# Patient Record
Sex: Male | Born: 1985 | Race: White | Hispanic: No | Marital: Single | Smoking: Current every day smoker
Health system: Southern US, Community
[De-identification: ages and names within clinical notes are randomized; demographics above are authoritative.]

## PROBLEM LIST (undated history)

## (undated) ENCOUNTER — Emergency Department (HOSPITAL_COMMUNITY): Admission: EM | Payer: Self-pay | Source: Home / Self Care

## (undated) DIAGNOSIS — S065XAA Traumatic subdural hemorrhage with loss of consciousness status unknown, initial encounter: Secondary | ICD-10-CM

## (undated) DIAGNOSIS — R569 Unspecified convulsions: Secondary | ICD-10-CM

## (undated) DIAGNOSIS — F419 Anxiety disorder, unspecified: Secondary | ICD-10-CM

## (undated) DIAGNOSIS — F209 Schizophrenia, unspecified: Secondary | ICD-10-CM

## (undated) DIAGNOSIS — F1010 Alcohol abuse, uncomplicated: Secondary | ICD-10-CM

## (undated) DIAGNOSIS — S065X9A Traumatic subdural hemorrhage with loss of consciousness of unspecified duration, initial encounter: Secondary | ICD-10-CM

## (undated) DIAGNOSIS — F4310 Post-traumatic stress disorder, unspecified: Secondary | ICD-10-CM

## (undated) DIAGNOSIS — F319 Bipolar disorder, unspecified: Secondary | ICD-10-CM

## (undated) DIAGNOSIS — Z21 Asymptomatic human immunodeficiency virus [HIV] infection status: Secondary | ICD-10-CM

## (undated) DIAGNOSIS — B20 Human immunodeficiency virus [HIV] disease: Secondary | ICD-10-CM

## (undated) DIAGNOSIS — K859 Acute pancreatitis without necrosis or infection, unspecified: Secondary | ICD-10-CM

## (undated) HISTORY — PX: OTHER SURGICAL HISTORY: SHX169

---

## 2005-07-15 ENCOUNTER — Other Ambulatory Visit: Payer: Self-pay

## 2005-07-15 ENCOUNTER — Ambulatory Visit: Payer: Self-pay | Admitting: Pediatrics

## 2005-12-08 ENCOUNTER — Inpatient Hospital Stay: Payer: Self-pay | Admitting: Internal Medicine

## 2005-12-08 ENCOUNTER — Other Ambulatory Visit: Payer: Self-pay

## 2013-05-04 ENCOUNTER — Emergency Department: Payer: Self-pay | Admitting: Unknown Physician Specialty
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
## 2014-09-26 ENCOUNTER — Emergency Department: Payer: Self-pay | Admitting: Internal Medicine

## 2014-09-26 LAB — URINALYSIS, COMPLETE
Bacteria: NONE SEEN
Bilirubin,UR: NEGATIVE
Blood: NEGATIVE
Glucose,UR: NEGATIVE mg/dL (ref 0–75)
Ketone: NEGATIVE
Leukocyte Esterase: NEGATIVE
Nitrite: NEGATIVE
Ph: 7 (ref 4.5–8.0)
Protein: NEGATIVE
RBC,UR: NONE SEEN /HPF (ref 0–5)
Specific Gravity: 1.001 (ref 1.003–1.030)
Squamous Epithelial: <1
WBC UR: NONE SEEN /HPF (ref 0–5)

## 2014-09-26 LAB — COMPREHENSIVE METABOLIC PANEL
Albumin: 4.2 g/dL (ref 3.4–5.0)
Alkaline Phosphatase: 55 U/L
Anion Gap: 9 (ref 7–16)
BUN: 6 mg/dL — ABNORMAL LOW (ref 7–18)
Bilirubin,Total: 0.5 mg/dL (ref 0.2–1.0)
Calcium, Total: 8.6 mg/dL (ref 8.5–10.1)
Chloride: 102 mmol/L (ref 98–107)
Co2: 27 mmol/L (ref 21–32)
Creatinine: 0.71 mg/dL (ref 0.60–1.30)
EGFR (African American): >60
EGFR (Non-African Amer.): >60
Glucose: 104 mg/dL — ABNORMAL HIGH (ref 65–99)
Osmolality: 274 (ref 275–301)
Potassium: 3.9 mmol/L (ref 3.5–5.1)
SGOT(AST): 48 U/L — ABNORMAL HIGH (ref 15–37)
SGPT (ALT): 64 U/L — ABNORMAL HIGH
Sodium: 138 mmol/L (ref 136–145)
Total Protein: 7.6 g/dL (ref 6.4–8.2)

## 2014-09-26 LAB — CBC
HCT: 43.5 % (ref 40.0–52.0)
HGB: 14.7 g/dL (ref 13.0–18.0)
MCH: 33.4 pg (ref 26.0–34.0)
MCHC: 33.9 g/dL (ref 32.0–36.0)
MCV: 99 fL (ref 80–100)
Platelet: 182 10*3/uL (ref 150–440)
RBC: 4.41 10*6/uL (ref 4.40–5.90)
RDW: 13.6 % (ref 11.5–14.5)
WBC: 5 10*3/uL (ref 3.8–10.6)

## 2014-09-26 LAB — TROPONIN I
Troponin-I: <0.02 ng/mL
Troponin-I: <0.02 ng/mL

## 2014-09-26 LAB — DRUG SCREEN, URINE

## 2014-09-26 LAB — ETHANOL: Ethanol: 122 mg/dL

## 2014-09-26 LAB — SALICYLATE LEVEL: Salicylates, Serum: 2.1 mg/dL

## 2014-09-26 LAB — ACETAMINOPHEN LEVEL: Acetaminophen: <2 ug/mL

## 2014-09-26 LAB — TSH: Thyroid Stimulating Horm: 1.37 u[IU]/mL

## 2014-11-15 ENCOUNTER — Emergency Department: Payer: Self-pay | Admitting: Emergency Medicine

## 2014-11-15 LAB — URINALYSIS, COMPLETE
Bacteria: NONE SEEN
Bilirubin,UR: NEGATIVE
Blood: NEGATIVE
Glucose,UR: NEGATIVE mg/dL (ref 0–75)
Ketone: NEGATIVE
Leukocyte Esterase: NEGATIVE
Nitrite: NEGATIVE
Ph: 6 (ref 4.5–8.0)
Protein: NEGATIVE
RBC,UR: NONE SEEN /HPF (ref 0–5)
Specific Gravity: 1.002 (ref 1.003–1.030)
Squamous Epithelial: NONE SEEN
WBC UR: NONE SEEN /HPF (ref 0–5)

## 2014-11-15 LAB — ETHANOL: Ethanol: 309 mg/dL

## 2014-11-15 LAB — DRUG SCREEN, URINE

## 2014-11-15 LAB — COMPREHENSIVE METABOLIC PANEL
Albumin: 4.7 g/dL (ref 3.4–5.0)
Alkaline Phosphatase: 64 U/L
Anion Gap: 8 (ref 7–16)
BUN: 6 mg/dL — ABNORMAL LOW (ref 7–18)
Bilirubin,Total: 0.4 mg/dL (ref 0.2–1.0)
Calcium, Total: 9.4 mg/dL (ref 8.5–10.1)
Chloride: 100 mmol/L (ref 98–107)
Co2: 30 mmol/L (ref 21–32)
Creatinine: 0.85 mg/dL (ref 0.60–1.30)
EGFR (African American): >60
EGFR (Non-African Amer.): >60
Glucose: 115 mg/dL — ABNORMAL HIGH (ref 65–99)
Osmolality: 274 (ref 275–301)
Potassium: 4.1 mmol/L (ref 3.5–5.1)
SGOT(AST): 42 U/L — ABNORMAL HIGH (ref 15–37)
SGPT (ALT): 61 U/L
Sodium: 138 mmol/L (ref 136–145)
Total Protein: 8.6 g/dL — ABNORMAL HIGH (ref 6.4–8.2)

## 2014-11-15 LAB — CBC
HCT: 50.5 % (ref 40.0–52.0)
HGB: 16.8 g/dL (ref 13.0–18.0)
MCH: 33.5 pg (ref 26.0–34.0)
MCHC: 33.4 g/dL (ref 32.0–36.0)
MCV: 100 fL (ref 80–100)
Platelet: 319 10*3/uL (ref 150–440)
RBC: 5.03 10*6/uL (ref 4.40–5.90)
RDW: 13.8 % (ref 11.5–14.5)
WBC: 7 10*3/uL (ref 3.8–10.6)

## 2014-11-15 LAB — TSH: Thyroid Stimulating Horm: 2.34 u[IU]/mL

## 2014-11-15 LAB — ACETAMINOPHEN LEVEL: Acetaminophen: <2 ug/mL

## 2014-11-15 LAB — SALICYLATE LEVEL: Salicylates, Serum: 2.5 mg/dL

## 2015-09-29 ENCOUNTER — Encounter: Payer: Self-pay | Admitting: *Deleted

## 2015-09-29 ENCOUNTER — Emergency Department
Admission: EM | Admit: 2015-09-29 | Discharge: 2015-09-29 | Disposition: A | Discharge status: Home / Self Care | Payer: Self-pay | Attending: Emergency Medicine | Admitting: Emergency Medicine

## 2015-09-29 ENCOUNTER — Emergency Department: Payer: Self-pay

## 2015-09-29 DIAGNOSIS — J029 Acute pharyngitis, unspecified: Secondary | ICD-10-CM | POA: Insufficient documentation

## 2015-09-29 DIAGNOSIS — F17210 Nicotine dependence, cigarettes, uncomplicated: Secondary | ICD-10-CM | POA: Insufficient documentation

## 2015-09-29 IMAGING — CR DG NECK SOFT TISSUE
1 series · 2 of 2 positions shown · non-contrast
Comparison: None.

CLINICAL DATA: Sore throat for 5 days.

EXAM:
NECK SOFT TISSUES - 1+ VIEW

[Series 1: dg neck soft tissue · 0.14mm/px · 2 of 2 slices shown]
[im 1/2]
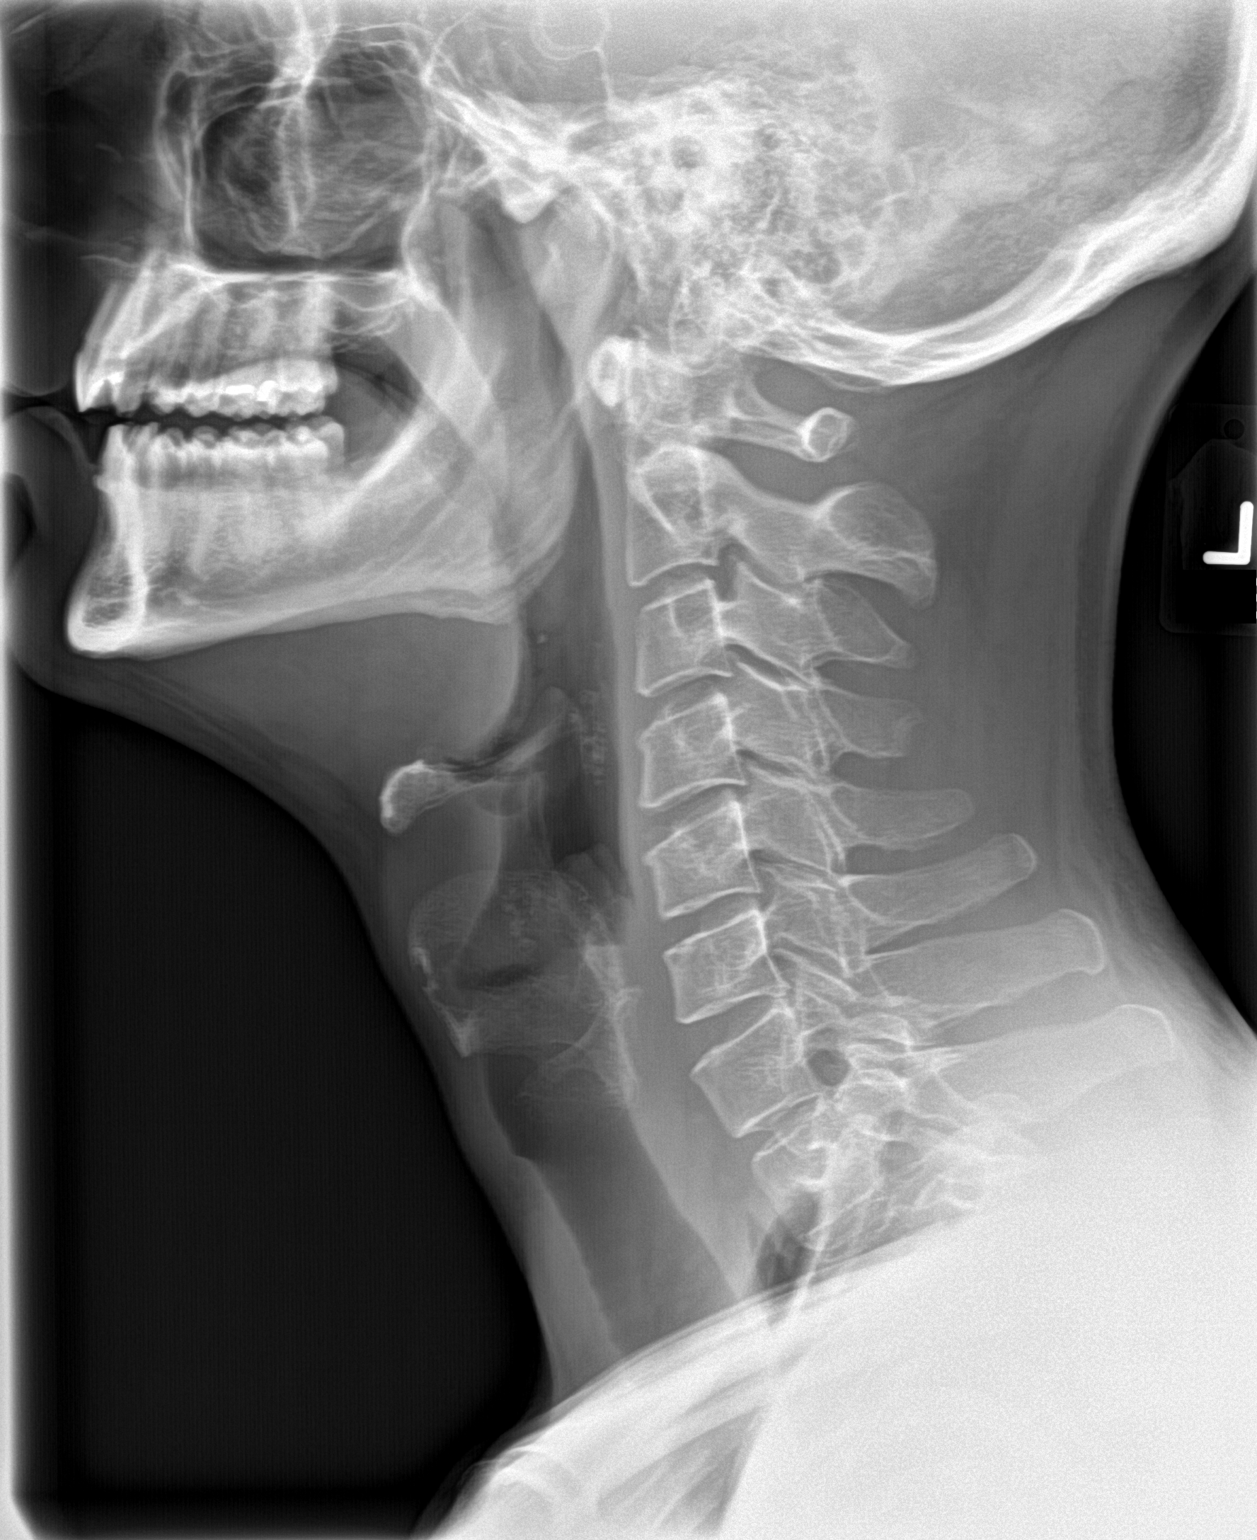
[im 2/2]
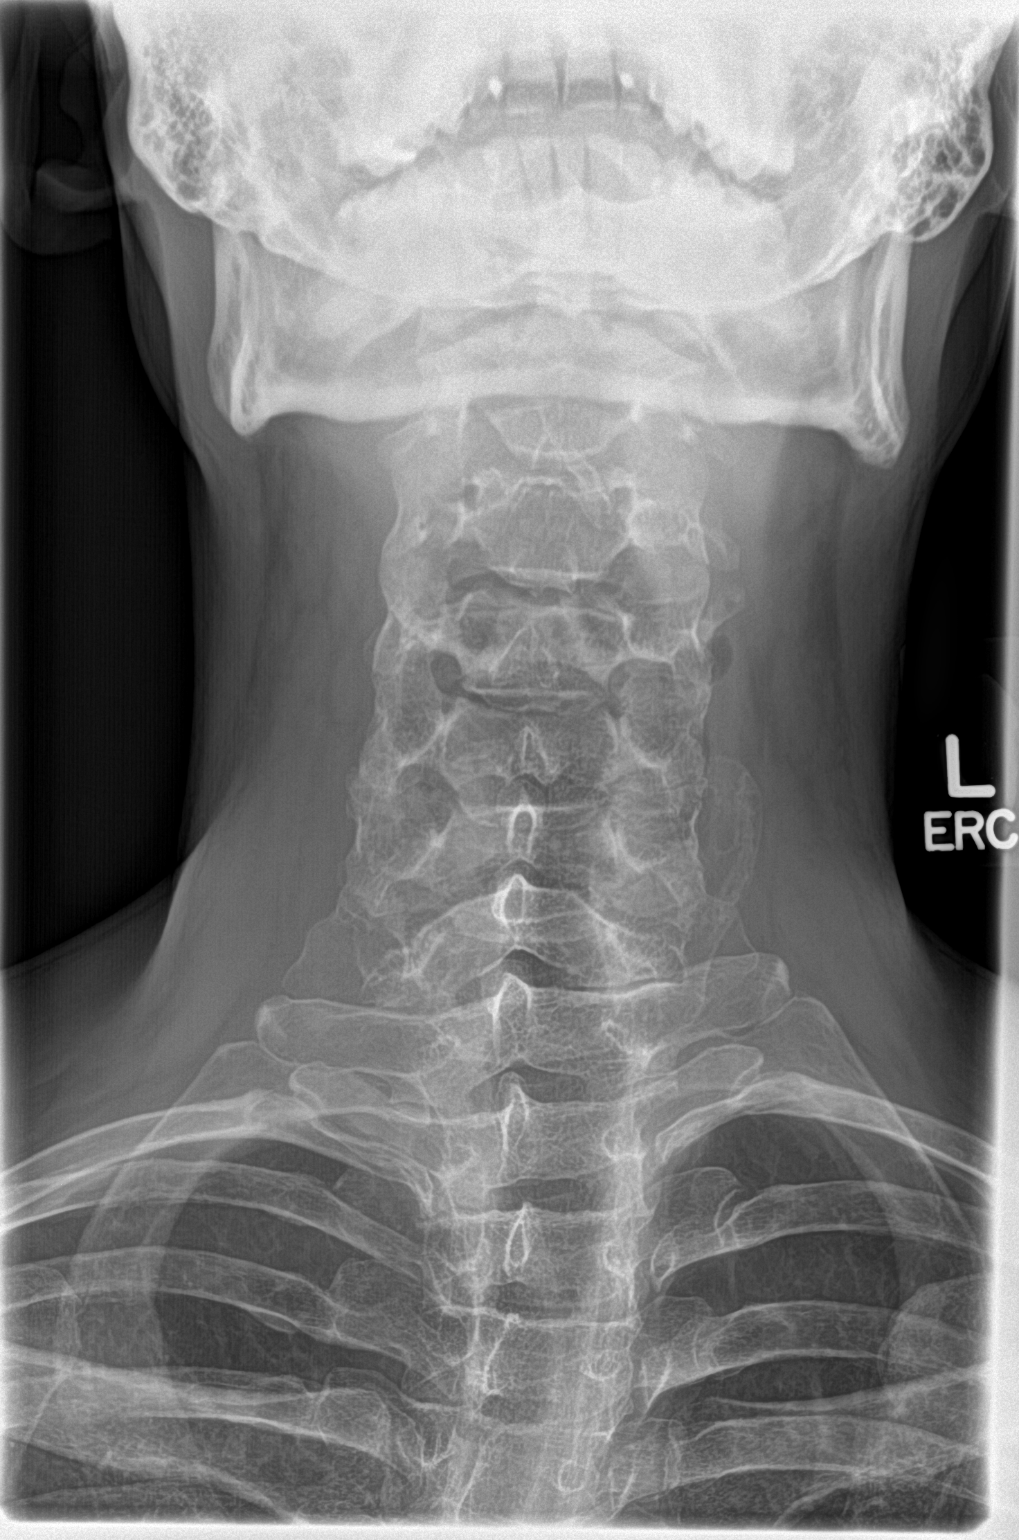

[2 of 2 positions shown; findings below may reference images not displayed]

FINDINGS: The prevertebral soft tissues are normal. The epiglottis,
aryepiglottic folds and adenoid tissue appear unremarkable. No
foreign bodies or airway compromise identified. No significant
osseous findings are demonstrated.
IMPRESSION: Negative soft tissues of the neck.

## 2015-09-29 MED ORDER — AMOXICILLIN 500 MG PO TABS: 500.0000 mg | ORAL_TABLET | Freq: Two times a day (BID) | ORAL | Status: DC

## 2015-09-29 MED ORDER — LIDOCAINE HCL (PF) 1 % IJ SOLN
INTRAMUSCULAR | Status: AC
  Administered 2015-09-29: 2 mL
  Filled 2015-09-29: qty 5

## 2015-09-29 MED ORDER — LIDOCAINE HCL (PF) 1 % IJ SOLN
2.0000 mL | Freq: Once | INTRAMUSCULAR | Status: AC
  Administered 2015-09-29: 2 mL

## 2015-09-29 MED ORDER — LIDOCAINE VISCOUS 2 % MT SOLN: 20.0000 mL | OROMUCOSAL | Status: DC | PRN

## 2015-09-29 MED ORDER — CEFTRIAXONE SODIUM 1 G IJ SOLR
500.0000 mg | Freq: Once | INTRAMUSCULAR | Status: AC
  Administered 2015-09-29: 500 mg via INTRAMUSCULAR
  Filled 2015-09-29: qty 10

## 2015-09-29 NOTE — ED Provider Notes (Signed)
[facility] [place] Emergency Department Provider Note  ____________________________________________  Time seen: Approximately 6:41 PM  I have reviewed the triage vital signs and the nursing notes.   HISTORY  Chief Complaint Sore Throat   HPI Gary [name] is a 29 y.o. male who presents for evaluation of sore throat for the past 5 days. Patient has increased pain and difficulty swallowing. POCTstrep test was negative.   History reviewed. No pertinent past medical history.  There are no active problems to display for this patient.   History reviewed. No pertinent past surgical history.  Current Outpatient Rx  Name  Route  Sig  Dispense  Refill  . amoxicillin (AMOXIL) 500 MG tablet   Oral   Take 1 tablet (500 mg total) by mouth 2 (two) times daily.   20 tablet   0   . lidocaine (XYLOCAINE) 2 % solution   Mouth/Throat   Use as directed 20 mLs in the mouth or throat as needed for mouth pain.   100 mL   0     Allergies Review of patient's allergies indicates not on file.  History reviewed. No pertinent family history.  Social History Social History  Substance Use Topics  . Smoking status: Current Every Day Smoker -- 0.50 packs/day    Types: Cigarettes  . Smokeless tobacco: None  . Alcohol Use: Yes     Comment: socially    Review of Systems Constitutional: No fever/chills Eyes: No visual changes. ENT: Positive sore throat. Cardiovascular: Denies chest pain. Respiratory: Denies shortness of breath. Gastrointestinal: No abdominal pain.  No nausea, no vomiting.  No diarrhea.  No constipation. Genitourinary: Negative for dysuria. Musculoskeletal: Negative for back pain. Skin: Negative for rash. Neurological: Negative for headaches, focal weakness or numbness.  10-point ROS otherwise negative.  ____________________________________________   PHYSICAL EXAM:  VITAL SIGNS: ED Triage Vitals  Enc Vitals Group     BP 09/29/15 1829 110/62  mmHg     Pulse Rate 09/29/15 1829 92     Resp 09/29/15 1829 16     Temp 09/29/15 1829 98.6 F (37 C)     Temp Source 09/29/15 1829 Oral     SpO2 09/29/15 1829 100 %     Weight 09/29/15 1829 150 lb (68.04 kg)     Height 09/29/15 1829  (1.854 m)     Head Cir --      Peak Flow --      Pain Score 09/29/15 1831 8     Pain Loc --      Pain Edu? --      Excl. in GC? --     Constitutional: Alert and oriented. Well appearing and in no acute distress. Eyes: Conjunctivae are normal. PERRL. EOMI. Head: Atraumatic. Nose: No congestion/rhinnorhea. Mouth/Throat: Mucous membranes are moist.  Oropharynx is extremely erythematous with swollen tonsillar edema. Neck: No stridor.  Positive cervical adenopathy left worse than right. Cardiovascular: Normal rate, regular rhythm. Grossly normal heart sounds.  Good peripheral circulation. Respiratory: Normal respiratory effort.  No retractions. Lungs CTAB. Musculoskeletal: No lower extremity tenderness nor edema.  No joint effusions. Neurologic:  Normal speech and language. No gross focal neurologic deficits are appreciated. No gait instability. Skin:  Skin is warm, dry and intact. No rash noted. Psychiatric: Mood and affect are normal. Speech and behavior are normal.  ____________________________________________   LABS (all labs ordered are listed, but only abnormal results are displayed)  Labs Reviewed  CULTURE, GROUP A STREP ([facility] ONLY)  PROCEDURES  Procedure(s) performed: None  Critical Care performed: No  ____________________________________________   INITIAL IMPRESSION / ASSESSMENT AND PLAN / ED COURSE  Pertinent labs & imaging results that were available during my care of the patient were reviewed by me and considered in my medical decision making (see chart for details).  Acute pharyngitis. Rx given for Amoxil 500 mg 3 times a day and viscous lidocaine as needed. Patient follow-up with PCP or return to the ER with any  worsening symptomology. Patient voices no other emergency medical complaints at this time. Radiological x-rays were negative for soft tissue the neck. ____________________________________________   FINAL CLINICAL IMPRESSION(S) / ED DIAGNOSES  Final diagnoses:  Acute pharyngitis, unspecified etiology      Evangeline DakinCharles M Beers, PA-C 09/29/15 1922  Phineas SemenGraydon Goodman, MD 09/29/15 2000

## 2015-09-29 NOTE — ED Notes (Signed)
Pt reports sore throat for the past 5 days with increased pain to swallow or eat.

## 2015-09-29 NOTE — Discharge Instructions (Signed)
Pharyngitis Pharyngitis is redness, pain, and swelling (inflammation) of your pharynx.  CAUSES  Pharyngitis is usually caused by infection. Most of the time, these infections are from viruses (viral) and are part of a cold. However, sometimes pharyngitis is caused by bacteria (bacterial). Pharyngitis can also be caused by allergies. Viral pharyngitis may be spread from person to person by coughing, sneezing, and personal items or utensils (cups, forks, spoons, toothbrushes). Bacterial pharyngitis may be spread from person to person by more intimate contact, such as kissing.  SIGNS AND SYMPTOMS  Symptoms of pharyngitis include:   Sore throat.   Tiredness (fatigue).   Low-grade fever.   Headache.  Joint pain and muscle aches.  Skin rashes.  Swollen lymph nodes.  Plaque-like film on throat or tonsils (often seen with bacterial pharyngitis). DIAGNOSIS  Your health care provider will ask you questions about your illness and your symptoms. Your medical history, along with a physical exam, is often all that is needed to diagnose pharyngitis. Sometimes, a rapid strep test is done. Other lab tests may also be done, depending on the suspected cause.  TREATMENT  Viral pharyngitis will usually get better in 3-4 days without the use of medicine. Bacterial pharyngitis is treated with medicines that kill germs (antibiotics).  HOME CARE INSTRUCTIONS   Drink enough water and fluids to keep your urine clear or pale yellow.   Only take over-the-counter or prescription medicines as directed by your health care provider:   If you are prescribed antibiotics, make sure you finish them even if you start to feel better.   Do not take aspirin.   Get lots of rest.   Gargle with 8 oz of salt water ( tsp of salt per 1 qt of water) as often as every 1-2 hours to soothe your throat.   Throat lozenges (if you are not at risk for choking) or sprays may be used to soothe your throat. SEEK MEDICAL  CARE IF:   You have large, tender lumps in your neck.  You have a rash.  You cough up green, yellow-brown, or bloody spit. SEEK IMMEDIATE MEDICAL CARE IF:   Your neck becomes stiff.  You drool or are unable to swallow liquids.  You vomit or are unable to keep medicines or liquids down.  You have severe pain that does not go away with the use of recommended medicines.  You have trouble breathing (not caused by a stuffy nose). MAKE SURE YOU:   Understand these instructions.  Will watch your condition.  Will get help right away if you are not doing well or get worse.   This information is not intended to replace advice given to you by your health care provider. Make sure you discuss any questions you have with your health care provider.   Document Released: 10/26/2005 Document Revised: 08/16/2013 Document Reviewed: 07/03/2013 Elsevier Interactive Patient Education 2016 Elsevier Inc.  

## 2015-09-29 NOTE — ED Notes (Signed)
POCT Strep resulted NEGATIVE

## 2015-10-02 LAB — CULTURE, GROUP A STREP (THRC)

## 2015-10-07 ENCOUNTER — Encounter: Payer: Self-pay | Admitting: Medical Oncology

## 2015-10-07 ENCOUNTER — Emergency Department
Admission: EM | Admit: 2015-10-07 | Discharge: 2015-10-07 | Disposition: A | Discharge status: Home / Self Care | Payer: Self-pay | Attending: Emergency Medicine | Admitting: Emergency Medicine

## 2015-10-07 DIAGNOSIS — F17210 Nicotine dependence, cigarettes, uncomplicated: Secondary | ICD-10-CM | POA: Insufficient documentation

## 2015-10-07 DIAGNOSIS — Z792 Long term (current) use of antibiotics: Secondary | ICD-10-CM | POA: Insufficient documentation

## 2015-10-07 DIAGNOSIS — K611 Rectal abscess: Secondary | ICD-10-CM | POA: Insufficient documentation

## 2015-10-07 MED ORDER — OXYCODONE-ACETAMINOPHEN 5-325 MG PO TABS: 1.0000 | ORAL_TABLET | ORAL | Status: DC | PRN

## 2015-10-07 MED ORDER — SULFAMETHOXAZOLE-TRIMETHOPRIM 800-160 MG PO TABS: 1.0000 | ORAL_TABLET | Freq: Two times a day (BID) | ORAL | Status: DC

## 2015-10-07 MED ORDER — OXYCODONE-ACETAMINOPHEN 5-325 MG PO TABS
2.0000 | ORAL_TABLET | Freq: Once | ORAL | Status: AC
  Administered 2015-10-07: 2 via ORAL
  Filled 2015-10-07: qty 2

## 2015-10-07 MED ORDER — LIDOCAINE HCL (PF) 1 % IJ SOLN
5.0000 mL | Freq: Once | INTRAMUSCULAR | Status: DC
  Filled 2015-10-07: qty 5

## 2015-10-07 NOTE — ED Notes (Signed)
Pt reports that he has been having hemorrhoidal pain x 5 days, denies bleeding. Has tried preparation H at home without relief.

## 2015-10-07 NOTE — ED Provider Notes (Signed)
[facility] Emergency Department Provider Note  ____________________________________________  Time seen: Approximately 9:01 AM  I have reviewed the triage vital signs and the nursing notes.   HISTORY  Chief Complaint Hemorrhoids  HPI Gary [name] is a 29 y.o. male is here with complaint of perirectal pain for approximately 5 days. Patient states that after talking with some people he believes he has hemorrhoids. He has been using Preparation H without any relief. Today it is unbearable in pain which also increases when sitting down. Patient is unaware of any fever, chills, nausea or vomiting. Patient states he doesn't normally have a history of constipation. He denies any straining recently. He has not seen any blood in the toilet.He rates his pain as a 10 out of 10, it increases with walking and sitting down and lying on his side. So far he is unable to find anything that helps his pain. He has been soaking in tubs of water occasionally which helps only while he is in the water.   History reviewed. No pertinent past medical history.  There are no active problems to display for this patient.   History reviewed. No pertinent past surgical history.  Current Outpatient Rx  Name  Route  Sig  Dispense  Refill  . amoxicillin (AMOXIL) 500 MG tablet   Oral   Take 1 tablet (500 mg total) by mouth 2 (two) times daily.   20 tablet   0   . lidocaine (XYLOCAINE) 2 % solution   Mouth/Throat   Use as directed 20 mLs in the mouth or throat as needed for mouth pain.   100 mL   0   . oxyCODONE-acetaminophen (PERCOCET) 5-325 MG tablet   Oral   Take 1-2 tablets by mouth every 4 (four) hours as needed for severe pain.   30 tablet   0   . sulfamethoxazole-trimethoprim (BACTRIM DS,SEPTRA DS) 800-160 MG tablet   Oral   Take 1 tablet by mouth 2 (two) times daily.   20 tablet   0     Allergies Review of patient's allergies indicates no known allergies.  No  family history on file.  Social History Social History  Substance Use Topics  . Smoking status: Current Every Day Smoker -- 0.50 packs/day    Types: Cigarettes  . Smokeless tobacco: None  . Alcohol Use: Yes     Comment: socially    Review of Systems Constitutional: No fever/chills  Cardiovascular: Denies chest pain. Respiratory: Denies shortness of breath. Gastrointestinal:   No nausea, no vomiting. Genitourinary: Negative for dysuria. Musculoskeletal: Negative for back pain. Skin: Negative for rash. Positive perirectal tenderness. Neurological: Negative for headaches, focal weakness or numbness.  10-point ROS otherwise negative.  ____________________________________________   PHYSICAL EXAM:  VITAL SIGNS: ED Triage Vitals  Enc Vitals Group     BP 10/07/15 0801 117/68 mmHg     Pulse Rate 10/07/15 0801 91     Resp 10/07/15 0801 18     Temp 10/07/15 0801 98.4 F (36.9 C)     Temp Source 10/07/15 0801 Oral     SpO2 10/07/15 0801 97 %     Weight 10/07/15 0801 150 lb (68.04 kg)     Height 10/07/15 0801 6\' 1"  (1.854 m)     Head Cir --      Peak Flow --      Pain Score 10/07/15 0802 10     Pain Loc --      Pain Edu? --  Excl. in GC? --     Constitutional: Alert and oriented. Well appearing and in no acute distress. Eyes: Conjunctivae are normal. PERRL. EOMI. Head: Atraumatic. Nose: No congestion/rhinnorhea. Neck: No stridor.   Cardiovascular: Normal rate, regular rhythm. Grossly normal heart sounds.  Good peripheral circulation. Respiratory: Normal respiratory effort.  No retractions. Lungs CTAB. Gastrointestinal: Soft and nontender. No distention. Rectal exam there is a 2 cm size extremely tender nodule with erythema and fluctuance on the right medial aspect of the buttocks. Musculoskeletal: No lower extremity tenderness nor edema.  No joint effusions. Neurologic:  Normal speech and language. No gross focal neurologic deficits are appreciated. No gait  instability. Skin:  Skin is warm, dry and intact. See abscess above Psychiatric: Mood and affect are normal. Speech and behavior are normal.  ____________________________________________   LABS (all labs ordered are listed, but only abnormal results are displayed)  Labs Reviewed - No data to display  PROCEDURES  Procedure(s) performed: INCISION AND DRAINAGE Performed by: Tommi Rumps Consent: Verbal consent obtained. Risks and benefits: risks, benefits and alternatives were discussed Type: abscess  Body area: Right medial buttocks  Anesthesia: local infiltration  Incision was made with a scalpel.  Local anesthetic: lidocaine 1 % without epinephrine  Anesthetic total: 4 ml  Complexity: complex Blunt dissection to break up loculations  Drainage: purulent  Drainage amount: Moderate   Packing material: 1/4 in iodoform gauze  Patient tolerance: Patient tolerated the procedure well with no immediate complications.    Critical Care performed: No  ____________________________________________   INITIAL IMPRESSION / ASSESSMENT AND PLAN / ED COURSE  Pertinent labs & imaging results that were available during my care of the patient were reviewed by me and considered in my medical decision making (see chart for details).  Patient was placed on Percocet and also Bactrim DS for 10 days. Patient is return in 2 days to have packing removed. ____________________________________________   FINAL CLINICAL IMPRESSION(S) / ED DIAGNOSES  Final diagnoses:  Peri-rectal abscess      Tommi Rumps, PA-C 10/07/15 1359  Emily Filbert, MD 10/07/15 [phone]

## 2015-10-07 NOTE — Discharge Instructions (Signed)
Perirectal Abscess An abscess is an infected area that contains a collection of pus. A perirectal abscess is an abscess that is near the opening of the anus or around the rectum. A perirectal abscess can cause a lot of pain, especially during bowel movements. CAUSES This condition is almost always caused by an infection that starts in an anal gland. RISK FACTORS This condition is more likely to develop in:  People with diabetes or inflammatory bowel disease.  People whose body defense system (immune system) is weak.  People who have anal sex.  People who have a sexually transmitted disease (STD).  People who have certain kinds of cancers, such as rectal carcinoma, leukemia, or lymphoma. SYMPTOMS The main symptom of this condition is pain. The pain may be a throbbing pain that gets worse during bowel movements. Other symptoms include:  Fever.  Swelling.  Redness.  Bleeding.  Constipation. DIAGNOSIS The condition is diagnosed with a physical exam. If the abscess is not visible, a health care provider may need to place a finger inside the rectum to find the abscess. Sometimes, imaging tests are done to determine the size and location of the abscess. These tests may include:  An ultrasound.  An MRI.  A CT scan. TREATMENT This condition is usually treated with incision and drainage surgery. Incision and drainage surgery involves making an incision over the abscess to drain the pus. Treatment may also involve antibiotic medicine, pain medicine, stool softeners, or laxatives. HOME CARE INSTRUCTIONS  Take medicines only as directed by your health care provider.  If you were prescribed an antibiotic, finish all of it even if you start to feel better.  To relieve pain, try sitting:  In a warm, shallow bath (sitz bath).  On a heating pad with the setting on low.  On an inflatable donut-shaped cushion.  Follow any diet instructions as directed by your health care  provider.  Keep all follow-up visits as directed by your health care provider. This is important. SEEK MEDICAL CARE IF:  Your abscess is bleeding.  You have pain, swelling, or redness that is getting worse.  You are constipated.  You feel ill.  You have muscle aches or chills.  You have a fever.  Your symptoms return after the abscess has healed.   This information is not intended to replace advice given to you by your health care provider. Make sure you discuss any questions you have with your health care provider.   Document Released: 10/23/2000 Document Revised: 07/17/2015 Document Reviewed: 09/05/2014 Elsevier Interactive Patient Education [employer]2016 Elsevier Inc.   Return to the emergency room in 2 days for packing removal. Begin Bactrim DS twice a day for 10 days for infection. Percocet as directed for severe pain as needed. Call Dr. Tonita CongWoodham for a appointment if further surgery as desired.

## 2015-10-07 NOTE — ED Notes (Signed)
States he feels like he has a hemorrhoid..having pain to rectal area   But denies any blood in stools.

## 2015-10-09 ENCOUNTER — Emergency Department
Admission: EM | Admit: 2015-10-09 | Discharge: 2015-10-09 | Disposition: A | Discharge status: Home / Self Care | Payer: Self-pay | Attending: Emergency Medicine | Admitting: Emergency Medicine

## 2015-10-09 ENCOUNTER — Encounter: Payer: Self-pay | Admitting: *Deleted

## 2015-10-09 DIAGNOSIS — Z5189 Encounter for other specified aftercare: Secondary | ICD-10-CM

## 2015-10-09 DIAGNOSIS — Z792 Long term (current) use of antibiotics: Secondary | ICD-10-CM | POA: Insufficient documentation

## 2015-10-09 DIAGNOSIS — Z4801 Encounter for change or removal of surgical wound dressing: Secondary | ICD-10-CM | POA: Insufficient documentation

## 2015-10-09 DIAGNOSIS — F17210 Nicotine dependence, cigarettes, uncomplicated: Secondary | ICD-10-CM | POA: Insufficient documentation

## 2015-10-09 NOTE — ED Notes (Signed)
States he was seen 2 days ago and had an abscess lanced   Here for packing removal

## 2015-10-09 NOTE — ED Notes (Signed)
Pt was seen two days ago in ER for abscess on rectum, pt is here today for a wound recheck

## 2015-10-09 NOTE — ED Provider Notes (Signed)
[facility] Emergency Department Provider Note  ____________________________________________  Time seen: Approximately 9:38 AM  I have reviewed the triage vital signs and the nursing notes.   HISTORY  Chief Complaint Wound Check    HPI Gary [name] is a 29 y.o. male who presents for evaluation  Rectal abscess wound recheck. Patient states that he has nota new pus or blood draining from the wound site. Presents today for packing change only. No other complaints at this time. Currently taking all his medications as directed.   History reviewed. No pertinent past medical history.  There are no active problems to display for this patient.   History reviewed. No pertinent past surgical history.  Current Outpatient Rx  Name  Route  Sig  Dispense  Refill  . amoxicillin (AMOXIL) 500 MG tablet   Oral   Take 1 tablet (500 mg total) by mouth 2 (two) times daily.   20 tablet   0   . lidocaine (XYLOCAINE) 2 % solution   Mouth/Throat   Use as directed 20 mLs in the mouth or throat as needed for mouth pain.   100 mL   0   . oxyCODONE-acetaminophen (PERCOCET) 5-325 MG tablet   Oral   Take 1-2 tablets by mouth every 4 (four) hours as needed for severe pain.   30 tablet   0   . sulfamethoxazole-trimethoprim (BACTRIM DS,SEPTRA DS) 800-160 MG tablet   Oral   Take 1 tablet by mouth 2 (two) times daily.   20 tablet   0     Allergies Review of patient's allergies indicates no known allergies.  No family history on file.  Social History Social History  Substance Use Topics  . Smoking status: Current Every Day Smoker -- 0.50 packs/day    Types: Cigarettes  . Smokeless tobacco: None  . Alcohol Use: Yes     Comment: socially    Review of Systems Constitutional: No fever/chills Eyes: No visual changes. ENT: No sore throat. Cardiovascular: Denies chest pain. Respiratory: Denies shortness of breath. Gastrointestinal: No abdominal pain.  No  nausea, no vomiting.  No diarrhea.  No constipation. Genitourinary: Negative for dysuria. Musculoskeletal: Negative for back pain. Skin: Positive for perirectal abscess I&D. Neurological: Negative for headaches, focal weakness or numbness.  10-point ROS otherwise negative.  ____________________________________________   PHYSICAL EXAM:  VITAL SIGNS: ED Triage Vitals  Enc Vitals Group     BP 10/09/15 0856 108/63 mmHg     Pulse Rate 10/09/15 0856 86     Resp 10/09/15 0856 20     Temp 10/09/15 0856 98.1 F (36.7 C)     Temp Source 10/09/15 0856 Oral     SpO2 10/09/15 0856 98 %     Weight 10/09/15 0856 150 lb (68.04 kg)     Height 10/09/15 0856  (1.854 m)     Head Cir --      Peak Flow --      Pain Score 10/09/15 0857 5     Pain Loc --      Pain Edu? --      Excl. in GC? --     Constitutional: Alert and oriented. Well appearing and in no acute distress. Cardiovascular: Normal rate, regular rhythm. Grossly normal heart sounds.  Good peripheral circulation. Respiratory: Normal respiratory effort.  No retractions. Lungs CTAB. Gastrointestinal: Soft and nontender. No distention. No abdominal bruits. No CVA tenderness. Musculoskeletal: No lower extremity tenderness nor edema.  No joint effusions. Neurologic:  Normal  speech and language. No gross focal neurologic deficits are appreciated. No gait instability. Skin:  Skin is warm, dry and intact. No rash noted. Packing removed from the perirectal abscess area on examination evidence of erythema or pustular drainage. Minimal bleeding discharge. Psychiatric: Mood and affect are normal. Speech and behavior are normal.  ____________________________________________   LABS (all labs ordered are listed, but only abnormal results are displayed)  Labs Reviewed - No data to display ____________________________________________     PROCEDURES  Procedure(s) performed: None  Critical Care performed:  No  ____________________________________________   INITIAL IMPRESSION / ASSESSMENT AND PLAN / ED COURSE  Pertinent labs & imaging results that were available during my care of the patient were reviewed by me and considered in my medical decision making (see chart for details).  Perirectal abscess healing. No packing was placed today. Patient to continue to keep very clean and follow up with PCP or with surgery as scheduled. Patient denies any other  medical complaints at this time. ____________________________________________   FINAL CLINICAL IMPRESSION(S) / ED DIAGNOSES  Final diagnoses:  Wound check, abscess      Evangeline DakinCharles M Beers, PA-C 10/09/15 1316  Jennye MoccasinBrian S Quigley, MD 10/09/15 1525

## 2016-04-15 ENCOUNTER — Emergency Department: Payer: Self-pay

## 2016-04-15 ENCOUNTER — Emergency Department
Admission: EM | Admit: 2016-04-15 | Discharge: 2016-04-15 | Disposition: A | Discharge status: Home / Self Care | Payer: Self-pay | Attending: Emergency Medicine | Admitting: Emergency Medicine

## 2016-04-15 DIAGNOSIS — F17210 Nicotine dependence, cigarettes, uncomplicated: Secondary | ICD-10-CM | POA: Insufficient documentation

## 2016-04-15 DIAGNOSIS — Z21 Asymptomatic human immunodeficiency virus [HIV] infection status: Secondary | ICD-10-CM | POA: Insufficient documentation

## 2016-04-15 DIAGNOSIS — Z792 Long term (current) use of antibiotics: Secondary | ICD-10-CM | POA: Insufficient documentation

## 2016-04-15 DIAGNOSIS — R002 Palpitations: Secondary | ICD-10-CM | POA: Insufficient documentation

## 2016-04-15 DIAGNOSIS — F1010 Alcohol abuse, uncomplicated: Secondary | ICD-10-CM | POA: Insufficient documentation

## 2016-04-15 DIAGNOSIS — F1290 Cannabis use, unspecified, uncomplicated: Secondary | ICD-10-CM | POA: Insufficient documentation

## 2016-04-15 HISTORY — DX: Asymptomatic human immunodeficiency virus (hiv) infection status: Z21

## 2016-04-15 HISTORY — DX: Human immunodeficiency virus (HIV) disease: B20

## 2016-04-15 LAB — CBC
HCT: 42.7 % (ref 40.0–52.0)
Hemoglobin: 14.9 g/dL (ref 13.0–18.0)
MCH: 32.3 pg (ref 26.0–34.0)
MCHC: 34.8 g/dL (ref 32.0–36.0)
MCV: 92.9 fL (ref 80.0–100.0)
Platelets: 264 10*3/uL (ref 150–440)
RBC: 4.6 MIL/uL (ref 4.40–5.90)
RDW: 15.1 % — ABNORMAL HIGH (ref 11.5–14.5)
WBC: 5.1 10*3/uL (ref 3.8–10.6)

## 2016-04-15 LAB — BASIC METABOLIC PANEL
Anion gap: 12 (ref 5–15)
BUN: 8 mg/dL (ref 6–20)
CO2: 22 mmol/L (ref 22–32)
Calcium: 9.8 mg/dL (ref 8.9–10.3)
Chloride: 98 mmol/L — ABNORMAL LOW (ref 101–111)
Creatinine, Ser: 0.83 mg/dL (ref 0.61–1.24)
GFR calc Af Amer: >60 mL/min (ref >60)
GFR calc non Af Amer: >60 mL/min (ref >60)
Glucose, Bld: 89 mg/dL (ref 65–99)
Potassium: 4 mmol/L (ref 3.5–5.1)
Sodium: 132 mmol/L — ABNORMAL LOW (ref 135–145)

## 2016-04-15 LAB — ETHANOL: Alcohol, Ethyl (B): <5 mg/dL (ref <5)

## 2016-04-15 LAB — TROPONIN I: Troponin I: <0.03 ng/mL (ref <0.031)

## 2016-04-15 LAB — MAGNESIUM: Magnesium: 2 mg/dL (ref 1.7–2.4)

## 2016-04-15 IMAGING — CR DG CHEST 2V
2 series · 2 of 2 positions shown · non-contrast
Comparison: None.

CLINICAL DATA: Left-sided chest pain and shortness of Breath

EXAM:
CHEST  2 VIEW

[chest pa]
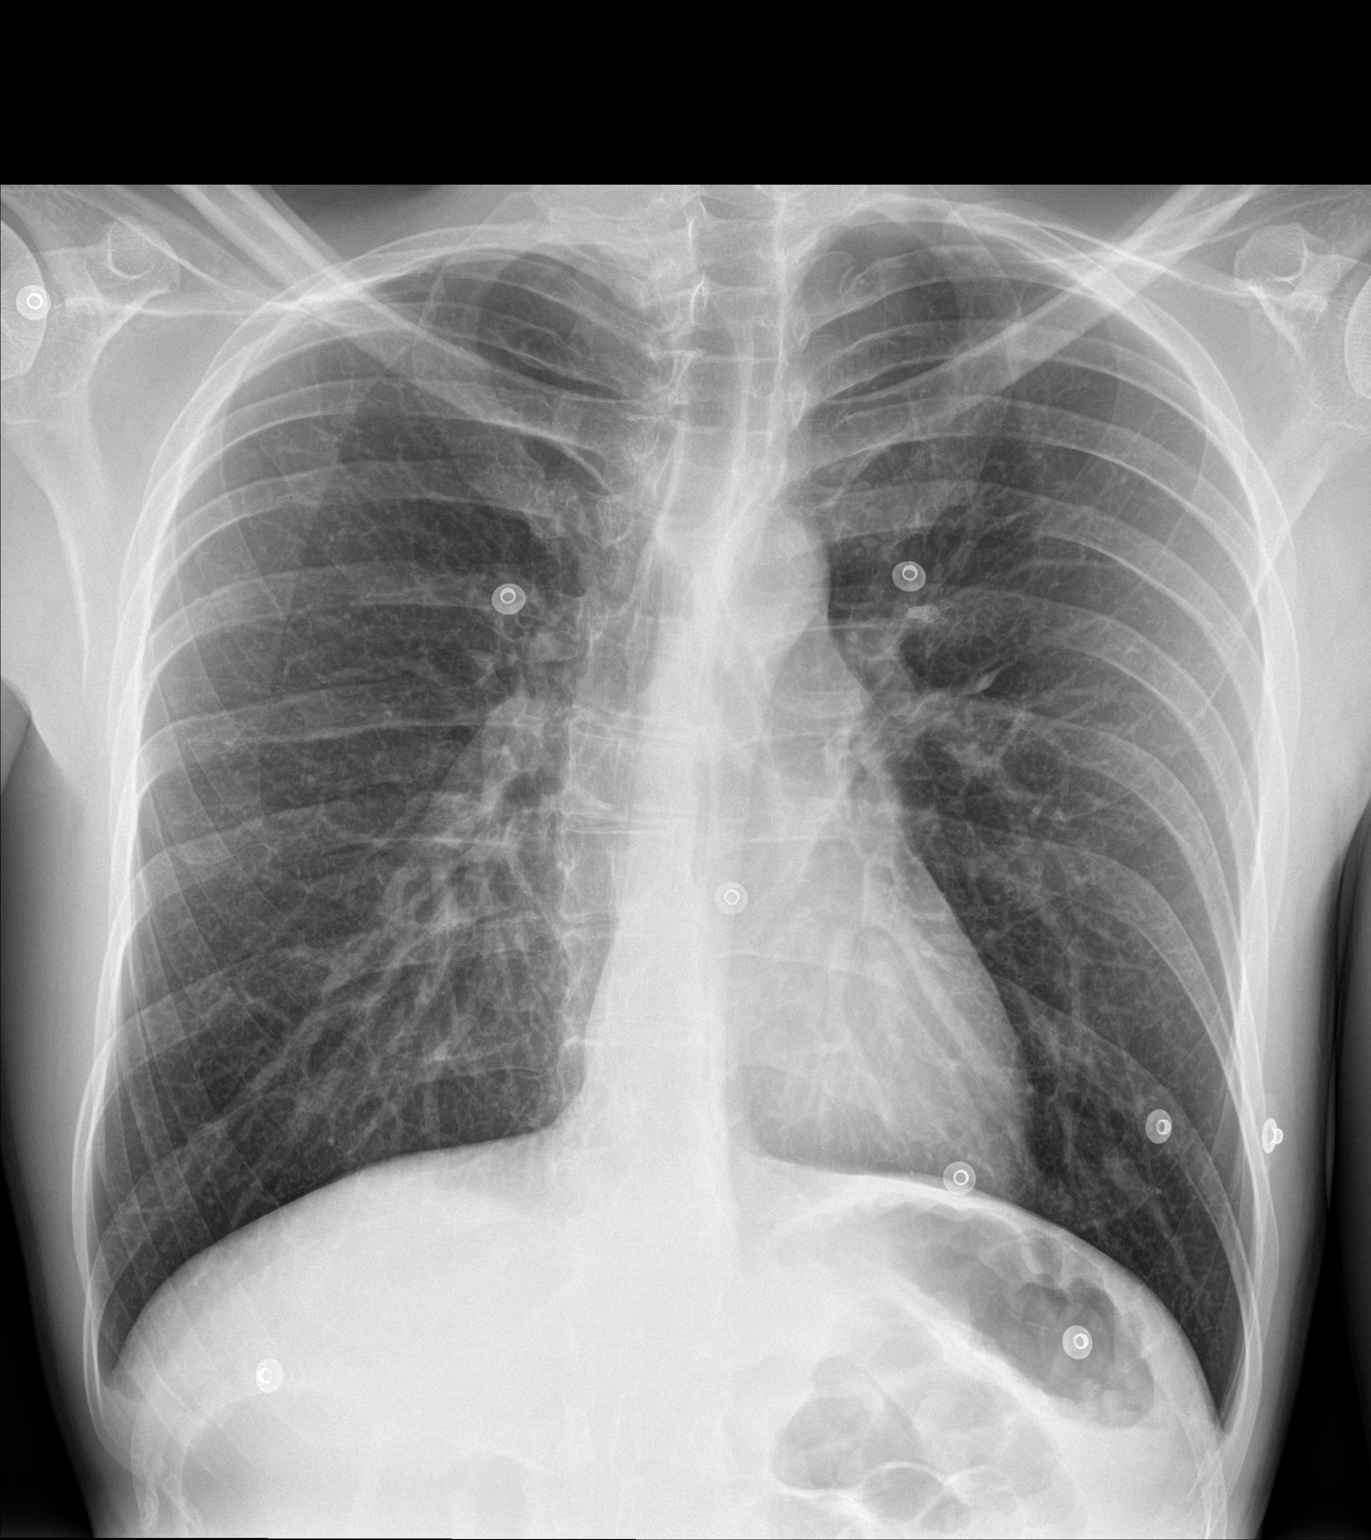

[chest lat]
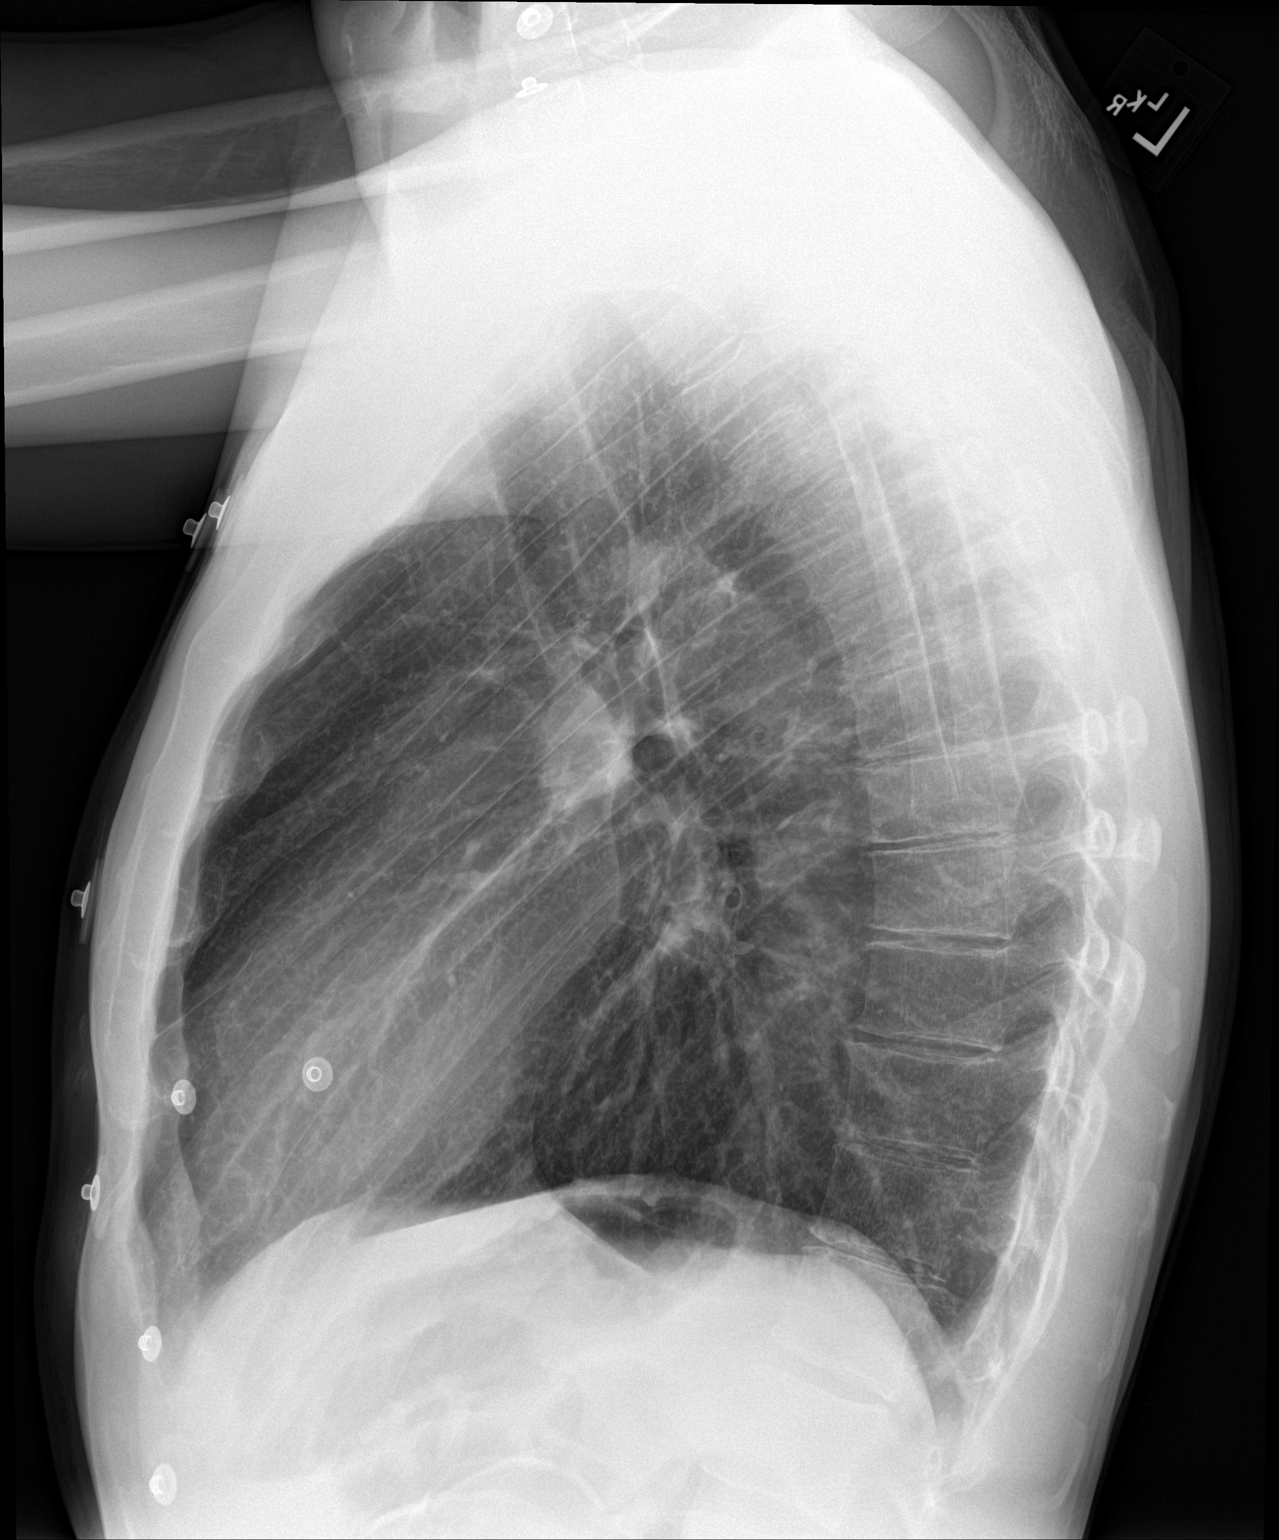

[2 of 2 positions shown; findings below may reference images not displayed]

FINDINGS: The heart size and mediastinal contours are within normal limits.
Both lungs are clear. The visualized skeletal structures are
unremarkable.
IMPRESSION: No active cardiopulmonary disease.

## 2016-04-15 MED ORDER — THIAMINE HCL 100 MG/ML IJ SOLN
100.0000 mg | Freq: Every day | INTRAMUSCULAR | Status: DC
  Administered 2016-04-15: 100 mg via INTRAVENOUS

## 2016-04-15 MED ORDER — LORAZEPAM 2 MG/ML IJ SOLN
INTRAMUSCULAR | Status: AC
  Administered 2016-04-15: 2 mg via INTRAVENOUS
  Filled 2016-04-15: qty 1

## 2016-04-15 MED ORDER — LORAZEPAM 2 MG/ML IJ SOLN
2.0000 mg | Freq: Once | INTRAMUSCULAR | Status: AC
  Administered 2016-04-15: 2 mg via INTRAVENOUS

## 2016-04-15 MED ORDER — SODIUM CHLORIDE 0.9 % IV SOLN
1.0000 mg | Freq: Once | INTRAVENOUS | Status: AC
  Administered 2016-04-15: 1 mg via INTRAVENOUS
  Filled 2016-04-15: qty 0.2

## 2016-04-15 MED ORDER — SODIUM CHLORIDE 0.9 % IV BOLUS (SEPSIS)
1000.0000 mL | Freq: Once | INTRAVENOUS | Status: AC
  Administered 2016-04-15: 1000 mL via INTRAVENOUS

## 2016-04-15 MED ORDER — THIAMINE HCL 100 MG/ML IJ SOLN
INTRAMUSCULAR | Status: AC
  Administered 2016-04-15: 100 mg via INTRAVENOUS
  Filled 2016-04-15: qty 2

## 2016-04-15 NOTE — ED Notes (Signed)
Pt ambulatory to restroom

## 2016-04-15 NOTE — ED Notes (Signed)
Pt arrives to ER via [facility] from home c/o central CP for approx 1 hours. States "I feel like my heart is working really hard". Denies hx. Pt alert and oriented X4, active, cooperative, pt in NAD. RR even and unlabored, color WNL.  324 ASA given by [facility], 18G to L AC started by [facility].

## 2016-04-15 NOTE — ED Provider Notes (Signed)
[facility] Emergency Department Provider Note  ____________________________________________   I have reviewed the triage vital signs and the nursing notes.   HISTORY  Chief Complaint Chest Pain    HPI Gary [name] is a 30 y.o. male with a history of alcohol abuse and HIV with a undetectable viral load and a normal CD4 count. Patient is compliant he states with his anti-virus. Patient states that he does drink a 12 pack of beer every day. He states he hasn't had any for over a day. States he became very anxious about this. He states he believes he may have had a panic attack. He states that his heart was going fast and he felt shaky. He called 911 for that. He denies chest pain or shortness of breath at this time. He states he feels much better. The patient did receive aspirin from EMS. He has no history of early cardiac disease. He denies any chest pain to me states he just felt like his heart was going quickly. He denies any fever or chills. He has had no cough or shortness of breath. He states that he was just very anxious. I have discussed with him whether he feels that he would want to have rehabilitation or a referral and he states now he would prefer to go home and continue drinking. He has no SI or HI. He states that when he decides he is going to stop drinking he will taper himself.He has no history of PE, DVT in himself or his family. He has no leg swelling no recent travel he is not taking any eczematous estrogens obviously and has had no surgery       Past Medical History  Diagnosis Date  . HIV (human immunodeficiency virus infection) (HCC)     There are no active problems to display for this patient.   History reviewed. No pertinent past surgical history.  Current Outpatient Rx  Name  Route  Sig  Dispense  Refill  . amoxicillin (AMOXIL) 500 MG tablet   Oral   Take 1 tablet (500 mg total) by mouth 2 (two) times daily.   20 tablet   0   .  lidocaine (XYLOCAINE) 2 % solution   Mouth/Throat   Use as directed 20 mLs in the mouth or throat as needed for mouth pain.   100 mL   0   . oxyCODONE-acetaminophen (PERCOCET) 5-325 MG tablet   Oral   Take 1-2 tablets by mouth every 4 (four) hours as needed for severe pain.   30 tablet   0   . sulfamethoxazole-trimethoprim (BACTRIM DS,SEPTRA DS) 800-160 MG tablet   Oral   Take 1 tablet by mouth 2 (two) times daily.   20 tablet   0     Allergies Review of patient's allergies indicates no known allergies.  No family history on file.  Social History Social History  Substance Use Topics  . Smoking status: Current Every Day Smoker -- 0.50 packs/day    Types: Cigarettes  . Smokeless tobacco: None  . Alcohol Use: Yes     Comment: socially    Review of Systems Constitutional: No fever/chills Eyes: No visual changes. ENT: No sore throat. No stiff neck no neck pain Cardiovascular: Denies chest pain. Respiratory: Denies shortness of breath. Gastrointestinal:   no vomiting.  No diarrhea.  No constipation. Genitourinary: Negative for dysuria. Musculoskeletal: Negative lower extremity swelling Skin: Negative for rash. Neurological: Negative for headaches, focal weakness or numbness. 10-point ROS otherwise  negative.  ____________________________________________   PHYSICAL EXAM:  VITAL SIGNS: ED Triage Vitals  Enc Vitals Group     BP 04/15/16 1553 112/69 mmHg     Pulse Rate 04/15/16 1553 80     Resp 04/15/16 1553 14     Temp 04/15/16 1553 98.2 F (36.8 C)     Temp Source 04/15/16 1553 Oral     SpO2 04/15/16 1553 100 %     Weight 04/15/16 1553 142 lb (64.411 kg)     Height 04/15/16 1553 6\' 1"  (1.854 m)     Head Cir --      Peak Flow --      Pain Score 04/15/16 1554 8     Pain Loc --      Pain Edu? --      Excl. in GC? --     Constitutional: Alert and oriented. Well appearing and in no acute distress., Somewhat anxious. Eyes: Conjunctivae are normal. PERRL.  EOMI. Head: Atraumatic. Nose: No congestion/rhinnorhea. Mouth/Throat: Mucous membranes are moist.  Oropharynx non-erythematous. Neck: No stridor.   Nontender with no meningismus Cardiovascular: Normal rate, regular rhythm. Grossly normal heart sounds.  Good peripheral circulation. Respiratory: Normal respiratory effort.  No retractions. Lungs CTAB. Abdominal: Soft and nontender. No distention. No guarding no rebound Back:  There is no focal tenderness or step off there is no midline tenderness there are no lesions noted. there is no CVA tenderness Musculoskeletal: No lower extremity tenderness. No joint effusions, no DVT signs strong distal pulses no edema Neurologic:  Normal speech and language. No gross focal neurologic deficits are appreciated.  Skin:  Skin is warm, dry and intact. No rash noted. Psychiatric: Mood and affect are normal. Speech and behavior are normal.  ____________________________________________   LABS (all labs ordered are listed, but only abnormal results are displayed)  Labs Reviewed  BASIC METABOLIC PANEL - Abnormal; Notable for the following:    Sodium 132 (*)    Chloride 98 (*)    All other components within normal limits  CBC - Abnormal; Notable for the following:    RDW 15.1 (*)    All other components within normal limits  TROPONIN I  ETHANOL  MAGNESIUM   ____________________________________________  EKG  I personally interpreted any EKGs ordered by me or triage Normal sinus rhythm rate 93 (burning, no acute ST elevation or depression of severe sutures normal axis, RSR prime noted. ____________________________________________  RADIOLOGY  I reviewed any imaging ordered by me or triage that were performed during my shift and, if possible, patient and/or family made aware of any abnormal findings. ____________________________________________   PROCEDURES  Procedure(s) performed: None  Critical Care performed:  None  ____________________________________________   INITIAL IMPRESSION / ASSESSMENT AND PLAN / ED COURSE  Pertinent labs & imaging results that were available during my care of the patient were reviewed by me and considered in my medical decision making (see chart for details).  Patient states he was feeling anxious. He is not in florid withdrawal at this time is alert and oriented. He does not have any evidence of significant tremor or withdrawal symptoms. I have offered him admission and he prefers to go home. He is not driving. I have offered him referral for detox and he declines. Patient has no obvious ongoing medical issues at this time. At this time, there does not appear to be clinical evidence to support the diagnosis of pulmonary embolus, dissection, myocarditis, endocarditis, pericarditis, pericardial tamponade, acute coronary syndrome, pneumothorax, pneumonia, or any  other acute intrathoracic pathology that will require admission or acute intervention. Nor is there evidence of any significant intra-abdominal pathology . Given the patient is awake and alert and declines admission we will discharge him with outpatient follow-up strongly recommended.  FINAL CLINICAL IMPRESSION(S) / ED DIAGNOSES  Final diagnoses:  None      This chart was dictated using voice recognition software.  Despite best efforts to proofread,  errors can occur which can change meaning.     Jeanmarie Plant, MD 04/15/16 [phone]

## 2016-04-15 NOTE — Discharge Instructions (Signed)
Alcohol Abuse and Nutrition  Alcohol abuse is any pattern of alcohol consumption that harms your health, relationships, or work. Alcohol abuse can affect how your body breaks down and absorbs nutrients from food by causing your liver to work abnormally. Additionally, many people who abuse alcohol do not eat enough carbohydrates, protein, fat, vitamins, and minerals. This can cause poor nutrition (malnutrition) and a lack of nutrients (nutrient deficiencies), which can lead to further complications.  Nutrients that are commonly lacking (deficient) among people who abuse alcohol include:  · Vitamins.    Vitamin A. This is stored in your liver. It is important for your vision, metabolism, and ability to fight off infections (immunity).    B vitamins. These include vitamins such as folate, thiamin, and niacin. These are important in new cell growth and maintenance.    Vitamin C. This plays an important role in iron absorption, wound healing, and immunity.    Vitamin D. This is produced by your liver, but you can also get vitamin D from food. Vitamin D is necessary for your body to absorb and use calcium.  · Minerals.    Calcium. This is important for your bones and your heart and blood vessel (cardiovascular) function.    Iron. This is important for blood, muscle, and nervous system functioning.    Magnesium. This plays an important role in muscle and nerve function, and it helps to control blood sugar and blood pressure.    Zinc. This is important for the normal function of your nervous system and digestive system (gastrointestinal tract).  Nutrition is an essential component of therapy for alcohol abuse. Your health care provider or dietitian will work with you to design a plan that can help restore nutrients to your body and prevent potential complications.  WHAT IS MY PLAN?  Your dietitian may develop a specific diet plan that is based on your condition and any other complications you may have. A diet plan will  commonly include:  · A balanced diet.    Grains: 6-8 oz per day.    Vegetables: 2-3 cups per day.    Fruits: 1-2 cups per day.    Meat and other protein: 5-6 oz per day.    Dairy: 2-3 cups per day.  · Vitamin and mineral supplements.  WHAT DO I NEED TO KNOW ABOUT ALCOHOL AND NUTRITION?  · Consume foods that are high in antioxidants, such as grapes, berries, nuts, green tea, and dark green and orange vegetables. This can help to counteract some of the stress that is placed on your liver by consuming alcohol.  · Avoid food and drinks that are high in fat and sugar. Foods such as sugared soft drinks, salty snack foods, and candy contain empty calories. This means that they lack important nutrients such as protein, fiber, and vitamins.  · Eat frequent meals and snacks. Try to eat 5-6 small meals each day.  · Eat a variety of fresh fruits and vegetables each day. This will help you get plenty of water, fiber, and vitamins in your diet.  · Drink plenty of water and other clear fluids. Try to drink at least 48-64 oz (1.5-2 L) of water per day.  · If you are a vegetarian, eat a variety of protein-rich foods. Pair whole grains with plant-based proteins at meals and snacks to obtain the greatest nutrient benefit from your food. For example, eat rice with beans, put peanut butter on whole-grain toast, or eat oatmeal with sunflower seeds.  ·   Soak beans and whole grains overnight before cooking. This can help your body to absorb the nutrients more easily.  · Include foods fortified with vitamins and minerals in your diet. Commonly fortified foods include milk, orange juice, cereal, and bread.  · If you are malnourished, your dietitian may recommend a high-protein, high-calorie diet. This may include:    2,000-3,000 calories (kilocalories) per day.    70-100 grams of protein per day.  · Your health care provider may recommend a complete nutritional supplement beverage. This can help to restore calories, protein, and vitamins to  your body. Depending on your condition, you may be advised to consume this instead of or in addition to meals.  · Limit your intake of caffeine. Replace drinks like coffee and black tea with decaffeinated coffee and herbal tea.  · Eat a variety of foods that are high in omega fatty acids. These include fish, nuts and seeds, and soybeans. These foods may help your liver to recover and may also stabilize your mood.  · Certain medicines may cause changes in your appetite, taste, and weight. Work with your health care provider and dietitian to make any adjustments to your medicines and diet plan.  · Include other healthy lifestyle choices in your daily routine.    Be physically active.    Get enough sleep.    Spend time doing activities that you enjoy.  · If you are unable to take in enough food and calories by mouth, your health care provider may recommend a feeding tube. This is a tube that passes through your nose and throat, directly into your stomach. Nutritional supplement beverages can be given to you through the feeding tube to help you get the nutrients you need.  · Take vitamin or mineral supplements as recommended by your health care provider.  WHAT FOODS CAN I EAT?  Grains  Enriched pasta. Enriched rice. Fortified whole-grain bread. Fortified whole-grain cereal. Barley. Brown rice. Quinoa. Millet.  Vegetables  All fresh, frozen, and canned vegetables. Spinach. Kale. Artichoke. Carrots. Winter squash and pumpkin. Sweet potatoes. Broccoli. Cabbage. Cucumbers. Tomatoes. Sweet peppers. Green beans. Peas. Corn.  Fruits  All fresh and frozen fruits. Berries. Grapes. Mango. Papaya. Guava. Cherries. Apples. Bananas. Peaches. Plums. Pineapple. Watermelon. Cantaloupe. Oranges. Avocado.  Meats and Other Protein Sources  Beef liver. Lean beef. Pork. Fresh and canned chicken. Fresh fish. Oysters. Sardines. Canned tuna. Shrimp. Eggs with yolks. Nuts and seeds. Peanut butter. Beans and lentils. Soybeans.  Tofu.  Dairy  Whole, low-fat, and nonfat milk. Whole, low-fat, and nonfat yogurt. Cottage cheese. Sour cream. Hard and soft cheeses.  Beverages  Water. Herbal tea. Decaffeinated coffee. Decaffeinated green tea. 100% fruit juice. 100% vegetable juice. Instant breakfast shakes.  Condiments  Ketchup. Mayonnaise. Mustard. Salad dressing. Barbecue sauce.  Sweets and Desserts  Sugar-free ice cream. Sugar-free pudding. Sugar-free gelatin.  Fats and Oils  Butter. Vegetable oil, flaxseed oil, olive oil, and walnut oil.  Other  Complete nutrition shakes. Protein bars. Sugar-free gum.  The items listed above may not be a complete list of recommended foods or beverages. Contact your dietitian for more options.  WHAT FOODS ARE NOT RECOMMENDED?  Grains  Sugar-sweetened breakfast cereals. Flavored instant oatmeal. Fried breads.  Vegetables  Breaded or deep-fried vegetables.  Fruits  Dried fruit with added sugar. Candied fruit. Canned fruit in syrup.  Meats and Other Protein Sources  Breaded or deep-fried meats.  Dairy  Flavored milks. Fried cheese curds or fried cheese sticks.  Beverages  Alcohol.   Sugar-sweetened soft drinks. Sugar-sweetened tea. Caffeinated coffee and tea.  Condiments  Sugar. Honey. Agave nectar. Molasses.  Sweets and Desserts  Chocolate. Cake. Cookies. Candy.  Other  Potato chips. Pretzels. Salted nuts. Candied nuts.  The items listed above may not be a complete list of foods and beverages to avoid. Contact your dietitian for more information.     This information is not intended to replace advice given to you by your health care provider. Make sure you discuss any questions you have with your health care provider.     Document Released: 08/20/2005 Document Revised: 11/16/2014 Document Reviewed: 05/29/2014  Elsevier Interactive Patient Education ©2016 [employer]

## 2016-04-15 NOTE — ED Notes (Signed)
Discharge instructions reviewed with patient. Questions fielded by this RN. Patient verbalizes understanding of instructions. Patient discharged home in stable condition per Mcshane MD . No acute distress noted at time of discharge.   

## 2016-07-26 ENCOUNTER — Emergency Department
Admission: EM | Admit: 2016-07-26 | Discharge: 2016-07-26 | Disposition: A | Discharge status: Home / Self Care | Payer: Self-pay | Attending: Emergency Medicine | Admitting: Emergency Medicine

## 2016-07-26 ENCOUNTER — Emergency Department: Payer: Self-pay

## 2016-07-26 DIAGNOSIS — F17210 Nicotine dependence, cigarettes, uncomplicated: Secondary | ICD-10-CM | POA: Insufficient documentation

## 2016-07-26 DIAGNOSIS — Z21 Asymptomatic human immunodeficiency virus [HIV] infection status: Secondary | ICD-10-CM | POA: Insufficient documentation

## 2016-07-26 DIAGNOSIS — F10230 Alcohol dependence with withdrawal, uncomplicated: Secondary | ICD-10-CM | POA: Insufficient documentation

## 2016-07-26 DIAGNOSIS — F10930 Alcohol use, unspecified with withdrawal, uncomplicated: Secondary | ICD-10-CM

## 2016-07-26 LAB — TROPONIN I: Troponin I: <0.03 ng/mL (ref <0.03)

## 2016-07-26 LAB — CBC
HCT: 43.1 % (ref 40.0–52.0)
Hemoglobin: 15.1 g/dL (ref 13.0–18.0)
MCH: 34.1 pg — ABNORMAL HIGH (ref 26.0–34.0)
MCHC: 35.1 g/dL (ref 32.0–36.0)
MCV: 97 fL (ref 80.0–100.0)
Platelets: 216 K/uL (ref 150–440)
RBC: 4.44 MIL/uL (ref 4.40–5.90)
RDW: 13.4 % (ref 11.5–14.5)
WBC: 4.8 K/uL (ref 3.8–10.6)

## 2016-07-26 LAB — BASIC METABOLIC PANEL
Anion gap: 8 (ref 5–15)
BUN: 8 mg/dL (ref 6–20)
CO2: 26 mmol/L (ref 22–32)
Calcium: 9 mg/dL (ref 8.9–10.3)
Chloride: 106 mmol/L (ref 101–111)
Creatinine, Ser: 0.65 mg/dL (ref 0.61–1.24)
GFR calc Af Amer: >60 mL/min (ref >60)
GFR calc non Af Amer: >60 mL/min (ref >60)
Glucose, Bld: 98 mg/dL (ref 65–99)
Potassium: 4.2 mmol/L (ref 3.5–5.1)
Sodium: 140 mmol/L (ref 135–145)

## 2016-07-26 LAB — ETHANOL: Alcohol, Ethyl (B): 174 mg/dL — ABNORMAL HIGH (ref <5)

## 2016-07-26 IMAGING — CR DG CHEST 2V
3 series · 3 of 3 positions shown · non-contrast
Comparison: [DATE]

CLINICAL DATA: Chest pain.

EXAM:
CHEST  2 VIEW

[chest lat]
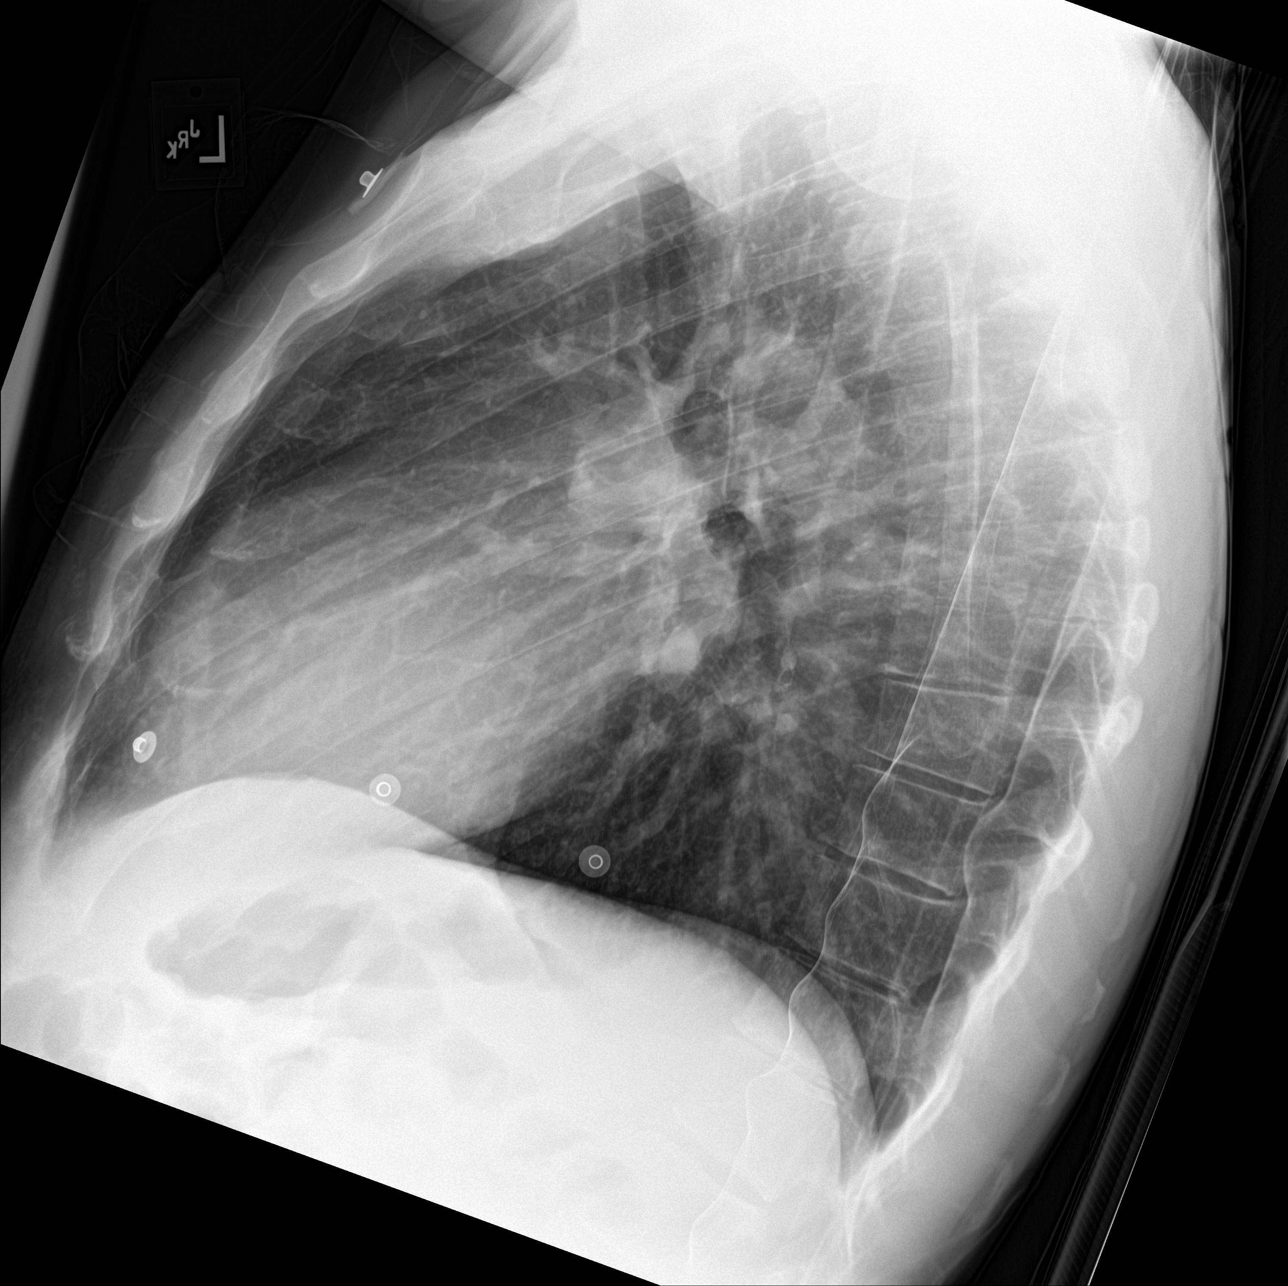

[chest ap (1 of 2)]
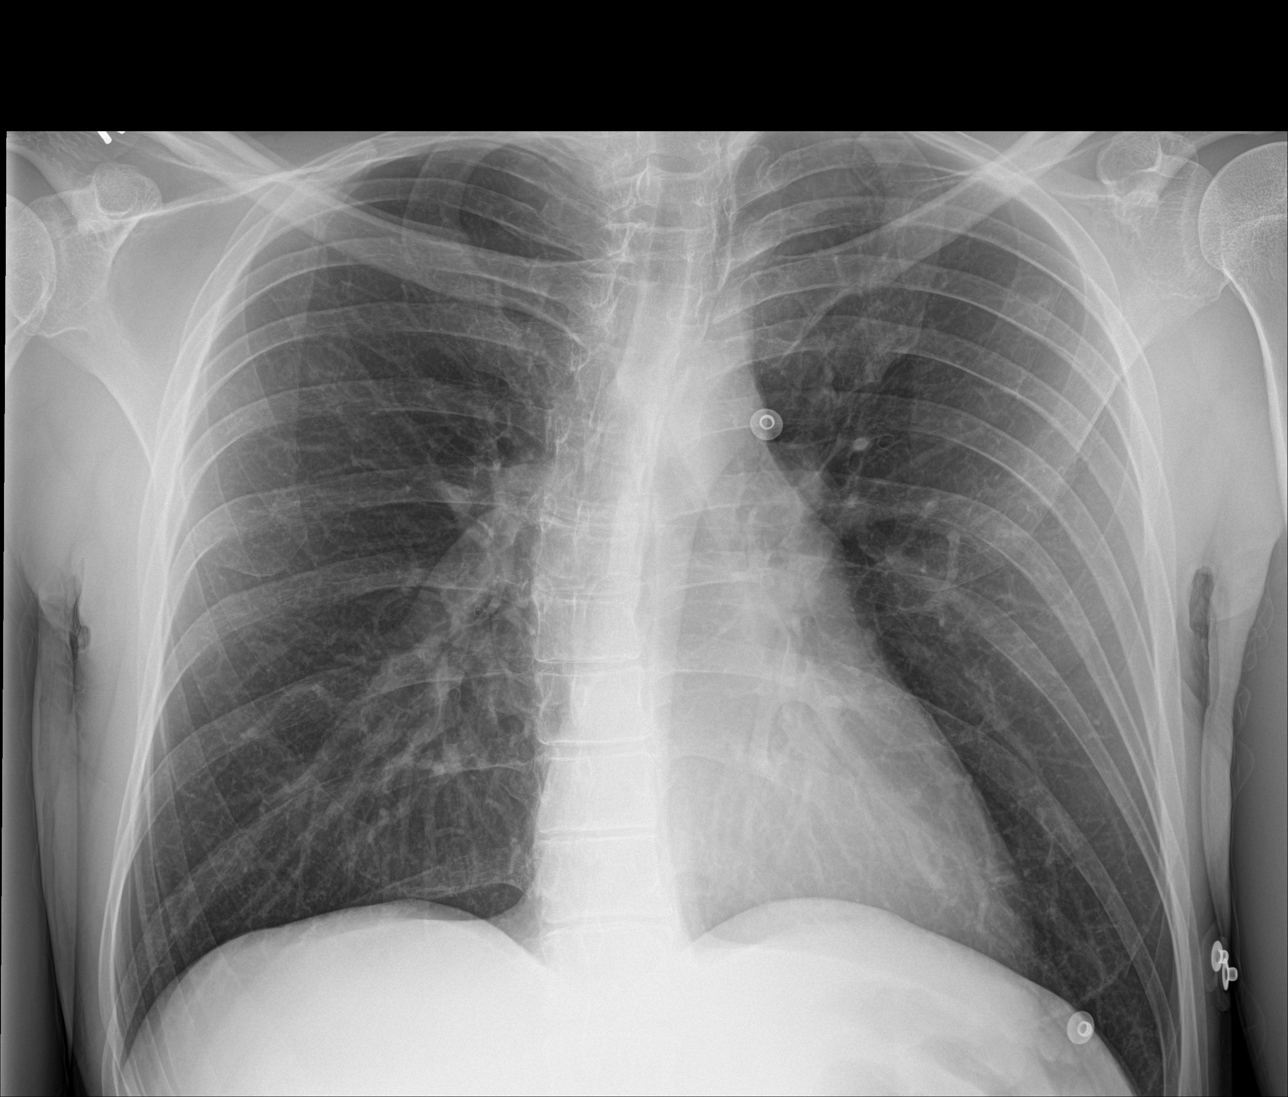

[chest ap (2 of 2)]
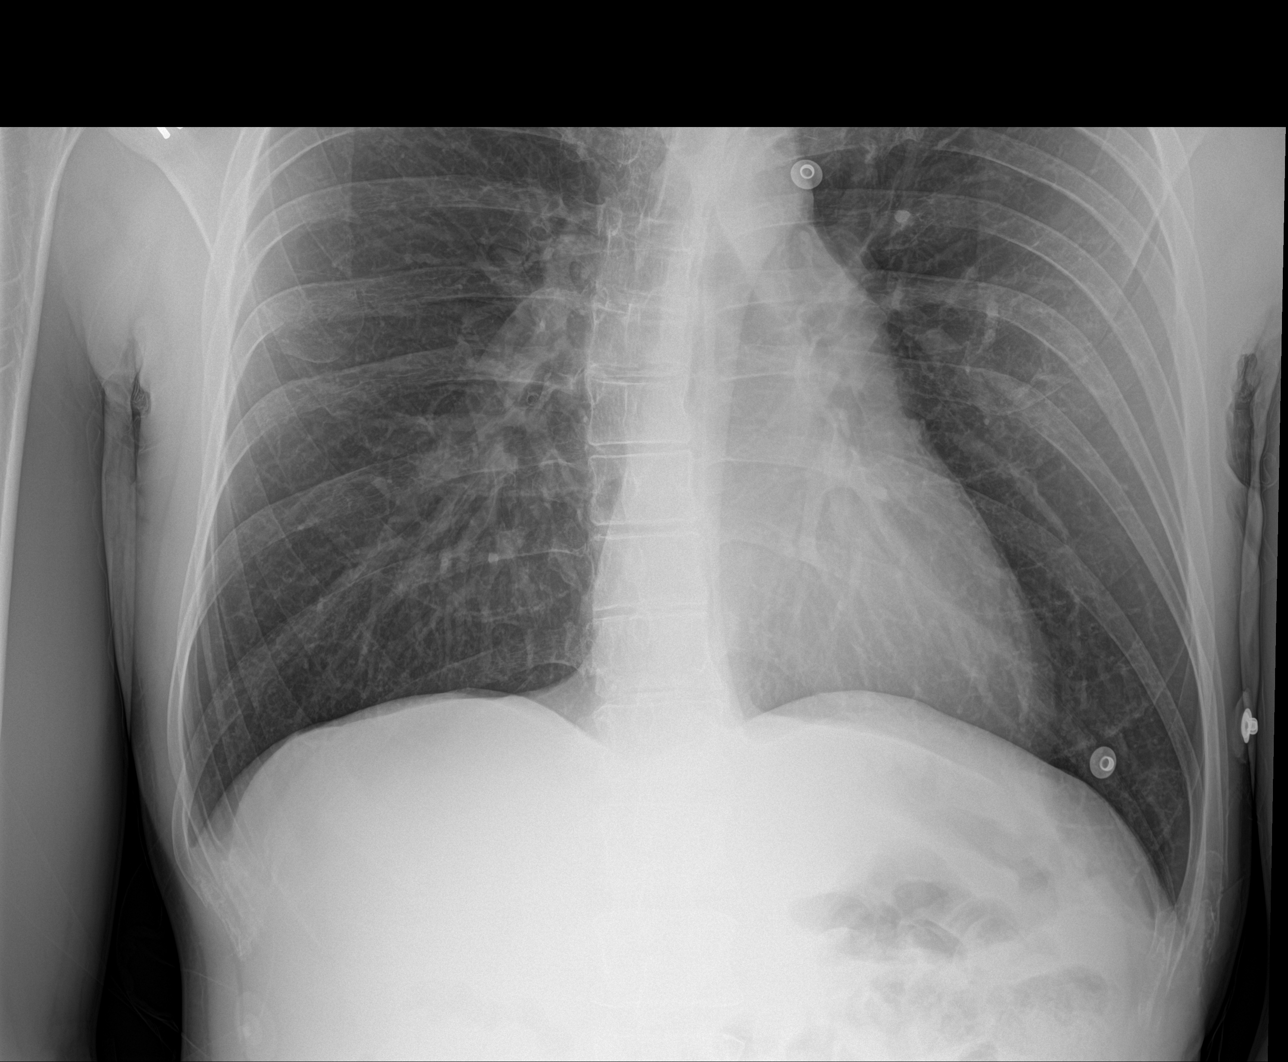

[3 of 3 positions shown; findings below may reference images not displayed]

FINDINGS: The lungs are hyperinflated, new/progressed. The cardiomediastinal
contours are normal. Pulmonary vasculature is normal. No
consolidation, pleural effusion, or pneumothorax. No acute osseous
abnormalities are seen.
IMPRESSION: Hyperinflation, can be seen with acute bronchitis or asthma. No
localizing process.

## 2016-07-26 MED ORDER — LORAZEPAM 2 MG/ML IJ SOLN
2.0000 mg | Freq: Once | INTRAMUSCULAR | Status: AC
  Administered 2016-07-26: 2 mg via INTRAVENOUS
  Filled 2016-07-26: qty 1

## 2016-07-26 MED ORDER — THIAMINE HCL 100 MG/ML IJ SOLN
100.0000 mg | Freq: Every day | INTRAMUSCULAR | Status: DC
  Administered 2016-07-26: 100 mg via INTRAVENOUS
  Filled 2016-07-26: qty 2

## 2016-07-26 MED ORDER — CHLORDIAZEPOXIDE HCL 25 MG PO CAPS: 25.0000 mg | ORAL_CAPSULE | Freq: Three times a day (TID) | ORAL | 0 refills | Status: DC | PRN

## 2016-07-26 MED ORDER — LORAZEPAM 2 MG PO TABS: 0.0000 mg | ORAL_TABLET | Freq: Two times a day (BID) | ORAL | Status: DC

## 2016-07-26 MED ORDER — LORAZEPAM 2 MG/ML IJ SOLN: 0.0000 mg | Freq: Two times a day (BID) | INTRAMUSCULAR | Status: DC

## 2016-07-26 MED ORDER — VITAMIN B-1 100 MG PO TABS
100.0000 mg | ORAL_TABLET | Freq: Every day | ORAL | Status: DC
  Administered 2016-07-26: 100 mg via ORAL
  Filled 2016-07-26: qty 1

## 2016-07-26 MED ORDER — CHLORDIAZEPOXIDE HCL 25 MG PO CAPS
ORAL_CAPSULE | ORAL | Status: AC
  Administered 2016-07-26: 25 mg via ORAL
  Filled 2016-07-26: qty 1

## 2016-07-26 MED ORDER — LORAZEPAM 2 MG/ML IJ SOLN: 0.0000 mg | Freq: Four times a day (QID) | INTRAMUSCULAR | Status: DC

## 2016-07-26 MED ORDER — SODIUM CHLORIDE 0.9 % IV BOLUS (SEPSIS)
1000.0000 mL | Freq: Once | INTRAVENOUS | Status: AC
  Administered 2016-07-26: 1000 mL via INTRAVENOUS

## 2016-07-26 MED ORDER — LORAZEPAM 2 MG PO TABS: 0.0000 mg | ORAL_TABLET | Freq: Four times a day (QID) | ORAL | Status: DC

## 2016-07-26 MED ORDER — CHLORDIAZEPOXIDE HCL 25 MG PO CAPS
25.0000 mg | ORAL_CAPSULE | Freq: Once | ORAL | Status: AC
  Administered 2016-07-26: 25 mg via ORAL

## 2016-07-26 NOTE — ED Notes (Signed)

## 2016-07-26 NOTE — ED Triage Notes (Signed)
Patient to ED via EMS for "heart pain". Patient states the pain is "alcohol induced" and admits to three 40 ounce beers today. Patient with rocking tremor while sitting in the wheelchair. Answers questions appropriately and is respectful and kind to staff. Denies other pain, or injury.

## 2016-07-26 NOTE — ED Provider Notes (Signed)
Patient remains with mild alcohol withdrawal symptoms. Given a dose of oral Librium. Vital signs stable. Just has some tremor. I discussed at length the patient's alcohol dependence with him again and he reaffirms that he is not interested in a detox program or inpatient treatment. I counseled him on the expected course of alcohol withdrawal and likelihood of continued symptoms and worsening which can be dangerous and even life-threatening, but she understands. I'll provide him a prescription for Librium to take as needed, but the patient reports that most likely he will resume drinking although he may try to wean himself off of it in the future. He'll follow up with primary care. He was given outpatient resources.  He is medically and psychiatrically stable without any SI HI or hallucinations. No evidence of DTs or hallucinosis.   Sharman CheekPhillip Stafford, MD 07/26/16 1105

## 2016-07-26 NOTE — BH Assessment (Signed)
Assessment Note  Gary [name] is an 30 y.o. male who presents to the ER due to having concerns about his medical needs. Patient reports of drinking alcohol on a daily basis, for approximately a month. Due to his alcohol use, he is having pains in his chest. He states he is primarily concerned about his medical/physical needs and he do not want help for his alcohol use. "I'm not interested. I just want to feel better." When asked to explain, "feel better" he states, "I need something for the withdrawals."  Patient denies SI/HI and AV/H.   Diagnosis: Alcohol Use Disorder, Severe  Past Medical History:  Past Medical History:  Diagnosis Date  . HIV (human immunodeficiency virus infection) (HCC)     History reviewed. No pertinent surgical history.  Family History: No family history on file.  Social History:  reports that he has been smoking Cigarettes.  He has been smoking about 0.50 packs per day. He has never used smokeless tobacco. He reports that he drinks alcohol. He reports that he uses drugs, including Marijuana.  Additional Social History:  Alcohol / Drug Use Pain Medications: See PTA Prescriptions: See PTA Over the Counter: See PTA History of alcohol / drug use?: Yes Longest period of sobriety (when/how long): "A year" Negative Consequences of Use: Financial, Personal relationships, Work / School Withdrawal Symptoms: Tremors, Nausea / Vomiting, Sweats, Weakness Substance #1 Name of Substance 1: Alcohol 1 - Age of First Use: Teenager 1 - Amount (size/oz): "3, 42oz beers" 1 - Frequency: Daily 1 - Duration: "For the last month" 1 - Last Use / Amount: 07/25/2016  CIWA: CIWA-Ar BP: 107/71 Pulse Rate: 79 Nausea and Vomiting: no nausea and no vomiting Tactile Disturbances: none Tremor: five Auditory Disturbances: not present Paroxysmal Sweats: no sweat visible Visual Disturbances: not present Anxiety: mildly anxious Headache, Fullness in Head: none present Agitation:  normal activity Orientation and Clouding of Sensorium: cannot do serial additions or is uncertain about date CIWA-Ar Total: 7 COWS:    Allergies: No Known Allergies  Home Medications:  (Not in a hospital admission)  OB/GYN Status:  No LMP for male patient.  General Assessment Data Location of Assessment: [facility] ED TTS Assessment: In system Is this a Tele or Face-to-Face Assessment?: Face-to-Face Is this an Initial Assessment or a Re-assessment for this encounter?: Initial Assessment Marital status: Single Maiden name: n/a Is patient pregnant?: No Pregnancy Status: No Living Arrangements: Non-relatives/Friends Can pt return to current living arrangement?: Yes Admission Status: Voluntary Is patient capable of signing voluntary admission?: Yes Referral Source: Self/Family/Friend Insurance type: None  Medical Screening Exam [facility] Walk-in ONLY) Medical Exam completed: Yes  Crisis Care Plan Living Arrangements: Non-relatives/Friends Legal Guardian: Other: (None) Name of Psychiatrist: Reports of none Name of Therapist: Reports of none  Education Status Is patient currently in school?: No Current Grade: n/a Highest grade of school patient has completed: 12th Grade Name of school: n/a Contact person: n/a  Risk to self with the past 6 months Suicidal Ideation: No Has patient been a risk to self within the past 6 months prior to admission? : No Suicidal Intent: No Has patient had any suicidal intent within the past 6 months prior to admission? : No Is patient at risk for suicide?: No Suicidal Plan?: No Has patient had any suicidal plan within the past 6 months prior to admission? : No Access to Means: No What has been your use of drugs/alcohol within the last 12 months?: Alcohol Previous Attempts/Gestures: No How many times?:  0 Other Self Harm Risks: n/a Triggers for Past Attempts: None known Intentional Self Injurious Behavior: None Family Suicide History: No Recent  stressful life event(s): Other (Comment) (Active Addiction ) Persecutory voices/beliefs?: No Depression: Yes Depression Symptoms: Feeling angry/irritable, Guilt, Isolating Substance abuse history and/or treatment for substance abuse?: Yes Suicide prevention information given to non-admitted patients: Not applicable  Risk to Others within the past 6 months Homicidal Ideation: No Does patient have any lifetime risk of violence toward others beyond the six months prior to admission? : No Thoughts of Harm to Others: No Current Homicidal Intent: No Current Homicidal Plan: No Access to Homicidal Means: No Identified Victim: Reports of none History of harm to others?: No Assessment of Violence: None Noted Violent Behavior Description: Reports of none Does patient have access to weapons?: No Criminal Charges Pending?: No Does patient have a court date: No Is patient on probation?: No  Psychosis Hallucinations: None noted Delusions: None noted  Mental Status Report Appearance/Hygiene: In scrubs, Unremarkable Eye Contact: Poor Motor Activity: Unsteady Speech: Logical/coherent, Soft, Slurred Level of Consciousness: Alert Mood: Anxious, Pleasant Affect: Appropriate to circumstance, Depressed Anxiety Level: Minimal Thought Processes: Coherent, Relevant Judgement: Partial Orientation: Person, Place, Time, Situation, Appropriate for developmental age Obsessive Compulsive Thoughts/Behaviors: Minimal  Cognitive Functioning Concentration: Decreased (Patient is intoxicated) Memory: Remote Intact, Recent Impaired (Patient is intoxicated) IQ: Average Insight: Fair Impulse Control: Poor Appetite: Fair Weight Loss: 0 Weight Gain: 0 Sleep: No Change Total Hours of Sleep: 6 Vegetative Symptoms: None  ADLScreening [facility] Assessment Services) Patient's cognitive ability adequate to safely complete daily activities?: Yes Patient able to express need for assistance with ADLs?:  Yes Independently performs ADLs?: Yes (appropriate for developmental age)  Prior Inpatient Therapy Prior Inpatient Therapy: No Prior Therapy Dates: Reports of none Prior Therapy Facilty/Provider(s): Reports of none  Reason for Treatment: Reports of none   Prior Outpatient Therapy Prior Outpatient Therapy: No Prior Therapy Dates: Reports of none  Prior Therapy Facilty/Provider(s): Reports of none  Reason for Treatment: Reports of none  Does patient have an ACCT team?: No Does patient have Intensive In-House Services?  : No Does patient have [facility] services? : No Does patient have P4CC services?: No  ADL Screening (condition at time of admission) Patient's cognitive ability adequate to safely complete daily activities?: Yes Is the patient deaf or have difficulty hearing?: No Does the patient have difficulty seeing, even when wearing glasses/contacts?: No Does the patient have difficulty concentrating, remembering, or making decisions?: No Patient able to express need for assistance with ADLs?: Yes Does the patient have difficulty dressing or bathing?: No Independently performs ADLs?: Yes (appropriate for developmental age) Does the patient have difficulty walking or climbing stairs?: No Weakness of Legs: None Weakness of Arms/Hands: None  Home Assistive Devices/Equipment Home Assistive Devices/Equipment: None  Therapy Consults (therapy consults require a physician order) PT Evaluation Needed: No OT Evalulation Needed: No SLP Evaluation Needed: No Abuse/Neglect Assessment (Assessment to be complete while patient is alone) Physical Abuse: Denies Verbal Abuse: Denies Sexual Abuse: Denies Exploitation of patient/patient's resources: Denies Self-Neglect: Denies Values / Beliefs Cultural Requests During Hospitalization: None Spiritual Requests During Hospitalization: None Consults Spiritual Care Consult Needed: No Social Work Consult Needed: No      Additional  Information 1:1 In Past 12 Months?: No CIRT Risk: No Elopement Risk: No Does patient have medical clearance?: Yes  Child/Adolescent Assessment Running Away Risk: Denies (Patient is an adult)  Disposition:  Disposition Initial Assessment Completed for this Encounter: Yes Disposition of  Patient: Other dispositions  On Site Evaluation by:   Reviewed with Physician:    Lilyan Gilford MS, LCAS, LPC, NCC, CCSI Therapeutic Triage Specialist 07/26/2016 9:42 AM

## 2016-07-26 NOTE — ED Notes (Addendum)
EMS pt to stat registration c/o chest pain and "a lot of anxiety"; pt says it feels like his heart is beating too fast; EMS reports pt admits to 3-40oz beers tonight and says this happens when he drinks too much; daily drinker; pt denies drug use

## 2016-07-26 NOTE — ED Notes (Signed)
Patient states he is here because he feels like his heart is beating too hard.  Patient with noted tremor.  Patient states that he drinks alcohol daily and that sometimes when he drinks too much that he feels this way.  Patient states that he only wants help feeling better with his heart but that he doesn't want to stop drinking alcohol.

## 2016-07-26 NOTE — ED Provider Notes (Signed)
Time Seen: Approximately *0525  I have reviewed the triage notes  Chief Complaint: Chest Pain   History of Present Illness: Gary [name] is a 30 y.o. male *who presents via EMS for "" heart pain "". Patient states he feels very anxious and felt that his heart was racing. He states he's noticed a resting tremor. Patient has a significant history of severe alcohol abuse which he states he normally drinks 340 ounce beers per day. He states the last time he had something drink was several hours ago. He denies any vomiting though had some nausea. He denies any shortness of breath. Patient has a significant history of being HIV positive and states he has been taking his medication as prescribed. Of his last CD4 count denies any fever or productive cough. Patient denies any illicit drugs outside of marijuana. Patient denies any hallucinations and denies any suicidal or homicidal thoughts  Past Medical History:  Diagnosis Date  . HIV (human immunodeficiency virus infection) (HCC)     There are no active problems to display for this patient.   History reviewed. No pertinent surgical history.  History reviewed. No pertinent surgical history.  Current Outpatient Rx  . Order #: [id number] Class: Historical Med    Allergies:  Review of patient's allergies indicates no known allergies.  Family History: No family history on file. No early cardiovascular disease Social History: Social History  Substance Use Topics  . Smoking status: Current Every Day Smoker    Packs/day: 0.50    Types: Cigarettes  . Smokeless tobacco: Never Used  . Alcohol use Yes     Comment: socially     Review of Systems:   10 point review of systems was performed and was otherwise negative:  Constitutional: No fever Eyes: No visual disturbances ENT: No sore throat, ear pain Cardiac: Mild substernal chest discomfort. No pleuritic or positional component Respiratory: No shortness of breath, wheezing, or  stridor Abdomen: No abdominal pain, no vomiting, No diarrhea Endocrine: No weight loss, No night sweats Extremities: No peripheral edema, cyanosis Skin: No rashes, easy bruising Neurologic: No focal weakness, trouble with speech or swollowing Urologic: No dysuria, Hematuria, or urinary frequency   Physical Exam:  ED Triage Vitals  Enc Vitals Group     BP 07/26/16 0458 112/71     Pulse Rate 07/26/16 0458 92     Resp 07/26/16 0458 20     Temp 07/26/16 0458 98.3 F (36.8 C)     Temp Source 07/26/16 0458 Oral     SpO2 07/26/16 0458 97 %     Weight 07/26/16 0459 148 lb (67.1 kg)     Height 07/26/16 0459 6\' 1"  (1.854 m)     Head Circumference --      Peak Flow --      Pain Score 07/26/16 0459 8     Pain Loc --      Pain Edu? --      Excl. in GC? --     General: Awake , Alert , and Oriented times 3; GCS 15 Head: Normal cephalic , atraumatic Eyes: Pupils equal , round, reactive to light Nose/Throat: No nasal drainage, patent upper airway without erythema or exudate.  Neck: Supple, Full range of motion, No anterior adenopathy or palpable thyroid masses Lungs: Clear to ascultation without wheezes , rhonchi, or rales Heart: Regular rate, regular rhythm without murmurs , gallops , or rubs Abdomen: Soft, non tender without rebound, guarding , or rigidity; bowel sounds positive and symmetric  in all 4 quadrants. No organomegaly .        Extremities: 2 plus symmetric pulses. No edema, clubbing or cyanosis Neurologic: Resting tremor normal ambulation, Motor symmetric without deficits, sensory intact Skin: warm, dry, no rashes   Labs:   All laboratory work was reviewed including any pertinent negatives or positives listed below:  Labs Reviewed  CBC - Abnormal; Notable for the following:       Result Value   MCH 34.1 (*)    All other components within normal limits  ETHANOL - Abnormal; Notable for the following:    Alcohol, Ethyl (B) 174 (*)    All other components within normal  limits  BASIC METABOLIC PANEL  TROPONIN I  Alcohols level is elevated otherwise no significant abnormalities  EKG:  ED ECG REPORT I, Jennye Moccasin, the attending physician, personally viewed and interpreted this ECG.  Date: 07/26/2016 EKG Time: 0455 Rate: 81 Rhythm: normal sinus rhythm QRS Axis: normal Intervals: normal ST/T Wave abnormalities: normal Conduction Disturbances: none Narrative Interpretation: unremarkable No acute ischemic changes    ED Course:  Patient is unlikely to have ischemic chest pain or other life-threatening causes at this time. His presentation appears to be of alcohol withdrawal and anxiety. The patient was started on benzodiazepine therapy per CIWA protocol. He is otherwise hemodynamically stable and was also given some IV fluids. The patient on his last assessment still wishes not to have any inpatient alcohol treatment established. Clinical Course     Assessment:  Alcohol withdrawal      Plan: * Patient was started on Treatment program and IV fluid replacement.Jennye Moccasin, MD 07/26/16 [id number]

## 2016-07-26 NOTE — ED Notes (Signed)
Patient states he drinks everyday. It has been since noon yesterday since his last drink.

## 2016-08-11 ENCOUNTER — Emergency Department
Admission: EM | Admit: 2016-08-11 | Discharge: 2016-08-11 | Disposition: A | Discharge status: Home / Self Care | Payer: Self-pay | Attending: Emergency Medicine | Admitting: Emergency Medicine

## 2016-08-11 DIAGNOSIS — Z21 Asymptomatic human immunodeficiency virus [HIV] infection status: Secondary | ICD-10-CM | POA: Insufficient documentation

## 2016-08-11 DIAGNOSIS — F17210 Nicotine dependence, cigarettes, uncomplicated: Secondary | ICD-10-CM | POA: Insufficient documentation

## 2016-08-11 DIAGNOSIS — F1290 Cannabis use, unspecified, uncomplicated: Secondary | ICD-10-CM | POA: Insufficient documentation

## 2016-08-11 DIAGNOSIS — F1010 Alcohol abuse, uncomplicated: Secondary | ICD-10-CM | POA: Insufficient documentation

## 2016-08-11 LAB — COMPREHENSIVE METABOLIC PANEL
ALT: 52 U/L (ref 17–63)
AST: 40 U/L (ref 15–41)
Albumin: 5 g/dL (ref 3.5–5.0)
Alkaline Phosphatase: 47 U/L (ref 38–126)
Anion gap: 12 (ref 5–15)
BUN: 15 mg/dL (ref 6–20)
CO2: 26 mmol/L (ref 22–32)
Calcium: 9.9 mg/dL (ref 8.9–10.3)
Chloride: 102 mmol/L (ref 101–111)
Creatinine, Ser: 0.84 mg/dL (ref 0.61–1.24)
GFR calc Af Amer: >60 mL/min (ref >60)
GFR calc non Af Amer: >60 mL/min (ref >60)
Glucose, Bld: 82 mg/dL (ref 65–99)
Potassium: 4.3 mmol/L (ref 3.5–5.1)
Sodium: 140 mmol/L (ref 135–145)
Total Bilirubin: 1.1 mg/dL (ref 0.3–1.2)
Total Protein: 8.6 g/dL — ABNORMAL HIGH (ref 6.5–8.1)

## 2016-08-11 LAB — CBC
HCT: 45.2 % (ref 40.0–52.0)
Hemoglobin: 16.2 g/dL (ref 13.0–18.0)
MCH: 34.5 pg — ABNORMAL HIGH (ref 26.0–34.0)
MCHC: 35.8 g/dL (ref 32.0–36.0)
MCV: 96.3 fL (ref 80.0–100.0)
Platelets: 277 10*3/uL (ref 150–440)
RBC: 4.69 MIL/uL (ref 4.40–5.90)
RDW: 14.6 % — ABNORMAL HIGH (ref 11.5–14.5)
WBC: 10.2 10*3/uL (ref 3.8–10.6)

## 2016-08-11 LAB — URINE DRUG SCREEN, QUALITATIVE (ARMC ONLY)
Amphetamines, Ur Screen: NOT DETECTED
Barbiturates, Ur Screen: NOT DETECTED
Benzodiazepine, Ur Scrn: POSITIVE — AB
Cannabinoid 50 Ng, Ur ~~LOC~~: NOT DETECTED
Cocaine Metabolite,Ur ~~LOC~~: NOT DETECTED
MDMA (Ecstasy)Ur Screen: NOT DETECTED
Methadone Scn, Ur: NOT DETECTED
Opiate, Ur Screen: NOT DETECTED
Phencyclidine (PCP) Ur S: NOT DETECTED
Tricyclic, Ur Screen: NOT DETECTED

## 2016-08-11 LAB — ETHANOL: Alcohol, Ethyl (B): 21 mg/dL — ABNORMAL HIGH (ref <5)

## 2016-08-11 MED ORDER — SODIUM CHLORIDE 0.9 % IV BOLUS (SEPSIS)
1000.0000 mL | Freq: Once | INTRAVENOUS | Status: AC
  Administered 2016-08-11: 1000 mL via INTRAVENOUS

## 2016-08-11 MED ORDER — CHLORDIAZEPOXIDE HCL 25 MG PO CAPS
25.0000 mg | ORAL_CAPSULE | Freq: Once | ORAL | Status: AC
  Administered 2016-08-11: 25 mg via ORAL
  Filled 2016-08-11: qty 1

## 2016-08-11 NOTE — ED Notes (Addendum)
Pt here for trembling. Last use of alcohol was yesterday AM. Pt drinks daily per his report. Pt alert and oriented X4, active, cooperative, pt in NAD. RR even and unlabored, color WNL.  Denies SI or HI.

## 2016-08-11 NOTE — ED Provider Notes (Signed)
[facility] Emergency Department Provider Note  ____________________________________________  Time seen: Approximately 10:26 AM  I have reviewed the triage vital signs and the nursing notes.   HISTORY  Chief Complaint Delirium Tremens (DTS)    HPI KAINEN STRUCKMAN is a 30 y.o. male who complains of alcohol withdrawal and shaking. He reports that he normally drinks about 18 beers a day and his last drink was yesterday. He started feeling bad yesterday evening and also complains of diffuse myalgias. Denies seizures or visual or tactile hallucinations.  Patient was seen in this ED about 2 weeks ago for similar symptoms. At that time he was not interested in detox. I provided him a prescription of Librium at that time at his request an outpatient resources. However, patient has continue drinking     Past Medical History:  Diagnosis Date  . HIV (human immunodeficiency virus infection) (HCC)      There are no active problems to display for this patient.    History reviewed. No pertinent surgical history.   Prior to Admission medications   Medication Sig Start Date End Date Taking? Authorizing Provider  GENVOYA 150-150-200-10 MG TABS tablet Take 1 tablet by mouth daily. 06/30/16   Historical Provider, MD     Allergies Review of patient's allergies indicates no known allergies.   No family history on file.  Social History Social History  Substance Use Topics  . Smoking status: Current Every Day Smoker    Packs/day: 0.50    Types: Cigarettes  . Smokeless tobacco: Never Used  . Alcohol use Yes     Comment: 18pk/day    Review of Systems  Constitutional:   No fever Positive chills.  ENT:   No sore throat. No rhinorrhea. Cardiovascular:   No chest pain. Respiratory:   No dyspnea or cough. Gastrointestinal:   Negative for abdominal pain, vomiting and diarrhea.  Musculoskeletal:   Negative for focal pain or swelling positive diffuse  myalgias Neurological:   Negative for headaches 10-point ROS otherwise negative.  ____________________________________________   PHYSICAL EXAM:  VITAL SIGNS: ED Triage Vitals  Enc Vitals Group     BP 08/11/16 0955 119/68     Pulse Rate 08/11/16 0955 95     Resp 08/11/16 0955 18     Temp 08/11/16 0955 98.7 F (37.1 C)     Temp Source 08/11/16 0955 Oral     SpO2 08/11/16 0955 98 %     Weight 08/11/16 0956 148 lb (67.1 kg)     Height 08/11/16 0956 6\' 1"  (1.854 m)     Head Circumference --      Peak Flow --      Pain Score 08/11/16 0956 9     Pain Loc --      Pain Edu? --      Excl. in GC? --     Vital signs reviewed, nursing assessments reviewed.   Constitutional:   Alert and oriented. Well appearing and in no distress. Eyes:   No scleral icterus. No conjunctival pallor. PERRL. EOMI.  No nystagmus. ENT   Head:   Normocephalic and atraumatic.   Nose:   No congestion/rhinnorhea. No septal hematoma   Mouth/Throat:   MMM, no pharyngeal erythema. No peritonsillar mass.    Neck:   No stridor. No SubQ emphysema. No meningismus. Hematological/Lymphatic/Immunilogical:   No cervical lymphadenopathy. Cardiovascular:   RRR. Symmetric bilateral radial and DP pulses.  No murmurs.  Respiratory:   Normal respiratory effort without tachypnea nor retractions.  Breath sounds are clear and equal bilaterally. No wheezes/rales/rhonchi. Gastrointestinal:   Soft and nontender. Non distended. There is no CVA tenderness.  No rebound, rigidity, or guarding. Genitourinary:   deferred Musculoskeletal:   Nontender with normal range of motion in all extremities. No joint effusions.  No lower extremity tenderness.  No edema. Neurologic:   Normal speech and language.  CN 2-10 normal. Motor grossly intact. On initial introduction on walking in the room, the patient is looking through his backpack, preparing and applying a deodorant stick and performing fine motor tasks without difficulty. He  ambulates with steady gait.  During my examination and while describing his shaking, the patient's shaking symptoms become more and more pronounced. No tongue fasciculations. No fine motor tremor, but more of a shaking of both arms in what appears to be an intentional fashion. No gross focal neurologic deficits are appreciated.  Skin:    Skin is warm, dry and intact. No rash noted.  No petechiae, purpura, or bullae.  ____________________________________________    LABS (pertinent positives/negatives) (all labs ordered are listed, but only abnormal results are displayed) Labs Reviewed  COMPREHENSIVE METABOLIC PANEL - Abnormal; Notable for the following:       Result Value   Total Protein 8.6 (*)    All other components within normal limits  ETHANOL - Abnormal; Notable for the following:    Alcohol, Ethyl (B) 21 (*)    All other components within normal limits  CBC - Abnormal; Notable for the following:    MCH 34.5 (*)    RDW 14.6 (*)    All other components within normal limits  URINE DRUG SCREEN, QUALITATIVE ([facility] ONLY) - Abnormal; Notable for the following:    Benzodiazepine, Ur Scrn POSITIVE (*)    All other components within normal limits   ____________________________________________   EKG    ____________________________________________    RADIOLOGY    ____________________________________________   PROCEDURES Procedures  ____________________________________________   INITIAL IMPRESSION / ASSESSMENT AND PLAN / ED COURSE  Pertinent labs & imaging results that were available during my care of the patient were reviewed by me and considered in my medical decision making (see chart for details).  Patient complains of myalgias and shaking and concern for alcohol withdrawal. Ethanol level is 21 and he's been drinking as recently as yesterday. On exam, the shaking appears to be factitious, vital signs are normal, and he does not appear to have any significant  withdrawal symptoms. Patient given outpatient resources for follow-up with substance abuse treatment centers in the area. Controlled substance reporting system reviewed, patient did fill the Librium prescription that I gave him 2 weeks ago, and urine drug screen is positive for benzodiazepines. Out of concern for further substance abuse related to benzodiazepines that might be used for home treatment of withdrawal, we'll have to withhold any further prescriptions at this time. Patient advised to follow-up if he is serious about detox. Does not require inpatient management at this time. Labs unremarkable, no evidence of severe acidosis or dehydration. Patient given a single dose of Librium here in the ED.     Clinical Course   ____________________________________________   FINAL CLINICAL IMPRESSION(S) / ED DIAGNOSES  Final diagnoses:  Alcohol abuse       Portions of this note were generated with dragon dictation software. Dictation errors may occur despite best attempts at proofreading.    Sharman CheekPhillip Stafford, MD 08/11/16 1256

## 2016-08-11 NOTE — ED Triage Notes (Signed)
Says he is having withdrawal from etoh.  Last drink was 24 hr ago.  Pt appears shkey.

## 2016-08-11 NOTE — ED Notes (Signed)
Pt given sandwich tray and drink. 

## 2016-08-13 ENCOUNTER — Emergency Department
Admission: EM | Admit: 2016-08-13 | Discharge: 2016-08-13 | Disposition: A | Discharge status: Home / Self Care | Payer: Self-pay | Attending: Emergency Medicine | Admitting: Emergency Medicine

## 2016-08-13 ENCOUNTER — Emergency Department: Payer: Self-pay

## 2016-08-13 ENCOUNTER — Encounter: Payer: Self-pay | Admitting: Emergency Medicine

## 2016-08-13 DIAGNOSIS — R0789 Other chest pain: Secondary | ICD-10-CM | POA: Insufficient documentation

## 2016-08-13 DIAGNOSIS — Z79899 Other long term (current) drug therapy: Secondary | ICD-10-CM | POA: Insufficient documentation

## 2016-08-13 DIAGNOSIS — F10230 Alcohol dependence with withdrawal, uncomplicated: Secondary | ICD-10-CM

## 2016-08-13 DIAGNOSIS — F10239 Alcohol dependence with withdrawal, unspecified: Secondary | ICD-10-CM | POA: Insufficient documentation

## 2016-08-13 DIAGNOSIS — F17210 Nicotine dependence, cigarettes, uncomplicated: Secondary | ICD-10-CM | POA: Insufficient documentation

## 2016-08-13 DIAGNOSIS — F10930 Alcohol use, unspecified with withdrawal, uncomplicated: Secondary | ICD-10-CM

## 2016-08-13 LAB — CBC
HCT: 38.3 % — ABNORMAL LOW (ref 40.0–52.0)
Hemoglobin: 13.3 g/dL (ref 13.0–18.0)
MCH: 33.8 pg (ref 26.0–34.0)
MCHC: 34.8 g/dL (ref 32.0–36.0)
MCV: 97.2 fL (ref 80.0–100.0)
Platelets: 201 10*3/uL (ref 150–440)
RBC: 3.93 MIL/uL — ABNORMAL LOW (ref 4.40–5.90)
RDW: 14.1 % (ref 11.5–14.5)
WBC: 3.9 10*3/uL (ref 3.8–10.6)

## 2016-08-13 LAB — HEPATIC FUNCTION PANEL
ALT: 36 U/L (ref 17–63)
AST: 30 U/L (ref 15–41)
Albumin: 3.8 g/dL (ref 3.5–5.0)
Alkaline Phosphatase: 36 U/L — ABNORMAL LOW (ref 38–126)
Bilirubin, Direct: 0.1 mg/dL (ref 0.1–0.5)
Indirect Bilirubin: 0.9 mg/dL (ref 0.3–0.9)
Total Bilirubin: 1 mg/dL (ref 0.3–1.2)
Total Protein: 6.4 g/dL — ABNORMAL LOW (ref 6.5–8.1)

## 2016-08-13 LAB — BASIC METABOLIC PANEL
Anion gap: 4 — ABNORMAL LOW (ref 5–15)
BUN: 8 mg/dL (ref 6–20)
CO2: 29 mmol/L (ref 22–32)
Calcium: 8.5 mg/dL — ABNORMAL LOW (ref 8.9–10.3)
Chloride: 104 mmol/L (ref 101–111)
Creatinine, Ser: 0.68 mg/dL (ref 0.61–1.24)
GFR calc Af Amer: >60 mL/min (ref >60)
GFR calc non Af Amer: >60 mL/min (ref >60)
Glucose, Bld: 89 mg/dL (ref 65–99)
Potassium: 3.7 mmol/L (ref 3.5–5.1)
Sodium: 137 mmol/L (ref 135–145)

## 2016-08-13 LAB — CK: Total CK: 110 U/L (ref 49–397)

## 2016-08-13 LAB — TROPONIN I
Troponin I: <0.03 ng/mL (ref <0.03)
Troponin I: <0.03 ng/mL (ref <0.03)

## 2016-08-13 LAB — ETHANOL: Alcohol, Ethyl (B): <5 mg/dL (ref <5)

## 2016-08-13 IMAGING — CR DG CHEST 2V
2 series · 2 of 2 positions shown · non-contrast
Comparison: Chest radiograph [DATE]

CLINICAL DATA: Pounding chest pain. Unable to sleep. History of
HIV.

EXAM:
CHEST  2 VIEW

[chest pa]
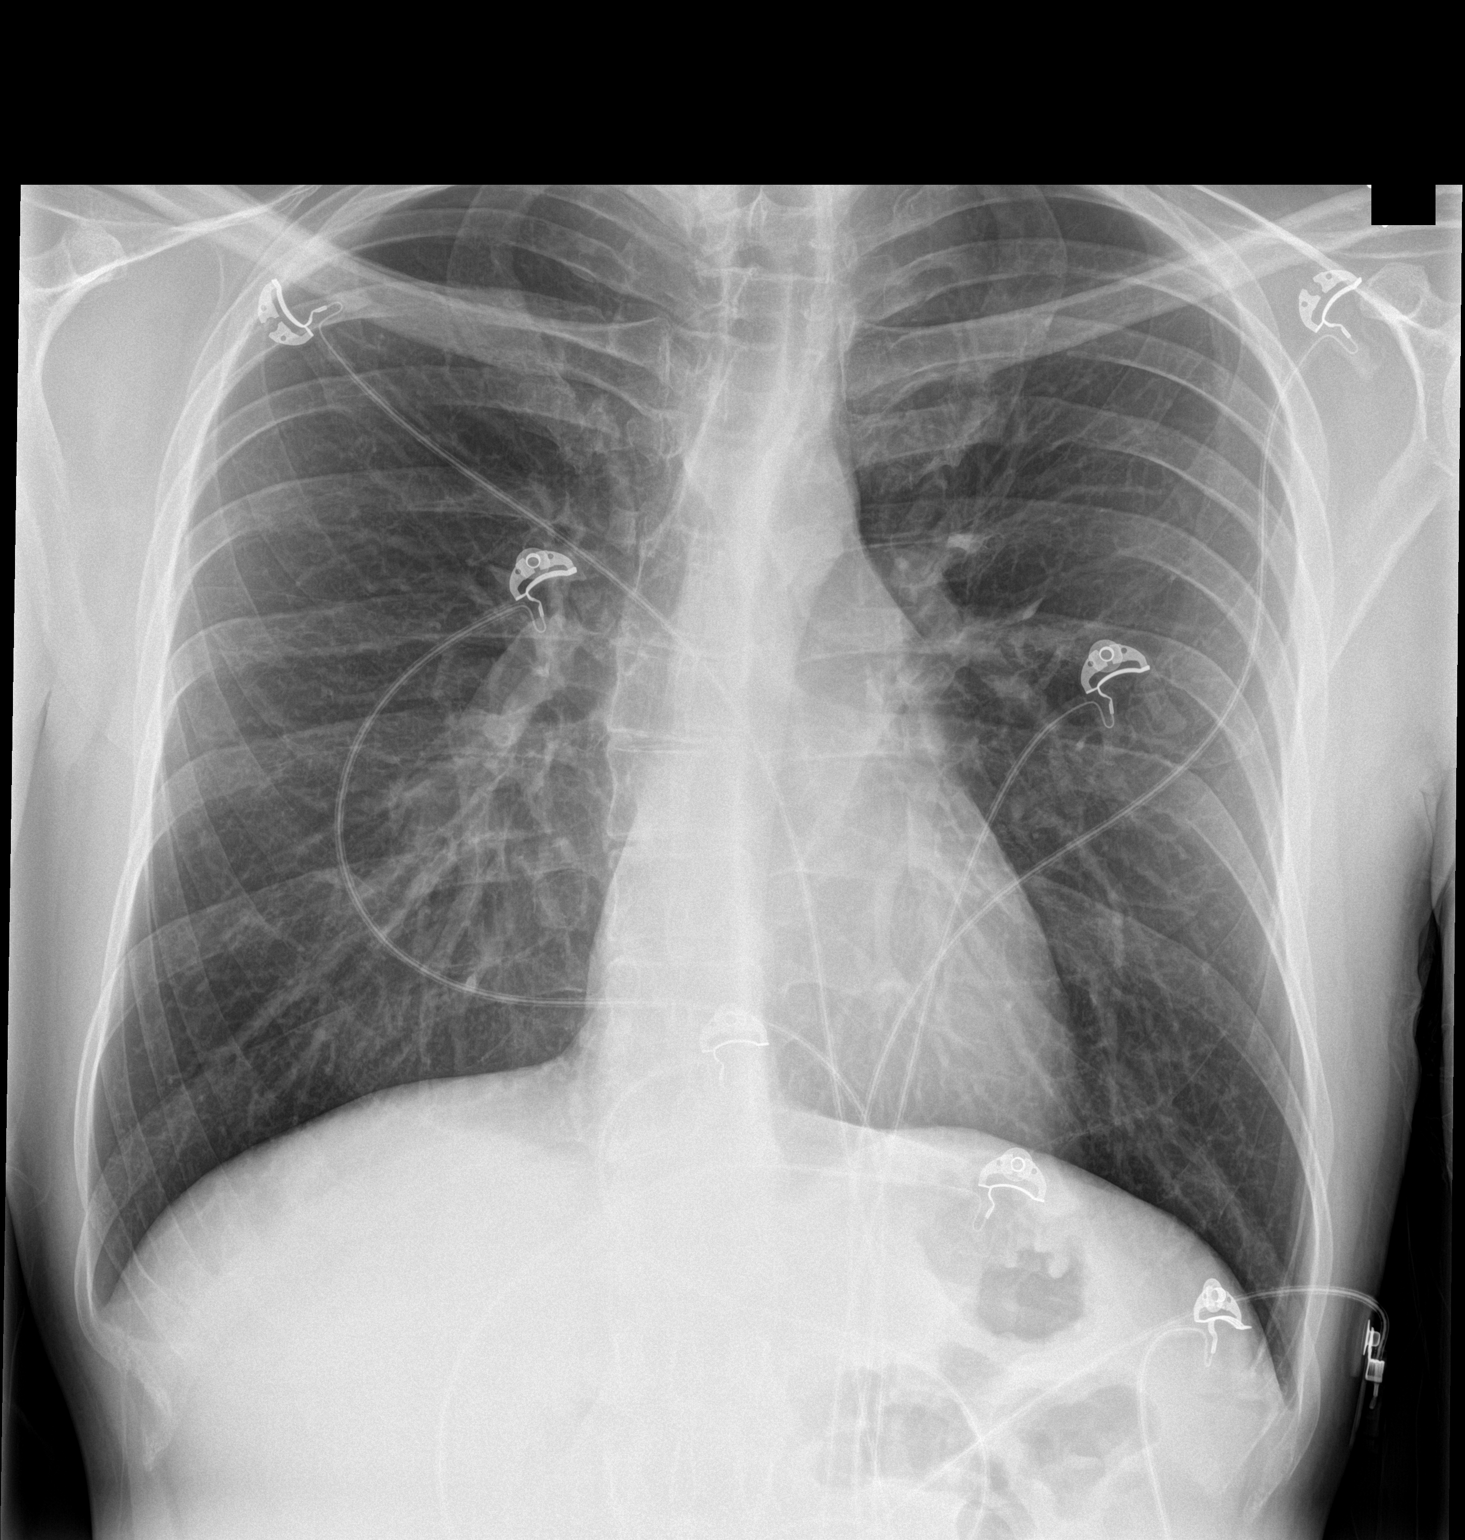

[chest lat]
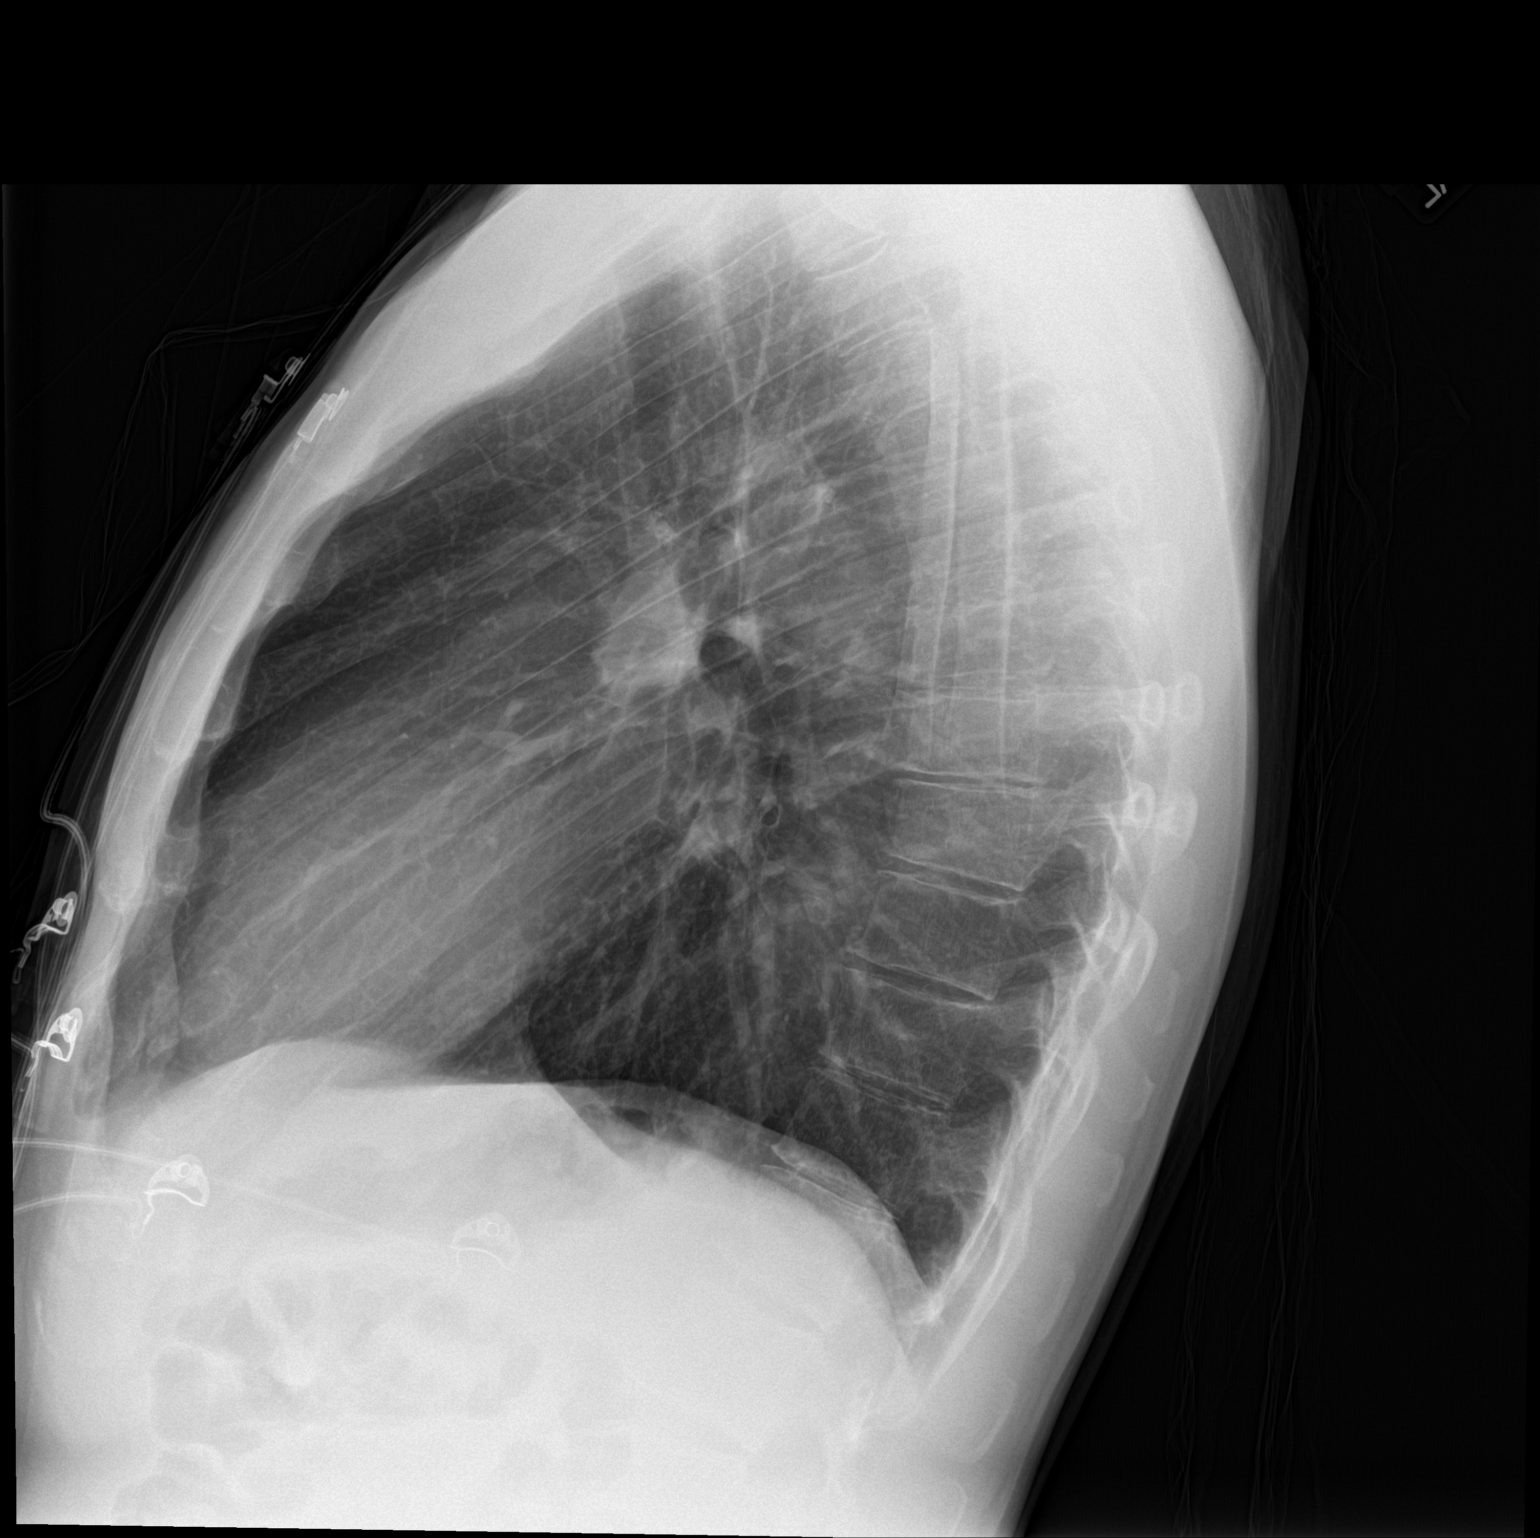

[2 of 2 positions shown; findings below may reference images not displayed]

FINDINGS: Cardiomediastinal silhouette is normal. No pleural effusions or
focal consolidations. Similar hyper inflation. Trachea projects
midline and there is no pneumothorax. Soft tissue planes and
included osseous structures are non-suspicious. Broad thoracic
dextroscoliosis.
IMPRESSION: No acute cardiopulmonary process.

## 2016-08-13 MED ORDER — FOLIC ACID 1 MG PO TABS
1.0000 mg | ORAL_TABLET | Freq: Once | ORAL | Status: AC
  Administered 2016-08-13: 1 mg via ORAL
  Filled 2016-08-13: qty 1

## 2016-08-13 MED ORDER — ACETAMINOPHEN 500 MG PO TABS
1000.0000 mg | ORAL_TABLET | Freq: Once | ORAL | Status: AC
  Administered 2016-08-13: 1000 mg via ORAL
  Filled 2016-08-13: qty 2

## 2016-08-13 MED ORDER — SODIUM CHLORIDE 0.9 % IV BOLUS (SEPSIS)
1000.0000 mL | INTRAVENOUS | Status: AC
  Administered 2016-08-13: 1000 mL via INTRAVENOUS

## 2016-08-13 MED ORDER — VITAMIN B-1 100 MG PO TABS
100.0000 mg | ORAL_TABLET | Freq: Once | ORAL | Status: AC
  Administered 2016-08-13: 100 mg via ORAL
  Filled 2016-08-13: qty 1

## 2016-08-13 NOTE — ED Notes (Signed)
Patient transported to X-ray 

## 2016-08-13 NOTE — ED Provider Notes (Signed)
[facility] Emergency Department Provider Note  ____________________________________________   First MD Initiated Contact with Patient 08/13/16 0139     (approximate)  I have reviewed the triage vital signs and the nursing notes.   HISTORY  Chief Complaint Chest Pain    HPI Gary [name] is a 30 y.o. male with a history of chronic alcohol abuse and HIV who presents for evaluation of a pounding sensation in his chest that started hours ago.  He reports that he has had about the same as usual to drink today (he has had about 8 beers so far today and he usually has up to 12 daily).  He did, however, walk about 5 miles to get from our review was to his brother's house and he reports not having much to drink in terms of water today.  He denies fever/chills, redness of breath, abdominal pain, nausea, vomiting, dysuria.  He describes a sensation in his chest as pounding, not exactly pain, and that it is severe and keeping him from being able to go to sleep even though he feels very tired.  Nothing makes it Better and nothing makes it worse.  Says this has happened before  Echo record show that he has presented twice recently to the emergency department for alcohol withdrawal symptoms.  In each instance he has refused inpatient and outpatient treatment and does not seem to be a candidate for inpatient treatment given no complicated withdrawal.  He reports that he is going to continue drinking but he denies suicidal and homicidal ideation.  Past Medical History:  Diagnosis Date  . HIV (human immunodeficiency virus infection) (HCC)     There are no active problems to display for this patient.   History reviewed. No pertinent surgical history.  Prior to Admission medications   Medication Sig Start Date End Date Taking? Authorizing Provider  GENVOYA 150-150-200-10 MG TABS tablet Take 1 tablet by mouth daily. 06/30/16  Yes Historical Provider, MD     Allergies Review of patient's allergies indicates no known allergies.  History reviewed. No pertinent family history.  Social History Social History  Substance Use Topics  . Smoking status: Current Every Day Smoker    Packs/day: 0.50    Types: Cigarettes  . Smokeless tobacco: Never Used  . Alcohol use Yes     Comment: 18pk/day    Review of Systems Constitutional: No fever/chills Eyes: No visual changes. ENT: No sore throat. Cardiovascular: Pounding sensation in chest, +chest pain Respiratory: Denies shortness of breath. Gastrointestinal: No abdominal pain.  No nausea, no vomiting.  No diarrhea.  No constipation. Genitourinary: Negative for dysuria. Musculoskeletal: Negative for back pain. Skin: Negative for rash. Neurological: Negative for headaches, focal weakness or numbness.  10-point ROS otherwise negative.  ____________________________________________   PHYSICAL EXAM:  VITAL SIGNS: ED Triage Vitals  Enc Vitals Group     BP 08/13/16 0138 117/74     Pulse Rate 08/13/16 0138 77     Resp 08/13/16 0138 15     Temp 08/13/16 0138 98.2 F (36.8 C)     Temp Source 08/13/16 0138 Oral     SpO2 08/13/16 0138 100 %     Weight 08/13/16 0140 148 lb (67.1 kg)     Height 08/13/16 0140 6\' 1"  (1.854 m)     Head Circumference --      Peak Flow --      Pain Score --      Pain Loc --  Pain Edu? --      Excl. in GC? --     Constitutional: Alert and oriented. Well appearing and in no acute distress. Eyes: Conjunctivae are normal. PERRL. EOMI. Head: Atraumatic. Nose: No congestion/rhinnorhea. Mouth/Throat: Mucous membranes are moist.  Oropharynx non-erythematous. Neck: No stridor.  No meningeal signs.   Cardiovascular: Normal rate, regular rhythm. Good peripheral circulation. Grossly normal heart sounds. Respiratory: Normal respiratory effort.  No retractions. Lungs CTAB. Gastrointestinal: Soft and nontender. No distention.  Musculoskeletal: No lower extremity  tenderness nor edema. No gross deformities of extremities. Neurologic:  Normal speech and language. No gross focal neurologic deficits are appreciated.  Skin:  Skin is warm, dry and intact. No rash noted. Psychiatric: Mood and affect are flat. Speech and behavior are normal.  ____________________________________________   LABS (all labs ordered are listed, but only abnormal results are displayed)  Labs Reviewed  BASIC METABOLIC PANEL - Abnormal; Notable for the following:       Result Value   Calcium 8.5 (*)    Anion gap 4 (*)    All other components within normal limits  CBC - Abnormal; Notable for the following:    RBC 3.93 (*)    HCT 38.3 (*)    All other components within normal limits  HEPATIC FUNCTION PANEL - Abnormal; Notable for the following:    Total Protein 6.4 (*)    Alkaline Phosphatase 36 (*)    All other components within normal limits  TROPONIN I  CK  ETHANOL  TROPONIN I   ____________________________________________  EKG  ED ECG REPORT #1 I, FORBACH, CORY, the attending physician, personally viewed and interpreted this ECG.  Date: 08/13/2016 EKG Time: 01:37 Rate: 78 Rhythm: normal sinus rhythm QRS Axis: normal Intervals: RSR' in V1 & V2 ST/T Wave abnormalities: Non-specific ST segment / T-wave changes, but no evidence of acute ischemia. Conduction Disturbances: none Narrative Interpretation: unremarkable    ED ECG REPORT I, FORBACH, CORY, the attending physician, personally viewed and interpreted this ECG.  Date: 08/13/2016 EKG Time: 05:28 Rate: 60 Rhythm: normal sinus rhythm QRS Axis: normal Intervals: normal ST/T Wave abnormalities: normal Conduction Disturbances: none Narrative Interpretation: unremarkable  ____________________________________________  RADIOLOGY   Dg Chest 2 View  Result Date: 08/13/2016 CLINICAL DATA:  Pounding chest pain. Unable to sleep. History of HIV. EXAM: CHEST  2 VIEW COMPARISON:  Chest radiograph  July 26, 2016 FINDINGS: Cardiomediastinal silhouette is normal. No pleural effusions or focal consolidations. Similar hyper inflation. Trachea projects midline and there is no pneumothorax. Soft tissue planes and included osseous structures are non-suspicious. Broad thoracic dextroscoliosis. IMPRESSION: No acute cardiopulmonary process. Electronically Signed   By: Awilda Metro M.D.   On: 08/13/2016 03:27    ____________________________________________   PROCEDURES  Procedure(s) performed:   Procedures   Critical Care performed: No ____________________________________________   INITIAL IMPRESSION / ASSESSMENT AND PLAN / ED COURSE  Pertinent labs & imaging results that were available during my care of the patient were reviewed by me and considered in my medical decision making (see chart for details).  I Believe that the patient's symptoms are most likely the result of mild alcohol withdrawal.  He has no significant risk factors for heart disease.  He does not use IV drugs to suggest endocarditis.  His vital signs are completely normal as is his physical exam.  I will give him 1 L of fluids given that he states that he went on a five-mile walk earlier today as well as checking a CK.  His chronic alcohol abuse I am giving him thiamine and folate as well.  I explained to him that we would not be doing any controlled substances such as benzodiazepines since he has Librium at home.  He is comfortable with this plan at this time and states that he just wishes she can get some rest because his heart feels like it is pounding strongly.   Clinical Course  Comment By Time  The patient is now fast asleep.  He continues to complain of chest pain intermittently so we obtained a second EKG which was unremarkable and I am waiting on a second troponin.  I anticipate he will be able to be discharged and follow-up as an outpatient for atypical chest pain and uncomplicated alcohol withdrawal.  He  was prescribed Librium previously and needs to follow up with the outpatient resources he has are not been given.  There is no evidence of acute or emergent medical condition on his medical screening exam tonight. Loleta Roseory Forbach, MD 10/05 [phone]  Second troponin negative.  Will d/c for outpatient follow up. Loleta Roseory Forbach, MD 10/05 0719    ____________________________________________  FINAL CLINICAL IMPRESSION(S) / ED DIAGNOSES  Final diagnoses:  Uncomplicated alcohol withdrawal (HCC)  Atypical chest pain     MEDICATIONS GIVEN DURING THIS VISIT:  Medications  sodium chloride 0.9 % bolus 1,000 mL (0 mLs Intravenous Stopped 08/13/16 0331)  thiamine (VITAMIN B-1) tablet 100 mg (100 mg Oral Given 08/13/16 0215)  folic acid (FOLVITE) tablet 1 mg (1 mg Oral Given 08/13/16 0215)  acetaminophen (TYLENOL) tablet 1,000 mg (1,000 mg Oral Given 08/13/16 0215)     NEW OUTPATIENT MEDICATIONS STARTED DURING THIS VISIT:  New Prescriptions   No medications on file    Modified Medications   No medications on file    Discontinued Medications   No medications on file     Note:  This document was prepared using Dragon voice recognition software and may include unintentional dictation errors.    Loleta Roseory Forbach, MD 08/13/16 [phone]

## 2016-08-13 NOTE — ED Triage Notes (Signed)
Per EMS, patient called out with "pounding" quality CP with no radiation, with 7/10 pain quality.  Pt states he is unable to sleep and the pain started up when he laid down.  He said this has happened before but did not seek tx for it.  Today he walked "5 miles" to his brothers house for dinner and had 5 beers after dinner.  Vitals from EMS are 130/86, P86, 100% on RA.  Patient in NAD as of this triage.

## 2016-08-13 NOTE — ED Notes (Signed)
Pt c/o right sided pressure, repeat EKG performed and MD notified.

## 2016-08-13 NOTE — ED Notes (Signed)
MD at bedside. 

## 2016-08-25 ENCOUNTER — Encounter: Payer: Self-pay | Admitting: Emergency Medicine

## 2016-08-25 ENCOUNTER — Emergency Department
Admission: EM | Admit: 2016-08-25 | Discharge: 2016-08-25 | Disposition: A | Discharge status: Home / Self Care | Payer: Self-pay | Attending: Emergency Medicine | Admitting: Emergency Medicine

## 2016-08-25 DIAGNOSIS — Z5181 Encounter for therapeutic drug level monitoring: Secondary | ICD-10-CM | POA: Insufficient documentation

## 2016-08-25 DIAGNOSIS — F1010 Alcohol abuse, uncomplicated: Secondary | ICD-10-CM | POA: Insufficient documentation

## 2016-08-25 DIAGNOSIS — Z85820 Personal history of malignant melanoma of skin: Secondary | ICD-10-CM | POA: Insufficient documentation

## 2016-08-25 DIAGNOSIS — F17210 Nicotine dependence, cigarettes, uncomplicated: Secondary | ICD-10-CM | POA: Insufficient documentation

## 2016-08-25 DIAGNOSIS — G4700 Insomnia, unspecified: Secondary | ICD-10-CM | POA: Insufficient documentation

## 2016-08-25 DIAGNOSIS — J449 Chronic obstructive pulmonary disease, unspecified: Secondary | ICD-10-CM | POA: Insufficient documentation

## 2016-08-25 DIAGNOSIS — I10 Essential (primary) hypertension: Secondary | ICD-10-CM | POA: Insufficient documentation

## 2016-08-25 DIAGNOSIS — Z21 Asymptomatic human immunodeficiency virus [HIV] infection status: Secondary | ICD-10-CM | POA: Insufficient documentation

## 2016-08-25 LAB — URINE DRUG SCREEN, QUALITATIVE (ARMC ONLY)
Amphetamines, Ur Screen: NOT DETECTED
Barbiturates, Ur Screen: NOT DETECTED
Benzodiazepine, Ur Scrn: NOT DETECTED
Cannabinoid 50 Ng, Ur ~~LOC~~: NOT DETECTED
Cocaine Metabolite,Ur ~~LOC~~: NOT DETECTED
MDMA (Ecstasy)Ur Screen: NOT DETECTED
Methadone Scn, Ur: NOT DETECTED
Opiate, Ur Screen: NOT DETECTED
Phencyclidine (PCP) Ur S: NOT DETECTED
Tricyclic, Ur Screen: NOT DETECTED

## 2016-08-25 LAB — CBC WITH DIFFERENTIAL/PLATELET
Basophils Absolute: 0 10*3/uL (ref 0–0.1)
Basophils Relative: 0 %
Eosinophils Absolute: 0.1 10*3/uL (ref 0–0.7)
Eosinophils Relative: 2 %
HCT: 43.6 % (ref 40.0–52.0)
Hemoglobin: 15.2 g/dL (ref 13.0–18.0)
Lymphocytes Relative: 17 %
Lymphs Abs: 1.2 10*3/uL (ref 1.0–3.6)
MCH: 33.3 pg (ref 26.0–34.0)
MCHC: 35 g/dL (ref 32.0–36.0)
MCV: 95.2 fL (ref 80.0–100.0)
Monocytes Absolute: 0.6 10*3/uL (ref 0.2–1.0)
Monocytes Relative: 8 %
Neutro Abs: 5.2 10*3/uL (ref 1.4–6.5)
Neutrophils Relative %: 73 %
Platelets: 234 10*3/uL (ref 150–440)
RBC: 4.58 MIL/uL (ref 4.40–5.90)
RDW: 13.5 % (ref 11.5–14.5)
WBC: 7.2 10*3/uL (ref 3.8–10.6)

## 2016-08-25 LAB — ETHANOL: Alcohol, Ethyl (B): 70 mg/dL — ABNORMAL HIGH (ref <5)

## 2016-08-25 LAB — COMPREHENSIVE METABOLIC PANEL
ALT: 24 U/L (ref 17–63)
AST: 22 U/L (ref 15–41)
Albumin: 4.6 g/dL (ref 3.5–5.0)
Alkaline Phosphatase: 55 U/L (ref 38–126)
Anion gap: 10 (ref 5–15)
BUN: 6 mg/dL (ref 6–20)
CO2: 26 mmol/L (ref 22–32)
Calcium: 9.4 mg/dL (ref 8.9–10.3)
Chloride: 101 mmol/L (ref 101–111)
Creatinine, Ser: 0.82 mg/dL (ref 0.61–1.24)
GFR calc Af Amer: >60 mL/min (ref >60)
GFR calc non Af Amer: >60 mL/min (ref >60)
Glucose, Bld: 100 mg/dL — ABNORMAL HIGH (ref 65–99)
Potassium: 4.3 mmol/L (ref 3.5–5.1)
Sodium: 137 mmol/L (ref 135–145)
Total Bilirubin: 0.7 mg/dL (ref 0.3–1.2)
Total Protein: 7.9 g/dL (ref 6.5–8.1)

## 2016-08-25 NOTE — ED Triage Notes (Signed)
Pt requesting medical clearance for detox from alcohol.

## 2016-08-25 NOTE — ED Provider Notes (Signed)
[facility] Emergency Department Provider Note        Time seen: ----------------------------------------- 10:52 AM on 08/25/2016 -----------------------------------------    I have reviewed the triage vital signs and the nursing notes.   HISTORY  Chief Complaint Medical Clearance    HPI Ellyn Hack is a 30 y.o. male who presents from hotel requesting detox from alcohol and drugs. Patient presents for medical clearance stating he is going to go to [facility]. He denies complaints other than insomnia. He states his main issue is alcohol abuse. He last drank last night. He denies recent illness or other medical complaints. He is HIV positive, has been taking his medications as prescribed. Past Medical History:  Diagnosis Date  . Cancer (HCC)    Skin  . COPD (chronic obstructive pulmonary disease) (HCC)    borderline  . Foot drop   . GERD (gastroesophageal reflux disease)   . Hypertension     Patient Active Problem List   Diagnosis Date Noted  . Depression 02/05/2016  . Low back pain 02/05/2016  . GERD (gastroesophageal reflux disease) 02/05/2016  . Ankle pain 02/05/2016  . Increased frequency of urination 07/31/2015  . ETOH abuse 04/23/2015  . Hypertension 09/28/2011    Past Surgical History:  Procedure Laterality Date  . HAND SURGERY     left hand  . MANDIBLE FRACTURE SURGERY      Allergies Review of patient's allergies indicates no known allergies.  Social History Social History  Substance Use Topics  . Smoking status: Current Every Day Smoker    Packs/day: 1.00    Types: Cigarettes  . Smokeless tobacco: Never Used  . Alcohol use 3.0 oz/week    5 Cans of beer per week     Comment: 5 40oz bottles daily    Review of Systems Constitutional: Negative for fever.Positive for insomnia Cardiovascular: Negative for chest pain. Respiratory: Negative for shortness of breath. Gastrointestinal: Negative for abdominal pain, vomiting and  diarrhea. Genitourinary: Negative for dysuria. Musculoskeletal: Negative for back pain. Skin: Negative for rash. Neurological: Negative for headaches, focal weakness or numbness. Psychiatric: Positive for alcohol abuse  10-point ROS otherwise negative.  ____________________________________________   PHYSICAL EXAM:  VITAL SIGNS: ED Triage Vitals  Enc Vitals Group     BP 08/25/16 1047 117/79     Pulse Rate 08/25/16 1047 83     Resp 08/25/16 1047 14     Temp 08/25/16 1047 98.2 F (36.8 C)     Temp Source 08/25/16 1047 Oral     SpO2 08/25/16 1047 95 %     Weight 08/25/16 1046 142 lb (64.4 kg)     Height 08/25/16 1047 5\' 2"  (1.575 m)     Head Circumference --      Peak Flow --      Pain Score --      Pain Loc --      Pain Edu? --      Excl. in GC? --     Constitutional: Alert and oriented. Well appearing and in no distress. Eyes: Conjunctivae are normal. PERRL. Normal extraocular movements. ENT   Head: Normocephalic and atraumatic.   Nose: No congestion/rhinnorhea.   Mouth/Throat: Mucous membranes are moist.   Neck: No stridor. Cardiovascular: Normal rate, regular rhythm. No murmurs, rubs, or gallops. Respiratory: Normal respiratory effort without tachypnea nor retractions. Breath sounds are clear and equal bilaterally. No wheezes/rales/rhonchi. Gastrointestinal: Soft and nontender. Normal bowel sounds Musculoskeletal: Nontender with normal range of motion in all extremities. No  lower extremity tenderness nor edema. Neurologic:  Normal speech and language. No gross focal neurologic deficits are appreciated.  Skin:  Skin is warm, dry and intact. No rash noted. Psychiatric: Mood and affect are normal. Speech and behavior are normal.  ____________________________________________  ED COURSE:  Pertinent labs & imaging results that were available during my care of the patient were reviewed by me and considered in my medical decision making (see chart for  details). Clinical Course  Patient presented to the ER for medical clearance. He is medically clear. We will draw blood and discharge the patient.  Procedures ___________________________________________  FINAL ASSESSMENT AND PLAN  Substance abuse  Plan: Patient with substance abuse, appears medically clear for detox.   Emily FilbertWilliams, Jonathan E, MD   Note: This dictation was prepared with Dragon dictation. Any transcriptional errors that result from this process are unintentional    Emily FilbertJonathan E Williams, MD 08/25/16 1054    Emily FilbertJonathan E Williams, MD 08/25/16 1149

## 2016-08-25 NOTE — ED Triage Notes (Signed)
Here for medical clearance, detox from etoh, last drink 2300 08/24/16. States he only slept about 2 hours last night, unable to eat.

## 2016-08-25 NOTE — ED Notes (Signed)
[facility] notified and  faxed discharge paperwork. Pt in lobby at security desk waiting for ride.

## 2016-08-25 NOTE — ED Notes (Signed)
Here for medical clearance, detox from etoh, last drink 2300 08/24/16. States he only slept about 2 hours last night, unable to eat. Pt ambulatory and A/O x 4.

## 2016-09-16 ENCOUNTER — Encounter: Payer: Self-pay | Admitting: Emergency Medicine

## 2016-09-16 ENCOUNTER — Emergency Department
Admission: EM | Admit: 2016-09-16 | Discharge: 2016-09-16 | Disposition: A | Discharge status: Home / Self Care | Payer: Self-pay | Attending: Emergency Medicine | Admitting: Emergency Medicine

## 2016-09-16 DIAGNOSIS — F1010 Alcohol abuse, uncomplicated: Secondary | ICD-10-CM

## 2016-09-16 DIAGNOSIS — F10230 Alcohol dependence with withdrawal, uncomplicated: Secondary | ICD-10-CM | POA: Insufficient documentation

## 2016-09-16 DIAGNOSIS — Z21 Asymptomatic human immunodeficiency virus [HIV] infection status: Secondary | ICD-10-CM | POA: Insufficient documentation

## 2016-09-16 DIAGNOSIS — Z87891 Personal history of nicotine dependence: Secondary | ICD-10-CM | POA: Insufficient documentation

## 2016-09-16 DIAGNOSIS — F10930 Alcohol use, unspecified with withdrawal, uncomplicated: Secondary | ICD-10-CM

## 2016-09-16 LAB — CBC WITH DIFFERENTIAL/PLATELET
Basophils Absolute: 0 10*3/uL (ref 0–0.1)
Basophils Relative: 1 %
Eosinophils Absolute: 0.2 10*3/uL (ref 0–0.7)
Eosinophils Relative: 3 %
HCT: 43.1 % (ref 40.0–52.0)
Hemoglobin: 15 g/dL (ref 13.0–18.0)
Lymphocytes Relative: 22 %
Lymphs Abs: 1.6 10*3/uL (ref 1.0–3.6)
MCH: 33.2 pg (ref 26.0–34.0)
MCHC: 34.8 g/dL (ref 32.0–36.0)
MCV: 95.5 fL (ref 80.0–100.0)
Monocytes Absolute: 0.5 10*3/uL (ref 0.2–1.0)
Monocytes Relative: 7 %
Neutro Abs: 5 10*3/uL (ref 1.4–6.5)
Neutrophils Relative %: 67 %
Platelets: 300 10*3/uL (ref 150–440)
RBC: 4.51 MIL/uL (ref 4.40–5.90)
RDW: 13.3 % (ref 11.5–14.5)
WBC: 7.5 10*3/uL (ref 3.8–10.6)

## 2016-09-16 LAB — COMPREHENSIVE METABOLIC PANEL
ALT: 27 U/L (ref 17–63)
AST: 34 U/L (ref 15–41)
Albumin: 4 g/dL (ref 3.5–5.0)
Alkaline Phosphatase: 69 U/L (ref 38–126)
Anion gap: 12 (ref 5–15)
BUN: 8 mg/dL (ref 6–20)
CO2: 25 mmol/L (ref 22–32)
Calcium: 9 mg/dL (ref 8.9–10.3)
Chloride: 101 mmol/L (ref 101–111)
Creatinine, Ser: 0.72 mg/dL (ref 0.61–1.24)
GFR calc Af Amer: >60 mL/min (ref >60)
GFR calc non Af Amer: >60 mL/min (ref >60)
Glucose, Bld: 109 mg/dL — ABNORMAL HIGH (ref 65–99)
Potassium: 4 mmol/L (ref 3.5–5.1)
Sodium: 138 mmol/L (ref 135–145)
Total Bilirubin: 0.4 mg/dL (ref 0.3–1.2)
Total Protein: 8.2 g/dL — ABNORMAL HIGH (ref 6.5–8.1)

## 2016-09-16 LAB — ETHANOL: Alcohol, Ethyl (B): 108 mg/dL — ABNORMAL HIGH (ref <5)

## 2016-09-16 MED ORDER — LORAZEPAM 2 MG/ML IJ SOLN
0.0000 mg | Freq: Two times a day (BID) | INTRAMUSCULAR | Status: DC
Start: 2016-09-18 — End: 2016-09-16

## 2016-09-16 MED ORDER — LORAZEPAM 2 MG PO TABS: 0.0000 mg | ORAL_TABLET | Freq: Two times a day (BID) | ORAL | Status: DC

## 2016-09-16 MED ORDER — FOLIC ACID 1 MG PO TABS: 1.0000 mg | ORAL_TABLET | Freq: Once | ORAL | Status: DC

## 2016-09-16 MED ORDER — LORAZEPAM 2 MG PO TABS
0.0000 mg | ORAL_TABLET | Freq: Four times a day (QID) | ORAL | Status: DC
  Administered 2016-09-16: 2 mg via ORAL
  Filled 2016-09-16: qty 1

## 2016-09-16 MED ORDER — ELVITEG-COBIC-EMTRICIT-TENOFAF 150-150-200-10 MG PO TABS
1.0000 | ORAL_TABLET | Freq: Every day | ORAL | Status: DC
  Administered 2016-09-16: 1 via ORAL
  Filled 2016-09-16: qty 1

## 2016-09-16 MED ORDER — LORAZEPAM 2 MG/ML IJ SOLN
0.0000 mg | Freq: Four times a day (QID) | INTRAMUSCULAR | Status: DC
  Administered 2016-09-16: 2 mg via INTRAVENOUS
  Filled 2016-09-16: qty 2

## 2016-09-16 MED ORDER — CHLORDIAZEPOXIDE HCL 10 MG PO CAPS: ORAL_CAPSULE | ORAL | 0 refills | Status: DC

## 2016-09-16 MED ORDER — FOLIC ACID 1 MG PO TABS
1.0000 mg | ORAL_TABLET | Freq: Once | ORAL | Status: AC
  Administered 2016-09-16: 1 mg via ORAL
  Filled 2016-09-16: qty 1

## 2016-09-16 MED ORDER — VITAMIN B-1 100 MG PO TABS
100.0000 mg | ORAL_TABLET | Freq: Every day | ORAL | Status: DC
  Administered 2016-09-16: 100 mg via ORAL
  Filled 2016-09-16: qty 1

## 2016-09-16 NOTE — ED Provider Notes (Signed)
[facility] Emergency Department Provider Note   ____________________________________________   First MD Initiated Contact with Patient 09/16/16 [phone]     (approximate)  I have reviewed the triage vital signs and the nursing notes.   HISTORY  Chief Complaint Medical Clearance and Tremors   HPI Gary [name] is a 30 y.o. male history of HIV, currently compliant with his daily HIV medicine, here for "detox".  Patient denies when arm himself or anyone else. He has been drinking heavily since "falling off the wagon" a few days before his birthday. He reports he drinks 12-24 beers daily. His last drink was yesterday evening, and he started having shaking nausea and "withdrawals". No history of withdrawal seizure, reports that he successfully went through detox about one month ago at residential treatment services for same.  Denies confusion or hallucinations. No fevers or other recent illness.  Patient is distressed and in going to detox.  Past Medical History:  Diagnosis Date  . HIV (human immunodeficiency virus infection) (HCC)     There are no active problems to display for this patient.   History reviewed. No pertinent surgical history.  Prior to Admission medications   Medication Sig Start Date End Date Taking? Authorizing Provider  GENVOYA 150-150-200-10 MG TABS tablet Take 1 tablet by mouth daily. 06/30/16  Yes Historical Provider, MD    Allergies Patient has no known allergies.  History reviewed. No pertinent family history.  Social History Social History  Substance Use Topics  . Smoking status: Former Smoker    Packs/day: 0.50    Types: Cigarettes  . Smokeless tobacco: Never Used  . Alcohol use Yes     Comment: 18pk/day    Review of Systems Constitutional: No fever/chills Eyes: No visual changes. ENT: No sore throat. Cardiovascular: Denies chest pain. Respiratory: Denies shortness of breath. Gastrointestinal: No abdominal  pain.  No vomiting.  No diarrhea.  No constipation. Genitourinary: Negative for dysuria. Musculoskeletal: Negative for back pain. Skin: Negative for rash. Neurological: Negative for headaches, focal weakness or numbness. Does report shakiness  10-point ROS otherwise negative.  ____________________________________________   PHYSICAL EXAM:  VITAL SIGNS: ED Triage Vitals  Enc Vitals Group     BP 09/16/16 0356 (!) 117/91     Pulse Rate 09/16/16 0356 (!) 106     Resp 09/16/16 0356 20     Temp 09/16/16 0356 98.5 F (36.9 C)     Temp Source 09/16/16 0356 Oral     SpO2 09/16/16 0356 100 %     Weight 09/16/16 0357 145 lb (65.8 kg)     Height 09/16/16 0357 6\' 1"  (1.854 m)     Head Circumference --      Peak Flow --      Pain Score 09/16/16 0355 6     Pain Loc --      Pain Edu? --      Excl. in GC? --     Constitutional: Alert and oriented. Slightly tremulous, slightly diaphoretic. Eyes: Conjunctivae are normal. PERRL. EOMI. Head: Atraumatic. Nose: No congestion/rhinnorhea. Mouth/Throat: Mucous membranes are moist.  Oropharynx non-erythematous. Neck: No stridor.   Cardiovascular: Minimally tachycardic, regular rhythm. Grossly normal heart sounds.  Good peripheral circulation. Respiratory: Normal respiratory effort.  No retractions. Lungs CTAB. Gastrointestinal: Soft and nontender. No distention. Musculoskeletal: No lower extremity tenderness nor edema.  No joint effusions. Neurologic:  Normal speech and language. No gross focal neurologic deficits are appreciated. No nystagmus. Normal orientation. Patient does have bilateral high-frequency tremor  in upper extremities as well as mild hyperreflexia and lower extremities equal bilateral. No focal deficits. Skin:  Skin is warm, dry and intact. No rash noted. Psychiatric: Mood and affect are normal. Speech and behavior are normal.  ____________________________________________   LABS (all labs ordered are listed, but only abnormal  results are displayed)  Labs Reviewed  COMPREHENSIVE METABOLIC PANEL - Abnormal; Notable for the following:       Result Value   Glucose, Bld 109 (*)    Total Protein 8.2 (*)    All other components within normal limits  ETHANOL - Abnormal; Notable for the following:    Alcohol, Ethyl (B) 108 (*)    All other components within normal limits  CBC WITH DIFFERENTIAL/PLATELET  URINE DRUG SCREEN, QUALITATIVE ([facility] ONLY)   ____________________________________________  EKG   ____________________________________________  RADIOLOGY   ____________________________________________   PROCEDURES  Procedure(s) performed: None  Procedures  Critical Care performed: No  ____________________________________________   INITIAL IMPRESSION / ASSESSMENT AND PLAN / ED COURSE  Pertinent labs & imaging results that were available during my care of the patient were reviewed by me and considered in my medical decision making (see chart for details).  Patient presents for evaluation for alcohol withdrawal. Exam and clinical history appear consistent with same. Patient has mild to moderate symptoms are present without evidence of delirium tremens, alteration in mental status, seizure or previous history of Complicated withdrawal. Patient is voluntary and denies acute psychiatric symptoms.  ----------------------------------------- 6:24 AM on 09/16/2016 -----------------------------------------  Patient improving with Ativan, withdrawal score down to a 4. He is resting comfortably at present. Await consultation from [facility]. At present patient appears appropriate for outpatient detox treatment and therapy.   Clinical Course    ----------------------------------------- 8:19 AM on 09/16/2016 -----------------------------------------  Patient resting comfortably. Ongoing care assigned to Dr. Lenard LancePaduchowski  ____________________________________________   FINAL CLINICAL IMPRESSION(S) / ED  DIAGNOSES  Final diagnoses:  Alcohol withdrawal syndrome without complication (HCC)      NEW MEDICATIONS STARTED DURING THIS VISIT:  New Prescriptions   No medications on file     Note:  This document was prepared using Dragon voice recognition software and may include unintentional dictation errors.     Sharyn CreamerMark Duran Ohern, MD 09/16/16 [phone]

## 2016-09-16 NOTE — ED Triage Notes (Signed)
Pt arrived via EMS from home c/o tremors and body aches. Pt also requesting detox. Pt reports that he started drinking on Friday, last drink was last night at 1800. Pt reports that he drinks approximately a 12 pack per day. Pt reports that he went through detox about a month ago. Pt denies any hx of seizures when going through detox in the past. Pt presents to the ER this morning with significant tremors in bilateral upper extremities, tremors are present while arms are resting on pt's abdomen. Pt reports nausea and headache for the last several hours. Pt is alert and oriented.

## 2016-09-16 NOTE — BH Assessment (Signed)
Writer discussed with the patient about the option of [facility]. Patient was in the agreement with the plan. Patient signed the Release of information, in order to send labs and ER Note.   Writer contacted [facility] ([phone]), and they stated the patient wasn't eligible for admission at this time. He was recently with them. He was discharged on 08/28/2016. He wasn't a problem while there, but the timeframe for re-admission haven't been met at this time (Haven't been over 30 days).  Writer spoke, with [facility] Otila Kluver T., [phone]) and the Observation Unit isn't an option at this time.  Writer provided the patient with information for [facility] and the contact information for [facility] Peer Support Specialist Lanae Boast B.). Also gave the patient information for [facility] and instructions for their walk in facility.  Writer updated ER MD (Dr. Kerman Passey).

## 2016-09-16 NOTE — ED Notes (Signed)
BEHAVIORAL HEALTH ROUNDING Patient sleeping: No. Patient alert and oriented: yes Behavior appropriate: Yes.  ; If no, describe:  Nutrition and fluids offered: yes Toileting and hygiene offered: Yes  Sitter present: q15 minute observations and security  monitoring Law enforcement present: Yes  ODS  

## 2016-09-16 NOTE — ED Notes (Signed)
BEHAVIORAL HEALTH ROUNDING Patient sleeping: No. Patient alert and oriented: yes Behavior appropriate: Yes.  ; If no, describe:  Nutrition and fluids offered: yes Toileting and hygiene offered: Yes  Sitter present: q15 minute observations and security monitoring Law enforcement present: Yes  ODS   ENVIRONMENTAL ASSESSMENT Potentially harmful objects out of patient reach: Yes.   Personal belongings secured: Yes.   Patient dressed in hospital provided attire only: Yes.   Plastic bags out of patient reach: Yes.   Patient care equipment (cords, cables, call bells, lines, and drains) shortened, removed, or accounted for: Yes.   Equipment and supplies removed from bottom of stretcher: Yes.   Potentially toxic materials out of patient reach: Yes.   Sharps container removed or out of patient reach: Yes.   

## 2016-09-16 NOTE — BH Assessment (Signed)
Assessment Note  Gary [name] is an 30 y.o. male who presents to the ER seeking assistance with Alcohol Detox. For the last week, he's drank on a daily basis in the amount of 12, 24 oz beers. His reported symptoms of withdrawal are; shakes, cold chills, some aches and pains. He denies having a history of seizures or blackouts. He admits to smoking cannabis as well. He states it's approximately one to two times a month, in the amount of "just one puff." The last time he smoked anything was approximately a week ago. He also admits to having tried cocaine approximately two years ago. Per his report, it was an isolated event.  He denies the use of any other mind-altering substance.  Patient denies SI/HI and AV/H.  He denies current involvement with the legal system and with DSS.  During the interview, he was pleasant, calm and cooperative. He denies having a history of violence and aggression. Diagnosis: Alcohol Use Disorder, Severe  Past Medical History:  Past Medical History:  Diagnosis Date  . HIV (human immunodeficiency virus infection) (HCC)     History reviewed. No pertinent surgical history.  Family History: History reviewed. No pertinent family history.  Social History:  reports that he has quit smoking. His smoking use included Cigarettes. He smoked 0.50 packs per day. He has never used smokeless tobacco. He reports that he drinks alcohol. He reports that he uses drugs, including Marijuana.  Additional Social History:  Alcohol / Drug Use Pain Medications: See PTA Prescriptions: See PTA Over the Counter: See PTA History of alcohol / drug use?: Yes Longest period of sobriety (when/how long): "A year" Negative Consequences of Use: Personal relationships Withdrawal Symptoms: Fever / Chills, Tremors, Nausea / Vomiting, Sweats Substance #1 Name of Substance 1: Alcohol 1 - Age of First Use: 19 1 - Amount (size/oz): 12-24oz beers 1 - Frequency: Daily 1 - Duration: Recent relapse,  drinking for approximately a week 1 - Last Use / Amount: 09/15/2016  CIWA: CIWA-Ar BP: (!) 117/91 Pulse Rate: 94 Nausea and Vomiting: intermittent nausea with dry heaves Tactile Disturbances: very mild itching, pins and needles, burning or numbness Tremor: moderate, with patient's arms extended Auditory Disturbances: not present Paroxysmal Sweats: beads of sweat obvious on forehead Visual Disturbances: very mild sensitivity Anxiety: moderately anxious, or guarded, so anxiety is inferred Headache, Fullness in Head: moderate Agitation: somewhat more than normal activity Orientation and Clouding of Sensorium: oriented and can do serial additions CIWA-Ar Total: 22 COWS:    Allergies: No Known Allergies  Home Medications:  (Not in a hospital admission)  OB/GYN Status:  No LMP for male patient.  General Assessment Data [facility] Assessment: In system Is this a Tele or Face-to-Face Assessment?: Face-to-Face Is this an Initial Assessment or a Re-assessment for this encounter?: Initial Assessment Marital status: Single Maiden name: n/a Is patient pregnant?: No Pregnancy Status: No Living Arrangements: Non-relatives/Friends Can pt return to current living arrangement?: No Admission Status: Voluntary Is patient capable of signing voluntary admission?: Yes Referral Source: Self/Family/Friend Insurance type: None  Medical Screening Exam [facility] ONLY) Medical Exam completed: Yes  Crisis Care Plan Living Arrangements: Non-relatives/Friends Legal Guardian: Other: (None) Name of Psychiatrist: Reports of none Name of Therapist: Reports of none  Education Status Is patient currently in school?: No Current Grade: n/a Highest grade of school patient has completed: 12th Grade Name of school: n/a Contact person: n/a  Risk to self with the past 6 months Suicidal Ideation: No  Has patient been a risk to self within the past 6 months prior to admission?  : No Suicidal Intent: No Has patient had any suicidal intent within the past 6 months prior to admission? : No Is patient at risk for suicide?: No Suicidal Plan?: No Has patient had any suicidal plan within the past 6 months prior to admission? : No Access to Means: No What has been your use of drugs/alcohol within the last 12 months?: Alcohol & Cannabis Previous Attempts/Gestures: No How many times?: 0 Other Self Harm Risks: n/a Triggers for Past Attempts: None known Intentional Self Injurious Behavior: None Family Suicide History: No Recent stressful life event(s): Other (Comment) (Active Addiction) Persecutory voices/beliefs?: No Depression: Yes Depression Symptoms: Feeling worthless/self pity, Isolating (Active Addiction ) Substance abuse history and/or treatment for substance abuse?: Yes Suicide prevention information given to non-admitted patients: Not applicable  Risk to Others within the past 6 months Homicidal Ideation: No Does patient have any lifetime risk of violence toward others beyond the six months prior to admission? : No Thoughts of Harm to Others: No Current Homicidal Intent: No Current Homicidal Plan: No Access to Homicidal Means: No Identified Victim: Reports of none History of harm to others?: No Assessment of Violence: None Noted Violent Behavior Description: Reports of none Does patient have access to weapons?: No Criminal Charges Pending?: No Does patient have a court date: No Is patient on probation?: No  Psychosis Hallucinations: None noted Delusions: None noted  Mental Status Report Appearance/Hygiene: Unremarkable, Other (Comment) (In personal clothes) Eye Contact: Good Motor Activity: Freedom of movement, Unremarkable Speech: Logical/coherent Level of Consciousness: Alert Mood: Pleasant Affect: Appropriate to circumstance Anxiety Level: None Thought Processes: Coherent, Relevant Judgement: Unimpaired Orientation: Person, Place, Time,  Situation, Appropriate for developmental age Obsessive Compulsive Thoughts/Behaviors: None  Cognitive Functioning Concentration: Normal Memory: Recent Intact, Remote Intact IQ: Average Insight: Good Impulse Control: Poor Appetite: Fair Weight Loss: 0 Weight Gain: 0 Sleep: No Change Total Hours of Sleep: 7 Vegetative Symptoms: None  ADLScreening [facility] Assessment Services) Patient's cognitive ability adequate to safely complete daily activities?: Yes Patient able to express need for assistance with ADLs?: Yes Independently performs ADLs?: Yes (appropriate for developmental age)  Prior Inpatient Therapy Prior Inpatient Therapy: Yes Prior Therapy Dates: 08/2016 [facility]Provider[facility] Reason for Treatment: Alcohol Detox  Prior Outpatient Therapy Prior Outpatient Therapy: No Prior Therapy Dates: Reports of none  Prior Therapy Facilty/Provider(s): Reports of none  Reason for Treatment: Reports of none  Does patient have an ACCT team?: No Does patient have Intensive In-House Services?  : No Does patient have [facility] services? : No Does patient have P4CC services?: No  ADL Screening (condition at time of admission) Patient's cognitive ability adequate to safely complete daily activities?: Yes Is the patient deaf or have difficulty hearing?: No Does the patient have difficulty seeing, even when wearing glasses/contacts?: No Does the patient have difficulty concentrating, remembering, or making decisions?: No Patient able to express need for assistance with ADLs?: Yes Does the patient have difficulty dressing or bathing?: No Independently performs ADLs?: Yes (appropriate for developmental age) Does the patient have difficulty walking or climbing stairs?: No Weakness of Legs: None Weakness of Arms/Hands: None  Home Assistive Devices/Equipment Home Assistive Devices/Equipment: None  Therapy Consults (therapy consults require a  physician order) PT Evaluation Needed: No OT Evalulation Needed: No SLP Evaluation Needed: No Abuse/Neglect Assessment (Assessment to be complete while patient is alone) Physical Abuse: Denies Verbal Abuse: Denies Sexual Abuse:  Denies Exploitation of patient/patient's resources: Denies Self-Neglect: Denies Values / Beliefs Cultural Requests During Hospitalization: None Spiritual Requests During Hospitalization: None Consults Spiritual Care Consult Needed: No Social Work Consult Needed: No Merchant navy officerAdvance Directives (For Healthcare) Does patient have an advance directive?: No    Additional Information 1:1 In Past 12 Months?: No CIRT Risk: No Elopement Risk: No Does patient have medical clearance?: Yes  Child/Adolescent Assessment Running Away Risk: Denies (Patient is an adult)  Disposition:  Disposition Initial Assessment Completed for this Encounter: Yes Disposition of Patient: Referred to Patient referred to: [facility]  On Site Evaluation by:   Reviewed with Physician:    Lilyan Gilfordalvin J. Manning MS, LCAS, LPC, NCC, CCSI Therapeutic Triage Specialist 09/16/2016 11:28 AM

## 2016-09-16 NOTE — ED Notes (Signed)
ED BHU PLACEMENT JUSTIFICATION Is the patient under IVC or is there intent for IVC:  voluntary Is the patient medically cleared: Yes.   Is there vacancy in the ED BHU: Yes.   Is the population mix appropriate for patient: Yes.   Is the patient awaiting placement in inpatient or outpatient setting:  Detox placement Has the patient had a psychiatric consult: Yes.   Survey of unit performed for contraband, proper placement and condition of furniture, tampering with fixtures in bathroom, shower, and each patient room: Yes.  ; Findings:  APPEARANCE/BEHAVIOR Calm and cooperative NEURO ASSESSMENT Orientation: oriented x 4 Denies pain Hallucinations: No.None noted (Hallucinations) Speech: Normal Gait: normal RESPIRATORY ASSESSMENT Even  Unlabored respirations  CARDIOVASCULAR ASSESSMENT Pulses equal   regular rate  Skin warm and dry   GASTROINTESTINAL ASSESSMENT no GI complaint EXTREMITIES Full ROM  PLAN OF CARE Provide calm/safe environment. Vital signs assessed twice daily. ED BHU Assessment once each 12-hour shift. Collaborate with TTS daily or as condition indicates. Assure the ED provider has rounded once each shift. Provide and encourage hygiene. Provide redirection as needed. Assess for escalating behavior; address immediately and inform ED provider.  Assess family dynamic and appropriateness for visitation as needed: Yes.  ; If necessary, describe findings:  Educate the patient/family about BHU procedures/visitation: Yes.  ; If necessary, describe findings:

## 2016-09-16 NOTE — ED Notes (Signed)
Pt observed with no unusual behavior  Appropriate to stimulation  No verbalized needs or concerns at this time  NAD assessed  Continue to monitor 

## 2016-09-16 NOTE — ED Provider Notes (Signed)
-----------------------------------------   12:37 PM on 09/16/2016 -----------------------------------------  TTS has been unable to place the patient as he was just recently at [facility]. As we do not have any facility available for the patient, patient will be discharged from the emergency department with outpatient resources. I will also prescribe a Librium taper for the patient. I discussed this medication with the patient, he knows that he cannot drink alcohol while taking this medication. Patient states he will follow-up with outpatient resources provided by [facility].   Minna AntisKevin Paduchowski, MD 09/16/16 [phone]

## 2016-09-16 NOTE — ED Notes (Signed)
Breakfast was given to patient. 

## 2016-09-18 ENCOUNTER — Emergency Department
Admission: EM | Admit: 2016-09-18 | Discharge: 2016-09-18 | Disposition: A | Discharge status: Home / Self Care | Payer: Self-pay | Attending: Emergency Medicine | Admitting: Emergency Medicine

## 2016-09-18 DIAGNOSIS — Z87891 Personal history of nicotine dependence: Secondary | ICD-10-CM | POA: Insufficient documentation

## 2016-09-18 DIAGNOSIS — Z79899 Other long term (current) drug therapy: Secondary | ICD-10-CM | POA: Insufficient documentation

## 2016-09-18 DIAGNOSIS — K6289 Other specified diseases of anus and rectum: Secondary | ICD-10-CM | POA: Insufficient documentation

## 2016-09-18 DIAGNOSIS — Z21 Asymptomatic human immunodeficiency virus [HIV] infection status: Secondary | ICD-10-CM | POA: Insufficient documentation

## 2016-09-18 MED ORDER — PRAMOXINE-HC 1-2.5 % EX CREA: TOPICAL_CREAM | Freq: Three times a day (TID) | CUTANEOUS | 0 refills | Status: DC

## 2016-09-18 NOTE — Discharge Instructions (Signed)
Begin taking sitz baths to reduce pain. Proctofoam as directed. Follow-up with your primary care doctor or make an appointment with Dr. Earlean PolkaSolik who is the gastroenterologist on call today. Call and make an appointment with either of these doctors. He may take Tylenol as needed for pain. Do not strain while taking a bowel movement.

## 2016-09-18 NOTE — ED Provider Notes (Signed)
[facility] Emergency Department Provider Note   ____________________________________________   First MD Initiated Contact with Patient 09/18/16 1232     (approximate)  I have reviewed the triage vital signs and the nursing notes.   HISTORY  Chief Complaint Rectal Pain    HPI Gary [name] is a 30 y.o. male is here today with complaint of what he believes to be hemorrhoids. Patient states he has seen some blood with bowel movements on toilet tissue. He denies any straining or constipation. He states that one year ago he was in the emergency room and had an abscess that was drained. He was told to follow up with surgeon which he did not do. Patient has not taken any over-the-counter medication nor has he done any sitz baths prior to arrival to the emergency room. Currently he rates his pain as an 8/10.  Pain is increased with sitting. He states there is not anything so far this helped with his pain. He denies any fever, chills, nausea, vomiting or difficulty with urination. Patient has positive history for HIV, alcohol abuse, and recreational drugs.   Past Medical History:  Diagnosis Date  . HIV (human immunodeficiency virus infection) (HCC)     There are no active problems to display for this patient.   History reviewed. No pertinent surgical history.  Prior to Admission medications   Medication Sig Start Date End Date Taking? Authorizing Provider  chlordiazePOXIDE (LIBRIUM) 10 MG capsule Day 1-2 Take 1 tablet PO TID Day 3-4 Take 1 tablet PO BID Day 5-6 Take 1 tablet PO QD Day 7 STOP 09/16/16   Minna AntisKevin Paduchowski, MD  GENVOYA 150-150-200-10 MG TABS tablet Take 1 tablet by mouth daily. 06/30/16   Historical Provider, MD  pramoxine-hydrocortisone cream Apply topically 3 (three) times daily. 09/18/16   Tommi Rumpshonda L Summers, PA-C    Allergies Patient has no known allergies.  No family history on file.  Social History Social History  Substance Use  Topics  . Smoking status: Former Smoker    Packs/day: 0.50    Types: Cigarettes  . Smokeless tobacco: Never Used  . Alcohol use Yes     Comment: 18pk/day    Review of Systems Constitutional: No fever/chills Cardiovascular: Denies chest pain. Respiratory: Denies shortness of breath. Gastrointestinal: No abdominal pain.  No nausea, no vomiting.  No diarrhea.  No constipation. Positive rectal pain. Genitourinary: Negative for dysuria. Musculoskeletal: Negative for back pain. Skin: Positive for possible hemorrhoids. Neurological: Negative for headaches, focal weakness or numbness.  10-point ROS otherwise negative.  ____________________________________________   PHYSICAL EXAM:  VITAL SIGNS: ED Triage Vitals [09/18/16 1208]  Enc Vitals Group     BP 113/69     Pulse Rate 96     Resp 16     Temp 98.2 F (36.8 C)     Temp Source Oral     SpO2 98 %     Weight 145 lb (65.8 kg)     Height 6\' 1"  (1.854 m)     Head Circumference      Peak Flow      Pain Score 8     Pain Loc      Pain Edu?      Excl. in GC?     Constitutional: Alert and oriented. Well appearing and in no acute distress. Eyes: Conjunctivae are normal. PERRL. EOMI. Head: Atraumatic. Nose: No congestion/rhinnorhea. Neck: No stridor.   Cardiovascular: Normal rate, regular rhythm. Grossly normal heart sounds.  Good peripheral  circulation. Respiratory: Normal respiratory effort.  No retractions. Lungs CTAB. Gastrointestinal: Soft and nontender. No distention. Bowel sounds normoactive 4 quadrants. External rectal exam does not show any hemorrhoids or active bleeding. There is a questionable small fissure noted at approximately 6 clock. There is no present bleeding. Digital exam no internal hemorrhoids were appreciated. Hemoccult slide was negative for occult blood. Prostate nontender and no enlargement noted. Musculoskeletal: Moves upper and lower extremities without any difficulty. Normal gait was noted.    Neurologic:  Normal speech and language. No gross focal neurologic deficits are appreciated. No gait instability. Skin:  Skin is warm, dry and intact. No rash noted.  Psychiatric: Mood and affect are normal. Speech and behavior are normal.  ____________________________________________   LABS (all labs ordered are listed, but only abnormal results are displayed)  Labs Reviewed - No data to display  PROCEDURES  Procedure(s) performed: None  Procedures  Critical Care performed: No  ____________________________________________   INITIAL IMPRESSION / ASSESSMENT AND PLAN / ED COURSE  Pertinent labs & imaging results that were available during my care of the patient were reviewed by me and considered in my medical decision making (see chart for details).    Clinical Course    Patient was reassured that he did not have hemorrhoids externally and that there was none appreciated on internal exam. Hemoccult was negative. Patient was given a prescription for Proctofoam and and also encouraged to do sitz baths. He'll follow-up with Dr. Sampson GoonFitzgerald or the gastroenterologist listed on his discharge papers. Patient was also encouraged to continue taking medication as directed.  ____________________________________________   FINAL CLINICAL IMPRESSION(S) / ED DIAGNOSES  Final diagnoses:  Rectal pain      NEW MEDICATIONS STARTED DURING THIS VISIT:  Discharge Medication List as of 09/18/2016  1:10 PM    START taking these medications   Details  pramoxine-hydrocortisone cream Apply topically 3 (three) times daily., Starting Fri 09/18/2016, Print         Note:  This document was prepared using Dragon voice recognition software and may include unintentional dictation errors.    Tommi Rumpshonda L Summers, PA-C 09/18/16 1329    Phineas SemenGraydon Goodman, MD 09/18/16 [phone]

## 2016-09-18 NOTE — ED Triage Notes (Signed)
Pt c/o rectal pain X 2 days, small amount of bleeding when having BM. Pain when sitting directly onto buttocks. Pt alert and oriented X4, active, cooperative, pt in NAD. RR even and unlabored, color WNL.

## 2016-09-23 ENCOUNTER — Encounter: Payer: Self-pay | Admitting: *Deleted

## 2016-09-23 ENCOUNTER — Observation Stay
Admission: EM | Admit: 2016-09-23 | Discharge: 2016-09-25 | Disposition: A | Discharge status: Home / Self Care | Payer: Self-pay | Attending: Internal Medicine | Admitting: Internal Medicine

## 2016-09-23 DIAGNOSIS — R590 Localized enlarged lymph nodes: Secondary | ICD-10-CM | POA: Insufficient documentation

## 2016-09-23 DIAGNOSIS — F1290 Cannabis use, unspecified, uncomplicated: Secondary | ICD-10-CM | POA: Insufficient documentation

## 2016-09-23 DIAGNOSIS — Z21 Asymptomatic human immunodeficiency virus [HIV] infection status: Secondary | ICD-10-CM | POA: Insufficient documentation

## 2016-09-23 DIAGNOSIS — F10930 Alcohol use, unspecified with withdrawal, uncomplicated: Secondary | ICD-10-CM

## 2016-09-23 DIAGNOSIS — R739 Hyperglycemia, unspecified: Secondary | ICD-10-CM | POA: Insufficient documentation

## 2016-09-23 DIAGNOSIS — F10230 Alcohol dependence with withdrawal, uncomplicated: Secondary | ICD-10-CM

## 2016-09-23 DIAGNOSIS — F17210 Nicotine dependence, cigarettes, uncomplicated: Secondary | ICD-10-CM | POA: Insufficient documentation

## 2016-09-23 DIAGNOSIS — K611 Rectal abscess: Principal | ICD-10-CM | POA: Diagnosis present

## 2016-09-23 LAB — CBC WITH DIFFERENTIAL/PLATELET
Basophils Absolute: 0 10*3/uL (ref 0–0.1)
Basophils Relative: 0 %
Eosinophils Absolute: 0.2 10*3/uL (ref 0–0.7)
Eosinophils Relative: 2 %
HCT: 38.8 % — ABNORMAL LOW (ref 40.0–52.0)
Hemoglobin: 13.5 g/dL (ref 13.0–18.0)
Lymphocytes Relative: 17 %
Lymphs Abs: 1.2 10*3/uL (ref 1.0–3.6)
MCH: 33 pg (ref 26.0–34.0)
MCHC: 34.9 g/dL (ref 32.0–36.0)
MCV: 94.6 fL (ref 80.0–100.0)
Monocytes Absolute: 0.8 10*3/uL (ref 0.2–1.0)
Monocytes Relative: 11 %
Neutro Abs: 5.2 10*3/uL (ref 1.4–6.5)
Neutrophils Relative %: 70 %
Platelets: 221 10*3/uL (ref 150–440)
RBC: 4.1 MIL/uL — ABNORMAL LOW (ref 4.40–5.90)
RDW: 13.4 % (ref 11.5–14.5)
WBC: 7.4 10*3/uL (ref 3.8–10.6)

## 2016-09-23 LAB — COMPREHENSIVE METABOLIC PANEL
ALT: 16 U/L — ABNORMAL LOW (ref 17–63)
AST: 15 U/L (ref 15–41)
Albumin: 3.8 g/dL (ref 3.5–5.0)
Alkaline Phosphatase: 65 U/L (ref 38–126)
Anion gap: 8 (ref 5–15)
BUN: 8 mg/dL (ref 6–20)
CO2: 27 mmol/L (ref 22–32)
Calcium: 8.8 mg/dL — ABNORMAL LOW (ref 8.9–10.3)
Chloride: 100 mmol/L — ABNORMAL LOW (ref 101–111)
Creatinine, Ser: 0.75 mg/dL (ref 0.61–1.24)
GFR calc Af Amer: >60 mL/min (ref >60)
GFR calc non Af Amer: >60 mL/min (ref >60)
Glucose, Bld: 101 mg/dL — ABNORMAL HIGH (ref 65–99)
Potassium: 3.9 mmol/L (ref 3.5–5.1)
Sodium: 135 mmol/L (ref 135–145)
Total Bilirubin: 0.5 mg/dL (ref 0.3–1.2)
Total Protein: 8.1 g/dL (ref 6.5–8.1)

## 2016-09-23 LAB — ETHANOL: Alcohol, Ethyl (B): <5 mg/dL (ref <5)

## 2016-09-23 MED ORDER — LIDOCAINE HCL 2 % EX GEL
1.0000 "application " | Freq: Once | CUTANEOUS | Status: AC
  Administered 2016-09-23: 1 via TOPICAL
  Filled 2016-09-23: qty 5

## 2016-09-23 MED ORDER — IOPAMIDOL (ISOVUE-300) INJECTION 61%
30.0000 mL | Freq: Once | INTRAVENOUS | Status: AC
  Administered 2016-09-23: 30 mL via ORAL

## 2016-09-23 MED ORDER — LORAZEPAM 2 MG/ML IJ SOLN
2.0000 mg | Freq: Once | INTRAMUSCULAR | Status: AC
  Administered 2016-09-23: 2 mg via INTRAVENOUS
  Filled 2016-09-23: qty 1

## 2016-09-23 MED ORDER — SODIUM CHLORIDE 0.9 % IV BOLUS (SEPSIS)
1000.0000 mL | Freq: Once | INTRAVENOUS | Status: AC
  Administered 2016-09-23: 1000 mL via INTRAVENOUS

## 2016-09-23 MED ORDER — LIDOCAINE 5 % EX OINT
TOPICAL_OINTMENT | Freq: Once | CUTANEOUS | Status: DC
  Filled 2016-09-23: qty 35.44

## 2016-09-23 NOTE — ED Provider Notes (Signed)
Time Seen: Approximately2119  I have reviewed the triage notes  Chief Complaint: Abscess and Withdrawal   History of Present Illness: Gary [name] is a 30 y.o. male who presents with a 2 separate complaintsa. Patient presents with a history of alcohol addiction. He states he drinks approximately a 12 pack of alcohol per day. He also has history medically of HIV and a perirectal abscess. He states he's had previous incision and drainage of his abscess. He denies any fever at home and states increased rectal pain. He states he has some generalized shaking without hallucinations. Some feelings of agitation and heart palpitations.  Past Medical History:  Diagnosis Date  . HIV (human immunodeficiency virus infection) (HCC)     There are no active problems to display for this patient.   No past surgical history on file.  No past surgical history on file.  Current Outpatient Rx  . Order #: [id number] Class: Print  . Order #: [id number] Class: Historical Med  . Order #: [id number] Class: Print    Allergies:  Patient has no known allergies.  Family History: No family history on file.  Social History: Social History  Substance Use Topics  . Smoking status: Current Every Day Smoker    Packs/day: 0.50    Types: Cigarettes  . Smokeless tobacco: Never Used  . Alcohol use Yes     Comment: 18pk/day     Review of Systems:   10 point review of systems was performed and was otherwise negative:  Constitutional: No fever Eyes: No visual disturbances ENT: No sore throat, ear pain Cardiac: No chest pain Respiratory: No shortness of breath, wheezing, or stridor Abdomen: No abdominal pain, no vomiting, No diarrhea Endocrine: No weight loss, No night sweats Extremities: No peripheral edema, cyanosis Skin: No rashes, easy bruising Neurologic: No focal weakness, trouble with speech or swollowing Urologic: No dysuria, Hematuria, or urinary frequency   Physical Exam:  ED Triage  Vitals [09/23/16 1839]  Enc Vitals Group     BP 110/67     Pulse Rate (!) 103     Resp 18     Temp 98.1 F (36.7 C)     Temp Source Oral     SpO2 98 %     Weight 145 lb (65.8 kg)     Height 6\' 1"  (1.854 m)     Head Circumference      Peak Flow      Pain Score 10     Pain Loc      Pain Edu?      Excl. in GC?     General: Awake , Alert , and Oriented times 3; GCS 15 Patient does have a resting tremor. Head: Normal cephalic , atraumatic Eyes: Pupils equal , round, reactive to light Nose/Throat: No nasal drainage, patent upper airway without erythema or exudate.  Neck: Supple, Full range of motion, No anterior adenopathy or palpable thyroid masses Lungs: Clear to ascultation without wheezes , rhonchi, or rales Heart: Regular, regular rhythm without murmurs , gallops , or rubs Abdomen: Soft, non tender without rebound, guarding , or rigidity; bowel sounds positive and symmetric in all 4 quadrants. No organomegaly .        Extremities: 2 plus symmetric pulses. No edema, clubbing or cyanosis Neurologic: normal ambulation, Motor symmetric without deficits, sensory intact Skin: warm, dry, no rashes Rectal exam with chaperone present shows some mild erythema and tenderness over the 3:00 region of his anus without any obvious abscess or fluctuant palpable.  Labs:   All laboratory work was reviewed including any pertinent negatives or positives listed below:  Labs Reviewed  COMPREHENSIVE METABOLIC PANEL - Abnormal; Notable for the following:       Result Value   Chloride 100 (*)    Glucose, Bld 101 (*)    Calcium 8.8 (*)    ALT 16 (*)    All other components within normal limits  CBC WITH DIFFERENTIAL/PLATELET - Abnormal; Notable for the following:    RBC 4.10 (*)    HCT 38.8 (*)    All other components within normal limits  ETHANOL    Radiology: * Abdominal pelvic CT with contrast is pending I personally reviewed the radiologic studies    ED Course:  Patient was given  some IV pain medication and is can undergo abdominal pelvic CT to assess for his rectal abscess. Clinical Course      Assessment:  Alcohol withdrawal Perirectal abscess      Plan: * IV fluids along with benzodiazepine therapy and abdominal pelvic CT which are pending            Jennye MoccasinBrian S Quigley, MD 09/23/16 2326

## 2016-09-23 NOTE — ED Triage Notes (Signed)
Pt reports an abscess around anus for 1 day.  Painful to sit.  Pt also states he has alcohol withdrawal.  No etoh since 0100 today.  Pt alert, calm and cooperative.

## 2016-09-24 ENCOUNTER — Encounter
Admission: EM | Disposition: A | Discharge status: Home / Self Care | Payer: Self-pay | Source: Home / Self Care | Attending: Emergency Medicine

## 2016-09-24 ENCOUNTER — Encounter: Payer: Self-pay | Admitting: Radiology

## 2016-09-24 ENCOUNTER — Emergency Department: Payer: Self-pay

## 2016-09-24 ENCOUNTER — Observation Stay: Payer: Self-pay | Admitting: Registered Nurse

## 2016-09-24 DIAGNOSIS — K611 Rectal abscess: Secondary | ICD-10-CM | POA: Diagnosis present

## 2016-09-24 HISTORY — PX: INCISION AND DRAINAGE PERIRECTAL ABSCESS: SHX1804

## 2016-09-24 LAB — TSH: TSH: 2.72 u[IU]/mL (ref 0.350–4.500)

## 2016-09-24 LAB — URINE DRUG SCREEN, QUALITATIVE (ARMC ONLY)
Amphetamines, Ur Screen: NOT DETECTED
Barbiturates, Ur Screen: NOT DETECTED
Benzodiazepine, Ur Scrn: POSITIVE — AB
Cannabinoid 50 Ng, Ur ~~LOC~~: NOT DETECTED
Cocaine Metabolite,Ur ~~LOC~~: NOT DETECTED
MDMA (Ecstasy)Ur Screen: NOT DETECTED
Methadone Scn, Ur: NOT DETECTED
Opiate, Ur Screen: POSITIVE — AB
Phencyclidine (PCP) Ur S: NOT DETECTED
Tricyclic, Ur Screen: NOT DETECTED

## 2016-09-24 IMAGING — CT CT ABD-PELV W/ CM
2 of 4 series · 14 of 46 positions shown, 16 images · IV contrast (APPLIED)
Comparison: None.

CLINICAL DATA: Acute onset of perianal pain, due to abscess.
Initial encounter.

EXAM:
CT ABDOMEN AND PELVIS WITH CONTRAST
TECHNIQUE: Multidetector CT imaging of the abdomen and pelvis was performed
using the standard protocol following bolus administration of
intravenous contrast.
CONTRAST:  100mL [SH] IOPAMIDOL ([SH]) INJECTION 61%

[Series 2: axial st · axial · 0.73mm/px · z∈[-1020,-570]mm · 11 of 110 slices shown, 13 images]
[im 10/110  soft-tissue]
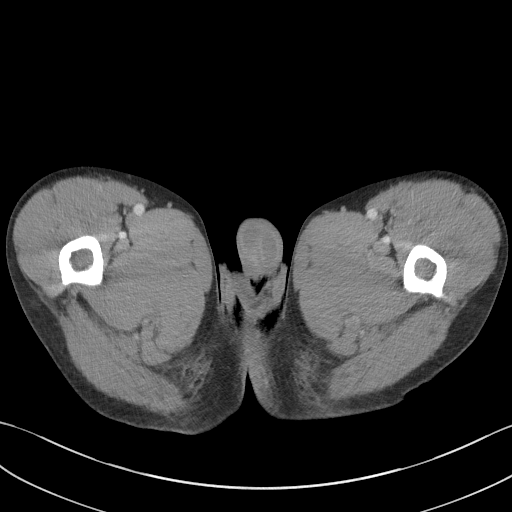
[im 10/110  bone]
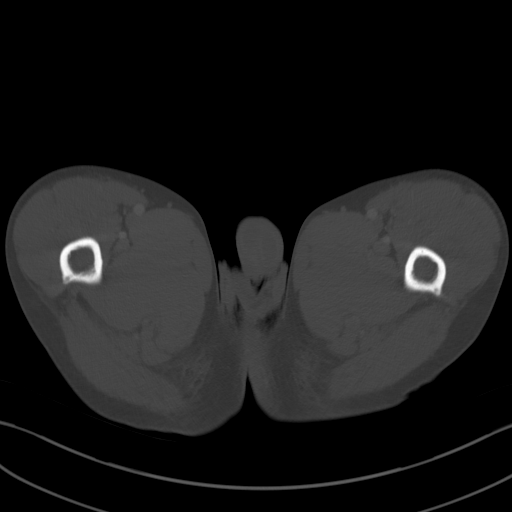
[im 19/110  soft-tissue]
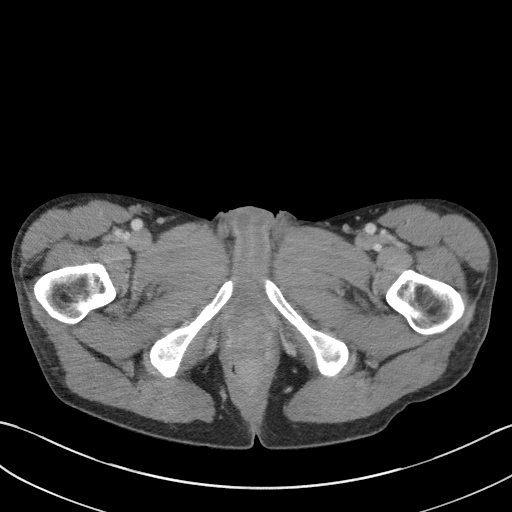
[im 28/110  soft-tissue]
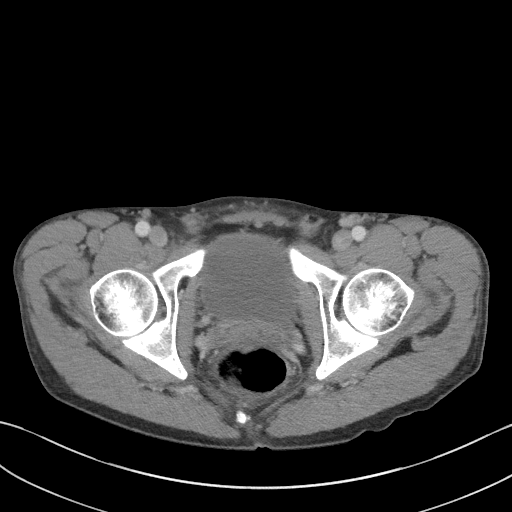
[im 37/110  soft-tissue]
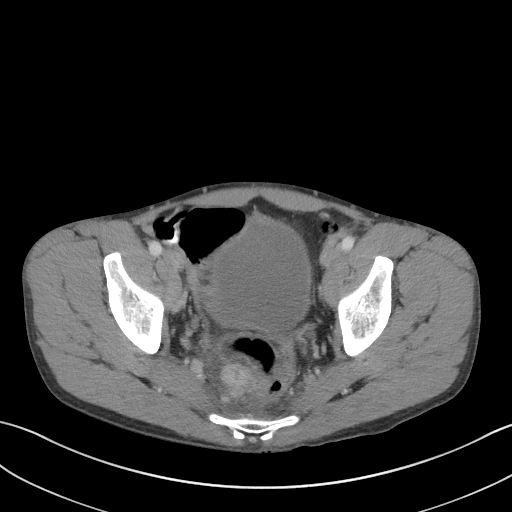
[im 46/110  soft-tissue]
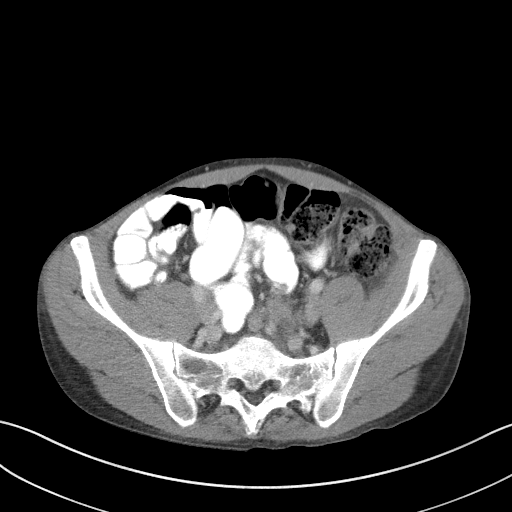
[im 55/110  soft-tissue]
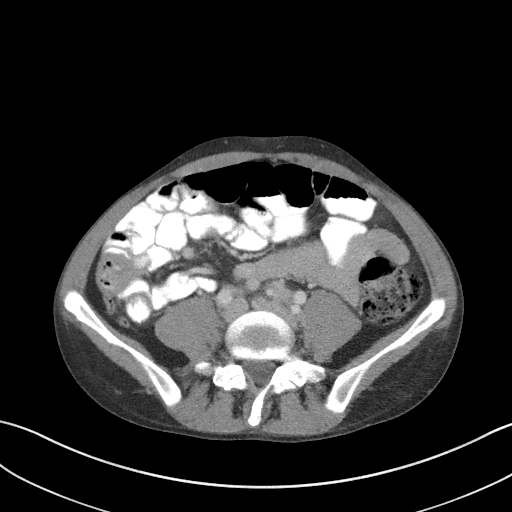
[im 64/110  soft-tissue]
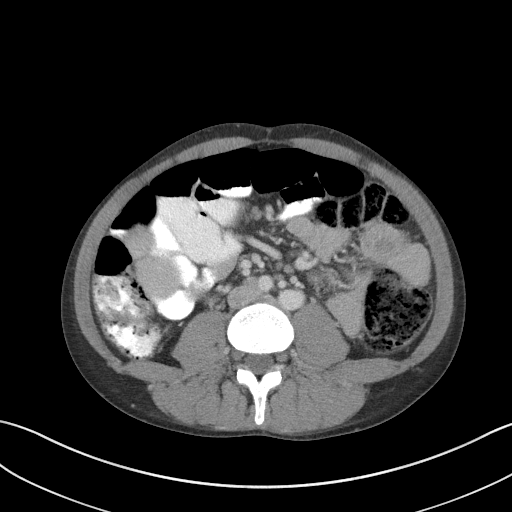
[im 73/110  soft-tissue]
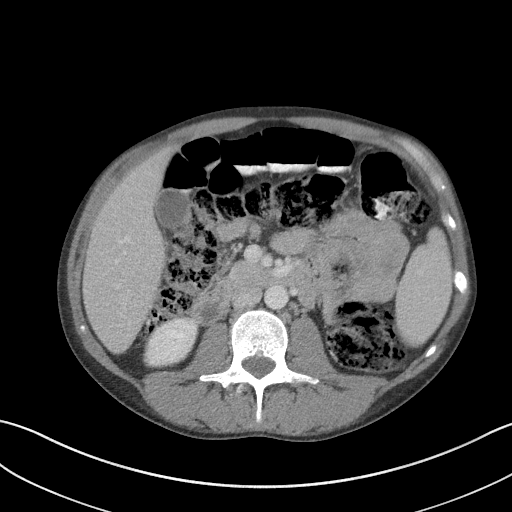
[im 82/110  soft-tissue]
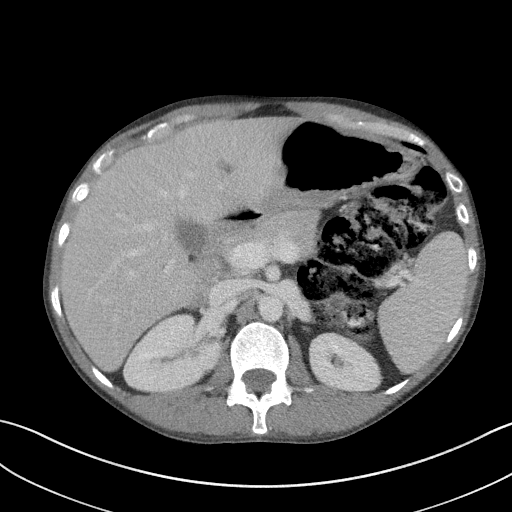
[im 82/110  bone]
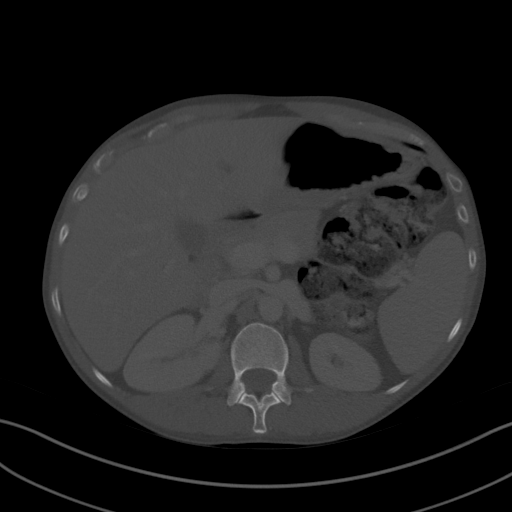
[im 91/110  soft-tissue]
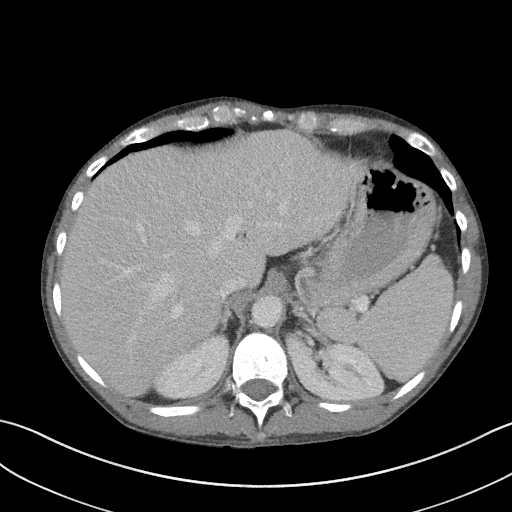
[im 100/110  soft-tissue]
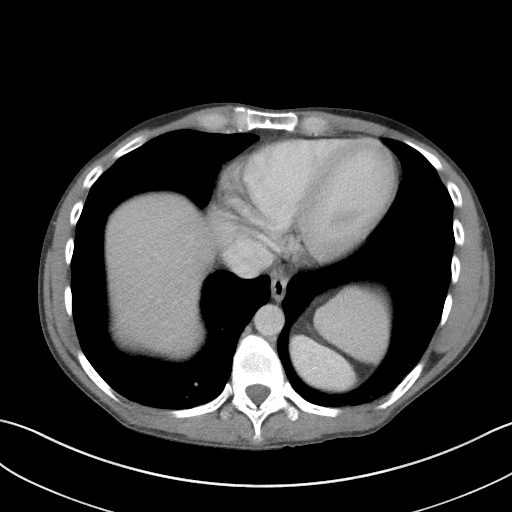

[Series 5: coronal st · coronal · 0.74mm/px · 3 of 88 slices shown]
[im 30/88  soft-tissue]
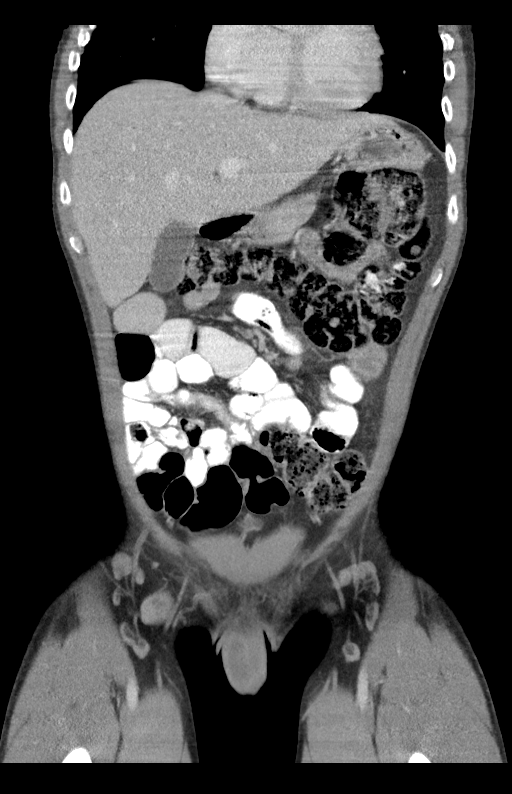
[im 39/88  soft-tissue]
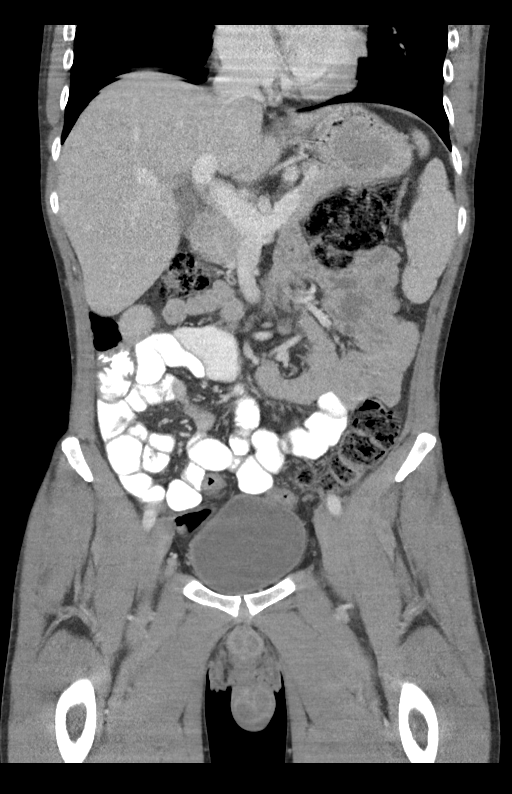
[im 49/88  soft-tissue]
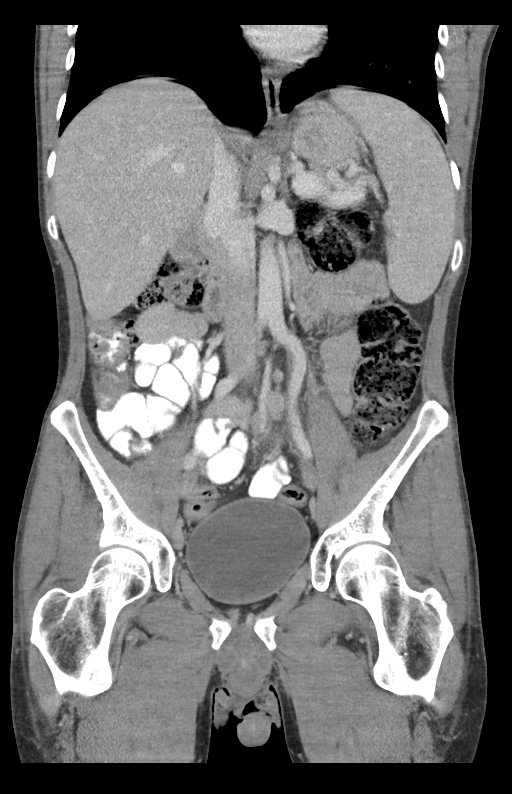

[14 of 46 positions shown; findings below may reference images not displayed]

FINDINGS: Lower chest: The visualized lung bases are grossly clear. The
visualized portions of the mediastinum are unremarkable.

Hepatobiliary: The liver is unremarkable in appearance. The
gallbladder is unremarkable in appearance. The common bile duct
remains normal in caliber.

Pancreas: The pancreas is within normal limits.

Spleen: The spleen is unremarkable in appearance.

Adrenals/Urinary Tract: The adrenal glands are unremarkable in
appearance. The kidneys are within normal limits. There is no
evidence of hydronephrosis. No renal or ureteral stones are
identified. No perinephric stranding is seen.

Stomach/Bowel: The stomach is unremarkable in appearance. The small
bowel is within normal limits. The appendix is normal in caliber,
without evidence of appendicitis. The colon is unremarkable in
appearance.

There is a mildly heterogeneous hyperattenuating mass to the right
of the distal sigmoid colon, with surrounding prominent vasculature
and soft tissue inflammation. This measures approximately 2.9 x
cm, and given the lymphadenopathy described below, likely reflects
an enlarged node. A few smaller adjacent nodes are seen.

Vascular/Lymphatic: The abdominal aorta is unremarkable in
appearance. Note is made of enlarged retroperitoneal nodes below the
level of the aortic bifurcation medial to the left iliac vessels,
measuring up to 3.2 cm in short axis, with slightly decreased
central attenuation. Associated soft tissue inflammation is seen
tracking into the pelvis.

Reproductive: The bladder is moderately distended and grossly
unremarkable. The prostate is borderline normal in size, with
surrounding prominent vasculature.

Other: Note is made of a focal 3.4 x 1.3 x 1.9 cm abscess along the
left side of the anorectal canal, with minimal fluid tracking about
the right side of the anorectal canal. There is vague prominence of
the soft tissues about the anorectal canal, which may reflect
chronic inflammation.

Musculoskeletal: No acute osseous abnormalities are identified. The
visualized musculature is unremarkable in appearance.
IMPRESSION: 1. 3.4 x 1.3 x 1.9 cm abscess along the left side of the anorectal
canal, with minimal fluid tracking about the right side of the
anorectal canal. Vague prominence of the soft tissues about the
anorectal canal, which may reflect chronic inflammation.
2. Multiple enlarged retroperitoneal nodes below the level of the
aortic bifurcation medial to the left iliac vessels, measuring up to
3.2 cm in short axis, with slightly decreased central attenuation,
raising concern for necrosis. This could reflect metastatic disease
or lymphoma. Given surrounding soft tissue inflammation, this
appears somewhat more aggressive than typically seen for
lymphoproliferative disorder.
3. Associated mildly heterogeneous hyperattenuating mass to the
right of the distal sigmoid colon, with surrounding prominent
vasculature and soft tissue inflammation, measuring 2.9 x 2.5 cm.
This likely also reflects an enlarged node. Few smaller adjacent
nodes seen. This could be amenable to percutaneous biopsy, as deemed
clinically appropriate.

## 2016-09-24 SURGERY — INCISION AND DRAINAGE, ABSCESS, PERIRECTAL
Anesthesia: General

## 2016-09-24 MED ORDER — ONDANSETRON HCL 4 MG PO TABS: 4.0000 mg | ORAL_TABLET | Freq: Four times a day (QID) | ORAL | Status: DC | PRN

## 2016-09-24 MED ORDER — HYDROCODONE-ACETAMINOPHEN 5-325 MG PO TABS
1.0000 | ORAL_TABLET | ORAL | Status: DC | PRN
  Administered 2016-09-24 – 2016-09-25 (×3): 1 via ORAL
  Filled 2016-09-24 (×3): qty 1

## 2016-09-24 MED ORDER — MIDAZOLAM HCL 2 MG/2ML IJ SOLN
INTRAMUSCULAR | Status: DC | PRN
  Administered 2016-09-24: 2 mg via INTRAVENOUS

## 2016-09-24 MED ORDER — ACETAMINOPHEN 325 MG PO TABS: 650.0000 mg | ORAL_TABLET | Freq: Four times a day (QID) | ORAL | Status: DC | PRN

## 2016-09-24 MED ORDER — ELVITEG-COBIC-EMTRICIT-TENOFAF 150-150-200-10 MG PO TABS
1.0000 | ORAL_TABLET | Freq: Every day | ORAL | Status: DC
  Administered 2016-09-24 – 2016-09-25 (×2): 1 via ORAL
  Filled 2016-09-24 (×2): qty 1

## 2016-09-24 MED ORDER — ONDANSETRON HCL 4 MG/2ML IJ SOLN
4.0000 mg | Freq: Four times a day (QID) | INTRAMUSCULAR | Status: DC | PRN
  Administered 2016-09-24: 4 mg via INTRAVENOUS

## 2016-09-24 MED ORDER — HYDROCORTISONE ACE-PRAMOXINE 1-1 % RE FOAM
1.0000 | Freq: Three times a day (TID) | RECTAL | Status: DC
  Administered 2016-09-24 – 2016-09-25 (×3): 1 via RECTAL
  Filled 2016-09-24 (×2): qty 10

## 2016-09-24 MED ORDER — BUPIVACAINE HCL (PF) 0.25 % IJ SOLN
INTRAMUSCULAR | Status: AC
  Filled 2016-09-24: qty 30

## 2016-09-24 MED ORDER — ENOXAPARIN SODIUM 40 MG/0.4ML ~~LOC~~ SOLN
40.0000 mg | SUBCUTANEOUS | Status: DC
  Filled 2016-09-24: qty 0.4

## 2016-09-24 MED ORDER — FENTANYL CITRATE (PF) 100 MCG/2ML IJ SOLN
INTRAMUSCULAR | Status: DC | PRN
  Administered 2016-09-24 (×4): 25 ug via INTRAVENOUS

## 2016-09-24 MED ORDER — POTASSIUM CHLORIDE IN NACL 20-0.9 MEQ/L-% IV SOLN
INTRAVENOUS | Status: DC
  Administered 2016-09-24: 11:00:00 via INTRAVENOUS
  Filled 2016-09-24 (×3): qty 1000

## 2016-09-24 MED ORDER — CHLORHEXIDINE GLUCONATE CLOTH 2 % EX PADS: 6.0000 | MEDICATED_PAD | Freq: Once | CUTANEOUS | Status: DC

## 2016-09-24 MED ORDER — PIPERACILLIN-TAZOBACTAM 3.375 G IVPB
3.3750 g | Freq: Three times a day (TID) | INTRAVENOUS | Status: DC
  Administered 2016-09-24 – 2016-09-25 (×3): 3.375 g via INTRAVENOUS
  Filled 2016-09-24 (×3): qty 50

## 2016-09-24 MED ORDER — PIPERACILLIN-TAZOBACTAM 3.375 G IVPB 30 MIN
3.3750 g | Freq: Once | INTRAVENOUS | Status: AC
  Administered 2016-09-24: 3.375 g via INTRAVENOUS
  Filled 2016-09-24: qty 50

## 2016-09-24 MED ORDER — LIDOCAINE HCL (CARDIAC) 20 MG/ML IV SOLN
INTRAVENOUS | Status: DC | PRN
  Administered 2016-09-24: 60 mg via INTRAVENOUS

## 2016-09-24 MED ORDER — PROPOFOL 10 MG/ML IV BOLUS
INTRAVENOUS | Status: DC | PRN
  Administered 2016-09-24: 200 mg via INTRAVENOUS
  Administered 2016-09-24: 20 mg via INTRAVENOUS

## 2016-09-24 MED ORDER — LACTATED RINGERS IV SOLN
INTRAVENOUS | Status: DC
  Administered 2016-09-24: 09:00:00 via INTRAVENOUS

## 2016-09-24 MED ORDER — IOPAMIDOL (ISOVUE-300) INJECTION 61%
100.0000 mL | Freq: Once | INTRAVENOUS | Status: AC | PRN
  Administered 2016-09-24: 100 mL via INTRAVENOUS

## 2016-09-24 MED ORDER — MORPHINE SULFATE (PF) 2 MG/ML IV SOLN
2.0000 mg | INTRAVENOUS | Status: DC | PRN
Start: 2016-09-24 — End: 2016-09-25
  Administered 2016-09-24 – 2016-09-25 (×4): 2 mg via INTRAVENOUS
  Filled 2016-09-24 (×4): qty 1

## 2016-09-24 MED ORDER — FENTANYL CITRATE (PF) 100 MCG/2ML IJ SOLN: 25.0000 ug | INTRAMUSCULAR | Status: DC | PRN

## 2016-09-24 MED ORDER — EPHEDRINE SULFATE 50 MG/ML IJ SOLN
INTRAMUSCULAR | Status: DC | PRN
  Administered 2016-09-24: 5 mg via INTRAVENOUS

## 2016-09-24 MED ORDER — LIDOCAINE HCL 1 % IJ SOLN
INTRAMUSCULAR | Status: DC | PRN
  Administered 2016-09-24: 10 mL

## 2016-09-24 MED ORDER — KETOROLAC TROMETHAMINE 30 MG/ML IJ SOLN
INTRAMUSCULAR | Status: DC | PRN
  Administered 2016-09-24: 30 mg via INTRAVENOUS

## 2016-09-24 MED ORDER — PRAMOXINE-HC 1-2.5 % EX CREA: TOPICAL_CREAM | Freq: Three times a day (TID) | CUTANEOUS | Status: DC

## 2016-09-24 MED ORDER — ONDANSETRON HCL 4 MG/2ML IJ SOLN: 4.0000 mg | Freq: Once | INTRAMUSCULAR | Status: DC | PRN

## 2016-09-24 MED ORDER — DOCUSATE SODIUM 100 MG PO CAPS
100.0000 mg | ORAL_CAPSULE | Freq: Two times a day (BID) | ORAL | Status: DC
  Administered 2016-09-24 – 2016-09-25 (×3): 100 mg via ORAL
  Filled 2016-09-24 (×3): qty 1

## 2016-09-24 MED ORDER — LIDOCAINE HCL (PF) 1 % IJ SOLN
INTRAMUSCULAR | Status: AC
  Filled 2016-09-24: qty 30

## 2016-09-24 MED ORDER — PHENYLEPHRINE HCL 10 MG/ML IJ SOLN
INTRAMUSCULAR | Status: DC | PRN
  Administered 2016-09-24: 100 ug via INTRAVENOUS
  Administered 2016-09-24: 50 ug via INTRAVENOUS

## 2016-09-24 SURGICAL SUPPLY — 24 items
BLADE SURG SZ11 CARB STEEL (BLADE) ×2 IMPLANT
BRIEF STRETCH MATERNITY 2XLG (MISCELLANEOUS) ×2 IMPLANT
CANISTER SUCT 1200ML W/VALVE (MISCELLANEOUS) ×2 IMPLANT
DRAIN PENROSE 1/4X12 LTX (DRAIN) IMPLANT
DRAPE LEGGINS SURG 28X43 STRL (DRAPES) ×2 IMPLANT
DRAPE UNDER BUTTOCK W/FLU (DRAPES) ×2 IMPLANT
ELECT REM PT RETURN 9FT ADLT (ELECTROSURGICAL) ×2
ELECTRODE REM PT RTRN 9FT ADLT (ELECTROSURGICAL) ×1 IMPLANT
GAUZE PACKING 1/4X5YD (GAUZE/BANDAGES/DRESSINGS) ×2 IMPLANT
GAUZE SPONGE 4X4 12PLY STRL (GAUZE/BANDAGES/DRESSINGS) ×2 IMPLANT
GLOVE BIO SURGEON STRL SZ7.5 (GLOVE) ×2 IMPLANT
GLOVE INDICATOR 8.0 STRL GRN (GLOVE) ×2 IMPLANT
GOWN STRL REUS W/ TWL LRG LVL3 (GOWN DISPOSABLE) ×2 IMPLANT
GOWN STRL REUS W/TWL LRG LVL3 (GOWN DISPOSABLE) ×2
KIT RM TURNOVER STRD PROC AR (KITS) ×2 IMPLANT
NDL SAFETY 18GX1.5 (NEEDLE) ×2 IMPLANT
NS IRRIG 500ML POUR BTL (IV SOLUTION) ×2 IMPLANT
PACK BASIN MINOR ARMC (MISCELLANEOUS) ×2 IMPLANT
PAD ABD DERMACEA PRESS 5X9 (GAUZE/BANDAGES/DRESSINGS) ×2 IMPLANT
SCRUB POVIDONE IODINE 4 OZ (MISCELLANEOUS) ×2 IMPLANT
SURGILUBE 2OZ TUBE FLIPTOP (MISCELLANEOUS) ×2 IMPLANT
SUT ETHILON 3-0 (SUTURE) IMPLANT
SWAB CULTURE AMIES ANAERIB BLU (MISCELLANEOUS) ×2 IMPLANT
SYRINGE 10CC LL (SYRINGE) ×2 IMPLANT

## 2016-09-24 NOTE — ED Notes (Signed)
Pt finished contrast. Ct called.

## 2016-09-24 NOTE — Progress Notes (Signed)
Dr Tonita CongWoodham has requested that IVF be discontinued

## 2016-09-24 NOTE — Anesthesia Preprocedure Evaluation (Signed)
Anesthesia Evaluation  Patient identified by MRN, date of birth, ID band Patient awake    Reviewed: Allergy & Precautions, H&P , NPO status , Patient's Chart, lab work & pertinent test results, reviewed documented beta blocker date and time   Airway Mallampati: II  TM Distance: >3 FB Neck ROM: full    Dental  (+) Teeth Intact   Pulmonary neg pulmonary ROS, Current Smoker,    Pulmonary exam normal        Cardiovascular Exercise Tolerance: Good negative cardio ROS Normal cardiovascular exam Rate:Normal     Neuro/Psych negative neurological ROS  negative psych ROS   GI/Hepatic negative GI ROS, Neg liver ROS,   Endo/Other  negative endocrine ROS  Renal/GU negative Renal ROS  negative genitourinary   Musculoskeletal   Abdominal   Peds  Hematology negative hematology ROS (+)   Anesthesia Other Findings   Reproductive/Obstetrics negative OB ROS                             Anesthesia Physical Anesthesia Plan  ASA: III  Anesthesia Plan: General LMA   Post-op Pain Management:    Induction:   Airway Management Planned:   Additional Equipment:   Intra-op Plan:   Post-operative Plan:   Informed Consent: I have reviewed the patients History and Physical, chart, labs and discussed the procedure including the risks, benefits and alternatives for the proposed anesthesia with the patient or authorized representative who has indicated his/her understanding and acceptance.     Plan Discussed with: CRNA  Anesthesia Plan Comments:         Anesthesia Quick Evaluation  

## 2016-09-24 NOTE — Progress Notes (Signed)
New Admission Note:   Arrival Method: per stretcher from ED, pt came from home Mental Orientation: alert and oriented X4 Telemetry: none ordered Assessment: Completed Skin: warm, dry, no wounds noted, with redness noted on the perianal area. Pt admitted due to rectal abscess. IV: G20 on the left and right forearm with transparent dressing, both intact Pain: 6/10 scale, burning and sharp pain on the rectum. Will administer PRN pain medicine Safety Measures: Safety Fall Prevention Plan has been given and discussed Admission: Completed 1A Orientation: Patient has been orientated to the room, unit and staff.  Family: no family member at bedside as of this time  Orders have been reviewed and implemented. Will continue to monitor patient. Call light has been placed within reach.  Janice NorrieAnessa Macrohon BSN, RN [facility] 1A

## 2016-09-24 NOTE — Progress Notes (Signed)
[facility] [place] [facility]   PATIENT NAME: Gary [name]    MR#:  [id number]  DATE OF BIRTH:  [birth year]  SUBJECTIVE:  CHIEF COMPLAINT:   Chief Complaint  Patient presents with  . Abscess  . Withdrawal   The patient is 30 year old Caucasian male with past medical history significant history of HIV, who presents to the hospital with complaints of buttock area pain, swelling, mostly in the left side of buttock cheek. Patient was admitted to the hospital for further evaluation, seen by surgeon and recommended incision and drainage in the operating room, continue antibiotic therapy as outpatient.    Review of Systems  Constitutional: Negative for chills, fever and weight loss.  HENT: Negative for congestion.   Eyes: Negative for blurred vision and double vision.  Respiratory: Negative for cough, sputum production, shortness of breath and wheezing.   Cardiovascular: Negative for chest pain, palpitations, orthopnea, leg swelling and PND.  Gastrointestinal: Negative for abdominal pain, blood in stool, constipation, diarrhea, nausea and vomiting.  Genitourinary: Negative for dysuria, frequency, hematuria and urgency.  Musculoskeletal: Negative for falls.  Neurological: Negative for dizziness, tremors, focal weakness and headaches.  Endo/Heme/Allergies: Does not bruise/bleed easily.  Psychiatric/Behavioral: Negative for depression. The patient does not have insomnia.    left buttock cheek pain and swelling  VITAL SIGNS: Blood pressure 95/61, pulse 88, temperature 98.1 F (36.7 C), temperature source Oral, resp. rate 18, height 6\' 1"  (1.854 m), weight 67.3 kg (148 lb 4.8 oz), SpO2 100 %.  PHYSICAL EXAMINATION:   GENERAL:  30 y.o.-year-old patient lying in the bed with no acute distress.  EYES: Pupils equal, round, reactive to light and accommodation. No scleral icterus. Extraocular muscles intact.  HEENT: Head atraumatic, normocephalic. Oropharynx and  nasopharynx clear.  NECK:  Supple, no jugular venous distention. No thyroid enlargement, no tenderness.  LUNGS: Normal breath sounds bilaterally, no wheezing, rales,rhonchi or crepitation. No use of accessory muscles of respiration.  CARDIOVASCULAR: S1, S2 normal. No murmurs, rubs, or gallops.  ABDOMEN: Soft, nontender, nondistended. Bowel sounds present. No organomegaly or mass.  EXTREMITIES: No pedal edema, cyanosis, or clubbing. Left buttock cleft area is so severely swollen, very tender to palpation but no fluctuations were noted. No purulent discharge NEUROLOGIC: Cranial nerves II through XII are intact. Muscle strength 5/5 in all extremities. Sensation intact. Gait not checked.  PSYCHIATRIC: The patient is alert and oriented x 3.  SKIN: No obvious rash, lesion, or ulcer.   ORDERS/RESULTS REVIEWED:   CBC  Recent Labs Lab 09/23/16 2146  WBC 7.4  HGB 13.5  HCT 38.8*  PLT 221  MCV 94.6  MCH 33.0  MCHC 34.9  RDW 13.4  LYMPHSABS 1.2  MONOABS 0.8  EOSABS 0.2  BASOSABS 0.0   ------------------------------------------------------------------------------------------------------------------  Chemistries   Recent Labs Lab 09/23/16 2146  NA 135  K 3.9  CL 100*  CO2 27  GLUCOSE 101*  BUN 8  CREATININE 0.75  CALCIUM 8.8*  AST 15  ALT 16*  ALKPHOS 65  BILITOT 0.5   ------------------------------------------------------------------------------------------------------------------ estimated creatinine clearance is 128.5 mL/min (by C-G formula based on SCr of 0.75 mg/dL). ------------------------------------------------------------------------------------------------------------------  Recent Labs  09/24/16 0454  TSH 2.720    Cardiac Enzymes No results for input(s): CKMB, TROPONINI, MYOGLOBIN in the last 168 hours.  Invalid input(s): CK ------------------------------------------------------------------------------------------------------------------ Invalid  input(s): POCBNP ---------------------------------------------------------------------------------------------------------------  RADIOLOGY: Ct Abdomen Pelvis W Contrast  Result Date: 09/24/2016 CLINICAL DATA:  Acute onset of perianal pain, due to abscess. Initial encounter. EXAM:  CT ABDOMEN AND PELVIS WITH CONTRAST TECHNIQUE: Multidetector CT imaging of the abdomen and pelvis was performed using the standard protocol following bolus administration of intravenous contrast. CONTRAST:  100mL ISOVUE-300 IOPAMIDOL (ISOVUE-300) INJECTION 61% COMPARISON:  None. FINDINGS: Lower chest: The visualized lung bases are grossly clear. The visualized portions of the mediastinum are unremarkable. Hepatobiliary: The liver is unremarkable in appearance. The gallbladder is unremarkable in appearance. The common bile duct remains normal in caliber. Pancreas: The pancreas is within normal limits. Spleen: The spleen is unremarkable in appearance. Adrenals/Urinary Tract: The adrenal glands are unremarkable in appearance. The kidneys are within normal limits. There is no evidence of hydronephrosis. No renal or ureteral stones are identified. No perinephric stranding is seen. Stomach/Bowel: The stomach is unremarkable in appearance. The small bowel is within normal limits. The appendix is normal in caliber, without evidence of appendicitis. The colon is unremarkable in appearance. There is a mildly heterogeneous hyperattenuating mass to the right of the distal sigmoid colon, with surrounding prominent vasculature and soft tissue inflammation. This measures approximately 2.9 x 2.5 cm, and given the lymphadenopathy described below, likely reflects an enlarged node. A few smaller adjacent nodes are seen. Vascular/Lymphatic: The abdominal aorta is unremarkable in appearance. Note is made of enlarged retroperitoneal nodes below the level of the aortic bifurcation medial to the left iliac vessels, measuring up to 3.2 cm in short axis, with  slightly decreased central attenuation. Associated soft tissue inflammation is seen tracking into the pelvis. Reproductive: The bladder is moderately distended and grossly unremarkable. The prostate is borderline normal in size, with surrounding prominent vasculature. Other: Note is made of a focal 3.4 x 1.3 x 1.9 cm abscess along the left side of the anorectal canal, with minimal fluid tracking about the right side of the anorectal canal. There is vague prominence of the soft tissues about the anorectal canal, which may reflect chronic inflammation. Musculoskeletal: No acute osseous abnormalities are identified. The visualized musculature is unremarkable in appearance. IMPRESSION: 1. 3.4 x 1.3 x 1.9 cm abscess along the left side of the anorectal canal, with minimal fluid tracking about the right side of the anorectal canal. Vague prominence of the soft tissues about the anorectal canal, which may reflect chronic inflammation. 2. Multiple enlarged retroperitoneal nodes below the level of the aortic bifurcation medial to the left iliac vessels, measuring up to 3.2 cm in short axis, with slightly decreased central attenuation, raising concern for necrosis. This could reflect metastatic disease or lymphoma. Given surrounding soft tissue inflammation, this appears somewhat more aggressive than typically seen for lymphoproliferative disorder. 3. Associated mildly heterogeneous hyperattenuating mass to the right of the distal sigmoid colon, with surrounding prominent vasculature and soft tissue inflammation, measuring 2.9 x 2.5 cm. This likely also reflects an enlarged node. Few smaller adjacent nodes seen. This could be amenable to percutaneous biopsy, as deemed clinically appropriate. Electronically Signed   By: Roanna RaiderJeffery  Chang M.D.   On: 09/24/2016 02:03    EKG:  Orders placed or performed during the hospital encounter of 08/13/16  . ED EKG within 10 minutes  . ED EKG within 10 minutes  . EKG 12-Lead  . EKG  12-Lead  . EKG    ASSESSMENT AND PLAN:  Active Problems:   Perirectal abscess  #1. Perirectal abscess, patient will be going to operating therapy for incision and drainage by Dr. Tonita CongWoodham  today, likely discharge home after procedure by surgery, follow-up with surgery as outpatient, follow-up with primary care physician, Dr. Sampson GoonFitzgerald #2 retroperitoneal  lymphadenopathy of unclear etiology at this time, patient will need to have outpatient studies done after infection is  Treated, patient was advised to follow-up with Dr. Sampson GoonFitzgerald for recommendations #3. Hyperglycemia, get hemoglobin A1c to rule out diabetes #4. HIV, well controlled, continue follow-up with Dr. Sampson GoonFitzgerald as previously scheduled   Management plans discussed with the patient, family and they are in agreement.   DRUG ALLERGIES: No Known Allergies  CODE STATUS:     Code Status Orders        Start     Ordered   09/24/16 0430  Full code  Continuous     09/24/16 0429    Code Status History    Date Active Date Inactive Code Status Order ID Comments User Context   This patient has a current code status but no historical code status.      TOTAL TIME TAKING CARE OF THIS PATIENT: 40 minutes.    Katharina CaperVAICKUTE,RIMA M.D on 09/24/2016 at 2:35 PM  Between 7am to 6pm - Pager - [phone]  After 6pm go to [email or web address] - password [email or web address] [facility]  [facility]  Office  [phone]  CC: Primary care physician; Mick SellFITZGERALD, DAVID P, MD

## 2016-09-24 NOTE — Op Note (Signed)
   Pre-operative Diagnosis: Perirectal abscess  Post-operative Diagnosis: Same  Procedure performed: Incision and drainage of perirectal abscess  Surgeon: Ricarda Frameharles Woodham   Assistants: None  Anesthesia: General LMA anesthesia  ASA Class: 2  Surgeon: Ricarda Frameharles Woodham, MD FACS  Anesthesia: Gen. with endotracheal tube  Assistant: None  Procedure Details  The patient was seen again in the Holding Room. The benefits, complications, treatment options, and expected outcomes were discussed with the patient. The risks of bleeding, infection, recurrence of symptoms, failure to resolve symptoms,  bowel injury, any of which could require further surgery were reviewed with the patient.   The patient was taken to Operating Room, identified as Gary [name] and the procedure verified.  A Time Out was held and the above information confirmed.  Prior to the induction of general anesthesia, antibiotic prophylaxis was confirmed. VTE prophylaxis was in place. General anesthesia with LMA was then administered and tolerated well. After the induction, the perirectal space was prepped with Betadine and draped in the sterile fashion. The patient was positioned in the high lithotomy position.  The procedure began with a digital rectal exam which confirmed the left-sided perirectal abscess. He was first accessed with a 18-gauge needle and pus was returned. An incision was then made with an 11 blade scalpel and a large quantity of purulent material immediately was expelled. This was cultured and then copiously irrigated with normal saline. It was irrigated until the irrigation returned clear. The entire area was palpated and the decision made to pack this with quarter inch plain packing. Once the packing was in place and cut to the appropriate size the area was cleaned off and then covered with plain gauze and ABDs pad. The patient was returned to the supine position where mesh underwear was put in place to hold the  dressing.  The patient tolerated procedure well. There were no immediate, complications. All counts were correct at the end of the procedure. He was transferred to the PACU in good condition.  Findings: Left-sided perirectal abscess   Estimated Blood Loss: 10 mL         Drains: None         Specimens: Culture of perirectal abscess          Complications: None                  Condition: Good   Ricarda Frameharles Woodham, MD, FACS

## 2016-09-24 NOTE — H&P (Signed)
Gary [name] is an 30 y.o. male.   Chief Complaint: Rectal pain HPI: The patient with past medical history of HIV presents to the emergency department complaining of rectal pain. He has a known abscess that he states feels as if it is getting worse. He has been putting preparation-H on the lesion with little relief. The surgery service declined to operate at this time but suggested IV antibiotics which prompted emergency department staff to call the hospitalist service for admission.  Past Medical History:  Diagnosis Date  . HIV (human immunodeficiency virus infection) ([place])     Past Surgical History:  Procedure Laterality Date  . none      No family history on file. none Social History:  reports that he has been smoking Cigarettes.  He has been smoking about 0.50 packs per day. He has never used smokeless tobacco. He reports that he drinks alcohol. He reports that he uses drugs, including Marijuana.  Allergies: No Known Allergies  Medications Prior to Admission  Medication Sig Dispense Refill  . chlordiazePOXIDE (LIBRIUM) 10 MG capsule Day 1-2 Take 1 tablet PO TID Day 3-4 Take 1 tablet PO BID Day 5-6 Take 1 tablet PO QD Day 7 STOP 12 capsule 0  . GENVOYA 150-150-200-10 MG TABS tablet Take 1 tablet by mouth daily.  11  . pramoxine-hydrocortisone cream Apply topically 3 (three) times daily. 28.35 g 0    Results for orders placed or performed during the hospital encounter of 09/23/16 (from the past 48 hour(s))  Comprehensive metabolic panel     Status: Abnormal   Collection Time: 09/23/16  9:46 PM  Result Value Ref Range   Sodium 135 135 - 145 mmol/L   Potassium 3.9 3.5 - 5.1 mmol/L   Chloride 100 (L) 101 - 111 mmol/L   CO2 27 22 - 32 mmol/L   Glucose, Bld 101 (H) 65 - 99 mg/dL   BUN 8 6 - 20 mg/dL   Creatinine, Ser 0.75 0.61 - 1.24 mg/dL   Calcium 8.8 (L) 8.9 - 10.3 mg/dL   Total Protein 8.1 6.5 - 8.1 g/dL   Albumin 3.8 3.5 - 5.0 g/dL   AST 15 15 - 41 U/L   ALT 16 (L) 17  - 63 U/L   Alkaline Phosphatase 65 38 - 126 U/L   Total Bilirubin 0.5 0.3 - 1.2 mg/dL   GFR calc non Af Amer >60 >60 mL/min   GFR calc Af Amer >60 >60 mL/min    Comment: (NOTE) The eGFR has been calculated using the CKD EPI equation. This calculation has not been validated in all clinical situations. eGFR's persistently <60 mL/min signify possible Chronic Kidney Disease.    Anion gap 8 5 - 15  CBC with Differential/Platelet     Status: Abnormal   Collection Time: 09/23/16  9:46 PM  Result Value Ref Range   WBC 7.4 3.8 - 10.6 K/uL   RBC 4.10 (L) 4.40 - 5.90 MIL/uL   Hemoglobin 13.5 13.0 - 18.0 g/dL   HCT 38.8 (L) 40.0 - 52.0 %   MCV 94.6 80.0 - 100.0 fL   MCH 33.0 26.0 - 34.0 pg   MCHC 34.9 32.0 - 36.0 g/dL   RDW 13.4 11.5 - 14.5 %   Platelets 221 150 - 440 K/uL   Neutrophils Relative % 70 %   Neutro Abs 5.2 1.4 - 6.5 K/uL   Lymphocytes Relative 17 %   Lymphs Abs 1.2 1.0 - 3.6 K/uL   Monocytes Relative 11 %  Monocytes Absolute 0.8 0.2 - 1.0 K/uL   Eosinophils Relative 2 %   Eosinophils Absolute 0.2 0 - 0.7 K/uL   Basophils Relative 0 %   Basophils Absolute 0.0 0 - 0.1 K/uL  Ethanol     Status: None   Collection Time: 09/23/16  9:46 PM  Result Value Ref Range   Alcohol, Ethyl (B) <5 <5 mg/dL    Comment:        LOWEST DETECTABLE LIMIT FOR SERUM ALCOHOL IS 5 mg/dL FOR MEDICAL PURPOSES ONLY   TSH     Status: None   Collection Time: 09/24/16  4:54 AM  Result Value Ref Range   TSH 2.720 0.350 - 4.500 uIU/mL    Comment: Performed by a 3rd Generation assay with a functional sensitivity of <=0.01 uIU/mL.   Ct Abdomen Pelvis W Contrast  Result Date: 09/24/2016 CLINICAL DATA:  Acute onset of perianal pain, due to abscess. Initial encounter. EXAM: CT ABDOMEN AND PELVIS WITH CONTRAST TECHNIQUE: Multidetector CT imaging of the abdomen and pelvis was performed using the standard protocol following bolus administration of intravenous contrast. CONTRAST:  14m ISOVUE-300 IOPAMIDOL  (ISOVUE-300) INJECTION 61% COMPARISON:  None. FINDINGS: Lower chest: The visualized lung bases are grossly clear. The visualized portions of the mediastinum are unremarkable. Hepatobiliary: The liver is unremarkable in appearance. The gallbladder is unremarkable in appearance. The common bile duct remains normal in caliber. Pancreas: The pancreas is within normal limits. Spleen: The spleen is unremarkable in appearance. Adrenals/Urinary Tract: The adrenal glands are unremarkable in appearance. The kidneys are within normal limits. There is no evidence of hydronephrosis. No renal or ureteral stones are identified. No perinephric stranding is seen. Stomach/Bowel: The stomach is unremarkable in appearance. The small bowel is within normal limits. The appendix is normal in caliber, without evidence of appendicitis. The colon is unremarkable in appearance. There is a mildly heterogeneous hyperattenuating mass to the right of the distal sigmoid colon, with surrounding prominent vasculature and soft tissue inflammation. This measures approximately 2.9 x 2.5 cm, and given the lymphadenopathy described below, likely reflects an enlarged node. A few smaller adjacent nodes are seen. Vascular/Lymphatic: The abdominal aorta is unremarkable in appearance. Note is made of enlarged retroperitoneal nodes below the level of the aortic bifurcation medial to the left iliac vessels, measuring up to 3.2 cm in short axis, with slightly decreased central attenuation. Associated soft tissue inflammation is seen tracking into the pelvis. Reproductive: The bladder is moderately distended and grossly unremarkable. The prostate is borderline normal in size, with surrounding prominent vasculature. Other: Note is made of a focal 3.4 x 1.3 x 1.9 cm abscess along the left side of the anorectal canal, with minimal fluid tracking about the right side of the anorectal canal. There is vague prominence of the soft tissues about the anorectal canal,  which may reflect chronic inflammation. Musculoskeletal: No acute osseous abnormalities are identified. The visualized musculature is unremarkable in appearance. IMPRESSION: 1. 3.4 x 1.3 x 1.9 cm abscess along the left side of the anorectal canal, with minimal fluid tracking about the right side of the anorectal canal. Vague prominence of the soft tissues about the anorectal canal, which may reflect chronic inflammation. 2. Multiple enlarged retroperitoneal nodes below the level of the aortic bifurcation medial to the left iliac vessels, measuring up to 3.2 cm in short axis, with slightly decreased central attenuation, raising concern for necrosis. This could reflect metastatic disease or lymphoma. Given surrounding soft tissue inflammation, this appears somewhat more aggressive than typically  seen for lymphoproliferative disorder. 3. Associated mildly heterogeneous hyperattenuating mass to the right of the distal sigmoid colon, with surrounding prominent vasculature and soft tissue inflammation, measuring 2.9 x 2.5 cm. This likely also reflects an enlarged node. Few smaller adjacent nodes seen. This could be amenable to percutaneous biopsy, as deemed clinically appropriate. Electronically Signed   By: Garald Balding M.D.   On: 09/24/2016 02:03    Review of Systems  Constitutional: Negative for chills and fever.  HENT: Negative for sore throat and tinnitus.   Eyes: Negative for blurred vision and redness.  Respiratory: Negative for cough and shortness of breath.   Cardiovascular: Negative for chest pain, palpitations, orthopnea and PND.  Gastrointestinal: Negative for abdominal pain, diarrhea, nausea and vomiting.  Genitourinary: Negative for dysuria, frequency and urgency.  Musculoskeletal: Negative for joint pain and myalgias.  Skin: Negative for rash.       No lesions  Neurological: Negative for speech change, focal weakness and weakness.  Endo/Heme/Allergies: Does not bruise/bleed easily.        No temperature intolerance  Psychiatric/Behavioral: Negative for depression and suicidal ideas.    Blood pressure 110/72, pulse 78, temperature 98.7 F (37.1 C), temperature source Oral, resp. rate 18, height _0  (1.854 m), weight 67.3 kg (148 lb 4.8 oz), SpO2 100 %. Physical Exam  Constitutional: He is oriented to person, place, and time. He appears well-developed and well-nourished. No distress.  HENT:  Head: Normocephalic and atraumatic.  Mouth/Throat: Oropharynx is clear and moist.  Eyes: Conjunctivae and EOM are normal. Pupils are equal, round, and reactive to light. No scleral icterus.  Neck: Normal range of motion. Neck supple. No JVD present. No tracheal deviation present. No thyromegaly present.  Cardiovascular: Normal rate, regular rhythm and normal heart sounds.  Exam reveals no gallop and no friction rub.   No murmur heard. Respiratory: Effort normal and breath sounds normal. No respiratory distress.  GI: Soft. Bowel sounds are normal. He exhibits no distension. There is no tenderness.  Genitourinary:  Genitourinary Comments: Deferred  Musculoskeletal: Normal range of motion. He exhibits no edema.  Lymphadenopathy:    He has no cervical adenopathy.  Neurological: He is alert and oriented to person, place, and time. No cranial nerve deficit.  Skin: Skin is warm and dry. No rash noted. No erythema.  Psychiatric: He has a normal mood and affect. His behavior is normal. Judgment and thought content normal.     Assessment/Plan This is a 30 year old male admitted for perirectal abscess. 1. Perirectal abscess: No signs or symptoms of sepsis. The patient has pain which needs to be controlled. Continue Zosyn. Surgery consulted for further guidance. 2. HIV: Patient reports last CD4 count approximate 420. Recheck help her T cells. Continue HAART 3. DVT prophylaxis: Lovenox 4. GI prophylaxis: None The patient is a full code. Time spent on admission orders and patient care  approximate 45 minutes  Harrie Foreman, MD 09/24/2016, 7:37 AM

## 2016-09-24 NOTE — Brief Op Note (Signed)
09/23/2016 - 09/24/2016  10:13 AM  PATIENT:  Gary [name]  30 y.o. male  PRE-OPERATIVE DIAGNOSIS:  perirectal abscess  POST-OPERATIVE DIAGNOSIS:  perirectal abscess  PROCEDURE:  Procedure(s): IRRIGATION AND DEBRIDEMENT PERIRECTAL ABSCESS (N/A)  SURGEON:  Surgeon(s) and Role:    * Ricarda Frameharles Woodham, MD - Primary  PHYSICIAN ASSISTANT:   ASSISTANTS: none   ANESTHESIA:   general  EBL:  Total I/O In: 500 [I.V.:500] Out: 10 [Blood:10]  BLOOD ADMINISTERED:none  DRAINS: none   LOCAL MEDICATIONS USED:  LIDOCAINE   SPECIMEN:  Source of Specimen:  abscess fluid for culture  DISPOSITION OF SPECIMEN:  micro  COUNTS:  YES  TOURNIQUET:  * No tourniquets in log *  DICTATION: .Dragon Dictation  PLAN OF CARE: return to inpatient  PATIENT DISPOSITION:  PACU - hemodynamically stable.   Delay start of Pharmacological VTE agent (>24hrs) due to surgical blood loss or risk of bleeding: not applicable

## 2016-09-24 NOTE — Consult Note (Signed)
Patient ID: Parthenia Amesony P Onorato, male   DOB: 07/10/1986, 30 y.o.   MRN: [id number]  HPI Parthenia Amesony P Defenbaugh is a 30 y.o. male with a history of HIV and with a CD4 greater than 400 and fired load almost undetectable and and he is compliant with his HIV medication presented with a 24-hour history of excruciating and sharp anorectal pain. The pain is 10 out of 10 and worsening when he applies pressure on the buttocks. Pain is not radiating. No fevers no chills no other constitutional symptoms. As part workup included a CT scan to have personally reviewed there is evidence of multiple lymphadenopathy but more importantly there is a small 3 cm abscess on the left side. Around the perineal area no evidence of necrotizing fasciitis or complications  HPI  Past Medical History:  Diagnosis Date  . HIV (human immunodeficiency virus infection) ([facility])     Past Surgical History:  Procedure Laterality Date  . none      No family history on file.  Social History Social History  Substance Use Topics  . Smoking status: Current Every Day Smoker    Packs/day: 0.50    Types: Cigarettes  . Smokeless tobacco: Never Used  . Alcohol use Yes     Comment: 18pk/day    No Known Allergies  Current Facility-Administered Medications  Medication Dose Route Frequency Provider Last Rate Last Dose  . acetaminophen (TYLENOL) tablet 650 mg  650 mg Oral Q6H PRN Arnaldo NatalMichael S Diamond, MD      . docusate sodium (COLACE) capsule 100 mg  100 mg Oral BID Arnaldo NatalMichael S Diamond, MD      . elvitegravir-cobicistat-emtricitabine-tenofovir (GENVOYA) 150-150-200-10 MG tablet 1 tablet  1 tablet Oral Daily Arnaldo NatalMichael S Diamond, MD      . enoxaparin (LOVENOX) injection 40 mg  40 mg Subcutaneous Q24H Arnaldo NatalMichael S Diamond, MD      . HYDROcodone-acetaminophen (NORCO/VICODIN) 5-325 MG per tablet 1 tablet  1 tablet Oral Q4H PRN Arnaldo NatalMichael S Diamond, MD   1 tablet at 09/24/16 0453  . hydrocortisone-pramoxine (PROCTOFOAM-HC) rectal foam 1 applicator  1 applicator  Rectal TID Arnaldo NatalMichael S Diamond, MD      . morphine 2 MG/ML injection 2 mg  2 mg Intravenous Q4H PRN Arnaldo NatalMichael S Diamond, MD   2 mg at 09/24/16 [id number]  . ondansetron (ZOFRAN) tablet 4 mg  4 mg Oral Q6H PRN Arnaldo NatalMichael S Diamond, MD       Or  . ondansetron [facility](ZOFRAN) injection 4 mg  4 mg Intravenous Q6H PRN Arnaldo NatalMichael S Diamond, MD      . piperacillin-tazobactam (ZOSYN) IVPB 3.375 g  3.375 g Intravenous Q8H Arnaldo NatalMichael S Diamond, MD         Review of Systems A 10 point review of systems was asked and was negative except for the information on the HPI  Physical Exam Blood pressure 124/70, pulse 82, temperature 99.7 F (37.6 C), temperature source Oral, resp. rate 18, height 6\' 1"  (1.854 m), weight 67.3 kg (148 lb 4.8 oz), SpO2 100 %. CONSTITUTIONAL: NAD EYES: Pupils are equal, round, and reactive to light, Sclera are non-icteric. EARS, NOSE, MOUTH AND THROAT: The oropharynx is clear. The oral mucosa is pink and moist. Hearing is intact to voice. LYMPH NODES:  Lymph nodes in the neck are normal. RESPIRATORY:  Lungs are clear. There is normal respiratory effort, with equal breath sounds bilaterally, and without pathologic use of accessory muscles. CARDIOVASCULAR: Heart is regular without murmurs, gallops, or rubs. GI: The abdomen is  soft, nontender, and nondistended. There are no palpable masses. There is no hepatosplenomegaly. There are normal bowel sounds in all quadrants. ZO:XWRUEAVWUGU:Exquisite tenderness left lateral position with a samll area of fluctuance MUSCULOSKELETAL: Normal muscle strength and tone. No cyanosis or edema.   SKIN: Turgor is good and there are no pathologic skin lesions or ulcers. NEUROLOGIC: Motor and sensation is grossly normal. Cranial nerves are grossly intact. PSYCH:  Oriented to person, place and time. Affect is normal.  Data Reviewed  I have personally reviewed the patient's imaging, laboratory findings and medical records.    Assessment/Plan 89-year-old with HIV and a small perianal  abscess. Extensive discussion with the patient about antibiotics versus and surgical exploration. Given the fact that he is immunocompromised and I do recommend proceeding for exam under anesthesia and drainage of a small perianal abscess. I also discussed with him that there is a chance that we may not able to drain much or even that there is only induration but I do feel that there is a small abscesses that we can drain. I discussed with the patient about the operation, risk, benefits and possible complications included but not limited to: Bleeding, infection, recurrence, anesthetic complications and pain. We'll find some time and schedule this morning and proceed with I&D. l Dr. Tonita CongWoodham wil be doing it today.   Sterling Bigiego Pabon, MD FACS General Surgeon 09/24/2016, 6:39 AM

## 2016-09-24 NOTE — ED Notes (Signed)
MD at bedside. Plan of care discussed. All questions answered.

## 2016-09-24 NOTE — Progress Notes (Signed)
  Visit with patient this evening. States he's feeling better than before surgery but still having significant pain.  Discussed the operation with the patient in detail the voiced understanding.  Plan to continue IV antibiotics tonight. Transition oral and buttocks in the morning and discharge home tomorrow.  Ricarda Frameharles Woodham, MD [facility] General Surgeon [facility]

## 2016-09-24 NOTE — Progress Notes (Signed)
Pharmacy Antibiotic Note  Gary [name] is a 30 y.o. male admitted on 09/23/2016 with wound infection.  Pharmacy has been consulted for Zosyn dosing.  Plan: Zosyn 3.375 grams q 8 hours ordered  Height: 6\' 1"  (185.4 cm) Weight: 148 lb 4.8 oz (67.3 kg) IBW/kg (Calculated) : 79.9  Temp (24hrs), Avg:98.9 F (37.2 C), Min:98.1 F (36.7 C), Max:99.7 F (37.6 C)   Recent Labs Lab 09/23/16 2146  WBC 7.4  CREATININE 0.75    Estimated Creatinine Clearance: 128.5 mL/min (by C-G formula based on SCr of 0.75 mg/dL).    No Known Allergies  Antimicrobials this admission: Zosyn 11/15  >>    >>   Dose adjustments this admission:   Microbiology results:  Thank you for allowing pharmacy to be a part of this patient's care.  McBane,Matthew S 09/24/2016 5:22 AM

## 2016-09-24 NOTE — Progress Notes (Signed)
Patient seen and examined.  Perirectal abscess.  Discussed the procedure of a perirectal abscess drainage and the patient detail. He voiced understanding and desires to proceed.  Will likely able be discharged later today after drainage of his abscess.  Ricarda Frameharles Woodham, MD [facility] General Surgeon [facility]

## 2016-09-24 NOTE — Anesthesia Procedure Notes (Signed)
Procedure Name: LMA Insertion Date/Time: 09/24/2016 9:45 AM Performed by: Karoline CaldwellSTARR, DEANA Pre-anesthesia Checklist: Patient identified, Emergency Drugs available, Suction available and Patient being monitored Patient Re-evaluated:Patient Re-evaluated prior to inductionOxygen Delivery Method: Circle system utilized Preoxygenation: Pre-oxygenation with 100% oxygen Intubation Type: IV induction Ventilation: Mask ventilation without difficulty LMA: LMA inserted LMA Size: 3.5 Number of attempts: 1 Placement Confirmation: positive ETCO2 and breath sounds checked- equal and bilateral Tube secured with: Tape Dental Injury: Teeth and Oropharynx as per pre-operative assessment

## 2016-09-24 NOTE — Progress Notes (Signed)
Surgeon Dr. Everlene FarrierPabon came to see and talked to pt. Advised pt to be on NPO for surgery anytime today. Pt instructed of the above.

## 2016-09-24 NOTE — Anesthesia Postprocedure Evaluation (Signed)
Anesthesia Post Note  Patient: Gary [name]  Procedure(s) Performed: Procedure(s) (LRB): IRRIGATION AND DEBRIDEMENT PERIRECTAL ABSCESS (N/A)  Patient location during evaluation: PACU Anesthesia Type: General Level of consciousness: awake and alert Pain management: pain level controlled Vital Signs Assessment: post-procedure vital signs reviewed and stable Respiratory status: spontaneous breathing, nonlabored ventilation, respiratory function stable and patient connected to nasal cannula oxygen Cardiovascular status: blood pressure returned to baseline and stable Postop Assessment: no signs of nausea or vomiting Anesthetic complications: no    Last Vitals:  Vitals:   09/24/16 1120 09/24/16 1153  BP: (!) 98/54 95/61  Pulse: 72 88  Resp: 18   Temp: 36.7 C     Last Pain:  Vitals:   09/24/16 1300  TempSrc:   PainSc: 2                  Yevette EdwardsJames G Adams

## 2016-09-24 NOTE — Transfer of Care (Signed)
Immediate Anesthesia Transfer of Care Note  Patient: Gary [name]  Procedure(s) Performed: Procedure(s): IRRIGATION AND DEBRIDEMENT PERIRECTAL ABSCESS (N/A)  Patient Location: PACU  Anesthesia Type:General  Level of Consciousness: awake and alert   Airway & Oxygen Therapy: Patient Spontanous Breathing and Patient connected to face mask oxygen  Post-op Assessment: Report given to RN and Post -op Vital signs reviewed and stable  Post vital signs: Reviewed and stable  Last Vitals:  Vitals:   09/24/16 0919 09/24/16 1020  BP: 119/79 (!) 98/53  Pulse: 85 79  Resp: 18 11  Temp: 36.8 C 36.8 C    Last Pain:  Vitals:   09/24/16 0919  TempSrc: Tympanic  PainSc: 5          Complications: No apparent anesthesia complications

## 2016-09-25 LAB — HELPER T-LYMPH-CD4 (ARMC ONLY)
% CD 4 Pos. Lymph.: 42.2 % (ref 30.8–58.5)
Absolute CD 4 Helper: 422 /uL (ref 359–1519)
Basophils Absolute: 0 10*3/uL (ref 0.0–0.2)
Basos: 1 %
EOS (ABSOLUTE): 0.1 10*3/uL (ref 0.0–0.4)
Eos: 2 %
Hematocrit: 41.1 % (ref 37.5–51.0)
Hemoglobin: 13.7 g/dL (ref 12.6–17.7)
Immature Grans (Abs): 0 10*3/uL (ref 0.0–0.1)
Immature Granulocytes: 0 %
Lymphocytes Absolute: 1 10*3/uL (ref 0.7–3.1)
Lymphs: 17 %
MCH: 31.6 pg (ref 26.6–33.0)
MCHC: 33.3 g/dL (ref 31.5–35.7)
MCV: 95 fL (ref 79–97)
Monocytes Absolute: 0.6 10*3/uL (ref 0.1–0.9)
Monocytes: 10 %
Neutrophils Absolute: 4.4 10*3/uL (ref 1.4–7.0)
Neutrophils: 70 %
Platelets: 214 10*3/uL (ref 150–379)
RBC: 4.34 x10E6/uL (ref 4.14–5.80)
RDW: 14 % (ref 12.3–15.4)
WBC: 6.2 10*3/uL (ref 3.4–10.8)

## 2016-09-25 LAB — HEMOGLOBIN A1C
Hgb A1c MFr Bld: 5.3 % (ref 4.8–5.6)
Mean Plasma Glucose: 105 mg/dL

## 2016-09-25 MED ORDER — AMOXICILLIN-POT CLAVULANATE 875-125 MG PO TABS: 1.0000 | ORAL_TABLET | Freq: Two times a day (BID) | ORAL | 0 refills | Status: DC

## 2016-09-25 MED ORDER — OXYCODONE HCL 5 MG PO TABS
5.0000 mg | ORAL_TABLET | ORAL | Status: DC | PRN
  Administered 2016-09-25: 5 mg via ORAL
  Filled 2016-09-25: qty 1

## 2016-09-25 MED ORDER — OXYCODONE HCL 5 MG PO TABS: 5.0000 mg | ORAL_TABLET | ORAL | 0 refills | Status: DC | PRN

## 2016-09-25 MED ORDER — AMOXICILLIN-POT CLAVULANATE 875-125 MG PO TABS
1.0000 | ORAL_TABLET | Freq: Two times a day (BID) | ORAL | Status: DC
  Administered 2016-09-25: 1 via ORAL
  Filled 2016-09-25: qty 1

## 2016-09-25 NOTE — Care Management (Signed)
Patient is active with [facility] and they assist patient in obtaining his medications.

## 2016-09-25 NOTE — Progress Notes (Signed)
  September 25, 2016  Patient: Gary [name]  Date of Birth: 12/25/1985  Date of Visit: 09/23/2016    To Whom It May Concern:  [facility] Rightony [facility] through 09/25/16. Gary Amesony P Whang  May return to work on 09/28/16..  Sincerely,

## 2016-09-25 NOTE — Discharge Summary (Signed)
Patient ID: Gary [name] MRN: [id number] DOB/AGE: 30/04/1986 30 y.o.  Admit date: 09/23/2016 Discharge date: 09/25/2016  Discharge Diagnoses:  Perirectal Abscess  Procedures Performed: Incision and Drainage of perirectal abscess  Discharged Condition: good  Hospital Course: Taken to OR from ER with perirectal abscess. Tolerated procedure well. On day of discharge he was tolerating a diet and pain was controlled with oral medications. Dressing was clean, dry and intact.  Discharge Orders: Home  Disposition: 01-Home or Self Care  Discharge Medications:   Medication List    TAKE these medications   amoxicillin-clavulanate 875-125 MG tablet Commonly known as:  AUGMENTIN Take 1 tablet by mouth every 12 (twelve) hours.   chlordiazePOXIDE 10 MG capsule Commonly known as:  LIBRIUM Day 1-2 Take 1 tablet PO TID Day 3-4 Take 1 tablet PO BID Day 5-6 Take 1 tablet PO QD Day 7 STOP   GENVOYA 150-150-200-10 MG Tabs tablet Generic drug:  elvitegravir-cobicistat-emtricitabine-tenofovir Take 1 tablet by mouth daily.   oxyCODONE 5 MG immediate release tablet Commonly known as:  Oxy IR/ROXICODONE Take 1 tablet (5 mg total) by mouth every 4 (four) hours as needed for moderate pain or severe pain.   pramoxine-hydrocortisone cream Apply topically 3 (three) times daily.        Follwup: Follow-up Information    Leafy Roiego F Pabon, MD. Go in 4 day(s).   Specialty:  General Surgery Why:  post I&D Contact information: [address] [place] [id number] [phone]           Signed: Ricarda FrameCharles Woodham 09/25/2016, 8:11 AM

## 2016-09-25 NOTE — Discharge Instructions (Signed)
Perirectal Abscess Introduction An abscess is an infected area that contains a collection of pus. A perirectal abscess is an abscess that is near the opening of the anus or around the rectum. A perirectal abscess can cause a lot of pain, especially during bowel movements. What are the causes? This condition is almost always caused by an infection that starts in an anal gland. What increases the risk? This condition is more likely to develop in:  People with diabetes or inflammatory bowel disease.  People whose body defense system (immune system) is weak.  People who have anal sex.  People who have a sexually transmitted disease (STD).  People who have certain kinds of cancers, such as rectal carcinoma, leukemia, or lymphoma. What are the signs or symptoms? The main symptom of this condition is pain. The pain may be a throbbing pain that gets worse during bowel movements. Other symptoms include:  Fever.  Swelling.  Redness.  Bleeding.  Constipation. How is this diagnosed? The condition is diagnosed with a physical exam. If the abscess is not visible, a health care provider may need to place a finger inside the rectum to find the abscess. Sometimes, imaging tests are done to determine the size and location of the abscess. These tests may include:  An ultrasound.  An MRI.  A CT scan. How is this treated? This condition is usually treated with incision and drainage surgery. Incision and drainage surgery involves making an incision over the abscess to drain the pus. Treatment may also involve antibiotic medicine, pain medicine, stool softeners, or laxatives. Follow these instructions at home:  Take medicines only as directed by your health care provider.  If you were prescribed an antibiotic, finish all of it even if you start to feel better.  To relieve pain, try sitting:  In a warm, shallow bath (sitz bath).  On a heating pad with the setting on low.  On an inflatable  donut-shaped cushion.  Follow any diet instructions as directed by your health care provider.  Keep all follow-up visits as directed by your health care provider. This is important. Contact a health care provider if:  Your abscess is bleeding.  You have pain, swelling, or redness that is getting worse.  You are constipated.  You feel ill.  You have muscle aches or chills.  You have a fever.  Your symptoms return after the abscess has healed. This information is not intended to replace advice given to you by your health care provider. Make sure you discuss any questions you have with your health care provider. Document Released: 10/23/2000 Document Revised: 04/02/2016 Document Reviewed: 09/05/2014  2017 Elsevier

## 2016-09-25 NOTE — Progress Notes (Signed)
Pt discharged to home via transport.  Discharge paperwork and follow-up instructions reviewed with patient.  Pt verbalized understanding of discharge instructions.  PIV d/c'd

## 2016-09-25 NOTE — Progress Notes (Signed)
Pt refused Lovenox injection and SCD's tonight. Education on blood clot prevention reinforced. Pt verbalized understanding but continued to refused. Pt is alert and oriented, independent with ADL's.

## 2016-09-25 NOTE — Progress Notes (Signed)
[facility] [place] [facility]   PATIENT NAME: Gary [name]    MR#:  [id number]  DATE OF BIRTH:  10/18/1986  SUBJECTIVE:  CHIEF COMPLAINT:   Chief Complaint  Patient presents with  . Abscess  . Withdrawal  The patient was seen at around 9 AM The patient is 30 year old Caucasian male with past medical history significant history of HIV, who presents to the hospital with complaints of buttock area pain, swelling, mostly in the left side of buttock cheek. Patient was admitted to the hospital for antibiotic therapy intravenously, seen by surgeon and underwent incision and drainage and just general anesthesia. He feels somewhat better today, still complains of perirectal pain. MAXIMUM TEMPERATURE is 99.7. The patient is being continued on Zosyn, to be changed to Augmentin upon discharge.    Review of Systems  Constitutional: Negative for chills, fever and weight loss.  HENT: Negative for congestion.   Eyes: Negative for blurred vision and double vision.  Respiratory: Negative for cough, sputum production, shortness of breath and wheezing.   Cardiovascular: Negative for chest pain, palpitations, orthopnea, leg swelling and PND.  Gastrointestinal: Negative for abdominal pain, blood in stool, constipation, diarrhea, nausea and vomiting.  Genitourinary: Negative for dysuria, frequency, hematuria and urgency.  Musculoskeletal: Negative for falls.  Neurological: Negative for dizziness, tremors, focal weakness and headaches.  Endo/Heme/Allergies: Does not bruise/bleed easily.  Psychiatric/Behavioral: Negative for depression. The patient does not have insomnia.    left buttock cheek pain and swelling  VITAL SIGNS: Blood pressure 99/68, pulse 77, temperature 98.1 F (36.7 C), temperature source Oral, resp. rate (!) 21, height 6\' 1"  (1.854 m), weight 67 kg (147 lb 11.3 oz), SpO2 97 %.  PHYSICAL EXAMINATION:   GENERAL:  30 y.o.-year-old patient lying in the bed  with no acute distress, mildly uncomfortable while turning in the bed due to significant discomfort and pressure in perirectal area.  EYES: Pupils equal, round, reactive to light and accommodation. No scleral icterus. Extraocular muscles intact.  HEENT: Head atraumatic, normocephalic. Oropharynx and nasopharynx clear.  NECK:  Supple, no jugular venous distention. No thyroid enlargement, no tenderness.  LUNGS: Normal breath sounds bilaterally, no wheezing, rales,rhonchi or crepitation. No use of accessory muscles of respiration.  CARDIOVASCULAR: S1, S2 normal. No murmurs, rubs, or gallops.  ABDOMEN: Soft, nontender, nondistended. Bowel sounds present. No organomegaly or mass.  EXTREMITIES: No pedal edema, cyanosis, or clubbing. Left buttock cleft area is mildly swollen, dressing is placed, no purulent discharge, no bleeding NEUROLOGIC: Cranial nerves II through XII are intact. Muscle strength 5/5 in all extremities. Sensation intact. Gait not checked.  PSYCHIATRIC: The patient is alert and oriented x 3.  SKIN: No obvious rash, lesion, or ulcer.   ORDERS/RESULTS REVIEWED:   CBC  Recent Labs Lab 09/23/16 2146 09/24/16 0337  WBC 7.4 6.2  HGB 13.5  --   HCT 38.8* 41.1  PLT 221 214  MCV 94.6 95  MCH 33.0 31.6  MCHC 34.9 33.3  RDW 13.4 14.0  LYMPHSABS 1.2 1.0  MONOABS 0.8  --   EOSABS 0.2 0.1  BASOSABS 0.0 0.0   ------------------------------------------------------------------------------------------------------------------  Chemistries   Recent Labs Lab 09/23/16 2146  NA 135  K 3.9  CL 100*  CO2 27  GLUCOSE 101*  BUN 8  CREATININE 0.75  CALCIUM 8.8*  AST 15  ALT 16*  ALKPHOS 65  BILITOT 0.5   ------------------------------------------------------------------------------------------------------------------ estimated creatinine clearance is 128 mL/min (by C-G formula based on SCr of 0.75  mg/dL). ------------------------------------------------------------------------------------------------------------------  Recent Labs  09/24/16 0454  TSH 2.720    Cardiac Enzymes No results for input(s): CKMB, TROPONINI, MYOGLOBIN in the last 168 hours.  Invalid input(s): CK ------------------------------------------------------------------------------------------------------------------ Invalid input(s): POCBNP ---------------------------------------------------------------------------------------------------------------  RADIOLOGY: Ct Abdomen Pelvis W Contrast  Result Date: 09/24/2016 CLINICAL DATA:  Acute onset of perianal pain, due to abscess. Initial encounter. EXAM: CT ABDOMEN AND PELVIS WITH CONTRAST TECHNIQUE: Multidetector CT imaging of the abdomen and pelvis was performed using the standard protocol following bolus administration of intravenous contrast. CONTRAST:  ISOVUE-300 IOPAMIDOL (ISOVUE-300) INJECTION 61% COMPARISON:  None. FINDINGS: Lower chest: The visualized lung bases are grossly clear. The visualized portions of the mediastinum are unremarkable. Hepatobiliary: The liver is unremarkable in appearance. The gallbladder is unremarkable in appearance. The common bile duct remains normal in caliber. Pancreas: The pancreas is within normal limits. Spleen: The spleen is unremarkable in appearance. Adrenals/Urinary Tract: The adrenal glands are unremarkable in appearance. The kidneys are within normal limits. There is no evidence of hydronephrosis. No renal or ureteral stones are identified. No perinephric stranding is seen. Stomach/Bowel: The stomach is unremarkable in appearance. The small bowel is within normal limits. The appendix is normal in caliber, without evidence of appendicitis. The colon is unremarkable in appearance. There is a mildly heterogeneous hyperattenuating mass to the right of the distal sigmoid colon, with surrounding prominent vasculature and soft  tissue inflammation. This measures approximately 2.9 x 2.5 cm, and given the lymphadenopathy described below, likely reflects an enlarged node. A few smaller adjacent nodes are seen. Vascular/Lymphatic: The abdominal aorta is unremarkable in appearance. Note is made of enlarged retroperitoneal nodes below the level of the aortic bifurcation medial to the left iliac vessels, measuring up to 3.2 cm in short axis, with slightly decreased central attenuation. Associated soft tissue inflammation is seen tracking into the pelvis. Reproductive: The bladder is moderately distended and grossly unremarkable. The prostate is borderline normal in size, with surrounding prominent vasculature. Other: Note is made of a focal 3.4 x 1.3 x 1.9 cm abscess along the left side of the anorectal canal, with minimal fluid tracking about the right side of the anorectal canal. There is vague prominence of the soft tissues about the anorectal canal, which may reflect chronic inflammation. Musculoskeletal: No acute osseous abnormalities are identified. The visualized musculature is unremarkable in appearance. IMPRESSION: 1. 3.4 x 1.3 x 1.9 cm abscess along the left side of the anorectal canal, with minimal fluid tracking about the right side of the anorectal canal. Vague prominence of the soft tissues about the anorectal canal, which may reflect chronic inflammation. 2. Multiple enlarged retroperitoneal nodes below the level of the aortic bifurcation medial to the left iliac vessels, measuring up to 3.2 cm in short axis, with slightly decreased central attenuation, raising concern for necrosis. This could reflect metastatic disease or lymphoma. Given surrounding soft tissue inflammation, this appears somewhat more aggressive than typically seen for lymphoproliferative disorder. 3. Associated mildly heterogeneous hyperattenuating mass to the right of the distal sigmoid colon, with surrounding prominent vasculature and soft tissue inflammation,  measuring 2.9 x 2.5 cm. This likely also reflects an enlarged node. Few smaller adjacent nodes seen. This could be amenable to percutaneous biopsy, as deemed clinically appropriate. Electronically Signed   By: Roanna Raider M.D.   On: 09/24/2016 02:03    EKG:  Orders placed or performed during the hospital encounter of 08/13/16  . ED EKG within 10 minutes  . ED EKG within 10 minutes  . EKG 12-Lead  .  EKG 12-Lead  . EKG    ASSESSMENT AND PLAN:  Active Problems:   Perirectal abscess  #1. Perirectal abscess, Status post incision and drainage by Dr. Tonita CongWoodham  16th of November 2017, the patient is being planned for discharge home on Augmentin, follow-up with surgery as outpatient, follow-up with primary care physician, Dr. Sampson GoonFitzgerald. Pain management with oxycodone #2 retroperitoneal lymphadenopathy of unclear etiology at this time, patient will need to have outpatient studies done after infection is  treated, he was advised to follow-up with Dr. Sampson GoonFitzgerald for recommendations #3. Hyperglycemia, hemoglobin A1c was 5.3, no diabetes #4. HIV, well controlled, continue follow-up with Dr. Sampson GoonFitzgerald as previously scheduled   Management plans discussed with the patient, family and they are in agreement.   DRUG ALLERGIES: No Known Allergies  CODE STATUS:     Code Status Orders        Start     Ordered   09/24/16 0430  Full code  Continuous     09/24/16 0429    Code Status History    Date Active Date Inactive Code Status Order ID Comments User Context   This patient has a current code status but no historical code status.      TOTAL TIME TAKING CARE OF THIS PATIENT: 20 minutes.    Katharina CaperVAICKUTE,RIMA M.D on 09/25/2016 at 3:27 PM  Between 7am to 6pm - Pager - [phone]  After 6pm go to [email or web address] - password [email or web address] [facility]  Office  [phone]  CC: Primary care physician; Mick SellFITZGERALD, DAVID P, MD

## 2016-09-26 LAB — AEROBIC/ANAEROBIC CULTURE (SURGICAL/DEEP WOUND): Culture: NO GROWTH

## 2016-09-26 LAB — AEROBIC/ANAEROBIC CULTURE W GRAM STAIN (SURGICAL/DEEP WOUND)

## 2016-09-28 ENCOUNTER — Ambulatory Visit: Payer: Self-pay | Admitting: Surgery

## 2016-09-28 ENCOUNTER — Telehealth: Payer: Self-pay | Admitting: Surgery

## 2016-09-28 NOTE — Telephone Encounter (Signed)
Patient was a no show for a post op appointment. Phone number is invalid

## 2016-10-01 ENCOUNTER — Emergency Department
Admission: EM | Admit: 2016-10-01 | Discharge: 2016-10-01 | Disposition: A | Discharge status: Home / Self Care | Payer: Self-pay | Attending: Emergency Medicine | Admitting: Emergency Medicine

## 2016-10-01 ENCOUNTER — Encounter: Payer: Self-pay | Admitting: Intensive Care

## 2016-10-01 DIAGNOSIS — F17210 Nicotine dependence, cigarettes, uncomplicated: Secondary | ICD-10-CM | POA: Insufficient documentation

## 2016-10-01 DIAGNOSIS — N4889 Other specified disorders of penis: Secondary | ICD-10-CM | POA: Insufficient documentation

## 2016-10-01 DIAGNOSIS — R369 Urethral discharge, unspecified: Secondary | ICD-10-CM

## 2016-10-01 DIAGNOSIS — Z21 Asymptomatic human immunodeficiency virus [HIV] infection status: Secondary | ICD-10-CM | POA: Insufficient documentation

## 2016-10-01 DIAGNOSIS — K611 Rectal abscess: Secondary | ICD-10-CM | POA: Insufficient documentation

## 2016-10-01 LAB — CBC WITH DIFFERENTIAL/PLATELET
Basophils Absolute: 0.1 10*3/uL (ref 0–0.1)
Basophils Relative: 1 %
Eosinophils Absolute: 0.2 10*3/uL (ref 0–0.7)
Eosinophils Relative: 2 %
HCT: 38 % — ABNORMAL LOW (ref 40.0–52.0)
Hemoglobin: 13.3 g/dL (ref 13.0–18.0)
Lymphocytes Relative: 19 %
Lymphs Abs: 1.9 10*3/uL (ref 1.0–3.6)
MCH: 33.5 pg (ref 26.0–34.0)
MCHC: 35.1 g/dL (ref 32.0–36.0)
MCV: 95.6 fL (ref 80.0–100.0)
Monocytes Absolute: 0.7 10*3/uL (ref 0.2–1.0)
Monocytes Relative: 7 %
Neutro Abs: 7.1 10*3/uL — ABNORMAL HIGH (ref 1.4–6.5)
Neutrophils Relative %: 71 %
Platelets: 301 10*3/uL (ref 150–440)
RBC: 3.98 MIL/uL — ABNORMAL LOW (ref 4.40–5.90)
RDW: 13.7 % (ref 11.5–14.5)
WBC: 10 10*3/uL (ref 3.8–10.6)

## 2016-10-01 LAB — CHLAMYDIA/NGC RT PCR (ARMC ONLY)
Chlamydia Tr: NOT DETECTED
N gonorrhoeae: NOT DETECTED

## 2016-10-01 LAB — COMPREHENSIVE METABOLIC PANEL
ALT: 12 U/L — ABNORMAL LOW (ref 17–63)
AST: 17 U/L (ref 15–41)
Albumin: 3.5 g/dL (ref 3.5–5.0)
Alkaline Phosphatase: 65 U/L (ref 38–126)
Anion gap: 7 (ref 5–15)
BUN: 7 mg/dL (ref 6–20)
CO2: 27 mmol/L (ref 22–32)
Calcium: 9.1 mg/dL (ref 8.9–10.3)
Chloride: 104 mmol/L (ref 101–111)
Creatinine, Ser: 0.9 mg/dL (ref 0.61–1.24)
GFR calc Af Amer: >60 mL/min (ref >60)
GFR calc non Af Amer: >60 mL/min (ref >60)
Glucose, Bld: 102 mg/dL — ABNORMAL HIGH (ref 65–99)
Potassium: 3.7 mmol/L (ref 3.5–5.1)
Sodium: 138 mmol/L (ref 135–145)
Total Bilirubin: 0.3 mg/dL (ref 0.3–1.2)
Total Protein: 7.6 g/dL (ref 6.5–8.1)

## 2016-10-01 MED ORDER — DOXYCYCLINE HYCLATE 100 MG PO TABS
100.0000 mg | ORAL_TABLET | Freq: Once | ORAL | Status: AC
  Administered 2016-10-01: 100 mg via ORAL
  Filled 2016-10-01: qty 1

## 2016-10-01 MED ORDER — MORPHINE SULFATE (PF) 4 MG/ML IV SOLN
2.0000 mg | Freq: Once | INTRAVENOUS | Status: AC
  Administered 2016-10-01: 2 mg via INTRAVENOUS
  Filled 2016-10-01: qty 1

## 2016-10-01 MED ORDER — DOXYCYCLINE HYCLATE 50 MG PO CAPS: 100.0000 mg | ORAL_CAPSULE | Freq: Two times a day (BID) | ORAL | 0 refills | Status: DC

## 2016-10-01 MED ORDER — ONDANSETRON HCL 4 MG/2ML IJ SOLN
4.0000 mg | Freq: Once | INTRAMUSCULAR | Status: AC
  Administered 2016-10-01: 4 mg via INTRAVENOUS
  Filled 2016-10-01: qty 2

## 2016-10-01 MED ORDER — LIDOCAINE HCL (PF) 1 % IJ SOLN
INTRAMUSCULAR | Status: AC
  Filled 2016-10-01: qty 5

## 2016-10-01 MED ORDER — CYCLOBENZAPRINE HCL 10 MG PO TABS
10.0000 mg | ORAL_TABLET | Freq: Once | ORAL | Status: AC
  Administered 2016-10-01: 10 mg via ORAL
  Filled 2016-10-01: qty 1

## 2016-10-01 MED ORDER — HYDROMORPHONE HCL 1 MG/ML IJ SOLN
1.0000 mg | Freq: Once | INTRAMUSCULAR | Status: AC
  Administered 2016-10-01: 1 mg via INTRAVENOUS
  Filled 2016-10-01: qty 1

## 2016-10-01 MED ORDER — CYCLOBENZAPRINE HCL 10 MG PO TABS: 10.0000 mg | ORAL_TABLET | Freq: Three times a day (TID) | ORAL | 0 refills | Status: DC | PRN

## 2016-10-01 MED ORDER — DEXTROSE 5 % IV SOLN
250.0000 mg | Freq: Once | INTRAVENOUS | Status: AC
  Administered 2016-10-01: 250 mg via INTRAVENOUS
  Filled 2016-10-01: qty 250

## 2016-10-01 NOTE — ED Triage Notes (Signed)
Patient presents to ER from [facility] for rectal abscess. Patient states " I had surgery by MD woodham last week for a rectal abscess and I noticed another one yesterday. I also think I might have an STD because I noticed white d/c from my penis yesterday" pt c/o pain at site of abscess

## 2016-10-01 NOTE — Discharge Instructions (Signed)
Return to the ER for any fever, worsening pain, vomiting, dizziness or passing out, or any other symptoms concerning to you.

## 2016-10-01 NOTE — Procedures (Signed)
Incision and Drainage Procedure Note  Pre-operative Diagnosis: Perirectal abscess  Post-operative Diagnosis: same  Indications: 30 yr old now with previous perirectal abscess that was drained on 11/16 on the left side, now patient has a kissing abscess on the right side that is approximately 2 cm in size.  Anesthesia: 1% plain lidocaine  Procedure Details  The procedure, risks and complications have been discussed in detail (including, but not limited to airway compromise, infection, bleeding) with the patient, and the patient has signed consent to the procedure.  The skin was sterilely prepped and draped over the affected area in the usual fashion. After adequate local anesthesia, I&D with a #11 blade was performed on the right side of the anus. Purulent drainage was present.  Area was thoroughly irrigated. The patient was observed until stable.  Findings: Purulent drainage from the perirectal abscess  EBL: 10 cc's  Drains: none  Condition: Tolerated procedure well   Complications: none.

## 2016-10-01 NOTE — H&P (Signed)
Gary [name] is an 30 y.o. male.   Chief Complaint: Anal pain HPI: 30-year-old male is well-known to the surgery service with multiple issues of HIV, alcohol abuse just recently in substance abuse center, and known perirectal abscess and fissure. Patient had a left-sided perirectal abscess drained with Dr. Woodham on 11/16 and a fissure at that time.  She was getting better from this and had been drinking and went into the substance abuse center. The patient forgot about his follow-up appointment on Monday. Patient states that a couple days ago he began having some pain in the rectal area on the opposite side of the area before. Patient states he also had some pain and burning whenever he urinated at around this time and had some whitish purulent type discharge at the meatus of his penis. Patient states that he had 2 sexual partners during the past week or so. Patient also states that he has had non-gonorrhea STD in the past and was treated however this was about 2 years ago. Patient does say that he has had some constipation and some harder stools are to the rectal pain coming on. Patient states he is also had some mucousy drainage from the area. Patient denies any bleeding any fevers or chills any nausea or vomiting or any diarrhea.  Past Medical History:  Diagnosis Date  . HIV (human immunodeficiency virus infection) (HCC)     Past Surgical History:  Procedure Laterality Date  . INCISION AND DRAINAGE PERIRECTAL ABSCESS N/A 09/24/2016   Procedure: IRRIGATION AND DEBRIDEMENT PERIRECTAL ABSCESS;  Surgeon: Charles Woodham, MD;  Location: [facility] ORS;  Service: General;  Laterality: N/A;  . none      Family History: Mother: MI, heart failure, Grandfather--colon CA in 60s  Social History:  reports that he has been smoking Cigarettes.  He has been smoking about 0.50 packs per day. He has never used smokeless tobacco. He reports that he drinks alcohol. He reports that he uses drugs, including  Marijuana.  Allergies: No Known Allergies   (Not in a hospital admission)  Results for orders placed or performed during the hospital encounter of 10/01/16 (from the past 48 hour(s))  CBC with Differential     Status: Abnormal   Collection Time: 10/01/16  5:10 PM  Result Value Ref Range   WBC 10.0 3.8 - 10.6 K/uL   RBC 3.98 (L) 4.40 - 5.90 MIL/uL   Hemoglobin 13.3 13.0 - 18.0 g/dL   HCT 38.0 (L) 40.0 - 52.0 %   MCV 95.6 80.0 - 100.0 fL   MCH 33.5 26.0 - 34.0 pg   MCHC 35.1 32.0 - 36.0 g/dL   RDW 13.7 11.5 - 14.5 %   Platelets 301 150 - 440 K/uL   Neutrophils Relative % 71 %   Neutro Abs 7.1 (H) 1.4 - 6.5 K/uL   Lymphocytes Relative 19 %   Lymphs Abs 1.9 1.0 - 3.6 K/uL   Monocytes Relative 7 %   Monocytes Absolute 0.7 0.2 - 1.0 K/uL   Eosinophils Relative 2 %   Eosinophils Absolute 0.2 0 - 0.7 K/uL   Basophils Relative 1 %   Basophils Absolute 0.1 0 - 0.1 K/uL  Comprehensive metabolic panel     Status: Abnormal   Collection Time: 10/01/16  5:10 PM  Result Value Ref Range   Sodium 138 135 - 145 mmol/L   Potassium 3.7 3.5 - 5.1 mmol/L   Chloride 104 101 - 111 mmol/L   CO2 27 22 - 32 mmol/L     Glucose, Bld 102 (H) 65 - 99 mg/dL   BUN 7 6 - 20 mg/dL   Creatinine, Ser 0.90 0.61 - 1.24 mg/dL   Calcium 9.1 8.9 - 10.3 mg/dL   Total Protein 7.6 6.5 - 8.1 g/dL   Albumin 3.5 3.5 - 5.0 g/dL   AST 17 15 - 41 U/L   ALT 12 (L) 17 - 63 U/L   Alkaline Phosphatase 65 38 - 126 U/L   Total Bilirubin 0.3 0.3 - 1.2 mg/dL   GFR calc non Af Amer >60 >60 mL/min   GFR calc Af Amer >60 >60 mL/min    Comment: (NOTE) The eGFR has been calculated using the CKD EPI equation. This calculation has not been validated in all clinical situations. eGFR's persistently <60 mL/min signify possible Chronic Kidney Disease.    Anion gap 7 5 - 15   No results found.  Review of Systems  Constitutional: Positive for malaise/fatigue. Negative for chills, diaphoresis, fever and weight loss.  HENT:  Negative for congestion and sore throat.   Respiratory: Negative for cough, sputum production, shortness of breath and wheezing.   Cardiovascular: Negative for chest pain, orthopnea, claudication and leg swelling.  Gastrointestinal: Positive for blood in stool and constipation. Negative for diarrhea, nausea and vomiting.  Genitourinary: Positive for dysuria, frequency, hematuria and urgency. Negative for flank pain.  Musculoskeletal: Negative for back pain and joint pain.  Skin: Negative for itching and rash.  Neurological: Positive for weakness. Negative for dizziness and loss of consciousness.  Psychiatric/Behavioral: Positive for substance abuse. Negative for depression. The patient is not nervous/anxious.   All other systems reviewed and are negative.   Blood pressure 115/76, pulse 96, temperature 98.1 F (36.7 C), temperature source Oral, resp. rate 18, height 6' 1" (1.854 m), weight 147 lb (66.7 kg), SpO2 100 %. Physical Exam  Vitals reviewed. Constitutional: He is oriented to person, place, and time. He appears well-developed and well-nourished. No distress.  HENT:  Head: Normocephalic and atraumatic.  Right Ear: External ear normal.  Left Ear: External ear normal.  Nose: Nose normal.  Mouth/Throat: Oropharynx is clear and moist. No oropharyngeal exudate.  Eyes: Conjunctivae and EOM are normal. Pupils are equal, round, and reactive to light. No scleral icterus.  Neck: Normal range of motion. Neck supple. No tracheal deviation present.  Cardiovascular: Normal rate, regular rhythm, normal heart sounds and intact distal pulses.  Exam reveals no gallop and no friction rub.   No murmur heard. Respiratory: Breath sounds normal. No respiratory distress. He has no wheezes. He has no rales.  GI: Soft. Bowel sounds are normal. He exhibits no distension. There is no tenderness. There is no rebound and no guarding.  Genitourinary:  Genitourinary Comments: Rectum: previous incision on left  side clean dry, no erythema or induration, right side with 2cm area of fluctuance, tenderness, induration, normal rectal tone, tender on right but no evidence of fissure and no fluctuance in anus  Musculoskeletal: Normal range of motion. He exhibits no edema, tenderness or deformity.  Neurological: He is alert and oriented to person, place, and time. No cranial nerve deficit.  Skin: Skin is warm and dry. No rash noted. No erythema. No pallor.  Psychiatric: He has a normal mood and affect. His behavior is normal. Judgment and thought content normal.     Assessment/Plan 30-year-old male with HIV and alcohol abuse and a history of perirectal abscesses is here for a perirectal abscess and a potential urinary tract infection or STD. I personally reviewed   the patient's past medical history to include his last visit here with Dr. Woodham on 11/16..  Patient did not go home with antibiotics on the last visit. I personally reviewed his laboratory values which are all within normal limits at this time however he does have a slight left shift. I have also reviewed his CT scan that was performed on 11/16 where he did have a large perirectal abscess on the left that was approximately 4 x 2 cm. The patient's abscess today is on the right side and is approximately 2 cm in diameter.  I discussed with the patient the risk and benefits of incision and drainage. I discussed with the patient that given the small size and superficial area that can be drained here in the emergency department. I do recommend an drainage drainage of the area with antibiotics this time given his immunocompromise state. I also discussed the risk of bleeding further infection need for further drainage and recurrence as well as pain. The patient was given opportunity to ask questions and have them answered and was in agreement with the plan.  I discussed his dysuria symptoms with the emergency department physician Dr. Lord as well who is performing  a necessary test. Additionally we discussed that he would go home on antibiotics either clindamycin or Bactrim and would follow up in my office in one week.  Catherine L Loflin, MD 10/01/2016, 9:08 PM   

## 2016-10-01 NOTE — ED Provider Notes (Signed)
[facility] Emergency Department Provider Note ____________________________________________   I have reviewed the triage vital signs and the triage nursing note.  HISTORY  Chief Complaint Abscess   Historian Patient  HPI Gary [name] is a 30 y.o. male with history of HIV and prior peri-rectal abcesses, one a year ago, and one last week, here for new swelling and pain at new location on the rectum.  Last Wednesday he had operative I and D of left perirectal asbcess, no antibiotics, reported improved.  2 days ago started having   Of note, stopped drinking alcohol on Monday, checked into detox at [facility] on Tuesday, is not feeling withdrawal symptoms now.    Past Medical History:  Diagnosis Date  . HIV (human immunodeficiency virus infection) [facility](HCC)     Patient Active Problem List   Diagnosis Date Noted  . Perirectal abscess 09/24/2016    Past Surgical History:  Procedure Laterality Date  . INCISION AND DRAINAGE PERIRECTAL ABSCESS N/A 09/24/2016   Procedure: IRRIGATION AND DEBRIDEMENT PERIRECTAL ABSCESS;  Surgeon: Ricarda Frameharles Woodham, MD;  Location: [facility] ORS;  Service: General;  Laterality: N/A;  . none      Prior to Admission medications   Medication Sig Start Date End Date Taking? Authorizing Provider  amoxicillin-clavulanate (AUGMENTIN) 875-125 MG tablet Take 1 tablet by mouth every 12 (twelve) hours. 09/25/16   Ricarda Frameharles Woodham, MD  chlordiazePOXIDE (LIBRIUM) 10 MG capsule Day 1-2 Take 1 tablet PO TID Day 3-4 Take 1 tablet PO BID Day 5-6 Take 1 tablet PO QD Day 7 STOP 09/16/16   Minna AntisKevin Paduchowski, MD  cyclobenzaprine (FLEXERIL) 10 MG tablet Take 1 tablet (10 mg total) by mouth 3 (three) times daily as needed for muscle spasms. 10/01/16   Governor Rooksebecca Lord, MD  doxycycline (VIBRAMYCIN) 50 MG capsule Take 2 capsules (100 mg total) by mouth 2 (two) times daily. 10/01/16   Governor Rooksebecca Lord, MD  GENVOYA 150-150-200-10 MG TABS tablet Take 1 tablet by mouth daily.  06/30/16   Historical Provider, MD  oxyCODONE (OXY IR/ROXICODONE) 5 MG immediate release tablet Take 1 tablet (5 mg total) by mouth every 4 (four) hours as needed for moderate pain or severe pain. 09/25/16   Ricarda Frameharles Woodham, MD  pramoxine-hydrocortisone cream Apply topically 3 (three) times daily. 09/18/16   Tommi Rumpshonda L Summers, PA-C    No Known Allergies  History reviewed. No pertinent family history.  Social History Social History  Substance Use Topics  . Smoking status: Current Every Day Smoker    Packs/day: 0.50    Types: Cigarettes  . Smokeless tobacco: Never Used  . Alcohol use Yes     Comment: 18pk/day    Review of Systems  Constitutional: Negative for fever. Eyes: Negative for visual changes. ENT: Negative for sore throat. Cardiovascular: Negative for chest pain. Respiratory: Negative for shortness of breath. Gastrointestinal: Negative for abdominal pain, vomiting and diarrhea.  He has had frequent loose stools he states due to alcohol detox. Genitourinary: Negative for dysuria. Musculoskeletal: Negative for back pain. Skin: Negative for rash. Neurological: Negative for headache. 10 point Review of Systems otherwise negative ____________________________________________   PHYSICAL EXAM:  VITAL SIGNS: ED Triage Vitals  Enc Vitals Group     BP 10/01/16 1701 115/76     Pulse Rate 10/01/16 1701 96     Resp 10/01/16 1701 18     Temp 10/01/16 1701 98.1 F (36.7 C)     Temp Source 10/01/16 1701 Oral     SpO2 10/01/16 1701 100 %  Weight 10/01/16 1703 147 lb (66.7 kg)     Height 10/01/16 1703 6\' 1"  (1.854 m)     Head Circumference --      Peak Flow --      Pain Score 10/01/16 1703 8     Pain Loc --      Pain Edu? --      Excl. in GC? --      Constitutional: Alert and oriented. Well appearing and in no distress. HEENT   Head: Normocephalic and atraumatic.      Eyes: Conjunctivae are normal. PERRL. Normal extraocular movements.      Ears:         Nose: No  congestion/rhinnorhea.   Mouth/Throat: Mucous membranes are moist.   Neck: No stridor. Cardiovascular/Chest: Normal rate, regular rhythm.  No murmurs, rubs, or gallops. Respiratory: Normal respiratory effort without tachypnea nor retractions. Breath sounds are clear and equal bilaterally. No wheezes/rales/rhonchi. Gastrointestinal: Soft. No distention, no guarding, no rebound. Nontender.   Genitourinary/rectal:Nontender left-sided the rectum with incision site visible. Tender small fluctuant area at the right rectal entrance. Musculoskeletal: Nontender with normal range of motion in all extremities. No joint effusions.  No lower extremity tenderness.  No edema. Neurologic:  Normal speech and language. No gross or focal neurologic deficits are appreciated. Skin:  Skin is warm, dry and intact. No rash noted. Psychiatric: Mood and affect are normal. Speech and behavior are normal. Patient exhibits appropriate insight and judgment.   ____________________________________________  LABS (pertinent positives/negatives)  Labs Reviewed  CBC WITH DIFFERENTIAL/PLATELET - Abnormal; Notable for the following:       Result Value   RBC 3.98 (*)    HCT 38.0 (*)    Neutro Abs 7.1 (*)    All other components within normal limits  COMPREHENSIVE METABOLIC PANEL - Abnormal; Notable for the following:    Glucose, Bld 102 (*)    ALT 12 (*)    All other components within normal limits  CHLAMYDIA/NGC RT PCR ([facility] ONLY)    ____________________________________________    EKG I, Governor Rooks, MD, the attending physician have personally viewed and interpreted all ECGs.  None ____________________________________________  RADIOLOGY All Xrays were viewed by me. Imaging interpreted by Radiologist.  None __________________________________________  PROCEDURES  Procedure(s) performed: None  Critical Care performed: None  ____________________________________________   ED COURSE /  ASSESSMENT AND PLAN  Pertinent labs & imaging results that were available during my care of the patient were reviewed by me and considered in my medical decision making (see chart for details).   Gary [name] has new perirectal abcess, new location and worsening since had improved from I and D last week.  Did not go to follow up appointment, entered detox program.  No concern for sepsis clinically. WBC ct is normal, but slight left shift.  Denies fevers and is afebrile.  Dr. Orvis Brill to evaluate in the ED for next steps -- I and D vs abx vs. Imaging.   Small abscess was drained by Dr. Orvis Brill.  She recommended antibiotic treatment, and given the patient's complaint of penis discharge, I am going to treat for possible gonorrhea and Chlamydia as urinalysis test was sent but not back prior to discharge. I am going to give dose of Rocephin here and doxycycline which will cover both STD as well as for the post I&D infection.  Patient will be given one more dose of pain medicine prior to discharge.  CONSULTATIONS:   Dr. Orvis Brill, general surgery for consult in the  ED.   Patient / Family / Caregiver informed of clinical course, medical decision-making process, and agree with plan.   I discussed return precautions, follow-up instructions, and discharge instructions with patient and/or family.   ___________________________________________   FINAL CLINICAL IMPRESSION(S) / ED DIAGNOSES   Final diagnoses:  Perirectal abscess              Note: This dictation was prepared with Dragon dictation. Any transcriptional errors that result from this process are unintentional    Governor Rooksebecca Lord, MD 10/01/16 2040

## 2016-10-01 NOTE — ED Notes (Addendum)
General Surgery MD at bedside.

## 2016-10-04 ENCOUNTER — Emergency Department: Payer: Self-pay

## 2016-10-04 ENCOUNTER — Encounter: Payer: Self-pay | Admitting: Emergency Medicine

## 2016-10-04 ENCOUNTER — Emergency Department
Admission: EM | Admit: 2016-10-04 | Discharge: 2016-10-04 | Disposition: A | Discharge status: Home / Self Care | Payer: Self-pay | Attending: Emergency Medicine | Admitting: Emergency Medicine

## 2016-10-04 DIAGNOSIS — R079 Chest pain, unspecified: Secondary | ICD-10-CM | POA: Insufficient documentation

## 2016-10-04 DIAGNOSIS — F17210 Nicotine dependence, cigarettes, uncomplicated: Secondary | ICD-10-CM | POA: Insufficient documentation

## 2016-10-04 DIAGNOSIS — Z79899 Other long term (current) drug therapy: Secondary | ICD-10-CM | POA: Insufficient documentation

## 2016-10-04 DIAGNOSIS — K611 Rectal abscess: Secondary | ICD-10-CM | POA: Insufficient documentation

## 2016-10-04 LAB — CBC
HCT: 38.9 % — ABNORMAL LOW (ref 40.0–52.0)
Hemoglobin: 13.2 g/dL (ref 13.0–18.0)
MCH: 32.2 pg (ref 26.0–34.0)
MCHC: 34 g/dL (ref 32.0–36.0)
MCV: 94.9 fL (ref 80.0–100.0)
Platelets: 363 10*3/uL (ref 150–440)
RBC: 4.1 MIL/uL — ABNORMAL LOW (ref 4.40–5.90)
RDW: 13.6 % (ref 11.5–14.5)
WBC: 11.8 10*3/uL — ABNORMAL HIGH (ref 3.8–10.6)

## 2016-10-04 LAB — BASIC METABOLIC PANEL
Anion gap: 10 (ref 5–15)
BUN: 11 mg/dL (ref 6–20)
CO2: 24 mmol/L (ref 22–32)
Calcium: 9.4 mg/dL (ref 8.9–10.3)
Chloride: 100 mmol/L — ABNORMAL LOW (ref 101–111)
Creatinine, Ser: 0.87 mg/dL (ref 0.61–1.24)
GFR calc Af Amer: >60 mL/min (ref >60)
GFR calc non Af Amer: >60 mL/min (ref >60)
Glucose, Bld: 99 mg/dL (ref 65–99)
Potassium: 3.7 mmol/L (ref 3.5–5.1)
Sodium: 134 mmol/L — ABNORMAL LOW (ref 135–145)

## 2016-10-04 LAB — TROPONIN I: Troponin I: <0.03 ng/mL (ref <0.03)

## 2016-10-04 IMAGING — CR DG CHEST 2V
1 series · 2 of 2 positions shown · non-contrast
Comparison: [DATE]

CLINICAL DATA: Diffuse chest tightness with left-sided chest pain
and left arm pain tonight. Patient did meth yesterday. History of
HIV. Smoker.

EXAM:
CHEST  2 VIEW

[Series 1: dg chest 2 view · 0.14mm/px · 2 of 2 slices shown]
[im 1/2]
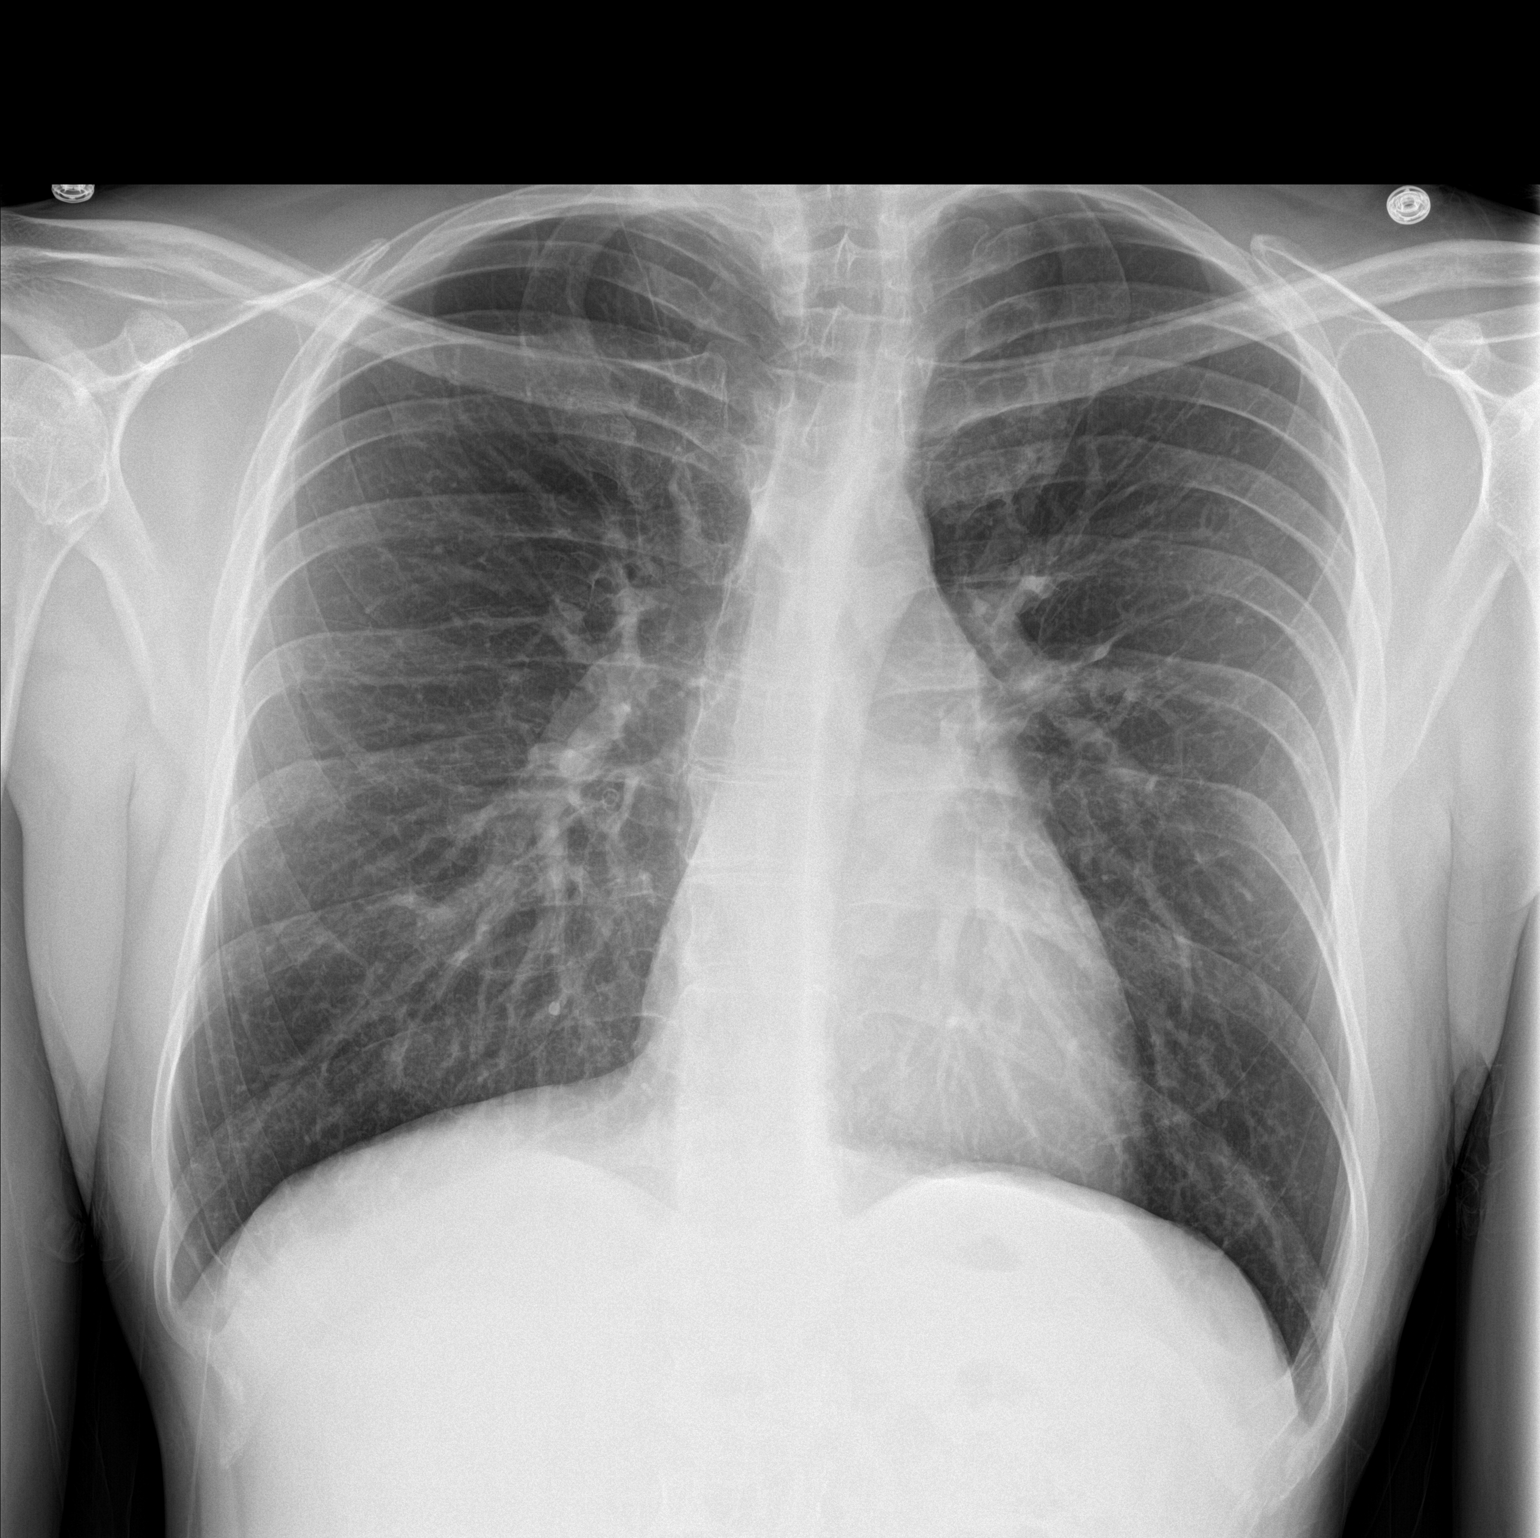
[im 2/2]
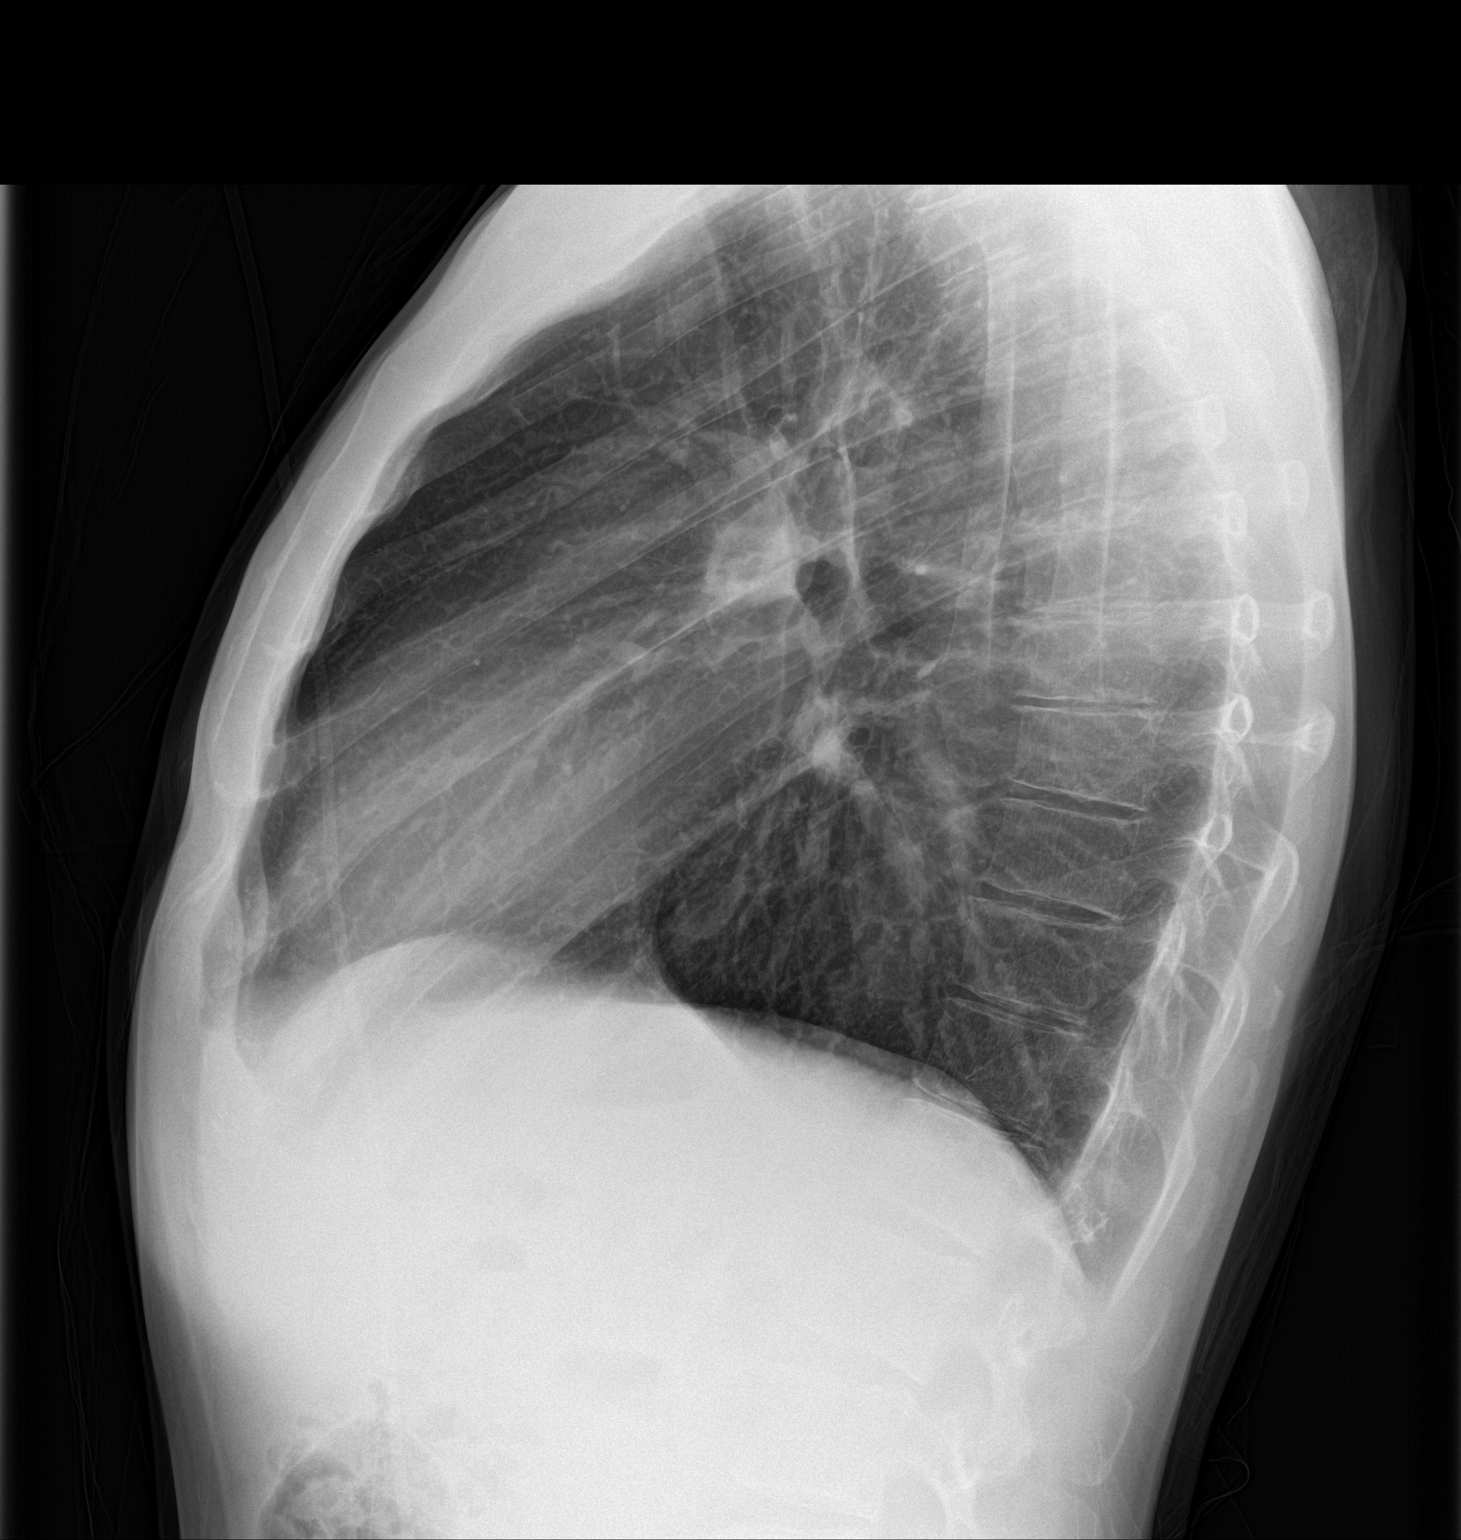

[2 of 2 positions shown; findings below may reference images not displayed]

FINDINGS: Pulmonary hyperinflation. The heart size and mediastinal contours
are within normal limits. Both lungs are clear. The visualized
skeletal structures are unremarkable.
IMPRESSION: Hyperinflation.  No active cardiopulmonary disease.

## 2016-10-04 MED ORDER — SULFAMETHOXAZOLE-TRIMETHOPRIM 800-160 MG PO TABS
1.0000 | ORAL_TABLET | Freq: Two times a day (BID) | ORAL | 0 refills | Status: DC
Start: 2016-10-04 — End: 2018-06-15

## 2016-10-04 MED ORDER — MORPHINE SULFATE (PF) 4 MG/ML IV SOLN
INTRAVENOUS | Status: AC
  Administered 2016-10-04: 4 mg via INTRAVENOUS
  Filled 2016-10-04: qty 1

## 2016-10-04 MED ORDER — ONDANSETRON HCL 4 MG/2ML IJ SOLN
4.0000 mg | Freq: Once | INTRAMUSCULAR | Status: AC
  Administered 2016-10-04: 4 mg via INTRAVENOUS

## 2016-10-04 MED ORDER — SODIUM CHLORIDE 0.9 % IV BOLUS (SEPSIS)
1000.0000 mL | Freq: Once | INTRAVENOUS | Status: AC
  Administered 2016-10-04: 1000 mL via INTRAVENOUS

## 2016-10-04 MED ORDER — MORPHINE SULFATE (PF) 4 MG/ML IV SOLN
4.0000 mg | Freq: Once | INTRAVENOUS | Status: AC
  Administered 2016-10-04: 4 mg via INTRAVENOUS

## 2016-10-04 MED ORDER — ONDANSETRON HCL 4 MG/2ML IJ SOLN
INTRAMUSCULAR | Status: AC
  Administered 2016-10-04: 4 mg via INTRAVENOUS
  Filled 2016-10-04: qty 2

## 2016-10-04 NOTE — Discharge Instructions (Signed)
You have been seen in the emergency department today for chest pain. Your workup has shown normal results. As we discussed please follow-up with your primary care physician in the next 1-2 days for recheck. Return to the emergency department for any further chest pain, trouble breathing, or any other symptom personally concerning to yourself.  As we discussed please call the number provided for general surgery to arrange a follow-up appointment as soon as possible. Please fill and take your antibiotic as prescribed. As we discussed it is on the [facility] $4 list.

## 2016-10-04 NOTE — ED Provider Notes (Signed)
[facility] Emergency Department Provider Note  Time seen: 9:31 PM  I have reviewed the triage vital signs and the nursing notes.   HISTORY  Chief Complaint Abscess    HPI Parthenia Amesony P Mehringer is a 30 y.o. male with a past medical history of HIV, alcohol and drug use who presents the emergency department for chest pain and continued rectal pain and drainage. According to the patient and record review the patient was seen in the emergency department 10/01/16 for a perirectal abscess this was incised and drained by Dr. Orvis BrillLoflin of Gen. surgery. Patient states continued pain and drainage since the incision. Patient states today he called EMS because he was having chest pain that started around lunchtime today. Patient does admit drinking 140 ounce alcoholic beverage today. 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 Denies any drug use today. Took his prescribed 5 mg oxycodone tablet at home prior to arrival in the emergency department. Denies shortness of breath. States he did get clammy earlier today but thought that could've been from the medication. Denies any nausea currently.  Past Medical History:  Diagnosis Date  . HIV (human immunodeficiency virus infection) [facility](HCC)     Patient Active Problem List   Diagnosis Date Noted  . Perirectal abscess 09/24/2016    Past Surgical History:  Procedure Laterality Date  . INCISION AND DRAINAGE PERIRECTAL ABSCESS N/A 09/24/2016   Procedure: IRRIGATION AND DEBRIDEMENT PERIRECTAL ABSCESS;  Surgeon: Ricarda Frameharles Woodham, MD;  Location: [facility] ORS;  Service: General;  Laterality: N/A;  . none      Prior to Admission medications   Medication Sig Start Date End Date Taking? Authorizing Provider  amoxicillin-clavulanate (AUGMENTIN) 875-125 MG tablet Take 1 tablet by mouth every 12 (twelve) hours. 09/25/16   Ricarda Frameharles Woodham, MD  chlordiazePOXIDE (LIBRIUM) 10 MG capsule Day 1-2 Take 1 tablet PO TID Day 3-4 Take 1 tablet PO BID Day 5-6 Take 1  tablet PO QD Day 7 STOP 09/16/16   Minna AntisKevin Paduchowski, MD  cyclobenzaprine (FLEXERIL) 10 MG tablet Take 1 tablet (10 mg total) by mouth 3 (three) times daily as needed for muscle spasms. 10/01/16   Governor Rooksebecca Lord, MD  doxycycline (VIBRAMYCIN) 50 MG capsule Take 2 capsules (100 mg total) by mouth 2 (two) times daily. 10/01/16   Governor Rooksebecca Lord, MD  GENVOYA 150-150-200-10 MG TABS tablet Take 1 tablet by mouth daily. 06/30/16   Historical Provider, MD  oxyCODONE (OXY IR/ROXICODONE) 5 MG immediate release tablet Take 1 tablet (5 mg total) by mouth every 4 (four) hours as needed for moderate pain or severe pain. 09/25/16   Ricarda Frameharles Woodham, MD  pramoxine-hydrocortisone cream Apply topically 3 (three) times daily. 09/18/16   Tommi Rumpshonda L Summers, PA-C    No Known Allergies  No family history on file.  Social History Social History  Substance Use Topics  . Smoking status: Current Every Day Smoker    Packs/day: 0.50    Types: Cigarettes  . Smokeless tobacco: Never Used  . Alcohol use Yes     Comment: 18pk/day    Review of Systems Constitutional: Negative for fever. Cardiovascular: Positive for chest pain, now improved. Respiratory: Negative for shortness of breath. Gastrointestinal: Negative for abdominal pain. Positive for perirectal pain and drainage. Genitourinary: Negative for dysuria. Neurological: Negative for headache 10-point ROS otherwise negative.  ____________________________________________   PHYSICAL EXAM:  VITAL SIGNS: ED Triage Vitals  Enc Vitals Group     BP 10/04/16 2020 114/77     Pulse Rate 10/04/16 2020 75  Resp 10/04/16 2020 18     Temp 10/04/16 2020 99.2 F (37.3 C)     Temp Source 10/04/16 2020 Oral     SpO2 10/04/16 2020 100 %     Weight 10/04/16 2019 147 lb (66.7 kg)     Height 10/04/16 2019 6\' 1"  (1.854 m)     Head Circumference --      Peak Flow --      Pain Score 10/04/16 2019 9     Pain Loc --      Pain Edu? --      Excl. in GC? --      Constitutional: Alert and oriented. Well appearing and in no distress. Eyes: Normal exam ENT   Head: Normocephalic and atraumatic.   Mouth/Throat: Mucous membranes are moist. Cardiovascular: Normal rate, regular rhythm. No murmur Respiratory: Normal respiratory effort without tachypnea nor retractions. Breath sounds are clear  Gastrointestinal: Soft and nontender. No distention.  Rectal examination shows mild continued drainage from right buttock perirectal abscess. Mild induration. Musculoskeletal: Nontender with normal range of motion in all extremities.  Neurologic:  Normal speech and language. No gross focal neurologic deficits Skin:  Skin is warm, dry  Psychiatric: Mood and affect are normal. Speech and behavior are normal.   ____________________________________________    EKG  EKG reviewed and interpreted by myself shows normal sinus rhythm at 63 bpm, narrow QRS, left axis deviation, largely normal intervals and no concerning ST changes.  ____________________________________________    INITIAL IMPRESSION / ASSESSMENT AND PLAN / ED COURSE  Pertinent labs & imaging results that were available during my care of the patient were reviewed by me and considered in my medical decision making (see chart for details).  The patient presents to the emergency department with chest pain that started around lunchtime today. Patient denies shortness of breath. He states the chest pain as per the much gone at this time however he continues to have perirectal pain and drainage. Patient had an incision and drainage performed of a perirectal abscess 10/01/16. States he's had continued pain since then, taking his pain medication as prescribed at home. States continued drainage since then as well. No sign of worsening infection, the patient does continue to have drainage in the emergency department. No fever. We will check labs including cardiac enzymes. Chest x-ray and EKG. Therefore cup is  within normal limits we will have the patient follow-up with general surgery for his perirectal abscess. Patient is agreeable.  Labs are largely within normal limits besides a very slight leukocytosis of 11,000. Patient continues to have drainage from his perirectal abscess, no fever. Patient's cardiac workup is normal, EKG is reassuring. Chest x-ray is negative. Labs including cardiac enzymes are negative. Patient will be discharged home with follow-up with Dr.Loflin. Patient is prescribed doxycycline on 10/01/16. Patient states he cannot afford the doxycycline. I discussed placing the patient on Bactrim, he states he can afford a $4 antibiotic, and will get it filled.. ____________________________________________   FINAL CLINICAL IMPRESSION(S) / ED DIAGNOSES  Chest pain Perirectal abscess    Minna AntisKevin Paduchowski, MD 10/04/16 2232

## 2016-10-04 NOTE — ED Triage Notes (Signed)
Pt reports having a sharp pain from rectum where abscess was recently lanced.

## 2016-10-05 ENCOUNTER — Telehealth: Payer: Self-pay

## 2016-10-05 NOTE — Telephone Encounter (Signed)
Patient called in at this time and is requesting to be seen for a follow-up in regards to his Peri-rectal abscess. Patient was NO SHOW for follow-up last week. Offered several times and both locations this week with 2 different providers but patient does not know when he can arrange transportation to get here. I asked patient to find out when he can get transportation for an appointment and call me back with these dates and times so that I can compare surgeon's schedule against these times.

## 2016-10-06 ENCOUNTER — Ambulatory Visit: Payer: Self-pay | Admitting: Surgery

## 2016-10-08 ENCOUNTER — Ambulatory Visit: Payer: Self-pay | Admitting: Surgery

## 2016-10-14 ENCOUNTER — Ambulatory Visit: Payer: Self-pay | Admitting: Surgery

## 2017-07-19 ENCOUNTER — Emergency Department (HOSPITAL_COMMUNITY)
Admission: EM | Admit: 2017-07-19 | Discharge: 2017-07-20 | Disposition: A | Discharge status: Home / Self Care | Payer: Self-pay | Attending: Emergency Medicine | Admitting: Emergency Medicine

## 2017-07-19 ENCOUNTER — Emergency Department (HOSPITAL_COMMUNITY): Payer: Self-pay

## 2017-07-19 ENCOUNTER — Encounter (HOSPITAL_COMMUNITY): Payer: Self-pay | Admitting: Emergency Medicine

## 2017-07-19 DIAGNOSIS — S80212A Abrasion, left knee, initial encounter: Secondary | ICD-10-CM | POA: Insufficient documentation

## 2017-07-19 DIAGNOSIS — T07XXXA Unspecified multiple injuries, initial encounter: Secondary | ICD-10-CM

## 2017-07-19 DIAGNOSIS — S0990XA Unspecified injury of head, initial encounter: Secondary | ICD-10-CM | POA: Insufficient documentation

## 2017-07-19 DIAGNOSIS — Z23 Encounter for immunization: Secondary | ICD-10-CM | POA: Insufficient documentation

## 2017-07-19 DIAGNOSIS — Z79899 Other long term (current) drug therapy: Secondary | ICD-10-CM | POA: Insufficient documentation

## 2017-07-19 DIAGNOSIS — F17210 Nicotine dependence, cigarettes, uncomplicated: Secondary | ICD-10-CM | POA: Insufficient documentation

## 2017-07-19 DIAGNOSIS — Y939 Activity, unspecified: Secondary | ICD-10-CM | POA: Insufficient documentation

## 2017-07-19 DIAGNOSIS — Y998 Other external cause status: Secondary | ICD-10-CM | POA: Insufficient documentation

## 2017-07-19 DIAGNOSIS — S0081XA Abrasion of other part of head, initial encounter: Secondary | ICD-10-CM | POA: Insufficient documentation

## 2017-07-19 DIAGNOSIS — H6120 Impacted cerumen, unspecified ear: Secondary | ICD-10-CM | POA: Insufficient documentation

## 2017-07-19 DIAGNOSIS — Y929 Unspecified place or not applicable: Secondary | ICD-10-CM | POA: Insufficient documentation

## 2017-07-19 DIAGNOSIS — S80211A Abrasion, right knee, initial encounter: Secondary | ICD-10-CM | POA: Insufficient documentation
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
## 2017-07-19 MED ORDER — ONDANSETRON 4 MG PO TBDP
4.0000 mg | ORAL_TABLET | Freq: Once | ORAL | Status: DC
  Filled 2017-07-19: qty 1

## 2017-07-19 MED ORDER — OXYCODONE-ACETAMINOPHEN 5-325 MG PO TABS
2.0000 | ORAL_TABLET | Freq: Once | ORAL | Status: AC
  Administered 2017-07-19: 2 via ORAL
  Filled 2017-07-19: qty 2

## 2017-07-19 MED ORDER — TETANUS-DIPHTH-ACELL PERTUSSIS 5-2.5-18.5 LF-MCG/0.5 IM SUSP
0.5000 mL | Freq: Once | INTRAMUSCULAR | Status: AC
  Administered 2017-07-19: 0.5 mL via INTRAMUSCULAR
  Filled 2017-07-19: qty 0.5
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
## 2017-07-19 NOTE — ED Notes (Signed)
Patient transported to X-ray 

## 2017-07-20 MED ORDER — FENTANYL CITRATE (PF) 100 MCG/2ML IJ SOLN
100.0000 ug | Freq: Once | INTRAMUSCULAR | Status: AC
  Administered 2017-07-20: 100 ug via INTRAVENOUS
  Filled 2017-07-20: qty 2

## 2017-07-20 MED ORDER — HYDROMORPHONE HCL 1 MG/ML IJ SOLN: 1.0000 mg | Freq: Once | INTRAMUSCULAR | Status: DC

## 2017-07-20 MED ORDER — OXYCODONE-ACETAMINOPHEN 5-325 MG PO TABS: 1.0000 | ORAL_TABLET | Freq: Four times a day (QID) | ORAL | 0 refills | Status: DC | PRN

## 2017-07-20 MED ORDER — ONDANSETRON 4 MG PO TBDP: 4.0000 mg | ORAL_TABLET | Freq: Three times a day (TID) | ORAL | 0 refills | Status: DC | PRN

## 2017-07-20 NOTE — ED Notes (Signed)
Pt verbalized understanding of d/c instructions and has no further questions. Pt is stable, A&Ox4, VSS.  

## 2017-07-20 NOTE — ED Notes (Signed)
Pt ear cleaned up and ear flushed

## 2018-01-30 ENCOUNTER — Other Ambulatory Visit: Payer: Self-pay

## 2018-01-30 ENCOUNTER — Emergency Department (HOSPITAL_COMMUNITY)
Admission: EM | Admit: 2018-01-30 | Discharge: 2018-01-30 | Disposition: A | Discharge status: Home / Self Care | Payer: Self-pay | Attending: Emergency Medicine | Admitting: Emergency Medicine

## 2018-01-30 ENCOUNTER — Encounter (HOSPITAL_COMMUNITY): Payer: Self-pay

## 2018-01-30 DIAGNOSIS — N4889 Other specified disorders of penis: Secondary | ICD-10-CM | POA: Insufficient documentation

## 2018-01-30 DIAGNOSIS — Z21 Asymptomatic human immunodeficiency virus [HIV] infection status: Secondary | ICD-10-CM | POA: Insufficient documentation

## 2018-01-30 DIAGNOSIS — Z79899 Other long term (current) drug therapy: Secondary | ICD-10-CM | POA: Insufficient documentation

## 2018-01-30 DIAGNOSIS — R369 Urethral discharge, unspecified: Secondary | ICD-10-CM

## 2018-01-30 DIAGNOSIS — F17210 Nicotine dependence, cigarettes, uncomplicated: Secondary | ICD-10-CM | POA: Insufficient documentation

## 2018-01-30 LAB — URINALYSIS, ROUTINE W REFLEX MICROSCOPIC
Bacteria, UA: NONE SEEN
Bilirubin Urine: NEGATIVE
Glucose, UA: NEGATIVE mg/dL
Hgb urine dipstick: NEGATIVE
Ketones, ur: NEGATIVE mg/dL
Nitrite: NEGATIVE
Protein, ur: NEGATIVE mg/dL
Specific Gravity, Urine: 1.016 (ref 1.005–1.030)
Squamous Epithelial / LPF: NONE SEEN
pH: 5 (ref 5.0–8.0)

## 2018-01-30 MED ORDER — ONDANSETRON 4 MG PO TBDP
4.0000 mg | ORAL_TABLET | Freq: Once | ORAL | Status: AC
  Administered 2018-01-30: 4 mg via ORAL
  Filled 2018-01-30: qty 1

## 2018-01-30 MED ORDER — AZITHROMYCIN 250 MG PO TABS
1000.0000 mg | ORAL_TABLET | Freq: Once | ORAL | Status: AC
  Administered 2018-01-30: 1000 mg via ORAL
  Filled 2018-01-30: qty 4

## 2018-01-30 MED ORDER — LIDOCAINE HCL (PF) 1 % IJ SOLN
INTRAMUSCULAR | Status: AC
  Administered 2018-01-30: 0.9 mL
  Filled 2018-01-30: qty 5

## 2018-01-30 MED ORDER — CEFTRIAXONE SODIUM 250 MG IJ SOLR
250.0000 mg | Freq: Once | INTRAMUSCULAR | Status: AC
  Administered 2018-01-30: 250 mg via INTRAMUSCULAR
  Filled 2018-01-30: qty 250

## 2018-01-30 NOTE — ED Provider Notes (Signed)
[facility] EMERGENCY [facility] Provider Note   CSN: [id number] Arrival date & time: 01/30/18  1300     History   Chief Complaint Chief Complaint  Patient presents with  . Penis Pain    HPI Gary [name] Pain is a 32 y.o. male.  HPI   Ms. Gary [name] is a 32 year old male with a history of HIV (CD4 count 422) who presents to the emergency department for evaluation of dysuria and penile discharge.  Patient reports that his symptoms started about 5 days ago.  Gary [name] reports a burning sensation whenever Gary [name] urinates and has to void more often.  Also states that there is a white/green discharge from the penis.  Gary [name] reports that Gary [name] is sexually active with male partners only.  Denies regular condom use, states that Gary [name] has had about 3 new sexual partners in the past 6 months.  Gary [name] denies fevers, chills, abdominal pain, nausea/vomiting, painful bowel movements, testicular pain, testicular swelling, genital lesion.  Past Medical History:  Diagnosis Date  . HIV (human immunodeficiency virus infection) [facility](HCC)     Patient Active Problem List   Diagnosis Date Noted  . Perirectal abscess 09/24/2016    Past Surgical History:  Procedure Laterality Date  . INCISION AND DRAINAGE PERIRECTAL ABSCESS N/A 09/24/2016   Procedure: IRRIGATION AND DEBRIDEMENT PERIRECTAL ABSCESS;  Surgeon: Ricarda Frameharles Woodham, MD;  Location: [facility] ORS;  Service: General;  Laterality: N/A;  . none          Home Medications    Prior to Admission medications   Medication Sig Start Date End Date Taking? Authorizing Provider  amoxicillin-clavulanate (AUGMENTIN) 875-125 MG tablet Take 1 tablet by mouth every 12 (twelve) hours. 09/25/16   Ricarda FrameWoodham, Charles, MD  chlordiazePOXIDE (LIBRIUM) 10 MG capsule Day 1-2 Take 1 tablet PO TID Day 3-4 Take 1 tablet PO BID Day 5-6 Take 1 tablet PO QD Day 7 STOP 09/16/16   Minna AntisPaduchowski, Kevin, MD  cyclobenzaprine (FLEXERIL) 10 MG tablet Take 1 tablet (10 mg total) by mouth 3 (three)  times daily as needed for muscle spasms. 10/01/16   Governor RooksLord, Rebecca, MD  doxycycline (VIBRAMYCIN) 50 MG capsule Take 2 capsules (100 mg total) by mouth 2 (two) times daily. 10/01/16   Governor RooksLord, Rebecca, MD  GENVOYA 150-150-200-10 MG TABS tablet Take 1 tablet by mouth daily. 06/30/16   [provider]  ondansetron (ZOFRAN ODT) 4 MG disintegrating tablet Take 1 tablet (4 mg total) by mouth every 8 (eight) hours as needed for nausea or vomiting. 07/20/17   Ward, Layla MawKristen N, DO  oxyCODONE (OXY IR/ROXICODONE) 5 MG immediate release tablet Take 1 tablet (5 mg total) by mouth every 4 (four) hours as needed for moderate pain or severe pain. 09/25/16   Ricarda FrameWoodham, Charles, MD  oxyCODONE-acetaminophen (PERCOCET/ROXICET) 5-325 MG tablet Take 1-2 tablets by mouth every 6 (six) hours as needed. 07/20/17   Ward, Layla MawKristen N, DO  pramoxine-hydrocortisone cream Apply topically 3 (three) times daily. 09/18/16   Tommi RumpsSummers, Rhonda L, PA-C  sulfamethoxazole-trimethoprim (BACTRIM DS,SEPTRA DS) 800-160 MG tablet Take 1 tablet by mouth 2 (two) times daily. 10/04/16   Minna AntisPaduchowski, Kevin, MD    Family History No family history on file.  Social History Social History   Tobacco Use  . Smoking status: Current Every Day Smoker    Packs/day: 0.50    Types: Cigarettes  . Smokeless tobacco: Never Used  Substance Use Topics  . Alcohol use: Yes    Comment: 18pk/day  . Drug use: Yes  Types: Marijuana     Allergies   Patient has no known allergies.   Review of Systems Review of Systems  Constitutional: Negative for chills and fever.  Gastrointestinal: Negative for abdominal pain, diarrhea, nausea and vomiting.  Genitourinary: Positive for discharge, dysuria and frequency. Negative for difficulty urinating, flank pain, hematuria, penile swelling, scrotal swelling and testicular pain.  Musculoskeletal: Negative for back pain.     Physical Exam Updated Vital Signs BP 99/73 (BP Location: Right Arm)   Pulse 98   Temp  98.5 F (36.9 C) (Oral)   Resp 16   Ht 6\' 1"  (1.854 m)   Wt 68 kg (150 lb)   SpO2 100%   BMI 19.79 kg/m   Physical Exam  Constitutional: Gary [name] is oriented to person, place, and time. Gary [name] appears well-developed and well-nourished. No distress.  HENT:  Head: Normocephalic and atraumatic.  Eyes: Right eye exhibits no discharge. Left eye exhibits no discharge.  Pulmonary/Chest: Effort normal. No respiratory distress.  Abdominal: Soft. Bowel sounds are normal. There is no tenderness. There is no guarding.  No CVA tenderness.  Genitourinary:  Genitourinary Comments: Chaperone present for exam.  White discharge expressed from penis. No signs of lesion or erythema on the penis or testicles. The penis and testicles are nontender. No testicular masses or swelling. No signs of any inguinal hernias.  Neurological: Gary [name] is alert and oriented to person, place, and time. Coordination normal.  Skin: Skin is warm and dry. Gary [name] is not diaphoretic.  Psychiatric: Gary [name] has a normal mood and affect. His behavior is normal.  Nursing note and vitals reviewed.    ED Treatments / Results  Labs (all labs ordered are listed, but only abnormal results are displayed) Labs Reviewed  URINALYSIS, ROUTINE W REFLEX MICROSCOPIC - Abnormal; Notable for the following components:      Result Value   Leukocytes, UA MODERATE (*)    All other components within normal limits  URINE CULTURE  RPR  GC/CHLAMYDIA PROBE AMP ([place]) NOT AT [facility]    EKG None  Radiology No results found.  Procedures Procedures (including critical care time)  Medications Ordered in ED Medications  cefTRIAXone (ROCEPHIN) injection 250 mg (250 mg Intramuscular Given 01/30/18 1540)  azithromycin (ZITHROMAX) tablet 1,000 mg (1,000 mg Oral Given 01/30/18 1540)  ondansetron (ZOFRAN-ODT) disintegrating tablet 4 mg (4 mg Oral Given 01/30/18 1540)  lidocaine (PF) (XYLOCAINE) 1 % injection (0.9 mLs  Given 01/30/18 1541)     Initial Impression /  Assessment and Plan / ED Course  I have reviewed the triage vital signs and the nursing notes.  Pertinent labs & imaging results that were available during my care of the patient were reviewed by me and considered in my medical decision making (see chart for details).     Patient is afebrile without abdominal tenderness, abdominal pain or painful bowel movements to indicate prostatitis.  No tenderness to palpation of the testes or epididymis to suggest orchitis or epididymitis. UA reveals TNTC WBCs, urine culture in process. STD cultures obtained including syphilis, gonorrhea and chlamydia. Given age and risk factors including unprotected sex, patient's UTI likely due to GC/Chlamydia. Patient treated prophylactically with azithromycin and Rocephin.  Discussed importance of using protection when sexually active. Pt understands that Gary [name] has GC/Chlamydia cultures pending and will need to inform all sexual partners if results return positive.  Discussed return precautions and patient agrees and voiced understanding to the above plan and appears reliable to follow-up.  Final Clinical Impressions(s) / ED  Diagnoses   Final diagnoses:  Penile discharge    ED Discharge Orders    None       Lawrence Marseilles 01/31/18 1610    Arby Barrette, MD 01/31/18 1622

## 2018-01-30 NOTE — Discharge Instructions (Signed)
You were treated for chlamydia and gonorrhea today in the emergency department.   Your urine was sent for culture.  Your results were return in the next few days.  Please follow-up with your regular doctor if your symptoms are not improving in the next 72 hours.  Return to the emergency department if you have fever greater than 100.4 F, abdominal pain, testicular pain or swelling or vomiting that does not stop.

## 2018-01-30 NOTE — ED Triage Notes (Signed)
Patient complains of 3 days of penile discharge with mild dysuria. Patient states that he has unprotected sex and thinks he may have STD

## 2018-01-31 LAB — URINE CULTURE: Culture: NO GROWTH

## 2018-01-31 LAB — GC/CHLAMYDIA PROBE AMP (~~LOC~~) NOT AT ARMC
Chlamydia: NEGATIVE
Neisseria Gonorrhea: POSITIVE — AB

## 2018-02-01 LAB — RPR: RPR Ser Ql: NONREACTIVE

## 2018-06-15 ENCOUNTER — Encounter: Payer: Self-pay | Admitting: Emergency Medicine

## 2018-06-15 ENCOUNTER — Inpatient Hospital Stay
Admission: EM | Admit: 2018-06-15 | Discharge: 2018-06-19 | DRG: 975 | Disposition: A | Discharge status: Home / Self Care | Payer: Self-pay | Attending: Internal Medicine | Admitting: Internal Medicine

## 2018-06-15 ENCOUNTER — Other Ambulatory Visit: Payer: Self-pay

## 2018-06-15 DIAGNOSIS — E871 Hypo-osmolality and hyponatremia: Secondary | ICD-10-CM | POA: Diagnosis present

## 2018-06-15 DIAGNOSIS — Z7151 Drug abuse counseling and surveillance of drug abuser: Secondary | ICD-10-CM

## 2018-06-15 DIAGNOSIS — Z91048 Other nonmedicinal substance allergy status: Secondary | ICD-10-CM

## 2018-06-15 DIAGNOSIS — Z21 Asymptomatic human immunodeficiency virus [HIV] infection status: Secondary | ICD-10-CM | POA: Diagnosis present

## 2018-06-15 DIAGNOSIS — F1590 Other stimulant use, unspecified, uncomplicated: Secondary | ICD-10-CM | POA: Diagnosis present

## 2018-06-15 DIAGNOSIS — E860 Dehydration: Secondary | ICD-10-CM | POA: Diagnosis present

## 2018-06-15 DIAGNOSIS — F17210 Nicotine dependence, cigarettes, uncomplicated: Secondary | ICD-10-CM | POA: Diagnosis present

## 2018-06-15 DIAGNOSIS — Z811 Family history of alcohol abuse and dependence: Secondary | ICD-10-CM

## 2018-06-15 DIAGNOSIS — N4822 Cellulitis of corpus cavernosum and penis: Secondary | ICD-10-CM | POA: Diagnosis present

## 2018-06-15 DIAGNOSIS — A419 Sepsis, unspecified organism: Principal | ICD-10-CM | POA: Diagnosis present

## 2018-06-15 DIAGNOSIS — B20 Human immunodeficiency virus [HIV] disease: Secondary | ICD-10-CM | POA: Diagnosis present

## 2018-06-15 DIAGNOSIS — F1910 Other psychoactive substance abuse, uncomplicated: Secondary | ICD-10-CM | POA: Diagnosis present

## 2018-06-15 DIAGNOSIS — Z7289 Other problems related to lifestyle: Secondary | ICD-10-CM

## 2018-06-15 LAB — BASIC METABOLIC PANEL
Anion gap: 13 (ref 5–15)
BUN: 9 mg/dL (ref 6–20)
CO2: 22 mmol/L (ref 22–32)
Calcium: 9.2 mg/dL (ref 8.9–10.3)
Chloride: 93 mmol/L — ABNORMAL LOW (ref 98–111)
Creatinine, Ser: 0.79 mg/dL (ref 0.61–1.24)
GFR calc Af Amer: >60 mL/min (ref >60)
GFR calc non Af Amer: >60 mL/min (ref >60)
Glucose, Bld: 82 mg/dL (ref 70–99)
Potassium: 4 mmol/L (ref 3.5–5.1)
Sodium: 128 mmol/L — ABNORMAL LOW (ref 135–145)

## 2018-06-15 LAB — URINALYSIS, COMPLETE (UACMP) WITH MICROSCOPIC
Bacteria, UA: NONE SEEN
Bilirubin Urine: NEGATIVE
Glucose, UA: NEGATIVE mg/dL
Hgb urine dipstick: NEGATIVE
Ketones, ur: 80 mg/dL — AB
Leukocytes, UA: NEGATIVE
Nitrite: NEGATIVE
Protein, ur: NEGATIVE mg/dL
Specific Gravity, Urine: 1.011 (ref 1.005–1.030)
Squamous Epithelial / LPF: NONE SEEN (ref 0–5)
pH: 6 (ref 5.0–8.0)

## 2018-06-15 LAB — CBC WITH DIFFERENTIAL/PLATELET
Basophils Absolute: 0 10*3/uL (ref 0–0.1)
Basophils Relative: 0 %
Eosinophils Absolute: 0 10*3/uL (ref 0–0.7)
Eosinophils Relative: 0 %
HCT: 39.7 % — ABNORMAL LOW (ref 40.0–52.0)
Hemoglobin: 13.8 g/dL (ref 13.0–18.0)
Lymphocytes Relative: 4 %
Lymphs Abs: 0.5 10*3/uL — ABNORMAL LOW (ref 1.0–3.6)
MCH: 31.8 pg (ref 26.0–34.0)
MCHC: 34.9 g/dL (ref 32.0–36.0)
MCV: 91.3 fL (ref 80.0–100.0)
Monocytes Absolute: 0.9 10*3/uL (ref 0.2–1.0)
Monocytes Relative: 7 %
Neutro Abs: 11 10*3/uL — ABNORMAL HIGH (ref 1.4–6.5)
Neutrophils Relative %: 89 %
Platelets: 229 10*3/uL (ref 150–440)
RBC: 4.35 MIL/uL — ABNORMAL LOW (ref 4.40–5.90)
RDW: 13.1 % (ref 11.5–14.5)
WBC: 12.5 10*3/uL — ABNORMAL HIGH (ref 3.8–10.6)

## 2018-06-15 LAB — LACTIC ACID, PLASMA: Lactic Acid, Venous: 0.8 mmol/L (ref 0.5–1.9)

## 2018-06-15 MED ORDER — ONDANSETRON HCL 4 MG/2ML IJ SOLN: 4.0000 mg | Freq: Four times a day (QID) | INTRAMUSCULAR | Status: DC | PRN

## 2018-06-15 MED ORDER — KETOROLAC TROMETHAMINE 30 MG/ML IJ SOLN
30.0000 mg | Freq: Once | INTRAMUSCULAR | Status: AC
  Administered 2018-06-15: 30 mg via INTRAVENOUS
  Filled 2018-06-15: qty 1

## 2018-06-15 MED ORDER — VANCOMYCIN HCL IN DEXTROSE 1-5 GM/200ML-% IV SOLN
1000.0000 mg | Freq: Once | INTRAVENOUS | Status: AC
  Administered 2018-06-15: 1000 mg via INTRAVENOUS

## 2018-06-15 MED ORDER — SODIUM CHLORIDE 0.9 % IV BOLUS
1000.0000 mL | Freq: Once | INTRAVENOUS | Status: AC
  Administered 2018-06-15: 1000 mL via INTRAVENOUS

## 2018-06-15 MED ORDER — NICOTINE 21 MG/24HR TD PT24
21.0000 mg | MEDICATED_PATCH | Freq: Every day | TRANSDERMAL | Status: DC
  Administered 2018-06-18: 21 mg via TRANSDERMAL
  Filled 2018-06-15 (×3): qty 1

## 2018-06-15 MED ORDER — ACETAMINOPHEN 325 MG PO TABS
650.0000 mg | ORAL_TABLET | Freq: Four times a day (QID) | ORAL | Status: DC | PRN
  Administered 2018-06-18: 650 mg via ORAL
  Filled 2018-06-15: qty 2

## 2018-06-15 MED ORDER — SODIUM CHLORIDE 0.9 % IV SOLN
3.0000 g | Freq: Four times a day (QID) | INTRAVENOUS | Status: DC
  Administered 2018-06-15 – 2018-06-19 (×13): 3 g via INTRAVENOUS
  Filled 2018-06-15 (×18): qty 3

## 2018-06-15 MED ORDER — MORPHINE SULFATE (PF) 2 MG/ML IV SOLN
2.0000 mg | INTRAVENOUS | Status: DC | PRN
  Administered 2018-06-15 – 2018-06-18 (×8): 2 mg via INTRAVENOUS
  Filled 2018-06-15 (×8): qty 1

## 2018-06-15 MED ORDER — FOLIC ACID 1 MG PO TABS
1.0000 mg | ORAL_TABLET | Freq: Every day | ORAL | Status: DC
  Administered 2018-06-16 – 2018-06-18 (×3): 1 mg via ORAL
  Filled 2018-06-15 (×3): qty 1

## 2018-06-15 MED ORDER — SODIUM CHLORIDE 0.9 % IV SOLN
3.0000 g | Freq: Once | INTRAVENOUS | Status: AC
  Administered 2018-06-15: 3 g via INTRAVENOUS
  Filled 2018-06-15: qty 3

## 2018-06-15 MED ORDER — VITAMIN B-1 100 MG PO TABS
100.0000 mg | ORAL_TABLET | Freq: Every day | ORAL | Status: DC
  Administered 2018-06-17 – 2018-06-18 (×2): 100 mg via ORAL
  Filled 2018-06-15 (×2): qty 1

## 2018-06-15 MED ORDER — LORAZEPAM 1 MG PO TABS: 1.0000 mg | ORAL_TABLET | Freq: Four times a day (QID) | ORAL | Status: AC | PRN

## 2018-06-15 MED ORDER — LORAZEPAM 2 MG PO TABS: 0.0000 mg | ORAL_TABLET | Freq: Two times a day (BID) | ORAL | Status: DC

## 2018-06-15 MED ORDER — HYDROMORPHONE HCL 1 MG/ML IJ SOLN
1.0000 mg | Freq: Once | INTRAMUSCULAR | Status: AC
  Administered 2018-06-15: 1 mg via INTRAVENOUS
  Filled 2018-06-15: qty 1

## 2018-06-15 MED ORDER — LORAZEPAM 2 MG PO TABS: 0.0000 mg | ORAL_TABLET | Freq: Four times a day (QID) | ORAL | Status: AC

## 2018-06-15 MED ORDER — ENOXAPARIN SODIUM 40 MG/0.4ML ~~LOC~~ SOLN
40.0000 mg | SUBCUTANEOUS | Status: DC
  Administered 2018-06-15: 40 mg via SUBCUTANEOUS
  Filled 2018-06-15 (×3): qty 0.4

## 2018-06-15 MED ORDER — SODIUM CHLORIDE 0.9 % IV SOLN
INTRAVENOUS | Status: DC
  Administered 2018-06-15 – 2018-06-18 (×6): via INTRAVENOUS

## 2018-06-15 MED ORDER — LORAZEPAM 2 MG/ML IJ SOLN: 1.0000 mg | Freq: Four times a day (QID) | INTRAMUSCULAR | Status: AC | PRN

## 2018-06-15 MED ORDER — OXYCODONE HCL 5 MG PO TABS
5.0000 mg | ORAL_TABLET | ORAL | Status: DC | PRN
  Administered 2018-06-16 – 2018-06-19 (×14): 5 mg via ORAL
  Filled 2018-06-15 (×14): qty 1

## 2018-06-15 MED ORDER — MORPHINE SULFATE (PF) 4 MG/ML IV SOLN
4.0000 mg | Freq: Once | INTRAVENOUS | Status: AC
  Administered 2018-06-15: 4 mg via INTRAVENOUS
  Filled 2018-06-15: qty 1

## 2018-06-15 MED ORDER — ONDANSETRON HCL 4 MG PO TABS: 4.0000 mg | ORAL_TABLET | Freq: Four times a day (QID) | ORAL | Status: DC | PRN

## 2018-06-15 MED ORDER — ACETAMINOPHEN 650 MG RE SUPP: 650.0000 mg | Freq: Four times a day (QID) | RECTAL | Status: DC | PRN

## 2018-06-15 MED ORDER — VANCOMYCIN HCL IN DEXTROSE 1-5 GM/200ML-% IV SOLN
1000.0000 mg | Freq: Three times a day (TID) | INTRAVENOUS | Status: DC
  Administered 2018-06-16 – 2018-06-17 (×4): 1000 mg via INTRAVENOUS
  Filled 2018-06-15 (×9): qty 200

## 2018-06-15 MED ORDER — THIAMINE HCL 100 MG/ML IJ SOLN: 100.0000 mg | Freq: Every day | INTRAMUSCULAR | Status: DC

## 2018-06-15 MED ORDER — ADULT MULTIVITAMIN W/MINERALS CH
1.0000 | ORAL_TABLET | Freq: Every day | ORAL | Status: DC
  Administered 2018-06-16 – 2018-06-18 (×3): 1 via ORAL
  Filled 2018-06-15 (×3): qty 1

## 2018-06-15 NOTE — ED Triage Notes (Signed)
Pt in via EMS with c/o infection or reaction to his penis. Pt reports to EMS that he used a lotion last night to have a good time and now his penis is burning. Pt admits to meth use over the lat 24 hours and is HIV positive and not taking meds for the last year. BP 121 78

## 2018-06-15 NOTE — ED Notes (Signed)
ED Provider at bedside. 

## 2018-06-15 NOTE — Progress Notes (Addendum)
Pharmacy Antibiotic Note  Gary [name] is a 32 y.o. male admitted on 06/15/2018 with cellulitis.  Pharmacy has been consulted for Unasyn and vancomycin dosing.  Plan: Vancomycin 1000mg  IV every 8 hours starting 6 hours after first dose Ke: 0.111, T1/2: 6h, Vd: 48L, calculated steady-state levels: 35.3/16.2 mcg/mL Goal Vt 15-20 mcg/mL, Vt prior to 4th dose Unasyn 3 grams IV every 6 hours  Height: 6\' 1"  (185.4 cm) Weight: 150 lb (68 kg) IBW/kg (Calculated) : 79.9  Temp (24hrs), Avg:102 F (38.9 C), Min:102 F (38.9 C), Max:102 F (38.9 C)  Recent Labs  Lab 06/15/18 1405  WBC 12.5*  CREATININE 0.79  LATICACIDVEN 0.8    Estimated Creatinine Clearance: 128.7 mL/min (by C-G formula based on SCr of 0.79 mg/dL).    Allergies  Allergen Reactions  . Caffeine Palpitations    Antimicrobials this admission: Unasyn 8/7 >>  Vancomycin 8/7>> Microbiology results: 8/7 BCx: pending 8/7 UCx: pending  Thank you for allowing pharmacy to be a part of this patient's care.  Lowella Bandyodney D Grubb, PharmD 06/15/2018 5:29 PM

## 2018-06-15 NOTE — H&P (Signed)
[employer] [facility] [facility] [place] [facility]   PATIENT NAME: Gary [name]    MR#:  [id number]  DATE OF BIRTH:  Jan 24, 1986  DATE OF ADMISSION:  06/15/2018  PRIMARY CARE PHYSICIAN: Patient, No Pcp Per   REQUESTING/REFERRING PHYSICIAN: Dr. Dionne Bucy  CHIEF COMPLAINT:   Chief Complaint  Patient presents with  . Groin Swelling    HISTORY OF PRESENT ILLNESS:  Gary [name]  is a 32 y.o. male with a known history of HIV not on treatment currently, tobacco use disorder, alcohol abuse and recreational drug use presents to hospital secondary to fevers, worsening pain and swelling of his penis. Patient has been using methamphetamine and also benzos over the last few days.  He says he was masturbating rigorously yesterday and noticed some burning and pain in his penile area.  He started applying lotion and also his own saliva to lubricate it.  Late last night, he was having pain on urination and noted that his penis was significantly swollen and red.  He was also running fevers and so presented to the emergency room.  Noted to be septic with high fevers and also elevated white count.  PAST MEDICAL HISTORY:   Past Medical History:  Diagnosis Date  . HIV (human immunodeficiency virus infection) (HCC)     PAST SURGICAL HISTORY:   Past Surgical History:  Procedure Laterality Date  . INCISION AND DRAINAGE PERIRECTAL ABSCESS N/A 09/24/2016   Procedure: IRRIGATION AND DEBRIDEMENT PERIRECTAL ABSCESS;  Surgeon: Ricarda Frame, MD;  Location: [facility] ORS;  Service: General;  Laterality: N/A;  . none      SOCIAL HISTORY:   Social History   Tobacco Use  . Smoking status: Current Every Day Smoker    Packs/day: 0.50    Types: Cigarettes  . Smokeless tobacco: Never Used  Substance Use Topics  . Alcohol use: Yes    Comment: 18pk/day    FAMILY HISTORY:   Family History  Problem Relation Age of Onset  . Alcohol abuse Mother   . Alcohol abuse Father     DRUG ALLERGIES:    Allergies  Allergen Reactions  . Caffeine Palpitations    REVIEW OF SYSTEMS:   Review of Systems  Constitutional: Positive for chills and fever. Negative for malaise/fatigue and weight loss.  HENT: Negative for ear discharge, ear pain, hearing loss and nosebleeds.   Eyes: Negative for blurred vision, double vision and photophobia.  Respiratory: Negative for cough, hemoptysis, shortness of breath and wheezing.   Cardiovascular: Negative for chest pain, palpitations, orthopnea and leg swelling.  Gastrointestinal: Negative for abdominal pain, constipation, diarrhea, heartburn, melena, nausea and vomiting.  Genitourinary: Positive for dysuria. Negative for frequency, hematuria and urgency.       Penile swelling  Musculoskeletal: Positive for myalgias. Negative for back pain and neck pain.  Skin: Negative for rash.  Neurological: Negative for dizziness, tingling, tremors, sensory change, speech change, focal weakness and headaches.  Endo/Heme/Allergies: Does not bruise/bleed easily.  Psychiatric/Behavioral: Negative for depression.    MEDICATIONS AT HOME:   Prior to Admission medications   Not on File      VITAL SIGNS:  Blood pressure 111/64, pulse (!) 103, temperature (!) 102 F (38.9 C), temperature source Oral, resp. rate 16, height 6\' 1"  (1.854 m), weight 68 kg (150 lb), SpO2 96 %.  PHYSICAL EXAMINATION:   Physical Exam  GENERAL:  32 y.o.-year-old patient lying in the bed with no acute distress.  EYES: Pupils equal, round, reactive to light and accommodation.  No scleral icterus. Extraocular muscles intact.  HEENT: Head atraumatic, normocephalic. Oropharynx and nasopharynx clear.  NECK:  Supple, no jugular venous distention. No thyroid enlargement, no tenderness.  LUNGS: Normal breath sounds bilaterally, no wheezing, rales,rhonchi or crepitation. No use of accessory muscles of respiration.  CARDIOVASCULAR: S1, S2 normal. No murmurs, rubs, or gallops.  ABDOMEN: Soft,  nontender, nondistended. Bowel sounds present. No organomegaly or mass.  GENITOURINARY-significant swelling of the penis noted, urethral opening appears normal.  Extensive redness and tenderness noted as well.  No open sores, couple of purulent spots noted on the dorsum of penis. EXTREMITIES: No pedal edema, cyanosis, or clubbing.  NEUROLOGIC: Cranial nerves II through XII are intact. Muscle strength 5/5 in all extremities. Sensation intact. Gait not checked. Tremors of hands noted.  PSYCHIATRIC: The patient is alert and oriented x 3.  SKIN: No obvious rash, lesion, or ulcer.   LABORATORY PANEL:   CBC Recent Labs  Lab 06/15/18 1405  WBC 12.5*  HGB 13.8  HCT 39.7*  PLT 229   ------------------------------------------------------------------------------------------------------------------  Chemistries  Recent Labs  Lab 06/15/18 1405  NA 128*  K 4.0  CL 93*  CO2 22  GLUCOSE 82  BUN 9  CREATININE 0.79  CALCIUM 9.2   ------------------------------------------------------------------------------------------------------------------  Cardiac Enzymes No results for input(s): TROPONINI in the last 168 hours. ------------------------------------------------------------------------------------------------------------------  RADIOLOGY:  No results found.  EKG:   Orders placed or performed during the hospital encounter of 10/04/16  . ED EKG  . ED EKG    IMPRESSION AND PLAN:   Arvid Rightony Brissette  is a 32 y.o. male with a known history of HIV not on treatment currently, tobacco use disorder, alcohol abuse and recreational drug use presents to hospital secondary to fevers, worsening pain and swelling of his penis.  1.  Sepsis-secondary to penile cellulitis from masturbating -Admit, blood cultures. -Started on vancomycin and Unasyn.  Urology consult-notified from ER. -IV fluids and monitor  2.  Hyponatremia-secondary to dehydration.  Monitor with IV fluids  3.  History of  HIV-not on treatment for almost an year.  Not following with ID currently.  Check CD4 count and HIV RNA levels.  Will need outpatient ID follow-up at discharge.  4.  Polysubstance abuse-counseled, nicotine patch for smoking and also placed on CIWA protocol for alcohol use.  5.  DVT prophylaxis-Lovenox    All the records are reviewed and case discussed with ED provider. Management plans discussed with the patient, family and they are in agreement.  CODE STATUS:  Full Code  TOTAL TIME TAKING CARE OF THIS PATIENT: 55 minutes.    Enid BaasKALISETTI,Aalaya Yadao M.D on 06/15/2018 at 5:26 PM  Between 7am to 6pm - Pager - [phone]  After 6pm go to [email or web address] - password [email or web address]  [facility] [place] [facility]  Office  [phone]  CC: Primary care physician; Patient, No Pcp Per

## 2018-06-15 NOTE — ED Provider Notes (Signed)
[facility] Emergency Department Provider Note ____________________________________________   First MD Initiated Contact with Patient 06/15/18 1338     (approximate)  I have reviewed the triage vital signs and the nursing notes.   HISTORY  Chief Complaint Groin Swelling    HPI Gary [name] is a 32 y.o. male with PMH as noted below who presents with swelling to the penis over the last day, gradual onset, worsening, and associated with significant pain.  The patient states that he was recently using methamphetamine and was masturbating repeatedly and very intensely, using saliva as lubrication.  He states that he initially developed a sore near the tip of the penis due to the masturbation, and then the penis became red and began to swell since last night.  He denies any prior history of this.  He denies any other penile trauma.  He states that he is able to urinate but it is painful.  He reports generalized fever but no other acute symptoms.   Past Medical History:  Diagnosis Date  . HIV (human immunodeficiency virus infection) [facility](HCC)     Patient Active Problem List   Diagnosis Date Noted  . Perirectal abscess 09/24/2016    Past Surgical History:  Procedure Laterality Date  . INCISION AND DRAINAGE PERIRECTAL ABSCESS N/A 09/24/2016   Procedure: IRRIGATION AND DEBRIDEMENT PERIRECTAL ABSCESS;  Surgeon: Ricarda Frameharles Woodham, MD;  Location: [facility] ORS;  Service: General;  Laterality: N/A;  . none      Prior to Admission medications   Not on File    Allergies Caffeine  Family History  Problem Relation Age of Onset  . Alcohol abuse Mother   . Alcohol abuse Father     Social History Social History   Tobacco Use  . Smoking status: Current Every Day Smoker    Packs/day: 0.50    Types: Cigarettes  . Smokeless tobacco: Never Used  Substance Use Topics  . Alcohol use: Yes    Comment: 18pk/day  . Drug use: Yes    Types: Marijuana, Methamphetamines     Review of Systems  Constitutional: Positive for fever. Eyes: No redness. ENT: No sore throat. Cardiovascular: Denies chest pain. Respiratory: Denies shortness of breath. Gastrointestinal: No vomiting.  Genitourinary: Positive for penile swelling. Musculoskeletal: Negative for back pain. Skin: Negative for rash. Neurological: Negative for headache.   ____________________________________________   PHYSICAL EXAM:  VITAL SIGNS: ED Triage Vitals  Enc Vitals Group     BP 06/15/18 1312 122/73     Pulse Rate 06/15/18 1312 (!) 114     Resp 06/15/18 1312 (!) 24     Temp 06/15/18 1312 (!) 102 F (38.9 C)     Temp Source 06/15/18 1312 Oral     SpO2 06/15/18 1312 98 %     Weight 06/15/18 1313 150 lb (68 kg)     Height 06/15/18 1313 6\' 1"  (1.854 m)     Head Circumference --      Peak Flow --      Pain Score 06/15/18 1313 10     Pain Loc --      Pain Edu? --      Excl. in GC? --     Constitutional: Alert and oriented.  Uncomfortable appearing. Eyes: Conjunctivae are normal.  Head: Atraumatic. Nose: No congestion/rhinnorhea. Mouth/Throat: Mucous membranes are moist.   Neck: Normal range of motion.  Cardiovascular: Tachycardic, regular rhythm. Good peripheral circulation. Respiratory: Normal respiratory effort.  No retractions. Gastrointestinal: Soft and nontender. No distention.  Genitourinary: Diffuse swelling to the shaft of the penis with erythema and induration.  Some raw areas to the distal foreskin area but no open wounds. Musculoskeletal: Extremities warm and well perfused.  Neurologic:  Normal speech and language. No gross focal neurologic deficits are appreciated.  Skin:  Skin is warm and dry. No rash noted. Psychiatric: Anxious appearing.  ____________________________________________   LABS (all labs ordered are listed, but only abnormal results are displayed)  Labs Reviewed  BASIC METABOLIC PANEL - Abnormal; Notable for the following components:       Result Value   Sodium 128 (*)    Chloride 93 (*)    All other components within normal limits  CBC WITH DIFFERENTIAL/PLATELET - Abnormal; Notable for the following components:   WBC 12.5 (*)    RBC 4.35 (*)    HCT 39.7 (*)    Neutro Abs 11.0 (*)    Lymphs Abs 0.5 (*)    All other components within normal limits  URINE CULTURE  CHLAMYDIA/NGC RT PCR ([facility] ONLY)  CULTURE, BLOOD (ROUTINE X 2)  CULTURE, BLOOD (ROUTINE X 2)  LACTIC ACID, PLASMA  URINALYSIS, COMPLETE (UACMP) WITH MICROSCOPIC  LACTIC ACID, PLASMA  RPR  RPR   ____________________________________________  EKG   ____________________________________________  RADIOLOGY    ____________________________________________   PROCEDURES  Procedure(s) performed: No  Procedures  Critical Care performed: No ____________________________________________   INITIAL IMPRESSION / ASSESSMENT AND PLAN / ED COURSE  Pertinent labs & imaging results that were available during my care of the patient were reviewed by me and considered in my medical decision making (see chart for details).  32 year old male with PMH as noted above including HIV and methamphetamine use presents with penile swelling over the last day, after the patient continuously masturbated for many hours during methamphetamine use.  On exam, the patient is uncomfortable appearing, he is tachycardic and febrile, and the remainder of the exam is as described above.  There is significant swelling to the skin of the shaft of the penis although no discharge, no open wounds, and no scrotal swelling.  Overall presentation is consistent with cellulitis of the penis, likely precipitated by masturbation and raw skin.  Given that the patient used his own saliva as a lubricant, I will give Unasyn to treat for intraoral bacteria, analgesia, obtain labs and sepsis work-up, and reassess.  ----------------------------------------- 4:44 PM on  06/15/2018 -----------------------------------------  I consulted Dr. Lonna Cobb from urology who agreed with the plan to treat with Unasyn.  On reassessment the patient has improved but still significant pain.  He is still tachycardic although some of this may be related to the methamphetamine and dehydration.  Given the patient's abnormal vitals, elevated WC count, fever, and relatively severe pain, I believe that he would benefit from admission for IV antibiotics and analgesia.  Patient agrees with this plan.  I signed the patient out to the hospitalist Dr. Nemiah Commander.   ____________________________________________   FINAL CLINICAL IMPRESSION(S) / ED DIAGNOSES  Final diagnoses:  Penile cellulitis      NEW MEDICATIONS STARTED DURING THIS VISIT:  New Prescriptions   No medications on file     Note:  This document was prepared using Dragon voice recognition software and may include unintentional dictation errors.    Dionne Bucy, MD 06/15/18 [id number]

## 2018-06-16 DIAGNOSIS — F1910 Other psychoactive substance abuse, uncomplicated: Secondary | ICD-10-CM | POA: Diagnosis present

## 2018-06-16 DIAGNOSIS — F17210 Nicotine dependence, cigarettes, uncomplicated: Secondary | ICD-10-CM | POA: Diagnosis present

## 2018-06-16 DIAGNOSIS — Z21 Asymptomatic human immunodeficiency virus [HIV] infection status: Secondary | ICD-10-CM | POA: Diagnosis present

## 2018-06-16 DIAGNOSIS — B20 Human immunodeficiency virus [HIV] disease: Secondary | ICD-10-CM | POA: Diagnosis present

## 2018-06-16 DIAGNOSIS — N4822 Cellulitis of corpus cavernosum and penis: Secondary | ICD-10-CM

## 2018-06-16 LAB — BASIC METABOLIC PANEL
Anion gap: 7 (ref 5–15)
BUN: 12 mg/dL (ref 6–20)
CO2: 25 mmol/L (ref 22–32)
Calcium: 8.1 mg/dL — ABNORMAL LOW (ref 8.9–10.3)
Chloride: 99 mmol/L (ref 98–111)
Creatinine, Ser: 0.72 mg/dL (ref 0.61–1.24)
GFR calc Af Amer: >60 mL/min (ref >60)
GFR calc non Af Amer: >60 mL/min (ref >60)
Glucose, Bld: 161 mg/dL — ABNORMAL HIGH (ref 70–99)
Potassium: 3.4 mmol/L — ABNORMAL LOW (ref 3.5–5.1)
Sodium: 131 mmol/L — ABNORMAL LOW (ref 135–145)

## 2018-06-16 LAB — CHLAMYDIA/NGC RT PCR (ARMC ONLY)
Chlamydia Tr: NOT DETECTED
N gonorrhoeae: NOT DETECTED

## 2018-06-16 LAB — CBC
HCT: 34.9 % — ABNORMAL LOW (ref 40.0–52.0)
Hemoglobin: 12.4 g/dL — ABNORMAL LOW (ref 13.0–18.0)
MCH: 32.3 pg (ref 26.0–34.0)
MCHC: 35.4 g/dL (ref 32.0–36.0)
MCV: 91 fL (ref 80.0–100.0)
Platelets: 218 10*3/uL (ref 150–440)
RBC: 3.83 MIL/uL — ABNORMAL LOW (ref 4.40–5.90)
RDW: 13.1 % (ref 11.5–14.5)
WBC: 12.5 10*3/uL — ABNORMAL HIGH (ref 3.8–10.6)

## 2018-06-16 LAB — RPR: RPR Ser Ql: NONREACTIVE

## 2018-06-16 MED ORDER — POTASSIUM CHLORIDE CRYS ER 20 MEQ PO TBCR
40.0000 meq | EXTENDED_RELEASE_TABLET | Freq: Once | ORAL | Status: AC
  Administered 2018-06-16: 40 meq via ORAL
  Filled 2018-06-16: qty 2

## 2018-06-16 MED ORDER — ELVITEG-COBIC-EMTRICIT-TENOFAF 150-150-200-10 MG PO TABS
1.0000 | ORAL_TABLET | Freq: Every day | ORAL | Status: DC
  Administered 2018-06-17 – 2018-06-19 (×3): 1 via ORAL
  Filled 2018-06-16 (×3): qty 1

## 2018-06-16 NOTE — Consult Note (Signed)
Urology Consult  I have been asked to see the patient by Dr. Nemiah CommanderKalisetti, for evaluation and management of penile cellulitis.  Chief Complaint: Penile pain  History of Present Illness: Gary [name] is a 32 y.o. year old  who presented to the ED yesterday complaining of worsening penile pain and swelling.  He has a history of using methamphetamine and 2 days ago was masturbating vigorously using saliva as lubrication.  He had noted erosions on the distal penile shaft and yesterday began to have progressive pain and swelling.  Temp on presentation to the ED was 102 degrees.  He had no voiding complaints.  He is HIV positive.  He was started on Unasyn and vancomycin.  Overnight he states his pain has improved but thinks his swelling has increased.  Past Medical History:  Diagnosis Date  . HIV (human immunodeficiency virus infection) (HCC)     Past Surgical History:  Procedure Laterality Date  . INCISION AND DRAINAGE PERIRECTAL ABSCESS N/A 09/24/2016   Procedure: IRRIGATION AND DEBRIDEMENT PERIRECTAL ABSCESS;  Surgeon: Ricarda Frameharles Woodham, MD;  Location: [facility] ORS;  Service: General;  Laterality: N/A;  . none      Home Medications:  Current Meds  Medication Sig  . [DISCONTINUED] GENVOYA 150-150-200-10 MG TABS tablet Take 1 tablet by mouth daily.    Allergies:  Allergies  Allergen Reactions  . Caffeine Palpitations    Family History  Problem Relation Age of Onset  . Alcohol abuse Mother   . Alcohol abuse Father     Social History:  reports that he has been smoking cigarettes. He has been smoking about 0.50 packs per day. He has never used smokeless tobacco. He reports that he drinks alcohol. He reports that he has current or past drug history. Drugs: Marijuana and Methamphetamines.  ROS: A complete review of systems was performed.  All systems are negative except for pertinent findings as noted.  Physical Exam:  Vital signs in last 24 hours: Temp:  [99.9 F (37.7 C)-102  F (38.9 C)] 100.2 F (37.9 C) (08/08 0428) Pulse Rate:  [95-115] 95 (08/08 0428) Resp:  [16-24] 18 (08/08 0428) BP: (95-126)/(46-81) 95/46 (08/08 0428) SpO2:  [94 %-99 %] 97 % (08/08 0428) Weight:  [66.6 kg-68 kg] 66.6 kg (08/07 1857) Constitutional:  Alert and oriented, No acute distress HEENT: [place] AT, moist mucus membranes.  Trachea midline, no masses Cardiovascular: Regular rate and rhythm, no clubbing, cyanosis, or edema. Respiratory: Normal respiratory effort, lungs clear bilaterally GI: Abdomen is soft, nontender, nondistended, no abdominal masses GU: Penis with marked edema and mild erythema.  Bullous formation distal prepuce.  Urethra meatus normal in appearance.  Testes descended bilaterally without masses or tenderness.  No paratesticular abnormalities.  No scrotal erythema.  No CVA tenderness Neurologic: Grossly intact, no focal deficits, moving all 4 extremities Psychiatric: Normal mood and affect   Laboratory Data:  Recent Labs    06/15/18 1405 06/16/18 0424  WBC 12.5* 12.5*  HGB 13.8 12.4*  HCT 39.7* 34.9*   Recent Labs    06/15/18 1405 06/16/18 0424  NA 128* 131*  K 4.0 3.4*  CL 93* 99  CO2 22 25  GLUCOSE 82 161*  BUN 9 12  CREATININE 0.79 0.72  CALCIUM 9.2 8.1*    Results for orders placed or performed during the hospital encounter of 06/15/18  Chlamydia/NGC rt PCR ([facility] only)     Status: None   Collection Time: 06/15/18  2:05 PM  Result Value Ref Range  Status   Specimen source GC/Chlam CHLAMYDIA SPECIES  Final   Chlamydia Tr NOT DETECTED NOT DETECTED Final   N gonorrhoeae NOT DETECTED NOT DETECTED Final    Comment: (NOTE) This CT/NG assay has not been evaluated in patients with a history of  hysterectomy. Performed at [facility], [address] [place], [place] [address]   Culture, blood (routine x 2)     Status: None (Preliminary result)   Collection Time: 06/15/18  2:05 PM  Result Value Ref Range Status   Specimen Description  BLOOD RIGHT ANTECUBITAL  Final   Special Requests   Final    BOTTLES DRAWN AEROBIC AND ANAEROBIC Blood Culture results may not be optimal due to an excessive volume of blood received in culture bottles   Culture   Final    NO GROWTH < 24 HOURS Performed at [facility], [address], [place], [place] [address]    Report Status PENDING  Incomplete  Culture, blood (routine x 2)     Status: None (Preliminary result)   Collection Time: 06/15/18  2:05 PM  Result Value Ref Range Status   Specimen Description BLOOD RT FOREARM  Final   Special Requests   Final    BOTTLES DRAWN AEROBIC AND ANAEROBIC Blood Culture adequate volume   Culture   Final    NO GROWTH < 24 HOURS Performed at [facility], [address], [place], [place] [address]    Report Status PENDING  Incomplete     Radiologic Imaging: N/A  Impression/Assessment:  Penile cellulitis.  At this time no acute indication for surgical intervention.  Recommendation:  Continue IV antibiotics.   06/16/2018, 8:02 AM  Irineo Axon,  MD

## 2018-06-16 NOTE — Progress Notes (Signed)
Pharmacy Electrolyte Monitoring Consult:  Pharmacy consulted to assist in monitoring and replacing electrolytes in this[age] year old male admitted for penile cellulitis   Labs: 8/8 K 3.4  Assessment/Plan: Will replace potassium 40mEq PO x 1 dose today.  Will recheck electrolytes with am labs.  Pharmacy will continue to monitor and adjust per consult.  Pricilla RiffleAbby K Ellington, PharmD Pharmacy Resident  06/16/2018 10:07 AM

## 2018-06-16 NOTE — Plan of Care (Signed)

## 2018-06-16 NOTE — Progress Notes (Signed)
[facility] [place] at [facility]   PATIENT NAME: Gary [name]    MR#:  [id number]  DATE OF BIRTH:  January 05, 1986  SUBJECTIVE:  CHIEF COMPLAINT: Patient is doing okay but still has significant swelling and redness in his penile area  REVIEW OF SYSTEMS:  CONSTITUTIONAL: No fever, fatigue or weakness.  EYES: No blurred or double vision.  EARS, NOSE, AND THROAT: No tinnitus or ear pain.  RESPIRATORY: No cough, shortness of breath, wheezing or hemoptysis.  CARDIOVASCULAR: No chest pain, orthopnea, edema.  GASTROINTESTINAL: No nausea, vomiting, diarrhea or abdominal pain.  GENITOURINARY: Penis is painful swollen and red ENDOCRINE: No polyuria, nocturia,  HEMATOLOGY: No anemia, easy bruising or bleeding SKIN: No rash or lesion. MUSCULOSKELETAL: No joint pain or arthritis.   NEUROLOGIC: No tingling, numbness, weakness.  PSYCHIATRY: No anxiety or depression.   DRUG ALLERGIES:   Allergies  Allergen Reactions  . Caffeine Palpitations    VITALS:  Blood pressure 94/63, pulse 79, temperature 99.8 F (37.7 C), temperature source Oral, resp. rate 17, height 6\' 1"  (1.854 m), weight 66.6 kg, SpO2 94 %.  PHYSICAL EXAMINATION:  GENERAL:  32 y.o.-year-old patient lying in the bed with no acute distress.  EYES: Pupils equal, round, reactive to light and accommodation. No scleral icterus. Extraocular muscles intact.  HEENT: Head atraumatic, normocephalic. Oropharynx and nasopharynx clear.  NECK:  Supple, no jugular venous distention. No thyroid enlargement, no tenderness.  LUNGS: Normal breath sounds bilaterally, no wheezing, rales,rhonchi or crepitation. No use of accessory muscles of respiration.  CARDIOVASCULAR: S1, S2 normal. No murmurs, rubs, or gallops.  ABDOMEN: Soft, nontender, nondistended. Bowel sounds present.  GENITOURINARY-significant swelling of the penis noted, urethral opening appears normal.  Extensive redness and tenderness noted as well.  No open  sores, couple of purulent spots noted on the dorsum of penis EXTREMITIES: No pedal edema, cyanosis, or clubbing.  NEUROLOGIC: Cranial nerves II through XII are intact. Muscle strength 5/5 in all extremities. Sensation intact. Gait not checked.  PSYCHIATRIC: The patient is alert and oriented x 3.  SKIN: No obvious rash, lesion, or ulcer.    LABORATORY PANEL:   CBC Recent Labs  Lab 06/16/18 0424  WBC 12.5*  HGB 12.4*  HCT 34.9*  PLT 218   ------------------------------------------------------------------------------------------------------------------  Chemistries  Recent Labs  Lab 06/16/18 0424  NA 131*  K 3.4*  CL 99  CO2 25  GLUCOSE 161*  BUN 12  CREATININE 0.72  CALCIUM 8.1*   ------------------------------------------------------------------------------------------------------------------  Cardiac Enzymes No results for input(s): TROPONINI in the last 168 hours. ------------------------------------------------------------------------------------------------------------------  RADIOLOGY:  No results found.  EKG:   Orders placed or performed during the hospital encounter of 10/04/16  . ED EKG  . ED EKG    ASSESSMENT AND PLAN:   Gary [name]  is a 32 y.o. male with a known history of HIV not on treatment currently, tobacco use disorder, alcohol abuse and recreational drug use presents to hospital secondary to fevers, worsening pain and swelling of his penis.  1.  Sepsis-secondary to penile cellulitis from masturbating - blood cultures. -Continue IV vancomycin and Unasyn.   Urology has seen the patient, no surgical interventions are needed at this time -IV fluids and monitor -Symptomatic treatment, perineal support as needed,, ice packs  2.  Hyponatremia-secondary to dehydration.  Monitor with IV fluids  3.  History of HIV   Not following with ID currently.   Check CD4 count and HIV RNA levels.  ID consult placed paged Dr. Orvan Falconer  on-call ID as  patient is not seeing infectious disease for almost any year Will need outpatient ID follow-up at discharge.  4.  Polysubstance abuse-counseled, nicotine patch for smoking and also placed on CIWA protocol for alcohol use.  5.  DVT prophylaxis-Lovenox     All the records are reviewed and case discussed with Care Management/Social Workerr. Management plans discussed with the patient, family and they are in agreement.  CODE STATUS: FC   TOTAL TIME TAKING CARE OF THIS PATIENT: 35 minutes.   POSSIBLE D/C IN 2  DAYS, DEPENDING ON CLINICAL CONDITION.  Note: This dictation was prepared with Dragon dictation along with smaller phrase technology. Any transcriptional errors that result from this process are unintentional.   Ramonita LabAruna Gouru M.D on 06/16/2018 at 1:56 PM  Between 7am to 6pm - Pager - [phone] After 6pm go to [email or web address] - password [email or web address]  [facility]  Office  [phone]  CC: Primary care physician; Patient, No Pcp Per

## 2018-06-16 NOTE — Consult Note (Signed)
         [facility]    Date of Admission:  06/15/2018     Reason for consult: HIV infection currently off of therapy Referring provider: Dr. Ramonita LabAruna Gouru MD  Patient Active Problem List   Diagnosis Date Noted  . HIV disease (HCC) 06/16/2018    Priority: High  . Polysubstance abuse (HCC) 06/16/2018  . Cigarette smoker 06/16/2018  . Penile cellulitis 06/15/2018   Mr. Gary [name] is a 32 year old gentleman with HIV infection.  He was previously cared for by Dr. Clydie Braunavid Fitzgerald who is no longer practicing locally.  Records available to me in Epic show that he used to take Genvoya.  Apparently he is not on any antiretroviral therapy at this time.  Was [facility] to [facility] yesterday with penile cellulitis I was called to assist with his HIV management and to help with outpatient follow-up.  Gary [name] was listed on his medication list at the time of an ED visit this past March.  I am not sure when he stopped taking it.  I do not have access to any previous CD4 counts or HIV viral loads.  CD4 and viral load have been ordered.  I will add a genotype resistance assay.  I will go ahead and restart Genvoya now.  Will arrange outpatient follow-up.  Gary AstersJohn Campbell, MD [facility] [phone] pager   [phone] cell 06/16/2018, 5:14 PM

## 2018-06-17 LAB — HELPER T-LYMPH-CD4 (ARMC ONLY)
% CD 4 Pos. Lymph.: 36.5 % (ref 30.8–58.5)
Absolute CD 4 Helper: 365 /uL (ref 359–1519)
Basophils Absolute: 0 10*3/uL (ref 0.0–0.2)
Basos: 0 %
EOS (ABSOLUTE): 0 10*3/uL (ref 0.0–0.4)
Eos: 0 %
Hematocrit: 33 % — ABNORMAL LOW (ref 37.5–51.0)
Hemoglobin: 11.1 g/dL — ABNORMAL LOW (ref 13.0–17.7)
Immature Grans (Abs): 0 10*3/uL (ref 0.0–0.1)
Immature Granulocytes: 0 %
Lymphocytes Absolute: 1 10*3/uL (ref 0.7–3.1)
Lymphs: 8 %
MCH: 31.5 pg (ref 26.6–33.0)
MCHC: 33.6 g/dL (ref 31.5–35.7)
MCV: 94 fL (ref 79–97)
Monocytes Absolute: 0.9 10*3/uL (ref 0.1–0.9)
Monocytes: 7 %
Neutrophils Absolute: 10.8 10*3/uL — ABNORMAL HIGH (ref 1.4–7.0)
Neutrophils: 85 %
Platelets: 235 10*3/uL (ref 150–450)
RBC: 3.52 x10E6/uL — ABNORMAL LOW (ref 4.14–5.80)
RDW: 13.8 % (ref 12.3–15.4)
WBC: 12.7 10*3/uL — ABNORMAL HIGH (ref 3.4–10.8)

## 2018-06-17 LAB — URINE CULTURE: Culture: NO GROWTH

## 2018-06-17 LAB — BASIC METABOLIC PANEL
Anion gap: 7 (ref 5–15)
BUN: 6 mg/dL (ref 6–20)
CO2: 26 mmol/L (ref 22–32)
Calcium: 8.3 mg/dL — ABNORMAL LOW (ref 8.9–10.3)
Chloride: 103 mmol/L (ref 98–111)
Creatinine, Ser: 0.66 mg/dL (ref 0.61–1.24)
GFR calc Af Amer: >60 mL/min (ref >60)
GFR calc non Af Amer: >60 mL/min (ref >60)
Glucose, Bld: 104 mg/dL — ABNORMAL HIGH (ref 70–99)
Potassium: 3.8 mmol/L (ref 3.5–5.1)
Sodium: 136 mmol/L (ref 135–145)

## 2018-06-17 LAB — HIV-1 RNA QUANT-NO REFLEX-BLD
HIV 1 RNA Quant: 96400 copies/mL
LOG10 HIV-1 RNA: 4.984 log10copy/mL

## 2018-06-17 LAB — VANCOMYCIN, TROUGH: Vancomycin Tr: 9 ug/mL — ABNORMAL LOW (ref 15–20)

## 2018-06-17 MED ORDER — SODIUM CHLORIDE 0.9 % IV SOLN
1250.0000 mg | Freq: Three times a day (TID) | INTRAVENOUS | Status: DC
Start: 2018-06-17 — End: 2018-06-19
  Administered 2018-06-17 – 2018-06-19 (×5): 1250 mg via INTRAVENOUS
  Filled 2018-06-17 (×9): qty 1250

## 2018-06-17 NOTE — Progress Notes (Signed)
[facility] [place] at [facility]   PATIENT NAME: Gary [name]    MR#:  [id number]  DATE OF BIRTH:  03/13/1986  SUBJECTIVE:  CHIEF COMPLAINT: Patient is doing okay redness and swelling improved in his penile area  REVIEW OF SYSTEMS:  CONSTITUTIONAL: No fever, fatigue or weakness.  EYES: No blurred or double vision.  EARS, NOSE, AND THROAT: No tinnitus or ear pain.  RESPIRATORY: No cough, shortness of breath, wheezing or hemoptysis.  CARDIOVASCULAR: No chest pain, orthopnea, edema.  GASTROINTESTINAL: No nausea, vomiting, diarrhea or abdominal pain.  GENITOURINARY: Penis is painful swollen and red ENDOCRINE: No polyuria, nocturia,  HEMATOLOGY: No anemia, easy bruising or bleeding SKIN: No rash or lesion. MUSCULOSKELETAL: No joint pain or arthritis.   NEUROLOGIC: No tingling, numbness, weakness.  PSYCHIATRY: No anxiety or depression.   DRUG ALLERGIES:   Allergies  Allergen Reactions  . Caffeine Palpitations    VITALS:  Blood pressure 99/64, pulse 69, temperature 98.6 F (37 C), temperature source Oral, resp. rate 20, height 6\' 1"  (1.854 m), weight 66.6 kg, SpO2 96 %.  PHYSICAL EXAMINATION:  GENERAL:  32 y.o.-year-old patient lying in the bed with no acute distress.  EYES: Pupils equal, round, reactive to light and accommodation. No scleral icterus. Extraocular muscles intact.  HEENT: Head atraumatic, normocephalic. Oropharynx and nasopharynx clear.  NECK:  Supple, no jugular venous distention. No thyroid enlargement, no tenderness.  LUNGS: Normal breath sounds bilaterally, no wheezing, rales,rhonchi or crepitation. No use of accessory muscles of respiration.  CARDIOVASCULAR: S1, S2 normal. No murmurs, rubs, or gallops.  ABDOMEN: Soft, nontender, nondistended. Bowel sounds present.  GENITOURINARY-significant swelling of the penis noted, urethral opening appears normal.  Extensive redness and tenderness improving , noticed blisters , couple of  purulent spots noted on the dorsum of penis EXTREMITIES: No pedal edema, cyanosis, or clubbing.  NEUROLOGIC: Cranial nerves II through XII are intact. Muscle strength 5/5 in all extremities. Sensation intact. Gait not checked.  PSYCHIATRIC: The patient is alert and oriented x 3.  SKIN: No obvious rash, lesion, or ulcer.    LABORATORY PANEL:   CBC Recent Labs  Lab 06/16/18 0424  WBC 12.5*  HGB 12.4*  HCT 34.9*  PLT 218   ------------------------------------------------------------------------------------------------------------------  Chemistries  Recent Labs  Lab 06/17/18 0740  NA 136  K 3.8  CL 103  CO2 26  GLUCOSE 104*  BUN 6  CREATININE 0.66  CALCIUM 8.3*   ------------------------------------------------------------------------------------------------------------------  Cardiac Enzymes No results for input(s): TROPONINI in the last 168 hours. ------------------------------------------------------------------------------------------------------------------  RADIOLOGY:  No results found.  EKG:   Orders placed or performed during the hospital encounter of 10/04/16  . ED EKG  . ED EKG    ASSESSMENT AND PLAN:   Gary Rightony Peale  is a 32 y.o. male with a known history of HIV not on treatment currently, tobacco use disorder, alcohol abuse and recreational drug use presents to hospital secondary to fevers, worsening pain and swelling of his penis.  1.  Sepsis-secondary to penile cellulitis from masturbating - blood cultures negative -Check HSV PCR, will add acyclovir if no clinical improvement -Continue IV vancomycin and Unasyn.   Urology has seen the patient, no surgical interventions are needed at this time -IV fluids and monitor -Symptomatic treatment, perineal support as needed,, ice packs  2.  Hyponatremia-secondary to dehydration.  Monitor with IV fluids  3.  History of HIV   Not following with ID currently.   Check CD4 count and HIV RNA levels.  ID  consult placed paged Dr. Orvan Falconer on-call ID Will need outpatient ID follow-up at discharge.  Patient stopped seeing ID almost for 1 year Restarted  Genvoya .   4.  Polysubstance abuse-counseled, nicotine patch for smoking and also placed on CIWA protocol for alcohol use.  5.  DVT prophylaxis-Lovenox     All the records are reviewed and case discussed with Care Management/Social Workerr. Management plans discussed with the patient, family and they are in agreement.  CODE STATUS: FC   TOTAL TIME TAKING CARE OF THIS PATIENT: 35 minutes.   POSSIBLE D/C IN 2  DAYS, DEPENDING ON CLINICAL CONDITION.  Note: This dictation was prepared with Dragon dictation along with smaller phrase technology. Any transcriptional errors that result from this process are unintentional.   Ramonita Lab M.D on 06/17/2018 at 4:27 PM  Between 7am to 6pm - Pager - [phone] After 6pm go to [email or web address] - password [email or web address]  [facility]  Office  [phone]  CC: Primary care physician; Patient, No Pcp Per

## 2018-06-17 NOTE — Care Management (Signed)
Patient admitted from home with Sepsis-secondary to penile cellulitis.  Patient states that he lives at home with "someone".  Is unemployed, does not have insurance, and does not have a PCP.  Patient states that he stopped following at the infectious disease clinic for his o42  approximately a year ago.  Patient did not give a specific reason why.  Patient states that he does not have his own transportation.  He either walks or relies on friends for transportation. Patient does have a history of drug use.    RNCM spoke with infectious disease MD Cliffton AstersJohn Campbell.  States the plan to to restart patient on Genvoya.  Per MD patient will either start at [facility] with the new physician that took over for Dr. Sampson GoonFitzgerald.  Or [place].  MD states "we have a whole team that will help with appointment and medications".  MD states that he will have the clinic reach out to the patient on Monday.  Medication Management  And [facility] applications provided to patient.  RNCM following for other medications at discharge.

## 2018-06-17 NOTE — Progress Notes (Signed)
Pharmacy Antibiotic Note  Gary [name] is a 32 y.o. male admitted on 06/15/2018 with cellulitis.  Pharmacy has been consulted for vancomycin and Unasyn dosing.  Plan: Increase vancomycin to 1250 mg IV q8h. Trough ordered for 8/10 @ 1000. Goal trough 15-20 mcg/ml Continue Unasyn 3 g IV q6h  Height: 6\' 1"  (185.4 cm) Weight: 146 lb 13.2 oz (66.6 kg) IBW/kg (Calculated) : 79.9  Temp (24hrs), Avg:99.3 F (37.4 C), Min:98.8 F (37.1 C), Max:99.8 F (37.7 C)  Recent Labs  Lab 06/15/18 1405 06/16/18 0424 06/17/18 0740 06/17/18 0922  WBC 12.5* 12.5*  --   --   CREATININE 0.79 0.72 0.66  --   LATICACIDVEN 0.8  --   --   --   VANCOTROUGH  --   --   --  9*    Estimated Creatinine Clearance: 126 mL/min (by C-G formula based on SCr of 0.66 mg/dL).    Allergies  Allergen Reactions  . Caffeine Palpitations    Antimicrobials this admission: Unasyn 8/7 >>  Vancomycin 8/7 >>   Dose adjustments this admission: 8/9 Vancomycin 1 g IV q8h >> 1250 mg IV q8h  Microbiology results: 8/7 BCx: NGTD 8/7 UCx: NGTD  Thank you for allowing pharmacy to be a part of this patient's care.  Pricilla RiffleAbby K Ellington, PharmD Pharmacy Resident  06/17/2018 11:00 AM

## 2018-06-17 NOTE — Progress Notes (Signed)
Urology Consult Follow Up  Subjective: Pain improved, fever decreased  Anti-infectives: Anti-infectives (From admission, onward)   Start     Dose/Rate Route Frequency Ordered Stop   06/17/18 1030  vancomycin (VANCOCIN) 1,250 mg in sodium chloride 0.9 % 250 mL IVPB     1,250 mg 166.7 mL/hr over 90 Minutes Intravenous Every 8 hours 06/17/18 1018     06/17/18 0800  elvitegravir-cobicistat-emtricitabine-tenofovir (GENVOYA) 150-150-200-10 MG tablet 1 tablet     1 tablet Oral Daily with breakfast 06/16/18 1721     06/16/18 0200  vancomycin (VANCOCIN) IVPB 1000 mg/200 mL premix  Status:  Discontinued     1,000 mg 200 mL/hr over 60 Minutes Intravenous Every 8 hours 06/15/18 1750 06/17/18 1018   06/16/18 0000  Ampicillin-Sulbactam (UNASYN) 3 g in sodium chloride 0.9 % 100 mL IVPB     3 g 200 mL/hr over 30 Minutes Intravenous Every 6 hours 06/15/18 1728     06/15/18 1745  vancomycin (VANCOCIN) IVPB 1000 mg/200 mL premix     1,000 mg 200 mL/hr over 60 Minutes Intravenous  Once 06/15/18 1736 06/15/18 2135   06/15/18 1445  Ampicillin-Sulbactam (UNASYN) 3 g in sodium chloride 0.9 % 100 mL IVPB     3 g 200 mL/hr over 30 Minutes Intravenous  Once 06/15/18 1438 06/15/18 1516      Current Facility-Administered Medications  Medication Dose Route Frequency Provider Last Rate Last Dose  . 0.9 %  sodium chloride infusion   Intravenous Continuous Enid BaasKalisetti, Radhika, MD 100 mL/hr at 06/17/18 1312    . acetaminophen (TYLENOL) tablet 650 mg  650 mg Oral Q6H PRN Enid BaasKalisetti, Radhika, MD       Or  . acetaminophen (TYLENOL) suppository 650 mg  650 mg Rectal Q6H PRN Enid BaasKalisetti, Radhika, MD      . Ampicillin-Sulbactam (UNASYN) 3 g in sodium chloride 0.9 % 100 mL IVPB  3 g Intravenous Q6H Enid BaasKalisetti, Radhika, MD   Stopped at 06/17/18 1343  . elvitegravir-cobicistat-emtricitabine-tenofovir (GENVOYA) 150-150-200-10 MG tablet 1 tablet  1 tablet Oral Q breakfast Cliffton Astersampbell, John, MD   1 tablet at 06/17/18 [phone]  .  enoxaparin (LOVENOX) injection 40 mg  40 mg Subcutaneous Q24H Enid BaasKalisetti, Radhika, MD   40 mg at 06/15/18 2316  . folic acid (FOLVITE) tablet 1 mg  1 mg Oral Daily Enid BaasKalisetti, Radhika, MD   1 mg at 06/17/18 0923  . LORazepam (ATIVAN) tablet 1 mg  1 mg Oral Q6H PRN Enid BaasKalisetti, Radhika, MD       Or  . LORazepam (ATIVAN) injection 1 mg  1 mg Intravenous Q6H PRN Enid BaasKalisetti, Radhika, MD      . LORazepam (ATIVAN) tablet 0-4 mg  0-4 mg Oral Q6H Enid BaasKalisetti, Radhika, MD       Followed by  . [START ON 06/18/2018] LORazepam (ATIVAN) tablet 0-4 mg  0-4 mg Oral Q12H Enid BaasKalisetti, Radhika, MD      . morphine 2 MG/ML injection 2 mg  2 mg Intravenous Q4H PRN Enid BaasKalisetti, Radhika, MD   2 mg at 06/17/18 0509  . multivitamin with minerals tablet 1 tablet  1 tablet Oral Daily Enid BaasKalisetti, Radhika, MD   1 tablet at 06/17/18 0923  . nicotine (NICODERM CQ - dosed in mg/24 hours) patch 21 mg  21 mg Transdermal Daily Enid BaasKalisetti, Radhika, MD      . ondansetron (ZOFRAN) tablet 4 mg  4 mg Oral Q6H PRN Enid BaasKalisetti, Radhika, MD       Or  . ondansetron (ZOFRAN) injection 4 mg  4  mg Intravenous Q6H PRN Enid Baas, MD      . oxyCODONE (Oxy IR/ROXICODONE) immediate release tablet 5 mg  5 mg Oral Q4H PRN Enid Baas, MD   5 mg at 06/17/18 1601  . thiamine (VITAMIN B-1) tablet 100 mg  100 mg Oral Daily Enid Baas, MD   100 mg at 06/17/18 0923  . vancomycin (VANCOCIN) 1,250 mg in sodium chloride 0.9 % 250 mL IVPB  1,250 mg Intravenous Q8H Pricilla Riffle, RPH   Stopped at 06/17/18 1302     Objective: Vital signs in last 24 hours: Temp:  [98.6 F (37 C)-99.6 F (37.6 C)] 98.6 F (37 C) (08/09 1224) Pulse Rate:  [66-84] 69 (08/09 1224) Resp:  [16-20] 20 (08/09 1224) BP: (95-101)/(58-64) 99/64 (08/09 1224) SpO2:  [96 %-98 %] 96 % (08/09 1224)  Intake/Output from previous day: 08/08 0701 - 08/09 0700 In: 4366 [P.O.:420; I.V.:3128; IV Piggyback:818] Out: 2775 [Urine:2775] Intake/Output this shift: Total I/O In:  559 [P.O.:240; I.V.:319] Out: 1500 [Urine:1500]   Physical Exam: Less tenderness.  Mild penile erythema.  Edema persist.  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  Scant exudative areas at the distal prepuce.  Lab Results:  Recent Labs    06/15/18 1902 06/16/18 0424  WBC 12.7* 12.5*  HGB 11.1* 12.4*  HCT 33.0* 34.9*  PLT 235 218   BMET Recent Labs    06/16/18 0424 06/17/18 0740  NA 131* 136  K 3.4* 3.8  CL 99 103  CO2 25 26  GLUCOSE 161* 104*  BUN 12 6  CREATININE 0.72 0.66  CALCIUM 8.1* 8.3*    Assessment: Penile cellulitis; improving.  It may take several weeks for the penile edema to resolve.  No indication at this point for surgical intervention/drainage  Plan: Continue IV antibiotics.     LOS: 2 days    Riki Altes 06/17/2018

## 2018-06-17 NOTE — Progress Notes (Signed)
Pharmacy Electrolyte Monitoring Consult:  Pharmacy consulted to assist in monitoring and replacing electrolytes in this89 year old male admitted for penile cellulitis  Labs: 8/8 K 3.4 8/9 K 3.8  Assessment/Plan: Potassium WNL today. No supplementation required.  Will recheck electrolytes with am labs.  Pharmacy will continue to monitor and adjust per consult.  Pricilla RiffleAbby K Ellington, PharmD Pharmacy Resident  06/17/2018 9:11 AM

## 2018-06-17 NOTE — Clinical Social Work Note (Signed)
CSW went to speak with patient this morning to discuss resources for his potential drug abuse. Patient was not interested in any resources and stated he did not have a drug problem. Patient was not interested in discussing anything further. York SpanielMonica Marra MSW,LCSW [phone]

## 2018-06-18 LAB — CBC
HCT: 34.1 % — ABNORMAL LOW (ref 40.0–52.0)
Hemoglobin: 12.1 g/dL — ABNORMAL LOW (ref 13.0–18.0)
MCH: 32.7 pg (ref 26.0–34.0)
MCHC: 35.6 g/dL (ref 32.0–36.0)
MCV: 91.8 fL (ref 80.0–100.0)
Platelets: 208 10*3/uL (ref 150–440)
RBC: 3.71 MIL/uL — ABNORMAL LOW (ref 4.40–5.90)
RDW: 12.8 % (ref 11.5–14.5)
WBC: 6 10*3/uL (ref 3.8–10.6)

## 2018-06-18 LAB — BASIC METABOLIC PANEL
Anion gap: 7 (ref 5–15)
BUN: 6 mg/dL (ref 6–20)
CO2: 28 mmol/L (ref 22–32)
Calcium: 8.6 mg/dL — ABNORMAL LOW (ref 8.9–10.3)
Chloride: 104 mmol/L (ref 98–111)
Creatinine, Ser: 0.68 mg/dL (ref 0.61–1.24)
GFR calc Af Amer: >60 mL/min (ref >60)
GFR calc non Af Amer: >60 mL/min (ref >60)
Glucose, Bld: 109 mg/dL — ABNORMAL HIGH (ref 70–99)
Potassium: 4 mmol/L (ref 3.5–5.1)
Sodium: 139 mmol/L (ref 135–145)

## 2018-06-18 LAB — VANCOMYCIN, TROUGH: Vancomycin Tr: 13 ug/mL — ABNORMAL LOW (ref 15–20)

## 2018-06-18 NOTE — Progress Notes (Signed)
Pharmacy Antibiotic Note  Gary [name] is a 32 y.o. male admitted on 06/15/2018 with cellulitis.  Pharmacy has been consulted for vancomycin and Unasyn dosing. Per MD note on 8/9, redness and swelling improved. Per urology no indication for I&D. WBC wnl today and afebrile.   Plan: Vancomycin level = 13 mcg/ml before 4th dose, not yet at steady state.   Will continue current dose of vancomycin 1250 mg IV q8h and recheck trough at steady state. Next trough ordered for 8/11 @ 1000 with SCr.   Continue Unasyn 3 g IV q6h  Height: 6\' 1"  (185.4 cm) Weight: 146 lb 13.2 oz (66.6 kg) IBW/kg (Calculated) : 79.9  Temp (24hrs), Avg:98.5 F (36.9 C), Min:98.5 F (36.9 C), Max:98.6 F (37 C)  Recent Labs  Lab 06/15/18 1405 06/15/18 1902 06/16/18 0424 06/17/18 0740 06/17/18 0922 06/18/18 0440 06/18/18 0952  WBC 12.5* 12.7* 12.5*  --   --  6.0  --   CREATININE 0.79  --  0.72 0.66  --  0.68  --   LATICACIDVEN 0.8  --   --   --   --   --   --   VANCOTROUGH  --   --   --   --  9*  --  13*    Estimated Creatinine Clearance: 126 mL/min (by C-G formula based on SCr of 0.68 mg/dL).    Allergies  Allergen Reactions  . Caffeine Palpitations    Antimicrobials this admission: Unasyn 8/7 >>  Vancomycin 8/7 >>   Dose adjustments this admission: 8/9 Vancomycin 1 g IV q8h >> 1250 mg IV q8h  Microbiology results: 8/7 BCx: NGTD 8/7 UCx: NG  Thank you for allowing pharmacy to be a part of this patient's care.  Crist FatHannah Wang, PharmD, BCPS Clinical Pharmacist 06/18/2018 10:40 AM

## 2018-06-18 NOTE — Progress Notes (Signed)
[facility] [place] [facility]   PATIENT NAME: Gary [name]    MR#:  [id number]  DATE OF BIRTH:  10/01/1986  SUBJECTIVE:  CHIEF COMPLAINT: Patient reports redness and swelling are improving   REVIEW OF SYSTEMS:  CONSTITUTIONAL: No fever, fatigue or weakness.  EYES: No blurred or double vision.  EARS, NOSE, AND THROAT: No tinnitus or ear pain.  RESPIRATORY: No cough, shortness of breath, wheezing or hemoptysis.  CARDIOVASCULAR: No chest pain, orthopnea, edema.  GASTROINTESTINAL: No nausea, vomiting, diarrhea or abdominal pain.  GENITOURINARY: Penis is painful swollen and red ENDOCRINE: No polyuria, nocturia,  HEMATOLOGY: No anemia, easy bruising or bleeding SKIN: No rash or lesion. MUSCULOSKELETAL: No joint pain or arthritis.   NEUROLOGIC: No tingling, numbness, weakness.  PSYCHIATRY: No anxiety or depression.   DRUG ALLERGIES:   Allergies  Allergen Reactions  . Caffeine Palpitations    VITALS:  Blood pressure 106/60, pulse 72, temperature 98.6 F (37 C), temperature source Oral, resp. rate 20, height 6\' 1"  (1.854 m), weight 66.6 kg, SpO2 100 %.  PHYSICAL EXAMINATION:  GENERAL:  32 y.o.-year-old patient lying in the bed with no acute distress.  EYES: Pupils equal, round, reactive to light and accommodation. No scleral icterus. Extraocular muscles intact.  HEENT: Head atraumatic, normocephalic. Oropharynx and nasopharynx clear.  NECK:  Supple, no jugular venous distention. No thyroid enlargement, no tenderness.  LUNGS: Normal breath sounds bilaterally, no wheezing, rales,rhonchi or crepitation. No use of accessory muscles of respiration.  CARDIOVASCULAR: S1, S2 normal. No murmurs, rubs, or gallops.  ABDOMEN: Soft, nontender, nondistended. Bowel sounds present.  GENITOURINARY-significant swelling of the penis noted, urethral opening appears normal.  Extensive redness and tenderness improving , noticed blisters , couple of purulent spots noted on  the dorsum of penis EXTREMITIES: No pedal edema, cyanosis, or clubbing.  NEUROLOGIC: Cranial nerves II through XII are intact. Muscle strength 5/5 in all extremities. Sensation intact. Gait not checked.  PSYCHIATRIC: The patient is alert and oriented x 3.  SKIN: No obvious rash, lesion, or ulcer.    LABORATORY PANEL:   CBC Recent Labs  Lab 06/18/18 0440  WBC 6.0  HGB 12.1*  HCT 34.1*  PLT 208   ------------------------------------------------------------------------------------------------------------------  Chemistries  Recent Labs  Lab 06/18/18 0440  NA 139  K 4.0  CL 104  CO2 28  GLUCOSE 109*  BUN 6  CREATININE 0.68  CALCIUM 8.6*   ------------------------------------------------------------------------------------------------------------------  Cardiac Enzymes No results for input(s): TROPONINI in the last 168 hours. ------------------------------------------------------------------------------------------------------------------  RADIOLOGY:  No results found.  EKG:   Orders placed or performed during the hospital encounter of 10/04/16  . ED EKG  . ED EKG    ASSESSMENT AND PLAN:   Gary [name]  is a 32 y.o. male with a known history of HIV not on treatment currently, tobacco use disorder, alcohol abuse and recreational drug use presents to hospital secondary to fevers, worsening pain and swelling of his penis.  1.  Sepsis-secondary to penile cellulitis from masturbating - blood cultures negative -Check HSV PCR, will add acyclovir if no clinical improvement -Continue IV vancomycin and Unasyn.  Will change to Augmentin at the time of discharge  Urology has seen the patient, no surgical interventions are needed at this time -IV fluids and monitor -Symptomatic treatment, perineal support as needed,, ice packs  2.  Hyponatremia-secondary to dehydration.  Monitor with IV fluids  3.  History of HIV   Not following with ID currently.   Pending CD4  count and HIV RNA 96,400 levels.  ID consult placed paged Dr. Orvan Falconer on-call ID Will need outpatient ID follow-up at discharge.  Patient stopped seeing ID almost for 1 year Restarted  Genvoya .   4.  Polysubstance abuse-counseled, nicotine patch for smoking and also placed on CIWA protocol for alcohol use.  5.  DVT prophylaxis-Lovenox     All the records are reviewed and case discussed with Care Management/Social Workerr. Management plans discussed with the patient, family and they are in agreement.  CODE STATUS: FC   TOTAL TIME TAKING CARE OF THIS PATIENT: 33 minutes.   POSSIBLE D/C IN 1-2  DAYS, DEPENDING ON CLINICAL CONDITION.  Note: This dictation was prepared with Dragon dictation along with smaller phrase technology. Any transcriptional errors that result from this process are unintentional.   Ramonita Lab M.D on 06/18/2018 at 1:34 PM  Between 7am to 6pm - Pager - [phone] After 6pm go to [email or web address] - password [email or web address]  [facility]  Office  [phone]  CC: Primary care physician; Patient, No Pcp Per

## 2018-06-18 NOTE — Progress Notes (Signed)
Pharmacy Electrolyte Monitoring Consult:  Pharmacy consulted to assist in monitoring and replacing electrolytes in this[age] year old male admitted for penile cellulitis  Labs: 8/8 K 3.4 - KCl 40 mEq PO x1 8/9 K 3.8 8/10 K 4.0  Assessment/Plan: Potassium WNL today. No supplementation required.  Will recheck electrolytes in 2 days (Monday) with am labs.  Pharmacy will continue to monitor and adjust per consult.  Crist FatHannah Wang, PharmD, BCPS Clinical Pharmacist 06/18/2018 10:28 AM

## 2018-06-19 LAB — CREATININE, SERUM
Creatinine, Ser: 0.74 mg/dL (ref 0.61–1.24)
GFR calc Af Amer: >60 mL/min (ref >60)
GFR calc non Af Amer: >60 mL/min (ref >60)

## 2018-06-19 LAB — VANCOMYCIN, TROUGH: Vancomycin Tr: 12 ug/mL — ABNORMAL LOW (ref 15–20)

## 2018-06-19 MED ORDER — ADULT MULTIVITAMIN W/MINERALS CH: 1.0000 | ORAL_TABLET | Freq: Every day | ORAL | Status: DC

## 2018-06-19 MED ORDER — FOLIC ACID 1 MG PO TABS: 1.0000 mg | ORAL_TABLET | Freq: Every day | ORAL | 0 refills | Status: DC

## 2018-06-19 MED ORDER — AMOXICILLIN-POT CLAVULANATE 875-125 MG PO TABS: 1.0000 | ORAL_TABLET | Freq: Two times a day (BID) | ORAL | 0 refills | Status: AC

## 2018-06-19 MED ORDER — ELVITEG-COBIC-EMTRICIT-TENOFAF 150-150-200-10 MG PO TABS: 1.0000 | ORAL_TABLET | Freq: Every day | ORAL | 0 refills | Status: DC

## 2018-06-19 MED ORDER — DOXYCYCLINE HYCLATE 100 MG PO TABS: 100.0000 mg | ORAL_TABLET | Freq: Two times a day (BID) | ORAL | Status: DC

## 2018-06-19 MED ORDER — SACCHAROMYCES BOULARDII 250 MG PO CAPS: 250.0000 mg | ORAL_CAPSULE | Freq: Two times a day (BID) | ORAL | 0 refills | Status: AC

## 2018-06-19 MED ORDER — NICOTINE 21 MG/24HR TD PT24: 21.0000 mg | MEDICATED_PATCH | Freq: Every day | TRANSDERMAL | 0 refills | Status: DC

## 2018-06-19 MED ORDER — THIAMINE HCL 100 MG PO TABS: 100.0000 mg | ORAL_TABLET | Freq: Every day | ORAL | 0 refills | Status: AC

## 2018-06-19 MED ORDER — DOXYCYCLINE HYCLATE 100 MG PO TABS: 100.0000 mg | ORAL_TABLET | Freq: Two times a day (BID) | ORAL | 0 refills | Status: DC

## 2018-06-19 MED ORDER — ACETAMINOPHEN 325 MG PO TABS: 650.0000 mg | ORAL_TABLET | Freq: Four times a day (QID) | ORAL | Status: DC | PRN

## 2018-06-19 NOTE — Progress Notes (Signed)
Patient cleared for discharge by Dr Amado CoeGouru      Education complete. AVS printed. Discharge instructions given. All questions answered for patient clarification.  Prescriptions given, pharmacy verified.  IVs removed.  Discharged to home via POV

## 2018-06-19 NOTE — Progress Notes (Signed)
Pharmacy Antibiotic Note  Gary [name] is a 32 y.o. male admitted on 06/15/2018 with cellulitis.  Pharmacy has been consulted for vancomycin and Unasyn dosing. Per MD note on 8/9, redness and swelling improved. Per urology no indication for I&D. WBC wnl today and afebrile.   Plan: 8/10: Vancomycin level = 13 mcg/ml before 4th dose, not yet at steady state. 8/11: vancomycin level = 12 mcg/ml  - yesterday evening's dose charted as not given (RN comment says "previous administration late" unclear if pharmacy was called). Level likely lower due to missed dose.   Will continue current dose of vancomycin 1250 mg IV q8h. Pt with d/c orders currently. Will follow along for next trough if pt still here tomorrow.   Continue Unasyn 3 g IV q6h  Height: 6\' 1"  (185.4 cm) Weight: 146 lb 13.2 oz (66.6 kg) IBW/kg (Calculated) : 79.9  Temp (24hrs), Avg:98.3 F (36.8 C), Min:98.1 F (36.7 C), Max:98.6 F (37 C)  Recent Labs  Lab 06/15/18 1405 06/15/18 1902 06/16/18 0424 06/17/18 0740  06/18/18 0440 06/18/18 0952 06/19/18 0957  WBC 12.5* 12.7* 12.5*  --   --  6.0  --   --   CREATININE 0.79  --  0.72 0.66  --  0.68  --  0.74  LATICACIDVEN 0.8  --   --   --   --   --   --   --   VANCOTROUGH  --   --   --   --    < >  --  13* 12*   < > = values in this interval not displayed.    Estimated Creatinine Clearance: 126 mL/min (by C-G formula based on SCr of 0.74 mg/dL).    Allergies  Allergen Reactions  . Caffeine Palpitations    Antimicrobials this admission: Unasyn 8/7 >>  Vancomycin 8/7 >>   Dose adjustments this admission: 8/9 Vancomycin 1 g IV q8h >> 1250 mg IV q8h  Microbiology results: 8/7 BCx: NGTD 8/7 UCx: NG  Thank you for allowing pharmacy to be a part of this patient's care.  Crist FatHannah Liliauna Santoni, PharmD, BCPS Clinical Pharmacist 06/19/2018 12:10 PM

## 2018-06-19 NOTE — Discharge Instructions (Signed)
Follow-up with primary care physician in 2 to 3 days Follow-up with infectious disease in 4 to 5 days or sooner as needed Pick up HIV medicine from the clinic tomorrow on Monday, June 20, 2018 Outpatient alcohol Anonymous

## 2018-06-19 NOTE — Discharge Summary (Signed)
[facility] - [place] at [facility]   PATIENT NAME: Gary [name]    MR#:  [id number]  DATE OF BIRTH:  [birth year]  DATE OF ADMISSION:  06/15/2018 ADMITTING PHYSICIAN: Enid Baas, MD  DATE OF DISCHARGE:  06/19/18  PRIMARY CARE PHYSICIAN: Patient, No Pcp Per    ADMISSION DIAGNOSIS:  Penile cellulitis [N48.22]  DISCHARGE DIAGNOSIS:  Active Problems:   Penile cellulitis   HIV disease (HCC)   Polysubstance abuse (HCC)   Cigarette smoker   SECONDARY DIAGNOSIS:   Past Medical History:  Diagnosis Date  . HIV (human immunodeficiency virus infection) [facility])     HOSPITAL COURSE:   HPI  Gary [name]  is a 32 y.o. male with a known history of HIV not on treatment currently, tobacco use disorder, alcohol abuse and recreational drug use presents to hospital secondary to fevers, worsening pain and swelling of his penis. Patient has been using methamphetamine and also benzos over the last few days.  He says he was masturbating rigorously yesterday and noticed some burning and pain in his penile area.  He started applying lotion and also his own saliva to lubricate it.  Late last night, he was having pain on urination and noted that his penis was significantly swollen and red.  He was also running fevers and so presented to the emergency room.  Noted to be septic with high fevers and also elevated white count.  1. Sepsis-secondary to penile cellulitis from masturbating - blood cultures negative -ordered  HSV PCR, I do not see the results but patient clinically improved without acyclovir -Improved with IV vancomycin and Unasyn.  Will discharge patient with Augmentin and doxycycline 100 mg bid with full stomach for 1 week Discussed with Dr. Esther Hardy ID, appreciate the recommendations Urology has seen the patient, no surgical interventions are needed at this time -Symptomatic treatment, perineal support as needed,, ice packs -Stop masturbation  2.  Hyponatremia-secondary to dehydration. Resolved with IV fluids - 3. History of HIV  Not following with ID currently.  Pending CD4 count and HIV RNA 96,400 levels.  ID consult placed paged Dr. Orvan Falconer on-call ID Will need outpatient ID follow-up at discharge.  Patient stopped seeing ID almost for 1 year Restarted  Genvoya .  Today's dose of Genvoya was given patient will get a prescription for the same and he has to pick up the medication from ID clinic tomorrow.  Case management consulted they have provided the information   4. Polysubstance abuse-counseled, nicotine patch for smoking and also placed on CIWA protocol for alcohol use.  Outpatient follow-up with alcohol Anonymous  5. DVT prophylaxis-Lovenox  DISCHARGE CONDITIONS:  STABLE  CONSULTS OBTAINED:  Treatment Team:  Riki Altes, MD   PROCEDURES  NONE   DRUG ALLERGIES:   Allergies  Allergen Reactions  . Caffeine Palpitations    DISCHARGE MEDICATIONS:   Allergies as of 06/19/2018      Reactions   Caffeine Palpitations      Medication List    TAKE these medications   acetaminophen 325 MG tablet Commonly known as:  TYLENOL Take 2 tablets (650 mg total) by mouth every 6 (six) hours as needed for mild pain (or Fever >/= 101).   amoxicillin-clavulanate 875-125 MG tablet Commonly known as:  AUGMENTIN Take 1 tablet by mouth 2 (two) times daily for 7 days.   doxycycline 100 MG tablet Commonly known as:  VIBRA-TABS Take 1 tablet (100 mg total) by mouth every 12 (twelve) hours.   elvitegravir-cobicistat-emtricitabine-tenofovir  150-150-200-10 MG Tabs tablet Commonly known as:  GENVOYA Take 1 tablet by mouth daily with breakfast. Start taking on:  06/20/2018   folic acid 1 MG tablet Commonly known as:  FOLVITE Take 1 tablet (1 mg total) by mouth daily.   multivitamin with minerals Tabs tablet Take 1 tablet by mouth daily.   nicotine 21 mg/24hr patch Commonly known as:  NICODERM CQ - dosed in  mg/24 hours Place 1 patch (21 mg total) onto the skin daily.   saccharomyces boulardii 250 MG capsule Commonly known as:  FLORASTOR Take 1 capsule (250 mg total) by mouth 2 (two) times daily for 12 days.   thiamine 100 MG tablet Take 1 tablet (100 mg total) by mouth daily.        DISCHARGE INSTRUCTIONS:  Follow-up with primary care physician in 2 to 3 days Follow-up with infectious disease in 4 to 5 days or sooner as needed Pick up HIV medicine from the clinic tomorrow on Monday, June 20, 2018 Outpatient alcohol Anonymous  DIET:  Regular diet  DISCHARGE CONDITION:  Stable  ACTIVITY:  Activity as tolerated  OXYGEN:  Home Oxygen: No.   Oxygen Delivery: room air  DISCHARGE LOCATION:  home   If you experience worsening of your admission symptoms, develop shortness of breath, life threatening emergency, suicidal or homicidal thoughts you must seek medical attention immediately by calling 911 or calling your MD immediately  if symptoms less severe.  You Must read complete instructions/literature along with all the possible adverse reactions/side effects for all the Medicines you take and that have been prescribed to you. Take any new Medicines after you have completely understood and accpet all the possible adverse reactions/side effects.   Please note  You were cared for by a hospitalist during your hospital stay. If you have any questions about your discharge medications or the care you received while you were in the hospital after you are discharged, you can call the unit and asked to speak with the hospitalist on call if the hospitalist that took care of you is not available. Once you are discharged, your primary care physician will handle any further medical issues. Please note that NO REFILLS for any discharge medications will be authorized once you are discharged, as it is imperative that you return to your primary care physician (or establish a relationship with a  primary care physician if you do not have one) for your aftercare needs so that they can reassess your need for medications and monitor your lab values.     Today  Chief Complaint  Patient presents with  . Groin Swelling   Patient is feeling much better.  Swelling, pain and redness of the penile area significantly improved.  The blisters are crusted  ROS:  CONSTITUTIONAL: Denies fevers, chills. Denies any fatigue, weakness.  EYES: Denies blurry vision, double vision, eye pain. EARS, NOSE, THROAT: Denies tinnitus, ear pain, hearing loss. RESPIRATORY: Denies cough, wheeze, shortness of breath.  CARDIOVASCULAR: Denies chest pain, palpitations, edema.  GASTROINTESTINAL: Denies nausea, vomiting, diarrhea, abdominal pain. Denies bright red blood per rectum. GENITOURINARY: significantly improved  swelling of the penis noted, urethral opening appears normal.  redness and tenderness improving , noticed blisters crusting on the penis  ENDOCRINE: Denies nocturia or thyroid problems. HEMATOLOGIC AND LYMPHATIC: Denies easy bruising or bleeding. SKIN: Denies rash or lesion. MUSCULOSKELETAL: Denies pain in neck, back, shoulder, knees, hips or arthritic symptoms.  NEUROLOGIC: Denies paralysis, paresthesias.  PSYCHIATRIC: Denies anxiety or depressive symptoms.  VITAL SIGNS:  Blood pressure 99/65, pulse (!) 51, temperature 98.2 F (36.8 C), temperature source Oral, resp. rate 16, height 6\' 1"  (1.854 m), weight 66.6 kg, SpO2 97 %.  I/O:    Intake/Output Summary (Last 24 hours) at 06/19/2018 1216 Last data filed at 06/19/2018 0939 Gross per 24 hour  Intake 2473.87 ml  Output 3655 ml  Net -1181.13 ml    PHYSICAL EXAMINATION:  GENERAL:  32 y.o.-year-old patient lying in the bed with no acute distress.  EYES: Pupils equal, round, reactive to light and accommodation. No scleral icterus. Extraocular muscles intact.  HEENT: Head atraumatic, normocephalic. Oropharynx and nasopharynx clear.  NECK:   Supple, no jugular venous distention. No thyroid enlargement, no tenderness.  LUNGS: Normal breath sounds bilaterally, no wheezing, rales,rhonchi or crepitation. No use of accessory muscles of respiration.  CARDIOVASCULAR: S1, S2 normal. No murmurs, rubs, or gallops.  ABDOMEN: Soft, non-tender, non-distended. Bowel sounds present. No organomegaly or mass.  EXTREMITIES: No pedal edema, cyanosis, or clubbing.  NEUROLOGIC: Cranial nerves II through XII are intact. Muscle strength 5/5 in all extremities. Sensation intact. Gait not checked.  PSYCHIATRIC: The patient is alert and oriented x 3.  SKIN: No obvious rash, lesion, or ulcer.   DATA REVIEW:   CBC Recent Labs  Lab 06/18/18 0440  WBC 6.0  HGB 12.1*  HCT 34.1*  PLT 208    Chemistries  Recent Labs  Lab 06/18/18 0440 06/19/18 0957  NA 139  --   K 4.0  --   CL 104  --   CO2 28  --   GLUCOSE 109*  --   BUN 6  --   CREATININE 0.68 0.74  CALCIUM 8.6*  --     Cardiac Enzymes No results for input(s): TROPONINI in the last 168 hours.  Microbiology Results  Results for orders placed or performed during the hospital encounter of 06/15/18  Urine culture     Status: None   Collection Time: 06/15/18  2:05 PM  Result Value Ref Range Status   Specimen Description   Final    URINE, RANDOM Performed at [facility], [address], [place], [place] [address]    Special Requests   Final    NONE Performed at [facility], [address] [place], [place] [address]    Culture   Final    NO GROWTH Performed at [facility], [address] [place], [place] [address]    Report Status 06/17/2018 FINAL  Final  Chlamydia/NGC rt PCR ([facility] only)     Status: None   Collection Time: 06/15/18  2:05 PM  Result Value Ref Range Status   Specimen source GC/Chlam URINE, RANDOM  Corrected    Comment: CORRECTED ON 08/08 AT 9147: PREVIOUSLY REPORTED AS CHLAMYDIA SPECIES   Chlamydia Tr NOT DETECTED NOT DETECTED Final    N gonorrhoeae NOT DETECTED NOT DETECTED Final    Comment: (NOTE) This CT/NG assay has not been evaluated in patients with a history of  hysterectomy. Performed at [facility], [address] [place], [place] [address]   Culture, blood (routine x 2)     Status: None (Preliminary result)   Collection Time: 06/15/18  2:05 PM  Result Value Ref Range Status   Specimen Description BLOOD RIGHT ANTECUBITAL  Final   Special Requests   Final    BOTTLES DRAWN AEROBIC AND ANAEROBIC Blood Culture results may not be optimal due to an excessive volume of blood received in culture  bottles   Culture   Final    NO GROWTH 4 DAYS Performed at [facility], [address] [place], [place] [address]    Report Status PENDING  Incomplete  Culture, blood (routine x 2)     Status: None (Preliminary result)   Collection Time: 06/15/18  2:05 PM  Result Value Ref Range Status   Specimen Description BLOOD RT FOREARM  Final   Special Requests   Final    BOTTLES DRAWN AEROBIC AND ANAEROBIC Blood Culture adequate volume   Culture   Final    NO GROWTH 4 DAYS Performed at [facility], [address] [place], [place] [address]    Report Status PENDING  Incomplete    RADIOLOGY:  No results found.  EKG:   Orders placed or performed during the hospital encounter of 10/04/16  . ED EKG  . ED EKG      Management plans discussed with the patient, family and they are in agreement.  CODE STATUS:     Code Status Orders  (From admission, onward)         Start     Ordered   06/15/18 2015  Full code  Continuous     06/15/18 2015        Code Status History    Date Active Date Inactive Code Status Order ID Comments User Context   09/24/2016 0429 09/25/2016 1406 Full Code [id number]  Arnaldo Natal, MD Inpatient      TOTAL TIME TAKING CARE OF THIS PATIENT:  45  minutes.   Note: This dictation was prepared with Dragon dictation along with smaller phrase  technology. Any transcriptional errors that result from this process are unintentional.   @[facility] @  on 06/19/2018 at 12:16 PM  Between 7am to 6pm - Pager - [phone]  After 6pm go to [email or web address] - password [email or web address]  [facility]  Office  [phone]  CC: Primary care physician; Patient, No Pcp Per

## 2018-06-19 NOTE — Care Management (Signed)
Patient discharging on high price HIV medication. Patient expected follow up from ID specialist who has documented that their office will call and make follow up appointment on Monday. Application to medication management was previously given and patient will go tomorrow to obtain medication. Script given to patient. Has already received daily dose for medication. Buddy DutyJosh Simser RN BSN RNCM [phone]

## 2018-06-20 LAB — CULTURE, BLOOD (ROUTINE X 2)
Culture: NO GROWTH
Culture: NO GROWTH
Special Requests: ADEQUATE

## 2018-06-20 LAB — HERPES SIMPLEX VIRUS(HSV) DNA BY PCR
HSV 1 DNA: NEGATIVE
HSV 2 DNA: NEGATIVE

## 2018-07-28 ENCOUNTER — Ambulatory Visit: Payer: Self-pay | Admitting: Infectious Diseases

## 2019-03-20 ENCOUNTER — Emergency Department: Payer: Self-pay

## 2019-03-20 ENCOUNTER — Emergency Department
Admission: EM | Admit: 2019-03-20 | Discharge: 2019-03-20 | Disposition: A | Discharge status: Home / Self Care | Payer: Self-pay | Attending: Emergency Medicine | Admitting: Emergency Medicine

## 2019-03-20 DIAGNOSIS — Z79899 Other long term (current) drug therapy: Secondary | ICD-10-CM | POA: Insufficient documentation

## 2019-03-20 DIAGNOSIS — L559 Sunburn, unspecified: Secondary | ICD-10-CM | POA: Insufficient documentation

## 2019-03-20 DIAGNOSIS — Z21 Asymptomatic human immunodeficiency virus [HIV] infection status: Secondary | ICD-10-CM | POA: Insufficient documentation

## 2019-03-20 DIAGNOSIS — R220 Localized swelling, mass and lump, head: Secondary | ICD-10-CM

## 2019-03-20 IMAGING — CT CT HEAD WITHOUT CONTRAST
4 of 8 series · 16 of 47 positions shown, 17 images · non-contrast
Comparison: [DATE], [DATE]

CLINICAL DATA: 32-year-old male with forehead swelling

EXAM:
CT HEAD WITHOUT CONTRAST
CT CERVICAL SPINE WITHOUT CONTRAST
TECHNIQUE: Multidetector CT imaging of the head and cervical spine was
performed following the standard protocol without intravenous
contrast. Multiplanar CT image reconstructions of the cervical spine
were also generated.

[Series 2: head wo · axial · 0.47mm/px · z∈[-157,+3]mm · 3 of 33 slices shown, 4 images]
[im 1/33  brain]
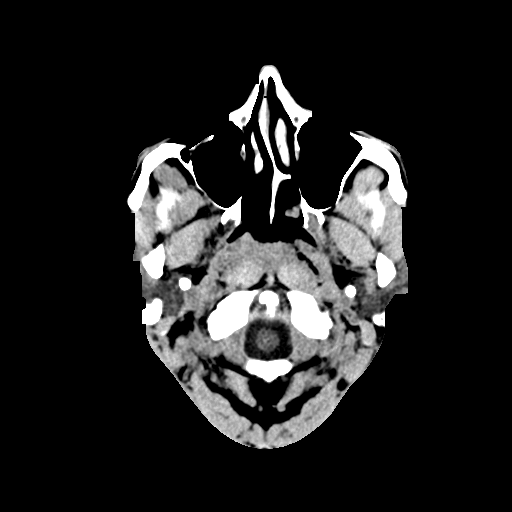
[im 1/33  bone]
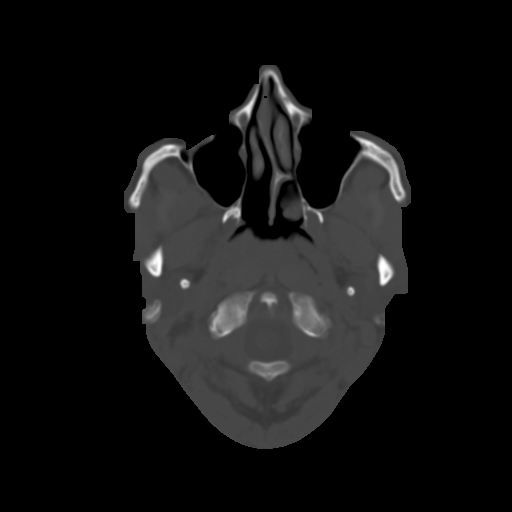
[im 17/33  brain]
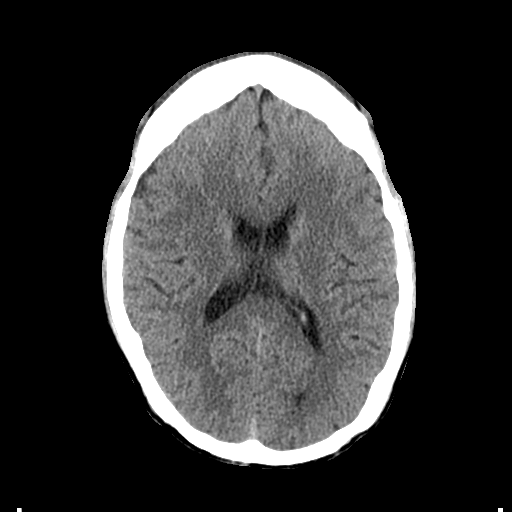
[im 33/33  brain]
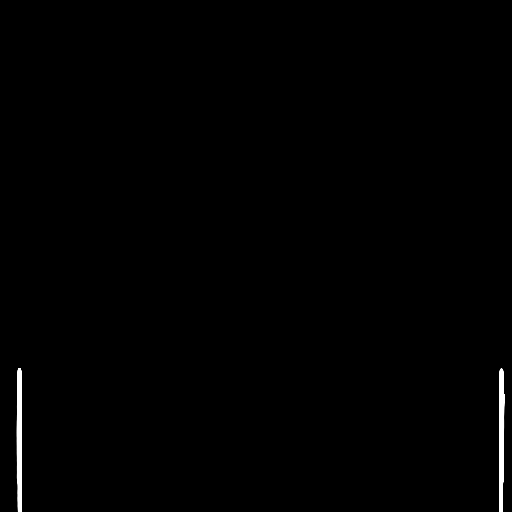

[Series 4: coronal soft tissue · coronal · 0.33mm/px · 3 of 71 slices shown]
[im 15/71  brain]
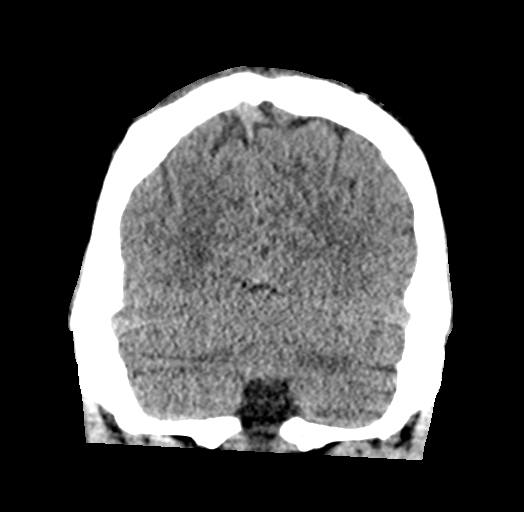
[im 29/71  brain]
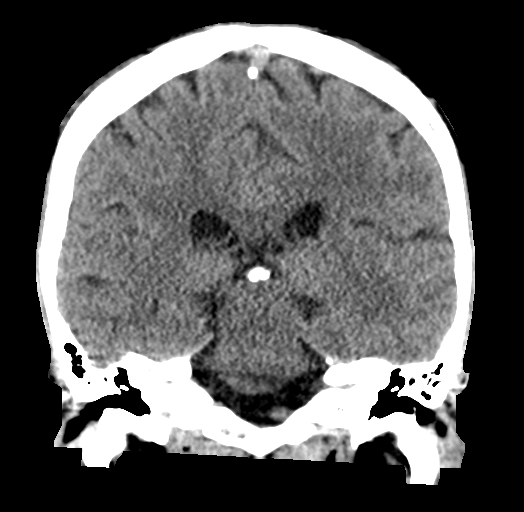
[im 43/71  brain]
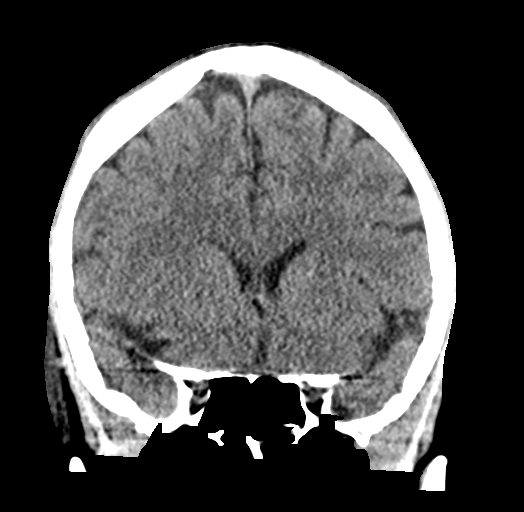

[Series 5: sagittal soft tissue · sagittal · 0.33mm/px · 2 of 59 slices shown]
[im 20/59  brain]
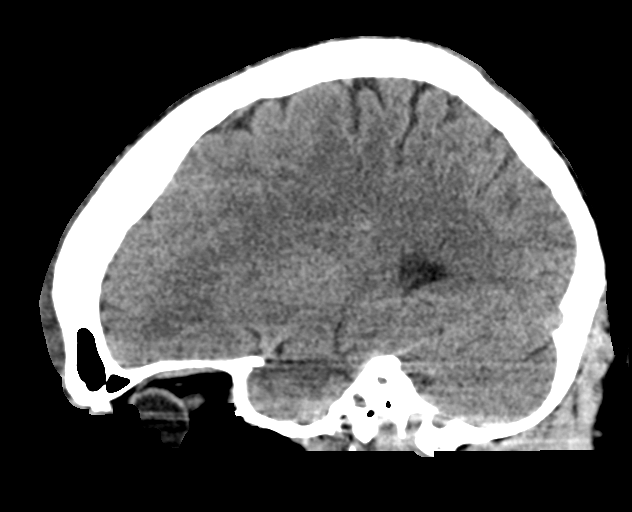
[im 39/59  brain]
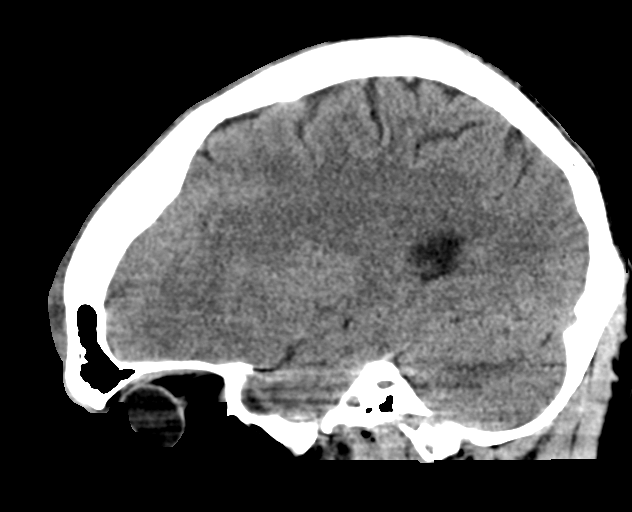

[Series 12: orthogonal bone · axial · 0.21mm/px · z∈[-332,-188]mm · 8 of 101 slices shown]
[im 12/101  bone]
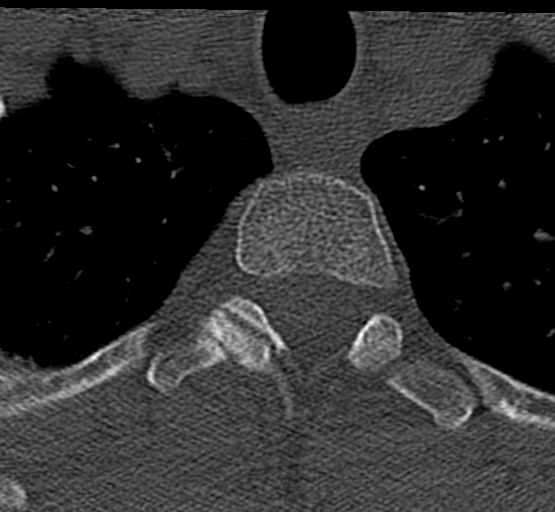
[im 23/101  bone]
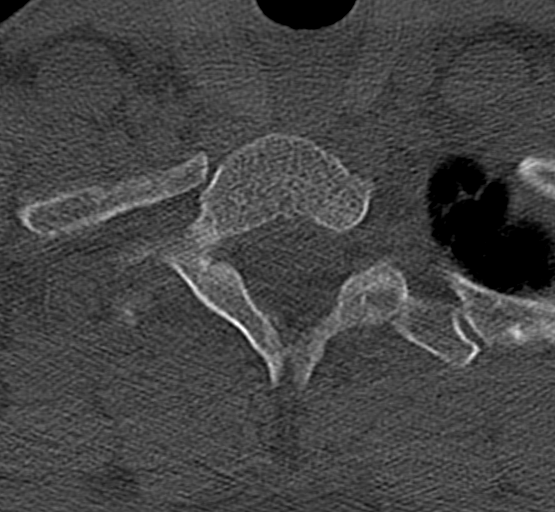
[im 34/101  bone]
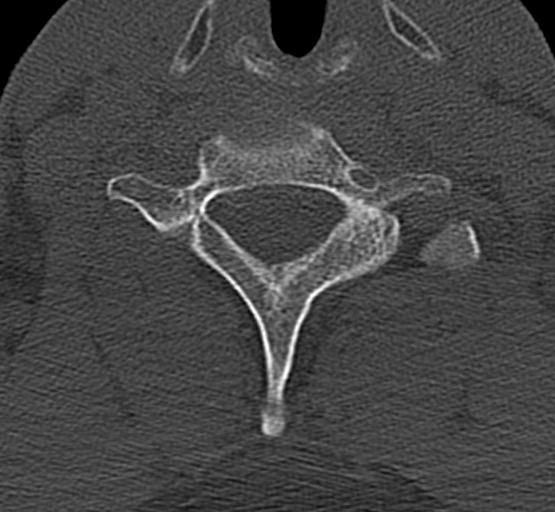
[im 45/101  bone]
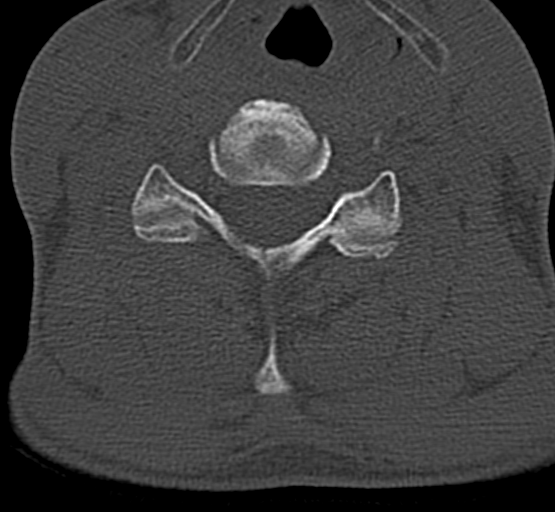
[im 56/101  bone]
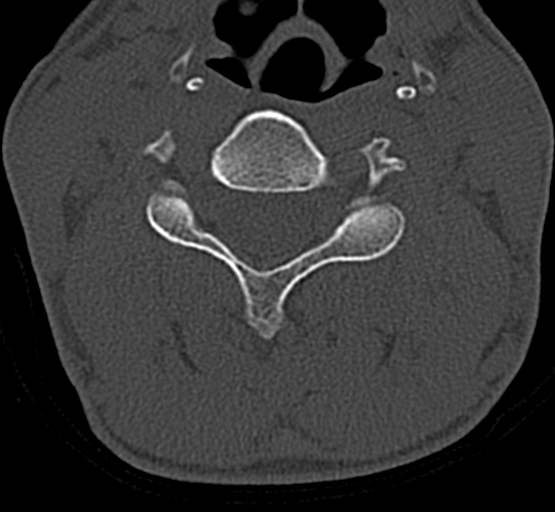
[im 67/101  bone]
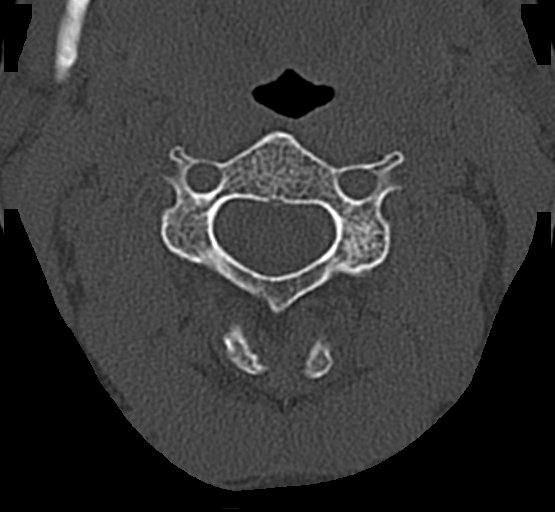
[im 78/101  bone]
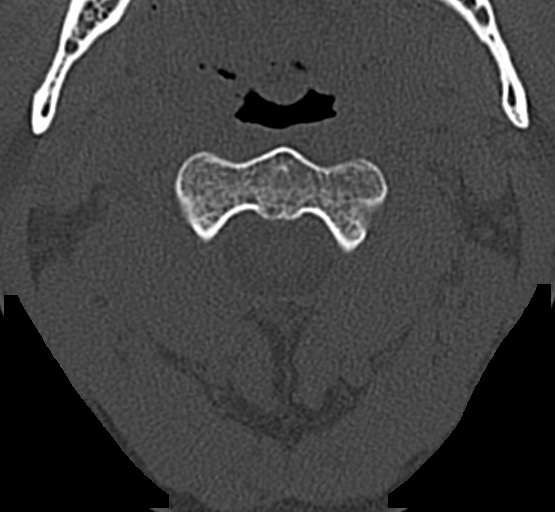
[im 89/101  bone]
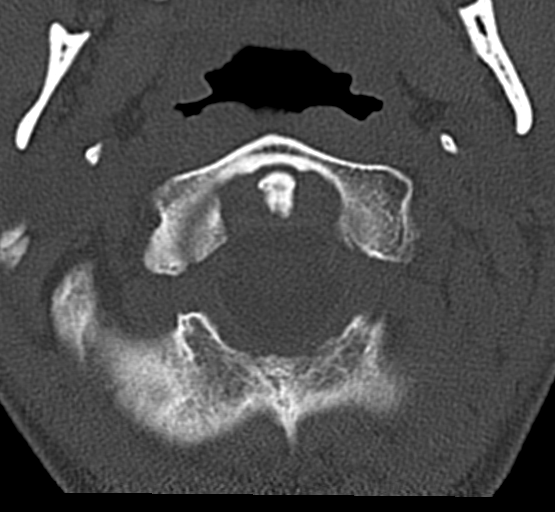

[16 of 47 positions shown; findings below may reference images not displayed]

FINDINGS: CT HEAD FINDINGS

Brain: No acute intracranial hemorrhage. No midline shift or mass
effect. Gray-white differentiation maintained. Unremarkable
appearance of the ventricular system.

Vascular: Unremarkable.

Skull: Low-density skin and soft tissue thickening of the frontal
scalp extending over the right temporal region. Greatest thickness
overlying the right temporal region measures 14 mm in thickness.
Hounsfield units measure 23, with no focal hyperdensity to suggest
hemorrhage. No underlying skull fracture or radiopaque foreign body.

Sinuses/Orbits: Unremarkable appearance of the orbits. Mastoid air
cells clear. No middle ear effusion. No significant sinus disease.

Other: None

CT CERVICAL SPINE FINDINGS

Alignment: Craniocervical junction aligned. Anatomic alignment of
the cervical elements. No subluxation.

Skull base and vertebrae: No acute fracture at the skullbase.
Vertebral body heights relatively maintained. No acute fracture
identified.

Soft tissues and spinal canal: Unremarkable cervical soft tissues.
Lymph nodes are present, though not enlarged.

Disc levels: Unremarkable appearance of disc space, which are
maintained.

Upper chest: Unremarkable appearance of the lung apices.

Other: No bony canal narrowing.
IMPRESSION: Head CT:

No acute intracranial abnormality.

Diffuse low-density soft tissue thickening of the frontal scalp and
the right temporal scalp suggesting edema with no evidence of
hemorrhage or radiopaque foreign body. No underlying fracture.

Cervical CT:

No acute fracture or malalignment of the cervical spine.

## 2019-03-20 MED ORDER — CEPHALEXIN 500 MG PO CAPS
500.0000 mg | ORAL_CAPSULE | Freq: Once | ORAL | Status: AC
  Administered 2019-03-20: 500 mg via ORAL
  Filled 2019-03-20: qty 1

## 2019-03-20 MED ORDER — CEPHALEXIN 500 MG PO CAPS: 500.0000 mg | ORAL_CAPSULE | Freq: Four times a day (QID) | ORAL | 0 refills | Status: AC

## 2019-03-20 NOTE — Discharge Instructions (Signed)
Your head CT and neck CT are reassuring.  I suspect that the swelling is due to your bad sunburn.  I am giving you an antibiotic prescription to cover you for an infection.  Please return to the emergency department if symptoms change or worsen.
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
## 2019-03-30 ENCOUNTER — Emergency Department
Admission: EM | Admit: 2019-03-30 | Discharge: 2019-03-31 | Disposition: A | Discharge status: Rehabilitation Facility | Payer: Self-pay | Attending: Emergency Medicine | Admitting: Emergency Medicine

## 2019-03-30 DIAGNOSIS — F17210 Nicotine dependence, cigarettes, uncomplicated: Secondary | ICD-10-CM | POA: Insufficient documentation

## 2019-03-30 DIAGNOSIS — Z21 Asymptomatic human immunodeficiency virus [HIV] infection status: Secondary | ICD-10-CM | POA: Insufficient documentation

## 2019-03-30 DIAGNOSIS — F10920 Alcohol use, unspecified with intoxication, uncomplicated: Secondary | ICD-10-CM | POA: Insufficient documentation

## 2019-03-30 LAB — CBC WITH DIFFERENTIAL/PLATELET
Abs Immature Granulocytes: 0.01 10*3/uL (ref 0.00–0.07)
Basophils Absolute: 0 10*3/uL (ref 0.0–0.1)
Basophils Relative: 1 %
Eosinophils Absolute: 0.2 10*3/uL (ref 0.0–0.5)
Eosinophils Relative: 5 %
HCT: 39.9 % (ref 39.0–52.0)
Hemoglobin: 13.8 g/dL (ref 13.0–17.0)
Immature Granulocytes: 0 %
Lymphocytes Relative: 38 %
Lymphs Abs: 1.4 10*3/uL (ref 0.7–4.0)
MCH: 32.1 pg (ref 26.0–34.0)
MCHC: 34.6 g/dL (ref 30.0–36.0)
MCV: 92.8 fL (ref 80.0–100.0)
Monocytes Absolute: 0.3 10*3/uL (ref 0.1–1.0)
Monocytes Relative: 8 %
Neutro Abs: 1.8 10*3/uL (ref 1.7–7.7)
Neutrophils Relative %: 48 %
Platelets: 120 10*3/uL — ABNORMAL LOW (ref 150–400)
RBC: 4.3 MIL/uL (ref 4.22–5.81)
RDW: 14.8 % (ref 11.5–15.5)
WBC: 3.7 10*3/uL — ABNORMAL LOW (ref 4.0–10.5)
nRBC: 0 % (ref 0.0–0.2)

## 2019-03-30 LAB — COMPREHENSIVE METABOLIC PANEL
ALT: 164 U/L — ABNORMAL HIGH (ref 0–44)
AST: 126 U/L — ABNORMAL HIGH (ref 15–41)
Albumin: 4.3 g/dL (ref 3.5–5.0)
Alkaline Phosphatase: 52 U/L (ref 38–126)
Anion gap: 11 (ref 5–15)
BUN: 9 mg/dL (ref 6–20)
CO2: 25 mmol/L (ref 22–32)
Calcium: 9 mg/dL (ref 8.9–10.3)
Chloride: 102 mmol/L (ref 98–111)
Creatinine, Ser: 0.65 mg/dL (ref 0.61–1.24)
GFR calc Af Amer: >60 mL/min (ref >60)
GFR calc non Af Amer: >60 mL/min (ref >60)
Glucose, Bld: 136 mg/dL — ABNORMAL HIGH (ref 70–99)
Potassium: 3.7 mmol/L (ref 3.5–5.1)
Sodium: 138 mmol/L (ref 135–145)
Total Bilirubin: 0.5 mg/dL (ref 0.3–1.2)
Total Protein: 7.5 g/dL (ref 6.5–8.1)

## 2019-03-30 LAB — ACETAMINOPHEN LEVEL: Acetaminophen (Tylenol), Serum: <10 ug/mL — ABNORMAL LOW (ref 10–30)

## 2019-03-30 LAB — LIPASE, BLOOD: Lipase: 68 U/L — ABNORMAL HIGH (ref 11–51)

## 2019-03-30 LAB — ETHANOL: Alcohol, Ethyl (B): 272 mg/dL — ABNORMAL HIGH (ref <10)

## 2019-03-30 LAB — SALICYLATE LEVEL: Salicylate Lvl: <7 mg/dL (ref 2.8–30.0)

## 2019-03-30 MED ORDER — LORAZEPAM 2 MG/ML IJ SOLN
0.5000 mg | Freq: Once | INTRAMUSCULAR | Status: AC
  Administered 2019-03-30: 0.5 mg via INTRAVENOUS
  Filled 2019-03-30: qty 1

## 2019-03-30 MED ORDER — THIAMINE HCL 100 MG/ML IJ SOLN
Freq: Once | INTRAVENOUS | Status: AC
  Administered 2019-03-30: 20:00:00 via INTRAVENOUS
  Filled 2019-03-30: qty 1000

## 2019-03-30 NOTE — BH Assessment (Addendum)
TTS has been notified that pt is in 19 H in search of detox services. This Clinical research associate is currently on the phone with [facility] in search of a bed.   Update: [facility] does not have any male beds available. Pt's BAL is over a .2 and can not be accepted once beds are available until he is under a .2  This Clinical research associate will take the pt several outpatient resources for him to explore.
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
## 2019-03-30 NOTE — ED Provider Notes (Signed)
[facility] Emergency Department Provider Note   ____________________________________________   First MD Initiated Contact with Patient 03/30/19 1733     (approximate)  I have reviewed the triage vital signs and the nursing notes.   HISTORY  Chief Complaint Altered Mental Status (i have been drinking alcohol for the past 3 weeks and now i want to get clean )    HPI Gary [name] is a 33 y.o. male patient says he usually drinks 340 ounce beers a day.  Sometimes up to 5.  He wants to get off of the alcohol.  He was trying to drink just a little bit of beer but he could not get the tremors to stop.  Comes in here to be seen and get help.  Currently he is sleepy but easily arousable.  He is not shaking when he is resting.  After I talked to him for a while he began shaking.  He is not tachycardic.  He is not sweating and his pupils are midposition.  He has no goosebumps.  He is not nauseated or vomiting.         Past Medical History:  Diagnosis Date  . HIV (human immunodeficiency virus infection) [facility](HCC)     Patient Active Problem List   Diagnosis Date Noted  . HIV disease (HCC) 06/16/2018  . Polysubstance abuse (HCC) 06/16/2018  . Cigarette smoker 06/16/2018  . Penile cellulitis 06/15/2018    Past Surgical History:  Procedure Laterality Date  . INCISION AND DRAINAGE PERIRECTAL ABSCESS N/A 09/24/2016   Procedure: IRRIGATION AND DEBRIDEMENT PERIRECTAL ABSCESS;  Surgeon: Ricarda Frameharles Woodham, MD;  Location: [facility] ORS;  Service: General;  Laterality: N/A;  . none      Prior to Admission medications   Not on File    Allergies Caffeine  Family History  Problem Relation Age of Onset  . Alcohol abuse Mother   . Alcohol abuse Father     Social History Social History   Tobacco Use  . Smoking status: Current Every Day Smoker    Packs/day: 0.50    Types: Cigarettes  . Smokeless tobacco: Never Used  Substance Use Topics  . Alcohol use: Yes   Comment: 18pk/day  . Drug use: Yes    Types: Marijuana, Methamphetamines    Review of Systems  Constitutional: No fever/chills Eyes: No visual changes. ENT: No sore throat. Cardiovascular: Denies chest pain. Respiratory: Denies shortness of breath. Gastrointestinal: No abdominal pain.  No nausea, no vomiting.  No diarrhea.  No constipation. Genitourinary: Negative for dysuria. Musculoskeletal: Negative for back pain. Skin: Negative for rash. Neurological: Negative for headaches, focal weakness   ____________________________________________   PHYSICAL EXAM:  VITAL SIGNS: ED Triage Vitals  Enc Vitals Group     BP 03/30/19 1723 130/82     Pulse Rate 03/30/19 1723 82     Resp 03/30/19 1723 18     Temp 03/30/19 1723 98.2 F (36.8 C)     Temp Source 03/30/19 1723 Oral     SpO2 03/30/19 1709 100 %     Weight --      Height --      Head Circumference --      Peak Flow --      Pain Score --      Pain Loc --      Pain Edu? --      Excl. in GC? --     Constitutional: Sleeping but easily arousable.  When he is aroused he is  alert and oriented. Well appearing and in no acute distress. Eyes: Conjunctivae are normal. PER. EOMI. Head: Atraumatic. Nose: No congestion/rhinnorhea. Mouth/Throat: Mucous membranes are moist.  Oropharynx non-erythematous. Neck: No stridor.   Cardiovascular: Normal rate, regular rhythm. Grossly normal heart sounds.  Good peripheral circulation. Respiratory: Normal respiratory effort.  No retractions. Lungs CTAB. Gastrointestinal: Soft and nontender. No distention. No abdominal bruits. No CVA tenderness. Musculoskeletal: No lower extremity tenderness nor edema.  Neurologic:  Normal speech and language. No gross focal neurologic deficits are appreciated.  Skin:  Skin is warm, dry and intact. No rash noted.   ____________________________________________   LABS (all labs ordered are listed, but only abnormal results are displayed)  Labs Reviewed   ACETAMINOPHEN LEVEL - Abnormal; Notable for the following components:      Result Value   Acetaminophen (Tylenol), Serum <10 (*)    All other components within normal limits  COMPREHENSIVE METABOLIC PANEL - Abnormal; Notable for the following components:   Glucose, Bld 136 (*)    AST 126 (*)    ALT 164 (*)    All other components within normal limits  ETHANOL - Abnormal; Notable for the following components:   Alcohol, Ethyl (B) 272 (*)    All other components within normal limits  LIPASE, BLOOD - Abnormal; Notable for the following components:   Lipase 68 (*)    All other components within normal limits  CBC WITH DIFFERENTIAL/PLATELET - Abnormal; Notable for the following components:   WBC 3.7 (*)    Platelets 120 (*)    All other components within normal limits  SALICYLATE LEVEL  URINALYSIS, COMPLETE (UACMP) WITH MICROSCOPIC  URINE DRUG SCREEN, QUALITATIVE ([facility] ONLY)   ____________________________________________  EKG   ____________________________________________  RADIOLOGY  ED MD interpretation:   Official radiology report(s): No results found.  ____________________________________________   PROCEDURES  Procedure(s) performed (including Critical Care):  Procedures   ____________________________________________   INITIAL IMPRESSION / ASSESSMENT AND PLAN / ED COURSE  Patient asked for something to help with his early withdrawal symptoms.  He got a half a milligram of Ativan.  He is now sleeping comfortably.  TTS came by to see him but he was sleeping.  They provided an envelope with some resources.  [facility] does not have a bed tonight.  Currently there are beds in the ER.  I will leave him here overnight we can try [facility] or possibly other alternatives in the morning as long as he wants to continue working on it.  We will monitor him and keep him on [place] protocol.              ____________________________________________   FINAL CLINICAL IMPRESSION(S)  / ED DIAGNOSES  Final diagnoses:  Alcoholic intoxication without complication [facility])     ED Discharge Orders    None       Note:  This document was prepared using Dragon voice recognition software and may include unintentional dictation errors.    Arnaldo Natal, MD 03/31/19 [phone]

## 2019-03-31 LAB — URINALYSIS, COMPLETE (UACMP) WITH MICROSCOPIC
Bacteria, UA: NONE SEEN
Bilirubin Urine: NEGATIVE
Glucose, UA: NEGATIVE mg/dL
Hgb urine dipstick: NEGATIVE
Ketones, ur: NEGATIVE mg/dL
Leukocytes,Ua: NEGATIVE
Nitrite: NEGATIVE
Protein, ur: NEGATIVE mg/dL
Specific Gravity, Urine: 1.017 (ref 1.005–1.030)
Squamous Epithelial / LPF: NONE SEEN (ref 0–5)
pH: 6 (ref 5.0–8.0)

## 2019-03-31 LAB — URINE DRUG SCREEN, QUALITATIVE (ARMC ONLY)
Amphetamines, Ur Screen: NOT DETECTED
Barbiturates, Ur Screen: NOT DETECTED
Benzodiazepine, Ur Scrn: NOT DETECTED
Cannabinoid 50 Ng, Ur ~~LOC~~: NOT DETECTED
Cocaine Metabolite,Ur ~~LOC~~: NOT DETECTED
MDMA (Ecstasy)Ur Screen: NOT DETECTED
Methadone Scn, Ur: NOT DETECTED
Opiate, Ur Screen: NOT DETECTED
Phencyclidine (PCP) Ur S: NOT DETECTED
Tricyclic, Ur Screen: NOT DETECTED

## 2019-03-31 LAB — ETHANOL: Alcohol, Ethyl (B): <10 mg/dL (ref <10)

## 2019-03-31 MED ORDER — LORAZEPAM 1 MG PO TABS
1.0000 mg | ORAL_TABLET | Freq: Once | ORAL | Status: AC
  Administered 2019-03-31: 1 mg via ORAL
  Filled 2019-03-31: qty 1

## 2019-03-31 MED ORDER — LORAZEPAM 2 MG/ML IJ SOLN
INTRAMUSCULAR | Status: AC
  Filled 2019-03-31: qty 1

## 2019-03-31 MED ORDER — LORAZEPAM 2 MG/ML IJ SOLN
1.0000 mg | Freq: Once | INTRAMUSCULAR | Status: AC
  Administered 2019-03-31: 1 mg via INTRAVENOUS

## 2019-03-31 NOTE — ED Notes (Signed)
Pt discharged to [facility] via [facility]. VS stable. All belongings sent with patient. Pt denies SI/HI.

## 2019-03-31 NOTE — ED Provider Notes (Addendum)
Patient going to residential treatment services.  Well-appearing, vitals normal   Jene Every, MD 03/31/19 1016    Jene Every, MD 03/31/19 1017    Notified by [facility] that unless we can get the patient there in 30 minutes they will not take him today...   Jene Every, MD 03/31/19 1029

## 2019-03-31 NOTE — ED Notes (Signed)
Pt will be discharged to [facility].

## 2019-03-31 NOTE — ED Notes (Signed)
Pt given 1 mg Ativan PO as order by EDP for withdrawal.  Ethtanol level re-drawn and sent to lab.

## 2019-04-02 ENCOUNTER — Emergency Department
Admission: EM | Admit: 2019-04-02 | Discharge: 2019-04-03 | Disposition: A | Discharge status: Home / Self Care | Payer: Self-pay | Attending: Emergency Medicine | Admitting: Emergency Medicine

## 2019-04-02 ENCOUNTER — Other Ambulatory Visit: Payer: Self-pay

## 2019-04-02 DIAGNOSIS — Z21 Asymptomatic human immunodeficiency virus [HIV] infection status: Secondary | ICD-10-CM | POA: Insufficient documentation

## 2019-04-02 DIAGNOSIS — F10920 Alcohol use, unspecified with intoxication, uncomplicated: Secondary | ICD-10-CM

## 2019-04-02 DIAGNOSIS — F17210 Nicotine dependence, cigarettes, uncomplicated: Secondary | ICD-10-CM | POA: Insufficient documentation

## 2019-04-02 LAB — CBC
HCT: 38.8 % — ABNORMAL LOW (ref 39.0–52.0)
Hemoglobin: 13.2 g/dL (ref 13.0–17.0)
MCH: 32 pg (ref 26.0–34.0)
MCHC: 34 g/dL (ref 30.0–36.0)
MCV: 93.9 fL (ref 80.0–100.0)
Platelets: 128 10*3/uL — ABNORMAL LOW (ref 150–400)
RBC: 4.13 MIL/uL — ABNORMAL LOW (ref 4.22–5.81)
RDW: 15.4 % (ref 11.5–15.5)
WBC: 3.9 10*3/uL — ABNORMAL LOW (ref 4.0–10.5)
nRBC: 0 % (ref 0.0–0.2)

## 2019-04-02 LAB — COMPREHENSIVE METABOLIC PANEL
ALT: 92 U/L — ABNORMAL HIGH (ref 0–44)
AST: 59 U/L — ABNORMAL HIGH (ref 15–41)
Albumin: 4.5 g/dL (ref 3.5–5.0)
Alkaline Phosphatase: 53 U/L (ref 38–126)
Anion gap: 9 (ref 5–15)
BUN: 9 mg/dL (ref 6–20)
CO2: 21 mmol/L — ABNORMAL LOW (ref 22–32)
Calcium: 8.7 mg/dL — ABNORMAL LOW (ref 8.9–10.3)
Chloride: 110 mmol/L (ref 98–111)
Creatinine, Ser: 0.58 mg/dL — ABNORMAL LOW (ref 0.61–1.24)
GFR calc Af Amer: >60 mL/min (ref >60)
GFR calc non Af Amer: >60 mL/min (ref >60)
Glucose, Bld: 101 mg/dL — ABNORMAL HIGH (ref 70–99)
Potassium: 3.8 mmol/L (ref 3.5–5.1)
Sodium: 140 mmol/L (ref 135–145)
Total Bilirubin: 0.3 mg/dL (ref 0.3–1.2)
Total Protein: 7.5 g/dL (ref 6.5–8.1)

## 2019-04-02 LAB — URINE DRUG SCREEN, QUALITATIVE (ARMC ONLY)
Amphetamines, Ur Screen: NOT DETECTED
Barbiturates, Ur Screen: NOT DETECTED
Benzodiazepine, Ur Scrn: NOT DETECTED
Cannabinoid 50 Ng, Ur ~~LOC~~: NOT DETECTED
Cocaine Metabolite,Ur ~~LOC~~: NOT DETECTED
MDMA (Ecstasy)Ur Screen: NOT DETECTED
Methadone Scn, Ur: NOT DETECTED
Opiate, Ur Screen: NOT DETECTED
Phencyclidine (PCP) Ur S: NOT DETECTED
Tricyclic, Ur Screen: NOT DETECTED

## 2019-04-02 LAB — ETHANOL: Alcohol, Ethyl (B): 268 mg/dL — ABNORMAL HIGH (ref <10)

## 2019-04-02 NOTE — ED Notes (Signed)
Hourly rounding reveals patient in room. No complaints, stable, in no acute distress. Q15 minute rounds and monitoring via Rover and Officer to continue.   

## 2019-04-02 NOTE — ED Triage Notes (Signed)
Patient reports he is seeking help to stop drinking alcohol.  Reports drinks 3 (40 oz) a day occasional more.  Reports last drink just prior to arrival.

## 2019-04-02 NOTE — ED Notes (Signed)
Pt. Transferred from Triage to [facility] after dressing out and screening for contraband. Report to include Situation, Background, Assessment and Recommendations from triageRN. Pt. Oriented to [place] including Q15 minute rounds as well as Psychologist, counselling for their protection. Patient is alert and oriented, warm and dry in no acute distress. Patient denies SI, HI, and AVH. Patient admitted to drinking alcohol and wants help for detox. Pt. Encouraged to let me know if needs arise.

## 2019-04-02 NOTE — ED Notes (Signed)
TTS has spoken with [facility] who states that they do currently have male detox beds available although due to staffing they are unable to admit at this time. [facility] will begin accepting patients in the morning. TTS will forward the pts labs and clinical  information to [facility] for review.  

## 2019-04-02 NOTE — ED Notes (Addendum)
Pt changed into scrubs and belongings secured. Dark tan coat, shoes, black socks, brown pants, red golf shirt, brown belt. Back pack with phone charger, candy, hand warmers.

## 2019-04-02 NOTE — ED Provider Notes (Signed)
[facility] Emergency Department Provider Note       Time seen: ----------------------------------------- 10:33 PM on 04/02/2019 -----------------------------------------   I have reviewed the triage vital signs and the nursing notes.  HISTORY   Chief Complaint Detox   HPI Gary [name] is a 33 y.o. male with a history of HIV, polysubstance abuse who presents to the ED for detox.  Patient states he is seeking help to stop drinking alcohol.  He usually drinks 340 ounce beers a day and occasionally more.  Last drink was just prior to arrival.  He denies any recent illness or pain.  Past Medical History:  Diagnosis Date  . HIV (human immunodeficiency virus infection) [facility](HCC)     Patient Active Problem List   Diagnosis Date Noted  . HIV disease (HCC) 06/16/2018  . Polysubstance abuse (HCC) 06/16/2018  . Cigarette smoker 06/16/2018  . Penile cellulitis 06/15/2018    Past Surgical History:  Procedure Laterality Date  . INCISION AND DRAINAGE PERIRECTAL ABSCESS N/A 09/24/2016   Procedure: IRRIGATION AND DEBRIDEMENT PERIRECTAL ABSCESS;  Surgeon: Ricarda Frameharles Woodham, MD;  Location: [facility] ORS;  Service: General;  Laterality: N/A;  . none      Allergies Caffeine  Social History Social History   Tobacco Use  . Smoking status: Current Every Day Smoker    Packs/day: 0.50    Types: Cigarettes  . Smokeless tobacco: Never Used  Substance Use Topics  . Alcohol use: Yes    Comment: 18pk/day  . Drug use: Yes    Types: Marijuana, Methamphetamines   Review of Systems Constitutional: Negative for fever. Cardiovascular: Negative for chest pain. Respiratory: Negative for shortness of breath. Gastrointestinal: Negative for abdominal pain, vomiting and diarrhea. Musculoskeletal: Negative for back pain. Skin: Negative for rash. Neurological: Negative for headaches, focal weakness or numbness. Psychiatric: Positive for alcohol use   All systems  negative/normal/unremarkable except as stated in the HPI  ____________________________________________   PHYSICAL EXAM:  VITAL SIGNS: ED Triage Vitals  Enc Vitals Group     BP 04/02/19 2139 113/62     Pulse Rate 04/02/19 2139 93     Resp 04/02/19 2139 15     Temp 04/02/19 2139 98.7 F (37.1 C)     Temp Source 04/02/19 2139 Oral     SpO2 04/02/19 2139 96 %     Weight --      Height --      Head Circumference --      Peak Flow --      Pain Score 04/02/19 2126 0     Pain Loc --      Pain Edu? --      Excl. in GC? --    Constitutional: Alert and oriented. Well appearing and in no distress. Eyes: Conjunctivae are normal. Normal extraocular movements. Cardiovascular: Normal rate, regular rhythm. No murmurs, rubs, or gallops. Respiratory: Normal respiratory effort without tachypnea nor retractions. Breath sounds are clear and equal bilaterally. No wheezes/rales/rhonchi. Gastrointestinal: Soft and nontender. Normal bowel sounds Musculoskeletal: Nontender with normal range of motion in extremities. No lower extremity tenderness nor edema. Neurologic:  Normal speech and language. No gross focal neurologic deficits are appreciated.  Skin:  Skin is warm, dry and intact. No rash noted. Psychiatric: Mood and affect are normal. Speech and behavior are normal.   ____________________________________________  ED COURSE:  As part of my medical decision making, I reviewed the following data within the electronic MEDICAL RECORD NUMBER History obtained from family if available, nursing notes, old  chart and ekg, as well as notes from prior ED visits. Patient presented for detox, we will assess with labs as indicated at this time.   Procedures  Gary [name] was evaluated in Emergency Department on 04/02/2019 for the symptoms described in the history of present illness. He was evaluated in the context of the global COVID-19 pandemic, which necessitated consideration that the patient might be at risk  for infection with the SARS-CoV-2 virus that causes COVID-19. Institutional protocols and algorithms that pertain to the evaluation of patients at risk for COVID-19 are in a state of rapid change based on information released by regulatory bodies including the CDC and federal and state organizations. These policies and algorithms were followed during the patient's care in the ED.  ____________________________________________   LABS (pertinent positives/negatives)  Labs Reviewed  COMPREHENSIVE METABOLIC PANEL - Abnormal; Notable for the following components:      Result Value   CO2 21 (*)    Glucose, Bld 101 (*)    Creatinine, Ser 0.58 (*)    Calcium 8.7 (*)    AST 59 (*)    ALT 92 (*)    All other components within normal limits  ETHANOL - Abnormal; Notable for the following components:   Alcohol, Ethyl (B) 268 (*)    All other components within normal limits  CBC - Abnormal; Notable for the following components:   WBC 3.9 (*)    RBC 4.13 (*)    HCT 38.8 (*)    Platelets 128 (*)    All other components within normal limits  URINE DRUG SCREEN, QUALITATIVE ([facility] ONLY)   ___________________________________________   DIFFERENTIAL DIAGNOSIS   Alcohol abuse, substance abuse, depression  FINAL ASSESSMENT AND PLAN  Alcohol abuse   Plan: The patient had presented for detox. Patient's labs do indicate alcohol intoxication.  Patient appears medically clear for detox referral at this time.  Ulice Dash, MD    Note: This note was generated in part or whole with voice recognition software. Voice recognition is usually quite accurate but there are transcription errors that can and very often do occur. I apologize for any typographical errors that were not detected and corrected.     Emily Filbert, MD 04/02/19 2234

## 2019-04-03 LAB — ETHANOL: Alcohol, Ethyl (B): 54 mg/dL — ABNORMAL HIGH (ref <10)

## 2019-04-03 MED ORDER — FOLIC ACID 1 MG PO TABS
1.0000 mg | ORAL_TABLET | Freq: Every day | ORAL | Status: DC
  Administered 2019-04-03: 1 mg via ORAL
  Filled 2019-04-03: qty 1

## 2019-04-03 MED ORDER — THIAMINE HCL 100 MG/ML IJ SOLN: 100.0000 mg | Freq: Every day | INTRAMUSCULAR | Status: DC

## 2019-04-03 MED ORDER — VITAMIN B-1 100 MG PO TABS
100.0000 mg | ORAL_TABLET | Freq: Every day | ORAL | Status: DC
  Administered 2019-04-03: 09:00:00 100 mg via ORAL
  Filled 2019-04-03: qty 1

## 2019-04-03 MED ORDER — LORAZEPAM 0.5 MG PO TABS
0.5000 mg | ORAL_TABLET | Freq: Once | ORAL | Status: AC
  Administered 2019-04-03: 09:00:00 0.5 mg via ORAL
  Filled 2019-04-03: qty 1

## 2019-04-03 MED ORDER — LORAZEPAM 2 MG/ML IJ SOLN: 1.0000 mg | Freq: Four times a day (QID) | INTRAMUSCULAR | Status: DC | PRN

## 2019-04-03 MED ORDER — ADULT MULTIVITAMIN W/MINERALS CH
1.0000 | ORAL_TABLET | Freq: Every day | ORAL | Status: DC
  Administered 2019-04-03: 09:00:00 1 via ORAL
  Filled 2019-04-03: qty 1

## 2019-04-03 MED ORDER — LORAZEPAM 1 MG PO TABS
1.0000 mg | ORAL_TABLET | Freq: Four times a day (QID) | ORAL | Status: DC | PRN
  Administered 2019-04-03: 09:00:00 1 mg via ORAL
  Filled 2019-04-03: qty 1

## 2019-04-03 NOTE — ED Notes (Signed)
Hourly rounding reveals patient in room. No complaints, stable, in no acute distress. Q15 minute rounds and monitoring via Rover and Officer to continue.   

## 2019-04-03 NOTE — ED Notes (Signed)
TTS received a return call from [facility] stating that they do have beds available but the pt would have to be a self pay at the rate of $1100 a  Day.

## 2019-04-03 NOTE — ED Provider Notes (Signed)
Vitals:   04/03/19 0902 04/03/19 1434  BP: 119/71 119/71  Pulse: 82 77  Resp:  16  Temp:  98.7 F (37.1 C)  SpO2:  99%    Patient is fully alert resting comfortably in the hallway.  He does not appear to be in acute withdrawal at the time.  I offered him treatment including home Librium treatment, but the patient reports he just like to be able to go home and he plans to detox on his own.  I discussed with him that this can cause problems like seizures confusion or other complication, but he reports he wishes to go home and does not wish to stay any further for detox and does not want to accept a prescription.  He then tells me he intends to return to drinking but will slowly taper his drinking over time.  I again encouraged him to accept a prescription for medication and medical assistance therapy, but patient declined this  Return precautions and treatment recommendations and follow-up discussed with the patient who is agreeable with the plan.    Sharyn Creamer, MD 04/03/19 1438

## 2019-04-03 NOTE — ED Notes (Signed)
Patient discharged home, patient received discharge papers. Patient received belongings and verbalized he has received all of his belongings. Patient appropriate and cooperative, Denies SI/HI AVH. Vital signs taken. NAD noted. 

## 2019-04-03 NOTE — BH Assessment (Signed)
Pt has been denied by [facility] and [facility] for detox treatment.

## 2019-04-03 NOTE — BH Assessment (Signed)
Pt is unable to return to [facility] for 30 days since he was recently admitted to their facility for detox treatment (per Molly Maduro - Intake RN [phone]).

## 2019-04-16 ENCOUNTER — Inpatient Hospital Stay (HOSPITAL_COMMUNITY)
Admission: AD | Admit: 2019-04-16 | Discharge: 2019-04-17 | DRG: 885 | Disposition: A | Discharge status: Home / Self Care | Payer: Self-pay | Source: Intra-hospital | Attending: Psychiatry | Admitting: Psychiatry

## 2019-04-16 ENCOUNTER — Encounter: Payer: Self-pay | Admitting: Emergency Medicine

## 2019-04-16 ENCOUNTER — Emergency Department
Admission: EM | Admit: 2019-04-16 | Discharge: 2019-04-16 | Disposition: A | Discharge status: Other Acute Inpatient Hospital | Payer: Self-pay | Attending: Emergency Medicine | Admitting: Emergency Medicine

## 2019-04-16 ENCOUNTER — Other Ambulatory Visit: Payer: Self-pay

## 2019-04-16 ENCOUNTER — Encounter (HOSPITAL_COMMUNITY): Payer: Self-pay

## 2019-04-16 DIAGNOSIS — F1099 Alcohol use, unspecified with unspecified alcohol-induced disorder: Secondary | ICD-10-CM | POA: Insufficient documentation

## 2019-04-16 DIAGNOSIS — Z21 Asymptomatic human immunodeficiency virus [HIV] infection status: Secondary | ICD-10-CM | POA: Insufficient documentation

## 2019-04-16 DIAGNOSIS — F1020 Alcohol dependence, uncomplicated: Secondary | ICD-10-CM | POA: Diagnosis present

## 2019-04-16 DIAGNOSIS — F329 Major depressive disorder, single episode, unspecified: Secondary | ICD-10-CM | POA: Insufficient documentation

## 2019-04-16 DIAGNOSIS — Y908 Blood alcohol level of 240 mg/100 ml or more: Secondary | ICD-10-CM | POA: Diagnosis present

## 2019-04-16 DIAGNOSIS — Z590 Homelessness: Secondary | ICD-10-CM

## 2019-04-16 DIAGNOSIS — F332 Major depressive disorder, recurrent severe without psychotic features: Secondary | ICD-10-CM | POA: Diagnosis present

## 2019-04-16 DIAGNOSIS — F17210 Nicotine dependence, cigarettes, uncomplicated: Secondary | ICD-10-CM | POA: Insufficient documentation

## 2019-04-16 DIAGNOSIS — Z20828 Contact with and (suspected) exposure to other viral communicable diseases: Secondary | ICD-10-CM | POA: Insufficient documentation

## 2019-04-16 DIAGNOSIS — Z1159 Encounter for screening for other viral diseases: Secondary | ICD-10-CM

## 2019-04-16 HISTORY — DX: Alcohol abuse, uncomplicated: F10.10

## 2019-04-16 LAB — CBC
HCT: 45.5 % (ref 39.0–52.0)
Hemoglobin: 15.7 g/dL (ref 13.0–17.0)
MCH: 32.2 pg (ref 26.0–34.0)
MCHC: 34.5 g/dL (ref 30.0–36.0)
MCV: 93.4 fL (ref 80.0–100.0)
Platelets: 201 10*3/uL (ref 150–400)
RBC: 4.87 MIL/uL (ref 4.22–5.81)
RDW: 14.6 % (ref 11.5–15.5)
WBC: 3.6 10*3/uL — ABNORMAL LOW (ref 4.0–10.5)
nRBC: 0 % (ref 0.0–0.2)

## 2019-04-16 LAB — SALICYLATE LEVEL: Salicylate Lvl: <7 mg/dL (ref 2.8–30.0)

## 2019-04-16 LAB — COMPREHENSIVE METABOLIC PANEL
ALT: 96 U/L — ABNORMAL HIGH (ref 0–44)
AST: 66 U/L — ABNORMAL HIGH (ref 15–41)
Albumin: 4.3 g/dL (ref 3.5–5.0)
Alkaline Phosphatase: 60 U/L (ref 38–126)
Anion gap: 8 (ref 5–15)
BUN: 7 mg/dL (ref 6–20)
CO2: 26 mmol/L (ref 22–32)
Calcium: 8.9 mg/dL (ref 8.9–10.3)
Chloride: 103 mmol/L (ref 98–111)
Creatinine, Ser: 0.59 mg/dL — ABNORMAL LOW (ref 0.61–1.24)
GFR calc Af Amer: >60 mL/min (ref >60)
GFR calc non Af Amer: >60 mL/min (ref >60)
Glucose, Bld: 107 mg/dL — ABNORMAL HIGH (ref 70–99)
Potassium: 3.8 mmol/L (ref 3.5–5.1)
Sodium: 137 mmol/L (ref 135–145)
Total Bilirubin: 0.7 mg/dL (ref 0.3–1.2)
Total Protein: 8.3 g/dL — ABNORMAL HIGH (ref 6.5–8.1)

## 2019-04-16 LAB — ACETAMINOPHEN LEVEL: Acetaminophen (Tylenol), Serum: <10 ug/mL — ABNORMAL LOW (ref 10–30)

## 2019-04-16 LAB — LIPASE, BLOOD: Lipase: 49 U/L (ref 11–51)

## 2019-04-16 LAB — SARS CORONAVIRUS 2 BY RT PCR (HOSPITAL ORDER, PERFORMED IN ~~LOC~~ HOSPITAL LAB): SARS Coronavirus 2: NEGATIVE

## 2019-04-16 LAB — ETHANOL: Alcohol, Ethyl (B): 249 mg/dL — ABNORMAL HIGH (ref <10)

## 2019-04-16 LAB — FOLATE: Folate: 4.9 ng/mL — ABNORMAL LOW (ref >5.9)

## 2019-04-16 MED ORDER — VITAMIN B-1 100 MG PO TABS
100.0000 mg | ORAL_TABLET | Freq: Every day | ORAL | Status: DC
  Administered 2019-04-17: 100 mg via ORAL
  Filled 2019-04-16 (×4): qty 1

## 2019-04-16 MED ORDER — SODIUM CHLORIDE 0.9 % IV BOLUS
1000.0000 mL | Freq: Once | INTRAVENOUS | Status: AC
  Administered 2019-04-16: 1000 mL via INTRAVENOUS

## 2019-04-16 MED ORDER — LORAZEPAM 1 MG PO TABS: 1.0000 mg | ORAL_TABLET | Freq: Four times a day (QID) | ORAL | Status: DC | PRN

## 2019-04-16 MED ORDER — LORAZEPAM 1 MG PO TABS: 1.0000 mg | ORAL_TABLET | Freq: Two times a day (BID) | ORAL | Status: DC

## 2019-04-16 MED ORDER — LORAZEPAM 2 MG PO TABS: 0.0000 mg | ORAL_TABLET | Freq: Four times a day (QID) | ORAL | Status: DC

## 2019-04-16 MED ORDER — THIAMINE HCL 100 MG/ML IJ SOLN
100.0000 mg | Freq: Once | INTRAMUSCULAR | Status: AC
  Administered 2019-04-16: 100 mg via INTRAMUSCULAR
  Filled 2019-04-16: qty 2

## 2019-04-16 MED ORDER — ONDANSETRON HCL 4 MG/2ML IJ SOLN
4.0000 mg | Freq: Once | INTRAMUSCULAR | Status: AC | PRN
  Administered 2019-04-16: 4 mg via INTRAVENOUS

## 2019-04-16 MED ORDER — HYDROXYZINE HCL 25 MG PO TABS
25.0000 mg | ORAL_TABLET | Freq: Four times a day (QID) | ORAL | Status: DC | PRN
  Administered 2019-04-16 – 2019-04-17 (×2): 25 mg via ORAL
  Filled 2019-04-16 (×2): qty 1

## 2019-04-16 MED ORDER — ACETAMINOPHEN 325 MG PO TABS: 650.0000 mg | ORAL_TABLET | Freq: Four times a day (QID) | ORAL | Status: DC | PRN

## 2019-04-16 MED ORDER — LORAZEPAM 1 MG PO TABS
1.0000 mg | ORAL_TABLET | Freq: Four times a day (QID) | ORAL | Status: DC
  Administered 2019-04-16 – 2019-04-17 (×3): 1 mg via ORAL
  Filled 2019-04-16 (×4): qty 1

## 2019-04-16 MED ORDER — MAGNESIUM HYDROXIDE 400 MG/5ML PO SUSP: 30.0000 mL | Freq: Every day | ORAL | Status: DC | PRN

## 2019-04-16 MED ORDER — ENSURE ENLIVE PO LIQD: 237.0000 mL | Freq: Two times a day (BID) | ORAL | Status: DC

## 2019-04-16 MED ORDER — LORAZEPAM 2 MG/ML IJ SOLN: 1.0000 mg | Freq: Four times a day (QID) | INTRAMUSCULAR | Status: DC | PRN

## 2019-04-16 MED ORDER — SODIUM CHLORIDE 0.9% FLUSH: 3.0000 mL | Freq: Once | INTRAVENOUS | Status: DC

## 2019-04-16 MED ORDER — LORAZEPAM 2 MG/ML IJ SOLN
0.0000 mg | Freq: Four times a day (QID) | INTRAMUSCULAR | Status: DC
  Administered 2019-04-16: 1 mg via INTRAVENOUS
  Filled 2019-04-16: qty 1

## 2019-04-16 MED ORDER — LORAZEPAM 2 MG PO TABS: 0.0000 mg | ORAL_TABLET | Freq: Two times a day (BID) | ORAL | Status: DC

## 2019-04-16 MED ORDER — THIAMINE HCL 100 MG/ML IJ SOLN
100.0000 mg | Freq: Once | INTRAMUSCULAR | Status: AC
  Administered 2019-04-16: 100 mg via INTRAVENOUS
  Filled 2019-04-16: qty 2

## 2019-04-16 MED ORDER — LORAZEPAM 1 MG PO TABS: 1.0000 mg | ORAL_TABLET | Freq: Every day | ORAL | Status: DC

## 2019-04-16 MED ORDER — FOLIC ACID 1 MG PO TABS: 1.0000 mg | ORAL_TABLET | Freq: Every day | ORAL | Status: DC

## 2019-04-16 MED ORDER — VITAMIN B-1 100 MG PO TABS: 100.0000 mg | ORAL_TABLET | Freq: Every day | ORAL | Status: DC

## 2019-04-16 MED ORDER — ADULT MULTIVITAMIN W/MINERALS CH: 1.0000 | ORAL_TABLET | Freq: Every day | ORAL | Status: DC

## 2019-04-16 MED ORDER — LORAZEPAM 2 MG/ML IJ SOLN: 0.0000 mg | Freq: Two times a day (BID) | INTRAMUSCULAR | Status: DC

## 2019-04-16 MED ORDER — THIAMINE HCL 100 MG/ML IJ SOLN: 100.0000 mg | Freq: Every day | INTRAMUSCULAR | Status: DC

## 2019-04-16 MED ORDER — ADULT MULTIVITAMIN W/MINERALS CH
1.0000 | ORAL_TABLET | Freq: Every day | ORAL | Status: DC
  Filled 2019-04-16: qty 1

## 2019-04-16 MED ORDER — LORAZEPAM 1 MG PO TABS: 0.0000 mg | ORAL_TABLET | Freq: Four times a day (QID) | ORAL | Status: DC

## 2019-04-16 MED ORDER — FOLIC ACID 1 MG PO TABS
1.0000 mg | ORAL_TABLET | Freq: Every day | ORAL | Status: DC
  Administered 2019-04-17: 1 mg via ORAL
  Filled 2019-04-16 (×4): qty 1

## 2019-04-16 MED ORDER — ADULT MULTIVITAMIN W/MINERALS CH
1.0000 | ORAL_TABLET | Freq: Every day | ORAL | Status: DC
  Administered 2019-04-16 – 2019-04-17 (×2): 1 via ORAL
  Filled 2019-04-16 (×5): qty 1

## 2019-04-16 MED ORDER — LORAZEPAM 1 MG PO TABS: 0.0000 mg | ORAL_TABLET | Freq: Two times a day (BID) | ORAL | Status: DC

## 2019-04-16 MED ORDER — ONDANSETRON HCL 4 MG/2ML IJ SOLN
INTRAMUSCULAR | Status: AC
  Filled 2019-04-16: qty 2

## 2019-04-16 MED ORDER — LORAZEPAM 1 MG PO TABS: 1.0000 mg | ORAL_TABLET | Freq: Three times a day (TID) | ORAL | Status: DC

## 2019-04-16 MED ORDER — ALUM & MAG HYDROXIDE-SIMETH 200-200-20 MG/5ML PO SUSP: 30.0000 mL | ORAL | Status: DC | PRN

## 2019-04-16 MED ORDER — VITAMIN B-1 100 MG PO TABS
100.0000 mg | ORAL_TABLET | Freq: Every day | ORAL | Status: DC
  Filled 2019-04-16: qty 1

## 2019-04-16 MED ORDER — LOPERAMIDE HCL 2 MG PO CAPS: 2.0000 mg | ORAL_CAPSULE | ORAL | Status: DC | PRN

## 2019-04-16 MED ORDER — ONDANSETRON 4 MG PO TBDP
4.0000 mg | ORAL_TABLET | Freq: Four times a day (QID) | ORAL | Status: DC | PRN
  Administered 2019-04-16: 4 mg via ORAL
  Filled 2019-04-16: qty 1

## 2019-04-16 MED ORDER — LORAZEPAM 1 MG PO TABS
1.0000 mg | ORAL_TABLET | Freq: Four times a day (QID) | ORAL | Status: DC | PRN
  Administered 2019-04-17: 1 mg via ORAL
  Filled 2019-04-16: qty 1

## 2019-04-16 NOTE — ED Notes (Signed)
Vomited small amount of green bilious emesis.

## 2019-04-16 NOTE — Progress Notes (Addendum)
Patient came to [facility] for admission.   Patient in wheelchair, could not stand up by himself.  Skin assessment completed with nurse standing on each side of him, holding him up.  Pimples on back.  Bruises on legs.   Patient stated he has been drinking many years.  That he likes to drink alcohol and knows that if he does not stop drinking that he will die.  That his mom and dad continue to drink.  Has not worked in 2 yrs.   NP talked to patient.  RN noticed that when he is not being watched, he stops shaking.  Patient was pushed in wheelchair to his room. Charge nurse informed of patient's behavior and 2nd shift to complete assessment.

## 2019-04-16 NOTE — ED Notes (Signed)
Report called to Precious Bard, RN at [facility], will call back to finish report once covid test results. [facility] will be called once test comes back.

## 2019-04-16 NOTE — Progress Notes (Signed)
Pt is a 33 year old male admitted with ETOH abuse and depression     Pt reports having suicidal thoughts with a plan to overdose on medications     Pt reports not taking medications for years   He is homeless and seeking to detox from ETOH and get help for depression    Pt was oriented to the unit and offered nourishment     Pt is cooperative but shakey and was given a wheelchair   Q 15 min checks initiated   Pt is safe [place] NOVEL CORONAVIRUS (COVID-19) DAILY CHECK-OFF SYMPTOMS - answer yes or no to each - every day NO YES  Have you had a fever in the past 24 hours?  . Fever (Temp > 37.80C / 100F) X   Have you had any of these symptoms in the past 24 hours? . New Cough .  Sore Throat  .  Shortness of Breath .  Difficulty Breathing .  Unexplained Body Aches   X   Have you had any one of these symptoms in the past 24 hours not related to allergies?   . Runny Nose .  Nasal Congestion .  Sneezing   X   If you have had runny nose, nasal congestion, sneezing in the past 24 hours, has it worsened?  X   EXPOSURES - check yes or no X   Have you traveled outside the state in the past 14 days?  X   Have you been in contact with someone with a confirmed diagnosis of COVID-19 or PUI in the past 14 days without wearing appropriate PPE?  X   Have you been living in the same home as a person with confirmed diagnosis of COVID-19 or a PUI (household contact)?    X   Have you been diagnosed with COVID-19?    X              What to do next: Answered NO to all: Answered YES to anything:   Proceed with unit schedule Follow the [facility] Inpatient Flowsheet.

## 2019-04-16 NOTE — ED Notes (Signed)
emtala reviewed by this RN 

## 2019-04-16 NOTE — ED Notes (Addendum)
Pt provided with lunch tray.

## 2019-04-16 NOTE — Progress Notes (Signed)
Psychoeducational Group Note  Date:  04/16/2019 Time: 2030  Group Topic/Focus:  wrap up group  Participation Level: Did Not Attend  Participation Quality:  Not Applicable  Affect:  Not Applicable  Cognitive:  Not Applicable  Insight:  Not Applicable  Engagement in Group: Not Applicable  Additional Comments:  Pt did not attend wrap up group. Pt reported high anxiety and detox symptoms.  Shellia Cleverly 04/16/2019, 9:33 PM

## 2019-04-16 NOTE — BH Assessment (Addendum)
Assessment Note  Gary [name] is an 33 y.o. male who presents to the ER seeking assistance for his alcohol use and depression. Patient states he has drank alcohol on a daily basis for the last two weeks. He was unable to give the amount he was drinking, "I really don't know. I just drink till I can't no more. Then start back drinking again." Patient further reports, until now, he didn't believe his drinking was "that big of a problem." However, now he reports "it's not fun no more..." He's past symptoms of withdrawals were; shakes, nausea and cold chills.  Patient also reports of having thoughts to end his life by overdosing on medications. He states he have no reason to live. He's relationship with his family is currently strained and distant due to his alcohol and drug use. His health has declined because he hasn't had his HIV medications for approximately a year. He's currently homeless. When asked what prevents him from ending his life, he started crying and stated "I don't have none." He denies past attempts and thoughts.  During the interview, the patient was calm, cooperative and pleasant. He was able to provided appropriate answers to the questions. He denies having a history of violence and aggression.   Diagnosis: Major Depression & Alcohol Use Disorder  Past Medical History:  Past Medical History:  Diagnosis Date  . Alcohol abuse   . HIV (human immunodeficiency virus infection) (HCC)     Past Surgical History:  Procedure Laterality Date  . INCISION AND DRAINAGE PERIRECTAL ABSCESS N/A 09/24/2016   Procedure: IRRIGATION AND DEBRIDEMENT PERIRECTAL ABSCESS;  Surgeon: Ricarda Frameharles Woodham, MD;  Location: [facility] ORS;  Service: General;  Laterality: N/A;  . none      Family History:  Family History  Problem Relation Age of Onset  . Alcohol abuse Mother   . Alcohol abuse Father     Social History:  reports that he has been smoking cigarettes. He has been smoking about 0.50 packs per  day. He has never used smokeless tobacco. He reports current alcohol use. He reports current drug use. Drugs: Marijuana and Methamphetamines.  Additional Social History:  Alcohol / Drug Use Pain Medications: See PTA Prescriptions: See PTA Over the Counter: See PTA History of alcohol / drug use?: Yes Longest period of sobriety (when/how long): "A year" Negative Consequences of Use: Personal relationships Withdrawal Symptoms: Fever / Chills, Tremors, Nausea / Vomiting, Sweats Substance #1 Name of Substance 1: Alcohol 1 - Last Use / Amount: 04/16/2019  CIWA: CIWA-Ar BP: 132/85 Pulse Rate: 81 Nausea and Vomiting: no nausea and no vomiting Tactile Disturbances: none Tremor: three Auditory Disturbances: not present Paroxysmal Sweats: no sweat visible Visual Disturbances: not present Anxiety: three Headache, Fullness in Head: none present Agitation: moderately fidgety and restless Orientation and Clouding of Sensorium: oriented and can do serial additions CIWA-Ar Total: 10 COWS:    Allergies:  Allergies  Allergen Reactions  . Caffeine Palpitations    Home Medications: (Not in a hospital admission)   OB/GYN Status:  No LMP for male patient.  General Assessment Data [facility] Assessment: In system Is this a Tele or Face-to-Face Assessment?: Face-to-Face Is this an Initial Assessment or a Re-assessment for this encounter?: Initial Assessment Patient Accompanied by:: N/A Language Other than English: No Living Arrangements: Homeless/Shelter What gender do you identify as?: Male Marital status: Single Pregnancy Status: No Living Arrangements: Other (Comment)(Homeless) Can pt return to current living arrangement?: Yes Admission Status: Voluntary Is  patient capable of signing voluntary admission?: Yes Referral Source: Self/Family/Friend Insurance type: None  Medical Screening Exam [facility] Walk-in ONLY) Medical Exam completed: Yes  Crisis Care  Plan Living Arrangements: Other (Comment)(Homeless) Legal Guardian: Other:(Self) Name of Psychiatrist: Reports of none Name of Therapist: Reports of none  Education Status Is patient currently in school?: No Is the patient employed, unemployed or receiving disability?: Unemployed  Risk to self with the past 6 months Suicidal Ideation: Yes-Currently Present Has patient been a risk to self within the past 6 months prior to admission? : Yes Suicidal Intent: No Has patient had any suicidal intent within the past 6 months prior to admission? : No Is patient at risk for suicide?: Yes Suicidal Plan?: Yes-Currently Present Has patient had any suicidal plan within the past 6 months prior to admission? : Yes Specify Current Suicidal Plan: Overdose on medications Access to Means: Yes Specify Access to Suicidal Means: OTC medications What has been your use of drugs/alcohol within the last 12 months?: Alcohol Previous Attempts/Gestures: No How many times?: 0 Other Self Harm Risks: Active addiction Triggers for Past Attempts: None known Intentional Self Injurious Behavior: None Family Suicide History: No Recent stressful life event(s): Other (Comment), Loss (Comment) Persecutory voices/beliefs?: No Depression: Yes Depression Symptoms: Feeling worthless/self pity, Isolating Substance abuse history and/or treatment for substance abuse?: Yes Suicide prevention information given to non-admitted patients: Not applicable  Risk to Others within the past 6 months Homicidal Ideation: No Does patient have any lifetime risk of violence toward others beyond the six months prior to admission? : No Thoughts of Harm to Others: No Current Homicidal Intent: No Current Homicidal Plan: No Access to Homicidal Means: No Identified Victim: Reports of none History of harm to others?: No Assessment of Violence: None Noted Violent Behavior Description: Reports of none Does patient have access to weapons?:  No Criminal Charges Pending?: No Does patient have a court date: No Is patient on probation?: No  Psychosis Hallucinations: None noted Delusions: None noted  Mental Status Report Appearance/Hygiene: Unremarkable, In scrubs Eye Contact: Fair Motor Activity: Freedom of movement, Unremarkable Speech: Logical/coherent, Unremarkable Level of Consciousness: Alert Mood: Anxious, Sad, Pleasant Affect: Appropriate to circumstance, Depressed, Sad Anxiety Level: Minimal Thought Processes: Coherent, Relevant Judgement: Partial Orientation: Person, Place, Time, Situation, Appropriate for developmental age Obsessive Compulsive Thoughts/Behaviors: None  Cognitive Functioning Concentration: Normal Memory: Recent Intact, Remote Intact Is patient IDD: No Insight: Fair Impulse Control: Fair Appetite: Fair Have you had any weight changes? : No Change Sleep: Decreased Total Hours of Sleep: 5 Vegetative Symptoms: None  ADLScreening [facility] Assessment Services) Patient's cognitive ability adequate to safely complete daily activities?: Yes Patient able to express need for assistance with ADLs?: Yes Independently performs ADLs?: Yes (appropriate for developmental age)  Prior Inpatient Therapy Prior Inpatient Therapy: Yes Prior Therapy Dates: 03/2019 Prior Therapy Facilty/Provider(s): [facility] Reason for Treatment: Detox and SA Treatment  Prior Outpatient Therapy Prior Outpatient Therapy: No  ADL Screening (condition at time of admission) Patient's cognitive ability adequate to safely complete daily activities?: Yes Is the patient deaf or have difficulty hearing?: No Does the patient have difficulty seeing, even when wearing glasses/contacts?: No Does the patient have difficulty concentrating, remembering, or making decisions?: No Patient able to express need for assistance with ADLs?: Yes Does the patient have difficulty dressing or bathing?: No Independently performs ADLs?: Yes (appropriate  for developmental age) Does the patient have difficulty walking or climbing stairs?: No Weakness of Legs: None Weakness of Arms/Hands: None  Home Assistive  Devices/Equipment Home Assistive Devices/Equipment: None  Therapy Consults (therapy consults require a physician order) PT Evaluation Needed: No OT Evalulation Needed: No SLP Evaluation Needed: No Abuse/Neglect Assessment (Assessment to be complete while patient is alone) Abuse/Neglect Assessment Can Be Completed: Yes Physical Abuse: Denies Verbal Abuse: Denies Exploitation of patient/patient's resources: Denies Self-Neglect: Denies Values / Beliefs Cultural Requests During Hospitalization: None Spiritual Requests During Hospitalization: None Consults Spiritual Care Consult Needed: No Social Work Consult Needed: No Regulatory affairs officer (For Healthcare) Does Patient Have a Medical Advance Directive?: No Would patient like information on creating a medical advance directive?: No - Patient declined          Disposition:  Disposition Initial Assessment Completed for this Encounter: Yes  On Site Evaluation by:   Reviewed with Physician:  Discussed patient with Nurse Practitioner Waylan Boga). He's recommended for inpatient treatment.   Gunnar Fusi MS, LCAS, [facility], [place], CCSI Therapeutic Triage Specialist 04/16/2019 2:01 PM

## 2019-04-16 NOTE — Tx Team (Signed)
Initial Treatment Plan 04/16/2019 8:22 PM Gary [name] FKC:[id number]    PATIENT STRESSORS: Health problems Medication change or noncompliance Substance abuse   PATIENT STRENGTHS: General fund of knowledge Motivation for treatment/growth   PATIENT IDENTIFIED PROBLEMS: "stop drinking"  "help with depression"                   DISCHARGE CRITERIA:  Improved stabilization in mood, thinking, and/or behavior Reduction of life-threatening or endangering symptoms to within safe limits Verbal commitment to aftercare and medication compliance  PRELIMINARY DISCHARGE PLAN: Attend aftercare/continuing care group Attend 12-step recovery group Placement in alternative living arrangements  PATIENT/FAMILY INVOLVEMENT: This treatment plan has been presented to and reviewed with the patient, Gary [name], and/or family member,.  The patient and family have been given the opportunity to ask questions and make suggestions.  Migdalia Dk, RN 04/16/2019, 8:22 PM

## 2019-04-16 NOTE — ED Triage Notes (Addendum)
Arrives via [facility].  EMS called to the [facility] by patient and a friend because "I can't eat or drink anything from drinking too much.  I am drinking to much."  Patient requesting detox.  Patient states "I'm thinking about killing myself.  I don't know what to do anymore".  C/O N/V x 3 days.  Last alcohol intake 2 days ago.  Patient states he drinks about 6 - 40oz a day.  Patietn has been to rehab in the past.  Last place [facility], last not that long ago.  Patient states they will not leg me come back.

## 2019-04-16 NOTE — ED Provider Notes (Addendum)
Val Riles  ,[facility] Emergency Department Provider Note  ____________________________________________   I have reviewed the triage vital signs and the nursing notes. Where available I have reviewed prior notes and, if possible and indicated, outside hospital notes.    HISTORY  Chief Complaint detox    HPI Gary [name] is a 33 y.o. male who presents today complaining of wanting to detox.  He does have a history of HIV polysubstance abuse, alcohol abuse, patient seen and evaluated during the coronavirus epidemic during a time with low staffing, he states that his last alcohol was this morning.  He told the nurse it was 2 days ago.  He states he is been vomiting having some loose stools but no fever.  He states that he is also has been using meth but not the last day or 2.   He was recently in rehab and left early he was recently here for similar complaints.  Patient states he is here because he does not feel well and he is interested in possibly in pursuing detox again.   Past Medical History:  Diagnosis Date  . Alcohol abuse   . HIV (human immunodeficiency virus infection) [facility])     Patient Active Problem List   Diagnosis Date Noted  . HIV disease ([place]) 06/16/2018  . Polysubstance abuse ([place]) 06/16/2018  . Cigarette smoker 06/16/2018  . Penile cellulitis 06/15/2018    Past Surgical History:  Procedure Laterality Date  . INCISION AND DRAINAGE PERIRECTAL ABSCESS N/A 09/24/2016   Procedure: IRRIGATION AND DEBRIDEMENT PERIRECTAL ABSCESS;  Surgeon: Clayburn Pert, MD;  Location: [facility] ORS;  Service: General;  Laterality: N/A;  . none      Prior to Admission medications   Not on File    Allergies Caffeine  Family History  Problem Relation Age of Onset  . Alcohol abuse Mother   . Alcohol abuse Father     Social History Social History   Tobacco Use  . Smoking status: Current Every Day Smoker    Packs/day: 0.50    Types: Cigarettes  .  Smokeless tobacco: Never Used  Substance Use Topics  . Alcohol use: Yes    Comment: 18pk/day  . Drug use: Yes    Types: Marijuana, Methamphetamines    Comment: last used 04/10/2019    Review of Systems Constitutional: No fever/chills Eyes: No visual changes. ENT: No sore throat. No stiff neck no neck pain Cardiovascular: Denies chest pain. Respiratory: Denies shortness of breath. Gastrointestinal: See HPI Genitourinary: Negative for dysuria. Musculoskeletal: Negative lower extremity swelling Skin: Negative for rash. Neurological: Negative for severe headaches, focal weakness or numbness.   ____________________________________________   PHYSICAL EXAM:  VITAL SIGNS: ED Triage Vitals  Enc Vitals Group     BP 04/16/19 1201 128/89     Pulse Rate 04/16/19 1201 78     Resp 04/16/19 1201 18     Temp 04/16/19 1201 98.4 F (36.9 C)     Temp Source 04/16/19 1201 Oral     SpO2 04/16/19 1201 95 %     Weight 04/16/19 1149 149 lb 14.6 oz (68 kg)     Height 04/16/19 1149 6\' 1"  (1.854 m)     Head Circumference --      Peak Flow --      Pain Score 04/16/19 1149 0     Pain Loc --      Pain Edu? --      Excl. in [place]? --     Constitutional:  Alert and oriented. Well appearing and in no acute distress.  Patient when I look at him through the window into his door is sitting still with no tremor however soon as I enter he begins to tremor and as I talk to him he seems to forget to tremor on the left and tremor more on the right.  The entire time, his heart rate is normal.  No evidence of seizure activity. Eyes: Conjunctivae are normal Head: Atraumatic HEENT: No congestion/rhinnorhea. Mucous membranes are moist.  Oropharynx non-erythematous Neck:   Nontender with no meningismus, no masses, no stridor Cardiovascular: Normal rate, regular rhythm. Grossly normal heart sounds.  Good peripheral circulation. Respiratory: Normal respiratory effort.  No retractions. Lungs CTAB. Abdominal: Soft and  discomfort no focal tenderness. No distention. No guarding no rebound Back:  There is no focal tenderness or step off.  there is no midline tenderness there are no lesions noted. there is no CVA tenderness Musculoskeletal: No lower extremity tenderness, no upper extremity tenderness. No joint effusions, no DVT signs strong distal pulses no edema Neurologic:  Normal speech and language. No gross focal neurologic deficits are appreciated.  Skin:  Skin is warm, dry and intact. No rash noted. Psychiatric: Mood and affect are anxious. Speech and behavior are normal.  ____________________________________________   LABS (all labs ordered are listed, but only abnormal results are displayed)  Labs Reviewed  CBC - Abnormal; Notable for the following components:      Result Value   WBC 3.6 (*)    All other components within normal limits  LIPASE, BLOOD  COMPREHENSIVE METABOLIC PANEL  URINALYSIS, COMPLETE (UACMP) WITH MICROSCOPIC  ETHANOL  ACETAMINOPHEN LEVEL  SALICYLATE LEVEL  URINE DRUG SCREEN, QUALITATIVE ([facility] ONLY)  FOLATE    Pertinent labs  results that were available during my care of the patient were reviewed by me and considered in my medical decision making (see chart for details). ____________________________________________  EKG  I personally interpreted any EKGs ordered by me or triage Sinus rate 73 no acute ST elevation or depression ossific ST changes ____________________________________________  RADIOLOGY  Pertinent labs & imaging results that were available during my care of the patient were reviewed by me and considered in my medical decision making (see chart for details). If possible, patient and/or family made aware of any abnormal findings.  No results found. ____________________________________________    PROCEDURES  Procedure(s) performed: None  Procedures  Critical Care performed: None  ____________________________________________   INITIAL  IMPRESSION / ASSESSMENT AND PLAN / ED COURSE  Pertinent labs & imaging results that were available during my care of the patient were reviewed by me and considered in my medical decision making (see chart for details).  Here complaining of EtOH abuse, making him throw up also meth abuse.  He is again talking about possibly wanting rehab.  We were down this road it looks like a couple days ago and that going home and drinking some more.  Not endorse SI or HI, he does not appear to be in acute withdrawal, he does have a normal heart rate normal blood pressure, every once in a while when he remembers he seems to shake but as soon as I leave the room he stops doing it.  It seems to be intentional.  He appears not inconsistent with some people who do try to get lytics that Ativan by is faking withdrawal symptoms.  We will check urine and blood and we will have TTS evaluate him.  Him on CIWA  protocol.  ----------------------------------------- 1:46 PM on 04/16/2019 -----------------------------------------  Call is 250 making withdrawal quite unlikely, I have however maintained him on CIWA precautions heart rate remains reassuring, and again when he is not being directly watched his tremor seems to disappear.  He did talk to our TTS, and stated that he had some vague thoughts of ending it all if we do not help him stop himself from drinking his alcohol.  We will therefore have psychiatry see him.  He does not have an active plan.    ____________________________________________   FINAL CLINICAL IMPRESSION(S) / ED DIAGNOSES  Final diagnoses:  None      This chart was dictated using voice recognition software.  Despite best efforts to proofread,  errors can occur which can change meaning.      Jeanmarie PlantMcShane, James A, MD 04/16/19 1242    Jeanmarie PlantMcShane, James A, MD 04/16/19 1346

## 2019-04-16 NOTE — ED Notes (Signed)
Pt undressed and placed in burgundy paper scrubs and gown. Belongings removed from room.

## 2019-04-16 NOTE — BH Assessment (Addendum)
Patient has been accepted to [facility]  Patient assigned to room 306-2 Accepting physician is Dr. Mallie Darting.  Call report to [phone].  Representative was [place].   Gary [name], Charge Nurse  [place], ER Secretary  Dr. Burlene Arnt, ER MD  Opal Sidles, Patient's Nurse    Call report prior to transportation arriving to get patient.  Bed available, pending COVID-19 results.  Address: [address]  [place], [place] [address]  Patient will transport via [place].

## 2019-04-17 DIAGNOSIS — F332 Major depressive disorder, recurrent severe without psychotic features: Principal | ICD-10-CM

## 2019-04-17 MED ORDER — FLUOXETINE HCL 20 MG PO CAPS
20.0000 mg | ORAL_CAPSULE | Freq: Every day | ORAL | Status: DC
  Filled 2019-04-17 (×3): qty 1

## 2019-04-17 NOTE — BHH Suicide Risk Assessment (Signed)
[facility] Admission Suicide Risk Assessment   Nursing information obtained from:  Patient Demographic factors:  Male, Gary [name], lesbian, or bisexual orientation, Low socioeconomic status, Caucasian Current Mental Status:  Suicidal ideation indicated by patient, Plan includes specific time, place, or method Loss Factors:  Decline in physical health Historical Factors:  Impulsivity Risk Reduction Factors:  NA  Total Time spent with patient: 45 minutes Principal Problem: Alcohol dependence methamphetamine abuse and severe depression/homelessness Diagnosis:  Active Problems:   Major depressive disorder, recurrent severe without psychotic features ([place])  Subjective Data:: To admission for detox and stabilization  Continued Clinical Symptoms:  Alcohol Use Disorder Identification Test Final Score (AUDIT): 36 The "Alcohol Use Disorders Identification Test", Guidelines for Use in Primary Care, Second Edition.  World Pharmacologist [facility]). Score between 0-7:  no or low risk or alcohol related problems. Score between 8-15:  moderate risk of alcohol related problems. Score between 16-19:  high risk of alcohol related problems. Score 20 or above:  warrants further diagnostic evaluation for alcohol dependence and treatment.   CLINICAL FACTORS:   Dysthymia   Musculoskeletal: Strength & Muscle Tone: within normal limits Gait & Station: normal Patient leans: N/A  Psychiatric Specialty Exam: Physical Exam blood pressure stable  ROS infectious positive for HIV negative for COVID screening/cardiovascular negative neurological negative for head injury or seizures  Blood pressure 122/83, pulse 92, temperature 98.7 F (37.1 C), temperature source Oral, resp. rate 16, SpO2 99 %.There is no height or weight on file to calculate BMI.  General Appearance: Casual  Eye Contact:  Minimal  Speech:  Clear and Coherent  Volume:  Decreased  Mood:  Dysphoric  Affect:  Blunt  Thought Process:  Coherent and  Linear  Orientation:  Full (Time, Place, and Person)  Thought Content:  Logical  Suicidal Thoughts:  Yes.  without intent/plan  Homicidal Thoughts:  No  Memory:  fair  Judgement:  Fair  Insight:  Fair  Psychomotor Activity:  Normal  Concentration:  Concentration: Fair  Recall:  [employer] of Knowledge:  Fair  Language:  Fair  Akathisia:  Negative  Handed:  Right  AIMS (if indicated):     Assets:  Physical Health Resilience  ADL's:  Intact  Cognition:  WNL  Sleep:  Number of Hours: 1.25      COGNITIVE FEATURES THAT CONTRIBUTE TO RISK:  Loss of executive function    SUICIDE RISK:   Mild:  Suicidal ideation of limited frequency, intensity, duration, and specificity.  There are no identifiable plans, no associated intent, mild dysphoria and related symptoms, good self-control (both objective and subjective assessment), few other risk factors, and identifiable protective factors, including available and accessible social support.  PLAN OF CARE: see eval- detox orders  I certify that inpatient services furnished can reasonably be expected to improve the patient's condition.   [name], MD 04/17/2019, 11:30 AM

## 2019-04-17 NOTE — Progress Notes (Signed)
Pt attended spiritual care group on grief and loss facilitated by chaplain Jerene Pitch  Group Goal:  Support / Education around grief and loss Members engage in facilitated group support and psycho social education.  Group Description:  Following introductions and group rules,  Group members engaged in facilitated group dialog and support around topic of loss, with particular support around experiences of loss in their lives. Group Identified types of loss (relationships / self / things) and identified patterns, circumstances, and changes that precipitate losses. Reflected on thoughts / feelings around loss, normalized grief responses, and recognized variety in grief experience. Patient Progress:  Gary [name] was present throughout group.  He was engaged in discussion.  Spoke with other group members about barriers to connecting with support.  Gary [name] recognized the theme of continuing to engage in unhelpful relationships - which bring isolation - because it is easier emotionally than risking forming new relationships and being disappointed by lack of support.  He states that he has been connecting with some folks at [facility] and spoke with facilitator about how this was different than his normal practice.

## 2019-04-17 NOTE — Plan of Care (Addendum)
Patient was flat and depressed upon approach. Denies SI HI AVH, verbally contracts for safety. Claims he did not sleep well- does not have sleep meds ordered. MD notified. Patient is compliant with medications. Denies side effects. Safety- 15 min checks.  Problem: Education: Goal: Knowledge of disease or condition will improve Outcome: Progressing Goal: Understanding of discharge needs will improve Outcome: Progressing   Problem: Health Behavior/Discharge Planning: Goal: Ability to identify changes in lifestyle to reduce recurrence of condition will improve Outcome: Progressing Goal: Identification of resources available to assist in meeting health care needs will improve Outcome: Progressing

## 2019-04-17 NOTE — Discharge Summary (Signed)
Physician Discharge Summary Note  Patient:  Gary [name] is an 33 y.o., male MRN:  [id number] DOB:  08/04/1986 Patient phone:  [phone] (home)  [place]   [place] [id number],  Total Time spent with patient: 45 minutes  Date of Admission:  04/16/2019 Date of Discharge: 04/17/2019  Reason for Admission:    History of Present Illness:  This is the first admission here but the second attempt at detox and rehab for this 33 year old homeless individual, who is also HIV positive, who phoned EMS on his own initiative seeking detox measures.  Blood alcohol level was 249 on presentation and drug screen pending  Patient does acknowledge recent thoughts of not wanting to live can contract for safety here further he acknowledges 1 prior attempted rehab but he did not complete the program. Has a history of recent methamphetamine abuse but his drug screen is pending at this point in time  History consistent with out of the assessment team found on 6/7 as follows  Gary Moneyony P Lipscombis an 33 y.o.malewho presents to the ER seeking assistance for his alcohol use and depression. Patient states he has drank alcohol on a daily basis for the last two weeks. He was unable to give the amount he was drinking, "I really don't know. I just drink till I can't no more. Then start back drinking again." Patient further reports, until now, he didn't believe his drinking was "that big of a problem." However, now he reports "it's not fun no more..." He's past symptoms of withdrawals were; shakes, nausea andcold chills.  Patient also reports of having thoughts to end his life by overdosing on medications. He states he have no reason to live. He's relationship with his family is currently strained and distant due to his alcohol and drug use. His health has declined because he hasn'thad his HIV medications for approximately a year. He's currently homeless. When asked what prevents him from ending his  life, he started crying and stated "I don't have none."He denies past attemptsand thoughts.  During the interview, the patient was calm, cooperative and pleasant. He was able to provided appropriate answers to the questions. He denies having a history of violence and aggression.  Principal Problem: <principal problem not specified> Discharge Diagnoses: Active Problems:   Major depressive disorder, recurrent severe without psychotic features [facility](HCC)   Past Medical History:  Past Medical History:  Diagnosis Date  . Alcohol abuse   . HIV (human immunodeficiency virus infection) (HCC)     Past Surgical History:  Procedure Laterality Date  . INCISION AND DRAINAGE PERIRECTAL ABSCESS N/A 09/24/2016   Procedure: IRRIGATION AND DEBRIDEMENT PERIRECTAL ABSCESS;  Surgeon: Ricarda Frameharles [place], MD;  Location: [facility] ORS;  Service: General;  Laterality: N/A;  . none     Family History:  Family History  Problem Relation Age of Onset  . Alcohol abuse Mother   . Alcohol abuse Father     Social History:  Social History   Substance and Sexual Activity  Alcohol Use Yes   Comment: 18pk/day     Social History   Substance and Sexual Activity  Drug Use Yes  . Types: Marijuana, Methamphetamines   Comment: last used 04/10/2019    Social History   Socioeconomic History  . Marital status: Single    Spouse name: Not on file  . Number of children: Not on file  . Years of education: Not on file  . Highest education level: Not on file  Occupational History  . Not  on file  Social Needs  . Financial resource strain: Not on file  . Food insecurity:    Worry: Not on file    Inability: Not on file  . Transportation needs:    Medical: Not on file    Non-medical: Not on file  Tobacco Use  . Smoking status: Current Every Day Smoker    Packs/day: 0.50    Types: Cigarettes  . Smokeless tobacco: Never Used  Substance and Sexual Activity  . Alcohol use: Yes    Comment: 18pk/day  . Drug use: Yes     Types: Marijuana, Methamphetamines    Comment: last used 04/10/2019  . Sexual activity: Never  Lifestyle  . Physical activity:    Days per week: Not on file    Minutes per session: Not on file  . Stress: Not on file  Relationships  . Social connections:    Talks on phone: Not on file    Gets together: Not on file    Attends religious service: Not on file    Active member of club or organization: Not on file    Attends meetings of clubs or organizations: Not on file    Relationship status: Not on file  Other Topics Concern  . Not on file  Social History Narrative   Staying with a friend   Independent at baseline    Hospital Course:    Patient was admitted for detox and stabilization at his request however throughout the day of 6/8 he approached various staff members demanding discharge sometimes he was rude sometimes he was less rude at any rate on second interview he told me he felt fine did not want any detox or rehab measures did not want to harm himself could contract fully stated "I was never suicidal" of course this contradicts his previous statements however he displayed no danger behaviors here and he states he is not even interested in getting back on his HIV medications.  At any rate he continues to harass staff about going home he is obviously craving alcohol or drugs so he is discharged because he is alert and oriented without psychotic symptoms without thoughts of harming self and contracting fully.  Physical Findings: AIMS: Facial and Oral Movements Muscles of Facial Expression: None, normal Lips and Perioral Area: None, normal Jaw: None, normal Tongue: None, normal,Extremity Movements Upper (arms, wrists, hands, fingers): Moderate Lower (legs, knees, ankles, toes): Moderate, Trunk Movements Neck, shoulders, hips: Mild, Overall Severity Severity of abnormal movements (highest score from questions above): Moderate Incapacitation due to abnormal movements:  Moderate Patient's awareness of abnormal movements (rate only patient's report): Aware, moderate distress, Dental Status Current problems with teeth and/or dentures?: No Does patient usually wear dentures?: No  CIWA:  CIWA-Ar Total: 0 COWS:  COWS Total Score: 14  Musculoskeletal:     Have you used any form of tobacco in the last 30 days? (Cigarettes, Smokeless Tobacco, Cigars, and/or Pipes): Yes  Has this patient used any form of tobacco in the last 30 days? (Cigarettes, Smokeless Tobacco, Cigars, and/or Pipes) Yes, No Musculoskeletal: Strength & Muscle Tone: within normal limits Gait & Station: normal Patient leans: N/A  Psychiatric Specialty Exam: Physical Exam blood pressure stable  ROS infectious positive for HIV negative for COVID screening/cardiovascular negative neurological negative for head injury or seizures  Blood pressure 122/83, pulse 92, temperature 98.7 F (37.1 C), temperature source Oral, resp. rate 16, SpO2 99 %.There is no height or weight on file to calculate BMI.  General  Appearance: Casual  Eye Contact:  Minimal  Speech:  Clear and Coherent  Volume:  Decreased  Mood:  irritable  Affect:  Blunt  Thought Process:  Coherent and Linear  Orientation:  Full (Time, Place, and Person)  Thought Content:  Logical  Suicidal Thoughts:  denies  Homicidal Thoughts:  No  Memory:  fair  Judgement:  Fair  Insight:  Fair  Psychomotor Activity:  Normal  Concentration:  Concentration: Fair  Recall:  [place] of Knowledge:  Fair  Language:  Fair  Akathisia:  Negative  Handed:  Right  AIMS (if indicated):     Assets:  Physical Health Resilience  ADL's:  Intact  Cognition:  WNL  Sleep:  Number of Hours: 1.25    Blood Alcohol level:  Lab Results  Component Value Date   ETH 249 (H) 04/16/2019   ETH 54 (H) 61/95/0932    Metabolic Disorder Labs:  Lab Results  Component Value Date   HGBA1C 5.3 09/24/2016   MPG 105 09/24/2016   No results found for:  PROLACTIN No results found for: CHOL, TRIG, HDL, CHOLHDL, VLDL, LDLCALC  See Psychiatric Specialty Exam and Suicide Risk Assessment completed by Attending Physician prior to discharge.  Discharge destination:  Home  Is patient on multiple antipsychotic therapies at discharge:  No   Has Patient had three or more failed trials of antipsychotic monotherapy by history:  No  Recommended Plan for Multiple Antipsychotic Therapies: NA   Allergies as of 04/17/2019      Reactions   Caffeine Palpitations      Medication List    You have not been prescribed any medications.    Follow-up Information    patient refuses Follow up.   Contact information: patient refuses all follow up         Signed: [name], MD 04/17/2019, 1:44 PM

## 2019-04-17 NOTE — H&P (Signed)
Psychiatric Admission Assessment Adult  Patient Identification: Gary [name] MRN:  [id number] Date of Evaluation:  04/17/2019 Chief Complaint:  MDD Alcohol Use disorder Principal Diagnosis: Alcohol dependence Diagnosis:  Active Problems:   Major depressive disorder, recurrent severe without psychotic features ([place])  History of Present Illness:  This is the first admission here but the second attempt at detox and rehab for this 33 year old homeless individual, who is also HIV positive, who phoned EMS on his own initiative seeking detox measures.  Blood alcohol level was 249 on presentation and drug screen pending  Patient does acknowledge recent thoughts of not wanting to live can contract for safety here further he acknowledges 1 prior attempted rehab but he did not complete the program. Has a history of recent methamphetamine abuse but his drug screen is pending at this point in time  History consistent with out of the assessment team found on 6/7 as follows  Gary [name] is an 33 y.o. male who presents to the ER seeking assistance for his alcohol use and depression. Patient states he has drank alcohol on a daily basis for the last two weeks. He was unable to give the amount he was drinking, "I really don't know. I just drink till I can't no more. Then start back drinking again." Patient further reports, until now, he didn't believe his drinking was "that big of a problem." However, now he reports "it's not fun no more..." He's past symptoms of withdrawals were; shakes, nausea and cold chills.  Patient also reports of having thoughts to end his life by overdosing on medications. He states he have no reason to live. He's relationship with his family is currently strained and distant due to his alcohol and drug use. His health has declined because he hasn't had his HIV medications for approximately a year. He's currently homeless. When asked what prevents him from ending his life, he  started crying and stated "I don't have none." He denies past attempts and thoughts.  During the interview, the patient was calm, cooperative and pleasant. He was able to provided appropriate answers to the questions. He denies having a history of violence and aggression.  Associated Signs/Symptoms: Depression Symptoms:  depressed mood, psychomotor retardation, (Hypo) Manic Symptoms:  Distractibility, Anxiety Symptoms:  Excessive Worry, Psychotic Symptoms:  n/a PTSD Symptoms: NA Total Time spent with patient: 45 minutes  Past Psychiatric History: was recently at [facility] but cut the program short  Is the patient at risk to self? Yes.    Has the patient been a risk to self in the past 6 months? No.  Has the patient been a risk to self within the distant past? No.  Is the patient a risk to others? No.  Has the patient been a risk to others in the past 6 months? No.  Has the patient been a risk to others within the distant past? No.  Alcohol Screening: 1. How often do you have a drink containing alcohol?: 4 or more times a week 2. How many drinks containing alcohol do you have on a typical day when you are drinking?: 10 or more 3. How often do you have six or more drinks on one occasion?: Daily or almost daily AUDIT-C Score: 12 4. How often during the last year have you found that you were not able to stop drinking once you had started?: Daily or almost daily 5. How often during the last year have you failed to do what was normally expected from you becasue  of drinking?: Daily or almost daily 6. How often during the last year have you needed a first drink in the morning to get yourself going after a heavy drinking session?: Daily or almost daily 7. How often during the last year have you had a feeling of guilt of remorse after drinking?: Weekly 8. How often during the last year have you been unable to remember what happened the night before because you had been drinking?: Weekly 9. Have you  or someone else been injured as a result of your drinking?: Yes, but not in the last year 10. Has a relative or friend or a doctor or another health worker been concerned about your drinking or suggested you cut down?: Yes, during the last year Alcohol Use Disorder Identification Test Final Score (AUDIT): 36 Alcohol Brief Interventions/Follow-up: Alcohol Education Substance Abuse History in the last 12 months:  Yes.   Consequences of Substance Abuse: NA Previous Psychotropic Medications: Yes  Psychological Evaluations: No  Past Medical History:  Past Medical History:  Diagnosis Date  . Alcohol abuse   . HIV (human immunodeficiency virus infection) (HCC)     Past Surgical History:  Procedure Laterality Date  . INCISION AND DRAINAGE PERIRECTAL ABSCESS N/A 09/24/2016   Procedure: IRRIGATION AND DEBRIDEMENT PERIRECTAL ABSCESS;  Surgeon: Ricarda Frameharles Woodham, MD;  Location: [facility] ORS;  Service: General;  Laterality: N/A;  . none     Family History:  Family History  Problem Relation Age of Onset  . Alcohol abuse Mother   . Alcohol abuse Father    Family Psychiatric  History: neg Tobacco Screening: Have you used any form of tobacco in the last 30 days? (Cigarettes, Smokeless Tobacco, Cigars, and/or Pipes): Yes Tobacco use, Select all that apply: 4 or less cigarettes per day Are you interested in Tobacco Cessation Medications?: No, patient refused Counseled patient on smoking cessation including recognizing danger situations, developing coping skills and basic information about quitting provided: Refused/Declined practical counseling Social History:  Social History   Substance and Sexual Activity  Alcohol Use Yes   Comment: 18pk/day     Social History   Substance and Sexual Activity  Drug Use Yes  . Types: Marijuana, Methamphetamines   Comment: last used 04/10/2019    Additional Social History:      Pain Medications: See PTA Prescriptions: See PTA Over the Counter: See PTA History  of alcohol / drug use?: Yes Longest period of sobriety (when/how long): "A year" Negative Consequences of Use: Personal relationships Withdrawal Symptoms: Fever / Chills, Tremors, Nausea / Vomiting, Sweats Name of Substance 1: Alcohol                  Allergies:   Allergies  Allergen Reactions  . Caffeine Palpitations   Lab Results:  Results for orders placed or performed during the hospital encounter of 04/16/19 (from the past 48 hour(s))  Lipase, blood     Status: None   Collection Time: 04/16/19 11:59 AM  Result Value Ref Range   Lipase 49 11 - 51 U/L    Comment: Performed at [facility], [address] [place], [place] [address]  Comprehensive metabolic panel     Status: Abnormal   Collection Time: 04/16/19 11:59 AM  Result Value Ref Range   Sodium 137 135 - 145 mmol/L   Potassium 3.8 3.5 - 5.1 mmol/L   Chloride 103 98 - 111 mmol/L   CO2 26 22 - 32 mmol/L   Glucose, Bld 107 (H) 70 - 99 mg/dL  BUN 7 6 - 20 mg/dL   Creatinine, Ser 1.610.59 (L) 0.61 - 1.24 mg/dL   Calcium 8.9 8.9 - 09.610.3 mg/dL   Total Protein 8.3 (H) 6.5 - 8.1 g/dL   Albumin 4.3 3.5 - 5.0 g/dL   AST 66 (H) 15 - 41 U/L   ALT 96 (H) 0 - 44 U/L   Alkaline Phosphatase 60 38 - 126 U/L   Total Bilirubin 0.7 0.3 - 1.2 mg/dL   GFR calc non Af Amer >60 >60 mL/min   GFR calc Af Amer >60 >60 mL/min   Anion gap 8 5 - 15    Comment: Performed at [facility], [address] [place], [place] [address]  CBC     Status: Abnormal   Collection Time: 04/16/19 11:59 AM  Result Value Ref Range   WBC 3.6 (L) 4.0 - 10.5 K/uL   RBC 4.87 4.22 - 5.81 MIL/uL   Hemoglobin 15.7 13.0 - 17.0 g/dL   HCT 98.145.5 19.139.0 - 47.852.0 %   MCV 93.4 80.0 - 100.0 fL   MCH 32.2 26.0 - 34.0 pg   MCHC 34.5 30.0 - 36.0 g/dL   RDW 29.514.6 62.111.5 - 30.815.5 %   Platelets 201 150 - 400 K/uL   nRBC 0.0 0.0 - 0.2 %    Comment: Performed at [facility], [address] [place], [place] [address]  Ethanol     Status: Abnormal    Collection Time: 04/16/19 11:59 AM  Result Value Ref Range   Alcohol, Ethyl (B) 249 (H) <10 mg/dL    Comment: (NOTE) Lowest detectable limit for serum alcohol is 10 mg/dL. For medical purposes only. Performed at [facility], [address] [place], [place] [address]   Acetaminophen level     Status: Abnormal   Collection Time: 04/16/19 11:59 AM  Result Value Ref Range   Acetaminophen (Tylenol), Serum <10 (L) 10 - 30 ug/mL    Comment: (NOTE) Therapeutic concentrations vary significantly. A range of 10-30 ug/mL  may be an effective concentration for many patients. However, some  are best treated at concentrations outside of this range. Acetaminophen concentrations >150 ug/mL at 4 hours after ingestion  and >50 ug/mL at 12 hours after ingestion are often associated with  toxic reactions. Performed at [facility], [address] [place], [place] [address]   Salicylate level     Status: None   Collection Time: 04/16/19 11:59 AM  Result Value Ref Range   Salicylate Lvl <7.0 2.8 - 30.0 mg/dL    Comment: Performed at [facility], [address] [place], [place] [address]  Folate     Status: Abnormal   Collection Time: 04/16/19 11:59 AM  Result Value Ref Range   Folate 4.9 (L) >5.9 ng/mL    Comment: Performed at [facility], [address] [place], [place] [address]  SARS Coronavirus 2 (CEPHEID - Performed in [facility] hospital lab), Hosp Order     Status: None   Collection Time: 04/16/19  2:04 PM  Result Value Ref Range   SARS Coronavirus 2 NEGATIVE NEGATIVE    Comment: (NOTE) If result is NEGATIVE SARS-CoV-2 target nucleic acids are NOT DETECTED. The SARS-CoV-2 RNA is generally detectable in upper and lower  respiratory specimens during the acute phase of infection. The lowest  concentration of SARS-CoV-2 viral copies this assay can detect is 250  copies / mL. A negative result does not preclude SARS-CoV-2 infection  and should  not be  used as the sole basis for treatment or other  patient management decisions.  A negative result may occur with  improper specimen collection / handling, submission of specimen other  than nasopharyngeal swab, presence of viral mutation(s) within the  areas targeted by this assay, and inadequate number of viral copies  (<250 copies / mL). A negative result must be combined with clinical  observations, patient history, and epidemiological information. If result is POSITIVE SARS-CoV-2 target nucleic acids are DETECTED. The SARS-CoV-2 RNA is generally detectable in upper and lower  respiratory specimens dur ing the acute phase of infection.  Positive  results are indicative of active infection with SARS-CoV-2.  Clinical  correlation with patient history and other diagnostic information is  necessary to determine patient infection status.  Positive results do  not rule out bacterial infection or co-infection with other viruses. If result is PRESUMPTIVE POSTIVE SARS-CoV-2 nucleic acids MAY BE PRESENT.   A presumptive positive result was obtained on the submitted specimen  and confirmed on repeat testing.  While 2019 novel coronavirus  (SARS-CoV-2) nucleic acids may be present in the submitted sample  additional confirmatory testing may be necessary for epidemiological  and / or clinical management purposes  to differentiate between  SARS-CoV-2 and other Sarbecovirus currently known to infect humans.  If clinically indicated additional testing with an alternate test  methodology [phone]) is advised. The SARS-CoV-2 RNA is generally  detectable in upper and lower respiratory sp ecimens during the acute  phase of infection. The expected result is Negative. Fact Sheet for Patients:  BoilerBrush.com.cy Fact Sheet for Healthcare Providers: https://pope.com/ This test is not yet approved or cleared by the [place] FDA and has been  authorized for detection and/or diagnosis of SARS-CoV-2 by FDA under an Emergency Use Authorization (EUA).  This EUA will remain in effect (meaning this test can be used) for the duration of the COVID-19 declaration under Section 564(b)(1) of the Act, 21 U.S.C. section 360bbb-3(b)(1), unless the authorization is terminated or revoked sooner. Performed at [facility], [address] [place], [place] [address]     Blood Alcohol level:  Lab Results  Component Value Date   ETH 249 (H) 04/16/2019   ETH 54 (H) 04/03/2019    Metabolic Disorder Labs:  Lab Results  Component Value Date   HGBA1C 5.3 09/24/2016   MPG 105 09/24/2016   No results found for: PROLACTIN No results found for: CHOL, TRIG, HDL, CHOLHDL, VLDL, LDLCALC  Current Medications: Current Facility-Administered Medications  Medication Dose Route Frequency Provider Last Rate Last Dose  . acetaminophen (TYLENOL) tablet 650 mg  650 mg Oral Q6H PRN Charm Rings, NP      . alum & mag hydroxide-simeth (MAALOX/MYLANTA) 200-200-20 MG/5ML suspension 30 mL  30 mL Oral Q4H PRN Charm Rings, NP      . feeding supplement (ENSURE ENLIVE) (ENSURE ENLIVE) liquid 237 mL  237 mL Oral BID BM Antonieta Pert, MD      . folic acid (FOLVITE) tablet 1 mg  1 mg Oral Daily Charm Rings, NP   1 mg at 04/17/19 0759  . hydrOXYzine (ATARAX/VISTARIL) tablet 25 mg  25 mg Oral Q6H PRN Money, Gerlene Burdock, FNP   25 mg at 04/17/19 0135  . loperamide (IMODIUM) capsule 2-4 mg  2-4 mg Oral PRN Money, Gerlene Burdock, FNP      . LORazepam (ATIVAN) tablet 1 mg  1 mg Oral Q6H PRN Money, Gerlene Burdock, FNP   1 mg  at 04/17/19 0135  . LORazepam (ATIVAN) tablet 1 mg  1 mg Oral QID Money, Feliz Beamravis B, FNP   1 mg at 04/17/19 0800   Followed by  . [START ON 04/18/2019] LORazepam (ATIVAN) tablet 1 mg  1 mg Oral TID Money, Gerlene Burdockravis B, FNP       Followed by  . [START ON 04/19/2019] LORazepam (ATIVAN) tablet 1 mg  1 mg Oral BID Money, Gerlene Burdockravis B, FNP       Followed by  .  [START ON 04/20/2019] LORazepam (ATIVAN) tablet 1 mg  1 mg Oral Daily Money, Travis B, FNP      . magnesium hydroxide (MILK OF MAGNESIA) suspension 30 mL  30 mL Oral Daily PRN Charm RingsLord, Jamison Y, NP      . multivitamin with minerals tablet 1 tablet  1 tablet Oral Daily Money, Gerlene Burdockravis B, FNP   1 tablet at 04/17/19 0759  . ondansetron (ZOFRAN-ODT) disintegrating tablet 4 mg  4 mg Oral Q6H PRN Money, Gerlene Burdockravis B, FNP   4 mg at 04/16/19 1900  . thiamine (VITAMIN B-1) tablet 100 mg  100 mg Oral Daily Money, Gerlene Burdockravis B, FNP   100 mg at 04/17/19 0800   PTA Medications: No medications prior to admission.    Musculoskeletal: Strength & Muscle Tone: within normal limits Gait & Station: normal Patient leans: N/A  Psychiatric Specialty Exam: Physical Exam blood pressure stable  ROS infectious positive for HIV negative for COVID screening/cardiovascular negative neurological negative for head injury or seizures  Blood pressure 122/83, pulse 92, temperature 98.7 F (37.1 C), temperature source Oral, resp. rate 16, SpO2 99 %.There is no height or weight on file to calculate BMI.  General Appearance: Casual  Eye Contact:  Minimal  Speech:  Clear and Coherent  Volume:  Decreased  Mood:  Dysphoric  Affect:  Blunt  Thought Process:  Coherent and Linear  Orientation:  Full (Time, Place, and Person)  Thought Content:  Logical  Suicidal Thoughts:  Yes.  without intent/plan  Homicidal Thoughts:  No  Memory:  fair  Judgement:  Fair  Insight:  Fair  Psychomotor Activity:  Normal  Concentration:  Concentration: Fair  Recall:  Fair  Fund of Knowledge:  Fair  Language:  Fair  Akathisia:  Negative  Handed:  Right  AIMS (if indicated):     Assets:  Physical Health Resilience  ADL's:  Intact  Cognition:  WNL  Sleep:  Number of Hours: 1.25    Treatment Plan Summary: Daily contact with patient to assess and evaluate symptoms and progress in treatment and Medication management  Observation Level/Precautions:   15 minute checks  Laboratory:  UDS  Psychotherapy: Cognitive and rehab based  Medications: Detox measures antidepressants and HIV meds  Consultations: None necessary  Discharge Concerns: Long-term sobriety and stability and housing  Estimated LOS: 3-5  Other: 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 status rule out some component of HIV encephalopathy   Physician Treatment Plan for Primary Diagnosis: <principal problem not specified> Long Term Goal(s): Improvement in symptoms so as ready for discharge  Short Term Goals: Ability to demonstrate self-control will improve, Ability to identify and develop effective coping behaviors will improve and Ability to maintain clinical measurements within normal limits will improve  Physician Treatment Plan for Secondary Diagnosis: Active Problems:   Major depressive disorder, recurrent severe without psychotic features (HCC)  Long Term Goal(s): Improvement in symptoms so as ready for discharge  Short Term Goals: Ability to  verbalize feelings will improve, Ability to disclose and discuss suicidal ideas and Ability to identify and develop effective coping behaviors will improve  I certify that inpatient services furnished can reasonably be expected to improve the patient's condition.    Malvin Johns, MD 6/8/202011:22 AM

## 2019-04-17 NOTE — Progress Notes (Signed)
Discharge note: Patient reviewed discharge paperwork with RN including prescriptions, follow up appointments, and lab work. Patient given the opportunity to ask questions. All concerns were addressed. All belongings were returned to patient. Denied SI/HI/AVH. Patient thanked staff for their care while at the hospital.  Patient was discharged to the lobby.

## 2019-04-17 NOTE — Progress Notes (Signed)
Patient approached Probation officer and asked if he can leave. Patient said he is not suicidal and the only reason why he was suicidal is because he was going through withdrawal. Writer told patient he was prescribed Prozac, patient stated "fuck no, I'm not taking that." MD notified and it was decided that patient is going to stay another day due to HIV medications and follow up.

## 2019-04-17 NOTE — [facility] Suicide Risk Assessment (Signed)
[facility] INPATIENT:  Family/Significant Other Suicide Prevention Education  Suicide Prevention Education:  Patient Refusal for Family/Significant Other Suicide Prevention Education: The patient Gary [name] has refused to provide written consent for family/significant other to be provided Family/Significant Other Suicide Prevention Education during admission and/or prior to discharge.  Physician notified.  Joellen Jersey 04/17/2019, 1:56 PM

## 2019-04-17 NOTE — Progress Notes (Signed)
Patient presents as irritable, demanding to discharge, refused to participate in assessment with CSW. Declines [facility] collateral contacts. Initially declines all outpatient follow up, later agrees to a referral to community health and wellness.   He intends to return back to [place], he will take the bus. He denies SI and HI  Stephanie Acre, [place] Social Worker

## 2019-04-17 NOTE — Tx Team (Signed)
Interdisciplinary Treatment and Diagnostic Plan Update  04/17/2019 Time of Session: 1:00pm Gary [name] MRN: [id number]  Principal Diagnosis: <principal problem not specified>  Secondary Diagnoses: Active Problems:   Major depressive disorder, recurrent severe without psychotic features ([place])   Current Medications:  Current Facility-Administered Medications  Medication Dose Route Frequency Provider Last Rate Last Dose  . acetaminophen (TYLENOL) tablet 650 mg  650 mg Oral Q6H PRN Patrecia Pour, NP      . alum & mag hydroxide-simeth (MAALOX/MYLANTA) 200-200-20 MG/5ML suspension 30 mL  30 mL Oral Q4H PRN Patrecia Pour, NP      . feeding supplement (ENSURE ENLIVE) (ENSURE ENLIVE) liquid 237 mL  237 mL Oral BID BM Sharma Covert, MD      . FLUoxetine (PROZAC) capsule 20 mg  20 mg Oral Daily [name], MD      . folic acid (FOLVITE) tablet 1 mg  1 mg Oral Daily Patrecia Pour, NP   1 mg at 04/17/19 0759  . hydrOXYzine (ATARAX/VISTARIL) tablet 25 mg  25 mg Oral Q6H PRN Money, Lowry Ram, FNP   25 mg at 04/17/19 0135  . loperamide (IMODIUM) capsule 2-4 mg  2-4 mg Oral PRN Money, Lowry Ram, FNP      . LORazepam (ATIVAN) tablet 1 mg  1 mg Oral Q6H PRN Money, Lowry Ram, FNP   1 mg at 04/17/19 0135  . LORazepam (ATIVAN) tablet 1 mg  1 mg Oral QID Money, Darnelle Maffucci B, FNP   1 mg at 04/17/19 0800   Followed by  . [START ON 04/18/2019] LORazepam (ATIVAN) tablet 1 mg  1 mg Oral TID Money, Lowry Ram, FNP       Followed by  . [START ON 04/19/2019] LORazepam (ATIVAN) tablet 1 mg  1 mg Oral BID Money, Lowry Ram, FNP       Followed by  . [START ON 04/20/2019] LORazepam (ATIVAN) tablet 1 mg  1 mg Oral Daily Money, Travis B, FNP      . magnesium hydroxide (MILK OF MAGNESIA) suspension 30 mL  30 mL Oral Daily PRN Patrecia Pour, NP      . multivitamin with minerals tablet 1 tablet  1 tablet Oral Daily Money, Lowry Ram, FNP   1 tablet at 04/17/19 0759  . ondansetron (ZOFRAN-ODT) disintegrating tablet 4 mg  4  mg Oral Q6H PRN Money, Lowry Ram, FNP   4 mg at 04/16/19 1900  . thiamine (VITAMIN B-1) tablet 100 mg  100 mg Oral Daily Money, Lowry Ram, FNP   100 mg at 04/17/19 0800   PTA Medications: No medications prior to admission.    Patient Stressors: Health problems Medication change or noncompliance Substance abuse  Patient Strengths: Technical sales engineer for treatment/growth  Treatment Modalities: Medication Management, Group therapy, Case management,  1 to 1 session with clinician, Psychoeducation, Recreational therapy.   Physician Treatment Plan for Primary Diagnosis: <principal problem not specified> Long Term Goal(s): Improvement in symptoms so as ready for discharge Improvement in symptoms so as ready for discharge   Short Term Goals: Ability to demonstrate self-control will improve Ability to identify and develop effective coping behaviors will improve Ability to maintain clinical measurements within normal limits will improve Ability to verbalize feelings will improve Ability to disclose and discuss suicidal ideas Ability to identify and develop effective coping behaviors will improve  Medication Management: Evaluate patient's response, side effects, and tolerance of medication regimen.  Therapeutic Interventions: 1 to 1 sessions, Unit  Group sessions and Medication administration.  Evaluation of Outcomes: Adequate for Discharge  Physician Treatment Plan for Secondary Diagnosis: Active Problems:   Major depressive disorder, recurrent severe without psychotic features ([facility])  Long Term Goal(s): Improvement in symptoms so as ready for discharge Improvement in symptoms so as ready for discharge   Short Term Goals: Ability to demonstrate self-control will improve Ability to identify and develop effective coping behaviors will improve Ability to maintain clinical measurements within normal limits will improve Ability to verbalize feelings will improve Ability to  disclose and discuss suicidal ideas Ability to identify and develop effective coping behaviors will improve     Medication Management: Evaluate patient's response, side effects, and tolerance of medication regimen.  Therapeutic Interventions: 1 to 1 sessions, Unit Group sessions and Medication administration.  Evaluation of Outcomes: Adequate for Discharge   RN Treatment Plan for Primary Diagnosis: <principal problem not specified> Long Term Goal(s): Knowledge of disease and therapeutic regimen to maintain health will improve  Short Term Goals: Ability to remain free from injury will improve, Ability to verbalize frustration and anger appropriately will improve, Ability to identify and develop effective coping behaviors will improve and Compliance with prescribed medications will improve  Medication Management: RN will administer medications as ordered by provider, will assess and evaluate patient's response and provide education to patient for prescribed medication. RN will report any adverse and/or side effects to prescribing provider.  Therapeutic Interventions: 1 on 1 counseling sessions, Psychoeducation, Medication administration, Evaluate responses to treatment, Monitor vital signs and CBGs as ordered, Perform/monitor CIWA, COWS, AIMS and Fall Risk screenings as ordered, Perform wound care treatments as ordered.  Evaluation of Outcomes: Adequate for Discharge   LCSW Treatment Plan for Primary Diagnosis: <principal problem not specified> Long Term Goal(s): Safe transition to appropriate next level of care at discharge, Engage patient in therapeutic group addressing interpersonal concerns.  Short Term Goals: Engage patient in aftercare planning with referrals and resources, Increase social support, Identify triggers associated with mental health/substance abuse issues and Increase skills for wellness and recovery  Therapeutic Interventions: Assess for all discharge needs, 1 to 1 time  with Social worker, Explore available resources and support systems, Assess for adequacy in community support network, Educate family and significant other(s) on suicide prevention, Complete Psychosocial Assessment, Interpersonal group therapy.  Evaluation of Outcomes: Adequate for Discharge   Progress in Treatment: Attending groups: No. Participating in groups: No. Taking medication as prescribed: No. Refuses all medication. Toleration medication: No. Family/Significant other contact made: No, will contact:  declines all consents, denies SI Patient understands diagnosis: Yes. Discussing patient identified problems/goals with staff: No. Medical problems stabilized or resolved: Yes. Denies suicidal/homicidal ideation: Yes. Issues/concerns per patient self-inventory: No.  New problem(s) identified: Yes, Describe:  financial stressors, not current with medical health care, homelessness  New Short Term/Long Term Goal(s): detox, medication management for mood stabilization; elimination of SI thoughts; development of comprehensive mental wellness/sobriety plan.  Patient Goals:  "Discharge"  Discharge Plan or Barriers: [facility], declines all follow up other than primary care.  Reason for Continuation of Hospitalization: Anxiety Depression  Estimated Length of Stay: discharge today  Attendees: Patient: Gary [name] 04/17/2019 1:49 PM  Physician:  04/17/2019 1:49 PM  Nursing:  04/17/2019 1:49 PM  RN Care Manager: 04/17/2019 1:49 PM  Social Worker: Enid Cutterharlotte Hoy, [place] 04/17/2019 1:49 PM  Recreational Therapist:  04/17/2019 1:49 PM  Other:  04/17/2019 1:49 PM  Other:  04/17/2019 1:49 PM  Other: 04/17/2019 1:49 PM  Scribe for Treatment Team: Darreld McleanCharlotte C Hoy, Theresia MajorsLCSWA 04/17/2019 1:49 PM

## 2019-04-17 NOTE — BHH Suicide Risk Assessment (Signed)
[facility] Discharge Suicide Risk Assessment   Principal Problem: Alcoholism Discharge Diagnoses: Active Problems:   Major depressive disorder, recurrent severe without psychotic features ([place])   Total Time spent with patient: 45 minutes Musculoskeletal: Strength & Muscle Tone: within normal limits Gait & Station: normal Patient leans: N/A  Psychiatric Specialty Exam: Physical Exam blood pressure stable  ROS infectious positive for HIV negative for COVID screening/cardiovascular negative neurological negative for head injury or seizures  Blood pressure 122/83, pulse 92, temperature 98.7 F (37.1 C), temperature source Oral, resp. rate 16, SpO2 99 %.There is no height or weight on file to calculate BMI.  General Appearance: Casual  Eye Contact:  Minimal  Speech:  Clear and Coherent  Volume:  Decreased  Mood:  Dysphoric  Affect:  Blunt  Thought Process:  Coherent and Linear  Orientation:  Full (Time, Place, and Person)  Thought Content:  Logical  Suicidal Thoughts:  Yes.  without intent/plan  Homicidal Thoughts:  No  Memory:  fair  Judgement:  Fair  Insight:  Fair  Psychomotor Activity:  Normal  Concentration:  Concentration: Fair  Recall:  [employer] of Knowledge:  Fair  Language:  Fair  Akathisia:  Negative  Handed:  Right  AIMS (if indicated):     Assets:  Physical Health Resilience  ADL's:  Intact  Cognition:  WNL  Sleep:  Number of Hours: 1.25     Mental Status Per Nursing Assessment::   On Admission:  Suicidal ideation indicated by patient, Plan includes specific time, place, or method  Demographic Factors:  Male, Caucasian, Low socioeconomic status and Unemployed  Loss Factors: Decrease in vocational status and Decline in physical health  Historical Factors: Impulsivity  Risk Reduction Factors:   Sense of responsibility to family and Religious beliefs about death  Continued Clinical Symptoms:  Alcohol/Substance Abuse/Dependencies  Cognitive Features  That Contribute To Risk:  Polarized thinking    Suicide Risk:  Minimal: No identifiable suicidal ideation.  Patients presenting with no risk factors but with morbid ruminations; may be classified as minimal risk based on the severity of the depressive symptoms  Follow-up Information    patient refuses Follow up.   Contact information: patient refuses all follow up       [place] Follow up.   Contact information: [place] [id number] [phone]          Plan Of Care/Follow-up recommendations:  Activity:  full  FARAH,BRIAN, MD 04/17/2019, 1:47 PM

## 2019-04-17 NOTE — Progress Notes (Signed)
NUTRITION ASSESSMENT RD working remotely.   Pt identified as at risk on the Malnutrition Screen Tool  INTERVENTION: - continue Ensure Enlive po BID, each supplement provides 350 kcal and 20 grams of protein - continue to encourage PO intakes.  - re-weight patient today to ensure weight recorded 6/7 was accurate.   NUTRITION DIAGNOSIS: Unintentional weight loss related to sub-optimal intake as evidenced by pt report.   Goal: Pt to meet >/= 90% of their estimated nutrition needs.  Monitor:  PO intake  Assessment:  Patient admitted for alcohol abuse and wanting detox, depression, and SI with a plan to OD on medications. Patient reported that he is homeless. Ensure Enlive was ordered BID at the time of admission per [facility] protocol. Per chart review, current weight is 150 lb; same weight as 5/11. It appears that weight has been stable x1 year.    33 y.o. male  Height: Ht Readings from Last 1 Encounters:  04/16/19 6\' 1"  (1.854 m)    Weight: Wt Readings from Last 1 Encounters:  04/16/19 68 kg    Weight Hx: Wt Readings from Last 10 Encounters:  04/16/19 68 kg  03/20/19 68 kg  06/15/18 66.6 kg  01/30/18 68 kg  07/19/17 72.6 kg  10/04/16 66.7 kg  10/01/16 66.7 kg  09/25/16 67 kg  09/18/16 65.8 kg  09/16/16 65.8 kg    BMI:  19.8 kg/m2 Pt meets criteria for normal weight based on current BMI.  Estimated Nutritional Needs: Kcal: 25-30 kcal/kg Protein: > 1 gram protein/kg Fluid: 1 ml/kcal  Diet Order:  Diet Order            Diet Heart Room service appropriate? Yes; Fluid consistency: Thin  Diet effective now             Pt is also offered choice of unit snacks mid-morning and mid-afternoon.  Pt is eating as desired.   Lab results and medications reviewed.     Jarome Matin, MS, RD, LDN, [facility] Inpatient Clinical Dietitian Pager # [phone] After hours/weekend pager # [phone]

## 2019-04-17 NOTE — Progress Notes (Signed)
  [facility] Adult Case Management Discharge Plan :  Will you be returning to the same living situation after discharge:  Yes,  heading back to [place] At discharge, do you have transportation home?: Yes,  bus and [facility] bus Do you have the ability to pay for your medications: No. Referred to [place].  Release of information consent forms completed and in the chart. SPE pamphlet on chart. Patient to Follow up at: Follow-up Information    patient refuses Follow up.   Contact information: patient refuses all follow up       [place] Follow up.   Why:  Your appointment will be held by phone on 06/18 at 8:50am, the provider will call you, please answer the phone. Contact information: [address] [place] [address] [phone]          Next level of care provider has access to [place] and Suicide Prevention discussed: Yes,  patient denies SI, refused to discuss and refused consents. SPE pamphlet placed on chart to share with supports.  Have you used any form of tobacco in the last 30 days? (Cigarettes, Smokeless Tobacco, Cigars, and/or Pipes): Yes  Has patient been referred to the Quitline?: Patient refused referral  Patient has been referred for addiction treatment: Pt. refused referral  Joellen Jersey, [place] 04/17/2019, 2:02 PM

## 2019-04-18 ENCOUNTER — Encounter (HOSPITAL_COMMUNITY): Payer: Self-pay | Admitting: Emergency Medicine

## 2019-04-18 ENCOUNTER — Emergency Department (HOSPITAL_COMMUNITY)
Admission: EM | Admit: 2019-04-18 | Discharge: 2019-04-18 | Disposition: A | Discharge status: Home / Self Care | Payer: Self-pay | Attending: Emergency Medicine | Admitting: Emergency Medicine

## 2019-04-18 ENCOUNTER — Other Ambulatory Visit: Payer: Self-pay

## 2019-04-18 DIAGNOSIS — F17210 Nicotine dependence, cigarettes, uncomplicated: Secondary | ICD-10-CM | POA: Insufficient documentation

## 2019-04-18 DIAGNOSIS — Z21 Asymptomatic human immunodeficiency virus [HIV] infection status: Secondary | ICD-10-CM | POA: Insufficient documentation

## 2019-04-18 DIAGNOSIS — F10120 Alcohol abuse with intoxication, uncomplicated: Secondary | ICD-10-CM | POA: Insufficient documentation

## 2019-04-18 DIAGNOSIS — F1010 Alcohol abuse, uncomplicated: Secondary | ICD-10-CM

## 2019-04-18 MED ORDER — ONDANSETRON 8 MG PO TBDP
8.0000 mg | ORAL_TABLET | Freq: Once | ORAL | Status: AC
  Administered 2019-04-18: 8 mg via ORAL
  Filled 2019-04-18: qty 1

## 2019-04-18 NOTE — ED Triage Notes (Signed)
Pt states he called EMS because he doesn't have the mental capacity to stay away from alcohol.

## 2019-04-18 NOTE — ED Notes (Signed)
Lunch tray was given to patient.  Pt is calm and cooperative and in no distress.  Will offer p.o. fluids.

## 2019-04-18 NOTE — Discharge Instructions (Addendum)
It was our pleasure to provide your ER care today - we hope that you feel better.  Avoid alcohol use - follow up with AA, and use resource guide provided for additional community resources.  Also follow up with closely with primary care doctor.  Return to ER if worse, new symptoms, fevers, trouble breathing, other concern.
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
## 2019-04-27 ENCOUNTER — Other Ambulatory Visit: Payer: Self-pay

## 2019-04-27 ENCOUNTER — Ambulatory Visit: Payer: Self-pay | Admitting: Primary Care

## 2019-05-02 ENCOUNTER — Encounter (HOSPITAL_COMMUNITY): Payer: Self-pay | Admitting: Emergency Medicine

## 2019-05-02 ENCOUNTER — Emergency Department (HOSPITAL_COMMUNITY)
Admission: EM | Admit: 2019-05-02 | Discharge: 2019-05-03 | Disposition: A | Discharge status: Home / Self Care | Payer: Self-pay | Attending: Emergency Medicine | Admitting: Emergency Medicine

## 2019-05-02 ENCOUNTER — Other Ambulatory Visit: Payer: Self-pay

## 2019-05-02 DIAGNOSIS — Z21 Asymptomatic human immunodeficiency virus [HIV] infection status: Secondary | ICD-10-CM | POA: Insufficient documentation

## 2019-05-02 DIAGNOSIS — F17210 Nicotine dependence, cigarettes, uncomplicated: Secondary | ICD-10-CM | POA: Insufficient documentation

## 2019-05-02 DIAGNOSIS — R51 Headache: Secondary | ICD-10-CM | POA: Insufficient documentation
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
## 2019-05-03 ENCOUNTER — Encounter (HOSPITAL_COMMUNITY): Payer: Self-pay | Admitting: Emergency Medicine

## 2019-05-03 ENCOUNTER — Emergency Department (HOSPITAL_COMMUNITY): Payer: Self-pay

## 2019-05-03 DIAGNOSIS — R51 Headache: Secondary | ICD-10-CM | POA: Insufficient documentation

## 2019-05-03 DIAGNOSIS — S098XXD Other specified injuries of head, subsequent encounter: Secondary | ICD-10-CM | POA: Insufficient documentation

## 2019-05-03 DIAGNOSIS — F17210 Nicotine dependence, cigarettes, uncomplicated: Secondary | ICD-10-CM | POA: Insufficient documentation

## 2019-05-03 DIAGNOSIS — S0001XD Abrasion of scalp, subsequent encounter: Secondary | ICD-10-CM | POA: Insufficient documentation
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
## 2019-05-03 MED ORDER — ACETAMINOPHEN 500 MG PO TABS
1000.0000 mg | ORAL_TABLET | Freq: Once | ORAL | Status: AC
  Administered 2019-05-03: 1000 mg via ORAL
  Filled 2019-05-03: qty 2

## 2019-05-03 NOTE — ED Notes (Signed)
Patient transported to CT 
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
## 2019-05-04 ENCOUNTER — Encounter (HOSPITAL_COMMUNITY): Payer: Self-pay

## 2019-05-04 ENCOUNTER — Emergency Department (HOSPITAL_COMMUNITY)
Admission: EM | Admit: 2019-05-04 | Discharge: 2019-05-04 | Disposition: A | Discharge status: Home / Self Care | Payer: Self-pay | Attending: Emergency Medicine | Admitting: Emergency Medicine

## 2019-05-04 ENCOUNTER — Emergency Department (HOSPITAL_COMMUNITY)
Admission: EM | Admit: 2019-05-04 | Discharge: 2019-05-05 | Disposition: A | Discharge status: Home / Self Care | Payer: Self-pay | Attending: Emergency Medicine | Admitting: Emergency Medicine

## 2019-05-04 ENCOUNTER — Other Ambulatory Visit: Payer: Self-pay

## 2019-05-04 DIAGNOSIS — F19929 Other psychoactive substance use, unspecified with intoxication, unspecified: Secondary | ICD-10-CM | POA: Insufficient documentation

## 2019-05-04 DIAGNOSIS — W19XXXA Unspecified fall, initial encounter: Secondary | ICD-10-CM | POA: Insufficient documentation

## 2019-05-04 DIAGNOSIS — F1010 Alcohol abuse, uncomplicated: Secondary | ICD-10-CM | POA: Insufficient documentation

## 2019-05-04 DIAGNOSIS — F17210 Nicotine dependence, cigarettes, uncomplicated: Secondary | ICD-10-CM | POA: Insufficient documentation

## 2019-05-04 DIAGNOSIS — Y908 Blood alcohol level of 240 mg/100 ml or more: Secondary | ICD-10-CM | POA: Insufficient documentation

## 2019-05-04 DIAGNOSIS — S0990XD Unspecified injury of head, subsequent encounter: Secondary | ICD-10-CM

## 2019-05-04 LAB — CBG MONITORING, ED: Glucose-Capillary: 85 mg/dL (ref 70–99)

## 2019-05-04 MED ORDER — IBUPROFEN 800 MG PO TABS
800.0000 mg | ORAL_TABLET | Freq: Once | ORAL | Status: AC
  Administered 2019-05-04: 800 mg via ORAL
  Filled 2019-05-04: qty 1
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
## 2019-05-04 NOTE — ED Notes (Signed)
On discharge pt was able to follow commands, ambulate with a steady gait and respond appropriately to questions/situations.

## 2019-05-04 NOTE — Discharge Instructions (Signed)
Can take tylenol or motrin for headache. Follow-up with your primary care doctor. Return here for any new/acute changes.
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
## 2019-05-05 ENCOUNTER — Emergency Department (HOSPITAL_COMMUNITY): Payer: Self-pay

## 2019-05-05 LAB — RAPID URINE DRUG SCREEN, HOSP PERFORMED
Amphetamines: NOT DETECTED
Barbiturates: NOT DETECTED
Benzodiazepines: POSITIVE — AB
Cocaine: NOT DETECTED
Opiates: NOT DETECTED
Tetrahydrocannabinol: NOT DETECTED

## 2019-05-05 LAB — CBC WITH DIFFERENTIAL/PLATELET
Abs Immature Granulocytes: 0.01 10*3/uL (ref 0.00–0.07)
Basophils Absolute: 0 10*3/uL (ref 0.0–0.1)
Basophils Relative: 1 %
Eosinophils Absolute: 0.1 10*3/uL (ref 0.0–0.5)
Eosinophils Relative: 5 %
HCT: 41.6 % (ref 39.0–52.0)
Hemoglobin: 14.1 g/dL (ref 13.0–17.0)
Immature Granulocytes: 0 %
Lymphocytes Relative: 48 %
Lymphs Abs: 1.5 10*3/uL (ref 0.7–4.0)
MCH: 33 pg (ref 26.0–34.0)
MCHC: 33.9 g/dL (ref 30.0–36.0)
MCV: 97.4 fL (ref 80.0–100.0)
Monocytes Absolute: 0.2 10*3/uL (ref 0.1–1.0)
Monocytes Relative: 7 %
Neutro Abs: 1.2 10*3/uL — ABNORMAL LOW (ref 1.7–7.7)
Neutrophils Relative %: 39 %
Platelets: 149 10*3/uL — ABNORMAL LOW (ref 150–400)
RBC: 4.27 MIL/uL (ref 4.22–5.81)
RDW: 14.6 % (ref 11.5–15.5)
WBC: 3 10*3/uL — ABNORMAL LOW (ref 4.0–10.5)
nRBC: 0 % (ref 0.0–0.2)

## 2019-05-05 LAB — COMPREHENSIVE METABOLIC PANEL
ALT: 77 U/L — ABNORMAL HIGH (ref 0–44)
AST: 73 U/L — ABNORMAL HIGH (ref 15–41)
Albumin: 3.8 g/dL (ref 3.5–5.0)
Alkaline Phosphatase: 47 U/L (ref 38–126)
Anion gap: 12 (ref 5–15)
BUN: <5 mg/dL — ABNORMAL LOW (ref 6–20)
CO2: 23 mmol/L (ref 22–32)
Calcium: 8.3 mg/dL — ABNORMAL LOW (ref 8.9–10.3)
Chloride: 106 mmol/L (ref 98–111)
Creatinine, Ser: 0.74 mg/dL (ref 0.61–1.24)
GFR calc Af Amer: >60 mL/min (ref >60)
GFR calc non Af Amer: >60 mL/min (ref >60)
Glucose, Bld: 93 mg/dL (ref 70–99)
Potassium: 3.8 mmol/L (ref 3.5–5.1)
Sodium: 141 mmol/L (ref 135–145)
Total Bilirubin: 0.6 mg/dL (ref 0.3–1.2)
Total Protein: 6.6 g/dL (ref 6.5–8.1)

## 2019-05-05 LAB — ETHANOL: Alcohol, Ethyl (B): 403 mg/dL (ref <10)

## 2019-05-05 IMAGING — CT CT HEAD WITHOUT CONTRAST
4 series · 15 of 47 positions shown, 17 images · non-contrast
Comparison: [DATE]

CLINICAL DATA: Seizure

EXAM:
CT HEAD WITHOUT CONTRAST
TECHNIQUE: Contiguous axial images were obtained from the base of the skull
through the vertex without intravenous contrast.

[Series 3: head wo · axial · 0.45mm/px · z∈[-89,+51]mm · 7 of 38 slices shown, 9 images]
[im 5/38  brain]
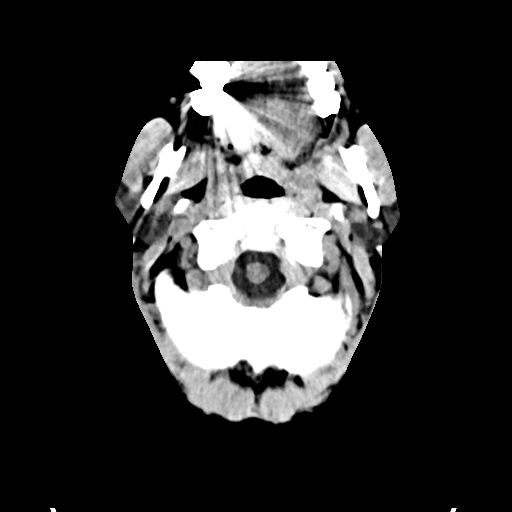
[im 5/38  bone]
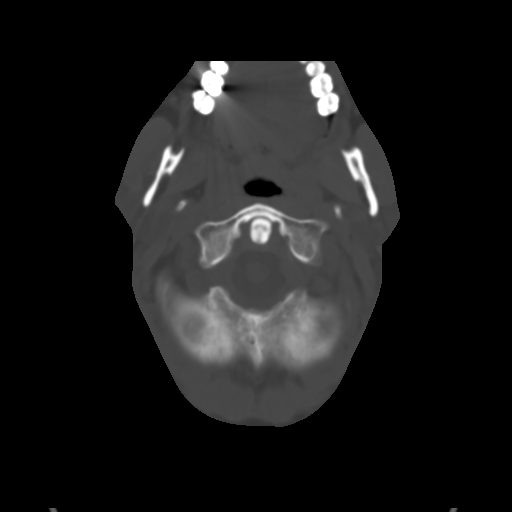
[im 10/38  brain]
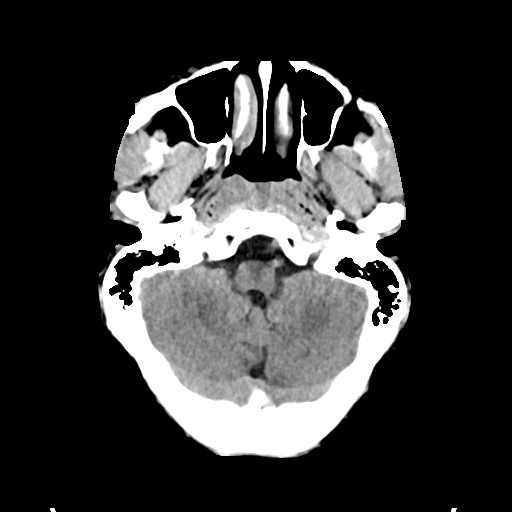
[im 14/38  brain]
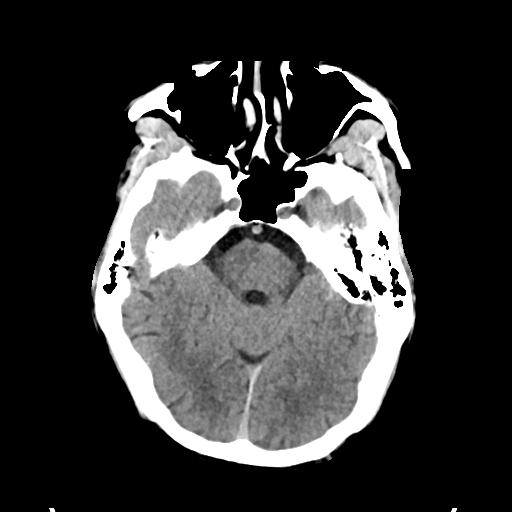
[im 19/38  brain]
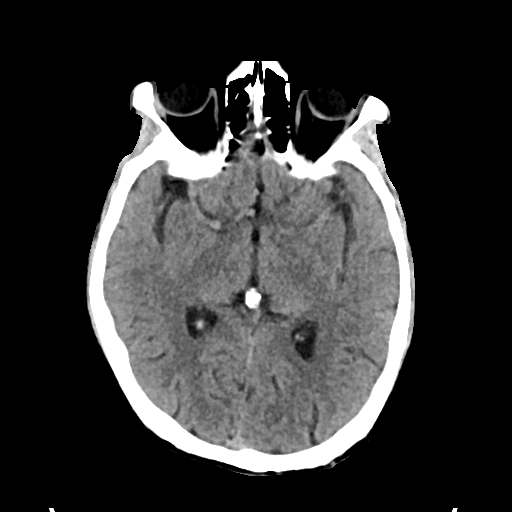
[im 24/38  brain]
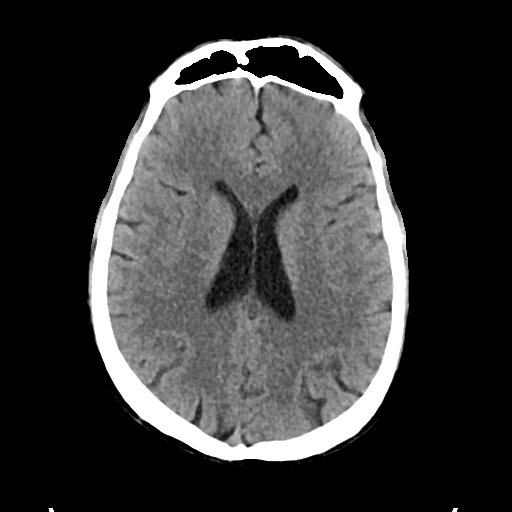
[im 24/38  bone]
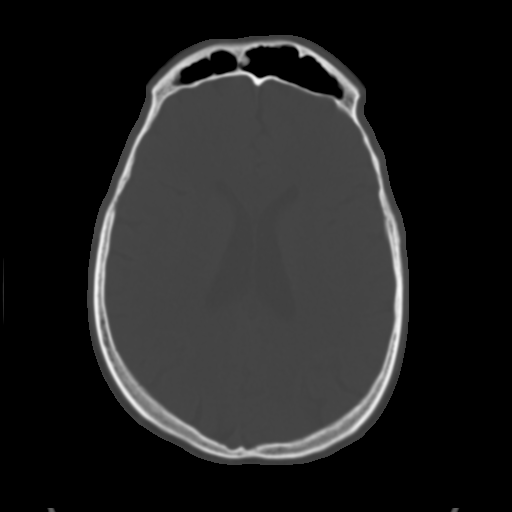
[im 28/38  brain]
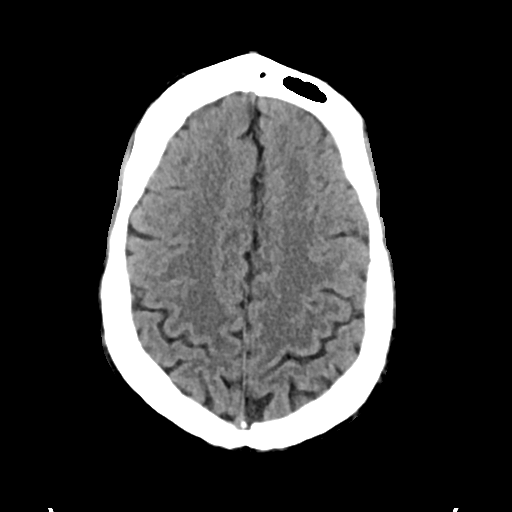
[im 33/38  brain]
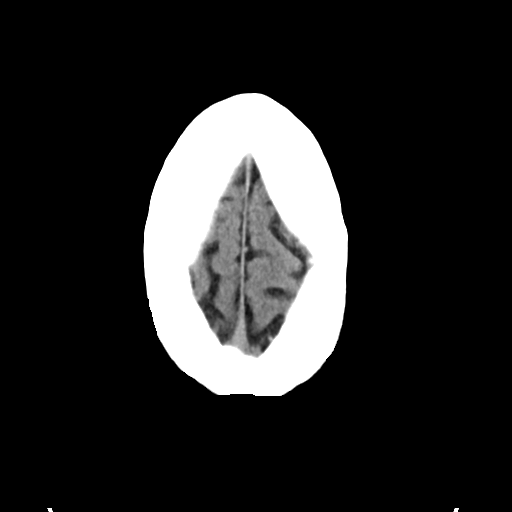

[Series 4: head bone · axial · 0.45mm/px · z∈[-91,-73]mm · 2 of 94 slices shown]
[im 10/94  bone]
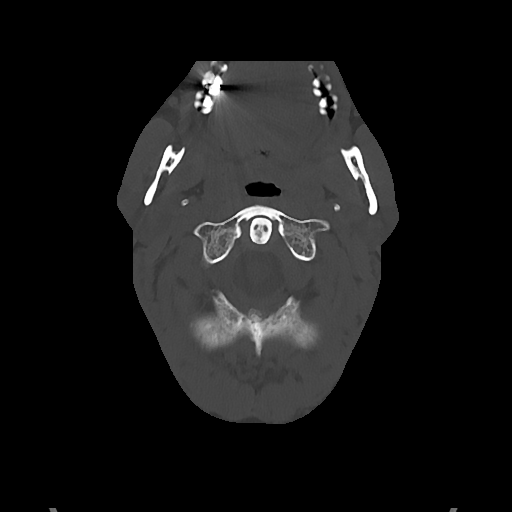
[im 19/94  bone]
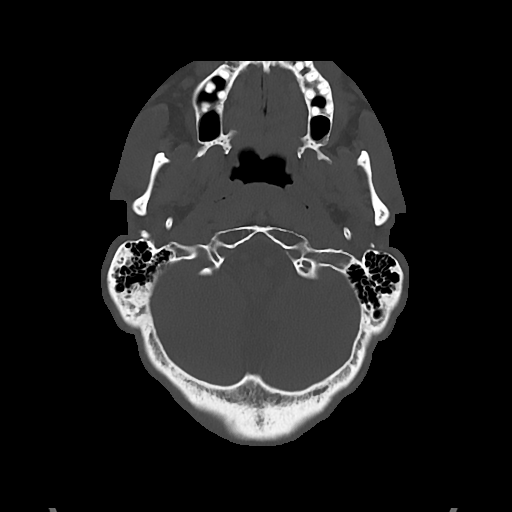

[Series 5: cor soft · coronal · 0.37mm/px · 3 of 82 slices shown]
[im 28/82  brain]
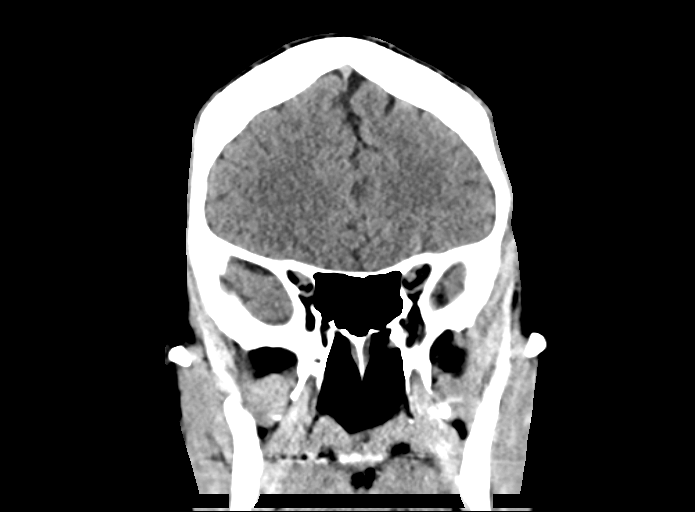
[im 37/82  brain]
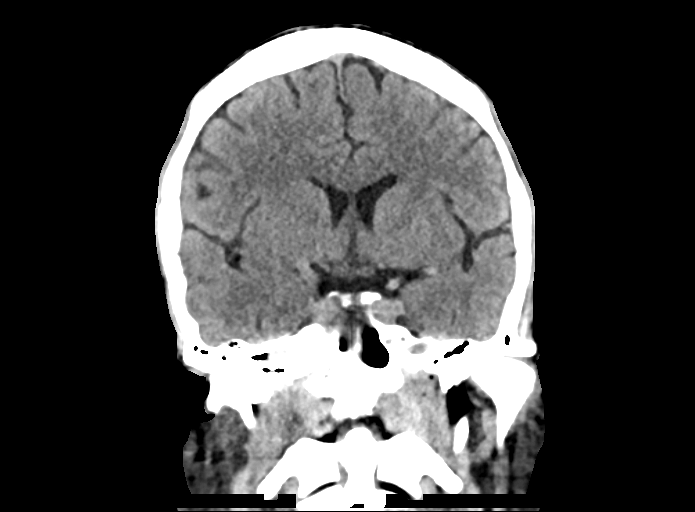
[im 46/82  brain]
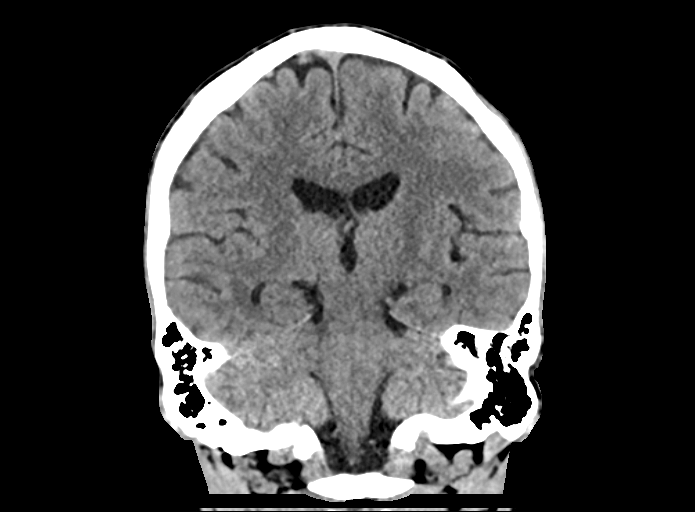

[Series 6: sag soft · sagittal · 0.37mm/px · 3 of 67 slices shown]
[im 23/67  brain]
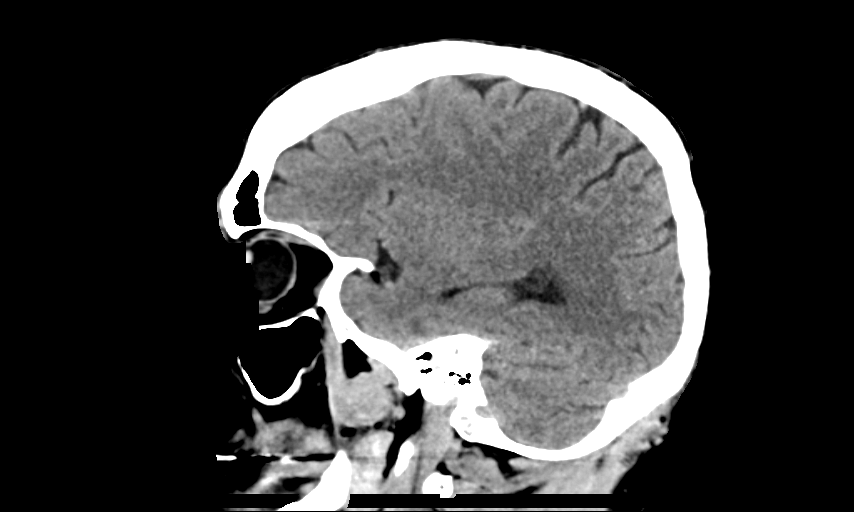
[im 34/67  brain]
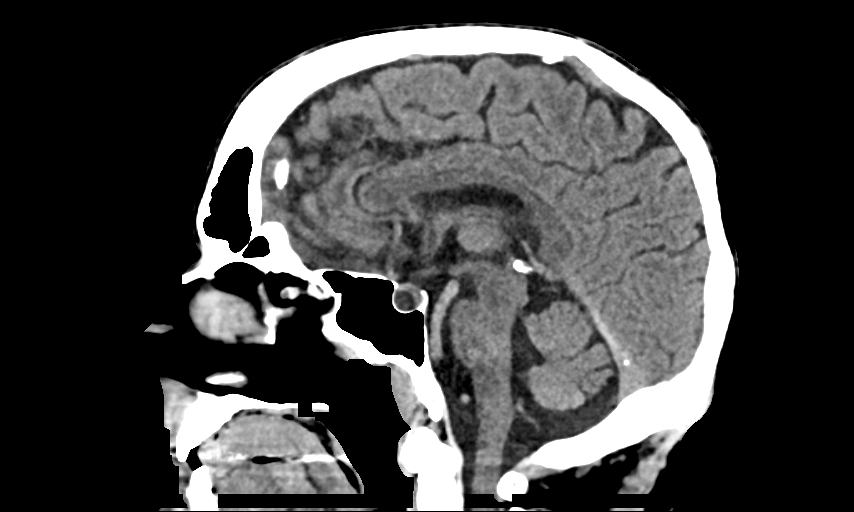
[im 45/67  brain]
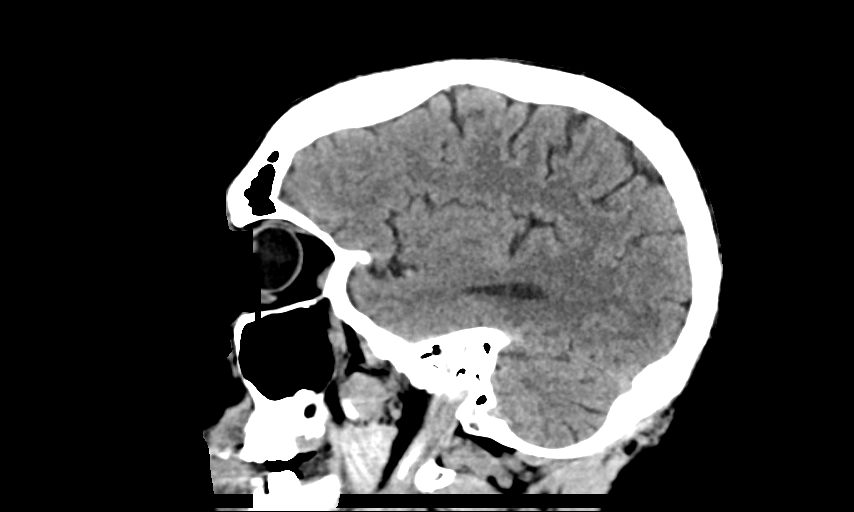

[15 of 47 positions shown; findings below may reference images not displayed]

FINDINGS: Brain: There is no mass, hemorrhage or extra-axial collection. The
size and configuration of the ventricles and extra-axial CSF spaces
are normal. The brain parenchyma is normal, without acute or chronic
infarction.

Vascular: No abnormal hyperdensity of the major intracranial
arteries or dural venous sinuses. No intracranial atherosclerosis.

Skull: The visualized skull base, calvarium and extracranial soft
tissues are normal.

Sinuses/Orbits: No fluid levels or advanced mucosal thickening of
the visualized paranasal sinuses. No mastoid or middle ear effusion.
The orbits are normal.
IMPRESSION: Normal head CT.

## 2019-05-05 MED ORDER — CHLORDIAZEPOXIDE HCL 25 MG PO CAPS
25.0000 mg | ORAL_CAPSULE | Freq: Once | ORAL | Status: AC
  Administered 2019-05-05: 25 mg via ORAL
  Filled 2019-05-05: qty 1

## 2019-05-05 MED ORDER — CHLORDIAZEPOXIDE HCL 25 MG PO CAPS: ORAL_CAPSULE | ORAL | 0 refills | Status: DC

## 2019-05-10 ENCOUNTER — Emergency Department (HOSPITAL_COMMUNITY)
Admission: EM | Admit: 2019-05-10 | Discharge: 2019-05-11 | Disposition: A | Discharge status: Home / Self Care | Payer: Self-pay | Attending: Emergency Medicine | Admitting: Emergency Medicine

## 2019-05-10 ENCOUNTER — Other Ambulatory Visit: Payer: Self-pay

## 2019-05-10 ENCOUNTER — Encounter (HOSPITAL_COMMUNITY): Payer: Self-pay | Admitting: Emergency Medicine

## 2019-05-10 DIAGNOSIS — Z21 Asymptomatic human immunodeficiency virus [HIV] infection status: Secondary | ICD-10-CM | POA: Insufficient documentation

## 2019-05-10 DIAGNOSIS — F419 Anxiety disorder, unspecified: Secondary | ICD-10-CM | POA: Insufficient documentation

## 2019-05-10 DIAGNOSIS — R55 Syncope and collapse: Secondary | ICD-10-CM | POA: Insufficient documentation

## 2019-05-10 DIAGNOSIS — E860 Dehydration: Secondary | ICD-10-CM | POA: Insufficient documentation

## 2019-05-10 DIAGNOSIS — F1010 Alcohol abuse, uncomplicated: Secondary | ICD-10-CM | POA: Insufficient documentation

## 2019-05-10 DIAGNOSIS — F17210 Nicotine dependence, cigarettes, uncomplicated: Secondary | ICD-10-CM | POA: Insufficient documentation

## 2019-05-10 DIAGNOSIS — Y907 Blood alcohol level of 200-239 mg/100 ml: Secondary | ICD-10-CM | POA: Insufficient documentation

## 2019-05-10 LAB — COMPREHENSIVE METABOLIC PANEL
ALT: 118 U/L — ABNORMAL HIGH (ref 0–44)
AST: 135 U/L — ABNORMAL HIGH (ref 15–41)
Albumin: 4.4 g/dL (ref 3.5–5.0)
Alkaline Phosphatase: 49 U/L (ref 38–126)
Anion gap: 12 (ref 5–15)
BUN: 8 mg/dL (ref 6–20)
CO2: 25 mmol/L (ref 22–32)
Calcium: 8.5 mg/dL — ABNORMAL LOW (ref 8.9–10.3)
Chloride: 104 mmol/L (ref 98–111)
Creatinine, Ser: 0.58 mg/dL — ABNORMAL LOW (ref 0.61–1.24)
GFR calc Af Amer: >60 mL/min (ref >60)
GFR calc non Af Amer: >60 mL/min (ref >60)
Glucose, Bld: 94 mg/dL (ref 70–99)
Potassium: 3.6 mmol/L (ref 3.5–5.1)
Sodium: 141 mmol/L (ref 135–145)
Total Bilirubin: 0.7 mg/dL (ref 0.3–1.2)
Total Protein: 7.7 g/dL (ref 6.5–8.1)

## 2019-05-10 LAB — CBC
HCT: 43 % (ref 39.0–52.0)
Hemoglobin: 14.8 g/dL (ref 13.0–17.0)
MCH: 33.3 pg (ref 26.0–34.0)
MCHC: 34.4 g/dL (ref 30.0–36.0)
MCV: 96.8 fL (ref 80.0–100.0)
Platelets: 108 10*3/uL — ABNORMAL LOW (ref 150–400)
RBC: 4.44 MIL/uL (ref 4.22–5.81)
RDW: 14.2 % (ref 11.5–15.5)
WBC: 3.2 10*3/uL — ABNORMAL LOW (ref 4.0–10.5)
nRBC: 0 % (ref 0.0–0.2)

## 2019-05-10 LAB — LIPASE, BLOOD: Lipase: 84 U/L — ABNORMAL HIGH (ref 11–51)

## 2019-05-10 MED ORDER — ONDANSETRON 4 MG PO TBDP: 4.0000 mg | ORAL_TABLET | Freq: Once | ORAL | Status: DC | PRN

## 2019-05-10 MED ORDER — SODIUM CHLORIDE 0.9% FLUSH: 3.0000 mL | Freq: Once | INTRAVENOUS | Status: DC

## 2019-05-10 NOTE — ED Triage Notes (Signed)
Patient brought in by [facility]. Patient is intoxicated and has been in the heat all day. Patient is complaining of being sick to his stomach.

## 2019-05-10 NOTE — ED Notes (Signed)
Bed: [facility]PT3 Expected date:  Expected time:  Means of arrival:  Comments: 

## 2019-05-11 LAB — CK: Total CK: 293 U/L (ref 49–397)

## 2019-05-11 LAB — ETHANOL: Alcohol, Ethyl (B): 209 mg/dL — ABNORMAL HIGH (ref <10)

## 2019-05-11 MED ORDER — SODIUM CHLORIDE 0.9 % IV BOLUS (SEPSIS)
1000.0000 mL | Freq: Once | INTRAVENOUS | Status: AC
  Administered 2019-05-11: 1000 mL via INTRAVENOUS

## 2019-05-11 MED ORDER — LORAZEPAM 2 MG/ML IJ SOLN
2.0000 mg | Freq: Once | INTRAMUSCULAR | Status: AC
  Administered 2019-05-11: 2 mg via INTRAVENOUS
  Filled 2019-05-11: qty 1

## 2019-05-11 MED ORDER — LACTATED RINGERS IV BOLUS
1000.0000 mL | Freq: Once | INTRAVENOUS | Status: AC
  Administered 2019-05-11: 1000 mL via INTRAVENOUS

## 2019-05-11 NOTE — ED Provider Notes (Signed)
Of note, patient has had multiple CT heads recently that were negative, defer further workup, no signs of head trauma   Ripley Fraise, MD 05/11/19 [phone]

## 2019-05-11 NOTE — ED Notes (Addendum)
Pt d/c home per MD order. Discharge summary reviewed with pt, along with local resources, pt verbalizes understanding. Sandwich and water provided per pt request. MD aware pt is being discharged at this time per order.  Ambulatory off unit. No s/s of acute distress noted.

## 2019-05-11 NOTE — ED Notes (Signed)
RN attempted to ambulate patient with MD in the room. Patient still shaking. MD instructed patient on walking.

## 2019-05-11 NOTE — ED Notes (Signed)
Pt very shaky and jumps. Pt stated "he is both hot and cold because I am going through withdrawals"

## 2019-05-11 NOTE — ED Notes (Addendum)
RN attempted to ambulate patient. Pt's heart rate jumped up to 135 and patient began shaking worse. Pt reported he felt he was going to fall. Pt's face became red and he reported difficulty staying upright. Pt sat back down.  MD made aware of patient event.

## 2019-05-11 NOTE — Discharge Instructions (Addendum)
Substance Abuse Treatment Programs ° °Intensive Outpatient Programs °[facility]     °[address]      °[place], [place]                   °[phone]      ° °[facility] °[address] °[place], [place] °[phone] ° °[facility]     °(Inpatient and outpatient)     °[address]           °[phone]   ° °[facility] °[phone] (Suboxone and Methadone) ° °[address]      °[place], [place] [address]      °336-882-2125      ° °[address] °[place], [place] °852-3033 ° °[facility] (Outpatient/Inpatient, Chemical)    °(insurance only) [phone]      °       °[facility]Groups & Residential) °[place], [place] °[phone] ° °   °[facility]     °[address]     °[place], [place]      °[phone]      ° °[facility] (for caregivers and family) °[address] °[place], [place] °336-299-4655 ° ° ° ° ° °Residential Treatment Programs °[facility]      °[address], [place], [place] [address]  °([phone]      ° °[facility] °[address] [place], [place] [address] °[phone] ° °[facility]        °336-248-8914      ° °[facility] °1-800-659-3381 ° °[facility]             °[address]                                         °[place], [place]                                                °877-615-2722 or 336-784-9470                              ° °[facility] [place] °[address] °[place], [address] °1.877.941.8954 ° °[facility]    °[address]      °[place], [place]     °[phone]      ° °[facility] °[address] °[place], [place] °336-285-9073 ° °[facility]   °[address]     °[place], [place] [address]     °[phone]      °Admissions: 8am-3pm M-F ° °[facility] °[address] °[place] °336-227-7417 ° °[facility]: Residential Program (90 Days)   °[place], [place]      °336-725-8389 or 800-758-6077    ° °[facility] °[place], [place] °(Walk in Hours over the weekend or by referral) ° °[facility] °[address], [place], [place] [address] °([phone] ° °Crisis Mobile: [facility]:  1-877-626-1772 (for crisis response 24 hours a day) °[facility] Hotline:      1-800-256-2452 °Outpatient Psychiatry and Counseling ° °[facility]: Mobile Crisis   Management 24 hours:  1-877-626-1772 ° °[facility] sliding scale fee and walk in schedule: M-F 8am-12pm/1pm-3pm °[address]  °[place], [place] [address] °[phone] ° °[facility] °[address] °[place], [place] [address] °336-703-9650 ° °[facility] ([facility] known [facility])- new patient walk-in appointments available Monday - Friday 8am -3pm.          °[address] °[place], [place] [address] °336-676-6840 or crisis line- 336-676-6905 ° °[facility]/ Intensive Outpatient Therapy Program °[address] °[place], [place] [address] °336-832-9804 ° °[facility]                  °Crisis Services      °[phone]      °[address]     °[place], [place] [address]                ° °[facility]   °[facility] °[phone] °[address] °[place], [place] [address] ° ° °[facility]          °[address],  °[place], [place] [address]       °([phone] ° °[facility] °[address] °[place], [place] [address] °336-292-1510 ° °[facility]    °[address]    °[place], [place] [address]     °[phone]      ° °[facility], Gary [name]     °[address]     °[place] [address]     °336-282-1251     °  °[facility] °[address] °[place] [address] ° °[facility]     °[address]     °[place], [place]      °336-542-2076      ° °[facility] °Gary Ahluwalia, Gary [name] °[address] °[place], [place] [address] °[phone] ° °[facility]     °[address]     °[place], [place] [address]     °336-274-1237      ° °[facility] °[address] °[place], [place] [address] °336-854-4450 °Resources for Temporary Residential Assistance/Crisis Centers ° °DAY CENTERS °[facility] °M-F 8am-3pm   °[address] [place], [place] [address]   336-332-0824 °Services include: laundry, barbering, support groups, case management, phone  & computer access, showers, AA/NA mtgs, mental health/substance abuse nurse, job skills class, disability information, VA assistance, spiritual classes, etc.  ° °HOMELESS SHELTERS ° °[facility]     °[facility]   °[address], [place]     °336.271.5959       °       °[facility] (women and children)       °[address] °[place], [place] [address] °336-275-0820 °[email or web address] for application and process °Application Required ° °[facility]   °[address]    °[place] [address]     °[phone]       °             °[facility] °[address] °,  [address] °336.273.5572 °336-235-0363(schedule application appt.) °Application Required ° °[facility] (women only)    °[address]     °[place],  [address]     °336-884-1039      °  Intake starts 6pm daily °Need valid ID, SSC, & Police report °[facility] [place] °[address] °[place], [place] °336-881-5420 °Application Required ° °[facility] (men only)     °[address]      °[place], [place]     °[phone]      ° °[facility] °(Pregnant women only) °[address] °[place], [place] °336-275-0206 ° °[facility]      °[address]      °[place], [place] [address]     °[phone]      °       °[facility] °[address] °[place], [place] °336-723-1848 °90 day commitment/SA/Application process ° °[facility](men only)     °[address]     °[place], [place]     °[phone]       °Check-in at 7pm     °       °[facility] °[address] °[place],  [address] °336-248-6684 °Men/Women/Women and Children must be there by 7 pm ° °[facility] °[place],  °336-722-8721                ° °

## 2019-05-11 NOTE — ED Provider Notes (Signed)
[facility] DEPT Provider Note   CSN: [id number] Arrival date & time: 05/10/19  2018     History   Chief Complaint Chief Complaint  Patient presents with  . Heat Exposure  . Alcohol Intoxication  . Nausea    HPI Gary [name] is a 33 y.o. male.     The history is provided by the patient.  Weakness Severity:  Moderate Onset quality:  Gradual Timing:  Constant Progression:  Worsening Chronicity:  New Context: alcohol use   Relieved by:  Nothing Worsened by:  Nothing Associated symptoms: nausea     Patient admits to daily alcohol use.  He reports he drinks a 12 pack/day. Last drink was approximately 1 PM on July 1.  He reports he was helping someone move a car when he felt lightheaded and passed out.  He reports he feels dehydrated and overheated. He denies any traumatic injury.  He now feels that he is undergoing alcohol withdrawal  Past Medical History:  Diagnosis Date  . Alcohol abuse   . HIV (human immunodeficiency virus infection) [facility])     Patient Active Problem List   Diagnosis Date Noted  . MDD (major depressive disorder), recurrent severe, without psychosis ([place]) 04/16/2019  . Alcohol dependence ([place]) 04/16/2019  . Major depressive disorder, recurrent severe without psychotic features ([place]) 04/16/2019  . HIV disease ([place]) 06/16/2018  . Polysubstance abuse ([place]) 06/16/2018  . Cigarette smoker 06/16/2018  . Penile cellulitis 06/15/2018    Past Surgical History:  Procedure Laterality Date  . INCISION AND DRAINAGE PERIRECTAL ABSCESS N/A 09/24/2016   Procedure: IRRIGATION AND DEBRIDEMENT PERIRECTAL ABSCESS;  Surgeon: Clayburn Pert, MD;  Location: [facility] ORS;  Service: General;  Laterality: N/A;  . none          Home Medications    Prior to Admission medications   Medication Sig Start Date End Date Taking? Authorizing Provider  chlordiazePOXIDE (LIBRIUM) 25 MG capsule 50mg  PO TID x 1D, then 25-50mg  PO BID X 1D, then  25-50mg  PO QD X 1D Patient not taking: Reported on 05/10/2019 05/05/19   Mesner, Corene Cornea, MD    Family History Family History  Problem Relation Age of Onset  . Alcohol abuse Mother   . Alcohol abuse Father     Social History Social History   Tobacco Use  . Smoking status: Current Every Day Smoker    Packs/day: 0.50    Types: Cigarettes  . Smokeless tobacco: Never Used  Substance Use Topics  . Alcohol use: Yes    Comment: 18pk/day  . Drug use: Yes    Types: Marijuana, Methamphetamines    Comment: last used 04/10/2019     Allergies   Caffeine   Review of Systems Review of Systems  Constitutional: Positive for fatigue.  Gastrointestinal: Positive for nausea.  Neurological: Positive for weakness.  All other systems reviewed and are negative.    Physical Exam Updated Vital Signs BP 116/75 (BP Location: Left Arm)   Pulse 81   Temp 98.2 F (36.8 C) (Oral)   Resp 16   Ht 1.829 m (6')   Wt 72.6 kg   SpO2 96%   BMI 21.70 kg/m   Physical Exam CONSTITUTIONAL: Disheveled, appears older than stated age HEAD: Normocephalic/atraumatic EYES: EOMI/PERRL ENMT: Mucous membranes dry NECK: supple no meningeal signs SPINE/BACK:entire spine nontender CV: S1/S2 noted, no murmurs/rubs/gallops noted LUNGS: Lungs are clear to auscultation bilaterally, no apparent distress ABDOMEN: soft, nontender NEURO: Pt is awake/alert/appropriate, moves all extremitiesx4.  No facial  droop.  Tremor noted in bilateral upper extremities EXTREMITIES: pulses normal/equal, full ROM, no signs of trauma, no deformities SKIN: warm, color normal, no bruising noted to body PSYCH: Anxious  ED Treatments / Results  Labs (all labs ordered are listed, but only abnormal results are displayed) Labs Reviewed  LIPASE, BLOOD - Abnormal; Notable for the following components:      Result Value   Lipase 84 (*)    All other components within normal limits  COMPREHENSIVE METABOLIC PANEL - Abnormal; Notable for  the following components:   Creatinine, Ser 0.58 (*)    Calcium 8.5 (*)    AST 135 (*)    ALT 118 (*)    All other components within normal limits  CBC - Abnormal; Notable for the following components:   WBC 3.2 (*)    Platelets 108 (*)    All other components within normal limits  ETHANOL - Abnormal; Notable for the following components:   Alcohol, Ethyl (B) 209 (*)    All other components within normal limits  CK    EKG EKG Interpretation  Date/Time:  Thursday May 11 2019 01:31:37 EDT Ventricular Rate:  94 PR Interval:    QRS Duration: 104 QT Interval:  390 QTC Calculation: 488 R Axis:   91 Text Interpretation:  Sinus rhythm Borderline right axis deviation Probable left ventricular hypertrophy Borderline T abnormalities, inferior leads Borderline prolonged QT interval Interpretation limited secondary to artifact No significant change since last tracing Confirmed by Zadie RhineWickline, Donald ([id number]) on 05/11/2019 2:13:39 AM   Radiology No results found.  Procedures Procedures   Medications Ordered in ED Medications  sodium chloride flush (NS) 0.9 % injection 3 mL (has no administration in time range)  ondansetron (ZOFRAN-ODT) disintegrating tablet 4 mg (has no administration in time range)  sodium chloride 0.9 % bolus 1,000 mL (0 mLs Intravenous Stopped 05/11/19 0313)  LORazepam (ATIVAN) injection 2 mg (2 mg Intravenous Given 05/11/19 0132)  sodium chloride 0.9 % bolus 1,000 mL (0 mLs Intravenous Stopped 05/11/19 0546)  lactated ringers bolus 1,000 mL (1,000 mLs Intravenous New Bag/Given 05/11/19 0549)     Initial Impression / Assessment and Plan / ED Course  I have reviewed the triage vital signs and the nursing notes.  Pertinent labs results that were available during my care of the patient were reviewed by me and considered in my medical decision making (see chart for details).       1:14 AM Patient with history of alcohol abuse presents with feelings of dehydration, feeling  overheated, and alcohol withdrawal.  Vitals are appropriate, but he does appear tremulous and dehydrated.  Labs and EKG are pending at this time 3:24 AM BP 123/78   Pulse 81   Temp 98 F (36.7 C) (Rectal)   Resp 18   Ht 1.829 m (6')   Wt 72.6 kg   SpO2 96%   BMI 21.70 kg/m  Vital signs improved, no hyperthermia.  Labs consistent with alcohol intoxication, no signs of rhabdomyolysis. When I enter room patient is sleeping, but he wakes up and starts moving his hand with a tremor Suspect his alcohol withdrawal is improving. He was just at behavioral health in early June to go to detox. We will continue to monitor but I anticipate discharge 7:07 AM Vitals are improved. Patient's tremor appears to worsen whenever I enter the room.  When Patient is alone he has no tremor at all. Patient had a slow gait, but no ataxia and was able to  ambulate independently. Low suspicion for acute alcohol withdrawal.  Vitals are improved. Given outpatient resources. Of note, patient has been documented on previous ED visits to have tremor whenever providers are evaluating patient and then it improves when he is alone Final Clinical Impressions(s) / ED Diagnoses   Final diagnoses:  Dehydration  Alcohol abuse    ED Discharge Orders    None       Zadie RhineWickline, Donald, MD 05/11/19 [phone]

## 2019-05-12 ENCOUNTER — Encounter (HOSPITAL_COMMUNITY): Payer: Self-pay

## 2019-05-12 ENCOUNTER — Other Ambulatory Visit: Payer: Self-pay

## 2019-05-12 ENCOUNTER — Emergency Department (HOSPITAL_COMMUNITY)
Admission: EM | Admit: 2019-05-12 | Discharge: 2019-05-13 | Disposition: A | Discharge status: Home / Self Care | Payer: Self-pay | Attending: Emergency Medicine | Admitting: Emergency Medicine

## 2019-05-12 ENCOUNTER — Emergency Department (HOSPITAL_COMMUNITY): Payer: Self-pay

## 2019-05-12 DIAGNOSIS — Z21 Asymptomatic human immunodeficiency virus [HIV] infection status: Secondary | ICD-10-CM | POA: Diagnosis present

## 2019-05-12 DIAGNOSIS — F1094 Alcohol use, unspecified with alcohol-induced mood disorder: Secondary | ICD-10-CM | POA: Diagnosis present

## 2019-05-12 DIAGNOSIS — R0789 Other chest pain: Secondary | ICD-10-CM | POA: Insufficient documentation

## 2019-05-12 DIAGNOSIS — Y907 Blood alcohol level of 200-239 mg/100 ml: Secondary | ICD-10-CM | POA: Insufficient documentation

## 2019-05-12 DIAGNOSIS — B20 Human immunodeficiency virus [HIV] disease: Secondary | ICD-10-CM | POA: Diagnosis present

## 2019-05-12 DIAGNOSIS — Z046 Encounter for general psychiatric examination, requested by authority: Secondary | ICD-10-CM | POA: Insufficient documentation

## 2019-05-12 DIAGNOSIS — F1014 Alcohol abuse with alcohol-induced mood disorder: Secondary | ICD-10-CM | POA: Diagnosis present

## 2019-05-12 DIAGNOSIS — F329 Major depressive disorder, single episode, unspecified: Secondary | ICD-10-CM | POA: Insufficient documentation

## 2019-05-12 DIAGNOSIS — F17210 Nicotine dependence, cigarettes, uncomplicated: Secondary | ICD-10-CM | POA: Insufficient documentation

## 2019-05-12 DIAGNOSIS — L559 Sunburn, unspecified: Secondary | ICD-10-CM | POA: Insufficient documentation

## 2019-05-12 DIAGNOSIS — R440 Auditory hallucinations: Secondary | ICD-10-CM | POA: Insufficient documentation

## 2019-05-12 DIAGNOSIS — Z03818 Encounter for observation for suspected exposure to other biological agents ruled out: Secondary | ICD-10-CM | POA: Insufficient documentation

## 2019-05-12 DIAGNOSIS — F1010 Alcohol abuse, uncomplicated: Secondary | ICD-10-CM | POA: Insufficient documentation

## 2019-05-12 DIAGNOSIS — R45851 Suicidal ideations: Secondary | ICD-10-CM | POA: Insufficient documentation

## 2019-05-12 LAB — COMPREHENSIVE METABOLIC PANEL
ALT: 114 U/L — ABNORMAL HIGH (ref 0–44)
AST: 118 U/L — ABNORMAL HIGH (ref 15–41)
Albumin: 4.5 g/dL (ref 3.5–5.0)
Alkaline Phosphatase: 49 U/L (ref 38–126)
Anion gap: 14 (ref 5–15)
BUN: 6 mg/dL (ref 6–20)
CO2: 24 mmol/L (ref 22–32)
Calcium: 8.8 mg/dL — ABNORMAL LOW (ref 8.9–10.3)
Chloride: 100 mmol/L (ref 98–111)
Creatinine, Ser: 0.63 mg/dL (ref 0.61–1.24)
GFR calc Af Amer: >60 mL/min (ref >60)
GFR calc non Af Amer: >60 mL/min (ref >60)
Glucose, Bld: 90 mg/dL (ref 70–99)
Potassium: 3.8 mmol/L (ref 3.5–5.1)
Sodium: 138 mmol/L (ref 135–145)
Total Bilirubin: 0.8 mg/dL (ref 0.3–1.2)
Total Protein: 7.5 g/dL (ref 6.5–8.1)

## 2019-05-12 LAB — CBC WITH DIFFERENTIAL/PLATELET
Abs Immature Granulocytes: 0.01 10*3/uL (ref 0.00–0.07)
Basophils Absolute: 0 10*3/uL (ref 0.0–0.1)
Basophils Relative: 1 %
Eosinophils Absolute: 0.1 10*3/uL (ref 0.0–0.5)
Eosinophils Relative: 4 %
HCT: 42 % (ref 39.0–52.0)
Hemoglobin: 14.4 g/dL (ref 13.0–17.0)
Immature Granulocytes: 0 %
Lymphocytes Relative: 36 %
Lymphs Abs: 1.1 10*3/uL (ref 0.7–4.0)
MCH: 33.3 pg (ref 26.0–34.0)
MCHC: 34.3 g/dL (ref 30.0–36.0)
MCV: 97 fL (ref 80.0–100.0)
Monocytes Absolute: 0.3 10*3/uL (ref 0.1–1.0)
Monocytes Relative: 8 %
Neutro Abs: 1.6 10*3/uL — ABNORMAL LOW (ref 1.7–7.7)
Neutrophils Relative %: 51 %
Platelets: 93 10*3/uL — ABNORMAL LOW (ref 150–400)
RBC: 4.33 MIL/uL (ref 4.22–5.81)
RDW: 13.9 % (ref 11.5–15.5)
WBC: 3.1 10*3/uL — ABNORMAL LOW (ref 4.0–10.5)
nRBC: 0 % (ref 0.0–0.2)

## 2019-05-12 LAB — ETHANOL: Alcohol, Ethyl (B): 204 mg/dL — ABNORMAL HIGH (ref <10)

## 2019-05-12 LAB — SARS CORONAVIRUS 2 BY RT PCR (HOSPITAL ORDER, PERFORMED IN ~~LOC~~ HOSPITAL LAB): SARS Coronavirus 2: NEGATIVE

## 2019-05-12 IMAGING — CR RIGHT RIBS AND CHEST - 3+ VIEW
5 series · 5 of 5 positions shown · non-contrast
Comparison: Radiographs [DATE].

CLINICAL DATA: Right rib pain.

EXAM:
RIGHT RIBS AND CHEST - 3+ VIEW

[w chest pa]
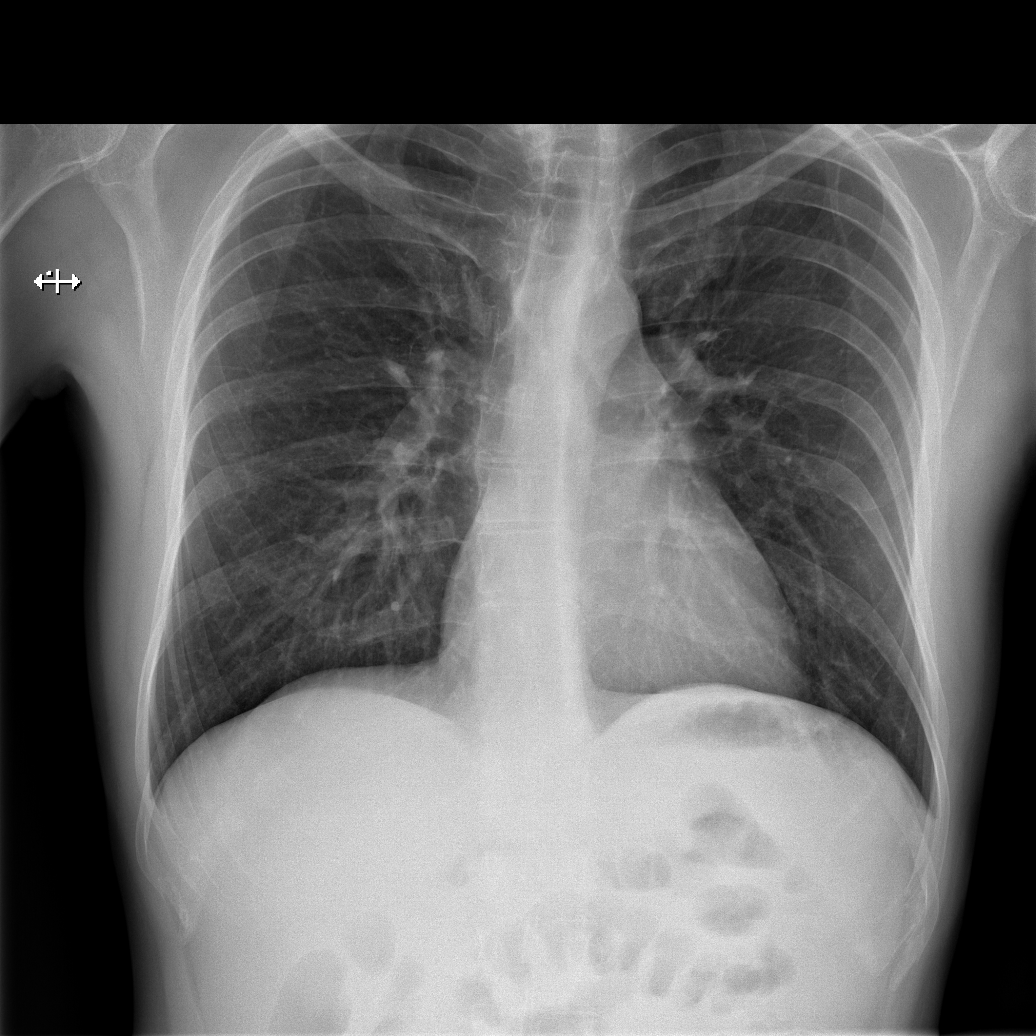

[w ribs ap upper right]
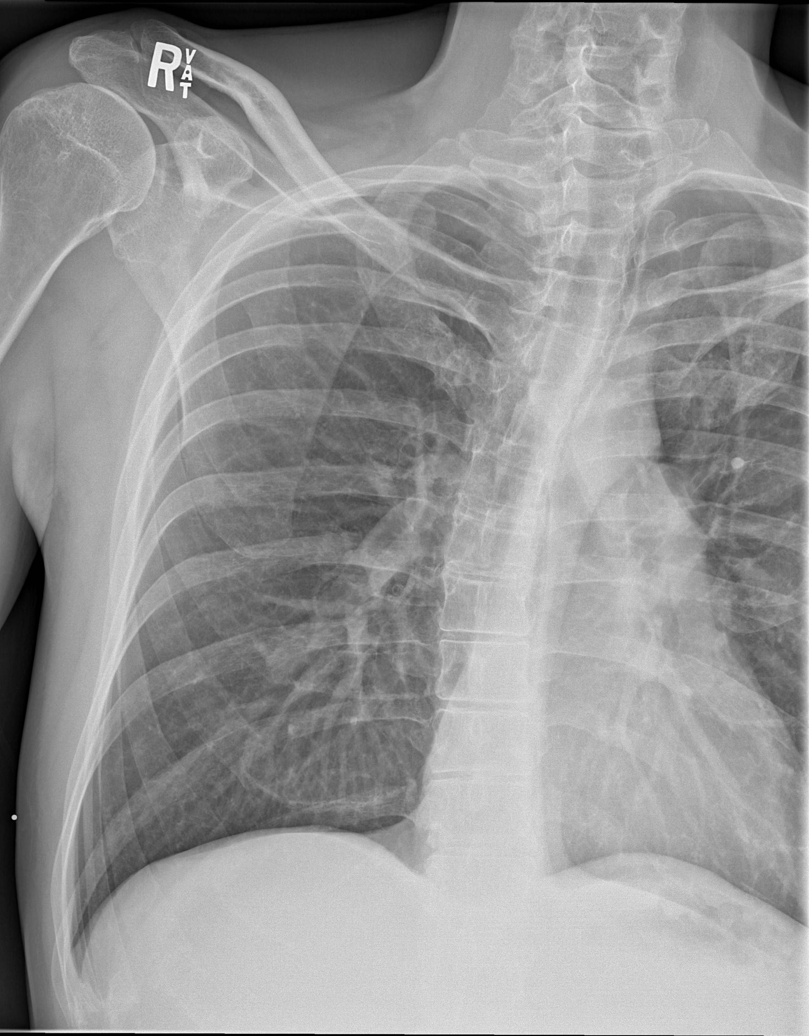

[w ribs ap lower right]
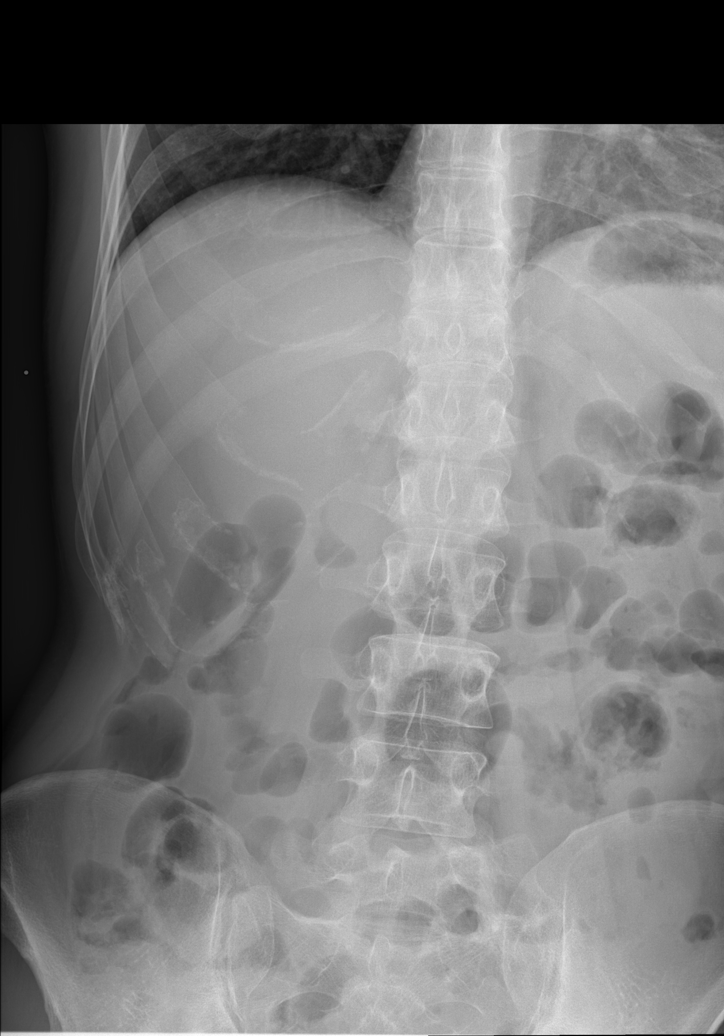

[w ribs obl right (1 of 2)]
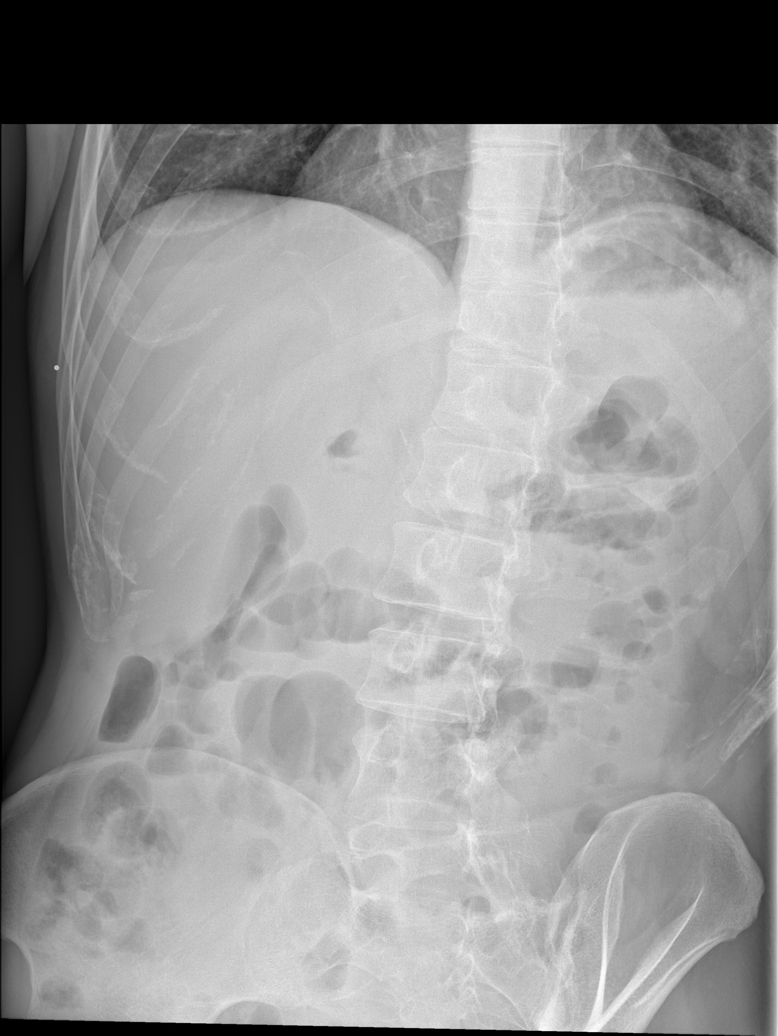

[w ribs obl right (2 of 2)]
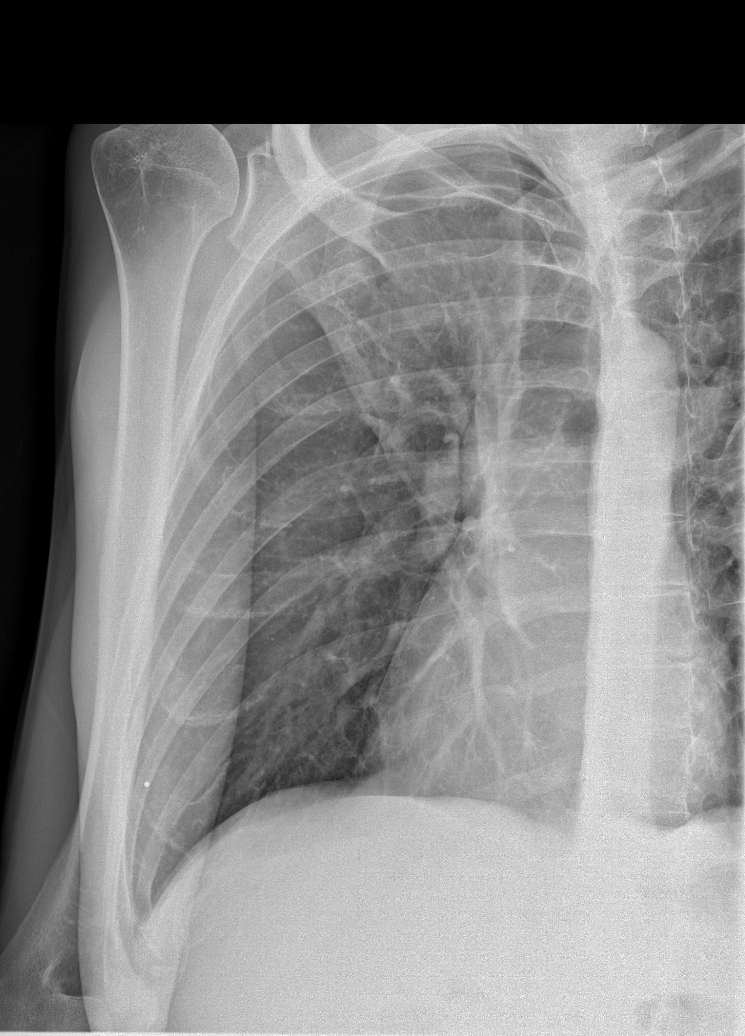

[5 of 5 positions shown; findings below may reference images not displayed]

FINDINGS: No fracture or other bone lesions are seen involving the ribs. There
is no evidence of pneumothorax or pleural effusion. Both lungs are
clear. Heart size and mediastinal contours are within normal limits.
IMPRESSION: Negative.

## 2019-05-12 MED ORDER — ACETAMINOPHEN 325 MG PO TABS
650.0000 mg | ORAL_TABLET | ORAL | Status: DC | PRN
  Administered 2019-05-12: 650 mg via ORAL
  Filled 2019-05-12: qty 2

## 2019-05-12 MED ORDER — PROMETHAZINE HCL 25 MG PO TABS
25.0000 mg | ORAL_TABLET | Freq: Once | ORAL | Status: AC
  Administered 2019-05-12: 25 mg via ORAL
  Filled 2019-05-12: qty 1

## 2019-05-12 MED ORDER — THIAMINE HCL 100 MG/ML IJ SOLN: 100.0000 mg | Freq: Every day | INTRAMUSCULAR | Status: DC

## 2019-05-12 MED ORDER — ALUM & MAG HYDROXIDE-SIMETH 200-200-20 MG/5ML PO SUSP
30.0000 mL | Freq: Four times a day (QID) | ORAL | Status: DC | PRN
  Administered 2019-05-12: 30 mL via ORAL
  Filled 2019-05-12: qty 30

## 2019-05-12 MED ORDER — LORAZEPAM 1 MG PO TABS
0.0000 mg | ORAL_TABLET | Freq: Two times a day (BID) | ORAL | Status: DC
  Administered 2019-05-12: 1 mg via ORAL

## 2019-05-12 MED ORDER — VITAMIN B-1 100 MG PO TABS
100.0000 mg | ORAL_TABLET | Freq: Every day | ORAL | Status: DC
  Administered 2019-05-12: 100 mg via ORAL
  Filled 2019-05-12: qty 1

## 2019-05-12 MED ORDER — NICOTINE 21 MG/24HR TD PT24
21.0000 mg | MEDICATED_PATCH | Freq: Every day | TRANSDERMAL | Status: DC
  Administered 2019-05-12: 21 mg via TRANSDERMAL
  Filled 2019-05-12: qty 1

## 2019-05-12 MED ORDER — ZOLPIDEM TARTRATE 5 MG PO TABS: 5.0000 mg | ORAL_TABLET | Freq: Every evening | ORAL | Status: DC | PRN

## 2019-05-12 MED ORDER — LORAZEPAM 2 MG/ML IJ SOLN: 0.0000 mg | Freq: Two times a day (BID) | INTRAMUSCULAR | Status: DC

## 2019-05-12 MED ORDER — LORAZEPAM 2 MG/ML IJ SOLN: 0.0000 mg | Freq: Four times a day (QID) | INTRAMUSCULAR | Status: DC

## 2019-05-12 MED ORDER — LORAZEPAM 1 MG PO TABS
0.0000 mg | ORAL_TABLET | Freq: Four times a day (QID) | ORAL | Status: DC
  Administered 2019-05-12: 1 mg via ORAL
  Administered 2019-05-13: 2 mg via ORAL
  Filled 2019-05-12 (×2): qty 1
  Filled 2019-05-12: qty 2

## 2019-05-12 MED ORDER — ONDANSETRON HCL 4 MG PO TABS
4.0000 mg | ORAL_TABLET | Freq: Three times a day (TID) | ORAL | Status: DC | PRN
  Administered 2019-05-12: 4 mg via ORAL
  Filled 2019-05-12: qty 1

## 2019-05-12 NOTE — ED Triage Notes (Signed)
Per EMS- Patient's friend called EMS because patient was lethargic. Patient states he had 2-3 40 ounce beers. Patient also reported that he was suicidal with no plan.  Patient also c/o right flank pain. Patient did not know if he fell or not.

## 2019-05-12 NOTE — BH Assessment (Addendum)
Tele Assessment Note   Patient Name: Gary [name] MRN: [id number] Referring Physician: Fayrene HelperBowie Tran, PA-C Location of Patient: [facility], [id number] Location of Provider: Behavioral Health TTS Department  Gary [name] is an 33 y.o. single male who presents unaccompanied to [facility] ED via EMS reporting alcohol use and suicidal ideation. Pt says he has a long history of alcohol use and today "I was trying to drink myself to death." He says he still is experiencing suicidal ideation. He reports he drank two 40-ounce beers and then a friend called EMS. He reports he is currently homeless and was very hot out in the sun. He says he is hearing a voice that is telling him to drink. He says he believe he fell and injured his ribs and reports right flank pain and nausea. Pt acknowledges symptoms including social withdrawal, loss of interest in usual pleasures, fatigue, decreased concentration, decreased sleep, decreased and feelings of hopelessness. He denies any history of previous suicide attempts. He denies any history of intentional self-injurious behavior. He denies visual hallucinations. He says he has abused substances in the past but denies use of any substances other than alcohol. Pt's blood alcohol level is 204 and urine drug screen is in process.  Pt identifies consequences of alcohol use as his primary stressor. He cannot is unemployed and has no Architectfinancial resources. He is HIV+. He cannot identify anyone in his life currently who is supportive. He denies legal problems. He denies access to firearms. He denies any history of abuse or trauma. He denies any current outpatient mental health or substance abuse providers. He confirms [facility]. He has also received substance abuse treatment at [facility].  Pt cannot identify anyone to contact for collateral information.  Pt is dressed in hospital scrubs and appears sunburned. He is alert and  oriented x4. Pt speaks in a clear tone, at low volume and normal pace. Motor behavior appears normal. Eye contact is good. Pt's mood is depressed and affect is congruent with mood. Thought process is coherent and relevant. There is no indication Pt is currently responding to internal stimuli or experiencing delusional thought content. Pt was calm and cooperative throughout assessment. He is requesting inpatient psychiatric treatment.   Diagnosis: F33.2 Major depressive disorder, Recurrent episode, Severe F10.20 Alcohol use disorder, Severe   Past Medical History:  Past Medical History:  Diagnosis Date  . Alcohol abuse   . HIV (human immunodeficiency virus infection) (HCC)     Past Surgical History:  Procedure Laterality Date  . INCISION AND DRAINAGE PERIRECTAL ABSCESS N/A 09/24/2016   Procedure: IRRIGATION AND DEBRIDEMENT PERIRECTAL ABSCESS;  Surgeon: Ricarda Frameharles Woodham, MD;  Location: [facility] ORS;  Service: General;  Laterality: N/A;  . none      Family History:  Family History  Problem Relation Age of Onset  . Alcohol abuse Mother   . Alcohol abuse Father     Social History:  reports that he has been smoking cigarettes. He has been smoking about 0.50 packs per day. He has never used smokeless tobacco. He reports current alcohol use. He reports current drug use. Drugs: Marijuana and Methamphetamines.  Additional Social History:  Alcohol / Drug Use Pain Medications: Denies use Prescriptions: Denies use Over the Counter: Denies use History of alcohol / drug use?: Yes Longest period of sobriety (when/how long): 1 week Negative Consequences of Use: Financial, Personal relationships, Work / School Withdrawal Symptoms: Nausea / Vomiting, Seizures, Sweats, Tremors Onset of  Seizures: Unknown Date of most recent seizure: 2 days ago Substance #1 Name of Substance 1: Alcohol 1 - Age of First Use: 19 1 - Amount (size/oz): 12-15 cans of beer 1 - Frequency: Daily when available 1 -  Duration: Ongoing for years 1 - Last Use / Amount: 05/12/19, two 40-ounce beers  CIWA: CIWA-Ar BP: 125/79 Pulse Rate: 87 Nausea and Vomiting: 2 Tactile Disturbances: none Tremor: not visible, but can be felt fingertip to fingertip Auditory Disturbances: not present Paroxysmal Sweats: no sweat visible Visual Disturbances: not present Anxiety: mildly anxious Headache, Fullness in Head: none present Agitation: normal activity Orientation and Clouding of Sensorium: oriented and can do serial additions CIWA-Ar Total: 4 COWS:    Allergies:  Allergies  Allergen Reactions  . Caffeine Palpitations    Home Medications: (Not in a hospital admission)   OB/GYN Status:  No LMP for male patient.  General Assessment Data Location of Assessment: [facility] TTS Assessment: In system Is this a Tele or Face-to-Face Assessment?: Tele Assessment Is this an Initial Assessment or a Re-assessment for this encounter?: Initial Assessment Patient Accompanied by:: N/A Language Other than English: No Living Arrangements: Homeless/Shelter What gender do you identify as?: Male Marital status: Single Maiden name: NA Pregnancy Status: No Living Arrangements: Other (Comment)(Homeless) Can pt return to current living arrangement?: Yes Admission Status: Voluntary Is patient capable of signing voluntary admission?: Yes Referral Source: Self/Family/Friend Insurance type: Self-pay     Crisis Care Plan Living Arrangements: Other (Comment)(Homeless) Legal Guardian: Other:(Self) Name of Psychiatrist: None Name of Therapist: None  Education Status Is patient currently in school?: No Is the patient employed, unemployed or receiving disability?: Unemployed  Risk to self with the past 6 months Suicidal Ideation: Yes-Currently Present Has patient been a risk to self within the past 6 months prior to admission? : Yes Suicidal Intent: No Has patient had any suicidal intent within the past 6 months prior to  admission? : No Is patient at risk for suicide?: Yes Suicidal Plan?: Yes-Currently Present Has patient had any suicidal plan within the past 6 months prior to admission? : Yes Specify Current Suicidal Plan: "Drink myself to death" Recently threatened to OD on medication Access to Means: Yes Specify Access to Suicidal Means: Access to alcohol What has been your use of drugs/alcohol within the last 12 months?: Pt reports drinking alcohol daily Previous Attempts/Gestures: No How many times?: 0 Other Self Harm Risks: Active addiction Triggers for Past Attempts: None known Intentional Self Injurious Behavior: None Family Suicide History: No Recent stressful life event(s): Financial Problems, Other (Comment)(Homeless, poor support) Persecutory voices/beliefs?: No Depression: Yes Depression Symptoms: Despondent, Isolating, Fatigue, Loss of interest in usual pleasures, Feeling worthless/self pity Substance abuse history and/or treatment for substance abuse?: Yes Suicide prevention information given to non-admitted patients: Not applicable  Risk to Others within the past 6 months Homicidal Ideation: No Does patient have any lifetime risk of violence toward others beyond the six months prior to admission? : No Thoughts of Harm to Others: No Current Homicidal Intent: No Current Homicidal Plan: No Access to Homicidal Means: No Identified Victim: None History of harm to others?: No Assessment of Violence: None Noted Violent Behavior Description: Pt denies history of violence Does patient have access to weapons?: No Criminal Charges Pending?: No Does patient have a court date: No Is patient on probation?: No  Psychosis Hallucinations: Auditory(Pt reports hearing a voice telling him to drink) Delusions: None noted  Mental Status Report Appearance/Hygiene: In scrubs Eye Contact: Fair  Motor Activity: Freedom of movement Speech: Logical/coherent Level of Consciousness: Alert Mood:  Depressed Affect: Depressed Anxiety Level: None Thought Processes: Coherent, Relevant Judgement: Partial Orientation: Person, Place, Time, Situation, Appropriate for developmental age Obsessive Compulsive Thoughts/Behaviors: None  Cognitive Functioning Concentration: Fair Memory: Recent Intact, Remote Intact Is patient IDD: No Insight: Fair Impulse Control: Fair Appetite: Fair Have you had any weight changes? : No Change Sleep: Decreased Total Hours of Sleep: 5 Vegetative Symptoms: None  ADLScreening [facility] Assessment Services) Patient's cognitive ability adequate to safely complete daily activities?: Yes Patient able to express need for assistance with ADLs?: Yes Independently performs ADLs?: Yes (appropriate for developmental age)  Prior Inpatient Therapy Prior Inpatient Therapy: Yes Prior Therapy Dates: 03/2019, 04/2019 Prior [facility]Provider([facility] Reason for Treatment: Detox and SA Treatment  Prior Outpatient Therapy Prior Outpatient Therapy: No Does patient have an ACCT team?: No Does patient have Intensive In-House Services?  : No Does patient have [facility] services? : No Does patient have P4CC services?: No  ADL Screening (condition at time of admission) Patient's cognitive ability adequate to safely complete daily activities?: Yes Is the patient deaf or have difficulty hearing?: No Does the patient have difficulty seeing, even when wearing glasses/contacts?: No Does the patient have difficulty concentrating, remembering, or making decisions?: No Patient able to express need for assistance with ADLs?: Yes Does the patient have difficulty dressing or bathing?: No Independently performs ADLs?: Yes (appropriate for developmental age) Does the patient have difficulty walking or climbing stairs?: No Weakness of Legs: None Weakness of Arms/Hands: None  Home Assistive Devices/Equipment Home Assistive Devices/Equipment: None    Abuse/Neglect  Assessment (Assessment to be complete while patient is alone) Abuse/Neglect Assessment Can Be Completed: Yes Physical Abuse: Denies Verbal Abuse: Denies Sexual Abuse: Denies Exploitation of patient/patient's resources: Denies Self-Neglect: Denies     Merchant navy officerAdvance Directives (For Healthcare) Does Patient Have a Medical Advance Directive?: No Would patient like information on creating a medical advance directive?: No - Patient declined          Disposition: Brook McNichol, [facility], confirmed [facility]. Gave clinical report to Reola Calkinsravis Money, NP who recommends Pt be observed overnight and evaluated by psychiatry in the morning. Notified Fayrene HelperBowie Tran, PA-C and Hansel StarlingLatricia London, RN of recommendation.  Disposition Initial Assessment Completed for this Encounter: Yes Patient referred to: Other (Comment)  This service was provided via telemedicine using a 2-way, interactive audio and video technology.  Names of all persons participating in this telemedicine service and their role in this encounter. Name: Gary [name] Role: Patient  Name: Shela CommonsFord Warrick Jr, [facility] Role: TTS counselor         Harlin RainFord Ellis Patsy BaltimoreWarrick Jr, [facility], [facility], [facility] Triage Specialist [phone]  Pamalee LeydenWarrick Jr, Ford Ellis 05/12/2019 7:41 PM

## 2019-05-12 NOTE — Progress Notes (Signed)
Received Gary [name] in his room awake at shift change with the sitter at the bedside. He has c/o right flank pain and was medicated per order. He was given fluids. Stated he started drinking when he was 33 yrs old. He is vomiting at 2133 hrs, waiting for orders. He a second episode  Of nausea anda  Small amount of vomiting  before80mn. He slept until 0500 hrs, his CIWA score was a repeat 11, medicated per order. His urine specimen was sent to the lab.

## 2019-05-12 NOTE — ED Provider Notes (Signed)
[place] DEPT Provider Note   CSN: [id number] Arrival date & time: 05/12/19  1649     History   Chief Complaint Chief Complaint  Patient presents with  . Alcohol Intoxication  . Suicidal    HPI Gary [name] is a 33 y.o. male.     The history is provided by the patient and medical records. No language interpreter was used.  Alcohol Intoxication     33 year old male with history of HIV, significant history of alcohol abuse, depression, brought here via EMS for evaluation of alcohol intoxication and suicidal ideation.  Patient states that he felt he is not important and therefore would like to drink himself to death.  He has been drinking a lot more lately.  States he drinks about 4 alcoholic beverages today.  He is hearing voices for the past several months telling him to drink more.  He is having pain to the right side of his chest and unsure if he may have fallen on it.  He denies any other pain.  He denies other drug use.  No report of homicidal ideation.  He report active SI.  Patient also complaining of sunburned skin as well as feeling dehydrated.  Past Medical History:  Diagnosis Date  . Alcohol abuse   . HIV (human immunodeficiency virus infection) [facility])     Patient Active Problem List   Diagnosis Date Noted  . MDD (major depressive disorder), recurrent severe, without psychosis ([place]) 04/16/2019  . Alcohol dependence ([place]) 04/16/2019  . Major depressive disorder, recurrent severe without psychotic features ([place]) 04/16/2019  . HIV disease ([place]) 06/16/2018  . Polysubstance abuse ([place]) 06/16/2018  . Cigarette smoker 06/16/2018  . Penile cellulitis 06/15/2018    Past Surgical History:  Procedure Laterality Date  . INCISION AND DRAINAGE PERIRECTAL ABSCESS N/A 09/24/2016   Procedure: IRRIGATION AND DEBRIDEMENT PERIRECTAL ABSCESS;  Surgeon: Clayburn Pert, MD;  Location: [facility] ORS;  Service: General;  Laterality: N/A;  . none           Home Medications    Prior to Admission medications   Medication Sig Start Date End Date Taking? Authorizing Provider  chlordiazePOXIDE (LIBRIUM) 25 MG capsule 50mg  PO TID x 1D, then 25-50mg  PO BID X 1D, then 25-50mg  PO QD X 1D Patient not taking: Reported on 05/10/2019 05/05/19   Mesner, Corene Cornea, MD    Family History Family History  Problem Relation Age of Onset  . Alcohol abuse Mother   . Alcohol abuse Father     Social History Social History   Tobacco Use  . Smoking status: Current Every Day Smoker    Packs/day: 0.50    Types: Cigarettes  . Smokeless tobacco: Never Used  Substance Use Topics  . Alcohol use: Yes  . Drug use: Yes    Types: Marijuana, Methamphetamines    Comment: last used 04/10/2019     Allergies   Caffeine   Review of Systems Review of Systems  All other systems reviewed and are negative.    Physical Exam Updated Vital Signs BP 125/79 (BP Location: Left Arm)   Pulse 87   Temp 98.9 F (37.2 C) (Oral)   Resp 18   Ht 6' (1.829 m)   Wt 72.6 kg   SpO2 95%   BMI 21.70 kg/m   Physical Exam Vitals signs and nursing note reviewed.  Constitutional:      General: He is not in acute distress.    Appearance: He is well-developed.  HENT:  Head: Atraumatic.  Eyes:     Conjunctiva/sclera: Conjunctivae normal.  Neck:     Musculoskeletal: Neck supple.  Cardiovascular:     Rate and Rhythm: Normal rate and regular rhythm.     Pulses: Normal pulses.     Heart sounds: Normal heart sounds.  Pulmonary:     Effort: Pulmonary effort is normal.     Breath sounds: Normal breath sounds. No wheezing, rhonchi or rales.  Chest:     Chest wall: Tenderness (Tenderness to right anterior chest wall on palpation without any crepitus or bruising or emphysema.) present.  Abdominal:     Palpations: Abdomen is soft.     Tenderness: There is no abdominal tenderness.  Skin:    Findings: No rash.     Comments: Sunburned skin noted  Neurological:     Mental  Status: He is alert and oriented to person, place, and time.     GCS: GCS eye subscore is 4. GCS verbal subscore is 5. GCS motor subscore is 6.     Cranial Nerves: Cranial nerves are intact.     Sensory: Sensation is intact.  Psychiatric:        Attention and Perception: Attention normal.        Mood and Affect: Mood normal.        Speech: Speech normal.        Behavior: Behavior is cooperative.        Thought Content: Thought content includes suicidal ideation. Thought content does not include homicidal ideation.      ED Treatments / Results  Labs (all labs ordered are listed, but only abnormal results are displayed) Labs Reviewed  COMPREHENSIVE METABOLIC PANEL - Abnormal; Notable for the following components:      Result Value   Calcium 8.8 (*)    AST 118 (*)    ALT 114 (*)    All other components within normal limits  ETHANOL - Abnormal; Notable for the following components:   Alcohol, Ethyl (B) 204 (*)    All other components within normal limits  CBC WITH DIFFERENTIAL/PLATELET - Abnormal; Notable for the following components:   WBC 3.1 (*)    Platelets 93 (*)    Neutro Abs 1.6 (*)    All other components within normal limits  SARS CORONAVIRUS 2 (HOSPITAL ORDER, PERFORMED IN [facility])  RAPID URINE DRUG SCREEN, HOSP PERFORMED    EKG None  Radiology Dg Ribs Unilateral W/chest Right  Result Date: 05/12/2019 CLINICAL DATA:  Right rib pain. EXAM: RIGHT RIBS AND CHEST - 3+ VIEW COMPARISON:  Radiographs of October 04, 2016. FINDINGS: No fracture or other bone lesions are seen involving the ribs. There is no evidence of pneumothorax or pleural effusion. Both lungs are clear. Heart size and mediastinal contours are within normal limits. IMPRESSION: Negative. Electronically Signed   By: Lupita RaiderJames  Green Jr M.D.   On: 05/12/2019 18:32    Procedures Procedures (including critical care time)  Medications Ordered in ED Medications  LORazepam (ATIVAN) injection 0-4  mg (0 mg Intravenous Not Given 05/13/19 0001)    Or  LORazepam (ATIVAN) tablet 0-4 mg ( Oral See Alternative 05/13/19 0001)  LORazepam (ATIVAN) injection 0-4 mg ( Intravenous See Alternative 05/12/19 2233)    Or  LORazepam (ATIVAN) tablet 0-4 mg (1 mg Oral Given 05/12/19 2233)  thiamine (VITAMIN B-1) tablet 100 mg (100 mg Oral Given 05/12/19 1937)    Or  thiamine (B-1) injection 100 mg ( Intravenous See Alternative 05/12/19  1937)  acetaminophen (TYLENOL) tablet 650 mg (650 mg Oral Given 05/12/19 1956)  zolpidem (AMBIEN) tablet 5 mg (has no administration in time range)  ondansetron (ZOFRAN) tablet 4 mg (4 mg Oral Given 05/12/19 1956)  alum & mag hydroxide-simeth (MAALOX/MYLANTA) 200-200-20 MG/5ML suspension 30 mL (30 mLs Oral Given 05/12/19 1955)  nicotine (NICODERM CQ - dosed in mg/24 hours) patch 21 mg (21 mg Transdermal Patch Applied 05/12/19 1938)  promethazine (PHENERGAN) tablet 25 mg (25 mg Oral Given 05/12/19 2233)     Initial Impression / Assessment and Plan / ED Course  I have reviewed the triage vital signs and the nursing notes.  Pertinent labs & imaging results that were available during my care of the patient were reviewed by me and considered in my medical decision making (see chart for details).        BP 124/85 (BP Location: Left Arm)   Pulse 76   Temp 98.6 F (37 C) (Oral)   Resp 18   Ht 6' (1.829 m)   Wt 72.6 kg   SpO2 97%   BMI 21.70 kg/m    Final Clinical Impressions(s) / ED Diagnoses   Final diagnoses:  Suicidal ideation  Alcohol abuse    ED Discharge Orders    None     5:29 PM Patient with known history of alcohol abuse presenting with suicidal ideation with plan to drink himself to death.  Last drink was today.  He has been seen multiple times in the ED for alcohol intoxication.  Work-up initiated.  Current alcohol is 204. Evidence of transaminitis with AST 118, ALT 114, likely 2/2 alcohol abuse. covid-19 test is negative.   Gary [name] was evaluated in  Emergency Department on 05/13/2019 for the symptoms described in the history of present illness. He was evaluated in the context of the global COVID-19 pandemic, which necessitated consideration that the patient might be at risk for infection with the SARS-CoV-2 virus that causes COVID-19. Institutional protocols and algorithms that pertain to the evaluation of patients at risk for COVID-19 are in a state of rapid change based on information released by regulatory bodies including the CDC and federal and state organizations. These policies and algorithms were followed during the patient's care in the ED.   7:33 PM TTS has evaluated pt and recommend observed overnight and psychiatry to assess in the AM. Pt otherwise medically cleared.    Fayrene Helperran, [place], PA-C 05/13/19 0100    Bethann BerkshireZammit, Joseph, MD 05/14/19 1007

## 2019-05-13 DIAGNOSIS — F1094 Alcohol use, unspecified with alcohol-induced mood disorder: Secondary | ICD-10-CM | POA: Diagnosis present

## 2019-05-13 DIAGNOSIS — F1014 Alcohol abuse with alcohol-induced mood disorder: Secondary | ICD-10-CM

## 2019-05-13 LAB — RAPID URINE DRUG SCREEN, HOSP PERFORMED
Amphetamines: NOT DETECTED
Barbiturates: NOT DETECTED
Benzodiazepines: POSITIVE — AB
Cocaine: NOT DETECTED
Opiates: NOT DETECTED
Tetrahydrocannabinol: NOT DETECTED

## 2019-05-13 NOTE — [facility] Suicide Risk Assessment (Signed)
Suicide Risk Assessment  Discharge Assessment   [facility] Discharge Suicide Risk Assessment   Principal Problem: Alcohol abuse with alcohol-induced mood disorder [facility]) Discharge Diagnoses: Principal Problem:   Alcohol abuse with alcohol-induced mood disorder ([place]) Active Problems:   HIV disease ([place])   Total Time spent with patient: 30 minutes  Musculoskeletal: Strength & Muscle Tone: within normal limits Gait & Station: normal Patient leans: N/A  Psychiatric Specialty Exam:   Blood pressure 125/83, pulse 80, temperature 98.9 F (37.2 C), temperature source Oral, resp. rate 18, height 6' (1.829 m), weight 72.6 kg, SpO2 99 %.Body mass index is 21.7 kg/m.  General Appearance: Casual  Eye Contact::  Good  Speech:  Clear and Coherent and Normal Rate409  Volume:  Normal  Mood:  Euthymic  Affect:  Congruent  Thought Process:  Coherent, Goal Directed and Descriptions of Associations: Intact  Orientation:  Full (Time, Place, and Person)  Thought Content:  Logical  Suicidal Thoughts:  No  Homicidal Thoughts:  No  Memory:  Immediate;   Good Recent;   Good Remote;   Fair  Judgement:  Fair  Insight:  Fair  Psychomotor Activity:  Normal  Concentration:  Good  Recall:  Good  Fund of Knowledge:Good  Language: Good  Akathisia:  Negative  Handed:  Right  AIMS (if indicated):     Assets:  Communication Skills Desire for Improvement Physical Health  Sleep:     Cognition: WNL  ADL's:  Intact   Mental Status Per Nursing Assessment::   On Admission:    Pt was seen and chart reviewed with treatment team and Dr Darleene Cleaver. Pt denies suicidal/homicidal ideation, denies auditory/visual hallucinations and does not appear to be responding to internal stimuli. Pt admitted with suicidal ideation while intoxicated. Pt's BAL 204, UDS positive for benzos (hospital prescribed) on admission. Pt is currently homeless. Pt has had 10 ED and 1 inpatient admission in the past 6 months. He has a history of  withdrawal seizures. He also has a history of falling. He was complaining of right rib pain on admission. His rib x-rays were negative for fractures. He is HIV positive but is currently not taking his medications. He stated he is safe to be discharged. He has a plan to go to [facility] for assistance with getting an ID, help with his alcohol use disorder and appointments at the [facility]. Pt is psychiatrically clear.   Demographic Factors:  Male, Caucasian, Low socioeconomic status and Unemployed  Loss Factors: Financial problems/change in socioeconomic status  Historical Factors: Family history of mental illness or substance abuse  Risk Reduction Factors:   Sense of responsibility to family  Continued Clinical Symptoms:  Alcohol/Substance Abuse/Dependencies  Cognitive Features That Contribute To Risk:  Closed-mindedness    Suicide Risk:  Minimal: No identifiable suicidal ideation.  Patients presenting with no risk factors but with morbid ruminations; may be classified as minimal risk based on the severity of the depressive symptoms    Plan Of Care/Follow-up recommendations:  Activity:  as tolerated Diet:  heart healthy  Ethelene Hal, NP 05/13/2019, 10:27 AM

## 2019-05-13 NOTE — Consult Note (Addendum)
[facility] Psych ED Discharge  05/13/2019 10:41 AM Gary [name]  MRN:  [id number] Principal Problem: Alcohol abuse with alcohol-induced mood disorder [facility](HCC) Discharge Diagnoses: Principal Problem:   Alcohol abuse with alcohol-induced mood disorder (HCC) Active Problems:   HIV disease (HCC)   Subjective:      Pt was seen and chart reviewed with treatment team and Dr Jannifer FranklinAkintayo. Pt denies suicidal/homicidal ideation, denies auditory/visual hallucinations and does not appear to be responding to internal stimuli. Pt admitted with suicidal ideation while intoxicated. Pt's BAL 204, UDS positive for benzos (hospital prescribed) on admission. Pt is currently homeless. Pt has had 10 ED and 1 inpatient admission in the past 6 months. [facility] and on discharge refused any substance abuse follow up information or appointments. He has a history of withdrawal seizures. He also has a history of falling. He was complaining of right rib pain on admission. His rib x-rays were negative for fractures. He is HIV positive but is currently not taking his medications. He stated he is safe to be discharged. [facility] with getting an ID, help with his alcohol use disorder and appointments at the [facility]. Pt is psychiatrically clear.  Total Time spent with patient: 30 minutes  Past Psychiatric History: As above  Past Medical History:  Past Medical History:  Diagnosis Date  . Alcohol abuse   . HIV (human immunodeficiency virus infection) (HCC)     Past Surgical History:  Procedure Laterality Date  . INCISION AND DRAINAGE PERIRECTAL ABSCESS N/A 09/24/2016   Procedure: IRRIGATION AND DEBRIDEMENT PERIRECTAL ABSCESS;  Surgeon: Ricarda Frameharles Woodham, MD;  Location: [facility] ORS;  Service: General;  Laterality: N/A;  . none     Family History:  Family History  Problem Relation Age of Onset  . Alcohol abuse Mother   . Alcohol abuse Father    Family Psychiatric  History: Pt  did not provide this information Social History:  Social History   Substance and Sexual Activity  Alcohol Use Yes     Social History   Substance and Sexual Activity  Drug Use Yes  . Types: Marijuana, Methamphetamines   Comment: last used 04/10/2019    Social History   Socioeconomic History  . Marital status: Single    Spouse name: Not on file  . Number of children: Not on file  . Years of education: Not on file  . Highest education level: Not on file  Occupational History  . Not on file  Social Needs  . Financial resource strain: Not on file  . Food insecurity    Worry: Not on file    Inability: Not on file  . Transportation needs    Medical: Not on file    Non-medical: Not on file  Tobacco Use  . Smoking status: Current Every Day Smoker    Packs/day: 0.50    Types: Cigarettes  . Smokeless tobacco: Never Used  Substance and Sexual Activity  . Alcohol use: Yes  . Drug use: Yes    Types: Marijuana, Methamphetamines    Comment: last used 04/10/2019  . Sexual activity: Never  Lifestyle  . Physical activity    Days per week: Not on file    Minutes per session: Not on file  . Stress: Not on file  Relationships  . Social Musicianconnections    Talks on phone: Not on file    Gets together: Not on file    Attends religious  service: Not on file    Active member of club or organization: Not on file    Attends meetings of clubs or organizations: Not on file    Relationship status: Not on file  Other Topics Concern  . Not on file  Social History Narrative   Staying with a friend   Independent at baseline    Has this patient used any form of tobacco in the last 30 days? (Cigarettes, Smokeless Tobacco, Cigars, and/or Pipes) Prescription not provided because: Pt declined  Current Medications: Current Facility-Administered Medications  Medication Dose Route Frequency Provider Last Rate Last Dose  . acetaminophen (TYLENOL) tablet 650 mg  650 mg Oral Q4H PRN Domenic Moras, PA-C    650 mg at 05/12/19 1956  . alum & mag hydroxide-simeth (MAALOX/MYLANTA) 200-200-20 MG/5ML suspension 30 mL  30 mL Oral Q6H PRN Domenic Moras, PA-C   30 mL at 05/12/19 1955  . LORazepam (ATIVAN) injection 0-4 mg  0-4 mg Intravenous Q6H Domenic Moras, PA-C       Or  . LORazepam (ATIVAN) tablet 0-4 mg  0-4 mg Oral Q6H Domenic Moras, PA-C   2 mg at 05/13/19 0524  . [START ON 05/15/2019] LORazepam (ATIVAN) injection 0-4 mg  0-4 mg Intravenous Q12H Domenic Moras, PA-C       Or  . Derrill Memo ON 05/15/2019] LORazepam (ATIVAN) tablet 0-4 mg  0-4 mg Oral Q12H Domenic Moras, PA-C   1 mg at 05/12/19 2233  . nicotine (NICODERM CQ - dosed in mg/24 hours) patch 21 mg  21 mg Transdermal Daily Domenic Moras, PA-C   21 mg at 05/12/19 1938  . ondansetron (ZOFRAN) tablet 4 mg  4 mg Oral Q8H PRN Domenic Moras, PA-C   4 mg at 05/12/19 1956  . thiamine (VITAMIN B-1) tablet 100 mg  100 mg Oral Daily Domenic Moras, PA-C   100 mg at 05/12/19 1751   Or  . thiamine (B-1) injection 100 mg  100 mg Intravenous Daily Domenic Moras, PA-C      . zolpidem (AMBIEN) tablet 5 mg  5 mg Oral QHS PRN Domenic Moras, PA-C       No current outpatient medications on file.    Musculoskeletal: Strength & Muscle Tone: within normal limits Gait & Station: normal Patient leans: N/A  Psychiatric Specialty Exam:   Blood pressure 125/83, pulse 80, temperature 98.9 F (37.2 C), temperature source Oral, resp. rate 18, height 6' (1.829 m), weight 72.6 kg, SpO2 99 %.Body mass index is 21.7 kg/m.  General Appearance: Casual  Eye Contact::  Good  Speech:  Clear and Coherent and Normal Rate409  Volume:  Normal  Mood:  Euthymic  Affect:  Congruent  Thought Process:  Coherent, Goal Directed and Descriptions of Associations: Intact  Orientation:  Full (Time, Place, and Person)  Thought Content:  Logical  Suicidal Thoughts:  No  Homicidal Thoughts:  No  Memory:  Immediate;   Good Recent;   Good Remote;   Fair  Judgement:  Fair  Insight:  Fair  Psychomotor  Activity:  Normal  Concentration:  Good  Recall:  Good  Fund of Knowledge:Good  Language: Good  Akathisia:  Negative  Handed:  Right  AIMS (if indicated):     Assets:  Communication Skills Desire for Improvement Physical Health  Sleep:     Cognition: WNL  ADL's:  Intact       Demographic Factors:  Male, Caucasian, Low socioeconomic status and Unemployed  Loss Factors: Legal issues and Financial problems/change in  socioeconomic status  Historical Factors: Family history of mental illness or substance abuse  Risk Reduction Factors:   Sense of responsibility to family  Continued Clinical Symptoms:  Alcohol/Substance Abuse/Dependencies  Cognitive Features That Contribute To Risk:  Closed-mindedness    Suicide Risk:  Minimal: No identifiable suicidal ideation.  Patients presenting with no risk factors but with morbid ruminations; may be classified as minimal risk based on the severity of the depressive symptoms    Plan Of Care/Follow-up recommendations:  Activity:  as tolerated Diet:  heart Healthy  Disposition and Treatment Plan: Alcohol abuse with alcohol-induced mood disorder (HCC) Take all medications as prescribed. Keep all follow-up appointments as scheduled.  Do not consume alcohol or use illegal drugs while on prescription medications. Report any adverse effects from your medications to your primary care provider promptly.  In the event of recurrent symptoms or worsening symptoms, call 911, a crisis hotline, or go to the nearest emergency department for evaluation.   Laveda AbbeLaurie Britton Parks, NP 05/13/2019, 10:41 AM  Patient seen face-to-face for psychiatric evaluation, chart reviewed and case discussed with the physician extender and developed treatment plan. Reviewed the information documented and agree with the treatment plan. Thedore MinsMojeed , MD

## 2019-05-14 ENCOUNTER — Emergency Department (HOSPITAL_COMMUNITY)
Admission: EM | Admit: 2019-05-14 | Discharge: 2019-05-14 | Disposition: A | Discharge status: Home / Self Care | Payer: Self-pay | Attending: Emergency Medicine | Admitting: Emergency Medicine

## 2019-05-14 ENCOUNTER — Other Ambulatory Visit: Payer: Self-pay

## 2019-05-14 DIAGNOSIS — F1010 Alcohol abuse, uncomplicated: Secondary | ICD-10-CM

## 2019-05-14 DIAGNOSIS — F17210 Nicotine dependence, cigarettes, uncomplicated: Secondary | ICD-10-CM | POA: Insufficient documentation

## 2019-05-14 DIAGNOSIS — F10230 Alcohol dependence with withdrawal, uncomplicated: Secondary | ICD-10-CM | POA: Insufficient documentation

## 2019-05-14 DIAGNOSIS — B20 Human immunodeficiency virus [HIV] disease: Secondary | ICD-10-CM | POA: Insufficient documentation

## 2019-05-14 MED ORDER — CHLORDIAZEPOXIDE HCL 25 MG PO CAPS: ORAL_CAPSULE | ORAL | 0 refills | Status: DC

## 2019-05-14 MED ORDER — FAMOTIDINE 20 MG PO TABS: 20.0000 mg | ORAL_TABLET | Freq: Two times a day (BID) | ORAL | 0 refills | Status: DC

## 2019-05-14 MED ORDER — ONDANSETRON 4 MG PO TBDP
4.0000 mg | ORAL_TABLET | Freq: Once | ORAL | Status: AC
  Administered 2019-05-14: 4 mg via ORAL
  Filled 2019-05-14: qty 1

## 2019-05-14 MED ORDER — PROMETHAZINE HCL 25 MG PO TABS: 25.0000 mg | ORAL_TABLET | Freq: Four times a day (QID) | ORAL | 0 refills | Status: DC | PRN

## 2019-05-14 MED ORDER — LORAZEPAM 1 MG PO TABS
1.0000 mg | ORAL_TABLET | Freq: Once | ORAL | Status: AC
  Administered 2019-05-14: 1 mg via ORAL
  Filled 2019-05-14: qty 1

## 2019-05-14 NOTE — ED Notes (Signed)
Pt vomited.   

## 2019-05-14 NOTE — ED Provider Notes (Addendum)
[place] DEPT Provider Note   CSN: [id number] Arrival date & time: 05/14/19  1635    History   Chief Complaint Chief Complaint  Patient presents with  . Alcohol Intoxication    HPI Gary [name] is a 33 y.o. male.     HPI Patient presents with alcohol abuse and abdominal pain/withdrawal.  States he has messed up his detox.  He was seen in the ER yesterday by psychiatry and discharge.  Has had frequent visits to the ER for this.  States this is not much different.  Had some mild suicidal thoughts but not active suicidal plan.  States he is just upset that he has been drinking again.  States he drank 2 to 324 ounce beers today.  Dull pain in upper abdomen.  No fevers or chills.  Has HIV infection but not been taking his medications.  Reportedly refused some recent detox resources and plans. Past Medical History:  Diagnosis Date  . Alcohol abuse   . HIV (human immunodeficiency virus infection) [facility])     Patient Active Problem List   Diagnosis Date Noted  . Alcohol abuse with alcohol-induced mood disorder ([place]) 05/13/2019  . MDD (major depressive disorder), recurrent severe, without psychosis ([place]) 04/16/2019  . Alcohol dependence ([place]) 04/16/2019  . Major depressive disorder, recurrent severe without psychotic features ([place]) 04/16/2019  . HIV disease ([place]) 06/16/2018  . Polysubstance abuse ([place]) 06/16/2018  . Cigarette smoker 06/16/2018  . Penile cellulitis 06/15/2018    Past Surgical History:  Procedure Laterality Date  . INCISION AND DRAINAGE PERIRECTAL ABSCESS N/A 09/24/2016   Procedure: IRRIGATION AND DEBRIDEMENT PERIRECTAL ABSCESS;  Surgeon: Clayburn Pert, MD;  Location: [facility] ORS;  Service: General;  Laterality: N/A;  . none          Home Medications    Prior to Admission medications   Medication Sig Start Date End Date Taking? Authorizing Provider  chlordiazePOXIDE (LIBRIUM) 25 MG capsule 50mg  PO TID x 1D, then 25-50mg  PO  BID X 1D, then 25-50mg  PO QD X 1D 05/14/19   Davonna Belling, MD  famotidine (PEPCID) 20 MG tablet Take 1 tablet (20 mg total) by mouth 2 (two) times daily. 05/14/19   Davonna Belling, MD  promethazine (PHENERGAN) 25 MG tablet Take 1 tablet (25 mg total) by mouth every 6 (six) hours as needed for nausea. 05/14/19   Davonna Belling, MD    Family History Family History  Problem Relation Age of Onset  . Alcohol abuse Mother   . Alcohol abuse Father     Social History Social History   Tobacco Use  . Smoking status: Current Every Day Smoker    Packs/day: 0.50    Types: Cigarettes  . Smokeless tobacco: Never Used  Substance Use Topics  . Alcohol use: Yes  . Drug use: Yes    Types: Marijuana, Methamphetamines    Comment: last used 04/10/2019     Allergies   Caffeine   Review of Systems Review of Systems  Constitutional: Negative for appetite change, fatigue and fever.  Gastrointestinal: Positive for abdominal pain, nausea and vomiting.  Genitourinary: Negative for flank pain.  Musculoskeletal: Negative for gait problem.  Skin: Negative for rash.  Neurological: Positive for tremors.  Psychiatric/Behavioral: Positive for suicidal ideas.     Physical Exam Updated Vital Signs BP 112/82 (BP Location: Left Arm)   Pulse (!) 103   Temp 98.2 F (36.8 C) (Oral)   Resp 18   Ht 6\' 1"  (1.854 m)  Wt 68 kg   SpO2 100%   BMI 19.79 kg/m   Physical Exam Vitals signs and nursing note reviewed.  HENT:     Head: Normocephalic.     Mouth/Throat:     Mouth: Mucous membranes are moist.  Cardiovascular:     Rate and Rhythm: Normal rate and regular rhythm.  Abdominal:     Comments: Mild upper abdominal tenderness without rebound or guarding.  Musculoskeletal: Normal range of motion.  Skin:    General: Skin is warm.     Capillary Refill: Capillary refill takes less than 2 seconds.  Neurological:     Mental Status: He is alert.      ED Treatments / Results  Labs (all labs  ordered are listed, but only abnormal results are displayed) Labs Reviewed - No data to display  EKG None  Radiology No results found.  Procedures Procedures (including critical care time)  Medications Ordered in ED Medications  LORazepam (ATIVAN) tablet 1 mg (has no administration in time range)     Initial Impression / Assessment and Plan / ED Course  I have reviewed the triage vital signs and the nursing notes.  Pertinent labs & imaging results that were available during my care of the patient were reviewed by me and considered in my medical decision making (see chart for details).        Patient with alcohol abuse.  History of same.  Reviewed recent labs and recent psychiatric recommendations.  Will treat symptomatically for withdrawal since he has symptomatic withdrawal in the past.  We will also give some Pepcid to help with potential alcoholic gastritis.  I think pancreatitis is less likely.  Tolerated orals.  Discharge home with resources. Does not appear to be actively suicidal at this time.  Final Clinical Impressions(s) / ED Diagnoses   Final diagnoses:  Alcohol abuse    ED Discharge Orders         Ordered    famotidine (PEPCID) 20 MG tablet  2 times daily     05/14/19 2003    chlordiazePOXIDE (LIBRIUM) 25 MG capsule     05/14/19 2003    promethazine (PHENERGAN) 25 MG tablet  Every 6 hours PRN     05/14/19 Danie Binder2003           Pickering, Nathan, MD 05/14/19 2006    Benjiman CorePickering, Nathan, MD 05/14/19 2007

## 2019-05-14 NOTE — ED Notes (Signed)
Pt sleeping. 

## 2019-05-14 NOTE — ED Notes (Addendum)
Pt twitching and tremulous.  A&O x 3, no distress noted, calm & cooperative.  Dr Alvino Chapel notified.  Pt tolerating po fluids, no vomiting noted.

## 2019-05-14 NOTE — ED Notes (Signed)
Pt A&O x 3, no distress noted, twitching and anxious.  Dr Alvino Chapel notified.  MD states to come re-eval pt.  Monitoring for safety.  Sitter at bedside.

## 2019-05-14 NOTE — ED Notes (Signed)
Pt given sandwich and water.

## 2019-05-14 NOTE — ED Triage Notes (Signed)
Pt brought to hospital by EMS. Pt to room 31. EMS said pt stated he drank 3x24 oz beers today. Pt has not eaten or drank anything else other then alcohol. Pt given sandwich and water.  Pt dressed out. Pt stated having SI thoughts.

## 2019-06-01 ENCOUNTER — Other Ambulatory Visit: Payer: Self-pay

## 2019-06-01 ENCOUNTER — Emergency Department (HOSPITAL_COMMUNITY)
Admission: EM | Admit: 2019-06-01 | Discharge: 2019-06-02 | Disposition: A | Discharge status: Home / Self Care | Payer: Self-pay | Attending: Emergency Medicine | Admitting: Emergency Medicine

## 2019-06-01 ENCOUNTER — Telehealth: Payer: Self-pay | Admitting: Pharmacy Technician

## 2019-06-01 ENCOUNTER — Encounter (HOSPITAL_COMMUNITY): Payer: Self-pay

## 2019-06-01 DIAGNOSIS — R1084 Generalized abdominal pain: Secondary | ICD-10-CM | POA: Insufficient documentation

## 2019-06-01 DIAGNOSIS — F17210 Nicotine dependence, cigarettes, uncomplicated: Secondary | ICD-10-CM | POA: Insufficient documentation

## 2019-06-01 DIAGNOSIS — F1510 Other stimulant abuse, uncomplicated: Secondary | ICD-10-CM | POA: Insufficient documentation

## 2019-06-01 DIAGNOSIS — B20 Human immunodeficiency virus [HIV] disease: Secondary | ICD-10-CM | POA: Insufficient documentation

## 2019-06-01 DIAGNOSIS — Y907 Blood alcohol level of 200-239 mg/100 ml: Secondary | ICD-10-CM | POA: Insufficient documentation

## 2019-06-01 DIAGNOSIS — F1210 Cannabis abuse, uncomplicated: Secondary | ICD-10-CM | POA: Insufficient documentation

## 2019-06-01 DIAGNOSIS — F10920 Alcohol use, unspecified with intoxication, uncomplicated: Secondary | ICD-10-CM | POA: Insufficient documentation

## 2019-06-01 DIAGNOSIS — K529 Noninfective gastroenteritis and colitis, unspecified: Secondary | ICD-10-CM | POA: Insufficient documentation

## 2019-06-01 LAB — CBC WITH DIFFERENTIAL/PLATELET
Abs Immature Granulocytes: 0.01 10*3/uL (ref 0.00–0.07)
Basophils Absolute: 0 10*3/uL (ref 0.0–0.1)
Basophils Relative: 1 %
Eosinophils Absolute: 0.2 10*3/uL (ref 0.0–0.5)
Eosinophils Relative: 2 %
HCT: 43.3 % (ref 39.0–52.0)
Hemoglobin: 14.4 g/dL (ref 13.0–17.0)
Immature Granulocytes: 0 %
Lymphocytes Relative: 13 %
Lymphs Abs: 0.9 10*3/uL (ref 0.7–4.0)
MCH: 33 pg (ref 26.0–34.0)
MCHC: 33.3 g/dL (ref 30.0–36.0)
MCV: 99.1 fL (ref 80.0–100.0)
Monocytes Absolute: 0.6 10*3/uL (ref 0.1–1.0)
Monocytes Relative: 8 %
Neutro Abs: 5.6 10*3/uL (ref 1.7–7.7)
Neutrophils Relative %: 76 %
Platelets: 258 10*3/uL (ref 150–400)
RBC: 4.37 MIL/uL (ref 4.22–5.81)
RDW: 14 % (ref 11.5–15.5)
WBC: 7.4 10*3/uL (ref 4.0–10.5)
nRBC: 0 % (ref 0.0–0.2)

## 2019-06-01 LAB — COMPREHENSIVE METABOLIC PANEL
ALT: 46 U/L — ABNORMAL HIGH (ref 0–44)
AST: 45 U/L — ABNORMAL HIGH (ref 15–41)
Albumin: 4 g/dL (ref 3.5–5.0)
Alkaline Phosphatase: 56 U/L (ref 38–126)
Anion gap: 12 (ref 5–15)
BUN: 8 mg/dL (ref 6–20)
CO2: 26 mmol/L (ref 22–32)
Calcium: 8.5 mg/dL — ABNORMAL LOW (ref 8.9–10.3)
Chloride: 100 mmol/L (ref 98–111)
Creatinine, Ser: 0.7 mg/dL (ref 0.61–1.24)
GFR calc Af Amer: >60 mL/min (ref >60)
GFR calc non Af Amer: >60 mL/min (ref >60)
Glucose, Bld: 85 mg/dL (ref 70–99)
Potassium: 3.1 mmol/L — ABNORMAL LOW (ref 3.5–5.1)
Sodium: 138 mmol/L (ref 135–145)
Total Bilirubin: 0.5 mg/dL (ref 0.3–1.2)
Total Protein: 7.3 g/dL (ref 6.5–8.1)

## 2019-06-01 LAB — SALICYLATE LEVEL: Salicylate Lvl: <7 mg/dL (ref 2.8–30.0)

## 2019-06-01 LAB — ETHANOL: Alcohol, Ethyl (B): 226 mg/dL — ABNORMAL HIGH (ref <10)

## 2019-06-01 LAB — ACETAMINOPHEN LEVEL: Acetaminophen (Tylenol), Serum: <10 ug/mL — ABNORMAL LOW (ref 10–30)

## 2019-06-01 LAB — LIPASE, BLOOD: Lipase: 71 U/L — ABNORMAL HIGH (ref 11–51)

## 2019-06-01 MED ORDER — FAMOTIDINE IN NACL 20-0.9 MG/50ML-% IV SOLN
20.0000 mg | Freq: Once | INTRAVENOUS | Status: AC
  Administered 2019-06-01: 20 mg via INTRAVENOUS
  Filled 2019-06-01: qty 50

## 2019-06-01 MED ORDER — ONDANSETRON HCL 4 MG/2ML IJ SOLN
4.0000 mg | Freq: Once | INTRAMUSCULAR | Status: AC
  Administered 2019-06-01: 4 mg via INTRAVENOUS
  Filled 2019-06-01: qty 2

## 2019-06-01 NOTE — ED Provider Notes (Signed)
[facility]-EMERGENCY DEPT Provider Note   CSN: [id number] Arrival date & time: 06/01/19  2134    History   Chief Complaint Chief Complaint  Patient presents with  . Abdominal Pain  . Alcohol Intoxication    HPI Gary [name] is a 33 y.o. male with a history of homelessness, alcohol abuse perirectal abscesses, and HIV who presents to the emergency department by EMS who presents to the emergency department with a chief complaint of vomiting.  EMS reports the patient was found in a [facility] hunched over a trash can and vomiting.  The patient reports that he drank three 40 oz beers early today.  He typically drinks four and a half 40 oz beers daily.  Last drink was several hours prior to arrival.  He reports that his soon as he finished taking the last sip of his last beer that he began vomiting.  He reports multiple episodes of vomiting.  He also endorses generalized abdominal pain that started earlier today.  He reports that he has been having some loose stools over the last few days, but tonight began having frequent episodes of watery diarrhea.  No hematemesis, melena, hematochezia, constipation, fever, chills, shortness of breath, chest pain, dysuria, hematuria.  No treatment prior to arrival.  History is somewhat limited secondary to cooperation of the patient.      The history is provided by the patient. No language interpreter was used.    Past Medical History:  Diagnosis Date  . Alcohol abuse   . HIV (human immunodeficiency virus infection) [facility](HCC)     Patient Active Problem List   Diagnosis Date Noted  . Alcohol abuse with alcohol-induced mood disorder (HCC) 05/13/2019  . MDD (major depressive disorder), recurrent severe, without psychosis (HCC) 04/16/2019  . Alcohol dependence (HCC) 04/16/2019  . 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 HIV disease (HCC) 06/16/2018  . Polysubstance abuse (HCC)  06/16/2018  . Cigarette smoker 06/16/2018  . Penile cellulitis 06/15/2018    Past Surgical History:  Procedure Laterality Date  . INCISION AND DRAINAGE PERIRECTAL ABSCESS N/A 09/24/2016   Procedure: IRRIGATION AND DEBRIDEMENT PERIRECTAL ABSCESS;  Surgeon: Ricarda Frameharles Woodham, MD;  Location: [facility] ORS;  Service: General;  Laterality: N/A;  . none          Home Medications    Prior to Admission medications   Medication Sig Start Date End Date Taking? Authorizing Provider  chlordiazePOXIDE (LIBRIUM) 25 MG capsule 50mg  PO TID x 1D, then 25-50mg  PO BID X 1D, then 25-50mg  PO QD X 1D 06/02/19   McDonald, Mia A, PA-C  loperamide (IMODIUM) 2 MG capsule Take 1 capsule (2 mg total) by mouth 4 (four) times daily as needed for diarrhea or loose stools. 06/02/19   McDonald, Mia A, PA-C  promethazine (PHENERGAN) 25 MG tablet Take 1 tablet (25 mg total) by mouth every 6 (six) hours as needed for nausea. 06/02/19   McDonald, Mia A, PA-C  famotidine (PEPCID) 20 MG tablet Take 1 tablet (20 mg total) by mouth 2 (two) times daily. Patient not taking: Reported on 06/01/2019 05/14/19 06/02/19  Benjiman CorePickering, Nathan, MD    Family History Family History  Problem Relation Age of Onset  . Alcohol abuse Mother   . Alcohol abuse Father     Social History Social History   Tobacco Use  . Smoking status: Current Every Day Smoker    Packs/day: 0.50    Types: Cigarettes  . Smokeless tobacco: Never Used  Substance Use Topics  . Alcohol use: Yes    Alcohol/week: 3.0 standard drinks    Types: 3 Cans of beer per week    Comment: 24 oz  . Drug use: Yes    Types: Marijuana, Methamphetamines    Comment: last used 04/10/2019     Allergies   Caffeine   Review of Systems Review of Systems  Constitutional: Negative for appetite change, chills and fever.  HENT: Negative for congestion and sore throat.   Eyes: Negative for visual disturbance.  Respiratory: Negative for shortness of breath.   Cardiovascular: Negative  for chest pain.  Gastrointestinal: Positive for abdominal pain, diarrhea, nausea and vomiting. Negative for anal bleeding, blood in stool, constipation and rectal pain.  Genitourinary: Negative for dysuria, frequency, penile pain, penile swelling, scrotal swelling, testicular pain and urgency.  Musculoskeletal: Negative for back pain.  Skin: Negative for rash.  Allergic/Immunologic: Negative for immunocompromised state.  Neurological: Negative for dizziness, weakness, numbness and headaches.  Psychiatric/Behavioral: Negative for confusion.     Physical Exam Updated Vital Signs BP 107/74   Pulse 78   Temp 98.2 F (36.8 C) (Oral)   Resp (!) 24   SpO2 97%   Physical Exam Vitals signs and nursing note reviewed.  Constitutional:      General: He is not in acute distress.    Appearance: He is well-developed. He is not ill-appearing, toxic-appearing or diaphoretic.     Comments: Appears much older than stated age  HENT:     Head: Normocephalic.  Eyes:     Conjunctiva/sclera: Conjunctivae normal.     Comments: Bilateral eyes are injected  Neck:     Musculoskeletal: Neck supple.  Cardiovascular:     Rate and Rhythm: Normal rate and regular rhythm.     Pulses: Normal pulses.     Heart sounds: Normal heart sounds. No murmur. No friction rub. No gallop.   Pulmonary:     Effort: Pulmonary effort is normal. No respiratory distress.     Breath sounds: Normal breath sounds. No stridor. No wheezing, rhonchi or rales.  Chest:     Chest wall: No tenderness.  Abdominal:     General: There is no distension.     Palpations: Abdomen is soft. There is no mass.     Tenderness: There is abdominal tenderness. There is no right CVA tenderness, left CVA tenderness, guarding or rebound.     Hernia: No hernia is present.     Comments: Limited abdominal exam secondary to patient compliance.   Mild diffuse tenderness throughout the abdomen with significant tenderness to palpation in the left lower  quadrant.  No tenderness over McBurney's point.  Negative Murphy sign.  Unable to assess for CVA tenderness.  Unable to assess for rebound as patient will only allow me to palpate the abdomen once.  He has guarded the abdomen throughout the exam.  Skin:    General: Skin is warm and dry.  Neurological:     Mental Status: He is alert.     Comments: Speech is not slurred.  Psychiatric:        Behavior: Behavior normal.      ED Treatments / Results  Labs (all labs ordered are listed, but only abnormal results are displayed) Labs Reviewed  ETHANOL - Abnormal; Notable for the following components:      Result Value   Alcohol, Ethyl (B) 226 (*)    All other components within normal limits  COMPREHENSIVE METABOLIC PANEL - Abnormal; Notable for  the following components:   Potassium 3.1 (*)    Calcium 8.5 (*)    AST 45 (*)    ALT 46 (*)    All other components within normal limits  LIPASE, BLOOD - Abnormal; Notable for the following components:   Lipase 71 (*)    All other components within normal limits  ACETAMINOPHEN LEVEL - Abnormal; Notable for the following components:   Acetaminophen (Tylenol), Serum <10 (*)    All other components within normal limits  CBC WITH DIFFERENTIAL/PLATELET  SALICYLATE LEVEL    EKG None  Radiology Ct Abdomen Pelvis W Contrast  Result Date: 06/02/2019 CLINICAL DATA:  33 year old male with nausea vomiting. History of HIV and alcoholism. EXAM: CT ABDOMEN AND PELVIS WITH CONTRAST TECHNIQUE: Multidetector CT imaging of the abdomen and pelvis was performed using the standard protocol following bolus administration of intravenous contrast. CONTRAST:  100mL OMNIPAQUE IOHEXOL 300 MG/ML  SOLN COMPARISON:  CT abdomen pelvis dated 09/24/2016 FINDINGS: Lower chest: The visualized lung bases are clear. No intra-abdominal free air or free fluid. Hepatobiliary: Probable mild fatty infiltration of the liver. No intrahepatic biliary ductal dilatation. The gallbladder is  unremarkable. Pancreas: Unremarkable. No pancreatic ductal dilatation or surrounding inflammatory changes. Spleen: Normal in size without focal abnormality. Adrenals/Urinary Tract: The adrenal glands, kidneys, and the visualized ureters appear unremarkable. The urinary bladder is partially distended. There is apparent diffuse thickening of the bladder wall which may be partly related to underdistention. Cystitis is not excluded. Correlation with urinalysis recommended. Stomach/Bowel: Multiple thickened and inflamed loops of small bowel in the mid to lower abdomen with fluid content most consistent with enteritis. There is no bowel obstruction. Loose stool within the colon compatible with diarrheal state. The appendix is normal. Vascular/Lymphatic: The abdominal aorta and IVC are unremarkable. No portal venous gas. There is no adenopathy. Reproductive: The prostate and seminal vesicles are grossly unremarkable. No pelvic mass. Other: No abdominal wall hernia or abnormality. No abdominopelvic ascites. Musculoskeletal: No acute or significant osseous findings. IMPRESSION: Enteritis with diarrheal state. Correlation with clinical exam and stool cultures recommended. No bowel obstruction. Normal appendix. Electronically Signed   By: Elgie CollardArash  Radparvar M.D.   On: 06/02/2019 02:26    Procedures Procedures (including critical care time)  Medications Ordered in ED Medications  sodium chloride (PF) 0.9 % injection (has no administration in time range)  ondansetron (ZOFRAN) injection 4 mg (4 mg Intravenous Given 06/01/19 2317)  famotidine (PEPCID) IVPB 20 mg premix (0 mg Intravenous Stopped 06/02/19 0017)  promethazine (PHENERGAN) injection 25 mg (25 mg Intravenous Given 06/02/19 0122)  iohexol (OMNIPAQUE) 300 MG/ML solution 100 mL (100 mLs Intravenous Contrast Given 06/02/19 0159)  chlordiazePOXIDE (LIBRIUM) capsule 50 mg (50 mg Oral Given 06/02/19 0341)     Initial Impression / Assessment and Plan / ED Course  I  have reviewed the triage vital signs and the nursing notes.  Pertinent labs & imaging results that were available during my care of the patient were reviewed by me and considered in my medical decision making (see chart for details).        33 year old male with a history of homelessness, alcohol abuse perirectal abscesses, and HIV presenting by EMS after he was found to be vomiting in a [facility].  The patient is well-known to this ER with 12 visits in the last 6 months.  He has been drinking alcohol today, but is endorsing abdominal pain, diarrhea, nausea, and vomiting.  He is hemodynamically stable on arrival  to the ER and is afebrile.  Abdominal exam is limited secondary to patient cooperation.  He does have considerable tenderness with palpation to the left lower abdomen.  Will order Zofran and check labs.  Lipase is elevated at 71 as well as AST of 45 and ALT of 46.  Likely secondary to chronic alcohol use.  Lipase is improved from previous.  He has no leukocytosis.  Ethanol level is 226.   On exam, abdominal exam is somewhat limited as patient is guarding the abdomen.  However, he appears to have a significant tenderness to palpation in the left lower quadrant.  He has been endorsing diarrhea and given his history of untreated HIV, I think it is reasonable to check a CT scan of the abdomen pelvis given that he is previously had multiple intra-abdominal complaints.  Although, I strongly suspect that his symptoms are secondary to heavy alcohol use.  CT abdomen pelvis with enteritis with diarrheal state.  Bicarb is normal at 26.  I suspect this is secondary to chronic alcohol use.  The patient was treated with Pepcid and antiemetics in the ER and pain significantly improved.  He successfully tolerated fluids by mouth.  He is feeling much better and is ready for discharge.  He is requesting resources for treatment for alcohol dependence, which have been provided.  He was also given  his first dose of Librium in the ER and will discharge with a course of Librium until he can find treatment placement.  No evidence of alcohol withdrawal prior to discharge.  He is hemodynamically stable and in no acute distress.  Safe for discharge home with outpatient follow-up.  Final Clinical Impressions(s) / ED Diagnoses   Final diagnoses:  Alcoholic intoxication without complication (HCC)  Enteritis    ED Discharge Orders         Ordered    chlordiazePOXIDE (LIBRIUM) 25 MG capsule     06/02/19 0327    loperamide (IMODIUM) 2 MG capsule  4 times daily PRN     06/02/19 0327    promethazine (PHENERGAN) 25 MG tablet  Every 6 hours PRN     06/02/19 0328           McDonald, Pedro EarlsMia A, PA-C 06/02/19 [id number]    Zadie RhineWickline, Donald, MD 06/02/19 [phone]

## 2019-06-01 NOTE — Telephone Encounter (Signed)
Patient failed to provide proof of income.  No additional medication assistance will be provided by [facility] without the required proof of income documentation.  Patient notified by letter.   Samantha Gullett, CPhT [facility] 
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
## 2019-06-02 ENCOUNTER — Encounter (HOSPITAL_COMMUNITY): Payer: Self-pay

## 2019-06-02 ENCOUNTER — Emergency Department (HOSPITAL_COMMUNITY): Payer: Self-pay

## 2019-06-02 IMAGING — CT CT ABDOMEN AND PELVIS WITH CONTRAST
2 of 8 series · 13 of 46 positions shown, 18 images · IV contrast (OMNIPAQUE)
Comparison: CT abdomen pelvis dated [DATE]

CLINICAL DATA: 32-year-old male with nausea vomiting. History of
HIV and alcoholism.

EXAM:
CT ABDOMEN AND PELVIS WITH CONTRAST
TECHNIQUE: Multidetector CT imaging of the abdomen and pelvis was performed
using the standard protocol following bolus administration of
intravenous contrast.
CONTRAST:  100mL OMNIPAQUE IOHEXOL 300 MG/ML  SOLN

[Series 2: axial st · axial · 0.79mm/px · z∈[+1122,+1502]mm · 10 of 90 slices shown, 15 images]
[im 7/90  soft-tissue]
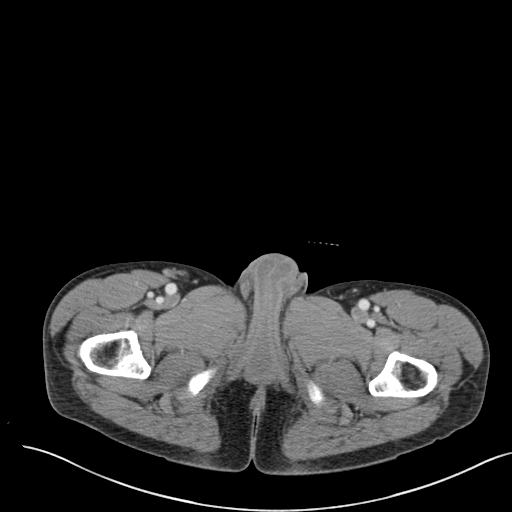
[im 7/90  bone]
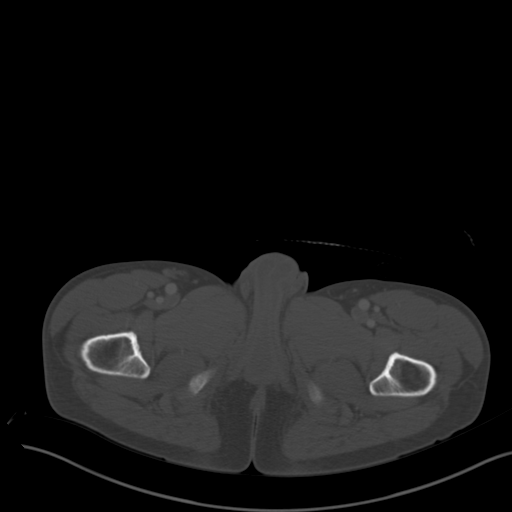
[im 20/90  soft-tissue]
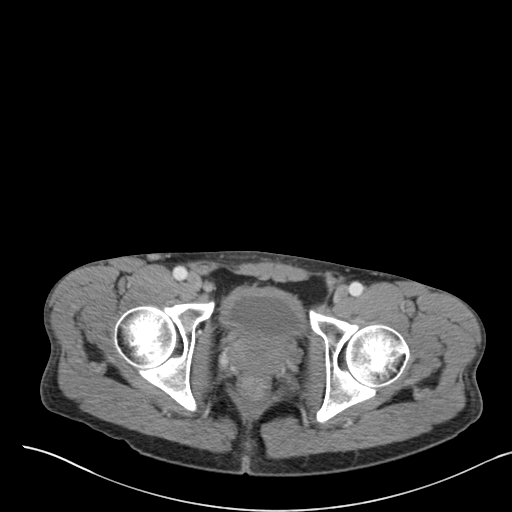
[im 26/90  soft-tissue]
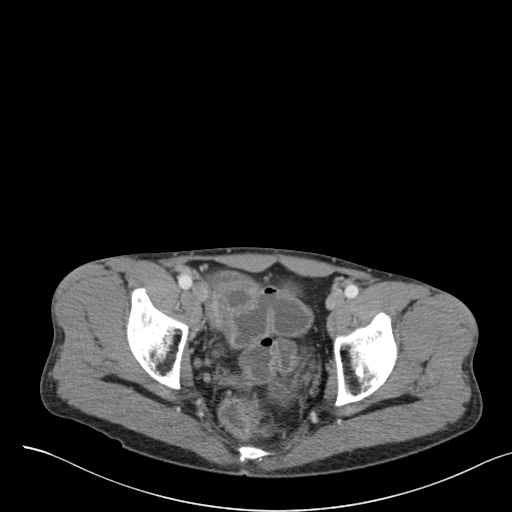
[im 39/90  soft-tissue]
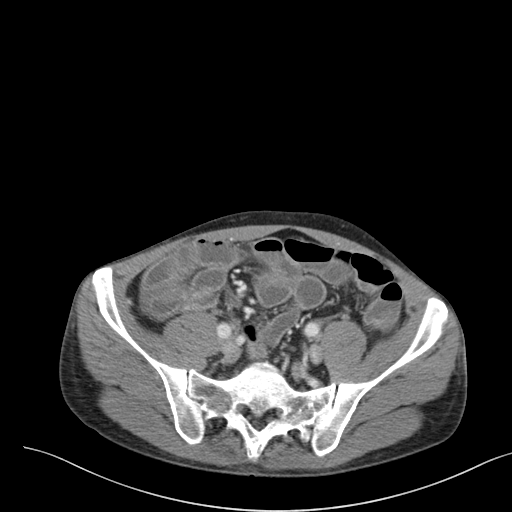
[im 45/90  soft-tissue]
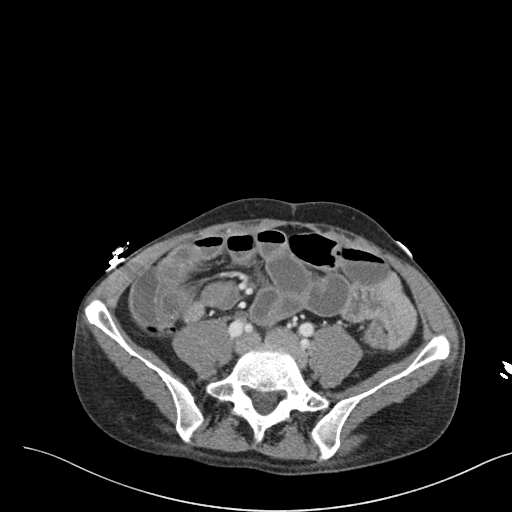
[im 51/90  soft-tissue]
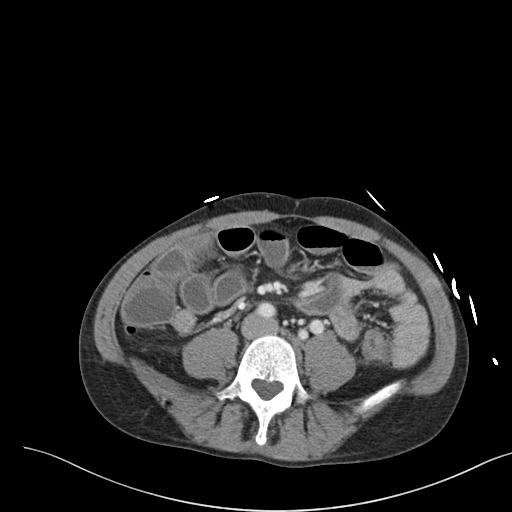
[im 64/90  soft-tissue]
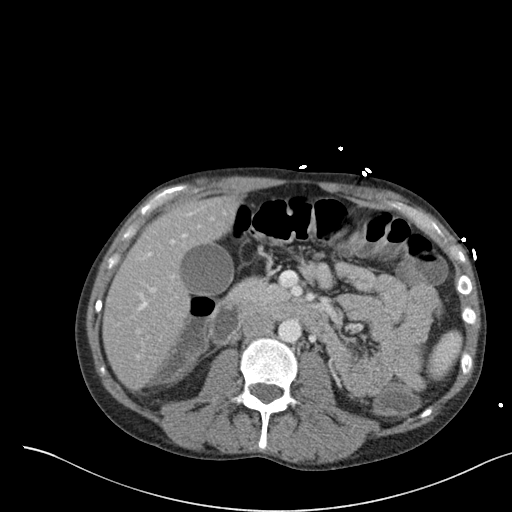
[im 64/90  lung]
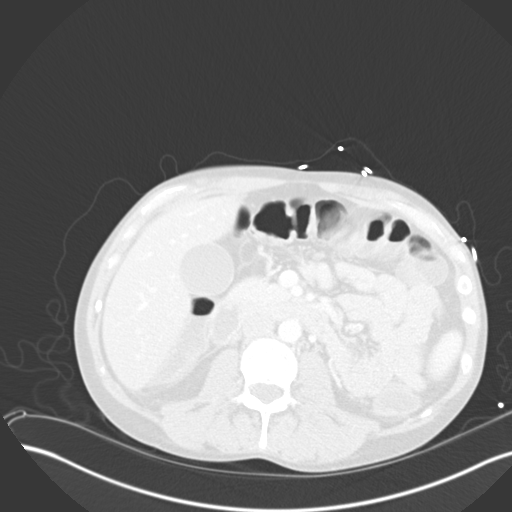
[im 70/90  soft-tissue]
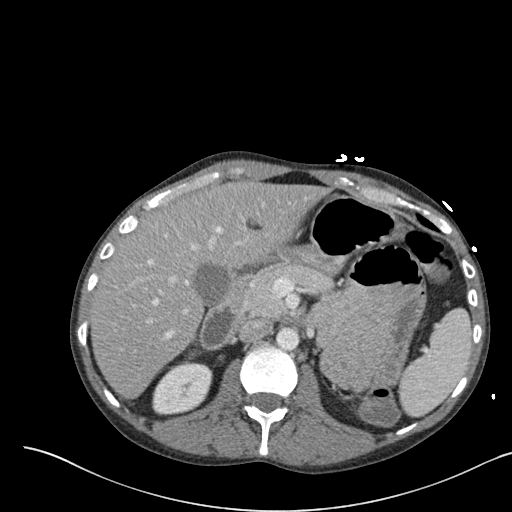
[im 70/90  lung]
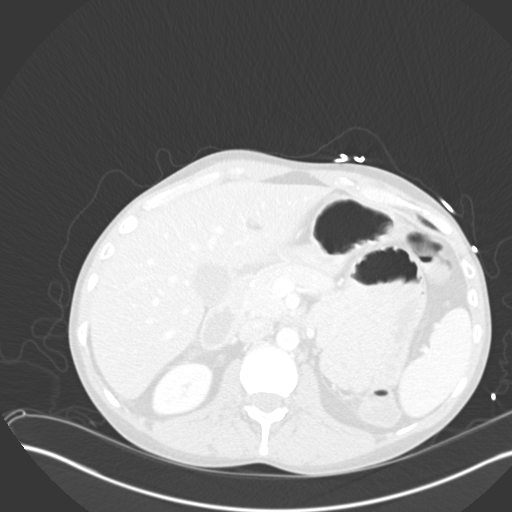
[im 77/90  lung]
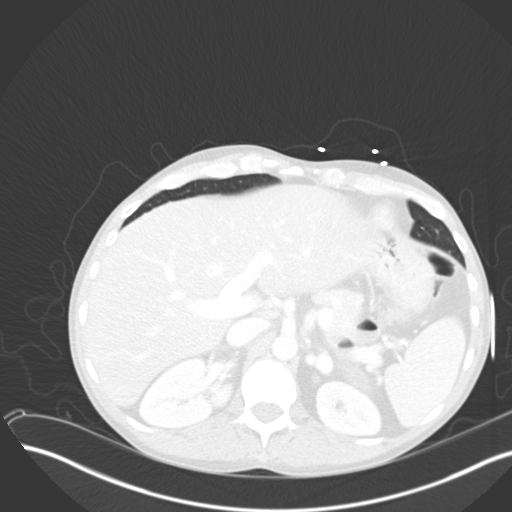
[im 83/90  soft-tissue]
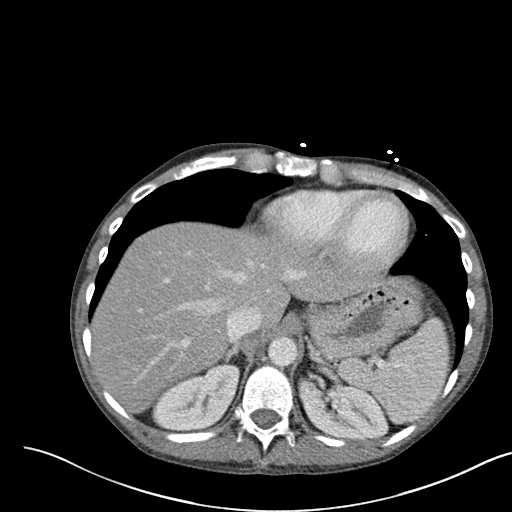
[im 83/90  lung]
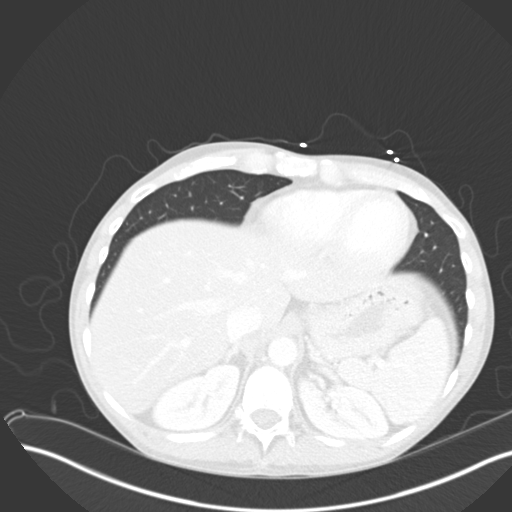
[im 83/90  bone]
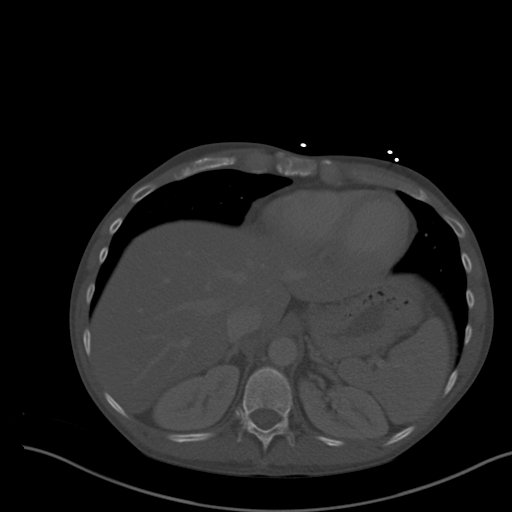

[Series 8: coronal st · coronal · 0.69mm/px · 3 of 131 slices shown]
[im 33/131  soft-tissue]
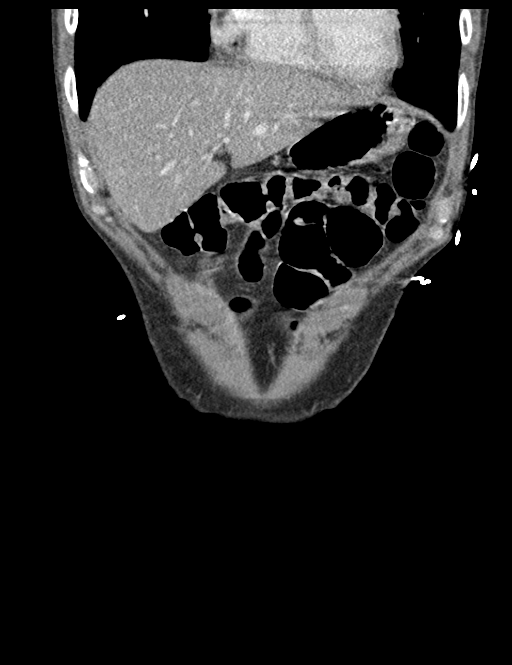
[im 66/131  soft-tissue]
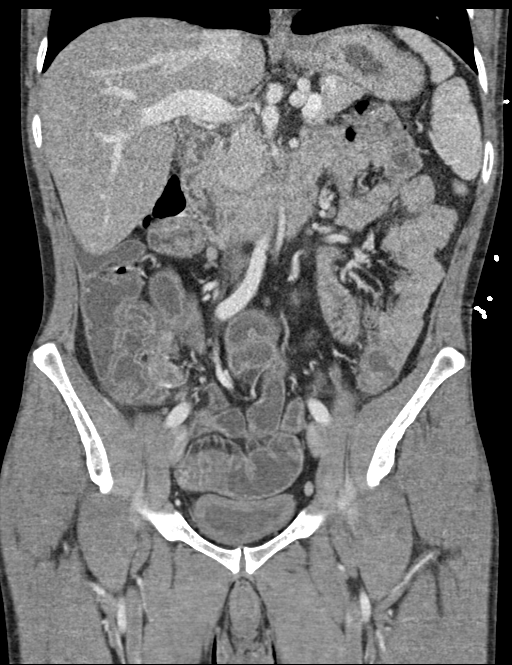
[im 98/131  soft-tissue]
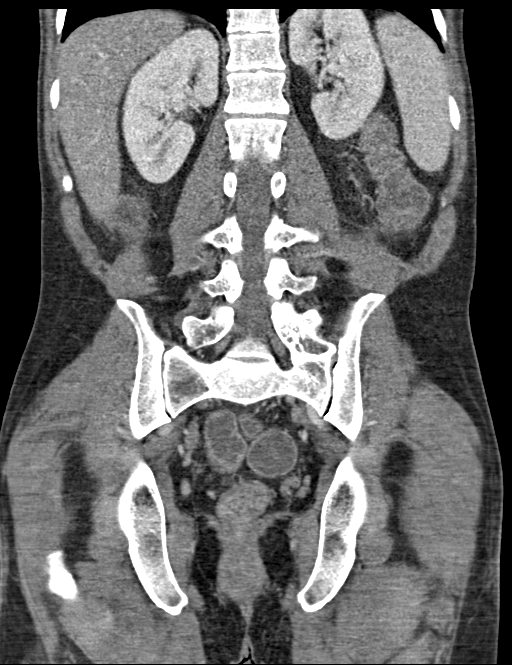

[13 of 46 positions shown; findings below may reference images not displayed]

FINDINGS: Lower chest: The visualized lung bases are clear.

No intra-abdominal free air or free fluid.

Hepatobiliary: Probable mild fatty infiltration of the liver. No
intrahepatic biliary ductal dilatation. The gallbladder is
unremarkable.

Pancreas: Unremarkable. No pancreatic ductal dilatation or
surrounding inflammatory changes.

Spleen: Normal in size without focal abnormality.

Adrenals/Urinary Tract: The adrenal glands, kidneys, and the
visualized ureters appear unremarkable. The urinary bladder is
partially distended. There is apparent diffuse thickening of the
bladder wall which may be partly related to underdistention.
Cystitis is not excluded. Correlation with urinalysis recommended.

Stomach/Bowel: Multiple thickened and inflamed loops of small bowel
in the mid to lower abdomen with fluid content most consistent with
enteritis. There is no bowel obstruction. Loose stool within the
colon compatible with diarrheal state. The appendix is normal.

Vascular/Lymphatic: The abdominal aorta and IVC are unremarkable. No
portal venous gas. There is no adenopathy.

Reproductive: The prostate and seminal vesicles are grossly
unremarkable. No pelvic mass.

Other: No abdominal wall hernia or abnormality. No abdominopelvic
ascites.

Musculoskeletal: No acute or significant osseous findings.
IMPRESSION: Enteritis with diarrheal state. Correlation with clinical exam and
stool cultures recommended. No bowel obstruction. Normal appendix.

## 2019-06-02 MED ORDER — LOPERAMIDE HCL 2 MG PO CAPS: 2.0000 mg | ORAL_CAPSULE | Freq: Four times a day (QID) | ORAL | 0 refills | Status: DC | PRN

## 2019-06-02 MED ORDER — PROMETHAZINE HCL 25 MG/ML IJ SOLN
25.0000 mg | Freq: Once | INTRAMUSCULAR | Status: AC
  Administered 2019-06-02: 25 mg via INTRAVENOUS
  Filled 2019-06-02: qty 1

## 2019-06-02 MED ORDER — SODIUM CHLORIDE (PF) 0.9 % IJ SOLN
INTRAMUSCULAR | Status: AC
  Filled 2019-06-02: qty 50

## 2019-06-02 MED ORDER — CHLORDIAZEPOXIDE HCL 25 MG PO CAPS
50.0000 mg | ORAL_CAPSULE | Freq: Once | ORAL | Status: AC
  Administered 2019-06-02: 50 mg via ORAL
  Filled 2019-06-02: qty 2

## 2019-06-02 MED ORDER — CHLORDIAZEPOXIDE HCL 25 MG PO CAPS: ORAL_CAPSULE | ORAL | 0 refills | Status: DC

## 2019-06-02 MED ORDER — IOHEXOL 300 MG/ML  SOLN
100.0000 mL | Freq: Once | INTRAMUSCULAR | Status: AC | PRN
  Administered 2019-06-02: 100 mL via INTRAVENOUS

## 2019-06-02 MED ORDER — PROMETHAZINE HCL 25 MG PO TABS: 25.0000 mg | ORAL_TABLET | Freq: Four times a day (QID) | ORAL | 0 refills | Status: DC | PRN

## 2019-06-02 NOTE — Discharge Instructions (Addendum)
Thank you for allowing me to care for you today in the Emergency Department.   Use the attached resources to find a facility if you would like to stop drinking alcohol.  Take Librium as prescribed.  Your first dose has been given in the ER.  You can use Imodium for diarrhea and phenergan for vomiting.   Return to the emergency department if you develop bloody diarrhea, severe abdominal pain, high fevers, if you have a seizure if you stop drinking alcohol, persistent vomiting, or other new, concerning symptoms.

## 2019-07-01 ENCOUNTER — Observation Stay (HOSPITAL_COMMUNITY)
Admission: EM | Admit: 2019-07-01 | Discharge: 2019-07-03 | Disposition: A | Discharge status: Home / Self Care | Payer: Self-pay | Attending: Family Medicine | Admitting: Family Medicine

## 2019-07-01 ENCOUNTER — Encounter (HOSPITAL_COMMUNITY): Payer: Self-pay | Admitting: Pharmacy Technician

## 2019-07-01 ENCOUNTER — Other Ambulatory Visit: Payer: Self-pay

## 2019-07-01 ENCOUNTER — Emergency Department (HOSPITAL_COMMUNITY): Payer: Self-pay

## 2019-07-01 DIAGNOSIS — M542 Cervicalgia: Secondary | ICD-10-CM | POA: Insufficient documentation

## 2019-07-01 DIAGNOSIS — Z811 Family history of alcohol abuse and dependence: Secondary | ICD-10-CM | POA: Insufficient documentation

## 2019-07-01 DIAGNOSIS — R569 Unspecified convulsions: Secondary | ICD-10-CM | POA: Insufficient documentation

## 2019-07-01 DIAGNOSIS — F10930 Alcohol use, unspecified with withdrawal, uncomplicated: Secondary | ICD-10-CM

## 2019-07-01 DIAGNOSIS — S0101XA Laceration without foreign body of scalp, initial encounter: Secondary | ICD-10-CM | POA: Insufficient documentation

## 2019-07-01 DIAGNOSIS — F10230 Alcohol dependence with withdrawal, uncomplicated: Secondary | ICD-10-CM

## 2019-07-01 DIAGNOSIS — S065X9A Traumatic subdural hemorrhage with loss of consciousness of unspecified duration, initial encounter: Principal | ICD-10-CM | POA: Insufficient documentation

## 2019-07-01 DIAGNOSIS — F10921 Alcohol use, unspecified with intoxication delirium: Secondary | ICD-10-CM | POA: Diagnosis present

## 2019-07-01 DIAGNOSIS — Z20828 Contact with and (suspected) exposure to other viral communicable diseases: Secondary | ICD-10-CM | POA: Insufficient documentation

## 2019-07-01 DIAGNOSIS — F10229 Alcohol dependence with intoxication, unspecified: Secondary | ICD-10-CM | POA: Insufficient documentation

## 2019-07-01 DIAGNOSIS — S065XAA Traumatic subdural hemorrhage with loss of consciousness status unknown, initial encounter: Secondary | ICD-10-CM | POA: Diagnosis present

## 2019-07-01 DIAGNOSIS — R2681 Unsteadiness on feet: Secondary | ICD-10-CM | POA: Insufficient documentation

## 2019-07-01 DIAGNOSIS — Y908 Blood alcohol level of 240 mg/100 ml or more: Secondary | ICD-10-CM | POA: Insufficient documentation

## 2019-07-01 DIAGNOSIS — F10929 Alcohol use, unspecified with intoxication, unspecified: Secondary | ICD-10-CM | POA: Diagnosis present

## 2019-07-01 DIAGNOSIS — F10129 Alcohol abuse with intoxication, unspecified: Secondary | ICD-10-CM | POA: Diagnosis present

## 2019-07-01 DIAGNOSIS — F332 Major depressive disorder, recurrent severe without psychotic features: Secondary | ICD-10-CM | POA: Insufficient documentation

## 2019-07-01 DIAGNOSIS — B20 Human immunodeficiency virus [HIV] disease: Secondary | ICD-10-CM | POA: Diagnosis present

## 2019-07-01 DIAGNOSIS — F17210 Nicotine dependence, cigarettes, uncomplicated: Secondary | ICD-10-CM | POA: Insufficient documentation

## 2019-07-01 DIAGNOSIS — F10920 Alcohol use, unspecified with intoxication, uncomplicated: Secondary | ICD-10-CM | POA: Diagnosis present

## 2019-07-01 DIAGNOSIS — R269 Unspecified abnormalities of gait and mobility: Secondary | ICD-10-CM | POA: Insufficient documentation

## 2019-07-01 DIAGNOSIS — M6281 Muscle weakness (generalized): Secondary | ICD-10-CM | POA: Insufficient documentation

## 2019-07-01 DIAGNOSIS — R251 Tremor, unspecified: Secondary | ICD-10-CM | POA: Insufficient documentation

## 2019-07-01 DIAGNOSIS — Z21 Asymptomatic human immunodeficiency virus [HIV] infection status: Secondary | ICD-10-CM | POA: Diagnosis present

## 2019-07-01 LAB — CBC WITH DIFFERENTIAL/PLATELET
Abs Immature Granulocytes: 0.01 10*3/uL (ref 0.00–0.07)
Basophils Absolute: 0 10*3/uL (ref 0.0–0.1)
Basophils Relative: 1 %
Eosinophils Absolute: 0.1 10*3/uL (ref 0.0–0.5)
Eosinophils Relative: 3 %
HCT: 39 % (ref 39.0–52.0)
Hemoglobin: 13.2 g/dL (ref 13.0–17.0)
Immature Granulocytes: 0 %
Lymphocytes Relative: 31 %
Lymphs Abs: 1.2 10*3/uL (ref 0.7–4.0)
MCH: 32.8 pg (ref 26.0–34.0)
MCHC: 33.8 g/dL (ref 30.0–36.0)
MCV: 96.8 fL (ref 80.0–100.0)
Monocytes Absolute: 0.3 10*3/uL (ref 0.1–1.0)
Monocytes Relative: 8 %
Neutro Abs: 2.1 10*3/uL (ref 1.7–7.7)
Neutrophils Relative %: 57 %
Platelets: 121 10*3/uL — ABNORMAL LOW (ref 150–400)
RBC: 4.03 MIL/uL — ABNORMAL LOW (ref 4.22–5.81)
RDW: 14.2 % (ref 11.5–15.5)
WBC: 3.7 10*3/uL — ABNORMAL LOW (ref 4.0–10.5)
nRBC: 0 % (ref 0.0–0.2)

## 2019-07-01 LAB — BASIC METABOLIC PANEL
Anion gap: 10 (ref 5–15)
BUN: <5 mg/dL — ABNORMAL LOW (ref 6–20)
CO2: 28 mmol/L (ref 22–32)
Calcium: 8.2 mg/dL — ABNORMAL LOW (ref 8.9–10.3)
Chloride: 103 mmol/L (ref 98–111)
Creatinine, Ser: 0.61 mg/dL (ref 0.61–1.24)
GFR calc Af Amer: >60 mL/min (ref >60)
GFR calc non Af Amer: >60 mL/min (ref >60)
Glucose, Bld: 104 mg/dL — ABNORMAL HIGH (ref 70–99)
Potassium: 3.6 mmol/L (ref 3.5–5.1)
Sodium: 141 mmol/L (ref 135–145)

## 2019-07-01 LAB — ETHANOL: Alcohol, Ethyl (B): 279 mg/dL — ABNORMAL HIGH (ref <10)
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
## 2019-07-01 MED ORDER — LORAZEPAM 2 MG/ML IJ SOLN
0.0000 mg | Freq: Four times a day (QID) | INTRAMUSCULAR | Status: DC
  Administered 2019-07-01: 2 mg via INTRAVENOUS
  Administered 2019-07-01: 1 mg via INTRAVENOUS
  Administered 2019-07-02: 2 mg via INTRAVENOUS
  Administered 2019-07-02 – 2019-07-03 (×2): 4 mg via INTRAVENOUS
  Filled 2019-07-01 (×3): qty 1
  Filled 2019-07-01 (×2): qty 2

## 2019-07-01 MED ORDER — THIAMINE HCL 100 MG/ML IJ SOLN
100.0000 mg | Freq: Every day | INTRAMUSCULAR | Status: DC
  Administered 2019-07-01: 100 mg via INTRAVENOUS
  Filled 2019-07-01 (×2): qty 2

## 2019-07-01 MED ORDER — LORAZEPAM 1 MG PO TABS
0.0000 mg | ORAL_TABLET | Freq: Four times a day (QID) | ORAL | Status: DC
  Administered 2019-07-02: 1 mg via ORAL
  Filled 2019-07-01: qty 1

## 2019-07-01 MED ORDER — VITAMIN B-1 100 MG PO TABS
100.0000 mg | ORAL_TABLET | Freq: Every day | ORAL | Status: DC
  Administered 2019-07-02 – 2019-07-03 (×2): 100 mg via ORAL
  Filled 2019-07-01 (×2): qty 1

## 2019-07-01 MED ORDER — LORAZEPAM 1 MG PO TABS: 0.0000 mg | ORAL_TABLET | Freq: Two times a day (BID) | ORAL | Status: DC

## 2019-07-01 MED ORDER — LORAZEPAM 2 MG/ML IJ SOLN: 0.0000 mg | Freq: Two times a day (BID) | INTRAMUSCULAR | Status: DC

## 2019-07-01 MED ORDER — MORPHINE SULFATE (PF) 4 MG/ML IV SOLN
4.0000 mg | Freq: Once | INTRAVENOUS | Status: AC
  Administered 2019-07-01: 4 mg via INTRAVENOUS
  Filled 2019-07-01: qty 1
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
## 2019-07-02 ENCOUNTER — Emergency Department (HOSPITAL_COMMUNITY): Payer: Self-pay

## 2019-07-02 DIAGNOSIS — F10920 Alcohol use, unspecified with intoxication, uncomplicated: Secondary | ICD-10-CM | POA: Diagnosis present

## 2019-07-02 DIAGNOSIS — S065XAA Traumatic subdural hemorrhage with loss of consciousness status unknown, initial encounter: Secondary | ICD-10-CM | POA: Diagnosis present

## 2019-07-02 DIAGNOSIS — F10929 Alcohol use, unspecified with intoxication, unspecified: Secondary | ICD-10-CM | POA: Diagnosis present

## 2019-07-02 DIAGNOSIS — F10921 Alcohol use, unspecified with intoxication delirium: Secondary | ICD-10-CM | POA: Diagnosis present

## 2019-07-02 DIAGNOSIS — F10229 Alcohol dependence with intoxication, unspecified: Secondary | ICD-10-CM | POA: Diagnosis present

## 2019-07-02 DIAGNOSIS — B20 Human immunodeficiency virus [HIV] disease: Secondary | ICD-10-CM

## 2019-07-02 DIAGNOSIS — S065X9A Traumatic subdural hemorrhage with loss of consciousness of unspecified duration, initial encounter: Secondary | ICD-10-CM

## 2019-07-02 DIAGNOSIS — F17210 Nicotine dependence, cigarettes, uncomplicated: Secondary | ICD-10-CM

## 2019-07-02 DIAGNOSIS — F10129 Alcohol abuse with intoxication, unspecified: Secondary | ICD-10-CM | POA: Diagnosis present

## 2019-07-02 LAB — MRSA PCR SCREENING

## 2019-07-02 LAB — COMPREHENSIVE METABOLIC PANEL
ALT: 93 U/L — ABNORMAL HIGH (ref 0–44)
AST: 85 U/L — ABNORMAL HIGH (ref 15–41)
Albumin: 3.7 g/dL (ref 3.5–5.0)
Alkaline Phosphatase: 62 U/L (ref 38–126)
Anion gap: 12 (ref 5–15)
BUN: <5 mg/dL — ABNORMAL LOW (ref 6–20)
CO2: 26 mmol/L (ref 22–32)
Calcium: 8.8 mg/dL — ABNORMAL LOW (ref 8.9–10.3)
Chloride: 99 mmol/L (ref 98–111)
Creatinine, Ser: 0.69 mg/dL (ref 0.61–1.24)
GFR calc Af Amer: >60 mL/min (ref >60)
GFR calc non Af Amer: >60 mL/min (ref >60)
Glucose, Bld: 96 mg/dL (ref 70–99)
Potassium: 3.7 mmol/L (ref 3.5–5.1)
Sodium: 137 mmol/L (ref 135–145)
Total Bilirubin: 1.2 mg/dL (ref 0.3–1.2)
Total Protein: 6.6 g/dL (ref 6.5–8.1)

## 2019-07-02 LAB — CBC
HCT: 38.1 % — ABNORMAL LOW (ref 39.0–52.0)
Hemoglobin: 12.9 g/dL — ABNORMAL LOW (ref 13.0–17.0)
MCH: 33.2 pg (ref 26.0–34.0)
MCHC: 33.9 g/dL (ref 30.0–36.0)
MCV: 97.9 fL (ref 80.0–100.0)
Platelets: 112 10*3/uL — ABNORMAL LOW (ref 150–400)
RBC: 3.89 MIL/uL — ABNORMAL LOW (ref 4.22–5.81)
RDW: 14.1 % (ref 11.5–15.5)
WBC: 4.1 10*3/uL (ref 4.0–10.5)
nRBC: 0 % (ref 0.0–0.2)

## 2019-07-02 LAB — SARS CORONAVIRUS 2 (TAT 6-24 HRS): SARS Coronavirus 2: NEGATIVE

## 2019-07-02 IMAGING — CT CT HEAD WITHOUT CONTRAST
3 of 4 series · 13 of 47 positions shown, 15 images · non-contrast
Comparison: Prior CT from [DATE].

CLINICAL DATA: Follow-up examination for known subdural hemorrhage.

EXAM:
CT HEAD WITHOUT CONTRAST
TECHNIQUE: Contiguous axial images were obtained from the base of the skull
through the vertex without intravenous contrast.

[Series 3: head without · axial · non-contrast · 0.49mm/px · z∈[-151,-11]mm · 7 of 38 slices shown, 9 images]
[im 5/38  brain]
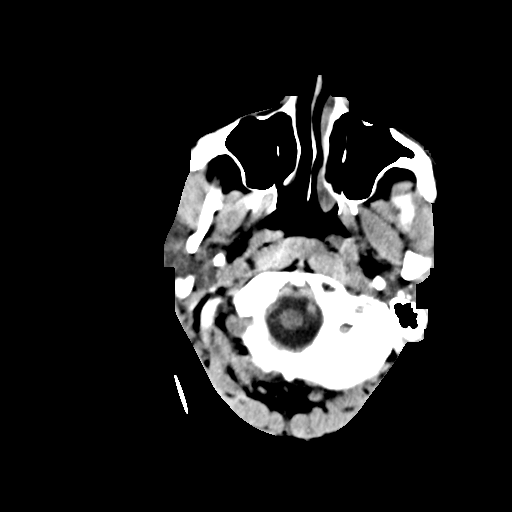
[im 5/38  bone]
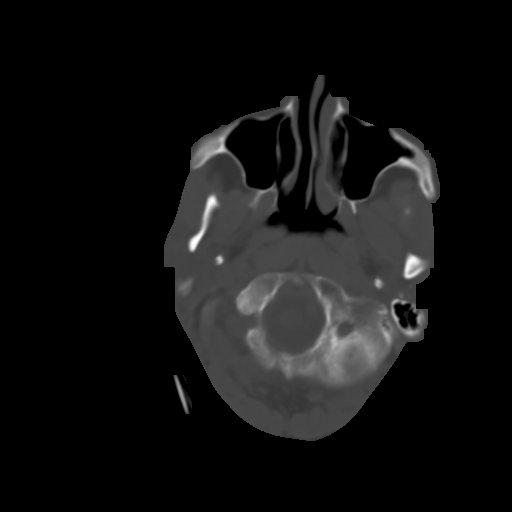
[im 10/38  brain]
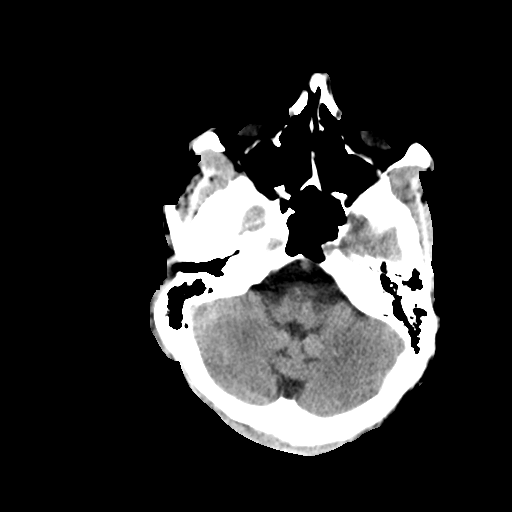
[im 14/38  brain]
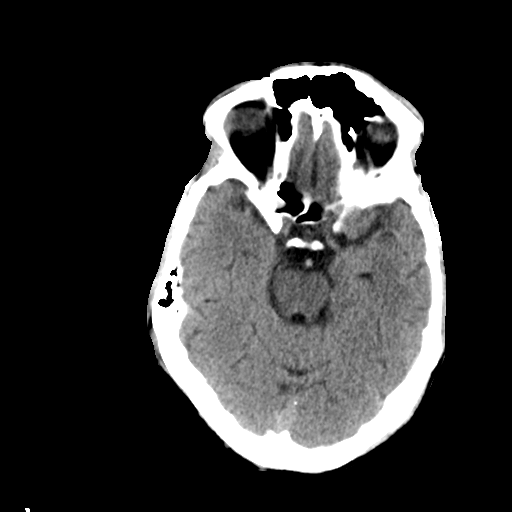
[im 19/38  brain]
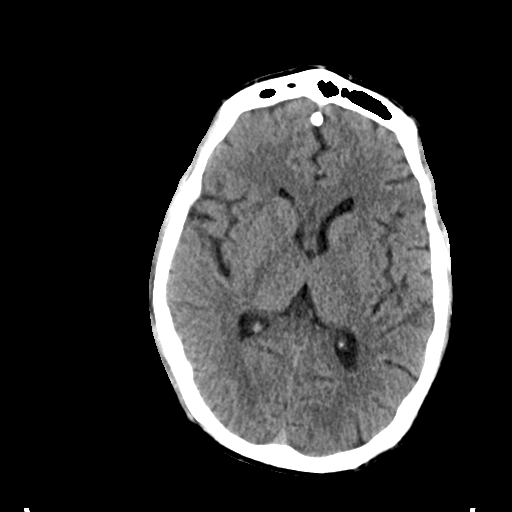
[im 24/38  brain]
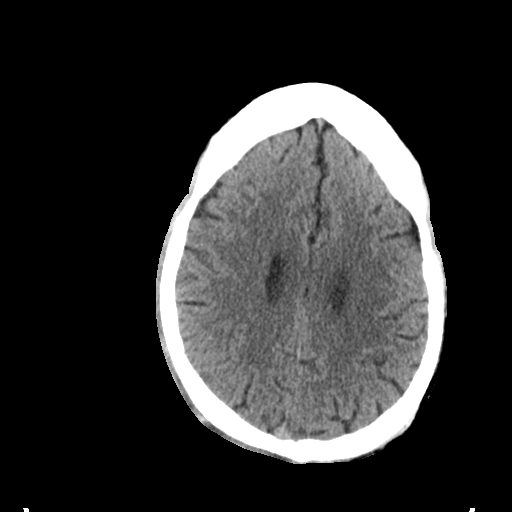
[im 24/38  bone]
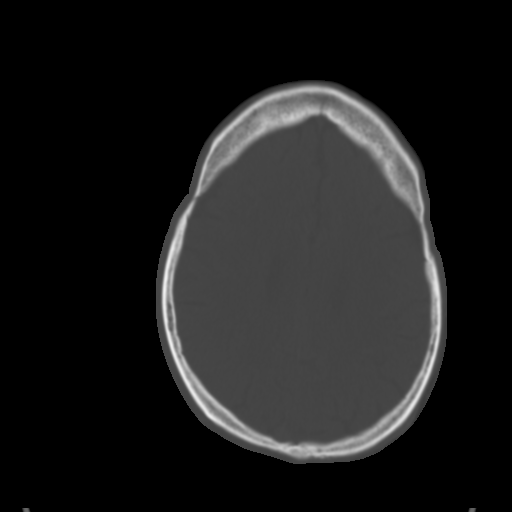
[im 28/38  brain]
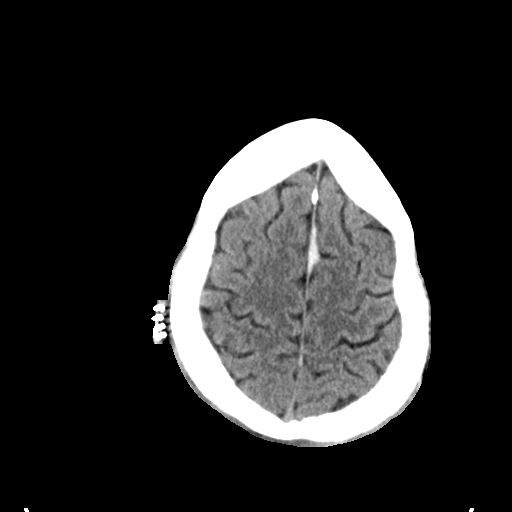
[im 33/38  brain]
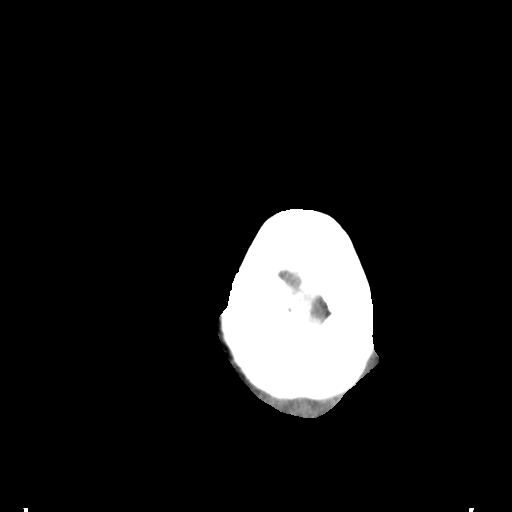

[Series 5: head without cor · coronal · non-contrast · 0.37mm/px · 3 of 84 slices shown]
[im 28/84  brain]
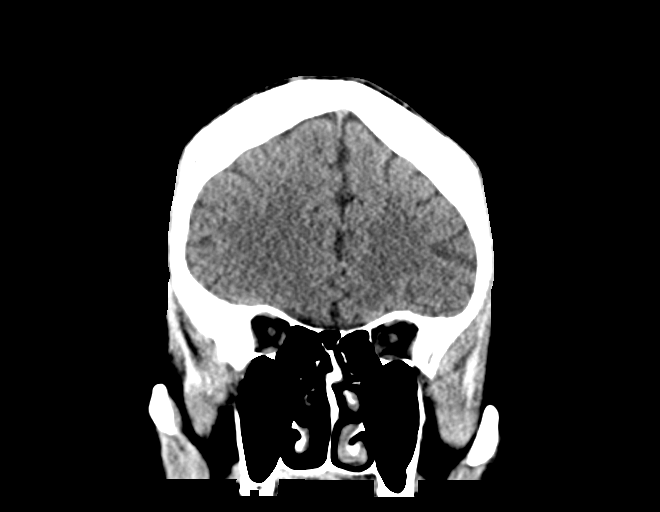
[im 37/84  brain]
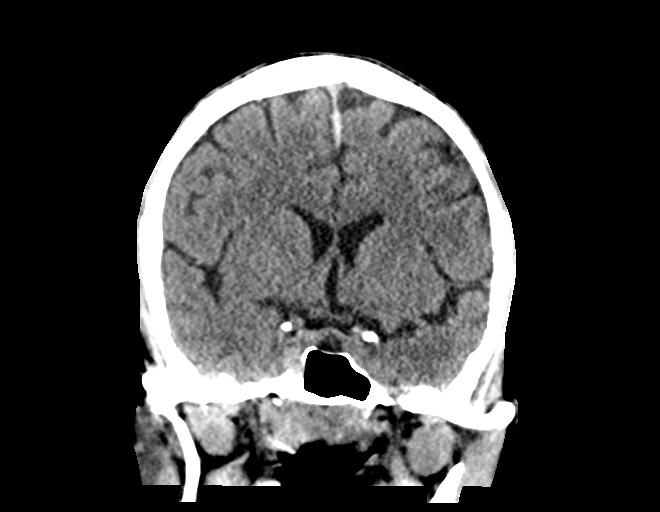
[im 47/84  brain]
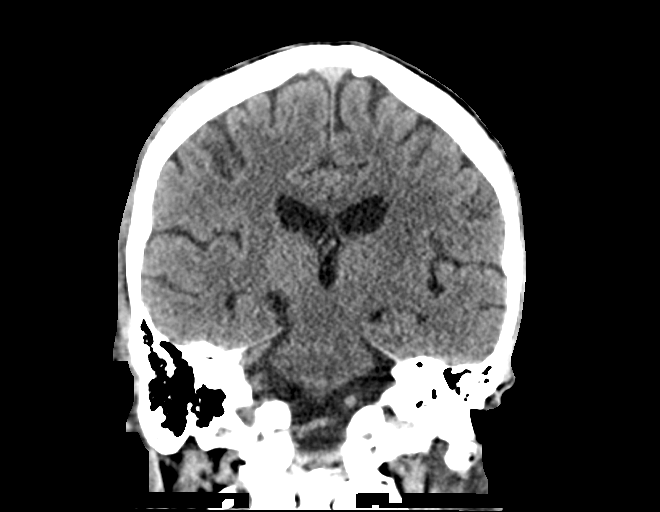

[Series 6: head without sag · sagittal · non-contrast · 0.37mm/px · 3 of 67 slices shown]
[im 23/67  brain]
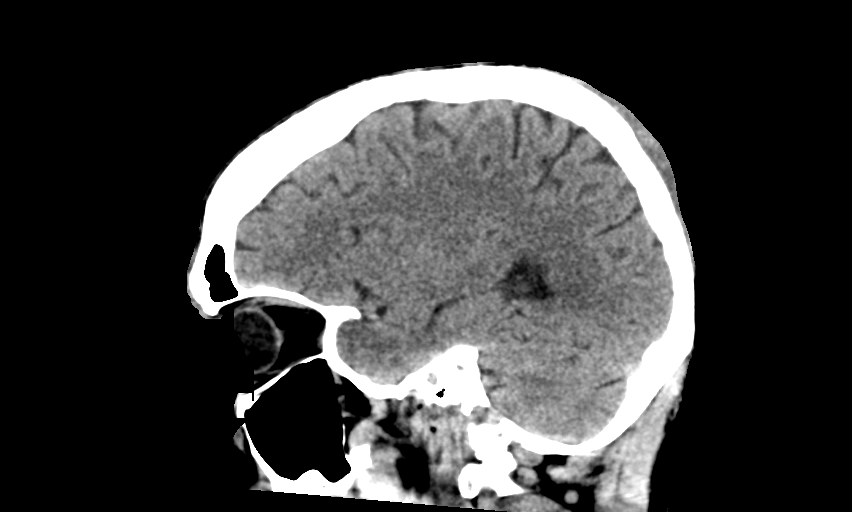
[im 34/67  brain]
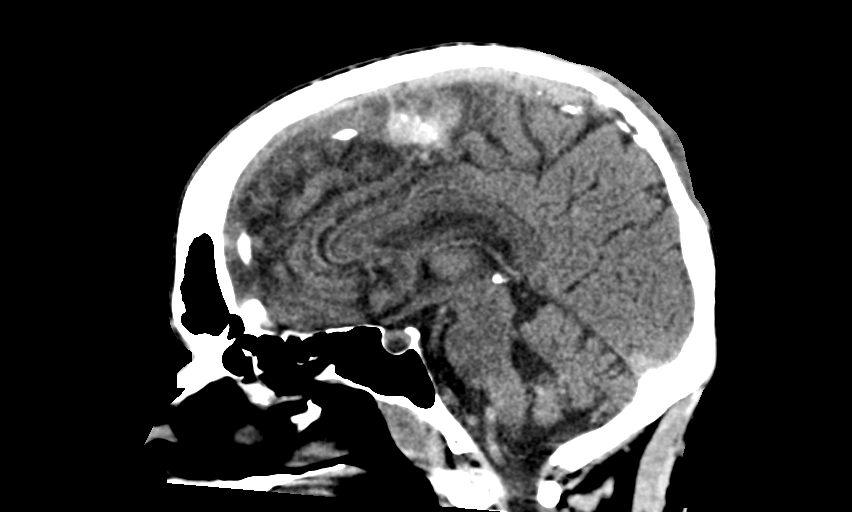
[im 45/67  brain]
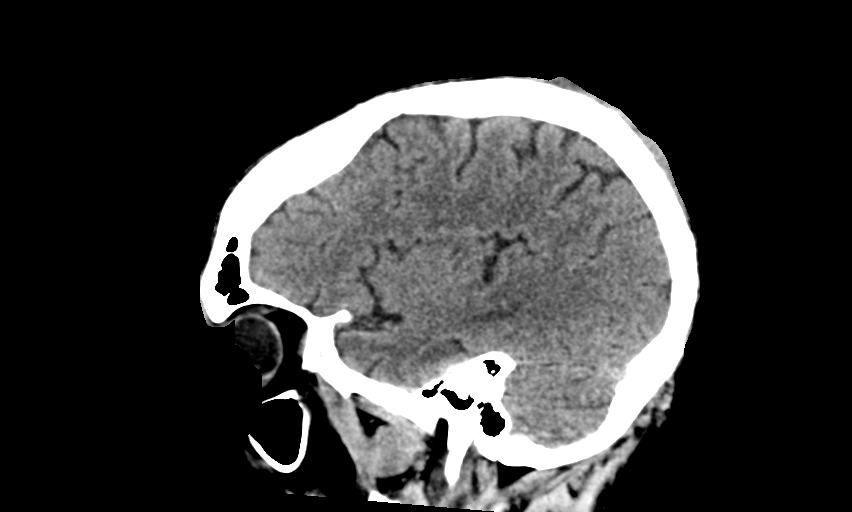

[13 of 47 positions shown; findings below may reference images not displayed]

FINDINGS: Brain: Previously identified small left parafalcine subdural
hemorrhage again seen, little interval changed measuring up to 5 mm
in maximal thickness. No significant mass effect or interval
increase in size. No other new acute intracranial hemorrhage. No
acute large vessel territory infarct. No mass effect or midline
shift. No hydrocephalus. No appreciable mass lesion. Faint
hyperdensity noted within the left periatrial white matter,
nonspecific and age indeterminate, but could reflect an additional
tiny focus of hemorrhage (series 3, image 18).

Vascular: No hyperdense vessel.

Skull: Evolving posterior scalp contusion. Skin staples in place at
the right parietal scalp. Calvarium intact.

Sinuses/Orbits: Globes and orbital soft tissues within normal
limits. Paranasal sinuses remain clear. No mastoid effusion.

Other: None.
IMPRESSION: 1. No significant interval change in small left parafalcine subdural
hemorrhage measuring up to 5 mm in maximal thickness. No significant
mass effect.
2. Additional punctate hyperdensity at the left periatrial white
matter, nonspecific, but could reflect an additional tiny focus of
hemorrhage.
3. No other new acute intracranial abnormality.
4. Evolving left posterior scalp contusion, with skin staples in
place at the right parietal scalp.

## 2019-07-02 MED ORDER — ACETAMINOPHEN 325 MG PO TABS
650.0000 mg | ORAL_TABLET | Freq: Four times a day (QID) | ORAL | Status: DC | PRN
  Administered 2019-07-02 – 2019-07-03 (×3): 650 mg via ORAL
  Filled 2019-07-02 (×3): qty 2

## 2019-07-02 MED ORDER — LORAZEPAM 2 MG/ML IJ SOLN
2.0000 mg | Freq: Once | INTRAMUSCULAR | Status: AC
  Administered 2019-07-02: 2 mg via INTRAVENOUS
  Filled 2019-07-02: qty 1

## 2019-07-02 MED ORDER — LORAZEPAM 2 MG/ML IJ SOLN
1.0000 mg | Freq: Once | INTRAMUSCULAR | Status: AC
  Administered 2019-07-02: 1 mg via INTRAVENOUS
  Filled 2019-07-02: qty 1

## 2019-07-02 MED ORDER — ACETAMINOPHEN 500 MG PO TABS
1000.0000 mg | ORAL_TABLET | Freq: Once | ORAL | Status: AC
  Administered 2019-07-02: 1000 mg via ORAL
  Filled 2019-07-02: qty 2

## 2019-07-02 MED ORDER — FOLIC ACID 1 MG PO TABS
1.0000 mg | ORAL_TABLET | Freq: Every day | ORAL | Status: DC
  Administered 2019-07-02 – 2019-07-03 (×2): 1 mg via ORAL
  Filled 2019-07-02 (×2): qty 1

## 2019-07-02 MED ORDER — ADULT MULTIVITAMIN W/MINERALS CH
1.0000 | ORAL_TABLET | Freq: Every day | ORAL | Status: DC
  Administered 2019-07-02 – 2019-07-03 (×2): 1 via ORAL
  Filled 2019-07-02: qty 1

## 2019-07-02 MED ORDER — ACETAMINOPHEN 650 MG RE SUPP: 650.0000 mg | Freq: Four times a day (QID) | RECTAL | Status: DC | PRN

## 2019-07-02 MED ORDER — ALBUTEROL SULFATE (2.5 MG/3ML) 0.083% IN NEBU: 2.5000 mg | INHALATION_SOLUTION | Freq: Four times a day (QID) | RESPIRATORY_TRACT | Status: DC | PRN

## 2019-07-02 MED ORDER — ONDANSETRON HCL 4 MG/2ML IJ SOLN: 4.0000 mg | Freq: Four times a day (QID) | INTRAMUSCULAR | Status: DC | PRN

## 2019-07-02 MED ORDER — CALCIUM GLUCONATE-NACL 1-0.675 GM/50ML-% IV SOLN
1.0000 g | Freq: Once | INTRAVENOUS | Status: AC
  Administered 2019-07-02: 1000 mg via INTRAVENOUS
  Filled 2019-07-02: qty 50

## 2019-07-02 MED ORDER — ONDANSETRON HCL 4 MG PO TABS: 4.0000 mg | ORAL_TABLET | Freq: Four times a day (QID) | ORAL | Status: DC | PRN

## 2019-07-02 MED ORDER — SODIUM CHLORIDE 0.9% FLUSH
3.0000 mL | Freq: Two times a day (BID) | INTRAVENOUS | Status: DC
  Administered 2019-07-02 – 2019-07-03 (×3): 3 mL via INTRAVENOUS

## 2019-07-02 NOTE — ED Notes (Signed)
Report given to [address] nurse.
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
## 2019-07-02 NOTE — ED Notes (Signed)
Patient transported to CT 

## 2019-07-02 NOTE — ED Notes (Signed)
Ambulated pt from room 26 to room 39. Pt extremely unsteady, had to stop multiple times due to dizziness and tremors.  Pt states he drinks at least a 12 pack a day.  Pt reports last drink was at 1000 07/01/2019.

## 2019-07-02 NOTE — ED Notes (Signed)
Pt was dizzy and unsteady on his feet while ambulating. Pt stated that he felt awful.

## 2019-07-02 NOTE — ED Notes (Signed)
Pt ambulated in hall with assistance from NT. Pt states feeling dizzy. PA [employer] aware. CT head ordered

## 2019-07-02 NOTE — ED Notes (Signed)
Attempted to call report at this time, nurse unavailable at this time. Will call back in 10 minutes.

## 2019-07-03 LAB — BASIC METABOLIC PANEL
Anion gap: 11 (ref 5–15)
BUN: 6 mg/dL (ref 6–20)
CO2: 23 mmol/L (ref 22–32)
Calcium: 9 mg/dL (ref 8.9–10.3)
Chloride: 100 mmol/L (ref 98–111)
Creatinine, Ser: 0.62 mg/dL (ref 0.61–1.24)
GFR calc Af Amer: >60 mL/min (ref >60)
GFR calc non Af Amer: >60 mL/min (ref >60)
Glucose, Bld: 103 mg/dL — ABNORMAL HIGH (ref 70–99)
Potassium: 3.5 mmol/L (ref 3.5–5.1)
Sodium: 134 mmol/L — ABNORMAL LOW (ref 135–145)

## 2019-07-03 LAB — CBC WITH DIFFERENTIAL/PLATELET
Abs Immature Granulocytes: 0.01 10*3/uL (ref 0.00–0.07)
Basophils Absolute: 0 10*3/uL (ref 0.0–0.1)
Basophils Relative: 0 %
Eosinophils Absolute: 0.1 10*3/uL (ref 0.0–0.5)
Eosinophils Relative: 4 %
HCT: 36 % — ABNORMAL LOW (ref 39.0–52.0)
Hemoglobin: 12.8 g/dL — ABNORMAL LOW (ref 13.0–17.0)
Immature Granulocytes: 0 %
Lymphocytes Relative: 23 %
Lymphs Abs: 0.6 10*3/uL — ABNORMAL LOW (ref 0.7–4.0)
MCH: 33.9 pg (ref 26.0–34.0)
MCHC: 35.6 g/dL (ref 30.0–36.0)
MCV: 95.2 fL (ref 80.0–100.0)
Monocytes Absolute: 0.3 10*3/uL (ref 0.1–1.0)
Monocytes Relative: 12 %
Neutro Abs: 1.7 10*3/uL (ref 1.7–7.7)
Neutrophils Relative %: 61 %
Platelets: 106 10*3/uL — ABNORMAL LOW (ref 150–400)
RBC: 3.78 MIL/uL — ABNORMAL LOW (ref 4.22–5.81)
RDW: 13.7 % (ref 11.5–15.5)
WBC: 2.8 10*3/uL — ABNORMAL LOW (ref 4.0–10.5)
nRBC: 0 % (ref 0.0–0.2)

## 2019-07-03 MED ORDER — LORAZEPAM 2 MG/ML IJ SOLN: 0.0000 mg | Freq: Four times a day (QID) | INTRAMUSCULAR | Status: DC

## 2019-07-03 MED ORDER — ADULT MULTIVITAMIN W/MINERALS CH
1.0000 | ORAL_TABLET | Freq: Every day | ORAL | Status: DC
  Filled 2019-07-03: qty 1

## 2019-07-03 MED ORDER — LORAZEPAM 2 MG/ML IJ SOLN: 1.0000 mg | Freq: Four times a day (QID) | INTRAMUSCULAR | Status: DC | PRN

## 2019-07-03 MED ORDER — OXYCODONE-ACETAMINOPHEN 5-325 MG PO TABS: 1.0000 | ORAL_TABLET | ORAL | 0 refills | Status: DC | PRN

## 2019-07-03 MED ORDER — LORAZEPAM 2 MG/ML IJ SOLN: 0.0000 mg | Freq: Two times a day (BID) | INTRAMUSCULAR | Status: DC

## 2019-07-03 MED ORDER — LORAZEPAM 1 MG PO TABS: 1.0000 mg | ORAL_TABLET | Freq: Four times a day (QID) | ORAL | Status: DC | PRN
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
## 2019-07-03 NOTE — Discharge Instructions (Signed)
Head Injury, Adult There are many types of head injuries. They can be as minor as a bump. Some head injuries can be worse. Worse injuries include:  A strong hit to the head that shakes the brain back and forth causing damage (concussion).  A bruise (contusion) of the brain. This means there is bleeding in the brain that can cause swelling.  A cracked skull (skull fracture).  Bleeding in the brain that gathers, gets thick (makes a clot), and forms a bump (hematoma). Most problems from a head injury come in the first 24 hours. However, you may still have side effects up to 7-10 days after your injury. It is important to watch your condition for any changes. You may need to be watched in the emergency department or urgent care, or you may need to stay in the hospital. What are the causes? There are many possible causes of a head injury. A serious head injury may be caused by:  A car accident.  Bicycle or motorcycle accidents.  Sports injuries.  Falls. What are the signs or symptoms? Symptoms of a head injury include a bruise, bump, or bleeding where the injury happened. Other physical symptoms may include:  Headache.  Feeling sick to your stomach (nauseous) or vomiting.  Dizziness.  Feeling tired.  Being uncomfortable around bright lights or loud noises.  Shaking movements that you cannot control (seizures).  Trouble being woken up.  Passing out (fainting). Mental or emotional symptoms may include:  Feeling grumpy or cranky.  Confusion and memory problems.  Having trouble paying attention or concentrating.  Changes in eating or sleeping habits.  Feeling worried or nervous (anxious).  Feeling sad (depressed). How is this treated? Treatment for this condition depends on how severe the injury is and the type of injury you have. The main goal is to prevent complications and to allow the brain time to heal. Mild head injury If you have a mild head injury, you may be  sent home and treatment may include:  Being watched. A responsible adult should stay with you for 24 hours after your injury and check on you often.  Physical rest.  Brain rest.  Pain medicines. Severe head injury If you have a severe head injury, treatment may include:  Being watched closely. This includes hospitalization with frequent physical exams.  Medicines to: ? Help with pain. ? Prevent shaking movements that you cannot control. ? Help with brain swelling.  Using a machine that helps you breathe (ventilator).  Treatments to manage the swelling inside the brain.  Brain surgery. This may be needed to: ? Remove a blood clot. ? Stop the bleeding. ? Remove a part of the skull. This allows room for the brain to swell. Follow these instructions at home: Activity  Rest.  Avoid activities that are hard or tiring.  Make sure you get enough sleep.  Limit activities that need a lot of thought or attention, such as: ? Watching TV. ? Playing memory games and puzzles. ? Job-related work or homework. ? Working on [employer], and texting.  Avoid activities that could cause another head injury until your doctor says it is okay. This includes playing sports. Having another head injury, especially before the first one has healed, can be dangerous.  Ask your doctor when it is safe for you to go back to your normal activities, such as work or school. Ask your doctor for a step-by-step plan for slowly going back to your normal activities.  Ask  your doctor when you can drive, ride a bicycle, or use heavy machinery. Do not do these activities if you are dizzy. Lifestyle   Do not drink alcohol until your doctor says it is okay.  Do not use drugs.  If it is harder than usual to remember things, write them down.  If you are easily distracted, try to do one thing at a time.  Talk with family members or close friends when making important decisions.  Tell your  friends, family, a trusted coworker, and work Freight forwarder about your injury, symptoms, and limits (restrictions). Have them watch for any problems that are new or getting worse. General instructions  Take over-the-counter and prescription medicines only as told by your doctor.  Have someone stay with you for 24 hours after your head injury. This person should watch you for any changes in your symptoms and be ready to get help.  Keep all follow-up visits as told by your doctor. This is important. How is this prevented?  Work on Astr[employer]. This can help you avoid falls.  Wear a seatbelt when you are in a moving vehicle.  Wear a helmet when you: ? Ride a bicycle. ? Ski. ? Do any other sport or activity that has a risk of injury.  If you drink alcohol: ? Limit how much you use to: ? 0-1 drink a day for women. ? 0-2 drinks a day for men. ? Be aware of how much alcohol is in your drink. In the [place] one drink equals one 12 oz bottle of beer (355 mL), one 5 oz glass of wine (148 mL), or one 1 oz glass of hard liquor (44 mL).  Make your home safer by: ? Getting rid of clutter from the floors and stairs. This includes things that can make you trip. ? Using grab bars in bathrooms and handrails by stairs. ? Placing non-slip mats on floors and in bathtubs. ? Putting more light in dim areas. Get help right away if:  You have: ? A very bad headache that is not helped by medicine. ? Trouble walking or weakness in your arms and legs. ? Clear or bloody fluid coming from your nose or ears. ? Changes in how you see (vision). ? Shaking movements that you cannot control.  You lose your balance.  You vomit.  The black centers of your eyes (pupils) change in size.  Your speech is slurred.  Your dizziness gets worse.  You pass out.  You are sleepier than normal and have trouble staying awake.  Your symptoms get worse. These symptoms may be an emergency. Do not wait to see  if the symptoms will go away. Get medical help right away. Call your local emergency services (911 in the [place] Do not drive yourself to the hospital. Summary  There are many types of head injuries. They can be as minor as a bump. Some head injuries can be worse  Treatment for this condition depends on how severe the injury is and the type of injury you have.  Ask your doctor when it is safe for you to go back to your normal activities, such as work or school.  To prevent a head injury, wear a seat belt in a car, wear a helmet when you use a a bicycle, limit your alcohol use, and make your home safer. This information is not intended to replace advice given to you by your health care provider. Make sure you discuss any questions you  have with your health care provider. Document Released: 10/08/2008 Document Revised: 02/16/2019 Document Reviewed: 11/18/2018 Elsevier Patient Education  2020 [employer]   Alcohol Withdrawal Syndrome Alcohol withdrawal syndrome is a group of symptoms that can develop when a person who drinks heavily and regularly stops drinking or drinks less. Alcohol withdrawal syndrome can be mild or severe, and it may even be life-threatening. Alcohol withdrawal syndrome usually affects people who have alcohol use disorder, which may also be called alcoholism. Alcohol use disorder is when a person is unable to control his or her alcohol use, and drinking too much or too often causes problems at home, at work, or in relationships. What are the causes? Drinking heavily and drinking on a regular basis cause changes in brain chemistry. Over time, the body becomes dependent on alcohol. When alcohol use stops, the chemistry system in the brain becomes unbalanced and causes the symptoms of alcohol withdrawal. What increases the risk? Alcohol withdrawal syndrome is more likely to occur in people who drink more than the recommended limit of alcohol (2 drinks a day for men or 1 drink a  day for non-pregnant women). It is also more likely to affect heavy drinkers who have been using alcohol for long periods of time. The more a person drinks and the longer he or she drinks, the greater the risk of alcohol withdrawal syndrome. Severe withdrawal is more likely to develop in someone who:  Had severe alcohol withdrawal in the past.  Had a seizure during a previous episode of alcohol withdrawal.  Is elderly.  Uses other drugs.  Has a long-term (chronic) medical problem, such as heart, lung, or liver disease.  Has depression.  Does not get enough nutrients from his or her diet (malnutrition). What are the signs or symptoms? Symptoms of this condition can be mild to moderate, or they can be severe. Symptoms may develop a few hours (or up to a day) after a person changes his or her drinking patterns. During the 48 hours after he or she has stopped drinking, the following symptoms may go away or get better:  Uncontrollable shaking (tremor).  Sweating.  Headache.  Anxiety.  Inability to relax (agitation).  Trouble sleeping (insomnia).  Irregular heartbeats (palpitations).  Alcohol cravings.  Seizure. The following symptoms may get worse 24-48 hours after a person has decreased or stopped alcohol use, and they may gradually improve over a period of days or weeks:  Nausea and vomiting.  Fatigue.  Sensitivity to light and sounds.  Confusion and inability to think clearly.  Loss of appetite.  Mood swings, irritability, depression, and anxiety.  Insomnia and nightmares. The following symptoms are severe and life-threatening. When these symptoms occur together, they are called delirium tremens (DTs):  High blood pressure.  Increased heart rate.  Trouble breathing.  Seizures. These may go away along with other symptoms, or they may persist.  Seeing, hearing, feeling, smelling, or tasting things that are not there (hallucinations). If you experience  hallucinations, they usually begin 12-24 hours after a change in drinking patterns. Delirium tremens requires immediate hospitalization. How is this diagnosed? This condition may be diagnosed based on:  Your symptoms and medical history.  Your history of alcohol use. Your health care provider may ask questions about your drinking behavior. It is important to be honest when you answer these questions.  A psychological assessment.  A physical exam.  Blood tests or urine tests to measure blood alcohol level and to rule out other causes of symptoms.  MRI or CT scan. This may be done if you seem to have abnormal thinking or behaviors (altered mental status). Diagnosis can be difficult. People going through withdrawal often avoid seeking medical care and are not thinking clearly. Friends and family members play an important role in recognizing symptoms and encouraging loved ones to get treatment. How is this treated? Most people with symptoms of withdrawal can be treated outside of a hospital setting (outpatient treatment), with close monitoring such as daily check-ins with a health care provider and counseling. You may need treatment at a hospital or treatment center (inpatient treatment) if:  You have a history of delirium tremens or seizures.  You have severe symptoms.  You are addicted to other drugs.  You cannot swallow medicine.  You have a serious medical condition such as heart failure.  You experienced withdrawal in the past but then you continued drinking alcohol.  You are not likely to commit to an outpatient treatment schedule. Treatment may involve:  Monitoring your blood pressure, pulse, and breathing.  IV fluids to keep you hydrated.  Medicines to reduce withdrawal symptoms and discomfort (benzodiazepines).  Medicine to reduce anxiety.  Medicine to prevent or control seizures.  Multivitamins and B vitamins.  Having a health care provider check on you daily. It  is important to get treatment for alcohol withdrawal early. Getting treatment early can:  Speed up your recovery from withdrawal symptoms.  Make you more successful with long-term stoppage of alcohol use (sobriety). If you need help to stop drinking, your health care provider may recommend a long-term treatment plan that includes:  Medicines to help treat alcohol use disorder.  Substance abuse counseling.  Support groups. Follow these instructions at home:   Take over-the-counter and prescription medicines (including vitamin supplements) only as told by your health care provider.  Do not drink alcohol.  Do not drive until your health care provider approves.  Have someone you trust stay with you or be available if you need help with your symptoms or with not drinking.  Drink enough fluid to keep your urine pale yellow.  Consider joining an alcohol support group or treatment program. These can provide emotional support, advice, and guidance.  Keep all follow-up visits as told by your health care provider. This is important. Contact a health care provider if:  Your symptoms get worse instead of better.  You cannot eat or drink without vomiting.  You are struggling with not drinking alcohol.  You cannot stop drinking alcohol. Get help right away if:  You have an irregular heartbeat.  You have chest pain.  You have trouble breathing.  You have a seizure for the first time.  You hallucinate.  You become very confused. Summary  Alcohol withdrawal is a group of symptoms that can develop when a person who drinks heavily and regularly stops drinking or drinks less.  Symptoms of this condition can be mild to moderate, or they can be severe.  Treatment may include hospitalization, medicine, and counseling. This information is not intended to replace advice given to you by your health care provider. Make sure you discuss any questions you have with your health care  provider. Document Released: 08/05/2005 Document Revised: 10/08/2017 Document Reviewed: 07/02/2017 Elsevier Patient Education  2020 [employer]   Alcohol Abuse and Dependence Information, Adult Alcohol is a widely available drug. People drink alcohol in different amounts. People who drink alcohol very often and in large amounts often have problems during and after drinking. They may develop  what is called an alcohol use disorder. There are two main types of alcohol use disorders:  Alcohol abuse. This is when you use alcohol too much or too often. You may use alcohol to make yourself feel happy or to reduce stress. You may have a hard time setting a limit on the amount you drink.  Alcohol dependence. This is when you use alcohol consistently for a period of time, and your body changes as a result. This can make it hard to stop drinking because you may start to feel sick or feel different when you do not use alcohol. These symptoms are known as withdrawal. How can alcohol abuse and dependence affect me? Alcohol abuse and dependence can have a negative effect on your life. Drinking too much can lead to addiction. You may feel like you need alcohol to function normally. You may drink alcohol before work in the morning, during the day, or as soon as you get home from work in the evening. These actions can result in:  Poor work performance.  Job loss.  Financial problems.  Car crashes or criminal charges from driving after drinking alcohol.  Problems in your relationships with friends and family.  Losing the trust and respect of coworkers, friends, and family. Drinking heavily over a long period of time can permanently damage your body and brain, and can cause lifelong health issues, such as:  Damage to your liver or pancreas.  Heart problems, high blood pressure, or stroke.  Certain cancers.  Decreased ability to fight infections.  Brain or nerve damage.  Depression.  Early  (premature) death. If you are careless or you crave alcohol, it is easy to drink more than your body can handle (overdose). Alcohol overdose is a serious situation that requires hospitalization. It may lead to permanent injuries or death. What can increase my risk?  Having a family history of alcohol abuse.  Having depression or other mental health conditions.  Beginning to drink at an early age.  Binge drinking often.  Experiencing trauma, stress, and an unstable home life during childhood.  Spending time with people who drink often. What actions can I take to prevent or manage alcohol abuse and dependence?  Do not drink alcohol if: ? Your health care provider tells you not to drink. ? You are pregnant, may be pregnant, or are planning to become pregnant.  If you drink alcohol: ? Limit how much you use to:  0-1 drink a day for women.  0-2 drinks a day for men. ? Be aware of how much alcohol is in your drink. In the [place] one drink equals one 12 oz bottle of beer (355 mL), one 5 oz glass of wine (148 mL), or one 1 oz glass of hard liquor (44 mL).  Stop drinking if you have been drinking too much. This can be very hard to do if you are used to abusing alcohol. If you begin to have withdrawal symptoms, talk with your health care provider or a person that you trust. These symptoms may include anxiety, shaky hands, headache, nausea, sweating, or not being able to sleep.  Choose to drink nonalcoholic beverages in social gatherings and places where there may be alcohol. Activity  Spend more time on activities that you enjoy that do not involve alcohol, like hobbies or exercise.  Find healthy ways to cope with stress, such as exercise, meditation, or spending time with people you care about. General information  Talk to your family, coworkers, and friends about  supporting you in your efforts to stop drinking. If they drink, ask them not to drink around you. Spend more time with  people who do not drink alcohol.  If you think that you have an alcohol dependency problem: ? Tell friends or family about your concerns. ? Talk with your health care provider or another health professional about where to get help. ? Work with a Paramedictherapist and a Network engineerchemical dependency counselor. ? Consider joining a support group for people who struggle with alcohol abuse and dependence. Where to find support   Your health care provider.  SMART Recovery: www.smartrecovery.org Therapy and support groups  Local treatment centers or chemical dependency counselors.  Local AA groups in your community: SalaryStart.tnwww.aa.org Where to find more information  Centers for Disease Control and Prevention: FootballExhibition.com.brwww.cdc.gov  [employer] on Alcohol Abuse and Alcoholism: BasicStudents.dkwww.niaaa.nih.gov  Alcoholics Anonymous (AA): SalaryStart.tnwww.aa.org Contact a health care provider if:  You drank more or for longer than you intended on more than one occasion.  You tried to stop drinking or to cut back on how much you drink, but you were not able to.  You often drink to the point of vomiting or passing out.  You want to drink so badly that you cannot think about anything else.  You have problems in your life due to drinking, but you continue to drink.  You keep drinking even though you feel anxious, depressed, or have experienced memory loss.  You have stopped doing the things you used to enjoy in order to drink.  You have to drink more than you used to in order to get the effect you want.  You experience anxiety, sweating, nausea, shakiness, and trouble sleeping when you try to stop drinking. Get help right away if:  You have thoughts about hurting yourself or others.  You have serious withdrawal symptoms, including: ? Confusion. ? Racing heart. ? High blood pressure. ? Fever. If you ever feel like you may hurt yourself or others, or have thoughts about taking your own life, get help right away. You can go to your  nearest emergency department or call:  Your local emergency services (911 in the [place]  A suicide crisis helpline, such as the National Suicide Prevention Lifeline at 27020061471-(620) 106-9213. This is open 24 hours a day. Summary  Alcohol abuse and dependence can have a negative effect on your life. Drinking too much or too often can lead to addiction.  If you drink alcohol, limit how much you use.  If you are having trouble keeping your drinking under control, find ways to change your behavior. Hobbies, calming activities, exercise, or support groups can help.  If you feel you need help with changing your drinking habits, talk with your health care provider, a good friend, or a therapist, or go to an AA group. This information is not intended to replace advice given to you by your health care provider. Make sure you discuss any questions you have with your health care provider. Document Released: 10/20/2016 Document Revised: 02/14/2019 Document Reviewed: 01/03/2019 Elsevier Patient Education  2020 [employer]

## 2019-07-17 ENCOUNTER — Encounter (HOSPITAL_COMMUNITY): Payer: Self-pay | Admitting: *Deleted

## 2019-07-17 ENCOUNTER — Emergency Department (HOSPITAL_COMMUNITY)
Admission: EM | Admit: 2019-07-17 | Discharge: 2019-07-18 | Disposition: A | Discharge status: Home / Self Care | Payer: Self-pay | Attending: Emergency Medicine | Admitting: Emergency Medicine

## 2019-07-17 ENCOUNTER — Emergency Department (HOSPITAL_COMMUNITY): Payer: Self-pay

## 2019-07-17 DIAGNOSIS — Y908 Blood alcohol level of 240 mg/100 ml or more: Secondary | ICD-10-CM | POA: Insufficient documentation

## 2019-07-17 DIAGNOSIS — F10920 Alcohol use, unspecified with intoxication, uncomplicated: Secondary | ICD-10-CM

## 2019-07-17 DIAGNOSIS — R51 Headache: Secondary | ICD-10-CM | POA: Insufficient documentation

## 2019-07-17 DIAGNOSIS — S0101XD Laceration without foreign body of scalp, subsequent encounter: Secondary | ICD-10-CM | POA: Insufficient documentation

## 2019-07-17 DIAGNOSIS — F10220 Alcohol dependence with intoxication, uncomplicated: Secondary | ICD-10-CM | POA: Insufficient documentation

## 2019-07-17 DIAGNOSIS — X58XXXD Exposure to other specified factors, subsequent encounter: Secondary | ICD-10-CM | POA: Insufficient documentation

## 2019-07-17 DIAGNOSIS — Z4802 Encounter for removal of sutures: Secondary | ICD-10-CM

## 2019-07-17 DIAGNOSIS — F17210 Nicotine dependence, cigarettes, uncomplicated: Secondary | ICD-10-CM | POA: Insufficient documentation

## 2019-07-17 DIAGNOSIS — Z114 Encounter for screening for human immunodeficiency virus [HIV]: Secondary | ICD-10-CM | POA: Insufficient documentation

## 2019-07-17 HISTORY — DX: Traumatic subdural hemorrhage with loss of consciousness status unknown, initial encounter: S06.5XAA

## 2019-07-17 HISTORY — DX: Traumatic subdural hemorrhage with loss of consciousness of unspecified duration, initial encounter: S06.5X9A

## 2019-07-17 LAB — CBC WITH DIFFERENTIAL/PLATELET
Abs Immature Granulocytes: 0.01 10*3/uL (ref 0.00–0.07)
Basophils Absolute: 0.1 10*3/uL (ref 0.0–0.1)
Basophils Relative: 1 %
Eosinophils Absolute: 0.2 10*3/uL (ref 0.0–0.5)
Eosinophils Relative: 4 %
HCT: 43.9 % (ref 39.0–52.0)
Hemoglobin: 14.9 g/dL (ref 13.0–17.0)
Immature Granulocytes: 0 %
Lymphocytes Relative: 36 %
Lymphs Abs: 1.5 10*3/uL (ref 0.7–4.0)
MCH: 33.3 pg (ref 26.0–34.0)
MCHC: 33.9 g/dL (ref 30.0–36.0)
MCV: 98 fL (ref 80.0–100.0)
Monocytes Absolute: 0.3 10*3/uL (ref 0.1–1.0)
Monocytes Relative: 7 %
Neutro Abs: 2.1 10*3/uL (ref 1.7–7.7)
Neutrophils Relative %: 52 %
Platelets: 163 10*3/uL (ref 150–400)
RBC: 4.48 MIL/uL (ref 4.22–5.81)
RDW: 14.8 % (ref 11.5–15.5)
WBC: 4.1 10*3/uL (ref 4.0–10.5)
nRBC: 0 % (ref 0.0–0.2)

## 2019-07-17 LAB — BASIC METABOLIC PANEL
Anion gap: 15 (ref 5–15)
BUN: 5 mg/dL — ABNORMAL LOW (ref 6–20)
CO2: 24 mmol/L (ref 22–32)
Calcium: 8.3 mg/dL — ABNORMAL LOW (ref 8.9–10.3)
Chloride: 102 mmol/L (ref 98–111)
Creatinine, Ser: 0.55 mg/dL — ABNORMAL LOW (ref 0.61–1.24)
GFR calc Af Amer: >60 mL/min (ref >60)
GFR calc non Af Amer: >60 mL/min (ref >60)
Glucose, Bld: 119 mg/dL — ABNORMAL HIGH (ref 70–99)
Potassium: 3.3 mmol/L — ABNORMAL LOW (ref 3.5–5.1)
Sodium: 141 mmol/L (ref 135–145)

## 2019-07-17 LAB — ETHANOL: Alcohol, Ethyl (B): 514 mg/dL (ref <10)

## 2019-07-17 IMAGING — CT CT HEAD W/O CM
3 series · 15 of 47 positions shown, 18 images · non-contrast
Comparison: [DATE]

CLINICAL DATA: Per EMS, patient from the street, reports ETOH use
today. Staples to RT posterior head. Recent Subdural Hematoma

EXAM:
CT HEAD WITHOUT CONTRAST
TECHNIQUE: Contiguous axial images were obtained from the base of the skull
through the vertex without intravenous contrast.

[Series 3: head wo · axial · 0.51mm/px · z∈[-231,-81]mm · 9 of 36 slices shown, 12 images]
[im 3/36  brain]
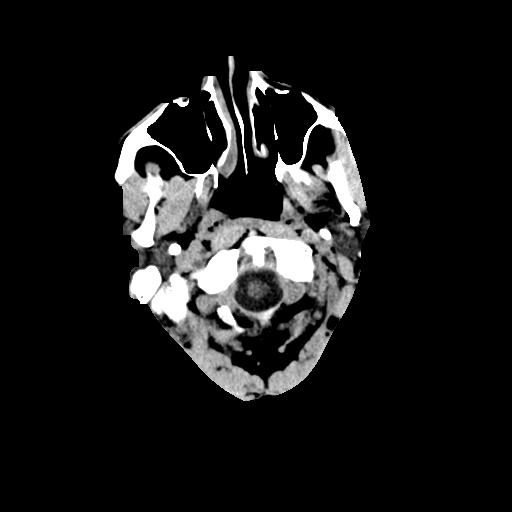
[im 3/36  bone]
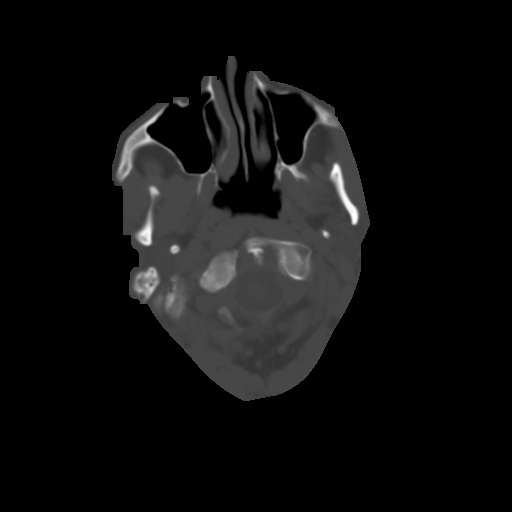
[im 7/36  brain]
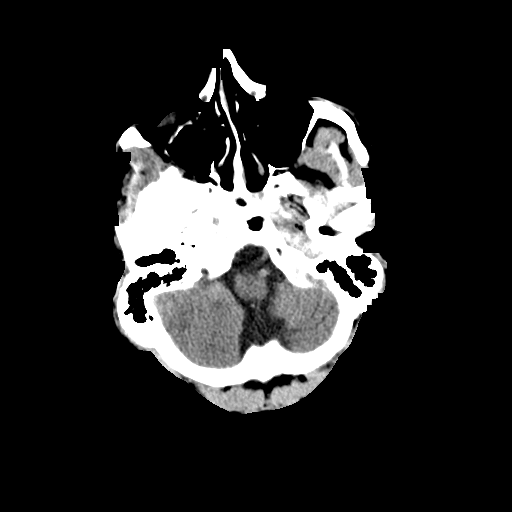
[im 10/36  brain]
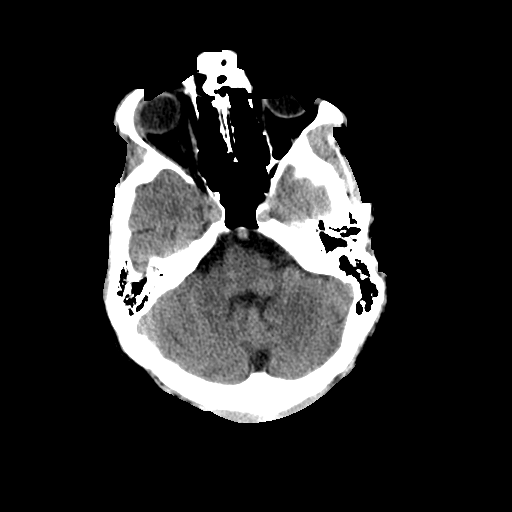
[im 14/36  brain]
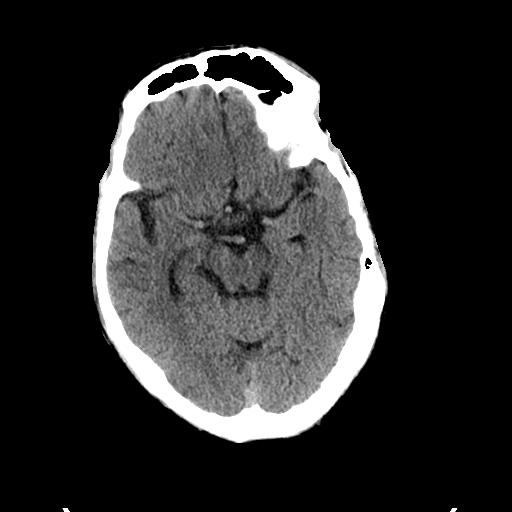
[im 19/36  brain]
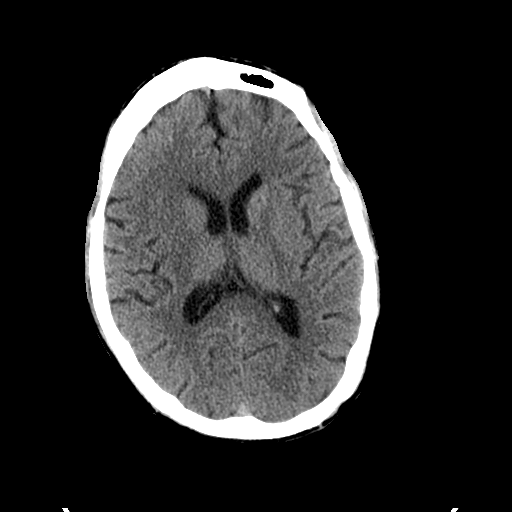
[im 19/36  bone]
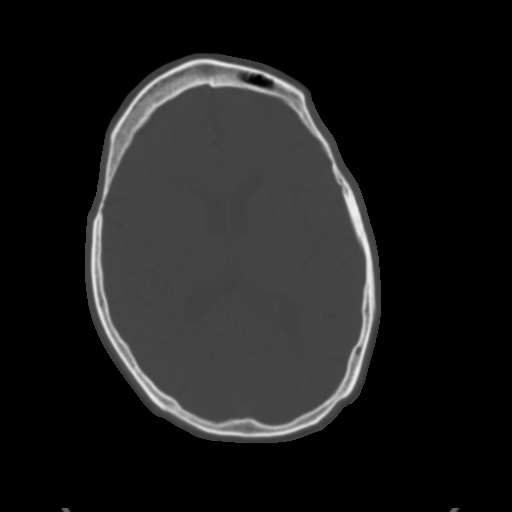
[im 22/36  brain]
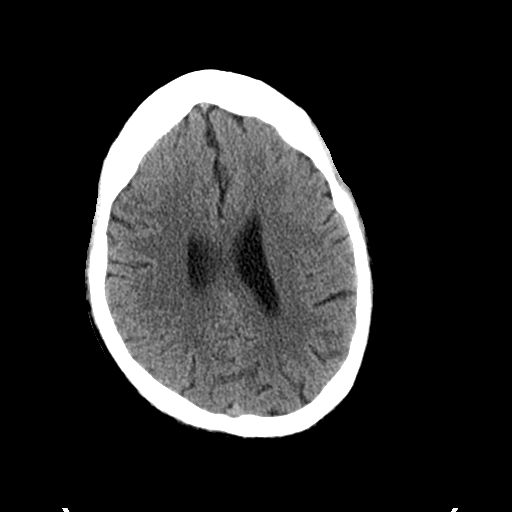
[im 26/36  brain]
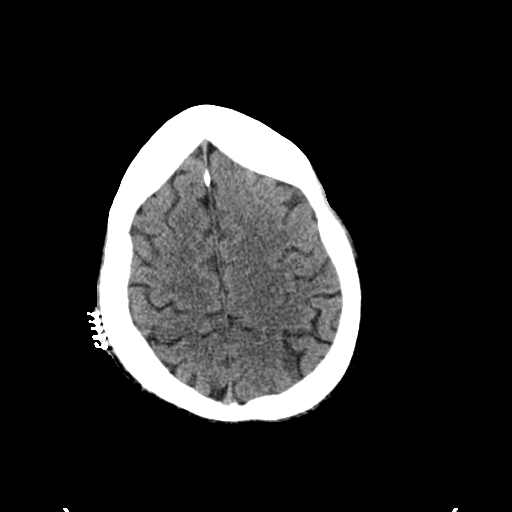
[im 29/36  brain]
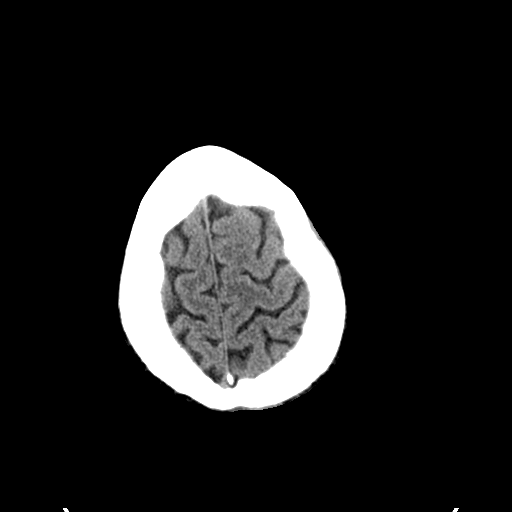
[im 33/36  brain]
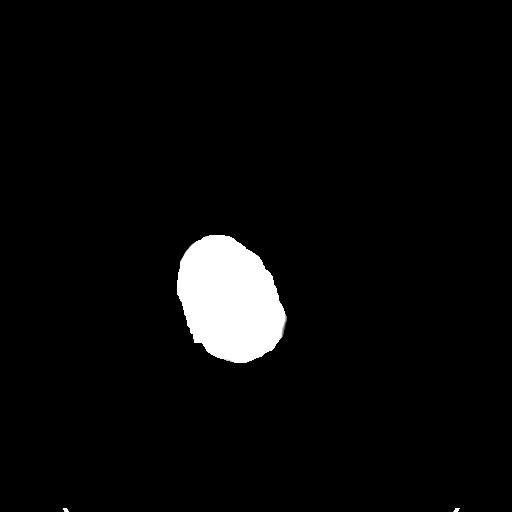
[im 33/36  bone]
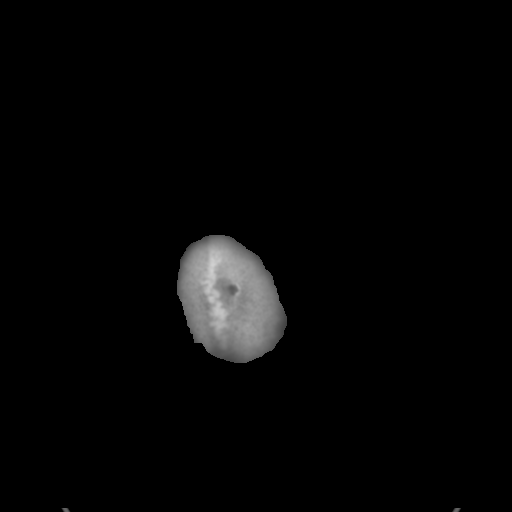

[Series 4: coronal soft tissue · coronal · 0.35mm/px · 3 of 82 slices shown]
[im 28/82  brain]
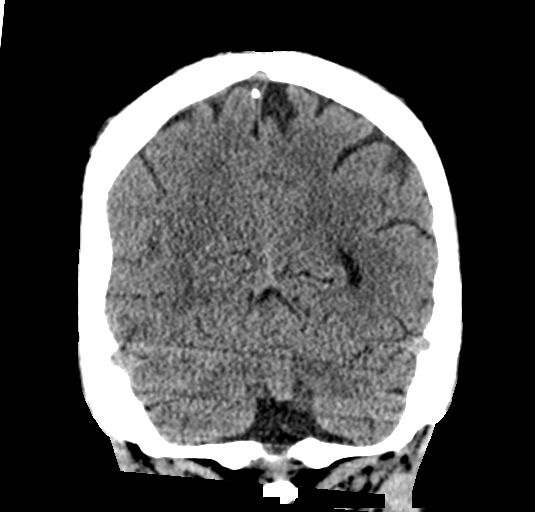
[im 37/82  brain]
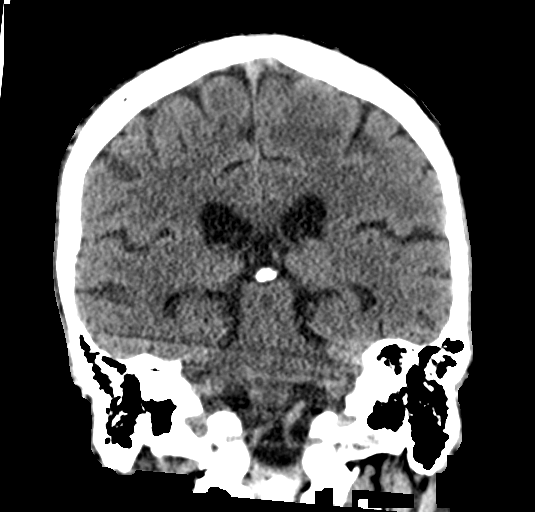
[im 46/82  brain]
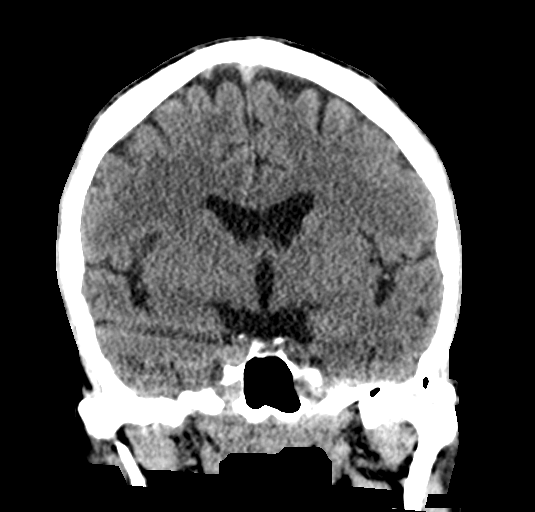

[Series 5: sagittal soft tissue · sagittal · 0.37mm/px · 3 of 57 slices shown]
[im 19/57  brain]
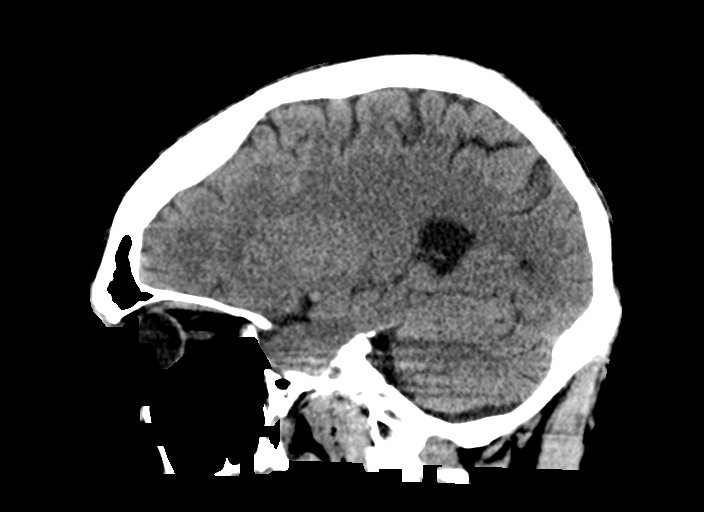
[im 29/57  brain]
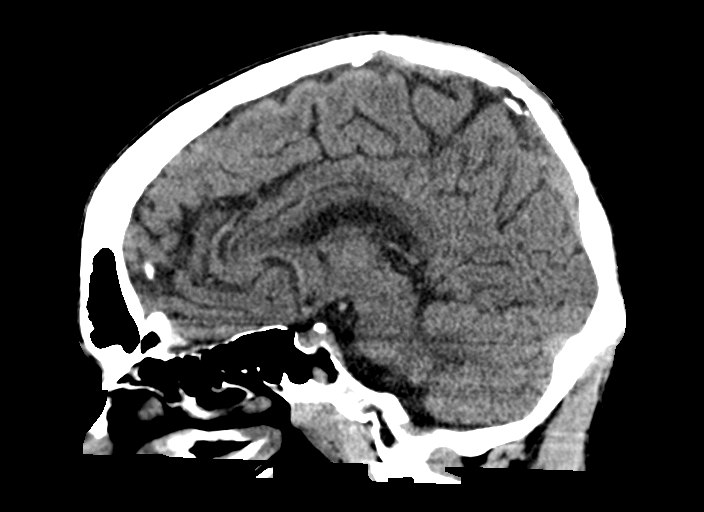
[im 38/57  brain]
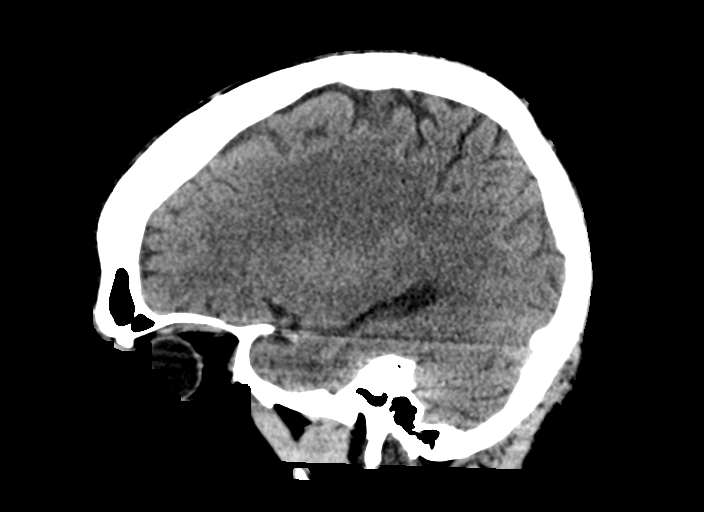

[15 of 47 positions shown; findings below may reference images not displayed]

FINDINGS: Brain: No evidence of acute infarction, hemorrhage, hydrocephalus,
extra-axial collection or mass lesion/mass effect. Interval
resolution of parafalcine subdural hematoma.

Vascular: No hyperdense vessel or unexpected calcification.

Skull: Normal. Negative for fracture or focal lesion.

Sinuses/Orbits: No acute finding.

Other: Skin clips overlie the RIGHT parietal scalp. No underlying
fracture.
IMPRESSION: 1. No evidence for acute intracranial abnormality.
2. Interval resolution of LEFT parafalcine subdural hematoma.
3. RIGHT parietal scalp laceration.

## 2019-07-17 MED ORDER — LORAZEPAM 1 MG PO TABS
0.0000 mg | ORAL_TABLET | Freq: Four times a day (QID) | ORAL | Status: DC
  Filled 2019-07-17: qty 1

## 2019-07-17 MED ORDER — LORAZEPAM 2 MG/ML IJ SOLN: 0.0000 mg | Freq: Two times a day (BID) | INTRAMUSCULAR | Status: DC

## 2019-07-17 MED ORDER — ONDANSETRON 4 MG PO TBDP
4.0000 mg | ORAL_TABLET | Freq: Once | ORAL | Status: AC
  Administered 2019-07-17: 4 mg via ORAL
  Filled 2019-07-17: qty 1

## 2019-07-17 MED ORDER — LORAZEPAM 1 MG PO TABS
1.0000 mg | ORAL_TABLET | Freq: Once | ORAL | Status: AC
  Administered 2019-07-17: 1 mg via ORAL
  Filled 2019-07-17: qty 1

## 2019-07-17 MED ORDER — LORAZEPAM 2 MG/ML IJ SOLN: 0.0000 mg | Freq: Four times a day (QID) | INTRAMUSCULAR | Status: DC

## 2019-07-17 MED ORDER — VITAMIN B-1 100 MG PO TABS
100.0000 mg | ORAL_TABLET | Freq: Every day | ORAL | Status: DC
  Administered 2019-07-17: 100 mg via ORAL
  Filled 2019-07-17: qty 1

## 2019-07-17 MED ORDER — LORAZEPAM 1 MG PO TABS
0.0000 mg | ORAL_TABLET | Freq: Two times a day (BID) | ORAL | Status: DC
  Administered 2019-07-18: 1 mg via ORAL

## 2019-07-17 MED ORDER — THIAMINE HCL 100 MG/ML IJ SOLN: 100.0000 mg | Freq: Every day | INTRAMUSCULAR | Status: DC

## 2019-07-17 NOTE — ED Provider Notes (Signed)
Care assumed from previous provider PA Harris. Please see note for further details. Case discussed, plan agreed upon. Will continue to monitor patient with likely discharge once clinically sober.   Patient evaluated. Complaining of nausea and still feeling dizzy. Given zofran and will continue to monitor.   Nursing note about ambulation noted. Patient re-evaluated. No longer nauseous. Tolerating PO. I personally ambulated patient with attending, Dr. Randal Buba present. Ambulating independently. Feel he is safe for discharge. Substance abuse resources provided.    Ward, Ozella Almond, PA-C 07/18/19 [id number]    Randal Buba, April, MD 07/18/19 [id number]

## 2019-07-17 NOTE — ED Notes (Signed)
Pt transported to CT ?

## 2019-07-17 NOTE — ED Notes (Signed)
Patient returned from Radiology. 

## 2019-07-17 NOTE — ED Provider Notes (Signed)
[facility] DEPT Provider Note   CSN: [id number] Arrival date & time: 07/17/19  1348     History   Chief Complaint Chief Complaint  Patient presents with  . Alcohol Intoxication    HPI Gary [name] is a 33 y.o. male with a past medical history of HIV, stop polysubstance abuse, severe alcohol abuse and dependence, recent subdural hematoma with admission.  The patient was brought in by EMS today for public intoxication.  Patient states that he "just drinks as much as I can."  He states "I am just a drinking bastard."  He is unable to quantify how much alcohol he has had today but he states that he drinks a lot more now because his head hurts.  Nursing notes dated the patient has staples in the back of his head.  I am unable to find procedure for where these were placed recently.    HPI  Past Medical History:  Diagnosis Date  . Alcohol abuse   . HIV (human immunodeficiency virus infection) ([place])   . Subdural hematoma [facility])     Patient Active Problem List   Diagnosis Date Noted  . Traumatic subdural hematoma ([place]) 07/02/2019  . Alcohol abuse with intoxication ([place]) 07/02/2019  . Hypocalcemia 07/02/2019  . Alcohol abuse with alcohol-induced mood disorder ([place]) 05/13/2019  . MDD (major depressive disorder), recurrent severe, without psychosis ([place]) 04/16/2019  . Alcohol dependence ([place]) 04/16/2019  . Major depressive disorder, recurrent severe without psychotic features ([place]) 04/16/2019  . HIV disease ([place]) 06/16/2018  . Polysubstance abuse ([place]) 06/16/2018  . Cigarette smoker 06/16/2018  . Penile cellulitis 06/15/2018    Past Surgical History:  Procedure Laterality Date  . INCISION AND DRAINAGE PERIRECTAL ABSCESS N/A 09/24/2016   Procedure: IRRIGATION AND DEBRIDEMENT PERIRECTAL ABSCESS;  Surgeon: Clayburn Pert, MD;  Location: [facility] ORS;  Service: General;  Laterality: N/A;  . none          Home Medications    Prior to Admission  medications   Medication Sig Start Date End Date Taking? Authorizing Provider  oxyCODONE-acetaminophen (PERCOCET) 5-325 MG tablet Take 1 tablet by mouth every 4 (four) hours as needed for severe pain. 07/03/19   Black, Lezlie Octave, NP  famotidine (PEPCID) 20 MG tablet Take 1 tablet (20 mg total) by mouth 2 (two) times daily. Patient not taking: Reported on 06/01/2019 05/14/19 06/02/19  Davonna Belling, MD    Family History Family History  Problem Relation Age of Onset  . Alcohol abuse Mother   . Alcohol abuse Father     Social History Social History   Tobacco Use  . Smoking status: Current Every Day Smoker    Packs/day: 0.50    Types: Cigarettes  . Smokeless tobacco: Never Used  Substance Use Topics  . Alcohol use: Yes    Alcohol/week: 3.0 standard drinks    Types: 3 Cans of beer per week    Comment: 24 oz  . Drug use: Yes    Types: Marijuana, Methamphetamines    Comment: last used 04/10/2019     Allergies   Caffeine   Review of Systems Review of Systems  Ten systems reviewed and are negative for acute change, except as noted in the HPI.   Physical Exam Updated Vital Signs BP (!) 122/92   Pulse (!) 103   Temp 98.6 F (37 C) (Oral)   Resp 19   SpO2 97%   Physical Exam Vitals signs and nursing note reviewed.  Constitutional:  General: He is not in acute distress.    Appearance: He is well-developed. He is not diaphoretic.  HENT:     Head: Normocephalic.     Comments: Crusted scab over the superior occiput.  Eyes:     General: No scleral icterus.    Conjunctiva/sclera: Conjunctivae normal.  Neck:     Musculoskeletal: Normal range of motion and neck supple.  Cardiovascular:     Rate and Rhythm: Normal rate and regular rhythm.     Heart sounds: Normal heart sounds.  Pulmonary:     Effort: Pulmonary effort is normal. No respiratory distress.     Breath sounds: Normal breath sounds.  Abdominal:     Palpations: Abdomen is soft.     Tenderness: There is no  abdominal tenderness.  Skin:    General: Skin is warm and dry.  Neurological:     Mental Status: He is alert.     Comments: Patient clearly intoxicated. Speech is somewhat slurred.  Psychiatric:        Behavior: Behavior normal.      ED Treatments / Results  Labs (all labs ordered are listed, but only abnormal results are displayed) Labs Reviewed - No data to display  EKG None  Radiology No results found.  Procedures .Suture Removal  Date/Time: 07/17/2019 7:59 PM Performed by: Arthor CaptainHarris, Abigail, PA-C Authorized by: Arthor CaptainHarris, Abigail, PA-C   Consent:    Consent obtained:  Verbal   Consent given by:  Patient   Risks discussed:  Pain and wound separation   Alternatives discussed:  Delayed treatment Location:    Location:  Head/neck   Head/neck location:  Scalp Procedure details:    Wound appearance:  No signs of infection   Number of staples removed:  5 Post-procedure details:    Post-removal:  No dressing applied   Patient tolerance of procedure:  Tolerated well, no immediate complications   (including critical care time)  Medications Ordered in ED Medications - No data to display   Initial Impression / Assessment and Plan / ED Course  I have reviewed the triage vital signs and the nursing notes.  Pertinent labs & imaging results that were available during my care of the patient were reviewed by me and considered in my medical decision making (see chart for details).       Here with acute alcohol intoxication.  He has been very polite but is still quite intoxicated.  He is starting to get shaky.  I have ordered CIWA protocol.  I removed the staples from the patient's head.  Patient with mild hypokalemia.  CBC without significant abnormality.  Blood alcohol level at 514.Marland Kitchen.  Patient states that he is currently getting shaky.  Have ordered [place] protocol.  I have given signout to PA Ward who will assume care of the patient as he is still clinically intoxicated and not  safe to ambulate.  I personally reviewed the patient's CT scan which shows interval resolution of the patient's subdural hematoma  Final Clinical Impressions(s) / ED Diagnoses   Final diagnoses:  None    ED Discharge Orders    None       Arthor CaptainHarris, Abigail, PA-C 07/17/19 2006    Cathren LaineSteinl, Kevin, MD 07/18/19 [phone]

## 2019-07-17 NOTE — ED Notes (Signed)
Seizure pads placed on bed as precaution.

## 2019-07-17 NOTE — ED Notes (Signed)
Patient resting quietly. VSS.

## 2019-07-17 NOTE — ED Triage Notes (Signed)
Per EMS, patient from the street, reports ETOH use today. Staples to posterior head.  BP 128/81 CBG 142

## 2019-07-18 MED FILL — Rivaroxaban Tab 15 MG: ORAL | Qty: 1 | Status: AC

## 2019-07-18 NOTE — ED Notes (Signed)
Ambulated pt without assistance.  Pt did complain of intermittent dizziness. Pt was able to regain his balance and be seated back on the bed without incident.

## 2019-07-24 ENCOUNTER — Encounter (HOSPITAL_COMMUNITY): Payer: Self-pay | Admitting: Emergency Medicine

## 2019-07-24 ENCOUNTER — Other Ambulatory Visit: Payer: Self-pay

## 2019-07-24 ENCOUNTER — Inpatient Hospital Stay (HOSPITAL_COMMUNITY)
Admission: EM | Admit: 2019-07-24 | Discharge: 2019-07-30 | DRG: 897 | Disposition: A | Discharge status: Home / Self Care | Payer: Self-pay | Attending: Family Medicine | Admitting: Family Medicine

## 2019-07-24 DIAGNOSIS — R740 Nonspecific elevation of levels of transaminase and lactic acid dehydrogenase [LDH]: Secondary | ICD-10-CM | POA: Diagnosis present

## 2019-07-24 DIAGNOSIS — F10229 Alcohol dependence with intoxication, unspecified: Secondary | ICD-10-CM | POA: Diagnosis present

## 2019-07-24 DIAGNOSIS — F10231 Alcohol dependence with withdrawal delirium: Principal | ICD-10-CM | POA: Diagnosis present

## 2019-07-24 DIAGNOSIS — F10129 Alcohol abuse with intoxication, unspecified: Secondary | ICD-10-CM | POA: Diagnosis present

## 2019-07-24 DIAGNOSIS — R451 Restlessness and agitation: Secondary | ICD-10-CM

## 2019-07-24 DIAGNOSIS — Z9119 Patient's noncompliance with other medical treatment and regimen: Secondary | ICD-10-CM

## 2019-07-24 DIAGNOSIS — A044 Other intestinal Escherichia coli infections: Secondary | ICD-10-CM | POA: Diagnosis present

## 2019-07-24 DIAGNOSIS — R197 Diarrhea, unspecified: Secondary | ICD-10-CM | POA: Diagnosis present

## 2019-07-24 DIAGNOSIS — F10921 Alcohol use, unspecified with intoxication delirium: Secondary | ICD-10-CM | POA: Diagnosis present

## 2019-07-24 DIAGNOSIS — D696 Thrombocytopenia, unspecified: Secondary | ICD-10-CM | POA: Diagnosis present

## 2019-07-24 DIAGNOSIS — Z590 Homelessness: Secondary | ICD-10-CM

## 2019-07-24 DIAGNOSIS — Z87820 Personal history of traumatic brain injury: Secondary | ICD-10-CM

## 2019-07-24 DIAGNOSIS — Y908 Blood alcohol level of 240 mg/100 ml or more: Secondary | ICD-10-CM | POA: Diagnosis present

## 2019-07-24 DIAGNOSIS — F17210 Nicotine dependence, cigarettes, uncomplicated: Secondary | ICD-10-CM | POA: Diagnosis present

## 2019-07-24 DIAGNOSIS — F10232 Alcohol dependence with withdrawal with perceptual disturbance: Secondary | ICD-10-CM

## 2019-07-24 DIAGNOSIS — R748 Abnormal levels of other serum enzymes: Secondary | ICD-10-CM | POA: Diagnosis present

## 2019-07-24 DIAGNOSIS — B20 Human immunodeficiency virus [HIV] disease: Secondary | ICD-10-CM | POA: Diagnosis present

## 2019-07-24 DIAGNOSIS — R072 Precordial pain: Secondary | ICD-10-CM | POA: Diagnosis present

## 2019-07-24 DIAGNOSIS — F10929 Alcohol use, unspecified with intoxication, unspecified: Secondary | ICD-10-CM | POA: Diagnosis present

## 2019-07-24 DIAGNOSIS — F10939 Alcohol use, unspecified with withdrawal, unspecified: Secondary | ICD-10-CM | POA: Diagnosis present

## 2019-07-24 DIAGNOSIS — F10932 Alcohol use, unspecified with withdrawal with perceptual disturbance: Secondary | ICD-10-CM

## 2019-07-24 DIAGNOSIS — Z21 Asymptomatic human immunodeficiency virus [HIV] infection status: Secondary | ICD-10-CM | POA: Diagnosis present

## 2019-07-24 DIAGNOSIS — F332 Major depressive disorder, recurrent severe without psychotic features: Secondary | ICD-10-CM | POA: Diagnosis present

## 2019-07-24 DIAGNOSIS — F10920 Alcohol use, unspecified with intoxication, uncomplicated: Secondary | ICD-10-CM | POA: Diagnosis present

## 2019-07-24 DIAGNOSIS — Z20828 Contact with and (suspected) exposure to other viral communicable diseases: Secondary | ICD-10-CM | POA: Diagnosis present

## 2019-07-24 DIAGNOSIS — F10239 Alcohol dependence with withdrawal, unspecified: Secondary | ICD-10-CM | POA: Diagnosis present

## 2019-07-24 DIAGNOSIS — R7401 Elevation of levels of liver transaminase levels: Secondary | ICD-10-CM | POA: Diagnosis present

## 2019-07-24 DIAGNOSIS — Z811 Family history of alcohol abuse and dependence: Secondary | ICD-10-CM

## 2019-07-24 LAB — CBC WITH DIFFERENTIAL/PLATELET
Abs Immature Granulocytes: 0 10*3/uL (ref 0.00–0.07)
Basophils Absolute: 0 10*3/uL (ref 0.0–0.1)
Basophils Relative: 1 %
Eosinophils Absolute: 0.1 10*3/uL (ref 0.0–0.5)
Eosinophils Relative: 2 %
HCT: 41.1 % (ref 39.0–52.0)
Hemoglobin: 13.9 g/dL (ref 13.0–17.0)
Immature Granulocytes: 0 %
Lymphocytes Relative: 37 %
Lymphs Abs: 1.5 10*3/uL (ref 0.7–4.0)
MCH: 33.3 pg (ref 26.0–34.0)
MCHC: 33.8 g/dL (ref 30.0–36.0)
MCV: 98.6 fL (ref 80.0–100.0)
Monocytes Absolute: 0.3 10*3/uL (ref 0.1–1.0)
Monocytes Relative: 7 %
Neutro Abs: 2.2 10*3/uL (ref 1.7–7.7)
Neutrophils Relative %: 53 %
Platelets: 83 10*3/uL — ABNORMAL LOW (ref 150–400)
RBC: 4.17 MIL/uL — ABNORMAL LOW (ref 4.22–5.81)
RDW: 14.6 % (ref 11.5–15.5)
WBC: 4.1 10*3/uL (ref 4.0–10.5)
nRBC: 0 % (ref 0.0–0.2)

## 2019-07-24 LAB — ETHANOL: Alcohol, Ethyl (B): 506 mg/dL (ref <10)

## 2019-07-24 LAB — COMPREHENSIVE METABOLIC PANEL
ALT: 98 U/L — ABNORMAL HIGH (ref 0–44)
AST: 150 U/L — ABNORMAL HIGH (ref 15–41)
Albumin: 3.6 g/dL (ref 3.5–5.0)
Alkaline Phosphatase: 83 U/L (ref 38–126)
Anion gap: 12 (ref 5–15)
BUN: 5 mg/dL — ABNORMAL LOW (ref 6–20)
CO2: 22 mmol/L (ref 22–32)
Calcium: 8.3 mg/dL — ABNORMAL LOW (ref 8.9–10.3)
Chloride: 106 mmol/L (ref 98–111)
Creatinine, Ser: 0.67 mg/dL (ref 0.61–1.24)
GFR calc Af Amer: >60 mL/min (ref >60)
GFR calc non Af Amer: >60 mL/min (ref >60)
Glucose, Bld: 137 mg/dL — ABNORMAL HIGH (ref 70–99)
Potassium: 3.6 mmol/L (ref 3.5–5.1)
Sodium: 140 mmol/L (ref 135–145)
Total Bilirubin: 0.7 mg/dL (ref 0.3–1.2)
Total Protein: 7 g/dL (ref 6.5–8.1)

## 2019-07-24 LAB — TROPONIN I (HIGH SENSITIVITY): Troponin I (High Sensitivity): 12 ng/L (ref <18)

## 2019-07-24 LAB — ACETAMINOPHEN LEVEL: Acetaminophen (Tylenol), Serum: <10 ug/mL — ABNORMAL LOW (ref 10–30)

## 2019-07-24 LAB — SALICYLATE LEVEL: Salicylate Lvl: <7 mg/dL (ref 2.8–30.0)

## 2019-07-24 MED ORDER — LORAZEPAM 2 MG/ML IJ SOLN
0.0000 mg | Freq: Four times a day (QID) | INTRAMUSCULAR | Status: DC
  Administered 2019-07-25: 2 mg via INTRAVENOUS
  Administered 2019-07-25: 4 mg via INTRAVENOUS
  Administered 2019-07-25: 2 mg via INTRAVENOUS
  Filled 2019-07-24 (×3): qty 1
  Filled 2019-07-24 (×2): qty 2

## 2019-07-24 MED ORDER — THIAMINE HCL 100 MG/ML IJ SOLN
100.0000 mg | Freq: Every day | INTRAMUSCULAR | Status: DC
  Administered 2019-07-25 (×2): 100 mg via INTRAVENOUS
  Filled 2019-07-24 (×2): qty 2

## 2019-07-24 MED ORDER — LORAZEPAM 2 MG/ML IJ SOLN
0.0000 mg | Freq: Two times a day (BID) | INTRAMUSCULAR | Status: DC
  Administered 2019-07-25: 2 mg via INTRAVENOUS

## 2019-07-24 MED ORDER — VITAMIN B-1 100 MG PO TABS
100.0000 mg | ORAL_TABLET | Freq: Every day | ORAL | Status: DC
  Administered 2019-07-26 – 2019-07-30 (×5): 100 mg via ORAL
  Filled 2019-07-24 (×6): qty 1

## 2019-07-24 MED ORDER — LORAZEPAM 2 MG/ML IJ SOLN
1.0000 mg | Freq: Once | INTRAMUSCULAR | Status: DC
  Filled 2019-07-24: qty 1

## 2019-07-24 MED ORDER — LORAZEPAM 1 MG PO TABS: 0.0000 mg | ORAL_TABLET | Freq: Two times a day (BID) | ORAL | Status: DC

## 2019-07-24 MED ORDER — OLANZAPINE 5 MG PO TBDP
10.0000 mg | ORAL_TABLET | Freq: Every day | ORAL | Status: DC
  Administered 2019-07-25: 10 mg via ORAL
  Filled 2019-07-24 (×4): qty 2

## 2019-07-24 MED ORDER — LORAZEPAM 1 MG PO TABS: 0.0000 mg | ORAL_TABLET | Freq: Four times a day (QID) | ORAL | Status: DC

## 2019-07-24 NOTE — ED Triage Notes (Signed)
Per EMS, found [place] gas station, called EMS b/c his heart was "going to explode."  Aggressive, given 2.5mg  of IV versed.  Reports he had a "shit load of wine."    128/82 110-120 heart rate RR 22 CBG 221 98%RA 97.9temp  Was aggressive in triage, had to be assisted to stay on the gurney by EMS/[employer] and fire.    PA in triage

## 2019-07-24 NOTE — ED Provider Notes (Signed)
[facility] EMERGENCY DEPARTMENT Provider Note   CSN: [id number] Arrival date & time: 07/24/19  2241     History   Chief Complaint Chief Complaint  Patient presents with   Alcohol Intoxication   Chest Pain   Suicidal    HPI Gary [name] is a 33 y.o. male.     Per EMS, called to gas station by patient for chest pain and 'panic'. He reports drinking several bottles of wine today. Combative with EMS who gave 2.5 mg Versed with de-escalation of behavior. The patient reports chest pain today. No vomiting. No SoB, cough, fever. He has had same symptoms in the past but denies diagnosed heart disease. He states he drinks heavily everyday and does not intend to stop. Denies other substance use. He makes the statement that he is committing suicide by alcohol. No HI.   The history is provided by the patient and the EMS personnel.  Alcohol Intoxication Associated symptoms include chest pain.  Chest Pain   Past Medical History:  Diagnosis Date   Alcohol abuse    HIV (human immunodeficiency virus infection) ([place])    Subdural hematoma [facility])     Patient Active Problem List   Diagnosis Date Noted   Traumatic subdural hematoma ([place]) 07/02/2019   Alcohol abuse with intoxication ([place]) 07/02/2019   Hypocalcemia 07/02/2019   Alcohol abuse with alcohol-induced mood disorder ([place]) 05/13/2019   MDD (major depressive disorder), recurrent severe, without psychosis ([place]) 04/16/2019   Alcohol dependence ([place]) 04/16/2019   Major depressive disorder, recurrent severe without psychotic features ([place]) 04/16/2019   HIV disease ([place]) 06/16/2018   Polysubstance abuse ([place]) 06/16/2018   Cigarette smoker 06/16/2018   Penile cellulitis 06/15/2018    Past Surgical History:  Procedure Laterality Date   INCISION AND DRAINAGE PERIRECTAL ABSCESS N/A 09/24/2016   Procedure: IRRIGATION AND DEBRIDEMENT PERIRECTAL ABSCESS;  Surgeon: Clayburn Pert, MD;  Location: [facility]  ORS;  Service: General;  Laterality: N/A;   none          Home Medications    Prior to Admission medications   Medication Sig Start Date End Date Taking? Authorizing Provider  oxyCODONE-acetaminophen (PERCOCET) 5-325 MG tablet Take 1 tablet by mouth every 4 (four) hours as needed for severe pain. Patient not taking: Reported on 07/17/2019 07/03/19   Radene Gunning, NP  famotidine (PEPCID) 20 MG tablet Take 1 tablet (20 mg total) by mouth 2 (two) times daily. Patient not taking: Reported on 06/01/2019 05/14/19 06/02/19  Davonna Belling, MD    Family History Family History  Problem Relation Age of Onset   Alcohol abuse Mother    Alcohol abuse Father     Social History Social History   Tobacco Use   Smoking status: Current Every Day Smoker    Packs/day: 0.50    Types: Cigarettes   Smokeless tobacco: Never Used  Substance Use Topics   Alcohol use: Yes    Alcohol/week: 3.0 standard drinks    Types: 3 Cans of beer per week    Comment: 24 oz   Drug use: Yes    Types: Marijuana, Methamphetamines    Comment: last used 04/10/2019     Allergies   Caffeine   Review of Systems Review of Systems  Unable to perform ROS: Other (alcohol intoxication)  Cardiovascular: Positive for chest pain.     Physical Exam Updated Vital Signs BP (!) 133/97 (BP Location: Right Arm)    Pulse (!) 108    Temp 98.8 F (37.1  C) (Oral)    Resp (!) 21    Ht 6\' 1"  (1.854 m)    Wt 68 kg    SpO2 96%    BMI 19.79 kg/m   Physical Exam Vitals signs and nursing note reviewed.  Constitutional:      Appearance: He is well-developed.     Comments: Acutely intoxicated  HENT:     Head: Normocephalic.     Comments: Healing scalp laceration - staples absent. Eyes:     Pupils: Pupils are equal, round, and reactive to light.  Neck:     Musculoskeletal: Normal range of motion and neck supple.  Cardiovascular:     Rate and Rhythm: Regular rhythm. Tachycardia present.  Pulmonary:     Effort:  Pulmonary effort is normal.     Breath sounds: Normal breath sounds. No wheezing, rhonchi or rales.  Abdominal:     General: Bowel sounds are normal.     Palpations: Abdomen is soft.     Tenderness: There is no abdominal tenderness. There is no guarding or rebound.  Musculoskeletal: Normal range of motion.  Skin:    General: Skin is warm and dry.     Findings: No rash.  Neurological:     Mental Status: He is alert.     Comments: Follows command. Awake, oriented to person and place. Moves all extremities with equal strength.  Psychiatric:        Behavior: Behavior is agitated and aggressive.        Judgment: Judgment is impulsive and inappropriate.     Comments: Patient frequently escalates to physical agitation, directable. Question suicidal ideation vs intoxication.      ED Treatments / Results  Labs (all labs ordered are listed, but only abnormal results are displayed) Labs Reviewed  CBC WITH DIFFERENTIAL/PLATELET  ETHANOL  COMPREHENSIVE METABOLIC PANEL  RAPID URINE DRUG SCREEN, HOSP PERFORMED  SALICYLATE LEVEL  ACETAMINOPHEN LEVEL  TROPONIN I (HIGH SENSITIVITY)    EKG None  Radiology No results found.  Procedures Procedures (including critical care time) CRITICAL CARE Performed by: Arnoldo Hooker   Total critical care time: 50 minutes  Critical care time was exclusive of separately billable procedures and treating other patients.  Critical care was necessary to treat or prevent imminent or life-threatening deterioration.  Critical care was time spent personally by me on the following activities: development of treatment plan with patient and/or surrogate as well as nursing, discussions with consultants, evaluation of patient's response to treatment, examination of patient, obtaining history from patient or surrogate, ordering and performing treatments and interventions, ordering and review of laboratory studies, ordering and review of radiographic studies,  pulse oximetry and re-evaluation of patient's condition.  Medications Ordered in ED Medications  OLANZapine zydis (ZYPREXA) disintegrating tablet 10 mg (has no administration in time range)  LORazepam (ATIVAN) injection 1 mg (has no administration in time range)     Initial Impression / Assessment and Plan / ED Course  I have reviewed the triage vital signs and the nursing notes.  Pertinent labs & imaging results that were available during my care of the patient were reviewed by me and considered in my medical decision making (see chart for details).        Patient to ED by EMS, called by patient for chest pain, "panic". History of same. Also makes statements of suicidal nature, "I am committing suicide by alcohol.' Will need to re-evaluate when more coherent.  Called by nursing staff for physical agitation and aggressiveness. [employer]  at bedside.   On my exam, he is intoxicated, awake, loud voice with constant profanity but not aggressive. Dr. Madilyn Hookees is active in patient care and assessment. EKG show sinus tachycardia only. Labs ordered in evaluation of chest pain - no history of heart disease. Zyprexa ordered. Patient placed into room.   ETOH over 500, similar lab finding previous hospitalizations. VSS. CIWA protocol in place.   Patient now sleeping, VSS. No further behavioral aggression/agitation.   Informed by nursing that he is awake and vomiting. Zofran ordered. EKG, initial troponin are negative for concern for ischemia.   He will be observed closely until he is sober enough for re-assessment.   Patient care signed out to Emily Filbertoug Delo, MD, to continue observation.   Final Clinical Impressions(s) / ED Diagnoses   Final diagnoses:  None   1. Alcohol intoxication 2. Agitated behavior 3. Passive suicidal ideations  ED Discharge Orders    None       Danne HarborUpstill, Aerilyn Slee, PA-C 07/25/19 [id number]    Tilden Fossaees, Elizabeth, MD 07/26/19 [phone]

## 2019-07-24 NOTE — ED Triage Notes (Signed)
While Provider was in room, admitted to suicidal thoughts, "i'm going to drink myself to death, suicide by wine."

## 2019-07-25 DIAGNOSIS — R197 Diarrhea, unspecified: Secondary | ICD-10-CM | POA: Diagnosis present

## 2019-07-25 DIAGNOSIS — F10239 Alcohol dependence with withdrawal, unspecified: Secondary | ICD-10-CM | POA: Diagnosis present

## 2019-07-25 DIAGNOSIS — B20 Human immunodeficiency virus [HIV] disease: Secondary | ICD-10-CM

## 2019-07-25 DIAGNOSIS — D696 Thrombocytopenia, unspecified: Secondary | ICD-10-CM | POA: Diagnosis present

## 2019-07-25 DIAGNOSIS — F10939 Alcohol use, unspecified with withdrawal, unspecified: Secondary | ICD-10-CM | POA: Diagnosis present

## 2019-07-25 DIAGNOSIS — R7401 Elevation of levels of liver transaminase levels: Secondary | ICD-10-CM | POA: Diagnosis present

## 2019-07-25 DIAGNOSIS — F332 Major depressive disorder, recurrent severe without psychotic features: Secondary | ICD-10-CM

## 2019-07-25 DIAGNOSIS — F10230 Alcohol dependence with withdrawal, uncomplicated: Secondary | ICD-10-CM

## 2019-07-25 DIAGNOSIS — F17210 Nicotine dependence, cigarettes, uncomplicated: Secondary | ICD-10-CM

## 2019-07-25 DIAGNOSIS — F10129 Alcohol abuse with intoxication, unspecified: Secondary | ICD-10-CM

## 2019-07-25 DIAGNOSIS — R740 Nonspecific elevation of levels of transaminase and lactic acid dehydrogenase [LDH]: Secondary | ICD-10-CM

## 2019-07-25 LAB — RAPID URINE DRUG SCREEN, HOSP PERFORMED
Amphetamines: NOT DETECTED
Barbiturates: NOT DETECTED
Benzodiazepines: POSITIVE — AB
Cocaine: NOT DETECTED
Opiates: NOT DETECTED
Tetrahydrocannabinol: NOT DETECTED

## 2019-07-25 LAB — SARS CORONAVIRUS 2 (TAT 6-24 HRS): SARS Coronavirus 2: NEGATIVE

## 2019-07-25 LAB — TROPONIN I (HIGH SENSITIVITY): Troponin I (High Sensitivity): 14 ng/L (ref <18)

## 2019-07-25 MED ORDER — LORAZEPAM 2 MG/ML IJ SOLN: 0.0000 mg | Freq: Four times a day (QID) | INTRAMUSCULAR | Status: DC

## 2019-07-25 MED ORDER — LORAZEPAM 1 MG PO TABS: 0.0000 mg | ORAL_TABLET | Freq: Four times a day (QID) | ORAL | Status: DC

## 2019-07-25 MED ORDER — LORAZEPAM 1 MG PO TABS: 1.0000 mg | ORAL_TABLET | ORAL | Status: DC | PRN

## 2019-07-25 MED ORDER — LORAZEPAM 2 MG/ML IJ SOLN
0.0000 mg | Freq: Four times a day (QID) | INTRAMUSCULAR | Status: AC
  Administered 2019-07-26 (×2): 2 mg via INTRAVENOUS
  Filled 2019-07-25 (×2): qty 1

## 2019-07-25 MED ORDER — PANTOPRAZOLE SODIUM 40 MG IV SOLR
40.0000 mg | Freq: Once | INTRAVENOUS | Status: AC
  Administered 2019-07-25: 40 mg via INTRAVENOUS
  Filled 2019-07-25: qty 40

## 2019-07-25 MED ORDER — LORAZEPAM 1 MG PO TABS
0.0000 mg | ORAL_TABLET | Freq: Four times a day (QID) | ORAL | Status: AC
  Administered 2019-07-25: 1 mg via ORAL
  Administered 2019-07-26: 2 mg via ORAL
  Filled 2019-07-25: qty 1
  Filled 2019-07-25: qty 2

## 2019-07-25 MED ORDER — SODIUM CHLORIDE 0.9 % IV BOLUS
1000.0000 mL | Freq: Once | INTRAVENOUS | Status: AC
  Administered 2019-07-25: 1000 mL via INTRAVENOUS

## 2019-07-25 MED ORDER — LORAZEPAM 2 MG/ML IJ SOLN: 1.0000 mg | Freq: Once | INTRAMUSCULAR | Status: DC

## 2019-07-25 MED ORDER — FOLIC ACID 1 MG PO TABS
1.0000 mg | ORAL_TABLET | Freq: Every day | ORAL | Status: DC
  Administered 2019-07-25 – 2019-07-30 (×6): 1 mg via ORAL
  Filled 2019-07-25 (×6): qty 1

## 2019-07-25 MED ORDER — LORAZEPAM 1 MG PO TABS
1.0000 mg | ORAL_TABLET | ORAL | Status: DC | PRN
  Administered 2019-07-26 – 2019-07-27 (×3): 1 mg via ORAL
  Filled 2019-07-25 (×3): qty 1

## 2019-07-25 MED ORDER — SODIUM CHLORIDE 0.9% FLUSH
3.0000 mL | Freq: Two times a day (BID) | INTRAVENOUS | Status: DC
  Administered 2019-07-25 – 2019-07-28 (×5): 3 mL via INTRAVENOUS
  Administered 2019-07-30: 10 mL via INTRAVENOUS

## 2019-07-25 MED ORDER — SODIUM CHLORIDE 0.9 % IV SOLN
INTRAVENOUS | Status: DC
  Administered 2019-07-25 – 2019-07-26 (×3): via INTRAVENOUS

## 2019-07-25 MED ORDER — ACETAMINOPHEN 325 MG PO TABS
650.0000 mg | ORAL_TABLET | Freq: Four times a day (QID) | ORAL | Status: DC | PRN
  Administered 2019-07-25 – 2019-07-30 (×2): 650 mg via ORAL
  Filled 2019-07-25 (×3): qty 2

## 2019-07-25 MED ORDER — LORAZEPAM 1 MG PO TABS
0.0000 mg | ORAL_TABLET | Freq: Two times a day (BID) | ORAL | Status: DC
  Administered 2019-07-27 (×2): 1 mg via ORAL
  Filled 2019-07-25: qty 2

## 2019-07-25 MED ORDER — ONDANSETRON HCL 4 MG PO TABS
4.0000 mg | ORAL_TABLET | Freq: Four times a day (QID) | ORAL | Status: DC | PRN
  Administered 2019-07-26: 4 mg via ORAL
  Filled 2019-07-25 (×2): qty 1

## 2019-07-25 MED ORDER — ONDANSETRON HCL 4 MG/2ML IJ SOLN
4.0000 mg | Freq: Four times a day (QID) | INTRAMUSCULAR | Status: DC | PRN
  Administered 2019-07-25 – 2019-07-27 (×2): 4 mg via INTRAVENOUS
  Filled 2019-07-25 (×2): qty 2

## 2019-07-25 MED ORDER — PANTOPRAZOLE SODIUM 40 MG IV SOLR
40.0000 mg | Freq: Two times a day (BID) | INTRAVENOUS | Status: DC
  Administered 2019-07-25 – 2019-07-28 (×8): 40 mg via INTRAVENOUS
  Filled 2019-07-25 (×8): qty 40

## 2019-07-25 MED ORDER — LORAZEPAM 2 MG/ML IJ SOLN: 1.0000 mg | INTRAMUSCULAR | Status: DC | PRN

## 2019-07-25 MED ORDER — ACETAMINOPHEN 650 MG RE SUPP: 650.0000 mg | Freq: Four times a day (QID) | RECTAL | Status: DC | PRN

## 2019-07-25 MED ORDER — NICOTINE 21 MG/24HR TD PT24
21.0000 mg | MEDICATED_PATCH | Freq: Every day | TRANSDERMAL | Status: DC
  Administered 2019-07-26 – 2019-07-30 (×5): 21 mg via TRANSDERMAL
  Filled 2019-07-25 (×5): qty 1

## 2019-07-25 MED ORDER — LORAZEPAM 2 MG/ML IJ SOLN
1.0000 mg | INTRAMUSCULAR | Status: DC | PRN
  Administered 2019-07-25: 4 mg via INTRAVENOUS

## 2019-07-25 MED ORDER — PHENOBARBITAL SODIUM 130 MG/ML IJ SOLN
260.0000 mg | Freq: Once | INTRAMUSCULAR | Status: AC
  Administered 2019-07-25: 260 mg via INTRAVENOUS
  Filled 2019-07-25: qty 2

## 2019-07-25 MED ORDER — ONDANSETRON HCL 4 MG/2ML IJ SOLN
4.0000 mg | Freq: Once | INTRAMUSCULAR | Status: AC
  Administered 2019-07-25: 4 mg via INTRAVENOUS
  Filled 2019-07-25: qty 2

## 2019-07-25 MED ORDER — ADULT MULTIVITAMIN W/MINERALS CH
1.0000 | ORAL_TABLET | Freq: Every day | ORAL | Status: DC
  Administered 2019-07-25 – 2019-07-30 (×6): 1 via ORAL
  Filled 2019-07-25 (×6): qty 1

## 2019-07-25 MED ORDER — ALBUTEROL SULFATE (2.5 MG/3ML) 0.083% IN NEBU: 2.5000 mg | INHALATION_SOLUTION | Freq: Four times a day (QID) | RESPIRATORY_TRACT | Status: DC | PRN

## 2019-07-25 MED ORDER — LORAZEPAM 2 MG/ML IJ SOLN
1.0000 mg | INTRAMUSCULAR | Status: DC | PRN
  Administered 2019-07-26: 1 mg via INTRAVENOUS
  Administered 2019-07-27 (×2): 2 mg via INTRAVENOUS
  Filled 2019-07-25 (×3): qty 1

## 2019-07-25 NOTE — ED Notes (Signed)
Patient continues to shake and is sometimes seeing things and thinks he is at home, able to reorient him at this time.  Continues to have moments of sweating, headaches and nausea.  Notified provider

## 2019-07-25 NOTE — ED Notes (Signed)
Pt able to eat lunch without any noted nausea.   Remains calm and talkative, cooperative with Covid swab.

## 2019-07-25 NOTE — H&P (Addendum)
History and Physical    Gary [name] ZOX:[id number] DOB: 02/27/1986 DOA: 07/24/2019  Referring MD/NP/PA: Chaney Mallingavid Yao, MD PCP: Patient, No Pcp Per  [facility]: [facility]  Chief Complaint: Chest pain  I have personally briefly reviewed [facility]   HPI: Gary Amesony P Pasion is a 33 y.o. male with medical history significant of HIV, SDH, tobacco abuse, and alcohol abuse; who presented yesterday evening with complaints of chest pain.  Reports drinking a lot of wine yesterday afternoon prior to coming to the hospital. Complained of substernal chest pain that was severe.  Associated symptoms include nausea, vomiting, abdominal pain, and diarrhea.  He states that he has 3 or more sick day.  Records show that patient had made statements of wanting to commit suicide by alcohol.  At this time he reports that he does not want to harm himself.  Normally drinks wine and vodka on a daily basis and has no willing quitting at this time.  He has not followed-up with infectious disease. En route with EMS patient was noted to be aggressive and was given 2.5 mg of IV Versed.  Last admitted into the hospital from 8/22-8/24 traumatic subdural hematoma with acute alcohol intoxication.  Repeat CT scans were noted to show a stable subdural hematoma, and the patient was able to be discharged home.  ED Course: In addition to the emergency department patient was seen to be afebrile, pulse 63-128, and all other vital signs maintained.  Labs significant for negative troponins x2, platelet count 83, AST 150, ALT 98, and alcohol level 506.  EKG showed no ischemic changes.  UDS was positive for benzodiazepines after he had been in the emergency department. CWIA calls were initiated and CWIA score was noted to be 21. TRH called to admit for alcohol withdrawals.  Review of Systems  Constitutional: Positive for malaise/fatigue.  HENT: Negative for ear discharge and tinnitus.     Eyes: Negative for pain and discharge.  Respiratory: Negative for cough and shortness of breath.   Cardiovascular: Positive for chest pain. Negative for leg swelling.  Gastrointestinal: Positive for abdominal pain, diarrhea, nausea and vomiting.  Genitourinary: Negative for dysuria and hematuria.  Musculoskeletal: Negative for falls.  Neurological: Positive for tremors. Negative for loss of consciousness.  Psychiatric/Behavioral: Positive for substance abuse. Negative for suicidal ideas.    Past Medical History:  Diagnosis Date   Alcohol abuse    HIV (human immunodeficiency virus infection) (HCC)    Subdural hematoma (HCC)     Past Surgical History:  Procedure Laterality Date   INCISION AND DRAINAGE PERIRECTAL ABSCESS N/A 09/24/2016   Procedure: IRRIGATION AND DEBRIDEMENT PERIRECTAL ABSCESS;  Surgeon: Ricarda Frameharles Woodham, MD;  Location: [facility] ORS;  Service: General;  Laterality: N/A;   none       reports that he has been smoking cigarettes. He has been smoking about 0.50 packs per day. He has never used smokeless tobacco. He reports current alcohol use of about 3.0 standard drinks of alcohol per week. He reports current drug use. Drugs: Marijuana and Methamphetamines.  Allergies  Allergen Reactions   Caffeine Palpitations    Family History  Problem Relation Age of Onset   Alcohol abuse Mother    Alcohol abuse Father     Prior to Admission medications   Medication Sig Start Date End Date Taking? Authorizing Provider  oxyCODONE-acetaminophen (PERCOCET) 5-325 MG tablet Take 1 tablet by mouth every 4 (four) hours as needed for severe  pain. Patient not taking: Reported on 07/17/2019 07/03/19   Radene Gunning, NP  famotidine (PEPCID) 20 MG tablet Take 1 tablet (20 mg total) by mouth 2 (two) times daily. Patient not taking: Reported on 06/01/2019 05/14/19 06/02/19  Davonna Belling, MD    Physical Exam:  Constitutional: Middle-aged male appears disheveled and still  intoxicated Vitals:   07/25/19 0754 07/25/19 0800 07/25/19 0830 07/25/19 0900  BP: (!) 127/100 123/89 122/86 118/80  Pulse: (!) 102 92 79 75  Resp:  (!) 21 13 16   Temp:      TempSrc:      SpO2:  99% 99% 96%  Weight:      Height:       Eyes: PERRL, lids and conjunctivae normal ENMT: Mucous membranes are dry. Posterior pharynx clear of any exudate or lesions.  Neck: normal, supple, no masses, no thyromegaly Respiratory: clear to auscultation bilaterally, no wheezing, no crackles. Normal respiratory effort. No accessory muscle use.  Cardiovascular: Tachycardic, no murmurs / rubs / gallops. No extremity edema. 2+ pedal pulses. No carotid bruits.  Abdomen: no tenderness or fluid wave, no masses palpated. No hepatosplenomegaly. Bowel sounds positive.  Musculoskeletal: no clubbing / cyanosis. No joint deformity upper and lower extremities. Good ROM, no contractures. Normal muscle tone.  Skin: Healed head laceration previous fall Neurologic: CN 2-12 grossly intact.  Tremor present.  Strength 5/5 in all 4.   Psychiatric: Poor judgment and insight.  Alert and oriented x 3.  Depressed mood.     Labs on Admission: I have personally reviewed following labs and imaging studies  CBC: Recent Labs  Lab 07/24/19 2240  WBC 4.1  NEUTROABS 2.2  HGB 13.9  HCT 41.1  MCV 98.6  PLT 83*   Basic Metabolic Panel: Recent Labs  Lab 07/24/19 2240  NA 140  K 3.6  CL 106  CO2 22  GLUCOSE 137*  BUN 5*  CREATININE 0.67  CALCIUM 8.3*   GFR: Estimated Creatinine Clearance: 127.5 mL/min (by C-G formula based on SCr of 0.67 mg/dL). Liver Function Tests: Recent Labs  Lab 07/24/19 2240  AST 150*  ALT 98*  ALKPHOS 83  BILITOT 0.7  PROT 7.0  ALBUMIN 3.6   No results for input(s): LIPASE, AMYLASE in the last 168 hours. No results for input(s): AMMONIA in the last 168 hours. Coagulation Profile: No results for input(s): INR, PROTIME in the last 168 hours. Cardiac Enzymes: No results for  input(s): CKTOTAL, CKMB, CKMBINDEX, TROPONINI in the last 168 hours. BNP (last 3 results) No results for input(s): PROBNP in the last 8760 hours. HbA1C: No results for input(s): HGBA1C in the last 72 hours. CBG: No results for input(s): GLUCAP in the last 168 hours. Lipid Profile: No results for input(s): CHOL, HDL, LDLCALC, TRIG, CHOLHDL, LDLDIRECT in the last 72 hours. Thyroid Function Tests: No results for input(s): TSH, T4TOTAL, FREET4, T3FREE, THYROIDAB in the last 72 hours. Anemia Panel: No results for input(s): VITAMINB12, FOLATE, FERRITIN, TIBC, IRON, RETICCTPCT in the last 72 hours. Urine analysis:    Component Value Date/Time   COLORURINE YELLOW (A) 03/31/2019 0228   APPEARANCEUR HAZY (A) 03/31/2019 0228   APPEARANCEUR Clear 11/15/2014 1755   LABSPEC 1.017 03/31/2019 0228   LABSPEC 1.002 11/15/2014 1755   PHURINE 6.0 03/31/2019 0228   GLUCOSEU NEGATIVE 03/31/2019 0228   GLUCOSEU Negative 11/15/2014 1755   HGBUR NEGATIVE 03/31/2019 0228   BILIRUBINUR NEGATIVE 03/31/2019 0228   BILIRUBINUR Negative 11/15/2014 [place] 03/31/2019 0228  PROTEINUR NEGATIVE 03/31/2019 0228   NITRITE NEGATIVE 03/31/2019 0228   LEUKOCYTESUR NEGATIVE 03/31/2019 0228   LEUKOCYTESUR Negative 11/15/2014 1755   Sepsis Labs: No results found for this or any previous visit (from the past 240 hour(s)).   Radiological Exams on Admission: No results found.  EKG: Independently reviewed.  Sinus rhythm at 93 bpm  Assessment/Plan Alcohol abuse with intoxication and withdrawal: Acute. Upon admission into the emergency department his alcohol level was noted to be elevated at 506.  Physical exam patient currently tremulous and appears to be acute withdrawals.  He denies any level of quitting at this time. -Admit to a progressive bed -CWIA stepdown protocols with Ativan every hour if needed -May require additional doses of Ativan IV -Continue to counsel on need of cessation of alcohol  use -Social work consult for alcohol abuse  Chest pain: Acute.  Patient planes of chest pain.  EKG showing no significant ischemic changes and troponins negative x2.  Suspect symptoms could be secondary to acid reflux with nausea and vomiting. -Follow-up telemetry  Nausea and vomiting: Acute. -Continue PPI -Antiemetics as needed   Diarrhea: Acute.  Patient reports having 3 bowel movements per day.  Given patient's history of HIV not currently on treatment question underlying infection. -Contact precaution -Strict intake and output -May warrant checking stool studies if symptoms persist  Depression:Acute on chronic. Patient appears to be depressed and had reportedly made statements of trying to harm himself.  At this time patient denies any thoughts of wanting herself. -Sitter to bedside -May warrant psych/TTS evaluation once medically stable  Thrombocytopenia: Chronic.  Platelet count 83 on admission.  Suspect secondary to patient's history of investigation.  Patient denies any reports of bleeding. -Recheck CBC in a.m.  Transaminitis: Acute on chronic.  AST 150 and ALT 98.  AST to ALT ratio consistent with  -Follow-up repeat CMP  HIV: Patient currently is not receiving treatment and has not followed up with infectious disease. Labs from 05/16/2019 [facility] on care everywhere revealed HIV RNA quantitation 16,700, percent T helper cells 84.2, total T lymphocytes 0.59 T-helper leukocytes 0.24. -Recommend outpatient follow-up with ID  Tobacco abuse -Nicotine patch offered  COVID-19 screening: Pending  DVT prophylaxis: SCDs Code Status: Full Family Communication: Family present bedside Disposition Plan: Possible discharge home in 2-3 days Consults called: Social work Admission status: inpatient   Clydie Braun MD [facility] Pager [phone]   If 7PM-7AM, please contact night-coverage [email or web address] Password [facility]  07/25/2019, 9:24 AM

## 2019-07-25 NOTE — ED Notes (Signed)
Lunch Tray Ordered @ 1044.  

## 2019-07-25 NOTE — Consult Note (Signed)
NAME:  Gary [name], MRN:  [id number], DOB:  06/26/1986, LOS: 0 ADMISSION DATE:  07/24/2019, CONSULTATION DATE:  07/25/19 REFERRING MD:  Katrinka BlazingSmith  CHIEF COMPLAINT:  EtOH Withdrawal   Brief History   Gary [name] is a 33 y.o. male who was admitted 9/15 with EtOH withdrawal.  PCCM asked to see in consultation for consideration precedex.  History of present illness   Gary [name] is a 33 y.o. male who has a PMH including but not limited to HIV (not currently on treatment), SDH, EtOH abuse, tobacco abuse, traumatic SDH 2/2 EtOH intoxication.  [facility] with chest pain after drinking a lot of wine the day prior.  He also had associated nausea, vomiting, abdominal pain, diarrhea.  He apparently has hx of SI by means of EtOH in the past; however, currently denies any thoughts of self harm or SI.  He states that he normally drinks wine and vodka every day, last being 9/14 when he drank "a lot of wine".    In ED, he was found to have EtOH withdrawal with CIWA in 20 - 30 range.  He was started on CIWA protocol with ativan and PCCM was asked to see in consultation for consideration of precedex.  At the time of our evaluation, pt had received a total of 10mg  ativan over the past 7.5 hours.  He is currently sitting up in his ED stretcher eating dinner.  He has no complaints and states that he does feel "a tad bit better".  Currently denies ongoing chest pain.   Past Medical History  HIV (not currently on treatment), SDH, EtOH abuse, tobacco abuse, traumatic SDH 2/2 EtOH intoxication.  Significant Hospital Events   9/15 > admit.  Consults:  PCCM.  Procedures:  None.  Significant Diagnostic Tests:  None.  Micro Data:  SARS CoV2 9/15 > negative. GI panel 9/15 >   Antimicrobials:  None.   Interim history/subjective:  No complaints.  Sitting up in stretcher eating dinner.  Objective:  Blood pressure 131/89, pulse (!) 106, temperature 98.8 F (37.1 C), temperature source  Oral, resp. rate (!) 22, height 6\' 1"  (1.854 m), weight 68 kg, SpO2 99 %.        Intake/Output Summary (Last 24 hours) at 07/25/2019 1946 Last data filed at 07/25/2019 1830 Gross per 24 hour  Intake 1170 ml  Output --  Net 1170 ml   Filed Weights   07/24/19 2245  Weight: 68 kg    Examination: General: Young adult male, in NAD. Neuro: A&O x 3, no deficits.  + tremor and asterixis noted. HEENT: [place]/AT. Sclerae anicteric.  EOMI. Cardiovascular: Tachy, regular, no M/R/G.  Lungs: Respirations even and unlabored.  CTA bilaterally, No W/R/R.  Abdomen: BS x 4, soft, NT/ND.  Musculoskeletal: No gross deformities, no edema.  Skin: Intact, warm, no rashes.  Assessment & Plan:   EtOH withdrawal with DT's - initial EtOH level elevated at 506 and started on CIWA protocol by TRH. - Stable for SDU admission under TRH at this time. - Will give one time dose of 260mg  phenobarbital. - If has persistent symptoms, can consider repeat dosing at 130mg  as needed. - Continue PRN ativan, though at lower doses (orders adjusted to reflect lower dosing). - Continue thiamine / folate. - EtOH cessation counseling.  Nausea, vomiting, diarrhea - controlled at this time. - Continue zofran. - Change PPI to famotidine incase of C.diff (unlikely given no recent abx use).  HIV - not  currently on treatment or being followed. - F/u as an outpatient.   Rest per primary team.  Nothing further to add.  PCCM will sign off.  Please do not hesitate to call us back if we can be of any further assistance or if pt were to require higher level of care if not controlled on above regimen.  Best Practice:  Diet: Regular. Pain/Anxiety/Delirium protocol (if indicated): N/A. VAP protocol (if indicated): N/A. DVT prophylaxis: SCD's. GI prophylaxis: Famotidine. Glucose control: SSI if glucose consistently > 180. Mobility: Bedrest. Code Status: Full. Family Communication: None available. Disposition: SDU.  Labs    CBC: Recent Labs  Lab 07/24/19 2240  WBC 4.1  NEUTROABS 2.2  HGB 13.9  HCT 41.1  MCV 98.6  PLT 83*   Basic Metabolic Panel: Recent Labs  Lab 07/24/19 2240  NA 140  K 3.6  CL 106  CO2 22  GLUCOSE 137*  BUN 5*  CREATININE 0.67  CALCIUM 8.3*   GFR: Estimated Creatinine Clearance: 127.5 mL/min (by C-G formula based on SCr of 0.67 mg/dL). Recent Labs  Lab 07/24/19 2240  WBC 4.1   Liver Function Tests: Recent Labs  Lab 07/24/19 2240  AST 150*  ALT 98*  ALKPHOS 83  BILITOT 0.7  PROT 7.0  ALBUMIN 3.6   No results for input(s): LIPASE, AMYLASE in the last 168 hours. No results for input(s): AMMONIA in the last 168 hours. ABG No results found for: PHART, PCO2ART, PO2ART, HCO3, TCO2, ACIDBASEDEF, O2SAT  Coagulation Profile: No results for input(s): INR, PROTIME in the last 168 hours. Cardiac Enzymes: No results for input(s): CKTOTAL, CKMB, CKMBINDEX, TROPONINI in the last 168 hours. HbA1C: Hgb A1c MFr Bld  Date/Time Value Ref Range Status  09/24/2016 04:54 AM 5.3 4.8 - 5.6 % Final    Comment:    (NOTE)         Pre-diabetes: 5.7 - 6.4         Diabetes: >6.4         Glycemic control for adults with diabetes: <7.0    CBG: No results for input(s): GLUCAP in the last 168 hours.  Review of Systems:   All negative; except for those that are bolded, which indicate positives.  Constitutional: weight loss, weight gain, night sweats, fevers, chills, fatigue, weakness.  HEENT: headaches, sore throat, sneezing, nasal congestion, post nasal drip, difficulty swallowing, tooth/dental problems, visual complaints, visual changes, ear aches. Neuro: difficulty with speech, weakness, numbness, ataxia. CV:  chest pain (currently resolved), orthopnea, PND, swelling in lower extremities, dizziness, palpitations, syncope.  Resp: cough, hemoptysis, dyspnea, wheezing. GI: heartburn, indigestion, abdominal pain, nausea, vomiting, diarrhea, constipation, change in bowel habits, loss  of appetite, hematemesis, melena, hematochezia.  GU: dysuria, change in color of urine, urgency or frequency, flank pain, hematuria. MSK: joint pain or swelling, decreased range of motion. Psych: change in mood or affect, depression, anxiety, suicidal ideations, homicidal ideations. Skin: rash, itching, bruising.  Past medical history  He,  has a past medical history of Alcohol abuse, HIV (human immunodeficiency virus infection) ([place]), and Subdural hematoma ([place]).   Surgical History    Past Surgical History:  Procedure Laterality Date   INCISION AND DRAINAGE PERIRECTAL ABSCESS N/A 09/24/2016   Procedure: IRRIGATION AND DEBRIDEMENT PERIRECTAL ABSCESS;  Surgeon: Clayburn Pert, MD;  Location: [facility] ORS;  Service: General;  Laterality: N/A;   none       Social History   reports that he has been smoking cigarettes. He has been smoking about 0.50 packs  per day. He has never used smokeless tobacco. He reports current alcohol use of about 3.0 standard drinks of alcohol per week. He reports current drug use. Drugs: Marijuana and Methamphetamines.   Family history   His family history includes Alcohol abuse in his father and mother.   Allergies Allergies  Allergen Reactions   Caffeine Palpitations     Home meds  Prior to Admission medications   Medication Sig Start Date End Date Taking? Authorizing Provider  oxyCODONE-acetaminophen (PERCOCET) 5-325 MG tablet Take 1 tablet by mouth every 4 (four) hours as needed for severe pain. Patient not taking: Reported on 07/17/2019 07/03/19   Gwenyth Bender, NP  famotidine (PEPCID) 20 MG tablet Take 1 tablet (20 mg total) by mouth 2 (two) times daily. Patient not taking: Reported on 06/01/2019 05/14/19 06/02/19  Benjiman Core, MD     Rutherford Guys, PA - [facility] Pager: [phone].  If no answer, [phone] 07/25/2019, 7:46 PM

## 2019-07-25 NOTE — ED Provider Notes (Signed)
  Physical Exam  BP (!) 127/100   Pulse (!) 102   Temp 98.8 F (37.1 C) (Oral)   Resp 16   Ht 6\' 1"  (1.854 m)   Wt 68 kg   SpO2 96%   BMI 19.79 kg/m   Physical Exam  ED Course/Procedures     Procedures  MDM  Patient care assumed at 7 am. Patient hx of alcohol use here with alcohol intoxication. Patient was initially combative. Patient's ETOH was 500 initially. Patient care assumed to reassess patient   9:07 AM Patient more sober now. He is feeling tremulous. His CIWA is 21. He is high risk for alcohol withdrawal. Initially called for unassigned medicine but he was just on the hospitalist service so hospitalist to admit for alcohol withdrawal.      Drenda Freeze, MD 07/25/19 [phone]

## 2019-07-25 NOTE — Progress Notes (Signed)
Consulted PCCM Ernest Mallick, DO to evaluate the patient due to elevated CWIA scores and possible need of ICU admission and Precedex drip for alcohol withdrawals.Marland Kitchen

## 2019-07-25 NOTE — ED Notes (Signed)
Called pharm for a consult on phenobarbital. Give over 3 minutes.

## 2019-07-25 NOTE — ED Notes (Signed)
Dinner tray ordered.

## 2019-07-25 NOTE — ED Notes (Signed)
Pt alert and talkative and able to speak full sentences.  Calm and cooperative with nurse, able to talk about getting sober and he is receptive.  Able to talk about positive and how he wants to learn from his mistakes.

## 2019-07-25 NOTE — ED Notes (Signed)
Pt sleeping. 

## 2019-07-25 NOTE — ED Notes (Signed)
Pt requesting Ativan, informed RN of pt request

## 2019-07-25 NOTE — ED Notes (Signed)
Pt up to BR with sitter present.  Able to collect stool sample at this time.

## 2019-07-25 NOTE — ED Notes (Signed)
Per EDP, IV Ativan not given. Wasted remaining 1mg  in sharps with Suezanne Jacquet, Therapist, sports.

## 2019-07-26 LAB — COMPREHENSIVE METABOLIC PANEL
ALT: 80 U/L — ABNORMAL HIGH (ref 0–44)
AST: 103 U/L — ABNORMAL HIGH (ref 15–41)
Albumin: 3.3 g/dL — ABNORMAL LOW (ref 3.5–5.0)
Alkaline Phosphatase: 77 U/L (ref 38–126)
Anion gap: 9 (ref 5–15)
BUN: <5 mg/dL — ABNORMAL LOW (ref 6–20)
CO2: 24 mmol/L (ref 22–32)
Calcium: 8.8 mg/dL — ABNORMAL LOW (ref 8.9–10.3)
Chloride: 102 mmol/L (ref 98–111)
Creatinine, Ser: 0.64 mg/dL (ref 0.61–1.24)
GFR calc Af Amer: >60 mL/min (ref >60)
GFR calc non Af Amer: >60 mL/min (ref >60)
Glucose, Bld: 80 mg/dL (ref 70–99)
Potassium: 3.6 mmol/L (ref 3.5–5.1)
Sodium: 135 mmol/L (ref 135–145)
Total Bilirubin: 1.2 mg/dL (ref 0.3–1.2)
Total Protein: 6.4 g/dL — ABNORMAL LOW (ref 6.5–8.1)

## 2019-07-26 LAB — GASTROINTESTINAL PANEL BY PCR, STOOL (REPLACES STOOL CULTURE)

## 2019-07-26 LAB — CBC
HCT: 37.9 % — ABNORMAL LOW (ref 39.0–52.0)
Hemoglobin: 12.8 g/dL — ABNORMAL LOW (ref 13.0–17.0)
MCH: 33.8 pg (ref 26.0–34.0)
MCHC: 33.8 g/dL (ref 30.0–36.0)
MCV: 100 fL (ref 80.0–100.0)
Platelets: 57 10*3/uL — ABNORMAL LOW (ref 150–400)
RBC: 3.79 MIL/uL — ABNORMAL LOW (ref 4.22–5.81)
RDW: 14.3 % (ref 11.5–15.5)
WBC: 2.8 10*3/uL — ABNORMAL LOW (ref 4.0–10.5)
nRBC: 0 % (ref 0.0–0.2)

## 2019-07-26 LAB — PHOSPHORUS: Phosphorus: 3.5 mg/dL (ref 2.5–4.6)

## 2019-07-26 LAB — MAGNESIUM: Magnesium: 1.5 mg/dL — ABNORMAL LOW (ref 1.7–2.4)

## 2019-07-26 MED ORDER — CIPROFLOXACIN IN D5W 200 MG/100ML IV SOLN
200.0000 mg | Freq: Two times a day (BID) | INTRAVENOUS | Status: DC
  Administered 2019-07-26 – 2019-07-28 (×4): 200 mg via INTRAVENOUS
  Filled 2019-07-26 (×4): qty 100

## 2019-07-26 MED ORDER — CIPROFLOXACIN HCL 500 MG PO TABS: 500.0000 mg | ORAL_TABLET | Freq: Two times a day (BID) | ORAL | Status: DC

## 2019-07-26 MED ORDER — MAGNESIUM SULFATE 2 GM/50ML IV SOLN
2.0000 g | Freq: Once | INTRAVENOUS | Status: AC
  Administered 2019-07-26: 2 g via INTRAVENOUS
  Filled 2019-07-26: qty 50

## 2019-07-26 NOTE — ED Notes (Signed)
Pt attempting to get up- can barely stand. HR shot up to 150s. RN redirected him to sit down. Sitter remains at bedside.

## 2019-07-26 NOTE — Progress Notes (Signed)
Pt arrived to 3W18. Alert and oriented x4, delayed responses, follows commands intermittently. Can be redirected by staff for safety. Necrotic wounds noted on multiple toes and bottoms of feet, bilaterally. Pt states they are from walking a lot. Telemetry verified, sitter at bedside.

## 2019-07-26 NOTE — Progress Notes (Addendum)
PROGRESS NOTE    Gary [name]  ZOX:[id number] DOB: 1985-12-27 DOA: 07/24/2019 PCP: Patient, No Pcp Per   Brief Narrative:  Patient is a 33 year old male with history of HIV, currently not on treatment, tobacco abuse, alcohol abuse, traumatic subdural hematoma after acute alcohol intoxication who presents to the emergency department complains of chest pain, nausea, vomiting, abdominal pain, diarrhea.  Patient was found to be in acute alcohol withdrawal on presentation with high CIWA score.  Admitted for further management.  Assessment & Plan:   Principal Problem:   Alcohol withdrawal (HCC) Active Problems:   HIV disease (HCC)   Cigarette smoker   Major depressive disorder, recurrent severe without psychotic features (HCC)   Alcohol abuse with intoxication (HCC)   Thrombocytopenia (HCC)   Transaminitis   Diarrhea   Alcohol abuse with intoxication/withdrawal: Alcohol level at 506 on presentation.  Found to be tremulous, anxious.  Started on CIWA protocol.  Started on thiamine and folic acid. He says he drinks 5 bottles of wine a day.  Has long history of alcohol abuse.  Counseled for alcohol cessation. PCCM was also consulted during admission who recommended IV phenobarbital.  Diarrhea: GI pathogen panel showed enteroaggregative of E. coli.  Diarrhea has improved.  Started on  ciprofloxacin because of his immunocompromised status.Can change oral antibiotic  when he can tolerate.5 days course is enough.  Chest pain: Does not complain of any chest pain during my evaluation.  EKG did not show any significant ischemic changes.  Troponins negative.  No further work-up initiated  Nausea/vomiting: Continue PPI, antiemetics  History of depression: Not on any treatment at present.  Denies any suicidal thoughts.  I will request psychiatry consultation for history of depression and concurrent alcohol abuse.  Elevated liver enzymes/thrombocytopenia/leucopenia:   Associated with chronic  alcohol abuse.  Continue to monitor CBC  HIV: Not on any treatment due to noncompliance.Labs from 05/16/2019 [facility] care everywhere revealed HIV RNA quantitation 16,700, percent T helper cells 84.2, total T lymphocytes 0.59 T-helper leukocytes 0.24.Recommend outpatient follow-up with ID  Tobacco abuse: Nicotine patch           DVT prophylaxis: SCD Code Status: Full Family Communication: None present at the bedside Disposition Plan: Likely back to home in 1 to 2 days after resolution of alcohol withdrawal.   Consultants: PCCM  Procedures: None  Antimicrobials:  Anti-infectives (From admission, onward)   Start     Dose/Rate Route Frequency Ordered Stop   07/26/19 2000  ciprofloxacin (CIPRO) tablet 500 mg     500 mg Oral 2 times daily 07/26/19 1522 07/31/19 1959      Subjective:  Patient seen and examined the bedside this morning in the emergency department.  .  Found to be confused, sitting on the chair outside the room.  Appears intoxicated with alcohol.he denied any chest pain or abdominal pain during my evaluation.  Objective: Vitals:   07/26/19 0900 07/26/19 0945 07/26/19 1408 07/26/19 1456  BP: (!) 130/91  126/79 128/84  Pulse: 91  (!) 102 95  Resp: 18 (!) 21 18 17   Temp: 98.7 F (37.1 C)     TempSrc: Oral     SpO2: 100%  100% 99%  Weight:      Height:        Intake/Output Summary (Last 24 hours) at 07/26/2019 1523 Last data filed at 07/25/2019 1830 Gross per 24 hour  Intake 240 ml  Output -  Net 240 ml   Filed Weights   07/24/19 2245  Weight: 68 kg    Examination:  General exam: Not in distress,average built,intoxicated with alcohol HEENT:Oral mucosa moist, Ear/Nose normal on gross exam Respiratory system: Bilateral equal air entry, normal vesicular breath sounds, no wheezes or crackles  Cardiovascular system: S1 & S2 heard, RRR. No JVD, murmurs, rubs, gallops or clicks. No pedal edema. Gastrointestinal system: Abdomen is nondistended, soft and  nontender. No organomegaly or masses felt. Normal bowel sounds heard. Central nervous system: Alert and awake but orientation could not be checked due to his patient's intoxication,mental state .He knows that he is in hospital extremities: No edema, no clubbing ,no cyanosis, distal peripheral pulses palpable. Skin: No rashes, lesions or ulcers,no icterus ,no pallor Psychiatry: Judgement and insight appear impaired   Data Reviewed: I have personally reviewed following labs and imaging studies  CBC: Recent Labs  Lab 07/24/19 2240 07/26/19 0339  WBC 4.1 2.8*  NEUTROABS 2.2  --   HGB 13.9 12.8*  HCT 41.1 37.9*  MCV 98.6 100.0  PLT 83* 57*   Basic Metabolic Panel: Recent Labs  Lab 07/24/19 2240 07/26/19 0339  NA 140 135  K 3.6 3.6  CL 106 102  CO2 22 24  GLUCOSE 137* 80  BUN 5* <5*  CREATININE 0.67 0.64  CALCIUM 8.3* 8.8*  MG  --  1.5*  PHOS  --  3.5   GFR: Estimated Creatinine Clearance: 127.5 mL/min (by C-G formula based on SCr of 0.64 mg/dL). Liver Function Tests: Recent Labs  Lab 07/24/19 2240 07/26/19 0339  AST 150* 103*  ALT 98* 80*  ALKPHOS 83 77  BILITOT 0.7 1.2  PROT 7.0 6.4*  ALBUMIN 3.6 3.3*   No results for input(s): LIPASE, AMYLASE in the last 168 hours. No results for input(s): AMMONIA in the last 168 hours. Coagulation Profile: No results for input(s): INR, PROTIME in the last 168 hours. Cardiac Enzymes: No results for input(s): CKTOTAL, CKMB, CKMBINDEX, TROPONINI in the last 168 hours. BNP (last 3 results) No results for input(s): PROBNP in the last 8760 hours. HbA1C: No results for input(s): HGBA1C in the last 72 hours. CBG: No results for input(s): GLUCAP in the last 168 hours. Lipid Profile: No results for input(s): CHOL, HDL, LDLCALC, TRIG, CHOLHDL, LDLDIRECT in the last 72 hours. Thyroid Function Tests: No results for input(s): TSH, T4TOTAL, FREET4, T3FREE, THYROIDAB in the last 72 hours. Anemia Panel: No results for input(s):  VITAMINB12, FOLATE, FERRITIN, TIBC, IRON, RETICCTPCT in the last 72 hours. Sepsis Labs: No results for input(s): PROCALCITON, LATICACIDVEN in the last 168 hours.  Recent Results (from the past 240 hour(s))  SARS CORONAVIRUS 2 (TAT 6-24 HRS) Nasopharyngeal Nasopharyngeal Swab     Status: None   Collection Time: 07/25/19 11:35 AM   Specimen: Nasopharyngeal Swab  Result Value Ref Range Status   SARS Coronavirus 2 NEGATIVE NEGATIVE Final    Comment: (NOTE) SARS-CoV-2 target nucleic acids are NOT DETECTED. The SARS-CoV-2 RNA is generally detectable in upper and lower respiratory specimens during the acute phase of infection. Negative results do not preclude SARS-CoV-2 infection, do not rule out co-infections with other pathogens, and should not be used as the sole basis for treatment or other patient management decisions. Negative results must be combined with clinical observations, patient history, and epidemiological information. The expected result is Negative. Fact Sheet for Patients: SugarRoll.be Fact Sheet for Healthcare Providers: https://www.woods-mathews.com/ This test is not yet approved or cleared by the [place] FDA and  has been authorized for detection and/or diagnosis of SARS-CoV-2 by  FDA under an Emergency Use Authorization (EUA). This EUA will remain  in effect (meaning this test can be used) for the duration of the COVID-19 declaration under Section 56 4(b)(1) of the Act, 21 U.S.C. section 360bbb-3(b)(1), unless the authorization is terminated or revoked sooner. Performed at [facility], [address] [place], [place] [address]   Gastrointestinal Panel by PCR , Stool     Status: Abnormal   Collection Time: 07/25/19 12:40 PM   Specimen: Stool  Result Value Ref Range Status   Campylobacter species NOT DETECTED NOT DETECTED Final   Plesimonas shigelloides NOT DETECTED NOT DETECTED Final   Salmonella species NOT  DETECTED NOT DETECTED Final   Yersinia enterocolitica NOT DETECTED NOT DETECTED Final   Vibrio species NOT DETECTED NOT DETECTED Final   Vibrio cholerae NOT DETECTED NOT DETECTED Final   Enteroaggregative E coli (EAEC) DETECTED (A) NOT DETECTED Final    Comment: RESULT CALLED TO, READ BACK BY AND VERIFIED WITH: JESSICA EASLEY AT 1030 ON 07/26/2019 JJB    Enteropathogenic E coli (EPEC) NOT DETECTED NOT DETECTED Final   Enterotoxigenic E coli (ETEC) NOT DETECTED NOT DETECTED Final   Shiga like toxin producing E coli (STEC) NOT DETECTED NOT DETECTED Final   Shigella/Enteroinvasive E coli (EIEC) NOT DETECTED NOT DETECTED Final   Cryptosporidium NOT DETECTED NOT DETECTED Final   Cyclospora cayetanensis NOT DETECTED NOT DETECTED Final   Entamoeba histolytica NOT DETECTED NOT DETECTED Final   Giardia lamblia NOT DETECTED NOT DETECTED Final   Adenovirus F40/41 NOT DETECTED NOT DETECTED Final   Astrovirus NOT DETECTED NOT DETECTED Final   Norovirus GI/GII NOT DETECTED NOT DETECTED Final   Rotavirus A NOT DETECTED NOT DETECTED Final   Sapovirus (I, II, IV, and V) NOT DETECTED NOT DETECTED Final    Comment: Performed at [facility], [address] [place], [place] [address]         Radiology Studies: No results found.      Scheduled Meds: . ciprofloxacin  500 mg Oral BID  . folic acid  1 mg Oral Daily  . LORazepam  0-2 mg Oral Q6H   Or  . LORazepam  0-2 mg Intravenous Q6H  . [START ON 07/27/2019] LORazepam  0-2 mg Oral Q12H  . multivitamin with minerals  1 tablet Oral Daily  . nicotine  21 mg Transdermal Daily  . OLANZapine zydis  10 mg Oral QHS  . pantoprazole (PROTONIX) IV  40 mg Intravenous Q12H  . sodium chloride flush  3 mL Intravenous Q12H  . thiamine  100 mg Oral Daily   Or  . thiamine  100 mg Intravenous Daily   Continuous Infusions: . sodium chloride 75 mL/hr at 07/25/19 1641     LOS: 1 day    Time spent: More than 50% of that time was spent in  counseling and/or coordination of care.      Burnadette PopAmrit , MD [facility] Pager [phone]  If 7PM-7AM, please contact night-coverage [email or web address] Password [email or web address] 07/26/2019, 3:23 PM

## 2019-07-26 NOTE — ED Notes (Signed)
Report given to Cheney, RN on floor.

## 2019-07-26 NOTE — ED Notes (Signed)
Pt has no sitter since 3am and was found up out of bed twice. Pt has no awareness of safely was placed in hallway in front of nursing station so I can watch this patient.

## 2019-07-27 ENCOUNTER — Encounter (HOSPITAL_COMMUNITY): Payer: Self-pay | Admitting: Emergency Medicine

## 2019-07-27 LAB — CBC WITH DIFFERENTIAL/PLATELET
Abs Immature Granulocytes: 0.01 10*3/uL (ref 0.00–0.07)
Basophils Absolute: 0 10*3/uL (ref 0.0–0.1)
Basophils Relative: 1 %
Eosinophils Absolute: 0.1 10*3/uL (ref 0.0–0.5)
Eosinophils Relative: 4 %
HCT: 39.1 % (ref 39.0–52.0)
Hemoglobin: 13.2 g/dL (ref 13.0–17.0)
Immature Granulocytes: 0 %
Lymphocytes Relative: 24 %
Lymphs Abs: 0.7 10*3/uL (ref 0.7–4.0)
MCH: 33.2 pg (ref 26.0–34.0)
MCHC: 33.8 g/dL (ref 30.0–36.0)
MCV: 98.2 fL (ref 80.0–100.0)
Monocytes Absolute: 0.4 10*3/uL (ref 0.1–1.0)
Monocytes Relative: 12 %
Neutro Abs: 1.7 10*3/uL (ref 1.7–7.7)
Neutrophils Relative %: 59 %
Platelets: 64 10*3/uL — ABNORMAL LOW (ref 150–400)
RBC: 3.98 MIL/uL — ABNORMAL LOW (ref 4.22–5.81)
RDW: 14.2 % (ref 11.5–15.5)
WBC: 2.9 10*3/uL — ABNORMAL LOW (ref 4.0–10.5)
nRBC: 0 % (ref 0.0–0.2)

## 2019-07-27 LAB — MAGNESIUM
Magnesium: 1.9 mg/dL (ref 1.7–2.4)
Magnesium: 1.6 mg/dL — ABNORMAL LOW (ref 1.7–2.4)

## 2019-07-27 MED ORDER — LORAZEPAM 2 MG/ML IJ SOLN: 0.0000 mg | Freq: Two times a day (BID) | INTRAMUSCULAR | Status: DC

## 2019-07-27 MED ORDER — DIAZEPAM 5 MG PO TABS
5.0000 mg | ORAL_TABLET | Freq: Once | ORAL | Status: AC
  Administered 2019-07-27: 5 mg via ORAL
  Filled 2019-07-27: qty 1

## 2019-07-27 MED ORDER — LORAZEPAM 2 MG/ML IJ SOLN
1.0000 mg | INTRAMUSCULAR | Status: DC | PRN
  Administered 2019-07-27: 2 mg via INTRAVENOUS
  Filled 2019-07-27: qty 1

## 2019-07-27 MED ORDER — LORAZEPAM 1 MG PO TABS: 1.0000 mg | ORAL_TABLET | ORAL | Status: DC | PRN

## 2019-07-27 MED ORDER — DEXTROSE-NACL 5-0.45 % IV SOLN
INTRAVENOUS | Status: DC
  Administered 2019-07-27 – 2019-07-28 (×3): via INTRAVENOUS

## 2019-07-27 MED ORDER — LORAZEPAM 2 MG/ML IJ SOLN
0.0000 mg | Freq: Four times a day (QID) | INTRAMUSCULAR | Status: AC
  Administered 2019-07-27: 1 mg via INTRAVENOUS
  Administered 2019-07-28: 4 mg via INTRAVENOUS
  Administered 2019-07-28: 2 mg via INTRAVENOUS
  Filled 2019-07-27: qty 2
  Filled 2019-07-27 (×3): qty 1

## 2019-07-27 NOTE — Consult Note (Signed)
Attempted to see patient but he is currently confused in the setting of alcohol withdrawal. Please reconsult psychiatry when patient is able to meaningfully participate in interview.   Buford Dresser, DO 07/27/19 2:13 PM

## 2019-07-27 NOTE — Plan of Care (Signed)
  Problem: Education: Goal: Knowledge of General Education information will improve Description: Including pain rating scale, medication(s)/side effects and non-pharmacologic comfort measures 07/27/2019 1902 by Lennox Grumbles, RN Outcome: Progressing 07/27/2019 1901 by Lennox Grumbles, RN Outcome: Progressing   Problem: Health Behavior/Discharge Planning: Goal: Ability to manage health-related needs will improve 07/27/2019 1902 by Lennox Grumbles, RN Outcome: Progressing 07/27/2019 1901 by Lennox Grumbles, RN Outcome: Progressing   Problem: Clinical Measurements: Goal: Ability to maintain clinical measurements within normal limits will improve 07/27/2019 1902 by Lennox Grumbles, RN Outcome: Progressing 07/27/2019 1901 by Lennox Grumbles, RN Outcome: Progressing Goal: Will remain free from infection 07/27/2019 1902 by Lennox Grumbles, RN Outcome: Progressing 07/27/2019 1901 by Lennox Grumbles, RN Outcome: Progressing Goal: Diagnostic test results will improve 07/27/2019 1902 by Lennox Grumbles, RN Outcome: Progressing 07/27/2019 1901 by Lennox Grumbles, RN Outcome: Progressing Goal: Respiratory complications will improve 07/27/2019 1902 by Lennox Grumbles, RN Outcome: Progressing 07/27/2019 1901 by Lennox Grumbles, RN Outcome: Progressing Goal: Cardiovascular complication will be avoided 07/27/2019 1902 by Lennox Grumbles, RN Outcome: Progressing 07/27/2019 1901 by Lennox Grumbles, RN Outcome: Progressing   Problem: Activity: Goal: Risk for activity intolerance will decrease 07/27/2019 1902 by Lennox Grumbles, RN Outcome: Progressing 07/27/2019 1901 by Lennox Grumbles, RN Outcome: Progressing   Problem: Nutrition: Goal: Adequate nutrition will be maintained 07/27/2019 1902 by Lennox Grumbles, RN Outcome: Progressing 07/27/2019 1901 by Lennox Grumbles, RN Outcome: Progressing   Problem: Coping: Goal: Level of anxiety will decrease 07/27/2019 1902 by Lennox Grumbles, RN Outcome:  Progressing 07/27/2019 1901 by Lennox Grumbles, RN Outcome: Progressing   Problem: Elimination: Goal: Will not experience complications related to bowel motility 07/27/2019 1902 by Lennox Grumbles, RN Outcome: Progressing 07/27/2019 1901 by Lennox Grumbles, RN Outcome: Progressing Goal: Will not experience complications related to urinary retention 07/27/2019 1902 by Lennox Grumbles, RN Outcome: Progressing 07/27/2019 1901 by Lennox Grumbles, RN Outcome: Progressing   Problem: Pain Managment: Goal: General experience of comfort will improve 07/27/2019 1902 by Lennox Grumbles, RN Outcome: Progressing 07/27/2019 1901 by Lennox Grumbles, RN Outcome: Progressing   Problem: Safety: Goal: Ability to remain free from injury will improve 07/27/2019 1902 by Lennox Grumbles, RN Outcome: Progressing 07/27/2019 1901 by Lennox Grumbles, RN Outcome: Progressing   Problem: Skin Integrity: Goal: Risk for impaired skin integrity will decrease 07/27/2019 1902 by Lennox Grumbles, RN Outcome: Progressing 07/27/2019 1901 by Lennox Grumbles, RN Outcome: Progressing   Problem: Education: Goal: Knowledge of disease or condition will improve Outcome: Progressing Goal: Understanding of discharge needs will improve Outcome: Progressing   Problem: Health Behavior/Discharge Planning: Goal: Ability to identify changes in lifestyle to reduce recurrence of condition will improve Outcome: Progressing Goal: Identification of resources available to assist in meeting health care needs will improve Outcome: Progressing   Problem: Physical Regulation: Goal: Complications related to the disease process, condition or treatment will be avoided or minimized Outcome: Progressing   Problem: Safety: Goal: Ability to remain free from injury will improve Outcome: Progressing   Ival Bible, BSN, RN

## 2019-07-27 NOTE — Progress Notes (Signed)
PROGRESS NOTE    Gary [name]  ZOX:[id number] DOB: 06/08/1986 DOA: 07/24/2019 PCP: Patient, No Pcp Per   Brief Narrative:  Patient is a 33 year old male with history of HIV, currently not on treatment, tobacco abuse, alcohol abuse, traumatic subdural hematoma after acute alcohol intoxication who presents to the emergency department complains of chest pain, nausea, vomiting, abdominal pain, diarrhea.  Patient was found to be in acute alcohol withdrawal on presentation with high CIWA score.  Admitted for further management.  Assessment & Plan:   Principal Problem:   Alcohol withdrawal (HCC) Active Problems:   HIV disease (HCC)   Cigarette smoker   Major depressive disorder, recurrent severe without psychotic features (HCC)   Alcohol abuse with intoxication (HCC)   Thrombocytopenia (HCC)   Transaminitis   Diarrhea   Alcohol abuse with intoxication/withdrawal: Alcohol level at 506 on presentation.  C/n  thiamine and folic acid. He says he drinks 5 bottles of wine a day.  Has long history of alcohol abuse.  Counseled for alcohol cessation. PCCM was also consulted during admission who recommended IV phenobarbital. --Patient is in full-blown DTs at this time, continue lorazepam per CIWA protocol 1:1 safety sitter at bedside  Diarrhea: GI pathogen panel showed enteroaggregative of E. coli.  Diarrhea has improved.  Started on  ciprofloxacin because of his immunocompromised status.Can change oral antibiotic  when he can tolerate.   History of depression: Not on any treatment at present.  Denies any suicidal thoughts.  -Psychiatrist attempted but is currently unable to evaluate patient from a depression standpoint due to full-blown DTs  Elevated liver enzymes/thrombocytopenia/leucopenia:   Associated with chronic alcohol abuse.  Continue to monitor CBC  HIV: Not on any treatment due to noncompliance.Labs [facility] care everywhere revealed HIV RNA quantitation 16,700, percent T  helper cells 84.2, total T lymphocytes 0.59 T-helper leukocytes 0.24.Recommend outpatient follow-up with ID  Tobacco abuse: Nicotine patch   DVT prophylaxis: SCD Code Status: Full Family Communication: None present at the bedside Disposition Plan: Likely back to home in a couple of days after resolution of alcohol withdrawal.   Consultants: PCCM  Procedures: None  Antimicrobials:  Anti-infectives (From admission, onward)   Start     Dose/Rate Route Frequency Ordered Stop   07/26/19 2000  ciprofloxacin (CIPRO) tablet 500 mg  Status:  Discontinued     500 mg Oral 2 times daily 07/26/19 1522 07/26/19 1531   07/26/19 1700  ciprofloxacin (CIPRO) IVPB 200 mg     200 mg 100 mL/hr over 60 Minutes Intravenous Every 12 hours 07/26/19 1531        Subjective:  -Patient with full-blown DTs, incoherent, restless, anxious, tremors noted, tachycardic  Objective: Vitals:   07/27/19 0400 07/27/19 0700 07/27/19 1100 07/27/19 1522  BP: (!) 123/92 125/79 (!) 137/99 107/77  Pulse:  (!) 111 91 92  Resp: 20 18 19 19   Temp: 97.9 F (36.6 C) 98.1 F (36.7 C) 98.5 F (36.9 C) 98.5 F (36.9 C)  TempSrc: Oral Oral Oral Oral  SpO2: 100% 97% 100% 100%  Weight:      Height:        Intake/Output Summary (Last 24 hours) at 07/27/2019 1759 Last data filed at 07/27/2019 1201 Gross per 24 hour  Intake 540 ml  Output 300 ml  Net 240 ml   Filed Weights   07/24/19 2245  Weight: 68 kg   Examination:  General exam: Restless, incoherent  HEENT:Oral mucosa moist, Ear/Nose normal on gross exam Respiratory system: Bilateral  equal air entry, , no wheezes or crackles  Cardiovascular system: S1 & S2 heard, RRR.  Tachycardic, no pedal edema. Gastrointestinal system: Abdomen is nondistended, soft and nontender.  Normal bowel sounds heard. Central nervous system: Confused, disoriented, anxious, restless, incoherent  extremities: No edema, no clubbing ,no cyanosis, distal peripheral pulses  palpable. Skin: No rashes, lesions or ulcers,no icterus ,no pallor Psychiatry: Delirium tremens with confusion, disorientation and significant tremors  Data Reviewed:    CBC: Recent Labs  Lab 07/24/19 2240 07/26/19 0339 07/27/19 0320  WBC 4.1 2.8* 2.9*  NEUTROABS 2.2  --  1.7  HGB 13.9 12.8* 13.2  HCT 41.1 37.9* 39.1  MCV 98.6 100.0 98.2  PLT 83* 57* 64*   Basic Metabolic Panel: Recent Labs  Lab 07/24/19 2240 07/26/19 0339 07/27/19 0320  NA 140 135  --   K 3.6 3.6  --   CL 106 102  --   CO2 22 24  --   GLUCOSE 137* 80  --   BUN 5* <5*  --   CREATININE 0.67 0.64  --   CALCIUM 8.3* 8.8*  --   MG  --  1.5* 1.9  PHOS  --  3.5  --    GFR: Estimated Creatinine Clearance: 127.5 mL/min (by C-G formula based on SCr of 0.64 mg/dL). Liver Function Tests: Recent Labs  Lab 07/24/19 2240 07/26/19 0339  AST 150* 103*  ALT 98* 80*  ALKPHOS 83 77  BILITOT 0.7 1.2  PROT 7.0 6.4*  ALBUMIN 3.6 3.3*   No results for input(s): LIPASE, AMYLASE in the last 168 hours. No results for input(s): AMMONIA in the last 168 hours. Coagulation Profile: No results for input(s): INR, PROTIME in the last 168 hours. Cardiac Enzymes: No results for input(s): CKTOTAL, CKMB, CKMBINDEX, TROPONINI in the last 168 hours. BNP (last 3 results) No results for input(s): PROBNP in the last 8760 hours. HbA1C: No results for input(s): HGBA1C in the last 72 hours. CBG: No results for input(s): GLUCAP in the last 168 hours. Lipid Profile: No results for input(s): CHOL, HDL, LDLCALC, TRIG, CHOLHDL, LDLDIRECT in the last 72 hours. Thyroid Function Tests: No results for input(s): TSH, T4TOTAL, FREET4, T3FREE, THYROIDAB in the last 72 hours. Anemia Panel: No results for input(s): VITAMINB12, FOLATE, FERRITIN, TIBC, IRON, RETICCTPCT in the last 72 hours. Sepsis Labs: No results for input(s): PROCALCITON, LATICACIDVEN in the last 168 hours.  Recent Results (from the past 240 hour(s))  SARS CORONAVIRUS 2  (TAT 6-24 HRS) Nasopharyngeal Nasopharyngeal Swab     Status: None   Collection Time: 07/25/19 11:35 AM   Specimen: Nasopharyngeal Swab  Result Value Ref Range Status   SARS Coronavirus 2 NEGATIVE NEGATIVE Final    Comment: (NOTE) SARS-CoV-2 target nucleic acids are NOT DETECTED. The SARS-CoV-2 RNA is generally detectable in upper and lower respiratory specimens during the acute phase of infection. Negative results do not preclude SARS-CoV-2 infection, do not rule out co-infections with other pathogens, and should not be used as the sole basis for treatment or other patient management decisions. Negative results must be combined with clinical observations, patient history, and epidemiological information. The expected result is Negative. Fact Sheet for Patients: HairSlick.no Fact Sheet for Healthcare Providers: quierodirigir.com This test is not yet approved or cleared by the [place] FDA and  has been authorized for detection and/or diagnosis of SARS-CoV-2 by FDA under an Emergency Use Authorization (EUA). This EUA will remain  in effect (meaning this test can be used) for the  duration of the COVID-19 declaration under Section 56 4(b)(1) of the Act, 21 U.S.C. section 360bbb-3(b)(1), unless the authorization is terminated or revoked sooner. Performed at [facility], [place] [address], [place], [place] [address]   Gastrointestinal Panel by PCR , Stool     Status: Abnormal   Collection Time: 07/25/19 12:40 PM   Specimen: Stool  Result Value Ref Range Status   Campylobacter species NOT DETECTED NOT DETECTED Final   Plesimonas shigelloides NOT DETECTED NOT DETECTED Final   Salmonella species NOT DETECTED NOT DETECTED Final   Yersinia enterocolitica NOT DETECTED NOT DETECTED Final   Vibrio species NOT DETECTED NOT DETECTED Final   Vibrio cholerae NOT DETECTED NOT DETECTED Final   Enteroaggregative E coli (EAEC) DETECTED  (A) NOT DETECTED Final    Comment: RESULT CALLED TO, READ BACK BY AND VERIFIED WITH: JESSICA EASLEY AT 1030 ON 07/26/2019 JJB    Enteropathogenic E coli (EPEC) NOT DETECTED NOT DETECTED Final   Enterotoxigenic E coli (ETEC) NOT DETECTED NOT DETECTED Final   Shiga like toxin producing E coli (STEC) NOT DETECTED NOT DETECTED Final   Shigella/Enteroinvasive E coli (EIEC) NOT DETECTED NOT DETECTED Final   Cryptosporidium NOT DETECTED NOT DETECTED Final   Cyclospora cayetanensis NOT DETECTED NOT DETECTED Final   Entamoeba histolytica NOT DETECTED NOT DETECTED Final   Giardia lamblia NOT DETECTED NOT DETECTED Final   Adenovirus F40/41 NOT DETECTED NOT DETECTED Final   Astrovirus NOT DETECTED NOT DETECTED Final   Norovirus GI/GII NOT DETECTED NOT DETECTED Final   Rotavirus A NOT DETECTED NOT DETECTED Final   Sapovirus (I, II, IV, and V) NOT DETECTED NOT DETECTED Final    Comment: Performed at [facility], [address] [place], [place] [address]      Radiology Studies: No results found.  Scheduled Meds:  diazepam  5 mg Oral Once   folic acid  1 mg Oral Daily   LORazepam  0-4 mg Intravenous Q6H   Followed by   Derrill Memo ON 07/29/2019] LORazepam  0-4 mg Intravenous Q12H   multivitamin with minerals  1 tablet Oral Daily   nicotine  21 mg Transdermal Daily   pantoprazole (PROTONIX) IV  40 mg Intravenous Q12H   sodium chloride flush  3 mL Intravenous Q12H   thiamine  100 mg Oral Daily   Or   thiamine  100 mg Intravenous Daily   Continuous Infusions:  ciprofloxacin 200 mg (07/27/19 1627)   dextrose 5 % and 0.45% NaCl       LOS: 2 days    Roxan Hockey, MD [facility]  If 7PM-7AM, please contact night-coverage [email or web address] Password [facility] 07/27/2019, 5:59 PM

## 2019-07-28 MED ORDER — DIAZEPAM 5 MG PO TABS
5.0000 mg | ORAL_TABLET | Freq: Once | ORAL | Status: AC
  Administered 2019-07-29: 5 mg via ORAL
  Filled 2019-07-28: qty 1

## 2019-07-28 MED ORDER — POTASSIUM CHLORIDE CRYS ER 20 MEQ PO TBCR
40.0000 meq | EXTENDED_RELEASE_TABLET | Freq: Once | ORAL | Status: AC
  Administered 2019-07-28: 40 meq via ORAL
  Filled 2019-07-28: qty 2

## 2019-07-28 MED ORDER — CIPROFLOXACIN HCL 500 MG PO TABS
500.0000 mg | ORAL_TABLET | Freq: Two times a day (BID) | ORAL | Status: DC
  Administered 2019-07-28 – 2019-07-30 (×4): 500 mg via ORAL
  Filled 2019-07-28 (×4): qty 1

## 2019-07-28 MED ORDER — MAGNESIUM SULFATE 2 GM/50ML IV SOLN
2.0000 g | Freq: Once | INTRAVENOUS | Status: AC
  Administered 2019-07-28: 2 g via INTRAVENOUS
  Filled 2019-07-28: qty 50

## 2019-07-28 NOTE — Progress Notes (Signed)
PROGRESS NOTE    Gary [name]  TIR:[id number] DOB: 03-09-1986 DOA: 07/24/2019 PCP: Patient, No Pcp Per   Brief Narrative:  Patient is a 33 year old male with history of HIV, currently not on treatment, tobacco abuse, alcohol abuse, traumatic subdural hematoma after acute alcohol intoxication who presents to the emergency department complains of chest pain, nausea, vomiting, abdominal pain, diarrhea.  Patient was found to be in acute alcohol withdrawal on presentation with high CIWA score.  Admitted for further management.  Assessment & Plan:   Principal Problem:   Alcohol withdrawal ([place]) Active Problems:   HIV disease ([place])   Cigarette smoker   Major depressive disorder, recurrent severe without psychotic features ([place])   Alcohol abuse with intoxication ([place])   Thrombocytopenia ([place])   Transaminitis   Diarrhea   Alcohol abuse with intoxication/withdrawal: Alcohol level at 506 on presentation.  C/n  thiamine and folic acid. He says he drinks 5 bottles of wine a day.  Has long history of alcohol abuse.  Counseled for alcohol cessation. PCCM was also consulted during admission who recommended IV phenobarbital. --Patient is in full-blown DTs at this time, continue lorazepam per CIWA protocol 1:1 safety sitter at bedside - Had a very restless night required at least 6 mg of Ativan overnight, it be more peaceful now  Diarrhea: GI pathogen panel showed enteroaggregative of E. coli.  Diarrhea has improved.  Treat empirically with Cipro for 5 days because of his immunocompromised status.Marland Kitchen   History of depression: Not on any treatment at present.  Denies any suicidal thoughts.  -Psychiatrist attempted but is currently unable to evaluate patient from a depression standpoint due to full-blown DTs  Elevated liver enzymes/thrombocytopenia/leucopenia:   Associated with chronic alcohol abuse.  Continue to monitor CBC  HIV: Not on any treatment due to noncompliance.Labs from [facility]/16/2019 [facility]  care everywhere revealed HIV RNA quantitation 16,700, percent T helper cells 84.2, total T lymphocytes 0.59 T-helper leukocytes 0.24.Recommend outpatient follow-up with ID  Tobacco abuse: Nicotine patch   DVT prophylaxis: SCD Code Status: Full Family Communication: None present at the bedside Disposition Plan: Likely back to home in a couple of days after resolution of alcohol withdrawal.   Consultants: PCCM  Procedures: None  Antimicrobials:  Anti-infectives (From admission, onward)   Start     Dose/Rate Route Frequency Ordered Stop   07/28/19 1700  ciprofloxacin (CIPRO) tablet 500 mg     500 mg Oral 2 times daily 07/28/19 1118 07/31/19 1959   07/26/19 2000  ciprofloxacin (CIPRO) tablet 500 mg  Status:  Discontinued     500 mg Oral 2 times daily 07/26/19 1522 07/26/19 1531   07/26/19 1700  ciprofloxacin (CIPRO) IVPB 200 mg  Status:  Discontinued     200 mg 100 mL/hr over 60 Minutes Intravenous Every 12 hours 07/26/19 1531 07/28/19 1118      Subjective:  --Had a very restless night required at least 6 mg of Ativan overnight, it be more peaceful now -One-to-one sitter at bedside  Objective: Vitals:   07/28/19 0729 07/28/19 0805 07/28/19 1111 07/28/19 1522  BP:  115/80 116/75 106/64  Pulse:  97 97 72  Resp:  18 20 20   Temp: 98.2 F (36.8 C) 98.3 F (36.8 C) 98.2 F (36.8 C) 98.4 F (36.9 C)  TempSrc: Oral Oral Oral Oral  SpO2: 96% 96% 96% 100%  Weight:      Height:        Intake/Output Summary (Last 24 hours) at 07/28/2019 1836 Last data filed  at 07/28/2019 1757 Gross per 24 hour  Intake 1785.59 ml  Output 1725 ml  Net 60.59 ml   Filed Weights   07/24/19 2245  Weight: 68 kg   Examination:  General exam: Resting HEENT:Oral mucosa moist, Ear/Nose normal on gross exam Respiratory system: Bilateral equal air entry, , no wheezes or crackles  Cardiovascular system: S1 & S2 heard, RRR.  Tachycardic, no pedal edema. Gastrointestinal system: Abdomen is  nondistended, soft and nontender.  Normal bowel sounds heard. Central nervous system: Confused, disoriented, anxious, restless, incoherent  extremities: No edema, no clubbing ,no cyanosis, distal peripheral pulses palpable. Skin: No rashes, lesions or ulcers,no icterus ,no pallor Psychiatry: Delirium tremens with confusion, disorientation and tremors  Data Reviewed:    CBC: Recent Labs  Lab 07/24/19 2240 07/26/19 0339 07/27/19 0320  WBC 4.1 2.8* 2.9*  NEUTROABS 2.2  --  1.7  HGB 13.9 12.8* 13.2  HCT 41.1 37.9* 39.1  MCV 98.6 100.0 98.2  PLT 83* 57* 64*   Basic Metabolic Panel: Recent Labs  Lab 07/24/19 2240 07/26/19 0339 07/27/19 0320 07/27/19 1900  NA 140 135  --   --   K 3.6 3.6  --   --   CL 106 102  --   --   CO2 22 24  --   --   GLUCOSE 137* 80  --   --   BUN 5* <5*  --   --   CREATININE 0.67 0.64  --   --   CALCIUM 8.3* 8.8*  --   --   MG  --  1.5* 1.9 1.6*  PHOS  --  3.5  --   --    GFR: Estimated Creatinine Clearance: 127.5 mL/min (by C-G formula based on SCr of 0.64 mg/dL). Liver Function Tests: Recent Labs  Lab 07/24/19 2240 07/26/19 0339  AST 150* 103*  ALT 98* 80*  ALKPHOS 83 77  BILITOT 0.7 1.2  PROT 7.0 6.4*  ALBUMIN 3.6 3.3*   No results for input(s): LIPASE, AMYLASE in the last 168 hours. No results for input(s): AMMONIA in the last 168 hours. Coagulation Profile: No results for input(s): INR, PROTIME in the last 168 hours. Cardiac Enzymes: No results for input(s): CKTOTAL, CKMB, CKMBINDEX, TROPONINI in the last 168 hours. BNP (last 3 results) No results for input(s): PROBNP in the last 8760 hours. HbA1C: No results for input(s): HGBA1C in the last 72 hours. CBG: No results for input(s): GLUCAP in the last 168 hours. Lipid Profile: No results for input(s): CHOL, HDL, LDLCALC, TRIG, CHOLHDL, LDLDIRECT in the last 72 hours. Thyroid Function Tests: No results for input(s): TSH, T4TOTAL, FREET4, T3FREE, THYROIDAB in the last 72 hours.  Anemia Panel: No results for input(s): VITAMINB12, FOLATE, FERRITIN, TIBC, IRON, RETICCTPCT in the last 72 hours. Sepsis Labs: No results for input(s): PROCALCITON, LATICACIDVEN in the last 168 hours.  Recent Results (from the past 240 hour(s))  SARS CORONAVIRUS 2 (TAT 6-24 HRS) Nasopharyngeal Nasopharyngeal Swab     Status: None   Collection Time: 07/25/19 11:35 AM   Specimen: Nasopharyngeal Swab  Result Value Ref Range Status   SARS Coronavirus 2 NEGATIVE NEGATIVE Final    Comment: (NOTE) SARS-CoV-2 target nucleic acids are NOT DETECTED. The SARS-CoV-2 RNA is generally detectable in upper and lower respiratory specimens during the acute phase of infection. Negative results do not preclude SARS-CoV-2 infection, do not rule out co-infections with other pathogens, and should not be used as the sole basis for treatment or other patient  management decisions. Negative results must be combined with clinical observations, patient history, and epidemiological information. The expected result is Negative. Fact Sheet for Patients: HairSlick.no Fact Sheet for Healthcare Providers: quierodirigir.com This test is not yet approved or cleared by the [place] FDA and  has been authorized for detection and/or diagnosis of SARS-CoV-2 by FDA under an Emergency Use Authorization (EUA). This EUA will remain  in effect (meaning this test can be used) for the duration of the COVID-19 declaration under Section 56 4(b)(1) of the Act, 21 U.S.C. section 360bbb-3(b)(1), unless the authorization is terminated or revoked sooner. Performed at [facility], [address], [place], [place] [address]   Gastrointestinal Panel by PCR , Stool     Status: Abnormal   Collection Time: 07/25/19 12:40 PM   Specimen: Stool  Result Value Ref Range Status   Campylobacter species NOT DETECTED NOT DETECTED Final   Plesimonas shigelloides NOT DETECTED NOT  DETECTED Final   Salmonella species NOT DETECTED NOT DETECTED Final   Yersinia enterocolitica NOT DETECTED NOT DETECTED Final   Vibrio species NOT DETECTED NOT DETECTED Final   Vibrio cholerae NOT DETECTED NOT DETECTED Final   Enteroaggregative E coli (EAEC) DETECTED (A) NOT DETECTED Final    Comment: RESULT CALLED TO, READ BACK BY AND VERIFIED WITH: JESSICA EASLEY AT 1030 ON 07/26/2019 JJB    Enteropathogenic E coli (EPEC) NOT DETECTED NOT DETECTED Final   Enterotoxigenic E coli (ETEC) NOT DETECTED NOT DETECTED Final   Shiga like toxin producing E coli (STEC) NOT DETECTED NOT DETECTED Final   Shigella/Enteroinvasive E coli (EIEC) NOT DETECTED NOT DETECTED Final   Cryptosporidium NOT DETECTED NOT DETECTED Final   Cyclospora cayetanensis NOT DETECTED NOT DETECTED Final   Entamoeba histolytica NOT DETECTED NOT DETECTED Final   Giardia lamblia NOT DETECTED NOT DETECTED Final   Adenovirus F40/41 NOT DETECTED NOT DETECTED Final   Astrovirus NOT DETECTED NOT DETECTED Final   Norovirus GI/GII NOT DETECTED NOT DETECTED Final   Rotavirus A NOT DETECTED NOT DETECTED Final   Sapovirus (I, II, IV, and V) NOT DETECTED NOT DETECTED Final    Comment: Performed at [facility], [address], [place], [place] [address]      Radiology Studies: No results found.  Scheduled Meds: . ciprofloxacin  500 mg Oral BID  . diazepam  5 mg Oral Once  . folic acid  1 mg Oral Daily  . LORazepam  0-4 mg Intravenous Q6H   Followed by  . [START ON 07/29/2019] LORazepam  0-4 mg Intravenous Q12H  . multivitamin with minerals  1 tablet Oral Daily  . nicotine  21 mg Transdermal Daily  . pantoprazole (PROTONIX) IV  40 mg Intravenous Q12H  . sodium chloride flush  3 mL Intravenous Q12H  . thiamine  100 mg Oral Daily   Or  . thiamine  100 mg Intravenous Daily   Continuous Infusions: . dextrose 5 % and 0.45% NaCl 125 mL/hr at 07/28/19 1443    LOS: 3 days   Shon Hale, MD [facility]   If 7PM-7AM, please contact night-coverage [email or web address] Password [facility] 07/28/2019, 6:36 PM

## 2019-07-29 LAB — COMPREHENSIVE METABOLIC PANEL
ALT: 80 U/L — ABNORMAL HIGH (ref 0–44)
AST: 71 U/L — ABNORMAL HIGH (ref 15–41)
Albumin: 3 g/dL — ABNORMAL LOW (ref 3.5–5.0)
Alkaline Phosphatase: 77 U/L (ref 38–126)
Anion gap: 7 (ref 5–15)
BUN: 7 mg/dL (ref 6–20)
CO2: 22 mmol/L (ref 22–32)
Calcium: 8.6 mg/dL — ABNORMAL LOW (ref 8.9–10.3)
Chloride: 106 mmol/L (ref 98–111)
Creatinine, Ser: 0.7 mg/dL (ref 0.61–1.24)
GFR calc Af Amer: >60 mL/min (ref >60)
GFR calc non Af Amer: >60 mL/min (ref >60)
Glucose, Bld: 103 mg/dL — ABNORMAL HIGH (ref 70–99)
Potassium: 3.4 mmol/L — ABNORMAL LOW (ref 3.5–5.1)
Sodium: 135 mmol/L (ref 135–145)
Total Bilirubin: 0.8 mg/dL (ref 0.3–1.2)
Total Protein: 6.3 g/dL — ABNORMAL LOW (ref 6.5–8.1)

## 2019-07-29 LAB — CBC
HCT: 37.8 % — ABNORMAL LOW (ref 39.0–52.0)
Hemoglobin: 12.8 g/dL — ABNORMAL LOW (ref 13.0–17.0)
MCH: 34 pg (ref 26.0–34.0)
MCHC: 33.9 g/dL (ref 30.0–36.0)
MCV: 100.5 fL — ABNORMAL HIGH (ref 80.0–100.0)
Platelets: 114 10*3/uL — ABNORMAL LOW (ref 150–400)
RBC: 3.76 MIL/uL — ABNORMAL LOW (ref 4.22–5.81)
RDW: 14.3 % (ref 11.5–15.5)
WBC: 3.3 10*3/uL — ABNORMAL LOW (ref 4.0–10.5)
nRBC: 0 % (ref 0.0–0.2)

## 2019-07-29 LAB — MAGNESIUM: Magnesium: 1.9 mg/dL (ref 1.7–2.4)

## 2019-07-29 MED ORDER — DIAZEPAM 5 MG PO TABS
5.0000 mg | ORAL_TABLET | Freq: Once | ORAL | Status: AC
  Administered 2019-07-29: 5 mg via ORAL
  Filled 2019-07-29: qty 1

## 2019-07-29 MED ORDER — DIAZEPAM 5 MG PO TABS
5.0000 mg | ORAL_TABLET | Freq: Once | ORAL | Status: DC
  Filled 2019-07-29: qty 1

## 2019-07-29 MED ORDER — PANTOPRAZOLE SODIUM 40 MG PO TBEC
40.0000 mg | DELAYED_RELEASE_TABLET | Freq: Every day | ORAL | Status: DC
  Administered 2019-07-29 – 2019-07-30 (×2): 40 mg via ORAL
  Filled 2019-07-29 (×2): qty 1

## 2019-07-29 MED ORDER — POTASSIUM CHLORIDE CRYS ER 20 MEQ PO TBCR
40.0000 meq | EXTENDED_RELEASE_TABLET | ORAL | Status: AC
  Administered 2019-07-29 (×2): 40 meq via ORAL
  Filled 2019-07-29 (×2): qty 2
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
## 2019-07-29 NOTE — Progress Notes (Signed)
PROGRESS NOTE    GLENDALE [name]  MHD:[id number] DOB: 1986/04/15 DOA: 07/24/2019 PCP: Patient, No Pcp Per   Brief Narrative:  Patient is a 33 year old male with history of HIV, currently not on treatment, tobacco abuse, alcohol abuse, traumatic subdural hematoma after acute alcohol intoxication who presents to the emergency department complains of chest pain, nausea, vomiting, abdominal pain, diarrhea.  Patient was found to be in acute alcohol withdrawal on presentation with high CIWA score.  Admitted for further management. -Repeatedly denies depressed mood at this time, repeatedly denies homicidal or suicidal ideation or plan at this time  Assessment & Plan:   Principal Problem:   Alcohol withdrawal ([place]) Active Problems:   HIV disease ([place])   Cigarette smoker   Major depressive disorder, recurrent severe without psychotic features ([place])   Alcohol abuse with intoxication ([place])   Thrombocytopenia ([place])   Transaminitis   Diarrhea   Alcohol abuse with intoxication/withdrawal: Alcohol level at 506 on presentation.  C/n  thiamine and folic acid. He says he drinks 5 bottles of wine a day.  Has long history of alcohol abuse.  Counseled for alcohol cessation. PCCM was also consulted during admission who recommended IV phenobarbital. -- delirium tremens symptoms are resolving, continue lorazepam per CIWA protocol 1:1 safety sitter at bedside - Diarrhea: GI pathogen panel showed enteroaggregative of E. coli.  Diarrhea has improved.  Treat empirically with Cipro for 5 days because of his immunocompromised status.Marland Kitchen   History of depression: Not on any treatment at present.  Denies any suicidal thoughts.  -Psychiatrist attempted but is currently unable to evaluate patient from a depression standpoint due to full-blown DTs --DTs have mostly resolved at this time, charge nurse Levada Dy at bedside... Cooperative and coherent- Repeatedly denies depressed mood at this time, repeatedly denies  homicidal or suicidal ideation or plan at this time  Elevated liver enzymes/thrombocytopenia/leucopenia:   Associated with chronic alcohol abuse.  Continue to monitor CBC  HIV: Not on any treatment due to noncompliance.Labs from 05/16/2019 [facility] care everywhere revealed HIV RNA quantitation 16,700, percent T helper cells 84.2, total T lymphocytes 0.59 T-helper leukocytes 0.24.Recommend outpatient follow-up with ID  Tobacco abuse: Nicotine patch  DVT prophylaxis: SCD Code Status: Full Family Communication: None present at the bedside Disposition Plan: Likely back to home in a couple of days after resolution of alcohol withdrawal.   Consultants: PCCM  Procedures: None  Antimicrobials:  Anti-infectives (From admission, onward)   Start     Dose/Rate Route Frequency Ordered Stop   07/28/19 1700  ciprofloxacin (CIPRO) tablet 500 mg     500 mg Oral 2 times daily 07/28/19 1118 07/31/19 1959   07/26/19 2000  ciprofloxacin (CIPRO) tablet 500 mg  Status:  Discontinued     500 mg Oral 2 times daily 07/26/19 1522 07/26/19 1531   07/26/19 1700  ciprofloxacin (CIPRO) IVPB 200 mg  Status:  Discontinued     200 mg 100 mL/hr over 60 Minutes Intravenous Every 12 hours 07/26/19 1531 07/28/19 1118      Subjective:  DTs have mostly resolved at this time, charge nurse Levada Dy at bedside... Cooperative and coherent- Repeatedly denies depressed mood at this time, repeatedly denies homicidal or suicidal ideation or plan at this time  -Ambulatory physical therapy, did okay  Objective: Vitals:   07/28/19 2031 07/29/19 0606 07/29/19 1105 07/29/19 1651  BP: 103/62 101/65 98/65 107/66  Pulse: 80 60 81 82  Resp: 16 14 15 11   Temp: 98.6 F (37 C) 98.3 F (36.8 C)  98.2 F (36.8 C) 98.7 F (37.1 C)  TempSrc: Oral Oral Oral Oral  SpO2: 97% 96% 97% 98%  Weight:      Height:        Intake/Output Summary (Last 24 hours) at 07/29/2019 1754 Last data filed at 07/29/2019 1241 Gross per 24 hour  Intake  1390 ml  Output 1575 ml  Net -185 ml   Filed Weights   07/24/19 2245  Weight: 68 kg   Examination:  General exam: Resting, very cooperative HEENT:Oral mucosa moist, Ear/Nose normal on gross exam Respiratory system: Bilateral equal air entry, , no wheezes or crackles  Cardiovascular system: S1 & S2 heard, RRR.  Tachycardic, no pedal edema. Gastrointestinal system: Abdomen is nondistended, soft and nontender.  Normal bowel sounds heard. Central nervous system: Much more coherent extremities: No edema, no clubbing ,no cyanosis, distal peripheral pulses palpable. Skin: No rashes, lesions or ulcers,no icterus ,no pallor Psychiatry: Much more coherent, tremors are resolving.  Repeatedly denies depressed mood at this time, repeatedly denies homicidal or suicidal ideation or plan at this time  Data Reviewed:    CBC: Recent Labs  Lab 07/24/19 2240 07/26/19 0339 07/27/19 0320 07/29/19 0422  WBC 4.1 2.8* 2.9* 3.3*  NEUTROABS 2.2  --  1.7  --   HGB 13.9 12.8* 13.2 12.8*  HCT 41.1 37.9* 39.1 37.8*  MCV 98.6 100.0 98.2 100.5*  PLT 83* 57* 64* 114*   Basic Metabolic Panel: Recent Labs  Lab 07/24/19 2240 07/26/19 0339 07/27/19 0320 07/27/19 1900 07/29/19 0422  NA 140 135  --   --  135  K 3.6 3.6  --   --  3.4*  CL 106 102  --   --  106  CO2 22 24  --   --  22  GLUCOSE 137* 80  --   --  103*  BUN 5* <5*  --   --  7  CREATININE 0.67 0.64  --   --  0.70  CALCIUM 8.3* 8.8*  --   --  8.6*  MG  --  1.5* 1.9 1.6* 1.9  PHOS  --  3.5  --   --   --    GFR: Estimated Creatinine Clearance: 127.5 mL/min (by C-G formula based on SCr of 0.7 mg/dL). Liver Function Tests: Recent Labs  Lab 07/24/19 2240 07/26/19 0339 07/29/19 0422  AST 150* 103* 71*  ALT 98* 80* 80*  ALKPHOS 83 77 77  BILITOT 0.7 1.2 0.8  PROT 7.0 6.4* 6.3*  ALBUMIN 3.6 3.3* 3.0*   No results for input(s): LIPASE, AMYLASE in the last 168 hours. No results for input(s): AMMONIA in the last 168 hours. Coagulation  Profile: No results for input(s): INR, PROTIME in the last 168 hours. Cardiac Enzymes: No results for input(s): CKTOTAL, CKMB, CKMBINDEX, TROPONINI in the last 168 hours. BNP (last 3 results) No results for input(s): PROBNP in the last 8760 hours. HbA1C: No results for input(s): HGBA1C in the last 72 hours. CBG: No results for input(s): GLUCAP in the last 168 hours. Lipid Profile: No results for input(s): CHOL, HDL, LDLCALC, TRIG, CHOLHDL, LDLDIRECT in the last 72 hours. Thyroid Function Tests: No results for input(s): TSH, T4TOTAL, FREET4, T3FREE, THYROIDAB in the last 72 hours. Anemia Panel: No results for input(s): VITAMINB12, FOLATE, FERRITIN, TIBC, IRON, RETICCTPCT in the last 72 hours. Sepsis Labs: No results for input(s): PROCALCITON, LATICACIDVEN in the last 168 hours.  Recent Results (from the past 240 hour(s))  SARS CORONAVIRUS 2 (TAT 6-24  HRS) Nasopharyngeal Nasopharyngeal Swab     Status: None   Collection Time: 07/25/19 11:35 AM   Specimen: Nasopharyngeal Swab  Result Value Ref Range Status   SARS Coronavirus 2 NEGATIVE NEGATIVE Final    Comment: (NOTE) SARS-CoV-2 target nucleic acids are NOT DETECTED. The SARS-CoV-2 RNA is generally detectable in upper and lower respiratory specimens during the acute phase of infection. Negative results do not preclude SARS-CoV-2 infection, do not rule out co-infections with other pathogens, and should not be used as the sole basis for treatment or other patient management decisions. Negative results must be combined with clinical observations, patient history, and epidemiological information. The expected result is Negative. Fact Sheet for Patients: HairSlick.nohttps://www.fda.gov/media/138098/download Fact Sheet for Healthcare Providers: quierodirigir.comhttps://www.fda.gov/media/138095/download This test is not yet approved or cleared by the [place] FDA and  has been authorized for detection and/or diagnosis of SARS-CoV-2 by FDA under an Emergency  Use Authorization (EUA). This EUA will remain  in effect (meaning this test can be used) for the duration of the COVID-19 declaration under Section 56 4(b)(1) of the Act, 21 U.S.C. section 360bbb-3(b)(1), unless the authorization is terminated or revoked sooner. Performed at [facility], [address] [place], [place] [address]   Gastrointestinal Panel by PCR , Stool     Status: Abnormal   Collection Time: 07/25/19 12:40 PM   Specimen: Stool  Result Value Ref Range Status   Campylobacter species NOT DETECTED NOT DETECTED Final   Plesimonas shigelloides NOT DETECTED NOT DETECTED Final   Salmonella species NOT DETECTED NOT DETECTED Final   Yersinia enterocolitica NOT DETECTED NOT DETECTED Final   Vibrio species NOT DETECTED NOT DETECTED Final   Vibrio cholerae NOT DETECTED NOT DETECTED Final   Enteroaggregative E coli (EAEC) DETECTED (A) NOT DETECTED Final    Comment: RESULT CALLED TO, READ BACK BY AND VERIFIED WITH: JESSICA EASLEY AT 1030 ON 07/26/2019 JJB    Enteropathogenic E coli (EPEC) NOT DETECTED NOT DETECTED Final   Enterotoxigenic E coli (ETEC) NOT DETECTED NOT DETECTED Final   Shiga like toxin producing E coli (STEC) NOT DETECTED NOT DETECTED Final   Shigella/Enteroinvasive E coli (EIEC) NOT DETECTED NOT DETECTED Final   Cryptosporidium NOT DETECTED NOT DETECTED Final   Cyclospora cayetanensis NOT DETECTED NOT DETECTED Final   Entamoeba histolytica NOT DETECTED NOT DETECTED Final   Giardia lamblia NOT DETECTED NOT DETECTED Final   Adenovirus F40/41 NOT DETECTED NOT DETECTED Final   Astrovirus NOT DETECTED NOT DETECTED Final   Norovirus GI/GII NOT DETECTED NOT DETECTED Final   Rotavirus A NOT DETECTED NOT DETECTED Final   Sapovirus (I, II, IV, and V) NOT DETECTED NOT DETECTED Final    Comment: Performed at [facility], [address] [place], [place] [address]    Radiology Studies: No results found.  Scheduled Meds: . ciprofloxacin  500 mg Oral BID   . diazepam  5 mg Oral Once  . diazepam  5 mg Oral Once  . diazepam  5 mg Oral Once  . folic acid  1 mg Oral Daily  . LORazepam  0-4 mg Intravenous Q6H   Followed by  . LORazepam  0-4 mg Intravenous Q12H  . multivitamin with minerals  1 tablet Oral Daily  . nicotine  21 mg Transdermal Daily  . pantoprazole  40 mg Oral Daily  . sodium chloride flush  3 mL Intravenous Q12H  . thiamine  100 mg Oral Daily   Or  . thiamine  100 mg Intravenous  Daily   Continuous Infusions: . dextrose 5 % and 0.45% NaCl 40 mL/hr at 07/29/19 1314    LOS: 4 days   Shon Hale, MD [facility]  If 7PM-7AM, please contact night-coverage [email or web address] Password [facility] 07/29/2019, 5:54 PM

## 2019-07-30 LAB — CBC
HCT: 37.2 % — ABNORMAL LOW (ref 39.0–52.0)
Hemoglobin: 12.9 g/dL — ABNORMAL LOW (ref 13.0–17.0)
MCH: 34.9 pg — ABNORMAL HIGH (ref 26.0–34.0)
MCHC: 34.7 g/dL (ref 30.0–36.0)
MCV: 100.5 fL — ABNORMAL HIGH (ref 80.0–100.0)
Platelets: 126 10*3/uL — ABNORMAL LOW (ref 150–400)
RBC: 3.7 MIL/uL — ABNORMAL LOW (ref 4.22–5.81)
RDW: 14.4 % (ref 11.5–15.5)
WBC: 3.2 10*3/uL — ABNORMAL LOW (ref 4.0–10.5)
nRBC: 0 % (ref 0.0–0.2)

## 2019-07-30 LAB — COMPREHENSIVE METABOLIC PANEL
ALT: 96 U/L — ABNORMAL HIGH (ref 0–44)
AST: 78 U/L — ABNORMAL HIGH (ref 15–41)
Albumin: 3 g/dL — ABNORMAL LOW (ref 3.5–5.0)
Alkaline Phosphatase: 68 U/L (ref 38–126)
Anion gap: 5 (ref 5–15)
BUN: 5 mg/dL — ABNORMAL LOW (ref 6–20)
CO2: 23 mmol/L (ref 22–32)
Calcium: 8.9 mg/dL (ref 8.9–10.3)
Chloride: 108 mmol/L (ref 98–111)
Creatinine, Ser: 0.73 mg/dL (ref 0.61–1.24)
GFR calc Af Amer: >60 mL/min (ref >60)
GFR calc non Af Amer: >60 mL/min (ref >60)
Glucose, Bld: 91 mg/dL (ref 70–99)
Potassium: 3.9 mmol/L (ref 3.5–5.1)
Sodium: 136 mmol/L (ref 135–145)
Total Bilirubin: 0.6 mg/dL (ref 0.3–1.2)
Total Protein: 6.2 g/dL — ABNORMAL LOW (ref 6.5–8.1)

## 2019-07-30 MED ORDER — ADULT MULTIVITAMIN W/MINERALS CH: 1.0000 | ORAL_TABLET | Freq: Every day | ORAL | 2 refills | Status: DC

## 2019-07-30 MED ORDER — NICOTINE 21 MG/24HR TD PT24: 21.0000 mg | MEDICATED_PATCH | Freq: Every day | TRANSDERMAL | 0 refills | Status: DC

## 2019-07-30 MED ORDER — DIAZEPAM 5 MG PO TABS
5.0000 mg | ORAL_TABLET | Freq: Once | ORAL | Status: AC
  Administered 2019-07-30: 5 mg via ORAL
  Filled 2019-07-30: qty 1

## 2019-07-30 MED ORDER — PANTOPRAZOLE SODIUM 40 MG PO TBEC: 40.0000 mg | DELAYED_RELEASE_TABLET | Freq: Every day | ORAL | 2 refills | Status: DC

## 2019-07-30 MED ORDER — FOLIC ACID 1 MG PO TABS: 1.0000 mg | ORAL_TABLET | Freq: Every day | ORAL | 2 refills | Status: DC

## 2019-07-30 MED ORDER — THIAMINE HCL 100 MG PO TABS: 100.0000 mg | ORAL_TABLET | Freq: Every day | ORAL | 2 refills | Status: DC
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
## 2019-07-30 NOTE — Plan of Care (Signed)
  Problem: Education: Goal: Knowledge of General Education information will improve Description: Including pain rating scale, medication(s)/side effects and non-pharmacologic comfort measures Outcome: Adequate for Discharge   Problem: Health Behavior/Discharge Planning: Goal: Ability to manage health-related needs will improve Outcome: Adequate for Discharge   Problem: Clinical Measurements: Goal: Ability to maintain clinical measurements within normal limits will improve Outcome: Adequate for Discharge Goal: Will remain free from infection Outcome: Adequate for Discharge Goal: Diagnostic test results will improve Outcome: Adequate for Discharge Goal: Respiratory complications will improve Outcome: Adequate for Discharge Goal: Cardiovascular complication will be avoided Outcome: Adequate for Discharge   Problem: Activity: Goal: Risk for activity intolerance will decrease Outcome: Adequate for Discharge   Problem: Nutrition: Goal: Adequate nutrition will be maintained Outcome: Adequate for Discharge   Problem: Coping: Goal: Level of anxiety will decrease Outcome: Adequate for Discharge   Problem: Elimination: Goal: Will not experience complications related to bowel motility Outcome: Adequate for Discharge Goal: Will not experience complications related to urinary retention Outcome: Adequate for Discharge   Problem: Pain Managment: Goal: General experience of comfort will improve Outcome: Adequate for Discharge   Problem: Safety: Goal: Ability to remain free from injury will improve Outcome: Adequate for Discharge   Problem: Skin Integrity: Goal: Risk for impaired skin integrity will decrease Outcome: Adequate for Discharge   Problem: Education: Goal: Knowledge of disease or condition will improve Outcome: Adequate for Discharge Goal: Understanding of discharge needs will improve Outcome: Adequate for Discharge   Problem: Health Behavior/Discharge  Planning: Goal: Ability to identify changes in lifestyle to reduce recurrence of condition will improve Outcome: Adequate for Discharge Goal: Identification of resources available to assist in meeting health care needs will improve Outcome: Adequate for Discharge   Problem: Physical Regulation: Goal: Complications related to the disease process, condition or treatment will be avoided or minimized Outcome: Adequate for Discharge   Problem: Safety: Goal: Ability to remain free from injury will improve Outcome: Adequate for Discharge   
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
## 2019-07-30 NOTE — Discharge Instructions (Signed)
1) you have been given information/resources about where to get help with your alcohol and polysubstance/drug abuse problems 2) you have been given information and resources about how to get to a shelter 3) complete abstinence from alcohol strongly advised 4) complete abstinence from drug strongly advised 5) you may use nicotine patch to help you quit smoking 6) you need to follow-up with a gastroenterologist for further evaluation of your liver problems 7) you need to follow-up with infectious disease specialist for management of your HIV infection
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
## 2019-07-30 NOTE — Progress Notes (Signed)
Patient for discharge today.  He said he could stay with a friend in [place] after he will the hospital.  AVS discuss.  IV removed.  Patient appear comfortable ,  Denies any headache or discomfort upon [place].

## 2019-09-19 ENCOUNTER — Ambulatory Visit (INDEPENDENT_AMBULATORY_CARE_PROVIDER_SITE_OTHER): Admitting: Infectious Diseases

## 2019-09-19 ENCOUNTER — Other Ambulatory Visit: Payer: Self-pay

## 2019-09-19 VITALS — Wt 155.0 lb

## 2019-09-19 DIAGNOSIS — B20 Human immunodeficiency virus [HIV] disease: Secondary | ICD-10-CM | POA: Diagnosis not present

## 2019-09-19 DIAGNOSIS — F1020 Alcohol dependence, uncomplicated: Secondary | ICD-10-CM | POA: Diagnosis not present

## 2019-09-19 DIAGNOSIS — S065X9S Traumatic subdural hemorrhage with loss of consciousness of unspecified duration, sequela: Secondary | ICD-10-CM | POA: Diagnosis not present

## 2019-09-19 DIAGNOSIS — Z23 Encounter for immunization: Secondary | ICD-10-CM

## 2019-09-19 MED ORDER — GENVOYA 150-150-200-10 MG PO TABS: 1.0000 | ORAL_TABLET | Freq: Every day | ORAL | 1 refills | Status: DC

## 2019-09-19 NOTE — Patient Instructions (Signed)
Stop drinking alcohol. Attend AA meetings while in jail if offered or at least once released from jail. Take genvoya 1 tab daily, misisng no doses once made available.

## 2019-09-19 NOTE — Progress Notes (Signed)
HPI: ROCIO [name] is a 33 y.o. male who presents to the [facility] pharmacy clinic for HIV follow-up.  Patient Active Problem List   Diagnosis Date Noted  . Alcohol withdrawal (HCC) 07/25/2019  . Thrombocytopenia (HCC) 07/25/2019  . Transaminitis 07/25/2019  . Diarrhea 07/25/2019  . Traumatic subdural hematoma (HCC) 07/02/2019  . Alcohol abuse with intoxication (HCC) 07/02/2019  . Hypocalcemia 07/02/2019  . Alcohol abuse with alcohol-induced mood disorder (HCC) 05/13/2019  . MDD (major depressive disorder), recurrent severe, without psychosis (HCC) 04/16/2019  . Alcohol dependence (HCC) 04/16/2019  . 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 HIV disease (HCC) 06/16/2018  . Polysubstance abuse (HCC) 06/16/2018  . Cigarette smoker 06/16/2018  . Penile cellulitis 06/15/2018    Patient's Medications  New Prescriptions   ELVITEGRAVIR-COBICISTAT-EMTRICITABINE-TENOFOVIR (GENVOYA) 150-150-200-10 MG TABS TABLET    Take 1 tablet by mouth daily with breakfast.  Previous Medications   FOLIC ACID (FOLVITE) 1 MG TABLET    Take 1 tablet (1 mg total) by mouth daily.   MULTIPLE VITAMIN (MULTIVITAMIN WITH MINERALS) TABS TABLET    Take 1 tablet by mouth daily.   NICOTINE (NICODERM CQ - DOSED IN MG/24 HOURS) 21 MG/24HR PATCH    Place 1 patch (21 mg total) onto the skin daily.   PANTOPRAZOLE (PROTONIX) 40 MG TABLET    Take 1 tablet (40 mg total) by mouth daily.   THIAMINE 100 MG TABLET    Take 1 tablet (100 mg total) by mouth daily.  Modified Medications   No medications on file  Discontinued Medications   No medications on file    Allergies: Allergies  Allergen Reactions  . Caffeine Palpitations    Past Medical History: Past Medical History:  Diagnosis Date  . Alcohol abuse   . HIV (human immunodeficiency virus infection) (HCC)   . Subdural hematoma (HCC)     Social History: Social History   Socioeconomic History  . Marital status:  Single    Spouse name: Not on file  . Number of children: Not on file  . Years of education: Not on file  . Highest education level: Not on file  Occupational History  . Not on file  Social Needs  . Financial resource strain: Not on file  . Food insecurity    Worry: Not on file    Inability: Not on file  . Transportation needs    Medical: Not on file    Non-medical: Not on file  Tobacco Use  . Smoking status: Current Every Day Smoker    Packs/day: 0.50    Types: Cigarettes  . Smokeless tobacco: Never Used  Substance and Sexual Activity  . Alcohol use: Yes    Alcohol/week: 3.0 standard drinks    Types: 3 Cans of beer per week    Comment: 24 oz  . Drug use: Yes    Types: Marijuana, Methamphetamines    Comment: last used 04/10/2019  . Sexual activity: Never  Lifestyle  . Physical activity    Days per week: Not on file    Minutes per session: Not on file  . Stress: Not on file  Relationships  . Social Musician on phone: Not on file    Gets together: Not on file    Attends religious service: Not on file    Active member of club or organization: Not on file    Attends meetings of clubs or organizations: Not on file    Relationship  status: Not on file  Other Topics Concern  . Not on file  Social History Narrative   Staying with a friend   Independent at baseline    Labs: Lab Results  Component Value Date   HIV1RNAQUANT 96,400 06/15/2018    RPR and STI Lab Results  Component Value Date   LABRPR Non Reactive 06/15/2018   LABRPR Non Reactive 01/30/2018    STI Results GC CT  01/30/2018 **POSITIVE**(A) Negative    Hepatitis B No results found for: HEPBSAB, HEPBSAG, HEPBCAB Hepatitis C No results found for: HEPCAB, HCVRNAPCRQN Hepatitis A No results found for: HAV Lipids: No results found for: CHOL, TRIG, HDL, CHOLHDL, VLDL, LDLCALC  Current HIV Regimen: Genvoya  Assessment: Dr. Prince Rome asked Cassie and I to see Gary [name] regarding his HIV  medication. He is presenting here from [facility] detention to be restarted on HIV treatment. He has been on Genvoya in the past and would like to be restarted on this. Gary [name] is generally well tolerated and he reports tolerating this medication well in the past. We reiterated the importance of adherence and if he experiences any new side effects to contact the clinic.   Plan: - Initiate Genvoya - Lipid profile, Quantiferon-TB, HepA, HepB, HepC, RPR, CBC w/diff, CMET, HIV RNA w/reflex to genotype, CD4   Nicoletta Dress, PharmD PGY2 Infectious Disease Pharmacy Resident  [facility] 09/19/2019, 3:47 PM
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
## 2019-09-20 LAB — T-HELPER CELLS (CD4) COUNT (NOT AT ARMC)
CD4 % Helper T Cell: 29 % — ABNORMAL LOW (ref 33–65)
CD4 T Cell Abs: 320 /uL — ABNORMAL LOW (ref 400–1790)

## 2019-09-22 LAB — CBC WITH DIFFERENTIAL/PLATELET
Absolute Monocytes: 307 cells/uL (ref 200–950)
Basophils Absolute: 29 cells/uL (ref 0–200)
Basophils Relative: 0.9 %
Eosinophils Absolute: 99 cells/uL (ref 15–500)
Eosinophils Relative: 3.1 %
HCT: 41.5 % (ref 38.5–50.0)
Hemoglobin: 14.3 g/dL (ref 13.2–17.1)
Lymphs Abs: 1027 cells/uL (ref 850–3900)
MCH: 32 pg (ref 27.0–33.0)
MCHC: 34.5 g/dL (ref 32.0–36.0)
MCV: 92.8 fL (ref 80.0–100.0)
MPV: 10.7 fL (ref 7.5–12.5)
Monocytes Relative: 9.6 %
Neutro Abs: 1738 cells/uL (ref 1500–7800)
Neutrophils Relative %: 54.3 %
Platelets: 195 10*3/uL (ref 140–400)
RBC: 4.47 10*6/uL (ref 4.20–5.80)
RDW: 12 % (ref 11.0–15.0)
Total Lymphocyte: 32.1 %
WBC: 3.2 10*3/uL — ABNORMAL LOW (ref 3.8–10.8)

## 2019-09-22 LAB — QUANTIFERON-TB GOLD PLUS
Mitogen-NIL: >10 IU/mL
NIL: 0.04 IU/mL
QuantiFERON-TB Gold Plus: NEGATIVE
TB1-NIL: 0.02 IU/mL
TB2-NIL: 0 IU/mL

## 2019-09-22 LAB — HEPATITIS C ANTIBODY
Hepatitis C Ab: NONREACTIVE
SIGNAL TO CUT-OFF: 0.12 (ref <1.00)

## 2019-09-22 LAB — LIPID PANEL
Cholesterol: 135 mg/dL (ref <200)
HDL: 28 mg/dL — ABNORMAL LOW (ref >=40)
LDL Cholesterol (Calc): 79 mg/dL (calc)
Non-HDL Cholesterol (Calc): 107 mg/dL (calc) (ref <130)
Total CHOL/HDL Ratio: 4.8 (calc) (ref <5.0)
Triglycerides: 182 mg/dL — ABNORMAL HIGH (ref <150)

## 2019-09-22 LAB — HEPATITIS B SURFACE ANTIGEN: Hepatitis B Surface Ag: NONREACTIVE

## 2019-09-22 LAB — HEPATITIS B SURFACE ANTIBODY, QUANTITATIVE: Hep B S AB Quant (Post): <5 m[IU]/mL — ABNORMAL LOW (ref >=10)

## 2019-09-22 LAB — RPR: RPR Ser Ql: NONREACTIVE

## 2019-09-22 LAB — HEPATITIS A ANTIBODY, TOTAL: Hepatitis A AB,Total: REACTIVE — AB

## 2019-09-24 ENCOUNTER — Encounter: Payer: Self-pay | Admitting: Infectious Diseases

## 2019-10-09 LAB — COMPREHENSIVE METABOLIC PANEL
AG Ratio: 1.5 (calc) (ref 1.0–2.5)
ALT: 31 U/L (ref 9–46)
AST: 18 U/L (ref 10–40)
Albumin: 4.3 g/dL (ref 3.6–5.1)
Alkaline phosphatase (APISO): 41 U/L (ref 36–130)
BUN: 8 mg/dL (ref 7–25)
CO2: 27 mmol/L (ref 20–32)
Calcium: 9.9 mg/dL (ref 8.6–10.3)
Chloride: 103 mmol/L (ref 98–110)
Creat: 0.73 mg/dL (ref 0.60–1.35)
Globulin: 2.8 g/dL (calc) (ref 1.9–3.7)
Glucose, Bld: 92 mg/dL (ref 65–99)
Potassium: 4.6 mmol/L (ref 3.5–5.3)
Sodium: 140 mmol/L (ref 135–146)
Total Bilirubin: 0.4 mg/dL (ref 0.2–1.2)
Total Protein: 7.1 g/dL (ref 6.1–8.1)

## 2019-10-09 LAB — HIV-1 INTEGRASE GENOTYPE

## 2019-10-09 LAB — HIV RNA, RTPCR W/R GT (RTI, PI,INT)
HIV 1 RNA Quant: 17900 copies/mL — ABNORMAL HIGH
HIV-1 RNA Quant, Log: 4.25 Log copies/mL — ABNORMAL HIGH

## 2019-10-09 LAB — HIV-1 GENOTYPE: HIV-1 Genotype: DETECTED — AB

## 2019-10-11 ENCOUNTER — Encounter: Payer: Self-pay | Admitting: Infectious Diseases

## 2019-12-06 ENCOUNTER — Other Ambulatory Visit: Payer: Self-pay

## 2019-12-06 MED ORDER — GENVOYA 150-150-200-10 MG PO TABS: 1.0000 | ORAL_TABLET | Freq: Every day | ORAL | 1 refills | Status: DC

## 2020-01-22 ENCOUNTER — Encounter: Payer: Self-pay | Admitting: Infectious Diseases

## 2020-01-23 ENCOUNTER — Encounter: Payer: Self-pay | Admitting: Infectious Diseases

## 2020-02-13 ENCOUNTER — Encounter: Payer: Self-pay | Admitting: Infectious Diseases

## 2020-02-13 ENCOUNTER — Ambulatory Visit (INDEPENDENT_AMBULATORY_CARE_PROVIDER_SITE_OTHER): Admitting: Infectious Diseases

## 2020-02-13 ENCOUNTER — Other Ambulatory Visit: Payer: Self-pay

## 2020-02-13 VITALS — BP 117/77 | HR 98 | Temp 98.7°F

## 2020-02-13 DIAGNOSIS — R7401 Elevation of levels of liver transaminase levels: Secondary | ICD-10-CM

## 2020-02-13 DIAGNOSIS — B20 Human immunodeficiency virus [HIV] disease: Secondary | ICD-10-CM

## 2020-02-13 DIAGNOSIS — Z23 Encounter for immunization: Secondary | ICD-10-CM | POA: Diagnosis not present

## 2020-02-13 NOTE — Progress Notes (Signed)
Name: Gary [name]  DOB: 1986/03/08 MRN: [id number] PCP: Patient, No Pcp Per    Patient Active Problem List   Diagnosis Date Noted  . Thrombocytopenia ([place]) 07/25/2019  . Transaminitis 07/25/2019  . Diarrhea 07/25/2019  . Traumatic subdural hematoma ([place]) 07/02/2019  . Alcohol abuse with alcohol-induced mood disorder ([place]) 05/13/2019  . Alcohol dependence ([place]) 04/16/2019  . Major depressive disorder, recurrent severe without psychotic features ([place]) 04/16/2019  . HIV disease ([place]) 06/16/2018  . Polysubstance abuse ([place]) 06/16/2018  . Cigarette smoker 06/16/2018     Brief Narrative:  Gary [name] is a 34 y.o. male with HIV disease, Dx 2018 in [place], [place].  CD4 nadir > 200 per report HIV Risk: MSM, drug use  History of OIs: none Intake Labs 09/2019: Hep B sAg (-), sAb (-), cAb (); Hep A (+), Hep C (-) Quantiferon (-) HLA B*5701 (not on file) G6PD: ()   Previous Regimens: Jorje Guild   Genotypes: . 09-2019: K103N, no integrase resistance predicted   Subjective:  CC: HIV follow up care.  No physical complaints, unhappy about still being incarcerated.      HPI: [name] is here today escorted by 2 guards from [facility] detention center. He last met with Dr. Prince Rome in November 2020 and was restarted on Genvoya at that time. VL 17,900 and CD4 320 at that visit, but had not had access to medications in a few months at that point.   He does not have a PCP but has access to healthcare through detention center presently. Has not had any illnesses recently or hospitalizations since last visit. He usually drinks alcohol and smokes cigarettes but has not had access to either in the past 6 months.   No side effects to Genvoya per his description. He is unhappy about current incarceration.     Review of Systems  Constitutional: Negative for chills, fever, malaise/fatigue and weight loss.  HENT: Negative for sore throat.        No dental problems  Respiratory: Negative  for cough and sputum production.   Cardiovascular: Negative for chest pain and leg swelling.  Gastrointestinal: Negative for abdominal pain, diarrhea and vomiting.  Genitourinary: Negative for dysuria and flank pain.  Musculoskeletal: Negative for joint pain, myalgias and neck pain.  Skin: Negative for rash.  Neurological: Negative for dizziness, tingling and headaches.  Psychiatric/Behavioral: Negative for depression and substance abuse. The patient is not nervous/anxious and does not have insomnia.     Past Medical History:  Diagnosis Date  . Alcohol abuse   . HIV (human immunodeficiency virus infection) ([place])   . Subdural hematoma (HCC)     Outpatient Medications Prior to Visit  Medication Sig Dispense Refill  . elvitegravir-cobicistat-emtricitabine-tenofovir (GENVOYA) 150-150-200-10 MG TABS tablet Take 1 tablet by mouth daily with breakfast. 30 tablet 1  . folic acid (FOLVITE) 1 MG tablet Take 1 tablet (1 mg total) by mouth daily. 30 tablet 2  . Multiple Vitamin (MULTIVITAMIN WITH MINERALS) TABS tablet Take 1 tablet by mouth daily. 30 tablet 2  . nicotine (NICODERM CQ - DOSED IN MG/24 HOURS) 21 mg/24hr patch Place 1 patch (21 mg total) onto the skin daily. 28 patch 0  . pantoprazole (PROTONIX) 40 MG tablet Take 1 tablet (40 mg total) by mouth daily. 30 tablet 2  . thiamine 100 MG tablet Take 1 tablet (100 mg total) by mouth daily. 30 tablet 2   No facility-administered medications prior to visit.     Allergies  Allergen  Reactions  . Caffeine Palpitations    Social History   Tobacco Use  . Smoking status: Former Smoker    Packs/day: 0.50    Types: Cigarettes  . Smokeless tobacco: Never Used  . Tobacco comment: unable to smoke while incarcerated 6+ months 02/13/20  Substance Use Topics  . Alcohol use: Not Currently    Alcohol/week: 3.0 standard drinks    Types: 3 Cans of beer per week    Comment: 24 oz  . Drug use: Not Currently    Types: Marijuana, Methamphetamines     Comment: last used 04/10/2019    Family History  Problem Relation Age of Onset  . Alcohol abuse Mother   . Alcohol abuse Father     Social History   Substance and Sexual Activity  Sexual Activity Not Currently   Comment: unable to offer condoms while incarcerated     Objective:   Vitals:   02/13/20 0940  BP: 117/77  Pulse: 98  Temp: 98.7 F (37.1 C)  TempSrc: Oral   There is no height or weight on file to calculate BMI.  Physical Exam Constitutional:      Appearance: He is well-developed.     Comments: Seated on exam table in no distress today.   HENT:     Mouth/Throat:     Dentition: Normal dentition. No dental abscesses.  Cardiovascular:     Rate and Rhythm: Normal rate and regular rhythm.     Heart sounds: Normal heart sounds.  Pulmonary:     Effort: Pulmonary effort is normal.     Breath sounds: Normal breath sounds.  Abdominal:     General: There is no distension.     Palpations: Abdomen is soft.     Tenderness: There is no abdominal tenderness.  Lymphadenopathy:     Cervical: No cervical adenopathy.  Skin:    General: Skin is warm and dry.     Findings: No rash.  Neurological:     Mental Status: He is alert and oriented to person, place, and time.  Psychiatric:        Judgment: Judgment normal.     Comments: Inappropriate laughter/comments throughout the visit.      Lab Results Lab Results  Component Value Date   WBC 3.2 (L) 09/19/2019   HGB 14.3 09/19/2019   HCT 41.5 09/19/2019   MCV 92.8 09/19/2019   PLT 195 09/19/2019    Lab Results  Component Value Date   CREATININE 0.73 09/19/2019   BUN 8 09/19/2019   NA 140 09/19/2019   K 4.6 09/19/2019   CL 103 09/19/2019   CO2 27 09/19/2019    Lab Results  Component Value Date   ALT 31 09/19/2019   AST 18 09/19/2019   ALKPHOS 68 07/30/2019   BILITOT 0.4 09/19/2019    Lab Results  Component Value Date   CHOL 135 09/19/2019   HDL 28 (L) 09/19/2019   LDLCALC 79 09/19/2019   TRIG 182  (H) 09/19/2019   CHOLHDL 4.8 09/19/2019   HIV 1 RNA Quant (copies/mL)  Date Value  02/13/2020 <20 NOT DETECTED  09/19/2019 17,900 (H)  06/15/2018 96,400   CD4 T Cell Abs (/uL)  Date Value  09/19/2019 320 (L)     Assessment & Plan:   Problem List Items Addressed This Visit      Unprioritized   Transaminitis    Resolved with cessation from alcohol use.       HIV disease ([place]) - Primary (Chronic)  Tolerating Genvoya well. He had labs done 2 weeks ago at the jail---unfortunately his VL was not able to be run as it was not frozen, but CD4 700. We informed him of the labs we knew of today but I suspect he is undetectable given adherence he receives lately.  Will check VL today here in the clinic.  Pneumococcal vaccine to be given today. He is not sure if he will get COVID vaccine.  Needs Hep B at upcoming appointments.       Relevant Orders   Pneumococcal polysaccharide vaccine 23-valent greater than or equal to 2yo subcutaneous/IM (Completed)   HIV-1 RNA quant-no reflex-bld (Completed)    Other Visit Diagnoses    Need for pneumococcal vaccination       Relevant Orders   Pneumococcal polysaccharide vaccine 23-valent greater than or equal to 2yo subcutaneous/IM (Completed)     RTC 6 months or upon release from jail to coordinate services here locally.    Janene Madeira, MSN, NP-C [facility] for [facility] [place] Pager: [phone] Office: [phone]  02/17/20  8:13 PM

## 2020-02-15 LAB — HIV-1 RNA QUANT-NO REFLEX-BLD
HIV 1 RNA Quant: <20 copies/mL
HIV-1 RNA Quant, Log: <1.3 Log copies/mL

## 2020-02-17 ENCOUNTER — Encounter: Payer: Self-pay | Admitting: Infectious Diseases

## 2020-02-17 NOTE — Assessment & Plan Note (Signed)
Tolerating Genvoya well. He had labs done 2 weeks ago at the jail---unfortunately his VL was not able to be run as it was not frozen, but CD4 700. We informed him of the labs we knew of today but I suspect he is undetectable given adherence he receives lately.  Will check VL today here in the clinic.  Pneumococcal vaccine to be given today. He is not sure if he will get COVID vaccine.  Needs Hep B at upcoming appointments.

## 2020-02-17 NOTE — Assessment & Plan Note (Signed)
Resolved with cessation from alcohol use.

## 2020-02-18 ENCOUNTER — Other Ambulatory Visit: Payer: Self-pay | Admitting: Internal Medicine

## 2020-04-05 ENCOUNTER — Other Ambulatory Visit: Payer: Self-pay | Admitting: Internal Medicine

## 2020-04-17 ENCOUNTER — Emergency Department (HOSPITAL_COMMUNITY): Payer: Self-pay

## 2020-04-17 ENCOUNTER — Other Ambulatory Visit: Payer: Self-pay

## 2020-04-17 ENCOUNTER — Encounter (HOSPITAL_COMMUNITY): Payer: Self-pay | Admitting: Emergency Medicine

## 2020-04-17 ENCOUNTER — Inpatient Hospital Stay (HOSPITAL_COMMUNITY)
Admission: AD | Admit: 2020-04-17 | Discharge: 2020-04-18 | DRG: 894 | Discharge status: Against Medical Advice | Payer: Self-pay | Attending: Internal Medicine | Admitting: Internal Medicine

## 2020-04-17 DIAGNOSIS — F10230 Alcohol dependence with withdrawal, uncomplicated: Secondary | ICD-10-CM

## 2020-04-17 DIAGNOSIS — F1024 Alcohol dependence with alcohol-induced mood disorder: Secondary | ICD-10-CM | POA: Diagnosis present

## 2020-04-17 DIAGNOSIS — D72819 Decreased white blood cell count, unspecified: Secondary | ICD-10-CM | POA: Diagnosis present

## 2020-04-17 DIAGNOSIS — R112 Nausea with vomiting, unspecified: Secondary | ICD-10-CM | POA: Diagnosis present

## 2020-04-17 DIAGNOSIS — D696 Thrombocytopenia, unspecified: Secondary | ICD-10-CM | POA: Diagnosis present

## 2020-04-17 DIAGNOSIS — Z20822 Contact with and (suspected) exposure to covid-19: Secondary | ICD-10-CM | POA: Diagnosis present

## 2020-04-17 DIAGNOSIS — Z811 Family history of alcohol abuse and dependence: Secondary | ICD-10-CM

## 2020-04-17 DIAGNOSIS — F10930 Alcohol use, unspecified with withdrawal, uncomplicated: Secondary | ICD-10-CM

## 2020-04-17 DIAGNOSIS — S065X9S Traumatic subdural hemorrhage with loss of consciousness of unspecified duration, sequela: Secondary | ICD-10-CM

## 2020-04-17 DIAGNOSIS — E162 Hypoglycemia, unspecified: Secondary | ICD-10-CM | POA: Diagnosis present

## 2020-04-17 DIAGNOSIS — F17210 Nicotine dependence, cigarettes, uncomplicated: Secondary | ICD-10-CM | POA: Diagnosis present

## 2020-04-17 DIAGNOSIS — F10231 Alcohol dependence with withdrawal delirium: Principal | ICD-10-CM | POA: Diagnosis present

## 2020-04-17 DIAGNOSIS — E871 Hypo-osmolality and hyponatremia: Secondary | ICD-10-CM | POA: Diagnosis present

## 2020-04-17 DIAGNOSIS — F1020 Alcohol dependence, uncomplicated: Secondary | ICD-10-CM | POA: Diagnosis present

## 2020-04-17 DIAGNOSIS — Z5329 Procedure and treatment not carried out because of patient's decision for other reasons: Secondary | ICD-10-CM | POA: Diagnosis present

## 2020-04-17 DIAGNOSIS — F10239 Alcohol dependence with withdrawal, unspecified: Secondary | ICD-10-CM | POA: Diagnosis present

## 2020-04-17 DIAGNOSIS — B20 Human immunodeficiency virus [HIV] disease: Secondary | ICD-10-CM | POA: Diagnosis present

## 2020-04-17 DIAGNOSIS — R0781 Pleurodynia: Secondary | ICD-10-CM | POA: Diagnosis present

## 2020-04-17 DIAGNOSIS — Z9109 Other allergy status, other than to drugs and biological substances: Secondary | ICD-10-CM

## 2020-04-17 DIAGNOSIS — Z21 Asymptomatic human immunodeficiency virus [HIV] infection status: Secondary | ICD-10-CM | POA: Diagnosis present

## 2020-04-17 DIAGNOSIS — F1911 Other psychoactive substance abuse, in remission: Secondary | ICD-10-CM | POA: Diagnosis present

## 2020-04-17 DIAGNOSIS — R748 Abnormal levels of other serum enzymes: Secondary | ICD-10-CM | POA: Diagnosis present

## 2020-04-17 LAB — URINALYSIS, ROUTINE W REFLEX MICROSCOPIC
Bacteria, UA: NONE SEEN
Bilirubin Urine: NEGATIVE
Glucose, UA: NEGATIVE mg/dL
Hgb urine dipstick: NEGATIVE
Ketones, ur: 20 mg/dL — AB
Leukocytes,Ua: NEGATIVE
Nitrite: NEGATIVE
Protein, ur: 100 mg/dL — AB
Specific Gravity, Urine: 1.026 (ref 1.005–1.030)
pH: 7 (ref 5.0–8.0)

## 2020-04-17 LAB — COMPREHENSIVE METABOLIC PANEL
ALT: 68 U/L — ABNORMAL HIGH (ref 0–44)
AST: 101 U/L — ABNORMAL HIGH (ref 15–41)
Albumin: 4.3 g/dL (ref 3.5–5.0)
Alkaline Phosphatase: 94 U/L (ref 38–126)
Anion gap: 16 — ABNORMAL HIGH (ref 5–15)
BUN: 10 mg/dL (ref 6–20)
CO2: 22 mmol/L (ref 22–32)
Calcium: 8 mg/dL — ABNORMAL LOW (ref 8.9–10.3)
Chloride: 100 mmol/L (ref 98–111)
Creatinine, Ser: 0.7 mg/dL (ref 0.61–1.24)
GFR calc Af Amer: >60 mL/min (ref >60)
GFR calc non Af Amer: >60 mL/min (ref >60)
Glucose, Bld: 94 mg/dL (ref 70–99)
Potassium: 4.3 mmol/L (ref 3.5–5.1)
Sodium: 138 mmol/L (ref 135–145)
Total Bilirubin: 1.4 mg/dL — ABNORMAL HIGH (ref 0.3–1.2)
Total Protein: 7.7 g/dL (ref 6.5–8.1)

## 2020-04-17 LAB — CBC
HCT: 46.5 % (ref 39.0–52.0)
Hemoglobin: 16 g/dL (ref 13.0–17.0)
MCH: 33.5 pg (ref 26.0–34.0)
MCHC: 34.4 g/dL (ref 30.0–36.0)
MCV: 97.5 fL (ref 80.0–100.0)
Platelets: 96 10*3/uL — ABNORMAL LOW (ref 150–400)
RBC: 4.77 MIL/uL (ref 4.22–5.81)
RDW: 13.4 % (ref 11.5–15.5)
WBC: 3.2 10*3/uL — ABNORMAL LOW (ref 4.0–10.5)
nRBC: 0 % (ref 0.0–0.2)

## 2020-04-17 LAB — SARS CORONAVIRUS 2 BY RT PCR (HOSPITAL ORDER, PERFORMED IN ~~LOC~~ HOSPITAL LAB): SARS Coronavirus 2: NEGATIVE

## 2020-04-17 LAB — ETHANOL: Alcohol, Ethyl (B): 214 mg/dL — ABNORMAL HIGH (ref <10)

## 2020-04-17 LAB — LIPASE, BLOOD: Lipase: 58 U/L — ABNORMAL HIGH (ref 11–51)

## 2020-04-17 LAB — SALICYLATE LEVEL: Salicylate Lvl: <7 mg/dL — ABNORMAL LOW (ref 7.0–30.0)

## 2020-04-17 LAB — ACETAMINOPHEN LEVEL: Acetaminophen (Tylenol), Serum: <10 ug/mL — ABNORMAL LOW (ref 10–30)
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
## 2020-04-17 MED ORDER — ADULT MULTIVITAMIN W/MINERALS CH
1.0000 | ORAL_TABLET | Freq: Every day | ORAL | Status: DC
  Administered 2020-04-18: 1 via ORAL
  Filled 2020-04-17: qty 1

## 2020-04-17 MED ORDER — THIAMINE HCL 100 MG/ML IJ SOLN
100.0000 mg | Freq: Every day | INTRAMUSCULAR | Status: DC
  Administered 2020-04-17: 100 mg via INTRAVENOUS
  Filled 2020-04-17: qty 2

## 2020-04-17 MED ORDER — SODIUM CHLORIDE 0.9 % IV BOLUS
1000.0000 mL | Freq: Once | INTRAVENOUS | Status: AC
  Administered 2020-04-17: 1000 mL via INTRAVENOUS

## 2020-04-17 MED ORDER — ELVITEG-COBIC-EMTRICIT-TENOFAF 150-150-200-10 MG PO TABS
1.0000 | ORAL_TABLET | Freq: Every day | ORAL | Status: DC
  Administered 2020-04-18: 1 via ORAL
  Filled 2020-04-17 (×2): qty 1

## 2020-04-17 MED ORDER — ONDANSETRON HCL 4 MG/2ML IJ SOLN
4.0000 mg | Freq: Once | INTRAMUSCULAR | Status: AC
  Administered 2020-04-17: 4 mg via INTRAVENOUS
  Filled 2020-04-17: qty 2

## 2020-04-17 MED ORDER — SODIUM CHLORIDE 0.9% FLUSH
3.0000 mL | Freq: Two times a day (BID) | INTRAVENOUS | Status: DC
  Administered 2020-04-18 (×2): 3 mL via INTRAVENOUS

## 2020-04-17 MED ORDER — ONDANSETRON HCL 4 MG PO TABS: 4.0000 mg | ORAL_TABLET | Freq: Four times a day (QID) | ORAL | Status: DC | PRN

## 2020-04-17 MED ORDER — HYDROXYZINE HCL 25 MG PO TABS: 25.0000 mg | ORAL_TABLET | Freq: Four times a day (QID) | ORAL | Status: DC | PRN

## 2020-04-17 MED ORDER — CHLORDIAZEPOXIDE HCL 25 MG PO CAPS: 25.0000 mg | ORAL_CAPSULE | Freq: Three times a day (TID) | ORAL | Status: DC

## 2020-04-17 MED ORDER — CHLORDIAZEPOXIDE HCL 25 MG PO CAPS
25.0000 mg | ORAL_CAPSULE | Freq: Four times a day (QID) | ORAL | Status: DC
  Administered 2020-04-17 – 2020-04-18 (×5): 25 mg via ORAL
  Filled 2020-04-17 (×5): qty 1

## 2020-04-17 MED ORDER — LORAZEPAM 1 MG PO TABS
1.0000 mg | ORAL_TABLET | ORAL | Status: DC | PRN
  Administered 2020-04-18 (×2): 1 mg via ORAL
  Administered 2020-04-18: 2 mg via ORAL
  Filled 2020-04-17 (×2): qty 1
  Filled 2020-04-17: qty 2

## 2020-04-17 MED ORDER — LORAZEPAM 2 MG/ML IJ SOLN: 0.0000 mg | Freq: Two times a day (BID) | INTRAMUSCULAR | Status: DC

## 2020-04-17 MED ORDER — LORAZEPAM 2 MG/ML IJ SOLN
1.0000 mg | INTRAMUSCULAR | Status: DC | PRN
  Administered 2020-04-18 (×3): 1 mg via INTRAVENOUS
  Administered 2020-04-18: 2 mg via INTRAVENOUS
  Filled 2020-04-17 (×4): qty 1

## 2020-04-17 MED ORDER — LORAZEPAM 2 MG/ML IJ SOLN
2.0000 mg | Freq: Once | INTRAMUSCULAR | Status: AC
  Administered 2020-04-17: 2 mg via INTRAVENOUS
  Filled 2020-04-17: qty 1

## 2020-04-17 MED ORDER — ACETAMINOPHEN 650 MG RE SUPP: 650.0000 mg | Freq: Four times a day (QID) | RECTAL | Status: DC | PRN

## 2020-04-17 MED ORDER — LOPERAMIDE HCL 2 MG PO CAPS: 2.0000 mg | ORAL_CAPSULE | ORAL | Status: DC | PRN

## 2020-04-17 MED ORDER — LORAZEPAM 2 MG/ML IJ SOLN
0.0000 mg | Freq: Four times a day (QID) | INTRAMUSCULAR | Status: DC
  Administered 2020-04-17: 2 mg via INTRAVENOUS
  Filled 2020-04-17: qty 1

## 2020-04-17 MED ORDER — ACETAMINOPHEN 325 MG PO TABS
650.0000 mg | ORAL_TABLET | Freq: Four times a day (QID) | ORAL | Status: DC | PRN
  Administered 2020-04-18: 650 mg via ORAL
  Filled 2020-04-17: qty 2

## 2020-04-17 MED ORDER — CHLORDIAZEPOXIDE HCL 25 MG PO CAPS: 25.0000 mg | ORAL_CAPSULE | ORAL | Status: DC

## 2020-04-17 MED ORDER — ONDANSETRON HCL 4 MG/2ML IJ SOLN
4.0000 mg | Freq: Four times a day (QID) | INTRAMUSCULAR | Status: DC | PRN
  Administered 2020-04-18: 4 mg via INTRAVENOUS
  Filled 2020-04-17: qty 2

## 2020-04-17 MED ORDER — THIAMINE HCL 100 MG PO TABS
100.0000 mg | ORAL_TABLET | Freq: Every day | ORAL | Status: DC
  Administered 2020-04-18: 100 mg via ORAL
  Filled 2020-04-17: qty 1

## 2020-04-17 MED ORDER — FOLIC ACID 1 MG PO TABS
1.0000 mg | ORAL_TABLET | Freq: Every day | ORAL | Status: DC
  Administered 2020-04-18: 1 mg via ORAL
  Filled 2020-04-17: qty 1

## 2020-04-17 MED ORDER — LACTATED RINGERS IV SOLN: INTRAVENOUS | Status: DC

## 2020-04-17 MED ORDER — LORAZEPAM 1 MG PO TABS: 0.0000 mg | ORAL_TABLET | Freq: Four times a day (QID) | ORAL | Status: DC

## 2020-04-17 MED ORDER — CHLORDIAZEPOXIDE HCL 25 MG PO CAPS: 25.0000 mg | ORAL_CAPSULE | Freq: Every day | ORAL | Status: DC

## 2020-04-17 MED ORDER — LORAZEPAM 1 MG PO TABS: 0.0000 mg | ORAL_TABLET | Freq: Two times a day (BID) | ORAL | Status: DC

## 2020-04-17 NOTE — ED Triage Notes (Addendum)
Per EMS, patient reports trying to decrease alcohol consumption, endorses drinking one 40oz today and states this is significantly less than he normally drinks. Reports N/V.  20g L FA NS 4mg  Zofran with EMS
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
## 2020-04-17 NOTE — ED Notes (Signed)
Pt informed that we need a urine sample. Urinal at bedside, pt states he will use the call bell when he is able to provide a sample.
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
## 2020-04-18 ENCOUNTER — Encounter (HOSPITAL_COMMUNITY): Payer: Self-pay | Admitting: Internal Medicine

## 2020-04-18 DIAGNOSIS — E871 Hypo-osmolality and hyponatremia: Secondary | ICD-10-CM

## 2020-04-18 DIAGNOSIS — B20 Human immunodeficiency virus [HIV] disease: Secondary | ICD-10-CM

## 2020-04-18 DIAGNOSIS — D696 Thrombocytopenia, unspecified: Secondary | ICD-10-CM

## 2020-04-18 DIAGNOSIS — E162 Hypoglycemia, unspecified: Secondary | ICD-10-CM

## 2020-04-18 DIAGNOSIS — R7989 Other specified abnormal findings of blood chemistry: Secondary | ICD-10-CM

## 2020-04-18 DIAGNOSIS — F1020 Alcohol dependence, uncomplicated: Secondary | ICD-10-CM

## 2020-04-18 LAB — COMPREHENSIVE METABOLIC PANEL
ALT: 60 U/L — ABNORMAL HIGH (ref 0–44)
AST: 104 U/L — ABNORMAL HIGH (ref 15–41)
Albumin: 3.8 g/dL (ref 3.5–5.0)
Alkaline Phosphatase: 81 U/L (ref 38–126)
Anion gap: 19 — ABNORMAL HIGH (ref 5–15)
BUN: 10 mg/dL (ref 6–20)
CO2: 18 mmol/L — ABNORMAL LOW (ref 22–32)
Calcium: 8.2 mg/dL — ABNORMAL LOW (ref 8.9–10.3)
Chloride: 97 mmol/L — ABNORMAL LOW (ref 98–111)
Creatinine, Ser: 0.73 mg/dL (ref 0.61–1.24)
GFR calc Af Amer: >60 mL/min (ref >60)
GFR calc non Af Amer: >60 mL/min (ref >60)
Glucose, Bld: 68 mg/dL — ABNORMAL LOW (ref 70–99)
Potassium: 3.9 mmol/L (ref 3.5–5.1)
Sodium: 134 mmol/L — ABNORMAL LOW (ref 135–145)
Total Bilirubin: 1.8 mg/dL — ABNORMAL HIGH (ref 0.3–1.2)
Total Protein: 7 g/dL (ref 6.5–8.1)

## 2020-04-18 LAB — MAGNESIUM: Magnesium: 1.8 mg/dL (ref 1.7–2.4)

## 2020-04-18 LAB — CBC
HCT: 40.3 % (ref 39.0–52.0)
Hemoglobin: 13.9 g/dL (ref 13.0–17.0)
MCH: 33.6 pg (ref 26.0–34.0)
MCHC: 34.5 g/dL (ref 30.0–36.0)
MCV: 97.3 fL (ref 80.0–100.0)
Platelets: 66 10*3/uL — ABNORMAL LOW (ref 150–400)
RBC: 4.14 MIL/uL — ABNORMAL LOW (ref 4.22–5.81)
RDW: 13.4 % (ref 11.5–15.5)
WBC: 3.3 10*3/uL — ABNORMAL LOW (ref 4.0–10.5)
nRBC: 0 % (ref 0.0–0.2)

## 2020-04-18 LAB — CBG MONITORING, ED: Glucose-Capillary: 64 mg/dL — ABNORMAL LOW (ref 70–99)

## 2020-04-18 LAB — PHOSPHORUS: Phosphorus: 3.3 mg/dL (ref 2.5–4.6)

## 2020-04-18 MED ORDER — CHLORHEXIDINE GLUCONATE CLOTH 2 % EX PADS
6.0000 | MEDICATED_PAD | Freq: Every day | CUTANEOUS | Status: DC
  Administered 2020-04-18: 6 via TOPICAL

## 2020-04-18 MED ORDER — DEXTROSE-NACL 5-0.9 % IV SOLN: INTRAVENOUS | Status: DC
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
## 2020-04-18 NOTE — ED Notes (Signed)
Gary Miyamoto, MD paged on amion for low CBG.

## 2020-04-18 NOTE — ED Notes (Signed)
Pt provided with meal tray. Assisted pt with sitting up tray.

## 2020-04-18 NOTE — ED Notes (Signed)
Patient given meal tray and Orange Juice.

## 2020-04-18 NOTE — ED Notes (Signed)
Patient encouraged to eat and drink OJ for low CBG.

## 2020-04-18 NOTE — Progress Notes (Signed)
Entered patient's room after discovering he was off the monitor. Found patient walking around in room. When patient was asked why he removed himself from the heart monitor he stated that he was ready to leave. Asked patient if he was talking about leaving against medical advice and he confirmed that he was. Patient voiced frustration because he was told that he would only be admitted to the hospital for 24 hours, but it was over 24 hours since he was originally told that by the doctor. Counseled patient and informed him that although we do our best to treat him and get him home in a safe and timely manner, some disease processes are complex, his included, and they often require a multi-day admission. Patient calmly stated that he understood, but still requested to leave. Patient is completely alert and oriented upon assessment and correctly answered the date, time, place, his name, and even the situation. Paged on-call hospitalist [facility] to let him know, informed AC, and let primary nurse know. Patient was curious about the effects of medications that he had. Patient was medicated with 25 mg of PO librium at 2112 and 2 mg of IV ativan at 2032. Informed patient that there could indeed be dangerous side effects of leaving after he's had multiple sedative medications no matter the dose including blood pressure and respiratory issues. Encouraged him to stay, but informed him that we cannot force him to stay as he has rights as a patient and is in sound mind. Encouraged him to be careful, slow, steady, and intentional with ambulation and movement so that he would not fall and potentially hurt himself. Informed patient multiple times that he is leaving against medical advice so no health care practitioner has deemed him truly safe to leave the hospital at this time and he is totally leaving on his own free will. Patient is sick and withdrawing from alcohol, encouraged him to come back at any time if he became worried  about his symptoms again or if his symptoms worsened. Patient voiced understanding with all teaching. IV access removed from right arm, patient dressed himself voluntarily ambulated downstairs for self discharge.

## 2020-04-19 LAB — MRSA PCR SCREENING: MRSA by PCR: POSITIVE — AB

## 2020-04-22 NOTE — Discharge Summary (Signed)
AMA Notice.  Per nursing at 2152 hrs on 6/10:  Entered patient's room after discovering he was off the monitor. Found patient walking around in room. When patient was asked why he removed himself from the heart monitor he stated that he was ready to leave. Asked patient if he was talking about leaving against medical advice and he confirmed that he was. Patient voiced frustration because he was told that he would only be admitted to the hospital for 24 hours, but it was over 24 hours since he was originally told that by the doctor. Counseled patient and informed him that although we do our best to treat him and get him home in a safe and timely manner, some disease processes are complex, his included, and they often require a multi-day admission. Patient calmly stated that he understood, but still requested to leave. Patient is completely alert and oriented upon assessment and correctly answered the date, time, place, his name, and even the situation. Paged on-call hospitalist [facility] to let him know, informed AC, and let primary nurse know. Patient was curious about the effects of medications that he had. Patient was medicated with 25 mg of PO librium at 2112 and 2 mg of IV ativan at 2032. Informed patient that there could indeed be dangerous side effects of leaving after he's had multiple sedative medications no matter the dose including blood pressure and respiratory issues. Encouraged him to stay, but informed him that we cannot force him to stay as he has rights as a patient and is in sound mind. Encouraged him to be careful, slow, steady, and intentional with ambulation and movement so that he would not fall and potentially hurt himself. Informed patient multiple times that he is leaving against medical advice so no health care practitioner has deemed him truly safe to leave the hospital at this time and he is totally leaving on his own free will. Patient is sick and withdrawing from alcohol, encouraged  him to come back at any time if he became worried about his symptoms again or if his symptoms worsened. Patient voiced understanding with all teaching. IV access removed from right arm, patient dressed himself voluntarily ambulated downstairs for self discharge.    Final Diagnosis: Alcohol use disorder with dependence and acute withdrawal Elevated liver enzymes thrombocytopenia leukopenia: HIV Hypoglycemia Hyponatremia  Patient left AMA overnight. No exam performed. Recommend he follow up with PCP.   Marland KitchenTeddy Spike, DO

## 2020-05-04 ENCOUNTER — Inpatient Hospital Stay (HOSPITAL_COMMUNITY)
Admission: EM | Admit: 2020-05-04 | Discharge: 2020-05-09 | DRG: 640 | Disposition: A | Discharge status: Home / Self Care | Payer: Self-pay | Attending: Internal Medicine | Admitting: Internal Medicine

## 2020-05-04 ENCOUNTER — Other Ambulatory Visit: Payer: Self-pay

## 2020-05-04 ENCOUNTER — Emergency Department (HOSPITAL_COMMUNITY): Payer: Self-pay

## 2020-05-04 ENCOUNTER — Encounter (HOSPITAL_COMMUNITY): Payer: Self-pay | Admitting: Emergency Medicine

## 2020-05-04 DIAGNOSIS — Z87891 Personal history of nicotine dependence: Secondary | ICD-10-CM

## 2020-05-04 DIAGNOSIS — Z87820 Personal history of traumatic brain injury: Secondary | ICD-10-CM

## 2020-05-04 DIAGNOSIS — E872 Acidosis: Principal | ICD-10-CM | POA: Diagnosis present

## 2020-05-04 DIAGNOSIS — Z811 Family history of alcohol abuse and dependence: Secondary | ICD-10-CM

## 2020-05-04 DIAGNOSIS — E440 Moderate protein-calorie malnutrition: Secondary | ICD-10-CM | POA: Diagnosis present

## 2020-05-04 DIAGNOSIS — Z20822 Contact with and (suspected) exposure to covid-19: Secondary | ICD-10-CM | POA: Diagnosis present

## 2020-05-04 DIAGNOSIS — E8729 Other acidosis: Secondary | ICD-10-CM | POA: Diagnosis present

## 2020-05-04 DIAGNOSIS — F10229 Alcohol dependence with intoxication, unspecified: Secondary | ICD-10-CM | POA: Diagnosis present

## 2020-05-04 DIAGNOSIS — F1020 Alcohol dependence, uncomplicated: Secondary | ICD-10-CM | POA: Diagnosis present

## 2020-05-04 DIAGNOSIS — E876 Hypokalemia: Secondary | ICD-10-CM | POA: Diagnosis present

## 2020-05-04 DIAGNOSIS — K922 Gastrointestinal hemorrhage, unspecified: Secondary | ICD-10-CM

## 2020-05-04 DIAGNOSIS — Z21 Asymptomatic human immunodeficiency virus [HIV] infection status: Secondary | ICD-10-CM | POA: Diagnosis present

## 2020-05-04 DIAGNOSIS — K2971 Gastritis, unspecified, with bleeding: Secondary | ICD-10-CM | POA: Diagnosis present

## 2020-05-04 DIAGNOSIS — Y908 Blood alcohol level of 240 mg/100 ml or more: Secondary | ICD-10-CM | POA: Diagnosis present

## 2020-05-04 DIAGNOSIS — R509 Fever, unspecified: Secondary | ICD-10-CM | POA: Diagnosis present

## 2020-05-04 DIAGNOSIS — F10230 Alcohol dependence with withdrawal, uncomplicated: Secondary | ICD-10-CM | POA: Diagnosis present

## 2020-05-04 DIAGNOSIS — K921 Melena: Secondary | ICD-10-CM | POA: Diagnosis present

## 2020-05-04 DIAGNOSIS — K7010 Alcoholic hepatitis without ascites: Secondary | ICD-10-CM | POA: Diagnosis present

## 2020-05-04 DIAGNOSIS — K2091 Esophagitis, unspecified with bleeding: Secondary | ICD-10-CM | POA: Diagnosis present

## 2020-05-04 DIAGNOSIS — B20 Human immunodeficiency virus [HIV] disease: Secondary | ICD-10-CM | POA: Diagnosis present

## 2020-05-04 DIAGNOSIS — F10930 Alcohol use, unspecified with withdrawal, uncomplicated: Secondary | ICD-10-CM

## 2020-05-04 LAB — CBC
HCT: 48 % (ref 39.0–52.0)
Hemoglobin: 16.1 g/dL (ref 13.0–17.0)
MCH: 33.9 pg (ref 26.0–34.0)
MCHC: 33.5 g/dL (ref 30.0–36.0)
MCV: 101.1 fL — ABNORMAL HIGH (ref 80.0–100.0)
Platelets: 278 10*3/uL (ref 150–400)
RBC: 4.75 MIL/uL (ref 4.22–5.81)
RDW: 14.9 % (ref 11.5–15.5)
WBC: 6.6 10*3/uL (ref 4.0–10.5)
nRBC: 0 % (ref 0.0–0.2)

## 2020-05-04 LAB — PROTIME-INR
INR: 1 (ref 0.8–1.2)
Prothrombin Time: 12.9 seconds (ref 11.4–15.2)

## 2020-05-04 LAB — POC OCCULT BLOOD, ED: Fecal Occult Bld: POSITIVE — AB

## 2020-05-04 LAB — ETHANOL: Alcohol, Ethyl (B): 280 mg/dL — ABNORMAL HIGH (ref <10)

## 2020-05-04 IMAGING — DX DG CHEST 1V PORT
1 series · 1 of 1 positions shown · non-contrast
Comparison: None.

CLINICAL DATA: Cough

EXAM:
PORTABLE CHEST 1 VIEW

[chest ap]
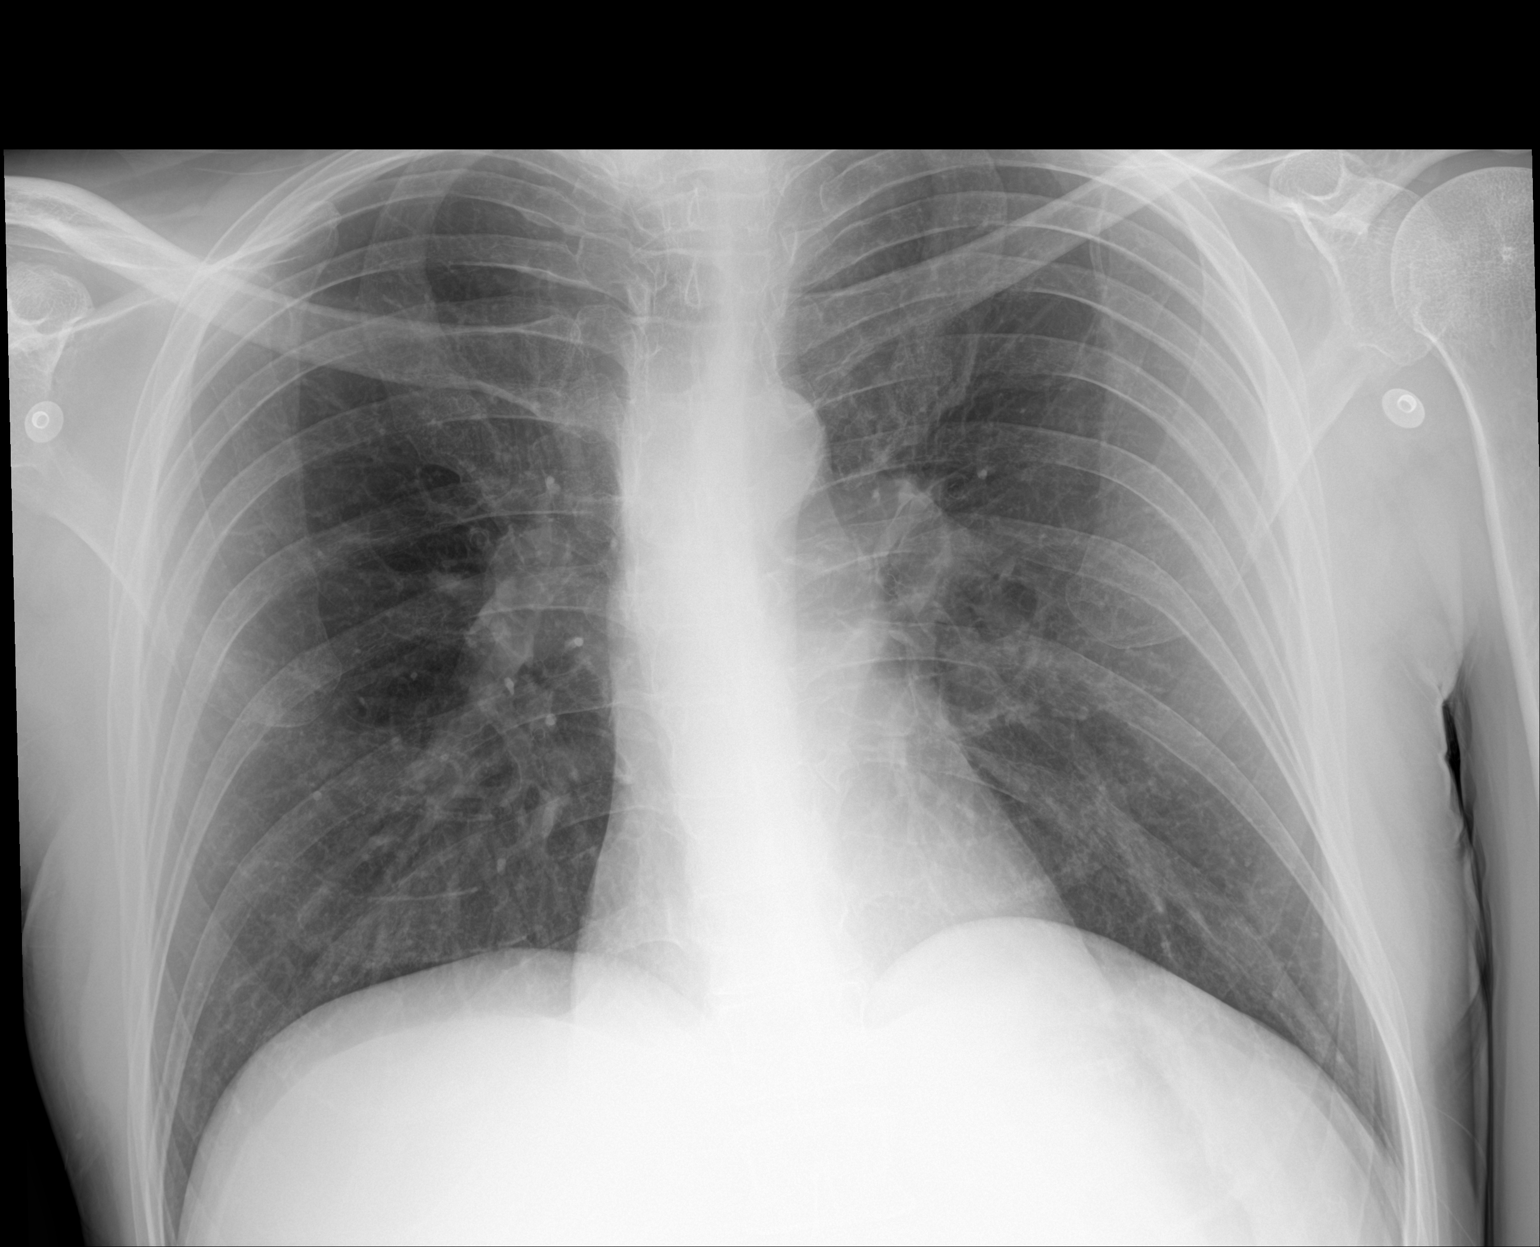

[1 of 1 positions shown; findings below may reference images not displayed]

FINDINGS: The heart size and mediastinal contours are within normal limits.
Both lungs are clear. The visualized skeletal structures are
unremarkable.
IMPRESSION: No active disease.

## 2020-05-04 MED ORDER — VANCOMYCIN HCL 1500 MG/300ML IV SOLN
1500.0000 mg | Freq: Once | INTRAVENOUS | Status: AC
  Administered 2020-05-05: 1500 mg via INTRAVENOUS
  Filled 2020-05-04: qty 300

## 2020-05-04 MED ORDER — SODIUM CHLORIDE 0.9 % IV SOLN
2.0000 g | Freq: Once | INTRAVENOUS | Status: AC
  Administered 2020-05-04: 2 g via INTRAVENOUS
  Filled 2020-05-04: qty 2

## 2020-05-04 MED ORDER — METRONIDAZOLE IN NACL 5-0.79 MG/ML-% IV SOLN
500.0000 mg | Freq: Once | INTRAVENOUS | Status: AC
  Administered 2020-05-04: 500 mg via INTRAVENOUS
  Filled 2020-05-04: qty 100

## 2020-05-04 MED ORDER — SODIUM CHLORIDE 0.9 % IV BOLUS: 1000.0000 mL | Freq: Once | INTRAVENOUS | Status: DC

## 2020-05-04 MED ORDER — THIAMINE HCL 100 MG/ML IJ SOLN
100.0000 mg | Freq: Every day | INTRAMUSCULAR | Status: DC
  Administered 2020-05-06 – 2020-05-08 (×3): 100 mg via INTRAVENOUS
  Filled 2020-05-04 (×4): qty 2

## 2020-05-04 MED ORDER — LORAZEPAM 2 MG/ML IJ SOLN
0.0000 mg | Freq: Two times a day (BID) | INTRAMUSCULAR | Status: AC
  Administered 2020-05-07: 4 mg via INTRAVENOUS
  Filled 2020-05-04: qty 2

## 2020-05-04 MED ORDER — ACETAMINOPHEN 10 MG/ML IV SOLN
1000.0000 mg | Freq: Four times a day (QID) | INTRAVENOUS | Status: AC
  Administered 2020-05-05 (×4): 1000 mg via INTRAVENOUS
  Filled 2020-05-04 (×5): qty 100

## 2020-05-04 MED ORDER — LORAZEPAM 2 MG/ML IJ SOLN
0.0000 mg | Freq: Four times a day (QID) | INTRAMUSCULAR | Status: AC
  Administered 2020-05-04 – 2020-05-05 (×4): 4 mg via INTRAVENOUS
  Administered 2020-05-05 – 2020-05-06 (×2): 2 mg via INTRAVENOUS
  Administered 2020-05-06: 4 mg via INTRAVENOUS
  Administered 2020-05-06: 2 mg via INTRAVENOUS
  Filled 2020-05-04 (×2): qty 2
  Filled 2020-05-04 (×2): qty 1
  Filled 2020-05-04 (×2): qty 2
  Filled 2020-05-04: qty 1
  Filled 2020-05-04: qty 2

## 2020-05-04 MED ORDER — SODIUM CHLORIDE 0.9 % IV SOLN
80.0000 mg | Freq: Once | INTRAVENOUS | Status: AC
  Administered 2020-05-05: 80 mg via INTRAVENOUS
  Filled 2020-05-04: qty 80

## 2020-05-04 MED ORDER — THIAMINE HCL 100 MG PO TABS
100.0000 mg | ORAL_TABLET | Freq: Every day | ORAL | Status: DC
  Administered 2020-05-05: 100 mg via ORAL
  Filled 2020-05-04 (×2): qty 1

## 2020-05-04 MED ORDER — LORAZEPAM 1 MG PO TABS: 0.0000 mg | ORAL_TABLET | Freq: Four times a day (QID) | ORAL | Status: AC

## 2020-05-04 MED ORDER — SODIUM CHLORIDE 0.9 % IV SOLN
8.0000 mg/h | INTRAVENOUS | Status: DC
  Administered 2020-05-04: 8 mg/h via INTRAVENOUS
  Filled 2020-05-04 (×2): qty 80

## 2020-05-04 MED ORDER — SODIUM CHLORIDE 0.9 % IV BOLUS
30.0000 mL/kg | Freq: Once | INTRAVENOUS | Status: AC
  Administered 2020-05-04: 2190 mL via INTRAVENOUS

## 2020-05-04 MED ORDER — VANCOMYCIN HCL IN DEXTROSE 1-5 GM/200ML-% IV SOLN: 1000.0000 mg | Freq: Once | INTRAVENOUS | Status: DC

## 2020-05-04 MED ORDER — LORAZEPAM 1 MG PO TABS
0.0000 mg | ORAL_TABLET | Freq: Two times a day (BID) | ORAL | Status: AC
  Administered 2020-05-07: 4 mg via ORAL
  Administered 2020-05-08: 1 mg via ORAL
  Administered 2020-05-08: 2 mg via ORAL
  Filled 2020-05-04 (×2): qty 2
  Filled 2020-05-04: qty 4

## 2020-05-04 NOTE — ED Provider Notes (Addendum)
[facility]-EMERGENCY DEPT Provider Note   CSN: [id number] Arrival date & time: 05/04/20  2214     History Chief Complaint  Patient presents with  . Withdrawal    Gary [name] is a 34 y.o. male with a history of HIV, alcohol abuse, traumatic subdural hematoma, thrombocytopenia, and depression who presents to the emergency department with primary complaint of alcohol withdrawal over the past 3-4 days. Patient relays that he drinks alcohol daily, approximately 1-2 fifths of vodka per day, states he has almost daily vomiting which he attributes to withdrawal if he is not drinking however over the past 3-4 days this has gotten significantly worse. He has vomited too numerous times to count with associated generalized abdominal discomfort described as a burning pain, melena, chills, feeling overheated, generalized weakness, cough, and anxiety. He states alcohol sometimes alleviates his sxs, but he has been unable to keep it down today, also not able to keep down any of his medications. He has not had a fever at home that he is aware of. He denies hematemesis, hallucinations, seizures, syncope, or dyspnea. He has not previously had an endoscopy procedure. Denies NSAID or anticoagulation use.   HPI     Past Medical History:  Diagnosis Date  . Alcohol abuse   . HIV (human immunodeficiency virus infection) (HCC)   . Subdural hematoma [facility])     Patient Active Problem List   Diagnosis Date Noted  . Alcohol withdrawal (HCC) 04/17/2020  . Thrombocytopenia (HCC) 07/25/2019  . Transaminitis 07/25/2019  . Diarrhea 07/25/2019  . Traumatic subdural hematoma (HCC) 07/02/2019  . Alcohol abuse with alcohol-induced mood disorder (HCC) 05/13/2019  . Alcohol dependence (HCC) 04/16/2019  . 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 HIV disease (HCC) 06/16/2018  . Polysubstance abuse (HCC) 06/16/2018  . Cigarette smoker 06/16/2018     Past Surgical History:  Procedure Laterality Date  . INCISION AND DRAINAGE PERIRECTAL ABSCESS N/A 09/24/2016   Procedure: IRRIGATION AND DEBRIDEMENT PERIRECTAL ABSCESS;  Surgeon: Ricarda Frame, MD;  Location: [facility] ORS;  Service: General;  Laterality: N/A;  . none         Family History  Problem Relation Age of Onset  . Alcohol abuse Mother   . Alcohol abuse Father     Social History   Tobacco Use  . Smoking status: Former Smoker    Packs/day: 0.50    Types: Cigarettes  . Smokeless tobacco: Never Used  . Tobacco comment: unable to smoke while incarcerated 6+ months 02/13/20  Vaping Use  . Vaping Use: Never used  Substance Use Topics  . Alcohol use: Not Currently    Alcohol/week: 3.0 standard drinks    Types: 3 Cans of beer per week    Comment: 24 oz  . Drug use: Not Currently    Types: Marijuana, Methamphetamines    Comment: last used 04/10/2019    Home Medications Prior to Admission medications   Medication Sig Start Date End Date Taking? Authorizing Provider  folic acid (FOLVITE) 1 MG tablet Take 1 tablet (1 mg total) by mouth daily. Patient not taking: Reported on 04/17/2020 07/31/19   Shon Hale, MD  GENVOYA 150-150-200-10 MG TABS tablet TAKE 1 TABLET BY MOUTH DAILY WITH BREAKFAST 04/05/20   Cliffton Asters, MD  Multiple Vitamin (MULTIVITAMIN WITH MINERALS) TABS tablet Take 1 tablet by mouth daily. Patient not taking: Reported on 04/17/2020 07/31/19   Shon Hale, MD  nicotine (NICODERM CQ - DOSED IN MG/24 HOURS) 21  mg/24hr patch Place 1 patch (21 mg total) onto the skin daily. Patient not taking: Reported on 04/17/2020 07/31/19   Roxan Hockey, MD  pantoprazole (PROTONIX) 40 MG tablet Take 1 tablet (40 mg total) by mouth daily. Patient not taking: Reported on 04/17/2020 07/31/19   Roxan Hockey, MD  thiamine 100 MG tablet Take 1 tablet (100 mg total) by mouth daily. Patient not taking: Reported on 04/17/2020 07/31/19   Roxan Hockey, MD  famotidine (PEPCID)  20 MG tablet Take 1 tablet (20 mg total) by mouth 2 (two) times daily. Patient not taking: Reported on 06/01/2019 05/14/19 06/02/19  Davonna Belling, MD    Allergies    Caffeine  Review of Systems   Review of Systems  Constitutional: Positive for chills and fatigue. Negative for fever.  Respiratory: Positive for cough. Negative for shortness of breath.   Gastrointestinal: Positive for abdominal pain, blood in stool, nausea and vomiting.  Genitourinary: Negative for dysuria, scrotal swelling and testicular pain.  Neurological: Positive for weakness. Negative for seizures and syncope.  All other systems reviewed and are negative.   Physical Exam Updated Vital Signs BP (!) 133/91   Pulse (!) 117   Temp 100.1 F (37.8 C) (Rectal)   Resp 16   SpO2 100%   Physical Exam Vitals and nursing note reviewed. Exam conducted with a chaperone present.  Constitutional:      Appearance: He is well-developed. He is ill-appearing.     Comments: Warm to the touch, somewhat flushed appearing.   HENT:     Head: Normocephalic and atraumatic.     Mouth/Throat:     Mouth: Mucous membranes are dry.  Eyes:     General:        Right eye: No discharge.        Left eye: No discharge.     Pupils: Pupils are equal, round, and reactive to light.  Cardiovascular:     Rate and Rhythm: Regular rhythm. Tachycardia present.  Pulmonary:     Effort: Pulmonary effort is normal. No respiratory distress.     Breath sounds: Normal breath sounds. No wheezing, rhonchi or rales.  Abdominal:     General: There is no distension.     Palpations: Abdomen is soft.     Tenderness: There is abdominal tenderness (mild generalized). There is no guarding or rebound.  Genitourinary:    Comments: DRE with melena present.  Musculoskeletal:     Cervical back: Neck supple. No rigidity.  Skin:    General: Skin is warm and dry.     Findings: No rash.  Neurological:     Mental Status: He is alert.     Comments: Clear speech.    Psychiatric:        Mood and Affect: Mood is anxious.    ED Results / Procedures / Treatments   Labs (all labs ordered are listed, but only abnormal results are displayed) Labs Reviewed  CBC - Abnormal; Notable for the following components:      Result Value   MCV 101.1 (*)    All other components within normal limits  ETHANOL - Abnormal; Notable for the following components:   Alcohol, Ethyl (B) 280 (*)    All other components within normal limits  POC OCCULT BLOOD, ED - Abnormal; Notable for the following components:   Fecal Occult Bld POSITIVE (*)    All other components within normal limits  CULTURE, BLOOD (ROUTINE X 2)  CULTURE, BLOOD (ROUTINE X 2)  URINE CULTURE  SARS CORONAVIRUS 2 BY RT PCR (HOSPITAL ORDER, PERFORMED IN [facility])  PROTIME-INR  LIPASE, BLOOD  COMPREHENSIVE METABOLIC PANEL  URINALYSIS, ROUTINE W REFLEX MICROSCOPIC  LACTIC ACID, PLASMA  LACTIC ACID, PLASMA  RAPID URINE DRUG SCREEN, HOSP PERFORMED  APTT  TYPE AND SCREEN    EKG EKG Interpretation  Date/Time:  Saturday May 04 2020 23:51:38 EDT Ventricular Rate:  132 PR Interval:    QRS Duration: 100 QT Interval:  324 QTC Calculation: 481 R Axis:   78 Text Interpretation: Sinus tachycardia Anterolateral infarct, age indeterminate No significant change since last tracing other than rate is faster Confirmed by Rochele Raring [id number]) on 05/05/2020 12:15:24 AM   Radiology DG Chest Portable 1 View  Result Date: 05/04/2020 CLINICAL DATA:  Cough EXAM: PORTABLE CHEST 1 VIEW COMPARISON:  None. FINDINGS: The heart size and mediastinal contours are within normal limits. Both lungs are clear. The visualized skeletal structures are unremarkable. IMPRESSION: No active disease. Electronically Signed   By: Charlett Nose M.D.   On: 05/04/2020 23:15    Procedures .Critical Care Performed by: Cherly Anderson, PA-C Authorized by: Cherly Anderson, PA-C     CRITICAL CARE Performed  by: Harvie Heck   Total critical care time: 50 minutes  Critical care time was exclusive of separately billable procedures and treating other patients.  Critical care was necessary to treat or prevent imminent or life-threatening deterioration.  Critical care was time spent personally by me on the following activities: development of treatment plan with patient and/or surrogate as well as nursing, discussions with consultants, evaluation of patient's response to treatment, examination of patient, obtaining history from patient or surrogate, ordering and performing treatments and interventions, ordering and review of laboratory studies, ordering and review of radiographic studies, pulse oximetry and re-evaluation of patient's condition.   (including critical care time)  Medications Ordered in ED Medications - No data to display  ED Course  I have reviewed the triage vital signs and the nursing notes.  Pertinent labs & imaging results that were available during my care of the patient were reviewed by me and considered in my medical decision making (see chart for details).    MDM Rules/Calculators/A&P                         Patient presents to the ED with complaints of withdrawal primarily.  He is ill appearing, tachycardic, rectal temp is somewhat elevated @ 100.1, not overly hyper or hypotensive. He appears anxious, flushed, has generalized abdominal tenderness without peritoneal signs, and melena on DRE. Concern alcohol withdrawal, GI bleed, and possible sepsis. Placed on CIWA protocol. Protonix bolus & drip initiated. Broad spectrum abx & 30 cc/kg bolus ordered, will give IV Tylenol for temperature as patient is not tolerating PO and is having blood in stool, avoiding NSAIDs given GI bleed.   Additional history obtained:  Additional history obtained from chart & nursing note review. Previous records obtained and reviewed. No prior upper/lower endoscopy.   Lab Tests:  I  Ordered, reviewed, and interpreted labs, which included:  CBC: No significant anemia or leukocytosis.  Platelets are within normal limits. CMP: Elevated LFTs which are mildly worsening compared to prior.  Patient has an elevated anion gap acidosis which I suspect is secondary to dehydration/alcoholic ketoacidosis. Lipase: Mildly elevated PT/INR/APTT: Within normal limits Ethanol level: Elevated, however has been higher in the past. Fecal occult testing: Positive Lactic acid: Elevated at 6.5 UA: no UTI  COVID: Negative  Imaging Studies ordered:  I ordered imaging studies which included CXR, I independently visualized and interpreted imaging which showed no acute process.   ED Course:  Fecal occult positive, melena present, however hemoglobin and hematocrit are reassuring.  Patient receiving Protonix bolus and infusion.  Initial concern for sepsis due to temp of 100.1, hot to the touch, and tachycardia, however patient without identifiable source of infection at this time, chest x-ray without pneumonia, urinalysis without UTI, no meningismus, no focal abdominal tenderness, while his lactic acid is elevated at 6.5, favor this to be more due to dehydration as opposed to sepsis his tachycardia to be more related to alcohol withdrawal.  He is on CIWA protocol.  He will need admission for withdrawal, alcoholic ketoacidosis, dehydration, and GI bleed.  His vitals are improving with interventions in the emergency department.   02:45: CONSULT: Discussed with hospitalist Dr. Julian Reil- accepts admission.   Patient's lactic acid is downtrending @ 3.6.  Sepsis - Repeat Assessment Performed at: 0300:  Vitals Blood pressure 117/78, pulse 92, temperature 100.1 F (37.8 C), temperature source Rectal, resp. rate 20, height 6\' 1"  (1.854 m), weight 73 kg, SpO2 98 %. Heart: Regular rate and rhythm Lungs:CTA Capillary Refill:<2 sec Peripheral Pulse:Radial pulse palpable Skin:Dry  Findings and plan of care  discussed with supervising physician Dr. who is in agreement.   Portions of this note were generated with Elesa Massed. Dictation errors may occur despite best attempts at proofreading.  Final Clinical Impression(s) / ED Diagnoses Final diagnoses:  Alcoholic ketoacidosis  Gastrointestinal hemorrhage, unspecified gastrointestinal hemorrhage type  Alcohol withdrawal syndrome without complication [facility])    Rx / DC Orders ED Discharge Orders    None       [facility], PA-C 05/05/20 05/07/20  NORMA IGNASIAK was evaluated in Emergency Department on 05/05/2020 for the symptoms described in the history of present illness. He/she was evaluated in the context of the global COVID-19 pandemic, which necessitated consideration that the patient might be at risk for infection with the SARS-CoV-2 virus that causes COVID-19. Institutional protocols and algorithms that pertain to the evaluation of patients at risk for COVID-19 are in a state of rapid change based on information released by regulatory bodies including the CDC and federal and state organizations. These policies and algorithms were followed during the patient's care in the ED.     05/07/2020, PA-C 05/05/20 0414    Ward, 05/07/20, DO 05/05/20 [phone]

## 2020-05-04 NOTE — Progress Notes (Signed)
A consult was received from an ED provider for cefepime and vancomycin per pharmacy dosing.  The patient's profile has been reviewed for ht/wt/allergies/indication/available labs.    Allergies  Allergen Reactions  . Caffeine Palpitations   A one time order has been placed for Cefepime 2 g IV once + vancomycin 1500 mg IV once.    Further antibiotics/pharmacy consults should be ordered by admitting physician if indicated.                       Thank you, Cindi Carbon, PharmD 05/04/2020  11:38 PM

## 2020-05-04 NOTE — ED Triage Notes (Signed)
[place] reports alcohol withdrawal, unable to tolerate PO x2 days. C/o weakness, N/V. +orthostatics  HR 110 1000NS with EMS 4mg  Zofran 18g L FA

## 2020-05-05 ENCOUNTER — Other Ambulatory Visit: Payer: Self-pay

## 2020-05-05 DIAGNOSIS — E8729 Other acidosis: Secondary | ICD-10-CM | POA: Diagnosis present

## 2020-05-05 DIAGNOSIS — F10220 Alcohol dependence with intoxication, uncomplicated: Secondary | ICD-10-CM

## 2020-05-05 DIAGNOSIS — E872 Acidosis: Secondary | ICD-10-CM | POA: Diagnosis present

## 2020-05-05 DIAGNOSIS — R509 Fever, unspecified: Secondary | ICD-10-CM

## 2020-05-05 DIAGNOSIS — K921 Melena: Secondary | ICD-10-CM | POA: Diagnosis present

## 2020-05-05 LAB — URINALYSIS, ROUTINE W REFLEX MICROSCOPIC
Bacteria, UA: NONE SEEN
Bilirubin Urine: NEGATIVE
Glucose, UA: NEGATIVE mg/dL
Ketones, ur: 80 mg/dL — AB
Leukocytes,Ua: NEGATIVE
Nitrite: NEGATIVE
Protein, ur: 100 mg/dL — AB
Specific Gravity, Urine: 1.021 (ref 1.005–1.030)
pH: 5 (ref 5.0–8.0)

## 2020-05-05 LAB — BASIC METABOLIC PANEL
Anion gap: 13 (ref 5–15)
Anion gap: 12 (ref 5–15)
Anion gap: 10 (ref 5–15)
BUN: 12 mg/dL (ref 6–20)
BUN: 11 mg/dL (ref 6–20)
BUN: 11 mg/dL (ref 6–20)
CO2: 18 mmol/L — ABNORMAL LOW (ref 22–32)
CO2: 20 mmol/L — ABNORMAL LOW (ref 22–32)
CO2: 21 mmol/L — ABNORMAL LOW (ref 22–32)
Calcium: 7.2 mg/dL — ABNORMAL LOW (ref 8.9–10.3)
Calcium: 7.5 mg/dL — ABNORMAL LOW (ref 8.9–10.3)
Calcium: 8 mg/dL — ABNORMAL LOW (ref 8.9–10.3)
Chloride: 107 mmol/L (ref 98–111)
Chloride: 105 mmol/L (ref 98–111)
Chloride: 105 mmol/L (ref 98–111)
Creatinine, Ser: 0.72 mg/dL (ref 0.61–1.24)
Creatinine, Ser: 0.65 mg/dL (ref 0.61–1.24)
Creatinine, Ser: 0.54 mg/dL — ABNORMAL LOW (ref 0.61–1.24)
GFR calc Af Amer: >60 mL/min (ref >60)
GFR calc Af Amer: >60 mL/min (ref >60)
GFR calc Af Amer: >60 mL/min (ref >60)
GFR calc non Af Amer: >60 mL/min (ref >60)
GFR calc non Af Amer: >60 mL/min (ref >60)
GFR calc non Af Amer: >60 mL/min (ref >60)
Glucose, Bld: 69 mg/dL — ABNORMAL LOW (ref 70–99)
Glucose, Bld: 91 mg/dL (ref 70–99)
Glucose, Bld: 103 mg/dL — ABNORMAL HIGH (ref 70–99)
Potassium: 4.4 mmol/L (ref 3.5–5.1)
Potassium: 3.5 mmol/L (ref 3.5–5.1)
Potassium: 3.5 mmol/L (ref 3.5–5.1)
Sodium: 138 mmol/L (ref 135–145)
Sodium: 137 mmol/L (ref 135–145)
Sodium: 136 mmol/L (ref 135–145)

## 2020-05-05 LAB — HEPATIC FUNCTION PANEL
ALT: 110 U/L — ABNORMAL HIGH (ref 0–44)
AST: 122 U/L — ABNORMAL HIGH (ref 15–41)
Albumin: 3.4 g/dL — ABNORMAL LOW (ref 3.5–5.0)
Alkaline Phosphatase: 44 U/L (ref 38–126)
Bilirubin, Direct: 0.1 mg/dL (ref 0.0–0.2)
Indirect Bilirubin: 0.9 mg/dL (ref 0.3–0.9)
Total Bilirubin: 1 mg/dL (ref 0.3–1.2)
Total Protein: 5.6 g/dL — ABNORMAL LOW (ref 6.5–8.1)

## 2020-05-05 LAB — ABO/RH: ABO/RH(D): A POS

## 2020-05-05 LAB — TYPE AND SCREEN
ABO/RH(D): A POS
Antibody Screen: NEGATIVE

## 2020-05-05 LAB — CBC
HCT: 39.6 % (ref 39.0–52.0)
Hemoglobin: 13.1 g/dL (ref 13.0–17.0)
MCH: 33.6 pg (ref 26.0–34.0)
MCHC: 33.1 g/dL (ref 30.0–36.0)
MCV: 101.5 fL — ABNORMAL HIGH (ref 80.0–100.0)
Platelets: 171 10*3/uL (ref 150–400)
RBC: 3.9 MIL/uL — ABNORMAL LOW (ref 4.22–5.81)
RDW: 15 % (ref 11.5–15.5)
WBC: 4.6 10*3/uL (ref 4.0–10.5)
nRBC: 0 % (ref 0.0–0.2)

## 2020-05-05 LAB — APTT: aPTT: 29 seconds (ref 24–36)

## 2020-05-05 LAB — RAPID URINE DRUG SCREEN, HOSP PERFORMED
Amphetamines: NOT DETECTED
Barbiturates: NOT DETECTED
Benzodiazepines: POSITIVE — AB
Cocaine: NOT DETECTED
Opiates: NOT DETECTED
Tetrahydrocannabinol: NOT DETECTED

## 2020-05-05 LAB — COMPREHENSIVE METABOLIC PANEL
ALT: 156 U/L — ABNORMAL HIGH (ref 0–44)
AST: 197 U/L — ABNORMAL HIGH (ref 15–41)
Albumin: 4.6 g/dL (ref 3.5–5.0)
Alkaline Phosphatase: 60 U/L (ref 38–126)
Anion gap: 26 — ABNORMAL HIGH (ref 5–15)
BUN: 16 mg/dL (ref 6–20)
CO2: 14 mmol/L — ABNORMAL LOW (ref 22–32)
Calcium: 8.7 mg/dL — ABNORMAL LOW (ref 8.9–10.3)
Chloride: 102 mmol/L (ref 98–111)
Creatinine, Ser: 0.93 mg/dL (ref 0.61–1.24)
GFR calc Af Amer: >60 mL/min (ref >60)
GFR calc non Af Amer: >60 mL/min (ref >60)
Glucose, Bld: 73 mg/dL (ref 70–99)
Potassium: 4.4 mmol/L (ref 3.5–5.1)
Sodium: 142 mmol/L (ref 135–145)
Total Bilirubin: 1.1 mg/dL (ref 0.3–1.2)
Total Protein: 7.8 g/dL (ref 6.5–8.1)

## 2020-05-05 LAB — LACTIC ACID, PLASMA
Lactic Acid, Venous: 6.5 mmol/L (ref 0.5–1.9)
Lactic Acid, Venous: 3.6 mmol/L (ref 0.5–1.9)
Lactic Acid, Venous: 2 mmol/L (ref 0.5–1.9)
Lactic Acid, Venous: 1.7 mmol/L (ref 0.5–1.9)

## 2020-05-05 LAB — SARS CORONAVIRUS 2 BY RT PCR (HOSPITAL ORDER, PERFORMED IN ~~LOC~~ HOSPITAL LAB): SARS Coronavirus 2: NEGATIVE

## 2020-05-05 LAB — LIPASE, BLOOD: Lipase: 77 U/L — ABNORMAL HIGH (ref 11–51)

## 2020-05-05 MED ORDER — CHLORDIAZEPOXIDE HCL 25 MG PO CAPS: 25.0000 mg | ORAL_CAPSULE | Freq: Once | ORAL | Status: DC

## 2020-05-05 MED ORDER — MUPIROCIN 2 % EX OINT
1.0000 "application " | TOPICAL_OINTMENT | Freq: Two times a day (BID) | CUTANEOUS | Status: DC
  Administered 2020-05-05 – 2020-05-08 (×7): 1 via NASAL
  Filled 2020-05-05 (×2): qty 22

## 2020-05-05 MED ORDER — SODIUM CHLORIDE 0.9 % IV BOLUS
1000.0000 mL | Freq: Once | INTRAVENOUS | Status: AC
  Administered 2020-05-05: 1000 mL via INTRAVENOUS

## 2020-05-05 MED ORDER — PANTOPRAZOLE SODIUM 40 MG IV SOLR
40.0000 mg | Freq: Two times a day (BID) | INTRAVENOUS | Status: DC
  Administered 2020-05-05 – 2020-05-06 (×4): 40 mg via INTRAVENOUS
  Filled 2020-05-05 (×4): qty 40

## 2020-05-05 MED ORDER — DEXTROSE-NACL 5-0.45 % IV SOLN: INTRAVENOUS | Status: DC

## 2020-05-05 MED ORDER — SENNOSIDES-DOCUSATE SODIUM 8.6-50 MG PO TABS: 2.0000 | ORAL_TABLET | Freq: Every evening | ORAL | Status: DC | PRN

## 2020-05-05 MED ORDER — CHLORDIAZEPOXIDE HCL 25 MG PO CAPS
25.0000 mg | ORAL_CAPSULE | Freq: Three times a day (TID) | ORAL | Status: AC
  Administered 2020-05-05 (×3): 25 mg via ORAL
  Filled 2020-05-05 (×3): qty 1

## 2020-05-05 MED ORDER — ONDANSETRON HCL 4 MG PO TABS
4.0000 mg | ORAL_TABLET | Freq: Four times a day (QID) | ORAL | Status: DC | PRN
  Administered 2020-05-07: 4 mg via ORAL
  Filled 2020-05-05: qty 1

## 2020-05-05 MED ORDER — CHLORDIAZEPOXIDE HCL 25 MG PO CAPS
25.0000 mg | ORAL_CAPSULE | Freq: Two times a day (BID) | ORAL | Status: AC
  Administered 2020-05-06 (×2): 25 mg via ORAL
  Filled 2020-05-05 (×2): qty 1

## 2020-05-05 MED ORDER — POLYETHYLENE GLYCOL 3350 17 G PO PACK: 17.0000 g | PACK | Freq: Every day | ORAL | Status: DC | PRN

## 2020-05-05 MED ORDER — BOOST / RESOURCE BREEZE PO LIQD CUSTOM
1.0000 | Freq: Three times a day (TID) | ORAL | Status: DC
  Administered 2020-05-05 – 2020-05-08 (×7): 1 via ORAL

## 2020-05-05 MED ORDER — ORAL CARE MOUTH RINSE
15.0000 mL | Freq: Two times a day (BID) | OROMUCOSAL | Status: DC
  Administered 2020-05-05 – 2020-05-08 (×6): 15 mL via OROMUCOSAL

## 2020-05-05 MED ORDER — ONDANSETRON HCL 4 MG/2ML IJ SOLN
4.0000 mg | Freq: Four times a day (QID) | INTRAMUSCULAR | Status: DC | PRN
  Administered 2020-05-05 – 2020-05-08 (×4): 4 mg via INTRAVENOUS
  Filled 2020-05-05 (×4): qty 2

## 2020-05-05 MED ORDER — CHLORHEXIDINE GLUCONATE CLOTH 2 % EX PADS
6.0000 | MEDICATED_PAD | Freq: Every day | CUTANEOUS | Status: DC
  Administered 2020-05-05 – 2020-05-08 (×3): 6 via TOPICAL

## 2020-05-05 MED ORDER — ELVITEG-COBIC-EMTRICIT-TENOFAF 150-150-200-10 MG PO TABS
1.0000 | ORAL_TABLET | Freq: Every day | ORAL | Status: DC
  Administered 2020-05-05 – 2020-05-09 (×5): 1 via ORAL
  Filled 2020-05-05 (×5): qty 1

## 2020-05-05 NOTE — Progress Notes (Signed)
Pt admitted to icu 1233. Pt had visible tremors upon admission. ciwa 23, 4mg  ativan administered. Pt has no phone number of his mother Trevyn Lumpkin, in [place], [place]). I called operator and no phone number listed in general directory. [place]

## 2020-05-05 NOTE — ED Notes (Signed)
Pt informed that his next Ativan is at about 6am. Pt verbalized acknowledgement.

## 2020-05-05 NOTE — ED Notes (Signed)
Date and time results received: 05/05/20 3:19 AM  (use smartphrase ".now" to insert current time)  Test: Lactic Critical Value: 3.6  Name of Provider Notified: Julian Reil  Orders Received? Or Actions Taken?: Orders Received - See Orders for details

## 2020-05-05 NOTE — ED Notes (Signed)
Writer called to room by pharmacy technician at 1136. Patient in visible distress, severe tremor in arms and extremities, visible sweat on forehead. Patient reported nausea and severe headache. Denied visual/audible disturbances or hallucinations. Patient voiced he needed medication to help and then could not or would not answer questions. CIWA performed. 4mg  ativan given. At this time (1144) tremor barely visible. RR have slowed to 18. BP 131/83, o2 sat 97 RA, hr is 88. Patient states "I feel so much better" patient says nausea has gone away. Headache decreased. No visible sweat on palms or other areas of body.  Patient able to engage in conversation. Patient's face/chest area noted to be reddened. Patient states it is his baseline due to ETOH, also added with sunburn.

## 2020-05-05 NOTE — Consult Note (Signed)
Consult Note for Gary [name]  Reason for Consult: Melena and heme positive stool Referring Physician: [facility]  [name] HPI: This is a 34 year old male with a PMH of ETOH abuse and HIV admitted for ETOH withdrawal, nausea, and vomiting.  On a routine basis he drinks two fifths of vodka per day.  Over the past week he reports problems with nausea and vomiting.  He suffered with this problem 8 months ago.  No treatment was rendered as he was incarcerated shortly afterwards for 7 months.  He was released one month ago and he promptly resumed drinking ETOH.  Upon admission he was noted to have an ETOH level of 280 and he was in ketoacidosis.  The patient also exhibits an elevation in his liver enzymes consistent with ETOH use.  The patient also reports having melena and it was confirmed to be heme positive for several days before his admission.  He states that his stools vary between specks and "mud".  His INR is normal and his HGB is at 13 g/dL, which is in the range of his prior HGB values.  Past Medical History:  Diagnosis Date  . Alcohol abuse   . HIV (human immunodeficiency virus infection) (HCC)   . Subdural hematoma [facility])     Past Surgical History:  Procedure Laterality Date  . INCISION AND DRAINAGE PERIRECTAL ABSCESS N/A 09/24/2016   Procedure: IRRIGATION AND DEBRIDEMENT PERIRECTAL ABSCESS;  Surgeon: Ricarda Frame, MD;  Location: [facility] ORS;  Service: General;  Laterality: N/A;  . none      Family History  Problem Relation Age of Onset  . Alcohol abuse Mother   . Alcohol abuse Father     Social History:  reports that he has quit smoking. His smoking use included cigarettes. He smoked 0.50 packs per day. He has never used smokeless tobacco. He reports previous alcohol use of about 3.0 standard drinks of alcohol per week. He reports previous drug use. Drugs: Marijuana and Methamphetamines.  Allergies:  Allergies  Allergen Reactions  . Caffeine Palpitations     Medications:  Scheduled: . chlordiazePOXIDE  25 mg Oral TID   Followed by  . [START ON 05/06/2020] chlordiazePOXIDE  25 mg Oral BID   Followed by  . [START ON 05/07/2020] chlordiazePOXIDE  25 mg Oral Once  . elvitegravir-cobicistat-emtricitabine-tenofovir  1 tablet Oral Q breakfast  . LORazepam  0-4 mg Intravenous Q6H   Or  . LORazepam  0-4 mg Oral Q6H  . [START ON 05/07/2020] LORazepam  0-4 mg Intravenous Q12H   Or  . [START ON 05/07/2020] LORazepam  0-4 mg Oral Q12H  . pantoprazole (PROTONIX) IV  40 mg Intravenous Q12H  . thiamine  100 mg Oral Daily   Or  . thiamine  100 mg Intravenous Daily   Continuous: . acetaminophen 1,000 mg (05/05/20 0658)  . dextrose 5 % and 0.45% NaCl 100 mL/hr at 05/05/20 0308    Results for orders placed or performed during the hospital encounter of 05/04/20 (from the past 24 hour(s))  Lipase, blood     Status: Abnormal   Collection Time: 05/04/20 10:41 PM  Result Value Ref Range   Lipase 77 (H) 11 - 51 U/L  Comprehensive metabolic panel     Status: Abnormal   Collection Time: 05/04/20 10:41 PM  Result Value Ref Range   Sodium 142 135 - 145 mmol/L   Potassium 4.4 3.5 - 5.1 mmol/L   Chloride 102 98 - 111 mmol/L   CO2  14 (L) 22 - 32 mmol/L   Glucose, Bld 73 70 - 99 mg/dL   BUN 16 6 - 20 mg/dL   Creatinine, Ser 0.93 0.61 - 1.24 mg/dL   Calcium 8.7 (L) 8.9 - 10.3 mg/dL   Total Protein 7.8 6.5 - 8.1 g/dL   Albumin 4.6 3.5 - 5.0 g/dL   AST 197 (H) 15 - 41 U/L   ALT 156 (H) 0 - 44 U/L   Alkaline Phosphatase 60 38 - 126 U/L   Total Bilirubin 1.1 0.3 - 1.2 mg/dL   GFR calc non Af Amer >60 >60 mL/min   GFR calc Af Amer >60 >60 mL/min   Anion gap 26 (H) 5 - 15  CBC     Status: Abnormal   Collection Time: 05/04/20 10:41 PM  Result Value Ref Range   WBC 6.6 4.0 - 10.5 K/uL   RBC 4.75 4.22 - 5.81 MIL/uL   Hemoglobin 16.1 13.0 - 17.0 g/dL   HCT 48.0 39 - 52 %   MCV 101.1 (H) 80.0 - 100.0 fL   MCH 33.9 26.0 - 34.0 pg   MCHC 33.5 30.0 - 36.0  g/dL   RDW 14.9 11.5 - 15.5 %   Platelets 278 150 - 400 K/uL   nRBC 0.0 0.0 - 0.2 %  POC occult blood, ED     Status: Abnormal   Collection Time: 05/04/20 10:51 PM  Result Value Ref Range   Fecal Occult Bld POSITIVE (A) NEGATIVE  Lactic acid, plasma     Status: Abnormal   Collection Time: 05/04/20 10:53 PM  Result Value Ref Range   Lactic Acid, Venous 6.5 (HH) 0.5 - 1.9 mmol/L  Blood culture (routine x 2)     Status: None (Preliminary result)   Collection Time: 05/04/20 10:53 PM   Specimen: BLOOD  Result Value Ref Range   Specimen Description      BLOOD RIGHT ANTECUBITAL Performed at [facility], [place] [address], [place], [place] [address]    Special Requests      BOTTLES DRAWN AEROBIC AND ANAEROBIC Blood Culture results may not be optimal due to an excessive volume of blood received in culture bottles Performed at [facility] [address], [place], [place] [address]    Culture      NO GROWTH < 12 HOURS Performed at [facility] [address] [place], [place] [address]    Report Status PENDING   Protime-INR     Status: None   Collection Time: 05/04/20 10:53 PM  Result Value Ref Range   Prothrombin Time 12.9 11.4 - 15.2 seconds   INR 1.0 0.8 - 1.2  Ethanol     Status: Abnormal   Collection Time: 05/04/20 10:53 PM  Result Value Ref Range   Alcohol, Ethyl (B) 280 (H) <10 mg/dL  Rapid urine drug screen (hospital performed)     Status: Abnormal   Collection Time: 05/04/20 10:53 PM  Result Value Ref Range   Opiates NONE DETECTED NONE DETECTED   Cocaine NONE DETECTED NONE DETECTED   Benzodiazepines POSITIVE (A) NONE DETECTED   Amphetamines NONE DETECTED NONE DETECTED   Tetrahydrocannabinol NONE DETECTED NONE DETECTED   Barbiturates NONE DETECTED NONE DETECTED  Type and screen [place]     Status: None   Collection Time: 05/04/20 10:55 PM  Result Value Ref Range   ABO/RH(D) A POS    Antibody Screen NEG     Sample Expiration  05/07/2020,2359 Performed at [facility], [address], [place], [place] [address]   ABO/Rh     Status: None (Preliminary result)   Collection Time: 05/04/20 10:55 PM  Result Value Ref Range   ABO/RH(D)      A POS Performed at [facility], [address], [place], [place] [address]   Blood culture (routine x 2)     Status: None (Preliminary result)   Collection Time: 05/04/20 10:58 PM   Specimen: BLOOD RIGHT HAND  Result Value Ref Range   Specimen Description      BLOOD RIGHT HAND Performed at [facility], [address], [place], [place] [address]    Special Requests      BOTTLES DRAWN AEROBIC AND ANAEROBIC Blood Culture adequate volume Performed at [facility], [address] [place], [place] [address]    Culture      NO GROWTH < 12 HOURS Performed at [facility], [address] [place], [place] [address]    Report Status PENDING   APTT     Status: None   Collection Time: 05/04/20 11:00 PM  Result Value Ref Range   aPTT 29 24 - 36 seconds  SARS Coronavirus 2 by RT PCR (hospital order, performed in [facility] hospital lab) Nasopharyngeal Nasopharyngeal Swab     Status: None   Collection Time: 05/04/20 11:00 PM   Specimen: Nasopharyngeal Swab  Result Value Ref Range   SARS Coronavirus 2 NEGATIVE NEGATIVE  Urinalysis, Routine w reflex microscopic     Status: Abnormal   Collection Time: 05/05/20  1:23 AM  Result Value Ref Range   Color, Urine YELLOW YELLOW   APPearance CLEAR CLEAR   Specific Gravity, Urine 1.021 1.005 - 1.030   pH 5.0 5.0 - 8.0   Glucose, UA NEGATIVE NEGATIVE mg/dL   Hgb urine dipstick SMALL (A) NEGATIVE   Bilirubin Urine NEGATIVE NEGATIVE   Ketones, ur 80 (A) NEGATIVE mg/dL   Protein, ur 474 (A) NEGATIVE mg/dL   Nitrite NEGATIVE NEGATIVE   Leukocytes,Ua NEGATIVE NEGATIVE   RBC / HPF 0-5 0 - 5 RBC/hpf   WBC, UA 0-5 0 - 5 WBC/hpf    Bacteria, UA NONE SEEN NONE SEEN   Squamous Epithelial / LPF 0-5 0 - 5   Mucus PRESENT    Hyaline Casts, UA PRESENT   Lactic acid, plasma     Status: Abnormal   Collection Time: 05/05/20  2:23 AM  Result Value Ref Range   Lactic Acid, Venous 3.6 (HH) 0.5 - 1.9 mmol/L  Basic metabolic panel     Status: Abnormal   Collection Time: 05/05/20  3:06 AM  Result Value Ref Range   Sodium 138 135 - 145 mmol/L   Potassium 4.4 3.5 - 5.1 mmol/L   Chloride 107 98 - 111 mmol/L   CO2 18 (L) 22 - 32 mmol/L   Glucose, Bld 69 (L) 70 - 99 mg/dL   BUN 12 6 - 20 mg/dL   Creatinine, Ser 2.59 0.61 - 1.24 mg/dL   Calcium 7.2 (L) 8.9 - 10.3 mg/dL   GFR calc non Af Amer >60 >60 mL/min   GFR calc Af Amer >60 >60 mL/min   Anion gap 13 5 - 15  CBC     Status: Abnormal   Collection Time: 05/05/20  5:00 AM  Result Value Ref Range   WBC 4.6 4.0 - 10.5 K/uL   RBC 3.90 (L) 4.22 - 5.81 MIL/uL  Hemoglobin 13.1 13.0 - 17.0 g/dL   HCT 41.7 39 - 52 %   MCV 101.5 (H) 80.0 - 100.0 fL   MCH 33.6 26.0 - 34.0 pg   MCHC 33.1 30.0 - 36.0 g/dL   RDW 40.8 14.4 - 81.8 %   Platelets 171 150 - 400 K/uL   nRBC 0.0 0.0 - 0.2 %  Hepatic function panel     Status: Abnormal   Collection Time: 05/05/20  5:00 AM  Result Value Ref Range   Total Protein 5.6 (L) 6.5 - 8.1 g/dL   Albumin 3.4 (L) 3.5 - 5.0 g/dL   AST 563 (H) 15 - 41 U/L   ALT 110 (H) 0 - 44 U/L   Alkaline Phosphatase 44 38 - 126 U/L   Total Bilirubin 1.0 0.3 - 1.2 mg/dL   Bilirubin, Direct 0.1 0.0 - 0.2 mg/dL   Indirect Bilirubin 0.9 0.3 - 0.9 mg/dL  Basic metabolic panel     Status: Abnormal   Collection Time: 05/05/20  5:50 AM  Result Value Ref Range   Sodium 137 135 - 145 mmol/L   Potassium 3.5 3.5 - 5.1 mmol/L   Chloride 105 98 - 111 mmol/L   CO2 20 (L) 22 - 32 mmol/L   Glucose, Bld 91 70 - 99 mg/dL   BUN 11 6 - 20 mg/dL   Creatinine, Ser 1.49 0.61 - 1.24 mg/dL   Calcium 7.5 (L) 8.9 - 10.3 mg/dL   GFR calc non Af Amer >60 >60 mL/min   GFR calc Af  Amer >60 >60 mL/min   Anion gap 12 5 - 15     DG Chest Portable 1 View  Result Date: 05/04/2020 CLINICAL DATA:  Cough EXAM: PORTABLE CHEST 1 VIEW COMPARISON:  None. FINDINGS: The heart size and mediastinal contours are within normal limits. Both lungs are clear. The visualized skeletal structures are unremarkable. IMPRESSION: No active disease. Electronically Signed   By: Charlett Nose M.D.   On: 05/04/2020 23:15    ROS:  As stated above in the HPI otherwise negative.  Blood pressure 122/82, pulse 91, temperature 100.1 F (37.8 C), temperature source Rectal, resp. rate 17, height 6\' 1"  (1.854 m), weight 73 kg, SpO2 99 %.    PE: Gen: NAD, but very tremulous, Alert and Oriented HEENT:  [place]/AT, EOMI Neck: Supple, no LAD Lungs: CTA Bilaterally CV: RRR without M/G/R ABD: Soft, diffuse pain with palpation, +BS Ext: No C/C/E, significant shaking of his hands  Assessment/Plan: 1) Melena. 2) Nausea/vomiting and abdominal pain. 3) ETOH abuse. 4) Elevated liver enzymes, possible mild ETOH hepatitis.   Further evaluation with an EGD is reasonable with his recent history of vomiting and ETOH abuse.  He is not jaundiced, but his liver enzymes are consistent with ETOH abuse.  It may be that he has a very mild ETOH hepatitis.  He appears to be withdrawing from ETOH at this time and he does not appear to be medically stable for an EGD.  He is hemodynamically stable.  Plan: 1) EGD when he is medically stable. 2) Treat for withdraw symptoms. 3) Monitor HGB. 4) Forgan [name] to assume care in the AM.  Shimon Trowbridge D 05/05/2020, 10:10 AM

## 2020-05-05 NOTE — ED Notes (Signed)
Requested pharmacy to adjust times for acetaminophen IV to 7am, so doses are 6 hours apart.

## 2020-05-05 NOTE — Progress Notes (Addendum)
Patient admitted overnight for concerns of alcohol intoxication, acidosis and melanotic stool.  Started on PPI drip.  Hemodynamically stable.  Vital signs stable this morning.  Closely monitor him.  Spoke with gastroenterology, Dr. Elnoria Howard.  Likely needs endoscopy tomorrow.  Clear liquid diet.  N.p.o. past midnight  Transition from IV Protonix drip to PPI IV twice daily. Continue alcohol withdrawal protocol.  Librium taper added Continue HAART therapy.  Call with further questions as needed. Time spent: 10 mins  Stephania Fragmin MD

## 2020-05-05 NOTE — Progress Notes (Signed)
Code Sepsis completion note:  LA was 6.5, ED RN contacted MD and an additional IVF bolus was administered. Pt was started on a IVF gtt and being admitted to a Stepdown unit for further monitoring. Second LA down to 3.6.   Danira Nylander DNP eLink RN 05:12 AM

## 2020-05-05 NOTE — ED Notes (Signed)
Date and time results received: 05/05/20 0154 (use smartphrase ".now" to insert current time)  Test: Lctic Acid Critical Value: 6.5  Name of Provider Notified: Ward DO  Orders Received? Or Actions Taken?: Waiting on orders

## 2020-05-05 NOTE — ED Notes (Signed)
ED TO INPATIENT HANDOFF REPORT  ED Nurse Name and Phone #: [phone]  S Name/Age/Gender Gary [name] 34 y.o. male Room/Bed: WA20/WA20  Code Status   Code Status: Full Code  Home/SNF/Other Home Patient oriented to: self, place, time and situation Is this baseline? Yes   Triage Complete: Triage complete  Chief Complaint Alcoholic ketoacidosis [E87.2]  Triage Note [place], reports alcohol withdrawal, unable to tolerate PO x2 days. C/o weakness, N/V. +orthostatics  HR 110 1000NS with EMS 4mg  Zofran 18g L FA    Allergies Allergies  Allergen Reactions  . Caffeine Palpitations    Level of Care/Admitting Diagnosis ED Disposition    ED Disposition Condition Comment   Admit  Hospital Area: [facility] [[id number]]  Level of Care: Stepdown [14]  Admit to SDU based on following criteria: Severe physiological/psychological symptoms:  Any diagnosis requiring assessment & intervention at least every 4 hours on an ongoing basis to obtain desired patient outcomes including stability and rehabilitation  May [facility] patient to [name] if equivalent level of care is available:: Yes  Covid Evaluation: Asymptomatic Screening Protocol (No Symptoms)  Admission Type: Emergency [1]  Diagnosis: Alcoholic ketoacidosis [172225]  Admitting Physician: 07-09-1969 Hillary Bow  Attending Physician: [[id number]] [phone]  Estimated length of stay: past midnight tomorrow  Certification:: I certify this patient will need inpatient services for at least 2 midnights       B Medical/Surgery History Past Medical History:  Diagnosis Date  . Alcohol abuse   . HIV (human immunodeficiency virus infection) (HCC)   . Subdural hematoma [facility])    Past Surgical History:  Procedure Laterality Date  . INCISION AND DRAINAGE PERIRECTAL ABSCESS N/A 09/24/2016   Procedure: IRRIGATION AND DEBRIDEMENT PERIRECTAL ABSCESS;  Surgeon: 09/26/2016,  MD;  Location: [facility] ORS;  Service: General;  Laterality: N/A;  . none       A IV Location/Drains/Wounds Patient Lines/Drains/Airways Status    Active Line/Drains/Airways    Name Placement date Placement time Site Days   Peripheral IV 05/04/20 Left;Posterior Forearm 05/04/20  2326  Forearm  1   Peripheral IV 05/04/20 Right Antecubital 05/04/20  2329  Antecubital  1   Incision (Closed) 09/24/16 Rectum 09/24/16  0842   1319   Wound / Incision (Open or Dehisced) 07/02/19 Laceration Head Right;Posterior;Lateral 07/02/19  1045  Head  308          Intake/Output Last 24 hours  Intake/Output Summary (Last 24 hours) at 05/05/2020 1442 Last data filed at 05/05/2020 05/07/2020 Gross per 24 hour  Intake 461.31 ml  Output --  Net 461.31 ml    Labs/Imaging Results for orders placed or performed during the hospital encounter of 05/04/20 (from the past 48 hour(s))  Lipase, blood     Status: Abnormal   Collection Time: 05/04/20 10:41 PM  Result Value Ref Range   Lipase 77 (H) 11 - 51 U/L    Comment: Performed at [facility], [address], [place], [place]  Comprehensive metabolic panel     Status: Abnormal   Collection Time: 05/04/20 10:41 PM  Result Value Ref Range   Sodium 142 135 - 145 mmol/L   Potassium 4.4 3.5 - 5.1 mmol/L   Chloride 102 98 - 111 mmol/L   CO2 14 (L) 22 - 32 mmol/L   Glucose, Bld 73 70 - 99 mg/dL    Comment: Glucose reference range applies only to samples taken  after fasting for at least 8 hours.   BUN 16 6 - 20 mg/dL   Creatinine, Ser 0.93 0.61 - 1.24 mg/dL   Calcium 8.7 (L) 8.9 - 10.3 mg/dL   Total Protein 7.8 6.5 - 8.1 g/dL   Albumin 4.6 3.5 - 5.0 g/dL   AST 197 (H) 15 - 41 U/L   ALT 156 (H) 0 - 44 U/L   Alkaline Phosphatase 60 38 - 126 U/L   Total Bilirubin 1.1 0.3 - 1.2 mg/dL   GFR calc non Af Amer >60 >60 mL/min   GFR calc Af Amer >60 >60 mL/min   Anion gap 26 (H) 5 - 15    Comment: Performed at [facility],  [place] [address] [place], [place] [address]  CBC     Status: Abnormal   Collection Time: 05/04/20 10:41 PM  Result Value Ref Range   WBC 6.6 4.0 - 10.5 K/uL   RBC 4.75 4.22 - 5.81 MIL/uL   Hemoglobin 16.1 13.0 - 17.0 g/dL   HCT 48.0 39 - 52 %   MCV 101.1 (H) 80.0 - 100.0 fL   MCH 33.9 26.0 - 34.0 pg   MCHC 33.5 30.0 - 36.0 g/dL   RDW 14.9 11.5 - 15.5 %   Platelets 278 150 - 400 K/uL   nRBC 0.0 0.0 - 0.2 %    Comment: Performed at [facility], [place] [address] [place], [place] [address]  POC occult blood, ED     Status: Abnormal   Collection Time: 05/04/20 10:51 PM  Result Value Ref Range   Fecal Occult Bld POSITIVE (A) NEGATIVE  Lactic acid, plasma     Status: Abnormal   Collection Time: 05/04/20 10:53 PM  Result Value Ref Range   Lactic Acid, Venous 6.5 (HH) 0.5 - 1.9 mmol/L    Comment: CRITICAL RESULT CALLED TO, READ BACK BY AND VERIFIED WITH: NASH, G. @ 0154 05/05/2020 PERRY, J.  Performed at [facility], [place] [address] [place], [place] [address]   Blood culture (routine x 2)     Status: None (Preliminary result)   Collection Time: 05/04/20 10:53 PM   Specimen: BLOOD  Result Value Ref Range   Specimen Description      BLOOD RIGHT ANTECUBITAL Performed at [place] [address] [place], [place] [address]    Special Requests      BOTTLES DRAWN AEROBIC AND ANAEROBIC Blood Culture results may not be optimal due to an excessive volume of blood received in culture bottles Performed at [place] [address] [place], [place] [address]    Culture      NO GROWTH < 12 HOURS Performed at [place] [address], [place], [place] [address]    Report Status PENDING   Protime-INR     Status: None   Collection Time: 05/04/20 10:53 PM  Result Value Ref Range   Prothrombin Time 12.9 11.4 - 15.2 seconds   INR 1.0 0.8 - 1.2    Comment: (NOTE) INR goal varies based on device and  disease states. Performed at [facility], [place] [address], [place], [place] [address]   Ethanol     Status: Abnormal   Collection Time: 05/04/20 10:53 PM  Result Value Ref Range   Alcohol, Ethyl (B) 280 (H) <10 mg/dL    Comment: (NOTE) Lowest detectable limit for serum alcohol is 10 mg/dL.  For medical purposes only. Performed at  [facility], [address], [place], [place] [address]   Rapid urine drug screen (hospital performed)     Status: Abnormal   Collection Time: 05/04/20 10:53 PM  Result Value Ref Range   Opiates NONE DETECTED NONE DETECTED   Cocaine NONE DETECTED NONE DETECTED   Benzodiazepines POSITIVE (A) NONE DETECTED   Amphetamines NONE DETECTED NONE DETECTED   Tetrahydrocannabinol NONE DETECTED NONE DETECTED   Barbiturates NONE DETECTED NONE DETECTED    Comment: (NOTE) DRUG SCREEN FOR MEDICAL PURPOSES ONLY.  IF CONFIRMATION IS NEEDED FOR ANY PURPOSE, NOTIFY LAB WITHIN 5 DAYS.  LOWEST DETECTABLE LIMITS FOR URINE DRUG SCREEN Drug Class                     Cutoff (ng/mL) Amphetamine and metabolites    1000 Barbiturate and metabolites    200 Benzodiazepine                 200 Tricyclics and metabolites     300 Opiates and metabolites        300 Cocaine and metabolites        300 THC                            50 Performed at [facility], [address], [place], [place] [address]   Type and screen [facility]     Status: None   Collection Time: 05/04/20 10:55 PM  Result Value Ref Range   ABO/RH(D) A POS    Antibody Screen NEG    Sample Expiration      05/07/2020,2359 Performed at [facility], [address] [place], [place] [address]   ABO/Rh     Status: None (Preliminary result)   Collection Time: 05/04/20 10:55 PM  Result Value Ref Range   ABO/RH(D)      A POS Performed at [facility], [address], [place], [place] [address]    Blood culture (routine x 2)     Status: None (Preliminary result)   Collection Time: 05/04/20 10:58 PM   Specimen: BLOOD RIGHT HAND  Result Value Ref Range   Specimen Description      BLOOD RIGHT HAND Performed at [facility], [address] [place], [place] [address]    Special Requests      BOTTLES DRAWN AEROBIC AND ANAEROBIC Blood Culture adequate volume Performed at [facility], [address] [place], [place] [address]    Culture      NO GROWTH < 12 HOURS Performed at [facility], [address] [place], [place] [address]    Report Status PENDING   APTT     Status: None   Collection Time: 05/04/20 11:00 PM  Result Value Ref Range   aPTT 29 24 - 36 seconds    Comment: Performed at [facility], [address], [place], [place] [address]  SARS Coronavirus 2 by RT PCR (hospital order, performed in [facility] hospital lab) Nasopharyngeal Nasopharyngeal Swab     Status: None   Collection Time: 05/04/20 11:00 PM   Specimen: Nasopharyngeal Swab  Result Value Ref Range   SARS Coronavirus 2 NEGATIVE NEGATIVE    Comment: (NOTE) SARS-CoV-2 target nucleic acids are NOT DETECTED.  The SARS-CoV-2 RNA is generally detectable in upper and lower respiratory specimens during the acute phase of infection. The lowest concentration of  SARS-CoV-2 viral copies this assay can detect is 250 copies / mL. A negative result does not preclude SARS-CoV-2 infection and should not be used as the sole basis for treatment or other patient management decisions.  A negative result may occur with improper specimen collection / handling, submission of specimen other than nasopharyngeal swab, presence of viral mutation(s) within the areas targeted by this assay, and inadequate number of viral copies (<250 copies / mL). A negative result must be combined with clinical observations, patient history, and epidemiological information.  Fact  Sheet for Patients:   BoilerBrush.com.cyhttps://www.fda.gov/media/136312/download  Fact Sheet for Healthcare Providers: https://pope.com/https://www.fda.gov/media/136313/download  This test is not yet approved or  cleared by the [place] FDA and has been authorized for detection and/or diagnosis of SARS-CoV-2 by FDA under an Emergency Use Authorization (EUA).  This EUA will remain in effect (meaning this test can be used) for the duration of the COVID-19 declaration under Section 564(b)(1) of the Act, 21 U.S.C. section 360bbb-3(b)(1), unless the authorization is terminated or revoked sooner.  Performed at [facility], [address] [place], [place] [address]   Urinalysis, Routine w reflex microscopic     Status: Abnormal   Collection Time: 05/05/20  1:23 AM  Result Value Ref Range   Color, Urine YELLOW YELLOW   APPearance CLEAR CLEAR   Specific Gravity, Urine 1.021 1.005 - 1.030   pH 5.0 5.0 - 8.0   Glucose, UA NEGATIVE NEGATIVE mg/dL   Hgb urine dipstick SMALL (A) NEGATIVE   Bilirubin Urine NEGATIVE NEGATIVE   Ketones, ur 80 (A) NEGATIVE mg/dL   Protein, ur 191100 (A) NEGATIVE mg/dL   Nitrite NEGATIVE NEGATIVE   Leukocytes,Ua NEGATIVE NEGATIVE   RBC / HPF 0-5 0 - 5 RBC/hpf   WBC, UA 0-5 0 - 5 WBC/hpf   Bacteria, UA NONE SEEN NONE SEEN   Squamous Epithelial / LPF 0-5 0 - 5   Mucus PRESENT    Hyaline Casts, UA PRESENT     Comment: Performed at [facility], [address] [place], [place] [address]  Lactic acid, plasma     Status: Abnormal   Collection Time: 05/05/20  2:23 AM  Result Value Ref Range   Lactic Acid, Venous 3.6 (HH) 0.5 - 1.9 mmol/L    Comment: CRITICAL RESULT CALLED TO, READ BACK BY AND VERIFIED WITH: MCKEIVER, M. @ 0320 05/05/2020 PERRY, J. Performed at [facility], [address] [place], [place] [address]   Basic metabolic panel     Status: Abnormal   Collection Time: 05/05/20  3:06 AM  Result Value Ref Range   Sodium 138  135 - 145 mmol/L   Potassium 4.4 3.5 - 5.1 mmol/L   Chloride 107 98 - 111 mmol/L   CO2 18 (L) 22 - 32 mmol/L   Glucose, Bld 69 (L) 70 - 99 mg/dL    Comment: Glucose reference range applies only to samples taken after fasting for at least 8 hours.   BUN 12 6 - 20 mg/dL   Creatinine, Ser 0.860.72 0.61 - 1.24 mg/dL   Calcium 7.2 (L) 8.9 - 10.3 mg/dL   GFR calc non Af Amer >60 >60 mL/min   GFR calc Af Amer >60 >60 mL/min   Anion gap 13 5 - 15    Comment: Performed at [facility], [address] [place], [place] [address]  CBC     Status: Abnormal   Collection Time: 05/05/20  5:00 AM  Result  Value Ref Range   WBC 4.6 4.0 - 10.5 K/uL   RBC 3.90 (L) 4.22 - 5.81 MIL/uL   Hemoglobin 13.1 13.0 - 17.0 g/dL   HCT 74.2 39 - 52 %   MCV 101.5 (H) 80.0 - 100.0 fL   MCH 33.6 26.0 - 34.0 pg   MCHC 33.1 30.0 - 36.0 g/dL   RDW 59.5 63.8 - 75.6 %   Platelets 171 150 - 400 K/uL   nRBC 0.0 0.0 - 0.2 %    Comment: Performed at [facility], [address] [place], [place] [address]  Hepatic function panel     Status: Abnormal   Collection Time: 05/05/20  5:00 AM  Result Value Ref Range   Total Protein 5.6 (L) 6.5 - 8.1 g/dL   Albumin 3.4 (L) 3.5 - 5.0 g/dL   AST 518 (H) 15 - 41 U/L   ALT 110 (H) 0 - 44 U/L   Alkaline Phosphatase 44 38 - 126 U/L   Total Bilirubin 1.0 0.3 - 1.2 mg/dL   Bilirubin, Direct 0.1 0.0 - 0.2 mg/dL   Indirect Bilirubin 0.9 0.3 - 0.9 mg/dL    Comment: Performed at [facility], [address], [place], [place] [address]  Basic metabolic panel     Status: Abnormal   Collection Time: 05/05/20  5:50 AM  Result Value Ref Range   Sodium 137 135 - 145 mmol/L   Potassium 3.5 3.5 - 5.1 mmol/L    Comment: DELTA CHECK NOTED   Chloride 105 98 - 111 mmol/L   CO2 20 (L) 22 - 32 mmol/L   Glucose, Bld 91 70 - 99 mg/dL    Comment: Glucose reference range applies only to samples taken after fasting for at least 8 hours.   BUN 11 6  - 20 mg/dL   Creatinine, Ser 0.63 0.61 - 1.24 mg/dL   Calcium 7.5 (L) 8.9 - 10.3 mg/dL   GFR calc non Af Amer >60 >60 mL/min   GFR calc Af Amer >60 >60 mL/min   Anion gap 12 5 - 15    Comment: Performed at [facility], [address], [place], [place] [address]  Basic metabolic panel     Status: Abnormal   Collection Time: 05/05/20 10:17 AM  Result Value Ref Range   Sodium 136 135 - 145 mmol/L   Potassium 3.5 3.5 - 5.1 mmol/L   Chloride 105 98 - 111 mmol/L   CO2 21 (L) 22 - 32 mmol/L   Glucose, Bld 103 (H) 70 - 99 mg/dL    Comment: Glucose reference range applies only to samples taken after fasting for at least 8 hours.   BUN 11 6 - 20 mg/dL   Creatinine, Ser 0.93 (L) 0.61 - 1.24 mg/dL   Calcium 8.0 (L) 8.9 - 10.3 mg/dL   GFR calc non Af Amer >60 >60 mL/min   GFR calc Af Amer >60 >60 mL/min   Anion gap 10 5 - 15    Comment: Performed at [facility], [address], [place], [place] [address]  Lactic acid, plasma     Status: Abnormal   Collection Time: 05/05/20 10:17 AM  Result Value Ref Range   Lactic Acid, Venous 2.0 (HH) 0.5 - 1.9 mmol/L    Comment: CRITICAL VALUE NOTED.  VALUE IS CONSISTENT WITH PREVIOUSLY REPORTED AND CALLED VALUE. Performed at [facility], [address], [place], [place] [address]   Lactic acid, plasma  Status: None   Collection Time: 05/05/20 11:02 AM  Result Value Ref Range   Lactic Acid, Venous 1.7 0.5 - 1.9 mmol/L    Comment: Performed at [facility], [address] [place], [place] [address]   DG Chest Portable 1 View  Result Date: 05/04/2020 CLINICAL DATA:  Cough EXAM: PORTABLE CHEST 1 VIEW COMPARISON:  None. FINDINGS: The heart size and mediastinal contours are within normal limits. Both lungs are clear. The visualized skeletal structures are unremarkable. IMPRESSION: No active disease. Electronically Signed   By: Charlett Nose M.D.   On: 05/04/2020 23:15     Pending Labs Unresulted Labs (From admission, onward) Comment          Start     Ordered   05/06/20 0500  Comprehensive metabolic panel  Daily,   R     Question:  Specimen collection method  Answer:  Lab=Lab collect   05/05/20 0802   05/06/20 0500  CBC  Daily,   R     Question:  Specimen collection method  Answer:  Lab=Lab collect   05/05/20 0802   05/06/20 0500  Magnesium  Daily,   R     Question:  Specimen collection method  Answer:  Lab=Lab collect   05/05/20 0802   05/04/20 2300  Urine culture  ONCE - STAT,   STAT        05/04/20 2300          Vitals/Pain Today's Vitals   05/05/20 1347 05/05/20 1402 05/05/20 1422 05/05/20 1437  BP: 125/68 114/76 114/76 124/77  Pulse: 95 92 87 77  Resp: (!) Temp:      TempSrc:      SpO2: 96% 95% 96% 96%  Weight:      Height:      PainSc:        Isolation Precautions No active isolations  Medications Medications  LORazepam (ATIVAN) injection 0-4 mg (4 mg Intravenous Given 05/05/20 1139)    Or  LORazepam (ATIVAN) tablet 0-4 mg ( Oral See Alternative 05/05/20 1139)  LORazepam (ATIVAN) injection 0-4 mg (has no administration in time range)    Or  LORazepam (ATIVAN) tablet 0-4 mg (has no administration in time range)  thiamine tablet 100 mg (100 mg Oral Given 05/05/20 1411)    Or  thiamine (B-1) injection 100 mg ( Intravenous See Alternative 05/05/20 1411)  acetaminophen (OFIRMEV) IV 1,000 mg (1,000 mg Intravenous New Bag/Given 05/05/20 1404)  dextrose 5 %-0.45 % sodium chloride infusion ( Intravenous New Bag/Given 05/05/20 1302)  ondansetron (ZOFRAN) tablet 4 mg (has no administration in time range)    Or  ondansetron (ZOFRAN) injection 4 mg (has no administration in time range)  elvitegravir-cobicistat-emtricitabine-tenofovir (GENVOYA) 150-150-200-10 MG tablet 1 tablet (1 tablet Oral Given 05/05/20 0927)  polyethylene glycol (MIRALAX / GLYCOLAX) packet 17 g (has no administration in time range)  senna-docusate  (Senokot-S) tablet 2 tablet (has no administration in time range)  pantoprazole (PROTONIX) injection 40 mg (40 mg Intravenous Given 05/05/20 0929)  chlordiazePOXIDE (LIBRIUM) capsule 25 mg (25 mg Oral Given 05/05/20 1236)    Followed by  chlordiazePOXIDE (LIBRIUM) capsule 25 mg (has no administration in time range)    Followed by  chlordiazePOXIDE (LIBRIUM) capsule 25 mg (has no administration in time range)  pantoprazole (PROTONIX) 80 mg in sodium chloride 0.9 % 100 mL IVPB (0 mg Intravenous Stopped 05/05/20 0121)  ceFEPIme (MAXIPIME) 2 g in sodium chloride 0.9 % 100 mL IVPB (  0 g Intravenous Stopped 05/05/20 0109)  metroNIDAZOLE (FLAGYL) IVPB 500 mg (0 mg Intravenous Stopped 05/05/20 0108)  vancomycin (VANCOREADY) IVPB 1500 mg/300 mL (0 mg Intravenous Stopped 05/05/20 0413)  sodium chloride 0.9 % bolus 2,190 mL (0 mL/kg  73 kg Intravenous Stopped 05/05/20 0108)  sodium chloride 0.9 % bolus 1,000 mL (0 mLs Intravenous Stopped 05/05/20 0308)    Mobility walks Low fall risk   Focused Assessments .   R Recommendations: See Admitting Provider Note  Report given to:   Additional Notes: n/a

## 2020-05-05 NOTE — H&P (Signed)
History and Physical    Gary [name] YQM:[id number] DOB: [birth year] DOA: 05/04/2020  PCP: Patient, No Pcp Per  Patient coming from: Home  I have personally briefly reviewed patient's old medical records in [facility]  Chief Complaint: EtOH abuse  HPI: Gary [name] is a 34 y.o. male with medical history significant of HIV, EtOH abuse.  Pt presents to the ED with c/o EtOH "withdrawal" over the past 3-4 days.  Drinks alcohol daily, 1-2 fifths of vodka a day.  Has daily vomiting which he attributes to "withdrawal", worse over past 3-4 days.  Unable to keep alcohol down today he says.   ED Course: EtOH level of 280.  80 keytones in urine, lactate 6.5, AG 26, AST 197 ALT 156, bicarb 14.  Pt also has hemoccult positive stool, melena.  HGB 16.1.  Pt also has fever 100.1.  WBC nl, CXR neg, UA neg.  EDP put pt on empiric cefepime, flagyl, vanc.  Pt got 3.1L IVF bolus and then put on D5 half.   Review of Systems: As per HPI, otherwise all review of systems negative.  Past Medical History:  Diagnosis Date  . Alcohol abuse   . HIV (human immunodeficiency virus infection) (HCC)   . Subdural hematoma [facility])     Past Surgical History:  Procedure Laterality Date  . INCISION AND DRAINAGE PERIRECTAL ABSCESS N/A 09/24/2016   Procedure: IRRIGATION AND DEBRIDEMENT PERIRECTAL ABSCESS;  Surgeon: Ricarda Frame, MD;  Location: [facility] ORS;  Service: General;  Laterality: N/A;  . none       reports that he has quit smoking. His smoking use included cigarettes. He smoked 0.50 packs per day. He has never used smokeless tobacco. He reports previous alcohol use of about 3.0 standard drinks of alcohol per week. He reports previous drug use. Drugs: Marijuana and Methamphetamines.  Allergies  Allergen Reactions  . Caffeine Palpitations    Family History  Problem Relation Age of Onset  . Alcohol abuse Mother   . Alcohol abuse Father      Prior to Admission medications    Medication Sig Start Date End Date Taking? Authorizing Provider  folic acid (FOLVITE) 1 MG tablet Take 1 tablet (1 mg total) by mouth daily. Patient not taking: Reported on 04/17/2020 07/31/19   Shon Hale, MD  GENVOYA 150-150-200-10 MG TABS tablet TAKE 1 TABLET BY MOUTH DAILY WITH BREAKFAST 04/05/20   Cliffton Asters, MD  Multiple Vitamin (MULTIVITAMIN WITH MINERALS) TABS tablet Take 1 tablet by mouth daily. Patient not taking: Reported on 04/17/2020 07/31/19   Shon Hale, MD  nicotine (NICODERM CQ - DOSED IN MG/24 HOURS) 21 mg/24hr patch Place 1 patch (21 mg total) onto the skin daily. Patient not taking: Reported on 04/17/2020 07/31/19   Shon Hale, MD  pantoprazole (PROTONIX) 40 MG tablet Take 1 tablet (40 mg total) by mouth daily. Patient not taking: Reported on 04/17/2020 07/31/19   Shon Hale, MD  thiamine 100 MG tablet Take 1 tablet (100 mg total) by mouth daily. Patient not taking: Reported on 04/17/2020 07/31/19   Shon Hale, MD  famotidine (PEPCID) 20 MG tablet Take 1 tablet (20 mg total) by mouth 2 (two) times daily. Patient not taking: Reported on 06/01/2019 05/14/19 06/02/19  Benjiman Core, MD    Physical Exam: Vitals:   05/05/20 0215 05/05/20 0230 05/05/20 0240 05/05/20 0300  BP:   110/72 117/78  Pulse: 92 99 96 92  Resp: 20 (!) 24 (!) 24 20  Temp:  TempSrc:      SpO2: 97% 99% 100% 98%  Weight:      Height:        Constitutional: NAD, calm, comfortable Eyes: PERRL, lids and conjunctivae normal ENMT: Mucous membranes are moist. Posterior pharynx clear of any exudate or lesions.Normal dentition.  Neck: normal, supple, no masses, no thyromegaly Respiratory: clear to auscultation bilaterally, no wheezing, no crackles. Normal respiratory effort. No accessory muscle use.  Cardiovascular: Regular rate and rhythm, no murmurs / rubs / gallops. No extremity edema. 2+ pedal pulses. No carotid bruits.  Abdomen: no tenderness, no masses palpated. No  hepatosplenomegaly. Bowel sounds positive.  Musculoskeletal: no clubbing / cyanosis. No joint deformity upper and lower extremities. Good ROM, no contractures. Normal muscle tone.  Skin: no rashes, lesions, ulcers. No induration Neurologic: CN 2-12 grossly intact. Sensation intact, DTR normal. Strength 5/5 in all 4.  Psychiatric: Normal judgment and insight. Alert and oriented x 3. Normal mood.    Labs on Admission: I have personally reviewed following labs and imaging studies  CBC: Recent Labs  Lab 05/04/20 2241  WBC 6.6  HGB 16.1  HCT 48.0  MCV 101.1*  PLT 278   Basic Metabolic Panel: Recent Labs  Lab 05/04/20 2241 05/05/20 0306  NA 142 138  K 4.4 4.4  CL 102 107  CO2 14* 18*  GLUCOSE 73 69*  BUN 16 12  CREATININE 0.93 0.72  CALCIUM 8.7* 7.2*   GFR: Estimated Creatinine Clearance: 135.6 mL/min (by C-G formula based on SCr of 0.72 mg/dL). Liver Function Tests: Recent Labs  Lab 05/04/20 2241  AST 197*  ALT 156*  ALKPHOS 60  BILITOT 1.1  PROT 7.8  ALBUMIN 4.6   Recent Labs  Lab 05/04/20 2241  LIPASE 77*   No results for input(s): AMMONIA in the last 168 hours. Coagulation Profile: Recent Labs  Lab 05/04/20 2253  INR 1.0   Cardiac Enzymes: No results for input(s): CKTOTAL, CKMB, CKMBINDEX, TROPONINI in the last 168 hours. BNP (last 3 results) No results for input(s): PROBNP in the last 8760 hours. HbA1C: No results for input(s): HGBA1C in the last 72 hours. CBG: No results for input(s): GLUCAP in the last 168 hours. Lipid Profile: No results for input(s): CHOL, HDL, LDLCALC, TRIG, CHOLHDL, LDLDIRECT in the last 72 hours. Thyroid Function Tests: No results for input(s): TSH, T4TOTAL, FREET4, T3FREE, THYROIDAB in the last 72 hours. Anemia Panel: No results for input(s): VITAMINB12, FOLATE, FERRITIN, TIBC, IRON, RETICCTPCT in the last 72 hours. Urine analysis:    Component Value Date/Time   COLORURINE YELLOW 05/05/2020 0123   APPEARANCEUR CLEAR  05/05/2020 0123   APPEARANCEUR Clear 11/15/2014 1755   LABSPEC 1.021 05/05/2020 0123   LABSPEC 1.002 11/15/2014 1755   PHURINE 5.0 05/05/2020 0123   GLUCOSEU NEGATIVE 05/05/2020 0123   GLUCOSEU Negative 11/15/2014 1755   HGBUR SMALL (A) 05/05/2020 0123   BILIRUBINUR NEGATIVE 05/05/2020 0123   BILIRUBINUR Negative 11/15/2014 1755   KETONESUR 80 (A) 05/05/2020 0123   PROTEINUR 100 (A) 05/05/2020 0123   NITRITE NEGATIVE 05/05/2020 0123   LEUKOCYTESUR NEGATIVE 05/05/2020 0123   LEUKOCYTESUR Negative 11/15/2014 1755    Radiological Exams on Admission: DG Chest Portable 1 View  Result Date: 05/04/2020 CLINICAL DATA:  Cough EXAM: PORTABLE CHEST 1 VIEW COMPARISON:  None. FINDINGS: The heart size and mediastinal contours are within normal limits. Both lungs are clear. The visualized skeletal structures are unremarkable. IMPRESSION: No active disease. Electronically Signed   By: Charlett Nose M.D.  On: 05/04/2020 23:15    EKG: Independently reviewed.  Assessment/Plan Principal Problem:   Alcoholic ketoacidosis Active Problems:   HIV disease ([place])   Alcohol dependence (HCC)   Melena   Fever    1. EtOH ketoacidosis - 1. Cont D5 half 2. Got 3.1L bolus 3. Lactate down to 3.6 from 6.5 on repeat. 4. BMP Q4H though looks like AG already improved 5. Tele monitor 2. Melena - 1. Repeat CBC in AM 2. On PPI GTT started in ED 3. Call GI in AM 3. HIV - 1. Cont HAART 4. Fever - 1. Got broad spectrum ABx in ED 2. Getting BCx 3. Will hold off on further ABx for the moment, not clear that he is at all septic 4. Repeat CBC in AM 5. EtOH abuse - 1. CIWA  DVT prophylaxis: SCDs Code Status: Full Family Communication: No family in room Disposition Plan: Home after EtOH ketoacidosis resolved and melena workup Consults called: None, call GI in AM Admission status: Admit to inpatient  Severity of Illness: The appropriate patient status for this patient is INPATIENT. Inpatient status is  judged to be reasonable and necessary in order to provide the required intensity of service to ensure the patient's safety. The patient's presenting symptoms, physical exam findings, and initial radiographic and laboratory data in the context of their chronic comorbidities is felt to place them at high risk for further clinical deterioration. Furthermore, it is not anticipated that the patient will be medically stable for discharge from the hospital within 2 midnights of admission. The following factors support the patient status of inpatient.   IP status for alcoholic ketoacidosis.  Also work up of melena.   * I certify that at the point of admission it is my clinical judgment that the patient will require inpatient hospital care spanning beyond 2 midnights from the point of admission due to high intensity of service, high risk for further deterioration and high frequency of surveillance required.*    GARDNER, JARED M. DO [facility]  How to contact the [facility] Attending or Consulting provider [place] or covering provider during after hours [place], for this patient?  1. Check the care team in [facility] and look for a) attending/consulting [facility] provider listed and b) the [facility] team listed 2. Log into [email or web address]  [employer] Physician Scheduling and messaging for groups and whole hospitals  On call and physician scheduling software for group practices, residents, hospitalists and other medical providers for call, clinic, rotation and shift schedules. OnCall [employer] is a hospital-wide system for scheduling doctors and paging doctors on call. EasyPlot is for scientific plotting and data analysis.  [email or web address]  and use [place]'s universal password to access. If you do not have the password, please contact the hospital operator.  3. Locate the [facility] provider you are looking for under [facility] and page to a number that you can be directly reached. 4. If you still have difficulty reaching the  provider, please page the [facility] (Director on Call) for the Hospitalists listed on amion for assistance.  05/05/2020, 3:53 AM

## 2020-05-05 NOTE — Progress Notes (Signed)
Sepsis note:  LA collected, pending result. BC drawn before ABX administered. Received expected sepsis protocol fluid 2,190 ml IVF bolus. No hypotension noted.   Lynden Flemmer DNP eLink RN 01:57 AM

## 2020-05-06 DIAGNOSIS — E872 Acidosis: Principal | ICD-10-CM

## 2020-05-06 DIAGNOSIS — B20 Human immunodeficiency virus [HIV] disease: Secondary | ICD-10-CM

## 2020-05-06 DIAGNOSIS — F10230 Alcohol dependence with withdrawal, uncomplicated: Secondary | ICD-10-CM

## 2020-05-06 DIAGNOSIS — K921 Melena: Secondary | ICD-10-CM

## 2020-05-06 DIAGNOSIS — F10229 Alcohol dependence with intoxication, unspecified: Secondary | ICD-10-CM

## 2020-05-06 DIAGNOSIS — K7010 Alcoholic hepatitis without ascites: Secondary | ICD-10-CM

## 2020-05-06 DIAGNOSIS — K922 Gastrointestinal hemorrhage, unspecified: Secondary | ICD-10-CM

## 2020-05-06 LAB — COMPREHENSIVE METABOLIC PANEL
ALT: 114 U/L — ABNORMAL HIGH (ref 0–44)
AST: 117 U/L — ABNORMAL HIGH (ref 15–41)
Albumin: 3.5 g/dL (ref 3.5–5.0)
Alkaline Phosphatase: 47 U/L (ref 38–126)
Anion gap: 13 (ref 5–15)
BUN: <5 mg/dL — ABNORMAL LOW (ref 6–20)
CO2: 22 mmol/L (ref 22–32)
Calcium: 8.2 mg/dL — ABNORMAL LOW (ref 8.9–10.3)
Chloride: 98 mmol/L (ref 98–111)
Creatinine, Ser: 0.59 mg/dL — ABNORMAL LOW (ref 0.61–1.24)
GFR calc Af Amer: >60 mL/min (ref >60)
GFR calc non Af Amer: >60 mL/min (ref >60)
Glucose, Bld: 163 mg/dL — ABNORMAL HIGH (ref 70–99)
Potassium: 3.3 mmol/L — ABNORMAL LOW (ref 3.5–5.1)
Sodium: 133 mmol/L — ABNORMAL LOW (ref 135–145)
Total Bilirubin: 1.7 mg/dL — ABNORMAL HIGH (ref 0.3–1.2)
Total Protein: 5.8 g/dL — ABNORMAL LOW (ref 6.5–8.1)

## 2020-05-06 LAB — CBC
HCT: 40.7 % (ref 39.0–52.0)
Hemoglobin: 14.2 g/dL (ref 13.0–17.0)
MCH: 33.8 pg (ref 26.0–34.0)
MCHC: 34.9 g/dL (ref 30.0–36.0)
MCV: 96.9 fL (ref 80.0–100.0)
Platelets: 138 10*3/uL — ABNORMAL LOW (ref 150–400)
RBC: 4.2 MIL/uL — ABNORMAL LOW (ref 4.22–5.81)
RDW: 13.8 % (ref 11.5–15.5)
WBC: 3.3 10*3/uL — ABNORMAL LOW (ref 4.0–10.5)
nRBC: 0 % (ref 0.0–0.2)

## 2020-05-06 LAB — URINE CULTURE: Culture: NO GROWTH

## 2020-05-06 LAB — MAGNESIUM: Magnesium: 1.5 mg/dL — ABNORMAL LOW (ref 1.7–2.4)

## 2020-05-06 LAB — TROPONIN I (HIGH SENSITIVITY): Troponin I (High Sensitivity): 8 ng/L (ref <18)

## 2020-05-06 MED ORDER — LORAZEPAM 2 MG/ML IJ SOLN
4.0000 mg | INTRAMUSCULAR | Status: DC
  Administered 2020-05-06 – 2020-05-07 (×5): 4 mg via INTRAVENOUS
  Filled 2020-05-06 (×5): qty 2

## 2020-05-06 MED ORDER — POTASSIUM CHLORIDE 10 MEQ/100ML IV SOLN
10.0000 meq | INTRAVENOUS | Status: AC
  Administered 2020-05-06 (×4): 10 meq via INTRAVENOUS
  Filled 2020-05-06 (×4): qty 100

## 2020-05-06 MED ORDER — POTASSIUM CHLORIDE 10 MEQ/100ML IV SOLN
10.0000 meq | INTRAVENOUS | Status: AC
  Administered 2020-05-06 (×2): 10 meq via INTRAVENOUS
  Filled 2020-05-06 (×2): qty 100

## 2020-05-06 MED ORDER — MAGNESIUM SULFATE 4 GM/100ML IV SOLN
4.0000 g | Freq: Once | INTRAVENOUS | Status: AC
  Administered 2020-05-06: 4 g via INTRAVENOUS
  Filled 2020-05-06: qty 100

## 2020-05-06 NOTE — Progress Notes (Signed)
[facility] [facility] ROUNDING NOTE   Subjective: On CIWA protocol for active alcohol withdrawal No further episodes of melena or hematemesis Complains of nausea and generalized abdominal pain  Objective: Vital signs in last 24 hours: Temp:  [98.2 F (36.8 C)-99.1 F (37.3 C)] 98.2 F (36.8 C) (06/28 0800) Pulse Rate:  [77-108] 90 (06/28 0823) Resp:  [16-26] 17 (06/28 0823) BP: (106-143)/(40-83) 128/81 (06/28 0823) SpO2:  [95 %-98 %] 96 % (06/28 0823) Last BM Date: 05/06/20 General: NAD, alert oriented x3, tremors  abdomen: Soft, no distention or tenderness.  No rebound Ext: No edema    Intake/Output from previous day: 06/27 0701 - 06/28 0700 In: 2667.5 [P.O.:480; I.V.:1944.6; IV Piggyback:242.9] Out: 1600 [Urine:1600] Intake/Output this shift: No intake/output data recorded.   Lab Results: Recent Labs    05/04/20 2241 05/05/20 0500 05/06/20 0343  WBC 6.6 4.6 3.3*  HGB 16.1 13.1 14.2  PLT 278 171 138*  MCV 101.1* 101.5* 96.9   BMET Recent Labs    05/05/20 0550 05/05/20 1017 05/06/20 0025  NA 137 136 133*  K 3.5 3.5 3.3*  CL 105 105 98  CO2 20* 21* 22  GLUCOSE 91 103* 163*  BUN 11 11 <5*  CREATININE 0.65 0.54* 0.59*  CALCIUM 7.5* 8.0* 8.2*   LFT Recent Labs    05/04/20 2241 05/05/20 0500 05/06/20 0025  PROT 7.8 5.6* 5.8*  ALBUMIN 4.6 3.4* 3.5  AST 197* 122* 117*  ALT 156* 110* 114*  ALKPHOS 60 44 47  BILITOT 1.1 1.0 1.7*  BILIDIR  --  0.1  --   IBILI  --  0.9  --    PT/INR Recent Labs    05/04/20 2253  INR 1.0      Imaging/Other results: DG Chest Portable 1 View  Result Date: 05/04/2020 CLINICAL DATA:  Cough EXAM: PORTABLE CHEST 1 VIEW COMPARISON:  None. FINDINGS: The heart size and mediastinal contours are within normal limits. Both lungs are clear. The visualized skeletal structures are unremarkable. IMPRESSION: No active disease. Electronically Signed   By: Charlett Nose M.D.   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  Continue Protonix 40 mg IV twice daily for now, can transition to oral Protonix once tolerating oral intake Monitor for alcohol withdrawal  Advance diet as tolerated  GI will sign off, available if have any questions    K. Scherry Ran , MD [phone]  [facility]

## 2020-05-06 NOTE — Progress Notes (Signed)
Pt had moderate brown bm. No melena . Melodye Ped

## 2020-05-06 NOTE — Progress Notes (Signed)
Initial Nutrition Assessment  DOCUMENTATION CODES:   Non-severe (moderate) malnutrition in context of social or environmental circumstances  INTERVENTION:  Monitor for diet advancement and order supplements as appropriate.  When diet is advanced, recommend Ensure Enlive po TID, each supplement provides 350 kcal and 20 grams of protein  NUTRITION DIAGNOSIS:   Moderate Malnutrition related to social / environmental circumstances as evidenced by energy intake < 75% for > or equal to 3 months, mild muscle depletion, mild fat depletion, moderate muscle depletion, moderate fat depletion   GOAL:   Patient will meet greater than or equal to 90% of their needs    MONITOR:   Diet advancement, Weight trends, Labs, I & O's  REASON FOR ASSESSMENT:   Malnutrition Screening Tool    ASSESSMENT:   Pt admitted with alcoholic ketoacidosis. PMH includes HIV and EtOH abuse.  Pt unable to provide detailed history at this time, but does state he has been eating very little for several months and receiving the majority of his calories via EtOH. Per H&P, pt drinks 1-2 fifths of vodka per day and has daily vomiting.   Differential for pt includes gastritis, gastroduodenal ulcer, and esophagitis.  Per MD, pt in withdrawal and will need EGD once medically stable.   UOP: 1,622ml x24 hours I/O: +1,528.76ml since admit  Labs: Na 133 (L), K+ 3.3 (L), Mg 1.5 (L), Corrected Ca 8.6 (L) Medications: Boost Breeze po TID, Protonix, Thiamine, D5-1/2 NS @ 132ml/hr, KCl   NUTRITION - FOCUSED PHYSICAL EXAM:    Most Recent Value  Orbital Region Moderate depletion  Upper Arm Region Mild depletion  Thoracic and Lumbar Region Mild depletion  Buccal Region No depletion  Temple Region Mild depletion  Clavicle Bone Region Mild depletion  Clavicle and Acromion Bone Region Mild depletion  Scapular Bone Region Mild depletion  Dorsal Hand No depletion  Patellar Region Moderate depletion  Anterior Thigh  Region Moderate depletion  Posterior Calf Region Mild depletion  Edema (RD Assessment) None  Hair Reviewed  Eyes Reviewed  Mouth Reviewed  Skin Reviewed  Nails Reviewed       Diet Order:   Diet Order    None      EDUCATION NEEDS:   Not appropriate for education at this time  Skin:  Skin Assessment: Reviewed RN Assessment  Last BM:  6/28  Height:   Ht Readings from Last 1 Encounters:  05/05/20 6\' 1"  (1.854 m)    Weight:   Wt Readings from Last 1 Encounters:  05/05/20 73 kg    BMI:  Body mass index is 21.24 kg/m.  Estimated Nutritional Needs:   Kcal:  2200-2400  Protein:  110-125 grams  Fluid:  >2L/d    05/07/20, MS, RD, LDN RD pager number and weekend/on-call pager number located in Amion.

## 2020-05-06 NOTE — Progress Notes (Signed)
PROGRESS NOTE    Gary [name]  ZOX:[id number] DOB: [birth year] DOA: 05/04/2020 PCP: Patient, No Pcp Per   Brief Narrative:  34 y.o. male with medical history significant of HIV, EtOH abus admitted to the hospital for melanotic stool and alcohol withdrawal.  Started on alcohol withdrawal protocol, Hemoccult was positive.  GI was consulted who recommended PPI twice daily otherwise no further endoscopic evaluation.   Assessment & Plan:   Principal Problem:   Alcoholic ketoacidosis Active Problems:   HIV disease (HCC)   Alcohol dependence (HCC)   Melena   Fever   Gastrointestinal hemorrhage   Alcoholic hepatitis without ascites  Alcohol abuse with ketosis Alcohol withdrawal with severe agitation -Librium taper, scheduled and as needed Ativan.  Alcohol withdrawal protocol -Replete electrolytes, IV fluids supportive care -If this fails he may require to be on Precedex drip  Hypokalemia/hypomagnesemia -Aggressive repletion  Melanotic stool, suspect upper GI bleed -Hemodynamically stable.  Suspect gastritis/esophagitis.  No endoscopic evaluation.  GI, recommending PPI therapy.  History of HIV -Continue home medication   DVT prophylaxis: SCDs Code Status: Full Family Communication: None at bedside  Status is: Inpatient  Remains inpatient appropriate because:Inpatient level of care appropriate due to severity of illness   Dispo: The patient is from: Home              Anticipated d/c is to: Home              Anticipated d/c date is: 1 day              Patient currently is not medically stable to d/c.  Patient has quite elevated CIWA score-24 requires inpatient treatment for his alcohol withdrawal.  Unsafe for discharge.   Body mass index is 21.24 kg/m.    Subjective: During my evaluation this morning he reported of left upper extremity tingling in his fingers shooting up from the elbow later on he became agitated.  Had multiple dark stools overnight but  hemodynamically remained stable  Review of Systems Otherwise negative except as per HPI, including: General: Denies fever, chills, night sweats or unintended weight loss. Resp: Denies cough, wheezing, shortness of breath. Cardiac: Denies chest pain, palpitations, orthopnea, paroxysmal nocturnal dyspnea. GI: Denies abdominal pain, nausea, vomiting, diarrhea or constipation GU: Denies dysuria, frequency, hesitancy or incontinence MS: Denies muscle aches, joint pain or swelling Neuro: Denies headache, neurologic deficits (focal weakness, numbness, tingling), abnormal gait Psych: Denies anxiety, depression, SI/HI/AVH Skin: Denies new rashes or lesions ID: Denies sick contacts, exotic exposures, travel  Examination: Constitutional: Not in acute distress Respiratory: Clear to auscultation bilaterally Cardiovascular: Normal sinus rhythm, no rubs Abdomen: Nontender nondistended good bowel sounds Musculoskeletal: No edema noted Skin: No rashes seen Neurologic: CN 2-12 grossly intact.  And nonfocal Psychiatric: Normal judgment and insight. Alert and oriented x 3.  Overall anxious  Objective: Vitals:   05/06/20 0500 05/06/20 0600 05/06/20 0800 05/06/20 0823  BP: 136/81 130/67  128/81  Pulse: 83 77  90  Resp: (!) 21 (!) 22  17  Temp:   98.2 F (36.8 C)   TempSrc:   Oral   SpO2: 96% 96%  96%  Weight:      Height:        Intake/Output Summary (Last 24 hours) at 05/06/2020 1024 Last data filed at 05/06/2020 0549 Gross per 24 hour  Intake 2667.47 ml  Output 1600 ml  Net 1067.47 ml   Filed Weights   05/04/20 2339 05/05/20 0155  Weight: 73 kg 73 kg  Data Reviewed:   CBC: Recent Labs  Lab 05/04/20 2241 05/05/20 0500 05/06/20 0343  WBC 6.6 4.6 3.3*  HGB 16.1 13.1 14.2  HCT 48.0 39.6 40.7  MCV 101.1* 101.5* 96.9  PLT 278 171 138*   Basic Metabolic Panel: Recent Labs  Lab 05/04/20 2241 05/05/20 0306 05/05/20 0550 05/05/20 1017 05/06/20 0025  NA 142 138 137 136  133*  K 4.4 4.4 3.5 3.5 3.3*  CL 102 107 105 105 98  CO2 14* 18* 20* 21* 22  GLUCOSE 73 69* 91 103* 163*  BUN 16 12 11 11  <5*  CREATININE 0.93 0.72 0.65 0.54* 0.59*  CALCIUM 8.7* 7.2* 7.5* 8.0* 8.2*  MG  --   --   --   --  1.5*   GFR: Estimated Creatinine Clearance: 135.6 mL/min (A) (by C-G formula based on SCr of 0.59 mg/dL (L)). Liver Function Tests: Recent Labs  Lab 05/04/20 2241 05/05/20 0500 05/06/20 0025  AST 197* 122* 117*  ALT 156* 110* 114*  ALKPHOS 60 44 47  BILITOT 1.1 1.0 1.7*  PROT 7.8 5.6* 5.8*  ALBUMIN 4.6 3.4* 3.5   Recent Labs  Lab 05/04/20 2241  LIPASE 77*   No results for input(s): AMMONIA in the last 168 hours. Coagulation Profile: Recent Labs  Lab 05/04/20 2253  INR 1.0   Cardiac Enzymes: No results for input(s): CKTOTAL, CKMB, CKMBINDEX, TROPONINI in the last 168 hours. BNP (last 3 results) No results for input(s): PROBNP in the last 8760 hours. HbA1C: No results for input(s): HGBA1C in the last 72 hours. CBG: No results for input(s): GLUCAP in the last 168 hours. Lipid Profile: No results for input(s): CHOL, HDL, LDLCALC, TRIG, CHOLHDL, LDLDIRECT in the last 72 hours. Thyroid Function Tests: No results for input(s): TSH, T4TOTAL, FREET4, T3FREE, THYROIDAB in the last 72 hours. Anemia Panel: No results for input(s): VITAMINB12, FOLATE, FERRITIN, TIBC, IRON, RETICCTPCT in the last 72 hours. Sepsis Labs: Recent Labs  Lab 05/04/20 2253 05/05/20 0223 05/05/20 1017 05/05/20 1102  LATICACIDVEN 6.5* 3.6* 2.0* 1.7    Recent Results (from the past 240 hour(s))  Blood culture (routine x 2)     Status: None (Preliminary result)   Collection Time: 05/04/20 10:53 PM   Specimen: BLOOD  Result Value Ref Range Status   Specimen Description   Final    BLOOD RIGHT ANTECUBITAL Performed at [facility], [address], [place], [place] [address]    Special Requests   Final    BOTTLES DRAWN AEROBIC AND ANAEROBIC Blood Culture  results may not be optimal due to an excessive volume of blood received in culture bottles Performed at [facility], [address], [place], [place] [address]    Culture   Final    NO GROWTH < 12 HOURS Performed at [facility], [address], [place], [place] [address]    Report Status PENDING  Incomplete  Blood culture (routine x 2)     Status: None (Preliminary result)   Collection Time: 05/04/20 10:58 PM   Specimen: BLOOD RIGHT HAND  Result Value Ref Range Status   Specimen Description   Final    BLOOD RIGHT HAND Performed at [facility], [address], [place], [place] [address]    Special Requests   Final    BOTTLES DRAWN AEROBIC AND ANAEROBIC Blood Culture adequate volume Performed at [facility], [address] [place], [place] [address]    Culture  Final    NO GROWTH < 12 HOURS Performed at [facility], [address] [place], [place] [address]    Report Status PENDING  Incomplete  Urine culture     Status: None   Collection Time: 05/04/20 11:00 PM   Specimen: In/Out Cath Urine  Result Value Ref Range Status   Specimen Description   Final    IN/OUT CATH URINE Performed at [facility], [address], [place], [place] [address]    Special Requests   Final    NONE Performed at [facility], [address], [place], [place] [address]    Culture   Final    NO GROWTH Performed at [facility], [address], [place], [place] [address]    Report Status 05/06/2020 FINAL  Final  SARS Coronavirus 2 by RT PCR (hospital order, performed in [facility] hospital lab) Nasopharyngeal Nasopharyngeal Swab     Status: None   Collection Time: 05/04/20 11:00 PM   Specimen: Nasopharyngeal Swab  Result Value Ref Range Status   SARS Coronavirus 2 NEGATIVE NEGATIVE Final    Comment: (NOTE) SARS-CoV-2 target nucleic acids are NOT DETECTED.  The SARS-CoV-2 RNA  is generally detectable in upper and lower respiratory specimens during the acute phase of infection. The lowest concentration of SARS-CoV-2 viral copies this assay can detect is 250 copies / mL. A negative result does not preclude SARS-CoV-2 infection and should not be used as the sole basis for treatment or other patient management decisions.  A negative result may occur with improper specimen collection / handling, submission of specimen other than nasopharyngeal swab, presence of viral mutation(s) within the areas targeted by this assay, and inadequate number of viral copies (<250 copies / mL). A negative result must be combined with clinical observations, patient history, and epidemiological information.  Fact Sheet for Patients:   BoilerBrush.com.cy  Fact Sheet for Healthcare Providers: https://pope.com/  This test is not yet approved or  cleared by the [place] FDA and has been authorized for detection and/or diagnosis of SARS-CoV-2 by FDA under an Emergency Use Authorization (EUA).  This EUA will remain in effect (meaning this test can be used) for the duration of the COVID-19 declaration under Section 564(b)(1) of the Act, 21 U.S.C. section 360bbb-3(b)(1), unless the authorization is terminated or revoked sooner.  Performed at [facility], [address] [place], [place] [address]          Radiology Studies: DG Chest Portable 1 View  Result Date: 05/04/2020 CLINICAL DATA:  Cough EXAM: PORTABLE CHEST 1 VIEW COMPARISON:  None. FINDINGS: The heart size and mediastinal contours are within normal limits. Both lungs are clear. The visualized skeletal structures are unremarkable. IMPRESSION: No active disease. Electronically Signed   By: Charlett Nose M.D.   On: 05/04/2020 23:15        Scheduled Meds: . chlordiazePOXIDE  25 mg Oral BID   Followed by  . [START ON 05/07/2020] chlordiazePOXIDE  25 mg  Oral Once  . Chlorhexidine Gluconate Cloth  6 each Topical Daily  . elvitegravir-cobicistat-emtricitabine-tenofovir  1 tablet Oral Q breakfast  . feeding supplement  1 Container Oral TID BM  . LORazepam  0-4 mg Intravenous Q6H   Or  . LORazepam  0-4 mg Oral Q6H  . [START ON 05/07/2020] LORazepam  0-4 mg Intravenous Q12H   Or  . [START ON 05/07/2020] LORazepam  0-4 mg Oral Q12H  . LORazepam  4  mg Intravenous Q4H  . mouth rinse  15 mL Mouth Rinse BID  . mupirocin ointment  1 application Nasal BID  . pantoprazole (PROTONIX) IV  40 mg Intravenous Q12H  . thiamine  100 mg Oral Daily   Or  . thiamine  100 mg Intravenous Daily   Continuous Infusions: . dextrose 5 % and 0.45% NaCl 100 mL/hr at 05/06/20 0500  . potassium chloride 10 mEq (05/06/20 0925)     LOS: 1 day   Time spent= 35 mins    Ervey Fallin Joline Maxcy, MD [facility]  If 7PM-7AM, please contact night-coverage  05/06/2020, 10:24 AM

## 2020-05-07 DIAGNOSIS — E440 Moderate protein-calorie malnutrition: Secondary | ICD-10-CM | POA: Insufficient documentation

## 2020-05-07 LAB — CBC
HCT: 39.9 % (ref 39.0–52.0)
Hemoglobin: 13.7 g/dL (ref 13.0–17.0)
MCH: 33.3 pg (ref 26.0–34.0)
MCHC: 34.3 g/dL (ref 30.0–36.0)
MCV: 96.8 fL (ref 80.0–100.0)
Platelets: 136 10*3/uL — ABNORMAL LOW (ref 150–400)
RBC: 4.12 MIL/uL — ABNORMAL LOW (ref 4.22–5.81)
RDW: 13.9 % (ref 11.5–15.5)
WBC: 3.3 10*3/uL — ABNORMAL LOW (ref 4.0–10.5)
nRBC: 0 % (ref 0.0–0.2)

## 2020-05-07 LAB — COMPREHENSIVE METABOLIC PANEL
ALT: 122 U/L — ABNORMAL HIGH (ref 0–44)
AST: 116 U/L — ABNORMAL HIGH (ref 15–41)
Albumin: 3.6 g/dL (ref 3.5–5.0)
Alkaline Phosphatase: 56 U/L (ref 38–126)
Anion gap: 5 (ref 5–15)
BUN: <5 mg/dL — ABNORMAL LOW (ref 6–20)
CO2: 29 mmol/L (ref 22–32)
Calcium: 8.6 mg/dL — ABNORMAL LOW (ref 8.9–10.3)
Chloride: 100 mmol/L (ref 98–111)
Creatinine, Ser: 0.6 mg/dL — ABNORMAL LOW (ref 0.61–1.24)
GFR calc Af Amer: >60 mL/min (ref >60)
GFR calc non Af Amer: >60 mL/min (ref >60)
Glucose, Bld: 125 mg/dL — ABNORMAL HIGH (ref 70–99)
Potassium: 3.4 mmol/L — ABNORMAL LOW (ref 3.5–5.1)
Sodium: 134 mmol/L — ABNORMAL LOW (ref 135–145)
Total Bilirubin: 0.9 mg/dL (ref 0.3–1.2)
Total Protein: 6.1 g/dL — ABNORMAL LOW (ref 6.5–8.1)

## 2020-05-07 LAB — MAGNESIUM: Magnesium: 2 mg/dL (ref 1.7–2.4)

## 2020-05-07 MED ORDER — ENSURE ENLIVE PO LIQD
237.0000 mL | Freq: Three times a day (TID) | ORAL | Status: DC
  Administered 2020-05-07 – 2020-05-08 (×4): 237 mL via ORAL

## 2020-05-07 MED ORDER — PANTOPRAZOLE SODIUM 40 MG PO TBEC
40.0000 mg | DELAYED_RELEASE_TABLET | Freq: Two times a day (BID) | ORAL | Status: DC
  Administered 2020-05-07 – 2020-05-09 (×5): 40 mg via ORAL
  Filled 2020-05-07 (×5): qty 1

## 2020-05-07 MED ORDER — CHLORDIAZEPOXIDE HCL 25 MG PO CAPS
25.0000 mg | ORAL_CAPSULE | Freq: Two times a day (BID) | ORAL | Status: AC
  Administered 2020-05-07 (×2): 25 mg via ORAL
  Filled 2020-05-07 (×3): qty 1

## 2020-05-07 MED ORDER — SODIUM CHLORIDE 0.9 % IV BOLUS
500.0000 mL | Freq: Once | INTRAVENOUS | Status: AC
  Administered 2020-05-07: 500 mL via INTRAVENOUS

## 2020-05-07 MED ORDER — SUCRALFATE 1 GM/10ML PO SUSP
1.0000 g | Freq: Three times a day (TID) | ORAL | Status: DC
  Administered 2020-05-07 – 2020-05-09 (×10): 1 g via ORAL
  Filled 2020-05-07 (×10): qty 10

## 2020-05-07 MED ORDER — CHLORDIAZEPOXIDE HCL 25 MG PO CAPS
25.0000 mg | ORAL_CAPSULE | Freq: Once | ORAL | Status: AC
  Administered 2020-05-07: 25 mg via ORAL

## 2020-05-07 NOTE — Progress Notes (Signed)
PROGRESS NOTE    Gary [name]  XLK:[id number] DOB: Apr 18, 1986 DOA: 05/04/2020 PCP: Patient, No Pcp Per   Brief Narrative:  34 y.o. male with medical history significant of HIV, EtOH abus admitted to the hospital for melanotic stool and alcohol withdrawal.  Started on alcohol withdrawal protocol, Hemoccult was positive.  GI was consulted who recommended PPI twice daily otherwise no further endoscopic evaluation.   Assessment & Plan:   Principal Problem:   Alcoholic ketoacidosis Active Problems:   HIV disease (HCC)   Alcohol dependence (HCC)   Melena   Fever   Gastrointestinal hemorrhage   Alcoholic hepatitis without ascites   Malnutrition of moderate degree  Alcohol abuse with ketosis Alcohol withdrawal with severe agitation -Patient is drowsy this morning.  Discontinue scheduled Ativan.  Continue Librium taper and CIWA -Replete electrolytes, IV fluids supportive care -CM team to give patient resources  Hypokalemia/hypomagnesemia -Replete as appropriate  Melanotic stool, suspect upper GI bleed -Hemodynamically stable.  Suspect gastritis/esophagitis.  No endoscopic evaluation.  GI, recommending PPI therapy.  Carafate added  History of HIV -Continue home medication   DVT prophylaxis: SCDs Code Status: Full Family Communication: None at bedside  Status is: Inpatient  Remains inpatient appropriate because:Inpatient level of care appropriate due to severity of illness   Dispo: The patient is from: Home              Anticipated d/c is to: Home              Anticipated d/c date is: 1 day              Patient currently is not medically stable to d/c.  Still drowsy this morning therefore will discontinue IV Ativan.  Continue Librium taper.  Hopefully discharge tomorrow day after if he remains stable from any symptoms of alcohol withdrawal as we back off benzodiazepine.   Body mass index is 21.24 kg/m.    Subjective: Drowsy during my evaluation but arousable.   Yesterday had bilateral upper extremity shaking and other signs of withdrawal therefore placed on scheduled Ativan.  Review of Systems Otherwise negative except as per HPI, including: General: Denies fever, chills, night sweats or unintended weight loss. Resp: Denies cough, wheezing, shortness of breath. Cardiac: Denies chest pain, palpitations, orthopnea, paroxysmal nocturnal dyspnea. GI: Denies abdominal pain, nausea, vomiting, diarrhea or constipation GU: Denies dysuria, frequency, hesitancy or incontinence MS: Denies muscle aches, joint pain or swelling Neuro: Denies headache, neurologic deficits (focal weakness, numbness, tingling), abnormal gait Psych: Denies anxiety, depression, SI/HI/AVH Skin: Denies new rashes or lesions ID: Denies sick contacts, exotic exposures, travel   Examination: Constitutional: Drowsy but easily arousable Respiratory: Clear to auscultation bilaterally Cardiovascular: Normal sinus rhythm, no rubs Abdomen: Nontender nondistended good bowel sounds Musculoskeletal: No edema noted Skin: No rashes seen Neurologic: CN 2-12 grossly intact.  And nonfocal.  Slightly tremulous  psychiatric: Alert awake oriented X3.  Poor judgment and insight.  Objective: Vitals:   05/07/20 0400 05/07/20 0600 05/07/20 0800 05/07/20 0827  BP: 106/72 107/79 102/64   Pulse: 94 81 73   Resp: (!) 22 (!) 24 (!) 22   Temp: 98.4 F (36.9 C)   98.5 F (36.9 C)  TempSrc: Oral   Axillary  SpO2: 95% 94% 95%   Weight:      Height:        Intake/Output Summary (Last 24 hours) at 05/07/2020 0957 Last data filed at 05/07/2020 0800 Gross per 24 hour  Intake 3164.18 ml  Output 3200 ml  Net -35.82 ml   Filed Weights   05/04/20 2339 05/05/20 0155  Weight: 73 kg 73 kg     Data Reviewed:   CBC: Recent Labs  Lab 05/04/20 2241 05/05/20 0500 05/06/20 0343 05/07/20 0252  WBC 6.6 4.6 3.3* 3.3*  HGB 16.1 13.1 14.2 13.7  HCT 48.0 39.6 40.7 39.9  MCV 101.1* 101.5* 96.9 96.8    PLT 278 171 138* 136*   Basic Metabolic Panel: Recent Labs  Lab 05/05/20 0306 05/05/20 0550 05/05/20 1017 05/06/20 0025 05/07/20 0252  NA 138 137 136 133* 134*  K 4.4 3.5 3.5 3.3* 3.4*  CL 107 105 105 98 100  CO2 18* 20* 21* 22 29  GLUCOSE 69* 91 103* 163* 125*  BUN 12 11 11  <5* <5*  CREATININE 0.72 0.65 0.54* 0.59* 0.60*  CALCIUM 7.2* 7.5* 8.0* 8.2* 8.6*  MG  --   --   --  1.5* 2.0   GFR: Estimated Creatinine Clearance: 135.6 mL/min (A) (by C-G formula based on SCr of 0.6 mg/dL (L)). Liver Function Tests: Recent Labs  Lab 05/04/20 2241 05/05/20 0500 05/06/20 0025 05/07/20 0252  AST 197* 122* 117* 116*  ALT 156* 110* 114* 122*  ALKPHOS 60 44 47 56  BILITOT 1.1 1.0 1.7* 0.9  PROT 7.8 5.6* 5.8* 6.1*  ALBUMIN 4.6 3.4* 3.5 3.6   Recent Labs  Lab 05/04/20 2241  LIPASE 77*   No results for input(s): AMMONIA in the last 168 hours. Coagulation Profile: Recent Labs  Lab 05/04/20 2253  INR 1.0   Cardiac Enzymes: No results for input(s): CKTOTAL, CKMB, CKMBINDEX, TROPONINI in the last 168 hours. BNP (last 3 results) No results for input(s): PROBNP in the last 8760 hours. HbA1C: No results for input(s): HGBA1C in the last 72 hours. CBG: No results for input(s): GLUCAP in the last 168 hours. Lipid Profile: No results for input(s): CHOL, HDL, LDLCALC, TRIG, CHOLHDL, LDLDIRECT in the last 72 hours. Thyroid Function Tests: No results for input(s): TSH, T4TOTAL, FREET4, T3FREE, THYROIDAB in the last 72 hours. Anemia Panel: No results for input(s): VITAMINB12, FOLATE, FERRITIN, TIBC, IRON, RETICCTPCT in the last 72 hours. Sepsis Labs: Recent Labs  Lab 05/04/20 2253 05/05/20 0223 05/05/20 1017 05/05/20 1102  LATICACIDVEN 6.5* 3.6* 2.0* 1.7    Recent Results (from the past 240 hour(s))  Blood culture (routine x 2)     Status: None (Preliminary result)   Collection Time: 05/04/20 10:53 PM   Specimen: BLOOD  Result Value Ref Range Status   Specimen Description    Final    BLOOD RIGHT ANTECUBITAL Performed at [facility], [address], [place], [place] [address]    Special Requests   Final    BOTTLES DRAWN AEROBIC AND ANAEROBIC Blood Culture results may not be optimal due to an excessive volume of blood received in culture bottles Performed at [facility], [address] [place], [place] [address]    Culture   Final    NO GROWTH 2 DAYS Performed at [facility], [address], [place], [place] [address]    Report Status PENDING  Incomplete  Blood culture (routine x 2)     Status: None (Preliminary result)   Collection Time: 05/04/20 10:58 PM   Specimen: BLOOD RIGHT HAND  Result Value Ref Range Status   Specimen Description   Final    BLOOD RIGHT HAND Performed at [facility], [address] [place], [place] [address]  Special Requests   Final    BOTTLES DRAWN AEROBIC AND ANAEROBIC Blood Culture adequate volume Performed at [facility], [address] [place], [place] [address]    Culture   Final    NO GROWTH 2 DAYS Performed at [facility], [address], [place], [place] [address]    Report Status PENDING  Incomplete  Urine culture     Status: None   Collection Time: 05/04/20 11:00 PM   Specimen: In/Out Cath Urine  Result Value Ref Range Status   Specimen Description   Final    IN/OUT CATH URINE Performed at [facility], [address], [place], [place] [address]    Special Requests   Final    NONE Performed at [facility], [address], [place], [place] [address]    Culture   Final    NO GROWTH Performed at [facility], [address] [place], [place] [address]    Report Status 05/06/2020 FINAL  Final  SARS Coronavirus 2 by RT PCR (hospital order, performed in [facility] hospital lab) Nasopharyngeal Nasopharyngeal Swab     Status: None   Collection Time: 05/04/20 11:00 PM    Specimen: Nasopharyngeal Swab  Result Value Ref Range Status   SARS Coronavirus 2 NEGATIVE NEGATIVE Final    Comment: (NOTE) SARS-CoV-2 target nucleic acids are NOT DETECTED.  The SARS-CoV-2 RNA is generally detectable in upper and lower respiratory specimens during the acute phase of infection. The lowest concentration of SARS-CoV-2 viral copies this assay can detect is 250 copies / mL. A negative result does not preclude SARS-CoV-2 infection and should not be used as the sole basis for treatment or other patient management decisions.  A negative result may occur with improper specimen collection / handling, submission of specimen other than nasopharyngeal swab, presence of viral mutation(s) within the areas targeted by this assay, and inadequate number of viral copies (<250 copies / mL). A negative result must be combined with clinical observations, patient history, and epidemiological information.  Fact Sheet for Patients:   BoilerBrush.com.cy  Fact Sheet for Healthcare Providers: https://pope.com/  This test is not yet approved or  cleared by the [place] FDA and has been authorized for detection and/or diagnosis of SARS-CoV-2 by FDA under an Emergency Use Authorization (EUA).  This EUA will remain in effect (meaning this test can be used) for the duration of the COVID-19 declaration under Section 564(b)(1) of the Act, 21 U.S.C. section 360bbb-3(b)(1), unless the authorization is terminated or revoked sooner.  Performed at [facility], [address], [place], [place] [address]          Radiology Studies: No results found.      Scheduled Meds: . chlordiazePOXIDE  25 mg Oral BID  . Chlorhexidine Gluconate Cloth  6 each Topical Daily  . elvitegravir-cobicistat-emtricitabine-tenofovir  1 tablet Oral Q breakfast  . feeding supplement  1 Container Oral TID BM  . feeding supplement (ENSURE  ENLIVE)  237 mL Oral TID BM  . LORazepam  0-4 mg Intravenous Q12H   Or  . LORazepam  0-4 mg Oral Q12H  . mouth rinse  15 mL Mouth Rinse BID  . mupirocin ointment  1 application Nasal BID  . pantoprazole  40 mg Oral BID AC  . sucralfate  1 g Oral TID WC & HS  . thiamine  100 mg Oral Daily   Or  . thiamine  100 mg Intravenous  Daily   Continuous Infusions: . dextrose 5 % and 0.45% NaCl 100 mL/hr at 05/07/20 0800     LOS: 2 days   Time spent= 35 mins    Aarib Pulido Joline Maxcy, MD [facility]  If 7PM-7AM, please contact night-coverage  05/07/2020, 9:57 AM

## 2020-05-07 NOTE — TOC Initial Note (Signed)
Transition of Care [facility]) - Initial/Assessment Note    Patient Details  Name: Gary [name] MRN: [id number] Date of Birth: [birth year]  Transition of [facility]) CM/SW Contact:    Golda Acre, RN Phone Number: 05/07/2020, 9:51 AM  Clinical Narrative:                 alcoholic withdrawal and ketoacidosis Plan: will give resources for etoh and substance abuse. Follow for other needs.  Expected Discharge Plan: Home/Self Care Barriers to Discharge: Continued Medical Work up   Patient Goals and CMS Choice Patient states their goals for this hospitalization and ongoing recovery are:: i want to go home CMS Medicare.gov Compare Post Acute Care list provided to:: Patient Choice offered to / list presented to : Patient  Expected Discharge Plan and Services Expected Discharge Plan: Home/Self Care   Discharge Planning Services: CM Consult, Other - See comment (substance abuse resources given.)   Living arrangements for the past 2 months: Single Family Home                                      Prior Living Arrangements/Services Living arrangements for the past 2 months: Single Family Home Lives with:: Self Patient language and need for interpreter reviewed:: No Do you feel safe going back to the place where you live?: Yes      Need for Family Participation in Patient Care: Yes (Comment) Care giver support system in place?: Yes (comment)   Criminal Activity/Legal Involvement Pertinent to Current Situation/Hospitalization: No - Comment as needed  Activities of Daily Living Home Assistive Devices/Equipment: None ADL Screening (condition at time of admission) Patient's cognitive ability adequate to safely complete daily activities?: Yes Is the patient deaf or have difficulty hearing?: No Does the patient have difficulty seeing, even when wearing glasses/contacts?: No Does the patient have difficulty concentrating, remembering, or making decisions?: No Patient able to  express need for assistance with ADLs?: Yes Does the patient have difficulty dressing or bathing?: No Independently performs ADLs?: Yes (appropriate for developmental age) Does the patient have difficulty walking or climbing stairs?: No Weakness of Legs: None Weakness of Arms/Hands: None  Permission Sought/Granted                  Emotional Assessment Appearance:: Appears stated age     Orientation: : Oriented to Self, Oriented to Place, Oriented to  Time, Oriented to Situation Alcohol / Substance Use: Alcohol Use, Tobacco Use Psych Involvement: No (comment)  Admission diagnosis:  Alcoholic ketoacidosis [E87.2] Alcohol withdrawal syndrome without complication (HCC) [F10.230] Gastrointestinal hemorrhage, unspecified gastrointestinal hemorrhage type [K92.2] Patient Active Problem List   Diagnosis Date Noted  . Malnutrition of moderate degree 05/07/2020  . Gastrointestinal hemorrhage   . Alcoholic hepatitis without ascites   . Melena 05/05/2020  . Fever 05/05/2020  . Alcoholic ketoacidosis 05/05/2020  . Alcohol withdrawal (HCC) 04/17/2020  . Thrombocytopenia (HCC) 07/25/2019  . Transaminitis 07/25/2019  . Diarrhea 07/25/2019  . Traumatic subdural hematoma (HCC) 07/02/2019  . Alcohol abuse with alcohol-induced mood disorder (HCC) 05/13/2019  . Alcohol dependence (HCC) 04/16/2019  . 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 HIV disease (HCC) 06/16/2018  . Polysubstance abuse (HCC) 06/16/2018  . Cigarette smoker 06/16/2018   PCP:  Patient, No Pcp Per Pharmacy:   [id number] - [place], [place] - [address] AT [facility] OF  [address] [place] [address] Phone: [phone] Fax: [phone]  [facility] [place], [place] - [address] [place] [address] Phone: [phone] Fax: [phone]     Social  Determinants of Health (SDOH) Interventions    Readmission Risk Interventions Readmission Risk Prevention Plan 06/19/2018  Post Dischage Appt Complete  Medication Screening Complete  Transportation Screening Complete  PCP follow-up Complete  Some recent data might be hidden

## 2020-05-08 LAB — COMPREHENSIVE METABOLIC PANEL
ALT: 140 U/L — ABNORMAL HIGH (ref 0–44)
AST: 132 U/L — ABNORMAL HIGH (ref 15–41)
Albumin: 3.2 g/dL — ABNORMAL LOW (ref 3.5–5.0)
Alkaline Phosphatase: 46 U/L (ref 38–126)
Anion gap: 6 (ref 5–15)
BUN: 6 mg/dL (ref 6–20)
CO2: 26 mmol/L (ref 22–32)
Calcium: 8.8 mg/dL — ABNORMAL LOW (ref 8.9–10.3)
Chloride: 104 mmol/L (ref 98–111)
Creatinine, Ser: 0.54 mg/dL — ABNORMAL LOW (ref 0.61–1.24)
GFR calc Af Amer: >60 mL/min (ref >60)
GFR calc non Af Amer: >60 mL/min (ref >60)
Glucose, Bld: 124 mg/dL — ABNORMAL HIGH (ref 70–99)
Potassium: 3.7 mmol/L (ref 3.5–5.1)
Sodium: 136 mmol/L (ref 135–145)
Total Bilirubin: 0.7 mg/dL (ref 0.3–1.2)
Total Protein: 5.9 g/dL — ABNORMAL LOW (ref 6.5–8.1)

## 2020-05-08 LAB — CBC
HCT: 38.4 % — ABNORMAL LOW (ref 39.0–52.0)
Hemoglobin: 12.9 g/dL — ABNORMAL LOW (ref 13.0–17.0)
MCH: 32.8 pg (ref 26.0–34.0)
MCHC: 33.6 g/dL (ref 30.0–36.0)
MCV: 97.7 fL (ref 80.0–100.0)
Platelets: 124 10*3/uL — ABNORMAL LOW (ref 150–400)
RBC: 3.93 MIL/uL — ABNORMAL LOW (ref 4.22–5.81)
RDW: 14.2 % (ref 11.5–15.5)
WBC: 3 10*3/uL — ABNORMAL LOW (ref 4.0–10.5)
nRBC: 0 % (ref 0.0–0.2)

## 2020-05-08 LAB — MAGNESIUM: Magnesium: 1.9 mg/dL (ref 1.7–2.4)

## 2020-05-08 MED ORDER — PROCHLORPERAZINE EDISYLATE 10 MG/2ML IJ SOLN
10.0000 mg | Freq: Four times a day (QID) | INTRAMUSCULAR | Status: DC | PRN
  Administered 2020-05-08: 10 mg via INTRAVENOUS
  Filled 2020-05-08 (×2): qty 2

## 2020-05-08 MED ORDER — FOLIC ACID 1 MG PO TABS
1.0000 mg | ORAL_TABLET | Freq: Every day | ORAL | Status: DC
  Administered 2020-05-08 – 2020-05-09 (×2): 1 mg via ORAL
  Filled 2020-05-08 (×2): qty 1

## 2020-05-08 MED ORDER — LORAZEPAM 2 MG/ML IJ SOLN: 0.0000 mg | Freq: Two times a day (BID) | INTRAMUSCULAR | Status: DC

## 2020-05-08 MED ORDER — LORAZEPAM 1 MG PO TABS
0.0000 mg | ORAL_TABLET | Freq: Two times a day (BID) | ORAL | Status: DC
  Administered 2020-05-08: 1 mg via ORAL
  Filled 2020-05-08: qty 1

## 2020-05-08 NOTE — Progress Notes (Signed)
PROGRESS NOTE    Gary [name]  ZOX:[id number] DOB: 1986/08/25 DOA: 05/04/2020 PCP: Patient, No Pcp Per   Brief Narrative: 34 y.o.malewith medical history significant ofHIV, EtOH abus admitted to the hospital for melanotic stool and alcohol withdrawal.  Started on alcohol withdrawal protocol, Hemoccult was positive.  GI was consulted who recommended PPI twice daily otherwise no further endoscopic evaluation.  Overall status has improved.  Remains weak, has tremors and in mild/moderate  alcohol withdrawal.  Plan is to transfer him to telemetry, PT evaluation and discharge to home tomorrow.  Assessment & Plan:   Principal Problem:   Alcoholic ketoacidosis Active Problems:   HIV disease (HCC)   Alcohol dependence (HCC)   Melena   Fever   Gastrointestinal hemorrhage   Alcoholic hepatitis without ascites   Malnutrition of moderate degree   Alcohol abuse with ketosis Alcohol withdrawal with severe agitation -Continue Librium taper and CIWA monitoring -TOC consulted for alcohol rehabilitation resources. -Continue folic acid and thiamine.  Hypokalemia/hypomagnesemia -Monitor and supplement as necessary.  Melanotic stool, suspect upper GI bleed -Hemodynamically stable.  Suspect gastritis/esophagitis.  No endoscopic evaluation warranted.  GI, recommending PPI therapy.  Carafate added  History of HIV -Continue home medications  Generalized weakness -We will request for physical therapy evaluation  Nutrition Problem: Moderate Malnutrition Etiology: social / environmental circumstances      DVT prophylaxis: SCD Code Status: Full Family Communication: None Status is: Inpatient  Remains inpatient appropriate because:Unsafe d/c plan   Dispo: The patient is from: Home              Anticipated d/c is to: Home              Anticipated d/c date is: 1 day              Patient currently is not medically stable to d/c.    Consultants:  GI  Procedures:None  Antimicrobials:  Anti-infectives (From admission, onward)   Start     Dose/Rate Route Frequency Ordered Stop   05/05/20 0800  elvitegravir-cobicistat-emtricitabine-tenofovir (GENVOYA) 150-150-200-10 MG tablet 1 tablet     Discontinue     1 tablet Oral Daily with breakfast 05/05/20 0256     05/04/20 2315  ceFEPIme (MAXIPIME) 2 g in sodium chloride 0.9 % 100 mL IVPB        2 g 200 mL/hr over 30 Minutes Intravenous  Once 05/04/20 2300 05/05/20 0109   05/04/20 2315  metroNIDAZOLE (FLAGYL) IVPB 500 mg        500 mg 100 mL/hr over 60 Minutes Intravenous  Once 05/04/20 2300 05/05/20 0108   05/04/20 2315  vancomycin (VANCOCIN) IVPB 1000 mg/200 mL premix  Status:  Discontinued        1,000 mg 200 mL/hr over 60 Minutes Intravenous  Once 05/04/20 2300 05/04/20 2308   05/04/20 2315  vancomycin (VANCOREADY) IVPB 1500 mg/300 mL        1,500 mg 150 mL/hr over 120 Minutes Intravenous  Once 05/04/20 2308 05/05/20 0413      Subjective: Patient seen and examined at the bedside this afternoon.  Currently hemodynamically stable.  He still looks anxious, tremulous and feels weak.  He was wondering if he feels dizzy on standing.  Objective: Vitals:   05/08/20 0700 05/08/20 0800 05/08/20 0900 05/08/20 1200  BP: 102/64 96/61 109/65   Pulse: 72 65 83   Resp: (!) 22 20 12    Temp:  98.3 F (36.8 C)  97.9 F (36.6 C)  TempSrc:  Oral  Oral  SpO2: 96% 95% 95%   Weight:      Height:        Intake/Output Summary (Last 24 hours) at 05/08/2020 1424 Last data filed at 05/08/2020 1323 Gross per 24 hour  Intake 3567.13 ml  Output 3725 ml  Net -157.87 ml   Filed Weights   05/04/20 2339 05/05/20 0155  Weight: 73 kg 73 kg    Examination:  General exam: Not in distress,average built, anxious HEENT:PERRL,Oral mucosa moist, Ear/Nose normal on gross exam Respiratory system: Bilateral equal air entry, normal vesicular breath sounds, no wheezes or crackles  Cardiovascular system: S1 &  S2 heard, RRR. No JVD, murmurs, rubs, gallops or clicks. No pedal edema. Gastrointestinal system: Abdomen is nondistended, soft and nontender. No organomegaly or masses felt. Normal bowel sounds heard. Central nervous system: Alert and oriented. No focal neurological deficits. Extremities: No edema, no clubbing ,no cyanosis Skin: No rashes, lesions or ulcers,no icterus ,no pallor  Data Reviewed: I have personally reviewed following labs and imaging studies  CBC: Recent Labs  Lab 05/04/20 2241 05/05/20 0500 05/06/20 0343 05/07/20 0252 05/08/20 0125  WBC 6.6 4.6 3.3* 3.3* 3.0*  HGB 16.1 13.1 14.2 13.7 12.9*  HCT 48.0 39.6 40.7 39.9 38.4*  MCV 101.1* 101.5* 96.9 96.8 97.7  PLT 278 171 138* 136* 124*   Basic Metabolic Panel: Recent Labs  Lab 05/05/20 0550 05/05/20 1017 05/06/20 0025 05/07/20 0252 05/08/20 0125  NA 137 136 133* 134* 136  K 3.5 3.5 3.3* 3.4* 3.7  CL 105 105 98 100 104  CO2 20* 21* 22 29 26   GLUCOSE 91 103* 163* 125* 124*  BUN 11 11 <5* <5* 6  CREATININE 0.65 0.54* 0.59* 0.60* 0.54*  CALCIUM 7.5* 8.0* 8.2* 8.6* 8.8*  MG  --   --  1.5* 2.0 1.9   GFR: Estimated Creatinine Clearance: 135.6 mL/min (A) (by C-G formula based on SCr of 0.54 mg/dL (L)). Liver Function Tests: Recent Labs  Lab 05/04/20 2241 05/05/20 0500 05/06/20 0025 05/07/20 0252 05/08/20 0125  AST 197* 122* 117* 116* 132*  ALT 156* 110* 114* 122* 140*  ALKPHOS 60 44 47 56 46  BILITOT 1.1 1.0 1.7* 0.9 0.7  PROT 7.8 5.6* 5.8* 6.1* 5.9*  ALBUMIN 4.6 3.4* 3.5 3.6 3.2*   Recent Labs  Lab 05/04/20 2241  LIPASE 77*   No results for input(s): AMMONIA in the last 168 hours. Coagulation Profile: Recent Labs  Lab 05/04/20 2253  INR 1.0   Cardiac Enzymes: No results for input(s): CKTOTAL, CKMB, CKMBINDEX, TROPONINI in the last 168 hours. BNP (last 3 results) No results for input(s): PROBNP in the last 8760 hours. HbA1C: No results for input(s): HGBA1C in the last 72 hours. CBG: No  results for input(s): GLUCAP in the last 168 hours. Lipid Profile: No results for input(s): CHOL, HDL, LDLCALC, TRIG, CHOLHDL, LDLDIRECT in the last 72 hours. Thyroid Function Tests: No results for input(s): TSH, T4TOTAL, FREET4, T3FREE, THYROIDAB in the last 72 hours. Anemia Panel: No results for input(s): VITAMINB12, FOLATE, FERRITIN, TIBC, IRON, RETICCTPCT in the last 72 hours. Sepsis Labs: Recent Labs  Lab 05/04/20 2253 05/05/20 0223 05/05/20 1017 05/05/20 1102  LATICACIDVEN 6.5* 3.6* 2.0* 1.7    Recent Results (from the past 240 hour(s))  Blood culture (routine x 2)     Status: None (Preliminary result)   Collection Time: 05/04/20 10:53 PM   Specimen: BLOOD  Result Value Ref Range Status   Specimen Description   Final  BLOOD RIGHT ANTECUBITAL Performed at [facility], [name], [place], [place] [address]    Special Requests   Final    BOTTLES DRAWN AEROBIC AND ANAEROBIC Blood Culture results may not be optimal due to an excessive volume of blood received in culture bottles Performed at [facility], [address] [place], [place] [address]    Culture   Final    NO GROWTH 3 DAYS Performed at [facility], [address], [place], [place] [address]    Report Status PENDING  Incomplete  Blood culture (routine x 2)     Status: None (Preliminary result)   Collection Time: 05/04/20 10:58 PM   Specimen: BLOOD RIGHT HAND  Result Value Ref Range Status   Specimen Description   Final    BLOOD RIGHT HAND Performed at [facility], [address], [place], [place] [address]    Special Requests   Final    BOTTLES DRAWN AEROBIC AND ANAEROBIC Blood Culture adequate volume Performed at [facility], [address] [place], [place] [address]    Culture   Final    NO GROWTH 3 DAYS Performed at [facility], [address], [place], [place] [address]    Report Status PENDING   Incomplete  Urine culture     Status: None   Collection Time: 05/04/20 11:00 PM   Specimen: In/Out Cath Urine  Result Value Ref Range Status   Specimen Description   Final    IN/OUT CATH URINE Performed at [facility], [address], [place], [place] [address]    Special Requests   Final    NONE Performed at [facility], [address], [place], [place] [address]    Culture   Final    NO GROWTH Performed at [facility], [address], [place], [place] [address]    Report Status 05/06/2020 FINAL  Final  SARS Coronavirus 2 by RT PCR (hospital order, performed in [facility] hospital lab) Nasopharyngeal Nasopharyngeal Swab     Status: None   Collection Time: 05/04/20 11:00 PM   Specimen: Nasopharyngeal Swab  Result Value Ref Range Status   SARS Coronavirus 2 NEGATIVE NEGATIVE Final    Comment: (NOTE) SARS-CoV-2 target nucleic acids are NOT DETECTED.  The SARS-CoV-2 RNA is generally detectable in upper and lower respiratory specimens during the acute phase of infection. The lowest concentration of SARS-CoV-2 viral copies this assay can detect is 250 copies / mL. A negative result does not preclude SARS-CoV-2 infection and should not be used as the sole basis for treatment or other patient management decisions.  A negative result may occur with improper specimen collection / handling, submission of specimen other than nasopharyngeal swab, presence of viral mutation(s) within the areas targeted by this assay, and inadequate number of viral copies (<250 copies / mL). A negative result must be combined with clinical observations, patient history, and epidemiological information.  Fact Sheet for Patients:   BoilerBrush.com.cy  Fact Sheet for Healthcare Providers: https://pope.com/  This test is not yet approved or  cleared by the [place] FDA and has been authorized for detection  and/or diagnosis of SARS-CoV-2 by FDA under an Emergency Use Authorization (EUA).  This EUA will remain in effect (meaning this test can be used) for the duration of the COVID-19 declaration under Section 564(b)(1) of the Act, 21 U.S.C. section 360bbb-3(b)(1), unless the authorization is terminated or revoked  sooner.  Performed at [facility], [address] [place], [place] [address]          Radiology Studies: No results found.      Scheduled Meds: . Chlorhexidine Gluconate Cloth  6 each Topical Daily  . elvitegravir-cobicistat-emtricitabine-tenofovir  1 tablet Oral Q breakfast  . feeding supplement  1 Container Oral TID BM  . feeding supplement (ENSURE ENLIVE)  237 mL Oral TID BM  . folic acid  1 mg Oral Daily  . LORazepam  0-4 mg Intravenous Q12H   Or  . LORazepam  0-4 mg Oral Q12H  . mouth rinse  15 mL Mouth Rinse BID  . mupirocin ointment  1 application Nasal BID  . pantoprazole  40 mg Oral BID AC  . sucralfate  1 g Oral TID WC & HS  . thiamine  100 mg Oral Daily   Or  . thiamine  100 mg Intravenous Daily   Continuous Infusions:   LOS: 3 days    Time spent: 25 mins.More than 50% of that time was spent in counseling and/or coordination of care.      Burnadette Pop, MD [facility] P6/30/2021, 2:24 PM

## 2020-05-09 LAB — MAGNESIUM: Magnesium: 1.8 mg/dL (ref 1.7–2.4)

## 2020-05-09 LAB — CBC
HCT: 40 % (ref 39.0–52.0)
Hemoglobin: 13.2 g/dL (ref 13.0–17.0)
MCH: 32.8 pg (ref 26.0–34.0)
MCHC: 33 g/dL (ref 30.0–36.0)
MCV: 99.3 fL (ref 80.0–100.0)
Platelets: 122 10*3/uL — ABNORMAL LOW (ref 150–400)
RBC: 4.03 MIL/uL — ABNORMAL LOW (ref 4.22–5.81)
RDW: 14.2 % (ref 11.5–15.5)
WBC: 3.2 10*3/uL — ABNORMAL LOW (ref 4.0–10.5)
nRBC: 0 % (ref 0.0–0.2)

## 2020-05-09 MED ORDER — FOLIC ACID 1 MG PO TABS: 1.0000 mg | ORAL_TABLET | Freq: Every day | ORAL | 2 refills | Status: DC

## 2020-05-09 MED ORDER — THIAMINE HCL 100 MG PO TABS: 100.0000 mg | ORAL_TABLET | Freq: Every day | ORAL | 2 refills | Status: DC

## 2020-05-09 MED ORDER — NICOTINE 21 MG/24HR TD PT24: 21.0000 mg | MEDICATED_PATCH | Freq: Every day | TRANSDERMAL | 0 refills | Status: DC

## 2020-05-09 MED ORDER — SUCRALFATE 1 GM/10ML PO SUSP: 1.0000 g | Freq: Three times a day (TID) | ORAL | 0 refills | Status: DC

## 2020-05-09 MED ORDER — PANTOPRAZOLE SODIUM 40 MG PO TBEC: 40.0000 mg | DELAYED_RELEASE_TABLET | Freq: Every day | ORAL | 1 refills | Status: DC

## 2020-05-09 NOTE — Plan of Care (Signed)
Discharge instructions reviewed with patient, questions answered, verbalized understanding.  Patient given bus pass.  Also given Good Rx card to help with medications as per Cookie, Case manager.  Patient ambulatory to main entrance accompanied by RN.
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
## 2020-05-10 LAB — CULTURE, BLOOD (ROUTINE X 2)
Culture: NO GROWTH
Culture: NO GROWTH
Special Requests: ADEQUATE

## 2020-05-15 ENCOUNTER — Emergency Department (HOSPITAL_COMMUNITY)
Admission: EM | Admit: 2020-05-15 | Discharge: 2020-05-16 | Disposition: A | Discharge status: Home / Self Care | Payer: Self-pay | Attending: Emergency Medicine | Admitting: Emergency Medicine

## 2020-05-15 ENCOUNTER — Encounter (HOSPITAL_COMMUNITY): Payer: Self-pay | Admitting: Emergency Medicine

## 2020-05-15 DIAGNOSIS — Y908 Blood alcohol level of 240 mg/100 ml or more: Secondary | ICD-10-CM | POA: Insufficient documentation

## 2020-05-15 DIAGNOSIS — R1010 Upper abdominal pain, unspecified: Secondary | ICD-10-CM | POA: Insufficient documentation

## 2020-05-15 DIAGNOSIS — M7918 Myalgia, other site: Secondary | ICD-10-CM | POA: Insufficient documentation

## 2020-05-15 DIAGNOSIS — Z87891 Personal history of nicotine dependence: Secondary | ICD-10-CM | POA: Insufficient documentation

## 2020-05-15 DIAGNOSIS — B20 Human immunodeficiency virus [HIV] disease: Secondary | ICD-10-CM | POA: Insufficient documentation

## 2020-05-15 DIAGNOSIS — F1010 Alcohol abuse, uncomplicated: Secondary | ICD-10-CM | POA: Insufficient documentation

## 2020-05-15 DIAGNOSIS — R112 Nausea with vomiting, unspecified: Secondary | ICD-10-CM | POA: Insufficient documentation

## 2020-05-15 DIAGNOSIS — Z765 Malingerer [conscious simulation]: Secondary | ICD-10-CM | POA: Insufficient documentation

## 2020-05-15 DIAGNOSIS — Z79899 Other long term (current) drug therapy: Secondary | ICD-10-CM | POA: Insufficient documentation

## 2020-05-15 LAB — COMPREHENSIVE METABOLIC PANEL
ALT: 121 U/L — ABNORMAL HIGH (ref 0–44)
AST: 45 U/L — ABNORMAL HIGH (ref 15–41)
Albumin: 4.6 g/dL (ref 3.5–5.0)
Alkaline Phosphatase: 60 U/L (ref 38–126)
Anion gap: 13 (ref 5–15)
BUN: 8 mg/dL (ref 6–20)
CO2: 23 mmol/L (ref 22–32)
Calcium: 8.8 mg/dL — ABNORMAL LOW (ref 8.9–10.3)
Chloride: 103 mmol/L (ref 98–111)
Creatinine, Ser: 0.76 mg/dL (ref 0.61–1.24)
GFR calc Af Amer: >60 mL/min (ref >60)
GFR calc non Af Amer: >60 mL/min (ref >60)
Glucose, Bld: 110 mg/dL — ABNORMAL HIGH (ref 70–99)
Potassium: 3.5 mmol/L (ref 3.5–5.1)
Sodium: 139 mmol/L (ref 135–145)
Total Bilirubin: 0.7 mg/dL (ref 0.3–1.2)
Total Protein: 7.9 g/dL (ref 6.5–8.1)

## 2020-05-15 LAB — URINALYSIS, ROUTINE W REFLEX MICROSCOPIC
Bilirubin Urine: NEGATIVE
Glucose, UA: NEGATIVE mg/dL
Hgb urine dipstick: NEGATIVE
Ketones, ur: NEGATIVE mg/dL
Leukocytes,Ua: NEGATIVE
Nitrite: NEGATIVE
Protein, ur: NEGATIVE mg/dL
Specific Gravity, Urine: 1.003 — ABNORMAL LOW (ref 1.005–1.030)
pH: 6 (ref 5.0–8.0)

## 2020-05-15 LAB — CBC
HCT: 39.9 % (ref 39.0–52.0)
Hemoglobin: 13.6 g/dL (ref 13.0–17.0)
MCH: 33.1 pg (ref 26.0–34.0)
MCHC: 34.1 g/dL (ref 30.0–36.0)
MCV: 97.1 fL (ref 80.0–100.0)
Platelets: 343 10*3/uL (ref 150–400)
RBC: 4.11 MIL/uL — ABNORMAL LOW (ref 4.22–5.81)
RDW: 14.5 % (ref 11.5–15.5)
WBC: 4.1 10*3/uL (ref 4.0–10.5)
nRBC: 0 % (ref 0.0–0.2)

## 2020-05-15 LAB — ETHANOL: Alcohol, Ethyl (B): 326 mg/dL (ref <10)

## 2020-05-15 LAB — LIPASE, BLOOD: Lipase: 114 U/L — ABNORMAL HIGH (ref 11–51)

## 2020-05-15 MED ORDER — SODIUM CHLORIDE 0.9 % IV SOLN
1000.0000 mL | INTRAVENOUS | Status: DC
  Administered 2020-05-15: 1000 mL via INTRAVENOUS

## 2020-05-15 MED ORDER — ONDANSETRON HCL 4 MG/2ML IJ SOLN
4.0000 mg | Freq: Once | INTRAMUSCULAR | Status: AC
  Administered 2020-05-15: 4 mg via INTRAVENOUS
  Filled 2020-05-15: qty 2

## 2020-05-15 MED ORDER — SODIUM CHLORIDE 0.9% FLUSH
3.0000 mL | Freq: Once | INTRAVENOUS | Status: AC
  Administered 2020-05-15: 3 mL via INTRAVENOUS

## 2020-05-15 MED ORDER — SODIUM CHLORIDE 0.9 % IV BOLUS (SEPSIS)
1000.0000 mL | Freq: Once | INTRAVENOUS | Status: AC
  Administered 2020-05-15: 1000 mL via INTRAVENOUS

## 2020-05-15 MED ORDER — LORAZEPAM 2 MG/ML IJ SOLN
1.0000 mg | Freq: Once | INTRAMUSCULAR | Status: AC
  Administered 2020-05-15: 1 mg via INTRAVENOUS
  Filled 2020-05-15: qty 1

## 2020-05-15 NOTE — ED Provider Notes (Signed)
[facility]-EMERGENCY DEPT Provider Note   CSN: [id number] Arrival date & time: 05/15/20  1725     History Chief Complaint  Patient presents with  . Withdrawal    Gary [name] is a 34 y.o. male.  HPI   Patient presents to the ED for evaluation of alcohol withdrawal.  Patient states he has history of alcohol abuse.  He drinks 4-5 40s per day or 1/5 of vodka.  Patient last drank this morning.  Patient states he started having trouble with nausea and vomiting today.  He has been having upper abdominal pain and generalized body aches.  He feels that he is withdrawing.  Patient states he has gone to detox before.  The last was a month or 2 ago.  Medical records indicate the patient was admitted to hospital on June 26 to July 01 for alcoholic ketoacidosis.  Patient was also at [facility] yesterday and according to the records he was discharged from there this morning.  Past Medical History:  Diagnosis Date  . Alcohol abuse   . HIV (human immunodeficiency virus infection) (HCC)   . Subdural hematoma [facility])     Patient Active Problem List   Diagnosis Date Noted  . Malnutrition of moderate degree 05/07/2020  . Gastrointestinal hemorrhage   . Alcoholic hepatitis without ascites   . Melena 05/05/2020  . Fever 05/05/2020  . Alcoholic ketoacidosis 05/05/2020  . Alcohol withdrawal (HCC) 04/17/2020  . Thrombocytopenia (HCC) 07/25/2019  . Transaminitis 07/25/2019  . Diarrhea 07/25/2019  . Traumatic subdural hematoma (HCC) 07/02/2019  . Alcohol abuse with alcohol-induced mood disorder (HCC) 05/13/2019  . Alcohol dependence (HCC) 04/16/2019  . 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 HIV disease (HCC) 06/16/2018  . Polysubstance abuse (HCC) 06/16/2018  . Cigarette smoker 06/16/2018    Past Surgical History:  Procedure Laterality Date  . INCISION AND DRAINAGE PERIRECTAL ABSCESS N/A 09/24/2016   Procedure:  IRRIGATION AND DEBRIDEMENT PERIRECTAL ABSCESS;  Surgeon: Ricarda Frame, MD;  Location: [facility] ORS;  Service: General;  Laterality: N/A;  . none         Family History  Problem Relation Age of Onset  . Alcohol abuse Mother   . Alcohol abuse Father     Social History   Tobacco Use  . Smoking status: Former Smoker    Packs/day: 0.50    Types: Cigarettes  . Smokeless tobacco: Never Used  . Tobacco comment: unable to smoke while incarcerated 6+ months 02/13/20  Vaping Use  . Vaping Use: Never used  Substance Use Topics  . Alcohol use: Not Currently    Alcohol/week: 3.0 standard drinks    Types: 3 Cans of beer per week    Comment: 24 oz  . Drug use: Not Currently    Types: Marijuana, Methamphetamines    Comment: last used 04/10/2019    Home Medications Prior to Admission medications   Medication Sig Start Date End Date Taking? Authorizing Provider  chlordiazePOXIDE (LIBRIUM) 25 MG capsule 50mg  PO TID x 1D, then 25-50mg  PO BID X 1D, then 25-50mg  PO QD X 1D 05/16/20   07/17/20, MD  folic acid (FOLVITE) 1 MG tablet Take 1 tablet (1 mg total) by mouth daily. 05/09/20   07/10/20, MD  GENVOYA 150-150-200-10 MG TABS tablet TAKE 1 TABLET BY MOUTH DAILY WITH BREAKFAST Patient taking differently: Take 1 tablet by mouth daily.  04/05/20   04/07/20, MD  nicotine (NICODERM CQ - DOSED IN  MG/24 HOURS) 21 mg/24hr patch Place 1 patch (21 mg total) onto the skin daily. 05/09/20   Burnadette Pop, MD  pantoprazole (PROTONIX) 40 MG tablet Take 1 tablet (40 mg total) by mouth daily. 05/09/20   Burnadette Pop, MD  sucralfate (CARAFATE) 1 GM/10ML suspension Take 10 mLs (1 g total) by mouth 4 (four) times daily -  with meals and at bedtime for 14 days. 05/09/20 05/23/20  Burnadette Pop, MD  thiamine 100 MG tablet Take 1 tablet (100 mg total) by mouth daily. 05/09/20   Burnadette Pop, MD  famotidine (PEPCID) 20 MG tablet Take 1 tablet (20 mg total) by mouth 2 (two) times daily. Patient not taking:  Reported on 06/01/2019 05/14/19 06/02/19  Benjiman Core, MD    Allergies    Caffeine  Review of Systems   Review of Systems  All other systems reviewed and are negative.   Physical Exam Updated Vital Signs BP 102/74   Pulse 67   Temp 98.2 F (36.8 C) (Oral)   Resp 17   SpO2 98%   Physical Exam Vitals and nursing note reviewed.  Constitutional:      General: He is not in acute distress.    Appearance: He is well-developed.  HENT:     Head: Normocephalic and atraumatic.     Right Ear: External ear normal.     Left Ear: External ear normal.  Eyes:     General: No scleral icterus.       Right eye: No discharge.        Left eye: No discharge.     Conjunctiva/sclera: Conjunctivae normal.  Neck:     Trachea: No tracheal deviation.  Cardiovascular:     Rate and Rhythm: Normal rate and regular rhythm.  Pulmonary:     Effort: Pulmonary effort is normal. No respiratory distress.     Breath sounds: Normal breath sounds. No stridor. No wheezing or rales.  Abdominal:     General: Bowel sounds are normal. There is no distension.     Palpations: Abdomen is soft.     Tenderness: There is abdominal tenderness. There is no guarding or rebound.     Comments: Mild epigastric  Musculoskeletal:        General: No tenderness.     Cervical back: Neck supple.  Skin:    General: Skin is warm and dry.     Findings: No rash.  Neurological:     Mental Status: He is alert.     Cranial Nerves: No cranial nerve deficit (no facial droop, extraocular movements intact, no slurred speech).     Sensory: No sensory deficit.     Motor: No abnormal muscle tone or seizure activity.     Coordination: Coordination normal.     Comments: No tremor      ED Results / Procedures / Treatments   Labs (all labs ordered are listed, but only abnormal results are displayed) Labs Reviewed  LIPASE, BLOOD - Abnormal; Notable for the following components:      Result Value   Lipase 114 (*)    All other  components within normal limits  COMPREHENSIVE METABOLIC PANEL - Abnormal; Notable for the following components:   Glucose, Bld 110 (*)    Calcium 8.8 (*)    AST 45 (*)    ALT 121 (*)    All other components within normal limits  CBC - Abnormal; Notable for the following components:   RBC 4.11 (*)    All other components  within normal limits  URINALYSIS, ROUTINE W REFLEX MICROSCOPIC - Abnormal; Notable for the following components:   Color, Urine STRAW (*)    Specific Gravity, Urine 1.003 (*)    All other components within normal limits  ETHANOL - Abnormal; Notable for the following components:   Alcohol, Ethyl (B) 326 (*)    All other components within normal limits    EKG None  Radiology No results found.  Procedures Procedures (including critical care time)  Medications Ordered in ED Medications  sodium chloride 0.9 % bolus 1,000 mL (0 mLs Intravenous Stopped 05/15/20 2055)    Followed by  0.9 %  sodium chloride infusion (1,000 mLs Intravenous New Bag/Given 05/15/20 1912)  sodium chloride flush (NS) 0.9 % injection 3 mL (3 mLs Intravenous Given 05/15/20 1911)  ondansetron (ZOFRAN) injection 4 mg (4 mg Intravenous Given 05/15/20 1921)  LORazepam (ATIVAN) injection 1 mg (1 mg Intravenous Given 05/15/20 1922)    ED Course  I have reviewed the triage vital signs and the nursing notes.  Pertinent labs & imaging results that were available during my care of the patient were reviewed by me and considered in my medical decision making (see chart for details).  Clinical Course as of May 16 48  Wed May 15, 2020  1939 Labs reviewed.  No significant electrolyte abnormalities.  Lipase elevated at 114.  Likely related to his acute alcohol use.  ETOH elevated at 326   [JK]    Clinical Course User Index [JK] Linwood Dibbles, MD   MDM Rules/Calculators/A&P                          Patient presented to ED for evaluation of possible alcohol withdrawal.  Patient's laboratory tests were  notable for an elevated lipase although the patient is not having abdominal pain.  His alcohol level is also severely elevated at 326.  Patient presentation was more consistent with alcohol abuse and intoxication rather than withdrawal.  Patient did not have any tachycardia or hypertension.  He did not have any tremors to suggest withdrawal.  I reviewed the medical records and the patient was actually held overnight at a [facility] medical facility for the same issue yesterday and was just released this morning.  Patient was not having any alcohol withdrawal that time.  I explained the patient that his symptoms are more related to his alcohol abuse and not actually withdrawal.  I will give him a prescription for Librium to help with any withdrawal as he tries to quit.  I will give him a referral for outpatient resources. Final Clinical Impression(s) / ED Diagnoses Final diagnoses:  Alcohol abuse    Rx / DC Orders ED Discharge Orders         Ordered    chlordiazePOXIDE (LIBRIUM) 25 MG capsule     Discontinue  Reprint     05/16/20 0048           Linwood Dibbles, MD 05/16/20 [phone]

## 2020-05-15 NOTE — ED Notes (Addendum)
When pt goes to sleep his O2 sat is in the 80s.  This writer had to keep waking pt up so his O2 sats would go into the 90s.

## 2020-05-15 NOTE — ED Notes (Signed)
I called patient name in the lobby and outside to be triage and no one responded 

## 2020-05-15 NOTE — ED Notes (Signed)
Date and time results received: 05/15/20 7:37 PM  Test: ETOH Critical Value: 326  Name of Provider Notified: Lynelle Doctor, EDP

## 2020-05-15 NOTE — ED Triage Notes (Signed)
Pt c/o ETOH withdrawal that started toady. Last drink was earlier today. Average daily use is either 4-5 40oz or 5th liquor. Having vomiting and generalized body pains.

## 2020-05-16 MED ORDER — CHLORDIAZEPOXIDE HCL 25 MG PO CAPS
ORAL_CAPSULE | ORAL | 0 refills | Status: DC
Start: 2020-05-16 — End: 2020-05-18

## 2020-05-16 NOTE — Discharge Instructions (Signed)
Contact the outpatient treatment centers listed to help you with your alcohol use problem

## 2020-05-18 ENCOUNTER — Emergency Department (HOSPITAL_COMMUNITY): Payer: Self-pay

## 2020-05-18 ENCOUNTER — Other Ambulatory Visit: Payer: Self-pay

## 2020-05-18 ENCOUNTER — Emergency Department (HOSPITAL_COMMUNITY)
Admission: EM | Admit: 2020-05-18 | Discharge: 2020-05-18 | Disposition: A | Discharge status: Home / Self Care | Payer: Self-pay | Attending: Emergency Medicine | Admitting: Emergency Medicine

## 2020-05-18 ENCOUNTER — Encounter (HOSPITAL_COMMUNITY): Payer: Self-pay

## 2020-05-18 DIAGNOSIS — Y908 Blood alcohol level of 240 mg/100 ml or more: Secondary | ICD-10-CM | POA: Insufficient documentation

## 2020-05-18 DIAGNOSIS — Y999 Unspecified external cause status: Secondary | ICD-10-CM | POA: Insufficient documentation

## 2020-05-18 DIAGNOSIS — W19XXXA Unspecified fall, initial encounter: Secondary | ICD-10-CM | POA: Insufficient documentation

## 2020-05-18 DIAGNOSIS — Y929 Unspecified place or not applicable: Secondary | ICD-10-CM | POA: Insufficient documentation

## 2020-05-18 DIAGNOSIS — Z87891 Personal history of nicotine dependence: Secondary | ICD-10-CM | POA: Insufficient documentation

## 2020-05-18 DIAGNOSIS — Y939 Activity, unspecified: Secondary | ICD-10-CM | POA: Insufficient documentation

## 2020-05-18 DIAGNOSIS — F10920 Alcohol use, unspecified with intoxication, uncomplicated: Secondary | ICD-10-CM | POA: Insufficient documentation

## 2020-05-18 DIAGNOSIS — Z79899 Other long term (current) drug therapy: Secondary | ICD-10-CM | POA: Insufficient documentation

## 2020-05-18 DIAGNOSIS — S8391XA Sprain of unspecified site of right knee, initial encounter: Secondary | ICD-10-CM | POA: Insufficient documentation

## 2020-05-18 LAB — CBC WITH DIFFERENTIAL/PLATELET
Abs Immature Granulocytes: 0.02 10*3/uL (ref 0.00–0.07)
Basophils Absolute: 0.1 10*3/uL (ref 0.0–0.1)
Basophils Relative: 1 %
Eosinophils Absolute: 0.1 10*3/uL (ref 0.0–0.5)
Eosinophils Relative: 1 %
HCT: 44.5 % (ref 39.0–52.0)
Hemoglobin: 15.2 g/dL (ref 13.0–17.0)
Immature Granulocytes: 0 %
Lymphocytes Relative: 21 %
Lymphs Abs: 1.8 10*3/uL (ref 0.7–4.0)
MCH: 33.6 pg (ref 26.0–34.0)
MCHC: 34.2 g/dL (ref 30.0–36.0)
MCV: 98.5 fL (ref 80.0–100.0)
Monocytes Absolute: 0.5 10*3/uL (ref 0.1–1.0)
Monocytes Relative: 5 %
Neutro Abs: 6.2 10*3/uL (ref 1.7–7.7)
Neutrophils Relative %: 72 %
Platelets: 369 10*3/uL (ref 150–400)
RBC: 4.52 MIL/uL (ref 4.22–5.81)
RDW: 15.2 % (ref 11.5–15.5)
WBC: 8.6 10*3/uL (ref 4.0–10.5)
nRBC: 0 % (ref 0.0–0.2)

## 2020-05-18 LAB — COMPREHENSIVE METABOLIC PANEL
ALT: 92 U/L — ABNORMAL HIGH (ref 0–44)
AST: 55 U/L — ABNORMAL HIGH (ref 15–41)
Albumin: 3.8 g/dL (ref 3.5–5.0)
Alkaline Phosphatase: 58 U/L (ref 38–126)
Anion gap: 14 (ref 5–15)
BUN: <5 mg/dL — ABNORMAL LOW (ref 6–20)
CO2: 22 mmol/L (ref 22–32)
Calcium: 8.5 mg/dL — ABNORMAL LOW (ref 8.9–10.3)
Chloride: 106 mmol/L (ref 98–111)
Creatinine, Ser: 0.74 mg/dL (ref 0.61–1.24)
GFR calc Af Amer: >60 mL/min (ref >60)
GFR calc non Af Amer: >60 mL/min (ref >60)
Glucose, Bld: 83 mg/dL (ref 70–99)
Potassium: 4 mmol/L (ref 3.5–5.1)
Sodium: 142 mmol/L (ref 135–145)
Total Bilirubin: 0.6 mg/dL (ref 0.3–1.2)
Total Protein: 6.7 g/dL (ref 6.5–8.1)

## 2020-05-18 LAB — ETHANOL: Alcohol, Ethyl (B): 244 mg/dL — ABNORMAL HIGH (ref <10)

## 2020-05-18 IMAGING — DX DG KNEE COMPLETE 4+V*R*
4 series · 4 of 4 positions shown · non-contrast
Comparison: None.

CLINICAL DATA: Pain following fall

EXAM:
RIGHT KNEE - COMPLETE 4+ VIEW

[knee ap]
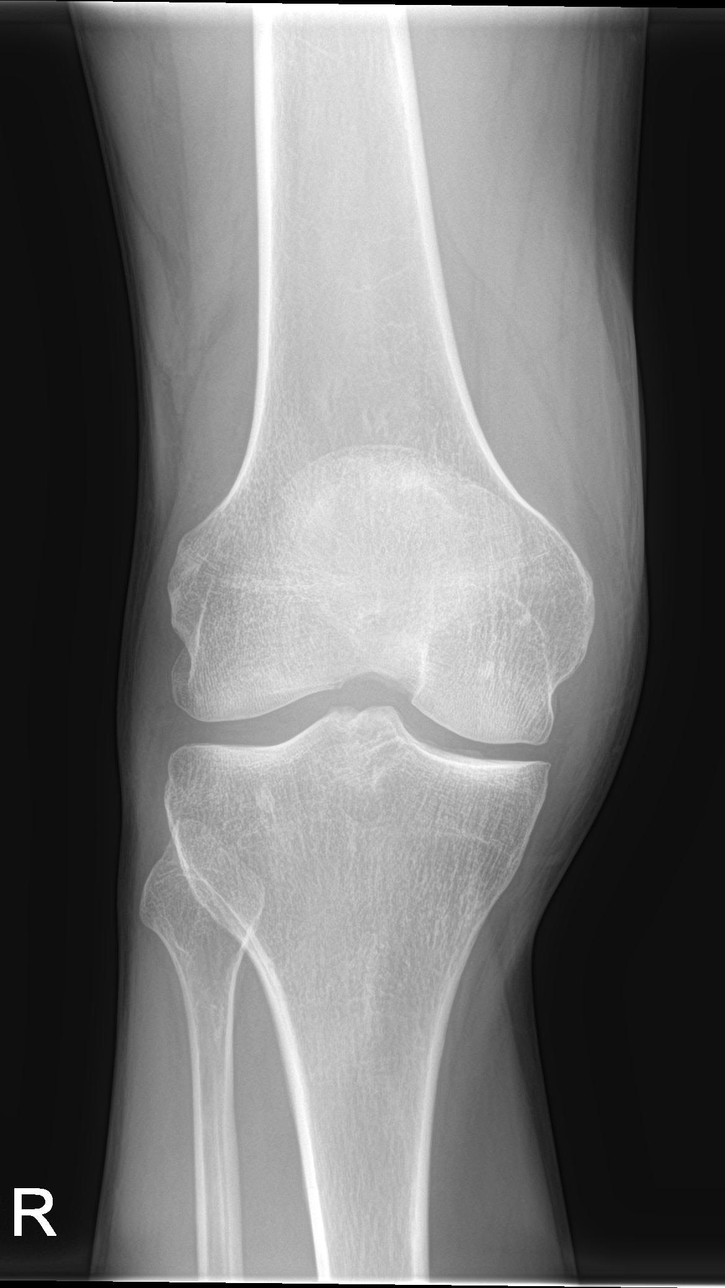

[knee lat]
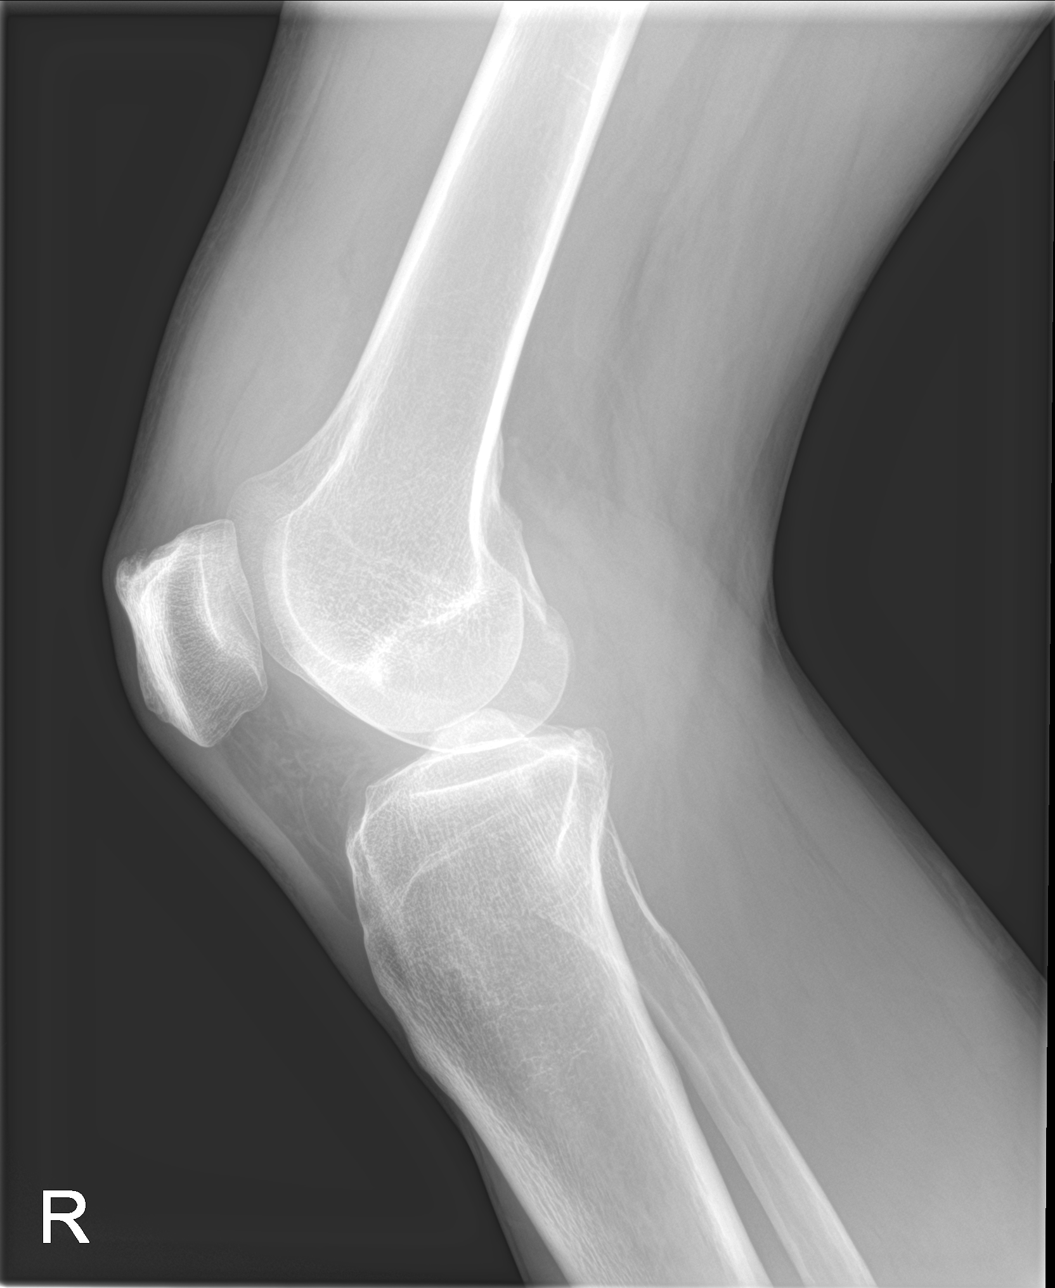

[knee obl (1 of 2)]
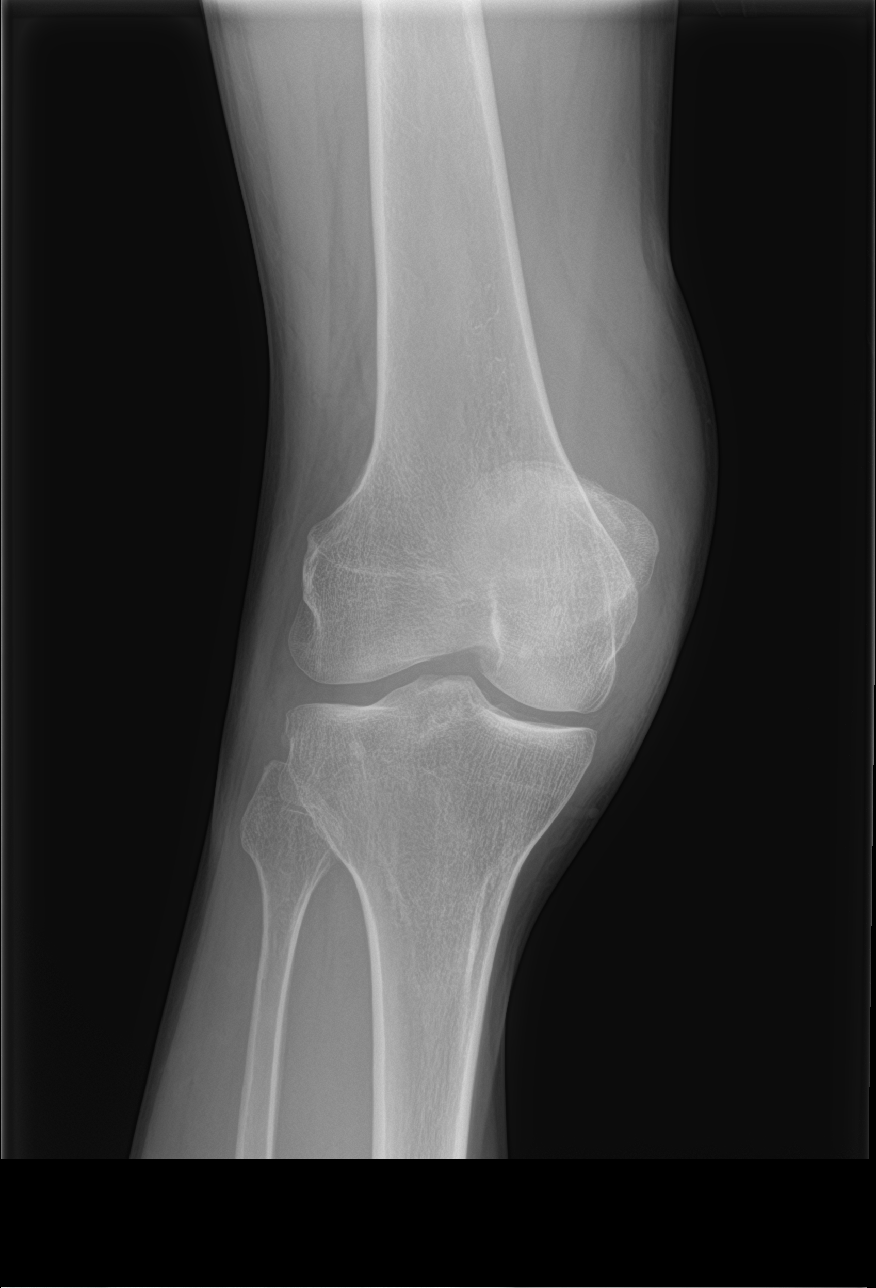

[knee obl (2 of 2)]
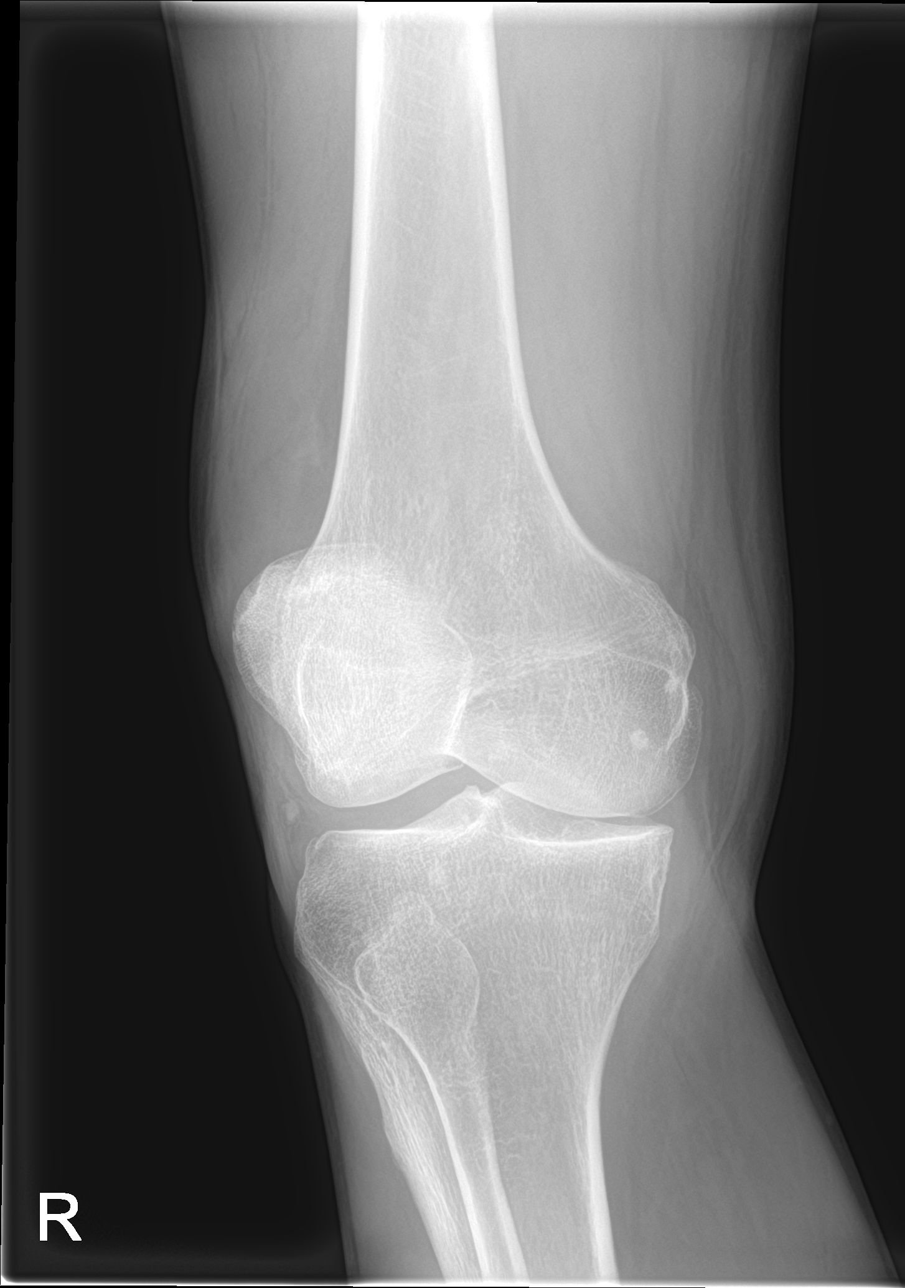

[4 of 4 positions shown; findings below may reference images not displayed]

FINDINGS: Frontal, lateral, and bilateral oblique views were obtained. There
is no appreciable fracture or dislocation. There is a moderate joint
effusion. There is mild narrowing medially in the patellofemoral
joint region. There is no erosive change. There is mild spurring
along the anterior superior patella.
IMPRESSION: Moderate joint effusion. No fracture or dislocation. Mild narrowing
of the medial compartment and patellofemoral joints. Mild spurring
along the anterior superior patella likely represents distal
quadriceps tendinosis.

## 2020-05-18 MED ORDER — CHLORDIAZEPOXIDE HCL 25 MG PO CAPS
ORAL_CAPSULE | ORAL | 0 refills | Status: DC
Start: 2020-05-18 — End: 2020-05-29

## 2020-05-18 MED ORDER — THIAMINE HCL 100 MG/ML IJ SOLN
100.0000 mg | Freq: Once | INTRAMUSCULAR | Status: AC
  Administered 2020-05-18: 100 mg via INTRAVENOUS
  Filled 2020-05-18: qty 2

## 2020-05-18 MED ORDER — GENVOYA 150-150-200-10 MG PO TABS: 1.0000 | ORAL_TABLET | Freq: Every day | ORAL | 1 refills | Status: DC

## 2020-05-18 MED ORDER — SODIUM CHLORIDE 0.9 % IV BOLUS
1000.0000 mL | Freq: Once | INTRAVENOUS | Status: AC
  Administered 2020-05-18: 1000 mL via INTRAVENOUS

## 2020-05-18 MED ORDER — LORAZEPAM 2 MG/ML IJ SOLN
1.0000 mg | Freq: Once | INTRAMUSCULAR | Status: AC
  Administered 2020-05-18: 1 mg via INTRAVENOUS
  Filled 2020-05-18: qty 1

## 2020-05-18 NOTE — ED Provider Notes (Signed)
[facility] EMERGENCY DEPARTMENT Provider Note   CSN: [id number] Arrival date & time: 05/18/20  1235     History Chief Complaint  Patient presents with  . fall/etoh withdrawal    Gary [name] is a 34 y.o. male.  HPI   Patient presents ED for evaluation of recurrent alcohol issues and right knee pain.  Patient states he fell last evening.  He twisted his right knee and also struck his head.  He denies any trouble with headache or neck pain right now.  He primarily is having pain in his right knee.  Patient states is very hard to straighten it.  He does have a history of alcohol abuse.  Patient continues to unfortunately drink heavily.  I last saw him 3 days ago at [facility] long ED for alcohol intoxication.  Patient states he last drank something last evening.  Patient states he does feel shaky.  He feels like he is withdrawing.  Past Medical History:  Diagnosis Date  . Alcohol abuse   . HIV (human immunodeficiency virus infection) (HCC)   . Subdural hematoma [facility])     Patient Active Problem List   Diagnosis Date Noted  . Malnutrition of moderate degree 05/07/2020  . Gastrointestinal hemorrhage   . Alcoholic hepatitis without ascites   . Melena 05/05/2020  . Fever 05/05/2020  . Alcoholic ketoacidosis 05/05/2020  . Alcohol withdrawal (HCC) 04/17/2020  . Thrombocytopenia (HCC) 07/25/2019  . Transaminitis 07/25/2019  . Diarrhea 07/25/2019  . Traumatic subdural hematoma (HCC) 07/02/2019  . Alcohol abuse with alcohol-induced mood disorder (HCC) 05/13/2019  . Alcohol dependence (HCC) 04/16/2019  . 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 HIV disease (HCC) 06/16/2018  . Polysubstance abuse (HCC) 06/16/2018  . Cigarette smoker 06/16/2018    Past Surgical History:  Procedure Laterality Date  . INCISION AND DRAINAGE PERIRECTAL ABSCESS N/A 09/24/2016   Procedure: IRRIGATION AND DEBRIDEMENT PERIRECTAL ABSCESS;   Surgeon: Ricarda Frame, MD;  Location: [facility] ORS;  Service: General;  Laterality: N/A;  . none         Family History  Problem Relation Age of Onset  . Alcohol abuse Mother   . Alcohol abuse Father     Social History   Tobacco Use  . Smoking status: Former Smoker    Packs/day: 0.50    Types: Cigarettes  . Smokeless tobacco: Never Used  . Tobacco comment: unable to smoke while incarcerated 6+ months 02/13/20  Vaping Use  . Vaping Use: Never used  Substance Use Topics  . Alcohol use: Not Currently    Alcohol/week: 3.0 standard drinks    Types: 3 Cans of beer per week    Comment: 24 oz  . Drug use: Not Currently    Types: Marijuana, Methamphetamines    Comment: last used 04/10/2019    Home Medications Prior to Admission medications   Medication Sig Start Date End Date Taking? Authorizing Provider  chlordiazePOXIDE (LIBRIUM) 25 MG capsule 50mg  PO TID x 1D, then 25-50mg  PO BID X 1D, then 25-50mg  PO QD X 1D 05/18/20   07/19/20, MD  folic acid (FOLVITE) 1 MG tablet Take 1 tablet (1 mg total) by mouth daily. 05/09/20   07/10/20, MD  GENVOYA 150-150-200-10 MG TABS tablet TAKE 1 TABLET BY MOUTH DAILY WITH BREAKFAST Patient taking differently: Take 1 tablet by mouth daily.  04/05/20   04/07/20, MD  nicotine (NICODERM CQ - DOSED IN MG/24 HOURS) 21 mg/24hr patch Place 1  patch (21 mg total) onto the skin daily. 05/09/20   Burnadette Pop, MD  pantoprazole (PROTONIX) 40 MG tablet Take 1 tablet (40 mg total) by mouth daily. 05/09/20   Burnadette Pop, MD  sucralfate (CARAFATE) 1 GM/10ML suspension Take 10 mLs (1 g total) by mouth 4 (four) times daily -  with meals and at bedtime for 14 days. 05/09/20 05/23/20  Burnadette Pop, MD  thiamine 100 MG tablet Take 1 tablet (100 mg total) by mouth daily. 05/09/20   Burnadette Pop, MD  famotidine (PEPCID) 20 MG tablet Take 1 tablet (20 mg total) by mouth 2 (two) times daily. Patient not taking: Reported on 06/01/2019 05/14/19 06/02/19  Benjiman Core, MD    Allergies    Caffeine  Review of Systems   Review of Systems  All other systems reviewed and are negative.   Physical Exam Updated Vital Signs BP 116/76   Pulse (!) 102   Temp 98.2 F (36.8 C) (Oral)   Resp 16   Ht 1.829 m (6')   Wt 68 kg   SpO2 98%   BMI 20.34 kg/m   Physical Exam Vitals and nursing note reviewed.  Constitutional:      Appearance: He is well-developed. He is ill-appearing. He is not toxic-appearing.  HENT:     Head: Normocephalic and atraumatic.     Comments: Well approximated laceration forehead, no significant hematoma no active bleeding    Right Ear: External ear normal.     Left Ear: External ear normal.  Eyes:     General: No scleral icterus.       Right eye: No discharge.        Left eye: No discharge.     Conjunctiva/sclera: Conjunctivae normal.  Neck:     Trachea: No tracheal deviation.  Cardiovascular:     Rate and Rhythm: Normal rate and regular rhythm.  Pulmonary:     Effort: Pulmonary effort is normal. No respiratory distress.     Breath sounds: Normal breath sounds. No stridor. No wheezing or rales.  Abdominal:     General: Bowel sounds are normal. There is no distension.     Palpations: Abdomen is soft.     Tenderness: There is no abdominal tenderness. There is no guarding or rebound.  Musculoskeletal:     Cervical back: Normal and neck supple.     Thoracic back: Normal.     Lumbar back: Normal.     Right knee: Tenderness present.  Skin:    General: Skin is warm and dry.     Findings: No rash.  Neurological:     Mental Status: He is alert.     Cranial Nerves: No cranial nerve deficit (no facial droop, extraocular movements intact, no slurred speech).     Sensory: No sensory deficit.     Motor: Tremor present. No weakness, abnormal muscle tone or seizure activity.     Coordination: Coordination normal.     ED Results / Procedures / Treatments   Labs (all labs ordered are listed, but only abnormal results  are displayed) Labs Reviewed  COMPREHENSIVE METABOLIC PANEL - Abnormal; Notable for the following components:      Result Value   BUN <5 (*)    Calcium 8.5 (*)    AST 55 (*)    ALT 92 (*)    All other components within normal limits  ETHANOL - Abnormal; Notable for the following components:   Alcohol, Ethyl (B) 244 (*)    All other  components within normal limits  CBC WITH DIFFERENTIAL/PLATELET    EKG None  Radiology DG Knee Complete 4 Views Right  Result Date: 05/18/2020 CLINICAL DATA:  Pain following fall EXAM: RIGHT KNEE - COMPLETE 4+ VIEW COMPARISON:  None. FINDINGS: Frontal, lateral, and bilateral oblique views were obtained. There is no appreciable fracture or dislocation. There is a moderate joint effusion. There is mild narrowing medially in the patellofemoral joint region. There is no erosive change. There is mild spurring along the anterior superior patella. IMPRESSION: Moderate joint effusion. No fracture or dislocation. Mild narrowing of the medial compartment and patellofemoral joints. Mild spurring along the anterior superior patella likely represents distal quadriceps tendinosis. Electronically Signed   By: Bretta Bang III M.D.   On: 05/18/2020 15:27    Procedures Procedures (including critical care time)  Medications Ordered in ED Medications  thiamine (B-1) injection 100 mg (100 mg Intravenous Given 05/18/20 1326)  sodium chloride 0.9 % bolus 1,000 mL (1,000 mLs Intravenous Bolus from Bag 05/18/20 1334)  LORazepam (ATIVAN) injection 1 mg (1 mg Intravenous Given 05/18/20 1327)    ED Course  I have reviewed the triage vital signs and the nursing notes.  Pertinent labs & imaging results that were available during my care of the patient were reviewed by me and considered in my medical decision making (see chart for details).  Clinical Course as of May 19 1607  Sat May 18, 2020  1453 Labs reviewed.  CBC is normal.  Metabolic panel is unremarkable.  Alcohol  level is unfortunately again elevated   [JK]    Clinical Course User Index [JK] Linwood Dibbles, MD   MDM Rules/Calculators/A&P                          Patient presented to the ED with recurrent alcohol intoxication and a fall.  Patient's alcohol level is significantly elevated.  Fortunately is alert and awake and appears clinically sober despite his elevated alcohol level.  I have stressed the importance of trying to quit drinking with the patient.  I will provide him Librium.  He does not require inpatient treatment for DTs or severe withdrawal at this time.  X-ray does not show any signs of fracture or dislocation.  Patient does have an effusion likely ligamentous knee injury.  I recommend outpatient follow-up with orthopedics.  I have placed a peer support consult order to see if we can provide some assistance to try to help the patient with his alcohol abuse. Final Clinical Impression(s) / ED Diagnoses Final diagnoses:  Sprain of right knee, unspecified ligament, initial encounter  Alcoholic intoxication without complication (HCC)    Rx / DC Orders ED Discharge Orders         Ordered    chlordiazePOXIDE (LIBRIUM) 25 MG capsule     Discontinue  Reprint     05/18/20 1608           Linwood Dibbles, MD 05/18/20 1610

## 2020-05-18 NOTE — Progress Notes (Signed)
Orthopedic Tech Progress Note Patient Details:  Gary [name] October 12, 1986 [id number] The MD said I could give the patient the CRUTCHES but the OFFICERS/[employer] said patient could not have them because he was going to jail once he left here and the CRUTCHES would not be able to go into the JAIL. Ortho Devices Type of Ortho Device: Knee Immobilizer Ortho Device/Splint Location: RLE Ortho Device/Splint Interventions: Ordered, Application, Adjustment   Post Interventions Patient Tolerated: Well Instructions Provided: Care of device   Donald Pore 05/18/2020, 4:38 PM

## 2020-05-18 NOTE — Discharge Instructions (Signed)
Follow-up with an orthopedic doctor for further evaluation of your knee injury.  Use the crutches and knee immobilizer.  Try to cut down on your alcohol consumption.

## 2020-05-18 NOTE — ED Triage Notes (Signed)
Patient arrived by [facility] for knee pain and small laceration to face following fall last night. Patient with reported heavy ETOH last night. Awake and alert on arrival. Patient with tremors on arrival, heavy etoh use daily-

## 2020-05-19 ENCOUNTER — Emergency Department (HOSPITAL_COMMUNITY)
Admission: EM | Admit: 2020-05-19 | Discharge: 2020-05-20 | Disposition: A | Discharge status: Home / Self Care | Payer: Self-pay | Attending: Emergency Medicine | Admitting: Emergency Medicine

## 2020-05-19 ENCOUNTER — Encounter (HOSPITAL_COMMUNITY): Payer: Self-pay

## 2020-05-19 ENCOUNTER — Other Ambulatory Visit: Payer: Self-pay

## 2020-05-19 DIAGNOSIS — F10921 Alcohol use, unspecified with intoxication delirium: Secondary | ICD-10-CM | POA: Diagnosis present

## 2020-05-19 DIAGNOSIS — F1014 Alcohol abuse with alcohol-induced mood disorder: Secondary | ICD-10-CM

## 2020-05-19 DIAGNOSIS — Z79899 Other long term (current) drug therapy: Secondary | ICD-10-CM | POA: Insufficient documentation

## 2020-05-19 DIAGNOSIS — W19XXXD Unspecified fall, subsequent encounter: Secondary | ICD-10-CM | POA: Insufficient documentation

## 2020-05-19 DIAGNOSIS — Z21 Asymptomatic human immunodeficiency virus [HIV] infection status: Secondary | ICD-10-CM | POA: Diagnosis present

## 2020-05-19 DIAGNOSIS — F1020 Alcohol dependence, uncomplicated: Secondary | ICD-10-CM | POA: Diagnosis present

## 2020-05-19 DIAGNOSIS — F10239 Alcohol dependence with withdrawal, unspecified: Secondary | ICD-10-CM

## 2020-05-19 DIAGNOSIS — Y908 Blood alcohol level of 240 mg/100 ml or more: Secondary | ICD-10-CM | POA: Insufficient documentation

## 2020-05-19 DIAGNOSIS — Y999 Unspecified external cause status: Secondary | ICD-10-CM | POA: Insufficient documentation

## 2020-05-19 DIAGNOSIS — B20 Human immunodeficiency virus [HIV] disease: Secondary | ICD-10-CM | POA: Diagnosis present

## 2020-05-19 DIAGNOSIS — F1010 Alcohol abuse, uncomplicated: Secondary | ICD-10-CM | POA: Insufficient documentation

## 2020-05-19 DIAGNOSIS — F10129 Alcohol abuse with intoxication, unspecified: Secondary | ICD-10-CM

## 2020-05-19 DIAGNOSIS — F10229 Alcohol dependence with intoxication, unspecified: Secondary | ICD-10-CM | POA: Diagnosis present

## 2020-05-19 DIAGNOSIS — Y929 Unspecified place or not applicable: Secondary | ICD-10-CM | POA: Insufficient documentation

## 2020-05-19 DIAGNOSIS — F1094 Alcohol use, unspecified with alcohol-induced mood disorder: Secondary | ICD-10-CM | POA: Diagnosis present

## 2020-05-19 DIAGNOSIS — F10920 Alcohol use, unspecified with intoxication, uncomplicated: Secondary | ICD-10-CM | POA: Diagnosis present

## 2020-05-19 DIAGNOSIS — M25561 Pain in right knee: Secondary | ICD-10-CM | POA: Insufficient documentation

## 2020-05-19 DIAGNOSIS — Z87891 Personal history of nicotine dependence: Secondary | ICD-10-CM | POA: Insufficient documentation

## 2020-05-19 DIAGNOSIS — F10929 Alcohol use, unspecified with intoxication, unspecified: Secondary | ICD-10-CM | POA: Diagnosis present

## 2020-05-19 DIAGNOSIS — F10939 Alcohol use, unspecified with withdrawal, unspecified: Secondary | ICD-10-CM

## 2020-05-19 DIAGNOSIS — Y939 Activity, unspecified: Secondary | ICD-10-CM | POA: Insufficient documentation

## 2020-05-19 LAB — CBC WITH DIFFERENTIAL/PLATELET
Abs Immature Granulocytes: 0.01 10*3/uL (ref 0.00–0.07)
Basophils Absolute: 0.1 10*3/uL (ref 0.0–0.1)
Basophils Relative: 1 %
Eosinophils Absolute: 0.1 10*3/uL (ref 0.0–0.5)
Eosinophils Relative: 2 %
HCT: 43.5 % (ref 39.0–52.0)
Hemoglobin: 14.8 g/dL (ref 13.0–17.0)
Immature Granulocytes: 0 %
Lymphocytes Relative: 25 %
Lymphs Abs: 1.9 10*3/uL (ref 0.7–4.0)
MCH: 33.5 pg (ref 26.0–34.0)
MCHC: 34 g/dL (ref 30.0–36.0)
MCV: 98.4 fL (ref 80.0–100.0)
Monocytes Absolute: 0.4 10*3/uL (ref 0.1–1.0)
Monocytes Relative: 5 %
Neutro Abs: 5 10*3/uL (ref 1.7–7.7)
Neutrophils Relative %: 67 %
Platelets: 339 10*3/uL (ref 150–400)
RBC: 4.42 MIL/uL (ref 4.22–5.81)
RDW: 14.8 % (ref 11.5–15.5)
WBC: 7.5 10*3/uL (ref 4.0–10.5)
nRBC: 0 % (ref 0.0–0.2)

## 2020-05-19 LAB — COMPREHENSIVE METABOLIC PANEL
ALT: 75 U/L — ABNORMAL HIGH (ref 0–44)
AST: 41 U/L (ref 15–41)
Albumin: 4.3 g/dL (ref 3.5–5.0)
Alkaline Phosphatase: 56 U/L (ref 38–126)
Anion gap: 15 (ref 5–15)
BUN: 5 mg/dL — ABNORMAL LOW (ref 6–20)
CO2: 24 mmol/L (ref 22–32)
Calcium: 8.7 mg/dL — ABNORMAL LOW (ref 8.9–10.3)
Chloride: 103 mmol/L (ref 98–111)
Creatinine, Ser: 0.71 mg/dL (ref 0.61–1.24)
GFR calc Af Amer: >60 mL/min (ref >60)
GFR calc non Af Amer: >60 mL/min (ref >60)
Glucose, Bld: 81 mg/dL (ref 70–99)
Potassium: 3.5 mmol/L (ref 3.5–5.1)
Sodium: 142 mmol/L (ref 135–145)
Total Bilirubin: 0.8 mg/dL (ref 0.3–1.2)
Total Protein: 7.7 g/dL (ref 6.5–8.1)

## 2020-05-19 LAB — ETHANOL: Alcohol, Ethyl (B): 388 mg/dL (ref <10)

## 2020-05-19 LAB — MAGNESIUM: Magnesium: 2.4 mg/dL (ref 1.7–2.4)

## 2020-05-19 LAB — ACETAMINOPHEN LEVEL: Acetaminophen (Tylenol), Serum: <10 ug/mL — ABNORMAL LOW (ref 10–30)

## 2020-05-19 LAB — LIPASE, BLOOD: Lipase: 81 U/L — ABNORMAL HIGH (ref 11–51)

## 2020-05-19 LAB — SALICYLATE LEVEL: Salicylate Lvl: <7 mg/dL — ABNORMAL LOW (ref 7.0–30.0)

## 2020-05-19 MED ORDER — LORAZEPAM 2 MG/ML IJ SOLN
1.0000 mg | Freq: Once | INTRAMUSCULAR | Status: AC
  Administered 2020-05-19: 1 mg via INTRAVENOUS
  Filled 2020-05-19: qty 1

## 2020-05-19 MED ORDER — FOLIC ACID 1 MG PO TABS
1.0000 mg | ORAL_TABLET | Freq: Once | ORAL | Status: AC
  Administered 2020-05-19: 1 mg via ORAL
  Filled 2020-05-19: qty 1

## 2020-05-19 MED ORDER — THIAMINE HCL 100 MG/ML IJ SOLN
Freq: Once | INTRAVENOUS | Status: AC
  Filled 2020-05-19: qty 1000

## 2020-05-19 MED ORDER — THIAMINE HCL 100 MG/ML IJ SOLN
100.0000 mg | Freq: Once | INTRAMUSCULAR | Status: AC
  Administered 2020-05-19: 100 mg via INTRAVENOUS
  Filled 2020-05-19: qty 2

## 2020-05-19 MED ORDER — SODIUM CHLORIDE 0.9 % IV BOLUS
1000.0000 mL | Freq: Once | INTRAVENOUS | Status: AC
  Administered 2020-05-19: 1000 mL via INTRAVENOUS

## 2020-05-19 NOTE — Consult Note (Addendum)
Medical Consultation   Gary [name]  ZOX:[id number]  DOB: [birth year]  DOA: 05/19/2020  PCP: Patient, No Pcp Per    Requesting physician: Harlene Salts PA-C  Reason for consultation: Admission for Alcohol withdrawal   History of Present Illness: Gary [name] is an 34 y.o. male with h/o HIV, EtOH abuse, homelessness, EtOH hepatitis.  Pt presents to ED for EtOH intoxication and knee pain.  Pt was at [facility] yesterday for knee pain following a fall.  X ray was neg at that time.  Pt has BAL of 388 on presentation.  EDP concerned about withdrawal, requested medical admit for "Alcohol withdrawal".   Review of Systems:  ROS As per HPI otherwise 10 point review of systems negative.     Past Medical History: Past Medical History:  Diagnosis Date  . Alcohol abuse   . HIV (human immunodeficiency virus infection) (HCC)   . Subdural hematoma [facility])     Past Surgical History: Past Surgical History:  Procedure Laterality Date  . INCISION AND DRAINAGE PERIRECTAL ABSCESS N/A 09/24/2016   Procedure: IRRIGATION AND DEBRIDEMENT PERIRECTAL ABSCESS;  Surgeon: Ricarda Frame, MD;  Location: [facility] ORS;  Service: General;  Laterality: N/A;  . none       Allergies:   Allergies  Allergen Reactions  . Caffeine Palpitations     Social History:  reports that he has quit smoking. His smoking use included cigarettes. He smoked 0.50 packs per day. He has never used smokeless tobacco. He reports previous alcohol use of about 3.0 standard drinks of alcohol per week. He reports previous drug use. Drugs: Marijuana and Methamphetamines.   Family History: Family History  Problem Relation Age of Onset  . Alcohol abuse Mother   . Alcohol abuse Father        Physical Exam: Vitals:   05/19/20 1632 05/19/20 1842 05/19/20 1859 05/19/20 1924  BP: 117/79  (!) 125/92 (!) 125/92  Pulse: 82 79 79 79  Resp: 20  20   Temp:      TempSrc:      SpO2: 99%  96%      Constitutional:  Alert and awake, oriented x3, not in any acute distress. Eyes: PERLA, EOMI, irises appear normal, anicteric sclera,  ENMT: external ears and nose appear normal            Lips appears normal, oropharynx mucosa, tongue, posterior pharynx appear normal  Neck: neck appears normal, no masses, normal ROM, no thyromegaly, no JVD  CVS: S1-S2 clear, no murmur rubs or gallops, no LE edema, normal pedal pulses  Respiratory:  clear to auscultation bilaterally, no wheezing, rales or rhonchi. Respiratory effort normal. No accessory muscle use.  Abdomen: soft nontender, nondistended, normal bowel sounds, no hepatosplenomegaly, no hernias  Musculoskeletal: : no cyanosis, clubbing or edema noted bilaterally  Neuro: Cranial nerves II-XII intact, strength, sensation, reflexes. No tremor Psych: Flat affect, no hallucinations Skin: no rashes or lesions or ulcers, no induration or nodules    Data reviewed:  I have personally reviewed following labs and imaging studies Labs:  CBC: Recent Labs  Lab 05/15/20 1850 05/18/20 1350 05/19/20 1411  WBC 4.1 8.6 7.5  NEUTROABS  --  6.2 5.0  HGB 13.6 15.2 14.8  HCT 39.9 44.5 43.5  MCV 97.1 98.5 98.4  PLT 343 369 339    Basic Metabolic Panel: Recent Labs  Lab 05/15/20 1850 05/15/20 1850 05/18/20 1350 05/19/20 1411  NA 139  --  142 142  K 3.5   < > 4.0 3.5  CL 103  --  106 103  CO2 23  --  22 24  GLUCOSE 110*  --  83 81  BUN 8  --  <5* 5*  CREATININE 0.76  --  0.74 0.71  CALCIUM 8.8*  --  8.5* 8.7*   < > = values in this interval not displayed.   GFR Estimated Creatinine Clearance: 126.3 mL/min (by C-G formula based on SCr of 0.71 mg/dL). Liver Function Tests: Recent Labs  Lab 05/15/20 1850 05/18/20 1350 05/19/20 1411  AST 45* 55* 41  ALT 121* 92* 75*  ALKPHOS 60 58 56  BILITOT 0.7 0.6 0.8  PROT 7.9 6.7 7.7  ALBUMIN 4.6 3.8 4.3   Recent Labs  Lab 05/15/20 1850 05/19/20 1411  LIPASE 114* 81*   No results for  input(s): AMMONIA in the last 168 hours. Coagulation profile No results for input(s): INR, PROTIME in the last 168 hours.  Cardiac Enzymes: No results for input(s): CKTOTAL, CKMB, CKMBINDEX, TROPONINI in the last 168 hours. BNP: Invalid input(s): POCBNP CBG: No results for input(s): GLUCAP in the last 168 hours. D-Dimer No results for input(s): DDIMER in the last 72 hours. Hgb A1c No results for input(s): HGBA1C in the last 72 hours. Lipid Profile No results for input(s): CHOL, HDL, LDLCALC, TRIG, CHOLHDL, LDLDIRECT in the last 72 hours. Thyroid function studies No results for input(s): TSH, T4TOTAL, T3FREE, THYROIDAB in the last 72 hours.  Invalid input(s): FREET3 Anemia work up No results for input(s): VITAMINB12, FOLATE, FERRITIN, TIBC, IRON, RETICCTPCT in the last 72 hours. Urinalysis    Component Value Date/Time   COLORURINE STRAW (A) 05/15/2020 1824   APPEARANCEUR CLEAR 05/15/2020 1824   APPEARANCEUR Clear 11/15/2014 1755   LABSPEC 1.003 (L) 05/15/2020 1824   LABSPEC 1.002 11/15/2014 1755   PHURINE 6.0 05/15/2020 1824   GLUCOSEU NEGATIVE 05/15/2020 1824   GLUCOSEU Negative 11/15/2014 1755   HGBUR NEGATIVE 05/15/2020 1824   BILIRUBINUR NEGATIVE 05/15/2020 1824   BILIRUBINUR Negative 11/15/2014 1755   KETONESUR NEGATIVE 05/15/2020 1824   PROTEINUR NEGATIVE 05/15/2020 1824   NITRITE NEGATIVE 05/15/2020 1824   LEUKOCYTESUR NEGATIVE 05/15/2020 1824   LEUKOCYTESUR Negative 11/15/2014 1755     Microbiology No results found for this or any previous visit (from the past 240 hour(s)).     Inpatient Medications:   Scheduled Meds: Continuous Infusions: . banana bag IV 1000 mL       Radiological Exams on Admission: DG Knee Complete 4 Views Right  Result Date: 05/18/2020 CLINICAL DATA:  Pain following fall EXAM: RIGHT KNEE - COMPLETE 4+ VIEW COMPARISON:  None. FINDINGS: Frontal, lateral, and bilateral oblique views were obtained. There is no appreciable fracture or  dislocation. There is a moderate joint effusion. There is mild narrowing medially in the patellofemoral joint region. There is no erosive change. There is mild spurring along the anterior superior patella. IMPRESSION: Moderate joint effusion. No fracture or dislocation. Mild narrowing of the medial compartment and patellofemoral joints. Mild spurring along the anterior superior patella likely represents distal quadriceps tendinosis. Electronically Signed   By: Bretta Bang III M.D.   On: 05/18/2020 15:27    Impression/Recommendations Principal Problem:   Alcohol abuse with intoxication (HCC) Active Problems:   HIV disease (HCC)   Alcohol dependence (HCC)   Alcohol abuse with alcohol-induced mood disorder (HCC)   1. EtOH intoxication - 1. Patient has BAL of 388 as  of 6h ago, looks like he usually runs in the 200s-300s. 2. Clinically this patient is not in delirium tremens at this time: 1. No tachycardia 2. Flat affect, mild tremor 3. High BAL currently with last drink earlier today 3. If he continues to not drink for a prolonged period may end up in DTs and may end up requiring admission, but dont feel that "admission for alcohol withdrawal" to medical bed is needed at this time. 1. If withdrawal in future is a concern and patient wishes to stop drinking, consider referral to detox from ED. 4. Have ordered Mg to be checked 5. Have ordered banana bag as long as he is here in the ED anyhow. 2. HIV - 1. Pt hasnt filled HAART meds it seems according to pharmacy notes. 3. Further work up for other conditions as per EDP     Saliah Crisp M. D.O. [facility] 05/19/2020, 7:54 PM

## 2020-05-19 NOTE — ED Provider Notes (Signed)
Care handoff received from [facility] PA-C at shift change please see her note for full details.  In short 34 year old male history of alcohol abuse, HIV, polysubstance abuse, alcoholic hepatitis presents today for alcohol intoxication and really pain.  He was seen yesterday at [facility] for knee pain after a fall, x-ray showed effusion but no fracture or dislocation.  Patient drank 2 bottles of wine today.  No new injuries today.  Screening labs were obtained.  Plan of care is to follow-up on screening labs and monitor patient.  Physical Exam  BP 117/79 (BP Location: Left Arm)   Pulse 79   Temp 98.8 F (37.1 C) (Oral)   Resp 20   SpO2 99%   Physical Exam Constitutional:      General: He is not in acute distress.    Appearance: He is well-developed. He is not ill-appearing or toxic-appearing.     Comments: Disheveled  HENT:     Head: Normocephalic and atraumatic.     Jaw: There is normal jaw occlusion.  Eyes:     General: Vision grossly intact. Gaze aligned appropriately.  Neck:     Trachea: Trachea and phonation normal.  Pulmonary:     Effort: Pulmonary effort is normal. No accessory muscle usage or respiratory distress.     Breath sounds: Normal air entry.  Abdominal:     Palpations: Abdomen is soft.     Tenderness: There is no abdominal tenderness.  Musculoskeletal:     Cervical back: Normal range of motion and neck supple.  Neurological:     Mental Status: He is lethargic.     Comments: Patient lethargic sleeping heavily.  He will wake up to shaking.  He tells me his name is Gary [name], he requests to sleep longer.  Psychiatric:        Behavior: Behavior is cooperative.    ED Course/Procedures   Clinical Course as of May 19 1857  Wynelle Link May 19, 2020  1639 Called lab and said they will process labs soon.    [CA]    Clinical Course User Index [CA] Mannie Stabile, PA-C    Procedures  MDM  CBC within normal limits, no leukocytosis to suggest infection and no  evidence of anemia Lipase 81, improved from 4 days ago CMP shows no emergent electrolyte derangement, evidence of acute kidney injury, emergent elevation of LFTs or gap Ethanol 388 - On my examination patient appears heavily intoxicated, he is lethargic will wake up to touch, states his neighbor denies pain and request to sleep longer.  Patient will need to be monitored in the ED until he is more clinically sober.  Plan of care discussed with Dr. Jeraldine Loots who agrees. - Patient reevaluated at 7:15 PM, he is anxious, tremulous and tearful.  Appears to be in alcohol withdrawal.  I discussed with patient his alcohol level and he reports that he drinks a "ridiculous amount of alcohol".  Concerned that despite patient's high alcohol level which was drawn at 2:11 PM patient might now be experiencing acute alcohol withdrawal.  I have ordered Ativan and asked nurses to perform CIWA. - CIWA was 14, patient was given Ativan.  Consult was placed to hospitalist service for admission.  Patient seen and evaluated by Dr. Julian Reil, advises no indication for admission at this time.  Will continue to monitor patient here in the ED. ----------- Care handoff given to Dr. Jeraldine Loots at shift change. Disposition per oncoming team.  Note: Portions of this report may have been  transcribed using voice recognition software. Every effort was made to ensure accuracy; however, inadvertent computerized transcription errors may still be present.   Elizabeth Palau 05/19/20 1947    Gerhard Munch, MD 05/20/20 [phone]

## 2020-05-19 NOTE — ED Notes (Signed)
Date and time results received: 05/19/20 5:30 PM  (use smartphrase ".now" to insert current time)  Test: ETOH Critical Value: 388  Name of Provider Notified: Apolinar Junes PA  Orders Received? Or Actions Taken?: Orders Received - See Orders for details

## 2020-05-19 NOTE — ED Notes (Signed)
Pt ambulate to restroom with little assistance; a bit unsteady.

## 2020-05-19 NOTE — Discharge Instructions (Addendum)
As discussed, all of your labs are reassuring today.  I have included the number of the orthopedic surgeon.  Please call to schedule an appointment for further evaluation of your right knee pain.  Continue to ice and elevate your right knee.  You may take over-the-counter ibuprofen or Tylenol as needed for pain.  Return to the ER for new or worsening symptoms.

## 2020-05-19 NOTE — ED Provider Notes (Signed)
[facility]-EMERGENCY DEPT Provider Note   CSN: [id number] Arrival date & time: 05/19/20  1333     History No chief complaint on file.   Gary [name] is a 34 y.o. male with a past medical history significant for alcohol abuse, HIV, polysubstance abuse, and history of alcoholic hepatitis who presents to the ED due to possible alcohol intoxication and severe right knee pain.  Patient was evaluated at [facility] ED yesterday after a fall where an x-ray was performed of his right knee which demonstrated moderate joint effusion without any bony fractures.  Patient was placed in a knee immobilizer with crutches.  Patient admits to continued pain, worse with movement.  Denies numbness and tingling of right lower extremity.  Patient also states he is concerned about possible alcohol withdrawal.  He admits to drinking 2 "small" bottles of wine earlier this morning.  He admits to drinking 4-5 40s or 1/5 vodka daily.  Denies tremors, hallucinations, diaphoresis, nausea, vomiting. Denies abdominal pain. Denies SI, HI, visual/auditory hallucinations.   History obtained from patient and past medical records. No interpreter used during encounter.      Past Medical History:  Diagnosis Date  . Alcohol abuse   . HIV (human immunodeficiency virus infection) (HCC)   . Subdural hematoma [facility])     Patient Active Problem List   Diagnosis Date Noted  . Malnutrition of moderate degree 05/07/2020  . Gastrointestinal hemorrhage   . Alcoholic hepatitis without ascites   . Melena 05/05/2020  . Fever 05/05/2020  . Alcoholic ketoacidosis 05/05/2020  . Alcohol withdrawal (HCC) 04/17/2020  . Thrombocytopenia (HCC) 07/25/2019  . Transaminitis 07/25/2019  . Diarrhea 07/25/2019  . Traumatic subdural hematoma (HCC) 07/02/2019  . Alcohol abuse with alcohol-induced mood disorder (HCC) 05/13/2019  . Alcohol dependence (HCC) 04/16/2019  . 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 HIV disease (HCC) 06/16/2018  . Polysubstance abuse (HCC) 06/16/2018  . Cigarette smoker 06/16/2018    Past Surgical History:  Procedure Laterality Date  . INCISION AND DRAINAGE PERIRECTAL ABSCESS N/A 09/24/2016   Procedure: IRRIGATION AND DEBRIDEMENT PERIRECTAL ABSCESS;  Surgeon: Ricarda Frame, MD;  Location: [facility] ORS;  Service: General;  Laterality: N/A;  . none         Family History  Problem Relation Age of Onset  . Alcohol abuse Mother   . Alcohol abuse Father     Social History   Tobacco Use  . Smoking status: Former Smoker    Packs/day: 0.50    Types: Cigarettes  . Smokeless tobacco: Never Used  . Tobacco comment: unable to smoke while incarcerated 6+ months 02/13/20  Vaping Use  . Vaping Use: Never used  Substance Use Topics  . Alcohol use: Not Currently    Alcohol/week: 3.0 standard drinks    Types: 3 Cans of beer per week    Comment: 24 oz  . Drug use: Not Currently    Types: Marijuana, Methamphetamines    Comment: last used 04/10/2019    Home Medications Prior to Admission medications   Medication Sig Start Date End Date Taking? Authorizing Provider  chlordiazePOXIDE (LIBRIUM) 25 MG capsule 50mg  PO TID x 1D, then 25-50mg  PO BID X 1D, then 25-50mg  PO QD X 1D Patient taking differently: Take 25-50 mg by mouth See admin instructions. Take 50 mg by mouth three times a day on day one, 25-50 mg twice a day on day two, then 25-50 mg once a day on day three- AS  DIRECTED 05/18/20   Linwood Dibbles, MD  elvitegravir-cobicistat-emtricitabine-tenofovir (GENVOYA) 150-150-200-10 MG TABS tablet Take 1 tablet by mouth daily with breakfast. 05/18/20   Linwood Dibbles, MD  folic acid (FOLVITE) 1 MG tablet Take 1 tablet (1 mg total) by mouth daily. 05/09/20   Burnadette Pop, MD  nicotine (NICODERM CQ - DOSED IN MG/24 HOURS) 21 mg/24hr patch Place 1 patch (21 mg total) onto the skin daily. 05/09/20   Burnadette Pop, MD  pantoprazole (PROTONIX) 40 MG tablet Take 1  tablet (40 mg total) by mouth daily. 05/09/20   Burnadette Pop, MD  sucralfate (CARAFATE) 1 GM/10ML suspension Take 10 mLs (1 g total) by mouth 4 (four) times daily -  with meals and at bedtime for 14 days. 05/09/20 05/23/20  Burnadette Pop, MD  thiamine 100 MG tablet Take 1 tablet (100 mg total) by mouth daily. 05/09/20   Burnadette Pop, MD  famotidine (PEPCID) 20 MG tablet Take 1 tablet (20 mg total) by mouth 2 (two) times daily. Patient not taking: Reported on 06/01/2019 05/14/19 06/02/19  Benjiman Core, MD    Allergies    Caffeine  Review of Systems   Review of Systems  Constitutional: Negative for chills, diaphoresis and fever.  Gastrointestinal: Negative for abdominal pain, diarrhea, nausea and vomiting.  Musculoskeletal: Positive for arthralgias.  Neurological: Negative for tremors.  All other systems reviewed and are negative.   Physical Exam Updated Vital Signs BP 117/79 (BP Location: Left Arm)   Pulse 82   Temp 98.8 F (37.1 C) (Oral)   Resp 20   SpO2 99%   Physical Exam Vitals and nursing note reviewed.  Constitutional:      General: He is not in acute distress.    Appearance: He is not toxic-appearing.  HENT:     Head: Normocephalic.  Eyes:     Pupils: Pupils are equal, round, and reactive to light.  Cardiovascular:     Rate and Rhythm: Normal rate and regular rhythm.     Pulses: Normal pulses.     Heart sounds: Normal heart sounds. No murmur heard.  No friction rub. No gallop.   Pulmonary:     Effort: Pulmonary effort is normal.     Breath sounds: Normal breath sounds.  Abdominal:     General: Abdomen is flat. Bowel sounds are normal. There is no distension.     Palpations: Abdomen is soft.     Tenderness: There is abdominal tenderness. There is no guarding or rebound.     Comments: Epigastric tenderness.   Musculoskeletal:     Cervical back: Neck supple.     Comments: Tenderness throughout anterior aspect of right knee, most significant on medial aspect.  Mild edema. Limited ROM due to pain. RLE neurovascularly intact with soft compartments.   Skin:    General: Skin is warm and dry.  Neurological:     General: No focal deficit present.     Mental Status: He is alert.     Comments: No tremor on exam.   Psychiatric:        Mood and Affect: Mood normal.        Behavior: Behavior normal.     ED Results / Procedures / Treatments   Labs (all labs ordered are listed, but only abnormal results are displayed) Labs Reviewed  COMPREHENSIVE METABOLIC PANEL - Abnormal; Notable for the following components:      Result Value   BUN 5 (*)    Calcium 8.7 (*)    ALT 75 (*)  All other components within normal limits  LIPASE, BLOOD - Abnormal; Notable for the following components:   Lipase 81 (*)    All other components within normal limits  CBC WITH DIFFERENTIAL/PLATELET  ETHANOL    EKG None  Radiology DG Knee Complete 4 Views Right  Result Date: 05/18/2020 CLINICAL DATA:  Pain following fall EXAM: RIGHT KNEE - COMPLETE 4+ VIEW COMPARISON:  None. FINDINGS: Frontal, lateral, and bilateral oblique views were obtained. There is no appreciable fracture or dislocation. There is a moderate joint effusion. There is mild narrowing medially in the patellofemoral joint region. There is no erosive change. There is mild spurring along the anterior superior patella. IMPRESSION: Moderate joint effusion. No fracture or dislocation. Mild narrowing of the medial compartment and patellofemoral joints. Mild spurring along the anterior superior patella likely represents distal quadriceps tendinosis. Electronically Signed   By: Bretta Bang III M.D.   On: 05/18/2020 15:27    Procedures Procedures (including critical care time)  Medications Ordered in ED Medications  sodium chloride 0.9 % bolus 1,000 mL (0 mLs Intravenous Stopped 05/19/20 1649)    ED Course  I have reviewed the triage vital signs and the nursing notes.  Pertinent labs & imaging results  that were available during my care of the patient were reviewed by me and considered in my medical decision making (see chart for details).  Clinical Course as of May 19 1729  Wynelle Link May 19, 2020  1639 Called lab and said they will process labs soon.    [CA]    Clinical Course User Index [CA] Mannie Stabile, PA-C   MDM Rules/Calculators/A&P                         34 year old male presents to the ED due to possible alcohol intoxication and right knee pain.  Patient was evaluated in [facility] ED yesterday after a fall where an x-ray of his right knee was performed which was negative for any bony fractures.  He admits to drinking 2 small bottles of wine prior to arrival.  No SI, HI, or hallucinations. Upon arrival, patient is afebrile, not tachycardic or hypoxic.  Patient nontoxic-appearing.  Physical exam significant for epigastric tenderness.  Tenderness throughout anterior aspect of right knee specifically on the medial aspect.  Right lower extremity neurovascularly intact.  Knee immobilizer in place.  No further x-rays warranted at this time.  Suspect possible ligament injury.  Patient will need orthopedic follow-up.  Will obtain routine labs and ethanol level given patient's epigastric abdominal tenderness.  IV fluids given.  Patient is not displaying any clinical signs of withdrawal right now.  We will continue to monitor.  CBC unremarkable no leukocytosis and normal hemoglobin.  CMP significant for mild elevation in ALT at 75 which is an improvement from baseline.  Lipase mildly elevated to 81 which is an improvement from 4 days ago. Low suspicion for pancreatitis.  Patient handed off to [facility], PA-C at shift change pending po challenge. If patient can eat and ambulate, he may be discharged home.  Final Clinical Impression(s) / ED Diagnoses Final diagnoses:  Alcohol abuse  Acute pain of right knee    Rx / DC Orders ED Discharge Orders    None       Jesusita Oka 05/19/20 1731    Raeford Razor, MD 05/26/20 1128

## 2020-05-19 NOTE — ED Triage Notes (Signed)
Patient presented to ed with c/o   ETOH intoxication.Patient is homeless and was found on street. [facility] seen [facility] yesterday for a [facility].

## 2020-05-20 MED ORDER — THIAMINE HCL 100 MG/ML IJ SOLN: 100.0000 mg | Freq: Every day | INTRAMUSCULAR | Status: DC

## 2020-05-20 MED ORDER — LORAZEPAM 2 MG/ML IJ SOLN
2.0000 mg | Freq: Once | INTRAMUSCULAR | Status: AC
  Administered 2020-05-20: 2 mg via INTRAVENOUS
  Filled 2020-05-20: qty 1

## 2020-05-20 MED ORDER — THIAMINE HCL 100 MG PO TABS
100.0000 mg | ORAL_TABLET | Freq: Every day | ORAL | Status: DC
  Administered 2020-05-20: 100 mg via ORAL
  Filled 2020-05-20: qty 1

## 2020-05-20 MED ORDER — LORAZEPAM 2 MG/ML IJ SOLN
0.0000 mg | Freq: Four times a day (QID) | INTRAMUSCULAR | Status: DC
  Administered 2020-05-20 (×2): 2 mg via INTRAVENOUS
  Filled 2020-05-20 (×2): qty 1

## 2020-05-20 MED ORDER — LORAZEPAM 2 MG/ML IJ SOLN
1.0000 mg | Freq: Once | INTRAMUSCULAR | Status: AC
  Administered 2020-05-20: 1 mg via INTRAVENOUS
  Filled 2020-05-20: qty 1

## 2020-05-20 MED ORDER — LORAZEPAM 2 MG/ML IJ SOLN: 0.0000 mg | Freq: Two times a day (BID) | INTRAMUSCULAR | Status: DC

## 2020-05-20 MED ORDER — LORAZEPAM 1 MG PO TABS: 0.0000 mg | ORAL_TABLET | Freq: Four times a day (QID) | ORAL | Status: DC

## 2020-05-20 MED ORDER — LORAZEPAM 1 MG PO TABS: 0.0000 mg | ORAL_TABLET | Freq: Two times a day (BID) | ORAL | Status: DC

## 2020-05-20 NOTE — ED Notes (Signed)
CIWA score of 15.

## 2020-05-20 NOTE — ED Provider Notes (Signed)
I was asked to see the patient at request of nurse.  Patient was seen yesterday.  He has been on an overnight hold in the ED. It appears that he was not deemed appropriate for admission by the hospitalist service.  It is somewhat unclear as to why he remained in the ED overnight.  That being said, the patient is now comfortable.  He desires discharge.  He is sober enough to be able to make safe decisions regarding his care.  He does report feeling a little "shaky" and requested a dose of Ativan prior to discharge.  His last drink was last night prior to arrival.  He does report having a place to stay.      Wynetta Fines, MD 05/20/20 [phone]

## 2020-05-28 ENCOUNTER — Other Ambulatory Visit: Payer: Self-pay

## 2020-05-28 ENCOUNTER — Encounter (HOSPITAL_COMMUNITY): Payer: Self-pay | Admitting: Emergency Medicine

## 2020-05-28 ENCOUNTER — Emergency Department (HOSPITAL_COMMUNITY)
Admission: EM | Admit: 2020-05-28 | Discharge: 2020-05-28 | Disposition: A | Discharge status: Home / Self Care | Payer: Self-pay | Attending: Emergency Medicine | Admitting: Emergency Medicine

## 2020-05-28 DIAGNOSIS — F1010 Alcohol abuse, uncomplicated: Secondary | ICD-10-CM | POA: Insufficient documentation

## 2020-05-28 DIAGNOSIS — F10239 Alcohol dependence with withdrawal, unspecified: Secondary | ICD-10-CM | POA: Insufficient documentation

## 2020-05-28 DIAGNOSIS — Z5321 Procedure and treatment not carried out due to patient leaving prior to being seen by health care provider: Secondary | ICD-10-CM | POA: Insufficient documentation

## 2020-05-28 DIAGNOSIS — Z87891 Personal history of nicotine dependence: Secondary | ICD-10-CM | POA: Insufficient documentation

## 2020-05-28 DIAGNOSIS — Z79899 Other long term (current) drug therapy: Secondary | ICD-10-CM | POA: Insufficient documentation

## 2020-05-28 DIAGNOSIS — Z21 Asymptomatic human immunodeficiency virus [HIV] infection status: Secondary | ICD-10-CM | POA: Insufficient documentation

## 2020-05-28 NOTE — ED Triage Notes (Signed)
Per pt, states he wants to stop drinking-needs to get help for his withdrawals so he can quit drinking-states last drink was at 5pm yesterday-states he drinks a fifth of vodka, wine, beer a day-tremors, nausea-states he had a seizure yesterday

## 2020-05-28 NOTE — ED Triage Notes (Signed)
Patient left due to wait, now back wanted treatment for his ETOH withdrawls

## 2020-05-29 ENCOUNTER — Emergency Department (HOSPITAL_COMMUNITY)
Admission: EM | Admit: 2020-05-29 | Discharge: 2020-05-29 | Disposition: A | Discharge status: Home / Self Care | Payer: Self-pay | Attending: Emergency Medicine | Admitting: Emergency Medicine

## 2020-05-29 DIAGNOSIS — F1010 Alcohol abuse, uncomplicated: Secondary | ICD-10-CM

## 2020-05-29 MED ORDER — CARBAMAZEPINE 200 MG PO TABS
800.0000 mg | ORAL_TABLET | Freq: Once | ORAL | Status: AC
  Administered 2020-05-29: 800 mg via ORAL
  Filled 2020-05-29: qty 4

## 2020-05-29 MED ORDER — CARBAMAZEPINE 200 MG PO TABS
ORAL_TABLET | ORAL | 0 refills | Status: DC
Start: 2020-05-30 — End: 2020-06-18

## 2020-05-29 MED ORDER — ONDANSETRON 8 MG PO TBDP
8.0000 mg | ORAL_TABLET | Freq: Once | ORAL | Status: AC
  Administered 2020-05-29: 8 mg via ORAL
  Filled 2020-05-29: qty 1

## 2020-05-29 MED ORDER — THIAMINE HCL 100 MG PO TABS
100.0000 mg | ORAL_TABLET | Freq: Once | ORAL | Status: AC
  Administered 2020-05-29: 100 mg via ORAL
  Filled 2020-05-29: qty 1

## 2020-05-29 NOTE — Discharge Instructions (Addendum)
Substance Abuse Treatment Programs ° °Intensive Outpatient Programs °[facility]     °[address]      °[place], [place]                   °[phone]      ° °[facility] °[address] °[place], [place] °336-379-7146 ° °[facility]     °(Inpatient and outpatient)     °[address]           °[phone]   ° °[facility] °336-288-1484 (Suboxone and Methadone) ° °[address]      °[place], [place] [address]      °336-882-2125      ° °[address] °[place], [place] °852-3033 ° °[facility] (Outpatient/Inpatient, [facility])    °(insurance only) [phone]      °       °[facility] (Groups & Residential) °[place], [place] °336-389-1413 ° °   °[facility]     °[address]     °[place], [place]      °336-389-1413      ° °[facility] (for caregivers and family) °[address] °[place], [place] °336-299-4655 ° ° ° ° ° °Residential Treatment Programs °[facility]      °[address], [place], [place] [address]  °([phone]) [phone]      ° °[facility] °[address] [place], [place] [address] °[phone] ° °[facility]        °336-248-8914      ° °[facility] °1-800-659-3381 ° °[facility]             °[address]                                         °[place], [place]                                                °877-615-2722 or 336-784-9470                              ° °[facility] [place] °[address] °[place], [address] °1.877.941.8954 ° °[facility]    °[address]      °[place], [place]     °[phone]      ° °[facility] °[address] °[place], [place] °336-285-9073 ° °[facility]   °[address]     °[place], [place] [address]     °336-899-1550      °Admissions: 8am-3pm M-F ° °[facility] °[address] °[place],  [place] °336-227-7417 ° °[facility]: Residential Program (90 Days)   °[place], [place]      °336-725-8389 or 800-758-6077    ° °[facility] °[place], [place] °(Walk in Hours over the weekend or by referral) ° °[facility] °[address], [place], [place] [address] °(336) [phone] ° °Crisis Mobile: [facility]:  1-877-626-1772 (for crisis response 24 hours a day) °[facility] Hotline:      1-800-256-2452 °Outpatient Psychiatry and Counseling ° °[facility]: Mobile Crisis   Management 24 hours:  1-877-626-1772 ° °[facility] sliding scale fee and walk in schedule: M-F 8am-12pm/1pm-3pm °[address]  °[place], [place] [address] °[phone] ° °[facility] °[address] °[place], [place] [address] °336-703-9650 ° °[facility] (Former[facility] known [facility])- new patient walk-in appointments available Monday - Friday 8am -3pm.          °[address] °[place], [place] [address] °336-676-6840 or crisis line- 336-676-6905 ° °[facility]/ Intensive Outpatient Therapy Program °[address] °[place], [place] [address] °336-832-9804 ° °[facility]                  °Crisis Services      °[phone]      °[address]     °[place], [place] [address]                ° °[facility]   °[facility] °[phone] °[address] °[place], [place] [address] ° ° °[facility]          °[address],  °[place], [place] [address]       °(336) [phone] ° °[facility] °[address] °[place], [place] [address] °336-292-1510 ° °[facility]    °[address]    °[place], [place] [address]     °336-632-3505      ° °Parish McKinney, MD     °[address]     °[place] [address]     °336-282-1251     °  °[facility] °[address] °[place] [address] ° °[facility]     °[address]     °[place], [place]      °336-542-2076      ° °[facility] °Shamsher Ahluwalia, MD °[address] °[place], [place] [address] °336-420-9558 ° °[facility]     °[address]     °[place], [place] [address]     °336-274-1237      ° °[facility] °[address] °[place], [place] [address] °336-854-4450 °Resources for Temporary Residential Assistance/Crisis Centers ° °DAY CENTERS °[facility] °M-F 8am-3pm   °[address] [place], [place] [address]   336-332-0824 °Services include: laundry, barbering, support groups, case management, phone  & computer access, showers, AA/NA mtgs, mental health/substance abuse nurse, job skills class, disability information, VA assistance, spiritual classes, etc.  ° °HOMELESS SHELTERS ° °[facility]     °[facility]   °[address], [place]     °336.271.5959       °       °[facility] (women and children)       °[address] °[place], [place] [address] °336-275-0820 °[email or web address] for application and process °Application Required ° °[facility]   °[address]    °[place] [address]     °[phone]       °             °[facility] °[address] °, [place] [address] °336.273.5572 °336-235-0363(schedule application appt.) °Application Required ° °[facility] (women only)    °[address]     °[place], [place] [address]     °336-884-1039      °  Intake starts 6pm daily °Need valid ID, SSC, & Police report °[facility] [place] °[address] °[place], [place] °336-881-5420 °Application Required ° °[facility] (men only)     °[address]      °[place], [place]     °[phone]      ° °[facility] °(Pregnant women only) °[address] °[place], [place] °336-275-0206 ° °[facility]      °[address]      °[place], [place] [address]     °336-722-9951      °       °[facility] °[address] °[place], [place] °336-723-1848 °90 day commitment/SA/Application process ° °[facility](men only)     °[address]     °[place], [place]     °[phone]       °Check-in at 7pm     °       °[facility] °[address] °[place], [place] [address] °336-248-6684 °Men/Women/Women and Children must be there by 7 pm ° °[facility] °[place], [place] °336-722-8721                ° °

## 2020-05-29 NOTE — ED Provider Notes (Signed)
[facility]-EMERGENCY DEPT Provider Note   CSN: [id number] Arrival date & time: 05/28/20  1349     History Chief Complaint  Patient presents with  . ETOH detox    Gary [name] is a 34 y.o. male with past medical history significant for alcohol abuse, HIV, alcoholic hepatitis.   HPI Patient presents to emergency department today with chief complaint of requesting alcohol detox.  Patient states he is a daily drinker.  His last drink was at 10 AM yesterday, it is currently 450 AM.  Patient states he drinks 4-5 40s or 4 5 bottles of wine daily.  He is currently endorsing body aches and nausea.  He is concerned he is going to go through alcohol withdrawal.  He denies any suicidal or homicidal ideations. Denies anxiety, hallucinations, diaphoresis.      Past Medical History:  Diagnosis Date  . Alcohol abuse   . HIV (human immunodeficiency virus infection) (HCC)   . Subdural hematoma [facility])     Patient Active Problem List   Diagnosis Date Noted  . Malnutrition of moderate degree 05/07/2020  . Gastrointestinal hemorrhage   . Alcoholic hepatitis without ascites   . Melena 05/05/2020  . Fever 05/05/2020  . Alcoholic ketoacidosis 05/05/2020  . Alcohol withdrawal (HCC) 04/17/2020  . Thrombocytopenia (HCC) 07/25/2019  . Transaminitis 07/25/2019  . Diarrhea 07/25/2019  . Traumatic subdural hematoma (HCC) 07/02/2019  . Alcohol abuse with intoxication (HCC) 07/02/2019  . Alcohol abuse with alcohol-induced mood disorder (HCC) 05/13/2019  . Alcohol dependence (HCC) 04/16/2019  . 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 HIV disease (HCC) 06/16/2018  . Polysubstance abuse (HCC) 06/16/2018  . Cigarette smoker 06/16/2018    Past Surgical History:  Procedure Laterality Date  . INCISION AND DRAINAGE PERIRECTAL ABSCESS N/A 09/24/2016   Procedure: IRRIGATION AND DEBRIDEMENT PERIRECTAL ABSCESS;  Surgeon: Ricarda Frame, MD;  Location: [facility] ORS;  Service: General;  Laterality: N/A;  . none         Family History  Problem Relation Age of Onset  . Alcohol abuse Mother   . Alcohol abuse Father     Social History   Tobacco Use  . Smoking status: Former Smoker    Packs/day: 0.50    Types: Cigarettes  . Smokeless tobacco: Never Used  . Tobacco comment: unable to smoke while incarcerated 6+ months 02/13/20  Vaping Use  . Vaping Use: Never used  Substance Use Topics  . Alcohol use: Yes    Alcohol/week: 3.0 standard drinks    Types: 3 Cans of beer per week    Comment: 24 oz  . Drug use: Not Currently    Types: Marijuana, Methamphetamines    Comment: last used 04/10/2019    Home Medications Prior to Admission medications   Medication Sig Start Date End Date Taking? Authorizing Provider  elvitegravir-cobicistat-emtricitabine-tenofovir (GENVOYA) 150-150-200-10 MG TABS tablet Take 1 tablet by mouth daily with breakfast. 05/18/20  Yes Linwood Dibbles, MD  carbamazepine (TEGRETOL) 200 MG tablet 600mg  PO QD X 1D, then 400mg  QD X 1D, then 200mg  PO QD X 2D 05/30/20   Inza Mikrut E, PA-C  folic acid (FOLVITE) 1 MG tablet Take 1 tablet (1 mg total) by mouth daily. 05/09/20   , MD  nicotine (NICODERM CQ - DOSED IN MG/24 HOURS) 21 mg/24hr patch Place 1 patch (21 mg total) onto the skin daily. 05/09/20   07/10/20, MD  pantoprazole (PROTONIX) 40 MG tablet Take 1 tablet (  40 mg total) by mouth daily. 05/09/20   Burnadette Pop, MD  sucralfate (CARAFATE) 1 GM/10ML suspension Take 10 mLs (1 g total) by mouth 4 (four) times daily -  with meals and at bedtime for 14 days. 05/09/20 05/23/20  Burnadette Pop, MD  thiamine 100 MG tablet Take 1 tablet (100 mg total) by mouth daily. 05/09/20   Burnadette Pop, MD  famotidine (PEPCID) 20 MG tablet Take 1 tablet (20 mg total) by mouth 2 (two) times daily. Patient not taking: Reported on 06/01/2019 05/14/19 06/02/19  Benjiman Core, MD    Allergies     Caffeine  Review of Systems   Review of Systems  Constitutional: Negative for chills and fever.  HENT: Negative for congestion, rhinorrhea, sinus pressure and sore throat.   Eyes: Negative for pain and redness.  Respiratory: Negative for cough, shortness of breath and wheezing.   Cardiovascular: Negative for chest pain and palpitations.  Gastrointestinal: Negative for abdominal pain, constipation, diarrhea, nausea and vomiting.  Genitourinary: Negative for dysuria.  Musculoskeletal: Negative for arthralgias, back pain, myalgias and neck pain.  Skin: Negative for rash and wound.  Neurological: Negative for dizziness, syncope, weakness, numbness and headaches.  Psychiatric/Behavioral: Negative for confusion and hallucinations.    Physical Exam Updated Vital Signs BP (!) 145/99   Pulse 89   Temp 97.9 F (36.6 C) (Oral)   Resp 16   SpO2 98%   Physical Exam Vitals and nursing note reviewed.  Constitutional:      General: He is not in acute distress.    Appearance: He is not ill-appearing.     Comments: Disheveled. Thin appearing male  HENT:     Head: Normocephalic and atraumatic.     Right Ear: Tympanic membrane and external ear normal.     Left Ear: Tympanic membrane and external ear normal.     Nose: Nose normal.     Mouth/Throat:     Mouth: Mucous membranes are moist.     Pharynx: Oropharynx is clear.  Eyes:     General: No scleral icterus.       Right eye: No discharge.        Left eye: No discharge.     Extraocular Movements: Extraocular movements intact.     Conjunctiva/sclera: Conjunctivae normal.     Pupils: Pupils are equal, round, and reactive to light.  Neck:     Vascular: No JVD.  Cardiovascular:     Rate and Rhythm: Normal rate and regular rhythm.     Pulses: Normal pulses.          Radial pulses are 2+ on the right side and 2+ on the left side.     Heart sounds: Normal heart sounds.  Pulmonary:     Comments: Lungs clear to auscultation in all fields.  Symmetric chest rise. No wheezing, rales, or rhonchi. Abdominal:     Comments: Abdomen is soft, non-distended, and non-tender in all quadrants. No rigidity, no guarding. No peritoneal signs.  Musculoskeletal:        General: Normal range of motion.     Cervical back: Normal range of motion.     Comments: Wearing knee immobilizer on right lower extremity  Skin:    General: Skin is warm and dry.     Capillary Refill: Capillary refill takes less than 2 seconds.  Neurological:     Mental Status: He is oriented to person, place, and time.     GCS: GCS eye subscore is 4. GCS verbal subscore  is 5. GCS motor subscore is 6.     Comments: Fluent speech, no facial droop.  No tremors noted   Psychiatric:        Behavior: Behavior normal.     ED Results / Procedures / Treatments   Labs (all labs ordered are listed, but only abnormal results are displayed) Labs Reviewed - No data to display  EKG None  Radiology No results found.  Procedures Procedures (including critical care time)  Medications Ordered in ED Medications  carbamazepine (TEGRETOL) tablet 800 mg (800 mg Oral Given 05/29/20 0454)  ondansetron (ZOFRAN-ODT) disintegrating tablet 8 mg (8 mg Oral Given 05/29/20 0502)  thiamine tablet 100 mg (100 mg Oral Given 05/29/20 0518)    ED Course  I have reviewed the triage vital signs and the nursing notes.  Pertinent labs & imaging results that were available during my care of the patient were reviewed by me and considered in my medical decision making (see chart for details).    MDM Rules/Calculators/A&P                          History provided by patient with additional history obtained from chart review.    34 yo male presenting with request for alcohol detox. CIWA score of 3. Patient had prolonged wait time in the lobby of 14 hours. He has had no evidence of alcohol withdrawal, DTs, or seizure activity. Vitals are stable. I checked patient's pulse and it is in the  80s.  Discussed case with ED attending Dr. Nicanor Alcon who agrees with plan for tegretol taper as he has not had success with librium taper in the past and discharge home. Patient given community resources.  The patient appears reasonably screened and/or stabilized for discharge and I doubt any other medical condition or other [facility] requiring further screening, evaluation, or treatment in the ED at this time prior to discharge. The patient is safe for discharge with strict return precautions discussed. Recommend pcp and ID follow up.   Portions of this note were generated with Scientist, clinical (histocompatibility and immunogenetics). Dictation errors may occur despite best attempts at proofreading.   Final Clinical Impression(s) / ED Diagnoses Final diagnoses:  Alcohol abuse    Rx / DC Orders ED Discharge Orders         Ordered    carbamazepine (TEGRETOL) 200 MG tablet     Discontinue  Reprint     05/29/20 0438           Sherene Sires, PA-C 05/29/20 0755    Palumbo, April, MD 05/30/20 2307

## 2020-05-29 NOTE — ED Notes (Signed)
Pt given script for meds, a list of detox facilities, and a bus pass. VSS.

## 2020-06-07 ENCOUNTER — Encounter (HOSPITAL_COMMUNITY): Payer: Self-pay | Admitting: Emergency Medicine

## 2020-06-07 ENCOUNTER — Other Ambulatory Visit: Payer: Self-pay

## 2020-06-07 ENCOUNTER — Emergency Department (HOSPITAL_COMMUNITY)
Admission: EM | Admit: 2020-06-07 | Discharge: 2020-06-08 | Disposition: A | Discharge status: Other Acute Inpatient Hospital | Payer: Self-pay | Attending: Emergency Medicine | Admitting: Emergency Medicine

## 2020-06-07 DIAGNOSIS — Y907 Blood alcohol level of 200-239 mg/100 ml: Secondary | ICD-10-CM | POA: Insufficient documentation

## 2020-06-07 DIAGNOSIS — R45851 Suicidal ideations: Secondary | ICD-10-CM

## 2020-06-07 DIAGNOSIS — F332 Major depressive disorder, recurrent severe without psychotic features: Secondary | ICD-10-CM | POA: Insufficient documentation

## 2020-06-07 DIAGNOSIS — R682 Dry mouth, unspecified: Secondary | ICD-10-CM | POA: Insufficient documentation

## 2020-06-07 DIAGNOSIS — Z87891 Personal history of nicotine dependence: Secondary | ICD-10-CM | POA: Insufficient documentation

## 2020-06-07 DIAGNOSIS — F1020 Alcohol dependence, uncomplicated: Secondary | ICD-10-CM | POA: Insufficient documentation

## 2020-06-07 DIAGNOSIS — Z20822 Contact with and (suspected) exposure to covid-19: Secondary | ICD-10-CM | POA: Insufficient documentation

## 2020-06-07 DIAGNOSIS — F419 Anxiety disorder, unspecified: Secondary | ICD-10-CM | POA: Insufficient documentation

## 2020-06-07 DIAGNOSIS — F1010 Alcohol abuse, uncomplicated: Secondary | ICD-10-CM

## 2020-06-07 LAB — COMPREHENSIVE METABOLIC PANEL
ALT: 134 U/L — ABNORMAL HIGH (ref 0–44)
AST: 134 U/L — ABNORMAL HIGH (ref 15–41)
Albumin: 4.2 g/dL (ref 3.5–5.0)
Alkaline Phosphatase: 73 U/L (ref 38–126)
Anion gap: 19 — ABNORMAL HIGH (ref 5–15)
BUN: 13 mg/dL (ref 6–20)
CO2: 18 mmol/L — ABNORMAL LOW (ref 22–32)
Calcium: 9 mg/dL (ref 8.9–10.3)
Chloride: 102 mmol/L (ref 98–111)
Creatinine, Ser: 1.06 mg/dL (ref 0.61–1.24)
GFR calc Af Amer: >60 mL/min (ref >60)
GFR calc non Af Amer: >60 mL/min (ref >60)
Glucose, Bld: 250 mg/dL — ABNORMAL HIGH (ref 70–99)
Potassium: 3.9 mmol/L (ref 3.5–5.1)
Sodium: 139 mmol/L (ref 135–145)
Total Bilirubin: 1 mg/dL (ref 0.3–1.2)
Total Protein: 7.6 g/dL (ref 6.5–8.1)

## 2020-06-07 LAB — CBC
HCT: 45.1 % (ref 39.0–52.0)
Hemoglobin: 15 g/dL (ref 13.0–17.0)
MCH: 33 pg (ref 26.0–34.0)
MCHC: 33.3 g/dL (ref 30.0–36.0)
MCV: 99.1 fL (ref 80.0–100.0)
Platelets: 165 10*3/uL (ref 150–400)
RBC: 4.55 MIL/uL (ref 4.22–5.81)
RDW: 14.6 % (ref 11.5–15.5)
WBC: 4.5 10*3/uL (ref 4.0–10.5)
nRBC: 0 % (ref 0.0–0.2)

## 2020-06-07 LAB — SALICYLATE LEVEL: Salicylate Lvl: <7 mg/dL — ABNORMAL LOW (ref 7.0–30.0)

## 2020-06-07 LAB — SARS CORONAVIRUS 2 BY RT PCR (HOSPITAL ORDER, PERFORMED IN ~~LOC~~ HOSPITAL LAB): SARS Coronavirus 2: NEGATIVE

## 2020-06-07 LAB — RAPID URINE DRUG SCREEN, HOSP PERFORMED
Amphetamines: NOT DETECTED
Barbiturates: NOT DETECTED
Benzodiazepines: POSITIVE — AB
Cocaine: NOT DETECTED
Opiates: NOT DETECTED
Tetrahydrocannabinol: NOT DETECTED

## 2020-06-07 LAB — ACETAMINOPHEN LEVEL: Acetaminophen (Tylenol), Serum: <10 ug/mL — ABNORMAL LOW (ref 10–30)

## 2020-06-07 LAB — ETHANOL: Alcohol, Ethyl (B): 373 mg/dL (ref <10)

## 2020-06-07 MED ORDER — THIAMINE HCL 100 MG/ML IJ SOLN
100.0000 mg | Freq: Every day | INTRAMUSCULAR | Status: DC
  Administered 2020-06-07: 100 mg via INTRAVENOUS
  Filled 2020-06-07: qty 2

## 2020-06-07 MED ORDER — PANTOPRAZOLE SODIUM 40 MG PO TBEC
40.0000 mg | DELAYED_RELEASE_TABLET | Freq: Every day | ORAL | Status: DC
  Administered 2020-06-07 – 2020-06-08 (×2): 40 mg via ORAL
  Filled 2020-06-07 (×2): qty 1

## 2020-06-07 MED ORDER — LACTATED RINGERS IV BOLUS
1000.0000 mL | Freq: Once | INTRAVENOUS | Status: AC
  Administered 2020-06-07: 1000 mL via INTRAVENOUS

## 2020-06-07 MED ORDER — THIAMINE HCL 100 MG PO TABS
100.0000 mg | ORAL_TABLET | Freq: Every day | ORAL | Status: DC
  Administered 2020-06-08: 100 mg via ORAL
  Filled 2020-06-07: qty 1

## 2020-06-07 MED ORDER — NICOTINE 21 MG/24HR TD PT24: 21.0000 mg | MEDICATED_PATCH | Freq: Every day | TRANSDERMAL | Status: DC

## 2020-06-07 MED ORDER — ONDANSETRON HCL 4 MG/2ML IJ SOLN
4.0000 mg | INTRAMUSCULAR | Status: AC
  Administered 2020-06-07: 4 mg via INTRAVENOUS
  Filled 2020-06-07: qty 2

## 2020-06-07 MED ORDER — ELVITEG-COBIC-EMTRICIT-TENOFAF 150-150-200-10 MG PO TABS
1.0000 | ORAL_TABLET | Freq: Every day | ORAL | Status: DC
  Administered 2020-06-08: 1 via ORAL
  Filled 2020-06-07 (×2): qty 1

## 2020-06-07 MED ORDER — LORAZEPAM 1 MG PO TABS
0.0000 mg | ORAL_TABLET | Freq: Four times a day (QID) | ORAL | Status: DC
  Administered 2020-06-07: 1 mg via ORAL
  Administered 2020-06-08: 2 mg via ORAL
  Administered 2020-06-08: 1 mg via ORAL
  Filled 2020-06-07: qty 2
  Filled 2020-06-07 (×2): qty 1

## 2020-06-07 MED ORDER — LORAZEPAM 1 MG PO TABS: 0.0000 mg | ORAL_TABLET | Freq: Two times a day (BID) | ORAL | Status: DC

## 2020-06-07 MED ORDER — LORAZEPAM 2 MG/ML IJ SOLN: 0.0000 mg | Freq: Two times a day (BID) | INTRAMUSCULAR | Status: DC

## 2020-06-07 MED ORDER — LORAZEPAM 2 MG/ML IJ SOLN
0.0000 mg | Freq: Four times a day (QID) | INTRAMUSCULAR | Status: DC
  Administered 2020-06-07: 3 mg via INTRAVENOUS
  Filled 2020-06-07: qty 2

## 2020-06-07 NOTE — BH Assessment (Signed)
Comprehensive Clinical Assessment (CCA) Note  06/07/2020 Gary [name] [id number]  Visit Diagnosis: F33.2  Major depressive disorder, Recurrent episode, Severe, F10.20 Alcohol use disorder, Severe    ICD-10-CM   1. Suicidal ideation  R45.851   2. Alcohol abuse  F10.10      Disposition: Per Nira ConnJason, Berry NP pt meets inpatient criteria. Per Binnie RailJoAnn Glover, RN, [facility] neg [facility], [facility], Attending provider Nehemiah MassedFernando Cobos, MD  Pt is a 34 yr old male. [facility]. Pt reports the reason for today's visit is due to him trying to drink himself to death. Pt reports being angry at himself, because he knows he is better person. Pt reports being homeless in the [place] area. Pt reports the last living arrangement he had was one month ago with ex bf [place], [place]. Pt reports having depression and anxiety. Pt reports feeling this way for a week straight. Pt reports SI, with a plan to drink himself to death. Pt reports actively looking for sharp objects in ED RM to harm self. Pt denies cutting and burning. Pt reports drinking is a easier way to harm self/die, because you just drink and not wake up. Pt denies HI,AVH. Pt reports access to guns are hard, whereas, knives are easy to get.   PT reports not getting enough sleep. Pt reports only being able to sleep when he drinks enough alcohol. Pt reports being tired and that alcohol sucks the life out of you. Pt reports feeling like he has let everyone down. Pt reports noticing his appetite decrease. Pt reports have not eaten in the past 3 days. Pt reports losing weight. Pr reports starting weight of 170, and current weight of 150 or even lighter.    Pt denies any physical/emotional/sexual abuse. Pt denies any CPS/APS involvement. Pt denies any legal issues. Pt denies any substance abuse. Pt reports drinking 5-6 bottles or wine, 2 fifths of Vodka and 40oz of beer.  Pt denies having a therapist/  psychiatrist.    This counselor discussed pt's current symptoms and recommendation with Nira ConnJason, Berry, NP.  Per Nira ConnJason, Berry NP pt meets inpatient criteria. Per Binnie RailJoAnn Glover, RN, [facility] pending neg covid, [facility], Attending provider Nehemiah MassedFernando Cobos, MD        [facility] to Hosp-Admission (Discharged) from [facility]  C-SSRS RISK CATEGORY High Risk Error: Q3, 4, or 5 should not be populated when Q2 is No Low Risk       CCA Screening, Triage and Referral (STR)  Patient Reported Information How did you hear about us? No data recorded Referral name: No data recorded Referral phone number: No data recorded  Whom do you see for routine medical problems? No data recorded Practice/Facility Name: No data recorded Practice/Facility Phone Number: No data recorded Name of Contact: No data recorded Contact Number: No data recorded Contact Fax Number: No data recorded Prescriber Name: No data recorded Prescriber Address (if known): No data recorded  What Is the Reason for Your Visit/Call Today? No data recorded How Long Has This Been Causing You Problems? > than 6 months  What Do You Feel Would Help You the Most Today? No data recorded  Have You Recently Been in Any Inpatient Treatment (Hospital/Detox/Crisis Center/28-Day Program)? Yes  Name/Location of Program/Hospital:No data recorded  How Long Were You There? No data recorded When Were You Discharged? No data recorded  Have You Ever Received Services From [facility] Before? Yes  Who Do You See at [facility]? No data recorded  Have You Recently Had Any Thoughts About Hurting Yourself? Yes (Pt reports trying to drink himself to death)  Are You Planning to Commit Suicide/Harm Yourself At This time? Yes (Pt reports currently looking for sharp  object in the ED RM to harm self)   Have you Recently Had Thoughts About Hurting Someone [name]? No  Explanation: No data recorded  Have You Used Any Alcohol or Drugs in the Past 24 Hours? Yes  How Long Ago Did You Use Drugs or Alcohol? No data recorded What Did You Use and How Much? No data recorded  Do You Currently Have a Therapist/Psychiatrist? No (Pt denies having therapist/ psychiatrist)  Name of Therapist/Psychiatrist: No data recorded  Have You Been Recently Discharged From Any Office Practice or Programs? No data recorded Explanation of Discharge From Practice/Program: No data recorded    CCA Screening Triage Referral Assessment Type of Contact: Tele-Assessment  Is this Initial or Reassessment? No data recorded Date Telepsych consult ordered in [facility]:  No data recorded Time Telepsych consult ordered in [facility]:  No data recorded  Patient Reported Information Reviewed? No data recorded Patient Left Without Being Seen? No data recorded Reason for Not Completing Assessment: No data recorded  Collateral Involvement: No data recorded  Does Patient Have a Court Appointed Legal Guardian? No data recorded Name and Contact of Legal Guardian: No data recorded If Minor and Not Living with Parent(s), Who has Custody? No data recorded Is CPS involved or ever been involved? Never  Is APS involved or ever been involved? Never   Patient Determined To Be At Risk for Harm To Self or Others Based on Review of Patient Reported Information or Presenting Complaint? Yes, for Self-Harm  Method: No data recorded Availability of Means: No data recorded Intent: No data recorded Notification Required: No data recorded Additional Information for Danger to Others Potential: No data recorded Additional Comments for Danger to Others Potential: No data recorded Are There Guns or Other Weapons in Your Home? No data recorded Types of Guns/Weapons: No data recorded Are These Weapons Safely Secured?                             No data recorded Who Could Verify You Are Able To Have These Secured: No data recorded Do You Have any Outstanding Charges, Pending Court Dates, Parole/Probation? No data recorded Contacted To Inform of Risk of Harm To Self or Others: No data recorded  Location of Assessment: [facility] ED   Does Patient Present under Involuntary Commitment? No  IVC Papers Initial File Date: No data recorded  [place] of Residence: [place] (pt reports being homeless in the [place] area. Pt reports the last living arrangement he had was one month ago with ex bf in [place], [place])   Patient Currently Receiving the Following Services: No data recorded  Determination of Need: No data recorded  Options For Referral: Outpatient Therapy;Inpatient Hospitalization   CCA Biopsychosocial  Intake/Chief Complaint:  CCA Intake With Chief Complaint CCA Part Two Date: 06/07/20 CCA Part Two Time: 2219 Chief Complaint/Presenting Problem: Pt reports trying to drink himself to death. Pt reports drinking to keep the shakes and seizures down Patient's Currently Reported Symptoms/Problems: Pt reports trying to drink himself to death. Pt  reports drinking to keep the shakes and seizures down Individual's Strengths: NA Individual's Preferences: NA Individual's Abilities: NA Type of Services Patient Feels Are Needed: NA Initial Clinical Notes/Concerns: Pt's safety  Mental Health Symptoms Depression:  Depression: Difficulty Concentrating, Change in energy/activity, Fatigue, Increase/decrease in appetite, Weight gain/loss, Duration of symptoms greater than two weeks, Sleep (too much or little)  Mania:  Mania: Change in energy/activity  Anxiety:   Anxiety: Difficulty concentrating, Fatigue, Restlessness  Psychosis:  Psychosis: None (Pt denies any AVH)  Trauma:     Obsessions:  Obsessions: Disrupts routine/functioning, Poor insight, Cause anxiety, Recurrent & persistent thoughts/impulses/images   Compulsions:  Compulsions: N/A (UTA)  Inattention:  Inattention: N/A  Hyperactivity/Impulsivity:  Hyperactivity/Impulsivity: N/A  Oppositional/Defiant Behaviors:  Oppositional/Defiant Behaviors: N/A  Emotional Irregularity:  Emotional Irregularity: Recurrent suicidal behaviors/gestures/threats  Other Mood/Personality Symptoms:      Mental Status Exam Appearance and self-care  Stature:  Stature: Average  Weight:  Weight: Average weight  Clothing:  Clothing: Neat/clean  Grooming:  Grooming: Normal  Cosmetic use:  Cosmetic Use: Age appropriate  Posture/gait:  Posture/Gait: Normal  Motor activity:  Motor Activity: Not Remarkable  Sensorium  Attention:  Attention: Normal  Concentration:  Concentration: Normal  Orientation:  Orientation: X5  Recall/memory:  Recall/Memory: Normal  Affect and Mood  Affect:  Affect: Anxious, Depressed  Mood:  Mood: Anxious, Depressed  Relating  Eye contact:  Eye Contact: Normal  Facial expression:  Facial Expression: Depressed  Attitude toward examiner:  Attitude Toward Examiner: Cooperative  Thought and Language  Speech flow: Speech Flow: Normal  Thought content:  Thought Content: Appropriate to Mood and Circumstances  Preoccupation:  Preoccupations: Suicide  Hallucinations:  Hallucinations: None (Pt denies)  Organization:     Company secretary of Knowledge:  Fund of Knowledge: Fair  Intelligence:  Intelligence:  Industrial/product designer)  Abstraction:  Abstraction:  Industrial/product designer)  Judgement:  Judgement: Impaired  Reality Testing:  Reality Testing:  (UTA)  Insight:  Insight: Poor  Decision Making:  Decision Making:  Industrial/product designer)  Social Functioning  Social Maturity:  Social Maturity:  Industrial/product designer)  Social Judgement:  Social Judgement:  (UTA)  Stress  Stressors:  Stressors: Housing (Pt reports being homeless)  Coping Ability:  Coping Ability:  Industrial/product designer)  Skill Deficits:  Skill Deficits:  Industrial/product designer)  Supports:  Supports:  Industrial/product designer)     Religion: Religion/Spirituality Are You A  Religious Person?:  Industrial/product designer)  Leisure/Recreation: Leisure / Recreation Do You Have Hobbies?: No (UTA)  Exercise/Diet: Exercise/Diet Do You Exercise?:  (UTA) Have You Gained or Lost A Significant Amount of Weight in the Past Six Months?: Yes-Lost (Pt reports being 170 pounds, not pt reports being 150 pounds or even lighter) Do You Follow a Special Diet?:  (UTA) Do You Have Any Trouble Sleeping?: Yes   CCA Employment/Education  Employment/Work Situation: Employment / Work Situation Employment situation: Unemployed (pt reports his illness being the reason why he is not employed) Has patient ever been in the [employer]?:  Industrial/product designer)  Education: Education Is Patient Currently Attending School?: No Did Garment/textile technologist From [employer]?:  (UTA) Did You Attend College?:  (UTA) Did You Attend Graduate School?:  (UTA) Did You Have Any Special Interests In School?:  (UTA) Did You Have An Individualized Education Program (IIEP):  (UTA) Did You Have Any Difficulty At School?:  (UTA) Patient's Education Has Been Impacted by Current Illness:  (UTA)   CCA Family/Childhood History  Family and Relationship History: Family history Marital status:  (UTA) Are you sexually active?:  (  UTA) Does patient have children?:  (UTA)  Childhood History:  Childhood History By whom was/is the patient raised?:  (UTA) Does patient have siblings?:  (UTA) Did patient suffer any verbal/emotional/physical/sexual abuse as a child?: No (Pt denies but has witnessed it) Did patient suffer from severe childhood neglect?: No (Pt denies) Has patient ever been sexually abused/assaulted/raped as an adolescent or adult?: No (Pt denies) Was the patient ever a victim of a crime or a disaster?:  (UTA) Witnessed domestic violence?:  (UTA) Has patient been affected by domestic violence as an adult?:  Industrial/product designer)  Child/Adolescent Assessment:     CCA Substance Use  Alcohol/Drug Use: Alcohol / Drug Use Pain Medications: see  MAR Prescriptions: see MAR Over the Counter: see MAR History of alcohol / drug use?: Yes (Pt has a hx of alcohol abuse) Withdrawal Symptoms:  (UTA) Substance #1 Name of Substance 1: Alcohol 1 - Amount (size/oz): 5-6 bottle of wine, (2) fifth of vodka and 40 oz beer 1 - Last Use / Amount: today      ASAM's:  Six Dimensions of Multidimensional Assessment  Dimension 1:  Acute Intoxication and/or Withdrawal Potential:   Dimension 1:  Description of individual's past and current experiences of substance use and withdrawal:  (UTA)  Dimension 2:  Biomedical Conditions and Complications:      Dimension 3:  Emotional, Behavioral, or Cognitive Conditions and Complications:     Dimension 4:  Readiness to Change:     Dimension 5:  Relapse, Continued use, or Continued Problem Potential:     Dimension 6:  Recovery/Living Environment:     ASAM Severity Score:    ASAM Recommended Level of Treatment: ASAM Recommended Level of Treatment:  (UTA)   Substance use Disorder (SUD) Substance Use Disorder (SUD)  Checklist Symptoms of Substance Use:  (UTA)  Recommendations for Services/Supports/Treatments: Recommendations for Services/Supports/Treatments Recommendations For Services/Supports/Treatments: Individual Therapy, Inpatient Hospitalization  DSM5 Diagnoses: Patient Active Problem List   Diagnosis Date Noted  . Malnutrition of moderate degree 05/07/2020  . Gastrointestinal hemorrhage   . Alcoholic hepatitis without ascites   . Melena 05/05/2020  . Fever 05/05/2020  . Alcoholic ketoacidosis 05/05/2020  . Alcohol withdrawal (HCC) 04/17/2020  . Thrombocytopenia (HCC) 07/25/2019  . Transaminitis 07/25/2019  . Diarrhea 07/25/2019  . Traumatic subdural hematoma (HCC) 07/02/2019  . Alcohol abuse with intoxication (HCC) 07/02/2019  . Alcohol abuse with alcohol-induced mood disorder (HCC) 05/13/2019  . Alcohol dependence (HCC) 04/16/2019  . 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 HIV disease (HCC) 06/16/2018  . Polysubstance abuse (HCC) 06/16/2018  . Cigarette smoker 06/16/2018    Patient Centered Plan: Patient is on the following Treatment Plan(s):    Referrals to Alternative Service(s): Referred to Alternative Service(s):   Place:   Date:   Time:    Referred to Alternative Service(s):   Place:   Date:   Time:    Referred to Alternative Service(s):   Place:   Date:   Time:    Referred to Alternative Service(s):   Place:   Date:   Time:     Dolores Frame, MSW, LCSW-A Triage Specialist [phone]

## 2020-06-07 NOTE — ED Notes (Signed)
TTS in process 

## 2020-06-07 NOTE — BH Assessment (Signed)
Per Nira Conn NP pt meets inpatient criteria. Per Binnie Rail, RN, [facility] pending neg covid, pt is accepted to [facility]room 306-02, Attending provider Nehemiah Massed, MD. This counselor notified Adonis Brook, RN of disposition.   Dolores Frame, MSW, LCSW-A Triage Specialist [phone]

## 2020-06-07 NOTE — ED Provider Notes (Addendum)
[facility] EMERGENCY DEPARTMENT Provider Note   CSN: [id number] Arrival date & time: 06/07/20  1201     History Chief Complaint  Patient presents with  . Suicidal  . Alcohol Problem    Gary [name] is a 34 y.o. male.  34 year old male with past medical history below including alcohol abuse, HIV who presents with alcohol abuse and SI.  Patient reports heavy alcohol use and states today he tried to kill himself by drinking too much alcohol.  His friends took the alcohol away and brought him here.  He states that he still wants to die and if he was not so weak he would get up and try to drink himself to death again.  He denies any HI.  He occasionally uses meth but denies any other regular drug use.  He reports feeling nauseated and shaky as well as generally weak.  He denies any vomiting, diarrhea, or fevers.  The history is provided by the patient.  Alcohol Problem       Past Medical History:  Diagnosis Date  . Alcohol abuse   . HIV (human immunodeficiency virus infection) (HCC)   . Subdural hematoma [facility])     Patient Active Problem List   Diagnosis Date Noted  . Malnutrition of moderate degree 05/07/2020  . Gastrointestinal hemorrhage   . Alcoholic hepatitis without ascites   . Melena 05/05/2020  . Fever 05/05/2020  . Alcoholic ketoacidosis 05/05/2020  . Alcohol withdrawal (HCC) 04/17/2020  . Thrombocytopenia (HCC) 07/25/2019  . Transaminitis 07/25/2019  . Diarrhea 07/25/2019  . Traumatic subdural hematoma (HCC) 07/02/2019  . Alcohol abuse with intoxication (HCC) 07/02/2019  . Alcohol abuse with alcohol-induced mood disorder (HCC) 05/13/2019  . Alcohol dependence (HCC) 04/16/2019  . 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 HIV disease (HCC) 06/16/2018  . Polysubstance abuse (HCC) 06/16/2018  . Cigarette smoker 06/16/2018    Past Surgical History:  Procedure Laterality Date  . INCISION AND  DRAINAGE PERIRECTAL ABSCESS N/A 09/24/2016   Procedure: IRRIGATION AND DEBRIDEMENT PERIRECTAL ABSCESS;  Surgeon: Ricarda Frame, MD;  Location: [facility] ORS;  Service: General;  Laterality: N/A;  . none         Family History  Problem Relation Age of Onset  . Alcohol abuse Mother   . Alcohol abuse Father     Social History   Tobacco Use  . Smoking status: Former Smoker    Packs/day: 0.50    Types: Cigarettes  . Smokeless tobacco: Never Used  . Tobacco comment: unable to smoke while incarcerated 6+ months 02/13/20  Vaping Use  . Vaping Use: Never used  Substance Use Topics  . Alcohol use: Yes    Alcohol/week: 3.0 standard drinks    Types: 3 Cans of beer per week    Comment: 24 oz  . Drug use: Not Currently    Types: Marijuana, Methamphetamines    Comment: last used 04/10/2019    Home Medications Prior to Admission medications   Medication Sig Start Date End Date Taking? Authorizing Provider  carbamazepine (TEGRETOL) 200 MG tablet 600mg  PO QD X 1D, then 400mg  QD X 1D, then 200mg  PO QD X 2D 05/30/20   Albrizze, E, PA-C  elvitegravir-cobicistat-emtricitabine-tenofovir (GENVOYA) 150-150-200-10 MG TABS tablet Take 1 tablet by mouth daily with breakfast. 05/18/20   06/01/20, MD  folic acid (FOLVITE) 1 MG tablet Take 1 tablet (1 mg total) by mouth daily. 05/09/20   07/19/20, MD  nicotine (NICODERM  CQ - DOSED IN MG/24 HOURS) 21 mg/24hr patch Place 1 patch (21 mg total) onto the skin daily. 05/09/20   Burnadette Pop, MD  pantoprazole (PROTONIX) 40 MG tablet Take 1 tablet (40 mg total) by mouth daily. 05/09/20   Burnadette Pop, MD  sucralfate (CARAFATE) 1 GM/10ML suspension Take 10 mLs (1 g total) by mouth 4 (four) times daily -  with meals and at bedtime for 14 days. 05/09/20 05/23/20  Burnadette Pop, MD  thiamine 100 MG tablet Take 1 tablet (100 mg total) by mouth daily. 05/09/20   Burnadette Pop, MD  famotidine (PEPCID) 20 MG tablet Take 1 tablet (20 mg total) by mouth 2 (two)  times daily. Patient not taking: Reported on 06/01/2019 05/14/19 06/02/19  Benjiman Core, MD    Allergies    Caffeine  Review of Systems   Review of Systems All other systems reviewed and are negative except that which was mentioned in HPI  Physical Exam Updated Vital Signs BP 110/80   Pulse 90   Temp 98 F (36.7 C) (Oral)   Resp 20   SpO2 100%   Physical Exam Vitals and nursing note reviewed.  Constitutional:      General: He is not in acute distress.    Appearance: He is well-developed.     Comments: Chronically ill appearing and thin, jumpy  HENT:     Head: Normocephalic and atraumatic.     Mouth/Throat:     Mouth: Mucous membranes are dry.  Eyes:     Comments: B/l conjunctival injection  Cardiovascular:     Rate and Rhythm: Normal rate and regular rhythm.     Heart sounds: Normal heart sounds. No murmur heard.   Pulmonary:     Effort: Pulmonary effort is normal.     Breath sounds: Normal breath sounds.  Abdominal:     General: Bowel sounds are normal. There is no distension.     Palpations: Abdomen is soft.     Tenderness: There is no abdominal tenderness.  Musculoskeletal:        General: No swelling.     Cervical back: Neck supple.  Skin:    General: Skin is warm and dry.  Neurological:     Mental Status: He is alert and oriented to person, place, and time.     Comments: Fluent speech  Psychiatric:        Mood and Affect: Mood is anxious.        Thought Content: Thought content includes suicidal ideation. Thought content includes suicidal plan.        Judgment: Judgment is impulsive.     ED Results / Procedures / Treatments   Labs (all labs ordered are listed, but only abnormal results are displayed) Labs Reviewed  COMPREHENSIVE METABOLIC PANEL - Abnormal; Notable for the following components:      Result Value   CO2 18 (*)    Glucose, Bld 250 (*)    AST 134 (*)    ALT 134 (*)    Anion gap 19 (*)    All other components within normal limits    ETHANOL - Abnormal; Notable for the following components:   Alcohol, Ethyl (B) 373 (*)    All other components within normal limits  SALICYLATE LEVEL - Abnormal; Notable for the following components:   Salicylate Lvl <7.0 (*)    All other components within normal limits  ACETAMINOPHEN LEVEL - Abnormal; Notable for the following components:   Acetaminophen (Tylenol), Serum <10 (*)  All other components within normal limits  RAPID URINE DRUG SCREEN, HOSP PERFORMED - Abnormal; Notable for the following components:   Benzodiazepines POSITIVE (*)    All other components within normal limits  SARS CORONAVIRUS 2 BY RT PCR (HOSPITAL ORDER, PERFORMED IN [facility])  CBC    EKG None  Radiology No results found.  Procedures Procedures (including critical care time)  Medications Ordered in ED Medications  LORazepam (ATIVAN) injection 0-4 mg (3 mg Intravenous Given 06/07/20 1754)    Or  LORazepam (ATIVAN) tablet 0-4 mg ( Oral See Alternative 06/07/20 1754)  LORazepam (ATIVAN) injection 0-4 mg (has no administration in time range)    Or  LORazepam (ATIVAN) tablet 0-4 mg (has no administration in time range)  thiamine tablet 100 mg ( Oral See Alternative 06/07/20 1754)    Or  thiamine (B-1) injection 100 mg (100 mg Intravenous Given 06/07/20 1754)  elvitegravir-cobicistat-emtricitabine-tenofovir (GENVOYA) 150-150-200-10 MG tablet 1 tablet (has no administration in time range)  pantoprazole (PROTONIX) EC tablet 40 mg (has no administration in time range)  nicotine (NICODERM CQ - dosed in mg/24 hours) patch 21 mg (has no administration in time range)  ondansetron (ZOFRAN) injection 4 mg (4 mg Intravenous Given 06/07/20 1754)  lactated ringers bolus 1,000 mL (1,000 mLs Intravenous New Bag/Given 06/07/20 1754)    ED Course  I have reviewed the triage vital signs and the nursing notes.  Pertinent labs that were available during my care of the patient were reviewed by me and  considered in my medical decision making (see chart for details).    MDM Rules/Calculators/A&P                          VS reassuring, no signs of DTs on exam. Labs show CO2 18, mildly elevated LFTs, AG 19 likely 2/2 alcohol. ETOH at triage 373. Gave fluid bolus, thiamine, zofran. I have ordered CIWA protocol to prevent w/drawal sx and consulted TTS for eval. dispo will be determined by psychiatry team recommendations.  10:53 PM Pt meets inpatient criteria and accepted to [facility].  Final Clinical Impression(s) / ED Diagnoses Final diagnoses:  None    Rx / DC Orders ED Discharge Orders    None       Ottavio Norem, Ambrose Finland, MD 06/07/20 2039    Syncere Eble, Ambrose Finland, MD 06/07/20 2253

## 2020-06-07 NOTE — ED Notes (Signed)
Patient CIWA to be reassessed at 3am for readiness to transport to [facility]

## 2020-06-07 NOTE — ED Triage Notes (Signed)
EMS stated, pt is suicidal by alcohol

## 2020-06-07 NOTE — BH Assessment (Signed)
This counselor notified EDP of pt's disposition via Epic Chat.  "Per Nira Conn NP pt meets inpatient criteria. Per Binnie Rail, RN, [facility] pending neg covid, pt is accepted to [facility] bed/room 306-02, Attending provider Nehemiah Massed, MD".   Dolores Frame, MSW, LCSW-A Triage Specialist [phone]

## 2020-06-08 ENCOUNTER — Inpatient Hospital Stay (HOSPITAL_COMMUNITY)
Admission: EM | Admit: 2020-06-08 | Discharge: 2020-06-09 | DRG: 894 | Discharge status: Against Medical Advice | Payer: Self-pay | Source: Other Acute Inpatient Hospital | Attending: Internal Medicine | Admitting: Internal Medicine

## 2020-06-08 ENCOUNTER — Emergency Department (HOSPITAL_COMMUNITY)
Admission: EM | Admit: 2020-06-08 | Discharge: 2020-06-08 | Disposition: A | Discharge status: Still In Hospital | Payer: Self-pay | Source: Home / Self Care

## 2020-06-08 ENCOUNTER — Other Ambulatory Visit: Payer: Self-pay

## 2020-06-08 ENCOUNTER — Encounter (HOSPITAL_COMMUNITY): Payer: Self-pay | Admitting: Nurse Practitioner

## 2020-06-08 DIAGNOSIS — F329 Major depressive disorder, single episode, unspecified: Secondary | ICD-10-CM | POA: Diagnosis present

## 2020-06-08 DIAGNOSIS — Z79899 Other long term (current) drug therapy: Secondary | ICD-10-CM

## 2020-06-08 DIAGNOSIS — B20 Human immunodeficiency virus [HIV] disease: Secondary | ICD-10-CM | POA: Diagnosis present

## 2020-06-08 DIAGNOSIS — R45851 Suicidal ideations: Secondary | ICD-10-CM | POA: Diagnosis present

## 2020-06-08 DIAGNOSIS — F10230 Alcohol dependence with withdrawal, uncomplicated: Principal | ICD-10-CM | POA: Diagnosis present

## 2020-06-08 DIAGNOSIS — K7460 Unspecified cirrhosis of liver: Secondary | ICD-10-CM | POA: Diagnosis present

## 2020-06-08 DIAGNOSIS — F1020 Alcohol dependence, uncomplicated: Secondary | ICD-10-CM | POA: Diagnosis present

## 2020-06-08 DIAGNOSIS — K7010 Alcoholic hepatitis without ascites: Secondary | ICD-10-CM | POA: Diagnosis present

## 2020-06-08 DIAGNOSIS — Z20822 Contact with and (suspected) exposure to covid-19: Secondary | ICD-10-CM | POA: Diagnosis present

## 2020-06-08 DIAGNOSIS — G4700 Insomnia, unspecified: Secondary | ICD-10-CM | POA: Diagnosis present

## 2020-06-08 DIAGNOSIS — Z87891 Personal history of nicotine dependence: Secondary | ICD-10-CM

## 2020-06-08 DIAGNOSIS — Z5329 Procedure and treatment not carried out because of patient's decision for other reasons: Secondary | ICD-10-CM | POA: Diagnosis not present

## 2020-06-08 DIAGNOSIS — F1024 Alcohol dependence with alcohol-induced mood disorder: Secondary | ICD-10-CM

## 2020-06-08 DIAGNOSIS — Z811 Family history of alcohol abuse and dependence: Secondary | ICD-10-CM

## 2020-06-08 DIAGNOSIS — F17210 Nicotine dependence, cigarettes, uncomplicated: Secondary | ICD-10-CM | POA: Diagnosis present

## 2020-06-08 DIAGNOSIS — F419 Anxiety disorder, unspecified: Secondary | ICD-10-CM | POA: Diagnosis present

## 2020-06-08 DIAGNOSIS — D696 Thrombocytopenia, unspecified: Secondary | ICD-10-CM | POA: Diagnosis present

## 2020-06-08 DIAGNOSIS — F10930 Alcohol use, unspecified with withdrawal, uncomplicated: Secondary | ICD-10-CM

## 2020-06-08 DIAGNOSIS — Z21 Asymptomatic human immunodeficiency virus [HIV] infection status: Secondary | ICD-10-CM | POA: Diagnosis present

## 2020-06-08 DIAGNOSIS — F1994 Other psychoactive substance use, unspecified with psychoactive substance-induced mood disorder: Secondary | ICD-10-CM | POA: Diagnosis present

## 2020-06-08 DIAGNOSIS — Z590 Homelessness: Secondary | ICD-10-CM

## 2020-06-08 DIAGNOSIS — F10239 Alcohol dependence with withdrawal, unspecified: Secondary | ICD-10-CM

## 2020-06-08 LAB — GLUCOSE, CAPILLARY: Glucose-Capillary: 100 mg/dL — ABNORMAL HIGH (ref 70–99)

## 2020-06-08 MED ORDER — LORAZEPAM 1 MG PO TABS: 1.0000 mg | ORAL_TABLET | Freq: Four times a day (QID) | ORAL | Status: DC | PRN

## 2020-06-08 MED ORDER — PANTOPRAZOLE SODIUM 40 MG PO TBEC
40.0000 mg | DELAYED_RELEASE_TABLET | Freq: Every day | ORAL | Status: DC
  Administered 2020-06-08: 40 mg via ORAL
  Filled 2020-06-08 (×3): qty 1

## 2020-06-08 MED ORDER — LORAZEPAM 2 MG/ML IJ SOLN
2.0000 mg | Freq: Once | INTRAMUSCULAR | Status: AC
  Administered 2020-06-08: 2 mg via INTRAVENOUS
  Filled 2020-06-08: qty 1

## 2020-06-08 MED ORDER — ALUM & MAG HYDROXIDE-SIMETH 200-200-20 MG/5ML PO SUSP
30.0000 mL | ORAL | Status: DC | PRN
  Filled 2020-06-08: qty 30

## 2020-06-08 MED ORDER — LORAZEPAM 1 MG PO TABS: 1.0000 mg | ORAL_TABLET | Freq: Every day | ORAL | Status: DC

## 2020-06-08 MED ORDER — HYDROXYZINE HCL 25 MG PO TABS: 25.0000 mg | ORAL_TABLET | Freq: Four times a day (QID) | ORAL | Status: DC | PRN

## 2020-06-08 MED ORDER — ELVITEG-COBIC-EMTRICIT-TENOFAF 150-150-200-10 MG PO TABS
1.0000 | ORAL_TABLET | Freq: Every day | ORAL | Status: DC
  Filled 2020-06-08: qty 1

## 2020-06-08 MED ORDER — LORAZEPAM 1 MG PO TABS: 1.0000 mg | ORAL_TABLET | Freq: Two times a day (BID) | ORAL | Status: DC

## 2020-06-08 MED ORDER — ONDANSETRON 4 MG PO TBDP
4.0000 mg | ORAL_TABLET | Freq: Four times a day (QID) | ORAL | Status: DC | PRN
  Administered 2020-06-08: 4 mg via ORAL
  Filled 2020-06-08: qty 1

## 2020-06-08 MED ORDER — LORAZEPAM 1 MG PO TABS
1.0000 mg | ORAL_TABLET | Freq: Four times a day (QID) | ORAL | Status: DC
  Administered 2020-06-08 (×2): 1 mg via ORAL
  Filled 2020-06-08 (×3): qty 1

## 2020-06-08 MED ORDER — NICOTINE 21 MG/24HR TD PT24
21.0000 mg | MEDICATED_PATCH | Freq: Every day | TRANSDERMAL | Status: DC
  Filled 2020-06-08 (×2): qty 1

## 2020-06-08 MED ORDER — MAGNESIUM HYDROXIDE 400 MG/5ML PO SUSP: 30.0000 mL | Freq: Every day | ORAL | Status: DC | PRN

## 2020-06-08 MED ORDER — LORAZEPAM 1 MG PO TABS: 1.0000 mg | ORAL_TABLET | Freq: Three times a day (TID) | ORAL | Status: DC

## 2020-06-08 MED ORDER — CHLORDIAZEPOXIDE HCL 25 MG PO CAPS
25.0000 mg | ORAL_CAPSULE | Freq: Once | ORAL | Status: AC
  Administered 2020-06-08: 25 mg via ORAL
  Filled 2020-06-08: qty 1

## 2020-06-08 MED ORDER — LOPERAMIDE HCL 2 MG PO CAPS: 2.0000 mg | ORAL_CAPSULE | ORAL | Status: DC | PRN

## 2020-06-08 MED ORDER — FOLIC ACID 1 MG PO TABS
1.0000 mg | ORAL_TABLET | Freq: Every day | ORAL | Status: DC
  Administered 2020-06-08: 1 mg via ORAL
  Filled 2020-06-08 (×3): qty 1

## 2020-06-08 MED ORDER — ADULT MULTIVITAMIN W/MINERALS CH
1.0000 | ORAL_TABLET | Freq: Every day | ORAL | Status: DC
  Filled 2020-06-08 (×3): qty 1

## 2020-06-08 MED ORDER — ACETAMINOPHEN 325 MG PO TABS
650.0000 mg | ORAL_TABLET | Freq: Four times a day (QID) | ORAL | Status: DC | PRN
Start: 2020-06-08 — End: 2020-06-08

## 2020-06-08 MED ORDER — THIAMINE HCL 100 MG PO TABS
100.0000 mg | ORAL_TABLET | Freq: Every day | ORAL | Status: DC
  Filled 2020-06-08 (×3): qty 1

## 2020-06-08 NOTE — ED Provider Notes (Signed)
Emergency Medicine Observation Re-evaluation Note  Gary [name] is a 34 y.o. male, seen on rounds today.  Pt initially presented to the ED for complaints of Suicidal and Alcohol Problem Currently, the patient is waiting for transfer to [facility]. Staff is waiting on a call back from [facility] to accept pt. Pt states he is feeling better but still not okay. He was updated on the plan and is agreeable.  Physical Exam  BP 124/85   Pulse (!) 111   Temp 98.2 F (36.8 C) (Oral)   Resp 18   SpO2 100%  Physical Exam Vitals and nursing note reviewed.  Constitutional:      General: He is not in acute distress.    Appearance: Normal appearance. He is well-developed. He is not ill-appearing.     Comments: Resting in bed. NAD. Lucid and cooperative.  HENT:     Head: Normocephalic and atraumatic.  Eyes:     General: No scleral icterus.       Right eye: No discharge.        Left eye: No discharge.     Conjunctiva/sclera: Conjunctivae normal.     Pupils: Pupils are equal, round, and reactive to light.  Cardiovascular:     Rate and Rhythm: Normal rate.  Pulmonary:     Effort: Pulmonary effort is normal. No respiratory distress.  Abdominal:     General: There is no distension.  Musculoskeletal:     Cervical back: Normal range of motion.  Skin:    General: Skin is warm and dry.  Neurological:     Mental Status: He is alert and oriented to person, place, and time.     Comments: Mild tremor noted  Psychiatric:        Behavior: Behavior normal.     ED Course / MDM  EKG:    I have reviewed the labs performed to date as well as medications administered while in observation.  Recent changes in the last 24 hours include: None Plan  Current plan is for transfer to [facility] Patient is not under full IVC at this time.   Bethel Born, PA-C 06/08/20 1141    Tegeler, Canary Brim, MD 06/08/20 [phone]

## 2020-06-08 NOTE — Progress Notes (Addendum)
   06/08/20 1300  Vital Signs  Temp 97.9 F (36.6 C)  Temp Source Oral  Pulse Rate (!) 113  Pulse Rate Source Dinamap  Resp 18  BP (!) 141/87  BP Location Left Arm  BP Method Automatic  Patient Position (if appropriate) Sitting  Oxygen Therapy  SpO2 100 %  Pain Assessment  Pain Scale 0-10  Pain Score 7  Pain Location Knee  Pain Orientation Right (Pt. stated "torn right ligament in knee")  Complaints & Interventions  Complains of Anxiety  Neuro symptoms relieved by Rest;Other (Comment) (RN aware)  Height and Weight  Height 6\' 1"  (1.854 m)  Weight 64 kg  Type of Scale Used Standing  BSA (Calculated - sq m) 1.81 sq meters  BMI (Calculated) 18.61  Weight in (lb) to have BMI = 25 189.1  CIWA-Ar  Nausea and Vomiting 1  Tactile Disturbances 0  Tremor 3  Auditory Disturbances 0  Paroxysmal Sweats 0  Visual Disturbances 2  Anxiety 3  Headache, Fullness in Head 1  Agitation 0  Orientation and Clouding of Sensorium 0  CIWA-Ar Total 10   D: Patient is a 34 y.o. Caucasion male who was  Voluntarily admitted from [facility] ED with SI with a plan to OD on ETOH. Patient is a repeat patient with a medical history of HIV, subdural hematoma and concussion (about 9 months ago from fall), thrombocytopenia, polysubstance abuse,seizures and alcoholic ketoacidosis.Patient reports that he no longer wants to die, ". Get a job.   and a home." Patient complains of agitation, anger,anxiety, decreased appetite, concentration, confusion, crying spells, depression, disorientation, irritability, insomnia, loneliness, nervousness, panic attacks, restlessness, sadness, self-harm, worrying, tension, shakiness, and suspiciousness.Patient admits to AVH. Pt. Scored 10 on the CIWA. Pt. Was given 1 mg of Ativan po. Vitals: BP 123/86,pulse 98, 97.9 A:  Patient took scheduled medicine, but is continuing to  vomit. Zofran  ODT was given. Pt. Did not take the multivitamin or Vit B. Dr. Was notified and resident  examined the patient. Dr. [facility] that the patient is not medically stable, and is sending him to the ED. Support and encouragement provided Routine safety checks conducted every 15 minutes. Patient  Informed to notify staff with any concerns.  R: Safety maintained.  Late Note:  Patient reported he is feeling dizzy and his sight is "fuzzy" Patient said "I wanted to stay to get better, but something is really wrong with my body."

## 2020-06-08 NOTE — Progress Notes (Signed)
Gorgeous Newlun, RN., charge nurse attempted to call report to the [facility] charge nurse, no response. Cicero Duck, Charity fundraiser., will be transferring the pt via EMS per Dr. Leone Haven.

## 2020-06-08 NOTE — ED Notes (Signed)
RN will hold off on transport to [facility] due to CIWA still elevated; RN attempted to contact [facility] with no answer but will pass information to on coming AM RN-[place]qi[place],RN

## 2020-06-08 NOTE — ED Notes (Addendum)
Pt noted to be lying on bed w/eyes closed - respirations even, unlabored. Woke pt - Pt then noted w/hand tremors - Pt aware and is in agreement w/tx plan - accepted to [facility]. Pt ate 50% of breakfast. Pt continues to state he is SI. Pt noted to be calm, cooperative. Pt given Ginger Ale as requested. States he drinks 1/5th of Vodka daily. Denies having any open wounds/sores.

## 2020-06-08 NOTE — BHH Suicide Risk Assessment (Signed)
[facility] Admission Suicide Risk Assessment   Nursing information obtained from:    Demographic factors:    Current Mental Status:    Loss Factors:    Historical Factors:    Risk Reduction Factors:     Total Time spent with patient: 15 minutes Principal Problem: <principal problem not specified> Diagnosis:  Active Problems:   Substance induced mood disorder (HCC)  Subjective Data: Patient is a 34 year old male with a known past psychiatric history significant for alcohol dependence who presented to the [facility] emergency department on 06/07/2020 after stating he would try drinking self to death.  He is currently homeless in the [place] area.  The patient in the electronic medical record has multiple visits secondary to alcohol-related issues over the last 2 months.  There are at least 7 emergency room visits since 04/17/2020 for alcohol-related issues.  He also has a past medical history significant for HIV disease.  He has a history of a traumatic brain injury as well.  He has a history of transaminitis, and his liver function enzymes on this visit were elevated at 134 for his AST and 134 for his ALT.  He also stated that he had been losing weight, and had not eaten in the last 3 days.  He admitted to drinking 5-6 bottles of wine a day, 2/5 of vodka, and 40 ounces of beer.  He was admitted to the hospital for evaluation and stabilization.  Continued Clinical Symptoms:    The "Alcohol Use Disorders Identification Test", Guidelines for Use in Primary Care, Second Edition.  World Science writer [facility]). Score between 0-7:  no or low risk or alcohol related problems. Score between 8-15:  moderate risk of alcohol related problems. Score between 16-19:  high risk of alcohol related problems. Score 20 or above:  warrants further diagnostic evaluation for alcohol dependence and treatment.   CLINICAL FACTORS:   Depression:   Anhedonia Comorbid alcohol  abuse/dependence Hopelessness Impulsivity Insomnia Alcohol/Substance Abuse/Dependencies   Musculoskeletal: Strength & Muscle Tone: decreased Gait & Station: unsteady Patient leans: N/A  Psychiatric Specialty Exam: Physical Exam Vitals and nursing note reviewed.  HENT:     Head: Normocephalic and atraumatic.  Pulmonary:     Effort: Pulmonary effort is normal.  Neurological:     General: No focal deficit present.     Mental Status: He is alert.     Review of Systems  There were no vitals taken for this visit.There is no height or weight on file to calculate BMI.  General Appearance: Disheveled  Eye Contact:  Minimal  Speech:  Normal Rate  Volume:  Decreased  Mood:  Dysphoric  Affect:  Congruent  Thought Process:  Goal Directed and Descriptions of Associations: Circumstantial  Orientation:  Negative  Thought Content:  Negative  Suicidal Thoughts:  Yes.  without intent/plan  Homicidal Thoughts:  No  Memory:  Immediate;   Poor Recent;   Poor Remote;   Poor  Judgement:  Impaired  Insight:  Lacking  Psychomotor Activity:  Decreased  Concentration:  Concentration: Poor and Attention Span: Poor  Recall:  Poor  Fund of Knowledge:  Poor  Language:  Good  Akathisia:  Negative  Handed:  Right  AIMS (if indicated):     Assets:  Desire for Improvement Resilience  ADL's:  Impaired  Cognition:  WNL  Sleep:         COGNITIVE FEATURES THAT CONTRIBUTE TO RISK:  None    SUICIDE RISK:   Mild:  Suicidal  ideation of limited frequency, intensity, duration, and specificity.  There are no identifiable plans, no associated intent, mild dysphoria and related symptoms, good self-control (both objective and subjective assessment), few other risk factors, and identifiable protective factors, including available and accessible social support.  PLAN OF CARE: Patient is a 34 year old male with the above-stated past medical and psychiatric history who is admitted secondary to suicidal  ideation.  He will be admitted to the hospital.  Will be integrated in the milieu.  He will be encouraged to attend groups.  He will be placed on the lorazepam detox protocol. We will let him have a standing dose for now.  If his blood pressure remains low this may be reduced.  We will also continue his Genvoya for his HIV disease.  He will be given folic acid as well as thiamine.  Because the risk for seizures and his previous traumatic brain injury we will place him on seizure precautions as well.  His liver function enzymes are elevated, and most recently on his last visit they were normal.  His blood alcohol is still significantly elevated, and we will monitor him closely for the possibility of falls.  Review of his admission laboratories revealed the elevated AST and ALT.  His blood sugar was 250.  Review of the electronic medical record did not reveal any evidence of previous diagnosis of diabetes.  We will obtain a hemoglobin A1c.  We will check blood sugars.  His CBC was essentially normal.  Differential was normal.  His acetaminophen was less than 10, salicylate less than 7.  Urinalysis was essentially negative.  Drug screen was positive for benzodiazepines.  EKG was not obtained, but we will obtain that during the course of the hospitalization.    I certify that inpatient services furnished can reasonably be expected to improve the patient's condition.   Antonieta Pert, MD 06/08/2020, 3:49 PM

## 2020-06-08 NOTE — ED Notes (Signed)
Safe Transport transporting pt to [facility] - ALL belongings - 2 labeled belongings bags - Safe Transport - Pt aware.

## 2020-06-08 NOTE — ED Notes (Addendum)
Pt able to ambulate in room and hallway w/o difficulty - Pt noted w/tremors when sitting on bed - no tremors noted when pt ambulated nor when standing. Pt also was able to take meds and swallow water w/no tremors noted.

## 2020-06-08 NOTE — Tx Team (Signed)
Initial Treatment Plan 06/08/2020 4:47 PM Gary [name] [id number]    PATIENT STRESSORS: Health problems   PATIENT STRENGTHS: Ability for insight Motivation for treatment/growth   PATIENT IDENTIFIED PROBLEMS: Anxiety  depression  ETOH  detox                 DISCHARGE CRITERIA:  Ability to meet basic life and health needs Adequate post-discharge living arrangements Improved stabilization in mood, thinking, and/or behavior  PRELIMINARY DISCHARGE PLAN: Attend aftercare/continuing care group Attend 12-step recovery group Placement in alternative living arrangements  PATIENT/FAMILY INVOLVEMENT: This treatment plan has been presented to and reviewed with the patient, Gary [name] . The patient has been given the opportunity to ask questions and make suggestions.  Wardell Heath, RN 06/08/2020, 4:47 PM

## 2020-06-08 NOTE — ED Triage Notes (Signed)
Patient was sent from [facility]. Physician stated patient is not stable because he is vomiting, tremors, and nausea. Patient has PIV #20 left forearm and given 4 mg zofran IV.

## 2020-06-08 NOTE — Progress Notes (Signed)
Patient is a 34 year old male with a known past psychiatric history significant for alcohol dependence who presented to the [facility] emergency department on 06/07/2020 after stating he would try drinking self to death.   Pt was transferred to [facility] for Alcohol Detox today.   Pt complains of severe withdrawal symptoms with sweating, shaking, vomiting, and stabbing right sided abdominal pain. Pt rates the pain as 7/10. Pt states the pain gets worse after vomiting. Pt took Ativan 1 mg and Zofran about 20 mins ago.  Labs- BAL- 373, Tox screen- positive for benzodiazepine, Anion gap-10. Glucose- 250  Vitals- BP- 123/86 mmHg, PR- 98/min SpO2- 100 Capillary Blood Glucose - 100 Case discussed with Dr. Jola Babinski. Advised to send Pt to ED.

## 2020-06-08 NOTE — H&P (Addendum)
Psychiatric Admission Assessment Adult  Patient Identification: Gary [name] MRN:  [id number] Date of Evaluation:  06/08/2020 Chief Complaint:  Substance induced mood disorder (HCC) [F19.94] Principal Diagnosis: <principal problem not specified> Diagnosis:  Active Problems:   Substance induced mood disorder (HCC)  History of Present Illness:  Male, 34 years old who looks older than stated age admitted for Alcohol detox treatment.  Seen walking the hallway with unsteady gait holding unto side rails .  He was [facility] this [facility].  Past Psychiatric hx is related to alcoholism.  He has had multiple Alcohol detox treatment in the past.  He stated that he wanted to drink himself to death..  He informs Clinical research associatewriter that he did not want to continue drinking like he has been doing. He admits to drinking 2/5 vodka, 5-6 bottles of wine, 40 oz beer daily.  He states he has lost his friends due to drinking and has no contacts with his mother and siblings.  He homeless in [place] and has no job.  He reports poor sleep and appetite and has lost 30 LBS in two months. He is tearful throughout this assessment.  He denies feeling suicidal and contracted for safety.  Liver function lab results are elevated- at 134 for his AST and 134 for his ALT.   Associated Signs/Symptoms: Depression Symptoms:  depressed mood, anhedonia, insomnia, feelings of worthlessness/guilt, hopelessness, suicidal thoughts without plan, suicidal thoughts with specific plan, anxiety, weight loss, decreased appetite, want to drink himself to death with Alcohol (Hypo) Manic Symptoms:  na Anxiety Symptoms:  Excessive Worry, Social Anxiety, related to alcohol use, loosing friends. Psychotic Symptoms:  na PTSD Symptoms: NA Total Time spent with patient: 45 minutes  Past Psychiatric History:  Alcohol use disorder, Depression and anxiety  Is the patient at risk to self? Yes.    Has the patient been a risk to self  in the past 6 months? No.  Has the patient been a risk to self within the distant past? No.  Is the patient a risk to others? No.  Has the patient been a risk to others in the past 6 months? No.  Has the patient been a risk to others within the distant past? No.   Prior Inpatient Therapy:   Prior Outpatient Therapy:    Alcohol Screening:   Substance Abuse History in the last 12 months:  Yes.   Consequences of Substance Abuse: Medical Consequences:  Liver Cirrhosis, Abnormal lifer Function test results Legal Consequences:  Possible DUI and intoxication Family Consequences:  lost family and friends and anable to keep job and housing Withdrawal Symptoms:   Nausea Tremors Previous Psychotropic Medications: No  Psychological Evaluations: No  Past Medical History:  Past Medical History:  Diagnosis Date  . Alcohol abuse   . HIV (human immunodeficiency virus infection) (HCC)   . Subdural hematoma [facility](HCC)     Past Surgical History:  Procedure Laterality Date  . INCISION AND DRAINAGE PERIRECTAL ABSCESS N/A 09/24/2016   Procedure: IRRIGATION AND DEBRIDEMENT PERIRECTAL ABSCESS;  Surgeon: Ricarda Frameharles Woodham, MD;  Location: [facility] ORS;  Service: General;  Laterality: N/A;  . none     Family History:  Family History  Problem Relation Age of Onset  . Alcohol abuse Mother   . Alcohol abuse Father    Family Psychiatric  History: Mother Alcoholic but in remission Tobacco Screening:  quit Social History: single, no children.  Mom and siblings live in different parts of [place] Social History   Substance  and Sexual Activity  Alcohol Use Yes  . Alcohol/week: 3.0 standard drinks  . Types: 3 Cans of beer per week   Comment: 24 oz     Social History   Substance and Sexual Activity  Drug Use Not Currently  . Types: Marijuana, Methamphetamines   Comment: last used 04/10/2019    Additional Social History:                           Allergies:   Allergies  Allergen Reactions  . Tegretol  [Carbamazepine] Other (See Comments)    Caused vertigo for 2 days after taking it  . Caffeine Palpitations   Lab Results:  Results for orders placed or performed during the hospital encounter of 06/07/20 (from the past 48 hour(s))  Comprehensive metabolic panel     Status: Abnormal   Collection Time: 06/07/20 12:30 PM  Result Value Ref Range   Sodium 139 135 - 145 mmol/L   Potassium 3.9 3.5 - 5.1 mmol/L   Chloride 102 98 - 111 mmol/L   CO2 18 (L) 22 - 32 mmol/L   Glucose, Bld 250 (H) 70 - 99 mg/dL    Comment: Glucose reference range applies only to samples taken after fasting for at least 8 hours.   BUN 13 6 - 20 mg/dL   Creatinine, Ser 8.24 0.61 - 1.24 mg/dL   Calcium 9.0 8.9 - 23.5 mg/dL   Total Protein 7.6 6.5 - 8.1 g/dL   Albumin 4.2 3.5 - 5.0 g/dL   AST 361 (H) 15 - 41 U/L   ALT 134 (H) 0 - 44 U/L   Alkaline Phosphatase 73 38 - 126 U/L   Total Bilirubin 1.0 0.3 - 1.2 mg/dL   GFR calc non Af Amer >60 >60 mL/min   GFR calc Af Amer >60 >60 mL/min   Anion gap 19 (H) 5 - 15    Comment: Performed at [facility], [address] [place], [place] [address]  Ethanol     Status: Abnormal   Collection Time: 06/07/20 12:30 PM  Result Value Ref Range   Alcohol, Ethyl (B) 373 (HH) <10 mg/dL    Comment: CRITICAL RESULT CALLED TO, READ BACK BY AND VERIFIED WITH: C CARLAN,RN 06/07/2020 1317 WILDERK (NOTE) Lowest detectable limit for serum alcohol is 10 mg/dL.  For medical purposes only. Performed at [facility], [address] [place], [place] [address]   Salicylate level     Status: Abnormal   Collection Time: 06/07/20 12:30 PM  Result Value Ref Range   Salicylate Lvl <7.0 (L) 7.0 - 30.0 mg/dL    Comment: Performed at [facility], [address], [place], [place] [address]  Acetaminophen level     Status: Abnormal   Collection Time: 06/07/20 12:30 PM  Result Value Ref Range   Acetaminophen (Tylenol), Serum <10 (L) 10 - 30 ug/mL    Comment:  (NOTE) Therapeutic concentrations vary significantly. A range of 10-30 ug/mL  may be an effective concentration for many patients. However, some  are best treated at concentrations outside of this range. Acetaminophen concentrations >150 ug/mL at 4 hours after ingestion  and >50 ug/mL at 12 hours after ingestion are often associated with  toxic reactions.  Performed at [facility], [address], [place], [place] [address]   cbc     Status: None   Collection Time: 06/07/20 12:30 PM  Result Value Ref Range  WBC 4.5 4.0 - 10.5 K/uL   RBC 4.55 4.22 - 5.81 MIL/uL   Hemoglobin 15.0 13.0 - 17.0 g/dL   HCT 91.4 39 - 52 %   MCV 99.1 80.0 - 100.0 fL   MCH 33.0 26.0 - 34.0 pg   MCHC 33.3 30.0 - 36.0 g/dL   RDW 78.2 95.6 - 21.3 %   Platelets 165 150 - 400 K/uL   nRBC 0.0 0.0 - 0.2 %    Comment: Performed at [facility], [address] [place], [place] [address]  Rapid urine drug screen (hospital performed)     Status: Abnormal   Collection Time: 06/07/20  3:50 PM  Result Value Ref Range   Opiates NONE DETECTED NONE DETECTED   Cocaine NONE DETECTED NONE DETECTED   Benzodiazepines POSITIVE (A) NONE DETECTED   Amphetamines NONE DETECTED NONE DETECTED   Tetrahydrocannabinol NONE DETECTED NONE DETECTED   Barbiturates NONE DETECTED NONE DETECTED    Comment: (NOTE) DRUG SCREEN FOR MEDICAL PURPOSES ONLY.  IF CONFIRMATION IS NEEDED FOR ANY PURPOSE, NOTIFY LAB WITHIN 5 DAYS.  LOWEST DETECTABLE LIMITS FOR URINE DRUG SCREEN Drug Class                     Cutoff (ng/mL) Amphetamine and metabolites    1000 Barbiturate and metabolites    200 Benzodiazepine                 200 Tricyclics and metabolites     300 Opiates and metabolites        300 Cocaine and metabolites        300 THC                            50 Performed at [facility], [address], [place], [place] [address]   SARS Coronavirus 2 by RT PCR (hospital order, performed in [facility] hospital lab)  Nasopharyngeal Nasopharyngeal Swab     Status: None   Collection Time: 06/07/20  9:03 PM   Specimen: Nasopharyngeal Swab  Result Value Ref Range   SARS Coronavirus 2 NEGATIVE NEGATIVE    Comment: (NOTE) SARS-CoV-2 target nucleic acids are NOT DETECTED.  The SARS-CoV-2 RNA is generally detectable in upper and lower respiratory specimens during the acute phase of infection. The lowest concentration of SARS-CoV-2 viral copies this assay can detect is 250 copies / mL. A negative result does not preclude SARS-CoV-2 infection and should not be used as the sole basis for treatment or other patient management decisions.  A negative result may occur with improper specimen collection / handling, submission of specimen other than nasopharyngeal swab, presence of viral mutation(s) within the areas targeted by this assay, and inadequate number of viral copies (<250 copies / mL). A negative result must be combined with clinical observations, patient history, and epidemiological information.  Fact Sheet for Patients:   BoilerBrush.com.cy  Fact Sheet for Healthcare Providers: https://pope.com/  This test is not yet approved or  cleared by the [place] FDA and has been authorized for detection and/or diagnosis of SARS-CoV-2 by FDA under an Emergency Use Authorization (EUA).  This EUA will remain in effect (meaning this test can be used) for the duration of the COVID-19 declaration under Section 564(b)(1) of the Act, 21 U.S.C. section 360bbb-3(b)(1), unless the authorization is terminated or revoked sooner.  Performed at [facility] Lab, [address] [place], [place] [address]  Blood Alcohol level:  Lab Results  Component Value Date   ETH 373 [facility]) 06/07/2020   ETH 388 (HH) 05/19/2020    Metabolic Disorder Labs:  Lab Results  Component Value Date   HGBA1C 5.3 09/24/2016   MPG 105 09/24/2016   No results found for:  PROLACTIN Lab Results  Component Value Date   CHOL 135 09/19/2019   TRIG 182 (H) 09/19/2019   HDL 28 (L) 09/19/2019   CHOLHDL 4.8 09/19/2019   LDLCALC 79 09/19/2019    Current Medications: Current Facility-Administered Medications  Medication Dose Route Frequency Provider Last Rate Last Admin  . alum & mag hydroxide-simeth (MAALOX/MYLANTA) 200-200-20 MG/5ML suspension 30 mL  30 mL Oral Q4H PRN Antonieta Pert, MD      . Melene Muller ON 06/09/2020] elvitegravir-cobicistat-emtricitabine-tenofovir (GENVOYA) 150-150-200-10 MG tablet 1 tablet  1 tablet Oral Q breakfast Jola Babinski Marlane Mingle, MD      . folic acid (FOLVITE) tablet 1 mg  1 mg Oral Daily Antonieta Pert, MD      . hydrOXYzine (ATARAX/VISTARIL) tablet 25 mg  25 mg Oral Q6H PRN Antonieta Pert, MD      . loperamide (IMODIUM) capsule 2-4 mg  2-4 mg Oral PRN Antonieta Pert, MD      . LORazepam (ATIVAN) tablet 1 mg  1 mg Oral Q6H PRN Antonieta Pert, MD      . LORazepam (ATIVAN) tablet 1 mg  1 mg Oral QID Antonieta Pert, MD       Followed by  . [START ON 06/09/2020] LORazepam (ATIVAN) tablet 1 mg  1 mg Oral TID Antonieta Pert, MD       Followed by  . [START ON 06/10/2020] LORazepam (ATIVAN) tablet 1 mg  1 mg Oral BID Antonieta Pert, MD       Followed by  . [START ON 06/12/2020] LORazepam (ATIVAN) tablet 1 mg  1 mg Oral Daily Nea Gittens, Marlane Mingle, MD      . magnesium hydroxide (MILK OF MAGNESIA) suspension 30 mL  30 mL Oral Daily PRN Antonieta Pert, MD      . multivitamin with minerals tablet 1 tablet  1 tablet Oral Daily Jola Babinski, Marlane Mingle, MD      . nicotine (NICODERM CQ - dosed in mg/24 hours) patch 21 mg  21 mg Transdermal Daily Antonieta Pert, MD      . ondansetron (ZOFRAN-ODT) disintegrating tablet 4 mg  4 mg Oral Q6H PRN Antonieta Pert, MD      . pantoprazole (PROTONIX) EC tablet 40 mg  40 mg Oral Daily Antonieta Pert, MD      . thiamine tablet 100 mg  100 mg Oral Daily Antonieta Pert, MD        PTA Medications: Medications Prior to Admission  Medication Sig Dispense Refill Last Dose  . carbamazepine (TEGRETOL) 200 MG tablet  PO QD X 1D, then  QD X 1D, then  PO QD X 2D (Patient not taking: Reported on 06/07/2020) 6 tablet 0   . elvitegravir-cobicistat-emtricitabine-tenofovir (GENVOYA) 150-150-200-10 MG TABS tablet Take 1 tablet by mouth daily with breakfast. 30 tablet 1   . folic acid (FOLVITE) 1 MG tablet Take 1 tablet (1 mg total) by mouth daily. (Patient not taking: Reported on 06/07/2020) 30 tablet 2   . nicotine (NICODERM CQ - DOSED IN MG/24 HOURS) 21 mg/24hr patch Place 1 patch (21 mg total) onto the skin daily. (Patient not taking: Reported on 06/07/2020) 28 patch  0   . pantoprazole (PROTONIX) 40 MG tablet Take 1 tablet (40 mg total) by mouth daily. (Patient not taking: Reported on 06/07/2020) 30 tablet 1   . sucralfate (CARAFATE) 1 GM/10ML suspension Take 10 mLs (1 g total) by mouth 4 (four) times daily -  with meals and at bedtime for 14 days. (Patient not taking: Reported on 06/07/2020) 560 mL 0   . thiamine 100 MG tablet Take 1 tablet (100 mg total) by mouth daily. (Patient not taking: Reported on 06/07/2020) 30 tablet 2     Musculoskeletal: Strength & Muscle Tone: decreased Gait & Station: unsteady Patient leans: N/A  Psychiatric Specialty Exam: Physical Exam Vitals and nursing note reviewed.  Constitutional:      Appearance: He is ill-appearing.  Cardiovascular:     Rate and Rhythm: Regular rhythm. Tachycardia present.  Pulmonary:     Effort: Pulmonary effort is normal.  Musculoskeletal:     Cervical back: Normal range of motion.  Neurological:     Mental Status: He is alert.     Review of Systems  Constitutional: Positive for appetite change and fatigue.  HENT: Negative.   Eyes: Negative.   Respiratory: Negative.   Cardiovascular:       Withdrawing from Alcohol, heart rate 113.  On CIWA protocol  Gastrointestinal: Positive for nausea.  Skin:  Negative.   Neurological: Positive for tremors, weakness and light-headedness.  Hematological: Negative.   Psychiatric/Behavioral: The patient is nervous/anxious.     Blood pressure (!) 141/87, pulse (!) 113, temperature 97.9 F (36.6 C), temperature source Oral, resp. rate 18, height 6\' 1"  (1.854 m), weight 64 kg, SpO2 100 %.Body mass index is 18.6 kg/m.  General Appearance: Disheveled  Eye Contact:  Minimal  Speech:  Normal Rate  Volume:  Decreased  Mood:  Dysphoric  Affect:  Congruent  Thought Process:  Goal Directed and Descriptions of Associations: Circumstantial  Orientation:  Negative  Thought Content:  Negative  Suicidal Thoughts:  Yes.  without intent/plan  Homicidal Thoughts:  No  Memory:  Immediate;   Poor Recent;   Poor Remote;   Poor  Judgement:  Impaired  Insight:  Lacking  Psychomotor Activity:  Decreased  Concentration:  Concentration: Poor and Attention Span: Poor  Recall:  Poor  Fund of Knowledge:  Poor  Language:  Good  Akathisia:  Negative  Handed:  Right  AIMS (if indicated):     Assets:  Desire for Improvement Resilience  ADL's:  Impaired  Cognition:  WNL    Sleep:       Treatment Plan Summary: Daily contact with patient to assess and evaluate symptoms and progress in treatment and Medication management   Provide safety as patient is high risk for fall due to withdrawal symptoms Placed on .seisure and suicide precautions Alcohol detoxification protocol with CIWA score utilizing Ativan No Tylenol use due to abnormal liver function results. Offer Zofran for nausea and vomiting  Assist with ambulation- patient is a fall risk.  Observation Level/Precautions:  15 minute checks Seizure  Laboratory:  Am Labs - Ha1c, Lipid panel, TSH,   Psychotherapy:  Daily when ready  Medications:  See MAR  Consultations:  NA  Discharge Concerns:  Relapse  Estimated LOS:3-5 days  Other:     Physician Treatment Plan for Primary Diagnosis: <principal problem  not specified> Long Term Goal(s): Improvement in symptoms so as ready for discharge  Short Term Goals: Ability to identify changes in lifestyle to reduce recurrence of condition will improve, Ability to  verbalize feelings will improve, Ability to disclose and discuss suicidal ideas, Ability to demonstrate self-control will improve, Ability to identify and develop effective coping behaviors will improve, Ability to maintain clinical measurements within normal limits will improve, Compliance with prescribed medications will improve and Ability to identify triggers associated with substance abuse/mental health issues will improve  Physician Treatment Plan for Secondary Diagnosis: Active Problems:   Substance induced mood disorder (HCC)  Long Term Goal(s): Improvement in symptoms so as ready for discharge  Short Term Goals: Ability to identify changes in lifestyle to reduce recurrence of condition will improve, Ability to verbalize feelings will improve, Ability to disclose and discuss suicidal ideas, Ability to demonstrate self-control will improve, Ability to identify and develop effective coping behaviors will improve, Ability to maintain clinical measurements within normal limits will improve, Compliance with prescribed medications will improve and Ability to identify triggers associated with substance abuse/mental health issues will improve  I certify that inpatient services furnished can reasonably be expected to improve the patient's condition.    Earney Navy, NP 7/31/20214:47 PM   Case discussed and plan agreed upon as outlined above by nurse practitioner Gary [name].  Longstanding history of alcohol dependence and previous alcohol withdrawal syndrome.  Patient will be monitored closely, and if situation worsens will be transferred back to the emergency department.

## 2020-06-09 ENCOUNTER — Inpatient Hospital Stay (HOSPITAL_COMMUNITY): Admission: AD | Admit: 2020-06-09 | Payer: Self-pay | Source: Ambulatory Visit | Admitting: Internal Medicine

## 2020-06-09 DIAGNOSIS — F10239 Alcohol dependence with withdrawal, unspecified: Secondary | ICD-10-CM

## 2020-06-09 DIAGNOSIS — F10230 Alcohol dependence with withdrawal, uncomplicated: Secondary | ICD-10-CM

## 2020-06-09 LAB — CBC WITH DIFFERENTIAL/PLATELET
Abs Immature Granulocytes: 0.01 10*3/uL (ref 0.00–0.07)
Basophils Absolute: 0 10*3/uL (ref 0.0–0.1)
Basophils Relative: 1 %
Eosinophils Absolute: 0 10*3/uL (ref 0.0–0.5)
Eosinophils Relative: 1 %
HCT: 41.9 % (ref 39.0–52.0)
Hemoglobin: 14.5 g/dL (ref 13.0–17.0)
Immature Granulocytes: 0 %
Lymphocytes Relative: 21 %
Lymphs Abs: 0.8 10*3/uL (ref 0.7–4.0)
MCH: 33.9 pg (ref 26.0–34.0)
MCHC: 34.6 g/dL (ref 30.0–36.0)
MCV: 97.9 fL (ref 80.0–100.0)
Monocytes Absolute: 0.3 10*3/uL (ref 0.1–1.0)
Monocytes Relative: 7 %
Neutro Abs: 2.7 10*3/uL (ref 1.7–7.7)
Neutrophils Relative %: 70 %
Platelets: 90 10*3/uL — ABNORMAL LOW (ref 150–400)
RBC: 4.28 MIL/uL (ref 4.22–5.81)
RDW: 13.8 % (ref 11.5–15.5)
WBC: 3.8 10*3/uL — ABNORMAL LOW (ref 4.0–10.5)
nRBC: 0 % (ref 0.0–0.2)

## 2020-06-09 LAB — COMPREHENSIVE METABOLIC PANEL
ALT: 70 U/L — ABNORMAL HIGH (ref 0–44)
AST: 54 U/L — ABNORMAL HIGH (ref 15–41)
Albumin: 3.7 g/dL (ref 3.5–5.0)
Alkaline Phosphatase: 49 U/L (ref 38–126)
Anion gap: 14 (ref 5–15)
BUN: 6 mg/dL (ref 6–20)
CO2: 23 mmol/L (ref 22–32)
Calcium: 8.9 mg/dL (ref 8.9–10.3)
Chloride: 98 mmol/L (ref 98–111)
Creatinine, Ser: 0.56 mg/dL — ABNORMAL LOW (ref 0.61–1.24)
GFR calc Af Amer: >60 mL/min (ref >60)
GFR calc non Af Amer: >60 mL/min (ref >60)
Glucose, Bld: 88 mg/dL (ref 70–99)
Potassium: 3.4 mmol/L — ABNORMAL LOW (ref 3.5–5.1)
Sodium: 135 mmol/L (ref 135–145)
Total Bilirubin: 1.6 mg/dL — ABNORMAL HIGH (ref 0.3–1.2)
Total Protein: 6.6 g/dL (ref 6.5–8.1)

## 2020-06-09 LAB — LIPASE, BLOOD: Lipase: 2209 U/L — ABNORMAL HIGH (ref 11–51)

## 2020-06-09 MED ORDER — SODIUM CHLORIDE 0.9 % IV SOLN: INTRAVENOUS | Status: DC

## 2020-06-09 MED ORDER — THIAMINE HCL 100 MG PO TABS: 100.0000 mg | ORAL_TABLET | Freq: Every day | ORAL | Status: DC

## 2020-06-09 MED ORDER — FOLIC ACID 1 MG PO TABS: 1.0000 mg | ORAL_TABLET | Freq: Every day | ORAL | Status: DC

## 2020-06-09 MED ORDER — LORAZEPAM 1 MG PO TABS
0.0000 mg | ORAL_TABLET | Freq: Four times a day (QID) | ORAL | Status: DC
  Administered 2020-06-09: 1 mg via ORAL
  Filled 2020-06-09: qty 1

## 2020-06-09 MED ORDER — LORAZEPAM 2 MG/ML IJ SOLN
1.0000 mg | Freq: Once | INTRAMUSCULAR | Status: AC
  Administered 2020-06-09: 1 mg via INTRAVENOUS
  Filled 2020-06-09: qty 1

## 2020-06-09 MED ORDER — PANTOPRAZOLE SODIUM 40 MG IV SOLR: 40.0000 mg | INTRAVENOUS | Status: DC

## 2020-06-09 MED ORDER — ONDANSETRON HCL 4 MG/2ML IJ SOLN: 4.0000 mg | Freq: Four times a day (QID) | INTRAMUSCULAR | Status: DC | PRN

## 2020-06-09 MED ORDER — PROMETHAZINE HCL 25 MG/ML IJ SOLN: 6.2500 mg | Freq: Four times a day (QID) | INTRAMUSCULAR | Status: DC | PRN

## 2020-06-09 MED ORDER — ONDANSETRON HCL 4 MG PO TABS: 4.0000 mg | ORAL_TABLET | Freq: Three times a day (TID) | ORAL | Status: DC | PRN

## 2020-06-09 MED ORDER — ACETAMINOPHEN 650 MG RE SUPP: 650.0000 mg | Freq: Four times a day (QID) | RECTAL | Status: DC | PRN

## 2020-06-09 MED ORDER — LORAZEPAM 2 MG/ML IJ SOLN
2.0000 mg | Freq: Once | INTRAMUSCULAR | Status: AC
  Administered 2020-06-09: 2 mg via INTRAVENOUS
  Filled 2020-06-09: qty 1

## 2020-06-09 MED ORDER — LORAZEPAM 2 MG/ML IJ SOLN
0.0000 mg | Freq: Four times a day (QID) | INTRAMUSCULAR | Status: DC
  Administered 2020-06-09: 2 mg via INTRAVENOUS
  Filled 2020-06-09: qty 1

## 2020-06-09 MED ORDER — PANTOPRAZOLE SODIUM 40 MG IV SOLR
40.0000 mg | INTRAVENOUS | Status: DC
  Administered 2020-06-09: 40 mg via INTRAVENOUS
  Filled 2020-06-09: qty 40

## 2020-06-09 MED ORDER — THIAMINE HCL 100 MG/ML IJ SOLN
100.0000 mg | Freq: Every day | INTRAMUSCULAR | Status: DC
  Administered 2020-06-09: 100 mg via INTRAVENOUS
  Filled 2020-06-09: qty 2

## 2020-06-09 MED ORDER — ALUM & MAG HYDROXIDE-SIMETH 200-200-20 MG/5ML PO SUSP: 30.0000 mL | Freq: Four times a day (QID) | ORAL | Status: DC | PRN

## 2020-06-09 MED ORDER — LORAZEPAM 1 MG PO TABS: 0.0000 mg | ORAL_TABLET | Freq: Two times a day (BID) | ORAL | Status: DC

## 2020-06-09 MED ORDER — ADULT MULTIVITAMIN W/MINERALS CH
1.0000 | ORAL_TABLET | Freq: Every day | ORAL | Status: DC
  Administered 2020-06-09: 1 via ORAL

## 2020-06-09 MED ORDER — ACETAMINOPHEN 325 MG PO TABS: 650.0000 mg | ORAL_TABLET | Freq: Four times a day (QID) | ORAL | Status: DC | PRN

## 2020-06-09 MED ORDER — ACETAMINOPHEN 325 MG PO TABS: 650.0000 mg | ORAL_TABLET | ORAL | Status: DC | PRN

## 2020-06-09 MED ORDER — THIAMINE HCL 100 MG/ML IJ SOLN: 100.0000 mg | Freq: Every day | INTRAMUSCULAR | Status: DC

## 2020-06-09 MED ORDER — KCL IN DEXTROSE-NACL 10-5-0.45 MEQ/L-%-% IV SOLN
INTRAVENOUS | Status: DC
  Filled 2020-06-09: qty 1000

## 2020-06-09 MED ORDER — LORAZEPAM 2 MG/ML IJ SOLN: 0.0000 mg | Freq: Two times a day (BID) | INTRAMUSCULAR | Status: DC

## 2020-06-09 NOTE — ED Notes (Signed)
Patient vomiting, MD aware. Verbal order to give Ativan early

## 2020-06-09 NOTE — ED Notes (Signed)
Patient began vomiting, MD aware.

## 2020-06-09 NOTE — ED Notes (Signed)
Pt states he is feeling better and would like to leave.  MD made aware.

## 2020-06-09 NOTE — ED Provider Notes (Signed)
Patient left at change of shift, patient has history of alcoholism and was admitted at behavioral health for depression, suicidal ideation and alcohol withdrawal.  However his withdrawal symptoms were too severe for them to manage and he was sent back to the ED.  Patient has been getting IV fluids and IV Ativan.  When I rechecked him just now he states he still feeling "rough".  We will continue with the IV fluids and CIWA protocol.  Recheck at 5:20 AM patient is holding the emesis bag to his face, he does not look like he feels much better.  His CIWA score is 13 again.  His CBG is 86.  At this point I think patient needs to be medically admitted.  His blood work that was ordered around midnight was not seen so has not been done yet.  He does have blood work however from July 30.  Once it is resulted we will have the hospitalist admit him for medical detox for his alcoholism.  He also has a history of alcohol withdrawal seizures.  7:48 AM Hospitalist has not called back after 35 minutes, Dr Pilar Plate will inform about admission.  Diagnoses that have been ruled out:  None  Diagnoses that are still under consideration:  None  Final diagnoses:  Alcohol withdrawal syndrome without complication [facility])    Plan admission  Devoria Albe, MD, Concha Pyo, MD 06/09/20 [phone]

## 2020-06-09 NOTE — ED Provider Notes (Signed)
[facility]-EMERGENCY DEPT Provider Note   CSN: [id number] Arrival date & time: 06/08/20  2013     History Chief Complaint  Patient presents with  . Severe Detox    Gary [name] is a 34 y.o. male.  HPI     Patient sent to [place] in our emergency room from behavioral health [facility] for elevated CIWA score.  Patient has history of alcoholism.  He was transferred to behavioral health Hospital today from [facility] emergency room.  Patient reports that once he arrived there he started getting nauseated and threw up.  He also got diaphoretic.  His CIWA score was over 20 and patient was sent to the ER.  Patient is feeling slightly better now.  He feels "gassy".  Patient denies any diarrhea, abdominal pain that is new.  He reports heavy drinking and multiple episodes of withdrawals, including seizure.  Patient's last alcoholic beverage was yesterday. Past Medical History:  Diagnosis Date  . Alcohol abuse   . HIV (human immunodeficiency virus infection) (HCC)   . Subdural hematoma [facility])     Patient Active Problem List   Diagnosis Date Noted  . 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  . Malnutrition of moderate degree 05/07/2020  . Gastrointestinal hemorrhage   . Alcoholic hepatitis without ascites   . Melena 05/05/2020  . Fever 05/05/2020  . Alcoholic ketoacidosis 05/05/2020  . Alcohol withdrawal (HCC) 04/17/2020  . Thrombocytopenia (HCC) 07/25/2019  . Transaminitis 07/25/2019  . Diarrhea 07/25/2019  . Traumatic subdural hematoma (HCC) 07/02/2019  . Alcohol abuse with intoxication (HCC) 07/02/2019  . Alcohol abuse with alcohol-induced mood disorder (HCC) 05/13/2019  . Alcohol dependence (HCC) 04/16/2019  . 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 HIV disease (HCC) 06/16/2018  . Polysubstance abuse (HCC) 06/16/2018  . Cigarette smoker 06/16/2018    Past Surgical History:  Procedure Laterality  Date  . INCISION AND DRAINAGE PERIRECTAL ABSCESS N/A 09/24/2016   Procedure: IRRIGATION AND DEBRIDEMENT PERIRECTAL ABSCESS;  Surgeon: Ricarda Frame, MD;  Location: [facility] ORS;  Service: General;  Laterality: N/A;  . none         Family History  Problem Relation Age of Onset  . Alcohol abuse Mother   . Alcohol abuse Father     Social History   Tobacco Use  . Smoking status: Former Smoker    Packs/day: 0.50    Types: Cigarettes  . Smokeless tobacco: Never Used  . Tobacco comment: unable to smoke while incarcerated 6+ months 02/13/20  Vaping Use  . Vaping Use: Never used  Substance Use Topics  . Alcohol use: Yes    Alcohol/week: 3.0 standard drinks    Types: 3 Cans of beer per week    Comment: 24 oz  . Drug use: Not Currently    Types: Marijuana, Methamphetamines    Comment: last used 04/10/2019    Home Medications Prior to Admission medications   Medication Sig Start Date End Date Taking? Authorizing Provider  elvitegravir-cobicistat-emtricitabine-tenofovir (GENVOYA) 150-150-200-10 MG TABS tablet Take 1 tablet by mouth daily with breakfast. 05/18/20  Yes Linwood Dibbles, MD  carbamazepine (TEGRETOL) 200 MG tablet 600mg  PO QD X 1D, then 400mg  QD X 1D, then 200mg  PO QD X 2D Patient not taking: Reported on 06/07/2020 05/30/20   Albrizze, , PA-C  folic acid (FOLVITE) 1 MG tablet Take 1 tablet (1 mg total) by mouth daily. Patient not taking: Reported on 06/07/2020 05/09/20   Caroleen Hamman, MD  nicotine (  NICODERM CQ - DOSED IN MG/24 HOURS) 21 mg/24hr patch Place 1 patch (21 mg total) onto the skin daily. Patient not taking: Reported on 06/07/2020 05/09/20   Burnadette PopAdhikari, Amrit, MD  pantoprazole (PROTONIX) 40 MG tablet Take 1 tablet (40 mg total) by mouth daily. Patient not taking: Reported on 06/07/2020 05/09/20   Burnadette PopAdhikari, Amrit, MD  sucralfate (CARAFATE) 1 GM/10ML suspension Take 10 mLs (1 g total) by mouth 4 (four) times daily -  with meals and at bedtime for 14 days. Patient not taking:  Reported on 06/07/2020 05/09/20 06/07/20  Burnadette PopAdhikari, Amrit, MD  thiamine 100 MG tablet Take 1 tablet (100 mg total) by mouth daily. Patient not taking: Reported on 06/07/2020 05/09/20   Burnadette PopAdhikari, Amrit, MD  famotidine (PEPCID) 20 MG tablet Take 1 tablet (20 mg total) by mouth 2 (two) times daily. Patient not taking: Reported on 06/01/2019 05/14/19 06/02/19  Benjiman CorePickering, Nathan, MD    Allergies    Tegretol [carbamazepine] and Caffeine  Review of Systems   Review of Systems  Constitutional: Positive for activity change.  Respiratory: Negative for shortness of breath.   Cardiovascular: Negative for chest pain.  Gastrointestinal: Positive for abdominal pain, nausea and vomiting.  Neurological: Positive for dizziness.  All other systems reviewed and are negative.   Physical Exam Updated Vital Signs BP (!) 136/85   Pulse 99   Temp 99.4 F (37.4 C) (Oral)   Resp 15   Ht 6\' 1"  (1.854 m)   Wt 64 kg   SpO2 99%   BMI 18.60 kg/m   Physical Exam Vitals and nursing note reviewed.  Constitutional:      Appearance: He is well-developed.  HENT:     Head: Atraumatic.  Eyes:     Extraocular Movements: Extraocular movements intact.     Pupils: Pupils are equal, round, and reactive to light.  Cardiovascular:     Rate and Rhythm: Normal rate.  Pulmonary:     Effort: Pulmonary effort is normal.  Abdominal:     General: There is distension.     Tenderness: There is abdominal tenderness. There is no guarding or rebound.  Musculoskeletal:     Cervical back: Neck supple.  Skin:    General: Skin is warm.  Neurological:     Mental Status: He is alert and oriented to person, place, and time.     Comments: Tremor noted upon extension      ED Results / Procedures / Treatments   Labs (all labs ordered are listed, but only abnormal results are displayed) Labs Reviewed  GLUCOSE, CAPILLARY - Abnormal; Notable for the following components:      Result Value   Glucose-Capillary 100 (*)    All other  components within normal limits  COMPREHENSIVE METABOLIC PANEL  CBC WITH DIFFERENTIAL/PLATELET  LIPASE, BLOOD    EKG None  Radiology No results found.  Procedures .Critical Care Performed by: Derwood KaplanNanavati, Keyanna Sandefer, MD Authorized by: Derwood KaplanNanavati, Malya Cirillo, MD   Critical care provider statement:    Critical care time (minutes):  38   Critical care was necessary to treat or prevent imminent or life-threatening deterioration of the following conditions:  Metabolic crisis   Critical care was time spent personally by me on the following activities:  Discussions with consultants, evaluation of patient's response to treatment, examination of patient, ordering and performing treatments and interventions, ordering and review of laboratory studies, ordering and review of radiographic studies, pulse oximetry, re-evaluation of patient's condition, obtaining history from patient or surrogate and review of  old charts   (including critical care time)  Medications Ordered in ED Medications  hydrOXYzine (ATARAX/VISTARIL) tablet 25 mg (has no administration in time range)  loperamide (IMODIUM) capsule 2-4 mg (has no administration in time range)  LORazepam (ATIVAN) tablet 1 mg (has no administration in time range)  LORazepam (ATIVAN) tablet 1 mg (1 mg Oral Given 06/08/20 2352)    Followed by  LORazepam (ATIVAN) tablet 1 mg (has no administration in time range)    Followed by  LORazepam (ATIVAN) tablet 1 mg (has no administration in time range)    Followed by  LORazepam (ATIVAN) tablet 1 mg (has no administration in time range)  multivitamin with minerals tablet 1 tablet (1 tablet Oral Not Given 06/08/20 1729)  ondansetron (ZOFRAN-ODT) disintegrating tablet 4 mg (4 mg Oral Given 06/08/20 1730)  alum & mag hydroxide-simeth (MAALOX/MYLANTA) 200-200-20 MG/5ML suspension 30 mL (has no administration in time range)  magnesium hydroxide (MILK OF MAGNESIA) suspension 30 mL (has no administration in time range)    elvitegravir-cobicistat-emtricitabine-tenofovir (GENVOYA) 150-150-200-10 MG tablet 1 tablet (has no administration in time range)  folic acid (FOLVITE) tablet 1 mg (1 mg Oral Given 06/08/20 1737)  nicotine (NICODERM CQ - dosed in mg/24 hours) patch 21 mg (21 mg Transdermal Refused 06/08/20 2040)  pantoprazole (PROTONIX) EC tablet 40 mg (40 mg Oral Given 06/08/20 1730)  thiamine tablet 100 mg (100 mg Oral Not Given 06/08/20 1729)  LORazepam (ATIVAN) injection 0-4 mg (has no administration in time range)    Or  LORazepam (ATIVAN) tablet 0-4 mg (has no administration in time range)  LORazepam (ATIVAN) injection 0-4 mg (has no administration in time range)    Or  LORazepam (ATIVAN) tablet 0-4 mg (has no administration in time range)  thiamine tablet 100 mg (has no administration in time range)    Or  thiamine (B-1) injection 100 mg (has no administration in time range)  acetaminophen (TYLENOL) tablet 650 mg (has no administration in time range)  alum & mag hydroxide-simeth (MAALOX/MYLANTA) 200-200-20 MG/5ML suspension 30 mL (has no administration in time range)  ondansetron (ZOFRAN) tablet 4 mg (has no administration in time range)  LORazepam (ATIVAN) injection 2 mg (has no administration in time range)  LORazepam (ATIVAN) injection 2 mg (2 mg Intravenous Given 06/08/20 2040)  chlordiazePOXIDE (LIBRIUM) capsule 25 mg (25 mg Oral Given 06/08/20 2353)    ED Course  I have reviewed the triage vital signs and the nursing notes.  Pertinent labs & imaging results that were available during my care of the patient were reviewed by me and considered in my medical decision making (see chart for details).    MDM Rules/Calculators/A&P                          34 year old comes in a chief complaint of alcohol withdrawals.  Patient is having nausea, vomiting, diaphoresis.  He was sent here from behavioral health Hospital.  He had lab work-up done at [facility] emergency room which was reassuring.  On my  exam patient has chronic appearing abdominal tenderness without any peritoneal findings.  It appears that he will need some benzodiazepine for his alcohol withdrawal.  We will hydrate him.  Patient was reassessed by me at midnight.  He has failed oral challenge.  His care will be signed out to Dr. Lynelle Doctor.  I will ordered basic labs.  If patient fails oral challenge then he might need repeat abdominal exam and CT scan if  needed.  Patient will also need admission if his symptoms get worse.  Final Clinical Impression(s) / ED Diagnoses Final diagnoses:  Alcohol withdrawal syndrome without complication [facility])    Rx / DC Orders ED Discharge Orders    None       Derwood Kaplan, MD 06/09/20 [phone]

## 2020-06-09 NOTE — ED Notes (Signed)
Patient refusing PO meds at this time. Has had two episodes of active vomiting this shift. Patient ambulated to bathroom at 0954.

## 2020-06-09 NOTE — ED Provider Notes (Addendum)
  Provider Note MRN:  [id number]  Arrival date & time: 06/09/20    ED Course and Medical Decision Making  Assumed care from Dr. Lynelle Doctor at shift change.  Patient is being cared for at the behavioral health hospital for depression and is also withdrawing from alcohol.  Has been sent from behavioral health to the emergency department twice for withdrawal and elevated CIWA scores, currently with CIWA score of 13, needs medical admission for withdrawal.  Accepted for admission by hospitalist service.  1:58 PM update: Patient feels better and wants to go home.  He is not under IVC, he denies any suicidal or homicidal ideation, he seems reasonable on exam.  I personally contracted him for safety, and he agrees to return to the emergency department prior to acting on any thoughts of self-harm or the harm of others.  Regarding his withdrawal, I informed him of the risks of leaving, including worsening withdrawal, seizure, death.  Also informed him that if he leaves in starts drinking again, this will only set him back.  He is adamant on leaving and slowly cutting back on his alcohol on his own.  Patient leaving AGAINST MEDICAL ADVICE.  Procedures  Final Clinical Impressions(s) / ED Diagnoses     ICD-10-CM   1. Alcohol withdrawal syndrome without complication [facility])  F10.230     ED Discharge Orders    None      Discharge Instructions   None     Gary Sow. Pilar Plate, MD [facility] Emergency Medicine [facility] [email or web address]    Sabas Sous, MD 06/09/20 [id number]    Sabas Sous, MD 06/09/20 1359

## 2020-06-09 NOTE — ED Notes (Signed)
Pt is requesting to leave despite talking with MDs about possible risk of leaving.  Pt verbalized understanding. IV removed. Pt's belongings returned to pt.

## 2020-06-09 NOTE — ED Notes (Signed)
Pt denies SI to RN and to Dr. Pilar Plate.  Pt is wanting to leave a 15:00.

## 2020-06-09 NOTE — H&P (Signed)
History and Physical    [name] QJJ:[id number] DOB: 1986/04/18 DOA: (Not on file)  PCP: Patient, No Pcp Per   Patient coming from: [facility]  Chief Complaint; Vomiting    HPI: Gary [name] is a 34 y.o. male with medical history significant for severe alcohol abuse, HIV, mood disorder due to alcohol abuse was admitted to behavioral health on 7/31 for alcohol detox.  He normally drinks 2/5 of vodka, 5-6 bottles of wine and 40 ox beer daily.  Is homeless in [place] and has no job has had poor sleep appetite and lost 30 pounds in 2 months.  He denies any suicidal ideation.   He was seen in the ED and was noted to have lab LFTs 30 from alcohol, and was admitted at behavioral health however he continued to have nausea vomiting and high CIWA score for which he was transferred to the ED for detox.  ED Course: Mildly tachycardic in low 100, feels somewhat better but still having nausea, has been getting Ativan in the ED.  Continue suicidal ideation, reports mid abdominal cramping across the abdomen due to his vomiting otherwise no right upper quadrant abdominal pain. Patient otherwise denies any nausea, vomiting, chest pain, shortness of breath, fever, chills, headache, focal weakness, numbness tingling, speech difficulties. Patient remains behaviorally inpatient status in the ED and admission was requested.  Unable to put any orders at this time and new encounters has been created for inpatient and orders will be released once he arrives to floor.  : There were no vitals filed for this visit.   Review of Systems: All systems were reviewed and were negative except as mentioned in HPI above. Negative for fever Negative for chest pain Negative for shortness of breath  Past Medical History:  Diagnosis Date  . Alcohol abuse   . HIV (human immunodeficiency virus infection) (HCC)   . Subdural hematoma [facility])     Past Surgical History:  Procedure Laterality Date  . INCISION AND DRAINAGE  PERIRECTAL ABSCESS N/A 09/24/2016   Procedure: IRRIGATION AND DEBRIDEMENT PERIRECTAL ABSCESS;  Surgeon: Ricarda Frame, MD;  Location: [facility] ORS;  Service: General;  Laterality: N/A;  . none       reports that he has quit smoking. His smoking use included cigarettes. He smoked 0.50 packs per day. He has never used smokeless tobacco. He reports current alcohol use of about 3.0 standard drinks of alcohol per week. He reports previous drug use. Drugs: Marijuana and Methamphetamines.  Allergies  Allergen Reactions  . Tegretol [Carbamazepine] Other (See Comments)    Caused vertigo for 2 days after taking it  . Caffeine Palpitations    Family History  Problem Relation Age of Onset  . Alcohol abuse Mother   . Alcohol abuse Father      Prior to Admission medications   Medication Sig Start Date End Date Taking? Authorizing Provider  carbamazepine (TEGRETOL) 200 MG tablet 600mg  PO QD X 1D, then 400mg  QD X 1D, then 200mg  PO QD X 2D Patient not taking: Reported on 06/07/2020 05/30/20   Albrizze, , PA-C  elvitegravir-cobicistat-emtricitabine-tenofovir (GENVOYA) 150-150-200-10 MG TABS tablet Take 1 tablet by mouth daily with breakfast. 05/18/20   06/01/20, MD  folic acid (FOLVITE) 1 MG tablet Take 1 tablet (1 mg total) by mouth daily. Patient not taking: Reported on 06/07/2020 05/09/20   Linwood Dibbles, MD  nicotine (NICODERM CQ - DOSED IN MG/24 HOURS) 21 mg/24hr patch Place 1 patch (21 mg total) onto the skin daily.  Patient not taking: Reported on 06/07/2020 05/09/20   Burnadette Pop, MD  pantoprazole (PROTONIX) 40 MG tablet Take 1 tablet (40 mg total) by mouth daily. Patient not taking: Reported on 06/07/2020 05/09/20   Burnadette Pop, MD  sucralfate (CARAFATE) 1 GM/10ML suspension Take 10 mLs (1 g total) by mouth 4 (four) times daily -  with meals and at bedtime for 14 days. Patient not taking: Reported on 06/07/2020 05/09/20 06/07/20  Burnadette Pop, MD  thiamine 100 MG tablet Take 1 tablet  (100 mg total) by mouth daily. Patient not taking: Reported on 06/07/2020 05/09/20   Burnadette Pop, MD  famotidine (PEPCID) 20 MG tablet Take 1 tablet (20 mg total) by mouth 2 (two) times daily. Patient not taking: Reported on 06/01/2019 05/14/19 06/02/19  Benjiman Core, MD    Physical Exam: There were no vitals filed for this visit.  General exam: AAOx3, in mild distress, NAD, weak appearing. HEENT:Oral mucosa moist, Ear/Nose WNL grossly, dentition normal. Respiratory system: bilaterally clear,no wheezing or crackles,no use of accessory muscle Cardiovascular system: S1 & S2 +, No JVD,. Gastrointestinal system: Abdomen soft, mild mid abdomen tenderness/soreness, ND, BS+ Nervous System:Alert, awake, moving extremities and grossly nonfocal Extremities: No edema, distal peripheral pulses palpable.  Skin: No rashes,no icterus. MSK: Normal muscle bulk,tone, power   Labs on Admission: I have personally reviewed following labs and imaging studies  CBC: Recent Labs  Lab 06/07/20 1230 06/09/20 0538  WBC 4.5 3.8*  NEUTROABS  --  2.7  HGB 15.0 14.5  HCT 45.1 41.9  MCV 99.1 97.9  PLT 165 90*   Basic Metabolic Panel: Recent Labs  Lab 06/07/20 1230 06/09/20 0538  NA 139 135  K 3.9 3.4*  CL 102 98  CO2 18* 23  GLUCOSE 250* 88  BUN 13 6  CREATININE 1.06 0.56*  CALCIUM 9.0 8.9   GFR: Estimated Creatinine Clearance: 126.3 mL/min (A) (by C-G formula based on SCr of 0.56 mg/dL (L)). Liver Function Tests: Recent Labs  Lab 06/07/20 1230 06/09/20 0538  AST 134* 54*  ALT 134* 70*  ALKPHOS 73 49  BILITOT 1.0 1.6*  PROT 7.6 6.6  ALBUMIN 4.2 3.7   No results for input(s): LIPASE, AMYLASE in the last 168 hours. No results for input(s): AMMONIA in the last 168 hours. Coagulation Profile: No results for input(s): INR, PROTIME in the last 168 hours. Cardiac Enzymes: No results for input(s): CKTOTAL, CKMB, CKMBINDEX, TROPONINI in the last 168 hours. BNP (last 3 results) No results  for input(s): PROBNP in the last 8760 hours. HbA1C: No results for input(s): HGBA1C in the last 72 hours. CBG: Recent Labs  Lab 06/08/20 1836  GLUCAP 100*   Lipid Profile: No results for input(s): CHOL, HDL, LDLCALC, TRIG, CHOLHDL, LDLDIRECT in the last 72 hours. Thyroid Function Tests: No results for input(s): TSH, T4TOTAL, FREET4, T3FREE, THYROIDAB in the last 72 hours. Anemia Panel: No results for input(s): VITAMINB12, FOLATE, FERRITIN, TIBC, IRON, RETICCTPCT in the last 72 hours. Urine analysis:    Component Value Date/Time   COLORURINE STRAW (A) 05/15/2020 1824   APPEARANCEUR CLEAR 05/15/2020 1824   APPEARANCEUR Clear 11/15/2014 1755   LABSPEC 1.003 (L) 05/15/2020 1824   LABSPEC 1.002 11/15/2014 1755   PHURINE 6.0 05/15/2020 1824   GLUCOSEU NEGATIVE 05/15/2020 1824   GLUCOSEU Negative 11/15/2014 1755   HGBUR NEGATIVE 05/15/2020 1824   BILIRUBINUR NEGATIVE 05/15/2020 1824   BILIRUBINUR Negative 11/15/2014 1755   KETONESUR NEGATIVE 05/15/2020 1824   PROTEINUR NEGATIVE 05/15/2020 1824  NITRITE NEGATIVE 05/15/2020 1824   LEUKOCYTESUR NEGATIVE 05/15/2020 1824   LEUKOCYTESUR Negative 11/15/2014 1755    Radiological Exams on Admission: No results found.   Assessment/Plan Principal Problem: Severe alcohol abuse with alcohol withdrawal, last alcohol use 7/30: Patient was admitted continue on CIWA scale Ativan, reassess for need for scheduled benzo,currently getting binge in the ED.  Intractable nausea vomiting suspect combination of alcohol withdrawal, question gastritis added Protonix, maalox.  Continue antinausea medication Zofran and Phenergan for refractory nausea.  Hypokalemia - we will add potassium and IV fluids.  HIV disease: Resume home medication  Leukopenia/thrombocytopenia, severe alcohol abuse.  Monitor labs.  Avoid heparin/Lovenox  Transaminitis, suspected due to his alcohol abuse.  LFTs seem to be downtrending compared to his blood work couple of days  ago  Addendum 2 pm Patient was feeling better and was wanting to leave. I spoke w/ Dr Pilar Plate- he has also assessed the patient- he had contract for safety and denies any suicidal ideation homicidal ideation. I informed him that my recommendation is to for him to stay here and go through complete detox.  Patient reports he is feeling better he will start to cut down alcohol slowly.  He wants to go home today.I informed him risks of leaving AGAINST MEDICAL ADVICE including seizure death/seizure-he verbalized understanding and is wanting to leave. Patient is leaving AGAINST MEDICAL ADVICE from the ED prior to getting admitted to the floor  There is no height or weight on file to calculate BMI.   Severity of Illness: * I certify that at the point of admission it is my clinical judgment that the patient will require inpatient hospital care spanning beyond 2 midnights from the point of admission due to high intensity of service, high risk for further deterioration and high frequency of surveillance required.*    DVT prophylaxis:  scd. no chemical prophylaxis due to thrombocytopenia.  Code Status:   Code Status: Prior  Family Communication: Admission, patients condition and plan of care including tests being ordered have been discussed with the patient  who indicate understanding and agree with the plan and Code Status.  Consults called:   Lanae Boast MD [facility]  If 7PM-7AM, please contact night-coverage [email or web address]  06/09/2020, 8:49 AM

## 2020-06-09 NOTE — Discharge Summary (Signed)
Physician Discharge Summary  GLENN GULLICKSON XTG:[id number] DOB: 1986-07-15 DOA: (Not on file)  PCP: Patient, No Pcp Per  Admit date: (Not on file) Discharge date: 06/09/2020  Admitted From: ED Disposition:  AMA  Recommendations for Outpatient Follow-up:  Follow up with PCP in  1 DAY Home Health:NO  Equipment/Devices: NONE  Discharge Condition: ama, aaox4, stable Code Status: DULL Diet recommendation:  Diet Order     None        Brief/Interim Summary: 34 year old male admitted from behavioral health to the ED for detox. Patient was admitted to behavioral health for detox,however patient was persistently vomiting so he was sent to the ED.Medical admission was requested in the ED.Patient was being planned for admission and he was being hydrated and continued on CIWA Ativan.Patient reports he was feeling better,was AAOX4. He left AMA from ED. Please see the previous notes for more details.  Discharge Diagnoses:  Principal Problem:   Alcohol withdrawal (HCC) Active Problems:   HIV disease (HCC)   Alcohol dependence (HCC)   Thrombocytopenia (HCC)   Transaminitis   Discharge Exam: General: Pt is alert, awake,oriented x4, not in acute distress. Cardiovascular: RRR, S1/S2 +, no rubs, no gallops. Respiratory: CTA bilaterally, no wheezing, no rhonchi. Abdominal: Soft, NT, ND, bowel sounds +. Extremities: no edema, no cyanosis.  Discharge Instructions Allergies as of 06/09/2020       Reactions   Tegretol [carbamazepine] Other (See Comments)   Caused vertigo for 2 days after taking it   Caffeine Palpitations        Medication List     Notice   Cannot display discharge medications because the patient has not yet been admitted.      Allergies  Allergen Reactions   Tegretol [Carbamazepine] Other (See Comments)    Caused vertigo for 2 days after taking it   Caffeine Palpitations    The results of significant diagnostics from this hospitalization (including imaging,  microbiology, ancillary and laboratory) are listed below for reference.    Microbiology: Recent Results (from the past 240 hour(s))  SARS Coronavirus 2 by RT PCR (hospital order, performed in [facility] hospital lab) Nasopharyngeal Nasopharyngeal Swab     Status: None   Collection Time: 06/07/20  9:03 PM   Specimen: Nasopharyngeal Swab  Result Value Ref Range Status   SARS Coronavirus 2 NEGATIVE NEGATIVE Final    Comment: (NOTE) SARS-CoV-2 target nucleic acids are NOT DETECTED.  The SARS-CoV-2 RNA is generally detectable in upper and lower respiratory specimens during the acute phase of infection. The lowest concentration of SARS-CoV-2 viral copies this assay can detect is 250 copies / mL. A negative result does not preclude SARS-CoV-2 infection and should not be used as the sole basis for treatment or other patient management decisions.  A negative result may occur with improper specimen collection / handling, submission of specimen other than nasopharyngeal swab, presence of viral mutation(s) within the areas targeted by this assay, and inadequate number of viral copies (<250 copies / mL). A negative result must be combined with clinical observations, patient history, and epidemiological information.  Fact Sheet for Patients:   BoilerBrush.com.cy  Fact Sheet for Healthcare Providers: https://pope.com/  This test is not yet approved or  cleared by the [place] FDA and has been authorized for detection and/or diagnosis of SARS-CoV-2 by FDA under an Emergency Use Authorization (EUA).  This EUA will remain in effect (meaning this test can be used) for the duration of the COVID-19 declaration under Section 564(b)(1) of  the Act, 21 U.S.C. section 360bbb-3(b)(1), unless the authorization is terminated or revoked sooner.  Performed at [facility], [address] [place], [place] [address]     Procedures/Studies: DG Knee  Complete 4 Views Right  Result Date: 05/18/2020 CLINICAL DATA:  Pain following fall EXAM: RIGHT KNEE - COMPLETE 4+ VIEW COMPARISON:  None. FINDINGS: Frontal, lateral, and bilateral oblique views were obtained. There is no appreciable fracture or dislocation. There is a moderate joint effusion. There is mild narrowing medially in the patellofemoral joint region. There is no erosive change. There is mild spurring along the anterior superior patella. IMPRESSION: Moderate joint effusion. No fracture or dislocation. Mild narrowing of the medial compartment and patellofemoral joints. Mild spurring along the anterior superior patella likely represents distal quadriceps tendinosis. Electronically Signed   By: Bretta Bang III M.D.   On: 05/18/2020 15:27     Labs: BNP (last 3 results) No results for input(s): BNP in the last 8760 hours. Basic Metabolic Panel: Recent Labs  Lab 06/07/20 1230 06/09/20 0538  NA 139 135  K 3.9 3.4*  CL 102 98  CO2 18* 23  GLUCOSE 250* 88  BUN 13 6  CREATININE 1.06 0.56*  CALCIUM 9.0 8.9   Liver Function Tests: Recent Labs  Lab 06/07/20 1230 06/09/20 0538  AST 134* 54*  ALT 134* 70*  ALKPHOS 73 49  BILITOT 1.0 1.6*  PROT 7.6 6.6  ALBUMIN 4.2 3.7   Recent Labs  Lab 06/09/20 0538  LIPASE 2,209*   No results for input(s): AMMONIA in the last 168 hours. CBC: Recent Labs  Lab 06/07/20 1230 06/09/20 0538  WBC 4.5 3.8*  NEUTROABS  --  2.7  HGB 15.0 14.5  HCT 45.1 41.9  MCV 99.1 97.9  PLT 165 90*   Cardiac Enzymes: No results for input(s): CKTOTAL, CKMB, CKMBINDEX, TROPONINI in the last 168 hours. BNP: Invalid input(s): POCBNP CBG: Recent Labs  Lab 06/08/20 1836  GLUCAP 100*   D-Dimer No results for input(s): DDIMER in the last 72 hours. Hgb A1c No results for input(s): HGBA1C in the last 72 hours. Lipid Profile No results for input(s): CHOL, HDL, LDLCALC, TRIG, CHOLHDL, LDLDIRECT in the last 72 hours. Thyroid function studies No  results for input(s): TSH, T4TOTAL, T3FREE, THYROIDAB in the last 72 hours.  Invalid input(s): FREET3 Anemia work up No results for input(s): VITAMINB12, FOLATE, FERRITIN, TIBC, IRON, RETICCTPCT in the last 72 hours. Urinalysis    Component Value Date/Time   COLORURINE STRAW (A) 05/15/2020 1824   APPEARANCEUR CLEAR 05/15/2020 1824   APPEARANCEUR Clear 11/15/2014 1755   LABSPEC 1.003 (L) 05/15/2020 1824   LABSPEC 1.002 11/15/2014 1755   PHURINE 6.0 05/15/2020 1824   GLUCOSEU NEGATIVE 05/15/2020 1824   GLUCOSEU Negative 11/15/2014 1755   HGBUR NEGATIVE 05/15/2020 1824   BILIRUBINUR NEGATIVE 05/15/2020 1824   BILIRUBINUR Negative 11/15/2014 1755   KETONESUR NEGATIVE 05/15/2020 1824   PROTEINUR NEGATIVE 05/15/2020 1824   NITRITE NEGATIVE 05/15/2020 1824   LEUKOCYTESUR NEGATIVE 05/15/2020 1824   LEUKOCYTESUR Negative 11/15/2014 1755   Sepsis Labs Invalid input(s): PROCALCITONIN,  WBC,  LACTICIDVEN Microbiology Recent Results (from the past 240 hour(s))  SARS Coronavirus 2 by RT PCR (hospital order, performed in [facility] hospital lab) Nasopharyngeal Nasopharyngeal Swab     Status: None   Collection Time: 06/07/20  9:03 PM   Specimen: Nasopharyngeal Swab  Result Value Ref Range Status   SARS Coronavirus 2 NEGATIVE NEGATIVE Final    Comment: (NOTE) SARS-CoV-2  target nucleic acids are NOT DETECTED.  The SARS-CoV-2 RNA is generally detectable in upper and lower respiratory specimens during the acute phase of infection. The lowest concentration of SARS-CoV-2 viral copies this assay can detect is 250 copies / mL. A negative result does not preclude SARS-CoV-2 infection and should not be used as the sole basis for treatment or other patient management decisions.  A negative result may occur with improper specimen collection / handling, submission of specimen other than nasopharyngeal swab, presence of viral mutation(s) within the areas targeted by this assay, and inadequate number  of viral copies (<250 copies / mL). A negative result must be combined with clinical observations, patient history, and epidemiological information.  Fact Sheet for Patients:   BoilerBrush.com.cy  Fact Sheet for Healthcare Providers: https://pope.com/  This test is not yet approved or  cleared by the [place] FDA and has been authorized for detection and/or diagnosis of SARS-CoV-2 by FDA under an Emergency Use Authorization (EUA).  This EUA will remain in effect (meaning this test can be used) for the duration of the COVID-19 declaration under Section 564(b)(1) of the Act, 21 U.S.C. section 360bbb-3(b)(1), unless the authorization is terminated or revoked sooner.  Performed at [facility], [address] [place], [place] [address]      Time coordinating discharge: 0  minutes  SIGNED: Lanae Boast, MD  [facility] 06/09/2020, 4:05 PM  If 7PM-7AM, please contact night-coverage [email or web address]

## 2020-06-12 LAB — CBG MONITORING, ED: Glucose-Capillary: 86 mg/dL (ref 70–99)

## 2020-06-13 ENCOUNTER — Emergency Department (HOSPITAL_COMMUNITY): Payer: Self-pay

## 2020-06-13 ENCOUNTER — Inpatient Hospital Stay (HOSPITAL_COMMUNITY)
Admission: EM | Admit: 2020-06-13 | Discharge: 2020-06-18 | DRG: 439 | Disposition: A | Discharge status: Home / Self Care | Payer: Self-pay | Attending: Family Medicine | Admitting: Family Medicine

## 2020-06-13 ENCOUNTER — Other Ambulatory Visit: Payer: Self-pay

## 2020-06-13 ENCOUNTER — Encounter (HOSPITAL_COMMUNITY): Payer: Self-pay | Admitting: Emergency Medicine

## 2020-06-13 DIAGNOSIS — K760 Fatty (change of) liver, not elsewhere classified: Secondary | ICD-10-CM | POA: Diagnosis present

## 2020-06-13 DIAGNOSIS — F1094 Alcohol use, unspecified with alcohol-induced mood disorder: Secondary | ICD-10-CM | POA: Diagnosis present

## 2020-06-13 DIAGNOSIS — K852 Alcohol induced acute pancreatitis without necrosis or infection: Principal | ICD-10-CM | POA: Diagnosis present

## 2020-06-13 DIAGNOSIS — K7010 Alcoholic hepatitis without ascites: Secondary | ICD-10-CM | POA: Diagnosis present

## 2020-06-13 DIAGNOSIS — F10239 Alcohol dependence with withdrawal, unspecified: Secondary | ICD-10-CM | POA: Diagnosis present

## 2020-06-13 DIAGNOSIS — F1014 Alcohol abuse with alcohol-induced mood disorder: Secondary | ICD-10-CM | POA: Diagnosis present

## 2020-06-13 DIAGNOSIS — F1020 Alcohol dependence, uncomplicated: Secondary | ICD-10-CM | POA: Diagnosis present

## 2020-06-13 DIAGNOSIS — Z590 Homelessness: Secondary | ICD-10-CM

## 2020-06-13 DIAGNOSIS — Z87891 Personal history of nicotine dependence: Secondary | ICD-10-CM

## 2020-06-13 DIAGNOSIS — F332 Major depressive disorder, recurrent severe without psychotic features: Secondary | ICD-10-CM | POA: Diagnosis present

## 2020-06-13 DIAGNOSIS — Z811 Family history of alcohol abuse and dependence: Secondary | ICD-10-CM

## 2020-06-13 DIAGNOSIS — Z888 Allergy status to other drugs, medicaments and biological substances status: Secondary | ICD-10-CM

## 2020-06-13 DIAGNOSIS — R45851 Suicidal ideations: Secondary | ICD-10-CM | POA: Diagnosis present

## 2020-06-13 DIAGNOSIS — E876 Hypokalemia: Secondary | ICD-10-CM | POA: Diagnosis present

## 2020-06-13 DIAGNOSIS — F1024 Alcohol dependence with alcohol-induced mood disorder: Secondary | ICD-10-CM | POA: Diagnosis present

## 2020-06-13 DIAGNOSIS — Y900 Blood alcohol level of less than 20 mg/100 ml: Secondary | ICD-10-CM | POA: Diagnosis present

## 2020-06-13 DIAGNOSIS — Z79899 Other long term (current) drug therapy: Secondary | ICD-10-CM

## 2020-06-13 DIAGNOSIS — F1590 Other stimulant use, unspecified, uncomplicated: Secondary | ICD-10-CM | POA: Diagnosis present

## 2020-06-13 DIAGNOSIS — Z21 Asymptomatic human immunodeficiency virus [HIV] infection status: Secondary | ICD-10-CM | POA: Diagnosis present

## 2020-06-13 DIAGNOSIS — E162 Hypoglycemia, unspecified: Secondary | ICD-10-CM | POA: Diagnosis present

## 2020-06-13 DIAGNOSIS — K859 Acute pancreatitis without necrosis or infection, unspecified: Secondary | ICD-10-CM | POA: Diagnosis present

## 2020-06-13 DIAGNOSIS — K2920 Alcoholic gastritis without bleeding: Secondary | ICD-10-CM | POA: Diagnosis present

## 2020-06-13 DIAGNOSIS — B20 Human immunodeficiency virus [HIV] disease: Secondary | ICD-10-CM | POA: Diagnosis present

## 2020-06-13 DIAGNOSIS — Z20822 Contact with and (suspected) exposure to covid-19: Secondary | ICD-10-CM | POA: Diagnosis present

## 2020-06-13 DIAGNOSIS — R7401 Elevation of levels of liver transaminase levels: Secondary | ICD-10-CM | POA: Diagnosis present

## 2020-06-13 LAB — CBC
HCT: 35.8 % — ABNORMAL LOW (ref 39.0–52.0)
Hemoglobin: 12.5 g/dL — ABNORMAL LOW (ref 13.0–17.0)
MCH: 33.8 pg (ref 26.0–34.0)
MCHC: 34.9 g/dL (ref 30.0–36.0)
MCV: 96.8 fL (ref 80.0–100.0)
Platelets: 159 10*3/uL (ref 150–400)
RBC: 3.7 MIL/uL — ABNORMAL LOW (ref 4.22–5.81)
RDW: 13.7 % (ref 11.5–15.5)
WBC: 3.8 10*3/uL — ABNORMAL LOW (ref 4.0–10.5)
nRBC: 0 % (ref 0.0–0.2)

## 2020-06-13 LAB — HEPATIC FUNCTION PANEL
ALT: 100 U/L — ABNORMAL HIGH (ref 0–44)
AST: 157 U/L — ABNORMAL HIGH (ref 15–41)
Albumin: 3.9 g/dL (ref 3.5–5.0)
Alkaline Phosphatase: 49 U/L (ref 38–126)
Bilirubin, Direct: 0.2 mg/dL (ref 0.0–0.2)
Indirect Bilirubin: 0.5 mg/dL (ref 0.3–0.9)
Total Bilirubin: 0.7 mg/dL (ref 0.3–1.2)
Total Protein: 7.2 g/dL (ref 6.5–8.1)

## 2020-06-13 LAB — BASIC METABOLIC PANEL
Anion gap: 12 (ref 5–15)
BUN: <5 mg/dL — ABNORMAL LOW (ref 6–20)
CO2: 24 mmol/L (ref 22–32)
Calcium: 8.5 mg/dL — ABNORMAL LOW (ref 8.9–10.3)
Chloride: 101 mmol/L (ref 98–111)
Creatinine, Ser: 0.64 mg/dL (ref 0.61–1.24)
GFR calc Af Amer: >60 mL/min (ref >60)
GFR calc non Af Amer: >60 mL/min (ref >60)
Glucose, Bld: 130 mg/dL — ABNORMAL HIGH (ref 70–99)
Potassium: 2.7 mmol/L — CL (ref 3.5–5.1)
Sodium: 137 mmol/L (ref 135–145)

## 2020-06-13 LAB — ETHANOL: Alcohol, Ethyl (B): 293 mg/dL — ABNORMAL HIGH (ref <10)

## 2020-06-13 LAB — LIPASE, BLOOD: Lipase: 2004 U/L — ABNORMAL HIGH (ref 11–51)

## 2020-06-13 LAB — MAGNESIUM: Magnesium: 2.3 mg/dL (ref 1.7–2.4)

## 2020-06-13 LAB — PHOSPHORUS: Phosphorus: 4.6 mg/dL (ref 2.5–4.6)

## 2020-06-13 IMAGING — DX DG CHEST 1V PORT
1 series · 1 of 1 positions shown · non-contrast
Comparison: [DATE]

CLINICAL DATA: Tremors

EXAM:
PORTABLE CHEST 1 VIEW

[chest ap]
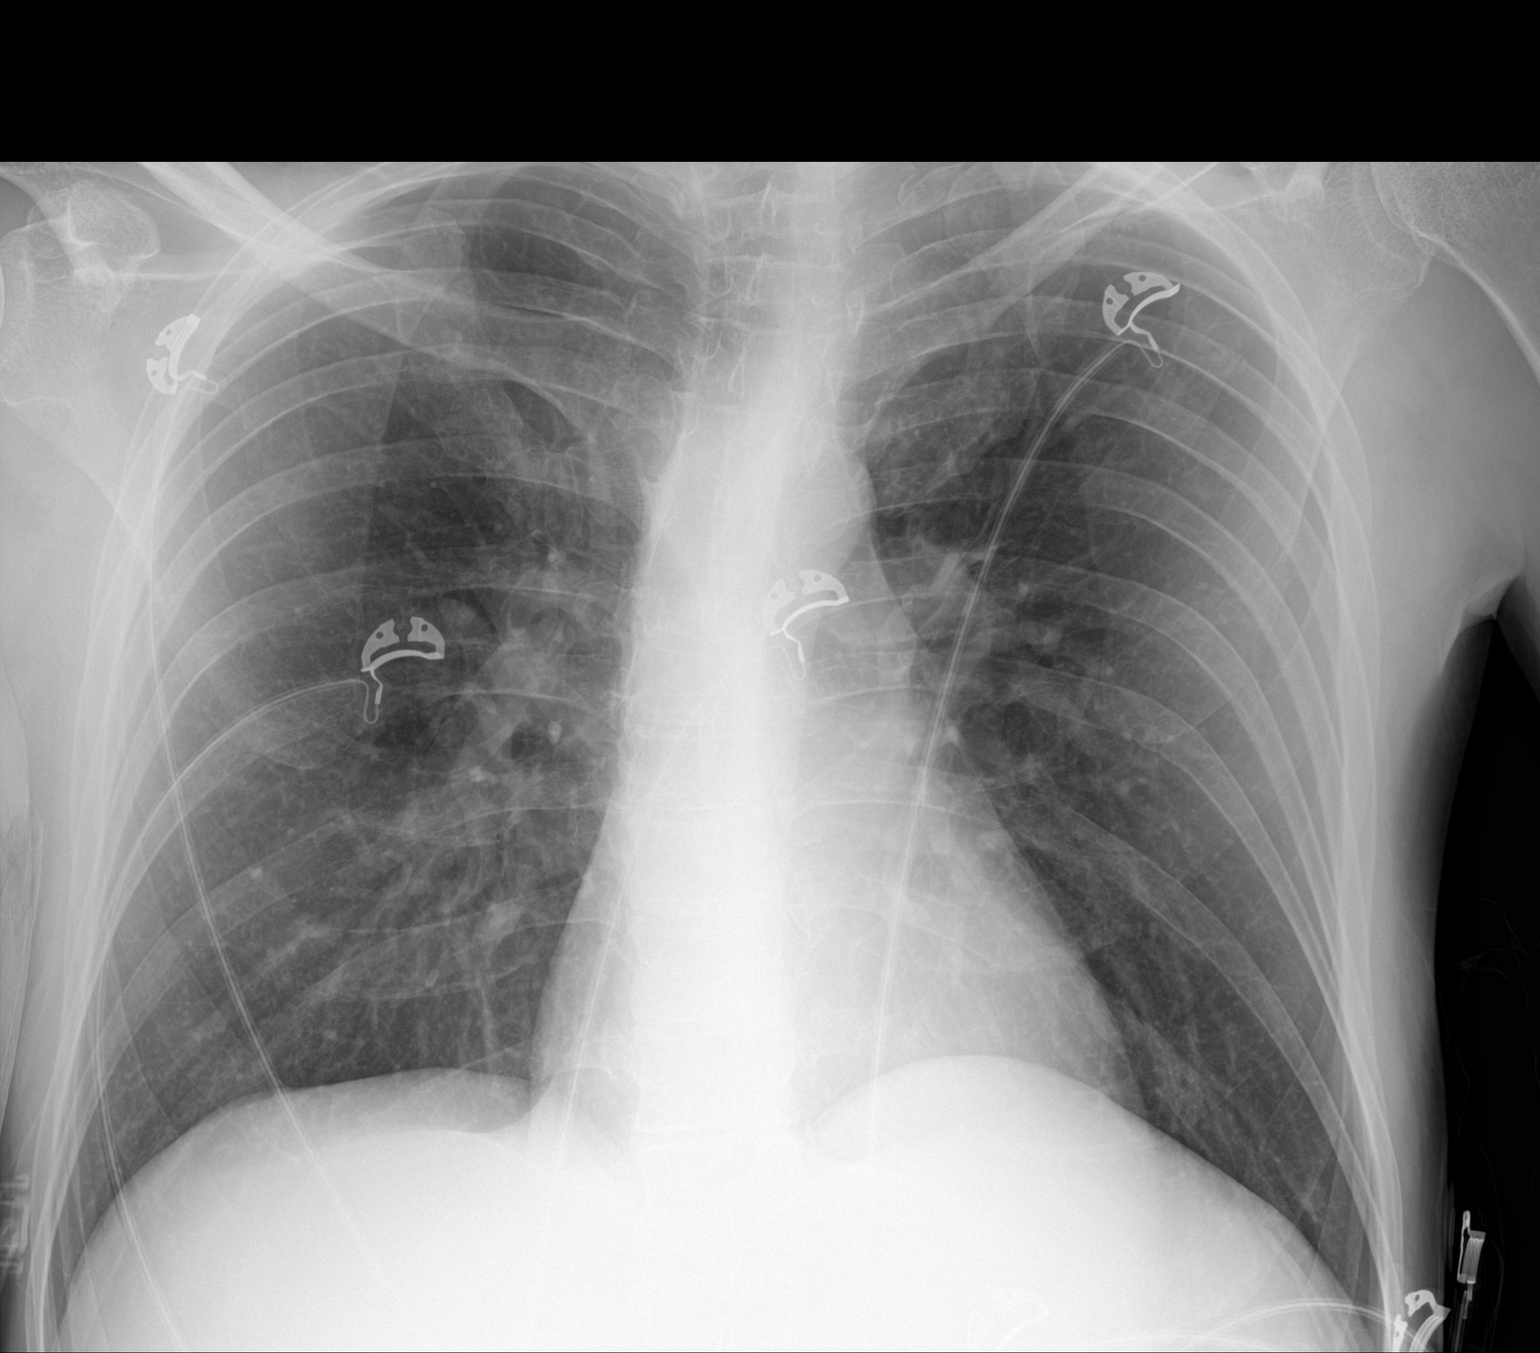

[1 of 1 positions shown; findings below may reference images not displayed]

FINDINGS: The heart size and mediastinal contours are within normal limits.
Both lungs are clear. The visualized skeletal structures are
unremarkable.
IMPRESSION: No active disease.

## 2020-06-13 IMAGING — US US ABDOMEN LIMITED
1 series · 14 of 25 positions shown · non-contrast
Comparison: None.

CLINICAL DATA: Upper abdominal pain.

EXAM:
ULTRASOUND ABDOMEN LIMITED RIGHT UPPER QUADRANT

[Series 1: us abdomen limited · 14 of 100 slices shown]
[im 1/100]
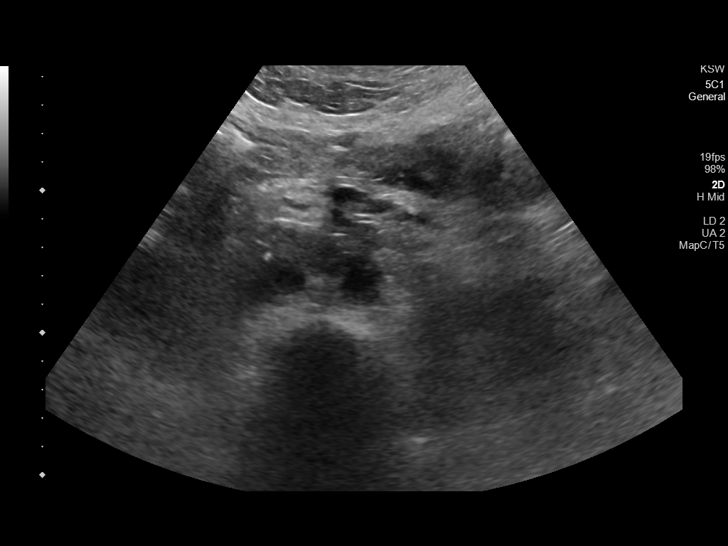
[im 9/100]
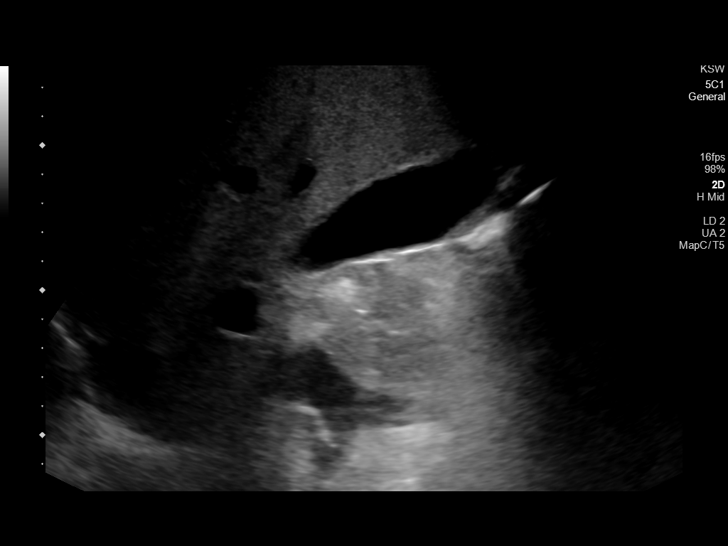
[im 17/100]
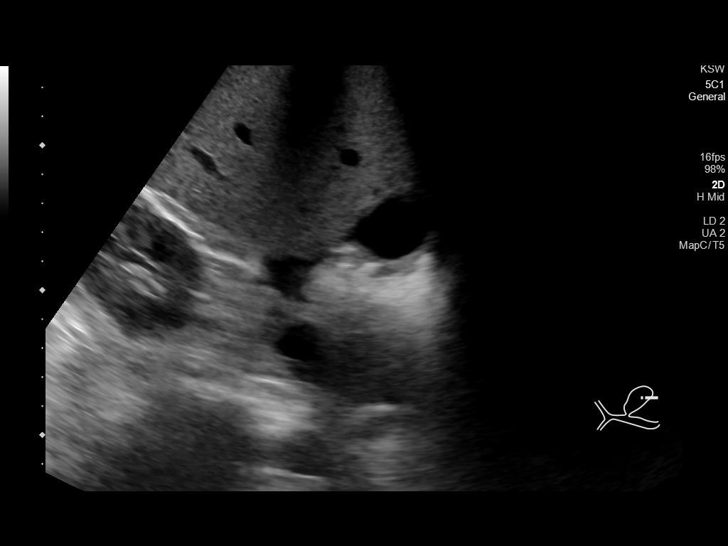
[im 25/100]
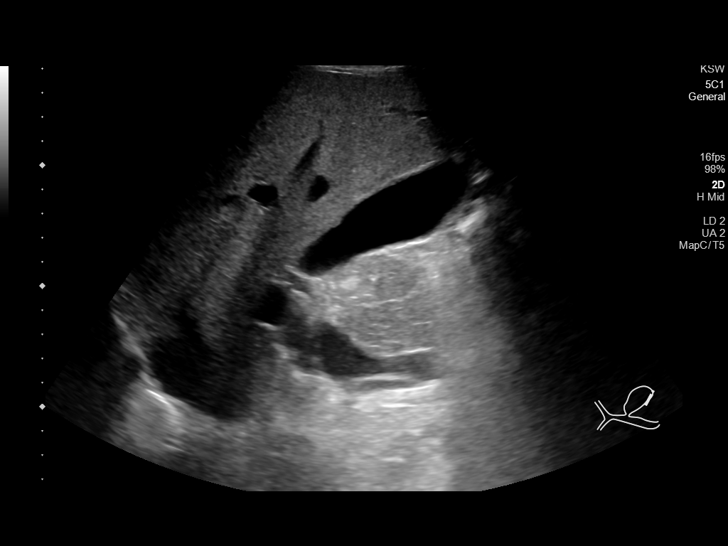
[im 34/100]
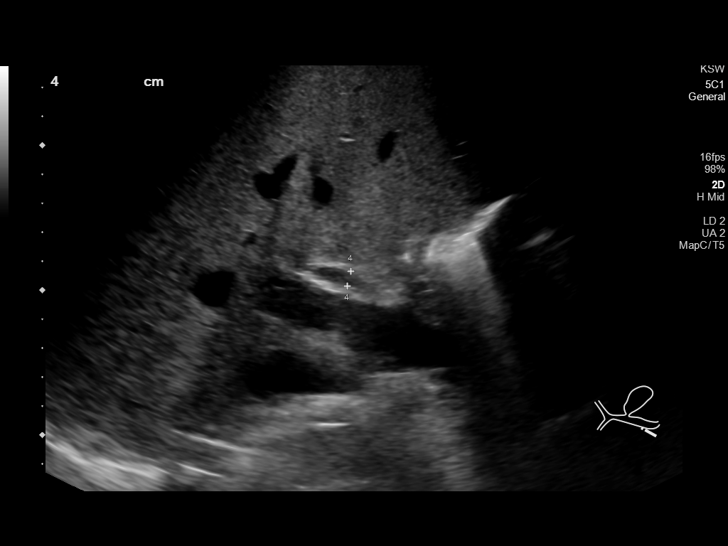
[im 38/100]
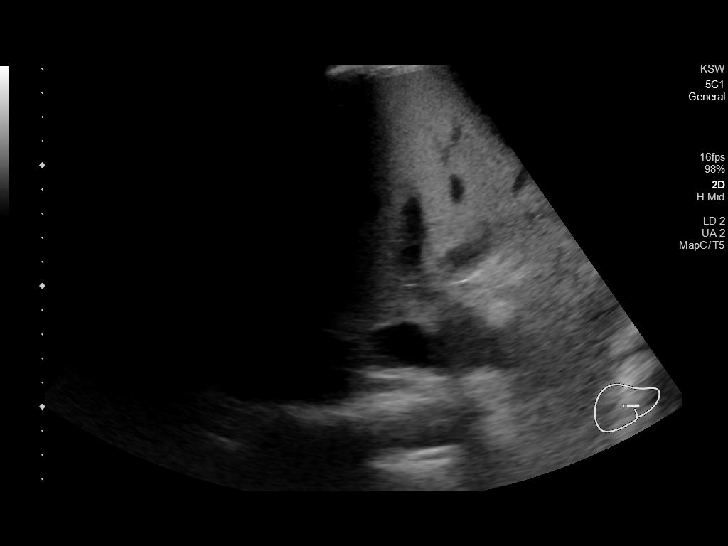
[im 46/100]
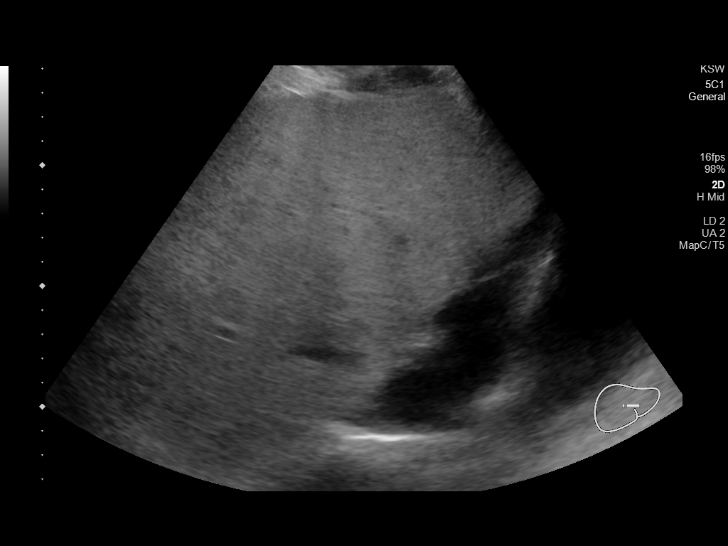
[im 54/100]
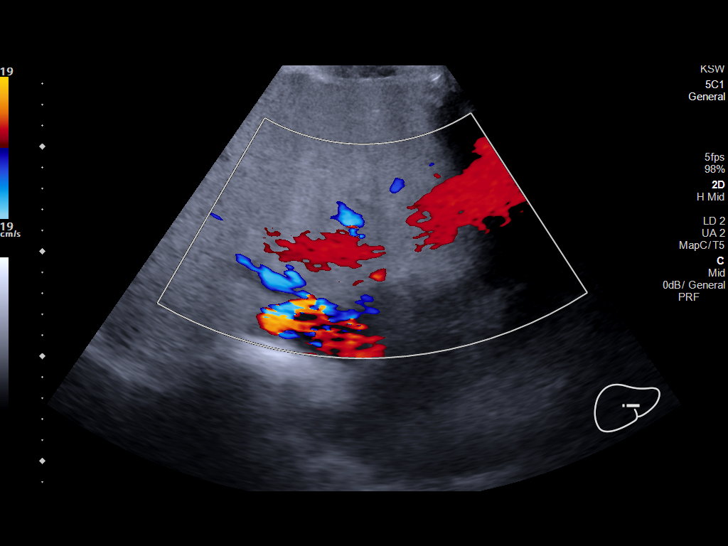
[im 62/100]
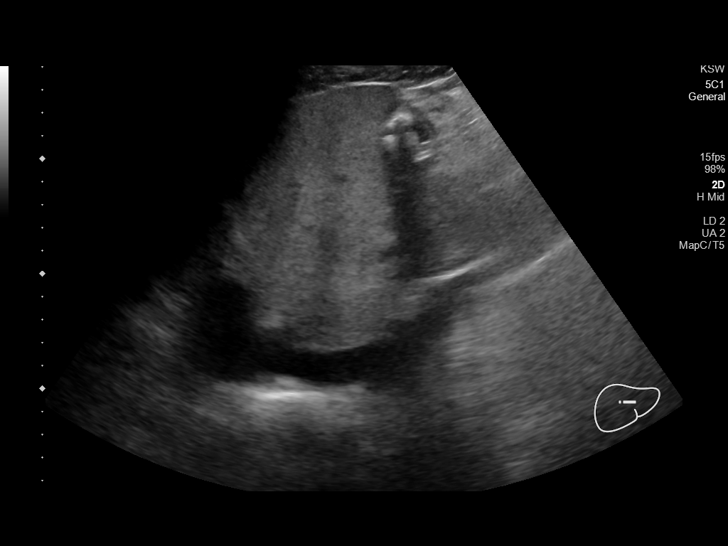
[im 67/100]
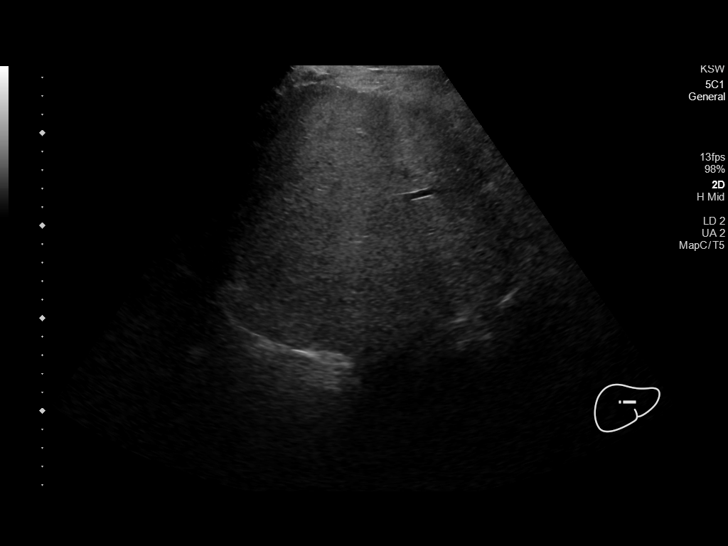
[im 75/100]
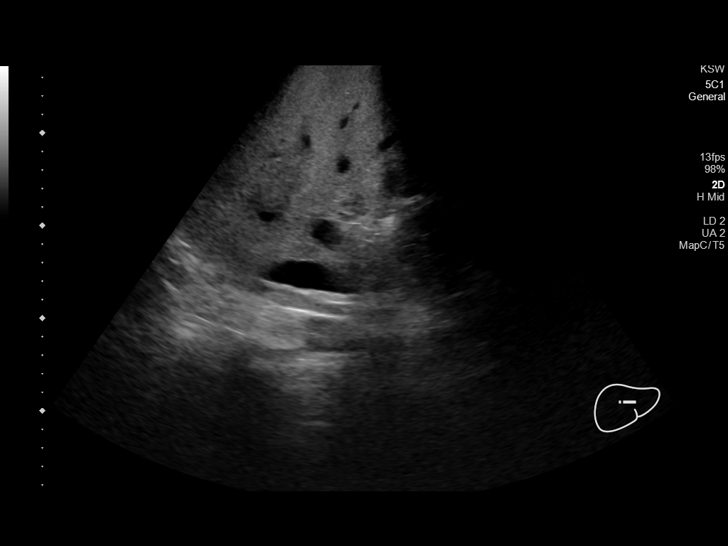
[im 83/100]
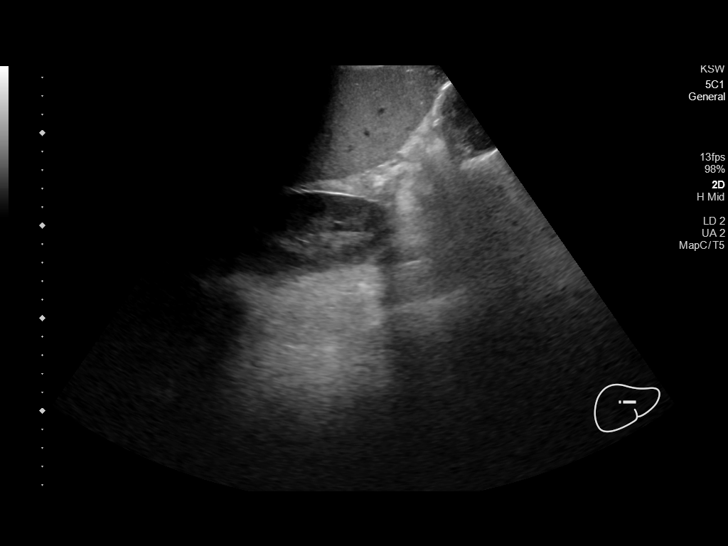
[im 91/100]
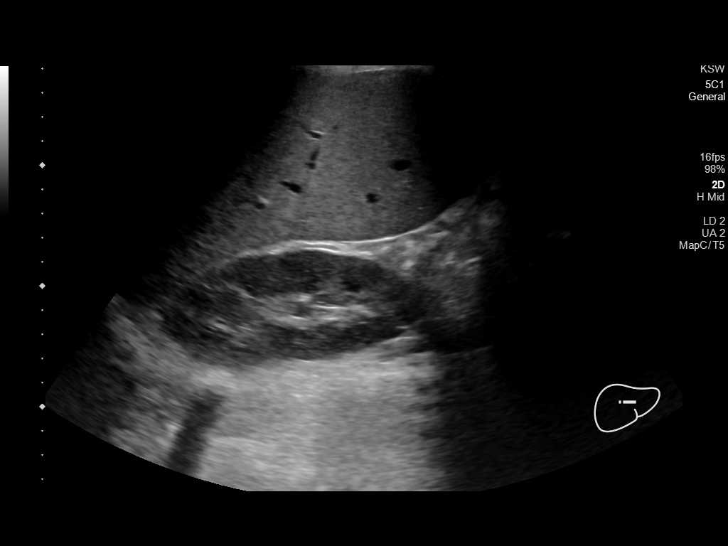
[im 100/100]
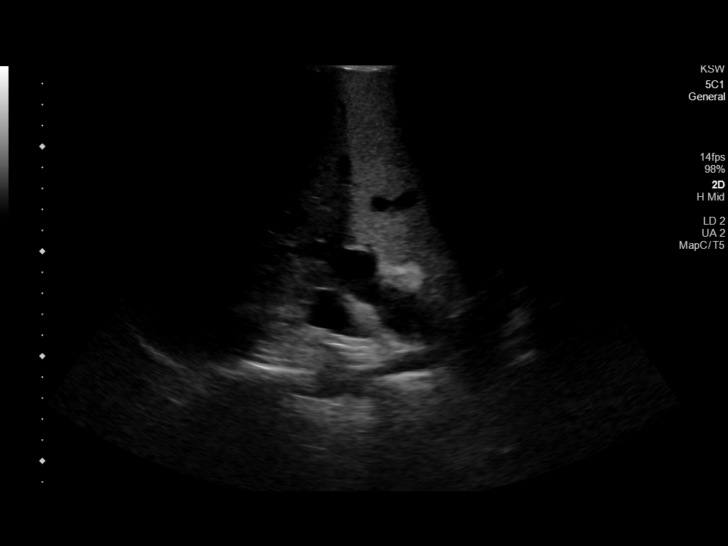

[14 of 25 positions shown; findings below may reference images not displayed]

FINDINGS: Gallbladder:

No gallstones or wall thickening visualized (2.2 mm). No sonographic
Murphy sign noted by sonographer.

Common bile duct:

Diameter: 5.3 mm

Liver:

The liver is enlarged and measures 19.1 cm in length. No focal
lesion identified. Diffusely increased echogenicity of the liver
parenchyma is noted. Portal vein is patent on color Doppler imaging
with normal direction of blood flow towards the liver.

Other: None.
IMPRESSION: Enlarged, fatty liver.

## 2020-06-13 IMAGING — DX DG HAND COMPLETE 3+V*L*
3 series · 3 of 3 positions shown · non-contrast
Comparison: Radiograph [DATE]

CLINICAL DATA: Left hand pain and swelling. Redness.

EXAM:
LEFT HAND - COMPLETE 3+ VIEW

[hand ap]
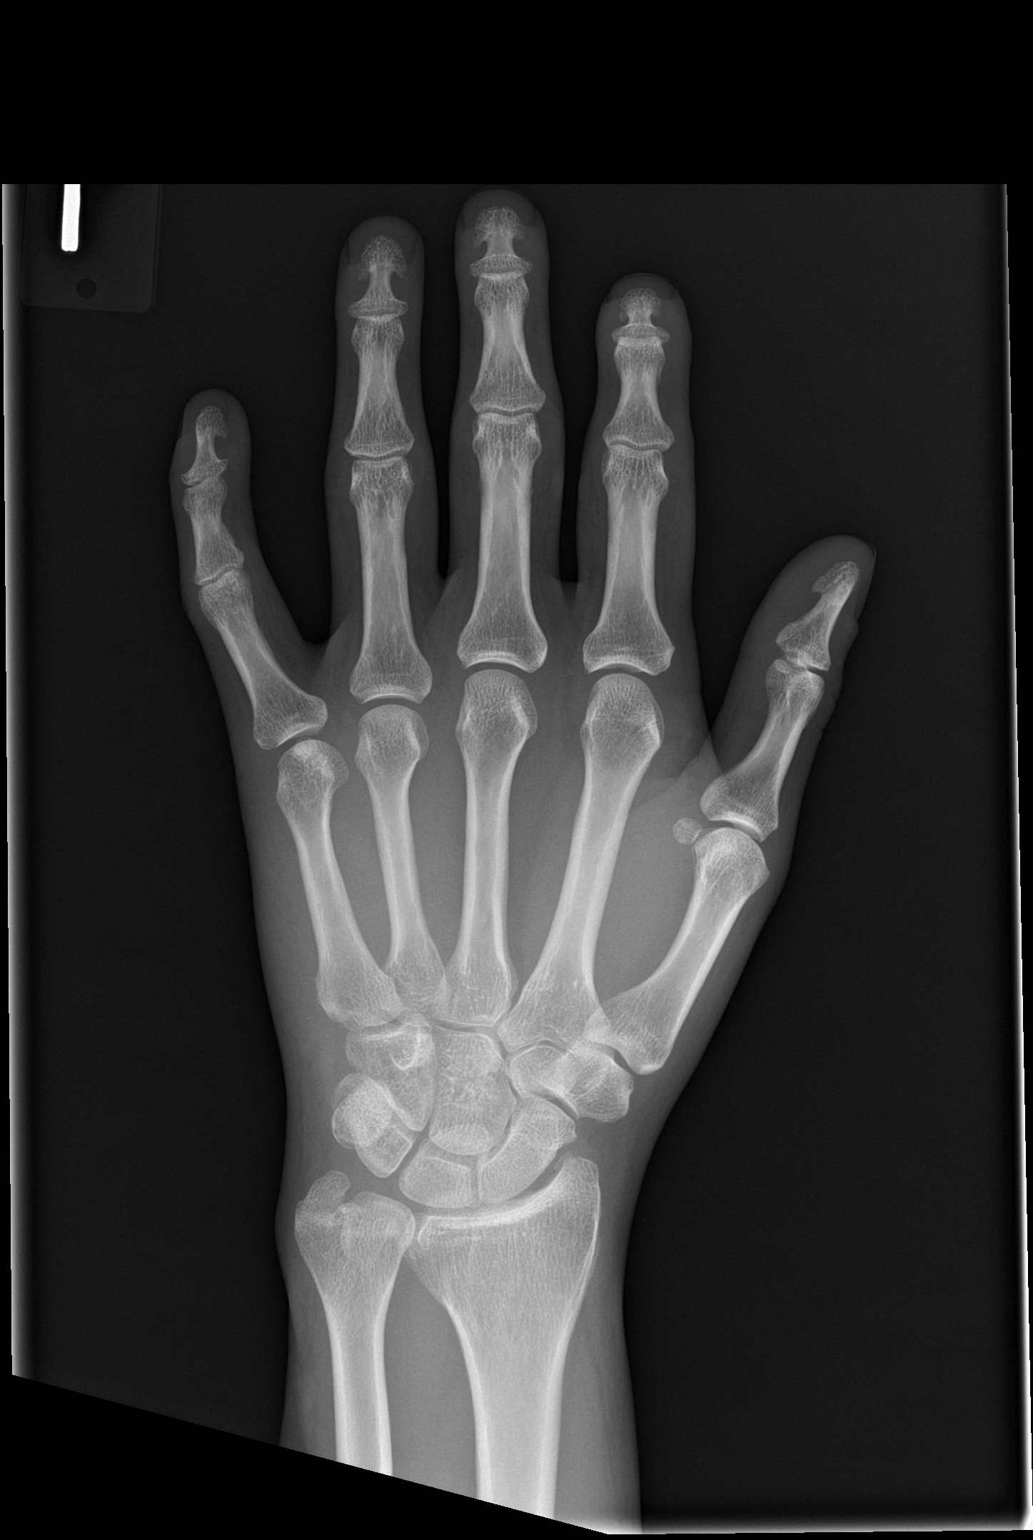

[hand obl]
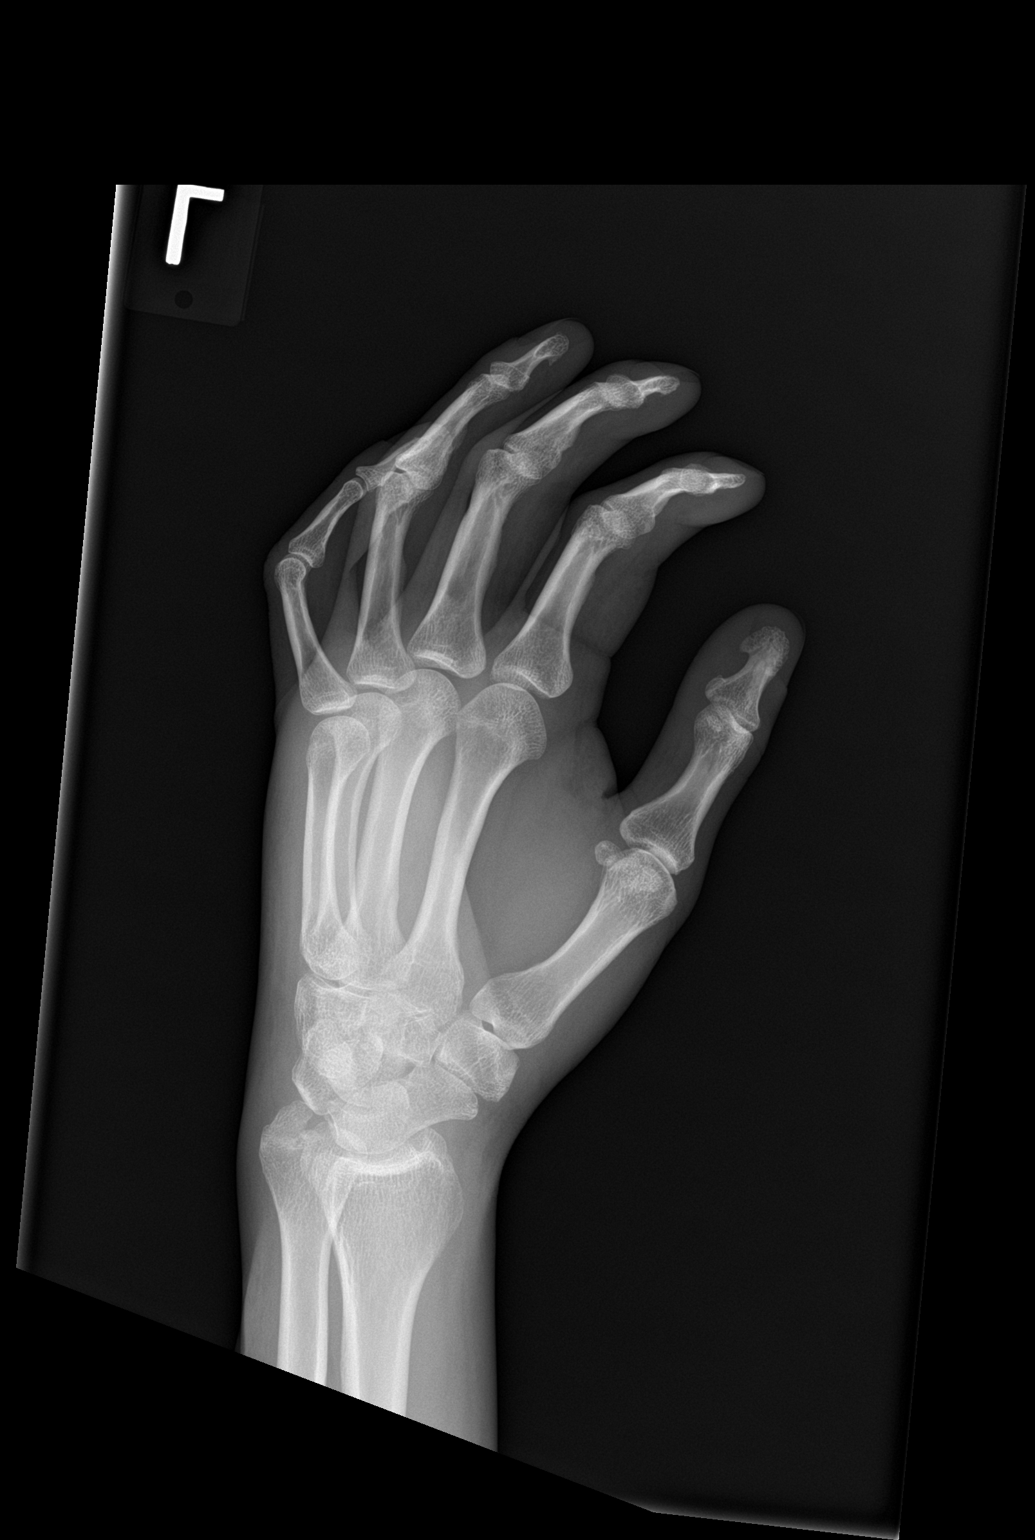

[hand lat]
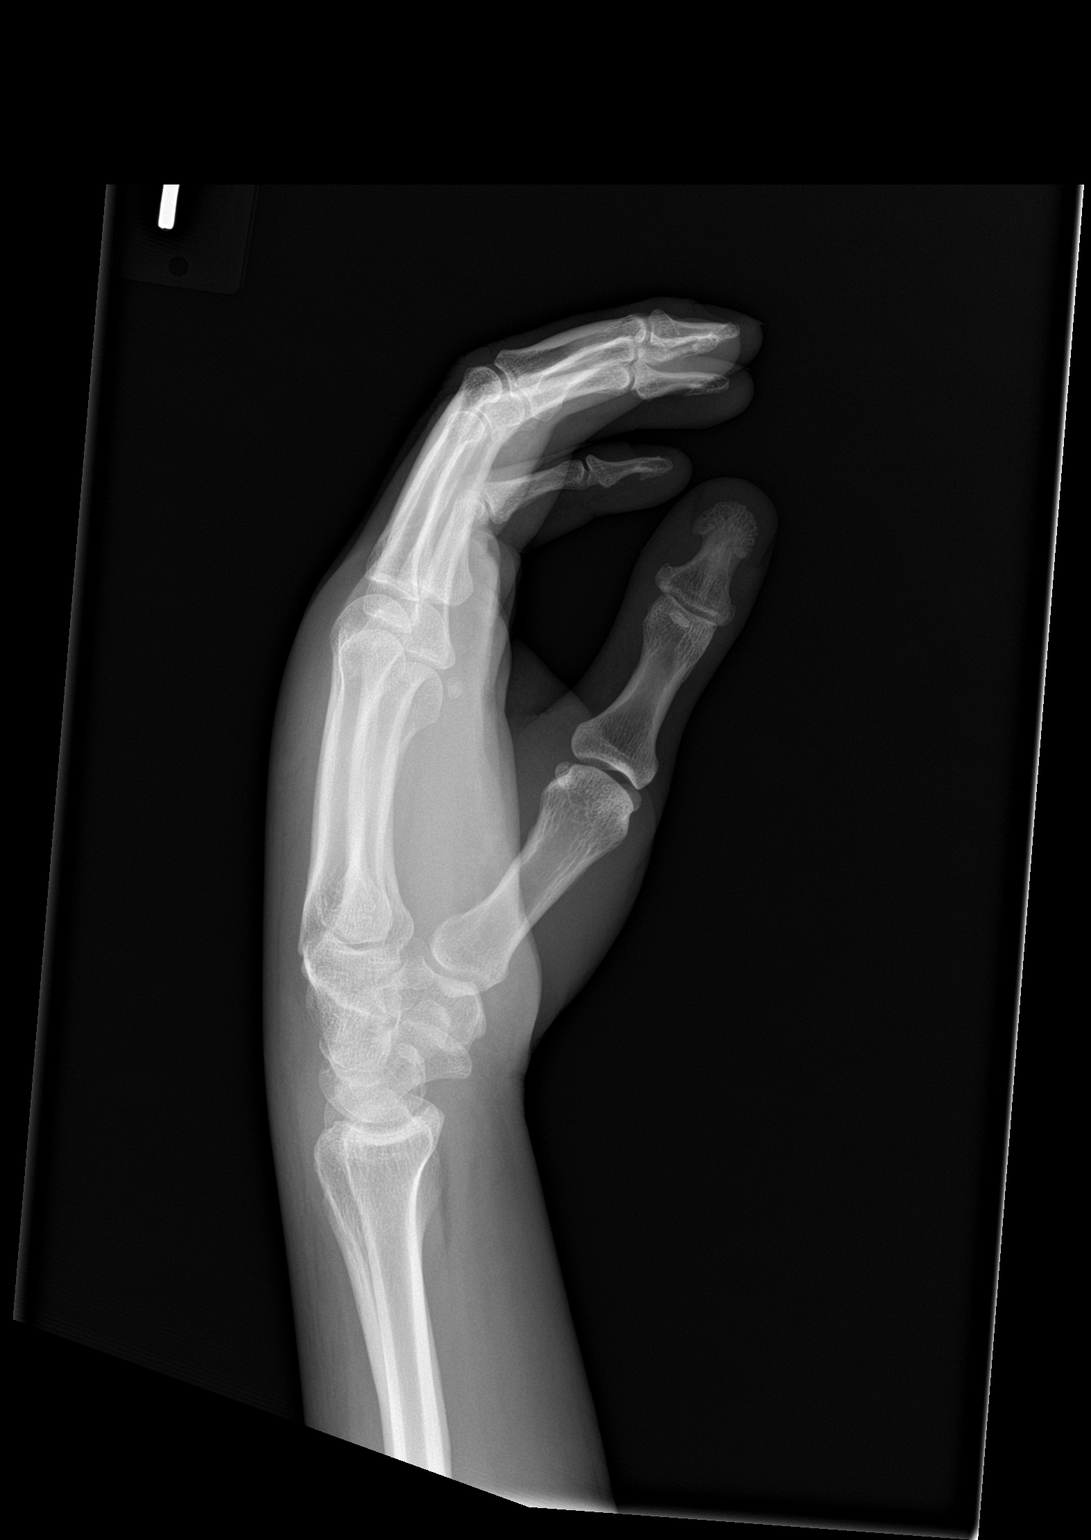

[3 of 3 positions shown; findings below may reference images not displayed]

FINDINGS: There is no evidence of fracture or dislocation. The digits are held
in flexion on all views. There is no evidence of arthropathy or
other focal bone abnormality. Mild soft tissue edema overlies the
dorsum of the metacarpals. No soft tissue air or radiopaque foreign
body.
IMPRESSION: Soft tissue edema over the dorsum of the metacarpals. No acute
osseous abnormality.

## 2020-06-13 MED ORDER — NICOTINE 21 MG/24HR TD PT24
21.0000 mg | MEDICATED_PATCH | Freq: Every day | TRANSDERMAL | Status: DC
  Administered 2020-06-15 – 2020-06-17 (×2): 21 mg via TRANSDERMAL
  Filled 2020-06-13 (×5): qty 1

## 2020-06-13 MED ORDER — LACTATED RINGERS IV BOLUS
1000.0000 mL | Freq: Once | INTRAVENOUS | Status: AC
  Administered 2020-06-13: 1000 mL via INTRAVENOUS

## 2020-06-13 MED ORDER — THIAMINE HCL 100 MG/ML IJ SOLN
100.0000 mg | Freq: Once | INTRAMUSCULAR | Status: AC
  Administered 2020-06-13: 100 mg via INTRAVENOUS
  Filled 2020-06-13: qty 2

## 2020-06-13 MED ORDER — LORAZEPAM 1 MG PO TABS
1.0000 mg | ORAL_TABLET | ORAL | Status: AC | PRN
  Administered 2020-06-14: 1 mg via ORAL
  Administered 2020-06-15 (×2): 2 mg via ORAL
  Administered 2020-06-15: 3 mg via ORAL
  Administered 2020-06-15 (×3): 2 mg via ORAL
  Filled 2020-06-13: qty 2
  Filled 2020-06-13: qty 1
  Filled 2020-06-13: qty 3
  Filled 2020-06-13 (×4): qty 2

## 2020-06-13 MED ORDER — POTASSIUM CHLORIDE 10 MEQ/100ML IV SOLN
10.0000 meq | INTRAVENOUS | Status: AC
  Administered 2020-06-13 – 2020-06-14 (×2): 10 meq via INTRAVENOUS
  Filled 2020-06-13 (×2): qty 100

## 2020-06-13 MED ORDER — ADULT MULTIVITAMIN W/MINERALS CH
1.0000 | ORAL_TABLET | Freq: Every day | ORAL | Status: DC
  Administered 2020-06-14 – 2020-06-18 (×5): 1 via ORAL
  Filled 2020-06-13 (×6): qty 1

## 2020-06-13 MED ORDER — PANTOPRAZOLE SODIUM 40 MG PO TBEC: 40.0000 mg | DELAYED_RELEASE_TABLET | Freq: Every day | ORAL | Status: DC

## 2020-06-13 MED ORDER — POTASSIUM CHLORIDE CRYS ER 20 MEQ PO TBCR
40.0000 meq | EXTENDED_RELEASE_TABLET | Freq: Once | ORAL | Status: AC
  Administered 2020-06-13: 40 meq via ORAL
  Filled 2020-06-13: qty 2

## 2020-06-13 MED ORDER — FOLIC ACID 5 MG/ML IJ SOLN
1.0000 mg | Freq: Every day | INTRAMUSCULAR | Status: DC
  Administered 2020-06-13: 1 mg via INTRAVENOUS
  Filled 2020-06-13 (×6): qty 0.2

## 2020-06-13 MED ORDER — LORAZEPAM 2 MG/ML IJ SOLN
1.0000 mg | Freq: Once | INTRAMUSCULAR | Status: AC
  Administered 2020-06-13: 1 mg via INTRAVENOUS
  Filled 2020-06-13: qty 1

## 2020-06-13 MED ORDER — LORAZEPAM 2 MG/ML IJ SOLN
1.0000 mg | INTRAMUSCULAR | Status: AC | PRN
  Administered 2020-06-14 (×3): 1 mg via INTRAVENOUS
  Administered 2020-06-14: 2 mg via INTRAVENOUS
  Administered 2020-06-14 (×2): 1 mg via INTRAVENOUS
  Administered 2020-06-14 – 2020-06-15 (×3): 2 mg via INTRAVENOUS
  Administered 2020-06-15: 1 mg via INTRAVENOUS
  Administered 2020-06-15: 2 mg via INTRAVENOUS
  Administered 2020-06-15: 1 mg via INTRAVENOUS
  Administered 2020-06-15 – 2020-06-16 (×2): 2 mg via INTRAVENOUS
  Administered 2020-06-16: 1 mg via INTRAVENOUS
  Administered 2020-06-16: 2 mg via INTRAVENOUS
  Filled 2020-06-13 (×16): qty 1

## 2020-06-13 MED ORDER — THIAMINE HCL 100 MG PO TABS
100.0000 mg | ORAL_TABLET | Freq: Every day | ORAL | Status: DC
  Administered 2020-06-14 – 2020-06-18 (×5): 100 mg via ORAL
  Filled 2020-06-13 (×5): qty 1

## 2020-06-13 MED ORDER — FOLIC ACID 1 MG PO TABS
1.0000 mg | ORAL_TABLET | Freq: Every day | ORAL | Status: DC
  Administered 2020-06-14 – 2020-06-18 (×6): 1 mg via ORAL
  Filled 2020-06-13 (×5): qty 1

## 2020-06-13 MED ORDER — ELVITEG-COBIC-EMTRICIT-TENOFAF 150-150-200-10 MG PO TABS
1.0000 | ORAL_TABLET | Freq: Every day | ORAL | Status: DC
  Administered 2020-06-14 – 2020-06-18 (×5): 1 via ORAL
  Filled 2020-06-13 (×5): qty 1

## 2020-06-13 NOTE — ED Notes (Signed)
Hospitalist at bedside 
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
## 2020-06-13 NOTE — ED Notes (Signed)
US at bedside
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
## 2020-06-13 NOTE — ED Provider Notes (Signed)
I received this patient in signout from Dr. Anitra Lauth. H/o EtOH abuse and HIV p/w vomiting, abd pain, shaking, depressed and suicidal. K 2.7, receiving IV and oral repletion. Pending lipase and reassessment.  Lipase significantly elevated at 2004. AST and ALT mildly elevated similar to previous and tbili normal which makes obstructing biliary stone less likely. I have ordered RUQ [place].   I discussed admission w/ patient who is agreeable to admission. Discussed admission w/ [facility], Dr. Crissie Reese.    [name], Gary Finland, MD 06/13/20 [phone]

## 2020-06-14 ENCOUNTER — Encounter (HOSPITAL_COMMUNITY): Payer: Self-pay | Admitting: Family Medicine

## 2020-06-14 DIAGNOSIS — F10229 Alcohol dependence with intoxication, unspecified: Secondary | ICD-10-CM

## 2020-06-14 DIAGNOSIS — R7401 Elevation of levels of liver transaminase levels: Secondary | ICD-10-CM

## 2020-06-14 DIAGNOSIS — R45851 Suicidal ideations: Secondary | ICD-10-CM

## 2020-06-14 DIAGNOSIS — K852 Alcohol induced acute pancreatitis without necrosis or infection: Principal | ICD-10-CM

## 2020-06-14 DIAGNOSIS — E876 Hypokalemia: Secondary | ICD-10-CM

## 2020-06-14 DIAGNOSIS — B20 Human immunodeficiency virus [HIV] disease: Secondary | ICD-10-CM

## 2020-06-14 DIAGNOSIS — F10230 Alcohol dependence with withdrawal, uncomplicated: Secondary | ICD-10-CM

## 2020-06-14 LAB — COMPREHENSIVE METABOLIC PANEL
ALT: 95 U/L — ABNORMAL HIGH (ref 0–44)
AST: 154 U/L — ABNORMAL HIGH (ref 15–41)
Albumin: 3.3 g/dL — ABNORMAL LOW (ref 3.5–5.0)
Alkaline Phosphatase: 42 U/L (ref 38–126)
Anion gap: 10 (ref 5–15)
BUN: <5 mg/dL — ABNORMAL LOW (ref 6–20)
CO2: 27 mmol/L (ref 22–32)
Calcium: 8.1 mg/dL — ABNORMAL LOW (ref 8.9–10.3)
Chloride: 103 mmol/L (ref 98–111)
Creatinine, Ser: 0.44 mg/dL — ABNORMAL LOW (ref 0.61–1.24)
GFR calc Af Amer: >60 mL/min (ref >60)
GFR calc non Af Amer: >60 mL/min (ref >60)
Glucose, Bld: 73 mg/dL (ref 70–99)
Potassium: 3.6 mmol/L (ref 3.5–5.1)
Sodium: 140 mmol/L (ref 135–145)
Total Bilirubin: 0.5 mg/dL (ref 0.3–1.2)
Total Protein: 5.9 g/dL — ABNORMAL LOW (ref 6.5–8.1)

## 2020-06-14 LAB — CBC
HCT: 33.7 % — ABNORMAL LOW (ref 39.0–52.0)
Hemoglobin: 11.5 g/dL — ABNORMAL LOW (ref 13.0–17.0)
MCH: 33.7 pg (ref 26.0–34.0)
MCHC: 34.1 g/dL (ref 30.0–36.0)
MCV: 98.8 fL (ref 80.0–100.0)
Platelets: 144 10*3/uL — ABNORMAL LOW (ref 150–400)
RBC: 3.41 MIL/uL — ABNORMAL LOW (ref 4.22–5.81)
RDW: 14 % (ref 11.5–15.5)
WBC: 2.8 10*3/uL — ABNORMAL LOW (ref 4.0–10.5)
nRBC: 0 % (ref 0.0–0.2)

## 2020-06-14 LAB — RAPID URINE DRUG SCREEN, HOSP PERFORMED
Amphetamines: NOT DETECTED
Barbiturates: NOT DETECTED
Benzodiazepines: POSITIVE — AB
Cocaine: NOT DETECTED
Opiates: NOT DETECTED
Tetrahydrocannabinol: NOT DETECTED

## 2020-06-14 LAB — SARS CORONAVIRUS 2 BY RT PCR (HOSPITAL ORDER, PERFORMED IN ~~LOC~~ HOSPITAL LAB): SARS Coronavirus 2: NEGATIVE

## 2020-06-14 MED ORDER — ENOXAPARIN SODIUM 40 MG/0.4ML ~~LOC~~ SOLN
40.0000 mg | SUBCUTANEOUS | Status: DC
  Administered 2020-06-14 – 2020-06-15 (×2): 40 mg via SUBCUTANEOUS
  Filled 2020-06-14 (×5): qty 0.4

## 2020-06-14 MED ORDER — ONDANSETRON HCL 4 MG/2ML IJ SOLN
4.0000 mg | Freq: Four times a day (QID) | INTRAMUSCULAR | Status: DC | PRN
  Administered 2020-06-17: 4 mg via INTRAVENOUS
  Filled 2020-06-14: qty 2

## 2020-06-14 MED ORDER — ACETAMINOPHEN 325 MG PO TABS
650.0000 mg | ORAL_TABLET | Freq: Four times a day (QID) | ORAL | Status: DC | PRN
  Administered 2020-06-15: 650 mg via ORAL
  Filled 2020-06-14: qty 2

## 2020-06-14 MED ORDER — POLYETHYLENE GLYCOL 3350 17 G PO PACK: 17.0000 g | PACK | Freq: Every day | ORAL | Status: DC | PRN

## 2020-06-14 MED ORDER — ACETAMINOPHEN 650 MG RE SUPP: 650.0000 mg | Freq: Four times a day (QID) | RECTAL | Status: DC | PRN

## 2020-06-14 MED ORDER — ONDANSETRON HCL 4 MG PO TABS: 4.0000 mg | ORAL_TABLET | Freq: Four times a day (QID) | ORAL | Status: DC | PRN

## 2020-06-14 MED ORDER — LACTATED RINGERS IV SOLN
INTRAVENOUS | Status: DC
  Filled 2020-06-14: qty 1000

## 2020-06-14 MED ORDER — PANTOPRAZOLE SODIUM 40 MG PO TBEC
40.0000 mg | DELAYED_RELEASE_TABLET | Freq: Two times a day (BID) | ORAL | Status: DC
  Administered 2020-06-15 – 2020-06-18 (×7): 40 mg via ORAL
  Filled 2020-06-14 (×6): qty 1

## 2020-06-14 MED ORDER — HYDROMORPHONE HCL 1 MG/ML IJ SOLN
0.5000 mg | INTRAMUSCULAR | Status: DC | PRN
  Administered 2020-06-14 – 2020-06-16 (×11): 1 mg via INTRAVENOUS
  Administered 2020-06-16: 0.5 mg via INTRAVENOUS
  Administered 2020-06-16 – 2020-06-17 (×4): 1 mg via INTRAVENOUS
  Filled 2020-06-14 (×16): qty 1

## 2020-06-14 NOTE — H&P (Signed)
[facility] History and Physical  Gary Amesony P Cass XBJ:[id number] DOB: 01/17/1986 DOA: 06/13/2020  Referring physician: Dr. Clarene DukeLittle PCP: Patient, No Pcp Per   Chief Complaint: Suicidal ideation  HPI: Gary [name] is a 34 y.o. male with history of alcohol use disorder, HIV, homelessness, prior GI bleeds, who presents with multiple issues.  Patient reports that earlier today he was drinking with his friends and then had an argument with them over money and his continued drinking. Reports that he still has money they had an argument and then he came to the ED. He says he has been trying to drink himself to death and thinks about dying often. He was seen in the ED several days ago for abdominal pain and reports that he is continued to have severe abdominal pain since that time. He also endorses daily nausea and vomiting with inability to take good p.o. either liquids or solids.  Currently he would like to stop drinking, reports he has a heavy intake but does not give details. Reports and review of chart confirms multiple prior episodes of alcohol withdrawal. He endorses occasionally smoking methamphetamines but denies any other drug use.  He is currently taking Genvoya for his HIV, he denies any recently missed doses.  He is currently homeless and sleeping outdoors.  In the ED on initial presentation vital signs unremarkable, however patient clearly intoxicated. Lab work-up notable for CMP with potassium 2.7 but otherwise unremarkable, LFTs notable for AST 157 and ALT 100 with normal bilirubin, call level 293, lipase 2004. Compared to prior ED visit from July 31 labs largely unchanged with exception of increase in LFTs. Abdominal ultrasound was also obtained which showed fatty liver findings, and chest x-ray which showed no active chest disease.  He was admitted for management of the multiple above issues.   Review of Systems:  Pertinent positives and negative per HPI, all others reviewed  and negative   Past Medical History:  Diagnosis Date  . Alcohol abuse   . HIV (human immunodeficiency virus infection) (HCC)   . Subdural hematoma [facility](HCC)    Past Surgical History:  Procedure Laterality Date  . INCISION AND DRAINAGE PERIRECTAL ABSCESS N/A 09/24/2016   Procedure: IRRIGATION AND DEBRIDEMENT PERIRECTAL ABSCESS;  Surgeon: Ricarda Frameharles Woodham, MD;  Location: [facility] ORS;  Service: General;  Laterality: N/A;  . none     Social History:  reports that he has quit smoking. His smoking use included cigarettes. He smoked 0.50 packs per day. He has never used smokeless tobacco. He reports current alcohol use of about 3.0 standard drinks of alcohol per week. He reports previous drug use. Drugs: Marijuana and Methamphetamines.  Allergies  Allergen Reactions  . Tegretol [Carbamazepine] Other (See Comments)    Caused vertigo for 2 days after taking it  . Caffeine Palpitations    Family History  Problem Relation Age of Onset  . Alcohol abuse Mother   . Alcohol abuse Father      Prior to Admission medications   Medication Sig Start Date End Date Taking? Authorizing Provider  carbamazepine (TEGRETOL) 200 MG tablet 600mg  PO QD X 1D, then 400mg  QD X 1D, then 200mg  PO QD X 2D Patient not taking: Reported on 06/07/2020 05/30/20   Albrizze, Caroleen HammanKaitlyn E, PA-C  elvitegravir-cobicistat-emtricitabine-tenofovir (GENVOYA) 150-150-200-10 MG TABS tablet Take 1 tablet by mouth daily with breakfast. 05/18/20   Linwood DibblesKnapp, Jon, MD  folic acid (FOLVITE) 1 MG tablet Take 1 tablet (1 mg total) by mouth daily. Patient not taking: Reported on 06/07/2020  05/09/20   Burnadette Pop, MD  nicotine (NICODERM CQ - DOSED IN MG/24 HOURS) 21 mg/24hr patch Place 1 patch (21 mg total) onto the skin daily. Patient not taking: Reported on 06/07/2020 05/09/20   Burnadette Pop, MD  pantoprazole (PROTONIX) 40 MG tablet Take 1 tablet (40 mg total) by mouth daily. Patient not taking: Reported on 06/07/2020 05/09/20   Burnadette Pop, MD    sucralfate (CARAFATE) 1 GM/10ML suspension Take 10 mLs (1 g total) by mouth 4 (four) times daily -  with meals and at bedtime for 14 days. Patient not taking: Reported on 06/07/2020 05/09/20 06/07/20  Burnadette Pop, MD  thiamine 100 MG tablet Take 1 tablet (100 mg total) by mouth daily. Patient not taking: Reported on 06/07/2020 05/09/20   Burnadette Pop, MD  famotidine (PEPCID) 20 MG tablet Take 1 tablet (20 mg total) by mouth 2 (two) times daily. Patient not taking: Reported on 06/01/2019 05/14/19 06/02/19  Benjiman Core, MD   Physical Exam: Vitals:   06/13/20 1835 06/13/20 2134 06/13/20 2200  BP: 130/84 117/76   Pulse: 86 87 85  Resp: 16 18 15   Temp: 99 F (37.2 C)    TempSrc: Oral    SpO2: 95% 100% 99%    Wt Readings from Last 3 Encounters:  05/28/20 68 kg  05/18/20 68 kg  05/08/20 74.5 kg    General:  Appears calm and comfortable, appears intoxicated Eyes: PERRL, normal lids, irises & conjunctiva ENT: grossly normal hearing, lips & tongue Neck: no LAD, masses or thyromegaly Cardiovascular: RRR, no m/r/g. No LE edema.  Respiratory: CTA bilaterally, no w/r/r. Normal respiratory effort. Abdomen: soft, tender to palpation in the epigastrium Skin: no rash or induration seen on limited exam Musculoskeletal: grossly normal tone BUE/BLE Psychiatric: Active suicidal ideation with a plan, no homicidal ideation, speech fluent and appropriate Neurologic: grossly non-focal.          Labs on Admission:  Basic Metabolic Panel: Recent Labs  Lab 06/07/20 1230 06/09/20 0538 06/13/20 1845 06/13/20 2154 06/13/20 2202  NA 139 135 137  --   --   K 3.9 3.4* 2.7*  --   --   CL 102 98 101  --   --   CO2 18* 23 24  --   --   GLUCOSE 250* 88 130*  --   --   BUN 13 6 <5*  --   --   CREATININE 1.06 0.56* 0.64  --   --   CALCIUM 9.0 8.9 8.5*  --   --   MG  --   --   --  2.3  --   PHOS  --   --   --   --  4.6   Liver Function Tests: Recent Labs  Lab 06/07/20 1230 06/09/20 0538  06/13/20 2154  AST 134* 54* 157*  ALT 134* 70* 100*  ALKPHOS 73 49 49  BILITOT 1.0 1.6* 0.7  PROT 7.6 6.6 7.2  ALBUMIN 4.2 3.7 3.9   Recent Labs  Lab 06/09/20 0538 06/13/20 2154  LIPASE 2,209* 2,004*   No results for input(s): AMMONIA in the last 168 hours. CBC: Recent Labs  Lab 06/07/20 1230 06/09/20 0538 06/13/20 1845  WBC 4.5 3.8* 3.8*  NEUTROABS  --  2.7  --   HGB 15.0 14.5 12.5*  HCT 45.1 41.9 35.8*  MCV 99.1 97.9 96.8  PLT 165 90* 159   Cardiac Enzymes: No results for input(s): CKTOTAL, CKMB, CKMBINDEX, TROPONINI in the last 168 hours.  BNP (last 3 results) No results for input(s): BNP in the last 8760 hours.  ProBNP (last 3 results) No results for input(s): PROBNP in the last 8760 hours.  CBG: Recent Labs  Lab 06/08/20 1836 06/09/20 0520  GLUCAP 100* 86    Radiological Exams on Admission: US Abdomen Limited  Result Date: 06/13/2020 CLINICAL DATA:  Upper abdominal pain. EXAM: ULTRASOUND ABDOMEN LIMITED RIGHT UPPER QUADRANT COMPARISON:  None. FINDINGS: Gallbladder: No gallstones or wall thickening visualized (2.2 mm). No sonographic Murphy sign noted by sonographer. Common bile duct: Diameter: 5.3 mm Liver: The liver is enlarged and measures 19.1 cm in length. No focal lesion identified. Diffusely increased echogenicity of the liver parenchyma is noted. Portal vein is patent on color Doppler imaging with normal direction of blood flow towards the liver. Other: None. IMPRESSION: Enlarged, fatty liver. Electronically Signed   By: Aram Candela M.D.   On: 06/13/2020 23:56   DG Chest Port 1 View  Result Date: 06/13/2020 CLINICAL DATA:  Tremors EXAM: PORTABLE CHEST 1 VIEW COMPARISON:  05/04/2020 FINDINGS: The heart size and mediastinal contours are within normal limits. Both lungs are clear. The visualized skeletal structures are unremarkable. IMPRESSION: No active disease. Electronically Signed   By: Jasmine Pang M.D.   On: 06/13/2020 22:19   DG Hand  Complete Left  Result Date: 06/13/2020 CLINICAL DATA:  Left hand pain and swelling. Redness. EXAM: LEFT HAND - COMPLETE 3+ VIEW COMPARISON:  Radiograph 07/19/2017 FINDINGS: There is no evidence of fracture or dislocation. The digits are held in flexion on all views. There is no evidence of arthropathy or other focal bone abnormality. Mild soft tissue edema overlies the dorsum of the metacarpals. No soft tissue air or radiopaque foreign body. IMPRESSION: Soft tissue edema over the dorsum of the metacarpals. No acute osseous abnormality. Electronically Signed   By: Narda Rutherford M.D.   On: 06/13/2020 22:04    EKG: Independently reviewed. Normal sinus rhythm with no ischemic changes, compared to prior T wave inversions in V3 through V5 have now resolved.  Assessment/Plan Active Problems:   HIV disease (HCC)   Alcohol dependence (HCC)   Major depressive disorder, recurrent severe without psychotic features (HCC)   Transaminitis   Alcohol withdrawal (HCC)   Alcoholic hepatitis without ascites   Pancreatitis   Suicidal ideation   #Suicidal ideation Patient endorsing active suicidal ideation with plan to drink himself to death. Behavioral health has already been consulted by ED providers, they will see patient was once he is no longer intoxicated. -Suicide precautions -One-to-one sitter  #Alcohol withdrawal #Alcohol dependence #Alcoholic hepatitis #Pancreatitis Patient with history of alcohol use and elevated LFTs and elevated lipase as well as symptoms consistent with acute pancreatitis and alcoholic hepatitis. Likely some element of alcoholic gastritis as well. Hepatitis panel checked last in November 2020 and was negative. -N.p.o., advance diet as tolerated -Lactated Ringer's at 150 cc an hour -IV Dilaudid for pain as needed -CIWA protocol with Ativan given elevated LFTs, consider transition to longer acting benzodiazepine pending clinical course -Folic acid, thiamine,  multivitamin -Pantoprazole 40 mg IV twice daily -Trend LFTs -Offer treatment for alcohol use disorder prior to discharge, had preliminary discussion regarding naloxone with the patient   #Hypokalemia Potassium 2.7 on arrival, has been ordered for 20 mEq IV potassium and 40 mEq p.o. Trend BMP and replete as needed.  #HIV Patient denies missing any doses recently of Genvoya continue while inpatient. Last saw infectious diseases in April of this year, at that time  had undetectable viral load.   Code Status: Full Code, unconfirmed DVT Prophylaxis: Lovenox Family Communication: none Disposition Plan: Inpatient  Time spent: 53 min  Venora Maples MD/MPH [facility]

## 2020-06-14 NOTE — ED Notes (Signed)
Admitting provider at bedside.

## 2020-06-14 NOTE — Discharge Instructions (Signed)
Acute Pancreatitis  Acute pancreatitis happens when the pancreas gets swollen. The pancreas is a large gland in the body that helps to control blood sugar. It also makes enzymes that help to digest food. This condition can last a few days and cause serious problems. The lungs, heart, and kidneys may stop working. What are the causes? Causes include:  Alcohol abuse.  Drug abuse.  Gallstones.  A tumor in the pancreas. Other causes include:  Some medicines.  Some chemicals.  Diabetes.  An infection.  Damage caused by an accident.  The poison (venom) from a scorpion bite.  Belly (abdominal) surgery.  The body's defense system (immune system) attacking the pancreas (autoimmune pancreatitis).  Genes that are passed from parent to child (inherited). In some cases, the cause is not known. What are the signs or symptoms?  Pain in the upper belly that may be felt in the back. The pain may be very bad.  Swelling of the belly.  Feeling sick to your stomach (nauseous) and throwing up (vomiting).  Fever. How is this treated? You will likely have to stay in the hospital. Treatment may include:  Pain medicine.  Fluid through an IV tube.  Placing a tube in the stomach to take out the stomach contents. This may help you stop throwing up.  Not eating for 3-4 days.  Antibiotic medicines, if you have an infection.  Treating any other problems that may be the cause.  Steroid medicines, if your problem is caused by your defense system attacking your body's own tissues.  Surgery. Follow these instructions at home: Eating and drinking   Follow instructions from your doctor about what to eat and drink.  Eat foods that do not have a lot of fat in them.  Eat small meals often. Do not eat big meals.  Drink enough fluid to keep your pee (urine) pale yellow.  Do not drink alcohol if it caused your condition. Medicines  Take over-the-counter and prescription medicines only  as told by your doctor.  Ask your doctor if the medicine prescribed to you: ? Requires you to avoid driving or using heavy machinery. ? Can cause trouble pooping (constipation). You may need to take steps to prevent or treat trouble pooping:  Take over-the-counter or prescription medicines.  Eat foods that are high in fiber. These include beans, whole grains, and fresh fruits and vegetables.  Limit foods that are high in fat and sugar. These include fried or sweet foods. General instructions  Do not use any products that contain nicotine or tobacco, such as cigarettes, e-cigarettes, and chewing tobacco. If you need help quitting, ask your doctor.  Get plenty of rest.  Check your blood sugar at home as told by your doctor.  Keep all follow-up visits as told by your doctor. This is important. Contact a doctor if:  You do not get better as quickly as expected.  You have new symptoms.  Your symptoms get worse.  You have pain or weakness that lasts a long time.  You keep feeling sick to your stomach.  You get better and then you have pain again.  You have a fever. Get help right away if:  You cannot eat or keep fluids down.  Your pain gets very bad.  Your skin or the white part of your eyes turns yellow.  You have sudden swelling in your belly.  You throw up.  You feel dizzy or you pass out (faint).  Your blood sugar is high (over 300  mg/dL). Summary  Acute pancreatitis happens when the pancreas gets swollen.  This condition is often caused by alcohol abuse, drug abuse, or gallstones.  You will likely have to stay in the hospital for treatment. This information is not intended to replace advice given to you by your health care provider. Make sure you discuss any questions you have with your health care provider. Document Revised: 08/15/2018 Document Reviewed: 08/15/2018 Elsevier Patient Education  2020 [employer]   Chronic Pancreatitis  Chronic  pancreatitis is long-lasting inflammation and scarring of the pancreas. The pancreas is a gland that is located behind the stomach. It makes enzymes that help to digest food. The pancreas also releases hormones called glucagon and insulin, which help regulate blood sugar (glucose). Damage to the pancreas may affect digestion, cause pain in the upper abdomen and back, and cause diabetes. Inflammation can also irritate other organs in the abdomen near the pancreas. At first, pancreatitis may be sudden (acute). If you have several or prolonged episodes of acute pancreatitis, the condition can turn into chronic pancreatitis. What are the causes? The most common cause of this condition is alcohol abuse. Other causes include:  High (elevated) levels of triglycerides in the blood (hypertriglyceridemia).  Gallstones or other conditions that can block the tube that drains the pancreas (pancreatic duct).  Pancreatic cancer.  Cystic fibrosis.  Too much calcium in the blood (hypercalcemia), which may be caused by an overactive parathyroid gland (hyperparathyroidism).  Certain medicines.  Injury to the pancreas.  Infection.  Autoimmune pancreatitis. This is when the body's disease-fighting (immune) system attacks the pancreas.  Genes that are passed from parent to child (inherited). In some cases, the cause may not be known. What increases the risk? This condition is more likely to develop in:  Men.  People who are 29-60 years old.  People who have a family history of pancreatitis.  People who smoke tobacco.  People who drink large amounts of alcohol over a long period of time. What are the signs or symptoms? Symptoms of this condition may include:  Pain in the abdomen or upper back. Pain may get worse after eating.  Nausea and vomiting.  Fever.  Weight loss.  A change in the color and consistency of bowel movements, such as stools that are oily, fatty, or clay-colored. How is  this diagnosed? This condition is diagnosed based on your symptoms, your medical history, and a physical exam. You may have tests, such as:  Blood tests.  Stool samples.  Biopsy of the pancreas. This is the removal of a small amount of pancreas tissue to be tested in a lab.  Imaging tests, such as: ? X-rays. ? CT scan. ? MRI. ? Ultrasound. How is this treated? You may need to be treated at a hospital. Treatment may involve:  Resting the pancreas. You may need to stop eating and drinking for a few days to give your pancreas time to recover. During this time, you will be given IV fluids to keep you hydrated.  Controlling pain. You may be given pain medicines by mouth (orally) or as injections.  Improving digestion. You may be given: ? Medicines to replace your pancreatic enzymes. ? Vitamin supplements. ? A specific diet to follow. You may work with a diet and nutrition specialist (dietitian) to make an eating plan.  Surgery to: ? Clear the pancreatic ducts of any blockages, such as gallstones. ? Remove any fluid or damaged tissue from the pancreas. Other treatments may include:  Preventing diabetes.  Your health care provider may recommend that you: ? Get regular screening tests for diabetes. ? Monitor your blood glucose regularly.  Lifestyle changes, such as stopping alcohol use.  Steroid medicines, if your condition is caused by your immune system attacking your body's own tissues (autoimmune disease). Follow these instructions at home: Eating and drinking      Do not drink alcohol. If you need help quitting, ask your health care provider.  Follow a diet as told by your health care provider or dietitian, if this applies. This may include: ? Limiting how much fat you eat. ? Eating smaller meals more often. ? Avoiding caffeine.  Drink enough fluid to keep your urine pale yellow. General instructions  Take over-the-counter and prescription medicines only as told by  your health care provider. These include vitamin supplements.  Do not drive or use heavy machinery while taking prescription pain medicine.  If you are taking prescription pain medicine, take actions to prevent or treat constipation. Your health care provider may recommend that you: ? Take an over-the-counter or prescription medicine for constipation. ? Eat foods that are high in fiber such as whole grains and beans. ? Limit foods that are high in fat and processed sugars, such as fried or sweet foods.  Do not use any products that contain nicotine or tobacco, such as cigarettes and e-cigarettes. If you need help quitting, ask your health care provider.  If recommended by your health care provider, monitor your blood glucose at home.  Keep all follow-up visits as told by your health care provider. This is important. Contact a health care provider if:  You have pain that does not get better with medicine.  You have a fever.  You have sudden weight loss. Get help right away if:  Your pain suddenly gets worse.  You have sudden swelling in your abdomen.  You start to vomit often.  You vomit blood.  You have diarrhea that does not go away.  You have blood in your stool.  You become confused or you have trouble thinking clearly. Summary  Chronic pancreatitis is long-lasting inflammation and scarring of the pancreas. Damage to the pancreas may affect digestion, cause pain in the upper abdomen and back, and cause diabetes. Inflammation can also irritate other organs in the abdomen near the pancreas.  Common causes of this condition are alcohol abuse, gallstones, high (elevated) levels of triglycerides, and certain medicines.  This condition is sometimes treated at a hospital and may involve resting the pancreas, controlling pain, replacing enzymes, and avoiding alcohol. This information is not intended to replace advice given to you by your health care provider. Make sure you  discuss any questions you have with your health care provider. Document Revised: 06/01/2019 Document Reviewed: 06/25/2017 Elsevier Patient Education  2020 [employer]   Pancreatitis Eating Plan Pancreatitis is when your pancreas becomes irritated and swollen (inflamed). The pancreas is a small organ located behind your stomach. It helps your body digest food and regulate your blood sugar. Pancreatitis can affect how your body digests food, especially foods with fat. You may also have other symptoms such as abdominal pain or nausea. When you have pancreatitis, following a low-fat eating plan may help you manage symptoms and recover more quickly. Work with your health care provider or a diet and nutrition specialist (dietitian) to create an eating plan that is right for you. What are tips for following this plan? Reading food labels Use the information on food labels to help keep  track of how much fat you eat:  Check the serving size.  Look for the amount of total fat in grams (g) in one serving. ? Low-fat foods have 3 g of fat or less per serving. ? Fat-free foods have 0.5 g of fat or less per serving.  Keep track of how much fat you eat based on how many servings you eat. ? For example, if you eat two servings, the amount of fat you eat will be two times what is listed on the label. Shopping   Buy low-fat or nonfat foods, such as: ? Fresh, frozen, or canned fruits and vegetables. ? Grains, including pasta, bread, and rice. ? Lean meat, poultry, fish, and other protein foods. ? Low-fat or nonfat dairy.  Avoid buying bakery products and other sweets made with whole milk, butter, and eggs.  Avoid buying snack foods with added fat, such as anything with butter or cheese flavoring. Cooking  Remove skin from poultry, and remove extra fat from meat.  Limit the amount of fat and oil you use to 6 teaspoons or less per day.  Cook using low-fat methods, such as boiling, broiling,  grilling, steaming, or baking.  Use spray oil to cook. Add fat-free chicken broth to add flavor and moisture.  Avoid adding cream to thicken soups or sauces. Use other thickeners such as corn starch or tomato paste. Meal planning   Eat a low-fat diet as told by your dietitian. For most people, this means having no more than 55-65 grams of fat each day.  Eat small, frequent meals throughout the day. For example, you may have 5-6 small meals instead of 3 large meals.  Drink enough fluid to keep your urine pale yellow.  Do not drink alcohol. Talk to your health care provider if you need help stopping.  Limit how much caffeine you have, including black coffee, black and green tea, caffeinated soft drinks, and energy drinks. General information  Let your health care provider or dietitian know if you have unplanned weight loss on this eating plan.  You may be instructed to follow a clear liquid diet during a flare of symptoms. Talk with your health care provider about how to manage your diet during symptoms of a flare.  Take any vitamins or supplements as told by your health care provider.  Work with a Data processing manager, especially if you have other conditions such as obesity or diabetes mellitus. What foods should I avoid? Fruits Fried fruits. Fruits served with butter or cream. Vegetables Fried vegetables. Vegetables cooked with butter, cheese, or cream. Grains Biscuits, waffles, donuts, pastries, and croissants. Pies and cookies. Butter-flavored popcorn. Regular crackers. Meats and other protein foods Fatty cuts of meat. Poultry with skin. Organ meats. Bacon, sausage, and cold cuts. Whole eggs. Nuts and nut butters. Dairy Whole and 2% milk. Whole milk yogurt. Whole milk ice cream. Cream and half-and-half. Cream cheese. Sour cream. Cheese. Beverages Wine, beer, and liquor. The items listed above may not be a complete list of foods and beverages to avoid. Contact a dietitian for more  information. Summary  Pancreatitis can affect how your body digests food, especially foods with fat.  When you have pancreatitis, it is recommended that you follow a low-fat eating plan to help you recover more quickly and manage symptoms. For most people, this means limiting fat to no more than 55-65 grams per day.  Do not drink alcohol. Limit the amount of caffeine you have, and drink enough fluid to keep your urine  pale yellow. This information is not intended to replace advice given to you by your health care provider. Make sure you discuss any questions you have with your health care provider. Document Revised: 02/16/2019 Document Reviewed: 02/01/2018 Elsevier Patient Education  2020 [employer].

## 2020-06-14 NOTE — Progress Notes (Signed)
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 Will swab

## 2020-06-14 NOTE — ED Notes (Signed)
No sitter at this time due to lack of available sitters.  Pt laying in hospital bed, supine, calm. When asked about if pt still SI, pt answers "I dont know, Im debating" when asked again, pt answers "I dont know, I guess so, yea."  Lights left on in room, door and blinds open, pt visualized from RN station.

## 2020-06-14 NOTE — Progress Notes (Signed)
Received report from RN in the ED.  Awaiting his arrival to Room 1612.

## 2020-06-14 NOTE — ED Notes (Signed)
Admitting provider messaged regarding pts NPO status and pending PO meds ordered. Awaiting response.

## 2020-06-14 NOTE — Progress Notes (Signed)
PROGRESS NOTE    Patient: Gary [name]                            PCP: Patient, No Pcp Per                    DOB: Mar 27, 1986            DOA: 06/13/2020 [id number]             DOS: 06/14/2020, 12:14 PM   LOS: 1 day   Date of Service: The patient was seen and examined on 06/14/2020  Subjective:   The patient was seen and examined this Am. Hemodynamically stable, awake alert oriented, following command, participate in exam Still reporting intermittent suicidal thoughts but no homicidal thoughts. Stating, he would like to be detoxed would like to stop drinking  Brief Narrative:   Gary [name] is a 34 y.o. male with history of alcohol use disorder, HIV, homelessness, prior GI bleeds, who presents with multiple issues. Including alcohol abuse, overuse in anticipation of harming himself.  ED evaluation: CMP with potassium 2.7 but otherwise unremarkable, LFTs notable for AST 157 and ALT 100 with normal bilirubin, call level 293, lipase 2004. Compared to prior ED visit from July 31 labs largely unchanged with exception of increase in LFTs. Abdominal ultrasound was also obtained which showed fatty liver findings, and chest x-ray which showed no active chest disease.   Assessment & Plan:   Active Problems:   HIV disease (HCC)   Alcohol dependence (HCC)   Major depressive disorder, recurrent severe without psychotic features (HCC)   Transaminitis   Alcohol withdrawal (HCC)   Alcoholic hepatitis without ascites   Pancreatitis   Suicidal ideation    Suicidal ideation Patient endorsing active suicidal ideation with plan to drink himself to death. Behavioral health has already been consulted by ED providers, they will see patient was once he is no longer intoxicated. -Suicide precautions -One-to-one sitter -Psych consulted, no notes in the chart yet, he was last evaluated by psych on 06/07/2020   Alcohol withdrawal Alcohol dependence / Alcoholic hepatitis /  Pancreatitis -Alcohol on admission 293 elevated -We will continue to monitor closely, monitoring LFTs, Hepatitis panel checked last in November 2020 and was negative. -N.p.o., advance diet as tolerated -Lactated Ringer's at 150 cc an hour -IV Dilaudid for pain as needed -CIWA protocol with Ativan given elevated LFTs, consider transition to longer acting benzodiazepine pending clinical course -Folic acid, thiamine, multivitamin -Pantoprazole 40 mg IV twice daily -Trend LFTs -Offer treatment for alcohol use disorder prior to discharge, had preliminary discussion regarding naloxone with the patient  Pancreatitis -Monitoring lipase 2004 >>  -We will monitor closely, continue IV fluid resuscitation with LR at 150 mL/h -N.p.o.  Hypokalemia Potassium 2.7 on arrival, has been ordered for 20 mEq IV potassium and 40 mEq p.o. Trend BMP and replete as needed.  HIV -Continue Genvoya - Last saw infectious diseases in April of this year, at that time had undetectable viral load.   Code Status: Full Code, unconfirmed DVT Prophylaxis: Lovenox Family Communication: none Disposition Plan: Inpatient    Consultants: Psych        Procedures:   No admission procedures for hospital encounter.     Antimicrobials:  Anti-infectives (From admission, onward)   Start     Dose/Rate Route Frequency Ordered Stop   06/14/20 0800  elvitegravir-cobicistat-emtricitabine-tenofovir (GENVOYA) 150-150-200-10 MG tablet 1 tablet  Discontinue     1 tablet Oral Daily with breakfast 06/13/20 2242         Medication:  . elvitegravir-cobicistat-emtricitabine-tenofovir  1 tablet Oral Q breakfast  . enoxaparin (LOVENOX) injection  40 mg Subcutaneous Q24H  . folic acid  1 mg Oral Daily  . folic acid  1 mg Intravenous Daily  . multivitamin with minerals  1 tablet Oral Daily  . nicotine  21 mg Transdermal Daily  . [START ON 06/15/2020] pantoprazole  40 mg Oral BID  . thiamine  100 mg Oral Daily     acetaminophen **OR** acetaminophen, HYDROmorphone (DILAUDID) injection, LORazepam **OR** LORazepam, ondansetron **OR** ondansetron (ZOFRAN) IV, polyethylene glycol   Objective:   Vitals:   06/14/20 0819 06/14/20 0900 06/14/20 0933 06/14/20 1010  BP: 112/76 112/76 115/64 120/89  Pulse: 82 62 72 72  Resp: 19  13 13   Temp: 98.1 F (36.7 C)  97.8 F (36.6 C) 97.8 F (36.6 C)  TempSrc:   Oral Oral  SpO2: 100%  100% 100%    Intake/Output Summary (Last 24 hours) at 06/14/2020 1214 Last data filed at 06/14/2020 4098 Gross per 24 hour  Intake 1291.58 ml  Output --  Net 1291.58 ml   There were no vitals filed for this visit.   Examination:   Physical Exam  Constitution:  Alert, cooperative, no distress,  Appears calm and comfortable  Psychiatric: Normal and stable mood and affect, cognition intact,   HEENT: Normocephalic, PERRL, otherwise with in Normal limits  Chest:Chest symmetric Cardio vascular:  S1/S2, RRR, No murmure, No Rubs or Gallops  pulmonary: Clear to auscultation bilaterally, respirations unlabored, negative wheezes / crackles Abdomen: Soft, non-tender, non-distended, bowel sounds,no masses, no organomegaly Muscular skeletal: Limited exam - in bed, able to move all 4 extremities, Normal strength,  Neuro: CNII-XII intact. , normal motor and sensation, reflexes intact  Extremities: No pitting edema lower extremities, +2 pulses  Skin: Dry, warm to touch, negative for any Rashes, No open wounds Wounds: per nursing documentation    ------------------------------------------------------------------------------------------------------------------------------------------    LABs:  CBC Latest Ref Rng & Units 06/14/2020 06/13/2020 06/09/2020  WBC 4.0 - 10.5 K/uL 2.8(L) 3.8(L) 3.8(L)  Hemoglobin 13.0 - 17.0 g/dL 11.5(L) 12.5(L) 14.5  Hematocrit 39 - 52 % 33.7(L) 35.8(L) 41.9  Platelets 150 - 400 K/uL 144(L) 159 90(L)   CMP Latest Ref Rng & Units 06/14/2020 06/13/2020 06/09/2020   Glucose 70 - 99 mg/dL 73 119(J) 88  BUN 6 - 20 mg/dL <4(N) <8(G) 6  Creatinine 0.61 - 1.24 mg/dL 9.56(O) 1.30 8.65(H)  Sodium 135 - 145 mmol/L 140 137 135  Potassium 3.5 - 5.1 mmol/L 3.6 2.7(LL) 3.4(L)  Chloride 98 - 111 mmol/L 103 101 98  CO2 22 - 32 mmol/L 27 24 23   Calcium 8.9 - 10.3 mg/dL 8.1(L) 8.5(L) 8.9  Total Protein 6.5 - 8.1 g/dL 5.9(L) 7.2 6.6  Total Bilirubin 0.3 - 1.2 mg/dL 0.5 0.7 8.4(O)  Alkaline Phos 38 - 126 U/L 42 49 49  AST 15 - 41 U/L 154(H) 157(H) 54(H)  ALT 0 - 44 U/L 95(H) 100(H) 70(H)       Micro Results Recent Results (from the past 240 hour(s))  SARS Coronavirus 2 by RT PCR (hospital order, performed in [facility] hospital lab) Nasopharyngeal Nasopharyngeal Swab     Status: None   Collection Time: 06/07/20  9:03 PM   Specimen: Nasopharyngeal Swab  Result Value Ref Range Status   SARS Coronavirus 2 NEGATIVE NEGATIVE Final  Comment: (NOTE) SARS-CoV-2 target nucleic acids are NOT DETECTED.  The SARS-CoV-2 RNA is generally detectable in upper and lower respiratory specimens during the acute phase of infection. The lowest concentration of SARS-CoV-2 viral copies this assay can detect is 250 copies / mL. A negative result does not preclude SARS-CoV-2 infection and should not be used as the sole basis for treatment or other patient management decisions.  A negative result may occur with improper specimen collection / handling, submission of specimen other than nasopharyngeal swab, presence of viral mutation(s) within the areas targeted by this assay, and inadequate number of viral copies (<250 copies / mL). A negative result must be combined with clinical observations, patient history, and epidemiological information.  Fact Sheet for Patients:   BoilerBrush.com.cy  Fact Sheet for Healthcare Providers: https://pope.com/  This test is not yet approved or  cleared by the [place] FDA and has been  authorized for detection and/or diagnosis of SARS-CoV-2 by FDA under an Emergency Use Authorization (EUA).  This EUA will remain in effect (meaning this test can be used) for the duration of the COVID-19 declaration under Section 564(b)(1) of the Act, 21 U.S.C. section 360bbb-3(b)(1), unless the authorization is terminated or revoked sooner.  Performed at [facility], [address] [place], [place] [address]   SARS Coronavirus 2 by RT PCR (hospital order, performed in [facility] hospital lab) Nasopharyngeal Nasopharyngeal Swab     Status: None   Collection Time: 06/14/20 12:25 AM   Specimen: Nasopharyngeal Swab  Result Value Ref Range Status   SARS Coronavirus 2 NEGATIVE NEGATIVE Final    Comment: (NOTE) SARS-CoV-2 target nucleic acids are NOT DETECTED.  The SARS-CoV-2 RNA is generally detectable in upper and lower respiratory specimens during the acute phase of infection. The lowest concentration of SARS-CoV-2 viral copies this assay can detect is 250 copies / mL. A negative result does not preclude SARS-CoV-2 infection and should not be used as the sole basis for treatment or other patient management decisions.  A negative result may occur with improper specimen collection / handling, submission of specimen other than nasopharyngeal swab, presence of viral mutation(s) within the areas targeted by this assay, and inadequate number of viral copies (<250 copies / mL). A negative result must be combined with clinical observations, patient history, and epidemiological information.  Fact Sheet for Patients:   BoilerBrush.com.cy  Fact Sheet for Healthcare Providers: https://pope.com/  This test is not yet approved or  cleared by the [place] FDA and has been authorized for detection and/or diagnosis of SARS-CoV-2 by FDA under an Emergency Use Authorization (EUA).  This EUA will remain in effect (meaning this test can be  used) for the duration of the COVID-19 declaration under Section 564(b)(1) of the Act, 21 U.S.C. section 360bbb-3(b)(1), unless the authorization is terminated or revoked sooner.  Performed at [facility], [address] [place], [place] [address]     Radiology Reports US Abdomen Limited  Result Date: 06/13/2020 CLINICAL DATA:  Upper abdominal pain. EXAM: ULTRASOUND ABDOMEN LIMITED RIGHT UPPER QUADRANT COMPARISON:  None. FINDINGS: Gallbladder: No gallstones or wall thickening visualized (2.2 mm). No sonographic Murphy sign noted by sonographer. Common bile duct: Diameter: 5.3 mm Liver: The liver is enlarged and measures 19.1 cm in length. No focal lesion identified. Diffusely increased echogenicity of the liver parenchyma is noted. Portal vein is patent on color Doppler imaging with normal direction of blood flow towards the liver. Other: None. IMPRESSION: Enlarged, fatty liver. Electronically  Signed   By: Aram Candela M.D.   On: 06/13/2020 23:56   DG Chest Port 1 View  Result Date: 06/13/2020 CLINICAL DATA:  Tremors EXAM: PORTABLE CHEST 1 VIEW COMPARISON:  05/04/2020 FINDINGS: The heart size and mediastinal contours are within normal limits. Both lungs are clear. The visualized skeletal structures are unremarkable. IMPRESSION: No active disease. Electronically Signed   By: Jasmine Pang M.D.   On: 06/13/2020 22:19   DG Knee Complete 4 Views Right  Result Date: 05/18/2020 CLINICAL DATA:  Pain following fall EXAM: RIGHT KNEE - COMPLETE 4+ VIEW COMPARISON:  None. FINDINGS: Frontal, lateral, and bilateral oblique views were obtained. There is no appreciable fracture or dislocation. There is a moderate joint effusion. There is mild narrowing medially in the patellofemoral joint region. There is no erosive change. There is mild spurring along the anterior superior patella. IMPRESSION: Moderate joint effusion. No fracture or dislocation. Mild narrowing of the medial compartment  and patellofemoral joints. Mild spurring along the anterior superior patella likely represents distal quadriceps tendinosis. Electronically Signed   By: Bretta Bang III M.D.   On: 05/18/2020 15:27   DG Hand Complete Left  Result Date: 06/13/2020 CLINICAL DATA:  Left hand pain and swelling. Redness. EXAM: LEFT HAND - COMPLETE 3+ VIEW COMPARISON:  Radiograph 07/19/2017 FINDINGS: There is no evidence of fracture or dislocation. The digits are held in flexion on all views. There is no evidence of arthropathy or other focal bone abnormality. Mild soft tissue edema overlies the dorsum of the metacarpals. No soft tissue air or radiopaque foreign body. IMPRESSION: Soft tissue edema over the dorsum of the metacarpals. No acute osseous abnormality. Electronically Signed   By: Narda Rutherford M.D.   On: 06/13/2020 22:04    SIGNED: Kendell Bane, MD, FACP, FHM. [facility],  Pager (please use [email or web address] to page/text)  If 7PM-7AM, please contact night-coverage [email or web address] 06/14/2020, 12:14 PM

## 2020-06-14 NOTE — Progress Notes (Addendum)
Gary [name] has on his Left foot a scab on his left great toe.On his right foot the the 2nd toenail is black. Patient states he had skin tags on his penis that he cut off, and one grew back that is black and hard. 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 Will re- swab for MRSA, he was put on isolation for contact

## 2020-06-15 DIAGNOSIS — F332 Major depressive disorder, recurrent severe without psychotic features: Secondary | ICD-10-CM

## 2020-06-15 DIAGNOSIS — K7010 Alcoholic hepatitis without ascites: Secondary | ICD-10-CM

## 2020-06-15 LAB — COMPREHENSIVE METABOLIC PANEL
ALT: 96 U/L — ABNORMAL HIGH (ref 0–44)
AST: 111 U/L — ABNORMAL HIGH (ref 15–41)
Albumin: 3.7 g/dL (ref 3.5–5.0)
Alkaline Phosphatase: 52 U/L (ref 38–126)
Anion gap: 15 (ref 5–15)
BUN: <5 mg/dL — ABNORMAL LOW (ref 6–20)
CO2: 21 mmol/L — ABNORMAL LOW (ref 22–32)
Calcium: 9 mg/dL (ref 8.9–10.3)
Chloride: 98 mmol/L (ref 98–111)
Creatinine, Ser: 0.46 mg/dL — ABNORMAL LOW (ref 0.61–1.24)
GFR calc Af Amer: >60 mL/min (ref >60)
GFR calc non Af Amer: >60 mL/min (ref >60)
Glucose, Bld: 67 mg/dL — ABNORMAL LOW (ref 70–99)
Potassium: 3.8 mmol/L (ref 3.5–5.1)
Sodium: 134 mmol/L — ABNORMAL LOW (ref 135–145)
Total Bilirubin: 1.5 mg/dL — ABNORMAL HIGH (ref 0.3–1.2)
Total Protein: 6.9 g/dL (ref 6.5–8.1)

## 2020-06-15 LAB — GLUCOSE, CAPILLARY
Glucose-Capillary: 110 mg/dL — ABNORMAL HIGH (ref 70–99)
Glucose-Capillary: 55 mg/dL — ABNORMAL LOW (ref 70–99)
Glucose-Capillary: 60 mg/dL — ABNORMAL LOW (ref 70–99)
Glucose-Capillary: 110 mg/dL — ABNORMAL HIGH (ref 70–99)
Glucose-Capillary: 92 mg/dL (ref 70–99)
Glucose-Capillary: 93 mg/dL (ref 70–99)

## 2020-06-15 LAB — MRSA PCR SCREENING: MRSA by PCR: NEGATIVE

## 2020-06-15 LAB — LIPASE, BLOOD: Lipase: 1035 U/L — ABNORMAL HIGH (ref 11–51)

## 2020-06-15 MED ORDER — DEXTROSE 50 % IV SOLN: 12.5000 g | INTRAVENOUS | Status: AC

## 2020-06-15 MED ORDER — DEXTROSE 50 % IV SOLN
12.5000 g | INTRAVENOUS | Status: DC | PRN
  Administered 2020-06-15: 12.5 g via INTRAVENOUS

## 2020-06-15 MED ORDER — DEXTROSE 50 % IV SOLN
INTRAVENOUS | Status: AC
  Administered 2020-06-15: 12.5 g via INTRAVENOUS
  Filled 2020-06-15: qty 50

## 2020-06-15 MED ORDER — DEXTROSE IN LACTATED RINGERS 5 % IV SOLN: INTRAVENOUS | Status: DC

## 2020-06-15 NOTE — Consult Note (Signed)
Telepsych Consultation   Reason for Consult: ''suicidal ideations.'' Referring Physician:  Nevin BloodgoodSeyed Shahmehdi, MD Location of Patient: [id number] Location of Provider: [facility]  Patient Identification: Gary [name] MRN:  [id number] Principal Diagnosis: Alcohol abuse with alcohol-induced mood disorder (HCC) Diagnosis:  Principal Problem:   Alcohol abuse with alcohol-induced mood disorder (HCC) Active Problems:   HIV disease (HCC)   Alcohol dependence (HCC)   Major depressive disorder, recurrent severe without psychotic features (HCC)   Transaminitis   Alcohol withdrawal (HCC)   Alcoholic hepatitis without ascites   Pancreatitis   Suicidal ideation   Total Time spent with patient: 1 hour  Subjective:   Gary [name] is a 34 y.o. male patient admitted with alcohol withdrawal.  HPI:  Patient who reports history of Depression, HIV, homelessness, prior GI bleeds and Alcohol use disorder-severe. He was admitted with alcohol withdrawal symptoms and multiple other issues. Today's consult was initiated due to ''suicidal ideation'', however,patient reports that last time he felt suicidal is more than a week ago. He is requesting to get on disability due to financial problem and homelessness. Also, he is seeking for referral to Alcohol rehab facility due to his inability to quit drinking on his own. He reports occasional depressive symptoms due to being abandoned by his friends and family because of excessive alcohol consumption. However, he denies psychosis, delusions, self harming thoughts or other illicit drugs of abuse.    Past Psychiatric History: as above  Risk to Self:  denies Risk to Others:  denies [facility] Prior Outpatient Therapy:  None reported by the patient  Past Medical History:  Past Medical History:  Diagnosis Date  . Alcohol abuse   . HIV (human immunodeficiency virus infection) (HCC)   . Subdural hematoma [facility](HCC)     Past Surgical  History:  Procedure Laterality Date  . INCISION AND DRAINAGE PERIRECTAL ABSCESS N/A 09/24/2016   Procedure: IRRIGATION AND DEBRIDEMENT PERIRECTAL ABSCESS;  Surgeon: Ricarda Frameharles Woodham, MD;  Location: [facility] ORS;  Service: General;  Laterality: N/A;  . none     Family History:  Family History  Problem Relation Age of Onset  . Alcohol abuse Mother   . Alcohol abuse Father    Family Psychiatric  History:  Social History:  Social History   Substance and Sexual Activity  Alcohol Use Yes  . Alcohol/week: 3.0 standard drinks  . Types: 3 Cans of beer per week   Comment: 24 oz     Social History   Substance and Sexual Activity  Drug Use Not Currently  . Types: Marijuana, Methamphetamines   Comment: last used 04/10/2019    Social History   Socioeconomic History  . Marital status: Single    Spouse name: Not on file  . Number of children: Not on file  . Years of education: Not on file  . Highest education level: Not on file  Occupational History  . Occupation: unemployed  Tobacco Use  . Smoking status: Former Smoker    Packs/day: 0.50    Types: Cigarettes  . Smokeless tobacco: Never Used  . Tobacco comment: unable to smoke while incarcerated 6+ months 02/13/20  Vaping Use  . Vaping Use: Never used  Substance and Sexual Activity  . Alcohol use: Yes    Alcohol/week: 3.0 standard drinks    Types: 3 Cans of beer per week    Comment: 24 oz  . Drug use: Not Currently    Types: Marijuana, Methamphetamines    Comment: last used  04/10/2019  . Sexual activity: Not Currently    Comment: unable to offer condoms while incarcerated  Other Topics Concern  . Not on file  Social History Narrative   "Currently living on the streets"   Independent at baseline   Social Determinants of Health   Financial Resource Strain:   . Difficulty of Paying Living Expenses:   Food Insecurity:   . Worried About Programme researcher, broadcasting/film/video in the Last Year:   . Barista in the Last Year:   Transportation  Needs:   . Freight forwarder (Medical):   Marland Kitchen Lack of Transportation (Non-Medical):   Physical Activity:   . Days of Exercise per Week:   . Minutes of Exercise per Session:   Stress:   . Feeling of Stress :   Social Connections:   . Frequency of Communication with Friends and Family:   . Frequency of Social Gatherings with Friends and Family:   . Attends Religious Services:   . Active Member of Clubs or Organizations:   . Attends Banker Meetings:   Marland Kitchen Marital Status:    Additional Social History:    Allergies:   Allergies  Allergen Reactions  . Tegretol [Carbamazepine] Other (See Comments)    Caused vertigo for 2 days after taking it  . Caffeine Palpitations    Labs:  Results for orders placed or performed during the hospital encounter of 06/13/20 (from the past 48 hour(s))  Basic metabolic panel     Status: Abnormal   Collection Time: 06/13/20  6:45 PM  Result Value Ref Range   Sodium 137 135 - 145 mmol/L   Potassium 2.7 (LL) 3.5 - 5.1 mmol/L    Comment: CRITICAL RESULT CALLED TO, READ BACK BY AND VERIFIED WITH: JACKSON,C RN @2029  ON 06/13/20 JACKSON,K    Chloride 101 98 - 111 mmol/L   CO2 24 22 - 32 mmol/L   Glucose, Bld 130 (H) 70 - 99 mg/dL    Comment: Glucose reference range applies only to samples taken after fasting for at least 8 hours.   BUN <5 (L) 6 - 20 mg/dL   Creatinine, Ser 1.61 0.61 - 1.24 mg/dL   Calcium 8.5 (L) 8.9 - 10.3 mg/dL   GFR calc non Af Amer >60 >60 mL/min   GFR calc Af Amer >60 >60 mL/min   Anion gap 12 5 - 15    Comment: Performed at [facility], [address], [place], [place] [address]  CBC     Status: Abnormal   Collection Time: 06/13/20  6:45 PM  Result Value Ref Range   WBC 3.8 (L) 4.0 - 10.5 K/uL   RBC 3.70 (L) 4.22 - 5.81 MIL/uL   Hemoglobin 12.5 (L) 13.0 - 17.0 g/dL   HCT 54.0 (L) 39 - 52 %   MCV 96.8 80.0 - 100.0 fL   MCH 33.8 26.0 - 34.0 pg   MCHC 34.9 30.0 - 36.0 g/dL   RDW 98.1 19.1 -  47.8 %   Platelets 159 150 - 400 K/uL   nRBC 0.0 0.0 - 0.2 %    Comment: Performed at [facility], [address], [place], [place] [address]  Magnesium     Status: None   Collection Time: 06/13/20  9:54 PM  Result Value Ref Range   Magnesium 2.3 1.7 - 2.4 mg/dL    Comment: Performed at [facility], [address], [place], [place] [address]  Hepatic function  panel     Status: Abnormal   Collection Time: 06/13/20  9:54 PM  Result Value Ref Range   Total Protein 7.2 6.5 - 8.1 g/dL   Albumin 3.9 3.5 - 5.0 g/dL   AST 562 (H) 15 - 41 U/L   ALT 100 (H) 0 - 44 U/L   Alkaline Phosphatase 49 38 - 126 U/L   Total Bilirubin 0.7 0.3 - 1.2 mg/dL   Bilirubin, Direct 0.2 0.0 - 0.2 mg/dL   Indirect Bilirubin 0.5 0.3 - 0.9 mg/dL    Comment: Performed at [facility], [address], [place], [place] [address]  Lipase, blood     Status: Abnormal   Collection Time: 06/13/20  9:54 PM  Result Value Ref Range   Lipase 2,004 (H) 11 - 51 U/L    Comment: RESULTS CONFIRMED BY MANUAL DILUTION Performed at [facility], [address] [place], [place] [address]   Ethanol     Status: Abnormal   Collection Time: 06/13/20  9:54 PM  Result Value Ref Range   Alcohol, Ethyl (B) 293 (H) <10 mg/dL    Comment: (NOTE) Lowest detectable limit for serum alcohol is 10 mg/dL.  For medical purposes only. Performed at [facility], [address], [place], [place] [address]   Rapid urine drug screen (hospital performed)     Status: Abnormal   Collection Time: 06/13/20  9:55 PM  Result Value Ref Range   Opiates NONE DETECTED NONE DETECTED   Cocaine NONE DETECTED NONE DETECTED   Benzodiazepines POSITIVE (A) NONE DETECTED   Amphetamines NONE DETECTED NONE DETECTED   Tetrahydrocannabinol NONE DETECTED NONE DETECTED   Barbiturates NONE DETECTED NONE DETECTED    Comment: (NOTE) DRUG SCREEN FOR MEDICAL PURPOSES ONLY.   IF CONFIRMATION IS NEEDED FOR ANY PURPOSE, NOTIFY LAB WITHIN 5 DAYS.  LOWEST DETECTABLE LIMITS FOR URINE DRUG SCREEN Drug Class                     Cutoff (ng/mL) Amphetamine and metabolites    1000 Barbiturate and metabolites    200 Benzodiazepine                 200 Tricyclics and metabolites     300 Opiates and metabolites        300 Cocaine and metabolites        300 THC                            50 Performed at [facility], [address], [place], [place] [address]   Phosphorus     Status: None   Collection Time: 06/13/20 10:02 PM  Result Value Ref Range   Phosphorus 4.6 2.5 - 4.6 mg/dL    Comment: Performed at [facility], [address], [place], [place] [address]  SARS Coronavirus 2 by RT PCR (hospital order, performed in [facility] hospital lab) Nasopharyngeal Nasopharyngeal Swab     Status: None   Collection Time: 06/14/20 12:25 AM   Specimen: Nasopharyngeal Swab  Result Value Ref Range   SARS Coronavirus 2 NEGATIVE NEGATIVE    Comment: (NOTE) SARS-CoV-2 target nucleic acids are NOT DETECTED.  The SARS-CoV-2 RNA is generally detectable in upper and lower respiratory specimens during the acute phase of infection. The lowest concentration of SARS-CoV-2 viral copies this assay can detect is 250 copies / mL. A negative result does not  preclude SARS-CoV-2 infection and should not be used as the sole basis for treatment or other patient management decisions.  A negative result may occur with improper specimen collection / handling, submission of specimen other than nasopharyngeal swab, presence of viral mutation(s) within the areas targeted by this assay, and inadequate number of viral copies (<250 copies / mL). A negative result must be combined with clinical observations, patient history, and epidemiological information.  Fact Sheet for Patients:   BoilerBrush.com.cy  Fact Sheet for Healthcare  Providers: https://pope.com/  This test is not yet approved or  cleared by the [place] FDA and has been authorized for detection and/or diagnosis of SARS-CoV-2 by FDA under an Emergency Use Authorization (EUA).  This EUA will remain in effect (meaning this test can be used) for the duration of the COVID-19 declaration under Section 564(b)(1) of the Act, 21 U.S.C. section 360bbb-3(b)(1), unless the authorization is terminated or revoked sooner.  Performed at [facility], [address] [place], [place] [address]   Comprehensive metabolic panel     Status: Abnormal   Collection Time: 06/14/20  5:27 AM  Result Value Ref Range   Sodium 140 135 - 145 mmol/L   Potassium 3.6 3.5 - 5.1 mmol/L    Comment: DELTA CHECK NOTED NO VISIBLE HEMOLYSIS    Chloride 103 98 - 111 mmol/L   CO2 27 22 - 32 mmol/L   Glucose, Bld 73 70 - 99 mg/dL    Comment: Glucose reference range applies only to samples taken after fasting for at least 8 hours.   BUN <5 (L) 6 - 20 mg/dL   Creatinine, Ser 2.44 (L) 0.61 - 1.24 mg/dL   Calcium 8.1 (L) 8.9 - 10.3 mg/dL   Total Protein 5.9 (L) 6.5 - 8.1 g/dL   Albumin 3.3 (L) 3.5 - 5.0 g/dL   AST 010 (H) 15 - 41 U/L   ALT 95 (H) 0 - 44 U/L   Alkaline Phosphatase 42 38 - 126 U/L   Total Bilirubin 0.5 0.3 - 1.2 mg/dL   GFR calc non Af Amer >60 >60 mL/min   GFR calc Af Amer >60 >60 mL/min   Anion gap 10 5 - 15    Comment: Performed at [facility], [address], [place], [place] [address]  CBC     Status: Abnormal   Collection Time: 06/14/20  5:27 AM  Result Value Ref Range   WBC 2.8 (L) 4.0 - 10.5 K/uL   RBC 3.41 (L) 4.22 - 5.81 MIL/uL   Hemoglobin 11.5 (L) 13.0 - 17.0 g/dL   HCT 66.4 (L) 39 - 52 %   MCV 98.8 80.0 - 100.0 fL   MCH 33.7 26.0 - 34.0 pg   MCHC 34.1 30.0 - 36.0 g/dL   RDW 40.3 47.4 - 25.9 %   Platelets 144 (L) 150 - 400 K/uL    Comment: Immature Platelet Fraction may  be clinically indicated, consider ordering this additional test [id number]    nRBC 0.0 0.0 - 0.2 %    Comment: Performed at [facility], [address], [place], [place] [address]  Comprehensive metabolic panel     Status: Abnormal   Collection Time: 06/15/20  5:56 AM  Result Value Ref Range   Sodium 134 (L) 135 - 145 mmol/L   Potassium 3.8 3.5 - 5.1 mmol/L   Chloride 98 98 - 111 mmol/L   CO2 21 (L) 22 - 32 mmol/L   Glucose, Bld 67 (  L) 70 - 99 mg/dL    Comment: Glucose reference range applies only to samples taken after fasting for at least 8 hours.   BUN <5 (L) 6 - 20 mg/dL   Creatinine, Ser 6.76 (L) 0.61 - 1.24 mg/dL   Calcium 9.0 8.9 - 72.0 mg/dL   Total Protein 6.9 6.5 - 8.1 g/dL   Albumin 3.7 3.5 - 5.0 g/dL   AST 947 (H) 15 - 41 U/L   ALT 96 (H) 0 - 44 U/L   Alkaline Phosphatase 52 38 - 126 U/L   Total Bilirubin 1.5 (H) 0.3 - 1.2 mg/dL   GFR calc non Af Amer >60 >60 mL/min   GFR calc Af Amer >60 >60 mL/min   Anion gap 15 5 - 15    Comment: Performed at [facility], [address], [place], [place] [address]  Glucose, capillary     Status: Abnormal   Collection Time: 06/15/20  8:37 AM  Result Value Ref Range   Glucose-Capillary 55 (L) 70 - 99 mg/dL    Comment: Glucose reference range applies only to samples taken after fasting for at least 8 hours.  MRSA PCR Screening     Status: None   Collection Time: 06/15/20  8:46 AM   Specimen: Nasopharyngeal  Result Value Ref Range   MRSA by PCR NEGATIVE NEGATIVE    Comment:        The GeneXpert MRSA Assay (FDA approved for NASAL specimens only), is one component of a comprehensive MRSA colonization surveillance program. It is not intended to diagnose MRSA infection nor to guide or monitor treatment for MRSA infections. Performed at [facility], [address], [place], [place] [address]   Glucose, capillary     Status: Abnormal   Collection Time: 06/15/20  8:59 AM   Result Value Ref Range   Glucose-Capillary 110 (H) 70 - 99 mg/dL    Comment: Glucose reference range applies only to samples taken after fasting for at least 8 hours.    Medications:  Current Facility-Administered Medications  Medication Dose Route Frequency Provider Last Rate Last Admin  . acetaminophen (TYLENOL) tablet 650 mg  650 mg Oral Q6H PRN Venora Maples, MD       Or  . acetaminophen (TYLENOL) suppository 650 mg  650 mg Rectal Q6H PRN Venora Maples, MD      . dextrose 5 % in lactated ringers infusion   Intravenous Continuous Shahmehdi, Seyed A, MD      . dextrose 50 % solution 12.5 g  12.5 g Intravenous PRN Nevin Bloodgood A, MD   12.5 g at 06/15/20 1211  . elvitegravir-cobicistat-emtricitabine-tenofovir (GENVOYA) 150-150-200-10 MG tablet 1 tablet  1 tablet Oral Q breakfast Venora Maples, MD   1 tablet at 06/15/20 [phone]  . enoxaparin (LOVENOX) injection 40 mg  40 mg Subcutaneous Q24H Venora Maples, MD   40 mg at 06/15/20 0911  . folic acid (FOLVITE) tablet 1 mg  1 mg Oral Daily Venora Maples, MD   1 mg at 06/15/20 0801  . folic acid injection 1 mg  1 mg Intravenous Daily Venora Maples, MD   1 mg at 06/13/20 2229  . HYDROmorphone (DILAUDID) injection 0.5-1 mg  0.5-1 mg Intravenous Q2H PRN Venora Maples, MD   1 mg at 06/15/20 1211  . LORazepam (ATIVAN) tablet 1-4 mg  1-4 mg Oral Q1H PRN Venora Maples, MD   1 mg at 06/15/20 1134  Or  . LORazepam (ATIVAN) injection 1-4 mg  1-4 mg Intravenous Q1H PRN Venora Maples, MD   2 mg at 06/15/20 0550  . multivitamin with minerals tablet 1 tablet  1 tablet Oral Daily Venora Maples, MD   1 tablet at 06/15/20 0802  . nicotine (NICODERM CQ - dosed in mg/24 hours) patch 21 mg  21 mg Transdermal Daily Venora Maples, MD   21 mg at 06/15/20 0802  . ondansetron (ZOFRAN) tablet 4 mg  4 mg Oral Q6H PRN Venora Maples, MD       Or  . ondansetron [facility]) injection 4 mg  4 mg Intravenous Q6H PRN  Venora Maples, MD      . pantoprazole (PROTONIX) EC tablet 40 mg  40 mg Oral BID Venora Maples, MD   40 mg at 06/15/20 0759  . polyethylene glycol (MIRALAX / GLYCOLAX) packet 17 g  17 g Oral Daily PRN Venora Maples, MD      . thiamine tablet 100 mg  100 mg Oral Daily Venora Maples, MD   100 mg at 06/15/20 4970    Musculoskeletal: Strength & Muscle Tone: not tested Gait & Station: not tested Patient leans: N/A  Psychiatric Specialty Exam: Physical Exam Psychiatric:        Attention and Perception: Attention normal.        Mood and Affect: Mood is depressed. Affect is flat.        Speech: Speech normal.        Behavior: Behavior is cooperative.        Thought Content: Thought content normal.        Cognition and Memory: Cognition and memory normal.        Judgment: Judgment is impulsive.     Review of Systems  Constitutional: Positive for activity change, appetite change and fatigue.  HENT: Negative.   Eyes: Negative.   Neurological: Positive for tremors.  Psychiatric/Behavioral: Positive for dysphoric mood.    Blood pressure 121/73, pulse 74, temperature 98.5 F (36.9 C), temperature source Oral, resp. rate 18, height 6\' 1"  (1.854 m), weight 68 kg, SpO2 96 %.Body mass index is 19.79 kg/m.  General Appearance: Casual  Eye Contact:  Fair  Speech:  Clear and Coherent and Slow  Volume:  Decreased  Mood:  Dysphoric  Affect:  Constricted  Thought Process:  Coherent, Goal Directed and Linear  Orientation:  Full (Time, Place, and Person)  Thought Content:  Logical  Suicidal Thoughts:  No  Homicidal Thoughts:  No  Memory:  Immediate;   Good Recent;   Good Remote;   Good  Judgement:  Fair  Insight:  Shallow  Psychomotor Activity:  Psychomotor Retardation  Concentration:  Concentration: Fair and Attention Span: Fair  Recall:  Good  Fund of Knowledge:  Good  Language:  Good  Akathisia:  No  Handed:  Right  AIMS (if indicated):     Assets:  Communication  Skills  ADL's:  Intact  Cognition:  WNL  Sleep:   fair     Treatment Plan Summary: 34 year old male with extensive history of Alcohol use disorder who was admitted with alcohol withdrawal symptoms. Today, he is alert, oriented, denies psychosis, delusions and self harming thoughts. But he will benefit from referral to Alcohol rehab facility after he is medically stable.  Recommendations: -Continue CIWA and Lorazepam alcohol withdrawal protocol -Consider low dose Prozac 10 mg daily for depression when the liver enzyme trends down. -Consider  social worker consult to facilitate referring patient to Alcohol rehab facility after he is medically stable.  Disposition: No evidence of imminent risk to self or others at present.   Patient does not meet criteria for psychiatric inpatient admission. Supportive therapy provided about ongoing stressors. Psychiatric service is signing out. Re-consult as needed   This service was provided via telemedicine using a 2-way, interactive audio and video technology.  Names of all persons participating in this telemedicine service and their role in this encounter. Name: Donivan Thammavong Role: Patient   Name: Roseanna Rainbow Role: RN  Name: Thedore Mins, MD Role: Psychiatrist    Thedore Mins, MD 06/15/2020 1:18 PM

## 2020-06-15 NOTE — Progress Notes (Signed)
PROGRESS NOTE    Patient: Gary [name]                            PCP: Patient, No Pcp Per                    DOB: 1986-02-02            DOA: 06/13/2020 PXT:[id number]             DOS: 06/15/2020, 10:34 AM   LOS: 2 days   Date of Service: The patient was seen and examined on 06/15/2020  Subjective:   The patient was seen and examined this morning, awake alert oriented no acute distress. Reporting a mild abdominal pain still n.p.o. on IV fluid lactated Ringer Episode of hypoglycemia this morning mild with no change in mental status Hemodynamically stable  This morning he denies of having any suicidal thoughts..  Psychiatry consulted no notes in the chart yet...   Brief Narrative:   Gary [name] is a 34 y.o. male with history of alcohol use disorder, HIV, homelessness, prior GI bleeds, who presents with multiple issues. Including alcohol abuse, overuse in anticipation of harming himself.  ED evaluation: CMP with potassium 2.7 but otherwise unremarkable, LFTs notable for AST 157 and ALT 100 with normal bilirubin, call level 293, lipase 2004. Compared to prior ED visit from July 31 labs largely unchanged with exception of increase in LFTs. Abdominal ultrasound was also obtained which showed fatty liver findings, and chest x-ray which showed no active chest disease.   Assessment & Plan:   Active Problems:   HIV disease (HCC)   Alcohol dependence (HCC)   Major depressive disorder, recurrent severe without psychotic features (HCC)   Transaminitis   Alcohol withdrawal (HCC)   Alcoholic hepatitis without ascites   Pancreatitis   Suicidal ideation    Suicidal ideation Patient endorsing active suicidal ideation with plan to drink himself to death. Behavioral health has already been consulted by ED providers, they will see patient was once he is no longer intoxicated. -Suicide precautions -One-to-one sitter -Psych consulted on 06/14/2020..., no notes in the chart yet,  H was last  evaluated by psych on 06/07/2020--   Alcohol withdrawal Alcohol dependence / Alcoholic hepatitis / Pancreatitis -Remains hemodynamically stable, no signs of agitation confusion  -Alcohol on admission 293 elevated -We will continue to monitor closely, monitoring LFTs, Hepatitis panel checked last in November 2020 and was negative. -N.p.o., advance diet as tolerated -Lactated Ringer's at 150 cc an hour -IV Dilaudid for pain as needed -CIWA protocol with Ativan given elevated LFTs, consider transition to longer acting benzodiazepine pending clinical course -Folic acid, thiamine, multivitamin -Pantoprazole 40 mg IV twice daily -Trend LFTs -Offer treatment for alcohol use disorder prior to discharge, had preliminary discussion regarding naloxone with the patient  Pancreatitis -Monitoring lipase 2004 >>  -We will continue with LR 150 mL/h  -N.p.o. >>> will advance to ice chips, liquids clear liquid diet if tolerated in next 12-24 hours  Hypokalemia Potassium 2.7 on arrival, has been ordered for 20 mEq IV potassium and 40 mEq p.o. Trend BMP and replete as needed.  HIV -Continue Genvoya - Last saw infectious diseases in April of this year, at that time had undetectable viral load.   Episode of hypoglycemia,  -CBG 73, 67,>>> we will check CBG q. 4-6 hours -No history of diabetes not on any insulin -We will monitor closely, -As needed IV  dextrose   Code Status: Full Code, unconfirmed DVT Prophylaxis: Lovenox Family Communication: none Disposition Plan: Inpatient    Consultants: Psych        Procedures:   No admission procedures for hospital encounter.     Antimicrobials:  Anti-infectives (From admission, onward)   Start     Dose/Rate Route Frequency Ordered Stop   06/14/20 0800  elvitegravir-cobicistat-emtricitabine-tenofovir (GENVOYA) 150-150-200-10 MG tablet 1 tablet     Discontinue     1 tablet Oral Daily with breakfast 06/13/20 2242          Medication:  . elvitegravir-cobicistat-emtricitabine-tenofovir  1 tablet Oral Q breakfast  . enoxaparin (LOVENOX) injection  40 mg Subcutaneous Q24H  . folic acid  1 mg Oral Daily  . folic acid  1 mg Intravenous Daily  . multivitamin with minerals  1 tablet Oral Daily  . nicotine  21 mg Transdermal Daily  . pantoprazole  40 mg Oral BID  . thiamine  100 mg Oral Daily    acetaminophen **OR** acetaminophen, HYDROmorphone (DILAUDID) injection, LORazepam **OR** LORazepam, ondansetron **OR** ondansetron (ZOFRAN) IV, polyethylene glycol   Objective:   Vitals:   06/14/20 1806 06/14/20 2207 06/15/20 0651 06/15/20 0908  BP: 115/76 120/83 108/74 121/73  Pulse: 66 62 67 74  Resp:  19 16 18   Temp:  98.2 F (36.8 C) 97.8 F (36.6 C) 98.5 F (36.9 C)  TempSrc:  Oral Oral Oral  SpO2: 97% 97% 95% 96%  Weight:      Height:        Intake/Output Summary (Last 24 hours) at 06/15/2020 1034 Last data filed at 06/15/2020 0400 Gross per 24 hour  Intake 1800 ml  Output 2800 ml  Net -1000 ml   Filed Weights   06/14/20 1333  Weight: 68 kg     Examination:   Physical Exam  Constitution:  Alert, cooperative, no distress,  Appears calm and comfortable  Psychiatric: Normal and stable mood and affect, cognition intact,   HEENT: Normocephalic, PERRL, otherwise with in Normal limits  Chest:Chest symmetric Cardio vascular:  S1/S2, RRR, No murmure, No Rubs or Gallops  pulmonary: Clear to auscultation bilaterally, respirations unlabored, negative wheezes / crackles Abdomen: Soft, non-tender, non-distended, bowel sounds,no masses, no organomegaly Muscular skeletal: Limited exam - in bed, able to move all 4 extremities, Normal strength,  Neuro: CNII-XII intact. , normal motor and sensation, reflexes intact  Extremities: No pitting edema lower extremities, +2 pulses  Skin: Dry, warm to touch, negative for any Rashes, No open wounds Wounds: per nursing documentation     ------------------------------------------------------------------------------------------------------------------------------------------    LABs:  CBC Latest Ref Rng & Units 06/14/2020 06/13/2020 06/09/2020  WBC 4.0 - 10.5 K/uL 2.8(L) 3.8(L) 3.8(L)  Hemoglobin 13.0 - 17.0 g/dL 11.5(L) 12.5(L) 14.5  Hematocrit 39 - 52 % 33.7(L) 35.8(L) 41.9  Platelets 150 - 400 K/uL 144(L) 159 90(L)   CMP Latest Ref Rng & Units 06/15/2020 06/14/2020 06/13/2020  Glucose 70 - 99 mg/dL 08/13/2020) 73 64(Q)  BUN 6 - 20 mg/dL 034(V) <4(Q) <5(Z)  Creatinine 0.61 - 1.24 mg/dL <5(G) 3.87(F) 6.43(P  Sodium 135 - 145 mmol/L 134(L) 140 137  Potassium 3.5 - 5.1 mmol/L 3.8 3.6 2.7(LL)  Chloride 98 - 111 mmol/L 98 103 101  CO2 22 - 32 mmol/L 21(L) 27 24  Calcium 8.9 - 10.3 mg/dL 9.0 2.95) 1.8(A)  Total Protein 6.5 - 8.1 g/dL 6.9 4.1(Y) 7.2  Total Bilirubin 0.3 - 1.2 mg/dL 6.0(Y) 0.5 0.7  Alkaline Phos 38 - 126 U/L  52 42 49  AST 15 - 41 U/L 111(H) 154(H) 157(H)  ALT 0 - 44 U/L 96(H) 95(H) 100(H)       Micro Results Recent Results (from the past 240 hour(s))  SARS Coronavirus 2 by RT PCR (hospital order, performed in [facility] hospital lab) Nasopharyngeal Nasopharyngeal Swab     Status: None   Collection Time: 06/07/20  9:03 PM   Specimen: Nasopharyngeal Swab  Result Value Ref Range Status   SARS Coronavirus 2 NEGATIVE NEGATIVE Final    Comment: (NOTE) SARS-CoV-2 target nucleic acids are NOT DETECTED.  The SARS-CoV-2 RNA is generally detectable in upper and lower respiratory specimens during the acute phase of infection. The lowest concentration of SARS-CoV-2 viral copies this assay can detect is 250 copies / mL. A negative result does not preclude SARS-CoV-2 infection and should not be used as the sole basis for treatment or other patient management decisions.  A negative result may occur with improper specimen collection / handling, submission of specimen other than nasopharyngeal swab, presence of viral  mutation(s) within the areas targeted by this assay, and inadequate number of viral copies (<250 copies / mL). A negative result must be combined with clinical observations, patient history, and epidemiological information.  Fact Sheet for Patients:   BoilerBrush.com.cyhttps://www.fda.gov/media/136312/download  Fact Sheet for Healthcare Providers: https://pope.com/https://www.fda.gov/media/136313/download  This test is not yet approved or  cleared by the [place] FDA and has been authorized for detection and/or diagnosis of SARS-CoV-2 by FDA under an Emergency Use Authorization (EUA).  This EUA will remain in effect (meaning this test can be used) for the duration of the COVID-19 declaration under Section 564(b)(1) of the Act, 21 U.S.C. section 360bbb-3(b)(1), unless the authorization is terminated or revoked sooner.  Performed at [facility], [address] [place], [place] [address]   SARS Coronavirus 2 by RT PCR (hospital order, performed in [facility] hospital lab) Nasopharyngeal Nasopharyngeal Swab     Status: None   Collection Time: 06/14/20 12:25 AM   Specimen: Nasopharyngeal Swab  Result Value Ref Range Status   SARS Coronavirus 2 NEGATIVE NEGATIVE Final    Comment: (NOTE) SARS-CoV-2 target nucleic acids are NOT DETECTED.  The SARS-CoV-2 RNA is generally detectable in upper and lower respiratory specimens during the acute phase of infection. The lowest concentration of SARS-CoV-2 viral copies this assay can detect is 250 copies / mL. A negative result does not preclude SARS-CoV-2 infection and should not be used as the sole basis for treatment or other patient management decisions.  A negative result may occur with improper specimen collection / handling, submission of specimen other than nasopharyngeal swab, presence of viral mutation(s) within the areas targeted by this assay, and inadequate number of viral copies (<250 copies / mL). A negative result must be combined with  clinical observations, patient history, and epidemiological information.  Fact Sheet for Patients:   BoilerBrush.com.cyhttps://www.fda.gov/media/136312/download  Fact Sheet for Healthcare Providers: https://pope.com/https://www.fda.gov/media/136313/download  This test is not yet approved or  cleared by the [place] FDA and has been authorized for detection and/or diagnosis of SARS-CoV-2 by FDA under an Emergency Use Authorization (EUA).  This EUA will remain in effect (meaning this test can be used) for the duration of the COVID-19 declaration under Section 564(b)(1) of the Act, 21 U.S.C. section 360bbb-3(b)(1), unless the authorization is terminated or revoked sooner.  Performed at [facility], [address] [place], [place] [address]     Radiology Reports US Abdomen Limited  Result Date: 06/13/2020 CLINICAL DATA:  Upper abdominal pain. EXAM: ULTRASOUND ABDOMEN LIMITED RIGHT UPPER QUADRANT COMPARISON:  None. FINDINGS: Gallbladder: No gallstones or wall thickening visualized (2.2 mm). No sonographic Murphy sign noted by sonographer. Common bile duct: Diameter: 5.3 mm Liver: The liver is enlarged and measures 19.1 cm in length. No focal lesion identified. Diffusely increased echogenicity of the liver parenchyma is noted. Portal vein is patent on color Doppler imaging with normal direction of blood flow towards the liver. Other: None. IMPRESSION: Enlarged, fatty liver. Electronically Signed   By: Aram Candela M.D.   On: 06/13/2020 23:56   DG Chest Port 1 View  Result Date: 06/13/2020 CLINICAL DATA:  Tremors EXAM: PORTABLE CHEST 1 VIEW COMPARISON:  05/04/2020 FINDINGS: The heart size and mediastinal contours are within normal limits. Both lungs are clear. The visualized skeletal structures are unremarkable. IMPRESSION: No active disease. Electronically Signed   By: Jasmine Pang M.D.   On: 06/13/2020 22:19   DG Knee Complete 4 Views Right  Result Date: 05/18/2020 CLINICAL DATA:  Pain  following fall EXAM: RIGHT KNEE - COMPLETE 4+ VIEW COMPARISON:  None. FINDINGS: Frontal, lateral, and bilateral oblique views were obtained. There is no appreciable fracture or dislocation. There is a moderate joint effusion. There is mild narrowing medially in the patellofemoral joint region. There is no erosive change. There is mild spurring along the anterior superior patella. IMPRESSION: Moderate joint effusion. No fracture or dislocation. Mild narrowing of the medial compartment and patellofemoral joints. Mild spurring along the anterior superior patella likely represents distal quadriceps tendinosis. Electronically Signed   By: Bretta Bang III M.D.   On: 05/18/2020 15:27   DG Hand Complete Left  Result Date: 06/13/2020 CLINICAL DATA:  Left hand pain and swelling. Redness. EXAM: LEFT HAND - COMPLETE 3+ VIEW COMPARISON:  Radiograph 07/19/2017 FINDINGS: There is no evidence of fracture or dislocation. The digits are held in flexion on all views. There is no evidence of arthropathy or other focal bone abnormality. Mild soft tissue edema overlies the dorsum of the metacarpals. No soft tissue air or radiopaque foreign body. IMPRESSION: Soft tissue edema over the dorsum of the metacarpals. No acute osseous abnormality. Electronically Signed   By: Narda Rutherford M.D.   On: 06/13/2020 22:04    SIGNED: Kendell Bane, MD, FACP, FHM. [facility],  Pager (please use [email or web address] to page/text)  If 7PM-7AM, please contact night-coverage [email or web address] 06/15/2020, 10:34 AM

## 2020-06-16 DIAGNOSIS — F1014 Alcohol abuse with alcohol-induced mood disorder: Secondary | ICD-10-CM

## 2020-06-16 LAB — COMPREHENSIVE METABOLIC PANEL
ALT: 78 U/L — ABNORMAL HIGH (ref 0–44)
AST: 70 U/L — ABNORMAL HIGH (ref 15–41)
Albumin: 3.4 g/dL — ABNORMAL LOW (ref 3.5–5.0)
Alkaline Phosphatase: 44 U/L (ref 38–126)
Anion gap: 9 (ref 5–15)
BUN: <5 mg/dL — ABNORMAL LOW (ref 6–20)
CO2: 29 mmol/L (ref 22–32)
Calcium: 9.1 mg/dL (ref 8.9–10.3)
Chloride: 96 mmol/L — ABNORMAL LOW (ref 98–111)
Creatinine, Ser: 0.69 mg/dL (ref 0.61–1.24)
GFR calc Af Amer: >60 mL/min (ref >60)
GFR calc non Af Amer: >60 mL/min (ref >60)
Glucose, Bld: 109 mg/dL — ABNORMAL HIGH (ref 70–99)
Potassium: 3.4 mmol/L — ABNORMAL LOW (ref 3.5–5.1)
Sodium: 134 mmol/L — ABNORMAL LOW (ref 135–145)
Total Bilirubin: 1 mg/dL (ref 0.3–1.2)
Total Protein: 6.1 g/dL — ABNORMAL LOW (ref 6.5–8.1)

## 2020-06-16 LAB — GLUCOSE, CAPILLARY
Glucose-Capillary: 110 mg/dL — ABNORMAL HIGH (ref 70–99)
Glucose-Capillary: 117 mg/dL — ABNORMAL HIGH (ref 70–99)
Glucose-Capillary: 94 mg/dL (ref 70–99)
Glucose-Capillary: 99 mg/dL (ref 70–99)

## 2020-06-16 LAB — LIPASE, BLOOD: Lipase: 958 U/L — ABNORMAL HIGH (ref 11–51)

## 2020-06-16 MED ORDER — KETOROLAC TROMETHAMINE 30 MG/ML IJ SOLN
30.0000 mg | Freq: Once | INTRAMUSCULAR | Status: AC
  Administered 2020-06-16: 30 mg via INTRAVENOUS
  Filled 2020-06-16: qty 1

## 2020-06-16 NOTE — Progress Notes (Signed)
PROGRESS NOTE    Patient: Gary [name]                            PCP: Patient, No Pcp Per                    DOB: 12/17/1985            DOA: 06/13/2020 [id number]             DOS: 06/16/2020, 8:58 AM   LOS: 3 days   Date of Service: The patient was seen and examined on 06/16/2020  Subjective:   The patient was seen and examined this morning, stable no acute distress, still n.p.o. with IV fluid running, no signs of distress. Denies any having any shortness of breath. Denies of having any homicidal suicidal call.   Status post psych evaluation yesterday, patient has been cleared.  Brief Narrative:   Gary [name] is a 34 y.o. male with history of alcohol use disorder, HIV, homelessness, prior GI bleeds, who presents with multiple issues. Including alcohol abuse, overuse in anticipation of harming himself.  ED evaluation: CMP with potassium 2.7 but otherwise unremarkable, LFTs notable for AST 157 and ALT 100 with normal bilirubin, call level 293, lipase 2004. Compared to prior ED visit from July 31 labs largely unchanged with exception of increase in LFTs. Abdominal ultrasound was also obtained which showed fatty liver findings, and chest x-ray which showed no active chest disease.   Assessment & Plan:   Principal Problem:   Alcohol abuse with alcohol-induced mood disorder (HCC) Active Problems:   HIV disease (HCC)   Alcohol dependence (HCC)   Major depressive disorder, recurrent severe without psychotic features (HCC)   Transaminitis   Alcohol withdrawal (HCC)   Alcoholic hepatitis without ascites   Pancreatitis   Suicidal ideation    Suicidal ideation Patient endorsing active suicidal ideation with plan to drink himself to death. Behavioral health has already been consulted by ED providers, they will see patient was once he is no longer intoxicated. -Suicide precautions--was DC'd as patient was cleared by psych yesterday -One-to-one sitter -Psych consulted on  06/14/2020... Follow patient was seen yesterday 06/15/2020.  Patient was cleared from suicidal precaution, recommended SSRI once patient stable   Alcohol withdrawal Alcohol dependence / Alcoholic hepatitis / Pancreatitis -Remained stable, no signs of DTs -  -Alcohol on admission 293 elevated -Monitoring closely, improving LFTs Hepatitis panel checked last in November 2020 and was negative. -N.p.o., >>> advancing to clear liquid -Lactated Ringer's were switched to lactated Ringer with the 125 mL an hour on 06/15/2020 -IV Dilaudid for pain as needed -CIWA protocol with Ativan given elevated LFTs, consider transition to longer acting benzodiazepine pending clinical course -Folic acid, thiamine, multivitamin -Pantoprazole 40 mg IV twice daily  -Offer treatment for alcohol use disorder prior to discharge, had preliminary discussion regarding naloxone with the patient  Pancreatitis -Monitoring lipase 2004 >> 1035 -IVF LR 150 mL/h >> switch to LR with D5 -N.p.o. >>> advancing diet to clear  Hypokalemia Potassium 2.7 >>>3.4 Status post repleted IV, now p.o.  HIV -Continue Genvoya - Last saw infectious diseases in April of this year, at that time had undetectable viral load.   Episode of hypoglycemia,  -CBG 73, 67,>>> 99, 110 we will check CBG q. 4-6 hours CBG has improved with LR D5, -No history of diabetes not on any insulin -We will monitor closely, -As needed IV dextrose  Code Status: Full Code, unconfirmed DVT Prophylaxis: Lovenox Family Communication: none Disposition Plan: Inpatient    Consultants: Psych        Procedures:   No admission procedures for hospital encounter.     Antimicrobials:  Anti-infectives (From admission, onward)   Start     Dose/Rate Route Frequency Ordered Stop   06/14/20 0800  elvitegravir-cobicistat-emtricitabine-tenofovir (GENVOYA) 150-150-200-10 MG tablet 1 tablet     Discontinue     1 tablet Oral Daily with breakfast 06/13/20  2242         Medication:  . elvitegravir-cobicistat-emtricitabine-tenofovir  1 tablet Oral Q breakfast  . enoxaparin (LOVENOX) injection  40 mg Subcutaneous Q24H  . folic acid  1 mg Oral Daily  . folic acid  1 mg Intravenous Daily  . multivitamin with minerals  1 tablet Oral Daily  . nicotine  21 mg Transdermal Daily  . pantoprazole  40 mg Oral BID  . thiamine  100 mg Oral Daily    acetaminophen **OR** acetaminophen, dextrose, HYDROmorphone (DILAUDID) injection, LORazepam **OR** LORazepam, ondansetron **OR** ondansetron (ZOFRAN) IV, polyethylene glycol   Objective:   Vitals:   06/15/20 1404 06/15/20 1405 06/15/20 2244 06/16/20 0704  BP: 115/89 115/89 (!) 122/92 113/84  Pulse: 77 77 91 77  Resp:  11 17 17   Temp: 97.9 F (36.6 C) 97.9 F (36.6 C) 98 F (36.7 C) 97.8 F (36.6 C)  TempSrc: Oral Oral Oral Oral  SpO2: 98% 98% 99% 100%  Weight:      Height:        Intake/Output Summary (Last 24 hours) at 06/16/2020 0858 Last data filed at 06/16/2020 08/16/2020 Gross per 24 hour  Intake 100 ml  Output 4325 ml  Net -4225 ml   Filed Weights   06/14/20 1333  Weight: 68 kg     Examination:      Physical Exam:   General:  Alert, oriented, cooperative, no distress; denies any suicidal homicidal thought--generalized cachexia  HEENT:  Normocephalic, PERRL, otherwise with in Normal limits   Neuro:  CNII-XII intact. , normal motor and sensation, reflexes intact   Lungs:   Clear to auscultation BL, Respirations unlabored, no wheezes / crackles  Cardio:    S1/S2, RRR, No murmure, No Rubs or Gallops   Abdomen:   Soft, non-tender, bowel sounds active all four quadrants,  no guarding or peritoneal signs.  Muscular skeletal:  Limited exam - in bed, able to move all 4 extremities, Normal strength,  2+ pulses,  symmetric, No pitting edema  Skin:  Dry, warm to touch, negative for any Rashes, No open wounds  Wounds: Please see nursing documentation        n     ------------------------------------------------------------------------------------------------------------------------------------------    LABs:  CBC Latest Ref Rng & Units 06/14/2020 06/13/2020 06/09/2020  WBC 4.0 - 10.5 K/uL 2.8(L) 3.8(L) 3.8(L)  Hemoglobin 13.0 - 17.0 g/dL 11.5(L) 12.5(L) 14.5  Hematocrit 39 - 52 % 33.7(L) 35.8(L) 41.9  Platelets 150 - 400 K/uL 144(L) 159 90(L)   CMP Latest Ref Rng & Units 06/16/2020 06/15/2020 06/14/2020  Glucose 70 - 99 mg/dL 08/14/2020) 127(N) 73  BUN 6 - 20 mg/dL 17(G) <0(F) <7(C)  Creatinine 0.61 - 1.24 mg/dL <9(S 4.96) 7.59(F)  Sodium 135 - 145 mmol/L 134(L) 134(L) 140  Potassium 3.5 - 5.1 mmol/L 3.4(L) 3.8 3.6  Chloride 98 - 111 mmol/L 96(L) 98 103  CO2 22 - 32 mmol/L 29 21(L) 27  Calcium 8.9 - 10.3 mg/dL 9.1 9.0 6.38(G)  Total Protein  6.5 - 8.1 g/dL 6.1(L) 6.9 5.9(L)  Total Bilirubin 0.3 - 1.2 mg/dL 1.0 9.1(P) 0.5  Alkaline Phos 38 - 126 U/L 44 52 42  AST 15 - 41 U/L 70(H) 111(H) 154(H)  ALT 0 - 44 U/L 78(H) 96(H) 95(H)       Micro Results Recent Results (from the past 240 hour(s))  SARS Coronavirus 2 by RT PCR (hospital order, performed in [facility] hospital lab) Nasopharyngeal Nasopharyngeal Swab     Status: None   Collection Time: 06/07/20  9:03 PM   Specimen: Nasopharyngeal Swab  Result Value Ref Range Status   SARS Coronavirus 2 NEGATIVE NEGATIVE Final    Comment: (NOTE) SARS-CoV-2 target nucleic acids are NOT DETECTED.  The SARS-CoV-2 RNA is generally detectable in upper and lower respiratory specimens during the acute phase of infection. The lowest concentration of SARS-CoV-2 viral copies this assay can detect is 250 copies / mL. A negative result does not preclude SARS-CoV-2 infection and should not be used as the sole basis for treatment or other patient management decisions.  A negative result may occur with improper specimen collection / handling, submission of specimen other than nasopharyngeal swab, presence of viral  mutation(s) within the areas targeted by this assay, and inadequate number of viral copies (<250 copies / mL). A negative result must be combined with clinical observations, patient history, and epidemiological information.  Fact Sheet for Patients:   BoilerBrush.com.cy  Fact Sheet for Healthcare Providers: https://pope.com/  This test is not yet approved or  cleared by the [place] FDA and has been authorized for detection and/or diagnosis of SARS-CoV-2 by FDA under an Emergency Use Authorization (EUA).  This EUA will remain in effect (meaning this test can be used) for the duration of the COVID-19 declaration under Section 564(b)(1) of the Act, 21 U.S.C. section 360bbb-3(b)(1), unless the authorization is terminated or revoked sooner.  Performed at [facility], [address] [place], [place] [address]   SARS Coronavirus 2 by RT PCR (hospital order, performed in [facility] hospital lab) Nasopharyngeal Nasopharyngeal Swab     Status: None   Collection Time: 06/14/20 12:25 AM   Specimen: Nasopharyngeal Swab  Result Value Ref Range Status   SARS Coronavirus 2 NEGATIVE NEGATIVE Final    Comment: (NOTE) SARS-CoV-2 target nucleic acids are NOT DETECTED.  The SARS-CoV-2 RNA is generally detectable in upper and lower respiratory specimens during the acute phase of infection. The lowest concentration of SARS-CoV-2 viral copies this assay can detect is 250 copies / mL. A negative result does not preclude SARS-CoV-2 infection and should not be used as the sole basis for treatment or other patient management decisions.  A negative result may occur with improper specimen collection / handling, submission of specimen other than nasopharyngeal swab, presence of viral mutation(s) within the areas targeted by this assay, and inadequate number of viral copies (<250 copies / mL). A negative result must be combined with  clinical observations, patient history, and epidemiological information.  Fact Sheet for Patients:   BoilerBrush.com.cy  Fact Sheet for Healthcare Providers: https://pope.com/  This test is not yet approved or  cleared by the [place] FDA and has been authorized for detection and/or diagnosis of SARS-CoV-2 by FDA under an Emergency Use Authorization (EUA).  This EUA will remain in effect (meaning this test can be used) for the duration of the COVID-19 declaration under Section 564(b)(1) of the Act, 21 U.S.C. section 360bbb-3(b)(1), unless the authorization is terminated or revoked sooner.  Performed at [facility], [address], [place], [place] [address]   MRSA PCR Screening     Status: None   Collection Time: 06/15/20  8:46 AM   Specimen: Nasopharyngeal  Result Value Ref Range Status   MRSA by PCR NEGATIVE NEGATIVE Final    Comment:        The GeneXpert MRSA Assay (FDA approved for NASAL specimens only), is one component of a comprehensive MRSA colonization surveillance program. It is not intended to diagnose MRSA infection nor to guide or monitor treatment for MRSA infections. Performed at [facility], [address], [name], [place] [address]     Radiology Reports US Abdomen Limited  Result Date: 06/13/2020 CLINICAL DATA:  Upper abdominal pain. EXAM: ULTRASOUND ABDOMEN LIMITED RIGHT UPPER QUADRANT COMPARISON:  None. FINDINGS: Gallbladder: No gallstones or wall thickening visualized (2.2 mm). No sonographic Murphy sign noted by sonographer. Common bile duct: Diameter: 5.3 mm Liver: The liver is enlarged and measures 19.1 cm in length. No focal lesion identified. Diffusely increased echogenicity of the liver parenchyma is noted. Portal vein is patent on color Doppler imaging with normal direction of blood flow towards the liver. Other: None. IMPRESSION: Enlarged, fatty liver.  Electronically Signed   By: Aram Candela M.D.   On: 06/13/2020 23:56   DG Chest Port 1 View  Result Date: 06/13/2020 CLINICAL DATA:  Tremors EXAM: PORTABLE CHEST 1 VIEW COMPARISON:  05/04/2020 FINDINGS: The heart size and mediastinal contours are within normal limits. Both lungs are clear. The visualized skeletal structures are unremarkable. IMPRESSION: No active disease. Electronically Signed   By: Jasmine Pang M.D.   On: 06/13/2020 22:19   DG Knee Complete 4 Views Right  Result Date: 05/18/2020 CLINICAL DATA:  Pain following fall EXAM: RIGHT KNEE - COMPLETE 4+ VIEW COMPARISON:  None. FINDINGS: Frontal, lateral, and bilateral oblique views were obtained. There is no appreciable fracture or dislocation. There is a moderate joint effusion. There is mild narrowing medially in the patellofemoral joint region. There is no erosive change. There is mild spurring along the anterior superior patella. IMPRESSION: Moderate joint effusion. No fracture or dislocation. Mild narrowing of the medial compartment and patellofemoral joints. Mild spurring along the anterior superior patella likely represents distal quadriceps tendinosis. Electronically Signed   By: Bretta Bang III M.D.   On: 05/18/2020 15:27   DG Hand Complete Left  Result Date: 06/13/2020 CLINICAL DATA:  Left hand pain and swelling. Redness. EXAM: LEFT HAND - COMPLETE 3+ VIEW COMPARISON:  Radiograph 07/19/2017 FINDINGS: There is no evidence of fracture or dislocation. The digits are held in flexion on all views. There is no evidence of arthropathy or other focal bone abnormality. Mild soft tissue edema overlies the dorsum of the metacarpals. No soft tissue air or radiopaque foreign body. IMPRESSION: Soft tissue edema over the dorsum of the metacarpals. No acute osseous abnormality. Electronically Signed   By: Narda Rutherford M.D.   On: 06/13/2020 22:04    SIGNED: Kendell Bane, MD, FACP, FHM. [facility],  Pager (please use  [email or web address] to page/text)  If 7PM-7AM, please contact night-coverage [email or web address] 06/16/2020, 8:58 AM

## 2020-06-17 LAB — GLUCOSE, CAPILLARY
Glucose-Capillary: 120 mg/dL — ABNORMAL HIGH (ref 70–99)
Glucose-Capillary: 160 mg/dL — ABNORMAL HIGH (ref 70–99)
Glucose-Capillary: 99 mg/dL (ref 70–99)

## 2020-06-17 LAB — COMPREHENSIVE METABOLIC PANEL
ALT: 66 U/L — ABNORMAL HIGH (ref 0–44)
AST: 49 U/L — ABNORMAL HIGH (ref 15–41)
Albumin: 3.4 g/dL — ABNORMAL LOW (ref 3.5–5.0)
Alkaline Phosphatase: 46 U/L (ref 38–126)
Anion gap: 4 — ABNORMAL LOW (ref 5–15)
BUN: <5 mg/dL — ABNORMAL LOW (ref 6–20)
CO2: 29 mmol/L (ref 22–32)
Calcium: 9.1 mg/dL (ref 8.9–10.3)
Chloride: 100 mmol/L (ref 98–111)
Creatinine, Ser: 0.63 mg/dL (ref 0.61–1.24)
GFR calc Af Amer: >60 mL/min (ref >60)
GFR calc non Af Amer: >60 mL/min (ref >60)
Glucose, Bld: 111 mg/dL — ABNORMAL HIGH (ref 70–99)
Potassium: 4.3 mmol/L (ref 3.5–5.1)
Sodium: 133 mmol/L — ABNORMAL LOW (ref 135–145)
Total Bilirubin: 0.7 mg/dL (ref 0.3–1.2)
Total Protein: 6.1 g/dL — ABNORMAL LOW (ref 6.5–8.1)

## 2020-06-17 LAB — LIPASE, BLOOD: Lipase: 665 U/L — ABNORMAL HIGH (ref 11–51)

## 2020-06-17 MED ORDER — OXYCODONE HCL 5 MG PO TABS
5.0000 mg | ORAL_TABLET | Freq: Four times a day (QID) | ORAL | Status: DC | PRN
  Administered 2020-06-17 – 2020-06-18 (×4): 5 mg via ORAL
  Filled 2020-06-17 (×5): qty 1

## 2020-06-17 MED ORDER — SODIUM CHLORIDE 0.9 % IV SOLN: INTRAVENOUS | Status: DC

## 2020-06-17 MED ORDER — LORAZEPAM 2 MG/ML IJ SOLN
1.0000 mg | Freq: Once | INTRAMUSCULAR | Status: AC
  Administered 2020-06-17: 1 mg via INTRAVENOUS
  Filled 2020-06-17: qty 1

## 2020-06-17 NOTE — Evaluation (Signed)
Physical Therapy Evaluation Patient Details Name: Gary [name] MRN: [id number] DOB: [birth year] Today's Date: 06/17/2020   History of Present Illness  34 y.o. male with history of alcohol use disorder, HIV, homelessness, prior GI bleeds, who presents with multiple issues.Including alcohol abuse, overuse in anticipation of harming himself.  Clinical Impression  Pt admitted with above diagnosis.  Pt likely will not need PT f/u. Pt with unsteady gait, reports he has not been up/OOB since admission  Pt currently with functional limitations due to the deficits listed below (see PT Problem List). Pt will benefit from skilled PT to increase their independence and safety with mobility to allow discharge to the venue listed below.       Follow Up Recommendations No PT follow up    Equipment Recommendations  Rolling walker with 5" wheels    Recommendations for Other Services       Precautions / Restrictions Precautions Precautions: Fall Restrictions Weight Bearing Restrictions: No      Mobility  Bed Mobility Overal bed mobility: Needs Assistance Bed Mobility: Supine to Sit;Sit to Supine     Supine to sit: Supervision Sit to supine: Supervision   General bed mobility comments: for safety  Transfers Overall transfer level: Needs assistance Equipment used: None Transfers: Sit to/from Stand Sit to Stand: Min guard         General transfer comment: unsteady on initial standing  Ambulation/Gait Ambulation/Gait assistance: Min assist Gait Distance (Feet): 120 Feet Assistive device: Rolling walker (2 wheeled);None Gait Pattern/deviations: Step-through pattern;Decreased stride length     General Gait Details: cues for RW position and safety, intermittent posterior LOB, stability improved with distance. amb 10' without device, utilized RW after this distance  Information systems manager [place] (Stroke Patients Only)       Balance Overall  balance assessment: Needs assistance   Sitting balance-Leahy Scale: Good Sitting balance - Comments: dons socks     Standing balance-Leahy Scale: Fair Standing balance comment: close sueprvision, reliant on UEs for dynamic tasks                             Pertinent Vitals/Pain Pain Assessment: Faces Faces Pain Scale: Hurts a little bit Pain Location: bil LEs Pain Descriptors / Indicators: Sore Pain Intervention(s): Monitored during session;Limited activity within patient's tolerance    Home Living Family/patient expects to be discharged to:: Shelter/Homeless                      Prior Function Level of Independence: Independent               Hand Dominance        Extremity/Trunk Assessment   Upper Extremity Assessment Upper Extremity Assessment: Generalized weakness    Lower Extremity Assessment Lower Extremity Assessment: Generalized weakness       Communication   Communication: No difficulties  Cognition Arousal/Alertness: Awake/alert Behavior During Therapy: WFL for tasks assessed/performed Overall Cognitive Status: Within Functional Limits for tasks assessed                                 General Comments: pt is alert and appropriate, following commands consistently, conversant      General Comments      Exercises     Assessment/Plan    PT Assessment  Patient needs continued PT services  PT Problem List Decreased strength;Decreased activity tolerance;Decreased mobility;Decreased knowledge of use of DME;Decreased balance;Decreased coordination       PT Treatment Interventions DME instruction;Therapeutic exercise;Gait training;Balance training;Functional mobility training;Therapeutic activities;Patient/family education    PT Goals (Current goals can be found in the Care Plan section)  Acute Rehab PT Goals Patient Stated Goal: to speak with SW about medicaid PT Goal Formulation: With patient Time For Goal  Achievement: 07/01/20 Potential to Achieve Goals: Good    Frequency Min 3X/week   Barriers to discharge        Co-evaluation               AM-PAC PT "6 Clicks" Mobility  Outcome Measure Help needed turning from your back to your side while in a flat bed without using bedrails?: A Little Help needed moving from lying on your back to sitting on the side of a flat bed without using bedrails?: A Little Help needed moving to and from a bed to a chair (including a wheelchair)?: A Little Help needed standing up from a chair using your arms (e.g., wheelchair or bedside chair)?: A Little Help needed to walk in hospital room?: A Little Help needed climbing 3-5 steps with a railing? : A Little 6 Click Score: 18    End of Session Equipment Utilized During Treatment: Gait belt Activity Tolerance: Patient tolerated treatment well Patient left: in bed;with call bell/phone within reach;with bed alarm set   PT Visit Diagnosis: Difficulty in walking, not elsewhere classified (R26.2)    Time: 7035-0093 PT Time Calculation (min) (ACUTE ONLY): 24 min   Charges:   PT Evaluation $PT Eval Low Complexity: 1 Low PT Treatments $Gait Training: 8-22 mins        Delice Bison, PT  Acute Rehab Dept ([facility]) [phone] Pager [phone]  06/17/2020   [facility] 06/17/2020, 10:51 AM

## 2020-06-17 NOTE — Progress Notes (Signed)
PROGRESS NOTE    Patient: Gary [name]                            PCP: Patient, No Pcp Per                    DOB: 04/10/1986            DOA: 06/13/2020 [id number]             DOS: 06/17/2020, 10:05 AM   LOS: 4 days   Date of Service: The patient was seen and examined on 06/17/2020  Subjective:   The patient was seen and examined this morning, stable much more interactive.  Denies of having any abdominal pain nausea vomiting.  Tolerated clear liquid diet yesterday Willing to try full liquid diet today. Denies of having any suicidal homicidal ideation   Status post psych evaluation, patient has been cleared.  Brief Narrative:   Gary Amesony P Harrower is a 34 y.o. male with history of alcohol use disorder, HIV, homelessness, prior GI bleeds, who presents with multiple issues. Including alcohol abuse, overuse in anticipation of harming himself.  ED evaluation: CMP with potassium 2.7 but otherwise unremarkable, LFTs notable for AST 157 and ALT 100 with normal bilirubin, call level 293, lipase 2004. Compared to prior ED visit from July 31 labs largely unchanged with exception of increase in LFTs. Abdominal ultrasound was also obtained which showed fatty liver findings, and chest x-ray which showed no active chest disease.   Assessment & Plan:   Principal Problem:   Alcohol abuse with alcohol-induced mood disorder (HCC) Active Problems:   HIV disease (HCC)   Alcohol dependence (HCC)   Major depressive disorder, recurrent severe without psychotic features (HCC)   Transaminitis   Alcohol withdrawal (HCC)   Alcoholic hepatitis without ascites   Pancreatitis   Suicidal ideation    Suicidal ideation -Patient is more interactive today denying of having any suicidal homicidal ideation today.  -On admission patient endorsed active suicidal ideation with plan to drink himself to death.  Behavioral health/psych was consulted  -Suicide precautions--was DC'd as patient was cleared by  psych yesterday -One-to-one sitter--DC'd -Psych consulted on 06/14/2020... Follow patient was seen yesterday 06/15/2020.  Patient was cleared from suicidal precaution, recommended SSRI once patient stable   Alcohol withdrawal Alcohol dependence / Alcoholic hepatitis / Pancreatitis -Stable,  -Alcohol on admission 293 elevated -Monitoring closely, improving LFTs Hepatitis panel checked last in November 2020 and was negative. -N.p.o., >>> advancing to clear liquid >>> advancing to full liquid diet today -Lactated Ringer's were switched to lactated Ringer with the 125 mL an hour on 06/15/2020 -IV Dilaudid for pain as needed -CIWA protocol with Ativan given elevated LFTs, consider transition to longer acting benzodiazepine pending clinical course -Folic acid, thiamine, multivitamin -Pantoprazole 40 mg IV twice daily >> will be switched to p.o.  -Offer treatment for alcohol use disorder prior to discharge, had preliminary discussion regarding naloxone with the patient  Pancreatitis -Monitoring lipase 2004 >> 1035 >> 665 -IVF LR 150 mL/h >> switch to LR with D5 >> will switch normal saline  -N.p.o. >>> advancing diet to clear >> full liquid diet  Hypokalemia Potassium 2.7 >>>3.4 Status post repleted IV, now p.o.  HIV -Continue Genvoya - Last saw infectious diseases in April of this year, at that time had undetectable viral load.   Episode of hypoglycemia,  -CBG 73, 67,>>> 99, 110 >>99 we will check CBG  q. 4-6 hours CBG has improved with LR D5, -No history of diabetes not on any insulin -We will monitor closely, -As needed IV dextrose   Code Status: Full Code, unconfirmed DVT Prophylaxis: Lovenox Family Communication: none Disposition Plan: Inpatient    Consultants: Psych        Procedures:   No admission procedures for hospital encounter.     Antimicrobials:  Anti-infectives (From admission, onward)   Start     Dose/Rate Route Frequency Ordered Stop    06/14/20 0800  elvitegravir-cobicistat-emtricitabine-tenofovir (GENVOYA) 150-150-200-10 MG tablet 1 tablet     Discontinue     1 tablet Oral Daily with breakfast 06/13/20 2242         Medication:  . elvitegravir-cobicistat-emtricitabine-tenofovir  1 tablet Oral Q breakfast  . enoxaparin (LOVENOX) injection  40 mg Subcutaneous Q24H  . folic acid  1 mg Oral Daily  . folic acid  1 mg Intravenous Daily  . multivitamin with minerals  1 tablet Oral Daily  . nicotine  21 mg Transdermal Daily  . pantoprazole  40 mg Oral BID  . thiamine  100 mg Oral Daily    acetaminophen **OR** acetaminophen, dextrose, HYDROmorphone (DILAUDID) injection, ondansetron **OR** ondansetron (ZOFRAN) IV, polyethylene glycol   Objective:   Vitals:   06/16/20 0704 06/16/20 1345 06/16/20 2100 06/17/20 0557  BP: 113/84 108/77 105/72 105/80  Pulse: 77 73 75 77  Resp: 17 12 17 17   Temp: 97.8 F (36.6 C) 98.2 F (36.8 C) 98 F (36.7 C) 97.8 F (36.6 C)  TempSrc: Oral Oral Oral Oral  SpO2: 100% 99% 100% 100%  Weight:      Height:        Intake/Output Summary (Last 24 hours) at 06/17/2020 1005 Last data filed at 06/17/2020 0850 Gross per 24 hour  Intake 4187.76 ml  Output 2025 ml  Net 2162.76 ml   Filed Weights   06/14/20 1333  Weight: 68 kg     Examination:       Physical Exam:   General:  Alert, oriented, cooperative, no distress;   HEENT:  Normocephalic, PERRL, otherwise with in Normal limits   Neuro:  CNII-XII intact. , normal motor and sensation, reflexes intact   Lungs:   Clear to auscultation BL, Respirations unlabored, no wheezes / crackles  Cardio:    S1/S2, RRR, No murmure, No Rubs or Gallops   Abdomen:   Soft, non-tender, bowel sounds active all four quadrants,  no guarding or peritoneal signs.  Muscular skeletal:  Limited exam - in bed, able to move all 4 extremities, Normal strength,  2+ pulses,  symmetric, No pitting edema  Skin:  Dry, warm to touch, negative for any Rashes, No  open wounds  Wounds: Please see nursing documentation         ------------------------------------------------------------------------------------------------------------------------------------------    LABs:  CBC Latest Ref Rng & Units 06/14/2020 06/13/2020 06/09/2020  WBC 4.0 - 10.5 K/uL 2.8(L) 3.8(L) 3.8(L)  Hemoglobin 13.0 - 17.0 g/dL 11.5(L) 12.5(L) 14.5  Hematocrit 39 - 52 % 33.7(L) 35.8(L) 41.9  Platelets 150 - 400 K/uL 144(L) 159 90(L)   CMP Latest Ref Rng & Units 06/17/2020 06/16/2020 06/15/2020  Glucose 70 - 99 mg/dL 08/15/2020) 235(T) 732(K)  BUN 6 - 20 mg/dL 02(R) <4(Y) <7(C)  Creatinine 0.61 - 1.24 mg/dL <6(C 3.76 2.83)  Sodium 135 - 145 mmol/L 133(L) 134(L) 134(L)  Potassium 3.5 - 5.1 mmol/L 4.3 3.4(L) 3.8  Chloride 98 - 111 mmol/L 100 96(L) 98  CO2 22 - 32  mmol/L 29 29 21(L)  Calcium 8.9 - 10.3 mg/dL 9.1 9.1 9.0  Total Protein 6.5 - 8.1 g/dL 6.1(L) 6.1(L) 6.9  Total Bilirubin 0.3 - 1.2 mg/dL 0.7 1.0 4.7(M)  Alkaline Phos 38 - 126 U/L 46 44 52  AST 15 - 41 U/L 49(H) 70(H) 111(H)  ALT 0 - 44 U/L 66(H) 78(H) 96(H)       Micro Results Recent Results (from the past 240 hour(s))  SARS Coronavirus 2 by RT PCR (hospital order, performed in [facility] hospital lab) Nasopharyngeal Nasopharyngeal Swab     Status: None   Collection Time: 06/07/20  9:03 PM   Specimen: Nasopharyngeal Swab  Result Value Ref Range Status   SARS Coronavirus 2 NEGATIVE NEGATIVE Final    Comment: (NOTE) SARS-CoV-2 target nucleic acids are NOT DETECTED.  The SARS-CoV-2 RNA is generally detectable in upper and lower respiratory specimens during the acute phase of infection. The lowest concentration of SARS-CoV-2 viral copies this assay can detect is 250 copies / mL. A negative result does not preclude SARS-CoV-2 infection and should not be used as the sole basis for treatment or other patient management decisions.  A negative result may occur with improper specimen collection / handling, submission of  specimen other than nasopharyngeal swab, presence of viral mutation(s) within the areas targeted by this assay, and inadequate number of viral copies (<250 copies / mL). A negative result must be combined with clinical observations, patient history, and epidemiological information.  Fact Sheet for Patients:   BoilerBrush.com.cy  Fact Sheet for Healthcare Providers: https://pope.com/  This test is not yet approved or  cleared by the [place] FDA and has been authorized for detection and/or diagnosis of SARS-CoV-2 by FDA under an Emergency Use Authorization (EUA).  This EUA will remain in effect (meaning this test can be used) for the duration of the COVID-19 declaration under Section 564(b)(1) of the Act, 21 U.S.C. section 360bbb-3(b)(1), unless the authorization is terminated or revoked sooner.  Performed at [facility], [address] [place], [place] [address]   SARS Coronavirus 2 by RT PCR (hospital order, performed in [facility] hospital lab) Nasopharyngeal Nasopharyngeal Swab     Status: None   Collection Time: 06/14/20 12:25 AM   Specimen: Nasopharyngeal Swab  Result Value Ref Range Status   SARS Coronavirus 2 NEGATIVE NEGATIVE Final    Comment: (NOTE) SARS-CoV-2 target nucleic acids are NOT DETECTED.  The SARS-CoV-2 RNA is generally detectable in upper and lower respiratory specimens during the acute phase of infection. The lowest concentration of SARS-CoV-2 viral copies this assay can detect is 250 copies / mL. A negative result does not preclude SARS-CoV-2 infection and should not be used as the sole basis for treatment or other patient management decisions.  A negative result may occur with improper specimen collection / handling, submission of specimen other than nasopharyngeal swab, presence of viral mutation(s) within the areas targeted by this assay, and inadequate number of viral copies (<250 copies /  mL). A negative result must be combined with clinical observations, patient history, and epidemiological information.  Fact Sheet for Patients:   BoilerBrush.com.cy  Fact Sheet for Healthcare Providers: https://pope.com/  This test is not yet approved or  cleared by the [place] FDA and has been authorized for detection and/or diagnosis of SARS-CoV-2 by FDA under an Emergency Use Authorization (EUA).  This EUA will remain in effect (meaning this test can be used) for the duration of the COVID-19 declaration under Section  564(b)(1) of the Act, 21 U.S.C. section 360bbb-3(b)(1), unless the authorization is terminated or revoked sooner.  Performed at [facility], [address] [place], [place] [address]   MRSA PCR Screening     Status: None   Collection Time: 06/15/20  8:46 AM   Specimen: Nasopharyngeal  Result Value Ref Range Status   MRSA by PCR NEGATIVE NEGATIVE Final    Comment:        The GeneXpert MRSA Assay (FDA approved for NASAL specimens only), is one component of a comprehensive MRSA colonization surveillance program. It is not intended to diagnose MRSA infection nor to guide or monitor treatment for MRSA infections. Performed at [facility], [address] [place], [place] [address]     Radiology Reports US Abdomen Limited  Result Date: 06/13/2020 CLINICAL DATA:  Upper abdominal pain. EXAM: ULTRASOUND ABDOMEN LIMITED RIGHT UPPER QUADRANT COMPARISON:  None. FINDINGS: Gallbladder: No gallstones or wall thickening visualized (2.2 mm). No sonographic Murphy sign noted by sonographer. Common bile duct: Diameter: 5.3 mm Liver: The liver is enlarged and measures 19.1 cm in length. No focal lesion identified. Diffusely increased echogenicity of the liver parenchyma is noted. Portal vein is patent on color Doppler imaging with normal direction of blood flow towards the liver. Other:  None. IMPRESSION: Enlarged, fatty liver. Electronically Signed   By: Aram Candela M.D.   On: 06/13/2020 23:56   DG Chest Port 1 View  Result Date: 06/13/2020 CLINICAL DATA:  Tremors EXAM: PORTABLE CHEST 1 VIEW COMPARISON:  05/04/2020 FINDINGS: The heart size and mediastinal contours are within normal limits. Both lungs are clear. The visualized skeletal structures are unremarkable. IMPRESSION: No active disease. Electronically Signed   By: Jasmine Pang M.D.   On: 06/13/2020 22:19   DG Knee Complete 4 Views Right  Result Date: 05/18/2020 CLINICAL DATA:  Pain following fall EXAM: RIGHT KNEE - COMPLETE 4+ VIEW COMPARISON:  None. FINDINGS: Frontal, lateral, and bilateral oblique views were obtained. There is no appreciable fracture or dislocation. There is a moderate joint effusion. There is mild narrowing medially in the patellofemoral joint region. There is no erosive change. There is mild spurring along the anterior superior patella. IMPRESSION: Moderate joint effusion. No fracture or dislocation. Mild narrowing of the medial compartment and patellofemoral joints. Mild spurring along the anterior superior patella likely represents distal quadriceps tendinosis. Electronically Signed   By: Bretta Bang III M.D.   On: 05/18/2020 15:27   DG Hand Complete Left  Result Date: 06/13/2020 CLINICAL DATA:  Left hand pain and swelling. Redness. EXAM: LEFT HAND - COMPLETE 3+ VIEW COMPARISON:  Radiograph 07/19/2017 FINDINGS: There is no evidence of fracture or dislocation. The digits are held in flexion on all views. There is no evidence of arthropathy or other focal bone abnormality. Mild soft tissue edema overlies the dorsum of the metacarpals. No soft tissue air or radiopaque foreign body. IMPRESSION: Soft tissue edema over the dorsum of the metacarpals. No acute osseous abnormality. Electronically Signed   By: Narda Rutherford M.D.   On: 06/13/2020 22:04    SIGNED: Kendell Bane, MD, FACP,  FHM. [facility],  Pager (please use [email or web address] to page/text)  If 7PM-7AM, please contact night-coverage [email or web address], Password [facility] 06/17/2020, 10:05 AM

## 2020-06-18 LAB — LIPASE, BLOOD: Lipase: 788 U/L — ABNORMAL HIGH (ref 11–51)

## 2020-06-18 LAB — COMPREHENSIVE METABOLIC PANEL
ALT: 63 U/L — ABNORMAL HIGH (ref 0–44)
AST: 46 U/L — ABNORMAL HIGH (ref 15–41)
Albumin: 3.6 g/dL (ref 3.5–5.0)
Alkaline Phosphatase: 47 U/L (ref 38–126)
Anion gap: 7 (ref 5–15)
BUN: <5 mg/dL — ABNORMAL LOW (ref 6–20)
CO2: 29 mmol/L (ref 22–32)
Calcium: 9.4 mg/dL (ref 8.9–10.3)
Chloride: 100 mmol/L (ref 98–111)
Creatinine, Ser: 0.73 mg/dL (ref 0.61–1.24)
GFR calc Af Amer: >60 mL/min (ref >60)
GFR calc non Af Amer: >60 mL/min (ref >60)
Glucose, Bld: 98 mg/dL (ref 70–99)
Potassium: 4.1 mmol/L (ref 3.5–5.1)
Sodium: 136 mmol/L (ref 135–145)
Total Bilirubin: 0.8 mg/dL (ref 0.3–1.2)
Total Protein: 6.2 g/dL — ABNORMAL LOW (ref 6.5–8.1)

## 2020-06-18 LAB — GLUCOSE, CAPILLARY
Glucose-Capillary: 154 mg/dL — ABNORMAL HIGH (ref 70–99)
Glucose-Capillary: 94 mg/dL (ref 70–99)

## 2020-06-18 MED ORDER — ADULT MULTIVITAMIN W/MINERALS CH: 1.0000 | ORAL_TABLET | Freq: Every day | ORAL | 3 refills | Status: AC

## 2020-06-18 MED ORDER — THIAMINE HCL 100 MG PO TABS: 100.0000 mg | ORAL_TABLET | Freq: Every day | ORAL | 0 refills | Status: AC

## 2020-06-18 MED ORDER — FOLIC ACID 1 MG PO TABS: 1.0000 mg | ORAL_TABLET | Freq: Every day | ORAL | 0 refills | Status: AC

## 2020-06-18 MED ORDER — FLUOXETINE HCL 20 MG PO CAPS
20.0000 mg | ORAL_CAPSULE | Freq: Every day | ORAL | Status: DC
  Filled 2020-06-18: qty 1

## 2020-06-18 MED ORDER — FLUOXETINE HCL 20 MG PO CAPS: 20.0000 mg | ORAL_CAPSULE | Freq: Every day | ORAL | 3 refills | Status: DC

## 2020-06-18 NOTE — Discharge Summary (Signed)
Physician Discharge Summary [facility]    Patient: Gary [name]                   Admit date: 06/13/2020   DOB: 01/05/1986             Discharge date:06/18/2020/11:05 AM [id number]                          PCP: Patient, No Pcp Per  Disposition: Home -   Recommendations for Outpatient Follow-up:   . Follow up: in 1 week-PCP and outpatient psychiatry  Discharge Condition: Stable   Code Status:   Code Status: Full Code  Diet recommendation: Regular healthy diet   Discharge Diagnoses:    Principal Problem:   Alcohol abuse with alcohol-induced mood disorder (HCC) Active Problems:   HIV disease (HCC)   Alcohol dependence (HCC)   Major depressive disorder, recurrent severe without psychotic features (HCC)   Transaminitis   Alcohol withdrawal (HCC)   Alcoholic hepatitis without ascites   Pancreatitis   Suicidal ideation   History of Present Illness/ Hospital Course Gary [name] Summary:   Gary [name] a 34 y.o.malewith history of alcohol use disorder, HIV, homelessness, prior GI bleeds, who presents with multiple issues. Including alcohol abuse, overuse in anticipation of harming himself.  ED evaluation: CMP with potassium 2.7 but otherwise unremarkable, LFTs notable for AST 157 and ALT 100 with normal bilirubin, call level 293, lipase 2004.Compared to prior ED visit from July 31 labs largely unchanged with exception of increase in LFTs. Abdominal ultrasound was also obtained which showed fatty liver findings, and chest x-ray which showed no active chest disease.   Assessment & Plan:    Suicidal ideation -Patient remained stable today, denying of having any homicidal suicidal ideation. -Has agreed to start antidepressant medication recommended by psych Prozac.  -On admission patient endorsed active suicidal ideation with plan to drink himself to death.  Behavioral health/psych was consulted  -Suicide precautions--was DC'd as patient was  cleared by psych yesterday -One-to-one sitter--DC'd -Psych consulted on 06/14/2020... Follow patient was seen yesterday 06/15/2020.  Patient was cleared from suicidal precaution, recommended SSRI once patient stable  -  Alcohol withdrawal Alcohol dependence / Alcoholic hepatitis / Pancreatitis -Stable,  -Alcohol on admission 293 elevated -Monitoring closely, improving LFTs Hepatitis panel checked last in November 2020 and was negative. -N.p.o., >>> advancing to clear liquid >>> advancing to full liquid diet today -Lactated Ringer's were switched to lactated Ringer with the 125 mL an hour on 06/15/2020 -IV Dilaudid for pain as needed -CIWA protocol with Ativan given elevated LFTs, consider transition to longer acting benzodiazepine pending clinical course -Folic acid, thiamine, multivitamin -Pantoprazole 40 mg IV twice daily >> switched to p.o.  -Offer treatment for alcohol use disorder prior to discharge, had preliminary discussion regarding naloxone with the patient  Pancreatitis -Monitoring lipase 2004 >> 1035 >> 665 -IVF LR 150 mL/h >> switch to LR with D5 >> will switch normal saline  >> DC -N.p.o. >>> advancing diet to clear >> full liquid diet>> advanced tolerating  Hypokalemia Potassium 2.7 >>>3.4 Status post repleted IV, now p.o.  HIV -Continue Genvoya - Last saw infectious diseases in April of this year, at that time had undetectable viral load.   Episode of hypoglycemia,  -CBG 73, 67,>>> 99, 110 >>99 we will check CBG q. 4-6 hours CBG has improved with LR D5, -No history of diabetes not on any insulin -We will monitor closely, -  CBG stabilized, tolerating p.o. now   Code Status:Full Code, unconfirmed  Disposition -cleared to be discharged home.  Patient advised of change of environment, abstaining from any alcohol, products, Instructed follow with PCP and psychiatrist as soon as possible    Consultants: Psych       Discharge Instructions:    Discharge Instructions    Activity as tolerated - No restrictions   Complete by: As directed    Call MD for:  difficulty breathing, headache or visual disturbances   Complete by: As directed    Call MD for:  temperature >100.4   Complete by: As directed    Diet general   Complete by: As directed    Discharge instructions   Complete by: As directed    Follow-up PCP as soon as possible for, follow-up with psychiatry for further evaluation recommendation Recommending change of environment, abstaining from alcohol, and all alcohol products You to follow-up with your PCP regarding your liver and pancreatic dysfunction   Increase activity slowly   Complete by: As directed        Medication List    STOP taking these medications   carbamazepine 200 MG tablet Commonly known as: TEGretol     TAKE these medications   FLUoxetine 20 MG capsule Commonly known as: PROZAC Take 1 capsule (20 mg total) by mouth daily. Start taking on: June 19, 2020   folic acid 1 MG tablet Commonly known as: FOLVITE Take 1 tablet (1 mg total) by mouth daily for 5 days.   Genvoya 150-150-200-10 MG Tabs tablet Generic drug: elvitegravir-cobicistat-emtricitabine-tenofovir Take 1 tablet by mouth daily with breakfast.   multivitamin with minerals Tabs tablet Take 1 tablet by mouth daily. Start taking on: June 19, 2020   nicotine 21 mg/24hr patch Commonly known as: NICODERM CQ - dosed in mg/24 hours Place 1 patch (21 mg total) onto the skin daily.   pantoprazole 40 MG tablet Commonly known as: PROTONIX Take 1 tablet (40 mg total) by mouth daily.   sucralfate 1 GM/10ML suspension Commonly known as: CARAFATE Take 10 mLs (1 g total) by mouth 4 (four) times daily -  with meals and at bedtime for 14 days.   thiamine 100 MG tablet Take 1 tablet (100 mg total) by mouth daily for 5 days.       Allergies  Allergen Reactions  . Tegretol [Carbamazepine] Other (See Comments)    Caused vertigo for  2 days after taking it  . Caffeine Palpitations     Procedures /Studies:   US Abdomen Limited  Result Date: 06/13/2020 CLINICAL DATA:  Upper abdominal pain. EXAM: ULTRASOUND ABDOMEN LIMITED RIGHT UPPER QUADRANT COMPARISON:  None. FINDINGS: Gallbladder: No gallstones or wall thickening visualized (2.2 mm). No sonographic Murphy sign noted by sonographer. Common bile duct: Diameter: 5.3 mm Liver: The liver is enlarged and measures 19.1 cm in length. No focal lesion identified. Diffusely increased echogenicity of the liver parenchyma is noted. Portal vein is patent on color Doppler imaging with normal direction of blood flow towards the liver. Other: None. IMPRESSION: Enlarged, fatty liver. Electronically Signed   By: Aram Candela M.D.   On: 06/13/2020 23:56   DG Chest Port 1 View  Result Date: 06/13/2020 CLINICAL DATA:  Tremors EXAM: PORTABLE CHEST 1 VIEW COMPARISON:  05/04/2020 FINDINGS: The heart size and mediastinal contours are within normal limits. Both lungs are clear. The visualized skeletal structures are unremarkable. IMPRESSION: No active disease. Electronically Signed   By: Adrian Prows.D.  On: 06/13/2020 22:19   DG Hand Complete Left  Result Date: 06/13/2020 CLINICAL DATA:  Left hand pain and swelling. Redness. EXAM: LEFT HAND - COMPLETE 3+ VIEW COMPARISON:  Radiograph 07/19/2017 FINDINGS: There is no evidence of fracture or dislocation. The digits are held in flexion on all views. There is no evidence of arthropathy or other focal bone abnormality. Mild soft tissue edema overlies the dorsum of the metacarpals. No soft tissue air or radiopaque foreign body. IMPRESSION: Soft tissue edema over the dorsum of the metacarpals. No acute osseous abnormality. Electronically Signed   By: Narda RutherfordMelanie  Sanford M.D.   On: 06/13/2020 22:04     Subjective:   Patient was seen and examined 06/18/2020, 11:05 AM Patient stable today. No acute distress.  No issues overnight Stable for  discharge.  Discharge Exam:    Vitals:   06/17/20 0557 06/17/20 1501 06/17/20 2123 06/18/20 0609  BP: 105/80 102/72 106/74 108/72  Pulse: 77 81 80 (!) 59  Resp: 17 16 15 17   Temp: 97.8 F (36.6 C) 98 F (36.7 C) 98.3 F (36.8 C) 97.9 F (36.6 C)  TempSrc: Oral Oral Oral Oral  SpO2: 100% 100% 100% 97%  Weight:      Height:        General: Pt lying comfortably in bed & appears in no obvious distress. Cardiovascular: S1 & S2 heard, RRR, S1/S2 +. No murmurs, rubs, gallops or clicks. No JVD or pedal edema. Respiratory: Clear to auscultation without wheezing, rhonchi or crackles. No increased work of breathing. Abdominal:  Non-distended, non-tender & soft. No organomegaly or masses appreciated. Normal bowel sounds heard. CNS: Alert and oriented. No focal deficits. Extremities: no edema, no cyanosis    The results of significant diagnostics from this hospitalization (including imaging, microbiology, ancillary and laboratory) are listed below for reference.      Microbiology:   Recent Results (from the past 240 hour(s))  SARS Coronavirus 2 by RT PCR (hospital order, [facility]) Nasopharyngeal Nasopharyngeal Swab     Status: None   Collection Time: 06/14/20 12:25 AM   Specimen: Nasopharyngeal Swab  Result Value Ref Range Status   SARS Coronavirus 2 NEGATIVE NEGATIVE Final    Comment: (NOTE) SARS-CoV-2 target nucleic acids are NOT DETECTED.  The SARS-CoV-2 RNA is generally detectable in upper and lower respiratory specimens during the acute phase of infection. The lowest concentration of SARS-CoV-2 viral copies this assay can detect is 250 copies / mL. A negative result does not preclude SARS-CoV-2 infection and should not be used as the sole basis for treatment or other patient management decisions.  A negative result may occur with improper specimen collection / handling, submission of specimen other than nasopharyngeal swab, presence of viral  mutation(s) within the areas targeted by this assay, and inadequate number of viral copies (<250 copies / mL). A negative result must be combined with clinical observations, patient history, and epidemiological information.  Fact Sheet for Patients:   BoilerBrush.com.cyhttps://www.fda.gov/media/136312/download  Fact Sheet for Healthcare Providers: https://pope.com/https://www.fda.gov/media/136313/download  This test is not yet approved or  cleared by the [place] FDA and has been authorized for detection and/or diagnosis of SARS-CoV-2 by FDA under an Emergency Use Authorization (EUA).  This EUA will remain in effect (meaning this test can be used) for the duration of the COVID-19 declaration under Section 564(b)(1) of the Act, 21 U.S.C. section 360bbb-3(b)(1), unless the authorization is terminated or revoked sooner.  Performed at [facility], [address]  [place], [place] [address]   MRSA PCR Screening     Status: None   Collection Time: 06/15/20  8:46 AM   Specimen: Nasopharyngeal  Result Value Ref Range Status   MRSA by PCR NEGATIVE NEGATIVE Final    Comment:        The GeneXpert MRSA Assay (FDA approved for NASAL specimens only), is one component of a comprehensive MRSA colonization surveillance program. It is not intended to diagnose MRSA infection nor to guide or monitor treatment for MRSA infections. Performed at [facility], [address], [place], [place] [address]      Labs:   CBC: Recent Labs  Lab 06/13/20 1845 06/14/20 0527  WBC 3.8* 2.8*  HGB 12.5* 11.5*  HCT 35.8* 33.7*  MCV 96.8 98.8  PLT 159 144*   Basic Metabolic Panel: Recent Labs  Lab 06/13/20 1845 06/13/20 2154 06/13/20 2202 06/14/20 0527 06/15/20 0556 06/16/20 0608 06/17/20 0545 06/18/20 0601  NA   < >  --   --  140 134* 134* 133* 136  K   < >  --   --  3.6 3.8 3.4* 4.3 4.1  CL   < >  --   --  103 98 96* 100 100  CO2   < >  --   --  27 21* 29 29 29   GLUCOSE   < >   --   --  73 67* 109* 111* 98  BUN   < >  --   --  <5* <5* <5* <5* <5*  CREATININE   < >  --   --  0.44* 0.46* 0.69 0.63 0.73  CALCIUM   < >  --   --  8.1* 9.0 9.1 9.1 9.4  MG  --  2.3  --   --   --   --   --   --   PHOS  --   --  4.6  --   --   --   --   --    < > = values in this interval not displayed.   Liver Function Tests: Recent Labs  Lab 06/14/20 0527 06/15/20 0556 06/16/20 0608 06/17/20 0545 06/18/20 0601  AST 154* 111* 70* 49* 46*  ALT 95* 96* 78* 66* 63*  ALKPHOS 42 52 44 46 47  BILITOT 0.5 1.5* 1.0 0.7 0.8  PROT 5.9* 6.9 6.1* 6.1* 6.2*  ALBUMIN 3.3* 3.7 3.4* 3.4* 3.6   BNP (last 3 results) No results for input(s): BNP in the last 8760 hours. Cardiac Enzymes: No results for input(s): CKTOTAL, CKMB, CKMBINDEX, TROPONINI in the last 168 hours. CBG: Recent Labs  Lab 06/17/20 0008 06/17/20 0559 06/17/20 1158 06/17/20 1849 06/18/20 0611  GLUCAP 120* 99 160* 154* 94   Hgb A1c No results for input(s): HGBA1C in the last 72 hours. Lipid Profile No results for input(s): CHOL, HDL, LDLCALC, TRIG, CHOLHDL, LDLDIRECT in the last 72 hours. Thyroid function studies No results for input(s): TSH, T4TOTAL, T3FREE, THYROIDAB in the last 72 hours.  Invalid input(s): FREET3 Anemia work up No results for input(s): VITAMINB12, FOLATE, FERRITIN, TIBC, IRON, RETICCTPCT in the last 72 hours. Urinalysis    Component Value Date/Time   COLORURINE STRAW (A) 05/15/2020 1824   APPEARANCEUR CLEAR 05/15/2020 1824   APPEARANCEUR Clear 11/15/2014 1755   LABSPEC 1.003 (L) 05/15/2020 1824   LABSPEC 1.002 11/15/2014 1755   PHURINE 6.0 05/15/2020 1824   GLUCOSEU NEGATIVE 05/15/2020 1824   GLUCOSEU Negative 11/15/2014 1755  HGBUR NEGATIVE 05/15/2020 1824   BILIRUBINUR NEGATIVE 05/15/2020 1824   BILIRUBINUR Negative 11/15/2014 1755   KETONESUR NEGATIVE 05/15/2020 1824   PROTEINUR NEGATIVE 05/15/2020 1824   NITRITE NEGATIVE 05/15/2020 1824   LEUKOCYTESUR NEGATIVE 05/15/2020 1824    LEUKOCYTESUR Negative 11/15/2014 1755         Time coordinating discharge: Over 45 minutes  SIGNED: Kendell Bane, MD, FACP, FHM. [facility],  Please use [email or web address] to Page If 7PM-7AM, please contact night-coverage [email or web address], Password [facility] 06/18/2020, 11:05 AM

## 2020-06-18 NOTE — TOC Transition Note (Signed)
Transition of Care [facility]) - CM/SW Discharge Note   Patient Details  Name: Gary [name] MRN: [id number] Date of Birth: [birth year]  Transition [facility]) CM/SW Contact:  Bartholome Bill, RN Phone Number: 06/18/2020, 11:35 AM   Clinical Narrative:     This CM spoke with pt at bedside at length about substance abuse resources. Pt states that he will "think on it" when discussing inpatient substance abuse programs. Pt states that he plans to start going [place] AA meetings [place]. He states that he goes to the [facility] for showers. This CM gave pt a packet of resources for outpatient and inpatient substance abuse, [place] and [address], [place] the [place]. I also gave pt the phone number for [facility] to enquire about a shelter bed. This CM provided pt with 2 bus passes at dc.  Readmission Risk Interventions Readmission Risk Prevention Plan 06/19/2018  Post Dischage Appt Complete  Medication Screening Complete  Transportation Screening Complete  PCP follow-up Complete  Some recent data might be hidden

## 2020-06-23 ENCOUNTER — Other Ambulatory Visit: Payer: Self-pay

## 2020-06-23 ENCOUNTER — Emergency Department (HOSPITAL_COMMUNITY)
Admission: EM | Admit: 2020-06-23 | Discharge: 2020-06-25 | Disposition: A | Discharge status: Home / Self Care | Payer: Self-pay | Attending: Emergency Medicine | Admitting: Emergency Medicine

## 2020-06-23 ENCOUNTER — Emergency Department (HOSPITAL_COMMUNITY): Payer: Self-pay

## 2020-06-23 ENCOUNTER — Encounter (HOSPITAL_COMMUNITY): Payer: Self-pay

## 2020-06-23 DIAGNOSIS — R45851 Suicidal ideations: Secondary | ICD-10-CM | POA: Insufficient documentation

## 2020-06-23 DIAGNOSIS — R441 Visual hallucinations: Secondary | ICD-10-CM | POA: Insufficient documentation

## 2020-06-23 DIAGNOSIS — B20 Human immunodeficiency virus [HIV] disease: Secondary | ICD-10-CM | POA: Insufficient documentation

## 2020-06-23 DIAGNOSIS — T5192XA Toxic effect of unspecified alcohol, intentional self-harm, initial encounter: Secondary | ICD-10-CM | POA: Insufficient documentation

## 2020-06-23 DIAGNOSIS — Z20822 Contact with and (suspected) exposure to covid-19: Secondary | ICD-10-CM | POA: Insufficient documentation

## 2020-06-23 DIAGNOSIS — F1010 Alcohol abuse, uncomplicated: Secondary | ICD-10-CM | POA: Insufficient documentation

## 2020-06-23 DIAGNOSIS — Z87891 Personal history of nicotine dependence: Secondary | ICD-10-CM | POA: Insufficient documentation

## 2020-06-23 LAB — CBC
HCT: 37 % — ABNORMAL LOW (ref 39.0–52.0)
Hemoglobin: 12.8 g/dL — ABNORMAL LOW (ref 13.0–17.0)
MCH: 34 pg (ref 26.0–34.0)
MCHC: 34.6 g/dL (ref 30.0–36.0)
MCV: 98.4 fL (ref 80.0–100.0)
Platelets: 400 10*3/uL (ref 150–400)
RBC: 3.76 MIL/uL — ABNORMAL LOW (ref 4.22–5.81)
RDW: 14 % (ref 11.5–15.5)
WBC: 5 10*3/uL (ref 4.0–10.5)
nRBC: 0 % (ref 0.0–0.2)

## 2020-06-23 LAB — COMPREHENSIVE METABOLIC PANEL
ALT: 65 U/L — ABNORMAL HIGH (ref 0–44)
AST: 61 U/L — ABNORMAL HIGH (ref 15–41)
Albumin: 4.5 g/dL (ref 3.5–5.0)
Alkaline Phosphatase: 54 U/L (ref 38–126)
Anion gap: 17 — ABNORMAL HIGH (ref 5–15)
BUN: <5 mg/dL — ABNORMAL LOW (ref 6–20)
CO2: 26 mmol/L (ref 22–32)
Calcium: 8.9 mg/dL (ref 8.9–10.3)
Chloride: 101 mmol/L (ref 98–111)
Creatinine, Ser: 0.69 mg/dL (ref 0.61–1.24)
GFR calc Af Amer: >60 mL/min (ref >60)
GFR calc non Af Amer: >60 mL/min (ref >60)
Glucose, Bld: 144 mg/dL — ABNORMAL HIGH (ref 70–99)
Potassium: 3.2 mmol/L — ABNORMAL LOW (ref 3.5–5.1)
Sodium: 144 mmol/L (ref 135–145)
Total Bilirubin: 0.4 mg/dL (ref 0.3–1.2)
Total Protein: 7.4 g/dL (ref 6.5–8.1)

## 2020-06-23 LAB — SALICYLATE LEVEL: Salicylate Lvl: <7 mg/dL — ABNORMAL LOW (ref 7.0–30.0)

## 2020-06-23 LAB — ETHANOL: Alcohol, Ethyl (B): 436 mg/dL (ref <10)

## 2020-06-23 LAB — ACETAMINOPHEN LEVEL: Acetaminophen (Tylenol), Serum: <10 ug/mL — ABNORMAL LOW (ref 10–30)

## 2020-06-23 LAB — MAGNESIUM: Magnesium: 2.1 mg/dL (ref 1.7–2.4)

## 2020-06-23 IMAGING — CT CT HEAD W/O CM
3 of 4 series · 15 of 47 positions shown, 18 images · non-contrast
Comparison: CT brain [DATE]

CLINICAL DATA: Mental status change

EXAM:
CT HEAD WITHOUT CONTRAST
TECHNIQUE: Contiguous axial images were obtained from the base of the skull
through the vertex without intravenous contrast.

[Series 3: head wo · axial · 0.47mm/px · z∈[-139,-4]mm · 9 of 35 slices shown, 12 images]
[im 4/35  brain]
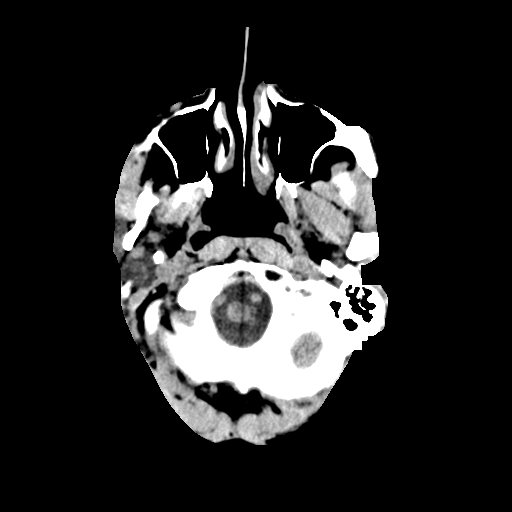
[im 4/35  bone]
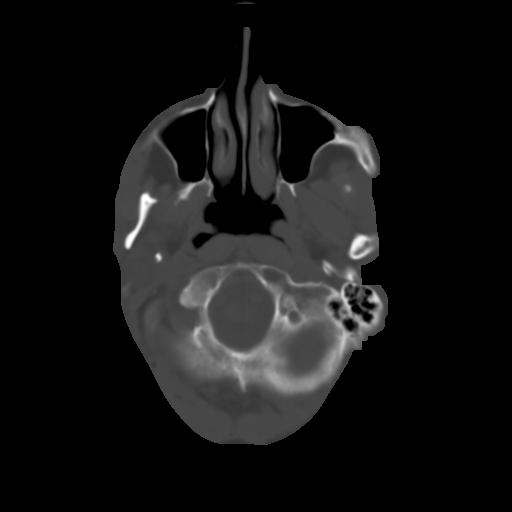
[im 7/35  brain]
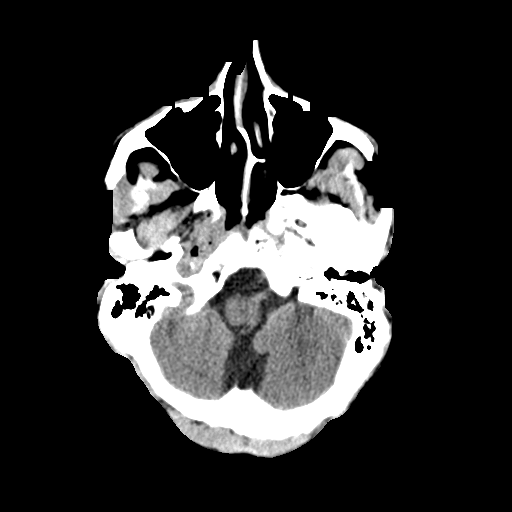
[im 11/35  brain]
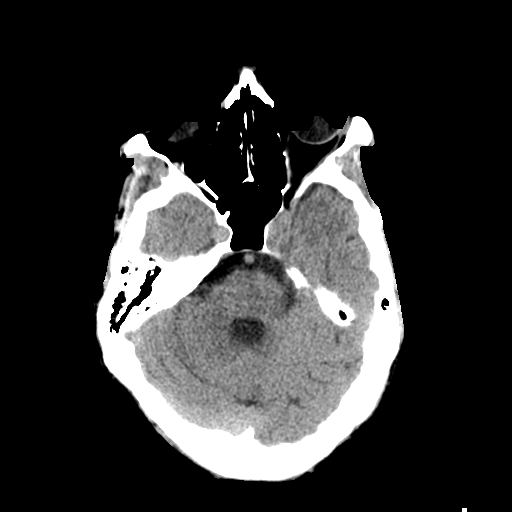
[im 14/35  brain]
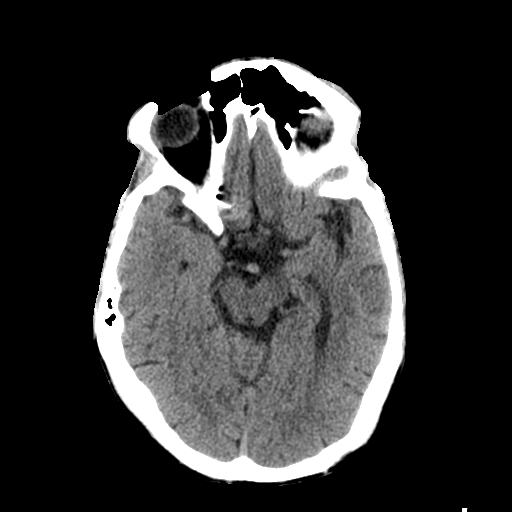
[im 18/35  brain]
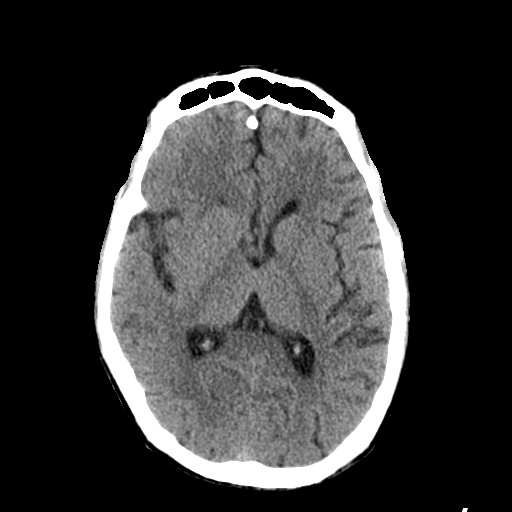
[im 18/35  bone]
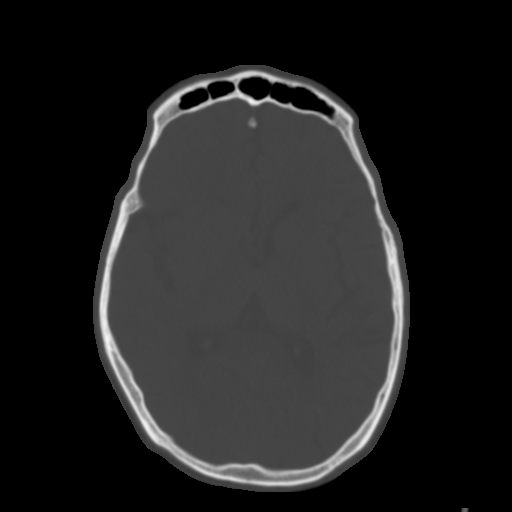
[im 21/35  brain]
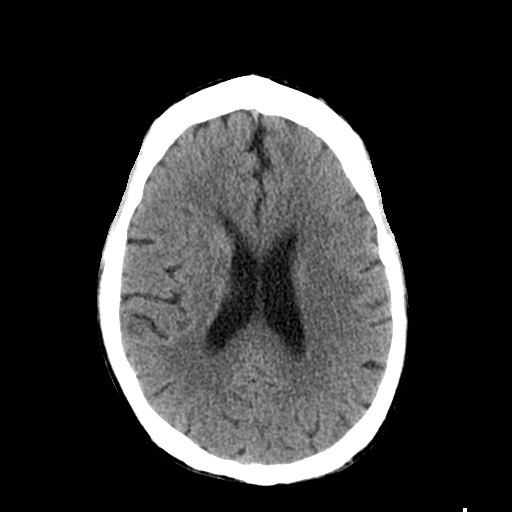
[im 24/35  brain]
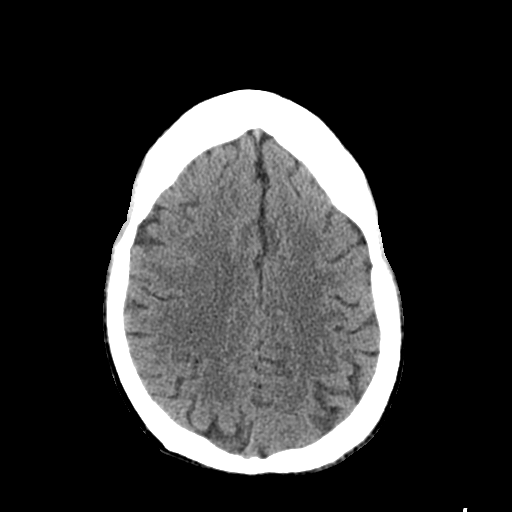
[im 28/35  brain]
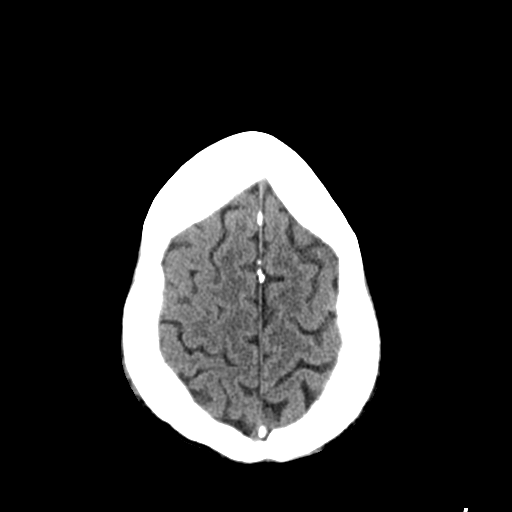
[im 31/35  brain]
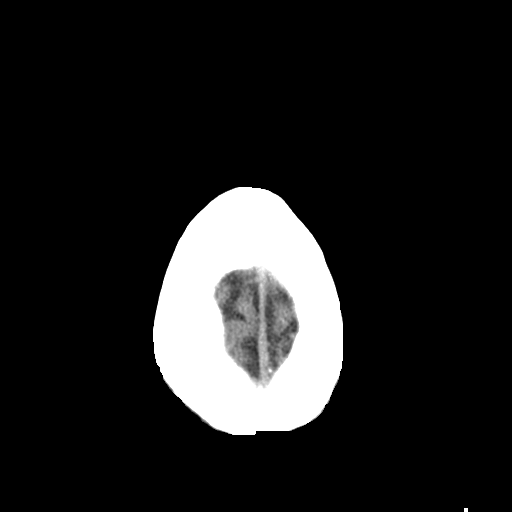
[im 31/35  bone]
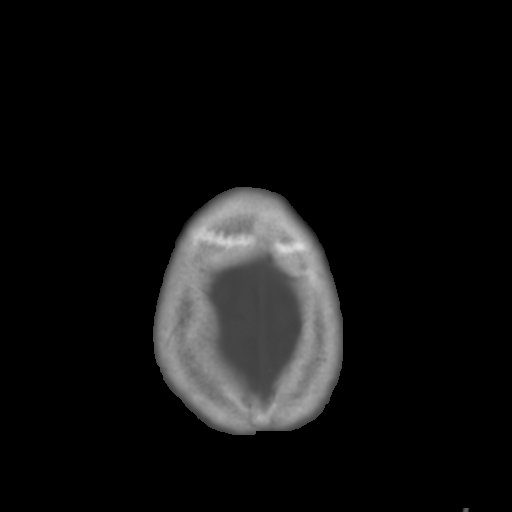

[Series 6: coronal soft tissue · coronal · 0.34mm/px · 3 of 77 slices shown]
[im 26/77  brain]
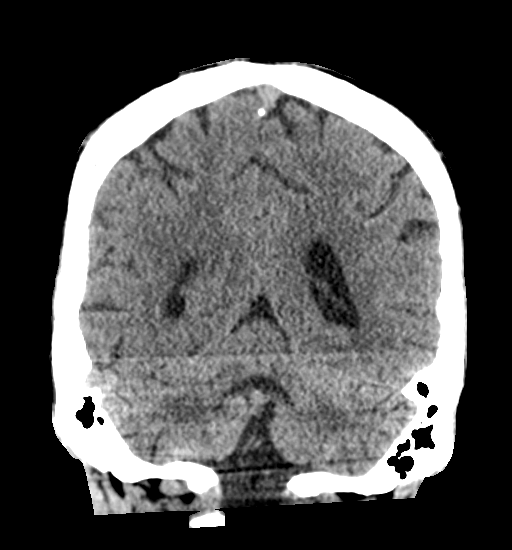
[im 34/77  brain]
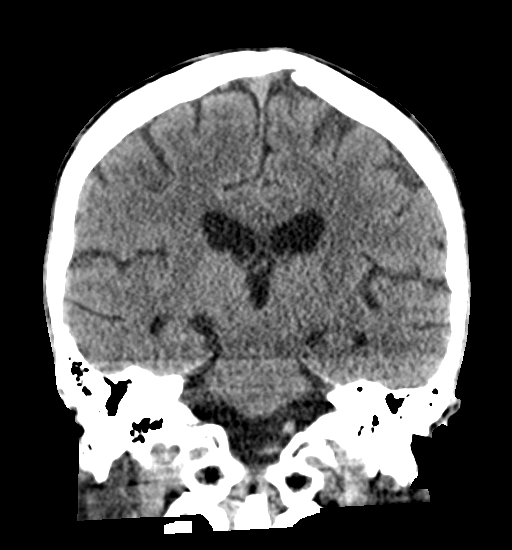
[im 43/77  brain]
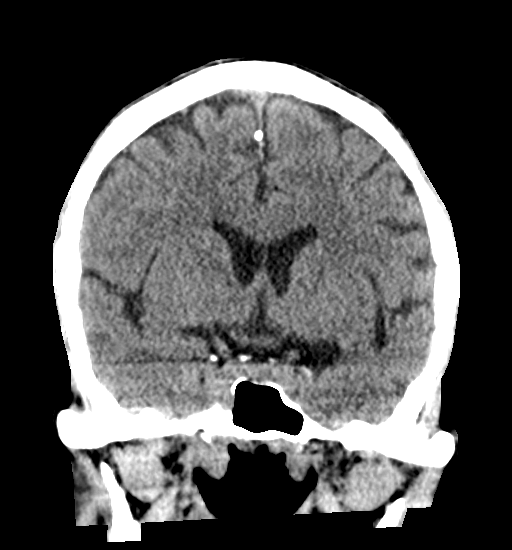

[Series 7: sagittal soft tissue · sagittal · 0.36mm/px · 3 of 55 slices shown]
[im 19/55  brain]
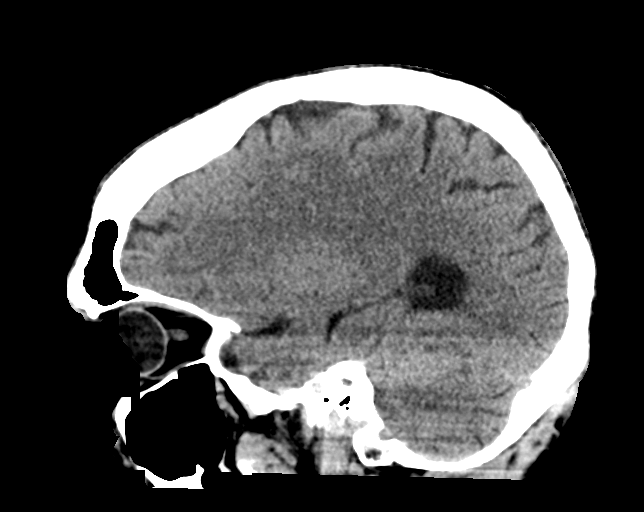
[im 28/55  brain]
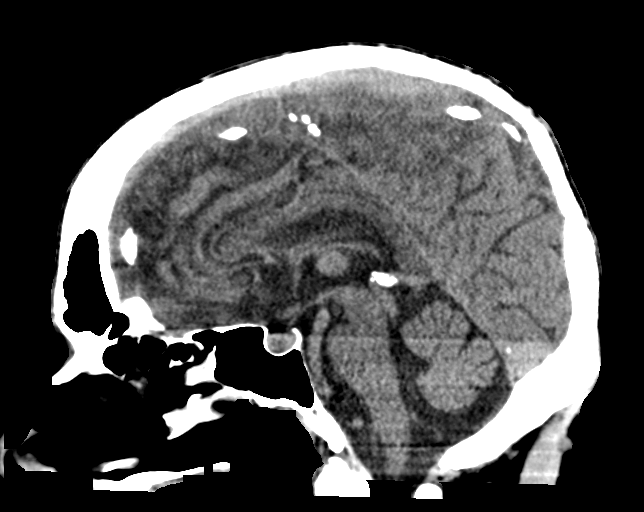
[im 37/55  brain]
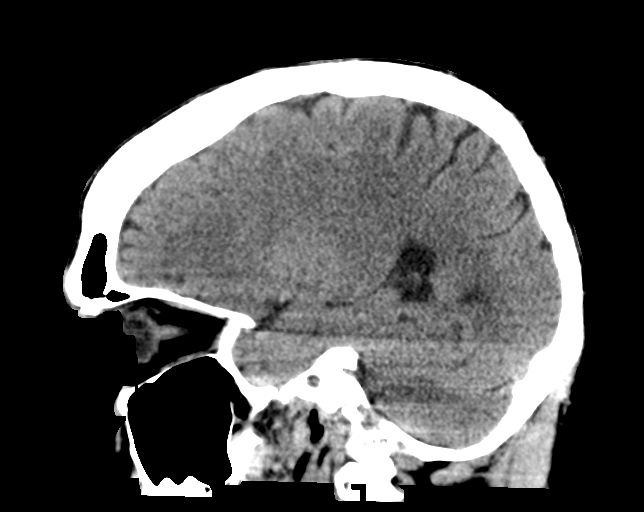

[15 of 47 positions shown; findings below may reference images not displayed]

FINDINGS: Brain: No acute territorial infarction, hemorrhage or intracranial
mass. The ventricles are nonenlarged.

Vascular: No hyperdense vessels.  No unexpected calcification.

Skull: Normal. Negative for fracture or focal lesion.

Sinuses/Orbits: No acute finding.

Other: None
IMPRESSION: Negative non contrasted CT appearance of the brain.

## 2020-06-23 IMAGING — CT CT CERVICAL SPINE W/O CM
3 of 4 series · 11 of 33 positions shown, 13 images · non-contrast
Comparison: CT [DATE]

CLINICAL DATA: Altered mental status

EXAM:
CT CERVICAL SPINE WITHOUT CONTRAST
TECHNIQUE: Multidetector CT imaging of the cervical spine was performed without
intravenous contrast. Multiplanar CT image reconstructions were also
generated.

[Series 6: orthogonal bone · axial · 0.23mm/px · z∈[-287,-160]mm · 3 of 116 slices shown, 4 images]
[im 33/116  soft-tissue]
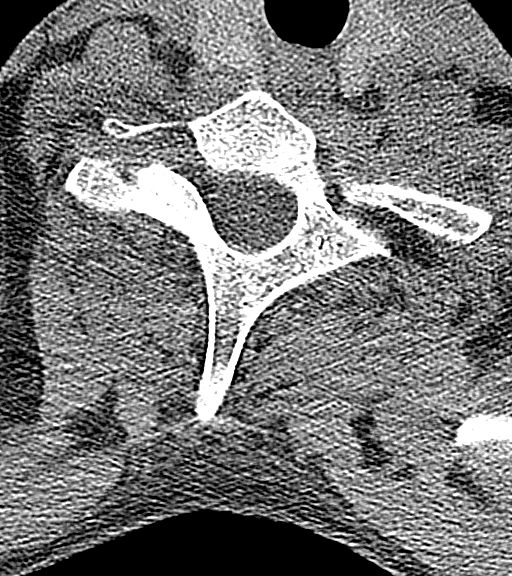
[im 33/116  bone]
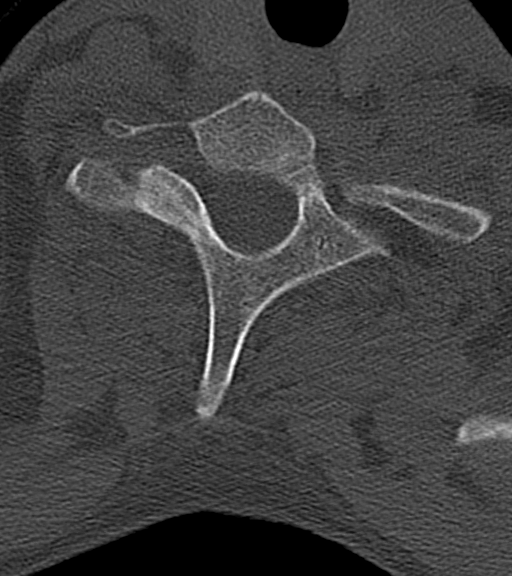
[im 66/116  bone]
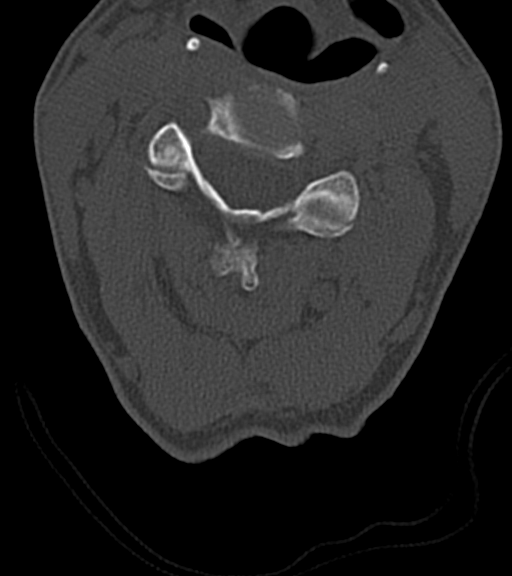
[im 99/116  bone]
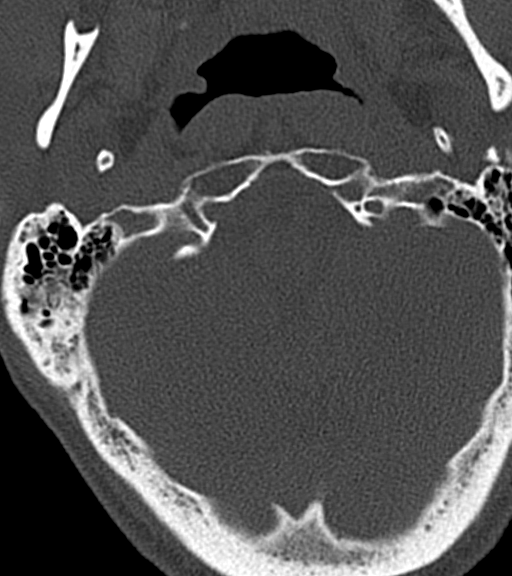

[Series 7: coronal bone · coronal · 0.23mm/px · 3 of 67 slices shown]
[im 14/67  bone]
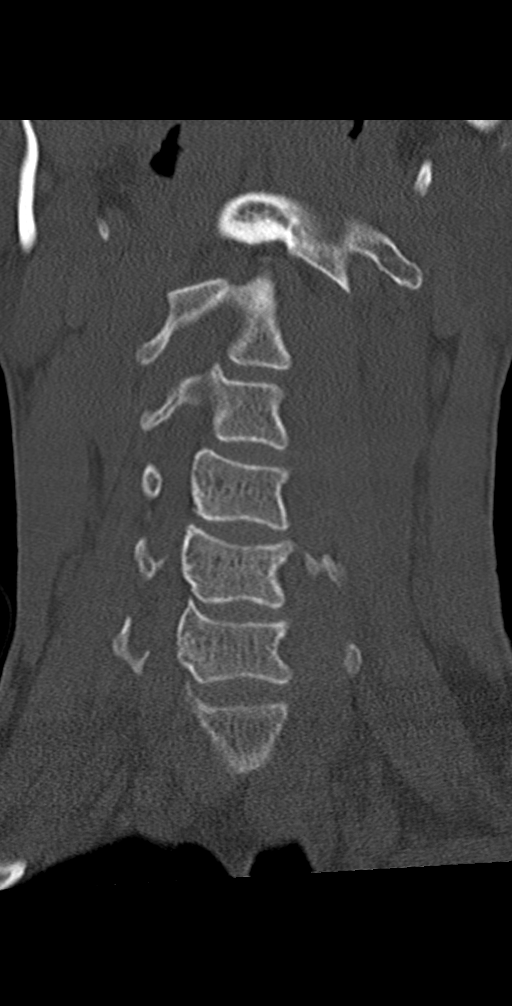
[im 27/67  bone]
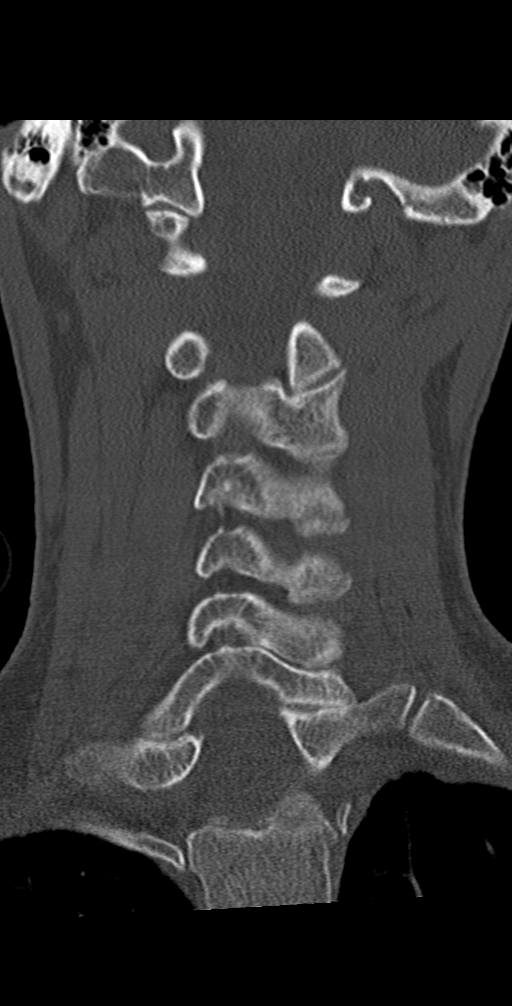
[im 40/67  bone]
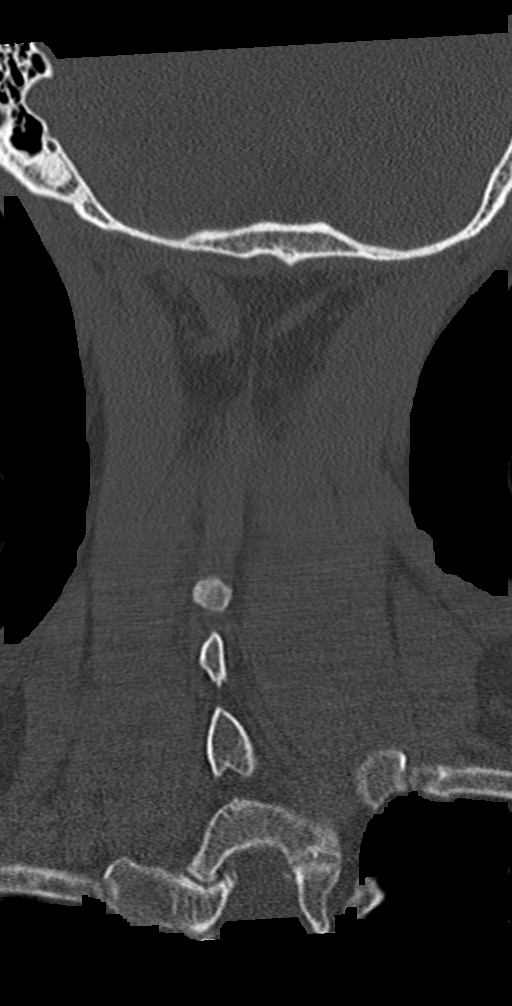

[Series 8: sagittal bone · sagittal · 0.28mm/px · 5 of 61 slices shown, 6 images]
[im 21/61  bone]
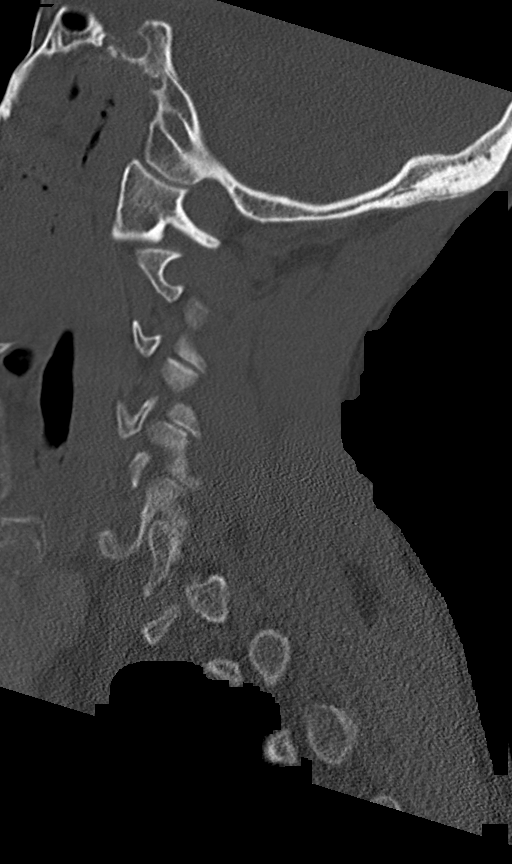
[im 26/61  bone]
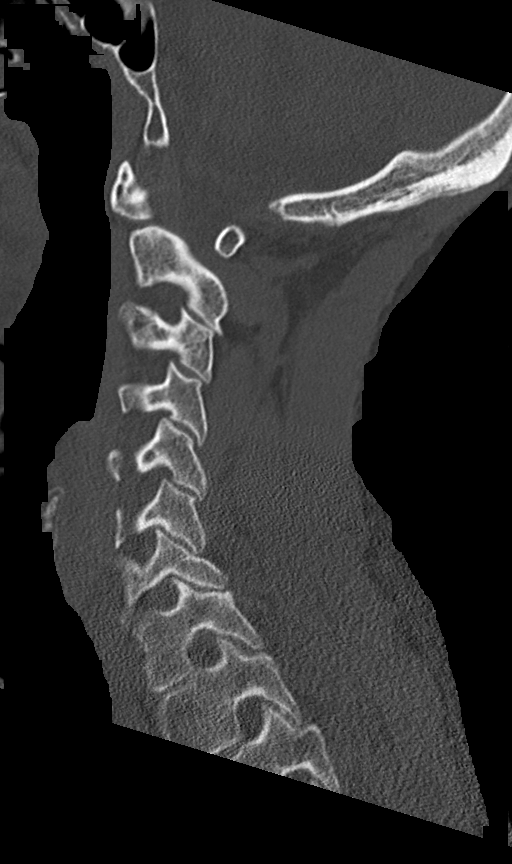
[im 31/61  soft-tissue]
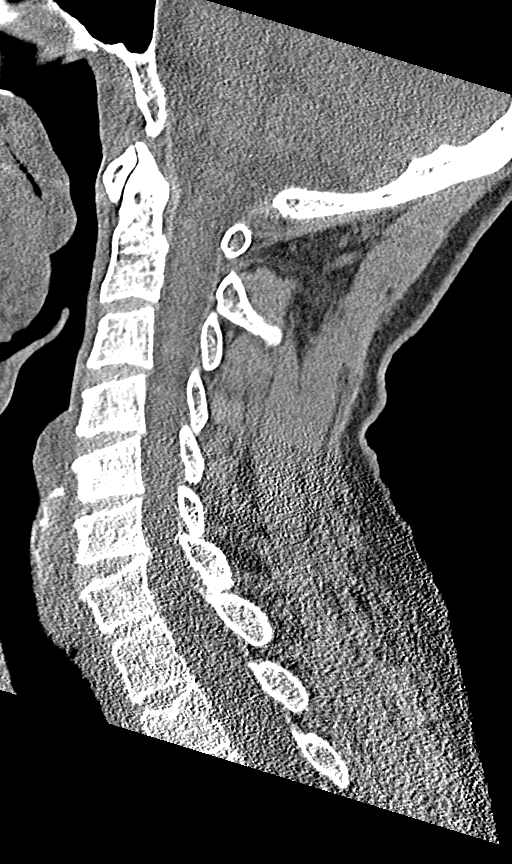
[im 31/61  bone]
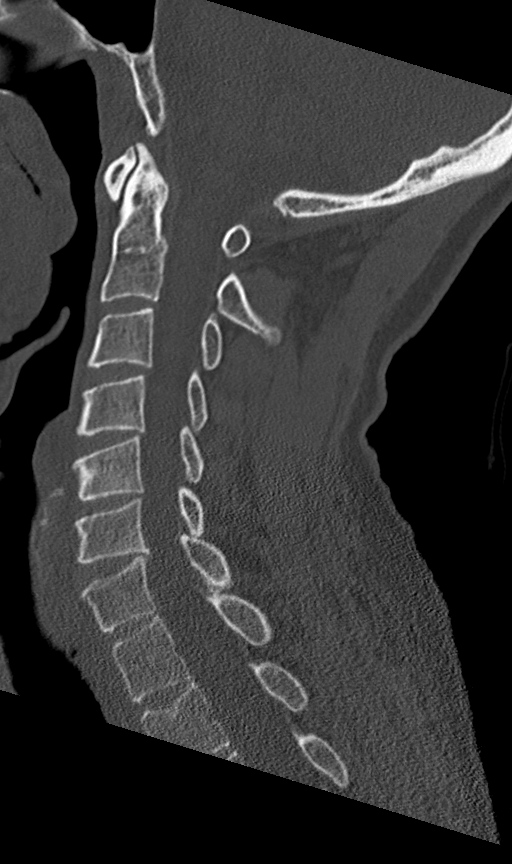
[im 36/61  bone]
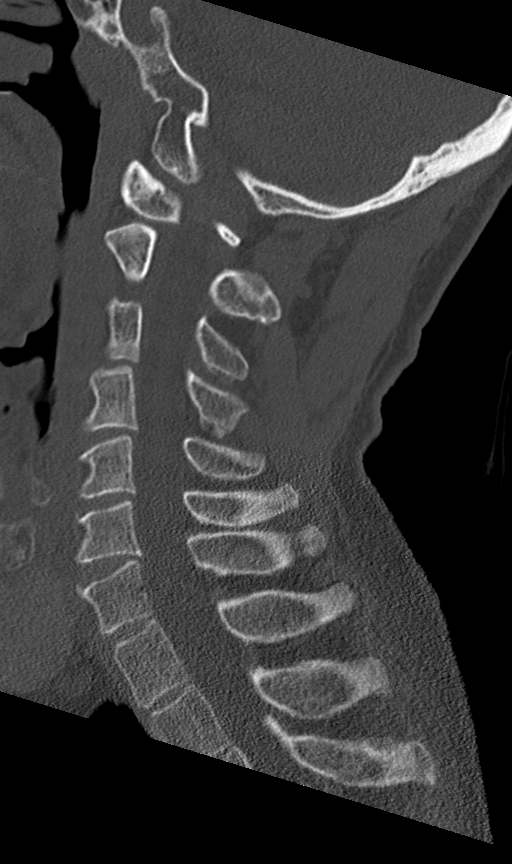
[im 41/61  bone]
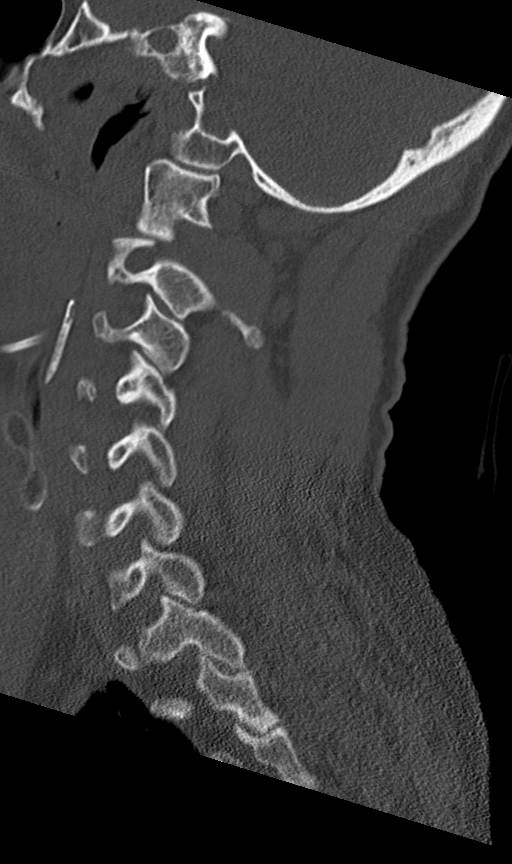

[11 of 33 positions shown; findings below may reference images not displayed]

FINDINGS: Alignment: Mild rotation of C1 on C2, likely due to positioning.
Sagittal alignment is within normal limits. Facets are normally
aligned.

Skull base and vertebrae: No acute fracture. Chronic minimal wedging
of C5.

Soft tissues and spinal canal: No prevertebral fluid or swelling. No
visible canal hematoma.

Disc levels: The disc spaces are relatively maintained. Minimal
degenerative spurring at multiple levels.

Upper chest: Negative.

Other: None
IMPRESSION: No CT evidence for acute osseous abnormality.

## 2020-06-23 MED ORDER — LORAZEPAM 2 MG/ML IJ SOLN: 0.0000 mg | Freq: Two times a day (BID) | INTRAMUSCULAR | Status: DC

## 2020-06-23 MED ORDER — ALUM & MAG HYDROXIDE-SIMETH 200-200-20 MG/5ML PO SUSP
30.0000 mL | Freq: Four times a day (QID) | ORAL | Status: DC | PRN
  Administered 2020-06-25: 30 mL via ORAL
  Filled 2020-06-23: qty 30

## 2020-06-23 MED ORDER — ACETAMINOPHEN 325 MG PO TABS: 650.0000 mg | ORAL_TABLET | ORAL | Status: DC | PRN

## 2020-06-23 MED ORDER — LORAZEPAM 2 MG/ML IJ SOLN
0.0000 mg | Freq: Four times a day (QID) | INTRAMUSCULAR | Status: DC
  Administered 2020-06-24 – 2020-06-25 (×4): 2 mg via INTRAVENOUS
  Filled 2020-06-23 (×4): qty 1

## 2020-06-23 MED ORDER — ONDANSETRON HCL 4 MG PO TABS
4.0000 mg | ORAL_TABLET | Freq: Three times a day (TID) | ORAL | Status: DC | PRN
  Administered 2020-06-24 – 2020-06-25 (×2): 4 mg via ORAL
  Filled 2020-06-23 (×2): qty 1

## 2020-06-23 MED ORDER — LORAZEPAM 1 MG PO TABS: 0.0000 mg | ORAL_TABLET | Freq: Two times a day (BID) | ORAL | Status: DC

## 2020-06-23 MED ORDER — THIAMINE HCL 100 MG PO TABS
100.0000 mg | ORAL_TABLET | Freq: Every day | ORAL | Status: DC
  Administered 2020-06-25: 100 mg via ORAL
  Filled 2020-06-23: qty 1

## 2020-06-23 MED ORDER — LORAZEPAM 1 MG PO TABS: 0.0000 mg | ORAL_TABLET | Freq: Four times a day (QID) | ORAL | Status: DC

## 2020-06-23 MED ORDER — NICOTINE 21 MG/24HR TD PT24
21.0000 mg | MEDICATED_PATCH | Freq: Every day | TRANSDERMAL | Status: DC
  Filled 2020-06-23 (×2): qty 1

## 2020-06-23 MED ORDER — THIAMINE HCL 100 MG/ML IJ SOLN
100.0000 mg | Freq: Every day | INTRAMUSCULAR | Status: DC
  Administered 2020-06-24: 100 mg via INTRAVENOUS
  Filled 2020-06-23: qty 2

## 2020-06-23 MED ORDER — THIAMINE HCL 100 MG/ML IJ SOLN
Freq: Once | INTRAVENOUS | Status: AC
  Filled 2020-06-23: qty 1000

## 2020-06-23 NOTE — ED Notes (Signed)
Patient transported to CT 

## 2020-06-23 NOTE — ED Triage Notes (Signed)
[place] [facility] [place]. Reports drinking heavily in an attempt to kill himself.

## 2020-06-23 NOTE — ED Provider Notes (Signed)
[facility]-EMERGENCY DEPT Provider Note   CSN: [id number] Arrival date & time: 06/23/20  2118     History Chief Complaint  Patient presents [id number]  . Suicidal    Gary [name] is a 34 y.o. male.  34 year old male with history of HIV, alcohol abuse brought in by EMS, states he was trying to drink himself to death, was talking to a friend who called someone and the next thing he knows he was in the ER. Reports visual hallucinations (cartoon characters), denies auditory hallucinations. Reports active SI, plan when he leaves is to get a gun because the alcohol didn't work. Denies HI. Patient will not explain why he is feeing suicidal tonight. Denies drug use, reports drinking "a lot." Reports multiple prior attempts. Reports his whole body is on fire and everything hurts.         Past Medical History:  Diagnosis Date  . Alcohol abuse   . HIV (human immunodeficiency virus infection) (HCC)   . Subdural hematoma [facility](HCC)     Patient Active Problem List   Diagnosis Date Noted  . Suicidal ideation 06/14/2020  . Pancreatitis 06/13/2020  . 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  . Malnutrition of moderate degree 05/07/2020  . Gastrointestinal hemorrhage   . Alcoholic hepatitis without ascites   . Melena 05/05/2020  . Fever 05/05/2020  . Alcoholic ketoacidosis 05/05/2020  . Alcohol withdrawal (HCC) 04/17/2020  . Thrombocytopenia (HCC) 07/25/2019  . Transaminitis 07/25/2019  . Diarrhea 07/25/2019  . Traumatic subdural hematoma (HCC) 07/02/2019  . Alcohol abuse with intoxication (HCC) 07/02/2019  . Alcohol abuse with alcohol-induced mood disorder (HCC) 05/13/2019  . Alcohol dependence (HCC) 04/16/2019  . 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 HIV disease (HCC) 06/16/2018  . Polysubstance abuse (HCC) 06/16/2018  . Cigarette smoker 06/16/2018    Past Surgical History:  Procedure Laterality Date  .  INCISION AND DRAINAGE PERIRECTAL ABSCESS N/A 09/24/2016   Procedure: IRRIGATION AND DEBRIDEMENT PERIRECTAL ABSCESS;  Surgeon: Ricarda Frameharles Woodham, MD;  Location: [facility] ORS;  Service: General;  Laterality: N/A;  . none         Family History  Problem Relation Age of Onset  . Alcohol abuse Mother   . Alcohol abuse Father     Social History   Tobacco Use  . Smoking status: Former Smoker    Packs/day: 0.50    Types: Cigarettes  . Smokeless tobacco: Never Used  . Tobacco comment: unable to smoke while incarcerated 6+ months 02/13/20  Vaping Use  . Vaping Use: Never used  Substance Use Topics  . Alcohol use: Yes    Alcohol/week: 3.0 standard drinks    Types: 3 Cans of beer per week    Comment: 24 oz  . Drug use: Not Currently    Types: Marijuana, Methamphetamines    Comment: last used 04/10/2019    Home Medications Prior to Admission medications   Medication Sig Start Date End Date Taking? Authorizing Provider  elvitegravir-cobicistat-emtricitabine-tenofovir (GENVOYA) 150-150-200-10 MG TABS tablet Take 1 tablet by mouth daily with breakfast. 05/18/20   Linwood DibblesKnapp, Jon, MD  FLUoxetine (PROZAC) 20 MG capsule Take 1 capsule (20 mg total) by mouth daily. 06/19/20 07/19/20  Shahmehdi, Gemma PayorSeyed A, MD  folic acid (FOLVITE) 1 MG tablet Take 1 tablet (1 mg total) by mouth daily for 5 days. 06/18/20 06/23/20  Kendell BaneShahmehdi, Seyed A, MD  Multiple Vitamin (MULTIVITAMIN WITH MINERALS) TABS tablet Take 1 tablet by mouth daily. 06/19/20 07/19/20  Shahmehdi, Seyed A, MD  nicotine (NICODERM CQ - DOSED IN MG/24 HOURS) 21 mg/24hr patch Place 1 patch (21 mg total) onto the skin daily. Patient not taking: Reported on 06/07/2020 05/09/20   Burnadette Pop, MD  pantoprazole (PROTONIX) 40 MG tablet Take 1 tablet (40 mg total) by mouth daily. Patient not taking: Reported on 06/07/2020 05/09/20   Burnadette Pop, MD  sucralfate (CARAFATE) 1 GM/10ML suspension Take 10 mLs (1 g total) by mouth 4 (four) times daily -  with meals and at  bedtime for 14 days. Patient not taking: Reported on 06/07/2020 05/09/20 06/07/20  Burnadette Pop, MD  thiamine 100 MG tablet Take 1 tablet (100 mg total) by mouth daily for 5 days. 06/18/20 06/23/20  Shahmehdi, Gemma Payor, MD  famotidine (PEPCID) 20 MG tablet Take 1 tablet (20 mg total) by mouth 2 (two) times daily. Patient not taking: Reported on 06/01/2019 05/14/19 06/02/19  Benjiman Core, MD    Allergies    Tegretol [carbamazepine] and Caffeine  Review of Systems   Review of Systems  Unable to perform ROS: Psychiatric disorder    Physical Exam Updated Vital Signs BP 114/78   Pulse 89   Temp 97.8 F (36.6 C)   Resp 15   Ht 6\' 1"  (1.854 m)   Wt 68 kg   SpO2 94%   BMI 19.78 kg/m   Physical Exam Vitals and nursing note reviewed.  Constitutional:      General: He is not in acute distress.    Appearance: He is well-developed. He is not diaphoretic.     Comments: awake  HENT:     Head: Normocephalic and atraumatic.     Nose: Nose normal.     Mouth/Throat:     Mouth: Mucous membranes are moist.  Eyes:     Pupils: Pupils are equal, round, and reactive to light.  Cardiovascular:     Rate and Rhythm: Normal rate and regular rhythm.     Pulses: Normal pulses.     Heart sounds: Normal heart sounds.  Pulmonary:     Effort: Pulmonary effort is normal.     Breath sounds: Normal breath sounds.  Abdominal:     General: There is no distension.     Palpations: Abdomen is soft.  Musculoskeletal:     Right lower leg: No edema.     Left lower leg: No edema.     Comments: Tenderness to generalized back/body, no step offs, no crepitus, no ecchymosis.   Skin:    General: Skin is warm and dry.     Findings: No rash.  Neurological:     General: No focal deficit present.  Psychiatric:        Attention and Perception: He perceives visual hallucinations.        Mood and Affect: Affect is labile.        Speech: Speech is delayed and tangential.        Behavior: Behavior is slowed and  withdrawn.        Thought Content: Thought content is not paranoid. Thought content includes suicidal ideation. Thought content does not include homicidal ideation. Thought content includes suicidal plan. Thought content does not include homicidal plan.     ED Results / Procedures / Treatments   Labs (all labs ordered are listed, but only abnormal results are displayed) Labs Reviewed  COMPREHENSIVE METABOLIC PANEL - Abnormal; Notable for the following components:      Result Value   Potassium 3.2 (*)  Glucose, Bld 144 (*)    BUN <5 (*)    AST 61 (*)    ALT 65 (*)    Anion gap 17 (*)    All other components within normal limits  ETHANOL - Abnormal; Notable for the following components:   Alcohol, Ethyl (B) 436 (*)    All other components within normal limits  SALICYLATE LEVEL - Abnormal; Notable for the following components:   Salicylate Lvl <7.0 (*)    All other components within normal limits  ACETAMINOPHEN LEVEL - Abnormal; Notable for the following components:   Acetaminophen (Tylenol), Serum <10 (*)    All other components within normal limits  CBC - Abnormal; Notable for the following components:   RBC 3.76 (*)    Hemoglobin 12.8 (*)    HCT 37.0 (*)    All other components within normal limits  SARS CORONAVIRUS 2 BY RT PCR (HOSPITAL ORDER, PERFORMED IN [facility])  MAGNESIUM  RAPID URINE DRUG SCREEN, HOSP PERFORMED    EKG None  Radiology No results found.  Procedures Procedures (including critical care time)  Medications Ordered in ED Medications  LORazepam (ATIVAN) injection 0-4 mg (0 mg Intravenous Not Given 06/23/20 2250)    Or  LORazepam (ATIVAN) tablet 0-4 mg ( Oral See Alternative 06/23/20 2250)  LORazepam (ATIVAN) injection 0-4 mg (has no administration in time range)    Or  LORazepam (ATIVAN) tablet 0-4 mg (has no administration in time range)  thiamine tablet 100 mg (has no administration in time range)    Or  thiamine (B-1)  injection 100 mg (has no administration in time range)  acetaminophen (TYLENOL) tablet 650 mg (has no administration in time range)  ondansetron (ZOFRAN) tablet 4 mg (has no administration in time range)  alum & mag hydroxide-simeth (MAALOX/MYLANTA) 200-200-20 MG/5ML suspension 30 mL (has no administration in time range)  nicotine (NICODERM CQ - dosed in mg/24 hours) patch 21 mg (has no administration in time range)  sodium chloride 0.9 % 1,000 mL with thiamine 100 mg, folic acid 1 mg, multivitamins adult 10 mL infusion (has no administration in time range)    ED Course  I have reviewed the triage vital signs and the nursing notes.  Pertinent labs & imaging results that were available during my care of the patient were reviewed by me and considered in my medical decision making (see chart for details).  Clinical Course as of Jun 23 2320  Wynelle Link Jun 23, 2020  [id number] 34 year old male brought in by EMS intoxicated with plan to overdose on alcohol, states if he were to leave the ER he would get a gun and shoot himself.  Appears to be responding to visual or internal stimuli, he is twitching at times, does not participate well in history or exam, states his whole body hurts but denies recent trauma. Due to alcohol intoxication, unable to evaluate patient for head injury and C-spine injury, CT imaging ordered. CIWA monitoring ordered as well as IV fluids. Labs returned with alcohol of 436.  CBC with slight anemia with hemoglobin of 12.8.  CMP with potassium 3.2 and elevated LFTs.  Plan is to check magnesium. If patient tries to leave, will plan to IVC this patient.  This time he is cooperative, lying in bed sleeping.   [LM]    Clinical Course User Index [LM] Alden Hipp   MDM Rules/Calculators/A&P  Final Clinical Impression(s) / ED Diagnoses Final diagnoses:  Suicidal ideation  Alcohol abuse  Alcohol poisoning, intentional self-harm, initial encounter [facility])     Rx / DC Orders ED Discharge Orders    None       Jeannie Fend, PA-C 06/23/20 2326    Derwood Kaplan, MD 06/24/20 2356

## 2020-06-24 ENCOUNTER — Encounter (HOSPITAL_COMMUNITY): Payer: Self-pay | Admitting: Registered Nurse

## 2020-06-24 DIAGNOSIS — T5191XA Toxic effect of unspecified alcohol, accidental (unintentional), initial encounter: Secondary | ICD-10-CM | POA: Insufficient documentation

## 2020-06-24 DIAGNOSIS — R45851 Suicidal ideations: Secondary | ICD-10-CM

## 2020-06-24 DIAGNOSIS — F1010 Alcohol abuse, uncomplicated: Secondary | ICD-10-CM | POA: Insufficient documentation

## 2020-06-24 DIAGNOSIS — T5192XA Toxic effect of unspecified alcohol, intentional self-harm, initial encounter: Secondary | ICD-10-CM

## 2020-06-24 LAB — RAPID URINE DRUG SCREEN, HOSP PERFORMED
Amphetamines: NOT DETECTED
Barbiturates: NOT DETECTED
Benzodiazepines: NOT DETECTED
Cocaine: NOT DETECTED
Opiates: NOT DETECTED
Tetrahydrocannabinol: NOT DETECTED

## 2020-06-24 LAB — SARS CORONAVIRUS 2 BY RT PCR (HOSPITAL ORDER, PERFORMED IN ~~LOC~~ HOSPITAL LAB): SARS Coronavirus 2: NEGATIVE

## 2020-06-24 NOTE — Consult Note (Deleted)
  Gary [name], 34 y.o., male patient seen via tele psych by this provider, consulted with Dr. Lucianne Muss; and chart reviewed on 06/24/20.  On evaluation [name] reports he is suicidal and trying to drink himself to death.  Patient states that he is currently homeless and has no outpatient psychiatric services.  Patient unable to contract for safety.    During evaluation [name] is alert/oriented x 4; calm/cooperative; and mood is congruent with affect.  She does not appear to be responding to internal/external stimuli or delusional thoughts.  Patient states that he is hear ing voices at times but doesn't state if he is hearing voices now.  Patient has history of alcohol induced pancreatitis and has had multiple hospital visit related to alcohol use disorder.  Peer support ordered   Disposition: Recommend psychiatric Inpatient admission when medically cleared.

## 2020-06-24 NOTE — ED Provider Notes (Signed)
Emergency Medicine Observation Re-evaluation Note  Gary [name] is a 34 y.o. male, seen on rounds today.  Pt initially presented to the ED for complaints of Suicidal Currently, the patient is sitting comfortably in bed complaining of some nausea and body aches but no other complaints at this time awaiting consultation.   Physical Exam  BP 119/90   Pulse (!) 102   Temp 97.8 F (36.6 C)   Resp 16   Ht 6\' 1"  (1.854 m)   Wt 68 kg   SpO2 99%   BMI 19.78 kg/m   CONSTITUTIONAL:  well-appearing, NAD, mildly NEURO:  Alert and oriented x 3, no focal deficits, able answer questions appropriately follows commands, mildly tremulous EYES:  pupils equal and reactive ENT/NECK:  trachea midline, no JVD CARDIO:  reg rate, reg rhythm, well-perfused PULM:  None labored breathing GI/GU:  Abdomen non-distended, soft, generalized tenderness no focal tenderness or guarding. MSK/SPINE:  No gross deformities, no edema SKIN:  no rash obvious, atraumatic, no ecchymosis  PSYCH:  Appropriate speech and behavior  ED Course / MDM  EKG:EKG Interpretation  Date/Time:  Sunday June 23 2020 23:54:37 EDT Ventricular Rate:  96 PR Interval:  152 QRS Duration: 98 QT Interval:  362 QTC Calculation: 457 R Axis:   76 Text Interpretation: Normal sinus rhythm Septal infarct , age undetermined Cannot rule out Inferior infarct , age undetermined T wave abnormality, consider anterolateral ischemia Abnormal ECG Confirmed by [id number] (Paula Libra) on 06/23/2020 11:59:00 PM  Clinical Course as of Jun 25 1043  Sun Jun 23, 2020  [id number] 34 year old male brought in by EMS intoxicated with plan to overdose on alcohol, states if he were to leave the ER he would get a gun and shoot himself.  Appears to be responding to visual or internal stimuli, he is twitching at times, does not participate well in history or exam, states his whole body hurts but denies recent trauma. Due to alcohol intoxication, unable to evaluate patient for  head injury and C-spine injury, CT imaging ordered. CIWA monitoring ordered as well as IV fluids. Labs returned with alcohol of 436.  CBC with slight anemia with hemoglobin of 12.8.  CMP with potassium 3.2 and elevated LFTs.  Plan is to check magnesium. If patient tries to leave, will plan to IVC this patient.  This time he is cooperative, lying in bed sleeping.   [LM]    Clinical Course User Index [LM] 32, PA-C   I have reviewed the labs performed to date as well as medications administered while in observation.  Recent changes in the last 24 hours include 1 L normal saline as well as thiamine and folic acid. Plan   Current plan is for psychiatric evaluation--still endorsing SI.  My reevaluation patient he is somewhat tremulous.  Will have patient reassessed by nursing and have a formal CIWA score obtained.  Suspect he would benefit from some Ativan at this time.  We will also provide patient with some Zofran.  He received 2 mg IV by nursing staff.  Patient is not under full IVC at this time.   On my reassessment patient is uncomfortable in bed no longer tremulous appears less anxious.  Awaiting TTS consultation.    Jeannie Fend [place], DOLE 06/24/20 1052    06/26/20, MD 06/24/20 1140

## 2020-06-24 NOTE — ED Notes (Signed)
Patient back from talking with TTS. Patient received lunch tray.

## 2020-06-24 NOTE — ED Notes (Signed)
Pt wanded by security. 

## 2020-06-24 NOTE — ED Notes (Signed)
Pt. Changed into burgundy scrubs. Belongings in [facility] pt belongings cabinet.

## 2020-06-24 NOTE — ED Notes (Signed)
Patient received breakfast tray 

## 2020-06-24 NOTE — Consult Note (Signed)
Telepsych Consultation   Reason for Consult:  Suicidal ideation Referring Physician:  Alden Hipp Location of Patient:  [facility] ED Location of Provider: Other: Florinda Marker  Patient Identification: Gary [name] MRN:  [id number] Principal Diagnosis: <principal problem not specified> Diagnosis:  Active Problems:   * No active hospital problems. *   Total Time spent with patient: 30 minutes  Subjective:   Gary [name] is a 34 y.o. male patient admitted [facility] ED wit complaints of alcohol use disorder and suicidal ideation.  HPI:  Gary [name], 34 y.o., male patient seen via tele psych by this provider, consulted with Dr. Lucianne Muss; and chart reviewed on 06/24/20.  On evaluation Gary [name] reports that he is suicidal and has been trying to drink himself to death.  States that his friends no longer want to be around him because he can't stop drinking.  States that he is homeless, unemployed and has no reason to live.  Patient states that he hears voices at times but doesn't admit to hearing voices at this time.  Patient unable to contract for safety. During evaluation Gary [name] is alert/oriented x 4; calm/cooperative; and mood is congruent with affect.  He does not appear to be responding to internal/external stimuli or delusional thoughts.  Patient denies homicidal ideation,paranoia.  Patient answered question appropriately.     Past Psychiatric History: Major depression, alcohol abuse  Risk to Self:   Risk to Others:   Prior Inpatient Therapy:   Prior Outpatient Therapy:    Past Medical History:  Past Medical History:  Diagnosis Date  . Alcohol abuse   . HIV (human immunodeficiency virus infection) (HCC)   . Subdural hematoma [facility])     Past Surgical History:  Procedure Laterality Date  . INCISION AND DRAINAGE PERIRECTAL ABSCESS N/A 09/24/2016   Procedure: IRRIGATION AND DEBRIDEMENT PERIRECTAL ABSCESS;  Surgeon: Ricarda Frame, MD;  Location: [facility] ORS;  Service:  General;  Laterality: N/A;  . none     Family History:  Family History  Problem Relation Age of Onset  . Alcohol abuse Mother   . Alcohol abuse Father    Family Psychiatric  History: See above Social History:  Social History   Substance and Sexual Activity  Alcohol Use Yes  . Alcohol/week: 3.0 standard drinks  . Types: 3 Cans of beer per week   Comment: 24 oz     Social History   Substance and Sexual Activity  Drug Use Not Currently  . Types: Marijuana, Methamphetamines   Comment: last used 04/10/2019    Social History   Socioeconomic History  . Marital status: Single    Spouse name: Not on file  . Number of children: Not on file  . Years of education: Not on file  . Highest education level: Not on file  Occupational History  . Occupation: unemployed  Tobacco Use  . Smoking status: Former Smoker    Packs/day: 0.50    Types: Cigarettes  . Smokeless tobacco: Never Used  . Tobacco comment: unable to smoke while incarcerated 6+ months 02/13/20  Vaping Use  . Vaping Use: Never used  Substance and Sexual Activity  . Alcohol use: Yes    Alcohol/week: 3.0 standard drinks    Types: 3 Cans of beer per week    Comment: 24 oz  . Drug use: Not Currently    Types: Marijuana, Methamphetamines    Comment: last used 04/10/2019  . Sexual activity: Not Currently    Comment: unable  to offer condoms while incarcerated  Other Topics Concern  . Not on file  Social History Narrative   "Currently living on the streets"   Independent at baseline   Social Determinants of Health   Financial Resource Strain:   . Difficulty of Paying Living Expenses:   Food Insecurity:   . Worried About Programme researcher, broadcasting/film/video in the Last Year:   . Barista in the Last Year:   Transportation Needs:   . Freight forwarder (Medical):   Marland Kitchen Lack of Transportation (Non-Medical):   Physical Activity:   . Days of Exercise per Week:   . Minutes of Exercise per Session:   Stress:   . Feeling of  Stress :   Social Connections:   . Frequency of Communication with Friends and Family:   . Frequency of Social Gatherings with Friends and Family:   . Attends Religious Services:   . Active Member of Clubs or Organizations:   . Attends Banker Meetings:   Marland Kitchen Marital Status:    Additional Social History:    Allergies:   Allergies  Allergen Reactions  . Tegretol [Carbamazepine] Other (See Comments)    Caused vertigo for 2 days after taking it  . Caffeine Palpitations    Labs:  Results for orders placed or performed during the hospital encounter of 06/23/20 (from the past 48 hour(s))  Comprehensive metabolic panel     Status: Abnormal   Collection Time: 06/23/20  9:39 PM  Result Value Ref Range   Sodium 144 135 - 145 mmol/L   Potassium 3.2 (L) 3.5 - 5.1 mmol/L   Chloride 101 98 - 111 mmol/L   CO2 26 22 - 32 mmol/L   Glucose, Bld 144 (H) 70 - 99 mg/dL    Comment: Glucose reference range applies only to samples taken after fasting for at least 8 hours.   BUN <5 (L) 6 - 20 mg/dL   Creatinine, Ser 7.86 0.61 - 1.24 mg/dL   Calcium 8.9 8.9 - 75.4 mg/dL   Total Protein 7.4 6.5 - 8.1 g/dL   Albumin 4.5 3.5 - 5.0 g/dL   AST 61 (H) 15 - 41 U/L   ALT 65 (H) 0 - 44 U/L   Alkaline Phosphatase 54 38 - 126 U/L   Total Bilirubin 0.4 0.3 - 1.2 mg/dL   GFR calc non Af Amer >60 >60 mL/min   GFR calc Af Amer >60 >60 mL/min   Anion gap 17 (H) 5 - 15    Comment: Performed at [facility], [address], [place], [place] [address]  Ethanol     Status: Abnormal   Collection Time: 06/23/20  9:39 PM  Result Value Ref Range   Alcohol, Ethyl (B) 436 (HH) <10 mg/dL    Comment: CRITICAL RESULT CALLED TO, READ BACK BY AND VERIFIED WITH: J,TALKINGTON AT 2225 ON 06/23/20 BY A,MOHAMED (NOTE) Lowest detectable limit for serum alcohol is 10 mg/dL.  For medical purposes only. Performed at [facility], [address] [place], [place] [address]    Salicylate level     Status: Abnormal   Collection Time: 06/23/20  9:39 PM  Result Value Ref Range   Salicylate Lvl <7.0 (L) 7.0 - 30.0 mg/dL    Comment: Performed at [facility], [address], [place], [place] [address]  Acetaminophen level     Status: Abnormal   Collection Time: 06/23/20  9:39 PM  Result Value Ref Range  Acetaminophen (Tylenol), Serum <10 (L) 10 - 30 ug/mL    Comment: (NOTE) Therapeutic concentrations vary significantly. A range of 10-30 ug/mL  may be an effective concentration for many patients. However, some  are best treated at concentrations outside of this range. Acetaminophen concentrations >150 ug/mL at 4 hours after ingestion  and >50 ug/mL at 12 hours after ingestion are often associated with  toxic reactions.  Performed at [facility], [address] [place], [place] [address]   cbc     Status: Abnormal   Collection Time: 06/23/20  9:39 PM  Result Value Ref Range   WBC 5.0 4.0 - 10.5 K/uL   RBC 3.76 (L) 4.22 - 5.81 MIL/uL   Hemoglobin 12.8 (L) 13.0 - 17.0 g/dL   HCT 60.437.0 (L) 39 - 52 %   MCV 98.4 80.0 - 100.0 fL   MCH 34.0 26.0 - 34.0 pg   MCHC 34.6 30.0 - 36.0 g/dL   RDW 54.014.0 98.111.5 - 19.115.5 %   Platelets 400 150 - 400 K/uL   nRBC 0.0 0.0 - 0.2 %    Comment: Performed at [facility], [address] [place], [place] [address]  Magnesium     Status: None   Collection Time: 06/23/20  9:39 PM  Result Value Ref Range   Magnesium 2.1 1.7 - 2.4 mg/dL    Comment: Performed at [facility], [address] [place], [place] [address]  SARS Coronavirus 2 by RT PCR (hospital order, performed in [facility] hospital lab) Nasopharyngeal Nasopharyngeal Swab     Status: None   Collection Time: 06/23/20 11:47 PM   Specimen: Nasopharyngeal Swab  Result Value Ref Range   SARS Coronavirus 2 NEGATIVE NEGATIVE    Comment: (NOTE) SARS-CoV-2 target nucleic acids are NOT DETECTED.  The  SARS-CoV-2 RNA is generally detectable in upper and lower respiratory specimens during the acute phase of infection. The lowest concentration of SARS-CoV-2 viral copies this assay can detect is 250 copies / mL. A negative result does not preclude SARS-CoV-2 infection and should not be used as the sole basis for treatment or other patient management decisions.  A negative result may occur with improper specimen collection / handling, submission of specimen other than nasopharyngeal swab, presence of viral mutation(s) within the areas targeted by this assay, and inadequate number of viral copies (<250 copies / mL). A negative result must be combined with clinical observations, patient history, and epidemiological information.  Fact Sheet for Patients:   BoilerBrush.com.cyhttps://www.fda.gov/media/136312/download  Fact Sheet for Healthcare Providers: https://pope.com/https://www.fda.gov/media/136313/download  This test is not yet approved or  cleared by the [place] FDA and has been authorized for detection and/or diagnosis of SARS-CoV-2 by FDA under an Emergency Use Authorization (EUA).  This EUA will remain in effect (meaning this test can be used) for the duration of the COVID-19 declaration under Section 564(b)(1) of the Act, 21 U.S.C. section 360bbb-3(b)(1), unless the authorization is terminated or revoked sooner.  Performed at [facility], [address] [place], [place] [address]   Rapid urine drug screen (hospital performed)     Status: None   Collection Time: 06/24/20  3:54 AM  Result Value Ref Range   Opiates NONE DETECTED NONE DETECTED   Cocaine NONE DETECTED NONE DETECTED   Benzodiazepines NONE DETECTED NONE DETECTED   Amphetamines NONE DETECTED NONE DETECTED   Tetrahydrocannabinol NONE DETECTED NONE DETECTED   Barbiturates NONE DETECTED NONE DETECTED    Comment: (NOTE) DRUG  SCREEN FOR MEDICAL PURPOSES ONLY.  IF CONFIRMATION IS NEEDED FOR ANY PURPOSE, NOTIFY LAB WITHIN 5  DAYS.  LOWEST DETECTABLE LIMITS FOR URINE DRUG SCREEN Drug Class                     Cutoff (ng/mL) Amphetamine and metabolites    1000 Barbiturate and metabolites    200 Benzodiazepine                 200 Tricyclics and metabolites     300 Opiates and metabolites        300 Cocaine and metabolites        300 THC                            50 Performed at [facility], [address], [place], [place] [address]     Medications:  Current Facility-Administered Medications  Medication Dose Route Frequency Provider Last Rate Last Admin  . acetaminophen (TYLENOL) tablet 650 mg  650 mg Oral Q4H PRN Jeannie Fend, PA-C      . alum & mag hydroxide-simeth (MAALOX/MYLANTA) 200-200-20 MG/5ML suspension 30 mL  30 mL Oral Q6H PRN Jeannie Fend, PA-C      . LORazepam (ATIVAN) injection 0-4 mg  0-4 mg Intravenous Q6H Army Melia A, PA-C   2 mg at 06/24/20 1435   Or  . LORazepam (ATIVAN) tablet 0-4 mg  0-4 mg Oral Q6H Jeannie Fend, PA-C      . [START ON 06/26/2020] LORazepam (ATIVAN) injection 0-4 mg  0-4 mg Intravenous Q12H Jeannie Fend, PA-C       Or  . Melene Muller ON 06/26/2020] LORazepam (ATIVAN) tablet 0-4 mg  0-4 mg Oral Q12H Army Melia A, PA-C      . nicotine (NICODERM CQ - dosed in mg/24 hours) patch 21 mg  21 mg Transdermal Daily Army Melia A, PA-C      . ondansetron [facility]) tablet 4 mg  4 mg Oral Q8H PRN Army Melia A, PA-C   4 mg at 06/24/20 1331  . thiamine tablet 100 mg  100 mg Oral Daily Army Melia A, PA-C       Or  . thiamine (B-1) injection 100 mg  100 mg Intravenous Daily Army Melia A, PA-C   100 mg at 06/24/20 8527   Current Outpatient Medications  Medication Sig Dispense Refill  . elvitegravir-cobicistat-emtricitabine-tenofovir (GENVOYA) 150-150-200-10 MG TABS tablet Take 1 tablet by mouth daily with breakfast. 30 tablet 1  . Multiple Vitamin (MULTIVITAMIN WITH MINERALS) TABS tablet Take 1 tablet by mouth daily. 30 tablet 3  . FLUoxetine  (PROZAC) 20 MG capsule Take 1 capsule (20 mg total) by mouth daily. 30 capsule 3  . nicotine (NICODERM CQ - DOSED IN MG/24 HOURS) 21 mg/24hr patch Place 1 patch (21 mg total) onto the skin daily. (Patient not taking: Reported on 06/07/2020) 28 patch 0  . pantoprazole (PROTONIX) 40 MG tablet Take 1 tablet (40 mg total) by mouth daily. (Patient not taking: Reported on 06/07/2020) 30 tablet 1  . sucralfate (CARAFATE) 1 GM/10ML suspension Take 10 mLs (1 g total) by mouth 4 (four) times daily -  with meals and at bedtime for 14 days. (Patient not taking: Reported on 06/07/2020) 560 mL 0    Musculoskeletal: Strength & Muscle Tone: within normal limits Gait & Station: normal Patient leans: N/A  Psychiatric Specialty Exam: Physical Exam Pulmonary:  Effort: Pulmonary effort is normal.  Musculoskeletal:        General: Normal range of motion.  Neurological:     Mental Status: He is alert.     Review of Systems  Blood pressure 119/90, pulse (!) 102, temperature 97.8 F (36.6 C), resp. rate 16, height  (1.854 m), weight 68 kg, SpO2 99 %.Body mass index is 19.78 kg/m.  General Appearance: Casual  Eye Contact:  Good  Speech:  Clear and Coherent and Normal Rate  Volume:  Normal  Mood:  Depressed  Affect:  Congruent and Depressed  Thought Process:  Coherent, Goal Directed and Descriptions of Associations: Intact  Orientation:  Full (Time, Place, and Person)  Thought Content:  Hallucinations: Auditory  Suicidal Thoughts:  Yes.  with intent/plan  Homicidal Thoughts:  No  Memory:  Immediate;   Good Recent;   Good  Judgement:  Impaired  Insight:  Lacking  Psychomotor Activity:  Tremor  Concentration:  Concentration: Good and Attention Span: Good  Recall:  Good  Fund of Knowledge:  Fair  Language:  Good  Akathisia:  No  Handed:  [name]  AIMS (if indicated):     Assets:  Communication Skills Desire for Improvement  ADL's:  Intact  Cognition:  WNL  Sleep:        Treatment Plan  Summary: Daily contact with patient to assess and evaluate symptoms and progress in treatment, Medication management and Plan Inpatient psychiatric treatment  Disposition: Recommend psychiatric Inpatient admission when medically cleared.  This service was provided via telemedicine using a 2-way, interactive audio and video technology.  Names of all persons participating in this telemedicine service and their role in this encounter. Name: Assunta Found Role: NP  Name: Dr. Nelly Rout Role: Psychiatrist  Name: Gary [name] Role: Patient  Name:  Role:     Assunta Found, NP 06/24/2020 2:53 PM

## 2020-06-24 NOTE — BH Assessment (Signed)
BHH Assessment Progress Note  Per Assunta Found, FNP this voluntary patient requires psychiatric hospitalization at this time.  Pt is currently being staffed with [facility] and with [facility]  Final disposition is pending as of this writing.  Doylene Canning, [place] Behavioral Health Coordinator [phone]

## 2020-06-25 NOTE — BH Assessment (Signed)
BHH Assessment Progress Note  Per Shuvon Rankin, NP, this pt does not require psychiatric hospitalization at this time.  Pt is to be discharged from [facility] with outpatient referrals.  These have been included in pt's discharge instructions.  Pt's nurse, Waynetta Sandy, has been notified.  Doylene Canning, MA Triage Specialist [phone]

## 2020-06-25 NOTE — Patient Outreach (Signed)
ED Peer Support Specialist Patient Intake (Complete at intake & 30-60 Day Follow-up)  Name: Gary [name]  MRN: [id number]  Age: 34 y.o.   Date of Admission: 06/25/2020  Intake: Initial Comments:      Primary Reason Admitted: Suicidal   Lab values: Alcohol/ETOH: Positive Positive UDS? Yes Amphetamines: No Barbiturates: Yes Benzodiazepines: No Cocaine: No Opiates: No Cannabinoids: No  Demographic information: Gender: Male Ethnicity: White Marital Status: Single Insurance Status: Uninsured/Self-pay Control and instrumentation engineer (Work Engineer, agricultural, Sales executive, etc.: No Lives with: Partner/Spouse Living situation: House/Apartment  Reported Patient History: Patient reported health conditions: None Patient aware of HIV and hepatitis status: No  In past year, has patient visited ED for any reason? No  Number of ED visits:    Reason(s) for visit:    In past year, has patient been hospitalized for any reason? Yes  Number of hospitalizations: 5  Reason(s) for hospitalization: various an same reasons  In past year, has patient been arrested? No  Number of arrests:    Reason(s) for arrest:    In past year, has patient been incarcerated? No  Number of incarcerations:    Reason(s) for incarceration:    In past year, has patient received medication-assisted treatment?    In past year, patient received the following treatments:    In past year, has patient received any harm reduction services? No  Did this include any of the following?    In past year, has patient received care from a mental health provider for diagnosis other than SUD? No  In past year, is this first time patient has overdosed? No  Number of past overdoses:    In past year, is this first time patient has been hospitalized for an overdose? No  Number of hospitalizations for overdose(s):    Is patient currently receiving treatment for a mental health diagnosis? No  Patient reports  experiencing difficulty participating in SUD treatment: No    Most important reason(s) for this difficulty?    Has patient received prior services for treatment? No  In past, patient has received services from following agencies:    Plan of Care:  Suggested follow up at these agencies/treatment centers:    Other information: CPSS was able to complete series of questions an able to gain information to better assist Pt. CPSS processed with Pt about various services that Pt may benefit from receiving. CPSS asked Pt if he still felt as if he wanted to harm himself or others. CPSS processed with Pt about attending AA meetings or possible a detox program. Pt stated that his mother is a Marketing executive an that he will be alright speaking with her about receiving help from her. CPSS left contact information for Pt, if he is ready an in need of assistance of his time.    Arlys John Pamla Pangle, CPSS  06/25/2020 10:46 AM

## 2020-06-25 NOTE — BHH Suicide Risk Assessment (Cosign Needed)
Suicide Risk Assessment  Discharge Assessment   [facility] Discharge Suicide Risk Assessment   Principal Problem: <principal problem not specified> Discharge Diagnoses: Active Problems:   * No active hospital problems. *   Total Time spent with patient: 30 minutes  Musculoskeletal: Strength & Muscle Tone: within normal limits Gait & Station: normal Patient leans: N/A  Psychiatric Specialty Exam: Review of Systems  Psychiatric/Behavioral: Negative for memory loss. Depression: Stable. Hallucinations: Denies. Substance abuse: Alcohol. Suicidal ideas: Denies. The patient does not have insomnia.   All other systems reviewed and are negative.    Blood pressure 121/81, pulse 68, temperature 98.9 F (37.2 C), temperature source Oral, resp. rate 20, height 6\' 1"  (1.854 m), weight 68 kg, SpO2 99 %.Body mass index is 19.78 kg/m.  General Appearance: Casual  Eye Contact::  Good  Speech:  Clear and Coherent and Normal Rate409  Volume:  Normal  Mood:  "Much better  Affect:  Appropriate and Congruent  Thought Process:  Coherent, Goal Directed and Descriptions of Associations: Intact  Orientation:  Full (Time, Place, and Person)  Thought Content:  WDL  Suicidal Thoughts:  No  Homicidal Thoughts:  No  Memory:  Immediate;   Good Recent;   Good  Judgement:  Intact  Insight:  Fair  Psychomotor Activity:  Normal  Concentration:  Good  Recall:  Good  Fund of Knowledge:Good  Language: Good  Akathisia:  No  Handed:  Right  AIMS (if indicated):     Assets:  Communication Skills Desire for Improvement Housing  Sleep:     Cognition: WNL  ADL's:  Intact   Mental Status Per Nursing Assessment::   On Admission:     Demographic Factors:  Male, Caucasian and Unemployed  Loss Factors: NA Gary [name], 34 y.o., male patient seen via tele psych by this provider, consulted with Dr. 32; and chart reviewed on 06/25/20.  On evaluation Gary [name] reports he is feeling better today.   States that he was intoxicated today and that he is going to stay with his mother in [place] and can do outpatient psychiatric services there.  Patient states that he is not feeling suicidal and feels that he can better refrain from drinking if he is at his mothers house because she also goes to AA and she could be his sponsor.   During evaluation Gary [name] is alert/oriented x 4; calm/cooperative; and mood is congruent with affect.  He does not appear to be responding to internal/external stimuli or delusional thoughts.  Patient denies suicidal/self-harm/homicidal ideation, psychosis, and paranoia.  Patient answered question appropriately.    Historical Factors: Impulsivity  Risk Reduction Factors:   Religious beliefs about death, Living with another person, especially a relative and Positive social support  Continued Clinical Symptoms:  Alcohol/Substance Abuse/Dependencies  Cognitive Features That Contribute To Risk:  None    Suicide Risk:  Minimal: No identifiable suicidal ideation.  Patients presenting with no risk factors but with morbid ruminations; may be classified as minimal risk based on the severity of the depressive symptoms  Plan Of Care/Follow-up recommendations:  Activity:  As tolerated Diet:  Heart healthy    Discharge Instructions     For your behavioral health needs, you are advised to follow up with one of the following outpatient providers.  Contact them at your earliest opportunity to schedule an intake appointment:       [facility]      [address]      [place],  [place] [address]      [phone]       [facility]      [address]      [place], [place] [address]      [phone]    Disposition: Psychiatrically cleared No evidence of imminent risk to self or others at present.   Patient does not meet criteria for psychiatric inpatient admission. Supportive therapy provided about ongoing stressors. Discussed crisis plan,  support from social network, calling 911, coming to the Emergency Department, and calling Suicide Hotline.  Ashlee Bewley, NP 06/25/2020, 1:32 PM

## 2020-06-25 NOTE — ED Notes (Signed)
Patient calm sitting on bed watching tv

## 2020-06-25 NOTE — ED Notes (Signed)
Pt DCd off unit to home per provider. Pt alert, calm, cooperative, no s/s of distress. DC information and resources given to and reviewed with pt, pt acknowledged understanding.  Belongings given to pt.Pt ambulatory off unit, escorted by RN.  Pt using bus for transportation.

## 2020-06-25 NOTE — ED Notes (Signed)
Patient received lunch tray. Patient proceeded to come out of his room.  Patient is getting a bit anxious and wants to be discharged. Patient stated he was put in hospital for drinking problem and wishes to stop.

## 2020-06-25 NOTE — ED Notes (Signed)
Patient stood at door saying that he is being discharged and requested his clothing.

## 2020-06-25 NOTE — ED Notes (Signed)
Patient released

## 2020-06-25 NOTE — ED Notes (Signed)
Patient resting quietly on bed  

## 2020-06-25 NOTE — ED Notes (Signed)
Patient calm, laying on bed watching tv

## 2020-06-25 NOTE — ED Notes (Signed)
Patient received breakfast tray 

## 2020-06-25 NOTE — Discharge Instructions (Addendum)
For your behavioral health needs, you are advised to follow up with one of the following outpatient providers.  Contact them at your earliest opportunity to schedule an intake appointment:       [facility]      [address]      [place], [place] [address]      [phone]       [facility]      [address]      [place], [place] [address]      [phone]

## 2020-06-25 NOTE — ED Provider Notes (Signed)
No events overnight. He says he feels somewhat better currently. Was able to get some rest. At some breakfast this morning. Awaiting disposition.   Vitals:   06/25/20 0010 06/25/20 0500  BP: (!) 127/95 123/86  Pulse: 95 69  Resp: 19 18  Temp:    SpO2: 100% 97%      Raeford Razor, MD 06/25/20 [phone]

## 2020-06-28 ENCOUNTER — Emergency Department (HOSPITAL_COMMUNITY): Payer: Self-pay

## 2020-06-28 ENCOUNTER — Other Ambulatory Visit: Payer: Self-pay

## 2020-06-28 ENCOUNTER — Encounter (HOSPITAL_COMMUNITY): Payer: Self-pay | Admitting: Emergency Medicine

## 2020-06-28 ENCOUNTER — Emergency Department (HOSPITAL_COMMUNITY)
Admission: EM | Admit: 2020-06-28 | Discharge: 2020-06-29 | Disposition: A | Discharge status: Home / Self Care | Payer: Self-pay | Attending: Emergency Medicine | Admitting: Emergency Medicine

## 2020-06-28 DIAGNOSIS — F1994 Other psychoactive substance use, unspecified with psychoactive substance-induced mood disorder: Secondary | ICD-10-CM | POA: Insufficient documentation

## 2020-06-28 DIAGNOSIS — Z20822 Contact with and (suspected) exposure to covid-19: Secondary | ICD-10-CM | POA: Insufficient documentation

## 2020-06-28 DIAGNOSIS — R45850 Homicidal ideations: Secondary | ICD-10-CM | POA: Insufficient documentation

## 2020-06-28 DIAGNOSIS — R45851 Suicidal ideations: Secondary | ICD-10-CM | POA: Insufficient documentation

## 2020-06-28 DIAGNOSIS — F1010 Alcohol abuse, uncomplicated: Secondary | ICD-10-CM

## 2020-06-28 DIAGNOSIS — Z87891 Personal history of nicotine dependence: Secondary | ICD-10-CM | POA: Insufficient documentation

## 2020-06-28 DIAGNOSIS — F10920 Alcohol use, unspecified with intoxication, uncomplicated: Secondary | ICD-10-CM

## 2020-06-28 DIAGNOSIS — Z79899 Other long term (current) drug therapy: Secondary | ICD-10-CM | POA: Insufficient documentation

## 2020-06-28 DIAGNOSIS — F10129 Alcohol abuse with intoxication, unspecified: Secondary | ICD-10-CM | POA: Insufficient documentation

## 2020-06-28 DIAGNOSIS — R0602 Shortness of breath: Secondary | ICD-10-CM | POA: Insufficient documentation

## 2020-06-28 LAB — CBC WITH DIFFERENTIAL/PLATELET
Abs Immature Granulocytes: 0.01 10*3/uL (ref 0.00–0.07)
Basophils Absolute: 0.1 10*3/uL (ref 0.0–0.1)
Basophils Relative: 2 %
Eosinophils Absolute: 0 10*3/uL (ref 0.0–0.5)
Eosinophils Relative: 1 %
HCT: 40 % (ref 39.0–52.0)
Hemoglobin: 13.8 g/dL (ref 13.0–17.0)
Immature Granulocytes: 0 %
Lymphocytes Relative: 36 %
Lymphs Abs: 1.8 10*3/uL (ref 0.7–4.0)
MCH: 33.8 pg (ref 26.0–34.0)
MCHC: 34.5 g/dL (ref 30.0–36.0)
MCV: 98 fL (ref 80.0–100.0)
Monocytes Absolute: 0.3 10*3/uL (ref 0.1–1.0)
Monocytes Relative: 6 %
Neutro Abs: 2.8 10*3/uL (ref 1.7–7.7)
Neutrophils Relative %: 55 %
Platelets: 314 10*3/uL (ref 150–400)
RBC: 4.08 MIL/uL — ABNORMAL LOW (ref 4.22–5.81)
RDW: 13.8 % (ref 11.5–15.5)
WBC: 5 10*3/uL (ref 4.0–10.5)
nRBC: 0 % (ref 0.0–0.2)

## 2020-06-28 LAB — COMPREHENSIVE METABOLIC PANEL
ALT: 85 U/L — ABNORMAL HIGH (ref 0–44)
AST: 82 U/L — ABNORMAL HIGH (ref 15–41)
Albumin: 4.2 g/dL (ref 3.5–5.0)
Alkaline Phosphatase: 58 U/L (ref 38–126)
Anion gap: 18 — ABNORMAL HIGH (ref 5–15)
BUN: 14 mg/dL (ref 6–20)
CO2: 19 mmol/L — ABNORMAL LOW (ref 22–32)
Calcium: 8.9 mg/dL (ref 8.9–10.3)
Chloride: 102 mmol/L (ref 98–111)
Creatinine, Ser: 0.73 mg/dL (ref 0.61–1.24)
GFR calc Af Amer: >60 mL/min (ref >60)
GFR calc non Af Amer: >60 mL/min (ref >60)
Glucose, Bld: 290 mg/dL — ABNORMAL HIGH (ref 70–99)
Potassium: 4.5 mmol/L (ref 3.5–5.1)
Sodium: 139 mmol/L (ref 135–145)
Total Bilirubin: 0.4 mg/dL (ref 0.3–1.2)
Total Protein: 7.4 g/dL (ref 6.5–8.1)

## 2020-06-28 LAB — CBG MONITORING, ED: Glucose-Capillary: 85 mg/dL (ref 70–99)

## 2020-06-28 LAB — SALICYLATE LEVEL: Salicylate Lvl: <7 mg/dL — ABNORMAL LOW (ref 7.0–30.0)

## 2020-06-28 LAB — ACETAMINOPHEN LEVEL: Acetaminophen (Tylenol), Serum: <10 ug/mL — ABNORMAL LOW (ref 10–30)

## 2020-06-28 LAB — RAPID URINE DRUG SCREEN, HOSP PERFORMED
Amphetamines: NOT DETECTED
Barbiturates: NOT DETECTED
Benzodiazepines: NOT DETECTED
Cocaine: NOT DETECTED
Opiates: NOT DETECTED
Tetrahydrocannabinol: NOT DETECTED

## 2020-06-28 LAB — SARS CORONAVIRUS 2 BY RT PCR (HOSPITAL ORDER, PERFORMED IN ~~LOC~~ HOSPITAL LAB): SARS Coronavirus 2: NEGATIVE

## 2020-06-28 LAB — ETHANOL: Alcohol, Ethyl (B): 354 mg/dL (ref <10)

## 2020-06-28 IMAGING — CT CT HEAD W/O CM
3 of 4 series · 14 of 47 positions shown, 16 images · non-contrast
Comparison: Prior head CT [DATE] and earlier

CLINICAL DATA: Head trauma, abnormal mental status.

EXAM:
CT HEAD WITHOUT CONTRAST
TECHNIQUE: Contiguous axial images were obtained from the base of the skull
through the vertex without intravenous contrast.

[Series 2: head wo · axial · 0.47mm/px · z∈[+1521,+1661]mm · 8 of 34 slices shown, 10 images]
[im 3/34  brain]
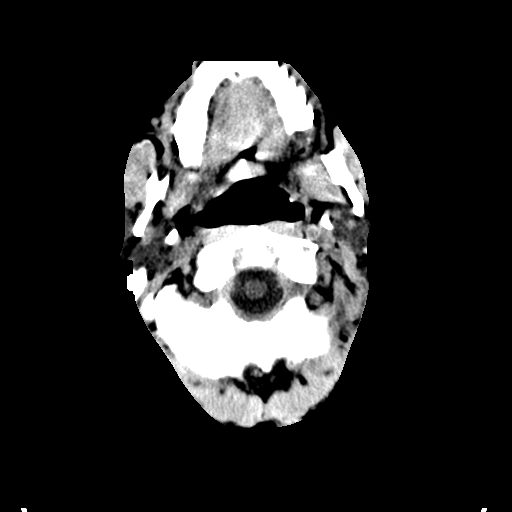
[im 3/34  bone]
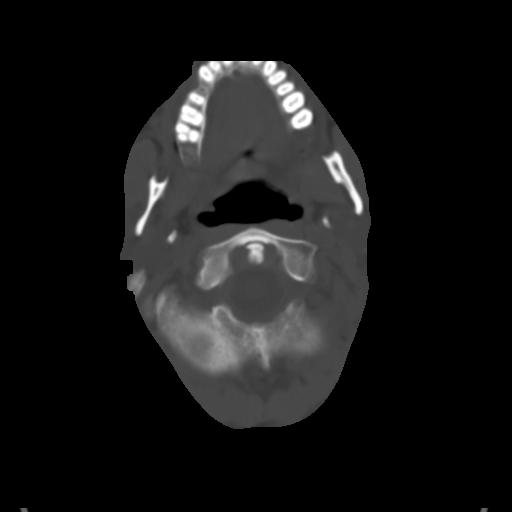
[im 8/34  brain]
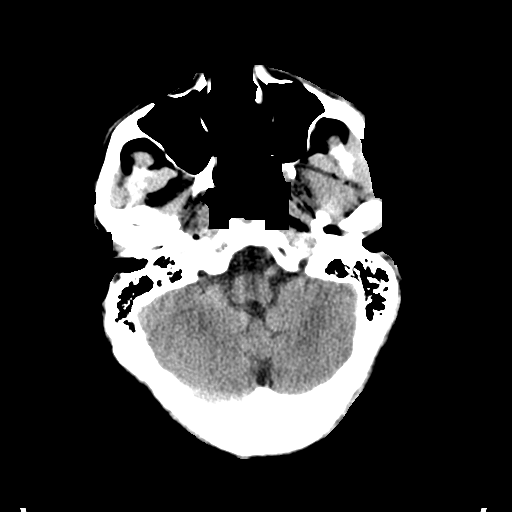
[im 12/34  brain]
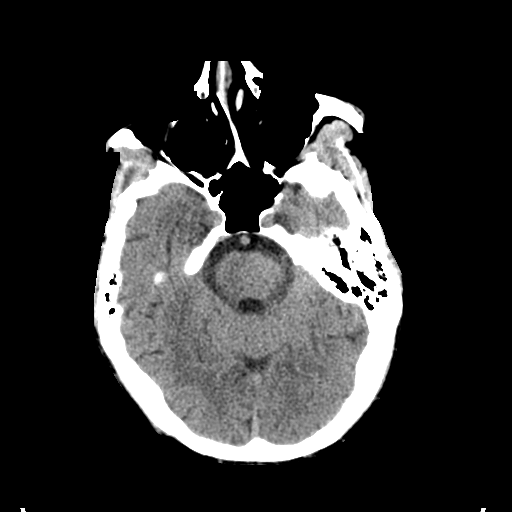
[im 15/34  brain]
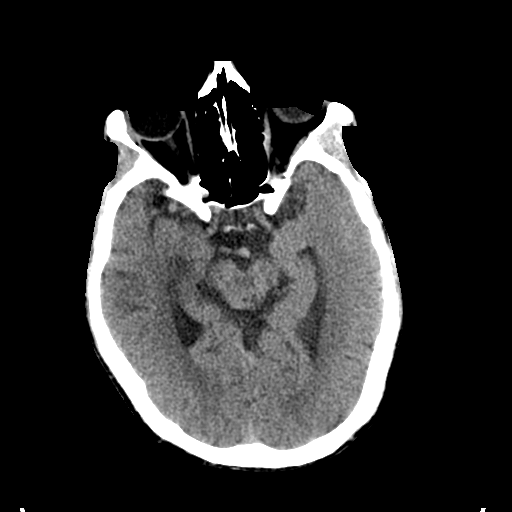
[im 19/34  brain]
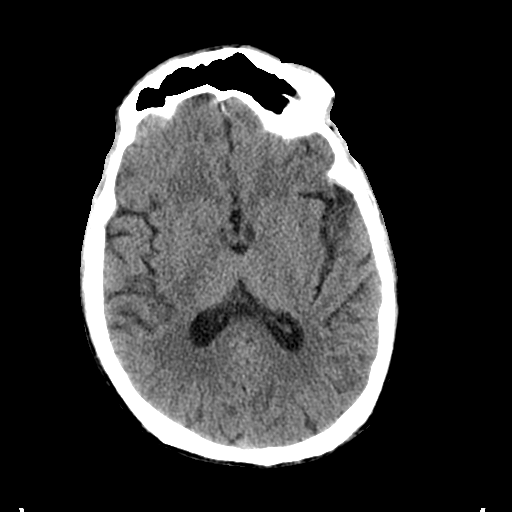
[im 19/34  bone]
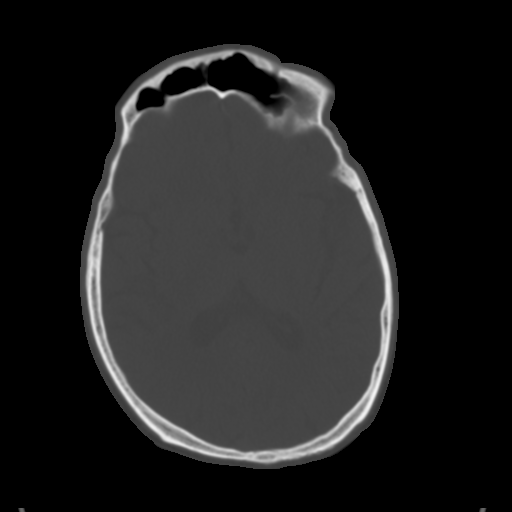
[im 22/34  brain]
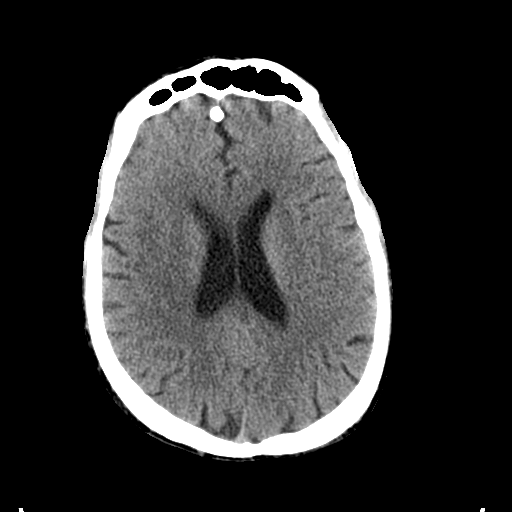
[im 26/34  brain]
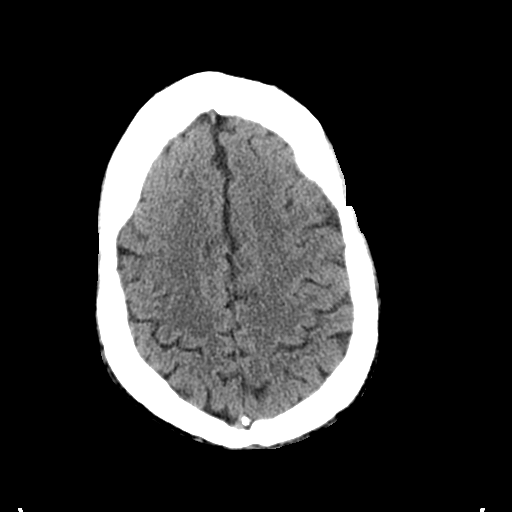
[im 31/34  brain]
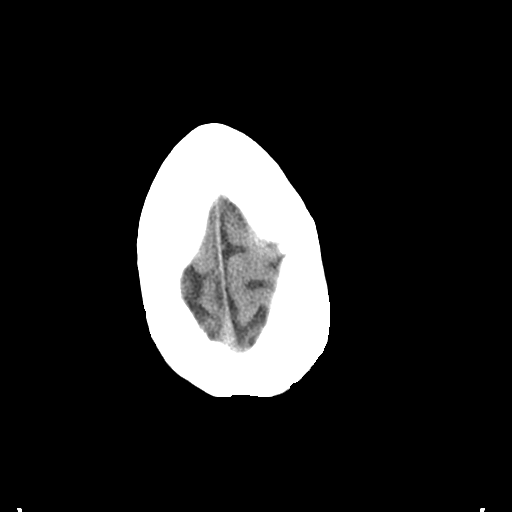

[Series 5: coronal soft tissue · coronal · 0.35mm/px · 3 of 72 slices shown]
[im 24/72  brain]
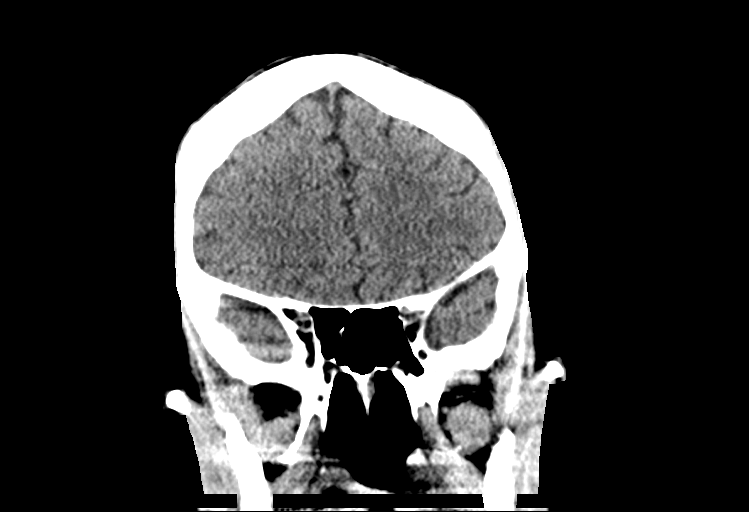
[im 32/72  brain]
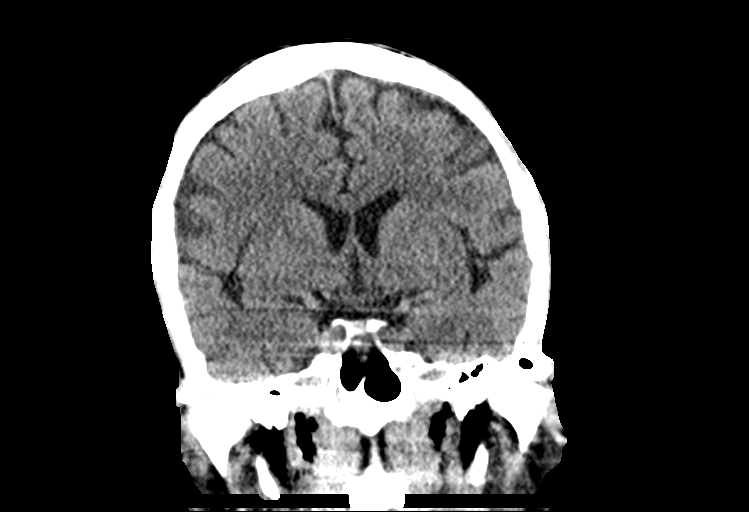
[im 40/72  brain]
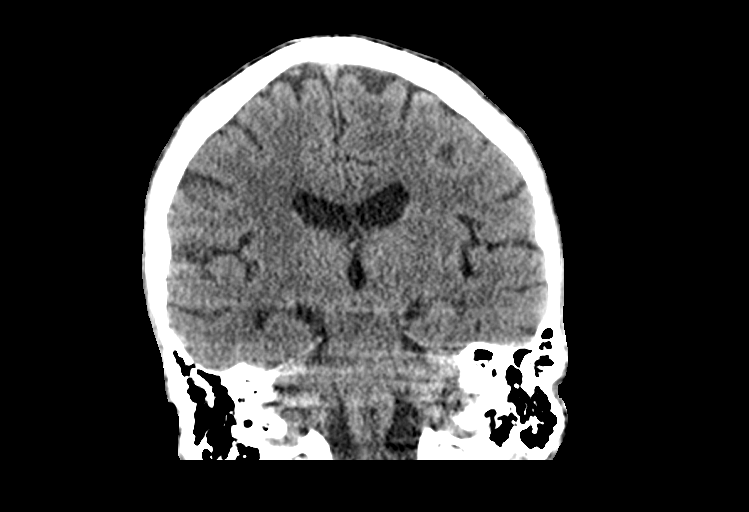

[Series 6: sagittal soft tissue · sagittal · 0.35mm/px · 3 of 54 slices shown]
[im 18/54  brain]
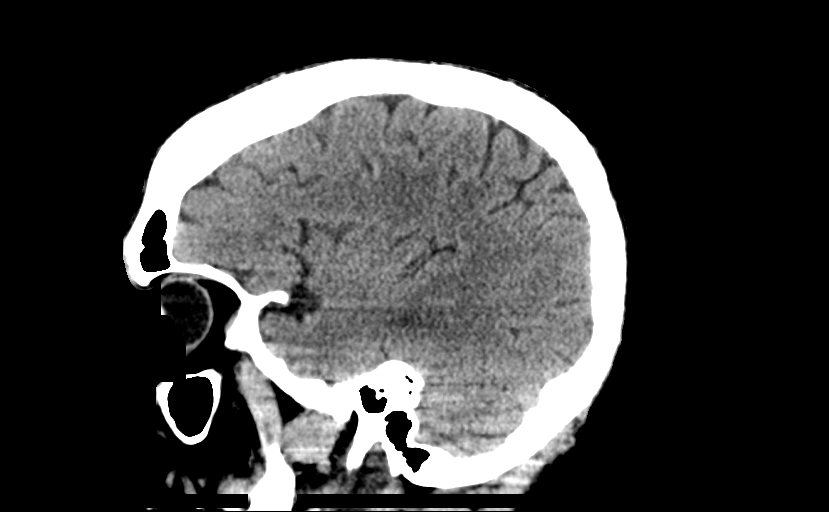
[im 27/54  brain]
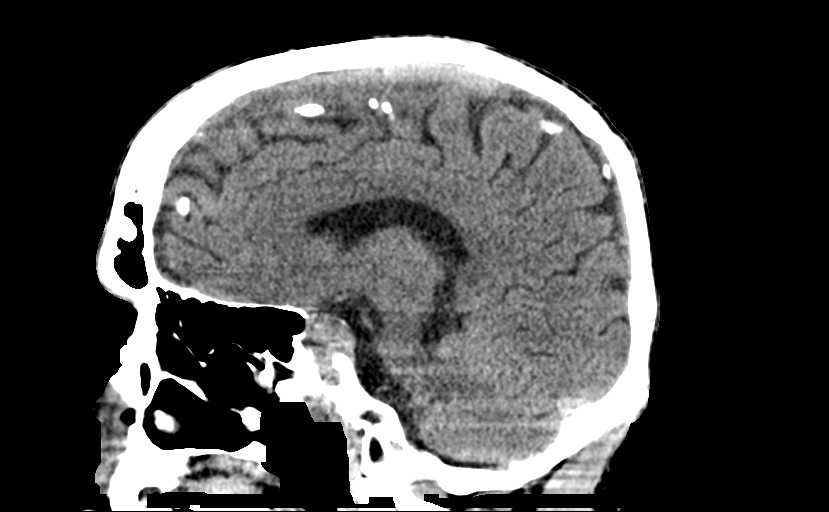
[im 36/54  brain]
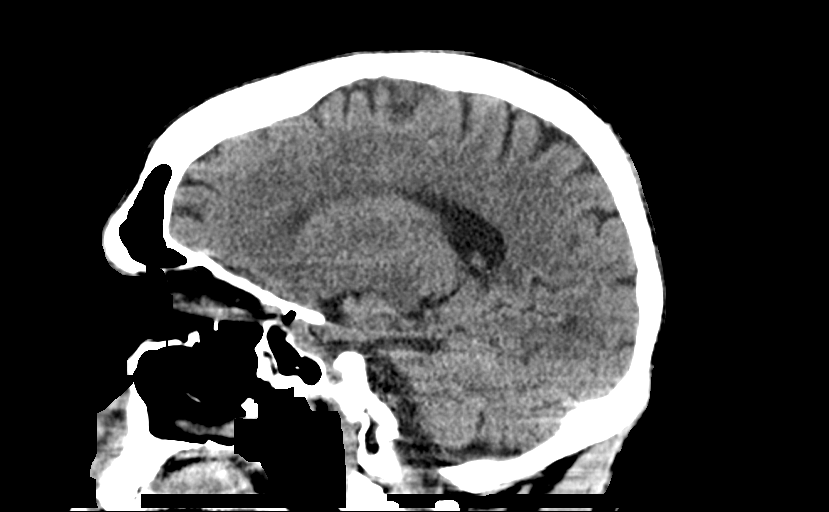

[14 of 47 positions shown; findings below may reference images not displayed]

FINDINGS: Brain:

Mild generalized parenchymal atrophy is advanced for age.

There is no acute intracranial hemorrhage.

No demarcated cortical infarct.

No extra-axial fluid collection.

No evidence of intracranial mass.

No midline shift.

Vascular: No hyperdense vessel.

Skull: Normal. Negative for fracture or focal lesion.

Sinuses/Orbits: Visualized orbits show no acute finding. No
significant paranasal sinus disease or mastoid effusion at the
imaged levels.
IMPRESSION: No evidence of acute intracranial abnormality.

Mild generalized parenchymal atrophy of the brain which is advanced
for age.

## 2020-06-28 IMAGING — CR DG CHEST 2V
2 series · 2 of 2 positions shown · non-contrast
Comparison: Single-view of the chest [DATE].

CLINICAL DATA: Altered mental status.  Hyperglycemia.

EXAM:
CHEST - 2 VIEW

[w chest lat]
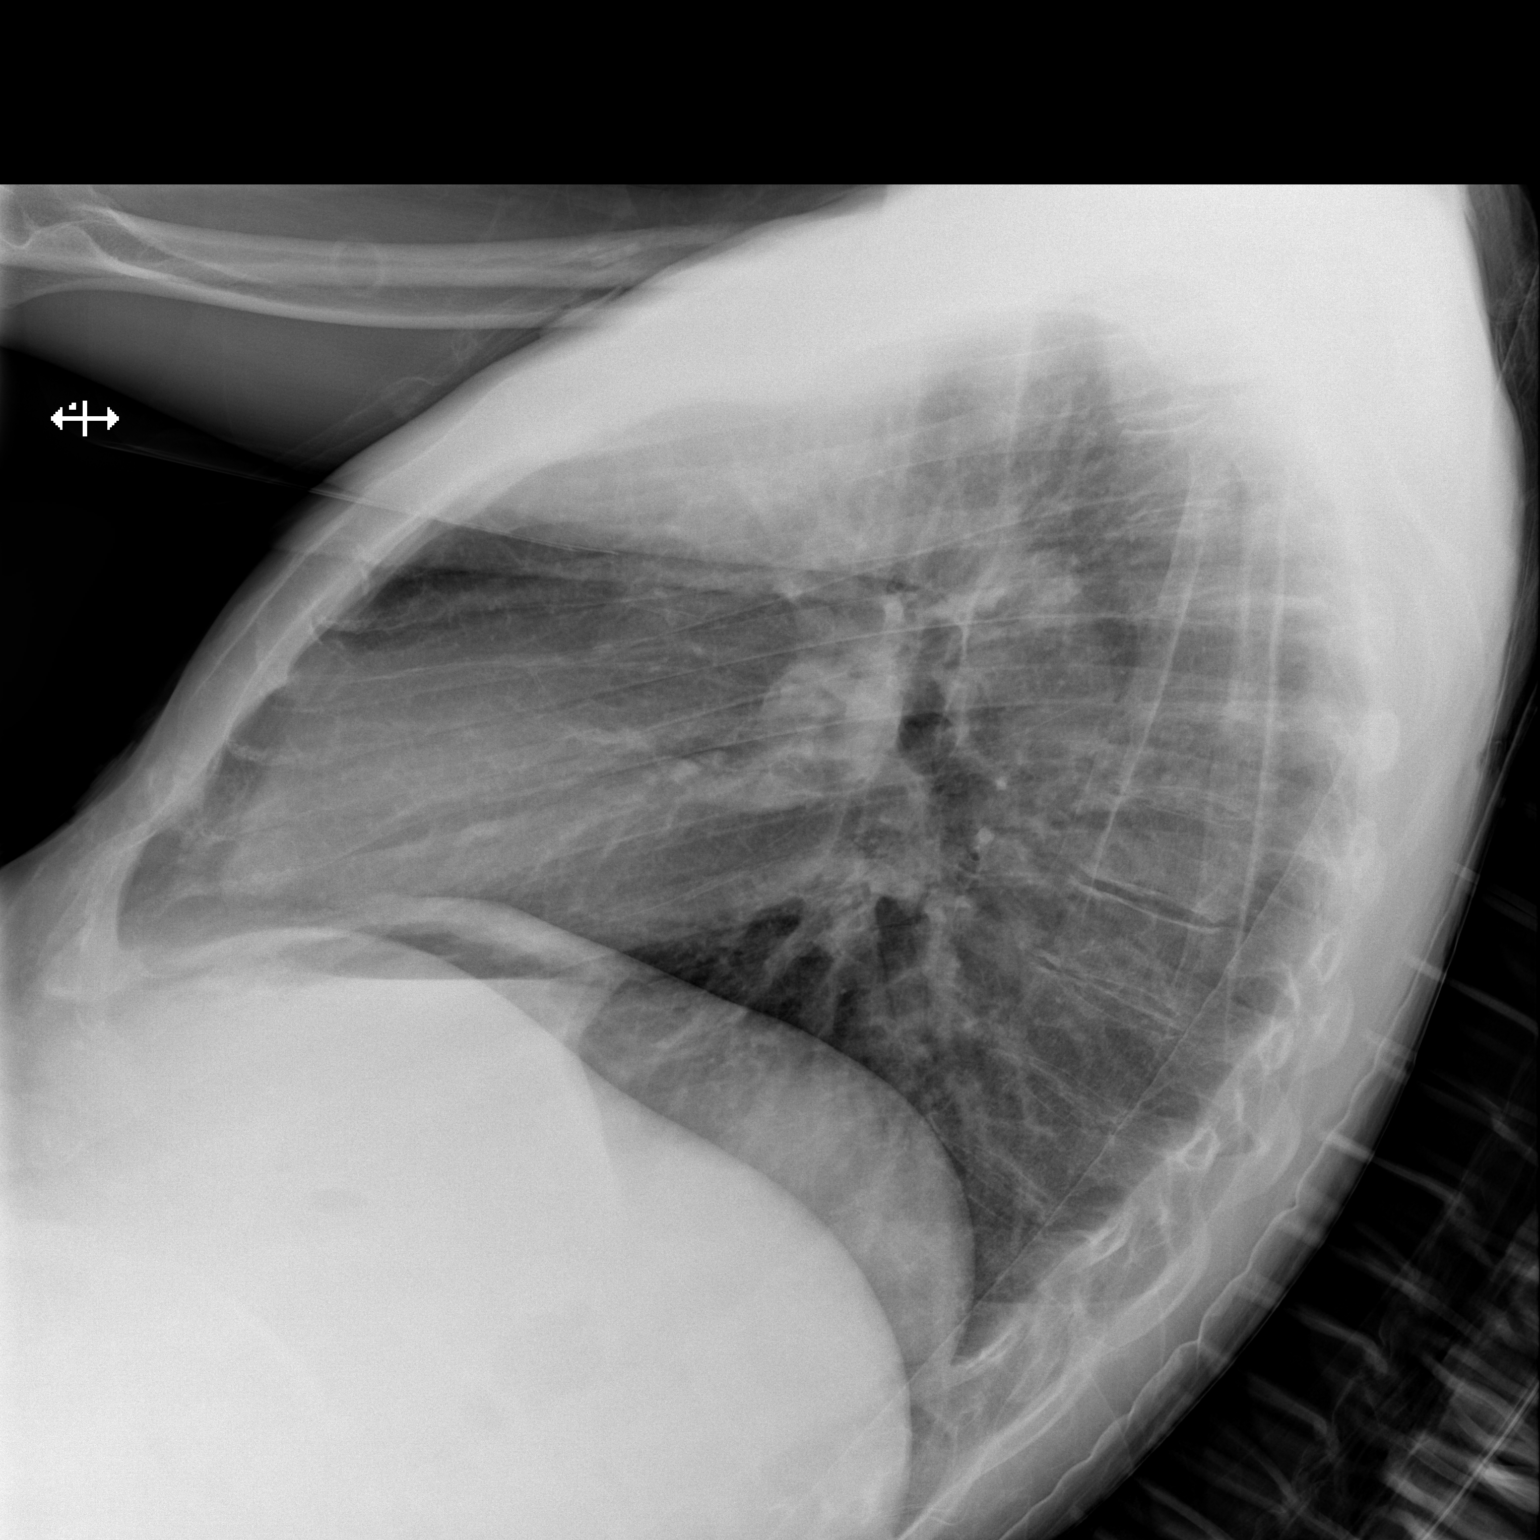

[x chest ap]
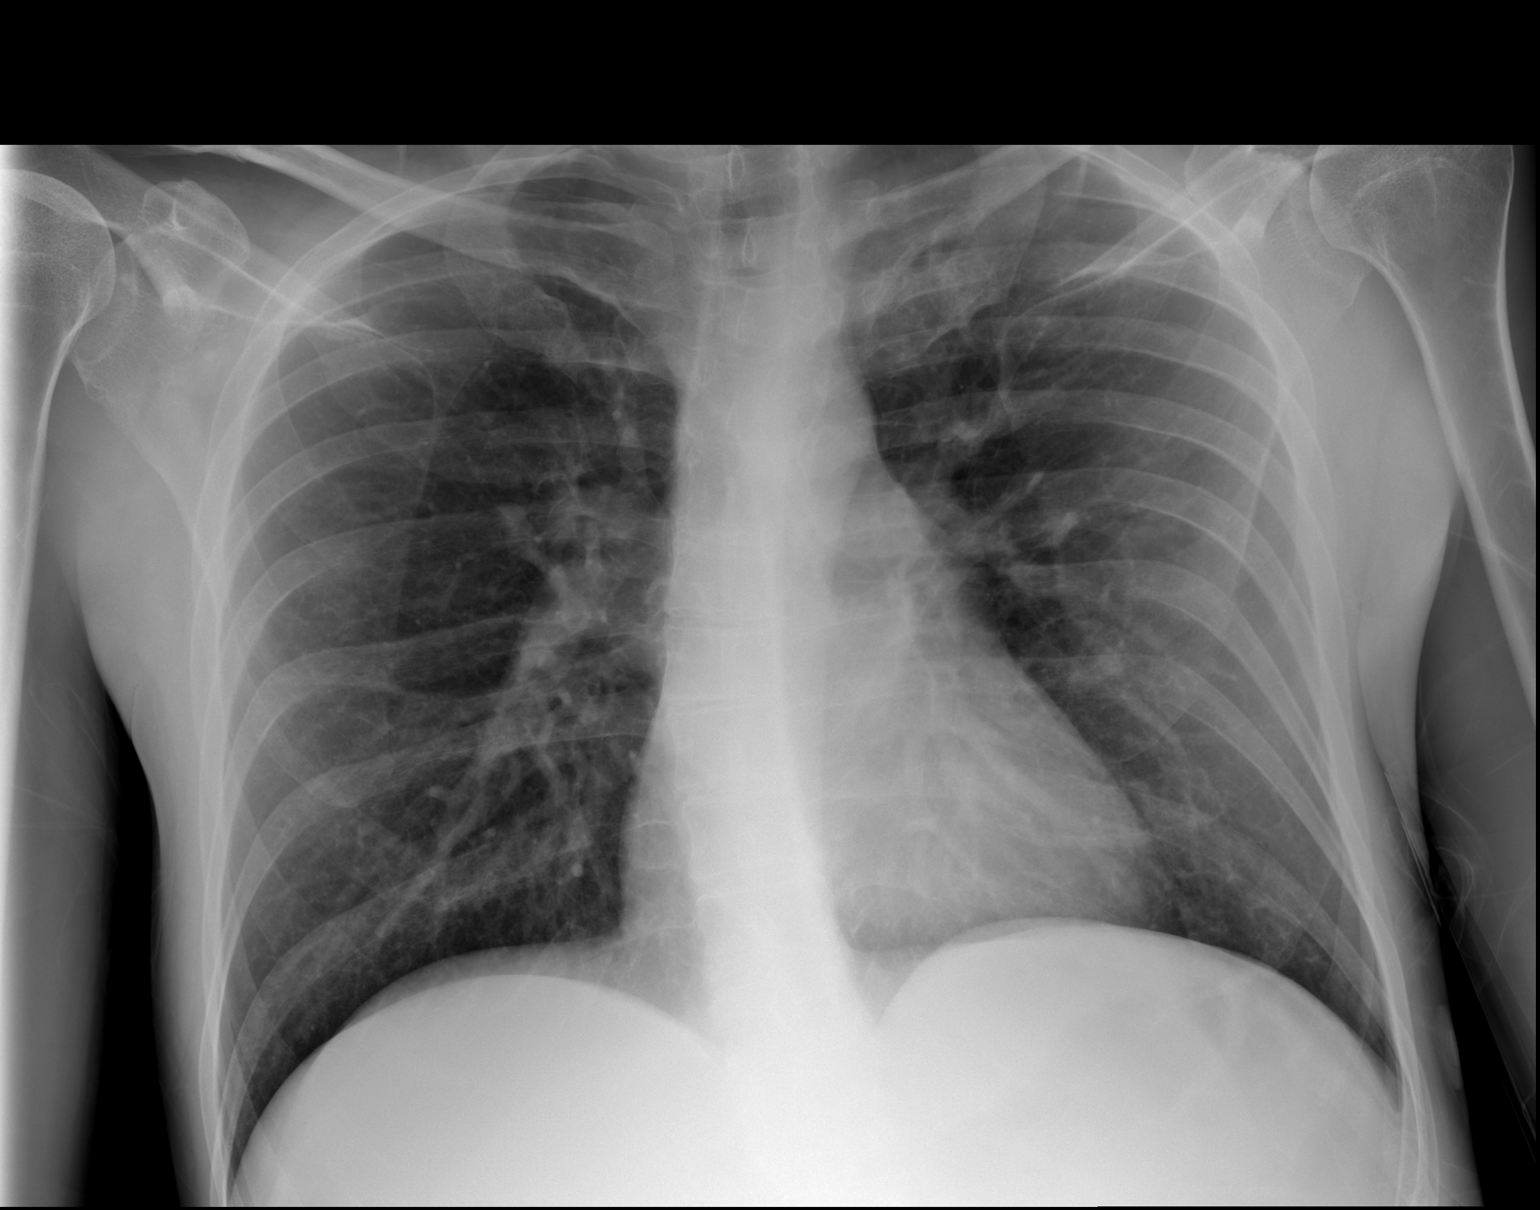

[2 of 2 positions shown; findings below may reference images not displayed]

FINDINGS: Lungs clear. Heart size normal. No pneumothorax or pleural fluid. No
acute or focal bony abnormality.
IMPRESSION: Negative chest.

## 2020-06-28 MED ORDER — LORAZEPAM 2 MG/ML IJ SOLN: 0.0000 mg | Freq: Two times a day (BID) | INTRAMUSCULAR | Status: DC

## 2020-06-28 MED ORDER — ONDANSETRON 8 MG PO TBDP
8.0000 mg | ORAL_TABLET | Freq: Once | ORAL | Status: AC
  Administered 2020-06-28: 8 mg via ORAL
  Filled 2020-06-28: qty 1

## 2020-06-28 MED ORDER — ADULT MULTIVITAMIN W/MINERALS CH
1.0000 | ORAL_TABLET | Freq: Every day | ORAL | Status: DC
  Administered 2020-06-28 – 2020-06-29 (×2): 1 via ORAL
  Filled 2020-06-28 (×2): qty 1

## 2020-06-28 MED ORDER — THIAMINE HCL 100 MG/ML IJ SOLN
100.0000 mg | Freq: Every day | INTRAMUSCULAR | Status: DC
  Administered 2020-06-28: 100 mg via INTRAVENOUS
  Filled 2020-06-28: qty 2

## 2020-06-28 MED ORDER — LORAZEPAM 2 MG/ML IJ SOLN
0.0000 mg | Freq: Four times a day (QID) | INTRAMUSCULAR | Status: DC
  Administered 2020-06-28: 1 mg via INTRAVENOUS
  Filled 2020-06-28: qty 1

## 2020-06-28 MED ORDER — SODIUM CHLORIDE 0.9 % IV SOLN: INTRAVENOUS | Status: DC

## 2020-06-28 MED ORDER — ALUM & MAG HYDROXIDE-SIMETH 200-200-20 MG/5ML PO SUSP
30.0000 mL | Freq: Once | ORAL | Status: AC
  Administered 2020-06-28: 30 mL via ORAL
  Filled 2020-06-28: qty 30

## 2020-06-28 MED ORDER — ELVITEG-COBIC-EMTRICIT-TENOFAF 150-150-200-10 MG PO TABS
1.0000 | ORAL_TABLET | Freq: Every day | ORAL | Status: DC
  Administered 2020-06-29: 1 via ORAL
  Filled 2020-06-28: qty 1

## 2020-06-28 MED ORDER — LORAZEPAM 1 MG PO TABS: 0.0000 mg | ORAL_TABLET | Freq: Two times a day (BID) | ORAL | Status: DC

## 2020-06-28 MED ORDER — THIAMINE HCL 100 MG PO TABS
100.0000 mg | ORAL_TABLET | Freq: Every day | ORAL | Status: DC
  Administered 2020-06-29: 100 mg via ORAL
  Filled 2020-06-28: qty 1

## 2020-06-28 MED ORDER — LORAZEPAM 1 MG PO TABS
0.0000 mg | ORAL_TABLET | Freq: Four times a day (QID) | ORAL | Status: DC
  Administered 2020-06-29 (×2): 1 mg via ORAL
  Filled 2020-06-28 (×2): qty 1

## 2020-06-28 MED ORDER — SODIUM CHLORIDE 0.9 % IV BOLUS
2000.0000 mL | Freq: Once | INTRAVENOUS | Status: AC
  Administered 2020-06-28: 2000 mL via INTRAVENOUS
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
## 2020-06-28 NOTE — ED Notes (Signed)
Patients belongings are in 2 bags. Location in cabinet for Hall C at nurses station 9-12 1 pair of jeans 1 shirt 1 pair of boots 1 belt 1 hat

## 2020-06-28 NOTE — ED Notes (Signed)
Labeled urine specimen and culture sent to lab. ENMiles 

## 2020-06-28 NOTE — ED Provider Notes (Signed)
[facility]-EMERGENCY DEPT Provider Note   CSN: [id number] Arrival date & time: 06/28/20  1724     History Chief Complaint  Patient presents with  . Alcohol Intoxication    Gary [name] is a 34 y.o. male.  34 year old male who presents with alcohol intoxication.  Patient states that he was drinking in an attempt to commit suicide.  States he drinks daily and denies any other ingestions.  He also has vague homicidal ideations well 2.  Patient found by EMS drinking alcohol and stating that he went to harm himself.  He denies any abdominal discomfort.  Has also vague complaints of being short of breath however he denies any cough.        Past Medical History:  Diagnosis Date  . Alcohol abuse   . HIV (human immunodeficiency virus infection) (HCC)   . Subdural hematoma [facility])     Patient Active Problem List   Diagnosis Date Noted  . Alcohol abuse   . Alcohol poisoning   . Suicidal ideation 06/14/2020  . Pancreatitis 06/13/2020  . 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  . Malnutrition of moderate degree 05/07/2020  . Gastrointestinal hemorrhage   . Alcoholic hepatitis without ascites   . Melena 05/05/2020  . Fever 05/05/2020  . Alcoholic ketoacidosis 05/05/2020  . Alcohol withdrawal (HCC) 04/17/2020  . Thrombocytopenia (HCC) 07/25/2019  . Transaminitis 07/25/2019  . Diarrhea 07/25/2019  . Traumatic subdural hematoma (HCC) 07/02/2019  . Alcohol abuse with intoxication (HCC) 07/02/2019  . Alcohol abuse with alcohol-induced mood disorder (HCC) 05/13/2019  . Alcohol dependence (HCC) 04/16/2019  . 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 HIV disease (HCC) 06/16/2018  . Polysubstance abuse (HCC) 06/16/2018  . Cigarette smoker 06/16/2018    Past Surgical History:  Procedure Laterality Date  . INCISION AND DRAINAGE PERIRECTAL ABSCESS N/A 09/24/2016   Procedure: IRRIGATION AND DEBRIDEMENT  PERIRECTAL ABSCESS;  Surgeon: Ricarda Frame, MD;  Location: [facility] ORS;  Service: General;  Laterality: N/A;  . none         Family History  Problem Relation Age of Onset  . Alcohol abuse Mother   . Alcohol abuse Father     Social History   Tobacco Use  . Smoking status: Former Smoker    Packs/day: 0.50    Types: Cigarettes  . Smokeless tobacco: Never Used  . Tobacco comment: unable to smoke while incarcerated 6+ months 02/13/20  Vaping Use  . Vaping Use: Never used  Substance Use Topics  . Alcohol use: Yes    Alcohol/week: 3.0 standard drinks    Types: 3 Cans of beer per week    Comment: 24 oz  . Drug use: Not Currently    Types: Marijuana, Methamphetamines    Comment: last used 04/10/2019    Home Medications Prior to Admission medications   Medication Sig Start Date End Date Taking? Authorizing Provider  elvitegravir-cobicistat-emtricitabine-tenofovir (GENVOYA) 150-150-200-10 MG TABS tablet Take 1 tablet by mouth daily with breakfast. 05/18/20   Linwood Dibbles, MD  FLUoxetine (PROZAC) 20 MG capsule Take 1 capsule (20 mg total) by mouth daily. 06/19/20 07/19/20  Kendell Bane, MD  Multiple Vitamin (MULTIVITAMIN WITH MINERALS) TABS tablet Take 1 tablet by mouth daily. 06/19/20 07/19/20  Shahmehdi, Gemma Payor, MD  nicotine (NICODERM CQ - DOSED IN MG/24 HOURS) 21 mg/24hr patch Place 1 patch (21 mg total) onto the skin daily. Patient not taking: Reported on 06/07/2020 05/09/20   Burnadette Pop, MD  pantoprazole (  PROTONIX) 40 MG tablet Take 1 tablet (40 mg total) by mouth daily. Patient not taking: Reported on 06/07/2020 05/09/20   Burnadette Pop, MD  sucralfate (CARAFATE) 1 GM/10ML suspension Take 10 mLs (1 g total) by mouth 4 (four) times daily -  with meals and at bedtime for 14 days. Patient not taking: Reported on 06/07/2020 05/09/20 06/07/20  Burnadette Pop, MD  famotidine (PEPCID) 20 MG tablet Take 1 tablet (20 mg total) by mouth 2 (two) times daily. Patient not taking: Reported on  06/01/2019 05/14/19 06/02/19  Benjiman Core, MD    Allergies    Tegretol [carbamazepine] and Caffeine  Review of Systems   Review of Systems  Unable to perform ROS: Acuity of condition    Physical Exam Updated Vital Signs BP 117/88 (BP Location: Right Arm)   Pulse (!) 102   Temp 98.7 F (37.1 C) (Oral)   Resp 18   SpO2 93%   Physical Exam Vitals and nursing note reviewed.  Constitutional:      General: He is not in acute distress.    Appearance: Normal appearance. He is well-developed. He is not toxic-appearing.  HENT:     Head: Normocephalic and atraumatic.  Eyes:     General: Lids are normal.     Conjunctiva/sclera: Conjunctivae normal.     Pupils: Pupils are equal, round, and reactive to light.  Neck:     Thyroid: No thyroid mass.     Trachea: No tracheal deviation.  Cardiovascular:     Rate and Rhythm: Normal rate and regular rhythm.     Heart sounds: Normal heart sounds. No murmur heard.  No gallop.   Pulmonary:     Effort: Pulmonary effort is normal. No respiratory distress.     Breath sounds: Normal breath sounds. No stridor. No decreased breath sounds, wheezing, rhonchi or rales.  Abdominal:     General: Bowel sounds are normal. There is no distension.     Palpations: Abdomen is soft.     Tenderness: There is no abdominal tenderness. There is no rebound.  Musculoskeletal:        General: No tenderness. Normal range of motion.     Cervical back: Normal range of motion and neck supple.  Skin:    General: Skin is warm and dry.     Findings: No abrasion or rash.  Neurological:     Mental Status: He is alert and oriented to person, place, and time.     GCS: GCS eye subscore is 4. GCS verbal subscore is 5. GCS motor subscore is 6.     Cranial Nerves: No cranial nerve deficit.     Sensory: No sensory deficit.  Psychiatric:        Attention and Perception: He is inattentive.        Mood and Affect: Affect is flat.        Speech: Speech is delayed.         Behavior: Behavior is withdrawn.        Thought Content: Thought content includes suicidal ideation. Thought content includes suicidal plan.     ED Results / Procedures / Treatments   Labs (all labs ordered are listed, but only abnormal results are displayed) Labs Reviewed  CBC WITH DIFFERENTIAL/PLATELET - Abnormal; Notable for the following components:      Result Value   RBC 4.08 (*)    All other components within normal limits  COMPREHENSIVE METABOLIC PANEL - Abnormal; Notable for the following components:   CO2  19 (*)    Glucose, Bld 290 (*)    AST 82 (*)    ALT 85 (*)    Anion gap 18 (*)    All other components within normal limits  ETHANOL - Abnormal; Notable for the following components:   Alcohol, Ethyl (B) 354 (*)    All other components within normal limits  SALICYLATE LEVEL - Abnormal; Notable for the following components:   Salicylate Lvl <7.0 (*)    All other components within normal limits  ACETAMINOPHEN LEVEL - Abnormal; Notable for the following components:   Acetaminophen (Tylenol), Serum <10 (*)    All other components within normal limits  SARS CORONAVIRUS 2 BY RT PCR (HOSPITAL ORDER, PERFORMED IN [facility])  RAPID URINE DRUG SCREEN, HOSP PERFORMED    EKG None  Radiology CT Head Wo Contrast  Result Date: 06/28/2020 CLINICAL DATA:  Head trauma, abnormal mental status. EXAM: CT HEAD WITHOUT CONTRAST TECHNIQUE: Contiguous axial images were obtained from the base of the skull through the vertex without intravenous contrast. COMPARISON:  Prior head CT 06/23/2020 and earlier FINDINGS: Brain: Mild generalized parenchymal atrophy is advanced for age. There is no acute intracranial hemorrhage. No demarcated cortical infarct. No extra-axial fluid collection. No evidence of intracranial mass. No midline shift. Vascular: No hyperdense vessel. Skull: Normal. Negative for fracture or focal lesion. Sinuses/Orbits: Visualized orbits show no acute finding. No  significant paranasal sinus disease or mastoid effusion at the imaged levels. IMPRESSION: No evidence of acute intracranial abnormality. Mild generalized parenchymal atrophy of the brain which is advanced for age. Electronically Signed   By: Jackey Loge DO   On: 06/28/2020 18:43    Procedures Procedures (including critical care time)  Medications Ordered in ED Medications  sodium chloride 0.9 % bolus 2,000 mL (has no administration in time range)  0.9 %  sodium chloride infusion (has no administration in time range)    ED Course  I have reviewed the triage vital signs and the nursing notes.  Pertinent labs & imaging results that were available during my care of the patient were reviewed by me and considered in my medical decision making (see chart for details).    MDM Rules/Calculators/A&P                          8:53 PM Patient monitored and now is able to have a conversation.  Endorses SI.  Will consult behavioral health for admission Final Clinical Impression(s) / ED Diagnoses Final diagnoses:  None    Rx / DC Orders ED Discharge Orders    None       Lorre Nick, MD 06/28/20 2054

## 2020-06-28 NOTE — ED Notes (Signed)
Date and time results received: 06/28/20  (use smartphrase ".now" to insert current time)  Test: ETOH Critical Value: 354  Name of Provider Notified: Freida Busman  Orders Received? Or Actions Taken?: See orders
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
## 2020-06-29 MED ORDER — LORAZEPAM 2 MG/ML IJ SOLN
1.0000 mg | Freq: Once | INTRAMUSCULAR | Status: AC
  Administered 2020-06-29: 1 mg via INTRAVENOUS
  Filled 2020-06-29: qty 1

## 2020-06-29 MED ORDER — ACETAMINOPHEN 325 MG PO TABS
650.0000 mg | ORAL_TABLET | Freq: Once | ORAL | Status: AC
  Administered 2020-06-29: 650 mg via ORAL
  Filled 2020-06-29: qty 2

## 2020-06-29 MED ORDER — ONDANSETRON HCL 4 MG/2ML IJ SOLN
4.0000 mg | Freq: Once | INTRAMUSCULAR | Status: AC
  Administered 2020-06-29: 4 mg via INTRAVENOUS
  Filled 2020-06-29: qty 2

## 2020-06-29 MED ORDER — CHLORDIAZEPOXIDE HCL 25 MG PO CAPS
75.0000 mg | ORAL_CAPSULE | Freq: Once | ORAL | Status: DC | PRN
  Filled 2020-06-29: qty 3

## 2020-06-29 MED ORDER — CHLORDIAZEPOXIDE HCL 25 MG PO CAPS: ORAL_CAPSULE | ORAL | 0 refills | Status: DC

## 2020-06-29 MED ORDER — GABAPENTIN 100 MG PO CAPS: 200.0000 mg | ORAL_CAPSULE | Freq: Two times a day (BID) | ORAL | Status: DC

## 2020-06-29 NOTE — ED Notes (Signed)
Assessment team at bedside. 

## 2020-06-29 NOTE — BH Assessment (Signed)
Comprehensive Clinical Assessment (CCA) Screening, Triage and Referral Note  06/29/2020 Gary [name] [id number] Patient presents voluntary with S/I on arrival and due to being impaired could not be assessed until this date.  Patient reported on arrival the reason for him presenting is due to him "trying to drink himself to death" per notes. Patient had a noted BAL of 354 on arrival. Patient was seen at 0900 hours this date and was observed to alert and oriented. Patient is denying S/I at the time of assessment. Patient denies any H/I or AVH. Patient per chart review has been seen for the same in the past with last assessment noted to be on 06/07/20 when he presented with similar symptoms. Patient reports being homeless and denies having a current OP provider. Patient reports the last living arrangement he had was two months ago with a partner in [place], [place]. Patient reports a history of ongoing depression and anxiety although is not receiving any services from ongoing symptoms. Patient reports daily alcohol use for the last year stating he consumes various amounts with last use prior to arrival when he reported drinking one fifth of liquor. Patient reports current withdrawals to include: tremors, nausea and profuse sweating. Patient is contacting for safety although is concerned about his current withdrawals. Patient denies any prior attempts or gestures at self harm. Patient denies access to firearms. Patient declines any OP resources at this time and states on discharge he "will try AA groups."   Patient is alert and oriented. Patient speaks with normal tone and volume. Patient's memory is intact and thoughts organized. Patient does not appear to be responding to internal stimuli. Per Arvilla Market NP patient has been cleared by psychiatry although will be monitored for withdrawals until medically cleared.    Visit Diagnosis: Substance induced mood disorder   Patient Reported Information How did you hear  about [place]? Self   Referral name: No data recorded  Referral phone number: No data recorded Whom do you see for routine medical problems? I don't have a doctor   Practice/Facility Name: No data recorded  Practice/Facility Phone Number: No data recorded  Name of Contact: No data recorded  Contact Number: No data recorded  Contact Fax Number: No data recorded  Prescriber Name: No data recorded  Prescriber Address (if known): No data recorded What Is the Reason for Your Visit/Call Today? Ongoing alcohol issues and S/I  How Long Has This Been Causing You Problems? > than 6 months  Have You Recently Been in Any Inpatient Treatment (Hospital/Detox/Crisis Center/28-Day Program)? No   Name/Location of Program/Hospital:No data recorded  How Long Were You There? No data recorded  When Were You Discharged? No data recorded Have You Ever Received Services From [facility] Before? No   Who Do You See at [facility]? No data recorded Have You Recently Had Any Thoughts About Hurting Yourself? No   Are You Planning to Commit Suicide/Harm Yourself At This time?  No  Have you Recently Had Thoughts About Hurting Someone [name]? No   Explanation: No data recorded Have You Used Any Alcohol or Drugs in the Past 24 Hours? Yes   How Long Ago Did You Use Drugs or Alcohol?  0300   What Did You Use and How Much? One fifth of liquor  What Do You Feel Would Help You the Most Today? Therapy  Do You Currently Have a Therapist/Psychiatrist? No   Name of Therapist/Psychiatrist: No data recorded  Have You Been Recently Discharged From Any Office  Practice or Programs? No   Explanation of Discharge From Practice/Program:  No data recorded    CCA Screening Triage Referral Assessment Type of Contact: Face-to-Face   Is this Initial or Reassessment? No data recorded  Date Telepsych consult ordered in [facility]:  No data recorded  Time Telepsych consult ordered in [facility]:  No data recorded Patient Reported  Information Reviewed? Yes   Patient Left Without Being Seen? No data recorded  Reason for Not Completing Assessment: No data recorded Collateral Involvement: None noted  Does Patient Have a Court Appointed Legal Guardian? No data recorded  Name and Contact of Legal Guardian:  No data recorded If Minor and Not Living with Parent(s), Who has Custody? No data recorded Is CPS involved or ever been involved? Never  Is APS involved or ever been involved? Never  Patient Determined To Be At Risk for Harm To Self or Others Based on Review of Patient Reported Information or Presenting Complaint? No   Method: No data recorded  Availability of Means: No data recorded  Intent: No data recorded  Notification Required: No data recorded  Additional Information for Danger to Others Potential:  No data recorded  Additional Comments for Danger to Others Potential:  No data recorded  Are There Guns or Other Weapons in Your Home?  No data recorded   Types of Guns/Weapons: No data recorded   Are These Weapons Safely Secured?                              No data recorded   Who Could Verify You Are Able To Have These Secured:    No data recorded Do You Have any Outstanding Charges, Pending Court Dates, Parole/Probation? No data recorded Contacted To Inform of Risk of Harm To Self or Others: Other: Comment (NA)  Location of Assessment: [facility]  Does Patient Present under Involuntary Commitment? No   IVC Papers Initial File Date: No data recorded  [place] of Residence: [place]  Patient Currently Receiving the Following Services: Not Receiving Services   Determination of Need: No data recorded  Options For Referral: Outpatient Therapy   Alfredia Ferguson, LCAS

## 2020-06-29 NOTE — ED Provider Notes (Signed)
Patient reassessed. States is feeling much improved. Denies any thoughts of harm to self or others. Acknowledges issues with etoh abuse, and is willing to follow up as outpatient - will provide resources.   Pt has eaten/drank well. No nausea or vomiting. Felt mildly shaky, no gross tremulous noted. Given ativan 1 mg iv.  Pt denies hx seizures or dts.   Pt has normal mood and affect, is cooperative and conversant. Denies any thoughts of harm to self or others.   Vital signs normal, received ivf/meds in ED, and is tolerating po.   Pt currently appears stable for d/c. Rec pcp f/u.   Return precautions provided.      Cathren Laine, MD 06/29/20 [phone]

## 2020-06-29 NOTE — Discharge Instructions (Addendum)
It was our pleasure to provide your ER care today - we hope that you feel better.  Rest. Drink plenty of fluids.   No driving for the next 10 hours, or anytime when drinking alcohol.  If withdrawal symptoms, you may take librium as prescribed, as needed, for symptom relief - no driving when taking.   Follow up with primary care doctor in 1-2 weeks.   For mental health issues and/or crisis, you may go directly to the Behavioral Health Urgent Care, it is open 24/7.   Return to ER if worse, new symptoms, fevers, trouble breathing, or other concern.

## 2020-06-29 NOTE — ED Notes (Signed)
Pt. Requested assistance to bathroom, pt. Advised self removal of condom catheter. Pt. Assisted out of bed to and from bathroom without any complaints.

## 2020-06-29 NOTE — ED Notes (Signed)
Pt. Documented in error see above note in chart. 

## 2020-07-01 ENCOUNTER — Other Ambulatory Visit: Payer: Self-pay

## 2020-07-01 ENCOUNTER — Encounter (HOSPITAL_COMMUNITY): Payer: Self-pay

## 2020-07-01 ENCOUNTER — Emergency Department (HOSPITAL_COMMUNITY)
Admission: EM | Admit: 2020-07-01 | Discharge: 2020-07-02 | Disposition: A | Discharge status: Home / Self Care | Payer: Self-pay | Attending: Emergency Medicine | Admitting: Emergency Medicine

## 2020-07-01 DIAGNOSIS — Z5321 Procedure and treatment not carried out due to patient leaving prior to being seen by health care provider: Secondary | ICD-10-CM | POA: Insufficient documentation

## 2020-07-01 NOTE — ED Triage Notes (Signed)
Pt reports falling down but does not know when it was. Says his right arm hurts but will not say where. EMS reports pt drinking pta.

## 2020-07-03 ENCOUNTER — Inpatient Hospital Stay (HOSPITAL_COMMUNITY)
Admission: EM | Admit: 2020-07-03 | Discharge: 2020-07-05 | DRG: 894 | Discharge status: Against Medical Advice | Payer: Self-pay | Attending: Family Medicine | Admitting: Family Medicine

## 2020-07-03 ENCOUNTER — Encounter (HOSPITAL_COMMUNITY): Payer: Self-pay | Admitting: Emergency Medicine

## 2020-07-03 ENCOUNTER — Other Ambulatory Visit: Payer: Self-pay

## 2020-07-03 DIAGNOSIS — Z5329 Procedure and treatment not carried out because of patient's decision for other reasons: Secondary | ICD-10-CM | POA: Diagnosis not present

## 2020-07-03 DIAGNOSIS — F10220 Alcohol dependence with intoxication, uncomplicated: Secondary | ICD-10-CM | POA: Diagnosis present

## 2020-07-03 DIAGNOSIS — Y906 Blood alcohol level of 120-199 mg/100 ml: Secondary | ICD-10-CM | POA: Diagnosis present

## 2020-07-03 DIAGNOSIS — Z590 Homelessness: Secondary | ICD-10-CM

## 2020-07-03 DIAGNOSIS — R64 Cachexia: Secondary | ICD-10-CM | POA: Diagnosis present

## 2020-07-03 DIAGNOSIS — F1020 Alcohol dependence, uncomplicated: Secondary | ICD-10-CM | POA: Diagnosis present

## 2020-07-03 DIAGNOSIS — R45851 Suicidal ideations: Secondary | ICD-10-CM | POA: Diagnosis present

## 2020-07-03 DIAGNOSIS — Z91138 Patient's unintentional underdosing of medication regimen for other reason: Secondary | ICD-10-CM

## 2020-07-03 DIAGNOSIS — F10239 Alcohol dependence with withdrawal, unspecified: Principal | ICD-10-CM | POA: Diagnosis present

## 2020-07-03 DIAGNOSIS — F1094 Alcohol use, unspecified with alcohol-induced mood disorder: Secondary | ICD-10-CM | POA: Diagnosis present

## 2020-07-03 DIAGNOSIS — E162 Hypoglycemia, unspecified: Secondary | ICD-10-CM | POA: Diagnosis present

## 2020-07-03 DIAGNOSIS — Z915 Personal history of self-harm: Secondary | ICD-10-CM

## 2020-07-03 DIAGNOSIS — T375X6A Underdosing of antiviral drugs, initial encounter: Secondary | ICD-10-CM | POA: Diagnosis present

## 2020-07-03 DIAGNOSIS — Z811 Family history of alcohol abuse and dependence: Secondary | ICD-10-CM

## 2020-07-03 DIAGNOSIS — Z681 Body mass index (BMI) 19 or less, adult: Secondary | ICD-10-CM

## 2020-07-03 DIAGNOSIS — Z20822 Contact with and (suspected) exposure to covid-19: Secondary | ICD-10-CM | POA: Diagnosis present

## 2020-07-03 DIAGNOSIS — F10920 Alcohol use, unspecified with intoxication, uncomplicated: Secondary | ICD-10-CM

## 2020-07-03 DIAGNOSIS — F1024 Alcohol dependence with alcohol-induced mood disorder: Secondary | ICD-10-CM | POA: Diagnosis present

## 2020-07-03 DIAGNOSIS — T43226A Underdosing of selective serotonin reuptake inhibitors, initial encounter: Secondary | ICD-10-CM | POA: Diagnosis present

## 2020-07-03 DIAGNOSIS — Z888 Allergy status to other drugs, medicaments and biological substances status: Secondary | ICD-10-CM

## 2020-07-03 DIAGNOSIS — F1014 Alcohol abuse with alcohol-induced mood disorder: Secondary | ICD-10-CM | POA: Diagnosis present

## 2020-07-03 DIAGNOSIS — D61818 Other pancytopenia: Secondary | ICD-10-CM | POA: Diagnosis present

## 2020-07-03 DIAGNOSIS — Z87891 Personal history of nicotine dependence: Secondary | ICD-10-CM

## 2020-07-03 DIAGNOSIS — Z21 Asymptomatic human immunodeficiency virus [HIV] infection status: Secondary | ICD-10-CM | POA: Diagnosis present

## 2020-07-03 DIAGNOSIS — B20 Human immunodeficiency virus [HIV] disease: Secondary | ICD-10-CM | POA: Diagnosis present

## 2020-07-03 NOTE — ED Triage Notes (Signed)
Pt intoxicated. Reports drank  "a lot" before came in. Fell today, c/o right knee pains. States that he is homeless and needs help.

## 2020-07-03 NOTE — ED Provider Notes (Signed)
[facility]   CSN: [id number] Arrival date & time: 07/03/20  1758     History Chief Complaint  Patient presents with  . Alcohol Intoxication    Gary [name] is a 34 y.o. male.  The history is provided by the patient and medical records. No language interpreter was used.     34 year old male significant history of alcohol abuse, HIV, homelessness, presents ED with complaints of alcohol intoxication.  Patient admits that he drinks alcohol on a regular basis.  States that he drank heavily today.  He is here requesting for help with his alcohol.  He also admits that he is homeless and needing a place to stay.  States that he is having thoughts of self-harm by drinking alcohol but would like to get some help to turn around.  Denies any homicidal ideation.  Denies any recent drug use.  History however is limited due to patient being intoxicated.  Level 5 caveat applies due to altered mental status.  Patient report he has had Covid vaccination with [employer].    Past Medical History:  Diagnosis Date  . Alcohol abuse   . HIV (human immunodeficiency virus infection) (HCC)   . Subdural hematoma [facility])     Patient Active Problem List   Diagnosis Date Noted  . Alcohol abuse   . Alcohol poisoning   . Suicidal ideation 06/14/2020  . Pancreatitis 06/13/2020  . 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  . Malnutrition of moderate degree 05/07/2020  . Gastrointestinal hemorrhage   . Alcoholic hepatitis without ascites   . Melena 05/05/2020  . Fever 05/05/2020  . Alcoholic ketoacidosis 05/05/2020  . Alcohol withdrawal (HCC) 04/17/2020  . Thrombocytopenia (HCC) 07/25/2019  . Transaminitis 07/25/2019  . Diarrhea 07/25/2019  . Traumatic subdural hematoma (HCC) 07/02/2019  . Alcohol abuse with intoxication (HCC) 07/02/2019  . Alcohol abuse with alcohol-induced mood disorder (HCC) 05/13/2019  . Alcohol dependence (HCC) 04/16/2019  . 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 HIV disease (HCC) 06/16/2018  . Polysubstance abuse (HCC) 06/16/2018  . Cigarette smoker 06/16/2018    Past Surgical History:  Procedure Laterality Date  . INCISION AND DRAINAGE PERIRECTAL ABSCESS N/A 09/24/2016   Procedure: IRRIGATION AND DEBRIDEMENT PERIRECTAL ABSCESS;  Surgeon: Ricarda Frame, MD;  Location: [facility] ORS;  Service: General;  Laterality: N/A;  . none         Family History  Problem Relation Age of Onset  . Alcohol abuse Mother   . Alcohol abuse Father     Social History   Tobacco Use  . Smoking status: Former Smoker    Packs/day: 0.50    Types: Cigarettes  . Smokeless tobacco: Never Used  . Tobacco comment: unable to smoke while incarcerated 6+ months 02/13/20  Vaping Use  . Vaping Use: Never used  Substance Use Topics  . Alcohol use: Yes  . Drug use: Not Currently    Types: Marijuana, Methamphetamines    Comment: last used 04/10/2019    Home Medications Prior to Admission medications   Medication Sig Start Date End Date Taking? Authorizing Provider  chlordiazePOXIDE (LIBRIUM) 25 MG capsule 25 mg PO TID x 1 day, then 25 mg PO BID X 1 day,  then 25  PO QD X 1 day 06/29/20   Cathren Laine, MD  elvitegravir-cobicistat-emtricitabine-tenofovir (GENVOYA) 150-150-200-10 MG TABS tablet Take 1 tablet by mouth daily with breakfast. 05/18/20   Linwood Dibbles, MD  FLUoxetine (PROZAC) 20 MG  capsule Take 1 capsule (20 mg total) by mouth daily. 06/19/20 07/19/20  Kendell Bane, MD  Multiple Vitamin (MULTIVITAMIN WITH MINERALS) TABS tablet Take 1 tablet by mouth daily. 06/19/20 07/19/20  Shahmehdi, Gemma Payor, MD  nicotine (NICODERM CQ - DOSED IN MG/24 HOURS) 21 mg/24hr patch Place 1 patch (21 mg total) onto the skin daily. Patient not taking: Reported on 06/07/2020 05/09/20   Burnadette Pop, MD  pantoprazole (PROTONIX) 40 MG tablet Take 1 tablet (40 mg total) by mouth daily. Patient not taking:  Reported on 06/07/2020 05/09/20   Burnadette Pop, MD  sucralfate (CARAFATE) 1 GM/10ML suspension Take 10 mLs (1 g total) by mouth 4 (four) times daily -  with meals and at bedtime for 14 days. Patient not taking: Reported on 06/07/2020 05/09/20 06/07/20  Burnadette Pop, MD  famotidine (PEPCID) 20 MG tablet Take 1 tablet (20 mg total) by mouth 2 (two) times daily. Patient not taking: Reported on 06/01/2019 05/14/19 06/02/19  Benjiman Core, MD    Allergies    Tegretol [carbamazepine] and Caffeine  Review of Systems   Review of Systems  Unable to perform ROS: Mental status change    Physical Exam Updated Vital Signs BP (!) 130/103   Pulse (!) 109   Temp 99.2 F (37.3 C) (Oral)   Resp 20   SpO2 95%   Physical Exam Vitals and nursing note reviewed.  Constitutional:      General: He is not in acute distress.    Appearance: He is well-developed.     Comments: Patient is drowsy sleepy but easily arousable and answer questions.  He appears to be in no acute discomfort.  HENT:     Head: Atraumatic.  Eyes:     Extraocular Movements: Extraocular movements intact.     Conjunctiva/sclera: Conjunctivae normal.     Pupils: Pupils are equal, round, and reactive to light.  Cardiovascular:     Rate and Rhythm: Tachycardia present.     Pulses: Normal pulses.     Heart sounds: Normal heart sounds.  Pulmonary:     Effort: Pulmonary effort is normal.     Breath sounds: Normal breath sounds. No wheezing, rhonchi or rales.  Abdominal:     Palpations: Abdomen is soft.     Tenderness: There is no abdominal tenderness.  Musculoskeletal:     Cervical back: Normal range of motion and neck supple. No rigidity.     Comments: Moving all 4 extremities with equal effort.  Slightly tremulous.  Skin:    Findings: No rash.  Neurological:     GCS: GCS eye subscore is 4. GCS verbal subscore is 5. GCS motor subscore is 6.  Psychiatric:        Mood and Affect: Mood is depressed.        Speech: Speech is  slurred.        Behavior: Behavior is cooperative.        Thought Content: Thought content does not include homicidal or suicidal ideation.        Cognition and Memory: Cognition is impaired.     ED Results / Procedures / Treatments   Labs (all labs ordered are listed, but only abnormal results are displayed) Labs Reviewed - No data to display  EKG None  Radiology No results found.  Procedures Procedures (including critical care time)  Medications Ordered in ED Medications - No data to display  ED Course  I have reviewed the triage vital signs and the nursing notes.  Pertinent  labs & imaging results that were available during my care of the patient were reviewed by me and considered in my medical decision making (see chart for details).    MDM Rules/Calculators/A&P                          BP (!) 130/103   Pulse (!) 109   Temp 99.2 F (37.3 C) (Oral)   Resp 20   SpO2 95%   Final Clinical Impression(s) / ED Diagnoses Final diagnoses:  Alcoholic intoxication without complication (HCC)    Rx / DC Orders ED Discharge Orders    None     10:09 PM Patient with significant history of alcohol abuse, homelessness, here requesting for help with alcohol detox.  Last drink was today.  At this time patient appears intoxicated.  No true SI or HI.  Plan to have patient rest and metabolize to freedom.  Will sign out to oncoming team.   Fayrene Helper, PA-C 07/08/20 1457    Mancel Bale, MD 07/09/20 2003

## 2020-07-03 NOTE — ED Provider Notes (Signed)
34 year old male received at signout from Gary [name] pending metabolization of ethanol.  Per his HPI:  "Gary [name] is a 34 y.o. male.  The history is provided by the patient and medical records. No language interpreter was used.     34 year old male significant history of alcohol abuse, HIV, homelessness, presents ED with complaints of alcohol intoxication.  Patient admits that he drinks alcohol on a regular basis.  States that he drank heavily today.  He is here requesting for help with his alcohol.  He also admits that he is homeless and needing a place to stay.  States that he is having thoughts of self-harm by drinking alcohol but would like to get some help to turn around.  Denies any homicidal ideation.  Denies any recent drug use.  History however is limited due to patient being intoxicated.  Level 5 caveat applies due to altered mental status.  Patient report he has had Covid vaccination with J[employer]."   Physical Exam  BP 129/77   Pulse 90   Temp 98.2 F (36.8 C)   Resp 18   SpO2 100%   Physical Exam Vitals and nursing note reviewed.  Constitutional:      Appearance: He is well-developed.     Comments: Disheveled Retching  HENT:     Head: Normocephalic and atraumatic.  Cardiovascular:     Rate and Rhythm: Normal rate and regular rhythm.  Pulmonary:     Effort: Pulmonary effort is normal.  Musculoskeletal:     Cervical back: Neck supple.  Neurological:     Mental Status: He is alert.     Comments: Tremulous.  Speech is not slurred.  Alert and oriented x3.  No asterixis.   Psychiatric:        Behavior: Behavior normal.     ED Course/Procedures     Procedures  MDM   34 year old male with a history of HIV disease, alcohol use disorder, homelessness, prior GI bleeds, alcohol induced pancreatitis, and depression received a signout from PA [place] pending metabolization of ethanol.  Please see his note for further work-up and medical decision making.  Patient was  acutely intoxicated when evaluated by previous clinician.  Labs were not obtained.  Patient was reevaluated multiple times over the next few hours and was sleeping with equal, even respirations.  When he began to rouse, patient was nauseated, dry heaving, and very tremulous and appears to be in withdrawal.  Glucose was 63.  Patient was offered food and drink and repeat CBG was 167.  CIWA score was obtained around time of repeat CBG and CIWA was 15.  Patient reports his last drink was ~24 hours ago. Will start the patient on CIWA protocol and order basic labs.  Patient has previously been discharged with Librium, but anticipate patient would be a poor candidate for Librium taper at this time.  It also appears that he has not picked up previous prescriptions.  Patient care transferred to PA Cacc[place] at the end of my shift to follow-up on basic labs, repeat CIWA score, and clinical reevaluation patient.   Patient presentation, ED course, and plan of care discussed with review of all pertinent labs and imaging. Please see his/her note for further details regarding further ED course and disposition.        Barkley Boards, PA-C 07/04/20 9485    Dione Booze, MD 07/04/20 2203

## 2020-07-04 DIAGNOSIS — B20 Human immunodeficiency virus [HIV] disease: Secondary | ICD-10-CM

## 2020-07-04 DIAGNOSIS — R45851 Suicidal ideations: Secondary | ICD-10-CM

## 2020-07-04 DIAGNOSIS — F10920 Alcohol use, unspecified with intoxication, uncomplicated: Secondary | ICD-10-CM

## 2020-07-04 LAB — COMPREHENSIVE METABOLIC PANEL
ALT: 67 U/L — ABNORMAL HIGH (ref 0–44)
AST: 70 U/L — ABNORMAL HIGH (ref 15–41)
Albumin: 4.4 g/dL (ref 3.5–5.0)
Alkaline Phosphatase: 61 U/L (ref 38–126)
Anion gap: 21 — ABNORMAL HIGH (ref 5–15)
BUN: 10 mg/dL (ref 6–20)
CO2: 22 mmol/L (ref 22–32)
Calcium: 8.9 mg/dL (ref 8.9–10.3)
Chloride: 94 mmol/L — ABNORMAL LOW (ref 98–111)
Creatinine, Ser: 0.77 mg/dL (ref 0.61–1.24)
GFR calc Af Amer: >60 mL/min (ref >60)
GFR calc non Af Amer: >60 mL/min (ref >60)
Glucose, Bld: 195 mg/dL — ABNORMAL HIGH (ref 70–99)
Potassium: 4 mmol/L (ref 3.5–5.1)
Sodium: 137 mmol/L (ref 135–145)
Total Bilirubin: 1.1 mg/dL (ref 0.3–1.2)
Total Protein: 7.6 g/dL (ref 6.5–8.1)

## 2020-07-04 LAB — CBG MONITORING, ED
Glucose-Capillary: 63 mg/dL — ABNORMAL LOW (ref 70–99)
Glucose-Capillary: 167 mg/dL — ABNORMAL HIGH (ref 70–99)

## 2020-07-04 LAB — CBC
HCT: 41.2 % (ref 39.0–52.0)
Hemoglobin: 14 g/dL (ref 13.0–17.0)
MCH: 33.7 pg (ref 26.0–34.0)
MCHC: 34 g/dL (ref 30.0–36.0)
MCV: 99.3 fL (ref 80.0–100.0)
Platelets: 152 10*3/uL (ref 150–400)
RBC: 4.15 MIL/uL — ABNORMAL LOW (ref 4.22–5.81)
RDW: 13.6 % (ref 11.5–15.5)
WBC: 3.7 10*3/uL — ABNORMAL LOW (ref 4.0–10.5)
nRBC: 0 % (ref 0.0–0.2)

## 2020-07-04 LAB — SARS CORONAVIRUS 2 BY RT PCR (HOSPITAL ORDER, PERFORMED IN ~~LOC~~ HOSPITAL LAB): SARS Coronavirus 2: NEGATIVE

## 2020-07-04 LAB — ETHANOL: Alcohol, Ethyl (B): 182 mg/dL — ABNORMAL HIGH (ref <10)

## 2020-07-04 MED ORDER — LORAZEPAM 2 MG/ML IJ SOLN: 0.0000 mg | Freq: Two times a day (BID) | INTRAMUSCULAR | Status: DC

## 2020-07-04 MED ORDER — THIAMINE HCL 100 MG PO TABS
100.0000 mg | ORAL_TABLET | Freq: Every day | ORAL | Status: DC
  Administered 2020-07-04 – 2020-07-05 (×2): 100 mg via ORAL
  Filled 2020-07-04 (×2): qty 1

## 2020-07-04 MED ORDER — ACETAMINOPHEN 650 MG RE SUPP: 650.0000 mg | Freq: Four times a day (QID) | RECTAL | Status: DC | PRN

## 2020-07-04 MED ORDER — ENOXAPARIN SODIUM 40 MG/0.4ML ~~LOC~~ SOLN
40.0000 mg | SUBCUTANEOUS | Status: DC
  Administered 2020-07-05: 40 mg via SUBCUTANEOUS
  Filled 2020-07-04: qty 0.4

## 2020-07-04 MED ORDER — LORAZEPAM 1 MG PO TABS: 0.0000 mg | ORAL_TABLET | Freq: Two times a day (BID) | ORAL | Status: DC

## 2020-07-04 MED ORDER — ACETAMINOPHEN 325 MG PO TABS
650.0000 mg | ORAL_TABLET | Freq: Four times a day (QID) | ORAL | Status: DC | PRN
  Administered 2020-07-04: 650 mg via ORAL
  Filled 2020-07-04: qty 2

## 2020-07-04 MED ORDER — PANTOPRAZOLE SODIUM 40 MG PO TBEC
40.0000 mg | DELAYED_RELEASE_TABLET | Freq: Every day | ORAL | Status: DC
  Administered 2020-07-04 – 2020-07-05 (×2): 40 mg via ORAL
  Filled 2020-07-04 (×2): qty 1

## 2020-07-04 MED ORDER — LORAZEPAM 2 MG/ML IJ SOLN
2.0000 mg | Freq: Once | INTRAMUSCULAR | Status: AC
  Administered 2020-07-05: 2 mg via INTRAVENOUS
  Filled 2020-07-04: qty 1

## 2020-07-04 MED ORDER — LORAZEPAM 2 MG/ML IJ SOLN
0.0000 mg | Freq: Four times a day (QID) | INTRAMUSCULAR | Status: DC
  Administered 2020-07-04: 2 mg via INTRAVENOUS
  Administered 2020-07-05: 1 mg via INTRAVENOUS
  Filled 2020-07-04 (×2): qty 1

## 2020-07-04 MED ORDER — ELVITEG-COBIC-EMTRICIT-TENOFAF 150-150-200-10 MG PO TABS
1.0000 | ORAL_TABLET | Freq: Every day | ORAL | Status: DC
  Filled 2020-07-04 (×2): qty 1

## 2020-07-04 MED ORDER — THIAMINE HCL 100 MG/ML IJ SOLN: 100.0000 mg | Freq: Every day | INTRAMUSCULAR | Status: DC

## 2020-07-04 MED ORDER — LORAZEPAM 1 MG PO TABS
0.0000 mg | ORAL_TABLET | Freq: Four times a day (QID) | ORAL | Status: DC
  Administered 2020-07-04 (×2): 2 mg via ORAL
  Administered 2020-07-04 – 2020-07-05 (×2): 1 mg via ORAL
  Filled 2020-07-04 (×2): qty 2
  Filled 2020-07-04 (×2): qty 1

## 2020-07-04 MED ORDER — NICOTINE 21 MG/24HR TD PT24
21.0000 mg | MEDICATED_PATCH | Freq: Every day | TRANSDERMAL | Status: DC
  Filled 2020-07-04 (×2): qty 1

## 2020-07-04 MED ORDER — ONDANSETRON 4 MG PO TBDP
4.0000 mg | ORAL_TABLET | Freq: Once | ORAL | Status: AC
  Administered 2020-07-04: 4 mg via ORAL
  Filled 2020-07-04: qty 1

## 2020-07-04 NOTE — ED Provider Notes (Signed)
  Physical Exam  BP 121/74   Pulse 90   Temp 98.2 F (36.8 C)   Resp 18   SpO2 94%   Physical Exam Vitals and nursing note reviewed.  Constitutional:      General: He is not in acute distress.    Appearance: He is well-developed.  HENT:     Head: Normocephalic and atraumatic.  Eyes:     Conjunctiva/sclera: Conjunctivae normal.     Pupils: Pupils are equal, round, and reactive to light.  Cardiovascular:     Rate and Rhythm: Normal rate and regular rhythm.     Pulses: Normal pulses.  Pulmonary:     Effort: Pulmonary effort is normal. No respiratory distress.     Breath sounds: Normal breath sounds. No wheezing.  Abdominal:     General: There is no distension.     Palpations: Abdomen is soft. There is no mass.     Tenderness: There is no abdominal tenderness. There is no guarding or rebound.  Musculoskeletal:        General: Normal range of motion.     Cervical back: Normal range of motion and neck supple.     Comments: Patient very tremulous, but able to eat a sandwich.  Skin:    General: Skin is warm and dry.     Capillary Refill: Capillary refill takes less than 2 seconds.  Neurological:     Mental Status: He is alert and oriented to person, place, and time.     ED Course/Procedures     Procedures  MDM  Pt signed out to me by Marigene Ehlers, PA-C. Please see previous notes for further history.   In brief, pt presenting yesterday with alcohol intoxication. He slept all night, no PO intake. This am, he was mildly hypoglycemic at 63, improved with PO.  Patient was noted to be tremulous and mildly tachycardic.  CIWA score 15.  Given Ativan.  Plan to check labs, reassess CIWA, and dispo accordingly.  Labs interpreted by me, overall reassuring.  Mild elevation LFTs, however on chart review this is baseline.  Repeat CIWA still elevated at 9.  I discussed findings with patient.  Discussed whether patient would like to continue drinking or not.  He states he is interested in  quitting drinking. As he is going through withdrawals with an elevated ciwa, will consult with the hospitalist.   Discussed with Dr. Rhona Leavens from [facility], pt to be admitted. Requesting 2mg  ativan IV.      , PA-C 07/04/20 1015    07/06/20, MD 07/05/20 [phone]

## 2020-07-04 NOTE — ED Notes (Signed)
Called floor to inquire about bed approval; charge to approve shortly.

## 2020-07-04 NOTE — Progress Notes (Signed)
Unable to locate patient at this time. Will attempt to reassess him tomorrow. At this time he was last located in the ER. Patient very familiar to our services here. He has had about 8 ER visits in the past month for alcohol related services. Peer support consult has been placed for alcohol detox and rehab. Patient is under review at [facility] in [place].

## 2020-07-04 NOTE — Patient Outreach (Signed)
CPSS met with Pt an was able to complete series of question to gain more information to better assist Pt. CPSS asked Pt what services Pt are seeking an Pt stated [facility] in [place]. CPSS contacted [facility] in [place] an was made aware that [facility] in [place] have beds available. CPSS contacted [facility] an are waiting to hear back from facility.

## 2020-07-04 NOTE — ED Notes (Signed)
Pt given juice, ginger ale and sandwich, sat up in bed. Encouraged to eat/drink to increase blood sugar.

## 2020-07-04 NOTE — H&P (Signed)
History and Physical    Gary [name] QJJ:[id number] DOB: Sep 26, 1986 DOA: 07/03/2020  PCP: Patient, No Pcp Per  Patient coming from: Home  Chief Complaint: Shaking  HPI: Gary [name] is a 34 y.o. male with medical history significant of alcohol abuse, HIV who presented to the emergency department alcohol intoxication.  Patient is also homeless and he was keen on acquiring help for placement.  Patient reports drinking multiple bottles of wine on a daily basis for "a long time."  Last alcohol intake was 1 bottle of wine the morning prior to admission.  Patient reports multiple family members also have alcohol issues.  Patient himself feels depressed and verbalized "I just want to drink myself to death."   ED Course: In the ED, patient noted to have a CIWA score in excess of 15.  Patient did receive 2 mg of oral Ativan overnight.  Current CIWA score noted to be 9.  Patient actively shaking.  Given concerns of active alcohol withdrawals, hospitalist service consulted for consideration for medical admission  Review of Systems:  Review of Systems  Constitutional: Negative for chills and fever.  HENT: Negative for congestion, ear pain and tinnitus.   Eyes: Negative for double vision, photophobia and pain.  Respiratory: Negative for hemoptysis, sputum production and shortness of breath.   Cardiovascular: Negative for palpitations, orthopnea and claudication.  Gastrointestinal: Negative for abdominal pain, nausea and vomiting.  Genitourinary: Negative for frequency, hematuria and urgency.  Musculoskeletal: Negative for back pain, joint pain and neck pain.  Neurological: Positive for tremors. Negative for speech change, loss of consciousness and weakness.  Psychiatric/Behavioral: Positive for depression and suicidal ideas. Negative for memory loss. The patient does not have insomnia.     Past Medical History:  Diagnosis Date  . Alcohol abuse   . HIV (human immunodeficiency virus infection)  (HCC)   . Subdural hematoma [facility])     Past Surgical History:  Procedure Laterality Date  . INCISION AND DRAINAGE PERIRECTAL ABSCESS N/A 09/24/2016   Procedure: IRRIGATION AND DEBRIDEMENT PERIRECTAL ABSCESS;  Surgeon: Ricarda Frame, MD;  Location: [facility] ORS;  Service: General;  Laterality: N/A;  . none       reports that he has quit smoking. His smoking use included cigarettes. He smoked 0.50 packs per day. He has never used smokeless tobacco. He reports current alcohol use. He reports previous drug use. Drugs: Marijuana and Methamphetamines.  Allergies  Allergen Reactions  . Tegretol [Carbamazepine] Other (See Comments)    Caused vertigo for 2 days after taking it  . Caffeine Palpitations    Family History  Problem Relation Age of Onset  . Alcohol abuse Mother   . Alcohol abuse Father     Prior to Admission medications   Medication Sig Start Date End Date Taking? Authorizing Provider  elvitegravir-cobicistat-emtricitabine-tenofovir (GENVOYA) 150-150-200-10 MG TABS tablet Take 1 tablet by mouth daily with breakfast. 05/18/20  Yes Linwood Dibbles, MD  chlordiazePOXIDE (LIBRIUM) 25 MG capsule 25 mg PO TID x 1 day, then 25 mg PO BID X 1 day,  then 25  PO QD X 1 day Patient not taking: Reported on 07/04/2020 06/29/20   Cathren Laine, MD  FLUoxetine (PROZAC) 20 MG capsule Take 1 capsule (20 mg total) by mouth daily. Patient not taking: Reported on 07/04/2020 06/19/20 07/19/20  Kendell Bane, MD  Multiple Vitamin (MULTIVITAMIN WITH MINERALS) TABS tablet Take 1 tablet by mouth daily. Patient not taking: Reported on 07/04/2020 06/19/20 07/19/20  Kendell Bane, MD  nicotine (  NICODERM CQ - DOSED IN MG/24 HOURS) 21 mg/24hr patch Place 1 patch (21 mg total) onto the skin daily. Patient not taking: Reported on 06/07/2020 05/09/20   Burnadette Pop, MD  pantoprazole (PROTONIX) 40 MG tablet Take 1 tablet (40 mg total) by mouth daily. Patient not taking: Reported on 06/07/2020 05/09/20   Burnadette Pop,  MD  sucralfate (CARAFATE) 1 GM/10ML suspension Take 10 mLs (1 g total) by mouth 4 (four) times daily -  with meals and at bedtime for 14 days. Patient not taking: Reported on 06/07/2020 05/09/20 06/07/20  Burnadette Pop, MD  famotidine (PEPCID) 20 MG tablet Take 1 tablet (20 mg total) by mouth 2 (two) times daily. Patient not taking: Reported on 06/01/2019 05/14/19 06/02/19  Benjiman Core, MD    Physical Exam: Vitals:   07/04/20 0534 07/04/20 0652 07/04/20 0913 07/04/20 0932  BP: 121/74 129/77 118/80 118/80  Pulse: 90 90 84 87  Resp:  18 16   Temp:      TempSrc:      SpO2:  100% 96%     Constitutional: NAD, calm, comfortable Vitals:   07/04/20 0534 07/04/20 0652 07/04/20 0913 07/04/20 0932  BP: 121/74 129/77 118/80 118/80  Pulse: 90 90 84 87  Resp:  18 16   Temp:      TempSrc:      SpO2:  100% 96%    Eyes: PERRL, lids and conjunctivae normal ENMT: Mucous membranes are moist. Posterior pharynx clear of any exudate or lesions. Neck: normal, supple, no masses, no thyromegaly Respiratory: clear to auscultation bilaterally, no wheezing, normal respiratory effort Cardiovascular: Regular rate and rhythm, S1, S2 Abdomen: no tenderness, no masses palpated. No hepatosplenomegaly. Bowel sounds positive.  Musculoskeletal: no clubbing / cyanosis. No joint deformity upper and lower extremities. Good ROM, no contractures. Normal muscle tone.  Skin: no rashes, lesions,  Neurologic: CN 2-12 grossly intact. Sensation intact,. Strength 5/5 in all 4.  Active tremors Psychiatric: Normal judgment and insight. Alert and oriented x 3. Normal mood.    Labs on Admission: I have personally reviewed following labs and imaging studies  CBC: Recent Labs  Lab 06/28/20 1755 07/04/20 0613  WBC 5.0 3.7*  NEUTROABS 2.8  --   HGB 13.8 14.0  HCT 40.0 41.2  MCV 98.0 99.3  PLT 314 152   Basic Metabolic Panel: Recent Labs  Lab 06/28/20 1755 07/04/20 0613  NA 139 137  K 4.5 4.0  CL 102 94*  CO2 19*  22  GLUCOSE 290* 195*  BUN 14 10  CREATININE 0.73 0.77  CALCIUM 8.9 8.9   GFR: Estimated Creatinine Clearance: 126.3 mL/min (by C-G formula based on SCr of 0.77 mg/dL). Liver Function Tests: Recent Labs  Lab 06/28/20 1755 07/04/20 0613  AST 82* 70*  ALT 85* 67*  ALKPHOS 58 61  BILITOT 0.4 1.1  PROT 7.4 7.6  ALBUMIN 4.2 4.4   No results for input(s): LIPASE, AMYLASE in the last 168 hours. No results for input(s): AMMONIA in the last 168 hours. Coagulation Profile: No results for input(s): INR, PROTIME in the last 168 hours. Cardiac Enzymes: No results for input(s): CKTOTAL, CKMB, CKMBINDEX, TROPONINI in the last 168 hours. BNP (last 3 results) No results for input(s): PROBNP in the last 8760 hours. HbA1C: No results for input(s): HGBA1C in the last 72 hours. CBG: Recent Labs  Lab 06/28/20 2218 07/04/20 0515 07/04/20 0602  GLUCAP 85 63* 167*   Lipid Profile: No results for input(s): CHOL, HDL, LDLCALC, TRIG, CHOLHDL, LDLDIRECT  in the last 72 hours. Thyroid Function Tests: No results for input(s): TSH, T4TOTAL, FREET4, T3FREE, THYROIDAB in the last 72 hours. Anemia Panel: No results for input(s): VITAMINB12, FOLATE, FERRITIN, TIBC, IRON, RETICCTPCT in the last 72 hours. Urine analysis:    Component Value Date/Time   COLORURINE STRAW (A) 05/15/2020 1824   APPEARANCEUR CLEAR 05/15/2020 1824   APPEARANCEUR Clear 11/15/2014 1755   LABSPEC 1.003 (L) 05/15/2020 1824   LABSPEC 1.002 11/15/2014 1755   PHURINE 6.0 05/15/2020 1824   GLUCOSEU NEGATIVE 05/15/2020 1824   GLUCOSEU Negative 11/15/2014 1755   HGBUR NEGATIVE 05/15/2020 1824   BILIRUBINUR NEGATIVE 05/15/2020 1824   BILIRUBINUR Negative 11/15/2014 1755   KETONESUR NEGATIVE 05/15/2020 1824   PROTEINUR NEGATIVE 05/15/2020 1824   NITRITE NEGATIVE 05/15/2020 1824   LEUKOCYTESUR NEGATIVE 05/15/2020 1824   LEUKOCYTESUR Negative 11/15/2014 1755   Sepsis Labs:  !!!!!!!!!!!!!!!!!!!!!!!!!!!!!!!!!!!!!!!!!!!! @LABRCNTIP (procalcitonin:4,lacticidven:4) ) Recent Results (from the past 240 hour(s))  SARS Coronavirus 2 by RT PCR (hospital order[facility]) Nasopharyngeal Nasopharyngeal Swab     Status: None   Collection Time: 06/28/20  5:51 PM   Specimen: Nasopharyngeal Swab  Result Value Ref Range Status   SARS Coronavirus 2 NEGATIVE NEGATIVE Final    Comment: (NOTE) SARS-CoV-2 target nucleic acids are NOT DETECTED.  The SARS-CoV-2 RNA is generally detectable in upper and lower respiratory specimens during the acute phase of infection. The lowest concentration of SARS-CoV-2 viral copies this assay can detect is 250 copies / mL. A negative result does not preclude SARS-CoV-2 infection and should not be used as the sole basis for treatment or other patient management decisions.  A negative result may occur with improper specimen collection / handling, submission of specimen other than nasopharyngeal swab, presence of viral mutation(s) within the areas targeted by this assay, and inadequate number of viral copies (<250 copies / mL). A negative result must be combined with clinical observations, patient history, and epidemiological information.  Fact Sheet for Patients:   BoilerBrush.com.cyhttps://www.fda.gov/media/136312/download  Fact Sheet for Healthcare Providers: https://pope.com/https://www.fda.gov/media/136313/download  This test is not yet approved or  cleared by the [place] FDA and has been authorized for detection and/or diagnosis of SARS-CoV-2 by FDA under an Emergency Use Authorization (EUA).  This EUA will remain in effect (meaning this test can be used) for the duration of the COVID-19 declaration under Section 564(b)(1) of the Act, 21 U.S.C. section 360bbb-3(b)(1), unless the authorization is terminated or revoked sooner.  Performed at [facility], [address] [place], [place] [id number]      Radiological  Exams on Admission: No results found.  EKG: Independently reviewed.  Normal sinus rhythm with QTC 447 on most recent EKG from 06/28/2020  Assessment/Plan Principal Problem:   Alcohol withdrawal (HCC) Active Problems:   HIV disease (HCC)   Alcohol abuse with alcohol-induced mood disorder (HCC)   Suicidal ideation   1. Alcohol withdrawal 1. Patient reports drinking multiple bottles of wine on a daily basis for "a long time" 2. Last alcohol intake was 1 bottle of wine on the morning prior to hospital admission 3. CIWA score overnight noted to be as high as 15, currently 9 4. Agree with continuing CIWA protocol, would be liberal with Ativan. 2. History of HIV 1. Most recent viral load from April 2021 noted to be undetectable 2. CD4 from 09/19/2019 noted to be 320 3. Patient follows with infectious disease last seen by Rexene AlbertsStephanie Dixon, NP on 02/13/2020 3. Suicidal ideation/depression 1. Patient admitted  feeling markedly depressed and verbalized thoughts of killing himself via alcohol overdose 2. Consult psychiatry  DVT prophylaxis: Lovenox subq  Code Status: Full Family Communication:   Disposition Plan:   Consults called: Psychiatry Admission status: Inpatient as it would likely require greater than 2 midnight stay requiring IV medications to treat active alcohol withdrawals  Rickey Barbara MD [facility] Pager On Amion  If 7PM-7AM, please contact night-coverage  07/04/2020, 10:42 AM

## 2020-07-05 DIAGNOSIS — F1014 Alcohol abuse with alcohol-induced mood disorder: Secondary | ICD-10-CM

## 2020-07-05 DIAGNOSIS — F10230 Alcohol dependence with withdrawal, uncomplicated: Secondary | ICD-10-CM

## 2020-07-05 LAB — COMPREHENSIVE METABOLIC PANEL
ALT: 50 U/L — ABNORMAL HIGH (ref 0–44)
AST: 39 U/L (ref 15–41)
Albumin: 4.1 g/dL (ref 3.5–5.0)
Alkaline Phosphatase: 53 U/L (ref 38–126)
Anion gap: 9 (ref 5–15)
BUN: 9 mg/dL (ref 6–20)
CO2: 27 mmol/L (ref 22–32)
Calcium: 9.6 mg/dL (ref 8.9–10.3)
Chloride: 98 mmol/L (ref 98–111)
Creatinine, Ser: 0.63 mg/dL (ref 0.61–1.24)
GFR calc Af Amer: >60 mL/min (ref >60)
GFR calc non Af Amer: >60 mL/min (ref >60)
Glucose, Bld: 103 mg/dL — ABNORMAL HIGH (ref 70–99)
Potassium: 3.6 mmol/L (ref 3.5–5.1)
Sodium: 134 mmol/L — ABNORMAL LOW (ref 135–145)
Total Bilirubin: 1.2 mg/dL (ref 0.3–1.2)
Total Protein: 6.7 g/dL (ref 6.5–8.1)

## 2020-07-05 LAB — CBC
HCT: 37.2 % — ABNORMAL LOW (ref 39.0–52.0)
Hemoglobin: 12.8 g/dL — ABNORMAL LOW (ref 13.0–17.0)
MCH: 33.6 pg (ref 26.0–34.0)
MCHC: 34.4 g/dL (ref 30.0–36.0)
MCV: 97.6 fL (ref 80.0–100.0)
Platelets: 131 10*3/uL — ABNORMAL LOW (ref 150–400)
RBC: 3.81 MIL/uL — ABNORMAL LOW (ref 4.22–5.81)
RDW: 13.3 % (ref 11.5–15.5)
WBC: 3.1 10*3/uL — ABNORMAL LOW (ref 4.0–10.5)
nRBC: 0 % (ref 0.0–0.2)

## 2020-07-05 MED ORDER — LORAZEPAM 2 MG/ML IJ SOLN: 0.0000 mg | INTRAMUSCULAR | Status: DC

## 2020-07-05 MED ORDER — LORAZEPAM 1 MG PO TABS
0.0000 mg | ORAL_TABLET | ORAL | Status: DC
  Administered 2020-07-05: 2 mg via ORAL
  Filled 2020-07-05: qty 2

## 2020-07-05 MED ORDER — CHLORDIAZEPOXIDE HCL 25 MG PO CAPS
50.0000 mg | ORAL_CAPSULE | Freq: Once | ORAL | Status: AC
  Administered 2020-07-05: 50 mg via ORAL
  Filled 2020-07-05: qty 2

## 2020-07-05 NOTE — Consult Note (Signed)
[facility] Face-to-Face Psychiatry Consult   Reason for Consult:  Alcohol intoxication with suicidal ideations Referring Physician:  Dr Rhona Leavens Patient Identification: Gary [name] MRN:  [id number] Principal Diagnosis: Alcohol withdrawal (HCC) Diagnosis:  Principal Problem:   Alcohol withdrawal (HCC) Active Problems:   Alcohol dependence (HCC)   Alcohol abuse with alcohol-induced mood disorder (HCC)   HIV disease (HCC)   Suicidal ideation   Total Time spent with patient: 45 minutes  Subjective:   Gary [name] is a 34 y.o. male patient admitted with alcohol detox.  " I am angry as 58!"  Patient seen and evaluated in person by this provider.  Client presented for alcohol intoxication and desire to drink himself to death.  He is well-known to the ED since he has been in the emergency department 8 times in the past month, no suicide attempts.  Today he is angry because they are requiring that he wears an EKG leads to shower and he now wants to leave.  In the presence of his sitter, he denies suicidal/homicidal ideations, hallucinations, and withdrawal symptoms.  He feels like he is through the rough part of his detox and is demanding to leave.  Drinking daily and previously blood alcohol levels were 364 and 436, this time he presented at a alcohol level of 182 at 9 PM on August 25.  Dr. Jola Babinski reviewed this client and concurs with the plan to discharge per the patient's wishes, psychiatrically stable at this time.  HPI per MD, Dr Rhona Leavens:  HPI: Gary [name] is a 34 y.o. male with medical history significant of alcohol abuse, HIV who presented to the emergency department alcohol intoxication.  Patient is also homeless and he was keen on acquiring help for placement.  Patient reports drinking multiple bottles of wine on a daily basis for "a long time."  Last alcohol intake was 1 bottle of wine the morning prior to admission.  Patient reports multiple family members also have alcohol issues.  Patient  himself feels depressed and verbalized "I just want to drink myself to death."   ED Course: In the ED, patient noted to have a CIWA score in excess of 15.  Patient did receive 2 mg of oral Ativan overnight.  Current CIWA score noted to be 9.  Patient actively shaking.  Given concerns of active alcohol withdrawals, hospitalist service consulted for consideration for medical admission  Past Psychiatric History: Alcohol use disorder, depression, anxiety  Risk to Self:  None Risk to Others:  None Prior Inpatient Therapy:  Multiple Prior Outpatient Therapy:  None currently  Past Medical History:  Past Medical History:  Diagnosis Date  . Alcohol abuse   . HIV (human immunodeficiency virus infection) (HCC)   . Subdural hematoma [facility])     Past Surgical History:  Procedure Laterality Date  . INCISION AND DRAINAGE PERIRECTAL ABSCESS N/A 09/24/2016   Procedure: IRRIGATION AND DEBRIDEMENT PERIRECTAL ABSCESS;  Surgeon: Ricarda Frame, MD;  Location: [facility] ORS;  Service: General;  Laterality: N/A;  . none     Family History:  Family History  Problem Relation Age of Onset  . Alcohol abuse Mother   . Alcohol abuse Father    Family Psychiatric  History: See above Social History:  Social History   Substance and Sexual Activity  Alcohol Use Yes     Social History   Substance and Sexual Activity  Drug Use Not Currently  . Types: Marijuana, Methamphetamines   Comment: last used 04/10/2019    Social History  Socioeconomic History  . Marital status: Single    Spouse name: Not on file  . Number of children: Not on file  . Years of education: Not on file  . Highest education level: Not on file  Occupational History  . Occupation: unemployed  Tobacco Use  . Smoking status: Former Smoker    Packs/day: 0.50    Types: Cigarettes  . Smokeless tobacco: Never Used  . Tobacco comment: unable to smoke while incarcerated 6+ months 02/13/20  Vaping Use  . Vaping Use: Never used  Substance and  Sexual Activity  . Alcohol use: Yes  . Drug use: Not Currently    Types: Marijuana, Methamphetamines    Comment: last used 04/10/2019  . Sexual activity: Not Currently    Comment: unable to offer condoms while incarcerated  Other Topics Concern  . Not on file  Social History Narrative   "Currently living on the streets"   Independent at baseline   Social Determinants of Health   Financial Resource Strain:   . Difficulty of Paying Living Expenses: Not on file  Food Insecurity:   . Worried About Programme researcher, broadcasting/film/video in the Last Year: Not on file  . Ran Out of Food in the Last Year: Not on file  Transportation Needs:   . Lack of Transportation (Medical): Not on file  . Lack of Transportation (Non-Medical): Not on file  Physical Activity:   . Days of Exercise per Week: Not on file  . Minutes of Exercise per Session: Not on file  Stress:   . Feeling of Stress : Not on file  Social Connections:   . Frequency of Communication with Friends and Family: Not on file  . Frequency of Social Gatherings with Friends and Family: Not on file  . Attends Religious Services: Not on file  . Active Member of Clubs or Organizations: Not on file  . Attends Banker Meetings: Not on file  . Marital Status: Not on file   Additional Social History:    Allergies:   Allergies  Allergen Reactions  . Tegretol [Carbamazepine] Other (See Comments)    Caused vertigo for 2 days after taking it  . Caffeine Palpitations    Labs:  Results for orders placed or performed during the hospital encounter of 07/03/20 (from the past 48 hour(s))  POC CBG, ED     Status: Abnormal   Collection Time: 07/04/20  5:15 AM  Result Value Ref Range   Glucose-Capillary 63 (L) 70 - 99 mg/dL    Comment: Glucose reference range applies only to samples taken after fasting for at least 8 hours.   Comment 1 Notify RN   CBG monitoring, ED     Status: Abnormal   Collection Time: 07/04/20  6:02 AM  Result Value Ref  Range   Glucose-Capillary 167 (H) 70 - 99 mg/dL    Comment: Glucose reference range applies only to samples taken after fasting for at least 8 hours.  CBC     Status: Abnormal   Collection Time: 07/04/20  6:13 AM  Result Value Ref Range   WBC 3.7 (L) 4.0 - 10.5 K/uL   RBC 4.15 (L) 4.22 - 5.81 MIL/uL   Hemoglobin 14.0 13.0 - 17.0 g/dL   HCT 93.2 39 - 52 %   MCV 99.3 80.0 - 100.0 fL   MCH 33.7 26.0 - 34.0 pg   MCHC 34.0 30.0 - 36.0 g/dL   RDW 35.5 73.2 - 20.2 %   Platelets 152  150 - 400 K/uL   nRBC 0.0 0.0 - 0.2 %    Comment: Performed at [facility], [address], [place], [place] [address]  Comprehensive metabolic panel     Status: Abnormal   Collection Time: 07/04/20  6:13 AM  Result Value Ref Range   Sodium 137 135 - 145 mmol/L    Comment: REPEATED TO VERIFY   Potassium 4.0 3.5 - 5.1 mmol/L   Chloride 94 (L) 98 - 111 mmol/L    Comment: REPEATED TO VERIFY   CO2 22 22 - 32 mmol/L    Comment: REPEATED TO VERIFY   Glucose, Bld 195 (H) 70 - 99 mg/dL    Comment: Glucose reference range applies only to samples taken after fasting for at least 8 hours.   BUN 10 6 - 20 mg/dL   Creatinine, Ser 6.33 0.61 - 1.24 mg/dL   Calcium 8.9 8.9 - 35.4 mg/dL   Total Protein 7.6 6.5 - 8.1 g/dL   Albumin 4.4 3.5 - 5.0 g/dL   AST 70 (H) 15 - 41 U/L   ALT 67 (H) 0 - 44 U/L   Alkaline Phosphatase 61 38 - 126 U/L   Total Bilirubin 1.1 0.3 - 1.2 mg/dL   GFR calc non Af Amer >60 >60 mL/min   GFR calc Af Amer >60 >60 mL/min   Anion gap 21 (H) 5 - 15    Comment: REPEATED TO VERIFY Performed at [facility], [address], [place], [place] [address]   Ethanol     Status: Abnormal   Collection Time: 07/04/20  6:14 AM  Result Value Ref Range   Alcohol, Ethyl (B) 182 (H) <10 mg/dL    Comment: (NOTE) Lowest detectable limit for serum alcohol is 10 mg/dL.  For medical purposes only. Performed at [facility], [address], [place], [place] [address]   SARS Coronavirus 2 by RT PCR (hospital order, performed in [facility] hospital lab) Nasopharyngeal Nasopharyngeal Swab     Status: None   Collection Time: 07/04/20 10:20 AM   Specimen: Nasopharyngeal Swab  Result Value Ref Range   SARS Coronavirus 2 NEGATIVE NEGATIVE    Comment: (NOTE) SARS-CoV-2 target nucleic acids are NOT DETECTED.  The SARS-CoV-2 RNA is generally detectable in upper and lower respiratory specimens during the acute phase of infection. The lowest concentration of SARS-CoV-2 viral copies this assay can detect is 250 copies / mL. A negative result does not preclude SARS-CoV-2 infection and should not be used as the sole basis for treatment or other patient management decisions.  A negative result may occur with improper specimen collection / handling, submission of specimen other than nasopharyngeal swab, presence of viral mutation(s) within the areas targeted by this assay, and inadequate number of viral copies (<250 copies / mL). A negative result must be combined with clinical observations, patient history, and epidemiological information.  Fact Sheet for Patients:   BoilerBrush.com.cy  Fact Sheet for Healthcare Providers: https://pope.com/  This test is not yet approved or  cleared by the [place] FDA and has been authorized for detection and/or diagnosis of SARS-CoV-2 by FDA under an Emergency Use Authorization (EUA).  This EUA will remain in effect (meaning this test can be used) for the duration of the COVID-19 declaration under Section 564(b)(1) of the Act, 21 U.S.C. section 360bbb-3(b)(1), unless the authorization is terminated or revoked sooner.  Performed at [facility], [address], [place], [place] [address]  Comprehensive metabolic panel     Status: Abnormal   Collection Time: 07/05/20  4:43 AM  Result Value Ref Range   Sodium 134 (L) 135  - 145 mmol/L   Potassium 3.6 3.5 - 5.1 mmol/L   Chloride 98 98 - 111 mmol/L   CO2 27 22 - 32 mmol/L   Glucose, Bld 103 (H) 70 - 99 mg/dL    Comment: Glucose reference range applies only to samples taken after fasting for at least 8 hours.   BUN 9 6 - 20 mg/dL   Creatinine, Ser 1.610.63 0.61 - 1.24 mg/dL   Calcium 9.6 8.9 - 09.610.3 mg/dL   Total Protein 6.7 6.5 - 8.1 g/dL   Albumin 4.1 3.5 - 5.0 g/dL   AST 39 15 - 41 U/L   ALT 50 (H) 0 - 44 U/L   Alkaline Phosphatase 53 38 - 126 U/L   Total Bilirubin 1.2 0.3 - 1.2 mg/dL   GFR calc non Af Amer >60 >60 mL/min   GFR calc Af Amer >60 >60 mL/min   Anion gap 9 5 - 15    Comment: Performed at [facility], [address] [place], [place] [address]  CBC     Status: Abnormal   Collection Time: 07/05/20  4:43 AM  Result Value Ref Range   WBC 3.1 (L) 4.0 - 10.5 K/uL   RBC 3.81 (L) 4.22 - 5.81 MIL/uL   Hemoglobin 12.8 (L) 13.0 - 17.0 g/dL   HCT 98.137.2 (L) 39 - 52 %   MCV 97.6 80.0 - 100.0 fL   MCH 33.6 26.0 - 34.0 pg   MCHC 34.4 30.0 - 36.0 g/dL   RDW 19.113.3 47.811.5 - 29.515.5 %   Platelets 131 (L) 150 - 400 K/uL   nRBC 0.0 0.0 - 0.2 %    Comment: Performed at [facility], [address] [place], [place] [address]    Current Facility-Administered Medications  Medication Dose Route Frequency Provider Last Rate Last Admin  . acetaminophen (TYLENOL) tablet 650 mg  650 mg Oral Q6H PRN Jerald Kiefhiu, Stephen K, MD   650 mg at 07/04/20 1624   Or  . acetaminophen (TYLENOL) suppository 650 mg  650 mg Rectal Q6H PRN Jerald Kiefhiu, Stephen K, MD      . elvitegravir-cobicistat-emtricitabine-tenofovir (GENVOYA) 150-150-200-10 MG tablet 1 tablet  1 tablet Oral Q breakfast Jerald Kiefhiu, Stephen K, MD      . enoxaparin (LOVENOX) injection 40 mg  40 mg Subcutaneous Q24H Jerald Kiefhiu, Stephen K, MD   40 mg at 07/05/20 1219  . [START ON 07/06/2020] LORazepam (ATIVAN) injection 0-4 mg  0-4 mg Intravenous Q12H McDonald, Mia A, PA-C       Or  . [START ON 07/06/2020]  LORazepam (ATIVAN) tablet 0-4 mg  0-4 mg Oral Q12H McDonald, Mia A, PA-C      . LORazepam (ATIVAN) injection 0-4 mg  0-4 mg Intravenous Q4H Rhetta MuraSamtani, Jai-Gurmukh, MD       Or  . LORazepam (ATIVAN) tablet 0-4 mg  0-4 mg Oral Q4H Samtani, Jai-Gurmukh, MD   2 mg at 07/05/20 1025  . nicotine (NICODERM CQ - dosed in mg/24 hours) patch 21 mg  21 mg Transdermal Daily Jerald Kiefhiu, Stephen K, MD      . pantoprazole (PROTONIX) EC tablet 40 mg  40 mg Oral Daily Jerald Kiefhiu, Stephen K, MD   40 mg at 07/05/20 0914  . thiamine tablet 100 mg  100 mg Oral Daily McDonald, Mia A, PA-C  100 mg at 07/05/20 1610   Or  . thiamine (B-1) injection 100 mg  100 mg Intravenous Daily McDonald, Mia A, PA-C        Musculoskeletal: Strength & Muscle Tone: within normal limits Gait & Station: normal Patient leans: N/A  Psychiatric Specialty Exam: Physical Exam Vitals and nursing note reviewed.  Constitutional:      Appearance: Normal appearance.  HENT:     Head: Normocephalic.     Nose: Nose normal.  Pulmonary:     Effort: Pulmonary effort is normal.  Musculoskeletal:        General: Normal range of motion.     Cervical back: Normal range of motion.  Neurological:     General: No focal deficit present.     Mental Status: He is alert and oriented to person, place, and time.  Psychiatric:        Attention and Perception: Attention and perception normal.        Mood and Affect: Affect is angry.        Speech: Speech normal.        Behavior: Behavior is agitated.        Thought Content: Thought content normal.        Cognition and Memory: Cognition and memory normal.        Judgment: Judgment is impulsive.     Review of Systems  Psychiatric/Behavioral: Positive for agitation and behavioral problems. The patient is nervous/anxious.   All other systems reviewed and are negative.   Blood pressure (!) 124/96, pulse (!) 108, temperature 98.7 F (37.1 C), temperature source Oral, resp. rate 18, height  (1.854 m), weight  68 kg, SpO2 100 %.Body mass index is 19.78 kg/m.  General Appearance: Casual  Eye Contact:  Good  Speech:  Normal Rate  Volume:  Increased  Mood:  Angry  Affect:  Congruent  Thought Process:  Coherent and Descriptions of Associations: Intact  Orientation:  Full (Time, Place, and Person)  Thought Content:  WDL and Logical  Suicidal Thoughts:  No  Homicidal Thoughts:  No  Memory:  Immediate;   Good Recent;   Good Remote;   Good  Judgement:  Fair  Insight:  Fair  Psychomotor Activity:  Normal  Concentration:  Concentration: Good and Attention Span: Good  Recall:  Good  Fund of Knowledge:  Fair  Language:  Good  Akathisia:  No  Handed:  Right  AIMS (if indicated):     Assets:  Leisure Time Resilience  ADL's:  Intact  Cognition:  WNL  Sleep:        Treatment Plan Summary: Daily contact with patient to assess and evaluate symptoms and progress in treatment, Medication management and Plan Alcohol use disorder, severe  -Refrain from alcohol and drug use -Attend 12-step program with a sponsor -Follow-up with [facility] for peers support in the emergency department for rehab  Disposition: No evidence of imminent risk to self or others at present.    Nanine Means, NP 07/05/2020 4:29 PM

## 2020-07-05 NOTE — Progress Notes (Signed)
Pt cleared by psych and allowed to leave AMA. Pt signed form and spoke with MD.

## 2020-07-05 NOTE — Progress Notes (Signed)
PROGRESS NOTE    Gary [name]  EZM:[id number] DOB: [birth year] DOA: 07/03/2020 PCP: Patient, No Pcp Per  Brief Narrative:  57 white male diagnosed with HIV 3 years ago per his report-not compliant on meds as they were lost in one of his drunken state Chronic ethanolism drinks 6 bottles of wine a day/2/5 of liquor/vodka Lives in [place] with a friend at times however lives by himself (is evasive about his home conditions) Suicidality when he drinks Recent admission 8/5 through 06/18/2020 psychiatry saw him at that time and he was recommended to start on Prozac at that admission He has had 8 emergency room visits over the past several months  Alcohol withdrawal scale was 15 received Ativan in the ED his CIWA scale overnight has been anywhere from 7-15   Assessment & Plan:   Principal Problem:   Alcohol withdrawal (HCC) Active Problems:   HIV disease (HCC)   Alcohol abuse with alcohol-induced mood disorder (HCC)   Suicidal ideation   1. Acute ethanol intoxication and current withdrawal a. He is scoring 15 on the withdrawal scale b. Give Librium 50x1 and increased frequency of Ativan to every 4 hourly c. We will keep him on the current unit unless he decompensates which is unlikely d. I expect he will start resolving within the next 48 hours as his last drink was on the 25th the evening 2. HIV noncompliant on medications a. Lost his medication b. Not clear when he last took it c. We will reimplement the same Genvoya daily with breakfast and CC Rexene Alberts, NP who sees him in the clinic on discharge 3. Pancytopenia likely secondary to HIV/possible cirrhosis liver a. Monitor trends b. Get differential in a.m. c. Get INR in a.m. 4. ? Homelessness state a. TOC consulted to assist with planning  5. Suicidality a. Psychiatry consulted to see the patient b. He remains on safety precautions because of suicidality c. He will probably need an injectable antidepressant as he is  not taking his Prozac 6. Noncompliance with medical therapy 7. High frequency/high emergency room use patient  DVT prophylaxis: Lovenox Code Status: Full Family Communication: None Disposition: Inpatient  Status is: Inpatient  Remains inpatient appropriate because:Persistent severe electrolyte disturbances, Ongoing active pain requiring inpatient pain management and IV treatments appropriate due to intensity of illness or inability to take PO   Dispo: The patient is from: Home              Anticipated d/c is to: Unclear at this time              Anticipated d/c date is: 3 days              Patient currently is not medically stable to d/c.    Psychiatry   Consultants:   Psychiatry  Procedures: None  Antimicrobials: No   Subjective: Quite depressed-flat affect Tells me he last drank several days ago Evasive about where he stays Has been to alcohol withdrawal several times Asking for inpatient detox No chest pain fever chills  Objective: Vitals:   07/04/20 1945 07/04/20 1946 07/04/20 2343 07/05/20 0400  BP:  127/82 126/79 121/83  Pulse:  74 70 82  Resp:  20 18 18   Temp:  97.9 F (36.6 C) 98.6 F (37 C) 99 F (37.2 C)  TempSrc:  Oral Oral Oral  SpO2:  98% 98% 98%  Weight: 68 kg     Height: 6\' 1"  (1.854 m)       Intake/Output Summary (  Last 24 hours) at 07/05/2020 1008 Last data filed at 07/04/2020 1840 Gross per 24 hour  Intake 240 ml  Output --  Net 240 ml   Filed Weights   07/04/20 1945  Weight: 68 kg    Examination:  General exam: Flat affect very emaciated Respiratory system: Clear no added sound Cardiovascular system: S1-S2 no murmur rub or gallop Gastrointestinal system: Soft nontender no rebound-slight tenderness in epigastrium however Central nervous system: Intact moving all 4 limbs equally Extremities: Joints are intact without any range of motion restriction Skin: No edema Psychiatry: Flat affect  Data Reviewed: I have personally  reviewed following labs and imaging studies ALT is 50 down from 67 White count is 3.1 platelets are 131  Radiology Studies: No results found.   Scheduled Meds: . chlordiazePOXIDE  50 mg Oral Once  . elvitegravir-cobicistat-emtricitabine-tenofovir  1 tablet Oral Q breakfast  . enoxaparin (LOVENOX) injection  40 mg Subcutaneous Q24H  . [START ON 07/06/2020] LORazepam  0-4 mg Intravenous Q12H   Or  . [START ON 07/06/2020] LORazepam  0-4 mg Oral Q12H  . LORazepam  0-4 mg Intravenous Q4H   Or  . LORazepam  0-4 mg Oral Q4H  . nicotine  21 mg Transdermal Daily  . pantoprazole  40 mg Oral Daily  . thiamine  100 mg Oral Daily   Or  . thiamine  100 mg Intravenous Daily   Continuous Infusions:   LOS: 1 day    Time spent: 26  Rhetta Mura, MD [facility] To contact the attending provider between 7A-7P or the covering provider during after hours 7P-7A, please log into the web site [email or web address] and access using universal [place] password for that web site. If you do not have the password, please call the hospital operator.  07/05/2020, 10:08 AM

## 2020-07-05 NOTE — TOC Progression Note (Addendum)
Transition of Care [facility]) - Discharge note   Patient Details  Name: Gary [name] MRN: [id number] Date of Birth: [birth year]  [facility] CM/SW Contact  Darleene Cleaver, [place] Phone Number: 07/05/2020, 4:46 PM  Clinical Narrative:     CSW received consult that patient is homeless and has history of substance abuse.  CSW was informed patient left AMA.  Psych cleared him of suicidal risk.  CSW signing off    Expected Discharge Plan and Services           Expected Discharge Date:  (unknown)                                     Social Determinants of Health (SDOH) Interventions    Readmission Risk Interventions Readmission Risk Prevention Plan 06/19/2018  Post Dischage Appt Complete  Medication Screening Complete  Transportation Screening Complete  PCP follow-up Complete  Some recent data might be hidden

## 2020-07-05 NOTE — Progress Notes (Signed)
Pt stating that he wants to leave AMA. Charge nurse, [facility] and MD notified. IVC order placed. Case mgt notified and processing paperwork. Will continue to monitor.

## 2020-07-05 NOTE — Progress Notes (Signed)
OT Cancellation Note  Patient Details Name: Gary [name] MRN: [id number] DOB: [birth year]   Cancelled Treatment:   Attempted pt x 2 in AM.  First 1:1 sitter stated that pt required Do not Disturb due to trying to sleep.  2nd attempt pt's nurse requested hold as pt just given ativan for agitation and is trying to sleep.  Will reattempt as able.   Reason Eval/Treat Not Completed: Patient not medically ready;Other (comment)  Theodoro Clock 07/05/2020, 1:28 PM

## 2020-07-05 NOTE — Discharge Summary (Signed)
Physician Discharge Summary  Gary [name] CWC:[id number] DOB: [birth year] DOA: 07/03/2020  PCP: Patient, No Pcp Per  Admit date: 07/03/2020 Discharge date: 07/05/2020  Time spent: 10 minutes  Recommendations for Outpatient Follow-up:  1. No recommendations were made as patient left AGAINST MEDICAL ADVICE  Discharge Diagnoses:  Principal Problem:   Alcohol withdrawal (HCC) Active Problems:   HIV disease (HCC)   Alcohol dependence (HCC)   Alcohol abuse with alcohol-induced mood disorder (HCC)   Suicidal ideation   Discharge Condition: Guarded  Diet recommendation: No recommendations  Filed Weights   07/04/20 1945  Weight: 60 kg  34 year old male diagnosed with HIV Probably in 2018 Chronic ethanolism, homeless, multiple suicidal attempts with 8 visits to emergency room and this admission in the past 3 months Admitted with acute alcohol intoxication CIWA scores ranging from 7-15-he was kept on the Ativan protocol Because of unclarity regarding his psych diagnosis and prior suicidality when he drinks, psych was consulted-patient requested leave AGAINST MEDICAL ADVICE and he was cleared by psychiatry Per report from RN No recommendations were made on discharge-he represents a very high risk for readmission death and or harm because of his withdrawal state in addition to his uncontrolled and noncompliant behavior with his HAART meds I saw the patient on the floor the afternoon of discharge and he told me that the reason for him wanting to leave was because he was told he could shower with his EKG monitor leads on-later on the nurse told me that this was inaccurate and not what had transpired In either event it is hoped that the patient will get the treatment he seeks  Discharge Exam: Vitals:   07/05/20 0400 07/05/20 1618  BP: 121/83 (!) 124/96  Pulse: 82 (!) 108  Resp: 18 18  Temp: 99 F (37.2 C) 98.7 F (37.1 C)  SpO2: 98% 100%   Not examined on discharge as he was walking  out the door  Discharge Instructions  No recommendations on discharge were made no med reconciliation was done as he left AGAINST MEDICAL ADVICE   Allergies  Allergen Reactions  . Tegretol [Carbamazepine] Other (See Comments)    Caused vertigo for 2 days after taking it  . Caffeine Palpitations      The results of significant diagnostics from this hospitalization (including imaging, microbiology, ancillary and laboratory) are listed below for reference.    Significant Diagnostic Studies: DG Chest 2 View  Result Date: 06/28/2020 CLINICAL DATA:  Altered mental status.  Hyperglycemia. EXAM: CHEST - 2 VIEW COMPARISON:  Single-view of the chest 06/13/2020. FINDINGS: Lungs clear. Heart size normal. No pneumothorax or pleural fluid. No acute or focal bony abnormality. IMPRESSION: Negative chest. Electronically Signed   By: Drusilla Kanner M.D.   On: 06/28/2020 18:51   CT Head Wo Contrast  Result Date: 06/28/2020 CLINICAL DATA:  Head trauma, abnormal mental status. EXAM: CT HEAD WITHOUT CONTRAST TECHNIQUE: Contiguous axial images were obtained from the base of the skull through the vertex without intravenous contrast. COMPARISON:  Prior head CT 06/23/2020 and earlier FINDINGS: Brain: Mild generalized parenchymal atrophy is advanced for age. There is no acute intracranial hemorrhage. No demarcated cortical infarct. No extra-axial fluid collection. No evidence of intracranial mass. No midline shift. Vascular: No hyperdense vessel. Skull: Normal. Negative for fracture or focal lesion. Sinuses/Orbits: Visualized orbits show no acute finding. No significant paranasal sinus disease or mastoid effusion at the imaged levels. IMPRESSION: No evidence of acute intracranial abnormality. Mild generalized parenchymal atrophy of the brain which  is advanced for age. Electronically Signed   By: Jackey LogeKyle  Golden DO   On: 06/28/2020 18:43   CT Head Wo Contrast  Result Date: 06/23/2020 CLINICAL DATA:  Mental status  change EXAM: CT HEAD WITHOUT CONTRAST TECHNIQUE: Contiguous axial images were obtained from the base of the skull through the vertex without intravenous contrast. COMPARISON:  CT brain 07/17/2019 FINDINGS: Brain: No acute territorial infarction, hemorrhage or intracranial mass. The ventricles are nonenlarged. Vascular: No hyperdense vessels.  No unexpected calcification. Skull: Normal. Negative for fracture or focal lesion. Sinuses/Orbits: No acute finding. Other: None IMPRESSION: Negative non contrasted CT appearance of the brain. Electronically Signed   By: Jasmine PangKim  Fujinaga M.D.   On: 06/23/2020 23:20   CT Cervical Spine Wo Contrast  Result Date: 06/23/2020 CLINICAL DATA:  Altered mental status EXAM: CT CERVICAL SPINE WITHOUT CONTRAST TECHNIQUE: Multidetector CT imaging of the cervical spine was performed without intravenous contrast. Multiplanar CT image reconstructions were also generated. COMPARISON:  CT 07/01/2019 FINDINGS: Alignment: Mild rotation of C1 on C2, likely due to positioning. Sagittal alignment is within normal limits. Facets are normally aligned. Skull base and vertebrae: No acute fracture. Chronic minimal wedging of C5. Soft tissues and spinal canal: No prevertebral fluid or swelling. No visible canal hematoma. Disc levels: The disc spaces are relatively maintained. Minimal degenerative spurring at multiple levels. Upper chest: Negative. Other: None IMPRESSION: No CT evidence for acute osseous abnormality. Electronically Signed   By: Jasmine PangKim  Fujinaga M.D.   On: 06/23/2020 23:27   US Abdomen Limited  Result Date: 06/13/2020 CLINICAL DATA:  Upper abdominal pain. EXAM: ULTRASOUND ABDOMEN LIMITED RIGHT UPPER QUADRANT COMPARISON:  None. FINDINGS: Gallbladder: No gallstones or wall thickening visualized (2.2 mm). No sonographic Murphy sign noted by sonographer. Common bile duct: Diameter: 5.3 mm Liver: The liver is enlarged and measures 19.1 cm in length. No focal lesion identified. Diffusely  increased echogenicity of the liver parenchyma is noted. Portal vein is patent on color Doppler imaging with normal direction of blood flow towards the liver. Other: None. IMPRESSION: Enlarged, fatty liver. Electronically Signed   By: Aram Candelahaddeus  Houston M.D.   On: 06/13/2020 23:56   DG Chest Port 1 View  Result Date: 06/13/2020 CLINICAL DATA:  Tremors EXAM: PORTABLE CHEST 1 VIEW COMPARISON:  05/04/2020 FINDINGS: The heart size and mediastinal contours are within normal limits. Both lungs are clear. The visualized skeletal structures are unremarkable. IMPRESSION: No active disease. Electronically Signed   By: Jasmine PangKim  Fujinaga M.D.   On: 06/13/2020 22:19   DG Hand Complete Left  Result Date: 06/13/2020 CLINICAL DATA:  Left hand pain and swelling. Redness. EXAM: LEFT HAND - COMPLETE 3+ VIEW COMPARISON:  Radiograph 07/19/2017 FINDINGS: There is no evidence of fracture or dislocation. The digits are held in flexion on all views. There is no evidence of arthropathy or other focal bone abnormality. Mild soft tissue edema overlies the dorsum of the metacarpals. No soft tissue air or radiopaque foreign body. IMPRESSION: Soft tissue edema over the dorsum of the metacarpals. No acute osseous abnormality. Electronically Signed   By: Narda RutherfordMelanie  Sanford M.D.   On: 06/13/2020 22:04    Microbiology: Recent Results (from the past 240 hour(s))  SARS Coronavirus 2 by RT PCR (hospital order, [facility]) Nasopharyngeal Nasopharyngeal Swab     Status: None   Collection Time: 06/28/20  5:51 PM   Specimen: Nasopharyngeal Swab  Result Value Ref Range Status   SARS Coronavirus 2 NEGATIVE NEGATIVE Final  Comment: (NOTE) SARS-CoV-2 target nucleic acids are NOT DETECTED.  The SARS-CoV-2 RNA is generally detectable in upper and lower respiratory specimens during the acute phase of infection. The lowest concentration of SARS-CoV-2 viral copies this assay can detect is 250 copies / mL. A negative result  does not preclude SARS-CoV-2 infection and should not be used as the sole basis for treatment or other patient management decisions.  A negative result may occur with improper specimen collection / handling, submission of specimen other than nasopharyngeal swab, presence of viral mutation(s) within the areas targeted by this assay, and inadequate number of viral copies (<250 copies / mL). A negative result must be combined with clinical observations, patient history, and epidemiological information.  Fact Sheet for Patients:   BoilerBrush.com.cy  Fact Sheet for Healthcare Providers: https://pope.com/  This test is not yet approved or  cleared by the [place] FDA and has been authorized for detection and/or diagnosis of SARS-CoV-2 by FDA under an Emergency Use Authorization (EUA).  This EUA will remain in effect (meaning this test can be used) for the duration of the COVID-19 declaration under Section 564(b)(1) of the Act, 21 U.S.C. section 360bbb-3(b)(1), unless the authorization is terminated or revoked sooner.  Performed at [facility], [address] [place], [place] [address]   SARS Coronavirus 2 by RT PCR (hospital order, performed in [facility] hospital lab) Nasopharyngeal Nasopharyngeal Swab     Status: None   Collection Time: 07/04/20 10:20 AM   Specimen: Nasopharyngeal Swab  Result Value Ref Range Status   SARS Coronavirus 2 NEGATIVE NEGATIVE Final    Comment: (NOTE) SARS-CoV-2 target nucleic acids are NOT DETECTED.  The SARS-CoV-2 RNA is generally detectable in upper and lower respiratory specimens during the acute phase of infection. The lowest concentration of SARS-CoV-2 viral copies this assay can detect is 250 copies / mL. A negative result does not preclude SARS-CoV-2 infection and should not be used as the sole basis for treatment or other patient management decisions.  A negative result  may occur with improper specimen collection / handling, submission of specimen other than nasopharyngeal swab, presence of viral mutation(s) within the areas targeted by this assay, and inadequate number of viral copies (<250 copies / mL). A negative result must be combined with clinical observations, patient history, and epidemiological information.  Fact Sheet for Patients:   BoilerBrush.com.cy  Fact Sheet for Healthcare Providers: https://pope.com/  This test is not yet approved or  cleared by the [place] FDA and has been authorized for detection and/or diagnosis of SARS-CoV-2 by FDA under an Emergency Use Authorization (EUA).  This EUA will remain in effect (meaning this test can be used) for the duration of the COVID-19 declaration under Section 564(b)(1) of the Act, 21 U.S.C. section 360bbb-3(b)(1), unless the authorization is terminated or revoked sooner.  Performed at [facility] [address] [place], [place] [address]      Labs: Basic Metabolic Panel: Recent Labs  Lab 06/28/20 1755 07/04/20 0613 07/05/20 0443  NA 139 137 134*  K 4.5 4.0 3.6  CL 102 94* 98  CO2 19* 22 27  GLUCOSE 290* 195* 103*  BUN 14 10 9   CREATININE 0.73 0.77 0.63  CALCIUM 8.9 8.9 9.6   Liver Function Tests: Recent Labs  Lab 06/28/20 1755 07/04/20 0613 07/05/20 0443  AST 82* 70* 39  ALT 85* 67* 50*  ALKPHOS 58 61 53  BILITOT 0.4 1.1 1.2  PROT 7.4 7.6 6.7  ALBUMIN 4.2 4.4 4.1   No results for input(s): LIPASE, AMYLASE in the last 168 hours. No results for input(s): AMMONIA in the last 168 hours. CBC: Recent Labs  Lab 06/28/20 1755 07/04/20 0613 07/05/20 0443  WBC 5.0 3.7* 3.1*  NEUTROABS 2.8  --   --   HGB 13.8 14.0 12.8*  HCT 40.0 41.2 37.2*  MCV 98.0 99.3 97.6  PLT 314 152 131*   Cardiac Enzymes: No results for input(s): CKTOTAL, CKMB, CKMBINDEX, TROPONINI in the last 168 hours. BNP: BNP  (last 3 results) No results for input(s): BNP in the last 8760 hours.  ProBNP (last 3 results) No results for input(s): PROBNP in the last 8760 hours.  CBG: Recent Labs  Lab 06/28/20 2218 07/04/20 0515 07/04/20 0602  GLUCAP 85 63* 167*       Signed:  Rhetta Mura MD   [facility] 07/05/2020, 4:34 PM

## 2020-07-05 NOTE — Progress Notes (Signed)
PT Cancellation Note  Patient Details Name: Gary [name] MRN: [id number] DOB: 1986/02/14   Cancelled Treatment:    Reason Eval/Treat Not Completed: Other (comment) Spoke with RN who reports pt agitated with people coming in room and request to let him rest.  States that he is walking to bathroom.  Will f/u as able.   Anise Salvo, PT Acute Rehab Services Pager [phone] [facility] [phone]    [name] 07/05/2020, 1:55 PM

## 2020-07-06 ENCOUNTER — Emergency Department (HOSPITAL_COMMUNITY): Payer: Self-pay

## 2020-07-06 ENCOUNTER — Other Ambulatory Visit: Payer: Self-pay

## 2020-07-06 ENCOUNTER — Encounter (HOSPITAL_COMMUNITY): Payer: Self-pay | Admitting: Emergency Medicine

## 2020-07-06 ENCOUNTER — Emergency Department (HOSPITAL_COMMUNITY)
Admission: EM | Admit: 2020-07-06 | Discharge: 2020-07-06 | Disposition: A | Discharge status: Home / Self Care | Payer: Self-pay | Attending: Emergency Medicine | Admitting: Emergency Medicine

## 2020-07-06 DIAGNOSIS — Z79899 Other long term (current) drug therapy: Secondary | ICD-10-CM | POA: Insufficient documentation

## 2020-07-06 DIAGNOSIS — R531 Weakness: Secondary | ICD-10-CM | POA: Insufficient documentation

## 2020-07-06 DIAGNOSIS — Z87891 Personal history of nicotine dependence: Secondary | ICD-10-CM | POA: Insufficient documentation

## 2020-07-06 DIAGNOSIS — F1010 Alcohol abuse, uncomplicated: Secondary | ICD-10-CM

## 2020-07-06 LAB — CBC WITH DIFFERENTIAL/PLATELET
Abs Immature Granulocytes: 0.02 10*3/uL (ref 0.00–0.07)
Basophils Absolute: 0 10*3/uL (ref 0.0–0.1)
Basophils Relative: 1 %
Eosinophils Absolute: 0.1 10*3/uL (ref 0.0–0.5)
Eosinophils Relative: 2 %
HCT: 38.2 % — ABNORMAL LOW (ref 39.0–52.0)
Hemoglobin: 12.6 g/dL — ABNORMAL LOW (ref 13.0–17.0)
Immature Granulocytes: 1 %
Lymphocytes Relative: 35 %
Lymphs Abs: 1.4 10*3/uL (ref 0.7–4.0)
MCH: 32.8 pg (ref 26.0–34.0)
MCHC: 33 g/dL (ref 30.0–36.0)
MCV: 99.5 fL (ref 80.0–100.0)
Monocytes Absolute: 0.4 10*3/uL (ref 0.1–1.0)
Monocytes Relative: 10 %
Neutro Abs: 2.1 10*3/uL (ref 1.7–7.7)
Neutrophils Relative %: 51 %
Platelets: 139 10*3/uL — ABNORMAL LOW (ref 150–400)
RBC: 3.84 MIL/uL — ABNORMAL LOW (ref 4.22–5.81)
RDW: 13.7 % (ref 11.5–15.5)
WBC: 4 10*3/uL (ref 4.0–10.5)
nRBC: 0 % (ref 0.0–0.2)

## 2020-07-06 LAB — COMPREHENSIVE METABOLIC PANEL
ALT: 67 U/L — ABNORMAL HIGH (ref 0–44)
AST: 73 U/L — ABNORMAL HIGH (ref 15–41)
Albumin: 4 g/dL (ref 3.5–5.0)
Alkaline Phosphatase: 44 U/L (ref 38–126)
Anion gap: 13 (ref 5–15)
BUN: 7 mg/dL (ref 6–20)
CO2: 20 mmol/L — ABNORMAL LOW (ref 22–32)
Calcium: 9.3 mg/dL (ref 8.9–10.3)
Chloride: 102 mmol/L (ref 98–111)
Creatinine, Ser: 0.57 mg/dL — ABNORMAL LOW (ref 0.61–1.24)
GFR calc Af Amer: >60 mL/min (ref >60)
GFR calc non Af Amer: >60 mL/min (ref >60)
Glucose, Bld: 88 mg/dL (ref 70–99)
Potassium: 3.6 mmol/L (ref 3.5–5.1)
Sodium: 135 mmol/L (ref 135–145)
Total Bilirubin: 0.8 mg/dL (ref 0.3–1.2)
Total Protein: 7.2 g/dL (ref 6.5–8.1)

## 2020-07-06 LAB — MAGNESIUM: Magnesium: 2 mg/dL (ref 1.7–2.4)

## 2020-07-06 LAB — ETHANOL: Alcohol, Ethyl (B): 229 mg/dL — ABNORMAL HIGH (ref <10)

## 2020-07-06 LAB — LIPASE, BLOOD: Lipase: 512 U/L — ABNORMAL HIGH (ref 11–51)

## 2020-07-06 IMAGING — CT CT ABD-PELV W/ CM
2 of 4 series · 16 of 46 positions shown, 18 images · IV contrast (omnipaque)
Comparison: Abdominal ultrasound dated [DATE].

CLINICAL DATA: Abdominal pain, concern for pancreatitis.

EXAM:
CT ABDOMEN AND PELVIS WITH CONTRAST
TECHNIQUE: Multidetector CT imaging of the abdomen and pelvis was performed
using the standard protocol following bolus administration of
intravenous contrast.
CONTRAST:  100mL OMNIPAQUE IOHEXOL 300 MG/ML  SOLN

[Series 3: abdomen 5.0 · axial · 0.73mm/px · z∈[+947,+1352]mm · 13 of 93 slices shown, 15 images]
[im 6/93  soft-tissue]
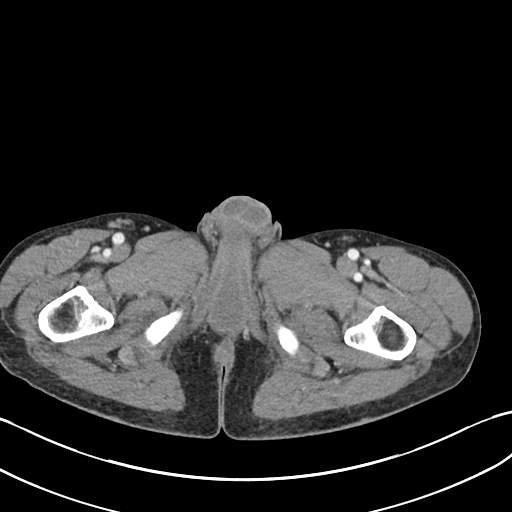
[im 6/93  bone]
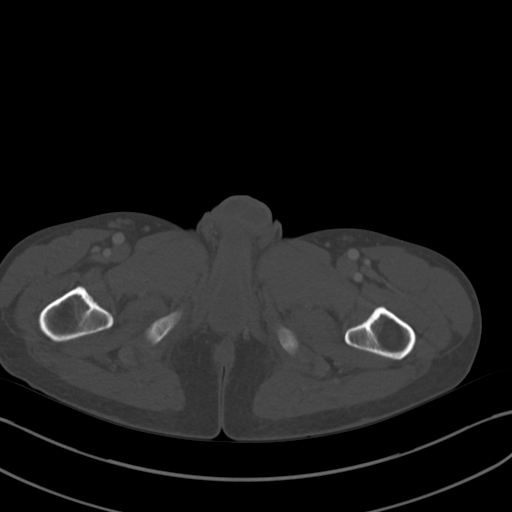
[im 11/93  soft-tissue]
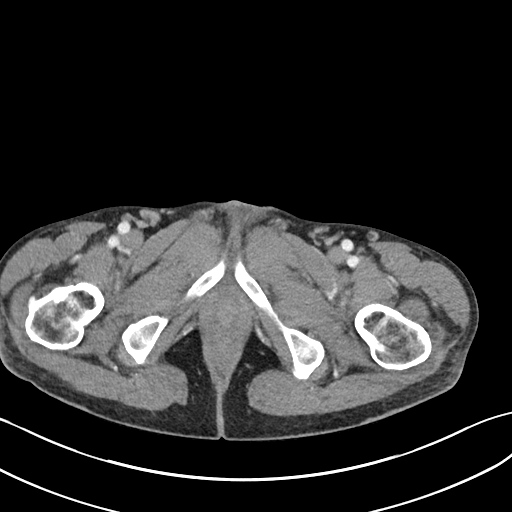
[im 21/93  soft-tissue]
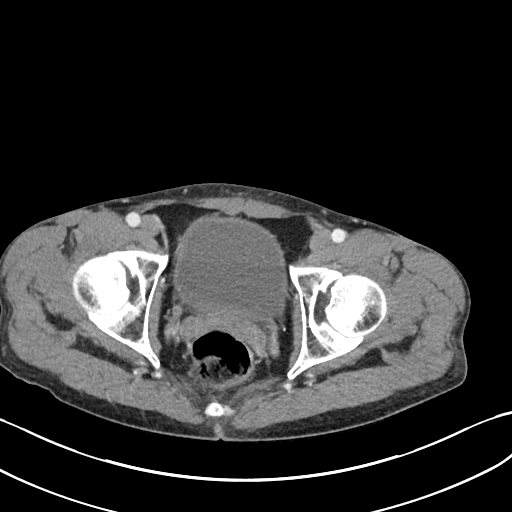
[im 26/93  soft-tissue]
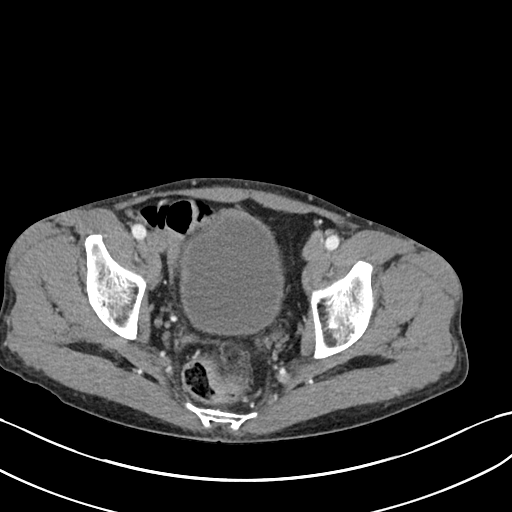
[im 31/93  soft-tissue]
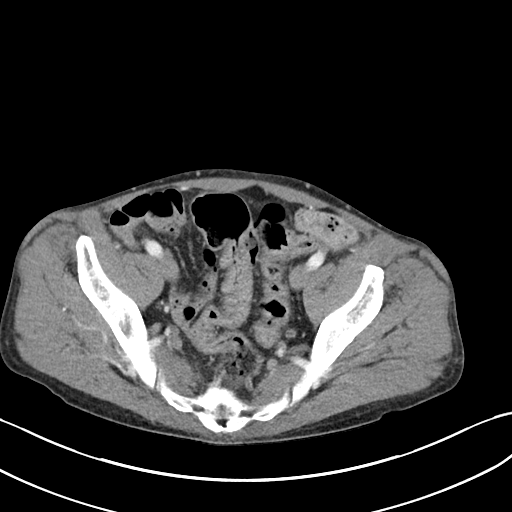
[im 41/93  soft-tissue]
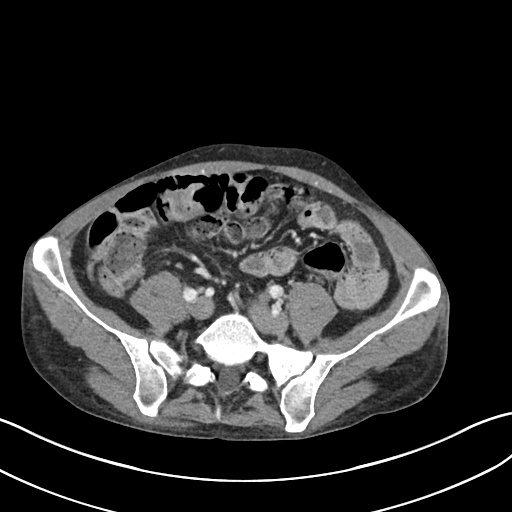
[im 47/93  soft-tissue]
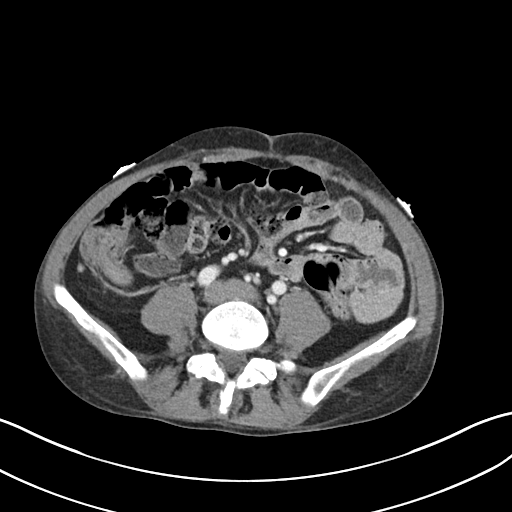
[im 52/93  soft-tissue]
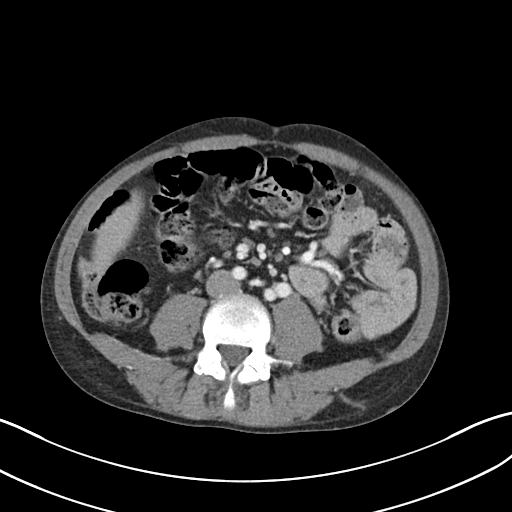
[im 62/93  soft-tissue]
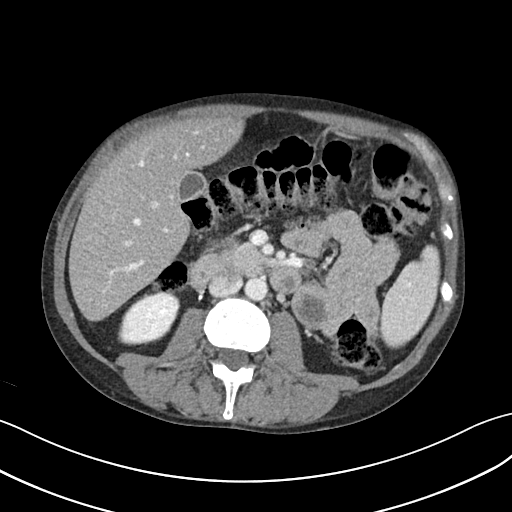
[im 62/93  bone]
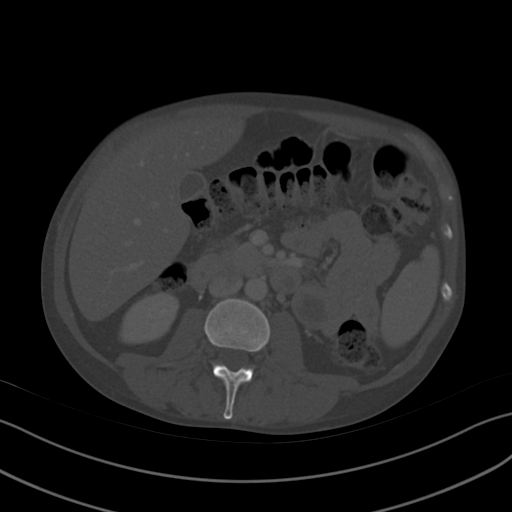
[im 67/93  soft-tissue]
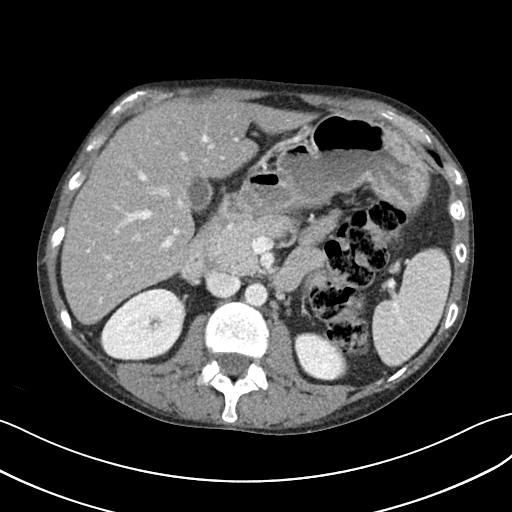
[im 72/93  soft-tissue]
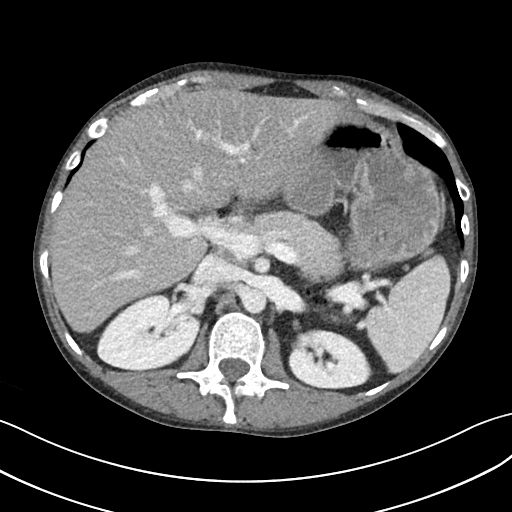
[im 82/93  soft-tissue]
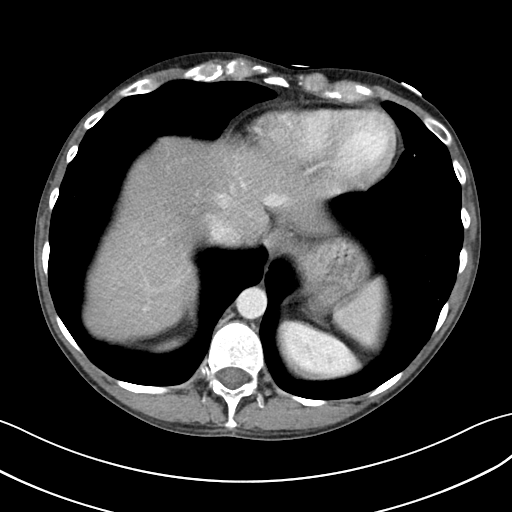
[im 87/93  soft-tissue]
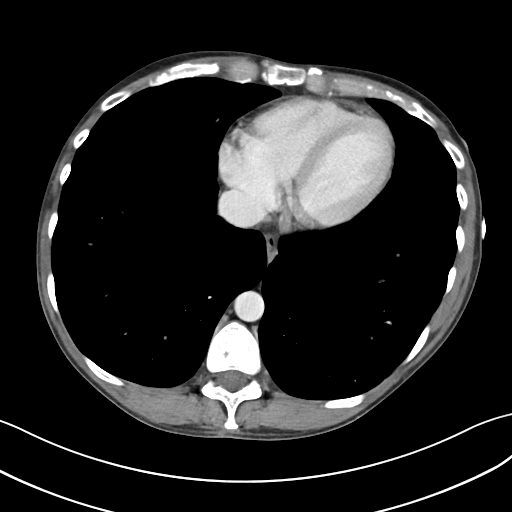

[Series 6: abdomen 3.0 mpr cor · coronal · 0.71mm/px · 3 of 106 slices shown]
[im 36/106  soft-tissue]
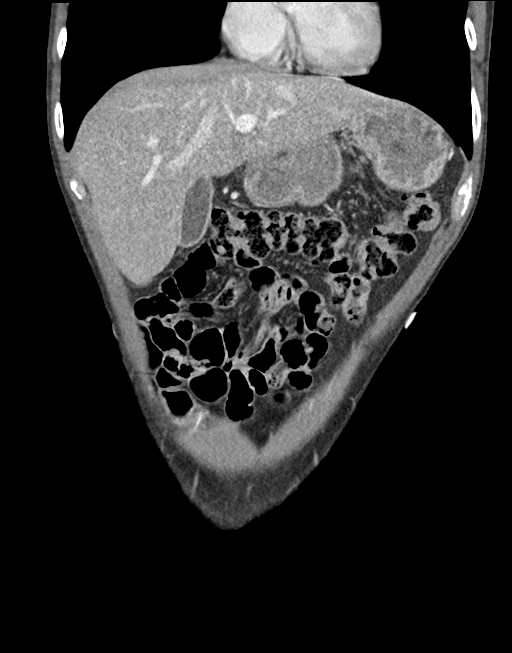
[im 47/106  soft-tissue]
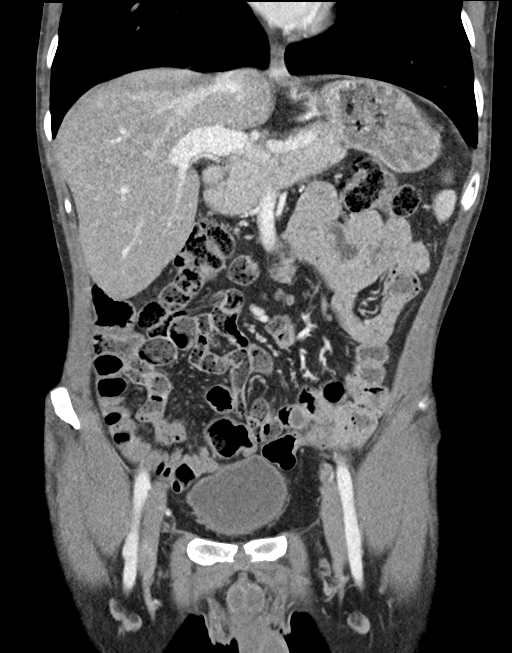
[im 59/106  soft-tissue]
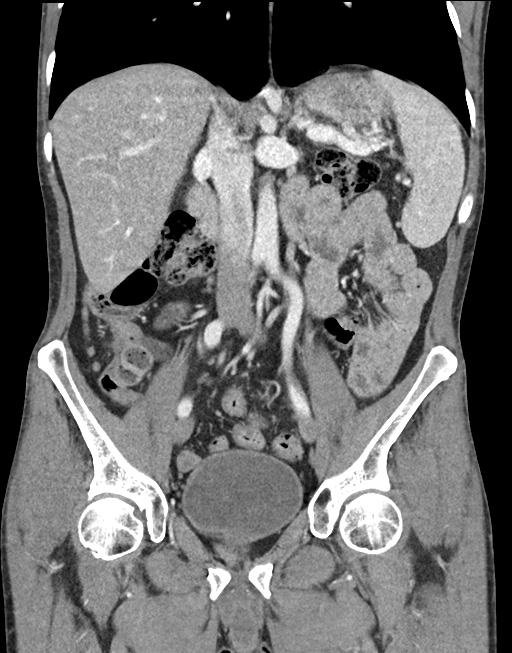

[16 of 46 positions shown; findings below may reference images not displayed]

FINDINGS: Lower chest: A 3 mm solid pulmonary nodule is seen in the left lower
lobe with surrounding ground-glass halo.

Hepatobiliary: The liver is hypoattenuating relative to the spleen,
consistent with hepatic steatosis. No focal liver abnormality is
seen. No gallstones, gallbladder wall thickening, or biliary
dilatation.

Pancreas: Unremarkable. No pancreatic ductal dilatation or
surrounding inflammatory changes.

Spleen: Normal in size without focal abnormality.

Adrenals/Urinary Tract: Adrenal glands are unremarkable. Other than
a 7 mm cyst in the right kidney, the kidneys are normal, without
renal calculi, focal lesion, or hydronephrosis. Bladder is
unremarkable.

Stomach/Bowel: Stomach is within normal limits. Appendix appears
normal. No evidence of bowel wall thickening, distention, or
inflammatory changes.

Vascular/Lymphatic: No significant vascular findings are present. No
enlarged abdominal or pelvic lymph nodes.

Reproductive: The prostate is mildly enlarged, measuring 5.0 cm in
transverse dimension.

Other: No abdominal wall hernia or abnormality. No abdominopelvic
ascites.

Musculoskeletal: No acute or significant osseous findings.
IMPRESSION: 1. No acute findings in the abdomen or pelvis.
2. Hepatic steatosis.
3. A 3 mm solid pulmonary nodule in the left lower lobe with
surrounding ground-glass halos is likely infectious or inflammatory.
Follow-up CT chest is recommended in 3-6 months to document
resolution.

## 2020-07-06 MED ORDER — IOHEXOL 300 MG/ML  SOLN
100.0000 mL | Freq: Once | INTRAMUSCULAR | Status: AC | PRN
  Administered 2020-07-06: 100 mL via INTRAVENOUS

## 2020-07-06 MED ORDER — ONDANSETRON HCL 4 MG/2ML IJ SOLN
4.0000 mg | Freq: Once | INTRAMUSCULAR | Status: AC
  Administered 2020-07-06: 4 mg via INTRAVENOUS
  Filled 2020-07-06: qty 2

## 2020-07-06 MED ORDER — LORAZEPAM 2 MG/ML IJ SOLN
1.0000 mg | Freq: Once | INTRAMUSCULAR | Status: AC
  Administered 2020-07-06: 1 mg via INTRAVENOUS
  Filled 2020-07-06: qty 1

## 2020-07-06 MED ORDER — SODIUM CHLORIDE 0.9 % IV BOLUS
1000.0000 mL | Freq: Once | INTRAVENOUS | Status: AC
  Administered 2020-07-06: 1000 mL via INTRAVENOUS

## 2020-07-06 NOTE — ED Triage Notes (Signed)
Pt in [employer] custody.  States he is detoxing from ETOH.  Last ETOH yesterday.  Also reports R knee pain x 1 week.

## 2020-07-06 NOTE — ED Provider Notes (Signed)
[facility] Note   CSN: [id number] Arrival date & time: 07/06/20  1402     History Chief Complaint  Patient presents with  . Alcohol Problem  . Knee Pain    Gary [name] is a 34 y.o. male.  Pt presents to the ED today with possible alcohol withdrawal.  Pt brought here by the police.  They were called to a building with a report of an intoxicated person.  They called EMS and vitals were ok.  He did not want to go to the hospital then.  He had a warrant, so the officer took him to jail.  When they arrived, the nurse was worried about dehydration and alcohol withdrawal.  [facility]  Pt said he did drink alcohol this am.  The officer said he ate a sandwich while he was waiting to come back.  He is hungry for more.        Past Medical History:  Diagnosis Date  . Alcohol abuse   . HIV (human immunodeficiency virus infection) (HCC)   . Subdural hematoma [facility](HCC)     Patient Active Problem List   Diagnosis Date Noted  . Alcohol abuse   . Alcohol poisoning   . Suicidal ideation 06/14/2020  . Pancreatitis 06/13/2020  . 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  . Malnutrition of moderate degree 05/07/2020  . Gastrointestinal hemorrhage   . Alcoholic hepatitis without ascites   . Melena 05/05/2020  . Fever 05/05/2020  . Alcoholic ketoacidosis 05/05/2020  . Alcohol withdrawal (HCC) 04/17/2020  . Thrombocytopenia (HCC) 07/25/2019  . Transaminitis 07/25/2019  . Diarrhea 07/25/2019  . Traumatic subdural hematoma (HCC) 07/02/2019  . Alcohol abuse with intoxication (HCC) 07/02/2019  . Alcohol abuse with alcohol-induced mood disorder (HCC) 05/13/2019  . Alcohol dependence (HCC) 04/16/2019  . HIV disease (HCC) 06/16/2018  . Polysubstance abuse (HCC) 06/16/2018  . Cigarette smoker 06/16/2018    Past Surgical History:  Procedure Laterality Date  . INCISION AND  DRAINAGE PERIRECTAL ABSCESS N/A 09/24/2016   Procedure: IRRIGATION AND DEBRIDEMENT PERIRECTAL ABSCESS;  Surgeon: Ricarda Frameharles Woodham, MD;  Location: [facility] ORS;  Service: General;  Laterality: N/A;  . none         Family History  Problem Relation Age of Onset  . Alcohol abuse Mother   . Alcohol abuse Father     Social History   Tobacco Use  . Smoking status: Former Smoker    Packs/day: 0.50    Types: Cigarettes  . Smokeless tobacco: Never Used  . Tobacco comment: unable to smoke while incarcerated 6+ months 02/13/20  Vaping Use  . Vaping Use: Never used  Substance Use Topics  . Alcohol use: Yes  . Drug use: Not Currently    Types: Marijuana, Methamphetamines    Comment: last used 04/10/2019    Home Medications Prior to Admission medications   Medication Sig Start Date End Date Taking? Authorizing Provider  chlordiazePOXIDE (LIBRIUM) 25 MG capsule 25 mg PO TID x 1 day, then 25 mg PO BID X 1 day,  then 25  PO QD X 1 day Patient not taking: Reported on 07/04/2020 06/29/20   Cathren LaineSteinl, Kevin, MD  elvitegravir-cobicistat-emtricitabine-tenofovir (GENVOYA) 150-150-200-10 MG TABS tablet Take 1 tablet by mouth daily with breakfast. 05/18/20   Linwood DibblesKnapp, Jon, MD  FLUoxetine (PROZAC) 20 MG capsule Take 1 capsule (20 mg total) by mouth daily. Patient not taking: Reported on 07/04/2020 06/19/20 07/19/20  Kendell Bane, MD  Multiple Vitamin (MULTIVITAMIN WITH MINERALS) TABS tablet Take 1 tablet by mouth daily. Patient not taking: Reported on 07/04/2020 06/19/20 07/19/20  Shahmehdi, Gemma Payor, MD  nicotine (NICODERM CQ - DOSED IN MG/24 HOURS) 21 mg/24hr patch Place 1 patch (21 mg total) onto the skin daily. Patient not taking: Reported on 06/07/2020 05/09/20   Burnadette Pop, MD  pantoprazole (PROTONIX) 40 MG tablet Take 1 tablet (40 mg total) by mouth daily. Patient not taking: Reported on 06/07/2020 05/09/20   Burnadette Pop, MD  sucralfate (CARAFATE) 1 GM/10ML suspension Take 10 mLs (1 g total) by mouth 4  (four) times daily -  with meals and at bedtime for 14 days. Patient not taking: Reported on 06/07/2020 05/09/20 06/07/20  Burnadette Pop, MD  famotidine (PEPCID) 20 MG tablet Take 1 tablet (20 mg total) by mouth 2 (two) times daily. Patient not taking: Reported on 06/01/2019 05/14/19 06/02/19  Benjiman Core, MD    Allergies    Tegretol [carbamazepine] and Caffeine  Review of Systems   Review of Systems  Neurological: Positive for weakness.  All other systems reviewed and are negative.   Physical Exam Updated Vital Signs BP 101/72 (BP Location: Left Arm)   Pulse 82   Temp 98.1 F (36.7 C) (Oral)   Resp 16   SpO2 100%   Physical Exam Vitals and nursing note reviewed.  Constitutional:      Appearance: Normal appearance.  HENT:     Head: Normocephalic and atraumatic.     Right Ear: External ear normal.     Left Ear: External ear normal.     Nose: Nose normal.     Mouth/Throat:     Mouth: Mucous membranes are dry.  Eyes:     Conjunctiva/sclera: Conjunctivae normal.     Pupils: Pupils are equal, round, and reactive to light.  Cardiovascular:     Rate and Rhythm: Normal rate and regular rhythm.     Pulses: Normal pulses.     Heart sounds: Normal heart sounds.  Pulmonary:     Effort: Pulmonary effort is normal.     Breath sounds: Normal breath sounds.  Abdominal:     General: Abdomen is flat. Bowel sounds are normal.     Palpations: Abdomen is soft.  Musculoskeletal:        General: Normal range of motion.     Cervical back: Normal range of motion and neck supple.  Skin:    General: Skin is warm.     Capillary Refill: Capillary refill takes less than 2 seconds.  Neurological:     General: No focal deficit present.     Mental Status: He is alert and oriented to person, place, and time.  Psychiatric:        Mood and Affect: Mood normal.        Behavior: Behavior normal.     ED Results / Procedures / Treatments   Labs (all labs ordered are listed, but only abnormal  results are displayed) Labs Reviewed  COMPREHENSIVE METABOLIC PANEL - Abnormal; Notable for the following components:      Result Value   CO2 20 (*)    Creatinine, Ser 0.57 (*)    AST 73 (*)    ALT 67 (*)    All other components within normal limits  ETHANOL - Abnormal; Notable for the following components:   Alcohol, Ethyl (B) 229 (*)    All other components within normal limits  CBC WITH DIFFERENTIAL/PLATELET - Abnormal;  Notable for the following components:   RBC 3.84 (*)    Hemoglobin 12.6 (*)    HCT 38.2 (*)    Platelets 139 (*)    All other components within normal limits  LIPASE, BLOOD - Abnormal; Notable for the following components:   Lipase 512 (*)    All other components within normal limits  MAGNESIUM    EKG EKG Interpretation  Date/Time:  Saturday July 06 2020 15:58:29 EDT Ventricular Rate:  74 PR Interval:  146 QRS Duration: 110 QT Interval:  418 QTC Calculation: 463 R Axis:   65 Text Interpretation: Normal sinus rhythm Nonspecific T wave abnormality Prolonged QT Abnormal ECG t wave inferiorally are new Confirmed by Jacalyn Lefevre [id number]) on 07/06/2020 4:53:18 PM   Radiology CT ABDOMEN PELVIS W CONTRAST  Result Date: 07/06/2020 CLINICAL DATA:  Abdominal pain, concern for pancreatitis. EXAM: CT ABDOMEN AND PELVIS WITH CONTRAST TECHNIQUE: Multidetector CT imaging of the abdomen and pelvis was performed using the standard protocol following bolus administration of intravenous contrast. CONTRAST:  OMNIPAQUE IOHEXOL 300 MG/ML  SOLN COMPARISON:  Abdominal ultrasound dated 06/13/2020. FINDINGS: Lower chest: A 3 mm solid pulmonary nodule is seen in the left lower lobe with surrounding ground-glass halo. Hepatobiliary: The liver is hypoattenuating relative to the spleen, consistent with hepatic steatosis. No focal liver abnormality is seen. No gallstones, gallbladder wall thickening, or biliary dilatation. Pancreas: Unremarkable. No pancreatic ductal dilatation or  surrounding inflammatory changes. Spleen: Normal in size without focal abnormality. Adrenals/Urinary Tract: Adrenal glands are unremarkable. Other than a 7 mm cyst in the right kidney, the kidneys are normal, without renal calculi, focal lesion, or hydronephrosis. Bladder is unremarkable. Stomach/Bowel: Stomach is within normal limits. Appendix appears normal. No evidence of bowel wall thickening, distention, or inflammatory changes. Vascular/Lymphatic: No significant vascular findings are present. No enlarged abdominal or pelvic lymph nodes. Reproductive: The prostate is mildly enlarged, measuring 5.0 cm in transverse dimension. Other: No abdominal wall hernia or abnormality. No abdominopelvic ascites. Musculoskeletal: No acute or significant osseous findings. IMPRESSION: 1. No acute findings in the abdomen or pelvis. 2. Hepatic steatosis. 3. A 3 mm solid pulmonary nodule in the left lower lobe with surrounding ground-glass halos is likely infectious or inflammatory. Follow-up CT chest is recommended in 3-6 months to document resolution. Electronically Signed   By: Romona Curls M.D.   On: 07/06/2020 19:03    Procedures Procedures (including critical care time)  Medications Ordered in ED Medications  sodium chloride 0.9 % bolus 1,000 mL (0 mLs Intravenous Stopped 07/06/20 1755)  ondansetron (ZOFRAN) injection 4 mg (4 mg Intravenous Given 07/06/20 1649)  LORazepam (ATIVAN) injection 1 mg (1 mg Intravenous Given 07/06/20 1648)  iohexol (OMNIPAQUE) 300 MG/ML solution 100 mL (100 mLs Intravenous Contrast Given 07/06/20 1839)    ED Course  I have reviewed the triage vital signs and the nursing notes.  Pertinent labs & imaging results that were available during my care of the patient were reviewed by me and considered in my medical decision making (see chart for details).    MDM Rules/Calculators/A&P                          Pt has shown no signs of alcohol withdrawal while here.  He has been eating  and drinking.  Lipase elevated, but CT ok.  He is stable to go back to jail.  Final Clinical Impression(s) / ED Diagnoses Final diagnoses:  Alcohol abuse  Rx / DC Orders ED Discharge Orders    None       Jacalyn Lefevre, MD 07/06/20 Windell Moment

## 2020-09-26 ENCOUNTER — Emergency Department
Admission: EM | Admit: 2020-09-26 | Discharge: 2020-09-27 | Disposition: A | Discharge status: Home / Self Care | Payer: Self-pay | Attending: Emergency Medicine | Admitting: Emergency Medicine

## 2020-09-26 DIAGNOSIS — F32A Depression, unspecified: Secondary | ICD-10-CM | POA: Insufficient documentation

## 2020-09-26 DIAGNOSIS — Z87891 Personal history of nicotine dependence: Secondary | ICD-10-CM | POA: Insufficient documentation

## 2020-09-26 DIAGNOSIS — F332 Major depressive disorder, recurrent severe without psychotic features: Secondary | ICD-10-CM | POA: Diagnosis not present

## 2020-09-26 DIAGNOSIS — Z21 Asymptomatic human immunodeficiency virus [HIV] infection status: Secondary | ICD-10-CM | POA: Diagnosis present

## 2020-09-26 DIAGNOSIS — F1014 Alcohol abuse with alcohol-induced mood disorder: Secondary | ICD-10-CM | POA: Diagnosis not present

## 2020-09-26 DIAGNOSIS — R45851 Suicidal ideations: Secondary | ICD-10-CM | POA: Insufficient documentation

## 2020-09-26 DIAGNOSIS — F10929 Alcohol use, unspecified with intoxication, unspecified: Secondary | ICD-10-CM

## 2020-09-26 DIAGNOSIS — F1094 Alcohol use, unspecified with alcohol-induced mood disorder: Secondary | ICD-10-CM | POA: Diagnosis present

## 2020-09-26 DIAGNOSIS — F10239 Alcohol dependence with withdrawal, unspecified: Secondary | ICD-10-CM | POA: Diagnosis present

## 2020-09-26 DIAGNOSIS — F10129 Alcohol abuse with intoxication, unspecified: Secondary | ICD-10-CM | POA: Insufficient documentation

## 2020-09-26 DIAGNOSIS — B20 Human immunodeficiency virus [HIV] disease: Secondary | ICD-10-CM | POA: Diagnosis present

## 2020-09-26 DIAGNOSIS — Z20822 Contact with and (suspected) exposure to covid-19: Secondary | ICD-10-CM | POA: Insufficient documentation

## 2020-09-26 LAB — CBC WITH DIFFERENTIAL/PLATELET
Abs Immature Granulocytes: 0.04 10*3/uL (ref 0.00–0.07)
Basophils Absolute: 0.1 10*3/uL (ref 0.0–0.1)
Basophils Relative: 1 %
Eosinophils Absolute: 0.3 10*3/uL (ref 0.0–0.5)
Eosinophils Relative: 2 %
HCT: 40 % (ref 39.0–52.0)
Hemoglobin: 13.7 g/dL (ref 13.0–17.0)
Immature Granulocytes: 0 %
Lymphocytes Relative: 33 %
Lymphs Abs: 3.6 10*3/uL (ref 0.7–4.0)
MCH: 33.4 pg (ref 26.0–34.0)
MCHC: 34.3 g/dL (ref 30.0–36.0)
MCV: 97.6 fL (ref 80.0–100.0)
Monocytes Absolute: 0.6 10*3/uL (ref 0.1–1.0)
Monocytes Relative: 5 %
Neutro Abs: 6.4 10*3/uL (ref 1.7–7.7)
Neutrophils Relative %: 59 %
Platelets: 211 10*3/uL (ref 150–400)
RBC: 4.1 MIL/uL — ABNORMAL LOW (ref 4.22–5.81)
RDW: 13 % (ref 11.5–15.5)
WBC: 10.9 10*3/uL — ABNORMAL HIGH (ref 4.0–10.5)
nRBC: 0 % (ref 0.0–0.2)

## 2020-09-26 LAB — RESP PANEL BY RT-PCR (FLU A&B, COVID) ARPGX2
Influenza A by PCR: NEGATIVE
Influenza B by PCR: NEGATIVE
SARS Coronavirus 2 by RT PCR: NEGATIVE

## 2020-09-26 LAB — URINE DRUG SCREEN, QUALITATIVE (ARMC ONLY)
Amphetamines, Ur Screen: NOT DETECTED
Barbiturates, Ur Screen: NOT DETECTED
Benzodiazepine, Ur Scrn: NOT DETECTED
Cannabinoid 50 Ng, Ur ~~LOC~~: NOT DETECTED
Cocaine Metabolite,Ur ~~LOC~~: NOT DETECTED
MDMA (Ecstasy)Ur Screen: NOT DETECTED
Methadone Scn, Ur: NOT DETECTED
Opiate, Ur Screen: NOT DETECTED
Phencyclidine (PCP) Ur S: NOT DETECTED
Tricyclic, Ur Screen: NOT DETECTED

## 2020-09-26 LAB — ETHANOL: Alcohol, Ethyl (B): 450 mg/dL (ref <10)

## 2020-09-26 LAB — COMPREHENSIVE METABOLIC PANEL
ALT: 18 U/L (ref 0–44)
AST: 27 U/L (ref 15–41)
Albumin: 4.3 g/dL (ref 3.5–5.0)
Alkaline Phosphatase: 67 U/L (ref 38–126)
Anion gap: 15 (ref 5–15)
BUN: 8 mg/dL (ref 6–20)
CO2: 26 mmol/L (ref 22–32)
Calcium: 8.4 mg/dL — ABNORMAL LOW (ref 8.9–10.3)
Chloride: 103 mmol/L (ref 98–111)
Creatinine, Ser: 0.62 mg/dL (ref 0.61–1.24)
GFR, Estimated: >60 mL/min (ref >60)
Glucose, Bld: 102 mg/dL — ABNORMAL HIGH (ref 70–99)
Potassium: 3.4 mmol/L — ABNORMAL LOW (ref 3.5–5.1)
Sodium: 144 mmol/L (ref 135–145)
Total Bilirubin: 0.6 mg/dL (ref 0.3–1.2)
Total Protein: 7.8 g/dL (ref 6.5–8.1)

## 2020-09-26 LAB — SALICYLATE LEVEL: Salicylate Lvl: <7 mg/dL — ABNORMAL LOW (ref 7.0–30.0)

## 2020-09-26 LAB — ACETAMINOPHEN LEVEL: Acetaminophen (Tylenol), Serum: <10 ug/mL — ABNORMAL LOW (ref 10–30)

## 2020-09-26 MED ORDER — LORAZEPAM 2 MG/ML IJ SOLN: 0.0000 mg | Freq: Two times a day (BID) | INTRAMUSCULAR | Status: DC

## 2020-09-26 MED ORDER — FLUOXETINE HCL 20 MG PO CAPS
20.0000 mg | ORAL_CAPSULE | Freq: Every day | ORAL | Status: DC
  Administered 2020-09-26 – 2020-09-27 (×2): 20 mg via ORAL
  Filled 2020-09-26 (×2): qty 1

## 2020-09-26 MED ORDER — LORAZEPAM 2 MG/ML IJ SOLN: 0.0000 mg | Freq: Four times a day (QID) | INTRAMUSCULAR | Status: DC

## 2020-09-26 MED ORDER — THIAMINE HCL 100 MG PO TABS
100.0000 mg | ORAL_TABLET | Freq: Every day | ORAL | Status: DC
  Administered 2020-09-26 – 2020-09-27 (×2): 100 mg via ORAL
  Filled 2020-09-26 (×2): qty 1

## 2020-09-26 MED ORDER — CHLORDIAZEPOXIDE HCL 25 MG PO CAPS
25.0000 mg | ORAL_CAPSULE | Freq: Once | ORAL | Status: AC
  Administered 2020-09-26: 25 mg via ORAL
  Filled 2020-09-26: qty 1

## 2020-09-26 MED ORDER — ELVITEG-COBIC-EMTRICIT-TENOFAF 150-150-200-10 MG PO TABS
1.0000 | ORAL_TABLET | Freq: Every day | ORAL | Status: DC
  Administered 2020-09-27: 1 via ORAL
  Filled 2020-09-26: qty 1

## 2020-09-26 MED ORDER — LORAZEPAM 2 MG PO TABS
0.0000 mg | ORAL_TABLET | Freq: Four times a day (QID) | ORAL | Status: DC
  Administered 2020-09-26: 2 mg via ORAL
  Administered 2020-09-26: 1 mg via ORAL
  Administered 2020-09-26 – 2020-09-27 (×3): 2 mg via ORAL
  Filled 2020-09-26 (×5): qty 1

## 2020-09-26 MED ORDER — ONDANSETRON 4 MG PO TBDP
4.0000 mg | ORAL_TABLET | Freq: Three times a day (TID) | ORAL | Status: DC | PRN
  Administered 2020-09-26: 4 mg via ORAL
  Filled 2020-09-26 (×4): qty 1

## 2020-09-26 MED ORDER — LORAZEPAM 2 MG/ML IJ SOLN: 2.0000 mg | Freq: Once | INTRAMUSCULAR | Status: DC

## 2020-09-26 MED ORDER — THIAMINE HCL 100 MG/ML IJ SOLN: 100.0000 mg | Freq: Every day | INTRAMUSCULAR | Status: DC

## 2020-09-26 MED ORDER — PANTOPRAZOLE SODIUM 40 MG PO TBEC
40.0000 mg | DELAYED_RELEASE_TABLET | Freq: Every day | ORAL | Status: DC
  Administered 2020-09-26 – 2020-09-27 (×2): 40 mg via ORAL
  Filled 2020-09-26: qty 1

## 2020-09-26 MED ORDER — LORAZEPAM 2 MG PO TABS: 0.0000 mg | ORAL_TABLET | Freq: Two times a day (BID) | ORAL | Status: DC

## 2020-09-26 MED ORDER — ONDANSETRON HCL 4 MG/2ML IJ SOLN: 4.0000 mg | Freq: Once | INTRAMUSCULAR | Status: DC

## 2020-09-26 NOTE — ED Notes (Signed)
Dr. Adaline Sill notified of patients severe tremors and nausea, this nurse administers ordered meds and prn to be ordered for nausea. Also informed of pt fever, MD states that we will continue to monitor pt of signs of alcohol withdrawal

## 2020-09-26 NOTE — ED Notes (Signed)
Hourly rounding completed at this time, patient currently asleep in room. No complaints, stable, and in no acute distress. Q15 minute rounds and monitoring via Security Cameras to continue. 

## 2020-09-26 NOTE — ED Notes (Signed)
Assumed acre of patient , patient cooperative with CIWA vital signs, was not willing to talk to nurse this morning, patient with eyes closed. Will re-assess when patient more awake

## 2020-09-26 NOTE — ED Notes (Signed)
Pt becoming agitated that he cannot "check out."  Dr. Toni Amend has spoken with patient and explained that he cannot leave at this time.

## 2020-09-26 NOTE — ED Notes (Signed)
Pt asleep at this time, unable to collect vitals. Will collect pt vitals once awake. 

## 2020-09-26 NOTE — ED Notes (Signed)
Patient provided with snack and beverage at this time.  ?

## 2020-09-26 NOTE — BH Assessment (Signed)
Assessment Note  Gary [name] is an 34 y.o. male who presents to the ER due to having thoughts of ending his life. Per the patient, he was trying to drink his self to death. Upon arrival to the ER his BAC was 450. His partner told him to come to the hospital to get help. He further reports, his depression has increased and now he no longer wants to live.  During the interview, the patient was calm, and attempted to participate but was difficult because of his alcohol use. During the time this writer was in the room with him, it was difficult for the patient to engage. He denies HI and AV/H and continues endorses SI.  Diagnosis: Major Depression  Past Medical History:  Past Medical History:  Diagnosis Date  . Alcohol abuse   . HIV (human immunodeficiency virus infection) (HCC)   . Subdural hematoma [facility])     Past Surgical History:  Procedure Laterality Date  . INCISION AND DRAINAGE PERIRECTAL ABSCESS N/A 09/24/2016   Procedure: IRRIGATION AND DEBRIDEMENT PERIRECTAL ABSCESS;  Surgeon: Ricarda Frame, MD;  Location: [facility] ORS;  Service: General;  Laterality: N/A;  . none      Family History:  Family History  Problem Relation Age of Onset  . Alcohol abuse Mother   . Alcohol abuse Father     Social History:  reports that he has quit smoking. His smoking use included cigarettes. He smoked 0.50 packs per day. He has never used smokeless tobacco. He reports current alcohol use. He reports previous drug use. Drugs: Marijuana and Methamphetamines.  Additional Social History:  Alcohol / Drug Use Pain Medications: See PTA Prescriptions: See PTA Over the Counter: See PTA History of alcohol / drug use?: Yes Longest period of sobriety (when/how long): Unable to quantify Substance #1 Name of Substance 1: Alcohol 1 - Last Use / Amount: 09/26/2020  CIWA: CIWA-Ar BP: 120/78 Pulse Rate: 98 Nausea and Vomiting: no nausea and no vomiting Tactile Disturbances: none Tremor: not visible, but  can be felt fingertip to fingertip Auditory Disturbances: not present Paroxysmal Sweats: barely perceptible sweating, palms moist Visual Disturbances: not present Anxiety: mildly anxious Headache, Fullness in Head: none present Agitation: normal activity Orientation and Clouding of Sensorium: oriented and can do serial additions CIWA-Ar Total: 3 COWS:    Allergies:  Allergies  Allergen Reactions  . Tegretol [Carbamazepine] Other (See Comments)    Caused vertigo for 2 days after taking it  . Caffeine Palpitations    Home Medications: (Not in a hospital admission)   OB/GYN Status:  No LMP for male patient.  General Assessment Data Location of Assessment: [facility] ED TTS Assessment: In system Is this a Tele or Face-to-Face Assessment?: Face-to-Face Is this an Initial Assessment or a Re-assessment for this encounter?: Initial Assessment Patient Accompanied by:: N/A Language Other than English: No Living Arrangements: Other (Comment) (Private Home) What gender do you identify as?: Male Date Telepsych consult ordered in [facility]: 09/26/20 Time Telepsych consult ordered in [facility]: 0728 Marital status: Long term relationship Pregnancy Status: No Living Arrangements: Non-relatives/Friends Can pt return to current living arrangement?: Yes Admission Status: Involuntary Petitioner: ED Attending Is patient capable of signing voluntary admission?: No (Under IVC) Referral Source: Self/Family/Friend Insurance type: None  Medical Screening Exam [facility] Walk-in ONLY) Medical Exam completed: Yes  Crisis Care Plan Living Arrangements: Non-relatives/Friends Legal Guardian: Other: (Self) Name of Psychiatrist: Reports of none Name of Therapist: Reports of none  Education Status Is patient currently in school?: No Is  the patient employed, unemployed or receiving disability?: Unemployed  Risk to self with the past 6 months Suicidal Ideation: Yes-Currently Present Has patient been a risk to self  within the past 6 months prior to admission? : Yes Suicidal Intent: Yes-Currently Present Has patient had any suicidal intent within the past 6 months prior to admission? : Yes Is patient at risk for suicide?: Yes Suicidal Plan?: Yes-Currently Present Has patient had any suicidal plan within the past 6 months prior to admission? : Yes Specify Current Suicidal Plan: "Drink myself to death." Access to Means: Yes Specify Access to Suicidal Means: Alcohol What has been your use of drugs/alcohol within the last 12 months?: Alcohol Previous Attempts/Gestures: Yes How many times?: 2 Other Self Harm Risks: Substance Abuse Triggers for Past Attempts: Spouse contact, Other (Comment) Intentional Self Injurious Behavior: None Family Suicide History: Unknown Recent stressful life event(s): Other (Comment) Persecutory voices/beliefs?: No Depression: Yes Depression Symptoms: Guilt, Tearfulness, Isolating Substance abuse history and/or treatment for substance abuse?: Yes Suicide prevention information given to non-admitted patients: Not applicable  Risk to Others within the past 6 months Homicidal Ideation: No Does patient have any lifetime risk of violence toward others beyond the six months prior to admission? : No Thoughts of Harm to Others: No Current Homicidal Intent: No Current Homicidal Plan: No Access to Homicidal Means: No Identified Victim: Reports of none History of harm to others?: No Assessment of Violence: None Noted Violent Behavior Description: Reports of none Does patient have access to weapons?: No Criminal Charges Pending?: No Does patient have a court date: No Is patient on probation?: No  Psychosis Hallucinations: None noted Delusions: None noted  Mental Status Report Appearance/Hygiene: Unremarkable, In scrubs Eye Contact: Poor Motor Activity: Unremarkable (Patient laying in the bed) Speech: Logical/coherent, Slurred, Soft Level of Consciousness: Other  (Comment) Mood: Depressed, Preoccupied, Pleasant Affect: Depressed, Sad Anxiety Level: None Thought Processes: Coherent, Relevant Judgement: Partial Orientation: Person, Place, Time, Situation, Appropriate for developmental age Obsessive Compulsive Thoughts/Behaviors: Moderate  Cognitive Functioning Concentration: Decreased Memory: Recent Intact, Remote Intact Is patient IDD: No Insight: Fair Impulse Control: Fair Appetite: Fair Have you had any weight changes? : Loss Sleep: Decreased Total Hours of Sleep: 5 Vegetative Symptoms: None  ADLScreening [facility] Assessment Services) Patient's cognitive ability adequate to safely complete daily activities?: Yes Patient able to express need for assistance with ADLs?: Yes Independently performs ADLs?: Yes (appropriate for developmental age)  Prior Inpatient Therapy Prior Inpatient Therapy: Yes Prior Therapy Dates: 04/2019 Prior Therapy Facilty/Provider(s): [facility] Reason for Treatment: Major Depression  Prior Outpatient Therapy Prior Outpatient Therapy: No Does patient have an ACCT team?: No Does patient have Intensive In-House Services?  : No Does patient have [facility] services? : No Does patient have P4CC services?: No  ADL Screening (condition at time of admission) Patient's cognitive ability adequate to safely complete daily activities?: Yes Is the patient deaf or have difficulty hearing?: No Does the patient have difficulty seeing, even when wearing glasses/contacts?: No Does the patient have difficulty concentrating, remembering, or making decisions?: No Patient able to express need for assistance with ADLs?: Yes Does the patient have difficulty dressing or bathing?: No Independently performs ADLs?: Yes (appropriate for developmental age) Does the patient have difficulty walking or climbing stairs?: No Weakness of Legs: None Weakness of Arms/Hands: None  Home Assistive Devices/Equipment Home Assistive Devices/Equipment:  None  Therapy Consults (therapy consults require a physician order) PT Evaluation Needed: No OT Evalulation Needed: No SLP Evaluation Needed: No Abuse/Neglect Assessment (Assessment  to be complete while patient is alone) Abuse/Neglect Assessment Can Be Completed: Yes Physical Abuse: Denies Verbal Abuse: Denies Sexual Abuse: Denies Exploitation of patient/patient's resources: Denies Self-Neglect: Denies Values / Beliefs Cultural Requests During Hospitalization: None Spiritual Requests During Hospitalization: None Consults Spiritual Care Consult Needed: No Transition of Care Team Consult Needed: No  Disposition:  Disposition Initial Assessment Completed for this Encounter: Yes  On Site Evaluation by:   Reviewed with Physician:    Lilyan Gilford MS, LCAS, [facility], NCC Therapeutic Triage Specialist 09/26/2020 12:33 PM

## 2020-09-26 NOTE — ED Notes (Signed)
Patient moved to BHU 4 report to amy Rn

## 2020-09-26 NOTE — ED Notes (Signed)
Lab contacted at this time to inform that orders were placed for blood that was collected by this nurse and tubed to lab by Dede Query. Jasmine expresses acknowledgment and that they will be processed.

## 2020-09-26 NOTE — ED Notes (Signed)
Hourly rounding completed at this time, patient currently awake in room. No complaints, stable, and in no acute distress. Q15 minute rounds and monitoring via Security Cameras to continue. 

## 2020-09-26 NOTE — ED Notes (Signed)
Report received from Amy, RN including  Situation, Background, Assessment, and Recommendations. Patient alert and oriented, warm and dry. Patient denies SI, HI, AVH and pain. Patient made aware of Q15 minute rounds and security cameras for their safety. Patient instructed to come to this nurse with needs or concerns.

## 2020-09-26 NOTE — ED Notes (Signed)
This nurse contacted lab again in reference to blood work. Jasmine states that lab "just received it and it should be on the instrument now."

## 2020-09-26 NOTE — ED Notes (Signed)
Hourly rounding completed at this time, patient currently awake in room. No complaints, stable, and in no acute distress. Q15 minute rounds and monitoring via Rover and Officer to continue. °

## 2020-09-26 NOTE — Consult Note (Signed)
[facility]   Reason for Consult:   Consult for 34 year old man with a history of alcohol abuse here in the hospital intoxicated and reporting suicidal ideation Referring Physician: Paduchowski Patient Identification: Parthenia Amesony P Sancho MRN:  [id number] Principal Diagnosis: Major depressive disorder, recurrent severe without psychotic features (HCC) Diagnosis:  Principal Problem:   Major depressive disorder, recurrent severe without psychotic features (HCC) Active Problems:   HIV disease (HCC)   Alcohol abuse with alcohol-induced mood disorder (HCC)   Alcohol withdrawal (HCC)   Total Time spent with patient: 1 hour  Subjective:   Parthenia Amesony P Waterbury is a 34 y.o. male patient admitted with "I was going to drink myself to death".  HPI: Patient seen chart reviewed.  34 year old man with a history of alcohol abuse here in the emergency room with a blood alcohol level almost 500.  On examination this morning patient was curled up in a near fetal position tremulous all over poor eye contact quiet limited speech.  York SpanielSaid that he has been drinking heavily as much as he can every day wine and liquor.  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  Mood is been very depressed.  Says he has suicidal thoughts of drinking himself to death.  Not eating well not sleeping well.  Occasional visual hallucinations.  Patient is limited in how much history he is offering.  Reports that he has been off his medicine including HIV medicine for just a few days.  Past Psychiatric History: Past history of several presentations to the hospital for intoxication and alcohol withdrawal.  He tells me he has had seizures and DTs in the past although I have not been able to find records of that.  Unclear if he is ever really participated in substance abuse treatment or maintained any sobriety.  Patient has a history of having left AGAINST MEDICAL  ADVICE and having short hospital stays.  Unclear if he is ever had any more comprehensive mental health treatment.  Risk to Self:   Risk to Others:   Prior Inpatient Therapy:   Prior Outpatient Therapy:    Past Medical History:  Past Medical History:  Diagnosis Date  . Alcohol abuse   . HIV (human immunodeficiency virus infection) (HCC)   . Subdural hematoma [facility](HCC)     Past Surgical History:  Procedure Laterality Date  . INCISION AND DRAINAGE PERIRECTAL ABSCESS N/A 09/24/2016   Procedure: IRRIGATION AND DEBRIDEMENT PERIRECTAL ABSCESS;  Surgeon: Ricarda Frameharles Woodham, MD;  Location: [facility] ORS;  Service: General;  Laterality: N/A;  . none     Family History:  Family History  Problem Relation Age of Onset  . Alcohol abuse Mother   . Alcohol abuse Father    Family Psychiatric  History: Positive for alcohol abuse in the family Social History:  Social History   Substance and Sexual Activity  Alcohol Use Yes     Social History   Substance and Sexual Activity  Drug Use Not Currently  . Types: Marijuana, Methamphetamines   Comment: last used 04/10/2019    Social History   Socioeconomic History  . Marital status: Single    Spouse name: Not on file  . Number of children: Not on file  . Years of education: Not on file  . Highest education level: Not on file  Occupational History  . Occupation: unemployed  Tobacco Use  . Smoking status: Former Smoker    Packs/day: 0.50  Types: Cigarettes  . Smokeless tobacco: Never Used  . Tobacco comment: unable to smoke while incarcerated 6+ months 02/13/20  Vaping Use  . Vaping Use: Never used  Substance and Sexual Activity  . Alcohol use: Yes  . Drug use: Not Currently    Types: Marijuana, Methamphetamines    Comment: last used 04/10/2019  . Sexual activity: Not Currently    Comment: unable to offer condoms while incarcerated  Other Topics Concern  . Not on file  Social History Narrative   "Currently living on the streets"    Independent at baseline   Social Determinants of Health   Financial Resource Strain:   . Difficulty of Paying Living Expenses: Not on file  Food Insecurity:   . Worried About Programme researcher, broadcasting/film/video in the Last Year: Not on file  . Ran Out of Food in the Last Year: Not on file  Transportation Needs:   . Lack of Transportation (Medical): Not on file  . Lack of Transportation (Non-Medical): Not on file  Physical Activity:   . Days of Exercise per Week: Not on file  . Minutes of Exercise per Session: Not on file  Stress:   . Feeling of Stress : Not on file  Social Connections:   . Frequency of Communication with Friends and Family: Not on file  . Frequency of Social Gatherings with Friends and Family: Not on file  . Attends Religious Services: Not on file  . Active Member of Clubs or Organizations: Not on file  . Attends Banker Meetings: Not on file  . Marital Status: Not on file   Additional Social History:    Allergies:   Allergies  Allergen Reactions  . Tegretol [Carbamazepine] Other (See Comments)    Caused vertigo for 2 days after taking it  . Caffeine Palpitations    Labs:  Results for orders placed or performed during the hospital encounter of 09/26/20 (from the past 48 hour(s))  CBC with Differential     Status: Abnormal   Collection Time: 09/26/20  2:21 AM  Result Value Ref Range   WBC 10.9 (H) 4.0 - 10.5 K/uL   RBC 4.10 (L) 4.22 - 5.81 MIL/uL   Hemoglobin 13.7 13.0 - 17.0 g/dL   HCT 91.4 39 - 52 %   MCV 97.6 80.0 - 100.0 fL   MCH 33.4 26.0 - 34.0 pg   MCHC 34.3 30.0 - 36.0 g/dL   RDW 78.2 95.6 - 21.3 %   Platelets 211 150 - 400 K/uL   nRBC 0.0 0.0 - 0.2 %   Neutrophils Relative % 59 %   Neutro Abs 6.4 1.7 - 7.7 K/uL   Lymphocytes Relative 33 %   Lymphs Abs 3.6 0.7 - 4.0 K/uL   Monocytes Relative 5 %   Monocytes Absolute 0.6 0.1 - 1.0 K/uL   Eosinophils Relative 2 %   Eosinophils Absolute 0.3 0.0 - 0.5 K/uL   Basophils Relative 1 %    Basophils Absolute 0.1 0.0 - 0.1 K/uL   Immature Granulocytes 0 %   Abs Immature Granulocytes 0.04 0.00 - 0.07 K/uL    Comment: Performed at [facility], [address] [place], [place] [address]  Comprehensive metabolic panel     Status: Abnormal   Collection Time: 09/26/20  2:21 AM  Result Value Ref Range   Sodium 144 135 - 145 mmol/L   Potassium 3.4 (L) 3.5 - 5.1 mmol/L   Chloride 103 98 - 111 mmol/L  CO2 26 22 - 32 mmol/L   Glucose, Bld 102 (H) 70 - 99 mg/dL    Comment: Glucose reference range applies only to samples taken after fasting for at least 8 hours.   BUN 8 6 - 20 mg/dL   Creatinine, Ser 1.61 0.61 - 1.24 mg/dL   Calcium 8.4 (L) 8.9 - 10.3 mg/dL   Total Protein 7.8 6.5 - 8.1 g/dL   Albumin 4.3 3.5 - 5.0 g/dL   AST 27 15 - 41 U/L   ALT 18 0 - 44 U/L   Alkaline Phosphatase 67 38 - 126 U/L   Total Bilirubin 0.6 0.3 - 1.2 mg/dL   GFR, Estimated >09 >60 mL/min    Comment: (NOTE) Calculated using the CKD-EPI Creatinine Equation (2021)    Anion gap 15 5 - 15    Comment: Performed at [facility], [address] [place], [place] [address]  Ethanol     Status: Abnormal   Collection Time: 09/26/20  2:21 AM  Result Value Ref Range   Alcohol, Ethyl (B) 450 (HH) <10 mg/dL    Comment: CRITICAL RESULT CALLED TO, READ BACK BY AND VERIFIED WITH CHRIS BUCKNER  ON 09/26/20 (NOTE) Lowest detectable limit for serum alcohol is 10 mg/dL.  For medical purposes only. Performed at [facility], [address] [place], [place] [address]   Salicylate level     Status: Abnormal   Collection Time: 09/26/20  2:21 AM  Result Value Ref Range   Salicylate Lvl <7.0 (L) 7.0 - 30.0 mg/dL    Comment: Performed at [facility], [address] [place], [place] [address]  Acetaminophen level     Status: Abnormal   Collection Time: 09/26/20  2:21 AM  Result Value Ref Range   Acetaminophen (Tylenol), Serum <10 (L) 10 - 30 ug/mL    Comment:  (NOTE) Therapeutic concentrations vary significantly. A range of 10-30 ug/mL  may be an effective concentration for many patients. However, some  are best treated at concentrations outside of this range. Acetaminophen concentrations >150 ug/mL at 4 hours after ingestion  and >50 ug/mL at 12 hours after ingestion are often associated with  toxic reactions.  Performed at [facility], [address] [place], [place] [address]   Urine Drug Screen, Qualitative [facility] only)     Status: None   Collection Time: 09/26/20  4:11 AM  Result Value Ref Range   Tricyclic, Ur Screen NONE DETECTED NONE DETECTED   Amphetamines, Ur Screen NONE DETECTED NONE DETECTED   MDMA (Ecstasy)Ur Screen NONE DETECTED NONE DETECTED   Cocaine Metabolite,Ur [place] NONE DETECTED NONE DETECTED   Opiate, Ur Screen NONE DETECTED NONE DETECTED   Phencyclidine (PCP) Ur S NONE DETECTED NONE DETECTED   Cannabinoid 50 Ng, Ur  NONE DETECTED NONE DETECTED   Barbiturates, Ur Screen NONE DETECTED NONE DETECTED   Benzodiazepine, Ur Scrn NONE DETECTED NONE DETECTED   Methadone Scn, Ur NONE DETECTED NONE DETECTED    Comment: (NOTE) Tricyclics + metabolites, urine    Cutoff 1000 ng/mL Amphetamines + metabolites, urine  Cutoff 1000 ng/mL MDMA (Ecstasy), urine              Cutoff 500 ng/mL Cocaine Metabolite, urine          Cutoff 300 ng/mL Opiate + metabolites, urine        Cutoff 300 ng/mL Phencyclidine (PCP), urine         Cutoff 25 ng/mL Cannabinoid, urine  Cutoff 50 ng/mL Barbiturates + metabolites, urine  Cutoff 200 ng/mL Benzodiazepine, urine              Cutoff 200 ng/mL Methadone, urine                   Cutoff 300 ng/mL  The urine drug screen provides only a preliminary, unconfirmed analytical test result and should not be used for non-medical purposes. Clinical consideration and professional judgment should be applied to any positive drug screen result due to possible interfering substances. A  more specific alternate chemical method must be used in order to obtain a confirmed analytical result. Gas chromatography / mass spectrometry (GC/MS) is the preferred confirm atory method. Performed at [facility], [address] [place], [place] [address]     Current Facility-Administered Medications  Medication Dose Route Frequency Provider Last Rate Last Admin  . [START ON 09/27/2020] elvitegravir-cobicistat-emtricitabine-tenofovir (GENVOYA) 150-150-200-10 MG tablet 1 tablet  1 tablet Oral Q breakfast Prerana Strayer T, MD      . FLUoxetine (PROZAC) capsule 20 mg  20 mg Oral Daily Rigdon Macomber T, MD      . LORazepam (ATIVAN) injection 0-4 mg  0-4 mg Intravenous Q6H Minna Antis, MD       Or  . LORazepam (ATIVAN) tablet 0-4 mg  0-4 mg Oral Q6H Minna Antis, MD   2 mg at 09/26/20 1009  . [START ON 09/28/2020] LORazepam (ATIVAN) injection 0-4 mg  0-4 mg Intravenous Q12H Minna Antis, MD       Or  . Melene Muller ON 09/28/2020] LORazepam (ATIVAN) tablet 0-4 mg  0-4 mg Oral Q12H Minna Antis, MD      . LORazepam (ATIVAN) injection 2 mg  2 mg Intramuscular Once Domonique Cothran T, MD      . ondansetron [facility]) injection 4 mg  4 mg Intramuscular Once Montrice Montuori T, MD      . pantoprazole (PROTONIX) EC tablet 40 mg  40 mg Oral Daily Kagen Kunath T, MD      . thiamine tablet 100 mg  100 mg Oral Daily Minna Antis, MD   100 mg at 09/26/20 1009   Or  . thiamine (B-1) injection 100 mg  100 mg Intravenous Daily Minna Antis, MD       Current Outpatient Medications  Medication Sig Dispense Refill  . chlordiazePOXIDE (LIBRIUM) 25 MG capsule 25 mg PO TID x 1 day, then 25 mg PO BID X 1 day,  then 25  PO QD X 1 day (Patient not taking: Reported on 07/04/2020) 6 capsule 0  . elvitegravir-cobicistat-emtricitabine-tenofovir (GENVOYA) 150-150-200-10 MG TABS tablet Take 1 tablet by mouth daily with breakfast. 30 tablet 1  . FLUoxetine (PROZAC) 20 MG capsule Take 1  capsule (20 mg total) by mouth daily. (Patient not taking: Reported on 07/04/2020) 30 capsule 3  . nicotine (NICODERM CQ - DOSED IN MG/24 HOURS) 21 mg/24hr patch Place 1 patch (21 mg total) onto the skin daily. (Patient not taking: Reported on 06/07/2020) 28 patch 0  . pantoprazole (PROTONIX) 40 MG tablet Take 1 tablet (40 mg total) by mouth daily. (Patient not taking: Reported on 06/07/2020) 30 tablet 1  . sucralfate (CARAFATE) 1 GM/10ML suspension Take 10 mLs (1 g total) by mouth 4 (four) times daily -  with meals and at bedtime for 14 days. (Patient not taking: Reported on 06/07/2020) 560 mL 0    Musculoskeletal: Strength & Muscle Tone: decreased Gait & Station: unable to stand Patient leans: N/A  Psychiatric Specialty Exam: Physical Exam Vitals and nursing note reviewed.  Constitutional:      Appearance: He is well-developed. He is ill-appearing and toxic-appearing.  HENT:     Head: Normocephalic and atraumatic.  Eyes:     Conjunctiva/sclera: Conjunctivae normal.     Pupils: Pupils are equal, round, and reactive to light.  Cardiovascular:     Heart sounds: Normal heart sounds.  Pulmonary:     Effort: Pulmonary effort is normal.  Abdominal:     Palpations: Abdomen is soft.  Musculoskeletal:        General: Normal range of motion.     Cervical back: Normal range of motion.  Skin:    General: Skin is warm and dry.  Neurological:     Mental Status: He is alert.     Coordination: Coordination abnormal.  Psychiatric:        Attention and Perception: He is inattentive.        Mood and Affect: Mood is anxious and depressed.        Speech: Speech is delayed.        Behavior: Behavior is withdrawn.        Thought Content: Thought content includes suicidal ideation. Thought content includes suicidal plan.        Cognition and Memory: Cognition is impaired. Memory is impaired.        Judgment: Judgment is impulsive.     Review of Systems  Constitutional: Positive for fatigue and  unexpected weight change.  HENT: Negative.   Eyes: Negative.   Respiratory: Negative.   Cardiovascular: Negative.   Gastrointestinal: Negative.   Musculoskeletal: Negative.   Skin: Negative.   Neurological: Negative.   Psychiatric/Behavioral: Positive for dysphoric mood and suicidal ideas.    Blood pressure 120/78, pulse 98, temperature 98.3 F (36.8 C), temperature source Oral, resp. rate 16, height  (1.88 m), weight 74.8 kg, SpO2 94 %.Body mass index is 21.18 kg/m.  General Appearance: Disheveled  Eye Contact:  Minimal  Speech:  Slow  Volume:  Decreased  Mood:  Anxious, Depressed and Dysphoric  Affect:  Constricted  Thought Process:  Coherent  Orientation:  Full (Time, Place, and Person)  Thought Content:  Rumination and Tangential  Suicidal Thoughts:  Yes.  with intent/plan  Homicidal Thoughts:  No  Memory:  Immediate;   Fair Recent;   Poor Remote;   Poor  Judgement:  Impaired  Insight:  Shallow  Psychomotor Activity:  Tremor  Concentration:  Concentration: Poor  Recall:  Fair  Fund of Knowledge:  Fair  Language:  Fair  Akathisia:  No  Handed:  Right  AIMS (if indicated):     Assets:  Desire for Improvement  ADL's:  Impaired  Cognition:  Impaired,  Mild  Sleep:        Treatment Plan Summary: Daily contact with patient to assess and evaluate symptoms and progress in treatment, Medication management and Plan   34 year old man with alcohol abuse also HIV positive.  Very intoxicated on presentation.  Tremulous all over.  Looks unhealthy like he has been losing weight.  Patient claims to have a history of DTs and seizures although I do not find clear documentation of that.  Detox orders are in place.  I ordered one-time doses of Ativan and Zofran for now for his acute distress.  Patient will need to be reassessed when his alcohol level comes down as to whether he needs psychiatric admission.  Continue IVC for now.  Reordered his HIV  medicines based on previous  notes.  Disposition: Recommend psychiatric Inpatient admission when medically cleared. Supportive therapy provided about ongoing stressors. Discussed crisis plan, support from social network, calling 911, coming to the Emergency Department, and calling Suicide Hotline.  Mordecai Rasmussen, MD 09/26/2020 11:02 AM

## 2020-09-26 NOTE — ED Notes (Signed)
RN to nurse's station to see what patient wanted. Pt grabbed this writer's name badge and headed to sally port door.  Pt able to get door open, but did not leave the unit.  RN took badge back from patient. Engineer, materials on unit to help RN get door closed.  Pt escorted back to dayroom.  Charge nurse and ED director made aware.

## 2020-09-26 NOTE — ED Notes (Signed)
Pt complains of chest tightness, Dr. Katrinka Blazing notified at this time.

## 2020-09-26 NOTE — ED Notes (Signed)
Pt reports HI toward, "anyone that crosses me." Pt will not take socks off when dressing out so left on at this time. Pt is partial toward security guard, Marvis Moeller who is able to communicate with pt and pt will follow requests. Pt is unsteady, staggering around and swaying due to being unbalanced. Pt is tearful, irritable, verbally aggressive, apologetic, and paranoid, changing from one to another. Pt states his boyfriend left him, will not state how much he has had to drink but states he is suicidal.

## 2020-09-26 NOTE — ED Notes (Signed)
Hourly rounding completed at this time, patient currently asleep in room. No complaints, stable, and in no acute distress. Q15 minute rounds and monitoring via Rover and Officer to continue. 

## 2020-09-26 NOTE — ED Notes (Signed)
EDP on unit to speak with patient. Additional medication will be ordered to help with pt detox.

## 2020-09-26 NOTE — ED Notes (Signed)
Patient swabbed for covid.

## 2020-09-26 NOTE — ED Notes (Signed)
Pt ambulatory to bathroom to urinate. Provided with urine cup and hat in toilet. Pt very unsteady gate. Pt knocked hat out of place and into bowl of toilet and did not urinate in cup for sample. After exiting bathroom pt stands in hallway and is resistant to return to room. Pt does return to room

## 2020-09-26 NOTE — ED Notes (Signed)
IVC  CONSULT  DONE  PENDING  PLACEMENT 

## 2020-09-26 NOTE — ED Notes (Signed)
Pt oriented to [employer] including Q15 minute rounds as well as Psychologist, counselling for their protection. Patient is alert and oriented, warm and dry in no acute distress. Patient denies VH and pain. Pt. Encouraged to let this nurse know if needs arise.

## 2020-09-26 NOTE — ED Triage Notes (Signed)
Pt comes via EMS from home due to consuming a large amount of alcohol following his significant other leaving him.   Arrives to ED intoxicated, slurring speech, unsteady gait. Pt is uncooperative with this nurse but will respond to security talking to pt.   When asked why pt was here, he states "suicide" and "mentally unstable." Pt asked how much alcohol he has had to drink and pt responds with, "however much your mother said I had."

## 2020-09-26 NOTE — ED Notes (Signed)
Dr. Toni Amend and TTS at bedside. Plan of care pending.

## 2020-09-26 NOTE — ED Provider Notes (Signed)
[facility] Emergency Department Provider Note  ____________________________________________  Time seen: Approximately 2:46 AM  I have reviewed the triage vital signs and the nursing notes.   HISTORY  Chief Complaint Alcohol Intoxication   HPI Gary [name] is a 34 y.o. male the history of HIV and alcohol abuse who presents voluntarily for suicidal thoughts.  Patient tells me that he drinks as much as he can every day.  He says he is trying to kill himself by alcohol.  He reports feeling extremely depressed.  Denies any other drug use.  Dors is compliance with his HIV medications.  Denies any medical complaints.   Past Medical History:  Diagnosis Date  . Alcohol abuse   . HIV (human immunodeficiency virus infection) (HCC)   . Subdural hematoma [facility])     Patient Active Problem List   Diagnosis Date Noted  . Alcohol abuse   . Alcohol poisoning   . Suicidal ideation 06/14/2020  . Pancreatitis 06/13/2020  . 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  . Malnutrition of moderate degree 05/07/2020  . Gastrointestinal hemorrhage   . Alcoholic hepatitis without ascites   . Melena 05/05/2020  . Fever 05/05/2020  . Alcoholic ketoacidosis 05/05/2020  . Alcohol withdrawal (HCC) 04/17/2020  . Thrombocytopenia (HCC) 07/25/2019  . Transaminitis 07/25/2019  . Diarrhea 07/25/2019  . Traumatic subdural hematoma (HCC) 07/02/2019  . Alcohol abuse with intoxication (HCC) 07/02/2019  . Alcohol abuse with alcohol-induced mood disorder (HCC) 05/13/2019  . Alcohol dependence (HCC) 04/16/2019  . HIV disease (HCC) 06/16/2018  . Polysubstance abuse (HCC) 06/16/2018  . Cigarette smoker 06/16/2018    Past Surgical History:  Procedure Laterality Date  . INCISION AND DRAINAGE PERIRECTAL ABSCESS N/A 09/24/2016   Procedure: IRRIGATION AND DEBRIDEMENT PERIRECTAL ABSCESS;  Surgeon: Ricarda Frame, MD;  Location: [facility] ORS;  Service: General;  Laterality: N/A;  .  none      Prior to Admission medications   Medication Sig Start Date End Date Taking? Authorizing Provider  chlordiazePOXIDE (LIBRIUM) 25 MG capsule 25 mg PO TID x 1 day, then 25 mg PO BID X 1 day,  then 25  PO QD X 1 day Patient not taking: Reported on 07/04/2020 06/29/20   Cathren Laine, MD  elvitegravir-cobicistat-emtricitabine-tenofovir (GENVOYA) 150-150-200-10 MG TABS tablet Take 1 tablet by mouth daily with breakfast. 05/18/20   Linwood Dibbles, MD  FLUoxetine (PROZAC) 20 MG capsule Take 1 capsule (20 mg total) by mouth daily. Patient not taking: Reported on 07/04/2020 06/19/20 07/19/20  Shahmehdi, Gemma Payor, MD  nicotine (NICODERM CQ - DOSED IN MG/24 HOURS) 21 mg/24hr patch Place 1 patch (21 mg total) onto the skin daily. Patient not taking: Reported on 06/07/2020 05/09/20   Burnadette Pop, MD  pantoprazole (PROTONIX) 40 MG tablet Take 1 tablet (40 mg total) by mouth daily. Patient not taking: Reported on 06/07/2020 05/09/20   Burnadette Pop, MD  sucralfate (CARAFATE) 1 GM/10ML suspension Take 10 mLs (1 g total) by mouth 4 (four) times daily -  with meals and at bedtime for 14 days. Patient not taking: Reported on 06/07/2020 05/09/20 06/07/20  Burnadette Pop, MD  famotidine (PEPCID) 20 MG tablet Take 1 tablet (20 mg total) by mouth 2 (two) times daily. Patient not taking: Reported on 06/01/2019 05/14/19 06/02/19  Benjiman Core, MD    Allergies Tegretol [carbamazepine] and Caffeine  Family History  Problem Relation Age of Onset  . Alcohol abuse Mother   . Alcohol abuse Father     Social History Social  History   Tobacco Use  . Smoking status: Former Smoker    Packs/day: 0.50    Types: Cigarettes  . Smokeless tobacco: Never Used  . Tobacco comment: unable to smoke while incarcerated 6+ months 02/13/20  Vaping Use  . Vaping Use: Never used  Substance Use Topics  . Alcohol use: Yes  . Drug use: Not Currently    Types: Marijuana, Methamphetamines    Comment: last used 04/10/2019    Review of  Systems  Constitutional: Negative for fever. Eyes: Negative for visual changes. ENT: Negative for sore throat. Neck: No neck pain  Cardiovascular: Negative for chest pain. Respiratory: Negative for shortness of breath. Gastrointestinal: Negative for abdominal pain, vomiting or diarrhea. Genitourinary: Negative for dysuria. Musculoskeletal: Negative for back pain. Skin: Negative for rash. Neurological: Negative for headaches, weakness or numbness. Psych: + depression and SI. No HI  ____________________________________________   PHYSICAL EXAM:  VITAL SIGNS: ED Triage Vitals  Enc Vitals Group     BP 09/26/20 0220 (!) 129/97     Pulse Rate 09/26/20 0220 (!) 101     Resp 09/26/20 0220 16     Temp 09/26/20 0220 98.1 F (36.7 C)     Temp Source 09/26/20 0220 Oral     SpO2 09/26/20 0220 100 %     Weight 09/26/20 0243 165 lb (74.8 kg)     Height 09/26/20 0243 6\' 2"  (1.88 m)     Head Circumference --      Peak Flow --      Pain Score 09/26/20 0242 0     Pain Loc --      Pain Edu? --      Excl. in GC? --     Constitutional: Alert and oriented, intoxicated.  HEENT:      Head: Normocephalic and atraumatic.         Eyes: Conjunctivae are normal. Sclera is non-icteric.       Mouth/Throat: Mucous membranes are moist.       Neck: Supple with no signs of meningismus. Cardiovascular: Regular rate and rhythm.  Respiratory: Normal respiratory effort.  Gastrointestinal: Soft, non tender, and non distended. Musculoskeletal: No edema, cyanosis, or erythema of extremities. Neurologic: Normal speech and language. Face is symmetric. Moving all extremities. No gross focal neurologic deficits are appreciated. Skin: Skin is warm, dry and intact. No rash noted. Psychiatric: Mood and affect are normal. Speech and behavior are normal.  ____________________________________________   LABS (all labs ordered are listed, but only abnormal results are displayed)  Labs Reviewed  CBC WITH  DIFFERENTIAL/PLATELET - Abnormal; Notable for the following components:      Result Value   WBC 10.9 (*)    RBC 4.10 (*)    All other components within normal limits  COMPREHENSIVE METABOLIC PANEL - Abnormal; Notable for the following components:   Potassium 3.4 (*)    Glucose, Bld 102 (*)    Calcium 8.4 (*)    All other components within normal limits  ETHANOL - Abnormal; Notable for the following components:   Alcohol, Ethyl (B) 450 (*)    All other components within normal limits  SALICYLATE LEVEL - Abnormal; Notable for the following components:   Salicylate Lvl <7.0 (*)    All other components within normal limits  ACETAMINOPHEN LEVEL - Abnormal; Notable for the following components:   Acetaminophen (Tylenol), Serum <10 (*)    All other components within normal limits  URINE DRUG SCREEN, QUALITATIVE ([facility] ONLY)   ____________________________________________  EKG  none  ____________________________________________  RADIOLOGY  none  ____________________________________________   PROCEDURES  Procedure(s) performed: None Procedures Critical Care performed:  None ____________________________________________   INITIAL IMPRESSION / ASSESSMENT AND PLAN / ED COURSE   34 y.o. male the history of HIV and alcohol abuse who presents voluntarily for suicidal thoughts.  Patient extremity intoxicated with suicidal thoughts.  Patient was placed under IVC.  Labs for medical clearance showing negative UDS, no significant electrolyte derangements, alcohol level 450.  Patient placed on CIWA protocol.  Psychiatry consulted.  Patient medically cleared.  The patient has been placed in psychiatric observation due to the need to provide a safe environment for the patient while obtaining psychiatric consultation and evaluation, as well as ongoing medical and medication management to treat the patient's condition.  The patient has been placed under full IVC at this time.       Please  note:  Patient was evaluated in Emergency Department today for the symptoms described in the history of present illness. Patient was evaluated in the context of the global COVID-19 pandemic, which necessitated consideration that the patient might be at risk for infection with the SARS-CoV-2 virus that causes COVID-19. Institutional protocols and algorithms that pertain to the evaluation of patients at risk for COVID-19 are in a state of rapid change based on information released by regulatory bodies including the CDC and federal and state organizations. These policies and algorithms were followed during the patient's care in the ED.  Some ED evaluations and interventions may be delayed as a result of limited staffing during the pandemic.   ____________________________________________   FINAL CLINICAL IMPRESSION(S) / ED DIAGNOSES   Final diagnoses:  Alcoholic intoxication with complication (HCC)  Suicidal ideation      NEW MEDICATIONS STARTED DURING THIS VISIT:  ED Discharge Orders    None       Note:  This document was prepared using Dragon voice recognition software and may include unintentional dictation errors.    Nita Sickle, MD 09/26/20 [phone]

## 2020-09-26 NOTE — ED Notes (Signed)
Hourly rounding completed at this time, patient currently awake in restroom. No complaints, stable, and in no acute distress. Q15 minute rounds and monitoring via Rover and Officer to continue. 

## 2020-09-26 NOTE — ED Notes (Addendum)
Pt. Is being dressed out by this tech and nurse chris and nurse kendall and also security officers [place], and [place]. Pt. Was provided with hospital attire scrubs (blue top and bottom). Pt. Belongings were bagged and labeled with Pt. Name.   Belongings Are: Gary [name] belt [employer] pair of shoes [employer] long sleeve stripped shirt Light blue ripped jeans Gray shirt  Green jacket 2 bags of 2 (Named and labeled)

## 2020-09-27 ENCOUNTER — Emergency Department: Payer: Self-pay

## 2020-09-27 ENCOUNTER — Emergency Department
Admission: EM | Admit: 2020-09-27 | Discharge: 2020-09-29 | Disposition: A | Discharge status: Home / Self Care | Payer: Self-pay | Attending: Emergency Medicine | Admitting: Emergency Medicine

## 2020-09-27 DIAGNOSIS — Z79899 Other long term (current) drug therapy: Secondary | ICD-10-CM | POA: Insufficient documentation

## 2020-09-27 DIAGNOSIS — R4689 Other symptoms and signs involving appearance and behavior: Secondary | ICD-10-CM

## 2020-09-27 DIAGNOSIS — F1094 Alcohol use, unspecified with alcohol-induced mood disorder: Secondary | ICD-10-CM | POA: Diagnosis present

## 2020-09-27 DIAGNOSIS — X58XXXA Exposure to other specified factors, initial encounter: Secondary | ICD-10-CM | POA: Insufficient documentation

## 2020-09-27 DIAGNOSIS — Z87891 Personal history of nicotine dependence: Secondary | ICD-10-CM | POA: Insufficient documentation

## 2020-09-27 DIAGNOSIS — F1010 Alcohol abuse, uncomplicated: Secondary | ICD-10-CM | POA: Insufficient documentation

## 2020-09-27 DIAGNOSIS — Z21 Asymptomatic human immunodeficiency virus [HIV] infection status: Secondary | ICD-10-CM | POA: Insufficient documentation

## 2020-09-27 DIAGNOSIS — F332 Major depressive disorder, recurrent severe without psychotic features: Secondary | ICD-10-CM | POA: Insufficient documentation

## 2020-09-27 DIAGNOSIS — S0181XA Laceration without foreign body of other part of head, initial encounter: Secondary | ICD-10-CM | POA: Insufficient documentation

## 2020-09-27 DIAGNOSIS — F919 Conduct disorder, unspecified: Secondary | ICD-10-CM | POA: Insufficient documentation

## 2020-09-27 DIAGNOSIS — F1014 Alcohol abuse with alcohol-induced mood disorder: Secondary | ICD-10-CM

## 2020-09-27 LAB — COMPREHENSIVE METABOLIC PANEL
ALT: 13 U/L (ref 0–44)
AST: 24 U/L (ref 15–41)
Albumin: 3.8 g/dL (ref 3.5–5.0)
Alkaline Phosphatase: 68 U/L (ref 38–126)
Anion gap: 15 (ref 5–15)
BUN: 7 mg/dL (ref 6–20)
CO2: 20 mmol/L — ABNORMAL LOW (ref 22–32)
Calcium: 8.7 mg/dL — ABNORMAL LOW (ref 8.9–10.3)
Chloride: 98 mmol/L (ref 98–111)
Creatinine, Ser: 0.66 mg/dL (ref 0.61–1.24)
GFR, Estimated: >60 mL/min (ref >60)
Glucose, Bld: 138 mg/dL — ABNORMAL HIGH (ref 70–99)
Potassium: 3.2 mmol/L — ABNORMAL LOW (ref 3.5–5.1)
Sodium: 133 mmol/L — ABNORMAL LOW (ref 135–145)
Total Bilirubin: 0.7 mg/dL (ref 0.3–1.2)
Total Protein: 7.3 g/dL (ref 6.5–8.1)

## 2020-09-27 LAB — URINE DRUG SCREEN, QUALITATIVE (ARMC ONLY)
Amphetamines, Ur Screen: NOT DETECTED
Barbiturates, Ur Screen: NOT DETECTED
Benzodiazepine, Ur Scrn: POSITIVE — AB
Cannabinoid 50 Ng, Ur ~~LOC~~: NOT DETECTED
Cocaine Metabolite,Ur ~~LOC~~: NOT DETECTED
MDMA (Ecstasy)Ur Screen: NOT DETECTED
Methadone Scn, Ur: NOT DETECTED
Opiate, Ur Screen: NOT DETECTED
Phencyclidine (PCP) Ur S: NOT DETECTED
Tricyclic, Ur Screen: NOT DETECTED

## 2020-09-27 LAB — CBC
HCT: 36.1 % — ABNORMAL LOW (ref 39.0–52.0)
Hemoglobin: 13 g/dL (ref 13.0–17.0)
MCH: 34 pg (ref 26.0–34.0)
MCHC: 36 g/dL (ref 30.0–36.0)
MCV: 94.5 fL (ref 80.0–100.0)
Platelets: 121 10*3/uL — ABNORMAL LOW (ref 150–400)
RBC: 3.82 MIL/uL — ABNORMAL LOW (ref 4.22–5.81)
RDW: 12.2 % (ref 11.5–15.5)
WBC: 6.8 10*3/uL (ref 4.0–10.5)
nRBC: 0 % (ref 0.0–0.2)

## 2020-09-27 LAB — ACETAMINOPHEN LEVEL: Acetaminophen (Tylenol), Serum: <10 ug/mL — ABNORMAL LOW (ref 10–30)

## 2020-09-27 LAB — ETHANOL: Alcohol, Ethyl (B): 323 mg/dL (ref <10)

## 2020-09-27 LAB — SALICYLATE LEVEL: Salicylate Lvl: <7 mg/dL — ABNORMAL LOW (ref 7.0–30.0)

## 2020-09-27 IMAGING — CT CT HEAD W/O CM
3 series · 15 of 47 positions shown, 18 images · non-contrast
Comparison: Head CT [DATE].

CLINICAL DATA: Altered mental status.

EXAM:
CT HEAD WITHOUT CONTRAST
TECHNIQUE: Contiguous axial images were obtained from the base of the skull
through the vertex without intravenous contrast.

[Series 2: head wo · axial · 0.47mm/px · z∈[-153,-13]mm · 9 of 34 slices shown, 12 images]
[im 3/34  brain]
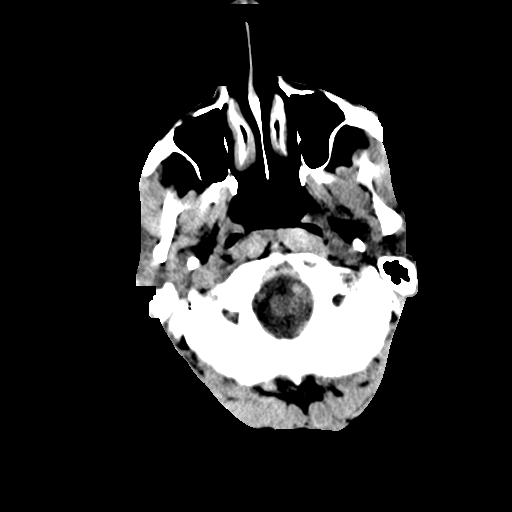
[im 3/34  bone]
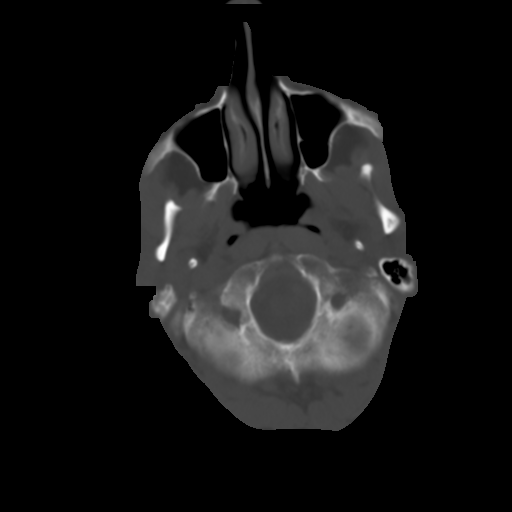
[im 6/34  brain]
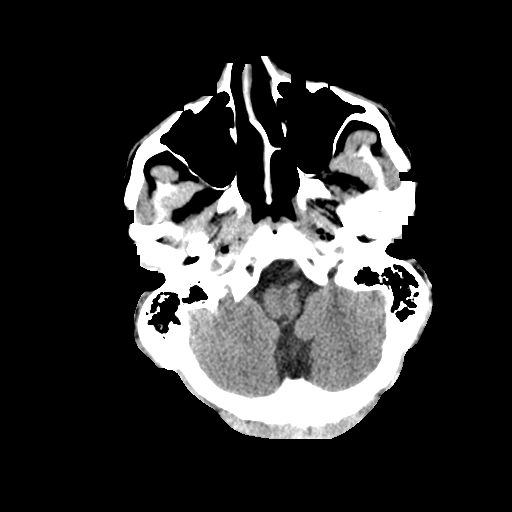
[im 10/34  brain]
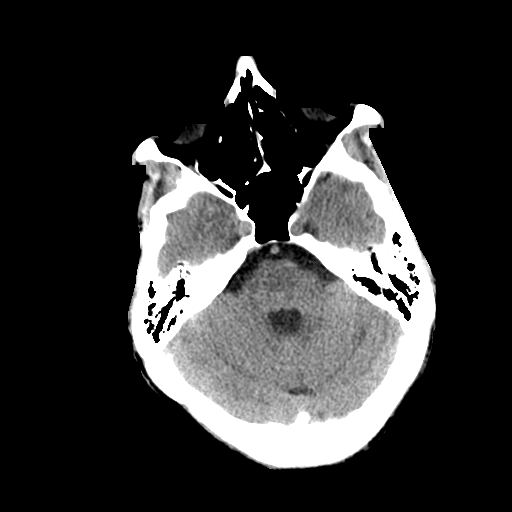
[im 13/34  brain]
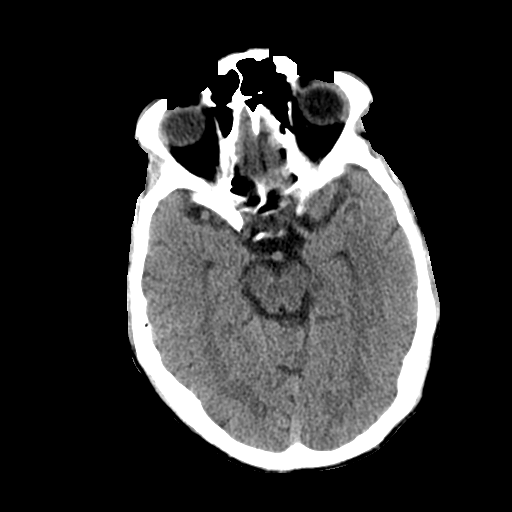
[im 18/34  brain]
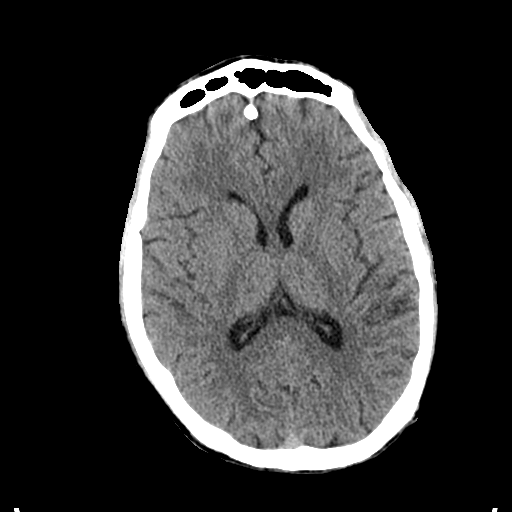
[im 18/34  bone]
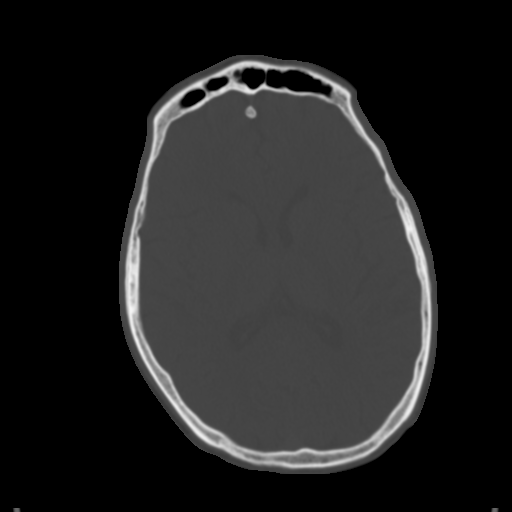
[im 21/34  brain]
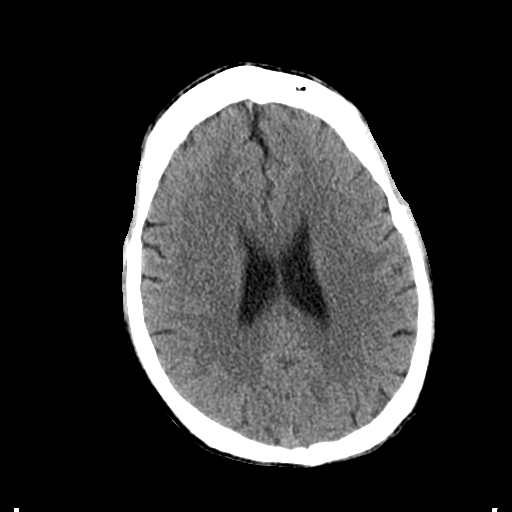
[im 24/34  brain]
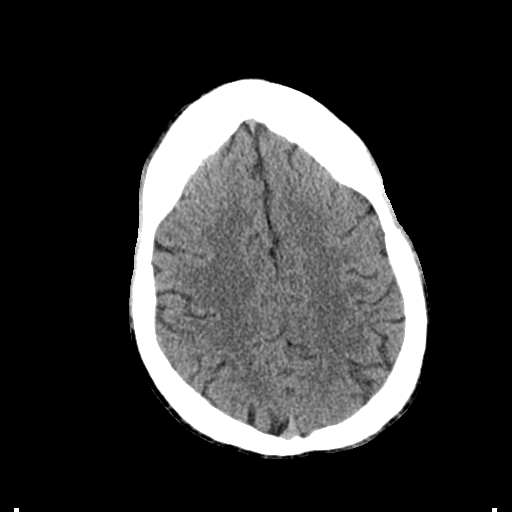
[im 28/34  brain]
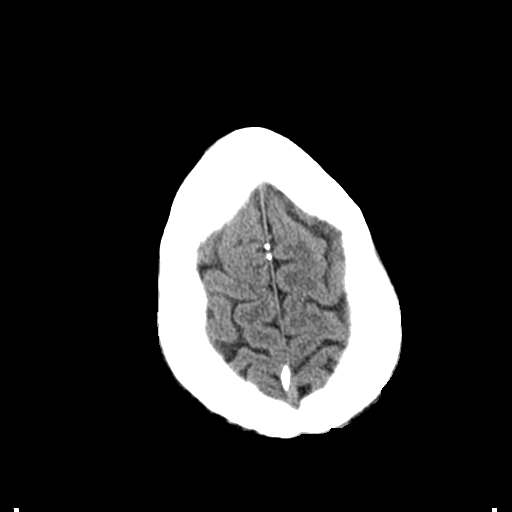
[im 31/34  brain]
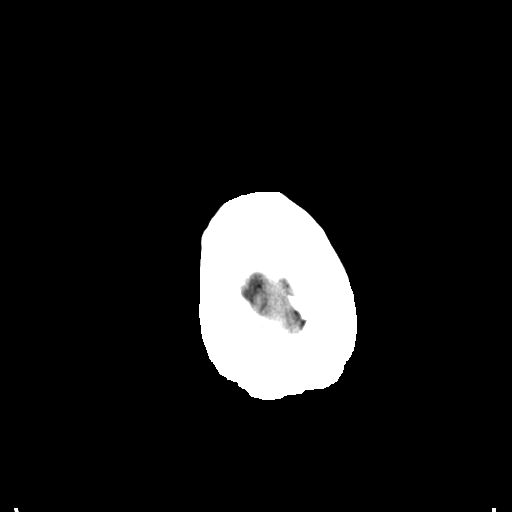
[im 31/34  bone]
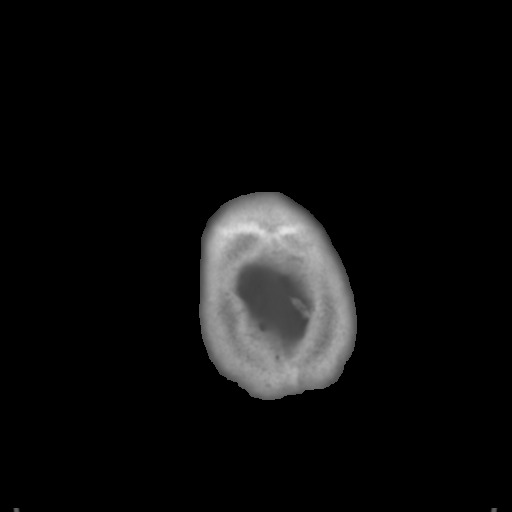

[Series 4: coronal soft tissue · coronal · 0.31mm/px · 3 of 74 slices shown]
[im 26/74  brain]
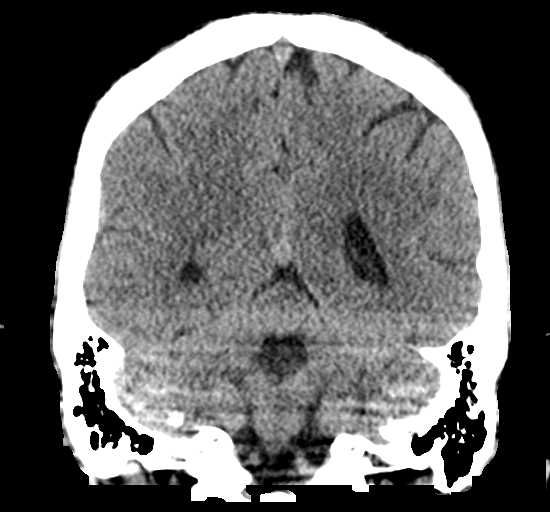
[im 33/74  brain]
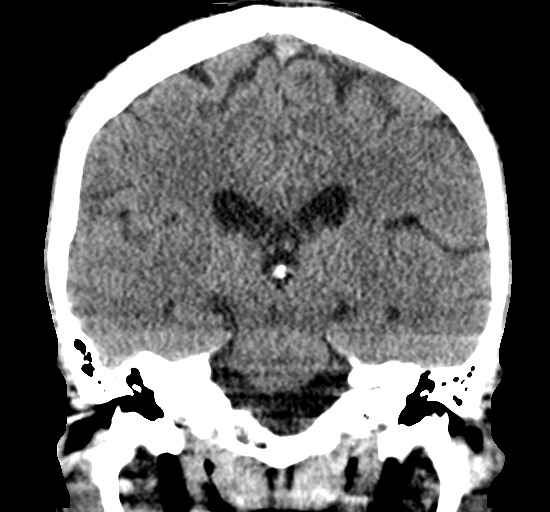
[im 41/74  brain]
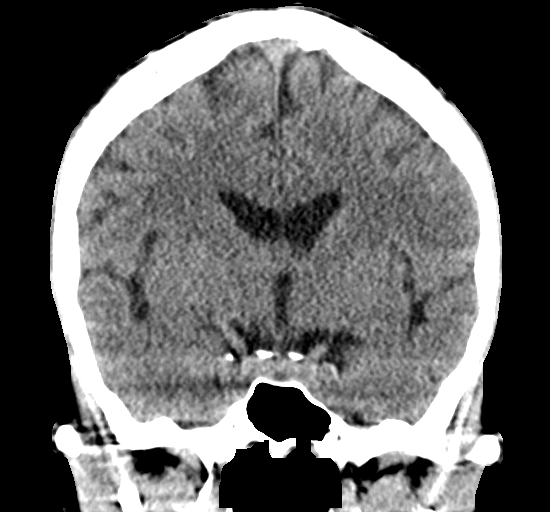

[Series 5: sagittal soft tissue · sagittal · 0.31mm/px · 3 of 55 slices shown]
[im 19/55  brain]
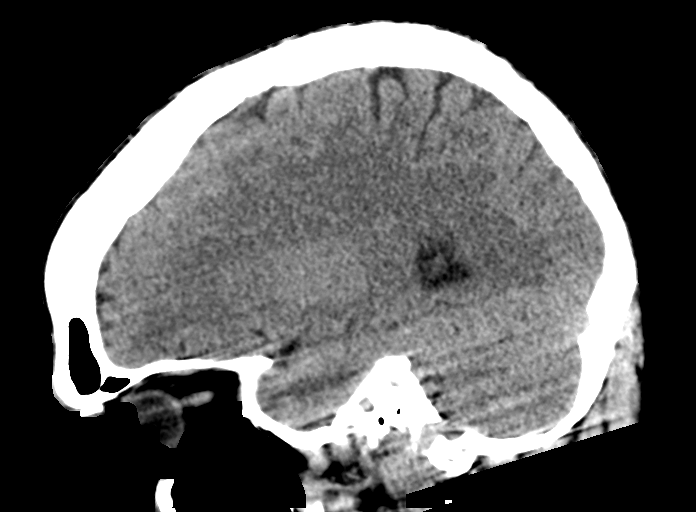
[im 28/55  brain]
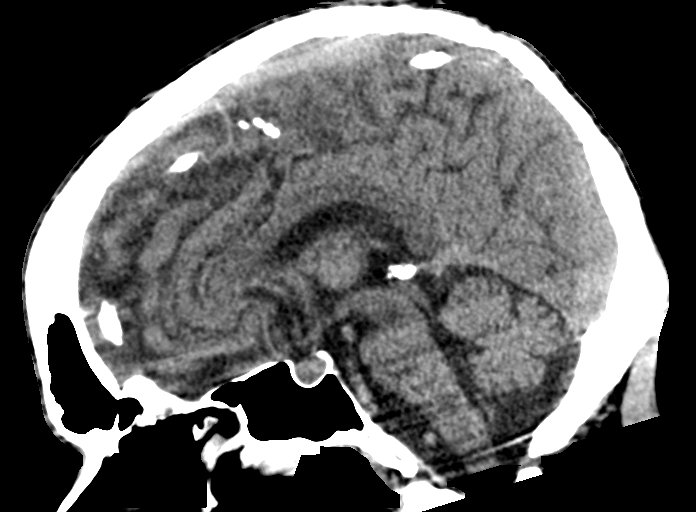
[im 37/55  brain]
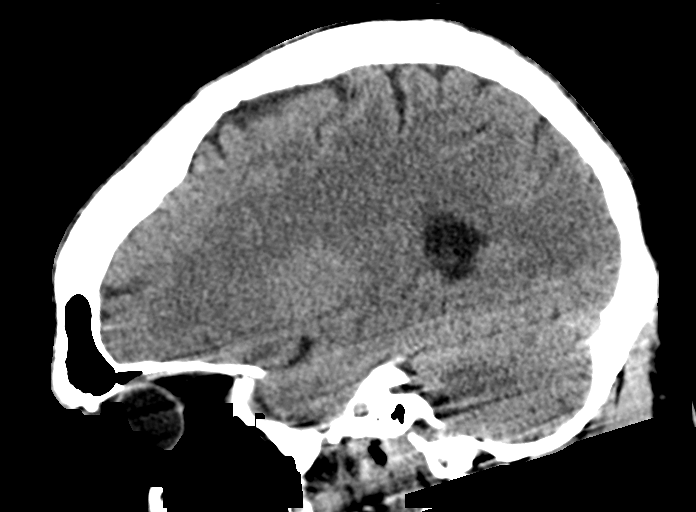

[15 of 47 positions shown; findings below may reference images not displayed]

FINDINGS: Brain:

Mild generalized cerebral atrophy, advanced for age.

There is no acute intracranial hemorrhage.

No demarcated cortical infarct.

No extra-axial fluid collection.

No evidence of intracranial mass.

No midline shift.

Vascular: No hyperdense vessel.

Skull: Normal. Negative for fracture or focal lesion.

Sinuses/Orbits: Visualized orbits show no acute finding. Minimal
ethmoid sinus mucosal thickening.
IMPRESSION: No evidence of acute intracranial abnormality.

Mild cerebral atrophy, advanced for age.

## 2020-09-27 MED ORDER — ELVITEG-COBIC-EMTRICIT-TENOFAF 150-150-200-10 MG PO TABS: 1.0000 | ORAL_TABLET | Freq: Every day | ORAL | 0 refills | Status: DC

## 2020-09-27 MED ORDER — PANTOPRAZOLE SODIUM 40 MG PO TBEC: 40.0000 mg | DELAYED_RELEASE_TABLET | Freq: Every day | ORAL | 0 refills | Status: DC

## 2020-09-27 MED ORDER — FLUOXETINE HCL 20 MG PO CAPS: 20.0000 mg | ORAL_CAPSULE | Freq: Every day | ORAL | 0 refills | Status: DC

## 2020-09-27 NOTE — ED Notes (Signed)
Hourly rounding completed at this time, patient currently asleep in room. No complaints, stable, and in no acute distress. Q15 minute rounds and monitoring via Security Cameras to continue. 

## 2020-09-27 NOTE — BH Assessment (Addendum)
Assessment Note  Gary [name] is an 34 y.o. male.  Patient unable to participate in interview. Currently too impaired. Diagnosis: Alcohol Use Disorder  Past Medical History:  Past Medical History:  Diagnosis Date  . Alcohol abuse   . HIV (human immunodeficiency virus infection) (HCC)   . Subdural hematoma [facility])     Past Surgical History:  Procedure Laterality Date  . INCISION AND DRAINAGE PERIRECTAL ABSCESS N/A 09/24/2016   Procedure: IRRIGATION AND DEBRIDEMENT PERIRECTAL ABSCESS;  Surgeon: Ricarda Frame, MD;  Location: [facility] ORS;  Service: General;  Laterality: N/A;  . none      Family History:  Family History  Problem Relation Age of Onset  . Alcohol abuse Mother   . Alcohol abuse Father     Social History:  reports that he has quit smoking. His smoking use included cigarettes. He smoked 0.50 packs per day. He has never used smokeless tobacco. He reports current alcohol use. He reports previous drug use. Drugs: Marijuana and Methamphetamines.  Additional Social History:     CIWA: CIWA-Ar BP: 131/82 Pulse Rate: 99 COWS:    Allergies:  Allergies  Allergen Reactions  . Tegretol [Carbamazepine] Other (See Comments)    Caused vertigo for 2 days after taking it  . Caffeine Palpitations    Home Medications: (Not in a hospital admission)   OB/GYN Status:  No LMP for male patient.  General Assessment Data Assessment unable to be completed: Yes Reason for Not Completing Assessment: Patient unable to participate in interview. Currently too impaired.   Disposition:     On Site Evaluation by:   Reviewed with Physician:    Clerance Lav, MS, LCAS-A 09/27/2020 6:29 PM

## 2020-09-27 NOTE — ED Notes (Signed)
Pt awake and ambulates to toilet at this time

## 2020-09-27 NOTE — ED Provider Notes (Signed)
[facility] Emergency Department Provider Note   ____________________________________________   I have reviewed the triage vital signs and the nursing notes.   HISTORY  Chief Complaint Psychiatric Evaluation   History limited by and level 5 caveat due to: Sedation   HPI Gary [name] is a 34 y.o. male who presents to the emergency department today because of concern for psychiatric illness.  The patient apparently was in the store and urinated on himself and the rest of the store. He did receive 5mg  haldol by EMS. The patient was discharged from the hospital earlier today after having been evaluated by psychiatry. On my exam patient is quite sedated, likely secondary to haldol.    Records reviewed. Per medical record review patient has a history of alcohol abuse, substance induced mood disorder. tdap 09/19/2019  Past Medical History:  Diagnosis Date  . Alcohol abuse   . HIV (human immunodeficiency virus infection) (HCC)   . Subdural hematoma [facility])     Patient Active Problem List   Diagnosis Date Noted  . Alcohol abuse   . Alcohol poisoning   . Suicidal ideation 06/14/2020  . Pancreatitis 06/13/2020  . 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  . Malnutrition of moderate degree 05/07/2020  . Gastrointestinal hemorrhage   . Alcoholic hepatitis without ascites   . Melena 05/05/2020  . Fever 05/05/2020  . Alcoholic ketoacidosis 05/05/2020  . Alcohol withdrawal (HCC) 04/17/2020  . Thrombocytopenia (HCC) 07/25/2019  . Transaminitis 07/25/2019  . Diarrhea 07/25/2019  . Traumatic subdural hematoma (HCC) 07/02/2019  . Alcohol abuse with intoxication (HCC) 07/02/2019  . Alcohol abuse with alcohol-induced mood disorder (HCC) 05/13/2019  . Alcohol dependence (HCC) 04/16/2019  . 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 HIV disease (HCC) 06/16/2018  . Polysubstance abuse (HCC) 06/16/2018  .  Cigarette smoker 06/16/2018    Past Surgical History:  Procedure Laterality Date  . INCISION AND DRAINAGE PERIRECTAL ABSCESS N/A 09/24/2016   Procedure: IRRIGATION AND DEBRIDEMENT PERIRECTAL ABSCESS;  Surgeon: 09/26/2016, MD;  Location: [facility] ORS;  Service: General;  Laterality: N/A;  . none      Prior to Admission medications   Medication Sig Start Date End Date Taking? Authorizing Provider  chlordiazePOXIDE (LIBRIUM) 25 MG capsule 25 mg PO TID x 1 day, then 25 mg PO BID X 1 day,  then 25  PO QD X 1 day Patient not taking: Reported on 07/04/2020 06/29/20   07/01/20, MD  elvitegravir-cobicistat-emtricitabine-tenofovir (GENVOYA) 150-150-200-10 MG TABS tablet Take 1 tablet by mouth daily with breakfast. 09/28/20   Clapacs, 09/30/20, MD  FLUoxetine (PROZAC) 20 MG capsule Take 1 capsule (20 mg total) by mouth daily. 09/27/20 10/27/20  Clapacs, 10/29/20, MD  nicotine (NICODERM CQ - DOSED IN MG/24 HOURS) 21 mg/24hr patch Place 1 patch (21 mg total) onto the skin daily. Patient not taking: Reported on 06/07/2020 05/09/20   07/10/20, MD  pantoprazole (PROTONIX) 40 MG tablet Take 1 tablet (40 mg total) by mouth daily. 09/27/20   Clapacs, 09/29/20, MD  sucralfate (CARAFATE) 1 GM/10ML suspension Take 10 mLs (1 g total) by mouth 4 (four) times daily -  with meals and at bedtime for 14 days. Patient not taking: Reported on 06/07/2020 05/09/20 06/07/20  06/09/20, MD  famotidine (PEPCID) 20 MG tablet Take 1 tablet (20 mg total) by mouth 2 (two) times daily. Patient not taking: Reported on 06/01/2019 05/14/19 06/02/19  06/04/19, MD    Allergies  Tegretol [carbamazepine] and Caffeine  Family History  Problem Relation Age of Onset  . Alcohol abuse Mother   . Alcohol abuse Father     Social History Social History   Tobacco Use  . Smoking status: Former Smoker    Packs/day: 0.50    Types: Cigarettes  . Smokeless tobacco: Never Used  . Tobacco comment: unable to smoke while  incarcerated 6+ months 02/13/20  Vaping Use  . Vaping Use: Never used  Substance Use Topics  . Alcohol use: Yes  . Drug use: Not Currently    Types: Marijuana, Methamphetamines    Comment: last used 04/10/2019    Review of Systems Unable to obtain reliable ROS secondary to sedation. ____________________________________________   PHYSICAL EXAM:  VITAL SIGNS: ED Triage Vitals  Enc Vitals Group     BP 09/27/20 1616 131/82     Pulse Rate 09/27/20 1616 99     Resp 09/27/20 1616 18     Temp 09/27/20 1616 98.2 F (36.8 C)     Temp Source 09/27/20 1616 Oral     SpO2 09/27/20 1616 99 %     Weight --      Height --      Head Circumference --      Peak Flow --      Pain Score 09/27/20 1617 0   Constitutional: Somnolent.  Eyes: Conjunctivae are normal.  ENT      Head: Normocephalic.      Nose: No congestion/rhinnorhea.      Mouth/Throat: Mucous membranes are moist.      Neck: No stridor. Hematological/Lymphatic/Immunilogical: No cervical lymphadenopathy. Cardiovascular: Normal rate, regular rhythm.  No murmurs, rubs, or gallops.  Respiratory: Normal respiratory effort without tachypnea nor retractions. Breath sounds are clear and equal bilaterally. No wheezes/rales/rhonchi. Gastrointestinal: Soft and non tender. No rebound. No guarding.  Genitourinary: Deferred Musculoskeletal: Normal range of motion in all extremities. No lower extremity edema. Neurologic:  Somnolent. Skin:  Abrasions and lacerations to the top of the head.  ____________________________________________    LABS (pertinent positives/negatives)  Salicylate and acetaminophen below threshold Ethanol 323 CBC wbc 6.8, hgb 13.0, plt 121 ____________________________________________   EKG  None  ____________________________________________    RADIOLOGY  CT head No acute intracranial  process  ____________________________________________   PROCEDURES  Procedures  ____________________________________________   INITIAL IMPRESSION / ASSESSMENT AND PLAN / ED COURSE  Pertinent labs & imaging results that were available during my care of the patient were reviewed by me and considered in my medical decision making (see chart for details).   Patient presented to the emergency department today because of abnormal behavior exhibited at a [employer].  Patient was given Haldol prior to arrival and was somewhat somnolent on my exam.  Did have evidence of head trauma with some abrasions.  CT head was obtained which did not show any acute intracranial process.  At this point will continue IVC and have psychiatry evaluate.  The patient has been placed in psychiatric observation due to the need to provide a safe environment for the patient while obtaining psychiatric consultation and evaluation, as well as ongoing medical and medication management to treat the patient's condition.  The patient has been placed under full IVC at this time.  ___________________________________________   FINAL CLINICAL IMPRESSION(S) / ED DIAGNOSES  Final diagnoses:  Abnormal behavior  Alcohol abuse     Note: This dictation was prepared with Dragon dictation. Any transcriptional errors that result from this process are unintentional  Phineas Semen, MD 09/27/20 2042

## 2020-09-27 NOTE — ED Notes (Signed)
Pt back to his room.  ?

## 2020-09-27 NOTE — Consult Note (Signed)
[facility]   Reason for Consult: Consult for 34 year old man with history of alcohol abuse brought back to the emergency room intoxicated Referring Physician: Derrill KayGoodman Patient Identification: Parthenia Amesony P Shurley MRN:  [id number] Principal Diagnosis: Alcohol abuse with alcohol-induced mood disorder (HCC) Diagnosis:  Principal Problem:   Alcohol abuse with alcohol-induced mood disorder (HCC)   Total Time spent with patient: 45 minutes  Subjective:   Parthenia Amesony P Macon is a 34 y.o. male patient admitted with patient not able to give information because of intoxication.  HPI: 34 year old man with a history of alcohol abuse.  He was just released from the emergency room this morning.  [facility] and urinating on himself.  When confronted he fell down and hit his head on the ground.  Uncooperative with police.  Now in the emergency room on IVC.  Patient is asleep and either unarousable or choosing not to wake up for interview.  Clearly intoxicated.  No evidence of attempt to harm himself  Past Psychiatric History: Long history of alcohol abuse.  Frequent presentations to emergency room.  No evidence of sustained treatment attempts  Risk to Self:   Risk to Others:   Prior Inpatient Therapy:   Prior Outpatient Therapy:    Past Medical History:  Past Medical History:  Diagnosis Date  . Alcohol abuse   . HIV (human immunodeficiency virus infection) (HCC)   . Subdural hematoma [facility](HCC)     Past Surgical History:  Procedure Laterality Date  . INCISION AND DRAINAGE PERIRECTAL ABSCESS N/A 09/24/2016   Procedure: IRRIGATION AND DEBRIDEMENT PERIRECTAL ABSCESS;  Surgeon: Ricarda Frameharles Woodham, MD;  Location: [facility] ORS;  Service: General;  Laterality: N/A;  . none     Family History:  Family History  Problem Relation Age of Onset  . Alcohol abuse Mother   . Alcohol abuse Father    Family Psychiatric  History: See  previous Social History:  Social History   Substance and Sexual Activity  Alcohol Use Yes     Social History   Substance and Sexual Activity  Drug Use Not Currently  . Types: Marijuana, Methamphetamines   Comment: last used 04/10/2019    Social History   Socioeconomic History  . Marital status: Single    Spouse name: Not on file  . Number of children: Not on file  . Years of education: Not on file  . Highest education level: Not on file  Occupational History  . Occupation: unemployed  Tobacco Use  . Smoking status: Former Smoker    Packs/day: 0.50    Types: Cigarettes  . Smokeless tobacco: Never Used  . Tobacco comment: unable to smoke while incarcerated 6+ months 02/13/20  Vaping Use  . Vaping Use: Never used  Substance and Sexual Activity  . Alcohol use: Yes  . Drug use: Not Currently    Types: Marijuana, Methamphetamines    Comment: last used 04/10/2019  . Sexual activity: Not Currently    Comment: unable to offer condoms while incarcerated  Other Topics Concern  . Not on file  Social History Narrative   "Currently living on the streets"   Independent at baseline   Social Determinants of Health   Financial Resource Strain:   . Difficulty of Paying Living Expenses: Not on file  Food Insecurity:   . Worried About Programme researcher, broadcasting/film/videounning Out of Food in the Last Year: Not on file  . Ran Out of Food in the Last Year: Not on  file  Transportation Needs:   . Freight forwarder (Medical): Not on file  . Lack of Transportation (Non-Medical): Not on file  Physical Activity:   . Days of Exercise per Week: Not on file  . Minutes of Exercise per Session: Not on file  Stress:   . Feeling of Stress : Not on file  Social Connections:   . Frequency of Communication with Friends and Family: Not on file  . Frequency of Social Gatherings with Friends and Family: Not on file  . Attends Religious Services: Not on file  . Active Member of Clubs or Organizations: Not on file  . Attends Occupational hygienist Meetings: Not on file  . Marital Status: Not on file   Additional Social History:    Allergies:   Allergies  Allergen Reactions  . Tegretol [Carbamazepine] Other (See Comments)    Caused vertigo for 2 days after taking it  . Caffeine Palpitations    Labs:  Results for orders placed or performed during the hospital encounter of 09/27/20 (from the past 48 hour(s))  Comprehensive metabolic panel     Status: Abnormal   Collection Time: 09/27/20  4:12 PM  Result Value Ref Range   Sodium 133 (L) 135 - 145 mmol/L   Potassium 3.2 (L) 3.5 - 5.1 mmol/L   Chloride 98 98 - 111 mmol/L   CO2 20 (L) 22 - 32 mmol/L   Glucose, Bld 138 (H) 70 - 99 mg/dL    Comment: Glucose reference range applies only to samples taken after fasting for at least 8 hours.   BUN 7 6 - 20 mg/dL   Creatinine, Ser 0.10 0.61 - 1.24 mg/dL   Calcium 8.7 (L) 8.9 - 10.3 mg/dL   Total Protein 7.3 6.5 - 8.1 g/dL   Albumin 3.8 3.5 - 5.0 g/dL   AST 24 15 - 41 U/L   ALT 13 0 - 44 U/L   Alkaline Phosphatase 68 38 - 126 U/L   Total Bilirubin 0.7 0.3 - 1.2 mg/dL   GFR, Estimated >93 >23 mL/min    Comment: (NOTE) Calculated using the CKD-EPI Creatinine Equation (2021)    Anion gap 15 5 - 15    Comment: Performed at [facility], [address], [place], [place] [address]  Ethanol     Status: Abnormal   Collection Time: 09/27/20  4:12 PM  Result Value Ref Range   Alcohol, Ethyl (B) 323 (HH) <10 mg/dL    Comment: CRITICAL RESULT CALLED TO, READ BACK BY AND VERIFIED WITH AMY BOISVERT ON 09/27/20 AT 1646 BY JAG (NOTE) Lowest detectable limit for serum alcohol is 10 mg/dL.  For medical purposes only. Performed at [facility] [address] [place], [place] [address]   Salicylate level     Status: Abnormal   Collection Time: 09/27/20  4:12 PM  Result Value Ref Range   Salicylate Lvl <7.0 (L) 7.0 - 30.0 mg/dL    Comment: Performed at [facility], [address]  [place], [place] [address]  Acetaminophen level     Status: Abnormal   Collection Time: 09/27/20  4:12 PM  Result Value Ref Range   Acetaminophen (Tylenol), Serum <10 (L) 10 - 30 ug/mL    Comment: (NOTE) Therapeutic concentrations vary significantly. A range of 10-30 ug/mL  may be an effective concentration for many patients. However, some  are best treated at concentrations outside of this range. Acetaminophen concentrations >150 ug/mL at 4 hours after ingestion  and >  50 ug/mL at 12 hours after ingestion are often associated with  toxic reactions.  Performed at [facility], [address] [place], [place] [address]   cbc     Status: Abnormal   Collection Time: 09/27/20  4:12 PM  Result Value Ref Range   WBC 6.8 4.0 - 10.5 K/uL   RBC 3.82 (L) 4.22 - 5.81 MIL/uL   Hemoglobin 13.0 13.0 - 17.0 g/dL   HCT 50.2 (L) 39 - 52 %   MCV 94.5 80.0 - 100.0 fL   MCH 34.0 26.0 - 34.0 pg   MCHC 36.0 30.0 - 36.0 g/dL   RDW 77.4 12.8 - 78.6 %   Platelets 121 (L) 150 - 400 K/uL   nRBC 0.0 0.0 - 0.2 %    Comment: Performed at [facility], [address] [place], [place] [address]    No current facility-administered medications for this encounter.   Current Outpatient Medications  Medication Sig Dispense Refill  . chlordiazePOXIDE (LIBRIUM) 25 MG capsule 25 mg PO TID x 1 day, then 25 mg PO BID X 1 day,  then 25  PO QD X 1 day (Patient not taking: Reported on 07/04/2020) 6 capsule 0  . [START ON 09/28/2020] elvitegravir-cobicistat-emtricitabine-tenofovir (GENVOYA) 150-150-200-10 MG TABS tablet Take 1 tablet by mouth daily with breakfast. 30 tablet 0  . FLUoxetine (PROZAC) 20 MG capsule Take 1 capsule (20 mg total) by mouth daily. 30 capsule 0  . nicotine (NICODERM CQ - DOSED IN MG/24 HOURS) 21 mg/24hr patch Place 1 patch (21 mg total) onto the skin daily. (Patient not taking: Reported on 06/07/2020) 28 patch 0  . pantoprazole (PROTONIX) 40 MG tablet Take 1 tablet (40 mg total) by  mouth daily. 30 tablet 0  . sucralfate (CARAFATE) 1 GM/10ML suspension Take 10 mLs (1 g total) by mouth 4 (four) times daily -  with meals and at bedtime for 14 days. (Patient not taking: Reported on 06/07/2020) 560 mL 0    Musculoskeletal: Strength & Muscle Tone: decreased Gait & Station: unable to stand Patient leans: N/A  Psychiatric Specialty Exam: Physical Exam Vitals and nursing note reviewed.  Constitutional:      Appearance: He is well-developed.  HENT:     Head: Normocephalic and atraumatic.  Eyes:     Conjunctiva/sclera: Conjunctivae normal.     Pupils: Pupils are equal, round, and reactive to light.  Cardiovascular:     Heart sounds: Normal heart sounds.  Pulmonary:     Effort: Pulmonary effort is normal.  Abdominal:     Palpations: Abdomen is soft.  Musculoskeletal:        General: Normal range of motion.     Cervical back: Normal range of motion.  Skin:    General: Skin is warm and dry.       Neurological:     Mental Status: He is alert.  Psychiatric:        Attention and Perception: He is inattentive.        Speech: He is noncommunicative.     Review of Systems  Unable to perform ROS: Patient nonverbal    Blood pressure 131/82, pulse 99, temperature 98.2 F (36.8 C), temperature source Oral, resp. rate 18, height 6\' 2"  (1.88 m), weight 74 kg, SpO2 99 %.Body mass index is 20.95 kg/m.  General Appearance: Disheveled  Eye Contact:  None  Speech:  Negative  Volume:  Decreased  Mood:  Negative  Affect:  Negative  Thought Process:  NA  Orientation:  Negative  Thought Content:  Negative  Suicidal Thoughts:  Unknown  Homicidal Thoughts:  Unknown  Memory:  Negative  Judgement:  Negative  Insight:  Negative  Psychomotor Activity:  Negative  Concentration:  Concentration: Negative  Recall:  Negative  Fund of Knowledge:  Negative  Language:  Negative  Akathisia:  Negative  Handed:  Right  AIMS (if indicated):     Assets:  Resilience  ADL's:   Impaired  Cognition:  Impaired,  Severe  Sleep:        Treatment Plan Summary: Plan Patient with alcohol abuse just discharged this morning.  Obviously immediately relapsed into drinking.  Currently intoxicated.  Based on behavior yesterday I might expect him to get up and complain of feeling sick and need some symptomatic medication.  Once he did sober up the patient did not become delirious did not have seizures and had stable vital signs.  He can be reassessed once sober at which point he may be ready for discharge if he does not wish to be referred to further substance abuse treatment  Disposition: No evidence of imminent risk to self or others at present.   Patient does not meet criteria for psychiatric inpatient admission. Supportive therapy provided about ongoing stressors. Discussed crisis plan, support from social network, calling 911, coming to the Emergency Department, and calling Suicide Hotline.  Mordecai Rasmussen, MD 09/27/2020 5:36 PM

## 2020-09-27 NOTE — ED Notes (Signed)
Patient re-evaluated by dr. Toni Amend. Patient IVC rescinded and patient was discharged to home.

## 2020-09-27 NOTE — ED Provider Notes (Signed)
The patient has been evaluated at bedside by Dr. Toni Amend, psychiatry.  Patient is clinically stable.  Not felt to be a danger to self or others.  No SI or Hi.  No indication for inpatient psychiatric admission at this time.  Appropriate for continued outpatient therapy.    Willy Eddy, MD 09/27/20 [phone]

## 2020-09-27 NOTE — ED Notes (Signed)
Hourly rounding completed at this time, patient currently awake in room. No complaints, stable, and in no acute distress. Q15 minute rounds and monitoring via [employer] to continue. Pt states he does not want his vitals collected at this time, only wants his medications and to return to sleep.

## 2020-09-27 NOTE — BH Assessment (Signed)
[facility] has no appropriate beds available for patient  Referral information for Psychiatric Hospitalization faxed to;   [facility] [place] [phone] or [phone])  . [facility] [phone]).   [facility] [phone]) [facility] reports no appropriate beds available    [facility] [phone] or [phone])    [facility] [phone]),

## 2020-09-27 NOTE — Consult Note (Signed)
[facility] Face-to-Face Psychiatry Consult   Reason for Consult:   Follow-up for this 34 year old man with alcohol abuse depression HIV-positive poor self-care. Referring Physician: Roxan Hockey Patient Identification: ELRIDGE STEMM MRN:  [id number] Principal Diagnosis: Major depressive disorder, recurrent severe without psychotic features (HCC) Diagnosis:  Principal Problem:   Major depressive disorder, recurrent severe without psychotic features (HCC) Active Problems:   HIV disease (HCC)   Alcohol abuse with alcohol-induced mood disorder (HCC)   Alcohol withdrawal (HCC)   Total Time spent with patient: 30 minutes  Subjective:   Gary [name] is a 34 y.o. male patient admitted with "I want to go".  HPI: Patient seen for follow-up.  Patient today is calmer.  Not hostile or aggressive.  He settled down and got some rest overnight.  Despite his previously high alcohol level his outward and measurable symptoms of withdrawal have been fairly minor.  No seizure occurred.  Vital signs stable.  CIWA score stayed relatively low.  Patient is not delirious or psychotic.  Reports today that he has no suicidal thoughts.  He is requesting discharge.  He does state that he wants to take care of himself and stop drinking but his commitment to this seems superficial.  At this point no longer meets commitment criteria  Past Psychiatric History: Multiple presentations with similar symptoms and similar outcomes.  Risk to Self: Suicidal Ideation: Yes-Currently Present Suicidal Intent: Yes-Currently Present Is patient at risk for suicide?: Yes Suicidal Plan?: Yes-Currently Present Specify Current Suicidal Plan: "Drink myself to death." Access to Means: Yes Specify Access to Suicidal Means: Alcohol What has been your use of drugs/alcohol within the last 12 months?: Alcohol How many times?: 2 Other Self Harm Risks: Substance Abuse Triggers for Past Attempts: Spouse contact, Other (Comment) Intentional Self  Injurious Behavior: None Risk to Others: Homicidal Ideation: No Thoughts of Harm to Others: No Current Homicidal Intent: No Current Homicidal Plan: No Access to Homicidal Means: No Identified Victim: Reports of none History of harm to others?: No Assessment of Violence: None Noted Violent Behavior Description: Reports of none Does patient have access to weapons?: No Criminal Charges Pending?: No Does patient have a court date: No Prior Inpatient Therapy: Prior Inpatient Therapy: Yes Prior Therapy Dates: 04/2019 Prior Therapy Facilty/Provider(s): [facility] Reason for Treatment: Major Depression Prior Outpatient Therapy: Prior Outpatient Therapy: No Does patient have an ACCT team?: No Does patient have Intensive In-House Services?  : No Does patient have [facility] services? : No Does patient have P4CC services?: No  Past Medical History:  Past Medical History:  Diagnosis Date  . Alcohol abuse   . HIV (human immunodeficiency virus infection) (HCC)   . Subdural hematoma [facility])     Past Surgical History:  Procedure Laterality Date  . INCISION AND DRAINAGE PERIRECTAL ABSCESS N/A 09/24/2016   Procedure: IRRIGATION AND DEBRIDEMENT PERIRECTAL ABSCESS;  Surgeon: Ricarda Frame, MD;  Location: [facility] ORS;  Service: General;  Laterality: N/A;  . none     Family History:  Family History  Problem Relation Age of Onset  . Alcohol abuse Mother   . Alcohol abuse Father    Family Psychiatric  History: Alcohol abuse reportedly on both sides of the family Social History:  Social History   Substance and Sexual Activity  Alcohol Use Yes     Social History   Substance and Sexual Activity  Drug Use Not Currently  . Types: Marijuana, Methamphetamines   Comment: last used 04/10/2019    Social History   Socioeconomic History  .  Marital status: Single    Spouse name: Not on file  . Number of children: Not on file  . Years of education: Not on file  . Highest education level: Not on file   Occupational History  . Occupation: unemployed  Tobacco Use  . Smoking status: Former Smoker    Packs/day: 0.50    Types: Cigarettes  . Smokeless tobacco: Never Used  . Tobacco comment: unable to smoke while incarcerated 6+ months 02/13/20  Vaping Use  . Vaping Use: Never used  Substance and Sexual Activity  . Alcohol use: Yes  . Drug use: Not Currently    Types: Marijuana, Methamphetamines    Comment: last used 04/10/2019  . Sexual activity: Not Currently    Comment: unable to offer condoms while incarcerated  Other Topics Concern  . Not on file  Social History Narrative   "Currently living on the streets"   Independent at baseline   Social Determinants of Health   Financial Resource Strain:   . Difficulty of Paying Living Expenses: Not on file  Food Insecurity:   . Worried About Programme researcher, broadcasting/film/video in the Last Year: Not on file  . Ran Out of Food in the Last Year: Not on file  Transportation Needs:   . Lack of Transportation (Medical): Not on file  . Lack of Transportation (Non-Medical): Not on file  Physical Activity:   . Days of Exercise per Week: Not on file  . Minutes of Exercise per Session: Not on file  Stress:   . Feeling of Stress : Not on file  Social Connections:   . Frequency of Communication with Friends and Family: Not on file  . Frequency of Social Gatherings with Friends and Family: Not on file  . Attends Religious Services: Not on file  . Active Member of Clubs or Organizations: Not on file  . Attends Banker Meetings: Not on file  . Marital Status: Not on file   Additional Social History:    Allergies:   Allergies  Allergen Reactions  . Tegretol [Carbamazepine] Other (See Comments)    Caused vertigo for 2 days after taking it  . Caffeine Palpitations    Labs:  Results for orders placed or performed during the hospital encounter of 09/26/20 (from the past 48 hour(s))  CBC with Differential     Status: Abnormal   Collection Time:  09/26/20  2:21 AM  Result Value Ref Range   WBC 10.9 (H) 4.0 - 10.5 K/uL   RBC 4.10 (L) 4.22 - 5.81 MIL/uL   Hemoglobin 13.7 13.0 - 17.0 g/dL   HCT 09.8 39 - 52 %   MCV 97.6 80.0 - 100.0 fL   MCH 33.4 26.0 - 34.0 pg   MCHC 34.3 30.0 - 36.0 g/dL   RDW 11.9 14.7 - 82.9 %   Platelets 211 150 - 400 K/uL   nRBC 0.0 0.0 - 0.2 %   Neutrophils Relative % 59 %   Neutro Abs 6.4 1.7 - 7.7 K/uL   Lymphocytes Relative 33 %   Lymphs Abs 3.6 0.7 - 4.0 K/uL   Monocytes Relative 5 %   Monocytes Absolute 0.6 0.1 - 1.0 K/uL   Eosinophils Relative 2 %   Eosinophils Absolute 0.3 0.0 - 0.5 K/uL   Basophils Relative 1 %   Basophils Absolute 0.1 0.0 - 0.1 K/uL   Immature Granulocytes 0 %   Abs Immature Granulocytes 0.04 0.00 - 0.07 K/uL    Comment: Performed at [facility], [address] [place], [place] [address]  Comprehensive metabolic panel     Status: Abnormal   Collection Time: 09/26/20  2:21 AM  Result Value Ref Range   Sodium 144 135 - 145 mmol/L   Potassium 3.4 (L) 3.5 - 5.1 mmol/L   Chloride 103 98 - 111 mmol/L   CO2 26 22 - 32 mmol/L   Glucose, Bld 102 (H) 70 - 99 mg/dL    Comment: Glucose reference range applies only to samples taken after fasting for at least 8 hours.   BUN 8 6 - 20 mg/dL   Creatinine, Ser 6.040.62 0.61 - 1.24 mg/dL   Calcium 8.4 (L) 8.9 - 10.3 mg/dL   Total Protein 7.8 6.5 - 8.1 g/dL   Albumin 4.3 3.5 - 5.0 g/dL   AST 27 15 - 41 U/L   ALT 18 0 - 44 U/L   Alkaline Phosphatase 67 38 - 126 U/L   Total Bilirubin 0.6 0.3 - 1.2 mg/dL   GFR, Estimated >54>60 >09>60 mL/min    Comment: (NOTE) Calculated using the CKD-EPI Creatinine Equation (2021)    Anion gap 15 5 - 15    Comment: Performed at [facility], [address] [place], [place] [address]  Ethanol     Status: Abnormal   Collection Time: 09/26/20  2:21 AM  Result Value Ref Range   Alcohol, Ethyl (B) 450 (HH) <10 mg/dL    Comment: CRITICAL RESULT CALLED TO, READ BACK BY AND VERIFIED  WITH CHRIS BUCKNER @0334  ON 09/26/20 (NOTE) Lowest detectable limit for serum alcohol is 10 mg/dL.  For medical purposes only. Performed at [facility], [address] [place], [place] [address]   Salicylate level     Status: Abnormal   Collection Time: 09/26/20  2:21 AM  Result Value Ref Range   Salicylate Lvl <7.0 (L) 7.0 - 30.0 mg/dL    Comment: Performed at [facility], [address] [place], [place] [address]  Acetaminophen level     Status: Abnormal   Collection Time: 09/26/20  2:21 AM  Result Value Ref Range   Acetaminophen (Tylenol), Serum <10 (L) 10 - 30 ug/mL    Comment: (NOTE) Therapeutic concentrations vary significantly. A range of 10-30 ug/mL  may be an effective concentration for many patients. However, some  are best treated at concentrations outside of this range. Acetaminophen concentrations >150 ug/mL at 4 hours after ingestion  and >50 ug/mL at 12 hours after ingestion are often associated with  toxic reactions.  Performed at [facility], [address] [place], [place] [address]   Urine Drug Screen, Qualitative [facility] only)     Status: None   Collection Time: 09/26/20  4:11 AM  Result Value Ref Range   Tricyclic, Ur Screen NONE DETECTED NONE DETECTED   Amphetamines, Ur Screen NONE DETECTED NONE DETECTED   MDMA (Ecstasy)Ur Screen NONE DETECTED NONE DETECTED   Cocaine Metabolite,Ur [place] NONE DETECTED NONE DETECTED   Opiate, Ur Screen NONE DETECTED NONE DETECTED   Phencyclidine (PCP) Ur S NONE DETECTED NONE DETECTED   Cannabinoid 50 Ng, Ur [place] NONE DETECTED NONE DETECTED   Barbiturates, Ur Screen NONE DETECTED NONE DETECTED   Benzodiazepine, Ur Scrn NONE DETECTED NONE DETECTED   Methadone Scn, Ur NONE DETECTED NONE DETECTED    Comment: (NOTE) Tricyclics + metabolites, urine    Cutoff 1000 ng/mL Amphetamines + metabolites, urine  Cutoff 1000 ng/mL MDMA (Ecstasy), urine  Cutoff 500 ng/mL Cocaine Metabolite,  urine          Cutoff 300 ng/mL Opiate + metabolites, urine        Cutoff 300 ng/mL Phencyclidine (PCP), urine         Cutoff 25 ng/mL Cannabinoid, urine                 Cutoff 50 ng/mL Barbiturates + metabolites, urine  Cutoff 200 ng/mL Benzodiazepine, urine              Cutoff 200 ng/mL Methadone, urine                   Cutoff 300 ng/mL  The urine drug screen provides only a preliminary, unconfirmed analytical test result and should not be used for non-medical purposes. Clinical consideration and professional judgment should be applied to any positive drug screen result due to possible interfering substances. A more specific alternate chemical method must be used in order to obtain a confirmed analytical result. Gas chromatography / mass spectrometry (GC/MS) is the preferred confirm atory method. Performed at [facility], [address] [place], [place] [address]   Resp Panel by RT-PCR (Flu A&B, Covid) Nasopharyngeal Swab     Status: None   Collection Time: 09/26/20 10:03 AM   Specimen: Nasopharyngeal Swab; Nasopharyngeal(NP) swabs in vial transport medium  Result Value Ref Range   SARS Coronavirus 2 by RT PCR NEGATIVE NEGATIVE    Comment: (NOTE) SARS-CoV-2 target nucleic acids are NOT DETECTED.  The SARS-CoV-2 RNA is generally detectable in upper respiratory specimens during the acute phase of infection. The lowest concentration of SARS-CoV-2 viral copies this assay can detect is 138 copies/mL. A negative result does not preclude SARS-Cov-2 infection and should not be used as the sole basis for treatment or other patient management decisions. A negative result may occur with  improper specimen collection/handling, submission of specimen other than nasopharyngeal swab, presence of viral mutation(s) within the areas targeted by this assay, and inadequate number of viral copies(<138 copies/mL). A negative result must be combined with clinical observations, patient  history, and epidemiological information. The expected result is Negative.  Fact Sheet for Patients:  BloggerCourse.com  Fact Sheet for Healthcare Providers:  SeriousBroker.it  This test is no t yet approved or cleared by the [place] FDA and  has been authorized for detection and/or diagnosis of SARS-CoV-2 by FDA under an Emergency Use Authorization (EUA). This EUA will remain  in effect (meaning this test can be used) for the duration of the COVID-19 declaration under Section 564(b)(1) of the Act, 21 U.S.C.section 360bbb-3(b)(1), unless the authorization is terminated  or revoked sooner.       Influenza A by PCR NEGATIVE NEGATIVE   Influenza B by PCR NEGATIVE NEGATIVE    Comment: (NOTE) The Xpert Xpress SARS-CoV-2/FLU/RSV plus assay is intended as an aid in the diagnosis of influenza from Nasopharyngeal swab specimens and should not be used as a sole basis for treatment. Nasal washings and aspirates are unacceptable for Xpert Xpress SARS-CoV-2/FLU/RSV testing.  Fact Sheet for Patients: BloggerCourse.com  Fact Sheet for Healthcare Providers: SeriousBroker.it  This test is not yet approved or cleared by the [place] FDA and has been authorized for detection and/or diagnosis of SARS-CoV-2 by FDA under an Emergency Use Authorization (EUA). This EUA will remain in effect (meaning this test can be used) for the duration of the COVID-19 declaration under Section 564(b)(1) of the Act, 21 U.S.C. section 360bbb-3(b)(1), unless the  authorization is terminated or revoked.  Performed at [facility], [address] [place], [place] [address]     Current Facility-Administered Medications  Medication Dose Route Frequency Provider Last Rate Last Admin  . elvitegravir-cobicistat-emtricitabine-tenofovir (GENVOYA) 150-150-200-10 MG tablet 1 tablet  1 tablet Oral Q  breakfast Vallie Fayette, Jackquline Denmark, MD   1 tablet at 09/27/20 7673  . FLUoxetine (PROZAC) capsule 20 mg  20 mg Oral Daily Gwendlyn Hanback, Jackquline Denmark, MD   20 mg at 09/27/20 0920  . LORazepam (ATIVAN) injection 0-4 mg  0-4 mg Intravenous Q6H Minna Antis, MD       Or  . LORazepam (ATIVAN) tablet 0-4 mg  0-4 mg Oral Q6H Minna Antis, MD   2 mg at 09/27/20 0920  . [START ON 09/28/2020] LORazepam (ATIVAN) injection 0-4 mg  0-4 mg Intravenous Q12H Minna Antis, MD       Or  . Melene Muller ON 09/28/2020] LORazepam (ATIVAN) tablet 0-4 mg  0-4 mg Oral Q12H Minna Antis, MD      . LORazepam (ATIVAN) injection 2 mg  2 mg Intramuscular Once Avrianna Smart T, MD      . ondansetron [facility]) injection 4 mg  4 mg Intramuscular Once Tiauna Whisnant T, MD      . ondansetron (ZOFRAN-ODT) disintegrating tablet 4 mg  4 mg Oral Q8H PRN Delton Prairie, MD   4 mg at 09/26/20 2016  . pantoprazole (PROTONIX) EC tablet 40 mg  40 mg Oral Daily Bart Ashford, Jackquline Denmark, MD   40 mg at 09/27/20 4193  . thiamine tablet 100 mg  100 mg Oral Daily Minna Antis, MD   100 mg at 09/27/20 0920   Or  . thiamine (B-1) injection 100 mg  100 mg Intravenous Daily Minna Antis, MD       Current Outpatient Medications  Medication Sig Dispense Refill  . chlordiazePOXIDE (LIBRIUM) 25 MG capsule 25 mg PO TID x 1 day, then 25 mg PO BID X 1 day,  then 25  PO QD X 1 day (Patient not taking: Reported on 07/04/2020) 6 capsule 0  . [START ON 09/28/2020] elvitegravir-cobicistat-emtricitabine-tenofovir (GENVOYA) 150-150-200-10 MG TABS tablet Take 1 tablet by mouth daily with breakfast. 30 tablet 0  . FLUoxetine (PROZAC) 20 MG capsule Take 1 capsule (20 mg total) by mouth daily. 30 capsule 0  . nicotine (NICODERM CQ - DOSED IN MG/24 HOURS) 21 mg/24hr patch Place 1 patch (21 mg total) onto the skin daily. (Patient not taking: Reported on 06/07/2020) 28 patch 0  . pantoprazole (PROTONIX) 40 MG tablet Take 1 tablet (40 mg total) by mouth daily. 30 tablet 0   . sucralfate (CARAFATE) 1 GM/10ML suspension Take 10 mLs (1 g total) by mouth 4 (four) times daily -  with meals and at bedtime for 14 days. (Patient not taking: Reported on 06/07/2020) 560 mL 0    Musculoskeletal: Strength & Muscle Tone: within normal limits Gait & Station: normal Patient leans: N/A  Psychiatric Specialty Exam: Physical Exam Vitals and nursing note reviewed.  Constitutional:      Appearance: He is well-developed. He is ill-appearing.  HENT:     Head: Normocephalic and atraumatic.  Eyes:     Conjunctiva/sclera: Conjunctivae normal.     Pupils: Pupils are equal, round, and reactive to light.  Cardiovascular:     Heart sounds: Normal heart sounds.  Pulmonary:     Effort: Pulmonary effort is normal.  Abdominal:     Palpations: Abdomen is soft.  Musculoskeletal:  General: Normal range of motion.     Cervical back: Normal range of motion.  Skin:    General: Skin is warm and dry.  Neurological:     General: No focal deficit present.     Mental Status: He is alert.  Psychiatric:        Attention and Perception: Attention normal.        Mood and Affect: Affect is blunt.        Speech: Speech is delayed.        Behavior: Behavior is slowed.        Thought Content: Thought content is not paranoid or delusional. Thought content does not include homicidal or suicidal ideation.        Cognition and Memory: Memory is impaired.        Judgment: Judgment is impulsive.     Review of Systems  Constitutional: Negative.   HENT: Negative.   Eyes: Negative.   Respiratory: Negative.   Cardiovascular: Negative.   Gastrointestinal: Negative.   Musculoskeletal: Negative.   Skin: Negative.   Neurological: Negative.   Psychiatric/Behavioral: Positive for dysphoric mood. Negative for suicidal ideas.    Blood pressure 116/79, pulse 77, temperature 98.5 F (36.9 C), temperature source Oral, resp. rate 15, height  (1.88 m), weight 74.8 kg, SpO2 97 %.Body mass index  is 21.18 kg/m.  General Appearance: Casual  Eye Contact:  Fair  Speech:  Slow  Volume:  Decreased  Mood:  Euthymic  Affect:  Constricted  Thought Process:  Coherent  Orientation:  Full (Time, Place, and Person)  Thought Content:  Logical  Suicidal Thoughts:  No  Homicidal Thoughts:  No  Memory:  Immediate;   Fair Recent;   Poor Remote;   Fair  Judgement:  Impaired  Insight:  Shallow  Psychomotor Activity:  Decreased  Concentration:  Concentration: Fair  Recall:  [employer] of Knowledge:  Fair  Language:  Fair  Akathisia:  No  Handed:  Right  AIMS (if indicated):     Assets:  Desire for Improvement Resilience  ADL's:  Impaired  Cognition:  Impaired,  Mild  Sleep:        Treatment Plan Summary: Medication management and Plan Patient is denying any suicidal thoughts and is not acting to harm himself.  He gives at least a superficial acknowledgment that he wants to stop drinking.  He accepts my recommendation that I give him prescriptions for his HIV medicine as well as the Prozac and pantoprazole he has been getting.  Patient counseled about the dangers of continued drinking and strongly encouraged to take seriously the need to make some changes for sobriety.  Case reviewed with emergency room doctor and TTS.  Discontinue IVC  Disposition: No evidence of imminent risk to self or others at present.   Patient does not meet criteria for psychiatric inpatient admission. Supportive therapy provided about ongoing stressors.  Mordecai Rasmussen, MD 09/27/2020 10:08 AM

## 2020-09-27 NOTE — ED Provider Notes (Signed)
Emergency Medicine Observation Re-evaluation Note  Gary [name] is a 34 y.o. male, seen on rounds today.  Pt initially presented to the ED for complaints of Alcohol Intoxication Currently, the patient is resting.  Physical Exam  BP 124/82 (BP Location: Right Arm)   Pulse (!) 108   Temp (!) 100.8 F (38.2 C) (Oral)   Resp 18   Ht 6\' 2"  (1.88 m)   Wt 74.8 kg   SpO2 96%   BMI 21.18 kg/m  Physical Exam General: resting Cardiac: well perfused Lungs: even and unlabored Psych: resting  ED Course / MDM  EKG:    I have reviewed the labs performed to date as well as medications administered while in observation.  Recent changes in the last 24 hours include .  Plan  Current plan is for psych. Patient is under full IVC at this time.   , MD 09/27/20 [phone]

## 2020-09-27 NOTE — ED Triage Notes (Signed)
Pt arrived in ED via EMS after being picked up at [facility]. Pt urinated all over the store and himself.  Pt became violent with EMS and received 5 mg Haldol IM.

## 2020-09-27 NOTE — ED Notes (Signed)
Pt to CT scan.

## 2020-09-28 MED ORDER — LORAZEPAM 2 MG/ML IJ SOLN: 0.0000 mg | Freq: Two times a day (BID) | INTRAMUSCULAR | Status: DC

## 2020-09-28 MED ORDER — CHLORDIAZEPOXIDE HCL 25 MG PO CAPS: ORAL_CAPSULE | ORAL | 0 refills | Status: DC

## 2020-09-28 MED ORDER — THIAMINE HCL 100 MG/ML IJ SOLN: 100.0000 mg | Freq: Every day | INTRAMUSCULAR | Status: DC

## 2020-09-28 MED ORDER — LORAZEPAM 2 MG PO TABS
0.0000 mg | ORAL_TABLET | Freq: Four times a day (QID) | ORAL | Status: DC
  Administered 2020-09-28 (×3): 2 mg via ORAL
  Filled 2020-09-28 (×3): qty 1

## 2020-09-28 MED ORDER — LORAZEPAM 2 MG PO TABS
2.0000 mg | ORAL_TABLET | Freq: Once | ORAL | Status: AC
  Administered 2020-09-28: 2 mg via ORAL
  Filled 2020-09-28: qty 1

## 2020-09-28 MED ORDER — LORAZEPAM 2 MG/ML IJ SOLN: 0.0000 mg | Freq: Four times a day (QID) | INTRAMUSCULAR | Status: DC

## 2020-09-28 MED ORDER — THIAMINE HCL 100 MG PO TABS
100.0000 mg | ORAL_TABLET | Freq: Every day | ORAL | Status: DC
  Administered 2020-09-28: 100 mg via ORAL
  Filled 2020-09-28: qty 1

## 2020-09-28 MED ORDER — LORAZEPAM 2 MG PO TABS: 0.0000 mg | ORAL_TABLET | Freq: Two times a day (BID) | ORAL | Status: DC

## 2020-09-28 NOTE — ED Notes (Signed)
Comm check for telepsych [facility]

## 2020-09-28 NOTE — ED Notes (Signed)
Breakfast tray given at this time.  

## 2020-09-28 NOTE — ED Notes (Signed)
Report to TELE Psych att, Dr Jonelle Sidle

## 2020-09-28 NOTE — ED Notes (Addendum)
Pt cleaned of urine, pt clothing wet with urine and bed wet as well; pt dressed in new behavioral clothes and linen replaced after bed cleaned ' Pt slurring but answers direct simple questions Denies SI/HI, reports doesn't know how he got here

## 2020-09-28 NOTE — ED Notes (Signed)
Pt awake and asking for meds for ETOH withdrawal, pt reports daily drinker of multiple ETOH sources for at least 2 years, reports detox at [facility] prior

## 2020-09-28 NOTE — ED Notes (Signed)
Pt talking with [facility]

## 2020-09-28 NOTE — ED Notes (Signed)
Pt up to toilet and returned to bed

## 2020-09-28 NOTE — ED Notes (Signed)
This RN to bedside to give pt Ativan, pt states, "where is the doctor at? I have been here long enough" Explained to pt that we were waiting on the psychiatrist on call to give [place] a call so they could reevaluate him. Pt states, "I was hoping I would be released." This RN reiterated that we were waiting on the Psychiatrist on call us so they could reevaluate him.

## 2020-09-28 NOTE — ED Notes (Signed)
Soc called at 1:15

## 2020-09-28 NOTE — ED Provider Notes (Signed)
Emergency Medicine Observation Re-evaluation Note  Gary [name] is a 34 y.o. male, seen on rounds today.  Pt initially presented to the ED for complaints of Psychiatric Evaluation Currently, the patient is calm, resting.  Physical Exam  BP 116/83 (BP Location: Right Arm)   Pulse 98   Temp 98.6 F (37 C) (Oral)   Resp 20   Ht 6\' 2"  (1.88 m)   Wt 74 kg   SpO2 99%   BMI 20.95 kg/m  Physical Exam General: Calm, in NAD Cardiac: Well perfused Lungs: Normal WOB Psych: Calm  ED Course / MDM  EKG:    I have reviewed the labs performed to date as well as medications administered while in observation.  Recent changes in the last 24 hours include ativan 2 mg PO overnight.  Plan  Current plan is for psych disposition. Patient is under full IVC at this time.   , MD 09/28/20 [phone]

## 2020-09-28 NOTE — ED Provider Notes (Signed)
I received a call from Dr. Jonelle Sidle on-call telepsych attending who stated patient was safe for discharge with plan for outpatient alcohol counseling.  Rx written for Librium.  Discharged stable condition.  Strict precautions provided in writing.   Gilles Chiquito, MD 09/28/20 [phone]

## 2020-09-28 NOTE — ED Notes (Signed)
Lunch meal tray given at this time.  

## 2020-09-28 NOTE — ED Notes (Signed)
Pt up and given 2 cups of water, assess for WD s/sx

## 2020-09-29 NOTE — ED Notes (Signed)
Pt ready to be d/c at this time. Tried to coordinate with pt transportation home but pt insisted that he would able to find his way home after offering to call a taxi for him. Notified Dr. Marisa Severin, MD and Alfonso Ramus RN that I felt it was safe to d/c pt to the lobby at this time. Pt has a steady gait and CIWA at this time is 0. Pt denies any other issues at this time.

## 2020-09-29 NOTE — ED Notes (Addendum)
Pt given all 3 belongings bags at this time. Walked pt out at this time, pt has steady gait. Pt sign d/c signature and denies any needs at this time.

## 2020-09-29 NOTE — ED Notes (Signed)
Call to pt's friend for pt pick up, limited resources for pt ID'd, pt reports plan to walk to friends house

## 2020-09-29 NOTE — ED Notes (Signed)
No answer to pt's call

## 2020-09-29 NOTE — ED Notes (Signed)
Pt given breakfast tray

## 2020-10-04 ENCOUNTER — Emergency Department
Admission: EM | Admit: 2020-10-04 | Discharge: 2020-10-05 | Disposition: A | Discharge status: Home / Self Care | Payer: Self-pay | Attending: Emergency Medicine | Admitting: Emergency Medicine

## 2020-10-04 ENCOUNTER — Emergency Department: Payer: Self-pay

## 2020-10-04 ENCOUNTER — Other Ambulatory Visit: Payer: Self-pay

## 2020-10-04 ENCOUNTER — Encounter: Payer: Self-pay | Admitting: Emergency Medicine

## 2020-10-04 DIAGNOSIS — Z87891 Personal history of nicotine dependence: Secondary | ICD-10-CM | POA: Insufficient documentation

## 2020-10-04 DIAGNOSIS — F1010 Alcohol abuse, uncomplicated: Secondary | ICD-10-CM | POA: Insufficient documentation

## 2020-10-04 DIAGNOSIS — F1510 Other stimulant abuse, uncomplicated: Secondary | ICD-10-CM | POA: Insufficient documentation

## 2020-10-04 LAB — CBC
HCT: 42.1 % (ref 39.0–52.0)
Hemoglobin: 14.7 g/dL (ref 13.0–17.0)
MCH: 33 pg (ref 26.0–34.0)
MCHC: 34.9 g/dL (ref 30.0–36.0)
MCV: 94.6 fL (ref 80.0–100.0)
Platelets: 238 10*3/uL (ref 150–400)
RBC: 4.45 MIL/uL (ref 4.22–5.81)
RDW: 13.2 % (ref 11.5–15.5)
WBC: 6.4 10*3/uL (ref 4.0–10.5)
nRBC: 0 % (ref 0.0–0.2)

## 2020-10-04 LAB — COMPREHENSIVE METABOLIC PANEL
ALT: 19 U/L (ref 0–44)
AST: 31 U/L (ref 15–41)
Albumin: 4.5 g/dL (ref 3.5–5.0)
Alkaline Phosphatase: 79 U/L (ref 38–126)
Anion gap: 14 (ref 5–15)
BUN: 7 mg/dL (ref 6–20)
CO2: 24 mmol/L (ref 22–32)
Calcium: 9.4 mg/dL (ref 8.9–10.3)
Chloride: 99 mmol/L (ref 98–111)
Creatinine, Ser: 0.58 mg/dL — ABNORMAL LOW (ref 0.61–1.24)
GFR, Estimated: >60 mL/min (ref >60)
Glucose, Bld: 106 mg/dL — ABNORMAL HIGH (ref 70–99)
Potassium: 3.8 mmol/L (ref 3.5–5.1)
Sodium: 137 mmol/L (ref 135–145)
Total Bilirubin: 0.7 mg/dL (ref 0.3–1.2)
Total Protein: 8.5 g/dL — ABNORMAL HIGH (ref 6.5–8.1)

## 2020-10-04 LAB — ETHANOL: Alcohol, Ethyl (B): 338 mg/dL (ref <10)

## 2020-10-04 LAB — URINE DRUG SCREEN, QUALITATIVE (ARMC ONLY)
Amphetamines, Ur Screen: POSITIVE — AB
Barbiturates, Ur Screen: NOT DETECTED
Benzodiazepine, Ur Scrn: POSITIVE — AB
Cannabinoid 50 Ng, Ur ~~LOC~~: NOT DETECTED
Cocaine Metabolite,Ur ~~LOC~~: NOT DETECTED
MDMA (Ecstasy)Ur Screen: NOT DETECTED
Methadone Scn, Ur: NOT DETECTED
Opiate, Ur Screen: NOT DETECTED
Phencyclidine (PCP) Ur S: NOT DETECTED

## 2020-10-04 LAB — TROPONIN I (HIGH SENSITIVITY)
Troponin I (High Sensitivity): 39 ng/L — ABNORMAL HIGH (ref <18)
Troponin I (High Sensitivity): 18 ng/L — ABNORMAL HIGH (ref <18)

## 2020-10-04 IMAGING — CR DG CHEST 2V
1 series · 3 of 3 positions shown · non-contrast
Comparison: Prior chest radiographs [DATE].

CLINICAL DATA: Chest pressure.

EXAM:
CHEST - 2 VIEW

[Series 1: dg chest 2 view · 0.14mm/px · 3 of 3 slices shown]
[im 1/3]
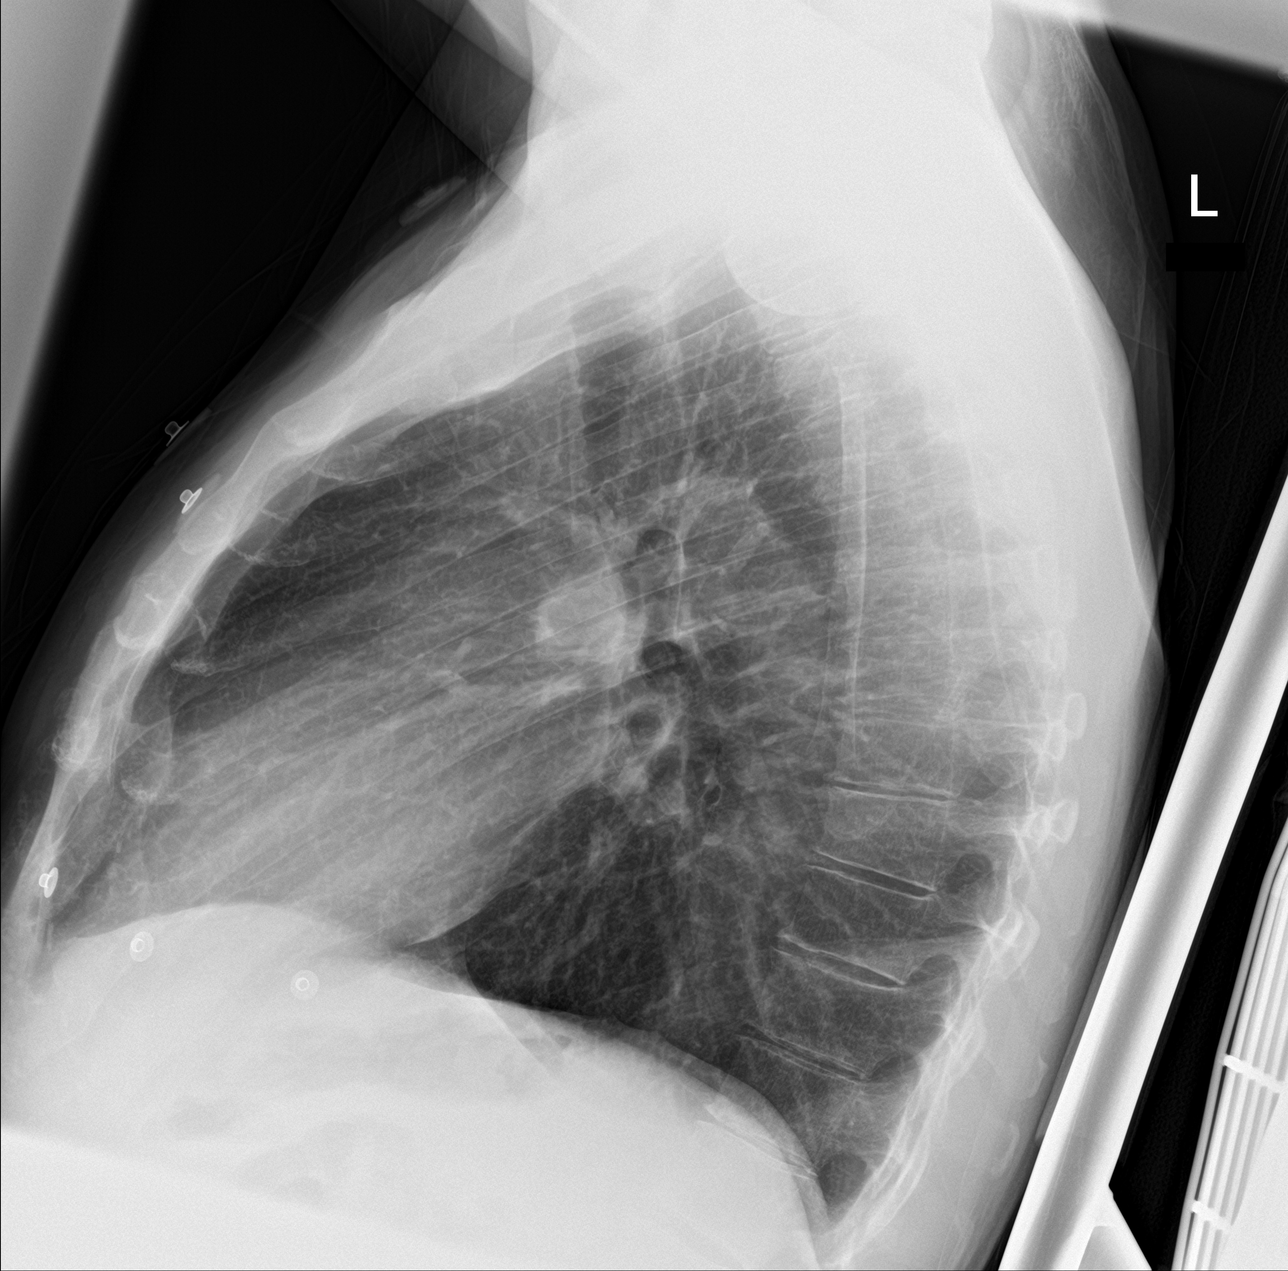
[im 2/3]
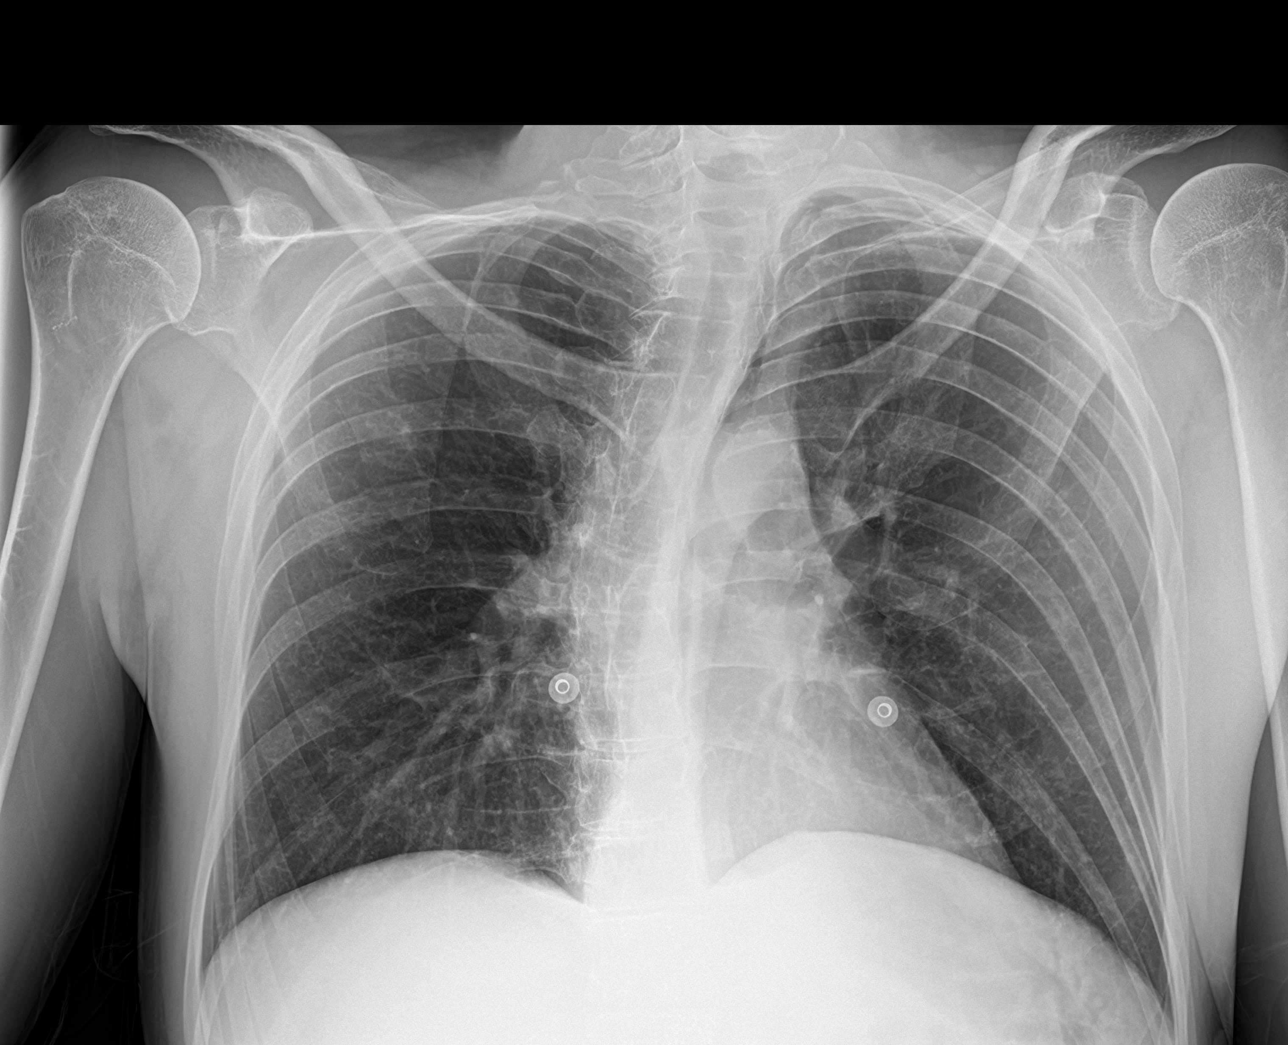
[im 3/3]
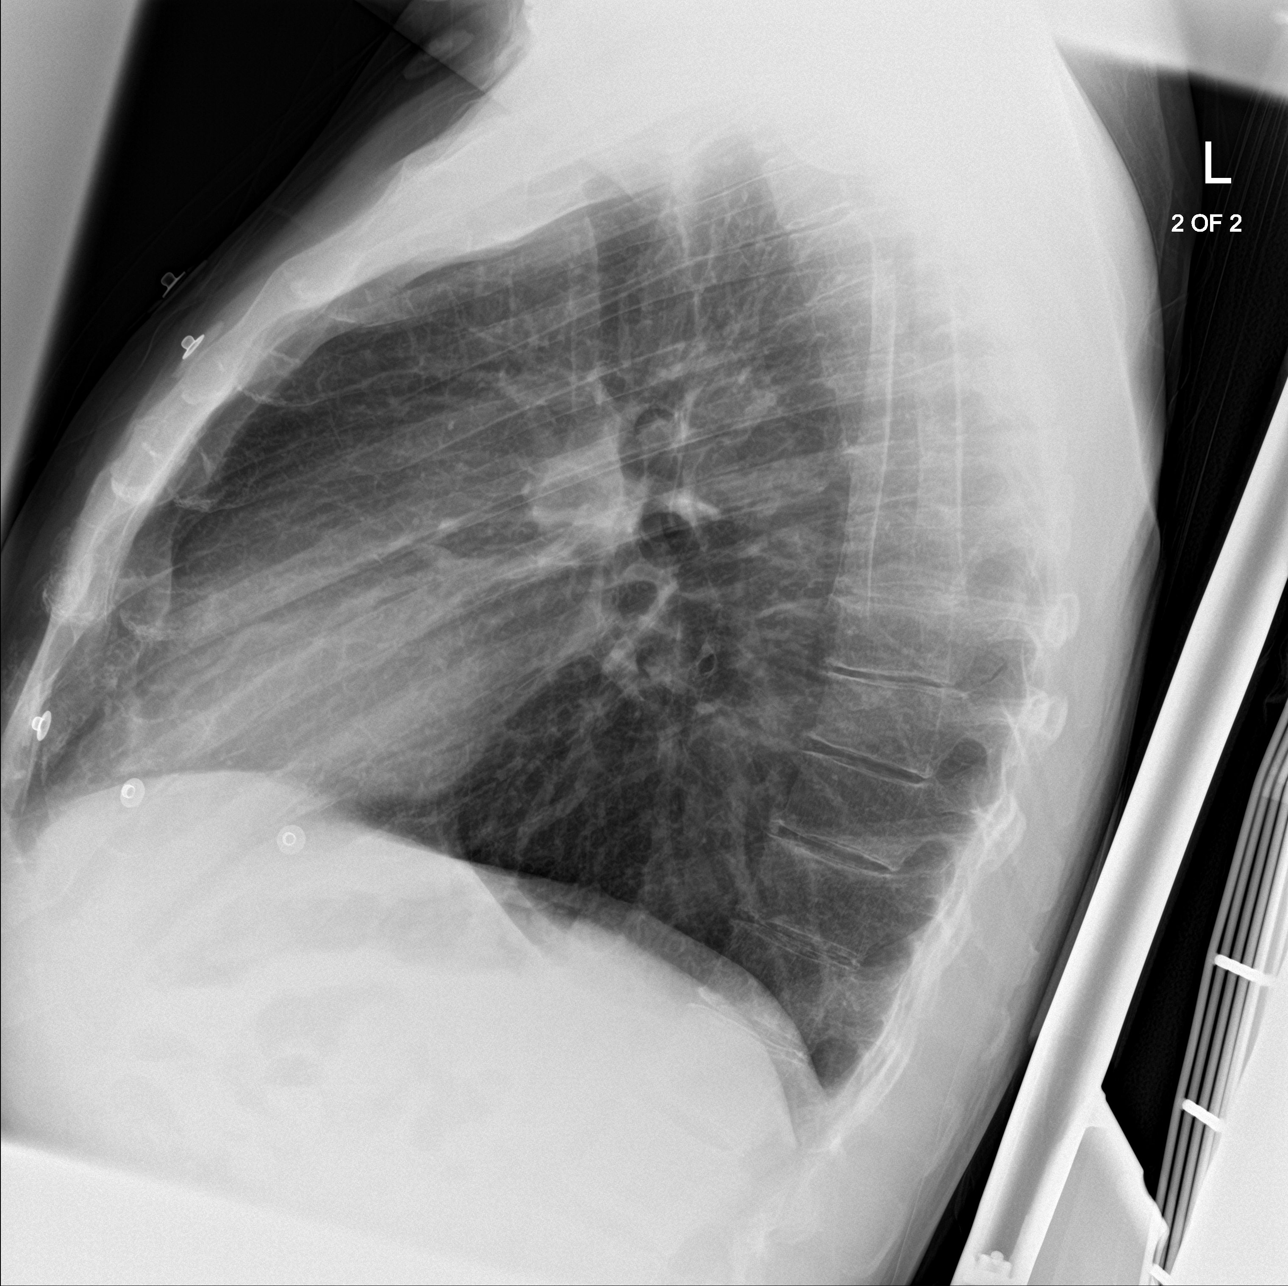

[3 of 3 positions shown; findings below may reference images not displayed]

FINDINGS: Heart size within normal limits.

No appreciable airspace consolidation.No evidence of pleural
effusion or pneumothorax.

No acute bony abnormality identified.
IMPRESSION: No evidence of acute cardiopulmonary abnormality.

## 2020-10-04 MED ORDER — SODIUM CHLORIDE 0.9 % IV BOLUS
1000.0000 mL | Freq: Once | INTRAVENOUS | Status: AC
  Administered 2020-10-04: 1000 mL via INTRAVENOUS

## 2020-10-04 MED ORDER — LORAZEPAM 2 MG/ML IJ SOLN
2.0000 mg | Freq: Once | INTRAMUSCULAR | Status: AC
  Administered 2020-10-04: 2 mg via INTRAVENOUS
  Filled 2020-10-04: qty 1

## 2020-10-04 MED ORDER — LORAZEPAM 2 MG/ML IJ SOLN: 1.0000 mg | Freq: Once | INTRAMUSCULAR | Status: DC

## 2020-10-04 NOTE — ED Triage Notes (Signed)
Pt comes into the ED via EMS from the side of the road , the pt called with c/o meth use, not sleeping in 2 days, pt is under the influence of ETOH on arrival, states he feels anxious.

## 2020-10-04 NOTE — ED Notes (Signed)
Pt c/o chest tightness, EKG done and handed to [facility] MD.

## 2020-10-04 NOTE — ED Triage Notes (Signed)
Pt to ED via [facility] with c/o alcohol use and methamphetamine use. Pt states drank 6 bottles of wine today and last smoked methamphetamines earlier today. Pt denies SI/HI. Pt states hasn't slept in 2 days. Pt noted to be paranoid and anxious in triage. Intermittently jumping when quick movements are made.
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
## 2020-10-04 NOTE — ED Notes (Signed)
Pt ambulatory to restroom

## 2020-10-05 MED ORDER — SODIUM CHLORIDE 0.9 % IV BOLUS
1000.0000 mL | Freq: Once | INTRAVENOUS | Status: AC
  Administered 2020-10-05: 1000 mL via INTRAVENOUS

## 2020-10-05 MED ORDER — HALOPERIDOL LACTATE 5 MG/ML IJ SOLN
5.0000 mg | Freq: Once | INTRAMUSCULAR | Status: AC
  Administered 2020-10-05: 5 mg via INTRAVENOUS
  Filled 2020-10-05: qty 1

## 2020-10-05 NOTE — ED Notes (Signed)
Pt noted to have tremors without extending arms

## 2020-10-05 NOTE — Discharge Instructions (Signed)
Drink alcohol only in moderation.  Stop using illicit drugs.  Return to the ER for worsening symptoms, persistent vomiting, difficulty breathing or other concerns.

## 2020-10-05 NOTE — ED Notes (Signed)
Pt signed paper discharge form 

## 2020-10-05 NOTE — ED Provider Notes (Signed)
-----------------------------------------   12:19 AM on 10/05/2020 -----------------------------------------  Patient with wide eyes, still feeling "wired". Will administer second liter IV fluids and try IV Haldol for calming.   ----------------------------------------- 1:00 AM on 10/05/2020 -----------------------------------------  Patient sleeping soundly after IV Haldol.   ----------------------------------------- 6:00 AM on 10/05/2020 -----------------------------------------  No further events overnight.  Patient remains asleep.  Anticipate discharge home once patient is awake and can secure a ride home.   ----------------------------------------- 6:50 AM on 10/05/2020 -----------------------------------------  Care will be transferred to Dr. Cyril Loosen pending sobriety.   Irean Hong, MD 10/05/20 [phone]

## 2020-10-05 NOTE — ED Notes (Signed)
Pt given sprite and saltine crackers

## 2020-10-06 ENCOUNTER — Other Ambulatory Visit: Payer: Self-pay

## 2020-10-06 ENCOUNTER — Emergency Department
Admission: EM | Admit: 2020-10-06 | Discharge: 2020-10-08 | Disposition: A | Discharge status: Home / Self Care | Payer: Self-pay | Attending: Emergency Medicine | Admitting: Emergency Medicine

## 2020-10-06 DIAGNOSIS — Y906 Blood alcohol level of 120-199 mg/100 ml: Secondary | ICD-10-CM | POA: Insufficient documentation

## 2020-10-06 DIAGNOSIS — F10929 Alcohol use, unspecified with intoxication, unspecified: Secondary | ICD-10-CM | POA: Diagnosis present

## 2020-10-06 DIAGNOSIS — F15929 Other stimulant use, unspecified with intoxication, unspecified: Secondary | ICD-10-CM | POA: Insufficient documentation

## 2020-10-06 DIAGNOSIS — Z87891 Personal history of nicotine dependence: Secondary | ICD-10-CM | POA: Insufficient documentation

## 2020-10-06 DIAGNOSIS — B20 Human immunodeficiency virus [HIV] disease: Secondary | ICD-10-CM | POA: Diagnosis present

## 2020-10-06 DIAGNOSIS — R451 Restlessness and agitation: Secondary | ICD-10-CM | POA: Insufficient documentation

## 2020-10-06 DIAGNOSIS — F10129 Alcohol abuse with intoxication, unspecified: Secondary | ICD-10-CM | POA: Diagnosis present

## 2020-10-06 DIAGNOSIS — F1094 Alcohol use, unspecified with alcohol-induced mood disorder: Secondary | ICD-10-CM | POA: Diagnosis present

## 2020-10-06 DIAGNOSIS — F10920 Alcohol use, unspecified with intoxication, uncomplicated: Secondary | ICD-10-CM | POA: Diagnosis present

## 2020-10-06 DIAGNOSIS — F10239 Alcohol dependence with withdrawal, unspecified: Secondary | ICD-10-CM | POA: Diagnosis present

## 2020-10-06 DIAGNOSIS — F1910 Other psychoactive substance abuse, uncomplicated: Secondary | ICD-10-CM

## 2020-10-06 DIAGNOSIS — F10921 Alcohol use, unspecified with intoxication delirium: Secondary | ICD-10-CM | POA: Diagnosis present

## 2020-10-06 DIAGNOSIS — Z79899 Other long term (current) drug therapy: Secondary | ICD-10-CM | POA: Insufficient documentation

## 2020-10-06 DIAGNOSIS — F1014 Alcohol abuse with alcohol-induced mood disorder: Secondary | ICD-10-CM | POA: Insufficient documentation

## 2020-10-06 DIAGNOSIS — F10139 Alcohol abuse with withdrawal, unspecified: Secondary | ICD-10-CM | POA: Insufficient documentation

## 2020-10-06 DIAGNOSIS — Z21 Asymptomatic human immunodeficiency virus [HIV] infection status: Secondary | ICD-10-CM | POA: Diagnosis present

## 2020-10-06 DIAGNOSIS — F10229 Alcohol dependence with intoxication, unspecified: Secondary | ICD-10-CM | POA: Diagnosis present

## 2020-10-06 MED ORDER — HALOPERIDOL LACTATE 5 MG/ML IJ SOLN
5.0000 mg | Freq: Once | INTRAMUSCULAR | Status: AC
  Administered 2020-10-06: 5 mg via INTRAMUSCULAR
  Filled 2020-10-06: qty 1

## 2020-10-06 NOTE — ED Triage Notes (Signed)
Per EMS pt found down on sidewalk outside [place]; pt states he drank 6 bottles of wine and smoked meth

## 2020-10-06 NOTE — ED Notes (Signed)
Pt demonstrating paranoia, high anxiety; is mostly cooperative with instructions. Has not been violent physically or verbally with staff; keeps stating "I'm not going to hurt you." Pt did slam door to bathroom open hard but no verbal outburst. Pt accepted medicine to help him calm (haldol). Pt now resting on stretcher.

## 2020-10-06 NOTE — ED Provider Notes (Signed)
[facility] Emergency Department Provider Note   ____________________________________________    I have reviewed the triage vital signs and the nursing notes.   HISTORY  Chief Complaint Altered Mental Status     HPI Gary [name] is a 34 y.o. male with history of HIV, alcohol abuse, substance abuse who presents today with altered mental status. He reported to EMS that he drank 6 bottles of wine and smoked meth and then was found outside of [employer]. He denies injury. Patient was recently discharged yesterday morning after similar presentation. He reports that he feels "wired".  Past Medical History:  Diagnosis Date  . Alcohol abuse   . HIV (human immunodeficiency virus infection) (HCC)   . Subdural hematoma [facility])     Patient Active Problem List   Diagnosis Date Noted  . Alcohol abuse   . Alcohol poisoning   . Suicidal ideation 06/14/2020  . Pancreatitis 06/13/2020  . 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  . Malnutrition of moderate degree 05/07/2020  . Gastrointestinal hemorrhage   . Alcoholic hepatitis without ascites   . Melena 05/05/2020  . Fever 05/05/2020  . Alcoholic ketoacidosis 05/05/2020  . Alcohol withdrawal (HCC) 04/17/2020  . Thrombocytopenia (HCC) 07/25/2019  . Transaminitis 07/25/2019  . Diarrhea 07/25/2019  . Traumatic subdural hematoma (HCC) 07/02/2019  . Alcohol abuse with intoxication (HCC) 07/02/2019  . Alcohol abuse with alcohol-induced mood disorder (HCC) 05/13/2019  . Alcohol dependence (HCC) 04/16/2019  . 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 HIV disease (HCC) 06/16/2018  . Polysubstance abuse (HCC) 06/16/2018  . Cigarette smoker 06/16/2018    Past Surgical History:  Procedure Laterality Date  . INCISION AND DRAINAGE PERIRECTAL ABSCESS N/A 09/24/2016   Procedure: IRRIGATION AND DEBRIDEMENT PERIRECTAL ABSCESS;  Surgeon: Ricarda Frame, MD;   Location: [facility] ORS;  Service: General;  Laterality: N/A;  . none      Prior to Admission medications   Medication Sig Start Date End Date Taking? Authorizing Provider  chlordiazePOXIDE (LIBRIUM) 25 MG capsule 25 mg PO TID x 1 day, then 25 mg PO BID X 1 day,  then 25  PO QD X 1 day 09/28/20   Gilles Chiquito, MD  elvitegravir-cobicistat-emtricitabine-tenofovir (GENVOYA) 150-150-200-10 MG TABS tablet Take 1 tablet by mouth daily with breakfast. 09/28/20   Clapacs, Jackquline Denmark, MD  FLUoxetine (PROZAC) 20 MG capsule Take 1 capsule (20 mg total) by mouth daily. 09/27/20 10/27/20  Clapacs, Jackquline Denmark, MD  nicotine (NICODERM CQ - DOSED IN MG/24 HOURS) 21 mg/24hr patch Place 1 patch (21 mg total) onto the skin daily. Patient not taking: Reported on 06/07/2020 05/09/20   Burnadette Pop, MD  pantoprazole (PROTONIX) 40 MG tablet Take 1 tablet (40 mg total) by mouth daily. 09/27/20   Clapacs, Jackquline Denmark, MD  sucralfate (CARAFATE) 1 GM/10ML suspension Take 10 mLs (1 g total) by mouth 4 (four) times daily -  with meals and at bedtime for 14 days. Patient not taking: Reported on 06/07/2020 05/09/20 06/07/20  Burnadette Pop, MD  famotidine (PEPCID) 20 MG tablet Take 1 tablet (20 mg total) by mouth 2 (two) times daily. Patient not taking: Reported on 06/01/2019 05/14/19 06/02/19  Benjiman Core, MD     Allergies Tegretol [carbamazepine] and Caffeine  Family History  Problem Relation Age of Onset  . Alcohol abuse Mother   . Alcohol abuse Father     Social History Social History   Tobacco Use  . Smoking status: Former Smoker  Packs/day: 0.50    Types: Cigarettes  . Smokeless tobacco: Never Used  . Tobacco comment: unable to smoke while incarcerated 6+ months 02/13/20  Vaping Use  . Vaping Use: Never used  Substance Use Topics  . Alcohol use: Yes  . Drug use: Not Currently    Types: Marijuana, Methamphetamines    Comment: last used 04/10/2019    Review of Systems limited as patient is  uncooperative     ____________________________________________   PHYSICAL EXAM:  VITAL SIGNS: ED Triage Vitals  Enc Vitals Group     BP 10/06/20 2228 (!) 138/91     Pulse Rate 10/06/20 2228 (!) 118     Resp 10/06/20 2228 20     Temp 10/06/20 2228 (!) 97.5 F (36.4 C)     Temp Source 10/06/20 2228 Oral     SpO2 10/06/20 2228 96 %     Weight 10/06/20 2230 74 kg (163 lb 2.3 oz)     Height 10/06/20 2230 1.88 m (6\' 2" )     Head Circumference --      Peak Flow --      Pain Score 10/06/20 2230 0     Pain Loc --      Pain Edu? --      Excl. in GC? --     Constitutional: Alert, anxious, no acute distress Eyes: Conjunctivae are normal.  Head: Atraumatic. No hematoma or laceration Nose: No congestion/rhinnorhea. Mouth/Throat: Mucous membranes are moist.   Neck:  Painless ROM, no pain with axial load Cardiovascular: Tachycardia regular rhythm. Grossly normal heart sounds.  Good peripheral circulation. Respiratory: Normal respiratory effort.  No retractions. Gastrointestinal: Soft and nontender. No distention.  Musculoskeletal Warm and well perfused Neurologic:  Normal speech and language. No gross focal neurologic deficits are appreciated.  Skin:  Skin is warm, dry and intact. No rash noted.   ____________________________________________   LABS (all labs ordered are listed, but only abnormal results are displayed)  Labs Reviewed - No data to display ____________________________________________  EKG  None ____________________________________________  RADIOLOGY  None ____________________________________________   PROCEDURES  Procedure(s) performed: No  Procedures   Critical Care performed: No ____________________________________________   INITIAL IMPRESSION / ASSESSMENT AND PLAN / ED COURSE  Pertinent labs & imaging results that were available during my care of the patient were reviewed by me and considered in my medical decision making (see chart for  details).  Patient presents with altered mental status after alcohol and substance abuse.  Patient had lab work performed in the last 24 hours which was overall unremarkable. His presentation today is not markedly different.  We will give him IM Haldol to help calm him as he is requesting something to help him for feeling "wired "  He will need to be observed in the emergency department to ensure improvement/until sobriety    ____________________________________________   FINAL CLINICAL IMPRESSION(S) / ED DIAGNOSES  Final diagnoses:  Polysubstance abuse (HCC)        Note:  This document was prepared using Dragon voice recognition software and may include unintentional dictation errors.   10/08/20, MD 10/06/20 2240

## 2020-10-07 ENCOUNTER — Other Ambulatory Visit: Payer: Self-pay

## 2020-10-07 LAB — BASIC METABOLIC PANEL
Anion gap: 11 (ref 5–15)
BUN: 6 mg/dL (ref 6–20)
CO2: 24 mmol/L (ref 22–32)
Calcium: 8.4 mg/dL — ABNORMAL LOW (ref 8.9–10.3)
Chloride: 107 mmol/L (ref 98–111)
Creatinine, Ser: 0.54 mg/dL — ABNORMAL LOW (ref 0.61–1.24)
GFR, Estimated: >60 mL/min (ref >60)
Glucose, Bld: 96 mg/dL (ref 70–99)
Potassium: 3.9 mmol/L (ref 3.5–5.1)
Sodium: 142 mmol/L (ref 135–145)

## 2020-10-07 LAB — URINE DRUG SCREEN, QUALITATIVE (ARMC ONLY)
Amphetamines, Ur Screen: POSITIVE — AB
Barbiturates, Ur Screen: NOT DETECTED
Benzodiazepine, Ur Scrn: NOT DETECTED
Cannabinoid 50 Ng, Ur ~~LOC~~: NOT DETECTED
Cocaine Metabolite,Ur ~~LOC~~: NOT DETECTED
MDMA (Ecstasy)Ur Screen: NOT DETECTED
Methadone Scn, Ur: NOT DETECTED
Opiate, Ur Screen: NOT DETECTED
Phencyclidine (PCP) Ur S: NOT DETECTED

## 2020-10-07 LAB — CBC WITH DIFFERENTIAL/PLATELET
Abs Immature Granulocytes: 0 10*3/uL (ref 0.00–0.07)
Basophils Absolute: 0 10*3/uL (ref 0.0–0.1)
Basophils Relative: 1 %
Eosinophils Absolute: 0.1 10*3/uL (ref 0.0–0.5)
Eosinophils Relative: 2 %
HCT: 41.3 % (ref 39.0–52.0)
Hemoglobin: 14.1 g/dL (ref 13.0–17.0)
Immature Granulocytes: 0 %
Lymphocytes Relative: 35 %
Lymphs Abs: 1.5 10*3/uL (ref 0.7–4.0)
MCH: 33.5 pg (ref 26.0–34.0)
MCHC: 34.1 g/dL (ref 30.0–36.0)
MCV: 98.1 fL (ref 80.0–100.0)
Monocytes Absolute: 0.2 10*3/uL (ref 0.1–1.0)
Monocytes Relative: 5 %
Neutro Abs: 2.5 10*3/uL (ref 1.7–7.7)
Neutrophils Relative %: 57 %
Platelets: 237 10*3/uL (ref 150–400)
RBC: 4.21 MIL/uL — ABNORMAL LOW (ref 4.22–5.81)
RDW: 13.3 % (ref 11.5–15.5)
WBC: 4.3 10*3/uL (ref 4.0–10.5)
nRBC: 0 % (ref 0.0–0.2)

## 2020-10-07 LAB — ETHANOL: Alcohol, Ethyl (B): 165 mg/dL — ABNORMAL HIGH (ref <10)

## 2020-10-07 MED ORDER — LORAZEPAM 2 MG PO TABS: 0.0000 mg | ORAL_TABLET | Freq: Two times a day (BID) | ORAL | Status: DC

## 2020-10-07 MED ORDER — THIAMINE HCL 100 MG/ML IJ SOLN: 100.0000 mg | Freq: Every day | INTRAMUSCULAR | Status: DC

## 2020-10-07 MED ORDER — LORAZEPAM 2 MG/ML IJ SOLN: 0.0000 mg | Freq: Four times a day (QID) | INTRAMUSCULAR | Status: DC

## 2020-10-07 MED ORDER — THIAMINE HCL 100 MG PO TABS
100.0000 mg | ORAL_TABLET | Freq: Every day | ORAL | Status: DC
  Administered 2020-10-07: 100 mg via ORAL
  Filled 2020-10-07: qty 1

## 2020-10-07 MED ORDER — LORAZEPAM 2 MG/ML IJ SOLN: 0.0000 mg | Freq: Two times a day (BID) | INTRAMUSCULAR | Status: DC

## 2020-10-07 MED ORDER — ACETAMINOPHEN 500 MG PO TABS
1000.0000 mg | ORAL_TABLET | Freq: Four times a day (QID) | ORAL | Status: DC | PRN
  Administered 2020-10-07: 1000 mg via ORAL
  Filled 2020-10-07: qty 2

## 2020-10-07 MED ORDER — LORAZEPAM 2 MG/ML IJ SOLN
2.0000 mg | Freq: Once | INTRAMUSCULAR | Status: AC
  Administered 2020-10-07: 2 mg via INTRAVENOUS
  Filled 2020-10-07: qty 1

## 2020-10-07 MED ORDER — LORAZEPAM 2 MG PO TABS
0.0000 mg | ORAL_TABLET | Freq: Four times a day (QID) | ORAL | Status: DC
  Administered 2020-10-07: 1 mg via ORAL
  Administered 2020-10-07 – 2020-10-08 (×3): 2 mg via ORAL
  Filled 2020-10-07: qty 2
  Filled 2020-10-07 (×3): qty 1

## 2020-10-07 NOTE — ED Notes (Signed)
VOLUNTARY/awaiting detox placement

## 2020-10-07 NOTE — ED Notes (Signed)
Report received from Kathaleen Maser. RN. Patient arrived to bed 19H via wheelchair. Patient able to independently transfer to behavioral bed. Patient is lying with eyes closed at this time.

## 2020-10-07 NOTE — ED Provider Notes (Signed)
Start of----------------------------------------- 7:05 AM on 10/07/2020 -----------------------------------------  Blood pressure 121/80, pulse 97, temperature (!) 97.5 F (36.4 C), temperature source Oral, resp. rate 14, height 6\' 2"  (1.88 m), weight 74 kg, SpO2 95 %.  Assuming care from Dr. .  In short, Gary [name] is a 34 y.o. male with a chief complaint of Altered Mental Status .  Refer to the original H&P for additional details.  The current plan of care is to observe until patient clinically sober.  ----------------------------------------- 11:27 AM on 10/07/2020 -----------------------------------------  Patient is now awake and alert, currently complaining of alcohol withdrawal symptoms and requesting detox placement.  We will draw baseline labs and start patient on CIWA protocol.  TTS consult pending for potential detox.  ----------------------------------------- 1:59 PM on 10/07/2020 -----------------------------------------  Labs are unremarkable, patient's withdrawal symptoms are improved following start of CIWA protocol.    10/09/2020, MD 10/07/20 1400

## 2020-10-07 NOTE — BH Assessment (Signed)
Referral information for detox treatment faxed to:   [facility] [phone] or [phone])   [facility] [phone])   [facility] [phone])  . [place] [phone])   [facility] [phone])  . [place] [phone])

## 2020-10-07 NOTE — ED Notes (Signed)
Report given to Bill RN

## 2020-10-07 NOTE — BH Assessment (Signed)
Assessment Note  Gary [name] is an 34 y.o. male who presents to [facility] ED voluntarily for treatment. Per triage note, Per EMS pt found down on sidewalk outside [place]; pt states he drank 6 bottles of wine and smoked meth.   During TTS assessment pt presents calm, quiet, depressed, and oriented x 3, anxious but cooperative, and mood-congruent with affect. The pt does not appear to be responding to internal or external stimuli. Neither is the pt presenting with any delusional thinking. Pt verified the information provided to triage RN. Pt identifies his main complaint to be the need for detox. Pt reports drinking 3-4 beer daily for the last few years with a sobriety period of 9 months last year due to incarceration. Pt reports drinking only alcohol since his last discharge charted for (10/04/20). Pt admits to using meth "every once in while" but denies any use before admission. Pt reports to endorse withdrawals symptoms (tremors, dizziness, and body pains) and states, "most times I just can't drink enough to satisfy myself". Pt also tearfully reports an increase in anxiety since his boyfriend moved out of the home yesterday but denies feeling depressed. Pt reports endorsing AH only while intoxicated. Pt reports an INPT hx with [facility] and [facility] for MH/SA. Pt reports 2/3 admission this year to [facility] stating, "I love the staff bit the place is terrible and I do not want to go there". Pt denies a current MH diagnosis outside of SA or medication compliance with any psychiatric medications. Pt confirms HIV status and expressed the need to resume HIV medications. Pt reports a family hx of SA (Parents) and currently denies any SI/HI/AH/VH. Pt refuses to provide a collateral contact stating, "I'm not sure about that".   Pt will be observed overnight pending detox referral   Diagnosis: Alcohol use disorder    Past Medical History:  Past Medical History:  Diagnosis Date  . Alcohol abuse   . HIV (human  immunodeficiency virus infection) (HCC)   . Subdural hematoma [facility])     Past Surgical History:  Procedure Laterality Date  . INCISION AND DRAINAGE PERIRECTAL ABSCESS N/A 09/24/2016   Procedure: IRRIGATION AND DEBRIDEMENT PERIRECTAL ABSCESS;  Surgeon: Ricarda Frame, MD;  Location: [facility] ORS;  Service: General;  Laterality: N/A;  . none      Family History:  Family History  Problem Relation Age of Onset  . Alcohol abuse Mother   . Alcohol abuse Father     Social History:  reports that he has quit smoking. His smoking use included cigarettes. He smoked 0.50 packs per day. He has never used smokeless tobacco. He reports current alcohol use. He reports previous drug use. Drugs: Marijuana and Methamphetamines.  Additional Social History:  Alcohol / Drug Use Pain Medications: see mar Prescriptions: see mar Over the Counter: see mar History of alcohol / drug use?: Yes Substance #1 Name of Substance 1: Alcohol 1 - Amount (size/oz): 7 bottles of wine 1 - Frequency: daily 1 - Last Use / Amount: yesterday  CIWA: CIWA-Ar BP: 110/77 Pulse Rate: 75 Nausea and Vomiting: mild nausea with no vomiting Tactile Disturbances: none Tremor: no tremor Auditory Disturbances: mild harshness or ability to frighten Paroxysmal Sweats: no sweat visible Visual Disturbances: mild sensitivity Anxiety: mildly anxious Headache, Fullness in Head: mild Agitation: normal activity Orientation and Clouding of Sensorium: oriented and can do serial additions CIWA-Ar Total: 8 COWS:    Allergies:  Allergies  Allergen Reactions  . Tegretol [Carbamazepine] Other (See Comments)  Caused vertigo for 2 days after taking it  . Caffeine Palpitations    Home Medications: (Not in a hospital admission)   OB/GYN Status:  No LMP for male patient.  General Assessment Data Location of Assessment: [facility] ED TTS Assessment: In system Is this a Tele or Face-to-Face Assessment?: Face-to-Face Is this an Initial  Assessment or a Re-assessment for this encounter?: Initial Assessment Patient Accompanied by:: N/A Language Other than English: No Living Arrangements: Homeless/Shelter What gender do you identify as?: Male Date Telepsych consult ordered in [facility]: 10/06/20 Time Telepsych consult ordered in [facility]: 2217 Marital status: Long term relationship Maiden name: n/a Pregnancy Status: No Living Arrangements: Other (Comment) Can pt return to current living arrangement?: Yes Admission Status: Voluntary Is patient capable of signing voluntary admission?: Yes Referral Source: Self/Family/Friend Insurance type: None      Crisis Care Plan Living Arrangements: Other (Comment) Legal Guardian:  (self) Name of Psychiatrist: Reports of none Name of Therapist: Reports of none  Education Status Is patient currently in school?: No Is the patient employed, unemployed or receiving disability?: Unemployed  Risk to self with the past 6 months Suicidal Ideation: No-Not Currently/Within Last 6 Months Has patient been a risk to self within the past 6 months prior to admission? : Yes Suicidal Intent: No-Not Currently/Within Last 6 Months Has patient had any suicidal intent within the past 6 months prior to admission? : Yes Is patient at risk for suicide?: Yes Suicidal Plan?: No-Not Currently/Within Last 6 Months Has patient had any suicidal plan within the past 6 months prior to admission? : Yes Specify Current Suicidal Plan: drink himself death  Access to Means: Yes Specify Access to Suicidal Means: pt has access to alcohol  What has been your use of drugs/alcohol within the last 12 months?: pt only reports alcohol  Previous Attempts/Gestures: Yes How many times?: 2 Other Self Harm Risks: None reported  Triggers for Past Attempts: Spouse contact, Other (Comment) Intentional Self Injurious Behavior: None Family Suicide History: Unknown Recent stressful life event(s): Other (Comment), Conflict  (Comment) Persecutory voices/beliefs?: No Depression: Yes Depression Symptoms: Tearfulness Substance abuse history and/or treatment for substance abuse?: Yes Suicide prevention information given to non-admitted patients: Not applicable  Risk to Others within the past 6 months Homicidal Ideation: No Does patient have any lifetime risk of violence toward others beyond the six months prior to admission? : No Thoughts of Harm to Others: No Current Homicidal Intent: No Current Homicidal Plan: No Access to Homicidal Means: No Identified Victim: N/A History of harm to others?: No Assessment of Violence: None Noted Violent Behavior Description: N/A Does patient have access to weapons?: No Criminal Charges Pending?: Yes Describe Pending Criminal Charges: Pt refused to share ("I really don't want to talk about that") Does patient have a court date: Yes Court Date:  (Pt refused to shared ) Is patient on probation?: Unknown  Psychosis Hallucinations: Auditory Delusions: None noted  Mental Status Report Appearance/Hygiene: Unremarkable Eye Contact: Good Motor Activity: Freedom of movement Speech: Logical/coherent, Slurred, Soft Level of Consciousness: Quiet/awake, Crying Mood: Depressed, Anxious, Helpless Affect: Depressed, Sad, Anxious Anxiety Level: Moderate Thought Processes: Coherent, Relevant Judgement: Partial Orientation: Person, Place, Time, Situation, Appropriate for developmental age Obsessive Compulsive Thoughts/Behaviors: None  Cognitive Functioning Concentration: Good Memory: Recent Intact, Remote Intact Is patient IDD: No Insight: Poor Impulse Control: Fair Appetite: Good Have you had any weight changes? : No Change Sleep: No Change Total Hours of Sleep:  (Pt reports to be unsure ) Vegetative Symptoms: None  ADLScreening [facility] Assessment Services) Patient's cognitive ability adequate to safely complete daily activities?: Yes Patient able to express need for  assistance with ADLs?: Yes Independently performs ADLs?: Yes (appropriate for developmental age)  Prior Inpatient Therapy Prior Inpatient Therapy: Yes Prior Therapy Dates: 04/2019 Prior Therapy Facilty/Provider(s): [facility] Reason for Treatment: Major Depression/SA  Prior Outpatient Therapy Prior Outpatient Therapy: No Does patient have an ACCT team?: No Does patient have Intensive In-House Services?  : No Does patient have [facility] services? : No Does patient have P4CC services?: No  ADL Screening (condition at time of admission) Patient's cognitive ability adequate to safely complete daily activities?: Yes Is the patient deaf or have difficulty hearing?: No Does the patient have difficulty seeing, even when wearing glasses/contacts?: No Does the patient have difficulty concentrating, remembering, or making decisions?: No Patient able to express need for assistance with ADLs?: Yes Does the patient have difficulty dressing or bathing?: No Independently performs ADLs?: Yes (appropriate for developmental age) Does the patient have difficulty walking or climbing stairs?: No Weakness of Legs: None Weakness of Arms/Hands: None  Home Assistive Devices/Equipment Home Assistive Devices/Equipment: None  Therapy Consults (therapy consults require a physician order) PT Evaluation Needed: No OT Evalulation Needed: No SLP Evaluation Needed: No Abuse/Neglect Assessment (Assessment to be complete while patient is alone) Abuse/Neglect Assessment Can Be Completed: Yes Physical Abuse: Denies Verbal Abuse: Denies Sexual Abuse: Denies Exploitation of patient/patient's resources: Denies Self-Neglect: Denies Values / Beliefs Cultural Requests During Hospitalization: None Spiritual Requests During Hospitalization: None Consults Spiritual Care Consult Needed: No Transition of Care Team Consult Needed: No            Disposition:  Disposition Initial Assessment Completed for this  Encounter: Yes Patient referred to: Other (Comment) (Detox)  On Site Evaluation by:   Reviewed with Physician:    Opal Sidles 10/07/2020 2:05 PM

## 2020-10-08 DIAGNOSIS — F1014 Alcohol abuse with alcohol-induced mood disorder: Secondary | ICD-10-CM | POA: Diagnosis not present

## 2020-10-08 NOTE — BH Assessment (Signed)
Referral Check:   [facility] [phone] or [phone]) Sue Lush reports to call back at 10am after treatment team meeting    [facility] [phone]) Jaynie Crumble reports to call back after 9am for pt to complete screening    [facility] [phone]) Shanda Bumps reports to call back after 9am   . [facility] [phone]) Joselyn Glassman offered self pay rate $1000/day due to no insurance    [facility] [phone]) Cordelia Pen reports to call back after 9:30am to speak to [place]   . [place] [phone]) Deborah Chalk transferred to [place] at [phone], no answer

## 2020-10-08 NOTE — ED Notes (Signed)
Resumed c are from [employer].  Pt wanted to sign out ama.  Dr bradler aware. iv d'ced.  Pt calm and cooperative.

## 2020-10-08 NOTE — Consult Note (Signed)
[facility] Face-to-Face Psychiatry Consult   Reason for Consult:   Consult for this 34 year old man with alcohol abuse brought to the hospital intoxicated with unacceptable behavior in public Referring Physician: Su Hoff Patient Identification: Gary [name] MRN:  [id number] Principal Diagnosis: Alcohol abuse with alcohol-induced mood disorder (HCC) Diagnosis:  Principal Problem:   Alcohol abuse with alcohol-induced mood disorder (HCC) Active Problems:   HIV disease (HCC)   Alcohol abuse with intoxication (HCC)   Alcohol withdrawal (HCC)   Total Time spent with patient: 30 minutes  Subjective:   Gary [name] is a 34 y.o. male patient admitted with "I am not feeling good".  HPI: Patient seen chart reviewed.  Known from previous encounter.  34 year old man with alcohol abuse who has had multiple visits to the emergency room over the last few weeks.  He was brought in night before last intoxicated after once again being found acting bizarrely in public outside the [place].  Patient was belligerent and agitated in the emergency room but ultimately cooperative.  He has been sleeping it off since then.  On reevaluation today he no longer appears grossly intoxicated.  He is not trembling and not delirious and there is been no seizure activity.  He does admit that he feels anxious and dysphoric and sick to his stomach.  Denies suicidal ideation.  Denies acute psychosis.  Past Psychiatric History: Past history of multiple visits for alcohol intoxication as well as what appears to be a strong tendency to noncompliance with recommended treatment  Risk to Self: Suicidal Ideation: No-Not Currently/Within Last 6 Months Suicidal Intent: No-Not Currently/Within Last 6 Months Is patient at risk for suicide?: Yes Suicidal Plan?: No-Not Currently/Within Last 6 Months Specify Current Suicidal Plan: drink himself death  Access to Means: Yes Specify Access to Suicidal Means: pt has access to alcohol  What has  been your use of drugs/alcohol within the last 12 months?: pt only reports alcohol  How many times?: 2 Other Self Harm Risks: None reported  Triggers for Past Attempts: Spouse contact, Other (Comment) Intentional Self Injurious Behavior: None Risk to Others: Homicidal Ideation: No Thoughts of Harm to Others: No Current Homicidal Intent: No Current Homicidal Plan: No Access to Homicidal Means: No Identified Victim: N/A History of harm to others?: No Assessment of Violence: None Noted Violent Behavior Description: N/A Does patient have access to weapons?: No Criminal Charges Pending?: Yes Describe Pending Criminal Charges: Pt refused to share ("I really don't want to talk about that") Does patient have a court date: Yes Court Date:  (Pt refused to shared ) Prior Inpatient Therapy: Prior Inpatient Therapy: Yes Prior Therapy Dates: 04/2019 Prior Therapy Facilty/Provider(s): [facility] Reason for Treatment: Major Depression/SA Prior Outpatient Therapy: Prior Outpatient Therapy: No Does patient have an ACCT team?: No Does patient have Intensive In-House Services?  : No Does patient have [facility] services? : No Does patient have P4CC services?: No  Past Medical History:  Past Medical History:  Diagnosis Date  . Alcohol abuse   . HIV (human immunodeficiency virus infection) (HCC)   . Subdural hematoma [facility])     Past Surgical History:  Procedure Laterality Date  . INCISION AND DRAINAGE PERIRECTAL ABSCESS N/A 09/24/2016   Procedure: IRRIGATION AND DEBRIDEMENT PERIRECTAL ABSCESS;  Surgeon: Ricarda Frame, MD;  Location: [facility] ORS;  Service: General;  Laterality: N/A;  . none     Family History:  Family History  Problem Relation Age of Onset  . Alcohol abuse Mother   . Alcohol abuse Father  Family Psychiatric  History: See previous. Social History:  Social History   Substance and Sexual Activity  Alcohol Use Yes     Social History   Substance and Sexual Activity  Drug Use  Not Currently  . Types: Marijuana, Methamphetamines   Comment: last used 04/10/2019    Social History   Socioeconomic History  . Marital status: Single    Spouse name: Not on file  . Number of children: Not on file  . Years of education: Not on file  . Highest education level: Not on file  Occupational History  . Occupation: unemployed  Tobacco Use  . Smoking status: Former Smoker    Packs/day: 0.50    Types: Cigarettes  . Smokeless tobacco: Never Used  . Tobacco comment: unable to smoke while incarcerated 6+ months 02/13/20  Vaping Use  . Vaping Use: Never used  Substance and Sexual Activity  . Alcohol use: Yes  . Drug use: Not Currently    Types: Marijuana, Methamphetamines    Comment: last used 04/10/2019  . Sexual activity: Not Currently    Comment: unable to offer condoms while incarcerated  Other Topics Concern  . Not on file  Social History Narrative   "Currently living on the streets"   Independent at baseline   Social Determinants of Health   Financial Resource Strain:   . Difficulty of Paying Living Expenses: Not on file  Food Insecurity:   . Worried About Programme researcher, broadcasting/film/video in the Last Year: Not on file  . Ran Out of Food in the Last Year: Not on file  Transportation Needs:   . Lack of Transportation (Medical): Not on file  . Lack of Transportation (Non-Medical): Not on file  Physical Activity:   . Days of Exercise per Week: Not on file  . Minutes of Exercise per Session: Not on file  Stress:   . Feeling of Stress : Not on file  Social Connections:   . Frequency of Communication with Friends and Family: Not on file  . Frequency of Social Gatherings with Friends and Family: Not on file  . Attends Religious Services: Not on file  . Active Member of Clubs or Organizations: Not on file  . Attends Banker Meetings: Not on file  . Marital Status: Not on file   Additional Social History:    Allergies:   Allergies  Allergen Reactions  .  Tegretol [Carbamazepine] Other (See Comments)    Caused vertigo for 2 days after taking it  . Caffeine Palpitations    Labs:  Results for orders placed or performed during the hospital encounter of 10/06/20 (from the past 48 hour(s))  Urine Drug Screen, Qualitative     Status: Abnormal   Collection Time: 10/06/20 10:42 PM  Result Value Ref Range   Tricyclic, Ur Screen NO REAGENT AVALIABLE  NONE DETECTED   Amphetamines, Ur Screen POSITIVE (A) NONE DETECTED   MDMA (Ecstasy)Ur Screen NONE DETECTED NONE DETECTED   Cocaine Metabolite,Ur [place] NONE DETECTED NONE DETECTED   Opiate, Ur Screen NONE DETECTED NONE DETECTED   Phencyclidine (PCP) Ur S NONE DETECTED NONE DETECTED   Cannabinoid 50 Ng, Ur  NONE DETECTED NONE DETECTED   Barbiturates, Ur Screen NONE DETECTED NONE DETECTED   Benzodiazepine, Ur Scrn NONE DETECTED NONE DETECTED   Methadone Scn, Ur NONE DETECTED NONE DETECTED    Comment: (NOTE) Tricyclics + metabolites, urine    Cutoff 1000 ng/mL Amphetamines + metabolites, urine  Cutoff 1000 ng/mL MDMA (Ecstasy), urine  Cutoff 500 ng/mL Cocaine Metabolite, urine          Cutoff 300 ng/mL Opiate + metabolites, urine        Cutoff 300 ng/mL Phencyclidine (PCP), urine         Cutoff 25 ng/mL Cannabinoid, urine                 Cutoff 50 ng/mL Barbiturates + metabolites, urine  Cutoff 200 ng/mL Benzodiazepine, urine              Cutoff 200 ng/mL Methadone, urine                   Cutoff 300 ng/mL  The urine drug screen provides only a preliminary, unconfirmed analytical test result and should not be used for non-medical purposes. Clinical consideration and professional judgment should be applied to any positive drug screen result due to possible interfering substances. A more specific alternate chemical method must be used in order to obtain a confirmed analytical result. Gas chromatography / mass spectrometry (GC/MS) is the preferred confirm atory method. Performed at  [facility], [address] [place], [place] [address]   Basic metabolic panel     Status: Abnormal   Collection Time: 10/07/20 11:43 AM  Result Value Ref Range   Sodium 142 135 - 145 mmol/L   Potassium 3.9 3.5 - 5.1 mmol/L   Chloride 107 98 - 111 mmol/L   CO2 24 22 - 32 mmol/L   Glucose, Bld 96 70 - 99 mg/dL    Comment: Glucose reference range applies only to samples taken after fasting for at least 8 hours.   BUN 6 6 - 20 mg/dL   Creatinine, Ser 6.04 (L) 0.61 - 1.24 mg/dL   Calcium 8.4 (L) 8.9 - 10.3 mg/dL   GFR, Estimated >54 >09 mL/min    Comment: (NOTE) Calculated using the CKD-EPI Creatinine Equation (2021)    Anion gap 11 5 - 15    Comment: Performed at [facility], [address] [place], [place] [address]  CBC with Differential     Status: Abnormal   Collection Time: 10/07/20 11:43 AM  Result Value Ref Range   WBC 4.3 4.0 - 10.5 K/uL   RBC 4.21 (L) 4.22 - 5.81 MIL/uL   Hemoglobin 14.1 13.0 - 17.0 g/dL   HCT 47.8 39 - 52 %   MCV 98.1 80.0 - 100.0 fL   MCH 33.5 26.0 - 34.0 pg   MCHC 34.1 30.0 - 36.0 g/dL   RDW 29.5 62.1 - 30.8 %   Platelets 237 150 - 400 K/uL   nRBC 0.0 0.0 - 0.2 %   Neutrophils Relative % 57 %   Neutro Abs 2.5 1.7 - 7.7 K/uL   Lymphocytes Relative 35 %   Lymphs Abs 1.5 0.7 - 4.0 K/uL   Monocytes Relative 5 %   Monocytes Absolute 0.2 0.1 - 1.0 K/uL   Eosinophils Relative 2 %   Eosinophils Absolute 0.1 0.0 - 0.5 K/uL   Basophils Relative 1 %   Basophils Absolute 0.0 0.0 - 0.1 K/uL   Immature Granulocytes 0 %   Abs Immature Granulocytes 0.00 0.00 - 0.07 K/uL    Comment: Performed at [facility], [address] [place], [place] [address]  Ethanol     Status: Abnormal   Collection Time: 10/07/20 11:43 AM  Result Value Ref Range   Alcohol, Ethyl (B) 165 (H) <10 mg/dL    Comment: (NOTE) Lowest detectable limit  for serum alcohol is 10 mg/dL.  For medical purposes only. Performed at [facility], [address] [place], [place] [address]     Current Facility-Administered Medications  Medication Dose Route Frequency Provider Last Rate Last Admin  . acetaminophen (TYLENOL) tablet 1,000 mg  1,000 mg Oral Q6H PRN Arnaldo NatalMalinda, Paul F, MD   1,000 mg at 10/07/20 2139  . LORazepam (ATIVAN) injection 0-4 mg  0-4 mg Intravenous Q6H Chesley NoonJessup, Charles, MD       Or  . LORazepam (ATIVAN) tablet 0-4 mg  0-4 mg Oral Q6H Chesley NoonJessup, Charles, MD   2 mg at 10/07/20 2139  . [START ON 10/09/2020] LORazepam (ATIVAN) injection 0-4 mg  0-4 mg Intravenous Q12H Chesley NoonJessup, Charles, MD       Or  . Melene Muller[START ON 10/09/2020] LORazepam (ATIVAN) tablet 0-4 mg  0-4 mg Oral Q12H Chesley NoonJessup, Charles, MD      . thiamine tablet 100 mg  100 mg Oral Daily Chesley NoonJessup, Charles, MD   100 mg at 10/07/20 1141   Or  . thiamine (B-1) injection 100 mg  100 mg Intravenous Daily Chesley NoonJessup, Charles, MD       Current Outpatient Medications  Medication Sig Dispense Refill  . chlordiazePOXIDE (LIBRIUM) 25 MG capsule 25 mg PO TID x 1 day, then 25 mg PO BID X 1 day,  then 25  PO QD X 1 day 6 capsule 0  . elvitegravir-cobicistat-emtricitabine-tenofovir (GENVOYA) 150-150-200-10 MG TABS tablet Take 1 tablet by mouth daily with breakfast. 30 tablet 0  . FLUoxetine (PROZAC) 20 MG capsule Take 1 capsule (20 mg total) by mouth daily. 30 capsule 0  . nicotine (NICODERM CQ - DOSED IN MG/24 HOURS) 21 mg/24hr patch Place 1 patch (21 mg total) onto the skin daily. (Patient not taking: Reported on 06/07/2020) 28 patch 0  . pantoprazole (PROTONIX) 40 MG tablet Take 1 tablet (40 mg total) by mouth daily. 30 tablet 0  . sucralfate (CARAFATE) 1 GM/10ML suspension Take 10 mLs (1 g total) by mouth 4 (four) times daily -  with meals and at bedtime for 14 days. (Patient not taking: Reported on 06/07/2020) 560 mL 0    Musculoskeletal: Strength & Muscle Tone: within normal limits Gait & Station: normal Patient leans: N/A  Psychiatric Specialty Exam: Physical Exam Vitals and nursing  note reviewed.  Constitutional:      Appearance: He is well-developed.  HENT:     Head: Normocephalic and atraumatic.  Eyes:     Conjunctiva/sclera: Conjunctivae normal.     Pupils: Pupils are equal, round, and reactive to light.  Cardiovascular:     Heart sounds: Normal heart sounds.  Pulmonary:     Effort: Pulmonary effort is normal.  Abdominal:     Palpations: Abdomen is soft.  Musculoskeletal:        General: Normal range of motion.     Cervical back: Normal range of motion.  Skin:    General: Skin is warm and dry.  Neurological:     General: No focal deficit present.     Mental Status: He is alert.  Psychiatric:        Attention and Perception: He is inattentive.        Mood and Affect: Affect is blunt.        Speech: Speech is delayed.        Behavior: Behavior is slowed.        Thought Content: Thought content is not delusional. Thought content does not include homicidal  or suicidal ideation.        Cognition and Memory: Memory is impaired.        Judgment: Judgment is impulsive.     Review of Systems  Constitutional: Negative.   HENT: Negative.   Eyes: Negative.   Respiratory: Negative.   Cardiovascular: Negative.   Gastrointestinal: Negative.   Musculoskeletal: Negative.   Skin: Negative.   Neurological: Negative.   Psychiatric/Behavioral: Positive for behavioral problems, dysphoric mood and sleep disturbance. Negative for suicidal ideas.    Blood pressure 121/72, pulse 73, temperature 98.8 F (37.1 C), temperature source Oral, resp. rate 16, height 6\' 2"  (1.88 m), weight 74 kg, SpO2 98 %.Body mass index is 20.95 kg/m.  General Appearance: Disheveled  Eye Contact:  Minimal  Speech:  Slow  Volume:  Decreased  Mood:  Dysphoric  Affect:  Constricted  Thought Process:  Goal Directed  Orientation:  Full (Time, Place, and Person)  Thought Content:  Logical  Suicidal Thoughts:  No  Homicidal Thoughts:  No  Memory:  Immediate;   Fair Recent;   Poor Remote;    Fair  Judgement:  Impaired  Insight:  Shallow  Psychomotor Activity:  Decreased  Concentration:  Concentration: Poor  Recall:  of Knowledge:  Fair  Language:  Fair  Akathisia:  No  Handed:  Right  AIMS (if indicated):     Assets:  Desire for Improvement  ADL's:  Impaired  Cognition:  WNL  Sleep:        Treatment Plan Summary: Medication management and Plan Patient with heavy alcohol abuse and recurrent unacceptable behavior outside the hospital.  He is having frequent cycles of coming back in and seems to be unable to stop drinking for even a day.  Patient was advised today that he is doing himself great physical and mental harm and that we strongly recommend he agreed to inpatient rehab to try and break this cycle.  He is agreeable to speaking with [facility] on the phone for a possible intake.  Patient does not meet commitment criteria and if the admission to [facility] does not go through and we cannot find another disposition we will probably be forced to discharge him from the emergency room with referral to outpatient treatment.  Disposition: Patient does not meet criteria for psychiatric inpatient admission. Supportive therapy provided about ongoing stressors. Discussed crisis plan, support from social network, calling 911, coming to the Emergency Department, and calling Suicide Hotline.  [facility], MD 10/08/2020 10:55 AM

## 2020-10-08 NOTE — ED Notes (Signed)
Patient is asleep and resting comfortably. Vs were not obtain at this moment. 

## 2020-10-08 NOTE — BH Assessment (Signed)
Referral Check:   [facility] [phone] or [phone]) Left voicemail  [facility] [phone]) Pt completed screening; per Shayla denied due to need to completed detox with [facility] and restart HIV meds.   [employer] [phone]) Cordelia Pen offered self pay rate 1,2500/14days due to no insurance  .[facility] [phone]) Joselyn Glassman offered self pay rate $1000/day due to no insurance   [facility] [phone]) Mohammad request re-fax to [phone]; task completed at 11:25am   .[facility] [phone]) no answer  Pt refused RTS services.

## 2020-10-18 ENCOUNTER — Encounter: Payer: Self-pay | Admitting: *Deleted

## 2020-10-18 ENCOUNTER — Other Ambulatory Visit: Payer: Self-pay

## 2020-10-18 ENCOUNTER — Inpatient Hospital Stay
Admission: EM | Admit: 2020-10-18 | Discharge: 2020-10-19 | DRG: 894 | Discharge status: Against Medical Advice | Payer: Self-pay | Attending: Family Medicine | Admitting: Family Medicine

## 2020-10-18 DIAGNOSIS — Y908 Blood alcohol level of 240 mg/100 ml or more: Secondary | ICD-10-CM | POA: Diagnosis present

## 2020-10-18 DIAGNOSIS — F10139 Alcohol abuse with withdrawal, unspecified: Secondary | ICD-10-CM | POA: Diagnosis present

## 2020-10-18 DIAGNOSIS — Z20822 Contact with and (suspected) exposure to covid-19: Secondary | ICD-10-CM | POA: Diagnosis present

## 2020-10-18 DIAGNOSIS — Z21 Asymptomatic human immunodeficiency virus [HIV] infection status: Secondary | ICD-10-CM | POA: Diagnosis present

## 2020-10-18 DIAGNOSIS — F1910 Other psychoactive substance abuse, uncomplicated: Secondary | ICD-10-CM | POA: Diagnosis present

## 2020-10-18 DIAGNOSIS — F10239 Alcohol dependence with withdrawal, unspecified: Secondary | ICD-10-CM | POA: Diagnosis present

## 2020-10-18 DIAGNOSIS — I248 Other forms of acute ischemic heart disease: Secondary | ICD-10-CM | POA: Diagnosis present

## 2020-10-18 DIAGNOSIS — F1020 Alcohol dependence, uncomplicated: Secondary | ICD-10-CM | POA: Diagnosis present

## 2020-10-18 DIAGNOSIS — F10230 Alcohol dependence with withdrawal, uncomplicated: Principal | ICD-10-CM | POA: Diagnosis present

## 2020-10-18 DIAGNOSIS — Z5329 Procedure and treatment not carried out because of patient's decision for other reasons: Secondary | ICD-10-CM | POA: Diagnosis not present

## 2020-10-18 DIAGNOSIS — Z87891 Personal history of nicotine dependence: Secondary | ICD-10-CM

## 2020-10-18 DIAGNOSIS — Z811 Family history of alcohol abuse and dependence: Secondary | ICD-10-CM

## 2020-10-18 DIAGNOSIS — R778 Other specified abnormalities of plasma proteins: Secondary | ICD-10-CM

## 2020-10-18 DIAGNOSIS — F10930 Alcohol use, unspecified with withdrawal, uncomplicated: Secondary | ICD-10-CM

## 2020-10-18 DIAGNOSIS — B20 Human immunodeficiency virus [HIV] disease: Secondary | ICD-10-CM | POA: Diagnosis present

## 2020-10-18 DIAGNOSIS — F1510 Other stimulant abuse, uncomplicated: Secondary | ICD-10-CM | POA: Diagnosis present

## 2020-10-18 DIAGNOSIS — R7989 Other specified abnormal findings of blood chemistry: Secondary | ICD-10-CM

## 2020-10-18 DIAGNOSIS — R079 Chest pain, unspecified: Secondary | ICD-10-CM

## 2020-10-18 LAB — RESP PANEL BY RT-PCR (FLU A&B, COVID) ARPGX2
Influenza A by PCR: NEGATIVE
Influenza B by PCR: NEGATIVE
SARS Coronavirus 2 by RT PCR: NEGATIVE

## 2020-10-18 MED ORDER — LORAZEPAM 2 MG PO TABS: 0.0000 mg | ORAL_TABLET | Freq: Two times a day (BID) | ORAL | Status: DC

## 2020-10-18 MED ORDER — LORAZEPAM 2 MG/ML IJ SOLN: 0.0000 mg | Freq: Two times a day (BID) | INTRAMUSCULAR | Status: DC

## 2020-10-18 MED ORDER — THIAMINE HCL 100 MG/ML IJ SOLN
100.0000 mg | Freq: Every day | INTRAMUSCULAR | Status: DC
  Filled 2020-10-18: qty 2

## 2020-10-18 MED ORDER — THIAMINE HCL 100 MG PO TABS
100.0000 mg | ORAL_TABLET | Freq: Every day | ORAL | Status: DC
  Administered 2020-10-18 – 2020-10-19 (×2): 100 mg via ORAL
  Filled 2020-10-18: qty 1

## 2020-10-18 MED ORDER — LORAZEPAM 2 MG/ML IJ SOLN
0.0000 mg | Freq: Four times a day (QID) | INTRAMUSCULAR | Status: DC
  Administered 2020-10-19: 2 mg via INTRAVENOUS
  Filled 2020-10-18: qty 1

## 2020-10-18 MED ORDER — LORAZEPAM 2 MG PO TABS
0.0000 mg | ORAL_TABLET | Freq: Four times a day (QID) | ORAL | Status: DC
  Administered 2020-10-18: 2 mg via ORAL
  Filled 2020-10-18: qty 1

## 2020-10-18 NOTE — ED Notes (Signed)
Unable to obtain labs in triage, pt pulled back when NT was trying to obtain labs. Pt is anxious, appears paranoid, pacing around at times in triage.

## 2020-10-18 NOTE — ED Triage Notes (Signed)
First Nurse: patient brought in by ems from home. Patient here for alcohol withdrawal. Per ems patient's last drink was prior to their arrival. EMS reported that patient was not cooperative and not answering questions. bllod pressure for ems 147/107.

## 2020-10-18 NOTE — ED Notes (Signed)
NT Attempted to draw blood without success.

## 2020-10-18 NOTE — ED Notes (Signed)
Pt. Transferred from Triage to room [facility] after dressing out and screening for contraband. Report to include Situation, Background, Assessment and Recommendations from [facility]. Pt. Oriented to [facility] including Q15 minute rounds as well as Psychologist, counselling for their protection. Patient is alert and oriented, warm and dry in no acute distress. Patient denies SI, HI, and AVH. Pt. Encouraged to let me know if needs arise.

## 2020-10-18 NOTE — ED Triage Notes (Signed)
Pt says he is here with "extreme alcohol withdrawls". Says that he is anxious. Last drink just PTA. Drinks 5 bottles of wine everyday. Wants help with his ETOH. Says he tried to "drink myself to death" yesterday. Reports he "will hurt myself if I go home". Pt continually says in triage " I will not hurt the ladies, the men I will hurt, I feel violent"

## 2020-10-18 NOTE — ED Provider Notes (Signed)
[facility] Emergency Department Provider Note   ____________________________________________   Event Date/Time   First MD Initiated Contact with Patient 10/18/20 2102     (approximate)  I have reviewed the triage vital signs and the nursing notes.   HISTORY  Chief Complaint Alcohol Problem    HPI Gary [name] is a 34 y.o. male who presents voluntarily via EMS for alcohol withdrawal.  Patient states "I drink 5 bottles of wine every day and I just do not want to anymore".  Patient states that his last drink was immediately prior to arrival.  Patient not answering any other questions at this time until "I get some fucking help"         Past Medical History:  Diagnosis Date  . Alcohol abuse   . HIV (human immunodeficiency virus infection) (HCC)   . Subdural hematoma [facility])     Patient Active Problem List   Diagnosis Date Noted  . Alcohol abuse   . Alcohol poisoning   . Suicidal ideation 06/14/2020  . Pancreatitis 06/13/2020  . 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  . Malnutrition of moderate degree 05/07/2020  . Gastrointestinal hemorrhage   . Alcoholic hepatitis without ascites   . Melena 05/05/2020  . Fever 05/05/2020  . Alcoholic ketoacidosis 05/05/2020  . Alcohol withdrawal (HCC) 04/17/2020  . Thrombocytopenia (HCC) 07/25/2019  . Transaminitis 07/25/2019  . Diarrhea 07/25/2019  . Traumatic subdural hematoma (HCC) 07/02/2019  . Alcohol abuse with intoxication (HCC) 07/02/2019  . Alcohol abuse with alcohol-induced mood disorder (HCC) 05/13/2019  . Alcohol dependence (HCC) 04/16/2019  . 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 HIV disease (HCC) 06/16/2018  . Polysubstance abuse (HCC) 06/16/2018  . Cigarette smoker 06/16/2018    Past Surgical History:  Procedure Laterality Date  . INCISION AND DRAINAGE PERIRECTAL ABSCESS N/A 09/24/2016   Procedure: IRRIGATION AND  DEBRIDEMENT PERIRECTAL ABSCESS;  Surgeon: Ricarda Frame, MD;  Location: [facility] ORS;  Service: General;  Laterality: N/A;  . none      Prior to Admission medications   Medication Sig Start Date End Date Taking? Authorizing Provider  chlordiazePOXIDE (LIBRIUM) 25 MG capsule 25 mg PO TID x 1 day, then 25 mg PO BID X 1 day,  then 25  PO QD X 1 day 09/28/20   Gilles Chiquito, MD  elvitegravir-cobicistat-emtricitabine-tenofovir (GENVOYA) 150-150-200-10 MG TABS tablet Take 1 tablet by mouth daily with breakfast. 09/28/20   Clapacs, Jackquline Denmark, MD  FLUoxetine (PROZAC) 20 MG capsule Take 1 capsule (20 mg total) by mouth daily. 09/27/20 10/27/20  Clapacs, Jackquline Denmark, MD  nicotine (NICODERM CQ - DOSED IN MG/24 HOURS) 21 mg/24hr patch Place 1 patch (21 mg total) onto the skin daily. Patient not taking: Reported on 06/07/2020 05/09/20   Burnadette Pop, MD  pantoprazole (PROTONIX) 40 MG tablet Take 1 tablet (40 mg total) by mouth daily. 09/27/20   Clapacs, Jackquline Denmark, MD  sucralfate (CARAFATE) 1 GM/10ML suspension Take 10 mLs (1 g total) by mouth 4 (four) times daily -  with meals and at bedtime for 14 days. Patient not taking: Reported on 06/07/2020 05/09/20 06/07/20  Burnadette Pop, MD  famotidine (PEPCID) 20 MG tablet Take 1 tablet (20 mg total) by mouth 2 (two) times daily. Patient not taking: Reported on 06/01/2019 05/14/19 06/02/19  Benjiman Core, MD    Allergies Tegretol [carbamazepine] and Caffeine  Family History  Problem Relation Age of Onset  . Alcohol abuse Mother   .  Alcohol abuse Father     Social History Social History   Tobacco Use  . Smoking status: Former Smoker    Packs/day: 0.50    Types: Cigarettes  . Smokeless tobacco: Never Used  . Tobacco comment: unable to smoke while incarcerated 6+ months 02/13/20  Vaping Use  . Vaping Use: Never used  Substance Use Topics  . Alcohol use: Yes  . Drug use: Not Currently    Types: Marijuana, Methamphetamines    Comment: last used 04/10/2019     Review of Systems Unable to assess ____________________________________________   PHYSICAL EXAM:  VITAL SIGNS: ED Triage Vitals  Enc Vitals Group     BP 10/18/20 2041 (!) 128/94     Pulse Rate 10/18/20 2041 (!) 123     Resp 10/18/20 2041 18     Temp 10/18/20 2041 98.6 F (37 C)     Temp Source 10/18/20 2041 Oral     SpO2 10/18/20 2041 100 %     Weight --      Height --      Head Circumference --      Peak Flow --      Pain Score 10/18/20 2042 10     Pain Loc --      Pain Edu? --      Excl. in GC? --    Constitutional: Alert and oriented. Well appearing and in no acute distress. Eyes: Conjunctivae are normal. PERRL. Head: Atraumatic. Nose: No congestion/rhinnorhea. Mouth/Throat: Mucous membranes are moist. Neck: No stridor Cardiovascular: Grossly normal heart sounds.  Good peripheral circulation. Respiratory: Normal respiratory effort.  No retractions. Gastrointestinal: Soft and nontender. No distention. Musculoskeletal: No obvious deformities Neurologic:  Normal speech and language. No gross focal neurologic deficits are appreciated. Skin:  Skin is warm and dry. No rash noted. Psychiatric: Mood is labile and affect is agitated. Speech is normal.  Uncooperative  ____________________________________________   LABS (all labs ordered are listed, but only abnormal results are displayed)  Labs Reviewed  RESP PANEL BY RT-PCR (FLU A&B, COVID) ARPGX2  CBC  COMPREHENSIVE METABOLIC PANEL  ETHANOL  SALICYLATE LEVEL  ACETAMINOPHEN LEVEL  URINE DRUG SCREEN, QUALITATIVE ([facility] ONLY)  TROPONIN I (HIGH SENSITIVITY)   ____________________________________________  EKG  ED ECG REPORT I, Merwyn Katos, the attending physician, personally viewed and interpreted this ECG.  Date: 10/18/2020 EKG Time: 2142 Rate: 125 Rhythm: Tachycardic sinus rhythm QRS Axis: normal Intervals: normal ST/T Wave abnormalities: normal Narrative Interpretation: no evidence of acute  ischemia   PROCEDURES  Procedure(s) performed (including Critical Care):  Procedures   ____________________________________________   INITIAL IMPRESSION / ASSESSMENT AND PLAN / ED COURSE  As part of my medical decision making, I reviewed the following data within the electronic MEDICAL RECORD NUMBER Nursing notes reviewed and incorporated, Labs reviewed, EKG interpreted, Old chart reviewed, and Notes from prior ED visits reviewed and incorporated       + recent reduction in alcohol use from chronic abuse + anxiety +nausea + tachycardia + tremor + diaphoresis AAOx3. Unknown history of withdrawal seizures, ICU admissions, DTs.  Workup: Including but not limited to POCT glucose, CBC, BMP Interventions: Ativan 2mg  q55min PRN withdrawal symptoms for 3-4 doses and reassess disposition.  Given History, Exam, and Workup this patient appears to be suffering from alcohol withdrawal. Presentation not consistent with infectious etiology, thyrotoxicosis, Coingestion toxidrome/withdrawal, primary psychologic disorder such as anxiety, Severe Metabolic Derangement (hypoglycemia, alcoholic ketoacidosis, or other e-lyte abnormality).  Dispo: Pending psychiatric and social work evaluation  ____________________________________________   FINAL CLINICAL IMPRESSION(S) / ED DIAGNOSES  Final diagnoses:  Alcohol withdrawal syndrome without complication [facility])     ED Discharge Orders    None       Note:  This document was prepared using Dragon voice recognition software and may include unintentional dictation errors.   Merwyn Katos, MD 10/19/20 [phone]

## 2020-10-18 NOTE — ED Notes (Signed)
Hourly rounding reveals patient in room. No complaints, stable, in no acute distress. Q15 minute rounds and monitoring via Rover and Officer to continue.   

## 2020-10-18 NOTE — BH Assessment (Signed)
Assessment Note  Gary [name] is an 34 y.o. male presenting to [facility] ED voluntarily seeking detox treatment for his alcohol abuse. Per triage note patient brought in by ems from home. Patient here for alcohol withdrawal. Per ems patient's last drink was prior to their arrival. Patient reported that he is currently drinking "5 bottles a day" of alcohol. Patient reports he has been drinking this heavily "the last few months" but was unable to report an exact time frame. Patient reports current withdrawal symptoms as "cold chills, diarrhea, anxiety." Patient's mood was cooperative, anxious and depressed, patient was alert and oriented x4. Patient denies current SI/HI/AH/VH and does not appear to be responding to any internal or external stimuli.   Patient to be referred for detox treatment  Diagnosis: Alcohol Use Disorder, Severe  Past Medical History:  Past Medical History:  Diagnosis Date  . Alcohol abuse   . HIV (human immunodeficiency virus infection) (HCC)   . Subdural hematoma [facility])     Past Surgical History:  Procedure Laterality Date  . INCISION AND DRAINAGE PERIRECTAL ABSCESS N/A 09/24/2016   Procedure: IRRIGATION AND DEBRIDEMENT PERIRECTAL ABSCESS;  Surgeon: Ricarda Frame, MD;  Location: [facility] ORS;  Service: General;  Laterality: N/A;  . none      Family History:  Family History  Problem Relation Age of Onset  . Alcohol abuse Mother   . Alcohol abuse Father     Social History:  reports that he has quit smoking. His smoking use included cigarettes. He smoked 0.50 packs per day. He has never used smokeless tobacco. He reports current alcohol use. He reports previous drug use. Drugs: Marijuana and Methamphetamines.  Additional Social History:  Alcohol / Drug Use Pain Medications: See MAR Prescriptions: See MAR Over the Counter: See MAR History of alcohol / drug use?: Yes Substance #1 Name of Substance 1: Alcohol  CIWA: CIWA-Ar BP: (!) 128/94 Pulse Rate: (!)  123 Nausea and Vomiting: 2 Tactile Disturbances: very mild itching, pins and needles, burning or numbness Tremor: two Auditory Disturbances: very mild harshness or ability to frighten Paroxysmal Sweats: two Visual Disturbances: not present Anxiety: two Headache, Fullness in Head: none present Agitation: normal activity Orientation and Clouding of Sensorium: oriented and can do serial additions CIWA-Ar Total: 10 COWS:    Allergies:  Allergies  Allergen Reactions  . Tegretol [Carbamazepine] Other (See Comments)    Caused vertigo for 2 days after taking it  . Caffeine Palpitations    Home Medications: (Not in a hospital admission)   OB/GYN Status:  No LMP for male patient.  General Assessment Data Location of Assessment: [facility] ED TTS Assessment: In system Is this a Tele or Face-to-Face Assessment?: Face-to-Face Is this an Initial Assessment or a Re-assessment for this encounter?: Initial Assessment Patient Accompanied by:: N/A Language Other than English: No Living Arrangements: Homeless/Shelter What gender do you identify as?: Male Marital status: Long term relationship Pregnancy Status: No Living Arrangements: Other (Comment) Can pt return to current living arrangement?: Yes Admission Status: Voluntary Is patient capable of signing voluntary admission?: Yes Referral Source: Self/Family/Friend Insurance type: None  Medical Screening Exam [facility] Walk-in ONLY) Medical Exam completed: Yes  Crisis Care Plan Living Arrangements: Other (Comment) Legal Guardian: Other: (Self) Name of Psychiatrist: NOne Name of Therapist: None  Education Status Is patient currently in school?: No Is the patient employed, unemployed or receiving disability?: Unemployed  Risk to self with the past 6 months Suicidal Ideation: No Has patient been a risk to self within the  past 6 months prior to admission? : No Suicidal Intent: No Has patient had any suicidal intent within the past 6  months prior to admission? : No Is patient at risk for suicide?: No Suicidal Plan?: No Has patient had any suicidal plan within the past 6 months prior to admission? : No Specify Current Suicidal Plan: NOne Access to Means: No Specify Access to Suicidal Means: None What has been your use of drugs/alcohol within the last 12 months?: Alcohol abuse Previous Attempts/Gestures: Yes How many times?: 2 Other Self Harm Risks: None Triggers for Past Attempts: Spouse contact,Other (Comment) Intentional Self Injurious Behavior: None Family Suicide History: Unknown Recent stressful life event(s): Financial Problems Persecutory voices/beliefs?: No Depression: Yes Depression Symptoms: Guilt,Tearfulness,Isolating Substance abuse history and/or treatment for substance abuse?: No Suicide prevention information given to non-admitted patients: Not applicable  Risk to Others within the past 6 months Homicidal Ideation: No Does patient have any lifetime risk of violence toward others beyond the six months prior to admission? : No Thoughts of Harm to Others: No Current Homicidal Intent: No Current Homicidal Plan: No Access to Homicidal Means: No Identified Victim: None History of harm to others?: No Assessment of Violence: None Noted Violent Behavior Description: None Does patient have access to weapons?: No Criminal Charges Pending?: Yes Describe Pending Criminal Charges: Patient refused to discuss Does patient have a court date: No Is patient on probation?: No  Psychosis Hallucinations: None noted Delusions: None noted  Mental Status Report Appearance/Hygiene: Unremarkable Eye Contact: Fair Motor Activity: Freedom of movement Speech: Logical/coherent,Slurred Level of Consciousness: Alert,Irritable Mood: Anxious Affect: Anxious,Depressed Anxiety Level: Severe Thought Processes: Coherent Judgement: Partial Orientation: Person,Place,Time,Situation,Appropriate for developmental  age Obsessive Compulsive Thoughts/Behaviors: None  Cognitive Functioning Concentration: Normal Memory: Recent Intact,Remote Intact Is patient IDD: No Insight: Poor Impulse Control: Fair Appetite: Poor Have you had any weight changes? : No Change Sleep: Decreased Total Hours of Sleep: 0 Vegetative Symptoms: None  ADLScreening [facility] Assessment Services) Patient's cognitive ability adequate to safely complete daily activities?: Yes Patient able to express need for assistance with ADLs?: Yes Independently performs ADLs?: Yes (appropriate for developmental age)  Prior Inpatient Therapy Prior Inpatient Therapy: Yes Prior Therapy Dates: 04/2019 Prior Therapy Facilty/Provider(s): [facility] Reason for Treatment: Major Depression/SA  Prior Outpatient Therapy Prior Outpatient Therapy: No Does patient have an ACCT team?: No Does patient have Intensive In-House Services?  : No Does patient have [facility] services? : No Does patient have P4CC services?: No  ADL Screening (condition at time of admission) Patient's cognitive ability adequate to safely complete daily activities?: Yes Is the patient deaf or have difficulty hearing?: No Does the patient have difficulty seeing, even when wearing glasses/contacts?: No Does the patient have difficulty concentrating, remembering, or making decisions?: No Patient able to express need for assistance with ADLs?: Yes Does the patient have difficulty dressing or bathing?: No Independently performs ADLs?: Yes (appropriate for developmental age) Does the patient have difficulty walking or climbing stairs?: No Weakness of Legs: None Weakness of Arms/Hands: None  Home Assistive Devices/Equipment Home Assistive Devices/Equipment: None  Therapy Consults (therapy consults require a physician order) PT Evaluation Needed: No OT Evalulation Needed: No SLP Evaluation Needed: No Abuse/Neglect Assessment (Assessment to be complete while patient is  alone) Abuse/Neglect Assessment Can Be Completed: Yes Physical Abuse: Denies Verbal Abuse: Denies Sexual Abuse: Denies Exploitation of patient/patient's resources: Denies Self-Neglect: Denies Values / Beliefs Cultural Requests During Hospitalization: None Spiritual Requests During Hospitalization: None Consults Spiritual Care Consult Needed: No Transition of  Care Team Consult Needed: No            Disposition: Patient to be referred for detox treatment Disposition Initial Assessment Completed for this Encounter: Yes Patient referred to: [facility],Other (Comment) ([facility])  On Site Evaluation by:   Reviewed with Physician:    Benay Pike MS LCASA 10/18/2020 9:53 PM

## 2020-10-19 ENCOUNTER — Emergency Department
Admission: EM | Admit: 2020-10-19 | Discharge: 2020-11-09 | Disposition: A | Discharge status: Home / Self Care | Payer: Self-pay | Attending: Emergency Medicine | Admitting: Emergency Medicine

## 2020-10-19 DIAGNOSIS — F10139 Alcohol abuse with withdrawal, unspecified: Secondary | ICD-10-CM | POA: Diagnosis present

## 2020-10-19 DIAGNOSIS — F1094 Alcohol use, unspecified with alcohol-induced mood disorder: Secondary | ICD-10-CM | POA: Diagnosis present

## 2020-10-19 DIAGNOSIS — Z21 Asymptomatic human immunodeficiency virus [HIV] infection status: Secondary | ICD-10-CM | POA: Diagnosis present

## 2020-10-19 DIAGNOSIS — W19XXXA Unspecified fall, initial encounter: Secondary | ICD-10-CM | POA: Insufficient documentation

## 2020-10-19 DIAGNOSIS — Z20822 Contact with and (suspected) exposure to covid-19: Secondary | ICD-10-CM | POA: Insufficient documentation

## 2020-10-19 DIAGNOSIS — F10920 Alcohol use, unspecified with intoxication, uncomplicated: Secondary | ICD-10-CM

## 2020-10-19 DIAGNOSIS — R45851 Suicidal ideations: Secondary | ICD-10-CM | POA: Insufficient documentation

## 2020-10-19 DIAGNOSIS — F10239 Alcohol dependence with withdrawal, unspecified: Secondary | ICD-10-CM

## 2020-10-19 DIAGNOSIS — Z87891 Personal history of nicotine dependence: Secondary | ICD-10-CM | POA: Insufficient documentation

## 2020-10-19 DIAGNOSIS — R778 Other specified abnormalities of plasma proteins: Secondary | ICD-10-CM

## 2020-10-19 DIAGNOSIS — Z79899 Other long term (current) drug therapy: Secondary | ICD-10-CM | POA: Insufficient documentation

## 2020-10-19 DIAGNOSIS — B20 Human immunodeficiency virus [HIV] disease: Secondary | ICD-10-CM | POA: Diagnosis present

## 2020-10-19 DIAGNOSIS — S60519A Abrasion of unspecified hand, initial encounter: Secondary | ICD-10-CM | POA: Insufficient documentation

## 2020-10-19 DIAGNOSIS — F1510 Other stimulant abuse, uncomplicated: Secondary | ICD-10-CM | POA: Insufficient documentation

## 2020-10-19 DIAGNOSIS — F1010 Alcohol abuse, uncomplicated: Secondary | ICD-10-CM | POA: Diagnosis present

## 2020-10-19 DIAGNOSIS — Y92481 Parking lot as the place of occurrence of the external cause: Secondary | ICD-10-CM | POA: Insufficient documentation

## 2020-10-19 DIAGNOSIS — F1014 Alcohol abuse with alcohol-induced mood disorder: Secondary | ICD-10-CM | POA: Insufficient documentation

## 2020-10-19 LAB — URINE DRUG SCREEN, QUALITATIVE (ARMC ONLY)
Amphetamines, Ur Screen: POSITIVE — AB
Barbiturates, Ur Screen: NOT DETECTED
Benzodiazepine, Ur Scrn: POSITIVE — AB
Cannabinoid 50 Ng, Ur ~~LOC~~: NOT DETECTED
Cocaine Metabolite,Ur ~~LOC~~: NOT DETECTED
MDMA (Ecstasy)Ur Screen: NOT DETECTED
Methadone Scn, Ur: NOT DETECTED
Opiate, Ur Screen: NOT DETECTED
Phencyclidine (PCP) Ur S: NOT DETECTED
Tricyclic, Ur Screen: NOT DETECTED

## 2020-10-19 LAB — PHOSPHORUS: Phosphorus: 3.7 mg/dL (ref 2.5–4.6)

## 2020-10-19 LAB — COMPREHENSIVE METABOLIC PANEL
ALT: 40 U/L (ref 0–44)
AST: 54 U/L — ABNORMAL HIGH (ref 15–41)
Albumin: 4.4 g/dL (ref 3.5–5.0)
Alkaline Phosphatase: 108 U/L (ref 38–126)
Anion gap: 14 (ref 5–15)
BUN: 6 mg/dL (ref 6–20)
CO2: 25 mmol/L (ref 22–32)
Calcium: 8.9 mg/dL (ref 8.9–10.3)
Chloride: 100 mmol/L (ref 98–111)
Creatinine, Ser: 0.64 mg/dL (ref 0.61–1.24)
GFR, Estimated: >60 mL/min (ref >60)
Glucose, Bld: 111 mg/dL — ABNORMAL HIGH (ref 70–99)
Potassium: 3.6 mmol/L (ref 3.5–5.1)
Sodium: 139 mmol/L (ref 135–145)
Total Bilirubin: 0.6 mg/dL (ref 0.3–1.2)
Total Protein: 9 g/dL — ABNORMAL HIGH (ref 6.5–8.1)

## 2020-10-19 LAB — ACETAMINOPHEN LEVEL: Acetaminophen (Tylenol), Serum: <10 ug/mL — ABNORMAL LOW (ref 10–30)

## 2020-10-19 LAB — CBC
HCT: 45.9 % (ref 39.0–52.0)
Hemoglobin: 16.4 g/dL (ref 13.0–17.0)
MCH: 33.9 pg (ref 26.0–34.0)
MCHC: 35.7 g/dL (ref 30.0–36.0)
MCV: 94.8 fL (ref 80.0–100.0)
Platelets: 224 10*3/uL (ref 150–400)
RBC: 4.84 MIL/uL (ref 4.22–5.81)
RDW: 13.1 % (ref 11.5–15.5)
WBC: 5.3 10*3/uL (ref 4.0–10.5)
nRBC: 0 % (ref 0.0–0.2)

## 2020-10-19 LAB — TROPONIN I (HIGH SENSITIVITY)
Troponin I (High Sensitivity): 44 ng/L — ABNORMAL HIGH (ref <18)
Troponin I (High Sensitivity): 54 ng/L — ABNORMAL HIGH (ref <18)

## 2020-10-19 LAB — SALICYLATE LEVEL: Salicylate Lvl: <7 mg/dL — ABNORMAL LOW (ref 7.0–30.0)

## 2020-10-19 LAB — ETHANOL: Alcohol, Ethyl (B): 340 mg/dL (ref <10)

## 2020-10-19 LAB — MAGNESIUM: Magnesium: 2.1 mg/dL (ref 1.7–2.4)

## 2020-10-19 MED ORDER — ENOXAPARIN SODIUM 40 MG/0.4ML ~~LOC~~ SOLN
40.0000 mg | SUBCUTANEOUS | Status: DC
  Filled 2020-10-19: qty 0.4

## 2020-10-19 MED ORDER — THIAMINE HCL 100 MG/ML IJ SOLN
100.0000 mg | Freq: Every day | INTRAMUSCULAR | Status: DC
  Administered 2020-10-19: 100 mg via INTRAVENOUS

## 2020-10-19 MED ORDER — FOLIC ACID 1 MG PO TABS
1.0000 mg | ORAL_TABLET | Freq: Every day | ORAL | Status: DC
  Administered 2020-10-19: 1 mg via ORAL
  Filled 2020-10-19: qty 1

## 2020-10-19 MED ORDER — LORAZEPAM 1 MG PO TABS
1.0000 mg | ORAL_TABLET | ORAL | Status: DC | PRN
Start: 2020-10-19 — End: 2020-10-19

## 2020-10-19 MED ORDER — ACETAMINOPHEN 650 MG RE SUPP: 650.0000 mg | Freq: Four times a day (QID) | RECTAL | Status: DC | PRN

## 2020-10-19 MED ORDER — CHLORDIAZEPOXIDE HCL 25 MG PO CAPS
25.0000 mg | ORAL_CAPSULE | Freq: Three times a day (TID) | ORAL | Status: DC
  Administered 2020-10-19: 25 mg via ORAL
  Filled 2020-10-19: qty 1

## 2020-10-19 MED ORDER — LORAZEPAM 2 MG/ML IJ SOLN
1.0000 mg | Freq: Once | INTRAMUSCULAR | Status: AC
  Administered 2020-10-19: 1 mg via INTRAVENOUS
  Filled 2020-10-19: qty 1

## 2020-10-19 MED ORDER — ONDANSETRON HCL 4 MG PO TABS: 4.0000 mg | ORAL_TABLET | Freq: Four times a day (QID) | ORAL | Status: DC | PRN

## 2020-10-19 MED ORDER — ADULT MULTIVITAMIN W/MINERALS CH
1.0000 | ORAL_TABLET | Freq: Every day | ORAL | Status: DC
  Administered 2020-10-19: 1 via ORAL
  Filled 2020-10-19: qty 1

## 2020-10-19 MED ORDER — LORAZEPAM 2 MG/ML IJ SOLN
1.0000 mg | INTRAMUSCULAR | Status: DC | PRN
Start: 2020-10-19 — End: 2020-10-19
  Administered 2020-10-19 (×3): 2 mg via INTRAVENOUS
  Administered 2020-10-19: 3 mg via INTRAVENOUS
  Filled 2020-10-19 (×2): qty 1
  Filled 2020-10-19: qty 2

## 2020-10-19 MED ORDER — ONDANSETRON HCL 4 MG/2ML IJ SOLN
4.0000 mg | Freq: Four times a day (QID) | INTRAMUSCULAR | Status: DC | PRN
  Administered 2020-10-19: 4 mg via INTRAVENOUS
  Filled 2020-10-19: qty 2

## 2020-10-19 MED ORDER — ELVITEG-COBIC-EMTRICIT-TENOFAF 150-150-200-10 MG PO TABS
1.0000 | ORAL_TABLET | Freq: Every day | ORAL | Status: DC
  Filled 2020-10-19: qty 1

## 2020-10-19 MED ORDER — THIAMINE HCL 100 MG PO TABS
100.0000 mg | ORAL_TABLET | Freq: Every day | ORAL | Status: DC
  Filled 2020-10-19: qty 1

## 2020-10-19 MED ORDER — SODIUM CHLORIDE 0.9 % IV BOLUS
1000.0000 mL | Freq: Once | INTRAVENOUS | Status: AC
  Administered 2020-10-19: 1000 mL via INTRAVENOUS

## 2020-10-19 MED ORDER — SODIUM CHLORIDE 0.9 % IV SOLN: INTRAVENOUS | Status: DC

## 2020-10-19 MED ORDER — ACETAMINOPHEN 325 MG PO TABS: 650.0000 mg | ORAL_TABLET | Freq: Four times a day (QID) | ORAL | Status: DC | PRN

## 2020-10-19 MED ORDER — SODIUM CHLORIDE 0.9 % IV SOLN: Freq: Once | INTRAVENOUS | Status: AC

## 2020-10-19 MED ORDER — LORAZEPAM 2 MG/ML IJ SOLN
0.0000 mg | Freq: Four times a day (QID) | INTRAMUSCULAR | Status: DC
  Administered 2020-10-19: 2 mg via INTRAVENOUS
  Filled 2020-10-19 (×2): qty 1

## 2020-10-19 MED ORDER — LORAZEPAM 2 MG/ML IJ SOLN: 0.0000 mg | Freq: Two times a day (BID) | INTRAMUSCULAR | Status: DC

## 2020-10-19 MED ORDER — CHLORDIAZEPOXIDE HCL 5 MG PO CAPS: 10.0000 mg | ORAL_CAPSULE | Freq: Three times a day (TID) | ORAL | Status: DC

## 2020-10-19 NOTE — ED Notes (Signed)
This RN informed by EDT that he wants to leave. EDT explaining RN caught in a critical pt's room and relay the message to RN and MD.

## 2020-10-19 NOTE — BH Assessment (Signed)
Referral information for Detox Treatment faxed to;   . [facility] [phone])  . [facility] [phone])  . [facility] [phone])

## 2020-10-19 NOTE — ED Notes (Signed)
Patient restless, walking up and down the hall. He is complaining of chest pain. EDP notified.

## 2020-10-19 NOTE — ED Notes (Signed)
MD paged by secretary for this RN

## 2020-10-19 NOTE — ED Notes (Signed)
Patient c/o chest pain again. EDP notified.

## 2020-10-19 NOTE — ED Notes (Signed)
Hourly rounding reveals patient in room. No complaints, stable, in no acute distress. Q15 minute rounds and monitoring via Rover and Officer to continue.   

## 2020-10-19 NOTE — ED Notes (Signed)
Patient moved to room 19 for medical attention.

## 2020-10-19 NOTE — ED Notes (Signed)
Pt was resting, now anxious, fidgeting with wires in room, continues to say "I am anxious" appears to have increased tremors.

## 2020-10-19 NOTE — ED Notes (Signed)
Disregard care handoff

## 2020-10-19 NOTE — H&P (Signed)
History and Physical    Gary [name] TOI:[id number] DOB: Oct 10, 1986 DOA: 10/18/2020  PCP: Patient, No Pcp Per   Patient coming from: Home  I have personally briefly reviewed patient's old medical records in [facility]  Chief Complaint: Request for alcohol detox, vomiting  HPI: Gary [name] is a 34 y.o. male with medical history significant for heavy alcohol use disorder drinking after 5 bottles of wine daily, as well as history of HIV who presented to the emergency room voluntarily requesting detox. ED Course: On arrival he was tachycardic at 123, BP 128/94, afebrile with O2 sat 100% on room air.  Blood work showed alcohol level of 340, UDS positive for amphetamines and benzodiazepines.  CBC and CMP unremarkable, acetaminophen and salicylate levels below detectable.  While in the emergency room he remained very tachycardic and troponin was done which resulted at 54.  During this time patient developed tremors and started vomiting.  IV hydration started as well as CIWA protocol.  Hospitalist consulted for admission. EKG as reviewed by me : Sinus tach at 125   Review of Systems: As per HPI otherwise all other systems on review of systems negative.    Past Medical History:  Diagnosis Date  . Alcohol abuse   . HIV (human immunodeficiency virus infection) (HCC)   . Subdural hematoma [facility])     Past Surgical History:  Procedure Laterality Date  . INCISION AND DRAINAGE PERIRECTAL ABSCESS N/A 09/24/2016   Procedure: IRRIGATION AND DEBRIDEMENT PERIRECTAL ABSCESS;  Surgeon: Ricarda Frame, MD;  Location: [facility] ORS;  Service: General;  Laterality: N/A;  . none       reports that he has quit smoking. His smoking use included cigarettes. He smoked 0.50 packs per day. He has never used smokeless tobacco. He reports current alcohol use. He reports previous drug use. Drugs: Marijuana and Methamphetamines.  Allergies  Allergen Reactions  . Tegretol [Carbamazepine] Other (See  Comments)    Caused vertigo for 2 days after taking it  . Caffeine Palpitations    Family History  Problem Relation Age of Onset  . Alcohol abuse Mother   . Alcohol abuse Father       Prior to Admission medications   Medication Sig Start Date End Date Taking? Authorizing Provider  chlordiazePOXIDE (LIBRIUM) 25 MG capsule 25 mg PO TID x 1 day, then 25 mg PO BID X 1 day,  then 25  PO QD X 1 day 09/28/20   Gilles Chiquito, MD  elvitegravir-cobicistat-emtricitabine-tenofovir (GENVOYA) 150-150-200-10 MG TABS tablet Take 1 tablet by mouth daily with breakfast. 09/28/20   Clapacs, Jackquline Denmark, MD  FLUoxetine (PROZAC) 20 MG capsule Take 1 capsule (20 mg total) by mouth daily. 09/27/20 10/27/20  Clapacs, Jackquline Denmark, MD  nicotine (NICODERM CQ - DOSED IN MG/24 HOURS) 21 mg/24hr patch Place 1 patch (21 mg total) onto the skin daily. Patient not taking: Reported on 06/07/2020 05/09/20   Burnadette Pop, MD  pantoprazole (PROTONIX) 40 MG tablet Take 1 tablet (40 mg total) by mouth daily. 09/27/20   Clapacs, Jackquline Denmark, MD  sucralfate (CARAFATE) 1 GM/10ML suspension Take 10 mLs (1 g total) by mouth 4 (four) times daily -  with meals and at bedtime for 14 days. Patient not taking: Reported on 06/07/2020 05/09/20 06/07/20  Burnadette Pop, MD  famotidine (PEPCID) 20 MG tablet Take 1 tablet (20 mg total) by mouth 2 (two) times daily. Patient not taking: Reported on 06/01/2019 05/14/19 06/02/19  Benjiman Core, MD  Physical Exam: Vitals:   10/18/20 2120 10/19/20 0222 10/19/20 0230 10/19/20 0300  BP: (!) 128/94 (!) 120/99 (!) 131/95 (!) 130/96  Pulse: (!) 123 (!) 125 (!) 117 (!) 110  Resp:  (!) 22 (!) 23 19  Temp:      TempSrc:      SpO2:  100% 100% 100%     Vitals:   10/18/20 2120 10/19/20 0222 10/19/20 0230 10/19/20 0300  BP: (!) 128/94 (!) 120/99 (!) 131/95 (!) 130/96  Pulse: (!) 123 (!) 125 (!) 117 (!) 110  Resp:  (!) 22 (!) 23 19  Temp:      TempSrc:      SpO2:  100% 100% 100%      Constitutional:  Alert and oriented x 3 . Appears anxious, tremoulous HEENT:      Head: Normocephalic and atraumatic.         Eyes: PERLA, EOMI, Conjunctivae are normal. Sclera is non-icteric.       Mouth/Throat: Mucous membranes are moist.       Neck: Supple with no signs of meningismus. Cardiovascular:  Tachycardic. No murmurs, gallops, or rubs. 2+ symmetrical distal pulses are present . No JVD. No LE edema Respiratory: Respiratory effort normal .Lungs sounds clear bilaterally. No wheezes, crackles, or rhonchi.  Gastrointestinal: Soft, non tender, and non distended with positive bowel sounds.  Genitourinary: No CVA tenderness. Musculoskeletal: Nontender with normal range of motion in all extremities. No cyanosis, or erythema of extremities. Neurologic:  Face is symmetric. Moving all extremities. No gross focal neurologic deficits.  Fine tremor to hands. Skin: Skin is warm, dry.  No rash or ulcers Psychiatric: Appears anxious   Labs on Admission: I have personally reviewed following labs and imaging studies  CBC: Recent Labs  Lab 10/19/20 0011  WBC 5.3  HGB 16.4  HCT 45.9  MCV 94.8  PLT 224   Basic Metabolic Panel: Recent Labs  Lab 10/19/20 0011  NA 139  K 3.6  CL 100  CO2 25  GLUCOSE 111*  BUN 6  CREATININE 0.64  CALCIUM 8.9   GFR: Estimated Creatinine Clearance: 136.2 mL/min (by C-G formula based on SCr of 0.64 mg/dL). Liver Function Tests: Recent Labs  Lab 10/19/20 0011  AST 54*  ALT 40  ALKPHOS 108  BILITOT 0.6  PROT 9.0*  ALBUMIN 4.4   No results for input(s): LIPASE, AMYLASE in the last 168 hours. No results for input(s): AMMONIA in the last 168 hours. Coagulation Profile: No results for input(s): INR, PROTIME in the last 168 hours. Cardiac Enzymes: No results for input(s): CKTOTAL, CKMB, CKMBINDEX, TROPONINI in the last 168 hours. BNP (last 3 results) No results for input(s): PROBNP in the last 8760 hours. HbA1C: No results for input(s): HGBA1C in the last 72  hours. CBG: No results for input(s): GLUCAP in the last 168 hours. Lipid Profile: No results for input(s): CHOL, HDL, LDLCALC, TRIG, CHOLHDL, LDLDIRECT in the last 72 hours. Thyroid Function Tests: No results for input(s): TSH, T4TOTAL, FREET4, T3FREE, THYROIDAB in the last 72 hours. Anemia Panel: No results for input(s): VITAMINB12, FOLATE, FERRITIN, TIBC, IRON, RETICCTPCT in the last 72 hours. Urine analysis:    Component Value Date/Time   COLORURINE STRAW (A) 05/15/2020 1824   APPEARANCEUR CLEAR 05/15/2020 1824   APPEARANCEUR Clear 11/15/2014 1755   LABSPEC 1.003 (L) 05/15/2020 1824   LABSPEC 1.002 11/15/2014 1755   PHURINE 6.0 05/15/2020 1824   GLUCOSEU NEGATIVE 05/15/2020 1824   GLUCOSEU Negative 11/15/2014 1755   HGBUR  NEGATIVE 05/15/2020 1824   BILIRUBINUR NEGATIVE 05/15/2020 1824   BILIRUBINUR Negative 11/15/2014 1755   KETONESUR NEGATIVE 05/15/2020 1824   PROTEINUR NEGATIVE 05/15/2020 1824   NITRITE NEGATIVE 05/15/2020 1824   LEUKOCYTESUR NEGATIVE 05/15/2020 1824   LEUKOCYTESUR Negative 11/15/2014 1755    Radiological Exams on Admission: No results found.   Assessment/Plan 34 year old male heavy alcohol use substance abuse as well as history of HIV presenting requesting detox from alcohol, going into acute withdrawal while in the ER    Alcohol withdrawal with inpatient treatment, with unspecified complication (HCC) -Patient tachycardic with tremors, vomiting in the ER -CIWA withdrawal protocol -IV hydration, IV antiemetics  Sinus tachycardia -Secondary to withdrawal    Elevated troponin -Likely demand ischemia from tachycardia -Treat withdrawal.  Can continue to trend    HIV disease (HCC) -No acute disease    Polysubstance abuse (HCC) -UDS positive for methamphetamine -Monitor for withdrawal   DVT prophylaxis: Lovenox  Code Status: full code  Family Communication:  none  Disposition Plan: Back to previous home environment Consults called: none   Status:At the time of admission, it appears that the appropriate admission status for this patient is INPATIENT. This is judged to be reasonable and necessary in order to provide the required intensity of service to ensure the patient's safety given the presenting symptoms, physical exam findings, and initial radiographic and laboratory data in the context of their  Comorbid conditions.   Patient requires inpatient status due to high intensity of service, high risk for further deterioration and high frequency of surveillance required.   I certify that at the point of admission it is my clinical judgment that the patient will require inpatient hospital care spanning beyond 2 midnights       Andris Baumann MD [facility]     10/19/2020, 3:45 AM

## 2020-10-19 NOTE — Progress Notes (Signed)
This 34 year old gentleman with a history of heavy alcohol abuse who drinks approximately 5 bottles of wine in addition to beer and other liquors presented to ED to seek help for alcohol withdrawal.  Patient seen and examined in the ED.  He was slightly lethargic and partially oriented.  He says that he was not feeling well and he knew that he was having withdrawal from alcohol.  Had some anxiety.  Had obvious gross tremors in upper extremities on examination.  His recent CIWA was 14.  He has received couple of doses of as needed Ativan overnight.  We will discontinue scheduled Ativan and start on Librium 25 mg 3 times daily for next 2 days and taper after that with 10 mg p.o. 3 times daily.  Continue CIWA protocol with as needed Ativan at this point in time.

## 2020-10-19 NOTE — ED Provider Notes (Signed)
-----------------------------------------   1:46 AM on 10/19/2020 -----------------------------------------  Patient care assumed from Dr. Nida Boatman letter.  Patient has been intermittently complaining of chest pain which he states has been ongoing over the past several days.  EKG does not appear to show any concerning findings.  Troponin was added on but did show an elevation of 54.  I reviewed the patient's record, he had elevated troponin II weeks ago as well.  We will repeat a troponin at 2 hours.  Highly suspect the troponin elevation is due to demand ischemia given the patient's persistent tachycardia likely due to withdrawal.  We will continue to treat with Ativan and IV fluids while awaiting repeat troponin.  Repeat troponin is pending.  Patient continues to appear unwell, quite tremulous with frequent nausea or vomiting describing fall body aches/pain.  We will admit to the hospital service given his continued chest pain with elevated troponin and significant alcohol withdrawal symptoms.     EKG viewed and interpreted by myself shows sinus tachycardia 125 bpm with a narrow QRS, normal axis, normal intervals, nonspecific ST changes.   Minna Antis, MD 10/19/20 [phone]

## 2020-10-20 ENCOUNTER — Other Ambulatory Visit: Payer: Self-pay

## 2020-10-20 ENCOUNTER — Encounter: Payer: Self-pay | Admitting: Emergency Medicine

## 2020-10-20 LAB — URINE DRUG SCREEN, QUALITATIVE (ARMC ONLY)
Amphetamines, Ur Screen: POSITIVE — AB
Barbiturates, Ur Screen: NOT DETECTED
Benzodiazepine, Ur Scrn: POSITIVE — AB
Cannabinoid 50 Ng, Ur ~~LOC~~: NOT DETECTED
Cocaine Metabolite,Ur ~~LOC~~: NOT DETECTED
MDMA (Ecstasy)Ur Screen: NOT DETECTED
Methadone Scn, Ur: NOT DETECTED
Opiate, Ur Screen: NOT DETECTED
Phencyclidine (PCP) Ur S: NOT DETECTED
Tricyclic, Ur Screen: NOT DETECTED

## 2020-10-20 LAB — CBC WITH DIFFERENTIAL/PLATELET
Abs Immature Granulocytes: 0.01 10*3/uL (ref 0.00–0.07)
Basophils Absolute: 0 10*3/uL (ref 0.0–0.1)
Basophils Relative: 1 %
Eosinophils Absolute: 0 10*3/uL (ref 0.0–0.5)
Eosinophils Relative: 1 %
HCT: 39.4 % (ref 39.0–52.0)
Hemoglobin: 13.7 g/dL (ref 13.0–17.0)
Immature Granulocytes: 0 %
Lymphocytes Relative: 28 %
Lymphs Abs: 1.2 10*3/uL (ref 0.7–4.0)
MCH: 33.4 pg (ref 26.0–34.0)
MCHC: 34.8 g/dL (ref 30.0–36.0)
MCV: 96.1 fL (ref 80.0–100.0)
Monocytes Absolute: 0.4 10*3/uL (ref 0.1–1.0)
Monocytes Relative: 8 %
Neutro Abs: 2.7 10*3/uL (ref 1.7–7.7)
Neutrophils Relative %: 62 %
Platelets: 121 10*3/uL — ABNORMAL LOW (ref 150–400)
RBC: 4.1 MIL/uL — ABNORMAL LOW (ref 4.22–5.81)
RDW: 12.7 % (ref 11.5–15.5)
WBC: 4.4 10*3/uL (ref 4.0–10.5)
nRBC: 0 % (ref 0.0–0.2)

## 2020-10-20 LAB — COMPREHENSIVE METABOLIC PANEL
ALT: 29 U/L (ref 0–44)
AST: 40 U/L (ref 15–41)
Albumin: 4 g/dL (ref 3.5–5.0)
Alkaline Phosphatase: 91 U/L (ref 38–126)
Anion gap: 11 (ref 5–15)
BUN: 8 mg/dL (ref 6–20)
CO2: 24 mmol/L (ref 22–32)
Calcium: 8.6 mg/dL — ABNORMAL LOW (ref 8.9–10.3)
Chloride: 99 mmol/L (ref 98–111)
Creatinine, Ser: 0.75 mg/dL (ref 0.61–1.24)
GFR, Estimated: >60 mL/min (ref >60)
Glucose, Bld: 80 mg/dL (ref 70–99)
Potassium: 3.5 mmol/L (ref 3.5–5.1)
Sodium: 134 mmol/L — ABNORMAL LOW (ref 135–145)
Total Bilirubin: 1 mg/dL (ref 0.3–1.2)
Total Protein: 7.8 g/dL (ref 6.5–8.1)

## 2020-10-20 LAB — RESP PANEL BY RT-PCR (FLU A&B, COVID) ARPGX2
Influenza A by PCR: NEGATIVE
Influenza B by PCR: NEGATIVE
SARS Coronavirus 2 by RT PCR: NEGATIVE

## 2020-10-20 LAB — ACETAMINOPHEN LEVEL: Acetaminophen (Tylenol), Serum: <10 ug/mL — ABNORMAL LOW (ref 10–30)

## 2020-10-20 LAB — SALICYLATE LEVEL: Salicylate Lvl: <7 mg/dL — ABNORMAL LOW (ref 7.0–30.0)

## 2020-10-20 LAB — ETHANOL: Alcohol, Ethyl (B): 267 mg/dL — ABNORMAL HIGH (ref <10)

## 2020-10-20 MED ORDER — THIAMINE HCL 100 MG/ML IJ SOLN: 100.0000 mg | Freq: Every day | INTRAMUSCULAR | Status: DC

## 2020-10-20 MED ORDER — LORAZEPAM 2 MG/ML IJ SOLN
1.0000 mg | Freq: Once | INTRAMUSCULAR | Status: AC
  Administered 2020-10-20: 1 mg via INTRAMUSCULAR
  Filled 2020-10-20: qty 1

## 2020-10-20 MED ORDER — LORAZEPAM 2 MG PO TABS
0.0000 mg | ORAL_TABLET | Freq: Four times a day (QID) | ORAL | Status: AC
  Administered 2020-10-20 – 2020-10-21 (×4): 2 mg via ORAL
  Filled 2020-10-20 (×5): qty 1

## 2020-10-20 MED ORDER — LORAZEPAM 2 MG/ML IJ SOLN
0.0000 mg | Freq: Four times a day (QID) | INTRAMUSCULAR | Status: AC
  Administered 2020-10-20 – 2020-10-21 (×3): 2 mg via INTRAVENOUS
  Filled 2020-10-20 (×3): qty 1

## 2020-10-20 MED ORDER — ONDANSETRON 4 MG PO TBDP
4.0000 mg | ORAL_TABLET | Freq: Once | ORAL | Status: AC
  Administered 2020-10-20: 4 mg via ORAL
  Filled 2020-10-20: qty 1

## 2020-10-20 MED ORDER — ONDANSETRON HCL 4 MG/2ML IJ SOLN
4.0000 mg | Freq: Once | INTRAMUSCULAR | Status: AC
  Administered 2020-10-20: 4 mg via INTRAVENOUS
  Filled 2020-10-20: qty 2

## 2020-10-20 MED ORDER — THIAMINE HCL 100 MG PO TABS
100.0000 mg | ORAL_TABLET | Freq: Every day | ORAL | Status: DC
  Administered 2020-10-20 – 2020-10-30 (×11): 100 mg via ORAL
  Filled 2020-10-20 (×12): qty 1

## 2020-10-20 MED ORDER — THIAMINE HCL 100 MG/ML IJ SOLN
Freq: Once | INTRAVENOUS | Status: AC
  Filled 2020-10-20: qty 1000

## 2020-10-20 NOTE — ED Notes (Signed)
Pt awake and V/S collected att

## 2020-10-20 NOTE — ED Notes (Signed)
Pt sat up, given warm blanket and returned to reclining position, pt appears to be resting

## 2020-10-20 NOTE — ED Notes (Signed)
Pt left wrist and 3rd finger abraded, right 2nd and 3rd finger tip abraded, bleeding controlled

## 2020-10-20 NOTE — ED Triage Notes (Signed)
Patient presents to Emergency Department via [facility] from parking lot where pt reports calling for "help with alcohol detox" pt reports drinking 1 x 24 oz 6% [employer]; EMS report call for fall: pt has abrasions to hands, pt denies pain and bleeding controlled.  Pt reports HIV infection and takes meds for same - unsure of name.      History of DC within the last 24 hours

## 2020-10-20 NOTE — ED Notes (Signed)
Pt dressed into blue paper scrubs att

## 2020-10-20 NOTE — BH Assessment (Addendum)
Assessment Note  Gary [name] is an 34 y.o. male presenting to [facility] ED voluntarily seeking detox treatment for his alcohol abuse and with SI with no plan. Per triage note Patient presents to Emergency Department via [facility] from parking lot where pt reports calling for "help with alcohol detox" pt reports drinking 1 x 24 oz 6% [employer]. Patient was just recently discharged for this ED on 10/19/20 for similar presentation and alcohol withdrawal. Patient reports he has been drinking this heavily "the last few months" but was unable to report an exact time frame. Patient reports current withdrawal symptoms as "cold chills, diarrhea, anxiety." Patient BAL is 267, UDS is positive for Amphetamines and Benzodiazepines. Patient's mood was cooperative, anxious and depressed, patient was alert and oriented x4. Patient reports SI with no plan, denies HI/AH/VH and does not appear to be responding to any internal or external stimuli.   Per Psyc NP Lerry Liner patient to be observed overnight and reassessed  Diagnosis: Alcohol Use Disorder, Severe. Substance-Induced Mood Disorder  Past Medical History:  Past Medical History:  Diagnosis Date  . Alcohol abuse   . HIV (human immunodeficiency virus infection) (HCC)   . Subdural hematoma [facility])     Past Surgical History:  Procedure Laterality Date  . INCISION AND DRAINAGE PERIRECTAL ABSCESS N/A 09/24/2016   Procedure: IRRIGATION AND DEBRIDEMENT PERIRECTAL ABSCESS;  Surgeon: Ricarda Frame, MD;  Location: [facility] ORS;  Service: General;  Laterality: N/A;  . none      Family History:  Family History  Problem Relation Age of Onset  . Alcohol abuse Mother   . Alcohol abuse Father     Social History:  reports that he has quit smoking. His smoking use included cigarettes. He smoked 0.50 packs per day. He has never used smokeless tobacco. He reports current alcohol use. He reports previous drug use. Drugs: Marijuana and  Methamphetamines.  Additional Social History:  Alcohol / Drug Use Pain Medications: See MAR Prescriptions: See MAR Over the Counter: See MAR History of alcohol / drug use?: Yes Substance #1 Name of Substance 1: Alcohol Substance #2 Name of Substance 2: Methamphetamine  CIWA: CIWA-Ar BP: (!) 140/99 Pulse Rate: (!) 106 Nausea and Vomiting: no nausea and no vomiting Tactile Disturbances: none Tremor: no tremor Auditory Disturbances: not present Paroxysmal Sweats: no sweat visible Visual Disturbances: not present Anxiety: two Headache, Fullness in Head: none present Agitation: normal activity Orientation and Clouding of Sensorium: cannot do serial additions or is uncertain about date CIWA-Ar Total: 3 COWS:    Allergies:  Allergies  Allergen Reactions  . Tegretol [Carbamazepine] Other (See Comments)    Caused vertigo for 2 days after taking it  . Caffeine Palpitations    Home Medications: (Not in a hospital admission)   OB/GYN Status:  No LMP for male patient.  General Assessment Data Location of Assessment: [facility] ED TTS Assessment: In system Is this a Tele or Face-to-Face Assessment?: Face-to-Face Is this an Initial Assessment or a Re-assessment for this encounter?: Initial Assessment Patient Accompanied by:: N/A Language Other than English: No Living Arrangements: Homeless/Shelter What gender do you identify as?: Male Marital status: Long term relationship Pregnancy Status: No Living Arrangements: Other (Comment) Can pt return to current living arrangement?: Yes Admission Status: Voluntary Is patient capable of signing voluntary admission?: Yes Referral Source: Self/Family/Friend Insurance type: None  Medical Screening Exam [facility] Walk-in ONLY) Medical Exam completed: Yes  Crisis Care Plan Living Arrangements: Other (Comment) Legal Guardian: Other: (Self) Name of Psychiatrist:  None Name of Therapist: None  Education Status Is patient currently in  school?: No Is the patient employed, unemployed or receiving disability?: Unemployed  Risk to self with the past 6 months Suicidal Ideation: Yes-Currently Present Has patient been a risk to self within the past 6 months prior to admission? : No Suicidal Intent: Yes-Currently Present Has patient had any suicidal intent within the past 6 months prior to admission? : No Is patient at risk for suicide?: No Suicidal Plan?: No Has patient had any suicidal plan within the past 6 months prior to admission? : No Specify Current Suicidal Plan: None Specify Access to Suicidal Means: None What has been your use of drugs/alcohol within the last 12 months?: Alcohol Abuse Previous Attempts/Gestures: Yes How many times?: 2 Other Self Harm Risks: None Triggers for Past Attempts: Spouse contact,Other (Comment) Intentional Self Injurious Behavior: None Family Suicide History: Unknown Recent stressful life event(s): Proofreader (Comment) (Homeless) Persecutory voices/beliefs?: No Depression: Yes Depression Symptoms: Isolating,Loss of interest in usual pleasures,Feeling worthless/self pity Substance abuse history and/or treatment for substance abuse?: Yes Suicide prevention information given to non-admitted patients: Not applicable  Risk to Others within the past 6 months Homicidal Ideation: No Does patient have any lifetime risk of violence toward others beyond the six months prior to admission? : No Thoughts of Harm to Others: No Current Homicidal Intent: No Current Homicidal Plan: No Access to Homicidal Means: No Identified Victim: None History of harm to others?: No Assessment of Violence: None Noted Violent Behavior Description: None Does patient have access to weapons?: No Criminal Charges Pending?: Yes Describe Pending Criminal Charges: Patient refused to share Does patient have a court date: No Is patient on probation?: No  Psychosis Hallucinations: None noted Delusions:  None noted  Mental Status Report Appearance/Hygiene: Unremarkable Eye Contact: Fair Motor Activity: Freedom of movement Speech: Logical/coherent,Slurred Level of Consciousness: Alert,Irritable Mood: Depressed Affect: Anxious,Depressed Anxiety Level: Moderate Thought Processes: Coherent Judgement: Partial Orientation: Person,Place,Time,Situation,Appropriate for developmental age Obsessive Compulsive Thoughts/Behaviors: None  Cognitive Functioning Concentration: Normal Memory: Recent Intact,Remote Intact Is patient IDD: No Insight: Fair Impulse Control: Fair Appetite: Fair Have you had any weight changes? : No Change Sleep: Decreased Total Hours of Sleep: 5 Vegetative Symptoms: None  ADLScreening [facility] Assessment Services) Patient's cognitive ability adequate to safely complete daily activities?: Yes Patient able to express need for assistance with ADLs?: Yes Independently performs ADLs?: Yes (appropriate for developmental age)  Prior Inpatient Therapy Prior Inpatient Therapy: Yes Prior Therapy Dates: 04/2019 Prior Therapy Facilty/Provider(s): [facility] Reason for Treatment: Major Depression/SA  Prior Outpatient Therapy Prior Outpatient Therapy: No Does patient have an ACCT team?: No Does patient have Intensive In-House Services?  : No Does patient have [facility] services? : No Does patient have P4CC services?: No  ADL Screening (condition at time of admission) Patient's cognitive ability adequate to safely complete daily activities?: Yes Is the patient deaf or have difficulty hearing?: No Does the patient have difficulty seeing, even when wearing glasses/contacts?: No Does the patient have difficulty concentrating, remembering, or making decisions?: No Patient able to express need for assistance with ADLs?: Yes Does the patient have difficulty dressing or bathing?: No Independently performs ADLs?: Yes (appropriate for developmental age) Does the patient have  difficulty walking or climbing stairs?: No Weakness of Legs: None Weakness of Arms/Hands: None  Home Assistive Devices/Equipment Home Assistive Devices/Equipment: None  Therapy Consults (therapy consults require a physician order) PT Evaluation Needed: No OT Evalulation Needed: No SLP Evaluation Needed: No  Advance Directives (For Healthcare) Does Patient Have a Medical Advance Directive?: No Would patient like information on creating a medical advance directive?: No - Patient declined          Disposition: Per Psyc NP Rashaun Dixon patient to be observed overnight and reassessed Disposition Initial Assessment Completed for this Encounter: Yes  On Site Evaluation by:   Reviewed with Physician:    Benay Pike MS LCASA 10/20/2020 3:01 AM

## 2020-10-20 NOTE — ED Notes (Signed)
Patient is vol pending soc consult

## 2020-10-20 NOTE — ED Notes (Signed)
Pt given breakfast tray

## 2020-10-20 NOTE — ED Notes (Signed)
Pt stood up to ask nurse "when is the next time I can have my medicine, im feeling sick", RN informed pt next ativan dose was at 1200. Pt reports nausea, headache, pins and needle sensation in hands, reports vision appears "like things are moving". Pt denies SI at this time. After speaking to RN pt laid back down onto stretcher and placed hands over head.

## 2020-10-20 NOTE — Discharge Summary (Signed)
Physician Discharge Summary  VESTAL NUTILE WUJ:[id number] DOB: Jun 07, 1986 DOA: 10/18/2020  PCP: Patient, No Pcp Per  Admit date: 10/18/2020 Discharge date: 10/20/2020  Admitted From: Home Disposition: Left AGAINST MEDICAL ADVICE  Recommendations for Outpatient Follow-up:  1. Follow up with PCP in 1-2 weeks 2. Please obtain BMP/CBC in one week 3. Please follow up with your PCP on the following pending results: Unresulted Labs (From admission, onward)         None       Home Health: None Equipment/Devices: None  Discharge Condition: Mild alcohol withdrawal but stable CODE STATUS: None Diet recommendation:   Subjective: Patient was seen and examined by myself early morning of 09/19/2020 around 9 AM.  At that time patient had mild alcohol withdrawal symptoms such as tremors in upper extremities but despite of that, he was calm, quiet and and had full capacity.  He did not have any specific complaint.  He just said that he was not feeling well and was feeling weak.  HPI: ARIYON BRUNEY is a 34 y.o. male with medical history significant for heavy alcohol use disorder drinking after 5 bottles of wine daily, as well as history of HIV who presented to the emergency room voluntarily requesting detox. ED Course: On arrival he was tachycardic at 123, BP 128/94, afebrile with O2 sat 100% on room air.  Blood work showed alcohol level of 340, UDS positive for amphetamines and benzodiazepines.  CBC and CMP unremarkable, acetaminophen and salicylate levels below detectable.  While in the emergency room he remained very tachycardic and troponin was done which resulted at 54.  During this time patient developed tremors and started vomiting.  IV hydration started as well as CIWA protocol.  Hospitalist consulted for admission. EKG as reviewed by me : Sinus tach at 125   Brief/Interim Summary: Patient was admitted to hospital service for treatment of alcohol withdrawal.  He was started on Librium and  CIWA protocol.  However, it appears that early morning of 10/20/2020, patient had left AGAINST MEDICAL ADVICE.  I was not on duty at that point in time.  I was not informed about this.  Unsure whether on call provider were informed.  I could not find any note either.  Of note, I see a note from psychiatry department early morning around 3 AM today that they were consulted for suicidal ideations however, no mention of suicidal ideations were mentioned in the admitting hospitalist H&P and patient himself denied having suicidal ideations or thoughts when I evaluated him yesterday morning.  Also, patient has several recurrent presentations to the ED with similar presentation and has been evaluated by psychiatry in the past multiple times.  He was also evaluated by psychiatry at this time and patient had denied any suicidal ideations to them as well.  Discharge Diagnoses:  Principal Problem:   Alcohol withdrawal with inpatient treatment, with unspecified complication (HCC) Active Problems:   HIV disease (HCC)   Polysubstance abuse (HCC)   Alcohol dependence (HCC)   Elevated troponin   Alcohol withdrawal (HCC)    Discharge Instructions   Allergies as of 10/19/2020      Reactions   Tegretol [carbamazepine] Other (See Comments)   Caused vertigo for 2 days after taking it   Caffeine Palpitations      Medication List    ASK your doctor about these medications   elvitegravir-cobicistat-emtricitabine-tenofovir 150-150-200-10 MG Tabs tablet Commonly known as: GENVOYA Take 1 tablet by mouth daily with breakfast.   FLUoxetine 20  MG capsule Commonly known as: PROZAC Take 1 capsule (20 mg total) by mouth daily.   nicotine 21 mg/24hr patch Commonly known as: NICODERM CQ - dosed in mg/24 hours Place 1 patch (21 mg total) onto the skin daily.   pantoprazole 40 MG tablet Commonly known as: PROTONIX Take 1 tablet (40 mg total) by mouth daily.       Allergies  Allergen Reactions  . Tegretol  [Carbamazepine] Other (See Comments)    Caused vertigo for 2 days after taking it  . Caffeine Palpitations    Consultations: Psychiatry   Procedures/Studies: DG Chest 2 View  Result Date: 10/04/2020 CLINICAL DATA:  Chest pressure. EXAM: CHEST - 2 VIEW COMPARISON:  Prior chest radiographs 06/28/2020. FINDINGS: Heart size within normal limits. No appreciable airspace consolidation.No evidence of pleural effusion or pneumothorax. No acute bony abnormality identified. IMPRESSION: No evidence of acute cardiopulmonary abnormality. Electronically Signed   By: Jackey Loge DO   On: 10/04/2020 17:10   CT Head Wo Contrast  Result Date: 09/27/2020 CLINICAL DATA:  Altered mental status. EXAM: CT HEAD WITHOUT CONTRAST TECHNIQUE: Contiguous axial images were obtained from the base of the skull through the vertex without intravenous contrast. COMPARISON:  Head CT 06/28/2020. FINDINGS: Brain: Mild generalized cerebral atrophy, advanced for age. There is no acute intracranial hemorrhage. No demarcated cortical infarct. No extra-axial fluid collection. No evidence of intracranial mass. No midline shift. Vascular: No hyperdense vessel. Skull: Normal. Negative for fracture or focal lesion. Sinuses/Orbits: Visualized orbits show no acute finding. Minimal ethmoid sinus mucosal thickening. IMPRESSION: No evidence of acute intracranial abnormality. Mild cerebral atrophy, advanced for age. Electronically Signed   By: Jackey Loge DO   On: 09/27/2020 18:35      Discharge Exam: Vitals:   10/19/20 1347 10/19/20 1400  BP: 123/81 128/87  Pulse: 84 94  Resp: 18 17  Temp:    SpO2: 99% 100%   Vitals:   10/19/20 1100 10/19/20 1234 10/19/20 1347 10/19/20 1400  BP: (!) 129/91 (!) 126/93 123/81 128/87  Pulse: 99 95 84 94  Resp: 12 16 18 17   Temp:      TempSrc:      SpO2: 100% 100% 99% 100%   Following examination performed by me around 9 AM on 10/19/2020.  General: Pt is alert, awake, not in acute distress but  feels very weak Cardiovascular: RRR, S1/S2 +, no rubs, no gallops Respiratory: CTA bilaterally, no wheezing, no rhonchi Abdominal: Soft, NT, ND, bowel sounds + Extremities: no edema, no cyanosis, gross upper extremity tremors.    The results of significant diagnostics from this hospitalization (including imaging, microbiology, ancillary and laboratory) are listed below for reference.     Microbiology: Recent Results (from the past 240 hour(s))  Resp Panel by RT-PCR (Flu A&B, Covid) Nasopharyngeal Swab     Status: None   Collection Time: 10/18/20  9:36 PM   Specimen: Nasopharyngeal Swab; Nasopharyngeal(NP) swabs in vial transport medium  Result Value Ref Range Status   SARS Coronavirus 2 by RT PCR NEGATIVE NEGATIVE Final    Comment: (NOTE) SARS-CoV-2 target nucleic acids are NOT DETECTED.  The SARS-CoV-2 RNA is generally detectable in upper respiratory specimens during the acute phase of infection. The lowest concentration of SARS-CoV-2 viral copies this assay can detect is 138 copies/mL. A negative result does not preclude SARS-Cov-2 infection and should not be used as the sole basis for treatment or other patient management decisions. A negative result may occur with  improper specimen collection/handling, submission  of specimen other than nasopharyngeal swab, presence of viral mutation(s) within the areas targeted by this assay, and inadequate number of viral copies(<138 copies/mL). A negative result must be combined with clinical observations, patient history, and epidemiological information. The expected result is Negative.  Fact Sheet for Patients:  BloggerCourse.com  Fact Sheet for Healthcare Providers:  SeriousBroker.it  This test is no t yet approved or cleared by the [place] FDA and  has been authorized for detection and/or diagnosis of SARS-CoV-2 by FDA under an Emergency Use Authorization (EUA). This EUA will  remain  in effect (meaning this test can be used) for the duration of the COVID-19 declaration under Section 564(b)(1) of the Act, 21 U.S.C.section 360bbb-3(b)(1), unless the authorization is terminated  or revoked sooner.       Influenza A by PCR NEGATIVE NEGATIVE Final   Influenza B by PCR NEGATIVE NEGATIVE Final    Comment: (NOTE) The Xpert Xpress SARS-CoV-2/FLU/RSV plus assay is intended as an aid in the diagnosis of influenza from Nasopharyngeal swab specimens and should not be used as a sole basis for treatment. Nasal washings and aspirates are unacceptable for Xpert Xpress SARS-CoV-2/FLU/RSV testing.  Fact Sheet for Patients: BloggerCourse.com  Fact Sheet for Healthcare Providers: SeriousBroker.it  This test is not yet approved or cleared by the [place] FDA and has been authorized for detection and/or diagnosis of SARS-CoV-2 by FDA under an Emergency Use Authorization (EUA). This EUA will remain in effect (meaning this test can be used) for the duration of the COVID-19 declaration under Section 564(b)(1) of the Act, 21 U.S.C. section 360bbb-3(b)(1), unless the authorization is terminated or revoked.  Performed at [facility], [address] [place], [place] [address]      Labs: BNP (last 3 results) No results for input(s): BNP in the last 8760 hours. Basic Metabolic Panel: Recent Labs  Lab 10/19/20 0011 10/19/20 0410 10/20/20 0055  NA 139  --  134*  K 3.6  --  3.5  CL 100  --  99  CO2 25  --  24  GLUCOSE 111*  --  80  BUN 6  --  8  CREATININE 0.64  --  0.75  CALCIUM 8.9  --  8.6*  MG  --  2.1  --   PHOS  --  3.7  --    Liver Function Tests: Recent Labs  Lab 10/19/20 0011 10/20/20 0055  AST 54* 40  ALT 40 29  ALKPHOS 108 91  BILITOT 0.6 1.0  PROT 9.0* 7.8  ALBUMIN 4.4 4.0   No results for input(s): LIPASE, AMYLASE in the last 168 hours. No results for input(s): AMMONIA in the  last 168 hours. CBC: Recent Labs  Lab 10/19/20 0011 10/20/20 0055  WBC 5.3 4.4  NEUTROABS  --  2.7  HGB 16.4 13.7  HCT 45.9 39.4  MCV 94.8 96.1  PLT 224 121*   Cardiac Enzymes: No results for input(s): CKTOTAL, CKMB, CKMBINDEX, TROPONINI in the last 168 hours. BNP: Invalid input(s): POCBNP CBG: No results for input(s): GLUCAP in the last 168 hours. D-Dimer No results for input(s): DDIMER in the last 72 hours. Hgb A1c No results for input(s): HGBA1C in the last 72 hours. Lipid Profile No results for input(s): CHOL, HDL, LDLCALC, TRIG, CHOLHDL, LDLDIRECT in the last 72 hours. Thyroid function studies No results for input(s): TSH, T4TOTAL, T3FREE, THYROIDAB in the last 72 hours.  Invalid input(s): FREET3 Anemia work up No results for input(s): VITAMINB12, FOLATE, FERRITIN, TIBC,  IRON, RETICCTPCT in the last 72 hours. Urinalysis    Component Value Date/Time   COLORURINE STRAW (A) 05/15/2020 1824   APPEARANCEUR CLEAR 05/15/2020 1824   APPEARANCEUR Clear 11/15/2014 1755   LABSPEC 1.003 (L) 05/15/2020 1824   LABSPEC 1.002 11/15/2014 1755   PHURINE 6.0 05/15/2020 1824   GLUCOSEU NEGATIVE 05/15/2020 1824   GLUCOSEU Negative 11/15/2014 1755   HGBUR NEGATIVE 05/15/2020 1824   BILIRUBINUR NEGATIVE 05/15/2020 1824   BILIRUBINUR Negative 11/15/2014 1755   KETONESUR NEGATIVE 05/15/2020 1824   PROTEINUR NEGATIVE 05/15/2020 1824   NITRITE NEGATIVE 05/15/2020 1824   LEUKOCYTESUR NEGATIVE 05/15/2020 1824   LEUKOCYTESUR Negative 11/15/2014 1755   Sepsis Labs Invalid input(s): PROCALCITONIN,  WBC,  LACTICIDVEN Microbiology Recent Results (from the past 240 hour(s))  Resp Panel by RT-PCR (Flu A&B, Covid) Nasopharyngeal Swab     Status: None   Collection Time: 10/18/20  9:36 PM   Specimen: Nasopharyngeal Swab; Nasopharyngeal(NP) swabs in vial transport medium  Result Value Ref Range Status   SARS Coronavirus 2 by RT PCR NEGATIVE NEGATIVE Final    Comment: (NOTE) SARS-CoV-2  target nucleic acids are NOT DETECTED.  The SARS-CoV-2 RNA is generally detectable in upper respiratory specimens during the acute phase of infection. The lowest concentration of SARS-CoV-2 viral copies this assay can detect is 138 copies/mL. A negative result does not preclude SARS-Cov-2 infection and should not be used as the sole basis for treatment or other patient management decisions. A negative result may occur with  improper specimen collection/handling, submission of specimen other than nasopharyngeal swab, presence of viral mutation(s) within the areas targeted by this assay, and inadequate number of viral copies(<138 copies/mL). A negative result must be combined with clinical observations, patient history, and epidemiological information. The expected result is Negative.  Fact Sheet for Patients:  BloggerCourse.com  Fact Sheet for Healthcare Providers:  SeriousBroker.it  This test is no t yet approved or cleared by the [place] FDA and  has been authorized for detection and/or diagnosis of SARS-CoV-2 by FDA under an Emergency Use Authorization (EUA). This EUA will remain  in effect (meaning this test can be used) for the duration of the COVID-19 declaration under Section 564(b)(1) of the Act, 21 U.S.C.section 360bbb-3(b)(1), unless the authorization is terminated  or revoked sooner.       Influenza A by PCR NEGATIVE NEGATIVE Final   Influenza B by PCR NEGATIVE NEGATIVE Final    Comment: (NOTE) The Xpert Xpress SARS-CoV-2/FLU/RSV plus assay is intended as an aid in the diagnosis of influenza from Nasopharyngeal swab specimens and should not be used as a sole basis for treatment. Nasal washings and aspirates are unacceptable for Xpert Xpress SARS-CoV-2/FLU/RSV testing.  Fact Sheet for Patients: BloggerCourse.com  Fact Sheet for Healthcare  Providers: SeriousBroker.it  This test is not yet approved or cleared by the [place] FDA and has been authorized for detection and/or diagnosis of SARS-CoV-2 by FDA under an Emergency Use Authorization (EUA). This EUA will remain in effect (meaning this test can be used) for the duration of the COVID-19 declaration under Section 564(b)(1) of the Act, 21 U.S.C. section 360bbb-3(b)(1), unless the authorization is terminated or revoked.  Performed at [facility], [address], [place], [place] [address]      Time coordinating discharge: Over 30 minutes  SIGNED:   Hughie Closs, MD  [facility] 10/20/2020, 8:03 AM  If 7PM-7AM, please contact night-coverage [email or web address]

## 2020-10-20 NOTE — ED Notes (Signed)
Pt given lunch tray, pt also refusing tray saying he fells to sick to eat. Pt also had not eaten his breakfast tray.

## 2020-10-20 NOTE — ED Notes (Signed)
Patient speaking with S.O.C. 

## 2020-10-20 NOTE — ED Notes (Signed)
Soc called  at 11:06

## 2020-10-20 NOTE — ED Provider Notes (Signed)
[facility] Emergency Department Provider Note   ____________________________________________   Event Date/Time   First MD Initiated Contact with Patient 10/20/20 0006     (approximate)  I have reviewed the triage vital signs and the nursing notes.   HISTORY  Chief Complaint Fall and Alcohol Intoxication    HPI Gary [name] is a 34 y.o. male brought to the ED via EMS from a parking lot for alcohol detox and suicidal ideation.  Patient with a history of substance abuse, mainly methamphetamines who drink heavily tonight and admits to suicidal thoughts without plan.  Patient is well-known to the emergency department with frequent visits for alcohol intoxication and methamphetamine abuse.  Denies HI/AH/VH.  Presents with abrasions to hands status post fall.  Denies striking head or LOC.  Otherwise voices no other complaints or injuries.     Past Medical History:  Diagnosis Date  . Alcohol abuse   . HIV (human immunodeficiency virus infection) (HCC)   . Subdural hematoma [facility])     Patient Active Problem List   Diagnosis Date Noted  . Elevated troponin 10/19/2020  . Alcohol withdrawal (HCC) 10/19/2020  . Alcohol abuse   . Alcohol poisoning   . Suicidal ideation 06/14/2020  . Pancreatitis 06/13/2020  . 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  . Malnutrition of moderate degree 05/07/2020  . Gastrointestinal hemorrhage   . Alcoholic hepatitis without ascites   . Melena 05/05/2020  . Fever 05/05/2020  . Alcoholic ketoacidosis 05/05/2020  . Alcohol withdrawal with inpatient treatment, with unspecified complication (HCC) 04/17/2020  . Thrombocytopenia (HCC) 07/25/2019  . Transaminitis 07/25/2019  . Diarrhea 07/25/2019  . Traumatic subdural hematoma (HCC) 07/02/2019  . Alcohol abuse with intoxication (HCC) 07/02/2019  . Alcohol abuse with alcohol-induced mood disorder (HCC) 05/13/2019  . Alcohol dependence (HCC) 04/16/2019  . 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 HIV disease (HCC) 06/16/2018  . Polysubstance abuse (HCC) 06/16/2018  . Cigarette smoker 06/16/2018    Past Surgical History:  Procedure Laterality Date  . INCISION AND DRAINAGE PERIRECTAL ABSCESS N/A 09/24/2016   Procedure: IRRIGATION AND DEBRIDEMENT PERIRECTAL ABSCESS;  Surgeon: Ricarda Frame, MD;  Location: [facility] ORS;  Service: General;  Laterality: N/A;  . none      Prior to Admission medications   Medication Sig Start Date End Date Taking? Authorizing Provider  elvitegravir-cobicistat-emtricitabine-tenofovir (GENVOYA) 150-150-200-10 MG TABS tablet Take 1 tablet by mouth daily with breakfast. 09/28/20   Clapacs, Jackquline Denmark, MD  FLUoxetine (PROZAC) 20 MG capsule Take 1 capsule (20 mg total) by mouth daily. Patient not taking: Reported on 10/19/2020 09/27/20 10/27/20  Clapacs, Jackquline Denmark, MD  nicotine (NICODERM CQ - DOSED IN MG/24 HOURS) 21 mg/24hr patch Place 1 patch (21 mg total) onto the skin daily. Patient not taking: No sig reported 05/09/20   Burnadette Pop, MD  pantoprazole (PROTONIX) 40 MG tablet Take 1 tablet (40 mg total) by mouth daily. Patient not taking: Reported on 10/19/2020 09/27/20   Clapacs, Jackquline Denmark, MD  famotidine (PEPCID) 20 MG tablet Take 1 tablet (20 mg total) by mouth 2 (two) times daily. Patient not taking: Reported on 06/01/2019 05/14/19 06/02/19  Benjiman Core, MD    Allergies Tegretol [carbamazepine] and Caffeine  Family History  Problem Relation Age of Onset  . Alcohol abuse Mother   . Alcohol abuse Father     Social History Social History   Tobacco Use  . Smoking status: Former Smoker    Packs/day:  0.50    Types: Cigarettes  . Smokeless tobacco: Never Used  . Tobacco comment: unable to smoke while incarcerated 6+ months 02/13/20  Vaping Use  . Vaping Use: Never used  Substance Use Topics  . Alcohol use: Yes  . Drug use: Not Currently    Types: Marijuana,  Methamphetamines    Comment: last used 04/10/2019    Review of Systems  Constitutional: No fever/chills Eyes: No visual changes. ENT: No sore throat. Cardiovascular: Denies chest pain. Respiratory: Denies shortness of breath. Gastrointestinal: No abdominal pain.  No nausea, no vomiting.  No diarrhea.  No constipation. Genitourinary: Negative for dysuria. Musculoskeletal: Negative for back pain. Skin: Negative for rash. Neurological: Negative for headaches, focal weakness or numbness. Psychiatric: Positive for alcohol intoxication and suicidal thoughts.  ____________________________________________   PHYSICAL EXAM:  VITAL SIGNS: ED Triage Vitals  Enc Vitals Group     BP 10/20/20 0003 (!) 140/99     Pulse Rate 10/20/20 0003 (!) 106     Resp 10/20/20 0003 17     Temp 10/20/20 0003 98.8 F (37.1 C)     Temp Source 10/20/20 0003 Oral     SpO2 10/20/20 0003 100 %     Weight 10/20/20 0004 150 lb (68 kg)     Height 10/20/20 0004 6\' 1"  (1.854 m)     Head Circumference --      Peak Flow --      Pain Score --      Pain Loc --      Pain Edu? --      Excl. in GC? --     Constitutional: Alert and oriented.  Intoxicated appearing and in no acute distress. Eyes: Conjunctivae are normal. PERRL. EOMI. Head: Atraumatic. Nose: Atraumatic. Mouth/Throat: Mucous membranes are moist.  No dental malocclusion.   Neck: No stridor.  No cervical spine tenderness to palpation. Cardiovascular: Normal rate, regular rhythm. Grossly normal heart sounds.  Good peripheral circulation. Respiratory: Normal respiratory effort.  No retractions. Lungs CTAB. Gastrointestinal: Soft and nontender to light or deep palpation. No distention. No abdominal bruits. No CVA tenderness. Musculoskeletal: Scattered abrasions to hands.  No lower extremity tenderness nor edema.  No joint effusions. Neurologic:  Normal speech and language. No gross focal neurologic deficits are appreciated. No gait instability. Skin:   Skin is warm, dry and intact. No rash noted. Psychiatric: Mood and affect are normal. Speech and behavior are normal.  ____________________________________________   LABS (all labs ordered are listed, but only abnormal results are displayed)  Labs Reviewed  CBC WITH DIFFERENTIAL/PLATELET - Abnormal; Notable for the following components:      Result Value   RBC 4.10 (*)    Platelets 121 (*)    All other components within normal limits  COMPREHENSIVE METABOLIC PANEL - Abnormal; Notable for the following components:   Sodium 134 (*)    Calcium 8.6 (*)    All other components within normal limits  ETHANOL - Abnormal; Notable for the following components:   Alcohol, Ethyl (B) 267 (*)    All other components within normal limits  ACETAMINOPHEN LEVEL - Abnormal; Notable for the following components:   Acetaminophen (Tylenol), Serum <10 (*)    All other components within normal limits  SALICYLATE LEVEL - Abnormal; Notable for the following components:   Salicylate Lvl <7.0 (*)    All other components within normal limits  URINE DRUG SCREEN, QUALITATIVE ([facility] ONLY) - Abnormal; Notable for the following components:   Amphetamines, Ur Screen POSITIVE (*)  Benzodiazepine, Ur Scrn POSITIVE (*)    All other components within normal limits   ____________________________________________  EKG  None ____________________________________________  RADIOLOGY I, SUNG,JADE J, personally viewed and evaluated these images (plain radiographs) as part of my medical decision making, as well as reviewing the written report by the radiologist.  ED MD interpretation: None  Official radiology report(s): No results found.  ____________________________________________   PROCEDURES  Procedure(s) performed (including Critical Care):  Procedures   ____________________________________________   INITIAL IMPRESSION / ASSESSMENT AND PLAN / ED COURSE  As part of my medical decision making, I  reviewed the following data within the electronic MEDICAL RECORD NUMBER Nursing notes reviewed and incorporated, Labs reviewed, Old chart reviewed, A consult was requested and obtained from this/these consultant(s) Psychiatry and Notes from prior ED visits     34 year old male with a history of alcohol and substance abuse presenting for alcohol detox and suicidal ideation.  Contracts for safety while in the emergency department.  Will initiate IV banana bag, placed on CIWA scale, consult psychiatry and TTS.  The patient has been placed in psychiatric observation due to the need to provide a safe environment for the patient while obtaining psychiatric consultation and evaluation, as well as ongoing medical and medication management to treat the patient's condition.  The patient has not been placed under full IVC at this time.   Clinical Course as of 10/20/20 5462  Wynelle Link Oct 20, 2020  0227 Patient has been evaluated by psychiatric NP; will reassess in the morning once patient is more sober. 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 He was evaluated in the context of the global COVID-19 pandemic, which necessitated consideration that the patient might be at risk for infection with the SARS-CoV-2 virus that causes COVID-19. Institutional protocols and algorithms that pertain to the evaluation of patients at risk for COVID-19 are in a state of rapid change based on information released by regulatory bodies including the CDC and federal and state organizations. These policies and algorithms were followed during the  patient's care in the ED.  Some ED evaluations and interventions may be delayed as a result of limited staffing during and the pandemic.*   Note:  This document was prepared using Dragon voice recognition software and may include unintentional dictation errors.   Irean Hong, MD 10/20/20 [phone]

## 2020-10-20 NOTE — BH Assessment (Addendum)
Per [facility] patient is recommended for detox treatment  Referral information for Detox treatment faxed to;   . [facility] [id number]) Facility reports having detox beds available today. Staff confirms to have patient's referral, staff reports to contact back after 8:30am to complete pre-screening process  . [facility] [phone]) Will reports having detox beds available and will pass information along for nurse to review, provided staff with call back number and staff will contact back.

## 2020-10-21 DIAGNOSIS — F1510 Other stimulant abuse, uncomplicated: Secondary | ICD-10-CM

## 2020-10-21 DIAGNOSIS — F1014 Alcohol abuse with alcohol-induced mood disorder: Secondary | ICD-10-CM | POA: Diagnosis not present

## 2020-10-21 MED ORDER — CHLORDIAZEPOXIDE HCL 25 MG PO CAPS
25.0000 mg | ORAL_CAPSULE | Freq: Once | ORAL | Status: AC
  Administered 2020-10-21: 25 mg via ORAL
  Filled 2020-10-21: qty 1

## 2020-10-21 NOTE — ED Notes (Signed)
Pt given meal tray.

## 2020-10-21 NOTE — ED Notes (Signed)
Hourly rounding reveals patient in room. No complaints, stable, in no acute distress. Q15 minute rounds and monitoring via [employer] to continue. 

## 2020-10-21 NOTE — Consult Note (Signed)
[facility] Face-to-Face Psychiatry Consult   Reason for Consult: Consult for this 34 year old man with a history of alcohol abuse.  Presented to the emergency room yesterday blood alcohol level over 200.  Patient intoxicated with inappropriate behavior.  Interview today the patient says he is feeling weak and has not been able to eat or drink anything.  He is somewhat disoriented to time but is oriented to place and situation.  Denies suicidal thoughts.  Describes mood is really bad.  Says he needs more treatment for alcohol withdrawal.  Patient has been in and out of the emergency room repeatedly over the last several weeks intoxicated. Referring Physician: Paduchowski Patient Identification: Gary [name] MRN:  [id number] Principal Diagnosis: Alcohol abuse with alcohol-induced mood disorder (HCC) Diagnosis:  Principal Problem:   Alcohol abuse with alcohol-induced mood disorder (HCC) Active Problems:   HIV disease (HCC)   Alcohol abuse   Alcohol withdrawal (HCC)   Amphetamine abuse (HCC)   Total Time spent with patient: 30 minutes  Subjective:   Gary [name] is a 34 y.o. male patient admitted with "I need help".  HPI: See note above.  Presented to the hospital intoxicated.  Patient has not been able to stop drinking despite multiple visits to the emergency room.  Usually presents with agitated sometimes inappropriate or blackout delirious behavior when intoxicated.  Drug screen also positive for amphetamines.  Not currently engaged in any outpatient mental health treatment.  Not reporting suicidal ideation.  Denies any hallucinations today.  Denies suicidal thoughts.  Past Psychiatric History: Longstanding problems with alcohol abuse with minimal sobriety  Risk to Self: Suicidal Ideation: Yes-Currently Present Suicidal Intent: Yes-Currently Present Is patient at risk for suicide?: No Suicidal Plan?: No Specify Current Suicidal Plan: None Specify Access to Suicidal Means: None What has  been your use of drugs/alcohol within the last 12 months?: Alcohol Abuse How many times?: 2 Other Self Harm Risks: None Triggers for Past Attempts: Spouse contact,Other (Comment) Intentional Self Injurious Behavior: None Risk to Others: Homicidal Ideation: No Thoughts of Harm to Others: No Current Homicidal Intent: No Current Homicidal Plan: No Access to Homicidal Means: No Identified Victim: None History of harm to others?: No Assessment of Violence: None Noted Violent Behavior Description: None Does patient have access to weapons?: No Criminal Charges Pending?: Yes Describe Pending Criminal Charges: Patient refused to share Does patient have a court date: No Prior Inpatient Therapy: Prior Inpatient Therapy: Yes Prior Therapy Dates: 04/2019 Prior Therapy Facilty/Provider(s): [facility] Reason for Treatment: Major Depression/SA Prior Outpatient Therapy: Prior Outpatient Therapy: No Does patient have an ACCT team?: No Does patient have Intensive In-House Services?  : No Does patient have [facility] services? : No Does patient have P4CC services?: No  Past Medical History:  Past Medical History:  Diagnosis Date  . Alcohol abuse   . HIV (human immunodeficiency virus infection) (HCC)   . Subdural hematoma [facility])     Past Surgical History:  Procedure Laterality Date  . INCISION AND DRAINAGE PERIRECTAL ABSCESS N/A 09/24/2016   Procedure: IRRIGATION AND DEBRIDEMENT PERIRECTAL ABSCESS;  Surgeon: Ricarda Frame, MD;  Location: [facility] ORS;  Service: General;  Laterality: N/A;  . none     Family History:  Family History  Problem Relation Age of Onset  . Alcohol abuse Mother   . Alcohol abuse Father    Family Psychiatric  History: See previous Social History:  Social History   Substance and Sexual Activity  Alcohol Use Yes     Social History  Substance and Sexual Activity  Drug Use Not Currently  . Types: Marijuana, Methamphetamines   Comment: last used 04/10/2019    Social  History   Socioeconomic History  . Marital status: Single    Spouse name: Not on file  . Number of children: Not on file  . Years of education: Not on file  . Highest education level: Not on file  Occupational History  . Occupation: unemployed  Tobacco Use  . Smoking status: Former Smoker    Packs/day: 0.50    Types: Cigarettes  . Smokeless tobacco: Never Used  . Tobacco comment: unable to smoke while incarcerated 6+ months 02/13/20  Vaping Use  . Vaping Use: Never used  Substance and Sexual Activity  . Alcohol use: Yes  . Drug use: Not Currently    Types: Marijuana, Methamphetamines    Comment: last used 04/10/2019  . Sexual activity: Not Currently    Comment: unable to offer condoms while incarcerated  Other Topics Concern  . Not on file  Social History Narrative   "Currently living on the streets"   Independent at baseline   Social Determinants of Health   Financial Resource Strain: Not on file  Food Insecurity: Not on file  Transportation Needs: Not on file  Physical Activity: Not on file  Stress: Not on file  Social Connections: Not on file   Additional Social History:    Allergies:   Allergies  Allergen Reactions  . Tegretol [Carbamazepine] Other (See Comments)    Caused vertigo for 2 days after taking it  . Caffeine Palpitations    Labs:  Results for orders placed or performed during the hospital encounter of 10/19/20 (from the past 48 hour(s))  Urine Drug Screen, Qualitative     Status: Abnormal   Collection Time: 10/20/20 12:17 AM  Result Value Ref Range   Tricyclic, Ur Screen NONE DETECTED NONE DETECTED   Amphetamines, Ur Screen POSITIVE (A) NONE DETECTED   MDMA (Ecstasy)Ur Screen NONE DETECTED NONE DETECTED   Cocaine Metabolite,Ur [employer] NONE DETECTED NONE DETECTED   Opiate, Ur Screen NONE DETECTED NONE DETECTED   Phencyclidine (PCP) Ur S NONE DETECTED NONE DETECTED   Cannabinoid 50 Ng, Ur [place] NONE DETECTED NONE DETECTED   Barbiturates, Ur Screen NONE  DETECTED NONE DETECTED   Benzodiazepine, Ur Scrn POSITIVE (A) NONE DETECTED   Methadone Scn, Ur NONE DETECTED NONE DETECTED    Comment: (NOTE) Tricyclics + metabolites, urine    Cutoff 1000 ng/mL Amphetamines + metabolites, urine  Cutoff 1000 ng/mL MDMA (Ecstasy), urine              Cutoff 500 ng/mL Cocaine Metabolite, urine          Cutoff 300 ng/mL Opiate + metabolites, urine        Cutoff 300 ng/mL Phencyclidine (PCP), urine         Cutoff 25 ng/mL Cannabinoid, urine                 Cutoff 50 ng/mL Barbiturates + metabolites, urine  Cutoff 200 ng/mL Benzodiazepine, urine              Cutoff 200 ng/mL Methadone, urine                   Cutoff 300 ng/mL  The urine drug screen provides only a preliminary, unconfirmed analytical test result and should not be used for non-medical purposes. Clinical consideration and professional judgment should be applied to any positive drug screen result due  to possible interfering substances. A more specific alternate chemical method must be used in order to obtain a confirmed analytical result. Gas chromatography / mass spectrometry (GC/MS) is the preferred confirm atory method. Performed at [facility], [address] [place], [place] [address]   CBC with Differential     Status: Abnormal   Collection Time: 10/20/20 12:55 AM  Result Value Ref Range   WBC 4.4 4.0 - 10.5 K/uL   RBC 4.10 (L) 4.22 - 5.81 MIL/uL   Hemoglobin 13.7 13.0 - 17.0 g/dL   HCT 34.1 96.2 - 22.9 %   MCV 96.1 80.0 - 100.0 fL   MCH 33.4 26.0 - 34.0 pg   MCHC 34.8 30.0 - 36.0 g/dL   RDW 79.8 92.1 - 19.4 %   Platelets 121 (L) 150 - 400 K/uL    Comment: Immature Platelet Fraction may be clinically indicated, consider ordering this additional test [id number]    nRBC 0.0 0.0 - 0.2 %   Neutrophils Relative % 62 %   Neutro Abs 2.7 1.7 - 7.7 K/uL   Lymphocytes Relative 28 %   Lymphs Abs 1.2 0.7 - 4.0 K/uL   Monocytes Relative 8 %   Monocytes Absolute 0.4 0.1 -  1.0 K/uL   Eosinophils Relative 1 %   Eosinophils Absolute 0.0 0.0 - 0.5 K/uL   Basophils Relative 1 %   Basophils Absolute 0.0 0.0 - 0.1 K/uL   Immature Granulocytes 0 %   Abs Immature Granulocytes 0.01 0.00 - 0.07 K/uL    Comment: Performed at [facility], [address] [place], [place] [address]  Comprehensive metabolic panel     Status: Abnormal   Collection Time: 10/20/20 12:55 AM  Result Value Ref Range   Sodium 134 (L) 135 - 145 mmol/L   Potassium 3.5 3.5 - 5.1 mmol/L   Chloride 99 98 - 111 mmol/L   CO2 24 22 - 32 mmol/L   Glucose, Bld 80 70 - 99 mg/dL    Comment: Glucose reference range applies only to samples taken after fasting for at least 8 hours.   BUN 8 6 - 20 mg/dL   Creatinine, Ser 3.14 0.61 - 1.24 mg/dL   Calcium 8.6 (L) 8.9 - 10.3 mg/dL   Total Protein 7.8 6.5 - 8.1 g/dL   Albumin 4.0 3.5 - 5.0 g/dL   AST 40 15 - 41 U/L   ALT 29 0 - 44 U/L   Alkaline Phosphatase 91 38 - 126 U/L   Total Bilirubin 1.0 0.3 - 1.2 mg/dL   GFR, Estimated >97 >02 mL/min    Comment: (NOTE) Calculated using the CKD-EPI Creatinine Equation (2021)    Anion gap 11 5 - 15    Comment: Performed at [facility], [address], [place], [place] [address]  Ethanol     Status: Abnormal   Collection Time: 10/20/20 12:55 AM  Result Value Ref Range   Alcohol, Ethyl (B) 267 (H) <10 mg/dL    Comment: (NOTE) Lowest detectable limit for serum alcohol is 10 mg/dL.  For medical purposes only. Performed at [facility], [address] [place], [place] [address]   Acetaminophen level     Status: Abnormal   Collection Time: 10/20/20 12:55 AM  Result Value Ref Range   Acetaminophen (Tylenol), Serum <10 (L) 10 - 30 ug/mL    Comment: (NOTE) Therapeutic concentrations vary significantly. A range of 10-30 ug/mL  may be an effective concentration for many patients. However, some  are best treated at concentrations outside of this range. Acetaminophen concentrations  >150 ug/mL at 4 hours after ingestion  and >50 ug/mL at 12 hours after ingestion are often associated with  toxic reactions.  Performed at [facility], [address] [place], [place] [address]   Salicylate level     Status: Abnormal   Collection Time: 10/20/20 12:55 AM  Result Value Ref Range   Salicylate Lvl <7.0 (L) 7.0 - 30.0 mg/dL    Comment: Performed at [facility], [address] [place], [place] [address]  Resp Panel by RT-PCR (Flu A&B, Covid) Nasopharyngeal Swab     Status: None   Collection Time: 10/20/20  2:01 PM   Specimen: Nasopharyngeal Swab; Nasopharyngeal(NP) swabs in vial transport medium  Result Value Ref Range   SARS Coronavirus 2 by RT PCR NEGATIVE NEGATIVE    Comment: (NOTE) SARS-CoV-2 target nucleic acids are NOT DETECTED.  The SARS-CoV-2 RNA is generally detectable in upper respiratory specimens during the acute phase of infection. The lowest concentration of SARS-CoV-2 viral copies this assay can detect is 138 copies/mL. A negative result does not preclude SARS-Cov-2 infection and should not be used as the sole basis for treatment or other patient management decisions. A negative result may occur with  improper specimen collection/handling, submission of specimen other than nasopharyngeal swab, presence of viral mutation(s) within the areas targeted by this assay, and inadequate number of viral copies(<138 copies/mL). A negative result must be combined with clinical observations, patient history, and epidemiological information. The expected result is Negative.  Fact Sheet for Patients:  BloggerCourse.com  Fact Sheet for Healthcare Providers:  SeriousBroker.it  This test is no t yet approved or cleared by the [place] FDA and  has been authorized for detection and/or diagnosis of SARS-CoV-2 by FDA under an Emergency Use Authorization (EUA). This EUA will remain  in effect  (meaning this test can be used) for the duration of the COVID-19 declaration under Section 564(b)(1) of the Act, 21 U.S.C.section 360bbb-3(b)(1), unless the authorization is terminated  or revoked sooner.       Influenza A by PCR NEGATIVE NEGATIVE   Influenza B by PCR NEGATIVE NEGATIVE    Comment: (NOTE) The Xpert Xpress SARS-CoV-2/FLU/RSV plus assay is intended as an aid in the diagnosis of influenza from Nasopharyngeal swab specimens and should not be used as a sole basis for treatment. Nasal washings and aspirates are unacceptable for Xpert Xpress SARS-CoV-2/FLU/RSV testing.  Fact Sheet for Patients: BloggerCourse.com  Fact Sheet for Healthcare Providers: SeriousBroker.it  This test is not yet approved or cleared by the [place] FDA and has been authorized for detection and/or diagnosis of SARS-CoV-2 by FDA under an Emergency Use Authorization (EUA). This EUA will remain in effect (meaning this test can be used) for the duration of the COVID-19 declaration under Section 564(b)(1) of the Act, 21 U.S.C. section 360bbb-3(b)(1), unless the authorization is terminated or revoked.  Performed at [facility], [address], [place], [place] [address]     Current Facility-Administered Medications  Medication Dose Route Frequency Provider Last Rate Last Admin  . LORazepam (ATIVAN) injection 0-4 mg  0-4 mg Intravenous Q6H Irean Hong, MD   2 mg at 10/20/20 1115   Or  . LORazepam (ATIVAN) tablet 0-4 mg  0-4 mg Oral Q6H Irean Hong, MD   2 mg at 10/21/20 1214  . thiamine tablet 100 mg  100 mg Oral Daily Irean Hong, MD   100  mg at 10/21/20 1009   Or  . thiamine (B-1) injection 100 mg  100 mg Intravenous Daily Irean HongSung, Jade J, MD       Current Outpatient Medications  Medication Sig Dispense Refill  . elvitegravir-cobicistat-emtricitabine-tenofovir (GENVOYA) 150-150-200-10 MG TABS tablet Take 1 tablet by mouth daily  with breakfast. 30 tablet 0  . FLUoxetine (PROZAC) 20 MG capsule Take 1 capsule (20 mg total) by mouth daily. (Patient not taking: Reported on 10/19/2020) 30 capsule 0  . nicotine (NICODERM CQ - DOSED IN MG/24 HOURS) 21 mg/24hr patch Place 1 patch (21 mg total) onto the skin daily. (Patient not taking: No sig reported) 28 patch 0  . pantoprazole (PROTONIX) 40 MG tablet Take 1 tablet (40 mg total) by mouth daily. (Patient not taking: Reported on 10/19/2020) 30 tablet 0    Musculoskeletal: Strength & Muscle Tone: decreased Gait & Station: unsteady Patient leans: N/A  Psychiatric Specialty Exam: Physical Exam Vitals and nursing note reviewed.  Constitutional:      Appearance: He is well-developed and well-nourished.  HENT:     Head: Normocephalic and atraumatic.  Eyes:     Conjunctiva/sclera: Conjunctivae normal.     Pupils: Pupils are equal, round, and reactive to light.  Cardiovascular:     Heart sounds: Normal heart sounds.  Pulmonary:     Effort: Pulmonary effort is normal.  Abdominal:     Palpations: Abdomen is soft.  Musculoskeletal:        General: Normal range of motion.     Cervical back: Normal range of motion.  Skin:    General: Skin is warm and dry.  Neurological:     General: No focal deficit present.     Mental Status: He is alert.  Psychiatric:        Attention and Perception: He is inattentive.        Mood and Affect: Mood is depressed.        Speech: Speech is delayed.        Behavior: Behavior is slowed.        Thought Content: Thought content does not include homicidal or suicidal ideation.        Cognition and Memory: Memory is impaired.        Judgment: Judgment is impulsive.     Review of Systems  Constitutional: Positive for fatigue.  HENT: Negative.   Eyes: Negative.   Respiratory: Negative.   Cardiovascular: Negative.   Gastrointestinal: Negative.   Musculoskeletal: Negative.   Skin: Negative.   Neurological: Negative.    Psychiatric/Behavioral: The patient is nervous/anxious.     Blood pressure 125/74, pulse 88, temperature 98.3 F (36.8 C), temperature source Oral, resp. rate 16, height 6\' 1"  (1.854 m), weight 68 kg, SpO2 98 %.Body mass index is 19.79 kg/m.  General Appearance: Disheveled  Eye Contact:  Minimal  Speech:  Slurred  Volume:  Decreased  Mood:  Dysphoric  Affect:  Constricted  Thought Process:  Coherent  Orientation:  Full (Time, Place, and Person)  Thought Content:  Logical  Suicidal Thoughts:  No  Homicidal Thoughts:  No  Memory:  Immediate;   Fair Recent;   Poor Remote;   Fair  Judgement:  Impaired  Insight:  Shallow  Psychomotor Activity:  Decreased  Concentration:  Concentration: Poor  Recall:  Poor  Fund of Knowledge:  Poor  Language:  Fair  Akathisia:  No  Handed:  Right  AIMS (if indicated):     Assets:  Desire for Improvement  ADL's:  Impaired  Cognition:  Impaired,  Mild  Sleep:        Treatment Plan Summary: Daily contact with patient to assess and evaluate symptoms and progress in treatment, Medication management and Plan Patient is complaining of alcohol withdrawal symptoms although his vital signs remained stable.  Has not had a seizure.  As far as I know has not had any alcohol withdrawal seizures that we have recorded.  Patient often will complain of severe withdrawal subjective symptoms while being hemodynamically stable.  Often gets irritable during detox but no known episodes of DTs.  Continue with CIWA protocol as needed medicine.  Patient has been referred out for possible rehab substance abuse treatment.  We will follow-up with TTS.  No psychiatric medicine at this time other than detox.  Patient should probably be on his regular HIV medicine and we will check on that.  Disposition: Substance abuse rehab  Mordecai Rasmussen, MD 10/21/2020 1:51 PM

## 2020-10-21 NOTE — BH Assessment (Signed)
Pt is currently completing [facility] screening with [facility]

## 2020-10-21 NOTE — ED Notes (Signed)
Report to include Situation, Background, Assessment, and Recommendations received from William RN. Patient alert and oriented, warm and dry, in no acute distress. Patient denies SI, HI, AVH and pain. Patient made aware of Q15 minute rounds and security cameras for their safety. Patient instructed to come to me with needs or concerns.  

## 2020-10-21 NOTE — ED Notes (Signed)
Pt observed sleeping soundly  VS to be obtained when he awakens

## 2020-10-21 NOTE — ED Notes (Signed)
Patient said that he is not feeling well. He is having withdrawal symptoms. He is nauseated and threw up his ativan. EDP notified.

## 2020-10-21 NOTE — ED Notes (Signed)
VOL  PENDING  PLACEMENT 

## 2020-10-21 NOTE — ED Provider Notes (Signed)
Emergency Medicine Observation Re-evaluation Note  Gary [name] is a 34 y.o. male, seen on rounds today.  Pt initially presented to the ED for complaints of Fall and Alcohol Intoxication Currently, the patient is resting comfortably, no distress.Marland Kitchen  Physical Exam  BP 123/89   Pulse (!) 101   Temp 98.5 F (36.9 C) (Oral)   Resp 18   Ht 6\' 1"  (1.854 m)   Wt 68 kg   SpO2 98%   BMI 19.79 kg/m  Physical Exam General: Calm cooperative. Cardiac: Regular rate and rhythm around 100 bpm Lungs: Clear to auscultation bilaterally Psych: Calm, pleasant  ED Course / MDM   Patient had lab work performed yesterday only significant finding is elevated ethanol level of 267 as well as amphetamine positive urine toxicology.  Patient had received benzodiazepines during his prior ED and hospital admission.  Patient states after being discharged he used alcohol and methamphetamines once again.  Plan  Current plan is for evaluation and possible placement to detox.. Patient is not under full IVC at this time.   , MD 10/21/20 1130

## 2020-10-21 NOTE — ED Notes (Signed)
Refused Snack and beverage given.

## 2020-10-21 NOTE — BH Assessment (Signed)
Referral information for detox treatment faxed to:   [facility] [phone] or [phone])   [facility] [phone])  . [facility] [phone])  . [facility] [phone])  . [facility] [phone])

## 2020-10-21 NOTE — ED Notes (Signed)
No v-signs patient sleeping at this time 

## 2020-10-21 NOTE — ED Notes (Signed)
Vol/Per SOC, pt is recommended for Detox treatment.

## 2020-10-21 NOTE — ED Notes (Signed)
Patient refused po Ativan, states its just candy and it won't work.  I need the shot.  Explained that for detox facilities he would need to be taking medications by mouth, patient continues to refuse.

## 2020-10-21 NOTE — BH Assessment (Signed)
Referral information for detox treatment faxed to:             [facility] [phone] or [phone]) Left message             [facility] [phone]) Pending negative amphetamine toxicology results.   [facility] [phone]) Per Fritzi Mandes pt would have to be self-pay; pt declined self pay rates.    [facility] [phone]) Olivia requested a refax; task completed at 8:52 PM.   .           [facility] [phone]) Pt has not been reviewed; Staff agreed to call back.

## 2020-10-21 NOTE — BH Assessment (Signed)
TTS spoke to [facility]) who reports pt to be denied due to pt's preference to seek detox only and not treatment. Drema Pry denies pt qualification for their detox program.   TTS attempted to follow up with [facility] referral and received no answer.   TTS will continue to seek detox treatment accordingly.

## 2020-10-21 NOTE — BH Assessment (Signed)
TTS was contacted by Eber Jones ([facility]) reporting the need for pt to be cleared of methamphetamines before he is able to be reviewed and accepted. Eber Jones request to be contacted once pt's UDS is clear of methamphetamines.   TTS updated pt's current MD Derrill Kay), RN ([place]) and night TTS

## 2020-10-21 NOTE — ED Notes (Signed)
Patient sleeping, vital signs deferred. 

## 2020-10-22 MED ORDER — CHLORDIAZEPOXIDE HCL 25 MG PO CAPS
25.0000 mg | ORAL_CAPSULE | Freq: Three times a day (TID) | ORAL | Status: AC
  Administered 2020-10-22 (×3): 25 mg via ORAL
  Filled 2020-10-22 (×3): qty 1

## 2020-10-22 MED ORDER — LORAZEPAM 1 MG PO TABS
1.0000 mg | ORAL_TABLET | Freq: Once | ORAL | Status: AC
  Administered 2020-10-22: 1 mg via ORAL
  Filled 2020-10-22: qty 1

## 2020-10-22 NOTE — ED Notes (Signed)
MD notified of CIWAA and expired ativan order

## 2020-10-22 NOTE — ED Notes (Addendum)
PT talking with psychiatrist  And TTS

## 2020-10-22 NOTE — BH Assessment (Addendum)
Attempted to contact [facility] to confirm referral and authorization code had been received from earlier, but no answer. Will continue to make attempts.   Update 10/23/20 6:18am: Attempted to make contact again, no answer, unable to leave a voicemail

## 2020-10-22 NOTE — ED Notes (Signed)
Hourly rounding reveals patient in room. No complaints, stable, in no acute distress. Q15 minute rounds and monitoring via [employer] to continue. 

## 2020-10-22 NOTE — BH Assessment (Addendum)
[facility] referral completed and faxed to [facility] for Authorization code  TTS faxed referral and authorization code to [facility] AT 5:37PM  TTS attempted to confirm receipt of referral but was unsuccessful due to no answer at 6:20pm

## 2020-10-22 NOTE — ED Notes (Signed)
EDP made aware of Bruise to R arm

## 2020-10-22 NOTE — ED Provider Notes (Signed)
Emergency Medicine Observation Re-evaluation Note  Gary [name] is a 34 y.o. male, seen on rounds today.  Pt initially presented to the ED for complaints of Fall and Alcohol Intoxication Currently, the patient is calm, resting.  Physical Exam  BP 119/83   Pulse (!) 105   Temp 98.1 F (36.7 C) (Oral)   Resp 20   Ht 6\' 1"  (1.854 m)   Wt 68 kg   SpO2 100%   BMI 19.79 kg/m  Physical Exam General: NAD Cardiac: Well perfused Lungs: Normal WOB, walking without dyspnea Psych: Calm, cooperative  ED Course / MDM  EKG:  Clinical Course as of 10/22/20 1006  Sun Oct 20, 2020  0227 Patient has been evaluated by psychiatric NP; will reassess in the morning once patient is more sober. [JS]    Clinical Course User Index [JS] Oct 22, 2020, MD   I have reviewed the labs performed to date as well as medications administered while in observation.  Recent changes in the last 24 hours include None.  Plan  Current plan is for psych disposition. Patient is not under full IVC at this time.   Irean Hong, MD 10/22/20 1054

## 2020-10-22 NOTE — Consult Note (Signed)
[facility] Face-to-Face Psychiatry Consult   Reason for Consult: Consult follow-up for this patient with alcohol abuse and recurrent behavior problems including suicidal threats Referring Physician: Isaac's Patient Identification: Gary [name] MRN:  [id number] Principal Diagnosis: Alcohol abuse with alcohol-induced mood disorder (HCC) Diagnosis:  Principal Problem:   Alcohol abuse with alcohol-induced mood disorder (HCC) Active Problems:   HIV disease (HCC)   Alcohol abuse   Alcohol withdrawal (HCC)   Amphetamine abuse (HCC)   Total Time spent with patient: 30 minutes  Subjective:   Gary [name] is a 34 y.o. male patient admitted with "I am not feeling good".  HPI: Patient seen for follow-up.  34 year old man with alcohol abuse and multiple visits to the ER.  Vitals were not very abnormal but he is unsteady on his feet still has some tremulousness seems cognitively slowed.  Patient had previously voiced to staff that he did not want to go for rehab treatment.  He is requesting more medicine for detox today.  Past Psychiatric History: Multiple visits to the emergency room for heavy alcohol abuse.  Risk to Self: Suicidal Ideation: Yes-Currently Present Suicidal Intent: Yes-Currently Present Is patient at risk for suicide?: No Suicidal Plan?: No Specify Current Suicidal Plan: None Specify Access to Suicidal Means: None What has been your use of drugs/alcohol within the last 12 months?: Alcohol Abuse How many times?: 2 Other Self Harm Risks: None Triggers for Past Attempts: Spouse contact,Other (Comment) Intentional Self Injurious Behavior: None Risk to Others: Homicidal Ideation: No Thoughts of Harm to Others: No Current Homicidal Intent: No Current Homicidal Plan: No Access to Homicidal Means: No Identified Victim: None History of harm to others?: No Assessment of Violence: None Noted Violent Behavior Description: None Does patient have access to weapons?: No Criminal  Charges Pending?: Yes Describe Pending Criminal Charges: Patient refused to share Does patient have a court date: No Prior Inpatient Therapy: Prior Inpatient Therapy: Yes Prior Therapy Dates: 04/2019 Prior Therapy Facilty/Provider(s): [facility] Reason for Treatment: Major Depression/SA Prior Outpatient Therapy: Prior Outpatient Therapy: No Does patient have an ACCT team?: No Does patient have Intensive In-House Services?  : No Does patient have [facility] services? : No Does patient have P4CC services?: No  Past Medical History:  Past Medical History:  Diagnosis Date  . Alcohol abuse   . HIV (human immunodeficiency virus infection) (HCC)   . Subdural hematoma [facility])     Past Surgical History:  Procedure Laterality Date  . INCISION AND DRAINAGE PERIRECTAL ABSCESS N/A 09/24/2016   Procedure: IRRIGATION AND DEBRIDEMENT PERIRECTAL ABSCESS;  Surgeon: Ricarda Frame, MD;  Location: [facility] ORS;  Service: General;  Laterality: N/A;  . none     Family History:  Family History  Problem Relation Age of Onset  . Alcohol abuse Mother   . Alcohol abuse Father    Family Psychiatric  History: See previous Social History:  Social History   Substance and Sexual Activity  Alcohol Use Yes     Social History   Substance and Sexual Activity  Drug Use Not Currently  . Types: Marijuana, Methamphetamines   Comment: last used 04/10/2019    Social History   Socioeconomic History  . Marital status: Single    Spouse name: Not on file  . Number of children: Not on file  . Years of education: Not on file  . Highest education level: Not on file  Occupational History  . Occupation: unemployed  Tobacco Use  . Smoking status: Former Smoker    Packs/day:  0.50    Types: Cigarettes  . Smokeless tobacco: Never Used  . Tobacco comment: unable to smoke while incarcerated 6+ months 02/13/20  Vaping Use  . Vaping Use: Never used  Substance and Sexual Activity  . Alcohol use: Yes  . Drug use: Not  Currently    Types: Marijuana, Methamphetamines    Comment: last used 04/10/2019  . Sexual activity: Not Currently    Comment: unable to offer condoms while incarcerated  Other Topics Concern  . Not on file  Social History Narrative   "Currently living on the streets"   Independent at baseline   Social Determinants of Health   Financial Resource Strain: Not on file  Food Insecurity: Not on file  Transportation Needs: Not on file  Physical Activity: Not on file  Stress: Not on file  Social Connections: Not on file   Additional Social History:    Allergies:   Allergies  Allergen Reactions  . Tegretol [Carbamazepine] Other (See Comments)    Caused vertigo for 2 days after taking it  . Caffeine Palpitations    Labs:  Results for orders placed or performed during the hospital encounter of 10/19/20 (from the past 48 hour(s))  Resp Panel by RT-PCR (Flu A&B, Covid) Nasopharyngeal Swab     Status: None   Collection Time: 10/20/20  2:01 PM   Specimen: Nasopharyngeal Swab; Nasopharyngeal(NP) swabs in vial transport medium  Result Value Ref Range   SARS Coronavirus 2 by RT PCR NEGATIVE NEGATIVE    Comment: (NOTE) SARS-CoV-2 target nucleic acids are NOT DETECTED.  The SARS-CoV-2 RNA is generally detectable in upper respiratory specimens during the acute phase of infection. The lowest concentration of SARS-CoV-2 viral copies this assay can detect is 138 copies/mL. A negative result does not preclude SARS-Cov-2 infection and should not be used as the sole basis for treatment or other patient management decisions. A negative result may occur with  improper specimen collection/handling, submission of specimen other than nasopharyngeal swab, presence of viral mutation(s) within the areas targeted by this assay, and inadequate number of viral copies(<138 copies/mL). A negative result must be combined with clinical observations, patient history, and epidemiological information. The  expected result is Negative.  Fact Sheet for Patients:  BloggerCourse.com  Fact Sheet for Healthcare Providers:  SeriousBroker.it  This test is no t yet approved or cleared by the [place] FDA and  has been authorized for detection and/or diagnosis of SARS-CoV-2 by FDA under an Emergency Use Authorization (EUA). This EUA will remain  in effect (meaning this test can be used) for the duration of the COVID-19 declaration under Section 564(b)(1) of the Act, 21 U.S.C.section 360bbb-3(b)(1), unless the authorization is terminated  or revoked sooner.       Influenza A by PCR NEGATIVE NEGATIVE   Influenza B by PCR NEGATIVE NEGATIVE    Comment: (NOTE) The Xpert Xpress SARS-CoV-2/FLU/RSV plus assay is intended as an aid in the diagnosis of influenza from Nasopharyngeal swab specimens and should not be used as a sole basis for treatment. Nasal washings and aspirates are unacceptable for Xpert Xpress SARS-CoV-2/FLU/RSV testing.  Fact Sheet for Patients: BloggerCourse.com  Fact Sheet for Healthcare Providers: SeriousBroker.it  This test is not yet approved or cleared by the [place] FDA and has been authorized for detection and/or diagnosis of SARS-CoV-2 by FDA under an Emergency Use Authorization (EUA). This EUA will remain in effect (meaning this test can be used) for the duration of the COVID-19 declaration under Section 564(b)(1) of  the Act, 21 U.S.C. section 360bbb-3(b)(1), unless the authorization is terminated or revoked.  Performed at [facility], [address], [place], [place] [address]     Current Facility-Administered Medications  Medication Dose Route Frequency Provider Last Rate Last Admin  . chlordiazePOXIDE (LIBRIUM) capsule 25 mg  25 mg Oral TID Zellie Jenning, Jackquline Denmark, MD   25 mg at 10/22/20 0949  . thiamine tablet 100 mg  100 mg Oral Daily Irean Hong, MD   100 mg at 10/22/20 5974   Or  . thiamine (B-1) injection 100 mg  100 mg Intravenous Daily Irean Hong, MD       Current Outpatient Medications  Medication Sig Dispense Refill  . elvitegravir-cobicistat-emtricitabine-tenofovir (GENVOYA) 150-150-200-10 MG TABS tablet Take 1 tablet by mouth daily with breakfast. 30 tablet 0  . FLUoxetine (PROZAC) 20 MG capsule Take 1 capsule (20 mg total) by mouth daily. (Patient not taking: Reported on 10/19/2020) 30 capsule 0  . nicotine (NICODERM CQ - DOSED IN MG/24 HOURS) 21 mg/24hr patch Place 1 patch (21 mg total) onto the skin daily. (Patient not taking: No sig reported) 28 patch 0  . pantoprazole (PROTONIX) 40 MG tablet Take 1 tablet (40 mg total) by mouth daily. (Patient not taking: Reported on 10/19/2020) 30 tablet 0    Musculoskeletal: Strength & Muscle Tone: decreased Gait & Station: unsteady Patient leans: N/A  Psychiatric Specialty Exam: Physical Exam Vitals and nursing note reviewed.  Constitutional:      Appearance: He is well-developed and well-nourished.  HENT:     Head: Normocephalic and atraumatic.  Eyes:     Conjunctiva/sclera: Conjunctivae normal.     Pupils: Pupils are equal, round, and reactive to light.  Cardiovascular:     Heart sounds: Normal heart sounds.  Pulmonary:     Effort: Pulmonary effort is normal.  Abdominal:     Palpations: Abdomen is soft.  Musculoskeletal:        General: Normal range of motion.     Cervical back: Normal range of motion.  Skin:    General: Skin is warm and dry.  Neurological:     Mental Status: He is alert. He is disoriented.     Motor: Weakness present.  Psychiatric:        Attention and Perception: He is inattentive.        Mood and Affect: Affect is blunt.        Speech: Speech is delayed.     Review of Systems  Constitutional: Positive for fatigue.  HENT: Negative.   Eyes: Negative.   Respiratory: Negative.   Cardiovascular: Negative.   Gastrointestinal: Negative.    Musculoskeletal: Negative.   Skin: Negative.   Neurological: Positive for dizziness.  Psychiatric/Behavioral: Positive for confusion.    Blood pressure 119/83, pulse (!) 105, temperature 98.1 F (36.7 C), temperature source Oral, resp. rate 20, height 6\' 1"  (1.854 m), weight 68 kg, SpO2 100 %.Body mass index is 19.79 kg/m.  General Appearance: Disheveled  Eye Contact:  Fair  Speech:  Slow  Volume:  Decreased  Mood:  Dysphoric  Affect:  Blunt  Thought Process:  Disorganized  Orientation:  NA  Thought Content:  Rumination  Suicidal Thoughts:  No  Homicidal Thoughts:  No  Memory:  Immediate;   Fair Recent;   Poor Remote;   Poor  Judgement:  Impaired  Insight:  Shallow  Psychomotor Activity:  Decreased  Concentration:  Concentration: Poor  Recall:  Poor  Fund of Knowledge:  Fair  Language:  Fair  Akathisia:  No  Handed:  Right  AIMS (if indicated):     Assets:  Resilience  ADL's:  Impaired  Cognition:  Impaired,  Mild  Sleep:        Treatment Plan Summary: Daily contact with patient to assess and evaluate symptoms and progress in treatment, Medication management and Plan Patient continues to display signs of withdrawal and continues to appear to be weak and cognitively slowed.  We have referred him for inpatient treatment and so far come up short.  Patient in part had been turned down because of his resistance to getting any kind of rehabilitation treatment.  I have proposed to [facility] that we refer the patient to [facility].  If needed we could certainly put him under IVC.  Patient's judgment is poor and he has had repeated hospitalizations without any outpatient follow-through.  Restarted low dose Librium for another day and we will play it by ear as far as withdrawal.  Disposition: Continue working on substance abuse treatment rehab referral  Mordecai RasmussenJohn Ikenna Ohms, MD 10/22/2020 11:15 AM

## 2020-10-22 NOTE — ED Notes (Signed)
Pt. Got sandwich tray and a drink.  °

## 2020-10-22 NOTE — ED Notes (Signed)
Pt. Alert and oriented, warm and dry, in no distress. Pt. Denies SI, HI, and AVH. Pt. Encouraged to let nursing staff know of any concerns or needs.  ENVIRONMENTAL ASSESSMENT Potentially harmful objects out of patient reach: Yes.   Personal belongings secured: Yes.   Patient dressed in hospital provided attire only: Yes.   Plastic bags out of patient reach: Yes.   Patient care equipment (cords, cables, call bells, lines, and drains) shortened, removed, or accounted for: Yes.   Equipment and supplies removed from bottom of stretcher: Yes.   Potentially toxic materials out of patient reach: Yes.   Sharps container removed or out of patient reach: Yes.  ' 

## 2020-10-23 MED ORDER — ONDANSETRON 4 MG PO TBDP
4.0000 mg | ORAL_TABLET | Freq: Three times a day (TID) | ORAL | Status: DC | PRN
  Administered 2020-10-23: 4 mg via ORAL
  Filled 2020-10-23 (×3): qty 1

## 2020-10-23 NOTE — ED Notes (Signed)
Patient in bathroom throwing up when he came out of restroom patient came to nurses station and states, "I changed my mind I don't wont to leave. I need to go to a detox rehab center." This writer praised patient to changing his mind and that is a good decision.

## 2020-10-23 NOTE — ED Notes (Signed)
Patient asking to be discharged due to not getting ativan for his detox. This Clinical research associate explained to patient that he had been getting librium for his  Symptoms. Patient states that has not been enough and he wants to leave so he can drink. Patient states he is going to self detox off alcohol by weaning down the amount he drinks.

## 2020-10-23 NOTE — BH Assessment (Addendum)
Referral information for detox treatment faxed to:  [facility] [phone] or [phone]): No answer  [facility] [phone]):Faxed out and received call back from [facility] stating patient will be placed on wait list for long term treatment but in the meantime, she would like for patient to participate in [facility] at [facility] until bed is available. Eber Jones states she will speak with Lorella Nimrod in the morning.      Mar[facility] KitchenJet[place] [phone]): No answer.   Sharyne Richters [phone]): Spoke with [place]. Faxed to [phone] and staff will review.

## 2020-10-23 NOTE — ED Notes (Signed)
Unable to obtain vitals due to patient sleeping. Will continue to monitor.   

## 2020-10-23 NOTE — ED Provider Notes (Signed)
Emergency Medicine Observation Re-evaluation Note  Gary [name] is a 34 y.o. male, seen on rounds today.  Pt initially presented to the ED for complaints of Fall and Alcohol Intoxication Currently, the patient is calm.  Physical Exam  BP 100/65 (BP Location: Left Arm)   Pulse 60   Temp 98 F (36.7 C) (Oral)   Resp 16   Ht 6\' 1"  (1.854 m)   Wt 68 kg   SpO2 98%   BMI 19.79 kg/m  Physical Exam General: nad Lungs: unlabored breathing Psych: calm and cooperative  ED Course / MDM  EKG:  Clinical Course as of 10/23/20 1105  Sun Oct 20, 2020  0227 Patient has been evaluated by psychiatric NP; will reassess in the morning once patient is more sober. [JS]    Clinical Course User Index [JS] Oct 22, 2020, MD   I have reviewed the labs performed to date as well as medications administered while in observation.  Recent changes in the last 24 hours include none.  Plan  Current plan is for psych re-eval and disposition. Patient is not under full IVC at this time.   Irean Hong, MD 10/23/20 1106

## 2020-10-23 NOTE — ED Notes (Signed)
EDP made aware of patient wanting to leave AMA.

## 2020-10-23 NOTE — Consult Note (Signed)
[facility] Face-to-Face Psychiatry Consult   Reason for Consult: Consult for 34 year old man following up on his alcohol withdrawal Referring Physician: Scotty Court Patient Identification: Gary [name] MRN:  [id number] Principal Diagnosis: Alcohol abuse with alcohol-induced mood disorder (HCC) Diagnosis:  Principal Problem:   Alcohol abuse with alcohol-induced mood disorder (HCC) Active Problems:   HIV disease (HCC)   Alcohol abuse   Alcohol withdrawal (HCC)   Amphetamine abuse (HCC)   Total Time spent with patient: 30 minutes  Subjective:   Gary [name] is a 34 y.o. male patient admitted with "detox".  HPI: Patient reports he still feels like he needs "detox".  Vitals are stable.  He still looks sleepy and rundown but he certainly has not been eating well recently and has other reasons to be in poor health.  Has a bit of a tremor some of it may be exaggerated.  No sign of delirium.  Not reporting any suicidal thoughts  Past Psychiatric History: Past history of multiple recurrent presentations intoxicated  Risk to Self: Suicidal Ideation: Yes-Currently Present Suicidal Intent: Yes-Currently Present Is patient at risk for suicide?: No Suicidal Plan?: No Specify Current Suicidal Plan: None Specify Access to Suicidal Means: None What has been your use of drugs/alcohol within the last 12 months?: Alcohol Abuse How many times?: 2 Other Self Harm Risks: None Triggers for Past Attempts: Spouse contact,Other (Comment) Intentional Self Injurious Behavior: None Risk to Others: Homicidal Ideation: No Thoughts of Harm to Others: No Current Homicidal Intent: No Current Homicidal Plan: No Access to Homicidal Means: No Identified Victim: None History of harm to others?: No Assessment of Violence: None Noted Violent Behavior Description: None Does patient have access to weapons?: No Criminal Charges Pending?: Yes Describe Pending Criminal Charges: Patient refused to share Does patient have  a court date: No Prior Inpatient Therapy: Prior Inpatient Therapy: Yes Prior Therapy Dates: 04/2019 Prior Therapy Facilty/Provider(s): [facility] Reason for Treatment: Major Depression/SA Prior Outpatient Therapy: Prior Outpatient Therapy: No Does patient have an ACCT team?: No Does patient have Intensive In-House Services?  : No Does patient have [facility] services? : No Does patient have P4CC services?: No  Past Medical History:  Past Medical History:  Diagnosis Date  . Alcohol abuse   . HIV (human immunodeficiency virus infection) (HCC)   . Subdural hematoma [facility])     Past Surgical History:  Procedure Laterality Date  . INCISION AND DRAINAGE PERIRECTAL ABSCESS N/A 09/24/2016   Procedure: IRRIGATION AND DEBRIDEMENT PERIRECTAL ABSCESS;  Surgeon: Ricarda Frame, MD;  Location: [facility] ORS;  Service: General;  Laterality: N/A;  . none     Family History:  Family History  Problem Relation Age of Onset  . Alcohol abuse Mother   . Alcohol abuse Father    Family Psychiatric  History: See previous.  Family history alcohol abuse Social History:  Social History   Substance and Sexual Activity  Alcohol Use Yes     Social History   Substance and Sexual Activity  Drug Use Not Currently  . Types: Marijuana, Methamphetamines   Comment: last used 04/10/2019    Social History   Socioeconomic History  . Marital status: Single    Spouse name: Not on file  . Number of children: Not on file  . Years of education: Not on file  . Highest education level: Not on file  Occupational History  . Occupation: unemployed  Tobacco Use  . Smoking status: Former Smoker    Packs/day: 0.50    Types: Cigarettes  .  Smokeless tobacco: Never Used  . Tobacco comment: unable to smoke while incarcerated 6+ months 02/13/20  Vaping Use  . Vaping Use: Never used  Substance and Sexual Activity  . Alcohol use: Yes  . Drug use: Not Currently    Types: Marijuana, Methamphetamines    Comment: last used  04/10/2019  . Sexual activity: Not Currently    Comment: unable to offer condoms while incarcerated  Other Topics Concern  . Not on file  Social History Narrative   "Currently living on the streets"   Independent at baseline   Social Determinants of Health   Financial Resource Strain: Not on file  Food Insecurity: Not on file  Transportation Needs: Not on file  Physical Activity: Not on file  Stress: Not on file  Social Connections: Not on file   Additional Social History:    Allergies:   Allergies  Allergen Reactions  . Tegretol [Carbamazepine] Other (See Comments)    Caused vertigo for 2 days after taking it  . Caffeine Palpitations    Labs: No results found for this or any previous visit (from the past 48 hour(s)).  Current Facility-Administered Medications  Medication Dose Route Frequency Provider Last Rate Last Admin  . thiamine tablet 100 mg  100 mg Oral Daily Irean Hong, MD   100 mg at 10/23/20 1009   Or  . thiamine (B-1) injection 100 mg  100 mg Intravenous Daily Irean Hong, MD       Current Outpatient Medications  Medication Sig Dispense Refill  . elvitegravir-cobicistat-emtricitabine-tenofovir (GENVOYA) 150-150-200-10 MG TABS tablet Take 1 tablet by mouth daily with breakfast. 30 tablet 0  . FLUoxetine (PROZAC) 20 MG capsule Take 1 capsule (20 mg total) by mouth daily. (Patient not taking: Reported on 10/19/2020) 30 capsule 0  . nicotine (NICODERM CQ - DOSED IN MG/24 HOURS) 21 mg/24hr patch Place 1 patch (21 mg total) onto the skin daily. (Patient not taking: No sig reported) 28 patch 0  . pantoprazole (PROTONIX) 40 MG tablet Take 1 tablet (40 mg total) by mouth daily. (Patient not taking: Reported on 10/19/2020) 30 tablet 0    Musculoskeletal: Strength & Muscle Tone: within normal limits Gait & Station: normal Patient leans: N/A  Psychiatric Specialty Exam: Physical Exam Vitals and nursing note reviewed.  Constitutional:      Appearance: He is  well-developed and well-nourished.  HENT:     Head: Normocephalic and atraumatic.  Eyes:     Conjunctiva/sclera: Conjunctivae normal.     Pupils: Pupils are equal, round, and reactive to light.  Cardiovascular:     Heart sounds: Normal heart sounds.  Pulmonary:     Effort: Pulmonary effort is normal.  Abdominal:     Palpations: Abdomen is soft.  Musculoskeletal:        General: Normal range of motion.     Cervical back: Normal range of motion.  Skin:    General: Skin is warm and dry.  Neurological:     General: No focal deficit present.     Mental Status: He is alert.  Psychiatric:        Attention and Perception: He is inattentive.        Mood and Affect: Mood is anxious. Affect is blunt.        Speech: Speech is delayed.     Review of Systems  Constitutional: Negative.   HENT: Negative.   Eyes: Negative.   Respiratory: Negative.   Cardiovascular: Negative.   Gastrointestinal: Negative.  Musculoskeletal: Negative.   Skin: Negative.   Neurological: Negative.   Psychiatric/Behavioral: The patient is nervous/anxious.     Blood pressure 100/65, pulse 60, temperature 98 F (36.7 C), temperature source Oral, resp. rate 16, height 6\' 1"  (1.854 m), weight 68 kg, SpO2 98 %.Body mass index is 19.79 kg/m.  General Appearance: Disheveled  Eye Contact:  Fair  Speech:  Slow  Volume:  Decreased  Mood:  Dysphoric  Affect:  Congruent  Thought Process:  Coherent  Orientation:  Full (Time, Place, and Person)  Thought Content:  Logical  Suicidal Thoughts:  No  Homicidal Thoughts:  No  Memory:  Immediate;   Fair Recent;   Fair Remote;   Fair  Judgement:  Fair  Insight:  Fair  Psychomotor Activity:  Tremor  Concentration:  Concentration: Fair  Recall:  of Knowledge:  Fair  Language:  Fair  Akathisia:  No  Handed:  Right  AIMS (if indicated):     Assets:  Desire for Improvement  ADL's:  Impaired  Cognition:  Impaired,  Mild  Sleep:        Treatment Plan  Summary: Plan Patient had his Librium extended by a day yesterday.  Today he does not look to me like he probably needs any further detox medicine.  We once again spoke with him about options for rehab.  Patient said this time that he thinks he probably would comply with a rehab program given his frequent hospitalizations.  We will follow-up with [facility].  Disposition: Working on referral to substance abuse treatment  [employer], MD 10/23/2020 11:12 AM

## 2020-10-23 NOTE — ED Notes (Signed)
Vol /pending placement 

## 2020-10-23 NOTE — ED Notes (Signed)
Patient sitting in day room watching TV, shower offered. Patient declined shower.

## 2020-10-23 NOTE — ED Notes (Signed)
Pt. Alert and oriented, warm and dry, in no distress. Pt. Denies SI, HI, and AVH. Pt. Encouraged to let nursing staff know of any concerns or needs.  ENVIRONMENTAL ASSESSMENT Potentially harmful objects out of patient reach: Yes.   Personal belongings secured: Yes.   Patient dressed in hospital provided attire only: Yes.   Plastic bags out of patient reach: Yes.   Patient care equipment (cords, cables, call bells, lines, and drains) shortened, removed, or accounted for: Yes.   Equipment and supplies removed from bottom of stretcher: Yes.   Potentially toxic materials out of patient reach: Yes.   Sharps container removed or out of patient reach: Yes.  ' 

## 2020-10-24 DIAGNOSIS — F1014 Alcohol abuse with alcohol-induced mood disorder: Secondary | ICD-10-CM

## 2020-10-24 MED ORDER — PROMETHAZINE HCL 25 MG PO TABS
25.0000 mg | ORAL_TABLET | Freq: Four times a day (QID) | ORAL | Status: DC | PRN
  Administered 2020-10-24 – 2020-10-26 (×5): 25 mg via ORAL
  Filled 2020-10-24 (×10): qty 1

## 2020-10-24 NOTE — BH Assessment (Signed)
Writer spoke with [facility], and they will have a bed available for him with their long-term residential program, in the event he goes to a detox/treatment.  Writer spoke with [facility] ([phone]), they initially declined him but will review him again. Writer informed them, patient is motivated for treatment and the possible bed with [facility].

## 2020-10-24 NOTE — ED Notes (Signed)
Gave [place] tray and gingerale.AS

## 2020-10-24 NOTE — ED Notes (Signed)
Pt. Alert and oriented, warm and dry, in no distress. Pt. Denies SI, HI, and AVH. Pt. Encouraged to let nursing staff know of any concerns or needs.  ENVIRONMENTAL ASSESSMENT Potentially harmful objects out of patient reach: Yes.   Personal belongings secured: Yes.   Patient dressed in hospital provided attire only: Yes.   Plastic bags out of patient reach: Yes.   Patient care equipment (cords, cables, call bells, lines, and drains) shortened, removed, or accounted for: Yes.   Equipment and supplies removed from bottom of stretcher: Yes.   Potentially toxic materials out of patient reach: Yes.   Sharps container removed or out of patient reach: Yes.  ' 

## 2020-10-24 NOTE — ED Notes (Signed)
Patient states that the phenergan helps Him and He feels like He can eat more now, Patient is calm and cooperative, states that he wants to totally detox and go to rehab and live a better life, nurse talked to him about sobriety. Patient is pleasant, denies Si/hi or avh.

## 2020-10-24 NOTE — Consult Note (Signed)
[facility] Face-to-Face Psychiatry Consult   Reason for Consult: Consult for 34 year old man.  Follow-up for alcohol abuse Referring Physician: Darnelle Catalan Patient Identification: Gary [name] MRN:  [id number] Principal Diagnosis: Alcohol abuse with alcohol-induced mood disorder (HCC) Diagnosis:  Principal Problem:   Alcohol abuse with alcohol-induced mood disorder (HCC) Active Problems:   HIV disease (HCC)   Alcohol abuse   Alcohol withdrawal (HCC)   Amphetamine abuse (HCC)   Total Time spent with patient: 30 minutes  Subjective:   Gary [name] is a 34 y.o. male patient admitted with "I am still feeling sick".  HPI: Patient with alcohol abuse with multiple hospital visits from heavy binge drinking.  Patient is having stable vital signs but says he is still feeling sick to his stomach.  Not able to eat well.  Very weak and rundown.  Dysphoric and depressed but no suicidal thoughts.  No report of hallucinations.  Feels certain that he would go back into drinking again immediately if he were released from the hospital.  Expresses willingness to be cooperative with substance abuse treatment  Past Psychiatric History: Multiple visits to the emergency room for alcohol abuse.  Not cooperative with treatment so far.  Risk to Self: Suicidal Ideation: Yes-Currently Present Suicidal Intent: Yes-Currently Present Is patient at risk for suicide?: No Suicidal Plan?: No Specify Current Suicidal Plan: None Specify Access to Suicidal Means: None What has been your use of drugs/alcohol within the last 12 months?: Alcohol Abuse How many times?: 2 Other Self Harm Risks: None Triggers for Past Attempts: Spouse contact,Other (Comment) Intentional Self Injurious Behavior: None Risk to Others: Homicidal Ideation: No Thoughts of Harm to Others: No Current Homicidal Intent: No Current Homicidal Plan: No Access to Homicidal Means: No Identified Victim: None History of harm to others?: No Assessment of  Violence: None Noted Violent Behavior Description: None Does patient have access to weapons?: No Criminal Charges Pending?: Yes Describe Pending Criminal Charges: Patient refused to share Does patient have a court date: No Prior Inpatient Therapy: Prior Inpatient Therapy: Yes Prior Therapy Dates: 04/2019 Prior Therapy Facilty/Provider(s): [facility] Reason for Treatment: Major Depression/SA Prior Outpatient Therapy: Prior Outpatient Therapy: No Does patient have an ACCT team?: No Does patient have Intensive In-House Services?  : No Does patient have [facility] services? : No Does patient have P4CC services?: No  Past Medical History:  Past Medical History:  Diagnosis Date  . Alcohol abuse   . HIV (human immunodeficiency virus infection) (HCC)   . Subdural hematoma [facility])     Past Surgical History:  Procedure Laterality Date  . INCISION AND DRAINAGE PERIRECTAL ABSCESS N/A 09/24/2016   Procedure: IRRIGATION AND DEBRIDEMENT PERIRECTAL ABSCESS;  Surgeon: Ricarda Frame, MD;  Location: [facility] ORS;  Service: General;  Laterality: N/A;  . none     Family History:  Family History  Problem Relation Age of Onset  . Alcohol abuse Mother   . Alcohol abuse Father    Family Psychiatric  History: See previous Social History:  Social History   Substance and Sexual Activity  Alcohol Use Yes     Social History   Substance and Sexual Activity  Drug Use Not Currently  . Types: Marijuana, Methamphetamines   Comment: last used 04/10/2019    Social History   Socioeconomic History  . Marital status: Single    Spouse name: Not on file  . Number of children: Not on file  . Years of education: Not on file  . Highest education level: Not on file  Occupational History  . Occupation: unemployed  Tobacco Use  . Smoking status: Former Smoker    Packs/day: 0.50    Types: Cigarettes  . Smokeless tobacco: Never Used  . Tobacco comment: unable to smoke while incarcerated 6+ months 02/13/20  Vaping  Use  . Vaping Use: Never used  Substance and Sexual Activity  . Alcohol use: Yes  . Drug use: Not Currently    Types: Marijuana, Methamphetamines    Comment: last used 04/10/2019  . Sexual activity: Not Currently    Comment: unable to offer condoms while incarcerated  Other Topics Concern  . Not on file  Social History Narrative   "Currently living on the streets"   Independent at baseline   Social Determinants of Health   Financial Resource Strain: Not on file  Food Insecurity: Not on file  Transportation Needs: Not on file  Physical Activity: Not on file  Stress: Not on file  Social Connections: Not on file   Additional Social History:    Allergies:   Allergies  Allergen Reactions  . Tegretol [Carbamazepine] Other (See Comments)    Caused vertigo for 2 days after taking it  . Caffeine Palpitations    Labs: No results found for this or any previous visit (from the past 48 hour(s)).  Current Facility-Administered Medications  Medication Dose Route Frequency Provider Last Rate Last Admin  . promethazine (PHENERGAN) tablet 25 mg  25 mg Oral Q6H PRN Viana Sleep, Jackquline Denmark, MD   25 mg at 10/24/20 1155  . thiamine tablet 100 mg  100 mg Oral Daily Irean Hong, MD   100 mg at 10/24/20 1019   Or  . thiamine (B-1) injection 100 mg  100 mg Intravenous Daily Irean Hong, MD       Current Outpatient Medications  Medication Sig Dispense Refill  . elvitegravir-cobicistat-emtricitabine-tenofovir (GENVOYA) 150-150-200-10 MG TABS tablet Take 1 tablet by mouth daily with breakfast. 30 tablet 0  . FLUoxetine (PROZAC) 20 MG capsule Take 1 capsule (20 mg total) by mouth daily. (Patient not taking: Reported on 10/19/2020) 30 capsule 0  . nicotine (NICODERM CQ - DOSED IN MG/24 HOURS) 21 mg/24hr patch Place 1 patch (21 mg total) onto the skin daily. (Patient not taking: No sig reported) 28 patch 0  . pantoprazole (PROTONIX) 40 MG tablet Take 1 tablet (40 mg total) by mouth daily. (Patient not  taking: Reported on 10/19/2020) 30 tablet 0    Musculoskeletal: Strength & Muscle Tone: decreased Gait & Station: normal Patient leans: N/A  Psychiatric Specialty Exam: Physical Exam Vitals and nursing note reviewed.  Constitutional:      Appearance: He is well-developed and well-nourished.  HENT:     Head: Normocephalic and atraumatic.  Eyes:     Conjunctiva/sclera: Conjunctivae normal.     Pupils: Pupils are equal, round, and reactive to light.  Cardiovascular:     Heart sounds: Normal heart sounds.  Pulmonary:     Effort: Pulmonary effort is normal.  Abdominal:     Palpations: Abdomen is soft.  Musculoskeletal:        General: Normal range of motion.     Cervical back: Normal range of motion.  Skin:    General: Skin is warm and dry.  Neurological:     General: No focal deficit present.     Mental Status: He is alert.  Psychiatric:        Attention and Perception: Attention normal.        Mood and Affect: Mood  is anxious and depressed. Affect is blunt.        Speech: Speech is delayed.        Behavior: Behavior is slowed.        Thought Content: Thought content does not include homicidal or suicidal ideation.        Cognition and Memory: Cognition is impaired. Memory is impaired.        Judgment: Judgment is impulsive.     Review of Systems  Constitutional: Negative.   HENT: Negative.   Eyes: Negative.   Respiratory: Negative.   Cardiovascular: Negative.   Gastrointestinal: Positive for nausea.  Musculoskeletal: Negative.   Skin: Negative.   Neurological: Negative.   Psychiatric/Behavioral: Positive for confusion and dysphoric mood. The patient is nervous/anxious.     Blood pressure 118/76, pulse 68, temperature 98 F (36.7 C), temperature source Oral, resp. rate 16, height 6\' 1"  (1.854 m), weight 68 kg, SpO2 100 %.Body mass index is 19.79 kg/m.  General Appearance: Casual  Eye Contact:  Minimal  Speech:  Slow  Volume:  Decreased  Mood:  Depressed and  Dysphoric  Affect:  Constricted  Thought Process:  Coherent  Orientation:  Full (Time, Place, and Person)  Thought Content:  Tangential  Suicidal Thoughts:  No  Homicidal Thoughts:  No  Memory:  Immediate;   Fair Recent;   Poor Remote;   Fair  Judgement:  Impaired  Insight:  Shallow  Psychomotor Activity:  Decreased  Concentration:  Concentration: Poor  Recall:  of Knowledge:  Fair  Language:  Fair  Akathisia:  No  Handed:  Right  AIMS (if indicated):     Assets:  Desire for Improvement  ADL's:  Impaired  Cognition:  Impaired,  Mild  Sleep:        Treatment Plan Summary: Medication management and Plan Patient is dysphoric cognitively slowed homeless unable to care for himself and is continually relapsing into drinking.  Despite not being suicidal seems appropriate to try to get him into treatment.  He at this point is expressing agreement to go to rehab or other appropriate substance abuse treatment.  We have put in referrals for him.  He feels the ondansetron was only slightly helpful for the nausea so we are going to change to Phenergan to see if that helps better.  Does not need Ativan at this time but is still feeling sick and certainly needs to be eating better.  Disposition: Supportive therapy provided about ongoing stressors. Discussed crisis plan, support from social network, calling 911, coming to the Emergency Department, and calling Suicide Hotline.  [employer], MD 10/24/2020 12:29 PM

## 2020-10-24 NOTE — ED Notes (Signed)
Patient ate 75 % of supper and beverage.  

## 2020-10-24 NOTE — ED Notes (Signed)
Nurse gave Patient the phenergan 25mg  po for nausea, Patient is calm and cooperative, will continue to monitor. Patient states that He has a hard time eating because He is sick on His stomach all the time and that the zofran did not work for him, will check back and see if phenergan will make a difference.

## 2020-10-24 NOTE — ED Notes (Signed)
Patient ask for something for anxiety, Nurse let Dr. Toni Amend be aware and He said " He would assess the situation, Nurse will continue to monitor.

## 2020-10-24 NOTE — ED Provider Notes (Signed)
Emergency Medicine Observation Re-evaluation Note  Gary [name] is a 34 y.o. male, seen on rounds today.  Pt initially presented to the ED for complaints of Fall and Alcohol Intoxication Currently, the patient is resting.  Physical Exam  BP 118/76 (BP Location: Left Arm)   Pulse 68   Temp 98 F (36.7 C) (Oral)   Resp 16   Ht 6\' 1"  (1.854 m)   Wt 68 kg   SpO2 100%   BMI 19.79 kg/m  Physical Exam General: No acute distress Cardiac: Well-perfused extremities Lungs: No respiratory distress Psych: Appropriate mood and affect  ED Course / MDM  EKG:  Clinical Course as of 10/24/20 1030  Sun Oct 20, 2020  0227 Patient has been evaluated by psychiatric NP; will reassess in the morning once patient is more sober. [JS]    Clinical Course User Index [JS] Oct 22, 2020, MD   I have reviewed the labs performed to date as well as medications administered while in observation.  Recent changes in the last 24 hours include none.  Plan  Current plan is for psychiatric evaluation. Patient is not under full IVC at this time.   Irean Hong, MD 10/24/20 1031

## 2020-10-24 NOTE — ED Notes (Signed)
Unable to obtain vitals due to patient sleeping. Will continue to monitor.   

## 2020-10-24 NOTE — ED Notes (Signed)
VOL  PENDING  PLACEMENT 

## 2020-10-25 NOTE — ED Notes (Signed)
Hourly rounding reveals patient in room. No complaints, stable, in no acute distress. Q15 minute rounds and monitoring via [employer] to continue. 

## 2020-10-25 NOTE — ED Notes (Signed)
Unable to obtain vitals due to patient sleeping. Will continue to monitor.   

## 2020-10-25 NOTE — ED Notes (Signed)
Report to include Situation, Background, Assessment, and Recommendations received from Jeannette RN. Patient alert and oriented, warm and dry, in no acute distress. Patient denies SI, HI, AVH and pain. Patient made aware of Q15 minute rounds and security cameras for their safety. Patient instructed to come to me with needs or concerns.  

## 2020-10-25 NOTE — ED Notes (Signed)
In day room watching tv, snack provided.

## 2020-10-25 NOTE — BH Assessment (Signed)
Per [facility] ([phone]), the admission person wasn't present, she was at another facility. She states she will speak with the MD and see what they say and advised to call back.  Writer spoke with [facility] ([phone]), he states the doctor approved him for admission but will not have a date for him to arrived until Monday (09/28/2020).

## 2020-10-25 NOTE — BH Assessment (Signed)
Referral Check:   [facility] [phone], Staff reports to contact back around 8am and ask to speak with Amy

## 2020-10-25 NOTE — ED Provider Notes (Signed)
Emergency Medicine Observation Re-evaluation Note  Gary [name] is a 34 y.o. male, seen on rounds today.  Pt initially presented to the ED for complaints of Fall and Alcohol Intoxication Currently, the patient is resting.  Physical Exam  BP 106/67 (BP Location: Left Arm)   Pulse 74   Temp 98.8 F (37.1 C) (Oral)   Resp 18   Ht 6\' 1"  (1.854 m)   Wt 68 kg   SpO2 100%   BMI 19.79 kg/m  Physical Exam Constitutional:      Appearance: He is not ill-appearing or toxic-appearing.  HENT:     Head: Atraumatic.  Eyes:     Extraocular Movements: Extraocular movements intact.     Pupils: Pupils are equal, round, and reactive to light.  Pulmonary:     Effort: Pulmonary effort is normal.  Abdominal:     General: There is no distension.  Musculoskeletal:        General: No deformity.  Skin:    General: Skin is warm and dry.  Neurological:     General: No focal deficit present.     Cranial Nerves: No cranial nerve deficit.      ED Course / MDM  EKG:  Clinical Course as of 10/25/20 0729  10/27/20 Oct 20, 2020  0227 Patient has been evaluated by psychiatric NP; will reassess in the morning once patient is more sober. [JS]    Clinical Course User Index [JS] Oct 22, 2020, MD   I have reviewed the labs performed to date as well as medications administered while in observation.  Recent changes in the last 24 hours include none.  Plan  Current plan is for inpatient psychiatric care. Patient is not under full IVC at this time.   Irean Hong, MD 10/25/20 0730

## 2020-10-26 NOTE — ED Notes (Signed)
Hourly rounding reveals patient in room. No complaints, stable, in no acute distress. Q15 minute rounds and monitoring via [employer] to continue. 

## 2020-10-26 NOTE — ED Notes (Signed)
Pt given sandwich tray 

## 2020-10-26 NOTE — ED Notes (Signed)
Pt given dinner tray and sprite.  

## 2020-10-26 NOTE — ED Notes (Signed)
Pt received lunch tray. Pt is calm and comfortable at this time. 

## 2020-10-26 NOTE — ED Notes (Signed)
Pt in dayroom since after lunch watching movie with patient from room 2.

## 2020-10-26 NOTE — ED Notes (Signed)
Report to include Situation, Background, Assessment, and Recommendations received from Jeannette RN. Patient alert and oriented, warm and dry, in no acute distress. Patient denies SI, HI, AVH and pain. Patient made aware of Q15 minute rounds and security cameras for their safety. Patient instructed to come to me with needs or concerns.  

## 2020-10-26 NOTE — ED Notes (Signed)
Patient in day room watching a movie

## 2020-10-26 NOTE — ED Provider Notes (Signed)
Emergency Medicine Observation Re-evaluation Note  Gary [name] is a 34 y.o. male, seen on rounds today.  Pt initially presented to the ED for complaints of Fall and Alcohol Intoxication Currently, the patient is resting.  Physical Exam  BP 118/64 (BP Location: Right Arm)   Pulse 70   Temp 98.7 F (37.1 C) (Oral)   Resp 18   Ht 6\' 1"  (1.854 m)   Wt 68 kg   SpO2 98%   BMI 19.79 kg/m  Physical Exam Constitutional:      Appearance: He is not ill-appearing or toxic-appearing.  HENT:     Head: Atraumatic.  Eyes:     Extraocular Movements: Extraocular movements intact.     Pupils: Pupils are equal, round, and reactive to light.  Pulmonary:     Effort: Pulmonary effort is normal.  Abdominal:     General: There is no distension.  Musculoskeletal:        General: No deformity.  Skin:    General: Skin is warm and dry.  Neurological:     General: No focal deficit present.     Cranial Nerves: No cranial nerve deficit.      ED Course / MDM  EKG:  Clinical Course as of 10/26/20 10/28/20  1610 Oct 20, 2020  0227 Patient has been evaluated by psychiatric NP; will reassess in the morning once patient is more sober. [JS]    Clinical Course User Index [JS] Oct 22, 2020, MD   I have reviewed the labs performed to date as well as medications administered while in observation.  Recent changes in the last 24 hours include none.  Plan  Current plan is for inpatient psychiatric care. Patient is not under full IVC at this time.   Irean Hong, MD 10/25/20 0730    10/27/20, MD 10/26/20 [phone]

## 2020-10-27 MED ORDER — MELATONIN 5 MG PO TABS
5.0000 mg | ORAL_TABLET | Freq: Every day | ORAL | Status: DC
  Administered 2020-10-27 – 2020-11-04 (×10): 5 mg via ORAL
  Filled 2020-10-27 (×11): qty 1

## 2020-10-27 NOTE — ED Notes (Signed)
Pt given breakfast and juice

## 2020-10-27 NOTE — ED Notes (Signed)
Pt has had shower for the day.

## 2020-10-27 NOTE — ED Notes (Signed)
Pt given lunch tray.

## 2020-10-27 NOTE — ED Provider Notes (Signed)
Emergency Medicine Observation Re-evaluation Note  Gary [name] is a 34 y.o. male, seen on rounds today.  Pt initially presented to the ED for complaints of Fall and Alcohol Intoxication Currently, the patient is calm, resting.  Physical Exam  BP 120/70 (BP Location: Left Arm)   Pulse 71   Temp 98 F (36.7 C) (Oral)   Resp 16   Ht 6\' 1"  (1.854 m)   Wt 68 kg   SpO2 100%   BMI 19.79 kg/m  Physical Exam General: NAD Cardiac: Well perfused Lungs: No acute distress, normal WOB Psych: Calm, resting  ED Course / MDM  EKG:  Clinical Course as of 10/27/20 0817  Sun Oct 20, 2020  0227 Patient has been evaluated by psychiatric NP; will reassess in the morning once patient is more sober. [JS]    Clinical Course User Index [JS] Oct 22, 2020, MD   I have reviewed the labs performed to date as well as medications administered while in observation.  Recent changes in the last 24 hours include none, seeking placement.  Plan  Current plan is for Psych/rehab placement. Patient is not under full IVC at this time.   Irean Hong, MD 10/27/20 [phone]

## 2020-10-27 NOTE — ED Notes (Signed)
Hourly rounding reveals patient in room. No complaints, stable, in no acute distress. Q15 minute rounds and monitoring via [employer] to continue. 

## 2020-10-27 NOTE — ED Notes (Signed)
Pt given dinner tray.

## 2020-10-27 NOTE — ED Notes (Signed)
Patient is IVC pending placement 

## 2020-10-27 NOTE — ED Notes (Signed)
Pt given snack. 

## 2020-10-27 NOTE — ED Notes (Signed)
VS not taken, patient asleep 

## 2020-10-28 NOTE — ED Notes (Signed)
VOL  PENDING  PLACEMENT 

## 2020-10-28 NOTE — ED Provider Notes (Signed)
Emergency Medicine Observation Re-evaluation Note  Gary [name] is a 34 y.o. male, seen on rounds today.  Pt initially presented to the ED for complaints of Fall and Alcohol Intoxication Currently, the patient is walking in the hallway, denies complaints.  Physical Exam  BP 104/75 (BP Location: Left Arm)   Pulse 65   Temp (!) 97.5 F (36.4 C) (Oral)   Resp 18   Ht 6\' 1"  (1.854 m)   Wt 68 kg   SpO2 99%   BMI 19.79 kg/m  Physical Exam Constitutional: Resting comfortably. Eyes: Conjunctivae are normal. Head: Atraumatic. Nose: No congestion/rhinnorhea. Mouth/Throat: Mucous membranes are moist. Neck: Normal ROM Cardiovascular: No cyanosis noted. Respiratory: Normal respiratory effort. Gastrointestinal: Non-distended. Genitourinary: deferred Musculoskeletal: No lower extremity tenderness nor edema. Neurologic:  Normal speech and language. No gross focal neurologic deficits are appreciated. Skin:  Skin is warm, dry and intact. No rash noted.    ED Course / MDM  EKG:  Clinical Course as of 10/28/20 0919  10/30/20 Oct 20, 2020  0227 Patient has been evaluated by psychiatric NP; will reassess in the morning once patient is more sober. [JS]    Clinical Course User Index [JS] Oct 22, 2020, MD   I have reviewed the labs performed to date as well as medications administered while in observation.  No changes in the last 24 hours.  Plan  Current plan is for psychiatric admission vs rehab placement. Patient is not under full IVC at this time.   Irean Hong, MD 10/28/20 [phone]

## 2020-10-28 NOTE — ED Notes (Signed)
Pt in room sleeping.

## 2020-10-28 NOTE — ED Notes (Signed)
VS will betaken when patient wakes/ breakfast arrives. 

## 2020-10-28 NOTE — ED Notes (Signed)
Resumed care from Nichole Neyer rn.  Pt alert and cooperative.  Pt in day room watching tv

## 2020-10-28 NOTE — BH Assessment (Signed)
Writer called [facility] [phone]) [phone] and spoke with [employer]. She states she is waiting to hear from her Supervisor but will not have a date prior toThursday 10/31/20.

## 2020-10-28 NOTE — ED Notes (Signed)
Lights off and tv off in dayroom  Pt aware

## 2020-10-28 NOTE — ED Notes (Signed)
Meal tray given 

## 2020-10-29 NOTE — ED Notes (Signed)
Report received from Amy RN. Patient care assumed. Patient/RN introduction complete. Will continue to monitor.  

## 2020-10-29 NOTE — ED Notes (Signed)
Hourly rounding completed at this time, patient currently watching tv with other pt in dayroom. No complaints, stable, and in no acute distress. Q15 minute rounds and monitoring via [employer] to continue.

## 2020-10-29 NOTE — ED Provider Notes (Signed)
Emergency Medicine Observation Re-evaluation Note  Gary [name] is a 34 y.o. male, seen on rounds today.  Pt initially presented to the ED for complaints of Fall and Alcohol Intoxication Currently, the patient is resting comfortably.  Physical Exam  BP 128/82 (BP Location: Right Arm)   Pulse 83   Temp 98 F (36.7 C) (Oral)   Resp 17   Ht 6\' 1"  (1.854 m)   Wt 68 kg   SpO2 98%   BMI 19.79 kg/m  Physical Exam General: No acute distress Cardiac: Well-perfused extremities Lungs: No respiratory distress Psych: Appropriate mood and affect  ED Course / MDM  EKG:  Clinical Course as of 10/29/20 0836  Sun Oct 20, 2020  0227 Patient has been evaluated by psychiatric NP; will reassess in the morning once patient is more sober. [JS]    Clinical Course User Index [JS] Oct 22, 2020, MD   I have reviewed the labs performed to date as well as medications administered while in observation.  Recent changes in the last 24 hours include none.  Plan  Current plan is for psychiatric versus rehab placement. Patient is not under full IVC at this time.   Irean Hong, MD 10/29/20 [phone]

## 2020-10-29 NOTE — ED Notes (Signed)
Pt up to bathroom.

## 2020-10-29 NOTE — ED Notes (Signed)
Pt has been watching TV in dayroom the majority of the shift.  Calm and cooperative.

## 2020-10-29 NOTE — ED Notes (Signed)
Hourly rounding completed at this time, patient currently watching tv in dayroom. No complaints, stable, and in no acute distress. Q15 minute rounds and monitoring via Security Cameras to continue. 

## 2020-10-29 NOTE — ED Notes (Signed)
Patient provided with snack and beverage at this time.  ?

## 2020-10-29 NOTE — ED Notes (Signed)
Hourly rounding completed at this time, patient currently awake in room. No complaints, stable, and in no acute distress. Q15 minute rounds and monitoring via Security Cameras to continue. 

## 2020-10-29 NOTE — ED Notes (Signed)
Patient resting comfortably in room. No complaints or concerns voiced. No distress or abnormal behavior noted. Will continue to monitor with security cameras. Q 15 minute rounds continue. 

## 2020-10-29 NOTE — ED Notes (Signed)
VOL  PENDING  PLACEMENT 

## 2020-10-29 NOTE — ED Notes (Signed)
Pt sleeping in bed

## 2020-10-29 NOTE — ED Notes (Addendum)
Report received from Amy, RN including  Situation, Background, Assessment, and Recommendations. Patient alert and oriented, warm and dry, in no acute distress. Patient denies SI, HI, AVH and pain. Patient made aware of Q15 minute rounds and security cameras for their safety. Patient instructed to come to this nurse with needs or concerns. 

## 2020-10-29 NOTE — ED Notes (Signed)
Pt sleeping. VS will be taken once awake. 

## 2020-10-30 LAB — RESP PANEL BY RT-PCR (FLU A&B, COVID) ARPGX2
Influenza A by PCR: NEGATIVE
Influenza B by PCR: NEGATIVE
SARS Coronavirus 2 by RT PCR: POSITIVE — AB

## 2020-10-30 MED ORDER — ALBUTEROL SULFATE HFA 108 (90 BASE) MCG/ACT IN AERS
2.0000 | INHALATION_SPRAY | Freq: Once | RESPIRATORY_TRACT | Status: DC | PRN
  Filled 2020-10-30: qty 6.7

## 2020-10-30 MED ORDER — PANTOPRAZOLE SODIUM 40 MG PO TBEC
40.0000 mg | DELAYED_RELEASE_TABLET | Freq: Every day | ORAL | Status: DC
  Administered 2020-10-31 – 2020-11-09 (×9): 40 mg via ORAL
  Filled 2020-10-30 (×9): qty 1

## 2020-10-30 MED ORDER — FAMOTIDINE IN NACL 20-0.9 MG/50ML-% IV SOLN
20.0000 mg | Freq: Once | INTRAVENOUS | Status: DC | PRN
  Filled 2020-10-30: qty 50

## 2020-10-30 MED ORDER — DIPHENHYDRAMINE HCL 50 MG/ML IJ SOLN: 50.0000 mg | Freq: Once | INTRAMUSCULAR | Status: DC | PRN

## 2020-10-30 MED ORDER — METHYLPREDNISOLONE SODIUM SUCC 125 MG IJ SOLR
125.0000 mg | Freq: Once | INTRAMUSCULAR | Status: DC | PRN
  Filled 2020-10-30: qty 2

## 2020-10-30 MED ORDER — SODIUM CHLORIDE 0.9 % IV SOLN
Freq: Once | INTRAVENOUS | Status: AC
  Filled 2020-10-30: qty 20

## 2020-10-30 MED ORDER — ELVITEG-COBIC-EMTRICIT-TENOFAF 150-150-200-10 MG PO TABS
1.0000 | ORAL_TABLET | Freq: Every day | ORAL | Status: DC
  Administered 2020-10-31 – 2020-11-09 (×10): 1 via ORAL
  Filled 2020-10-30 (×15): qty 1

## 2020-10-30 MED ORDER — SODIUM CHLORIDE 0.9 % IV SOLN: 1200.0000 mg | Freq: Once | INTRAVENOUS | Status: DC

## 2020-10-30 MED ORDER — EPINEPHRINE 0.3 MG/0.3ML IJ SOAJ
0.3000 mg | Freq: Once | INTRAMUSCULAR | Status: DC | PRN
  Filled 2020-10-30: qty 0.3

## 2020-10-30 MED ORDER — SODIUM CHLORIDE 0.9 % IV SOLN: INTRAVENOUS | Status: DC | PRN

## 2020-10-30 MED ORDER — IBUPROFEN 600 MG PO TABS
600.0000 mg | ORAL_TABLET | Freq: Once | ORAL | Status: AC
  Administered 2020-10-30: 600 mg via ORAL
  Filled 2020-10-30: qty 1

## 2020-10-30 NOTE — ED Notes (Signed)
Hourly rounding completed at this time, patient currently awake in room. No complaints, stable, and in no acute distress. Q15 minute rounds and monitoring via Rover and Officer to continue. °

## 2020-10-30 NOTE — ED Notes (Signed)
Hourly rounding completed at this time, patient currently asleep in room. No complaints, stable, and in no acute distress. Q15 minute rounds and monitoring via Security Cameras to continue. 

## 2020-10-30 NOTE — ED Provider Notes (Signed)
Procedures  Clinical Course as of 10/30/20 1707  Sun Oct 20, 2020  0227 Patient has been evaluated by psychiatric NP; will reassess in the morning once patient is more sober. [JS]    Clinical Course User Index [JS] Irean Hong, MD    ----------------------------------------- 5:07 PM on 10/30/2020 ----------------------------------------- Patient's Covid test is positive.  He has had body aches for the past 2 days.  He has a history of HIV, last filled his Genvoya in September 2021, currently off his medication.  He meets criteria for Regeneron antibody infusion.  We will give this in the ED.  I will restart his Genvoya.    Sharman Cheek, MD 10/30/20 [phone]

## 2020-10-30 NOTE — ED Notes (Signed)
Patient given food tray with sprite. 

## 2020-10-30 NOTE — BH Assessment (Signed)
Spoke with [facility] ([phone]), patient pending bed. They are currently full and no discharges at this time. Advised to call back tomorrow (10/31/2020).

## 2020-10-30 NOTE — ED Notes (Signed)
Gave patient phone to use. 

## 2020-10-30 NOTE — ED Notes (Signed)
Garage door in room closed to ensure safety at this time after monitoring is completed.

## 2020-10-30 NOTE — ED Provider Notes (Signed)
Emergency Medicine Observation Re-evaluation Note  Gary [name] is a 34 y.o. male, seen on rounds today.  Pt initially presented to the ED for complaints of Fall and Alcohol Intoxication Currently, the patient is calm, no acute distress.  Physical Exam  BP 111/81   Pulse 76   Temp 98.9 F (37.2 C) (Oral)   Resp 18   Ht 6\' 1"  (1.854 m)   Wt 68 kg   SpO2 100%   BMI 19.79 kg/m  Physical Exam General: calm, cooperative Cardiac: RRR Lungs: equal chest rise, no distress noted  Psych: calm   ED Course / MDM  EKG:  Clinical Course as of 10/30/20 2358  Sun Oct 20, 2020  0227 Patient has been evaluated by psychiatric NP; will reassess in the morning once patient is more sober. [JS]    Clinical Course User Index [JS] Oct 22, 2020, MD   I have reviewed the labs performed to date as well as medications administered while in observation.  Recent changes in the last 24 hours include: Tested positive for COVID, received antibody infusion.  Plan  Current plan is for awaiting OSH placement for detox. Patient is not under full IVC at this time.   Irean Hong, MD 10/31/20 [phone]

## 2020-10-30 NOTE — ED Notes (Signed)
Pt asleep at this time, unable to collect vitals. Will collect pt vitals once awake. 

## 2020-10-30 NOTE — ED Notes (Signed)
Garage door opened in room to access monitor.

## 2020-10-30 NOTE — ED Provider Notes (Signed)
Emergency Medicine Observation Re-evaluation Note  TAHJ NJOKU is a 34 y.o. male, seen on rounds today.  Pt initially presented to the ED for complaints of Fall and Alcohol Intoxication Currently, the patient is sitting comfortably in the common area of the BHU.  He states he is having mild cold type symptoms with some throat discomfort and stuffy nose.    Physical Exam  BP 119/81 (BP Location: Left Arm)   Pulse 94   Temp 99.9 F (37.7 C) (Oral)   Resp 19   Ht 6\' 1"  (1.854 m)   Wt 68 kg   SpO2 100%   BMI 19.79 kg/m  Physical Exam General: Alert and oriented. Cardiac: Good peripheral perfusion. Lungs: Normal respiratory effort. Psych: Calm and cooperative.  ED Course / MDM   I have reviewed the labs performed to date as well as medications administered while in observation.  There have been no recent changes to his status in the last 24 hours other than he is subjectively reporting URI type symptoms.  He is currently afebrile.  We will repeat a Covid test but he does not need any further work-up at this time.  We will manage the symptoms expectantly.  The patient is in agreement with this.  Plan  Current plan is for disposition per psychiatry/social work team.   Patient is not under full IVC at this time.   , MD 10/30/20 [phone]

## 2020-10-30 NOTE — ED Notes (Signed)
Patient repeat covid positive, md aware patient moved to room 6, house keeping called for covid clean.

## 2020-10-30 NOTE — ED Notes (Signed)
Meal tray given 

## 2020-10-30 NOTE — ED Notes (Signed)
Pt provided with sandwich tray and apple juice per request

## 2020-10-30 NOTE — BH Assessment (Signed)
Writer called [facility] [phone]) [phone] to get an update on referral and spoke with Ethelene Browns who reports referral is on [employer] in her office, Ethelene Browns reports to contact back after 8am and ask to speak with the Supervisor Amy to get an update. TTS to follow-up

## 2020-10-30 NOTE — ED Notes (Signed)
Repeat covid swab obtained and sent.

## 2020-10-30 NOTE — ED Notes (Signed)
Report received from Jessica, RN including Situation, Background, Assessment, and Recommendations. Patient alert and oriented, warm and dry, and in no acute distress. Patient denies SI, HI, AVH and pain. Patient made aware of Q15 minute rounds and Rover and Officer presence for their safety. Patient instructed to come to this nurse with needs or concerns. 

## 2020-10-30 NOTE — ED Notes (Signed)
Pt has signed and agree with information read on fact sheet and discussed with nurse.

## 2020-10-30 NOTE — ED Notes (Signed)
Pt requests to wait on melatonin at this time, states he would like to take later so he can finish watching TV.

## 2020-10-31 MED ORDER — MELATONIN 5 MG PO TABS
5.0000 mg | ORAL_TABLET | Freq: Every day | ORAL | Status: DC
  Administered 2020-10-31 – 2020-11-08 (×8): 5 mg via ORAL
  Filled 2020-10-31 (×7): qty 1

## 2020-10-31 NOTE — ED Notes (Signed)
Hourly rounding completed at this time, patient currently asleep in room. No complaints, stable, and in no acute distress. Q15 minute rounds and monitoring via Rover and Officer to continue. 

## 2020-10-31 NOTE — ED Notes (Signed)
Per conversation with management - pt given bedside toilet to use instead of public toilet.

## 2020-10-31 NOTE — ED Notes (Signed)
Hourly rounding completed at this time, patient currently awake in room. No complaints, stable, and in no acute distress. Q15 minute rounds and monitoring via Rover and Officer to continue. °

## 2020-10-31 NOTE — ED Notes (Signed)
Pt given dinner tray.

## 2020-10-31 NOTE — ED Notes (Signed)
Pt given breakfast meal tray. Pt eating at this time. VS updated. 

## 2020-10-31 NOTE — ED Notes (Signed)
Pt given lunch tray.

## 2020-10-31 NOTE — ED Notes (Signed)
Pt asleep at this time, unable to collect vitals. Will collect pt vitals once awake. 

## 2020-10-31 NOTE — ED Notes (Signed)
Patient remains awake at this time. Requesting stronger dose of sleep medicine and states "I haven't been able to sleep really since I've been here". MD Derrill Kay notified. See new orders and eMAR.

## 2020-11-01 MED ORDER — LORAZEPAM 1 MG PO TABS
1.0000 mg | ORAL_TABLET | Freq: Once | ORAL | Status: AC
  Administered 2020-11-01: 1 mg via ORAL
  Filled 2020-11-01: qty 1

## 2020-11-01 NOTE — ED Notes (Signed)
Pt reporting he was still unable to sleep last night with 10 mg of melatonin. Pt requesting medications to sleep and decrease anxiety.

## 2020-11-01 NOTE — ED Provider Notes (Signed)
Emergency Medicine Observation Re-evaluation Note  Gary [name] is a 34 y.o. male, seen on rounds today.  Pt initially presented to the ED for complaints of Fall and Alcohol Intoxication Currently, the patient is calm and voices no medical complaints.  Physical Exam  BP 105/69 (BP Location: Right Arm)   Pulse 76   Temp 98.9 F (37.2 C) (Oral)   Resp 18   Ht 6\' 1"  (1.854 m)   Wt 68 kg   SpO2 100%   BMI 19.79 kg/m  Physical Exam General: Resting in no acute distress Cardiac: No cyanosis Lungs: Equal rise and fall Psych: Not agitated  ED Course / MDM  EKG:  Clinical Course as of 11/01/20 11/03/20  3428 Oct 20, 2020  0227 Patient has been evaluated by psychiatric NP; will reassess in the morning once patient is more sober. [JS]    Clinical Course User Index [JS] Oct 22, 2020, MD   I have reviewed the labs performed to date as well as medications administered while in observation.  Recent changes in the last 24 hours include no events overnight.  Plan  Current plan is for psychiatric disposition, TTS continuing to try to find beds for the patient. Patient is not under full IVC at this time.   Irean Hong, MD 11/01/20 [phone]

## 2020-11-01 NOTE — ED Notes (Signed)
Received missing med from pharmacy and administered to patient.

## 2020-11-01 NOTE — ED Notes (Signed)
Assumed care of patient. Patient reports feeling well, shares with Clinical research associate he is just a little upselt that he is in quarantine due to covid, otherwise feeling ok, denied any SI/HV/SI. Will continue to monitor. VSS.

## 2020-11-02 MED ORDER — HYDROXYZINE HCL 25 MG PO TABS
50.0000 mg | ORAL_TABLET | Freq: Once | ORAL | Status: AC
  Administered 2020-11-02: 50 mg via ORAL
  Filled 2020-11-02: qty 2

## 2020-11-02 MED ORDER — DOCUSATE SODIUM 100 MG PO CAPS: 100.0000 mg | ORAL_CAPSULE | Freq: Two times a day (BID) | ORAL | Status: DC | PRN

## 2020-11-02 NOTE — ED Notes (Signed)
Pt sitting up eating lunch; states he has some anxiety and body aches, but no real distress. Pt alert & oriented.

## 2020-11-02 NOTE — ED Notes (Signed)
Will reassesses vitals when pt is awake.

## 2020-11-02 NOTE — ED Notes (Signed)
Pt still has not had a BM. Pt continues to pass gas but is agreeable to stool softener. MD made aware.

## 2020-11-02 NOTE — ED Notes (Signed)
Resumed care from Shannon, RN. Pt sleeping; will continue to monitor. 

## 2020-11-02 NOTE — ED Notes (Signed)
Pt given breakfast tray

## 2020-11-02 NOTE — ED Notes (Signed)
Pt ate approximately 1/2 of his breakfast; urinal emptied of 900 cc urine.

## 2020-11-02 NOTE — ED Provider Notes (Signed)
Emergency Medicine Observation Re-evaluation Note  Gary [name] is a 34 y.o. male, seen on rounds today.  Pt initially presented to the ED for complaints of Fall and Alcohol Intoxication Currently, the patient is calm, voices no medical complaint.  Physical Exam  BP 107/62 (BP Location: Right Arm)   Pulse 78   Temp 99 F (37.2 C) (Oral)   Resp 17   Ht 6\' 1"  (1.854 m)   Wt 68 kg   SpO2 98%   BMI 19.79 kg/m  Physical Exam General: Resting in no acute distress Cardiac: No cyanosis Lungs: Equal rise and fall Psych: Not agitated  ED Course / MDM  EKG:  Clinical Course as of 11/02/20 11/04/20  6440 Oct 20, 2020  0227 Patient has been evaluated by psychiatric NP; will reassess in the morning once patient is more sober. [Gary [name]]    Clinical Course User Index [Gary [name]] Oct 22, 2020, MD   I have reviewed the labs performed to date as well as medications administered while in observation.  Recent changes in the last 24 hours include no events overnight.  Plan  Current plan is for TTS continues to refer patient out to detox facility; this is complicated by patient's Covid+ status. Patient is not under full IVC at this time.   Irean Hong, MD 11/02/20 [phone]

## 2020-11-02 NOTE — ED Notes (Signed)
Gave pt food tray with juice. 

## 2020-11-03 NOTE — ED Notes (Signed)
Hourly rounding completed at this time, patient currently asleep in room. No complaints, stable, and in no acute distress. Q15 minute rounds and monitoring via Rover and Officer to continue. 

## 2020-11-03 NOTE — ED Notes (Signed)
Patient is vol pending placement 

## 2020-11-03 NOTE — ED Notes (Signed)
Pt resting at this time, will obtain vitals when patient awakes. Pt respirations even and unlabored at this time. Pt appears to be in NAD. 

## 2020-11-03 NOTE — ED Notes (Signed)
Hourly rounding completed at this time, patient currently awake in room. No complaints, stable, and in no acute distress. Q15 minute rounds and monitoring via Rover and Officer to continue. °

## 2020-11-03 NOTE — ED Notes (Signed)
Pt complains of anxiety at this time, states this is due to being in same place for so long and due to movie he is watching. Requests medication for anxiety at this time. Dr. Vicente Males notified.

## 2020-11-03 NOTE — ED Provider Notes (Signed)
Emergency Medicine Observation Re-evaluation Note  DEMBA NIGH is a 34 y.o. male, seen on rounds today.  Pt initially presented to the ED for complaints of Fall and Alcohol Intoxication Currently, the patient is resting.  Physical Exam  BP 113/72   Pulse 73   Temp 98.7 F (37.1 C) (Oral)   Resp 18   Ht 1.854 m (6\' 1" )   Wt 68 kg   SpO2 100%   BMI 19.79 kg/m  Physical Exam Gen:  No acute distress Resp:  Breathing easily and comfortably, no accessory muscle usage Neuro:  Moving all four extremities, no gross focal neuro deficits Psych:  Resting currently, calm and cooperative when awake  ED Course / MDM  EKG:   I have reviewed the labs performed to date as well as medications administered while in observation.  Recent changes in the last 24 hours include no significant changes.  The plan is for detox placement, but this will be delayed due to his COVID-19 status.  Plan  Current plan is for TTS placement for detox. Patient is not under full IVC at this time.   , MD 11/03/20 [phone]

## 2020-11-03 NOTE — ED Notes (Signed)
Report received from [facility], RN including Situation, Background, Assessment, and Recommendations. Patient alert and oriented, warm and dry, and in no acute distress. Patient denies SI, HI, AVH and pain. Patient made aware of Q15 minute rounds and Psychologist, counselling presence for their safety. Patient instructed to come to this nurse with needs or concerns.

## 2020-11-03 NOTE — ED Notes (Signed)
Pt asleep at this time, unable to collect vitals. Will collect pt vitals once awake. 

## 2020-11-03 NOTE — ED Notes (Signed)
Pt asleep, breakfast left at bedside. 

## 2020-11-03 NOTE — ED Notes (Signed)
Pt states anxiety has improved but states he is fearful he will have trouble sleeping tonight.

## 2020-11-04 NOTE — ED Notes (Signed)
Pt given breakfast tray

## 2020-11-04 NOTE — ED Provider Notes (Signed)
Emergency Medicine Observation Re-evaluation Note  Gary [name] is a 34 y.o. male, seen on rounds today.  Pt initially presented to the ED for complaints of Fall and Alcohol Intoxication   Physical Exam  BP (!) 107/50   Pulse 64   Temp 98.5 F (36.9 C) (Oral)   Resp 18   Ht 6\' 1"  (1.854 m)   Wt 68 kg   SpO2 97%   BMI 19.79 kg/m  Physical Exam General: Patient sleeping comfortably in bed Lungs: Patient not in respiratory distress Psych: Patient not agitated  ED Course / MDM  EKG:  Clinical Course as of 11/04/20 0104  Sun Oct 20, 2020  0227 Patient has been evaluated by psychiatric NP; will reassess in the morning once patient is more sober. [JS]    Clinical Course User Index [JS] Oct 22, 2020, MD     Plan  Patient is voluntary.  TTS is trying to place him for detox but he is Covid positive which is making this difficult.   Irean Hong, MD 11/04/20 [phone]

## 2020-11-04 NOTE — ED Notes (Signed)
Resumed care from annie rn.  Pt alert watching tv.

## 2020-11-04 NOTE — ED Notes (Signed)
Pt given lunch tray.

## 2020-11-04 NOTE — ED Notes (Signed)
Pt watching tv

## 2020-11-04 NOTE — ED Notes (Signed)
VOL  PENDING  PLACEMENT 

## 2020-11-05 NOTE — ED Notes (Signed)
Hourly rounding reveals patient in room. No complaints, stable, in no acute distress. Q15 minute rounds and monitoring via Rover and Officer to continue.   

## 2020-11-05 NOTE — ED Notes (Signed)
Report to include Situation, Background, Assessment, and Recommendations received from [facility]. Patient alert and oriented, warm and dry, in no acute distress. Patient denies SI, HI, AVH and pain. Patient made aware of Q15 minute rounds and Psychologist, counselling presence for their safety. Patient instructed to come to me with needs or concerns.

## 2020-11-05 NOTE — ED Notes (Signed)
Lunch meal tray given.  

## 2020-11-05 NOTE — ED Notes (Signed)
Pt sleeping. 

## 2020-11-05 NOTE — ED Notes (Signed)
Breakfast meal tray given 

## 2020-11-05 NOTE — BH Assessment (Addendum)
PATIENT IS SCHEDULED FOR ADMISSION ON 11/09/20 at 10am  Patient has been accepted to [facility] [place]) Patient assigned to: Main Building Accepting physician is Dr. Uvaldo Rising  Call report to [phone] Representative was Amy    ER Staff is aware of it:  Nitchia, ER Secretary  Lenard Lance, ER MD  Morrie Sheldon, Patient's Nurse     Pt refuses to provide any family/support to be updated at this time

## 2020-11-05 NOTE — BH Assessment (Addendum)
TTS made contact with [place] ([facility]) who reports pt to remain under review pending bed assignment and to be unable to hold beds for any reason. [place] transferred TTS to her supervisor (Amy) voicemail where TTS left a detailed message.   TTS is currently awaiting a call back from Amy to confirm pt's bed assignment and the possibility of holding the bed for pt due COVID  quarentine.   Per notes pt tested positive on 10/10/20 and should be cleared on 11/09/20.

## 2020-11-05 NOTE — BH Assessment (Addendum)
TTS spoke with [place] ([facility]-[phone]) who reports the return of all providers today and plans for TTS to be contacted later after all discharges and bed assignments are determined.

## 2020-11-05 NOTE — ED Provider Notes (Signed)
Emergency Medicine Observation Re-evaluation Note  Gary [name] is a 34 y.o. male, seen on rounds today.  Pt initially presented to the ED for complaints of Fall and Alcohol Intoxication Currently, the patient is resting.  Physical Exam  BP 102/65 (BP Location: Right Arm)   Pulse 66   Temp (!) 97.5 F (36.4 C) (Axillary)   Resp 20   Ht 1.854 m (6\' 1" )   Wt 68 kg   SpO2 100%   BMI 19.79 kg/m  Physical Exam Gen:  No acute distress Resp:  Breathing easily and comfortably, no accessory muscle usage Neuro:  Moving all four extremities, no gross focal neuro deficits Psych:  Resting currently, calm and cooperative when awake  ED Course / MDM  EKG:   I have reviewed the labs performed to date as well as medications administered while in observation.  Recent changes in the last 24 hours include no significant changes.  Plan  Current plan is for placement. Patient is not under full IVC at this time.   , MD 11/05/20 [phone]

## 2020-11-06 NOTE — ED Notes (Signed)
Report to include Situation, Background, Assessment, and Recommendations received from Katie RN. Patient alert and oriented, warm and dry, in no acute distress. Patient denies SI, HI, AVH and pain. Patient made aware of Q15 minute rounds and Rover and Officer presence for their safety. Patient instructed to come to me with needs or concerns.   

## 2020-11-06 NOTE — ED Notes (Signed)
Hourly rounding reveals patient in room. No complaints, stable, in no acute distress. Q15 minute rounds and monitoring via Rover and Officer to continue.   

## 2020-11-06 NOTE — ED Notes (Signed)
VS not taken, patient is asleep. 

## 2020-11-06 NOTE — BH Assessment (Addendum)
TTS received call from [place] ([facility]) stating patient needs to receive COVID test on Friday 11/08/20 prior to his admission on 11/09/20. Results need to either be sent with patient or can be called in before patient leaves.  TTS updated Dr. Roselind Messier.

## 2020-11-06 NOTE — ED Provider Notes (Signed)
Patient tested Covid positive on 12/22.  He is asymptomatic.  Per updated CDC guidelines he has been isolated for 5 days.  He will wear a mask for the next 5 days.  While wearing a mask he does not have to remain in isolation as long as he is asymptomatic.   Gilles Chiquito, MD 11/06/20 1945

## 2020-11-07 NOTE — ED Notes (Signed)
Report received from Wendy, RN including  Situation, Background, Assessment, and Recommendations. Patient alert and oriented, warm and dry, in no acute distress. Patient denies SI, HI, AVH and pain. Patient made aware of Q15 minute rounds and security cameras for their safety. Patient instructed to come to this nurse with needs or concerns. 

## 2020-11-07 NOTE — ED Notes (Signed)
Hourly rounding completed at this time, patient currently awake in room. No complaints, stable, and in no acute distress. Q15 minute rounds and monitoring via Security Cameras to continue. 

## 2020-11-07 NOTE — ED Notes (Signed)
Patient is taking a shower.

## 2020-11-07 NOTE — ED Notes (Signed)
Pt calm, cooperative, resting in room upon arrival. Pt denies SI, HI, AH/VH. Pt denies further needs.

## 2020-11-07 NOTE — ED Notes (Signed)
Unable to obtain vitals due to patient sleeping. Will continue to monitor.   

## 2020-11-07 NOTE — ED Notes (Signed)
Patient in bathroom

## 2020-11-07 NOTE — ED Notes (Signed)
Hourly rounding completed at this time, patient currently eating in room. No complaints, stable, and in no acute distress. Q15 minute rounds and monitoring via [employer] to continue. °

## 2020-11-07 NOTE — ED Notes (Signed)
Pt given dinner tray at this time.  

## 2020-11-07 NOTE — ED Notes (Signed)
Hourly rounding completed at this time, patient currently asleep in room. No complaints, stable, and in no acute distress. Q15 minute rounds and monitoring via Security Cameras to continue. 

## 2020-11-07 NOTE — ED Notes (Signed)
Patient is pleasant, no complaints , states " that He is still focused on staying sober' also Patient wants to take shower, Nurse will get His hygiene items for him.

## 2020-11-07 NOTE — ED Notes (Signed)
Patient provided with snack and beverage at this time.  ?

## 2020-11-08 LAB — POC SARS CORONAVIRUS 2 AG -  ED: SARS Coronavirus 2 Ag: NEGATIVE

## 2020-11-08 NOTE — ED Notes (Signed)
Hourly rounding reveals patient in room. No complaints, stable, in no acute distress. Q15 minute rounds and monitoring via [employer] to continue. 

## 2020-11-08 NOTE — ED Notes (Signed)
No v-signs patient sleeping at this time 

## 2020-11-08 NOTE — ED Notes (Signed)
Patient lying in bed and appears to be asleep, NAD observed and will continue to monitor 

## 2020-11-08 NOTE — ED Notes (Signed)
Patient in dayroom with another patient watching television. NAD noted Patient appropriate and cooperative

## 2020-11-08 NOTE — ED Notes (Signed)
Hourly rounding completed at this time, patient currently awale in room, watching TV. No complaints, stable, and in no acute distress. Q15 minute rounds and monitoring via [employer] to continue.

## 2020-11-08 NOTE — BH Assessment (Addendum)
Clinician was provided an update from his nurse, Soledad Gerlach RN, re: pt's re-administered COVID test. Pt's nurse shared pt's test has returned NEGATIVE. Clinician contacted [facility] [phone]) and provided them this update. Clinician faxed COVID results to [phone] and to [phone]; the fax was successfully faxed at [phone]. Pt has been accepted to [facility] for 1000 on 11/09/2020.

## 2020-11-08 NOTE — ED Notes (Signed)
Hourly rounding completed at this time, patient currently asleep in room. No complaints, stable, and in no acute distress. Q15 minute rounds and monitoring via Security Cameras to continue. 

## 2020-11-08 NOTE — ED Notes (Signed)
Hourly rounding completed at this time, patient currently awake in room. No complaints, stable, and in no acute distress. Q15 minute rounds and monitoring via Security Cameras to continue. 

## 2020-11-08 NOTE — ED Provider Notes (Signed)
Emergency Medicine Observation Re-evaluation Note  Gary [name] is a 34 y.o. male, seen on rounds today.  Pt initially presented to the ED for complaints of Fall and Alcohol Intoxication Currently, the patient is alert and comfortable.  He was watching the TV in the common room and denied any acute complaints.  Physical Exam  BP 111/75 (BP Location: Right Arm)   Pulse 69   Temp 98.2 F (36.8 C) (Oral)   Resp 17   Ht 6\' 1"  (1.854 m)   Wt 68 kg   SpO2 100%   BMI 19.79 kg/m  Physical Exam General: Alert and oriented.  Comfortable appearing. Cardiac: Normal peripheral perfusion. Lungs: Normal respiratory effort. Psych: Calm and cooperative.  Appropriate behavior.  ED Course / MDM  EKG:   I have reviewed the labs performed to date as well as medications administered while in observation.  There have been no recent changes to the patient's status in the last 24 hours.  He had a repeat Covid test today which was negative.  Plan  Current plan is for disposition to [facility], likely tomorrow. Patient is not under full IVC at this time.   , MD 11/08/20 [phone]

## 2020-11-08 NOTE — ED Notes (Signed)
Report to include Situation, Background, Assessment, and Recommendations received from Jadeka RN. Patient alert and oriented, warm and dry, in no acute distress. Patient denies SI, HI, AVH and pain. Patient made aware of Q15 minute rounds and security cameras for their safety. Patient instructed to come to me with needs or concerns. 

## 2020-11-08 NOTE — ED Notes (Signed)
Pt asleep at this time, unable to collect vitals. Will collect pt vitals once awake. 

## 2020-11-08 NOTE — BH Assessment (Signed)
Per previous TTS Staff Demetria on 12/29: TTS received call from [place] ([facility]) stating patient needs to receive COVID test on Friday 11/08/20 prior to his admission on 11/09/20. Results need to either be sent with patient or can be called in before patient leaves.  This writer communicated the need for another COVID test to EDP Dr. Dolores Frame, Dr. Dolores Frame reports that she will order a Rapid test for patient. TTS to follow up with results to [facility] facility.    Patient admission to [facility] continues to be scheduled for 11/09/20 pending results of Rapid COVID test.

## 2020-11-08 NOTE — ED Notes (Signed)
POC SARS Coronovaris test negative

## 2020-11-09 NOTE — ED Notes (Signed)
Hourly rounding reveals patient in room. No complaints, stable, in no acute distress. Q15 minute rounds and monitoring via [employer] to continue. 

## 2020-11-09 NOTE — ED Notes (Signed)
Pt given all his personal belongings. Discharge teaching done and pt verbalized his understanding of transfer to [facility] and is agreeable with it. Escorted to safe transport caregiver, A&Ox3, ambulating with steady gait. Pt and paperwork handed over to safe transport caregiver.

## 2020-11-09 NOTE — ED Notes (Addendum)
Called in to [facility] and spoke with [place] x2 attempting to give report. She stated that they do not take report since they have already accepted the pt and that all the documentation they needed to accept the pt was already faxed to them. She stated the pt needs to get there and just be able to do his ADL's and that they will figure any meds the pt needs when he gets there. Charge RN Marylene Land informed of facility not taking report. Pt A&Ox3, resp even and unlabored, ambulating with no difficulty and already knows that he is going to [facility] this morning. Ate breakfast with no issues.

## 2020-11-09 NOTE — Progress Notes (Signed)
Patient ID: Gary [name], male   DOB: 19-Mar-1986, 35 y.o.   MRN: [id number]  Pt offered a shower and is taking a shower.

## 2020-11-09 NOTE — Progress Notes (Signed)
Patient ID: Gary [name], male   DOB: [birth year], 35 y.o.   MRN: [id number]  Pt given breakfast tray.

## 2020-11-09 NOTE — ED Notes (Addendum)
VS not take, patient asleep. 

## 2020-12-04 ENCOUNTER — Encounter (HOSPITAL_COMMUNITY): Payer: Self-pay

## 2020-12-04 ENCOUNTER — Other Ambulatory Visit: Payer: Self-pay

## 2020-12-04 ENCOUNTER — Inpatient Hospital Stay (HOSPITAL_COMMUNITY)
Admission: EM | Admit: 2020-12-04 | Discharge: 2020-12-08 | DRG: 880 | Discharge status: Against Medical Advice | Payer: Self-pay | Attending: Pulmonary Disease | Admitting: Pulmonary Disease

## 2020-12-04 ENCOUNTER — Emergency Department (HOSPITAL_COMMUNITY): Payer: Self-pay

## 2020-12-04 DIAGNOSIS — F17210 Nicotine dependence, cigarettes, uncomplicated: Secondary | ICD-10-CM | POA: Diagnosis present

## 2020-12-04 DIAGNOSIS — Z79899 Other long term (current) drug therapy: Secondary | ICD-10-CM

## 2020-12-04 DIAGNOSIS — Z23 Encounter for immunization: Secondary | ICD-10-CM

## 2020-12-04 DIAGNOSIS — R45851 Suicidal ideations: Principal | ICD-10-CM | POA: Diagnosis present

## 2020-12-04 DIAGNOSIS — F10139 Alcohol abuse with withdrawal, unspecified: Secondary | ICD-10-CM | POA: Diagnosis present

## 2020-12-04 DIAGNOSIS — Z5902 Unsheltered homelessness: Secondary | ICD-10-CM

## 2020-12-04 DIAGNOSIS — F1510 Other stimulant abuse, uncomplicated: Secondary | ICD-10-CM | POA: Diagnosis present

## 2020-12-04 DIAGNOSIS — Z20822 Contact with and (suspected) exposure to covid-19: Secondary | ICD-10-CM | POA: Diagnosis present

## 2020-12-04 DIAGNOSIS — F10229 Alcohol dependence with intoxication, unspecified: Secondary | ICD-10-CM | POA: Diagnosis present

## 2020-12-04 DIAGNOSIS — F419 Anxiety disorder, unspecified: Secondary | ICD-10-CM | POA: Diagnosis present

## 2020-12-04 DIAGNOSIS — F32A Depression, unspecified: Secondary | ICD-10-CM | POA: Diagnosis present

## 2020-12-04 DIAGNOSIS — G629 Polyneuropathy, unspecified: Secondary | ICD-10-CM | POA: Diagnosis present

## 2020-12-04 DIAGNOSIS — F10239 Alcohol dependence with withdrawal, unspecified: Secondary | ICD-10-CM | POA: Diagnosis not present

## 2020-12-04 DIAGNOSIS — Z888 Allergy status to other drugs, medicaments and biological substances status: Secondary | ICD-10-CM

## 2020-12-04 DIAGNOSIS — F10939 Alcohol use, unspecified with withdrawal, unspecified: Secondary | ICD-10-CM

## 2020-12-04 DIAGNOSIS — Z811 Family history of alcohol abuse and dependence: Secondary | ICD-10-CM

## 2020-12-04 DIAGNOSIS — F1024 Alcohol dependence with alcohol-induced mood disorder: Secondary | ICD-10-CM | POA: Diagnosis present

## 2020-12-04 DIAGNOSIS — E876 Hypokalemia: Secondary | ICD-10-CM | POA: Diagnosis not present

## 2020-12-04 DIAGNOSIS — F1010 Alcohol abuse, uncomplicated: Secondary | ICD-10-CM

## 2020-12-04 DIAGNOSIS — Y908 Blood alcohol level of 240 mg/100 ml or more: Secondary | ICD-10-CM | POA: Diagnosis present

## 2020-12-04 DIAGNOSIS — E162 Hypoglycemia, unspecified: Secondary | ICD-10-CM | POA: Diagnosis present

## 2020-12-04 DIAGNOSIS — B20 Human immunodeficiency virus [HIV] disease: Secondary | ICD-10-CM | POA: Diagnosis present

## 2020-12-04 LAB — CBC
HCT: 44.4 % (ref 39.0–52.0)
Hemoglobin: 15 g/dL (ref 13.0–17.0)
MCH: 32.1 pg (ref 26.0–34.0)
MCHC: 33.8 g/dL (ref 30.0–36.0)
MCV: 95.1 fL (ref 80.0–100.0)
Platelets: 323 10*3/uL (ref 150–400)
RBC: 4.67 MIL/uL (ref 4.22–5.81)
RDW: 13.2 % (ref 11.5–15.5)
WBC: 7.6 10*3/uL (ref 4.0–10.5)
nRBC: 0 % (ref 0.0–0.2)

## 2020-12-04 LAB — COMPREHENSIVE METABOLIC PANEL
ALT: 26 U/L (ref 0–44)
AST: 37 U/L (ref 15–41)
Albumin: 4.5 g/dL (ref 3.5–5.0)
Alkaline Phosphatase: 51 U/L (ref 38–126)
Anion gap: 12 (ref 5–15)
BUN: 5 mg/dL — ABNORMAL LOW (ref 6–20)
CO2: 25 mmol/L (ref 22–32)
Calcium: 9.7 mg/dL (ref 8.9–10.3)
Chloride: 105 mmol/L (ref 98–111)
Creatinine, Ser: 0.67 mg/dL (ref 0.61–1.24)
GFR, Estimated: >60 mL/min (ref >60)
Glucose, Bld: 89 mg/dL (ref 70–99)
Potassium: 4 mmol/L (ref 3.5–5.1)
Sodium: 142 mmol/L (ref 135–145)
Total Bilirubin: 0.5 mg/dL (ref 0.3–1.2)
Total Protein: 8.2 g/dL — ABNORMAL HIGH (ref 6.5–8.1)

## 2020-12-04 LAB — SALICYLATE LEVEL: Salicylate Lvl: <7 mg/dL — ABNORMAL LOW (ref 7.0–30.0)

## 2020-12-04 LAB — ACETAMINOPHEN LEVEL: Acetaminophen (Tylenol), Serum: <10 ug/mL — ABNORMAL LOW (ref 10–30)

## 2020-12-04 LAB — ETHANOL: Alcohol, Ethyl (B): 433 mg/dL (ref <10)

## 2020-12-04 LAB — SARS CORONAVIRUS 2 BY RT PCR (HOSPITAL ORDER, PERFORMED IN ~~LOC~~ HOSPITAL LAB): SARS Coronavirus 2: NEGATIVE

## 2020-12-04 IMAGING — DX DG HAND COMPLETE 3+V*R*
3 series · 3 of 3 positions shown · non-contrast
Comparison: None.

CLINICAL DATA: 34-year-old male with right hand pain.

EXAM:
RIGHT HAND - COMPLETE 3+ VIEW

[hand ap]
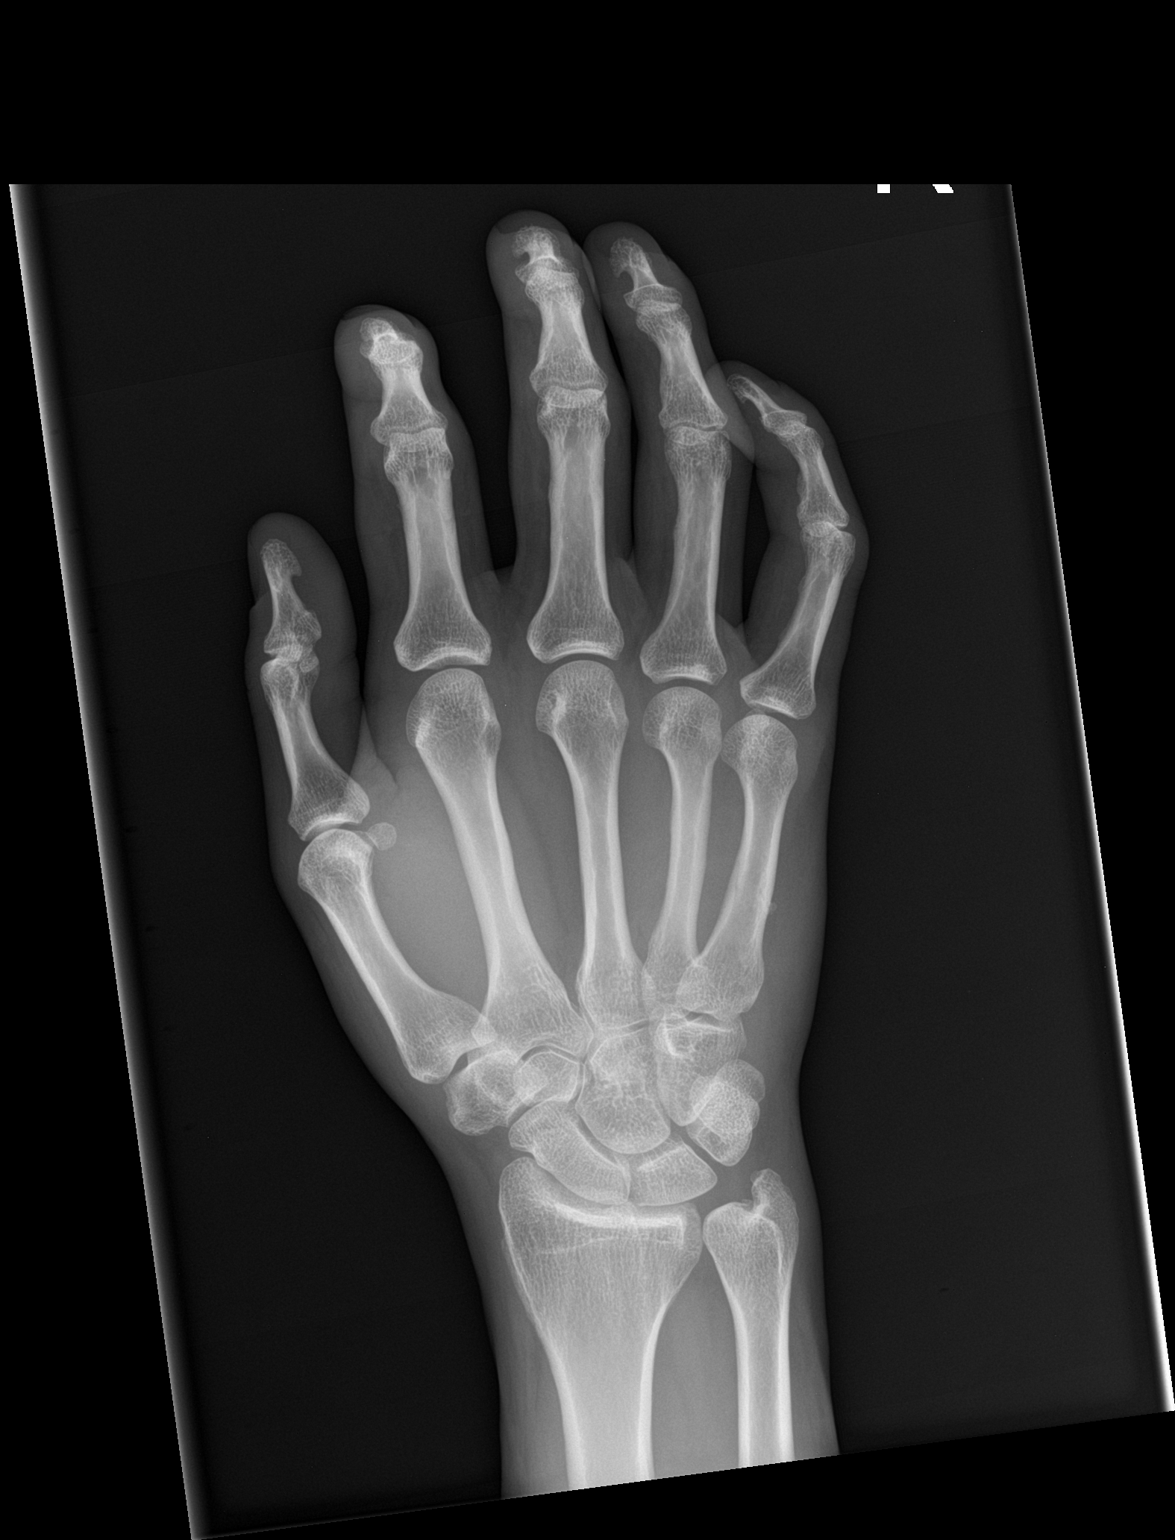

[hand obl]
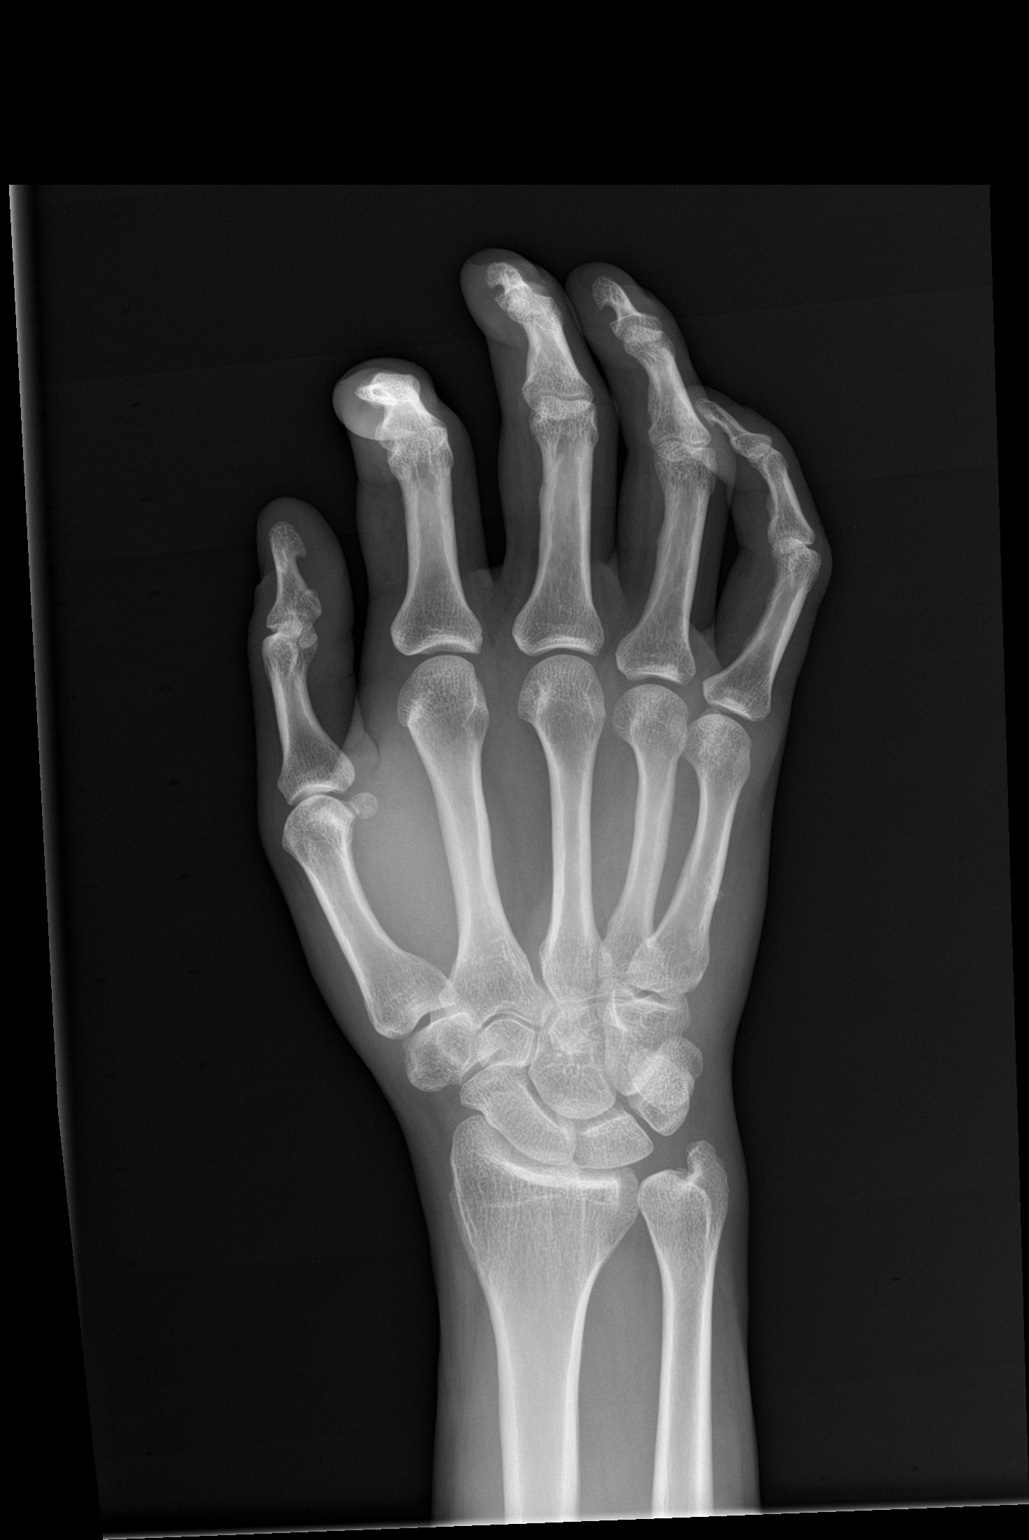

[hand lat]
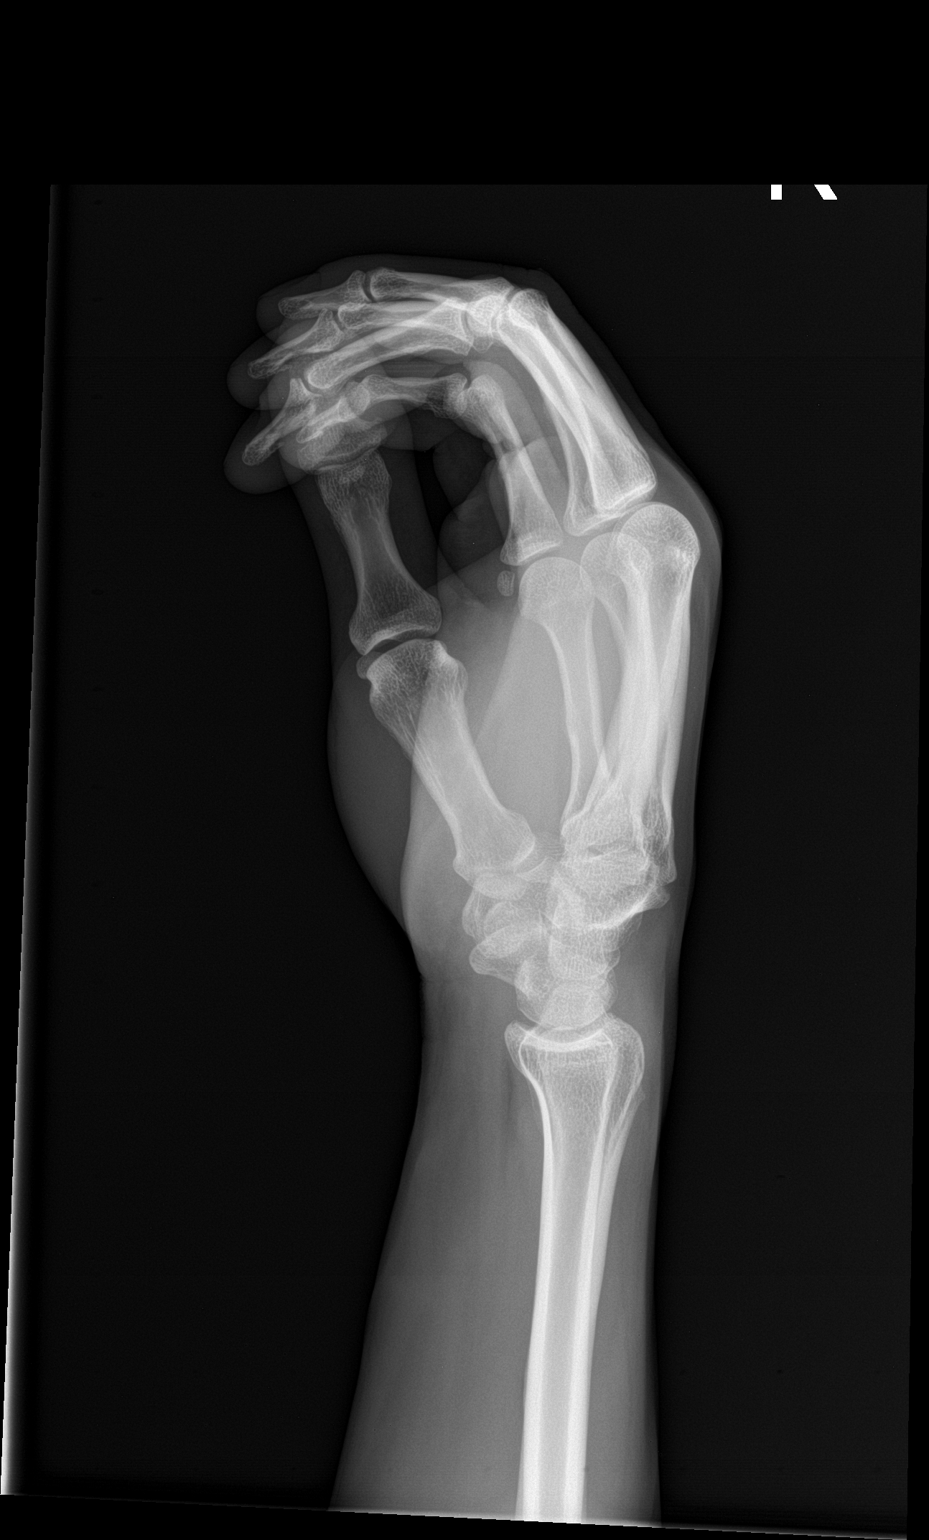

[3 of 3 positions shown; findings below may reference images not displayed]

FINDINGS: There is no evidence of fracture or dislocation. There is no
evidence of arthropathy or other focal bone abnormality. Soft
tissues are unremarkable.
IMPRESSION: Negative.

## 2020-12-04 MED ORDER — TETANUS-DIPHTH-ACELL PERTUSSIS 5-2.5-18.5 LF-MCG/0.5 IM SUSY
0.5000 mL | PREFILLED_SYRINGE | Freq: Once | INTRAMUSCULAR | Status: AC
  Administered 2020-12-04: 0.5 mL via INTRAMUSCULAR
  Filled 2020-12-04: qty 0.5

## 2020-12-04 NOTE — ED Provider Notes (Signed)
[facility] EMERGENCY DEPARTMENT Provider Note   CSN: [id number] Arrival date & time: 12/04/20  1904     History Chief Complaint  Patient presents with  . Suicidal    Gary [name] is a 35 y.o. male.  Gary [name] is a 35 y.o. male with a history of alcohol abuse, amphetamine abuse, HIV, who presents to the emergency department via [facility] with reported suicidal thoughts.  Patient also endorses drinking a large amount of alcohol and using meth prior to arrival.  Upon arrival patient appears intoxicated, is responsive but unwilling to answer most questions.  Patient repeatedly states that he is suicidal and wants to "give up".  He will not provide any further details, will not provide any plan for suicide or admitted or whether his drinking or drug use was in an attempt to harm himself tonight.  He denies any HI.  Denies AVH, but sometimes states that he sees "bad things".  Patient became a agitated and aggressive with EMS, became frustrated and punched the brick wall near an elevator.  He has abrasions to the right knuckles and complains of some pain in the hand.  Unsure of last tetanus vaccination.  Patient does not provide any additional history or report any other medical complaints.  Repeatedly states suicidal.  Level 5 caveat: Psychiatric disorder, alcohol intoxication        Past Medical History:  Diagnosis Date  . Alcohol abuse   . HIV (human immunodeficiency virus infection) (HCC)   . Subdural hematoma [facility])     Patient Active Problem List   Diagnosis Date Noted  . 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 Elevated troponin 10/19/2020  . Alcohol withdrawal (HCC) 10/19/2020  . Alcohol abuse   . Alcohol poisoning   . Suicidal ideation 06/14/2020  . Pancreatitis 06/13/2020  . 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  . Malnutrition of moderate degree 05/07/2020  . Gastrointestinal hemorrhage   . Alcoholic hepatitis without ascites   . Melena  05/05/2020  . Fever 05/05/2020  . Alcoholic ketoacidosis 05/05/2020  . Alcohol withdrawal with inpatient treatment, with unspecified complication (HCC) 04/17/2020  . Thrombocytopenia (HCC) 07/25/2019  . Transaminitis 07/25/2019  . Diarrhea 07/25/2019  . Traumatic subdural hematoma (HCC) 07/02/2019  . Alcohol abuse with intoxication (HCC) 07/02/2019  . Alcohol abuse with alcohol-induced mood disorder (HCC) 05/13/2019  . Alcohol dependence (HCC) 04/16/2019  . 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 HIV disease (HCC) 06/16/2018  . Polysubstance abuse (HCC) 06/16/2018  . Cigarette smoker 06/16/2018    Past Surgical History:  Procedure Laterality Date  . INCISION AND DRAINAGE PERIRECTAL ABSCESS N/A 09/24/2016   Procedure: IRRIGATION AND DEBRIDEMENT PERIRECTAL ABSCESS;  Surgeon: Ricarda Frame, MD;  Location: [facility] ORS;  Service: General;  Laterality: N/A;  . none         Family History  Problem Relation Age of Onset  . Alcohol abuse Mother   . Alcohol abuse Father     Social History   Tobacco Use  . Smoking status: Former Smoker    Packs/day: 0.50    Types: Cigarettes  . Smokeless tobacco: Never Used  . Tobacco comment: unable to smoke while incarcerated 6+ months 02/13/20  Vaping Use  . Vaping Use: Never used  Substance Use Topics  . Alcohol use: Yes  . Drug use: Not Currently    Types: Marijuana, Methamphetamines    Comment: last used 04/10/2019    Home Medications Prior to  Admission medications   Medication Sig Start Date End Date Taking? Authorizing Provider  elvitegravir-cobicistat-emtricitabine-tenofovir (GENVOYA) 150-150-200-10 MG TABS tablet Take 1 tablet by mouth daily with breakfast. Patient not taking: No sig reported 09/28/20   Clapacs, Jackquline Denmark, MD  FLUoxetine (PROZAC) 20 MG capsule Take 1 capsule (20 mg total) by mouth daily. Patient not taking: No sig reported 09/27/20 10/27/20  Clapacs, Jackquline Denmark, MD   nicotine (NICODERM CQ - DOSED IN MG/24 HOURS) 21 mg/24hr patch Place 1 patch (21 mg total) onto the skin daily. Patient not taking: No sig reported 05/09/20   Burnadette Pop, MD  pantoprazole (PROTONIX) 40 MG tablet Take 1 tablet (40 mg total) by mouth daily. Patient not taking: No sig reported 09/27/20   Clapacs, Jackquline Denmark, MD  famotidine (PEPCID) 20 MG tablet Take 1 tablet (20 mg total) by mouth 2 (two) times daily. Patient not taking: Reported on 06/01/2019 05/14/19 06/02/19  Benjiman Core, MD    Allergies    Tegretol [carbamazepine] and Caffeine  Review of Systems   Review of Systems  Unable to perform ROS: Psychiatric disorder    Physical Exam Updated Vital Signs BP 138/82 (BP Location: Right Arm)   Pulse 96   Temp 97.7 F (36.5 C) (Oral)   Resp 16   SpO2 97%   Physical Exam Vitals and nursing note reviewed.  Constitutional:      General: He is not in acute distress.    Appearance: He is well-developed and well-nourished. He is not diaphoretic.     Comments: Patient appears intoxicated, only falls asleep but is arousable with verbal stimuli, unwilling to answer most questions, not in acute distress  HENT:     Head: Normocephalic and atraumatic.     Comments: No hematoma, step-off or signs of head trauma.    Mouth/Throat:     Mouth: Oropharynx is clear and moist.  Eyes:     General:        Right eye: No discharge.        Left eye: No discharge.     Extraocular Movements: EOM normal.     Comments: Pupils dilated, but equal and reactive  Cardiovascular:     Rate and Rhythm: Normal rate and regular rhythm.     Pulses: Intact distal pulses.     Heart sounds: Normal heart sounds. No murmur heard. No friction rub. No gallop.   Pulmonary:     Effort: Pulmonary effort is normal. No respiratory distress.     Breath sounds: Normal breath sounds. No wheezing or rales.     Comments: Respirations equal and unlabored, patient able to speak in full sentences, lungs clear to  auscultation bilaterally  Abdominal:     General: Bowel sounds are normal. There is no distension.     Palpations: Abdomen is soft. There is no mass.     Tenderness: There is no abdominal tenderness. There is no guarding.     Comments: Abdomen soft, nondistended, nontender to palpation in all quadrants without guarding or peritoneal signs   Musculoskeletal:        General: No edema.     Cervical back: Neck supple.     Comments: Right hand with superficial abrasions over the knuckles, no larger lacerations requiring repair, patient able to move all fingers without difficulty, no significant palpable deformity or swelling, 2+ radial pulse and normal cap refill, normal sensation. All joints supple and easily movable, all compartments soft.  Skin:    General: Skin is warm and  dry.     Capillary Refill: Capillary refill takes less than 2 seconds.  Neurological:     Coordination: Coordination normal.     Comments: Patient intoxicated but able to state his name and date of birth, will answer some questions when he wants to and will follow commands Moving all extremities independently  Psychiatric:        Attention and Perception: He does not perceive auditory or visual hallucinations.        Mood and Affect: Affect is flat.        Speech: He is noncommunicative.        Behavior: Behavior is withdrawn.        Thought Content: Thought content includes suicidal ideation. Thought content does not include homicidal ideation. Thought content does not include homicidal or suicidal plan.     ED Results / Procedures / Treatments   Labs (all labs ordered are listed, but only abnormal results are displayed) Labs Reviewed  COMPREHENSIVE METABOLIC PANEL - Abnormal; Notable for the following components:      Result Value   BUN 5 (*)    Total Protein 8.2 (*)    All other components within normal limits  ETHANOL - Abnormal; Notable for the following components:   Alcohol, Ethyl (B) 433 (*)    All  other components within normal limits  SALICYLATE LEVEL - Abnormal; Notable for the following components:   Salicylate Lvl <7.0 (*)    All other components within normal limits  ACETAMINOPHEN LEVEL - Abnormal; Notable for the following components:   Acetaminophen (Tylenol), Serum <10 (*)    All other components within normal limits  SARS CORONAVIRUS 2 BY RT PCR (HOSPITAL ORDER, PERFORMED IN [place] [facility])  CBC  RAPID URINE DRUG SCREEN, HOSP PERFORMED    EKG EKG Interpretation  Date/Time:  Wednesday December 04 2020 19:53:15 EST Ventricular Rate:  88 PR Interval:    QRS Duration: 104 QT Interval:  389 QTC Calculation: 471 R Axis:   84 Text Interpretation: Sinus rhythm Borderline prolonged QT interval Confirmed by Cherlynn Perches ([id number]) on 12/04/2020 7:55:24 PM   Radiology DG Hand Complete Right  Result Date: 12/04/2020 CLINICAL DATA:  35 year old male with right hand pain. EXAM: RIGHT HAND - COMPLETE 3+ VIEW COMPARISON:  None. FINDINGS: There is no evidence of fracture or dislocation. There is no evidence of arthropathy or other focal bone abnormality. Soft tissues are unremarkable. IMPRESSION: Negative. Electronically Signed   By: Elgie Collard M.D.   On: 12/04/2020 20:14    Procedures Procedures   Medications Ordered in ED Medications  Tdap (BOOSTRIX) injection 0.5 mL (0.5 mLs Intramuscular Given 12/04/20 2003)    ED Course  I have reviewed the triage vital signs and the nursing notes.  Pertinent labs & imaging results that were available during my care of the patient were reviewed by me and considered in my medical decision making (see chart for details).    MDM Rules/Calculators/A&P                         35 year old male arrives via EMS reporting suicidal ideation endorses using a large amount of alcohol and methamphetamine prior to arrival.  Patient frequently falls asleep but is arousable by voice, is able to tell me his name and date of birth.  Repeatedly  states that he is suicidal but would not provide any further details.  Denies focal medical complaints aside from pain in his hands,  EMS reports that he became frustrated and punched a wall, and has abrasions to the knuckles but no larger lacerations requiring repair and no significant deformity.  X-ray of the hand is unremarkable.  Patient became agitated and aggressive with EMS and in triage is now more calm and primarily sleepy.  Medical clearance labs ordered as well as EKG.  Given patient's SI with agitation will allow IVC and first exam on patient.  Labs significant for ethanol level of 433 which is consistent with patient's intoxication, lab work is otherwise unremarkable, UDS still pending.  Covid screening negative.  EKG is unremarkable.  Again x-ray of the right hand with no evidence of fractures.  Tetanus updated.  Patient will need to metabolize alcohol and sober before he is cleared for TTS evaluation, but has been placed under IVC and first exam paperwork completed.  Care signed out to [facility] who will reevaluate patient and ensure he is sobering and cleared for psych eval.  The patient has been placed in psychiatric observation due to the need to provide a safe environment for the patient while obtaining psychiatric consultation and evaluation, as well as ongoing medical and medication management to treat the patient's condition.  The patient has been placed under full IVC at this time.   Final Clinical Impression(s) / ED Diagnoses Final diagnoses:  Suicidal ideation  Alcohol abuse    Rx / DC Orders ED Discharge Orders    None       Legrand Rams 12/04/20 2316    Sabino Donovan, MD 12/05/20 [phone]

## 2020-12-04 NOTE — ED Triage Notes (Signed)
Pt comes via [facility] after ETOH and meth use, pt is having SI thoughts with no plan, denies HI, pt is very aggressive in triage, clenched fist, punched an elevator, has abrasions to knuckles. Pt reports AVH, seeing "bad things"

## 2020-12-05 DIAGNOSIS — R45851 Suicidal ideations: Principal | ICD-10-CM

## 2020-12-05 DIAGNOSIS — F1010 Alcohol abuse, uncomplicated: Secondary | ICD-10-CM

## 2020-12-05 MED ORDER — ENOXAPARIN SODIUM 40 MG/0.4ML ~~LOC~~ SOLN
40.0000 mg | SUBCUTANEOUS | Status: DC
  Administered 2020-12-05: 40 mg via SUBCUTANEOUS
  Filled 2020-12-05 (×2): qty 0.4

## 2020-12-05 MED ORDER — GABAPENTIN 600 MG PO TABS
300.0000 mg | ORAL_TABLET | Freq: Three times a day (TID) | ORAL | Status: DC
Start: 2020-12-05 — End: 2020-12-06
  Administered 2020-12-05 (×3): 300 mg via ORAL
  Filled 2020-12-05: qty 1
  Filled 2020-12-05: qty 0.5
  Filled 2020-12-05 (×2): qty 1
  Filled 2020-12-05 (×2): qty 0.5

## 2020-12-05 MED ORDER — THIAMINE HCL 100 MG PO TABS
100.0000 mg | ORAL_TABLET | Freq: Every day | ORAL | Status: DC
  Administered 2020-12-07 – 2020-12-08 (×2): 100 mg via ORAL
  Filled 2020-12-05 (×2): qty 1

## 2020-12-05 MED ORDER — THIAMINE HCL 100 MG PO TABS: 100.0000 mg | ORAL_TABLET | Freq: Every day | ORAL | Status: DC

## 2020-12-05 MED ORDER — CHLORDIAZEPOXIDE HCL 25 MG PO CAPS
25.0000 mg | ORAL_CAPSULE | Freq: Four times a day (QID) | ORAL | Status: DC | PRN
  Administered 2020-12-05: 25 mg via ORAL
  Filled 2020-12-05: qty 1

## 2020-12-05 MED ORDER — CHLORDIAZEPOXIDE HCL 25 MG PO CAPS: 25.0000 mg | ORAL_CAPSULE | Freq: Three times a day (TID) | ORAL | Status: DC

## 2020-12-05 MED ORDER — LORAZEPAM 1 MG PO TABS: 0.0000 mg | ORAL_TABLET | Freq: Four times a day (QID) | ORAL | Status: DC

## 2020-12-05 MED ORDER — LOPERAMIDE HCL 2 MG PO CAPS
2.0000 mg | ORAL_CAPSULE | ORAL | Status: AC | PRN
  Filled 2020-12-05: qty 2

## 2020-12-05 MED ORDER — CHLORDIAZEPOXIDE HCL 25 MG PO CAPS: 25.0000 mg | ORAL_CAPSULE | Freq: Every day | ORAL | Status: DC

## 2020-12-05 MED ORDER — CHLORDIAZEPOXIDE HCL 25 MG PO CAPS
25.0000 mg | ORAL_CAPSULE | Freq: Four times a day (QID) | ORAL | Status: DC
  Administered 2020-12-05 (×2): 25 mg via ORAL
  Filled 2020-12-05 (×2): qty 1

## 2020-12-05 MED ORDER — CHLORDIAZEPOXIDE HCL 25 MG PO CAPS: 25.0000 mg | ORAL_CAPSULE | ORAL | Status: DC

## 2020-12-05 MED ORDER — NICOTINE 21 MG/24HR TD PT24
21.0000 mg | MEDICATED_PATCH | Freq: Every day | TRANSDERMAL | Status: DC
  Administered 2020-12-05 – 2020-12-07 (×2): 21 mg via TRANSDERMAL
  Filled 2020-12-05 (×2): qty 1

## 2020-12-05 MED ORDER — SODIUM CHLORIDE 0.9 % IV SOLN: INTRAVENOUS | Status: DC

## 2020-12-05 MED ORDER — LORAZEPAM 2 MG/ML IJ SOLN
INTRAMUSCULAR | Status: AC
  Administered 2020-12-05: 2 mg
  Filled 2020-12-05: qty 1

## 2020-12-05 MED ORDER — NICOTINE 21 MG/24HR TD PT24: 21.0000 mg | MEDICATED_PATCH | Freq: Every day | TRANSDERMAL | Status: DC

## 2020-12-05 MED ORDER — LORAZEPAM 2 MG/ML IJ SOLN
0.0000 mg | Freq: Four times a day (QID) | INTRAMUSCULAR | Status: DC
  Administered 2020-12-05: 4 mg via INTRAVENOUS
  Administered 2020-12-05: 2 mg via INTRAVENOUS
  Administered 2020-12-06: 1 mg via INTRAVENOUS
  Filled 2020-12-05 (×2): qty 2
  Filled 2020-12-05 (×2): qty 1

## 2020-12-05 MED ORDER — ADULT MULTIVITAMIN W/MINERALS CH
1.0000 | ORAL_TABLET | Freq: Every day | ORAL | Status: DC
  Administered 2020-12-05 – 2020-12-08 (×3): 1 via ORAL
  Filled 2020-12-05 (×3): qty 1

## 2020-12-05 MED ORDER — LORAZEPAM 2 MG/ML IJ SOLN: 0.0000 mg | Freq: Two times a day (BID) | INTRAMUSCULAR | Status: DC

## 2020-12-05 MED ORDER — THIAMINE HCL 100 MG/ML IJ SOLN
100.0000 mg | Freq: Every day | INTRAMUSCULAR | Status: DC
  Administered 2020-12-05 – 2020-12-06 (×2): 100 mg via INTRAVENOUS
  Filled 2020-12-05: qty 2

## 2020-12-05 MED ORDER — THIAMINE HCL 100 MG/ML IJ SOLN
100.0000 mg | Freq: Once | INTRAMUSCULAR | Status: DC
  Filled 2020-12-05: qty 2

## 2020-12-05 MED ORDER — ELVITEG-COBIC-EMTRICIT-TENOFAF 150-150-200-10 MG PO TABS: 1.0000 | ORAL_TABLET | Freq: Every day | ORAL | Status: DC

## 2020-12-05 MED ORDER — HYDROXYZINE HCL 25 MG PO TABS
25.0000 mg | ORAL_TABLET | Freq: Four times a day (QID) | ORAL | Status: AC | PRN
  Administered 2020-12-05 – 2020-12-07 (×2): 25 mg via ORAL
  Filled 2020-12-05 (×4): qty 1

## 2020-12-05 MED ORDER — LORAZEPAM 1 MG PO TABS: 0.0000 mg | ORAL_TABLET | Freq: Two times a day (BID) | ORAL | Status: DC

## 2020-12-05 MED ORDER — PANTOPRAZOLE SODIUM 40 MG PO TBEC
40.0000 mg | DELAYED_RELEASE_TABLET | Freq: Every day | ORAL | Status: DC
  Administered 2020-12-05 – 2020-12-07 (×2): 40 mg via ORAL
  Filled 2020-12-05 (×2): qty 1

## 2020-12-05 MED ORDER — ONDANSETRON 4 MG PO TBDP
4.0000 mg | ORAL_TABLET | Freq: Four times a day (QID) | ORAL | Status: AC | PRN
  Administered 2020-12-06: 4 mg via ORAL
  Filled 2020-12-05: qty 1

## 2020-12-05 NOTE — BH Assessment (Signed)
Comprehensive Clinical Assessment (CCA) Note  12/05/2020 Gary [name] [id number]   Gary [name] is a 35 year old male presenting under IVC to [facility] due to SI and alcohol intoxication. Patient consuming alcohol and using Meth prior to arrival. BAL is 433. When asked do you have a plan regarding suicide, patient stated "I will figure it out, I will think of one its not that difficult". Patient reported being homeless for past 2 months. Patient reported increased drinking and onset of SI in the past 2 weeks. Patient reported drinking 12-15 beers daily. During assessment patient reporting that his body hurt. Patient denied prior psych inpatient treatment, suicide attempts and self-harming behaviors. Patient reported hallucinations only when intoxicated. Patient reported worsening depressive symptoms. Patient became a agitated and aggressive with EMS, became frustrated and punched the brick wall near an elevator.  He has abrasions to the right knuckles and complains of some pain in the hand. Patient reported poor sleep and poor appetite. Patient reported no access to guns. Patient was cooperative during assessment.   PER TRIAGE NOTE Pt comes via [facility] after ETOH and meth use, pt is having SI thoughts with no plan, denies HI, pt is very aggressive in triage, clenched fist, punched an elevator, has abrasions to knuckles. Pt reports AVH, seeing "bad things"  Nira Conn, NP, recommends overnight observation for safety and stabilization with psych reassessment in the AM.   Chief Complaint:  Chief Complaint  Patient presents with  . Suicidal   Visit Diagnosis:  Alcohol dependence and Major depressive disorder  CCA Biopsychosocial Intake/Chief Complaint:  Pt reports trying to drink himself to death. Pt reports drinking to keep the shakes and seizures down  Current Symptoms/Problems: Pt reports trying to drink himself to death. Pt reports drinking to keep the shakes and seizures down  Patient Reported  Schizophrenia/Schizoaffective Diagnosis in Past: No data recorded  Strengths: NA  Preferences: NA  Abilities: NA  Type of Services Patient Feels are Needed: NA  Initial Clinical Notes/Concerns: Pt's safety  Mental Health Symptoms Depression:  Hopelessness; Fatigue; Change in energy/activity; Increase/decrease in appetite; Worthlessness; Tearfulness; Sleep (too much or little)   Duration of Depressive symptoms: Greater than two weeks   Mania:  Change in energy/activity   Anxiety:   Difficulty concentrating; Fatigue; Restlessness   Psychosis:  None (Pt denies any AVH)   Duration of Psychotic symptoms: No data recorded  Trauma:  N/A   Obsessions:  Disrupts routine/functioning; Poor insight; Cause anxiety; Recurrent & persistent thoughts/impulses/images   Compulsions:  N/A (UTA)   Inattention:  N/A   Hyperactivity/Impulsivity:  N/A   Oppositional/Defiant Behaviors:  N/A   Emotional Irregularity:  Recurrent suicidal behaviors/gestures/threats   Other Mood/Personality Symptoms:  No data recorded   Mental Status Exam Appearance and self-care  Stature:  Average   Weight:  Average weight   Clothing:  Neat/clean   Grooming:  Normal   Cosmetic use:  Age appropriate   Posture/gait:  Normal   Motor activity:  Not Remarkable   Sensorium  Attention:  Normal   Concentration:  Normal   Orientation:  X5   Recall/memory:  Normal   Affect and Mood  Affect:  Anxious; Depressed   Mood:  Anxious; Depressed   Relating  Eye contact:  Normal   Facial expression:  Depressed   Attitude toward examiner:  Cooperative   Thought and Language  Speech flow: Normal   Thought content:  Appropriate to Mood and Circumstances   Preoccupation:  Suicide  Hallucinations:  None (Pt denies)   Organization:  No data recorded  [employer] of Knowledge:  Fair   Intelligence:  -- Industrial/product designer)   Abstraction:  -- (UTA)   Judgement:  Impaired   Reality Testing:   -- (UTA)   Insight:  Poor   Decision Making:  -- (UTA)   Social Functioning  Social Maturity:  -- Industrial/product designer)   Social Judgement:  -- Industrial/product designer)   Stress  Stressors:  Housing; Surveyor, quantity (Pt reports being homeless)   Coping Ability:  Overwhelmed; Exhausted; Deficient supports (UTA)   Skill Deficits:  Decision making (UTA)   Supports:  Support needed (UTA)    Religion: Religion/Spirituality Are You A Religious Person?:  (UTA)  Leisure/Recreation: Leisure / Recreation Do You Have Hobbies?: No (UTA)  Exercise/Diet: Exercise/Diet Do You Exercise?:  (UTA) Have You Gained or Lost A Significant Amount of Weight in the Past Six Months?: Yes-Lost (Pt reports being 170 pounds, not pt reports being 150 pounds or even lighter) Do You Follow a Special Diet?:  (UTA) Do You Have Any Trouble Sleeping?: Yes Explanation of Sleeping Difficulties: "don't know"  CCA Employment/Education Employment/Work Situation: Employment / Work Situation Employment situation: Unemployed  Education: Education Is Patient Currently Attending School?: No Last Grade Completed: 12 Did Garment/textile technologist From [employer]?: Yes Did Theme park manager?: No Did Designer, television/film set?: No Did You Have An Individualized Education Program (IIEP): No Did You Have Any Difficulty At [employer]?: No Patient's Education Has Been Impacted by Current Illness: No  CCA Family/Childhood History Family and Relationship History: Family history Marital status: Single Long term relationship, how long?: uta Are you sexually active?:  (UTA) Does patient have children?: No (UTA)  Childhood History:  Childhood History By whom was/is the patient raised?:  (UTA) Additional childhood history information: uta Description of patient's relationship with caregiver when they were a child: uta Patient's description of current relationship with people who raised him/her: uta How were you disciplined when you got in trouble as a  child/adolescent?: uta Does patient have siblings?:  (uta) Did patient suffer any verbal/emotional/physical/sexual abuse as a child?: No (Pt denies but has witnessed it) Has patient ever been sexually abused/assaulted/raped as an adolescent or adult?: No (Pt denies) Was the patient ever a victim of a crime or a disaster?:  (uta) Witnessed domestic violence?:  (UTA) Has patient been affected by domestic violence as an adult?:  Industrial/product designer)  Child/Adolescent Assessment:   CCA Substance Use  Alcohol/Drug Use: Alcohol / Drug Use Pain Medications: see MAR Prescriptions: see MAR Over the Counter: see MAR History of alcohol / drug use?: Yes Substance #1 Name of Substance 1: alcohol 1 - Age of First Use: uta 1 - Amount (size/oz): 12-15 beers 1 - Frequency: daily 1 - Duration: uta 1 - Last Use / Amount: yesterday   ASAM's:  Six Dimensions of Multidimensional Assessment  Dimension 1:  Acute Intoxication and/or Withdrawal Potential:      Dimension 2:  Biomedical Conditions and Complications:      Dimension 3:  Emotional, Behavioral, or Cognitive Conditions and Complications:     Dimension 4:  Readiness to Change:     Dimension 5:  Relapse, Continued use, or Continued Problem Potential:     Dimension 6:  Recovery/Living Environment:     ASAM Severity Score:    ASAM Recommended Level of Treatment:     Substance use Disorder (SUD)   Recommendations for Services/Supports/Treatments:   DSM5 Diagnoses: Patient Active Problem List  Diagnosis Date Noted  . 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 Elevated troponin 10/19/2020  . Alcohol withdrawal (HCC) 10/19/2020  . Alcohol abuse   . Alcohol poisoning   . Suicidal ideation 06/14/2020  . Pancreatitis 06/13/2020  . 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  . Malnutrition of moderate degree 05/07/2020  . Gastrointestinal hemorrhage   . Alcoholic hepatitis without ascites   . Melena 05/05/2020  . Fever 05/05/2020  . Alcoholic  ketoacidosis 05/05/2020  . Alcohol withdrawal with inpatient treatment, with unspecified complication (HCC) 04/17/2020  . Thrombocytopenia (HCC) 07/25/2019  . Transaminitis 07/25/2019  . Diarrhea 07/25/2019  . Traumatic subdural hematoma (HCC) 07/02/2019  . Alcohol abuse with intoxication (HCC) 07/02/2019  . Alcohol abuse with alcohol-induced mood disorder (HCC) 05/13/2019  . Alcohol dependence (HCC) 04/16/2019  . 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 HIV disease (HCC) 06/16/2018  . Polysubstance abuse (HCC) 06/16/2018  . Cigarette smoker 06/16/2018    Patient Centered Plan: Patient is on the following Treatment Plan(s):    Referrals to Alternative Service(s): Referred to Alternative Service(s):   Place:   Date:   Time:    Referred to Alternative Service(s):   Place:   Date:   Time:    Referred to Alternative Service(s):   Place:   Date:   Time:    Referred to Alternative Service(s):   Place:   Date:   Time:     Burnetta Sabin, [facility]

## 2020-12-05 NOTE — ED Notes (Signed)
Hospitalist at bedside 

## 2020-12-05 NOTE — ED Provider Notes (Signed)
Emergency Medicine Observation Re-evaluation Note  Gary [name] is a 35 y.o. male, seen on rounds today.  Pt initially presented to the ED for complaints of Suicidal Currently, the patient is resting comfortably.  Physical Exam  BP 102/60   Pulse 89   Temp 98.3 F (36.8 C) (Oral)   Resp 18   SpO2 99%  Physical Exam General: in bed, answers to name Skin: warm and dry Lungs: no respiratory distress, unlabored breathing Psych: currently calm and resting  ED Course / MDM  EKG:EKG Interpretation  Date/Time:  Wednesday December 04 2020 19:53:15 EST Ventricular Rate:  88 PR Interval:    QRS Duration: 104 QT Interval:  389 QTC Calculation: 471 R Axis:   84 Text Interpretation: Sinus rhythm Borderline prolonged QT interval Confirmed by Cherlynn Perches ([id number]) on 12/04/2020 7:55:24 PM    I have reviewed the labs performed to date as well as medications administered while in observation.   Plan  Current plan is for psych reevaluation this morning.   Rozelle Logan, [place] 12/05/20 [id number]

## 2020-12-05 NOTE — H&P (Addendum)
Family Medicine Teaching [facility] Admission History and Physical Service Pager: [phone]  Patient name: Gary [name] Medical record number: [id number] Date of birth: 03/27/1986 Age: 35 y.o. Gender: male  Primary Care Provider: Patient, No Pcp Per Consultants: Psychiatry Code Status: Full Preferred Emergency Contact: Mom, Brother (contact info unknown at this time)  Chief Complaint: "I've been drinking"  Assessment and Plan: Gary [name] is a 35 y.o. male presenting with acute alcohol withdrawal. PMH is significant for HIV and polysubstance abuse.  Acute Alcohol Withdrawal  Polysubstance Abuse Patient presented to the ED on 1/26 for suicidal ideation after drinking large amounts of alcohol and using meth. His ethanol level on arrival was 433. He was evaluated by psychiatry this morning and cleared for discharge from their standpoint. However, patient became tachycardic to the 140s and tremulous, concerning for acute alcohol withdrawal. He was started on Librium taper and Ativan in the ED. Patient has a history of prior alcohol withdrawal resulting in seizures (never required ICU admission or intubation). He endorsed desire to remain sober on admission. Labs including CBC, CMP, salicylate level and Tylenol level were unremarkable. Last known drink is not clear, patient is very sedated upon encounter.  -Admit to FPTS, progressive, attending Dr. Leveda Anna -Vitals per routine, cardiac monitoring -OOB with assistance only -Suicide precautions, 1:1 sitter -F/u urine drug screen results -CIWA monitoring with Ativan -d/c Librium -Thiamine, folic acid, multivitamin -N.p.o. due to aspiration risk -PT/OT eval and treat -Nicotine patch 21 mg -A.m. CBC, CMP, Phos, mag -TOC consult for substance abuse, homelessness  HIV Takes Genvoya daily- unclear compliance. Most recent HIV RNA quant from April 2021 was <20. -Will obtain acute hepatitis panel -HIV RNA quant -Will restart home  Genvoya if hep panel is negative  FEN/GI: N.p.o. Prophylaxis: Lovenox  Disposition: Progressive  History of Present Illness:  Gary [name] is a 35 y.o. male presenting with suicidal ideation after drinking large amounts of alcohol and using meth.  Patient reports he has been drinking more than usual recently. States he drinks 10 vodkas per day (unclear volume per drink). Wanted to cut back but instead started drinking more. His alcohol level was 433 on arrival.  He currently complains of body aches all over. Also endorses some recent diarrhea. Denies fever, cough, chest pain, abdominal pain, vomiting.  Patient has a history of prior withdrawal resulting in seizures. Denies being admitted to the ICU or requiring intubation in the past.   Patient is currently homeless, but has [facility] a church.  Per sign out from ED provider, patient had hand injury from violent encounter punching the wall. Xray was negative for fracture. Patient has bandages over finger abrasions.   Review Of Systems: Per HPI with the following additions:   Review of Systems  Constitutional: Negative for fever.  Respiratory: Negative for cough.   Cardiovascular: Negative for chest pain.  Gastrointestinal: Positive for diarrhea. Negative for abdominal pain and vomiting.  Musculoskeletal: Positive for myalgias.  Psychiatric/Behavioral: Positive for suicidal ideas.     Patient Active Problem List   Diagnosis Date Noted  . 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 Elevated troponin 10/19/2020  . Alcohol withdrawal (HCC) 10/19/2020  . Alcohol abuse   . Alcohol poisoning   . Suicidal ideation 06/14/2020  . Pancreatitis 06/13/2020  . 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  . Malnutrition of moderate degree 05/07/2020  . Gastrointestinal hemorrhage   . Alcoholic hepatitis without ascites   . Melena 05/05/2020  . Fever 05/05/2020  .  Alcoholic ketoacidosis 05/05/2020  . Thrombocytopenia (HCC)  07/25/2019  . Transaminitis 07/25/2019  . Diarrhea 07/25/2019  . Traumatic subdural hematoma (HCC) 07/02/2019  . Alcohol abuse with intoxication (HCC) 07/02/2019  . Alcohol abuse with alcohol-induced mood disorder (HCC) 05/13/2019  . Alcohol dependence (HCC) 04/16/2019  . 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 HIV disease (HCC) 06/16/2018  . Polysubstance abuse (HCC) 06/16/2018  . Cigarette smoker 06/16/2018    Past Medical History: Past Medical History:  Diagnosis Date  . Alcohol abuse   . HIV (human immunodeficiency virus infection) (HCC)   . Subdural hematoma [facility])     Past Surgical History: Past Surgical History:  Procedure Laterality Date  . INCISION AND DRAINAGE PERIRECTAL ABSCESS N/A 09/24/2016   Procedure: IRRIGATION AND DEBRIDEMENT PERIRECTAL ABSCESS;  Surgeon: Ricarda Frame, MD;  Location: [facility] ORS;  Service: General;  Laterality: N/A;  . none      Social History: Social History   Tobacco Use  . Smoking status: Former Smoker    Packs/day: 0.50    Types: Cigarettes  . Smokeless tobacco: Never Used  . Tobacco comment: unable to smoke while incarcerated 6+ months 02/13/20  Vaping Use  . Vaping Use: Never used  Substance Use Topics  . Alcohol use: Yes  . Drug use: Not Currently    Types: Marijuana, Methamphetamines    Comment: last used 04/10/2019    Family History: Family History  Problem Relation Age of Onset  . Alcohol abuse Mother   . Alcohol abuse Father     Allergies and Medications: Allergies  Allergen Reactions  . Tegretol [Carbamazepine] Other (See Comments)    Caused vertigo for 2 days after taking it  . Caffeine Palpitations   No current facility-administered medications on file prior to encounter.   Current Outpatient Medications on File Prior to Encounter  Medication Sig Dispense Refill  . elvitegravir-cobicistat-emtricitabine-tenofovir (GENVOYA) 150-150-200-10 MG TABS tablet Take 1  tablet by mouth daily with breakfast. (Patient not taking: No sig reported) 30 tablet 0  . FLUoxetine (PROZAC) 20 MG capsule Take 1 capsule (20 mg total) by mouth daily. (Patient not taking: No sig reported) 30 capsule 0  . nicotine (NICODERM CQ - DOSED IN MG/24 HOURS) 21 mg/24hr patch Place 1 patch (21 mg total) onto the skin daily. (Patient not taking: No sig reported) 28 patch 0  . pantoprazole (PROTONIX) 40 MG tablet Take 1 tablet (40 mg total) by mouth daily. (Patient not taking: No sig reported) 30 tablet 0  . [DISCONTINUED] famotidine (PEPCID) 20 MG tablet Take 1 tablet (20 mg total) by mouth 2 (two) times daily. (Patient not taking: Reported on 06/01/2019) 30 tablet 0    Objective: BP 114/73   Pulse (!) 121   Temp 98.3 F (36.8 C) (Oral)   Resp (!) 21   SpO2 99%  Exam: General: Sleepy, falls asleep intermittently during conversation Eyes: PERRL ENTM: Moist mucous membranes Neck: No cervical lymphadenopathy Cardiovascular: Tachycardic, normal S1/S2 without M/R/G Respiratory: Normal WOB on room air, lungs CTAB Gastrointestinal: +BS, soft, nontender, nondistended Derm: No significant skin lesions noted, no rashes Neuro: Sleepy but easily arousable, grossly intact, no tremulousness Psych: Poor eye contact, hunched-over posture, flat affect, does not appear to be responding to internal stimuli, quiet but normal speech  Labs and Imaging: CBC BMET  Recent Labs  Lab 12/04/20 1931  WBC 7.6  HGB 15.0  HCT 44.4  PLT 323   Recent Labs  Lab 12/04/20 1931  NA  142  K 4.0  CL 105  CO2 25  BUN 5*  CREATININE 0.67  GLUCOSE 89  CALCIUM 9.7    Ethanol 433  EKG: My own interpretation (not copied from electronic read): NSR at 90bpm, corrected QTC 441  DG Hand Complete Right Result Date: 12/04/2020 CLINICAL DATA:  35 year old male with right hand pain. EXAM: RIGHT HAND - COMPLETE 3+ VIEW COMPARISON:  None. FINDINGS: There is no evidence of fracture or dislocation. There is no  evidence of arthropathy or other focal bone abnormality. Soft tissues are unremarkable. IMPRESSION: Negative. Electronically Signed   By: Elgie Collard M.D.   On: 12/04/2020 20:14    Maury Dus, MD 12/05/2020, 1:57 PM PGY-1, [facility] Family Medicine FPTS Intern pager: [phone], text pages welcome  RESIDENT ATTESTATION    I have seen and examined this patient.     I have discussed the findings and exam with the intern and agree with the above note, which I have edited appropriately. I helped develop the management plan that is described in the resident's note, and I agree with the content.   Jamelle Rushing, DO PGY-3 Family Medicine Resident

## 2020-12-05 NOTE — ED Provider Notes (Signed)
It was called to my attention by the nurse that the patient was showing acute alcohol withdrawal symptoms.  When he was seen originally on rounds earlier this morning his vitals were normal, he was resting comfortably.  His last alcoholic drink he claims was yesterday.  On arrival his alcohol level was over 400.  On my evaluation now patient is tachycardic, hypertensive, tremulous.  He appears to be in acute alcohol withdrawal.  He reports history of withdrawal seizures.  2 mg of Ativan given IM.  IV is being placed and we will give medications IV per CIWA protocol.  Patient is amendable to staying in the hospital for detox.  Plan for medical admission.  He is Covid negative.   Rozelle Logan, DO 12/05/20 1203

## 2020-12-05 NOTE — ED Notes (Signed)
Patient just woke up went to bathroom and back to his room with sitter at door .talking to sitter

## 2020-12-05 NOTE — BH Assessment (Signed)
[name], NP, recommends overnight observation for safety and stabilization with psych reassessment in the AM.   

## 2020-12-05 NOTE — Progress Notes (Signed)
Interim progress note  Reviewing patient's chart, vital signs stable, CBC within normal limits, CMP grossly unremarkable.  Patient being admitted for alcohol withdrawal, also has a history of recent methamphetamine use on 1/26.  There are studies that suggest possible increased risk of rhabdomyolysis with methamphetamine use.  Upon chart review patient is currently n.p.o. and has not received any fluids while in the hospital, he has also been unable to urinate thus far. The patient did receive some water at bedside which he has been able to drink without issue, however due to being sleepy after receiving Librium and total of 10 mg of Ativan since noon today he is not waking up as frequently to drink.   Order changes: -Added on one-time creatinine kinase to patient's labs -placed order for IV normal saline at 100 cc/hour for the next 10 hours (10 PM-8 AM).  Peggyann Shoals, DO [facility] Family Medicine, PGY-3 12/05/2020 10:17 PM

## 2020-12-05 NOTE — Consult Note (Addendum)
Telepsych Consultation   Reason for Consult:  Alcohol intoxication, passive suicidal thoughts  Referring Physician:  EDP Location of Patient: [facility] ED Location of Provider: [facility]  Patient Identification: Gary [name] MRN:  [id number] Principal Diagnosis: Alcohol withdrawal (HCC) Diagnosis:  Principal Problem:   Alcohol withdrawal (HCC)   Total Time spent with patient: 20 minutes  Subjective: " I am here because I need help with my alcohol use and my brother IVC'd me because he knows I need help."   HPI Per TSS Assessment:Gary [name] is a 35 year old male presenting under IVC to [facility] due to SI and alcohol intoxication. Patient consuming alcohol and using Meth prior to arrival. BAL is 433. When asked do you have a plan regarding suicide, patient stated "I will figure it out, I will think of one its not that difficult". Patient reported being homeless for past 2 months. Patient reported increased drinking and onset of SI in the past 2 weeks. Patient reported drinking 12-15 beers daily. During assessment patient reporting that his body hurt. Patient denied prior psych inpatient treatment, suicide attempts and self-harming behaviors. Patient reported hallucinations only when intoxicated. Patient reported worsening depressive symptoms. Patient became a agitated and aggressive with EMS, became frustrated and punched the brick wall near an elevator. He has abrasions to the right knuckles and complains of some pain in the hand. Patient reported poor sleep and poor appetite. Patient reported no access to guns. Patient was cooperative during assessment.   Psychiatric Evaluation:  [name] is a 35 year old male who presented to the ED under IVC due to SI and alcohol intoxication. Per review of chart, patients psychiatric history is significant for polysubstance abuse, substance abuse induced mood disorder, MDD without psychotic features., passive suicidal thoughts. Patient has  been evaluated in numerous ED's for these issues per review of chart.   On evaluation, patient is alert and oriented x4, calm and cooperative. He visually has withdrawal symptoms noted  As severe bilateral hand tremors. He reported additional; withdrawal symptoms as nausea, heat intolerance, chest discomfort, and other GI upset. His ethanol level  on admission was  433. Although he has a history of methamphetamine  abuse, he denied recent use. His UDS is pending. Patient reported that he has been drinking alcohol for many years.  Reported on average, drinking at least 15-20 beers per day and reported that he occasionally drink liquor. Reported a history of  Seizures, blackouts, and hallucinations secondary to alcohol use. He denied any legal issues in relation. He reported that he has participated in shorterm rehabilitation programs for his substance use with most recent being a 14 day stay at [facility] the beginning  of this year. When asked if  he was intrested in a long-term program, he replied," I don't know."  Patient reported being homeless for the past two years and described this as," I either living on the street or with others." He reported struggling with anxiety but denied any symptoms of depression.He denied current SI, HI and psychosis. He denied prior suicide attempts and self-harming behaviors. He denied prior inpatient psychiatric admissions. Reported no current participation in outpatient substance abuse services. Denied having other psychiatric outpatient services. Denied access to firearms.     Past Psychiatric History: Polysubstance abuse, substance abuse induced mood disorder, MDD without psychotic features., passive suicidal thoughts.  Risk to Self:  No Risk to Others:  No Prior Inpatient Therapy:  No Prior Outpatient Therapy:  No  Past Medical History:  Past Medical History:  Diagnosis Date  . Alcohol abuse   . HIV (human immunodeficiency virus infection) (HCC)   . Subdural  hematoma [facility])     Past Surgical History:  Procedure Laterality Date  . INCISION AND DRAINAGE PERIRECTAL ABSCESS N/A 09/24/2016   Procedure: IRRIGATION AND DEBRIDEMENT PERIRECTAL ABSCESS;  Surgeon: Ricarda Frame, MD;  Location: [facility] ORS;  Service: General;  Laterality: N/A;  . none     Family History:  Family History  Problem Relation Age of Onset  . Alcohol abuse Mother   . Alcohol abuse Father    Family Psychiatric  History: None noted  Social History:  Social History   Substance and Sexual Activity  Alcohol Use Yes     Social History   Substance and Sexual Activity  Drug Use Not Currently  . Types: Marijuana, Methamphetamines   Comment: last used 04/10/2019    Social History   Socioeconomic History  . Marital status: Single    Spouse name: Not on file  . Number of children: Not on file  . Years of education: Not on file  . Highest education level: Not on file  Occupational History  . Occupation: unemployed  Tobacco Use  . Smoking status: Former Smoker    Packs/day: 0.50    Types: Cigarettes  . Smokeless tobacco: Never Used  . Tobacco comment: unable to smoke while incarcerated 6+ months 02/13/20  Vaping Use  . Vaping Use: Never used  Substance and Sexual Activity  . Alcohol use: Yes  . Drug use: Not Currently    Types: Marijuana, Methamphetamines    Comment: last used 04/10/2019  . Sexual activity: Not Currently    Comment: unable to offer condoms while incarcerated  Other Topics Concern  . Not on file  Social History Narrative   "Currently living on the streets"   Independent at baseline   Social Determinants of Health   Financial Resource Strain: Not on file  Food Insecurity: Not on file  Transportation Needs: Not on file  Physical Activity: Not on file  Stress: Not on file  Social Connections: Not on file   Additional Social History:    Allergies:   Allergies  Allergen Reactions  . Tegretol [Carbamazepine] Other (See Comments)    Caused  vertigo for 2 days after taking it  . Caffeine Palpitations    Labs:  Results for orders placed or performed during the hospital encounter of 12/04/20 (from the past 48 hour(s))  Comprehensive metabolic panel     Status: Abnormal   Collection Time: 12/04/20  7:31 PM  Result Value Ref Range   Sodium 142 135 - 145 mmol/L   Potassium 4.0 3.5 - 5.1 mmol/L   Chloride 105 98 - 111 mmol/L   CO2 25 22 - 32 mmol/L   Glucose, Bld 89 70 - 99 mg/dL    Comment: Glucose reference range applies only to samples taken after fasting for at least 8 hours.   BUN 5 (L) 6 - 20 mg/dL   Creatinine, Ser 6.83 0.61 - 1.24 mg/dL   Calcium 9.7 8.9 - 41.9 mg/dL   Total Protein 8.2 (H) 6.5 - 8.1 g/dL   Albumin 4.5 3.5 - 5.0 g/dL   AST 37 15 - 41 U/L   ALT 26 0 - 44 U/L   Alkaline Phosphatase 51 38 - 126 U/L   Total Bilirubin 0.5 0.3 - 1.2 mg/dL   GFR, Estimated >62 >22 mL/min    Comment: (NOTE) Calculated using the CKD-EPI  Creatinine Equation (2021)    Anion gap 12 5 - 15    Comment: Performed at [facility], [address] [place], [place] [address]  Ethanol     Status: Abnormal   Collection Time: 12/04/20  7:31 PM  Result Value Ref Range   Alcohol, Ethyl (B) 433 (HH) <10 mg/dL    Comment: CRITICAL RESULT CALLED TO, READ BACK BY AND VERIFIED WITH: D SIDBURY,RN 2047 12/04/2020 WBOND (NOTE) Lowest detectable limit for serum alcohol is 10 mg/dL.  For medical purposes only. Performed at [facility], [address], [place], [place] [address]   Salicylate level     Status: Abnormal   Collection Time: 12/04/20  7:31 PM  Result Value Ref Range   Salicylate Lvl <7.0 (L) 7.0 - 30.0 mg/dL    Comment: Performed at [facility], [address] [place], [place] [address]  Acetaminophen level     Status: Abnormal   Collection Time: 12/04/20  7:31 PM  Result Value Ref Range   Acetaminophen (Tylenol), Serum <10 (L) 10 - 30 ug/mL    Comment: (NOTE) Therapeutic concentrations vary  significantly. A range of 10-30 ug/mL  may be an effective concentration for many patients. However, some  are best treated at concentrations outside of this range. Acetaminophen concentrations >150 ug/mL at 4 hours after ingestion  and >50 ug/mL at 12 hours after ingestion are often associated with  toxic reactions.  Performed at [facility], [address], [place], [place] [address]   cbc     Status: None   Collection Time: 12/04/20  7:31 PM  Result Value Ref Range   WBC 7.6 4.0 - 10.5 K/uL   RBC 4.67 4.22 - 5.81 MIL/uL   Hemoglobin 15.0 13.0 - 17.0 g/dL   HCT 40.1 02.7 - 25.3 %   MCV 95.1 80.0 - 100.0 fL   MCH 32.1 26.0 - 34.0 pg   MCHC 33.8 30.0 - 36.0 g/dL   RDW 66.4 40.3 - 47.4 %   Platelets 323 150 - 400 K/uL   nRBC 0.0 0.0 - 0.2 %    Comment: Performed at [facility], [address], [place], [place] [address]  SARS Coronavirus 2 by RT PCR (hospital order, performed in [facility] hospital lab) Nasopharyngeal Nasopharyngeal Swab     Status: None   Collection Time: 12/04/20  7:51 PM   Specimen: Nasopharyngeal Swab  Result Value Ref Range   SARS Coronavirus 2 NEGATIVE NEGATIVE    Comment: (NOTE) SARS-CoV-2 target nucleic acids are NOT DETECTED.  The SARS-CoV-2 RNA is generally detectable in upper and lower respiratory specimens during the acute phase of infection. The lowest concentration of SARS-CoV-2 viral copies this assay can detect is 250 copies / mL. A negative result does not preclude SARS-CoV-2 infection and should not be used as the sole basis for treatment or other patient management decisions.  A negative result may occur with improper specimen collection / handling, submission of specimen other than nasopharyngeal swab, presence of viral mutation(s) within the areas targeted by this assay, and inadequate number of viral copies (<250 copies / mL). A negative result must be combined with clinical observations, patient history, and  epidemiological information.  Fact Sheet for Patients:   BoilerBrush.com.cy  Fact Sheet for Healthcare Providers: https://pope.com/  This test is not yet approved or  cleared by the [place] FDA and has been authorized for detection and/or diagnosis of SARS-CoV-2 by FDA  under an Emergency Use Authorization (EUA).  This EUA will remain in effect (meaning this test can be used) for the duration of the COVID-19 declaration under Section 564(b)(1) of the Act, 21 U.S.C. section 360bbb-3(b)(1), unless the authorization is terminated or revoked sooner.  Performed at [facility], [address] [place], [place] [address]     Medications:  Current Facility-Administered Medications  Medication Dose Route Frequency Provider Last Rate Last Admin  . chlordiazePOXIDE (LIBRIUM) capsule 25 mg  25 mg Oral Q6H PRN Denzil Magnusonhomas, Lashunda, NP      . chlordiazePOXIDE (LIBRIUM) capsule 25 mg  25 mg Oral QID Denzil Magnusonhomas, Lashunda, NP   25 mg at 12/05/20 1049   Followed by  . [START ON 12/06/2020] chlordiazePOXIDE (LIBRIUM) capsule 25 mg  25 mg Oral TID Denzil Magnusonhomas, Lashunda, NP       Followed by  . [START ON 12/07/2020] chlordiazePOXIDE (LIBRIUM) capsule 25 mg  25 mg Oral Jamelle HaringBH-qamhs Thomas, Lashunda, NP       Followed by  . [START ON 12/08/2020] chlordiazePOXIDE (LIBRIUM) capsule 25 mg  25 mg Oral Daily Denzil Magnusonhomas, Lashunda, NP      . hydrOXYzine (ATARAX/VISTARIL) tablet 25 mg  25 mg Oral Q6H PRN Denzil Magnusonhomas, Lashunda, NP      . loperamide (IMODIUM) capsule 2-4 mg  2-4 mg Oral PRN Denzil Magnusonhomas, Lashunda, NP      . multivitamin with minerals tablet 1 tablet  1 tablet Oral Daily Denzil Magnusonhomas, Lashunda, NP   1 tablet at 12/05/20 1048  . ondansetron (ZOFRAN-ODT) disintegrating tablet 4 mg  4 mg Oral Q6H PRN Denzil Magnusonhomas, Lashunda, NP      . thiamine (B-1) injection 100 mg  100 mg Intramuscular Once Denzil Magnusonhomas, Lashunda, NP      . Melene Muller[START ON 12/06/2020] thiamine tablet 100 mg  100 mg Oral Daily  Denzil Magnusonhomas, Lashunda, NP       Current Outpatient Medications  Medication Sig Dispense Refill  . elvitegravir-cobicistat-emtricitabine-tenofovir (GENVOYA) 150-150-200-10 MG TABS tablet Take 1 tablet by mouth daily with breakfast. (Patient not taking: No sig reported) 30 tablet 0  . FLUoxetine (PROZAC) 20 MG capsule Take 1 capsule (20 mg total) by mouth daily. (Patient not taking: No sig reported) 30 capsule 0  . nicotine (NICODERM CQ - DOSED IN MG/24 HOURS) 21 mg/24hr patch Place 1 patch (21 mg total) onto the skin daily. (Patient not taking: No sig reported) 28 patch 0  . pantoprazole (PROTONIX) 40 MG tablet Take 1 tablet (40 mg total) by mouth daily. (Patient not taking: No sig reported) 30 tablet 0    Musculoskeletal: Unable to access as evaluation via telepsych   Psychiatric Specialty Exam: Physical Exam Vitals and nursing note reviewed.  Psychiatric:        Behavior: Behavior normal.        Thought Content: Thought content normal.        Judgment: Judgment normal.     Comments: Mood- anxious      Review of Systems  Gastrointestinal: Positive for nausea.  Neurological: Positive for tremors.  Psychiatric/Behavioral: Negative for agitation, behavioral problems, confusion, decreased concentration, dysphoric mood, hallucinations, self-injury, sleep disturbance and suicidal ideas. The patient is nervous/anxious. The patient is not hyperactive.     Blood pressure 102/60, pulse 89, temperature 98.3 F (36.8 C), temperature source Oral, resp. rate 18, SpO2 99 %.There is no height or weight on file to calculate BMI.  General Appearance: Disheveled  Eye Contact:  Minimal  Speech:  Clear and Coherent and Normal Rate  Volume:  Normal  Mood:  Anxious  Affect:  Congruent  Thought Process:  Coherent, Linear and Descriptions of Associations: Intact  Orientation:  Full (Time, Place, and Person)  Thought Content:  Logical  Suicidal Thoughts:  No  Homicidal Thoughts:  No  Memory:  Immediate;    Fair Recent;   Fair  Judgement:  Impaired  Insight:  Shallow  Psychomotor Activity:  TD  Concentration:  Concentration: Fair and Attention Span: Fair  Recall:  [employer] of Knowledge:  Fair  Language:  Good  Akathisia:  Negative  Handed:  Right  AIMS (if indicated):     Assets:  Communication Skills Desire for Improvement Resilience Social Support  ADL's:  Intact  Cognition:  WNL  Sleep:        Treatment Plan Summary: Daily contact with patient to assess and evaluate symptoms and progress in treatment  Disposition: From a psychiatric standpoint, there is no evidence of imminent risk to self or others thus, patient does not meet criteria for psychiatric inpatient admission and is psychiatrically cleared. There are however concerns in regards to the significance of his alcohol use and current withdrawal state. I have started the Librium withdrawal protocol. Patient reported a history of seizures related to his alcohol use so it is important that he is monitored closely. Patient reported being homeless as another concern. He was unclear if he wanted to participate in a residential substance abuse program although I think this would be beneficial. Peer support consult placed to assist patient with resources. Although patient is psychiatrically cleared,  I am recommending that the withdrawal protocol is initiated and once withdrawal symptoms have subsided, and or improved, patient can be discharge with hopes to enter a substance abuse treatment program.      Anxiety- Patient endorsed ongoing anxiety. Ordered gabapentin 300 mg po TID for anxiety and also for alcohol use disorder.   Per review of chart, patient history includes HIV. Ordered  HIV medication listed in home medication list.   ED updated on reccommended plan of care at 11:09 am.   This service was provided via telemedicine using a 2-way, interactive audio and video technology.  Names of all persons participating in this  telemedicine service and their role in this encounter. Name: Maxen Rowland Role: Patient   Name: Denzil Magnuson  Role: PMHNP   Denzil Magnuson, NP 12/05/2020 11:02 AM

## 2020-12-05 NOTE — ED Notes (Signed)
Notified Dr. Wilkie Aye, patient in severe DTs at present. CIWA 38, 2mg  Ativan given Im, per VO. Dr. to bedside. Pt having difficulty with decision making. Pt agreeable to starting IV and staying for medical admission. Seizure precautions in place.

## 2020-12-06 DIAGNOSIS — F10239 Alcohol dependence with withdrawal, unspecified: Secondary | ICD-10-CM

## 2020-12-06 LAB — COMPREHENSIVE METABOLIC PANEL
ALT: 20 U/L (ref 0–44)
AST: 27 U/L (ref 15–41)
Albumin: 3.5 g/dL (ref 3.5–5.0)
Alkaline Phosphatase: 42 U/L (ref 38–126)
Anion gap: 14 (ref 5–15)
BUN: 10 mg/dL (ref 6–20)
CO2: 22 mmol/L (ref 22–32)
Calcium: 8.8 mg/dL — ABNORMAL LOW (ref 8.9–10.3)
Chloride: 102 mmol/L (ref 98–111)
Creatinine, Ser: 0.74 mg/dL (ref 0.61–1.24)
GFR, Estimated: >60 mL/min (ref >60)
Glucose, Bld: 67 mg/dL — ABNORMAL LOW (ref 70–99)
Potassium: 3.4 mmol/L — ABNORMAL LOW (ref 3.5–5.1)
Sodium: 138 mmol/L (ref 135–145)
Total Bilirubin: 1.8 mg/dL — ABNORMAL HIGH (ref 0.3–1.2)
Total Protein: 6.4 g/dL — ABNORMAL LOW (ref 6.5–8.1)

## 2020-12-06 LAB — RAPID URINE DRUG SCREEN, HOSP PERFORMED
Amphetamines: NOT DETECTED
Barbiturates: NOT DETECTED
Benzodiazepines: POSITIVE — AB
Cocaine: NOT DETECTED
Opiates: NOT DETECTED
Tetrahydrocannabinol: NOT DETECTED

## 2020-12-06 LAB — CK: Total CK: 219 U/L (ref 49–397)

## 2020-12-06 LAB — TSH: TSH: 1.879 u[IU]/mL (ref 0.350–4.500)

## 2020-12-06 LAB — CBC
HCT: 40.3 % (ref 39.0–52.0)
Hemoglobin: 13.6 g/dL (ref 13.0–17.0)
MCH: 32.9 pg (ref 26.0–34.0)
MCHC: 33.7 g/dL (ref 30.0–36.0)
MCV: 97.3 fL (ref 80.0–100.0)
Platelets: 140 10*3/uL — ABNORMAL LOW (ref 150–400)
RBC: 4.14 MIL/uL — ABNORMAL LOW (ref 4.22–5.81)
RDW: 12.9 % (ref 11.5–15.5)
WBC: 3.6 10*3/uL — ABNORMAL LOW (ref 4.0–10.5)
nRBC: 0 % (ref 0.0–0.2)

## 2020-12-06 LAB — GLUCOSE, CAPILLARY
Glucose-Capillary: 116 mg/dL — ABNORMAL HIGH (ref 70–99)
Glucose-Capillary: 99 mg/dL (ref 70–99)
Glucose-Capillary: 111 mg/dL — ABNORMAL HIGH (ref 70–99)

## 2020-12-06 LAB — HEPATITIS PANEL, ACUTE
HCV Ab: NONREACTIVE
Hep A IgM: NONREACTIVE
Hep B C IgM: NONREACTIVE
Hepatitis B Surface Ag: NONREACTIVE

## 2020-12-06 LAB — MAGNESIUM: Magnesium: 1.9 mg/dL (ref 1.7–2.4)

## 2020-12-06 LAB — PHOSPHORUS: Phosphorus: 4.1 mg/dL (ref 2.5–4.6)

## 2020-12-06 MED ORDER — DEXTROSE 50 % IV SOLN: 25.0000 mL | Freq: Once | INTRAVENOUS | Status: DC

## 2020-12-06 MED ORDER — LORAZEPAM 2 MG/ML IJ SOLN
2.0000 mg | Freq: Four times a day (QID) | INTRAMUSCULAR | Status: DC
  Filled 2020-12-06: qty 1

## 2020-12-06 MED ORDER — GABAPENTIN 300 MG PO CAPS
300.0000 mg | ORAL_CAPSULE | Freq: Three times a day (TID) | ORAL | Status: DC
  Administered 2020-12-07 (×3): 300 mg via ORAL
  Filled 2020-12-06 (×4): qty 1

## 2020-12-06 MED ORDER — LORAZEPAM 2 MG/ML IJ SOLN
2.0000 mg | Freq: Once | INTRAMUSCULAR | Status: AC
  Administered 2020-12-06: 2 mg via INTRAVENOUS
  Filled 2020-12-06: qty 1

## 2020-12-06 MED ORDER — KCL-LACTATED RINGERS 20 MEQ/L IV SOLN
INTRAVENOUS | Status: DC
  Filled 2020-12-06: qty 1000

## 2020-12-06 MED ORDER — CHLORHEXIDINE GLUCONATE CLOTH 2 % EX PADS
6.0000 | MEDICATED_PAD | Freq: Every day | CUTANEOUS | Status: DC
  Administered 2020-12-06: 6 via TOPICAL

## 2020-12-06 MED ORDER — LORAZEPAM 2 MG/ML IJ SOLN
2.0000 mg | Freq: Once | INTRAMUSCULAR | Status: AC
  Administered 2020-12-06: 2 mg via INTRAVENOUS

## 2020-12-06 MED ORDER — LORAZEPAM 2 MG/ML IJ SOLN: 2.0000 mg | Freq: Four times a day (QID) | INTRAMUSCULAR | Status: DC

## 2020-12-06 MED ORDER — LORAZEPAM 2 MG/ML IJ SOLN
1.0000 mg | INTRAMUSCULAR | Status: DC | PRN
  Administered 2020-12-06 – 2020-12-08 (×7): 2 mg via INTRAVENOUS
  Filled 2020-12-06 (×4): qty 1
  Filled 2020-12-06: qty 2

## 2020-12-06 MED ORDER — LORAZEPAM 1 MG PO TABS: 0.0000 mg | ORAL_TABLET | ORAL | Status: DC

## 2020-12-06 MED ORDER — DEXMEDETOMIDINE HCL IN NACL 400 MCG/100ML IV SOLN
0.2000 ug/kg/h | INTRAVENOUS | Status: DC
  Administered 2020-12-06: 0.7 ug/kg/h via INTRAVENOUS
  Administered 2020-12-07: 0.3 ug/kg/h via INTRAVENOUS
  Administered 2020-12-07: 0.4 ug/kg/h via INTRAVENOUS
  Filled 2020-12-06 (×3): qty 100

## 2020-12-06 MED ORDER — ELVITEG-COBIC-EMTRICIT-TENOFAF 150-150-200-10 MG PO TABS
1.0000 | ORAL_TABLET | Freq: Every day | ORAL | Status: DC
  Administered 2020-12-07 – 2020-12-08 (×2): 1 via ORAL
  Filled 2020-12-06 (×4): qty 1

## 2020-12-06 MED ORDER — LORAZEPAM 2 MG/ML IJ SOLN
0.0000 mg | INTRAMUSCULAR | Status: DC
  Administered 2020-12-06: 2 mg via INTRAVENOUS
  Filled 2020-12-06: qty 1
  Filled 2020-12-06: qty 2
  Filled 2020-12-06: qty 1

## 2020-12-06 MED ORDER — LORAZEPAM 2 MG/ML IJ SOLN
4.0000 mg | Freq: Once | INTRAMUSCULAR | Status: AC
  Administered 2020-12-06: 4 mg via INTRAVENOUS

## 2020-12-06 MED ORDER — POTASSIUM CHLORIDE 2 MEQ/ML IV SOLN
INTRAVENOUS | Status: DC
  Filled 2020-12-06 (×5): qty 1000

## 2020-12-06 MED ORDER — KCL-LACTATED RINGERS 20 MEQ/L IV SOLN: INTRAVENOUS | Status: DC

## 2020-12-06 MED ORDER — POTASSIUM CHLORIDE 2 MEQ/ML IV SOLN
INTRAVENOUS | Status: DC
  Filled 2020-12-06 (×3): qty 1000

## 2020-12-06 NOTE — Progress Notes (Addendum)
Family Medicine Teaching Service Daily Progress Note Intern Pager: [phone]  Patient name: Gary [name] Medical record number: [id number] Date of birth: [birth year] Age: 35 y.o. Gender: male  Primary Care Provider: Patient, No Pcp Per Consultants: None Code Status: Full  Pt Overview and Major Events to Date:  1/26- last drink  1/27- admitted  Assessment and Plan: Gary [name] is a 35 y.o. male presenting with acute alcohol withdrawal. PMH is significant for HIV and polysubstance abuse.  Acute Alcohol Withdrawal  Polysubstance Abuse Patient mildly tremulous but conversant and sitting up in bed during initial interview this morning. She was at that time were 2, 7. Stated he feels well aside from some mild nausea. Most recent CIWA 24.  - CIWA monitoring with Ativan q4 hours - Scheduled Ativan 2mg  q6 due to elevated CIWA's - NPO due to aspiration risk - Vitals per routine, cardiac monitoring - OOB with assistance only - Suicide precautions, 1:1 sitter - F/u urine drug screen results - Thiamine, folic acid, multivitamin - PT/OT eval and treat - Nicotine patch 21 mg  Hypokalemia K 3.4 today.  - LR + 20 mEq KCl, 100 mL/hr for 10hrs -A.m. BMP  HIV Takes Genvoya daily- unclear compliance. Most recent HIV RNA quant from April 2021 was <20. Hepatitis panel neg. - Restart Genvoya - HIV RNA quant pending   FEN/GI: NPO Prophylaxis: Lovenox   Status is: Inpatient  The patient will require care spanning > 2 midnights and should be moved to inpatient because: Altered mental status and Inpatient level of care appropriate due to severity of illness  Dispo: The patient is from: Home              Anticipated d/c is to: Home              Anticipated d/c date is: 3 days              Patient currently is not medically stable to d/c.   Difficult to place patient No   Subjective:  Gary [name] reports feeling "okay" this morning aside from some nausea and a mild UE tremor that  he thinks has been present for "a few hours."    Objective: Temp:  [97.8 F (36.6 C)-98.8 F (37.1 C)] 97.8 F (36.6 C) (01/28 0530) Pulse Rate:  [72-140] 84 (01/28 0530) Resp:  [15-26] 19 (01/28 0530) BP: (102-168)/(60-132) 112/87 (01/28 0530) SpO2:  [93 %-100 %] 100 % (01/28 0530) Physical Exam: General: Sitting up in bed, mildly sedated but conversing appropriately Cardiovascular: Tachycardic, regular rhythm,  Respiratory: Lungs CTAB, normal WOB on RA Abdomen: Soft, non-tender, non-distended, without ascites or organomegaly Extremities: UE with coarse tremor bilaterally  Laboratory: Recent Labs  Lab 12/04/20 1931 12/06/20 0527  WBC 7.6 3.6*  HGB 15.0 13.6  HCT 44.4 40.3  PLT 323 140*   Recent Labs  Lab 12/04/20 1931 12/06/20 0527  NA 142 138  K 4.0 3.4*  CL 105 102  CO2 25 22  BUN 5* 10  CREATININE 0.67 0.74  CALCIUM 9.7 8.8*  PROT 8.2* 6.4*  BILITOT 0.5 1.8*  ALKPHOS 51 42  ALT 26 20  AST 37 27  GLUCOSE 89 67*   Imaging/Diagnostic Tests: No new imaging/tests  12/08/20, Medical Student 12/06/2020, 6:39 AM   Resident attestation: I agree with the documentation of Student Dr. 12/08/2020 above. I have made adjustments to his note as appropriate. I have seen the patient and performed physical exam on the patient consistent with  his documented physical exam above.  Closely monitoring patient's CIWA's. I expect that he will have his most difficulty later today through tonight. Have scheduled Ativan in addition to Ativan per CIWA protocol. Will follow closely.  Jackelyn Poling, DO

## 2020-12-06 NOTE — Progress Notes (Signed)
Patient refusing to let us fully assess him on admission to the ICU. Insisted that he stay in the behavioral ED scrubs. This RN was not able to fully assess his body for skin breakdown. Patient did refuse to turn over for me to look at his bottom. This RN did notice red spots on bilateral feet not sure if they were blanchable because patient was refusing to be touched at this time.

## 2020-12-06 NOTE — ED Notes (Signed)
Pt is requesting not to have anything PO at this time.

## 2020-12-06 NOTE — ED Notes (Signed)
PT had 4 mg of ativan as of 11/12. MD Welborn notified. PT total ativan dosage is 5 mg since 8 am. MAR is not reflecting accurate dosage given.

## 2020-12-06 NOTE — ED Notes (Signed)
Dinner Trays Ordered @ 1635. 

## 2020-12-06 NOTE — Progress Notes (Signed)
PT Cancellation Note  Patient Details Name: Gary [name] MRN: [id number] DOB: 1986/06/09   Cancelled Treatment:    Reason Eval/Treat Not Completed: Fatigue/lethargy limiting ability to participate RN reporting pt just received ativan and is too lethargic to participate. Will hold until pt medically appropriate and follow up as schedule allows.   Cindee Salt, DPT  Acute Rehabilitation Services  Pager: [phone] Office: [phone]    Gary [name] 12/06/2020, 11:56 AM

## 2020-12-06 NOTE — ED Notes (Signed)
Per MD Welborn patient to receive another 2 mg of ativan. This will bring pt total to 6mg  as of 8 AM. RN to reassess in an hour.

## 2020-12-06 NOTE — ED Notes (Signed)
Pt c/o pain at PIV site. Pt refused to have medication through IV. No redness or swelling noted at PIV site. Pt refuses for this nurse to attempt another PIV, pt is requesting IV team. IV team consult ordered. Attempted to call report, RN unavailable, will call back.

## 2020-12-06 NOTE — ED Notes (Signed)
Pt c/o of increased anxiety and restlessness. MD Welborn notified.

## 2020-12-06 NOTE — Consult Note (Addendum)
NAME:  Gary [name], MRN:  [id number], DOB:  15-Nov-1985, LOS: 0 ADMISSION DATE:  12/04/2020, CONSULTATION DATE:  1/28 REFERRING MD:  Dr. Leveda Anna, CHIEF COMPLAINT:  ETOH Withdrawal   Brief History:  35 y/o M with PMH of HIV, ETOH abuse admitted 1/26 per FPTS with reports of suicidal ideations, ETOH withdrawal.    History of Present Illness:  35 y/o M who presented to [facility] on 1/26 with suicidal ideations and ETOH withdrawal.  The patient reported on presentation that he was suicidal.  He was placed under IVC.  On presentation, the patients ETOH level was 433.  He reported drinking 12-15 beers per day and liquor.  He notes a hx of seizures, blackouts and hallucinations due to ETOH abuse in the past.  He has previously participated in rehabilitation programs.  He is apparently homeless. He has struggled with anxiety in th past and has never attempted suicide.  The patient was seen by Psychiatry and cleared of suicidal / homicidal ideations - he was deemed not to need inpatient behavioral health.     He was admitted by FPTS.  Initial labs on admit ETOH 433, salicylate level <7, acetaminophen level <10.  UDS positive for benzo's.  BMP / CBC within normal limits on admit.  He was started on the CIWA protocol and on 1/28 was documented to have received 6mg  IV ativan but on exam, the patient's IV was in the floor.  Unclear if he actually received medications.   PCCM consulted for elevated CIWA protocol.   Past Medical History:  HIV  Homeless Heavy ETOH Abuse Meth Abuse   Significant Hospital Events:  1/26 Presented to [facility] with SI, ETOH Abuse   1/28 PCCM consulted   Consults:  Psychiatry   Procedures:    Significant Diagnostic Tests:    Micro Data:  COVID 1/26 >> negative   Antimicrobials:     Interim History / Subjective:  Afebrile  VSS  Pt reports he is nauseated, wants a drink   Objective   Blood pressure (!) 130/91, pulse 93, temperature 97.8 F (36.6 C), temperature source  Oral, resp. rate (!) 22, SpO2 99 %.        Intake/Output Summary (Last 24 hours) at 12/06/2020 1431 Last data filed at 12/06/2020 0914 Gross per 24 hour  Intake 1048.33 ml  Output --  Net 1048.33 ml   There were no vitals filed for this visit.  Examination: General: adult male sitting up in bed in NAD PSY: flat affect, poor eye contact  HENT: MM pink/dry, no jvd, anicteric Lungs: non-labored on RA, lungs bilaterally clear with good air entry  Cardiovascular: s1s2 rrr, no m/r/g Abdomen: flat, bsx4 active Extremities: wearing clothes / covered, no rashes or lesions on exposed skin  Neuro: AAOx4, currently calm, MAE, normal strength  Resolved Hospital Problem list     Assessment & Plan:   ETOH Abuse with Withdrawal -transfer to ICU given CIWA and risk of worsening withdrawal -continue CIWA protocol  -ativan 1-4 mg Q4 PRN CIWA >8  -monitor for worsening withdrawal symptoms  -ensure IV is attached to patient for medication administration  -MVI, folate, thiamine  -LR with 12/08/2020 KCL at 100 ml/hr -PPI  Depression  Initially expressed suicidal ideations on admit. Seen by psychiatry and cleared for inpatient needs 1/27.  -supportive care  -med review shows he was not taking prozac -? IVC at this point if cleared by PSY, ? Involvement of a brother    HIV  -continue home  Genyoya  Hypoglycemia  -1/2 amp dextrose now  Tobacco Abuse -smoking cessation counseling when able -nicotine patch   Nausea  At Risk Malnutrition  -diet as tolerated   Best practice (evaluated daily)  Diet: Diet as tolerated Pain/Anxiety/Delirium protocol (if indicated): CIWA protocol  VAP protocol (if indicated): n/a  DVT prophylaxis: lovenox  GI prophylaxis: PPI  Glucose control:  Mobility: As tolerated  Disposition: ICU   Goals of Care:  Last date of multidisciplinary goals of care discussion: Family and staff present:  Summary of discussion:  Follow up goals of care discussion due:   Code Status: Full Code  Labs   CBC: Recent Labs  Lab 12/04/20 1931 12/06/20 0527  WBC 7.6 3.6*  HGB 15.0 13.6  HCT 44.4 40.3  MCV 95.1 97.3  PLT 323 140*    Basic Metabolic Panel: Recent Labs  Lab 12/04/20 1931 12/05/20 2217 12/06/20 0527  NA 142  --  138  K 4.0  --  3.4*  CL 105  --  102  CO2 25  --  22  GLUCOSE 89  --  67*  BUN 5*  --  10  CREATININE 0.67  --  0.74  CALCIUM 9.7  --  8.8*  MG  --  1.9  --   PHOS  --  4.1  --    GFR: CrCl cannot be calculated (Unknown ideal weight.). Recent Labs  Lab 12/04/20 1931 12/06/20 0527  WBC 7.6 3.6*    Liver Function Tests: Recent Labs  Lab 12/04/20 1931 12/06/20 0527  AST 37 27  ALT 26 20  ALKPHOS 51 42  BILITOT 0.5 1.8*  PROT 8.2* 6.4*  ALBUMIN 4.5 3.5   No results for input(s): LIPASE, AMYLASE in the last 168 hours. No results for input(s): AMMONIA in the last 168 hours.  ABG No results found for: PHART, PCO2ART, PO2ART, HCO3, TCO2, ACIDBASEDEF, O2SAT   Coagulation Profile: No results for input(s): INR, PROTIME in the last 168 hours.  Cardiac Enzymes: Recent Labs  Lab 12/05/20 2202  CKTOTAL 219    HbA1C: Hgb A1c MFr Bld  Date/Time Value Ref Range Status  09/24/2016 04:54 AM 5.3 4.8 - 5.6 % Final    Comment:    (NOTE)         Pre-diabetes: 5.7 - 6.4         Diabetes: >6.4         Glycemic control for adults with diabetes: <7.0     CBG: No results for input(s): GLUCAP in the last 168 hours.  Review of Systems: positives in bold  Gen: Denies fever, chills, weight change, fatigue, night sweats HEENT: Denies blurred vision, double vision, hearing loss, tinnitus, sinus congestion, rhinorrhea, sore throat, neck stiffness, dysphagia PULM: Denies shortness of breath, cough, sputum production, hemoptysis, wheezing CV: Denies chest pain, edema, orthopnea, paroxysmal nocturnal dyspnea, palpitations GI: Denies abdominal pain, nausea, vomiting, diarrhea, hematochezia, melena, constipation,  change in bowel habits GU: Denies dysuria, hematuria, polyuria, oliguria, urethral discharge Endocrine: Denies hot or cold intolerance, polyuria, polyphagia or appetite change Derm: Denies rash, dry skin, scaling or peeling skin change Heme: Denies easy bruising, bleeding, bleeding gums Neuro: Denies headache, numbness, weakness, slurred speech, loss of memory or consciousness   Past Medical History:  He,  has a past medical history of Alcohol abuse, HIV (human immunodeficiency virus infection) (HCC), and Subdural hematoma (HCC).   Surgical History:   Past Surgical History:  Procedure Laterality Date  . INCISION AND DRAINAGE PERIRECTAL ABSCESS N/A  09/24/2016   Procedure: IRRIGATION AND DEBRIDEMENT PERIRECTAL ABSCESS;  Surgeon: Ricarda Frame, MD;  Location: [facility] ORS;  Service: General;  Laterality: N/A;  . none       Social History:   reports that he has quit smoking. His smoking use included cigarettes. He smoked 0.50 packs per day. He has never used smokeless tobacco. He reports current alcohol use. He reports previous drug use. Drugs: Marijuana and Methamphetamines.   Family History:  His family history includes Alcohol abuse in his father and mother.   Allergies Allergies  Allergen Reactions  . Tegretol [Carbamazepine] Other (See Comments)    Caused vertigo for 2 days after taking it  . Caffeine Palpitations     Home Medications  Prior to Admission medications   Medication Sig Start Date End Date Taking? Authorizing Provider  elvitegravir-cobicistat-emtricitabine-tenofovir (GENVOYA) 150-150-200-10 MG TABS tablet Take 1 tablet by mouth daily with breakfast. 09/28/20  Yes Clapacs, Jackquline Denmark, MD  nicotine (NICODERM CQ - DOSED IN MG/24 HOURS) 21 mg/24hr patch Place 1 patch (21 mg total) onto the skin daily. 05/09/20  Yes Burnadette Pop, MD  FLUoxetine (PROZAC) 20 MG capsule Take 1 capsule (20 mg total) by mouth daily. Patient not taking: Reported on 12/05/2020 09/27/20 10/27/20   Clapacs, Jackquline Denmark, MD  pantoprazole (PROTONIX) 40 MG tablet Take 1 tablet (40 mg total) by mouth daily. Patient not taking: No sig reported 09/27/20   Clapacs, Jackquline Denmark, MD  famotidine (PEPCID) 20 MG tablet Take 1 tablet (20 mg total) by mouth 2 (two) times daily. Patient not taking: Reported on 06/01/2019 05/14/19 06/02/19  Benjiman Core, MD     Critical care time: 30 minutes     Canary Brim, MSN, NP-C, AGACNP-BC [facility] 12/06/2020, 2:31 PM   Please see [email or web address] for pager details.

## 2020-12-07 LAB — BASIC METABOLIC PANEL
Anion gap: 8 (ref 5–15)
BUN: 5 mg/dL — ABNORMAL LOW (ref 6–20)
CO2: 25 mmol/L (ref 22–32)
Calcium: 9.1 mg/dL (ref 8.9–10.3)
Chloride: 104 mmol/L (ref 98–111)
Creatinine, Ser: 0.72 mg/dL (ref 0.61–1.24)
GFR, Estimated: >60 mL/min (ref >60)
Glucose, Bld: 99 mg/dL (ref 70–99)
Potassium: 3.9 mmol/L (ref 3.5–5.1)
Sodium: 137 mmol/L (ref 135–145)

## 2020-12-07 LAB — CBC
HCT: 39.7 % (ref 39.0–52.0)
Hemoglobin: 13.1 g/dL (ref 13.0–17.0)
MCH: 32.3 pg (ref 26.0–34.0)
MCHC: 33 g/dL (ref 30.0–36.0)
MCV: 98 fL (ref 80.0–100.0)
Platelets: 145 10*3/uL — ABNORMAL LOW (ref 150–400)
RBC: 4.05 MIL/uL — ABNORMAL LOW (ref 4.22–5.81)
RDW: 12.5 % (ref 11.5–15.5)
WBC: 3 10*3/uL — ABNORMAL LOW (ref 4.0–10.5)
nRBC: 0 % (ref 0.0–0.2)

## 2020-12-07 LAB — GLUCOSE, CAPILLARY
Glucose-Capillary: 92 mg/dL (ref 70–99)
Glucose-Capillary: 76 mg/dL (ref 70–99)
Glucose-Capillary: 95 mg/dL (ref 70–99)
Glucose-Capillary: 128 mg/dL — ABNORMAL HIGH (ref 70–99)
Glucose-Capillary: 124 mg/dL — ABNORMAL HIGH (ref 70–99)

## 2020-12-07 LAB — HIV-1 RNA QUANT-NO REFLEX-BLD
HIV 1 RNA Quant: 20 copies/mL
LOG10 HIV-1 RNA: 1.301 log10copy/mL

## 2020-12-07 LAB — PHOSPHORUS: Phosphorus: 3.2 mg/dL (ref 2.5–4.6)

## 2020-12-07 LAB — MRSA PCR SCREENING: MRSA by PCR: NEGATIVE

## 2020-12-07 LAB — MAGNESIUM: Magnesium: 1.8 mg/dL (ref 1.7–2.4)

## 2020-12-07 MED ORDER — FOLIC ACID 1 MG PO TABS
1.0000 mg | ORAL_TABLET | Freq: Every day | ORAL | Status: DC
  Administered 2020-12-07 – 2020-12-08 (×2): 1 mg via ORAL
  Filled 2020-12-07 (×2): qty 1

## 2020-12-07 MED ORDER — CHLORDIAZEPOXIDE HCL 25 MG PO CAPS
25.0000 mg | ORAL_CAPSULE | Freq: Three times a day (TID) | ORAL | Status: DC
  Administered 2020-12-07: 25 mg via ORAL
  Filled 2020-12-07: qty 1

## 2020-12-07 MED ORDER — RIVAROXABAN 10 MG PO TABS
10.0000 mg | ORAL_TABLET | Freq: Every day | ORAL | Status: DC
  Administered 2020-12-07: 10 mg via ORAL
  Filled 2020-12-07 (×2): qty 1

## 2020-12-07 NOTE — Progress Notes (Signed)
NAME:  Gary [name], MRN:  [id number], DOB:  1985/12/02, LOS: 1 ADMISSION DATE:  12/04/2020, CONSULTATION DATE:  1/28 REFERRING MD:  Dr. Leveda Anna, CHIEF COMPLAINT:  ETOH Withdrawal   Brief History:  35 yo homeless male smoker with hx of ETOH presented to ED on 1/26 with suicidal ideation.  ETOH level on admission 433.  Evaluated by psychiatry and felt to not be risk for self harm.  Developed alcohol withdrawal while waiting in ER for bed placement.  Required precedex and admitted to ICU.Marland Kitchen    Past Medical History:  HIV, ETOH, Meth abuse  Significant Hospital Events:  1/26 Presented to [facility] with SI, ETOH Abuse   1/28 PCCM consulted   Consults:  Psychiatry   Procedures:    Significant Diagnostic Tests:    Micro Data:  COVID 1/26 >> negative   Antimicrobials:     Interim History / Subjective:  Hurts everywhere.  Denies nausea.  Breathing okay.  Objective   Blood pressure (!) 86/62, pulse (!) 56, temperature 97.6 F (36.4 C), temperature source Oral, resp. rate 15, weight 72 kg, SpO2 95 %.        Intake/Output Summary (Last 24 hours) at 12/07/2020 0735 Last data filed at 12/07/2020 0700 Gross per 24 hour  Intake 1845.9 ml  Output 400 ml  Net 1445.9 ml   Filed Weights   12/06/20 1700  Weight: 72 kg    Examination:  General - alert Eyes - pupils reactive ENT - no sinus tenderness, no stridor Cardiac - bradycardic, no murmur Chest - equal breath sounds b/l, no wheezing or rales Abdomen - soft, non tender, + bowel sounds Extremities - no cyanosis, clubbing, or edema Skin - no rashes Neuro - normal strength, moves extremities, follows commands Psych - flat affect  Resolved Hospital Problem list   Hypoglycemia  Assessment & Plan:   Acute alcohol withdrawal. - precedex for RASS goal 0 - add scheduled librium - prn ativan for CIWA > 8 - continue IV fluids - f/u electrolytes  Depression.  Neuropathy. - Initially expressed suicidal ideations on admit. Seen  by psychiatry and cleared for inpatient needs 1/27.  - continue neurontin  HIV.  -.continue home Genyoya  Tobacco Abuse - nicotine patch   Best practice (evaluated daily)  Diet: regular DVT prophylaxis: lovenox  GI prophylaxis: protonix Mobility: OOB to chair Disposition: ICU   Goals of Care:  Last date of multidisciplinary goals of care discussion: Family and staff present:  Summary of discussion:  Follow up goals of care discussion due:  Code Status: Full Code  Labs    CMP Latest Ref Rng & Units 12/06/2020 12/04/2020 10/20/2020  Glucose 70 - 99 mg/dL 26(S) 89 80  BUN 6 - 20 mg/dL 10 5(L) 8  Creatinine 3.41 - 1.24 mg/dL 9.62 2.29 7.98  Sodium 135 - 145 mmol/L 138 142 134(L)  Potassium 3.5 - 5.1 mmol/L 3.4(L) 4.0 3.5  Chloride 98 - 111 mmol/L 102 105 99  CO2 22 - 32 mmol/L 22 25 24   Calcium 8.9 - 10.3 mg/dL ) 9.7 9.2(J)  Total Protein 6.5 - 8.1 g/dL 6.4(L) 8.2(H) 7.8  Total Bilirubin 0.3 - 1.2 mg/dL 1.9(E) 0.5 1.0  Alkaline Phos 38 - 126 U/L 42 51 91  AST 15 - 41 U/L 27 37 40  ALT 0 - 44 U/L 20 26 29     CBC Latest Ref Rng & Units 12/06/2020 12/04/2020 10/20/2020  WBC 4.0 - 10.5 K/uL 3.6(L) 7.6 4.4  Hemoglobin 13.0 -  17.0 g/dL 68.2 57.4 93.5  Hematocrit 39.0 - 52.0 % 40.3 44.4 39.4  Platelets 150 - 400 K/uL 140(L) 323 121(L)    CBG (last 3)  Recent Labs    12/06/20 2318 12/07/20 0332 12/07/20 0736  GLUCAP 111* 92 76    Critical care time: 31 minutes  Coralyn Helling, MD [place] [facility] Pager - [phone] 12/07/2020, 7:41 AM

## 2020-12-08 ENCOUNTER — Ambulatory Visit (HOSPITAL_COMMUNITY)
Admission: EM | Admit: 2020-12-08 | Discharge: 2020-12-08 | Disposition: A | Discharge status: Home / Self Care | Payer: Self-pay

## 2020-12-08 LAB — BASIC METABOLIC PANEL
Anion gap: 7 (ref 5–15)
BUN: <5 mg/dL — ABNORMAL LOW (ref 6–20)
CO2: 24 mmol/L (ref 22–32)
Calcium: 9.2 mg/dL (ref 8.9–10.3)
Chloride: 105 mmol/L (ref 98–111)
Creatinine, Ser: 0.67 mg/dL (ref 0.61–1.24)
GFR, Estimated: >60 mL/min (ref >60)
Glucose, Bld: 100 mg/dL — ABNORMAL HIGH (ref 70–99)
Potassium: 3.9 mmol/L (ref 3.5–5.1)
Sodium: 136 mmol/L (ref 135–145)

## 2020-12-08 LAB — MAGNESIUM: Magnesium: 1.6 mg/dL — ABNORMAL LOW (ref 1.7–2.4)

## 2020-12-08 MED ORDER — ACETAMINOPHEN 325 MG PO TABS: 650.0000 mg | ORAL_TABLET | Freq: Four times a day (QID) | ORAL | Status: DC | PRN

## 2020-12-08 MED ORDER — MAGNESIUM SULFATE 4 GM/100ML IV SOLN
4.0000 g | Freq: Once | INTRAVENOUS | Status: AC
  Administered 2020-12-08: 4 g via INTRAVENOUS
  Filled 2020-12-08: qty 100

## 2020-12-08 MED ORDER — NICOTINE 14 MG/24HR TD PT24: 14.0000 mg | MEDICATED_PATCH | Freq: Every day | TRANSDERMAL | Status: DC

## 2020-12-08 MED ORDER — OXYCODONE HCL 5 MG PO TABS: 5.0000 mg | ORAL_TABLET | ORAL | Status: DC | PRN

## 2020-12-08 NOTE — Progress Notes (Signed)
NAME:  Gary [name], MRN:  [id number], DOB:  [birth year], LOS: 2 ADMISSION DATE:  12/04/2020, CONSULTATION DATE:  1/28 REFERRING MD:  Dr. Leveda Anna, CHIEF COMPLAINT:  ETOH Withdrawal   Brief History:  34 yo homeless male smoker with hx of ETOH presented to ED on 1/26 with suicidal ideation.  ETOH level on admission 433.  Evaluated by psychiatry and felt to not be risk for self harm.  Developed alcohol withdrawal while waiting in ER for bed placement.  Required precedex and admitted to ICU.Marland Kitchen    Past Medical History:  HIV, ETOH, Meth abuse  Significant Hospital Events:  1/26 Presented to [facility] with SI, ETOH Abuse   1/28 PCCM consulted   Consults:  Psychiatry   Procedures:    Significant Diagnostic Tests:    Micro Data:  COVID 1/26 >> negative   Antimicrobials:     Interim History / Subjective:  Still feels achy.  Not as anxious or jittery.  Down to 2 mcg of precedex.  Objective   Blood pressure (!) 86/60, pulse (!) 58, temperature 97.8 F (36.6 C), temperature source Oral, resp. rate 15, weight 72 kg, SpO2 100 %.        Intake/Output Summary (Last 24 hours) at 12/08/2020 8295 Last data filed at 12/08/2020 0600 Gross per 24 hour  Intake 2930.03 ml  Output 2850 ml  Net 80.03 ml   Filed Weights   12/06/20 1700  Weight: 72 kg    Examination:  General - alert Eyes - pupils reactive ENT - no sinus tenderness, no stridor Cardiac - regular rate/rhythm, no murmur Chest - equal breath sounds b/l, no wheezing or rales Abdomen - soft, non tender, + bowel sounds Extremities - no cyanosis, clubbing, or edema Skin - no rashes Neuro - normal strength, moves extremities, follows commands Psych - flat affect   Resolved Hospital Problem list   Hypoglycemia  Assessment & Plan:   Acute alcohol withdrawal. - wean precedex for RASS goal 0 - continue scheduled librium - prn ativan for CIWA > 8 - continue IV fluids  DVT prophylaxis. - he declined lovenox injections -  using low dose xarelto  Hypomagnesemia. - f/u electrolytes  Depression.  Neuropathy. - Initially expressed suicidal ideations on admit. Seen by psychiatry and cleared for inpatient needs 1/27.  - continue neurontin - will need f/u with psychiatry when medically stable  HIV.  -.continue home Genyoya  Tobacco Abuse - nicotine patch  Homeless. - social work consulted  Medical sales representative (evaluated daily)  Diet: regular DVT prophylaxis: xarelto GI prophylaxis: protonix Mobility: OOB to chair Disposition: can transfer out of ICU once he is off precedex   Goals of Care:  Last date of multidisciplinary goals of care discussion: Family and staff present:  Summary of discussion:  Follow up goals of care discussion due:  Code Status: Full Code  Labs    CMP Latest Ref Rng & Units 12/08/2020 12/07/2020 12/06/2020  Glucose 70 - 99 mg/dL 621(H) 99 08(M)  BUN 6 - 20 mg/dL <5(H) 5(L) 10  Creatinine 0.61 - 1.24 mg/dL 8.46 9.62 9.52  Sodium 135 - 145 mmol/L 136 137 138  Potassium 3.5 - 5.1 mmol/L 3.9 3.9 3.4(L)  Chloride 98 - 111 mmol/L 105 104 102  CO2 22 - 32 mmol/L 24 25 22   Calcium 8.9 - 10.3 mg/dL 9.2 9.1 )  Total Protein 6.5 - 8.1 g/dL - - 6.4(L)  Total Bilirubin 0.3 - 1.2 mg/dL - - 1.8(H)  Alkaline Phos 38 -  126 U/L - - 42  AST 15 - 41 U/L - - 27  ALT 0 - 44 U/L - - 20    CBC Latest Ref Rng & Units 12/07/2020 12/06/2020 12/04/2020  WBC 4.0 - 10.5 K/uL 3.0(L) 3.6(L) 7.6  Hemoglobin 13.0 - 17.0 g/dL 92.9 24.4 62.8  Hematocrit 39.0 - 52.0 % 39.7 40.3 44.4  Platelets 150 - 400 K/uL 145(L) 140(L) 323    CBG (last 3)  Recent Labs    12/07/20 1142 12/07/20 1605 12/07/20 1926  GLUCAP 95 128* 124*    Signature:  Coralyn Helling, MD [facility] Pager - [phone] 12/08/2020, 8:37 AM

## 2020-12-08 NOTE — Progress Notes (Signed)
Pt's AM Magnesium level 1.6 with creat 0.67 and GFR > 60. ELink eCCM electrolyte protocol initiated.

## 2020-12-08 NOTE — ED Triage Notes (Signed)
Pt needs refill on genvoya

## 2020-12-08 NOTE — Progress Notes (Signed)
Pt stated he is feeling better and wanted to leave the hospital, Pt explained the important for him to stay and be clear by physician before he left. Pt stated he was ready to go and he did not want to be held against his will. Dr. Craige Cotta was notified and Pt signed AMA paper.

## 2020-12-08 NOTE — Progress Notes (Signed)
Pt's IV removed and pt's belonging returned to pt.

## 2020-12-09 ENCOUNTER — Observation Stay (HOSPITAL_COMMUNITY)
Admission: EM | Admit: 2020-12-09 | Discharge: 2020-12-09 | Disposition: A | Discharge status: Home / Self Care | Payer: Self-pay | Attending: Family Medicine | Admitting: Family Medicine

## 2020-12-09 ENCOUNTER — Encounter (HOSPITAL_COMMUNITY): Payer: Self-pay

## 2020-12-09 DIAGNOSIS — Z21 Asymptomatic human immunodeficiency virus [HIV] infection status: Secondary | ICD-10-CM | POA: Diagnosis present

## 2020-12-09 DIAGNOSIS — Z20822 Contact with and (suspected) exposure to covid-19: Secondary | ICD-10-CM | POA: Insufficient documentation

## 2020-12-09 DIAGNOSIS — F1910 Other psychoactive substance abuse, uncomplicated: Secondary | ICD-10-CM | POA: Insufficient documentation

## 2020-12-09 DIAGNOSIS — F10939 Alcohol use, unspecified with withdrawal, unspecified: Secondary | ICD-10-CM

## 2020-12-09 DIAGNOSIS — B20 Human immunodeficiency virus [HIV] disease: Secondary | ICD-10-CM | POA: Diagnosis present

## 2020-12-09 DIAGNOSIS — Z87891 Personal history of nicotine dependence: Secondary | ICD-10-CM | POA: Insufficient documentation

## 2020-12-09 DIAGNOSIS — F10139 Alcohol abuse with withdrawal, unspecified: Secondary | ICD-10-CM | POA: Diagnosis present

## 2020-12-09 DIAGNOSIS — F10239 Alcohol dependence with withdrawal, unspecified: Principal | ICD-10-CM | POA: Insufficient documentation

## 2020-12-09 LAB — CBC WITH DIFFERENTIAL/PLATELET
Abs Immature Granulocytes: 0.01 10*3/uL (ref 0.00–0.07)
Basophils Absolute: 0 10*3/uL (ref 0.0–0.1)
Basophils Relative: 0 %
Eosinophils Absolute: 0.1 10*3/uL (ref 0.0–0.5)
Eosinophils Relative: 2 %
HCT: 44.5 % (ref 39.0–52.0)
Hemoglobin: 15.2 g/dL (ref 13.0–17.0)
Immature Granulocytes: 0 %
Lymphocytes Relative: 28 %
Lymphs Abs: 2.1 10*3/uL (ref 0.7–4.0)
MCH: 33.2 pg (ref 26.0–34.0)
MCHC: 34.2 g/dL (ref 30.0–36.0)
MCV: 97.2 fL (ref 80.0–100.0)
Monocytes Absolute: 0.4 10*3/uL (ref 0.1–1.0)
Monocytes Relative: 5 %
Neutro Abs: 4.8 10*3/uL (ref 1.7–7.7)
Neutrophils Relative %: 65 %
Platelets: 210 10*3/uL (ref 150–400)
RBC: 4.58 MIL/uL (ref 4.22–5.81)
RDW: 13.2 % (ref 11.5–15.5)
WBC: 7.4 10*3/uL (ref 4.0–10.5)
nRBC: 0 % (ref 0.0–0.2)

## 2020-12-09 LAB — COMPREHENSIVE METABOLIC PANEL
ALT: 35 U/L (ref 0–44)
AST: 50 U/L — ABNORMAL HIGH (ref 15–41)
Albumin: 4.6 g/dL (ref 3.5–5.0)
Alkaline Phosphatase: 47 U/L (ref 38–126)
Anion gap: 14 (ref 5–15)
BUN: 6 mg/dL (ref 6–20)
CO2: 23 mmol/L (ref 22–32)
Calcium: 9.8 mg/dL (ref 8.9–10.3)
Chloride: 103 mmol/L (ref 98–111)
Creatinine, Ser: 0.8 mg/dL (ref 0.61–1.24)
GFR, Estimated: >60 mL/min (ref >60)
Glucose, Bld: 97 mg/dL (ref 70–99)
Potassium: 4.3 mmol/L (ref 3.5–5.1)
Sodium: 140 mmol/L (ref 135–145)
Total Bilirubin: 0.5 mg/dL (ref 0.3–1.2)
Total Protein: 8.5 g/dL — ABNORMAL HIGH (ref 6.5–8.1)

## 2020-12-09 LAB — SARS CORONAVIRUS 2 (TAT 6-24 HRS): SARS Coronavirus 2: NEGATIVE

## 2020-12-09 LAB — RAPID URINE DRUG SCREEN, HOSP PERFORMED
Amphetamines: NOT DETECTED
Barbiturates: NOT DETECTED
Benzodiazepines: POSITIVE — AB
Cocaine: NOT DETECTED
Opiates: NOT DETECTED
Tetrahydrocannabinol: NOT DETECTED

## 2020-12-09 LAB — SALICYLATE LEVEL: Salicylate Lvl: <7 mg/dL — ABNORMAL LOW (ref 7.0–30.0)

## 2020-12-09 LAB — ACETAMINOPHEN LEVEL: Acetaminophen (Tylenol), Serum: <10 ug/mL — ABNORMAL LOW (ref 10–30)

## 2020-12-09 LAB — CBG MONITORING, ED: Glucose-Capillary: 90 mg/dL (ref 70–99)

## 2020-12-09 LAB — ETHANOL: Alcohol, Ethyl (B): 231 mg/dL — ABNORMAL HIGH (ref <10)

## 2020-12-09 MED ORDER — RIVAROXABAN 10 MG PO TABS: 10.0000 mg | ORAL_TABLET | Freq: Every day | ORAL | Status: DC

## 2020-12-09 MED ORDER — THIAMINE HCL 100 MG/ML IJ SOLN
100.0000 mg | Freq: Every day | INTRAMUSCULAR | Status: DC
  Filled 2020-12-09: qty 2

## 2020-12-09 MED ORDER — THIAMINE HCL 100 MG/ML IJ SOLN
100.0000 mg | Freq: Every day | INTRAMUSCULAR | Status: DC
  Administered 2020-12-09: 100 mg via INTRAVENOUS
  Filled 2020-12-09: qty 2

## 2020-12-09 MED ORDER — LORAZEPAM 1 MG PO TABS
0.0000 mg | ORAL_TABLET | Freq: Four times a day (QID) | ORAL | Status: DC
  Administered 2020-12-09: 2 mg via ORAL
  Filled 2020-12-09: qty 2

## 2020-12-09 MED ORDER — LORAZEPAM 1 MG PO TABS: 0.0000 mg | ORAL_TABLET | Freq: Two times a day (BID) | ORAL | Status: DC

## 2020-12-09 MED ORDER — LORAZEPAM 1 MG PO TABS
0.0000 mg | ORAL_TABLET | Freq: Four times a day (QID) | ORAL | Status: DC
  Filled 2020-12-09: qty 2

## 2020-12-09 MED ORDER — NICOTINE 21 MG/24HR TD PT24
21.0000 mg | MEDICATED_PATCH | Freq: Once | TRANSDERMAL | Status: DC
  Administered 2020-12-09: 21 mg via TRANSDERMAL
  Filled 2020-12-09: qty 1

## 2020-12-09 MED ORDER — FOLIC ACID 1 MG PO TABS
1.0000 mg | ORAL_TABLET | Freq: Every day | ORAL | Status: DC
  Administered 2020-12-09: 1 mg via ORAL
  Filled 2020-12-09: qty 1

## 2020-12-09 MED ORDER — THIAMINE HCL 100 MG PO TABS: 100.0000 mg | ORAL_TABLET | Freq: Every day | ORAL | Status: DC

## 2020-12-09 MED ORDER — ELVITEG-COBIC-EMTRICIT-TENOFAF 150-150-200-10 MG PO TABS
1.0000 | ORAL_TABLET | Freq: Every day | ORAL | Status: DC
  Administered 2020-12-09: 1 via ORAL
  Filled 2020-12-09: qty 1

## 2020-12-09 MED ORDER — ENOXAPARIN SODIUM 40 MG/0.4ML ~~LOC~~ SOLN
40.0000 mg | SUBCUTANEOUS | Status: DC
  Administered 2020-12-09: 40 mg via SUBCUTANEOUS
  Filled 2020-12-09: qty 0.4

## 2020-12-09 MED ORDER — LORAZEPAM 2 MG/ML IJ SOLN: 0.0000 mg | Freq: Two times a day (BID) | INTRAMUSCULAR | Status: DC

## 2020-12-09 MED ORDER — LORAZEPAM 1 MG PO TABS: 1.0000 mg | ORAL_TABLET | ORAL | Status: DC | PRN

## 2020-12-09 MED ORDER — LORAZEPAM 2 MG/ML IJ SOLN
1.0000 mg | INTRAMUSCULAR | Status: DC | PRN
  Administered 2020-12-09 (×2): 2 mg via INTRAVENOUS
  Filled 2020-12-09 (×2): qty 1

## 2020-12-09 MED ORDER — LORAZEPAM 2 MG/ML IJ SOLN
2.0000 mg | Freq: Once | INTRAMUSCULAR | Status: AC
  Administered 2020-12-09: 2 mg via INTRAVENOUS

## 2020-12-09 MED ORDER — ADULT MULTIVITAMIN W/MINERALS CH
1.0000 | ORAL_TABLET | Freq: Every day | ORAL | Status: DC
  Administered 2020-12-09: 1 via ORAL
  Filled 2020-12-09: qty 1

## 2020-12-09 MED ORDER — LORAZEPAM 2 MG/ML IJ SOLN
0.0000 mg | Freq: Four times a day (QID) | INTRAMUSCULAR | Status: DC
  Administered 2020-12-09: 2 mg via INTRAVENOUS
  Filled 2020-12-09 (×2): qty 1

## 2020-12-09 NOTE — ED Notes (Signed)
Assisted pt into gown. He goes into a defensive mode when you get near, around, or touch his jacket. Explained to patient I needed to get his gown from under his jacket. Gary [name], NT

## 2020-12-09 NOTE — Progress Notes (Signed)
PROGRESS NOTE   Gary [name]  MEQ:[id number] DOB: Mar 01, 1986 DOA: 12/09/2020 PCP: Patient, No Pcp Per  Brief Narrative:   35 year old white male HIV since 2018 not on HAART Chronic ethanolism homeless multiple suicide attempts in the past Recent admission for delirium tremens needing Precedex in ICU at [facility] until 12/08/2020 Came back to emergency room 1/31 feeling terrible Found to have sinus tach   Assessment & Plan:   Principal Problem:   Alcohol withdrawal (HCC) Active Problems:   HIV disease (HCC)   1. Acute alcohol withdrawal a. Recently left AMA from [facility] 1/30 b. Keep on Ativan protocol and monitor trends c. Allow diet-I suspect he will again leave once more AGAINST MEDICAL ADVICE as this is his pattern 2. HIV since 2008 noncompliant on HAART a. Restarted on admission Genvoya 1 tablet every morning 3. Prior suicidality a. Cannot ascertain intent-monitor trends 4. BMI 21  DVT prophylaxis: Lovenox Code Status: Full presumed Family Communication: None present Disposition:  Status is: Observation  The patient will require care spanning > 2 midnights and should be moved to inpatient because: Persistent severe electrolyte disturbances, IV treatments appropriate due to intensity of illness or inability to take PO and Inpatient level of care appropriate due to severity of illness  Dispo: The patient is from: Home              Anticipated d/c is to: Home              Anticipated d/c date is: 2 days              Patient currently is not medically stable to d/c.   Difficult to place patient No       Consultants:   None currently  Procedures: None  Antimicrobials: No   Subjective: Somewhat sleepy no distress asking to eat closely needs to take Ativan before eating otherwise he feels nauseous No chest pain no fever no chills  Objective: Vitals:   12/09/20 0430 12/09/20 0551 12/09/20 0609 12/09/20 0630  BP: 121/87 121/87 121/72 109/68   Pulse: 91 95 94 92  Resp: 15  18 19   Temp:      TempSrc:      SpO2: 99%  97% 97%  Weight:      Height:       No intake or output data in the 24 hours ending 12/09/20 0708 Filed Weights   12/09/20 0117  Weight: 72.6 kg    Examination: Awake coherent no distress EOMI NCAT no focal deficit CTA B no added sound Abdomen soft No lower extremity edema Disheveled Neurologically grossly intact   Data Reviewed: I have personally reviewed following labs and imaging studies  BUNs/creatinine 6/0.8 AST 50 total protein 8.5  COVID-19 Labs  No results for input(s): DDIMER, FERRITIN, LDH, CRP in the last 72 hours.  Lab Results  Component Value Date   SARSCOV2NAA NEGATIVE 12/04/2020   SARSCOV2NAA POSITIVE (A) 10/30/2020   SARSCOV2NAA NEGATIVE 10/20/2020   SARSCOV2NAA NEGATIVE 10/18/2020     Radiology Studies: No results found.   Scheduled Meds: . elvitegravir-cobicistat-emtricitabine-tenofovir  1 tablet Oral Q breakfast  . enoxaparin (LOVENOX) injection  40 mg Subcutaneous Q24H  . folic acid  1 mg Oral Daily  . LORazepam  0-4 mg Oral Q6H   Followed by  . [START ON 12/11/2020] LORazepam  0-4 mg Oral Q12H  . multivitamin with minerals  1 tablet Oral Daily  . nicotine  21 mg Transdermal Once  . thiamine  100 mg Oral Daily   Or  . thiamine  100 mg Intravenous Daily   Continuous Infusions:   LOS: 0 days    Time spent:  This is a no charge note  Rhetta Mura, MD [facility] To contact the attending provider between 7A-7P or the covering provider during after hours 7P-7A, please log into the web site [email or web address] and access using universal [place] password for that web site. If you do not have the password, please call the hospital operator.  12/09/2020, 7:08 AM

## 2020-12-09 NOTE — ED Notes (Signed)
Pt found smoking cigarettes in restroom.  Asked pt to stop and escorted him back to his room.

## 2020-12-09 NOTE — ED Provider Notes (Signed)
[facility]-EMERGENCY DEPT Provider Note   CSN: [id number] Arrival date & time: 12/09/20  0109     History Chief Complaint  Patient presents with  . Withdrawal    Gary [name] is a 35 y.o. male.  Patient presents to the emergency department with a chief complaint of alcohol withdrawal symptoms.  He was recently admitted and had an ICU stay as he was requiring Precedex.  He left AGAINST MEDICAL ADVICE yesterday.  Patient returns tonight stating that he is increasingly tremulous and is having delusions.  He states it feels like he is going back in the alcohol withdrawal.  He states that he did drink some beer yesterday.  The history is provided by the patient. No language interpreter was used.       Past Medical History:  Diagnosis Date  . Alcohol abuse   . HIV (human immunodeficiency virus infection) (HCC)   . Subdural hematoma [facility])     Patient Active Problem List   Diagnosis Date Noted  . 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 Elevated troponin 10/19/2020  . Alcohol withdrawal (HCC) 10/19/2020  . Alcohol abuse   . Alcohol poisoning   . Suicidal ideation 06/14/2020  . Pancreatitis 06/13/2020  . 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  . Malnutrition of moderate degree 05/07/2020  . Gastrointestinal hemorrhage   . Alcoholic hepatitis without ascites   . Melena 05/05/2020  . Fever 05/05/2020  . Alcoholic ketoacidosis 05/05/2020  . Thrombocytopenia (HCC) 07/25/2019  . Transaminitis 07/25/2019  . Diarrhea 07/25/2019  . Traumatic subdural hematoma (HCC) 07/02/2019  . Alcohol abuse with intoxication (HCC) 07/02/2019  . Alcohol abuse with alcohol-induced mood disorder (HCC) 05/13/2019  . Alcohol dependence (HCC) 04/16/2019  . 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 HIV disease (HCC) 06/16/2018  . Polysubstance abuse (HCC) 06/16/2018  . Cigarette smoker 06/16/2018    Past Surgical  History:  Procedure Laterality Date  . INCISION AND DRAINAGE PERIRECTAL ABSCESS N/A 09/24/2016   Procedure: IRRIGATION AND DEBRIDEMENT PERIRECTAL ABSCESS;  Surgeon: Ricarda Frame, MD;  Location: [facility] ORS;  Service: General;  Laterality: N/A;  . none         Family History  Problem Relation Age of Onset  . Alcohol abuse Mother   . Alcohol abuse Father     Social History   Tobacco Use  . Smoking status: Former Smoker    Packs/day: 0.50    Types: Cigarettes  . Smokeless tobacco: Never Used  . Tobacco comment: unable to smoke while incarcerated 6+ months 02/13/20  Vaping Use  . Vaping Use: Never used  Substance Use Topics  . Alcohol use: Yes  . Drug use: Not Currently    Types: Marijuana, Methamphetamines    Comment: last used 04/10/2019    Home Medications Prior to Admission medications   Medication Sig Start Date End Date Taking? Authorizing Provider  elvitegravir-cobicistat-emtricitabine-tenofovir (GENVOYA) 150-150-200-10 MG TABS tablet Take 1 tablet by mouth daily with breakfast. 09/28/20   Clapacs, Jackquline Denmark, MD  FLUoxetine (PROZAC) 20 MG capsule Take 1 capsule (20 mg total) by mouth daily. Patient not taking: Reported on 12/05/2020 09/27/20 10/27/20  Clapacs, Jackquline Denmark, MD  nicotine (NICODERM CQ - DOSED IN MG/24 HOURS) 21 mg/24hr patch Place 1 patch (21 mg total) onto the skin daily. 05/09/20   Burnadette Pop, MD  pantoprazole (PROTONIX) 40 MG tablet Take 1 tablet (40 mg total) by mouth daily. Patient not taking: No sig reported 09/27/20  Clapacs, Jackquline Denmark, MD  famotidine (PEPCID) 20 MG tablet Take 1 tablet (20 mg total) by mouth 2 (two) times daily. Patient not taking: Reported on 06/01/2019 05/14/19 06/02/19  Benjiman Core, MD    Allergies    Tegretol [carbamazepine] and Caffeine  Review of Systems   Review of Systems  All other systems reviewed and are negative.   Physical Exam Updated Vital Signs BP 114/89   Pulse (!) 129   Temp 97.6 F (36.4 C) (Oral)   Resp 16    Ht 6\' 1"  (1.854 m)   Wt 72.6 kg   SpO2 100%   BMI 21.11 kg/m   Physical Exam Vitals and nursing note reviewed.  Constitutional:      Appearance: He is well-developed and well-nourished.  HENT:     Head: Normocephalic and atraumatic.  Eyes:     Conjunctiva/sclera: Conjunctivae normal.  Cardiovascular:     Rate and Rhythm: Regular rhythm. Tachycardia present.     Heart sounds: No murmur heard.   Pulmonary:     Effort: Pulmonary effort is normal. No respiratory distress.     Breath sounds: Normal breath sounds.  Abdominal:     Palpations: Abdomen is soft.     Tenderness: There is no abdominal tenderness.  Musculoskeletal:        General: No edema. Normal range of motion.     Cervical back: Neck supple.  Skin:    General: Skin is warm and dry.  Neurological:     Mental Status: He is alert and oriented to person, place, and time.     Comments: Tremulous GCS 15  Psychiatric:        Mood and Affect: Mood and affect and mood normal.        Behavior: Behavior normal.     ED Results / Procedures / Treatments   Labs (all labs ordered are listed, but only abnormal results are displayed) Labs Reviewed  COMPREHENSIVE METABOLIC PANEL - Abnormal; Notable for the following components:      Result Value   Total Protein 8.5 (*)    AST 50 (*)    All other components within normal limits  SALICYLATE LEVEL - Abnormal; Notable for the following components:   Salicylate Lvl <7.0 (*)    All other components within normal limits  ACETAMINOPHEN LEVEL - Abnormal; Notable for the following components:   Acetaminophen (Tylenol), Serum <10 (*)    All other components within normal limits  ETHANOL - Abnormal; Notable for the following components:   Alcohol, Ethyl (B) 231 (*)    All other components within normal limits  RAPID URINE DRUG SCREEN, HOSP PERFORMED - Abnormal; Notable for the following components:   Benzodiazepines POSITIVE (*)    All other components within normal limits   SARS CORONAVIRUS 2 (TAT 6-24 HRS)  CBC WITH DIFFERENTIAL/PLATELET  CBG MONITORING, ED    EKG None  Radiology No results found.  Procedures .Critical Care Performed by: , PA-C Authorized by: Roxy Horseman, PA-C   Critical care provider statement:    Critical care time (minutes):  50   Critical care was necessary to treat or prevent imminent or life-threatening deterioration of the following conditions: etoh withdrawal.   Critical care was time spent personally by me on the following activities:  Discussions with consultants, evaluation of patient's response to treatment, examination of patient, ordering and performing treatments and interventions, ordering and review of laboratory studies, ordering and review of radiographic studies, pulse oximetry, re-evaluation of patient's  condition, obtaining history from patient or surrogate and review of old charts     Medications Ordered in ED Medications  LORazepam (ATIVAN) injection 0-4 mg (2 mg Intravenous Given 12/09/20 0201)    Or  LORazepam (ATIVAN) tablet 0-4 mg ( Oral See Alternative 12/09/20 0201)  LORazepam (ATIVAN) injection 0-4 mg (has no administration in time range)    Or  LORazepam (ATIVAN) tablet 0-4 mg (has no administration in time range)  thiamine tablet 100 mg (has no administration in time range)    Or  thiamine (B-1) injection 100 mg (has no administration in time range)  LORazepam (ATIVAN) injection 2 mg (2 mg Intravenous Given 12/09/20 0334)    ED Course  I have reviewed the triage vital signs and the nursing notes.  Pertinent labs & imaging results that were available during my care of the patient were reviewed by me and considered in my medical decision making (see chart for details).    MDM Rules/Calculators/A&P                          Patient here with alcohol withdrawal symptoms.  He states that he is feeling shaky and having delusions.  He was just admitted and had an ICU stay and  was on Precedex.  He is noted to be increasingly tachycardic.  Believe that his likely going back into alcohol withdrawal.  Will readmit to the hospitalist.  Patient getting Ativan per protocol.  Blood pressures have been stable.  Appreciate Dr. Toniann Fail for admitting. Final Clinical Impression(s) / ED Diagnoses Final diagnoses:  Alcohol withdrawal syndrome with complication [facility])    Rx / DC Orders ED Discharge Orders    None       Roxy Horseman, PA-C 12/09/20 0403    Mesner, Barbara Cower, MD 12/09/20 [phone]

## 2020-12-09 NOTE — ED Notes (Signed)
He asked for his cigarettes and was told he cannot have them. He requested to leave. We discussed danger of leaving d/t alcohol withdrawal, he says he understands but will drink a beer if he is having symptoms. MD made aware of patient's intent to leave. Pt will sign AMA paperwork and leave.

## 2020-12-09 NOTE — Discharge Summary (Signed)
Physician Discharge Summary  KHIRY Gary [name] ELF:[id number] DOB: Aug 22, 1986 DOA: 12/09/2020  PCP: Gary [name], No Pcp Per  Admit date: 12/09/2020 Discharge date: 12/09/2020  Time spent: 22 minutes  Recommendations for Outpatient Follow-up:  1. None left AGAINST MEDICAL ADVICE  Discharge Diagnoses:  Principal Problem:   Alcohol withdrawal (HCC) Active Problems:   HIV disease (HCC)   Discharge Condition: Guarded  Diet recommendation: None  Filed Weights   12/09/20 0117  Weight: 72.6 kg    History of present illness:  35 year old white male HIV since 2018 not compliant on medications prior homelessness ethanolism suicidal attempt recent discharge from hospital where he left AMA 12/08/2020 came back to emergency room 12/09/2020 at [facility] found to have sinus tach and alcohol withdrawal Left once again AGAINST MEDICAL ADVICE Very high risk for readmission and death-he signed the paperwork and has understood that leaving will be detrimental to his health not examined on discharge as left AMA Discharge Exam: Vitals:   12/09/20 0830 12/09/20 0924  BP: 101/64 (!) 114/106  Pulse: 85 (!) 106  Resp: 18   Temp:    SpO2: 97%     Not examined on discharge stress left AGAINST MEDICAL ADVICE:   Discharge Instructions    Allergies as of 12/09/2020      Reactions   Tegretol [carbamazepine] Other (See Comments)   Caused vertigo for 2 days after taking it   Caffeine Palpitations    None given as left AMA     Allergies  Allergen Reactions  . Tegretol [Carbamazepine] Other (See Comments)    Caused vertigo for 2 days after taking it  . Caffeine Palpitations      The results of significant diagnostics from this hospitalization (including imaging, microbiology, ancillary and laboratory) are listed below for reference.    Significant Diagnostic Studies: DG Hand Complete Right  Result Date: 12/04/2020 CLINICAL DATA:  35 year old male with right hand pain. EXAM: RIGHT HAND -  COMPLETE 3+ VIEW COMPARISON:  None. FINDINGS: There is no evidence of fracture or dislocation. There is no evidence of arthropathy or other focal bone abnormality. Soft tissues are unremarkable. IMPRESSION: Negative. Electronically Signed   By: Elgie Collard M.D.   On: 12/04/2020 20:14    Microbiology: Recent Results (from the past 240 hour(s))  SARS Coronavirus 2 by RT PCR (hospital order, performed in [facility] hospital lab) Nasopharyngeal Nasopharyngeal Swab     Status: None   Collection Time: 12/04/20  7:51 PM   Specimen: Nasopharyngeal Swab  Result Value Ref Range Status   SARS Coronavirus 2 NEGATIVE NEGATIVE Final    Comment: (NOTE) SARS-CoV-2 target nucleic acids are NOT DETECTED.  The SARS-CoV-2 RNA is generally detectable in upper and lower respiratory specimens during the acute phase of infection. The lowest concentration of SARS-CoV-2 viral copies this assay can detect is 250 copies / mL. A negative result does not preclude SARS-CoV-2 infection and should not be used as the sole basis for treatment or other Gary [name] management decisions.  A negative result may occur with improper specimen collection / handling, submission of specimen other than nasopharyngeal swab, presence of viral mutation(s) within the areas targeted by this assay, and inadequate number of viral copies (<250 copies / mL). A negative result must be combined with clinical observations, Gary [name] history, and epidemiological information.  Fact Sheet for Patients:   BoilerBrush.com.cy  Fact Sheet for Healthcare Providers: https://pope.com/  This test is not yet approved or  cleared by the [place] FDA and has been  authorized for detection and/or diagnosis of SARS-CoV-2 by FDA under an Emergency Use Authorization (EUA).  This EUA will remain in effect (meaning this test can be used) for the duration of the COVID-19 declaration under Section 564(b)(1) of  the Act, 21 U.S.C. section 360bbb-3(b)(1), unless the authorization is terminated or revoked sooner.  Performed at [facility], [address] [place], [place] [address]   MRSA PCR Screening     Status: None   Collection Time: 12/06/20  5:25 PM   Specimen: Nasal Mucosa; Nasopharyngeal  Result Value Ref Range Status   MRSA by PCR NEGATIVE NEGATIVE Final    Comment:        The GeneXpert MRSA Assay (FDA approved for NASAL specimens only), is one component of a comprehensive MRSA colonization surveillance program. It is not intended to diagnose MRSA infection nor to guide or monitor treatment for MRSA infections. Performed at [facility], [address], [place], [place] [address]      Labs: Basic Metabolic Panel: Recent Labs  Lab 12/04/20 1931 12/05/20 2217 12/06/20 0527 12/07/20 0854 12/08/20 0215 12/09/20 0130  NA 142  --  138 137 136 140  K 4.0  --  3.4* 3.9 3.9 4.3  CL 105  --  102 104 105 103  CO2 25  --  22 25 24 23   GLUCOSE 89  --  67* 99 100* 97  BUN 5*  --  10 5* <5* 6  CREATININE 0.67  --  0.74 0.72 0.67 0.80  CALCIUM 9.7  --  8.8* 9.1 9.2 9.8  MG  --  1.9  --  1.8 1.6*  --   PHOS  --  4.1  --  3.2  --   --    Liver Function Tests: Recent Labs  Lab 12/04/20 1931 12/06/20 0527 12/09/20 0130  AST 37 27 50*  ALT 26 20 35  ALKPHOS 51 42 47  BILITOT 0.5 1.8* 0.5  PROT 8.2* 6.4* 8.5*  ALBUMIN 4.5 3.5 4.6   No results for input(s): LIPASE, AMYLASE in the last 168 hours. No results for input(s): AMMONIA in the last 168 hours. CBC: Recent Labs  Lab 12/04/20 1931 12/06/20 0527 12/07/20 0854 12/09/20 0130  WBC 7.6 3.6* 3.0* 7.4  NEUTROABS  --   --   --  4.8  HGB 15.0 13.6 13.1 15.2  HCT 44.4 40.3 39.7 44.5  MCV 95.1 97.3 98.0 97.2  PLT 323 140* 145* 210   Cardiac Enzymes: Recent Labs  Lab 12/05/20 2202  CKTOTAL 219   BNP: BNP (last 3 results) No results for input(s): BNP in the last 8760 hours.  ProBNP (last 3  results) No results for input(s): PROBNP in the last 8760 hours.  CBG: Recent Labs  Lab 12/07/20 0736 12/07/20 1142 12/07/20 1605 12/07/20 1926 12/09/20 0153  GLUCAP 76 95 128* 124* 90       Signed:  12/11/20 MD   [facility] 12/09/2020, 10:30 AM

## 2020-12-09 NOTE — Discharge Summary (Signed)
35 yo homeless male smoker with hx of ETOH presented to ED on 1/26 with suicidal ideation.  ETOH level on admission 433.  Evaluated by psychiatry and felt to not be risk for self harm.  Developed alcohol withdrawal while waiting in ER for bed placement.  Required precedex and admitted to ICU.  On 12/08/20 he decided he was feeling better and left hospital against medical advice.  Final diagnoses: - Suicidal ideation - evaluated by psychiatry and not felt to be risk for harm to self or others - Alcohol abuse - Acute alcohol withdrawal  - Hypomagnesemia - history of depression - history of peripheral neuropathy - tobacco abuse - history of HIV - Homeless  Coralyn Helling, MD [facility] Pulmonary/Critical Care Pager - [phone] 12/09/2020, 3:36 PM

## 2020-12-09 NOTE — ED Triage Notes (Addendum)
Patient arrived stating he is having alcohol withdrawal. Patient refusing to give any detail in symptoms or when his last drink was. Verbally aggressive with staff when attempting to triage patient. Patient declines any SI or HI

## 2020-12-09 NOTE — ED Notes (Addendum)
Patient found smoking cigarettes in bathroom. Cigarettes confiscated and at nurse station. Patient educated that he should not be smoking in the hospital. Charge RN notified.

## 2020-12-09 NOTE — ED Notes (Signed)
RN walked into patients room. Patient starting to take off heart lead monitors, pulse oximetry, blood pressure cuff saying he "is going outside to smoke a cigarette." RN told patient that I would inform the provider to get medication for this such as a patch. Browning PA notified.

## 2020-12-09 NOTE — ED Notes (Signed)
Patient requesting more medication because he feels shaky and having delusions. Robert PA notified. Awaiting orders.

## 2020-12-09 NOTE — ED Notes (Signed)
Provided Pt with urinal and advised urine specimen needed. Gary [name], NT

## 2020-12-09 NOTE — H&P (Signed)
History and Physical    Gary [name] WSF:[id number] DOB: 12-Dec-1985 DOA: 12/09/2020  PCP: Patient, No Pcp Per  Patient coming from: Patient is homeless.  Chief Complaint: Tremors.  HPI: [name] is a 35 y.o. male with history of alcohol abuse and HIV who was recently admitted for alcohol withdrawal symptoms was placed on Precedex following which patient improved and patient signed out AGAINST MEDICAL ADVICE yesterday states he he drank some beer and started having symptoms of withdrawal and presents to the ER.  Patient symptoms are mostly tremors.  Denies drinking any other type of alcohols or using any Tylenol or drug overdose.  During the last day patient initially was evaluated for suicidal ideation but denies any now.  ED Course: In the ER patient was tachycardic and tremulous labs showing mildly elevated AST COVID test is pending EKG shows sinus tachycardia.  Patient admitted for further observation alcohol withdrawal.  Started on CIWA protocol.  Salicylate and Tylenol levels are negative.  Review of Systems: As per HPI, rest all negative.   Past Medical History:  Diagnosis Date  . Alcohol abuse   . HIV (human immunodeficiency virus infection) (HCC)   . Subdural hematoma [facility])     Past Surgical History:  Procedure Laterality Date  . INCISION AND DRAINAGE PERIRECTAL ABSCESS N/A 09/24/2016   Procedure: IRRIGATION AND DEBRIDEMENT PERIRECTAL ABSCESS;  Surgeon: Ricarda Frame, MD;  Location: [facility] ORS;  Service: General;  Laterality: N/A;  . none       reports that he has quit smoking. His smoking use included cigarettes. He smoked 0.50 packs per day. He has never used smokeless tobacco. He reports current alcohol use. He reports previous drug use. Drugs: Marijuana and Methamphetamines.  Allergies  Allergen Reactions  . Tegretol [Carbamazepine] Other (See Comments)    Caused vertigo for 2 days after taking it  . Caffeine Palpitations    Family History  Problem  Relation Age of Onset  . Alcohol abuse Mother   . Alcohol abuse Father     Prior to Admission medications   Medication Sig Start Date End Date Taking? Authorizing Provider  elvitegravir-cobicistat-emtricitabine-tenofovir (GENVOYA) 150-150-200-10 MG TABS tablet Take 1 tablet by mouth daily with breakfast. 09/28/20   Clapacs, Jackquline Denmark, MD  FLUoxetine (PROZAC) 20 MG capsule Take 1 capsule (20 mg total) by mouth daily. Patient not taking: Reported on 12/05/2020 09/27/20 10/27/20  Clapacs, Jackquline Denmark, MD  nicotine (NICODERM CQ - DOSED IN MG/24 HOURS) 21 mg/24hr patch Place 1 patch (21 mg total) onto the skin daily. 05/09/20   Burnadette Pop, MD  pantoprazole (PROTONIX) 40 MG tablet Take 1 tablet (40 mg total) by mouth daily. Patient not taking: No sig reported 09/27/20   Clapacs, Jackquline Denmark, MD  famotidine (PEPCID) 20 MG tablet Take 1 tablet (20 mg total) by mouth 2 (two) times daily. Patient not taking: Reported on 06/01/2019 05/14/19 06/02/19  Benjiman Core, MD    Physical Exam: Constitutional: Moderately built and nourished. Vitals:   12/09/20 0117 12/09/20 0140 12/09/20 0230 12/09/20 0328  BP: 120/76 120/76 112/73 114/89  Pulse: (!) 116 (!) 116 (!) 125 (!) 129  Resp: 18  16   Temp: 97.6 F (36.4 C)     TempSrc: Oral     SpO2: 100%  100%   Weight: 72.6 kg     Height: 6\' 1"  (1.854 m)      Eyes: Anicteric no pallor. ENMT: No discharge from the ears eyes nose or mouth. Neck: No  mass felt.  No neck rigidity. Respiratory: No rhonchi or crepitations. Cardiovascular: S1-S2 heard. Abdomen: Soft nontender bowel sounds present. Musculoskeletal: No edema. Skin: No rash. Neurologic: Alert awake oriented time place and person.  Moves all extremities. Psychiatric: Appears normal.  Denies any suicidal ideation.   Labs on Admission: I have personally reviewed following labs and imaging studies  CBC: Recent Labs  Lab 12/04/20 1931 12/06/20 0527 12/07/20 0854 12/09/20 0130  WBC 7.6 3.6* 3.0* 7.4   NEUTROABS  --   --   --  4.8  HGB 15.0 13.6 13.1 15.2  HCT 44.4 40.3 39.7 44.5  MCV 95.1 97.3 98.0 97.2  PLT 323 140* 145* 210   Basic Metabolic Panel: Recent Labs  Lab 12/04/20 1931 12/05/20 2217 12/06/20 0527 12/07/20 0854 12/08/20 0215 12/09/20 0130  NA 142  --  138 137 136 140  K 4.0  --  3.4* 3.9 3.9 4.3  CL 105  --  102 104 105 103  CO2 25  --  22 25 24 23   GLUCOSE 89  --  67* 99 100* 97  BUN 5*  --  10 5* <5* 6  CREATININE 0.67  --  0.74 0.72 0.67 0.80  CALCIUM 9.7  --  8.8* 9.1 9.2 9.8  MG  --  1.9  --  1.8 1.6*  --   PHOS  --  4.1  --  3.2  --   --    GFR: Estimated Creatinine Clearance: 133.6 mL/min (by C-G formula based on SCr of 0.8 mg/dL). Liver Function Tests: Recent Labs  Lab 12/04/20 1931 12/06/20 0527 12/09/20 0130  AST 37 27 50*  ALT 26 20 35  ALKPHOS 51 42 47  BILITOT 0.5 1.8* 0.5  PROT 8.2* 6.4* 8.5*  ALBUMIN 4.5 3.5 4.6   No results for input(s): LIPASE, AMYLASE in the last 168 hours. No results for input(s): AMMONIA in the last 168 hours. Coagulation Profile: No results for input(s): INR, PROTIME in the last 168 hours. Cardiac Enzymes: Recent Labs  Lab 12/05/20 2202  CKTOTAL 219   BNP (last 3 results) No results for input(s): PROBNP in the last 8760 hours. HbA1C: No results for input(s): HGBA1C in the last 72 hours. CBG: Recent Labs  Lab 12/07/20 0736 12/07/20 1142 12/07/20 1605 12/07/20 1926 12/09/20 0153  GLUCAP 76 95 128* 124* 90   Lipid Profile: No results for input(s): CHOL, HDL, LDLCALC, TRIG, CHOLHDL, LDLDIRECT in the last 72 hours. Thyroid Function Tests: No results for input(s): TSH, T4TOTAL, FREET4, T3FREE, THYROIDAB in the last 72 hours. Anemia Panel: No results for input(s): VITAMINB12, FOLATE, FERRITIN, TIBC, IRON, RETICCTPCT in the last 72 hours. Urine analysis:    Component Value Date/Time   COLORURINE STRAW (A) 05/15/2020 1824   APPEARANCEUR CLEAR 05/15/2020 1824   APPEARANCEUR Clear 11/15/2014 1755    LABSPEC 1.003 (L) 05/15/2020 1824   LABSPEC 1.002 11/15/2014 1755   PHURINE 6.0 05/15/2020 1824   GLUCOSEU NEGATIVE 05/15/2020 1824   GLUCOSEU Negative 11/15/2014 1755   HGBUR NEGATIVE 05/15/2020 1824   BILIRUBINUR NEGATIVE 05/15/2020 1824   BILIRUBINUR Negative 11/15/2014 1755   KETONESUR NEGATIVE 05/15/2020 1824   PROTEINUR NEGATIVE 05/15/2020 1824   NITRITE NEGATIVE 05/15/2020 1824   LEUKOCYTESUR NEGATIVE 05/15/2020 1824   LEUKOCYTESUR Negative 11/15/2014 1755   Sepsis Labs: @LABRCNTIP (procalcitonin:4,lacticidven:4) ) Recent Results (from the past 240 hour(s))  SARS Coronavirus 2 by RT PCR (hospital order, performed in [facility] hospital lab) Nasopharyngeal Nasopharyngeal Swab     Status:  None   Collection Time: 12/04/20  7:51 PM   Specimen: Nasopharyngeal Swab  Result Value Ref Range Status   SARS Coronavirus 2 NEGATIVE NEGATIVE Final    Comment: (NOTE) SARS-CoV-2 target nucleic acids are NOT DETECTED.  The SARS-CoV-2 RNA is generally detectable in upper and lower respiratory specimens during the acute phase of infection. The lowest concentration of SARS-CoV-2 viral copies this assay can detect is 250 copies / mL. A negative result does not preclude SARS-CoV-2 infection and should not be used as the sole basis for treatment or other patient management decisions.  A negative result may occur with improper specimen collection / handling, submission of specimen other than nasopharyngeal swab, presence of viral mutation(s) within the areas targeted by this assay, and inadequate number of viral copies (<250 copies / mL). A negative result must be combined with clinical observations, patient history, and epidemiological information.  Fact Sheet for Patients:   BoilerBrush.com.cy  Fact Sheet for Healthcare Providers: https://pope.com/  This test is not yet approved or  cleared by the [place] FDA and has been authorized  for detection and/or diagnosis of SARS-CoV-2 by FDA under an Emergency Use Authorization (EUA).  This EUA will remain in effect (meaning this test can be used) for the duration of the COVID-19 declaration under Section 564(b)(1) of the Act, 21 U.S.C. section 360bbb-3(b)(1), unless the authorization is terminated or revoked sooner.  Performed at [facility], [address] [place], [place] [address]   MRSA PCR Screening     Status: None   Collection Time: 12/06/20  5:25 PM   Specimen: Nasal Mucosa; Nasopharyngeal  Result Value Ref Range Status   MRSA by PCR NEGATIVE NEGATIVE Final    Comment:        The GeneXpert MRSA Assay (FDA approved for NASAL specimens only), is one component of a comprehensive MRSA colonization surveillance program. It is not intended to diagnose MRSA infection nor to guide or monitor treatment for MRSA infections. Performed at [facility], [address], [place], [place] [address]      Radiological Exams on Admission: No results found.  EKG: Independently reviewed.  Sinus tachycardia.  Assessment/Plan Principal Problem:   Alcohol withdrawal (HCC) Active Problems:   HIV disease (HCC)    1. Alcohol withdrawal for which patient has been placed on CIWA protocol social work consult.  Advised about quitting. 2. HIV disease -not sure about the compliance.  Last viral count last week was around 20.  We will continue antiretroviral.   DVT prophylaxis: Lovenox if patient allows. Code Status: Full code. Family Communication: Discussed with patient. Disposition Plan: To be determined. Consults called: Social work. Admission status: Observation.   Eduard Clos MD [facility] Pager [phone].  If 7PM-7AM, please contact night-coverage [email or web address] Password [email or web address]  12/09/2020, 4:21 AM

## 2020-12-09 NOTE — ED Notes (Signed)
Patient becoming more agitated, seeing wavy lines on the wall, "feels awful." Dahlia Client, PA gave verbal order for another dose of 2mg  Ativan IV.

## 2020-12-21 ENCOUNTER — Other Ambulatory Visit: Payer: Self-pay

## 2020-12-21 ENCOUNTER — Emergency Department
Admission: EM | Admit: 2020-12-21 | Discharge: 2020-12-22 | Disposition: A | Discharge status: Home / Self Care | Payer: Self-pay | Attending: Emergency Medicine | Admitting: Emergency Medicine

## 2020-12-21 DIAGNOSIS — Z21 Asymptomatic human immunodeficiency virus [HIV] infection status: Secondary | ICD-10-CM | POA: Insufficient documentation

## 2020-12-21 DIAGNOSIS — F10120 Alcohol abuse with intoxication, uncomplicated: Secondary | ICD-10-CM | POA: Insufficient documentation

## 2020-12-21 DIAGNOSIS — F10920 Alcohol use, unspecified with intoxication, uncomplicated: Secondary | ICD-10-CM

## 2020-12-21 DIAGNOSIS — Z79899 Other long term (current) drug therapy: Secondary | ICD-10-CM | POA: Insufficient documentation

## 2020-12-21 DIAGNOSIS — Z87891 Personal history of nicotine dependence: Secondary | ICD-10-CM | POA: Insufficient documentation

## 2020-12-21 LAB — COMPREHENSIVE METABOLIC PANEL
ALT: 23 U/L (ref 0–44)
AST: 29 U/L (ref 15–41)
Albumin: 4.5 g/dL (ref 3.5–5.0)
Alkaline Phosphatase: 46 U/L (ref 38–126)
Anion gap: 12 (ref 5–15)
BUN: 12 mg/dL (ref 6–20)
CO2: 24 mmol/L (ref 22–32)
Calcium: 8.6 mg/dL — ABNORMAL LOW (ref 8.9–10.3)
Chloride: 105 mmol/L (ref 98–111)
Creatinine, Ser: 0.81 mg/dL (ref 0.61–1.24)
GFR, Estimated: >60 mL/min (ref >60)
Glucose, Bld: 139 mg/dL — ABNORMAL HIGH (ref 70–99)
Potassium: 3.8 mmol/L (ref 3.5–5.1)
Sodium: 141 mmol/L (ref 135–145)
Total Bilirubin: 0.5 mg/dL (ref 0.3–1.2)
Total Protein: 7.9 g/dL (ref 6.5–8.1)

## 2020-12-21 LAB — CBC
HCT: 37.6 % — ABNORMAL LOW (ref 39.0–52.0)
Hemoglobin: 12.9 g/dL — ABNORMAL LOW (ref 13.0–17.0)
MCH: 32.7 pg (ref 26.0–34.0)
MCHC: 34.3 g/dL (ref 30.0–36.0)
MCV: 95.4 fL (ref 80.0–100.0)
Platelets: 290 10*3/uL (ref 150–400)
RBC: 3.94 MIL/uL — ABNORMAL LOW (ref 4.22–5.81)
RDW: 13.3 % (ref 11.5–15.5)
WBC: 6.4 10*3/uL (ref 4.0–10.5)
nRBC: 0 % (ref 0.0–0.2)

## 2020-12-21 MED ORDER — THIAMINE HCL 100 MG PO TABS
100.0000 mg | ORAL_TABLET | Freq: Every day | ORAL | Status: DC
  Administered 2020-12-22: 100 mg via ORAL
  Filled 2020-12-21: qty 1

## 2020-12-21 MED ORDER — THIAMINE HCL 100 MG/ML IJ SOLN
Freq: Once | INTRAVENOUS | Status: AC
  Filled 2020-12-21: qty 1000

## 2020-12-21 MED ORDER — LORAZEPAM 2 MG/ML IJ SOLN
0.0000 mg | Freq: Four times a day (QID) | INTRAMUSCULAR | Status: DC
  Administered 2020-12-22: 1 mg via INTRAVENOUS
  Administered 2020-12-22: 2 mg via INTRAVENOUS
  Filled 2020-12-21 (×2): qty 1

## 2020-12-21 MED ORDER — SODIUM CHLORIDE 0.9 % IV BOLUS
1000.0000 mL | Freq: Once | INTRAVENOUS | Status: AC
  Administered 2020-12-22: 1000 mL via INTRAVENOUS

## 2020-12-21 NOTE — ED Notes (Signed)
Patient to waiting room ambulatory by EMS.  Per EMS [place] [employer] [place] and requested to come to the ED for alcohol detox.  Patient refused to allow EMS to get vital signs.

## 2020-12-21 NOTE — ED Notes (Signed)
Pt refusing to provide urine sample at this time.

## 2020-12-21 NOTE — ED Provider Notes (Signed)
[facility] Emergency Department Provider Note   ____________________________________________   Event Date/Time   First MD Initiated Contact with Patient 12/21/20 2354     (approximate)  I have reviewed the triage vital signs and the nursing notes.   HISTORY  Chief Complaint Alcohol Problem  Level of V caveat: Limited by intoxication  HPI Gary [name] is a 35 y.o. male who presents to the ED claiming he is having "seizures from alcohol withdrawal". History is limited secondary to intoxication. He is a daily drinker. No prior histories of DTs. When asked to describe his seizure, he describes it as he was awake and "twitching". Also has a history of methamphetamine use and homelessness. Denies SI/HI/AH/VH. Is interested in speaking with TTS for detox.     Past Medical History:  Diagnosis Date  . Alcohol abuse   . HIV (human immunodeficiency virus infection) (HCC)   . Subdural hematoma [facility])     Patient Active Problem List   Diagnosis Date Noted  . 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 Elevated troponin 10/19/2020  . Alcohol withdrawal (HCC) 10/19/2020  . Alcohol abuse   . Alcohol poisoning   . Suicidal ideation 06/14/2020  . Pancreatitis 06/13/2020  . 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  . Malnutrition of moderate degree 05/07/2020  . Gastrointestinal hemorrhage   . Alcoholic hepatitis without ascites   . Melena 05/05/2020  . Fever 05/05/2020  . Alcoholic ketoacidosis 05/05/2020  . Thrombocytopenia (HCC) 07/25/2019  . Transaminitis 07/25/2019  . Diarrhea 07/25/2019  . Traumatic subdural hematoma (HCC) 07/02/2019  . Alcohol abuse with intoxication (HCC) 07/02/2019  . Alcohol abuse with alcohol-induced mood disorder (HCC) 05/13/2019  . Alcohol dependence (HCC) 04/16/2019  . 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 HIV disease (HCC) 06/16/2018  . Polysubstance abuse (HCC)  06/16/2018  . Cigarette smoker 06/16/2018    Past Surgical History:  Procedure Laterality Date  . INCISION AND DRAINAGE PERIRECTAL ABSCESS N/A 09/24/2016   Procedure: IRRIGATION AND DEBRIDEMENT PERIRECTAL ABSCESS;  Surgeon: Ricarda Frame, MD;  Location: [facility] ORS;  Service: General;  Laterality: N/A;  . none      Prior to Admission medications   Medication Sig Start Date End Date Taking? Authorizing Provider  elvitegravir-cobicistat-emtricitabine-tenofovir (GENVOYA) 150-150-200-10 MG TABS tablet Take 1 tablet by mouth daily with breakfast. 09/28/20   Clapacs, Jackquline Denmark, MD  famotidine (PEPCID) 20 MG tablet Take 1 tablet (20 mg total) by mouth 2 (two) times daily. Patient not taking: Reported on 06/01/2019 05/14/19 06/02/19  Benjiman Core, MD    Allergies Tegretol [carbamazepine] and Caffeine  Family History  Problem Relation Age of Onset  . Alcohol abuse Mother   . Alcohol abuse Father     Social History Social History   Tobacco Use  . Smoking status: Former Smoker    Packs/day: 0.50    Types: Cigarettes  . Smokeless tobacco: Never Used  . Tobacco comment: unable to smoke while incarcerated 6+ months 02/13/20  Vaping Use  . Vaping Use: Never used  Substance Use Topics  . Alcohol use: Yes  . Drug use: Not Currently    Types: Marijuana, Methamphetamines    Comment: last used 04/10/2019    Review of Systems  Constitutional: No fever/chills Eyes: No visual changes. ENT: No sore throat. Cardiovascular: Denies chest pain. Respiratory: Denies shortness of breath. Gastrointestinal: No abdominal pain.  No nausea, no vomiting.  No diarrhea.  No constipation. Genitourinary: Negative for dysuria. Musculoskeletal: Negative  for back pain. Skin: Negative for rash. Neurological: Negative for headaches, focal weakness or numbness. Psychiatric:  Positive for EtOH abuse.  ____________________________________________   PHYSICAL EXAM:  VITAL SIGNS: ED Triage Vitals  Enc Vitals  Group     BP 12/21/20 2147 115/76     Pulse Rate 12/21/20 2147 100     Resp 12/21/20 2147 18     Temp 12/21/20 2147 97.9 F (36.6 C)     Temp Source 12/21/20 2147 Oral     SpO2 12/21/20 2147 100 %     Weight 12/21/20 2149 155 lb (70.3 kg)     Height 12/21/20 2149 6\' 1"  (1.854 m)     Head Circumference --      Peak Flow --      Pain Score 12/21/20 2149 0     Pain Loc --      Pain Edu? --      Excl. in GC? --     Constitutional: Intoxicated. Well appearing and in no acute distress. Eyes: Conjunctivae are normal. PERRL. EOMI. Head: Atraumatic. Nose: Atraumatic. Mouth/Throat: Mucous membranes are moist. No dental malocclusion. Neck: No stridor.  No cervical spine tenderness to palpation. Cardiovascular: Normal rate, regular rhythm. Grossly normal heart sounds.  Good peripheral circulation. Respiratory: Normal respiratory effort.  No retractions. Lungs CTAB. Gastrointestinal: Soft and nontender. No distention. No abdominal bruits. No CVA tenderness. Musculoskeletal: No lower extremity tenderness nor edema.  No joint effusions. Neurologic: Intoxicated. Slurred speech and language. No gross focal neurologic deficits are appreciated. MAEx4. Skin:  Skin is warm, dry and intact. No rash noted. Psychiatric: Unable to assess secondary to intoxication. ____________________________________________   LABS (all labs ordered are listed, but only abnormal results are displayed)  Labs Reviewed  COMPREHENSIVE METABOLIC PANEL - Abnormal; Notable for the following components:      Result Value   Glucose, Bld 139 (*)    Calcium 8.6 (*)    All other components within normal limits  ETHANOL - Abnormal; Notable for the following components:   Alcohol, Ethyl (B) 356 (*)    All other components within normal limits  CBC - Abnormal; Notable for the following components:   RBC 3.94 (*)    Hemoglobin 12.9 (*)    HCT 37.6 (*)    All other components within normal limits  URINE DRUG SCREEN,  QUALITATIVE ([facility] ONLY)   ____________________________________________  EKG  None ____________________________________________  RADIOLOGY I, Seraphim Affinito J, personally viewed and evaluated these images (plain radiographs) as part of my medical decision making, as well as reviewing the written report by the radiologist.  ED MD interpretation: None  Official radiology report(s): No results found.  ____________________________________________   PROCEDURES  Procedure(s) performed (including Critical Care):  Procedures   ____________________________________________   INITIAL IMPRESSION / ASSESSMENT AND PLAN / ED COURSE  As part of my medical decision making, I reviewed the following data within the electronic MEDICAL RECORD NUMBER Nursing notes reviewed and incorporated, Labs reviewed, Old chart reviewed, A consult was requested and obtained from this/these consultant(s) TTS and Notes from prior ED visits     35 year old male with history of EtOH and methamphetamine abuse here for claims of alcohol withdrawal and requesting detox. Patient is extremely intoxicated; doubt DTs. Will initiate IV fluid resuscitation, placed on CIWA protocol and consult TTS for evaluation. Will observe in the ED until sobriety.  Clinical Course as of 12/22/20 12/24/20  1610 Dec 22, 2020  Dec 24, 2020 TTS was unable to evaluate patient due to his  intoxication.  They will try again once patient is sober [JS]  0606 Patient remains asleep in the ED.  Pending TTS evaluation once he is awake and sober. [JS]    Clinical Course User Index [JS] Irean Hong, MD     ____________________________________________   FINAL CLINICAL IMPRESSION(S) / ED DIAGNOSES  Final diagnoses:  Alcoholic intoxication without complication [facility]     ED Discharge Orders    None      *Please note:  Gary [name] was evaluated in Emergency Department on 12/22/2020 for the symptoms described in the history of present illness. He was  evaluated in the context of the global COVID-19 pandemic, which necessitated consideration that the patient might be at risk for infection with the SARS-CoV-2 virus that causes COVID-19. Institutional protocols and algorithms that pertain to the evaluation of patients at risk for COVID-19 are in a state of rapid change based on information released by regulatory bodies including the CDC and federal and state organizations. These policies and algorithms were followed during the patient's care in the ED.  Some ED evaluations and interventions may be delayed as a result of limited staffing during and the pandemic.*   Note:  This document was prepared using Dragon voice recognition software and may include unintentional dictation errors.   Irean Hong, MD 12/22/20 [phone]

## 2020-12-21 NOTE — ED Notes (Signed)
Date and time results received: 12/21/20 10:47 PM   Test: Alcohol Critical Value: 356  Name of Provider Notified: Paduchowski MD First nurse, Dawn RN, and charge nurse, Elon Jester RN, made aware.

## 2020-12-21 NOTE — ED Triage Notes (Signed)
Pt states "I'm having seizures from alcohol withdrawals." When asks to describe seizure pt states "I couldn't see straight for a while and I was twitching." Pt states last drink was earlier today. Pt alert and oriented, ambulatory at this time in no acute distress. Endorses he drinks "an uncontrollable amount of alcohol" on a daily basis. No tremors noted, endorses nausea, no diaphoresis noted, pt occasionally jerking bilateral arms but no seizure activity noted, denies headache. CIWA score 4 in triage.

## 2020-12-21 NOTE — ED Provider Notes (Incomplete)
[facility] Emergency Department Provider Note   ____________________________________________   Event Date/Time   First MD Initiated Contact with Patient 12/21/20 2354     (approximate)  I have reviewed the triage vital signs and the nursing notes.   HISTORY  Chief Complaint Alcohol Problem  Level of V caveat: Limited by intoxication  HPI Gary [name] is a 35 y.o. male who presents to the ED claiming he is having "seizures from alcohol withdrawal". History is limited secondary to intoxication. He is a daily drinker. No prior histories of DTs. When asked to describe his seizure, he describes it as he was awake and "twitching". Also has a history of methamphetamine use and homelessness. Denies SI/HI/AH/VH. Is interested in speaking with TTS for detox.     Past Medical History:  Diagnosis Date  . Alcohol abuse   . HIV (human immunodeficiency virus infection) (HCC)   . Subdural hematoma [facility])     Patient Active Problem List   Diagnosis Date Noted  . 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 Elevated troponin 10/19/2020  . Alcohol withdrawal (HCC) 10/19/2020  . Alcohol abuse   . Alcohol poisoning   . Suicidal ideation 06/14/2020  . Pancreatitis 06/13/2020  . 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  . Malnutrition of moderate degree 05/07/2020  . Gastrointestinal hemorrhage   . Alcoholic hepatitis without ascites   . Melena 05/05/2020  . Fever 05/05/2020  . Alcoholic ketoacidosis 05/05/2020  . Thrombocytopenia (HCC) 07/25/2019  . Transaminitis 07/25/2019  . Diarrhea 07/25/2019  . Traumatic subdural hematoma (HCC) 07/02/2019  . Alcohol abuse with intoxication (HCC) 07/02/2019  . Alcohol abuse with alcohol-induced mood disorder (HCC) 05/13/2019  . Alcohol dependence (HCC) 04/16/2019  . 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 HIV disease (HCC) 06/16/2018  . Polysubstance abuse (HCC)  06/16/2018  . Cigarette smoker 06/16/2018    Past Surgical History:  Procedure Laterality Date  . INCISION AND DRAINAGE PERIRECTAL ABSCESS N/A 09/24/2016   Procedure: IRRIGATION AND DEBRIDEMENT PERIRECTAL ABSCESS;  Surgeon: Ricarda Frame, MD;  Location: [facility] ORS;  Service: General;  Laterality: N/A;  . none      Prior to Admission medications   Medication Sig Start Date End Date Taking? Authorizing Provider  elvitegravir-cobicistat-emtricitabine-tenofovir (GENVOYA) 150-150-200-10 MG TABS tablet Take 1 tablet by mouth daily with breakfast. 09/28/20   Clapacs, Jackquline Denmark, MD  famotidine (PEPCID) 20 MG tablet Take 1 tablet (20 mg total) by mouth 2 (two) times daily. Patient not taking: Reported on 06/01/2019 05/14/19 06/02/19  Benjiman Core, MD    Allergies Tegretol [carbamazepine] and Caffeine  Family History  Problem Relation Age of Onset  . Alcohol abuse Mother   . Alcohol abuse Father     Social History Social History   Tobacco Use  . Smoking status: Former Smoker    Packs/day: 0.50    Types: Cigarettes  . Smokeless tobacco: Never Used  . Tobacco comment: unable to smoke while incarcerated 6+ months 02/13/20  Vaping Use  . Vaping Use: Never used  Substance Use Topics  . Alcohol use: Yes  . Drug use: Not Currently    Types: Marijuana, Methamphetamines    Comment: last used 04/10/2019    Review of Systems  Constitutional: No fever/chills Eyes: No visual changes. ENT: No sore throat. Cardiovascular: Denies chest pain. Respiratory: Denies shortness of breath. Gastrointestinal: No abdominal pain.  No nausea, no vomiting.  No diarrhea.  No constipation. Genitourinary: Negative for dysuria. Musculoskeletal: Negative  for back pain. Skin: Negative for rash. Neurological: Negative for headaches, focal weakness or numbness. Psychiatric:  Positive for EtOH abuse.  ____________________________________________   PHYSICAL EXAM:  VITAL SIGNS: ED Triage Vitals  Enc Vitals  Group     BP 12/21/20 2147 115/76     Pulse Rate 12/21/20 2147 100     Resp 12/21/20 2147 18     Temp 12/21/20 2147 97.9 F (36.6 C)     Temp Source 12/21/20 2147 Oral     SpO2 12/21/20 2147 100 %     Weight 12/21/20 2149 155 lb (70.3 kg)     Height 12/21/20 2149 6\' 1"  (1.854 m)     Head Circumference --      Peak Flow --      Pain Score 12/21/20 2149 0     Pain Loc --      Pain Edu? --      Excl. in GC? --     Constitutional: Intoxicated. Well appearing and in no acute distress. Eyes: Conjunctivae are normal. PERRL. EOMI. Head: Atraumatic. Nose: Atraumatic. Mouth/Throat: Mucous membranes are moist. No dental malocclusion. Neck: No stridor.  No cervical spine tenderness to palpation. Cardiovascular: Normal rate, regular rhythm. Grossly normal heart sounds.  Good peripheral circulation. Respiratory: Normal respiratory effort.  No retractions. Lungs CTAB. Gastrointestinal: Soft and nontender. No distention. No abdominal bruits. No CVA tenderness. Musculoskeletal: No lower extremity tenderness nor edema.  No joint effusions. Neurologic: Intoxicated. Slurred speech and language. No gross focal neurologic deficits are appreciated. MAEx4. Skin:  Skin is warm, dry and intact. No rash noted. Psychiatric: Unable to assess secondary to intoxication. ____________________________________________   LABS (all labs ordered are listed, but only abnormal results are displayed)  Labs Reviewed  COMPREHENSIVE METABOLIC PANEL - Abnormal; Notable for the following components:      Result Value   Glucose, Bld 139 (*)    Calcium 8.6 (*)    All other components within normal limits  CBC - Abnormal; Notable for the following components:   RBC 3.94 (*)    Hemoglobin 12.9 (*)    HCT 37.6 (*)    All other components within normal limits  ETHANOL  URINE DRUG SCREEN, QUALITATIVE ([facility] ONLY)    ____________________________________________  EKG  None ____________________________________________  RADIOLOGY I, Asser Lucena J, personally viewed and evaluated these images (plain radiographs) as part of my medical decision making, as well as reviewing the written report by the radiologist.  ED MD interpretation: None  Official radiology report(s): No results found.  ____________________________________________   PROCEDURES  Procedure(s) performed (including Critical Care):  Procedures   ____________________________________________   INITIAL IMPRESSION / ASSESSMENT AND PLAN / ED COURSE  As part of my medical decision making, I reviewed the following data within the electronic MEDICAL RECORD NUMBER Nursing notes reviewed and incorporated, Labs reviewed, Old chart reviewed, A consult was requested and obtained from this/these consultant(s) TTS and Notes from prior ED visits     35 year old male with history of EtOH and methamphetamine abuse here for claims of alcohol withdrawal and requesting detox. Patient is extremely intoxicated; doubt DTs. Will initiate IV fluid resuscitation, placed on CIWA protocol and consult TTS for evaluation. Will observe in the ED until sobriety.      ____________________________________________   FINAL CLINICAL IMPRESSION(S) / ED DIAGNOSES  Final diagnoses:  Alcoholic intoxication without complication [facility])     ED Discharge Orders    None      *Please note:  OTT ZIMMERLE was  evaluated in Emergency Department on 12/21/2020 for the symptoms described in the history of present illness. He was evaluated in the context of the global COVID-19 pandemic, which necessitated consideration that the patient might be at risk for infection with the SARS-CoV-2 virus that causes COVID-19. Institutional protocols and algorithms that pertain to the evaluation of patients at risk for COVID-19 are in a state of rapid change based on information released by  regulatory bodies including the CDC and federal and state organizations. These policies and algorithms were followed during the patient's care in the ED.  Some ED evaluations and interventions may be delayed as a result of limited staffing during and the pandemic.*   Note:  This document was prepared using Dragon voice recognition software and may include unintentional dictation errors.

## 2020-12-22 ENCOUNTER — Other Ambulatory Visit: Payer: Self-pay

## 2020-12-22 ENCOUNTER — Emergency Department
Admission: EM | Admit: 2020-12-22 | Discharge: 2020-12-23 | Disposition: A | Discharge status: Home / Self Care | Payer: Self-pay | Attending: Emergency Medicine | Admitting: Emergency Medicine

## 2020-12-22 ENCOUNTER — Encounter: Payer: Self-pay | Admitting: Emergency Medicine

## 2020-12-22 DIAGNOSIS — Z21 Asymptomatic human immunodeficiency virus [HIV] infection status: Secondary | ICD-10-CM | POA: Insufficient documentation

## 2020-12-22 DIAGNOSIS — F10129 Alcohol abuse with intoxication, unspecified: Secondary | ICD-10-CM | POA: Insufficient documentation

## 2020-12-22 DIAGNOSIS — F10920 Alcohol use, unspecified with intoxication, uncomplicated: Secondary | ICD-10-CM

## 2020-12-22 DIAGNOSIS — Y908 Blood alcohol level of 240 mg/100 ml or more: Secondary | ICD-10-CM | POA: Insufficient documentation

## 2020-12-22 DIAGNOSIS — Z87891 Personal history of nicotine dependence: Secondary | ICD-10-CM | POA: Insufficient documentation

## 2020-12-22 LAB — URINE DRUG SCREEN, QUALITATIVE (ARMC ONLY)
Amphetamines, Ur Screen: NOT DETECTED
Amphetamines, Ur Screen: NOT DETECTED
Barbiturates, Ur Screen: NOT DETECTED
Barbiturates, Ur Screen: NOT DETECTED
Benzodiazepine, Ur Scrn: POSITIVE — AB
Benzodiazepine, Ur Scrn: NOT DETECTED
Cannabinoid 50 Ng, Ur ~~LOC~~: NOT DETECTED
Cannabinoid 50 Ng, Ur ~~LOC~~: NOT DETECTED
Cocaine Metabolite,Ur ~~LOC~~: NOT DETECTED
Cocaine Metabolite,Ur ~~LOC~~: NOT DETECTED
MDMA (Ecstasy)Ur Screen: NOT DETECTED
MDMA (Ecstasy)Ur Screen: NOT DETECTED
Methadone Scn, Ur: NOT DETECTED
Methadone Scn, Ur: NOT DETECTED
Opiate, Ur Screen: NOT DETECTED
Opiate, Ur Screen: NOT DETECTED
Phencyclidine (PCP) Ur S: NOT DETECTED
Phencyclidine (PCP) Ur S: NOT DETECTED
Tricyclic, Ur Screen: NOT DETECTED
Tricyclic, Ur Screen: NOT DETECTED

## 2020-12-22 LAB — CBC WITH DIFFERENTIAL/PLATELET
Abs Immature Granulocytes: 0.01 10*3/uL (ref 0.00–0.07)
Basophils Absolute: 0.1 10*3/uL (ref 0.0–0.1)
Basophils Relative: 2 %
Eosinophils Absolute: 0.1 10*3/uL (ref 0.0–0.5)
Eosinophils Relative: 1 %
HCT: 39.5 % (ref 39.0–52.0)
Hemoglobin: 13.3 g/dL (ref 13.0–17.0)
Immature Granulocytes: 0 %
Lymphocytes Relative: 52 %
Lymphs Abs: 3.4 10*3/uL (ref 0.7–4.0)
MCH: 32.4 pg (ref 26.0–34.0)
MCHC: 33.7 g/dL (ref 30.0–36.0)
MCV: 96.1 fL (ref 80.0–100.0)
Monocytes Absolute: 0.3 10*3/uL (ref 0.1–1.0)
Monocytes Relative: 4 %
Neutro Abs: 2.7 10*3/uL (ref 1.7–7.7)
Neutrophils Relative %: 41 %
Platelets: 294 10*3/uL (ref 150–400)
RBC: 4.11 MIL/uL — ABNORMAL LOW (ref 4.22–5.81)
RDW: 13.4 % (ref 11.5–15.5)
WBC: 6.5 10*3/uL (ref 4.0–10.5)
nRBC: 0 % (ref 0.0–0.2)

## 2020-12-22 LAB — COMPREHENSIVE METABOLIC PANEL
ALT: 22 U/L (ref 0–44)
AST: 29 U/L (ref 15–41)
Albumin: 4.3 g/dL (ref 3.5–5.0)
Alkaline Phosphatase: 48 U/L (ref 38–126)
Anion gap: 11 (ref 5–15)
BUN: 6 mg/dL (ref 6–20)
CO2: 25 mmol/L (ref 22–32)
Calcium: 8.6 mg/dL — ABNORMAL LOW (ref 8.9–10.3)
Chloride: 107 mmol/L (ref 98–111)
Creatinine, Ser: 0.67 mg/dL (ref 0.61–1.24)
GFR, Estimated: >60 mL/min (ref >60)
Glucose, Bld: 137 mg/dL — ABNORMAL HIGH (ref 70–99)
Potassium: 3.5 mmol/L (ref 3.5–5.1)
Sodium: 143 mmol/L (ref 135–145)
Total Bilirubin: 0.5 mg/dL (ref 0.3–1.2)
Total Protein: 7.6 g/dL (ref 6.5–8.1)

## 2020-12-22 LAB — ETHANOL
Alcohol, Ethyl (B): 356 mg/dL (ref <10)
Alcohol, Ethyl (B): 385 mg/dL (ref <10)

## 2020-12-22 MED ORDER — DROPERIDOL 2.5 MG/ML IJ SOLN
5.0000 mg | Freq: Once | INTRAMUSCULAR | Status: AC
  Administered 2020-12-22: 5 mg via INTRAMUSCULAR
  Filled 2020-12-22: qty 2

## 2020-12-22 NOTE — ED Provider Notes (Addendum)
Vitals:   12/22/20 0700 12/22/20 1048  BP: 96/62 111/72  Pulse: 98 (!) 113  Resp: 16 18  Temp: 97.9 F (36.6 C) 97.6 F (36.4 C)  SpO2: 96% 98%    Patient fully alert and oriented.  Normotensive to slightly tachycardic.  He is able to ambulate with stable gait, no slurring of speech.  Does not appear clinically intoxicated at the same time does not show evidence of acute withdrawal aside from just slight tachycardia but no tremulousness or alteration mental status.  He overall appears well and he reports he does not wish to stay to be placed into a detox any longer.  He plans to taper off alcohol over the next several days, and I expressed to him that this is unlikely to be an effective treatment for his alcoholism and I strongly encouraged him to stay for detox placement but he refuses.  He is of sober mind, fully oriented, and we had a frank discussion on risks of alcohol withdrawal (seizure, falls, injury, death), use of alcohol.  He is elected to be discharged.  Also spoke with Dr. Jerolyn Center of infectious diseases patient reports he needs to be reconnected with infectious disease follow-up, I gave him information for our clinic to follow-up or go to on Monday to guide him on appropriate HIV treatments here in [facility], unfortunately patient does not have a cell phone number or address that we can reach him at but I have provided him information to the clinic.  Return precautions and treatment recommendations and follow-up discussed with the patient who is agreeable with the plan.    Sharyn Creamer, MD 12/22/20 1112    Sharyn Creamer, MD 12/22/20 1112

## 2020-12-22 NOTE — ED Provider Notes (Signed)
Emergency Medicine Observation Re-evaluation Note  Gary [name] is a 35 y.o. male, seen on rounds today.  Pt initially presented to the ED for complaints of Alcohol Problem Currently, the patient is resting in the hallway, wakes up to voice.  Reports he came in because of alcohol problems and is interested in detox but does not wish to go through rehabilitation.  Does not want to harm himself or anyone else.  He is currently homeless.  Been dealing with alcoholism for a while.  Denies nausea no tremulousness.  Physical Exam  BP 96/62   Pulse 98   Temp 97.9 F (36.6 C) (Oral)   Resp 16   Ht 6\' 1"  (1.854 m)   Wt 70.3 kg   SpO2 96%   BMI 20.45 kg/m  Physical Exam General: Resting, but alerts to voice, no distress.  Conjunctiva slightly injected bilateral Cardiac: Normal heart rate, resting comfortably pulse about 90 Lungs: Clear normal respirations no cough or distress. Psych: He is calm, in no distress.  Shows no signs of psychomotor agitation. Neuro: No tremors.  Fully alert and oriented.  Speech is clear without slurring  ED Course / MDM  EKG:  Clinical Course as of 12/22/20 0721  Sun Dec 22, 2020  Dec 24, 2020 TTS was unable to evaluate patient due to his intoxication.  They will try again once patient is sober [JS]  0606 Patient remains asleep in the ED.  Pending TTS evaluation once he is awake and sober. [JS]    Clinical Course User Index [JS] 0607, MD   I have reviewed the labs performed to date as well as medications administered while in observation.  Recent changes in the last 24 hours include awaiting TTS consultation.  Plan  Current plan is for TTS consultation for possible detox options. Patient is not under full IVC at this time.   Irean Hong, MD 12/22/20 [phone]

## 2020-12-22 NOTE — BH Assessment (Signed)
This counselor attempted to assess pt however pt was not arousable. TTS to follow up when pt wakes up.

## 2020-12-22 NOTE — BH Assessment (Signed)
Referral information for detox treatment faxed to:   [facility] [phone] or [phone])   [facility] [phone])   [facility] [phone])  . [place] [phone])   [facility] [phone]  . [place] [phone])

## 2020-12-22 NOTE — ED Notes (Signed)
Pt resting in stretcher at this time w/eyes closed.  Pt denies any needs at this time. Will continue to monitor.

## 2020-12-22 NOTE — ED Notes (Signed)
D/C instructions given.  IV catheter removed.  Advised pt to follow up with detox and with HIV clinic, per instructions.  All questions addressed.  Pt verbalized understanding.  Pt left ER ambulatory with steady gait.
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
## 2020-12-22 NOTE — Discharge Instructions (Addendum)
You have been seen in the Emergency Department (ED) today for substance abuse.  You have been evaluated by the behavioral medicine specialists and are being [facility]  Please return to the ED immediately if you have ANY thoughts of hurting yourself or anyone else, so that we may help you.  Please avoid alcohol and drug use.  Follow up with your doctor and/or therapist as soon as possible regarding today's ED  visit.   Please follow up any other recommendations and clinic appointments provided by the psychiatry team that saw you in the Emergency Department.  

## 2020-12-22 NOTE — ED Notes (Signed)
Called for pt in lobby, pt found in bathroom stall standing in the corner, pt said he would be right out then metal/tin noises heard by this tech and Misty Stanley NT.   Pt stated he has extreme anxiety when having alcohol withdrawals.

## 2020-12-22 NOTE — ED Notes (Signed)
Pt dressed out at request of Dr Larinda Buttery due to pt having unknown things in his pockets and bottles of wine in his personal bags. This RN dressed pt out with assistance of two officers due to pt making threatening comments while intoxicated towards this Marine scientist.   Pt belongings include a black bookbag with bottles of wine and a second bag with two brown boots, one green jacket, one patterned shirt, one pair khacki pants, two white socks, one black toboggan. 2/2 bags with labels at nursing station.

## 2020-12-22 NOTE — ED Notes (Signed)
Signed paper copy of d/c.

## 2020-12-22 NOTE — Discharge Instructions (Signed)
In addition to seeking follow-up with outpatient detox, please also call the infectious disease clinic to set up follow-up for your HIV treatment.  No driving today or while using alcohol.

## 2020-12-22 NOTE — ED Notes (Signed)
ETOH verbally stated to Dr Larinda Buttery

## 2020-12-22 NOTE — ED Notes (Signed)
Pt swaying, forgetful, somewhat disoriented. Pt agitated, labile mood. Makes unprofessional advances/passes at this RN. Pt somewhat difficult to redirect. Pt states he has thoughts of hurting people, also states he does not want to act on them. Pt gestures somewhat aggressively, does not physically instigate.   Pt states he feels anxious and says that he wants to take something for it. Pt has not allowed dressing out/taking up belongings at this time. Pt moves bag aside, clanking sounds heard (glass or metal? Bottles?). Pt states he does not want his belongings removed.   Pt does not state any intent to harm self, does not acknowledge and AH/VH. Pt gaze is wandering. Thoughts somewhat incoherent. Does not like security/LEO. Makes faces at them, tells them to "fuck off," or "what the fuck are you looking at?" Sticks his tongue out.   Pt wanders out of room regularly.

## 2020-12-22 NOTE — ED Provider Notes (Signed)
[facility] Emergency Department Provider Note   ____________________________________________   Event Date/Time   First MD Initiated Contact with Patient 12/22/20 1746     (approximate)  I have reviewed the triage vital signs and the nursing notes.   HISTORY  Chief Complaint Alcohol Intoxication    HPI Gary [name] is a 35 y.o. male with past medical history of HIV, alcohol abuse, and polysubstance abuse who presents to the ED for alcohol intoxication.  History is limited due to patient's severe intoxication.  He initially stated he came to the hospital for help with alcohol withdrawal, is unable to state how much he has been drinking or when his last drink was.  He is intermittently agitated, stating "do not fucking talk to me like that."  He denies any suicidal or homicidal ideation.        Past Medical History:  Diagnosis Date  . Alcohol abuse   . HIV (human immunodeficiency virus infection) (HCC)   . Subdural hematoma [facility])     Patient Active Problem List   Diagnosis Date Noted  . 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 Elevated troponin 10/19/2020  . Alcohol withdrawal (HCC) 10/19/2020  . Alcohol abuse   . Alcohol poisoning   . Suicidal ideation 06/14/2020  . Pancreatitis 06/13/2020  . 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  . Malnutrition of moderate degree 05/07/2020  . Gastrointestinal hemorrhage   . Alcoholic hepatitis without ascites   . Melena 05/05/2020  . Fever 05/05/2020  . Alcoholic ketoacidosis 05/05/2020  . Thrombocytopenia (HCC) 07/25/2019  . Transaminitis 07/25/2019  . Diarrhea 07/25/2019  . Traumatic subdural hematoma (HCC) 07/02/2019  . Alcohol abuse with intoxication (HCC) 07/02/2019  . Alcohol abuse with alcohol-induced mood disorder (HCC) 05/13/2019  . Alcohol dependence (HCC) 04/16/2019  . 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 HIV disease (HCC)  06/16/2018  . Polysubstance abuse (HCC) 06/16/2018  . Cigarette smoker 06/16/2018    Past Surgical History:  Procedure Laterality Date  . INCISION AND DRAINAGE PERIRECTAL ABSCESS N/A 09/24/2016   Procedure: IRRIGATION AND DEBRIDEMENT PERIRECTAL ABSCESS;  Surgeon: Ricarda Frame, MD;  Location: [facility] ORS;  Service: General;  Laterality: N/A;  . none      Prior to Admission medications   Medication Sig Start Date End Date Taking? Authorizing Provider  elvitegravir-cobicistat-emtricitabine-tenofovir (GENVOYA) 150-150-200-10 MG TABS tablet Take 1 tablet by mouth daily with breakfast. Patient not taking: No sig reported 09/28/20   Clapacs, Jackquline Denmark, MD  famotidine (PEPCID) 20 MG tablet Take 1 tablet (20 mg total) by mouth 2 (two) times daily. Patient not taking: Reported on 06/01/2019 05/14/19 06/02/19  Benjiman Core, MD    Allergies Tegretol [carbamazepine] and Caffeine  Family History  Problem Relation Age of Onset  . Alcohol abuse Mother   . Alcohol abuse Father     Social History Social History   Tobacco Use  . Smoking status: Former Smoker    Packs/day: 0.50    Types: Cigarettes  . Smokeless tobacco: Never Used  . Tobacco comment: unable to smoke while incarcerated 6+ months 02/13/20  Vaping Use  . Vaping Use: Never used  Substance Use Topics  . Alcohol use: Yes  . Drug use: Not Currently    Types: Marijuana, Methamphetamines    Comment: last used 04/10/2019    Review of Systems Unable to obtain secondary to intoxication  ____________________________________________   PHYSICAL EXAM:  VITAL SIGNS: ED Triage Vitals  Enc Vitals Group  BP 12/22/20 1751 132/83     Pulse Rate 12/22/20 1751 87     Resp 12/22/20 1751 18     Temp 12/22/20 1751 (!) 97.4 F (36.3 C)     Temp Source 12/22/20 1751 Oral     SpO2 12/22/20 1751 100 %     Weight 12/22/20 1730 155 lb (70.3 kg)     Height 12/22/20 1730 6\' 1"  (1.854 m)     Head Circumference --      Peak Flow --       Pain Score 12/22/20 1730 0     Pain Loc --      Pain Edu? --      Excl. in GC? --     Constitutional: Awake and alert, intoxicated appearing. Eyes: Conjunctivae are normal. Head: Atraumatic. Nose: No congestion/rhinnorhea. Mouth/Throat: Mucous membranes are moist. Neck: Normal ROM Cardiovascular: Normal rate, regular rhythm. Grossly normal heart sounds. Respiratory: Normal respiratory effort.  No retractions. Lungs CTAB. Gastrointestinal: Soft and nontender. No distention. Genitourinary: deferred Musculoskeletal: No lower extremity tenderness nor edema. Neurologic:  Normal speech and language. No gross focal neurologic deficits are appreciated. Skin:  Skin is warm, dry and intact. No rash noted. Psychiatric: Mood and affect are normal. Speech and behavior are normal.  ____________________________________________   LABS (all labs ordered are listed, but only abnormal results are displayed)  Labs Reviewed  CBC WITH DIFFERENTIAL/PLATELET - Abnormal; Notable for the following components:      Result Value   RBC 4.11 (*)    All other components within normal limits  COMPREHENSIVE METABOLIC PANEL - Abnormal; Notable for the following components:   Glucose, Bld 137 (*)    Calcium 8.6 (*)    All other components within normal limits  ETHANOL - Abnormal; Notable for the following components:   Alcohol, Ethyl (B) 385 (*)    All other components within normal limits  URINE DRUG SCREEN, QUALITATIVE ([facility] ONLY)     PROCEDURES  Procedure(s) performed (including Critical Care):  Procedures   ____________________________________________   INITIAL IMPRESSION / ASSESSMENT AND PLAN / ED COURSE       35 year old male with past medical history of HIV, alcohol abuse, polysubstance abuse who presents to the ED for alcohol intoxication but is unable to provide coherent history due to his level of intoxication.  He was initially calm and cooperative, allowing for blood work to be  drawn.  Afterwards, patient intermittently agitated and aggressive, acting threateningly towards staff.  He was subsequently medicated with IM droperidol to ensure his safety and the safety of staff.  Labs remarkable for elevated blood alcohol but otherwise reassuring.  We will observe patient in the ED until he is clinically sober but thus far he has denied any psychiatric complaints and there does not appear to be an indication for psychiatric consultation.      ____________________________________________   FINAL CLINICAL IMPRESSION(S) / ED DIAGNOSES  Final diagnoses:  Alcoholic intoxication without complication [facility])     ED Discharge Orders    None       Note:  This document was prepared using Dragon voice recognition software and may include unintentional dictation errors.   [facility], MD 12/22/20 2251
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
## 2020-12-22 NOTE — ED Notes (Signed)
Pt now asleep on bed. Remains in clothes. Respirations even and unlabored.

## 2020-12-23 NOTE — ED Provider Notes (Signed)
-----------------------------------------   6:30 AM on 12/23/2020 -----------------------------------------   Blood pressure (!) 91/52, pulse 93, temperature (!) 97.4 F (36.3 C), temperature source Oral, resp. rate 18, height 6\' 1"  (1.854 m), weight 70.3 kg, SpO2 97 %.  The patient is sleeping at this time.  There have been no acute events since the last update.  Anticipate discharge home once patient is alert and ambulatory.   , MD 12/23/20 0630

## 2020-12-23 NOTE — ED Notes (Signed)
Pt a/o, oob in room without assistance. VS wnl. Pt seen by edp, ok to discharge.

## 2020-12-23 NOTE — ED Notes (Signed)
Patient sleeping at this time, vital signs deferred. 

## 2020-12-23 NOTE — ED Provider Notes (Signed)
Vitals:   12/22/20 2141 12/23/20 0737  BP: (!) 91/52 116/80  Pulse: 93 98  Resp: 18 18  Temp:  98.9 F (37.2 C)  SpO2: 97% 100%     Patient is fully awake and alert, in no distress.  He ambulates with stable and steady gait.  Speech is clear.  Appears appropriate for discharge.  Encouraged him to follow-up with alcohol treatment options.  Patient not expressing any acute psychiatric symptoms such as homicidal or suicidal ideations.  Return precautions and treatment recommendations and follow-up discussed with the patient who is agreeable with the plan.  Also recommended the patient touch base with our infectious disease clinic today as he was planning on I saw him yesterday at discharge.   Sharyn Creamer, MD 12/23/20 [phone]

## 2020-12-23 NOTE — ED Notes (Signed)
Pt VOL/pending detox referral.

## 2020-12-27 ENCOUNTER — Telehealth: Payer: Self-pay

## 2020-12-27 NOTE — Telephone Encounter (Signed)
-----   Message from [name], NP sent at 12/23/2020  2:42 PM EST ----- Regarding: FW: Needs HIV Follow-up Team,   Will you please help me try to get in touch with [place] to get him back into care? Looks like it may be challenging with unstable housing and a phone number that is not valid. Hopefully he will call us soon.   Thank you,  Judeth Cornfield   ----- Message ----- From: Lynn Ito, MD Sent: 12/23/2020  12:30 PM EST To: Sharyn Creamer, MD, [name], NP Subject: RE: Needs HIV Follow-up                        Hi Mark, pt is followed at [facility] [place]. I am including his provider stephanie, so that they can reach out to him. ----- Message ----- From: Sharyn Creamer, MD Sent: 12/22/2020  11:09 AM EST To: Judyann Munson, MD, Lynn Ito, MD Subject: Needs HIV Follow-up                            Patient affirms he has no contact point or cell phone. Homeless. I did give him ID Clinic phone number to call tomorrow. He reports has HIV but off meds, but was particpating in [place] with an HIV clinic through "[employer]" program. If he calls or reaches out, please see if you can guide him to appropriate HIV care. Thank you

## 2020-12-27 NOTE — Telephone Encounter (Signed)
If patient calls, please connect him to care with a male provider.   Sandie Ano, RN

## 2020-12-28 ENCOUNTER — Encounter: Payer: Self-pay | Admitting: Emergency Medicine

## 2020-12-28 ENCOUNTER — Emergency Department
Admission: EM | Admit: 2020-12-28 | Discharge: 2020-12-28 | Disposition: A | Discharge status: Home / Self Care | Payer: Self-pay | Attending: Emergency Medicine | Admitting: Emergency Medicine

## 2020-12-28 ENCOUNTER — Other Ambulatory Visit: Payer: Self-pay

## 2020-12-28 DIAGNOSIS — F1010 Alcohol abuse, uncomplicated: Secondary | ICD-10-CM | POA: Insufficient documentation

## 2020-12-28 DIAGNOSIS — Z21 Asymptomatic human immunodeficiency virus [HIV] infection status: Secondary | ICD-10-CM | POA: Insufficient documentation

## 2020-12-28 DIAGNOSIS — Z87891 Personal history of nicotine dependence: Secondary | ICD-10-CM | POA: Insufficient documentation

## 2020-12-28 LAB — CBC WITH DIFFERENTIAL/PLATELET
Abs Immature Granulocytes: 0.01 10*3/uL (ref 0.00–0.07)
Basophils Absolute: 0 10*3/uL (ref 0.0–0.1)
Basophils Relative: 1 %
Eosinophils Absolute: 0.2 10*3/uL (ref 0.0–0.5)
Eosinophils Relative: 4 %
HCT: 41.1 % (ref 39.0–52.0)
Hemoglobin: 14.1 g/dL (ref 13.0–17.0)
Immature Granulocytes: 0 %
Lymphocytes Relative: 63 %
Lymphs Abs: 2.9 10*3/uL (ref 0.7–4.0)
MCH: 32.5 pg (ref 26.0–34.0)
MCHC: 34.3 g/dL (ref 30.0–36.0)
MCV: 94.7 fL (ref 80.0–100.0)
Monocytes Absolute: 0.2 10*3/uL (ref 0.1–1.0)
Monocytes Relative: 4 %
Neutro Abs: 1.3 10*3/uL — ABNORMAL LOW (ref 1.7–7.7)
Neutrophils Relative %: 28 %
Platelets: 275 10*3/uL (ref 150–400)
RBC: 4.34 MIL/uL (ref 4.22–5.81)
RDW: 13.4 % (ref 11.5–15.5)
WBC: 4.6 10*3/uL (ref 4.0–10.5)
nRBC: 0 % (ref 0.0–0.2)

## 2020-12-28 LAB — BASIC METABOLIC PANEL
Anion gap: 11 (ref 5–15)
BUN: 8 mg/dL (ref 6–20)
CO2: 27 mmol/L (ref 22–32)
Calcium: 8.1 mg/dL — ABNORMAL LOW (ref 8.9–10.3)
Chloride: 104 mmol/L (ref 98–111)
Creatinine, Ser: 0.67 mg/dL (ref 0.61–1.24)
GFR, Estimated: >60 mL/min (ref >60)
Glucose, Bld: 97 mg/dL (ref 70–99)
Potassium: 3.4 mmol/L — ABNORMAL LOW (ref 3.5–5.1)
Sodium: 142 mmol/L (ref 135–145)

## 2020-12-28 LAB — ETHANOL: Alcohol, Ethyl (B): 431 mg/dL (ref <10)

## 2020-12-28 MED ORDER — LORAZEPAM 2 MG PO TABS: 0.0000 mg | ORAL_TABLET | Freq: Two times a day (BID) | ORAL | Status: DC

## 2020-12-28 MED ORDER — LORAZEPAM 2 MG PO TABS
0.0000 mg | ORAL_TABLET | Freq: Four times a day (QID) | ORAL | Status: DC
  Administered 2020-12-28: 2 mg via ORAL
  Filled 2020-12-28: qty 1

## 2020-12-28 MED ORDER — LORAZEPAM 2 MG/ML IJ SOLN: 0.0000 mg | Freq: Two times a day (BID) | INTRAMUSCULAR | Status: DC

## 2020-12-28 MED ORDER — LORAZEPAM 2 MG/ML IJ SOLN: 0.0000 mg | Freq: Four times a day (QID) | INTRAMUSCULAR | Status: DC

## 2020-12-28 NOTE — ED Notes (Signed)
Pt sleeping with even and unlabored resp, no distress noted, pt covered with warm blanket

## 2020-12-28 NOTE — ED Notes (Signed)
Pt denies SI or HI or hallucinations, pt reports daily ETOH abuse, pt known to this RN for hx of the same, pt requests this RN to help and when oriented to past efforts to help being met with pt sobering and then leaving, pt reports "you're strong enough - make me stay", pt oriented to his responsibility in treatment must be of his of volition

## 2020-12-28 NOTE — ED Triage Notes (Signed)
Patient from [employer] rest bathroom via [facility] with complaints of ETOH problem, per pt "I want help with alcohol, I'm drinking myself to death"  Per EMS pt is resistant to interventions and angry  Pt raises hands as if to strike this RN with any interventions despite repeatedly asking for permissions to help pt, pt oriented to his request for help then pt apologizes

## 2020-12-28 NOTE — ED Provider Notes (Signed)
[facility] Emergency Department Provider Note ____________________________________________   Event Date/Time   First MD Initiated Contact with Patient 12/28/20 [phone]     (approximate)  I have reviewed the triage vital signs and the nursing notes.   HISTORY  Chief Complaint Alcohol Intoxication and request for detox  Level 5 caveat: History of present illness limited due to alcohol intoxication  HPI Gary [name] is a 35 y.o. male with PMH as noted below who presents after he was found in a [employer] bathroom intoxicated.  The patient told the RN that he was "drinking himself to death" and wanted help.  To me, the patient states that he is in alcohol withdrawal but he clearly still appears intoxicated.  He denies other acute complaints.  Past Medical History:  Diagnosis Date  . Alcohol abuse   . HIV (human immunodeficiency virus infection) (HCC)   . Subdural hematoma [facility])     Patient Active Problem List   Diagnosis Date Noted  . 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 Elevated troponin 10/19/2020  . Alcohol withdrawal (HCC) 10/19/2020  . Alcohol abuse   . Alcohol poisoning   . Suicidal ideation 06/14/2020  . Pancreatitis 06/13/2020  . 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  . Malnutrition of moderate degree 05/07/2020  . Gastrointestinal hemorrhage   . Alcoholic hepatitis without ascites   . Melena 05/05/2020  . Fever 05/05/2020  . Alcoholic ketoacidosis 05/05/2020  . Thrombocytopenia (HCC) 07/25/2019  . Transaminitis 07/25/2019  . Diarrhea 07/25/2019  . Traumatic subdural hematoma (HCC) 07/02/2019  . Alcohol abuse with intoxication (HCC) 07/02/2019  . Alcohol abuse with alcohol-induced mood disorder (HCC) 05/13/2019  . Alcohol dependence (HCC) 04/16/2019  . 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 HIV disease (HCC) 06/16/2018  . Polysubstance abuse (HCC) 06/16/2018  . Cigarette  smoker 06/16/2018    Past Surgical History:  Procedure Laterality Date  . INCISION AND DRAINAGE PERIRECTAL ABSCESS N/A 09/24/2016   Procedure: IRRIGATION AND DEBRIDEMENT PERIRECTAL ABSCESS;  Surgeon: Ricarda Frame, MD;  Location: [facility] ORS;  Service: General;  Laterality: N/A;  . none      Prior to Admission medications   Medication Sig Start Date End Date Taking? Authorizing Provider  elvitegravir-cobicistat-emtricitabine-tenofovir (GENVOYA) 150-150-200-10 MG TABS tablet Take 1 tablet by mouth daily with breakfast. Patient not taking: No sig reported 09/28/20   Clapacs, Jackquline Denmark, MD  famotidine (PEPCID) 20 MG tablet Take 1 tablet (20 mg total) by mouth 2 (two) times daily. Patient not taking: Reported on 06/01/2019 05/14/19 06/02/19  Benjiman Core, MD    Allergies Tegretol [carbamazepine] and Caffeine  Family History  Problem Relation Age of Onset  . Alcohol abuse Mother   . Alcohol abuse Father     Social History Social History   Tobacco Use  . Smoking status: Former Smoker    Packs/day: 0.50    Types: Cigarettes  . Smokeless tobacco: Never Used  . Tobacco comment: unable to smoke while incarcerated 6+ months 02/13/20  Vaping Use  . Vaping Use: Never used  Substance Use Topics  . Alcohol use: Yes    Comment: "I drink all I can"  . Drug use: Not Currently    Types: Marijuana, Methamphetamines    Comment: last used 04/10/2019    Review of Systems Level 5 caveat: Unable to obtain review of systems due to alcohol intoxication    ____________________________________________   PHYSICAL EXAM:  VITAL SIGNS: ED Triage Vitals  Enc Vitals  Group     BP      Pulse      Resp      Temp      Temp src      SpO2      Weight      Height      Head Circumference      Peak Flow      Pain Score      Pain Loc      Pain Edu?      Excl. in GC?     Constitutional: Somnolent but arousable. Eyes: Conjunctivae are normal.  EOMI.  PERRLA. Head: Atraumatic. Nose: No  congestion/rhinnorhea. Mouth/Throat: Mucous membranes are moist.   Neck: Normal range of motion.  Cardiovascular: Normal rate, regular rhythm.   Good peripheral circulation. Respiratory: Normal respiratory effort.  No retractions.  Gastrointestinal: No distention.  Musculoskeletal: Extremities warm and well perfused.  Neurologic: Slurred speech.  Motor intact in all extremities.  No tongue fasciculation or tremor. Skin:  Skin is warm and dry. No rash noted. Psychiatric: Unable to assess due to intoxication.  ____________________________________________   LABS (all labs ordered are listed, but only abnormal results are displayed)  Labs Reviewed  BASIC METABOLIC PANEL - Abnormal; Notable for the following components:      Result Value   Potassium 3.4 (*)    Calcium 8.1 (*)    All other components within normal limits  CBC WITH DIFFERENTIAL/PLATELET - Abnormal; Notable for the following components:   Neutro Abs 1.3 (*)    All other components within normal limits  ETHANOL - Abnormal; Notable for the following components:   Alcohol, Ethyl (B) 431 (*)    All other components within normal limits  URINE DRUG SCREEN, QUALITATIVE ([facility] ONLY)   ____________________________________________  EKG   ____________________________________________  RADIOLOGY    ____________________________________________   PROCEDURES  Procedure(s) performed: No  Procedures  Critical Care performed: No ____________________________________________   INITIAL IMPRESSION / ASSESSMENT AND PLAN / ED COURSE  Pertinent labs & imaging results that were available during my care of the patient were reviewed by me and considered in my medical decision making (see chart for details).  35 year old male with a history of alcohol abuse and numerous prior visits to this ED presents with apparent alcohol intoxication after he was found in a [employer] bathroom.  I reviewed the past medical records in epic.   He was last in the ED last week due to alcohol intoxication.  At that time he had some altercations with staff and police.  He was initially awaiting detox referral but eventually was discharged home.  On exam today, the patient is somnolent but arousable.  He appears significantly intoxicated.  He is able to answer some questions and can follow commands.  There is no visible trauma.  Motor function is intact in all extremities.  Overall presentation is consistent with alcohol intoxication.  At this time there is no indication for psychiatric evaluation.  We will observe to sobriety and then reassess the need for detox referral or psychiatric evaluation based on the patient's symptoms.  ----------------------------------------- 6:49 AM on 12/28/2020 -----------------------------------------  The patient has been sleeping for the last several hours.  He is still intoxicated.  I will sign the patient out to the oncoming ED physician at 7 AM.  CIWA protocol has been ordered. ____________________________________________   FINAL CLINICAL IMPRESSION(S) / ED DIAGNOSES  Final diagnoses:  Alcohol abuse      NEW MEDICATIONS STARTED DURING THIS  VISIT:  New Prescriptions   No medications on file     Note:  This document was prepared using Dragon voice recognition software and may include unintentional dictation errors.   Dionne Bucy, MD 12/28/20 [phone]

## 2020-12-28 NOTE — ED Provider Notes (Signed)
Patient is clinically sober and asking to leave, he has decisional capacity.   Jene Every, MD 12/28/20 1115

## 2020-12-28 NOTE — ED Notes (Signed)
CRITICAL LAB: ETOH is 431, [employer], Dr. Marisa Severin notified, orders received

## 2020-12-28 NOTE — ED Notes (Signed)
Pt able to ambulate to lobby independently.

## 2020-12-28 NOTE — ED Notes (Signed)
Based off prior visits that found pt hiding ETOH and drinking on premises EDP want pt dressed into behavioral scrubs, this RN unable to get pt to wake for more than a few moments, pt establishes eye contact then falls back asleep, pt jacket, pants and backpack searched for ETOH, Dr Marisa Severin reports no delay change out of pt into behavioral scrubs

## 2020-12-28 NOTE — ED Notes (Signed)
Pt offered meal tray, patient refused.

## 2020-12-29 ENCOUNTER — Emergency Department
Admission: EM | Admit: 2020-12-29 | Discharge: 2020-12-30 | Disposition: A | Discharge status: Home / Self Care | Payer: Self-pay | Attending: Emergency Medicine | Admitting: Emergency Medicine

## 2020-12-29 DIAGNOSIS — F10920 Alcohol use, unspecified with intoxication, uncomplicated: Secondary | ICD-10-CM

## 2020-12-29 DIAGNOSIS — Z21 Asymptomatic human immunodeficiency virus [HIV] infection status: Secondary | ICD-10-CM | POA: Insufficient documentation

## 2020-12-29 DIAGNOSIS — Z87891 Personal history of nicotine dependence: Secondary | ICD-10-CM | POA: Insufficient documentation

## 2020-12-29 DIAGNOSIS — F10129 Alcohol abuse with intoxication, unspecified: Secondary | ICD-10-CM | POA: Insufficient documentation

## 2020-12-29 DIAGNOSIS — K2920 Alcoholic gastritis without bleeding: Secondary | ICD-10-CM | POA: Insufficient documentation

## 2020-12-29 DIAGNOSIS — Y908 Blood alcohol level of 240 mg/100 ml or more: Secondary | ICD-10-CM | POA: Insufficient documentation

## 2020-12-29 LAB — COMPREHENSIVE METABOLIC PANEL
ALT: 29 U/L (ref 0–44)
AST: 47 U/L — ABNORMAL HIGH (ref 15–41)
Albumin: 4.9 g/dL (ref 3.5–5.0)
Alkaline Phosphatase: 57 U/L (ref 38–126)
Anion gap: 14 (ref 5–15)
BUN: 5 mg/dL — ABNORMAL LOW (ref 6–20)
CO2: 26 mmol/L (ref 22–32)
Calcium: 8.8 mg/dL — ABNORMAL LOW (ref 8.9–10.3)
Chloride: 101 mmol/L (ref 98–111)
Creatinine, Ser: 0.56 mg/dL — ABNORMAL LOW (ref 0.61–1.24)
GFR, Estimated: >60 mL/min (ref >60)
Glucose, Bld: 76 mg/dL (ref 70–99)
Potassium: 4.2 mmol/L (ref 3.5–5.1)
Sodium: 141 mmol/L (ref 135–145)
Total Bilirubin: 1 mg/dL (ref 0.3–1.2)
Total Protein: 8.4 g/dL — ABNORMAL HIGH (ref 6.5–8.1)

## 2020-12-29 LAB — CBC
HCT: 43.9 % (ref 39.0–52.0)
Hemoglobin: 15.3 g/dL (ref 13.0–17.0)
MCH: 32.7 pg (ref 26.0–34.0)
MCHC: 34.9 g/dL (ref 30.0–36.0)
MCV: 93.8 fL (ref 80.0–100.0)
Platelets: 266 10*3/uL (ref 150–400)
RBC: 4.68 MIL/uL (ref 4.22–5.81)
RDW: 13.2 % (ref 11.5–15.5)
WBC: 6.4 10*3/uL (ref 4.0–10.5)
nRBC: 0 % (ref 0.0–0.2)

## 2020-12-29 LAB — URINE DRUG SCREEN, QUALITATIVE (ARMC ONLY)
Amphetamines, Ur Screen: NOT DETECTED
Barbiturates, Ur Screen: NOT DETECTED
Benzodiazepine, Ur Scrn: NOT DETECTED
Cannabinoid 50 Ng, Ur ~~LOC~~: NOT DETECTED
Cocaine Metabolite,Ur ~~LOC~~: NOT DETECTED
MDMA (Ecstasy)Ur Screen: NOT DETECTED
Methadone Scn, Ur: NOT DETECTED
Opiate, Ur Screen: NOT DETECTED
Phencyclidine (PCP) Ur S: NOT DETECTED
Tricyclic, Ur Screen: NOT DETECTED

## 2020-12-29 LAB — LIPASE, BLOOD: Lipase: 47 U/L (ref 11–51)

## 2020-12-29 LAB — ETHANOL: Alcohol, Ethyl (B): 425 mg/dL (ref <10)

## 2020-12-29 MED ORDER — FAMOTIDINE 20 MG PO TABS
40.0000 mg | ORAL_TABLET | Freq: Once | ORAL | Status: DC
  Filled 2020-12-29: qty 2

## 2020-12-29 MED ORDER — THIAMINE HCL 100 MG/ML IJ SOLN: 100.0000 mg | Freq: Every day | INTRAMUSCULAR | Status: DC

## 2020-12-29 MED ORDER — LORAZEPAM 2 MG/ML IJ SOLN: 0.0000 mg | Freq: Two times a day (BID) | INTRAMUSCULAR | Status: DC

## 2020-12-29 MED ORDER — LORAZEPAM 2 MG PO TABS: 0.0000 mg | ORAL_TABLET | Freq: Two times a day (BID) | ORAL | Status: DC

## 2020-12-29 MED ORDER — LORAZEPAM 2 MG/ML IJ SOLN: 0.0000 mg | Freq: Four times a day (QID) | INTRAMUSCULAR | Status: DC

## 2020-12-29 MED ORDER — ALUM & MAG HYDROXIDE-SIMETH 200-200-20 MG/5ML PO SUSP: 30.0000 mL | Freq: Four times a day (QID) | ORAL | Status: DC | PRN

## 2020-12-29 MED ORDER — ALUM & MAG HYDROXIDE-SIMETH 200-200-20 MG/5ML PO SUSP
30.0000 mL | Freq: Once | ORAL | Status: DC
  Filled 2020-12-29: qty 30

## 2020-12-29 MED ORDER — LORAZEPAM 2 MG PO TABS
0.0000 mg | ORAL_TABLET | Freq: Four times a day (QID) | ORAL | Status: DC
  Administered 2020-12-29 – 2020-12-30 (×2): 2 mg via ORAL
  Filled 2020-12-29 (×2): qty 1

## 2020-12-29 MED ORDER — THIAMINE HCL 100 MG PO TABS
100.0000 mg | ORAL_TABLET | Freq: Every day | ORAL | Status: DC
  Administered 2020-12-29: 100 mg via ORAL
  Filled 2020-12-29: qty 1

## 2020-12-29 MED ORDER — ONDANSETRON 4 MG PO TBDP
8.0000 mg | ORAL_TABLET | Freq: Once | ORAL | Status: AC
  Administered 2020-12-29: 8 mg via ORAL
  Filled 2020-12-29: qty 2

## 2020-12-29 NOTE — ED Notes (Signed)
Hourly rounding completed at this time, patient currently asleep in hallway bed. No complaints, stable, and in no acute distress. Q15 minute rounds and monitoring via Rover and Officer to continue. 

## 2020-12-29 NOTE — ED Triage Notes (Signed)
Pt presents via EMS requesting ETOH detox. EMS reports pt seen several time over the last few days with same complaints. EMS reports ETOH intake just prior to arrival.

## 2020-12-29 NOTE — ED Notes (Signed)
RN approached patient to ask how he was feeling after receiving Zofran.    Pt began to twitch  (arms only) and stated, "I'm having a seizure."    EDP made aware.

## 2020-12-29 NOTE — ED Notes (Signed)
Critical Value:  ETOH 425  Dr. Scotty Court aware

## 2020-12-29 NOTE — ED Provider Notes (Signed)
[facility] Emergency Department Provider Note  ____________________________________________  Time seen: Approximately 5:22 PM  I have reviewed the triage vital signs and the nursing notes.   HISTORY  Chief Complaint Alcohol Problem    Level 5 Caveat: Portions of the History and Physical including HPI and review of systems are unable to be completely obtained due to patient intoxicated  HPI Gary [name] is a 35 y.o. male with a history of HIV, subdural hematoma, amphetamine abuse, and alcohol abuse and dependence who comes to the ED complaining of abdominal pain, worried that he might suffer alcohol withdrawal.  He drinks about 6 bottles of wine daily.  Today he has had 3 bottles of wine, states that he started drinking when he woke up as per his usual routine.  However, as the day went on he started having upper abdominal pain and feeling like he could not continue drinking.  Denies any shakes or seizures, no hallucinations.  Not agitated.  Wants to eat a biscuit that he brought with him.  Denies SI HI or hallucinations at the present time.  To me he does not express interest in detox after I brought it up and tried to engage about past detox history and current motivation  Denies any other drug use.Marland Kitchen    Past Medical History:  Diagnosis Date  . Alcohol abuse   . HIV (human immunodeficiency virus infection) (HCC)   . Subdural hematoma [facility])      Patient Active Problem List   Diagnosis Date Noted  . 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 Elevated troponin 10/19/2020  . Alcohol withdrawal (HCC) 10/19/2020  . Alcohol abuse   . Alcohol poisoning   . Suicidal ideation 06/14/2020  . Pancreatitis 06/13/2020  . 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  . Malnutrition of moderate degree 05/07/2020  . Gastrointestinal hemorrhage   . Alcoholic hepatitis without ascites   . Melena 05/05/2020  . Fever 05/05/2020  . Alcoholic ketoacidosis 05/05/2020   . Thrombocytopenia (HCC) 07/25/2019  . Transaminitis 07/25/2019  . Diarrhea 07/25/2019  . Traumatic subdural hematoma (HCC) 07/02/2019  . Alcohol abuse with intoxication (HCC) 07/02/2019  . Alcohol abuse with alcohol-induced mood disorder (HCC) 05/13/2019  . Alcohol dependence (HCC) 04/16/2019  . 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 HIV disease (HCC) 06/16/2018  . Polysubstance abuse (HCC) 06/16/2018  . Cigarette smoker 06/16/2018     Past Surgical History:  Procedure Laterality Date  . INCISION AND DRAINAGE PERIRECTAL ABSCESS N/A 09/24/2016   Procedure: IRRIGATION AND DEBRIDEMENT PERIRECTAL ABSCESS;  Surgeon: Ricarda Frame, MD;  Location: [facility] ORS;  Service: General;  Laterality: N/A;  . none       Prior to Admission medications   Medication Sig Start Date End Date Taking? Authorizing Provider  elvitegravir-cobicistat-emtricitabine-tenofovir (GENVOYA) 150-150-200-10 MG TABS tablet Take 1 tablet by mouth daily with breakfast. Patient not taking: No sig reported 09/28/20   Clapacs, Jackquline Denmark, MD  famotidine (PEPCID) 20 MG tablet Take 1 tablet (20 mg total) by mouth 2 (two) times daily. Patient not taking: Reported on 06/01/2019 05/14/19 06/02/19  Benjiman Core, MD     Allergies Tegretol [carbamazepine] and Caffeine   Family History  Problem Relation Age of Onset  . Alcohol abuse Mother   . Alcohol abuse Father     Social History Social History   Tobacco Use  . Smoking status: Former Smoker    Packs/day: 0.50    Types: Cigarettes  . Smokeless tobacco:  Never Used  . Tobacco comment: unable to smoke while incarcerated 6+ months 02/13/20  Vaping Use  . Vaping Use: Never used  Substance Use Topics  . Alcohol use: Yes    Comment: "I drink all I can"  . Drug use: Not Currently    Types: Marijuana, Methamphetamines    Comment: last used 04/10/2019    Review of Systems Level 5 Caveat: Portions of the History and  Physical including HPI and review of systems are unable to be completely obtained due to patient being a poor historian   Constitutional:   No known fever.  ENT:   No rhinorrhea. Cardiovascular:   No chest pain or syncope. Respiratory:   No dyspnea or cough. Gastrointestinal:   Positive for upper abdominal pain without vomiting and diarrhea.  Musculoskeletal:   Negative for focal pain or swelling ____________________________________________   PHYSICAL EXAM:  VITAL SIGNS: ED Triage Vitals  Enc Vitals Group     BP      Pulse      Resp      Temp      Temp src      SpO2      Weight      Height      Head Circumference      Peak Flow      Pain Score      Pain Loc      Pain Edu?      Excl. in GC?     Vital signs reviewed, nursing assessments reviewed.   Constitutional:   Alert and oriented. Non-toxic appearance. Eyes:   Conjunctivae are injected.  Nonicteric.  Not pale. EOMI. PERRL. ENT      Head:   Normocephalic and atraumatic.      Nose:   No congestion/rhinnorhea.       Mouth/Throat:   MMM, no pharyngeal erythema. No peritonsillar mass.       Neck:   No meningismus. Full ROM. Hematological/Lymphatic/Immunilogical:   No cervical lymphadenopathy. Cardiovascular:   RRR. Symmetric bilateral radial and DP pulses.  No murmurs. Cap refill less than 2 seconds. Respiratory:   Normal respiratory effort without tachypnea/retractions. Breath sounds are clear and equal bilaterally. No wheezes/rales/rhonchi. Gastrointestinal:   Soft and nontender. Non distended. There is no CVA tenderness.  No rebound, rigidity, or guarding.  Musculoskeletal:   Normal range of motion in all extremities. No joint effusions.  No lower extremity tenderness.  No edema. Neurologic:   Slurred speech Motor grossly intact. No acute focal neurologic deficits are appreciated.  Skin:    Skin is warm, dry and intact. No rash noted.  No petechiae, purpura, or  bullae.  ____________________________________________    LABS (pertinent positives/negatives) (all labs ordered are listed, but only abnormal results are displayed) Labs Reviewed  CBC  COMPREHENSIVE METABOLIC PANEL  ETHANOL  URINE DRUG SCREEN, QUALITATIVE ([facility] ONLY)  LIPASE, BLOOD   ____________________________________________   EKG    ____________________________________________    RADIOLOGY  No results found.  ____________________________________________   PROCEDURES Procedures  ____________________________________________    CLINICAL IMPRESSION / ASSESSMENT AND PLAN / ED COURSE  Medications ordered in the ED: Medications - No data to display  Pertinent labs & imaging results that were available during my care of the patient were reviewed by me and considered in my medical decision making (see chart for details).   POSEY PETRIK was evaluated in Emergency Department on 12/29/2020 for the symptoms described in the history of present illness. He was evaluated in  the context of the global COVID-19 pandemic, which necessitated consideration that the patient might be at risk for infection with the SARS-CoV-2 virus that causes COVID-19. Institutional protocols and algorithms that pertain to the evaluation of patients at risk for COVID-19 are in a state of rapid change based on information released by regulatory bodies including the CDC and federal and state organizations. These policies and algorithms were followed during the patient's care in the ED.   Patient presents with clinically apparent alcohol intoxication.  Upper abdominal pain likely due to alcoholic gastritis, will check labs for signs of pancreatitis, give symptomatic relief with Pepcid and Zofran.  Will observe in ED until clinically sober and able to care for himself at which point he can be discharged.      ____________________________________________   FINAL CLINICAL IMPRESSION(S) / ED  DIAGNOSES    Final diagnoses:  Alcoholic intoxication without complication (HCC)  Alcoholic gastritis without bleeding, unspecified chronicity     ED Discharge Orders    None      Portions of this note were generated with dragon dictation software. Dictation errors may occur despite best attempts at proofreading.   Sharman Cheek, MD 12/29/20 1725

## 2020-12-29 NOTE — ED Notes (Signed)
Hourly rounding completed at this time, patient currently awake in hallway bed. No complaints, stable, and in no acute distress. Q15 minute rounds and monitoring via Rover and Officer to continue. 

## 2020-12-29 NOTE — ED Notes (Signed)
Report received from Amy, RN including Situation, Background, Assessment, and Recommendations. Patient alert and oriented, warm and dry, and in no acute distress. Patient denies SI, HI, AVH and pain. Patient made aware of Q15 minute rounds and Rover and Officer presence for their safety. Patient instructed to come to this nurse with needs or concerns. 

## 2020-12-29 NOTE — ED Notes (Signed)
Hourly rounding completed at this time, patient currently asleep in hallway bed. No complaints, stable, and in no acute distress. Q15 minute rounds and monitoring via [employer] to continue.

## 2020-12-29 NOTE — ED Notes (Signed)
Pt presents with slurred speech, trouble forming sentences, obviously under the influence of alcohol. Pt laying in bed, appreciative at this time.

## 2020-12-30 ENCOUNTER — Emergency Department
Admission: EM | Admit: 2020-12-30 | Discharge: 2020-12-31 | Disposition: A | Discharge status: Home / Self Care | Payer: Self-pay | Attending: Emergency Medicine | Admitting: Emergency Medicine

## 2020-12-30 ENCOUNTER — Other Ambulatory Visit: Payer: Self-pay

## 2020-12-30 DIAGNOSIS — F1020 Alcohol dependence, uncomplicated: Secondary | ICD-10-CM | POA: Diagnosis present

## 2020-12-30 DIAGNOSIS — F1094 Alcohol use, unspecified with alcohol-induced mood disorder: Secondary | ICD-10-CM | POA: Diagnosis present

## 2020-12-30 DIAGNOSIS — F10921 Alcohol use, unspecified with intoxication delirium: Secondary | ICD-10-CM | POA: Diagnosis present

## 2020-12-30 DIAGNOSIS — F10229 Alcohol dependence with intoxication, unspecified: Secondary | ICD-10-CM | POA: Diagnosis present

## 2020-12-30 DIAGNOSIS — F10239 Alcohol dependence with withdrawal, unspecified: Secondary | ICD-10-CM | POA: Diagnosis present

## 2020-12-30 DIAGNOSIS — F1010 Alcohol abuse, uncomplicated: Secondary | ICD-10-CM | POA: Diagnosis present

## 2020-12-30 DIAGNOSIS — F1014 Alcohol abuse with alcohol-induced mood disorder: Secondary | ICD-10-CM | POA: Diagnosis present

## 2020-12-30 DIAGNOSIS — R45851 Suicidal ideations: Secondary | ICD-10-CM | POA: Insufficient documentation

## 2020-12-30 DIAGNOSIS — Z87891 Personal history of nicotine dependence: Secondary | ICD-10-CM | POA: Insufficient documentation

## 2020-12-30 DIAGNOSIS — F10129 Alcohol abuse with intoxication, unspecified: Secondary | ICD-10-CM | POA: Diagnosis present

## 2020-12-30 DIAGNOSIS — F10920 Alcohol use, unspecified with intoxication, uncomplicated: Secondary | ICD-10-CM | POA: Diagnosis present

## 2020-12-30 DIAGNOSIS — F10139 Alcohol abuse with withdrawal, unspecified: Secondary | ICD-10-CM | POA: Diagnosis present

## 2020-12-30 DIAGNOSIS — K7010 Alcoholic hepatitis without ascites: Secondary | ICD-10-CM | POA: Insufficient documentation

## 2020-12-30 DIAGNOSIS — F1024 Alcohol dependence with alcohol-induced mood disorder: Secondary | ICD-10-CM | POA: Insufficient documentation

## 2020-12-30 DIAGNOSIS — F10929 Alcohol use, unspecified with intoxication, unspecified: Secondary | ICD-10-CM | POA: Diagnosis present

## 2020-12-30 LAB — COMPREHENSIVE METABOLIC PANEL
ALT: 33 U/L (ref 0–44)
AST: 50 U/L — ABNORMAL HIGH (ref 15–41)
Albumin: 4.6 g/dL (ref 3.5–5.0)
Alkaline Phosphatase: 55 U/L (ref 38–126)
Anion gap: 12 (ref 5–15)
BUN: 6 mg/dL (ref 6–20)
CO2: 26 mmol/L (ref 22–32)
Calcium: 9 mg/dL (ref 8.9–10.3)
Chloride: 101 mmol/L (ref 98–111)
Creatinine, Ser: 0.68 mg/dL (ref 0.61–1.24)
GFR, Estimated: >60 mL/min (ref >60)
Glucose, Bld: 78 mg/dL (ref 70–99)
Potassium: 3.7 mmol/L (ref 3.5–5.1)
Sodium: 139 mmol/L (ref 135–145)
Total Bilirubin: 0.9 mg/dL (ref 0.3–1.2)
Total Protein: 8.2 g/dL — ABNORMAL HIGH (ref 6.5–8.1)

## 2020-12-30 LAB — CBC WITH DIFFERENTIAL/PLATELET
Abs Immature Granulocytes: 0 10*3/uL (ref 0.00–0.07)
Basophils Absolute: 0 10*3/uL (ref 0.0–0.1)
Basophils Relative: 1 %
Eosinophils Absolute: 0.1 10*3/uL (ref 0.0–0.5)
Eosinophils Relative: 2 %
HCT: 40.9 % (ref 39.0–52.0)
Hemoglobin: 14.2 g/dL (ref 13.0–17.0)
Immature Granulocytes: 0 %
Lymphocytes Relative: 48 %
Lymphs Abs: 2.5 10*3/uL (ref 0.7–4.0)
MCH: 32.4 pg (ref 26.0–34.0)
MCHC: 34.7 g/dL (ref 30.0–36.0)
MCV: 93.4 fL (ref 80.0–100.0)
Monocytes Absolute: 0.2 10*3/uL (ref 0.1–1.0)
Monocytes Relative: 4 %
Neutro Abs: 2.3 10*3/uL (ref 1.7–7.7)
Neutrophils Relative %: 45 %
Platelets: 198 10*3/uL (ref 150–400)
RBC: 4.38 MIL/uL (ref 4.22–5.81)
RDW: 13.3 % (ref 11.5–15.5)
WBC: 5.2 10*3/uL (ref 4.0–10.5)
nRBC: 0 % (ref 0.0–0.2)

## 2020-12-30 LAB — ETHANOL: Alcohol, Ethyl (B): 359 mg/dL (ref <10)

## 2020-12-30 MED ORDER — LORAZEPAM 2 MG PO TABS: 0.0000 mg | ORAL_TABLET | Freq: Two times a day (BID) | ORAL | Status: DC

## 2020-12-30 MED ORDER — LORAZEPAM 2 MG/ML IJ SOLN: 0.0000 mg | Freq: Two times a day (BID) | INTRAMUSCULAR | Status: DC

## 2020-12-30 MED ORDER — ONDANSETRON 4 MG PO TBDP
4.0000 mg | ORAL_TABLET | Freq: Once | ORAL | Status: AC
  Administered 2020-12-30: 4 mg via ORAL
  Filled 2020-12-30: qty 1

## 2020-12-30 MED ORDER — LORAZEPAM 2 MG PO TABS: 0.0000 mg | ORAL_TABLET | Freq: Four times a day (QID) | ORAL | Status: DC

## 2020-12-30 MED ORDER — THIAMINE HCL 100 MG PO TABS: 100.0000 mg | ORAL_TABLET | Freq: Every day | ORAL | Status: DC

## 2020-12-30 MED ORDER — THIAMINE HCL 100 MG/ML IJ SOLN: 100.0000 mg | Freq: Every day | INTRAMUSCULAR | Status: DC

## 2020-12-30 MED ORDER — LORAZEPAM 2 MG/ML IJ SOLN
0.0000 mg | Freq: Four times a day (QID) | INTRAMUSCULAR | Status: DC
  Administered 2020-12-30 – 2020-12-31 (×2): 2 mg via INTRAVENOUS
  Filled 2020-12-30 (×2): qty 1

## 2020-12-30 NOTE — ED Notes (Signed)
Pt called this RN to bedside. Pt states that he is having withdrawals and would like some Ativan. Pt is notably shaken and sweaty. Pt saying he is nauseous after eating his dinner. Dr. Larinda Buttery, MD made aware. See new orders.

## 2020-12-30 NOTE — ED Notes (Signed)
Hourly rounding completed at this time, patient currently asleep in hallway bed. No complaints, stable, and in no acute distress. Q15 minute rounds and monitoring via Rover and Officer to continue. 

## 2020-12-30 NOTE — ED Provider Notes (Signed)
[facility] Emergency Department Provider Note   ____________________________________________   Event Date/Time   First MD Initiated Contact with Patient 12/30/20 1704     (approximate)  I have reviewed the triage vital signs and the nursing notes.   HISTORY  Chief Complaint Suicidal    HPI Gary [name] is a 35 y.o. male with past medical history of HIV, subdural hematoma, and alcohol abuse who presents to the ED for suicidal ideation.  Per EMS, patient was found at the [employer] where he had stated to bystanders that he wanted to kill himself by drink himself to death.  The first thing that the patient says to me is "I want to kill myself."  He again describes a plan to drink himself to death.  When asked how much he has had to drink, patient states "not enough."  He is unable to state exactly how much he had to drink today or when his last drink was.  He denies any drug abuse.  He denies any medical complaints at this time.        Past Medical History:  Diagnosis Date  . Alcohol abuse   . HIV (human immunodeficiency virus infection) (HCC)   . Subdural hematoma [facility])     Patient Active Problem List   Diagnosis Date Noted  . 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 Elevated troponin 10/19/2020  . Alcohol withdrawal (HCC) 10/19/2020  . Alcohol abuse   . Alcohol poisoning   . Suicidal ideation 06/14/2020  . Pancreatitis 06/13/2020  . 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  . Malnutrition of moderate degree 05/07/2020  . Gastrointestinal hemorrhage   . Alcoholic hepatitis without ascites   . Melena 05/05/2020  . Fever 05/05/2020  . Alcoholic ketoacidosis 05/05/2020  . Thrombocytopenia (HCC) 07/25/2019  . Transaminitis 07/25/2019  . Diarrhea 07/25/2019  . Traumatic subdural hematoma (HCC) 07/02/2019  . Alcohol abuse with intoxication (HCC) 07/02/2019  . Alcohol abuse with alcohol-induced mood disorder (HCC) 05/13/2019  .  Alcohol dependence (HCC) 04/16/2019  . 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 HIV disease (HCC) 06/16/2018  . Polysubstance abuse (HCC) 06/16/2018  . Cigarette smoker 06/16/2018    Past Surgical History:  Procedure Laterality Date  . INCISION AND DRAINAGE PERIRECTAL ABSCESS N/A 09/24/2016   Procedure: IRRIGATION AND DEBRIDEMENT PERIRECTAL ABSCESS;  Surgeon: Ricarda Frame, MD;  Location: [facility] ORS;  Service: General;  Laterality: N/A;  . none      Prior to Admission medications   Medication Sig Start Date End Date Taking? Authorizing Provider  elvitegravir-cobicistat-emtricitabine-tenofovir (GENVOYA) 150-150-200-10 MG TABS tablet Take 1 tablet by mouth daily with breakfast. Patient not taking: No sig reported 09/28/20   Clapacs, Jackquline Denmark, MD  famotidine (PEPCID) 20 MG tablet Take 1 tablet (20 mg total) by mouth 2 (two) times daily. Patient not taking: Reported on 06/01/2019 05/14/19 06/02/19  Benjiman Core, MD    Allergies Tegretol [carbamazepine] and Caffeine  Family History  Problem Relation Age of Onset  . Alcohol abuse Mother   . Alcohol abuse Father     Social History Social History   Tobacco Use  . Smoking status: Former Smoker    Packs/day: 0.50    Types: Cigarettes  . Smokeless tobacco: Never Used  . Tobacco comment: unable to smoke while incarcerated 6+ months 02/13/20  Vaping Use  . Vaping Use: Never used  Substance Use Topics  . Alcohol use: Yes    Comment: "I drink  all I can"  . Drug use: Not Currently    Types: Marijuana, Methamphetamines    Comment: last used 04/10/2019    Review of Systems  Constitutional: No fever/chills Eyes: No visual changes. ENT: No sore throat. Cardiovascular: Denies chest pain. Respiratory: Denies shortness of breath. Gastrointestinal: No abdominal pain.  No nausea, no vomiting.  No diarrhea.  No constipation. Genitourinary: Negative for dysuria. Musculoskeletal: Negative  for back pain. Skin: Negative for rash. Neurological: Negative for headaches, focal weakness or numbness.  Positive for suicidal ideation.  ____________________________________________   PHYSICAL EXAM:  VITAL SIGNS: ED Triage Vitals [12/30/20 1704]  Enc Vitals Group     BP (!) 120/94     Pulse Rate (!) 117     Resp 18     Temp      Temp src      SpO2 99 %     Weight 154 lb 5.2 oz (70 kg)     Height 6\' 1"  (1.854 m)     Head Circumference      Peak Flow      Pain Score 0     Pain Loc      Pain Edu?      Excl. in GC?     Constitutional: Awake and alert, intoxicated appearing. Eyes: Conjunctivae are normal. Head: Atraumatic. Nose: No congestion/rhinnorhea. Mouth/Throat: Mucous membranes are moist. Neck: Normal ROM Cardiovascular: Normal rate, regular rhythm. Grossly normal heart sounds. Respiratory: Normal respiratory effort.  No retractions. Lungs CTAB. Gastrointestinal: Soft and nontender. No distention. Genitourinary: deferred Musculoskeletal: No lower extremity tenderness nor edema. Neurologic:  Normal speech and language. No gross focal neurologic deficits are appreciated. Skin:  Skin is warm, dry and intact. No rash noted. Psychiatric: Mood and affect are normal. Speech and behavior are normal.  ____________________________________________   LABS (all labs ordered are listed, but only abnormal results are displayed)  Labs Reviewed  COMPREHENSIVE METABOLIC PANEL - Abnormal; Notable for the following components:      Result Value   Total Protein 8.2 (*)    AST 50 (*)    All other components within normal limits  ETHANOL - Abnormal; Notable for the following components:   Alcohol, Ethyl (B) 359 (*)    All other components within normal limits  CBC WITH DIFFERENTIAL/PLATELET  URINE DRUG SCREEN, QUALITATIVE ([facility] ONLY)     PROCEDURES  Procedure(s) performed (including Critical  Care):  Procedures   ____________________________________________   INITIAL IMPRESSION / ASSESSMENT AND PLAN / ED COURSE       35 year old male with past medical history of HIV, subdural hematoma, and alcohol abuse who presents to the ED for alcohol intoxication and statements at the 20 that he wanted to kill himself.  Patient continues to endorse suicidal ideation with plan to drink himself to death.  We will screen labs and have patient evaluated by psychiatry.  He has a long history of alcohol abuse and we will monitor for any symptoms of withdrawal, although he appears significantly intoxicated at this time.  We will hold off on IVC for now as he is calm and cooperative.  The patient has been placed in psychiatric observation due to the need to provide a safe environment for the patient while obtaining psychiatric consultation and evaluation, as well as ongoing medical and medication management to treat the patient's condition.  The patient has not been placed under full IVC at this time.       ____________________________________________   FINAL CLINICAL IMPRESSION(S) /  ED DIAGNOSES  Final diagnoses:  Alcohol abuse  Suicidal ideation     ED Discharge Orders    None       Note:  This document was prepared using Dragon voice recognition software and may include unintentional dictation errors.   Chesley Noon, MD 12/30/20 1950

## 2020-12-30 NOTE — ED Triage Notes (Signed)
BIB [facility] from [employer], where he told bystanders that he wanted to kill himself by drinking himself to death. Pt requesting beer upon arrival to the ED. Pt states that he wants to drink until he dies. Denies HI.

## 2020-12-30 NOTE — ED Notes (Signed)
Pt eating meal tray 

## 2020-12-30 NOTE — ED Notes (Addendum)
Green jacket, black hat, watch, green socks, polo, brown boots, jeans with fabric belt and black backpack placed in labeled belongings bag 3.

## 2020-12-30 NOTE — BH Assessment (Signed)
TTS attempted to assess pt but was unsuccessful due to pt's current impaired presentation, disorganized, incoherent and restless. Pt mumbled to TTS "it's alcohol and suicide" before laying down and closing his eyes. TTS will follow up accordingly.

## 2020-12-30 NOTE — Consult Note (Signed)
[facility]   Reason for Consult: Alcohol Problem Referring Physician: Dr. Scotty CourtStafford Patient Identification: Gary Amesony P Debruler MRN:  [id number] Principal Diagnosis: <principal problem not specified> Diagnosis:  Active Problems:   Alcohol dependence (HCC)   Alcohol abuse with alcohol-induced mood disorder (HCC)   Alcohol abuse with intoxication (HCC)   Alcoholic hepatitis without ascites   Alcohol abuse   Alcohol withdrawal (HCC)   Total Time spent with patient: 30 minutes  Subjective: "I am going to drink myself until I die."  Gary [name] is a 35 y.o. male patient [facility] Via EMS requesting alcohol detox and patient is voluntary. Per the ED triage nurse note,Pt presents via EMS requesting ETOH detox. EMS reports pt seen several time over the last few days with same complaints. EMS reports ETOH intake just prior to arrival. The patient alcohol level on 02.21.22 is 359 mg/dl.  The patient was seen face-to-face by this provider; the chart was reviewed and consulted with Dr. Larinda ButteryJessup on 12/30/2020 due to the patient's care. It was discussed with the EDP that the patient does meet the criteria to be admitted to the psychiatric inpatient unit due to him voicing suicidal ideation with a plan to drink himself to death. On evaluation, the patient is alert and oriented x4, calm, cooperative, and mood-congruent with affect. The patient does not appear to be responding to internal or external stimuli. The patient is presenting with some delusional thinking. The patient denies auditory or visual hallucinations. The patient admits to suicidal ideations and plans to drink himself (alcohol) to death. The patient is presenting with psychotic and paranoid behaviors. During an encounter with the patient, he could barely answer questions appropriately.  HPI: Per Dr. Scotty CourtStafford, Gary Amesony P Shough is a 35 y.o. male with a history of HIV, subdural hematoma, amphetamine abuse, and  alcohol abuse and dependence who comes to the ED complaining of abdominal pain, worried that he might suffer alcohol withdrawal.  He drinks about 6 bottles of wine daily.  Today he has had 3 bottles of wine, states that he started drinking when he woke up as per his usual routine.  However, as the day went on he started having upper abdominal pain and feeling like he could not continue drinking.  Denies any shakes or seizures, no hallucinations.  Not agitated.  Wants to eat a biscuit that he brought with him.  Denies SI HI or hallucinations at the present time.  To me he does not express interest in detox after I brought it up and tried to engage about past detox history and current motivation  Denies any other drug use.Marland Kitchen.  Past Psychiatric History:  Alcohol abuse  Risk to Self:  Yes Risk to Others:   No Prior Inpatient Therapy:  Yes Prior Outpatient Therapy:  Yes  Past Medical History:  Past Medical History:  Diagnosis Date  . Alcohol abuse   . HIV (human immunodeficiency virus infection) (HCC)   . Subdural hematoma [facility](HCC)     Past Surgical History:  Procedure Laterality Date  . INCISION AND DRAINAGE PERIRECTAL ABSCESS N/A 09/24/2016   Procedure: IRRIGATION AND DEBRIDEMENT PERIRECTAL ABSCESS;  Surgeon: Ricarda Frameharles Woodham, MD;  Location: [facility] ORS;  Service: General;  Laterality: N/A;  . none     Family History:  Family History  Problem Relation Age of Onset  . Alcohol abuse Mother   . Alcohol abuse Father    Family Psychiatric  History: Unknown Social History:  Social History  Substance and Sexual Activity  Alcohol Use Yes   Comment: "I drink all I can"     Social History   Substance and Sexual Activity  Drug Use Not Currently  . Types: Marijuana, Methamphetamines   Comment: last used 04/10/2019    Social History   Socioeconomic History  . Marital status: Single    Spouse name: Not on file  . Number of children: Not on file  . Years of education: Not on file  .  Highest education level: Not on file  Occupational History  . Occupation: unemployed  Tobacco Use  . Smoking status: Former Smoker    Packs/day: 0.50    Types: Cigarettes  . Smokeless tobacco: Never Used  . Tobacco comment: unable to smoke while incarcerated 6+ months 02/13/20  Vaping Use  . Vaping Use: Never used  Substance and Sexual Activity  . Alcohol use: Yes    Comment: "I drink all I can"  . Drug use: Not Currently    Types: Marijuana, Methamphetamines    Comment: last used 04/10/2019  . Sexual activity: Not Currently    Comment: unable to offer condoms while incarcerated  Other Topics Concern  . Not on file  Social History Narrative   "Currently living on the streets"   Independent at baseline   Social Determinants of Health   Financial Resource Strain: Not on file  Food Insecurity: Not on file  Transportation Needs: Not on file  Physical Activity: Not on file  Stress: Not on file  Social Connections: Not on file   Additional Social History:    Allergies:   Allergies  Allergen Reactions  . Tegretol [Carbamazepine] Other (See Comments)    Caused vertigo for 2 days after taking it  . Caffeine Palpitations    Labs:  Results for orders placed or performed during the hospital encounter of 12/30/20 (from the past 48 hour(s))  CBC with Differential     Status: None   Collection Time: 12/30/20  5:12 PM  Result Value Ref Range   WBC 5.2 4.0 - 10.5 K/uL   RBC 4.38 4.22 - 5.81 MIL/uL   Hemoglobin 14.2 13.0 - 17.0 g/dL   HCT 06.3 01.6 - 01.0 %   MCV 93.4 80.0 - 100.0 fL   MCH 32.4 26.0 - 34.0 pg   MCHC 34.7 30.0 - 36.0 g/dL   RDW 93.2 35.5 - 73.2 %   Platelets 198 150 - 400 K/uL   nRBC 0.0 0.0 - 0.2 %   Neutrophils Relative % 45 %   Neutro Abs 2.3 1.7 - 7.7 K/uL   Lymphocytes Relative 48 %   Lymphs Abs 2.5 0.7 - 4.0 K/uL   Monocytes Relative 4 %   Monocytes Absolute 0.2 0.1 - 1.0 K/uL   Eosinophils Relative 2 %   Eosinophils Absolute 0.1 0.0 - 0.5 K/uL    Basophils Relative 1 %   Basophils Absolute 0.0 0.0 - 0.1 K/uL   Immature Granulocytes 0 %   Abs Immature Granulocytes 0.00 0.00 - 0.07 K/uL    Comment: Performed at [facility], [address] [place], [place] [address]  Comprehensive metabolic panel     Status: Abnormal   Collection Time: 12/30/20  5:12 PM  Result Value Ref Range   Sodium 139 135 - 145 mmol/L   Potassium 3.7 3.5 - 5.1 mmol/L   Chloride 101 98 - 111 mmol/L   CO2 26 22 - 32 mmol/L   Glucose, Bld 78 70 - 99  mg/dL    Comment: Glucose reference range applies only to samples taken after fasting for at least 8 hours.   BUN 6 6 - 20 mg/dL   Creatinine, Ser 8.10 0.61 - 1.24 mg/dL   Calcium 9.0 8.9 - 17.5 mg/dL   Total Protein 8.2 (H) 6.5 - 8.1 g/dL   Albumin 4.6 3.5 - 5.0 g/dL   AST 50 (H) 15 - 41 U/L   ALT 33 0 - 44 U/L   Alkaline Phosphatase 55 38 - 126 U/L   Total Bilirubin 0.9 0.3 - 1.2 mg/dL   GFR, Estimated >10 >25 mL/min    Comment: (NOTE) Calculated using the CKD-EPI Creatinine Equation (2021)    Anion gap 12 5 - 15    Comment: Performed at [facility], [address] [place], [place] [address]  Ethanol     Status: Abnormal   Collection Time: 12/30/20  5:12 PM  Result Value Ref Range   Alcohol, Ethyl (B) 359 (HH) <10 mg/dL    Comment: CRITICAL RESULT CALLED TO, READ BACK BY AND VERIFIED WITH ALLY YOW @1806  12/30/20 MJU (NOTE) Lowest detectable limit for serum alcohol is 10 mg/dL.  For medical purposes only. Performed at [facility], [address] [place], [place]     Current Facility-Administered Medications  Medication Dose Route Frequency Provider Last Rate Last Admin  . LORazepam (ATIVAN) injection 0-4 mg  0-4 mg Intravenous Q6H 82423, MD   2 mg at 12/30/20 2000   Or  . LORazepam (ATIVAN) tablet 0-4 mg  0-4 mg Oral Q6H 01/01/21, MD      . Chesley Noon ON 01/02/2021] LORazepam (ATIVAN) injection 0-4 mg  0-4 mg Intravenous Q12H 01/04/2021,  MD       Or  . Chesley Noon ON 01/02/2021] LORazepam (ATIVAN) tablet 0-4 mg  0-4 mg Oral Q12H 01/04/2021, MD      . thiamine tablet 100 mg  100 mg Oral Daily Chesley Noon, MD       Or  . thiamine (B-1) injection 100 mg  100 mg Intravenous Daily Chesley Noon, MD       Current Outpatient Medications  Medication Sig Dispense Refill  . elvitegravir-cobicistat-emtricitabine-tenofovir (GENVOYA) 150-150-200-10 MG TABS tablet Take 1 tablet by mouth daily with breakfast. (Patient not taking: No sig reported) 30 tablet 0    Musculoskeletal: Strength & Muscle Tone: decreased Gait & Station: unsteady Patient leans: N/A  Psychiatric Specialty Exam: Physical Exam Vitals and nursing note reviewed.  Constitutional:      General: He is in acute distress.  HENT:     Right Ear: External ear normal.     Left Ear: External ear normal.     Nose: Nose normal.     Mouth/Throat:     Mouth: Mucous membranes are dry.  Cardiovascular:     Rate and Rhythm: Tachycardia present.  Pulmonary:     Effort: Pulmonary effort is normal.  Musculoskeletal:        General: Normal range of motion.     Cervical back: Normal range of motion and neck supple.  Neurological:     Mental Status: He is alert and oriented to person, place, and time.  Psychiatric:        Mood and Affect: Affect is blunt, flat and inappropriate.        Speech: Speech is delayed.        Behavior: Behavior is slowed.        Thought Content: Thought  content includes suicidal ideation.        Cognition and Memory: Memory normal.        Judgment: Judgment is inappropriate.     Review of Systems  Psychiatric/Behavioral: Positive for behavioral problems and suicidal ideas. The patient is nervous/anxious.   All other systems reviewed and are negative.   Blood pressure (!) 120/94, pulse (!) 117, temperature 98 F (36.7 C), temperature source Oral, resp. rate 18, height 6\' 1"  (1.854 m), weight 70 kg, SpO2 99 %.Body mass index is 20.36  kg/m.  General Appearance: Bizarre and Disheveled  Eye Contact:  Minimal  Speech:  Blocked, Garbled and Slow  Volume:  Decreased  Mood:  Depressed and Hopeless  Affect:  Blunt, Depressed, Flat and Inappropriate  Thought Process:  Coherent  Orientation:  Full (Time, Place, and Person)  Thought Content:  Logical, Obsessions and Paranoid Ideation  Suicidal Thoughts:  Yes.  with intent/plan  Homicidal Thoughts:  No  Memory:  Immediate;   Good Recent;   Good Remote;   Good  Judgement:  Poor  Insight:  Lacking  Psychomotor Activity:  Normal  Concentration:  Concentration: Fair and Attention Span: Fair  Recall:  of Knowledge:  Good  Language:  Poor  Akathisia:  Negative  Handed:  Right  AIMS (if indicated):     Assets:  Communication Skills Desire for Improvement Financial Resources/Insurance Physical Health Resilience Social Support  ADL's:  Intact  Cognition:  WNL  Sleep:        Treatment Plan Summary: Medication management and Plan The patient is a safety risk to himself and requires psychiatric inpatient admission for stabilization and treatment.  Disposition: Recommend psychiatric Inpatient admission when medically cleared. Supportive therapy provided about ongoing stressors.  [employer], NP 12/30/2020 10:32 PM

## 2020-12-30 NOTE — ED Notes (Signed)
VOL, pend psych consult 

## 2020-12-30 NOTE — ED Notes (Signed)
Belongings in [facility]. 

## 2020-12-30 NOTE — ED Provider Notes (Signed)
-----------------------------------------   6:03 AM on 12/30/2020 -----------------------------------------  Patient has been in no distress all night.  Clinically sober, has been resting but in no distress when awake.  Ambulatory without difficulty.  Will discharge when fully awake.   Loleta Rose, MD 12/30/20 [phone]

## 2020-12-30 NOTE — ED Notes (Signed)
Discharged to home, pt states he is taking the bus and knows how to get to the bus stop. Pt able to drink PO fluids without any difficulties. Pt able to ambulate safely independently without any difficulties. Left with all of belongings and discharge paperwork. Alert and oriented X 4, RR even and unlabored. Color WNL.

## 2020-12-30 NOTE — BH Assessment (Signed)
Comprehensive Clinical Assessment (CCA) Note  12/30/2020 Gary [name] [id number]  Chief Complaint: Patient is a 35 year old male presenting voluntarily for Alcohol abuse and depression. Per triage note BIB [facility] from [facility], where he told bystanders that he wanted to kill himself by drinking himself to death. Pt requesting beer upon arrival to the ED. Pt states that he wants to drink until he dies. Denies HI. Patient was just recently in this ED on requesting detox from alcohol and was discharged this morning after patient was sober. Patient has returned with similar presentation but is now endorsing SI with a plan. Patient reports "I tried to overdue it, I wanted to kill myself, life is shit, I just want to let go." Patient reports a plan to "drink myself to death." Patient reports that he started drinking at the age of 15 and reports that it has turned to heavy use "5 years ago." Patient reports that he is currently homeless, unemployed, has no family and has no psychiatrist or therapist. Patient reports SI with a plan, denies HI/AH/VH and does not appear to be responding to any internal or external stimuli  Per Psyc NP Elenore Paddy patient is recommended for Inpatient Hospitalization Chief Complaint  Patient presents with  . Suicidal   Visit Diagnosis: Alcohol Use Disorder Severe, Depression   CCA Screening, Triage and Referral (STR)  Patient Reported Information How did you hear about [place]? Self  Referral name: No data recorded Referral phone number: No data recorded  Whom do you see for routine medical problems? I don't have a doctor  Practice/Facility Name: No data recorded Practice/Facility Phone Number: No data recorded Name of Contact: No data recorded Contact Number: No data recorded Contact Fax Number: No data recorded Prescriber Name: No data recorded Prescriber Address (if known): No data recorded  What Is the Reason for Your Visit/Call Today? Depression and Alcohol  Use  How Long Has This Been Causing You Problems? > than 6 months  What Do You Feel Would Help You the Most Today? Assessment Only; Therapy; Medication   Have You Recently Been in Any Inpatient Treatment (Hospital/Detox/Crisis Center/28-Day Program)? No  Name/Location of Program/Hospital:No data recorded How Long Were You There? No data recorded When Were You Discharged? No data recorded  Have You Ever Received Services From [facility] Before? No  Who Do You See at [facility]? No data recorded  Have You Recently Had Any Thoughts About Hurting Yourself? Yes  Are You Planning to Commit Suicide/Harm Yourself At This time? Yes   Have you Recently Had Thoughts About Hurting Someone [name]? No  Explanation: No data recorded  Have You Used Any Alcohol or Drugs in the Past 24 Hours? Yes  How Long Ago Did You Use Drugs or Alcohol? 2214  What Did You Use and How Much? Alcohol   Do You Currently Have a Therapist/Psychiatrist? No  Name of Therapist/Psychiatrist: No data recorded  Have You Been Recently Discharged From Any Office Practice or Programs? No  Explanation of Discharge From Practice/Program: No data recorded    CCA Screening Triage Referral Assessment Type of Contact: Face-to-Face  Is this Initial or Reassessment? No data recorded Date Telepsych consult ordered in [facility]:  10/06/2020  Time Telepsych consult ordered in [facility]:  2217   Patient Reported Information Reviewed? Yes  Patient Left Without Being Seen? No data recorded Reason for Not Completing Assessment: Patient unable to participate in interview. Currently too impaired.   Collateral Involvement: None noted   Does  Patient Have a Automotive engineer Guardian? No data recorded Name and Contact of Legal Guardian: Self  If Minor and Not Living with Parent(s), Who has Custody? n/a  Is CPS involved or ever been involved? Never  Is APS involved or ever been involved? Never   Patient Determined To Be  At Risk for Harm To Self or Others Based on Review of Patient Reported Information or Presenting Complaint? Yes, for Self-Harm  Method: No data recorded Availability of Means: No data recorded Intent: No data recorded Notification Required: No data recorded Additional Information for Danger to Others Potential: No data recorded Additional Comments for Danger to Others Potential: No data recorded Are There Guns or Other Weapons in Your Home? No data recorded Types of Guns/Weapons: No data recorded Are These Weapons Safely Secured?                            No data recorded Who Could Verify You Are Able To Have These Secured: No data recorded Do You Have any Outstanding Charges, Pending Court Dates, Parole/Probation? No data recorded Contacted To Inform of Risk of Harm To Self or Others: Other: Comment (NA)   Location of Assessment: [facility]   Does Patient Present under Involuntary Commitment? No  IVC Papers Initial File Date: No data recorded  [place] of Residence: [place]   Patient Currently Receiving the Following Services: Not Receiving Services   Determination of Need: Emergent (2 hours)   Options For Referral: Outpatient Therapy     CCA Biopsychosocial Intake/Chief Complaint:  Patient is presenting voluntarily for SI and Alcohol Abuse  Current Symptoms/Problems: Patient is presenting voluntarily for SI and Alcohol Abuse, patient with a plan to drink himself to death   Patient Reported Schizophrenia/Schizoaffective Diagnosis in Past: No   Strengths: None reported  Preferences: None reported  Abilities: None reported   Type of Services Patient Feels are Needed: None reported   Initial Clinical Notes/Concerns: None   Mental Health Symptoms Depression:  Change in energy/activity; Fatigue; Hopelessness; Increase/decrease in appetite; Sleep (too much or little); Tearfulness; Worthlessness   Duration of Depressive symptoms: Greater than two weeks   Mania:   None   Anxiety:   Fatigue   Psychosis:  None   Duration of Psychotic symptoms: No data recorded  Trauma:  None   Obsessions:  None   Compulsions:  None   Inattention:  None   Hyperactivity/Impulsivity:  N/A   Oppositional/Defiant Behaviors:  None   Emotional Irregularity:  Chronic feelings of emptiness   Other Mood/Personality Symptoms:  No data recorded   Mental Status Exam Appearance and self-care  Stature:  Average   Weight:  Average weight   Clothing:  Disheveled   Grooming:  Neglected   Cosmetic use:  None   Posture/gait:  Slumped   Motor activity:  Slowed   Sensorium  Attention:  Confused   Concentration:  Normal   Orientation:  X5   Recall/memory:  Normal   Affect and Mood  Affect:  Depressed; Flat   Mood:  Depressed; Hopeless; Worthless   Relating  Eye contact:  Avoided   Facial expression:  Depressed; Sad   Attitude toward examiner:  Cooperative   Thought and Language  Speech flow: Slurred; Slow   Thought content:  Appropriate to Mood and Circumstances   Preoccupation:  Suicide   Hallucinations:  None   Organization:  No data recorded  [employer] of Knowledge:  Fair  Intelligence:  Average   Abstraction:  Normal   Judgement:  Poor   Reality Testing:  Adequate   Insight:  Lacking; Poor   Decision Making:  Impulsive   Social Functioning  Social Maturity:  Isolates   Social Judgement:  Impropriety   Stress  Stressors:  Housing; Office managerinancial   Coping Ability:  Contractorxhausted   Skill Deficits:  None   Supports:  Support needed     Religion: Religion/Spirituality Are You A Religious Person?: No  Leisure/Recreation: Leisure / Recreation Do You Have Hobbies?: No  Exercise/Diet: Exercise/Diet Do You Exercise?: No Have You Gained or Lost A Significant Amount of Weight in the Past Six Months?: No Do You Follow a Special Diet?: No Do You Have Any Trouble Sleeping?: Yes Explanation of Sleeping  Difficulties: Patient has difficulty sleeping   CCA Employment/Education Employment/Work Situation: Employment / Work Situation Employment situation: Unemployed Patient's job has been impacted by current illness:  (Unknown) What is the longest time patient has a held a job?: Unknown Where was the patient employed at that time?: Unknown Has patient ever been in the Eli Lilly and Companymilitary?: No  Education: Education Is Patient Currently Attending School?: No Did Garment/textile technologistYou Graduate From [employer] School?:  (Unknown) Did You Have An Individualized Education Program (IIEP): No Did You Have Any Difficulty At Progress EnergySchool?: No Patient's Education Has Been Impacted by Current Illness: No   CCA Family/Childhood History Family and Relationship History: Family history Marital status: Single Are you sexually active?:  (Unknown) What is your sexual orientation?: Unknown Has your sexual activity been affected by drugs, alcohol, medication, or emotional stress?: Unknown Does patient have children?:  (Unknown)  Childhood History:  Childhood History By whom was/is the patient raised?:  (Unknown) Additional childhood history information: None reported Description of patient's relationship with caregiver when they were a child: None reported Patient's description of current relationship with people who raised him/her: None reported How were you disciplined when you got in trouble as a child/adolescent?: None reported Does patient have siblings?:  (Unknown) Did patient suffer any verbal/emotional/physical/sexual abuse as a child?:  (Unknown) Did patient suffer from severe childhood neglect?:  (Unknown) Has patient ever been sexually abused/assaulted/raped as an adolescent or adult?:  (Unknown) Was the patient ever a victim of a crime or a disaster?:  (Unknown) Witnessed domestic violence?:  (Unknown) Has patient been affected by domestic violence as an adult?:  (Unknown)  Child/Adolescent Assessment:     CCA Substance  Use Alcohol/Drug Use: Alcohol / Drug Use Pain Medications: See MAR Prescriptions: See MAR Over the Counter: See MAR History of alcohol / drug use?: Yes Withdrawal Symptoms: Blackouts,Change in blood pressure,Irritability,Nausea / Vomiting,Patient aware of relationship between substance abuse and physical/medical complications,Seizures,Sweats Substance #1 Name of Substance 1: Alcohol                       ASAM's:  Six Dimensions of Multidimensional Assessment  Dimension 1:  Acute Intoxication and/or Withdrawal Potential:      Dimension 2:  Biomedical Conditions and Complications:      Dimension 3:  Emotional, Behavioral, or Cognitive Conditions and Complications:     Dimension 4:  Readiness to Change:     Dimension 5:  Relapse, Continued use, or Continued Problem Potential:     Dimension 6:  Recovery/Living Environment:     ASAM Severity Score:    ASAM Recommended Level of Treatment:     Substance use Disorder (SUD) Substance Use Disorder (SUD)  Checklist Symptoms of Substance  Use: Continued use despite having a persistent/recurrent physical/psychological problem caused/exacerbated by use,Continued use despite persistent or recurrent social, interpersonal problems, caused or exacerbated by use,Evidence of tolerance,Evidence of withdrawal (Comment),Large amounts of time spent to obtain, use or recover from the substance(s),Persistent desire or unsuccessful efforts to cut down or control use,Presence of craving or strong urge to use,Recurrent use that results in a failure to fulfill major role obligations (work, school, home),Repeated use in physically hazardous situations,Social, occupational, recreational activities given up or reduced due to use,Substance(s) often taken in larger amounts or over longer times than was intended  Recommendations for Services/Supports/Treatments:   Per Psyc NP Elenore Paddy patient is recommended for Inpatient Hospitalization  DSM5  Diagnoses: Patient Active Problem List   Diagnosis Date Noted  . 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 Elevated troponin 10/19/2020  . Alcohol withdrawal (HCC) 10/19/2020  . Alcohol abuse   . Alcohol poisoning   . Suicidal ideation 06/14/2020  . Pancreatitis 06/13/2020  . 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  . Malnutrition of moderate degree 05/07/2020  . Gastrointestinal hemorrhage   . Alcoholic hepatitis without ascites   . Melena 05/05/2020  . Fever 05/05/2020  . Alcoholic ketoacidosis 05/05/2020  . Thrombocytopenia (HCC) 07/25/2019  . Transaminitis 07/25/2019  . Diarrhea 07/25/2019  . Traumatic subdural hematoma (HCC) 07/02/2019  . Alcohol abuse with intoxication (HCC) 07/02/2019  . Alcohol abuse with alcohol-induced mood disorder (HCC) 05/13/2019  . Alcohol dependence (HCC) 04/16/2019  . 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 HIV disease (HCC) 06/16/2018  . Polysubstance abuse (HCC) 06/16/2018  . Cigarette smoker 06/16/2018    Patient Centered Plan: Patient is on the following Treatment Plan(s):  Depression and Substance Abuse   Referrals to Alternative Service(s): Referred to Alternative Service(s):   Place:   Date:   Time:    Referred to Alternative Service(s):   Place:   Date:   Time:    Referred to Alternative Service(s):   Place:   Date:   Time:    Referred to Alternative Service(s):   Place:   Date:   Time:     Gary [name], LCAS-A

## 2020-12-30 NOTE — ED Notes (Signed)
TTS and Psych provider at bedside at this time.  

## 2020-12-31 DIAGNOSIS — F1014 Alcohol abuse with alcohol-induced mood disorder: Secondary | ICD-10-CM

## 2020-12-31 MED ORDER — CHLORDIAZEPOXIDE HCL 25 MG PO CAPS
25.0000 mg | ORAL_CAPSULE | Freq: Once | ORAL | Status: DC
  Filled 2020-12-31: qty 1

## 2020-12-31 NOTE — ED Provider Notes (Addendum)
7:27 AM Assumed care for off going team.   Blood pressure 120/85, pulse (!) 108, temperature 98.2 F (36.8 C), temperature source Oral, resp. rate (!) 22, height 6\' 1"  (1.854 m), weight 70 kg, SpO2 98 %.  See their HPI for full report but in brief patient came in voluntary for alcohol abuse and SI.  Patient was seen by psychiatry team overnight who recommended inpatient admission.  However this morning patient is now clinically sober.  His initial alcohol level overnight was in the 300s and this morning he is stating that he is not having any SI or HI and that he would like to be discharged home.  Patient is upset that he did not get any Ativan overnight to help with his withdrawal.  Discussed with patient I would be happy to prescribe some Ativan either IV or p.o. depending on his vital signs.  Patient stated that the p.o. does not help him.  I stated that we would need to check his heart rate and blood pressure to see what he would require.  Patient stated that he is not interested in having his vital signs rechecked.  Patient stated that he was going to go back home and try to wean himself off the alcohol.  Patient's had multiple ED visits for his alcohol abuse and at this time he is clinically sober and denying SI therefore no indication for IVC.  Per patient's request will discharge home.  Patient did have an episode of vomiting.  Patient was offered IV Ativan but patient stood up and his tremor stopped and he stated that he would like to go home at this time.  Again patient has capacity makes decision denies any SI therefore patient will be discharged home at this time         , MD 12/31/20 01/02/21    0865, MD 12/31/20 [phone]

## 2020-12-31 NOTE — ED Notes (Signed)
Pt requesting his belongings to leave. Pt not under IVC. MD notified.

## 2020-12-31 NOTE — ED Notes (Signed)
Dr Fuller Plan at pts bedside speaking with him.

## 2020-12-31 NOTE — Discharge Instructions (Signed)
We recommend you stay for further detox.  Please return to the ER if you decide that you would like to get help

## 2020-12-31 NOTE — ED Notes (Signed)
Pt refused VS and IV ativan. Pt reports he still wants to leave. Pt stood up from bed and it was noted that pts tremors subsided.

## 2020-12-31 NOTE — BH Assessment (Addendum)
No beds available with [facility] at this time  Referral information for Psychiatric Hospitalization faxed to;   [facility] [place] [phone] or [phone])  . [place] [phone]), Onalee Hua reports patient has not yet been reviewed  . [facility] [phone]) Denied due to no insurance

## 2020-12-31 NOTE — ED Notes (Addendum)
Pt states that we are not doing anything for him and that he would like to go home. Dr. Katrinka Blazing at bedside and states he will give him something for the anxiety. When this RN came to the bedside to administer the Librium. Pt states, "Librium is just like candy it is not going to work." Explained that his next dose of Ativan is not due and to take the Librium to help him till 0800 comes. Pt states, "[facility] usually gives me 3-4mg  of Ativan at a time. I am not taking the Librium. I just want to go get a drink. Librium won't get ride of the nausea and seizures." Offered pt nausea medication. Pt refused. Dr. Katrinka Blazing, MD made aware.

## 2020-12-31 NOTE — ED Provider Notes (Signed)
Emergency Medicine Observation Re-evaluation Note  Gary [name] is a 35 y.o. male, seen on rounds today.  Pt initially presented to the ED for complaints of Suicidal Currently, the patient is resting comfortably..  Physical Exam  BP 120/85 (BP Location: Right Arm)   Pulse (!) 108   Temp 98.2 F (36.8 C) (Oral)   Resp (!) 22   Ht 6\' 1"  (1.854 m)   Wt 70 kg   SpO2 98%   BMI 20.36 kg/m  Physical Exam Constitutional:      Appearance: He is not ill-appearing or toxic-appearing.  HENT:     Head: Atraumatic.  Cardiovascular:     Comments: Well perfused Pulmonary:     Effort: Pulmonary effort is normal.  Abdominal:     General: There is no distension.  Musculoskeletal:        General: No deformity.  Skin:    Findings: No rash.  Neurological:     General: No focal deficit present.     Cranial Nerves: No cranial nerve deficit.      ED Course / MDM  EKG:    I have reviewed the labs performed to date as well as medications administered while in observation.  Recent changes in the last 24 hours include psychiatric evaluation recommending inpatient stabilization and treatment..  Plan  Current plan is for inpatient psychiatric care. Patient is not under full IVC at this time.   , MD 12/31/20 [phone]

## 2020-12-31 NOTE — ED Notes (Signed)
Pt sitting up on the side of the bed stating he needs something for his anxiety pt is unable to have Ativan at this time, which was explained to the pt. Pt verbalized understanding and laid back down at this time.

## 2021-01-08 ENCOUNTER — Encounter (HOSPITAL_COMMUNITY): Payer: Self-pay | Admitting: Emergency Medicine

## 2021-01-08 ENCOUNTER — Emergency Department (HOSPITAL_COMMUNITY)
Admission: EM | Admit: 2021-01-08 | Discharge: 2021-01-09 | Disposition: A | Discharge status: Home / Self Care | Payer: Self-pay | Attending: Emergency Medicine | Admitting: Emergency Medicine

## 2021-01-08 ENCOUNTER — Other Ambulatory Visit: Payer: Self-pay

## 2021-01-08 ENCOUNTER — Emergency Department (HOSPITAL_COMMUNITY): Payer: Self-pay

## 2021-01-08 DIAGNOSIS — Z87891 Personal history of nicotine dependence: Secondary | ICD-10-CM | POA: Insufficient documentation

## 2021-01-08 DIAGNOSIS — R4182 Altered mental status, unspecified: Secondary | ICD-10-CM | POA: Insufficient documentation

## 2021-01-08 DIAGNOSIS — F10988 Alcohol use, unspecified with other alcohol-induced disorder: Secondary | ICD-10-CM

## 2021-01-08 DIAGNOSIS — Y908 Blood alcohol level of 240 mg/100 ml or more: Secondary | ICD-10-CM | POA: Insufficient documentation

## 2021-01-08 DIAGNOSIS — Y9229 Other specified public building as the place of occurrence of the external cause: Secondary | ICD-10-CM | POA: Insufficient documentation

## 2021-01-08 DIAGNOSIS — F1020 Alcohol dependence, uncomplicated: Secondary | ICD-10-CM

## 2021-01-08 DIAGNOSIS — X58XXXA Exposure to other specified factors, initial encounter: Secondary | ICD-10-CM | POA: Insufficient documentation

## 2021-01-08 DIAGNOSIS — F10920 Alcohol use, unspecified with intoxication, uncomplicated: Secondary | ICD-10-CM

## 2021-01-08 DIAGNOSIS — F10220 Alcohol dependence with intoxication, uncomplicated: Secondary | ICD-10-CM | POA: Insufficient documentation

## 2021-01-08 DIAGNOSIS — Z21 Asymptomatic human immunodeficiency virus [HIV] infection status: Secondary | ICD-10-CM | POA: Insufficient documentation

## 2021-01-08 DIAGNOSIS — R000 Tachycardia, unspecified: Secondary | ICD-10-CM | POA: Insufficient documentation

## 2021-01-08 DIAGNOSIS — S0101XA Laceration without foreign body of scalp, initial encounter: Secondary | ICD-10-CM | POA: Insufficient documentation

## 2021-01-08 DIAGNOSIS — D6959 Other secondary thrombocytopenia: Secondary | ICD-10-CM

## 2021-01-08 DIAGNOSIS — R451 Restlessness and agitation: Secondary | ICD-10-CM | POA: Insufficient documentation

## 2021-01-08 LAB — COMPREHENSIVE METABOLIC PANEL
ALT: 60 U/L — ABNORMAL HIGH (ref 0–44)
AST: 65 U/L — ABNORMAL HIGH (ref 15–41)
Albumin: 4.2 g/dL (ref 3.5–5.0)
Alkaline Phosphatase: 46 U/L (ref 38–126)
Anion gap: 10 (ref 5–15)
BUN: 6 mg/dL (ref 6–20)
CO2: 25 mmol/L (ref 22–32)
Calcium: 8.8 mg/dL — ABNORMAL LOW (ref 8.9–10.3)
Chloride: 108 mmol/L (ref 98–111)
Creatinine, Ser: 0.55 mg/dL — ABNORMAL LOW (ref 0.61–1.24)
GFR, Estimated: >60 mL/min (ref >60)
Glucose, Bld: 94 mg/dL (ref 70–99)
Potassium: 4 mmol/L (ref 3.5–5.1)
Sodium: 143 mmol/L (ref 135–145)
Total Bilirubin: 0.5 mg/dL (ref 0.3–1.2)
Total Protein: 7.3 g/dL (ref 6.5–8.1)

## 2021-01-08 LAB — CBC WITH DIFFERENTIAL/PLATELET
Abs Immature Granulocytes: 0 10*3/uL (ref 0.00–0.07)
Basophils Absolute: 0 10*3/uL (ref 0.0–0.1)
Basophils Relative: 1 %
Eosinophils Absolute: 0.1 10*3/uL (ref 0.0–0.5)
Eosinophils Relative: 4 %
HCT: 42.5 % (ref 39.0–52.0)
Hemoglobin: 14.3 g/dL (ref 13.0–17.0)
Immature Granulocytes: 0 %
Lymphocytes Relative: 54 %
Lymphs Abs: 1.7 10*3/uL (ref 0.7–4.0)
MCH: 32.8 pg (ref 26.0–34.0)
MCHC: 33.6 g/dL (ref 30.0–36.0)
MCV: 97.5 fL (ref 80.0–100.0)
Monocytes Absolute: 0.3 10*3/uL (ref 0.1–1.0)
Monocytes Relative: 9 %
Neutro Abs: 1 10*3/uL — ABNORMAL LOW (ref 1.7–7.7)
Neutrophils Relative %: 32 %
Platelets: 107 10*3/uL — ABNORMAL LOW (ref 150–400)
RBC: 4.36 MIL/uL (ref 4.22–5.81)
RDW: 14.2 % (ref 11.5–15.5)
WBC: 3.2 10*3/uL — ABNORMAL LOW (ref 4.0–10.5)
nRBC: 0 % (ref 0.0–0.2)

## 2021-01-08 LAB — ETHANOL: Alcohol, Ethyl (B): 390 mg/dL (ref <10)

## 2021-01-08 IMAGING — CT CT HEAD W/O CM
3 series · 14 of 47 positions shown, 16 images · non-contrast
Comparison: CT [DATE]

CLINICAL DATA: Head trauma altered

EXAM:
CT HEAD WITHOUT CONTRAST
TECHNIQUE: Contiguous axial images were obtained from the base of the skull
through the vertex without intravenous contrast.

[Series 2: head wo · axial · 0.47mm/px · z∈[-173,-13]mm · 8 of 38 slices shown, 10 images]
[im 3/38  brain]
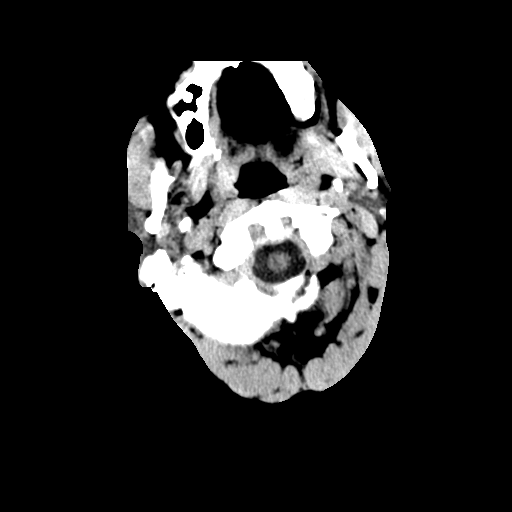
[im 3/38  bone]
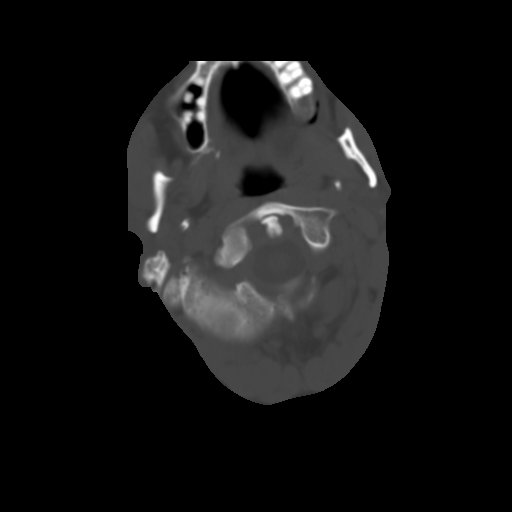
[im 8/38  brain]
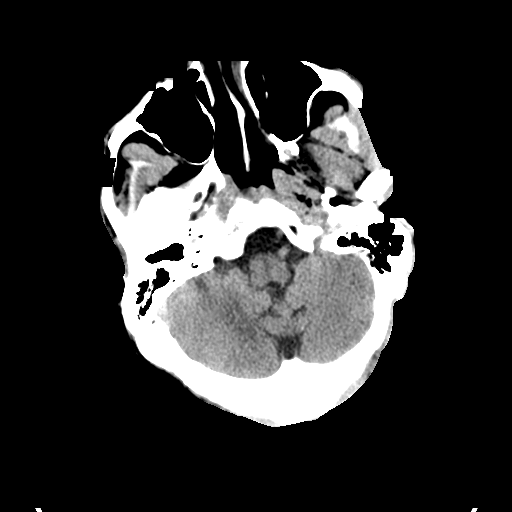
[im 12/38  brain]
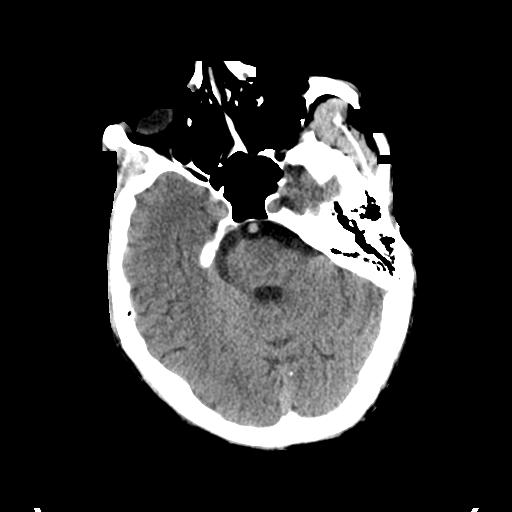
[im 17/38  brain]
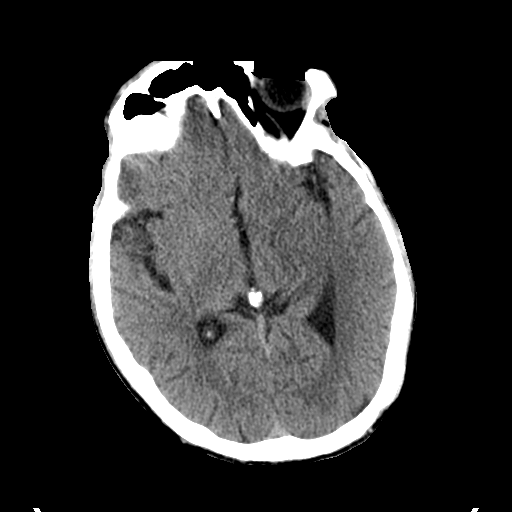
[im 21/38  brain]
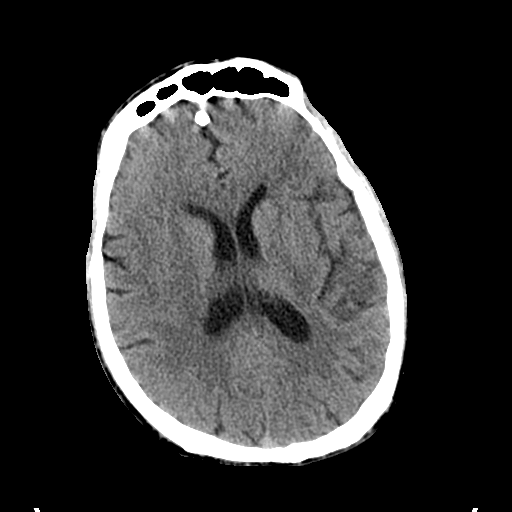
[im 21/38  bone]
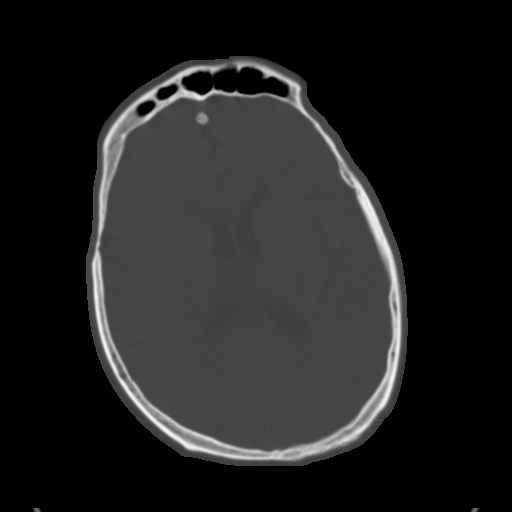
[im 26/38  brain]
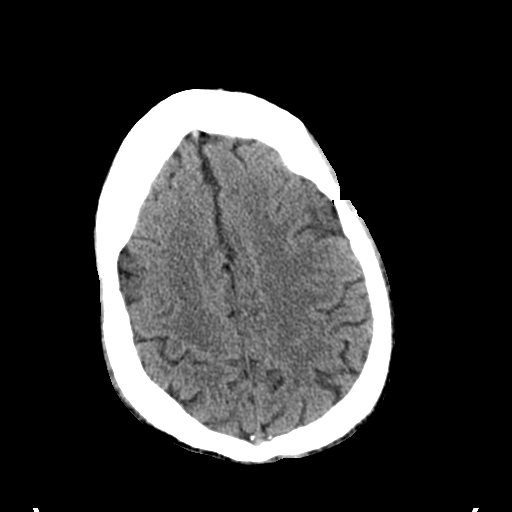
[im 30/38  brain]
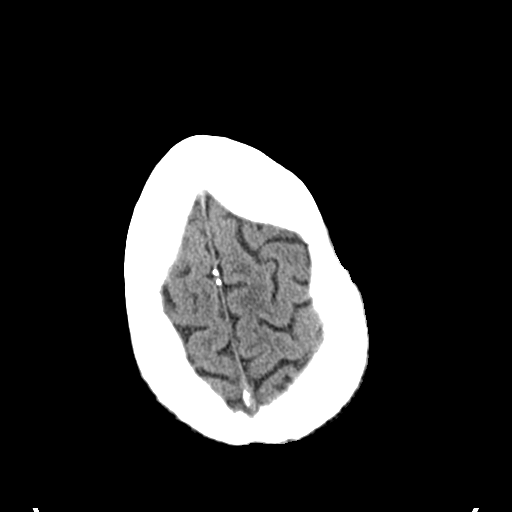
[im 35/38  brain]
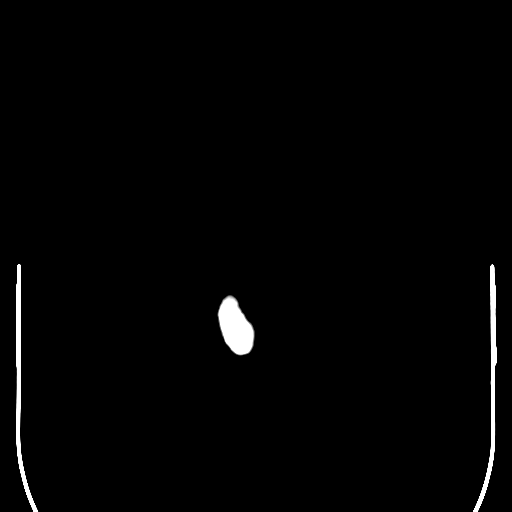

[Series 5: coronal soft tissue · coronal · 0.34mm/px · 3 of 73 slices shown]
[im 25/73  brain]
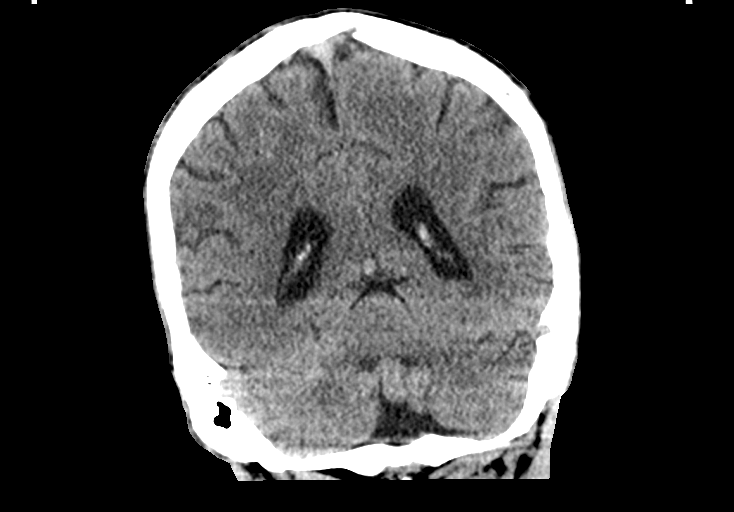
[im 33/73  brain]
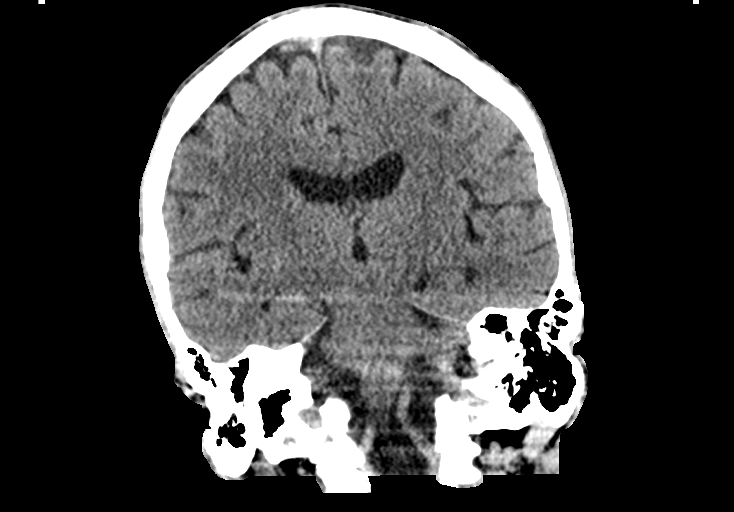
[im 41/73  brain]
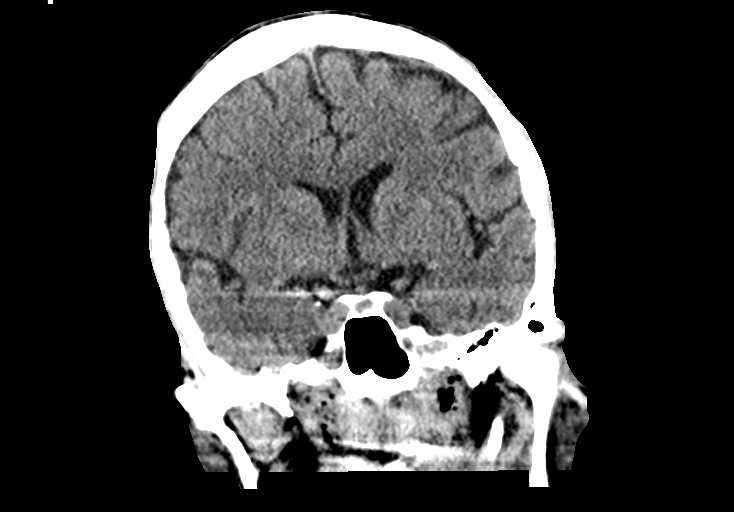

[Series 6: sagittal soft tissue · sagittal · 0.34mm/px · 3 of 58 slices shown]
[im 20/58  brain]
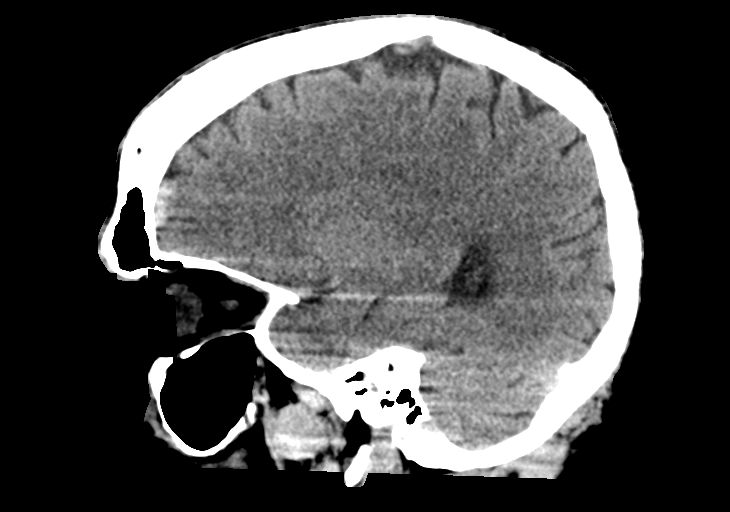
[im 29/58  brain]
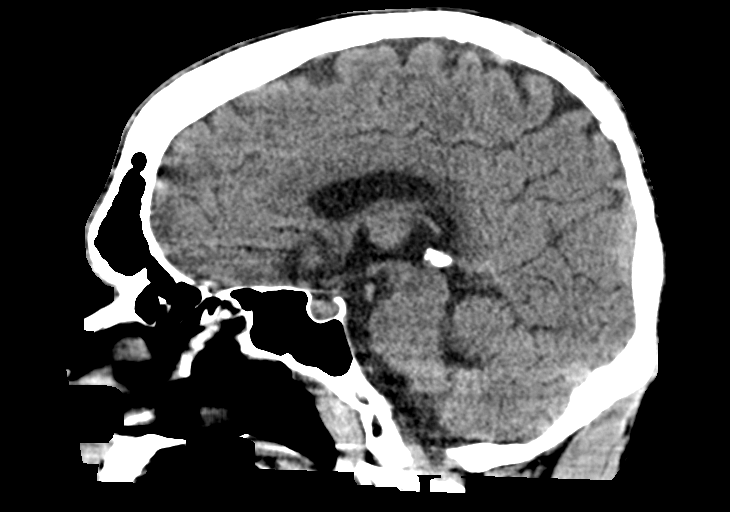
[im 39/58  brain]
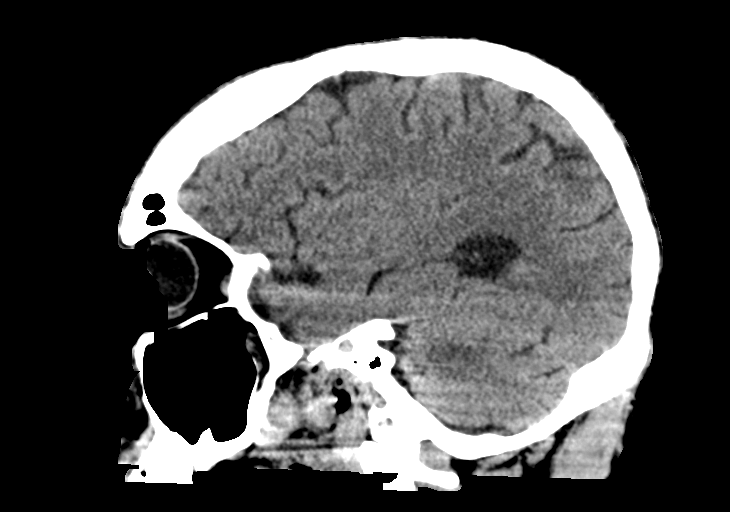

[14 of 47 positions shown; findings below may reference images not displayed]

FINDINGS: Brain: No acute territorial infarction, hemorrhage or intracranial
mass. The ventricles are nonenlarged.

Vascular: No hyperdense vessels.  No unexpected calcification.

Skull: No skull fracture

Sinuses/Orbits: No acute finding.

Other: None
IMPRESSION: No CT evidence for acute intracranial abnormality.

## 2021-01-08 MED ORDER — ZIPRASIDONE MESYLATE 20 MG IM SOLR
20.0000 mg | Freq: Once | INTRAMUSCULAR | Status: AC
  Administered 2021-01-08: 20 mg via INTRAMUSCULAR
  Filled 2021-01-08: qty 20

## 2021-01-08 MED ORDER — STERILE WATER FOR INJECTION IJ SOLN
INTRAMUSCULAR | Status: AC
  Administered 2021-01-08: 10 mL
  Filled 2021-01-08: qty 10

## 2021-01-08 MED ORDER — NICOTINE 21 MG/24HR TD PT24: 21.0000 mg | MEDICATED_PATCH | Freq: Once | TRANSDERMAL | Status: DC

## 2021-01-08 NOTE — Discharge Instructions (Addendum)
It was our pleasure to provide your ER care today - we hope that you feel better.  Avoid alcohol use. Make sure to never drive when drinking alcohol. Follow up with AA, and use resource guide provided as relates additional alcohol treatment programs.   Return to ER if worse, new symptoms, fevers, new or severe pain, chest pain, trouble breathing, or other concern.

## 2021-01-08 NOTE — ED Triage Notes (Signed)
Patient BIB [facility] with ETOH abuse.  Patient found [place] front of the police station passed out on the ground.  Patient was combative with EMS.  Arrived in restraints. Patient has a laceration to top of his head.    130-palp 100-HR 98% room air 148-CBG

## 2021-01-08 NOTE — ED Notes (Signed)
Patient wanting to leave, cussing in the hall and yelling.  [place], EDP at bedside.

## 2021-01-08 NOTE — ED Provider Notes (Signed)
[facility]-EMERGENCY DEPT Provider Note   CSN: [id number] Arrival date & time: 01/08/21  1843     History Chief Complaint  Patient presents with  . Alcohol Intoxication    OSSIEL MARCHIO is a 35 y.o. male presenting for evaluation of agitation.  Level V caveat due to AMS/agitation. Pt is clearly intoxicated.   Per triage note, patient was brought to the hospital after he was found [place] front of the police station passed out.  He was noted to have a laceration to the top of his head.  He was agitated and combative with EMS.  Patient states he is here for assistance with alcohol withdrawal. Last drink was today. Unable to state if he has had sz or DTs before.   Additional history obtained from chart review.  History of polysubstance abuse, alcohol abuse, HIV.   HPI     Past Medical History:  Diagnosis Date  . Alcohol abuse   . HIV (human immunodeficiency virus infection) (HCC)   . Subdural hematoma [facility])     Patient Active Problem List   Diagnosis Date Noted  . 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 Elevated troponin 10/19/2020  . Alcohol withdrawal (HCC) 10/19/2020  . Alcohol abuse   . Suicidal ideation 06/14/2020  . Pancreatitis 06/13/2020  . 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  . Malnutrition of moderate degree 05/07/2020  . Gastrointestinal hemorrhage   . Alcoholic hepatitis without ascites   . Melena 05/05/2020  . Alcoholic ketoacidosis 05/05/2020  . Thrombocytopenia (HCC) 07/25/2019  . Transaminitis 07/25/2019  . Traumatic subdural hematoma (HCC) 07/02/2019  . Alcohol abuse with intoxication (HCC) 07/02/2019  . Alcohol abuse with alcohol-induced mood disorder (HCC) 05/13/2019  . Alcohol dependence (HCC) 04/16/2019  . 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 HIV disease (HCC) 06/16/2018  . Polysubstance abuse (HCC) 06/16/2018  . Cigarette smoker 06/16/2018    Past Surgical  History:  Procedure Laterality Date  . INCISION AND DRAINAGE PERIRECTAL ABSCESS N/A 09/24/2016   Procedure: IRRIGATION AND DEBRIDEMENT PERIRECTAL ABSCESS;  Surgeon: Ricarda Frame, MD;  Location: [facility] ORS;  Service: General;  Laterality: N/A;  . none         Family History  Problem Relation Age of Onset  . Alcohol abuse Mother   . Alcohol abuse Father     Social History   Tobacco Use  . Smoking status: Former Smoker    Packs/day: 0.50    Types: Cigarettes  . Smokeless tobacco: Never Used  . Tobacco comment: unable to smoke while incarcerated 6+ months 02/13/20  Vaping Use  . Vaping Use: Never used  Substance Use Topics  . Alcohol use: Yes    Comment: "I drink all I can"  . Drug use: Not Currently    Types: Marijuana, Methamphetamines    Comment: last used 04/10/2019    Home Medications Prior to Admission medications   Medication Sig Start Date End Date Taking? Authorizing Provider  elvitegravir-cobicistat-emtricitabine-tenofovir (GENVOYA) 150-150-200-10 MG TABS tablet Take 1 tablet by mouth daily with breakfast. Patient not taking: No sig reported 09/28/20   Clapacs, Jackquline Denmark, MD  famotidine (PEPCID) 20 MG tablet Take 1 tablet (20 mg total) by mouth 2 (two) times daily. Patient not taking: Reported on 06/01/2019 05/14/19 06/02/19  Benjiman Core, MD    Allergies    Tegretol [carbamazepine] and Caffeine  Review of Systems   Review of Systems  Unable to perform ROS: Mental status change  Physical Exam Updated Vital Signs BP 108/68 (BP Location: Left Arm)   Pulse 94   Temp 98.1 F (36.7 C)   Resp 18   SpO2 94%   Physical Exam Vitals and nursing note reviewed.  Constitutional:      General: He is not in acute distress.    Appearance: He is well-developed and well-nourished.     Comments: Appears agitated and intoxicated  HENT:     Head: Normocephalic.     Comments: Small lac to the crown without active bleeding.  Eyes:     Extraocular Movements:  Extraocular movements intact and EOM normal.  Cardiovascular:     Rate and Rhythm: Tachycardia present.     Pulses: Intact distal pulses.  Pulmonary:     Effort: Pulmonary effort is normal.  Abdominal:     General: There is no distension.  Musculoskeletal:        General: Normal range of motion.     Cervical back: Normal range of motion and neck supple.     Comments: Ambulatory, but swaying slightly. Moving all extremities  Skin:    General: Skin is warm and dry.     Capillary Refill: Capillary refill takes less than 2 seconds.  Psychiatric:        Mood and Affect: Affect is angry.        Speech: Speech is slurred.        Behavior: Behavior is agitated and aggressive.     ED Results / Procedures / Treatments   Labs (all labs ordered are listed, but only abnormal results are displayed) Labs Reviewed  CBC WITH DIFFERENTIAL/PLATELET - Abnormal; Notable for the following components:      Result Value   WBC 3.2 (*)    Platelets 107 (*)    Neutro Abs 1.0 (*)    All other components within normal limits  COMPREHENSIVE METABOLIC PANEL - Abnormal; Notable for the following components:   Creatinine, Ser 0.55 (*)    Calcium 8.8 (*)    AST 65 (*)    ALT 60 (*)    All other components within normal limits  ETHANOL - Abnormal; Notable for the following components:   Alcohol, Ethyl (B) 390 (*)    All other components within normal limits  RAPID URINE DRUG SCREEN, HOSP PERFORMED    EKG None  Radiology CT Head Wo Contrast  Result Date: 01/08/2021 CLINICAL DATA:  Head trauma altered EXAM: CT HEAD WITHOUT CONTRAST TECHNIQUE: Contiguous axial images were obtained from the base of the skull through the vertex without intravenous contrast. COMPARISON:  CT 09/27/2020 FINDINGS: Brain: No acute territorial infarction, hemorrhage or intracranial mass. The ventricles are nonenlarged. Vascular: No hyperdense vessels.  No unexpected calcification. Skull: No skull fracture Sinuses/Orbits: No acute  finding. Other: None IMPRESSION: No CT evidence for acute intracranial abnormality. Electronically Signed   By: Jasmine Pang M.D.   On: 01/08/2021 20:28    Procedures .Critical Care Performed by: Alveria Apley, PA-C Authorized by: Alveria Apley, PA-C   Critical care provider statement:    Critical care time (minutes):  35   Critical care time was exclusive of:  Separately billable procedures and treating other patients and teaching time   Critical care was necessary to treat or prevent imminent or life-threatening deterioration of the following conditions:  CNS failure or compromise   Critical care was time spent personally by me on the following activities:  Blood draw for specimens, development of treatment plan with patient or surrogate,  evaluation of patient's response to treatment, examination of patient, obtaining history from patient or surrogate, ordering and performing treatments and interventions, ordering and review of laboratory studies, ordering and review of radiographic studies, pulse oximetry, re-evaluation of patient's condition and review of old charts   I assumed direction of critical care for this patient from another provider in my specialty: no   Comments:     Pt given geodon for pt and staff safety.      Medications Ordered in ED Medications  nicotine (NICODERM CQ - dosed in mg/24 hours) patch 21 mg (0 mg Transdermal Hold 01/08/21 2150)  ziprasidone (GEODON) injection 20 mg (20 mg Intramuscular Given 01/08/21 1936)  sterile water (preservative free) injection (10 mLs  Given 01/08/21 1936)    ED Course  I have reviewed the triage vital signs and the nursing notes.  Pertinent labs & imaging results that were available during my care of the patient were reviewed by me and considered in my medical decision making (see chart for details).    MDM Rules/Calculators/A&P                          Patient presenting after being found passed out clinically station.   On exam, is ambulatory, but swearing slightly.  He appears intoxicated.  He does have signs of trauma to the head, though it is a small laceration that does not require repair.  Likely alcohol intoxication, however also consider head injury.  Due to being argumentative and combative, patient will require chemical sedation with Geodon for patient and staff safety so that he can complete the rest of his work-up.  Labs interpreted by me, shows mild elevation of LFTs, consistent with alcohol use.  Ethanol level is elevated, likely cause of patient's mental status.  CT head negative for acute findings.  Patient is resting comfortably after the Geodon.  Will plan to have him continue to metabolize, plan for discharge when he is more awake and alert.  Final Clinical Impression(s) / ED Diagnoses Final diagnoses:  Alcoholic intoxication without complication (HCC)  Laceration of scalp without foreign body, initial encounter    Rx / DC Orders ED Discharge Orders    None       Drema Balzarine 01/08/21 2214    Cheryll Cockayne, MD 01/08/21 2223

## 2021-01-09 ENCOUNTER — Other Ambulatory Visit: Payer: Self-pay

## 2021-01-09 NOTE — ED Notes (Signed)
Pt alert, vss, drinking ginger ale at bedside.

## 2021-01-09 NOTE — ED Notes (Signed)
Pt resting comfortably, no distress noted, RR even/unlabored.

## 2021-01-09 NOTE — ED Notes (Signed)
Pt resting comfortably, no distress noted, RR even/unlabored.  

## 2021-01-09 NOTE — ED Provider Notes (Signed)
Patient had been signed out to d/c home this AM.   Pt is tolerating po. No vomiting. No tremor or shakes. Ambulate in hall.   Peer support referral made. Additional etoh treatment options also provided.   Patient currently appears stable for d/c.   Return precautions provided.      Cathren Laine, MD 01/09/21 [phone]

## 2021-01-09 NOTE — ED Notes (Addendum)
Pt resting comfortably, no distress noted, RR even/unlabored.  

## 2021-01-11 ENCOUNTER — Emergency Department (HOSPITAL_COMMUNITY)
Admission: EM | Admit: 2021-01-11 | Discharge: 2021-01-11 | Disposition: A | Discharge status: Home / Self Care | Payer: Self-pay | Attending: Emergency Medicine | Admitting: Emergency Medicine

## 2021-01-11 ENCOUNTER — Encounter (HOSPITAL_COMMUNITY): Payer: Self-pay

## 2021-01-11 ENCOUNTER — Emergency Department (HOSPITAL_COMMUNITY)
Admission: EM | Admit: 2021-01-11 | Discharge: 2021-01-12 | Disposition: A | Discharge status: Home / Self Care | Payer: Self-pay | Attending: Emergency Medicine | Admitting: Emergency Medicine

## 2021-01-11 ENCOUNTER — Other Ambulatory Visit: Payer: Self-pay

## 2021-01-11 DIAGNOSIS — F10121 Alcohol abuse with intoxication delirium: Secondary | ICD-10-CM | POA: Insufficient documentation

## 2021-01-11 DIAGNOSIS — Z21 Asymptomatic human immunodeficiency virus [HIV] infection status: Secondary | ICD-10-CM | POA: Insufficient documentation

## 2021-01-11 DIAGNOSIS — F1010 Alcohol abuse, uncomplicated: Secondary | ICD-10-CM | POA: Insufficient documentation

## 2021-01-11 DIAGNOSIS — Z87891 Personal history of nicotine dependence: Secondary | ICD-10-CM | POA: Insufficient documentation

## 2021-01-11 DIAGNOSIS — F10921 Alcohol use, unspecified with intoxication delirium: Secondary | ICD-10-CM

## 2021-01-11 DIAGNOSIS — R451 Restlessness and agitation: Secondary | ICD-10-CM | POA: Insufficient documentation

## 2021-01-11 DIAGNOSIS — Y908 Blood alcohol level of 240 mg/100 ml or more: Secondary | ICD-10-CM | POA: Insufficient documentation

## 2021-01-11 LAB — BASIC METABOLIC PANEL
Anion gap: 13 (ref 5–15)
BUN: 9 mg/dL (ref 6–20)
CO2: 25 mmol/L (ref 22–32)
Calcium: 8.9 mg/dL (ref 8.9–10.3)
Chloride: 106 mmol/L (ref 98–111)
Creatinine, Ser: 0.74 mg/dL (ref 0.61–1.24)
GFR, Estimated: >60 mL/min (ref >60)
Glucose, Bld: 94 mg/dL (ref 70–99)
Potassium: 4 mmol/L (ref 3.5–5.1)
Sodium: 144 mmol/L (ref 135–145)

## 2021-01-11 LAB — CBC WITH DIFFERENTIAL/PLATELET
Abs Immature Granulocytes: 0 10*3/uL (ref 0.00–0.07)
Basophils Absolute: 0 10*3/uL (ref 0.0–0.1)
Basophils Relative: 1 %
Eosinophils Absolute: 0.1 10*3/uL (ref 0.0–0.5)
Eosinophils Relative: 2 %
HCT: 39.2 % (ref 39.0–52.0)
Hemoglobin: 13.1 g/dL (ref 13.0–17.0)
Immature Granulocytes: 0 %
Lymphocytes Relative: 52 %
Lymphs Abs: 2.3 10*3/uL (ref 0.7–4.0)
MCH: 32.8 pg (ref 26.0–34.0)
MCHC: 33.4 g/dL (ref 30.0–36.0)
MCV: 98.2 fL (ref 80.0–100.0)
Monocytes Absolute: 0.4 10*3/uL (ref 0.1–1.0)
Monocytes Relative: 10 %
Neutro Abs: 1.5 10*3/uL — ABNORMAL LOW (ref 1.7–7.7)
Neutrophils Relative %: 35 %
Platelets: 160 10*3/uL (ref 150–400)
RBC: 3.99 MIL/uL — ABNORMAL LOW (ref 4.22–5.81)
RDW: 14.8 % (ref 11.5–15.5)
WBC: 4.4 10*3/uL (ref 4.0–10.5)
nRBC: 0 % (ref 0.0–0.2)

## 2021-01-11 LAB — ETHANOL: Alcohol, Ethyl (B): 418 mg/dL (ref <10)

## 2021-01-11 MED ORDER — BENZTROPINE MESYLATE 1 MG PO TABS: 1.0000 mg | ORAL_TABLET | Freq: Four times a day (QID) | ORAL | Status: DC | PRN

## 2021-01-11 MED ORDER — HALOPERIDOL 5 MG PO TABS: 5.0000 mg | ORAL_TABLET | Freq: Four times a day (QID) | ORAL | Status: DC | PRN

## 2021-01-11 MED ORDER — HALOPERIDOL LACTATE 5 MG/ML IJ SOLN
10.0000 mg | Freq: Four times a day (QID) | INTRAMUSCULAR | Status: DC | PRN
  Administered 2021-01-11: 10 mg via INTRAMUSCULAR
  Filled 2021-01-11: qty 2

## 2021-01-11 NOTE — ED Notes (Signed)
Patient denies pain and is resting comfortably.  

## 2021-01-11 NOTE — ED Provider Notes (Signed)
[facility]-EMERGENCY DEPT Provider Note   CSN: [id number] Arrival date & time: 01/11/21  1902     History Chief Complaint  Patient presents with  . Alcohol Intoxication    Gary [name] is a 35 y.o. male.  HPI Patient presents same day has been seen and evaluated by colleagues, now with concern for agitation, alcohol intoxication.  Patient arrives via EMS.  They note that in route to the patient was agitated, combative.  The patient acknowledges drinking substantial months of alcohol after being seen earlier today.  He denies drug use.  He denies pain.    Past Medical History:  Diagnosis Date  . Alcohol abuse   . HIV (human immunodeficiency virus infection) (HCC)   . Subdural hematoma [facility])     Patient Active Problem List   Diagnosis Date Noted  . 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 Elevated troponin 10/19/2020  . Alcohol withdrawal (HCC) 10/19/2020  . Alcohol abuse   . Suicidal ideation 06/14/2020  . Pancreatitis 06/13/2020  . 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  . Malnutrition of moderate degree 05/07/2020  . Gastrointestinal hemorrhage   . Alcoholic hepatitis without ascites   . Melena 05/05/2020  . Alcoholic ketoacidosis 05/05/2020  . Thrombocytopenia (HCC) 07/25/2019  . Transaminitis 07/25/2019  . Traumatic subdural hematoma (HCC) 07/02/2019  . Alcohol abuse with intoxication (HCC) 07/02/2019  . Alcohol abuse with alcohol-induced mood disorder (HCC) 05/13/2019  . Alcohol dependence (HCC) 04/16/2019  . 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 HIV disease (HCC) 06/16/2018  . Polysubstance abuse (HCC) 06/16/2018  . Cigarette smoker 06/16/2018    Past Surgical History:  Procedure Laterality Date  . INCISION AND DRAINAGE PERIRECTAL ABSCESS N/A 09/24/2016   Procedure: IRRIGATION AND DEBRIDEMENT PERIRECTAL ABSCESS;  Surgeon: Ricarda Frame, MD;  Location: [facility] ORS;  Service:  General;  Laterality: N/A;  . none         Family History  Problem Relation Age of Onset  . Alcohol abuse Mother   . Alcohol abuse Father     Social History   Tobacco Use  . Smoking status: Former Smoker    Packs/day: 0.50    Types: Cigarettes  . Smokeless tobacco: Never Used  . Tobacco comment: unable to smoke while incarcerated 6+ months 02/13/20  Vaping Use  . Vaping Use: Never used  Substance Use Topics  . Alcohol use: Yes    Comment: "I drink all I can"  . Drug use: Not Currently    Types: Marijuana, Methamphetamines    Comment: last used 04/10/2019    Home Medications Prior to Admission medications   Medication Sig Start Date End Date Taking? Authorizing Provider  elvitegravir-cobicistat-emtricitabine-tenofovir (GENVOYA) 150-150-200-10 MG TABS tablet Take 1 tablet by mouth daily with breakfast. Patient not taking: No sig reported 09/28/20   Clapacs, Jackquline Denmark, MD  famotidine (PEPCID) 20 MG tablet Take 1 tablet (20 mg total) by mouth 2 (two) times daily. Patient not taking: Reported on 06/01/2019 05/14/19 06/02/19  Benjiman Core, MD    Allergies    Tegretol [carbamazepine] and Caffeine  Review of Systems   Review of Systems  Unable to perform ROS: Psychiatric disorder    Physical Exam Updated Vital Signs BP 133/80   Pulse 87   Temp 98 F (36.7 C) (Oral)   Resp 16   SpO2 100%   Physical Exam Vitals and nursing note reviewed.  Constitutional:      Comments: Restless, agitated, labile mood  HENT:     Head: Normocephalic and atraumatic.     Nose: No congestion.     Mouth/Throat:     Mouth: Mucous membranes are moist.  Eyes:     Conjunctiva/sclera: Conjunctivae normal.  Cardiovascular:     Rate and Rhythm: Normal rate and regular rhythm.     Pulses: Normal pulses.  Pulmonary:     Effort: No respiratory distress.  Musculoskeletal:        General: No deformity.     Right lower leg: No edema.     Left lower leg: No edema.     Comments: Patient moving  all 4 extremities at difficulty.  Skin:    Findings: No bruising or rash.  Neurological:     General: No focal deficit present.     Mental Status: He is alert.     Comments: Patient was somnolent on my exam, but was arousable with stimulation, he was oriented to person place, cannot tell me event or time.  Psychiatric:        Mood and Affect: Mood is anxious. Affect is labile.        Behavior: Behavior is agitated and aggressive.     ED Results / Procedures / Treatments   Labs (all labs ordered are listed, but only abnormal results are displayed) Labs Reviewed  ETHANOL  CBC WITH DIFFERENTIAL/PLATELET  BASIC METABOLIC PANEL   Procedures Procedures   Medications Ordered in ED Medications  haloperidol lactate (HALDOL) injection 10 mg (10 mg Intramuscular Given 01/11/21 1943)    ED Course  I have reviewed the triage vital signs and the nursing notes.  Pertinent labs & imaging results that were available during my care of the patient were reviewed by me and considered in my medical decision making (see chart for details).  9:29 PM Alcohol level 30  Adult male with HIV, psychiatric disease, alcohol abuse presents with several hours after being seen and evaluated.  Here patient is initially restless, agitated, agreeable to receiving IM Haldol, evaluation.  No initial physical exam findings suggesting new pathology, some suspicion for the patient's psychiatric history, addiction history contributing to his presentation.  Patient will require monitoring, repeat evaluation, may be appropriate for behavioral health evaluation versus discharge pending this, once he is sober. Final Clinical Impression(s) / ED Diagnoses Final diagnoses:  Agitation  Alcohol intoxication with delirium [facility])     Gerhard Munch, MD 01/11/21 2129

## 2021-01-11 NOTE — ED Notes (Signed)
Patient alert, sitting in chair. Nurse asked patient if he is feeling okay. Patient states "I am trying to be"  Nurse offered fluid and food. Patient declined fluids but accepted food. Upon nurse entering room with food, patient was ambulating in room.

## 2021-01-11 NOTE — ED Notes (Signed)
Nurse at bedside to assess patient. Patient sitting in chair with finger in nose. Patient states he is fine, denies pain. Vital signs obtained, WNL and documented.

## 2021-01-11 NOTE — Discharge Instructions (Addendum)
Seen for alcohol intoxication.  Exam and vital signs have remained stable.   I have given you resources for substance counseling please call as you need further evaluation.  Come back to the emergency department if you develop chest pain, shortness of breath, severe abdominal pain, uncontrolled nausea, vomiting, diarrhea.

## 2021-01-11 NOTE — ED Notes (Addendum)
Date and time results received: 01/11/21 2110 (use smartphrase ".now" to insert current time)  Test: Alcohol level Critical Value: 418  Name of Provider Notified: R.Lockwood  Orders Received? Or Actions Taken?: Orders Received - See Orders for details

## 2021-01-11 NOTE — ED Triage Notes (Signed)
Per EMS- Patient was picked up from [employer]. Security called EMS  Patient c/o nausea and pain all over. Patient states he has had only 1 beer today.

## 2021-01-11 NOTE — ED Notes (Signed)
Patient provided with another sandwich upon discharge per provider request.

## 2021-01-11 NOTE — ED Notes (Signed)
Patient ate one whole sandwich.

## 2021-01-11 NOTE — ED Provider Notes (Signed)
[facility]-EMERGENCY DEPT Provider Note   CSN: [id number] Arrival date & time: 01/11/21  1052     History Chief Complaint  Patient presents with  . Withdrawal    Gary [name] is a 35 y.o. male.  HPI   HPI will refer to level 5 caveat intoxication   Patient with significant medical history of alcohol abuse, HIV presents emergency department via EMS as he was found outside [employer] complaining of nausea and vomiting.  On my exam he was reluctant to speak, somnolent, could not obtain very much information.  He endorses that he has no pain at this time. after reviewing patient's chart patient has been seen multiple times for alcohol intoxication, most recently seen on 0302, lab work was unremarkable, CT head unremarkable.  He was later discharged home.  Past Medical History:  Diagnosis Date  . Alcohol abuse   . HIV (human immunodeficiency virus infection) (HCC)   . Subdural hematoma [facility])     Patient Active Problem List   Diagnosis Date Noted  . 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 Elevated troponin 10/19/2020  . Alcohol withdrawal (HCC) 10/19/2020  . Alcohol abuse   . Suicidal ideation 06/14/2020  . Pancreatitis 06/13/2020  . 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  . Malnutrition of moderate degree 05/07/2020  . Gastrointestinal hemorrhage   . Alcoholic hepatitis without ascites   . Melena 05/05/2020  . Alcoholic ketoacidosis 05/05/2020  . Thrombocytopenia (HCC) 07/25/2019  . Transaminitis 07/25/2019  . Traumatic subdural hematoma (HCC) 07/02/2019  . Alcohol abuse with intoxication (HCC) 07/02/2019  . Alcohol abuse with alcohol-induced mood disorder (HCC) 05/13/2019  . Alcohol dependence (HCC) 04/16/2019  . 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 HIV disease (HCC) 06/16/2018  . Polysubstance abuse (HCC) 06/16/2018  . Cigarette smoker 06/16/2018    Past Surgical History:   Procedure Laterality Date  . INCISION AND DRAINAGE PERIRECTAL ABSCESS N/A 09/24/2016   Procedure: IRRIGATION AND DEBRIDEMENT PERIRECTAL ABSCESS;  Surgeon: Ricarda Frame, MD;  Location: [facility] ORS;  Service: General;  Laterality: N/A;  . none         Family History  Problem Relation Age of Onset  . Alcohol abuse Mother   . Alcohol abuse Father     Social History   Tobacco Use  . Smoking status: Former Smoker    Packs/day: 0.50    Types: Cigarettes  . Smokeless tobacco: Never Used  . Tobacco comment: unable to smoke while incarcerated 6+ months 02/13/20  Vaping Use  . Vaping Use: Never used  Substance Use Topics  . Alcohol use: Yes    Comment: "I drink all I can"  . Drug use: Not Currently    Types: Marijuana, Methamphetamines    Comment: last used 04/10/2019    Home Medications Prior to Admission medications   Medication Sig Start Date End Date Taking? Authorizing Provider  elvitegravir-cobicistat-emtricitabine-tenofovir (GENVOYA) 150-150-200-10 MG TABS tablet Take 1 tablet by mouth daily with breakfast. Patient not taking: No sig reported 09/28/20   Clapacs, Jackquline Denmark, MD  famotidine (PEPCID) 20 MG tablet Take 1 tablet (20 mg total) by mouth 2 (two) times daily. Patient not taking: Reported on 06/01/2019 05/14/19 06/02/19  Benjiman Core, MD    Allergies    Tegretol [carbamazepine] and Caffeine  Review of Systems   Review of Systems  Unable to perform ROS: Other    Physical Exam Updated Vital Signs BP 110/63   Pulse 67   Temp  98.3 F (36.8 C) (Oral)   Resp 16   SpO2 96%   Physical Exam Vitals and nursing note reviewed.  Constitutional:      General: He is not in acute distress.    Appearance: He is not ill-appearing.     Comments: Patient smells of alcohol  HENT:     Head: Normocephalic and atraumatic.     Nose: No congestion.     Mouth/Throat:     Mouth: Mucous membranes are moist.     Pharynx: Oropharynx is clear. No oropharyngeal exudate or posterior  oropharyngeal erythema.  Eyes:     Conjunctiva/sclera: Conjunctivae normal.  Cardiovascular:     Rate and Rhythm: Normal rate and regular rhythm.     Pulses: Normal pulses.     Heart sounds: No murmur heard. No friction rub. No gallop.   Pulmonary:     Effort: No respiratory distress.     Breath sounds: No wheezing, rhonchi or rales.  Abdominal:     Palpations: Abdomen is soft.     Tenderness: There is no abdominal tenderness.  Musculoskeletal:     Right lower leg: No edema.     Left lower leg: No edema.     Comments: Patient moving all 4 extremities at difficulty.  Skin:    General: Skin is warm and dry.  Neurological:     Mental Status: He is alert.     Comments: Patient was somnolent on my exam, but was arousable with stimulation, he was oriented to person place, cannot tell me event or time.  Psychiatric:        Mood and Affect: Mood normal.     ED Results / Procedures / Treatments   Labs (all labs ordered are listed, but only abnormal results are displayed) Labs Reviewed - No data to display  EKG None  Radiology No results found.  Procedures Procedures   Medications Ordered in ED Medications - No data to display  ED Course  I have reviewed the triage vital signs and the nursing notes.  Pertinent labs & imaging results that were available during my care of the patient were reviewed by me and considered in my medical decision making (see chart for details).    MDM Rules/Calculators/A&P                          Initial impression-suspect patient is having from alcohol intoxication, patient was not oriented x4, will allow patient to metabolize and reassess.  Work-up-will defer lab or imaging at this time as exam is benign, patient recently had lab work imaging performed 2 days ago which were unremarkable.  Reassessment patient is reassessed after allowing him to metabolize.  He is more alert, states his only complaint was abdominal pain but he feel much  better.  He tolerated p.o. without difficulty.  He is ambulating around the room without difficulty.  Patient states he is ready go home.  Rule out-low suspicion for ACS as patient has chest pain, shortness of breath, no signs of hypoperfusion fluid overload on my exam, vital signs remained stable.  Low suspicion for pneumonia or rib fracture as ribs were palpated nontender to palpation, lung sounds are clear bilaterally.  Low suspicion for intra-abdominal abnormality as abdomen soft nontender to palpation, tolerating p.o. without difficulty.  Low suspicion for psychiatric emergency as patient denies hallucinations or delusions, denies homicidal or suicidal ideations.  Plan-suspect patient's having from acute intoxication from alcohol.  Will provide  patient with resources for substance abuse counseling and follow-up.  Vital signs have remained stable, no indication for hospital admission.  Patient discussed with attending and they agreed with assessment and plan.  Patient given at home care as well strict return precautions.  Patient verbalized that they understood agreed to said plan.   Final Clinical Impression(s) / ED Diagnoses Final diagnoses:  Alcohol abuse    Rx / DC Orders ED Discharge Orders    None       Carroll Sage, PA-C 01/11/21 1432    Lorre Nick, MD 01/15/21 1020

## 2021-01-11 NOTE — ED Triage Notes (Signed)
Per EMS pt found in [employer] bathroom with bottle of wine. Pt poor historian.

## 2021-01-12 ENCOUNTER — Encounter (HOSPITAL_COMMUNITY): Payer: Self-pay

## 2021-01-12 ENCOUNTER — Emergency Department (HOSPITAL_COMMUNITY)
Admission: EM | Admit: 2021-01-12 | Discharge: 2021-01-12 | Disposition: A | Discharge status: Home / Self Care | Payer: Self-pay | Attending: Emergency Medicine | Admitting: Emergency Medicine

## 2021-01-12 ENCOUNTER — Emergency Department (HOSPITAL_COMMUNITY): Payer: Self-pay

## 2021-01-12 DIAGNOSIS — Z87891 Personal history of nicotine dependence: Secondary | ICD-10-CM | POA: Insufficient documentation

## 2021-01-12 DIAGNOSIS — Z21 Asymptomatic human immunodeficiency virus [HIV] infection status: Secondary | ICD-10-CM | POA: Insufficient documentation

## 2021-01-12 DIAGNOSIS — F10930 Alcohol use, unspecified with withdrawal, uncomplicated: Secondary | ICD-10-CM

## 2021-01-12 DIAGNOSIS — Y905 Blood alcohol level of 100-119 mg/100 ml: Secondary | ICD-10-CM | POA: Insufficient documentation

## 2021-01-12 DIAGNOSIS — R569 Unspecified convulsions: Secondary | ICD-10-CM | POA: Insufficient documentation

## 2021-01-12 DIAGNOSIS — F10230 Alcohol dependence with withdrawal, uncomplicated: Secondary | ICD-10-CM

## 2021-01-12 DIAGNOSIS — F10239 Alcohol dependence with withdrawal, unspecified: Secondary | ICD-10-CM | POA: Insufficient documentation

## 2021-01-12 LAB — ETHANOL: Alcohol, Ethyl (B): 103 mg/dL — ABNORMAL HIGH (ref <10)

## 2021-01-12 LAB — COMPREHENSIVE METABOLIC PANEL
ALT: 66 U/L — ABNORMAL HIGH (ref 0–44)
AST: 95 U/L — ABNORMAL HIGH (ref 15–41)
Albumin: 4.1 g/dL (ref 3.5–5.0)
Alkaline Phosphatase: 44 U/L (ref 38–126)
Anion gap: 17 — ABNORMAL HIGH (ref 5–15)
BUN: 9 mg/dL (ref 6–20)
CO2: 21 mmol/L — ABNORMAL LOW (ref 22–32)
Calcium: 8.8 mg/dL — ABNORMAL LOW (ref 8.9–10.3)
Chloride: 104 mmol/L (ref 98–111)
Creatinine, Ser: 0.7 mg/dL (ref 0.61–1.24)
GFR, Estimated: >60 mL/min (ref >60)
Glucose, Bld: 71 mg/dL (ref 70–99)
Potassium: 3.6 mmol/L (ref 3.5–5.1)
Sodium: 142 mmol/L (ref 135–145)
Total Bilirubin: 1.1 mg/dL (ref 0.3–1.2)
Total Protein: 7.2 g/dL (ref 6.5–8.1)

## 2021-01-12 LAB — CBC WITH DIFFERENTIAL/PLATELET
Abs Immature Granulocytes: 0.01 10*3/uL (ref 0.00–0.07)
Basophils Absolute: 0 10*3/uL (ref 0.0–0.1)
Basophils Relative: 1 %
Eosinophils Absolute: 0 10*3/uL (ref 0.0–0.5)
Eosinophils Relative: 0 %
HCT: 39.6 % (ref 39.0–52.0)
Hemoglobin: 13.3 g/dL (ref 13.0–17.0)
Immature Granulocytes: 0 %
Lymphocytes Relative: 12 %
Lymphs Abs: 0.4 10*3/uL — ABNORMAL LOW (ref 0.7–4.0)
MCH: 33.2 pg (ref 26.0–34.0)
MCHC: 33.6 g/dL (ref 30.0–36.0)
MCV: 98.8 fL (ref 80.0–100.0)
Monocytes Absolute: 0.3 10*3/uL (ref 0.1–1.0)
Monocytes Relative: 9 %
Neutro Abs: 2.7 10*3/uL (ref 1.7–7.7)
Neutrophils Relative %: 78 %
Platelets: 127 10*3/uL — ABNORMAL LOW (ref 150–400)
RBC: 4.01 MIL/uL — ABNORMAL LOW (ref 4.22–5.81)
RDW: 14.6 % (ref 11.5–15.5)
WBC: 3.5 10*3/uL — ABNORMAL LOW (ref 4.0–10.5)
nRBC: 0 % (ref 0.0–0.2)

## 2021-01-12 LAB — RAPID URINE DRUG SCREEN, HOSP PERFORMED
Amphetamines: NOT DETECTED
Barbiturates: NOT DETECTED
Benzodiazepines: POSITIVE — AB
Cocaine: NOT DETECTED
Opiates: NOT DETECTED
Tetrahydrocannabinol: NOT DETECTED

## 2021-01-12 LAB — CBG MONITORING, ED
Glucose-Capillary: 53 mg/dL — ABNORMAL LOW (ref 70–99)
Glucose-Capillary: 99 mg/dL (ref 70–99)

## 2021-01-12 IMAGING — CT CT HEAD W/O CM
3 series · 15 of 47 positions shown, 18 images · non-contrast
Comparison: [DATE] head CT, [DATE] cervical spine CT and
prior studies

CLINICAL DATA: 34-year-old male with head and neck injury, pain
following seizure.

EXAM:
CT HEAD WITHOUT CONTRAST
CT CERVICAL SPINE WITHOUT CONTRAST
TECHNIQUE: Multidetector CT imaging of the head and cervical spine was
performed following the standard protocol without intravenous
contrast. Multiplanar CT image reconstructions of the cervical spine
were also generated.

[Series 4: head wo · axial · 0.47mm/px · z∈[-155,-5]mm · 9 of 36 slices shown, 12 images]
[im 3/36  brain]
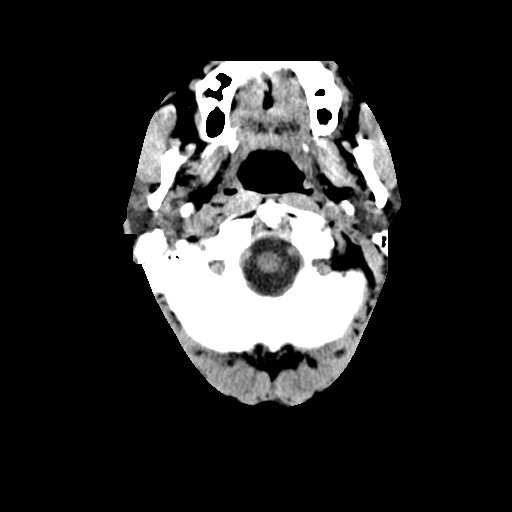
[im 3/36  bone]
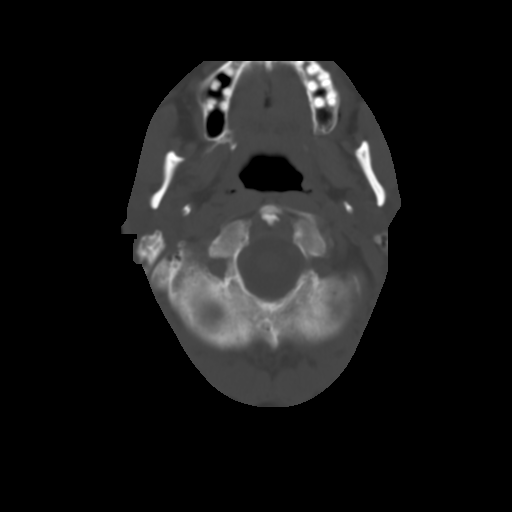
[im 7/36  brain]
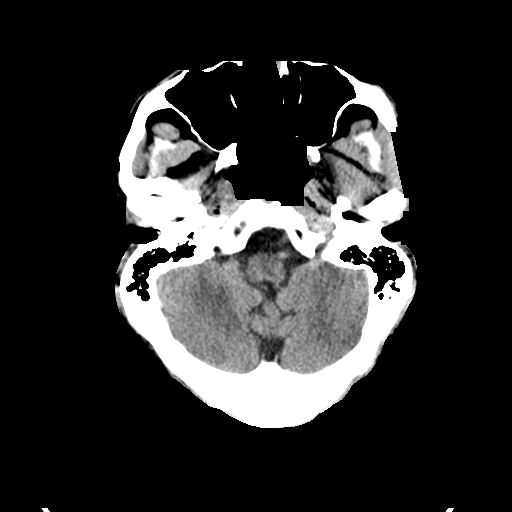
[im 10/36  brain]
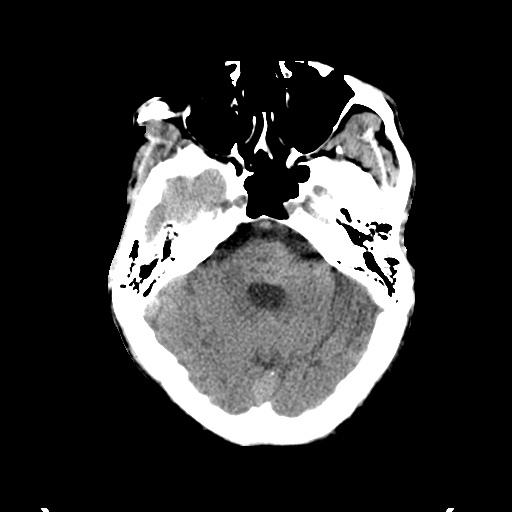
[im 14/36  brain]
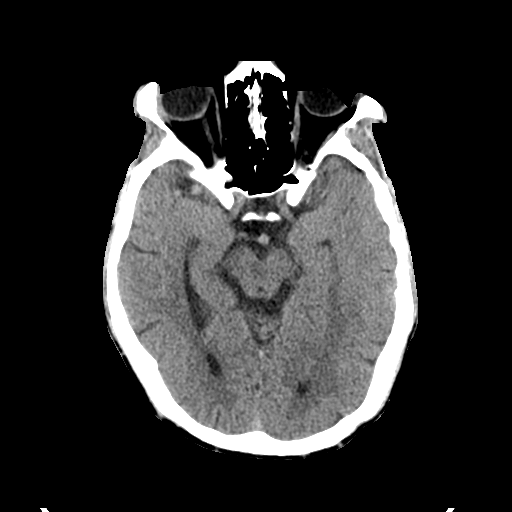
[im 19/36  brain]
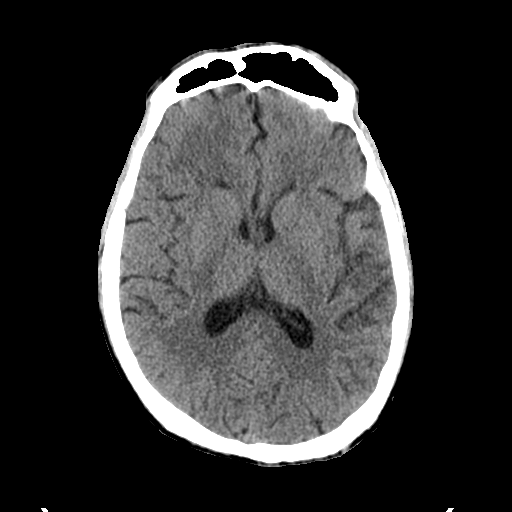
[im 19/36  bone]
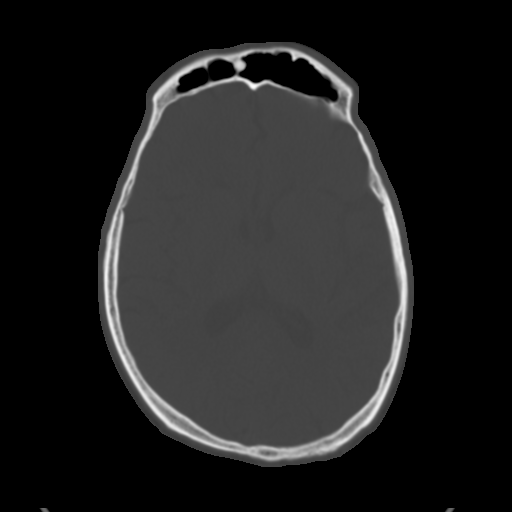
[im 22/36  brain]
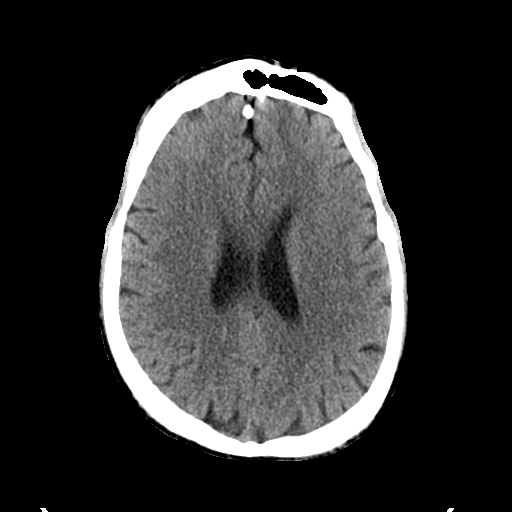
[im 26/36  brain]
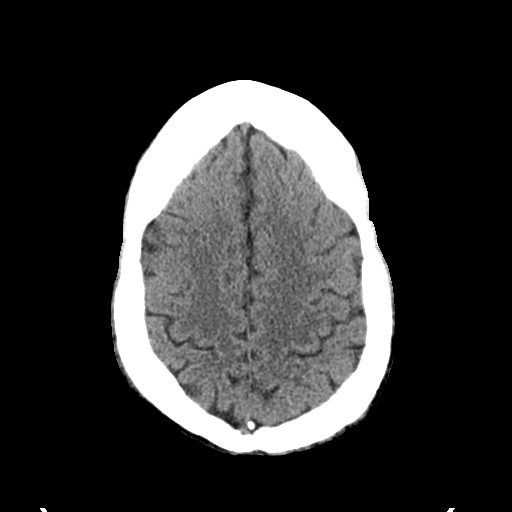
[im 29/36  brain]
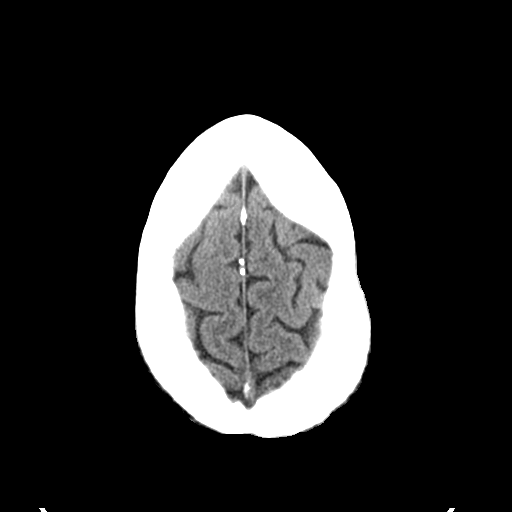
[im 33/36  brain]
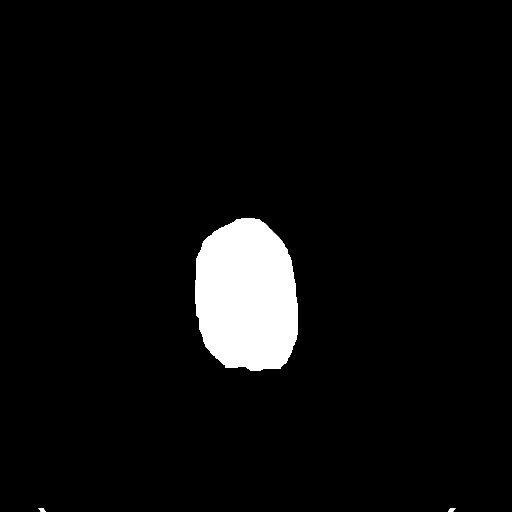
[im 33/36  bone]
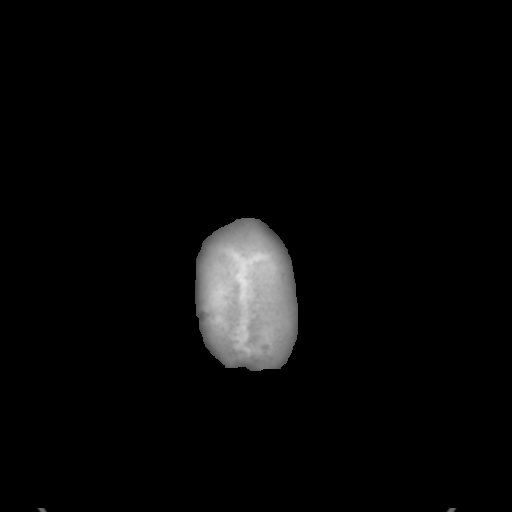

[Series 6: coronal soft tissue · coronal · 0.37mm/px · 3 of 84 slices shown]
[im 28/84  brain]
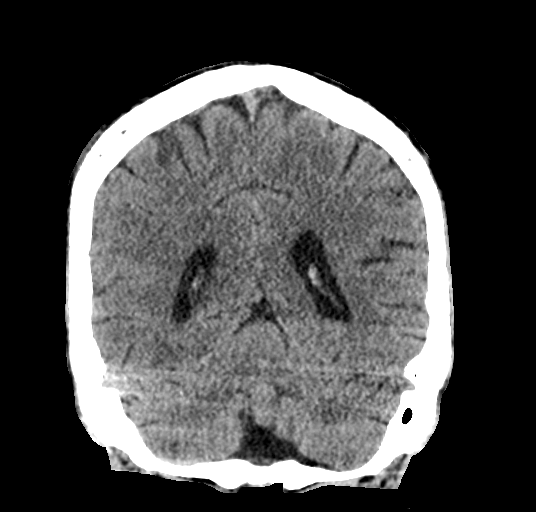
[im 37/84  brain]
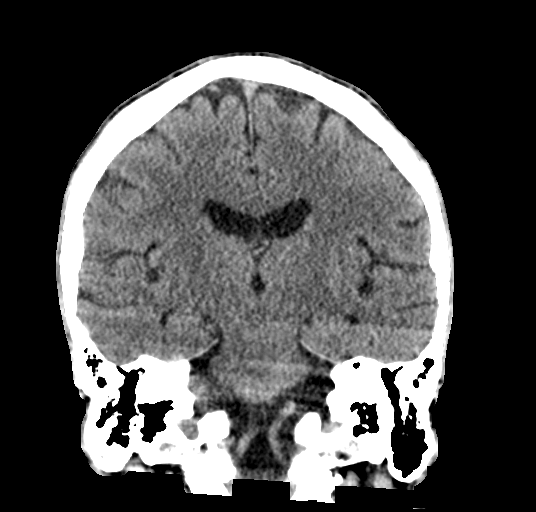
[im 47/84  brain]
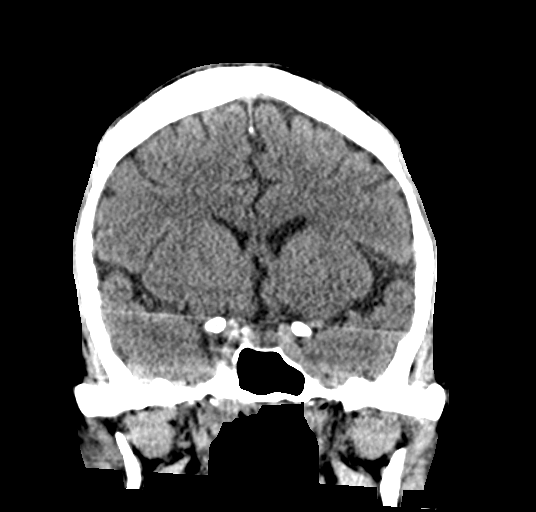

[Series 7: sagittal soft tissue · sagittal · 0.39mm/px · 3 of 55 slices shown]
[im 19/55  brain]
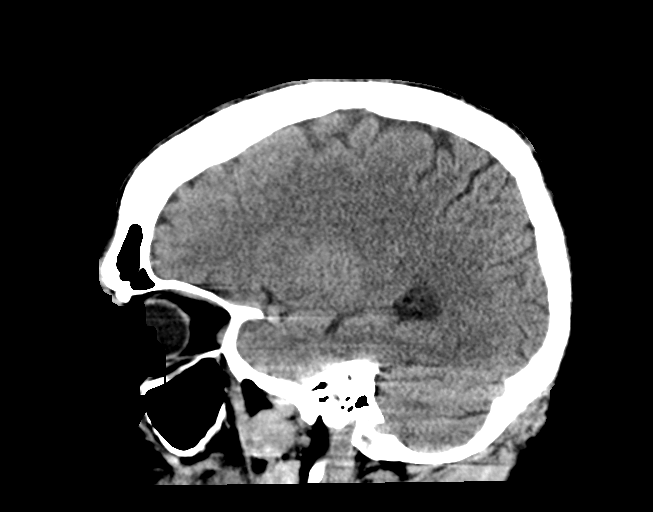
[im 28/55  brain]
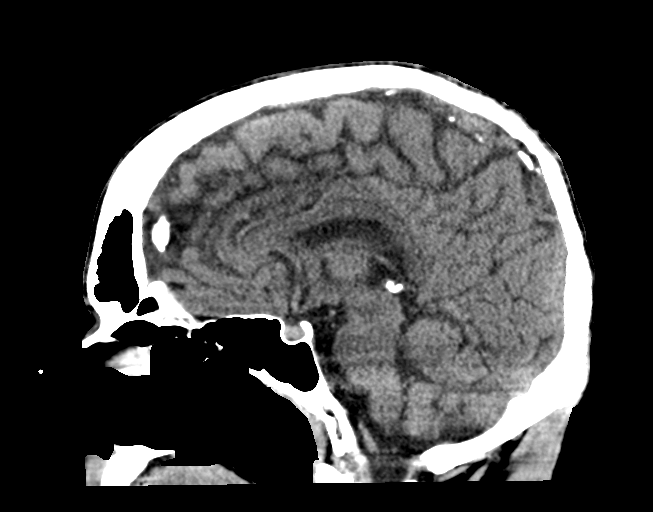
[im 37/55  brain]
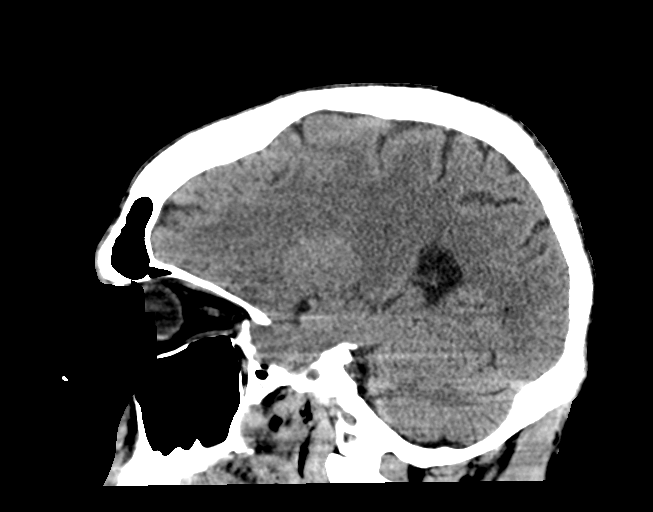

[15 of 47 positions shown; findings below may reference images not displayed]

FINDINGS: CT HEAD FINDINGS

Brain: No evidence of acute infarction, hemorrhage, hydrocephalus,
extra-axial collection or mass lesion/mass effect.

Vascular: No hyperdense vessel or unexpected calcification.

Skull: Normal. Negative for fracture or focal lesion.

Sinuses/Orbits: No acute finding.

Other: None.

CT CERVICAL SPINE FINDINGS

Alignment: Unchanged without acute subluxation.

Skull base and vertebrae: No acute fracture. No primary bone lesion
or focal pathologic process.

Soft tissues and spinal canal: No prevertebral fluid or swelling. No
visible canal hematoma.

Disc levels:  No significant change or abnormality

Upper chest: Unremarkable

Other: None
IMPRESSION: 1. Unremarkable noncontrast head CT.
2. No static evidence of acute injury to the cervical spine.

## 2021-01-12 IMAGING — CT CT CERVICAL SPINE W/O CM
3 of 4 series · 13 of 33 positions shown, 16 images · non-contrast
Comparison: [DATE] head CT, [DATE] cervical spine CT and
prior studies

CLINICAL DATA: 34-year-old male with head and neck injury, pain
following seizure.

EXAM:
CT HEAD WITHOUT CONTRAST
CT CERVICAL SPINE WITHOUT CONTRAST
TECHNIQUE: Multidetector CT imaging of the head and cervical spine was
performed following the standard protocol without intravenous
contrast. Multiplanar CT image reconstructions of the cervical spine
were also generated.

[Series 6: orthogonal bone · axial · 0.30mm/px · z∈[-335,-175]mm · 5 of 124 slices shown, 7 images]
[im 21/124  soft-tissue]
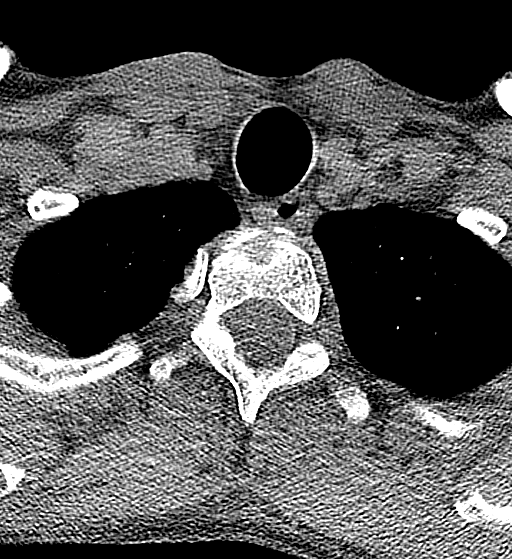
[im 21/124  bone]
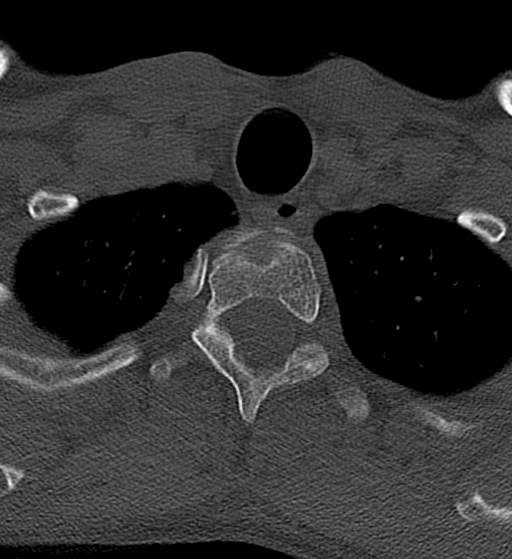
[im 42/124  bone]
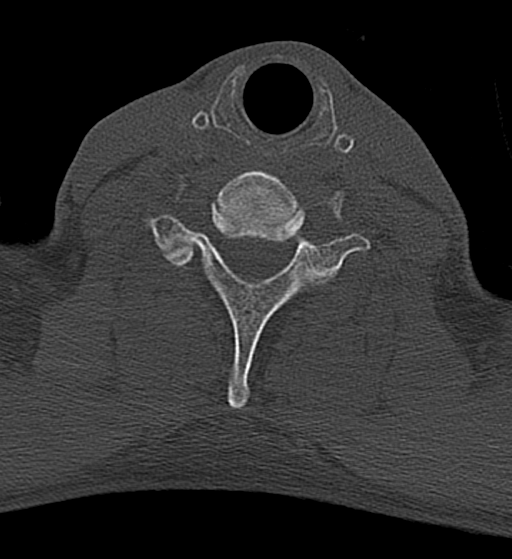
[im 62/124  bone]
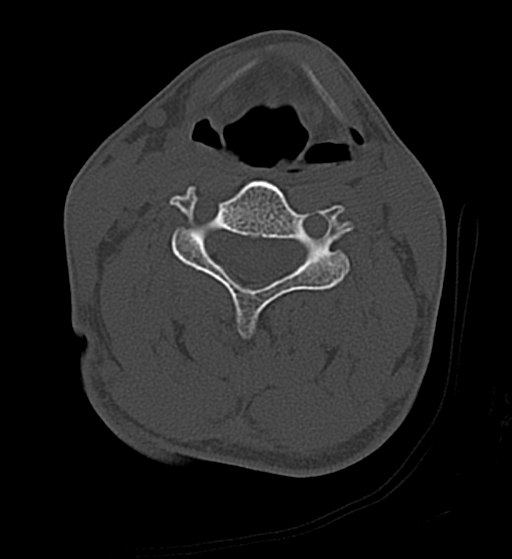
[im 83/124  bone]
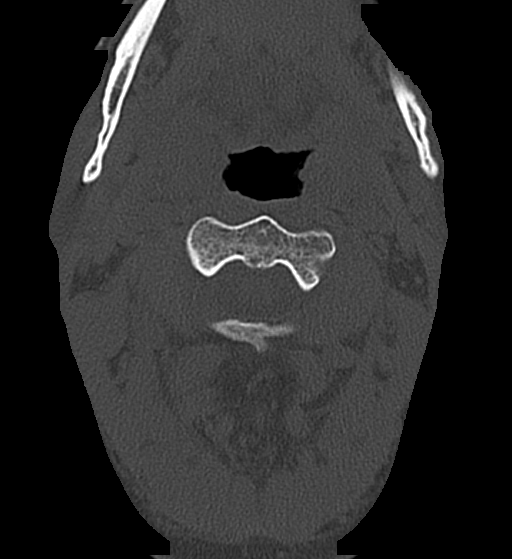
[im 103/124  soft-tissue]
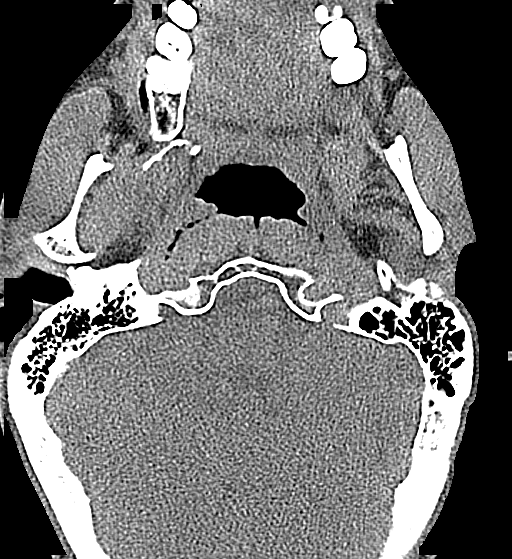
[im 103/124  bone]
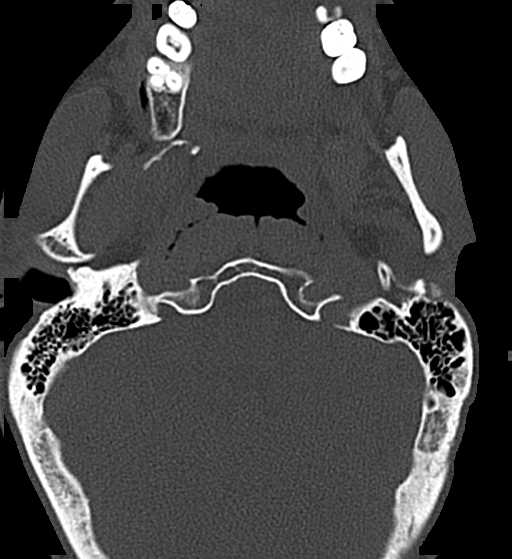

[Series 7: coronal bone · coronal · 0.32mm/px · 3 of 61 slices shown]
[im 13/61  bone]
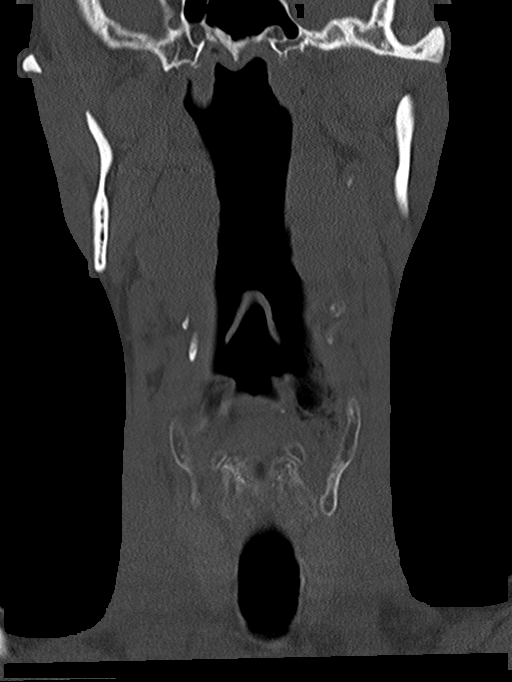
[im 25/61  bone]
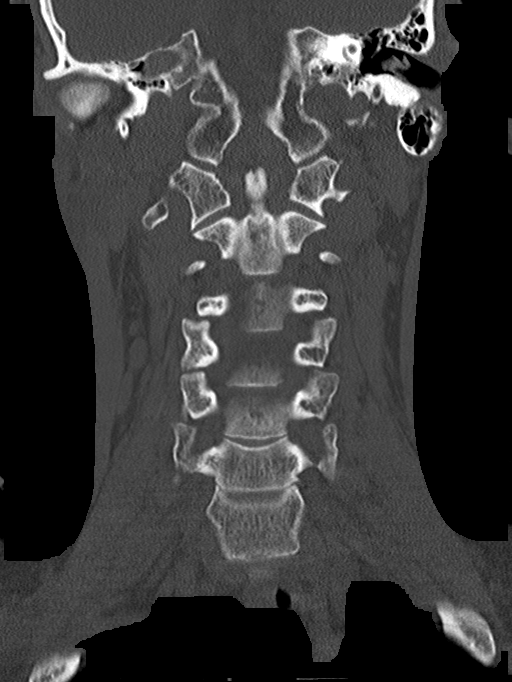
[im 37/61  bone]
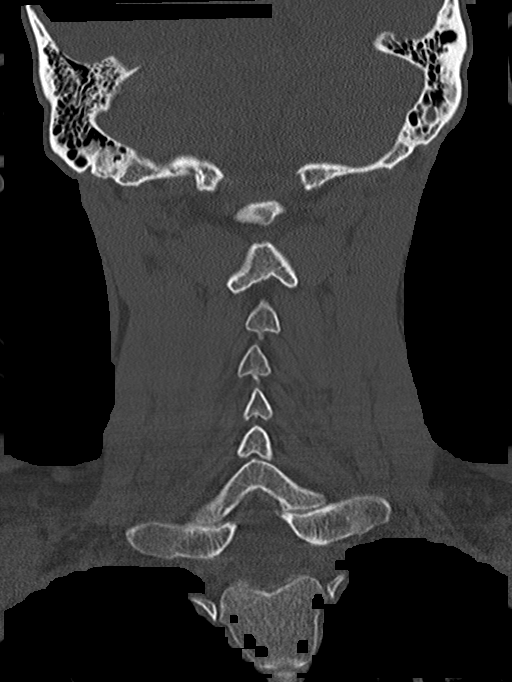

[Series 8: sagittal bone · sagittal · 0.32mm/px · 5 of 61 slices shown, 6 images]
[im 21/61  bone]
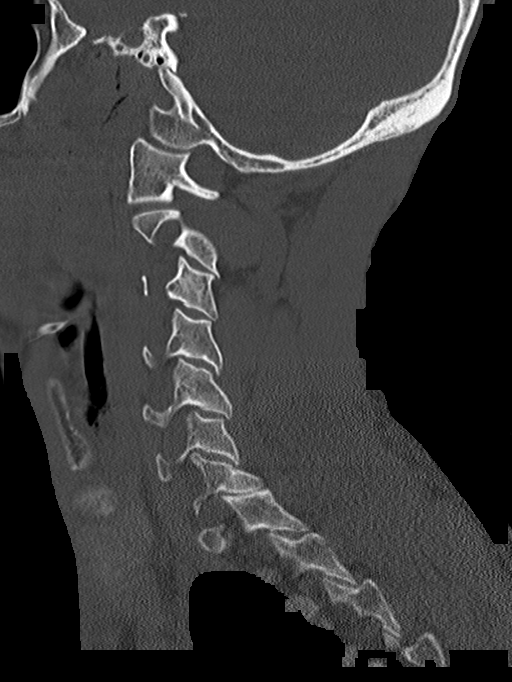
[im 26/61  bone]
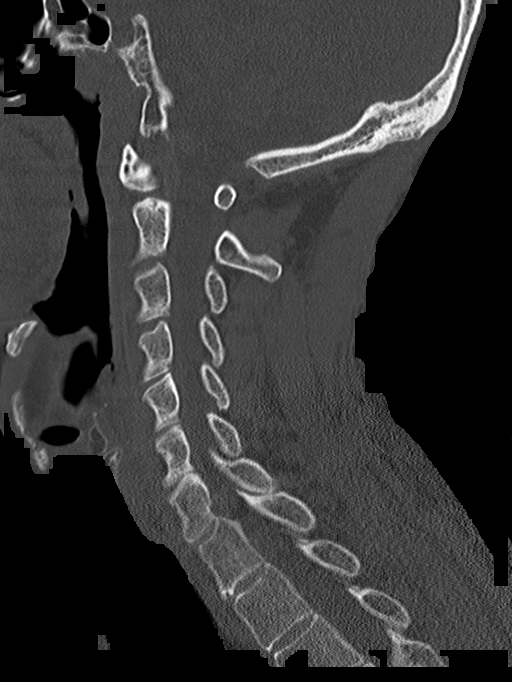
[im 31/61  soft-tissue]
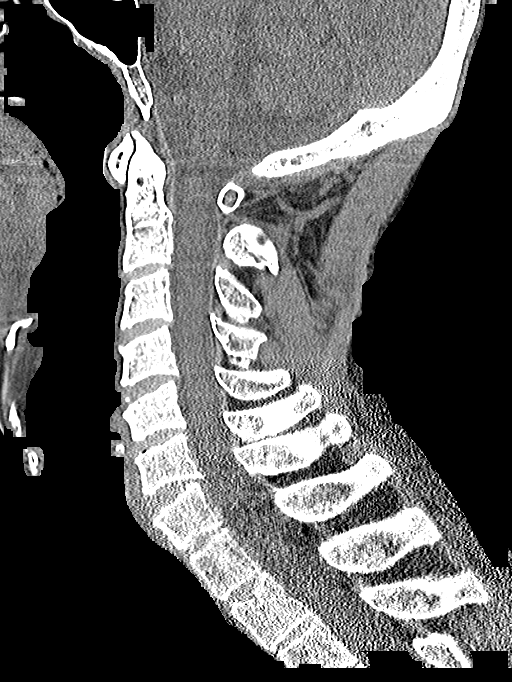
[im 31/61  bone]
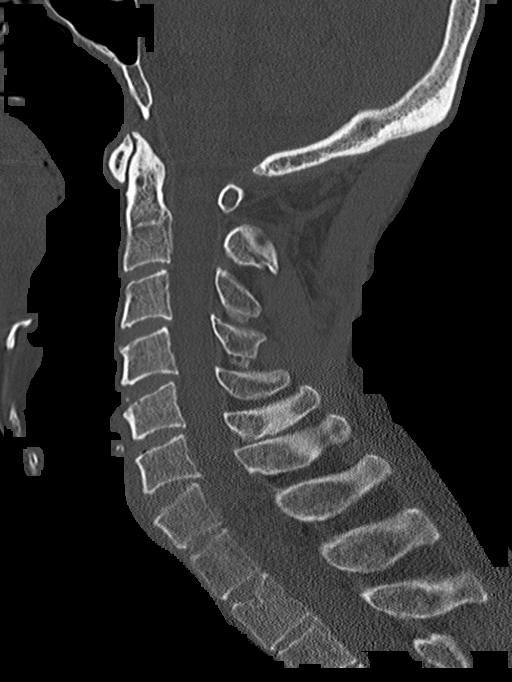
[im 36/61  bone]
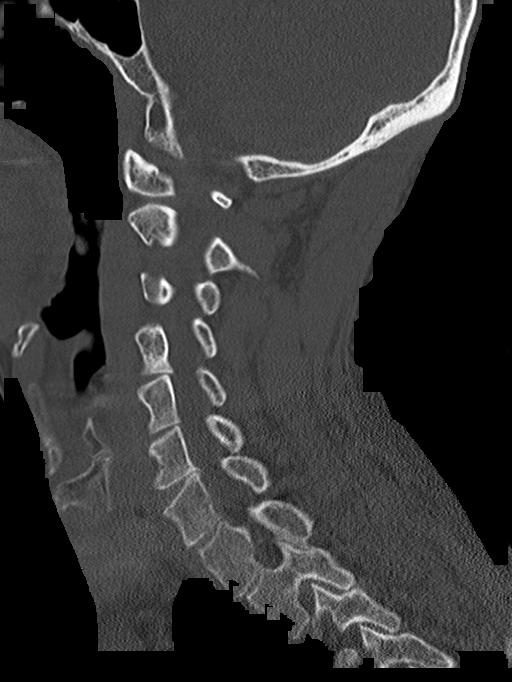
[im 41/61  bone]
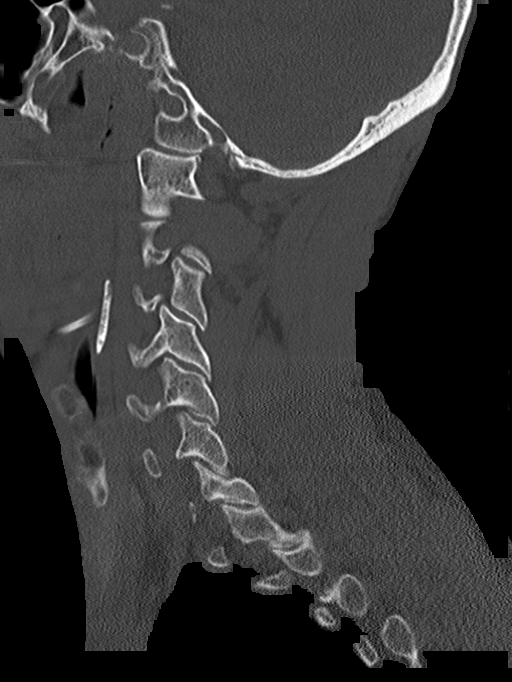

[13 of 33 positions shown; findings below may reference images not displayed]

FINDINGS: CT HEAD FINDINGS

Brain: No evidence of acute infarction, hemorrhage, hydrocephalus,
extra-axial collection or mass lesion/mass effect.

Vascular: No hyperdense vessel or unexpected calcification.

Skull: Normal. Negative for fracture or focal lesion.

Sinuses/Orbits: No acute finding.

Other: None.

CT CERVICAL SPINE FINDINGS

Alignment: Unchanged without acute subluxation.

Skull base and vertebrae: No acute fracture. No primary bone lesion
or focal pathologic process.

Soft tissues and spinal canal: No prevertebral fluid or swelling. No
visible canal hematoma.

Disc levels:  No significant change or abnormality

Upper chest: Unremarkable

Other: None
IMPRESSION: 1. Unremarkable noncontrast head CT.
2. No static evidence of acute injury to the cervical spine.

## 2021-01-12 IMAGING — DX DG CHEST 1V PORT
2 series · 2 of 2 positions shown · non-contrast
Comparison: None.

CLINICAL DATA: Patient was found outside of [REDACTED]
actively seizing on EMS arrival. H/o HIV, alcohol abuse. Former
smoker. seizure

EXAM:
PORTABLE CHEST 1 VIEW

[chest ap (1 of 2)]
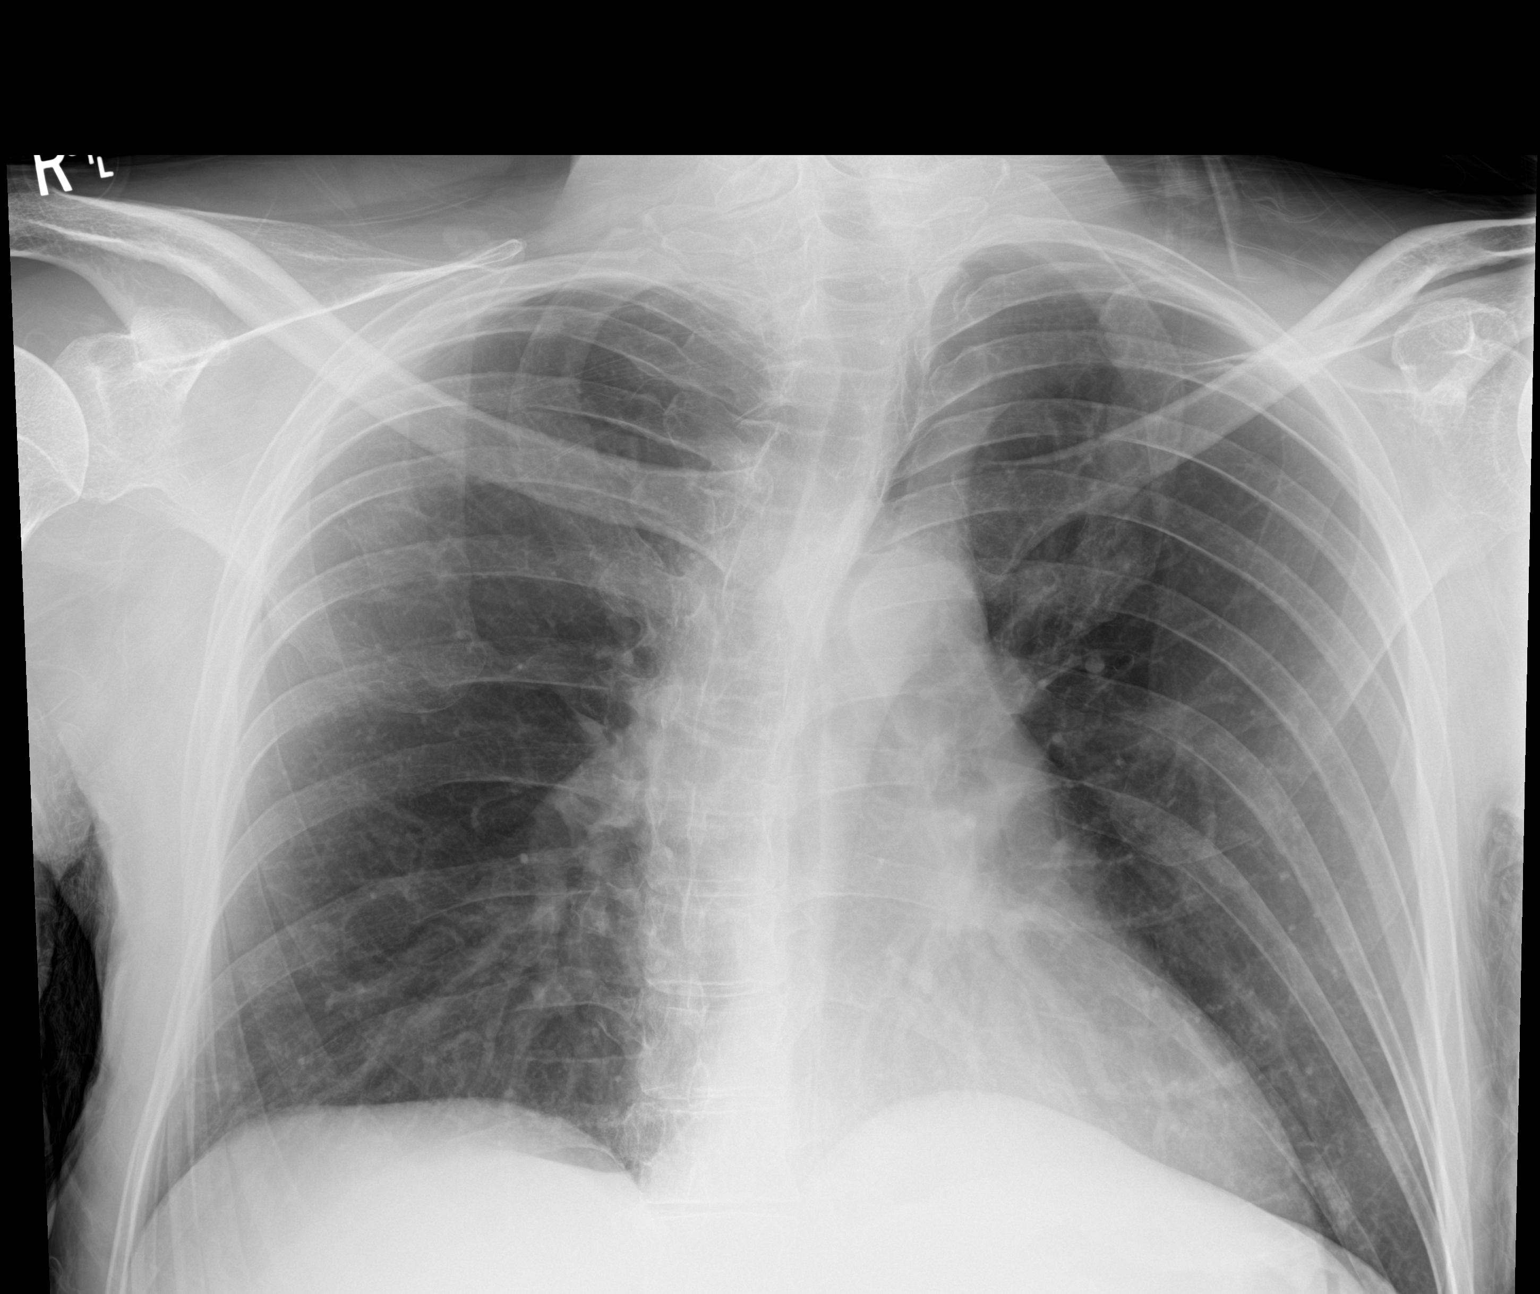

[chest ap (2 of 2)]
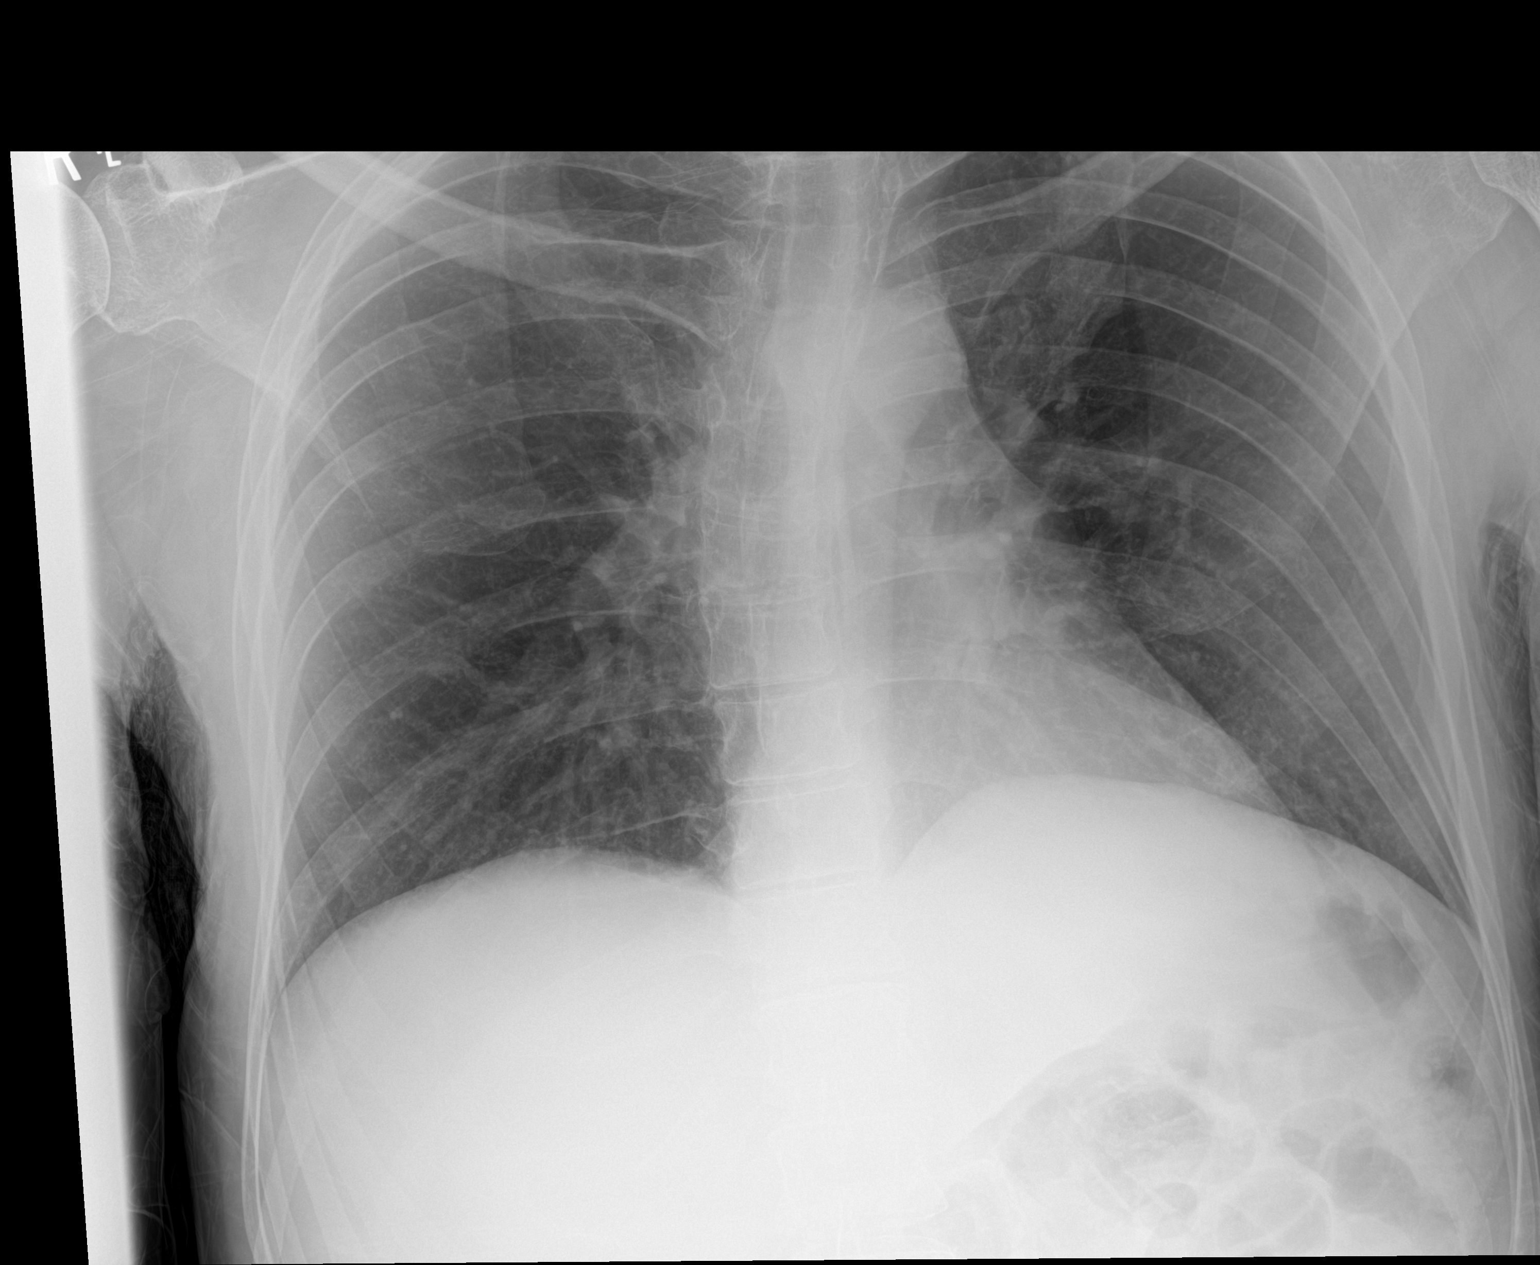

[2 of 2 positions shown; findings below may reference images not displayed]

FINDINGS: Normal mediastinum and cardiac silhouette. Normal pulmonary
vasculature. No evidence of effusion, infiltrate, or pneumothorax.
No acute bony abnormality.

Sigmoid scoliosis of the spine.
IMPRESSION: No acute cardiopulmonary process.

## 2021-01-12 MED ORDER — LORAZEPAM 2 MG/ML IJ SOLN: 0.0000 mg | Freq: Two times a day (BID) | INTRAMUSCULAR | Status: DC

## 2021-01-12 MED ORDER — THIAMINE HCL 100 MG/ML IJ SOLN
100.0000 mg | Freq: Once | INTRAMUSCULAR | Status: AC
  Administered 2021-01-12: 100 mg via INTRAVENOUS
  Filled 2021-01-12: qty 2

## 2021-01-12 MED ORDER — LORAZEPAM 2 MG/ML IJ SOLN
0.0000 mg | Freq: Four times a day (QID) | INTRAMUSCULAR | Status: DC
  Administered 2021-01-12: 2 mg via INTRAVENOUS
  Filled 2021-01-12: qty 1

## 2021-01-12 MED ORDER — THIAMINE HCL 100 MG/ML IJ SOLN: 100.0000 mg | Freq: Every day | INTRAMUSCULAR | Status: DC

## 2021-01-12 MED ORDER — CHLORDIAZEPOXIDE HCL 25 MG PO CAPS
50.0000 mg | ORAL_CAPSULE | Freq: Once | ORAL | Status: AC
  Administered 2021-01-12: 50 mg via ORAL
  Filled 2021-01-12: qty 2

## 2021-01-12 MED ORDER — DEXTROSE 50 % IV SOLN
INTRAVENOUS | Status: AC
  Filled 2021-01-12: qty 50

## 2021-01-12 MED ORDER — LORAZEPAM 1 MG PO TABS: 0.0000 mg | ORAL_TABLET | Freq: Two times a day (BID) | ORAL | Status: DC

## 2021-01-12 MED ORDER — DEXTROSE 50 % IV SOLN
1.0000 | Freq: Once | INTRAVENOUS | Status: AC
  Administered 2021-01-12: 50 mL via INTRAVENOUS

## 2021-01-12 MED ORDER — THIAMINE HCL 100 MG PO TABS: 100.0000 mg | ORAL_TABLET | Freq: Every day | ORAL | Status: DC

## 2021-01-12 MED ORDER — LORAZEPAM 1 MG PO TABS: 0.0000 mg | ORAL_TABLET | Freq: Four times a day (QID) | ORAL | Status: DC

## 2021-01-12 MED ORDER — CHLORDIAZEPOXIDE HCL 25 MG PO CAPS: ORAL_CAPSULE | ORAL | 0 refills | Status: DC

## 2021-01-12 NOTE — ED Provider Notes (Signed)
[facility]-EMERGENCY DEPT Provider Note   CSN: [id number] Arrival date & time: 01/12/21  1150     History Chief Complaint  Patient presents with  . Seizures  . Withdrawal    Gary [name] is a 35 y.o. male with a history of alcohol use disorder, HIV, polysubstance use, alcoholic hepatitis, traumatic subdural hematoma, major depressive disorder, suicidal ideations.  Per triage note patient was found outside of from the center actively seizing upon EMS arrival.  Patient was given 5 mg Versed IM with resolution of seizures.    Upon my assessment patient does somnolent but arousable to voice.  Found to be alert to person place.  Level 5 caveat due to postictal state versus obtunded from Versed.  Patient reports that he has not had any alcohol or illicit drugs since his discharge this morning.  Patient denies taking any medication for seizures.  Patient denies any pain or injuries.  States "my legs quit working so I sat down."    Per chart review patient has been seen multiple times over the last 4 weeks for complaints of alcohol intoxication, alcohol withdrawal, and suicidal ideation.  Was recently patient was [facility] from 1052 and discharged this morning for overnight observation.  Ethanol level yesterday was noted to be 418.    HPI     Past Medical History:  Diagnosis Date  . Alcohol abuse   . HIV (human immunodeficiency virus infection) (HCC)   . Subdural hematoma [facility](HCC)     Patient Active Problem List   Diagnosis Date Noted  . 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 Elevated troponin 10/19/2020  . Alcohol withdrawal (HCC) 10/19/2020  . Alcohol abuse   . Suicidal ideation 06/14/2020  . Pancreatitis 06/13/2020  . 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  . Malnutrition of moderate degree 05/07/2020  . Gastrointestinal hemorrhage   . Alcoholic hepatitis without ascites   . Melena 05/05/2020  . Alcoholic  ketoacidosis 05/05/2020  . Thrombocytopenia (HCC) 07/25/2019  . Transaminitis 07/25/2019  . Traumatic subdural hematoma (HCC) 07/02/2019  . Alcohol abuse with intoxication (HCC) 07/02/2019  . Alcohol abuse with alcohol-induced mood disorder (HCC) 05/13/2019  . Alcohol dependence (HCC) 04/16/2019  . 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 HIV disease (HCC) 06/16/2018  . Polysubstance abuse (HCC) 06/16/2018  . Cigarette smoker 06/16/2018    Past Surgical History:  Procedure Laterality Date  . INCISION AND DRAINAGE PERIRECTAL ABSCESS N/A 09/24/2016   Procedure: IRRIGATION AND DEBRIDEMENT PERIRECTAL ABSCESS;  Surgeon: Ricarda Frameharles Woodham, MD;  Location: [facility] ORS;  Service: General;  Laterality: N/A;  . none         Family History  Problem Relation Age of Onset  . Alcohol abuse Mother   . Alcohol abuse Father     Social History   Tobacco Use  . Smoking status: Former Smoker    Packs/day: 0.50    Types: Cigarettes  . Smokeless tobacco: Never Used  . Tobacco comment: unable to smoke while incarcerated 6+ months 02/13/20  Vaping Use  . Vaping Use: Never used  Substance Use Topics  . Alcohol use: Yes    Comment: "I drink all I can"  . Drug use: Not Currently    Types: Marijuana, Methamphetamines    Comment: last used 04/10/2019    Home Medications Prior to Admission medications   Medication Sig Start Date End Date Taking? Authorizing Provider  elvitegravir-cobicistat-emtricitabine-tenofovir (GENVOYA) 150-150-200-10 MG TABS tablet  Take 1 tablet by mouth daily with breakfast. Patient not taking: No sig reported 09/28/20   Clapacs, Jackquline Denmark, MD  famotidine (PEPCID) 20 MG tablet Take 1 tablet (20 mg total) by mouth 2 (two) times daily. Patient not taking: Reported on 06/01/2019 05/14/19 06/02/19  Benjiman Core, MD    Allergies    Tegretol [carbamazepine] and Caffeine  Review of Systems   Review of Systems  Unable to perform ROS:  Mental status change    Physical Exam Updated Vital Signs There were no vitals taken for this visit.  Physical Exam Vitals and nursing note reviewed.  Constitutional:      General: He is not in acute distress.    Appearance: He is not ill-appearing, toxic-appearing or diaphoretic.  HENT:     Head: Normocephalic and atraumatic. No raccoon eyes, Battle's sign, abrasion, contusion, masses, right periorbital erythema, left periorbital erythema or laceration.     Jaw: No trismus or pain on movement.     Mouth/Throat:     Mouth: Mucous membranes are moist. No injury or lacerations.  Eyes:     General: No scleral icterus.       Right eye: No discharge.        Left eye: No discharge.     Extraocular Movements: Extraocular movements intact.     Pupils: Pupils are equal, round, and reactive to light.  Cardiovascular:     Rate and Rhythm: Normal rate.     Heart sounds: Normal heart sounds.  Pulmonary:     Effort: Pulmonary effort is normal. No respiratory distress.     Breath sounds: Normal breath sounds. No stridor. No wheezing, rhonchi or rales.  Abdominal:     General: There is no distension. There are no signs of injury.     Palpations: Abdomen is soft. There is no mass or pulsatile mass.     Tenderness: There is no abdominal tenderness. There is no guarding or rebound.     Hernia: There is no hernia in the umbilical area or ventral area.  Musculoskeletal:     Cervical back: Neck supple. No swelling, edema, deformity, erythema, signs of trauma, lacerations, rigidity, spasms, torticollis, tenderness, bony tenderness or crepitus. No pain with movement. Normal range of motion.     Thoracic back: No swelling, edema, deformity, signs of trauma, lacerations, spasms, tenderness or bony tenderness.     Lumbar back: No swelling, edema, deformity, signs of trauma, lacerations, spasms, tenderness or bony tenderness.     Right lower leg: No swelling, deformity, lacerations, tenderness or bony  tenderness. No edema.     Left lower leg: No swelling, deformity, lacerations, tenderness or bony tenderness. No edema.  Skin:    General: Skin is warm and dry.     Coloration: Skin is not jaundiced or pale.     Findings: No bruising.  Neurological:     General: No focal deficit present.     Mental Status: He is alert.     GCS: GCS eye subscore is 3. GCS verbal subscore is 5. GCS motor subscore is 6.     Comments: Patient able to follow commands, moves all limbs without any difficulty  Alert to person and place  Patient is somnolent but easily arousable to voice  Psychiatric:        Behavior: Behavior is cooperative.     ED Results / Procedures / Treatments   Labs (all labs ordered are listed, but only abnormal results are displayed) Labs Reviewed  ETHANOL -  Abnormal; Notable for the following components:      Result Value   Alcohol, Ethyl (B) 103 (*)    All other components within normal limits  CBC WITH DIFFERENTIAL/PLATELET - Abnormal; Notable for the following components:   WBC 3.5 (*)    RBC 4.01 (*)    Platelets 127 (*)    Lymphs Abs 0.4 (*)    All other components within normal limits  RAPID URINE DRUG SCREEN, HOSP PERFORMED - Abnormal; Notable for the following components:   Benzodiazepines POSITIVE (*)    All other components within normal limits  COMPREHENSIVE METABOLIC PANEL - Abnormal; Notable for the following components:   CO2 21 (*)    Calcium 8.8 (*)    AST 95 (*)    ALT 66 (*)    Anion gap 17 (*)    All other components within normal limits  CBG MONITORING, ED - Abnormal; Notable for the following components:   Glucose-Capillary 53 (*)    All other components within normal limits  CBG MONITORING, ED    EKG EKG Interpretation  Date/Time:  Sunday January 12 2021 13:37:11 EST Ventricular Rate:  75 PR Interval:    QRS Duration: 90 QT Interval:  433 QTC Calculation: 484 R Axis:   81 Text Interpretation: Sinus rhythm Probable LVH with secondary  repol abnrm Borderline prolonged QT interval Confirmed by Adam, Curatolo ([id number]) on 01/12/2021 1:40:32 PM   Radiology No results found.  Procedures Procedures   Medications Ordered in ED Medications  dextrose 50 % solution 50 mL (50 mLs Intravenous Given 01/12/21 1323)  thiamine (B-1) injection 100 mg (100 mg Intravenous Given 01/12/21 1405)  chlordiazePOXIDE (LIBRIUM) capsule 50 mg (50 mg Oral Given 01/12/21 1725)    ED Course  I have reviewed the triage vital signs and the nursing notes.  Pertinent labs & imaging results that were available during my care of the patient were reviewed by me and considered in my medical decision making (see chart for details).  Clinical Course as of 01/12/21 1433  Sun Jan 12, 2021  1230 Advised by nurse that CBG was 53; 1 amp of D50 ordered. [PB]    Clinical Course User Index [PB] ,  R, PA-C   MDM Rules/Calculators/A&P                          34  year old male somnolent but arousable to voice.  Found to be alert to person and place.   He denies any alcohol or drug use use since being from the hospital earlier this morning.  Patient reports that his "my legs quit working so I sat down."  Per triage note patient was found outside of from the center actively seizing upon EMS arrival.  Patient was given 5 mg Versed IM with resolution of seizures.  Clear patient somnolence is due to postictal state or obtunded from Versed.  Will order noncontrast head and neck CT, EKG, chest x-ray, and basic lab work, monitor patient.  Per chart review patient has been seen multiple times over the last 4 weeks for complaints of alcohol intoxication, alcohol withdrawal, and suicidal ideation.  Was recently patient was seen in [facility] emergency department yesterday from 1052 and discharged this morning for overnight observation.  Ethanol level yesterday was noted to be 418.    On physical exam patient is noted to have atraumatic head.  No spinous process  tenderness, midline tenderness, step-off, deformity noted to cervical, thoracic or  lumbar spine.  Noncontrast head CT unremarkable.  No evidence of acute injury to cervical spine.  Chest x-ray showed no acute cardiopulmonary disease.  POC CBG was 53.  Patient received amp of D50.  On repeat testing CBG was 99.  Patient will be given food and drink.  Ethanol noted to be 103; values obtained over the last 3 weeks show ethanol level 385 or higher.  1433 went to reevaluate patient he is noted to be alert and oriented however is having diffuse tremors throughout bilateral upper extremities.  CIWA protocol was initiated.  RN reported that this initial CIWA was 24 received 2 mg Ativan.  1540 RN reports that patient's CIWA is now 12 with improvement in his tremors.  1546 patient is alert and oriented able to stand and ambulate.  Patient still noted to have tremor in bilateral upper extremities however this is improved.  Will continue to monitor patient and administer dose of Librium.  Had frank conversation with patient about his goals concerning his alcohol use disorder.  Patient reports that he is interested in stopping his drinking.  Patient denies any suicidal ideations or homicidal ideations.  Patient will be given resources for outpatient substance use counseling.  Will provide him with Librium taper.    Patient tremor solved after receiving Librium.  Patient able to stand and ambulate.  Patient remains hemodynamically stable without any tachycardia.  Will discharge patient.  Discussed results, findings, treatment and follow up. Patient advised of return precautions. Patient verbalized understanding and agreed with plan.   Final Clinical Impression(s) / ED Diagnoses Final diagnoses:  Alcohol withdrawal syndrome without complication [facility])    Rx / DC Orders ED Discharge Orders         Ordered    chlordiazePOXIDE (LIBRIUM) 25 MG capsule        01/12/21 1922           Haskel Schroeder, PA-C 01/13/21 0006    Virgina Norfolk, DO 01/13/21 [phone]

## 2021-01-12 NOTE — Discharge Instructions (Addendum)
Follow-up with primary doctor.  Follow-up with outpatient alcohol treatment resources as so desired.  The resource guide has been provided in this discharge summary for you to call and make these arrangements.

## 2021-01-12 NOTE — ED Provider Notes (Signed)
Care assumed from Dr. Jeraldine Loots at shift change.  Patient presenting here intoxicated and agitated.  His blood alcohol was 418 upon presentation.  Patient has been observed overnight.  He has slept it off and is now feeling better.  Patient has been reassessed by myself.  He feels as though discharge is "in his best interest".  Patient to be discharged with as needed return.   [name], MD 01/12/21 [phone]

## 2021-01-12 NOTE — ED Notes (Signed)
Patient refused to change his clothes, EDP informed about it and approved not to do much with the patient and let him be calm and rest. Will continue to monitor.

## 2021-01-12 NOTE — Discharge Instructions (Addendum)
You came to the emergency department today to be evaluated for possible seizure and alcohol ingestion.  The CT scan of your head and neck no acute abnormalities.  Your lab work and physical exam were reassuring.    You were started on Librium to help with your alcohol withdrawal.  I have given you a prescription for a Librium taper to bridge you off of your drinking.  Please take this medication as prescribed.  Please do not drink with this medication.  I have given you a list of resources to contact for outpatient substance use and alcohol counseling.  Please follow-up with 1 of these resources as soon as possible.  Get help right away if: You have an irregular heartbeat. You have chest pain. You have trouble breathing. You have a seizure for the first time. You hallucinate. You become very confused.

## 2021-01-12 NOTE — ED Notes (Signed)
Patient transported to CT 

## 2021-01-12 NOTE — ED Triage Notes (Addendum)
[facility] arrival, given 5mg  Versed IM, seizing stopped.  Patient now somnolent but arousable to voice and oriented to person and place.   History of seizures when withdrawing from alcohol.  Glucose 78.

## 2021-01-12 NOTE — ED Notes (Signed)
Pt walked to lobby without assist and given water to drink

## 2021-01-15 ENCOUNTER — Encounter (HOSPITAL_COMMUNITY): Payer: Self-pay | Admitting: Emergency Medicine

## 2021-01-15 ENCOUNTER — Emergency Department (HOSPITAL_COMMUNITY): Payer: Self-pay

## 2021-01-15 ENCOUNTER — Emergency Department (HOSPITAL_COMMUNITY)
Admission: EM | Admit: 2021-01-15 | Discharge: 2021-01-16 | Disposition: A | Discharge status: Home / Self Care | Payer: Self-pay | Attending: Emergency Medicine | Admitting: Emergency Medicine

## 2021-01-15 DIAGNOSIS — Z87891 Personal history of nicotine dependence: Secondary | ICD-10-CM | POA: Insufficient documentation

## 2021-01-15 DIAGNOSIS — F10920 Alcohol use, unspecified with intoxication, uncomplicated: Secondary | ICD-10-CM

## 2021-01-15 DIAGNOSIS — Z21 Asymptomatic human immunodeficiency virus [HIV] infection status: Secondary | ICD-10-CM | POA: Insufficient documentation

## 2021-01-15 DIAGNOSIS — F10129 Alcohol abuse with intoxication, unspecified: Secondary | ICD-10-CM | POA: Insufficient documentation

## 2021-01-15 LAB — CBC
HCT: 43.6 % (ref 39.0–52.0)
Hemoglobin: 15 g/dL (ref 13.0–17.0)
MCH: 33.4 pg (ref 26.0–34.0)
MCHC: 34.4 g/dL (ref 30.0–36.0)
MCV: 97.1 fL (ref 80.0–100.0)
Platelets: 246 10*3/uL (ref 150–400)
RBC: 4.49 MIL/uL (ref 4.22–5.81)
RDW: 14.6 % (ref 11.5–15.5)
WBC: 4.4 10*3/uL (ref 4.0–10.5)
nRBC: 0 % (ref 0.0–0.2)

## 2021-01-15 LAB — COMPREHENSIVE METABOLIC PANEL
ALT: 55 U/L — ABNORMAL HIGH (ref 0–44)
AST: 71 U/L — ABNORMAL HIGH (ref 15–41)
Albumin: 4 g/dL (ref 3.5–5.0)
Alkaline Phosphatase: 47 U/L (ref 38–126)
Anion gap: 11 (ref 5–15)
BUN: <5 mg/dL — ABNORMAL LOW (ref 6–20)
CO2: 27 mmol/L (ref 22–32)
Calcium: 8.9 mg/dL (ref 8.9–10.3)
Chloride: 102 mmol/L (ref 98–111)
Creatinine, Ser: 0.69 mg/dL (ref 0.61–1.24)
GFR, Estimated: >60 mL/min (ref >60)
Glucose, Bld: 86 mg/dL (ref 70–99)
Potassium: 3.5 mmol/L (ref 3.5–5.1)
Sodium: 140 mmol/L (ref 135–145)
Total Bilirubin: 0.6 mg/dL (ref 0.3–1.2)
Total Protein: 6.8 g/dL (ref 6.5–8.1)

## 2021-01-15 LAB — ETHANOL: Alcohol, Ethyl (B): 418 mg/dL (ref <10)

## 2021-01-15 IMAGING — CT CT HEAD W/O CM
4 series · 17 of 47 positions shown, 19 images · non-contrast
Comparison: [DATE]

CLINICAL DATA: Headaches

EXAM:
CT HEAD WITHOUT CONTRAST
TECHNIQUE: Contiguous axial images were obtained from the base of the skull
through the vertex without intravenous contrast.

[Series 3: head bone · axial · 0.47mm/px · z∈[+1293,+1351]mm · 4 of 84 slices shown]
[im 9/84  bone]
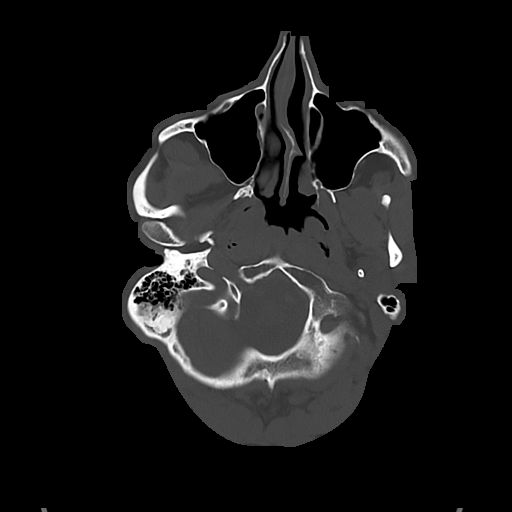
[im 17/84  bone]
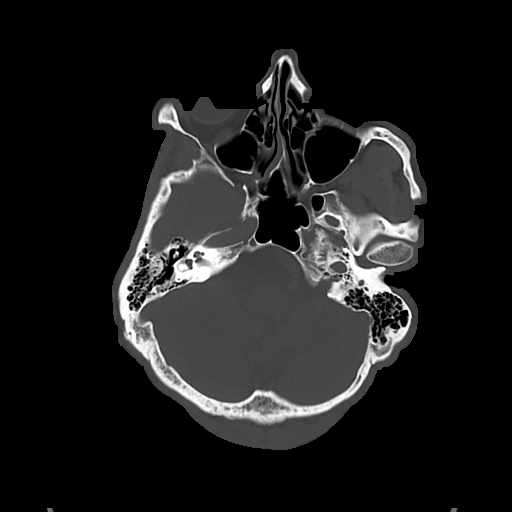
[im 25/84  bone]
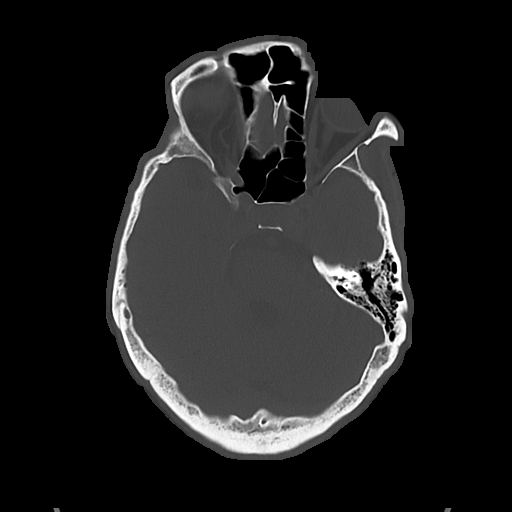
[im 38/84  bone]
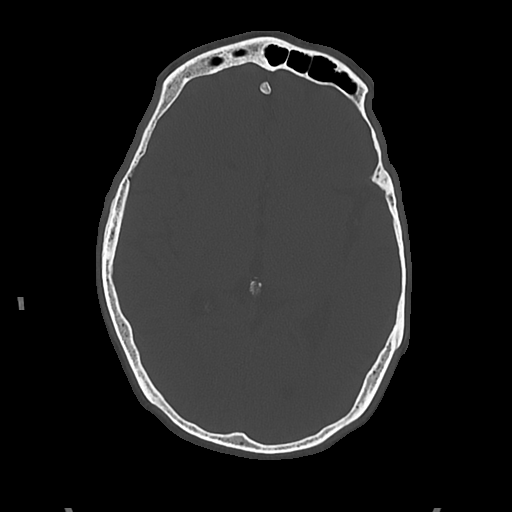

[Series 4: head wo · axial · 0.47mm/px · z∈[+1297,+1417]mm · 7 of 34 slices shown, 9 images]
[im 5/34  brain]
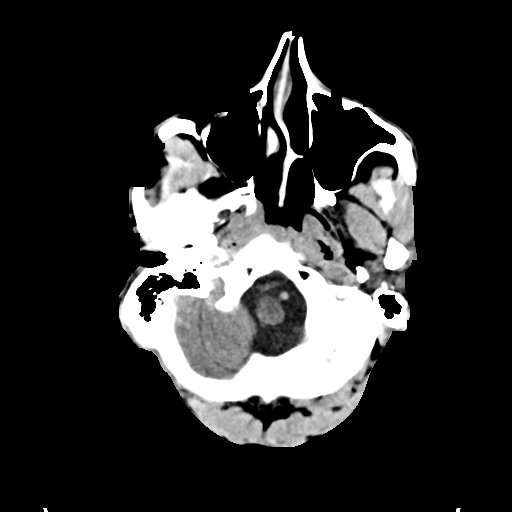
[im 5/34  bone]
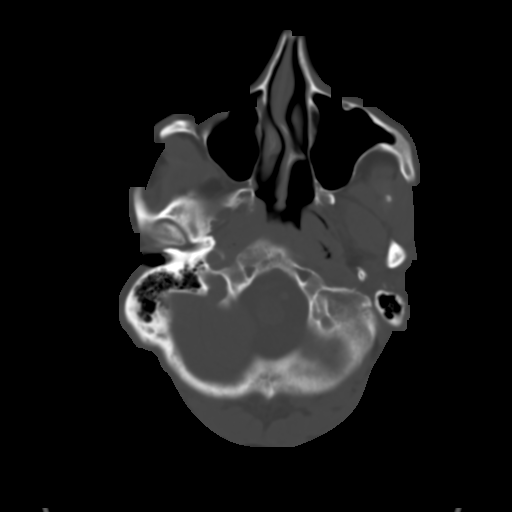
[im 9/34  brain]
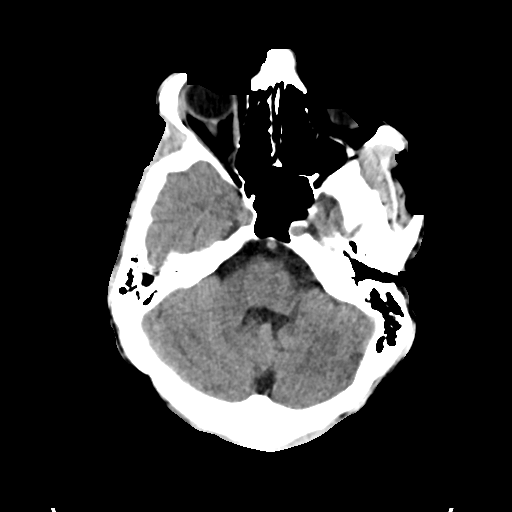
[im 13/34  brain]
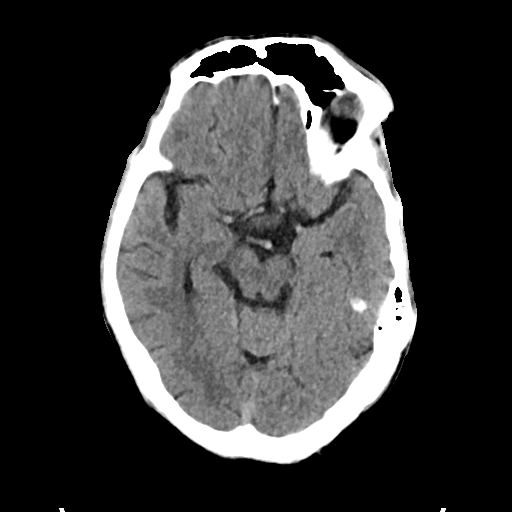
[im 17/34  brain]
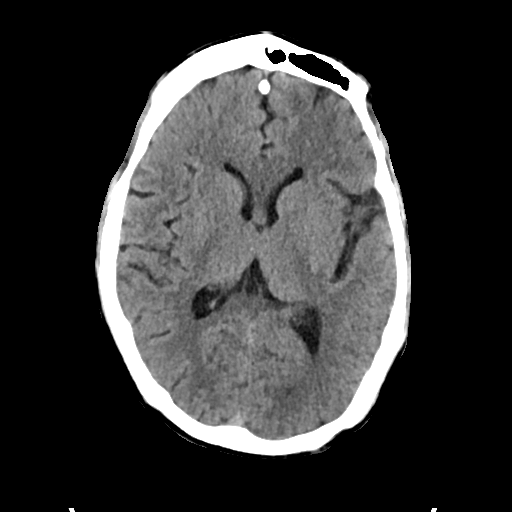
[im 21/34  brain]
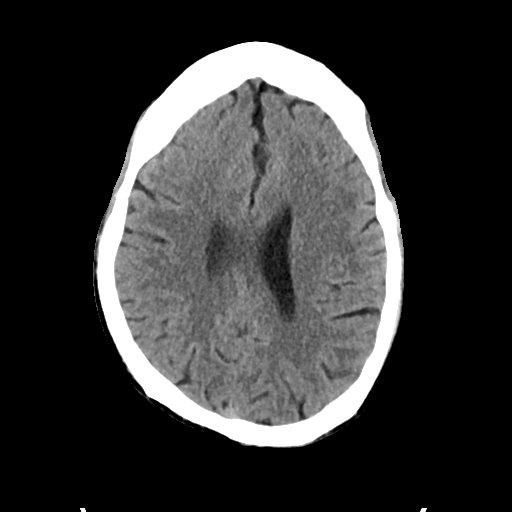
[im 21/34  bone]
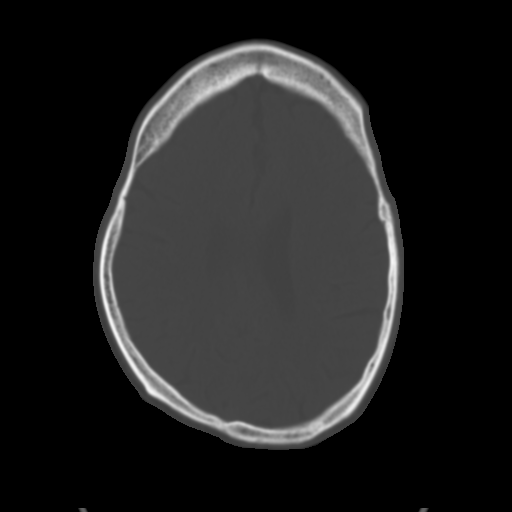
[im 25/34  brain]
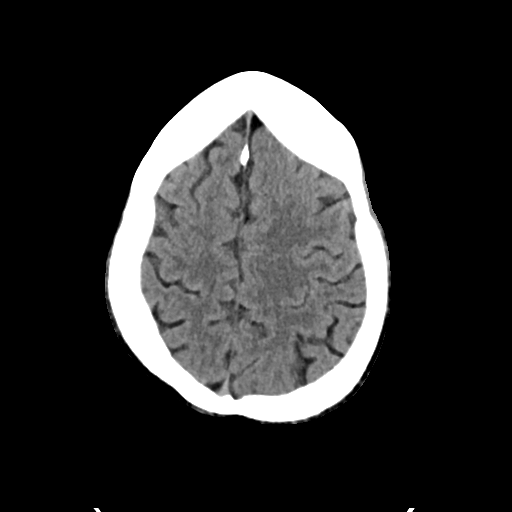
[im 29/34  brain]
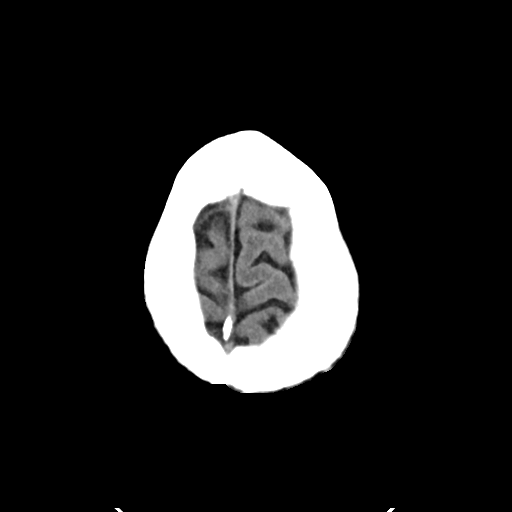

[Series 5: cor soft · coronal · 0.35mm/px · 3 of 76 slices shown]
[im 26/76  brain]
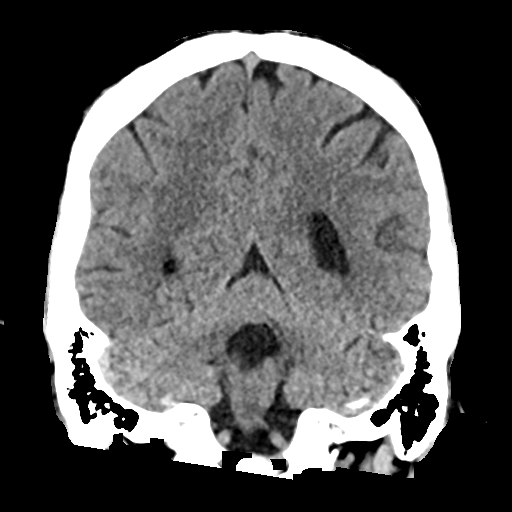
[im 34/76  brain]
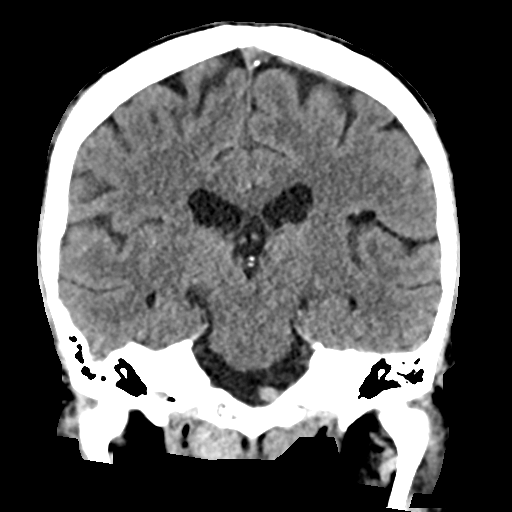
[im 42/76  brain]
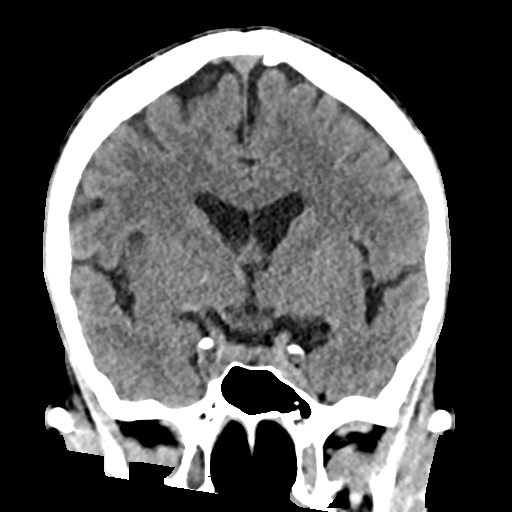

[Series 6: sag soft · sagittal · 0.36mm/px · 3 of 58 slices shown]
[im 22/58  brain]
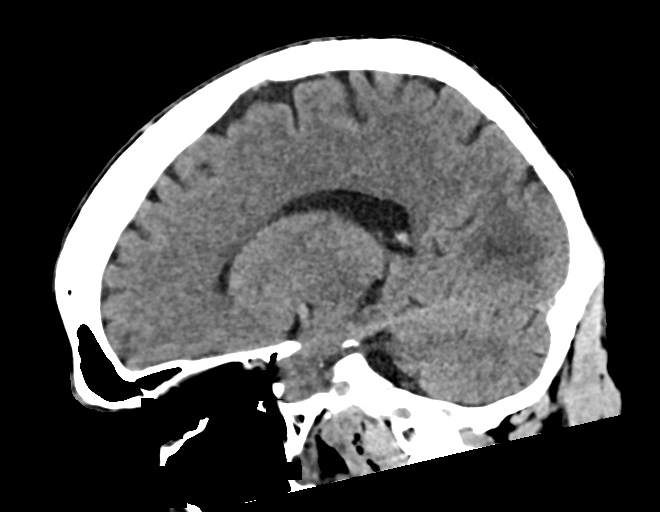
[im 29/58  brain]
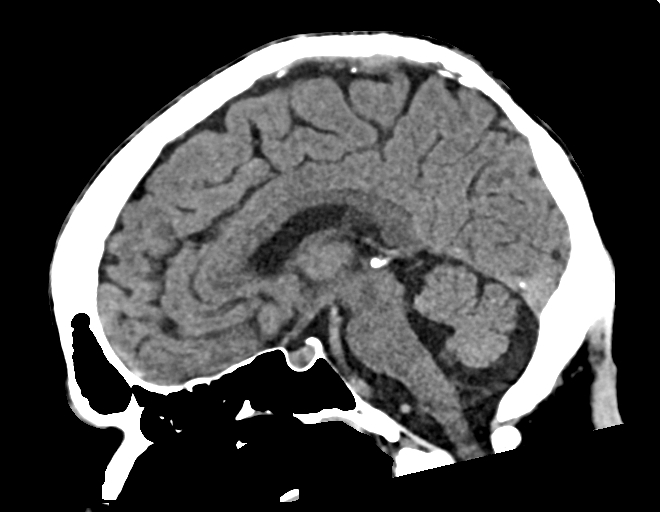
[im 36/58  brain]
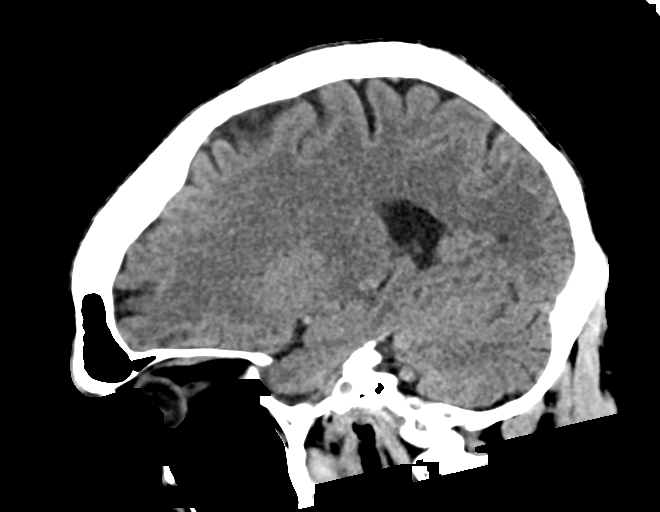

[17 of 47 positions shown; findings below may reference images not displayed]

FINDINGS: Brain: No evidence of acute infarction, hemorrhage, hydrocephalus,
extra-axial collection or mass lesion/mass effect.

Vascular: No hyperdense vessel or unexpected calcification.

Skull: Normal. Negative for fracture or focal lesion.

Sinuses/Orbits: No acute finding.

Other: None.
IMPRESSION: No acute intracranial abnormality noted. No significant change from
the recent exam.

## 2021-01-15 MED ORDER — SODIUM CHLORIDE 0.9 % IV BOLUS
1000.0000 mL | Freq: Once | INTRAVENOUS | Status: AC
  Administered 2021-01-15: 1000 mL via INTRAVENOUS

## 2021-01-15 MED ORDER — THIAMINE HCL 100 MG PO TABS
100.0000 mg | ORAL_TABLET | Freq: Every day | ORAL | Status: DC
  Administered 2021-01-15: 100 mg via ORAL
  Filled 2021-01-15: qty 1

## 2021-01-15 MED ORDER — LORAZEPAM 1 MG PO TABS
0.0000 mg | ORAL_TABLET | Freq: Four times a day (QID) | ORAL | Status: DC
  Administered 2021-01-15: 1 mg via ORAL
  Filled 2021-01-15: qty 1

## 2021-01-15 MED ORDER — THIAMINE HCL 100 MG/ML IJ SOLN: 100.0000 mg | Freq: Every day | INTRAMUSCULAR | Status: DC

## 2021-01-15 MED ORDER — LORAZEPAM 2 MG/ML IJ SOLN: 0.0000 mg | Freq: Four times a day (QID) | INTRAMUSCULAR | Status: DC

## 2021-01-15 MED ORDER — LORAZEPAM 2 MG/ML IJ SOLN: 0.0000 mg | Freq: Two times a day (BID) | INTRAMUSCULAR | Status: DC

## 2021-01-15 MED ORDER — CHLORDIAZEPOXIDE HCL 25 MG PO CAPS: ORAL_CAPSULE | ORAL | 0 refills | Status: DC

## 2021-01-15 MED ORDER — LORAZEPAM 1 MG PO TABS: 0.0000 mg | ORAL_TABLET | Freq: Two times a day (BID) | ORAL | Status: DC

## 2021-01-15 NOTE — Care Management (Signed)
Patient needs assistance with medication as per EDP, Paitent is medication assistance appropriate. Enrolled in [facility] program, assistance letter faxed to [facility] on [address] [phone] at patient's request.

## 2021-01-15 NOTE — ED Triage Notes (Addendum)
Patient BIB [facility] for alcohol intoxication, states he his trying detox starting today, last drink this morning. Vitals within normal limits. Patient alert, oriented, and in no apparent distress at this time.

## 2021-01-15 NOTE — Care Management (Signed)
  [facility] Medication Assistance Card Name: Lantz Hermann ID (MRN): [id number] Bin: [id number] RX Group: [id number] Discharge Date: 01/16/2019 Expiration Date:01/25/2021                                           (must be filled within 7 days of discharge)     Dear   : Gary [name]  You have been approved to have the prescriptions written by your discharging physician filled through our [facility] (Medication Assistance Through [facility]) program. This program allows for a one-time (no refills) 34-day supply of selected medications for a low copay amount.  The copay is $3.00 per prescription. For instance, if you have one prescription, you will pay $3.00; for two prescriptions, you pay $6.00; for three prescriptions, you pay $9.00; and so on.  Only certain pharmacies are participating in this program with [facility]. You will need to select one of the pharmacies from the attached list and take your prescriptions, this letter, and your photo ID to one of the participating pharmacies.   We are excited that you are able to use the [facility] program to get your medications. These prescriptions must be filled within 7 days of hospital discharge or they will no longer be valid for the [facility] program. Should you have any problems with your prescriptions please contact your case management team member at [phone] for [place].  Thank you, [facility] Health Care Management

## 2021-01-15 NOTE — ED Provider Notes (Signed)
[facility] Provider [facility]   CSN: [id number] Arrival date & time: 01/15/21  1231    History Chief Complaint  Patient presents with  . Alcohol Intoxication    Gary [name] is a 35 y.o. male with past medical history significant for HIV, EtOH use, amphetamine use who presents for evaluation of EtOH intoxication.  According to triage patient states he wants to detox.  He is intermittently compliant with exam. Denies any recent trauma or injury.  Refuses to answer SI, HI, AVH.  States the last drink he had was prior to arrival.  Refuses to participate in exam  Per chart review patient has been seen multiple times in the past for EtOH intoxication as well as withdrawal seizures.  Most recently 3 days ago, here with likely EtOH withdrawal seizure with EtOH levels at 100.  Given Librium   Level 5 Caveat- Alcohol intoxication  HPI     Past Medical History:  Diagnosis Date  . Alcohol abuse   . HIV (human immunodeficiency virus infection) (HCC)   . Subdural hematoma [facility](HCC)     Patient Active Problem List   Diagnosis Date Noted  . 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 Elevated troponin 10/19/2020  . Alcohol withdrawal (HCC) 10/19/2020  . Alcohol abuse   . Suicidal ideation 06/14/2020  . Pancreatitis 06/13/2020  . 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  . Malnutrition of moderate degree 05/07/2020  . Gastrointestinal hemorrhage   . Alcoholic hepatitis without ascites   . Melena 05/05/2020  . Alcoholic ketoacidosis 05/05/2020  . Thrombocytopenia (HCC) 07/25/2019  . Transaminitis 07/25/2019  . Traumatic subdural hematoma (HCC) 07/02/2019  . Alcohol abuse with intoxication (HCC) 07/02/2019  . Alcohol abuse with alcohol-induced mood disorder (HCC) 05/13/2019  . Alcohol dependence (HCC) 04/16/2019  . 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 HIV disease (HCC) 06/16/2018  .  Polysubstance abuse (HCC) 06/16/2018  . Cigarette smoker 06/16/2018    Past Surgical History:  Procedure Laterality Date  . INCISION AND DRAINAGE PERIRECTAL ABSCESS N/A 09/24/2016   Procedure: IRRIGATION AND DEBRIDEMENT PERIRECTAL ABSCESS;  Surgeon: Ricarda Frameharles Woodham, MD;  Location: [facility] ORS;  Service: General;  Laterality: N/A;  . none         Family History  Problem Relation Age of Onset  . Alcohol abuse Mother   . Alcohol abuse Father     Social History   Tobacco Use  . Smoking status: Former Smoker    Packs/day: 0.50    Types: Cigarettes  . Smokeless tobacco: Never Used  . Tobacco comment: unable to smoke while incarcerated 6+ months 02/13/20  Vaping Use  . Vaping Use: Never used  Substance Use Topics  . Alcohol use: Yes    Comment: "I drink all I can"  . Drug use: Not Currently    Types: Marijuana, Methamphetamines    Comment: last used 04/10/2019    Home Medications Prior to Admission medications   Medication Sig Start Date End Date Taking? Authorizing Provider  chlordiazePOXIDE (LIBRIUM) 25 MG capsule 50mg  PO TID x 1D, then 25-50mg  PO BID X 1D, then 25-50mg  PO QD X 1D 01/15/21  Yes Henderly, Britni A, PA-C  elvitegravir-cobicistat-emtricitabine-tenofovir (GENVOYA) 150-150-200-10 MG TABS tablet Take 1 tablet by mouth daily with breakfast. Patient not taking: No sig reported 09/28/20   Clapacs, Jackquline DenmarkJohn T, MD  famotidine (PEPCID) 20 MG tablet Take 1 tablet (20 mg total) by mouth 2 (two) times daily. Patient not taking: Reported  on 06/01/2019 05/14/19 06/02/19  Benjiman Core, MD    Allergies    Tegretol [carbamazepine] and Caffeine  Review of Systems   Review of Systems  Unable to perform ROS: Other (Alcohol intoxication)  All other systems reviewed and are negative.   Physical Exam Updated Vital Signs BP 113/70   Pulse 93   Temp 97.9 F (36.6 C)   Resp 12   SpO2 100%   Physical Exam Vitals and nursing note reviewed.  Constitutional:      General: He is not  in acute distress.    Appearance: He is well-developed and well-nourished. He is not ill-appearing, toxic-appearing or diaphoretic.  HENT:     Head: Normocephalic and atraumatic.     Comments: No traumatic injuries.    Nose: Nose normal.     Mouth/Throat:     Mouth: Mucous membranes are moist.     Comments: Posterior oropharynx clear.  Tongue midline.  Smile symmetric Eyes:     Pupils: Pupils are equal, round, and reactive to light.     Comments: Equal reactive to light.  Cardiovascular:     Rate and Rhythm: Normal rate and regular rhythm.     Pulses: Normal pulses.     Heart sounds: Normal heart sounds.  Pulmonary:     Effort: Pulmonary effort is normal. No respiratory distress.     Breath sounds: Normal breath sounds.  Abdominal:     General: Bowel sounds are normal. There is no distension.     Palpations: Abdomen is soft.     Tenderness: There is no abdominal tenderness. There is no right CVA tenderness, left CVA tenderness or guarding.  Musculoskeletal:        General: Normal range of motion.     Cervical back: Normal range of motion and neck supple.     Comments: Moves all 4 extremities.  No bony tenderness.  Very minimal intentional tremor to bilateral hands  Skin:    General: Skin is warm and dry.     Capillary Refill: Capillary refill takes less than 2 seconds.  Neurological:     Mental Status: He is alert.     Comments: Asleep however arousable to voice.  Intermittently answers questions without any stutter or phonation changes.  He is able to lift all 4 extremities off the bed without difficulty.  His pupils are equal and reactive to light.  His smile is symmetric.  He has equal handgrip bilaterally however does have some very minimal intentional tremors bilaterally.  Psychiatric:        Mood and Affect: Mood and affect normal.     Comments: Refuses to answer SI, HI, AVH questions     ED Results / Procedures / Treatments   Labs (all labs ordered are listed, but only  abnormal results are displayed) Labs Reviewed  COMPREHENSIVE METABOLIC PANEL - Abnormal; Notable for the following components:      Result Value   BUN <5 (*)    AST 71 (*)    ALT 55 (*)    All other components within normal limits  ETHANOL - Abnormal; Notable for the following components:   Alcohol, Ethyl (B) 418 (*)    All other components within normal limits  CBC  RAPID URINE DRUG SCREEN, HOSP PERFORMED    EKG None  Radiology CT Head Wo Contrast  Result Date: 01/15/2021 CLINICAL DATA:  Headaches EXAM: CT HEAD WITHOUT CONTRAST TECHNIQUE: Contiguous axial images were obtained from the base of the skull through  the vertex without intravenous contrast. COMPARISON:  01/12/2021 FINDINGS: Brain: No evidence of acute infarction, hemorrhage, hydrocephalus, extra-axial collection or mass lesion/mass effect. Vascular: No hyperdense vessel or unexpected calcification. Skull: Normal. Negative for fracture or focal lesion. Sinuses/Orbits: No acute finding. Other: None. IMPRESSION: No acute intracranial abnormality noted. No significant change from the recent exam. Electronically Signed   By: Alcide Clever M.D.   On: 01/15/2021 21:14    Procedures Procedures   Medications Ordered in ED Medications  LORazepam (ATIVAN) injection 0-4 mg ( Intravenous See Alternative 01/15/21 1937)    Or  LORazepam (ATIVAN) tablet 0-4 mg (1 mg Oral Given 01/15/21 1937)  LORazepam (ATIVAN) injection 0-4 mg (has no administration in time range)    Or  LORazepam (ATIVAN) tablet 0-4 mg (has no administration in time range)  thiamine tablet 100 mg (100 mg Oral Given 01/15/21 1937)    Or  thiamine (B-1) injection 100 mg ( Intravenous See Alternative 01/15/21 1937)  sodium chloride 0.9 % bolus 1,000 mL (0 mLs Intravenous Stopped 01/15/21 2202)    ED Course  I have reviewed the triage vital signs and the nursing notes.  Pertinent labs & imaging results that were available during my care of the patient were reviewed by me and  considered in my medical decision making (see chart for details).  35 year old here with EtOH intoxication.  Per triage note he wanted to detox.  Patient not participate in most of exam.  He will intermittently answer questions.  Does deny any pain, traumatic injuries.  Refuses to answer SI, HI, AVH.  States he would like to sleep.  Labs do not show any significant abnormality aside from an elevated EtOH level greater than 400.  He does not have any traumatic injuries on exam.  Will allow patient to sober and reassess  Clinical Course as of 01/15/21 2326  Wed Jan 15, 2021  1811 Patient reassessed.  Sleeping soundly.  No acute distress.  No tachycardia, hypertension [BH]    Clinical Course User Index [BH] Henderly, Britni A, PA-C   Patient reassessed.  Apparently had CIWA score of 7 with nursing.  Was given 1 mg Ativan.  Patient states he has a headache.  States he thinks he might have fallen.  Imaging evidence of traumatic injuries on exam however given intoxication will obtain CT head to r/o possible injury.  Patient reassessed.  Discussed labs and imaging.  He would like to go home however he is requesting prescription to help him detox.  Previous provider had sent in Librium prescription 3 days ago.  States he was not able to afford this.  I have consulted with transition of care.  We will attempt to get medication waiver.  Patient is agreeable to this.  He is without any tachycardia, tachypnea or hypoxia.  He has no hypertension.  He has a nonfocal exam without deficits.  He is requesting p.o. intake.  Initial CIWA score 7, repeat 4.  Patient reassessed. Nursing went to ambulate and patient was wobbly on his feet. Will allow to continue to sober. Will need close monitoring given hx of WD.  Care transferred to [facility], PA-C who will follow up on reassessment.  MDM Rules/Calculators/A&P                           Final Clinical Impression(s) / ED Diagnoses Final diagnoses:  Alcoholic  intoxication without complication (HCC)    Rx / DC Orders ED Discharge Orders  Ordered    chlordiazePOXIDE (LIBRIUM) 25 MG capsule        01/15/21 2131           Henderly, Britni A, PA-C 01/15/21 2326    Tilden Fossa, MD 01/16/21 1438

## 2021-01-16 NOTE — Discharge Instructions (Addendum)
You were seen in the emergency department today for alcohol intoxication.  Please follow-up with the outpatient resources provided.  You may pick up a Librium taper to take to try to stop drinking-do not drink alcohol and take Librium at the same time as this can be very dangerous.  We have prescribed you new medication(s) today. Discuss the medications prescribed today with your pharmacist as they can have adverse effects and interactions with your other medicines including over the counter and prescribed medications. Seek medical evaluation if you start to experience new or abnormal symptoms after taking one of these medicines, seek care immediately if you start to experience difficulty breathing, feeling of your throat closing, facial swelling, or rash as these could be indications of a more serious allergic reaction  Please return to the ER for any new or worsening symptoms including but not limited to seizure activity, hallucinations, inability to keep fluids down, fever, chest pain, trouble breathing, or any other concerns.

## 2021-01-16 NOTE — ED Provider Notes (Signed)
23:30: Assumed care of patient from PA Henderly @ change of shift pending metabolization & likely discharge home.   Please see prior provider note for full H&P.  Briefly patient is a 35 yo male with a hx of ETOH abuse, HIV, and amphetamine use who presented to the ED with alcohol intoxication. He reported to want detox. Last drink was just PTA.   Work up notable for alcohol intoxication with ethanol level of 418.   Patient with initial CIWA 7--> repeat 4.  Had some unsteadiness with ambulation around 22:30, plan is to allow to metabolize, monitor, and re-ambulate in a couple of hours.   00:58 Patient ambulatory to the restroom without significant unsteadiness, able to ambulate unassisted, he is tolerating PO and appears appropriate for discharge.   Prior PA has sent in librium taper for the patient.   Following discussion for discharge and subsequent providing of discharge paperwork patient became angry and stated he did not want to leave, he was told he was being discharged after thorough observation and subsequent made comments about drinking himself to death, he had not made any specific statements of being suicidal until time of discharge when he did not want to leave. We advised against excessive drinking especially to unsafe amounts, discussed detox resources, and utilizing librium taper. Advised not drinking EtOH when taking librium. He has been present in the ED for > 12 hours, observed for extended period of time, is ambulatory, and tolerating PO- appears appropriate for discharge home. We offered him a wheelchair upon discharge- he declined this and ambulated out of department in no acute distress.   Findings and plan of care discussed with supervising physician Dr. Clayborne Dana who has discussed w/ patient & is in agreement.        Gary [name] 01/16/21 0308    Marily Memos, MD 01/16/21 [phone]

## 2021-01-18 ENCOUNTER — Encounter (HOSPITAL_COMMUNITY): Payer: Self-pay

## 2021-01-18 ENCOUNTER — Emergency Department (HOSPITAL_COMMUNITY)
Admission: EM | Admit: 2021-01-18 | Discharge: 2021-01-19 | Disposition: A | Discharge status: Home / Self Care | Payer: Self-pay | Attending: Emergency Medicine | Admitting: Emergency Medicine

## 2021-01-18 ENCOUNTER — Emergency Department (HOSPITAL_COMMUNITY): Payer: Self-pay

## 2021-01-18 ENCOUNTER — Other Ambulatory Visit: Payer: Self-pay

## 2021-01-18 DIAGNOSIS — Z21 Asymptomatic human immunodeficiency virus [HIV] infection status: Secondary | ICD-10-CM | POA: Insufficient documentation

## 2021-01-18 DIAGNOSIS — S0083XA Contusion of other part of head, initial encounter: Secondary | ICD-10-CM | POA: Insufficient documentation

## 2021-01-18 DIAGNOSIS — X58XXXA Exposure to other specified factors, initial encounter: Secondary | ICD-10-CM | POA: Insufficient documentation

## 2021-01-18 DIAGNOSIS — T5191XA Toxic effect of unspecified alcohol, accidental (unintentional), initial encounter: Secondary | ICD-10-CM | POA: Insufficient documentation

## 2021-01-18 DIAGNOSIS — Z87891 Personal history of nicotine dependence: Secondary | ICD-10-CM | POA: Insufficient documentation

## 2021-01-18 DIAGNOSIS — S0091XA Abrasion of unspecified part of head, initial encounter: Secondary | ICD-10-CM | POA: Insufficient documentation

## 2021-01-18 LAB — RAPID URINE DRUG SCREEN, HOSP PERFORMED
Amphetamines: NOT DETECTED
Barbiturates: NOT DETECTED
Benzodiazepines: POSITIVE — AB
Cocaine: NOT DETECTED
Opiates: NOT DETECTED
Tetrahydrocannabinol: NOT DETECTED

## 2021-01-18 LAB — CBC
HCT: 41.7 % (ref 39.0–52.0)
Hemoglobin: 14.2 g/dL (ref 13.0–17.0)
MCH: 33.3 pg (ref 26.0–34.0)
MCHC: 34.1 g/dL (ref 30.0–36.0)
MCV: 97.9 fL (ref 80.0–100.0)
Platelets: 218 10*3/uL (ref 150–400)
RBC: 4.26 MIL/uL (ref 4.22–5.81)
RDW: 15.3 % (ref 11.5–15.5)
WBC: 4.4 10*3/uL (ref 4.0–10.5)
nRBC: 0 % (ref 0.0–0.2)

## 2021-01-18 LAB — COMPREHENSIVE METABOLIC PANEL
ALT: 49 U/L — ABNORMAL HIGH (ref 0–44)
AST: 72 U/L — ABNORMAL HIGH (ref 15–41)
Albumin: 3.9 g/dL (ref 3.5–5.0)
Alkaline Phosphatase: 44 U/L (ref 38–126)
Anion gap: 10 (ref 5–15)
BUN: 7 mg/dL (ref 6–20)
CO2: 26 mmol/L (ref 22–32)
Calcium: 8.3 mg/dL — ABNORMAL LOW (ref 8.9–10.3)
Chloride: 105 mmol/L (ref 98–111)
Creatinine, Ser: 0.79 mg/dL (ref 0.61–1.24)
GFR, Estimated: >60 mL/min (ref >60)
Glucose, Bld: 95 mg/dL (ref 70–99)
Potassium: 3.7 mmol/L (ref 3.5–5.1)
Sodium: 141 mmol/L (ref 135–145)
Total Bilirubin: 0.6 mg/dL (ref 0.3–1.2)
Total Protein: 6.9 g/dL (ref 6.5–8.1)

## 2021-01-18 LAB — ETHANOL: Alcohol, Ethyl (B): 488 mg/dL (ref <10)

## 2021-01-18 IMAGING — CT CT HEAD W/O CM
4 series · 17 of 47 positions shown, 19 images · non-contrast
Comparison: [DATE]

CLINICAL DATA: Head trauma

EXAM:
CT HEAD WITHOUT CONTRAST
TECHNIQUE: Contiguous axial images were obtained from the base of the skull
through the vertex without intravenous contrast.

[Series 3: head bone · axial · 0.47mm/px · z∈[-141,-55]mm · 5 of 91 slices shown]
[im 9/91  bone]
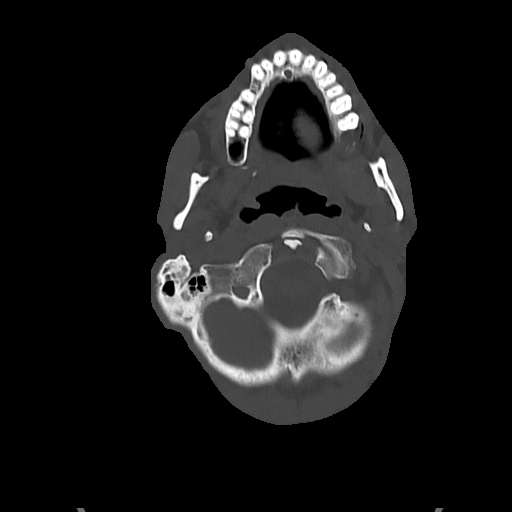
[im 18/91  bone]
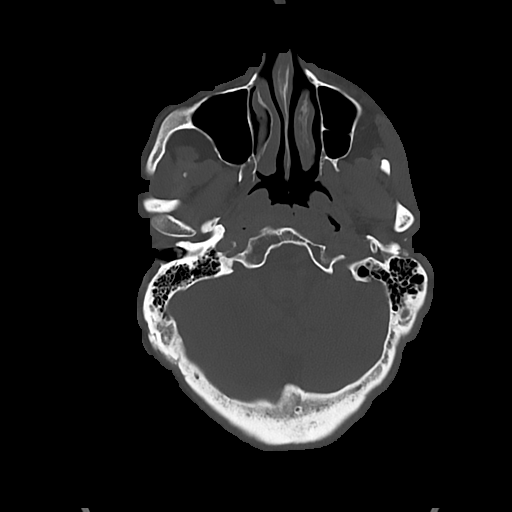
[im 31/91  bone]
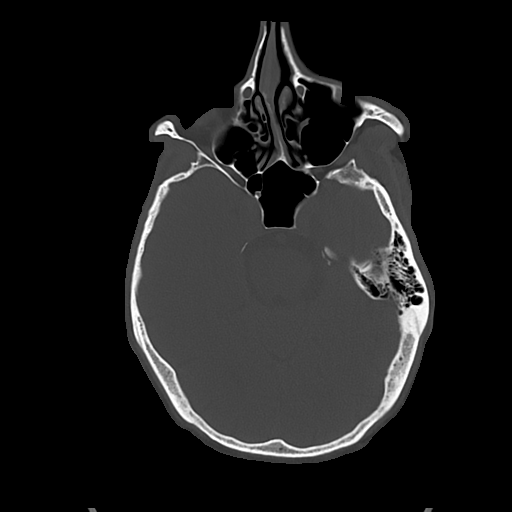
[im 39/91  bone]
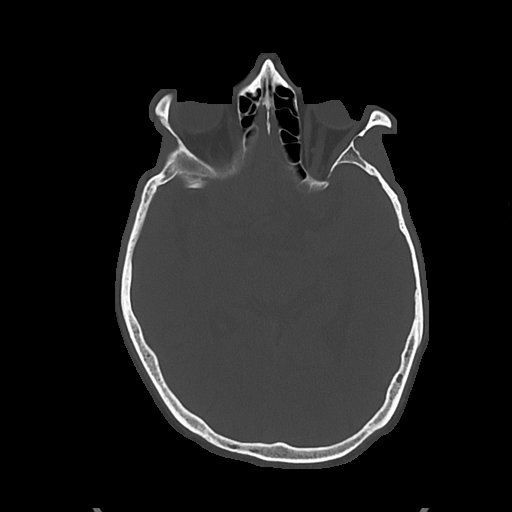
[im 52/91  bone]
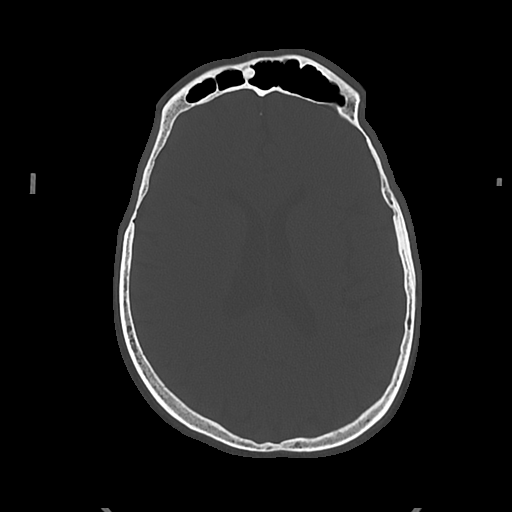

[Series 4: head wo · axial · 0.47mm/px · z∈[-137,-12]mm · 6 of 35 slices shown, 8 images]
[im 5/35  brain]
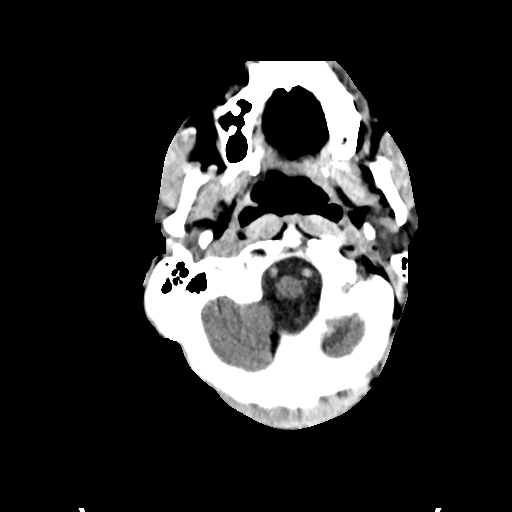
[im 5/35  bone]
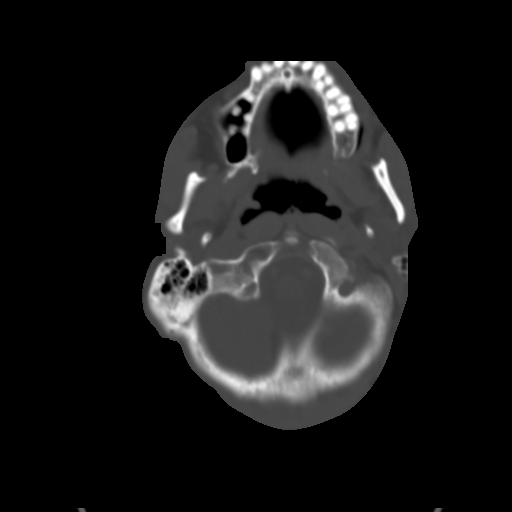
[im 10/35  brain]
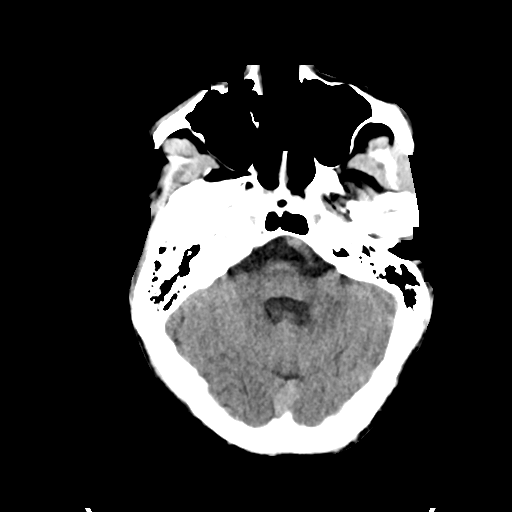
[im 15/35  brain]
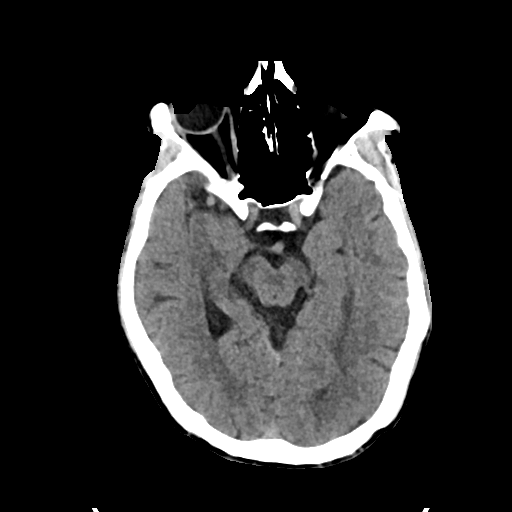
[im 20/35  brain]
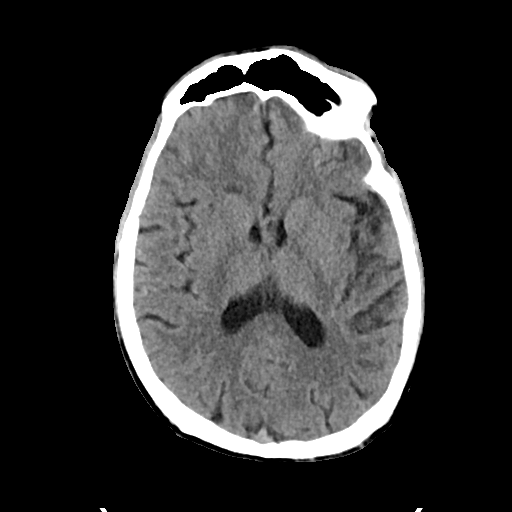
[im 25/35  brain]
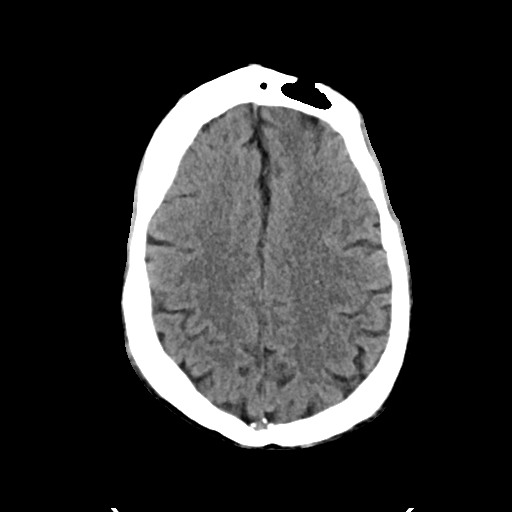
[im 25/35  bone]
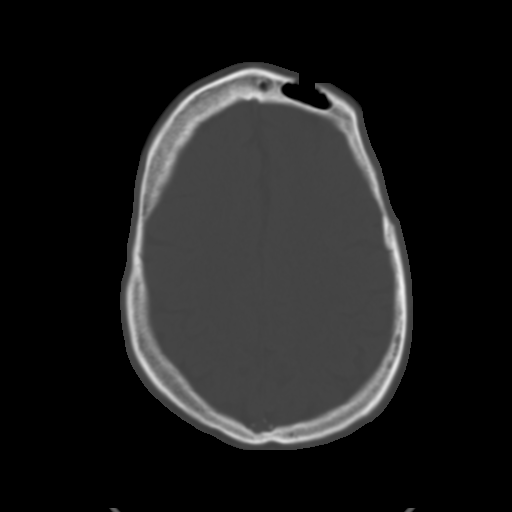
[im 30/35  brain]
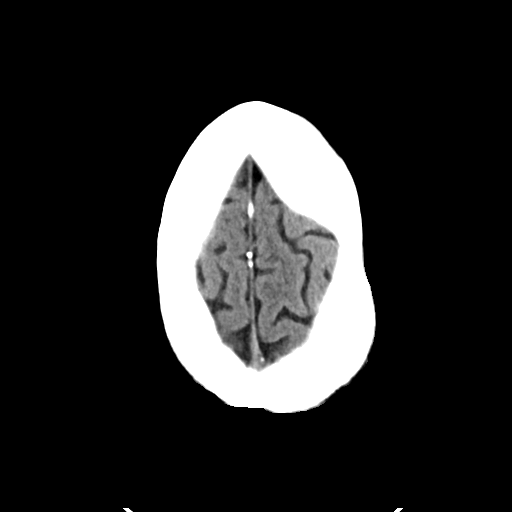

[Series 5: cor soft · coronal · 0.36mm/px · 3 of 80 slices shown]
[im 27/80  brain]
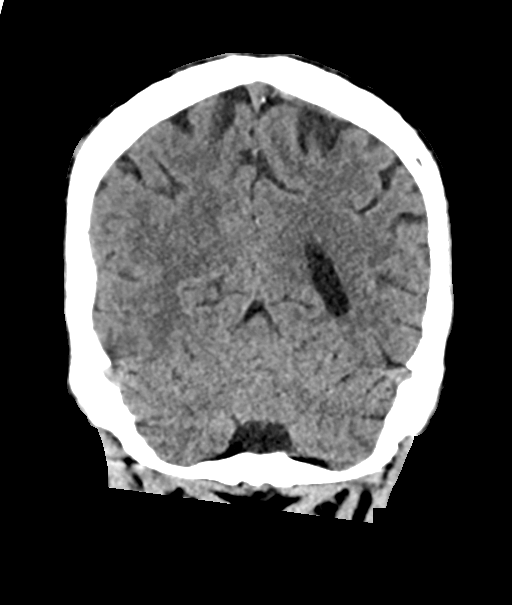
[im 36/80  brain]
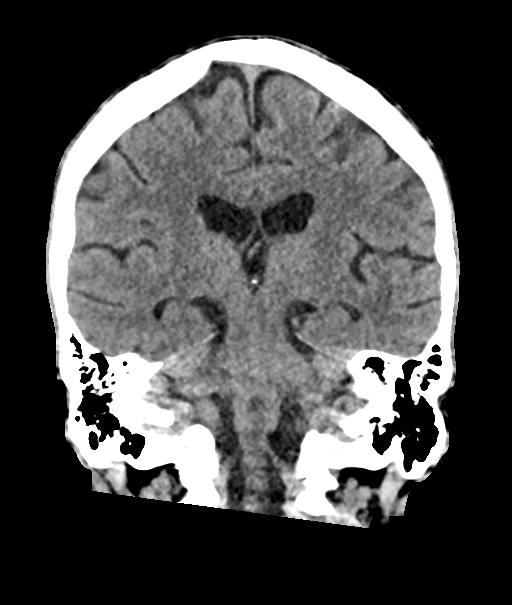
[im 44/80  brain]
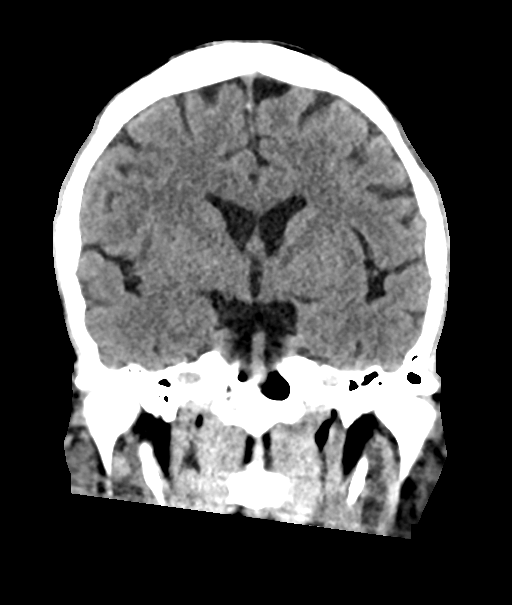

[Series 6: sag soft · sagittal · 0.41mm/px · 3 of 54 slices shown]
[im 20/54  brain]
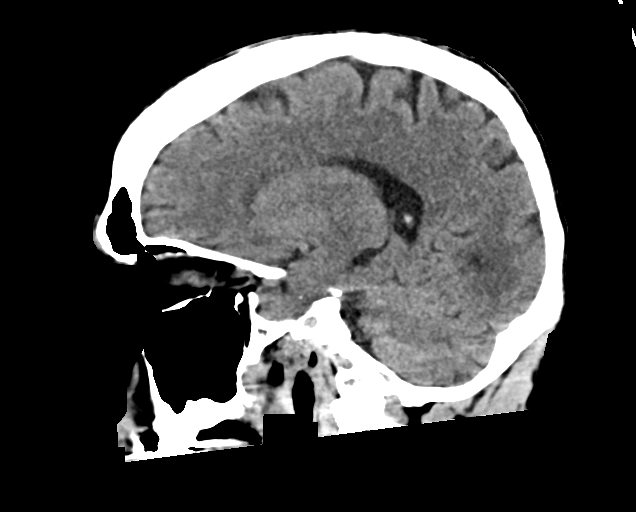
[im 28/54  brain]
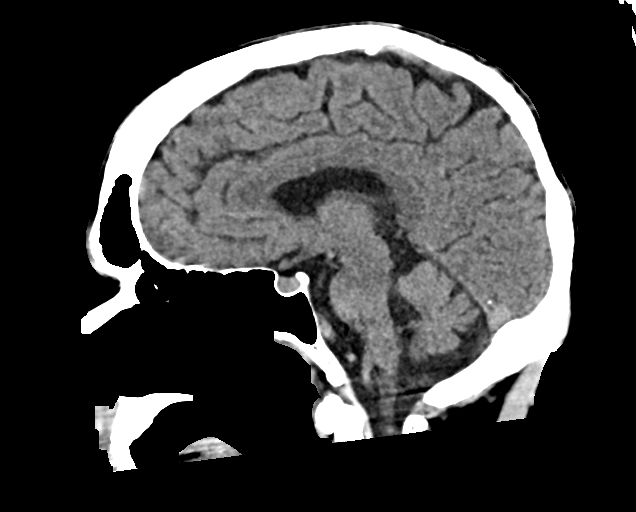
[im 36/54  brain]
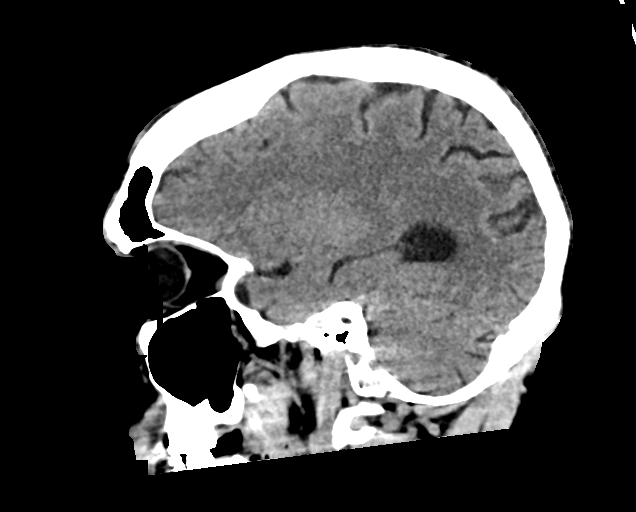

[17 of 47 positions shown; findings below may reference images not displayed]

FINDINGS: Brain: No evidence of acute infarction, hemorrhage, hydrocephalus,
extra-axial collection or mass lesion/mass effect.

Vascular: No hyperdense vessel or unexpected calcification.

Skull: Negative for acute calvarial fracture.

Sinuses/Orbits: No acute finding.

Other: Negative for scalp hematoma.
IMPRESSION: No acute intracranial findings.

## 2021-01-18 MED ORDER — THIAMINE HCL 100 MG/ML IJ SOLN
100.0000 mg | Freq: Once | INTRAMUSCULAR | Status: AC
  Administered 2021-01-18: 100 mg via INTRAVENOUS
  Filled 2021-01-18: qty 2

## 2021-01-18 MED ORDER — LACTATED RINGERS IV BOLUS
1000.0000 mL | Freq: Once | INTRAVENOUS | Status: AC
  Administered 2021-01-18: 1000 mL via INTRAVENOUS

## 2021-01-18 MED ORDER — SODIUM CHLORIDE 0.9 % IV BOLUS
1000.0000 mL | Freq: Once | INTRAVENOUS | Status: AC
  Administered 2021-01-18: 1000 mL via INTRAVENOUS

## 2021-01-18 MED ORDER — LACTATED RINGERS IV SOLN: INTRAVENOUS | Status: DC

## 2021-01-18 NOTE — ED Notes (Signed)
ETOH 488 -Critical value reported to RN and provider

## 2021-01-18 NOTE — ED Triage Notes (Signed)
Patient arrived by [facility] after being found by [employer] on side of road. Patient has hx of ETOH abuse and small abrasion noted to hands and head. Patient will arouse to command, patient states that his entire body hurts. Patient extremities cool to touch since being outside unknown time

## 2021-01-18 NOTE — ED Provider Notes (Signed)
Patient taken in sign out from Dr. Rhunette Croft. Patient here with ETOH intoxication He has had soft pressures.patient is more alert. Receiving fluids. Patient has eaten. I will continue to monitor the patient closely. . Vitals:   01/18/21 2207 01/18/21 2215 01/18/21 2234 01/18/21 2239  BP: (!) 95/58 104/70  96/82  Pulse: 91 75  93  Resp: 14 14    Temp:   97.8 F (36.6 C)   TempSrc:   Oral   SpO2: 98% 100%     12:25 AM BP 102/63   Pulse 68   Temp 97.8 F (36.6 C) (Oral)   Resp 13   SpO2 96%  Patient BP improved. He is still clinically intoxicated. I have given sign out to PA Paragon who will assume care.   Arthor Captain, PA-C 01/19/21 [phone]    Derwood Kaplan, MD 01/19/21 [phone]

## 2021-01-18 NOTE — ED Provider Notes (Signed)
[facility] EMERGENCY DEPARTMENT Provider Note   CSN: [id number] Arrival date & time: 01/18/21  1251     History No chief complaint on file.   Gary [name] is a 35 y.o. male.  HPI    35 year old male comes in a chief complaint of likely intoxication. Patient has history of HIV, subdural hematoma, alcohol abuse.  He was found on the streets, passed out with an abrasion over his head. Patient is noted to be somnolent and asleep when I assessed him.  He does not answer any of my questions. Past Medical History:  Diagnosis Date  . Alcohol abuse   . HIV (human immunodeficiency virus infection) (HCC)   . Subdural hematoma [facility])     Patient Active Problem List   Diagnosis Date Noted  . 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 Elevated troponin 10/19/2020  . Alcohol withdrawal (HCC) 10/19/2020  . Alcohol abuse   . Suicidal ideation 06/14/2020  . Pancreatitis 06/13/2020  . 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  . Malnutrition of moderate degree 05/07/2020  . Gastrointestinal hemorrhage   . Alcoholic hepatitis without ascites   . Melena 05/05/2020  . Alcoholic ketoacidosis 05/05/2020  . Thrombocytopenia (HCC) 07/25/2019  . Transaminitis 07/25/2019  . Traumatic subdural hematoma (HCC) 07/02/2019  . Alcohol abuse with intoxication (HCC) 07/02/2019  . Alcohol abuse with alcohol-induced mood disorder (HCC) 05/13/2019  . Alcohol dependence (HCC) 04/16/2019  . 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 HIV disease (HCC) 06/16/2018  . Polysubstance abuse (HCC) 06/16/2018  . Cigarette smoker 06/16/2018    Past Surgical History:  Procedure Laterality Date  . INCISION AND DRAINAGE PERIRECTAL ABSCESS N/A 09/24/2016   Procedure: IRRIGATION AND DEBRIDEMENT PERIRECTAL ABSCESS;  Surgeon: Ricarda Frame, MD;  Location: [facility] ORS;  Service: General;  Laterality: N/A;  . none         Family History   Problem Relation Age of Onset  . Alcohol abuse Mother   . Alcohol abuse Father     Social History   Tobacco Use  . Smoking status: Former Smoker    Packs/day: 0.50    Types: Cigarettes  . Smokeless tobacco: Never Used  . Tobacco comment: unable to smoke while incarcerated 6+ months 02/13/20  Vaping Use  . Vaping Use: Never used  Substance Use Topics  . Alcohol use: Yes    Comment: "I drink all I can"  . Drug use: Not Currently    Types: Marijuana, Methamphetamines    Comment: last used 04/10/2019    Home Medications Prior to Admission medications   Medication Sig Start Date End Date Taking? Authorizing Provider  chlordiazePOXIDE (LIBRIUM) 25 MG capsule 50mg  PO TID x 1D, then 25-50mg  PO BID X 1D, then 25-50mg  PO QD X 1D 01/15/21   Henderly, Britni A, PA-C  elvitegravir-cobicistat-emtricitabine-tenofovir (GENVOYA) 150-150-200-10 MG TABS tablet Take 1 tablet by mouth daily with breakfast. Patient not taking: No sig reported 09/28/20   Clapacs, 09/30/20, MD  famotidine (PEPCID) 20 MG tablet Take 1 tablet (20 mg total) by mouth 2 (two) times daily. Patient not taking: Reported on 06/01/2019 05/14/19 06/02/19  06/04/19, MD    Allergies    Tegretol [carbamazepine] and Caffeine  Review of Systems   Review of Systems  Unable to perform ROS: Mental status change    Physical Exam Updated Vital Signs BP 112/79   Pulse 85   Temp 98.5 F (36.9 C)   Resp 13   SpO2  99%   Physical Exam Vitals and nursing note reviewed.  Constitutional:      Appearance: He is well-developed.     Comments: Stuporous  HENT:     Head:     Comments: Left-sided forehead/scalp abrasion with mild bleeding Eyes:     Pupils: Pupils are equal, round, and reactive to light.     Comments: 4 mm and equal pupils  Cardiovascular:     Rate and Rhythm: Normal rate.  Pulmonary:     Effort: Pulmonary effort is normal.  Musculoskeletal:     Cervical back: Neck supple.     Comments: Moving all 4  extremities to noxious stimuli  Neurological:     Comments: Response to noxious stimuli only, positive gag reflex     ED Results / Procedures / Treatments   Labs (all labs ordered are listed, but only abnormal results are displayed) Labs Reviewed  COMPREHENSIVE METABOLIC PANEL - Abnormal; Notable for the following components:      Result Value   Calcium 8.3 (*)    AST 72 (*)    ALT 49 (*)    All other components within normal limits  ETHANOL - Abnormal; Notable for the following components:   Alcohol, Ethyl (B) 488 (*)    All other components within normal limits  CBC  RAPID URINE DRUG SCREEN, HOSP PERFORMED    EKG None  Radiology CT Head Wo Contrast  Result Date: 01/18/2021 CLINICAL DATA:  Head trauma EXAM: CT HEAD WITHOUT CONTRAST TECHNIQUE: Contiguous axial images were obtained from the base of the skull through the vertex without intravenous contrast. COMPARISON:  01/15/2021 FINDINGS: Brain: No evidence of acute infarction, hemorrhage, hydrocephalus, extra-axial collection or mass lesion/mass effect. Vascular: No hyperdense vessel or unexpected calcification. Skull: Negative for acute calvarial fracture. Sinuses/Orbits: No acute finding. Other: Negative for scalp hematoma. IMPRESSION: No acute intracranial findings. Electronically Signed   By: Duanne Guess D.O.   On: 01/18/2021 14:36    Procedures .Critical Care Performed by: Derwood Kaplan, MD Authorized by: Derwood Kaplan, MD   Critical care provider statement:    Critical care time (minutes):  38   Critical care was necessary to treat or prevent imminent or life-threatening deterioration of the following conditions:  Toxidrome   Critical care was time spent personally by me on the following activities:  Discussions with consultants, evaluation of patient's response to treatment, examination of patient, ordering and performing treatments and interventions, ordering and review of laboratory studies, ordering and  review of radiographic studies, pulse oximetry, re-evaluation of patient's condition, obtaining history from patient or surrogate and review of old charts     Medications Ordered in ED Medications  lactated ringers infusion ( Intravenous New Bag/Given 01/18/21 1504)  lactated ringers bolus 1,000 mL (1,000 mLs Intravenous New Bag/Given 01/18/21 1507)    ED Course  I have reviewed the triage vital signs and the nursing notes.  Pertinent labs & imaging results that were available during my care of the patient were reviewed by me and considered in my medical decision making (see chart for details).  Clinical Course as of 01/18/21 1701  Sat Jan 18, 2021  1453 Alcohol, Ethyl (B)(!!): 488 Significantly elevated ethanol level.  Patient has toxic encephalopathy right now.  I went to reassess him and now he is answering questions.  Still moving all 4 extremities.  CT C-spine was not done initially, I reassessed him and do not think a CT C-spine is needed as the suspicion for neck injury  is lower.  We will continue to reassess to see if that is needed. [AN]  1701 Continues to have no airway issues [AN]    Clinical Course User Index [AN] Derwood Kaplan, MD   MDM Rules/Calculators/A&P                          35 year old male comes in a chief complaint of fall/found down.  He has history of alcoholism and subdural hematoma and there is clear evidence of head trauma.  We will get CT head to make sure there is no bleed.  He is moving all 4 extremities which is reassuring.  Patient likely is significantly intoxicated.  Pupils are equal.  We will get the alcohol level and reassess.  Patient has been kept at 30 degrees to reduce the risk of aspiration.  Final Clinical Impression(s) / ED Diagnoses Final diagnoses:  Toxic effect of alcohol, unintentional, initial encounter    Rx / DC Orders ED Discharge Orders    None       Derwood Kaplan, MD 01/18/21 1701

## 2021-01-19 MED ORDER — ACETAMINOPHEN 500 MG PO TABS
1000.0000 mg | ORAL_TABLET | Freq: Once | ORAL | Status: AC
  Administered 2021-01-19: 1000 mg via ORAL
  Filled 2021-01-19: qty 2

## 2021-01-19 NOTE — ED Provider Notes (Signed)
  1:37 AM Patient awake, alert.  He was able to ambulate in the room.  He has been fed and given drink.  Vitals stable. At this point he has been observed for 12+ hours.  Stable for discharge.  He is currently homeless, given information for local shelters.   Garlon Hatchet, PA-C 01/19/21 0143    Shon Baton, MD 01/19/21 [phone]

## 2021-01-19 NOTE — Discharge Instructions (Addendum)
Drink responsibly. 

## 2021-01-19 NOTE — ED Notes (Signed)
Patient verbalizes understanding of discharge instructions. Opportunity for questioning and answers were provided. Armband removed by staff, pt discharged from ED ambulatory. Pt made aware of shelters near by that he can go to and sleep at since its cold outside. Pt states he is going to go a church that allows the homeless to stay.

## 2021-01-30 ENCOUNTER — Telehealth: Payer: Self-pay | Admitting: *Deleted

## 2021-01-30 NOTE — Telephone Encounter (Signed)
Received call from nurse at [facility] ([facility]). Patient is being discharged, wants to connect to [facility] so that he can resume medication before going to jail next week. He is homeless, was staying at a shelter in [facility] before hospitalization. Patient has no insurance, has not submitted ADAP/RW paper work. This RN asked for updated phone number [phone]) so case manager could reach out to him, advised that patient let the medical staff know of his diagnosis when he is incarcerated so they can complete paperwork and connect him to [facility] for medication and labs. Will route to [employer] as well. Andree Coss, RN

## 2021-02-11 ENCOUNTER — Emergency Department (HOSPITAL_COMMUNITY)
Admission: EM | Admit: 2021-02-11 | Discharge: 2021-02-12 | Disposition: A | Discharge status: Home / Self Care | Payer: Self-pay | Attending: Emergency Medicine | Admitting: Emergency Medicine

## 2021-02-11 ENCOUNTER — Other Ambulatory Visit: Payer: Self-pay

## 2021-02-11 ENCOUNTER — Emergency Department (HOSPITAL_COMMUNITY): Payer: Self-pay

## 2021-02-11 ENCOUNTER — Encounter (HOSPITAL_COMMUNITY): Payer: Self-pay | Admitting: Pharmacy Technician

## 2021-02-11 DIAGNOSIS — R1013 Epigastric pain: Secondary | ICD-10-CM | POA: Insufficient documentation

## 2021-02-11 DIAGNOSIS — Z20822 Contact with and (suspected) exposure to covid-19: Secondary | ICD-10-CM | POA: Insufficient documentation

## 2021-02-11 DIAGNOSIS — F1020 Alcohol dependence, uncomplicated: Secondary | ICD-10-CM

## 2021-02-11 DIAGNOSIS — Z21 Asymptomatic human immunodeficiency virus [HIV] infection status: Secondary | ICD-10-CM | POA: Insufficient documentation

## 2021-02-11 DIAGNOSIS — Z87891 Personal history of nicotine dependence: Secondary | ICD-10-CM | POA: Insufficient documentation

## 2021-02-11 DIAGNOSIS — R112 Nausea with vomiting, unspecified: Secondary | ICD-10-CM | POA: Insufficient documentation

## 2021-02-11 DIAGNOSIS — F1094 Alcohol use, unspecified with alcohol-induced mood disorder: Secondary | ICD-10-CM

## 2021-02-11 DIAGNOSIS — R45851 Suicidal ideations: Secondary | ICD-10-CM | POA: Insufficient documentation

## 2021-02-11 DIAGNOSIS — F1014 Alcohol abuse with alcohol-induced mood disorder: Secondary | ICD-10-CM

## 2021-02-11 DIAGNOSIS — F10920 Alcohol use, unspecified with intoxication, uncomplicated: Secondary | ICD-10-CM

## 2021-02-11 LAB — SALICYLATE LEVEL: Salicylate Lvl: <7 mg/dL — ABNORMAL LOW (ref 7.0–30.0)

## 2021-02-11 LAB — RESP PANEL BY RT-PCR (FLU A&B, COVID) ARPGX2
Influenza A by PCR: NEGATIVE
Influenza B by PCR: NEGATIVE
SARS Coronavirus 2 by RT PCR: NEGATIVE

## 2021-02-11 LAB — CBC WITH DIFFERENTIAL/PLATELET
Abs Immature Granulocytes: 0.01 10*3/uL (ref 0.00–0.07)
Basophils Absolute: 0.1 10*3/uL (ref 0.0–0.1)
Basophils Relative: 3 %
Eosinophils Absolute: 0.1 10*3/uL (ref 0.0–0.5)
Eosinophils Relative: 3 %
HCT: 42.8 % (ref 39.0–52.0)
Hemoglobin: 14.5 g/dL (ref 13.0–17.0)
Immature Granulocytes: 0 %
Lymphocytes Relative: 46 %
Lymphs Abs: 2.1 10*3/uL (ref 0.7–4.0)
MCH: 33.9 pg (ref 26.0–34.0)
MCHC: 33.9 g/dL (ref 30.0–36.0)
MCV: 100 fL (ref 80.0–100.0)
Monocytes Absolute: 0.2 10*3/uL (ref 0.1–1.0)
Monocytes Relative: 6 %
Neutro Abs: 1.8 10*3/uL (ref 1.7–7.7)
Neutrophils Relative %: 42 %
Platelets: 367 10*3/uL (ref 150–400)
RBC: 4.28 MIL/uL (ref 4.22–5.81)
RDW: 15.5 % (ref 11.5–15.5)
WBC: 4.4 10*3/uL (ref 4.0–10.5)
nRBC: 0 % (ref 0.0–0.2)

## 2021-02-11 LAB — COMPREHENSIVE METABOLIC PANEL
ALT: 43 U/L (ref 0–44)
AST: 48 U/L — ABNORMAL HIGH (ref 15–41)
Albumin: 4 g/dL (ref 3.5–5.0)
Alkaline Phosphatase: 46 U/L (ref 38–126)
Anion gap: 10 (ref 5–15)
BUN: 5 mg/dL — ABNORMAL LOW (ref 6–20)
CO2: 24 mmol/L (ref 22–32)
Calcium: 8.9 mg/dL (ref 8.9–10.3)
Chloride: 106 mmol/L (ref 98–111)
Creatinine, Ser: 0.68 mg/dL (ref 0.61–1.24)
GFR, Estimated: >60 mL/min (ref >60)
Glucose, Bld: 96 mg/dL (ref 70–99)
Potassium: 4.1 mmol/L (ref 3.5–5.1)
Sodium: 140 mmol/L (ref 135–145)
Total Bilirubin: 0.4 mg/dL (ref 0.3–1.2)
Total Protein: 7.2 g/dL (ref 6.5–8.1)

## 2021-02-11 LAB — ACETAMINOPHEN LEVEL: Acetaminophen (Tylenol), Serum: <10 ug/mL — ABNORMAL LOW (ref 10–30)

## 2021-02-11 LAB — LIPASE, BLOOD: Lipase: 69 U/L — ABNORMAL HIGH (ref 11–51)

## 2021-02-11 LAB — ETHANOL: Alcohol, Ethyl (B): 442 mg/dL (ref <10)

## 2021-02-11 IMAGING — CT CT HEAD W/O CM
4 series · 17 of 47 positions shown, 19 images · non-contrast
Comparison: Head CT dated [DATE].

CLINICAL DATA: 34-year-old male with head trauma.

EXAM:
CT HEAD WITHOUT CONTRAST
TECHNIQUE: Contiguous axial images were obtained from the base of the skull
through the vertex without intravenous contrast.

[Series 3: head (person_name) · axial · 0.45mm/px · z∈[+1338,+1474]mm · 7 of 37 slices shown, 9 images]
[im 5/37  brain]
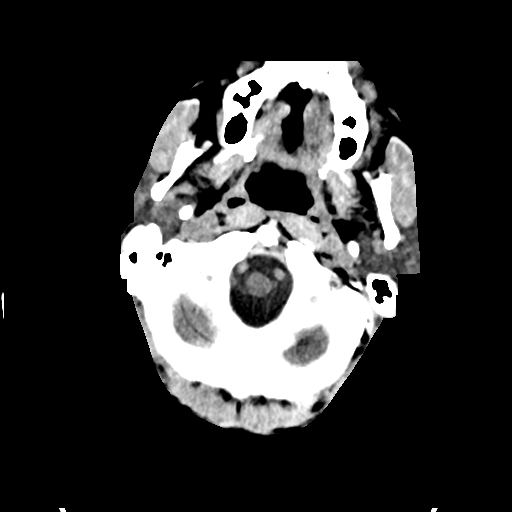
[im 5/37  bone]
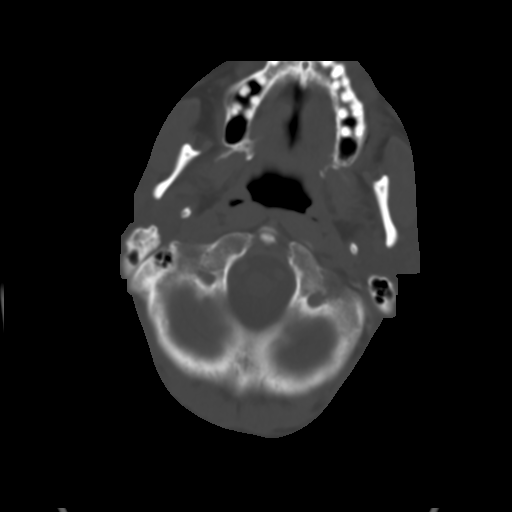
[im 10/37  brain]
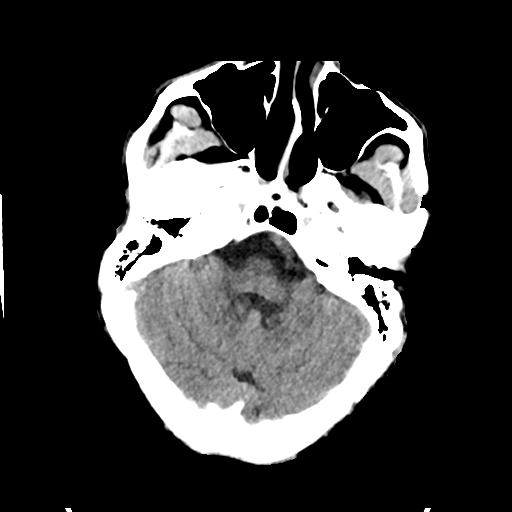
[im 14/37  brain]
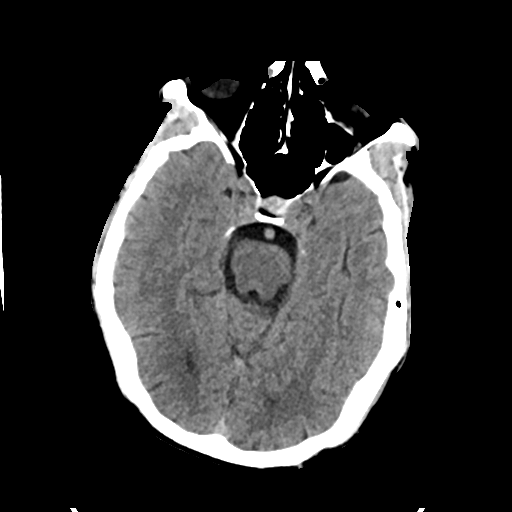
[im 19/37  brain]
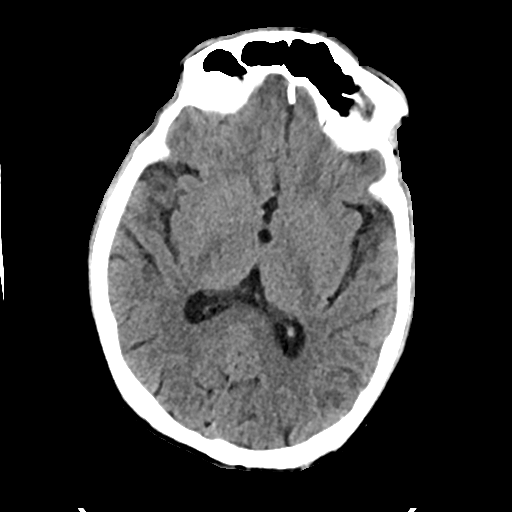
[im 23/37  brain]
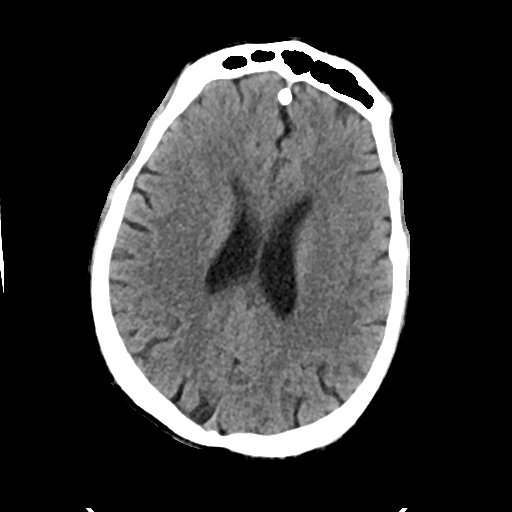
[im 23/37  bone]
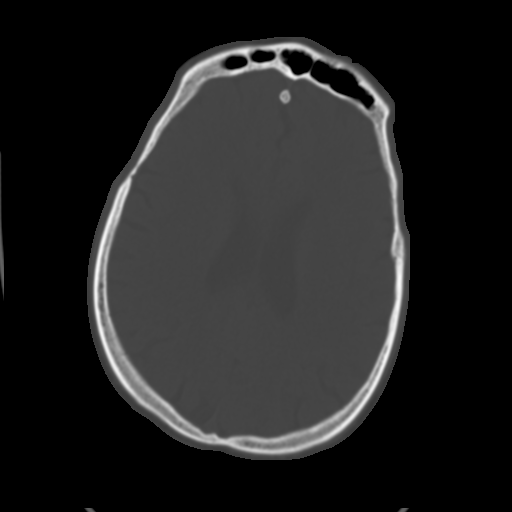
[im 28/37  brain]
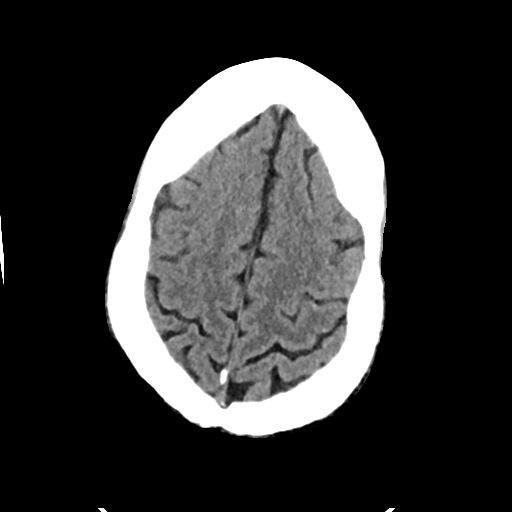
[im 32/37  brain]
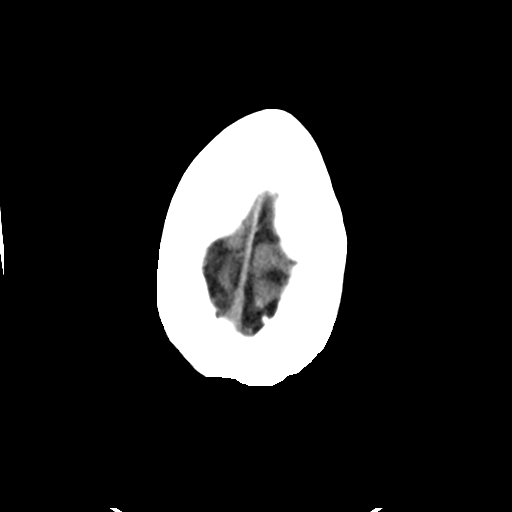

[Series 4: head bone (person_name) · axial · 0.45mm/px · z∈[+1336,+1398]mm · 4 of 92 slices shown]
[im 10/92  bone]
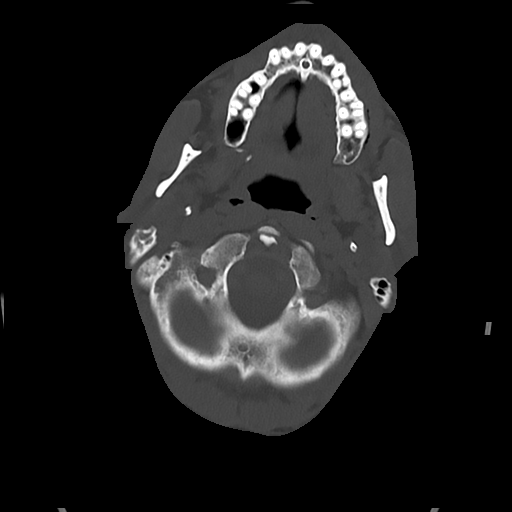
[im 19/92  bone]
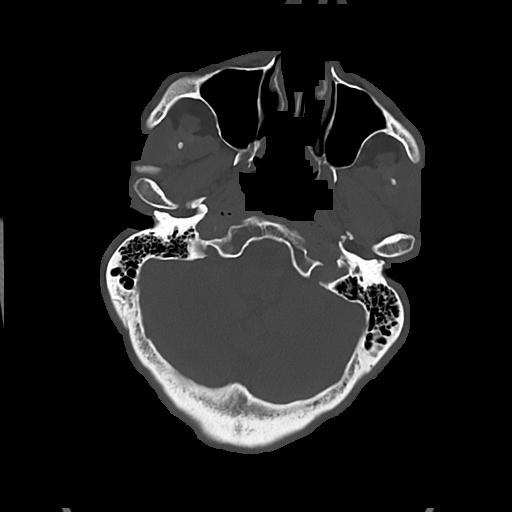
[im 28/92  bone]
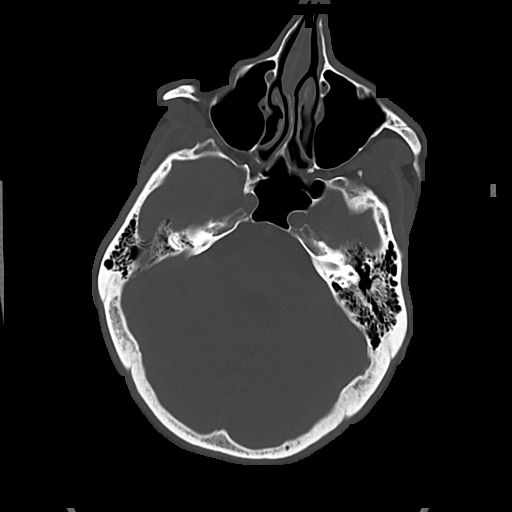
[im 41/92  bone]
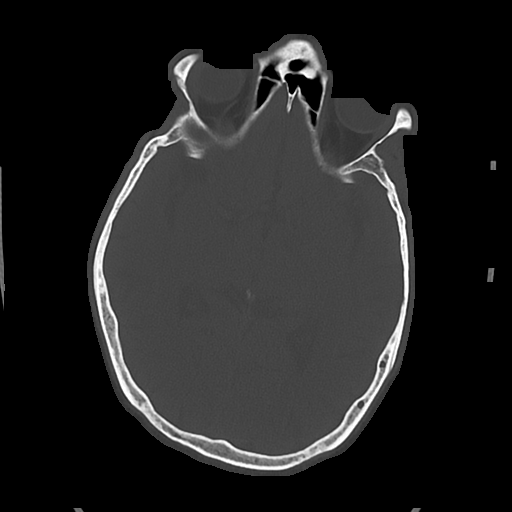

[Series 5: cor soft (person_name) · coronal · 0.34mm/px · 3 of 75 slices shown]
[im 25/75  brain]
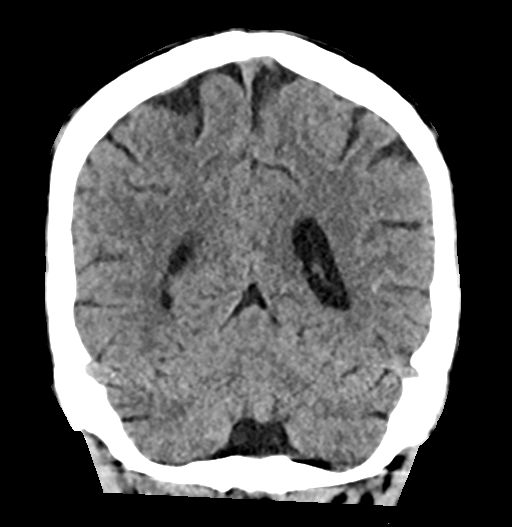
[im 33/75  brain]
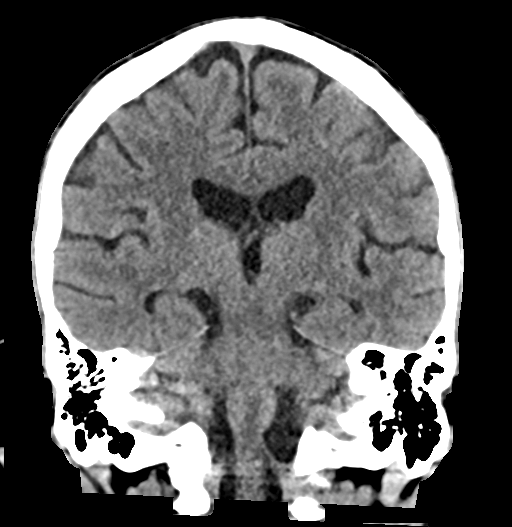
[im 42/75  brain]
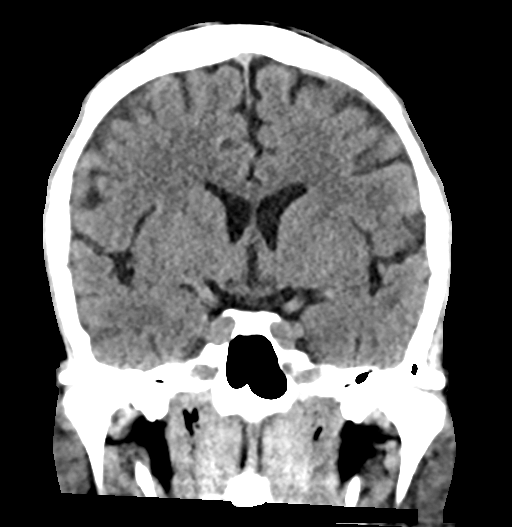

[Series 6: sag soft (person_name) · sagittal · 0.35mm/px · 3 of 58 slices shown]
[im 20/58  brain]
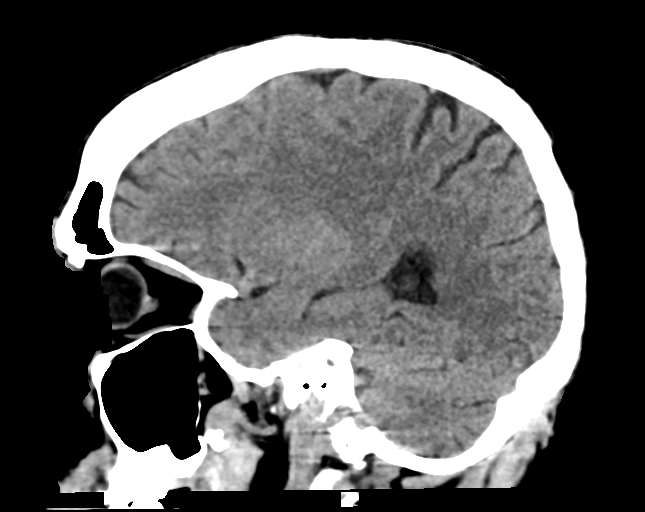
[im 29/58  brain]
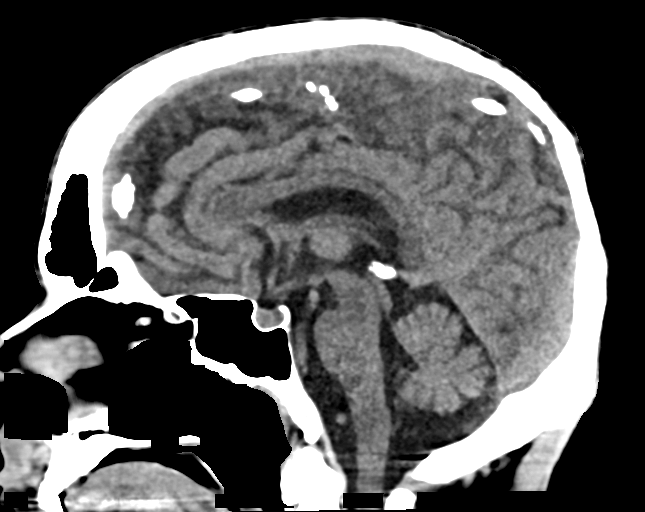
[im 39/58  brain]
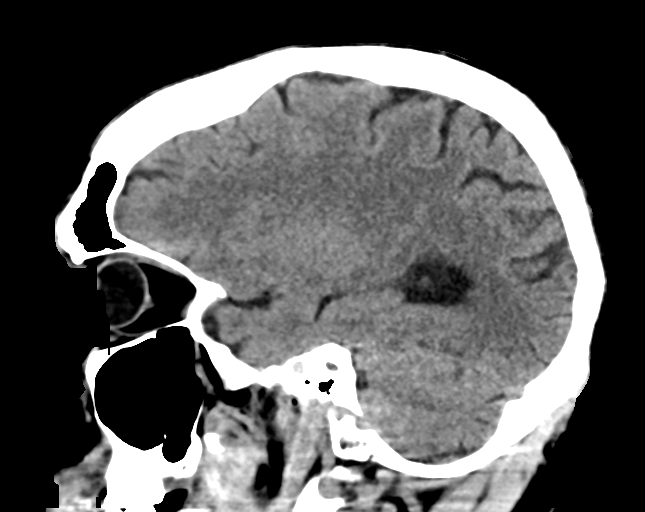

[17 of 47 positions shown; findings below may reference images not displayed]

FINDINGS: Brain: No evidence of acute infarction, hemorrhage, hydrocephalus,
extra-axial collection or mass lesion/mass effect.

Vascular: No hyperdense vessel or unexpected calcification.

Skull: Normal. Negative for fracture or focal lesion.

Sinuses/Orbits: No acute finding.

Other: None
IMPRESSION: Normal noncontrast CT of the brain.

## 2021-02-11 MED ORDER — ONDANSETRON HCL 4 MG/2ML IJ SOLN
4.0000 mg | Freq: Once | INTRAMUSCULAR | Status: AC
  Administered 2021-02-11: 4 mg via INTRAVENOUS
  Filled 2021-02-11: qty 2

## 2021-02-11 MED ORDER — THIAMINE HCL 100 MG PO TABS
100.0000 mg | ORAL_TABLET | Freq: Every day | ORAL | Status: DC
Start: 2021-02-11 — End: 2021-02-12
  Administered 2021-02-12: 100 mg via ORAL
  Filled 2021-02-11: qty 1

## 2021-02-11 MED ORDER — LORAZEPAM 1 MG PO TABS: 0.0000 mg | ORAL_TABLET | Freq: Two times a day (BID) | ORAL | Status: DC

## 2021-02-11 MED ORDER — LORAZEPAM 1 MG PO TABS
0.0000 mg | ORAL_TABLET | Freq: Four times a day (QID) | ORAL | Status: DC
Start: 2021-02-11 — End: 2021-02-12

## 2021-02-11 MED ORDER — THIAMINE HCL 100 MG/ML IJ SOLN
100.0000 mg | Freq: Every day | INTRAMUSCULAR | Status: DC
  Administered 2021-02-11: 100 mg via INTRAVENOUS
  Filled 2021-02-11: qty 2

## 2021-02-11 MED ORDER — LORAZEPAM 2 MG/ML IJ SOLN
0.0000 mg | Freq: Four times a day (QID) | INTRAMUSCULAR | Status: DC
Start: 2021-02-11 — End: 2021-02-12
  Administered 2021-02-11 – 2021-02-12 (×2): 2 mg via INTRAVENOUS
  Administered 2021-02-12: 4 mg via INTRAVENOUS
  Filled 2021-02-11 (×2): qty 1
  Filled 2021-02-11: qty 2

## 2021-02-11 MED ORDER — SODIUM CHLORIDE 0.9 % IV BOLUS
1000.0000 mL | Freq: Once | INTRAVENOUS | Status: AC
  Administered 2021-02-11: 1000 mL via INTRAVENOUS

## 2021-02-11 MED ORDER — LORAZEPAM 2 MG/ML IJ SOLN
0.0000 mg | Freq: Two times a day (BID) | INTRAMUSCULAR | Status: DC
Start: 2021-02-14 — End: 2021-02-12

## 2021-02-11 NOTE — ED Notes (Signed)
Pt belongings inventoried and placed in locker 12.

## 2021-02-11 NOTE — ED Notes (Addendum)
Weaned pt down to 1L O2 [place]. 100% O2 on 1L [place].

## 2021-02-11 NOTE — ED Triage Notes (Signed)
Pt bib ems with etoh intoxication in an attempt at [facility]. Pt normally drinks 6-7 bottles of wine daily.

## 2021-02-11 NOTE — ED Provider Notes (Signed)
[facility] EMERGENCY DEPARTMENT Provider Note   CSN: [id number] Arrival date & time: 02/11/21  1746     History Chief Complaint  Patient presents with  . Alcohol Intoxication    Gary [name] is a 35 y.o. male.  HPI   35 year old male with history of alcohol abuse, HIV, subdural hematoma, who presents to the emergency department today for evaluation of suicide attempt.  Patient states he was trying to drink enough alcohol to kill himself today.  He drinks about 5-6 bottles of wine daily and that is how much she was attempting to consume today.  He denies any coingestions denies any drug use.  Reports abdominal pain nausea and vomiting which she states is chronic  Past Medical History:  Diagnosis Date  . Alcohol abuse   . HIV (human immunodeficiency virus infection) (HCC)   . Subdural hematoma [facility])     Patient Active Problem List   Diagnosis Date Noted  . 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 Elevated troponin 10/19/2020  . Alcohol withdrawal (HCC) 10/19/2020  . Alcohol abuse   . Suicidal ideation 06/14/2020  . Pancreatitis 06/13/2020  . 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  . Malnutrition of moderate degree 05/07/2020  . Gastrointestinal hemorrhage   . Alcoholic hepatitis without ascites   . Melena 05/05/2020  . Alcoholic ketoacidosis 05/05/2020  . Thrombocytopenia (HCC) 07/25/2019  . Transaminitis 07/25/2019  . Traumatic subdural hematoma (HCC) 07/02/2019  . Alcohol abuse with intoxication (HCC) 07/02/2019  . Alcohol abuse with alcohol-induced mood disorder (HCC) 05/13/2019  . Alcohol dependence (HCC) 04/16/2019  . 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 HIV disease (HCC) 06/16/2018  . Polysubstance abuse (HCC) 06/16/2018  . Cigarette smoker 06/16/2018    Past Surgical History:  Procedure Laterality Date  . INCISION AND DRAINAGE PERIRECTAL ABSCESS N/A 09/24/2016   Procedure:  IRRIGATION AND DEBRIDEMENT PERIRECTAL ABSCESS;  Surgeon: Ricarda Frame, MD;  Location: [facility] ORS;  Service: General;  Laterality: N/A;  . none         Family History  Problem Relation Age of Onset  . Alcohol abuse Mother   . Alcohol abuse Father     Social History   Tobacco Use  . Smoking status: Former Smoker    Packs/day: 0.50    Types: Cigarettes  . Smokeless tobacco: Never Used  . Tobacco comment: unable to smoke while incarcerated 6+ months 02/13/20  Vaping Use  . Vaping Use: Never used  Substance Use Topics  . Alcohol use: Yes    Comment: "I drink all I can"  . Drug use: Not Currently    Types: Marijuana, Methamphetamines    Comment: last used 04/10/2019    Home Medications Prior to Admission medications   Medication Sig Start Date End Date Taking? Authorizing Provider  chlordiazePOXIDE (LIBRIUM) 25 MG capsule 50mg  PO TID x 1D, then 25-50mg  PO BID X 1D, then 25-50mg  PO QD X 1D 01/15/21   Henderly, Britni A, PA-C  elvitegravir-cobicistat-emtricitabine-tenofovir (GENVOYA) 150-150-200-10 MG TABS tablet Take 1 tablet by mouth daily with breakfast. Patient not taking: No sig reported 09/28/20   Clapacs, 09/30/20, MD  famotidine (PEPCID) 20 MG tablet Take 1 tablet (20 mg total) by mouth 2 (two) times daily. Patient not taking: Reported on 06/01/2019 05/14/19 06/02/19  06/04/19, MD    Allergies    Tegretol [carbamazepine] and Caffeine  Review of Systems   Review of Systems  Constitutional: Negative for fever.  HENT: Negative  for ear pain and sore throat.   Eyes: Negative for visual disturbance.  Respiratory: Negative for cough and shortness of breath.   Cardiovascular: Negative for chest pain.  Gastrointestinal: Positive for abdominal pain, nausea and vomiting.  Genitourinary: Negative for dysuria and hematuria.  Musculoskeletal: Negative for back pain.  Skin: Negative for color change and rash.  Neurological: Negative for headaches.  Psychiatric/Behavioral:  Positive for suicidal ideas.  All other systems reviewed and are negative.   Physical Exam Updated Vital Signs BP 105/69   Pulse 69   Temp 98.2 F (36.8 C) (Oral)   Resp 16   SpO2 94%   Physical Exam Vitals and nursing note reviewed.  Constitutional:      Appearance: He is well-developed.  HENT:     Head: Normocephalic and atraumatic.  Eyes:     Extraocular Movements: Extraocular movements intact.     Conjunctiva/sclera: Conjunctivae normal.     Pupils: Pupils are equal, round, and reactive to light.     Comments: bilat nystagmus  Cardiovascular:     Rate and Rhythm: Normal rate and regular rhythm.     Heart sounds: Normal heart sounds. No murmur heard.   Pulmonary:     Effort: Pulmonary effort is normal. No respiratory distress.     Breath sounds: Normal breath sounds. No wheezing, rhonchi or rales.  Abdominal:     General: Bowel sounds are normal.     Palpations: Abdomen is soft.     Tenderness: There is abdominal tenderness (epigastric).  Musculoskeletal:     Cervical back: Neck supple.  Skin:    General: Skin is warm and dry.  Neurological:     Mental Status: He is alert.     Comments: Alert, oriented but appears intoxicated, follows commands, moving all extremities with normal coordination     ED Results / Procedures / Treatments   Labs (all labs ordered are listed, but only abnormal results are displayed) Labs Reviewed  ETHANOL - Abnormal; Notable for the following components:      Result Value   Alcohol, Ethyl (B) 442 (*)    All other components within normal limits  COMPREHENSIVE METABOLIC PANEL - Abnormal; Notable for the following components:   BUN 5 (*)    AST 48 (*)    All other components within normal limits  LIPASE, BLOOD - Abnormal; Notable for the following components:   Lipase 69 (*)    All other components within normal limits  SALICYLATE LEVEL - Abnormal; Notable for the following components:   Salicylate Lvl <7.0 (*)    All other  components within normal limits  ACETAMINOPHEN LEVEL - Abnormal; Notable for the following components:   Acetaminophen (Tylenol), Serum <10 (*)    All other components within normal limits  RESP PANEL BY RT-PCR (FLU A&B, COVID) ARPGX2  CBC WITH DIFFERENTIAL/PLATELET  RAPID URINE DRUG SCREEN, HOSP PERFORMED  I-STAT VENOUS BLOOD GAS, ED    EKG None  Radiology CT Head Wo Contrast  Result Date: 02/11/2021 CLINICAL DATA:  35 year old male with head trauma. EXAM: CT HEAD WITHOUT CONTRAST TECHNIQUE: Contiguous axial images were obtained from the base of the skull through the vertex without intravenous contrast. COMPARISON:  Head CT dated 01/18/2021. FINDINGS: Brain: No evidence of acute infarction, hemorrhage, hydrocephalus, extra-axial collection or mass lesion/mass effect. Vascular: No hyperdense vessel or unexpected calcification. Skull: Normal. Negative for fracture or focal lesion. Sinuses/Orbits: No acute finding. Other: None IMPRESSION: Normal noncontrast CT of the brain. Electronically Signed  By: Elgie Collard M.D.   On: 02/11/2021 22:34    Procedures Procedures   Medications Ordered in ED Medications  LORazepam (ATIVAN) injection 0-4 mg (2 mg Intravenous Given 02/11/21 2005)    Or  LORazepam (ATIVAN) tablet 0-4 mg ( Oral See Alternative 02/11/21 2005)  LORazepam (ATIVAN) injection 0-4 mg (has no administration in time range)    Or  LORazepam (ATIVAN) tablet 0-4 mg (has no administration in time range)  thiamine tablet 100 mg ( Oral See Alternative 02/11/21 2006)    Or  thiamine (B-1) injection 100 mg (100 mg Intravenous Given 02/11/21 2006)  sodium chloride 0.9 % bolus 1,000 mL (0 mLs Intravenous Stopped 02/11/21 2115)  ondansetron (ZOFRAN) injection 4 mg (4 mg Intravenous Given 02/11/21 2005)    ED Course  I have reviewed the triage vital signs and the nursing notes.  Pertinent labs & imaging results that were available during my care of the patient were reviewed by me and  considered in my medical decision making (see chart for details).    MDM Rules/Calculators/A&P                          35 y/o M presenting for eval of SI and etoh intoxication  Reviewed/interpreted labs CBC is unremarkable CMP with mildly elevated AST, otherwise reassuring Lipase elevated at 69 UDS pending at shift change Acetaminophen neg Salicylate neg Covid negative  EKG - NSR  CT head neg  On reassessment, pt very somnolent. Likely secondary to etoh intox. He will still conversate when stimulated but will add VBG to assess CO2.   At shift change, care transitioned to [facility], PA-C with plan to f/u on pending VBG and reassess pt. He will need to be medically cleared once he has metabolized his alcohol.   Final Clinical Impression(s) / ED Diagnoses Final diagnoses:  Alcoholic intoxication without complication (HCC)  Suicidal thoughts    Rx / DC Orders ED Discharge Orders    None       Karrie Meres, PA-C 02/12/21 0006    Alvira Monday, MD 02/12/21 [phone]

## 2021-02-11 NOTE — ED Provider Notes (Incomplete)
[facility] EMERGENCY DEPARTMENT Provider Note   CSN: [id number] Arrival date & time: 02/11/21  1746     History Chief Complaint  Patient presents with  . Alcohol Intoxication    Gary [name] is a 35 y.o. male.  HPI   35 year old male with history of alcohol abuse, HIV, subdural hematoma, who presents to the emergency department today for evaluation of suicide attempt.  Patient states he was trying to drink enough alcohol to kill himself today.  He drinks about 5-6 bottles of wine daily and that is how much she was attempting to consume today.  He denies any coingestions denies any drug use.  Reports abdominal pain nausea and vomiting which she states is chronic  Past Medical History:  Diagnosis Date  . Alcohol abuse   . HIV (human immunodeficiency virus infection) (HCC)   . Subdural hematoma [facility])     Patient Active Problem List   Diagnosis Date Noted  . 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 Elevated troponin 10/19/2020  . Alcohol withdrawal (HCC) 10/19/2020  . Alcohol abuse   . Suicidal ideation 06/14/2020  . Pancreatitis 06/13/2020  . 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  . Malnutrition of moderate degree 05/07/2020  . Gastrointestinal hemorrhage   . Alcoholic hepatitis without ascites   . Melena 05/05/2020  . Alcoholic ketoacidosis 05/05/2020  . Thrombocytopenia (HCC) 07/25/2019  . Transaminitis 07/25/2019  . Traumatic subdural hematoma (HCC) 07/02/2019  . Alcohol abuse with intoxication (HCC) 07/02/2019  . Alcohol abuse with alcohol-induced mood disorder (HCC) 05/13/2019  . Alcohol dependence (HCC) 04/16/2019  . 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 HIV disease (HCC) 06/16/2018  . Polysubstance abuse (HCC) 06/16/2018  . Cigarette smoker 06/16/2018    Past Surgical History:  Procedure Laterality Date  . INCISION AND DRAINAGE PERIRECTAL ABSCESS N/A 09/24/2016   Procedure:  IRRIGATION AND DEBRIDEMENT PERIRECTAL ABSCESS;  Surgeon: Ricarda Frame, MD;  Location: [facility] ORS;  Service: General;  Laterality: N/A;  . none         Family History  Problem Relation Age of Onset  . Alcohol abuse Mother   . Alcohol abuse Father     Social History   Tobacco Use  . Smoking status: Former Smoker    Packs/day: 0.50    Types: Cigarettes  . Smokeless tobacco: Never Used  . Tobacco comment: unable to smoke while incarcerated 6+ months 02/13/20  Vaping Use  . Vaping Use: Never used  Substance Use Topics  . Alcohol use: Yes    Comment: "I drink all I can"  . Drug use: Not Currently    Types: Marijuana, Methamphetamines    Comment: last used 04/10/2019    Home Medications Prior to Admission medications   Medication Sig Start Date End Date Taking? Authorizing Provider  chlordiazePOXIDE (LIBRIUM) 25 MG capsule 50mg  PO TID x 1D, then 25-50mg  PO BID X 1D, then 25-50mg  PO QD X 1D 01/15/21   Henderly, Britni A, PA-C  elvitegravir-cobicistat-emtricitabine-tenofovir (GENVOYA) 150-150-200-10 MG TABS tablet Take 1 tablet by mouth daily with breakfast. Patient not taking: No sig reported 09/28/20   Clapacs, 09/30/20, MD  famotidine (PEPCID) 20 MG tablet Take 1 tablet (20 mg total) by mouth 2 (two) times daily. Patient not taking: Reported on 06/01/2019 05/14/19 06/02/19  06/04/19, MD    Allergies    Tegretol [carbamazepine] and Caffeine  Review of Systems   Review of Systems  Constitutional: Negative for fever.  HENT: Negative  for ear pain and sore throat.   Eyes: Negative for visual disturbance.  Respiratory: Negative for cough and shortness of breath.   Cardiovascular: Negative for chest pain.  Gastrointestinal: Positive for abdominal pain, nausea and vomiting.  Genitourinary: Negative for dysuria and hematuria.  Musculoskeletal: Negative for back pain.  Skin: Negative for color change and rash.  Neurological: Negative for headaches.  Psychiatric/Behavioral:  Positive for suicidal ideas.  All other systems reviewed and are negative.   Physical Exam Updated Vital Signs BP 108/76   Pulse 63   Temp 98.2 F (36.8 C) (Oral)   Resp 14   SpO2 98%   Physical Exam Vitals and nursing note reviewed.  Constitutional:      Appearance: He is well-developed.  HENT:     Head: Normocephalic and atraumatic.  Eyes:     Extraocular Movements: Extraocular movements intact.     Conjunctiva/sclera: Conjunctivae normal.     Pupils: Pupils are equal, round, and reactive to light.     Comments: bilat nystagmus  Cardiovascular:     Rate and Rhythm: Normal rate and regular rhythm.     Heart sounds: Normal heart sounds. No murmur heard.   Pulmonary:     Effort: Pulmonary effort is normal. No respiratory distress.     Breath sounds: Normal breath sounds. No wheezing, rhonchi or rales.  Abdominal:     General: Bowel sounds are normal.     Palpations: Abdomen is soft.     Tenderness: There is abdominal tenderness (epigastric).  Musculoskeletal:     Cervical back: Neck supple.  Skin:    General: Skin is warm and dry.  Neurological:     Mental Status: He is alert.     Comments: Alert, oriented but appears intoxicated, follows commands, moving all extremities with normal coordination     ED Results / Procedures / Treatments   Labs (all labs ordered are listed, but only abnormal results are displayed) Labs Reviewed  ETHANOL - Abnormal; Notable for the following components:      Result Value   Alcohol, Ethyl (B) 442 (*)    All other components within normal limits  COMPREHENSIVE METABOLIC PANEL - Abnormal; Notable for the following components:   BUN 5 (*)    AST 48 (*)    All other components within normal limits  LIPASE, BLOOD - Abnormal; Notable for the following components:   Lipase 69 (*)    All other components within normal limits  RESP PANEL BY RT-PCR (FLU A&B, COVID) ARPGX2  CBC WITH DIFFERENTIAL/PLATELET  RAPID URINE DRUG SCREEN, HOSP  PERFORMED  SALICYLATE LEVEL  ACETAMINOPHEN LEVEL    EKG None  Radiology No results found.  Procedures Procedures   Medications Ordered in ED Medications  LORazepam (ATIVAN) injection 0-4 mg (2 mg Intravenous Given 02/11/21 2005)    Or  LORazepam (ATIVAN) tablet 0-4 mg ( Oral See Alternative 02/11/21 2005)  LORazepam (ATIVAN) injection 0-4 mg (has no administration in time range)    Or  LORazepam (ATIVAN) tablet 0-4 mg (has no administration in time range)  thiamine tablet 100 mg ( Oral See Alternative 02/11/21 2006)    Or  thiamine (B-1) injection 100 mg (100 mg Intravenous Given 02/11/21 2006)  sodium chloride 0.9 % bolus 1,000 mL (1,000 mLs Intravenous New Bag/Given 02/11/21 2005)  ondansetron (ZOFRAN) injection 4 mg (4 mg Intravenous Given 02/11/21 2005)    ED Course  I have reviewed the triage vital signs and the nursing notes.  Pertinent labs &  imaging results that were available during my care of the patient were reviewed by me and considered in my medical decision making (see chart for details).    MDM Rules/Calculators/A&P                          35 y/o M presenting for eval of SI and etoh intoxication  Reviewed/interpreted labs CBC is unremarkable CMP with mildly elevated AST, otherwise reassuring Lipase elevated at 69 UDS pending at shift change Acetaminophen neg Salicylate neg Covid negative  EKG - NSR  CT head neg  On reassessment, pt very somnolent. Likely secondary to etoh intox. He will still conversate when stimulated but will add VBG to assess CO2.   At shift change, care transitioned to [facility], PA-C with plan to f/u on pending VBG and reassess pt. He will need to be medically cleared once he has metabolized his alcohol.   Final Clinical Impression(s) / ED Diagnoses Final diagnoses:  None    Rx / DC Orders ED Discharge Orders    None

## 2021-02-11 NOTE — ED Notes (Signed)
Writer into room to assess. Pt was changed into purple scrubs prior. Items at nurses station needing to be inventoried. Security unable to wand pt at this time due to pt being intoxicated and unable to stand. Will attempt at later time when pt is safe to stand at bedside. Pt window is open and sleeping at this time.

## 2021-02-11 NOTE — ED Triage Notes (Addendum)
Emergency Medicine Provider Triage Evaluation Note  Gary MORSS , a 35 y.o. male  was evaluated in triage.  Pt complains of alcohol withdrawal. Chronic alcohol abuse and crack cocaine use. Drink numerous bottles of wine daily. Last drank earlier this morning. Denies HI, and auditory/visual hallucinations. Admits to SI.   Physical Exam  BP 116/79 (BP Location: Left Arm)   Pulse 72   Temp 98.2 F (36.8 C) (Oral)   Resp 12   SpO2 97%  Gen:   Awake, no distress   HEENT:  Atraumatic Resp:  Normal effort  Cardiac:  Normal rate  Abd:   Nondistended, epigastric tenderness MSK:   Moves extremities without difficulty  Neuro:  Speech clear   Medical Decision Making  Medically screening exam initiated at 6:02 PM.  Appropriate orders placed.  RONAK DUQUETTE was informed that the remainder of the evaluation will be completed by another provider, this initial triage assessment does not replace that evaluation, and the importance of remaining in the ED until their evaluation is complete.  Clinical Impression  Patient presents with concerns about alcohol withdrawal. History of complicated withdrawal. Last drank alcohol today. Normal vitals at triage. Low suspicion for withdrawal right now. Epigastric tenderness. Labs ordered to rule out pancreatitis.    Mannie Stabile, PA-C 02/11/21 1807    Mannie Stabile, PA-C 02/11/21 1810

## 2021-02-11 NOTE — ED Notes (Addendum)
Pt O2 stauration dropped to 83% on RA. Placed pt on 2L O2.

## 2021-02-12 ENCOUNTER — Emergency Department (HOSPITAL_COMMUNITY)
Admission: EM | Admit: 2021-02-12 | Discharge: 2021-02-13 | Disposition: A | Discharge status: Home / Self Care | Payer: Self-pay | Attending: Emergency Medicine | Admitting: Emergency Medicine

## 2021-02-12 DIAGNOSIS — F1010 Alcohol abuse, uncomplicated: Secondary | ICD-10-CM

## 2021-02-12 DIAGNOSIS — Z87891 Personal history of nicotine dependence: Secondary | ICD-10-CM | POA: Insufficient documentation

## 2021-02-12 DIAGNOSIS — T5192XA Toxic effect of unspecified alcohol, intentional self-harm, initial encounter: Secondary | ICD-10-CM | POA: Insufficient documentation

## 2021-02-12 DIAGNOSIS — Z21 Asymptomatic human immunodeficiency virus [HIV] infection status: Secondary | ICD-10-CM | POA: Insufficient documentation

## 2021-02-12 DIAGNOSIS — F10129 Alcohol abuse with intoxication, unspecified: Secondary | ICD-10-CM | POA: Insufficient documentation

## 2021-02-12 DIAGNOSIS — Y908 Blood alcohol level of 240 mg/100 ml or more: Secondary | ICD-10-CM | POA: Insufficient documentation

## 2021-02-12 LAB — RAPID URINE DRUG SCREEN, HOSP PERFORMED
Amphetamines: NOT DETECTED
Barbiturates: NOT DETECTED
Benzodiazepines: POSITIVE — AB
Cocaine: NOT DETECTED
Opiates: NOT DETECTED
Tetrahydrocannabinol: NOT DETECTED

## 2021-02-12 LAB — I-STAT VENOUS BLOOD GAS, ED
Acid-Base Excess: 0 mmol/L (ref 0.0–2.0)
Bicarbonate: 27.1 mmol/L (ref 20.0–28.0)
Calcium, Ion: 1.03 mmol/L — ABNORMAL LOW (ref 1.15–1.40)
HCT: 40 % (ref 39.0–52.0)
Hemoglobin: 13.6 g/dL (ref 13.0–17.0)
O2 Saturation: 100 %
Potassium: 3.7 mmol/L (ref 3.5–5.1)
Sodium: 144 mmol/L (ref 135–145)
TCO2: 29 mmol/L (ref 22–32)
pCO2, Ven: 52.5 mmHg (ref 44.0–60.0)
pH, Ven: 7.32 (ref 7.250–7.430)
pO2, Ven: 205 mmHg — ABNORMAL HIGH (ref 32.0–45.0)

## 2021-02-12 LAB — CBC
HCT: 37.1 % — ABNORMAL LOW (ref 39.0–52.0)
Hemoglobin: 12.5 g/dL — ABNORMAL LOW (ref 13.0–17.0)
MCH: 33.1 pg (ref 26.0–34.0)
MCHC: 33.7 g/dL (ref 30.0–36.0)
MCV: 98.1 fL (ref 80.0–100.0)
Platelets: 260 10*3/uL (ref 150–400)
RBC: 3.78 MIL/uL — ABNORMAL LOW (ref 4.22–5.81)
RDW: 15.1 % (ref 11.5–15.5)
WBC: 5.2 10*3/uL (ref 4.0–10.5)
nRBC: 0 % (ref 0.0–0.2)

## 2021-02-12 LAB — COMPREHENSIVE METABOLIC PANEL
ALT: 35 U/L (ref 0–44)
AST: 40 U/L (ref 15–41)
Albumin: 4 g/dL (ref 3.5–5.0)
Alkaline Phosphatase: 40 U/L (ref 38–126)
Anion gap: 11 (ref 5–15)
BUN: 8 mg/dL (ref 6–20)
CO2: 25 mmol/L (ref 22–32)
Calcium: 8.8 mg/dL — ABNORMAL LOW (ref 8.9–10.3)
Chloride: 104 mmol/L (ref 98–111)
Creatinine, Ser: 0.74 mg/dL (ref 0.61–1.24)
GFR, Estimated: >60 mL/min (ref >60)
Glucose, Bld: 90 mg/dL (ref 70–99)
Potassium: 3.4 mmol/L — ABNORMAL LOW (ref 3.5–5.1)
Sodium: 140 mmol/L (ref 135–145)
Total Bilirubin: 0.6 mg/dL (ref 0.3–1.2)
Total Protein: 7.1 g/dL (ref 6.5–8.1)

## 2021-02-12 LAB — I-STAT BETA HCG BLOOD, ED (MC, WL, AP ONLY): I-stat hCG, quantitative: <5 m[IU]/mL (ref <5)

## 2021-02-12 LAB — ETHANOL: Alcohol, Ethyl (B): 432 mg/dL (ref <10)

## 2021-02-12 MED ORDER — LORAZEPAM 2 MG/ML IJ SOLN: 1.0000 mg | Freq: Once | INTRAMUSCULAR | Status: DC

## 2021-02-12 MED ORDER — ONDANSETRON HCL 4 MG/2ML IJ SOLN
4.0000 mg | Freq: Once | INTRAMUSCULAR | Status: AC
  Administered 2021-02-12: 4 mg via INTRAVENOUS
  Filled 2021-02-12: qty 2

## 2021-02-12 MED ORDER — CHLORDIAZEPOXIDE HCL 25 MG PO CAPS: ORAL_CAPSULE | ORAL | 0 refills | Status: DC

## 2021-02-12 MED ORDER — CHLORDIAZEPOXIDE HCL 25 MG PO CAPS
25.0000 mg | ORAL_CAPSULE | Freq: Once | ORAL | Status: AC
  Administered 2021-02-12: 25 mg via ORAL
  Filled 2021-02-12: qty 1

## 2021-02-12 NOTE — Discharge Instructions (Addendum)
It was our pleasure to provide your ER care today - we hope that you feel better.  Avoid alcohol use. Use resource guide provided to help access rehab program(s) in the area.   You may take librium as need, as prescribed, to help with withdrawal symptoms - no driving any time when drinking alcohol or if/when taking librium.  Follow up with primary care doctor in 1-2 weeks.   If mental health issues and/or crisis, you may go directly to [facility] - it is open 24/7 and walk-ins are welcome.   Return to ER if worse, new symptoms, fevers, trouble breathing, persistent vomiting, new or severe pain, or other concern.

## 2021-02-12 NOTE — ED Triage Notes (Addendum)
[place] [address]  Bystander called ems because he was slumped over. Pt reports SI attempt by ETOH. He denies using any other substances today. AMS r/t etoh. Pt endorses visual hallucinations. Received NS from EMS

## 2021-02-12 NOTE — ED Provider Notes (Signed)
Patient awake and alert, is eating and drinking - tolerated well.  Pt is breathing normally/comfortably, room air sats 98% currently.  Felt mildly shaky/tremulous earlier, now resolved.   Will give librium po.  Pt has normal mood and affect. Pt has no thoughts of harm to self, and is having no thoughts of suicide.   Pt encouraged to pursue sobriety and outpatient rehab options.   Patient currently appears stable for d/c.   Will give rx librium to help with withdrawal symptoms.   Return precautions provided.      Cathren Laine, MD 02/12/21 [phone]

## 2021-02-12 NOTE — ED Notes (Signed)
Pt taken off of Oxygen. O2 saturation at 100%.

## 2021-02-12 NOTE — ED Notes (Signed)
Patient verbalizes understanding of discharge instructions. Opportunity for questioning and answers were provided. Armband removed by staff, pt discharged from ED.  

## 2021-02-12 NOTE — ED Provider Notes (Signed)
6:29 AM Patient has been hemodynamically stable.  Had an episode of desaturation while observed.  Now off oxygen with saturations of 100%.  Was able to participate in meaningful evaluation by TTS.  They recommend overnight observation for stabilization, social work consultation to try and assist with rehab placement.  Consult placed to transition of care team.  Disposition to be determined by oncoming ED provider.   [name], PA-C 02/12/21 0630    Tegeler, Canary Brim, MD 02/12/21 0730

## 2021-02-12 NOTE — ED Notes (Signed)
Pt placed on 2 L. 

## 2021-02-12 NOTE — BH Assessment (Signed)
Comprehensive Clinical Assessment (CCA) Note  02/12/2021 Gary [name] [id number]   The patient demonstrates the following risk factors for suicide: Chronic risk factors for suicide include: homelessness, long history of untreated depression, substance abuse, unemployment, medical issues, minimal support. Acute risk factors for suicide include: patient is currently suicidal, active attempt to drink himself to death. Protective factors for this patient include: patient has a desire to get some help for himself, no HI/Psychosis. Considering these factors, the overall suicide risk at this point appears to be high. Patient is not appropriate for outpatient follow up.  Disposition:  Per Nira Conn, NP, patient is recommended for overnight observation for safety and stability with need to social work consult to facilitate placement in a drug rehab program  AIMS   Flowsheet Row [facility]  AIMS Total Score 17    AUDIT   Flowsheet Row [facility]  Alcohol Use Disorder Identification Test Final Score (AUDIT) 36    PHQ2-9   Flowsheet Row ED from 02/11/2021 in [facility]  PHQ-2 Total Score 6  PHQ-9 Total Score 23    Flowsheet Row ED from 02/11/2021 in [facility] EM[facility]GENCY DEPART[facility] ED from 01/15/2021 in [facility] EMERGENCY DEPARTMENT ED from 01/12/2021 in [facility]  C-SSRS RISK CATEGORY High Risk Error: Question 6 not populated Error: Q3, 4, or 5 should not be populated when Q2 is No       Chief Complaint:  Chief Complaint  Patient presents with  . Alcohol Intoxication  . Suicidal    [facility] presents in [facility] with alcohol intoxication with a BAL of 422 stating that he is suicidal and he is trying to drink himself to death. Patient states that  he has been drinking heavily for the past few years and states that he has tried to drink himself to death on several occasions.  Patient states that he is very depressed and he has severe anxiety.  He states that he has HIV and he has been homeless for the past three years.  Patient states that he has never had any mental health treatment in the past, but states that he has been to [facility] in [place] in the past.  Patient states that he has experienced some clean time here and there, but has continued to relapse.He states that he is currently not working, he states that he isolates and has minimal support.  He states that the only way he can sleep or eat is for him to drink. He states that on average he sleeps five hours per day and states that he has recently lost 10 pounds.  Patient states that he is not feeling quite as suicidal as he did when he came to the hospital.  When asked what type of treatment that he needs, he states that he needs substance abuse treatment, long term.   Patient is alert and oriented, his mood is depressed and his affect is flat.  His judgment, insight and impulse control are impaired.  His thoughts are organized and his memory intact.  He does not appear to be responding to any internal stimuli.     Visit Diagnosis: F10.94 Alcohol Induced Mood Disorder, F.10.20 Alcohol Use Disorder Severe   CCA Screening, Triage and Referral (STR)  Patient Reported Information How did you hear about [place]? Self  Referral name: No data  recorded Referral phone number: No data recorded  Whom do you see for routine medical problems? I don't have a doctor  Practice/Facility Name: No data recorded Practice/Facility Phone Number: No data recorded Name of Contact: No data recorded Contact Number: No data recorded Contact Fax Number: No data recorded Prescriber Name: No data recorded Prescriber Address (if known): No data recorded  What Is the Reason for Your Visit/Call Today? depression,  suicidal and alcohol intoxication  How Long Has This Been Causing You Problems? > than 6 months  What Do You Feel Would Help You the Most Today? Alcohol or Drug Use Treatment   Have You Recently Been in Any Inpatient Treatment (Hospital/Detox/Crisis Center/28-Day Program)? No  Name/Location of Program/Hospital:No data recorded How Long Were You There? No data recorded When Were You Discharged? No data recorded  Have You Ever Received Services From [facility] Before? Yes  Who Do You See at [facility]? patient has been seen in ED in the past   Have You Recently Had Any Thoughts About Hurting Yourself? Yes  Are You Planning to Commit Suicide/Harm Yourself At This time? Yes   Have you Recently Had Thoughts About Hurting Someone [name]? No  Explanation: No data recorded  Have You Used Any Alcohol or Drugs in the Past 24 Hours? Yes  How Long Ago Did You Use Drugs or Alcohol? 0000 (patient drank yesterday afternoon)  What Did You Use and How Much? two forties and two bottles of wine   Do You Currently Have a Therapist/Psychiatrist? No  Name of Therapist/Psychiatrist: No data recorded  Have You Been Recently Discharged From Any Office Practice or Programs? No  Explanation of Discharge From Practice/Program: No data recorded    CCA Screening Triage Referral Assessment Type of Contact: Tele-Assessment  Is this Initial or Reassessment? Initial Assessment  Date Telepsych consult ordered in [facility]:  02/11/2021  Time Telepsych consult ordered in [facility]:  0512   Patient Reported Information Reviewed? Yes  Patient Left Without Being Seen? No data recorded Reason for Not Completing Assessment: Patient unable to participate in interview. Currently too impaired.   Collateral Involvement: None noted   Does Patient Have a Automotive engineer Guardian? No data recorded Name and Contact of Legal Guardian: Self  If Minor and Not Living with Parent(s), Who has Custody? n/a  Is CPS  involved or ever been involved? Never  Is APS involved or ever been involved? Never   Patient Determined To Be At Risk for Harm To Self or Others Based on Review of Patient Reported Information or Presenting Complaint? Yes, for Self-Harm  Method: No data recorded Availability of Means: No data recorded Intent: No data recorded Notification Required: No data recorded Additional Information for Danger to Others Potential: No data recorded Additional Comments for Danger to Others Potential: No data recorded Are There Guns or Other Weapons in Your Home? No data recorded Types of Guns/Weapons: No data recorded Are These Weapons Safely Secured?                            No data recorded Who Could Verify You Are Able To Have These Secured: No data recorded Do You Have any Outstanding Charges, Pending Court Dates, Parole/Probation? No data recorded Contacted To Inform of Risk of Harm To Self or Others: Unable to Contact:   Location of Assessment: [facility] ED   Does Patient Present under Involuntary Commitment? No  IVC Papers Initial File Date: No  data recorded  [place]   Patient Currently Receiving the Following Services: Not Receiving Services   Determination of Need: Urgent (48 hours)   Options For Referral: Other: Comment (Substance Abuse Residential Treatment)     CCA Biopsychosocial Intake/Chief Complaint:  [facility] with alcohol intoxication with a BAL of 422 stating that he is suicidal and he is trying to drink himself to death. Patient states that he has been drinking heavily for the past few years and states that he has tried to drink himself to death on several occasions.  Patient states that he is very depressed and he has severe anxiety.  He states that he has HIV and he has been homeless for the past three years.  Patient states that he has never had any mental health treatment in the past, but states that he has been to [facility] [place]  in the past.  Patient states that he has experienced some clean time here and there, but has continued to relapse.He states that he is currently not working, he states that he isolates and has minimal support.  He states that the only way he can sleep or eat is for him to drink. He states that on average he sleeps five hours per day and states that he has recently lost 10 pounds.  Patient states that he is not feeling quite as suicidal as he did when he came to the hospital.  When asked what type of treatment that he needs, he states that he needs substance abuse treatment, long term.  Current Symptoms/Problems: Patient is presenting voluntarily for SI and Alcohol Abuse, patient with a plan to drink himself to death   Patient Reported Schizophrenia/Schizoaffective Diagnosis in Past: No   Strengths: None reported  Preferences: None reported  Abilities: None reported   Type of Services Patient Feels are Needed: None reported   Initial Clinical Notes/Concerns: None   Mental Health Symptoms Depression:  Change in energy/activity; Fatigue; Hopelessness; Increase/decrease in appetite; Sleep (too much or little); Tearfulness; Worthlessness   Duration of Depressive symptoms: Greater than two weeks   Mania:  None   Anxiety:   Fatigue   Psychosis:  None   Duration of Psychotic symptoms: No data recorded  Trauma:  None   Obsessions:  None   Compulsions:  None   Inattention:  None   Hyperactivity/Impulsivity:  N/A   Oppositional/Defiant Behaviors:  None   Emotional Irregularity:  Chronic feelings of emptiness   Other Mood/Personality Symptoms:  No data recorded   Mental Status Exam Appearance and self-care  Stature:  Average   Weight:  Thin   Clothing:  Disheveled   Grooming:  Neglected   Cosmetic use:  None   Posture/gait:  Slumped   Motor activity:  Slowed   Sensorium  Attention:  Normal   Concentration:  Normal   Orientation:  Person; Place; Situation;  Time; Object   Recall/memory:  Normal   Affect and Mood  Affect:  Depressed; Flat   Mood:  Depressed; Hopeless; Worthless   Relating  Eye contact:  Avoided   Facial expression:  Depressed; Sad   Attitude toward examiner:  Cooperative   Thought and Language  Speech flow: Slurred; Slow   Thought content:  Appropriate to Mood and Circumstances   Preoccupation:  Suicide   Hallucinations:  None   Organization:  No data recorded  Affiliated Computer ServicesExecutive Functions  Fund of Knowledge:  Fair   Intelligence:  Average   Abstraction:  Normal  Judgement:  Poor   Reality Testing:  Adequate   Insight:  Lacking; Poor   Decision Making:  Impulsive   Social Functioning  Social Maturity:  Isolates   Social Judgement:  Impropriety   Stress  Stressors:  Housing; Office manager Ability:  Exhausted   Skill Deficits:  None   Supports:  Support needed     Religion: Religion/Spirituality Are You A Religious Person?: No  Leisure/Recreation: Leisure / Recreation Do You Have Hobbies?: No  Exercise/Diet: Exercise/Diet Do You Exercise?: No Have You Gained or Lost A Significant Amount of Weight in the Past Six Months?: No Do You Follow a Special Diet?: No Do You Have Any Trouble Sleeping?: Yes Explanation of Sleeping Difficulties: Patient has difficulty sleeping, slleps 5 hrs per night on average   CCA Employment/Education Employment/Work Situation: Employment / Work Situation Employment situation: Unemployed Patient's job has been impacted by current illness: Yes Describe how patient's job has been impacted: unable to maintain employment What is the longest time patient has a held a job?: Unknown Where was the patient employed at that time?: Unknown Has patient ever been in the [employer]?: No  Education: Education Is Patient Currently Attending School?: No Last Grade Completed: 12 Name of High School: [employer] Did Garment/textile technologist From [employer]?: Yes Did Engineer, water?: No Did Designer, television/film set?: No Did You Have An Individualized Education Program (IIEP): No Did You Have Any Difficulty At [employer]?: No Patient's Education Has Been Impacted by Current Illness: No   CCA Family/Childhood History Family and Relationship History: Family history Marital status: Single Are you sexually active?: Yes What is your sexual orientation?: Homosexual Has your sexual activity been affected by drugs, alcohol, medication, or emotional stress?: Unknown Does patient have children?: No  Childhood History:  Childhood History By whom was/is the patient raised?: Mother,Father Additional childhood history information: parents divorced Description of patient's relationship with caregiver when they were a child: patient was relative close to his parents growing up Patient's description of current relationship with people who raised him/her: patient states that he currently does not have a close relationship with his parents How were you disciplined when you got in trouble as a child/adolescent?: None reported Does patient have siblings?: Yes Number of Siblings: 5 Description of patient's current relationship with siblings: patient states that he is not close to his siblings Did patient suffer any verbal/emotional/physical/sexual abuse as a child?: No Did patient suffer from severe childhood neglect?: No Has patient ever been sexually abused/assaulted/raped as an adolescent or adult?: No Was the patient ever a victim of a crime or a disaster?: No Witnessed domestic violence?: No Has patient been affected by domestic violence as an adult?: No  Child/Adolescent Assessment:     CCA Substance Use Alcohol/Drug Use: Alcohol / Drug Use Pain Medications: See MAR Prescriptions: See MAR Over the Counter: See MAR History of alcohol / drug use?: Yes Longest period of sobriety (when/how long): Unable to quantify Negative Consequences of Use:  Financial,Personal relationships,Work / School Withdrawal Symptoms: Blackouts,Change in blood pressure,Irritability,Nausea / Vomiting,Patient aware of relationship between substance abuse and physical/medical complications,Seizures,Sweats Onset of Seizures: Unknown Date of most recent seizure: not assessed Substance #1 Name of Substance 1: Alcohol 1 - Age of First Use: not assessed 1 - Amount (size/oz): 6-7 bottles of wine daily 1 - Frequency: daily 1 - Duration: past few years 1 - Last Use / Amount: 2 bottles of wine and 2 forties 1 - Method of Aquiring:  purchase from store 1- Route of Use: oral                       ASAM's:  Six Dimensions of Multidimensional Assessment  Dimension 1:  Acute Intoxication and/or Withdrawal Potential:   Dimension 1:  Description of individual's past and current experiences of substance use and withdrawal: Patient has a history of withdrawal seizures  Dimension 2:  Biomedical Conditions and Complications:   Dimension 2:  Description of patient's biomedical conditions and  complications: Patient has several medical issues negatively impacted by his use of alcohol  Dimension 3:  Emotional, Behavioral, or Cognitive Conditions and Complications:  Dimension 3:  Description of emotional, behavioral, or cognitive conditions and complications: Patient states that he is severely depressed and suicidal  Dimension 4:  Readiness to Change:  Dimension 4:  Description of Readiness to Change criteria: Patient states that he is ready to amke positve changes in his life and states that he wants to stop drinking  Dimension 5:  Relapse, Continued use, or Continued Problem Potential:  Dimension 5:  Relapse, continued use, or continued problem potential critiera description: Patient has a history of chronic relapses  Dimension 6:  Recovery/Living Environment:  Dimension 6:  Recovery/Iiving environment criteria description: Patient is hoeless and has little emotional  suupport  ASAM Severity Score: ASAM's Severity Rating Score: 13  ASAM Recommended Level of Treatment: ASAM Recommended Level of Treatment: Level III Residential Treatment   Substance use Disorder (SUD) Substance Use Disorder (SUD)  Checklist Symptoms of Substance Use: Continued use despite having a persistent/recurrent physical/psychological problem caused/exacerbated by use,Continued use despite persistent or recurrent social, interpersonal problems, caused or exacerbated by use,Evidence of tolerance,Evidence of withdrawal (Comment),Large amounts of time spent to obtain, use or recover from the substance(s),Persistent desire or unsuccessful efforts to cut down or control use,Presence of craving or strong urge to use,Recurrent use that results in a failure to fulfill major role obligations (work, school, home),Repeated use in physically hazardous situations,Social, occupational, recreational activities given up or reduced due to use,Substance(s) often taken in larger amounts or over longer times than was intended  Recommendations for Services/Supports/Treatments: Recommendations for Services/Supports/Treatments Recommendations For Services/Supports/Treatments: Residential-Level 3  DSM5 Diagnoses: Patient Active Problem List   Diagnosis Date Noted  . 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 Elevated troponin 10/19/2020  . Alcohol withdrawal (HCC) 10/19/2020  . Alcohol abuse   . Suicidal ideation 06/14/2020  . Pancreatitis 06/13/2020  . 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  . Malnutrition of moderate degree 05/07/2020  . Gastrointestinal hemorrhage   . Alcoholic hepatitis without ascites   . Melena 05/05/2020  . Alcoholic ketoacidosis 05/05/2020  . Thrombocytopenia (HCC) 07/25/2019  . Transaminitis 07/25/2019  . Traumatic subdural hematoma (HCC) 07/02/2019  . Alcohol abuse with intoxication (HCC) 07/02/2019  . 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 Alcohol use  disorder, severe, dependence (HCC) 04/16/2019  . 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 HIV disease (HCC) 06/16/2018  . Polysubstance abuse (HCC) 06/16/2018  . Cigarette smoker 06/16/2018        Referrals to Alternative Service(s): Referred to Alternative Service(s):   Place:   Date:   Time:    Referred to Alternative Service(s):   Place:   Date:   Time:    Referred to Alternative Service(s):   Place:   Date:   Time:    Referred to Alternative Service(s):   Place:   Date:   Time:  Keimora Swartout J Serenitee Fuertes, LCAS

## 2021-02-13 LAB — RAPID URINE DRUG SCREEN, HOSP PERFORMED
Amphetamines: NOT DETECTED
Barbiturates: NOT DETECTED
Benzodiazepines: POSITIVE — AB
Cocaine: NOT DETECTED
Opiates: NOT DETECTED
Tetrahydrocannabinol: NOT DETECTED

## 2021-02-13 MED ORDER — ONDANSETRON HCL 4 MG/2ML IJ SOLN: 4.0000 mg | Freq: Once | INTRAMUSCULAR | Status: DC

## 2021-02-13 MED ORDER — ONDANSETRON HCL 4 MG PO TABS
4.0000 mg | ORAL_TABLET | Freq: Three times a day (TID) | ORAL | Status: DC | PRN
  Administered 2021-02-13: 4 mg via ORAL
  Filled 2021-02-13: qty 1

## 2021-02-13 MED ORDER — ACETAMINOPHEN 325 MG PO TABS: 650.0000 mg | ORAL_TABLET | ORAL | Status: DC | PRN

## 2021-02-13 NOTE — ED Notes (Signed)
Pt agreeing to change into hospital provided burgundy scrubs. Pt attempting to provide urine specimen while changing.
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
## 2021-02-13 NOTE — ED Notes (Signed)
Pt resting. Respirations even and unlabored.

## 2021-02-13 NOTE — Discharge Instructions (Addendum)
Follow-up with your primary doctor.  Seek help with your substance abuse.

## 2021-02-13 NOTE — ED Notes (Signed)
Pt unable to provide urine specimen. Given urine collection cup. Pt sts he urinated in toilet instead.

## 2021-02-13 NOTE — ED Notes (Signed)
Patient given his discharge papers, his belongings back and a bus pass. Encouraged to follow-up with substance abuse. Patient is alert and oriented, ambulatory with steady gait.

## 2021-02-13 NOTE — ED Notes (Signed)
Pt asleep.  Respirations even and unlabored

## 2021-02-14 ENCOUNTER — Telehealth: Payer: Self-pay

## 2021-02-14 NOTE — Telephone Encounter (Signed)
Jay Schlichter contacted [facility] in reference to patients HMAP renewal. Patient is currently at [facility] being seen and hasn't been in care with [facility] for some time. Forwarded his call to Roe Coombs, our financial counselor to follow up in assisting him.   Milliana Reddoch Loyola Mast, RN

## 2021-02-19 ENCOUNTER — Encounter (HOSPITAL_COMMUNITY): Payer: Self-pay

## 2021-02-19 ENCOUNTER — Other Ambulatory Visit: Payer: Self-pay

## 2021-02-19 ENCOUNTER — Emergency Department (HOSPITAL_COMMUNITY)
Admission: EM | Admit: 2021-02-19 | Discharge: 2021-02-19 | Disposition: A | Discharge status: Home / Self Care | Payer: Self-pay | Attending: Emergency Medicine | Admitting: Emergency Medicine

## 2021-02-19 DIAGNOSIS — F10921 Alcohol use, unspecified with intoxication delirium: Secondary | ICD-10-CM

## 2021-02-19 DIAGNOSIS — Z87891 Personal history of nicotine dependence: Secondary | ICD-10-CM | POA: Insufficient documentation

## 2021-02-19 DIAGNOSIS — Z21 Asymptomatic human immunodeficiency virus [HIV] infection status: Secondary | ICD-10-CM | POA: Insufficient documentation

## 2021-02-19 DIAGNOSIS — Y908 Blood alcohol level of 240 mg/100 ml or more: Secondary | ICD-10-CM | POA: Insufficient documentation

## 2021-02-19 DIAGNOSIS — F10121 Alcohol abuse with intoxication delirium: Secondary | ICD-10-CM | POA: Insufficient documentation

## 2021-02-19 LAB — ETHANOL: Alcohol, Ethyl (B): 460 mg/dL (ref <10)

## 2021-02-19 MED ORDER — CHLORDIAZEPOXIDE HCL 25 MG PO CAPS: ORAL_CAPSULE | ORAL | 0 refills | Status: DC

## 2021-02-19 MED ORDER — LORAZEPAM 1 MG PO TABS
2.0000 mg | ORAL_TABLET | Freq: Once | ORAL | Status: AC
  Administered 2021-02-19: 2 mg via ORAL
  Filled 2021-02-19: qty 2

## 2021-02-19 NOTE — Discharge Instructions (Addendum)
Call your primary care doctor or specialist as discussed in the next 2-3 days.   Return immediately back to the ER if:  Your symptoms worsen within the next 12-24 hours. You develop new symptoms such as new fevers, persistent vomiting, new pain, shortness of breath, or new weakness or numbness, or if you have any other concerns.  

## 2021-02-19 NOTE — ED Triage Notes (Signed)
Pt reports he had 1/2 gallon of vodka & wine. Pt report he would like to detox. Pt reports he had wine this morning. Pt denies any SI or HI

## 2021-02-19 NOTE — ED Provider Notes (Addendum)
Patient appears clinically sober.  He is concerned he may be withdrawing, given some Ativan with improvement of symptoms.  Given a prescription of Librium to go home with.  Appears he was just given a prescription of Librium last week, will give adjusted amount.  Given a list of resources for alcohol withdrawal follow-up.  Advised return for worsening symptoms or any additional concerns.   Cheryll Cockayne, MD 02/19/21 1729    Cheryll Cockayne, MD 02/19/21 1740
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
## 2021-02-19 NOTE — ED Notes (Signed)
Reviewed discharge instructions with patient. Follow-up care and medications reviewed. Patient  verbalized understanding. Patient A&Ox4, VSS, and ambulatory with steady gait upon discharge.  °

## 2021-02-21 ENCOUNTER — Emergency Department (HOSPITAL_COMMUNITY): Payer: Self-pay

## 2021-02-21 ENCOUNTER — Encounter (HOSPITAL_COMMUNITY): Payer: Self-pay

## 2021-02-21 ENCOUNTER — Emergency Department (HOSPITAL_COMMUNITY)
Admission: EM | Admit: 2021-02-21 | Discharge: 2021-02-21 | Disposition: A | Discharge status: Home / Self Care | Payer: Self-pay | Attending: Emergency Medicine | Admitting: Emergency Medicine

## 2021-02-21 DIAGNOSIS — Y908 Blood alcohol level of 240 mg/100 ml or more: Secondary | ICD-10-CM | POA: Insufficient documentation

## 2021-02-21 DIAGNOSIS — Z87891 Personal history of nicotine dependence: Secondary | ICD-10-CM | POA: Insufficient documentation

## 2021-02-21 DIAGNOSIS — X58XXXA Exposure to other specified factors, initial encounter: Secondary | ICD-10-CM | POA: Insufficient documentation

## 2021-02-21 DIAGNOSIS — Z21 Asymptomatic human immunodeficiency virus [HIV] infection status: Secondary | ICD-10-CM | POA: Insufficient documentation

## 2021-02-21 DIAGNOSIS — F10920 Alcohol use, unspecified with intoxication, uncomplicated: Secondary | ICD-10-CM

## 2021-02-21 DIAGNOSIS — F10129 Alcohol abuse with intoxication, unspecified: Secondary | ICD-10-CM | POA: Insufficient documentation

## 2021-02-21 DIAGNOSIS — S0081XA Abrasion of other part of head, initial encounter: Secondary | ICD-10-CM | POA: Insufficient documentation

## 2021-02-21 LAB — CBC WITH DIFFERENTIAL/PLATELET
Abs Immature Granulocytes: 0.01 10*3/uL (ref 0.00–0.07)
Basophils Absolute: 0 10*3/uL (ref 0.0–0.1)
Basophils Relative: 1 %
Eosinophils Absolute: 0.1 10*3/uL (ref 0.0–0.5)
Eosinophils Relative: 2 %
HCT: 37.6 % — ABNORMAL LOW (ref 39.0–52.0)
Hemoglobin: 13.5 g/dL (ref 13.0–17.0)
Immature Granulocytes: 0 %
Lymphocytes Relative: 55 %
Lymphs Abs: 1.6 10*3/uL (ref 0.7–4.0)
MCH: 35.3 pg — ABNORMAL HIGH (ref 26.0–34.0)
MCHC: 35.9 g/dL (ref 30.0–36.0)
MCV: 98.4 fL (ref 80.0–100.0)
Monocytes Absolute: 0.3 10*3/uL (ref 0.1–1.0)
Monocytes Relative: 9 %
Neutro Abs: 1 10*3/uL — ABNORMAL LOW (ref 1.7–7.7)
Neutrophils Relative %: 33 %
Platelets: 173 10*3/uL (ref 150–400)
RBC: 3.82 MIL/uL — ABNORMAL LOW (ref 4.22–5.81)
RDW: 14.9 % (ref 11.5–15.5)
WBC: 3 10*3/uL — ABNORMAL LOW (ref 4.0–10.5)
nRBC: 0 % (ref 0.0–0.2)

## 2021-02-21 LAB — COMPREHENSIVE METABOLIC PANEL
ALT: 43 U/L (ref 0–44)
AST: 40 U/L (ref 15–41)
Albumin: 3.9 g/dL (ref 3.5–5.0)
Alkaline Phosphatase: 38 U/L (ref 38–126)
Anion gap: 7 (ref 5–15)
BUN: 6 mg/dL (ref 6–20)
CO2: 29 mmol/L (ref 22–32)
Calcium: 8.3 mg/dL — ABNORMAL LOW (ref 8.9–10.3)
Chloride: 110 mmol/L (ref 98–111)
Creatinine, Ser: 0.56 mg/dL — ABNORMAL LOW (ref 0.61–1.24)
GFR, Estimated: >60 mL/min (ref >60)
Glucose, Bld: 96 mg/dL (ref 70–99)
Potassium: 3.4 mmol/L — ABNORMAL LOW (ref 3.5–5.1)
Sodium: 146 mmol/L — ABNORMAL HIGH (ref 135–145)
Total Bilirubin: 0.6 mg/dL (ref 0.3–1.2)
Total Protein: 7 g/dL (ref 6.5–8.1)

## 2021-02-21 LAB — CBG MONITORING, ED: Glucose-Capillary: 90 mg/dL (ref 70–99)

## 2021-02-21 LAB — ETHANOL: Alcohol, Ethyl (B): 405 mg/dL (ref <10)

## 2021-02-21 IMAGING — CT CT HEAD W/O CM
3 series · 15 of 47 positions shown, 18 images · non-contrast
Comparison: [DATE]

CLINICAL DATA: ETOH intoxication. Altered mental status. Dizziness.
Feeling sick.

EXAM:
CT HEAD WITHOUT CONTRAST
TECHNIQUE: Contiguous axial images were obtained from the base of the skull
through the vertex without intravenous contrast.

[Series 2: head wo · axial · 0.47mm/px · z∈[-120,+25]mm · 9 of 35 slices shown, 12 images]
[im 3/35  brain]
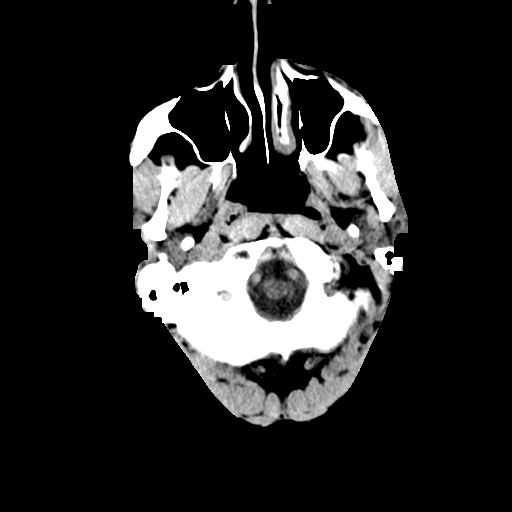
[im 3/35  bone]
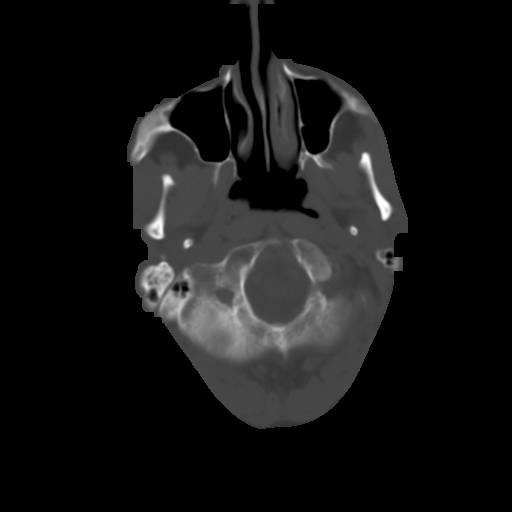
[im 6/35  brain]
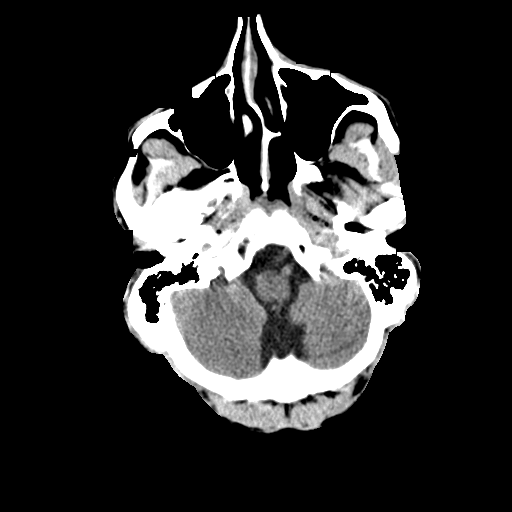
[im 10/35  brain]
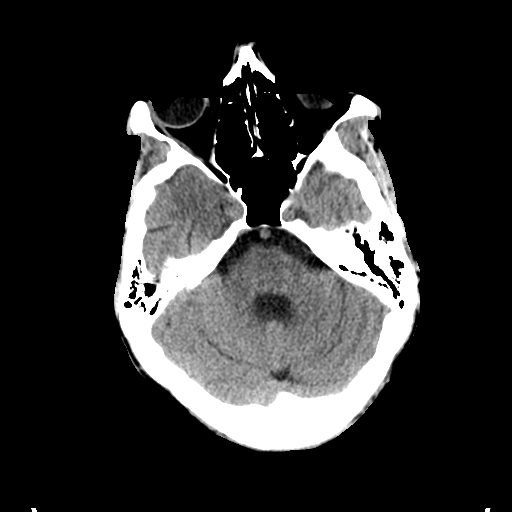
[im 13/35  brain]
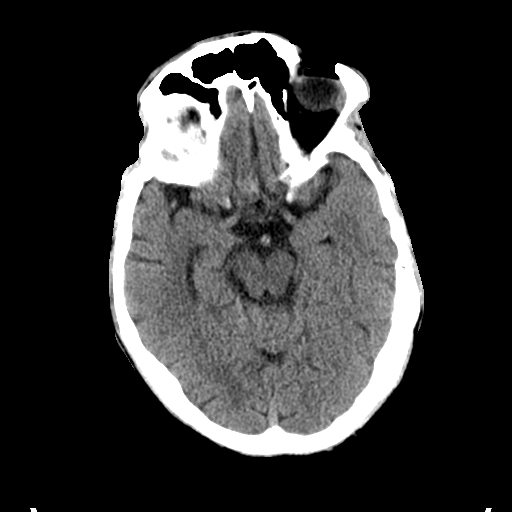
[im 18/35  brain]
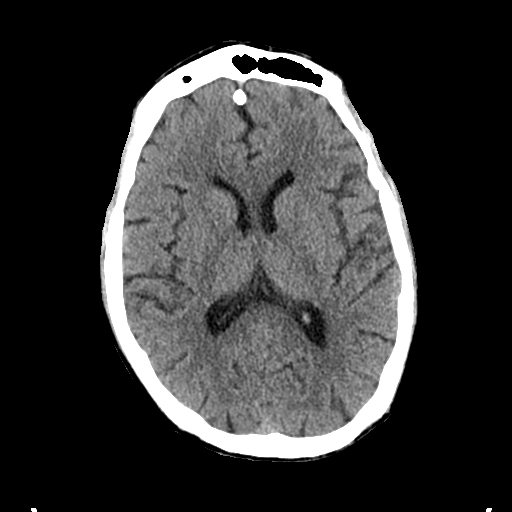
[im 18/35  bone]
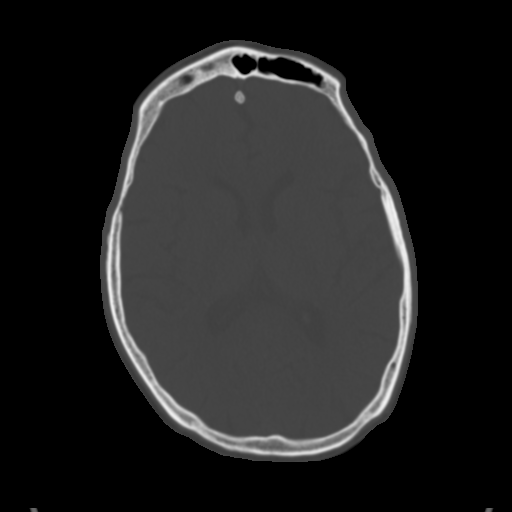
[im 22/35  brain]
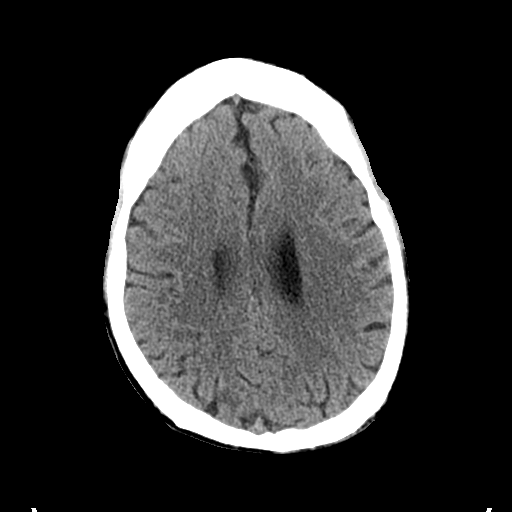
[im 25/35  brain]
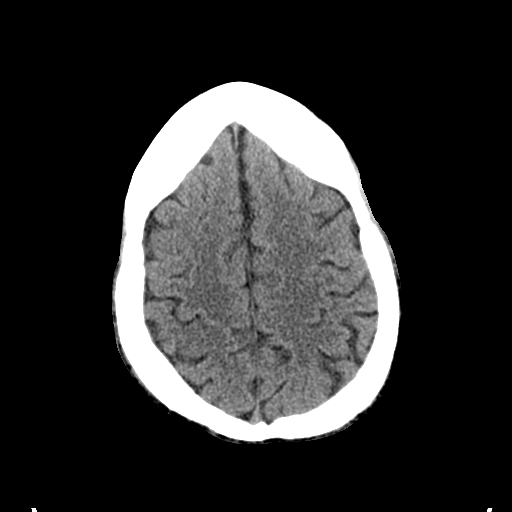
[im 29/35  brain]
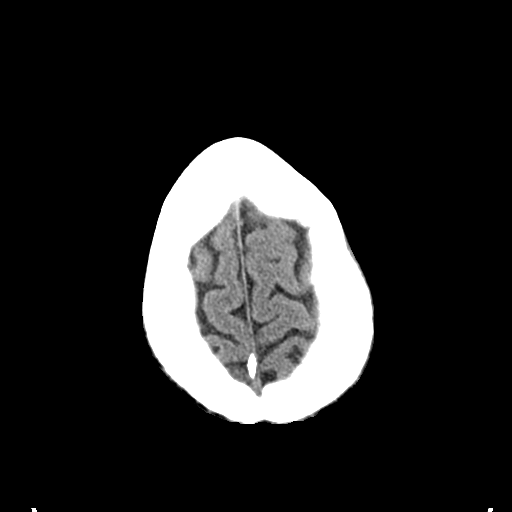
[im 32/35  brain]
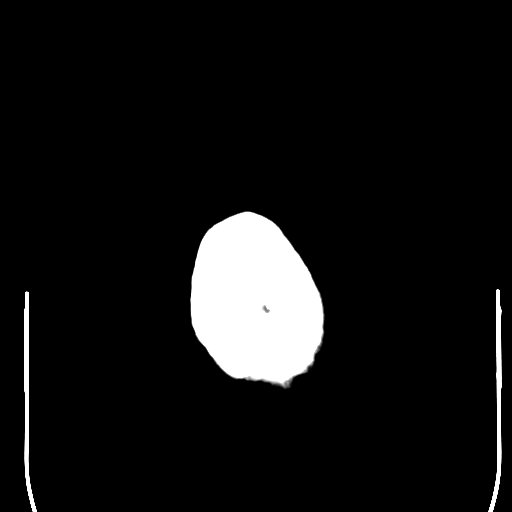
[im 32/35  bone]
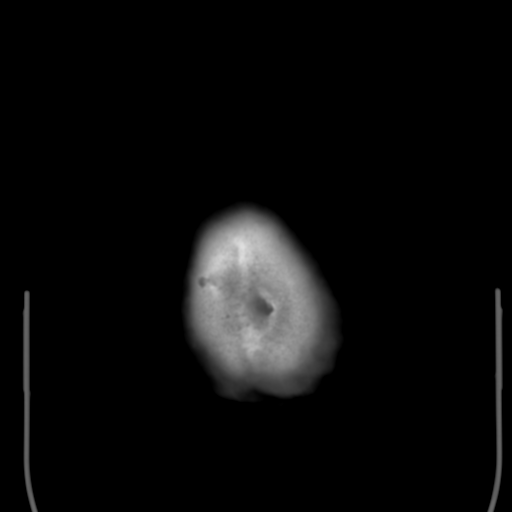

[Series 5: coronal soft tissue · coronal · 0.34mm/px · 3 of 83 slices shown]
[im 28/83  brain]
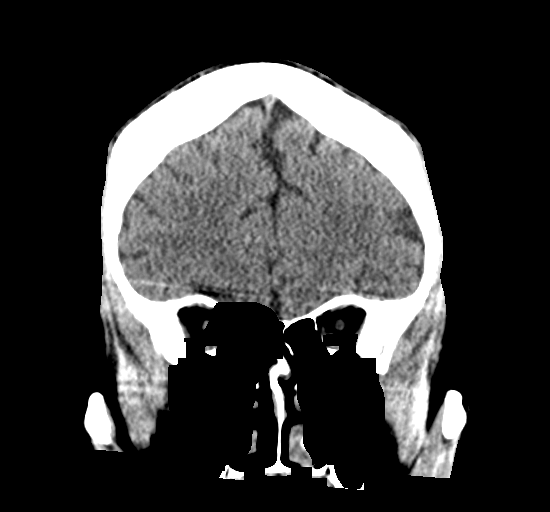
[im 37/83  brain]
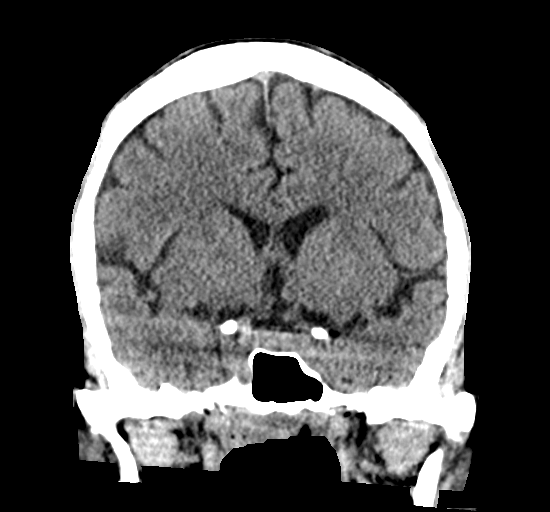
[im 46/83  brain]
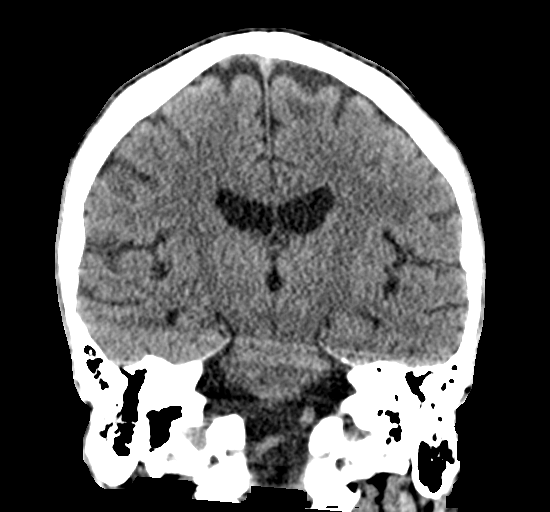

[Series 6: sagittal soft tissue · sagittal · 0.34mm/px · 3 of 58 slices shown]
[im 20/58  brain]
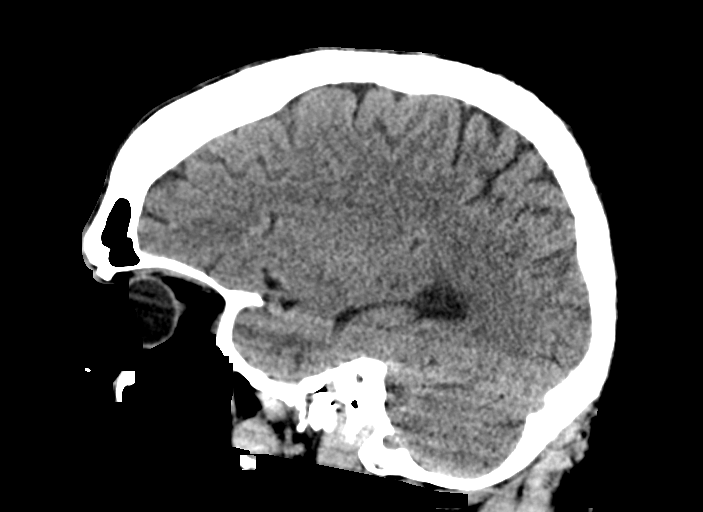
[im 29/58  brain]
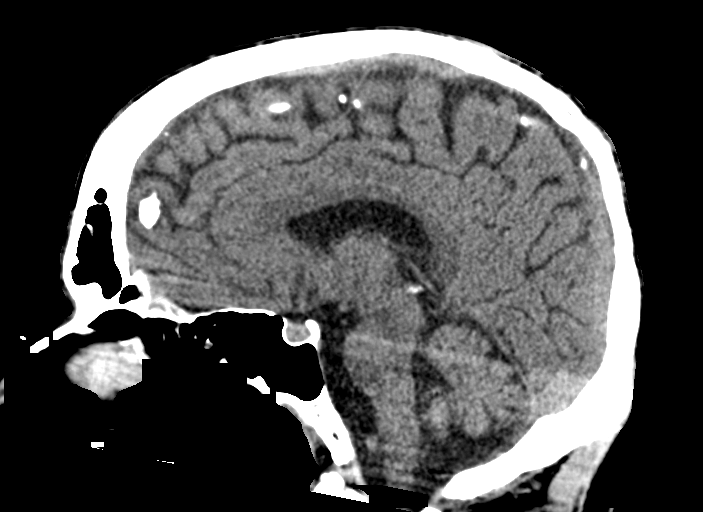
[im 39/58  brain]
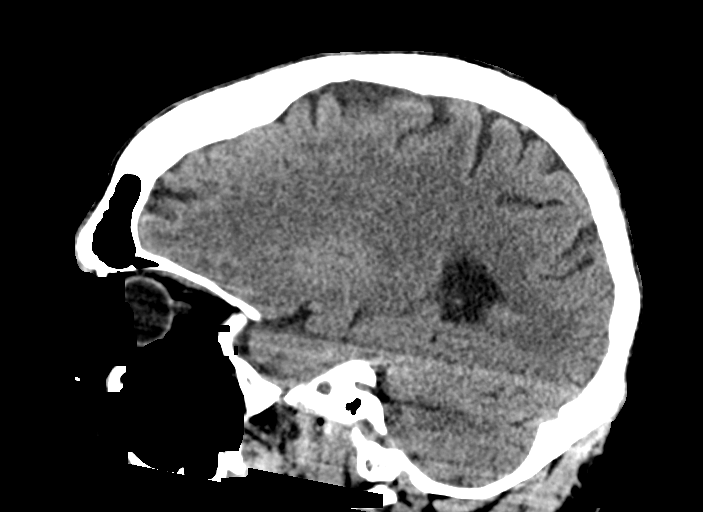

[15 of 47 positions shown; findings below may reference images not displayed]

FINDINGS: Brain: No evidence of acute infarction, hemorrhage, hydrocephalus,
extra-axial collection or mass lesion/mass effect.

Vascular: No hyperdense vessel or unexpected calcification.

Skull: Normal. Negative for fracture or focal lesion.

Sinuses/Orbits: Globes and orbits are unremarkable. Sinuses are
clear.

Other: None.
IMPRESSION: Negative unenhanced CT scan of the brain. No change from the prior
study.

## 2021-02-21 MED ORDER — THIAMINE HCL 100 MG/ML IJ SOLN
Freq: Once | INTRAVENOUS | Status: AC
  Filled 2021-02-21: qty 1000

## 2021-02-21 NOTE — ED Notes (Signed)
Provider at bedside to evaluate.

## 2021-02-21 NOTE — ED Notes (Addendum)
Pt ambulated without difficulty with this nurse as witness. Provider notified and aware
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
## 2021-02-21 NOTE — ED Notes (Signed)
Pt given ham sandwich, cheese stick, applesauce, and water.

## 2021-02-21 NOTE — ED Notes (Signed)
Pt back from CT at this time and moved to ED exam room 17.

## 2021-02-21 NOTE — ED Notes (Signed)
Pharmacy notified and aware of banana bag.

## 2021-02-21 NOTE — ED Notes (Signed)
Pt to CT at this time.

## 2021-02-21 NOTE — ED Triage Notes (Signed)
Pt presents to the ED via EMS for ETOH intoxication. [place] Pt called EMS and stated he didn't feel right and feels dizzy and sick. Per EMS, pt states he has been "drinking all day." He reports drinking two bottles of wine. Pt denies illicit drug use to EMS.  Pt states he is HIV positive. He denies SI/HI to EMS.

## 2021-02-23 ENCOUNTER — Ambulatory Visit (HOSPITAL_COMMUNITY)
Admission: EM | Admit: 2021-02-23 | Discharge: 2021-02-23 | Disposition: A | Discharge status: Other Acute Inpatient Hospital | Payer: No Payment, Other | Attending: Urology | Admitting: Urology

## 2021-02-23 ENCOUNTER — Emergency Department (HOSPITAL_COMMUNITY)
Admission: EM | Admit: 2021-02-23 | Discharge: 2021-02-24 | Disposition: A | Discharge status: Home / Self Care | Payer: Self-pay | Attending: Emergency Medicine | Admitting: Emergency Medicine

## 2021-02-23 ENCOUNTER — Other Ambulatory Visit: Payer: Self-pay

## 2021-02-23 DIAGNOSIS — M79671 Pain in right foot: Secondary | ICD-10-CM | POA: Insufficient documentation

## 2021-02-23 DIAGNOSIS — F10129 Alcohol abuse with intoxication, unspecified: Secondary | ICD-10-CM | POA: Insufficient documentation

## 2021-02-23 DIAGNOSIS — F10920 Alcohol use, unspecified with intoxication, uncomplicated: Secondary | ICD-10-CM

## 2021-02-23 DIAGNOSIS — Z87891 Personal history of nicotine dependence: Secondary | ICD-10-CM | POA: Insufficient documentation

## 2021-02-23 DIAGNOSIS — R4781 Slurred speech: Secondary | ICD-10-CM | POA: Insufficient documentation

## 2021-02-23 DIAGNOSIS — Z21 Asymptomatic human immunodeficiency virus [HIV] infection status: Secondary | ICD-10-CM | POA: Insufficient documentation

## 2021-02-23 DIAGNOSIS — Y908 Blood alcohol level of 240 mg/100 ml or more: Secondary | ICD-10-CM | POA: Insufficient documentation

## 2021-02-23 DIAGNOSIS — R45851 Suicidal ideations: Secondary | ICD-10-CM | POA: Insufficient documentation

## 2021-02-23 NOTE — ED Notes (Signed)
Patient transported via EMS. 

## 2021-02-23 NOTE — ED Triage Notes (Signed)
BH sent patient by EMS due to intoxication. A&Ox4 and ambulatory with assistance.  bp 116/70    Hr 90 95% RA  cbg 160

## 2021-02-23 NOTE — ED Provider Notes (Signed)
[facility] Medical Screening Exam  Patient Name: Gary [name] MRN: [id number] Date of Evaluation: 02/24/21 Chief Complaint:   Diagnosis:  Final diagnoses:  Alcohol abuse with intoxication (HCC)    History of Present illness: Gary [name] is a 35 y.o. male.  Patient with a history of HIV EtOH abuse pancreatitis and multiple ED visits over the past few weeks. Patient presented to [facility] via law enforcement with chief complaint of "alcohol detox." Patient report history alcohol withdraw seizure and DTs. He report his last alcoholic drink was "few hours before coming in."   Patient ill appearing and disheveled. He is somnolent, calm and cooperative, speech is slightly slurred and slow. patient endorses nausea, dizziness, mild chest discomfort, He denies shortness of breath, loss of consciousness, head trauma, bleeding/GI bleed.    Psychiatric Specialty Exam  Presentation  General Appearance:Disheveled  Eye Contact:Poor  Speech:Clear and Coherent  Speech Volume:Normal  Handedness:Right   Mood and Affect  Mood:Labile  Affect:Congruent   Thought Process  Thought Processes:Coherent  Descriptions of Associations:Intact  Orientation:Full (Time, Place and Person)  Thought Content:WDL  Diagnosis of Schizophrenia or Schizoaffective disorder in past: No   Hallucinations:None  Ideas of Reference:None  Suicidal Thoughts:Yes, Active With Plan ("drink myself to death")  Homicidal Thoughts:No   Sensorium  Memory:No data recorded Judgment:Good  Insight:Good   Executive Functions  Concentration:Fair  Attention Span:Fair  Recall:Fair  Fund of Knowledge:No data recorded Language:Good   Psychomotor Activity  Psychomotor Activity:Normal   Assets  Assets:No data recorded  Sleep  Sleep:Poor  Number of hours: No data recorded  No data recorded  Physical Exam: Physical Exam ROS Blood pressure 128/84, pulse (!) 113, temperature 98.2 F  (36.8 C), resp. rate 18, SpO2 95 %. There is no height or weight on file to calculate BMI.  Musculoskeletal: Strength & Muscle Tone: within normal limits and abnormal Gait & Station: unsteady Patient leans: Right   [facility] MSE Discharge Disposition for Follow up and Recommendations: Based on my evaluation the patient appears to have an emergency medical condition for which I recommend the patient be transferred to the emergency department for further evaluation. Due to history of alcohol withdraw seizures, DTs, and complaint of chest discomfort patient will be transfer to [facility]ED for medical clearance.    Maricela Bo, NP 02/24/2021, 6:16 AM
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
## 2021-02-23 NOTE — ED Notes (Signed)
Report called to Gary [name] Nurse, [facility] ED.  EMS activated to transport patient.

## 2021-02-24 ENCOUNTER — Encounter (HOSPITAL_COMMUNITY): Payer: Self-pay | Admitting: Student

## 2021-02-24 ENCOUNTER — Emergency Department (HOSPITAL_COMMUNITY): Payer: Self-pay

## 2021-02-24 LAB — COMPREHENSIVE METABOLIC PANEL
ALT: 37 U/L (ref 0–44)
AST: 47 U/L — ABNORMAL HIGH (ref 15–41)
Albumin: 3.7 g/dL (ref 3.5–5.0)
Alkaline Phosphatase: 39 U/L (ref 38–126)
Anion gap: 13 (ref 5–15)
BUN: 9 mg/dL (ref 6–20)
CO2: 22 mmol/L (ref 22–32)
Calcium: 8.6 mg/dL — ABNORMAL LOW (ref 8.9–10.3)
Chloride: 106 mmol/L (ref 98–111)
Creatinine, Ser: 0.68 mg/dL (ref 0.61–1.24)
GFR, Estimated: >60 mL/min (ref >60)
Glucose, Bld: 99 mg/dL (ref 70–99)
Potassium: 3.8 mmol/L (ref 3.5–5.1)
Sodium: 141 mmol/L (ref 135–145)
Total Bilirubin: 0.5 mg/dL (ref 0.3–1.2)
Total Protein: 6.9 g/dL (ref 6.5–8.1)

## 2021-02-24 LAB — ETHANOL: Alcohol, Ethyl (B): 253 mg/dL — ABNORMAL HIGH (ref <10)

## 2021-02-24 LAB — CBC
HCT: 37 % — ABNORMAL LOW (ref 39.0–52.0)
Hemoglobin: 12.3 g/dL — ABNORMAL LOW (ref 13.0–17.0)
MCH: 33.2 pg (ref 26.0–34.0)
MCHC: 33.2 g/dL (ref 30.0–36.0)
MCV: 99.7 fL (ref 80.0–100.0)
Platelets: 148 10*3/uL — ABNORMAL LOW (ref 150–400)
RBC: 3.71 MIL/uL — ABNORMAL LOW (ref 4.22–5.81)
RDW: 15.1 % (ref 11.5–15.5)
WBC: 4.3 10*3/uL (ref 4.0–10.5)
nRBC: 0 % (ref 0.0–0.2)

## 2021-02-24 IMAGING — DX DG FOOT COMPLETE 3+V*R*
3 series · 3 of 3 positions shown · non-contrast
Comparison: None.

CLINICAL DATA: Intoxication

EXAM:
RIGHT FOOT COMPLETE - 3+ VIEW

[foot ap]
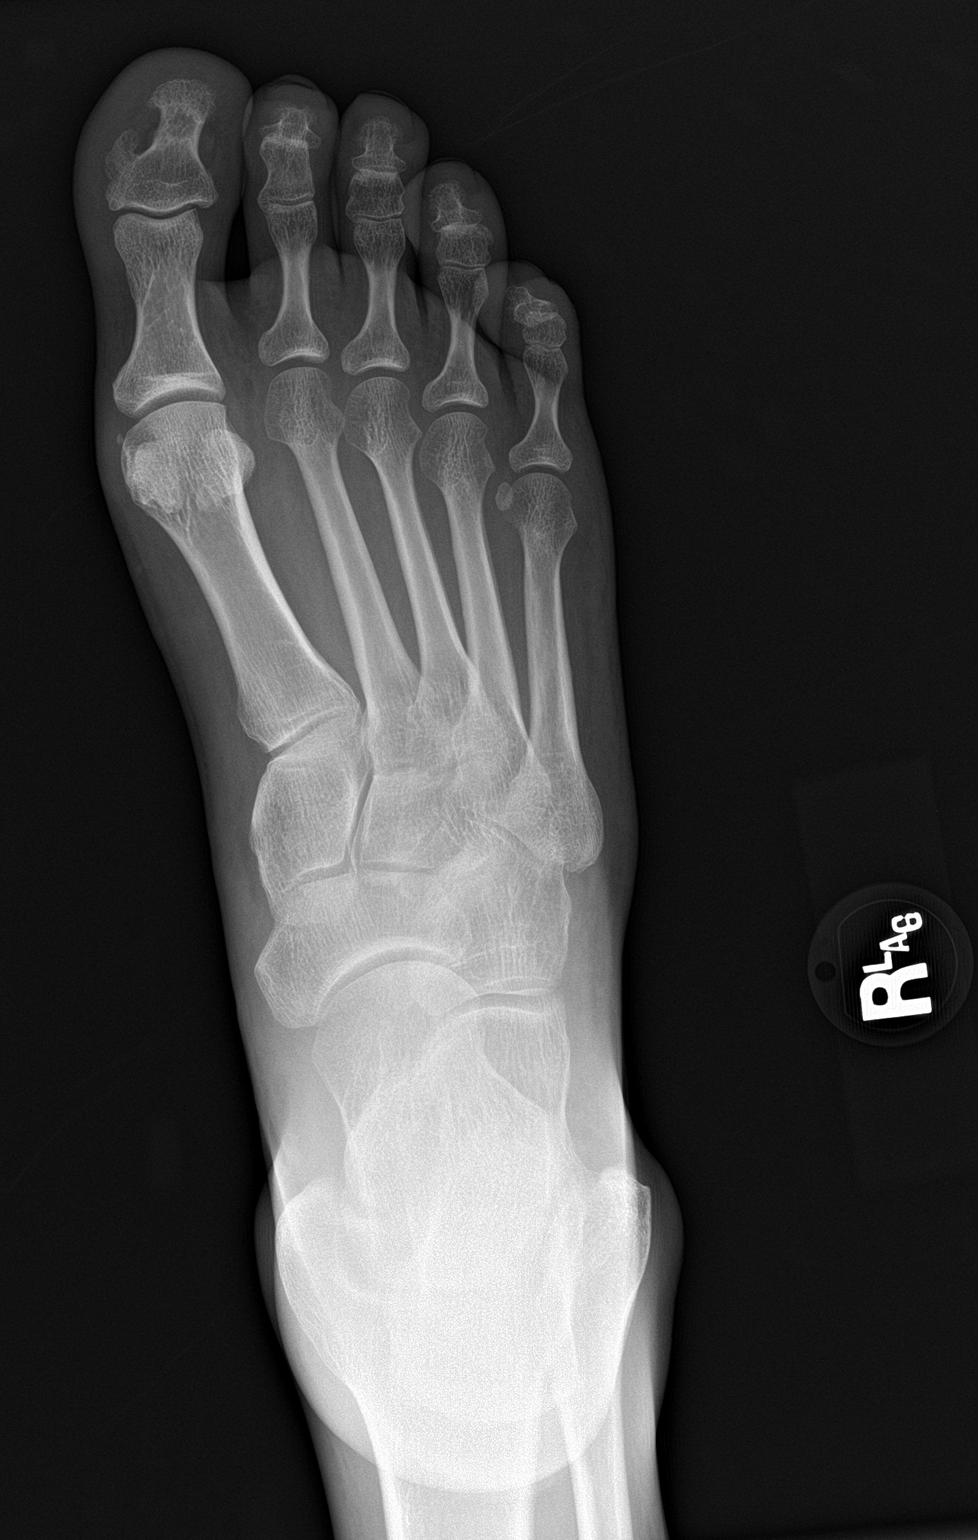

[foot obl]
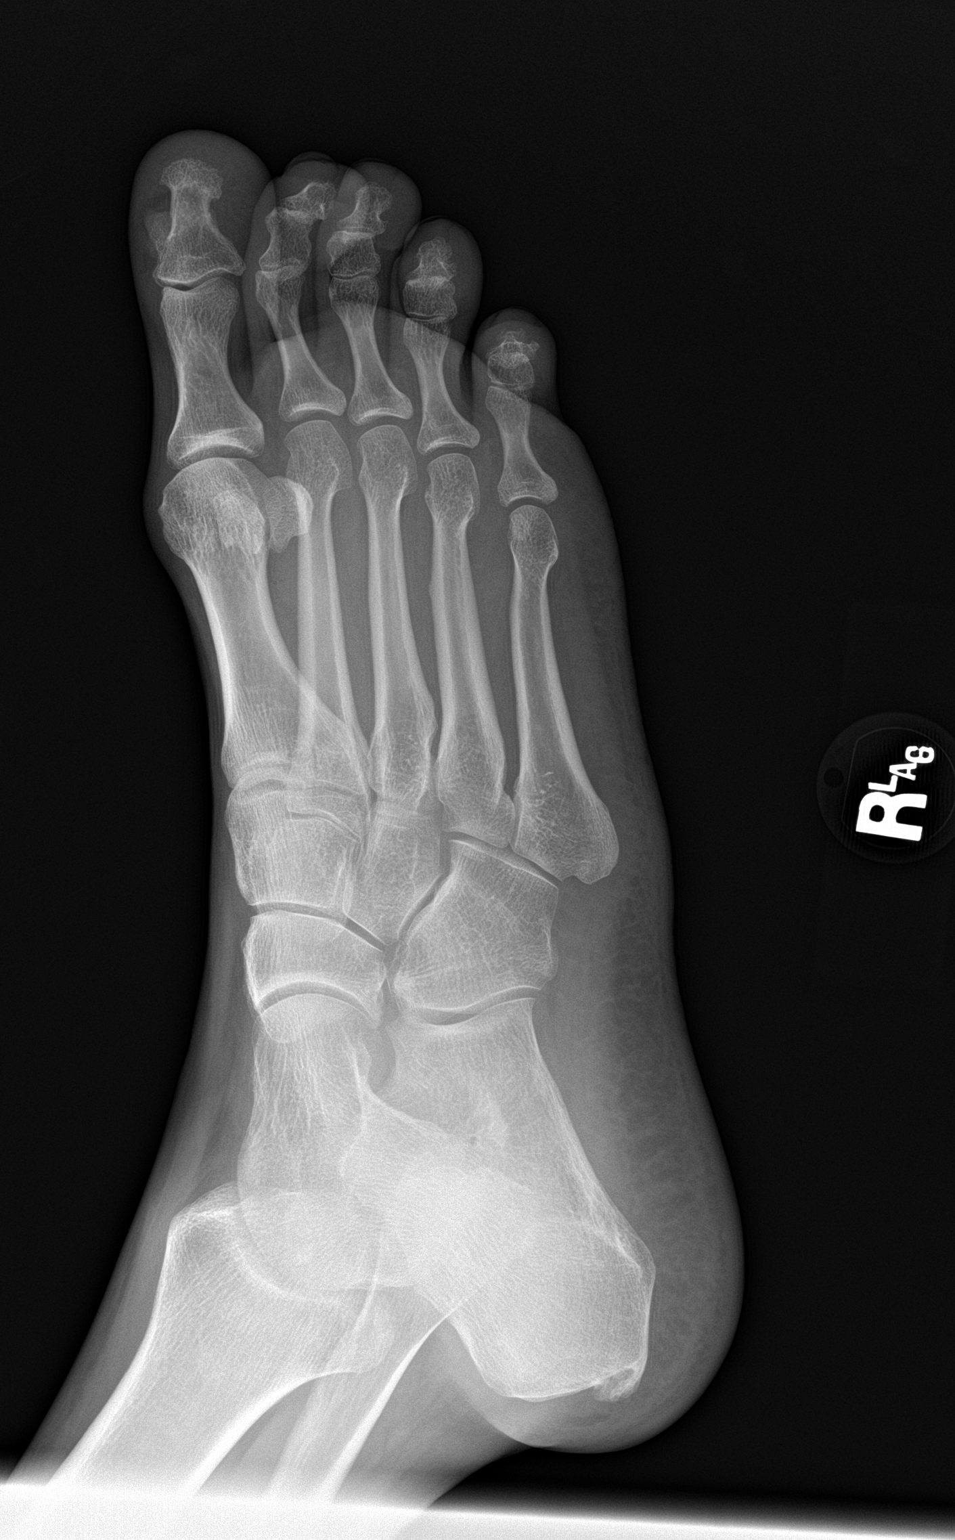

[foot lat]
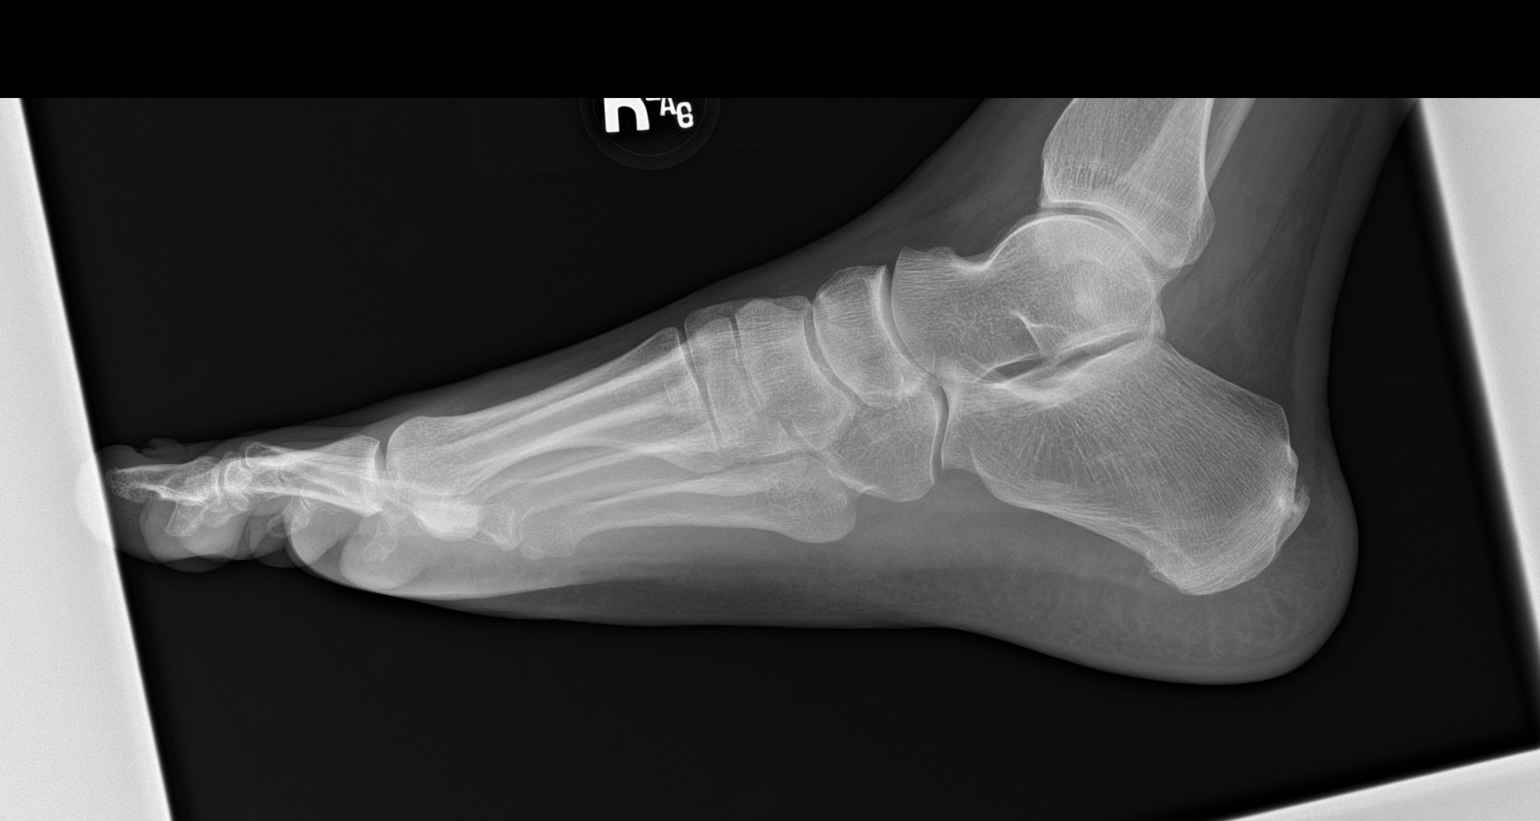

[3 of 3 positions shown; findings below may reference images not displayed]

FINDINGS: Focal soft tissue swelling about the posteroinferior calcaneus with
a conspicuous band of ill-defined sclerosis obliquely traversing the
trabecular lines of the calcaneus which could reflect a nondisplaced
fracture. Bidirectional calcaneal spurring is noted as well. No
other acute or concerning osseous lesions are seen in the foot.
Minimal arthrosis. Benign-appearing ossicle versus osteophyte along
the medial aspect of the first metatarsophalangeal joint.
IMPRESSION: Focal soft tissue swelling about the posteroinferior calcaneus with
a conspicuous band of ill-defined sclerosis obliquely traversing the
trabecular lines of the calcaneus which could reflect a nondisplaced
fracture.

## 2021-02-24 MED ORDER — LORAZEPAM 2 MG/ML IJ SOLN: 0.0000 mg | Freq: Two times a day (BID) | INTRAMUSCULAR | Status: DC

## 2021-02-24 MED ORDER — LORAZEPAM 1 MG PO TABS
0.0000 mg | ORAL_TABLET | Freq: Two times a day (BID) | ORAL | Status: DC
Start: 2021-02-26 — End: 2021-02-24

## 2021-02-24 MED ORDER — CHLORDIAZEPOXIDE HCL 25 MG PO CAPS: ORAL_CAPSULE | ORAL | 0 refills | Status: DC

## 2021-02-24 MED ORDER — THIAMINE HCL 100 MG/ML IJ SOLN: 100.0000 mg | Freq: Every day | INTRAMUSCULAR | Status: DC

## 2021-02-24 MED ORDER — THIAMINE HCL 100 MG PO TABS: 100.0000 mg | ORAL_TABLET | Freq: Every day | ORAL | Status: DC

## 2021-02-24 MED ORDER — SODIUM CHLORIDE 0.9 % IV BOLUS
1000.0000 mL | Freq: Once | INTRAVENOUS | Status: AC
  Administered 2021-02-24: 1000 mL via INTRAVENOUS

## 2021-02-24 MED ORDER — LORAZEPAM 1 MG PO TABS: 0.0000 mg | ORAL_TABLET | Freq: Four times a day (QID) | ORAL | Status: DC

## 2021-02-24 MED ORDER — LORAZEPAM 2 MG/ML IJ SOLN
1.0000 mg | Freq: Once | INTRAMUSCULAR | Status: AC
  Administered 2021-02-24: 1 mg via INTRAVENOUS
  Filled 2021-02-24: qty 1

## 2021-02-24 MED ORDER — LORAZEPAM 2 MG/ML IJ SOLN
0.0000 mg | Freq: Four times a day (QID) | INTRAMUSCULAR | Status: DC
  Administered 2021-02-24: 1 mg via INTRAVENOUS
  Filled 2021-02-24: qty 1

## 2021-02-24 NOTE — ED Notes (Signed)
Patient  A&Ox4 cooperative and calm. Pt. Ambulated to the bathroom and back to his room without assistance. Pt was able to keep a sandwich and water down without vomiting. Discharged AVS reviewed.

## 2021-02-24 NOTE — ED Provider Notes (Signed)
[facility]-EMERGENCY DEPT Provider Note   CSN: [id number] Arrival date & time: 02/23/21  2325     History Chief Complaint  Patient presents with  . Alcohol Intoxication    Gary [name] is a 35 y.o. male with a hx of HIV, EtOH abuse, and pancreatitis who presents to the ED from [facility] due to alcohol intoxication. Patient initially not answering many questions, admits to EtOH use tonight, level 5 caveat applies secondary to ETOH intoxication.   On chart review for additional hx was brought to [facility] by police, reportedly there was a warrant for his arrest and he was brought to [facility] for possible detox. He denied SI there. Due to his hx of DTs/withdrawal seizures sent to the ED.    HPI     Past Medical History:  Diagnosis Date  . Alcohol abuse   . HIV (human immunodeficiency virus infection) (HCC)   . Subdural hematoma [facility])     Patient Active Problem List   Diagnosis Date Noted  . 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 Elevated troponin 10/19/2020  . Alcohol withdrawal (HCC) 10/19/2020  . Alcohol abuse   . Suicidal ideation 06/14/2020  . Pancreatitis 06/13/2020  . 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  . Malnutrition of moderate degree 05/07/2020  . Gastrointestinal hemorrhage   . Alcoholic hepatitis without ascites   . Melena 05/05/2020  . Alcoholic ketoacidosis 05/05/2020  . Thrombocytopenia (HCC) 07/25/2019  . Transaminitis 07/25/2019  . Traumatic subdural hematoma (HCC) 07/02/2019  . Alcohol abuse with intoxication (HCC) 07/02/2019  . 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 Alcohol use disorder, severe, dependence (HCC) 04/16/2019  . 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 HIV disease (HCC) 06/16/2018  . Polysubstance abuse (HCC) 06/16/2018  . Cigarette smoker 06/16/2018    Past Surgical History:  Procedure Laterality Date  . INCISION AND DRAINAGE PERIRECTAL ABSCESS  N/A 09/24/2016   Procedure: IRRIGATION AND DEBRIDEMENT PERIRECTAL ABSCESS;  Surgeon: Ricarda Frame, MD;  Location: [facility] ORS;  Service: General;  Laterality: N/A;  . none         Family History  Problem Relation Age of Onset  . Alcohol abuse Mother   . Alcohol abuse Father     Social History   Tobacco Use  . Smoking status: Former Smoker    Packs/day: 0.50    Types: Cigarettes  . Smokeless tobacco: Never Used  . Tobacco comment: unable to smoke while incarcerated 6+ months 02/13/20  Vaping Use  . Vaping Use: Never used  Substance Use Topics  . Alcohol use: Yes    Comment: "I drink all I can"  . Drug use: Not Currently    Types: Marijuana, Methamphetamines    Comment: last used 04/10/2019    Home Medications Prior to Admission medications   Medication Sig Start Date End Date Taking? Authorizing Provider  chlordiazePOXIDE (LIBRIUM) 25 MG capsule 50mg  PO TID x 1D, then 25-50mg  PO BID X 1D, then 25-50mg  PO QD X 1D 02/19/21   02/21/21, MD  elvitegravir-cobicistat-emtricitabine-tenofovir (GENVOYA) 150-150-200-10 MG TABS tablet Take 1 tablet by mouth daily with breakfast. 09/28/20   Clapacs, 09/30/20, MD  famotidine (PEPCID) 20 MG tablet Take 1 tablet (20 mg total) by mouth 2 (two) times daily. Patient not taking: Reported on 06/01/2019 05/14/19 06/02/19  06/04/19, MD    Allergies    Tegretol [carbamazepine] and Caffeine  Review of Systems   Review of Systems  Unable to perform  ROS: Other (Intoxication)    Physical Exam Updated Vital Signs BP 107/72   Pulse 77   Temp 98.6 F (37 C) (Oral)   Resp 18   SpO2 90%   Physical Exam Vitals and nursing note reviewed.  Constitutional:      General: He is not in acute distress.    Appearance: He is well-developed. He is not toxic-appearing.  HENT:     Head: Normocephalic and atraumatic.  Eyes:     General:        Right eye: No discharge.        Left eye: No discharge.     Conjunctiva/sclera: Conjunctivae normal.   Cardiovascular:     Rate and Rhythm: Normal rate and regular rhythm.  Pulmonary:     Effort: Pulmonary effort is normal. No respiratory distress.     Breath sounds: Normal breath sounds. No wheezing, rhonchi or rales.  Abdominal:     General: There is no distension.     Palpations: Abdomen is soft.     Tenderness: There is no abdominal tenderness. There is no guarding or rebound.  Musculoskeletal:     Cervical back: Neck supple.  Skin:    General: Skin is warm and dry.     Findings: No rash.  Neurological:     Comments: Speech mildly slurred. Moving all extremities.   Psychiatric:        Behavior: Behavior normal.     ED Results / Procedures / Treatments   Labs (all labs ordered are listed, but only abnormal results are displayed) Labs Reviewed  ETHANOL - Abnormal; Notable for the following components:      Result Value   Alcohol, Ethyl (B) 253 (*)    All other components within normal limits  COMPREHENSIVE METABOLIC PANEL - Abnormal; Notable for the following components:   Calcium 8.6 (*)    AST 47 (*)    All other components within normal limits  CBC    EKG None  Radiology DG Foot Complete Right  Result Date: 02/24/2021 CLINICAL DATA:  Intoxication EXAM: RIGHT FOOT COMPLETE - 3+ VIEW COMPARISON:  None. FINDINGS: Focal soft tissue swelling about the posteroinferior calcaneus with a conspicuous band of ill-defined sclerosis obliquely traversing the trabecular lines of the calcaneus which could reflect a nondisplaced fracture. Bidirectional calcaneal spurring is noted as well. No other acute or concerning osseous lesions are seen in the foot. Minimal arthrosis. Benign-appearing ossicle versus osteophyte along the medial aspect of the first metatarsophalangeal joint. IMPRESSION: Focal soft tissue swelling about the posteroinferior calcaneus with a conspicuous band of ill-defined sclerosis obliquely traversing the trabecular lines of the calcaneus which could reflect a  nondisplaced fracture. Electronically Signed   By: Kreg Shropshire M.D.   On: 02/24/2021 03:27    Procedures Procedures   Medications Ordered in ED Medications  LORazepam (ATIVAN) injection 0-4 mg (1 mg Intravenous Given 02/24/21 0237)    Or  LORazepam (ATIVAN) tablet 0-4 mg ( Oral See Alternative 02/24/21 0237)  LORazepam (ATIVAN) injection 0-4 mg (has no administration in time range)    Or  LORazepam (ATIVAN) tablet 0-4 mg (has no administration in time range)  thiamine tablet 100 mg (has no administration in time range)    Or  thiamine (B-1) injection 100 mg (has no administration in time range)  sodium chloride 0.9 % bolus 1,000 mL (1,000 mLs Intravenous New Bag/Given 02/24/21 0236)    ED Course  I have reviewed the triage vital signs and the nursing  notes.  Pertinent labs & imaging results that were available during my care of the patient were reviewed by me and considered in my medical decision making (see chart for details).    MDM Rules/Calculators/A&P                          Patient presents to the ED for evaluation of alcohol intoxication.  Nontoxic, vitals without significant abnormality. Appears clinically intoxicated on arrival   Additional history obtained:  Additional history obtained from chart review & nursing note review.   Lab Tests:  I Ordered, reviewed, and interpreted labs, which included:  Ethanol level elevated @ 253 CMP: Fairy unremrakable CBC: similar to prior.   ED Course:  CIWA per nursing 8, given 1 mg of IV ativan, patient tolerating PO, more awake on re-assessment complaining of right foot pain. Right foot with dried skin with some callouses noted to plantar aspect. No erythematous/purulent wounds. Able to move all digits and the ankle without difficulty, diffusely tender to plantar aspect of the digits and forefoot. 2+ DP pulses. 5/5 strength with plantar/dorsiflexion, x-ray obtained- personally reviewed, radiologist impression discussing findings  that could reflect a nondisplaced calcaneus fracture- patient denies trauma, he has no calcaneal tenderness on exam and is able to weightbear- discussed w/ attending- unlikely fracture. Patient states he never takes his shoes off and does a lot of walking likely contributing. No signs of infection. No calf tenderness/edema. NVI distally. Given new socks, told to take shoes off and elevated feet at times. From an EtOH intoxication standpoint able to metabolize in the ED, received some ativan, findings not consistent w/ DTs, tolerating PO, ambulatory, no SI/HI/hallucinations, appears fairly baseline for patient well known to the ED and appropriate for discharge. Librium taper sent, however has not previously picked these up when prescribed. Outpatient resources provided. Peer support consult placed. I discussed results, treatment plan, need for follow-up, and return precautions with the patient. Provided opportunity for questions, patient confirmed understanding and is in agreement with plan.   Findings and plan of care discussed with supervising physician Dr. Read Drivers who is in agreement.  [place] Controlled Substance reporting System queried  Portions of this note were generated with Scientist, clinical (histocompatibility and immunogenetics). Dictation errors may occur despite best attempts at proofreading.  Final Clinical Impression(s) / ED Diagnoses Final diagnoses:  Alcoholic intoxication without complication (HCC)    Rx / DC Orders ED Discharge Orders         Ordered    chlordiazePOXIDE (LIBRIUM) 25 MG capsule        02/24/21 0525           Cherly Anderson, PA-C 02/24/21 0546    Paula Libra, MD 02/24/21 [phone]

## 2021-02-24 NOTE — Discharge Instructions (Signed)
You were seen in the emergency department today for alcohol intoxication.  Your blood work looks similar to prior labs with your anemia and your low platelet counts.  Please follow-up with your primary care provider for this.  We sent in a Librium taper for you to take if you wish to stop drinking alcohol.  Do not drink alcohol and take Librium taper at the same time.  Please see attached resources.  Return to the ER for any new or worsening symptoms or any other concerns.

## 2021-02-25 ENCOUNTER — Emergency Department (HOSPITAL_COMMUNITY)
Admission: EM | Admit: 2021-02-25 | Discharge: 2021-02-26 | Disposition: A | Discharge status: Home / Self Care | Payer: Self-pay | Attending: Emergency Medicine | Admitting: Emergency Medicine

## 2021-02-25 ENCOUNTER — Encounter (HOSPITAL_COMMUNITY): Payer: Self-pay | Admitting: Emergency Medicine

## 2021-02-25 DIAGNOSIS — R112 Nausea with vomiting, unspecified: Secondary | ICD-10-CM | POA: Insufficient documentation

## 2021-02-25 DIAGNOSIS — Z87891 Personal history of nicotine dependence: Secondary | ICD-10-CM | POA: Insufficient documentation

## 2021-02-25 DIAGNOSIS — F1010 Alcohol abuse, uncomplicated: Secondary | ICD-10-CM

## 2021-02-25 DIAGNOSIS — R251 Tremor, unspecified: Secondary | ICD-10-CM | POA: Insufficient documentation

## 2021-02-25 DIAGNOSIS — B20 Human immunodeficiency virus [HIV] disease: Secondary | ICD-10-CM | POA: Insufficient documentation

## 2021-02-25 LAB — BASIC METABOLIC PANEL
Anion gap: 13 (ref 5–15)
BUN: 7 mg/dL (ref 6–20)
CO2: 23 mmol/L (ref 22–32)
Calcium: 9.1 mg/dL (ref 8.9–10.3)
Chloride: 103 mmol/L (ref 98–111)
Creatinine, Ser: 0.64 mg/dL (ref 0.61–1.24)
GFR, Estimated: >60 mL/min (ref >60)
Glucose, Bld: 88 mg/dL (ref 70–99)
Potassium: 3.6 mmol/L (ref 3.5–5.1)
Sodium: 139 mmol/L (ref 135–145)

## 2021-02-25 LAB — CBC WITH DIFFERENTIAL/PLATELET
Abs Immature Granulocytes: 0.01 10*3/uL (ref 0.00–0.07)
Basophils Absolute: 0.1 10*3/uL (ref 0.0–0.1)
Basophils Relative: 1 %
Eosinophils Absolute: 0.2 10*3/uL (ref 0.0–0.5)
Eosinophils Relative: 4 %
HCT: 43 % (ref 39.0–52.0)
Hemoglobin: 14.6 g/dL (ref 13.0–17.0)
Immature Granulocytes: 0 %
Lymphocytes Relative: 43 %
Lymphs Abs: 2.1 10*3/uL (ref 0.7–4.0)
MCH: 33.5 pg (ref 26.0–34.0)
MCHC: 34 g/dL (ref 30.0–36.0)
MCV: 98.6 fL (ref 80.0–100.0)
Monocytes Absolute: 0.3 10*3/uL (ref 0.1–1.0)
Monocytes Relative: 6 %
Neutro Abs: 2.2 10*3/uL (ref 1.7–7.7)
Neutrophils Relative %: 46 %
Platelets: 159 10*3/uL (ref 150–400)
RBC: 4.36 MIL/uL (ref 4.22–5.81)
RDW: 14.9 % (ref 11.5–15.5)
WBC: 4.8 10*3/uL (ref 4.0–10.5)
nRBC: 0 % (ref 0.0–0.2)

## 2021-02-25 LAB — ETHANOL: Alcohol, Ethyl (B): 286 mg/dL — ABNORMAL HIGH (ref <10)

## 2021-02-25 MED ORDER — SODIUM CHLORIDE 0.9 % IV BOLUS
1000.0000 mL | Freq: Once | INTRAVENOUS | Status: AC
  Administered 2021-02-25: 1000 mL via INTRAVENOUS

## 2021-02-25 MED ORDER — KETOROLAC TROMETHAMINE 30 MG/ML IJ SOLN
30.0000 mg | Freq: Once | INTRAMUSCULAR | Status: AC
  Administered 2021-02-25: 30 mg via INTRAVENOUS
  Filled 2021-02-25: qty 1

## 2021-02-25 NOTE — ED Provider Notes (Signed)
[facility]-EMERGENCY DEPT Provider Note   CSN: [id number] Arrival date & time: 02/25/21  1719     History Chief Complaint  Patient presents with  . Alcohol Problem    Gary [name] is a 35 y.o. male.  Patient has a history of alcohol abuse.  He was kicked out of [place] for drinking.  Patient comes here complaining of palpitations and hurting all over  The history is provided by the patient and medical records. No language interpreter was used.  Alcohol Problem This is a chronic problem. The current episode started more than 1 week ago. The problem occurs constantly. The problem has not changed since onset.Pertinent negatives include no chest pain, no abdominal pain and no headaches. Nothing aggravates the symptoms. Nothing relieves the symptoms. He has tried nothing for the symptoms.       Past Medical History:  Diagnosis Date  . Alcohol abuse   . HIV (human immunodeficiency virus infection) (HCC)   . Subdural hematoma [facility])     Patient Active Problem List   Diagnosis Date Noted  . 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 Elevated troponin 10/19/2020  . Alcohol withdrawal (HCC) 10/19/2020  . Alcohol abuse   . Suicidal ideation 06/14/2020  . Pancreatitis 06/13/2020  . 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  . Malnutrition of moderate degree 05/07/2020  . Gastrointestinal hemorrhage   . Alcoholic hepatitis without ascites   . Melena 05/05/2020  . Alcoholic ketoacidosis 05/05/2020  . Thrombocytopenia (HCC) 07/25/2019  . Transaminitis 07/25/2019  . Traumatic subdural hematoma (HCC) 07/02/2019  . Alcohol abuse with intoxication (HCC) 07/02/2019  . 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 Alcohol use disorder, severe, dependence (HCC) 04/16/2019  . 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 HIV disease (HCC) 06/16/2018  . Polysubstance abuse (HCC) 06/16/2018  . Cigarette smoker  06/16/2018    Past Surgical History:  Procedure Laterality Date  . INCISION AND DRAINAGE PERIRECTAL ABSCESS N/A 09/24/2016   Procedure: IRRIGATION AND DEBRIDEMENT PERIRECTAL ABSCESS;  Surgeon: Ricarda Frame, MD;  Location: [facility] ORS;  Service: General;  Laterality: N/A;  . none         Family History  Problem Relation Age of Onset  . Alcohol abuse Mother   . Alcohol abuse Father     Social History   Tobacco Use  . Smoking status: Former Smoker    Packs/day: 0.50    Types: Cigarettes  . Smokeless tobacco: Never Used  . Tobacco comment: unable to smoke while incarcerated 6+ months 02/13/20  Vaping Use  . Vaping Use: Never used  Substance Use Topics  . Alcohol use: Yes    Comment: "I drink all I can"  . Drug use: Not Currently    Types: Marijuana, Methamphetamines    Comment: last used 04/10/2019    Home Medications Prior to Admission medications   Medication Sig Start Date End Date Taking? Authorizing Provider  chlordiazePOXIDE (LIBRIUM) 25 MG capsule 50mg  PO TID x 1D, then 25-50mg  PO BID X 1D, then 25-50mg  PO QD X 1D 02/24/21   Petrucelli, Samantha R, PA-C  elvitegravir-cobicistat-emtricitabine-tenofovir (GENVOYA) 150-150-200-10 MG TABS tablet Take 1 tablet by mouth daily with breakfast. 09/28/20   Clapacs, 09/30/20, MD  famotidine (PEPCID) 20 MG tablet Take 1 tablet (20 mg total) by mouth 2 (two) times daily. Patient not taking: Reported on 06/01/2019 05/14/19 06/02/19  06/04/19, MD    Allergies    Tegretol [carbamazepine] and Caffeine  Review  of Systems   Review of Systems  Constitutional: Positive for fatigue. Negative for appetite change.  HENT: Negative for congestion, ear discharge and sinus pressure.   Eyes: Negative for discharge.  Respiratory: Negative for cough.   Cardiovascular: Negative for chest pain.  Gastrointestinal: Negative for abdominal pain and diarrhea.  Genitourinary: Negative for frequency and hematuria.  Musculoskeletal: Positive for  myalgias. Negative for back pain.  Skin: Negative for rash.  Neurological: Negative for seizures and headaches.  Psychiatric/Behavioral: Negative for hallucinations.    Physical Exam Updated Vital Signs BP 123/90 (BP Location: Left Arm)   Pulse 89   Temp 98 F (36.7 C) (Oral)   Resp 16   SpO2 99%   Physical Exam Vitals and nursing note reviewed.  Constitutional:      Appearance: He is well-developed.  HENT:     Head: Normocephalic.     Nose: Nose normal.  Eyes:     General: No scleral icterus.    Conjunctiva/sclera: Conjunctivae normal.  Neck:     Thyroid: No thyromegaly.  Cardiovascular:     Rate and Rhythm: Normal rate and regular rhythm.     Heart sounds: No murmur heard. No friction rub. No gallop.   Pulmonary:     Breath sounds: No stridor. No wheezing or rales.  Chest:     Chest wall: No tenderness.  Abdominal:     General: There is no distension.     Tenderness: There is no abdominal tenderness. There is no rebound.  Musculoskeletal:        General: Normal range of motion.     Cervical back: Neck supple.  Lymphadenopathy:     Cervical: No cervical adenopathy.  Skin:    Findings: No erythema or rash.  Neurological:     Mental Status: He is alert and oriented to person, place, and time.     Motor: No abnormal muscle tone.     Coordination: Coordination normal.  Psychiatric:        Behavior: Behavior normal.     ED Results / Procedures / Treatments   Labs (all labs ordered are listed, but only abnormal results are displayed) Labs Reviewed  CBC WITH DIFFERENTIAL/PLATELET  BASIC METABOLIC PANEL  ETHANOL    EKG None  Radiology DG Foot Complete Right  Result Date: 02/24/2021 CLINICAL DATA:  Intoxication EXAM: RIGHT FOOT COMPLETE - 3+ VIEW COMPARISON:  None. FINDINGS: Focal soft tissue swelling about the posteroinferior calcaneus with a conspicuous band of ill-defined sclerosis obliquely traversing the trabecular lines of the calcaneus which could  reflect a nondisplaced fracture. Bidirectional calcaneal spurring is noted as well. No other acute or concerning osseous lesions are seen in the foot. Minimal arthrosis. Benign-appearing ossicle versus osteophyte along the medial aspect of the first metatarsophalangeal joint. IMPRESSION: Focal soft tissue swelling about the posteroinferior calcaneus with a conspicuous band of ill-defined sclerosis obliquely traversing the trabecular lines of the calcaneus which could reflect a nondisplaced fracture. Electronically Signed   By: Kreg Shropshire M.D.   On: 02/24/2021 03:27    Procedures Procedures   Medications Ordered in ED Medications  sodium chloride 0.9 % bolus 1,000 mL (has no administration in time range)    ED Course  I have reviewed the triage vital signs and the nursing notes.  Pertinent labs & imaging results that were available during my care of the patient were reviewed by me and considered in my medical decision making (see chart for details).    MDM Rules/Calculators/A&P  Patient with history of EtOH abuse.  Labs pending and disposition will be determined by Dr. Preston Fleeting Final Clinical Impression(s) / ED Diagnoses Final diagnoses:  None    Rx / DC Orders ED Discharge Orders    None       Bethann Berkshire, MD 03/04/21 1253

## 2021-02-25 NOTE — ED Triage Notes (Addendum)
Per EMS-states he want ETOH withdraw-last drink this am-got kicked out of [facility] for drinking

## 2021-02-25 NOTE — ED Provider Notes (Signed)
Care assumed from Dr. Estell Harpin, patient with alcohol abuse and palpitations pending laboratory work-up and IV fluids.  Labs are significant only for elevated ethyl alcohol level.  Patient is awake and alert but is now having nausea and vomiting.  He is given a dose of ondansetron.  Nausea is improved following ondansetron, but when attempting to ambulate the patient, he is feeling unsteady.  He will be given additional IV fluids.  Following fluids, patient continues to feel unsteady when attempted to ambulate.  He also had recurrence of nausea and vomiting and is given a dose of prochlorperazine.  Following this, he is able to take fluids without emesis, but still complains of being unsteady when attempting to ambulate them.  Case is signed out to Dr. Audley Hose.   Dione Booze, MD 02/26/21 [phone]

## 2021-02-26 MED ORDER — CHLORDIAZEPOXIDE HCL 25 MG PO CAPS
25.0000 mg | ORAL_CAPSULE | Freq: Once | ORAL | Status: AC
  Administered 2021-02-26: 25 mg via ORAL
  Filled 2021-02-26: qty 1

## 2021-02-26 MED ORDER — ONDANSETRON HCL 4 MG/2ML IJ SOLN
4.0000 mg | Freq: Once | INTRAMUSCULAR | Status: AC
  Administered 2021-02-26: 4 mg via INTRAVENOUS
  Filled 2021-02-26: qty 2

## 2021-02-26 MED ORDER — CHLORDIAZEPOXIDE HCL 25 MG PO CAPS: ORAL_CAPSULE | ORAL | 0 refills | Status: DC

## 2021-02-26 MED ORDER — PROCHLORPERAZINE EDISYLATE 10 MG/2ML IJ SOLN
10.0000 mg | Freq: Once | INTRAMUSCULAR | Status: AC
  Administered 2021-02-26: 10 mg via INTRAVENOUS
  Filled 2021-02-26: qty 2

## 2021-02-26 MED ORDER — LACTATED RINGERS IV BOLUS
1000.0000 mL | Freq: Once | INTRAVENOUS | Status: AC
  Administered 2021-02-26: 1000 mL via INTRAVENOUS

## 2021-02-26 MED ORDER — LORAZEPAM 1 MG PO TABS
2.0000 mg | ORAL_TABLET | Freq: Once | ORAL | Status: AC
  Administered 2021-02-26: 2 mg via ORAL
  Filled 2021-02-26: qty 2

## 2021-02-26 NOTE — ED Notes (Addendum)
RN attempted 2nd trial ambulation. Pt still endorses dizziness. He had unsteady gait for 3 steps and had to return to bed. Preston Fleeting, MD notified

## 2021-02-26 NOTE — ED Notes (Signed)
Ambulated pt and pt was able to ambulate okay and reported feeling "much better".

## 2021-02-26 NOTE — ED Notes (Signed)
Attempted to ambulate pt. Pt was very unsteady on feet with visible tremors.

## 2021-02-26 NOTE — ED Notes (Signed)
RN attempted to ambulate pt. Pt stated he was dizzy while standing and was unable to ambulate at this time. Preston Fleeting, MD notified via secure chat.

## 2021-02-26 NOTE — ED Notes (Signed)
Discharge instructions reviewed with pt. Pt verbalized understanding the importance of refraining from alcohol while taking the prescription Librium.  Pt able to ambulate with steady gate.

## 2021-02-26 NOTE — ED Notes (Signed)
Patient is resting comfortably. 

## 2021-02-26 NOTE — ED Notes (Signed)
Attempted to ambulate this patient several times, he continues to complain of dizziness and tremors.  Unable to tell if patient is truly has tremors or just making it appear he is.  Spoke with Dr. Audley Hose about patient and received new orders.

## 2021-02-26 NOTE — ED Notes (Signed)
Bed alarm in place and activated due to unsteady gait at this time. Pt verbalized understanding of using the call bell and not getting up independently.

## 2021-02-26 NOTE — ED Notes (Signed)
ED Provider at bedside. 

## 2021-02-26 NOTE — Discharge Instructions (Addendum)
Call your primary care doctor or specialist as discussed in the next 2-3 days.   Return immediately back to the ER if:  Your symptoms worsen within the next 12-24 hours. You develop new symptoms such as new fevers, persistent vomiting, new pain, shortness of breath, or new weakness or numbness, or if you have any other concerns.  

## 2021-02-26 NOTE — ED Provider Notes (Signed)
Patient appears sobered at this point communicating normally.  Gait appears improved.  Given Ativan and Librium as he is starting to have symptoms of withdrawal and generalized shakiness.  Advised follow-up with his doctor within the week.  Advised return for worsening symptoms or any additional concerns.     Cheryll Cockayne, MD 02/26/21 [phone]

## 2021-02-26 NOTE — ED Notes (Signed)
This writer attempted to ambulate the Pt. Pt states that he is dizzy upon sitting up in the bed.

## 2021-02-28 ENCOUNTER — Emergency Department (HOSPITAL_COMMUNITY)
Admission: EM | Admit: 2021-02-28 | Discharge: 2021-02-28 | Disposition: A | Discharge status: Home / Self Care | Payer: Self-pay | Attending: Emergency Medicine | Admitting: Emergency Medicine

## 2021-02-28 DIAGNOSIS — Y908 Blood alcohol level of 240 mg/100 ml or more: Secondary | ICD-10-CM | POA: Insufficient documentation

## 2021-02-28 DIAGNOSIS — F10120 Alcohol abuse with intoxication, uncomplicated: Secondary | ICD-10-CM | POA: Insufficient documentation

## 2021-02-28 DIAGNOSIS — Z87891 Personal history of nicotine dependence: Secondary | ICD-10-CM | POA: Insufficient documentation

## 2021-02-28 DIAGNOSIS — F10920 Alcohol use, unspecified with intoxication, uncomplicated: Secondary | ICD-10-CM

## 2021-02-28 DIAGNOSIS — Z21 Asymptomatic human immunodeficiency virus [HIV] infection status: Secondary | ICD-10-CM | POA: Insufficient documentation

## 2021-02-28 LAB — CBC WITH DIFFERENTIAL/PLATELET
Abs Immature Granulocytes: 0.01 10*3/uL (ref 0.00–0.07)
Basophils Absolute: 0.1 10*3/uL (ref 0.0–0.1)
Basophils Relative: 1 %
Eosinophils Absolute: 0.1 10*3/uL (ref 0.0–0.5)
Eosinophils Relative: 2 %
HCT: 40.7 % (ref 39.0–52.0)
Hemoglobin: 13.9 g/dL (ref 13.0–17.0)
Immature Granulocytes: 0 %
Lymphocytes Relative: 30 %
Lymphs Abs: 1.4 10*3/uL (ref 0.7–4.0)
MCH: 33.5 pg (ref 26.0–34.0)
MCHC: 34.2 g/dL (ref 30.0–36.0)
MCV: 98.1 fL (ref 80.0–100.0)
Monocytes Absolute: 0.4 10*3/uL (ref 0.1–1.0)
Monocytes Relative: 7 %
Neutro Abs: 2.9 10*3/uL (ref 1.7–7.7)
Neutrophils Relative %: 60 %
Platelets: 127 10*3/uL — ABNORMAL LOW (ref 150–400)
RBC: 4.15 MIL/uL — ABNORMAL LOW (ref 4.22–5.81)
RDW: 14.8 % (ref 11.5–15.5)
WBC: 4.7 10*3/uL (ref 4.0–10.5)
nRBC: 0 % (ref 0.0–0.2)

## 2021-02-28 LAB — COMPREHENSIVE METABOLIC PANEL
ALT: 55 U/L — ABNORMAL HIGH (ref 0–44)
AST: 91 U/L — ABNORMAL HIGH (ref 15–41)
Albumin: 4 g/dL (ref 3.5–5.0)
Alkaline Phosphatase: 43 U/L (ref 38–126)
Anion gap: 12 (ref 5–15)
BUN: 5 mg/dL — ABNORMAL LOW (ref 6–20)
CO2: 24 mmol/L (ref 22–32)
Calcium: 8.7 mg/dL — ABNORMAL LOW (ref 8.9–10.3)
Chloride: 103 mmol/L (ref 98–111)
Creatinine, Ser: 0.87 mg/dL (ref 0.61–1.24)
GFR, Estimated: >60 mL/min (ref >60)
Glucose, Bld: 106 mg/dL — ABNORMAL HIGH (ref 70–99)
Potassium: 3.8 mmol/L (ref 3.5–5.1)
Sodium: 139 mmol/L (ref 135–145)
Total Bilirubin: 0.5 mg/dL (ref 0.3–1.2)
Total Protein: 7 g/dL (ref 6.5–8.1)

## 2021-02-28 LAB — LIPASE, BLOOD: Lipase: 44 U/L (ref 11–51)

## 2021-02-28 LAB — ETHANOL: Alcohol, Ethyl (B): 358 mg/dL (ref <10)

## 2021-02-28 MED ORDER — ONDANSETRON HCL 4 MG/2ML IJ SOLN
4.0000 mg | Freq: Once | INTRAMUSCULAR | Status: AC
  Administered 2021-02-28: 4 mg via INTRAVENOUS
  Filled 2021-02-28: qty 2

## 2021-02-28 MED ORDER — LACTATED RINGERS IV BOLUS
1000.0000 mL | Freq: Once | INTRAVENOUS | Status: AC
  Administered 2021-02-28: 1000 mL via INTRAVENOUS

## 2021-02-28 NOTE — ED Notes (Signed)
Pt asked for IV to be removed. Is refusing all medical attention

## 2021-02-28 NOTE — ED Triage Notes (Signed)
Pt arrived via [facility] ambulatory, [facility]. EMS reports that pt called for transport requesting transport to ED for "alcohol detox." Pt denied any physical complaints with EMS and reports drinking 3 bottles of wine today.

## 2021-02-28 NOTE — ED Notes (Signed)
Pt upset at bedside reporting wanting to leave ama because hes hungry, pt provided [place] sandwhich calm and redirectable at this time.

## 2021-02-28 NOTE — Discharge Instructions (Signed)
1.  You have been given many resource guides for help with alcohol abuse and homelessness.  Use these to start finding additional care in the community.

## 2021-02-28 NOTE — ED Provider Notes (Signed)
[facility] EMERGENCY DEPARTMENT Provider Note   CSN: [id number] Arrival date & time: 02/28/21  1216     History Chief Complaint  Patient presents with  . Alcohol Problem    Gary [name] is a 34 y.o. male.  Patient is a 35 year old male with a history of HIV, subdural hemorrhage, recurrent alcohol abuse, homelessness who is presenting today with EMS.  Patient reports he wants to stop drinking but then states that he did not drink enough today to feel normal.  Every day he wakes up with the vomiting and has ongoing abdominal pain.  He states he drank 3 bottles of wine this morning but his drinking is more continuous.  Patient was seen in the emergency room for similar symptoms and discharged only 3 days ago.  He denies suicidal ideations.  He last had something to eat yesterday morning and does report that he is hungry.  He denies any hematemesis.  No chest pain or shortness of breath.  The history is provided by the patient and medical records.  Alcohol Problem       Past Medical History:  Diagnosis Date  . Alcohol abuse   . HIV (human immunodeficiency virus infection) (HCC)   . Subdural hematoma [facility])     Patient Active Problem List   Diagnosis Date Noted  . 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 Elevated troponin 10/19/2020  . Alcohol withdrawal (HCC) 10/19/2020  . Alcohol abuse   . Suicidal ideation 06/14/2020  . Pancreatitis 06/13/2020  . 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  . Malnutrition of moderate degree 05/07/2020  . Gastrointestinal hemorrhage   . Alcoholic hepatitis without ascites   . Melena 05/05/2020  . Alcoholic ketoacidosis 05/05/2020  . Thrombocytopenia (HCC) 07/25/2019  . Transaminitis 07/25/2019  . Traumatic subdural hematoma (HCC) 07/02/2019  . Alcohol abuse with intoxication (HCC) 07/02/2019  . 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 Alcohol use disorder, severe, dependence (HCC) 04/16/2019  . 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 HIV disease (HCC) 06/16/2018  . Polysubstance abuse (HCC) 06/16/2018  . Cigarette smoker 06/16/2018    Past Surgical History:  Procedure Laterality Date  . INCISION AND DRAINAGE PERIRECTAL ABSCESS N/A 09/24/2016   Procedure: IRRIGATION AND DEBRIDEMENT PERIRECTAL ABSCESS;  Surgeon: Ricarda Frame, MD;  Location: [facility] ORS;  Service: General;  Laterality: N/A;  . none         Family History  Problem Relation Age of Onset  . Alcohol abuse Mother   . Alcohol abuse Father     Social History   Tobacco Use  . Smoking status: Former Smoker    Packs/day: 0.50    Types: Cigarettes  . Smokeless tobacco: Never Used  . Tobacco comment: unable to smoke while incarcerated 6+ months 02/13/20  Vaping Use  . Vaping Use: Never used  Substance Use Topics  . Alcohol use: Yes    Comment: "I drink all I can"  . Drug use: Not Currently    Types: Marijuana, Methamphetamines    Comment: last used 04/10/2019    Home Medications Prior to Admission medications   Medication Sig Start Date End Date Taking? Authorizing Provider  chlordiazePOXIDE (LIBRIUM) 25 MG capsule 50mg  PO TID x 1D, then 25-50mg  PO BID X 1D, then 25-50mg  PO QD X 1D 02/26/21   02/28/21, MD  elvitegravir-cobicistat-emtricitabine-tenofovir (GENVOYA) 150-150-200-10 MG TABS tablet Take 1 tablet by mouth daily with breakfast. 09/28/20   Clapacs, 09/30/20, MD  famotidine (PEPCID) 20 MG tablet Take 1 tablet (20 mg total) by mouth 2 (two) times daily. Patient not taking: Reported on 06/01/2019 05/14/19 06/02/19  Benjiman Core, MD    Allergies    Tegretol [carbamazepine] and Caffeine  Review of Systems   Review of Systems  All other systems reviewed and are negative.   Physical Exam Updated Vital Signs BP 115/72 (BP Location: Right Arm)   Pulse 89   Temp 98.7 F (37.1 C) (Oral)   Resp 16   SpO2 94%   Physical Exam Vitals and nursing note  reviewed.  Constitutional:      General: He is not in acute distress.    Appearance: He is well-developed.     Comments: Disheveled  HENT:     Head: Normocephalic and atraumatic.  Eyes:     Conjunctiva/sclera: Conjunctivae normal.     Pupils: Pupils are equal, round, and reactive to light.  Cardiovascular:     Rate and Rhythm: Normal rate and regular rhythm.     Heart sounds: No murmur heard.   Pulmonary:     Effort: Pulmonary effort is normal. No respiratory distress.     Breath sounds: Normal breath sounds. No wheezing or rales.  Abdominal:     General: There is no distension.     Palpations: Abdomen is soft.     Tenderness: There is abdominal tenderness in the epigastric area, periumbilical area and left upper quadrant. There is guarding. There is no rebound.  Musculoskeletal:        General: No tenderness. Normal range of motion.     Cervical back: Normal range of motion and neck supple.  Skin:    General: Skin is warm and dry.     Findings: No erythema or rash.     Comments: Sunburn skin  Neurological:     Mental Status: He is alert and oriented to person, place, and time.     Sensory: No sensory deficit.     Motor: No weakness.  Psychiatric:        Mood and Affect: Affect is flat.        Speech: Speech is delayed.     ED Results / Procedures / Treatments   Labs (all labs ordered are listed, but only abnormal results are displayed) Labs Reviewed  CBC WITH DIFFERENTIAL/PLATELET - Abnormal; Notable for the following components:      Result Value   RBC 4.15 (*)    Platelets 127 (*)    All other components within normal limits  COMPREHENSIVE METABOLIC PANEL - Abnormal; Notable for the following components:   Glucose, Bld 106 (*)    BUN 5 (*)    Calcium 8.7 (*)    AST 91 (*)    ALT 55 (*)    All other components within normal limits  ETHANOL - Abnormal; Notable for the following components:   Alcohol, Ethyl (B) 358 (*)    All other components within normal  limits  LIPASE, BLOOD    EKG None  Radiology No results found.  Procedures Procedures   Medications Ordered in ED Medications  lactated ringers bolus 1,000 mL (has no administration in time range)  ondansetron (ZOFRAN) injection 4 mg (has no administration in time range)    ED Course  I have reviewed the triage vital signs and the nursing notes.  Pertinent labs & imaging results that were available during my care of the patient were reviewed by me and considered in my medical decision making (  see chart for details).    MDM Rules/Calculators/A&P                          Patient is a 35 year old male who is presenting today due to alcohol intoxication and stating he wants to go through detox.  However patient has drank wine this morning and appears intoxicated.  Theresia Lo is the third time patient has been seen in the last week for similar symptoms.  He does have a history of chronic alcohol abuse as well as pancreatitis.  He is complaining of upper abdominal pain and reports that he vomits every morning.  Also patient has poor lack of resources as he is currently homeless.  He denies any suicidal ideation at this time.  He is also requesting to eat.  Will check alcohol level, hydrate and give antiemetics.  No signs of trauma.  2:25 PM Patient's vital signs remained stable.  CBC, CMP are normal except for elevated LFTs which are alcohol related.  Lipase is within normal limits today.  Patient's alcohol level is almost 400 which would explain his somnolence.  Will have to allow patient to sober up pending any further evaluation.  MDM Number of Diagnoses or Management Options   Amount and/or Complexity of Data Reviewed Clinical lab tests: ordered and reviewed Independent visualization of images, tracings, or specimens: yes    Final Clinical Impression(s) / ED Diagnoses Final diagnoses:  Alcoholic intoxication without complication [facility])    Rx / DC Orders ED Discharge Orders     None       Gwyneth Sprout, MD 02/28/21 1426

## 2021-03-06 ENCOUNTER — Emergency Department (HOSPITAL_COMMUNITY)
Admission: EM | Admit: 2021-03-06 | Discharge: 2021-03-07 | Disposition: A | Discharge status: Home / Self Care | Payer: Self-pay | Attending: Emergency Medicine | Admitting: Emergency Medicine

## 2021-03-06 ENCOUNTER — Other Ambulatory Visit: Payer: Self-pay

## 2021-03-06 DIAGNOSIS — F10129 Alcohol abuse with intoxication, unspecified: Secondary | ICD-10-CM | POA: Insufficient documentation

## 2021-03-06 DIAGNOSIS — Z20822 Contact with and (suspected) exposure to covid-19: Secondary | ICD-10-CM | POA: Insufficient documentation

## 2021-03-06 DIAGNOSIS — F10920 Alcohol use, unspecified with intoxication, uncomplicated: Secondary | ICD-10-CM

## 2021-03-06 DIAGNOSIS — Y908 Blood alcohol level of 240 mg/100 ml or more: Secondary | ICD-10-CM | POA: Insufficient documentation

## 2021-03-06 DIAGNOSIS — Z87891 Personal history of nicotine dependence: Secondary | ICD-10-CM | POA: Insufficient documentation

## 2021-03-06 DIAGNOSIS — Z21 Asymptomatic human immunodeficiency virus [HIV] infection status: Secondary | ICD-10-CM | POA: Insufficient documentation

## 2021-03-06 LAB — COMPREHENSIVE METABOLIC PANEL
ALT: 62 U/L — ABNORMAL HIGH (ref 0–44)
AST: 88 U/L — ABNORMAL HIGH (ref 15–41)
Albumin: 4.6 g/dL (ref 3.5–5.0)
Alkaline Phosphatase: 50 U/L (ref 38–126)
Anion gap: 13 (ref 5–15)
BUN: 7 mg/dL (ref 6–20)
CO2: 24 mmol/L (ref 22–32)
Calcium: 9.1 mg/dL (ref 8.9–10.3)
Chloride: 105 mmol/L (ref 98–111)
Creatinine, Ser: 0.81 mg/dL (ref 0.61–1.24)
GFR, Estimated: >60 mL/min (ref >60)
Glucose, Bld: 93 mg/dL (ref 70–99)
Potassium: 3.7 mmol/L (ref 3.5–5.1)
Sodium: 142 mmol/L (ref 135–145)
Total Bilirubin: 0.6 mg/dL (ref 0.3–1.2)
Total Protein: 8.4 g/dL — ABNORMAL HIGH (ref 6.5–8.1)

## 2021-03-06 LAB — CBC WITH DIFFERENTIAL/PLATELET
Abs Immature Granulocytes: 0.01 10*3/uL (ref 0.00–0.07)
Basophils Absolute: 0.1 10*3/uL (ref 0.0–0.1)
Basophils Relative: 1 %
Eosinophils Absolute: 0.1 10*3/uL (ref 0.0–0.5)
Eosinophils Relative: 1 %
HCT: 43.1 % (ref 39.0–52.0)
Hemoglobin: 14.7 g/dL (ref 13.0–17.0)
Immature Granulocytes: 0 %
Lymphocytes Relative: 33 %
Lymphs Abs: 1.7 10*3/uL (ref 0.7–4.0)
MCH: 33.6 pg (ref 26.0–34.0)
MCHC: 34.1 g/dL (ref 30.0–36.0)
MCV: 98.4 fL (ref 80.0–100.0)
Monocytes Absolute: 0.3 10*3/uL (ref 0.1–1.0)
Monocytes Relative: 6 %
Neutro Abs: 2.9 10*3/uL (ref 1.7–7.7)
Neutrophils Relative %: 59 %
Platelets: 140 10*3/uL — ABNORMAL LOW (ref 150–400)
RBC: 4.38 MIL/uL (ref 4.22–5.81)
RDW: 15 % (ref 11.5–15.5)
WBC: 5.1 10*3/uL (ref 4.0–10.5)
nRBC: 0 % (ref 0.0–0.2)

## 2021-03-06 LAB — RESP PANEL BY RT-PCR (FLU A&B, COVID) ARPGX2
Influenza A by PCR: NEGATIVE
Influenza B by PCR: NEGATIVE
SARS Coronavirus 2 by RT PCR: NEGATIVE

## 2021-03-06 LAB — ETHANOL: Alcohol, Ethyl (B): 441 mg/dL (ref <10)

## 2021-03-06 LAB — SALICYLATE LEVEL: Salicylate Lvl: <7 mg/dL — ABNORMAL LOW (ref 7.0–30.0)

## 2021-03-06 LAB — CBG MONITORING, ED: Glucose-Capillary: 102 mg/dL — ABNORMAL HIGH (ref 70–99)

## 2021-03-06 LAB — ACETAMINOPHEN LEVEL: Acetaminophen (Tylenol), Serum: <10 ug/mL — ABNORMAL LOW (ref 10–30)

## 2021-03-06 MED ORDER — LORAZEPAM 2 MG/ML IJ SOLN: 1.0000 mg | Freq: Once | INTRAMUSCULAR | Status: DC

## 2021-03-06 MED ORDER — THIAMINE HCL 100 MG/ML IJ SOLN
100.0000 mg | Freq: Once | INTRAMUSCULAR | Status: AC
  Administered 2021-03-06: 100 mg via INTRAVENOUS
  Filled 2021-03-06: qty 2

## 2021-03-06 MED ORDER — SODIUM CHLORIDE 0.9 % IV BOLUS
500.0000 mL | Freq: Once | INTRAVENOUS | Status: AC
  Administered 2021-03-06: 500 mL via INTRAVENOUS

## 2021-03-06 MED ORDER — THIAMINE HCL 100 MG/ML IJ SOLN: 100.0000 mg | Freq: Every day | INTRAMUSCULAR | Status: DC

## 2021-03-06 MED ORDER — FOLIC ACID 1 MG PO TABS
1.0000 mg | ORAL_TABLET | Freq: Every day | ORAL | Status: DC
  Administered 2021-03-06: 1 mg via ORAL
  Filled 2021-03-06: qty 1

## 2021-03-06 MED ORDER — THIAMINE HCL 100 MG PO TABS
100.0000 mg | ORAL_TABLET | Freq: Every day | ORAL | Status: DC
  Administered 2021-03-06: 100 mg via ORAL
  Filled 2021-03-06: qty 1

## 2021-03-06 MED ORDER — LORAZEPAM 2 MG/ML IJ SOLN: 0.0000 mg | Freq: Four times a day (QID) | INTRAMUSCULAR | Status: DC

## 2021-03-06 MED ORDER — LORAZEPAM 1 MG PO TABS
0.0000 mg | ORAL_TABLET | Freq: Four times a day (QID) | ORAL | Status: DC
  Administered 2021-03-06: 2 mg via ORAL
  Filled 2021-03-06: qty 2

## 2021-03-06 MED ORDER — LORAZEPAM 1 MG PO TABS: 0.0000 mg | ORAL_TABLET | Freq: Two times a day (BID) | ORAL | Status: DC

## 2021-03-06 MED ORDER — LORAZEPAM 2 MG/ML IJ SOLN: 0.0000 mg | Freq: Two times a day (BID) | INTRAMUSCULAR | Status: DC

## 2021-03-06 NOTE — ED Provider Notes (Addendum)
23:45: Assumed care of patient from PA Sponsellar @ change of shift pending re-assessment & disposition.   Please see provider note for full H&P.  Briefly patient is a 35 year old male who presented to the emergency department for alcohol intoxication.   Physical Exam  BP 135/82   Pulse 94   Temp 98.2 F (36.8 C)   Resp 16   Ht 6\' 1"  (1.854 m)   Wt 70 kg   SpO2 97%   BMI 20.36 kg/m   Physical Exam Vitals and nursing note reviewed.  Constitutional:      General: He is not in acute distress.    Appearance: He is well-developed.  HENT:     Head: Normocephalic and atraumatic.  Eyes:     General:        Right eye: No discharge.        Left eye: No discharge.     Conjunctiva/sclera: Conjunctivae normal.  Cardiovascular:     Rate and Rhythm: Normal rate.     Comments: 2+ symmetric DP pulses bilaterally. Pulmonary:     Effort: Pulmonary effort is normal.  Abdominal:     General: There is no distension.     Palpations: Abdomen is soft.     Tenderness: There is no abdominal tenderness. There is no guarding or rebound.  Musculoskeletal:     Right lower leg: No edema.     Left lower leg: No edema.  Neurological:     Mental Status: He is alert.     Comments: Clear speech.   Psychiatric:        Behavior: Behavior normal.        Thought Content: Thought content normal.     ED Course/Procedures     Results for orders placed or performed during the hospital encounter of 03/06/21  Resp Panel by RT-PCR (Flu A&B, Covid) Nasopharyngeal Swab   Specimen: Nasopharyngeal Swab; Nasopharyngeal(NP) swabs in vial transport medium  Result Value Ref Range   SARS Coronavirus 2 by RT PCR NEGATIVE NEGATIVE   Influenza A by PCR NEGATIVE NEGATIVE   Influenza B by PCR NEGATIVE NEGATIVE  Comprehensive metabolic panel  Result Value Ref Range   Sodium 142 135 - 145 mmol/L   Potassium 3.7 3.5 - 5.1 mmol/L   Chloride 105 98 - 111 mmol/L   CO2 24 22 - 32 mmol/L   Glucose, Bld 93 70 - 99 mg/dL    BUN 7 6 - 20 mg/dL   Creatinine, Ser 03/08/21 0.61 - 1.24 mg/dL   Calcium 9.1 8.9 - 2.83 mg/dL   Total Protein 8.4 (H) 6.5 - 8.1 g/dL   Albumin 4.6 3.5 - 5.0 g/dL   AST 88 (H) 15 - 41 U/L   ALT 62 (H) 0 - 44 U/L   Alkaline Phosphatase 50 38 - 126 U/L   Total Bilirubin 0.6 0.3 - 1.2 mg/dL   GFR, Estimated 15.1 >76 mL/min   Anion gap 13 5 - 15  Ethanol  Result Value Ref Range   Alcohol, Ethyl (B) 441 (HH) <10 mg/dL  CBC with Diff  Result Value Ref Range   WBC 5.1 4.0 - 10.5 K/uL   RBC 4.38 4.22 - 5.81 MIL/uL   Hemoglobin 14.7 13.0 - 17.0 g/dL   HCT >16 07.3 - 71.0 %   MCV 98.4 80.0 - 100.0 fL   MCH 33.6 26.0 - 34.0 pg   MCHC 34.1 30.0 - 36.0 g/dL   RDW 62.6 94.8 - 54.6 %   Platelets  140 (L) 150 - 400 K/uL   nRBC 0.0 0.0 - 0.2 %   Neutrophils Relative % 59 %   Neutro Abs 2.9 1.7 - 7.7 K/uL   Lymphocytes Relative 33 %   Lymphs Abs 1.7 0.7 - 4.0 K/uL   Monocytes Relative 6 %   Monocytes Absolute 0.3 0.1 - 1.0 K/uL   Eosinophils Relative 1 %   Eosinophils Absolute 0.1 0.0 - 0.5 K/uL   Basophils Relative 1 %   Basophils Absolute 0.1 0.0 - 0.1 K/uL   Immature Granulocytes 0 %   Abs Immature Granulocytes 0.01 0.00 - 0.07 K/uL  Salicylate level  Result Value Ref Range   Salicylate Lvl <7.0 (L) 7.0 - 30.0 mg/dL  Acetaminophen level  Result Value Ref Range   Acetaminophen (Tylenol), Serum <10 (L) 10 - 30 ug/mL  CBG monitoring, ED  Result Value Ref Range   Glucose-Capillary 102 (H) 70 - 99 mg/dL   CT Head Wo Contrast  Result Date: 02/21/2021 CLINICAL DATA:  ETOH intoxication. Altered mental status. Dizziness. Feeling sick. EXAM: CT HEAD WITHOUT CONTRAST TECHNIQUE: Contiguous axial images were obtained from the base of the skull through the vertex without intravenous contrast. COMPARISON:  02/11/2021 FINDINGS: Brain: No evidence of acute infarction, hemorrhage, hydrocephalus, extra-axial collection or mass lesion/mass effect. Vascular: No hyperdense vessel or unexpected  calcification. Skull: Normal. Negative for fracture or focal lesion. Sinuses/Orbits: Globes and orbits are unremarkable. Sinuses are clear. Other: None. IMPRESSION: Negative unenhanced CT scan of the brain. No change from the prior study. Electronically Signed   By: Amie Portland M.D.   On: 02/21/2021 15:18   CT Head Wo Contrast  Result Date: 02/11/2021 CLINICAL DATA:  34 year old male with head trauma. EXAM: CT HEAD WITHOUT CONTRAST TECHNIQUE: Contiguous axial images were obtained from the base of the skull through the vertex without intravenous contrast. COMPARISON:  Head CT dated 01/18/2021. FINDINGS: Brain: No evidence of acute infarction, hemorrhage, hydrocephalus, extra-axial collection or mass lesion/mass effect. Vascular: No hyperdense vessel or unexpected calcification. Skull: Normal. Negative for fracture or focal lesion. Sinuses/Orbits: No acute finding. Other: None IMPRESSION: Normal noncontrast CT of the brain. Electronically Signed   By: Elgie Collard M.D.   On: 02/11/2021 22:34   DG Foot Complete Right  Result Date: 02/24/2021 CLINICAL DATA:  Intoxication EXAM: RIGHT FOOT COMPLETE - 3+ VIEW COMPARISON:  None. FINDINGS: Focal soft tissue swelling about the posteroinferior calcaneus with a conspicuous band of ill-defined sclerosis obliquely traversing the trabecular lines of the calcaneus which could reflect a nondisplaced fracture. Bidirectional calcaneal spurring is noted as well. No other acute or concerning osseous lesions are seen in the foot. Minimal arthrosis. Benign-appearing ossicle versus osteophyte along the medial aspect of the first metatarsophalangeal joint. IMPRESSION: Focal soft tissue swelling about the posteroinferior calcaneus with a conspicuous band of ill-defined sclerosis obliquely traversing the trabecular lines of the calcaneus which could reflect a nondisplaced fracture. Electronically Signed   By: Kreg Shropshire M.D.   On: 02/24/2021 03:27     Procedures  MDM     Ethanol level grossly elevated at 441, has been elevated previously, on CIWA protocol, and has received Ativan.  His transaminitis and thrombocytopenia are similar to prior labs on record.  00:30: RE-EVAL: Resting comfortably, no acute distress.  04:00: RE-EVAL: Distress comfortably, alert, awake, able to answer questions appropriately, ambulatory, and tolerating p.o. CIWA < 5.  He appears in his fairly typical state of health at this time as he is well-known to the  emergency department.  No SI. Has received multiple prescriptions for librium which he has not picked up therefore will not resend this evening. Overall appears appropriate for discharge home at this time.    Cherly Anderson, PA-C 03/07/21 0404    Cherly Anderson, PA-C 03/07/21 0405    Zadie Rhine, MD 03/07/21 [phone]

## 2021-03-06 NOTE — ED Notes (Signed)
Pt given turkey sandwich and soda. 

## 2021-03-06 NOTE — ED Provider Notes (Signed)
[facility]-EMERGENCY DEPT Provider Note   CSN: [id number] Arrival date & time: 03/06/21  1449     History Chief Complaint  Patient presents with  . Alcohol Intoxication    Gary [name] is a 35 y.o. male with history of HIV, subdural hemorrhage, alcohol abuse, and homelessness, who presents via EMS for alcohol intoxication.  Patient was found at [facility] following taking approximately 10 just about 1 hour prior to arrival.  Patient with extensive history of the same.  Patient with history of homelessness, HIV, presents to the ED multiple times for alcohol intoxication.   LEVEL 5 CAVEAT due to patient's inebriation.   HPI     Past Medical History:  Diagnosis Date  . Alcohol abuse   . HIV (human immunodeficiency virus infection) (HCC)   . Subdural hematoma [facility])     Patient Active Problem List   Diagnosis Date Noted  . 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 Elevated troponin 10/19/2020  . Alcohol withdrawal (HCC) 10/19/2020  . Alcohol abuse   . Suicidal ideation 06/14/2020  . Pancreatitis 06/13/2020  . 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  . Malnutrition of moderate degree 05/07/2020  . Gastrointestinal hemorrhage   . Alcoholic hepatitis without ascites   . Melena 05/05/2020  . Alcoholic ketoacidosis 05/05/2020  . Thrombocytopenia (HCC) 07/25/2019  . Transaminitis 07/25/2019  . Traumatic subdural hematoma (HCC) 07/02/2019  . Alcohol abuse with intoxication (HCC) 07/02/2019  . 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 Alcohol use disorder, severe, dependence (HCC) 04/16/2019  . 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 HIV disease (HCC) 06/16/2018  . Polysubstance abuse (HCC) 06/16/2018  . Cigarette smoker 06/16/2018    Past Surgical History:  Procedure Laterality Date  . INCISION AND DRAINAGE PERIRECTAL ABSCESS N/A 09/24/2016   Procedure: IRRIGATION AND DEBRIDEMENT  PERIRECTAL ABSCESS;  Surgeon: Ricarda Frame, MD;  Location: [facility] ORS;  Service: General;  Laterality: N/A;  . none         Family History  Problem Relation Age of Onset  . Alcohol abuse Mother   . Alcohol abuse Father     Social History   Tobacco Use  . Smoking status: Former Smoker    Packs/day: 0.50    Types: Cigarettes  . Smokeless tobacco: Never Used  . Tobacco comment: unable to smoke while incarcerated 6+ months 02/13/20  Vaping Use  . Vaping Use: Never used  Substance Use Topics  . Alcohol use: Yes    Comment: "I drink all I can"  . Drug use: Not Currently    Types: Marijuana, Methamphetamines    Comment: last used 04/10/2019    Home Medications Prior to Admission medications   Medication Sig Start Date End Date Taking? Authorizing Provider  chlordiazePOXIDE (LIBRIUM) 25 MG capsule 50mg  PO TID x 1D, then 25-50mg  PO BID X 1D, then 25-50mg  PO QD X 1D 02/26/21   02/28/21, MD  elvitegravir-cobicistat-emtricitabine-tenofovir (GENVOYA) 150-150-200-10 MG TABS tablet Take 1 tablet by mouth daily with breakfast. 09/28/20   Clapacs, 09/30/20, MD  famotidine (PEPCID) 20 MG tablet Take 1 tablet (20 mg total) by mouth 2 (two) times daily. Patient not taking: Reported on 06/01/2019 05/14/19 06/02/19  06/04/19, MD    Allergies    Tegretol [carbamazepine] and Caffeine  Review of Systems   Review of Systems  Unable to perform ROS: Mental status change    Physical Exam Updated Vital Signs BP 135/82   Pulse 94  Temp 98.2 F (36.8 C)   Resp 16   Ht 6\' 1"  (1.854 m)   Wt 70 kg   SpO2 97%   BMI 20.36 kg/m   Physical Exam Vitals and nursing note reviewed.  Constitutional:      General: He is not in acute distress.    Appearance: He is not toxic-appearing.  HENT:     Head: Normocephalic and atraumatic.     Nose: Nose normal.     Mouth/Throat:     Mouth: Mucous membranes are moist.     Pharynx: Oropharynx is clear. Uvula midline. No oropharyngeal exudate,  posterior oropharyngeal erythema or uvula swelling.     Tonsils: No tonsillar exudate.  Eyes:     General: Lids are normal.        Right eye: No discharge.        Left eye: No discharge.     Conjunctiva/sclera:     Right eye: Right conjunctiva is injected.     Left eye: Left conjunctiva is injected.     Pupils: Pupils are equal.  Neck:     Trachea: Trachea normal.  Cardiovascular:     Rate and Rhythm: Normal rate and regular rhythm.     Pulses: Normal pulses.     Heart sounds: Normal heart sounds. No murmur heard.   Pulmonary:     Effort: Pulmonary effort is normal. No tachypnea, bradypnea, accessory muscle usage, prolonged expiration or respiratory distress.     Breath sounds: Normal breath sounds. No wheezing or rales.  Chest:     Chest wall: No mass, lacerations, deformity, swelling, tenderness, crepitus or edema.  Abdominal:     General: Bowel sounds are normal. There is no distension.     Palpations: Abdomen is soft.     Tenderness: There is no abdominal tenderness. There is no right CVA tenderness, left CVA tenderness, guarding or rebound.  Musculoskeletal:        General: No deformity.     Cervical back: Neck supple. No edema, rigidity or crepitus. No pain with movement, spinous process tenderness or muscular tenderness.     Right lower leg: No edema.     Left lower leg: No edema.  Lymphadenopathy:     Cervical: No cervical adenopathy.  Skin:    General: Skin is warm and dry.  Neurological:     Mental Status: He is lethargic.     Comments: Patient significantly intoxicated on my initial exam, minimally responsive to verbal stimuli, vital signs remained stable.  He is not able to follow commands, in no acute distress.  Psychiatric:        Mood and Affect: Mood normal.     ED Results / Procedures / Treatments   Labs (all labs ordered are listed, but only abnormal results are displayed) Labs Reviewed  COMPREHENSIVE METABOLIC PANEL - Abnormal; Notable for the  following components:      Result Value   Total Protein 8.4 (*)    AST 88 (*)    ALT 62 (*)    All other components within normal limits  ETHANOL - Abnormal; Notable for the following components:   Alcohol, Ethyl (B) 441 (*)    All other components within normal limits  CBC WITH DIFFERENTIAL/PLATELET - Abnormal; Notable for the following components:   Platelets 140 (*)    All other components within normal limits  SALICYLATE LEVEL - Abnormal; Notable for the following components:   Salicylate Lvl <7.0 (*)    All other  components within normal limits  ACETAMINOPHEN LEVEL - Abnormal; Notable for the following components:   Acetaminophen (Tylenol), Serum <10 (*)    All other components within normal limits  CBG MONITORING, ED - Abnormal; Notable for the following components:   Glucose-Capillary 102 (*)    All other components within normal limits  RESP PANEL BY RT-PCR (FLU A&B, COVID) ARPGX2  RAPID URINE DRUG SCREEN, HOSP PERFORMED    EKG None  Radiology No results found.  Procedures Procedures  Medications Ordered in ED Medications  LORazepam (ATIVAN) injection 0-4 mg ( Intravenous See Alternative 03/06/21 2206)    Or  LORazepam (ATIVAN) tablet 0-4 mg (2 mg Oral Given 03/06/21 2206)  LORazepam (ATIVAN) injection 0-4 mg (has no administration in time range)    Or  LORazepam (ATIVAN) tablet 0-4 mg (has no administration in time range)  thiamine tablet 100 mg (100 mg Oral Given 03/06/21 2206)    Or  thiamine (B-1) injection 100 mg ( Intravenous See Alternative 03/06/21 2206)  folic acid (FOLVITE) tablet 1 mg (1 mg Oral Given 03/06/21 2206)  thiamine (B-1) injection 100 mg (100 mg Intravenous Given 03/06/21 1621)  sodium chloride 0.9 % bolus 500 mL (0 mLs Intravenous Stopped 03/06/21 1744)    ED Course  I have reviewed the triage vital signs and the nursing notes.  Pertinent labs & imaging results that were available during my care of the patient were reviewed by me and  considered in my medical decision making (see chart for details).    MDM Rules/Calculators/A&P                          35 year old male with history of HIV, homelessness, and extensive history of alcohol abuse.  Presents via EMS for concern of intoxication public setting.  Vital signs are normal on intake.  Cardiopulmonary exam is normal, abdominal exam is benign, however patient is significantly intoxicated and exam may not be reliable.  Will reevaluate once patient has sobered up.  Will obtain basic laboratory studies and give IV fluid bolus.  CBC, CMP unremarkable, 441.  Patient remains inebriated.  Will allow to metabolize to freedom.  Patient reevaluated after several hours of rest, he is arousable and able to answer questions, has difficulty standing on his own.  Patient reevaluated that has a significant tremor of arms bilaterally, endorses nausea, and aching all over.  Time of my exam patient is CIWA score 15, will place on CIWA protocol and administer dose of Ativan.  Care of this patient signed out to oncoming ED provider, Harvie Heck, shift change.  All pertinent HPI, physical exam, laboratory findings were discussed with her prior to my departure.  Appreciate her collaboration in the care this patient.  This chart was dictated using voice recognition software, Dragon. Despite the best efforts of this provider to proofread and correct errors, errors may still occur which can change documentation meaning.  Final Clinical Impression(s) / ED Diagnoses Final diagnoses:  None    Rx / DC Orders ED Discharge Orders    None       Sherrilee Gilles 03/07/21 0017    Mancel Bale, MD 03/11/21 1019

## 2021-03-06 NOTE — ED Notes (Signed)
Pt declines cardiac monitoring

## 2021-03-06 NOTE — ED Triage Notes (Signed)
BIB EMS from [employer], per EMS patient had 10 shots of vodka about 1 hour ago and is now intoxicated. Hx of same.

## 2021-03-07 ENCOUNTER — Emergency Department (HOSPITAL_COMMUNITY): Payer: Self-pay

## 2021-03-07 ENCOUNTER — Emergency Department (HOSPITAL_COMMUNITY)
Admission: EM | Admit: 2021-03-07 | Discharge: 2021-03-09 | Disposition: A | Discharge status: Home / Self Care | Payer: Self-pay | Attending: Emergency Medicine | Admitting: Emergency Medicine

## 2021-03-07 ENCOUNTER — Other Ambulatory Visit: Payer: Self-pay

## 2021-03-07 ENCOUNTER — Encounter (HOSPITAL_COMMUNITY): Payer: Self-pay | Admitting: Emergency Medicine

## 2021-03-07 DIAGNOSIS — B20 Human immunodeficiency virus [HIV] disease: Secondary | ICD-10-CM | POA: Insufficient documentation

## 2021-03-07 DIAGNOSIS — F10929 Alcohol use, unspecified with intoxication, unspecified: Secondary | ICD-10-CM | POA: Insufficient documentation

## 2021-03-07 DIAGNOSIS — F1390 Sedative, hypnotic, or anxiolytic use, unspecified, uncomplicated: Secondary | ICD-10-CM | POA: Insufficient documentation

## 2021-03-07 DIAGNOSIS — F332 Major depressive disorder, recurrent severe without psychotic features: Secondary | ICD-10-CM | POA: Diagnosis present

## 2021-03-07 DIAGNOSIS — F1490 Cocaine use, unspecified, uncomplicated: Secondary | ICD-10-CM | POA: Insufficient documentation

## 2021-03-07 DIAGNOSIS — S0081XA Abrasion of other part of head, initial encounter: Secondary | ICD-10-CM | POA: Insufficient documentation

## 2021-03-07 DIAGNOSIS — X788XXA Intentional self-harm by other sharp object, initial encounter: Secondary | ICD-10-CM | POA: Insufficient documentation

## 2021-03-07 DIAGNOSIS — F10920 Alcohol use, unspecified with intoxication, uncomplicated: Secondary | ICD-10-CM

## 2021-03-07 DIAGNOSIS — Z20822 Contact with and (suspected) exposure to covid-19: Secondary | ICD-10-CM | POA: Insufficient documentation

## 2021-03-07 DIAGNOSIS — F1910 Other psychoactive substance abuse, uncomplicated: Secondary | ICD-10-CM | POA: Diagnosis present

## 2021-03-07 DIAGNOSIS — Y908 Blood alcohol level of 240 mg/100 ml or more: Secondary | ICD-10-CM | POA: Insufficient documentation

## 2021-03-07 DIAGNOSIS — R45851 Suicidal ideations: Secondary | ICD-10-CM | POA: Insufficient documentation

## 2021-03-07 DIAGNOSIS — Z87891 Personal history of nicotine dependence: Secondary | ICD-10-CM | POA: Insufficient documentation

## 2021-03-07 DIAGNOSIS — F1094 Alcohol use, unspecified with alcohol-induced mood disorder: Secondary | ICD-10-CM | POA: Insufficient documentation

## 2021-03-07 LAB — ETHANOL: Alcohol, Ethyl (B): <10 mg/dL (ref <10)

## 2021-03-07 LAB — COMPREHENSIVE METABOLIC PANEL
ALT: 16 U/L (ref 0–44)
AST: 32 U/L (ref 15–41)
Albumin: 3.8 g/dL (ref 3.5–5.0)
Alkaline Phosphatase: 66 U/L (ref 38–126)
Anion gap: 6 (ref 5–15)
BUN: 24 mg/dL — ABNORMAL HIGH (ref 6–20)
CO2: 27 mmol/L (ref 22–32)
Calcium: 9 mg/dL (ref 8.9–10.3)
Chloride: 105 mmol/L (ref 98–111)
Creatinine, Ser: 1.37 mg/dL — ABNORMAL HIGH (ref 0.61–1.24)
GFR, Estimated: >60 mL/min (ref >60)
Glucose, Bld: 99 mg/dL (ref 70–99)
Potassium: 3.5 mmol/L (ref 3.5–5.1)
Sodium: 138 mmol/L (ref 135–145)
Total Bilirubin: 0.9 mg/dL (ref 0.3–1.2)
Total Protein: 6.8 g/dL (ref 6.5–8.1)

## 2021-03-07 LAB — ACETAMINOPHEN LEVEL: Acetaminophen (Tylenol), Serum: <10 ug/mL — ABNORMAL LOW (ref 10–30)

## 2021-03-07 LAB — SALICYLATE LEVEL: Salicylate Lvl: <7 mg/dL — ABNORMAL LOW (ref 7.0–30.0)

## 2021-03-07 LAB — RESP PANEL BY RT-PCR (FLU A&B, COVID) ARPGX2
Influenza A by PCR: NEGATIVE
Influenza B by PCR: NEGATIVE
SARS Coronavirus 2 by RT PCR: NEGATIVE

## 2021-03-07 IMAGING — CT CT HEAD W/O CM
3 series · 14 of 47 positions shown, 16 images · non-contrast
Comparison: CT head [DATE], CT cervical spine [DATE]

CLINICAL DATA: Found in store after drinking 4 bottles of wine,
suicidal

EXAM:
CT HEAD WITHOUT CONTRAST
CT CERVICAL SPINE WITHOUT CONTRAST
TECHNIQUE: Multidetector CT imaging of the head and cervical spine was
performed following the standard protocol without intravenous
contrast. Multiplanar CT image reconstructions of the cervical spine
were also generated.

[Series 4: head wo · axial · 0.45mm/px · z∈[-152,-7]mm · 8 of 35 slices shown, 10 images]
[im 3/35  brain]
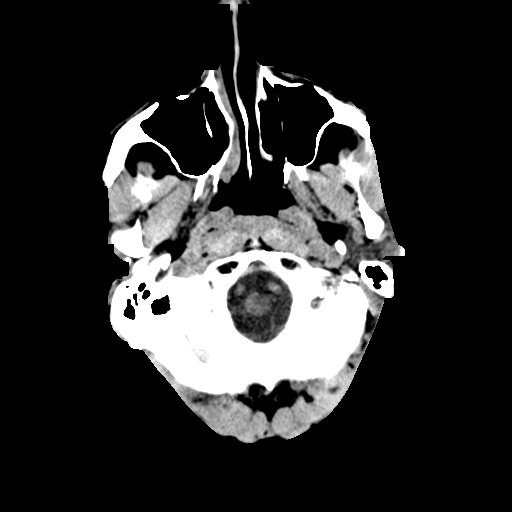
[im 3/35  bone]
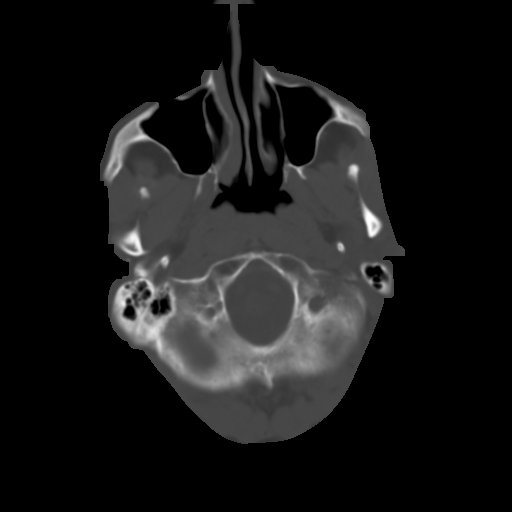
[im 8/35  brain]
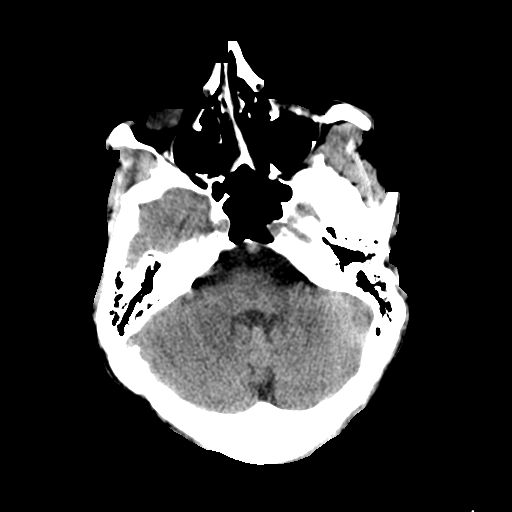
[im 11/35  brain]
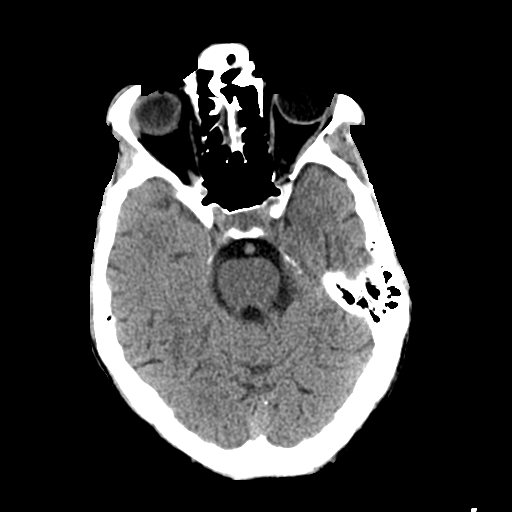
[im 16/35  brain]
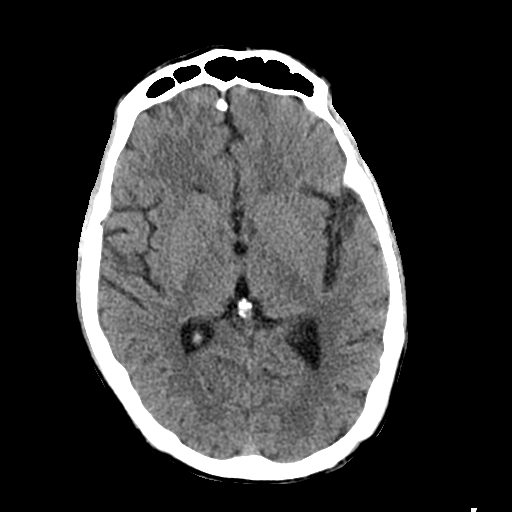
[im 19/35  brain]
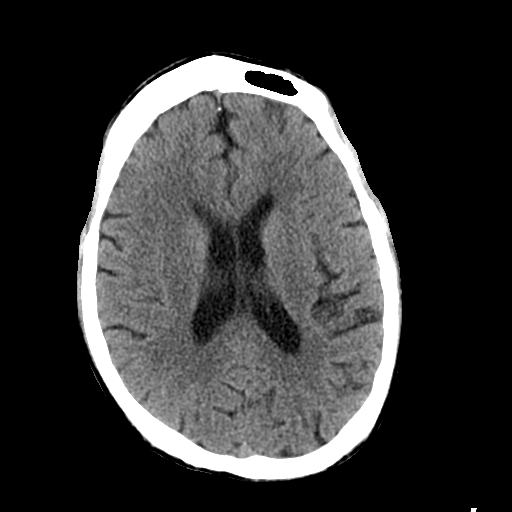
[im 19/35  bone]
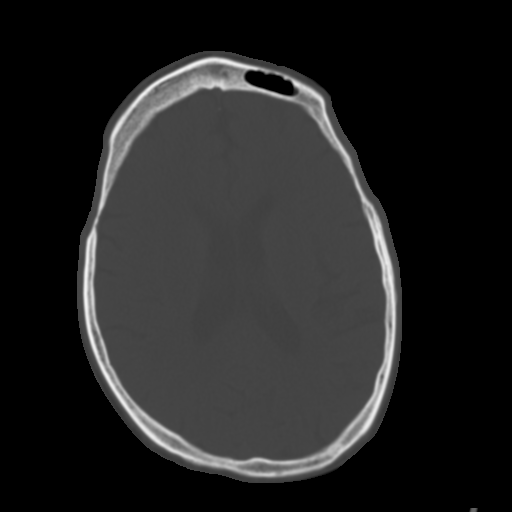
[im 24/35  brain]
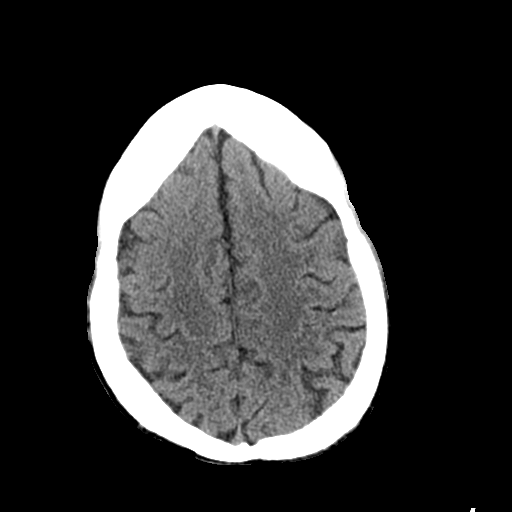
[im 27/35  brain]
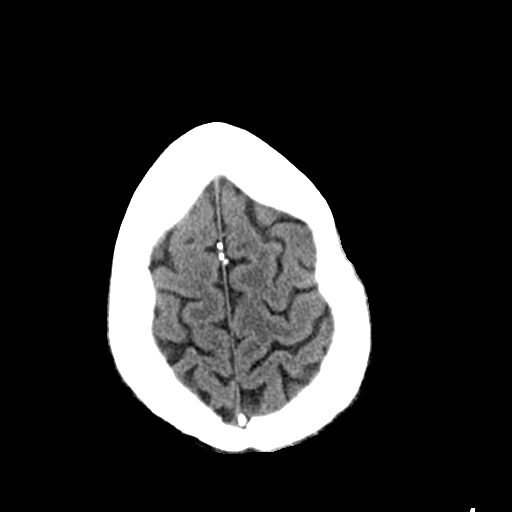
[im 32/35  brain]
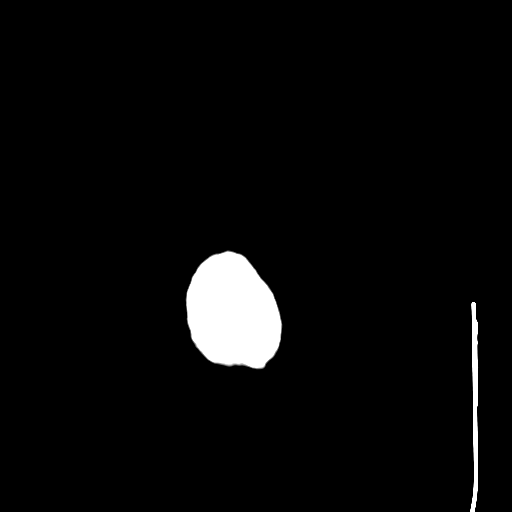

[Series 6: coronal soft tissue · coronal · 0.33mm/px · 3 of 78 slices shown]
[im 26/78  brain]
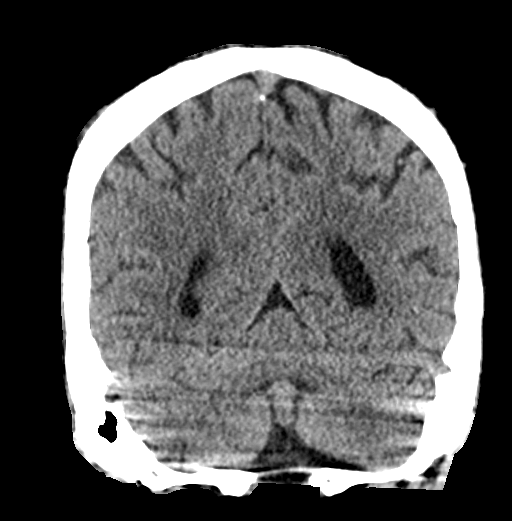
[im 35/78  brain]
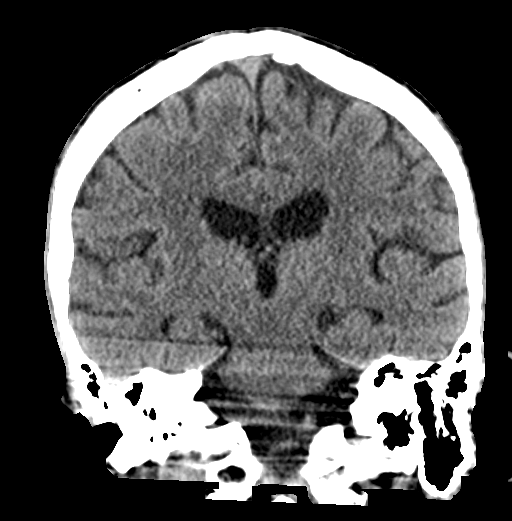
[im 43/78  brain]
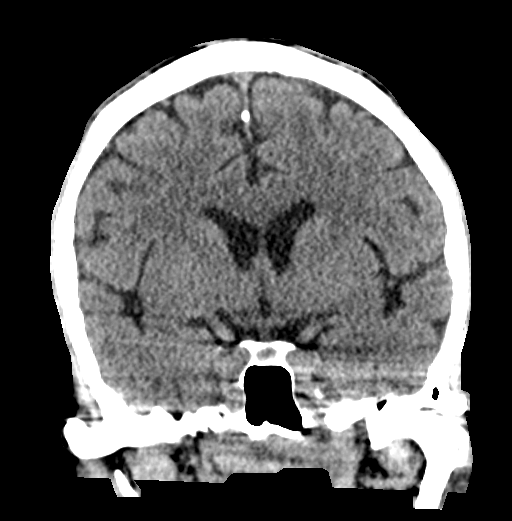

[Series 7: sagittal soft tissue · sagittal · 0.34mm/px · 3 of 58 slices shown]
[im 20/58  brain]
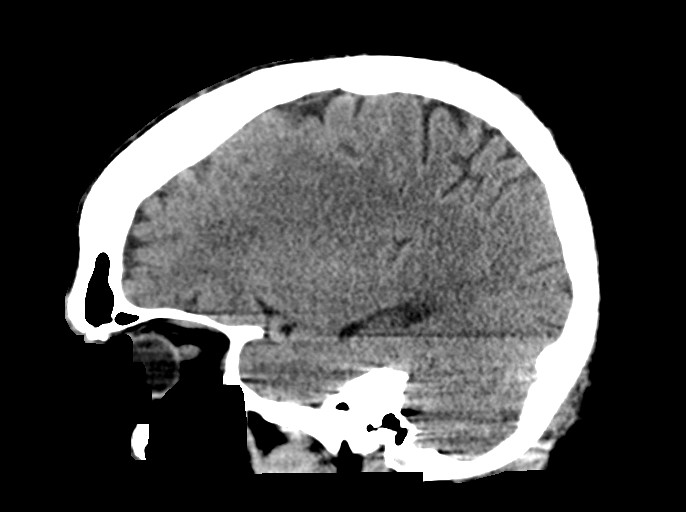
[im 29/58  brain]
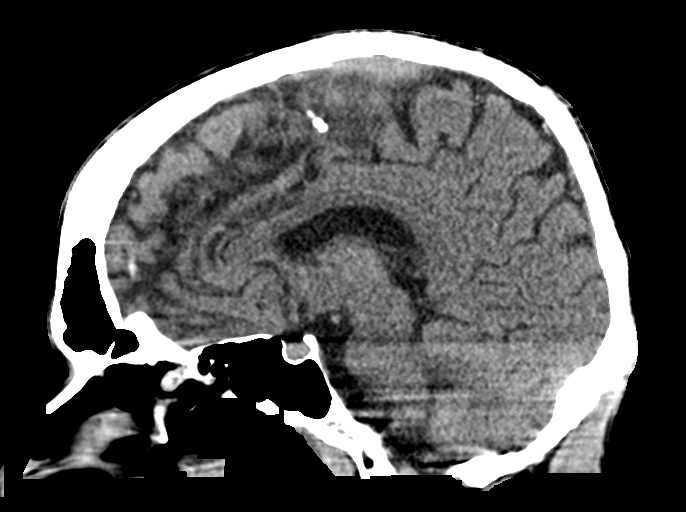
[im 39/58  brain]
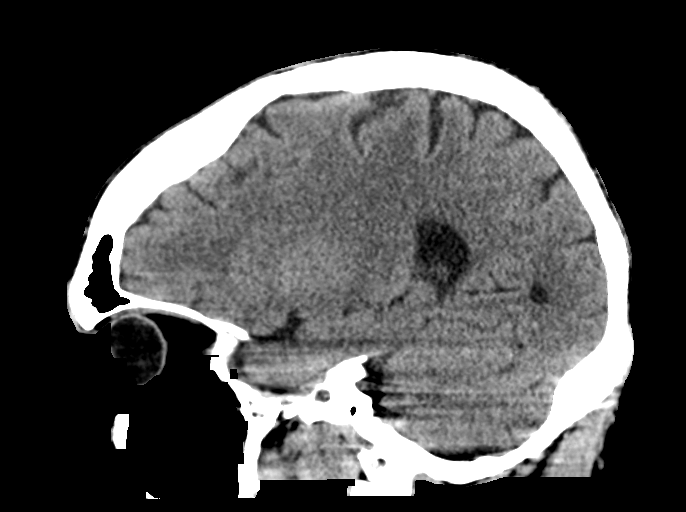

[14 of 47 positions shown; findings below may reference images not displayed]

FINDINGS: CT HEAD FINDINGS

Brain: No evidence of acute infarction, hemorrhage, hydrocephalus,
extra-axial collection, visible mass lesion or mass effect.

Vascular: No hyperdense vessel or unexpected calcification.

Skull: No calvarial fracture or suspicious osseous lesion. No scalp
swelling or hematoma.

Sinuses/Orbits: Paranasal sinuses and mastoid air cells are
predominantly clear. Included orbital structures are unremarkable.

Other: Debris in the bilateral external auditory canals.

CT CERVICAL SPINE FINDINGS

Alignment: Preservation of the normal cervical lordosis. Stepwise
retrolisthesis C3-C7 is unchanged from comparison exam and favored
to be on a spondylitic basis. No evidence of traumatic listhesis. No
abnormally widened, perched or jumped facets. Normal alignment of
the craniocervical and atlantoaxial articulations.

Skull base and vertebrae: No acute skull base fracture. No vertebral
body fracture or height loss. Normal bone mineralization. No
worrisome osseous lesions. Mild arthrosis about the atlantodental
interval. Multilevel spondylitic changes as detailed below.

Soft tissues and spinal canal: No pre or paravertebral fluid or
swelling. No visible canal hematoma. Visible airways are patent.

Disc levels: Multilevel intervertebral disc height loss with
spondylitic endplate changes. Small disc osteophyte complexes
partially efface the ventral thecal sac most pronounced at C4-5
where there is mild canal stenosis. Slight uncinate spurring facet
hypertrophic changes result in mild bilateral foraminal narrowing
C6-7 bilaterally.

Upper chest: No acute abnormality in the upper chest or imaged lung
apices.

Other: None.
IMPRESSION: No acute intracranial abnormality.

No acute cervical spine fracture or traumatic listhesis.

Multilevel cervical spondylitic changes and likely degenerative
stepwise retrolisthesis, stable from prior.

Debris in the bilateral external auditory canals, correlate for
cerumen impaction.

## 2021-03-07 IMAGING — CT CT CERVICAL SPINE W/O CM
3 of 4 series · 9 of 33 positions shown, 11 images · non-contrast
Comparison: CT head [DATE], CT cervical spine [DATE]

CLINICAL DATA: Found in store after drinking 4 bottles of wine,
suicidal

EXAM:
CT HEAD WITHOUT CONTRAST
CT CERVICAL SPINE WITHOUT CONTRAST
TECHNIQUE: Multidetector CT imaging of the head and cervical spine was
performed following the standard protocol without intravenous
contrast. Multiplanar CT image reconstructions of the cervical spine
were also generated.

[Series 6: orthogonal bone · axial · 0.23mm/px · z∈[-242,-242]mm · 1 of 127 slices shown, 2 images]
[im 73/127  soft-tissue]
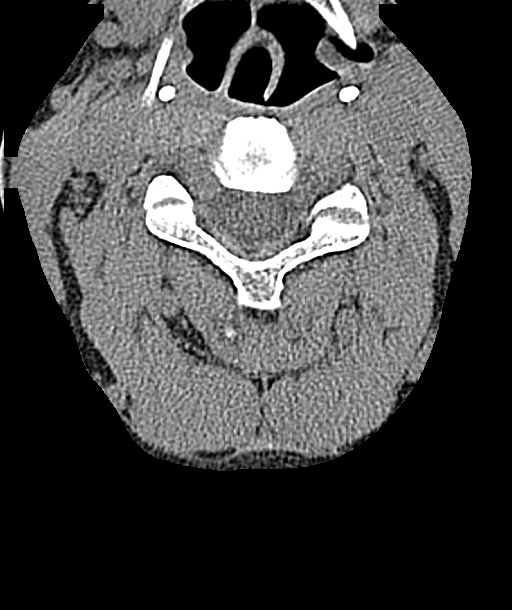
[im 73/127  bone]
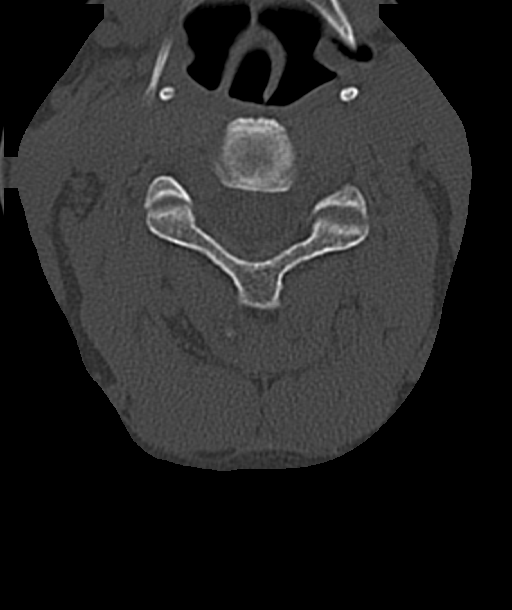

[Series 7: coronal bone · coronal · 0.23mm/px · 3 of 72 slices shown]
[im 15/72  bone]
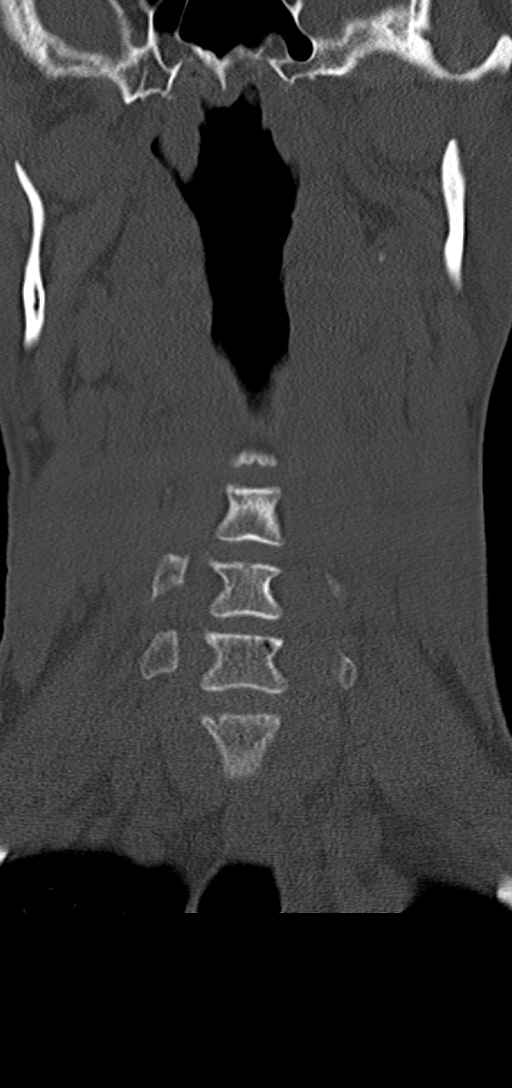
[im 29/72  bone]
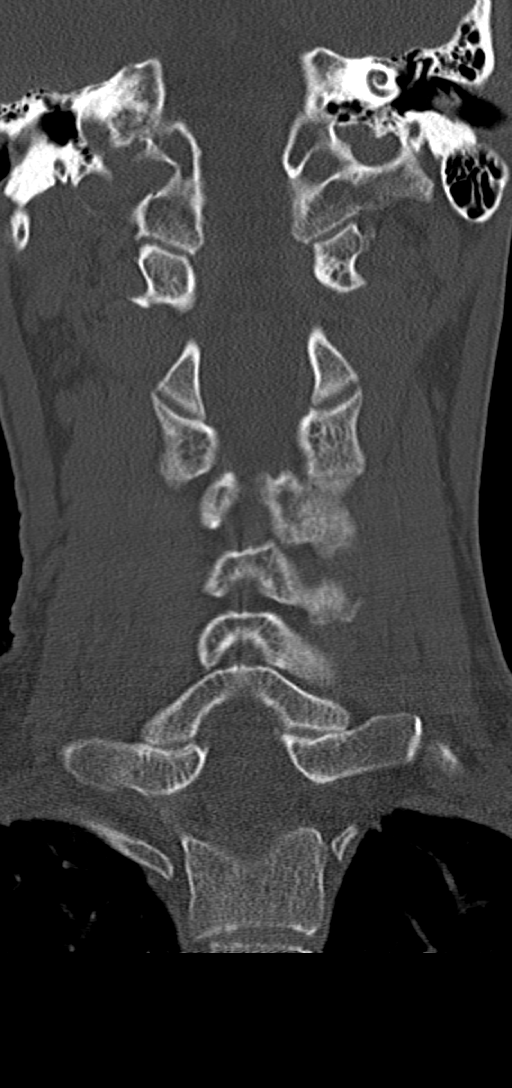
[im 43/72  bone]
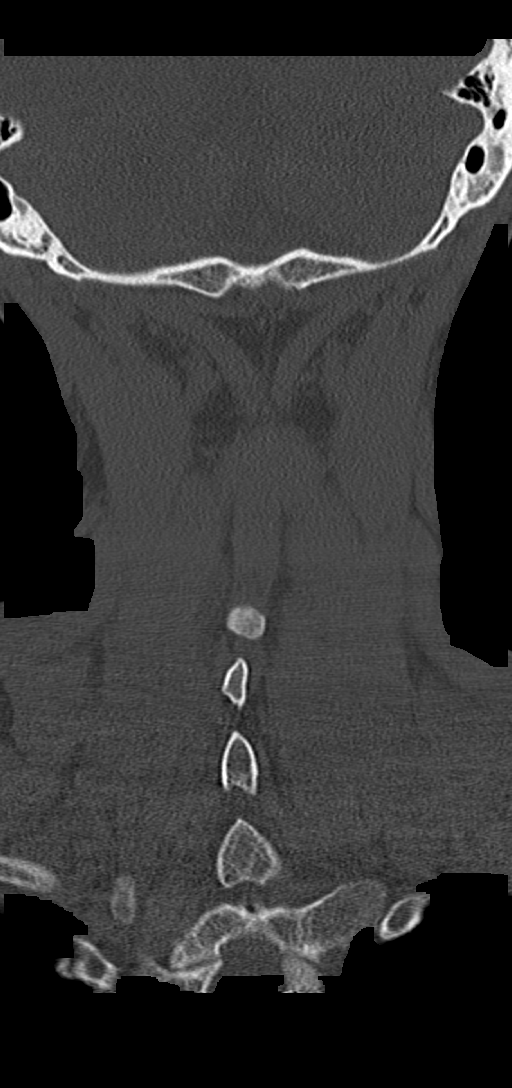

[Series 8: sagittal bone · sagittal · 0.28mm/px · 5 of 61 slices shown, 6 images]
[im 21/61  bone]
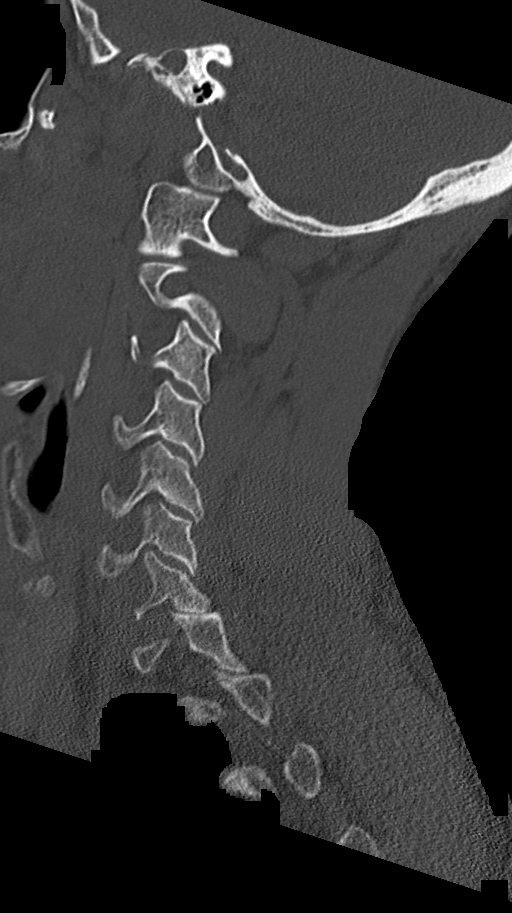
[im 26/61  bone]
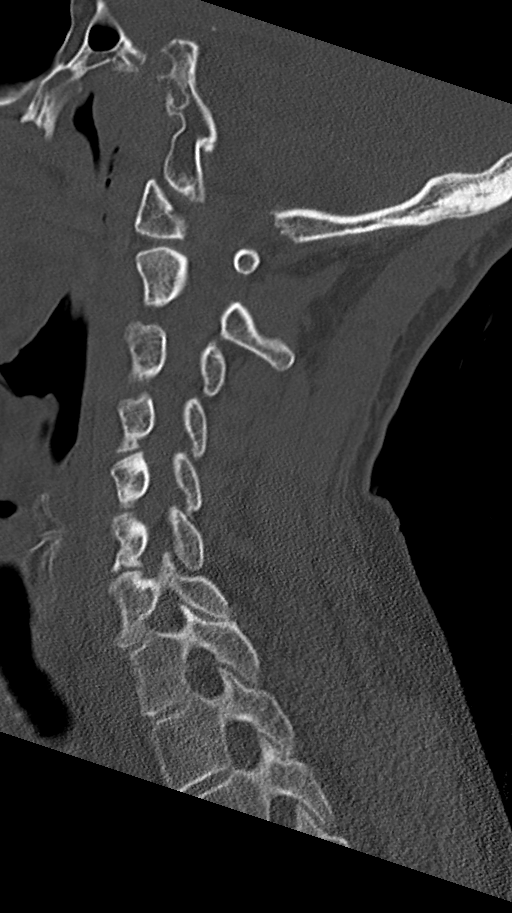
[im 31/61  soft-tissue]
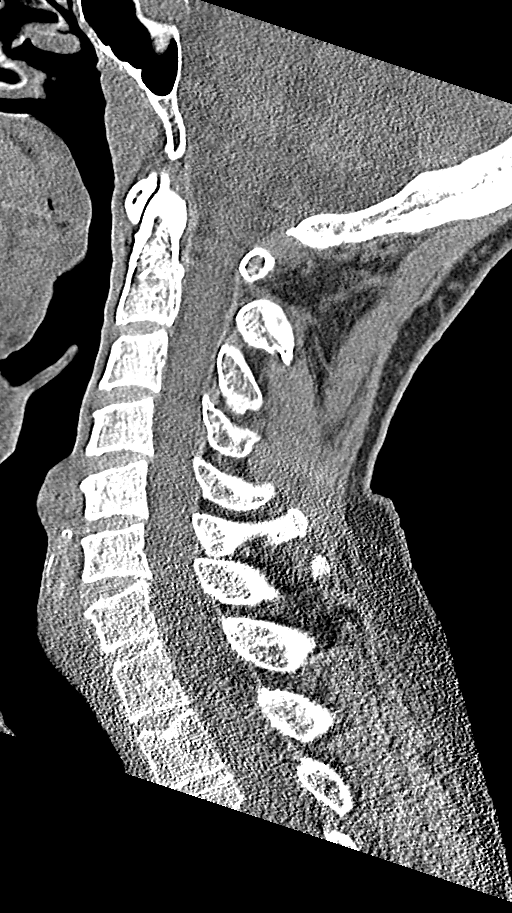
[im 31/61  bone]
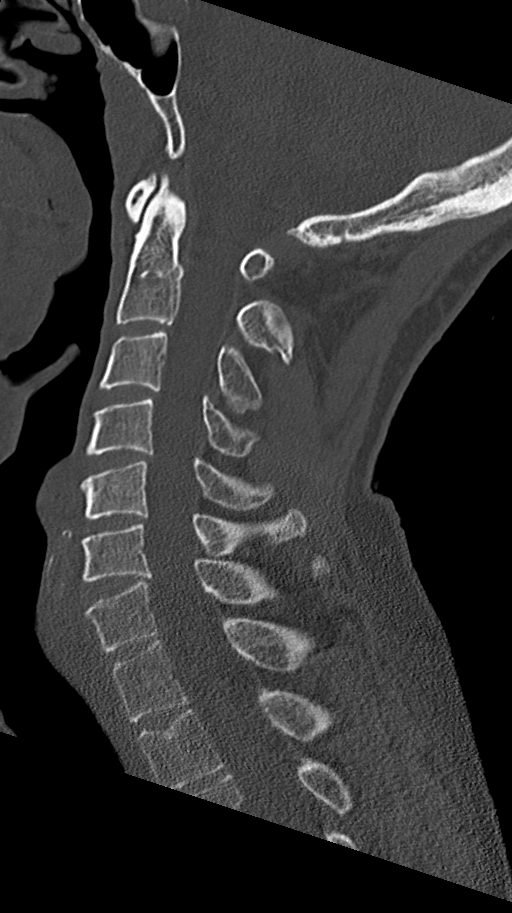
[im 36/61  bone]
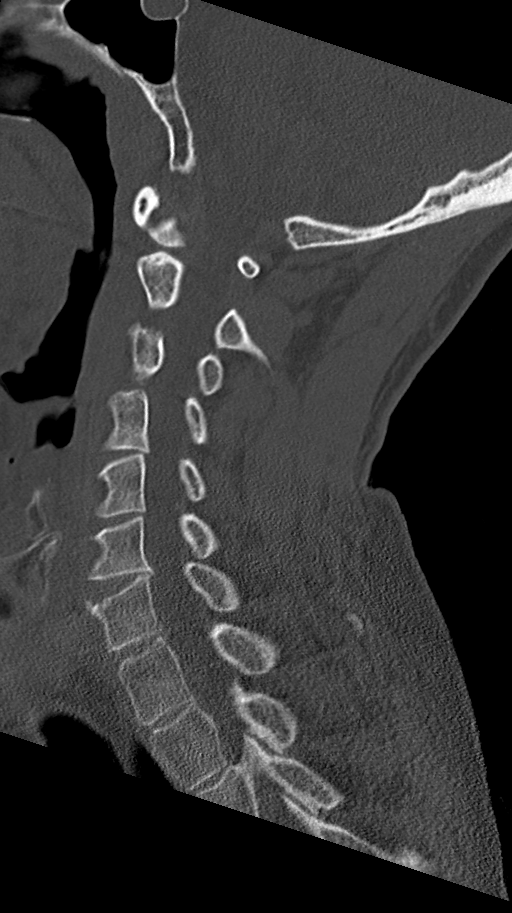
[im 41/61  bone]
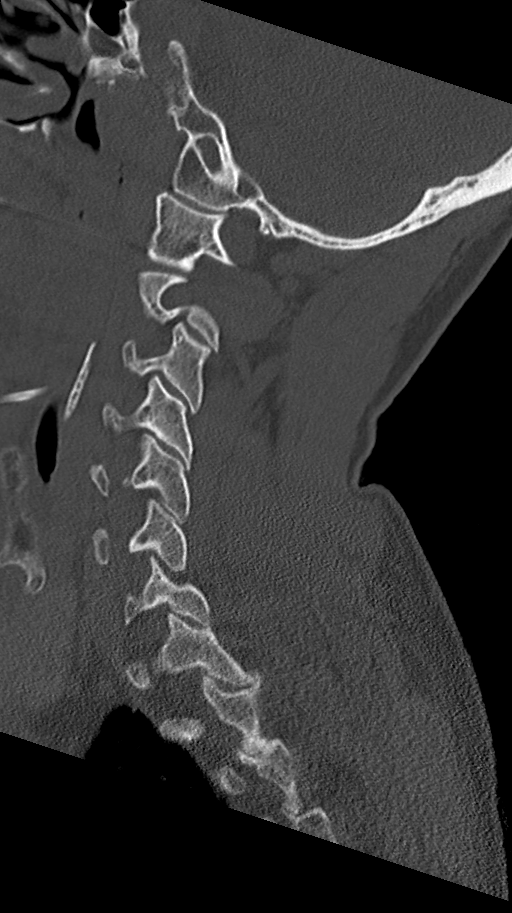

[9 of 33 positions shown; findings below may reference images not displayed]

FINDINGS: CT HEAD FINDINGS

Brain: No evidence of acute infarction, hemorrhage, hydrocephalus,
extra-axial collection, visible mass lesion or mass effect.

Vascular: No hyperdense vessel or unexpected calcification.

Skull: No calvarial fracture or suspicious osseous lesion. No scalp
swelling or hematoma.

Sinuses/Orbits: Paranasal sinuses and mastoid air cells are
predominantly clear. Included orbital structures are unremarkable.

Other: Debris in the bilateral external auditory canals.

CT CERVICAL SPINE FINDINGS

Alignment: Preservation of the normal cervical lordosis. Stepwise
retrolisthesis C3-C7 is unchanged from comparison exam and favored
to be on a spondylitic basis. No evidence of traumatic listhesis. No
abnormally widened, perched or jumped facets. Normal alignment of
the craniocervical and atlantoaxial articulations.

Skull base and vertebrae: No acute skull base fracture. No vertebral
body fracture or height loss. Normal bone mineralization. No
worrisome osseous lesions. Mild arthrosis about the atlantodental
interval. Multilevel spondylitic changes as detailed below.

Soft tissues and spinal canal: No pre or paravertebral fluid or
swelling. No visible canal hematoma. Visible airways are patent.

Disc levels: Multilevel intervertebral disc height loss with
spondylitic endplate changes. Small disc osteophyte complexes
partially efface the ventral thecal sac most pronounced at C4-5
where there is mild canal stenosis. Slight uncinate spurring facet
hypertrophic changes result in mild bilateral foraminal narrowing
C6-7 bilaterally.

Upper chest: No acute abnormality in the upper chest or imaged lung
apices.

Other: None.
IMPRESSION: No acute intracranial abnormality.

No acute cervical spine fracture or traumatic listhesis.

Multilevel cervical spondylitic changes and likely degenerative
stepwise retrolisthesis, stable from prior.

Debris in the bilateral external auditory canals, correlate for
cerumen impaction.

## 2021-03-07 NOTE — ED Notes (Signed)
Patient back from CT.

## 2021-03-07 NOTE — ED Notes (Signed)
Bus pass given to pt. Pt ambulatory out of ER.

## 2021-03-07 NOTE — Discharge Instructions (Signed)
Follow-up with primary care within 3 days.  Return to the ER for any new or worsening symptoms or any other concerns.

## 2021-03-07 NOTE — ED Triage Notes (Signed)
The patient was found at a store after drinking 4 bottles of wine. Patient is suicidal and kept asking EMS for their scissors. He would only allow EMS to take his BP.     EMS vitals: 136/80 BP

## 2021-03-07 NOTE — ED Provider Notes (Signed)
[facility]-EMERGENCY DEPT Provider Note   CSN: [id number] Arrival date & time: 03/07/21  2105     History Chief Complaint  Patient presents with  . Suicidal    Gary [name] is a 35 y.o. male with a history of HIV, alcohol abuse, prior subdural hematoma, & thrombocytopenia who presents to the ED via EMS for evaluation of SI per triage note. Patient reportedly was found drinking multiple bottles of wine at a store, he was stating he was suicidal, kept asking EMS for their scissors, and was transported to the ED. On my assessment the patient is sleeping, he is arousable to painful stimuli but does not answer question.  Level 5 caveat applies secondary to suspected EtOH intoxication.   HPI     Past Medical History:  Diagnosis Date  . Alcohol abuse   . HIV (human immunodeficiency virus infection) (HCC)   . Subdural hematoma [facility])     Patient Active Problem List   Diagnosis Date Noted  . 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 Elevated troponin 10/19/2020  . Alcohol withdrawal (HCC) 10/19/2020  . Alcohol abuse   . Suicidal ideation 06/14/2020  . Pancreatitis 06/13/2020  . 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  . Malnutrition of moderate degree 05/07/2020  . Gastrointestinal hemorrhage   . Alcoholic hepatitis without ascites   . Melena 05/05/2020  . Alcoholic ketoacidosis 05/05/2020  . Thrombocytopenia (HCC) 07/25/2019  . Transaminitis 07/25/2019  . Traumatic subdural hematoma (HCC) 07/02/2019  . Alcohol abuse with intoxication (HCC) 07/02/2019  . 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 Alcohol use disorder, severe, dependence (HCC) 04/16/2019  . 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 HIV disease (HCC) 06/16/2018  . Polysubstance abuse (HCC) 06/16/2018  . Cigarette smoker 06/16/2018    Past Surgical History:  Procedure Laterality Date  . INCISION AND DRAINAGE PERIRECTAL  ABSCESS N/A 09/24/2016   Procedure: IRRIGATION AND DEBRIDEMENT PERIRECTAL ABSCESS;  Surgeon: Ricarda Frame, MD;  Location: [facility] ORS;  Service: General;  Laterality: N/A;  . none         Family History  Problem Relation Age of Onset  . Alcohol abuse Mother   . Alcohol abuse Father     Social History   Tobacco Use  . Smoking status: Former Smoker    Packs/day: 0.50    Types: Cigarettes  . Smokeless tobacco: Never Used  . Tobacco comment: unable to smoke while incarcerated 6+ months 02/13/20  Vaping Use  . Vaping Use: Never used  Substance Use Topics  . Alcohol use: Yes    Comment: "I drink all I can"  . Drug use: Not Currently    Types: Marijuana, Methamphetamines    Comment: last used 04/10/2019    Home Medications Prior to Admission medications   Medication Sig Start Date End Date Taking? Authorizing Provider  chlordiazePOXIDE (LIBRIUM) 25 MG capsule 50mg  PO TID x 1D, then 25-50mg  PO BID X 1D, then 25-50mg  PO QD X 1D 02/26/21   02/28/21, MD  elvitegravir-cobicistat-emtricitabine-tenofovir (GENVOYA) 150-150-200-10 MG TABS tablet Take 1 tablet by mouth daily with breakfast. 09/28/20   Clapacs, 09/30/20, MD  famotidine (PEPCID) 20 MG tablet Take 1 tablet (20 mg total) by mouth 2 (two) times daily. Patient not taking: Reported on 06/01/2019 05/14/19 06/02/19  06/04/19, MD    Allergies    Tegretol [carbamazepine] and Caffeine  Review of Systems   Review of Systems  Unable to perform ROS: Other (Alcohol  intoxication, not answering questions.)    Physical Exam Updated Vital Signs BP 122/73   Pulse 93   Temp 98.9 F (37.2 C) (Oral)   Resp 18   SpO2 94%   Physical Exam Vitals and nursing note reviewed.  Constitutional:      General: He is not in acute distress.    Comments: Patient is sleeping, arousable to painful stimuli, moves extremities when awake.   HENT:     Head: Normocephalic.     Comments: Patient has a 1 cm linear abrasion to the left temple  region.  No visible foreign bodies.  Eyes:     Pupils: Pupils are equal, round, and reactive to light.     Comments: Mild conjunctival injection noted.  Cardiovascular:     Rate and Rhythm: Normal rate and regular rhythm.  Pulmonary:     Effort: Pulmonary effort is normal.     Breath sounds: Normal breath sounds.  Abdominal:     General: There is no distension.     Palpations: Abdomen is soft.     Tenderness: There is no abdominal tenderness. There is no guarding or rebound.  Musculoskeletal:     Cervical back: Neck supple. No rigidity.  Skin:    General: Skin is warm and dry.     Capillary Refill: Capillary refill takes less than 2 seconds.  Neurological:     Comments: Protecting airway.  Psychiatric:     Comments: Difficult to assess as patient is sleeping, not answering questions when awake.     ED Results / Procedures / Treatments   Labs (all labs ordered are listed, but only abnormal results are displayed) Labs Reviewed  COMPREHENSIVE METABOLIC PANEL - Abnormal; Notable for the following components:      Result Value   BUN 24 (*)    Creatinine, Ser 1.37 (*)    All other components within normal limits  SALICYLATE LEVEL - Abnormal; Notable for the following components:   Salicylate Lvl <7.0 (*)    All other components within normal limits  ACETAMINOPHEN LEVEL - Abnormal; Notable for the following components:   Acetaminophen (Tylenol), Serum <10 (*)    All other components within normal limits  RAPID URINE DRUG SCREEN, HOSP PERFORMED - Abnormal; Notable for the following components:   Cocaine POSITIVE (*)    Benzodiazepines POSITIVE (*)    All other components within normal limits  CBC - Abnormal; Notable for the following components:   WBC 2.8 (*)    RBC 3.78 (*)    Hemoglobin 12.7 (*)    HCT 37.2 (*)    Platelets 106 (*)    All other components within normal limits  COMPREHENSIVE METABOLIC PANEL - Abnormal; Notable for the following components:   Potassium 3.3  (*)    Creatinine, Ser 0.53 (*)    AST 54 (*)    ALT 45 (*)    All other components within normal limits  ETHANOL - Abnormal; Notable for the following components:   Alcohol, Ethyl (B) 247 (*)    All other components within normal limits  RESP PANEL BY RT-PCR (FLU A&B, COVID) ARPGX2  ETHANOL  CBC    EKG None  Radiology CT Head Wo Contrast  Result Date: 03/08/2021 CLINICAL DATA:  Found in store after drinking 4 bottles of wine, suicidal EXAM: CT HEAD WITHOUT CONTRAST CT CERVICAL SPINE WITHOUT CONTRAST TECHNIQUE: Multidetector CT imaging of the head and cervical spine was performed following the standard protocol without intravenous contrast. Multiplanar CT  image reconstructions of the cervical spine were also generated. COMPARISON:  CT head 02/21/2021, CT cervical spine 01/12/2021 FINDINGS: CT HEAD FINDINGS Brain: No evidence of acute infarction, hemorrhage, hydrocephalus, extra-axial collection, visible mass lesion or mass effect. Vascular: No hyperdense vessel or unexpected calcification. Skull: No calvarial fracture or suspicious osseous lesion. No scalp swelling or hematoma. Sinuses/Orbits: Paranasal sinuses and mastoid air cells are predominantly clear. Included orbital structures are unremarkable. Other: Debris in the bilateral external auditory canals. CT CERVICAL SPINE FINDINGS Alignment: Preservation of the normal cervical lordosis. Stepwise retrolisthesis C3-C7 is unchanged from comparison exam and favored to be on a spondylitic basis. No evidence of traumatic listhesis. No abnormally widened, perched or jumped facets. Normal alignment of the craniocervical and atlantoaxial articulations. Skull base and vertebrae: No acute skull base fracture. No vertebral body fracture or height loss. Normal bone mineralization. No worrisome osseous lesions. Mild arthrosis about the atlantodental interval. Multilevel spondylitic changes as detailed below. Soft tissues and spinal canal: No pre or  paravertebral fluid or swelling. No visible canal hematoma. Visible airways are patent. Disc levels: Multilevel intervertebral disc height loss with spondylitic endplate changes. Small disc osteophyte complexes partially efface the ventral thecal sac most pronounced at C4-5 where there is mild canal stenosis. Slight uncinate spurring facet hypertrophic changes result in mild bilateral foraminal narrowing C6-7 bilaterally. Upper chest: No acute abnormality in the upper chest or imaged lung apices. Other: None. IMPRESSION: No acute intracranial abnormality. No acute cervical spine fracture or traumatic listhesis. Multilevel cervical spondylitic changes and likely degenerative stepwise retrolisthesis, stable from prior. Debris in the bilateral external auditory canals, correlate for cerumen impaction. Electronically Signed   By: Kreg Shropshire M.D.   On: 03/08/2021 00:56   CT Cervical Spine Wo Contrast  Result Date: 03/08/2021 CLINICAL DATA:  Found in store after drinking 4 bottles of wine, suicidal EXAM: CT HEAD WITHOUT CONTRAST CT CERVICAL SPINE WITHOUT CONTRAST TECHNIQUE: Multidetector CT imaging of the head and cervical spine was performed following the standard protocol without intravenous contrast. Multiplanar CT image reconstructions of the cervical spine were also generated. COMPARISON:  CT head 02/21/2021, CT cervical spine 01/12/2021 FINDINGS: CT HEAD FINDINGS Brain: No evidence of acute infarction, hemorrhage, hydrocephalus, extra-axial collection, visible mass lesion or mass effect. Vascular: No hyperdense vessel or unexpected calcification. Skull: No calvarial fracture or suspicious osseous lesion. No scalp swelling or hematoma. Sinuses/Orbits: Paranasal sinuses and mastoid air cells are predominantly clear. Included orbital structures are unremarkable. Other: Debris in the bilateral external auditory canals. CT CERVICAL SPINE FINDINGS Alignment: Preservation of the normal cervical lordosis. Stepwise  retrolisthesis C3-C7 is unchanged from comparison exam and favored to be on a spondylitic basis. No evidence of traumatic listhesis. No abnormally widened, perched or jumped facets. Normal alignment of the craniocervical and atlantoaxial articulations. Skull base and vertebrae: No acute skull base fracture. No vertebral body fracture or height loss. Normal bone mineralization. No worrisome osseous lesions. Mild arthrosis about the atlantodental interval. Multilevel spondylitic changes as detailed below. Soft tissues and spinal canal: No pre or paravertebral fluid or swelling. No visible canal hematoma. Visible airways are patent. Disc levels: Multilevel intervertebral disc height loss with spondylitic endplate changes. Small disc osteophyte complexes partially efface the ventral thecal sac most pronounced at C4-5 where there is mild canal stenosis. Slight uncinate spurring facet hypertrophic changes result in mild bilateral foraminal narrowing C6-7 bilaterally. Upper chest: No acute abnormality in the upper chest or imaged lung apices. Other: None. IMPRESSION: No acute intracranial abnormality. No acute  cervical spine fracture or traumatic listhesis. Multilevel cervical spondylitic changes and likely degenerative stepwise retrolisthesis, stable from prior. Debris in the bilateral external auditory canals, correlate for cerumen impaction. Electronically Signed   By: Kreg Shropshire M.D.   On: 03/08/2021 00:56    Procedures Procedures   Medications Ordered in ED Medications - No data to display  ED Course  I have reviewed the triage vital signs and the nursing notes.  Pertinent labs & imaging results that were available during my care of the patient were reviewed by me and considered in my medical decision making (see chart for details).    MDM Rules/Calculators/A&P                         Patient presents to the ED via EMS due to suicidal ideations and alcohol intoxication.  He is nontoxic, resting  comfortably, vitals are within normal limits.  Patient is sleeping, arousable to painful stimuli, not currently answering my questions.  Patient is well-known to this emergency department.  Given the fact that he does have equipment to his left temporal region, is not answering questions, he does have a history of prior subdural hematoma plan for CT head/cervical spine at this time.  Screening labs ordered.  We will continue to monitor.  Additional history obtained:  Additional history obtained from chart review & nursing note review.   Imaging Studies ordered:  I ordered imaging studies which included CT head/Cspine, I independently reviewed, formal radiology impression shows: No acute intracranial abnormality. No acute cervical spine fracture or traumatic listhesis. Additional findings as above.   ED Course:  01:45: Patient's blood work seems atypical from his baseline, especially with a undetectable ethanol level, I called the lab regarding this, the staff member Misty Stanley that I spoke with states that she received 2 sets of blood for this patient very close together with similar tubes, overall per discussion we both are concerned that the patient's blood that was run may not have been his and was mislabeled.  We will repeat CBC, CMP, and ethanol level.  I do not feel that the patient needs a salicylate or Tylenol level, he is more awake on my reassessment and denies any ingestion of either of these-he appears in his usual state of health, he continues to report suicidal ideation.  Lab Tests:  I Ordered, reviewed, and interpreted labs, which included:  CBC: Thrombocytopenia similar to prior.  Mild worsening in his hemoglobin from most recent labs but within prior ranges. Mild leukopenia has had similar in the past @ 3.0.  CMP: Mild hypokalemia- oral replacement ordered.  LFTs similar to prior. Ethanol level: elevated @ 247 consistent w/ clinical intoxication.   CIWA Protocol ordered.  Patient is not  showing signs of acute withdrawal. Medically cleared. TTS consult placed given persistent reports of SI.   Findings & plan of care discussed w/ Dr. Bebe Shaggy- in agreement.  Portions of this note were generated with Scientist, clinical (histocompatibility and immunogenetics). Dictation errors may occur despite best attempts at proofreading.  Final Clinical Impression(s) / ED Diagnoses Final diagnoses:  Alcoholic intoxication without complication [facility])    Rx / DC Orders ED Discharge Orders    None       Cherly Anderson, PA-C 03/08/21 0701    Zadie Rhine, MD 03/09/21 [phone]

## 2021-03-07 NOTE — ED Notes (Signed)
Patient transported to CT 

## 2021-03-08 LAB — COMPREHENSIVE METABOLIC PANEL
ALT: 45 U/L — ABNORMAL HIGH (ref 0–44)
AST: 54 U/L — ABNORMAL HIGH (ref 15–41)
Albumin: 3.9 g/dL (ref 3.5–5.0)
Alkaline Phosphatase: 46 U/L (ref 38–126)
Anion gap: 8 (ref 5–15)
BUN: 8 mg/dL (ref 6–20)
CO2: 28 mmol/L (ref 22–32)
Calcium: 9 mg/dL (ref 8.9–10.3)
Chloride: 104 mmol/L (ref 98–111)
Creatinine, Ser: 0.53 mg/dL — ABNORMAL LOW (ref 0.61–1.24)
GFR, Estimated: >60 mL/min (ref >60)
Glucose, Bld: 87 mg/dL (ref 70–99)
Potassium: 3.3 mmol/L — ABNORMAL LOW (ref 3.5–5.1)
Sodium: 140 mmol/L (ref 135–145)
Total Bilirubin: 0.5 mg/dL (ref 0.3–1.2)
Total Protein: 6.7 g/dL (ref 6.5–8.1)

## 2021-03-08 LAB — CBC
HCT: 39.1 % (ref 39.0–52.0)
HCT: 37.2 % — ABNORMAL LOW (ref 39.0–52.0)
Hemoglobin: 13 g/dL (ref 13.0–17.0)
Hemoglobin: 12.7 g/dL — ABNORMAL LOW (ref 13.0–17.0)
MCH: 29.3 pg (ref 26.0–34.0)
MCH: 33.6 pg (ref 26.0–34.0)
MCHC: 33.2 g/dL (ref 30.0–36.0)
MCHC: 34.1 g/dL (ref 30.0–36.0)
MCV: 88.3 fL (ref 80.0–100.0)
MCV: 98.4 fL (ref 80.0–100.0)
Platelets: 226 10*3/uL (ref 150–400)
Platelets: 106 10*3/uL — ABNORMAL LOW (ref 150–400)
RBC: 4.43 MIL/uL (ref 4.22–5.81)
RBC: 3.78 MIL/uL — ABNORMAL LOW (ref 4.22–5.81)
RDW: 13 % (ref 11.5–15.5)
RDW: 15 % (ref 11.5–15.5)
WBC: 5.4 10*3/uL (ref 4.0–10.5)
WBC: 2.8 10*3/uL — ABNORMAL LOW (ref 4.0–10.5)
nRBC: 0 % (ref 0.0–0.2)
nRBC: 0 % (ref 0.0–0.2)

## 2021-03-08 LAB — RAPID URINE DRUG SCREEN, HOSP PERFORMED
Amphetamines: NOT DETECTED
Barbiturates: NOT DETECTED
Benzodiazepines: POSITIVE — AB
Cocaine: POSITIVE — AB
Opiates: NOT DETECTED
Tetrahydrocannabinol: NOT DETECTED

## 2021-03-08 LAB — ETHANOL: Alcohol, Ethyl (B): 247 mg/dL — ABNORMAL HIGH (ref <10)

## 2021-03-08 MED ORDER — POTASSIUM CHLORIDE CRYS ER 20 MEQ PO TBCR
40.0000 meq | EXTENDED_RELEASE_TABLET | Freq: Once | ORAL | Status: AC
  Administered 2021-03-08: 40 meq via ORAL
  Filled 2021-03-08: qty 2

## 2021-03-08 MED ORDER — GABAPENTIN 300 MG PO CAPS
300.0000 mg | ORAL_CAPSULE | Freq: Three times a day (TID) | ORAL | Status: DC
  Administered 2021-03-08 – 2021-03-09 (×3): 300 mg via ORAL
  Filled 2021-03-08 (×3): qty 1

## 2021-03-08 MED ORDER — ALUM & MAG HYDROXIDE-SIMETH 200-200-20 MG/5ML PO SUSP: 30.0000 mL | Freq: Four times a day (QID) | ORAL | Status: DC | PRN

## 2021-03-08 MED ORDER — ONDANSETRON HCL 4 MG PO TABS
4.0000 mg | ORAL_TABLET | Freq: Three times a day (TID) | ORAL | Status: DC | PRN
  Administered 2021-03-08 – 2021-03-09 (×3): 4 mg via ORAL
  Filled 2021-03-08 (×3): qty 1

## 2021-03-08 MED ORDER — LORAZEPAM 2 MG/ML IJ SOLN
0.0000 mg | Freq: Four times a day (QID) | INTRAMUSCULAR | Status: DC
  Administered 2021-03-08: 2 mg via INTRAVENOUS
  Filled 2021-03-08: qty 1

## 2021-03-08 MED ORDER — SODIUM CHLORIDE 0.9 % IV BOLUS
1000.0000 mL | Freq: Once | INTRAVENOUS | Status: AC
  Administered 2021-03-08: 1000 mL via INTRAVENOUS

## 2021-03-08 MED ORDER — THIAMINE HCL 100 MG/ML IJ SOLN
100.0000 mg | Freq: Every day | INTRAMUSCULAR | Status: DC
  Administered 2021-03-09: 100 mg via INTRAVENOUS
  Filled 2021-03-08: qty 2

## 2021-03-08 MED ORDER — LORAZEPAM 1 MG PO TABS
0.0000 mg | ORAL_TABLET | Freq: Four times a day (QID) | ORAL | Status: DC
  Administered 2021-03-08 (×2): 1 mg via ORAL
  Filled 2021-03-08 (×2): qty 1

## 2021-03-08 MED ORDER — LORAZEPAM 2 MG/ML IJ SOLN: 0.0000 mg | Freq: Two times a day (BID) | INTRAMUSCULAR | Status: DC

## 2021-03-08 MED ORDER — SODIUM CHLORIDE 0.9 % IV BOLUS: 1000.0000 mL | Freq: Once | INTRAVENOUS | Status: DC

## 2021-03-08 MED ORDER — THIAMINE HCL 100 MG PO TABS
100.0000 mg | ORAL_TABLET | Freq: Every day | ORAL | Status: DC
  Administered 2021-03-08: 100 mg via ORAL
  Filled 2021-03-08: qty 1

## 2021-03-08 MED ORDER — LORAZEPAM 1 MG PO TABS: 0.0000 mg | ORAL_TABLET | Freq: Two times a day (BID) | ORAL | Status: DC

## 2021-03-08 NOTE — ED Provider Notes (Signed)
Emergency Medicine Observation Re-evaluation Note  Gary [name] is a 35 y.o. male, seen on rounds today.  Pt initially presented to the ED for complaints of Suicidal Currently, the patient is sleeping comfortably.  Physical Exam  BP 105/80 (BP Location: Left Arm)   Pulse 68   Temp 97.9 F (36.6 C) (Oral)   Resp 16   SpO2 98%  Physical Exam General: NAD    ED Course / MDM  EKG:   I have reviewed the labs performed to date as well as medications administered while in observation.  Recent changes in the last 24 hours include no acute events reported by staff.  Plan  Current plan is for [facility] evaluation. Patient is not under full IVC at this time.   Wynetta Fines, MD 03/08/21 [phone]

## 2021-03-08 NOTE — BH Assessment (Signed)
Comprehensive Clinical Assessment (CCA) Note  03/08/2021 Gary [name] [id number] Disposition: Arvilla Market NP recommends patient be observed and monitored for passive S/I and ongoing alcohol withdrawals.   Flowsheet Row ED from 03/07/2021 in [facility] from 03/06/2021 in [facility] from 02/25/2021 in [facility]  C-SSRS RISK CATEGORY High Risk Error: Q3, 4, or 5 should not be populated when Q2 is No Error: Q3, 4, or 5 should not be populated when Q2 is No    The patient demonstrates the following risk factors for suicide: Chronic risk factors for suicide include: substance use disorder. Acute risk factors for suicide include: social withdrawal/isolation. Protective factors for this patient include: positive social support. Considering these factors, the overall suicide risk at this point appears to be moderate. Patient is not appropriate for outpatient follow up.  Patient presents at [facility] with passive S/I and alcohol intoxication with a BAL of 247 on arrival. Patient's UDS was positive for cocaine and Benzodiazepines. Patient states he is trying to drink himself to death. Patient denies any H/I or AVH. Patient states that he has been drinking heavily for the past few years and is "ready to give up." Patient reports daily alcohol use reporting he consumes up to 6 bottles of wine a day for the last year with last use prior to arrival when he consumed 4 bottles of wine. Patient reports sporadic cocaine use stating he "just did a little yesterday." Patient cannot recall his last use prior to yesterday. Patient states that he is very depressed and he has severe anxiety. He states that he has HIV and he has been homeless for the past three years. Patient states that he has never had any mental health treatment in the past, although has been through a 28 day program for SA issues at [facility] two years ago. Patient  denies having a current OP provider to assist with depression. Patient states he is currently not on any medications for symptom management. Patient reports he is currently unemployed and homeless. Patient states he has limited support in the community. Patient states he "rarely sleeps" reporting 2 to 3 hours a night for the last few months. Patient contnues to report ongoing S/I with a plan "to drink himself to death." Patient is requesting assistance with mental health issues and ongoing SA issues.   On arrival Malin PA writes:Gary [name] is a 35 y.o. male with a history of HIV, alcohol abuse, prior subdural hematoma, & thrombocytopenia who presents to the ED via EMS for evaluation of SI per triage note. Patient reportedly was found drinking multiple bottles of wine at a store, he was stating he was suicidal, kept asking EMS for their scissors, and was transported to the ED. On my assessment the patient is sleeping, he is arousable to painful stimuli but does not answer question.  Level 5 caveat applies secondary to suspected EtOH intoxication.   Patient is alert and oriented, his mood is depressed and his affect is flat. His judgment, insight and impulse control are impaired. His thoughts are organized and his memory intact. He does not appear to be responding to any internal stimuli.   Chief Complaint:  Chief Complaint  Patient presents with  . Suicidal   Visit Diagnosis: Substance induced mood disorder    CCA Screening, Triage and Referral (STR)  Patient Reported Information How did you hear about [place]? Self  Referral name: No data recorded Referral phone number: No  data recorded  Whom do you see for routine medical problems? I don't have a doctor  Practice/Facility Name: No data recorded Practice/Facility Phone Number: No data recorded Name of Contact: No data recorded Contact Number: No data recorded Contact Fax Number: No data recorded Prescriber Name: No data  recorded Prescriber Address (if known): No data recorded  What Is the Reason for Your Visit/Call Today? Ongoing S/I and contnued SA use  How Long Has This Been Causing You Problems? > than 6 months  What Do You Feel Would Help You the Most Today? Alcohol or Drug Use Treatment   Have You Recently Been in Any Inpatient Treatment (Hospital/Detox/Crisis Center/28-Day Program)? No  Name/Location of Program/Hospital:No data recorded How Long Were You There? No data recorded When Were You Discharged? No data recorded  Have You Ever Received Services From [facility] Before? Yes  Who Do You See at [facility]? Pt has been assessed by TTS in the past for similar presentation   Have You Recently Had Any Thoughts About Hurting Yourself? Yes  Are You Planning to Commit Suicide/Harm Yourself At This time? No   Have you Recently Had Thoughts About Hurting Someone [name]? No  Explanation: No data recorded  Have You Used Any Alcohol or Drugs in the Past 24 Hours? Yes  How Long Ago Did You Use Drugs or Alcohol? 0100  What Did You Use and How Much? Pt states he "had 6 bottles of wine"   Do You Currently Have a Therapist/Psychiatrist? No  Name of Therapist/Psychiatrist: No data recorded  Have You Been Recently Discharged From Any Office Practice or Programs? No  Explanation of Discharge From Practice/Program: No data recorded    CCA Screening Triage Referral Assessment Type of Contact: Face-to-Face  Is this Initial or Reassessment? Initial Assessment  Date Telepsych consult ordered in [facility]:  03/08/2021  Time Telepsych consult ordered in [facility]:  0512   Patient Reported Information Reviewed? Yes  Patient Left Without Being Seen? No data recorded Reason for Not Completing Assessment: Patient unable to participate in interview. Currently too impaired.   Collateral Involvement: None noted   Does Patient Have a Automotive engineer Guardian? No data recorded Name and Contact of  Legal Guardian: Self  If Minor and Not Living with Parent(s), Who has Custody? n/a  Is CPS involved or ever been involved? Never  Is APS involved or ever been involved? Never   Patient Determined To Be At Risk for Harm To Self or Others Based on Review of Patient Reported Information or Presenting Complaint? Yes, for Self-Harm  Method: No data recorded Availability of Means: No data recorded Intent: No data recorded Notification Required: No data recorded Additional Information for Danger to Others Potential: No data recorded Additional Comments for Danger to Others Potential: No data recorded Are There Guns or Other Weapons in Your Home? No data recorded Types of Guns/Weapons: No data recorded Are These Weapons Safely Secured?                            No data recorded Who Could Verify You Are Able To Have These Secured: No data recorded Do You Have any Outstanding Charges, Pending Court Dates, Parole/Probation? No data recorded Contacted To Inform of Risk of Harm To Self or Others: Other: Comment (NA)   Location of Assessment: [facility] ED   Does Patient Present under Involuntary Commitment? No  IVC Papers Initial File Date: No data recorded  [place]  of Residence: [place]   Patient Currently Receiving the Following Services: Peer Support Services   Determination of Need: Routine (7 days)   Options For Referral: Outpatient Therapy     CCA Biopsychosocial Intake/Chief Complaint:  Patient presents in [facility] with alcohol intoxication and ongoing S/I  Current Symptoms/Problems: Patient is presenting voluntarily for SI and Alcohol Abuse, patient with a plan to drink himself to death   Patient Reported Schizophrenia/Schizoaffective Diagnosis in Past: No   Strengths: None reported  Preferences: None reported  Abilities: None reported   Type of Services Patient Feels are Needed: None reported   Initial Clinical Notes/Concerns: None   Mental Health  Symptoms Depression:  Change in energy/activity; Fatigue; Hopelessness; Increase/decrease in appetite; Sleep (too much or little); Tearfulness; Worthlessness   Duration of Depressive symptoms: Greater than two weeks   Mania:  None   Anxiety:   Fatigue   Psychosis:  None   Duration of Psychotic symptoms: No data recorded  Trauma:  None   Obsessions:  None   Compulsions:  None   Inattention:  None   Hyperactivity/Impulsivity:  N/A   Oppositional/Defiant Behaviors:  None   Emotional Irregularity:  Chronic feelings of emptiness   Other Mood/Personality Symptoms:  No data recorded   Mental Status Exam Appearance and self-care  Stature:  Average   Weight:  Thin   Clothing:  Disheveled   Grooming:  Neglected   Cosmetic use:  None   Posture/gait:  Slumped   Motor activity:  Slowed   Sensorium  Attention:  Normal   Concentration:  Normal   Orientation:  Person; Place; Situation; Time; Object   Recall/memory:  Normal   Affect and Mood  Affect:  Depressed; Flat   Mood:  Depressed; Hopeless; Worthless   Relating  Eye contact:  Avoided   Facial expression:  Depressed; Sad   Attitude toward examiner:  Cooperative   Thought and Language  Speech flow: Slurred; Slow   Thought content:  Appropriate to Mood and Circumstances   Preoccupation:  Suicide   Hallucinations:  None   Organization:  No data recorded  [employer] of Knowledge:  Fair   Intelligence:  Average   Abstraction:  Normal   Judgement:  Poor   Reality Testing:  Adequate   Insight:  Lacking; Poor   Decision Making:  Impulsive   Social Functioning  Social Maturity:  Isolates   Social Judgement:  Impropriety   Stress  Stressors:  Housing; Office manager Ability:  Exhausted   Skill Deficits:  None   Supports:  Support needed     Religion: Religion/Spirituality Are You A Religious Person?: No  Leisure/Recreation: Leisure / Recreation Do You Have  Hobbies?: No  Exercise/Diet: Exercise/Diet Do You Exercise?: No Have You Gained or Lost A Significant Amount of Weight in the Past Six Months?: No Do You Follow a Special Diet?: No Do You Have Any Trouble Sleeping?: Yes Explanation of Sleeping Difficulties: Patient has difficulty sleeping, slleps 5 hrs per night on average   CCA Employment/Education Employment/Work Situation: Employment / Work Situation Employment situation: Unemployed Patient's job has been impacted by current illness: Yes Describe how patient's job has been impacted: unable to maintain employment What is the longest time patient has a held a job?: Unknown Where was the patient employed at that time?: Unknown Has patient ever been in the [employer]?: No  Education: Education Last Grade Completed: 12 Name of [employer] School: [employer] Did Garment/textile technologist From [employer]?: Yes  Did You Attend College?: No Did You Attend Graduate School?: No Did You Have Any Special Interests In School?:  (UTA) Did You Have An Individualized Education Program (IIEP): No Did You Have Any Difficulty At School?: No   CCA Family/Childhood History Family and Relationship History: Family history Marital status: Single Are you sexually active?: Yes What is your sexual orientation?: Homosexual Has your sexual activity been affected by drugs, alcohol, medication, or emotional stress?: Unknown Does patient have children?: No  Childhood History:  Childhood History By whom was/is the patient raised?: Mother,Father Additional childhood history information: parents divorced Description of patient's relationship with caregiver when they were a child: patient was relative close to his parents growing up Patient's description of current relationship with people who raised him/her: patient states that he currently does not have a close relationship with his parents How were you disciplined when you got in trouble as a child/adolescent?:  None reported Does patient have siblings?: Yes Description of patient's current relationship with siblings: patient states that he is not close to his siblings Did patient suffer any verbal/emotional/physical/sexual abuse as a child?: No Did patient suffer from severe childhood neglect?: No Has patient ever been sexually abused/assaulted/raped as an adolescent or adult?: No Was the patient ever a victim of a crime or a disaster?: No Witnessed domestic violence?: No Has patient been affected by domestic violence as an adult?: No  Child/Adolescent Assessment:     CCA Substance Use Alcohol/Drug Use: Alcohol / Drug Use Pain Medications: See MAR Prescriptions: See MAR Over the Counter: See MAR History of alcohol / drug use?: Yes Longest period of sobriety (when/how long): Unable to quantify Negative Consequences of Use: Financial,Personal relationships,Work / School Withdrawal Symptoms: Blackouts,Change in blood pressure,Irritability,Nausea / Vomiting,Patient aware of relationship between substance abuse and physical/medical complications,Seizures,Sweats Onset of Seizures: Unknown Date of most recent seizure: not assessed Substance #1 Name of Substance 1: Alcohol 1 - Age of First Use: not assessed 1 - Amount (size/oz): 6-7 bottles of wine daily 1 - Frequency: daily 1 - Duration: past few years 1 - Last Use / Amount: 2 bottles of wine and 2 forties 1 - Method of Aquiring: purchase from store                       ASAM's:  Six Dimensions of Multidimensional Assessment  Dimension 1:  Acute Intoxication and/or Withdrawal Potential:   Dimension 1:  Description of individual's past and current experiences of substance use and withdrawal: Patient has a history of withdrawal seizures  Dimension 2:  Biomedical Conditions and Complications:   Dimension 2:  Description of patient's biomedical conditions and  complications: Patient has several medical issues negatively impacted by  his use of alcohol  Dimension 3:  Emotional, Behavioral, or Cognitive Conditions and Complications:  Dimension 3:  Description of emotional, behavioral, or cognitive conditions and complications: Patient states that he is severely depressed and suicidal  Dimension 4:  Readiness to Change:  Dimension 4:  Description of Readiness to Change criteria: Patient states that he is ready to amke positve changes in his life and states that he wants to stop drinking  Dimension 5:  Relapse, Continued use, or Continued Problem Potential:  Dimension 5:  Relapse, continued use, or continued problem potential critiera description: Patient has a history of chronic relapses  Dimension 6:  Recovery/Living Environment:  Dimension 6:  Recovery/Iiving environment criteria description: Patient is hoeless and has little emotional suupport  ASAM Severity Score: ASAM's  Severity Rating Score: 13  ASAM Recommended Level of Treatment: ASAM Recommended Level of Treatment: Level III Residential Treatment   Substance use Disorder (SUD) Substance Use Disorder (SUD)  Checklist Symptoms of Substance Use: Continued use despite having a persistent/recurrent physical/psychological problem caused/exacerbated by use,Continued use despite persistent or recurrent social, interpersonal problems, caused or exacerbated by use,Evidence of tolerance,Evidence of withdrawal (Comment),Large amounts of time spent to obtain, use or recover from the substance(s),Persistent desire or unsuccessful efforts to cut down or control use,Presence of craving or strong urge to use,Recurrent use that results in a failure to fulfill major role obligations (work, school, home),Repeated use in physically hazardous situations,Social, occupational, recreational activities given up or reduced due to use,Substance(s) often taken in larger amounts or over longer times than was intended  Recommendations for Services/Supports/Treatments: Recommendations for  Services/Supports/Treatments Recommendations For Services/Supports/Treatments: Residential-Level 3  DSM5 Diagnoses: Patient Active Problem List   Diagnosis Date Noted  . 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 Elevated troponin 10/19/2020  . Alcohol withdrawal (HCC) 10/19/2020  . Alcohol abuse   . Suicidal ideation 06/14/2020  . Pancreatitis 06/13/2020  . 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  . Malnutrition of moderate degree 05/07/2020  . Gastrointestinal hemorrhage   . Alcoholic hepatitis without ascites   . Melena 05/05/2020  . Alcoholic ketoacidosis 05/05/2020  . Thrombocytopenia (HCC) 07/25/2019  . Transaminitis 07/25/2019  . Traumatic subdural hematoma (HCC) 07/02/2019  . Alcohol abuse with intoxication (HCC) 07/02/2019  . 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 Alcohol use disorder, severe, dependence (HCC) 04/16/2019  . 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 HIV disease (HCC) 06/16/2018  . Polysubstance abuse (HCC) 06/16/2018  . Cigarette smoker 06/16/2018    Patient Centered Plan: Patient is on the following Treatment Plan(s):     Referrals to Alternative Service(s): Referred to Alternative Service(s):   Place:   Date:   Time:    Referred to Alternative Service(s):   Place:   Date:   Time:    Referred to Alternative Service(s):   Place:   Date:   Time:    Referred to Alternative Service(s):   Place:   Date:   Time:     Alfredia FergusonDavid L Jerlyn Pain, LCAS

## 2021-03-08 NOTE — BH Assessment (Signed)
Received TTS consult order. Spoke to [employer], RN who said Pt has received medication and is currently too somnolent to participate in tele-assessment. TTS will complete assessment when Pt is alert.   Pamalee Leyden, [facility], [facility] Triage Specialist [phone]

## 2021-03-09 MED ORDER — LORAZEPAM 2 MG/ML IJ SOLN
0.0000 mg | Freq: Two times a day (BID) | INTRAMUSCULAR | Status: DC
Start: 2021-03-11 — End: 2021-03-09

## 2021-03-09 MED ORDER — LORAZEPAM 2 MG/ML IJ SOLN
0.0000 mg | Freq: Four times a day (QID) | INTRAMUSCULAR | Status: DC
  Administered 2021-03-09 (×2): 2 mg via INTRAVENOUS
  Filled 2021-03-09 (×2): qty 1

## 2021-03-09 MED ORDER — LORAZEPAM 1 MG PO TABS: 0.0000 mg | ORAL_TABLET | Freq: Two times a day (BID) | ORAL | Status: DC

## 2021-03-09 MED ORDER — LORAZEPAM 1 MG PO TABS
0.0000 mg | ORAL_TABLET | Freq: Four times a day (QID) | ORAL | Status: DC
  Filled 2021-03-09: qty 2

## 2021-03-09 NOTE — Discharge Instructions (Signed)
[facility] - Admissions are currently completed Monday through Friday at 8am; both appointments and walk-ins are accepted.  Any individual that is a [place] resident may present for a substance abuse screening and assessment for admission.  A person may be referred by numerous sources or self-refer.   Potential clients will be screened for medical necessity and appropriateness for the program.  Clients must meet criteria for high-intensity residential treatment services.  If clinically appropriate, a client will continue with the comprehensive clinical assessment and intake process, as well as enrollment in the [facility].  Address: [address] [place], [place] [address] Admin Hours: Mon-Fri 8AM to 5PM Center Hours: 24/7 Phone: [phone] Fax: [phone]  [facility] (Detox) Facility Based Crisis:  These are 3 locations for services: Please call before arrival   Address: [address] [place], [place] [address] Phone: [phone]  Address: [address], [place], [place] [address] Phone#: [phone]  Address: [address], [place], [place] [address] Phone#: [phone]   [facility]): (offers outpatient therapy and intensive outpatient substance abuse therapy).  [address], [place], [place] [address] Phone: [phone]  [facility]: Offers FREE recovery skills classes, support groups, 1:1 Peer Support, and Compeer Classes. [address], [place], [place] [address] Phone: [phone] (Call to complete intake).  [facility] [address] [place], [place] [address] Phone: [phone] ext: [phone] [facility] provides food, shelter and other programs and services to the homeless men of [place] through our men's program.  By offering safe shelter, three meals a day, clean clothing, Biblical counseling,  financial planning, vocational training, GED/education and employment assistance, we've helped mend the shattered lives of many homeless men since opening in 1974.  We have approximately 267 beds available, with a max of 312 beds including mats for emergency situations and currently house an average of 270 men a night.  Prospective Client Check-In Information Photo ID Required (State/ Out of State/ DOC) - if photo ID is not available, clients are required to have a printout of a police/sheriff's criminal history report. Help out with chores around the Mission. No sex offender of any type (pending, charged, registered and/or any other sex related offenses) will be permitted to check in. Must be willing to abide by all rules, regulations, and policies established by the [facility]. The following will be provided - shelter, food, clothing, and biblical counseling. If you or someone you know is in need of assistance at our men's shelter in [place], [place], please call [phone] ext. [phone].  [place] [facility]-will provide timely access to mental health services for children and adolescents (4-17) and adults presenting in a mental health crisis. The program is designed for those who need urgent Behavioral Health or Substance Use treatment and are not experiencing a medical crisis that would typically require an emergency room visit.    [address] [place], [place] [address] Phone: [phone] [email or web address]  [facility]: Phone#: [phone]  The [facility] SA Intensive Outpatient Program (SAIOP) means structured individual and group addiction activities and services that are provided at an outpatient program designed to assist adult and adolescent consumers to begin recovery and learn skills for recovery maintenance. The [facility] SAIOP program is offered at least 3 hours a day, 3 days a week.SAIOP services shall  include a structured program consisting of,   but not limited to, the following services: Individual counseling and support; Group counseling and support; Family counseling, training or support; Biochemical assays to identify recent drug use (e.g., urine drug screens); Strategies for relapse prevention to include community and social support systems in treatment; Life skills; Crisis contingency planning; Disease Management; and Treatment support activities that have been adapted or specifically designed for persons with physical disabilities, or persons with co-occurring disorders of mental illness and substance abuse/dependence or mental retardation/developmental disability and substance abuse/dependence. Phone: [phone]  Address:   [facility] [place] [facility] will also offer the following outpatient services: (Monday through Friday 8am-5pm)    Partial Hospitalization Program (PHP)  Substance Abuse Intensive Outpatient Program (SA-IOP)  Group Therapy  Medication Management  Peer Living Room We also provide (24/7):  Assessments: Our mental health clinician and providers will conduct a focused mental health evaluation, assessing for immediate safety concerns and further mental health needs. Referral: Our team will provide resources and help connect to community based mental health treatment, when indicated, including psychotherapy, psychiatry, and other specialized behavioral health or substance use disorder services (for those not already in treatment). Transitional Care: Our team providers in person bridging and/or telephonic follow-up during the patient's transition to outpatient services.  [facility] 24-Hour Call Center: [phone] Behavioral Health Crisis Line: [phone]  

## 2021-03-09 NOTE — [facility] Suicide Risk Assessment (Cosign Needed Addendum)
Suicide Risk Assessment  Discharge Assessment   [facility] Discharge Suicide Risk Assessment   Principal Problem: Alcohol-induced mood disorder [facility]) Discharge Diagnoses: Principal Problem:   Alcohol-induced mood disorder (HCC) Active Problems:   Polysubstance abuse (HCC)   Major depressive disorder, recurrent severe without psychotic features (HCC)   Total Time spent with patient: 30 minutes   Gary [name], 35 y.o., male patient presented to [facility]  Patient seen via telepsych by this provider; chart reviewed and consulted with Dr. Lucianne Muss on 03/09/21.  On evaluation Gary [name] reports he is no longer suicidal but wants "detox from alcohol"  He reports last inpatient substance treatment was January 2022 unfortunately was unable to maintain his sobriety and began using again.  States he drink alcohol and using poly-substances, usually feels depressed and has suicidal ideations when coming down off his high.  Suicidal ideations improve after sleep and clearance of substances.  He reports a hx for depression, not sure if substance use or depression came first but he is not taking antidepressant medication. Defers starting antidepressants today due to intermittent nausea.     On admission his BAL was 247; UDS completed on 4/29 demonstrates +benzodiazepines and cocaine.  He was placed on CIWA protocol for withdrawals.  He has had withdrawal nausea, tremors initially managed with Ativan but later changed to Gabapentin due to hx of polysubstance abuse.  He has been cooperative with staff, and compliant with medications.  Has had expected nausea managed with zofran, appetite fair.  States he was able to sleep last night.     Musculoskeletal:  Patient sitting upright on hospital gurney.  Limited physical assessment due to video; no gross or obvious impairments observed.    Psychiatric Specialty Exam: @ROSBYAGE @  Blood pressure 109/75, pulse 65, temperature 98.7 F (37.1 C), temperature source  Oral, resp. rate 20, SpO2 97 %.There is no height or weight on file to calculate BMI.  General Appearance: Fairly Groomed and Guarded  ::  Good  Speech:  Clear and Coherent and Normal Rate  Volume:  Normal  Mood:  Anxious and Dysphoric  Affect:  Congruent and Constricted  Thought Process:  Coherent  Orientation:  Full (Time, Place, and Person)  Thought Content:  Logical and improved since admission with rest  Suicidal Thoughts:  improved since admission  Homicidal Thoughts:  No  Memory:  Immediate;   Good Recent;   Good Remote;   Good  Judgement:  Fair, in the setting of chronic substance abuse  Insight:  Lacking, in the setting of chronic substance abuse  Psychomotor Activity:  Normal  Concentration:  Good  Recall:  Good  Fund of Knowledge:Fair  Language: Good  Akathisia:  NA  Handed:  Right  AIMS (if indicated):     Assets:  [id number] Resources/Insurance Resilience  Sleep:   <6 hours  Cognition: WNL  ADL's:  Intact   Mental Status Per Nursing Assessment::   On Admission:     Demographic Factors:  Male and Low socioeconomic status  Loss Factors: NA  Historical Factors: Impulsivity  Risk Reduction Factors:   Sense of responsibility to family  Continued Clinical Symptoms:  Depression:   Impulsivity Alcohol/Substance Abuse/Dependencies  Cognitive Features That Contribute To Risk:  None    Suicide Risk:  Mild:  Suicidal ideation of limited frequency, intensity, duration, and specificity.  There are no identifiable plans, no associated intent, mild dysphoria and related symptoms, good self-control (both objective and subjective assessment), few other risk factors,  and identifiable protective factors, including available and accessible social support.   Plan Of Care/Follow-up recommendations: Patient is psych cleared.  Plan- As per above assessment, there are no current grounds for involuntary commitment at this time.?  He is  future oriented and relates his plans for substance treatment and sobriety.   Patient is not currently interested in inpatient services, but expresses agreement to continue with referral to inpatient substance abuse treatment facility - we have reviewed importance of substance abuse abstinence, potential negative impact substance abuse can have on his relationships and level of functioning, and importance of medication compliance. ?  SW Crissie Reese, to provider referral resources for inpatient detox treatment prior to discharge.    Spoke with Dr. Freida Busman and RN Vivi Ferns via chat to update disposition status; informed of above recommendation and disposition.   Patient location: [facility] ED Provider Location: [facility] TTS department; patient seen virtually  Chales Abrahams, NP 03/09/2021, 12:33 PM

## 2021-03-09 NOTE — Progress Notes (Signed)
CSW provided substance abuse resources listed below. CSW reached out to the patient's nurse in regards to the patient possbly being interested in receiving detox treatment at [facility], however the patient's nurse advise via secure chat that the patient decline services at [facility].   Resources Provided:   [facility] - Admissions are currently completed Monday through Friday at 8am; both appointments and walk-ins are accepted.  Any individual that is a [facility] resident may present for a substance abuse screening and assessment for admission.  A person may be referred by numerous sources or self-refer.   Potential clients will be screened for medical necessity and appropriateness for the program.  Clients must meet criteria for high-intensity residential treatment services.  If clinically appropriate, a client will continue with the comprehensive clinical assessment and intake process, as well as enrollment in the [facility].  Address: [address] [place], [place] [address] Admin Hours: Mon-Fri 8AM to [facility] Hours: 24/7 Phone: [phone] Fax: [phone]  [facility] (Detox) Facility Based Crisis:  These are 3 locations for services: Please call before arrival   Address: [address] [place], [place] [address] Phone: [phone]  Address: [address] [place], [place] [address] Phone#: [phone]  Address: [address] [place], [place] [address] Phone#: [phone]   [facility]): (offers outpatient therapy and intensive outpatient substance abuse therapy).  [address] [place], [place] [address] Phone: [phone]  [facility]: Offers FREE recovery skills classes, support groups, 1:1 Peer Support, and Compeer Classes. [address], [place], [place] [address] Phone: [phone] (Call to complete intake).  [facility] [address] [place], [place] [address] Phone: [phone] ext: [phone] [facility] provides food, shelter and other programs and services to the homeless men of [place] through our [employer].  By offering safe shelter, three meals a day, clean clothing, Biblical counseling, financial planning, vocational training, GED/education and employment assistance, we've helped mend the shattered lives of many homeless men since opening in 1974.  We have approximately 267 beds available, with a max of 312 beds including mats for emergency situations and currently house an average of 270 men a night.  Prospective Client Check-In Information Photo ID Required (State/ Out of State/ [facility]) - if photo ID is not available, clients are required to have a printout of a police/sheriff's criminal history report. Help out with chores around the [facility]. No sex offender of any type (pending, charged, registered and/or any other sex related offenses) will be permitted to check in. Must be willing to abide by all rules, regulations, and policies established by the [employer]. The following will be provided - shelter, food, clothing, and biblical counseling. If you or someone you know is in need of assistance at our [facility] shelter in [place], [place], please call [phone] ext. [phone].  [facility]-will provide timely access to mental health services for children and adolescents (4-17) and adults presenting in a mental health crisis. The program is designed for those who need urgent Behavioral Health or Substance Use treatment and are not experiencing a medical crisis that would typically require an emergency room visit.    [address] [place], [place] [address] Phone: [phone] [email or web address]  [facility]: Phone#: [phone]  The [facility] SA Intensive Outpatient Program (SAIOP) means structured individual  and group addiction activities and  services that are provided at an outpatient program designed to assist adult and adolescent consumers to begin recovery and learn skills for recovery maintenance. The [facility] SAIOP program is offered at least 3 hours a day, 3 days a week.SAIOP services shall include a structured program consisting of, but not limited to, the following services: Individual counseling and support; Group counseling and support; Family counseling, training or support; Biochemical assays to identify recent drug use (e.g., urine drug screens); Strategies for relapse prevention to include community and social support systems in treatment; Life skills; Crisis contingency planning; Disease Management; and Treatment support activities that have been adapted or specifically designed for persons with physical disabilities, or persons with co-occurring disorders of mental illness and substance abuse/dependence or mental retardation/developmental disability and substance abuse/dependence. Phone: [phone]  Address:   [facility] will also offer the following outpatient services: (Monday through Friday 8am-5pm)    Partial Hospitalization Program (PHP)  Substance Abuse Intensive Outpatient Program (SA-IOP)  Group Therapy  Medication Management  Peer Living Room We also provide (24/7):  Assessments: Our mental health clinician and providers will conduct a focused mental health evaluation, assessing for immediate safety concerns and further mental health needs. Referral: Our team will provide resources and help connect to community based mental health treatment, when indicated, including psychotherapy, psychiatry, and other specialized behavioral health or substance use disorder services (for those not already in treatment). Transitional Care: Our team providers in person bridging and/or telephonic follow-up during the patient's transition to outpatient services.  [facility] 24-Hour Call Center: [phone] Behavioral Health Crisis Line: [phone]  Crissie Reese, MSW, LCSW-A, [place] Phone: [phone] Disposition/TOC

## 2021-03-09 NOTE — ED Provider Notes (Signed)
Emergency Medicine Observation Re-evaluation Note  Gary [name] is a 35 y.o. male, seen on rounds today.  Pt initially presented to the ED for complaints of Suicidal Currently, the patient is feeling better.  Is no longer suicidal.  Physical Exam  BP 109/75 (BP Location: Left Arm)   Pulse 65   Temp 98.7 F (37.1 C) (Oral)   Resp 20   SpO2 97%  Physical Exam   ED Course / MDM  EKG:   I have reviewed the labs performed to date as well as medications administered while in observation.  Recent changes in the last 24 hours include none  Plan  Current plan is for reassessment by psychiatry and likely discharge as patient no longer suicidal    Lorre Nick, MD 03/09/21 [phone]

## 2021-03-11 ENCOUNTER — Emergency Department (HOSPITAL_COMMUNITY): Payer: Self-pay

## 2021-03-11 ENCOUNTER — Inpatient Hospital Stay (HOSPITAL_COMMUNITY): Payer: Self-pay

## 2021-03-11 ENCOUNTER — Encounter (HOSPITAL_COMMUNITY): Payer: Self-pay | Admitting: Internal Medicine

## 2021-03-11 ENCOUNTER — Inpatient Hospital Stay (HOSPITAL_COMMUNITY)
Admission: EM | Admit: 2021-03-11 | Discharge: 2021-03-19 | DRG: 897 | Disposition: A | Discharge status: Home / Self Care | Payer: Self-pay | Attending: Internal Medicine | Admitting: Internal Medicine

## 2021-03-11 DIAGNOSIS — F10139 Alcohol abuse with withdrawal, unspecified: Secondary | ICD-10-CM | POA: Diagnosis present

## 2021-03-11 DIAGNOSIS — G4089 Other seizures: Secondary | ICD-10-CM | POA: Diagnosis present

## 2021-03-11 DIAGNOSIS — L89899 Pressure ulcer of other site, unspecified stage: Secondary | ICD-10-CM | POA: Diagnosis present

## 2021-03-11 DIAGNOSIS — R569 Unspecified convulsions: Secondary | ICD-10-CM

## 2021-03-11 DIAGNOSIS — Z5900 Homelessness unspecified: Secondary | ICD-10-CM

## 2021-03-11 DIAGNOSIS — Y908 Blood alcohol level of 240 mg/100 ml or more: Secondary | ICD-10-CM | POA: Diagnosis present

## 2021-03-11 DIAGNOSIS — D649 Anemia, unspecified: Secondary | ICD-10-CM

## 2021-03-11 DIAGNOSIS — L8990 Pressure ulcer of unspecified site, unspecified stage: Secondary | ICD-10-CM

## 2021-03-11 DIAGNOSIS — F1410 Cocaine abuse, uncomplicated: Secondary | ICD-10-CM | POA: Diagnosis present

## 2021-03-11 DIAGNOSIS — F333 Major depressive disorder, recurrent, severe with psychotic symptoms: Secondary | ICD-10-CM

## 2021-03-11 DIAGNOSIS — Z811 Family history of alcohol abuse and dependence: Secondary | ICD-10-CM

## 2021-03-11 DIAGNOSIS — D539 Nutritional anemia, unspecified: Secondary | ICD-10-CM | POA: Diagnosis present

## 2021-03-11 DIAGNOSIS — F332 Major depressive disorder, recurrent severe without psychotic features: Secondary | ICD-10-CM | POA: Diagnosis present

## 2021-03-11 DIAGNOSIS — I9589 Other hypotension: Secondary | ICD-10-CM | POA: Diagnosis not present

## 2021-03-11 DIAGNOSIS — B20 Human immunodeficiency virus [HIV] disease: Secondary | ICD-10-CM | POA: Diagnosis present

## 2021-03-11 DIAGNOSIS — Z20822 Contact with and (suspected) exposure to covid-19: Secondary | ICD-10-CM | POA: Diagnosis present

## 2021-03-11 DIAGNOSIS — Z79899 Other long term (current) drug therapy: Secondary | ICD-10-CM

## 2021-03-11 DIAGNOSIS — K760 Fatty (change of) liver, not elsewhere classified: Secondary | ICD-10-CM | POA: Diagnosis present

## 2021-03-11 DIAGNOSIS — F1510 Other stimulant abuse, uncomplicated: Secondary | ICD-10-CM | POA: Diagnosis present

## 2021-03-11 DIAGNOSIS — R7401 Elevation of levels of liver transaminase levels: Secondary | ICD-10-CM

## 2021-03-11 DIAGNOSIS — E876 Hypokalemia: Secondary | ICD-10-CM | POA: Diagnosis present

## 2021-03-11 DIAGNOSIS — F10230 Alcohol dependence with withdrawal, uncomplicated: Secondary | ICD-10-CM

## 2021-03-11 DIAGNOSIS — D638 Anemia in other chronic diseases classified elsewhere: Secondary | ICD-10-CM | POA: Diagnosis present

## 2021-03-11 DIAGNOSIS — D61818 Other pancytopenia: Secondary | ICD-10-CM | POA: Diagnosis present

## 2021-03-11 DIAGNOSIS — F10930 Alcohol use, unspecified with withdrawal, uncomplicated: Secondary | ICD-10-CM

## 2021-03-11 DIAGNOSIS — F10251 Alcohol dependence with alcohol-induced psychotic disorder with hallucinations: Secondary | ICD-10-CM | POA: Diagnosis present

## 2021-03-11 DIAGNOSIS — F10239 Alcohol dependence with withdrawal, unspecified: Principal | ICD-10-CM | POA: Diagnosis present

## 2021-03-11 DIAGNOSIS — F419 Anxiety disorder, unspecified: Secondary | ICD-10-CM | POA: Diagnosis present

## 2021-03-11 DIAGNOSIS — F17210 Nicotine dependence, cigarettes, uncomplicated: Secondary | ICD-10-CM | POA: Diagnosis present

## 2021-03-11 LAB — CBC WITH DIFFERENTIAL/PLATELET
Abs Immature Granulocytes: 0.01 10*3/uL (ref 0.00–0.07)
Basophils Absolute: 0 10*3/uL (ref 0.0–0.1)
Basophils Relative: 1 %
Eosinophils Absolute: 0.1 10*3/uL (ref 0.0–0.5)
Eosinophils Relative: 3 %
HCT: 38.8 % — ABNORMAL LOW (ref 39.0–52.0)
Hemoglobin: 13.2 g/dL (ref 13.0–17.0)
Immature Granulocytes: 0 %
Lymphocytes Relative: 38 %
Lymphs Abs: 1.5 10*3/uL (ref 0.7–4.0)
MCH: 34.1 pg — ABNORMAL HIGH (ref 26.0–34.0)
MCHC: 34 g/dL (ref 30.0–36.0)
MCV: 100.3 fL — ABNORMAL HIGH (ref 80.0–100.0)
Monocytes Absolute: 0.4 10*3/uL (ref 0.1–1.0)
Monocytes Relative: 10 %
Neutro Abs: 2 10*3/uL (ref 1.7–7.7)
Neutrophils Relative %: 48 %
Platelets: 128 10*3/uL — ABNORMAL LOW (ref 150–400)
RBC: 3.87 MIL/uL — ABNORMAL LOW (ref 4.22–5.81)
RDW: 14.4 % (ref 11.5–15.5)
WBC: 4.1 10*3/uL (ref 4.0–10.5)
nRBC: 0 % (ref 0.0–0.2)

## 2021-03-11 LAB — CBG MONITORING, ED: Glucose-Capillary: 92 mg/dL (ref 70–99)

## 2021-03-11 LAB — RAPID URINE DRUG SCREEN, HOSP PERFORMED
Amphetamines: NOT DETECTED
Barbiturates: NOT DETECTED
Benzodiazepines: POSITIVE — AB
Cocaine: NOT DETECTED
Opiates: NOT DETECTED
Tetrahydrocannabinol: NOT DETECTED

## 2021-03-11 LAB — CBC
HCT: 35.3 % — ABNORMAL LOW (ref 39.0–52.0)
Hemoglobin: 11.8 g/dL — ABNORMAL LOW (ref 13.0–17.0)
MCH: 33.8 pg (ref 26.0–34.0)
MCHC: 33.4 g/dL (ref 30.0–36.0)
MCV: 101.1 fL — ABNORMAL HIGH (ref 80.0–100.0)
Platelets: 101 10*3/uL — ABNORMAL LOW (ref 150–400)
RBC: 3.49 MIL/uL — ABNORMAL LOW (ref 4.22–5.81)
RDW: 14.5 % (ref 11.5–15.5)
WBC: 2.9 10*3/uL — ABNORMAL LOW (ref 4.0–10.5)
nRBC: 0 % (ref 0.0–0.2)

## 2021-03-11 LAB — COMPREHENSIVE METABOLIC PANEL
ALT: 46 U/L — ABNORMAL HIGH (ref 0–44)
AST: 67 U/L — ABNORMAL HIGH (ref 15–41)
Albumin: 4 g/dL (ref 3.5–5.0)
Alkaline Phosphatase: 44 U/L (ref 38–126)
Anion gap: 12 (ref 5–15)
BUN: 6 mg/dL (ref 6–20)
CO2: 23 mmol/L (ref 22–32)
Calcium: 8.8 mg/dL — ABNORMAL LOW (ref 8.9–10.3)
Chloride: 104 mmol/L (ref 98–111)
Creatinine, Ser: 0.71 mg/dL (ref 0.61–1.24)
GFR, Estimated: >60 mL/min (ref >60)
Glucose, Bld: 87 mg/dL (ref 70–99)
Potassium: 3.3 mmol/L — ABNORMAL LOW (ref 3.5–5.1)
Sodium: 139 mmol/L (ref 135–145)
Total Bilirubin: 0.4 mg/dL (ref 0.3–1.2)
Total Protein: 7 g/dL (ref 6.5–8.1)

## 2021-03-11 LAB — PHOSPHORUS: Phosphorus: 3.9 mg/dL (ref 2.5–4.6)

## 2021-03-11 LAB — RESP PANEL BY RT-PCR (FLU A&B, COVID) ARPGX2
Influenza A by PCR: NEGATIVE
Influenza B by PCR: NEGATIVE
SARS Coronavirus 2 by RT PCR: NEGATIVE

## 2021-03-11 LAB — MAGNESIUM: Magnesium: 2.2 mg/dL (ref 1.7–2.4)

## 2021-03-11 LAB — ETHANOL: Alcohol, Ethyl (B): 396 mg/dL (ref <10)

## 2021-03-11 IMAGING — CR DG FOOT COMPLETE 3+V*R*
3 series · 3 of 3 positions shown · non-contrast
Comparison: [DATE]

CLINICAL DATA: Bilateral foot ulcerations.

EXAM:
RIGHT FOOT COMPLETE - 3+ VIEW

[foot lat]
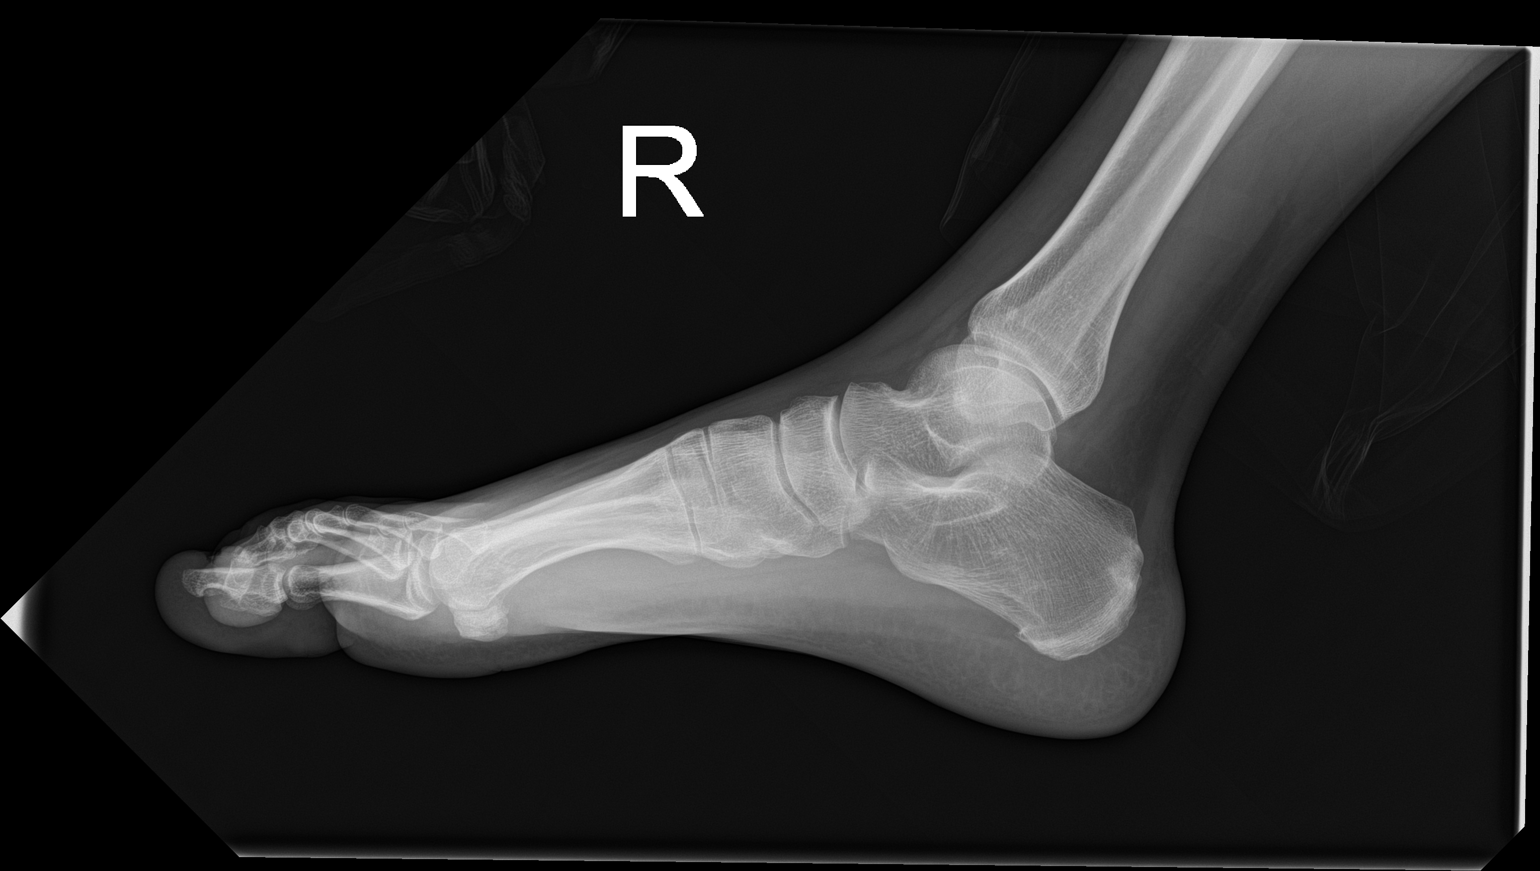

[foot obl]
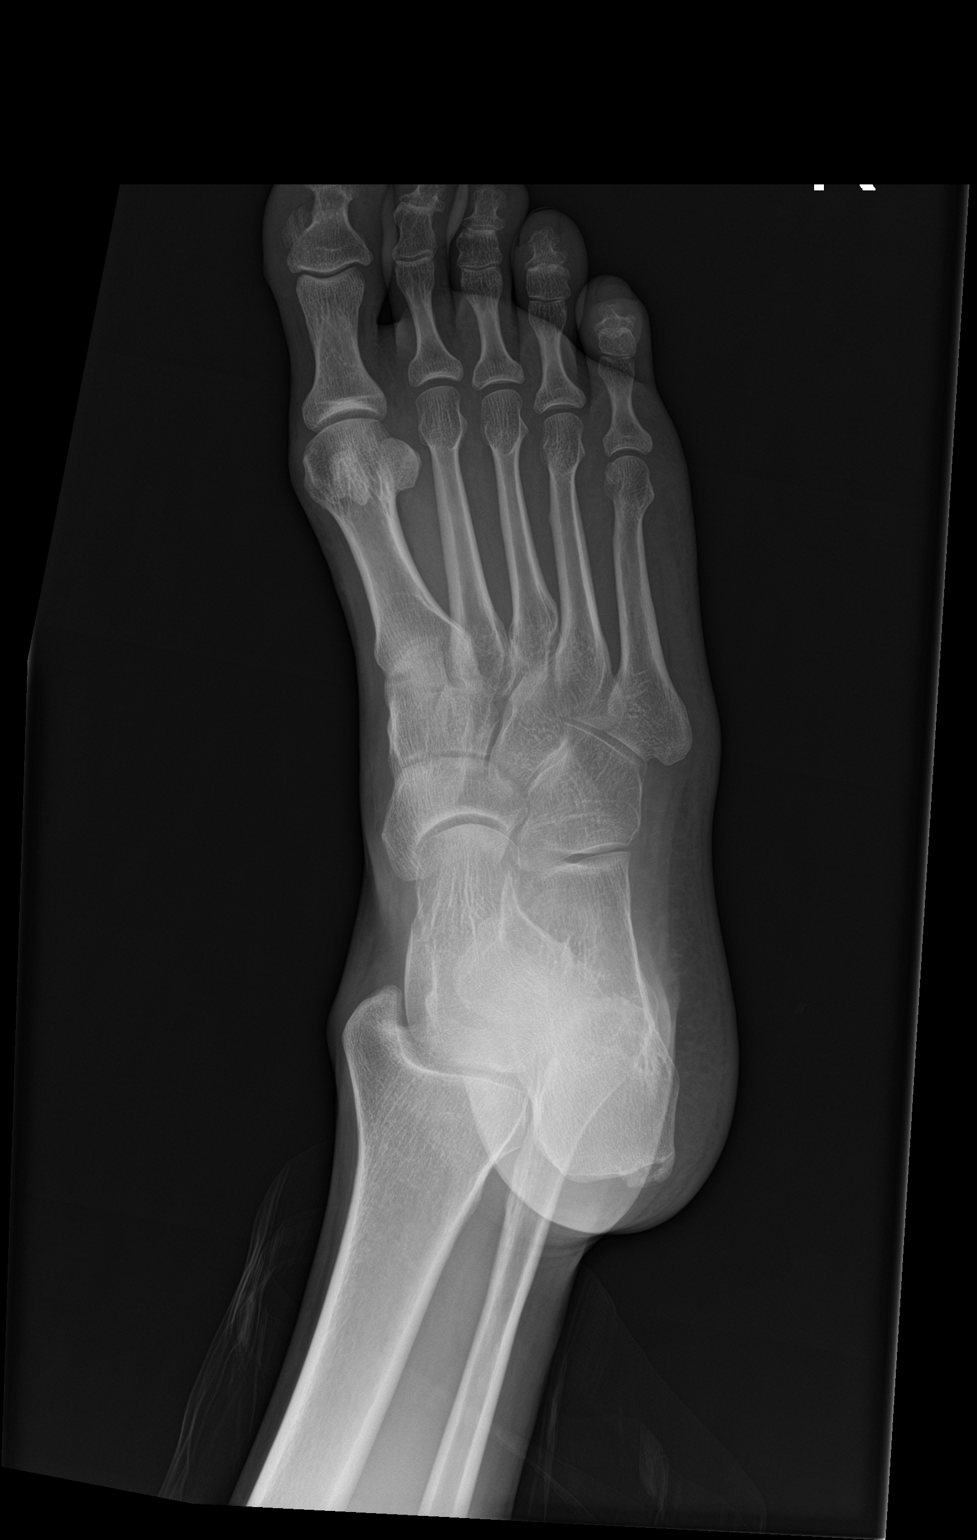

[foot ap]
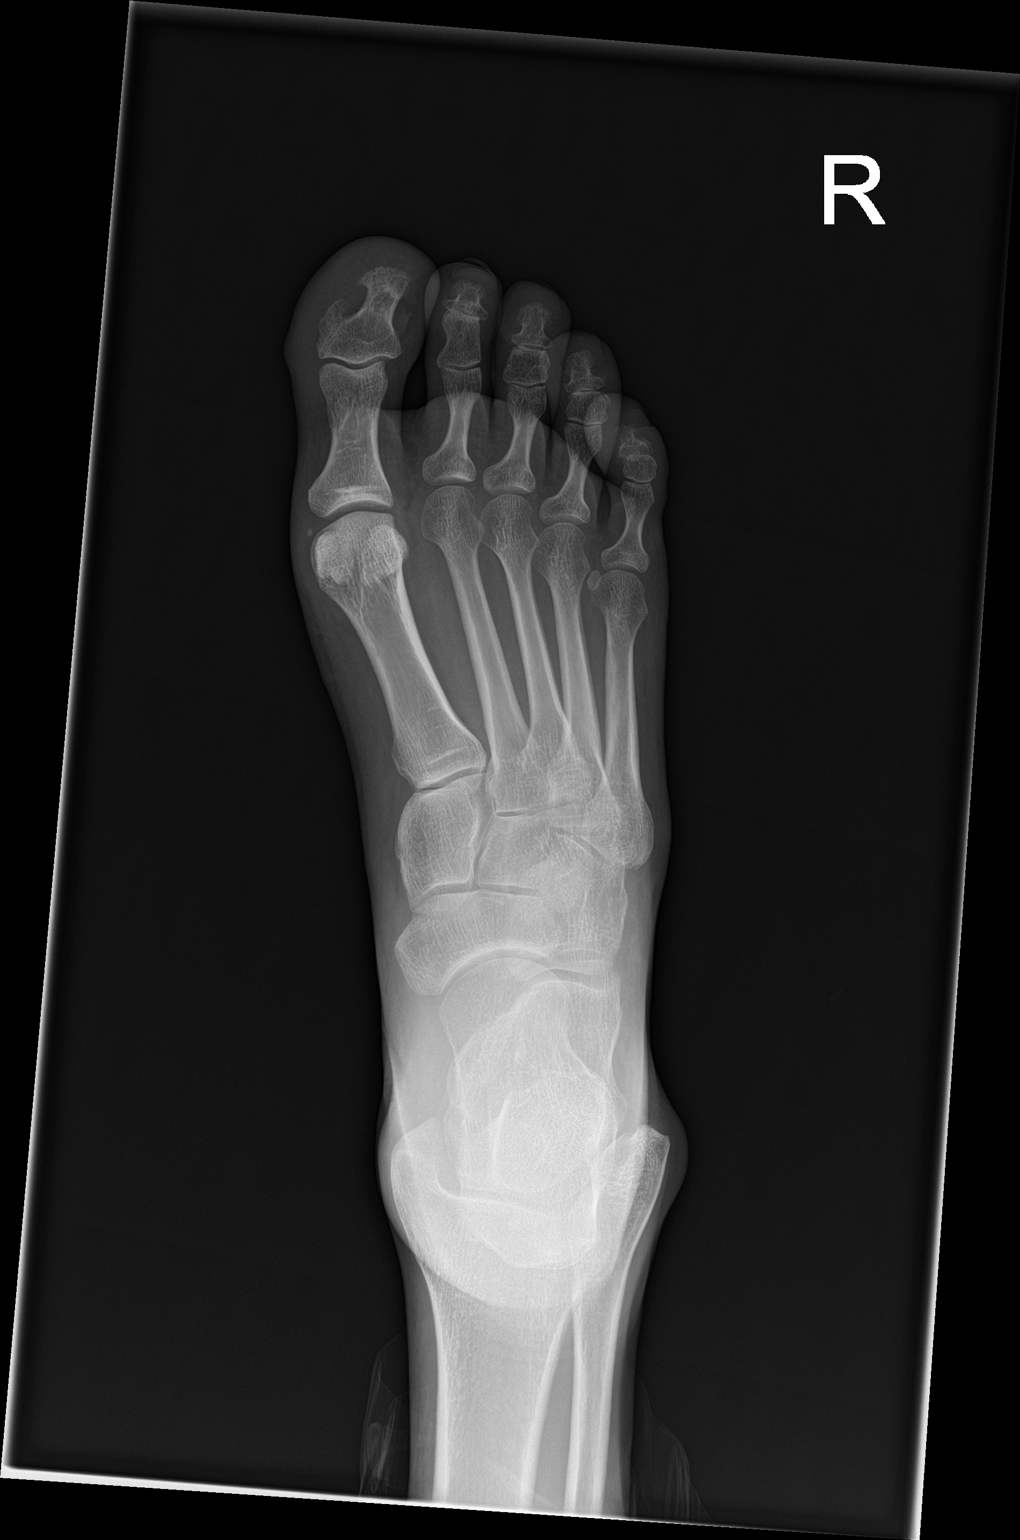

[3 of 3 positions shown; findings below may reference images not displayed]

FINDINGS: Plantar and Achilles calcaneal spurs. Stable faint sclerotic band
along the calcaneus, probably incidental. Boehler's angle 31
degrees, within normal limits.

Normal alignment at the Lisfranc joint. The metatarsals and
phalanges appear intact. No significant midfoot abnormality is
observed.

No gas in the soft tissues.  No foreign body observed.
IMPRESSION: 1. Small plantar Achilles calcaneal spurs.

## 2021-03-11 IMAGING — CR DG FOOT COMPLETE 3+V*L*
3 series · 3 of 3 positions shown · non-contrast
Comparison: None.

CLINICAL DATA: Bilateral foot ulcerations.

EXAM:
LEFT FOOT - COMPLETE 3+ VIEW

[foot ap]
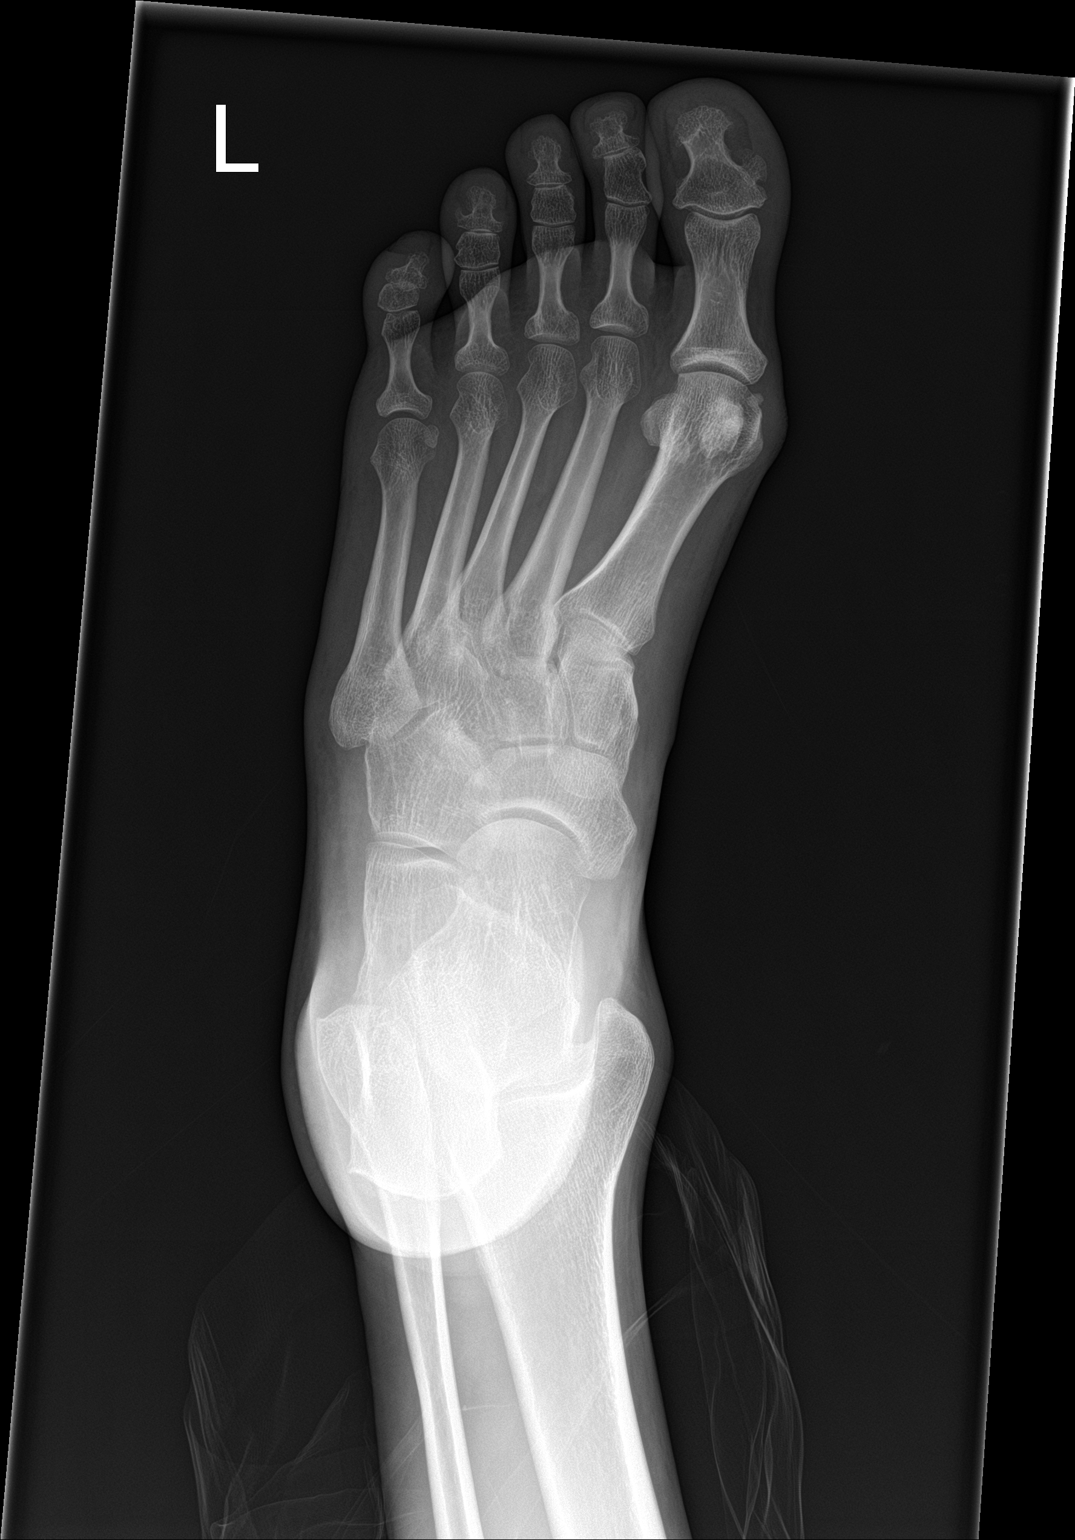

[foot obl]
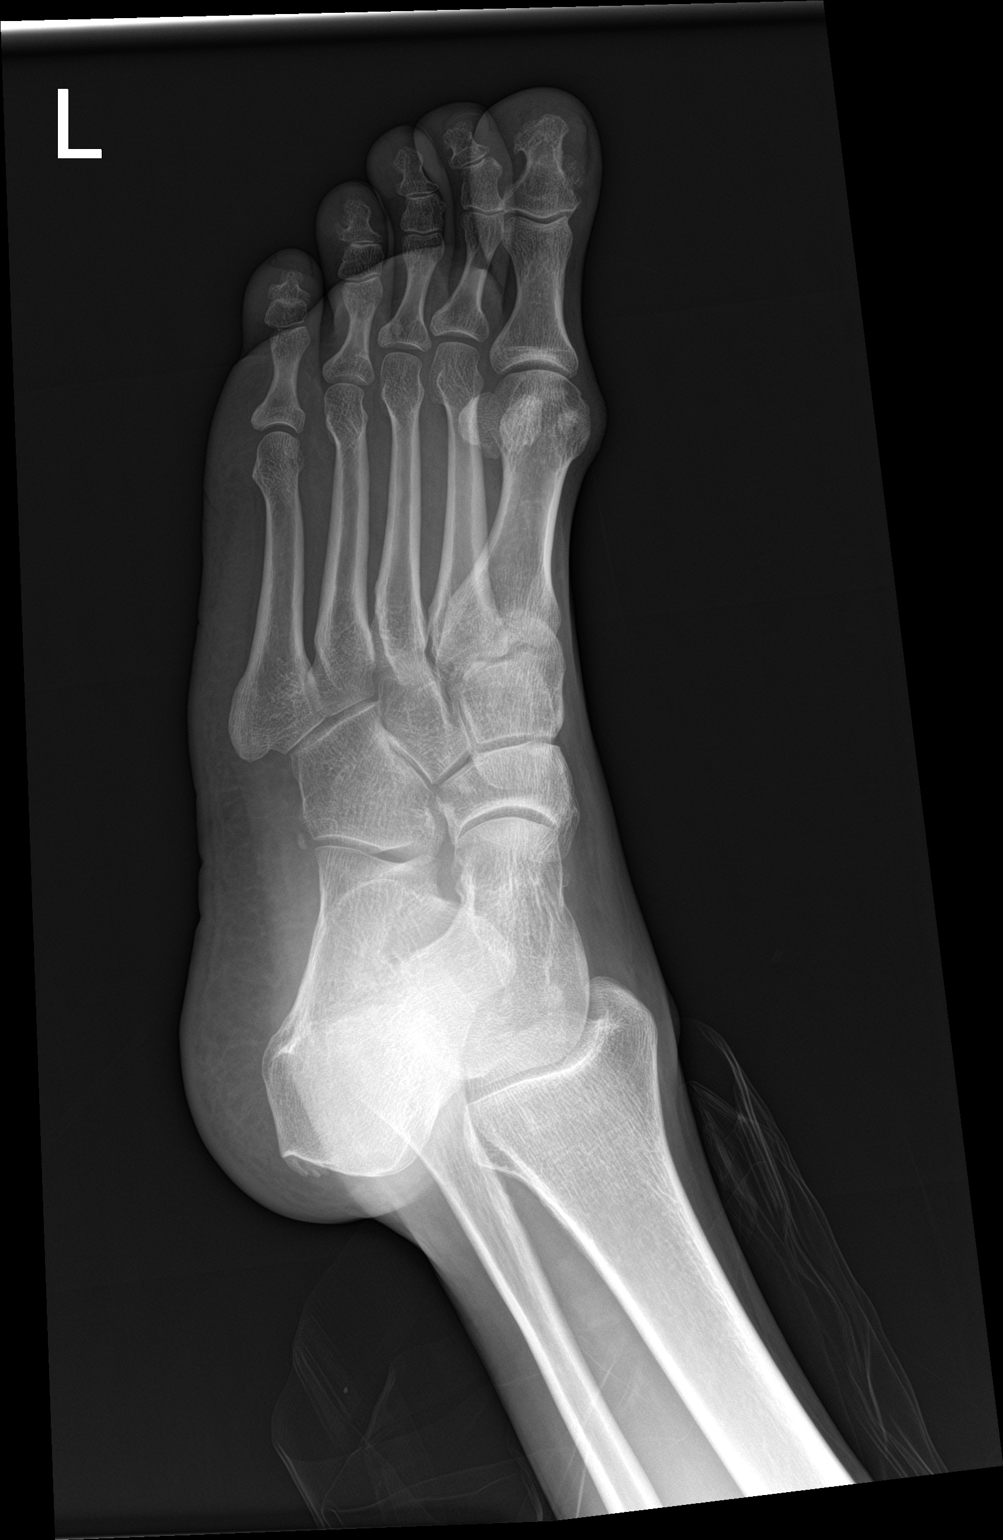

[foot lat]
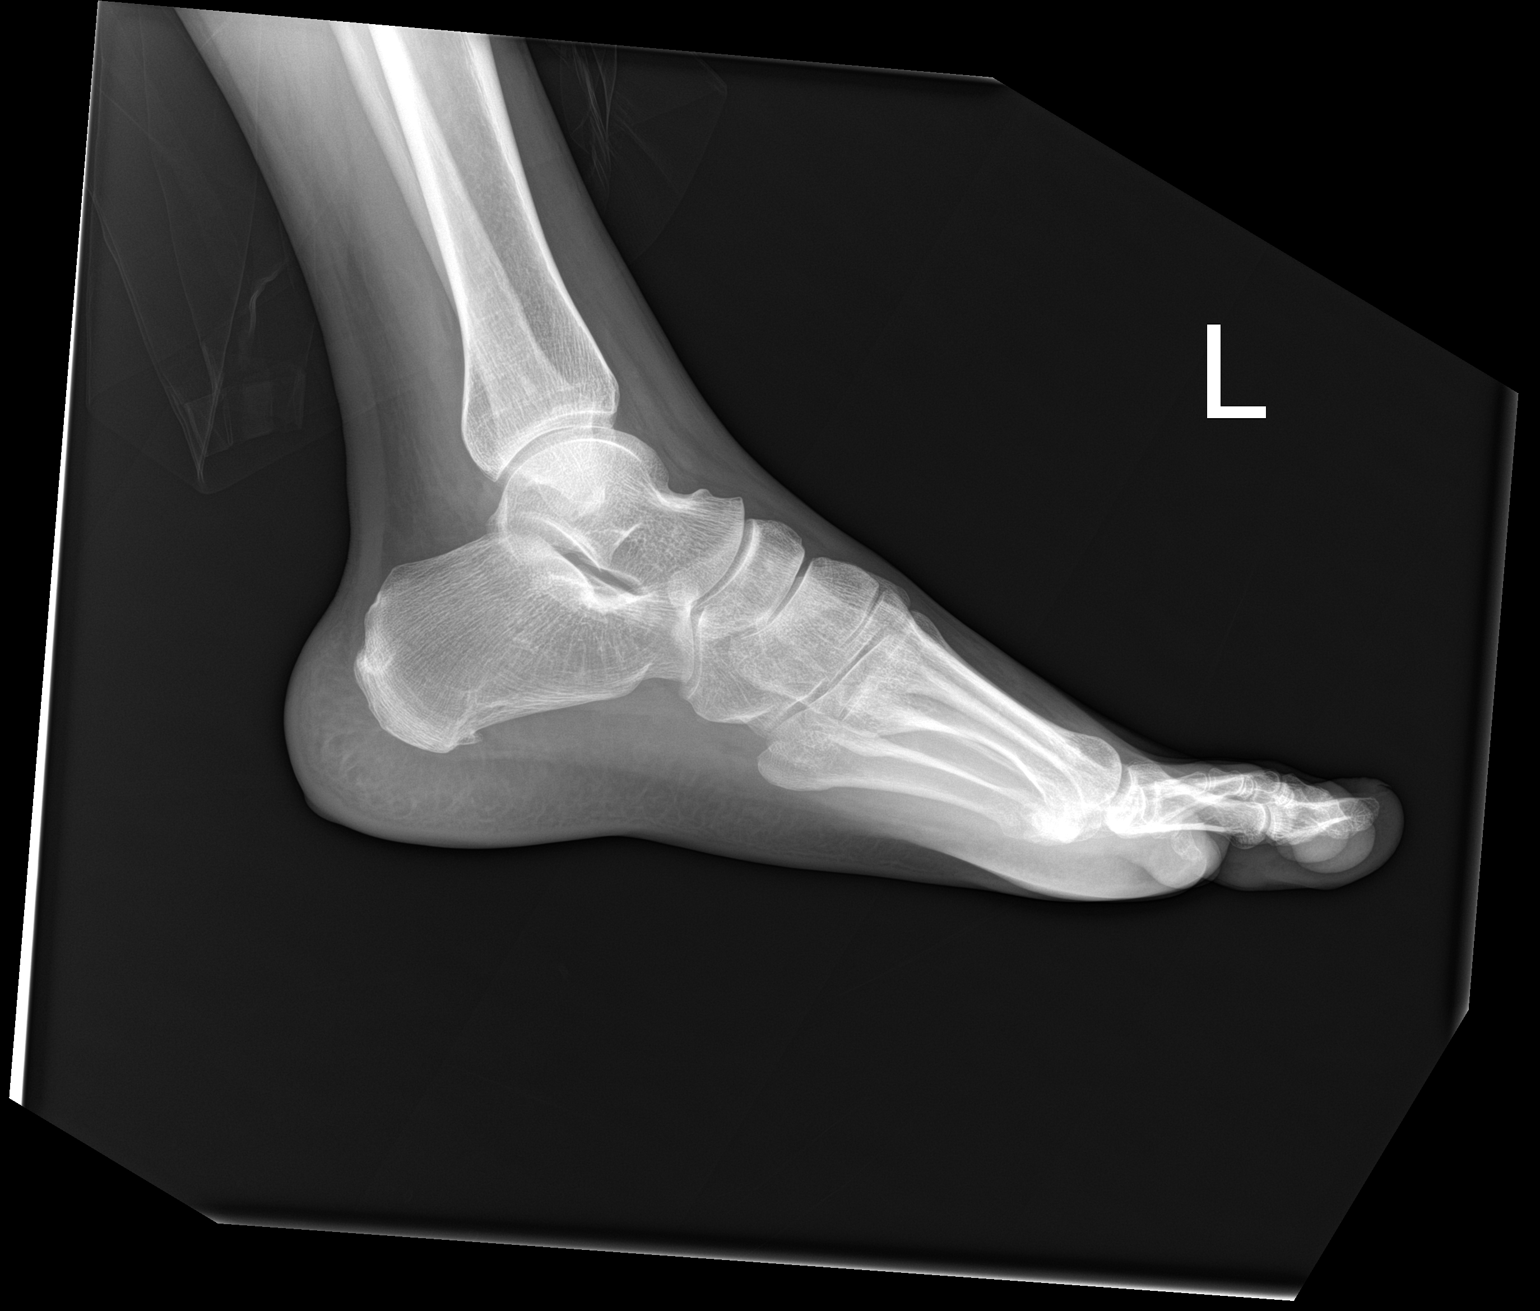

[3 of 3 positions shown; findings below may reference images not displayed]

FINDINGS: Small plantar and Achilles calcaneal spurs. No acute bony findings.
No gas tracking in the soft tissues or foreign body observed.
IMPRESSION: 1. Small plantar and Achilles calcaneal spurs.

## 2021-03-11 IMAGING — CT CT HEAD W/O CM
4 series · 16 of 47 positions shown, 18 images · non-contrast
Comparison: Prior head CT examinations [DATE].

CLINICAL DATA: Head trauma, moderate/severe. Additional history
provided: Seizure

EXAM:
CT HEAD WITHOUT CONTRAST
TECHNIQUE: Contiguous axial images were obtained from the base of the skull
through the vertex without intravenous contrast.

[Series 3: head without · axial · non-contrast · 0.49mm/px · z∈[-103,+27]mm · 7 of 36 slices shown, 9 images]
[im 5/36  brain]
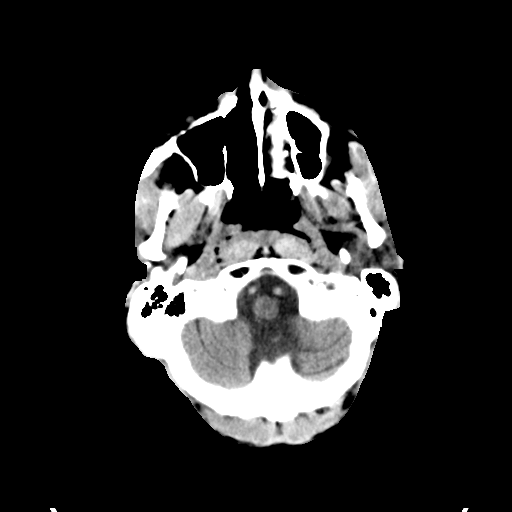
[im 5/36  bone]
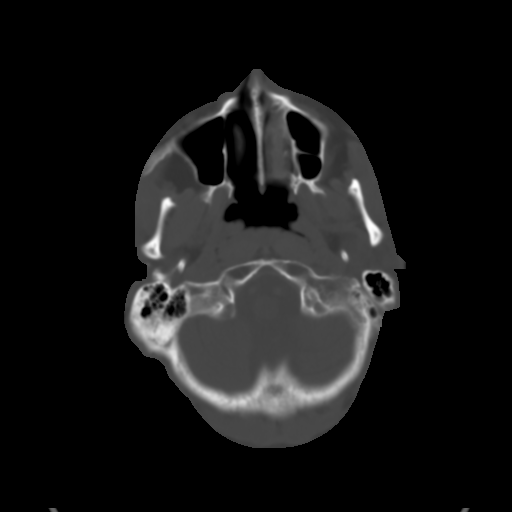
[im 9/36  brain]
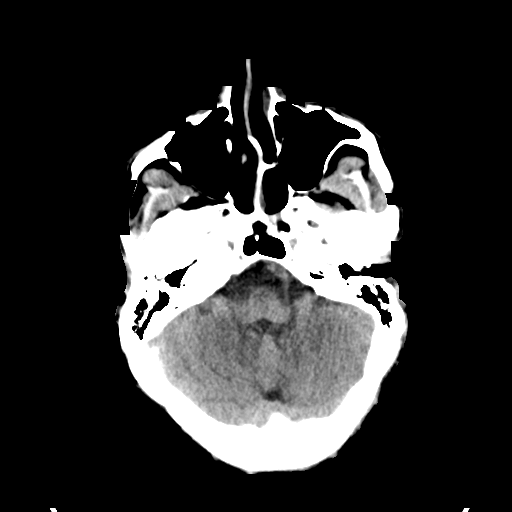
[im 14/36  brain]
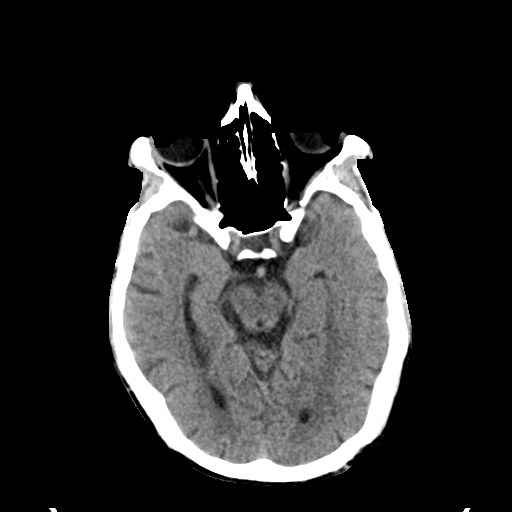
[im 18/36  brain]
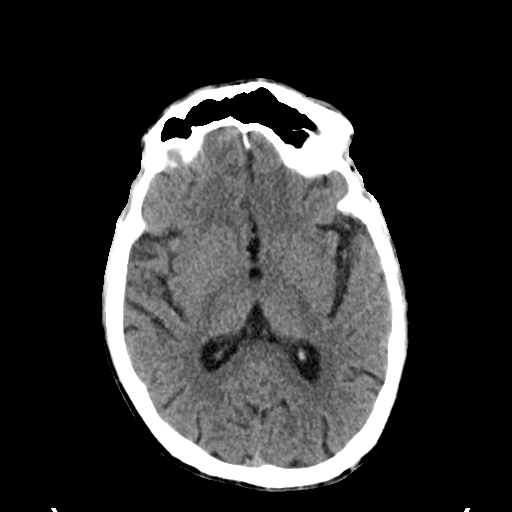
[im 22/36  brain]
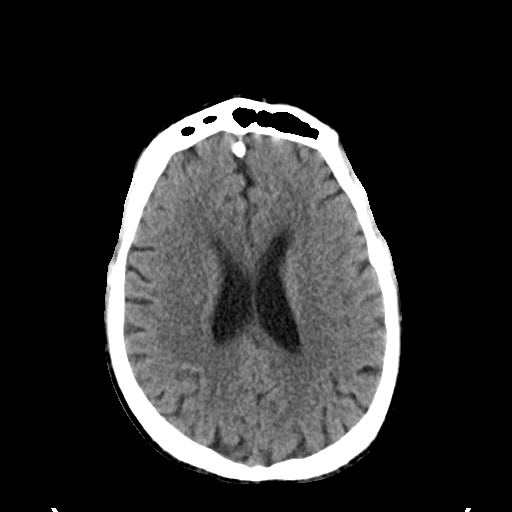
[im 22/36  bone]
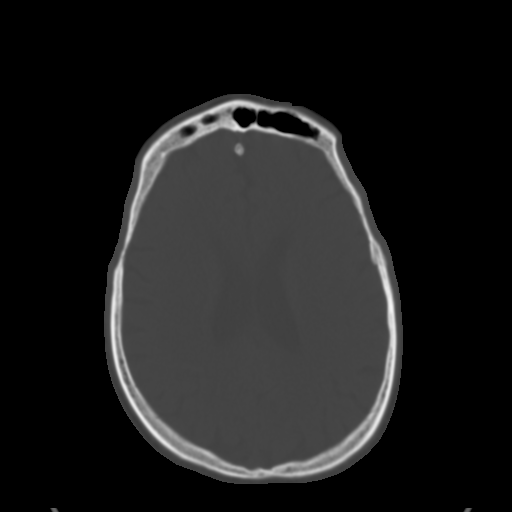
[im 27/36  brain]
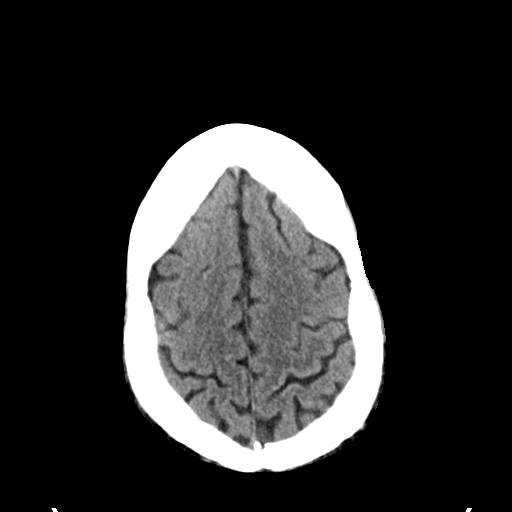
[im 31/36  brain]
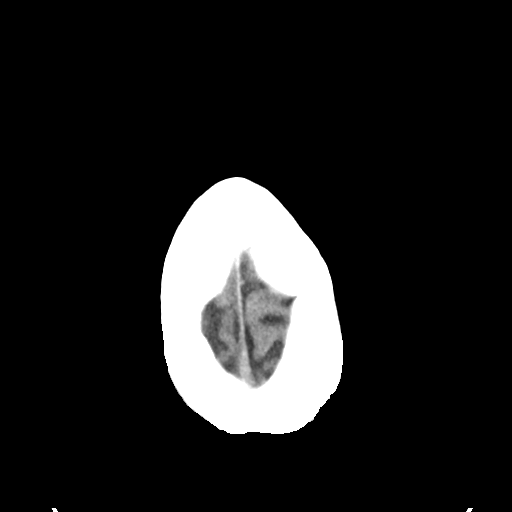

[Series 4: head bone · axial · 0.49mm/px · z∈[-107,-71]mm · 3 of 90 slices shown]
[im 9/90  bone]
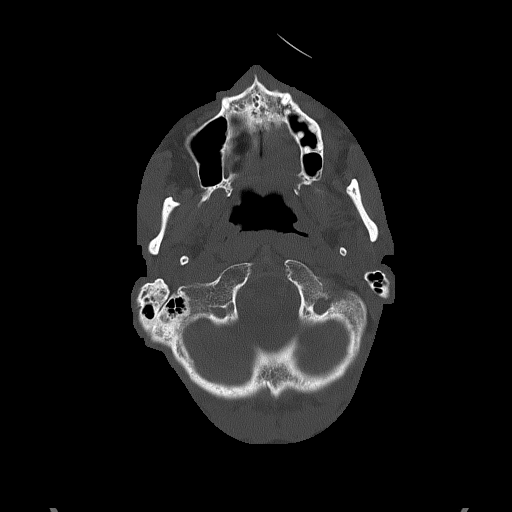
[im 18/90  bone]
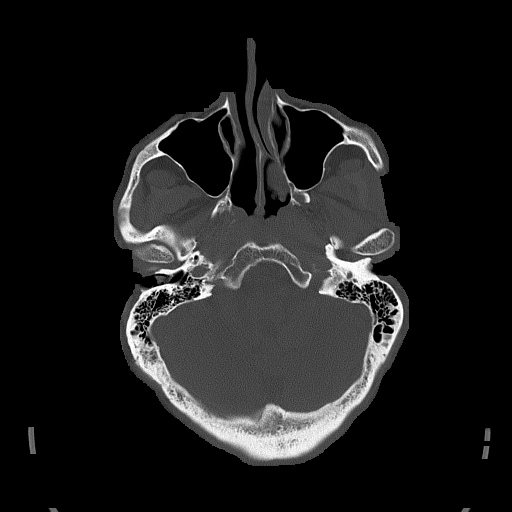
[im 27/90  bone]
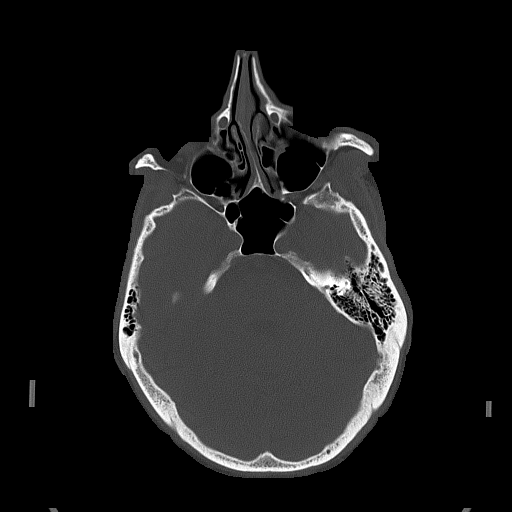

[Series 5: head without cor · coronal · non-contrast · 0.34mm/px · 3 of 71 slices shown]
[im 24/71  brain]
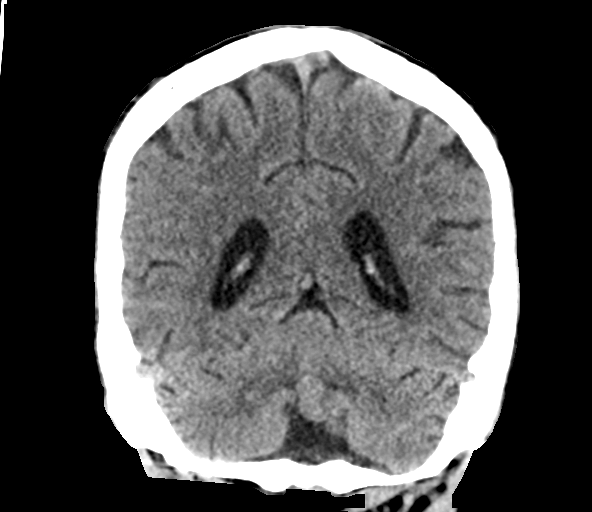
[im 32/71  brain]
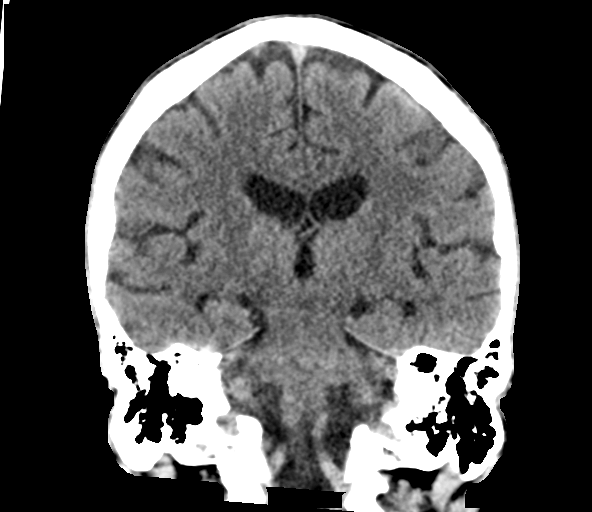
[im 39/71  brain]
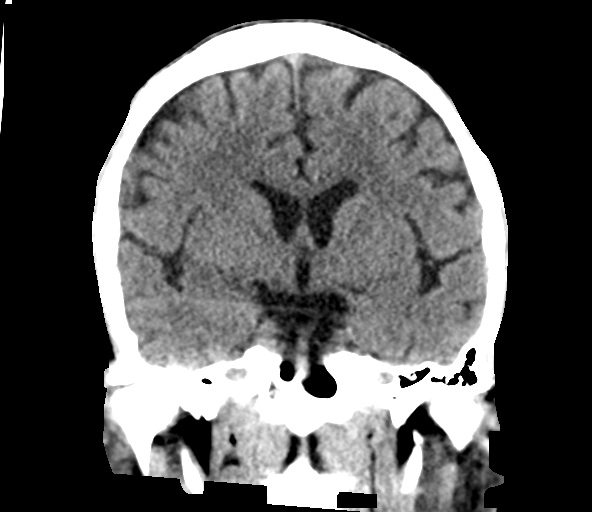

[Series 6: head without sag · sagittal · non-contrast · 0.34mm/px · 3 of 54 slices shown]
[im 18/54  brain]
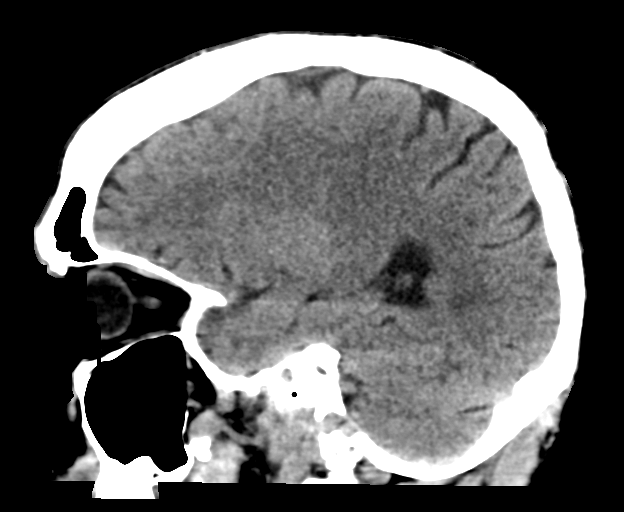
[im 27/54  brain]
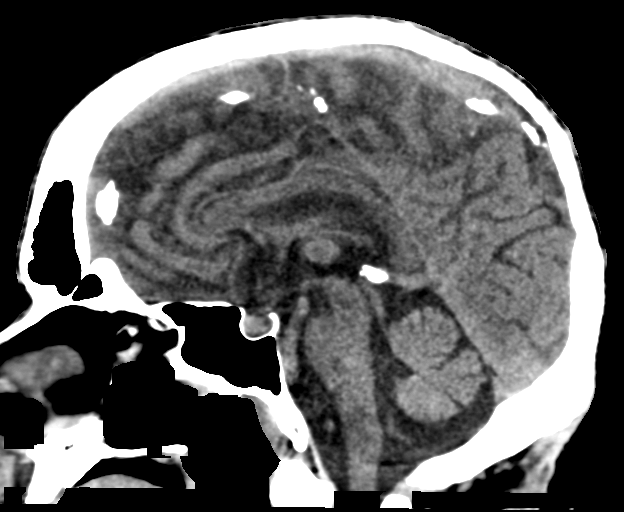
[im 36/54  brain]
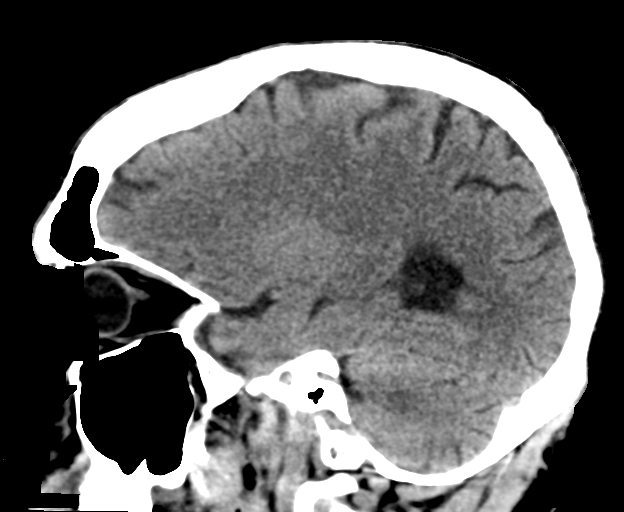

[16 of 47 positions shown; findings below may reference images not displayed]

FINDINGS: Brain:

Cerebral volume is normal.

There is no acute intracranial hemorrhage.

No demarcated cortical infarct.

No extra-axial fluid collection.

No evidence of intracranial mass.

No midline shift.

Vascular: No hyperdense vessel.

Skull: Normal. Negative for fracture or focal lesion.

Sinuses/Orbits: Visualized orbits show no acute finding. Trace
bilateral ethmoid sinus mucosal thickening.
IMPRESSION: No evidence of acute intracranial abnormality.

## 2021-03-11 MED ORDER — ADULT MULTIVITAMIN W/MINERALS CH
1.0000 | ORAL_TABLET | Freq: Every day | ORAL | Status: DC
  Administered 2021-03-11 – 2021-03-19 (×9): 1 via ORAL
  Filled 2021-03-11 (×9): qty 1

## 2021-03-11 MED ORDER — ONDANSETRON HCL 4 MG PO TABS
4.0000 mg | ORAL_TABLET | Freq: Four times a day (QID) | ORAL | Status: DC | PRN
  Administered 2021-03-11: 4 mg via ORAL
  Filled 2021-03-11: qty 1

## 2021-03-11 MED ORDER — LORAZEPAM 1 MG PO TABS
1.0000 mg | ORAL_TABLET | ORAL | Status: AC | PRN
  Administered 2021-03-14 (×2): 1 mg via ORAL
  Administered 2021-03-16: 2 mg via ORAL
  Filled 2021-03-11: qty 2
  Filled 2021-03-11 (×3): qty 1
  Filled 2021-03-11: qty 2

## 2021-03-11 MED ORDER — LORAZEPAM 1 MG PO TABS
0.0000 mg | ORAL_TABLET | Freq: Four times a day (QID) | ORAL | Status: AC
Start: 2021-03-11 — End: 2021-03-13
  Administered 2021-03-11: 1 mg via ORAL
  Filled 2021-03-11: qty 1
  Filled 2021-03-11: qty 4

## 2021-03-11 MED ORDER — HYDROXYZINE HCL 25 MG PO TABS
25.0000 mg | ORAL_TABLET | Freq: Four times a day (QID) | ORAL | Status: AC | PRN
  Administered 2021-03-12 – 2021-03-14 (×4): 25 mg via ORAL
  Filled 2021-03-11 (×4): qty 1

## 2021-03-11 MED ORDER — DEXTROSE 50 % IV SOLN: 25.0000 g | Freq: Once | INTRAVENOUS | Status: DC | PRN

## 2021-03-11 MED ORDER — ENOXAPARIN SODIUM 40 MG/0.4ML IJ SOSY
40.0000 mg | PREFILLED_SYRINGE | INTRAMUSCULAR | Status: DC
  Administered 2021-03-11 – 2021-03-18 (×7): 40 mg via SUBCUTANEOUS
  Filled 2021-03-11 (×7): qty 0.4

## 2021-03-11 MED ORDER — THIAMINE HCL 100 MG/ML IJ SOLN
100.0000 mg | Freq: Every day | INTRAMUSCULAR | Status: DC
  Filled 2021-03-11 (×2): qty 2

## 2021-03-11 MED ORDER — ACETAMINOPHEN 325 MG PO TABS
650.0000 mg | ORAL_TABLET | Freq: Four times a day (QID) | ORAL | Status: DC | PRN
  Administered 2021-03-11 – 2021-03-18 (×2): 650 mg via ORAL
  Filled 2021-03-11 (×2): qty 2

## 2021-03-11 MED ORDER — SODIUM CHLORIDE 0.9 % IV BOLUS
1000.0000 mL | Freq: Once | INTRAVENOUS | Status: AC
  Administered 2021-03-11: 1000 mL via INTRAVENOUS

## 2021-03-11 MED ORDER — THIAMINE HCL 100 MG PO TABS
100.0000 mg | ORAL_TABLET | Freq: Every day | ORAL | Status: DC
  Administered 2021-03-12 – 2021-03-19 (×8): 100 mg via ORAL
  Filled 2021-03-11 (×8): qty 1

## 2021-03-11 MED ORDER — LORAZEPAM 2 MG/ML IJ SOLN
0.0000 mg | Freq: Four times a day (QID) | INTRAMUSCULAR | Status: AC
Start: 2021-03-11 — End: 2021-03-13
  Administered 2021-03-11 – 2021-03-12 (×5): 2 mg via INTRAVENOUS
  Administered 2021-03-12: 4 mg via INTRAVENOUS
  Administered 2021-03-13: 2 mg via INTRAVENOUS
  Filled 2021-03-11 (×2): qty 1
  Filled 2021-03-11 (×2): qty 2
  Filled 2021-03-11 (×3): qty 1

## 2021-03-11 MED ORDER — NICOTINE 14 MG/24HR TD PT24
14.0000 mg | MEDICATED_PATCH | Freq: Every day | TRANSDERMAL | Status: DC
  Administered 2021-03-11: 14 mg via TRANSDERMAL

## 2021-03-11 MED ORDER — SODIUM CHLORIDE 0.9% FLUSH
3.0000 mL | Freq: Two times a day (BID) | INTRAVENOUS | Status: DC
  Administered 2021-03-11 – 2021-03-19 (×17): 3 mL via INTRAVENOUS

## 2021-03-11 MED ORDER — THIAMINE HCL 100 MG/ML IJ SOLN
100.0000 mg | Freq: Once | INTRAMUSCULAR | Status: AC
  Administered 2021-03-11: 100 mg via INTRAVENOUS
  Filled 2021-03-11: qty 2

## 2021-03-11 MED ORDER — NICOTINE 21 MG/24HR TD PT24
21.0000 mg | MEDICATED_PATCH | Freq: Every day | TRANSDERMAL | Status: DC
  Administered 2021-03-12: 21 mg via TRANSDERMAL
  Filled 2021-03-11 (×8): qty 1

## 2021-03-11 MED ORDER — CHLORDIAZEPOXIDE HCL 25 MG PO CAPS
25.0000 mg | ORAL_CAPSULE | Freq: Four times a day (QID) | ORAL | Status: DC
  Administered 2021-03-11 – 2021-03-13 (×7): 25 mg via ORAL
  Filled 2021-03-11 (×7): qty 1

## 2021-03-11 MED ORDER — LORAZEPAM 2 MG/ML IJ SOLN: 0.0000 mg | Freq: Two times a day (BID) | INTRAMUSCULAR | Status: DC

## 2021-03-11 MED ORDER — LORAZEPAM 2 MG/ML IJ SOLN
1.0000 mg | INTRAMUSCULAR | Status: AC | PRN
  Administered 2021-03-11 (×2): 2 mg via INTRAVENOUS
  Administered 2021-03-12: 1 mg via INTRAVENOUS
  Administered 2021-03-12 (×2): 2 mg via INTRAVENOUS
  Administered 2021-03-13 (×2): 1 mg via INTRAVENOUS
  Administered 2021-03-13: 2 mg via INTRAVENOUS
  Administered 2021-03-13: 1 mg via INTRAVENOUS
  Administered 2021-03-13: 2 mg via INTRAVENOUS
  Administered 2021-03-13: 1 mg via INTRAVENOUS
  Administered 2021-03-14: 2 mg via INTRAVENOUS
  Administered 2021-03-15 (×2): 1 mg via INTRAVENOUS
  Filled 2021-03-11 (×14): qty 1

## 2021-03-11 MED ORDER — ONDANSETRON HCL 4 MG/2ML IJ SOLN
4.0000 mg | Freq: Four times a day (QID) | INTRAMUSCULAR | Status: DC | PRN
  Administered 2021-03-12: 4 mg via INTRAVENOUS
  Filled 2021-03-11: qty 2

## 2021-03-11 MED ORDER — FOLIC ACID 1 MG PO TABS
1.0000 mg | ORAL_TABLET | Freq: Every day | ORAL | Status: DC
  Administered 2021-03-11 – 2021-03-19 (×9): 1 mg via ORAL
  Filled 2021-03-11 (×9): qty 1

## 2021-03-11 MED ORDER — ACETAMINOPHEN 650 MG RE SUPP: 650.0000 mg | Freq: Four times a day (QID) | RECTAL | Status: DC | PRN

## 2021-03-11 MED ORDER — SODIUM CHLORIDE 0.9 % IV SOLN: INTRAVENOUS | Status: DC

## 2021-03-11 MED ORDER — POTASSIUM CHLORIDE 10 MEQ/100ML IV SOLN
10.0000 meq | INTRAVENOUS | Status: AC
  Administered 2021-03-11: 10 meq via INTRAVENOUS
  Filled 2021-03-11 (×2): qty 100

## 2021-03-11 MED ORDER — CHLORDIAZEPOXIDE HCL 25 MG PO CAPS: 25.0000 mg | ORAL_CAPSULE | Freq: Four times a day (QID) | ORAL | Status: DC | PRN

## 2021-03-11 MED ORDER — LOPERAMIDE HCL 2 MG PO CAPS: 2.0000 mg | ORAL_CAPSULE | ORAL | Status: AC | PRN

## 2021-03-11 MED ORDER — POTASSIUM CHLORIDE 10 MEQ/100ML IV SOLN
10.0000 meq | INTRAVENOUS | Status: AC
  Administered 2021-03-11 (×3): 10 meq via INTRAVENOUS
  Filled 2021-03-11 (×3): qty 100

## 2021-03-11 MED ORDER — LORAZEPAM 1 MG PO TABS: 0.0000 mg | ORAL_TABLET | Freq: Two times a day (BID) | ORAL | Status: DC

## 2021-03-11 MED ORDER — LORAZEPAM 2 MG/ML IJ SOLN: 1.0000 mg | Freq: Once | INTRAMUSCULAR | Status: DC | PRN

## 2021-03-11 NOTE — ED Notes (Signed)
Spoke with Farrel Gordon PA. Asked her if pt can eat. She ok'd the request

## 2021-03-11 NOTE — Progress Notes (Addendum)
Pt arrived on 3w at 2051. Pt had not been started on 1500 potassium x3 while in ED. Dr. Aviva Kluver informed. ED nurse caring for pt asked to start dose as scheduled by MD before bringing pt to 3w.

## 2021-03-11 NOTE — H&P (Addendum)
Date: 03/11/2021               Patient Name:  Gary [name] MRN: [id number]  DOB: Aug 24, 1986 Age / Sex: 35 y.o., male   PCP: Pcp, No         Medical Service: Internal Medicine Teaching Service         Attending Physician: Dr. Debe Coder    First Contact: Dr. Laddie Aquas Pager: [phone]  Second Contact: Dr. Mcarthur Rossetti Pager: [phone]       After Hours (After 5p/  First Contact Pager: [phone]  weekends / holidays): Second Contact Pager: [phone]   Chief Complaint: seizure-like activity  History of Present Illness:   Gary [name] is a 35 y.o. gentleman w/ PMHx uncontrolled HIV, polysubstance use including AUD with hx of withdrawal seizures, crack cocaine and methamphetamine use, severe recurrent MDD without psychosis, history of SI and HI, subdural hematoma, GI hemorrhage, and pancreatitis, who was brought to the ED by EMS due to concern for seizure activity. Patient is homeless and says he previously drank about 7 bottles of wine per day, but has more recently had difficulties obtaining wine and has drank less than half his typical alcohol quantity. The last thing he remembers was walking from the store with his wine today. He currently endorses symptoms of withdrawal including severe dizziness, light-headedness, diffuse headache, nausea, diffuse leg aching, shakiness, generalized weakness, watery bowel movements recently, and "crushing" left-sided CP that has been present for several hours that he states typically occurs when he stops drinking and resolve with alcohol. He says he has been admitted to the hospital for alcohol withdrawal and says he has had seizures only upon cessation of alcohol use and admits to drinking heavily to avoid recurrent seizures. He doesn't believe he's ever been admitted to the ICU or been intubated in the past for seizures. Denies childhood history of seizures. He notes right big toe and pinky toe pain, saying his boots got wet several days ago. Denies fevers,  chills, vomiting, abdominal pain, SOB, dark or bloody stools, or any other symptoms. He says his current mood is "remorseful". He had recently been admitted for SI and HI. He says he has had active SI in the past, wanting to drink to the point he wouldn't wake up, although denies this desire currently and states he now wishes to go through rehabilitation for alcohol cessation. He says he has thoughts of harming others, "only when they irritate or disrespect" him. He does admit to crack cocaine use 2 days ago. Notes he previously took [place] although has been out of his HIV medications for several weeks and has frequent unprotected intercourse.  Home Medications: Patient ran out of [place] and is not on other medications currently.   Allergies: Allergies as of 03/11/2021 - Review Complete 03/11/2021  Allergen Reaction Noted  . Tegretol [carbamazepine] Other (See Comments) 06/07/2020  . Caffeine Palpitations 06/15/2018   Past Medical History:  Diagnosis Date  . Alcohol abuse   . HIV (human immunodeficiency virus infection) (HCC)   . Subdural hematoma (HCC)    Family History:  Family History  Problem Relation Age of Onset  . Alcohol abuse Mother   . Alcohol abuse Father     Social History:  Patient drinks up to 7 bottles of wine daily.  Currently smokes 1 PPD. He says he has been smoking since age 29.  He uses crack cocaine and methamphetamines. He has frequent unprotected sex with men.  He currently is  homeless and has been incarcerated in the recent past.  Review of Systems: A complete ROS was negative except as per HPI.   Physical Exam: Blood pressure 113/76, pulse 76, temperature 98.4 F (36.9 C), temperature source Oral, resp. rate 13, height 6\' 1"  (1.854 m), weight 70.3 kg, SpO2 95 %.   General: Patient appears thin, intoxicated, and chronically ill, in mild acute distress. Eyes: Sclera non-icteric. Bilateral eyes are erythematous.  HENT: MMM. No nasal  discharge. Respiratory: Lungs are CTA, bilaterally. No wheezes, rales, or rhonchi. No increased work of breathing on room air. Cardiovascular: Regular rate and rhythm. No murmurs, rubs, or gallops. No lower extremity edema. Distal pulses are 3+ in bilateral lower extremities.  Neurological: Patient is alert and oriented. CN II-XII are intact. Strength is 4+/5 in all four extremities.  MSK: Patient has diffuse cachexia. There is significant TTP over bilateral heels, the ball of the right foot, and right 5th toe. Abdominal: Soft, non-tender, not distended. Bowel sounds intact. No rebound or guarding. Skin: Right 5th toe has purple discoloration throughout the plantar surface with superficial ulceration without active drainage. There is erythema over the ball of the right great toe. No lesions noted on the left foot. Patient actively itching face and face appears erythematous, without rashes or other lesions. There is purpura over the left upper arm. Psych: Affect is flat and tense. Normal tone of voice. Insight is good. Denies SI or current HI.  EKG: personally reviewed my interpretation is NSR at 89bpm with Q waves in anterior leads, possible U waves, borderline prolonged QTc interval, and high voltage consistent with LVH. No acute T wave or ST segment changes to suggest acute ischemia.  CT Head Without Contrast: personally reviewed by me. No evidence of acute intracranial abnormality.   Assessment & Plan by Problem: Active Problems:   * No active hospital problems. *  # Possible Seizure in the setting of Alcohol Withdrawal # Hx of AUD Patient's story does sound convincing of seizure activity. It does seem alcohol withdrawal is the most likely precipitant as he endorses similar troubles in the past. Will also rule out active infection due to concerning foot ulcer in damp boots. Patient does not take anti-epileptics. Denies childhood hx of seizures or hx of seizures in settings other than alcohol  withdrawal.  - Seizure Precautions  - CIWA with Ativan  - Zofran 4mg  q6 hours PRN  - Daily thiamine 100mg , folic acid 1mg  and MTV - Check TSH, vitamin B12, folate - Continue to monitor CMP and CBC  # HIV Patient previously had been taking Genvoya although says he has not taken this over the last several weeks. He endorses frequent unprotected sexual intercourse with men. He previously followed with Dr. in 2020 and Dr. 02/2020. He previously had access to his medications although he was incarcerated during this time. It is likely he has not taken Genvoya for quite some time. - Check CD4 count and viral load  - Check for HCV/HBV and syphilis  # Pancytopenia  # Macrocytic Anemia WBC 2.9, Hgb 11.8, MCV 101.1, platelets 101k. He has a similar history of thrombocytopenia in the past, likely due to cirrhosis in the setting of AUD and possibly in setting of uncontrolled HIV and fluid repletion; however, he did have normal counts 03/07/21 and did not appear dry during initial exam.  # Foot Ulcer Patient has purplish discoloration over his right 1st and 5th digits on the plantar surface. There is ulceration over the 5th  digit. Patient has been wearing damp boots for quite some time with significant odor, concerning for possible non-purulent infection. However, WBC is low and he is afebrile. - Check bilateral foot x-rays  - Consult WOC - Tylenol 650mg  q6 hours PRN for pain   # Hypokalemia # Hypocalcemia  Potassium is 3.3, calcium is 8.8. Likely in the setting of nutritional deficiency. EKG does show mild signs of hypokalemia and he has muscle cramping, although likely in setting of recent seizure.  - Continue to monitor electrolytes - Monitor for refeeding syndrome  - Encourage PO intake as tolerated  - Will replace as needed  # TUD  Patient smokes ~1PPD regularly if not more.  - Nicotine patch 21mg  daily   # Polysubstance Use Disorder  Patient additionally uses crack cocaine and  methamphetamines. He says he last used these about 2 days ago and is interested in cutting back / rehabilitation. He had  - Consult TOC/SW/DM  # Severe, Recurrent MDD w/o Psychosis w/ Concern for HI Patient was evaluated by psychiatry at [facility] long ED 03/08/21. Per chart review, he had been trying to "drink himself to death" and was "ready to give up" at that time with severe depression and anxiety. He had been through a 28 day program through [facility] 2 years ago. He has limited community support and is not currently on any medications for symptom management. He denies any active SI currently and states he does not intend to harm any particular individual. - Continue to monitor  - Consider psychiatry referral if symptoms worsen - Will require outpatient follow up  Diet: Regular  DVT PPx: Lovenox Injections Code Status: Full Code  IVF: None  Dispo: Admit patient to Inpatient with expected length of stay greater than 2 midnights.  Signed: [facility], MD 03/11/2021, 3:08 PM  Pager: [phone] After 5pm on weekdays and 1pm on weekends: On Call pager: [phone]

## 2021-03-11 NOTE — ED Notes (Signed)
Informed by CT staff that pt was uncooperative/aggressive during scan. This RN informed EDP.

## 2021-03-11 NOTE — Plan of Care (Signed)
  Problem: Education: Goal: Expressions of having a comfortable level of knowledge regarding the disease process will increase Outcome: Progressing   Problem: Coping: Goal: Ability to adjust to condition or change in health will improve Outcome: Progressing Goal: Ability to identify appropriate support needs will improve Outcome: Progressing   Problem: Health Behavior/Discharge Planning: Goal: Compliance with prescribed medication regimen will improve Outcome: Progressing   Problem: Medication: Goal: Risk for medication side effects will decrease Outcome: Progressing   Problem: Clinical Measurements: Goal: Complications related to the disease process, condition or treatment will be avoided or minimized Outcome: Progressing Goal: Diagnostic test results will improve Outcome: Progressing   Problem: Safety: Goal: Verbalization of understanding the information provided will improve Outcome: Progressing   Problem: Self-Concept: Goal: Level of anxiety will decrease Outcome: Progressing Goal: Ability to verbalize feelings about condition will improve Outcome: Progressing   Problem: Education: Goal: Knowledge of disease or condition will improve Outcome: Progressing Goal: Understanding of discharge needs will improve Outcome: Progressing   Problem: Health Behavior/Discharge Planning: Goal: Ability to identify changes in lifestyle to reduce recurrence of condition will improve Outcome: Progressing Goal: Identification of resources available to assist in meeting health care needs will improve Outcome: Progressing   Problem: Physical Regulation: Goal: Complications related to the disease process, condition or treatment will be avoided or minimized Outcome: Progressing   Problem: Safety: Goal: Ability to remain free from injury will improve Outcome: Progressing   

## 2021-03-11 NOTE — ED Provider Notes (Signed)
[facility] EM[facility]GENCY [facility] Provider Note   CSN: [id number] Arrival date & time: 03/11/21  1006     History Chief Complaint  Patient presents with  . Seizures    Gary [name] is a 35 y.o. male with past medical history of alcohol abuse, HIV, subdural hematoma, suicidal and homicidal ideation with substance-induced mood disorder, amphetamine abuse that presents to the emergency department today for witnessed seizure.  Patient is currently homeless, was discharged from the ED 2 days ago for alcohol induced mood disorder initially presenting with suicidal ideation and wanting detox from alcohol.  Patient was psych cleared on May 1 and was discharged with resources.  Patient states that after he was discharged he drinks 7 bottles of wine, then drank 2 more this morning.  Fire found him on the sidewalk, having seizure-like activity.  On EMS arrival he was postictal, aggressive and not cooperative.  He did not get any medications by EMS.  EMS did not mention what type of seizure-like activity was witnessed.  Unsure if he hit his head.  Patient is not cooperative on exam, alert and oriented x2.  Level 5 caveat.  HPI     Past Medical History:  Diagnosis Date  . Alcohol abuse   . HIV (human immunodeficiency virus infection) (HCC)   . Subdural hematoma [facility](HCC)     Patient Active Problem List   Diagnosis Date Noted  . 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 Elevated troponin 10/19/2020  . Alcohol withdrawal (HCC) 10/19/2020  . Alcohol abuse   . Suicidal ideation 06/14/2020  . Pancreatitis 06/13/2020  . 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  . Malnutrition of moderate degree 05/07/2020  . Gastrointestinal hemorrhage   . Alcoholic hepatitis without ascites   . Melena 05/05/2020  . Alcoholic ketoacidosis 05/05/2020  . Thrombocytopenia (HCC) 07/25/2019  . Transaminitis 07/25/2019  . Traumatic subdural hematoma (HCC) 07/02/2019  . Alcohol abuse with  intoxication (HCC) 07/02/2019  . 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 Alcohol use disorder, severe, dependence (HCC) 04/16/2019  . 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 HIV disease (HCC) 06/16/2018  . Polysubstance abuse (HCC) 06/16/2018  . Cigarette smoker 06/16/2018    Past Surgical History:  Procedure Laterality Date  . INCISION AND DRAINAGE PERIRECTAL ABSCESS N/A 09/24/2016   Procedure: IRRIGATION AND DEBRIDEMENT PERIRECTAL ABSCESS;  Surgeon: Ricarda Frameharles Woodham, MD;  Location: [facility] ORS;  Service: General;  Laterality: N/A;  . none         Family History  Problem Relation Age of Onset  . Alcohol abuse Mother   . Alcohol abuse Father     Social History   Tobacco Use  . Smoking status: Former Smoker    Packs/day: 0.50    Types: Cigarettes  . Smokeless tobacco: Never Used  . Tobacco comment: unable to smoke while incarcerated 6+ months 02/13/20  Vaping Use  . Vaping Use: Never used  Substance Use Topics  . Alcohol use: Yes    Comment: "I drink all I can"  . Drug use: Not Currently    Types: Marijuana, Methamphetamines    Comment: last used 04/10/2019    Home Medications Prior to Admission medications   Medication Sig Start Date End Date Taking? Authorizing Provider  chlordiazePOXIDE (LIBRIUM) 25 MG capsule 50mg  PO TID x 1D, then 25-50mg  PO BID X 1D, then 25-50mg  PO QD X 1D 02/26/21   Cheryll CockayneHong, Joshua S, MD  elvitegravir-cobicistat-emtricitabine-tenofovir (GENVOYA) 150-150-200-10 MG TABS tablet Take  1 tablet by mouth daily with breakfast. 09/28/20   Clapacs, Jackquline Denmark, MD  famotidine (PEPCID) 20 MG tablet Take 1 tablet (20 mg total) by mouth 2 (two) times daily. Patient not taking: Reported on 06/01/2019 05/14/19 06/02/19  Benjiman Core, MD    Allergies    Tegretol [carbamazepine] and Caffeine  Review of Systems   Review of Systems  Unable to perform ROS: Acuity of condition    Physical Exam Updated Vital  Signs BP 100/67   Pulse 70   Temp 98.4 F (36.9 C) (Oral)   Resp 17   Ht 6\' 1"  (1.854 m)   Wt 70.3 kg   SpO2 96%   BMI 20.45 kg/m   Physical Exam Constitutional:      General: He is not in acute distress.    Appearance: Normal appearance. He is ill-appearing. He is not toxic-appearing or diaphoretic.     Comments: Patient appears chronically ill, way older appearance than stated age.  Slumped over, becomes verbally aggressive when I ask him to do physical exam, therefore limited exam at this time.  Alert to self and knows he is in the hospital, unsure which 1.  Unsure of date or situation.  Appears intoxicated.  HENT:     Mouth/Throat:     Mouth: Mucous membranes are moist.     Pharynx: Oropharynx is clear.  Eyes:     General: No scleral icterus.    Extraocular Movements: Extraocular movements intact.     Pupils: Pupils are equal, round, and reactive to light.  Cardiovascular:     Rate and Rhythm: Normal rate and regular rhythm.     Pulses: Normal pulses.     Heart sounds: Normal heart sounds.  Pulmonary:     Effort: Pulmonary effort is normal. No respiratory distress.     Breath sounds: Normal breath sounds. No stridor. No wheezing, rhonchi or rales.  Chest:     Chest wall: No tenderness.  Abdominal:     General: Abdomen is flat. There is no distension.     Palpations: Abdomen is soft.     Tenderness: There is no abdominal tenderness. There is no guarding or rebound.  Musculoskeletal:        General: No swelling or tenderness. Normal range of motion.     Cervical back: Normal range of motion and neck supple. No rigidity.     Right lower leg: No edema.     Left lower leg: No edema.  Skin:    General: Skin is warm and dry.     Capillary Refill: Capillary refill takes less than 2 seconds.     Coloration: Skin is not pale.  Neurological:     General: No focal deficit present.     Mental Status: He is alert.     Coordination: Coordination abnormal.     Comments: Alert  to self, unsure where he is, states that he is in the hospital.  Unsure of date or year.  Unsure of situation.  Patient is verbally aggressive and will not cooperate with any commands, however will move bilateral upper and lower extremity spontaneously.  Patient is extremely tremulous bilaterally, abnormal coordination bilaterally.     ED Results / Procedures / Treatments   Labs (all labs ordered are listed, but only abnormal results are displayed) Labs Reviewed  CBC WITH DIFFERENTIAL/PLATELET - Abnormal; Notable for the following components:      Result Value   RBC 3.87 (*)    HCT 38.8 (*)  MCV 100.3 (*)    MCH 34.1 (*)    Platelets 128 (*)    All other components within normal limits  COMPREHENSIVE METABOLIC PANEL - Abnormal; Notable for the following components:   Potassium 3.3 (*)    Calcium 8.8 (*)    AST 67 (*)    ALT 46 (*)    All other components within normal limits  ETHANOL - Abnormal; Notable for the following components:   Alcohol, Ethyl (B) 396 (*)    All other components within normal limits  RESP PANEL BY RT-PCR (FLU A&B, COVID) ARPGX2  MAGNESIUM  PHOSPHORUS  RAPID URINE DRUG SCREEN, HOSP PERFORMED  COMPREHENSIVE METABOLIC PANEL  MAGNESIUM  PHOSPHORUS  CBC  CBG MONITORING, ED  CBG MONITORING, ED    EKG EKG Interpretation  Date/Time:  Tuesday Mar 11 2021 10:18:09 EDT Ventricular Rate:  89 PR Interval:  148 QRS Duration: 107 QT Interval:  364 QTC Calculation: 443 R Axis:   66 Text Interpretation: Sinus rhythm Probable anteroseptal infarct, old similar to previous Confirmed by Frederick Peers [id number]) on 03/11/2021 11:55:48 AM   Radiology CT HEAD WO CONTRAST  Result Date: 03/11/2021 CLINICAL DATA:  Head trauma, moderate/severe. Additional history provided: Seizure EXAM: CT HEAD WITHOUT CONTRAST TECHNIQUE: Contiguous axial images were obtained from the base of the skull through the vertex without intravenous contrast. COMPARISON:  Prior head CT  examinations 03/07/2021. FINDINGS: Brain: Cerebral volume is normal. There is no acute intracranial hemorrhage. No demarcated cortical infarct. No extra-axial fluid collection. No evidence of intracranial mass. No midline shift. Vascular: No hyperdense vessel. Skull: Normal. Negative for fracture or focal lesion. Sinuses/Orbits: Visualized orbits show no acute finding. Trace bilateral ethmoid sinus mucosal thickening. IMPRESSION: No evidence of acute intracranial abnormality. Electronically Signed   By: Jackey Loge DO   On: 03/11/2021 11:25    Procedures Procedures   Medications Ordered in ED Medications  dextrose 50 % solution 25 g (has no administration in time range)  sodium chloride 0.9 % bolus 1,000 mL (0 mLs Intravenous Stopped 03/11/21 1150)    And  0.9 %  sodium chloride infusion ( Intravenous New Bag/Given 03/11/21 1158)  LORazepam (ATIVAN) injection 0-4 mg (2 mg Intravenous Given 03/11/21 1042)    Or  LORazepam (ATIVAN) tablet 0-4 mg ( Oral See Alternative 03/11/21 1042)  LORazepam (ATIVAN) injection 0-4 mg (has no administration in time range)    Or  LORazepam (ATIVAN) tablet 0-4 mg (has no administration in time range)  LORazepam (ATIVAN) tablet 1-4 mg (has no administration in time range)    Or  LORazepam (ATIVAN) injection 1-4 mg (has no administration in time range)  thiamine tablet 100 mg (has no administration in time range)    Or  thiamine (B-1) injection 100 mg (has no administration in time range)  folic acid (FOLVITE) tablet 1 mg (has no administration in time range)  multivitamin with minerals tablet 1 tablet (has no administration in time range)  thiamine (B-1) injection 100 mg (100 mg Intravenous Given 03/11/21 1041)    ED Course  I have reviewed the triage vital signs and the nursing notes.  Pertinent labs & imaging results that were available during my care of the patient were reviewed by me and considered in my medical decision making (see chart for details).     MDM Rules/Calculators/A&P                         VESTAL MARKIN is  a 35 y.o. male with past medical history of alcohol abuse, HIV, subdural hematoma, suicidal and homicidal ideation with substance-induced mood disorder, amphetamine abuse that presents to the emergency department today for witnessed seizure.  Patient appears intoxicated and slightly postictal.  No focal neurodeficits present, however patient is extremely hard to cooperate due to aggressive behavior.  Patient states that he normally drinks 7 bottles of wine, that 2 days ago, today only drink 2 bottles of wine.  No history of seizures.  Will initiate CIWA protocol, give Ativan at this time.  Also obtain basic labs, EKG and CT head.  Patient most likely been admitted for alcoholic seizures.  Work-up today shows alcohol of 396, otherwise work-up unremarkable. LFTS are chronic.  Upon repeat evaluation, patient is sleepy but arousable.  No concerns for DTs at this time, patient without any diaphoresis, tachycardia, tachypnea, hypertension or hyperthermia.  CT head without any acute abnormality.  Patient will need to be admitted for alcohol withdrawal seizures.  Patient admitted to Dr. Mcarthur Rossetti, internal medicine resident.  The patient appears reasonably stabilized for admission considering the current resources, flow, and capabilities available in the ED at this time, and I doubt any other [facility] requiring further screening and/or treatment in the ED prior to admission.  I discussed this case with my attending physician who cosigned this note including patient's presenting symptoms, physical exam, and planned diagnostics and interventions. Attending physician stated agreement with plan or made changes to plan which were implemented.   Final Clinical Impression(s) / ED Diagnoses Final diagnoses:  Alcohol withdrawal seizure without complication [facility])    Rx / DC Orders ED Discharge Orders    None       Farrel Gordon, PA-C 03/11/21  1436    Little, Ambrose Finland, MD 03/14/21 1815

## 2021-03-11 NOTE — ED Triage Notes (Addendum)
Pt to ED via EMS c/o seizure, pt was noted on the sidewalk by fire driving by having seizure like activity. On EMS arrival postictal, aggressive, not cooperative with treatment. HX ETOH Abuse, substance abuse, anxiety , depression. Reports last drink was yesterday evening.  Last VS: CBG 94, 116/86, HR95. No medications given by EMS

## 2021-03-11 NOTE — ED Notes (Signed)
Tried to call report. Nurse can't take report at this moment. Will call back in 15 minutes

## 2021-03-11 NOTE — ED Notes (Signed)
Pt transported to CT ?

## 2021-03-11 NOTE — ED Notes (Signed)
Tried to call report again. Said the nurse was not available again

## 2021-03-11 NOTE — ED Notes (Signed)
hospitalist at bedside

## 2021-03-12 DIAGNOSIS — F10230 Alcohol dependence with withdrawal, uncomplicated: Secondary | ICD-10-CM

## 2021-03-12 DIAGNOSIS — R569 Unspecified convulsions: Secondary | ICD-10-CM

## 2021-03-12 DIAGNOSIS — F10231 Alcohol dependence with withdrawal delirium: Secondary | ICD-10-CM

## 2021-03-12 LAB — CBC WITH DIFFERENTIAL/PLATELET
Abs Immature Granulocytes: 0 10*3/uL (ref 0.00–0.07)
Basophils Absolute: 0 10*3/uL (ref 0.0–0.1)
Basophils Relative: 1 %
Eosinophils Absolute: 0.1 10*3/uL (ref 0.0–0.5)
Eosinophils Relative: 6 %
HCT: 34.4 % — ABNORMAL LOW (ref 39.0–52.0)
Hemoglobin: 11.9 g/dL — ABNORMAL LOW (ref 13.0–17.0)
Immature Granulocytes: 0 %
Lymphocytes Relative: 40 %
Lymphs Abs: 0.9 10*3/uL (ref 0.7–4.0)
MCH: 34.2 pg — ABNORMAL HIGH (ref 26.0–34.0)
MCHC: 34.6 g/dL (ref 30.0–36.0)
MCV: 98.9 fL (ref 80.0–100.0)
Monocytes Absolute: 0.2 10*3/uL (ref 0.1–1.0)
Monocytes Relative: 9 %
Neutro Abs: 1 10*3/uL — ABNORMAL LOW (ref 1.7–7.7)
Neutrophils Relative %: 44 %
Platelets: 90 10*3/uL — ABNORMAL LOW (ref 150–400)
RBC: 3.48 MIL/uL — ABNORMAL LOW (ref 4.22–5.81)
RDW: 14.1 % (ref 11.5–15.5)
WBC: 2.3 10*3/uL — ABNORMAL LOW (ref 4.0–10.5)
nRBC: 0 % (ref 0.0–0.2)

## 2021-03-12 LAB — T-HELPER CELLS (CD4) COUNT (NOT AT ARMC)
CD4 % Helper T Cell: 49 % (ref 33–65)
CD4 T Cell Abs: 396 /uL — ABNORMAL LOW (ref 400–1790)

## 2021-03-12 LAB — COMPREHENSIVE METABOLIC PANEL
ALT: 41 U/L (ref 0–44)
AST: 59 U/L — ABNORMAL HIGH (ref 15–41)
Albumin: 3.1 g/dL — ABNORMAL LOW (ref 3.5–5.0)
Alkaline Phosphatase: 35 U/L — ABNORMAL LOW (ref 38–126)
Anion gap: 4 — ABNORMAL LOW (ref 5–15)
BUN: <5 mg/dL — ABNORMAL LOW (ref 6–20)
CO2: 25 mmol/L (ref 22–32)
Calcium: 8.1 mg/dL — ABNORMAL LOW (ref 8.9–10.3)
Chloride: 106 mmol/L (ref 98–111)
Creatinine, Ser: 0.51 mg/dL — ABNORMAL LOW (ref 0.61–1.24)
GFR, Estimated: >60 mL/min (ref >60)
Glucose, Bld: 82 mg/dL (ref 70–99)
Potassium: 3.4 mmol/L — ABNORMAL LOW (ref 3.5–5.1)
Sodium: 135 mmol/L (ref 135–145)
Total Bilirubin: 0.9 mg/dL (ref 0.3–1.2)
Total Protein: 5.7 g/dL — ABNORMAL LOW (ref 6.5–8.1)

## 2021-03-12 LAB — PHOSPHORUS: Phosphorus: 3.5 mg/dL (ref 2.5–4.6)

## 2021-03-12 LAB — TSH: TSH: 2.728 u[IU]/mL (ref 0.350–4.500)

## 2021-03-12 LAB — HEPATITIS B SURFACE ANTIGEN: Hepatitis B Surface Ag: NONREACTIVE

## 2021-03-12 LAB — VITAMIN B12: Vitamin B-12: 228 pg/mL (ref 180–914)

## 2021-03-12 LAB — HEMOGLOBIN A1C
Hgb A1c MFr Bld: 5.1 % (ref 4.8–5.6)
Mean Plasma Glucose: 99.67 mg/dL

## 2021-03-12 LAB — RPR: RPR Ser Ql: NONREACTIVE

## 2021-03-12 LAB — HEPATITIS C ANTIBODY: HCV Ab: NONREACTIVE

## 2021-03-12 LAB — FOLATE: Folate: 17.7 ng/mL (ref >5.9)

## 2021-03-12 LAB — MAGNESIUM: Magnesium: 1.7 mg/dL (ref 1.7–2.4)

## 2021-03-12 LAB — HEPATITIS B CORE ANTIBODY, TOTAL: Hep B Core Total Ab: NONREACTIVE

## 2021-03-12 MED ORDER — MAGNESIUM SULFATE 2 GM/50ML IV SOLN
2.0000 g | Freq: Once | INTRAVENOUS | Status: AC
  Administered 2021-03-12: 2 g via INTRAVENOUS
  Filled 2021-03-12: qty 50

## 2021-03-12 NOTE — Progress Notes (Signed)
Patient ID: [name], male   DOB: [birth year], 35 y.o.   MRN: [id number]   Subjective: HD#1  ON: CIWA score between 5-13. Required 4 doses of Ativan (8 mg total) throughout the night and 1 dose of zofran.   Patient reports feeling shaky this morning still, but feels this is improving. He also reports tingling in hands and feet, which he says occurs whenever he does not drink alcohol. He denies visual and tactile hallucinations. Denies nausea at this time, but reports nausea earlier in the morning for which he received zofran with good relief. He reports no further seizure like activity since admission and requests information regarding inpatient rehab facilities he may go to after discharge.  Discussed previous MDD treatment with fluoxetine and patient declines interest in restarting this medication or speaking with psychiatry.   Objective:  Vital signs in last 24 hours: Vitals:   03/12/21 0155 03/12/21 0415 03/12/21 0420 03/12/21 0608  BP: 111/76 116/75  109/77  Pulse: 81 69  69  Resp: 14 15  15   Temp: 98 F (36.7 C) 98 F (36.7 C)  98.3 F (36.8 C)  TempSrc: Oral Oral  Oral  SpO2: 94% 95%  94%  Weight:   72.2 kg   Height:       Weight change:   Intake/Output Summary (Last 24 hours) at 03/12/2021 0730 Last data filed at 03/12/2021 0153 Gross per 24 hour  Intake 2016.14 ml  Output 600 ml  Net 1416.14 ml   Physical exam: General: thin male, sitting comfortably in bed in no acute distress HEENT: MMM.  CV: RRR, NMRG, normal heart sounds, radial and DP pulses 2+ bilaterally, no lower extremity edema Pulm: Normal work of breathing, lungs clear to ausculation bilaterally Abdominal: soft non-tender, non-distended Skin: R 5th toe has purple discoloration with ulceration with no obvious drainage; covered in iodine solution Neuro: resting tremor present, oriented to person and place   CBC Latest Ref Rng & Units 03/12/2021 03/11/2021 03/11/2021  WBC 4.0 - 10.5 K/uL 2.3(L) 2.9(L) 4.1   Hemoglobin 13.0 - 17.0 g/dL 11.9(L) 11.8(L) 13.2  Hematocrit 39.0 - 52.0 % 34.4(L) 35.3(L) 38.8(L)  Platelets 150 - 400 K/uL 90(L) 101(L) 128(L)   CBC    Component Value Date/Time   WBC 2.3 (L) 03/12/2021 0357   RBC 3.48 (L) 03/12/2021 0357   HGB 11.9 (L) 03/12/2021 0357   HGB 11.1 (L) 06/15/2018 1902   HCT 34.4 (L) 03/12/2021 0357   HCT 33.0 (L) 06/15/2018 1902   PLT 90 (L) 03/12/2021 0357   PLT 235 06/15/2018 1902   MCV 98.9 03/12/2021 0357   MCV 94 06/15/2018 1902   MCV 100 11/15/2014 1810   MCH 34.2 (H) 03/12/2021 0357   MCHC 34.6 03/12/2021 0357   RDW 14.1 03/12/2021 0357   RDW 13.8 06/15/2018 1902   RDW 13.8 11/15/2014 1810   LYMPHSABS 0.9 03/12/2021 0357   LYMPHSABS 1.0 06/15/2018 1902   MONOABS 0.2 03/12/2021 0357   EOSABS 0.1 03/12/2021 0357   EOSABS 0.0 06/15/2018 1902   BASOSABS 0.0 03/12/2021 0357   BASOSABS 0.0 06/15/2018 1902   CMP Latest Ref Rng & Units 03/12/2021 03/11/2021 03/08/2021  Glucose 70 - 99 mg/dL 82 87 87  BUN 6 - 20 mg/dL 03/10/2021) 6 8  Creatinine <0(V - 1.24 mg/dL 3.71) 0.62(I 9.48)  Sodium 135 - 145 mmol/L 135 139 140  Potassium 3.5 - 5.1 mmol/L 3.4(L) 3.3(L) 3.3(L)  Chloride 98 - 111 mmol/L 106 104 104  CO2  22 - 32 mmol/L 25 23 28   Calcium 8.9 - 10.3 mg/dL 8.1(L) 8.8(L) 9.0  Total Protein 6.5 - 8.1 g/dL ) 7.0 6.7  Total Bilirubin 0.3 - 1.2 mg/dL 0.9 0.4 0.5  Alkaline Phos 38 - 126 U/L 35(L) 44 46  AST 15 - 41 U/L 59(H) 67(H) 54(H)  ALT 0 - 44 U/L 41 46(H) 45(H)   DG L foot IMPRESSION: 1. Small plantar and Achilles calcaneal spurs.  DG R foot IMPRESSION: 1. Small plantar Achilles calcaneal spurs.  Component Ref Range & Units 03:57 3 mo ago  HCV Ab NON REACTIVE NON REACTIVE  NON REACTIVE CM    Component Ref Range & Units 03:57 3 mo ago 1 yr ago  Hepatitis B Surface Ag NON REACTIVE NON REACTIVE  NON REACTIVE  NON-REACTIVE    B Core Total Ab NON REACTIVE NON REACTIVE    Component Ref Range & Units 1 d ago 1 yr ago 2 yr ago 3 yr  ago  RPR Ser Ql NON REACTIVE NON REACTIVE  NON-REACTIVE R  Non Reactive R, CM  Non Reactive     Ref Range & Units 03:57  Vitamin B-12 180 - 914 pg/mL 228    Component Ref Range & Units 03:57 1 yr ago  Folate >5.9 ng/mL 17.7  4.9Low CM    Component Ref Range & Units 03:57  (03/12/21) 1 d ago  (03/11/21) 3 mo ago  (12/08/20) 3 mo ago  (12/07/20) 3 mo ago  (12/05/20) 4 mo ago  (10/19/20) 8 mo ago  (07/06/20)  Magnesium 1.7 - 2.4 mg/dL 1.7  2.2 CM  07/08/20 CM  1.8 CM  1.9 CM  2.1 CM  2.0 CM    Component Ref Range & Units 03:57  (03/12/21) 1 d ago  (03/11/21) 3 mo ago  (12/07/20) 3 mo ago  (12/05/20) 4 mo ago  (10/19/20) 9 mo ago  (06/13/20) 10 mo ago  (04/18/20)  Phosphorus 2.5 - 4.6 mg/dL 3.5  3.9 CM  3.2 CM  4.1 CM  3.7 CM  4.6 CM  3.3 CM     Assessment/Plan:  Active Problems:   Alcohol withdrawal (HCC)  CAYMEN DUBRAY is a 35 year old male with a history of uncontrolled HIV, polysubstance use, alcohol withdrawal seizures, MDD with SI and HI, subdural hematoma, GI hemorrhage, and pancreatitis who presented to the ED due to seizure like activity admitted for alcohol withdrawal.   Possible seizure secondary to alcohol withdrawal  Alcohol Withdrawal and polysubstance use Patient had CIWA scores between 5-13 overnight, and required 4 doses of ativan (8 mg) and 1 dose of zofran. No further seizure like activity. No visual or tactile disturbances. Patient remains afebrile, normotensive with normal HR. Low concern for delirium tremens or alcoholic hallucinosis at this time. However, considering that the patient began withdrawing while EtOH level was still 247 and given history he is very high risk for decompensation.  - continue CIWA with ativan  - continue librium taper  - continue thiamine, multivitamin, and B12 - continue zofran PRN  - continue cardiac monitoring - monitor CMP and CBC  - social work consult: patient requests information about inpatient rehab facilities he may go to after  discharge  Foot ulcers Xray of bilateral feet yesterday showed no evidence of osteomyelitis or abscess. Seen by wound care this morning, appreciate recommendations. - apply iodine to wounds and areas of discoloration; then allow to air dry  - tylenol PRN for pain   HIV Patient  previously taking genvoya, but states he has not taken it in the past few weeks. Previously followed by Dr. Lorenso Courier in 2020 and Dr. Durwin Nora in 2021. Previously he had consistent access to medications when he was incarcerated, but at this time he does not have consistent access to medications.  - CD4 count and HIV quant pending - Genvoya per pharmacy dosing - will need follow up with ID after discharge  High risk sexual activity Patient reports frequent unprotected penetrative sex with men.  - RPR negative ruling out syphilis - HCV negative - HBV core antibody and surface antigen negative indicating no prior or current infection - HBV surface antibody pending, immune status unknown at this time.   Hypokalemia Hypocalcemia Potassium improving, now at 3.4 from 3.3 after 3 potassium infusions yesterday. Persistent hypocalcemia with corrected calcium at 8.8 today. Borderline prolonged QT yesterday. No abnormalities noted on tele. Phosphorus and magnesium normal. Mag low normal though.  - continue to monitor for refeeding syndrome   - replete mag 2 g push  - recheck CMP after mag repletion; will consider potassium/calcium repletion at that time  Macrocytic anemia Macrocytosis resolved on AM CBC. Normal folate and B12 levels. Anemia likely secondary to liver disease due to alcohol use disorder and malnutrition in setting of low/normal B12. Hgb stable on CBC today.  - continue to monitor with CBC  Thrombocytopenia Patient has history of persistent thrombocytopenia. Today down from 101 to 90. Possibly secondary to HIV and/or liver disease secondary to alcohol use.  - ART restarted as described above - monitor with  CBC  MDD Previously on fluoxetine 20 mg daily 06/2020-09/2020. Patient declines restarting fluoxitine and is not interested in psychiatric care at this time.  - consider psychiatry referral if symptoms worsen - will need outpatient follow up after discharge  Diet: regular DVT ppx: lovenox Code: full  IVF: none Dispo: pending medical stabilization    LOS: 1 day   Laverna Peace, Medical Student 03/12/2021, 7:30 AM

## 2021-03-12 NOTE — Progress Notes (Signed)
Pt educated on the need to stop his alcohol abuse in order to prevent seizures and other related complications. Pt verbalized an understanding and a willing spirit to the possibility. Pt stated that "I am going to quite this before I die". He also mentioned that he had been hospitalized for this admitted dx multiple of other times, prior to his quote.

## 2021-03-12 NOTE — Consult Note (Signed)
WOC Nurse Consult Note: Patient is receiving care in [facility] [id number]. Reason for Consult: wounds on feet Wound type: the patient is homeless, has polysubstance abuse, and HIV. The exact type is unclear at this time. Pressure Injury POA: Yes/No/NA Measurement: see photos of feet Wound bed: Drainage (amount, consistency, odor)  Periwound: Dressing procedure/placement/frequency:  Apply iodine from the swabsticks or swab pads from clean utility to all foot/toe wounds and areas of discoloration.  Allow to air dry. WOC nurse will not follow at this time.  Please re-consult the WOC team if needed.  Helmut Muster, RN, MSN, CWOCN, CNS-BC, pager [phone]

## 2021-03-13 ENCOUNTER — Encounter: Payer: Self-pay | Admitting: Infectious Disease

## 2021-03-13 ENCOUNTER — Other Ambulatory Visit (HOSPITAL_COMMUNITY): Payer: Self-pay

## 2021-03-13 LAB — CBC
HCT: 38.1 % — ABNORMAL LOW (ref 39.0–52.0)
Hemoglobin: 13 g/dL (ref 13.0–17.0)
MCH: 33.8 pg (ref 26.0–34.0)
MCHC: 34.1 g/dL (ref 30.0–36.0)
MCV: 99 fL (ref 80.0–100.0)
Platelets: 100 10*3/uL — ABNORMAL LOW (ref 150–400)
RBC: 3.85 MIL/uL — ABNORMAL LOW (ref 4.22–5.81)
RDW: 13.7 % (ref 11.5–15.5)
WBC: 2.3 10*3/uL — ABNORMAL LOW (ref 4.0–10.5)
nRBC: 0 % (ref 0.0–0.2)

## 2021-03-13 LAB — COMPREHENSIVE METABOLIC PANEL
ALT: 35 U/L (ref 0–44)
AST: 38 U/L (ref 15–41)
Albumin: 3.1 g/dL — ABNORMAL LOW (ref 3.5–5.0)
Alkaline Phosphatase: 36 U/L — ABNORMAL LOW (ref 38–126)
Anion gap: 8 (ref 5–15)
BUN: 5 mg/dL — ABNORMAL LOW (ref 6–20)
CO2: 25 mmol/L (ref 22–32)
Calcium: 8.6 mg/dL — ABNORMAL LOW (ref 8.9–10.3)
Chloride: 102 mmol/L (ref 98–111)
Creatinine, Ser: 0.58 mg/dL — ABNORMAL LOW (ref 0.61–1.24)
GFR, Estimated: >60 mL/min (ref >60)
Glucose, Bld: 85 mg/dL (ref 70–99)
Potassium: 3.5 mmol/L (ref 3.5–5.1)
Sodium: 135 mmol/L (ref 135–145)
Total Bilirubin: 1 mg/dL (ref 0.3–1.2)
Total Protein: 5.9 g/dL — ABNORMAL LOW (ref 6.5–8.1)

## 2021-03-13 LAB — HIV-1 RNA QUANT-NO REFLEX-BLD
HIV 1 RNA Quant: 790 copies/mL
LOG10 HIV-1 RNA: 2.898 log10copy/mL

## 2021-03-13 LAB — HEPATITIS B SURFACE ANTIBODY, QUANTITATIVE: Hep B S AB Quant (Post): <3.1 m[IU]/mL — ABNORMAL LOW (ref >9.9)

## 2021-03-13 LAB — TROPONIN I (HIGH SENSITIVITY): Troponin I (High Sensitivity): 3 ng/L (ref <18)

## 2021-03-13 MED ORDER — ELVITEG-COBIC-EMTRICIT-TENOFAF 150-150-200-10 MG PO TABS
1.0000 | ORAL_TABLET | Freq: Every day | ORAL | Status: DC
Start: 2021-03-14 — End: 2021-03-13

## 2021-03-13 MED ORDER — CHLORDIAZEPOXIDE HCL 25 MG PO CAPS
50.0000 mg | ORAL_CAPSULE | Freq: Four times a day (QID) | ORAL | Status: DC
  Administered 2021-03-13 – 2021-03-14 (×4): 50 mg via ORAL
  Filled 2021-03-13 (×4): qty 2

## 2021-03-13 NOTE — Hospital Course (Addendum)
Gary [name] is a 35 year old male with a history of polysubstance use, alcohol use disorder with prior withdrawal seizures, subdural hematoma, SI and HI admitted for alcohol withdrawal.   Prolonged alcohol withdrawal - complicated by seizures and alcoholic hallucinosis  Gary [name] presented to the ED on 03/11/2021 due to witnessed seizure secondary to decreased alcohol intake. In the ED EtOH level was noted to be 396 and he was given 3 mg of ativan and 25 mg of libirum. CT head was done given witnessed seizure with concern for head trauma, but was unremarkable.  On admission he was initially started on librium taper 25 mg Q6H, but required a significant amount of additional ativan on top of this so librium was increased to 50 mg Q6H on 05/05 This was then tapered to 25 mg Q6H on 05/06, 25 mg TID on 05/07, then 25 mg BID on 05/09, finally 25 mg were given in a single dose on 05/10. He was then discharge on 05/11.   Transaminitis  LFTs normal on admission, but slowly increased during course of admission. Previously increased intermittently since 2015. Known history of fatty liver disease. [place] 05/08 showed area of hyperechogenicity in L lobe of liver consistent with fat accumulation v poorly defined meningioma, new since last RUQ [place] in  October 2021. MRI on 05/09 showed no cirrhosis or suspicious liver lesion. US findings presumed to be artifact or related MR occult focal steatosis.   HIV Patient previously on Genvoya, but has not been taking recently due to financial constraints. Previously followed with Dr. Lorenso Courier in 2020 and Dr. Durwin Nora in 2021. Seen by ID while in hospital and set up with outpatient follow up and Genvoya reinitiated.  CD4 count 396; viral load 790.  Foot ulcers Patient had purplish discoloration over his right 1st and 5th digits on the plantar surface with ulceration over the 5th digit. This is believed to be secondary to wearing damp boots for quite some time.Xray of bilateral  feet showed no evidence of osteomyelitis or abscess. Seen by wound care who recommended to apply iodine to wounds and areas of discoloration; then allow to air dry. On day of admission foot ulcers were healed over and not causing any discomfort.   Anxiety  Throughout admission patient reported anxiety and endorsed history of anxiety as the reason he drinks alcohol. Given his polysubstance use benzos are not a good choice for him. His persistent sinus bradycardia and hypotension make a beta blocker a poor choice as well. He would qualify for SSRI treatment especially given comorbid depression. However, patient declines treatment with SSRIs.   Chest pain Patient was bradycardic and borderline hypotensive throughout admission and complained of intermittent chest pain. He reported that his usually gets chest pain whenever he goes through alcohol withdrawal and also with his anxiety. Troponins were negative and EKGs unchanged from prior hospitalizations without any evidence of ischemia or arrhythmia. Hypotension and bradycardia were thought to be due to high doses of benzodiazepines given for his withdrawal. However, as mediations were weaned bradycardia and hypotension persisted. Orthostatic vitals were done and heart rate responded appropriately and BP remained stable in 90s/60s. Echo was completed which showed ***. Chest pain thought to be related to anxiety, with low baseline bp and HR.

## 2021-03-13 NOTE — Progress Notes (Signed)
Patient ID: Gary [name], male   DOB: Nov 14, 1985, 35 y.o.   MRN: [id number]   Subjective: HD#2  ON: CIWA score 2-13; mainly 12-13. Required total 10 mg ativan since yesterday evening in 4 doses (x3 2 mg, x1 4 mg) as well as 2 doses of hydroxyzine (50 mg total). Stable vitals throughout the night. Remains afebrile, normotensive, with normal HR and RR.  Visual and auditory disturbances as well as agitation reported overnight with CIWA scoring.   This morning patient reports improved tremor. He is reporting occasional visual hallucinations (wall swirling), denies auditory hallucinations. He reports generalized itching and tingling present since he begin withdrawing. He also reports L sided chest pain and shortness of breath that he says occurs whenever he begins withdrawing from alcohol.  Objective:  Vital signs in last 24 hours: Vitals:   03/12/21 1700 03/12/21 1931 03/13/21 0012 03/13/21 0331  BP: 112/76 115/77 107/76 107/78  Pulse: 67 73 (!) 57 61  Resp: 17 18 17 17   Temp:  98.3 F (36.8 C) 98.1 F (36.7 C) 98.1 F (36.7 C)  TempSrc:  Oral Oral Oral  SpO2: 97% 98% 97% 97%  Weight:      Height:       Weight change:   Intake/Output Summary (Last 24 hours) at 03/13/2021 05/13/2021 Last data filed at 03/13/2021 0100 Gross per 24 hour  Intake --  Output 3100 ml  Net -3100 ml    General: thin man, appears uncomfortable sitting up in bed CV: normal rate and rhythm, no murmurs rubs or gallops Pulm: no increased work of breathing, lungs CTA bilaterally  GI: abdomen soft, non tender Neuro: resting termor present bilaterally, however much improved from yesterday; oriented to self and situation; states it is April and he is unaware of the year, knows he is hospitalized for alcohol withdrawal but thinks he is in [place]  CBC Latest Ref Rng & Units 03/13/2021 03/12/2021 03/11/2021  WBC 4.0 - 10.5 K/uL 2.3(L) 2.3(L) 2.9(L)  Hemoglobin 13.0 - 17.0 g/dL 05/11/2021 11.9(L) 11.8(L)  Hematocrit 39.0 - 52.0  % 38.1(L) 34.4(L) 35.3(L)  Platelets 150 - 400 K/uL 100(L) 90(L) 101(L)   CMP Latest Ref Rng & Units 03/13/2021 03/12/2021 03/11/2021  Glucose 70 - 99 mg/dL 85 82 87  BUN 6 - 20 mg/dL 5(L) 05/11/2021) 6  Creatinine 0.61 - 1.24 mg/dL <2(K) 0.25(K) 2.70(W  Sodium 135 - 145 mmol/L 135 135 139  Potassium 3.5 - 5.1 mmol/L 3.5 3.4(L) 3.3(L)  Chloride 98 - 111 mmol/L 102 106 104  CO2 22 - 32 mmol/L 25 25 23   Calcium 8.9 - 10.3 mg/dL 2.37) 8.1(L) 8.8(L)  Total Protein 6.5 - 8.1 g/dL 5.9(L) 5.7(L) 7.0  Total Bilirubin 0.3 - 1.2 mg/dL 1.0 0.9 0.4  Alkaline Phos 38 - 126 U/L 36(L) 35(L) 44  AST 15 - 41 U/L 38 59(H) 67(H)  ALT 0 - 44 U/L 35 41 46(H)   Component Ref Range & Units 1 d ago 1 yr ago  Hepatitis B-Post Immunity>9.9 mIU/mL <3.1Low  <5Low R, CM     Ref Range & Units 09:39  (03/13/21) 4 mo ago  (10/19/20) 4 mo ago  (10/19/20) 5 mo ago  (10/04/20) 5 mo ago  (10/04/20) 10 mo ago  (05/06/20) 1 yr ago  (07/25/19)  Troponin I (High Sensitivity) <18 ng/L 3  44High CM  54High CM  18High CM  39High CM  8 CM  14 CM    Component Ref Range & Units 1 d ago  1 yr ago  CD4 T Cell Abs 400 - 1,790 /uL 396Low  320Low   CD4 % Helper T Cell 33 - 65 % 49  29Low   Assessment/Plan:  Active Problems:   Alcohol withdrawal (HCC)   Alcohol withdrawal seizure without complication (HCC)  SYON TEWS is a 35 year old male with a history of uncontrolled HIV, polysubstance use, alcohol withdrawal seizures, MDD with SI and HI, subdural hematoma, GI hemorrhage, and pancreatitis who presented to the ED due to seizure like activity admitted for alcohol withdrawal.   Possible seizure secondary to alcohol withdrawal  Alcohol Withdrawal and polysubstance use Patient had CIWA scores between 2-13 overnight (mostly 12-13 though), and required 4 doses of ativan (10 mg total) and 2 doses of hydroxyzine (50 mg total). No further seizure like activity. Visual or tactile disturbances reported overnight with CIWA  scores. Patient remains afebrile, normotensive with normal HR and RR. Concerned for alcoholic hallucinosis at this time given hallucinations and agitaion. Low concern for DT at this time in setting of normal vital signs. However, considering that the patient began withdrawing with high EtOH level and given history he is very high risk for decompensation.  - increase CIWA with ativan to q1hr checks and q1hr ativan PRN - increase librium to 50 mg in setting of persistently high CIWA score and alcoholic hallucinosis   - continue thiamine, multivitamin, and B12 - continue zofran PRN  - continue cardiac monitoring - monitor CMP and CBC  - social work consult: patient requests information about inpatient rehab facilities he may go to after discharge. Social work plans to see him today or tomorrow based off of patient's mentation.    Chest Pain Patient reports chest pain today and shortness of breath. He states this occurs whenever he goes through withdrawal from alcohol. Troponin this morning normal at 3. No evidence of acute ischemia or arrhythmia on EKG from yesterday. No abnormalities noted on tele. Chest pain not believed to be due to acute cardiac pathology, likely chronic cardiac pathology v anxiety v related to alcohol withdrawal.  - consider echo as outpatient to rule out alcoholic cardiomyopathy  - continue cardiac monitoring   HIV Patient previously taking genvoya, but states he has not taken it in the past few weeks. Previously followed by Dr. Lorenso Courier in 2020 and Dr. Durwin Nora in 2021. Previously he had consistent access to medications when he was incarcerated, but at this time he does not have consistent access to medications. CD4 count slightly increased from 320 to 396. Spoke with ID this morning and patient was okayed to restart Genvoya and plan to see patient today to plug him back in with their office for outpatient follow up.  - HIV quant pending - ID consult  - reinitiate Genvoya  - will  need follow up with ID after discharge  High risk sexual activity Patient reports frequent unprotected penetrative sex with men.  - RPR negative ruling out syphilis - HCV negative - HBV core antibody and surface antigen negative indicating no prior or current infection - HBV surface antibody low titer indicating no immunity to HBV. Will need outpatient follow up for immunization.   Hypokalemia - resolved  Hypocalcemia - resolved  Magnesium repleted yesterday. Today potassium today normal at 3.5 and corrected calcium normal at 9.3. - monitor CMP  Thrombocytopenia - improving  Patient has history of persistent thrombocytopenia. Today down from 101 to 90. Possibly secondary to HIV and/or liver disease secondary to alcohol use.  -  ART restarted as described above - monitor with CBC  Diet: regular DVT ppx: lovenox Code: full  IVF: none Dispo: pending medical stabilization     LOS: 2 days   Laverna Peace, Medical Student 03/13/2021, 6:47 AM

## 2021-03-13 NOTE — Progress Notes (Addendum)
CM has provided him a list of residential inpatient drug/ alcohol rehabs. Pt is to call and see if they have a place for him. CM will f/u with him later today. CM will provide the rehab with needed information when a place if found.  TOC following.  1400: CM met with the patient again and he has reached out to 1 of the inpatient rehabs. He states [facility] is moving and wont have beds until the end of the month. CM has encouraged him to reach out to the other rehabs. CM will f/u in am.

## 2021-03-14 LAB — COMPREHENSIVE METABOLIC PANEL WITH GFR
ALT: 40 U/L (ref 0–44)
AST: 53 U/L — ABNORMAL HIGH (ref 15–41)
Albumin: 3.3 g/dL — ABNORMAL LOW (ref 3.5–5.0)
Alkaline Phosphatase: 28 U/L — ABNORMAL LOW (ref 38–126)
Anion gap: 7 (ref 5–15)
BUN: 5 mg/dL — ABNORMAL LOW (ref 6–20)
CO2: 23 mmol/L (ref 22–32)
Calcium: 8.8 mg/dL — ABNORMAL LOW (ref 8.9–10.3)
Chloride: 104 mmol/L (ref 98–111)
Creatinine, Ser: 0.6 mg/dL — ABNORMAL LOW (ref 0.61–1.24)
GFR, Estimated: >60 mL/min
Glucose, Bld: 96 mg/dL (ref 70–99)
Potassium: 4.2 mmol/L (ref 3.5–5.1)
Sodium: 134 mmol/L — ABNORMAL LOW (ref 135–145)
Total Bilirubin: UNDETERMINED mg/dL (ref 0.3–1.2)
Total Protein: 5.7 g/dL — ABNORMAL LOW (ref 6.5–8.1)

## 2021-03-14 LAB — COMPREHENSIVE METABOLIC PANEL
ALT: 40 U/L (ref 0–44)
AST: 43 U/L — ABNORMAL HIGH (ref 15–41)
Albumin: 3.1 g/dL — ABNORMAL LOW (ref 3.5–5.0)
Alkaline Phosphatase: 33 U/L — ABNORMAL LOW (ref 38–126)
Anion gap: 7 (ref 5–15)
BUN: 6 mg/dL (ref 6–20)
CO2: 24 mmol/L (ref 22–32)
Calcium: 9.1 mg/dL (ref 8.9–10.3)
Chloride: 106 mmol/L (ref 98–111)
Creatinine, Ser: 0.6 mg/dL — ABNORMAL LOW (ref 0.61–1.24)
GFR, Estimated: >60 mL/min (ref >60)
Glucose, Bld: 112 mg/dL — ABNORMAL HIGH (ref 70–99)
Potassium: 3.8 mmol/L (ref 3.5–5.1)
Sodium: 137 mmol/L (ref 135–145)
Total Bilirubin: 0.6 mg/dL (ref 0.3–1.2)
Total Protein: 5.7 g/dL — ABNORMAL LOW (ref 6.5–8.1)

## 2021-03-14 LAB — CBC
HCT: 39.4 % (ref 39.0–52.0)
Hemoglobin: 13.4 g/dL (ref 13.0–17.0)
MCH: 33.7 pg (ref 26.0–34.0)
MCHC: 34 g/dL (ref 30.0–36.0)
MCV: 99 fL (ref 80.0–100.0)
Platelets: 127 10*3/uL — ABNORMAL LOW (ref 150–400)
RBC: 3.98 MIL/uL — ABNORMAL LOW (ref 4.22–5.81)
RDW: 13.7 % (ref 11.5–15.5)
WBC: 3 10*3/uL — ABNORMAL LOW (ref 4.0–10.5)
nRBC: 0 % (ref 0.0–0.2)

## 2021-03-14 MED ORDER — CHLORDIAZEPOXIDE HCL 25 MG PO CAPS
25.0000 mg | ORAL_CAPSULE | Freq: Four times a day (QID) | ORAL | Status: DC
  Administered 2021-03-14 (×2): 25 mg via ORAL
  Filled 2021-03-14 (×2): qty 1

## 2021-03-14 NOTE — Progress Notes (Signed)
Patient ID: Gary [name], male   DOB: 1986-08-15, 35 y.o.   MRN: [id number]   Subjective:  On: patient required only 6 mg of ativan over night and had CIWA score mainly 0 throughout night except right before sleep and early this morning.   Patient reports continued headache, nausea, and tremor but states they are slowly improving. He states he is having anxiety, but that this is a constant problem for him. He has previously declined psychiatry referral. He states he is having continued L sided chest pain but that it is improving which is the normal course of his chest pain associated with alcohol withdrawal.   Objective:  Vital signs in last 24 hours: Vitals:   03/13/21 2355 03/14/21 0442 03/14/21 0804 03/14/21 1104  BP: 112/73 (!) 100/58 (!) 96/59 93/60  Pulse: 62 63 66 73  Resp: 18 16 20 16   Temp: 97.9 F (36.6 C) 98.2 F (36.8 C) 98.3 F (36.8 C) 98.3 F (36.8 C)  TempSrc: Oral Oral Oral Oral  SpO2: 97% 98% 97% 98%  Weight:      Height:       Weight change:   Intake/Output Summary (Last 24 hours) at 03/14/2021 1314 Last data filed at 03/13/2021 2300 Gross per 24 hour  Intake --  Output 1351 ml  Net -1351 ml   Physical Exam Cardiovascular:     Rate and Rhythm: Normal rate and regular rhythm.     Pulses: Normal pulses.     Heart sounds: Normal heart sounds.  Pulmonary:     Effort: Pulmonary effort is normal.     Breath sounds: Normal breath sounds.  Abdominal:     General: Abdomen is flat. There is no distension.     Palpations: Abdomen is soft.  Skin:    General: Skin is warm and dry.  Neurological:     Mental Status: He is alert.     Motor: Tremor present.     Comments: Patient oriented to self, place, and situation; when asked what month and year it is he stated April, 2021  Psychiatric:        Attention and Perception: He does not perceive visual hallucinations.        Mood and Affect: Affect is flat.    CBC Latest Ref Rng & Units 03/14/2021 03/13/2021 03/12/2021   WBC 4.0 - 10.5 K/uL 3.0(L) 2.3(L) 2.3(L)  Hemoglobin 13.0 - 17.0 g/dL 05/12/2021 91.7 11.9(L)  Hematocrit 39.0 - 52.0 % 39.4 38.1(L) 34.4(L)  Platelets 150 - 400 K/uL 127(L) 100(L) 90(L)   CMP Latest Ref Rng & Units 03/14/2021 03/14/2021 03/13/2021  Glucose 70 - 99 mg/dL 05/13/2021) 96 85  BUN 6 - 20 mg/dL 6 5(L) 5(L)  Creatinine 0.61 - 1.24 mg/dL 056(P) 7.94(I) 0.16(P)  Sodium 135 - 145 mmol/L 137 134(L) 135  Potassium 3.5 - 5.1 mmol/L 3.8 4.2 3.5  Chloride 98 - 111 mmol/L 106 104 102  CO2 22 - 32 mmol/L 24 23 25   Calcium 8.9 - 10.3 mg/dL 9.1 5.37(S) )  Total Protein 6.5 - 8.1 g/dL 8.2(L) 5.7(L) 5.9(L)  Total Bilirubin 0.3 - 1.2 mg/dL 0.6 QUANTITY NOT SUFFICIENT, UNABLE TO PERFORM TEST 1.0  Alkaline Phos 38 - 126 U/L 33(L) 28(L) 36(L)  AST 15 - 41 U/L 43(H) 53(H) 38  ALT 0 - 44 U/L 40 40 35   Component Ref Range & Units 2 d ago  (03/12/21) 3 mo ago  (12/05/20) 1 yr ago  (02/13/20) 1 yr ago  (09/19/19)  2 yr ago  (06/15/18)  HIV 1 RNA Quant copies/mL 790 VC  20 VC, CM  <20 NOT DETECTED R  17,900High  96,400 VC, CM   Component Ref Range & Units 1 d ago  (03/13/21) 4 mo ago  (10/19/20) 4 mo ago  (10/19/20) 5 mo ago  (10/04/20) 5 mo ago  (10/04/20) 10 mo ago  (05/06/20) 1 yr ago  (07/25/19)  Troponin I (High Sensitivity) <18 ng/L 3  44High CM  54High CM  18High CM  39High CM  8 CM  14 CM     Assessment/Plan:  Active Problems:   Alcohol withdrawal (HCC)   Alcohol withdrawal seizure without complication (HCC) Gary [name] a 35 year old male with a history of uncontrolled HIV, polysubstance use, alcohol withdrawal seizures, MDD with SI and HI, subdural hematoma, GI hemorrhage, and pancreatitis who presented to the ED due to seizure like activity admitted for alcohol withdrawal.   Possible seizure secondary to alcohol withdrawal  Alcohol Withdrawal and polysubstance use Patient had CIWA scores between 0-15 overnight (mostly 0 though except before bed and early this morning), and  required 6 mg ativan total overnight. No further seizure like activity. Patient denies any further visual hallucinations. Patient remains afebrile, normotensive with normal HR and RR. Low concern for alcoholic hallucinosis at this time as hallucinations and agitation seems to have resolved. Low concern for DT at this time in setting of normal vital signs. However, considering that thepatientbegan withdrawing with high EtOH level and given history heisvery highriskfor decompensation.  - decreased CIWA q2h checks - continue ativan q1h PRN  - decrease librium to 25 mg q6h  - continue thiamine, multivitamin, and B12 - continue zofran PRN  - continue cardiac monitoring - monitor CMP and CBC  - social work consult: patient has called at least one of the rehab facilities from the list he was given. SW plans to follow up with patient this afternoon regarding placement and rehab plans.   Chest Pain Patient reports improved chest pain. He states this occurs whenever he goes through withdrawal from alcohol. Troponin normal at 3. No evidence of acute ischemia or arrhythmia on EKG from admisison and no changes on EKG yesterday. No abnormalities noted on tele. Chest pain not believed to be due to acute cardiac pathology, likely chronic cardiac pathology v anxiety v related to alcohol withdrawal.  - consider echo as outpatient to rule out alcoholic cardiomyopathy  - continue cardiac monitoring   HIV Patient previously taking genvoya, but states he has not taken it in the past few weeks. Previously followed by Dr. Lorenso Courier in 2020 and Dr. Durwin Nora in 2021. Previously he had consistent access to medications when he was incarcerated, but at this time he does not have consistent access to medications. CD4 count slightly increased from 320 to 396. Spoke with ID saw patient yesterday to plug him back in with their office for outpatient follow up. HIV quant 790.  - will need follow up with ID after discharge;  appointment scheduled - will reinitiate ART at follow up next week since he has not been seen by ID in about a year and has been off medication for so long now  Thrombocytopenia - improving  Patient has history of persistent thrombocytopenia. Today down from 101 to 90. Possibly secondary to HIV and/or liver disease secondary to alcohol use.  - monitor with CBC  Diet: regular DVT ppx: lovenox Code: full  IVF: none Dispo:pending medical stabilization; likely next day or  2     LOS: 3 days   Laverna Peace, Medical Student 03/14/2021, 1:14 PM

## 2021-03-15 LAB — CBC
HCT: 38.4 % — ABNORMAL LOW (ref 39.0–52.0)
Hemoglobin: 12.9 g/dL — ABNORMAL LOW (ref 13.0–17.0)
MCH: 33.5 pg (ref 26.0–34.0)
MCHC: 33.6 g/dL (ref 30.0–36.0)
MCV: 99.7 fL (ref 80.0–100.0)
Platelets: 146 10*3/uL — ABNORMAL LOW (ref 150–400)
RBC: 3.85 MIL/uL — ABNORMAL LOW (ref 4.22–5.81)
RDW: 14.1 % (ref 11.5–15.5)
WBC: 3.8 10*3/uL — ABNORMAL LOW (ref 4.0–10.5)
nRBC: 0 % (ref 0.0–0.2)

## 2021-03-15 LAB — COMPREHENSIVE METABOLIC PANEL
ALT: 55 U/L — ABNORMAL HIGH (ref 0–44)
AST: 71 U/L — ABNORMAL HIGH (ref 15–41)
Albumin: 3.2 g/dL — ABNORMAL LOW (ref 3.5–5.0)
Alkaline Phosphatase: 33 U/L — ABNORMAL LOW (ref 38–126)
Anion gap: 4 — ABNORMAL LOW (ref 5–15)
BUN: 5 mg/dL — ABNORMAL LOW (ref 6–20)
CO2: 25 mmol/L (ref 22–32)
Calcium: 8.8 mg/dL — ABNORMAL LOW (ref 8.9–10.3)
Chloride: 107 mmol/L (ref 98–111)
Creatinine, Ser: 0.6 mg/dL — ABNORMAL LOW (ref 0.61–1.24)
GFR, Estimated: >60 mL/min (ref >60)
Glucose, Bld: 116 mg/dL — ABNORMAL HIGH (ref 70–99)
Potassium: 3.4 mmol/L — ABNORMAL LOW (ref 3.5–5.1)
Sodium: 136 mmol/L (ref 135–145)
Total Bilirubin: 0.6 mg/dL (ref 0.3–1.2)
Total Protein: 5.9 g/dL — ABNORMAL LOW (ref 6.5–8.1)

## 2021-03-15 MED ORDER — CHLORDIAZEPOXIDE HCL 25 MG PO CAPS
25.0000 mg | ORAL_CAPSULE | Freq: Three times a day (TID) | ORAL | Status: DC
  Administered 2021-03-15 – 2021-03-16 (×6): 25 mg via ORAL
  Filled 2021-03-15 (×7): qty 1

## 2021-03-15 MED ORDER — POTASSIUM CHLORIDE CRYS ER 20 MEQ PO TBCR
20.0000 meq | EXTENDED_RELEASE_TABLET | Freq: Two times a day (BID) | ORAL | Status: AC
  Administered 2021-03-15 – 2021-03-16 (×4): 20 meq via ORAL
  Filled 2021-03-15 (×4): qty 1

## 2021-03-15 NOTE — Progress Notes (Addendum)
     Subjective: Doing well, feeling better.  Tremors improving.  Interested in help getting set up with rehab for AUD.   Objective:  Vital signs in last 24 hours: Vitals:   03/14/21 2329 03/14/21 2340 03/15/21 0140 03/15/21 0347  BP:   92/62 94/66  Pulse: 68 (!) 55 60 (!) 50  Resp: 16 18 16 16   Temp:    98.1 F (36.7 C)  TempSrc:    Oral  SpO2: 99% 97% 96% 95%  Weight:      Height:       Gen: Thin gentleman, flat affect Eyes: Anicteric sclerae CV: RR, NR, no murmur Pulm: CTAB, breathing comfortably on room air Skin: Foot wound covered, reported to be dryer  Assessment/Plan:  Alcohol withdrawal (HCC) Alcohol withdrawal seizure without complication (HCC) - CIWA scores decreasing (0-9, most recently remained 0), received 1mg  of ativan overnight - Decrease librium to 25mg  TID - Decrease to BID tomorrow if continues to do well - AUD rehab desired - [facility] is his preferred site, if not available, he will look into other sites.   - Likely discharge with plan to enter rehab Monday or Tuesday of this week.  - Continue folate, MVI and thiamine - Decreased fluid to 75cc/hr, can be discontinued tomorrow if continuing to do well.   HIV - Continue genvoya - HIV quant 790 - OP follow up with ID  Foot wound due to poor foot wear - Improving, will need to undress and take picture of wound tomorrow - Continue wound care  Chest pain - resolved - Consider outpatient TTE if warranted  Transaminitis - AST/ALT mildly elevated today compared to yesterday - HBV/HCV negative from admission - Ultrasound liver showed fatty liver from 2021 - Consider repeat liver ultrasound if LFTs continue to rise.  - Check hepatic panel in the AM  Hypokalemia - K is 3.4 today, replace orally.   Dispo: Anticipated discharge in approximately 1-2 day(s).   Saturday, MD 03/15/2021, 8:00 AM

## 2021-03-16 ENCOUNTER — Inpatient Hospital Stay (HOSPITAL_COMMUNITY): Payer: Self-pay

## 2021-03-16 LAB — COMPREHENSIVE METABOLIC PANEL
ALT: 78 U/L — ABNORMAL HIGH (ref 0–44)
AST: 74 U/L — ABNORMAL HIGH (ref 15–41)
Albumin: 3.3 g/dL — ABNORMAL LOW (ref 3.5–5.0)
Alkaline Phosphatase: 30 U/L — ABNORMAL LOW (ref 38–126)
Anion gap: 5 (ref 5–15)
BUN: <5 mg/dL — ABNORMAL LOW (ref 6–20)
CO2: 27 mmol/L (ref 22–32)
Calcium: 9.2 mg/dL (ref 8.9–10.3)
Chloride: 104 mmol/L (ref 98–111)
Creatinine, Ser: 0.7 mg/dL (ref 0.61–1.24)
GFR, Estimated: >60 mL/min (ref >60)
Glucose, Bld: 95 mg/dL (ref 70–99)
Potassium: 4.2 mmol/L (ref 3.5–5.1)
Sodium: 136 mmol/L (ref 135–145)
Total Bilirubin: 0.5 mg/dL (ref 0.3–1.2)
Total Protein: 6 g/dL — ABNORMAL LOW (ref 6.5–8.1)

## 2021-03-16 IMAGING — US US ABDOMEN LIMITED RUQ/ASCITES
1 series · 13 of 25 positions shown · non-contrast
Comparison: CT abdomen pelvis, [DATE]. Abdomen ultrasound,
[DATE].

CLINICAL DATA: Transaminitis.

EXAM:
ULTRASOUND ABDOMEN LIMITED RIGHT UPPER QUADRANT

[Series 1: us abdomen limited ruq (liver/gb) · 13 of 59 slices shown]
[im 1/59]
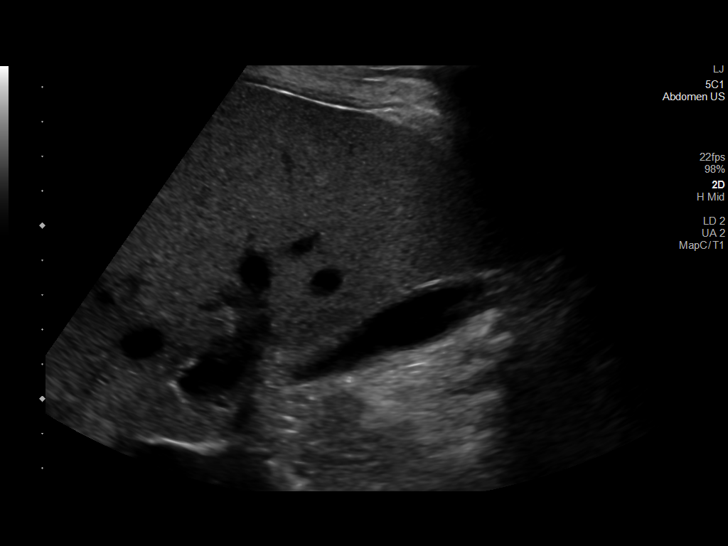
[im 5/59]
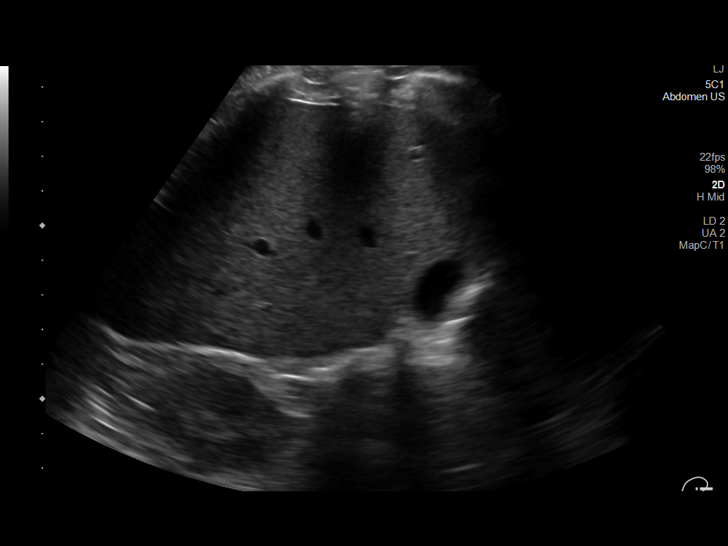
[im 10/59]
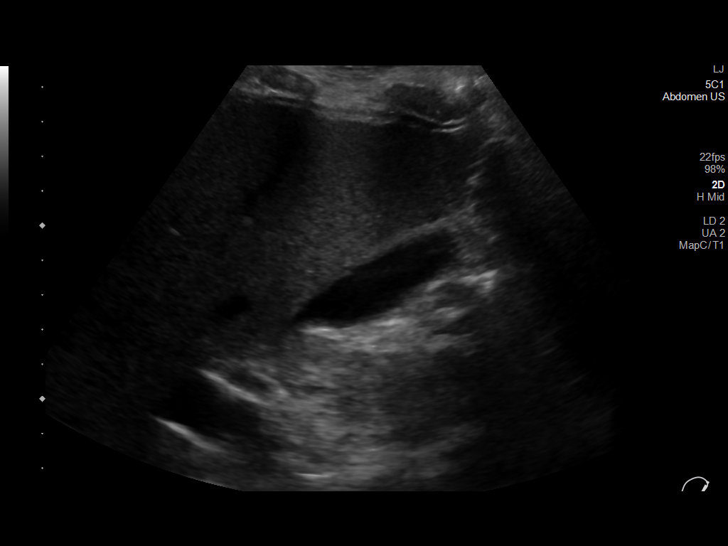
[im 15/59]
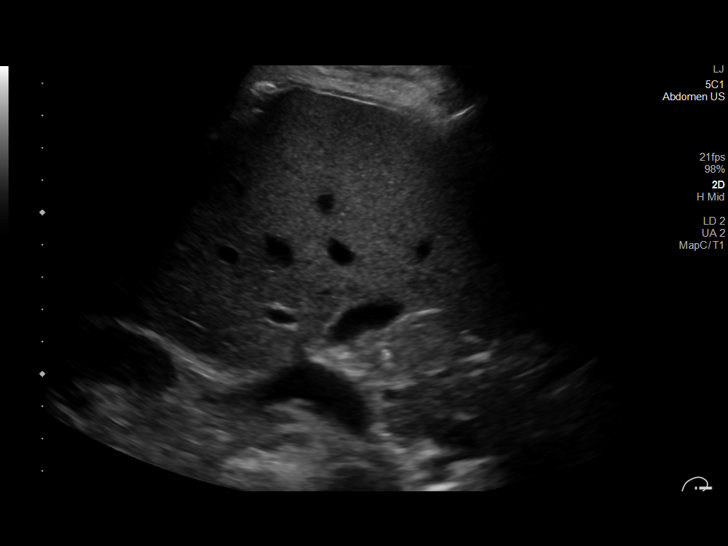
[im 20/59]
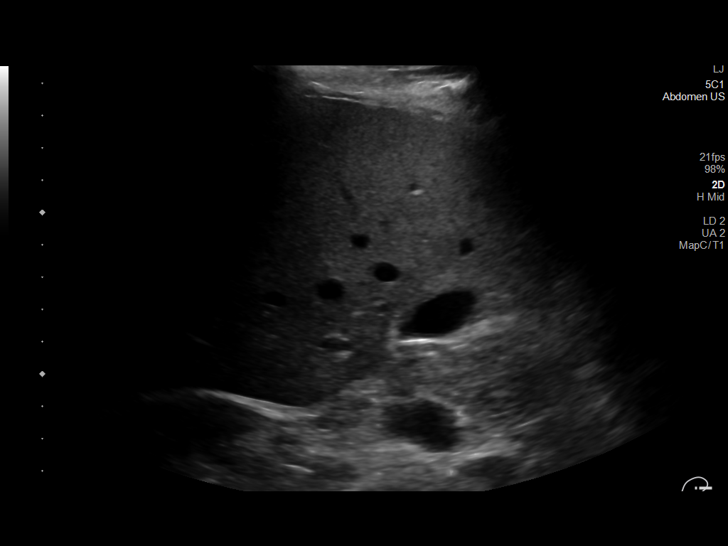
[im 25/59]
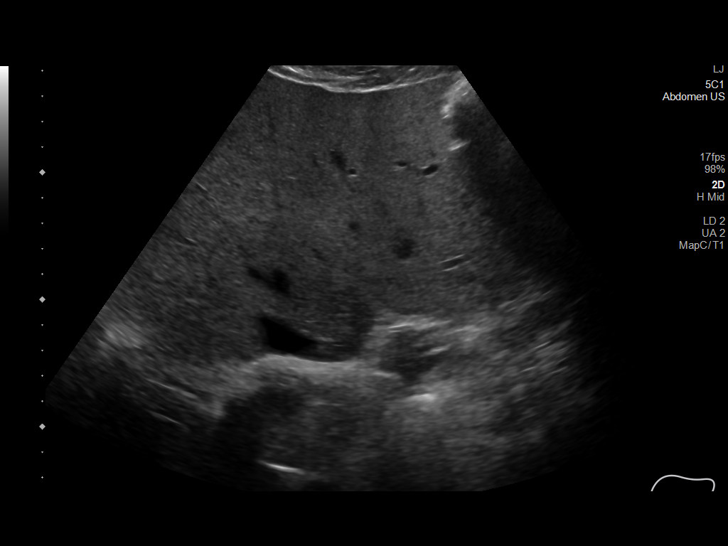
[im 30/59]
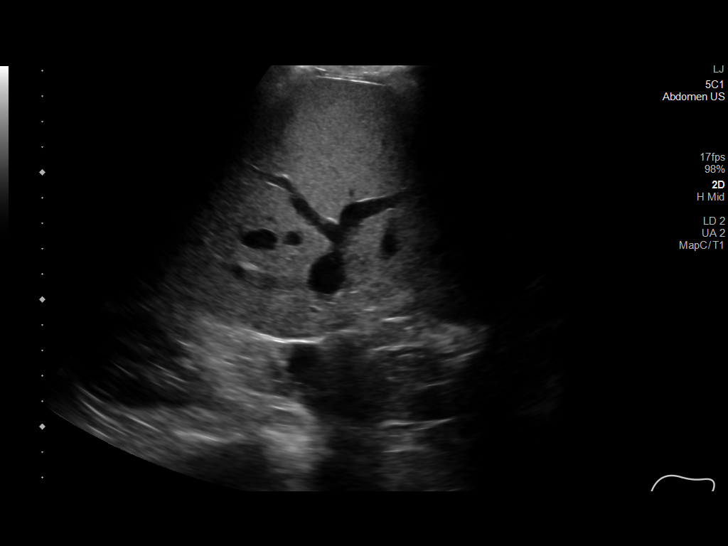
[im 34/59]
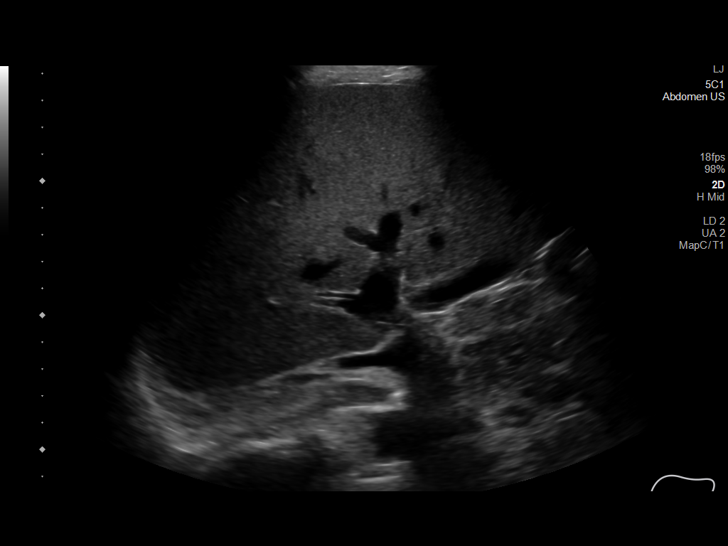
[im 39/59]
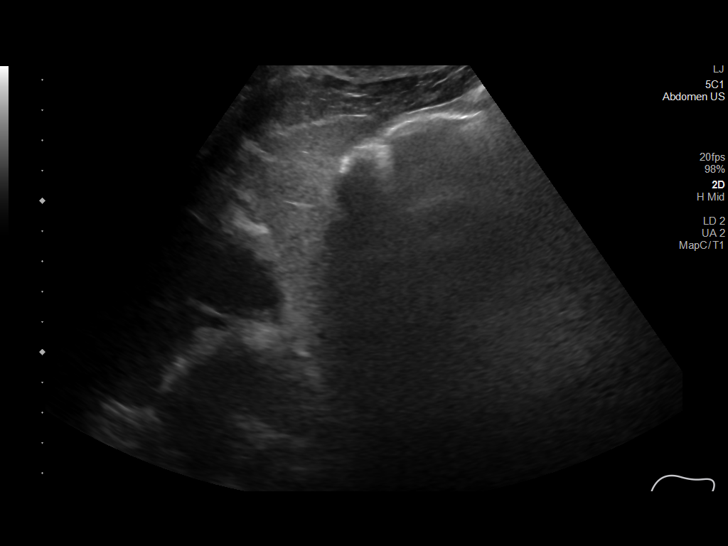
[im 44/59]
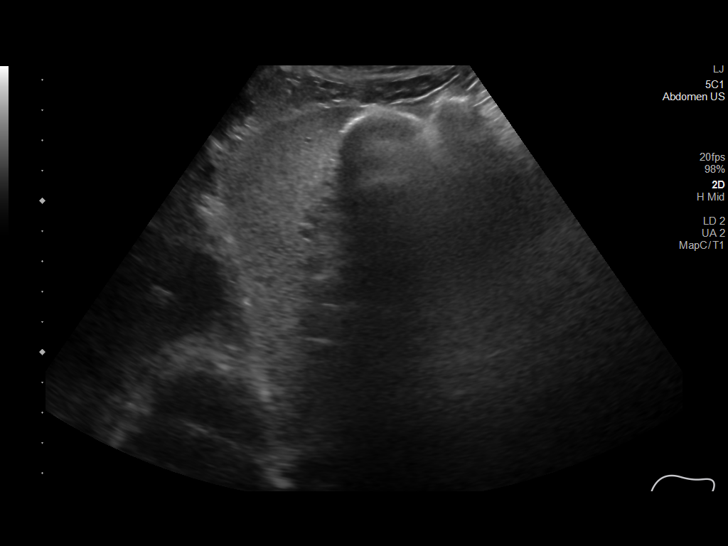
[im 49/59]
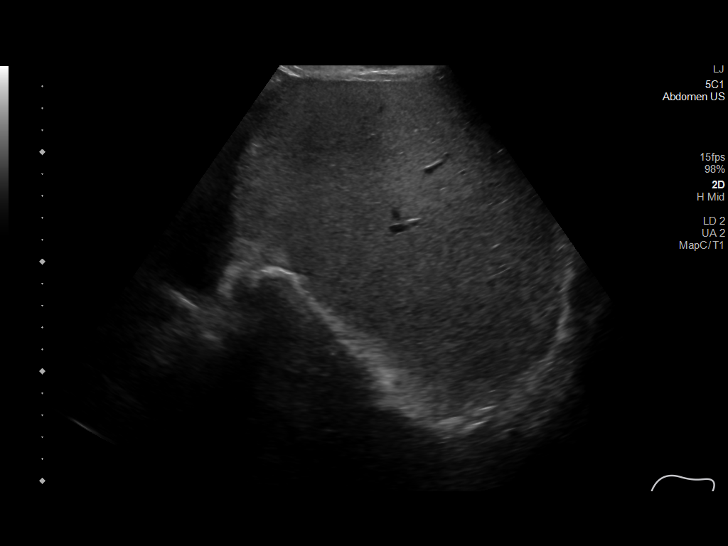
[im 54/59]
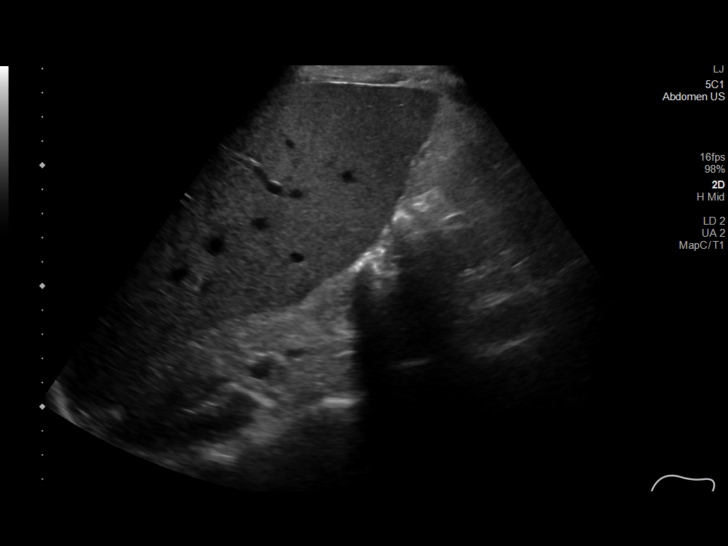
[im 59/59]
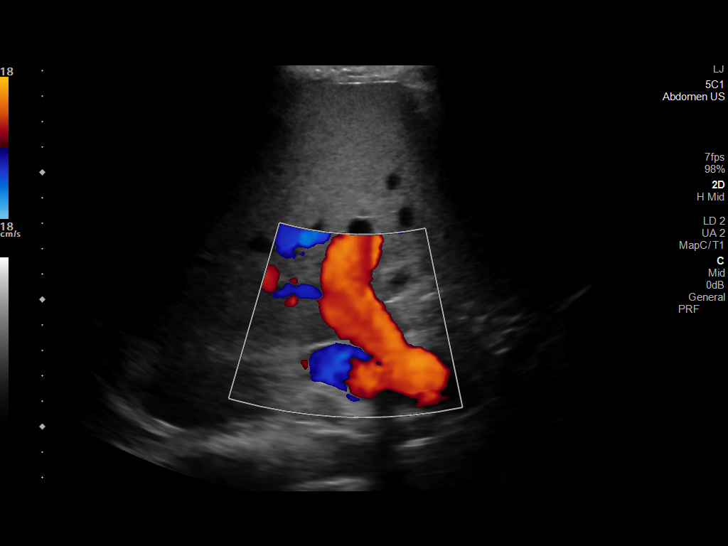

[13 of 25 positions shown; findings below may reference images not displayed]

FINDINGS: Gallbladder:

No gallstones or wall thickening visualized. No sonographic Murphy
sign noted by sonographer.

Common bile duct:

Diameter: 3 mm

Liver:

Area of increased parenchymal echogenicity in the left lobe spanning
approximately 3 x 1.4 x 2.8 cm. This may correspond to a
hypoattenuating area along the inferior aspect of segment 4 B on the
prior CT suggesting focal fat. Overall echogenicity of the liver is
mildly increased. No other liver lesions. No defined masses. Portal
vein is patent on color Doppler imaging with normal direction of
blood flow towards the liver.

Other: None.
IMPRESSION: 1. No acute findings.  Normal gallbladder.  No bile duct dilation.
2. Area of hyperechogenicity in the left lobe of the liver, which
may reflect a focal area of fat accumulation or a poorly defined
meningioma, but is nonspecific. It may correspond to a
hypoattenuating area on the prior CT. This was not evident on the
previous ultrasound. This finding could be further
assessed/characterized with liver MRI without and with contrast.
3. Mild generalized increased liver parenchymal echogenicity
consistent with hepatic steatosis, less prominent than on the prior
ultrasound.

## 2021-03-16 MED ORDER — LORAZEPAM 2 MG/ML IJ SOLN
1.0000 mg | INTRAMUSCULAR | Status: DC | PRN
  Administered 2021-03-16 – 2021-03-17 (×2): 2 mg via INTRAVENOUS
  Filled 2021-03-16 (×3): qty 1

## 2021-03-16 MED ORDER — LORAZEPAM 1 MG PO TABS
1.0000 mg | ORAL_TABLET | ORAL | Status: DC | PRN
  Administered 2021-03-16 (×2): 2 mg via ORAL
  Filled 2021-03-16: qty 2

## 2021-03-16 MED ORDER — ELVITEG-COBIC-EMTRICIT-TENOFAF 150-150-200-10 MG PO TABS
1.0000 | ORAL_TABLET | Freq: Every day | ORAL | Status: DC
  Administered 2021-03-17 – 2021-03-19 (×3): 1 via ORAL
  Filled 2021-03-16 (×3): qty 1

## 2021-03-16 NOTE — Progress Notes (Signed)
Subjective: HD #5 Overnight, no acute events reported. Required 34m Ativan overnight for CIWA 12.   Mr. LRaulstonstates that he is feeling much better today. His tremors have improved and he has a strong appetite. He has been able to walk around without trouble. He denies any abdominal pain. He does note persistent L-sided chest pain which he attributes to anxiety. He is interested in rehab following discharge for his alcohol use and has been calling a few rehab centers.   Objective:  Vital signs in last 24 hours: Vitals:   03/15/21 1602 03/15/21 1900 03/15/21 2339 03/16/21 0339  BP: (!) 96/54 105/66 98/61   Pulse: (!) 52 (!) 52 60 (!) 57  Resp: _0 Temp: 98.4 F (36.9 C) 98.7 F (37.1 C) 98.3 F (36.8 C) 98 F (36.7 C)  TempSrc: Oral Oral Oral Oral  SpO2: 97% 100% 100% 100%  Weight:      Height:       CBC Latest Ref Rng & Units 03/15/2021 03/14/2021 03/13/2021  WBC 4.0 - 10.5 K/uL 3.8(L) 3.0(L) 2.3(L)  Hemoglobin 13.0 - 17.0 g/dL 12.9(L) 13.4 13.0  Hematocrit 39.0 - 52.0 % 38.4(L) 39.4 38.1(L)  Platelets 150 - 400 K/uL 146(L) 127(L) 100(L)   CMP Latest Ref Rng & Units 03/16/2021 03/15/2021 03/14/2021  Glucose 70 - 99 mg/dL 95 116(H) 112(H)  BUN 6 - 20 mg/dL <5(L) 5(L) 6  Creatinine 0.61 - 1.24 mg/dL 0.70 0.60(L) 0.60(L)  Sodium 135 - 145 mmol/L 136 136 137  Potassium 3.5 - 5.1 mmol/L 4.2 3.4(L) 3.8  Chloride 98 - 111 mmol/L 104 107 106  CO2 22 - 32 mmol/L _1 Calcium 8.9 - 10.3 mg/dL 9.2 8.8(L) 9.1  Total Protein 6.5 - 8.1 g/dL 6.0(L) 5.9(L) 5.7(L)  Total Bilirubin 0.3 - 1.2 mg/dL 0.5 0.6 0.6  Alkaline Phos 38 - 126 U/L 30(L) 33(L) 33(L)  AST 15 - 41 U/L 74(H) 71(H) 43(H)  ALT 0 - 44 U/L 78(H) 55(H) 40   Physical Exam  Constitutional: Thin male, no acute distress  HENT: Normocephalic and atraumatic, EOMI, anicteric sclerae; moist mucous membranes Cardiovascular: Normal rate, regular rhythm, S1 and S2 present, no murmurs, rubs, gallops.  Distal pulses  intact Respiratory: No respiratory distress,  Lungs are clear to auscultation bilaterally. On Room Air  Neurological: Is alert and oriented x4, no apparent focal deficits noted. Psychiatric: Flat affect. Behavior is normal. Judgment and thought content normal.   Assessment/Plan:  Active Problems:   Alcohol withdrawal (HCC)   Alcohol withdrawal seizure without complication ([facility]  Mr TChilton Salladeis a 35year old with PMHx of HIV (CD4 count 396, viral load 790), polysubstance use, alcohol use disorder with withdrawal seizures, major depression disorder with SI/HI admitted for alcohol withdrawal.   Extended alcohol withdrawal:  Patient CIWA scores have improved overall (mostly 2-5). However, did have CIWA up to 12 overnight for which he received total of 316mAtivan with improvement.  This morning, patient is doing well.  Tremors have significantly improved.  He is currently on Librium 25 mg 3 times daily.  -Continue Librium 25 mg 3 times daily, will decrease to twice daily dosing tomorrow as long as CIWA scores remain stable -Continue folate, multivitamin, thiamine -AUD rehab desired, patient is continuing to look for rehab places -We will decrease fluids at this time as patient has good oral intake -Checking Phos in the morning for signs of refeeding syndrome as patient has had improved oral intake  Suspected liver cirrhosis: Thrombocytopenia: Patient with thrombocytopenia in setting of chronic alcohol use which is improving.  Platelets 146 today.  No signs of bleeding at this time.  However, patient does have worsening transaminitis since admission.  With most recent AST/ALT of 74/78.  Alk phos slightly borderline low at 30.  Last right upper quadrant ultrasound in October 2021 with large, fatty liver.  Suspect patient may have a component of liver cirrhosis secondary to ongoing alcohol use versus hepatic steatosis.  Hepatitis labs from this admission negative. -Repeat right upper quadrant  ultrasound -Trend CMP and CBC  HIV (CD4 396, VL 790) Continue Genvoya.  Patient to follow-up with infectious disease on discharge  Diet: Regular IVF: N/A DVT Prophylaxis: Lovenox Code status: Full   Prior to Admission Living Arrangement: Home Anticipated Discharge Location: Extended Rehab facility Barriers to Discharge: Continued medical management  Dispo: Anticipated discharge in approximately 2-3 day(s).   Harvie Heck, MD  [facility] PGY-2 03/16/2021, 6:53 AM Pager: [phone] After 5pm on weekdays and 1pm on weekends: On Call pager [phone]

## 2021-03-17 ENCOUNTER — Other Ambulatory Visit: Payer: Self-pay

## 2021-03-17 ENCOUNTER — Other Ambulatory Visit (HOSPITAL_COMMUNITY): Payer: Self-pay

## 2021-03-17 ENCOUNTER — Inpatient Hospital Stay (HOSPITAL_COMMUNITY): Payer: Self-pay

## 2021-03-17 LAB — COMPREHENSIVE METABOLIC PANEL
ALT: 85 U/L — ABNORMAL HIGH (ref 0–44)
AST: 63 U/L — ABNORMAL HIGH (ref 15–41)
Albumin: 3.4 g/dL — ABNORMAL LOW (ref 3.5–5.0)
Alkaline Phosphatase: 30 U/L — ABNORMAL LOW (ref 38–126)
Anion gap: 6 (ref 5–15)
BUN: 7 mg/dL (ref 6–20)
CO2: 27 mmol/L (ref 22–32)
Calcium: 9.1 mg/dL (ref 8.9–10.3)
Chloride: 102 mmol/L (ref 98–111)
Creatinine, Ser: 0.82 mg/dL (ref 0.61–1.24)
GFR, Estimated: >60 mL/min (ref >60)
Glucose, Bld: 131 mg/dL — ABNORMAL HIGH (ref 70–99)
Potassium: 4 mmol/L (ref 3.5–5.1)
Sodium: 135 mmol/L (ref 135–145)
Total Bilirubin: 0.3 mg/dL (ref 0.3–1.2)
Total Protein: 6 g/dL — ABNORMAL LOW (ref 6.5–8.1)

## 2021-03-17 LAB — CBC
HCT: 39.7 % (ref 39.0–52.0)
Hemoglobin: 13.3 g/dL (ref 13.0–17.0)
MCH: 33.9 pg (ref 26.0–34.0)
MCHC: 33.5 g/dL (ref 30.0–36.0)
MCV: 101.3 fL — ABNORMAL HIGH (ref 80.0–100.0)
Platelets: 183 10*3/uL (ref 150–400)
RBC: 3.92 MIL/uL — ABNORMAL LOW (ref 4.22–5.81)
RDW: 13.9 % (ref 11.5–15.5)
WBC: 3.1 10*3/uL — ABNORMAL LOW (ref 4.0–10.5)
nRBC: 0 % (ref 0.0–0.2)

## 2021-03-17 LAB — PHOSPHORUS: Phosphorus: 4.6 mg/dL (ref 2.5–4.6)

## 2021-03-17 IMAGING — MR MR ABDOMEN WO/W CM
10 of 18 series · 23 of 48 positions shown · IV contrast (gadavist)
Comparison: Ultrasound [DATE].  CT [DATE].

CLINICAL DATA: Ultrasound demonstrating a hyperechoic left hepatic
lobe lesion. Thrombocytopenia in the setting of chronic alcohol use.

EXAM:
MRI ABDOMEN WITHOUT AND WITH CONTRAST
TECHNIQUE: Multiplanar multisequence MR imaging of the abdomen was performed
both before and after the administration of intravenous contrast.
CONTRAST:  7.5mL GADAVIST GADOBUTROL 1 MMOL/ML IV SOLN

[Series 3: cor ssfse nav · coronal · 6.0mm · 0.78mm/px · 2 of 34 slices shown]
[im 1/34]
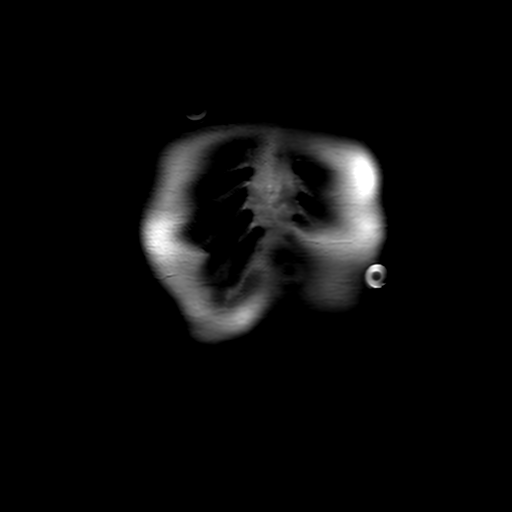
[im 34/34]
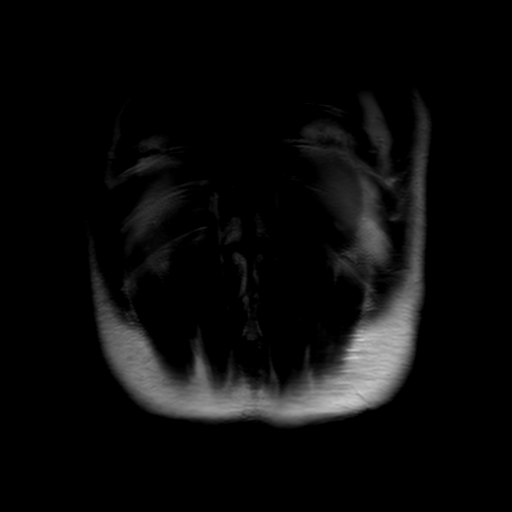

[Series 4: ax ssfse nav · axial · 6.0mm · 0.74mm/px · z∈[-24,+173]mm · 2 of 34 slices shown]
[im 1/34]
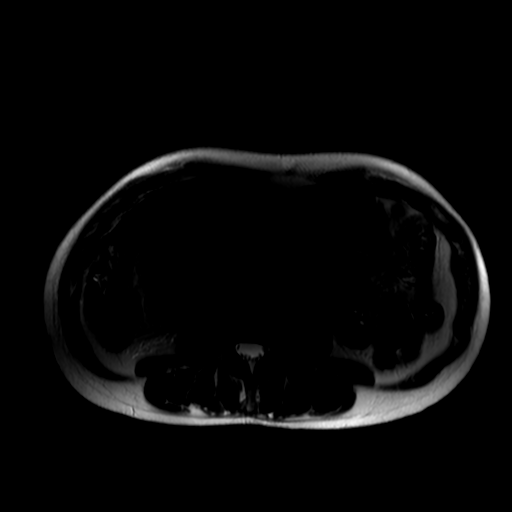
[im 34/34]
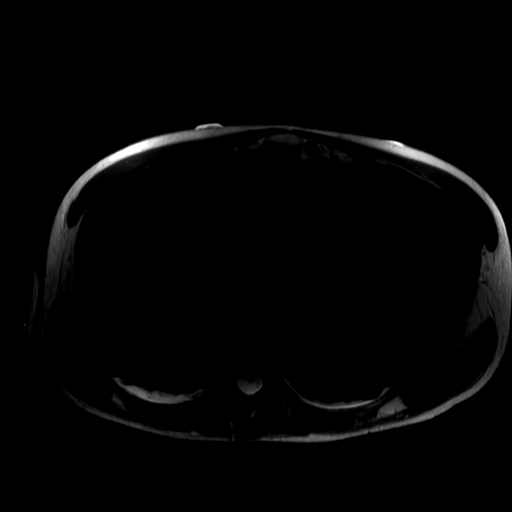

[Series 5: T2 fat-sat · axial · 6.0mm · 0.74mm/px · z∈[-24,+173]mm · 2 of 34 slices shown]
[im 1/34]
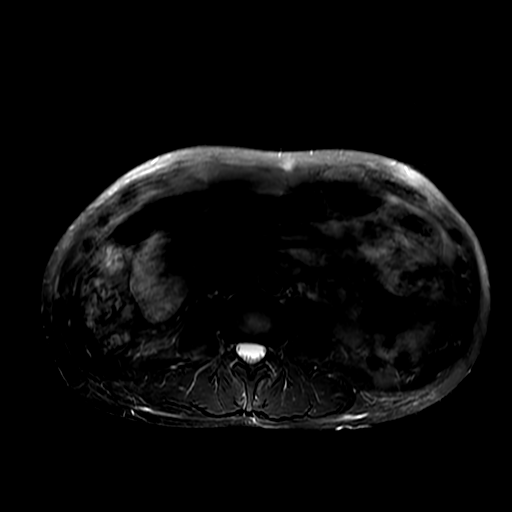
[im 34/34]
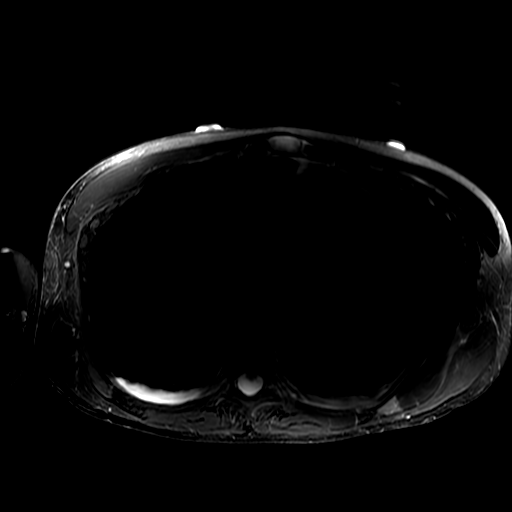

[Series 6: DWI b500 · axial · 8.0mm · 1.76mm/px · z∈[-58,+201]mm · 2 of 54 slices shown]
[im 1/54]
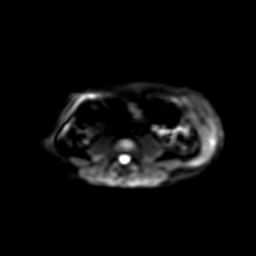
[im 54/54]
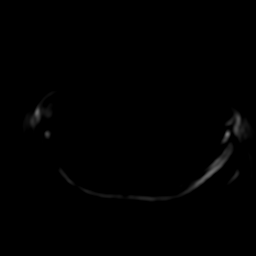

[Series 10: T1 dynamic · coronal · 3.4mm · 1.56mm/px · 3 of 120 slices shown]
[im 1/120]
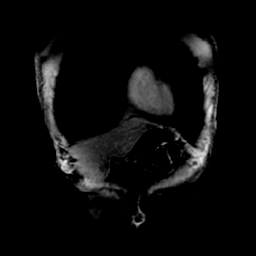
[im 60/120]
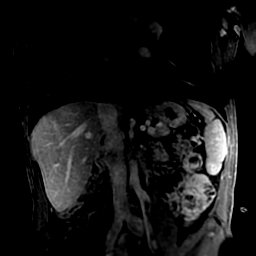
[im 120/120]
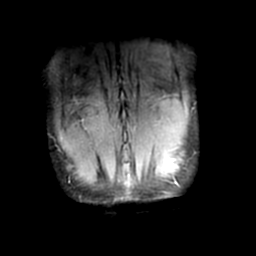

[Series 650: ADC · axial · 8.0mm · 1.76mm/px · 1 of 27 slices shown]
[im 1/27]
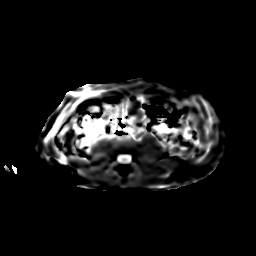

[Series 900: T1 dynamic post-contrast · axial · non-contrast · 4.0mm · 0.86mm/px · z∈[-91,+166]mm · 3 of 130 slices shown (1 of 4)]
[im 1/130]
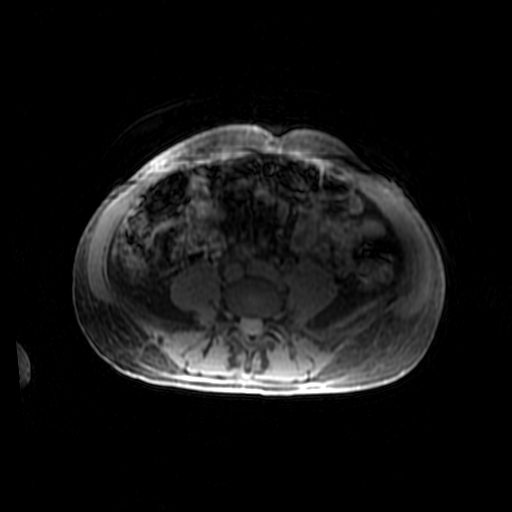
[im 65/130]
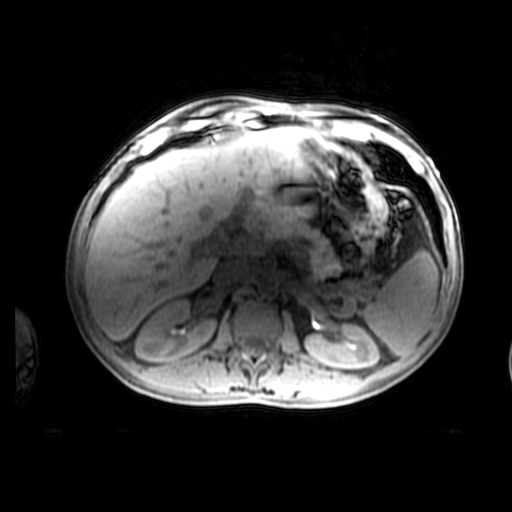
[im 130/130]
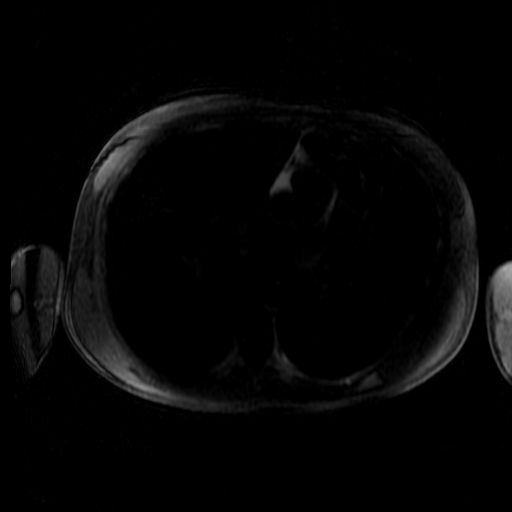

[Series 901: T1 dynamic post-contrast · axial · non-contrast · 4.0mm · 0.86mm/px · z∈[-91,+166]mm · 3 of 130 slices shown (2 of 4)]
[im 1/130]
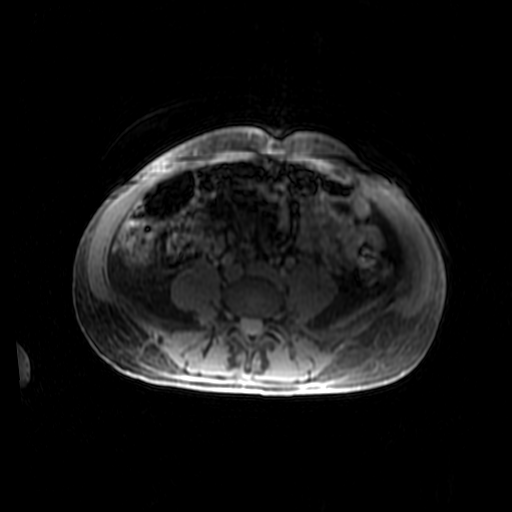
[im 65/130]
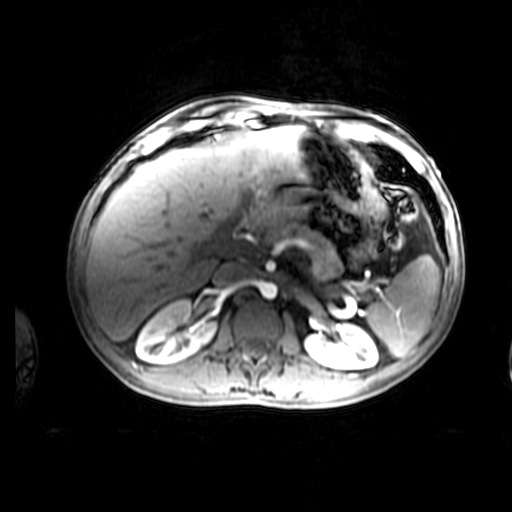
[im 130/130]
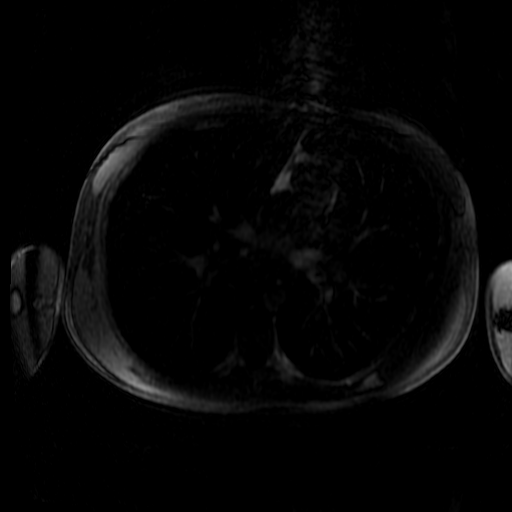

[Series 902: T1 dynamic post-contrast · axial · non-contrast · 4.0mm · 0.86mm/px · z∈[-91,+166]mm · 3 of 130 slices shown (3 of 4)]
[im 1/130]
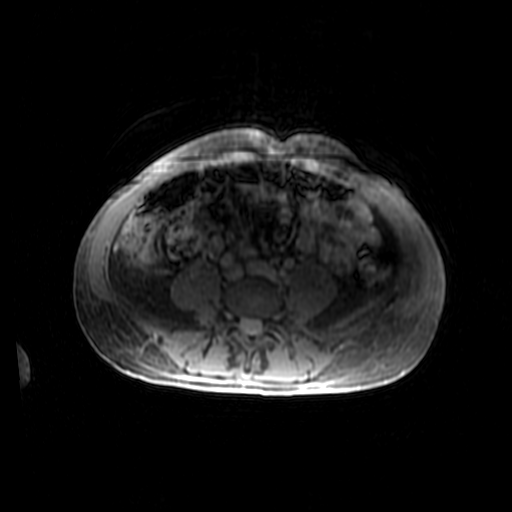
[im 65/130]
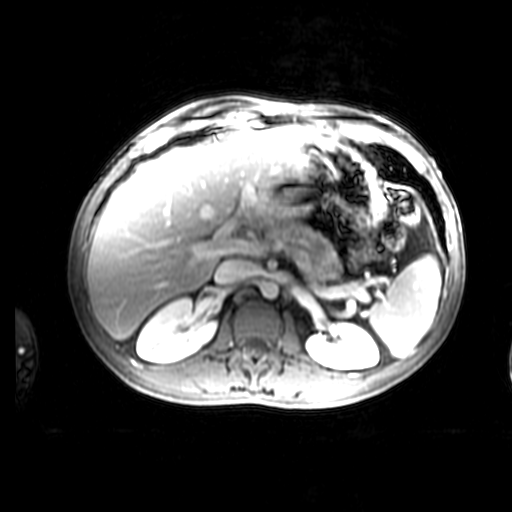
[im 130/130]
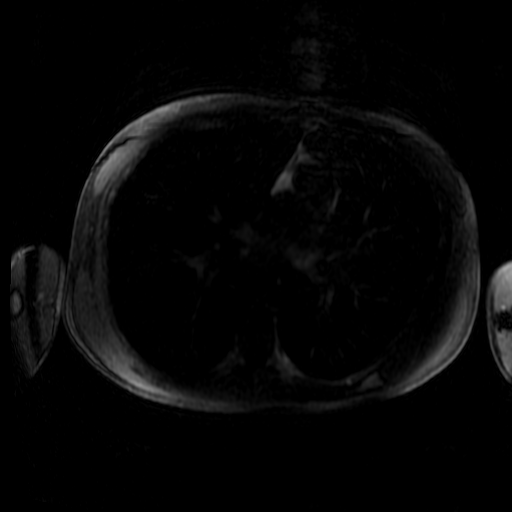

[Series 903: T1 dynamic post-contrast · axial · non-contrast · 4.0mm · 0.86mm/px · z∈[-91,+37]mm · 2 of 130 slices shown (4 of 4)]
[im 1/130]
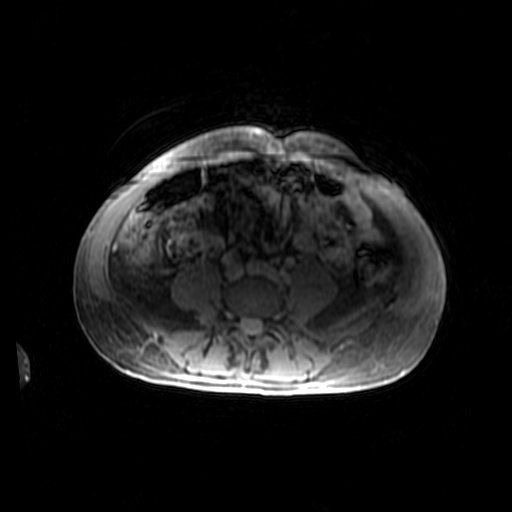
[im 65/130]
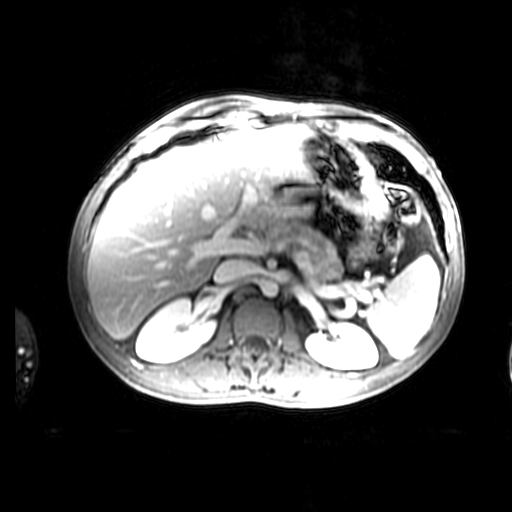

[23 of 48 positions shown; findings below may reference images not displayed]

FINDINGS: Lower chest: Trace right pleural fluid.  Normal heart size.

Hepatobiliary: No evidence of cirrhosis. No suspicious liver lesion
or correlate for the ultrasound abnormality. Normal gallbladder,
without biliary ductal dilatation.

Pancreas:  Normal, without mass or ductal dilatation.

Spleen: The spleen measures 14.2 by 7.0 x 5.6 cm (volume = 290
cm^3).

Adrenals/Urinary Tract: Normal adrenal glands. Lower pole right
renal subcentimeter cyst. Normal left kidney.

Stomach/Bowel: Normal stomach and abdominal bowel loops.

Vascular/Lymphatic: Normal caliber of the aorta and branch vessels.
Patent portal, hepatic, splenic veins. No abdominal adenopathy.

Other:  No ascites.

Musculoskeletal: No acute osseous abnormality.
IMPRESSION: 1. No suspicious liver lesion or correlate for the ultrasound
abnormality, which was presumably artifactual or related to MRI
occult focal steatosis.
2. No cirrhosis or explanation for thrombocytopenia.

## 2021-03-17 MED ORDER — LACTATED RINGERS IV BOLUS
500.0000 mL | Freq: Once | INTRAVENOUS | Status: AC
  Administered 2021-03-17: 500 mL via INTRAVENOUS

## 2021-03-17 MED ORDER — CHLORDIAZEPOXIDE HCL 25 MG PO CAPS
25.0000 mg | ORAL_CAPSULE | Freq: Two times a day (BID) | ORAL | Status: DC
  Administered 2021-03-17: 25 mg via ORAL
  Filled 2021-03-17: qty 1

## 2021-03-17 MED ORDER — GADOBUTROL 1 MMOL/ML IV SOLN
7.5000 mL | Freq: Once | INTRAVENOUS | Status: AC | PRN
  Administered 2021-03-17: 7.5 mL via INTRAVENOUS

## 2021-03-17 NOTE — Progress Notes (Signed)
Patient ID: Gary [name], male   DOB: Dec 16, 1985, 35 y.o.   MRN: [id number]   Subjective: HD#5  ON: required 4 mg ativan total. 2 mg for CIWA 13 and 2 mg for CIWA 6. Hypotensive overnight, however patient is young and on the smaller side and has had intermittent asymptomatic hypotension throughout admission and is likely physiologic.   Patient reports mild nausea and headache this morning that is improving. He also reports dizziness on standing and persistent chest pain. He says chest pain is lightening up over the last few days and has not changed. He reports good oral intake or food and water.   Spoke about rehab placement for after discharge. He has called some of the facilities on the list given to him but has not found a facility that will be able to take him yet.   Objective:  Vital signs in last 24 hours: Vitals:   03/16/21 1516 03/16/21 1938 03/16/21 2307 03/17/21 0318  BP: (!) 101/57 (!) 103/59 100/64 (!) 89/58  Pulse: (!) 58 (!) 56 (!) 51 78  Resp: 19 18 16 16   Temp: 98.2 F (36.8 C) 98.4 F (36.9 C) 98.6 F (37 C) 97.9 F (36.6 C)  TempSrc: Oral Oral Oral Oral  SpO2: 100% 100% 99% 100%  Weight:      Height:       Weight change:   Intake/Output Summary (Last 24 hours) at 03/17/2021 05/17/2021 Last data filed at 03/16/2021 2317 Gross per 24 hour  Intake 650 ml  Output 1875 ml  Net -1225 ml   Physical exam:  General: thin man, who appears older than his stated age. Sitting comfortably in bed in no acute distress.  CV: normal rate and rhythm, no murmurs rubs or gallops, no lower extremity edema. Brachial pulses equal and strong. Pulm: normal work of breathing, CTA bilaterally GI: soft, non distended. Mild tenderness to palpation in LUQ, otherwise non tender.  Psych: flat affect. Patient appears depressed.   Pertinent labs and studies:  IMPRESSION: 1. No acute findings.  Normal gallbladder.  No bile duct dilation. 2. Area of hyperechogenicity in the left lobe of the  liver, which may reflect a focal area of fat accumulation or a poorly defined meningioma, but is nonspecific. It may correspond to a hypoattenuating area on the prior CT. This was not evident on the previous ultrasound. This finding could be further assessed/characterized with liver MRI without and with contrast. 3. Mild generalized increased liver parenchymal echogenicity consistent with hepatic steatosis, less prominent than on the prior ultrasound.  CMP Latest Ref Rng & Units 03/17/2021 03/16/2021 03/15/2021  Glucose 70 - 99 mg/dL 05/15/2021) 95 124(P)  BUN 6 - 20 mg/dL 7 809(X) 5(L)  Creatinine 0.61 - 1.24 mg/dL <8(P 3.82 5.05)  Sodium 135 - 145 mmol/L 135 136 136  Potassium 3.5 - 5.1 mmol/L 4.0 4.2 3.4(L)  Chloride 98 - 111 mmol/L 102 104 107  CO2 22 - 32 mmol/L 27 27 25   Calcium 8.9 - 10.3 mg/dL 9.1 9.2 3.97(Q)  Total Protein 6.5 - 8.1 g/dL 6.0(L) 6.0(L) 5.9(L)  Total Bilirubin 0.3 - 1.2 mg/dL 0.3 0.5 0.6  Alkaline Phos 38 - 126 U/L 30(L) 30(L) 33(L)  AST 15 - 41 U/L 63(H) 74(H) 71(H)  ALT 0 - 44 U/L 85(H) 78(H) 55(H)   CBC Latest Ref Rng & Units 03/17/2021 03/15/2021 03/14/2021  WBC 4.0 - 10.5 K/uL 3.1(L) 3.8(L) 3.0(L)  Hemoglobin 13.0 - 17.0 g/dL 05/15/2021 12.9(L) 13.4  Hematocrit 39.0 -  52.0 % 39.7 38.4(L) 39.4  Platelets 150 - 400 K/uL 183 146(L) 127(L)   Component Ref Range & Units 07:54  (03/17/21) 5 d ago  (03/12/21) 6 d ago  (03/11/21) 3 mo ago  (12/07/20) 3 mo ago  (12/05/20) 4 mo ago  (10/19/20) 9 mo ago  (06/13/20)  Phosphorus 2.5 - 4.6 mg/dL 4.6  3.5 CM  3.9 CM  3.2 CM  4.1 CM  3.7 CM  4.6 CM    Assessment/Plan:  Active Problems:   Alcohol withdrawal (HCC)   Alcohol withdrawal seizure without complication (HCC)  DAIVON RAYOS is a 35 year old male with a history of HIV, polysubstance use, and alcohol withdrawal seizures admitted for alcohol withdrawal complicated by seizures and alcoholic hallucinosis.   Alcohol withdrawal - complicated by seizures (resolved) and alcoholic  hallucinosis (resolved) Required 4 mg of ativan overnight 2 mg for CIWA 13 and 2 mg for CIWA 6. Librium was decreased to 25 mg TID yesterday. Phos remains normal; no concerns for refeeding syndrome.  - decreased librium to 25 mg BID.  - plan to decrease to 25 mg daily tomorrow as long as CIWA scores and vital remain stable.  - continue thiamine, multivitamin, and folate - continue ativan PRN  - continue CIWA   Transaminitis  LFTs have been slowly up trending since admission. Previously increased intermittently since 2015. Known history of fatty liver disease. [place] yesterday showed area of hyperechogenicity in L lobe of liver consistent with fat accumulation v poorly defined meningioma, new since last RUQ [place] in  October 2021. Suspect related to fatty liver disease changes, but will further evaluate inpatient as patient is high risk due to immunodeficiency and outpatient follow up may be difficult as he is unhoused. - MRI liver w/ and w/o contrast - trend CMP   Chest pain Patient reports continued chest pain unchanged in character, but improving in severity. Cardiac monitoring has been unrevealing. Troponins negative. EKGs no evidence of ischemia. Do not believe chest pain to be cardiac in nature. Likely secondary to anxiety given patient's significant history and self reported persistent anxiety. Patient declines consultation with psychiatry.  - recommend outpatient psychiatric follow up  - continue cardiac monitoring   Hypotension Hypotensive overnight with okay MAPs, however patient is young and on the smaller side and has had intermittent asymptomatic hypotension throughout admission and is likely physiologic. Though patient reports good oral intake recorded input is minimal and he reports orthostatic dizziness. Patient did not appear dry on exam, however dehydration is possible etiology of his dizziness. - monitor vitals - monitor I&Os   Thrombocytopenia - resolved  In setting of chronic  alcohol use and known fatty liver diease. Platelets normal on CBC today, 183.   Dispo:  Patient desires to enter rehab facility after discharge for continued support and treatment for AUD. He has called some of the facilities on the list given to him by social work but has not been successful yet in finding any facility that can take him. Spoke with social worker this morning and she plans to follow up with him this afternoon about rehab placement and also bring him a list of shelters in the area so that he may call some of them as well to ensure he has a place to go at discharge if he cannot find a rehab facility.   HIV (CD4 396, VL 790) Continue Genvoya.  Patient to follow-up with infectious disease on discharge  Diet: Regular IVF: N/A DVT Prophylaxis: Lovenox  Code status: Full   Prior to Admission Living Arrangement: Home Anticipated Discharge Location: Extended Rehab facility Barriers to Discharge: Continued medical management  Dispo: Anticipated discharge in approximately 2-3 day(s).     LOS: 6 days   Laverna Peace, Medical Student 03/17/2021, 6:27 AM

## 2021-03-17 NOTE — Progress Notes (Signed)
CSW met with patient earlier today to check on progress with reaching out to rehab facilities. Patient said he contacted [facility] and they were asking for CSW to reach out about clinical information. CSW called [facility] and left a voicemail for June, awaiting call back. Per receptionist, they thought that they patient was at [facility], and [place] explained that patient is at the medical hospital.  CSW also provided patient with a list of homeless shelters, in case there is no rehab bed found prior to discharge. CSW to follow.  Laveda Abbe, [place] Clinical Social Worker [phone]

## 2021-03-17 NOTE — Plan of Care (Signed)
  Problem: Coping: Goal: Ability to adjust to condition or change in health will improve Outcome: Progressing Goal: Ability to identify appropriate support needs will improve Outcome: Progressing   Problem: Education: Goal: Expressions of having a comfortable level of knowledge regarding the disease process will increase Outcome: Progressing   Problem: Health Behavior/Discharge Planning: Goal: Compliance with prescribed medication regimen will improve Outcome: Progressing   Problem: Medication: Goal: Risk for medication side effects will decrease Outcome: Progressing   Problem: Safety: Goal: Verbalization of understanding the information provided will improve Outcome: Progressing

## 2021-03-18 DIAGNOSIS — R7401 Elevation of levels of liver transaminase levels: Secondary | ICD-10-CM

## 2021-03-18 LAB — COMPREHENSIVE METABOLIC PANEL
ALT: 74 U/L — ABNORMAL HIGH (ref 0–44)
AST: 43 U/L — ABNORMAL HIGH (ref 15–41)
Albumin: 3.5 g/dL (ref 3.5–5.0)
Alkaline Phosphatase: 33 U/L — ABNORMAL LOW (ref 38–126)
Anion gap: 8 (ref 5–15)
BUN: 10 mg/dL (ref 6–20)
CO2: 27 mmol/L (ref 22–32)
Calcium: 9.3 mg/dL (ref 8.9–10.3)
Chloride: 102 mmol/L (ref 98–111)
Creatinine, Ser: 0.69 mg/dL (ref 0.61–1.24)
GFR, Estimated: >60 mL/min (ref >60)
Glucose, Bld: 95 mg/dL (ref 70–99)
Potassium: 3.8 mmol/L (ref 3.5–5.1)
Sodium: 137 mmol/L (ref 135–145)
Total Bilirubin: 0.7 mg/dL (ref 0.3–1.2)
Total Protein: 6.4 g/dL — ABNORMAL LOW (ref 6.5–8.1)

## 2021-03-18 LAB — CBC
HCT: 39.8 % (ref 39.0–52.0)
Hemoglobin: 13.5 g/dL (ref 13.0–17.0)
MCH: 34.2 pg — ABNORMAL HIGH (ref 26.0–34.0)
MCHC: 33.9 g/dL (ref 30.0–36.0)
MCV: 100.8 fL — ABNORMAL HIGH (ref 80.0–100.0)
Platelets: 228 10*3/uL (ref 150–400)
RBC: 3.95 MIL/uL — ABNORMAL LOW (ref 4.22–5.81)
RDW: 13.8 % (ref 11.5–15.5)
WBC: 3.7 10*3/uL — ABNORMAL LOW (ref 4.0–10.5)
nRBC: 0 % (ref 0.0–0.2)

## 2021-03-18 MED ORDER — CHLORDIAZEPOXIDE HCL 25 MG PO CAPS
25.0000 mg | ORAL_CAPSULE | Freq: Once | ORAL | Status: AC
  Administered 2021-03-18: 25 mg via ORAL
  Filled 2021-03-18: qty 1

## 2021-03-18 NOTE — Plan of Care (Signed)
  Problem: Education: Goal: Expressions of having a comfortable level of knowledge regarding the disease process will increase Outcome: Progressing   Problem: Coping: Goal: Ability to adjust to condition or change in health will improve Outcome: Progressing Goal: Ability to identify appropriate support needs will improve Outcome: Progressing   Problem: Health Behavior/Discharge Planning: Goal: Compliance with prescribed medication regimen will improve Outcome: Progressing   Problem: Medication: Goal: Risk for medication side effects will decrease Outcome: Progressing   Problem: Clinical Measurements: Goal: Complications related to the disease process, condition or treatment will be avoided or minimized Outcome: Progressing Goal: Diagnostic test results will improve Outcome: Progressing   Problem: Safety: Goal: Verbalization of understanding the information provided will improve Outcome: Progressing   Problem: Self-Concept: Goal: Level of anxiety will decrease Outcome: Progressing Goal: Ability to verbalize feelings about condition will improve Outcome: Progressing   Problem: Education: Goal: Knowledge of disease or condition will improve Outcome: Progressing Goal: Understanding of discharge needs will improve Outcome: Progressing   Problem: Health Behavior/Discharge Planning: Goal: Ability to identify changes in lifestyle to reduce recurrence of condition will improve Outcome: Progressing Goal: Identification of resources available to assist in meeting health care needs will improve Outcome: Progressing   Problem: Physical Regulation: Goal: Complications related to the disease process, condition or treatment will be avoided or minimized Outcome: Progressing   Problem: Safety: Goal: Ability to remain free from injury will improve Outcome: Progressing

## 2021-03-18 NOTE — Discharge Summary (Addendum)
Name: Gary [name] MRN: [id number] DOB: 09/06/1986 35 y.o. PCP: Pcp, No  Date of Admission: 03/11/2021 10:06 AM Date of Discharge: 03/19/2021 Attending Physician: Gary CatalinaMullen, Gary B, MD  Discharge Diagnosis: 1. 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 Transaminitis  3. HIV 4. Pressure ulcers of feet 5. Anxiety   Discharge Medications: Allergies as of 03/19/2021       Reactions   Tegretol [carbamazepine] Other (See Comments)   Caused vertigo for 2 days after taking it   Caffeine Palpitations        Medication List     STOP taking these medications    chlordiazePOXIDE 25 MG capsule Commonly known as: LIBRIUM       TAKE these medications    elvitegravir-cobicistat-emtricitabine-tenofovir 150-150-200-10 MG Tabs tablet Commonly known as: GENVOYA Take 1 tablet by mouth daily with breakfast.               Discharge Care Instructions  (From admission, onward)           Start     Ordered   03/19/21 0000  No dressing needed        03/19/21 1136            Disposition and follow-up:   Gary [name] [facility] Stable condition.  At the hospital follow up visit please address:  1. 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 Will follow up with alcohol rehab facility on discharge. Please continue to encourage alcohol cessation. 2. Transaminitis- likely 2/2 alcohol use, improving on discharge; f/u CMP 3. HIV - Continue Genvoya and follow up in ID office 4. Anxiety - readdress and offer CBT vs pharmacologic therapy    Labs / imaging needed at time of follow-up: CMP   Pending labs/ test needing follow-up: none  Follow-up Appointments:  Follow-up Information     Gary EvesVan [name], Gary Grinderornelius N, MD. Go on 03/20/2021.   Specialty: Infectious Diseases Why: Please go to your  infectious diease follow up appointment on 03/20/2021 at 330 pm. Please arrive 15 min early by 315 pm.  Contact information: [address]  [place] [address] [phone]         Services, [facility]. Call.   Why: Contact [facility] after you've been consistent on your medications for another week after discharge, they will reassess at that point for a possible admission. Contact information: Gary Jenkins[address] [place] [address] [phone]                 Hospital Course by problem list: Gary [name] is a 35 year old male with a history of polysubstance use, alcohol use disorder with prior withdrawal seizures, subdural hematoma, SI and HI admitted for alcohol withdrawal.   1. Prolonged alcohol withdrawal - complicated by seizures and alcoholic hallucinosis  [name] presented to the ED on 03/11/2021 due to witnessed seizure secondary to decreased alcohol intake. In the ED EtOH level was noted to be 396 and he was given 3 mg of ativan and 25 mg of libirum. CT head was done given witnessed seizure with concern for head trauma, but was unremarkable.  On admission he was initially started on librium taper 25 mg Q6H, but required a significant amount of additional ativan on top of this so librium was increased to 50 mg Q6H on 05/05 This was then tapered to 25  mg Q6H on 05/06, 25 mg TID on 05/07, then 25 mg BID on 05/09, finally 25 mg were given in a single dose on 05/10. He was then discharge on 05/11 with plans to stay at his mother's house in [place] until he can stay sober one more week when he will have placement at [facility].   2. Transaminitis  LFTs normal on admission, but slowly increased during course of admission. Previously increased intermittently since 2015. Known history of fatty liver disease. [place] 05/08 showed area of hyperechogenicity in L lobe of liver consistent with fat accumulation v poorly defined meningioma, new since last RUQ [place] in   October 2021. MRI on 05/09 showed no cirrhosis or suspicious liver lesion. US findings presumed to be artifact or related MR occult focal steatosis.   3. Chest pain Patient was bradycardic and borderline hypotensive throughout admission and complained of intermittent chest pain. He reported that his usually gets chest pain whenever he goes through alcohol withdrawal and also with his anxiety. Troponins were negative and EKGs unchanged from prior hospitalizations without any evidence of ischemia or arrhythmia. Hypotension and bradycardia were thought to be due to high doses of benzodiazepines given for his withdrawal. However, as mediations were weaned bradycardia and hypotension persisted. Orthostatic vitals were done and heart rate responded appropriately and BP remained stable in 90s/60s. Echo was completed which was normal except for enlarged atria likely secondary to alcohol use, which may resolve with cessation of alcohol. Chest pain thought to be related to anxiety, with low baseline bp and HR.   4. HIV Patient previously on Genvoya, but has not been taking recently due to financial constraints. Previously followed with Dr. Lorenso [name] in 2020 and Gary [name] in 2021. Seen by ID while in hospital and set up with outpatient follow up and Genvoya reinitiated.  CD4 count 396; viral load 790.  5. Foot ulcers Patient had purplish discoloration over his right 1st and 5th digits on the plantar surface with ulceration over the 5th digit. This is believed to be secondary to wearing damp boots for quite some time.Xray of bilateral feet showed no evidence of osteomyelitis or abscess. Seen by wound care who recommended to apply iodine to wounds and areas of discoloration; then allow to air dry. On day of admission foot ulcers were healed over and not causing any discomfort.   6. Anxiety  Throughout admission patient reported anxiety and endorsed history of anxiety as the reason he drinks alcohol. Given his  polysubstance use benzos are not a good choice for him. His persistent sinus bradycardia and hypotension make a beta blocker a poor choice as well. He would qualify for SSRI treatment especially given comorbid depression. However, patient declines treatment with SSRIs.   Discharge Exam:   BP (!) 90/51 (BP Location: Left Arm)   Pulse 66   Temp 98.2 F (36.8 C) (Oral)   Resp 16   Ht 6\' 1"  (1.854 m)   Wt 72.1 kg   SpO2 99%   BMI 20.97 kg/m  Discharge exam:   General: thin male sitting in bed in no acute distress CV: RRR, no murmurs, rubs or gallops Pulm: CTA bilaterally, normal work of breathing GI: nontender, nondistended, normal bowel sounds Psych: flat affect  Neuro: alert, no focal deficits   Pertinent Labs, Studies, and Procedures:  CBC Latest Ref Rng & Units 03/19/2021 03/18/2021 03/17/2021  WBC 4.0 - 10.5 K/uL 4.2 3.7(L) 3.1(L)  Hemoglobin 13.0 - 17.0 g/dL 05/17/2021 63.1 49.7  Hematocrit 39.0 -  52.0 % 42.2 39.8 39.7  Platelets 150 - 400 K/uL 247 228 183   CMP Latest Ref Rng & Units 03/19/2021 03/18/2021 03/17/2021  Glucose 70 - 99 mg/dL 95 95 732(K)  BUN 6 - 20 mg/dL 9 10 7   Creatinine 0.61 - 1.24 mg/dL 0.25 4.27  Sodium 135 - 145 mmol/L 136 137 135  Potassium 3.5 - 5.1 mmol/L 3.8 3.8 4.0  Chloride 98 - 111 mmol/L 103 102 102  CO2 22 - 32 mmol/L 25 27 27   Calcium 8.9 - 10.3 mg/dL 9.4 9.3 9.1  Total Protein 6.5 - 8.1 g/dL 6.4(L) 6.4(L) 6.0(L)  Total Bilirubin 0.3 - 1.2 mg/dL 0.3 0.7 0.3  Alkaline Phos 38 - 126 U/L 31(L) 33(L) 30(L)  AST 15 - 41 U/L 35 43(H) 63(H)  ALT 0 - 44 U/L 68(H) 74(H) 85(H)   Component Ref Range & Units 6 d ago 1 yr ago  CD4 T Cell Abs 400 - 1,790 /uL 396 Low   320 Low    CD4 % Helper T Cell 33 - 65 % 49  29 Low  CM      Component Ref Range & Units 6 d ago  (03/12/21) 3 mo ago  (12/05/20) 1 yr ago  (02/13/20) 1 yr ago  (09/19/19) 2 yr ago  (06/15/18)  HIV 1 RNA Quant copies/mL 790 VC  20 VC, CM  <20 NOT DETECTED R  17,900 High   96,400 VC, CM    Comment: (NOTE)  The reportable range for this assay is 20 to 10,000,000  copies HIV-1 RNA/mL.   LOG10 HIV-1 RNA log10copy/mL 2.898  1.301 CM    4.984 CM    Component Ref Range & Units 10 d ago  (03/08/21) 11 d ago  (03/07/21) 12 d ago  (03/06/21) 2 wk ago  (02/28/21) 3 wk ago  (02/25/21) 3 wk ago  (02/24/21) 3 wk ago  (02/21/21)  Alcohol, Ethyl (B) <10 mg/dL 02/26/21 High   02/23/21 CM  376 High Panic  CM  358 High Panic  CM  286 High  CM  253 High  CM  405 High Panic     Component Ref Range & Units 6 d ago  (03/13/21) 5 mo ago  (10/19/20) 5 mo ago  (10/19/20) 5 mo ago  (10/04/20) 5 mo ago  (10/04/20) 10 mo ago  (05/06/20) 1 yr ago  (07/25/19)  Troponin I (High Sensitivity) <18 ng/L 3  44 High  CM  54 High  CM  18 High  CM  39 High  CM  8 CM  14 CM      RUQ 05/08/20 IMPRESSION: 1. No acute findings.  Normal gallbladder.  No bile duct dilation. 2. Area of hyperechogenicity in the left lobe of the liver, which may reflect a focal area of fat accumulation or a poorly defined meningioma, but is nonspecific. It may correspond to a hypoattenuating area on the prior CT. This was not evident on the previous ultrasound. This finding could be further assessed/characterized with liver MRI without and with contrast. 3. Mild generalized increased liver parenchymal echogenicity consistent with hepatic steatosis, less prominent than on the prior ultrasound.   LIVER MRI w/ and w/o contrast IMPRESSION: 1. No suspicious liver lesion or correlate for the ultrasound abnormality, which was presumably artifactual or related to MRI occult focal steatosis. 2. No cirrhosis or explanation for thrombocytopenia.  ECHO IMPRESSIONS   1. Left ventricular ejection fraction, by estimation, is 55 to 60%. The  left  ventricle has normal function. The left ventricle has no regional  wall motion abnormalities. Left ventricular diastolic parameters were  normal.   2. Right ventricular systolic function is normal. The right  ventricular  size is normal. There is normal pulmonary artery systolic pressure. The  estimated right ventricular systolic pressure is 26.6 mmHg.   3. Left atrial size was mild to moderately dilated.   4. Right atrial size was mildly dilated.   5. The mitral valve is normal in structure. Trivial mitral valve  regurgitation. No evidence of mitral stenosis.   6. The aortic valve is normal in structure. Aortic valve regurgitation is  not visualized. No aortic stenosis is present.   7. The inferior vena cava is normal in size with greater than 50%  respiratory variability, suggesting right atrial pressure of 3 mmHg.   8. Normal appearing study except for enlarged atria  Discharge Instructions: Discharge Instructions     Call MD for:  difficulty breathing, headache or visual disturbances   Complete by: As directed    Call MD for:  extreme fatigue   Complete by: As directed    Call MD for:  persistant dizziness or light-headedness   Complete by: As directed    Call MD for:  persistant nausea and vomiting   Complete by: As directed    Call MD for:  severe uncontrolled pain   Complete by: As directed    Call MD for:  temperature >100.4   Complete by: As directed    Diet - low sodium heart healthy   Complete by: As directed    Discharge instructions   Complete by: As directed    Gary [name], Gary [name] were admitted to the hospital for alcohol withdrawal and were treated with librium and Ativan during your stay. We encourage you to continue to refrain from alcohol use on discharge.  For your HIV, please attend your follow up appointment with infectious disease on 03/20/2021 at 330 pm. Please arrive 15 min early by 315 pm.  Thank you!   Increase activity slowly   Complete by: As directed    No dressing needed   Complete by: As directed    No wound care   Complete by: As directed        Signed: Eliezer Bottom, MD  [facility] PGY-2 03/19/2021, 11:36 AM   Pager: [phone]

## 2021-03-18 NOTE — Progress Notes (Addendum)
Patient ID: Gary [name], male   DOB: 22-Dec-1985, 35 y.o.   MRN: [id number]   Subjective: HD#7   ON: CIWA score of 2. Received 2 mg of Ativan. BP and HR persistently low. MAP good.   Patient reports that he is still feeling some symptoms of withdrawal, namely chest pain. He denies tachycardia. He tells me that his friend passed away from an overdose and that he is depressed and wanting to drink because of this. He denies SI. He is agreeable to stayign for further cardiac workup though. He states that he has found a place to stay with his mom up in [place]. She is a former alcoholic and regularly attends AA meetings. He states he plans to go to an AA meeting with his mom once he is discharged. He says that many of his friends still drink heavily, but that he has other "special friends" that do not drink.  Objective:  Vital signs in last 24 hours: Vitals:   03/17/21 2320 03/18/21 0309 03/18/21 0809 03/18/21 1125  BP: (!) 92/58 90/62 (!) 91/54 99/67  Pulse: (!) 51 (!) 56 63 (!) 56  Resp: 20 19 18 18   Temp: 98.3 F (36.8 C) 97.9 F (36.6 C) 98.5 F (36.9 C) 98.1 F (36.7 C)  TempSrc: Oral Oral Oral Oral  SpO2: 99% 100% 100% 100%  Weight:      Height:       Weight change:   Intake/Output Summary (Last 24 hours) at 03/18/2021 1331 Last data filed at 03/18/2021 0800 Gross per 24 hour  Intake 703 ml  Output 1102 ml  Net -399 ml   General: thin man sitting in bed CV: brady cardia, regular rhythm, no MRG Pulm: CTA bilaterally, normal work of breathing GI: flat, non tender, normal bowel sounds Psych: tearful affect, mood "bad/depressed"  CBC Latest Ref Rng & Units 03/18/2021 03/17/2021 03/15/2021  WBC 4.0 - 10.5 K/uL 3.7(L) 3.1(L) 3.8(L)  Hemoglobin 13.0 - 17.0 g/dL 05/15/2021 16.6 12.9(L)  Hematocrit 39.0 - 52.0 % 39.8 39.7 38.4(L)  Platelets 150 - 400 K/uL 228 183 146(L)   CMP Latest Ref Rng & Units 03/18/2021 03/17/2021 03/16/2021  Glucose 70 - 99 mg/dL 95 05/16/2021) 95  BUN 6 - 20 mg/dL 10  7 016(W)  Creatinine 0.61 - 1.24 mg/dL <1(U 9.32 3.55  Sodium 135 - 145 mmol/L 137 135 136  Potassium 3.5 - 5.1 mmol/L 3.8 4.0 4.2  Chloride 98 - 111 mmol/L 102 102 104  CO2 22 - 32 mmol/L 27 27 27   Calcium 8.9 - 10.3 mg/dL 9.3 9.1 9.2  Total Protein 6.5 - 8.1 g/dL 6.4(L) 6.0(L) 6.0(L)  Total Bilirubin 0.3 - 1.2 mg/dL 0.7 0.3 0.5  Alkaline Phos 38 - 126 U/L 33(L) 30(L) 30(L)  AST 15 - 41 U/L 43(H) 63(H) 74(H)  ALT 0 - 44 U/L 74(H) 85(H) 78(H)   MRI liver IMPRESSION: 1. No suspicious liver lesion or correlate for the ultrasound abnormality, which was presumably artifactual or related to MRI occult focal steatosis. 2. No cirrhosis or explanation for thrombocytopenia.  Assessment/Plan:  Active Problems:   Alcohol withdrawal (HCC)   Alcohol withdrawal seizure without complication [facility])  RITTER HELSLEY is a 35 year old male with a history of HIV, polysubstance use, and alcohol withdrawal seizures admitted for alcohol withdrawal complicated by seizures and alcoholic hallucinosis.    Alcohol withdrawal - complicated by seizures (resolved) and alcoholic hallucinosis (resolved) CIWA scores of 2 throughout the night. Recieved 2 mg of ativan  though.  - decreased librium to 25 mg daily for just today. - plan to d/c librium in the AM - continue thiamine, multivitamin, and folate - D/C ativan PRN - continue CIWA    Transaminitis  LFTs have been slowly up trending since admission. Previously increased intermittently since 2015. Known history of fatty liver disease. [place] yesterday showed area of hyperechogenicity in L lobe of liver consistent with fat accumulation v poorly defined meningioma, new since last RUQ [place] in  October 2021. MRI revealed no suspicious lesion in liver and lesion seen on [place] believed to be artifact v occult focal steatosis.  - trend CMP    Chest pain Patient reports continued chest pain unchanged in character, but improving in severity. Cardiac monitoring has been  unrevealing. Troponins negative. EKGs no evidence of ischemia. Do not believe chest pain to be cardiac in nature. Possibly secondary to anxiety given patient's significant history and self reported persistent anxiety. Patient declines consultation with psychiatry. However, given persistent bradycardia and hypotension even with decreased librium and ativan. 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  - recommend outpatient psychiatric follow up  - continue cardiac monitoring  - orthostatic vitals - Will check HR with ambulation to ensure appropriate response - echo  Hypotension Hypotensive overnight with okay MAPs, however patient is young and on the smaller side and has had intermittent asymptomatic hypotension throughout admission and is likely physiologic. Though patient reports good oral intake recorded input is minimal and he reports orthostatic dizziness. Patient did not appear dry on exam, however dehydration is possible etiology of his dizziness. Will work up for primary cardiac pathology in setting of persistent hypotension despite decreased benzodiazapines.  - monitor vitals - monitor I&Os - orthostatic vitals - Will check HR with ambulation to ensure appropriate response   Depression Patient reports feeling depressed since he found out his friend died of an OD. He declines psych referral and therapist referral. Declines SSRI. Denies SI.   Dispo:  Patient desires to enter rehab facility after discharge for continued support and treatment for AUD. He has called some of the facilities on the list given to him by social work but has not been successful yet in finding any facility that can take him. He was also given a list of shelters in the area. He has decided to stay with his mom in [place] on discharge.    HIV (CD4 396, VL 790) Continue Genvoya.  Patient to follow-up with infectious disease on discharge   Diet: Regular IVF: N/A DVT  Prophylaxis: Lovenox Code status: Full    Prior to Admission Living Arrangement: Home Anticipated Discharge Location: Extended Rehab facility Barriers to Discharge: Continued medical management  Dispo: Anticipated discharge in approximately 1-2 days    LOS: 7 days   Laverna Peace, Medical Student 03/18/2021, 1:31 PM

## 2021-03-18 NOTE — Plan of Care (Signed)
  Problem: Education: Goal: Expressions of having a comfortable level of knowledge regarding the disease process will increase Outcome: Progressing   Problem: Health Behavior/Discharge Planning: Goal: Compliance with prescribed medication regimen will improve Outcome: Progressing   Problem: Safety: Goal: Verbalization of understanding the information provided will improve Outcome: Progressing   Problem: Physical Regulation: Goal: Complications related to the disease process, condition or treatment will be avoided or minimized Outcome: Progressing

## 2021-03-19 ENCOUNTER — Inpatient Hospital Stay (HOSPITAL_COMMUNITY): Payer: Self-pay

## 2021-03-19 ENCOUNTER — Other Ambulatory Visit (HOSPITAL_COMMUNITY): Payer: Self-pay

## 2021-03-19 DIAGNOSIS — R079 Chest pain, unspecified: Secondary | ICD-10-CM

## 2021-03-19 LAB — ECHOCARDIOGRAM COMPLETE
AR max vel: 3.8 cm2
AV Area VTI: 3.54 cm2
AV Area mean vel: 3.58 cm2
AV Mean grad: 4 mmHg
AV Peak grad: 8.1 mmHg
Ao pk vel: 1.42 m/s
Area-P 1/2: 3.77 cm2
Calc EF: 65.9 %
Height: 73 in
MV M vel: 4.44 m/s
MV Peak grad: 78.9 mmHg
S' Lateral: 2.9 cm
Single Plane A2C EF: 69.6 %
Single Plane A4C EF: 60.6 %
Weight: 2543.23 oz

## 2021-03-19 LAB — COMPREHENSIVE METABOLIC PANEL
ALT: 68 U/L — ABNORMAL HIGH (ref 0–44)
AST: 35 U/L (ref 15–41)
Albumin: 3.6 g/dL (ref 3.5–5.0)
Alkaline Phosphatase: 31 U/L — ABNORMAL LOW (ref 38–126)
Anion gap: 8 (ref 5–15)
BUN: 9 mg/dL (ref 6–20)
CO2: 25 mmol/L (ref 22–32)
Calcium: 9.4 mg/dL (ref 8.9–10.3)
Chloride: 103 mmol/L (ref 98–111)
Creatinine, Ser: 0.64 mg/dL (ref 0.61–1.24)
GFR, Estimated: >60 mL/min (ref >60)
Glucose, Bld: 95 mg/dL (ref 70–99)
Potassium: 3.8 mmol/L (ref 3.5–5.1)
Sodium: 136 mmol/L (ref 135–145)
Total Bilirubin: 0.3 mg/dL (ref 0.3–1.2)
Total Protein: 6.4 g/dL — ABNORMAL LOW (ref 6.5–8.1)

## 2021-03-19 LAB — CBC
HCT: 42.2 % (ref 39.0–52.0)
Hemoglobin: 13.8 g/dL (ref 13.0–17.0)
MCH: 33.3 pg (ref 26.0–34.0)
MCHC: 32.7 g/dL (ref 30.0–36.0)
MCV: 101.7 fL — ABNORMAL HIGH (ref 80.0–100.0)
Platelets: 247 10*3/uL (ref 150–400)
RBC: 4.15 MIL/uL — ABNORMAL LOW (ref 4.22–5.81)
RDW: 13.6 % (ref 11.5–15.5)
WBC: 4.2 10*3/uL (ref 4.0–10.5)
nRBC: 0 % (ref 0.0–0.2)

## 2021-03-19 MED ORDER — ELVITEG-COBIC-EMTRICIT-TENOFAF 150-150-200-10 MG PO TABS
1.0000 | ORAL_TABLET | Freq: Every day | ORAL | 0 refills | Status: DC
  Filled 2021-03-19: qty 30, 30d supply, fill #0

## 2021-03-19 NOTE — Discharge Instructions (Signed)
Alcohol Abuse and Dependence Information, Adult Alcohol is a widely available drug. People drink alcohol in different amounts. People who drink alcohol very often and in large amounts often have problems during and after drinking. They may develop what is called an alcohol use disorder. There are two main types of alcohol use disorders:  Alcohol abuse. This is when you use alcohol too much or too often. You may use alcohol to make yourself feel happy or to reduce stress. You may have a hard time setting a limit on the amount you drink.  Alcohol dependence. This is when you use alcohol consistently for a period of time, and your body changes as a result. This can make it hard to stop drinking because you may start to feel sick or feel different when you do not use alcohol. These symptoms are known as withdrawal. How can alcohol abuse and dependence affect me? Alcohol abuse and dependence can have a negative effect on your life. Drinking too much can lead to addiction. You may feel like you need alcohol to function normally. You may drink alcohol before work in the morning, during the day, or as soon as you get home from work in the evening. These actions can result in:  Poor work performance.  Job loss.  Financial problems.  Car crashes or criminal charges from driving after drinking alcohol.  Problems in your relationships with friends and family.  Losing the trust and respect of coworkers, friends, and family. Drinking heavily over a long period of time can permanently damage your body and brain, and can cause lifelong health issues, such as:  Damage to your liver or pancreas.  Heart problems, high blood pressure, or stroke.  Certain cancers.  Decreased ability to fight infections.  Brain or nerve damage.  Depression.  Early (premature) death. If you are careless or you crave alcohol, it is easy to drink more than your body can handle (overdose). Alcohol overdose is a serious  situation that requires hospitalization. It may lead to permanent injuries or death. What can increase my risk?  Having a family history of alcohol abuse.  Having depression or other mental health conditions.  Beginning to drink at an early age.  Binge drinking often.  Experiencing trauma, stress, and an unstable home life during childhood.  Spending time with people who drink often. What actions can I take to prevent or manage alcohol abuse and dependence?  Do not drink alcohol if: ? Your health care provider tells you not to drink. ? You are pregnant, may be pregnant, or are planning to become pregnant.  If you drink alcohol: ? Limit how much you use to:  0-1 drink a day for women.  0-2 drinks a day for men. ? Be aware of how much alcohol is in your drink. In the [place] one drink equals one 12 oz bottle of beer (355 mL), one 5 oz glass of wine (148 mL), or one 1 oz glass of hard liquor (44 mL).  Stop drinking if you have been drinking too much. This can be very hard to do if you are used to abusing alcohol. If you begin to have withdrawal symptoms, talk with your health care provider or a person that you trust. These symptoms may include anxiety, shaky hands, headache, nausea, sweating, or not being able to sleep.  Choose to drink nonalcoholic beverages in social gatherings and places where there may be alcohol. Activity  Spend more time on activities that you enjoy that  do not involve alcohol, like hobbies or exercise.  Find healthy ways to cope with stress, such as exercise, meditation, or spending time with people you care about. General information  Talk to your family, coworkers, and friends about supporting you in your efforts to stop drinking. If they drink, ask them not to drink around you. Spend more time with people who do not drink alcohol.  If you think that you have an alcohol dependency problem: ? Tell friends or family about your concerns. ? Talk with your  health care provider or another health professional about where to get help. ? Work with a Paramedic and a Network engineer. ? Consider joining a support group for people who struggle with alcohol abuse and dependence. Where to find support  Your health care provider.  SMART Recovery: www.smartrecovery.org Therapy and support groups  Local treatment centers or chemical dependency counselors.  Local AA groups in your community: SalaryStart.tn   Where to find more information  Centers for Disease Control and Prevention: FootballExhibition.com.br  [employer] on Alcohol Abuse and Alcoholism: BasicStudents.dk  Alcoholics Anonymous (AA): SalaryStart.tn Contact a health care provider if:  You drank more or for longer than you intended on more than one occasion.  You tried to stop drinking or to cut back on how much you drink, but you were not able to.  You often drink to the point of vomiting or passing out.  You want to drink so badly that you cannot think about anything else.  You have problems in your life due to drinking, but you continue to drink.  You keep drinking even though you feel anxious, depressed, or have experienced memory loss.  You have stopped doing the things you used to enjoy in order to drink.  You have to drink more than you used to in order to get the effect you want.  You experience anxiety, sweating, nausea, shakiness, and trouble sleeping when you try to stop drinking. Get help right away if:  You have thoughts about hurting yourself or others.  You have serious withdrawal symptoms, including: ? Confusion. ? Racing heart. ? High blood pressure. ? Fever. If you ever feel like you may hurt yourself or others, or have thoughts about taking your own life, get help right away. You can go to your nearest emergency department or call:  Your local emergency services (911 in the [place]  A suicide crisis helpline, such as the National Suicide Prevention  Lifeline at 414-498-6438. This is open 24 hours a day. Summary  Alcohol abuse and dependence can have a negative effect on your life. Drinking too much or too often can lead to addiction.  If you drink alcohol, limit how much you use.  If you are having trouble keeping your drinking under control, find ways to change your behavior. Hobbies, calming activities, exercise, or support groups can help.  If you feel you need help with changing your drinking habits, talk with your health care provider, a good friend, or a therapist, or go to an AA group. This information is not intended to replace advice given to you by your health care provider. Make sure you discuss any questions you have with your health care provider. Document Revised: 02/14/2019 Document Reviewed: 01/03/2019 Elsevier Patient Education  2021 Elsevier Inc.   Alcohol Withdrawal Syndrome Alcohol withdrawal syndrome is a group of symptoms that can develop when a person who drinks heavily and regularly stops drinking or drinks less. Alcohol withdrawal syndrome can be mild  or severe, and it may even be life-threatening. Alcohol withdrawal syndrome usually affects people who have alcohol use disorder, which may also be called alcoholism. Alcohol use disorder is when a person is unable to control his or her alcohol use, and drinking too much or too often causes problems at home, at work, or in relationships. What are the causes? Drinking heavily and drinking on a regular basis cause changes in brain chemistry. Over time, the body becomes dependent on alcohol. When alcohol use stops, the chemistry system in the brain becomes unbalanced and causes the symptoms of alcohol withdrawal. What increases the risk? Alcohol withdrawal syndrome is more likely to occur in people who drink more than the recommended limit of alcohol (2 drinks a day for men or 1 drink a day for non-pregnant women). It is also more likely to affect heavy drinkers who  have been using alcohol for long periods of time. The more a person drinks and the longer he or she drinks, the greater the risk of alcohol withdrawal syndrome. Severe withdrawal is more likely to develop in someone who:  Had severe alcohol withdrawal in the past.  Had a seizure during a previous episode of alcohol withdrawal.  Is elderly.  Uses other drugs.  Has a long-term (chronic) medical problem, such as heart, lung, or liver disease.  Has depression.  Does not get enough nutrients from his or her diet (malnutrition). What are the signs or symptoms? Symptoms of this condition can be mild to moderate, or they can be severe. Symptoms may develop a few hours (or up to a day) after a person changes his or her drinking patterns. During the 48 hours after he or she has stopped drinking, the following symptoms may go away or get better:  Uncontrollable shaking (tremor).  Sweating.  Headache.  Anxiety.  Inability to relax (agitation).  Trouble sleeping (insomnia).  Irregular heartbeats (palpitations).  Alcohol cravings.  Seizure. The following symptoms may get worse 24-48 hours after a person has decreased or stopped alcohol use, and they may gradually improve over a period of days or weeks:  Nausea and vomiting.  Fatigue.  Sensitivity to light and sounds.  Confusion and inability to think clearly.  Loss of appetite.  Mood swings, irritability, depression, and anxiety.  Insomnia and nightmares. The following symptoms are severe and life-threatening. When these symptoms occur together, they are called delirium tremens (DTs):  High blood pressure.  Increased heart rate.  Trouble breathing.  Seizures. These may go away along with other symptoms, or they may persist.  Seeing, hearing, feeling, smelling, or tasting things that are not there (hallucinations). If you experience hallucinations, they usually begin 12-24 hours after a change in drinking  patterns. Delirium tremens requires immediate hospitalization. How is this diagnosed? This condition may be diagnosed based on:  Your symptoms and medical history.  Your history of alcohol use. Your health care provider may ask questions about your drinking behavior. It is important to be honest when you answer these questions.  A psychological assessment.  A physical exam.  Blood tests or urine tests to measure blood alcohol level and to rule out other causes of symptoms.  MRI or CT scan. This may be done if you seem to have abnormal thinking or behaviors (altered mental status). Diagnosis can be difficult. People going through withdrawal often avoid seeking medical care and are not thinking clearly. Friends and family members play an important role in recognizing symptoms and encouraging loved ones to get treatment.  How is this treated? Most people with symptoms of withdrawal can be treated outside of a hospital setting (outpatient treatment), with close monitoring such as daily check-ins with a health care provider and counseling. You may need treatment at a hospital or treatment center (inpatient treatment) if:  You have a history of delirium tremens or seizures.  You have severe symptoms.  You are addicted to other drugs.  You cannot swallow medicine.  You have a serious medical condition such as heart failure.  You experienced withdrawal in the past but then you continued drinking alcohol.  You are not likely to commit to an outpatient treatment schedule. Treatment may involve:  Monitoring your blood pressure, pulse, and breathing.  IV fluids to keep you hydrated.  Medicines to reduce withdrawal symptoms and discomfort (benzodiazepines).  Medicine to reduce anxiety.  Medicine to prevent or control seizures.  Multivitamins and B vitamins.  Having a health care provider check on you daily. It is important to get treatment for alcohol withdrawal early. Getting  treatment early can:  Speed up your recovery from withdrawal symptoms.  Make you more successful with long-term stoppage of alcohol use (sobriety). If you need help to stop drinking, your health care provider may recommend a long-term treatment plan that includes:  Medicines to help treat alcohol use disorder.  Substance abuse counseling.  Support groups. Follow these instructions at home:  Take over-the-counter and prescription medicines (including vitamin supplements) only as told by your health care provider.  Do not drink alcohol.  Do not drive until your health care provider approves.  Have someone you trust stay with you or be available if you need help with your symptoms or with not drinking.  Drink enough fluid to keep your urine pale yellow.  Consider joining an alcohol support group or treatment program. These can provide emotional support, advice, and guidance.  Keep all follow-up visits as told by your health care provider. This is important.   Contact a health care provider if:  Your symptoms get worse instead of better.  You cannot eat or drink without vomiting.  You are struggling with not drinking alcohol.  You cannot stop drinking alcohol. Get help right away if:  You have an irregular heartbeat.  You have chest pain.  You have trouble breathing.  You have a seizure for the first time.  You hallucinate.  You become very confused. Summary  Alcohol withdrawal is a group of symptoms that can develop when a person who drinks heavily and regularly stops drinking or drinks less.  Symptoms of this condition can be mild to moderate, or they can be severe.  Treatment may include hospitalization, medicine, and counseling. This information is not intended to replace advice given to you by your health care provider. Make sure you discuss any questions you have with your health care provider. Document Revised: 10/08/2017 Document Reviewed:  07/02/2017 Elsevier Patient Education  2021 [employer].

## 2021-03-19 NOTE — Progress Notes (Signed)
CSW following for discharge plans. CSW received call back from [facility], that they are unable to offer a bed at this time as they are concerned about his foot wound and that he hasn't been compliant with his HIV medications. They will need his foot wound to be completely healed (per MD, it is healed at this time), and to also be stable on his HIV medications for 2-4 weeks. CSW checked the chart and patient has been taking it here. CSW updated MD, and patient is to discharge to his mom's house today with instructions to follow back up with [facility] after another week of consistently taking his medications for them to reassess. CSW placed instructions on patient's AVS.  Blenda Nicely, LCSW Clinical Social Worker [phone]

## 2021-03-19 NOTE — TOC Transition Note (Signed)
Transition of Care [facility]) - CM/SW Discharge Note   Patient Details  Name: Gary [name] MRN: [id number] Date of Birth: 1986-05-17  Transition of Care [facility]) CM/SW Contact:  Darleene Cleaver, LCSW Phone Number: 03/19/2021, 1:13 PM   Clinical Narrative:    Patient plans to discharge to his friend's house here in [place] today so he can go to his appointment tomorrow.  CSW was informed that patient will need a ride back to his mom's house tomorrow after his infectious disease appointment.  Per patient he will walk to his appointment tomorrow at [facility], Dr. Paulette Blanch [place], [address] [place], [place], [address], [phone] at 3:30pm.    Patient states he will need a ride home to his mom Gary [name]'s house at [address] [place], [place], [address], [phone].  Patient to contact [employer] at [phone] once he is finished with his appointment.  Phone number attached to patient's AVS under follow up appointment with infectious disease physician.   CSW emailed copy of the transportation waiver and transportation services patient intake request to [email or web address].  CSW notified patient and his bedside nurse.  CSW signing off please reconsult for other social work needs arise.     Final next level of care: Home/Self Care Barriers to Discharge: Barriers Resolved   Patient Goals and CMS Choice Patient states their goals for this hospitalization and ongoing recovery are:: To return to his mother's house after his appointment tomorrow      Discharge Placement  Patient plans to go to a friend's house today, then go to his mom's after his appointment with infectious disease tomorrow.    Discharge Plan and Services                                     Social Determinants of Health (SDOH) Interventions     Readmission Risk Interventions No flowsheet data found.

## 2021-03-19 NOTE — Plan of Care (Signed)
Adequate for discharge.

## 2021-03-19 NOTE — TOC Progression Note (Deleted)
Transition of Care [facility]) - Progression Note    Patient Details  Name: Gary [name] MRN: [id number] Date of Birth: 06-10-1986  Transition of Care [facility]) CM/SW Contact  Darleene Cleaver, [place] Phone Number: 03/19/2021, 1:02 PM  Clinical Narrative:     Patient plans to discharge to his friend's house here in [place] today so he can go to his appointment tomorrow.  CSW was informed that patient will need a ride back to his mom's house tomorrow after his infectious disease appointment.  Per patient he will walk to his appointment tomorrow at [facility], Dr. Paulette Blanch [place], [address] [place], [place], [address], [phone] at 3:30pm.    Patient states he will need a ride home to his mom Gary [name]'s house at [address] [place], [place], [address], [phone].  Patient to contact [employer] at [phone] once he is finished with his appointment.  Phone number attached to patient's AVS under follow up appointment with infectious disease physician.   CSW emailed copy of the transportation waiver and transportation services patient intake request to [email or web address].  CSW notified patient and his bedside nurse.  CSW signing off please reconsult for other social work needs arise.     Expected Discharge Plan and Services           Expected Discharge Date: 03/18/21                                     Social Determinants of Health (SDOH) Interventions    Readmission Risk Interventions No flowsheet data found.

## 2021-03-19 NOTE — Progress Notes (Signed)
*  PRELIMINARY RESULTS* Echocardiogram 2D Echocardiogram has been performed.  Neomia Dear  RDCS 03/19/2021, 8:39 AM

## 2021-03-19 NOTE — Progress Notes (Signed)
CSW contacted June with [facility] earlier today to discuss the patient, and they will need to review his clinical information. CSW printed clinicals and faxed over. Per June, they won't have a bed until Monday at the earliest, but they will review his information and see if they could take him when a bed is available. CSW to follow, awaiting call back.

## 2021-03-19 NOTE — Plan of Care (Signed)
  Problem: Education: Goal: Expressions of having a comfortable level of knowledge regarding the disease process will increase Outcome: Progressing   Problem: Coping: Goal: Ability to adjust to condition or change in health will improve Outcome: Progressing Goal: Ability to identify appropriate support needs will improve Outcome: Progressing   Problem: Health Behavior/Discharge Planning: Goal: Compliance with prescribed medication regimen will improve Outcome: Progressing   Problem: Medication: Goal: Risk for medication side effects will decrease Outcome: Progressing   Problem: Clinical Measurements: Goal: Complications related to the disease process, condition or treatment will be avoided or minimized Outcome: Progressing Goal: Diagnostic test results will improve Outcome: Progressing   Problem: Safety: Goal: Verbalization of understanding the information provided will improve Outcome: Progressing   Problem: Self-Concept: Goal: Level of anxiety will decrease Outcome: Progressing Goal: Ability to verbalize feelings about condition will improve Outcome: Progressing   Problem: Self-Concept: Goal: Level of anxiety will decrease Outcome: Progressing Goal: Ability to verbalize feelings about condition will improve Outcome: Progressing   Problem: Safety: Goal: Verbalization of understanding the information provided will improve Outcome: Progressing   Problem: Education: Goal: Knowledge of disease or condition will improve Outcome: Progressing Goal: Understanding of discharge needs will improve Outcome: Progressing   Problem: Education: Goal: Knowledge of disease or condition will improve Outcome: Progressing Goal: Understanding of discharge needs will improve Outcome: Progressing   Problem: Health Behavior/Discharge Planning: Goal: Ability to identify changes in lifestyle to reduce recurrence of condition will improve Outcome: Progressing Goal: Identification of  resources available to assist in meeting health care needs will improve Outcome: Progressing

## 2021-03-20 ENCOUNTER — Other Ambulatory Visit: Payer: Self-pay

## 2021-03-20 ENCOUNTER — Ambulatory Visit (INDEPENDENT_AMBULATORY_CARE_PROVIDER_SITE_OTHER): Payer: Self-pay | Admitting: Infectious Disease

## 2021-03-20 ENCOUNTER — Encounter: Payer: Self-pay | Admitting: Infectious Disease

## 2021-03-20 VITALS — BP 96/57 | HR 99 | Temp 98.2°F | Wt 159.0 lb

## 2021-03-20 DIAGNOSIS — F332 Major depressive disorder, recurrent severe without psychotic features: Secondary | ICD-10-CM

## 2021-03-20 DIAGNOSIS — F1010 Alcohol abuse, uncomplicated: Secondary | ICD-10-CM

## 2021-03-20 DIAGNOSIS — B20 Human immunodeficiency virus [HIV] disease: Secondary | ICD-10-CM

## 2021-03-20 NOTE — Progress Notes (Signed)
Subjective:    Chief complaint: "It hurts" he says having walked 7+ miles to get to our clinic   Patient ID: Gary [name], male    DOB: 1985/12/06, 35 y.o.   MRN: [id number]  HPI  35 year old Caucasian man who has HIV infection and struggles with alcoholism. He was last seen by Gary [name] in 2021.He has been on Genvoya when he has had access to it.  He was recently admitted to the Internal Medicine service for alcohol intoxication and a prolonged alcoholic withdrawal. He was given Genvoya as an inpatient but not provided meds at DC. We also did not see him in person in the hospital --which I believe should have happened.  He was discharged within the past few days and walked to clinic "more than 7 miles" and was late by an hour for his appointment  Gary [name] came to clinic an met with him and gave him his contact information.  The patient is going to enter into a rehab program for his alcoholism. He has not had HMAP activated yet.  I would like to switch him to [facility] and I had an extended discussion re why I believe that drugs such as Dovato and Biktarvy are superior to Whiteside and have a higher barrier to R which is pertinent to Gary [name] given that he admits he does not reliably take his medicaitions when he is drunk.     Past Medical History:  Diagnosis Date  . Alcohol abuse   . HIV (human immunodeficiency virus infection) ([place])   . Subdural hematoma [facility]     Past Surgical History:  Procedure Laterality Date  . INCISION AND DRAINAGE PERIRECTAL ABSCESS N/A 09/24/2016   Procedure: IRRIGATION AND DEBRIDEMENT PERIRECTAL ABSCESS;  Surgeon: Clayburn Pert, MD;  Location: [facility] ORS;  Service: General;  Laterality: N/A;  . none      Family History  Problem Relation Age of Onset  . Alcohol abuse Mother   . Alcohol abuse Father       Social History   Socioeconomic History  . Marital status: Single    Spouse name: Not on file  . Number of children: Not on file   . Years of education: Not on file  . Highest education level: Not on file  Occupational History  . Occupation: unemployed  Tobacco Use  . Smoking status: Current Every Day Smoker    Packs/day: 0.50    Types: Cigarettes  . Smokeless tobacco: Never Used  . Tobacco comment: unable to smoke while incarcerated 6+ months 02/13/20  Vaping Use  . Vaping Use: Never used  Substance and Sexual Activity  . Alcohol use: Yes    Comment: "I drink all I can"  . Drug use: Not Currently    Types: Marijuana, Methamphetamines    Comment: last used 04/10/2019  . Sexual activity: Yes    Partners: Male, Male    Comment: declined condoms  03/2021  Other Topics Concern  . Not on file  Social History Narrative   "Currently living on the streets"   Independent at baseline   Social Determinants of Health   Financial Resource Strain: Not on file  Food Insecurity: Not on file  Transportation Needs: Not on file  Physical Activity: Not on file  Stress: Not on file  Social Connections: Not on file    Allergies  Allergen Reactions  . Tegretol [Carbamazepine] Other (See Comments)    Caused vertigo for 2 days after taking it  . Caffeine  Palpitations     Current Outpatient Medications:  .  elvitegravir-cobicistat-emtricitabine-tenofovir (GENVOYA) 150-150-200-10 MG TABS tablet, Take 1 tablet by mouth daily with breakfast., Disp: 30 tablet, Rfl: 0   Review of Systems  Constitutional: Negative for activity change, appetite change, chills, diaphoresis, fatigue, fever and unexpected weight change.  HENT: Negative for congestion, rhinorrhea, sinus pressure, sneezing, sore throat and trouble swallowing.   Eyes: Negative for photophobia and visual disturbance.  Respiratory: Negative for cough, chest tightness, shortness of breath, wheezing and stridor.   Cardiovascular: Negative for chest pain, palpitations and leg swelling.  Gastrointestinal: Negative for abdominal distention, abdominal pain, anal  bleeding, blood in stool, constipation, diarrhea, nausea and vomiting.  Genitourinary: Negative for difficulty urinating, dysuria, flank pain and hematuria.  Musculoskeletal: Positive for myalgias. Negative for arthralgias, back pain, gait problem and joint swelling.  Skin: Negative for color change, pallor, rash and wound.  Neurological: Negative for dizziness, tremors, weakness and light-headedness.  Hematological: Negative for adenopathy. Does not bruise/bleed easily.  Psychiatric/Behavioral: Positive for dysphoric mood. Negative for agitation, behavioral problems, confusion, decreased concentration and sleep disturbance. The patient is nervous/anxious.        Objective:   Physical Exam Constitutional:      Appearance: He is well-developed.  HENT:     Head: Normocephalic and atraumatic.  Eyes:     Extraocular Movements: Extraocular movements intact.     Conjunctiva/sclera: Conjunctivae normal.  Cardiovascular:     Rate and Rhythm: Normal rate and regular rhythm.  Pulmonary:     Effort: Pulmonary effort is normal. No respiratory distress.     Breath sounds: No wheezing.  Abdominal:     General: There is no distension.     Palpations: Abdomen is soft.  Musculoskeletal:        General: No tenderness. Normal range of motion.     Cervical back: Normal range of motion and neck supple.  Skin:    General: Skin is warm and dry.     Coloration: Skin is not pale.     Findings: No erythema or rash.  Neurological:     General: No focal deficit present.     Mental Status: He is alert and oriented to person, place, and time.  Psychiatric:        Attention and Perception: Attention normal.        Mood and Affect: Mood is depressed. Affect is flat.        Speech: Speech normal.        Behavior: Behavior normal.        Thought Content: Thought content normal.        Cognition and Memory: Cognition and memory normal.           Assessment & Plan:   HIV disease: I am switching him  to [facility] and I provided him with 28 days worth of samples in blister packs  We will check labs today and we are scheduling him to come back in a month (though he may still be in rehab he can call Mitch and let him know if this is the case   Alcoholism: clearly needs to be in treatment and I hope he follows through with going into rehab  Depression: he needs to be in consistent counseling.   I spent greater than 40 minutes with the patient including greater than 50% of time in face to face counsel of the patient and in coordination of his  care.

## 2021-03-21 LAB — T-HELPER CELL (CD4) - (RCID CLINIC ONLY)
CD4 % Helper T Cell: 33 % (ref 33–65)
CD4 T Cell Abs: 423 /uL (ref 400–1790)

## 2021-03-24 LAB — COMPLETE METABOLIC PANEL WITH GFR
AG Ratio: 1.8 (calc) (ref 1.0–2.5)
ALT: 59 U/L — ABNORMAL HIGH (ref 9–46)
AST: 33 U/L (ref 10–40)
Albumin: 4.4 g/dL (ref 3.6–5.1)
Alkaline phosphatase (APISO): 39 U/L (ref 36–130)
BUN: 11 mg/dL (ref 7–25)
CO2: 23 mmol/L (ref 20–32)
Calcium: 9 mg/dL (ref 8.6–10.3)
Chloride: 111 mmol/L — ABNORMAL HIGH (ref 98–110)
Creat: 0.93 mg/dL (ref 0.60–1.35)
GFR, Est African American: 124 mL/min/{1.73_m2} (ref >=60)
GFR, Est Non African American: 107 mL/min/{1.73_m2} (ref >=60)
Globulin: 2.5 g/dL (calc) (ref 1.9–3.7)
Glucose, Bld: 123 mg/dL — ABNORMAL HIGH (ref 65–99)
Potassium: 4.2 mmol/L (ref 3.5–5.3)
Sodium: 144 mmol/L (ref 135–146)
Total Bilirubin: 0.3 mg/dL (ref 0.2–1.2)
Total Protein: 6.9 g/dL (ref 6.1–8.1)

## 2021-03-24 LAB — HIV-1 RNA QUANT-NO REFLEX-BLD
HIV 1 RNA Quant: 132 Copies/mL — ABNORMAL HIGH
HIV-1 RNA Quant, Log: 2.12 Log cps/mL — ABNORMAL HIGH

## 2021-03-24 LAB — CBC WITH DIFFERENTIAL/PLATELET

## 2021-03-24 LAB — RPR: RPR Ser Ql: NONREACTIVE

## 2021-04-03 ENCOUNTER — Encounter (HOSPITAL_COMMUNITY): Payer: Self-pay | Admitting: Emergency Medicine

## 2021-04-03 ENCOUNTER — Emergency Department (HOSPITAL_COMMUNITY)
Admission: EM | Admit: 2021-04-03 | Discharge: 2021-04-03 | Disposition: A | Discharge status: Home / Self Care | Payer: Self-pay | Attending: Emergency Medicine | Admitting: Emergency Medicine

## 2021-04-03 DIAGNOSIS — F10920 Alcohol use, unspecified with intoxication, uncomplicated: Secondary | ICD-10-CM

## 2021-04-03 DIAGNOSIS — F10129 Alcohol abuse with intoxication, unspecified: Secondary | ICD-10-CM | POA: Insufficient documentation

## 2021-04-03 DIAGNOSIS — F17210 Nicotine dependence, cigarettes, uncomplicated: Secondary | ICD-10-CM | POA: Insufficient documentation

## 2021-04-03 DIAGNOSIS — Y908 Blood alcohol level of 240 mg/100 ml or more: Secondary | ICD-10-CM | POA: Insufficient documentation

## 2021-04-03 DIAGNOSIS — F1010 Alcohol abuse, uncomplicated: Secondary | ICD-10-CM

## 2021-04-03 DIAGNOSIS — R45851 Suicidal ideations: Secondary | ICD-10-CM | POA: Insufficient documentation

## 2021-04-03 DIAGNOSIS — Z21 Asymptomatic human immunodeficiency virus [HIV] infection status: Secondary | ICD-10-CM | POA: Insufficient documentation

## 2021-04-03 LAB — RAPID URINE DRUG SCREEN, HOSP PERFORMED
Amphetamines: NOT DETECTED
Barbiturates: NOT DETECTED
Benzodiazepines: POSITIVE — AB
Cocaine: NOT DETECTED
Opiates: NOT DETECTED
Tetrahydrocannabinol: NOT DETECTED

## 2021-04-03 LAB — CBC
HCT: 43.8 % (ref 39.0–52.0)
Hemoglobin: 14.5 g/dL (ref 13.0–17.0)
MCH: 33.7 pg (ref 26.0–34.0)
MCHC: 33.1 g/dL (ref 30.0–36.0)
MCV: 101.9 fL — ABNORMAL HIGH (ref 80.0–100.0)
Platelets: 233 10*3/uL (ref 150–400)
RBC: 4.3 MIL/uL (ref 4.22–5.81)
RDW: 14.1 % (ref 11.5–15.5)
WBC: 6.2 10*3/uL (ref 4.0–10.5)
nRBC: 0 % (ref 0.0–0.2)

## 2021-04-03 LAB — COMPREHENSIVE METABOLIC PANEL
ALT: 31 U/L (ref 0–44)
AST: 33 U/L (ref 15–41)
Albumin: 4 g/dL (ref 3.5–5.0)
Alkaline Phosphatase: 40 U/L (ref 38–126)
Anion gap: 10 (ref 5–15)
BUN: <5 mg/dL — ABNORMAL LOW (ref 6–20)
CO2: 25 mmol/L (ref 22–32)
Calcium: 8.6 mg/dL — ABNORMAL LOW (ref 8.9–10.3)
Chloride: 106 mmol/L (ref 98–111)
Creatinine, Ser: 0.67 mg/dL (ref 0.61–1.24)
GFR, Estimated: >60 mL/min (ref >60)
Glucose, Bld: 117 mg/dL — ABNORMAL HIGH (ref 70–99)
Potassium: 3 mmol/L — ABNORMAL LOW (ref 3.5–5.1)
Sodium: 141 mmol/L (ref 135–145)
Total Bilirubin: 0.5 mg/dL (ref 0.3–1.2)
Total Protein: 7 g/dL (ref 6.5–8.1)

## 2021-04-03 LAB — ACETAMINOPHEN LEVEL: Acetaminophen (Tylenol), Serum: <10 ug/mL — ABNORMAL LOW (ref 10–30)

## 2021-04-03 LAB — ETHANOL: Alcohol, Ethyl (B): 496 mg/dL (ref <10)

## 2021-04-03 LAB — SALICYLATE LEVEL: Salicylate Lvl: <7 mg/dL — ABNORMAL LOW (ref 7.0–30.0)

## 2021-04-03 NOTE — ED Triage Notes (Addendum)
Per Sharin Mons, [facility] called them stating that pt was making suicidal statements "by drinking himself to death".  Pt is very intoxicated.  While he can be woken up, he does not communicate w/ RN   130/50 RR 18 94% RA HR 77

## 2021-04-03 NOTE — ED Provider Notes (Signed)
[facility] EMERGENCY DEPARTMENT Provider Note   CSN: [id number] Arrival date & time: 04/03/21  0151     History Chief Complaint  Patient presents with  . Suicidal  . Alcohol Intoxication    Gary [name] is a 35 y.o. male.  The history is provided by the patient.  Alcohol Intoxication This is a recurrent problem. The current episode started 12 to 24 hours ago. The problem occurs constantly. The problem has not changed since onset.Pertinent negatives include no chest pain, no abdominal pain, no headaches and no shortness of breath. Nothing aggravates the symptoms. Nothing relieves the symptoms. He has tried nothing for the symptoms. The treatment provided no relief.  Reports being in withdrawals with elevated ETOH and appearing intoxicated and sleeping.  Patient denies SI or HI.  He wants medication to keep him from withdrawling but states he wants to go back and drink.       Past Medical History:  Diagnosis Date  . Alcohol abuse   . HIV (human immunodeficiency virus infection) (HCC)   . Subdural hematoma [facility])     Patient Active Problem List   Diagnosis Date Noted  . Alcohol withdrawal seizure without complication (HCC)   . 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 Elevated troponin 10/19/2020  . Alcohol withdrawal (HCC) 10/19/2020  . Alcohol abuse   . Suicidal ideation 06/14/2020  . Pancreatitis 06/13/2020  . 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  . Malnutrition of moderate degree 05/07/2020  . Gastrointestinal hemorrhage   . Alcoholic hepatitis without ascites   . Melena 05/05/2020  . Alcoholic ketoacidosis 05/05/2020  . Thrombocytopenia (HCC) 07/25/2019  . Transaminitis 07/25/2019  . Traumatic subdural hematoma (HCC) 07/02/2019  . Alcohol abuse with intoxication (HCC) 07/02/2019  . 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 Alcohol use disorder, severe, dependence (HCC) 04/16/2019  . 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 HIV disease (HCC) 06/16/2018  . Polysubstance abuse (HCC) 06/16/2018  . Cigarette smoker 06/16/2018    Past Surgical History:  Procedure Laterality Date  . INCISION AND DRAINAGE PERIRECTAL ABSCESS N/A 09/24/2016   Procedure: IRRIGATION AND DEBRIDEMENT PERIRECTAL ABSCESS;  Surgeon: Ricarda Frame, MD;  Location: [facility] ORS;  Service: General;  Laterality: N/A;  . none         Family History  Problem Relation Age of Onset  . Alcohol abuse Mother   . Alcohol abuse Father     Social History   Tobacco Use  . Smoking status: Current Every Day Smoker    Packs/day: 0.50    Types: Cigarettes  . Smokeless tobacco: Never Used  . Tobacco comment: unable to smoke while incarcerated 6+ months 02/13/20  Vaping Use  . Vaping Use: Never used  Substance Use Topics  . Alcohol use: Yes    Comment: "I drink all I can"  . Drug use: Not Currently    Types: Marijuana, Methamphetamines    Comment: last used 04/10/2019    Home Medications Prior to Admission medications   Medication Sig Start Date End Date Taking? Authorizing Provider  elvitegravir-cobicistat-emtricitabine-tenofovir (GENVOYA) 150-150-200-10 MG TABS tablet Take 1 tablet by mouth daily with breakfast. 03/19/21   Eliezer Bottom, MD  famotidine (PEPCID) 20 MG tablet Take 1 tablet (20 mg total) by mouth 2 (two) times daily. Patient not taking: Reported on 06/01/2019 05/14/19 06/02/19  Benjiman Core, MD    Allergies    Tegretol [carbamazepine] and Caffeine  Review of Systems   Review of  Systems  Constitutional: Negative for fever.  HENT: Negative for congestion.   Eyes: Negative for visual disturbance.  Respiratory: Negative for shortness of breath.   Cardiovascular: Negative for chest pain.  Gastrointestinal: Negative for abdominal pain.  Genitourinary: Negative for difficulty urinating.  Musculoskeletal: Negative for arthralgias.  Neurological: Negative for headaches.   Psychiatric/Behavioral: Negative for agitation and suicidal ideas.  All other systems reviewed and are negative.   Physical Exam Updated Vital Signs BP 96/66 (BP Location: Right Arm)   Pulse 61   Temp (!) 97.4 F (36.3 C) (Oral)   Resp 14   SpO2 97%   Physical Exam Vitals and nursing note reviewed.  Constitutional:      General: He is not in acute distress.    Appearance: Normal appearance.  HENT:     Head: Normocephalic and atraumatic.     Nose: Nose normal.  Eyes:     Conjunctiva/sclera: Conjunctivae normal.     Pupils: Pupils are equal, round, and reactive to light.  Cardiovascular:     Rate and Rhythm: Normal rate and regular rhythm.     Pulses: Normal pulses.     Heart sounds: Normal heart sounds.  Pulmonary:     Effort: Pulmonary effort is normal.     Breath sounds: Normal breath sounds.  Abdominal:     General: Abdomen is flat. Bowel sounds are normal.     Palpations: Abdomen is soft.     Tenderness: There is no abdominal tenderness. There is no guarding.  Musculoskeletal:        General: Normal range of motion.     Cervical back: Normal range of motion and neck supple.  Skin:    General: Skin is warm and dry.     Capillary Refill: Capillary refill takes less than 2 seconds.  Neurological:     General: No focal deficit present.     Mental Status: He is alert and oriented to person, place, and time.     Deep Tendon Reflexes: Reflexes normal.  Psychiatric:        Mood and Affect: Mood normal.        Behavior: Behavior normal.        Thought Content: Thought content does not include homicidal or suicidal ideation. Thought content does not include homicidal or suicidal plan.     ED Results / Procedures / Treatments   Labs (all labs ordered are listed, but only abnormal results are displayed) Results for orders placed or performed during the hospital encounter of 04/03/21  Comprehensive metabolic panel  Result Value Ref Range   Sodium 141 135 - 145 mmol/L    Potassium 3.0 (L) 3.5 - 5.1 mmol/L   Chloride 106 98 - 111 mmol/L   CO2 25 22 - 32 mmol/L   Glucose, Bld 117 (H) 70 - 99 mg/dL   BUN <5 (L) 6 - 20 mg/dL   Creatinine, Ser 8.11 0.61 - 1.24 mg/dL   Calcium 8.6 (L) 8.9 - 10.3 mg/dL   Total Protein 7.0 6.5 - 8.1 g/dL   Albumin 4.0 3.5 - 5.0 g/dL   AST 33 15 - 41 U/L   ALT 31 0 - 44 U/L   Alkaline Phosphatase 40 38 - 126 U/L   Total Bilirubin 0.5 0.3 - 1.2 mg/dL   GFR, Estimated >91 >47 mL/min   Anion gap 10 5 - 15  Ethanol  Result Value Ref Range   Alcohol, Ethyl (B) 496 (HH) <10 mg/dL  Salicylate level  Result  Value Ref Range   Salicylate Lvl <7.0 (L) 7.0 - 30.0 mg/dL  Acetaminophen level  Result Value Ref Range   Acetaminophen (Tylenol), Serum <10 (L) 10 - 30 ug/mL  cbc  Result Value Ref Range   WBC 6.2 4.0 - 10.5 K/uL   RBC 4.30 4.22 - 5.81 MIL/uL   Hemoglobin 14.5 13.0 - 17.0 g/dL   HCT 29.5 62.1 - 30.8 %   MCV 101.9 (H) 80.0 - 100.0 fL   MCH 33.7 26.0 - 34.0 pg   MCHC 33.1 30.0 - 36.0 g/dL   RDW 65.7 84.6 - 96.2 %   Platelets 233 150 - 400 K/uL   nRBC 0.0 0.0 - 0.2 %  Rapid urine drug screen (hospital performed)  Result Value Ref Range   Opiates NONE DETECTED NONE DETECTED   Cocaine NONE DETECTED NONE DETECTED   Benzodiazepines POSITIVE (A) NONE DETECTED   Amphetamines NONE DETECTED NONE DETECTED   Tetrahydrocannabinol NONE DETECTED NONE DETECTED   Barbiturates NONE DETECTED NONE DETECTED   CT HEAD WO CONTRAST  Result Date: 03/11/2021 CLINICAL DATA:  Head trauma, moderate/severe. Additional history provided: Seizure EXAM: CT HEAD WITHOUT CONTRAST TECHNIQUE: Contiguous axial images were obtained from the base of the skull through the vertex without intravenous contrast. COMPARISON:  Prior head CT examinations 03/07/2021. FINDINGS: Brain: Cerebral volume is normal. There is no acute intracranial hemorrhage. No demarcated cortical infarct. No extra-axial fluid collection. No evidence of intracranial mass. No midline  shift. Vascular: No hyperdense vessel. Skull: Normal. Negative for fracture or focal lesion. Sinuses/Orbits: Visualized orbits show no acute finding. Trace bilateral ethmoid sinus mucosal thickening. IMPRESSION: No evidence of acute intracranial abnormality. Electronically Signed   By: Jackey Loge DO   On: 03/11/2021 11:25   CT Head Wo Contrast  Result Date: 03/08/2021 CLINICAL DATA:  Found in store after drinking 4 bottles of wine, suicidal EXAM: CT HEAD WITHOUT CONTRAST CT CERVICAL SPINE WITHOUT CONTRAST TECHNIQUE: Multidetector CT imaging of the head and cervical spine was performed following the standard protocol without intravenous contrast. Multiplanar CT image reconstructions of the cervical spine were also generated. COMPARISON:  CT head 02/21/2021, CT cervical spine 01/12/2021 FINDINGS: CT HEAD FINDINGS Brain: No evidence of acute infarction, hemorrhage, hydrocephalus, extra-axial collection, visible mass lesion or mass effect. Vascular: No hyperdense vessel or unexpected calcification. Skull: No calvarial fracture or suspicious osseous lesion. No scalp swelling or hematoma. Sinuses/Orbits: Paranasal sinuses and mastoid air cells are predominantly clear. Included orbital structures are unremarkable. Other: Debris in the bilateral external auditory canals. CT CERVICAL SPINE FINDINGS Alignment: Preservation of the normal cervical lordosis. Stepwise retrolisthesis C3-C7 is unchanged from comparison exam and favored to be on a spondylitic basis. No evidence of traumatic listhesis. No abnormally widened, perched or jumped facets. Normal alignment of the craniocervical and atlantoaxial articulations. Skull base and vertebrae: No acute skull base fracture. No vertebral body fracture or height loss. Normal bone mineralization. No worrisome osseous lesions. Mild arthrosis about the atlantodental interval. Multilevel spondylitic changes as detailed below. Soft tissues and spinal canal: No pre or paravertebral  fluid or swelling. No visible canal hematoma. Visible airways are patent. Disc levels: Multilevel intervertebral disc height loss with spondylitic endplate changes. Small disc osteophyte complexes partially efface the ventral thecal sac most pronounced at C4-5 where there is mild canal stenosis. Slight uncinate spurring facet hypertrophic changes result in mild bilateral foraminal narrowing C6-7 bilaterally. Upper chest: No acute abnormality in the upper chest or imaged lung apices. Other: None. IMPRESSION: No acute  intracranial abnormality. No acute cervical spine fracture or traumatic listhesis. Multilevel cervical spondylitic changes and likely degenerative stepwise retrolisthesis, stable from prior. Debris in the bilateral external auditory canals, correlate for cerumen impaction. Electronically Signed   By: Kreg Shropshire M.D.   On: 03/08/2021 00:56   CT Cervical Spine Wo Contrast  Result Date: 03/08/2021 CLINICAL DATA:  Found in store after drinking 4 bottles of wine, suicidal EXAM: CT HEAD WITHOUT CONTRAST CT CERVICAL SPINE WITHOUT CONTRAST TECHNIQUE: Multidetector CT imaging of the head and cervical spine was performed following the standard protocol without intravenous contrast. Multiplanar CT image reconstructions of the cervical spine were also generated. COMPARISON:  CT head 02/21/2021, CT cervical spine 01/12/2021 FINDINGS: CT HEAD FINDINGS Brain: No evidence of acute infarction, hemorrhage, hydrocephalus, extra-axial collection, visible mass lesion or mass effect. Vascular: No hyperdense vessel or unexpected calcification. Skull: No calvarial fracture or suspicious osseous lesion. No scalp swelling or hematoma. Sinuses/Orbits: Paranasal sinuses and mastoid air cells are predominantly clear. Included orbital structures are unremarkable. Other: Debris in the bilateral external auditory canals. CT CERVICAL SPINE FINDINGS Alignment: Preservation of the normal cervical lordosis. Stepwise retrolisthesis  C3-C7 is unchanged from comparison exam and favored to be on a spondylitic basis. No evidence of traumatic listhesis. No abnormally widened, perched or jumped facets. Normal alignment of the craniocervical and atlantoaxial articulations. Skull base and vertebrae: No acute skull base fracture. No vertebral body fracture or height loss. Normal bone mineralization. No worrisome osseous lesions. Mild arthrosis about the atlantodental interval. Multilevel spondylitic changes as detailed below. Soft tissues and spinal canal: No pre or paravertebral fluid or swelling. No visible canal hematoma. Visible airways are patent. Disc levels: Multilevel intervertebral disc height loss with spondylitic endplate changes. Small disc osteophyte complexes partially efface the ventral thecal sac most pronounced at C4-5 where there is mild canal stenosis. Slight uncinate spurring facet hypertrophic changes result in mild bilateral foraminal narrowing C6-7 bilaterally. Upper chest: No acute abnormality in the upper chest or imaged lung apices. Other: None. IMPRESSION: No acute intracranial abnormality. No acute cervical spine fracture or traumatic listhesis. Multilevel cervical spondylitic changes and likely degenerative stepwise retrolisthesis, stable from prior. Debris in the bilateral external auditory canals, correlate for cerumen impaction. Electronically Signed   By: Kreg Shropshire M.D.   On: 03/08/2021 00:56   MR LIVER W WO CONTRAST  Result Date: 03/17/2021 CLINICAL DATA:  Ultrasound demonstrating a hyperechoic left hepatic lobe lesion. Thrombocytopenia in the setting of chronic alcohol use. EXAM: MRI ABDOMEN WITHOUT AND WITH CONTRAST TECHNIQUE: Multiplanar multisequence MR imaging of the abdomen was performed both before and after the administration of intravenous contrast. CONTRAST:  7.67mL GADAVIST GADOBUTROL 1 MMOL/ML IV SOLN COMPARISON:  Ultrasound 03/16/2021.  CT 07/06/2020. FINDINGS: Lower chest: Trace right pleural fluid.   Normal heart size. Hepatobiliary: No evidence of cirrhosis. No suspicious liver lesion or correlate for the ultrasound abnormality. Normal gallbladder, without biliary ductal dilatation. Pancreas:  Normal, without mass or ductal dilatation. Spleen: The spleen measures 14.2 by 7.0 x 5.6 cm (volume = 290 cm^3). Adrenals/Urinary Tract: Normal adrenal glands. Lower pole right renal subcentimeter cyst. Normal left kidney. Stomach/Bowel: Normal stomach and abdominal bowel loops. Vascular/Lymphatic: Normal caliber of the aorta and branch vessels. Patent portal, hepatic, splenic veins. No abdominal adenopathy. Other:  No ascites. Musculoskeletal: No acute osseous abnormality. IMPRESSION: 1. No suspicious liver lesion or correlate for the ultrasound abnormality, which was presumably artifactual or related to MRI occult focal steatosis. 2. No cirrhosis or explanation for thrombocytopenia.  Electronically Signed   By: Jeronimo Greaves M.D.   On: 03/17/2021 13:10   DG Foot Complete Left  Result Date: 03/11/2021 CLINICAL DATA:  Bilateral foot ulcerations. EXAM: LEFT FOOT - COMPLETE 3+ VIEW COMPARISON:  None. FINDINGS: Small plantar and Achilles calcaneal spurs. No acute bony findings. No gas tracking in the soft tissues or foreign body observed. IMPRESSION: 1. Small plantar and Achilles calcaneal spurs. Electronically Signed   By: Gaylyn Rong M.D.   On: 03/11/2021 16:56   DG Foot Complete Right  Result Date: 03/11/2021 CLINICAL DATA:  Bilateral foot ulcerations. EXAM: RIGHT FOOT COMPLETE - 3+ VIEW COMPARISON:  02/24/2021 FINDINGS: Plantar and Achilles calcaneal spurs. Stable faint sclerotic band along the calcaneus, probably incidental. Boehler's angle 31 degrees, within normal limits. Normal alignment at the Lisfranc joint. The metatarsals and phalanges appear intact. No significant midfoot abnormality is observed. No gas in the soft tissues.  No foreign body observed. IMPRESSION: 1. Small plantar Achilles calcaneal  spurs. Electronically Signed   By: Gaylyn Rong M.D.   On: 03/11/2021 16:54   ECHOCARDIOGRAM COMPLETE  Result Date: 03/19/2021    ECHOCARDIOGRAM REPORT   Patient Name:   Gary [name] Date of Exam: 03/19/2021 Medical Rec #:  [id number]       Height:       73.0 in Accession #:    [id number]      Weight:       159.0 lb Date of Birth:  Dec 22, 1985       BSA:          1.951 m Patient Age:    34 years        BP:           91/60 mmHg Patient Gender: M               HR:           56 bpm. Exam Location:  Inpatient Procedure: 2D Echo, Cardiac Doppler and Color Doppler Indications:    Chest pain  History:        Patient has no prior history of Echocardiogram examinations.  Sonographer:    Neomia Dear RDCS Referring Phys: [id number] EMILY B MULLEN IMPRESSIONS  1. Left ventricular ejection fraction, by estimation, is 55 to 60%. The left ventricle has normal function. The left ventricle has no regional wall motion abnormalities. Left ventricular diastolic parameters were normal.  2. Right ventricular systolic function is normal. The right ventricular size is normal. There is normal pulmonary artery systolic pressure. The estimated right ventricular systolic pressure is 26.6 mmHg.  3. Left atrial size was mild to moderately dilated.  4. Right atrial size was mildly dilated.  5. The mitral valve is normal in structure. Trivial mitral valve regurgitation. No evidence of mitral stenosis.  6. The aortic valve is normal in structure. Aortic valve regurgitation is not visualized. No aortic stenosis is present.  7. The inferior vena cava is normal in size with greater than 50% respiratory variability, suggesting right atrial pressure of 3 mmHg.  8. Normal appearing study except for enlarged atria. FINDINGS  Left Ventricle: Left ventricular ejection fraction, by estimation, is 55 to 60%. The left ventricle has normal function. The left ventricle has no regional wall motion abnormalities. The left ventricular internal cavity size was  normal in size. There is  no left ventricular hypertrophy. Left ventricular diastolic parameters were normal. Right Ventricle: The right ventricular size is normal. No increase in right ventricular wall thickness. Right ventricular  systolic function is normal. There is normal pulmonary artery systolic pressure. The tricuspid regurgitant velocity is 2.43 m/s, and  with an assumed right atrial pressure of 3 mmHg, the estimated right ventricular systolic pressure is 26.6 mmHg. Left Atrium: Left atrial size was mild to moderately dilated. Right Atrium: Right atrial size was mildly dilated. Pericardium: There is no evidence of pericardial effusion. Mitral Valve: The mitral valve is normal in structure. Trivial mitral valve regurgitation. No evidence of mitral valve stenosis. Tricuspid Valve: The tricuspid valve is normal in structure. Tricuspid valve regurgitation is trivial. Aortic Valve: The aortic valve is normal in structure. Aortic valve regurgitation is not visualized. No aortic stenosis is present. Aortic valve mean gradient measures 4.0 mmHg. Aortic valve peak gradient measures 8.1 mmHg. Aortic valve area, by VTI measures 3.54 cm. Pulmonic Valve: The pulmonic valve was normal in structure. Pulmonic valve regurgitation is not visualized. Aorta: The aortic root is normal in size and structure. Venous: The inferior vena cava is normal in size with greater than 50% respiratory variability, suggesting right atrial pressure of 3 mmHg. IAS/Shunts: No atrial level shunt detected by color flow Doppler.  LEFT VENTRICLE PLAX 2D LVIDd:         4.70 cm      Diastology LVIDs:         2.90 cm      LV e' medial:    8.70 cm/s LV PW:         0.90 cm      LV E/e' medial:  9.3 LV IVS:        0.90 cm      LV e' lateral:   11.10 cm/s LVOT diam:     2.30 cm      LV E/e' lateral: 7.3 LV SV:         116 LV SV Index:   59 LVOT Area:     4.15 cm  LV Volumes (MOD) LV vol d, MOD A2C: 123.0 ml LV vol d, MOD A4C: 125.0 ml LV vol s, MOD A2C:  37.4 ml LV vol s, MOD A4C: 49.2 ml LV SV MOD A2C:     85.6 ml LV SV MOD A4C:     125.0 ml LV SV MOD BP:      83.7 ml RIGHT VENTRICLE RV S prime:     10.90 cm/s TAPSE (M-mode): 3.2 cm LEFT ATRIUM             Index       RIGHT ATRIUM           Index LA diam:        4.00 cm 2.05 cm/m  RA Area:     22.30 cm LA Vol (A2C):   86.2 ml 44.17 ml/m RA Volume:   72.40 ml  37.10 ml/m LA Vol (A4C):   70.6 ml 36.18 ml/m LA Biplane Vol: 81.6 ml 41.81 ml/m  AORTIC VALVE                   PULMONIC VALVE AV Area (Vmax):    3.80 cm    PV Vmax:       0.95 m/s AV Area (Vmean):   3.58 cm    PV Vmean:      64.300 cm/s AV Area (VTI):     3.54 cm    PV VTI:        0.217 m AV Vmax:           142.00 cm/s PV Peak  grad:  3.6 mmHg AV Vmean:          98.100 cm/s PV Mean grad:  2.0 mmHg AV VTI:            0.327 m AV Peak Grad:      8.1 mmHg AV Mean Grad:      4.0 mmHg LVOT Vmax:         130.00 cm/s LVOT Vmean:        84.500 cm/s LVOT VTI:          0.279 m LVOT/AV VTI ratio: 0.85  AORTA Ao Root diam: 3.30 cm Ao Asc diam:  3.30 cm MITRAL VALVE               TRICUSPID VALVE MV Area (PHT): 3.77 cm    TR Peak grad:   23.6 mmHg MV Decel Time: 201 msec    TR Vmax:        243.00 cm/s MR Peak grad: 78.9 mmHg MR Mean grad: 47.0 mmHg    SHUNTS MR Vmax:      444.00 cm/s  Systemic VTI:  0.28 m MR Vmean:     324.0 cm/s   Systemic Diam: 2.30 cm MV E velocity: 81.00 cm/s MV A velocity: 57.40 cm/s MV E/A ratio:  1.41 Marca Ancona MD Electronically signed by Marca Ancona MD Signature Date/Time: 03/19/2021/11:12:59 AM    Final    US Abdomen Limited RUQ (LIVER/GB)  Result Date: 03/16/2021 CLINICAL DATA:  Transaminitis. EXAM: ULTRASOUND ABDOMEN LIMITED RIGHT UPPER QUADRANT COMPARISON:  CT abdomen pelvis, 06/28/2020. Abdomen ultrasound, 06/13/2020. FINDINGS: Gallbladder: No gallstones or wall thickening visualized. No sonographic Murphy sign noted by sonographer. Common bile duct: Diameter: 3 mm Liver: Area of increased parenchymal echogenicity in the left  lobe spanning approximately 3 x 1.4 x 2.8 cm. This may correspond to a hypoattenuating area along the inferior aspect of segment 4 B on the prior CT suggesting focal fat. Overall echogenicity of the liver is mildly increased. No other liver lesions. No defined masses. Portal vein is patent on color Doppler imaging with normal direction of blood flow towards the liver. Other: None. IMPRESSION: 1. No acute findings.  Normal gallbladder.  No bile duct dilation. 2. Area of hyperechogenicity in the left lobe of the liver, which may reflect a focal area of fat accumulation or a poorly defined meningioma, but is nonspecific. It may correspond to a hypoattenuating area on the prior CT. This was not evident on the previous ultrasound. This finding could be further assessed/characterized with liver MRI without and with contrast. 3. Mild generalized increased liver parenchymal echogenicity consistent with hepatic steatosis, less prominent than on the prior ultrasound. Electronically Signed   By: Amie Portland M.D.   On: 03/16/2021 14:22    EKG None  Radiology No results found.  Procedures Procedures   Medications Ordered in ED Medications - No data to display  ED Course  I have reviewed the triage vital signs and the nursing notes.  Pertinent labs & imaging results that were available during my care of the patient were reviewed by me and considered in my medical decision making (see chart for details).    Observed in the ED. PO challenged.  No SI or HI.  Is not withdrawing at this time.  Stable for discharge with close follow up.    Clinically sober.    Gary [name] was evaluated in Emergency Department on 04/03/2021 for the symptoms described in the history of present illness. He was  evaluated in the context of the global COVID-19 pandemic, which necessitated consideration that the patient might be at risk for infection with the SARS-CoV-2 virus that causes COVID-19. Institutional protocols and  algorithms that pertain to the evaluation of patients at risk for COVID-19 are in a state of rapid change based on information released by regulatory bodies including the CDC and federal and state organizations. These policies and algorithms were followed during the patient's care in the ED.  Final Clinical Impression(s) / ED Diagnoses Return for intractable cough, coughing up blood, fevers >100.4 unrelieved by medication, shortness of breath, intractable vomiting, chest pain, shortness of breath, weakness, numbness, changes in speech, facial asymmetry, abdominal pain, passing out, Inability to tolerate liquids or food, cough, altered mental status or any concerns. No signs of systemic illness or infection. The patient is nontoxic-appearing on exam and vital signs are within normal limits.  I have reviewed the triage vital signs and the nursing notes. Pertinent labs & imaging results that were available during my care of the patient were reviewed by me and considered in my medical decision making (see chart for details). After history, exam, and medical workup I feel the patient has been appropriately medically screened and is safe for discharge home. Pertinent diagnoses were discussed with the patient. Patient was given return precautions.     Zigmund Linse, MD 04/03/21 Jeralyn Bennett

## 2021-04-03 NOTE — ED Notes (Signed)
Pt refused to allow RN vitals and refused discharge paperwork.

## 2021-04-03 NOTE — ED Notes (Signed)
Critical Ethanol 496

## 2021-04-03 NOTE — ED Notes (Signed)
Pt unable to change, was searched and wonded.  Belt, coat and boots were taken.

## 2021-04-03 NOTE — ED Notes (Signed)
Pt denies SI and HI,pt reports being seen for alcohol withdrawals, pt showing visible signs of alcohol intoxication. Pt moved with x2 assist from recliner chair to bed.

## 2021-04-06 ENCOUNTER — Emergency Department (HOSPITAL_COMMUNITY)
Admission: EM | Admit: 2021-04-06 | Discharge: 2021-04-07 | Disposition: A | Discharge status: Home / Self Care | Payer: Self-pay | Attending: Emergency Medicine | Admitting: Emergency Medicine

## 2021-04-06 ENCOUNTER — Encounter (HOSPITAL_COMMUNITY): Payer: Self-pay

## 2021-04-06 ENCOUNTER — Other Ambulatory Visit: Payer: Self-pay

## 2021-04-06 DIAGNOSIS — Z20822 Contact with and (suspected) exposure to covid-19: Secondary | ICD-10-CM | POA: Insufficient documentation

## 2021-04-06 DIAGNOSIS — S61215A Laceration without foreign body of left ring finger without damage to nail, initial encounter: Secondary | ICD-10-CM | POA: Insufficient documentation

## 2021-04-06 DIAGNOSIS — Z21 Asymptomatic human immunodeficiency virus [HIV] infection status: Secondary | ICD-10-CM | POA: Insufficient documentation

## 2021-04-06 DIAGNOSIS — R45850 Homicidal ideations: Secondary | ICD-10-CM | POA: Insufficient documentation

## 2021-04-06 DIAGNOSIS — F1510 Other stimulant abuse, uncomplicated: Secondary | ICD-10-CM | POA: Insufficient documentation

## 2021-04-06 DIAGNOSIS — F10129 Alcohol abuse with intoxication, unspecified: Secondary | ICD-10-CM | POA: Insufficient documentation

## 2021-04-06 DIAGNOSIS — F332 Major depressive disorder, recurrent severe without psychotic features: Secondary | ICD-10-CM | POA: Insufficient documentation

## 2021-04-06 DIAGNOSIS — F32A Depression, unspecified: Secondary | ICD-10-CM

## 2021-04-06 DIAGNOSIS — Y908 Blood alcohol level of 240 mg/100 ml or more: Secondary | ICD-10-CM | POA: Insufficient documentation

## 2021-04-06 DIAGNOSIS — F1290 Cannabis use, unspecified, uncomplicated: Secondary | ICD-10-CM | POA: Insufficient documentation

## 2021-04-06 DIAGNOSIS — Y9301 Activity, walking, marching and hiking: Secondary | ICD-10-CM | POA: Insufficient documentation

## 2021-04-06 DIAGNOSIS — F17210 Nicotine dependence, cigarettes, uncomplicated: Secondary | ICD-10-CM | POA: Insufficient documentation

## 2021-04-06 DIAGNOSIS — F10929 Alcohol use, unspecified with intoxication, unspecified: Secondary | ICD-10-CM

## 2021-04-06 DIAGNOSIS — X58XXXA Exposure to other specified factors, initial encounter: Secondary | ICD-10-CM | POA: Insufficient documentation

## 2021-04-06 DIAGNOSIS — R45851 Suicidal ideations: Secondary | ICD-10-CM | POA: Insufficient documentation

## 2021-04-06 LAB — CBC WITH DIFFERENTIAL/PLATELET
Abs Immature Granulocytes: 0.01 10*3/uL (ref 0.00–0.07)
Basophils Absolute: 0.1 10*3/uL (ref 0.0–0.1)
Basophils Relative: 1 %
Eosinophils Absolute: 0 10*3/uL (ref 0.0–0.5)
Eosinophils Relative: 0 %
HCT: 38.2 % — ABNORMAL LOW (ref 39.0–52.0)
Hemoglobin: 13.2 g/dL (ref 13.0–17.0)
Immature Granulocytes: 0 %
Lymphocytes Relative: 28 %
Lymphs Abs: 1.7 10*3/uL (ref 0.7–4.0)
MCH: 33.6 pg (ref 26.0–34.0)
MCHC: 34.6 g/dL (ref 30.0–36.0)
MCV: 97.2 fL (ref 80.0–100.0)
Monocytes Absolute: 0.4 10*3/uL (ref 0.1–1.0)
Monocytes Relative: 6 %
Neutro Abs: 3.9 10*3/uL (ref 1.7–7.7)
Neutrophils Relative %: 65 %
Platelets: 163 10*3/uL (ref 150–400)
RBC: 3.93 MIL/uL — ABNORMAL LOW (ref 4.22–5.81)
RDW: 13.6 % (ref 11.5–15.5)
WBC: 6 10*3/uL (ref 4.0–10.5)
nRBC: 0 % (ref 0.0–0.2)

## 2021-04-06 LAB — ETHANOL: Alcohol, Ethyl (B): 419 mg/dL (ref <10)

## 2021-04-06 LAB — COMPREHENSIVE METABOLIC PANEL
ALT: 32 U/L (ref 0–44)
AST: 53 U/L — ABNORMAL HIGH (ref 15–41)
Albumin: 4.5 g/dL (ref 3.5–5.0)
Alkaline Phosphatase: 49 U/L (ref 38–126)
Anion gap: 15 (ref 5–15)
BUN: 12 mg/dL (ref 6–20)
CO2: 23 mmol/L (ref 22–32)
Calcium: 9 mg/dL (ref 8.9–10.3)
Chloride: 103 mmol/L (ref 98–111)
Creatinine, Ser: 0.86 mg/dL (ref 0.61–1.24)
GFR, Estimated: >60 mL/min (ref >60)
Glucose, Bld: 176 mg/dL — ABNORMAL HIGH (ref 70–99)
Potassium: 3.9 mmol/L (ref 3.5–5.1)
Sodium: 141 mmol/L (ref 135–145)
Total Bilirubin: 0.7 mg/dL (ref 0.3–1.2)
Total Protein: 8 g/dL (ref 6.5–8.1)

## 2021-04-06 LAB — RESP PANEL BY RT-PCR (FLU A&B, COVID) ARPGX2
Influenza A by PCR: NEGATIVE
Influenza B by PCR: NEGATIVE
SARS Coronavirus 2 by RT PCR: NEGATIVE

## 2021-04-06 MED ORDER — LORAZEPAM 2 MG/ML IJ SOLN
1.0000 mg | Freq: Once | INTRAMUSCULAR | Status: AC
  Administered 2021-04-06: 1 mg via INTRAVENOUS
  Filled 2021-04-06: qty 1

## 2021-04-06 MED ORDER — SODIUM CHLORIDE 0.9 % IV SOLN
1000.0000 mL | INTRAVENOUS | Status: DC
  Administered 2021-04-06: 1000 mL via INTRAVENOUS

## 2021-04-06 MED ORDER — THIAMINE HCL 100 MG/ML IJ SOLN
100.0000 mg | Freq: Once | INTRAMUSCULAR | Status: AC
  Administered 2021-04-06: 100 mg via INTRAVENOUS
  Filled 2021-04-06: qty 2

## 2021-04-06 MED ORDER — BACITRACIN ZINC 500 UNIT/GM EX OINT
1.0000 "application " | TOPICAL_OINTMENT | Freq: Once | CUTANEOUS | Status: AC
  Administered 2021-04-06: 1 via TOPICAL
  Filled 2021-04-06: qty 0.9

## 2021-04-06 MED ORDER — SODIUM CHLORIDE 0.9 % IV BOLUS (SEPSIS)
1000.0000 mL | Freq: Once | INTRAVENOUS | Status: AC
  Administered 2021-04-06: 1000 mL via INTRAVENOUS

## 2021-04-06 MED ORDER — ELVITEG-COBIC-EMTRICIT-TENOFAF 150-150-200-10 MG PO TABS
1.0000 | ORAL_TABLET | Freq: Every day | ORAL | Status: DC
  Administered 2021-04-07: 1 via ORAL
  Filled 2021-04-06: qty 1

## 2021-04-06 MED ORDER — FOLIC ACID 1 MG PO TABS
1.0000 mg | ORAL_TABLET | Freq: Once | ORAL | Status: AC
  Administered 2021-04-06: 1 mg via ORAL
  Filled 2021-04-06: qty 1

## 2021-04-06 NOTE — ED Notes (Signed)
Wound to left hand cleaned, bacitracin applied, and dressing applied

## 2021-04-06 NOTE — ED Provider Notes (Signed)
[facility]-EMERGENCY DEPT Provider Note   CSN: [id number] Arrival date & time: 04/06/21  1955     History Chief Complaint  Patient presents with  . Homicidal    SI/HI    Gary [name] is a 35 y.o. male.  HPI   Patient has a history of recurrent alcohol abuse and frequent visits to the ED.  Patient also has history of depression, suicidal ideation and HIV disease.  Patient has been to the ED 31 times in the last 6 months.  Patient walked into a gas station this evening telling people that he needed to be cut off because he felt like he was going to hurt other people or himself.  He did admit to drinking a lot of alcohol today.  Patient plan to try to drink so much that he would never wake up again.  Patient states he is going through a mental crisis right now.  He feels like he might harm someone or himself.  Patient requests restraints  Past Medical History:  Diagnosis Date  . Alcohol abuse   . HIV (human immunodeficiency virus infection) (HCC)   . Subdural hematoma [facility])     Patient Active Problem List   Diagnosis Date Noted  . Alcohol withdrawal seizure without complication (HCC)   . 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 Elevated troponin 10/19/2020  . Alcohol withdrawal (HCC) 10/19/2020  . Alcohol abuse   . Suicidal ideation 06/14/2020  . Pancreatitis 06/13/2020  . 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  . Malnutrition of moderate degree 05/07/2020  . Gastrointestinal hemorrhage   . Alcoholic hepatitis without ascites   . Melena 05/05/2020  . Alcoholic ketoacidosis 05/05/2020  . Thrombocytopenia (HCC) 07/25/2019  . Transaminitis 07/25/2019  . Traumatic subdural hematoma (HCC) 07/02/2019  . Alcohol abuse with intoxication (HCC) 07/02/2019  . 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 Alcohol use disorder, severe, dependence (HCC) 04/16/2019  . Major depressive disorder, recurrent severe without psychotic features (HCC)  04/16/2019  . HIV disease (HCC) 06/16/2018  . Polysubstance abuse (HCC) 06/16/2018  . Cigarette smoker 06/16/2018    Past Surgical History:  Procedure Laterality Date  . INCISION AND DRAINAGE PERIRECTAL ABSCESS N/A 09/24/2016   Procedure: IRRIGATION AND DEBRIDEMENT PERIRECTAL ABSCESS;  Surgeon: Ricarda Frame, MD;  Location: [facility] ORS;  Service: General;  Laterality: N/A;  . none         Family History  Problem Relation Age of Onset  . Alcohol abuse Mother   . Alcohol abuse Father     Social History   Tobacco Use  . Smoking status: Current Every Day Smoker    Packs/day: 0.50    Types: Cigarettes  . Smokeless tobacco: Never Used  . Tobacco comment: unable to smoke while incarcerated 6+ months 02/13/20  Vaping Use  . Vaping Use: Never used  Substance Use Topics  . Alcohol use: Yes    Comment: "I drink all I can"  . Drug use: Not Currently    Types: Marijuana, Methamphetamines    Comment: last used 04/10/2019    Home Medications Prior to Admission medications   Medication Sig Start Date End Date Taking? Authorizing Provider  elvitegravir-cobicistat-emtricitabine-tenofovir (GENVOYA) 150-150-200-10 MG TABS tablet Take 1 tablet by mouth daily with breakfast. 03/19/21   Eliezer Bottom, MD  famotidine (PEPCID) 20 MG tablet Take 1 tablet (20 mg total) by mouth 2 (two) times daily. Patient not taking: Reported on 06/01/2019 05/14/19 06/02/19  Benjiman Core, MD    Allergies  Tegretol [carbamazepine] and Caffeine  Review of Systems   Review of Systems  All other systems reviewed and are negative.   Physical Exam Updated Vital Signs BP 109/73   Pulse 78   Temp 98.6 F (37 C) (Oral)   Resp 20   SpO2 98%   Physical Exam Vitals and nursing note reviewed.  Constitutional:      General: He is not in acute distress.    Appearance: He is well-developed.  HENT:     Head: Normocephalic and atraumatic.     Right Ear: External ear normal.     Left Ear: External ear normal.   Eyes:     General: No scleral icterus.       Right eye: No discharge.        Left eye: No discharge.     Conjunctiva/sclera: Conjunctivae normal.  Neck:     Trachea: No tracheal deviation.  Cardiovascular:     Rate and Rhythm: Normal rate and regular rhythm.  Pulmonary:     Effort: Pulmonary effort is normal. No respiratory distress.     Breath sounds: Normal breath sounds. No stridor. No wheezing or rales.  Abdominal:     General: Bowel sounds are normal. There is no distension.     Palpations: Abdomen is soft.     Tenderness: There is no abdominal tenderness. There is no guarding or rebound.  Musculoskeletal:     Cervical back: Neck supple.     Comments: Abrasion avulsion type laceration to finger pad, left ring finger, epidermal layer sloughed off  Skin:    General: Skin is warm and dry.     Findings: No rash.  Neurological:     Mental Status: He is alert.     Cranial Nerves: No cranial nerve deficit (no facial droop, extraocular movements intact, no slurred speech).     Sensory: No sensory deficit.     Motor: No abnormal muscle tone or seizure activity.     Coordination: Coordination normal.     Comments: Speech slurred, appears intoxicated  Psychiatric:        Mood and Affect: Mood is depressed. Affect is labile and angry.        Behavior: Behavior is aggressive.        Thought Content: Thought content includes homicidal and suicidal ideation.        Judgment: Judgment is impulsive.     ED Results / Procedures / Treatments   Labs (all labs ordered are listed, but only abnormal results are displayed) Labs Reviewed  COMPREHENSIVE METABOLIC PANEL - Abnormal; Notable for the following components:      Result Value   Glucose, Bld 176 (*)    AST 53 (*)    All other components within normal limits  ETHANOL - Abnormal; Notable for the following components:   Alcohol, Ethyl (B) 419 (*)    All other components within normal limits  CBC WITH DIFFERENTIAL/PLATELET -  Abnormal; Notable for the following components:   RBC 3.93 (*)    HCT 38.2 (*)    All other components within normal limits  RESP PANEL BY RT-PCR (FLU A&B, COVID) ARPGX2  RAPID URINE DRUG SCREEN, HOSP PERFORMED    EKG EKG Interpretation  Date/Time:  Sunday Apr 06 2021 21:17:49 EDT Ventricular Rate:  88 PR Interval:  149 QRS Duration: 99 QT Interval:  392 QTC Calculation: 475 R Axis:   76 Text Interpretation: Sinus rhythm Borderline prolonged QT interval Since last tracing rate faster Confirmed  by Linwood Dibbles [phone]) on 04/06/2021 9:29:05 PM   Radiology No results found.  Procedures Procedures   Medications Ordered in ED Medications  sodium chloride 0.9 % bolus 1,000 mL (0 mLs Intravenous Stopped 04/06/21 2252)    Followed by  0.9 %  sodium chloride infusion (1,000 mLs Intravenous New Bag/Given 04/06/21 2124)  bacitracin ointment 1 application (has no administration in time range)  thiamine (B-1) injection 100 mg (has no administration in time range)  folic acid (FOLVITE) tablet 1 mg (has no administration in time range)  elvitegravir-cobicistat-emtricitabine-tenofovir (GENVOYA) 150-150-200-10 MG tablet 1 tablet (has no administration in time range)  LORazepam (ATIVAN) injection 1 mg (1 mg Intravenous Given 04/06/21 2119)    ED Course  I have reviewed the triage vital signs and the nursing notes.  Pertinent labs & imaging results that were available during my care of the patient were reviewed by me and considered in my medical decision making (see chart for details).  Clinical Course as of 04/06/21 2256  Wynelle Link Apr 06, 2021  2134 Patient is requesting to be restrained right now.  Will order restraints and a dose of Ativan.  Hopefully patient will allow [place] to remove his restraints soon [JK]  2251 Metabolic panel unremarkable.  CBC unremarkable.  COVID-negative.  Alcohol level severely elevated at 419 [JK]    Clinical Course User Index [JK] Linwood Dibbles, MD   MDM  Rules/Calculators/A&P                          Patient presents with acute behavioral disturbance in the setting of severe alcohol intoxication.  Patient states he is having an acute mental crisis.  He has actually requested to be placed in restraints.  No signs of any other acute medical condition at this time.  Patient has been given thiamine and folic acid.  We will continue to monitor him until he sobers up.  At that time we will need to reassess whether he is still depressed and suicidal require psychiatric consultation  The patient has been placed in psychiatric observation due to the need to provide a safe environment for the patient while obtaining psychiatric consultation and evaluation, as well as ongoing medical and medication management to treat the patient's condition.  The patient has not been placed under full IVC at this time.  Final Clinical Impression(s) / ED Diagnoses Final diagnoses:  Alcoholic intoxication with complication (HCC)  Depression, unspecified depression type      Linwood Dibbles, MD 04/06/21 2256

## 2021-04-06 NOTE — ED Triage Notes (Addendum)
Pt arrived via ems in 4 point restraints accompanied by police.  Police arrived to scene first where patient had walked into a gas station and stated he wanted to be cuffed because he feels he wants to hurt people and himself.  Pt reports he has been drinking a lot today and says his plan to harm himself is by drinking so much that he will not wake up.  Pt reports if he is not kept in restraints he will harm people.  Pt has hx of ptsd and anxiety.  A&ox4. EMS reported left hand laceration along with left shoulder pain.

## 2021-04-06 NOTE — ED Notes (Signed)
Pt unable to sign MSE due to restraints in place and violence

## 2021-04-07 ENCOUNTER — Ambulatory Visit (HOSPITAL_COMMUNITY)
Admission: EM | Admit: 2021-04-07 | Discharge: 2021-04-08 | Disposition: A | Discharge status: Home / Self Care | Payer: No Payment, Other | Attending: Nurse Practitioner | Admitting: Nurse Practitioner

## 2021-04-07 ENCOUNTER — Emergency Department (HOSPITAL_COMMUNITY)
Admission: EM | Admit: 2021-04-07 | Discharge: 2021-04-07 | Disposition: A | Discharge status: Home / Self Care | Payer: Self-pay | Attending: Emergency Medicine | Admitting: Emergency Medicine

## 2021-04-07 ENCOUNTER — Other Ambulatory Visit: Payer: Self-pay

## 2021-04-07 ENCOUNTER — Encounter (HOSPITAL_COMMUNITY): Payer: Self-pay

## 2021-04-07 DIAGNOSIS — Z560 Unemployment, unspecified: Secondary | ICD-10-CM | POA: Insufficient documentation

## 2021-04-07 DIAGNOSIS — R739 Hyperglycemia, unspecified: Secondary | ICD-10-CM | POA: Insufficient documentation

## 2021-04-07 DIAGNOSIS — Z5902 Unsheltered homelessness: Secondary | ICD-10-CM | POA: Insufficient documentation

## 2021-04-07 DIAGNOSIS — Z21 Asymptomatic human immunodeficiency virus [HIV] infection status: Secondary | ICD-10-CM | POA: Insufficient documentation

## 2021-04-07 DIAGNOSIS — R45850 Homicidal ideations: Secondary | ICD-10-CM

## 2021-04-07 DIAGNOSIS — F10129 Alcohol abuse with intoxication, unspecified: Secondary | ICD-10-CM | POA: Insufficient documentation

## 2021-04-07 DIAGNOSIS — F10920 Alcohol use, unspecified with intoxication, uncomplicated: Secondary | ICD-10-CM

## 2021-04-07 DIAGNOSIS — F17210 Nicotine dependence, cigarettes, uncomplicated: Secondary | ICD-10-CM | POA: Insufficient documentation

## 2021-04-07 DIAGNOSIS — Z20822 Contact with and (suspected) exposure to covid-19: Secondary | ICD-10-CM | POA: Insufficient documentation

## 2021-04-07 DIAGNOSIS — Z79899 Other long term (current) drug therapy: Secondary | ICD-10-CM | POA: Insufficient documentation

## 2021-04-07 DIAGNOSIS — F10229 Alcohol dependence with intoxication, unspecified: Secondary | ICD-10-CM

## 2021-04-07 LAB — POCT URINE DRUG SCREEN - MANUAL ENTRY (I-SCREEN)
POC Amphetamine UR: NOT DETECTED
POC Buprenorphine (BUP): NOT DETECTED
POC Cocaine UR: NOT DETECTED
POC Marijuana UR: NOT DETECTED
POC Methadone UR: NOT DETECTED
POC Methamphetamine UR: NOT DETECTED
POC Morphine: NOT DETECTED
POC Oxazepam (BZO): POSITIVE — AB
POC Oxycodone UR: NOT DETECTED
POC Secobarbital (BAR): NOT DETECTED

## 2021-04-07 LAB — CBG MONITORING, ED: Glucose-Capillary: 217 mg/dL — ABNORMAL HIGH (ref 70–99)

## 2021-04-07 LAB — POC SARS CORONAVIRUS 2 AG: SARSCOV2ONAVIRUS 2 AG: NEGATIVE

## 2021-04-07 MED ORDER — LORAZEPAM 1 MG PO TABS
2.0000 mg | ORAL_TABLET | Freq: Once | ORAL | Status: AC
  Administered 2021-04-07: 2 mg via ORAL
  Filled 2021-04-07: qty 2

## 2021-04-07 MED ORDER — LORAZEPAM 1 MG PO TABS: 1.0000 mg | ORAL_TABLET | Freq: Every day | ORAL | Status: DC

## 2021-04-07 MED ORDER — LORAZEPAM 1 MG PO TABS: 1.0000 mg | ORAL_TABLET | Freq: Three times a day (TID) | ORAL | Status: DC

## 2021-04-07 MED ORDER — ALUM & MAG HYDROXIDE-SIMETH 200-200-20 MG/5ML PO SUSP: 30.0000 mL | ORAL | Status: DC | PRN

## 2021-04-07 MED ORDER — CHLORDIAZEPOXIDE HCL 25 MG PO CAPS
100.0000 mg | ORAL_CAPSULE | Freq: Once | ORAL | Status: AC
  Administered 2021-04-07: 100 mg via ORAL
  Filled 2021-04-07: qty 4

## 2021-04-07 MED ORDER — LORAZEPAM 1 MG PO TABS: 1.0000 mg | ORAL_TABLET | Freq: Four times a day (QID) | ORAL | Status: DC | PRN

## 2021-04-07 MED ORDER — TRAZODONE HCL 50 MG PO TABS: 50.0000 mg | ORAL_TABLET | Freq: Every evening | ORAL | Status: DC | PRN

## 2021-04-07 MED ORDER — LORAZEPAM 2 MG/ML IJ SOLN: 2.0000 mg | Freq: Once | INTRAMUSCULAR | Status: DC

## 2021-04-07 MED ORDER — ONDANSETRON 4 MG PO TBDP: 4.0000 mg | ORAL_TABLET | Freq: Four times a day (QID) | ORAL | Status: DC | PRN

## 2021-04-07 MED ORDER — THIAMINE HCL 100 MG PO TABS
100.0000 mg | ORAL_TABLET | Freq: Every day | ORAL | Status: DC
  Administered 2021-04-08: 100 mg via ORAL
  Filled 2021-04-07: qty 1

## 2021-04-07 MED ORDER — LORAZEPAM 1 MG PO TABS
1.0000 mg | ORAL_TABLET | Freq: Four times a day (QID) | ORAL | Status: AC
  Administered 2021-04-08 (×2): 1 mg via ORAL
  Filled 2021-04-07 (×2): qty 1

## 2021-04-07 MED ORDER — ADULT MULTIVITAMIN W/MINERALS CH
1.0000 | ORAL_TABLET | Freq: Every day | ORAL | Status: DC
  Administered 2021-04-07 – 2021-04-08 (×2): 1 via ORAL
  Filled 2021-04-07 (×2): qty 1

## 2021-04-07 MED ORDER — LORAZEPAM 1 MG PO TABS: 1.0000 mg | ORAL_TABLET | Freq: Two times a day (BID) | ORAL | Status: DC

## 2021-04-07 MED ORDER — THIAMINE HCL 100 MG/ML IJ SOLN: 100.0000 mg | Freq: Once | INTRAMUSCULAR | Status: DC

## 2021-04-07 MED ORDER — LORAZEPAM 1 MG PO TABS: 1.0000 mg | ORAL_TABLET | Freq: Four times a day (QID) | ORAL | Status: DC

## 2021-04-07 MED ORDER — HYDROXYZINE HCL 25 MG PO TABS: 25.0000 mg | ORAL_TABLET | Freq: Four times a day (QID) | ORAL | Status: DC | PRN

## 2021-04-07 MED ORDER — MAGNESIUM HYDROXIDE 400 MG/5ML PO SUSP: 30.0000 mL | Freq: Every day | ORAL | Status: DC | PRN

## 2021-04-07 MED ORDER — CHLORDIAZEPOXIDE HCL 25 MG PO CAPS: ORAL_CAPSULE | ORAL | 0 refills | Status: DC

## 2021-04-07 MED ORDER — LOPERAMIDE HCL 2 MG PO CAPS: 2.0000 mg | ORAL_CAPSULE | ORAL | Status: DC | PRN

## 2021-04-07 NOTE — ED Provider Notes (Signed)
[facility]-EMERGENCY DEPT Provider Note   CSN: [id number] Arrival date & time: 04/07/21  1108     History Chief Complaint  Patient presents with  . Alcohol Intoxication    Gary [name] is a 35 y.o. male.  35 year old male with history of substance abuse who presents after drinking 2 bottles of wine.  Patient states that he was not trying to harm himself.  No homicidal ideations.  He had just been seen several hours before and has been discharged.  Denies any other complaints at this time        Past Medical History:  Diagnosis Date  . Alcohol abuse   . HIV (human immunodeficiency virus infection) (HCC)   . Subdural hematoma [facility])     Patient Active Problem List   Diagnosis Date Noted  . Alcohol withdrawal seizure without complication (HCC)   . 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 Elevated troponin 10/19/2020  . Alcohol withdrawal (HCC) 10/19/2020  . Alcohol abuse   . Suicidal ideation 06/14/2020  . Pancreatitis 06/13/2020  . 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  . Malnutrition of moderate degree 05/07/2020  . Gastrointestinal hemorrhage   . Alcoholic hepatitis without ascites   . Melena 05/05/2020  . Alcoholic ketoacidosis 05/05/2020  . Thrombocytopenia (HCC) 07/25/2019  . Transaminitis 07/25/2019  . Traumatic subdural hematoma (HCC) 07/02/2019  . Alcohol abuse with intoxication (HCC) 07/02/2019  . 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 Alcohol use disorder, severe, dependence (HCC) 04/16/2019  . 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 HIV disease (HCC) 06/16/2018  . Polysubstance abuse (HCC) 06/16/2018  . Cigarette smoker 06/16/2018    Past Surgical History:  Procedure Laterality Date  . INCISION AND DRAINAGE PERIRECTAL ABSCESS N/A 09/24/2016   Procedure: IRRIGATION AND DEBRIDEMENT PERIRECTAL ABSCESS;  Surgeon: Ricarda Frame, MD;  Location: [facility] ORS;   Service: General;  Laterality: N/A;  . none         Family History  Problem Relation Age of Onset  . Alcohol abuse Mother   . Alcohol abuse Father     Social History   Tobacco Use  . Smoking status: Current Every Day Smoker    Packs/day: 0.50    Types: Cigarettes  . Smokeless tobacco: Never Used  . Tobacco comment: unable to smoke while incarcerated 6+ months 02/13/20  Vaping Use  . Vaping Use: Never used  Substance Use Topics  . Alcohol use: Yes    Comment: "I drink all I can"  . Drug use: Not Currently    Types: Marijuana, Methamphetamines    Comment: last used 04/10/2019    Home Medications Prior to Admission medications   Medication Sig Start Date End Date Taking? Authorizing Provider  chlordiazePOXIDE (LIBRIUM) 25 MG capsule 50mg  PO TID x 1D, then 25-50mg  PO BID X 1D, then 25-50mg  PO QD X 1D 04/07/21   04/09/21, DO  elvitegravir-cobicistat-emtricitabine-tenofovir (GENVOYA) 150-150-200-10 MG TABS tablet Take 1 tablet by mouth daily with breakfast. 03/19/21   05/19/21, MD  famotidine (PEPCID) 20 MG tablet Take 1 tablet (20 mg total) by mouth 2 (two) times daily. Patient not taking: Reported on 06/01/2019 05/14/19 06/02/19  06/04/19, MD    Allergies    Tegretol [carbamazepine] and Caffeine  Review of Systems   Review of Systems  Unable to perform ROS: Mental status change    Physical Exam Updated Vital Signs There were no vitals taken for this visit.  Physical Exam Vitals and  nursing note reviewed.  Constitutional:      General: He is not in acute distress.    Appearance: Normal appearance. He is well-developed. He is not toxic-appearing.  HENT:     Head: Normocephalic and atraumatic.  Eyes:     General: Lids are normal.     Conjunctiva/sclera: Conjunctivae normal.     Pupils: Pupils are equal, round, and reactive to light.  Neck:     Thyroid: No thyroid mass.     Trachea: No tracheal deviation.  Cardiovascular:     Rate and Rhythm: Normal rate  and regular rhythm.     Heart sounds: Normal heart sounds. No murmur heard. No gallop.   Pulmonary:     Effort: Pulmonary effort is normal. No respiratory distress.     Breath sounds: Normal breath sounds. No stridor. No decreased breath sounds, wheezing, rhonchi or rales.  Abdominal:     General: Bowel sounds are normal. There is no distension.     Palpations: Abdomen is soft.     Tenderness: There is no abdominal tenderness. There is no rebound.  Musculoskeletal:        General: No tenderness. Normal range of motion.     Cervical back: Normal range of motion and neck supple.  Skin:    General: Skin is warm and dry.     Findings: No abrasion or rash.  Neurological:     Mental Status: He is oriented to person, place, and time. He is lethargic.     GCS: GCS eye subscore is 4. GCS verbal subscore is 5. GCS motor subscore is 6.     Cranial Nerves: No cranial nerve deficit.     Sensory: No sensory deficit.  Psychiatric:        Attention and Perception: He is inattentive.        Mood and Affect: Affect is flat.     ED Results / Procedures / Treatments   Labs (all labs ordered are listed, but only abnormal results are displayed) Labs Reviewed  CBG MONITORING, ED    EKG None  Radiology No results found.  Procedures Procedures   Medications Ordered in ED Medications - No data to display  ED Course  I have reviewed the triage vital signs and the nursing notes.  Pertinent labs & imaging results that were available during my care of the patient were reviewed by me and considered in my medical decision making (see chart for details).    MDM Rules/Calculators/A&P                          Blood sugars 217.  Patient kept until clinically sober.  He is stable for discharge Final Clinical Impression(s) / ED Diagnoses Final diagnoses:  None    Rx / DC Orders ED Discharge Orders    None       Lorre Nick, MD 04/07/21 1446

## 2021-04-07 NOTE — Discharge Instructions (Signed)
Please return for worsening symptoms.  Follow up with your doctor.

## 2021-04-07 NOTE — BH Assessment (Addendum)
Gary [name] is a 35 year old male presenting voluntary to [facility] due to SI with plan to drink himself to death. Patient brought in by law enforcement. Patient seen approx 32 times in the ED in the last 6 months. Patient reported drinking 2 bottles of wine today. Patient reported after being discharged from ED earlier today, he was told to come back if he felt bad, patient reported "I feel bad" and then shared SI with plan. Patient is currently suicidal. Per chart, patient BAL was 419 on 04/06/21. Per chart, patient was seen on 04/06/21, see below. Patient was then discharged on 04/07/21 at approx 7am, then returned to [facility] at approx 1122am and discharged shortly once he was stable. Patient reported worsening depressive symptoms. Patient reported hearing voices in his head, however no details disclosed. Patient reported being homeless. Patient reported no support system. Patient denied receiving any outpatient mental health services. When asked if on psych medications, patient reported only being on HIV medications. Patient denied access go guns. Patient was cooperative during assessment. Emergent

## 2021-04-07 NOTE — BH Assessment (Addendum)
Comprehensive Clinical Assessment (CCA) Note  04/07/2021 Gary [name] [id number]   Disposition: Gary ConnJason Berry, NP, recommends overnight observation for safety and stabilization with psych reassessment in the AM.   The patient demonstrates the following risk factors for suicide: Chronic risk factors for suicide include: psychiatric disorder of major depressive disorder, substance use disorder, previous suicide attempts 2x and previous self-harm alcoholism. Acute risk factors for suicide include: unemployment and social withdrawal/isolation. Protective factors for this patient include: uta. Considering these factors, the overall suicide risk at this point appears to be high. Patient is not appropriate for outpatient follow up.  Flowsheet Row ED [facility]2021 [facility]  C-SSRS RISK CATEGORY Error: Q3, 4, or 5 should not be populated when Q2 is No Error: Q3, 4, or 5 should not be populated when Q2 is No High Risk    1:1 risk  Gary [name] is a 35 year old male [facility] SI with plan to drink himself to death. Patient brought in by law enforcement. Patient seen approx 32 times in the ED in the last 6 months. Patient reported drinking 2 bottles of wine today. Patient reported after being discharged from ED earlier today, he was told to come back if he felt bad, patient reported "I feel bad" and then shared SI with plan. Patient is currently suicidal. Per chart, patient BAL was 419 on 04/06/21. Per chart, patient was seen on 04/06/21, see below. Patient was then discharged on 04/07/21 at approx 7am, then [facility] and discharged shortly once he was stable. Patient reported worsening depressive symptoms. Patient reported hearing voices in his head, however no details disclosed. Patient  reported being homeless. Patient reported no support system. Patient denied receiving any outpatient mental health services. When asked if on psych medications, patient reported only being on HIV medications. Patient denied access go guns. Patient was cooperative during assessment. Emergent  Per Triage Note 04/06/21 Pt arrived via ems in 4 point restraints accompanied by police.  Police arrived to scene first where patient had walked into a gas station and stated he wanted to be cuffed because he feels he wants to hurt people and himself.  Pt reports he has been drinking a lot today and says his plan to harm himself is by drinking so much that he will not wake up.  Pt reports if he is not kept in restraints he will harm people.  Pt has hx of ptsd and anxiety.  A&ox4. EMS reported left hand laceration along with left shoulder pain.   Chief Complaint:  Chief Complaint  Patient presents with  . Urgent Emergent Evaluation   Visit Diagnosis:  Alcohol Dependence Major depressive disorder  CCA Screening, Triage and Referral (STR)  Patient Reported Information How did you hear about us? Self  Referral name: No data recorded Referral phone number: No data recorded  Whom do you see for routine medical problems? I don't have a doctor  Practice/Facility Name: No data recorded Practice/Facility Phone Number: No data recorded Name of Contact: No data recorded Contact Number: No data recorded Contact Fax Number: No data recorded Prescriber Name: No data recorded Prescriber Address (if known): No data recorded  What Is the Reason for Your Visit/Call Today? SI with plan to drink self to death.  How Long Has This Been Causing You Problems? <Week  What Do You Feel Would Help You the Most Today? Treatment for Depression or other mood problem  Have You Recently Been in Any Inpatient Treatment (Hospital/Detox/Crisis Center/28-Day Program)? No  Name/Location of Program/Hospital:No data recorded How  Long Were You There? No data recorded When Were You Discharged? No data recorded  Have You Ever Received Services From [facility] Before? Yes  Who Do You See at [facility]? Pt has been assessed by TTS in the past for similar presentation  Have You Recently Had Any Thoughts About Hurting Yourself? Yes  Are You Planning to Commit Suicide/Harm Yourself At This time? Yes  Have you Recently Had Thoughts About Hurting Someone Gary [name]? Yes  Explanation: I violence in my head.  Have You Used Any Alcohol or Drugs in the Past 24 Hours? Yes  How Long Ago Did You Use Drugs or Alcohol? 0100  What Did You Use and How Much? Bottle of alcohol  Do You Currently Have a Therapist/Psychiatrist? No  Name of Therapist/Psychiatrist: No data recorded  Have You Been Recently Discharged From Any Office Practice or Programs? No  Explanation of Discharge From Practice/Program: No data recorded  CCA Biopsychosocial Intake/Chief Complaint:  SI with plan to drink self to death.  Current Symptoms/Problems: Worsening depressive symtpoms.  Patient Reported Schizophrenia/Schizoaffective Diagnosis in Past: No  Strengths: None reported  Preferences: None reported  Abilities: None reported  Type of Services Patient Feels are Needed: None reported  Initial Clinical Notes/Concerns: None  Mental Health Symptoms Depression:  Change in energy/activity; Fatigue; Hopelessness; Increase/decrease in appetite; Sleep (too much or little); Tearfulness; Worthlessness   Duration of Depressive symptoms: Greater than two weeks   Mania:  None   Anxiety:   Fatigue   Psychosis:  Hallucinations   Duration of Psychotic symptoms: Less than six months   Trauma:  None   Obsessions:  None   Compulsions:  None   Inattention:  None   Hyperactivity/Impulsivity:  N/A   Oppositional/Defiant Behaviors:  None   Emotional Irregularity:  Chronic feelings of emptiness   Other Mood/Personality Symptoms:  No data  recorded   Mental Status Exam Appearance and self-care  Stature:  Average   Weight:  Thin   Clothing:  Disheveled   Grooming:  Neglected   Cosmetic use:  None   Posture/gait:  Slumped   Motor activity:  Slowed   Sensorium  Attention:  Normal   Concentration:  Normal   Orientation:  Person; Place; Situation; Time; Object   Recall/memory:  Normal   Affect and Mood  Affect:  Depressed; Flat   Mood:  Depressed; Hopeless; Worthless   Relating  Eye contact:  Avoided   Facial expression:  Depressed; Sad   Attitude toward examiner:  Cooperative   Thought and Language  Speech flow: Slurred; Slow   Thought content:  Appropriate to Mood and Circumstances   Preoccupation:  Suicide; Homicidal   Hallucinations:  None   Organization:  No data recorded  [employer] of Knowledge:  Fair   Intelligence:  Average   Abstraction:  Normal   Judgement:  Poor   Reality Testing:  Adequate   Insight:  Lacking; Poor   Decision Making:  Impulsive   Social Functioning  Social Maturity:  Isolates   Social Judgement:  Impropriety   Stress  Stressors:  Housing; Office manager Ability:  Exhausted   Skill Deficits:  None   Supports:  Support needed    Religion: Religion/Spirituality Are You A Religious Person?: No  Leisure/Recreation: Leisure / Recreation Do You Have Hobbies?: No  Exercise/Diet: Exercise/Diet Do You Exercise?: No Have You Gained or Lost A Significant Amount of Weight in the Past Six Months?: No Do You Follow a Special Diet?: No Do You Have Any Trouble Sleeping?: Yes  CCA Employment/Education Employment/Work Situation: Employment / Work Situation Employment situation: Unemployed Patient's job has been impacted by current illness: Yes What is the longest time patient has a held a job?: Unknown Where was the patient employed at that time?: Unknown Has patient ever been in the [employer]?: No  Education: Education Last  Grade Completed: 12 [employer] of [employer] School: [employer] Did Garment/textile technologist From [employer]?: Yes Did Theme park manager?: No Did Designer, television/film set?: No Did You Have Any Special Interests In School?:  (UTA) Did You Have An Individualized Education Program (IIEP): No Did You Have Any Difficulty At School?: No  CCA Family/Childhood History Family and Relationship History: Family history What is your sexual orientation?: Homosexual Has your sexual activity been affected by drugs, alcohol, medication, or emotional stress?: Unknown Does patient have children?: No  Childhood History:  Childhood History By whom was/is the patient raised?: Mother,Father Additional childhood history information: parents divorced Description of patient's relationship with caregiver when they were a child: patient was relative close to his parents growing up How were you disciplined when you got in trouble as a child/adolescent?: None reported Did patient suffer any verbal/emotional/physical/sexual abuse as a child?: No Has patient ever been sexually abused/assaulted/raped as an adolescent or adult?: No Witnessed domestic violence?: No Has patient been affected by domestic violence as an adult?: No  Child/Adolescent Assessment:   CCA Substance Use Alcohol/Drug Use: Alcohol / Drug Use Pain Medications: See MAR Prescriptions: See MAR Over the Counter: See MAR History of alcohol / drug use?: Yes Longest period of sobriety (when/how long): Unable to quantify Negative Consequences of Use: Financial,Personal relationships,Work / School Withdrawal Symptoms: Blackouts,Change in blood pressure,Irritability,Nausea / Vomiting,Patient aware of relationship between substance abuse and physical/medical complications,Seizures,Sweats Onset of Seizures: during withdrawals Date of most recent seizure: [place]   ASAM's:  Six Dimensions of Multidimensional Assessment  Dimension 1:  Acute Intoxication and/or  Withdrawal Potential:   Dimension 1:  Description of individual's past and current experiences of substance use and withdrawal: Patient has a history of withdrawal seizures  Dimension 2:  Biomedical Conditions and Complications:   Dimension 2:  Description of patient's biomedical conditions and  complications: Patient has several medical issues negatively impacted by his use of alcohol  Dimension 3:  Emotional, Behavioral, or Cognitive Conditions and Complications:  Dimension 3:  Description of emotional, behavioral, or cognitive conditions and complications: Patient states that he is severely depressed and suicidal  Dimension 4:  Readiness to Change:  Dimension 4:  Description of Readiness to Change criteria: Patient states that he is ready to [employer] positve changes in his life and states that he wants to stop drinking  Dimension 5:  Relapse, Continued use, or Continued Problem Potential:  Dimension 5:  Relapse, continued use, or continued problem potential critiera description: Patient has a history of chronic relapses  Dimension 6:  Recovery/Living Environment:  Dimension 6:  Recovery/Iiving environment criteria description: Patient is hoeless and has little emotional suupport  ASAM Severity Score: ASAM's Severity Rating Score: 13  ASAM Recommended Level of Treatment: ASAM Recommended Level of Treatment: Level III Residential Treatment   Substance use Disorder (SUD) Substance Use Disorder (SUD)  Checklist Symptoms of Substance Use: Continued use  despite having a persistent/recurrent physical/psychological problem caused/exacerbated by use,Continued use despite persistent or recurrent social, interpersonal problems, caused or exacerbated by use,Evidence of tolerance,Evidence of withdrawal (Comment),Large amounts of time spent to obtain, use or recover from the substance(s),Persistent desire or unsuccessful efforts to cut down or control use,Presence of craving or strong urge to use,Recurrent use that  results in a failure to fulfill major role obligations (work, school, home),Repeated use in physically hazardous situations,Social, occupational, recreational activities given up or reduced due to use,Substance(s) often taken in larger amounts or over longer times than was intended  Recommendations for Services/Supports/Treatments: Recommendations for Services/Supports/Treatments Recommendations For Services/Supports/Treatments: Residential-Level 3,Inpatient Hospitalization,Individual Therapy,Detox,Medication Management  DSM5 Diagnoses: Patient Active Problem List   Diagnosis Date Noted  . Alcohol withdrawal seizure without complication (HCC)   . 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 Elevated troponin 10/19/2020  . Alcohol withdrawal (HCC) 10/19/2020  . Alcohol abuse   . Suicidal ideation 06/14/2020  . Pancreatitis 06/13/2020  . 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  . Malnutrition of moderate degree 05/07/2020  . Gastrointestinal hemorrhage   . Alcoholic hepatitis without ascites   . Melena 05/05/2020  . Alcoholic ketoacidosis 05/05/2020  . Thrombocytopenia (HCC) 07/25/2019  . Transaminitis 07/25/2019  . Traumatic subdural hematoma (HCC) 07/02/2019  . Alcohol abuse with intoxication (HCC) 07/02/2019  . 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 Alcohol use disorder, severe, dependence (HCC) 04/16/2019  . 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 HIV disease (HCC) 06/16/2018  . Polysubstance abuse (HCC) 06/16/2018  . Cigarette smoker 06/16/2018   Patient Centered Plan: Patient is on the following Treatment Plan(s):    Referrals to Alternative Service(s): Referred to Alternative Service(s):   Place:   Date:   Time:    Referred to Alternative Service(s):   Place:   Date:   Time:    Referred to Alternative Service(s):   Place:   Date:   Time:    Referred to Alternative Service(s):   Place:   Date:   Time:      Burnetta Sabin, [facility]

## 2021-04-07 NOTE — ED Triage Notes (Signed)
Pt DC this morning, states he drank two bottles of wine since leaving and is feeling bad and had to come back.

## 2021-04-07 NOTE — ED Notes (Signed)
Patient states that he will not hurt male staff.

## 2021-04-07 NOTE — ED Notes (Signed)
Restraints discontinued.

## 2021-04-07 NOTE — ED Notes (Signed)
Pt has been brought on the unit. Pt has been familiarized with the unit. Nutrition has been offered which patient declined stating that his stomach wont allow him to eat anything right now as it might make him sick. Pt has been given his medication with apple juice. Pt is lying in bed quietly with his eyes open. Environment check has been complete and environment is secured, breathing is even and unlabored. Will continue to monitor patient for safety.

## 2021-04-07 NOTE — ED Provider Notes (Signed)
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 (NOTE) DRUG SCREEN FOR MEDICAL PURPOSES ONLY.  IF CONFIRMATION IS NEEDED FOR ANY PURPOSE, NOTIFY LAB WITHIN 5 DAYS.  LOWEST DETECTABLE LIMITS FOR URINE DRUG SCREEN Drug Class                     Cutoff  (ng/mL) Amphetamine and metabolites    1000 Barbiturate and metabolites    200 Benzodiazepine                 200 Tricyclics and metabolites     300 Opiates and metabolites        300 Cocaine and metabolites  300 THC                            50 Performed at [facility], [address] [place], [place] [address]   . SARS Coronavirus 2 by RT PCR 03/07/2021 NEGATIVE  NEGATIVE Final   Comment: (NOTE) SARS-CoV-2 target nucleic acids are NOT DETECTED.  The SARS-CoV-2 RNA is generally detectable in upper respiratory specimens during the acute phase of infection. The lowest concentration of SARS-CoV-2 viral copies this assay can detect is 138 copies/mL. A negative result does not preclude SARS-Cov-2 infection and should not be used as the sole basis for treatment or other patient management decisions. A negative result may occur with  improper specimen collection/handling, submission of specimen other than nasopharyngeal swab, presence of viral mutation(s) within the areas targeted by this assay, and inadequate number of viral copies(<138 copies/mL). A negative result must be combined with clinical observations, patient history, and epidemiological information. The expected result is Negative.  Fact Sheet for Patients:  BloggerCourse.com  Fact Sheet for Healthcare Providers:  SeriousBroker.it  This test is no                          t yet approved or cleared by the [place] FDA and  has been authorized for detection and/or diagnosis of SARS-CoV-2 by FDA under an Emergency Use Authorization (EUA). This EUA will remain  in effect (meaning this test can be used) for the duration of the COVID-19 declaration under Section 564(b)(1) of the Act, 21 U.S.C.section 360bbb-3(b)(1), unless the authorization is terminated  or revoked sooner.      . Influenza A by PCR 03/07/2021 NEGATIVE  NEGATIVE Final  .  Influenza B by PCR 03/07/2021 NEGATIVE  NEGATIVE Final   Comment: (NOTE) The Xpert Xpress SARS-CoV-2/FLU/RSV plus assay is intended as an aid in the diagnosis of influenza from Nasopharyngeal swab specimens and should not be used as a sole basis for treatment. Nasal washings and aspirates are unacceptable for Xpert Xpress SARS-CoV-2/FLU/RSV testing.  Fact Sheet for Patients: BloggerCourse.com  Fact Sheet for Healthcare Providers: SeriousBroker.it  This test is not yet approved or cleared by the [place] FDA and has been authorized for detection and/or diagnosis of SARS-CoV-2 by FDA under an Emergency Use Authorization (EUA). This EUA will remain in effect (meaning this test can be used) for the duration of the COVID-19 declaration under Section 564(b)(1) of the Act, 21 U.S.C. section 360bbb-3(b)(1), unless the authorization is terminated or revoked.  Performed at [facility], [address], [place], [place] [address]   . WBC 03/08/2021 2.8* 4.0 - 10.5 K/uL Final  . RBC 03/08/2021 3.78* 4.22 - 5.81 MIL/uL Final  . Hemoglobin 03/08/2021 12.7* 13.0 - 17.0 g/dL Final  . HCT 09/81/1914 37.2* 39.0 - 52.0 % Final  . MCV 03/08/2021 98.4  80.0 - 100.0 fL Final   DELTA CHECK NOTED  . MCH 03/08/2021 33.6  26.0 - 34.0 pg Final  . MCHC 03/08/2021 34.1  30.0 - 36.0 g/dL Final  . RDW 78/29/5621 15.0  11.5 - 15.5 % Final  . Platelets 03/08/2021 106* 150 - 400 K/uL Final   Comment: SPECIMEN CHECKED FOR CLOTS Immature Platelet Fraction may be clinically indicated, consider ordering this additional test [id number] PLATELET COUNT CONFIRMED BY SMEAR   . nRBC 03/08/2021 0.0  0.0 - 0.2 % Final   Performed at [facility], [address] [place], [place] [address]  . Sodium 03/08/2021 140  135 - 145 mmol/L Final  . Potassium 03/08/2021 3.3* 3.5 - 5.1 mmol/L Final  . Chloride 03/08/2021 104  98 - 111  mmol/L Final  . CO2 03/08/2021 28  22 - 32 mmol/L Final  . Glucose, Bld 03/08/2021 87  70 - 99 mg/dL Final   Glucose reference range applies only to samples taken after fasting for at least 8 hours.  . BUN 03/08/2021 8  6 - 20 mg/dL Final  . Creatinine, Ser 03/08/2021 0.53* 0.61 - 1.24 mg/dL Final  . Calcium 40/34/742504/30/2022 9.0  8.9 - 10.3 mg/dL Final  . Total Protein 03/08/2021 6.7  6.5 - 8.1 g/dL Final  . Albumin 95/63/875604/30/2022 3.9  3.5 - 5.0 g/dL Final  . AST 43/32/951804/30/2022 54* 15 - 41 U/L Final  . ALT 03/08/2021 45* 0 - 44 U/L Final  . Alkaline Phosphatase 03/08/2021 46  38 - 126 U/L Final  . Total Bilirubin 03/08/2021 0.5  0.3 - 1.2 mg/dL Final  . GFR, Estimated 03/08/2021 >60  >60 mL/min Final   Comment: (NOTE) Calculated using the CKD-EPI Creatinine Equation (2021)   . Anion gap 03/08/2021 8  5 - 15 Final   Performed at [facility], [address] [place], [place] [address]  . Alcohol, Ethyl (B) 03/08/2021 247* <10 mg/dL Final   Comment: (NOTE) Lowest detectable limit for serum alcohol is 10 mg/dL.  For medical purposes only. Performed at [facility], [address] [place], [place] [address]   Admission on 03/06/2021, Discharged on 03/07/2021  Component Date Value Ref Range Status  . Sodium 03/06/2021 142  135 - 145 mmol/L Final  . Potassium 03/06/2021 3.7  3.5 - 5.1 mmol/L Final  . Chloride 03/06/2021 105  98 - 111 mmol/L Final  . CO2 03/06/2021 24  22 - 32 mmol/L Final  . Glucose, Bld 03/06/2021 93  70 - 99 mg/dL Final   Glucose reference range applies only to samples taken after fasting for at least 8 hours.  . BUN 03/06/2021 7  6 - 20 mg/dL Final  . Creatinine, Ser 03/06/2021 0.81  0.61 - 1.24 mg/dL Final  . Calcium 60/10/932304/28/2022 9.1  8.9 - 10.3 mg/dL Final  . Total Protein 03/06/2021 8.4* 6.5 - 8.1 g/dL Final  . Albumin 55/73/220204/28/2022 4.6  3.5 - 5.0 g/dL Final  . AST 54/27/062304/28/2022 88* 15 - 41 U/L Final  . ALT 03/06/2021 62* 0 - 44 U/L Final  .  Alkaline Phosphatase 03/06/2021 50  38 - 126 U/L Final  . Total Bilirubin 03/06/2021 0.6  0.3 - 1.2 mg/dL Final  . GFR, Estimated 03/06/2021 >60  >60 mL/min Final   Comment: (NOTE) Calculated using the CKD-EPI Creatinine Equation (2021)   . Anion gap 03/06/2021 13  5 - 15 Final   Performed at [facility], [address] [place], [place] [address]  . Alcohol, Ethyl (B) 03/06/2021 441* <10 mg/dL Final   Comment: CRITICAL RESULT CALLED TO, READ BACK BY AND VERIFIED WITH: S.WEST, RN AT 1720 ON 04.28.22 BY N.THOMPSON (NOTE) Lowest detectable limit for serum alcohol is 10 mg/dL.  For medical purposes only. Performed at [facility], [address] [place], [place] [address]   . WBC 03/06/2021 5.1  4.0 - 10.5 K/uL Final  . RBC 03/06/2021 4.38  4.22 - 5.81 MIL/uL Final  . Hemoglobin 03/06/2021 14.7  13.0 - 17.0 g/dL  Final  . HCT 03/06/2021 43.1  39.0 - 52.0 % Final  . MCV 03/06/2021 98.4  80.0 - 100.0 fL Final  . MCH 03/06/2021 33.6  26.0 - 34.0 pg Final  . MCHC 03/06/2021 34.1  30.0 - 36.0 g/dL Final  . RDW 16/08/9603 15.0  11.5 - 15.5 % Final  . Platelets 03/06/2021 140* 150 - 400 K/uL Final  . nRBC 03/06/2021 0.0  0.0 - 0.2 % Final  . Neutrophils Relative % 03/06/2021 59  % Final  . Neutro Abs 03/06/2021 2.9  1.7 - 7.7 K/uL Final  . Lymphocytes Relative 03/06/2021 33  % Final  . Lymphs Abs 03/06/2021 1.7  0.7 - 4.0 K/uL Final  . Monocytes Relative 03/06/2021 6  % Final  . Monocytes Absolute 03/06/2021 0.3  0.1 - 1.0 K/uL Final  . Eosinophils Relative 03/06/2021 1  % Final  . Eosinophils Absolute 03/06/2021 0.1  0.0 - 0.5 K/uL Final  . Basophils Relative 03/06/2021 1  % Final  . Basophils Absolute 03/06/2021 0.1  0.0 - 0.1 K/uL Final  . Immature Granulocytes 03/06/2021 0  % Final  . Abs Immature Granulocytes 03/06/2021 0.01  0.00 - 0.07 K/uL Final   Performed at [facility], [address] [place], [place] [address]  .  Glucose-Capillary 03/06/2021 102* 70 - 99 mg/dL Final   Glucose reference range applies only to samples taken after fasting for at least 8 hours.  . Salicylate Lvl 03/06/2021 <7.0* 7.0 - 30.0 mg/dL Final   Performed at [facility], [address], [place], [place] [address]  . Acetaminophen (Tylenol), Serum 03/06/2021 <10* 10 - 30 ug/mL Final   Comment: (NOTE) Therapeutic concentrations vary significantly. A range of 10-30 ug/mL  may be an effective concentration for many patients. However, some  are best treated at concentrations outside of this range. Acetaminophen concentrations >150 ug/mL at 4 hours after ingestion  and >50 ug/mL at 12 hours after ingestion are often associated with  toxic reactions.  Performed at [facility], [address] [place], [place] [address]   . SARS Coronavirus 2 by RT PCR 03/06/2021 NEGATIVE  NEGATIVE Final   Comment: (NOTE) SARS-CoV-2 target nucleic acids are NOT DETECTED.  The SARS-CoV-2 RNA is generally detectable in upper respiratory specimens during the acute phase of infection. The lowest concentration of SARS-CoV-2 viral copies this assay can detect is 138 copies/mL. A negative result does not preclude SARS-Cov-2 infection and should not be used as the sole basis for treatment or other patient management decisions. A negative result may occur with  improper specimen collection/handling, submission of specimen other than nasopharyngeal swab, presence of viral mutation(s) within the areas targeted by this assay, and inadequate number of viral copies(<138 copies/mL). A negative result must be combined with clinical observations, patient history, and epidemiological information. The expected result is Negative.  Fact Sheet for Patients:  BloggerCourse.com  Fact Sheet for Healthcare Providers:  SeriousBroker.it  This test is no                          t  yet approved or cleared by the [place] FDA and  has been authorized for detection and/or diagnosis of SARS-CoV-2 by FDA under an Emergency Use Authorization (EUA). This EUA will remain  in effect (meaning this test can be used) for the duration of the COVID-19 declaration under Section 564(b)(1) of the Act, 21 U.S.C.section 360bbb-3(b)(1), unless the authorization  is terminated  or revoked sooner.      . Influenza A by PCR 03/06/2021 NEGATIVE  NEGATIVE Final  . Influenza B by PCR 03/06/2021 NEGATIVE  NEGATIVE Final   Comment: (NOTE) The Xpert Xpress SARS-CoV-2/FLU/RSV plus assay is intended as an aid in the diagnosis of influenza from Nasopharyngeal swab specimens and should not be used as a sole basis for treatment. Nasal washings and aspirates are unacceptable for Xpert Xpress SARS-CoV-2/FLU/RSV testing.  Fact Sheet for Patients: BloggerCourse.com  Fact Sheet for Healthcare Providers: SeriousBroker.it  This test is not yet approved or cleared by the [place] FDA and has been authorized for detection and/or diagnosis of SARS-CoV-2 by FDA under an Emergency Use Authorization (EUA). This EUA will remain in effect (meaning this test can be used) for the duration of the COVID-19 declaration under Section 564(b)(1) of the Act, 21 U.S.C. section 360bbb-3(b)(1), unless the authorization is terminated or revoked.  Performed at [facility], [address] [place], [place] [address]   Admission on 02/28/2021, Discharged on 02/28/2021  Component Date Value Ref Range Status  . WBC 02/28/2021 4.7  4.0 - 10.5 K/uL Final  . RBC 02/28/2021 4.15* 4.22 - 5.81 MIL/uL Final  . Hemoglobin 02/28/2021 13.9  13.0 - 17.0 g/dL Final  . HCT 84/69/6295 40.7  39.0 - 52.0 % Final  . MCV 02/28/2021 98.1  80.0 - 100.0 fL Final  . MCH 02/28/2021 33.5  26.0 - 34.0 pg Final  . MCHC 02/28/2021 34.2  30.0 - 36.0 g/dL Final  .  RDW 28/41/3244 14.8  11.5 - 15.5 % Final  . Platelets 02/28/2021 127* 150 - 400 K/uL Final  . nRBC 02/28/2021 0.0  0.0 - 0.2 % Final  . Neutrophils Relative % 02/28/2021 60  % Final  . Neutro Abs 02/28/2021 2.9  1.7 - 7.7 K/uL Final  . Lymphocytes Relative 02/28/2021 30  % Final  . Lymphs Abs 02/28/2021 1.4  0.7 - 4.0 K/uL Final  . Monocytes Relative 02/28/2021 7  % Final  . Monocytes Absolute 02/28/2021 0.4  0.1 - 1.0 K/uL Final  . Eosinophils Relative 02/28/2021 2  % Final  . Eosinophils Absolute 02/28/2021 0.1  0.0 - 0.5 K/uL Final  . Basophils Relative 02/28/2021 1  % Final  . Basophils Absolute 02/28/2021 0.1  0.0 - 0.1 K/uL Final  . Immature Granulocytes 02/28/2021 0  % Final  . Abs Immature Granulocytes 02/28/2021 0.01  0.00 - 0.07 K/uL Final   Performed at [facility], [address], [place], [place] [address]  . Sodium 02/28/2021 139  135 - 145 mmol/L Final  . Potassium 02/28/2021 3.8  3.5 - 5.1 mmol/L Final  . Chloride 02/28/2021 103  98 - 111 mmol/L Final  . CO2 02/28/2021 24  22 - 32 mmol/L Final  . Glucose, Bld 02/28/2021 106* 70 - 99 mg/dL Final   Glucose reference range applies only to samples taken after fasting for at least 8 hours.  . BUN 02/28/2021 5* 6 - 20 mg/dL Final  . Creatinine, Ser 02/28/2021 0.87  0.61 - 1.24 mg/dL Final  . Calcium 25/36/6440 8.7* 8.9 - 10.3 mg/dL Final  . Total Protein 02/28/2021 7.0  6.5 - 8.1 g/dL Final  . Albumin 34/74/2595 4.0  3.5 - 5.0 g/dL Final  . AST 63/87/5643 91* 15 - 41 U/L Final  . ALT 02/28/2021 55* 0 - 44 U/L Final  . Alkaline Phosphatase 02/28/2021 43  38 - 126 U/L Final  .  Total Bilirubin 02/28/2021 0.5  0.3 - 1.2 mg/dL Final  . GFR, Estimated 02/28/2021 >60  >60 mL/min Final   Comment: (NOTE) Calculated using the CKD-EPI Creatinine Equation (2021)   . Anion gap 02/28/2021 12  5 - 15 Final   Performed at [facility], [address] [place], [place] [address]  . Lipase 02/28/2021 44  11 - 51 U/L Final    Performed at [facility], [address], [place], [place] [address]  . Alcohol, Ethyl (B) 02/28/2021 358* <10 mg/dL Final   Comment: CRITICAL RESULT CALLED TO, READ BACK BY AND VERIFIED WITH: J DODD RN [phone] [id number] BY A BENNETT (NOTE) Lowest detectable limit for serum alcohol is 10 mg/dL.  For medical purposes only. Performed at [facility], [address], [place], [place] [address]   Admission on 02/25/2021, Discharged on 02/26/2021  Component Date Value Ref Range Status  . WBC 02/25/2021 4.8  4.0 - 10.5 K/uL Final  . RBC 02/25/2021 4.36  4.22 - 5.81 MIL/uL Final  . Hemoglobin 02/25/2021 14.6  13.0 - 17.0 g/dL Final  . HCT 24/58/0998 43.0  39.0 - 52.0 % Final  . MCV 02/25/2021 98.6  80.0 - 100.0 fL Final  . MCH 02/25/2021 33.5  26.0 - 34.0 pg Final  . MCHC 02/25/2021 34.0  30.0 - 36.0 g/dL Final  . RDW 33/82/5053 14.9  11.5 - 15.5 % Final  . Platelets 02/25/2021 159  150 - 400 K/uL Final  . nRBC 02/25/2021 0.0  0.0 - 0.2 % Final  . Neutrophils Relative % 02/25/2021 46  % Final  . Neutro Abs 02/25/2021 2.2  1.7 - 7.7 K/uL Final  . Lymphocytes Relative 02/25/2021 43  % Final  . Lymphs Abs 02/25/2021 2.1  0.7 - 4.0 K/uL Final  . Monocytes Relative 02/25/2021 6  % Final  . Monocytes Absolute 02/25/2021 0.3  0.1 - 1.0 K/uL Final  . Eosinophils Relative 02/25/2021 4  % Final  . Eosinophils Absolute 02/25/2021 0.2  0.0 - 0.5 K/uL Final  . Basophils Relative 02/25/2021 1  % Final  . Basophils Absolute 02/25/2021 0.1  0.0 - 0.1 K/uL Final  . Immature Granulocytes 02/25/2021 0  % Final  . Abs Immature Granulocytes 02/25/2021 0.01  0.00 - 0.07 K/uL Final   Performed at [facility], [address],  [place], [place] [address]  . Sodium 02/25/2021 139  135 - 145 mmol/L Final  . Potassium 02/25/2021 3.6  3.5 - 5.1 mmol/L Final  . Chloride 02/25/2021 103  98 - 111 mmol/L Final  . CO2 02/25/2021 23  22 - 32 mmol/L Final  . Glucose, Bld 02/25/2021 88  70 - 99  mg/dL Final   Glucose reference range applies only to samples taken after fasting for at least 8 hours.  . BUN 02/25/2021 7  6 - 20 mg/dL Final  . Creatinine, Ser 02/25/2021 0.64  0.61 - 1.24 mg/dL Final  . Calcium 41/93/7902 9.1  8.9 - 10.3 mg/dL Final  . GFR, Estimated 02/25/2021 >60  >60 mL/min Final   Comment: (NOTE) Calculated using the CKD-EPI Creatinine Equation (2021)   . Anion gap 02/25/2021 13  5 - 15 Final   Performed at [facility], [address] [place], [place] [address]  . Alcohol, Ethyl (B) 02/25/2021 286* <10 mg/dL Final   Comment: (NOTE) Lowest detectable limit for serum alcohol is 10 mg/dL.  For medical purposes only. Performed at [facility], [address] [place], [place] [address]   Admission on 02/23/2021, Discharged on 02/24/2021  Component Date Value Ref Range Status  . Alcohol, Ethyl (B) 02/24/2021 253* <10 mg/dL Final   Comment: (NOTE) Lowest detectable limit for serum alcohol is 10 mg/dL.  For medical purposes only. Performed at [facility], [address] [place], [place] [address]   . WBC 02/24/2021 4.3  4.0 - 10.5 K/uL Final  . RBC 02/24/2021 3.71* 4.22 - 5.81 MIL/uL Final  . Hemoglobin 02/24/2021 12.3* 13.0 - 17.0 g/dL Final  . HCT 11/91/4782 37.0* 39.0 - 52.0 % Final  . MCV 02/24/2021 99.7  80.0 - 100.0 fL Final  . MCH 02/24/2021 33.2  26.0 - 34.0 pg Final  . MCHC 02/24/2021 33.2  30.0 - 36.0 g/dL Final  . RDW 95/62/1308 15.1  11.5 - 15.5 % Final  . Platelets 02/24/2021 148* 150 - 400 K/uL Final  . nRBC 02/24/2021 0.0  0.0 - 0.2 % Final   Performed at [facility], [address] [place], [place] [address]  . Sodium 02/24/2021 141  135 - 145 mmol/L Final  . Potassium 02/24/2021 3.8  3.5 - 5.1 mmol/L Final  . Chloride 02/24/2021 106  98 - 111 mmol/L Final  . CO2 02/24/2021 22  22 - 32 mmol/L Final  . Glucose, Bld 02/24/2021 99  70 - 99 mg/dL Final   Glucose  reference range applies only to samples taken after fasting for at least 8 hours.  . BUN 02/24/2021 9  6 - 20 mg/dL Final  . Creatinine, Ser 02/24/2021 0.68  0.61 - 1.24 mg/dL Final  . Calcium 69/62/9528 8.6* 8.9 - 10.3 mg/dL Final  . Total Protein 02/24/2021 6.9  6.5 - 8.1 g/dL Final  . Albumin 41/32/4401 3.7  3.5 - 5.0 g/dL Final  . AST 02/72/5366 47* 15 - 41 U/L Final  . ALT 02/24/2021 37  0 - 44 U/L Final  . Alkaline Phosphatase 02/24/2021 39  38 - 126 U/L Final  . Total Bilirubin 02/24/2021 0.5  0.3 - 1.2 mg/dL Final  . GFR, Estimated 02/24/2021 >60  >60 mL/min Final   Comment: (NOTE) Calculated using the CKD-EPI Creatinine Equation (2021)   . Anion gap 02/24/2021 13  5 - 15 Final   Performed at [facility], [address], [place], [place] [address]  There may be more visits with results that are not included.    Allergies: Tegretol [carbamazepine] and Caffeine  PTA Medications: (Not in a hospital admission)   Medical Decision Making  Admission orders placed  Initiate CIWA protocol -lorazepam 1 mg tapered dose -lorazepam 1 mg every 6 hours prn for CIWA >10 -thiamine 100 mg daily for nutritional supplementation -hydroxyzine 25 mg every 6 hours prn for anxiety, CIWA < or = 10 -ondansetron 4 mg ODT every 6 hours prn nausea/vomiting -loperamide 2-4 mg capsule prn diarrhea or loose stools   Clinical Course as of 04/08/21 0210  Mon Apr 07, 2021  2251 POCT Urine Drug Screen - (ICup)(!) UDS positive for oxazepam, otherwise negative. Patient has received librium/ativan in the ED. [JB]  2252 SARSCOV2ONAVIRUS 2 AG: NEGATIVE [JB]  Tue Apr 08, 2021  0141 CBC with Differential/Platelet(!) RBC 3.99, CBC otherwise WNLs [JB]  0201 Alcohol, Ethyl (B)(!): 281 BAL 282, which is lower than previous ED visits [JB]  0209 AST(!): 55 AST 55, CMP otherwise WNLs [JB]    Clinical Course User Index [JB] Jackelyn Poling,  NP    Recommendations  Based on my evaluation  the patient does not appear to have an emergency medical condition.  Jackelyn Poling, NP 04/07/21  9:48 PM

## 2021-04-07 NOTE — ED Notes (Signed)
Pt threatening that staff will regret if we change his clothes. Says that he is not afraid to kill.

## 2021-04-07 NOTE — ED Provider Notes (Signed)
35 year old male with a chief complaints of alcohol intoxication.  I received the patient in signout from Dr. Lynelle Doctor.  Patient seen multiple times in this ED for the same.  Plan for reassessment once patient is sober.  Patient was reassessed no longer homicidal or suicidal.  He is somewhat tremulous on repeat assessment.  He does feel okay to go home at this time.  We will give a dose of Ativan prior to discharge.  Librium taper.   Melene Plan, DO 04/07/21 [phone]

## 2021-04-08 LAB — COMPREHENSIVE METABOLIC PANEL
ALT: 38 U/L (ref 0–44)
AST: 55 U/L — ABNORMAL HIGH (ref 15–41)
Albumin: 3.9 g/dL (ref 3.5–5.0)
Alkaline Phosphatase: 51 U/L (ref 38–126)
Anion gap: 12 (ref 5–15)
BUN: 10 mg/dL (ref 6–20)
CO2: 25 mmol/L (ref 22–32)
Calcium: 9.1 mg/dL (ref 8.9–10.3)
Chloride: 104 mmol/L (ref 98–111)
Creatinine, Ser: 0.72 mg/dL (ref 0.61–1.24)
GFR, Estimated: >60 mL/min (ref >60)
Glucose, Bld: 81 mg/dL (ref 70–99)
Potassium: 3.8 mmol/L (ref 3.5–5.1)
Sodium: 141 mmol/L (ref 135–145)
Total Bilirubin: 0.8 mg/dL (ref 0.3–1.2)
Total Protein: 6.9 g/dL (ref 6.5–8.1)

## 2021-04-08 LAB — CBC WITH DIFFERENTIAL/PLATELET
Abs Immature Granulocytes: 0.01 10*3/uL (ref 0.00–0.07)
Basophils Absolute: 0.1 10*3/uL (ref 0.0–0.1)
Basophils Relative: 1 %
Eosinophils Absolute: 0.2 10*3/uL (ref 0.0–0.5)
Eosinophils Relative: 4 %
HCT: 39.6 % (ref 39.0–52.0)
Hemoglobin: 13.4 g/dL (ref 13.0–17.0)
Immature Granulocytes: 0 %
Lymphocytes Relative: 38 %
Lymphs Abs: 1.8 10*3/uL (ref 0.7–4.0)
MCH: 33.6 pg (ref 26.0–34.0)
MCHC: 33.8 g/dL (ref 30.0–36.0)
MCV: 99.2 fL (ref 80.0–100.0)
Monocytes Absolute: 0.3 10*3/uL (ref 0.1–1.0)
Monocytes Relative: 6 %
Neutro Abs: 2.4 10*3/uL (ref 1.7–7.7)
Neutrophils Relative %: 51 %
Platelets: 166 10*3/uL (ref 150–400)
RBC: 3.99 MIL/uL — ABNORMAL LOW (ref 4.22–5.81)
RDW: 13.3 % (ref 11.5–15.5)
WBC: 4.8 10*3/uL (ref 4.0–10.5)
nRBC: 0 % (ref 0.0–0.2)

## 2021-04-08 LAB — ETHANOL: Alcohol, Ethyl (B): 281 mg/dL — ABNORMAL HIGH (ref <10)

## 2021-04-08 LAB — POC SARS CORONAVIRUS 2 AG -  ED: SARS Coronavirus 2 Ag: NEGATIVE

## 2021-04-08 LAB — RESP PANEL BY RT-PCR (FLU A&B, COVID) ARPGX2
Influenza A by PCR: NEGATIVE
Influenza B by PCR: NEGATIVE
SARS Coronavirus 2 by RT PCR: NEGATIVE

## 2021-04-08 MED ORDER — THIAMINE HCL 100 MG PO TABS: 100.0000 mg | ORAL_TABLET | Freq: Every day | ORAL | 0 refills | Status: DC

## 2021-04-08 NOTE — ED Notes (Signed)
Pt just took 1 mg ativan with apple juice, patient is lying in bed quietly, breathing even and unlabored, environment check complete will continue to monitor for safety
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
## 2021-04-08 NOTE — Discharge Instructions (Addendum)
Patient is instructed to take all prescribed medications as recommended. Report any side effects or adverse reactions to your outpatient psychiatrist. Patient is instructed to abstain from alcohol and illegal drugs while on prescription medications. In the event of worsening symptoms, patient is instructed to call the crisis hotline, 911, or go to the nearest emergency department for evaluation and treatment.   

## 2021-04-08 NOTE — ED Notes (Signed)
Patient received After Visit Summary with follow up instructions and medication sent to the pharmacy. All of patients belongings from [facility] locker was given to patient.

## 2021-04-08 NOTE — Progress Notes (Signed)
Per provider, Gary [name], patient interested in detox services and then residential services.  CSW consulted with patient about different detox facilities that were available to him. Patient reports that he is not interested in [facility], [facility]: [facility] and [facility].  Patient reports that he prefers to medically detox in the hospital.  CSW educated patient that [facility] usually can't assist with detox at this time and usually we assist patient to get to facility. Patient continues to refuse treatment and refused consenting to this CSW sending information to detox facilities.  CSW also educated patient about needing to be fully detoxed before being accepted to [facility].  At this time, patient reports that he would like to discharge.  CSW informed provider of patient request.    Anson Oregon, LCSW, LCAS Clincal Social Worker  [facility]

## 2021-04-08 NOTE — ED Notes (Signed)
Pt offered breakfast; declined. Given juice

## 2021-04-09 ENCOUNTER — Emergency Department (HOSPITAL_COMMUNITY)
Admission: EM | Admit: 2021-04-09 | Discharge: 2021-04-10 | Disposition: A | Discharge status: Home / Self Care | Payer: Self-pay | Attending: Emergency Medicine | Admitting: Emergency Medicine

## 2021-04-09 ENCOUNTER — Other Ambulatory Visit: Payer: Self-pay

## 2021-04-09 DIAGNOSIS — R4689 Other symptoms and signs involving appearance and behavior: Secondary | ICD-10-CM

## 2021-04-09 DIAGNOSIS — Z21 Asymptomatic human immunodeficiency virus [HIV] infection status: Secondary | ICD-10-CM | POA: Insufficient documentation

## 2021-04-09 DIAGNOSIS — S40012A Contusion of left shoulder, initial encounter: Secondary | ICD-10-CM | POA: Insufficient documentation

## 2021-04-09 DIAGNOSIS — Y908 Blood alcohol level of 240 mg/100 ml or more: Secondary | ICD-10-CM | POA: Insufficient documentation

## 2021-04-09 DIAGNOSIS — X58XXXA Exposure to other specified factors, initial encounter: Secondary | ICD-10-CM | POA: Insufficient documentation

## 2021-04-09 DIAGNOSIS — F10929 Alcohol use, unspecified with intoxication, unspecified: Secondary | ICD-10-CM

## 2021-04-09 DIAGNOSIS — R456 Violent behavior: Secondary | ICD-10-CM | POA: Insufficient documentation

## 2021-04-09 DIAGNOSIS — R45851 Suicidal ideations: Secondary | ICD-10-CM | POA: Insufficient documentation

## 2021-04-09 DIAGNOSIS — R45850 Homicidal ideations: Secondary | ICD-10-CM | POA: Insufficient documentation

## 2021-04-09 DIAGNOSIS — F17210 Nicotine dependence, cigarettes, uncomplicated: Secondary | ICD-10-CM | POA: Insufficient documentation

## 2021-04-09 DIAGNOSIS — F10220 Alcohol dependence with intoxication, uncomplicated: Secondary | ICD-10-CM | POA: Insufficient documentation

## 2021-04-09 DIAGNOSIS — Z20822 Contact with and (suspected) exposure to covid-19: Secondary | ICD-10-CM | POA: Insufficient documentation

## 2021-04-09 LAB — CBC WITH DIFFERENTIAL/PLATELET
Abs Immature Granulocytes: 0 10*3/uL (ref 0.00–0.07)
Basophils Absolute: 0 10*3/uL (ref 0.0–0.1)
Basophils Relative: 1 %
Eosinophils Absolute: 0.1 10*3/uL (ref 0.0–0.5)
Eosinophils Relative: 3 %
HCT: 37.6 % — ABNORMAL LOW (ref 39.0–52.0)
Hemoglobin: 12.7 g/dL — ABNORMAL LOW (ref 13.0–17.0)
Immature Granulocytes: 0 %
Lymphocytes Relative: 53 %
Lymphs Abs: 1.3 10*3/uL (ref 0.7–4.0)
MCH: 34 pg (ref 26.0–34.0)
MCHC: 33.8 g/dL (ref 30.0–36.0)
MCV: 100.8 fL — ABNORMAL HIGH (ref 80.0–100.0)
Monocytes Absolute: 0.2 10*3/uL (ref 0.1–1.0)
Monocytes Relative: 7 %
Neutro Abs: 0.9 10*3/uL — ABNORMAL LOW (ref 1.7–7.7)
Neutrophils Relative %: 36 %
Platelets: 112 10*3/uL — ABNORMAL LOW (ref 150–400)
RBC: 3.73 MIL/uL — ABNORMAL LOW (ref 4.22–5.81)
RDW: 13.5 % (ref 11.5–15.5)
WBC: 2.5 10*3/uL — ABNORMAL LOW (ref 4.0–10.5)
nRBC: 0 % (ref 0.0–0.2)

## 2021-04-09 LAB — ETHANOL: Alcohol, Ethyl (B): 337 mg/dL (ref <10)

## 2021-04-09 LAB — COMPREHENSIVE METABOLIC PANEL
ALT: 45 U/L — ABNORMAL HIGH (ref 0–44)
AST: 65 U/L — ABNORMAL HIGH (ref 15–41)
Albumin: 4.1 g/dL (ref 3.5–5.0)
Alkaline Phosphatase: 55 U/L (ref 38–126)
Anion gap: 10 (ref 5–15)
BUN: 8 mg/dL (ref 6–20)
CO2: 27 mmol/L (ref 22–32)
Calcium: 8.6 mg/dL — ABNORMAL LOW (ref 8.9–10.3)
Chloride: 108 mmol/L (ref 98–111)
Creatinine, Ser: 0.61 mg/dL (ref 0.61–1.24)
GFR, Estimated: >60 mL/min (ref >60)
Glucose, Bld: 102 mg/dL — ABNORMAL HIGH (ref 70–99)
Potassium: 3.5 mmol/L (ref 3.5–5.1)
Sodium: 145 mmol/L (ref 135–145)
Total Bilirubin: 0.4 mg/dL (ref 0.3–1.2)
Total Protein: 7.6 g/dL (ref 6.5–8.1)

## 2021-04-09 LAB — RAPID URINE DRUG SCREEN, HOSP PERFORMED
Amphetamines: NOT DETECTED
Barbiturates: NOT DETECTED
Benzodiazepines: POSITIVE — AB
Cocaine: NOT DETECTED
Opiates: NOT DETECTED
Tetrahydrocannabinol: NOT DETECTED

## 2021-04-09 MED ORDER — LORAZEPAM 2 MG/ML IJ SOLN
INTRAMUSCULAR | Status: AC
  Administered 2021-04-09: 2 mg via INTRAMUSCULAR
  Filled 2021-04-09: qty 1

## 2021-04-09 MED ORDER — STERILE WATER FOR INJECTION IJ SOLN
INTRAMUSCULAR | Status: AC
  Filled 2021-04-09: qty 10

## 2021-04-09 MED ORDER — LORAZEPAM 2 MG/ML IJ SOLN: 2.0000 mg | Freq: Once | INTRAMUSCULAR | Status: AC

## 2021-04-09 MED ORDER — ZIPRASIDONE MESYLATE 20 MG IM SOLR: 20.0000 mg | Freq: Once | INTRAMUSCULAR | Status: AC

## 2021-04-09 MED ORDER — ZIPRASIDONE MESYLATE 20 MG IM SOLR
INTRAMUSCULAR | Status: AC
  Administered 2021-04-09: 20 mg via INTRAMUSCULAR
  Filled 2021-04-09: qty 20

## 2021-04-09 NOTE — ED Notes (Signed)
Security notified of pt's escalating behavior and bedside
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
## 2021-04-09 NOTE — ED Notes (Signed)
Pt states "fuck you" to sitter at bedside

## 2021-04-09 NOTE — ED Notes (Signed)
Effie Shy, MD bedside
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
## 2021-04-09 NOTE — ED Notes (Signed)
Pt refused VS  

## 2021-04-10 ENCOUNTER — Other Ambulatory Visit: Payer: Self-pay

## 2021-04-10 ENCOUNTER — Emergency Department (HOSPITAL_COMMUNITY)
Admission: EM | Admit: 2021-04-10 | Discharge: 2021-04-11 | Disposition: A | Discharge status: Home / Self Care | Payer: Self-pay | Attending: Emergency Medicine | Admitting: Emergency Medicine

## 2021-04-10 DIAGNOSIS — F10920 Alcohol use, unspecified with intoxication, uncomplicated: Secondary | ICD-10-CM

## 2021-04-10 DIAGNOSIS — Z79899 Other long term (current) drug therapy: Secondary | ICD-10-CM | POA: Insufficient documentation

## 2021-04-10 DIAGNOSIS — F10129 Alcohol abuse with intoxication, unspecified: Secondary | ICD-10-CM | POA: Insufficient documentation

## 2021-04-10 DIAGNOSIS — Z21 Asymptomatic human immunodeficiency virus [HIV] infection status: Secondary | ICD-10-CM | POA: Insufficient documentation

## 2021-04-10 DIAGNOSIS — Y908 Blood alcohol level of 240 mg/100 ml or more: Secondary | ICD-10-CM | POA: Insufficient documentation

## 2021-04-10 DIAGNOSIS — F17210 Nicotine dependence, cigarettes, uncomplicated: Secondary | ICD-10-CM | POA: Insufficient documentation

## 2021-04-10 DIAGNOSIS — R4689 Other symptoms and signs involving appearance and behavior: Secondary | ICD-10-CM

## 2021-04-10 DIAGNOSIS — R456 Violent behavior: Secondary | ICD-10-CM | POA: Insufficient documentation

## 2021-04-10 DIAGNOSIS — R45851 Suicidal ideations: Secondary | ICD-10-CM | POA: Insufficient documentation

## 2021-04-10 LAB — SARS CORONAVIRUS 2 (TAT 6-24 HRS): SARS Coronavirus 2: NEGATIVE

## 2021-04-10 MED ORDER — LORAZEPAM 1 MG PO TABS
2.0000 mg | ORAL_TABLET | Freq: Once | ORAL | Status: AC
  Administered 2021-04-10: 2 mg via ORAL
  Filled 2021-04-10: qty 2

## 2021-04-10 MED ORDER — ZIPRASIDONE MESYLATE 20 MG IM SOLR
20.0000 mg | Freq: Once | INTRAMUSCULAR | Status: AC
  Administered 2021-04-10: 20 mg via INTRAMUSCULAR
  Filled 2021-04-10: qty 20

## 2021-04-10 NOTE — ED Notes (Addendum)
Observer at [facility]ide

## 2021-04-10 NOTE — ED Triage Notes (Signed)
Patient states he was going to kill hisself and is looking for something sharp to stab himself with. ETOH on board. Refused vital from EMS. Patient is combative and uncooperative.

## 2021-04-10 NOTE — ED Notes (Signed)
Patient is resting comfortably. Dosing in and out of sleep.

## 2021-04-10 NOTE — ED Provider Notes (Signed)
Patient signed out to me by Dr. Effie Shy.  Patient presented with agitation and alcohol intoxication.  He has slept through the night without any further problems.  I checked on him this morning and he is easily arousable, feels like he is having some withdrawal symptoms.  Vital signs are stable, however.  He has not homicidal or suicidal at this time.  Patient administered p.o. Ativan and will be appropriate for discharge.   Gilda Crease, MD 04/10/21 [phone]

## 2021-04-10 NOTE — ED Provider Notes (Addendum)
[facility]-EMERGENCY DEPT Provider Note   CSN: [id number] Arrival date & time: 04/10/21  2331     History Chief Complaint  Patient presents with  . Suicidal    Gary [name] is a 35 y.o. male.  The history is provided by the patient and medical records.    35 year old male with history of alcohol abuse, HIV, presenting to the ED via EMS.  He was found wandering around in [facility], highly intoxicated and very agitated/yelling.  He has been refusing care with EMS, would not allow vitals.  On arrival, he is standing in the hallway yelling that he wants to die and asking to fight staff members.  Of note, seen last night for similar requiring geodon.  Past Medical History:  Diagnosis Date  . Alcohol abuse   . HIV (human immunodeficiency virus infection) (HCC)   . Subdural hematoma [facility])     Patient Active Problem List   Diagnosis Date Noted  . Alcohol withdrawal seizure without complication (HCC)   . 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 Elevated troponin 10/19/2020  . Alcohol withdrawal (HCC) 10/19/2020  . Alcohol abuse   . Suicidal ideation 06/14/2020  . Pancreatitis 06/13/2020  . 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  . Malnutrition of moderate degree 05/07/2020  . Gastrointestinal hemorrhage   . Alcoholic hepatitis without ascites   . Melena 05/05/2020  . Alcoholic ketoacidosis 05/05/2020  . Thrombocytopenia (HCC) 07/25/2019  . Transaminitis 07/25/2019  . Traumatic subdural hematoma (HCC) 07/02/2019  . Alcohol abuse with intoxication (HCC) 07/02/2019  . 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 Alcohol use disorder, severe, dependence (HCC) 04/16/2019  . 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 HIV disease (HCC) 06/16/2018  . Polysubstance abuse (HCC) 06/16/2018  . Cigarette smoker 06/16/2018    Past Surgical History:  Procedure Laterality Date  . INCISION AND  DRAINAGE PERIRECTAL ABSCESS N/A 09/24/2016   Procedure: IRRIGATION AND DEBRIDEMENT PERIRECTAL ABSCESS;  Surgeon: Ricarda Frame, MD;  Location: [facility] ORS;  Service: General;  Laterality: N/A;  . none         Family History  Problem Relation Age of Onset  . Alcohol abuse Mother   . Alcohol abuse Father     Social History   Tobacco Use  . Smoking status: Current Every Day Smoker    Packs/day: 0.50    Types: Cigarettes  . Smokeless tobacco: Never Used  . Tobacco comment: unable to smoke while incarcerated 6+ months 02/13/20  Vaping Use  . Vaping Use: Never used  Substance Use Topics  . Alcohol use: Yes    Comment: "I drink all I can"  . Drug use: Not Currently    Types: Marijuana, Methamphetamines    Comment: last used 04/10/2019    Home Medications Prior to Admission medications   Medication Sig Start Date End Date Taking? Authorizing Provider  chlordiazePOXIDE (LIBRIUM) 25 MG capsule 50mg  PO TID x 1D, then 25-50mg  PO BID X 1D, then 25-50mg  PO QD X 1D 04/07/21   04/09/21, DO  elvitegravir-cobicistat-emtricitabine-tenofovir (GENVOYA) 150-150-200-10 MG TABS tablet Take 1 tablet by mouth daily with breakfast. 03/19/21   05/19/21, MD  thiamine 100 MG tablet Take 1 tablet (100 mg total) by mouth daily. 04/08/21   04/10/21, MD  famotidine (PEPCID) 20 MG tablet Take 1 tablet (20 mg total) by mouth 2 (two) times daily. Patient not taking: Reported on 06/01/2019 05/14/19 06/02/19  06/04/19, MD  Allergies    Tegretol [carbamazepine] and Caffeine  Review of Systems   Review of Systems  Unable to perform ROS: Other    Physical Exam Updated Vital Signs BP 123/84   Pulse 76   Temp 97.9 F (36.6 C) (Oral)   Resp 16   SpO2 96%   Physical Exam Vitals and nursing note reviewed.  Constitutional:      Appearance: He is well-developed.     Comments: Standing at nurses station with fists clenched, fighting position Hostile and yelling  HENT:     Head:  Normocephalic and atraumatic.  Eyes:     Conjunctiva/sclera: Conjunctivae normal.     Pupils: Pupils are equal, round, and reactive to light.  Cardiovascular:     Rate and Rhythm: Normal rate and regular rhythm.     Heart sounds: Normal heart sounds.  Pulmonary:     Effort: Pulmonary effort is normal.     Breath sounds: Normal breath sounds.  Abdominal:     General: Bowel sounds are normal.     Palpations: Abdomen is soft.  Musculoskeletal:        General: Normal range of motion.     Cervical back: Normal range of motion.  Skin:    General: Skin is warm and dry.  Neurological:     Mental Status: He is alert and oriented to person, place, and time.  Psychiatric:        Mood and Affect: Affect is angry.        Thought Content: Thought content includes suicidal ideation.     ED Results / Procedures / Treatments   Labs (all labs ordered are listed, but only abnormal results are displayed) Labs Reviewed  COMPREHENSIVE METABOLIC PANEL - Abnormal; Notable for the following components:      Result Value   Potassium 3.3 (*)    Glucose, Bld 108 (*)    AST 57 (*)    ALT 46 (*)    All other components within normal limits  ETHANOL - Abnormal; Notable for the following components:   Alcohol, Ethyl (B) 359 (*)    All other components within normal limits  SALICYLATE LEVEL - Abnormal; Notable for the following components:   Salicylate Lvl <7.0 (*)    All other components within normal limits  ACETAMINOPHEN LEVEL - Abnormal; Notable for the following components:   Acetaminophen (Tylenol), Serum <10 (*)    All other components within normal limits  CBC - Abnormal; Notable for the following components:   WBC 3.8 (*)    RBC 3.80 (*)    Hemoglobin 12.8 (*)    HCT 38.6 (*)    MCV 101.6 (*)    Platelets 100 (*)    All other components within normal limits  RAPID URINE DRUG SCREEN, HOSP PERFORMED    EKG None  Radiology No results found.  Procedures Procedures   CRITICAL  CARE Performed by: Garlon Hatchet   Total critical care time: 35 minutes  Critical care time was exclusive of separately billable procedures and treating other patients.  Critical care was necessary to treat or prevent imminent or life-threatening deterioration.  Critical care was time spent personally by me on the following activities: development of treatment plan with patient and/or surrogate as well as nursing, discussions with consultants, evaluation of patient's response to treatment, examination of patient, obtaining history from patient or surrogate, ordering and performing treatments and interventions, ordering and review of laboratory studies, ordering and review of radiographic studies, pulse  oximetry and re-evaluation of patient's condition.   Medications Ordered in ED Medications  ziprasidone (GEODON) injection 20 mg (20 mg Intramuscular Given 04/10/21 2350)  sterile water (preservative free) injection (2 mLs  Given 04/11/21 0114)  sterile water (preservative free) injection 2 mL (2 mLs Injection Given 04/11/21 0116)    ED Course  I have reviewed the triage vital signs and the nursing notes.  Pertinent labs & imaging results that were available during my care of the patient were reviewed by me and considered in my medical decision making (see chart for details).    MDM Rules/Calculators/A&P  35 year old male here acutely intoxicated.  He is well-known to this facility for same.  He is agitated and aggressive here which is also typical for him.  He was actually seen last evening for same requiring sedation with Geodon.  On arrival, he is standing at nurses station with fists clenched, screaming at staff.  He then tried to break a [employer] to cut himself.  Patient was given Geodon.  Labs were obtained, ethanol is 359.  Has transaminitis which is likely alcohol related and appears chronic based on prior values.  Patient will need to metabolize and be reassessed in the  morning as he generally retracts his suicidal statements once he is sober.  Care will be signed out to morning team to reassess once clinically sober.  Final Clinical Impression(s) / ED Diagnoses Final diagnoses:  Alcoholic intoxication without complication [facility])  Aggressive behavior    Rx / DC Orders ED Discharge Orders    None       Garlon Hatchet, PA-C 04/11/21 0607    Garlon Hatchet, PA-C 04/11/21 0636    Sabas Sous, MD 04/11/21 [phone]

## 2021-04-10 NOTE — ED Notes (Signed)
Unable to get vitals. Pt hostile. Security at bedside

## 2021-04-11 LAB — CBC
HCT: 38.6 % — ABNORMAL LOW (ref 39.0–52.0)
Hemoglobin: 12.8 g/dL — ABNORMAL LOW (ref 13.0–17.0)
MCH: 33.7 pg (ref 26.0–34.0)
MCHC: 33.2 g/dL (ref 30.0–36.0)
MCV: 101.6 fL — ABNORMAL HIGH (ref 80.0–100.0)
Platelets: 100 10*3/uL — ABNORMAL LOW (ref 150–400)
RBC: 3.8 MIL/uL — ABNORMAL LOW (ref 4.22–5.81)
RDW: 13.7 % (ref 11.5–15.5)
WBC: 3.8 10*3/uL — ABNORMAL LOW (ref 4.0–10.5)
nRBC: 0 % (ref 0.0–0.2)

## 2021-04-11 LAB — URINALYSIS, ROUTINE W REFLEX MICROSCOPIC
Bacteria, UA: NONE SEEN
Bilirubin Urine: NEGATIVE
Glucose, UA: 50 mg/dL — AB
Ketones, ur: 5 mg/dL — AB
Leukocytes,Ua: NEGATIVE
Nitrite: NEGATIVE
Protein, ur: 100 mg/dL — AB
RBC / HPF: >50 RBC/hpf — ABNORMAL HIGH (ref 0–5)
Specific Gravity, Urine: 1.035 — ABNORMAL HIGH (ref 1.005–1.030)
pH: 5 (ref 5.0–8.0)

## 2021-04-11 LAB — COMPREHENSIVE METABOLIC PANEL
ALT: 46 U/L — ABNORMAL HIGH (ref 0–44)
AST: 57 U/L — ABNORMAL HIGH (ref 15–41)
Albumin: 4.2 g/dL (ref 3.5–5.0)
Alkaline Phosphatase: 50 U/L (ref 38–126)
Anion gap: 11 (ref 5–15)
BUN: 16 mg/dL (ref 6–20)
CO2: 25 mmol/L (ref 22–32)
Calcium: 8.9 mg/dL (ref 8.9–10.3)
Chloride: 106 mmol/L (ref 98–111)
Creatinine, Ser: 0.68 mg/dL (ref 0.61–1.24)
GFR, Estimated: >60 mL/min (ref >60)
Glucose, Bld: 108 mg/dL — ABNORMAL HIGH (ref 70–99)
Potassium: 3.3 mmol/L — ABNORMAL LOW (ref 3.5–5.1)
Sodium: 142 mmol/L (ref 135–145)
Total Bilirubin: 0.8 mg/dL (ref 0.3–1.2)
Total Protein: 7.6 g/dL (ref 6.5–8.1)

## 2021-04-11 LAB — RAPID URINE DRUG SCREEN, HOSP PERFORMED
Amphetamines: NOT DETECTED
Barbiturates: NOT DETECTED
Benzodiazepines: POSITIVE — AB
Cocaine: NOT DETECTED
Opiates: NOT DETECTED
Tetrahydrocannabinol: NOT DETECTED

## 2021-04-11 LAB — ETHANOL: Alcohol, Ethyl (B): 359 mg/dL (ref <10)

## 2021-04-11 LAB — ACETAMINOPHEN LEVEL: Acetaminophen (Tylenol), Serum: <10 ug/mL — ABNORMAL LOW (ref 10–30)

## 2021-04-11 LAB — SALICYLATE LEVEL: Salicylate Lvl: <7 mg/dL — ABNORMAL LOW (ref 7.0–30.0)

## 2021-04-11 MED ORDER — LORAZEPAM 2 MG/ML IJ SOLN
2.0000 mg | Freq: Once | INTRAMUSCULAR | Status: AC
  Administered 2021-04-11: 2 mg via INTRAMUSCULAR
  Filled 2021-04-11: qty 1

## 2021-04-11 MED ORDER — STERILE WATER FOR INJECTION IJ SOLN
2.0000 mL | Freq: Once | INTRAMUSCULAR | Status: AC
  Administered 2021-04-11: 2 mL via INTRAMUSCULAR

## 2021-04-11 MED ORDER — CEFTRIAXONE SODIUM 1 G IJ SOLR: 500.0000 mg | Freq: Once | INTRAMUSCULAR | Status: DC

## 2021-04-11 MED ORDER — AZITHROMYCIN 250 MG PO TABS
1000.0000 mg | ORAL_TABLET | Freq: Once | ORAL | Status: AC
  Administered 2021-04-11: 1000 mg via ORAL
  Filled 2021-04-11: qty 4

## 2021-04-11 MED ORDER — STERILE WATER FOR INJECTION IJ SOLN
INTRAMUSCULAR | Status: AC
  Administered 2021-04-11: 2 mL
  Filled 2021-04-11: qty 10

## 2021-04-11 MED ORDER — DOXYCYCLINE HYCLATE 100 MG PO TABS
100.0000 mg | ORAL_TABLET | Freq: Once | ORAL | Status: AC
  Administered 2021-04-11: 100 mg via ORAL
  Filled 2021-04-11: qty 1

## 2021-04-11 NOTE — BH Assessment (Signed)
Comprehensive Clinical Assessment (CCA) Note  04/11/2021 NATANIEL GASPER [id number]   DISPOSITION: Gave clinical report to Lorel Monaco- Laural Benes , NP but consult was removed for TTS . No disposition determined. Per ED patient discharged to home.   Flowsheet Row ED from 04/10/2021 in [facility] from 04/09/2021 in [facility] from 04/07/2021 in [facility]  C-SSRS RISK CATEGORY Error: Q2 is Yes, you must answer 3, 4, and 5 High Risk High Risk    The patient demonstrates the following risk factors for suicide: Chronic risk factors for suicide include: substance use disorder. Acute risk factors for suicide include: loss (financial, interpersonal, professional)  . Protective factors for this patient include: life satisfaction. Considering these factors, the overall suicide risk at this point appears to be high. Patient is appropriate for outpatient follow up.  Kevonte Granquist is a 35 year old male presenting voluntary to [facility] due to withdrawals for ETOH . Patient seen approx 32 times in the ED in the last 6 months. Patient reported drinking 2 bottles of wine today. Patient denied SI/HI / AVH during interview . Patient currently requesting detox. suicidal. Per chart, patient BAL was 359 on 04/10/21. Per chart, patient was seen on 04/06/21, see below. Patient was then discharged on 04/07/21 at approx 7am, then returned to [facility] at approx 1122am and discharged shortly once he was stable. Patient reported  Patient reported being homeless. Patient reported no support system. Patient denied receiving any outpatient mental health services. When asked if on psych medications, patient reported only being on HIV medications. Patient denied access go guns. Patient was cooperative during assessment.  ED Provider Note :  Assumed care at shift change change, 3 PM. Plan to follow UA.  If blood is present plan to treat  prophylactically for STDs with Rocephin and Doxy.  Not actively suicidal at this time, plan for discharge to home if still no active suicidal ideations. Likely related to alcohol intoxication.  U/A with blood, no bacteria. GC/Ch pending.  Patient evaluated, is resting in the bed, in no acute distress.  He is appropriate and calm.  No current complaints or reported active suicidal ideation.  TTS consult discontinued. Discussed UA results and plan to cover for STD.  He is in agreement with this and is comfortable with discharge.  States he cannot afford any prescriptions.  For this reason we will treat with Rocephin and azithromycin  DISPOSITION: Gave clinical report to Lorel Monaco- Laural Benes , NP but consult was removed for TTS . No disposition determined. Per ED patient discharged to home.     Chief Complaint:  Chief Complaint  Patient presents with  . Suicidal   Visit Diagnosis: Alcohol intoxication with moderate or severe use disorder, uncomplicated (HCC)    CCA Screening, Triage and Referral (STR)  Patient Reported Information How did you hear about [place]? Self  Referral name: No data recorded Referral phone number: No data recorded  Whom do you see for routine medical problems? I don't have a doctor  Practice/Facility Name: No data recorded Practice/Facility Phone Number: No data recorded Name of Contact: No data recorded Contact Number: No data recorded Contact Fax Number: No data recorded Prescriber Name: No data recorded Prescriber Address (if known): No data recorded  What Is the Reason for Your Visit/Call Today? SI with plan to drink self to death.  How Long Has This Been Causing You Problems? <Week  What Do You Feel Would Help  You the Most Today? Treatment for Depression or other mood problem   Have You Recently Been in Any Inpatient Treatment (Hospital/Detox/Crisis Center/28-Day Program)? No  Name/Location of Program/Hospital:No data recorded How Long Were You  There? No data recorded When Were You Discharged? No data recorded  Have You Ever Received Services From [facility] Before? Yes  Who Do You See at [facility]? Pt has been assessed by TTS in the past for similar presentation   Have You Recently Had Any Thoughts About Hurting Yourself? Yes  Are You Planning to Commit Suicide/Harm Yourself At This time? Yes   Have you Recently Had Thoughts About Hurting Someone [name]? Yes  Explanation: I violence in my head.   Have You Used Any Alcohol or Drugs in the Past 24 Hours? Yes  How Long Ago Did You Use Drugs or Alcohol? 0100  What Did You Use and How Much? Bottle of alcohol   Do You Currently Have a Therapist/Psychiatrist? No  Name of Therapist/Psychiatrist: No data recorded  Have You Been Recently Discharged From Any Office Practice or Programs? No  Explanation of Discharge From Practice/Program: No data recorded    CCA Screening Triage Referral Assessment Type of Contact: Face-to-Face  Is this Initial or Reassessment? Initial Assessment  Date Telepsych consult ordered in [facility]:  03/08/2021  Time Telepsych consult ordered in [facility]:  0512   Patient Reported Information Reviewed? Yes  Patient Left Without Being Seen? No data recorded Reason for Not Completing Assessment: Patient unable to participate in interview. Currently too impaired.   Collateral Involvement: None noted   Does Patient Have a Automotive engineer Guardian? No data recorded Name and Contact of Legal Guardian: Self  If Minor and Not Living with Parent(s), Who has Custody? n/a  Is CPS involved or ever been involved? Never  Is APS involved or ever been involved? Never   Patient Determined To Be At Risk for Harm To Self or Others Based on Review of Patient Reported Information or Presenting Complaint? Yes, for Self-Harm  Method: No data recorded Availability of Means: No data recorded Intent: No data recorded Notification Required: No data  recorded Additional Information for Danger to Others Potential: No data recorded Additional Comments for Danger to Others Potential: No data recorded Are There Guns or Other Weapons in Your Home? No data recorded Types of Guns/Weapons: No data recorded Are These Weapons Safely Secured?                            No data recorded Who Could Verify You Are Able To Have These Secured: No data recorded Do You Have any Outstanding Charges, Pending Court Dates, Parole/Probation? No data recorded Contacted To Inform of Risk of Harm To Self or Others: Other: Comment (NA)   Location of Assessment: [facility] ED   Does Patient Present under Involuntary Commitment? No  IVC Papers Initial File Date: No data recorded  [place] of Residence: [place]   Patient Currently Receiving the Following Services: Peer Support Services   Determination of Need: Emergent (2 hours)   Options For Referral: Medication Management; Inpatient Hospitalization; Outpatient Therapy     CCA Biopsychosocial Intake/Chief Complaint:  Detox off Alcohol  Current Symptoms/Problems: with drawl symtoms   Patient Reported Schizophrenia/Schizoaffective Diagnosis in Past: No   Strengths: None reported  Preferences: None reported  Abilities: None reported   Type of Services Patient Feels are Needed: None reported   Initial Clinical Notes/Concerns: None  Mental Health Symptoms Depression:  Change in energy/activity; Fatigue; Hopelessness; Increase/decrease in appetite; Sleep (too much or little); Tearfulness; Worthlessness   Duration of Depressive symptoms: Greater than two weeks   Mania:  None   Anxiety:   Fatigue   Psychosis:  None   Duration of Psychotic symptoms: Less than six months   Trauma:  None   Obsessions:  None   Compulsions:  None   Inattention:  None   Hyperactivity/Impulsivity:  N/A   Oppositional/Defiant Behaviors:  None   Emotional Irregularity:  Chronic feelings of emptiness    Other Mood/Personality Symptoms:  No data recorded   Mental Status Exam Appearance and self-care  Stature:  Average   Weight:  Thin   Clothing:  Disheveled   Grooming:  Neglected   Cosmetic use:  None   Posture/gait:  Slumped   Motor activity:  Slowed   Sensorium  Attention:  Normal   Concentration:  Normal   Orientation:  Person; Place; Situation; Time; Object   Recall/memory:  Normal   Affect and Mood  Affect:  Depressed; Flat   Mood:  Depressed   Relating  Eye contact:  Normal   Facial expression:  Depressed; Sad   Attitude toward examiner:  Cooperative   Thought and Language  Speech flow: Slurred; Slow   Thought content:  Appropriate to Mood and Circumstances   Preoccupation:  None   Hallucinations:  None   Organization:  No data recorded  Affiliated Computer ServicesExecutive Functions  Fund of Knowledge:  Fair   Intelligence:  Average   Abstraction:  Normal   Judgement:  Poor   Reality Testing:  Adequate   Insight:  Lacking; Poor   Decision Making:  Impulsive   Social Functioning  Social Maturity:  Isolates   Social Judgement:  Impropriety   Stress  Stressors:  Housing; Office managerinancial   Coping Ability:  Exhausted   Skill Deficits:  None   Supports:  Support needed     Religion: Religion/Spirituality Are You A Religious Person?: No  Leisure/Recreation: Leisure / Recreation Do You Have Hobbies?: No  Exercise/Diet: Exercise/Diet Do You Exercise?: No Have You Gained or Lost A Significant Amount of Weight in the Past Six Months?: No Do You Follow a Special Diet?: No Do You Have Any Trouble Sleeping?: Yes Explanation of Sleeping Difficulties: Patient has difficulty sleeping, slleps 5 hrs per night on average   CCA Employment/Education Employment/Work Situation: Employment / Work Situation Employment situation: Unemployed Patient's job has been impacted by current illness: Yes Describe how patient's job has been impacted: unable to maintain  employment What is the longest time patient has a held a job?: Unknown Where was the patient employed at that time?: Unknown Has patient ever been in the Eli Lilly and Companymilitary?: No  Education: Education Last Grade Completed: 12 Name of [employer]: [place] Did Garment/textile technologistYou Graduate From [employer]High School?: Yes Did Theme park managerYou Attend College?: No Did Designer, television/film setYou Attend Graduate School?: No Did You Have Any Special Interests In School?:  (UTA) Did You Have An Individualized Education Program (IIEP): No Did You Have Any Difficulty At School?: No   CCA Family/Childhood History Family and Relationship History: Family history Marital status: Single Are you sexually active?: Yes What is your sexual orientation?: Homosexual Has your sexual activity been affected by drugs, alcohol, medication, or emotional stress?: Unknown Does patient have children?: No  Childhood History:  Childhood History By whom was/is the patient raised?: Mother,Father Additional childhood history information: parents divorced Description of patient's relationship with caregiver when they were  a child: patient was relative close to his parents growing up Patient's description of current relationship with people who raised him/her: patient states that he currently does not have a close relationship with his parents How were you disciplined when you got in trouble as a child/adolescent?: None reported Does patient have siblings?: Yes Description of patient's current relationship with siblings: patient states that he is not close to his siblings Did patient suffer any verbal/emotional/physical/sexual abuse as a child?: No Did patient suffer from severe childhood neglect?: No Has patient ever been sexually abused/assaulted/raped as an adolescent or adult?: No Was the patient ever a victim of a crime or a disaster?: No Witnessed domestic violence?: No Has patient been affected by domestic violence as an adult?: No  Child/Adolescent Assessment:      CCA Substance Use Alcohol/Drug Use: Alcohol / Drug Use Pain Medications: See MAR Prescriptions: See MAR Over the Counter: See MAR History of alcohol / drug use?: Yes Longest period of sobriety (when/how long): Unable to quantify Negative Consequences of Use: Financial,Personal relationships,Work / School Withdrawal Symptoms: Blackouts,Change in blood pressure,Irritability,Nausea / Vomiting,Patient aware of relationship between substance abuse and physical/medical complications,Seizures,Sweats Onset of Seizures: during withdrawals Date of most recent seizure: [place] Substance #1 Name of Substance 1: Alcohol 1 - Age of First Use: not assessed 1 - Amount (size/oz): 6-7 bottles of wine daily 1 - Frequency: daily 1 - Duration: past few years 1 - Last Use / Amount: 2 bottles of wine and 2 forties/ 1/2 bottle of vodka 1 - Method of Aquiring: purchase from store                       ASAM's:  Six Dimensions of Multidimensional Assessment  Dimension 1:  Acute Intoxication and/or Withdrawal Potential:   Dimension 1:  Description of individual's past and current experiences of substance use and withdrawal: Patient has a history of withdrawal seizures  Dimension 2:  Biomedical Conditions and Complications:   Dimension 2:  Description of patient's biomedical conditions and  complications: Patient has several medical issues negatively impacted by his use of alcohol  Dimension 3:  Emotional, Behavioral, or Cognitive Conditions and Complications:  Dimension 3:  Description of emotional, behavioral, or cognitive conditions and complications: Patient states that he is severely depressed and suicidal  Dimension 4:  Readiness to Change:  Dimension 4:  Description of Readiness to Change criteria: Patient states that he is ready to [employer] positve changes in his life and states that he wants to stop drinking  Dimension 5:  Relapse, Continued use, or Continued Problem Potential:  Dimension 5:   Relapse, continued use, or continued problem potential critiera description: Patient has a history of chronic relapses  Dimension 6:  Recovery/Living Environment:  Dimension 6:  Recovery/Iiving environment criteria description: Patient is hoeless and has little emotional suupport  ASAM Severity Score: ASAM's Severity Rating Score: 13  ASAM Recommended Level of Treatment: ASAM Recommended Level of Treatment: Level III Residential Treatment   Substance use Disorder (SUD) Substance Use Disorder (SUD)  Checklist Symptoms of Substance Use: Continued use despite having a persistent/recurrent physical/psychological problem caused/exacerbated by use,Continued use despite persistent or recurrent social, interpersonal problems, caused or exacerbated by use,Evidence of tolerance,Evidence of withdrawal (Comment),Large amounts of time spent to obtain, use or recover from the substance(s),Persistent desire or unsuccessful efforts to cut down or control use,Presence of craving or strong urge to use,Recurrent use that results in a failure to fulfill major role obligations (work, school,  home),Repeated use in physically hazardous situations,Social, occupational, recreational activities given up or reduced due to use,Substance(s) often taken in larger amounts or over longer times than was intended  Recommendations for Services/Supports/Treatments: Recommendations for Services/Supports/Treatments Recommendations For Services/Supports/Treatments: Residential-Level 3,Inpatient Hospitalization,Individual Therapy,Detox,Medication Management  DSM5 Diagnoses: Patient Active Problem List   Diagnosis Date Noted  . Alcohol withdrawal seizure without complication (HCC)   . 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 Elevated troponin 10/19/2020  . Alcohol withdrawal (HCC) 10/19/2020  . Alcohol abuse   . Suicidal ideation 06/14/2020  . Pancreatitis 06/13/2020  . 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  . Malnutrition  of moderate degree 05/07/2020  . Gastrointestinal hemorrhage   . Alcoholic hepatitis without ascites   . Melena 05/05/2020  . Alcoholic ketoacidosis 05/05/2020  . Thrombocytopenia (HCC) 07/25/2019  . Transaminitis 07/25/2019  . Traumatic subdural hematoma (HCC) 07/02/2019  . Alcohol abuse with intoxication (HCC) 07/02/2019  . 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 Alcohol use disorder, severe, dependence (HCC) 04/16/2019  . 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 HIV disease (HCC) 06/16/2018  . Polysubstance abuse (HCC) 06/16/2018  . Cigarette smoker 06/16/2018    Patient Centered Plan: Patient is on the following Treatment Plan(s):    Referrals to Alternative Service(s): Referred to Alternative Service(s):   Place:   Date:   Time:    Referred to Alternative Service(s):   Place:   Date:   Time:    Referred to Alternative Service(s):   Place:   Date:   Time:    Referred to Alternative Service(s):   Place:   Date:   Time:     Rachel Moulds, [place]

## 2021-04-11 NOTE — ED Provider Notes (Addendum)
Pt's care assumed at 6:30 am.  Pt intoxicated and sleeping until more sober Pt awake at 11:00.  Pt unable to eat.  Pt feels like he is having withdrawal.  Pt given ativan 2mg  IM.     , Elson Areas 04/11/21 1202  Pt reports improvement after ativan.  Pt reports he wants to get urine checked because he has blood in urine when he urinates.  He denies std risk.     06/11/21, PA-C 04/11/21 1434    06/11/21 04/11/21 1444    06/11/21, MD 04/12/21 1030

## 2021-04-11 NOTE — ED Notes (Signed)
Pt dressed out in burgundy scrubs.  

## 2021-04-11 NOTE — ED Notes (Signed)
Patient sleeping. Respirations even and unlabored. O2 sats 95% room air.

## 2021-04-11 NOTE — ED Notes (Signed)
Pt sitting up eating breakfast tray 

## 2021-04-11 NOTE — BH Assessment (Signed)
TTS consult completed 

## 2021-04-11 NOTE — Discharge Instructions (Signed)
Please read the instructions below.  Talk with your primary care provider about any new medications.  You have been treated today with antibiotics to cover gonorrhea and chlamydia.  You will receive a call from the hospital if your test results come back positive. Avoid sexual activity until you know your test results. If your results come back positive, it is important that you inform all of your sexual partners.  Return to the ER for new pain with bowel movements, abdominal pain, fever, swelling/pain in your testicles, or new or worsening symptoms.   Return for recurrent feelings of suicide.

## 2021-04-11 NOTE — ED Notes (Signed)
ED Provider at bedside. 

## 2021-04-11 NOTE — ED Provider Notes (Signed)
Assumed care at shift change change, 3 PM. Plan to follow UA.  If blood is present plan to treat prophylactically for STDs with Rocephin and Doxy.  Not actively suicidal at this time, plan for discharge to home if still no active suicidal ideations. Likely related to alcohol intoxication.  U/A with blood, no bacteria. GC/Ch pending.  Patient evaluated, is resting in the bed, in no acute distress.  He is appropriate and calm.  No current complaints or reported active suicidal ideation.  TTS consult discontinued. Discussed UA results and plan to cover for STD.  He is in agreement with this and is comfortable with discharge.  States he cannot afford any prescriptions.  For this reason we will treat with Rocephin and azithromycin.   Gary [name], [place], PA-C 04/11/21 1719    Sabino Donovan, MD 04/17/21 [phone]

## 2021-04-11 NOTE — ED Notes (Signed)
Patient states he is suicidal, and will use anything to harm hisself. Patient admits to alcohol use.

## 2021-04-11 NOTE — ED Notes (Signed)
Patient is resting comfortably. 

## 2021-04-11 NOTE — ED Notes (Signed)
Pt states he is feeling sick after eating some breakfast.

## 2021-04-11 NOTE — ED Notes (Signed)
Pt belongings located in cabinet behind nurses station.

## 2021-04-11 NOTE — ED Notes (Signed)
Pt given all belongings.

## 2021-04-11 NOTE — ED Notes (Signed)
An After Visit Summary was printed and given to the patient. Discharge instructions given and no further questions at this time.  

## 2021-04-11 NOTE — ED Notes (Signed)
Patient is asleep.  

## 2021-04-11 NOTE — ED Notes (Signed)
Patient is still asleep 

## 2021-04-18 ENCOUNTER — Ambulatory Visit: Payer: Self-pay | Admitting: Infectious Disease

## 2021-04-23 ENCOUNTER — Emergency Department (HOSPITAL_COMMUNITY)
Admission: EM | Admit: 2021-04-23 | Discharge: 2021-04-23 | Disposition: A | Discharge status: Home / Self Care | Payer: Self-pay | Attending: Emergency Medicine | Admitting: Emergency Medicine

## 2021-04-23 DIAGNOSIS — Z20822 Contact with and (suspected) exposure to covid-19: Secondary | ICD-10-CM | POA: Insufficient documentation

## 2021-04-23 DIAGNOSIS — S80862A Insect bite (nonvenomous), left lower leg, initial encounter: Secondary | ICD-10-CM | POA: Insufficient documentation

## 2021-04-23 DIAGNOSIS — F17210 Nicotine dependence, cigarettes, uncomplicated: Secondary | ICD-10-CM | POA: Insufficient documentation

## 2021-04-23 DIAGNOSIS — F10929 Alcohol use, unspecified with intoxication, unspecified: Secondary | ICD-10-CM

## 2021-04-23 DIAGNOSIS — W57XXXA Bitten or stung by nonvenomous insect and other nonvenomous arthropods, initial encounter: Secondary | ICD-10-CM | POA: Insufficient documentation

## 2021-04-23 DIAGNOSIS — F10129 Alcohol abuse with intoxication, unspecified: Secondary | ICD-10-CM | POA: Insufficient documentation

## 2021-04-23 DIAGNOSIS — Y908 Blood alcohol level of 240 mg/100 ml or more: Secondary | ICD-10-CM | POA: Insufficient documentation

## 2021-04-23 DIAGNOSIS — Z21 Asymptomatic human immunodeficiency virus [HIV] infection status: Secondary | ICD-10-CM | POA: Insufficient documentation

## 2021-04-23 DIAGNOSIS — R45851 Suicidal ideations: Secondary | ICD-10-CM | POA: Insufficient documentation

## 2021-04-23 LAB — CBC WITH DIFFERENTIAL/PLATELET
Abs Immature Granulocytes: 0 10*3/uL (ref 0.00–0.07)
Basophils Absolute: 0.1 10*3/uL (ref 0.0–0.1)
Basophils Relative: 2 %
Eosinophils Absolute: 0.1 10*3/uL (ref 0.0–0.5)
Eosinophils Relative: 3 %
HCT: 42.6 % (ref 39.0–52.0)
Hemoglobin: 14.4 g/dL (ref 13.0–17.0)
Immature Granulocytes: 0 %
Lymphocytes Relative: 51 %
Lymphs Abs: 1.9 10*3/uL (ref 0.7–4.0)
MCH: 34 pg (ref 26.0–34.0)
MCHC: 33.8 g/dL (ref 30.0–36.0)
MCV: 100.5 fL — ABNORMAL HIGH (ref 80.0–100.0)
Monocytes Absolute: 0.3 10*3/uL (ref 0.1–1.0)
Monocytes Relative: 8 %
Neutro Abs: 1.4 10*3/uL — ABNORMAL LOW (ref 1.7–7.7)
Neutrophils Relative %: 36 %
Platelets: 248 10*3/uL (ref 150–400)
RBC: 4.24 MIL/uL (ref 4.22–5.81)
RDW: 14.2 % (ref 11.5–15.5)
WBC: 3.8 10*3/uL — ABNORMAL LOW (ref 4.0–10.5)
nRBC: 0 % (ref 0.0–0.2)

## 2021-04-23 LAB — RAPID URINE DRUG SCREEN, HOSP PERFORMED
Amphetamines: NOT DETECTED
Barbiturates: NOT DETECTED
Benzodiazepines: NOT DETECTED
Cocaine: NOT DETECTED
Opiates: NOT DETECTED
Tetrahydrocannabinol: NOT DETECTED

## 2021-04-23 LAB — COMPREHENSIVE METABOLIC PANEL
ALT: 93 U/L — ABNORMAL HIGH (ref 0–44)
AST: 125 U/L — ABNORMAL HIGH (ref 15–41)
Albumin: 4.2 g/dL (ref 3.5–5.0)
Alkaline Phosphatase: 49 U/L (ref 38–126)
Anion gap: 10 (ref 5–15)
BUN: <5 mg/dL — ABNORMAL LOW (ref 6–20)
CO2: 24 mmol/L (ref 22–32)
Calcium: 9.1 mg/dL (ref 8.9–10.3)
Chloride: 104 mmol/L (ref 98–111)
Creatinine, Ser: 0.65 mg/dL (ref 0.61–1.24)
GFR, Estimated: >60 mL/min (ref >60)
Glucose, Bld: 93 mg/dL (ref 70–99)
Potassium: 3.9 mmol/L (ref 3.5–5.1)
Sodium: 138 mmol/L (ref 135–145)
Total Bilirubin: 0.7 mg/dL (ref 0.3–1.2)
Total Protein: 7.7 g/dL (ref 6.5–8.1)

## 2021-04-23 LAB — URINALYSIS, ROUTINE W REFLEX MICROSCOPIC
Bilirubin Urine: NEGATIVE
Glucose, UA: NEGATIVE mg/dL
Hgb urine dipstick: NEGATIVE
Ketones, ur: NEGATIVE mg/dL
Leukocytes,Ua: NEGATIVE
Nitrite: NEGATIVE
Protein, ur: NEGATIVE mg/dL
Specific Gravity, Urine: 1.002 — ABNORMAL LOW (ref 1.005–1.030)
pH: 7 (ref 5.0–8.0)

## 2021-04-23 LAB — ETHANOL: Alcohol, Ethyl (B): 381 mg/dL (ref <10)

## 2021-04-23 LAB — RESP PANEL BY RT-PCR (FLU A&B, COVID) ARPGX2
Influenza A by PCR: NEGATIVE
Influenza B by PCR: NEGATIVE
SARS Coronavirus 2 by RT PCR: NEGATIVE

## 2021-04-23 MED ORDER — LORAZEPAM 2 MG/ML IJ SOLN: 0.0000 mg | Freq: Two times a day (BID) | INTRAMUSCULAR | Status: DC

## 2021-04-23 MED ORDER — THIAMINE HCL 100 MG PO TABS
100.0000 mg | ORAL_TABLET | Freq: Every day | ORAL | Status: DC
  Administered 2021-04-23: 100 mg via ORAL
  Filled 2021-04-23: qty 1

## 2021-04-23 MED ORDER — LORAZEPAM 1 MG PO TABS: 0.0000 mg | ORAL_TABLET | Freq: Two times a day (BID) | ORAL | Status: DC

## 2021-04-23 MED ORDER — DOXYCYCLINE HYCLATE 100 MG PO TABS
200.0000 mg | ORAL_TABLET | Freq: Once | ORAL | Status: AC
  Administered 2021-04-23: 200 mg via ORAL
  Filled 2021-04-23: qty 2

## 2021-04-23 MED ORDER — THIAMINE HCL 100 MG/ML IJ SOLN: 100.0000 mg | Freq: Every day | INTRAMUSCULAR | Status: DC

## 2021-04-23 MED ORDER — LORAZEPAM 1 MG PO TABS
0.0000 mg | ORAL_TABLET | Freq: Four times a day (QID) | ORAL | Status: DC
  Administered 2021-04-23: 2 mg via ORAL
  Filled 2021-04-23: qty 2

## 2021-04-23 MED ORDER — LORAZEPAM 2 MG/ML IJ SOLN: 0.0000 mg | Freq: Four times a day (QID) | INTRAMUSCULAR | Status: DC

## 2021-04-23 NOTE — Discharge Instructions (Addendum)
As we discussed it is strongly encouraged to cut down on your drinking.  Given that you are not interested in trying Librium I would recommend that you titrate down the alcohol that you drink.  Since you drink 540 ounce cans of beer a day I recommend cutting down to 4 over the next week and progressively as we discussed. I have also included some information for resources in the area including some therapists.  Please reach out to them to make an appointment.  Please follow-up with your primary care doctor.  He received prophylaxis against [facility] spotted fever/Lyme disease today in the form of doxycycline as an antibiotic.  It may cause some loose stool and it does increase the risk for sunburn.

## 2021-04-23 NOTE — ED Provider Notes (Signed)
Accepted handoff at shift change from [facility]. Please see prior provider note for more detail.   Briefly: Patient is 35 y.o. patient is a 35 year old male with history of alcoholism well-known to the department.  He is here today for intoxication He answered yes to SI in triage and TTS consultation order was placed in triage however patient denies any SI to me.  States that he needs help with his drinking.  States he is not interested in having Librium but is agreeable to decreasing his alcohol intake drinks approximately 5    40 ounce beers per day.  States that he can cut down to 4 next week and 3 the week after.   Physical Exam  BP 103/68   Pulse (!) 58   Temp 98.7 F (37.1 C)   Resp 16   SpO2 98%   Physical Exam  ED Course/Procedures   Clinical Course as of 04/23/21 1631  Wed Apr 23, 2021  7213 35 year old alcoholic with frequent presentations to the emergency department for alcohol intoxication here by EMS for same.  Complaining of alcohol withdrawal.  Labs show alcohol level to be elevated at 380.  Will need to observe until more sober. [MB]    Clinical Course User Index [MB] Terrilee Files, MD    Procedures Results for orders placed or performed during the hospital encounter of 04/23/21  Resp Panel by RT-PCR (Flu A&B, Covid) Nasopharyngeal Swab   Specimen: Nasopharyngeal Swab; Nasopharyngeal(NP) swabs in vial transport medium  Result Value Ref Range   SARS Coronavirus 2 by RT PCR NEGATIVE NEGATIVE   Influenza A by PCR NEGATIVE NEGATIVE   Influenza B by PCR NEGATIVE NEGATIVE  Comprehensive metabolic panel  Result Value Ref Range   Sodium 138 135 - 145 mmol/L   Potassium 3.9 3.5 - 5.1 mmol/L   Chloride 104 98 - 111 mmol/L   CO2 24 22 - 32 mmol/L   Glucose, Bld 93 70 - 99 mg/dL   BUN <5 (L) 6 - 20 mg/dL   Creatinine, Ser 1.61 0.61 - 1.24 mg/dL   Calcium 9.1 8.9 - 09.6 mg/dL   Total Protein 7.7 6.5 - 8.1 g/dL   Albumin 4.2 3.5 - 5.0 g/dL   AST 045  (H) 15 - 41 U/L   ALT 93 (H) 0 - 44 U/L   Alkaline Phosphatase 49 38 - 126 U/L   Total Bilirubin 0.7 0.3 - 1.2 mg/dL   GFR, Estimated >40 >98 mL/min   Anion gap 10 5 - 15  CBC with Differential  Result Value Ref Range   WBC 3.8 (L) 4.0 - 10.5 K/uL   RBC 4.24 4.22 - 5.81 MIL/uL   Hemoglobin 14.4 13.0 - 17.0 g/dL   HCT 11.9 14.7 - 82.9 %   MCV 100.5 (H) 80.0 - 100.0 fL   MCH 34.0 26.0 - 34.0 pg   MCHC 33.8 30.0 - 36.0 g/dL   RDW 56.2 13.0 - 86.5 %   Platelets 248 150 - 400 K/uL   nRBC 0.0 0.0 - 0.2 %   Neutrophils Relative % 36 %   Neutro Abs 1.4 (L) 1.7 - 7.7 K/uL   Lymphocytes Relative 51 %   Lymphs Abs 1.9 0.7 - 4.0 K/uL   Monocytes Relative 8 %   Monocytes Absolute 0.3 0.1 - 1.0 K/uL   Eosinophils Relative 3 %   Eosinophils Absolute 0.1 0.0 - 0.5 K/uL   Basophils Relative 2 %   Basophils Absolute 0.1 0.0 -  0.1 K/uL   Immature Granulocytes 0 %   Abs Immature Granulocytes 0.00 0.00 - 0.07 K/uL  Urine rapid drug screen (hosp performed)  Result Value Ref Range   Opiates NONE DETECTED NONE DETECTED   Cocaine NONE DETECTED NONE DETECTED   Benzodiazepines NONE DETECTED NONE DETECTED   Amphetamines NONE DETECTED NONE DETECTED   Tetrahydrocannabinol NONE DETECTED NONE DETECTED   Barbiturates NONE DETECTED NONE DETECTED  Urinalysis, Routine w reflex microscopic Urine, Clean Catch  Result Value Ref Range   Color, Urine STRAW (A) YELLOW   APPearance CLEAR CLEAR   Specific Gravity, Urine 1.002 (L) 1.005 - 1.030   pH 7.0 5.0 - 8.0   Glucose, UA NEGATIVE NEGATIVE mg/dL   Hgb urine dipstick NEGATIVE NEGATIVE   Bilirubin Urine NEGATIVE NEGATIVE   Ketones, ur NEGATIVE NEGATIVE mg/dL   Protein, ur NEGATIVE NEGATIVE mg/dL   Nitrite NEGATIVE NEGATIVE   Leukocytes,Ua NEGATIVE NEGATIVE  Ethanol  Result Value Ref Range   Alcohol, Ethyl (B) 381 (HH) <10 mg/dL   No results found.  MDM   Patient has been in the ER for 6 and half hours.  Given that his original blood alcohol was 380  he is probably around 200 now.  Given that he is an alcoholic this is reasonable that he may be sober at this level.  Clinically on my evaluation patient seems sober.  He is ambulating and able to discuss his symptoms with me.  He denies SI HI AVH.  He states he feels safe to go home.  He states that he is very concerned about withdrawal seizures.  I recommended that he slowly taper down on his alcohol use given that he is not interested in Librium  He was given some Ativan earlier today.  He does not appear to be withdrawing presently.  He is not tachycardic, anxious, febrile, diaphoretic, tremulous or significantly agitated is not experiencing any hallucinations auditory or tactile   Will discharge home at this time.  Urine drug screen negative.  Urinalysis unremarkable CMP unremarkable apart from transaminitis consistent with alcohol use.  CBC with mild leukopenia otherwise normal.  Platelets within normal limits.  He did have tick removed by prior provider.  It was engorged therefore 200 mg of doxycycline was given as prophylaxis against tickborne illness.      Gailen Shelter, [place] 04/23/21 1804    Cheryll Cockayne, MD 04/25/21 [phone]

## 2021-04-23 NOTE — ED Notes (Signed)
PT verbalized understanding of discharge paperwork. Bus pass given to pt.

## 2021-04-23 NOTE — ED Provider Notes (Signed)
[facility] EMERGENCY DEPARTMENT Provider Note   CSN: [id number] Arrival date & time: 04/23/21  1124     History Chief Complaint  Patient presents with   Alcohol Intoxication    ADEM [name] is a 35 y.o. male with medical history significant for HIV, EtOH abuse, known to the emergency department presents for evaluation of feeling like he is in alcohol withdrawal.  States his last drink was just PTA.  Admits to drinking 240 ounce beers.  He admits to passive SI without plan.  He feels like he "I want to hurt someone."  He denies any other complaints.  He denies any recent falls or injuries.  Patient here for EtOh frequently, 33 visits in last 6 months. Typically thinks her with WD however is intoxicated. Freq has SI when intoxicated and declines SI after sobering.  Freq visits for agitation.   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   Elevated troponin 10/19/2020   Alcohol withdrawal (HCC) 10/19/2020   Alcohol abuse    Suicidal ideation 06/14/2020   Pancreatitis 06/13/2020   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   Malnutrition of moderate degree 05/07/2020   Gastrointestinal hemorrhage    Alcoholic hepatitis without ascites    Melena 05/05/2020   Alcoholic ketoacidosis 05/05/2020   Thrombocytopenia (HCC) 07/25/2019   Transaminitis 07/25/2019   Traumatic subdural hematoma (HCC) 07/02/2019   Alcohol abuse with intoxication (HCC) 07/02/2019   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   Alcohol use disorder, severe, dependence (HCC) 04/16/2019   Major depressive disorder, 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   HIV disease  (HCC) 06/16/2018   Polysubstance abuse (HCC) 06/16/2018   Cigarette smoker 06/16/2018    Past Surgical History:  Procedure Laterality Date   INCISION AND DRAINAGE PERIRECTAL ABSCESS N/A 09/24/2016   Procedure: IRRIGATION AND DEBRIDEMENT PERIRECTAL ABSCESS;  Surgeon: Ricarda Frame, MD;  Location: [facility] ORS;  Service: General;  Laterality: N/A;   none         Family History  Problem Relation Age of Onset   Alcohol abuse Mother    Alcohol abuse Father     Social History   Tobacco Use   Smoking status: Every Day    Packs/day: 0.50    Pack years: 0.00    Types: Cigarettes   Smokeless tobacco: Never   Tobacco comments:    unable to smoke while incarcerated 6+ months 02/13/20  Vaping Use   Vaping Use: Never used  Substance Use Topics   Alcohol use: Yes    Comment: "I drink all I can"   Drug use: Not Currently    Types: Marijuana, Methamphetamines    Comment: last used 04/10/2019    Home Medications Prior to Admission medications   Medication Sig Start Date End Date Taking? Authorizing Provider  Bictegravir-Emtricitab-Tenofov (BIKTARVY PO) Take 1 tablet by mouth daily.    [provider]  chlordiazePOXIDE (LIBRIUM) 25 MG capsule 50mg  PO TID x 1D, then 25-50mg  PO BID X 1D, then 25-50mg  PO QD X 1D 04/07/21   04/09/21, DO  elvitegravir-cobicistat-emtricitabine-tenofovir (GENVOYA) 150-150-200-10 MG TABS tablet Take 1 tablet by mouth daily with breakfast. 03/19/21  Eliezer Bottom, MD  thiamine 100 MG tablet Take 1 tablet (100 mg total) by mouth daily. 04/08/21   Bobbye Morton, MD  famotidine (PEPCID) 20 MG tablet Take 1 tablet (20 mg total) by mouth 2 (two) times daily. Patient not taking: Reported on 06/01/2019 05/14/19 06/02/19  Benjiman Core, MD    Allergies    Tegretol [carbamazepine] and Caffeine  Review of Systems   Review of Systems  Unable to perform ROS: Other (Alcohol intoxication)  All other systems reviewed and are negative.  Physical Exam Updated  Vital Signs BP 105/70   Pulse (!) 59   Temp 98.7 F (37.1 C)   Resp 17   SpO2 96%   Physical Exam Vitals and nursing note reviewed.  Constitutional:      General: He is not in acute distress.    Appearance: He is well-developed. He is not ill-appearing, toxic-appearing or diaphoretic.  HENT:     Head: Normocephalic and atraumatic.     Mouth/Throat:     Mouth: Mucous membranes are dry.  Eyes:     Pupils: Pupils are equal, round, and reactive to light.  Cardiovascular:     Rate and Rhythm: Normal rate and regular rhythm.     Pulses: Normal pulses.     Heart sounds: Normal heart sounds.  Pulmonary:     Effort: Pulmonary effort is normal. No respiratory distress.     Breath sounds: Normal breath sounds.  Abdominal:     General: There is no distension.     Palpations: Abdomen is soft.  Musculoskeletal:        General: Normal range of motion.     Cervical back: Normal range of motion and neck supple.     Comments: Moves all 4 extremities without difficulty   Skin:    General: Skin is warm and dry.     Comments: Tick to LLE. Pulled by nursing. no target lesion  Neurological:     General: No focal deficit present.     Mental Status: He is alert.     Comments: Slurred speech, appears intoxicated, moves all 4 extremities without difficulty    ED Results / Procedures / Treatments   Labs (all labs ordered are listed, but only abnormal results are displayed) Labs Reviewed  COMPREHENSIVE METABOLIC PANEL - Abnormal; Notable for the following components:      Result Value   BUN <5 (*)    AST 125 (*)    ALT 93 (*)    All other components within normal limits  CBC WITH DIFFERENTIAL/PLATELET - Abnormal; Notable for the following components:   WBC 3.8 (*)    MCV 100.5 (*)    Neutro Abs 1.4 (*)    All other components within normal limits  URINALYSIS, ROUTINE W REFLEX MICROSCOPIC - Abnormal; Notable for the following components:   Color, Urine STRAW (*)    Specific Gravity,  Urine 1.002 (*)    All other components within normal limits  ETHANOL - Abnormal; Notable for the following components:   Alcohol, Ethyl (B) 381 (*)    All other components within normal limits  RESP PANEL BY RT-PCR (FLU A&B, COVID) ARPGX2  RAPID URINE DRUG SCREEN, HOSP PERFORMED    EKG None  Radiology No results found.  Procedures Procedures   Medications Ordered in ED Medications  LORazepam (ATIVAN) injection 0-4 mg ( Intravenous See Alternative 04/23/21 1346)    Or  LORazepam (ATIVAN) tablet 0-4 mg (2 mg Oral Given 04/23/21 1346)  LORazepam (ATIVAN) injection 0-4 mg (has no administration in time range)    Or  LORazepam (ATIVAN) tablet 0-4 mg (has no administration in time range)  thiamine tablet 100 mg (100 mg Oral Given 04/23/21 1348)    Or  thiamine (B-1) injection 100 mg ( Intravenous See Alternative 04/23/21 1348)  doxycycline (VIBRA-TABS) tablet 200 mg (200 mg Oral Given 04/23/21 1414)    ED Course  I have reviewed the triage vital signs and the nursing notes.  Pertinent labs & imaging results that were available during my care of the patient were reviewed by me and considered in my medical decision making (see chart for details).  35 year old well-known to the emergency department with 33 visits in the last 6 months presents for evaluation of concern of alcohol withdrawal.  His last drink was just PTA.  Patient appears obviously intoxicated.  He has injected sclera, smells of alcohol.  Appears disheveled.  Admits to some passive SI without plan.  Patient does have history of aggressive behavior however currently he is sleeping in bed.  Of note did have a tick to his left lower extremity which appeared engorged.  Was pulled off by nursing.  No target lesion however will give empiric dose of doxycycline.  Labs personally reviewed and interpreted:  CBC with WBC at 3.8 Metabolic panel with elevated AST, ALT consistent with EtOH use Negative for infection, UDS  negative Ethanol 381  Patient will need sober and reassessment.  Triage provider put in for CIWA orders.  Nursing subsequently got a CIWA score of 12 and gave patient 2 mg of Ativan.  Patient symptoms likely due to intoxication not withdrawal.   Care transfered to [facility], PA-C who will Fu on reassessment and determine appropriate dispo.    Clinical Course as of 04/23/21 1526  Wed Apr 23, 2021  [id number] 35 year old alcoholic with frequent presentations to the emergency department for alcohol intoxication here by EMS for same.  Complaining of alcohol withdrawal.  Labs show alcohol level to be elevated at 380.  Will need to observe until more sober. [MB]    Clinical Course User Index [MB] Terrilee Files, MD   MDM Rules/Calculators/A&P                           Final Clinical Impression(s) / ED Diagnoses Final diagnoses:  Alcoholic intoxication with complication (HCC)  Tick bite of left lower leg, initial encounter    Rx / DC Orders ED Discharge Orders     None        Tara Rud A, PA-C 04/23/21 1527    Terrilee Files, MD 04/23/21 2101

## 2021-04-23 NOTE — BH Assessment (Signed)
Secure chat request sent to Pt's RN's with request for TTS consult set up

## 2021-04-23 NOTE — ED Provider Notes (Signed)
Emergency Medicine Provider Triage Evaluation Note  Gary [name] , a 35 y.o. male  was evaluated in triage.  Pt complains of intoxicated, scared to go into withdrawal, feels he is a threat to himself and others. Reports drinking 2 40oz beers today.  Review of Systems  Positive: SI Negative:   Physical Exam  There were no vitals taken for this visit. Gen:   Awake, no distress   Resp:  Normal effort  MSK:   Moves extremities without difficulty  Other:    Medical Decision Making  Medically screening exam initiated at 11:22 AM.  Appropriate orders placed.  JAYCE KAINZ was informed that the remainder of the evaluation will be completed by another provider, this initial triage assessment does not replace that evaluation, and the importance of remaining in the ED until their evaluation is complete.     Jeannie Fend, PA-C 04/23/21 1200    Arby Barrette, MD 05/04/21 [phone]

## 2021-04-23 NOTE — BH Assessment (Signed)
Via secure chat, pt's RN reports pt is being discharged by PA.

## 2021-04-23 NOTE — ED Triage Notes (Signed)
Pt bib ems with reports of etoh intoxication. Pt has had 2 40oz today and is scared to detox. Pt told EMS he would need security.

## 2021-04-29 ENCOUNTER — Inpatient Hospital Stay (HOSPITAL_COMMUNITY)
Admission: EM | Admit: 2021-04-29 | Discharge: 2021-05-01 | DRG: 894 | Discharge status: Against Medical Advice | Payer: Self-pay | Attending: Internal Medicine | Admitting: Internal Medicine

## 2021-04-29 ENCOUNTER — Encounter (HOSPITAL_COMMUNITY): Payer: Self-pay

## 2021-04-29 ENCOUNTER — Other Ambulatory Visit: Payer: Self-pay

## 2021-04-29 ENCOUNTER — Emergency Department (HOSPITAL_COMMUNITY): Payer: Self-pay

## 2021-04-29 DIAGNOSIS — F10239 Alcohol dependence with withdrawal, unspecified: Principal | ICD-10-CM | POA: Diagnosis present

## 2021-04-29 DIAGNOSIS — B20 Human immunodeficiency virus [HIV] disease: Secondary | ICD-10-CM | POA: Diagnosis present

## 2021-04-29 DIAGNOSIS — Z20822 Contact with and (suspected) exposure to covid-19: Secondary | ICD-10-CM | POA: Diagnosis present

## 2021-04-29 DIAGNOSIS — Z888 Allergy status to other drugs, medicaments and biological substances status: Secondary | ICD-10-CM

## 2021-04-29 DIAGNOSIS — Y908 Blood alcohol level of 240 mg/100 ml or more: Secondary | ICD-10-CM | POA: Diagnosis present

## 2021-04-29 DIAGNOSIS — D72819 Decreased white blood cell count, unspecified: Secondary | ICD-10-CM | POA: Diagnosis present

## 2021-04-29 DIAGNOSIS — F10920 Alcohol use, unspecified with intoxication, uncomplicated: Secondary | ICD-10-CM

## 2021-04-29 DIAGNOSIS — F10932 Alcohol use, unspecified with withdrawal with perceptual disturbance: Secondary | ICD-10-CM

## 2021-04-29 DIAGNOSIS — Z9114 Patient's other noncompliance with medication regimen: Secondary | ICD-10-CM

## 2021-04-29 DIAGNOSIS — Z811 Family history of alcohol abuse and dependence: Secondary | ICD-10-CM

## 2021-04-29 DIAGNOSIS — F10220 Alcohol dependence with intoxication, uncomplicated: Secondary | ICD-10-CM | POA: Diagnosis present

## 2021-04-29 DIAGNOSIS — F329 Major depressive disorder, single episode, unspecified: Secondary | ICD-10-CM | POA: Diagnosis present

## 2021-04-29 DIAGNOSIS — R569 Unspecified convulsions: Secondary | ICD-10-CM | POA: Diagnosis present

## 2021-04-29 DIAGNOSIS — Z5329 Procedure and treatment not carried out because of patient's decision for other reasons: Secondary | ICD-10-CM | POA: Diagnosis present

## 2021-04-29 DIAGNOSIS — F10232 Alcohol dependence with withdrawal with perceptual disturbance: Secondary | ICD-10-CM

## 2021-04-29 DIAGNOSIS — F17210 Nicotine dependence, cigarettes, uncomplicated: Secondary | ICD-10-CM | POA: Diagnosis present

## 2021-04-29 DIAGNOSIS — F10139 Alcohol abuse with withdrawal, unspecified: Secondary | ICD-10-CM | POA: Diagnosis present

## 2021-04-29 DIAGNOSIS — D696 Thrombocytopenia, unspecified: Secondary | ICD-10-CM | POA: Diagnosis present

## 2021-04-29 DIAGNOSIS — Z79899 Other long term (current) drug therapy: Secondary | ICD-10-CM

## 2021-04-29 HISTORY — DX: Unspecified convulsions: R56.9

## 2021-04-29 LAB — COMPREHENSIVE METABOLIC PANEL
ALT: 105 U/L — ABNORMAL HIGH (ref 0–44)
AST: 141 U/L — ABNORMAL HIGH (ref 15–41)
Albumin: 4 g/dL (ref 3.5–5.0)
Alkaline Phosphatase: 48 U/L (ref 38–126)
Anion gap: 9 (ref 5–15)
BUN: 5 mg/dL — ABNORMAL LOW (ref 6–20)
CO2: 25 mmol/L (ref 22–32)
Calcium: 8.5 mg/dL — ABNORMAL LOW (ref 8.9–10.3)
Chloride: 105 mmol/L (ref 98–111)
Creatinine, Ser: 0.68 mg/dL (ref 0.61–1.24)
GFR, Estimated: >60 mL/min (ref >60)
Glucose, Bld: 88 mg/dL (ref 70–99)
Potassium: 3.4 mmol/L — ABNORMAL LOW (ref 3.5–5.1)
Sodium: 139 mmol/L (ref 135–145)
Total Bilirubin: 0.4 mg/dL (ref 0.3–1.2)
Total Protein: 6.9 g/dL (ref 6.5–8.1)

## 2021-04-29 LAB — I-STAT CHEM 8, ED
BUN: <3 mg/dL — ABNORMAL LOW (ref 6–20)
Calcium, Ion: 1.11 mmol/L — ABNORMAL LOW (ref 1.15–1.40)
Chloride: 104 mmol/L (ref 98–111)
Creatinine, Ser: 1.2 mg/dL (ref 0.61–1.24)
Glucose, Bld: 90 mg/dL (ref 70–99)
HCT: 39 % (ref 39.0–52.0)
Hemoglobin: 13.3 g/dL (ref 13.0–17.0)
Potassium: 3.5 mmol/L (ref 3.5–5.1)
Sodium: 141 mmol/L (ref 135–145)
TCO2: 23 mmol/L (ref 22–32)

## 2021-04-29 LAB — CBC WITH DIFFERENTIAL/PLATELET
Abs Immature Granulocytes: 0 10*3/uL (ref 0.00–0.07)
Basophils Absolute: 0 10*3/uL (ref 0.0–0.1)
Basophils Relative: 1 %
Eosinophils Absolute: 0.1 10*3/uL (ref 0.0–0.5)
Eosinophils Relative: 5 %
HCT: 37.7 % — ABNORMAL LOW (ref 39.0–52.0)
Hemoglobin: 13 g/dL (ref 13.0–17.0)
Immature Granulocytes: 0 %
Lymphocytes Relative: 54 %
Lymphs Abs: 1.6 10*3/uL (ref 0.7–4.0)
MCH: 34.5 pg — ABNORMAL HIGH (ref 26.0–34.0)
MCHC: 34.5 g/dL (ref 30.0–36.0)
MCV: 100 fL (ref 80.0–100.0)
Monocytes Absolute: 0.2 10*3/uL (ref 0.1–1.0)
Monocytes Relative: 5 %
Neutro Abs: 1 10*3/uL — ABNORMAL LOW (ref 1.7–7.7)
Neutrophils Relative %: 35 %
Platelets: 122 10*3/uL — ABNORMAL LOW (ref 150–400)
RBC: 3.77 MIL/uL — ABNORMAL LOW (ref 4.22–5.81)
RDW: 14 % (ref 11.5–15.5)
WBC: 2.9 10*3/uL — ABNORMAL LOW (ref 4.0–10.5)
nRBC: 0 % (ref 0.0–0.2)

## 2021-04-29 LAB — CBG MONITORING, ED: Glucose-Capillary: 86 mg/dL (ref 70–99)

## 2021-04-29 LAB — ETHANOL: Alcohol, Ethyl (B): 315 mg/dL (ref <10)

## 2021-04-29 IMAGING — CT CT HEAD W/O CM
3 series · 15 of 47 positions shown, 18 images · non-contrast
Comparison: CT brain [DATE], CT brain and cervical spine
[DATE]

CLINICAL DATA: Intoxicated seizure

EXAM:
CT HEAD WITHOUT CONTRAST
CT CERVICAL SPINE WITHOUT CONTRAST
TECHNIQUE: Multidetector CT imaging of the head and cervical spine was
performed following the standard protocol without intravenous
contrast. Multiplanar CT image reconstructions of the cervical spine
were also generated.

[Series 1: head wo · axial · 0.47mm/px · z∈[-111,+34]mm · 9 of 35 slices shown, 12 images]
[im 3/35  brain]
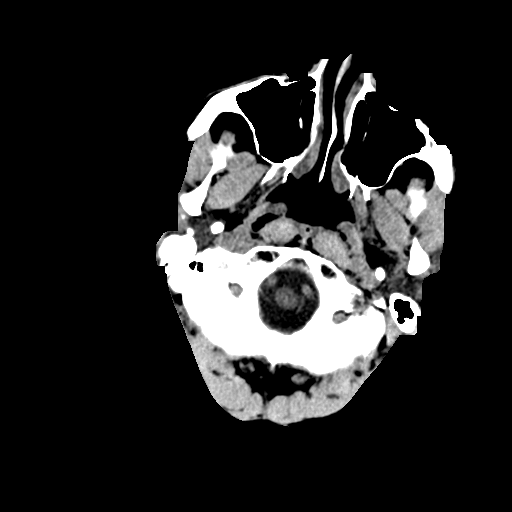
[im 3/35  bone]
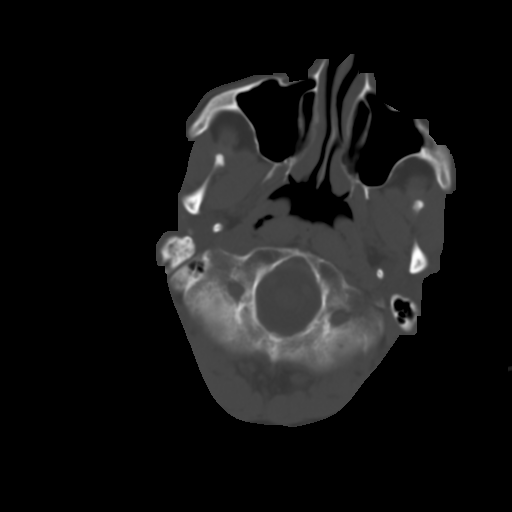
[im 6/35  brain]
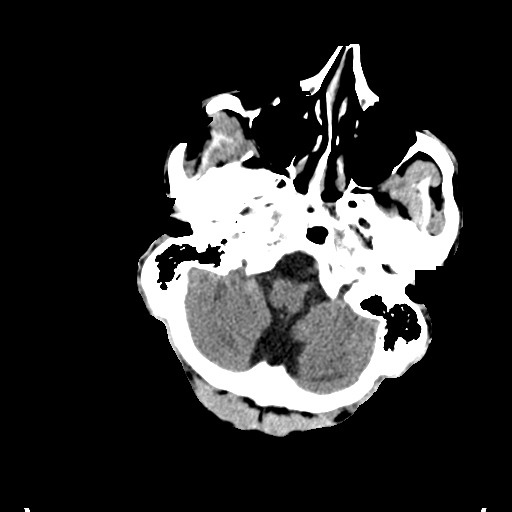
[im 10/35  brain]
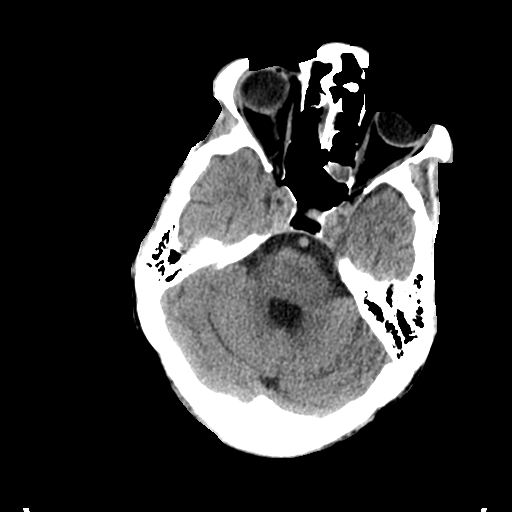
[im 13/35  brain]
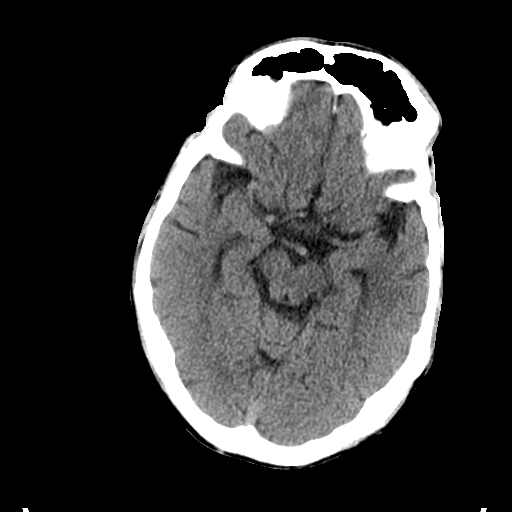
[im 18/35  brain]
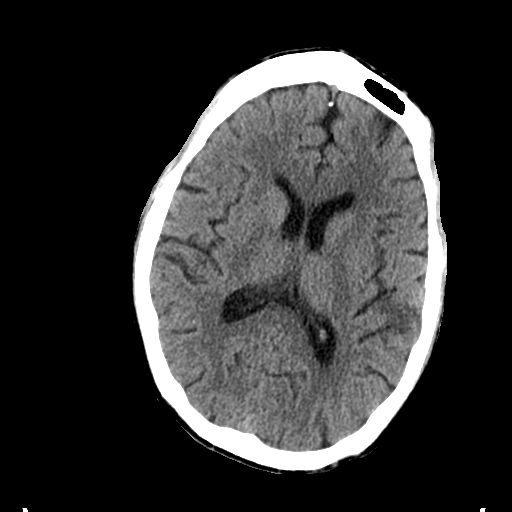
[im 18/35  bone]
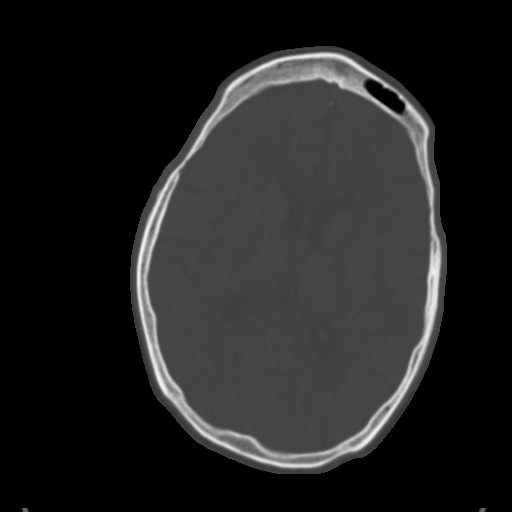
[im 22/35  brain]
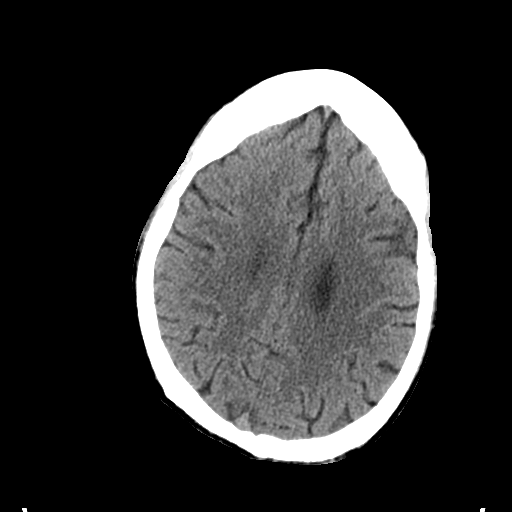
[im 25/35  brain]
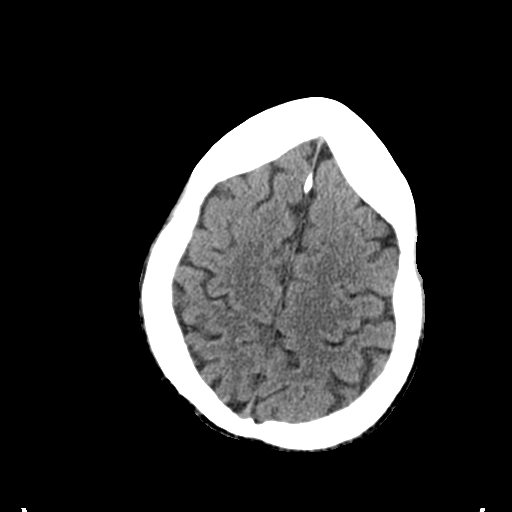
[im 29/35  brain]
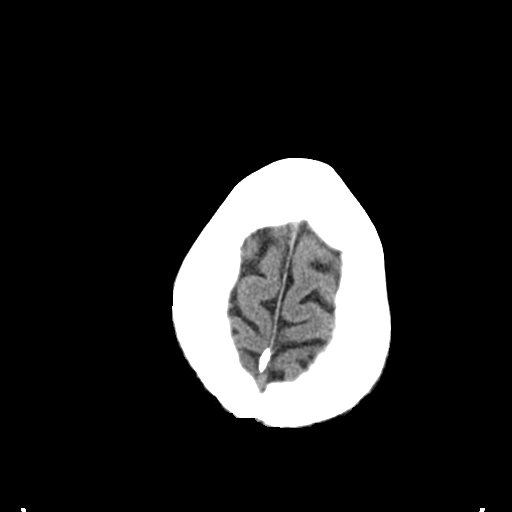
[im 32/35  brain]
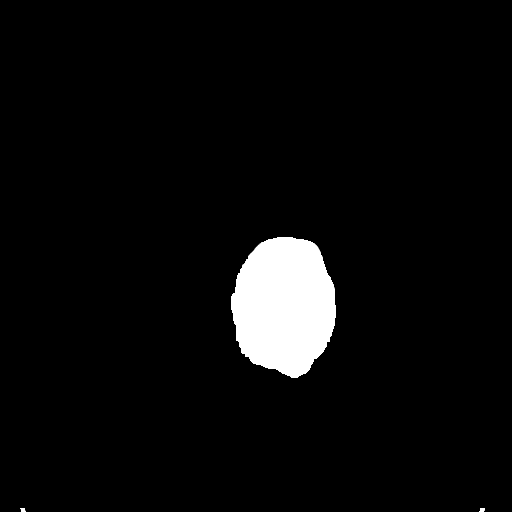
[im 32/35  bone]
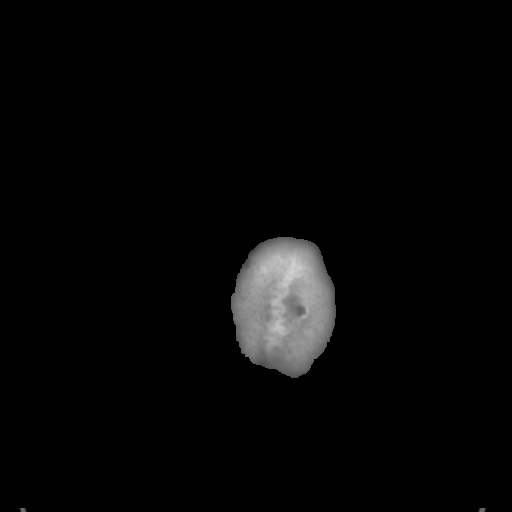

[Series 5: coronal soft tissue · coronal · 0.35mm/px · 3 of 78 slices shown]
[im 26/78  brain]
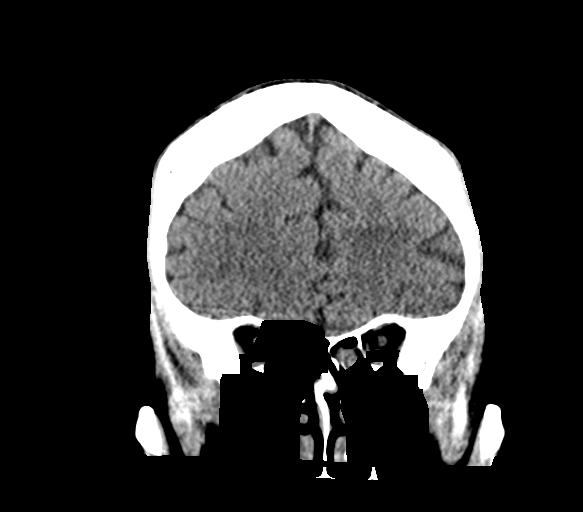
[im 35/78  brain]
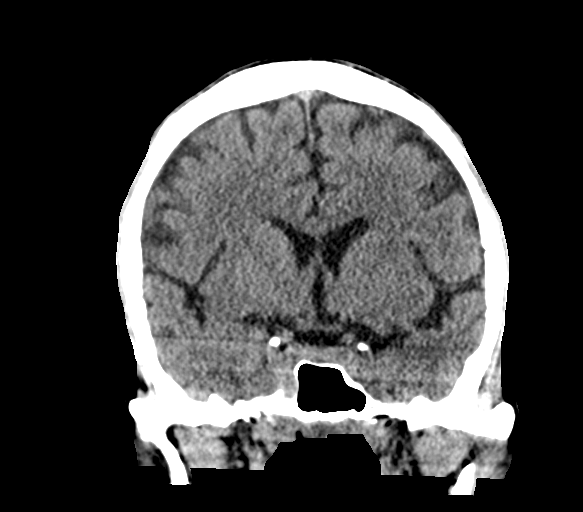
[im 43/78  brain]
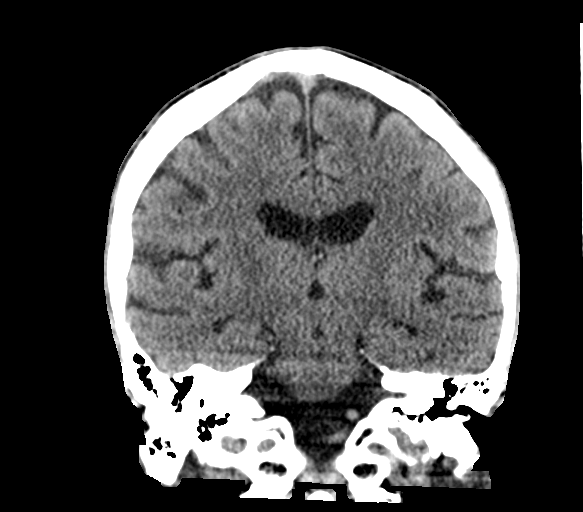

[Series 6: sagittal soft tissue · sagittal · 0.36mm/px · 3 of 60 slices shown]
[im 20/60  brain]
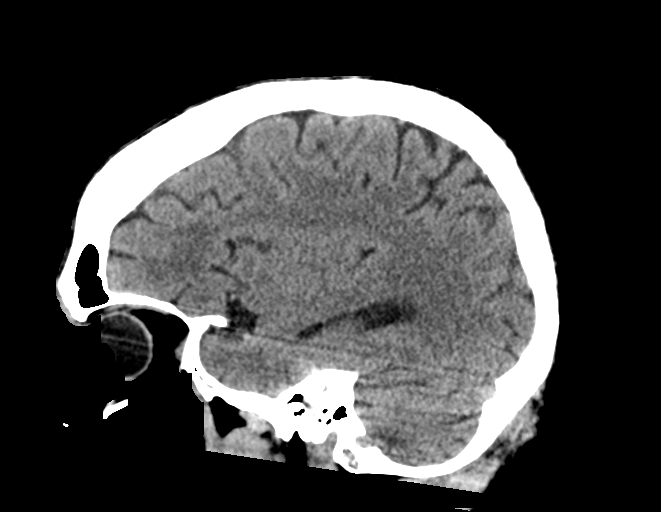
[im 30/60  brain]
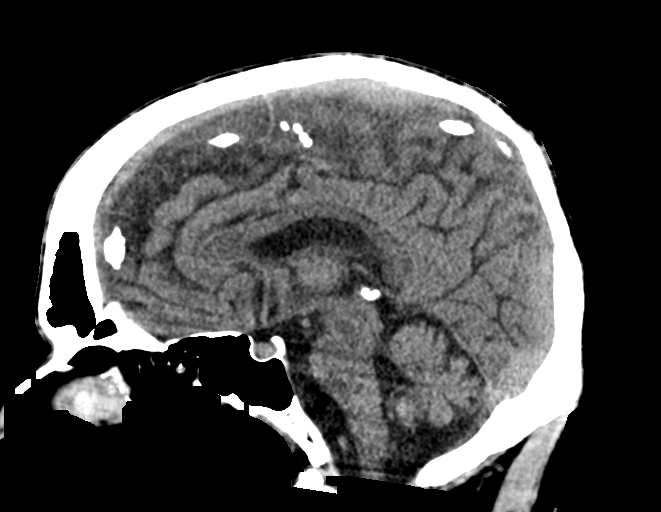
[im 40/60  brain]
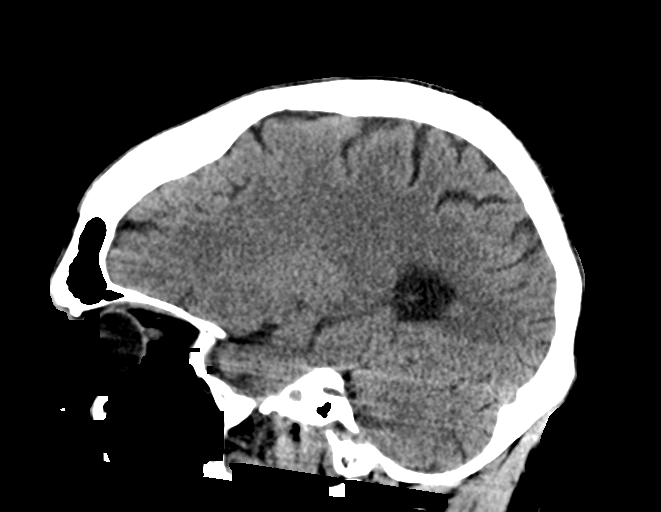

[15 of 47 positions shown; findings below may reference images not displayed]

FINDINGS: CT HEAD FINDINGS

Brain: No acute territorial infarction, hemorrhage, or intracranial
mass. The ventricles are nonenlarged.

Vascular: No hyperdense vessels.  No unexpected calcification

Skull: Normal. Negative for fracture or focal lesion.

Sinuses/Orbits: No acute finding. Patchy mucosal disease in the
ethmoid sinuses.

Other: None

CT CERVICAL SPINE FINDINGS

Alignment: Trace retrolisthesis C3 on C4, C4 on C5 and C5 on C6
without change. Facet alignment is maintained

Skull base and vertebrae: No acute fracture. No primary bone lesion
or focal pathologic process.

Soft tissues and spinal canal: No prevertebral fluid or swelling. No
visible canal hematoma.

Disc levels:  Mild multilevel degenerative changes.

Upper chest: Negative.

Other: None
IMPRESSION: 1. Negative non contrasted CT appearance of the brain
2. No acute osseous abnormality of the cervical spine

## 2021-04-29 IMAGING — CT CT CERVICAL SPINE W/O CM
3 of 4 series · 12 of 33 positions shown, 14 images · non-contrast
Comparison: CT brain [DATE], CT brain and cervical spine
[DATE]

CLINICAL DATA: Intoxicated seizure

EXAM:
CT HEAD WITHOUT CONTRAST
CT CERVICAL SPINE WITHOUT CONTRAST
TECHNIQUE: Multidetector CT imaging of the head and cervical spine was
performed following the standard protocol without intravenous
contrast. Multiplanar CT image reconstructions of the cervical spine
were also generated.

[Series 5: orthogonal bone · axial · 0.23mm/px · z∈[-273,-135]mm · 4 of 106 slices shown, 5 images]
[im 18/106  soft-tissue]
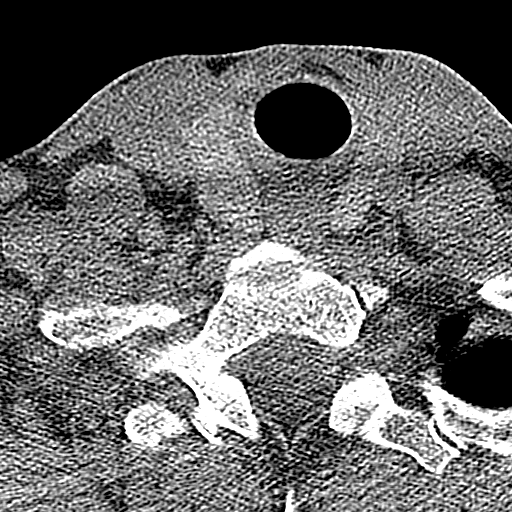
[im 18/106  bone]
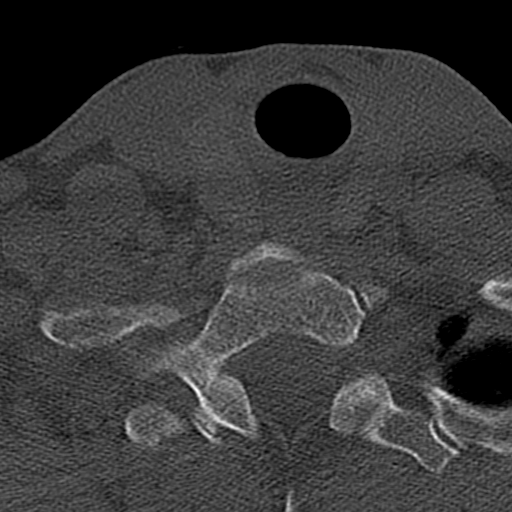
[im 36/106  bone]
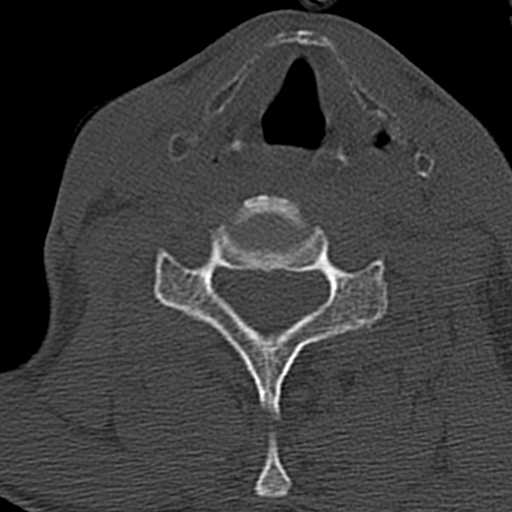
[im 71/106  bone]
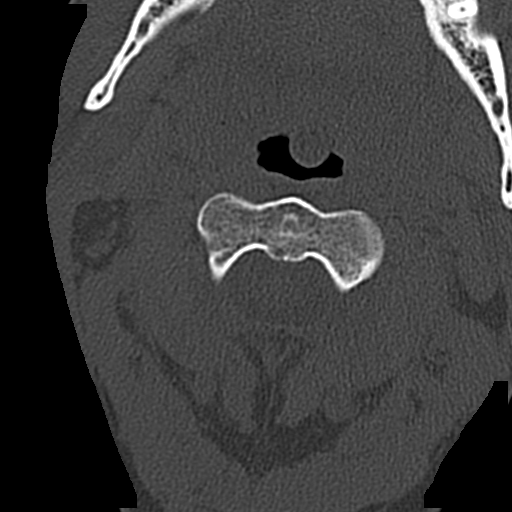
[im 88/106  bone]
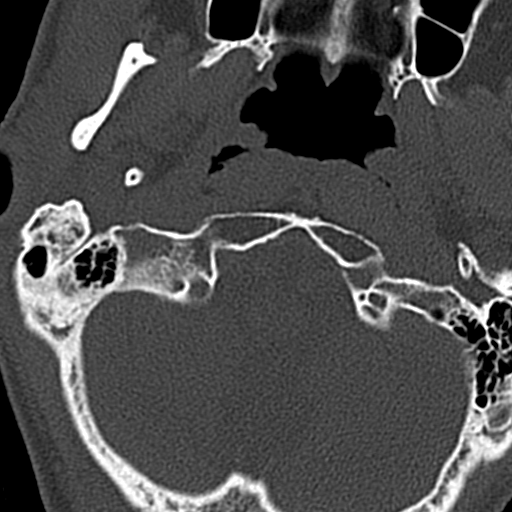

[Series 6: coronal bone · coronal · 0.22mm/px · 3 of 61 slices shown]
[im 13/61  bone]
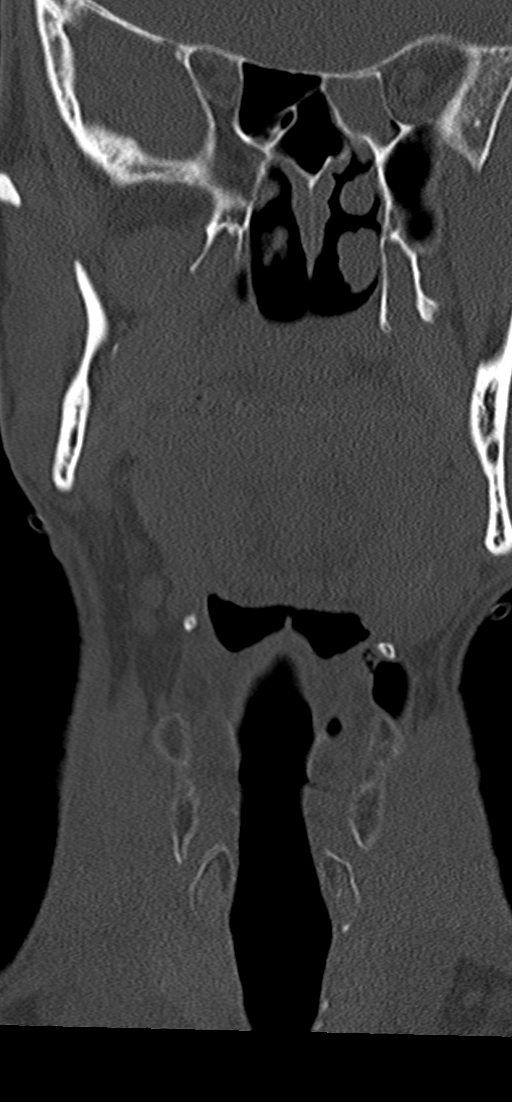
[im 25/61  bone]
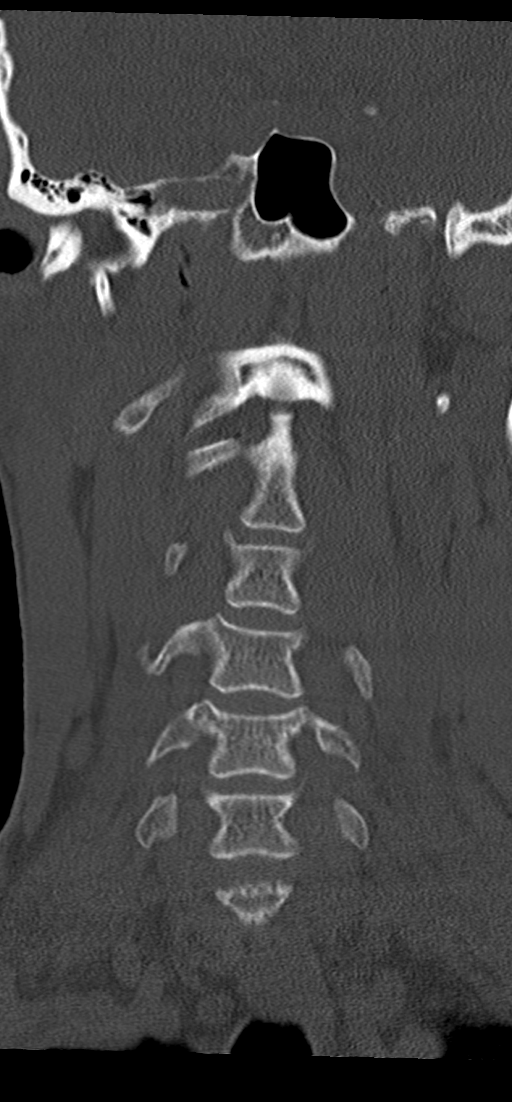
[im 37/61  bone]
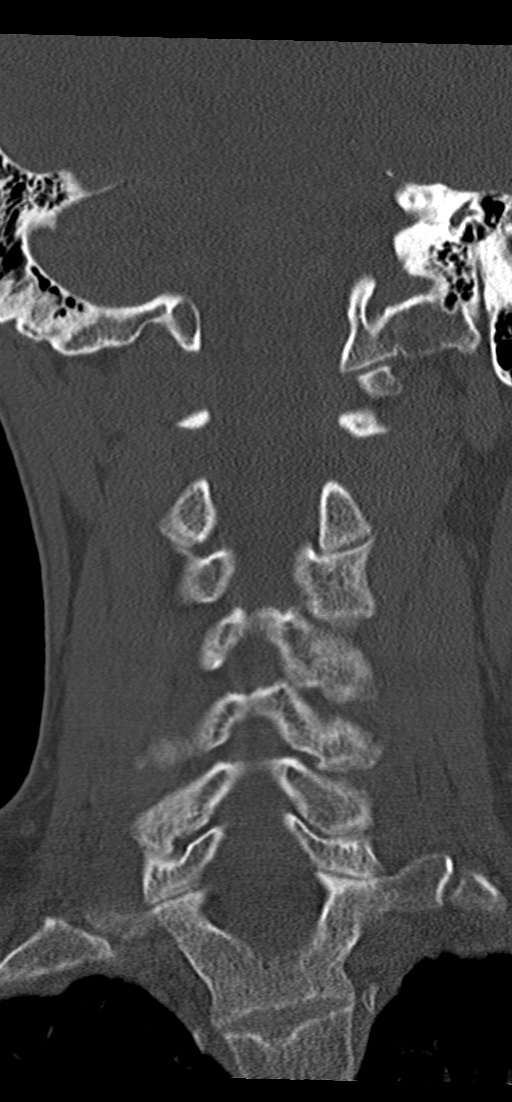

[Series 7: sagittal bone · sagittal · 0.38mm/px · 5 of 61 slices shown, 6 images]
[im 21/61  bone]
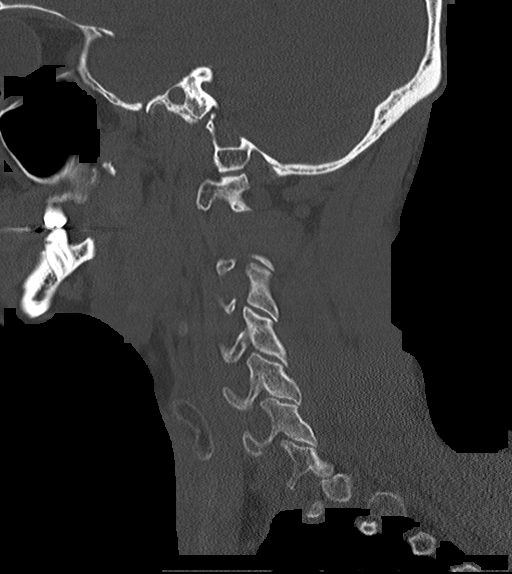
[im 26/61  bone]
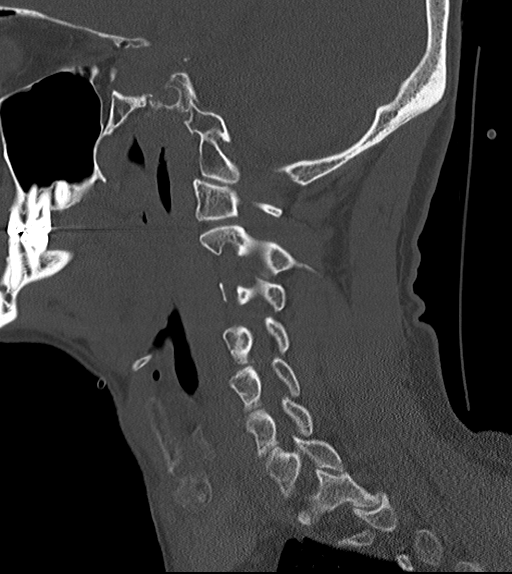
[im 31/61  soft-tissue]
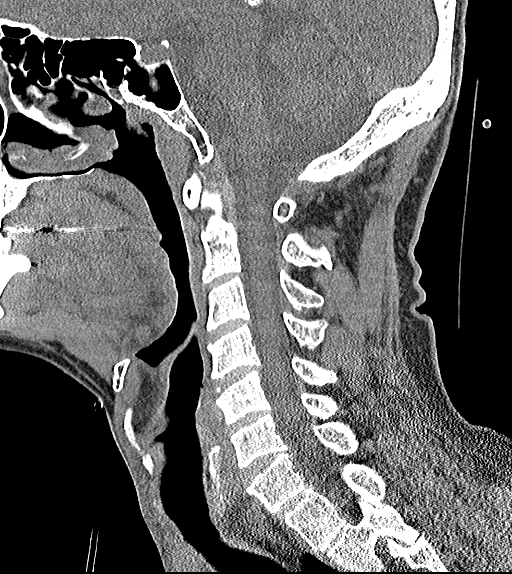
[im 31/61  bone]
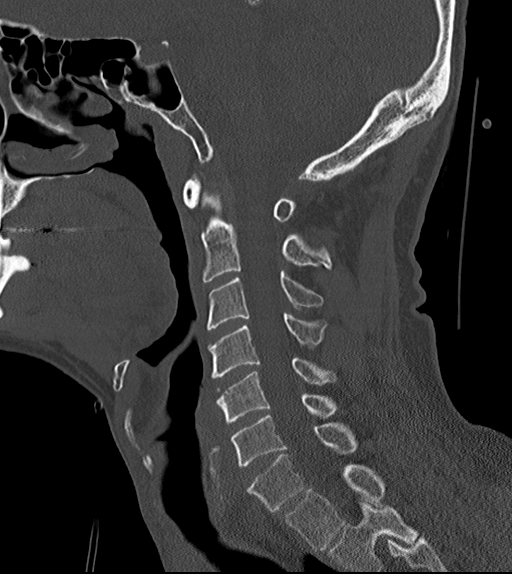
[im 36/61  bone]
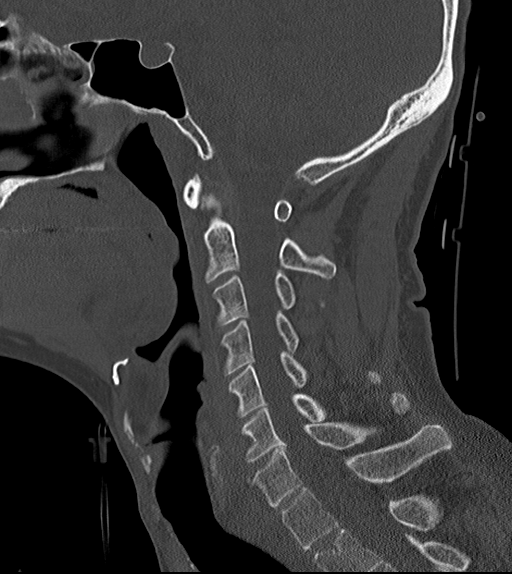
[im 41/61  bone]
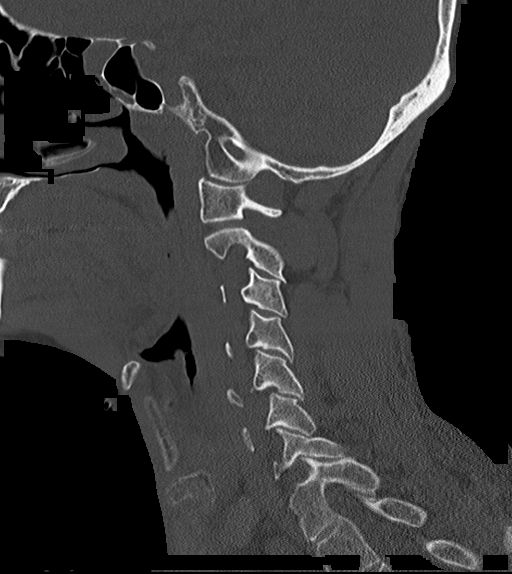

[12 of 33 positions shown; findings below may reference images not displayed]

FINDINGS: CT HEAD FINDINGS

Brain: No acute territorial infarction, hemorrhage, or intracranial
mass. The ventricles are nonenlarged.

Vascular: No hyperdense vessels.  No unexpected calcification

Skull: Normal. Negative for fracture or focal lesion.

Sinuses/Orbits: No acute finding. Patchy mucosal disease in the
ethmoid sinuses.

Other: None

CT CERVICAL SPINE FINDINGS

Alignment: Trace retrolisthesis C3 on C4, C4 on C5 and C5 on C6
without change. Facet alignment is maintained

Skull base and vertebrae: No acute fracture. No primary bone lesion
or focal pathologic process.

Soft tissues and spinal canal: No prevertebral fluid or swelling. No
visible canal hematoma.

Disc levels:  Mild multilevel degenerative changes.

Upper chest: Negative.

Other: None
IMPRESSION: 1. Negative non contrasted CT appearance of the brain
2. No acute osseous abnormality of the cervical spine

## 2021-04-29 MED ORDER — SODIUM CHLORIDE 0.9 % IV BOLUS
1000.0000 mL | Freq: Once | INTRAVENOUS | Status: AC
  Administered 2021-04-29: 1000 mL via INTRAVENOUS

## 2021-04-29 NOTE — ED Provider Notes (Signed)
[facility]-EMERGENCY DEPT Provider Note   CSN: [id number] Arrival date & time: 04/29/21  1830     History No chief complaint on file.   Gary [name] is a 35 y.o. male.  Patient is a 35 year old male who has a history of alcohol abuse, alcohol withdrawal seizures, HIV and prior subdural hematoma.  He presents after having a witnessed seizure [place] bus stop.  Reportedly he endorsed drinking alcohol.  Currently patient appears to be postictal and is not answering questions.  History is limited due to this.  He was brought in by EMS.  C-collar is in place.      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   Malnutrition of moderate degree 05/07/2020   Gastrointestinal hemorrhage    Alcoholic hepatitis without ascites    Melena 05/05/2020   Alcoholic ketoacidosis 05/05/2020   Thrombocytopenia (HCC) 07/25/2019   Transaminitis 07/25/2019   Traumatic subdural hematoma (HCC) 07/02/2019   Alcohol abuse with intoxication (HCC) 07/02/2019   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    Past Surgical History:  Procedure Laterality Date   INCISION AND DRAINAGE PERIRECTAL ABSCESS N/A 09/24/2016   Procedure: IRRIGATION AND DEBRIDEMENT PERIRECTAL ABSCESS;   Surgeon: Ricarda Frameharles Woodham, MD;  Location: [facility] ORS;  Service: General;  Laterality: N/A;   none         Family History  Problem Relation Age of Onset   Alcohol abuse Mother    Alcohol abuse Father     Social History   Tobacco Use   Smoking status: Every Day    Packs/day: 0.50    Pack years: 0.00    Types: Cigarettes   Smokeless tobacco: Never   Tobacco comments:    unable to smoke while incarcerated 6+ months 02/13/20  Vaping Use   Vaping Use: Never used  Substance Use Topics   Alcohol use: Yes    Comment: "I drink all I can"   Drug use: Not Currently    Types: Marijuana, Methamphetamines    Comment: last used 04/10/2019    Home Medications Prior to Admission medications   Medication Sig Start Date End Date Taking? Authorizing Provider  Bictegravir-Emtricitab-Tenofov (BIKTARVY PO) Take 1 tablet by mouth daily.    [provider]  chlordiazePOXIDE (LIBRIUM) 25 MG capsule 50mg  PO TID x 1D, then 25-50mg  PO BID X 1D, then 25-50mg  PO QD X 1D 04/07/21   Melene PlanFloyd, Dan, DO  elvitegravir-cobicistat-emtricitabine-tenofovir (GENVOYA) 150-150-200-10 MG TABS tablet Take 1 tablet by mouth daily with breakfast. 03/19/21   Eliezer BottomAslam, Sadia, MD  thiamine 100 MG tablet Take 1 tablet (100 mg total) by mouth daily. 04/08/21   Bobbye MortonMcQuilla, Jai B, MD  famotidine (PEPCID) 20 MG tablet Take 1 tablet (20 mg total) by mouth 2 (two) times daily. Patient not taking:  Reported on 06/01/2019 05/14/19 06/02/19  Benjiman Core, MD    Allergies    Tegretol [carbamazepine] and Caffeine  Review of Systems   Review of Systems  Unable to perform ROS: Mental status change   Physical Exam Updated Vital Signs BP 104/68   Pulse 68   Temp 98.5 F (36.9 C) (Oral)   Resp 17   SpO2 98%   Physical Exam Constitutional:      Appearance: He is well-developed.  HENT:     Head: Normocephalic and atraumatic.  Eyes:     Pupils: Pupils are equal, round, and reactive to light.  Neck:     Comments: No obvious pain  along the spine Cardiovascular:     Rate and Rhythm: Normal rate and regular rhythm.     Heart sounds: Normal heart sounds.  Pulmonary:     Effort: Pulmonary effort is normal. No respiratory distress.     Breath sounds: Normal breath sounds. No wheezing or rales.  Chest:     Chest wall: No tenderness.  Abdominal:     General: Bowel sounds are normal.     Palpations: Abdomen is soft.     Tenderness: There is no abdominal tenderness. There is no guarding or rebound.  Musculoskeletal:        General: Normal range of motion.     Comments: Small abrasion on a finger  Lymphadenopathy:     Cervical: No cervical adenopathy.  Skin:    General: Skin is warm and dry.     Findings: No rash.  Neurological:     Mental Status: He is alert.     Comments: Patient is awake with eyes open but is staring into space.  He will look at me but will not answer questions.  He seems to be moving all extremities symmetrically    ED Results / Procedures / Treatments   Labs (all labs ordered are listed, but only abnormal results are displayed) Labs Reviewed  CBC WITH DIFFERENTIAL/PLATELET - Abnormal; Notable for the following components:      Result Value   WBC 2.9 (*)    RBC 3.77 (*)    HCT 37.7 (*)    MCH 34.5 (*)    Platelets 122 (*)    Neutro Abs 1.0 (*)    All other components within normal limits  ETHANOL - Abnormal; Notable for the following components:   Alcohol, Ethyl (B) 315 (*)    All other components within normal limits  COMPREHENSIVE METABOLIC PANEL - Abnormal; Notable for the following components:   Potassium 3.4 (*)    BUN 5 (*)    Calcium 8.5 (*)    AST 141 (*)    ALT 105 (*)    All other components within normal limits  I-STAT CHEM 8, ED - Abnormal; Notable for the following components:   BUN <3 (*)    Calcium, Ion 1.11 (*)    All other components within normal limits  CBG MONITORING, ED    EKG EKG Interpretation  Date/Time:  Tuesday April 29 2021 19:10:06  EDT Ventricular Rate:  72 PR Interval:  155 QRS Duration: 108 QT Interval:  409 QTC Calculation: 448 R Axis:   72 Text Interpretation: Sinus rhythm since last tracing no significant change Confirmed by Rolan Bucco [phone]) on 04/29/2021 10:17:18 PM  Radiology CT Head Wo Contrast  Result Date: 04/29/2021 CLINICAL DATA:  Intoxicated seizure EXAM: CT HEAD WITHOUT CONTRAST CT CERVICAL SPINE WITHOUT CONTRAST TECHNIQUE: Multidetector CT imaging of the  head and cervical spine was performed following the standard protocol without intravenous contrast. Multiplanar CT image reconstructions of the cervical spine were also generated. COMPARISON:  CT brain 03/11/2021, CT brain and cervical spine 03/07/2021 FINDINGS: CT HEAD FINDINGS Brain: No acute territorial infarction, hemorrhage, or intracranial mass. The ventricles are nonenlarged. Vascular: No hyperdense vessels.  No unexpected calcification Skull: Normal. Negative for fracture or focal lesion. Sinuses/Orbits: No acute finding. Patchy mucosal disease in the ethmoid sinuses. Other: None CT CERVICAL SPINE FINDINGS Alignment: Trace retrolisthesis C3 on C4, C4 on C5 and C5 on C6 without change. Facet alignment is maintained Skull base and vertebrae: No acute fracture. No primary bone lesion or focal pathologic process. Soft tissues and spinal canal: No prevertebral fluid or swelling. No visible canal hematoma. Disc levels:  Mild multilevel degenerative changes. Upper chest: Negative. Other: None IMPRESSION: 1. Negative non contrasted CT appearance of the brain 2. No acute osseous abnormality of the cervical spine Electronically Signed   By: Jasmine Pang M.D.   On: 04/29/2021 22:19   CT Cervical Spine Wo Contrast  Result Date: 04/29/2021 CLINICAL DATA:  Intoxicated seizure EXAM: CT HEAD WITHOUT CONTRAST CT CERVICAL SPINE WITHOUT CONTRAST TECHNIQUE: Multidetector CT imaging of the head and cervical spine was performed following the standard protocol without  intravenous contrast. Multiplanar CT image reconstructions of the cervical spine were also generated. COMPARISON:  CT brain 03/11/2021, CT brain and cervical spine 03/07/2021 FINDINGS: CT HEAD FINDINGS Brain: No acute territorial infarction, hemorrhage, or intracranial mass. The ventricles are nonenlarged. Vascular: No hyperdense vessels.  No unexpected calcification Skull: Normal. Negative for fracture or focal lesion. Sinuses/Orbits: No acute finding. Patchy mucosal disease in the ethmoid sinuses. Other: None CT CERVICAL SPINE FINDINGS Alignment: Trace retrolisthesis C3 on C4, C4 on C5 and C5 on C6 without change. Facet alignment is maintained Skull base and vertebrae: No acute fracture. No primary bone lesion or focal pathologic process. Soft tissues and spinal canal: No prevertebral fluid or swelling. No visible canal hematoma. Disc levels:  Mild multilevel degenerative changes. Upper chest: Negative. Other: None IMPRESSION: 1. Negative non contrasted CT appearance of the brain 2. No acute osseous abnormality of the cervical spine Electronically Signed   By: Jasmine Pang M.D.   On: 04/29/2021 22:19    Procedures Procedures   Medications Ordered in ED Medications  sodium chloride 0.9 % bolus 1,000 mL (1,000 mLs Intravenous New Bag/Given 04/29/21 2145)    ED Course  I have reviewed the triage vital signs and the nursing notes.  Pertinent labs & imaging results that were available during my care of the patient were reviewed by me and considered in my medical decision making (see chart for details).    MDM Rules/Calculators/A&P                          Patient is a 35 year old male who presents with seizure.  It appears from record review that his seizures are related to his alcohol use.  I do not see that he is on antiepileptic medication.  He appeared to be postictal on arrival.  His labs were checked and his alcohol level is markedly elevated which likely plays a part in his decreased mental  status.  He had a CT scan of his head and cervical spine which showed no acute abnormality.  His other labs are nonconcerning.  His LFTs are elevated but have been elevated in the past as well.  Patient will need  to be observed.  Care turned over to Dr. Preston Fleeting. Final Clinical Impression(s) / ED Diagnoses Final diagnoses:  Alcoholic intoxication without complication [facility])  Seizure [facility])    Rx / DC Orders ED Discharge Orders     None        Rolan Bucco, MD 04/29/21 2332

## 2021-04-29 NOTE — ED Triage Notes (Signed)
Pt to ED by EMS from bos stop following a witnessed seizure, pt has a history of the same. Pt arrives postictal c/o generalized pain. VSS, NADN. Endorses drinking alcohol today.

## 2021-04-30 DIAGNOSIS — F10231 Alcohol dependence with withdrawal delirium: Secondary | ICD-10-CM

## 2021-04-30 LAB — COMPREHENSIVE METABOLIC PANEL
ALT: 105 U/L — ABNORMAL HIGH (ref 0–44)
AST: 137 U/L — ABNORMAL HIGH (ref 15–41)
Albumin: 3.8 g/dL (ref 3.5–5.0)
Alkaline Phosphatase: 48 U/L (ref 38–126)
Anion gap: 10 (ref 5–15)
BUN: <5 mg/dL — ABNORMAL LOW (ref 6–20)
CO2: 25 mmol/L (ref 22–32)
Calcium: 8.7 mg/dL — ABNORMAL LOW (ref 8.9–10.3)
Chloride: 105 mmol/L (ref 98–111)
Creatinine, Ser: 0.5 mg/dL — ABNORMAL LOW (ref 0.61–1.24)
GFR, Estimated: >60 mL/min (ref >60)
Glucose, Bld: 104 mg/dL — ABNORMAL HIGH (ref 70–99)
Potassium: 3.7 mmol/L (ref 3.5–5.1)
Sodium: 140 mmol/L (ref 135–145)
Total Bilirubin: 1.1 mg/dL (ref 0.3–1.2)
Total Protein: 7 g/dL (ref 6.5–8.1)

## 2021-04-30 LAB — MRSA PCR SCREENING: MRSA by PCR: NEGATIVE

## 2021-04-30 LAB — CBC
HCT: 39.5 % (ref 39.0–52.0)
Hemoglobin: 13.3 g/dL (ref 13.0–17.0)
MCH: 34 pg (ref 26.0–34.0)
MCHC: 33.7 g/dL (ref 30.0–36.0)
MCV: 101 fL — ABNORMAL HIGH (ref 80.0–100.0)
Platelets: 113 10*3/uL — ABNORMAL LOW (ref 150–400)
RBC: 3.91 MIL/uL — ABNORMAL LOW (ref 4.22–5.81)
RDW: 13.8 % (ref 11.5–15.5)
WBC: 2.6 10*3/uL — ABNORMAL LOW (ref 4.0–10.5)
nRBC: 0 % (ref 0.0–0.2)

## 2021-04-30 LAB — RESP PANEL BY RT-PCR (FLU A&B, COVID) ARPGX2
Influenza A by PCR: NEGATIVE
Influenza B by PCR: NEGATIVE
SARS Coronavirus 2 by RT PCR: NEGATIVE

## 2021-04-30 LAB — VITAMIN B12: Vitamin B-12: 342 pg/mL (ref 180–914)

## 2021-04-30 LAB — MAGNESIUM: Magnesium: 1.8 mg/dL (ref 1.7–2.4)

## 2021-04-30 LAB — PHOSPHORUS: Phosphorus: 3.7 mg/dL (ref 2.5–4.6)

## 2021-04-30 MED ORDER — LORAZEPAM 1 MG PO TABS: 0.0000 mg | ORAL_TABLET | Freq: Four times a day (QID) | ORAL | Status: DC

## 2021-04-30 MED ORDER — THIAMINE HCL 100 MG/ML IJ SOLN: 100.0000 mg | Freq: Every day | INTRAMUSCULAR | Status: DC

## 2021-04-30 MED ORDER — LEVETIRACETAM IN NACL 1000 MG/100ML IV SOLN
1000.0000 mg | Freq: Once | INTRAVENOUS | Status: AC
  Administered 2021-04-30: 1000 mg via INTRAVENOUS
  Filled 2021-04-30: qty 100

## 2021-04-30 MED ORDER — LORAZEPAM 2 MG/ML IJ SOLN
1.0000 mg | INTRAMUSCULAR | Status: DC | PRN
  Administered 2021-04-30: 1 mg via INTRAVENOUS
  Administered 2021-04-30 – 2021-05-01 (×4): 2 mg via INTRAVENOUS
  Filled 2021-04-30: qty 1
  Filled 2021-04-30: qty 2
  Filled 2021-04-30 (×3): qty 1

## 2021-04-30 MED ORDER — LORAZEPAM 1 MG PO TABS
1.0000 mg | ORAL_TABLET | ORAL | Status: DC | PRN
  Administered 2021-04-30: 2 mg via ORAL
  Filled 2021-04-30 (×2): qty 2

## 2021-04-30 MED ORDER — LORAZEPAM 1 MG PO TABS: 0.0000 mg | ORAL_TABLET | Freq: Two times a day (BID) | ORAL | Status: DC

## 2021-04-30 MED ORDER — LEVETIRACETAM IN NACL 1000 MG/100ML IV SOLN
1000.0000 mg | Freq: Two times a day (BID) | INTRAVENOUS | Status: DC
  Administered 2021-04-30 (×2): 1000 mg via INTRAVENOUS
  Filled 2021-04-30 (×3): qty 100

## 2021-04-30 MED ORDER — CHLORHEXIDINE GLUCONATE CLOTH 2 % EX PADS
6.0000 | MEDICATED_PAD | Freq: Every day | CUTANEOUS | Status: DC
  Administered 2021-04-30: 6 via TOPICAL

## 2021-04-30 MED ORDER — IBUPROFEN 200 MG PO TABS: 600.0000 mg | ORAL_TABLET | Freq: Four times a day (QID) | ORAL | Status: DC | PRN

## 2021-04-30 MED ORDER — LORAZEPAM 2 MG/ML IJ SOLN
0.0000 mg | Freq: Four times a day (QID) | INTRAMUSCULAR | Status: DC
  Administered 2021-04-30: 4 mg via INTRAVENOUS
  Administered 2021-04-30: 2 mg via INTRAVENOUS
  Administered 2021-04-30: 1 mg via INTRAVENOUS
  Administered 2021-04-30: 3 mg via INTRAVENOUS
  Administered 2021-05-01: 1 mg via INTRAVENOUS
  Filled 2021-04-30 (×4): qty 1
  Filled 2021-04-30: qty 2

## 2021-04-30 MED ORDER — ACETAMINOPHEN 325 MG PO TABS
650.0000 mg | ORAL_TABLET | Freq: Four times a day (QID) | ORAL | Status: DC | PRN
  Administered 2021-04-30 (×2): 650 mg via ORAL
  Filled 2021-04-30 (×2): qty 2

## 2021-04-30 MED ORDER — LORAZEPAM 2 MG/ML IJ SOLN: 0.0000 mg | Freq: Two times a day (BID) | INTRAMUSCULAR | Status: DC

## 2021-04-30 MED ORDER — ADULT MULTIVITAMIN W/MINERALS CH
1.0000 | ORAL_TABLET | Freq: Every day | ORAL | Status: DC
  Administered 2021-04-30: 1 via ORAL
  Filled 2021-04-30: qty 1

## 2021-04-30 MED ORDER — PROMETHAZINE HCL 25 MG PO TABS
25.0000 mg | ORAL_TABLET | Freq: Four times a day (QID) | ORAL | Status: DC | PRN
  Administered 2021-04-30 (×2): 25 mg via ORAL
  Filled 2021-04-30 (×2): qty 1

## 2021-04-30 MED ORDER — ORAL CARE MOUTH RINSE
15.0000 mL | Freq: Two times a day (BID) | OROMUCOSAL | Status: DC
  Administered 2021-04-30 (×2): 15 mL via OROMUCOSAL

## 2021-04-30 MED ORDER — PROCHLORPERAZINE EDISYLATE 10 MG/2ML IJ SOLN: 10.0000 mg | Freq: Four times a day (QID) | INTRAMUSCULAR | Status: DC | PRN

## 2021-04-30 MED ORDER — THIAMINE HCL 100 MG PO TABS
100.0000 mg | ORAL_TABLET | Freq: Every day | ORAL | Status: DC
  Administered 2021-04-30: 100 mg via ORAL
  Filled 2021-04-30: qty 1

## 2021-04-30 MED ORDER — THIAMINE HCL 100 MG/ML IJ SOLN
Freq: Once | INTRAVENOUS | Status: AC
  Filled 2021-04-30: qty 1000

## 2021-04-30 MED ORDER — FOLIC ACID 1 MG PO TABS
1.0000 mg | ORAL_TABLET | Freq: Every day | ORAL | Status: DC
  Administered 2021-04-30: 1 mg via ORAL
  Filled 2021-04-30: qty 1

## 2021-04-30 NOTE — H&P (Addendum)
History and Physical    Gary [name] TFT:[id number] DOB: [birth year] DOA: 04/29/2021  PCP: Pcp, No  Patient coming from: Home  Chief Complaint: altered mental status  HPI: Gary [name] is a 35 y.o. male with medical history significant of HIV, EtOH abuse , MDD, polysubstance abuse. Presenting with altered mental status. He reports that he had a seizure this morning. He states that he was drinking late and; apparently, he didn't drink enough. He reports previous history with seizures related to EtOH withdrawal. He denies being on seizure PPX currently. He denies any prodromal symptoms or knowledge of the duration/intensity of the event. He denies any other aggravating or alleviating factors. EMS was called and he was transported to the ED.   ED Course: CTH/c-spine were negative. He was started on keppra and CIWA. TRH was called for admission.   Review of Systems:  Denies CP, palpitations, N/V/D, fevers, dyspnea, abdominal pain. Review of systems is otherwise negative for all not mentioned in HPI.   PMHx Past Medical History:  Diagnosis Date   Alcohol abuse    HIV (human immunodeficiency virus infection) (HCC)    Seizures (HCC)    Subdural hematoma (HCC)     PSHx Past Surgical History:  Procedure Laterality Date   INCISION AND DRAINAGE PERIRECTAL ABSCESS N/A 09/24/2016   Procedure: IRRIGATION AND DEBRIDEMENT PERIRECTAL ABSCESS;  Surgeon: Ricarda Frame, MD;  Location: [facility] ORS;  Service: General;  Laterality: N/A;   none      SocHx  reports that he has been smoking cigarettes. He has been smoking an average of 0.50 packs per day. He has never used smokeless tobacco. He reports current alcohol use. He reports previous drug use. Drugs: Marijuana and Methamphetamines.  Allergies  Allergen Reactions   Tegretol [Carbamazepine] Other (See Comments)    Caused vertigo for 2 days after taking it   Caffeine Palpitations    FamHx Family History  Problem Relation Age of Onset    Alcohol abuse Mother    Alcohol abuse Father     Prior to Admission medications   Medication Sig Start Date End Date Taking? Authorizing Provider  Bictegravir-Emtricitab-Tenofov (BIKTARVY PO) Take 1 tablet by mouth daily.   Yes [provider]  chlordiazePOXIDE (LIBRIUM) 25 MG capsule 50mg  PO TID x 1D, then 25-50mg  PO BID X 1D, then 25-50mg  PO QD X 1D Patient not taking: Reported on 04/30/2021 04/07/21   04/09/21, DO  elvitegravir-cobicistat-emtricitabine-tenofovir (GENVOYA) 150-150-200-10 MG TABS tablet Take 1 tablet by mouth daily with breakfast. Patient not taking: Reported on 04/30/2021 03/19/21   05/19/21, MD  thiamine 100 MG tablet Take 1 tablet (100 mg total) by mouth daily. Patient not taking: Reported on 04/30/2021 04/08/21   04/10/21, MD  famotidine (PEPCID) 20 MG tablet Take 1 tablet (20 mg total) by mouth 2 (two) times daily. Patient not taking: Reported on 06/01/2019 05/14/19 06/02/19  06/04/19, MD    Physical Exam: Vitals:   04/30/21 0600 04/30/21 0630 04/30/21 0700 04/30/21 0715  BP: 111/83 120/79 116/80   Pulse: 70 79 65 87  Resp: 18 16 15 16   Temp:      TempSrc:      SpO2: 100% 100% 98% 100%    General: 35 y.o. male resting in bed in NAD, disheveled appearing Eyes: PERRL, normal sclera ENMT: Nares patent w/o discharge, orophaynx clear, dentition normal, ears w/o discharge/lesions/ulcers Neck: Supple, trachea midline Cardiovascular: RRR, +S1, S2, no m/g/r, equal pulses throughout Respiratory: CTABL, no w/r/r,  normal WOB GI: BS+, NDNT, no masses noted, no organomegaly noted MSK: No e/c/c Neuro: A&O x 3, no focal deficits Psyc: cooperative, shaky, flat affect  Labs on Admission: I have personally reviewed following labs and imaging studies  CBC: Recent Labs  Lab 04/23/21 1201 04/29/21 2054 04/29/21 2105  WBC 3.8* 2.9*  --   NEUTROABS 1.4* 1.0*  --   HGB 14.4 13.0 13.3  HCT 42.6 37.7* 39.0  MCV 100.5* 100.0  --   PLT 248 122*  --     Basic Metabolic Panel: Recent Labs  Lab 04/23/21 1201 04/29/21 2054 04/29/21 2105  NA 138 139 141  K 3.9 3.4* 3.5  CL 104 105 104  CO2 24 25  --   GLUCOSE 93 88 90  BUN <5* 5* <3*  CREATININE 0.65 0.68 1.20  CALCIUM 9.1 8.5*  --    GFR: CrCl cannot be calculated (Unknown ideal weight.). Liver Function Tests: Recent Labs  Lab 04/23/21 1201 04/29/21 2054  AST 125* 141*  ALT 93* 105*  ALKPHOS 49 48  BILITOT 0.7 0.4  PROT 7.7 6.9  ALBUMIN 4.2 4.0   No results for input(s): LIPASE, AMYLASE in the last 168 hours. No results for input(s): AMMONIA in the last 168 hours. Coagulation Profile: No results for input(s): INR, PROTIME in the last 168 hours. Cardiac Enzymes: No results for input(s): CKTOTAL, CKMB, CKMBINDEX, TROPONINI in the last 168 hours. BNP (last 3 results) No results for input(s): PROBNP in the last 8760 hours. HbA1C: No results for input(s): HGBA1C in the last 72 hours. CBG: Recent Labs  Lab 04/29/21 2056  GLUCAP 86   Lipid Profile: No results for input(s): CHOL, HDL, LDLCALC, TRIG, CHOLHDL, LDLDIRECT in the last 72 hours. Thyroid Function Tests: No results for input(s): TSH, T4TOTAL, FREET4, T3FREE, THYROIDAB in the last 72 hours. Anemia Panel: No results for input(s): VITAMINB12, FOLATE, FERRITIN, TIBC, IRON, RETICCTPCT in the last 72 hours. Urine analysis:    Component Value Date/Time   COLORURINE STRAW (A) 04/23/2021 1202   APPEARANCEUR CLEAR 04/23/2021 1202   APPEARANCEUR Clear 11/15/2014 1755   LABSPEC 1.002 (L) 04/23/2021 1202   LABSPEC 1.002 11/15/2014 1755   PHURINE 7.0 04/23/2021 1202   GLUCOSEU NEGATIVE 04/23/2021 1202   GLUCOSEU Negative 11/15/2014 1755   HGBUR NEGATIVE 04/23/2021 1202   BILIRUBINUR NEGATIVE 04/23/2021 1202   BILIRUBINUR Negative 11/15/2014 1755   KETONESUR NEGATIVE 04/23/2021 1202   PROTEINUR NEGATIVE 04/23/2021 1202   NITRITE NEGATIVE 04/23/2021 1202   LEUKOCYTESUR NEGATIVE 04/23/2021 1202   LEUKOCYTESUR  Negative 11/15/2014 1755    Radiological Exams on Admission: CT Head Wo Contrast  Result Date: 04/29/2021 CLINICAL DATA:  Intoxicated seizure EXAM: CT HEAD WITHOUT CONTRAST CT CERVICAL SPINE WITHOUT CONTRAST TECHNIQUE: Multidetector CT imaging of the head and cervical spine was performed following the standard protocol without intravenous contrast. Multiplanar CT image reconstructions of the cervical spine were also generated. COMPARISON:  CT brain 03/11/2021, CT brain and cervical spine 03/07/2021 FINDINGS: CT HEAD FINDINGS Brain: No acute territorial infarction, hemorrhage, or intracranial mass. The ventricles are nonenlarged. Vascular: No hyperdense vessels.  No unexpected calcification Skull: Normal. Negative for fracture or focal lesion. Sinuses/Orbits: No acute finding. Patchy mucosal disease in the ethmoid sinuses. Other: None CT CERVICAL SPINE FINDINGS Alignment: Trace retrolisthesis C3 on C4, C4 on C5 and C5 on C6 without change. Facet alignment is maintained Skull base and vertebrae: No acute fracture. No primary bone lesion or focal pathologic process. Soft tissues and spinal canal:  No prevertebral fluid or swelling. No visible canal hematoma. Disc levels:  Mild multilevel degenerative changes. Upper chest: Negative. Other: None IMPRESSION: 1. Negative non contrasted CT appearance of the brain 2. No acute osseous abnormality of the cervical spine Electronically Signed   By: Jasmine Pang M.D.   On: 04/29/2021 22:19   CT Cervical Spine Wo Contrast  Result Date: 04/29/2021 CLINICAL DATA:  Intoxicated seizure EXAM: CT HEAD WITHOUT CONTRAST CT CERVICAL SPINE WITHOUT CONTRAST TECHNIQUE: Multidetector CT imaging of the head and cervical spine was performed following the standard protocol without intravenous contrast. Multiplanar CT image reconstructions of the cervical spine were also generated. COMPARISON:  CT brain 03/11/2021, CT brain and cervical spine 03/07/2021 FINDINGS: CT HEAD FINDINGS Brain:  No acute territorial infarction, hemorrhage, or intracranial mass. The ventricles are nonenlarged. Vascular: No hyperdense vessels.  No unexpected calcification Skull: Normal. Negative for fracture or focal lesion. Sinuses/Orbits: No acute finding. Patchy mucosal disease in the ethmoid sinuses. Other: None CT CERVICAL SPINE FINDINGS Alignment: Trace retrolisthesis C3 on C4, C4 on C5 and C5 on C6 without change. Facet alignment is maintained Skull base and vertebrae: No acute fracture. No primary bone lesion or focal pathologic process. Soft tissues and spinal canal: No prevertebral fluid or swelling. No visible canal hematoma. Disc levels:  Mild multilevel degenerative changes. Upper chest: Negative. Other: None IMPRESSION: 1. Negative non contrasted CT appearance of the brain 2. No acute osseous abnormality of the cervical spine Electronically Signed   By: Jasmine Pang M.D.   On: 04/29/2021 22:19    EKG: Independently reviewed. Sinus, no st elevations  Assessment/Plan EtOH abuse/withdrawal EtOH withdrawal seizures     - admit to inpatient, SDU     - apparently had witness seizure at a bus stop     - he reports a known history w/ seizures related to EtOH abuse; says, "I guess I didn't drink enough to keep the seizure away"     - continue keppra 1000mg  BID, titrate as necessary     - CIWA protocol; banana bag x 1, thiamine/folate/MVI     - check thiamine, B12 levels  HIV     - non-compliant on HIV meds; needs outpt follow up with ID  Hx of MDD     - denies being on any current therapy     - will need outpt follow up with behavioral health  Thrombocytopenia Leukopenia     - no evidence of bleed, follow     - no evidence of infection, follow     - non-compliant on HIV meds; needs outpt follow up with ID  DVT prophylaxis: lovenox  Code Status: FULL  Family Communication: None at beside  Consults called: None   Status is: Inpatient  Remains inpatient appropriate because:Inpatient level  of care appropriate due to severity of illness  Dispo: The patient is from: Home              Anticipated d/c is to: Home              Patient currently is not medically stable to d/c.   Difficult to place patient No  DO [facility]  If 7PM-7AM, please contact night-coverage [email or web address]  04/30/2021, 7:17 AM

## 2021-04-30 NOTE — Progress Notes (Signed)
Pt's HR brady in the 50's but getting as low as 48 BPM. Hospitalist notified and EKG obtained and placed on chart. Pt denies chest pain or discomfort. Will continue to monitor.

## 2021-04-30 NOTE — ED Provider Notes (Signed)
Care assumed from Dr. Fredderick Phenix, patient with alcohol intoxication and seizure, negative CT of head.  He would need to be observed until he regains normal mental status.  He is given a loading dose of levetiracetam.  Patient started to become tremulous and felt like he was going through alcohol withdrawal.  He was started on CIWA protocol.  After 6 hours on CIWA protocol, patient is still very tremulous and is having hallucinations of things crawling on his skin.  Vital signs are stable, but I feel he will need to be admitted for his alcohol withdrawal.  Case is discussed with Dr. Ronaldo Miyamoto of [facility], who agrees to admit the patient.   Dione Booze, MD 04/30/21 [phone]

## 2021-05-01 LAB — COMPREHENSIVE METABOLIC PANEL
ALT: 91 U/L — ABNORMAL HIGH (ref 0–44)
AST: 92 U/L — ABNORMAL HIGH (ref 15–41)
Albumin: 4 g/dL (ref 3.5–5.0)
Alkaline Phosphatase: 50 U/L (ref 38–126)
Anion gap: 8 (ref 5–15)
BUN: 6 mg/dL (ref 6–20)
CO2: 26 mmol/L (ref 22–32)
Calcium: 9.4 mg/dL (ref 8.9–10.3)
Chloride: 101 mmol/L (ref 98–111)
Creatinine, Ser: 0.67 mg/dL (ref 0.61–1.24)
GFR, Estimated: >60 mL/min (ref >60)
Glucose, Bld: 84 mg/dL (ref 70–99)
Potassium: 3.5 mmol/L (ref 3.5–5.1)
Sodium: 135 mmol/L (ref 135–145)
Total Bilirubin: 1.7 mg/dL — ABNORMAL HIGH (ref 0.3–1.2)
Total Protein: 6.9 g/dL (ref 6.5–8.1)

## 2021-05-01 LAB — CBC
HCT: 40.4 % (ref 39.0–52.0)
Hemoglobin: 13.6 g/dL (ref 13.0–17.0)
MCH: 34.1 pg — ABNORMAL HIGH (ref 26.0–34.0)
MCHC: 33.7 g/dL (ref 30.0–36.0)
MCV: 101.3 fL — ABNORMAL HIGH (ref 80.0–100.0)
Platelets: 111 10*3/uL — ABNORMAL LOW (ref 150–400)
RBC: 3.99 MIL/uL — ABNORMAL LOW (ref 4.22–5.81)
RDW: 13.3 % (ref 11.5–15.5)
WBC: 3.3 10*3/uL — ABNORMAL LOW (ref 4.0–10.5)
nRBC: 0 % (ref 0.0–0.2)

## 2021-05-01 NOTE — Progress Notes (Signed)
Pt removed monitoring equipment, got out of bed, and got dressed in his clothes. Pt states that he does not want to stay here in the hospital any more. Pt is alert and oriented. Charge Nurse Jule Ser convinced patient to stay at this time. NP Garner Nash aware at bedside.

## 2021-05-01 NOTE — Discharge Summary (Signed)
AMA DISCHARGE NOTICE  Early this morning, patient left AMA. He was counseled against this action by overnight call. He sign AMA forms and left. See On call and Nursing notes for full details.   Final Dx EtOH abuse/withdrawal EtOH withdrawal seizures HIV Hx of MDD Thrombocytopenia Leukopenia  Teddy Spike, DO

## 2021-05-01 NOTE — Progress Notes (Signed)
    Overnight Event - AMA  Patient at this time expresses desire to leave the Hospital immidiately, patient has been warned that this is not Medically advisable at this time, and can result in Medical complications like Death and Disability, patient understands and accepts the risks involved and assumes full responsibilty of this decision.  He is awake and oriented x 4 at this time.   Chinita Greenland BSN MSNA MSN ACNPC-AG Acute Care Nurse Practitioner [facility]   on 05/01/2021 at 5:51 AM

## 2021-05-01 NOTE — Progress Notes (Signed)
Pt left AMA. Pt signed AMA form prior to leaving. NP Garner Nash at bedside and aware. Pt alert and oriented. Peripheral IVs removed prior to leaving.

## 2021-05-12 LAB — VITAMIN B1: Vitamin B1 (Thiamine): 427 nmol/L — ABNORMAL HIGH (ref 66.5–200.0)

## 2021-05-13 ENCOUNTER — Telehealth: Payer: Self-pay

## 2021-05-13 ENCOUNTER — Other Ambulatory Visit: Payer: Self-pay | Admitting: Pharmacist

## 2021-05-13 DIAGNOSIS — B20 Human immunodeficiency virus [HIV] disease: Secondary | ICD-10-CM

## 2021-05-13 MED ORDER — BIKTARVY 50-200-25 MG PO TABS: 1.0000 | ORAL_TABLET | Freq: Every day | ORAL | 2 refills | Status: DC

## 2021-05-13 NOTE — Progress Notes (Signed)
Sending in [place] [facility].

## 2021-05-13 NOTE — Telephone Encounter (Signed)
Sure thing. Refills sent to [facility].
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
## 2021-05-13 NOTE — Telephone Encounter (Signed)
Patient returning call, advised him refills have been sent to [facility] on [address]. Patient verbalized understanding and has no further questions.   Sandie Ano, RN
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
## 2021-05-22 ENCOUNTER — Ambulatory Visit: Payer: Self-pay | Admitting: Infectious Disease

## 2021-06-17 ENCOUNTER — Ambulatory Visit: Payer: Self-pay | Admitting: Infectious Disease

## 2021-06-21 ENCOUNTER — Other Ambulatory Visit: Payer: Self-pay

## 2021-06-21 ENCOUNTER — Encounter (HOSPITAL_COMMUNITY): Payer: Self-pay | Admitting: Emergency Medicine

## 2021-06-21 ENCOUNTER — Ambulatory Visit (HOSPITAL_COMMUNITY)
Admission: EM | Admit: 2021-06-21 | Discharge: 2021-06-21 | Disposition: A | Discharge status: Other Acute Inpatient Hospital | Payer: No Payment, Other | Attending: Psychiatry | Admitting: Psychiatry

## 2021-06-21 ENCOUNTER — Emergency Department (HOSPITAL_COMMUNITY)
Admission: EM | Admit: 2021-06-21 | Discharge: 2021-06-21 | Disposition: A | Discharge status: Home / Self Care | Payer: Self-pay | Attending: Emergency Medicine | Admitting: Emergency Medicine

## 2021-06-21 DIAGNOSIS — F17210 Nicotine dependence, cigarettes, uncomplicated: Secondary | ICD-10-CM | POA: Insufficient documentation

## 2021-06-21 DIAGNOSIS — F10229 Alcohol dependence with intoxication, unspecified: Secondary | ICD-10-CM

## 2021-06-21 DIAGNOSIS — F10920 Alcohol use, unspecified with intoxication, uncomplicated: Secondary | ICD-10-CM

## 2021-06-21 DIAGNOSIS — F10120 Alcohol abuse with intoxication, uncomplicated: Secondary | ICD-10-CM | POA: Insufficient documentation

## 2021-06-21 DIAGNOSIS — Z5900 Homelessness unspecified: Secondary | ICD-10-CM | POA: Insufficient documentation

## 2021-06-21 DIAGNOSIS — R440 Auditory hallucinations: Secondary | ICD-10-CM | POA: Insufficient documentation

## 2021-06-21 DIAGNOSIS — R441 Visual hallucinations: Secondary | ICD-10-CM | POA: Insufficient documentation

## 2021-06-21 DIAGNOSIS — R454 Irritability and anger: Secondary | ICD-10-CM | POA: Insufficient documentation

## 2021-06-21 DIAGNOSIS — R443 Hallucinations, unspecified: Secondary | ICD-10-CM | POA: Insufficient documentation

## 2021-06-21 DIAGNOSIS — W01198A Fall on same level from slipping, tripping and stumbling with subsequent striking against other object, initial encounter: Secondary | ICD-10-CM | POA: Insufficient documentation

## 2021-06-21 DIAGNOSIS — Z21 Asymptomatic human immunodeficiency virus [HIV] infection status: Secondary | ICD-10-CM | POA: Insufficient documentation

## 2021-06-21 DIAGNOSIS — R45850 Homicidal ideations: Secondary | ICD-10-CM | POA: Insufficient documentation

## 2021-06-21 DIAGNOSIS — S0081XA Abrasion of other part of head, initial encounter: Secondary | ICD-10-CM | POA: Insufficient documentation

## 2021-06-21 NOTE — ED Provider Notes (Signed)
Behavioral Health Urgent Care Medical Screening Exam  Patient Name: Gary [name] MRN: [id number] Date of Evaluation: 06/21/21 Chief Complaint:  I do not feel safe Diagnosis:  Final diagnoses:  Alcohol use with uncomplicated intoxication with moderate or severe use disorder (HCC)    History of Present illness: Gary [name] is a 35 y.o. single male homeless with a history of alcohol use disorder presenting at the [facility] voluntarily via [employer].  This is patient's 31st ED visit in the last 6 months.  Patient stated that he told [employer]'s and bring him to [facility] because he did not feel safe. Patient is very guarded, paranoid, agitated and somewhat hostile during the assessment.  During the assessment patient would comment to this writer, "I do not like your questions". patient stated that he had two 40 ounce beers to drink today. Patient denies any illicit substances. Patient is speech is slightly slurred and a little garbled.  Patient endorses having auditory and visual hallucinations and states that he sees "characters and a very large man".  When patient denies being on any medication and states that he does not want to take any medications.  Patient reports that he has had audiovisual hallucinations since childhood.  When patient is asked, if he does not want medicine how can we help him, patient stated he does not know.  Patient denies any SI but does endorse HI.  When asked to describe his HI, patient stated, "I do not want to incriminate myself". Patient will be transferred to [facility] ED due to alcohol intoxication for safety, crisis stabilization and continuous assessment and reassessed in the morning.   Psychiatric Specialty Exam  Presentation  General Appearance:Disheveled  Eye Contact:Good  Speech:Garbled; Slurred  Speech Volume:Decreased  Handedness:Right   Mood and Affect  Mood:Irritable; Angry  Affect:Flat; Blunt   Thought Process  Thought  Processes:Disorganized  Descriptions of Associations:Tangential  Orientation:Partial  Thought Content:Tangential; Paranoid Ideation  Diagnosis of Schizophrenia or Schizoaffective disorder in past: No  Duration of Psychotic Symptoms: Less than six months  Hallucinations:Visual I see characters and a very large man  Ideas of Reference:Paranoia  Suicidal Thoughts:No With Plan ("drink myself to death")  Homicidal Thoughts:Yes, Active Without Intent; Without Plan (patient stated he did not want to incrminate himself)   Sensorium  Memory:Immediate Poor; Recent Poor; Remote Poor  Judgment:Impaired  Insight:Shallow   Executive Functions  Concentration:Poor  Attention Span:Fair  Recall:Poor  Fund of Knowledge:Fair  Language:Fair   Psychomotor Activity  Psychomotor Activity:Tremor   Assets  Assets:Resilience   Sleep  Sleep:Fair  Number of hours: -1   No data recorded  Physical Exam: Physical Exam HENT:     Head: Normocephalic and atraumatic.  Eyes:     Pupils: Pupils are equal, round, and reactive to light.  Cardiovascular:     Rate and Rhythm: Normal rate.  Pulmonary:     Effort: Pulmonary effort is normal.  Neurological:     Mental Status: He is alert and oriented to person, place, and time.  Psychiatric:        Attention and Perception: He perceives auditory and visual hallucinations.        Mood and Affect: Affect is blunt, flat and angry.        Speech: Speech is slurred.        Behavior: Behavior is agitated and combative.        Thought Content: Thought content is paranoid. Thought content includes homicidal ideation.  Cognition and Memory: Memory is impaired.        Judgment: Judgment is impulsive.   Review of Systems  Constitutional: Negative.   HENT: Negative.    Eyes: Negative.   Respiratory: Negative.    Cardiovascular: Negative.   Gastrointestinal: Negative.   Genitourinary: Negative.   Musculoskeletal: Negative.   Skin:  Negative.   Neurological: Negative.   Psychiatric/Behavioral:  Positive for hallucinations.   Blood pressure 114/82, pulse 93, temperature 98.6 F (37 C), temperature source Oral, resp. rate 18, SpO2 99 %. There is no height or weight on file to calculate BMI.  Musculoskeletal: Strength & Muscle Tone: decreased Gait & Station: unsteady Patient leans: N/A   [facility] MSE Discharge Disposition for Follow up and Recommendations: Based on my evaluation the patient appears to have an emergency medical condition for which I recommend the patient be transferred to the emergency department for further evaluation. Patient appears intoxicated with slurred speech and will be transferred over to [facility] ED and reevaluated by psychiatry in the morning. Reported called to Dr. Bebe Shaggy MD at [facility].   Jasper Riling, NP 06/21/2021, 2:13 AM

## 2021-06-21 NOTE — ED Provider Notes (Signed)
[facility]-EMERGENCY DEPT Provider Note   CSN: [id number] Arrival date & time: 06/21/21  5176     History Chief Complaint  Patient presents with   Alcohol Intoxication    Gary [name] is a 35 y.o. male with a history of HIV, alcohol use disorder polysubstance use disorder who presents to the emergency department from [facility] for medical clearance.  Patient presented to behavioral health urgent care with a chief complaint of I do not feel safe.  Patient was endorsing HI and hallucinations.  Clinician was concern for acute intoxication and the patient was transferred to the emergency department for medical clearance.  On my evaluation, the patient reports that he consumed 240 ounce beers earlier today.  Last drink was 17:00.  His only complaint at this time is that he feels that he has an alcohol withdrawal.  He denies other illicit or recreational substance use.  He is a current, everyday tobacco user.  He states that he was feeling homicidal towards individuals that he would not name earlier tonight, but reports that this has resolved.  He also denies auditory or visual hallucinations.  Denies SI.  He does report that he fell from standing 2 days ago and hit his head.  Denies loss of consciousness.  No headache, neck pain, visual changes, back pain, chest pain, shortness of breath, numbness, weakness, or facial pain or swelling.  He has no other medical complaints at this time.  The history is provided by the patient and medical records. No language interpreter was used.      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   Elevated troponin 10/19/2020   Alcohol withdrawal (HCC) 10/19/2020   Alcohol  abuse    Suicidal ideation 06/14/2020   Pancreatitis 06/13/2020   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   Malnutrition of moderate degree 05/07/2020   Gastrointestinal hemorrhage    Alcoholic hepatitis without ascites    Melena 05/05/2020   Alcoholic ketoacidosis 05/05/2020   Thrombocytopenia (HCC) 07/25/2019   Transaminitis 07/25/2019   Traumatic subdural hematoma (HCC) 07/02/2019   Alcohol abuse with intoxication (HCC) 07/02/2019   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    Past Surgical History:  Procedure Laterality Date   INCISION AND DRAINAGE PERIRECTAL ABSCESS N/A 09/24/2016   Procedure: IRRIGATION AND DEBRIDEMENT PERIRECTAL ABSCESS;  Surgeon: Ricarda Frame, MD;  Location: [facility] ORS;  Service: General;  Laterality: N/A;   none         Family History  Problem Relation Age of Onset   Alcohol abuse Mother    Alcohol abuse Father     Social History   Tobacco Use   Smoking status: Every Day    Packs/day: 0.50    Types: Cigarettes   Smokeless tobacco: Never   Tobacco comments:    unable to smoke while incarcerated 6+ months 02/13/20  Vaping Use   Vaping Use: Never used  Substance Use Topics   Alcohol use: Yes    Comment: "  I drink all I can"   Drug use: Not Currently    Types: Marijuana, Methamphetamines    Comment: last used 04/10/2019    Home Medications Prior to Admission medications   Medication Sig Start Date End Date Taking? Authorizing Provider  bictegravir-emtricitabine-tenofovir AF (BIKTARVY) 50-200-25 MG TABS tablet Take 1 tablet by mouth daily. 05/13/21   Kuppelweiser, Cassie L, RPH-CPP  famotidine (PEPCID) 20 MG tablet Take 1 tablet (20 mg total) by mouth 2 (two) times daily. Patient not  taking: Reported on 06/01/2019 05/14/19 06/02/19  Benjiman Core, MD    Allergies    Tegretol [carbamazepine] and Caffeine  Review of Systems   Review of Systems  Constitutional:  Negative for appetite change and fever.  HENT:  Negative for congestion and sore throat.   Respiratory:  Negative for shortness of breath.   Cardiovascular:  Negative for chest pain.  Gastrointestinal:  Negative for abdominal pain, constipation, diarrhea, nausea and vomiting.  Genitourinary:  Negative for dysuria.  Musculoskeletal:  Negative for back pain, myalgias, neck pain and neck stiffness.  Skin:  Positive for wound. Negative for rash.  Allergic/Immunologic: Negative for immunocompromised state.  Neurological:  Negative for seizures, syncope, weakness, numbness and headaches.  Psychiatric/Behavioral:  Negative for confusion.    Physical Exam Updated Vital Signs BP (!) 147/104 (BP Location: Left Arm)   Pulse 83   Temp 98.1 F (36.7 C) (Oral)   Resp 18   Ht 6\' 1"  (1.854 m)   Wt 72.6 kg   SpO2 97%   BMI 21.11 kg/m   Physical Exam Vitals and nursing note reviewed.  Constitutional:      General: He is not in acute distress.    Appearance: He is well-developed. He is not ill-appearing, toxic-appearing or diaphoretic.  HENT:     Head: Normocephalic.     Jaw: There is normal jaw occlusion.     Comments: Superficial abrasion noted to the right occiput.  No lacerations.  No surrounding ecchymosis.  No crepitus or step-offs noted to the scalp.  There are well-healing superficial abrasions noted to the right face.  No associated tenderness to palpation.  Eyes:     Conjunctiva/sclera: Conjunctivae normal.     Pupils: Pupils are equal, round, and reactive to light.  Cardiovascular:     Rate and Rhythm: Normal rate and regular rhythm.     Pulses: Normal pulses.     Heart sounds: Normal heart sounds. No murmur heard.   No friction rub. No gallop.  Pulmonary:     Effort: Pulmonary effort is normal. No  respiratory distress.     Breath sounds: No stridor. No wheezing, rhonchi or rales.  Chest:     Chest wall: No tenderness.  Abdominal:     General: There is no distension.     Palpations: Abdomen is soft. There is no mass.     Tenderness: There is no abdominal tenderness. There is no right CVA tenderness, left CVA tenderness, guarding or rebound.     Hernia: No hernia is present.  Musculoskeletal:     Cervical back: Neck supple.  Skin:    General: Skin is warm and dry.  Neurological:     Mental Status: He is alert.     Comments: Alert and oriented x3.  Speech is not slurred.  Gait is not ataxic.  Moves all 4 extremities spontaneously.  Good strength against resistance of the bilateral upper and lower extremities.  Sensation is intact and equal throughout.  Psychiatric:  Attention and Perception: Attention and perception normal. He does not perceive auditory or visual hallucinations.        Behavior: Behavior normal. Behavior is cooperative.        Thought Content: Thought content does not include homicidal or suicidal ideation. Thought content does not include homicidal or suicidal plan.    ED Results / Procedures / Treatments   Labs (all labs ordered are listed, but only abnormal results are displayed) Labs Reviewed - No data to display  EKG None  Radiology No results found.  Procedures Procedures   Medications Ordered in ED Medications - No data to display  ED Course  I have reviewed the triage vital signs and the nursing notes.  Pertinent labs & imaging results that were available during my care of the patient were reviewed by me and considered in my medical decision making (see chart for details).    MDM Rules/Calculators/A&P                           35 year old male with a history of HIV, alcohol use disorder polysubstance use disorder who presents the emergency department from [facility] for medical clearance.  Patient was noted to be intoxicated during his  initial evaluation and overnight observation was recommended.  Vital signs are stable.  The patient was discussed with Dr. Bebe Shaggy, attending physician.  On exam, patient no longer appears clinically intoxicated.  His speech is not slurred and gait is not ataxic.  He does have a superficial abrasion noted to his right head that he states is from a fall 2 days ago.  He denies associated loss of consciousness.  He has no headache, numbness, weakness, dizziness, or vomiting associated with the fall.  Since he has had his baseline neurologic status and incident occurred 48 hours ago, no indication for further emergent work-up.  None the patient is clinically sober, he adamantly denies SI, HI, or auditory visual hallucinations.  Patient is well-known to this emergency department with 32 visits in the last 6 months.  He is now at his baseline.  Suspect HI associated with alcohol intoxication.  Now that he is clinically sober, he can be discharged.  Although patient is concern for alcohol withdrawal, he has no evidence of withdrawal on exam, including tremors.  Appropriate for discharge at this time.  Heart rate is normal.  He is having no hallucinations, headache, or anxiety.  Recommended outpatient follow-up. ER return precautions given.  Final Clinical Impression(s) / ED Diagnoses Final diagnoses:  Alcoholic intoxication without complication [facility])    Rx / DC Orders ED Discharge Orders     None        Barkley Boards, PA-C 06/21/21 0725    Zadie Rhine, MD 06/21/21 2309

## 2021-06-21 NOTE — ED Triage Notes (Signed)
Patient brought from [facility]. Patient states that he is here because he want help with alcohol withdrawals. Patient is in the room sleeping right now.

## 2021-06-21 NOTE — BH Assessment (Signed)
Gary [name], Emergent, [id number]; 35 years old presents this date with [employer]. Pt denies SI.  Pt repots HI.  Pt reports hearing voices; responding to internal stimuli.  Provider reports Pt not medically cleared and recommend Pt transported to ED.  Pt unable to sign MSE, signed by clinician.

## 2021-06-21 NOTE — Discharge Instructions (Addendum)
Thank you for allowing me to care for you today in the Emergency Department.   Return to the emergency department for new or worsening symptoms.

## 2021-06-21 NOTE — Discharge Instructions (Addendum)
  Discharge recommendations:  Patient will transfer over to [facility] ED for continuous observation  Therapy: We recommend that patient participate in individual therapy to address mental health concerns.  Medications: The parent/guardian is to contact a medical professional and/or outpatient provider to address any new side effects that develop. Parent/guardian should update outpatient providers of any new medications and/or medication changes.   Safety:  The patient should abstain from use of illicit substances/drugs and abuse of any medications. If symptoms worsen or do not continue to improve or if the patient becomes actively suicidal or homicidal then it is recommended that the patient return to the closest hospital emergency department, the [facility], or call 911 for further evaluation and treatment. National Suicide Prevention Lifeline 1-800-SUICIDE or 804-259-3360.

## 2021-06-26 ENCOUNTER — Other Ambulatory Visit: Payer: Self-pay

## 2021-06-26 ENCOUNTER — Emergency Department (HOSPITAL_COMMUNITY)
Admission: EM | Admit: 2021-06-26 | Discharge: 2021-06-26 | Disposition: A | Discharge status: Home / Self Care | Payer: Self-pay | Attending: Emergency Medicine | Admitting: Emergency Medicine

## 2021-06-26 DIAGNOSIS — Z21 Asymptomatic human immunodeficiency virus [HIV] infection status: Secondary | ICD-10-CM | POA: Insufficient documentation

## 2021-06-26 DIAGNOSIS — S50311D Abrasion of right elbow, subsequent encounter: Secondary | ICD-10-CM | POA: Insufficient documentation

## 2021-06-26 DIAGNOSIS — Z79899 Other long term (current) drug therapy: Secondary | ICD-10-CM | POA: Insufficient documentation

## 2021-06-26 DIAGNOSIS — F17210 Nicotine dependence, cigarettes, uncomplicated: Secondary | ICD-10-CM | POA: Insufficient documentation

## 2021-06-26 DIAGNOSIS — X58XXXD Exposure to other specified factors, subsequent encounter: Secondary | ICD-10-CM | POA: Insufficient documentation

## 2021-06-26 DIAGNOSIS — Z5189 Encounter for other specified aftercare: Secondary | ICD-10-CM

## 2021-06-26 NOTE — ED Triage Notes (Signed)
Pt presents for eval of R arm injury from two days ago, for which he was already evaluated at a [place] facility. Wound is wrapped and he denies any new injury.

## 2021-06-26 NOTE — Discharge Instructions (Addendum)
Return for any problem.  ?

## 2021-06-26 NOTE — ED Provider Notes (Signed)
[facility] EMERGENCY DEPARTMENT Provider Note   CSN: [id number] Arrival date & time: 06/26/21  6256     History Chief Complaint  Patient presents with   Arm Injury    Gary [name] is a 35 y.o. male.  35 year old male with prior medical history as detailed below presents for evaluation. He is concerned about an abrasion on his right elbow.  This occurred several days ago.  He was apparently seen approximately 2 days prior at [facility].  His abrasion was dressed at that time.  He reports that he has not taken off the dressing it was applied at that time.  He denies other complaint.  He is alert and oriented.  He is appropriate.  The history is provided by the patient.  Wound Check This is a new problem. The current episode started 2 days ago. The problem occurs constantly. Pertinent negatives include no chest pain and no abdominal pain. Nothing aggravates the symptoms. Nothing relieves the symptoms.      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   Malnutrition of moderate degree 05/07/2020   Gastrointestinal hemorrhage    Alcoholic hepatitis without ascites    Melena 05/05/2020   Alcoholic ketoacidosis 05/05/2020   Thrombocytopenia (HCC) 07/25/2019   Transaminitis 07/25/2019   Traumatic subdural hematoma (HCC) 07/02/2019   Alcohol abuse with intoxication (HCC) 07/02/2019   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   Alcohol use disorder, severe, dependence (HCC) 04/16/2019   Major  depressive disorder, 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    Past Surgical History:  Procedure Laterality Date   INCISION AND DRAINAGE PERIRECTAL ABSCESS N/A 09/24/2016   Procedure: IRRIGATION AND DEBRIDEMENT PERIRECTAL ABSCESS;  Surgeon: Ricarda Frame, MD;  Location: [facility] ORS;  Service: General;  Laterality: N/A;   none         Family History  Problem Relation Age of Onset   Alcohol abuse Mother    Alcohol abuse Father     Social History   Tobacco Use   Smoking status: Every Day    Packs/day: 0.50    Types: Cigarettes   Smokeless tobacco: Never   Tobacco comments:    unable to smoke while incarcerated 6+ months 02/13/20  Vaping Use   Vaping Use: Never used  Substance Use Topics   Alcohol use: Yes    Comment: "I drink all I can"   Drug use: Not Currently    Types: Marijuana, Methamphetamines    Comment: last used 04/10/2019    Home Medications Prior to Admission medications   Medication Sig Start Date End Date Taking? Authorizing Provider  bictegravir-emtricitabine-tenofovir AF (BIKTARVY) 50-200-25 MG TABS tablet Take 1 tablet by mouth daily. 05/13/21   Kuppelweiser, Cassie L, RPH-CPP  famotidine (PEPCID) 20 MG tablet Take 1 tablet (20 mg total) by mouth 2 (two) times daily. Patient not taking: Reported on 06/01/2019 05/14/19 06/02/19  Rubin Payor,  Harrold Donath, MD    Allergies    Tegretol [carbamazepine] and Caffeine  Review of Systems   Review of Systems  Cardiovascular:  Negative for chest pain.  Gastrointestinal:  Negative for abdominal pain.  All other systems reviewed and are negative.  Physical Exam Updated Vital Signs BP 133/80 (BP Location: Left Arm)   Pulse 96   Temp 98.1 F (36.7 C) (Oral)   Resp 17   SpO2 100%   Physical Exam Vitals and nursing note reviewed.  Constitutional:      General: He is not in acute distress.     Appearance: Normal appearance. He is well-developed.  HENT:     Head: Normocephalic and atraumatic.  Eyes:     Conjunctiva/sclera: Conjunctivae normal.     Pupils: Pupils are equal, round, and reactive to light.  Cardiovascular:     Rate and Rhythm: Normal rate and regular rhythm.     Heart sounds: Normal heart sounds.  Pulmonary:     Effort: Pulmonary effort is normal. No respiratory distress.     Breath sounds: Normal breath sounds.  Abdominal:     General: There is no distension.     Palpations: Abdomen is soft.     Tenderness: There is no abdominal tenderness.  Musculoskeletal:        General: No deformity. Normal range of motion.     Cervical back: Normal range of motion and neck supple.  Skin:    General: Skin is warm and dry.     Comments: Facial abrasion noted to the lateral aspect of the right elbow.  Full active range of motion of the right arm is noted.  Dressing removed and wound is cleaned.  No evidence of infection found.  Patient's wound was redressed with a clean Kerlix wrap.  Patient tolerated this well.  Neurological:     General: No focal deficit present.     Mental Status: He is alert and oriented to person, place, and time.    ED Results / Procedures / Treatments   Labs (all labs ordered are listed, but only abnormal results are displayed) Labs Reviewed - No data to display  EKG None  Radiology No results found.  Procedures Procedures   Medications Ordered in ED Medications - No data to display  ED Course  I have reviewed the triage vital signs and the nursing notes.  Pertinent labs & imaging results that were available during my care of the patient were reviewed by me and considered in my medical decision making (see chart for details).    MDM Rules/Calculators/A&P                           Mdm  MSE complete  [name] was evaluated in Emergency Department on 06/26/2021 for the symptoms described in the history of present illness.  He was evaluated in the context of the global COVID-19 pandemic, which necessitated consideration that the patient might be at risk for infection with the SARS-CoV-2 virus that causes COVID-19. Institutional protocols and algorithms that pertain to the evaluation of patients at risk for COVID-19 are in a state of rapid change based on information released by regulatory bodies including the CDC and federal and state organizations. These policies and algorithms were followed during the patient's care in the ED.  Patient is presenting for evaluation and wound check of his abrasion noted to the right elbow.  He is otherwise without complaint.  Abrasion of  the right elbow is without evidence of cellulitis or infection.  Wound was redressed after inspection.  Patient understands need for close follow-up.  Strict return precautions given and understood. Final Clinical Impression(s) / ED Diagnoses Final diagnoses:  Visit for wound check    Rx / DC Orders ED Discharge Orders     None        Wynetta Fines, MD 06/26/21 1236

## 2021-06-28 ENCOUNTER — Other Ambulatory Visit: Payer: Self-pay

## 2021-06-28 ENCOUNTER — Emergency Department (HOSPITAL_COMMUNITY)
Admission: EM | Admit: 2021-06-28 | Discharge: 2021-06-29 | Disposition: A | Discharge status: Home / Self Care | Payer: Self-pay | Attending: Emergency Medicine | Admitting: Emergency Medicine

## 2021-06-28 DIAGNOSIS — F1010 Alcohol abuse, uncomplicated: Secondary | ICD-10-CM | POA: Insufficient documentation

## 2021-06-28 DIAGNOSIS — Z20822 Contact with and (suspected) exposure to covid-19: Secondary | ICD-10-CM | POA: Insufficient documentation

## 2021-06-28 DIAGNOSIS — F17210 Nicotine dependence, cigarettes, uncomplicated: Secondary | ICD-10-CM | POA: Insufficient documentation

## 2021-06-28 DIAGNOSIS — Z21 Asymptomatic human immunodeficiency virus [HIV] infection status: Secondary | ICD-10-CM | POA: Insufficient documentation

## 2021-06-28 DIAGNOSIS — R5383 Other fatigue: Secondary | ICD-10-CM | POA: Insufficient documentation

## 2021-06-28 MED ORDER — LORAZEPAM 1 MG PO TABS
0.0000 mg | ORAL_TABLET | Freq: Two times a day (BID) | ORAL | Status: DC
Start: 2021-07-01 — End: 2021-06-29

## 2021-06-28 MED ORDER — LORAZEPAM 2 MG/ML IJ SOLN: 0.0000 mg | Freq: Two times a day (BID) | INTRAMUSCULAR | Status: DC

## 2021-06-28 MED ORDER — LORAZEPAM 1 MG PO TABS: 0.0000 mg | ORAL_TABLET | Freq: Four times a day (QID) | ORAL | Status: DC

## 2021-06-28 MED ORDER — LORAZEPAM 2 MG/ML IJ SOLN
0.0000 mg | Freq: Four times a day (QID) | INTRAMUSCULAR | Status: DC
  Administered 2021-06-28: 2 mg via INTRAVENOUS
  Filled 2021-06-28: qty 1

## 2021-06-28 MED ORDER — SODIUM CHLORIDE 0.9 % IV BOLUS
500.0000 mL | Freq: Once | INTRAVENOUS | Status: AC
  Administered 2021-06-28: 500 mL via INTRAVENOUS

## 2021-06-28 MED ORDER — THIAMINE HCL 100 MG/ML IJ SOLN: 100.0000 mg | Freq: Every day | INTRAMUSCULAR | Status: DC

## 2021-06-28 MED ORDER — THIAMINE HCL 100 MG PO TABS: 100.0000 mg | ORAL_TABLET | Freq: Every day | ORAL | Status: DC

## 2021-06-28 NOTE — ED Triage Notes (Signed)
Pt arrives to ER by EMS shaking and weakness from alcohol withdrawal.  Pt was picked up from a gas station near [place].  Pt reports being sober for 40 days and then a week ago he started drinking and drink for an entire week straight and stopped this morning.  Pt a&ox4, reports blurry vision and feels like he is going to pass out.   EMS reports vitals: 132/90, pulse 110, spo2 98, rr 18, cbg 96.  Temp 98.2.

## 2021-06-28 NOTE — ED Provider Notes (Signed)
Emergency Department Provider Note   I have reviewed the triage vital signs and the nursing notes.   HISTORY  Chief Complaint Alcohol Problem   HPI Gary [name] is a 35 y.o. male with past medical history reviewed below including alcohol abuse presents to the emergency department with tremors and weakness.  Patient states that he has a Gary [name] history of drinking and has had tremors in the past.  He has been sober for the past 40 days but in the last 7 days has started drinking again.  He last drank 18 hours prior to ED presentation.  He states that timeframe he has developed some fatigue, lightheadedness, shaking.  No seizure activity.  No falls or head injury.  He ultimately was transported to the hospital by EMS. He is seeking evaluation for EtOH withdrawal symptoms. No fever or chills.   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   Malnutrition of moderate degree 05/07/2020   Gastrointestinal hemorrhage    Alcoholic hepatitis without ascites    Melena 05/05/2020   Alcoholic ketoacidosis 05/05/2020   Thrombocytopenia (HCC) 07/25/2019   Transaminitis 07/25/2019   Traumatic subdural hematoma (HCC) 07/02/2019   Alcohol abuse with intoxication (HCC) 07/02/2019   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   Alcohol use disorder, severe, dependence (HCC) 04/16/2019   Major depressive disorder, 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   HIV disease (HCC) 06/16/2018   Polysubstance abuse (HCC) 06/16/2018    Cigarette smoker 06/16/2018    Past Surgical History:  Procedure Laterality Date   INCISION AND DRAINAGE PERIRECTAL ABSCESS N/A 09/24/2016   Procedure: IRRIGATION AND DEBRIDEMENT PERIRECTAL ABSCESS;  Surgeon: Ricarda Frame, MD;  Location: [facility] ORS;  Service: General;  Laterality: N/A;   none      Allergies Tegretol [carbamazepine] and Caffeine  Family History  Problem Relation Age of Onset   Alcohol abuse Mother    Alcohol abuse Father     Social History Social History   Tobacco Use   Smoking status: Every Day    Packs/day: 0.50    Types: Cigarettes   Smokeless tobacco: Never   Tobacco comments:    unable to smoke while incarcerated 6+ months 02/13/20  Vaping Use   Vaping Use: Never used  Substance Use Topics   Alcohol use: Yes    Comment: "I drink all I can"   Drug use: Not Currently    Types: Marijuana, Methamphetamines    Comment: last used 04/10/2019    Review of Systems  Constitutional: No fever/chills. Positive weakness and lightheadedness.  Eyes: Positive blurry vision.  ENT: No sore throat. Cardiovascular: Denies chest pain. Respiratory: Denies shortness of breath. Gastrointestinal: No abdominal pain. Mild nausea, no vomiting.  No diarrhea.  No constipation. Genitourinary: Negative for dysuria. Musculoskeletal: Negative for back pain. Skin: Negative for rash. Neurological: Negative for headaches, focal weakness or numbness. Positive tremors.   10-point ROS otherwise negative.  ____________________________________________   PHYSICAL EXAM:  VITAL SIGNS: ED Triage Vitals  Enc Vitals Group  BP 06/28/21 2014 (!) 118/94     Pulse Rate 06/28/21 2014 95     Resp 06/28/21 2113 20     Temp 06/28/21 2014 99.2 F (37.3 C)     Temp Source 06/28/21 2014 Oral     SpO2 06/28/21 2014 99 %     Weight 06/28/21 2017 160 lb 0.9 oz (72.6 kg)     Height 06/28/21 2017 6\' 1"  (1.854 m)   Constitutional: Alert and oriented. Well appearing and in no acute  distress. Eyes: Conjunctivae are normal.  Head: Atraumatic. Nose: No congestion/rhinnorhea. Mouth/Throat: Mucous membranes are moist.   Neck: No stridor.   Cardiovascular: Normal rate, regular rhythm. Good peripheral circulation. Grossly normal heart sounds.   Respiratory: Normal respiratory effort.  No retractions. Lungs CTAB. Gastrointestinal: Soft and nontender. No distention.  Musculoskeletal: No lower extremity tenderness nor edema. No gross deformities of extremities. Neurologic:  Normal speech and language. No gross focal neurologic deficits are appreciated. Slight tremor noted.  Skin:  Skin is warm, dry and intact. No rash noted. Psychiatric: Mood and affect are normal. Speech and behavior are normal.  ____________________________________________   LABS (all labs ordered are listed, but only abnormal results are displayed)  Labs Reviewed  COMPREHENSIVE METABOLIC PANEL - Abnormal; Notable for the following components:      Result Value   Sodium 150 (*)    Chloride 112 (*)    AST 45 (*)    All other components within normal limits  CBC WITH DIFFERENTIAL/PLATELET - Abnormal; Notable for the following components:   WBC 2.9 (*)    RBC 4.21 (*)    Neutro Abs 1.4 (*)    All other components within normal limits  RESP PANEL BY RT-PCR (FLU A&B, COVID) ARPGX2  LIPASE, BLOOD   ____________________________________________  RADIOLOGY  No results found.  ____________________________________________   PROCEDURES  Procedure(s) performed:   Procedures   ____________________________________________   INITIAL IMPRESSION / ASSESSMENT AND PLAN / ED COURSE  Pertinent labs & imaging results that were available during my care of the patient were reviewed by me and considered in my medical decision making (see chart for details).   Patient presents emergency department with request for alcohol withdrawal treatment.  Has relapsed on alcohol over the past week after being sober  for 40 days.  Initial CIWA documented at 15 which on my assessment seems high.  Patient does have tremor but vital signs are largely within normal limits.  No altered mental status.  No vomiting.  01:00 AM  On reassessment the patient's labs are reassuring. CIWA scoring at 2 which seems more in line with my assessment. Plan for EtOH resources, PEER consultation, and will offer librium. Mild hypernatremia on labs. IVF given here.    ____________________________________________  FINAL CLINICAL IMPRESSION(S) / ED DIAGNOSES  Final diagnoses:  Alcohol abuse     MEDICATIONS GIVEN DURING THIS VISIT:  Medications  LORazepam (ATIVAN) injection 0-4 mg (2 mg Intravenous Given 06/28/21 2325)    Or  LORazepam (ATIVAN) tablet 0-4 mg ( Oral See Alternative 06/28/21 2325)  LORazepam (ATIVAN) injection 0-4 mg (has no administration in time range)    Or  LORazepam (ATIVAN) tablet 0-4 mg (has no administration in time range)  thiamine tablet 100 mg (has no administration in time range)    Or  thiamine (B-1) injection 100 mg (has no administration in time range)  sodium chloride 0.9 % bolus 500 mL (0 mLs Intravenous Stopped 06/29/21 0007)     NEW  OUTPATIENT MEDICATIONS STARTED DURING THIS VISIT:  New Prescriptions   CHLORDIAZEPOXIDE (LIBRIUM) 25 MG CAPSULE    50mg  PO TID x 1D, then 25-50mg  PO BID X 1D, then 25-50mg  PO QD X 1D   ONDANSETRON (ZOFRAN ODT) 4 MG DISINTEGRATING TABLET    Take 1 tablet (4 mg total) by mouth every 8 (eight) hours as needed for nausea or vomiting.    Note:  This document was prepared using Dragon voice recognition software and may include unintentional dictation errors.  , MD, [facility]    Celese Banner, [facility], MD 06/29/21 [phone]

## 2021-06-29 LAB — CBC WITH DIFFERENTIAL/PLATELET
Abs Immature Granulocytes: 0 10*3/uL (ref 0.00–0.07)
Basophils Absolute: 0 10*3/uL (ref 0.0–0.1)
Basophils Relative: 0 %
Eosinophils Absolute: 0.1 10*3/uL (ref 0.0–0.5)
Eosinophils Relative: 4 %
HCT: 40.5 % (ref 39.0–52.0)
Hemoglobin: 13.7 g/dL (ref 13.0–17.0)
Immature Granulocytes: 0 %
Lymphocytes Relative: 41 %
Lymphs Abs: 1.2 10*3/uL (ref 0.7–4.0)
MCH: 32.5 pg (ref 26.0–34.0)
MCHC: 33.8 g/dL (ref 30.0–36.0)
MCV: 96.2 fL (ref 80.0–100.0)
Monocytes Absolute: 0.2 10*3/uL (ref 0.1–1.0)
Monocytes Relative: 5 %
Neutro Abs: 1.4 10*3/uL — ABNORMAL LOW (ref 1.7–7.7)
Neutrophils Relative %: 50 %
Platelets: 164 10*3/uL (ref 150–400)
RBC: 4.21 MIL/uL — ABNORMAL LOW (ref 4.22–5.81)
RDW: 12.9 % (ref 11.5–15.5)
WBC: 2.9 10*3/uL — ABNORMAL LOW (ref 4.0–10.5)
nRBC: 0 % (ref 0.0–0.2)

## 2021-06-29 LAB — LIPASE, BLOOD: Lipase: 45 U/L (ref 11–51)

## 2021-06-29 LAB — COMPREHENSIVE METABOLIC PANEL
ALT: 31 U/L (ref 0–44)
AST: 45 U/L — ABNORMAL HIGH (ref 15–41)
Albumin: 4.3 g/dL (ref 3.5–5.0)
Alkaline Phosphatase: 67 U/L (ref 38–126)
Anion gap: 10 (ref 5–15)
BUN: 8 mg/dL (ref 6–20)
CO2: 28 mmol/L (ref 22–32)
Calcium: 9 mg/dL (ref 8.9–10.3)
Chloride: 112 mmol/L — ABNORMAL HIGH (ref 98–111)
Creatinine, Ser: 0.63 mg/dL (ref 0.61–1.24)
GFR, Estimated: >60 mL/min (ref >60)
Glucose, Bld: 88 mg/dL (ref 70–99)
Potassium: 4 mmol/L (ref 3.5–5.1)
Sodium: 150 mmol/L — ABNORMAL HIGH (ref 135–145)
Total Bilirubin: 0.8 mg/dL (ref 0.3–1.2)
Total Protein: 7.6 g/dL (ref 6.5–8.1)

## 2021-06-29 LAB — RESP PANEL BY RT-PCR (FLU A&B, COVID) ARPGX2
Influenza A by PCR: NEGATIVE
Influenza B by PCR: NEGATIVE
SARS Coronavirus 2 by RT PCR: NEGATIVE

## 2021-06-29 MED ORDER — CHLORDIAZEPOXIDE HCL 25 MG PO CAPS: ORAL_CAPSULE | ORAL | 0 refills | Status: DC

## 2021-06-29 MED ORDER — ONDANSETRON 4 MG PO TBDP: 4.0000 mg | ORAL_TABLET | Freq: Three times a day (TID) | ORAL | 0 refills | Status: DC | PRN

## 2021-06-29 NOTE — Discharge Instructions (Addendum)
Substance Abuse Treatment Programs ° °Intensive Outpatient Programs °[facility]     °[address]      °[place], [place]                   °[phone]      ° °[facility] °[address] °[place], [place] °[phone] ° °[facility]     °(Inpatient and outpatient)     °[address]           °[phone]   ° °[facility] °[phone] (Suboxone and Methadone) ° °[address]      °[place], [place] [address]      °[phone]      ° °[address] °[place], [place] °852-3033 ° °[facility] (Outpatient/Inpatient, Chemical)    °(insurance only) [phone]      °       °[facility] (Groups & Residential) °[place], [place] °[phone] ° °   °[facility]     °[address]     °[place], [place]      °[phone]      ° °[facility] (for caregivers and family) °[address] °[place], [place] °336-299-4655 ° ° ° ° ° °Residential Treatment Programs °[facility]      °[address], [place], [place] [address]  °[phone]      ° °[facility] °[address] [place], [place] [address] °[phone] ° °[facility]        °336-248-8914      ° °[facility] °[phone] ° °[facility]             °[address]                                         °[place], [place]                                                °[phone] or 336-784-9470                              ° °[facility] [place] °[address] °[place], [address] °[phone] ° °[facility]    °[address]      °[place], [place]     °[phone]      ° °[facility] °[address] °[place], [place] °[phone] ° °[facility]   °[address]     °[place], [place] [address]     °[phone]      °Admissions: 8am-3pm M-F ° °[facility] °[address] °[place],  [place] °336-227-7417 ° °[facility]: Residential Program (90 Days)   °[place], [place]      °[phone] or [phone]    ° °[facility] °[place],  °(Walk in Hours over the weekend or by referral) ° °[facility] °[address], [place],  [address] °[phone] ° °Crisis Mobile: [facility]:  1-877-626-1772 (for crisis response 24 hours a day) °[facility] Hotline:      1-800-256-2452 °Outpatient Psychiatry and Counseling ° °[facility]: Mobile Crisis   Management 24 hours:  1-877-626-1772 ° °[facility] sliding scale fee and walk in schedule: M-F 8am-12pm/1pm-3pm °[address]  °[place], [place] [address] °[phone] ° °[facility] °[address] °[place], [place] [address] °336-703-9650 ° °[facility] (Former[facility] known [facility])- new patient walk-in appointments available Monday - Friday 8am -3pm.          °[address] °[place], [place] [address] °336-676-6840 or crisis line- 336-676-6905 ° °[facility] Intensive Outpatient Therapy Program °[address] °[place], [place] [address] °336-832-9804 ° °[facility]                  °Crisis Services      °[phone]      °[address]     °[place], [place] [address]                ° °[facility]   °[facility] °[phone] °[address] °[place], [place] [address] ° ° °[facility]          °[address]  °[place], [place] [address]       °[phone] ° °[facility] °[address] °[place], [place] [address] °336-292-1510 ° °[facility]    °[address]    °[place], [place] [address]     °[phone]      ° °Parish McKinney, MD     °[address]     °[place] [address]     °336-282-1251     °  °[facility] °[address] °[place] [address] ° °[facility]     °[address]     °[place], [place]      °336-542-2076      ° °[facility] °Shamsher Ahluwalia, MD °[address] °[place], [place] [address] °[phone] ° °[facility]     °[address]     °[place], [place] [address]     °336-274-1237      ° °[facility] °[address] °[place], [place] [address] °336-854-4450 °Resources for Temporary Residential Assistance/Crisis Centers ° °DAY CENTERS °[facility] °M-F 8am-3pm   °[address] [place], [place] [address]   336-332-0824 °Services include: laundry, barbering, support groups, case management, phone  & computer access, showers, AA/NA mtgs, mental health/substance abuse nurse, job skills class, disability information, VA assistance, spiritual classes, etc.  ° °HOMELESS SHELTERS ° °[facility]     °[facility]   °[address], [place]     °[phone]       °       °[facility] (women and children)       °[address] °[place], [place] [address] °336-275-0820 °[email or web address] for application and process °Application Required ° °[facility]   °[address]    °[place] [address]     °[phone]       °             °[facility] °[address] °[place], [place] [address] °[phone] °336-235-0363(schedule application appt.) °Application Required ° °[facility] (women only)    °[address]     °[place],  [address]     °336-884-1039      °  Intake starts 6pm daily °Need valid ID, SSC, & Police report °[facility] [place] °[address] °[place], [place] °336-881-5420 °Application Required ° °[facility] (men only)     °[address]      °[place], [place]     °[phone]      ° °[facility] °(Pregnant women only) °[address] °[place], [place] °336-275-0206 ° °[facility]      °[address]      °[place], [place] [address]     °[phone]      °       °[facility] °[address] °[place], [place] °336-723-1848 °90 day commitment/SA/Application process ° °[facility](men only)     °[address]     °[place], [place]     °[phone]       °Check-in at 7pm     °       °[facility] °[address] °[place], [place] [address] °336-248-6684 °Men/Women/Women and Children must be there by 7 pm ° °[facility] °[place],  °336-722-8721                ° °

## 2021-06-30 ENCOUNTER — Other Ambulatory Visit: Payer: Self-pay

## 2021-06-30 ENCOUNTER — Emergency Department (HOSPITAL_COMMUNITY)
Admission: EM | Admit: 2021-06-30 | Discharge: 2021-07-01 | Disposition: A | Discharge status: Home / Self Care | Payer: Self-pay | Attending: Emergency Medicine | Admitting: Emergency Medicine

## 2021-06-30 ENCOUNTER — Encounter (HOSPITAL_COMMUNITY): Payer: Self-pay

## 2021-06-30 ENCOUNTER — Emergency Department (HOSPITAL_COMMUNITY): Payer: Self-pay

## 2021-06-30 DIAGNOSIS — Z20822 Contact with and (suspected) exposure to covid-19: Secondary | ICD-10-CM | POA: Insufficient documentation

## 2021-06-30 DIAGNOSIS — Z21 Asymptomatic human immunodeficiency virus [HIV] infection status: Secondary | ICD-10-CM | POA: Insufficient documentation

## 2021-06-30 DIAGNOSIS — Z79899 Other long term (current) drug therapy: Secondary | ICD-10-CM | POA: Insufficient documentation

## 2021-06-30 DIAGNOSIS — F10220 Alcohol dependence with intoxication, uncomplicated: Secondary | ICD-10-CM

## 2021-06-30 DIAGNOSIS — R079 Chest pain, unspecified: Secondary | ICD-10-CM | POA: Insufficient documentation

## 2021-06-30 DIAGNOSIS — F10920 Alcohol use, unspecified with intoxication, uncomplicated: Secondary | ICD-10-CM

## 2021-06-30 DIAGNOSIS — F10130 Alcohol abuse with withdrawal, uncomplicated: Secondary | ICD-10-CM | POA: Insufficient documentation

## 2021-06-30 DIAGNOSIS — F17210 Nicotine dependence, cigarettes, uncomplicated: Secondary | ICD-10-CM | POA: Insufficient documentation

## 2021-06-30 DIAGNOSIS — E876 Hypokalemia: Secondary | ICD-10-CM | POA: Insufficient documentation

## 2021-06-30 DIAGNOSIS — L03114 Cellulitis of left upper limb: Secondary | ICD-10-CM | POA: Insufficient documentation

## 2021-06-30 DIAGNOSIS — L0390 Cellulitis, unspecified: Secondary | ICD-10-CM

## 2021-06-30 DIAGNOSIS — Y908 Blood alcohol level of 240 mg/100 ml or more: Secondary | ICD-10-CM | POA: Insufficient documentation

## 2021-06-30 DIAGNOSIS — R1110 Vomiting, unspecified: Secondary | ICD-10-CM | POA: Insufficient documentation

## 2021-06-30 LAB — RESP PANEL BY RT-PCR (FLU A&B, COVID) ARPGX2
Influenza A by PCR: NEGATIVE
Influenza B by PCR: NEGATIVE
SARS Coronavirus 2 by RT PCR: NEGATIVE

## 2021-06-30 LAB — CBC WITH DIFFERENTIAL/PLATELET
Abs Immature Granulocytes: 0.01 10*3/uL (ref 0.00–0.07)
Basophils Absolute: 0 10*3/uL (ref 0.0–0.1)
Basophils Relative: 1 %
Eosinophils Absolute: 0.1 10*3/uL (ref 0.0–0.5)
Eosinophils Relative: 3 %
HCT: 40.1 % (ref 39.0–52.0)
Hemoglobin: 13.4 g/dL (ref 13.0–17.0)
Immature Granulocytes: 0 %
Lymphocytes Relative: 44 %
Lymphs Abs: 1.9 10*3/uL (ref 0.7–4.0)
MCH: 32.3 pg (ref 26.0–34.0)
MCHC: 33.4 g/dL (ref 30.0–36.0)
MCV: 96.6 fL (ref 80.0–100.0)
Monocytes Absolute: 0.2 10*3/uL (ref 0.1–1.0)
Monocytes Relative: 5 %
Neutro Abs: 2 10*3/uL (ref 1.7–7.7)
Neutrophils Relative %: 47 %
Platelets: 132 10*3/uL — ABNORMAL LOW (ref 150–400)
RBC: 4.15 MIL/uL — ABNORMAL LOW (ref 4.22–5.81)
RDW: 12.9 % (ref 11.5–15.5)
WBC: 4.4 10*3/uL (ref 4.0–10.5)
nRBC: 0 % (ref 0.0–0.2)

## 2021-06-30 LAB — COMPREHENSIVE METABOLIC PANEL
ALT: 35 U/L (ref 0–44)
AST: 49 U/L — ABNORMAL HIGH (ref 15–41)
Albumin: 4.1 g/dL (ref 3.5–5.0)
Alkaline Phosphatase: 74 U/L (ref 38–126)
Anion gap: 12 (ref 5–15)
BUN: 5 mg/dL — ABNORMAL LOW (ref 6–20)
CO2: 22 mmol/L (ref 22–32)
Calcium: 9 mg/dL (ref 8.9–10.3)
Chloride: 104 mmol/L (ref 98–111)
Creatinine, Ser: 0.75 mg/dL (ref 0.61–1.24)
GFR, Estimated: >60 mL/min (ref >60)
Glucose, Bld: 109 mg/dL — ABNORMAL HIGH (ref 70–99)
Potassium: 3.3 mmol/L — ABNORMAL LOW (ref 3.5–5.1)
Sodium: 138 mmol/L (ref 135–145)
Total Bilirubin: 0.5 mg/dL (ref 0.3–1.2)
Total Protein: 7.5 g/dL (ref 6.5–8.1)

## 2021-06-30 LAB — ACETAMINOPHEN LEVEL: Acetaminophen (Tylenol), Serum: <10 ug/mL — ABNORMAL LOW (ref 10–30)

## 2021-06-30 LAB — CK: Total CK: 820 U/L — ABNORMAL HIGH (ref 49–397)

## 2021-06-30 LAB — SALICYLATE LEVEL: Salicylate Lvl: <7 mg/dL — ABNORMAL LOW (ref 7.0–30.0)

## 2021-06-30 LAB — ETHANOL: Alcohol, Ethyl (B): 381 mg/dL (ref <10)

## 2021-06-30 IMAGING — CR DG FOREARM 2V*L*
3 series · 3 of 3 positions shown · non-contrast
Comparison: None.

CLINICAL DATA: Swelling.

EXAM:
LEFT HAND - COMPLETE 3+ VIEW; LEFT FOREARM - 2 VIEW

[forearm ap]
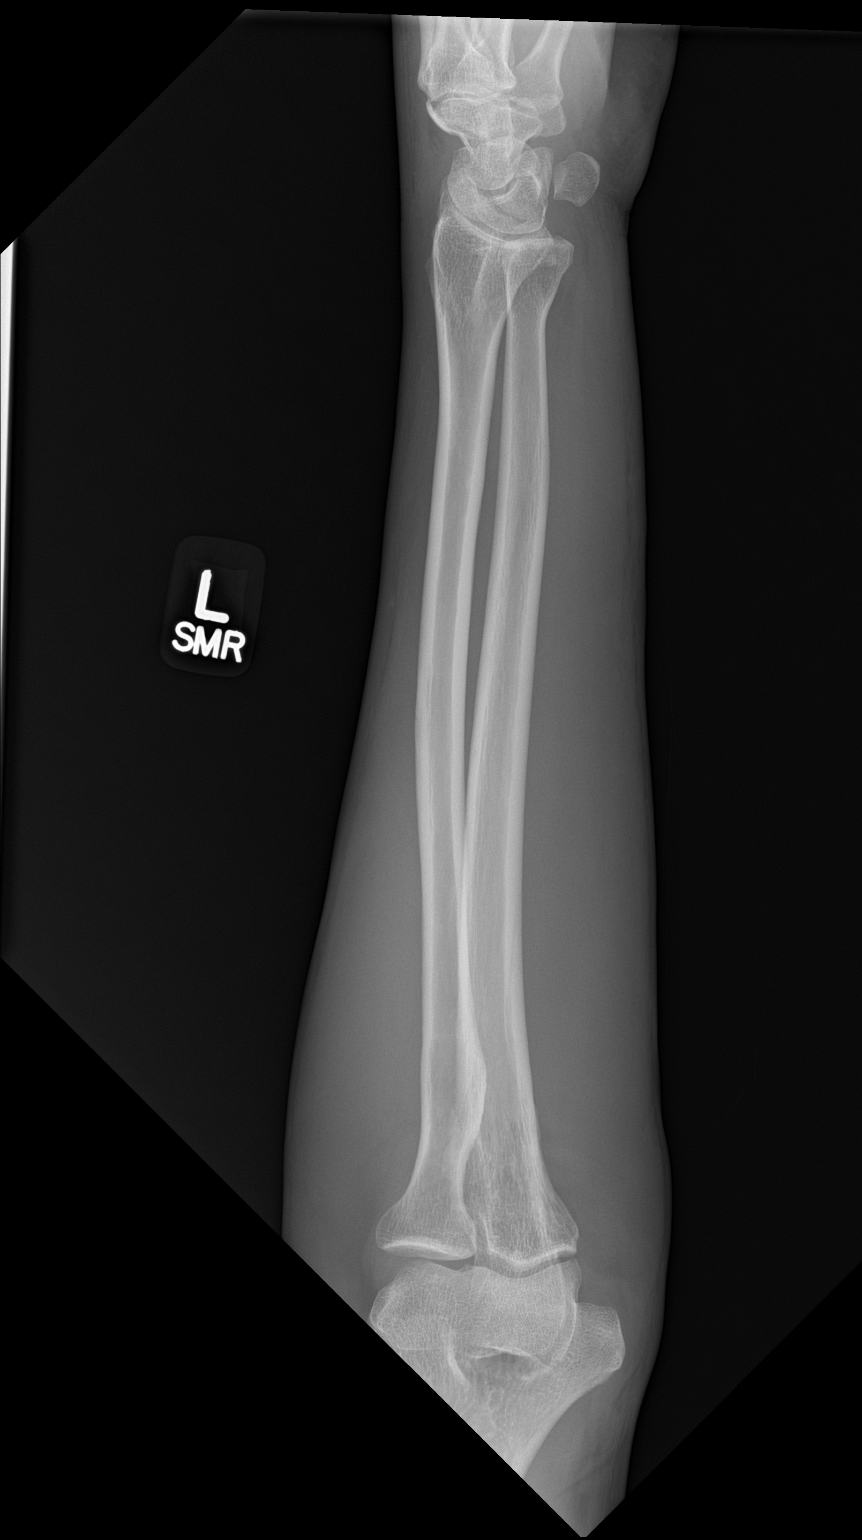

[forearm lat (1 of 2)]
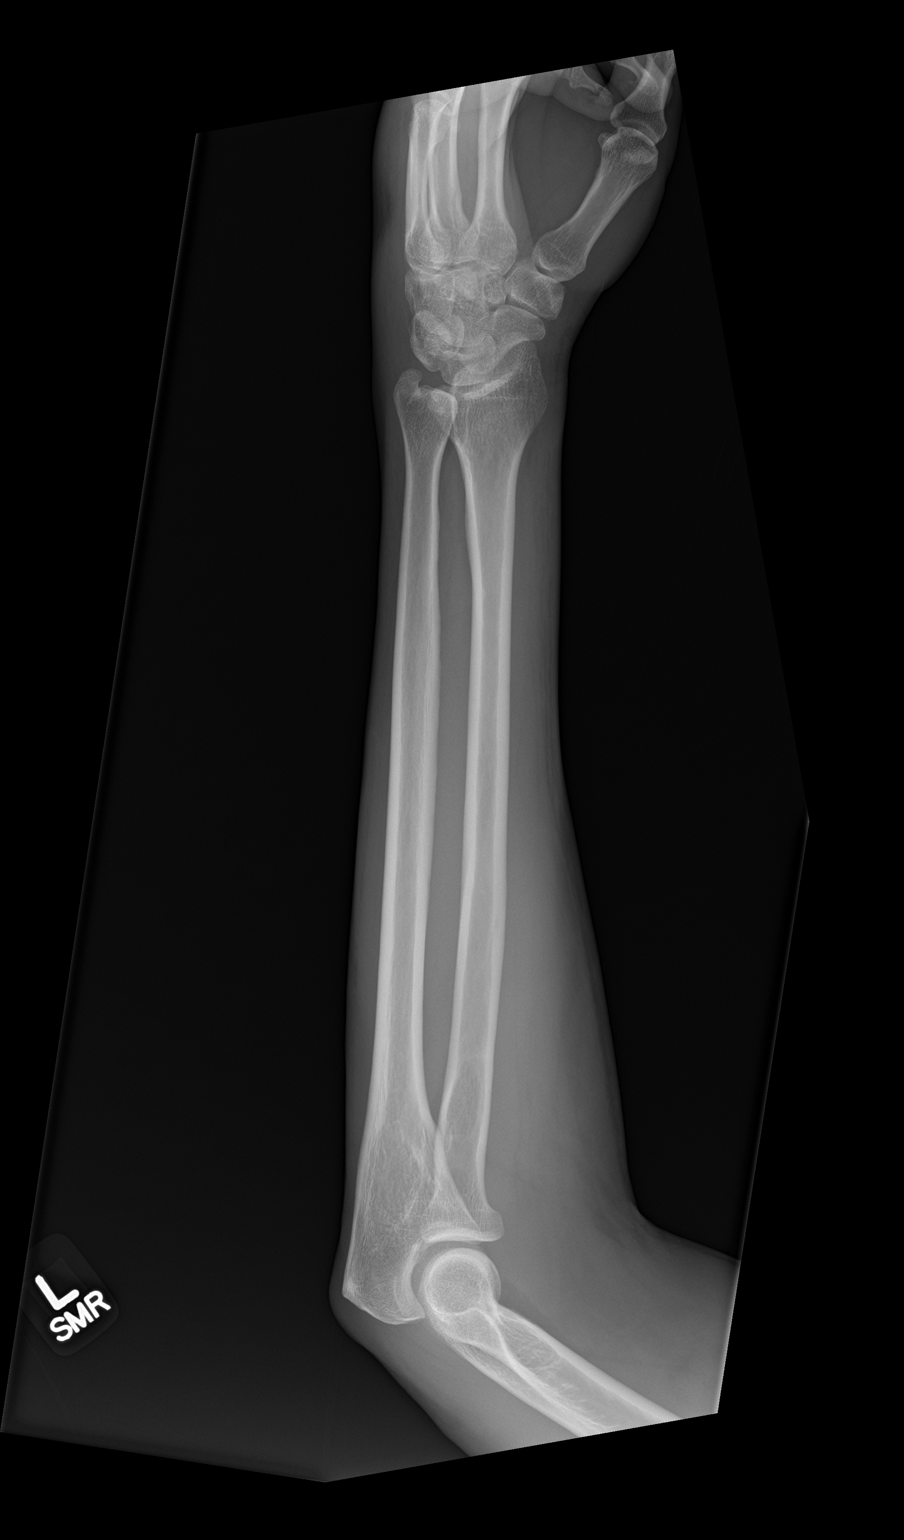

[forearm lat (2 of 2)]
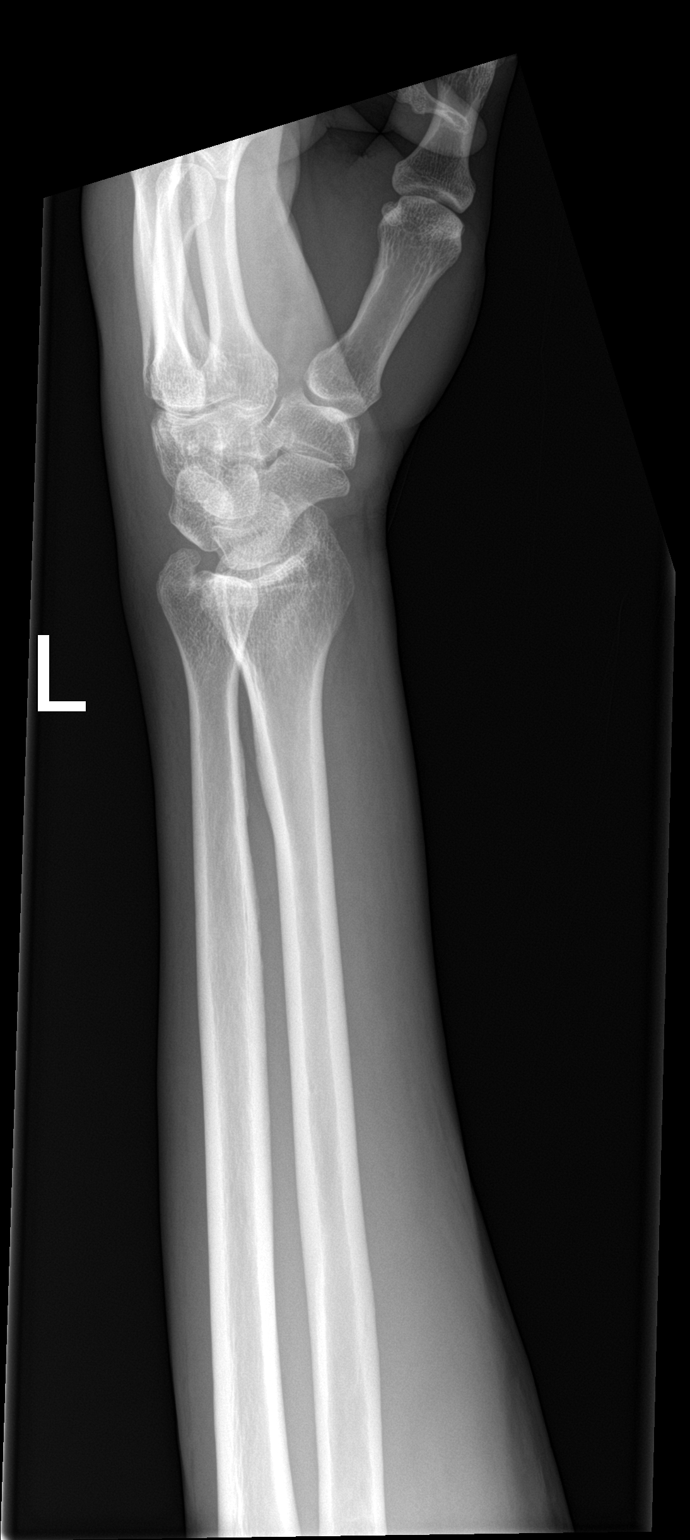

[3 of 3 positions shown; findings below may reference images not displayed]

FINDINGS: There is no evidence of fracture or dislocation. There is no
evidence of arthropathy or other focal bone abnormality. Soft tissue
swelling is present over the dorsum of the hand and wrist without
underlying fracture or foreign body.
IMPRESSION: Soft tissue swelling over the dorsum of the hand and wrist without
underlying fracture or foreign body.

## 2021-06-30 IMAGING — CR DG HAND COMPLETE 3+V*L*
3 series · 3 of 3 positions shown · non-contrast
Comparison: None.

CLINICAL DATA: Swelling.

EXAM:
LEFT HAND - COMPLETE 3+ VIEW; LEFT FOREARM - 2 VIEW

[hand pa]
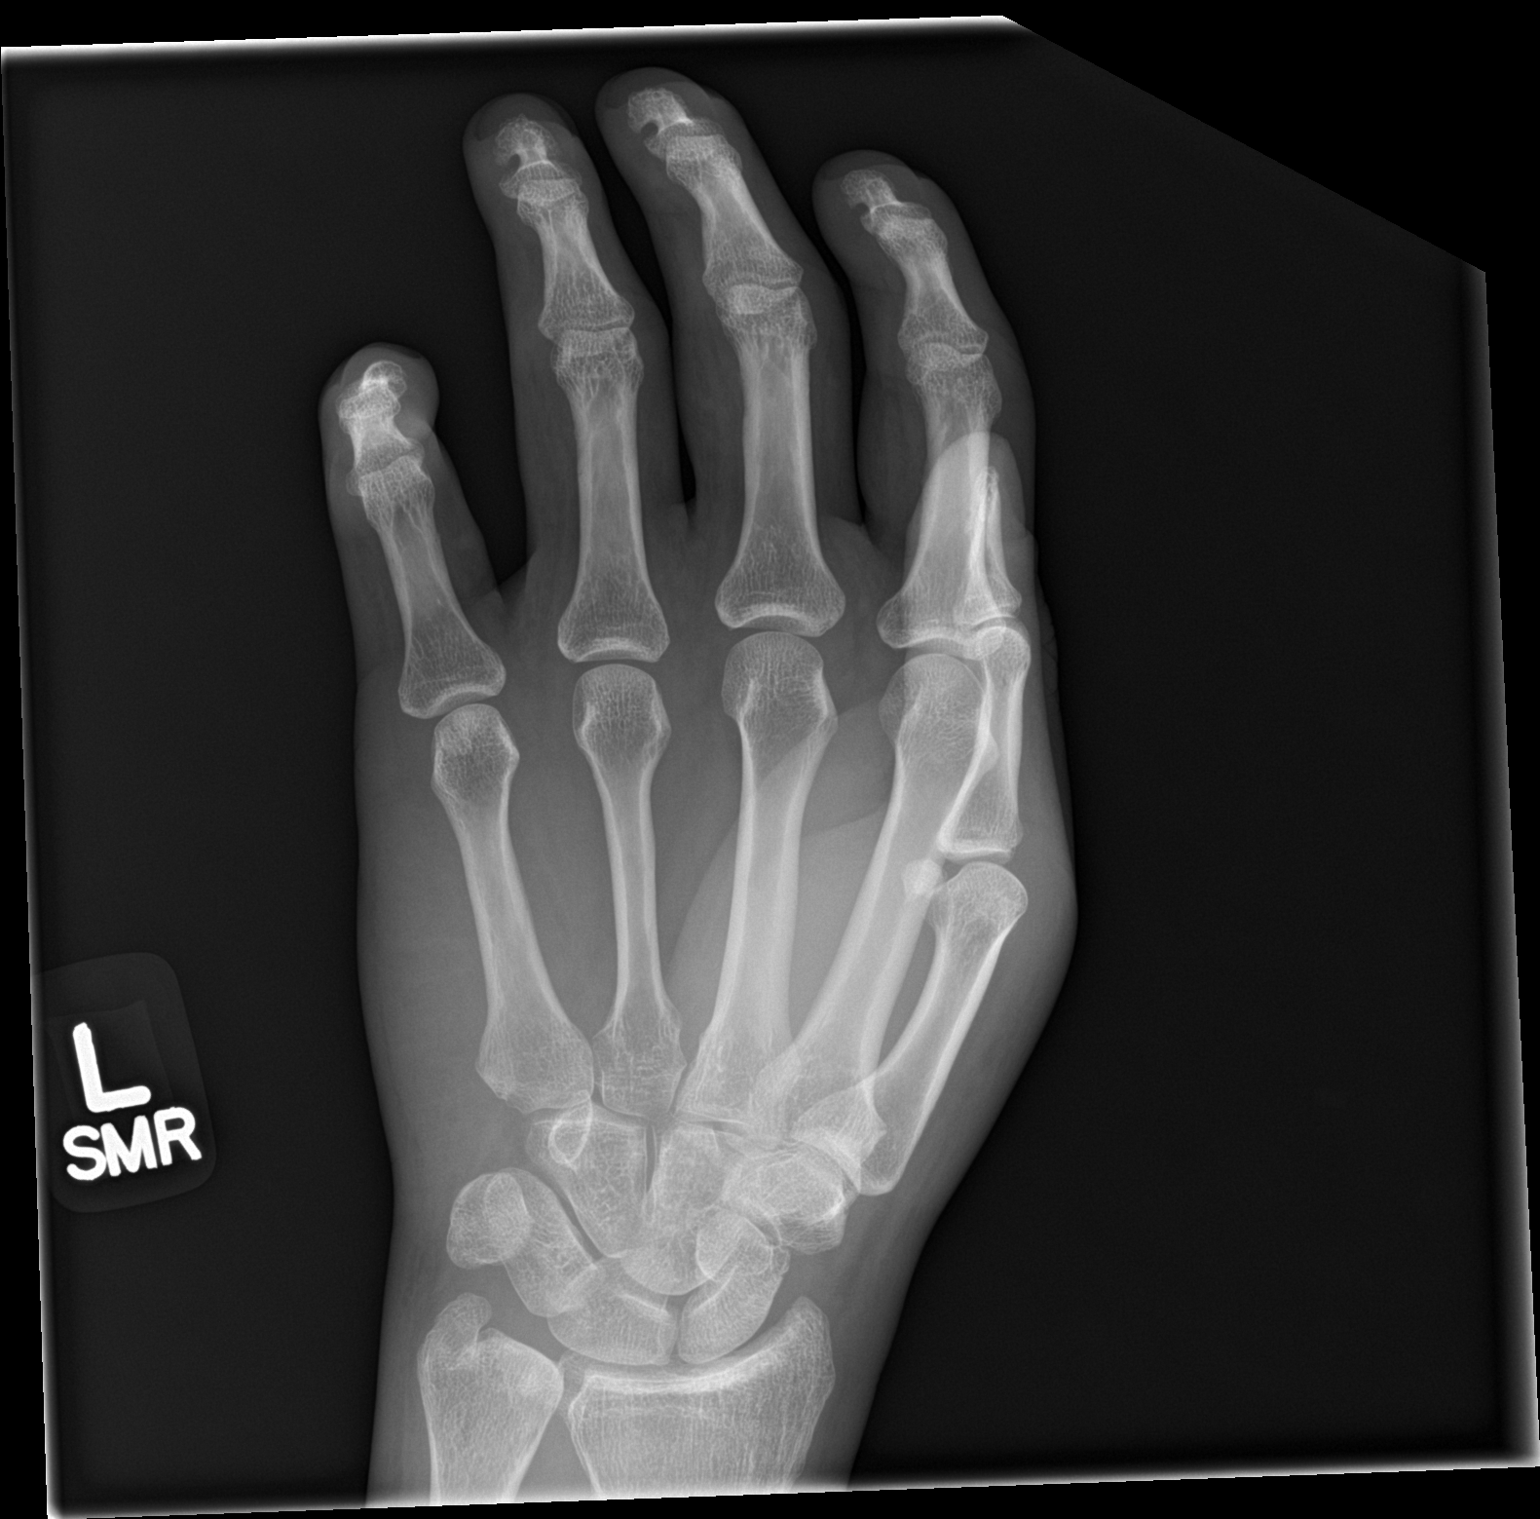

[hand obl]
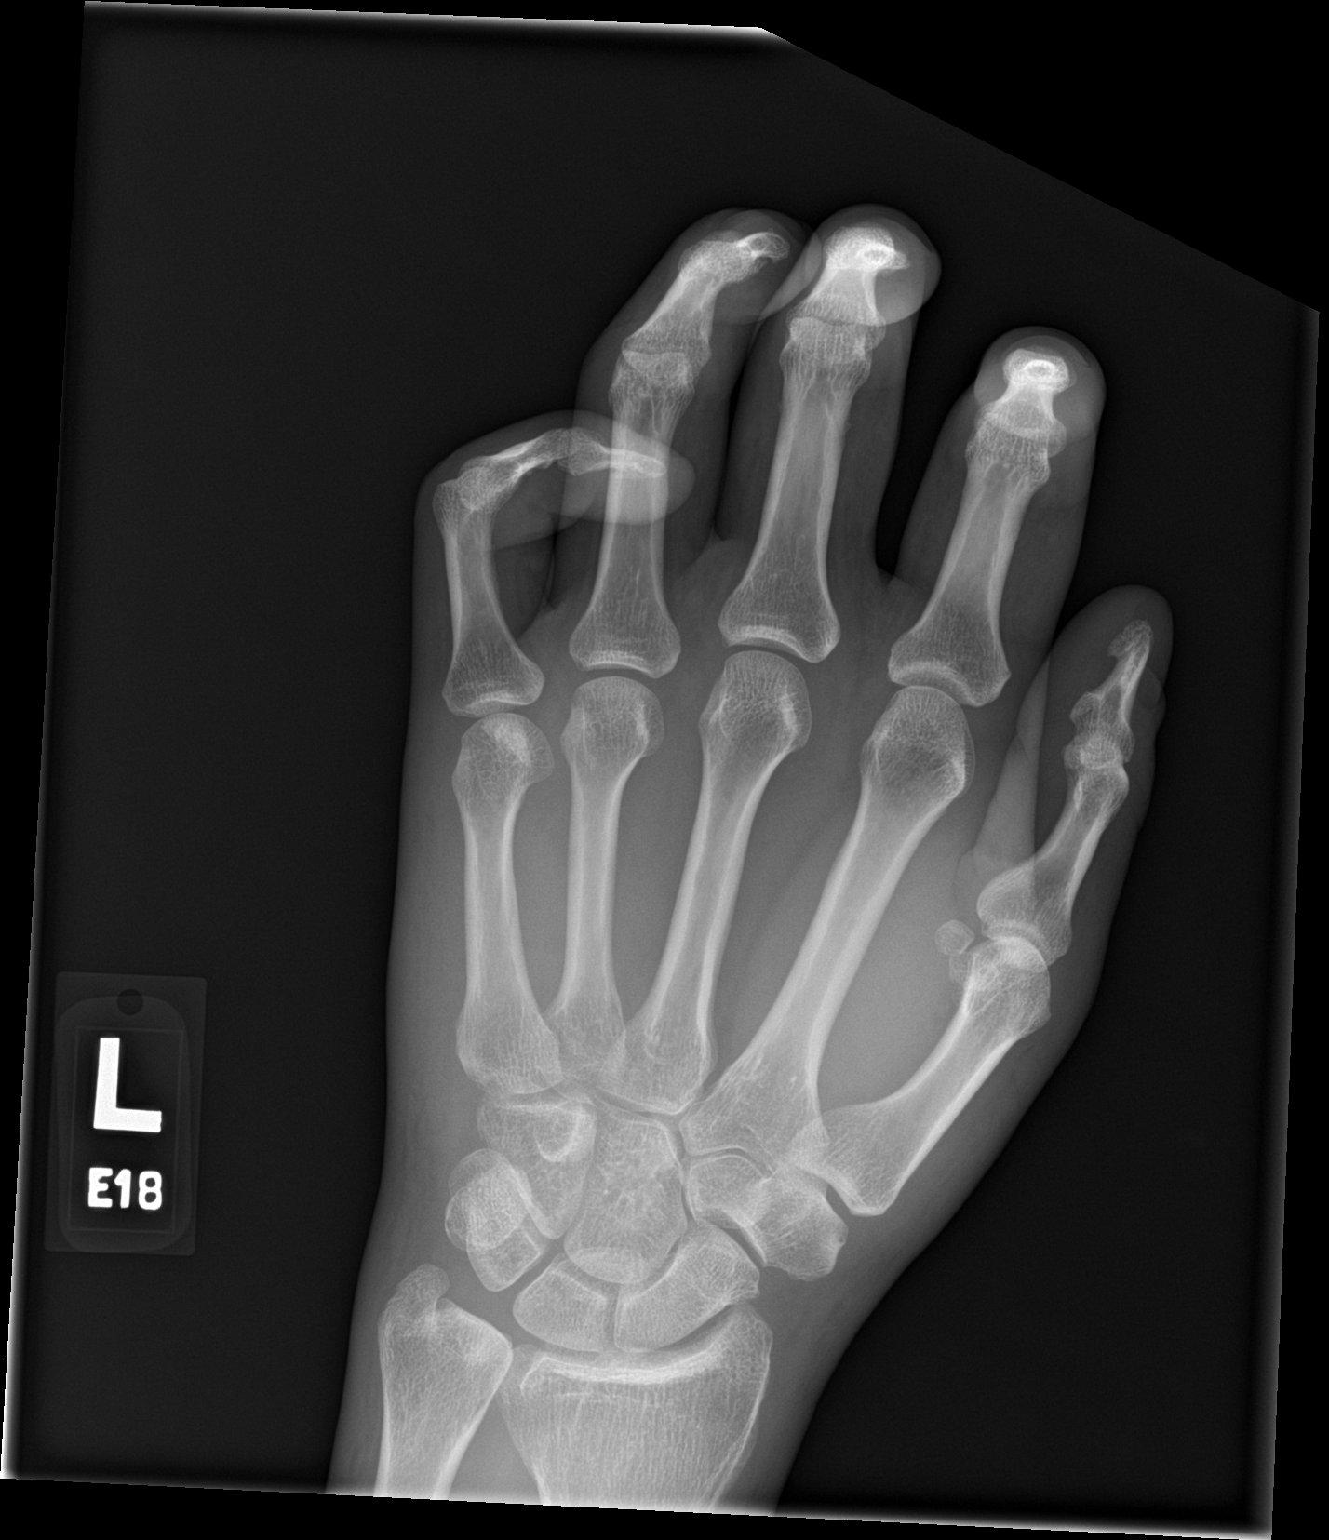

[hand lat]
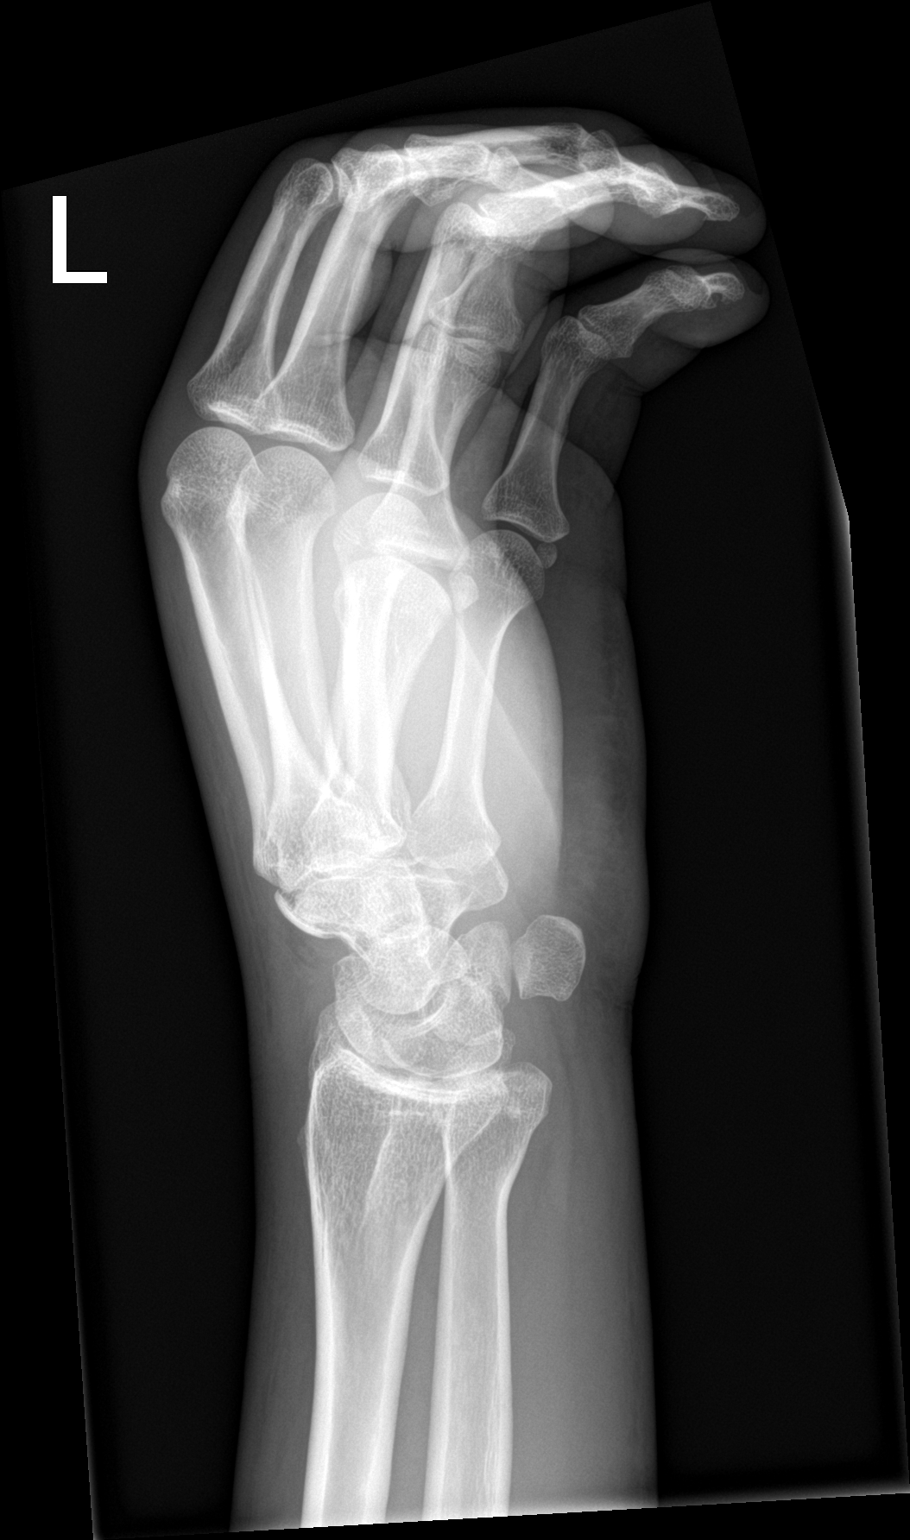

[3 of 3 positions shown; findings below may reference images not displayed]

FINDINGS: There is no evidence of fracture or dislocation. There is no
evidence of arthropathy or other focal bone abnormality. Soft tissue
swelling is present over the dorsum of the hand and wrist without
underlying fracture or foreign body.
IMPRESSION: Soft tissue swelling over the dorsum of the hand and wrist without
underlying fracture or foreign body.

## 2021-06-30 MED ORDER — LORAZEPAM 1 MG PO TABS: 0.0000 mg | ORAL_TABLET | Freq: Two times a day (BID) | ORAL | Status: DC

## 2021-06-30 MED ORDER — THIAMINE HCL 100 MG/ML IJ SOLN: 100.0000 mg | Freq: Every day | INTRAMUSCULAR | Status: DC

## 2021-06-30 MED ORDER — DEXTROSE 5 % IV SOLN
1500.0000 mg | Freq: Once | INTRAVENOUS | Status: AC
  Administered 2021-07-01: 1500 mg via INTRAVENOUS
  Filled 2021-06-30: qty 75

## 2021-06-30 MED ORDER — LORAZEPAM 2 MG/ML IJ SOLN
0.0000 mg | Freq: Two times a day (BID) | INTRAMUSCULAR | Status: DC
Start: 2021-07-03 — End: 2021-07-01

## 2021-06-30 MED ORDER — LORAZEPAM 2 MG/ML IJ SOLN: 0.0000 mg | Freq: Four times a day (QID) | INTRAMUSCULAR | Status: DC

## 2021-06-30 MED ORDER — CLINDAMYCIN PHOSPHATE 600 MG/50ML IV SOLN: 600.0000 mg | Freq: Once | INTRAVENOUS | Status: DC

## 2021-06-30 MED ORDER — THIAMINE HCL 100 MG PO TABS: 100.0000 mg | ORAL_TABLET | Freq: Every day | ORAL | Status: DC

## 2021-06-30 MED ORDER — LORAZEPAM 1 MG PO TABS
0.0000 mg | ORAL_TABLET | Freq: Four times a day (QID) | ORAL | Status: DC
  Administered 2021-07-01: 1 mg via ORAL
  Filled 2021-06-30: qty 1

## 2021-06-30 NOTE — ED Provider Notes (Signed)
[facility] EMERGENCY DEPARTMENT Provider Note   CSN: [id number] Arrival date & time: 06/30/21  1519     History No chief complaint on file.   Gary [name] is a 35 y.o. male with a past medical history of HIV, alcohol and polysubstance abuse who presents emergency department with complaint of suicidal and homicidal ideation along with alcohol withdrawals.  The patient is currently clearly intoxicated.  He is very somnolent and difficult to arouse.  I am unable to obtain significant history from the patient at this time he does complain of burning chest pain from vomiting..  I asked him how much alcohol he has had today and he stated "not enough."  The patient has a markedly edematous left hand and forearm and is unable to tell me about any previous injuries or other reasons why the left hand might be swollen and painful.  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   Malnutrition of moderate degree 05/07/2020   Gastrointestinal hemorrhage    Alcoholic hepatitis without ascites    Melena 05/05/2020   Alcoholic ketoacidosis 05/05/2020   Thrombocytopenia (HCC) 07/25/2019   Transaminitis 07/25/2019   Traumatic subdural hematoma (HCC) 07/02/2019   Alcohol abuse with intoxication (HCC) 07/02/2019   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   Alcohol use disorder, severe, dependence (HCC) 04/16/2019   Major depressive disorder, 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   HIV  disease (HCC) 06/16/2018   Polysubstance abuse (HCC) 06/16/2018   Cigarette smoker 06/16/2018    Past Surgical History:  Procedure Laterality Date   INCISION AND DRAINAGE PERIRECTAL ABSCESS N/A 09/24/2016   Procedure: IRRIGATION AND DEBRIDEMENT PERIRECTAL ABSCESS;  Surgeon: Ricarda Frame, MD;  Location: [facility] ORS;  Service: General;  Laterality: N/A;   none         Family History  Problem Relation Age of Onset   Alcohol abuse Mother    Alcohol abuse Father     Social History   Tobacco Use   Smoking status: Every Day    Packs/day: 0.50    Types: Cigarettes   Smokeless tobacco: Never   Tobacco comments:    unable to smoke while incarcerated 6+ months 02/13/20  Vaping Use   Vaping Use: Never used  Substance Use Topics   Alcohol use: Yes    Comment: "I drink all I can"   Drug use: Not Currently    Types: Marijuana, Methamphetamines    Comment: last used 04/10/2019    Home Medications Prior to Admission medications   Medication Sig Start Date End Date Taking? Authorizing Provider  bictegravir-emtricitabine-tenofovir AF (BIKTARVY) 50-200-25 MG TABS tablet Take 1 tablet by mouth daily. 05/13/21   Kuppelweiser, Cassie L, RPH-CPP  chlordiazePOXIDE (LIBRIUM) 25 MG capsule 50mg  PO TID x 1D, then 25-50mg  PO BID X 1D, then 25-50mg  PO QD X 1D 06/29/21   Long, 07/01/21, MD  ondansetron (ZOFRAN ODT) 4 MG disintegrating tablet Take 1 tablet (4 mg total) by mouth every  8 (eight) hours as needed for nausea or vomiting. 06/29/21   Long, Arlyss Repress, MD  famotidine (PEPCID) 20 MG tablet Take 1 tablet (20 mg total) by mouth 2 (two) times daily. Patient not taking: Reported on 06/01/2019 05/14/19 06/02/19  Benjiman Core, MD    Allergies    Tegretol [carbamazepine] and Caffeine  Review of Systems   Review of Systems Ten systems reviewed and are negative for acute change, except as noted in the HPI.   Physical Exam Updated Vital Signs BP (!) 134/92 (BP Location: Left Arm)   Pulse 93   Temp  98.9 F (37.2 C) (Oral)   Resp 16   SpO2 97%   Physical Exam Vitals and nursing note reviewed.  Constitutional:      General: He is not in acute distress.    Appearance: He is well-developed. He is not diaphoretic.     Comments: Red complexion Somnolent, awake awakes to loud voice and prompting  HENT:     Head: Normocephalic and atraumatic.  Eyes:     General: No scleral icterus.    Conjunctiva/sclera: Conjunctivae normal.     Pupils: Pupils are equal, round, and reactive to light.  Cardiovascular:     Rate and Rhythm: Normal rate and regular rhythm.     Heart sounds: Normal heart sounds.  Pulmonary:     Effort: Pulmonary effort is normal. No respiratory distress.     Breath sounds: Normal breath sounds.  Abdominal:     Palpations: Abdomen is soft.     Tenderness: There is no abdominal tenderness.  Musculoskeletal:        General: Swelling present.     Cervical back: Normal range of motion and neck supple.     Comments: Significant global swelling, erythema and tenderness of the left hand.  There appears to be a puncture wound on the palmar surface.  There is extensive swelling to the medial left forearm.  Skin:    General: Skin is warm and dry.  Neurological:     Mental Status: He is lethargic.  Psychiatric:        Behavior: Behavior normal.    ED Results / Procedures / Treatments   Labs (all labs ordered are listed, but only abnormal results are displayed) Labs Reviewed  RESP PANEL BY RT-PCR (FLU A&B, COVID) ARPGX2  COMPREHENSIVE METABOLIC PANEL  ETHANOL  RAPID URINE DRUG SCREEN, HOSP PERFORMED  CBC WITH DIFFERENTIAL/PLATELET  ACETAMINOPHEN LEVEL  SALICYLATE LEVEL  CK    EKG None  Radiology No results found.  Procedures Procedures   Medications Ordered in ED Medications - No data to display  ED Course  I have reviewed the triage vital signs and the nursing notes.  Pertinent labs & imaging results that were available during my care of the patient  were reviewed by me and considered in my medical decision making (see chart for details).  Clinical Course as of 07/01/21 0009  Mon Jun 30, 2021  1726 PT ETOH level 381 [AH]    Clinical Course User Index [AH] Arthor Captain, PA-C   MDM Rules/Calculators/A&P                         35 year old male who is well-known to this emergency department with obvious alcohol intoxication and ethanol level of 381 hook is complaining of alcohol withdrawal.  I do not feel that assessment for suicidal or homicidal ideation is of any value with an alcohol level that high.  Patient will be left to metabolize his alcohol and then be reassessed when he is clinically sober.  Of note patient does appear to have a cellulitis lightest of the left hand.  I have ordered [facility] for treatment.  I have reviewed the patient's lab which show mild hypokalemia, mildly elevated CK likely due to the cellulitis.  White blood cell count is within normal limits.  I ordered and reviewed a left hand and forearm x-ray which showed no acute abnormalities.  Patient is on CIWA protocol.  Signout given to PA up still who will assume care of the patient.   Final Clinical Impression(s) / ED Diagnoses Final diagnoses:  None    Rx / DC Orders ED Discharge Orders     None        Arthor Captain, PA-C 07/01/21 0011    Derwood Kaplan, MD 07/01/21 [phone]

## 2021-06-30 NOTE — ED Triage Notes (Signed)
Patient arrived by [facility] after being picked up outside fast food restaurant. Patient threatening to hit staff and kill himself if he isnt helped. Patient seen multiple times for same and reports last ETOH just prior to arrival

## 2021-06-30 NOTE — ED Notes (Signed)
Lab will add on CK

## 2021-06-30 NOTE — ED Provider Notes (Signed)
Emergency Medicine Provider Triage Evaluation Note  AMRAM MAYA , a 35 y.o. male  was evaluated in triage.  Pt complains of being mentally unstable.  He started yelling "security" in triage.  He is able to be redirected.  He states that he wants mental health help. .  Review of Systems  Positive: SI, HI, Alcohol abuse Negative:   Physical Exam  There were no vitals taken for this visit. Gen:   Awake, agitated Resp:  Normal effort  MSK:   Moves extremities without difficulty Other:  Vague HI, glares at staff.    Medical Decision Making  Medically screening exam initiated at 3:32 PM.  Appropriate orders placed.  CHRISOTPHER RIVERO was informed that the remainder of the evaluation will be completed by another provider, this initial triage assessment does not replace that evaluation, and the importance of remaining in the ED until their evaluation is complete.  Note: Portions of this report may have been transcribed using voice recognition software. Every effort was made to ensure accuracy; however, inadvertent computerized transcription errors may be present    Norman Clay 06/30/21 1628    Tegeler, Canary Brim, MD 06/30/21 [phone]

## 2021-06-30 NOTE — ED Provider Notes (Signed)
Patient care signed out at end of shift to me from [place], [place].  Well known to the emergency department with severe alcohol dependence Presents by EMS from the street, stating SI/HI if he doesn't get help Plan: Metabolize, ETOH 381 CIWA protocol in place Reassess when less intoxicated for SI/HI  2:20 - patient awake, requesting something for rib pain and nausea. No vomiting. VSS. Zofran, Tylenol ordered. CIWA score 4.  6:30 - patient has been sleeping through the night. VS persistently normal, no hypertension, hyperthermia, tachycardia. No seizure activity. He has a mild tremor and received 1 mg Ativan at 5:30 per protocol.   He is overall stable. Normal VS reassuring. He is oriented, calm, cooperative. He denies SI/HI. Will offer librium, provide outpatient referrals for alcohol treatment.      Elpidio Anis, PA-C 07/01/21 [id number]    Derwood Kaplan, MD 07/01/21 [phone]

## 2021-06-30 NOTE — Progress Notes (Signed)
Pharmacy Note:  Dalbavancin for Acute Bacterial Skin and Skin Structure Infection (ABSSSI) Patients to [facility] Discharge  Gary [name] is an 35 y.o. male who presented to [facility] on 06/30/2021 with an Acute Bacterial Skin and Skin Structure Infection. Patient has a history of alcohol misuse. Patient is felt to be a poor candidate for admission per ED attending. Confirmed with ED MD that dalbavancin receipt is felt to allow discharge and prevent admission.  Inclusion criteria - Indication []  Moderately large skin lesion (>=75 cm2 or larger - about the size of a baseball) [x]  Cellulitis  Inclusion Criteria - at least one SIRS criteria present []  WBC > 12,000 or < 4000 []  temp >100.9 or < 96.8 [x]  heart rate >90[]  respiratory rate >20  Patient was evaluated for the following exclusion criteria and no exclusions were found  , PharmD, BCPS 06/30/2021 9:24 PM ED Clinical Pharmacist -  [phone]

## 2021-07-01 MED ORDER — CHLORDIAZEPOXIDE HCL 25 MG PO CAPS: ORAL_CAPSULE | ORAL | 0 refills | Status: DC

## 2021-07-01 MED ORDER — ONDANSETRON 4 MG PO TBDP
4.0000 mg | ORAL_TABLET | Freq: Three times a day (TID) | ORAL | Status: DC | PRN
  Administered 2021-07-01: 4 mg via ORAL
  Filled 2021-07-01: qty 1

## 2021-07-01 MED ORDER — LORAZEPAM 1 MG PO TABS
1.0000 mg | ORAL_TABLET | Freq: Once | ORAL | Status: AC
  Administered 2021-07-01: 1 mg via ORAL
  Filled 2021-07-01: qty 1

## 2021-07-01 MED ORDER — ACETAMINOPHEN 500 MG PO TABS
1000.0000 mg | ORAL_TABLET | Freq: Four times a day (QID) | ORAL | Status: DC | PRN
  Administered 2021-07-01: 1000 mg via ORAL
  Filled 2021-07-01: qty 2

## 2021-07-01 NOTE — ED Notes (Addendum)
Pt given bus pass and d/c instructions. Pt agreeable to discharge and understands his instructions. Pt going to [facility] to pick up prescription. Pt states he does not want to harm himself. Pt given all his belongings for discharge.

## 2021-07-01 NOTE — Discharge Instructions (Addendum)
Fill the prescription for Librium which will help with symptoms of alcohol withdrawal. This has been called into [facility] [address].   Follow up with the resources provided for alcohol treatment programs.

## 2021-07-02 ENCOUNTER — Emergency Department (HOSPITAL_COMMUNITY)
Admission: EM | Admit: 2021-07-02 | Discharge: 2021-07-02 | Disposition: A | Discharge status: Home / Self Care | Payer: Self-pay | Attending: Emergency Medicine | Admitting: Emergency Medicine

## 2021-07-02 ENCOUNTER — Other Ambulatory Visit: Payer: Self-pay

## 2021-07-02 ENCOUNTER — Encounter (HOSPITAL_COMMUNITY): Payer: Self-pay | Admitting: Emergency Medicine

## 2021-07-02 DIAGNOSIS — R451 Restlessness and agitation: Secondary | ICD-10-CM | POA: Insufficient documentation

## 2021-07-02 DIAGNOSIS — Z21 Asymptomatic human immunodeficiency virus [HIV] infection status: Secondary | ICD-10-CM | POA: Insufficient documentation

## 2021-07-02 DIAGNOSIS — F10120 Alcohol abuse with intoxication, uncomplicated: Secondary | ICD-10-CM | POA: Insufficient documentation

## 2021-07-02 DIAGNOSIS — F10920 Alcohol use, unspecified with intoxication, uncomplicated: Secondary | ICD-10-CM

## 2021-07-02 DIAGNOSIS — F17210 Nicotine dependence, cigarettes, uncomplicated: Secondary | ICD-10-CM | POA: Insufficient documentation

## 2021-07-02 DIAGNOSIS — R45851 Suicidal ideations: Secondary | ICD-10-CM | POA: Insufficient documentation

## 2021-07-02 NOTE — ED Triage Notes (Signed)
BIBA Per EMS: Patient c/o alcohol withdrawal. Psychiatric eval.  Refuse vitals from EMS

## 2021-07-02 NOTE — Discharge Instructions (Addendum)
Upon your release from jail please be sure to follow-up with your physician.  Please use previously provided resources for assistance with alcohol addiction.  Return here for concerning changes in your condition.

## 2021-07-02 NOTE — ED Provider Notes (Signed)
[facility]-EMERGENCY DEPT Provider Note   CSN: [id number] Arrival date & time: 07/02/21  1600     History Chief Complaint  Patient presents with   Alcohol Intoxication   Psychiatric Evaluation    Gary [name] is a 35 y.o. male.  HPI Patient with a history of alcohol abuse, 31 prior ED visits in the past 6 months presents via [employer].  History is obtained by the patient and police. Patient notes that he was seeking assistance for his alcohol abuse, suicidal thoughts.  He was being escorted by police, when he reportedly became agitated, tried to punch one of them.  He was subsequently tased.  Police officers note that the patient then stated that he felt better, had no more suicidal ideation, but with persistent agitation, he was brought here for evaluation.  Patient denies pain anywhere, acknowledges alcohol addiction/abuse, denies suicidal ideation.  No reported hemodynamic instability, trauma beyond taser.     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   Malnutrition of moderate degree 05/07/2020   Gastrointestinal hemorrhage    Alcoholic hepatitis without ascites    Melena 05/05/2020   Alcoholic ketoacidosis 05/05/2020   Thrombocytopenia (HCC) 07/25/2019   Transaminitis 07/25/2019   Traumatic subdural hematoma (HCC) 07/02/2019   Alcohol abuse with intoxication (HCC) 07/02/2019   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   Alcohol use disorder, severe, dependence (HCC) 04/16/2019   Major  depressive disorder, 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    Past Surgical History:  Procedure Laterality Date   INCISION AND DRAINAGE PERIRECTAL ABSCESS N/A 09/24/2016   Procedure: IRRIGATION AND DEBRIDEMENT PERIRECTAL ABSCESS;  Surgeon: Ricarda Frame, MD;  Location: [facility] ORS;  Service: General;  Laterality: N/A;   none         Family History  Problem Relation Age of Onset   Alcohol abuse Mother    Alcohol abuse Father     Social History   Tobacco Use   Smoking status: Every Day    Packs/day: 0.50    Types: Cigarettes   Smokeless tobacco: Never   Tobacco comments:    unable to smoke while incarcerated 6+ months 02/13/20  Vaping Use   Vaping Use: Never used  Substance Use Topics   Alcohol use: Yes    Comment: "I drink all I can"   Drug use: Not Currently    Types: Marijuana, Methamphetamines    Comment: last used 04/10/2019    Home Medications Prior to Admission medications   Medication Sig Start Date End Date Taking? Authorizing Provider  bictegravir-emtricitabine-tenofovir AF (BIKTARVY) 50-200-25 MG TABS tablet Take 1 tablet by mouth daily. 05/13/21   Kuppelweiser, Cassie L, RPH-CPP  chlordiazePOXIDE (LIBRIUM) 25 MG capsule 50mg  PO TID x 1D, then 25-50mg  PO BID X 1D, then 25-50mg  PO QD X 1D 07/01/21   Upstill, Shari, PA-C  ondansetron (ZOFRAN ODT) 4 MG disintegrating tablet Take 1 tablet (4  mg total) by mouth every 8 (eight) hours as needed for nausea or vomiting. 06/29/21   Long, Arlyss Repress, MD  famotidine (PEPCID) 20 MG tablet Take 1 tablet (20 mg total) by mouth 2 (two) times daily. Patient not taking: Reported on 06/01/2019 05/14/19 06/02/19  Benjiman Core, MD    Allergies    Tegretol [carbamazepine] and Caffeine  Review of Systems   Review of Systems  Constitutional:        Per HPI, otherwise negative  HENT:         Per HPI, otherwise  negative  Respiratory:         Per HPI, otherwise negative  Cardiovascular:        Per HPI, otherwise negative  Gastrointestinal:  Negative for vomiting.  Endocrine:       Negative aside from HPI  Genitourinary:        Neg aside from HPI   Musculoskeletal:        Per HPI, otherwise negative  Skin:  Positive for wound.  Allergic/Immunologic: Positive for immunocompromised state.  Neurological:  Negative for syncope.  Psychiatric/Behavioral:  Positive for suicidal ideas.    Physical Exam Updated Vital Signs There were no vitals taken for this visit.  Physical Exam Vitals and nursing note reviewed.  Constitutional:      General: He is not in acute distress.    Appearance: He is well-developed.  HENT:     Head: Normocephalic and atraumatic.  Eyes:     Conjunctiva/sclera: Conjunctivae normal.  Cardiovascular:     Rate and Rhythm: Normal rate and regular rhythm.  Pulmonary:     Effort: Pulmonary effort is normal. No respiratory distress.     Breath sounds: No stridor.  Abdominal:     General: There is no distension.     Tenderness: There is no abdominal tenderness. There is no guarding.  Skin:    General: Skin is warm and dry.       Neurological:     Mental Status: He is alert and oriented to person, place, and time.  Psychiatric:        Mood and Affect: Mood is anxious. Affect is labile.        Thought Content: Thought content does not include suicidal ideation.    ED Results / Procedures / Treatments   Labs (all labs ordered are listed, but only abnormal results are displayed) Labs Reviewed - No data to display  EKG None  Radiology DG Forearm Left  Result Date: 06/30/2021 CLINICAL DATA:  Swelling. EXAM: LEFT HAND - COMPLETE 3+ VIEW; LEFT FOREARM - 2 VIEW COMPARISON:  None. FINDINGS: There is no evidence of fracture or dislocation. There is no evidence of arthropathy or other focal bone abnormality. Soft tissue swelling is present over the dorsum of the hand and  wrist without underlying fracture or foreign body. IMPRESSION: Soft tissue swelling over the dorsum of the hand and wrist without underlying fracture or foreign body. Electronically Signed   By: Marin Roberts M.D.   On: 06/30/2021 17:36   DG Hand Complete Left  Result Date: 06/30/2021 CLINICAL DATA:  Swelling. EXAM: LEFT HAND - COMPLETE 3+ VIEW; LEFT FOREARM - 2 VIEW COMPARISON:  None. FINDINGS: There is no evidence of fracture or dislocation. There is no evidence of arthropathy or other focal bone abnormality. Soft tissue swelling is present over the dorsum of the hand and wrist without underlying fracture or foreign body. IMPRESSION: Soft tissue swelling over the dorsum of the hand  and wrist without underlying fracture or foreign body. Electronically Signed   By: Marin Roberts M.D.   On: 06/30/2021 17:36    Procedures .Foreign Body Removal  Date/Time: 07/02/2021 4:29 PM Performed by: Gerhard Munch, MD Authorized by: Gerhard Munch, MD  Consent: The procedure was performed in an emergent situation. Verbal consent obtained. Risks and benefits: risks, benefits and alternatives were discussed Consent given by: patient Patient understanding: patient states understanding of the procedure being performed Patient consent: the patient's understanding of the procedure matches consent given Procedure consent: procedure consent matches procedure scheduled Site marked: the operative site was marked Required items: required blood products, implants, devices, and special equipment available Patient identity confirmed: verbally with patient Body area: skin General location: trunk Location details: abdomen  Sedation: Patient sedated: no  Patient restrained: no Patient cooperative: yes Localization method: visualized Removal mechanism: manual removal. Dressing: dressing applied Tendon involvement: none Depth: subcutaneous Complexity: simple 1 objects recovered. Objects  recovered: taser prong Post-procedure assessment: foreign body removed Patient tolerance: patient tolerated the procedure well with no immediate complications    Medications Ordered in ED Medications - No data to display  ED Course  I have reviewed the triage vital signs and the nursing notes.  Pertinent labs & imaging results that were available during my care of the patient were reviewed by me and considered in my medical decision making (see chart for details).  Chart review notable for 31 prior ED visits, discharged 2 days ago after presentation for alcohol addiction/abuse with initial description of suicidal ideation which resolved while the patient became sober.  4:31 PM Patient is awake, alert, sitting upright.  He has no evidence for trauma, taser prong has been removed, he has no evidence for peritonitis, infection.  Patient appropriate for discharge into police custody. Final Clinical Impression(s) / ED Diagnoses Final diagnoses:  Acute alcoholic intoxication without complication (HCC)  Agitation     Gerhard Munch, MD 07/02/21 [phone]

## 2021-07-03 ENCOUNTER — Other Ambulatory Visit: Payer: Self-pay

## 2021-07-03 ENCOUNTER — Encounter (HOSPITAL_COMMUNITY): Payer: Self-pay

## 2021-07-03 ENCOUNTER — Emergency Department (HOSPITAL_COMMUNITY): Payer: Self-pay

## 2021-07-03 ENCOUNTER — Emergency Department (HOSPITAL_COMMUNITY)
Admission: EM | Admit: 2021-07-03 | Discharge: 2021-07-04 | Disposition: A | Discharge status: Home / Self Care | Payer: Self-pay | Attending: Emergency Medicine | Admitting: Emergency Medicine

## 2021-07-03 ENCOUNTER — Emergency Department (HOSPITAL_COMMUNITY)
Admission: EM | Admit: 2021-07-03 | Discharge: 2021-07-03 | Disposition: A | Discharge status: Home / Self Care | Payer: Self-pay | Attending: Emergency Medicine | Admitting: Emergency Medicine

## 2021-07-03 DIAGNOSIS — F17210 Nicotine dependence, cigarettes, uncomplicated: Secondary | ICD-10-CM | POA: Insufficient documentation

## 2021-07-03 DIAGNOSIS — Z5321 Procedure and treatment not carried out due to patient leaving prior to being seen by health care provider: Secondary | ICD-10-CM | POA: Insufficient documentation

## 2021-07-03 DIAGNOSIS — F10929 Alcohol use, unspecified with intoxication, unspecified: Secondary | ICD-10-CM | POA: Insufficient documentation

## 2021-07-03 DIAGNOSIS — B20 Human immunodeficiency virus [HIV] disease: Secondary | ICD-10-CM | POA: Insufficient documentation

## 2021-07-03 DIAGNOSIS — R0789 Other chest pain: Secondary | ICD-10-CM | POA: Insufficient documentation

## 2021-07-03 DIAGNOSIS — F1010 Alcohol abuse, uncomplicated: Secondary | ICD-10-CM | POA: Insufficient documentation

## 2021-07-03 LAB — BASIC METABOLIC PANEL
Anion gap: 10 (ref 5–15)
BUN: 5 mg/dL — ABNORMAL LOW (ref 6–20)
CO2: 24 mmol/L (ref 22–32)
Calcium: 8.5 mg/dL — ABNORMAL LOW (ref 8.9–10.3)
Chloride: 107 mmol/L (ref 98–111)
Creatinine, Ser: 0.62 mg/dL (ref 0.61–1.24)
GFR, Estimated: >60 mL/min (ref >60)
Glucose, Bld: 100 mg/dL — ABNORMAL HIGH (ref 70–99)
Potassium: 3.7 mmol/L (ref 3.5–5.1)
Sodium: 141 mmol/L (ref 135–145)

## 2021-07-03 LAB — RAPID URINE DRUG SCREEN, HOSP PERFORMED
Amphetamines: NOT DETECTED
Barbiturates: NOT DETECTED
Benzodiazepines: NOT DETECTED
Cocaine: NOT DETECTED
Opiates: NOT DETECTED
Tetrahydrocannabinol: NOT DETECTED

## 2021-07-03 LAB — CBC
HCT: 38.2 % — ABNORMAL LOW (ref 39.0–52.0)
Hemoglobin: 13.2 g/dL (ref 13.0–17.0)
MCH: 32.8 pg (ref 26.0–34.0)
MCHC: 34.6 g/dL (ref 30.0–36.0)
MCV: 94.8 fL (ref 80.0–100.0)
Platelets: 111 10*3/uL — ABNORMAL LOW (ref 150–400)
RBC: 4.03 MIL/uL — ABNORMAL LOW (ref 4.22–5.81)
RDW: 13.2 % (ref 11.5–15.5)
WBC: 5.1 10*3/uL (ref 4.0–10.5)
nRBC: 0 % (ref 0.0–0.2)

## 2021-07-03 LAB — ETHANOL: Alcohol, Ethyl (B): 134 mg/dL — ABNORMAL HIGH (ref <10)

## 2021-07-03 LAB — TROPONIN I (HIGH SENSITIVITY): Troponin I (High Sensitivity): 6 ng/L (ref <18)

## 2021-07-03 IMAGING — CR DG CHEST 2V
3 series · 4 of 4 positions shown · non-contrast
Comparison: Chest radiographs [DATE] and earlier.

CLINICAL DATA: 34-year-old male with chest pain, possible alcohol
withdrawal.

EXAM:
CHEST - 2 VIEW

[Series 2: chest lat · 0.14mm/px · 2 of 2 slices shown]
[im 1/2]
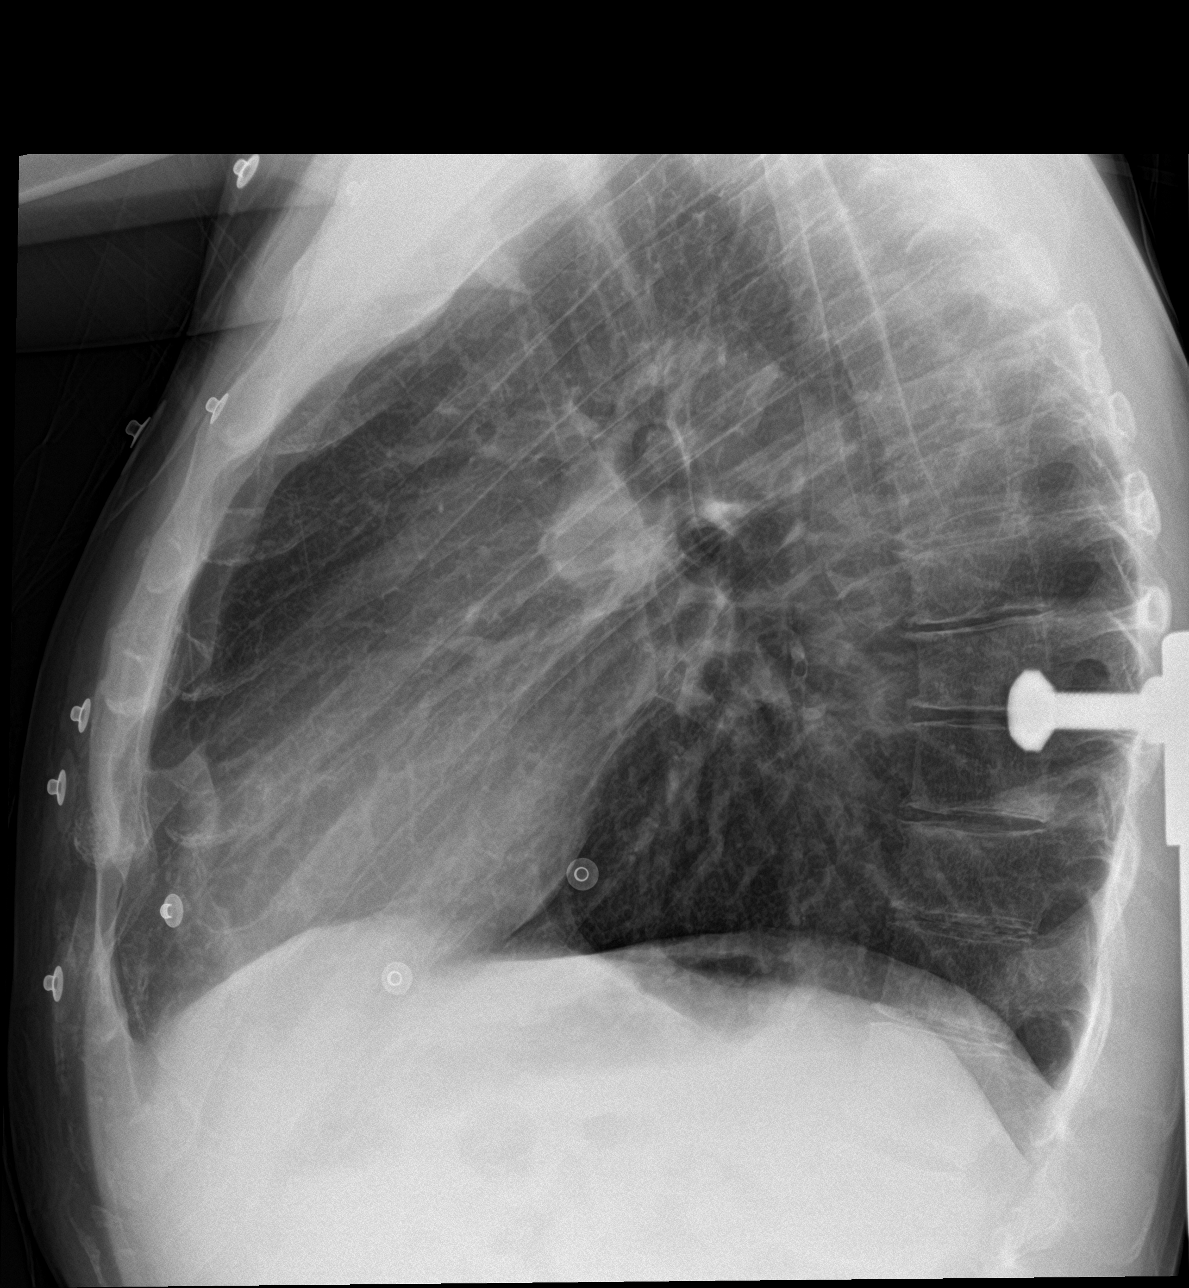
[im 2/2]
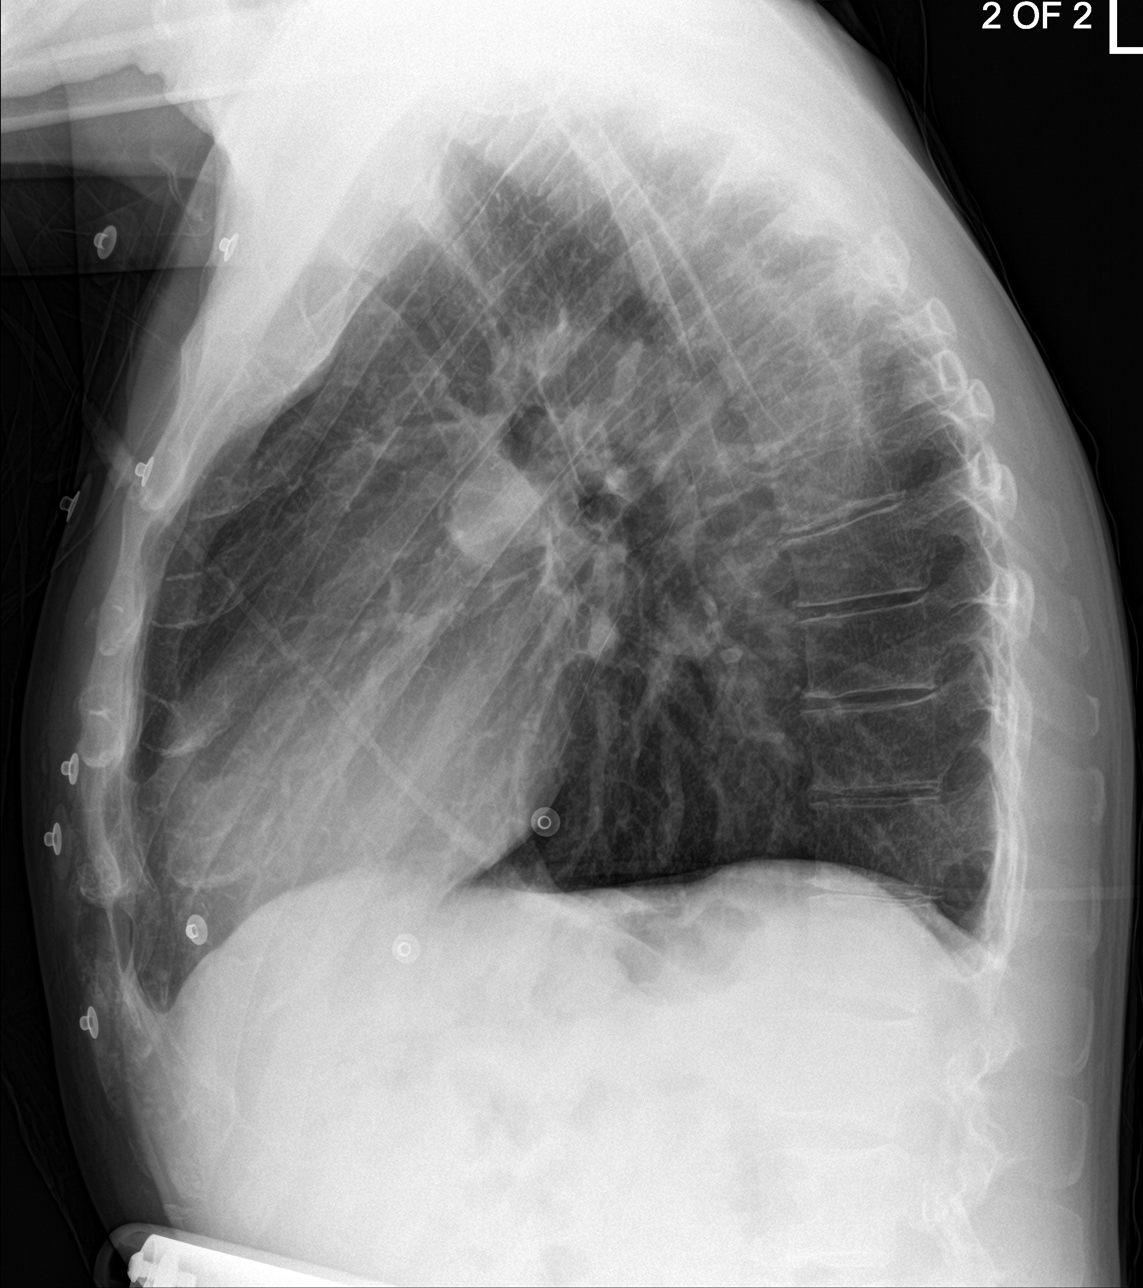

[chest ap (1 of 2)]
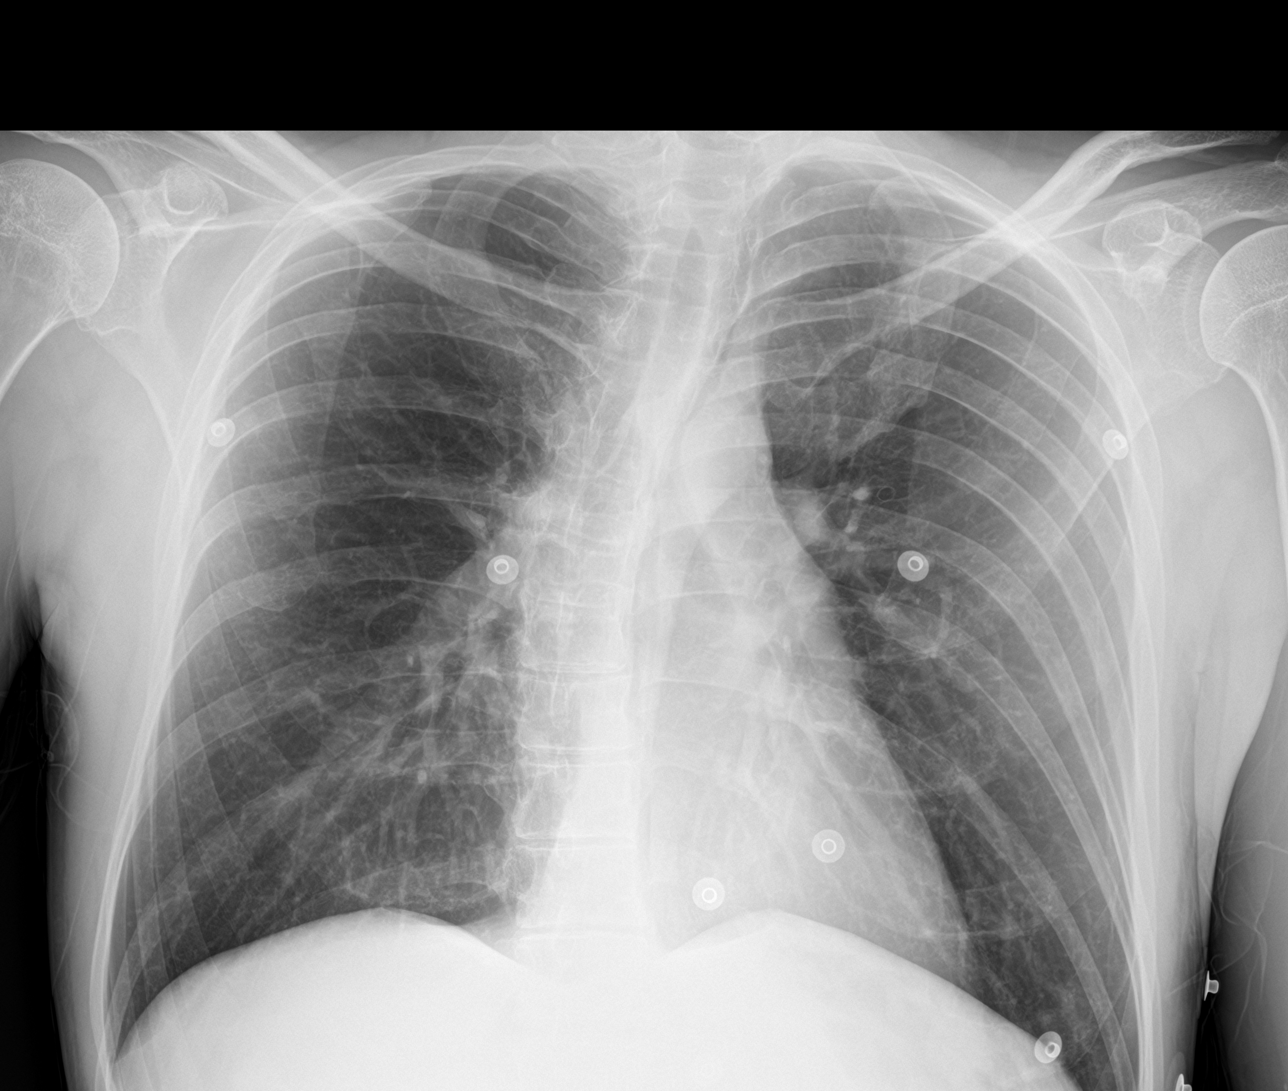

[chest ap (2 of 2)]
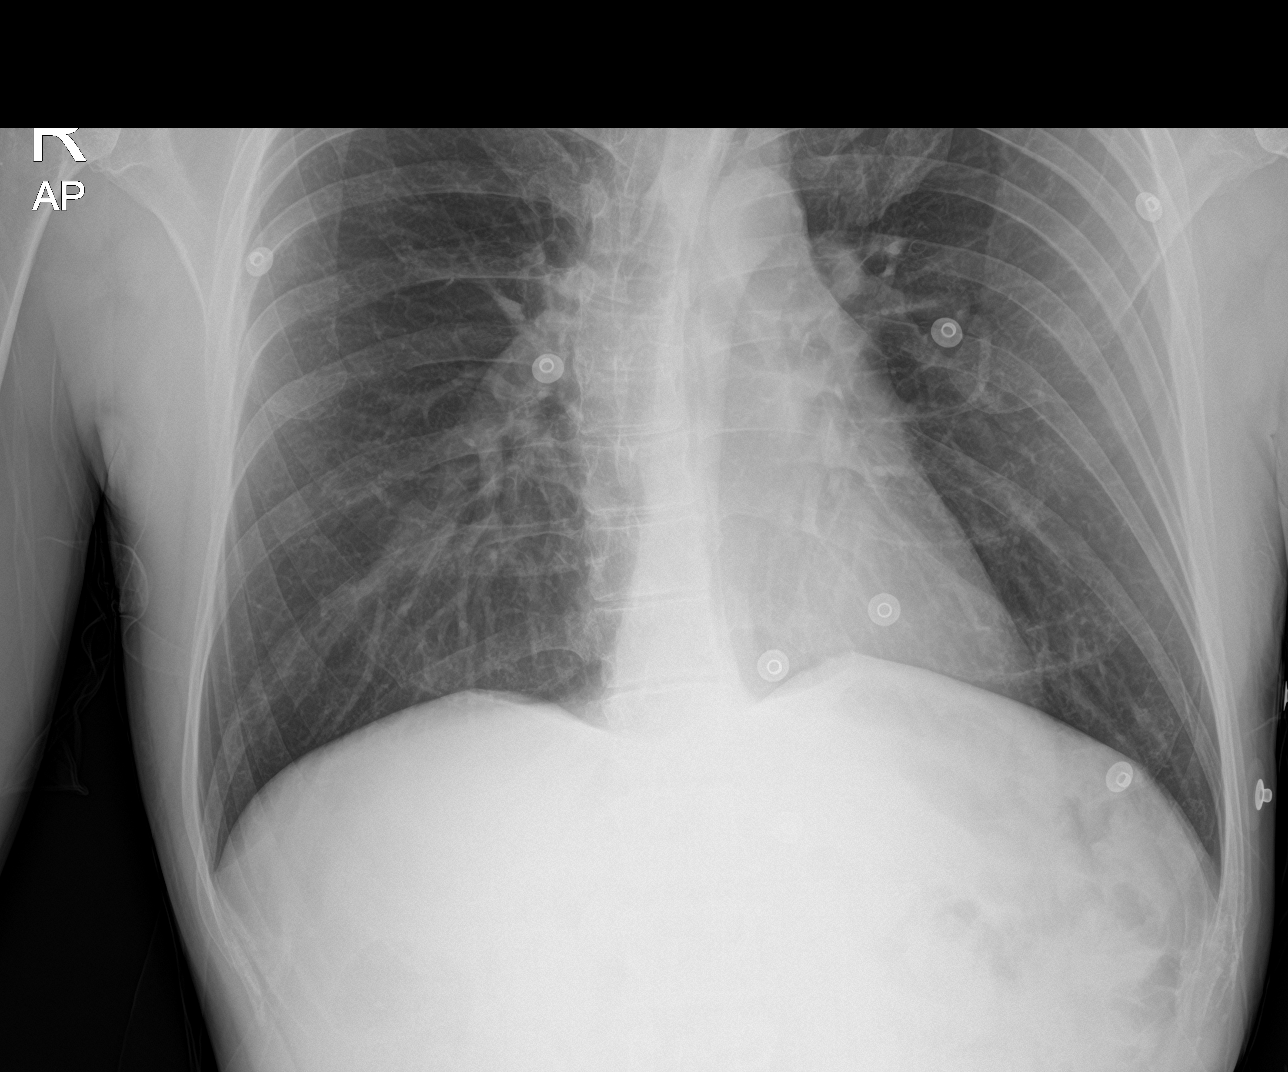

[4 of 4 positions shown; findings below may reference images not displayed]

FINDINGS: AP and lateral views of the chest. Lung volumes and mediastinal
contours remain within normal limits. Visualized tracheal air column
is within normal limits. Both lungs appear clear. No pneumothorax or
pleural effusion. Mild scoliosis. No acute osseous abnormality
identified. Paucity of bowel gas.
IMPRESSION: No acute cardiopulmonary abnormality.

## 2021-07-03 NOTE — ED Triage Notes (Signed)
Pt reports alcohol intoxication and wants detox. Pt says he is withdrawing. Also admits drinking much more than a fifth of alcohol tonight.

## 2021-07-03 NOTE — ED Triage Notes (Signed)
Pt bib [facility]. Pt's last drink was yesterday afternoon. Pt states he feels like he is going through withdrawals. Pt is having left sided chest pain that is typical of his withdrawals. Pt received ASA 324mg  PO, NTG x 1 and zofran 4mg  en route.

## 2021-07-03 NOTE — ED Notes (Signed)
Pt able to ambulate to treatment room and for ER Provider.

## 2021-07-03 NOTE — ED Notes (Signed)
Pt given ginger ale and oral hydration.

## 2021-07-03 NOTE — ED Provider Notes (Signed)
[facility]-EMERGENCY DEPT Provider Note   CSN: [id number] Arrival date & time: 07/03/21  2218     History Chief Complaint  Patient presents with   Alcohol Intoxication    Gary [name] is a 35 y.o. male with a hx of HIV, EtOH abuse, and tobacco abuse who returns to the ER with alcohol intoxication requesting detox today. Patient states he is here due to concern for his alcohol use. He states he drank multiple bottles of wine today and a 40 ounce beer. He states his last drink was a couple of hours ago. He is worried about his drinking and said his boyfriend wanted him to get checked out. He denies chest pain, abdominal pain, vomiting, seizures, or syncope.      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   Malnutrition of moderate degree 05/07/2020   Gastrointestinal hemorrhage    Alcoholic hepatitis without ascites    Melena 05/05/2020   Alcoholic ketoacidosis 05/05/2020   Thrombocytopenia (HCC) 07/25/2019   Transaminitis 07/25/2019   Traumatic subdural hematoma (HCC) 07/02/2019   Alcohol abuse with intoxication (HCC) 07/02/2019   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    Past Surgical History:  Procedure Laterality Date    INCISION AND DRAINAGE PERIRECTAL ABSCESS N/A 09/24/2016   Procedure: IRRIGATION AND DEBRIDEMENT PERIRECTAL ABSCESS;  Surgeon: Ricarda Frame, MD;  Location: [facility] ORS;  Service: General;  Laterality: N/A;   none         Family History  Problem Relation Age of Onset   Alcohol abuse Mother    Alcohol abuse Father     Social History   Tobacco Use   Smoking status: Every Day    Packs/day: 0.50    Types: Cigarettes   Smokeless tobacco: Never   Tobacco comments:    unable to smoke while incarcerated 6+ months 02/13/20  Vaping Use   Vaping Use: Never used  Substance Use Topics   Alcohol use: Yes    Comment: "I drink all I can"   Drug use: Not Currently    Types: Marijuana, Methamphetamines    Comment: last used 04/10/2019    Home Medications Prior to Admission medications   Medication Sig Start Date End Date Taking? Authorizing Provider  bictegravir-emtricitabine-tenofovir AF (BIKTARVY) 50-200-25 MG TABS tablet Take 1 tablet by mouth daily. 05/13/21   Kuppelweiser, Cassie L, RPH-CPP  chlordiazePOXIDE (LIBRIUM) 25 MG capsule 50mg  PO TID x 1D, then 25-50mg  PO BID X 1D, then 25-50mg  PO QD X 1D 07/01/21   Upstill, Shari, PA-C  ondansetron (ZOFRAN ODT) 4 MG disintegrating tablet Take 1 tablet (4 mg total) by mouth every 8 (eight) hours as needed for nausea or vomiting. 06/29/21   Long, 07/01/21, MD  famotidine (PEPCID) 20 MG tablet  Take 1 tablet (20 mg total) by mouth 2 (two) times daily. Patient not taking: Reported on 06/01/2019 05/14/19 06/02/19  Benjiman Core, MD    Allergies    Tegretol [carbamazepine] and Caffeine  Review of Systems   Review of Systems  Constitutional:  Negative for fever.  Respiratory:  Negative for shortness of breath.   Cardiovascular:  Negative for chest pain.  Gastrointestinal:  Negative for abdominal pain and vomiting.  Neurological:  Negative for seizures and syncope.  Psychiatric/Behavioral:  Negative for suicidal ideas. The patient is nervous/anxious.    All other systems reviewed and are negative.  Physical Exam Updated Vital Signs BP (!) 130/94 (BP Location: Left Arm)   Pulse (!) 112   Temp 98.4 F (36.9 C) (Oral)   Resp 14   Ht 6' (1.829 m)   Wt 72.6 kg   SpO2 95%   BMI 21.70 kg/m   Physical Exam Vitals and nursing note reviewed.  Constitutional:      General: He is not in acute distress.    Appearance: He is well-developed. He is not toxic-appearing.  HENT:     Head: Normocephalic and atraumatic.  Eyes:     General:        Right eye: No discharge.        Left eye: No discharge.     Conjunctiva/sclera: Conjunctivae normal.  Cardiovascular:     Rate and Rhythm: Normal rate and regular rhythm.  Pulmonary:     Effort: Pulmonary effort is normal. No respiratory distress.     Breath sounds: Normal breath sounds. No wheezing, rhonchi or rales.  Abdominal:     General: There is no distension.     Palpations: Abdomen is soft.     Tenderness: There is no abdominal tenderness. There is no guarding or rebound.  Musculoskeletal:     Cervical back: Neck supple.  Skin:    General: Skin is warm and dry.  Neurological:     Mental Status: He is alert.     Comments: Clear speech. Ambulatory. No tremor.   Psychiatric:        Behavior: Behavior normal.    ED Results / Procedures / Treatments   Labs (all labs ordered are listed, but only abnormal results are displayed) Labs Reviewed - No data to display  EKG None  Radiology DG Chest 2 View  Result Date: 07/03/2021 CLINICAL DATA:  35 year old male with chest pain, possible alcohol withdrawal. EXAM: CHEST - 2 VIEW COMPARISON:  Chest radiographs 01/12/2021 and earlier. FINDINGS: AP and lateral views of the chest. Lung volumes and mediastinal contours remain within normal limits. Visualized tracheal air column is within normal limits. Both lungs appear clear. No pneumothorax or pleural effusion. Mild scoliosis. No acute osseous abnormality identified. Paucity of bowel gas.  IMPRESSION: No acute cardiopulmonary abnormality. Electronically Signed   By: Odessa Fleming M.D.   On: 07/03/2021 07:10    Procedures Procedures   Medications Ordered in ED Medications - No data to display  ED Course  I have reviewed the triage vital signs and the nursing notes.  Pertinent labs & imaging results that were available during my care of the patient were reviewed by me and considered in my medical decision making (see chart for details).    MDM Rules/Calculators/A&P                           Patient presents to the ED with concerns about his drinking.  He is nontoxic, resting comfortably, his initial tachycardia normalized on my exam into the 90s.  Vitals are otherwise unremarkable.  Patient currently has a CIWA of 3, he does not appear to be having significant acute withdrawal, exam not consistent with delirium tremens.  Will monitor in the emergency department  Additional history obtained:  Additional history obtained from chart review & nursing note review.  Patient had blood work earlier today which included CBC, BMP, and troponin which was similar to prior labs he has had done.  ED Course:  Patient remains with reassuring vital signs and reassuring serial assessments.  Remains without signs of significant withdrawal.  He is ambulatory in the emergency department and tolerating p.o. without difficulty.  He is well-known to the emergency department, he appears in his usual state of health at this time, and overall appears appropriate for discharge.  Will provide prescription for Librium taper as well as outpatient resources. Consult placed to peer support.   I discussed treatment plan, need for follow-up, and return precautions with the patient. Provided opportunity for questions, patient confirmed understanding and is in agreement with plan.   Portions of this note were generated with Scientist, clinical (histocompatibility and immunogenetics). Dictation errors may occur despite best attempts at  proofreading.  Final Clinical Impression(s) / ED Diagnoses Final diagnoses:  ETOH abuse    Rx / DC Orders ED Discharge Orders     None        Cherly Anderson, PA-C 07/04/21 0458    Gilda Crease, MD 07/04/21 [phone]

## 2021-07-03 NOTE — ED Notes (Signed)
Patient went into the bathroom and removed his IV, this tech applied pressure and taped gauze on the site. Patient stated he needed to leave, this tech encouraged patient to stay to be seen and told patient he had a room ready patient refused to stay and left.

## 2021-07-04 ENCOUNTER — Emergency Department (HOSPITAL_COMMUNITY)
Admission: EM | Admit: 2021-07-04 | Discharge: 2021-07-04 | Disposition: A | Discharge status: Home / Self Care | Payer: Self-pay | Attending: Emergency Medicine | Admitting: Emergency Medicine

## 2021-07-04 ENCOUNTER — Other Ambulatory Visit: Payer: Self-pay

## 2021-07-04 ENCOUNTER — Encounter (HOSPITAL_COMMUNITY): Payer: Self-pay

## 2021-07-04 DIAGNOSIS — Z5321 Procedure and treatment not carried out due to patient leaving prior to being seen by health care provider: Secondary | ICD-10-CM | POA: Insufficient documentation

## 2021-07-04 DIAGNOSIS — M7989 Other specified soft tissue disorders: Secondary | ICD-10-CM | POA: Insufficient documentation

## 2021-07-04 MED ORDER — CHLORDIAZEPOXIDE HCL 25 MG PO CAPS: ORAL_CAPSULE | ORAL | 0 refills | Status: DC

## 2021-07-04 NOTE — ED Triage Notes (Signed)
Pt reports left hand swelling x2 days and blister on right foot.

## 2021-07-04 NOTE — ED Notes (Signed)
Pt stated that he was leaving.  

## 2021-07-04 NOTE — Discharge Instructions (Addendum)
You were seen in the ED today for alcohol problems.  We sent in a librium taper to help with withdrawal.  Take this as prescribed- do not take this medication and drink alcohol simultaneously as it can be dangerous.   We have prescribed you new medication(s) today. Discuss the medications prescribed today with your pharmacist as they can have adverse effects and interactions with your other medicines including over the counter and prescribed medications. Seek medical evaluation if you start to experience new or abnormal symptoms after taking one of these medicines, seek care immediately if you start to experience difficulty breathing, feeling of your throat closing, facial swelling, or rash as these could be indications of a more serious allergic reaction  Please follow up with attached resources as soon as possible.  Return to the ER for new or worsening symptoms or any other concerns.

## 2021-07-07 ENCOUNTER — Encounter (HOSPITAL_COMMUNITY): Payer: Self-pay | Admitting: Emergency Medicine

## 2021-07-07 ENCOUNTER — Other Ambulatory Visit: Payer: Self-pay

## 2021-07-07 ENCOUNTER — Emergency Department (HOSPITAL_COMMUNITY)
Admission: EM | Admit: 2021-07-07 | Discharge: 2021-07-08 | Disposition: A | Discharge status: Home / Self Care | Payer: Self-pay | Attending: Emergency Medicine | Admitting: Emergency Medicine

## 2021-07-07 DIAGNOSIS — F10129 Alcohol abuse with intoxication, unspecified: Secondary | ICD-10-CM | POA: Insufficient documentation

## 2021-07-07 DIAGNOSIS — Z21 Asymptomatic human immunodeficiency virus [HIV] infection status: Secondary | ICD-10-CM | POA: Insufficient documentation

## 2021-07-07 DIAGNOSIS — R251 Tremor, unspecified: Secondary | ICD-10-CM | POA: Insufficient documentation

## 2021-07-07 DIAGNOSIS — F1010 Alcohol abuse, uncomplicated: Secondary | ICD-10-CM

## 2021-07-07 DIAGNOSIS — F17210 Nicotine dependence, cigarettes, uncomplicated: Secondary | ICD-10-CM | POA: Insufficient documentation

## 2021-07-07 DIAGNOSIS — Y908 Blood alcohol level of 240 mg/100 ml or more: Secondary | ICD-10-CM | POA: Insufficient documentation

## 2021-07-07 LAB — CBC WITH DIFFERENTIAL/PLATELET
Abs Immature Granulocytes: 0.01 10*3/uL (ref 0.00–0.07)
Basophils Absolute: 0 10*3/uL (ref 0.0–0.1)
Basophils Relative: 1 %
Eosinophils Absolute: 0.1 10*3/uL (ref 0.0–0.5)
Eosinophils Relative: 2 %
HCT: 34.4 % — ABNORMAL LOW (ref 39.0–52.0)
Hemoglobin: 12 g/dL — ABNORMAL LOW (ref 13.0–17.0)
Immature Granulocytes: 0 %
Lymphocytes Relative: 44 %
Lymphs Abs: 1.9 10*3/uL (ref 0.7–4.0)
MCH: 33.5 pg (ref 26.0–34.0)
MCHC: 34.9 g/dL (ref 30.0–36.0)
MCV: 96.1 fL (ref 80.0–100.0)
Monocytes Absolute: 0.4 10*3/uL (ref 0.1–1.0)
Monocytes Relative: 9 %
Neutro Abs: 1.9 10*3/uL (ref 1.7–7.7)
Neutrophils Relative %: 44 %
Platelets: 134 10*3/uL — ABNORMAL LOW (ref 150–400)
RBC: 3.58 MIL/uL — ABNORMAL LOW (ref 4.22–5.81)
RDW: 14.6 % (ref 11.5–15.5)
WBC: 4.3 10*3/uL (ref 4.0–10.5)
nRBC: 0 % (ref 0.0–0.2)

## 2021-07-07 LAB — URINALYSIS, ROUTINE W REFLEX MICROSCOPIC
Bilirubin Urine: NEGATIVE
Glucose, UA: NEGATIVE mg/dL
Ketones, ur: NEGATIVE mg/dL
Leukocytes,Ua: NEGATIVE
Nitrite: NEGATIVE
Protein, ur: 30 mg/dL — AB
Specific Gravity, Urine: 1.021 (ref 1.005–1.030)
pH: 6 (ref 5.0–8.0)

## 2021-07-07 LAB — RAPID URINE DRUG SCREEN, HOSP PERFORMED
Amphetamines: POSITIVE — AB
Barbiturates: NOT DETECTED
Benzodiazepines: NOT DETECTED
Cocaine: NOT DETECTED
Opiates: NOT DETECTED
Tetrahydrocannabinol: NOT DETECTED

## 2021-07-07 LAB — COMPREHENSIVE METABOLIC PANEL
ALT: 48 U/L — ABNORMAL HIGH (ref 0–44)
AST: 69 U/L — ABNORMAL HIGH (ref 15–41)
Albumin: 3.5 g/dL (ref 3.5–5.0)
Alkaline Phosphatase: 68 U/L (ref 38–126)
Anion gap: 12 (ref 5–15)
BUN: <5 mg/dL — ABNORMAL LOW (ref 6–20)
CO2: 24 mmol/L (ref 22–32)
Calcium: 8.8 mg/dL — ABNORMAL LOW (ref 8.9–10.3)
Chloride: 102 mmol/L (ref 98–111)
Creatinine, Ser: 0.63 mg/dL (ref 0.61–1.24)
GFR, Estimated: >60 mL/min (ref >60)
Glucose, Bld: 108 mg/dL — ABNORMAL HIGH (ref 70–99)
Potassium: 3.5 mmol/L (ref 3.5–5.1)
Sodium: 138 mmol/L (ref 135–145)
Total Bilirubin: 0.7 mg/dL (ref 0.3–1.2)
Total Protein: 7 g/dL (ref 6.5–8.1)

## 2021-07-07 LAB — LIPASE, BLOOD: Lipase: 54 U/L — ABNORMAL HIGH (ref 11–51)

## 2021-07-07 LAB — ETHANOL: Alcohol, Ethyl (B): 352 mg/dL (ref <10)

## 2021-07-07 NOTE — ED Triage Notes (Signed)
Patient arrived with EMS from street requesting detox for his alcoholism , last drink today 3 bottles of wine .

## 2021-07-07 NOTE — ED Provider Notes (Signed)
Emergency Medicine Provider Triage Evaluation Note  Gary [name] , a 35 y.o. male  was evaluated in triage.  Pt hoping for etoh detox. 3 drinks today, last one hour ago. Usually drinks 7. Hx of alcoholic seizures. Anticipates another seizure.   Review of Systems  Positive: Palpitations, nausea, abd pain Negative: Fever, chills  Physical Exam  BP (!) 124/91   Pulse (!) 111   Temp 98 F (36.7 C)   Resp 16   Ht 6\' 1"  (1.854 m)   Wt 80 kg   SpO2 97%   BMI 23.27 kg/m  Gen:   Awake, no distress Resp:  Normal effort, CTA Cardi:  Tachy, regular rhythm MSK:   Moves extremities without difficulty  Other:    Medical Decision Making  Medically screening exam initiated at 9:46 PM.  Appropriate orders placed.  Gary [name] was informed that the remainder of the evaluation will be completed by another provider, this initial triage assessment does not replace that evaluation, and the importance of remaining in the ED until their evaluation is complete.  Withdrawals ETOH   [name], PA-C 07/07/21 2201    2202, MD 07/08/21 [phone]

## 2021-07-08 MED ORDER — THIAMINE HCL 100 MG PO TABS: 100.0000 mg | ORAL_TABLET | Freq: Every day | ORAL | Status: DC

## 2021-07-08 MED ORDER — LORAZEPAM 1 MG PO TABS
2.0000 mg | ORAL_TABLET | Freq: Once | ORAL | Status: AC
  Administered 2021-07-08: 2 mg via ORAL
  Filled 2021-07-08: qty 2

## 2021-07-08 MED ORDER — LORAZEPAM 1 MG PO TABS: 0.0000 mg | ORAL_TABLET | Freq: Two times a day (BID) | ORAL | Status: DC

## 2021-07-08 MED ORDER — LORAZEPAM 2 MG/ML IJ SOLN: 0.0000 mg | Freq: Four times a day (QID) | INTRAMUSCULAR | Status: DC

## 2021-07-08 MED ORDER — LORAZEPAM 1 MG PO TABS
0.0000 mg | ORAL_TABLET | Freq: Four times a day (QID) | ORAL | Status: DC
Start: 2021-07-08 — End: 2021-07-08

## 2021-07-08 MED ORDER — THIAMINE HCL 100 MG/ML IJ SOLN: 100.0000 mg | Freq: Every day | INTRAMUSCULAR | Status: DC

## 2021-07-08 MED ORDER — LORAZEPAM 2 MG/ML IJ SOLN: 0.0000 mg | Freq: Two times a day (BID) | INTRAMUSCULAR | Status: DC

## 2021-07-08 NOTE — Discharge Instructions (Addendum)
You were seen today for ongoing alcohol problems.  You were intoxicated upon arrival.  At this time you do not have any active signs of withdrawal.  You were seen less than a week ago and given outpatient resources and Librium taper.  You were also provided with a PEER consultation.  It is important that if you wish to seek treatment, you need to follow-up with outpatient resources provided.

## 2021-07-08 NOTE — ED Provider Notes (Signed)
[facility] EMERGENCY DEPARTMENT Provider Note   CSN: [id number] Arrival date & time: 07/07/21  2128     History Chief Complaint  Patient presents with   Alcoholism     Gary [name] is a 35 y.o. male.  HPI     This is a 35 year old male with history of alcohol abuse, HIV, pancreatitis, pseudocyst who presents with request for alcohol detox.  Patient is very well-known to our emergency department and has had approximately 35 visits in the last 6 months.  Many of these are related to alcohol abuse and suicidal ideation.  Patient reports he last drank yesterday.  He drank 3 bottles of wine.  It is normal for him to drink 3-6 bottles of wine per day.  He does report history of alcohol withdrawal.  When asked what made him present today, he states that "a friend told me I needed to get help."  He denies active suicidal or homicidal ideation.  He reports ongoing abdominal pain which he attributes to his chronic pancreatitis.  No nausea or vomiting.  Reports slight tremor.  Of note, patient was seen and evaluated on 8/20, 8/24, 8/25.  He was provided with multiple outpatient resources, peer evaluation, and a Librium taper.  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   Malnutrition of moderate degree 05/07/2020   Gastrointestinal hemorrhage    Alcoholic hepatitis without ascites    Melena 05/05/2020   Alcoholic ketoacidosis 05/05/2020   Thrombocytopenia (HCC) 07/25/2019   Transaminitis 07/25/2019   Traumatic  subdural hematoma (HCC) 07/02/2019   Alcohol abuse with intoxication (HCC) 07/02/2019   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    Past Surgical History:  Procedure Laterality Date   INCISION AND DRAINAGE PERIRECTAL ABSCESS N/A 09/24/2016   Procedure: IRRIGATION AND DEBRIDEMENT PERIRECTAL ABSCESS;  Surgeon: Ricarda Frame, MD;  Location: [facility] ORS;  Service: General;  Laterality: N/A;   none         Family History  Problem Relation Age of Onset   Alcohol abuse Mother    Alcohol abuse Father     Social History   Tobacco Use   Smoking status: Every Day    Packs/day: 0.50    Types: Cigarettes   Smokeless tobacco: Never   Tobacco comments:    unable to smoke while incarcerated 6+ months 02/13/20  Vaping Use   Vaping Use: Never used  Substance Use Topics   Alcohol use: Yes    Comment: "I drink all I can"   Drug use: Not Currently    Types: Marijuana, Methamphetamines    Comment: last used 04/10/2019    Home Medications Prior to Admission medications   Medication Sig Start Date End Date Taking? Authorizing Provider  bictegravir-emtricitabine-tenofovir AF (BIKTARVY) 50-200-25 MG TABS tablet Take 1 tablet  by mouth daily. 05/13/21   Kuppelweiser, Cassie L, RPH-CPP  chlordiazePOXIDE (LIBRIUM) 25 MG capsule 50mg  PO TID x 1D, then 25-50mg  PO BID X 1D, then 25-50mg  PO QD X 1D 07/04/21   Petrucelli, Samantha R, PA-C  ondansetron (ZOFRAN ODT) 4 MG disintegrating tablet Take 1 tablet (4 mg total) by mouth every 8 (eight) hours as needed for nausea or vomiting. 06/29/21   Long, 07/01/21, MD  famotidine (PEPCID) 20 MG tablet Take 1 tablet (20 mg total) by mouth 2 (two) times daily. Patient not taking: Reported on 06/01/2019 05/14/19 06/02/19   06/04/19, MD    Allergies    Tegretol [carbamazepine] and Caffeine  Review of Systems   Review of Systems  Constitutional:  Negative for fever.  Respiratory:  Negative for shortness of breath.   Cardiovascular:  Negative for chest pain.  Gastrointestinal:  Positive for abdominal pain. Negative for nausea and vomiting.  Genitourinary:  Negative for dysuria.  All other systems reviewed and are negative.  Physical Exam Updated Vital Signs BP 115/78 (BP Location: Left Arm)   Pulse 78   Temp 98 F (36.7 C)   Resp 16   Ht 1.854 m (6\' 1" )   Wt 80 kg   SpO2 98%   BMI 23.27 kg/m   Physical Exam Vitals and nursing note reviewed.  Constitutional:      Appearance: He is well-developed.     Comments: Chronically ill-appearing, disheveled, no acute distress  HENT:     Head: Normocephalic and atraumatic.  Eyes:     Pupils: Pupils are equal, round, and reactive to light.     Comments: Bilateral injected conjunctiva  Cardiovascular:     Rate and Rhythm: Normal rate and regular rhythm.     Heart sounds: Normal heart sounds. No murmur heard. Pulmonary:     Effort: Pulmonary effort is normal. No respiratory distress.     Breath sounds: Normal breath sounds. No wheezing.  Abdominal:     General: Bowel sounds are normal.     Palpations: Abdomen is soft.     Tenderness: There is no abdominal tenderness. There is no rebound.  Musculoskeletal:     Cervical back: Neck supple.     Comments: Slight tremor noted bilateral upper extremities  Lymphadenopathy:     Cervical: No cervical adenopathy.  Skin:    General: Skin is warm and dry.  Neurological:     General: No focal deficit present.     Mental Status: He is alert and oriented to person, place, and time.  Psychiatric:     Comments: Appears intoxicated    ED Results / Procedures / Treatments   Labs (all labs ordered are listed, but only abnormal results are displayed) Labs Reviewed  ETHANOL - Abnormal; Notable for the  following components:      Result Value   Alcohol, Ethyl (B) 352 (*)    All other components within normal limits  COMPREHENSIVE METABOLIC PANEL - Abnormal; Notable for the following components:   Glucose, Bld 108 (*)    BUN <5 (*)    Calcium 8.8 (*)    AST 69 (*)    ALT 48 (*)    All other components within normal limits  LIPASE, BLOOD - Abnormal; Notable for the following components:   Lipase 54 (*)    All other components within normal limits  CBC WITH DIFFERENTIAL/PLATELET - Abnormal; Notable for the following components:   RBC 3.58 (*)    Hemoglobin 12.0 (*)  HCT 34.4 (*)    Platelets 134 (*)    All other components within normal limits  RAPID URINE DRUG SCREEN, HOSP PERFORMED - Abnormal; Notable for the following components:   Amphetamines POSITIVE (*)    All other components within normal limits  URINALYSIS, ROUTINE W REFLEX MICROSCOPIC - Abnormal; Notable for the following components:   Color, Urine AMBER (*)    APPearance HAZY (*)    Hgb urine dipstick SMALL (*)    Protein, ur 30 (*)    Bacteria, UA RARE (*)    All other components within normal limits    EKG None  Radiology No results found.  Procedures Procedures   Medications Ordered in ED Medications  LORazepam (ATIVAN) tablet 2 mg (has no administration in time range)    ED Course  I have reviewed the triage vital signs and the nursing notes.  Pertinent labs & imaging results that were available during my care of the patient were reviewed by me and considered in my medical decision making (see chart for details).    MDM Rules/Calculators/A&P                           Patient presents requesting detox.  He is not homicidal or suicidal.  He appears acutely intoxicated and initial alcohol level mid 300s.  Labs were obtained by [facility] provider.  LFTs are baseline.  Lipase is 54 which is also close to the patient's baseline.  Do not see any signs of acute withdrawal.  He does have a slight tremor but  this has been noted in the past.  He does not meet criteria for inpatient admission.  He was recently given outpatient referral and resources.  I have encouraged him to call for follow-up.  He was given 2 mg of Ativan prior to discharge for his tremor and to address any mild withdrawal symptoms.  After history, exam, and medical workup I feel the patient has been appropriately medically screened and is safe for discharge home. Pertinent diagnoses were discussed with the patient. Patient was given return precautions.  Final Clinical Impression(s) / ED Diagnoses Final diagnoses:  Alcohol abuse    Rx / DC Orders ED Discharge Orders     None        Lacey Dotson, Mayer Masker, MD 07/08/21 [phone]

## 2021-07-11 ENCOUNTER — Emergency Department (HOSPITAL_COMMUNITY): Payer: Self-pay

## 2021-07-11 ENCOUNTER — Emergency Department (HOSPITAL_COMMUNITY)
Admission: EM | Admit: 2021-07-11 | Discharge: 2021-07-13 | Disposition: A | Discharge status: Home / Self Care | Payer: Self-pay | Attending: Emergency Medicine | Admitting: Emergency Medicine

## 2021-07-11 ENCOUNTER — Encounter (HOSPITAL_COMMUNITY): Payer: Self-pay

## 2021-07-11 DIAGNOSIS — F332 Major depressive disorder, recurrent severe without psychotic features: Secondary | ICD-10-CM | POA: Insufficient documentation

## 2021-07-11 DIAGNOSIS — F1994 Other psychoactive substance use, unspecified with psychoactive substance-induced mood disorder: Secondary | ICD-10-CM | POA: Diagnosis present

## 2021-07-11 DIAGNOSIS — Y908 Blood alcohol level of 240 mg/100 ml or more: Secondary | ICD-10-CM | POA: Insufficient documentation

## 2021-07-11 DIAGNOSIS — Z20822 Contact with and (suspected) exposure to covid-19: Secondary | ICD-10-CM | POA: Insufficient documentation

## 2021-07-11 DIAGNOSIS — F1910 Other psychoactive substance abuse, uncomplicated: Secondary | ICD-10-CM | POA: Diagnosis present

## 2021-07-11 DIAGNOSIS — R109 Unspecified abdominal pain: Secondary | ICD-10-CM

## 2021-07-11 DIAGNOSIS — R45851 Suicidal ideations: Secondary | ICD-10-CM

## 2021-07-11 DIAGNOSIS — Z21 Asymptomatic human immunodeficiency virus [HIV] infection status: Secondary | ICD-10-CM | POA: Insufficient documentation

## 2021-07-11 DIAGNOSIS — F17210 Nicotine dependence, cigarettes, uncomplicated: Secondary | ICD-10-CM | POA: Insufficient documentation

## 2021-07-11 DIAGNOSIS — F1020 Alcohol dependence, uncomplicated: Secondary | ICD-10-CM | POA: Diagnosis present

## 2021-07-11 DIAGNOSIS — F10220 Alcohol dependence with intoxication, uncomplicated: Secondary | ICD-10-CM

## 2021-07-11 DIAGNOSIS — T148XXA Other injury of unspecified body region, initial encounter: Secondary | ICD-10-CM

## 2021-07-11 DIAGNOSIS — F1094 Alcohol use, unspecified with alcohol-induced mood disorder: Secondary | ICD-10-CM | POA: Diagnosis present

## 2021-07-11 DIAGNOSIS — S301XXA Contusion of abdominal wall, initial encounter: Secondary | ICD-10-CM | POA: Insufficient documentation

## 2021-07-11 DIAGNOSIS — F1024 Alcohol dependence with alcohol-induced mood disorder: Secondary | ICD-10-CM | POA: Insufficient documentation

## 2021-07-11 DIAGNOSIS — X58XXXA Exposure to other specified factors, initial encounter: Secondary | ICD-10-CM | POA: Insufficient documentation

## 2021-07-11 LAB — CBC WITH DIFFERENTIAL/PLATELET
Abs Immature Granulocytes: 0.01 10*3/uL (ref 0.00–0.07)
Basophils Absolute: 0.1 10*3/uL (ref 0.0–0.1)
Basophils Relative: 1 %
Eosinophils Absolute: 0.1 10*3/uL (ref 0.0–0.5)
Eosinophils Relative: 3 %
HCT: 33.9 % — ABNORMAL LOW (ref 39.0–52.0)
Hemoglobin: 11.5 g/dL — ABNORMAL LOW (ref 13.0–17.0)
Immature Granulocytes: 0 %
Lymphocytes Relative: 27 %
Lymphs Abs: 1.1 10*3/uL (ref 0.7–4.0)
MCH: 33.5 pg (ref 26.0–34.0)
MCHC: 33.9 g/dL (ref 30.0–36.0)
MCV: 98.8 fL (ref 80.0–100.0)
Monocytes Absolute: 0.4 10*3/uL (ref 0.1–1.0)
Monocytes Relative: 9 %
Neutro Abs: 2.5 10*3/uL (ref 1.7–7.7)
Neutrophils Relative %: 60 %
Platelets: 148 10*3/uL — ABNORMAL LOW (ref 150–400)
RBC: 3.43 MIL/uL — ABNORMAL LOW (ref 4.22–5.81)
RDW: 16.4 % — ABNORMAL HIGH (ref 11.5–15.5)
WBC: 4.2 10*3/uL (ref 4.0–10.5)
nRBC: 0 % (ref 0.0–0.2)

## 2021-07-11 LAB — COMPREHENSIVE METABOLIC PANEL
ALT: 42 U/L (ref 0–44)
AST: 58 U/L — ABNORMAL HIGH (ref 15–41)
Albumin: 3.5 g/dL (ref 3.5–5.0)
Alkaline Phosphatase: 85 U/L (ref 38–126)
Anion gap: 12 (ref 5–15)
BUN: <5 mg/dL — ABNORMAL LOW (ref 6–20)
CO2: 24 mmol/L (ref 22–32)
Calcium: 8.3 mg/dL — ABNORMAL LOW (ref 8.9–10.3)
Chloride: 103 mmol/L (ref 98–111)
Creatinine, Ser: 0.68 mg/dL (ref 0.61–1.24)
GFR, Estimated: >60 mL/min (ref >60)
Glucose, Bld: 99 mg/dL (ref 70–99)
Potassium: 3.8 mmol/L (ref 3.5–5.1)
Sodium: 139 mmol/L (ref 135–145)
Total Bilirubin: 0.9 mg/dL (ref 0.3–1.2)
Total Protein: 6.8 g/dL (ref 6.5–8.1)

## 2021-07-11 LAB — RESP PANEL BY RT-PCR (FLU A&B, COVID) ARPGX2
Influenza A by PCR: NEGATIVE
Influenza B by PCR: NEGATIVE
SARS Coronavirus 2 by RT PCR: NEGATIVE

## 2021-07-11 LAB — ETHANOL: Alcohol, Ethyl (B): 388 mg/dL (ref <10)

## 2021-07-11 IMAGING — CT CT ABD-PELV W/ CM
2 of 5 series · 16 of 46 positions shown, 18 images · IV contrast (omnipaque)
Comparison: [DATE]

CLINICAL DATA: Trauma

EXAM:
CT ABDOMEN AND PELVIS WITH CONTRAST
TECHNIQUE: Multidetector CT imaging of the abdomen and pelvis was performed
using the standard protocol following bolus administration of
intravenous contrast.
CONTRAST:  100mL OMNIPAQUE IOHEXOL 350 MG/ML SOLN

[Series 3: a/p w/ 5mm · axial · 0.88mm/px · z∈[-945,-495]mm · 13 of 102 slices shown, 15 images]
[im 6/102  soft-tissue]
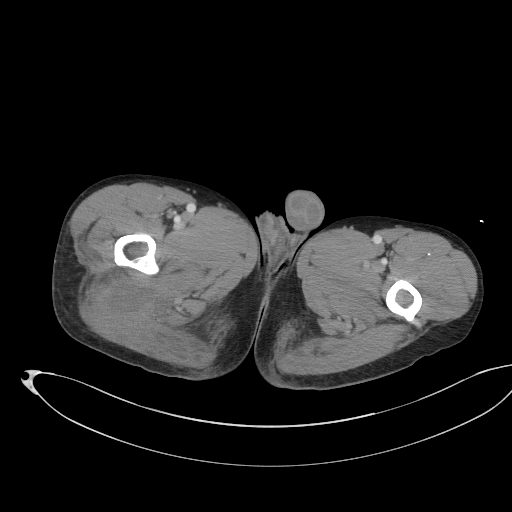
[im 6/102  bone]
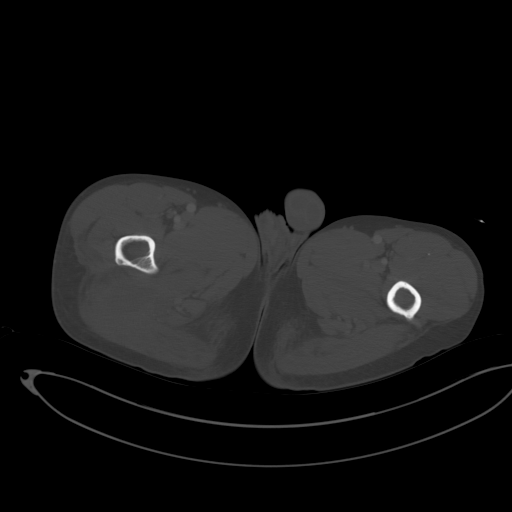
[im 12/102  soft-tissue]
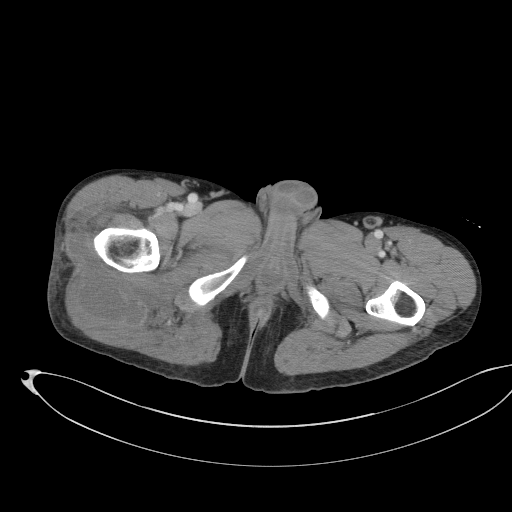
[im 23/102  soft-tissue]
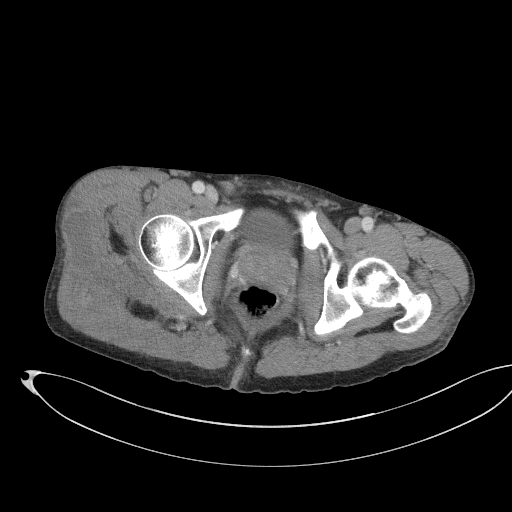
[im 29/102  soft-tissue]
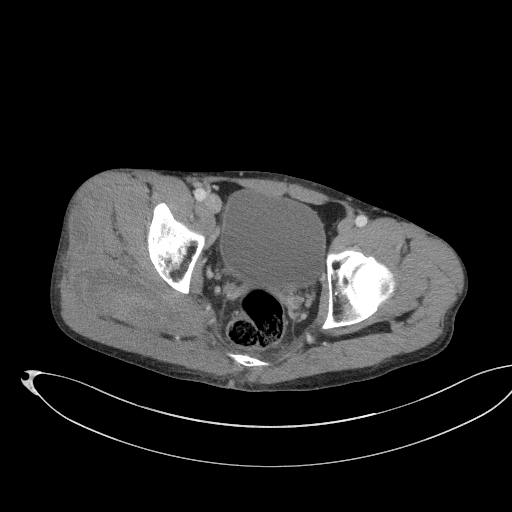
[im 34/102  soft-tissue]
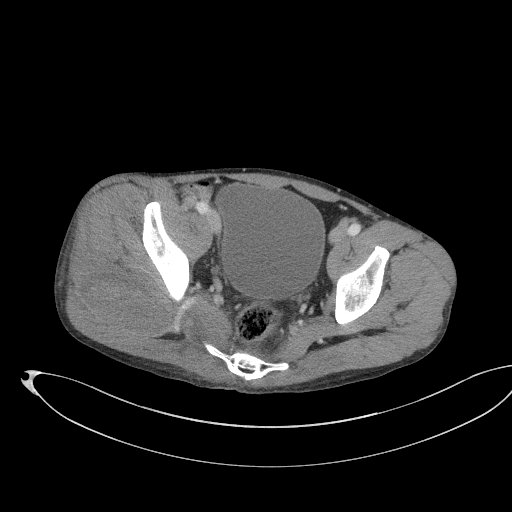
[im 45/102  soft-tissue]
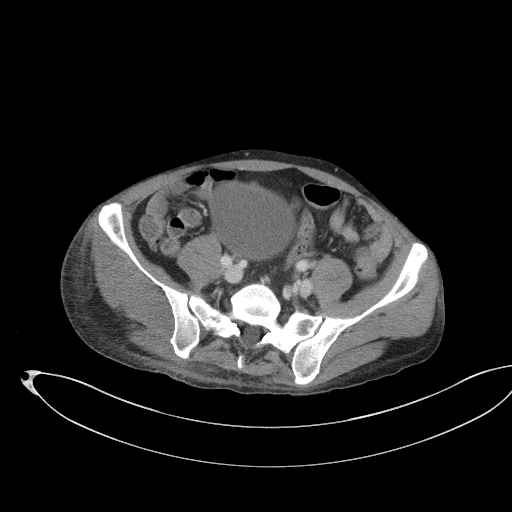
[im 51/102  soft-tissue]
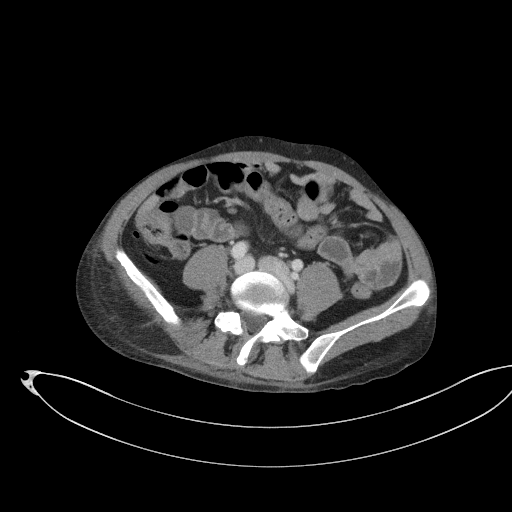
[im 57/102  soft-tissue]
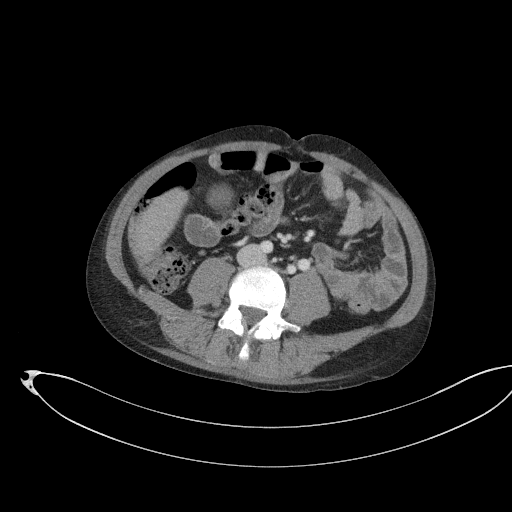
[im 68/102  soft-tissue]
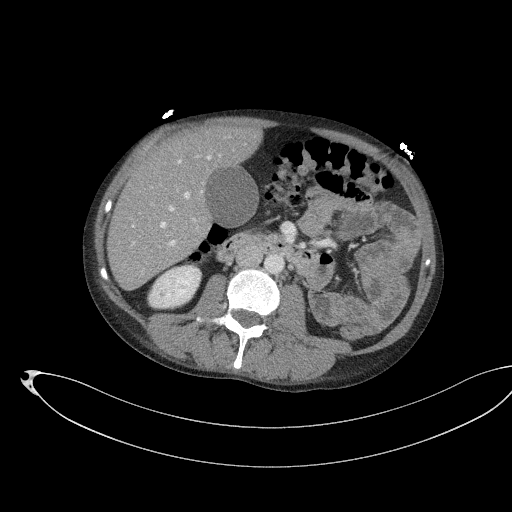
[im 68/102  bone]
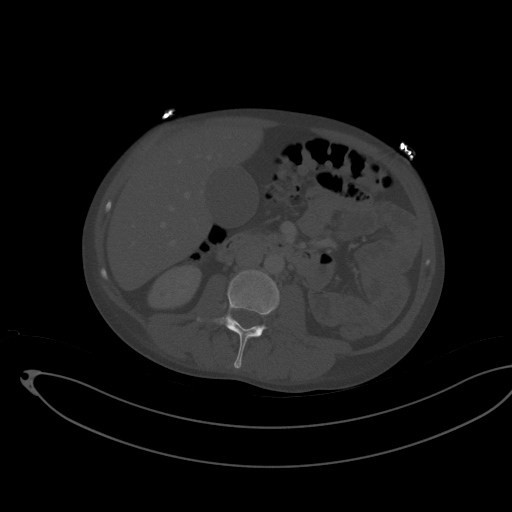
[im 73/102  soft-tissue]
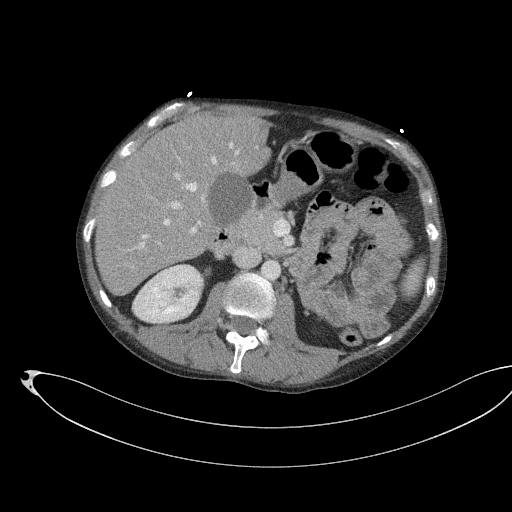
[im 79/102  soft-tissue]
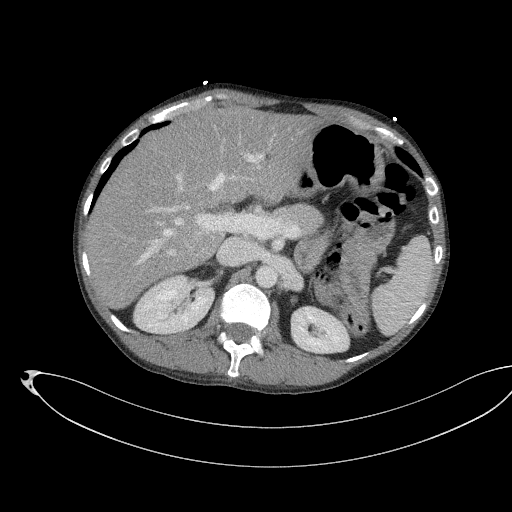
[im 90/102  soft-tissue]
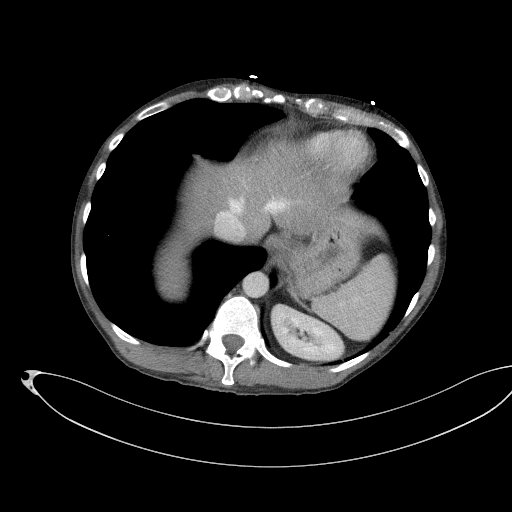
[im 96/102  soft-tissue]
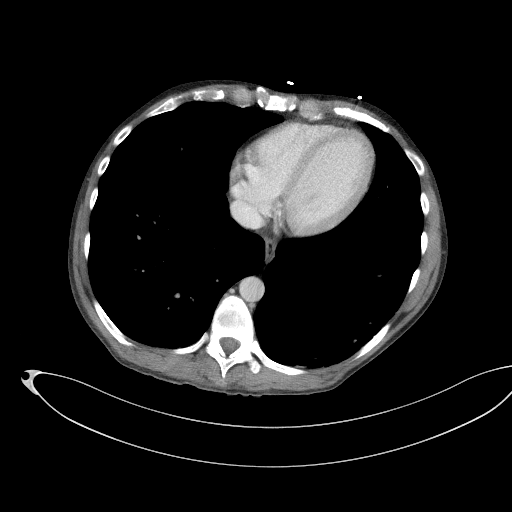

[Series 7: a/p w/ cor · coronal · 0.84mm/px · 3 of 137 slices shown]
[im 46/137  soft-tissue]
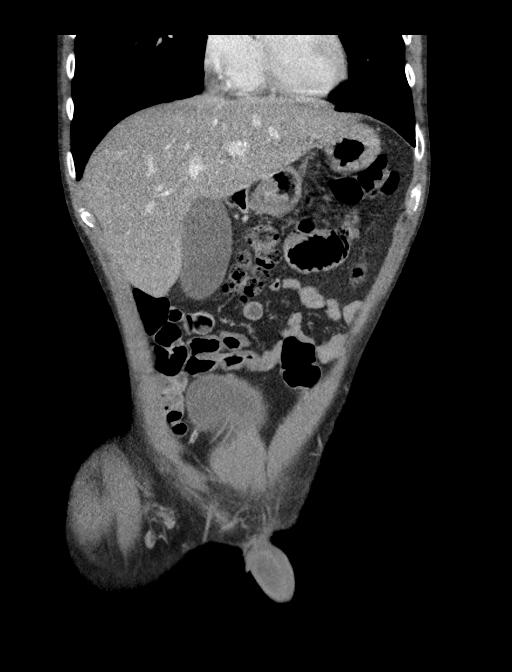
[im 61/137  soft-tissue]
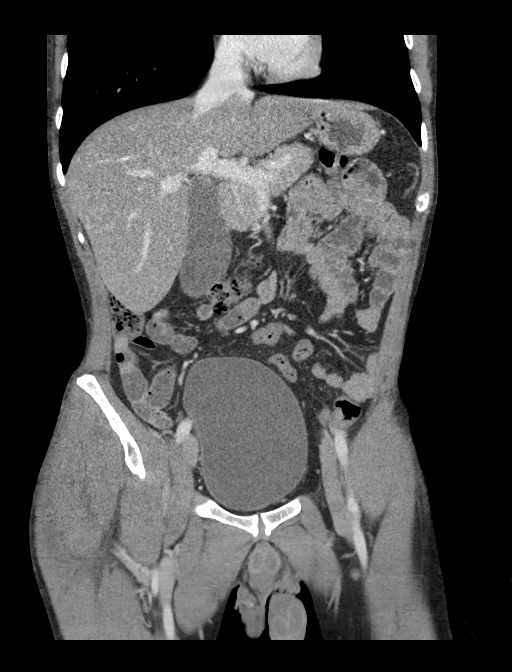
[im 76/137  soft-tissue]
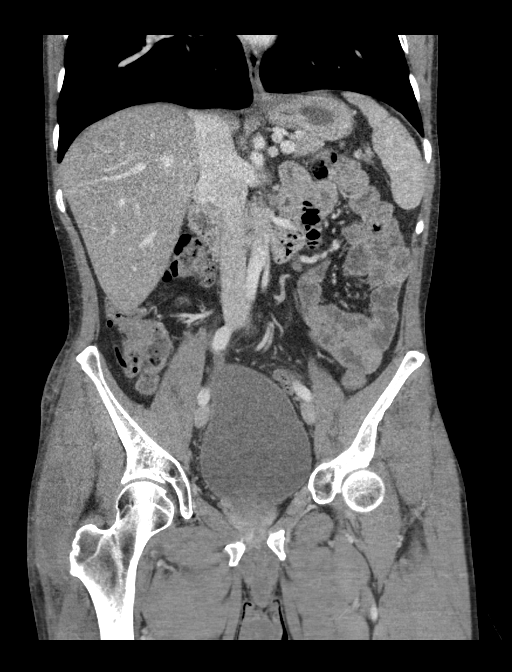

[16 of 46 positions shown; findings below may reference images not displayed]

FINDINGS: Lower chest: Lung bases are clear.

Hepatobiliary: Liver is within normal limits. No perihepatic
fluid/hemorrhage.

Gallbladder is unremarkable. No intrahepatic or extrahepatic ductal
dilatation.

Pancreas: Within normal limits.

Spleen: Within normal limits.  No perisplenic fluid/hemorrhage.

Adrenals/Urinary Tract: Adrenal glands are within normal limits.

7 mm nonobstructing right lower pole renal cyst. Left kidney is
within normal limits. No hydronephrosis.

Bladder is within normal limits.

Stomach/Bowel: Stomach is within normal limits.

No evidence of bowel obstruction.

Normal appendix (series 3/image 54).

No colonic wall thickening or inflammatory changes.

Vascular/Lymphatic: No evidence of abdominal aortic aneurysm.

No suspicious abdominopelvic lymphadenopathy.

Reproductive: Prostate is unremarkable.

Other: No abdominopelvic ascites.

Musculoskeletal: Expansile hematoma involving the right gluteus
medius muscle (series 3/image 72; coronal image 98) extending into
the right piriformis (series 3/image 71). This is difficult to
discretely measure but representative axial measurements are 4.2 x
11.6 cm with a craniocaudal dimension of 20.9 cm.

Visualized osseous structures are within normal limits. No fracture
is seen.
IMPRESSION: Expansile intramuscular hematoma involving the right gluteus medius
muscle and extending into the right piriformis, as above.

Otherwise, no evidence of traumatic injury to the abdomen/pelvis. No
fracture is seen.

## 2021-07-11 MED ORDER — THIAMINE HCL 100 MG PO TABS
100.0000 mg | ORAL_TABLET | Freq: Every day | ORAL | Status: DC
  Administered 2021-07-12 – 2021-07-13 (×2): 100 mg via ORAL
  Filled 2021-07-11 (×2): qty 1

## 2021-07-11 MED ORDER — LORAZEPAM 1 MG PO TABS
0.0000 mg | ORAL_TABLET | Freq: Four times a day (QID) | ORAL | Status: AC
  Administered 2021-07-12 – 2021-07-13 (×3): 2 mg via ORAL
  Filled 2021-07-11 (×2): qty 4
  Filled 2021-07-11: qty 2

## 2021-07-11 MED ORDER — IOHEXOL 350 MG/ML SOLN
100.0000 mL | Freq: Once | INTRAVENOUS | Status: AC | PRN
  Administered 2021-07-11: 100 mL via INTRATHECAL

## 2021-07-11 MED ORDER — LORAZEPAM 2 MG/ML IJ SOLN
0.0000 mg | Freq: Four times a day (QID) | INTRAMUSCULAR | Status: AC
  Administered 2021-07-11: 1 mg via INTRAVENOUS
  Administered 2021-07-11: 2 mg via INTRAVENOUS
  Administered 2021-07-12: 1 mg via INTRAVENOUS
  Filled 2021-07-11 (×3): qty 1

## 2021-07-11 MED ORDER — SODIUM CHLORIDE 0.9 % IV BOLUS
1000.0000 mL | Freq: Once | INTRAVENOUS | Status: AC
  Administered 2021-07-11: 1000 mL via INTRAVENOUS

## 2021-07-11 MED ORDER — THIAMINE HCL 100 MG/ML IJ SOLN
100.0000 mg | Freq: Every day | INTRAMUSCULAR | Status: DC
  Administered 2021-07-11: 100 mg via INTRAVENOUS
  Filled 2021-07-11: qty 2

## 2021-07-11 MED ORDER — LORAZEPAM 1 MG PO TABS: 0.0000 mg | ORAL_TABLET | Freq: Two times a day (BID) | ORAL | Status: DC

## 2021-07-11 MED ORDER — LORAZEPAM 2 MG/ML IJ SOLN: 0.0000 mg | Freq: Two times a day (BID) | INTRAMUSCULAR | Status: DC

## 2021-07-11 NOTE — ED Notes (Signed)
Pt resting in bed. Given water. Denies further needs. NAD noted.
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
## 2021-07-11 NOTE — ED Triage Notes (Signed)
Pt bib ems for panic attacks and ETOH abuse. When asked if he is suicidal he states " I will kill myself I dont get some help". Reports he has only had about 5 bottles of wine today.

## 2021-07-11 NOTE — ED Notes (Signed)
Patient's bag was taken and placed at nurses station, RN aware. Patient's bag was removed due to possesion of alcohol.
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
## 2021-07-11 NOTE — ED Notes (Signed)
Pt is now being TTS'd.
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
## 2021-07-12 ENCOUNTER — Encounter (HOSPITAL_COMMUNITY): Payer: Self-pay | Admitting: Registered Nurse

## 2021-07-12 DIAGNOSIS — F1094 Alcohol use, unspecified with alcohol-induced mood disorder: Secondary | ICD-10-CM

## 2021-07-12 DIAGNOSIS — F1020 Alcohol dependence, uncomplicated: Secondary | ICD-10-CM

## 2021-07-12 DIAGNOSIS — F332 Major depressive disorder, recurrent severe without psychotic features: Secondary | ICD-10-CM

## 2021-07-12 DIAGNOSIS — R45851 Suicidal ideations: Secondary | ICD-10-CM

## 2021-07-12 DIAGNOSIS — F1910 Other psychoactive substance abuse, uncomplicated: Secondary | ICD-10-CM

## 2021-07-12 MED ORDER — LORAZEPAM 1 MG PO TABS
2.0000 mg | ORAL_TABLET | Freq: Once | ORAL | Status: AC
  Administered 2021-07-12: 2 mg via ORAL
  Filled 2021-07-12: qty 2

## 2021-07-12 MED ORDER — ONDANSETRON HCL 4 MG/2ML IJ SOLN
4.0000 mg | Freq: Once | INTRAMUSCULAR | Status: AC
  Administered 2021-07-12: 4 mg via INTRAVENOUS
  Filled 2021-07-12: qty 2

## 2021-07-12 MED ORDER — DIAZEPAM 5 MG PO TABS
5.0000 mg | ORAL_TABLET | Freq: Once | ORAL | Status: AC
  Administered 2021-07-12: 5 mg via ORAL
  Filled 2021-07-12: qty 1

## 2021-07-12 NOTE — Discharge Instructions (Addendum)
Substance Abuse Resources   [facility] - Admissions are currently completed Monday through Friday at 8am; both appointments and walk-ins are accepted.  Any individual that is a [place] resident may present for a substance abuse screening and assessment for admission.  A person may be referred by numerous sources or self-refer.   Potential clients will be screened for medical necessity and appropriateness for the program.  Clients must meet criteria for high-intensity residential treatment services.  If clinically appropriate, a client will continue with the comprehensive clinical assessment and intake process, as well as enrollment in the [facility].  Address: [address] [place], [place] [address] Admin Hours: Mon-Fri 8AM to 5PM Center Hours: 24/7 Phone: [phone] Fax: [phone]  [facility] (Detox) Facility Based Crisis:  These are 3 locations for services: Please call before arrival:    [facility] (FBC)  Address: [address] [place], [place] [address] Phone: [phone]  [facility] ([facility]) Address: [address], [place], [place] [address] Phone#: [phone]  [facility] ([facility]) Address: [address], [place], [place] [address] Phone#: [phone]   [facility] ([facility]): (offers outpatient therapy and intensive outpatient substance abuse therapy).  [address], [place], [place] [address] Phone: [phone]  [facility] Address: [address] [place], [place] [address] Phone: [phone]  -[facility] provides food, shelter and other programs and services to the homeless men of [place] through our men's program.  By offering safe shelter, three meals a day, clean clothing, Biblical counseling, financial planning, vocational training, GED/education and  employment assistance, we've helped mend the shattered lives of many homeless men since opening in 1974.  We have approximately 267 beds available, with a max of 312 beds including mats for emergency situations and currently house an average of 270 men a night.  Prospective Client Check-In Information Photo ID Required (State/ Out of State/ DOC) - if photo ID is not available, clients are required to have a printout of a police/sheriff's criminal history report. Help out with chores around the Mission. No sex offender of any type (pending, charged, registered and/or any other sex related offenses) will be permitted to check in. Must be willing to abide by all rules, regulations, and policies established by the [facility]. The following will be provided - shelter, food, clothing, and biblical counseling. If you or someone you know is in need of assistance at our men's shelter in [place], please call [phone] ext. [phone].  [facility] intake hours are Monday-Friday only.   [facility]:   Phone: [phone]  The [facility] SA Intensive Outpatient Program (SAIOP) means structured individual and group addiction activities and services that are provided at an outpatient program designed to assist adult and adolescent consumers to begin recovery and learn skills for recovery maintenance. The [facility] SAIOP program is offered at least 3 hours a day, 3 days a week. SAIOP services shall include a structured program consisting of, but not limited to, the following services: Individual counseling and support; Group counseling and support; Family counseling, training or support; Biochemical assays to identify recent drug use (e.g., urine drug screens); Strategies for relapse prevention to include community and social support systems in treatment; Life skills; Crisis contingency planning; Disease Management; and Treatment  support activities that have been adapted or   specifically designed for persons with physical disabilities, or persons with co-occurring disorders of mental illness and substance abuse/dependence or mental retardation/developmental disability and substance abuse/dependence.  Phone: [phone]   [facility])  Address: [address] [place], [place], [place] [address] Phone: [phone]   [facility] Address: [address], [place], [place] [address] Phone: [phone]  - a combination of group and individual sessions to meet the participants needs. This allows participants to engage in treatment and remain involved in their home and work life. - Transitional housing places program participants in a supportive living environment while they complete a treatment program and work to secure independent housing. - The Substance Abuse Intensive Outpatient Treatment Program at [employer] consists of structured group sessions and individual sessions that are designed to teach participants early recovery and relapse prevention skills. -[facility] works with the [employer] to provide a housing and treatment program for homeless veterans.   [facility].   Address: [address] [place], [place] [address] Phone#: [phone]   : Referrals to RTSA facilities can be made by [facility] [employer] and [facility].  Referrals are also accepted from physicians, private providers, hospital emergency rooms, family members, or any person who has knowledge of someone in the need of our services.  [facility] will also offer the following outpatient services: (Monday through Friday 8am-5pm)   Partial Hospitalization Program (PHP) Substance Abuse Intensive Outpatient Program (SA-IOP) Group Therapy Medication Management Peer Living Room We also provide (24/7):  Assessments: Our mental health  clinician and providers will conduct a focused mental health evaluation, assessing for immediate safety concerns and further mental health needs. Referral: Our team will provide resources and help connect to community based mental health treatment, when indicated, including psychotherapy, psychiatry, and other specialized behavioral health or substance use disorder services (for those not already in treatment). Transitional Care: Our team providers in person bridging and/or telephonic follow-up during the patient's transition to outpatient services.   [facility] 24-Hour Call Center: [phone] Behavioral Health Crisis Line: [phone]                     Intensive Outpatient Programs  [facility]    [facility] [address]     [address] [place],  [place]     [place], [place] [phone]      [phone]  [name]   [facility]  (Inpatient and outpatient)  [phone] (Suboxone and Methadone) [address]           [phone]           [facility]: [facility]    [facility] - Intensive Outpatient [address] [place] [address]     [address] [place], [place] [address]     [place], [place]  [phone]      [phone]  [facility] (Outpatient, Inpatient, Chemical  [facility] (Groups and Residental) (insurance only) [phone]    [place], [place]          [phone]       [facility]    [facility] (for caregivers and family) [address]     [address] [place], [place]     [place], [place] [phone]      [phone]  Residential Treatment Programs  [facility] [place](2 years) Residential: 90 days)  [facility] ([facility]      [address] [place], [place]     [place], [place] [phone]      [phone] or  [phone]  [facility]    [facility] [address]      [address] [place], [place]     [place], [place] [phone]      [phone]  [facility] Residential Treatment Facility   [facility] ([facility]) [address]     [address] [place], [place] [address]     [place], [place] [phone]      [phone] Admissions: 8am-3pm M-F  [facility]: Residential Program [phone] Days)              [facility]: [facility]  [place], [place]     [place], [place]  [phone] or [phone]    (Walk in Hours over the weekend or by referral)   [facility]: [facility]:[phone] (for crisis response 24 hours a day)

## 2021-07-12 NOTE — ED Notes (Signed)
Pt ambulated to bathroom on his own power. Gait slow but steady

## 2021-07-12 NOTE — ED Notes (Signed)
Report given to Linda, RN.

## 2021-07-12 NOTE — Progress Notes (Signed)
Per Shuvon Rankin,NP, patient meets criteria for inpatient treatment. There are no available or appropriate beds at [facility] today. CSW faxed referrals to the following facilities for review:  [facility]  Pending - Request Sent N/A [address], [place] [address] [phone] --  [facility]  Pending - Request Sent N/A [address] [place] [address] [phone] --  [facility]  Pending - Request Sent N/A [address] [place], [place] [address] [phone] --  [facility]  Pending - Request Sent N/A [phone] [address] [place] [address] [phone] --  [facility]  Pending - Request Sent N/A [address] [place] [address] [phone] --  [facility]  Pending - Request Sent N/A [address] [place] [address] [phone] --  [facility]  Pending - Request Sent N/A [address] [place] [address] [phone] --  [facility]  Pending - Request Sent N/A [address], [place] [address] [phone] --  [facility]  Pending - Request Sent N/A [address] [place] [address] [phone] --  [facility]  Pending - Request Sent N/A [address] [place] [address] [phone] --  [facility]  Pending - Request Sent N/A [address] [place] [address] [place] [address] [phone] --  [facility]  Pending - No Request Sent N/A [address], [place] [address] [phone] --  [facility]  Pending - No Request Sent N/A [address] [place] [address] [phone] --  [facility]  Pending - No Request Sent  N/A [address] [place], [place] [address] [phone] --  [facility]  Pending - No Request Sent N/A [address] [place] [address] [phone] --  [facility]  Pending - No Request Sent N/A [address] [place] [address] [phone] --  [facility]  Pending - No Request Sent N/A [address], [place] [address] [phone] --   TTS will continue to seek bed placement.  Crissie Reese, MSW, LCSW-A, LCAS-A Phone: [phone] Disposition/TOC

## 2021-07-12 NOTE — ED Notes (Signed)
Pt feels like he needs to be reassessed to receive ativan more often. I messaged Dr. Judd Lien about this. I told him right now it's q 6 hours. Pt on 3rd cup of water and has been able to keep them down.

## 2021-07-12 NOTE — ED Notes (Addendum)
Pt returned from restroom and is now c/o CP, nausea, dizziness. VS WNL,  EKG obtained and provider Dr. Judd Lien made aware of same

## 2021-07-12 NOTE — Consult Note (Addendum)
Telepsych Consultation   Reason for Consult:  Alcohol withdrawal and suicidal ideation Referring Physician:  Linwood Dibbles, MD Location of Patient: [facility] ED Location of Provider: Other: Home Office  Patient Identification: Gary [name] MRN:  [id number] Principal Diagnosis: Alcohol-induced mood disorder (HCC) Diagnosis:  Principal Problem:   Alcohol-induced mood disorder (HCC) Active Problems:   Polysubstance abuse (HCC)   Alcohol use disorder, severe, dependence (HCC)   Major depressive disorder, recurrent severe without psychotic features (HCC)   Substance induced mood disorder (HCC)   Suicidal ideation   Total Time spent with patient: 30 minutes  Subjective:   Gary [name] is a 35 y.o. male patient admitted to [facility] ED after he presented requesting alcohol detox and complaints of depression and suicidal ideation.  HPI:  Gary [name], 35 y.o., male patient seen via tele health by this provider, consulted with Dr. Nelly Rout; and chart reviewed on 07/12/21.  On evaluation Gary [name] reports he was told by friends he needed to come to hospital to get help related to his depression and suicidal ideation.   Patient reporting he wants to stop drinking and get his life together.  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  Patient states he doesn't have any outpatient psychiatric services at this time.  Patient reports he has been to rehab only once and it was at "the beginning of this year."  States he was sober for months after rehab.  Patient stating that he is suicidal and unable to contract for safety stating that he will drink himself to death if he is unable to get help.  Patient currently reporting symptoms from withdrawal.  Patient reporting he is interested in detox and rehab.    During evaluation Gary [name]  is sitting up in bed in no acute distress.  He is alert, oriented x 4, calm and cooperative.  His mood is dysphoric, anxious with congruent affect.  He does not appear to be responding to internal/external stimuli or delusional thoughts.  Patient denies homicidal ideation, psychosis, and paranoia; patient continues to endorse suicidal ideation if he is unable to get help with alcohol detox and rehab.    Patient has a history of frequent ED visits requesting assistance with alcohol detox and rehab service but has also frequently refused referrals and has been noncompliant with follow up.  Patient offered [facility] detox/rehab today and stated he has been to [facility] before and doesn't feel it's truly a detox program and has no interest in it.     Past Psychiatric History: See above  Risk to Self:  Stating suicidal if unable to get help with alcohol detox/rehab.  Stating he will drink himself to death Risk to Others:  Denies Prior Inpatient Therapy:  yes Prior Outpatient Therapy:  Yes  Past Medical History:  Past Medical History:  Diagnosis Date   Alcohol abuse    HIV (human immunodeficiency virus infection) (HCC)    Seizures (HCC)    Subdural hematoma (HCC)     Past Surgical History:  Procedure Laterality Date   INCISION AND DRAINAGE PERIRECTAL ABSCESS N/A 09/24/2016   Procedure: IRRIGATION AND DEBRIDEMENT PERIRECTAL ABSCESS;  Surgeon: Ricarda Frame, MD;  Location: [facility] ORS;  Service: General;  Laterality: N/A;   none     Family History:  Family History  Problem Relation Age of  Onset   Alcohol abuse Mother    Alcohol abuse Father    Family Psychiatric  History: See above Social History:  Social History   Substance and Sexual Activity  Alcohol Use Yes   Comment: "I drink all I can"     Social History   Substance and Sexual Activity  Drug Use Not Currently   Types: Marijuana, Methamphetamines   Comment: last used 04/10/2019    Social History   Socioeconomic History    Marital status: Single    Spouse name: Not on file   Number of children: Not on file   Years of education: Not on file   Highest education level: Not on file  Occupational History   Occupation: unemployed  Tobacco Use   Smoking status: Every Day    Packs/day: 0.50    Types: Cigarettes   Smokeless tobacco: Never   Tobacco comments:    unable to smoke while incarcerated 6+ months 02/13/20  Vaping Use   Vaping Use: Never used  Substance and Sexual Activity   Alcohol use: Yes    Comment: "I drink all I can"   Drug use: Not Currently    Types: Marijuana, Methamphetamines    Comment: last used 04/10/2019   Sexual activity: Yes    Partners: Male, Male    Comment: declined condoms  03/2021  Other Topics Concern   Not on file  Social History Narrative   "Currently living on the streets"   Independent at baseline   Social Determinants of Health   Financial Resource Strain: Not on file  Food Insecurity: Not on file  Transportation Needs: Not on file  Physical Activity: Not on file  Stress: Not on file  Social Connections: Not on file   Additional Social History:    Allergies:   Allergies  Allergen Reactions   Tegretol [Carbamazepine] Other (See Comments)    Caused vertigo for 2 days after taking it   Caffeine Palpitations    Labs:  Results for orders placed or performed during the hospital encounter of 07/11/21 (from the past 48 hour(s))  Resp Panel by RT-PCR (Flu A&B, Covid) Nasopharyngeal Swab     Status: None   Collection Time: 07/11/21  1:21 PM   Specimen: Nasopharyngeal Swab; Nasopharyngeal(NP) swabs in vial transport medium  Result Value Ref Range   SARS Coronavirus 2 by RT PCR NEGATIVE NEGATIVE    Comment: (NOTE) SARS-CoV-2 target nucleic acids are NOT DETECTED.  The SARS-CoV-2 RNA is generally detectable in upper respiratory specimens during the acute phase of infection. The lowest concentration of SARS-CoV-2 viral copies this assay can detect is 138  copies/mL. A negative result does not preclude SARS-Cov-2 infection and should not be used as the sole basis for treatment or other patient management decisions. A negative result may occur with  improper specimen collection/handling, submission of specimen other than nasopharyngeal swab, presence of viral mutation(s) within the areas targeted by this assay, and inadequate number of viral copies(<138 copies/mL). A negative result must be combined with clinical observations, patient history, and epidemiological information. The expected result is Negative.  Fact Sheet for Patients:  BloggerCourse.com  Fact Sheet for Healthcare Providers:  SeriousBroker.it  This test is no t yet approved or cleared by the [place] FDA and  has been authorized for detection and/or diagnosis of SARS-CoV-2 by FDA under an Emergency Use Authorization (EUA). This EUA will remain  in effect (meaning this test can be used) for the duration of the COVID-19 declaration under  Section 564(b)(1) of the Act, 21 U.S.C.section 360bbb-3(b)(1), unless the authorization is terminated  or revoked sooner.       Influenza A by PCR NEGATIVE NEGATIVE   Influenza B by PCR NEGATIVE NEGATIVE    Comment: (NOTE) The Xpert Xpress SARS-CoV-2/FLU/RSV plus assay is intended as an aid in the diagnosis of influenza from Nasopharyngeal swab specimens and should not be used as a sole basis for treatment. Nasal washings and aspirates are unacceptable for Xpert Xpress SARS-CoV-2/FLU/RSV testing.  Fact Sheet for Patients: BloggerCourse.com  Fact Sheet for Healthcare Providers: SeriousBroker.it  This test is not yet approved or cleared by the [place] FDA and has been authorized for detection and/or diagnosis of SARS-CoV-2 by FDA under an Emergency Use Authorization (EUA). This EUA will remain in effect (meaning this test  can be used) for the duration of the COVID-19 declaration under Section 564(b)(1) of the Act, 21 U.S.C. section 360bbb-3(b)(1), unless the authorization is terminated or revoked.  Performed at [facility], [address], [place], [place] [address]   Comprehensive metabolic panel     Status: Abnormal   Collection Time: 07/11/21  1:21 PM  Result Value Ref Range   Sodium 139 135 - 145 mmol/L   Potassium 3.8 3.5 - 5.1 mmol/L   Chloride 103 98 - 111 mmol/L   CO2 24 22 - 32 mmol/L   Glucose, Bld 99 70 - 99 mg/dL    Comment: Glucose reference range applies only to samples taken after fasting for at least 8 hours.   BUN <5 (L) 6 - 20 mg/dL   Creatinine, Ser 5.68 0.61 - 1.24 mg/dL   Calcium 8.3 (L) 8.9 - 10.3 mg/dL   Total Protein 6.8 6.5 - 8.1 g/dL   Albumin 3.5 3.5 - 5.0 g/dL   AST 58 (H) 15 - 41 U/L   ALT 42 0 - 44 U/L   Alkaline Phosphatase 85 38 - 126 U/L   Total Bilirubin 0.9 0.3 - 1.2 mg/dL   GFR, Estimated >12 >75 mL/min    Comment: (NOTE) Calculated using the CKD-EPI Creatinine Equation (2021)    Anion gap 12 5 - 15    Comment: Performed at [facility], [address] [place], [place] [address]  Ethanol     Status: Abnormal   Collection Time: 07/11/21  1:21 PM  Result Value Ref Range   Alcohol, Ethyl (B) 388 (HH) <10 mg/dL    Comment: CRITICAL RESULT CALLED TO, READ BACK BY AND VERIFIED WITH: Teryl Lucy RN 07/11/21 1516 M KOROLESKI (NOTE) Lowest detectable limit for serum alcohol is 10 mg/dL.  For medical purposes only. Performed at [facility], [address] [place], [place] [address]   CBC with Diff     Status: Abnormal   Collection Time: 07/11/21  1:21 PM  Result Value Ref Range   WBC 4.2 4.0 - 10.5 K/uL   RBC 3.43 (L) 4.22 - 5.81 MIL/uL   Hemoglobin 11.5 (L) 13.0 - 17.0 g/dL   HCT 96.7 (L) 59.1 - 63.8 %   MCV 98.8 80.0 - 100.0 fL   MCH 33.5 26.0 - 34.0 pg   MCHC 33.9 30.0 - 36.0 g/dL   RDW 46.6 (H) 59.9 - 35.7 %   Platelets 148 (L)  150 - 400 K/uL   nRBC 0.0 0.0 - 0.2 %   Neutrophils Relative % 60 %   Neutro Abs 2.5 1.7 - 7.7 K/uL   Lymphocytes Relative 27 %  Lymphs Abs 1.1 0.7 - 4.0 K/uL   Monocytes Relative 9 %   Monocytes Absolute 0.4 0.1 - 1.0 K/uL   Eosinophils Relative 3 %   Eosinophils Absolute 0.1 0.0 - 0.5 K/uL   Basophils Relative 1 %   Basophils Absolute 0.1 0.0 - 0.1 K/uL   Immature Granulocytes 0 %   Abs Immature Granulocytes 0.01 0.00 - 0.07 K/uL    Comment: Performed at [facility], [address], [place], [place] [address]    Medications:  Current Facility-Administered Medications  Medication Dose Route Frequency Provider Last Rate Last Admin   LORazepam (ATIVAN) injection 0-4 mg  0-4 mg Intravenous Q6H Koleen Distance, MD   1 mg at 07/12/21 3419   Or   LORazepam (ATIVAN) tablet 0-4 mg  0-4 mg Oral Q6H Koleen Distance, MD   2 mg at 07/12/21 1128   [START ON 07/13/2021] LORazepam (ATIVAN) injection 0-4 mg  0-4 mg Intravenous Q12H Koleen Distance, MD       Or   Melene Muller ON 07/13/2021] LORazepam (ATIVAN) tablet 0-4 mg  0-4 mg Oral Q12H Koleen Distance, MD       thiamine tablet 100 mg  100 mg Oral Daily Koleen Distance, MD   100 mg at 07/12/21 1127   Or   thiamine (B-1) injection 100 mg  100 mg Intravenous Daily Koleen Distance, MD   100 mg at 07/11/21 1712   Current Outpatient Medications  Medication Sig Dispense Refill   bictegravir-emtricitabine-tenofovir AF (BIKTARVY) 50-200-25 MG TABS tablet Take 1 tablet by mouth daily. 30 tablet 2   chlordiazePOXIDE (LIBRIUM) 25 MG capsule 50mg  PO TID x 1D, then 25-50mg  PO BID X 1D, then 25-50mg  PO QD X 1D (Patient not taking: No sig reported) 10 capsule 0   ondansetron (ZOFRAN ODT) 4 MG disintegrating tablet Take 1 tablet (4 mg total) by mouth every 8 (eight) hours as needed for nausea or vomiting. (Patient not taking: Reported on 07/11/2021) 20 tablet 0    Musculoskeletal: Strength & Muscle Tone: within normal limits Gait & Station: normal Patient leans:  N/A          Psychiatric Specialty Exam:  Presentation  General Appearance: Appropriate for Environment  Eye Contact:Good  Speech:Clear and Coherent; Normal Rate  Speech Volume:Normal  Handedness:Right   Mood and Affect  Mood:Depressed; Anxious  Affect:Congruent   Thought Process  Thought Processes:Coherent; Goal Directed  Descriptions of Associations:Intact  Orientation:Full (Time, Place and Person)  Thought Content:WDL  History of Schizophrenia/Schizoaffective disorder:No  Duration of Psychotic Symptoms:Less than six months  Hallucinations:Hallucinations: None  Ideas of Reference:None  Suicidal Thoughts:Suicidal Thoughts: Yes, Passive (Patient stating he wants to stop drinking alcohol and get life together) SI Passive Intent and/or Plan: With Plan; With Means to Carry Out (Reporting plan to drink himself to death if unable to get help)  Homicidal Thoughts:Homicidal Thoughts: No   Sensorium  Memory:Immediate Good; Recent Good  Judgment:Fair  Insight:Fair   Executive Functions  Concentration:Good  Attention Span:Good  Recall:Good  Fund of Knowledge:Good  Language:Good   Psychomotor Activity  Psychomotor Activity:Psychomotor Activity: Tremor   Assets  Assets:Communication Skills; Desire for Improvement; Social Support; Resilience   Sleep  Sleep:Sleep: Fair    Physical Exam: Physical Exam Vitals and nursing note reviewed. Exam conducted with a chaperone present.  Constitutional:      General: He is not in acute distress.    Appearance: Normal appearance. He is not ill-appearing.  Cardiovascular:  Rate and Rhythm: Normal rate.  Pulmonary:     Effort: Pulmonary effort is normal.  Neurological:     Mental Status: He is alert and oriented to person, place, and time.  Psychiatric:        Attention and Perception: Attention and perception normal.        Mood and Affect: Affect normal. Mood is anxious and depressed.         Speech: Speech normal.        Behavior: Behavior normal. Behavior is cooperative.        Thought Content: Thought content is not paranoid or delusional. Thought content includes suicidal (Reporting suicidal thoughts of drinking self to death if unable to get help) ideation. Thought content does not include homicidal ideation.        Cognition and Memory: Cognition and memory normal.        Judgment: Judgment is impulsive.   Review of Systems  Constitutional:  Positive for diaphoresis and malaise/fatigue.  HENT: Negative.    Eyes: Negative.   Respiratory: Negative.    Cardiovascular: Negative.   Gastrointestinal:  Positive for nausea and vomiting.  Genitourinary: Negative.   Musculoskeletal: Negative.   Skin: Negative.   Neurological: Negative.   Endo/Heme/Allergies: Negative.   Psychiatric/Behavioral:  Positive for depression, substance abuse (Alcohol.  Polysubstance) and suicidal ideas (Will drink self to death if unable to get help). Negative for hallucinations. The patient is nervous/anxious. The patient does not have insomnia.        Reporting symptoms from alcohol withdrawal.    Suicidal ideation to drink self to death if he is unable to get help.  Wanting detox and rehab services.    States went to 2 week rehab "at the beginning of year" and states he was sober for months.   Blood pressure 126/74, pulse 82, temperature 98.4 F (36.9 C), temperature source Oral, resp. rate 20, SpO2 98 %. There is no height or weight on file to calculate BMI.  Treatment Plan Summary: Daily contact with patient to assess and evaluate symptoms and progress in treatment, Medication management, and Plan Inpatient psychiatric treatment  Disposition: No evidence of imminent risk to self or others at present.   Patient does not meet criteria for psychiatric inpatient admission. Supportive therapy provided about ongoing stressors. Refer to IOP. Discussed crisis plan, support from social network,  calling 911, coming to the Emergency Department, and calling Suicide Hotline.  This service was provided via telemedicine using a 2-way, interactive audio and video technology.  Names of all persons participating in this telemedicine service and their role in this encounter. Name: Assunta Found Role: NP  Name: Dr. Nelly Rout Role: Psychiatrist  Name: Arvid Right Role: Patient  Name:  Role:      Addendum:  Patient now stating that he wants to leave the ED and that he doesn't want treatment. Patient has had at least 35 ED visits in the last 6 months with similar or same complaint.  Patient has frequently refused treatment and hasn't followed up with resources given.  Patient is voluntary.    Patient ongoing endorsement of suicidal ideation shows clear evidence of secondary gain of unmet needs for housing, that is representative of limited and often- maladaptive coping skills and rather than an indicator of imminent risk of death.  Evidence indicates that subsequent suicide attempts by patients who made contingent suicide threats (defined as threatened suicide or exaggerated suicidality) are uncommon in both groups.  Hospitalization should not be used  as a substitute for social services, substance abuse treatment, and legal assistance for patients who make contingent suicide threats (Characteristics and six-month outcome of patients who use suicide threats to seek hospital admission.  (1966). Psychiatric Services, 47(8), [phone]. (DOI: 10.1176/ps.47.8.871).  Patients repeated use of emergency department and inpatient services instead of recommended outpatient follow-up is ineffective in helping improvement.  Inpatient hospitalization is not indicated, and the patient is refusing interventions that the clinical care team has offered in the emergency department and prior inpatient clinical care team has offered to mitigate risk of self-harm, other than allowing us to observe the patient to sobriety or  behavior.  This provider and Dr. Nelly RoutArchana Kumar have discussed assessment and treatment recommendations with patient.  Further we see no evidence of severe psychosis, cognitive impairment, intoxication, or other condition that prevents the patient from acting under their own choice and volition.        Tambi Thole, NP 07/12/2021 2:37 PM

## 2021-07-12 NOTE — ED Notes (Signed)
Ambulated to restroom at this time 

## 2021-07-12 NOTE — ED Notes (Signed)
Pt gave verbal consent to fax information and referral to [facility] for possible placement.

## 2021-07-12 NOTE — Progress Notes (Signed)
Inpatient Behavioral Health Placement   Meets inpatient criteria per [name], NP. Per [name], RN [facility]) there are no appropriate beds at [facility]. Referral was sent to the following facilities:   Destination Service Provider Address Phone Fax  [facility]  [address], [place] [address] [phone]  [facility]  [address] [place] [address] [phone]  [facility]  [address] [place], [place] [address] [phone]  [facility]  [phone] [place] [address] [phone]  [facility]  [address] [place] [address] [phone]  [facility]  [address], [place] [address] [phone]  [facility]  [address], [place] [address] [phone]  [facility]  [address] [place] [address] [phone]  [facility]  [address] [place] [place] [address] [phone]  [facility]  [address] [place] [address] [phone]  [facility]  [address] [place], [place] [address]     Situation ongoing,  CSW will follow up.   Maryjean Ka, MSW, LCSWA 07/12/2021  @ 12:30 AM

## 2021-07-12 NOTE — ED Notes (Signed)
Pt provided with nonslip socks. Pt has blister on right heel, blister dressed with bandaid prior to placing socks.

## 2021-07-12 NOTE — ED Notes (Signed)
Received verbal report from Amanda P RN at this time 

## 2021-07-12 NOTE — ED Notes (Signed)
Pt requesting to be discharged if he is not getting bed placement. Explained to pt bed placement was being sought for pt at this time. Dr Rhunette Croft notified and is at bedside to speak with pt

## 2021-07-12 NOTE — ED Notes (Signed)
Pt requested for anxiety. CIWA performed and pt medicated.

## 2021-07-12 NOTE — ED Provider Notes (Signed)
Emergency Medicine Observation Re-evaluation Note  Gary [name] is a 35 y.o. male, seen on rounds today.  Pt initially presented to the ED for complaints of Panic Attack and Alcohol Problem Currently, the patient is lying in bed, ate breakfast, inquiring about next dose of medications..  Physical Exam  BP 125/79 (BP Location: Right Arm)   Pulse 82   Temp 98.4 F (36.9 C) (Oral)   Resp 19   SpO2 98%  Physical Exam General: Awake, alert Cardiac:  Lungs: Respirations even and unlabored Psych: Calm and cooperative  ED Course / MDM  EKG:   I have reviewed the labs performed to date as well as medications administered while in observation.  Recent changes in the last 24 hours include pending placement.  Plan  Current plan is for placement for inpatient treatment.  Gary [name] is not under involuntary commitment.     Jeannie Fend, PA-C 07/12/21 6553    Cheryll Cockayne, MD 07/16/21 [phone]

## 2021-07-12 NOTE — ED Notes (Signed)
Pt watching tv in bed. NAD noted.

## 2021-07-12 NOTE — ED Notes (Signed)
Pt sitting up in bed watching tv.

## 2021-07-12 NOTE — ED Notes (Signed)
Breakfast Ordered 

## 2021-07-13 LAB — RAPID URINE DRUG SCREEN, HOSP PERFORMED
Amphetamines: NOT DETECTED
Barbiturates: NOT DETECTED
Benzodiazepines: POSITIVE — AB
Cocaine: NOT DETECTED
Opiates: NOT DETECTED
Tetrahydrocannabinol: NOT DETECTED

## 2021-07-13 NOTE — ED Notes (Signed)
Placed Breakfast Order 

## 2021-07-13 NOTE — ED Notes (Signed)
Patient given discharge instructions, all questions answered. Patient in possession of all belongings, directed to the discharge area  

## 2021-07-13 NOTE — Consult Note (Signed)
Telepsych Consultation   Reason for Consult:  Alcohol withdrawal and suicidal ideation Referring Physician:  Linwood Dibbles, MD Location of Patient: [facility] ED Location of Provider: Other: Home Office  Patient Identification: Gary [name] MRN:  [id number] Principal Diagnosis: Alcohol-induced mood disorder (HCC) Diagnosis:  Principal Problem:   Alcohol-induced mood disorder (HCC) Active Problems:   Polysubstance abuse (HCC)   Alcohol use disorder, severe, dependence (HCC)   Major depressive disorder, recurrent severe without psychotic features (HCC)   Substance induced mood disorder (HCC)   Suicidal ideation   Total Time spent with patient: 30 minutes  Subjective:  "I want to leave.  They are not giving me enough medicine for my withdrawal."   Psychiatric Assessment 07/13/21 Gary [name], 35 y.o., male patient seen via tele health by this provider, consulted with Dr. Nelly Rout; and chart reviewed on 07/13/21.  On evaluation Gary [name] reports he is feeling better and would like to leave the hospital.  Patient states that the doctors in the emergency room will not give him more medicine for his alcohol withdrawal and that he is unable to "eat, drink, or keep anything down.  They say if I don't eat I can't get medicine.  I can do better by myself."  Patient asked if he was willing to take a suppository to help with nausea and vomiting since the PO medications were not staying down and patient declined.  Patient wanting to be given more Ativan.  Patient informed that he was being given medications that was coming from the alcohol detox protocol and it is commonly used with the majority of alcohol withdrawal.  Informed that he could not be given more than the recommended dose because it could be dangerous.  Patient irritable and wanting to leave if he is not going to be given more Ativan.  Patient asked why he refused [facility] detox rehab "Cause I've been there before and they are worse than  this place.  They don't give you enough medicine and then they make you get out of bed and go to stuff that ain't even necessary; so you are walking around with a bunch of people that are sick, vomiting, and shitting.  I'm just ready to go."  Patient denies suicidal/self-harm/homicidal ideation, psychosis, and paranoia.  During evaluation Gary [name] is sitting up in bed in no acute distress.  He is alert, oriented x 4, calm and cooperative.  His mood is irritable with congruent affect.  He does not appear to be responding to internal/external stimuli or delusional thoughts.  Patient denies suicidal/self-harm/homicidal ideation, psychosis, and paranoia.  Patient answered question appropriately. At this time Gary [name] is educated and verbalizes understanding of mental health resources and other crisis services in the community. He is instructed to call 911 and present to the nearest emergency room should he experience any suicidal/homicidal ideation, auditory/visual/hallucinations, or detrimental worsening of his mental health condition.  Informed resources for substance use detox and rehab services would be added to his discharge forms  Patient ongoing endorsement of suicidal ideation shows clear evidence of secondary gain of unmet needs for housing, that is representative of limited and often- maladaptive coping skills and rather than an indicator of imminent risk of death.  Evidence indicates that subsequent suicide attempts by patients who made contingent suicide threats (defined as threatened suicide or exaggerated suicidality) are uncommon in both groups.  Hospitalization should not be used as a substitute for social services, substance abuse treatment, and legal assistance  for patients who make contingent suicide threats (Characteristics and six-month outcome of patients who use suicide threats to seek hospital admission.  (1966). Psychiatric Services, 47(8), [phone]. (DOI: 10.1176/ps.47.8.871).   Patients repeated use of emergency department and inpatient services instead of recommended outpatient follow-up is ineffective in helping improvement.  Inpatient hospitalization is not indicated, and the patient is refusing interventions that the clinical care team has offered in the emergency department and prior inpatient clinical care team has offered to mitigate risk of self-harm, other than allowing [place] to observe the patient to sobriety or behavior.  This provider and Dr. Nelly Rout have discussed assessment and treatment recommendations with patient.  Further we see no evidence of severe psychosis, cognitive impairment, intoxication, or other condition that prevents the patient from acting under their own choice and volition.   Psychiatric assessment 07/12/21 Gary [name] is a 35 y.o. male patient admitted to [facility] ED after he presented requesting alcohol detox and complaints of depression and suicidal ideation.  HPI:  Gary [name], 35 y.o., male patient seen via tele health by this provider, consulted with Dr. Nelly Rout; and chart reviewed on 07/13/21.  On evaluation Gary [name] reports he was told by friends he needed to come to hospital to get help related to his depression and suicidal ideation.   Patient reporting he wants to stop drinking and get his life together.  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  Patient states he doesn't have any outpatient psychiatric services at this time.  Patient reports he has been to rehab only once and it was at "the beginning of this year."  States he was sober for months after rehab.  Patient stating that he is suicidal and unable to contract for safety stating that he will drink himself to death if he is unable to get help.  Patient currently reporting symptoms from withdrawal.   Patient reporting he is interested in detox and rehab.    During evaluation Gary [name] is sitting up in bed in no acute distress.  He is alert, oriented x 4, calm and cooperative.  His mood is dysphoric, anxious with congruent affect.  He does not appear to be responding to internal/external stimuli or delusional thoughts.  Patient denies homicidal ideation, psychosis, and paranoia; patient continues to endorse suicidal ideation if he is unable to get help with alcohol detox and rehab.    Patient has a history of frequent ED visits requesting assistance with alcohol detox and rehab service but has also frequently refused referrals and has been noncompliant with follow up.  Patient offered [facility] detox/rehab today and stated he has been to [facility] before and doesn't feel it's truly a detox program and has no interest in it.     Past Psychiatric History: See above  Risk to Self:  Stating suicidal if unable to get help with alcohol detox/rehab.  Stating he will drink himself to death Risk to Others:  Denies Prior Inpatient Therapy:  yes Prior Outpatient Therapy:  Yes  Past Medical History:  Past Medical History:  Diagnosis Date   Alcohol abuse    HIV (human immunodeficiency virus infection) (HCC)    Seizures (HCC)    Subdural hematoma (HCC)     Past Surgical History:  Procedure Laterality Date   INCISION AND DRAINAGE PERIRECTAL ABSCESS N/A 09/24/2016  Procedure: IRRIGATION AND DEBRIDEMENT PERIRECTAL ABSCESS;  Surgeon: Ricarda Frameharles Woodham, MD;  Location: [facility] ORS;  Service: General;  Laterality: N/A;   none     Family History:  Family History  Problem Relation Age of Onset   Alcohol abuse Mother    Alcohol abuse Father    Family Psychiatric  History: See above Social History:  Social History   Substance and Sexual Activity  Alcohol Use Yes   Comment: "I drink all I can"     Social History   Substance and Sexual Activity  Drug Use Not Currently   Types: Marijuana,  Methamphetamines   Comment: last used 04/10/2019    Social History   Socioeconomic History   Marital status: Single    Spouse name: Not on file   Number of children: Not on file   Years of education: Not on file   Highest education level: Not on file  Occupational History   Occupation: unemployed  Tobacco Use   Smoking status: Every Day    Packs/day: 0.50    Types: Cigarettes   Smokeless tobacco: Never   Tobacco comments:    unable to smoke while incarcerated 6+ months 02/13/20  Vaping Use   Vaping Use: Never used  Substance and Sexual Activity   Alcohol use: Yes    Comment: "I drink all I can"   Drug use: Not Currently    Types: Marijuana, Methamphetamines    Comment: last used 04/10/2019   Sexual activity: Yes    Partners: Male, Male    Comment: declined condoms  03/2021  Other Topics Concern   Not on file  Social History Narrative   "Currently living on the streets"   Independent at baseline   Social Determinants of Health   Financial Resource Strain: Not on file  Food Insecurity: Not on file  Transportation Needs: Not on file  Physical Activity: Not on file  Stress: Not on file  Social Connections: Not on file   Additional Social History:    Allergies:   Allergies  Allergen Reactions   Tegretol [Carbamazepine] Other (See Comments)    Caused vertigo for 2 days after taking it   Caffeine Palpitations    Labs:  Results for orders placed or performed during the hospital encounter of 07/11/21 (from the past 48 hour(s))  Urine rapid drug screen (hosp performed)     Status: Abnormal   Collection Time: 07/11/21 11:25 PM  Result Value Ref Range   Opiates NONE DETECTED NONE DETECTED   Cocaine NONE DETECTED NONE DETECTED   Benzodiazepines POSITIVE (A) NONE DETECTED   Amphetamines NONE DETECTED NONE DETECTED   Tetrahydrocannabinol NONE DETECTED NONE DETECTED   Barbiturates NONE DETECTED NONE DETECTED    Comment: (NOTE) DRUG SCREEN FOR MEDICAL PURPOSES ONLY.   IF CONFIRMATION IS NEEDED FOR ANY PURPOSE, NOTIFY LAB WITHIN 5 DAYS.  LOWEST DETECTABLE LIMITS FOR URINE DRUG SCREEN Drug Class                     Cutoff (ng/mL) Amphetamine and metabolites    1000 Barbiturate and metabolites    200 Benzodiazepine                 200 Tricyclics and metabolites     300 Opiates and metabolites        300 Cocaine and metabolites        300 THC  50 Performed at [facility], [address], [place], [place] [address]     Medications:  Current Facility-Administered Medications  Medication Dose Route Frequency Provider Last Rate Last Admin   LORazepam (ATIVAN) injection 0-4 mg  0-4 mg Intravenous Q12H Koleen Distance, MD       Or   LORazepam (ATIVAN) tablet 0-4 mg  0-4 mg Oral Q12H Koleen Distance, MD       thiamine tablet 100 mg  100 mg Oral Daily Koleen Distance, MD   100 mg at 07/13/21 6045   Or   thiamine (B-1) injection 100 mg  100 mg Intravenous Daily Koleen Distance, MD   100 mg at 07/11/21 1712   Current Outpatient Medications  Medication Sig Dispense Refill   bictegravir-emtricitabine-tenofovir AF (BIKTARVY) 50-200-25 MG TABS tablet Take 1 tablet by mouth daily. 30 tablet 2   chlordiazePOXIDE (LIBRIUM) 25 MG capsule  PO TID x 1D, then 25-50mg  PO BID X 1D, then 25-50mg  PO QD X 1D (Patient not taking: No sig reported) 10 capsule 0   ondansetron (ZOFRAN ODT) 4 MG disintegrating tablet Take 1 tablet (4 mg total) by mouth every 8 (eight) hours as needed for nausea or vomiting. (Patient not taking: Reported on 07/11/2021) 20 tablet 0    Musculoskeletal: Strength & Muscle Tone: within normal limits Gait & Station: normal Patient leans: N/A    Psychiatric Specialty Exam:  Presentation  General Appearance: Appropriate for Environment  Eye Contact:Good  Speech:Clear and Coherent; Normal Rate  Speech Volume:Normal  Handedness:Right   Mood and Affect  Mood:Dysphoric  Affect:Appropriate;  Congruent   Thought Process  Thought Processes:Coherent; Goal Directed  Descriptions of Associations:Intact  Orientation:Full (Time, Place and Person)  Thought Content:WDL  History of Schizophrenia/Schizoaffective disorder:No  Duration of Psychotic Symptoms:Less than six months  Hallucinations:Hallucinations: None  Ideas of Reference:None  Suicidal Thoughts:Suicidal Thoughts: No (Denies at this time) SI Passive Intent and/or Plan: With Plan; With Means to Carry Out (Reporting plan to drink himself to death if unable to get help)  Homicidal Thoughts:Homicidal Thoughts: No   Sensorium  Memory:Immediate Good; Recent Good; Remote Good  Judgment:Intact  Insight:Present   Executive Functions  Concentration:Good  Attention Span:Good  Recall:Good  Fund of Knowledge:Good  Language:Good   Psychomotor Activity  Psychomotor Activity:Psychomotor Activity: Normal   Assets  Assets:Communication Skills; Desire for Improvement; Social Support   Sleep  Sleep:Sleep: Good    Physical Exam: Physical Exam Vitals and nursing note reviewed. Exam conducted with a chaperone present.  Constitutional:      General: He is not in acute distress.    Appearance: Normal appearance. He is not ill-appearing.  Cardiovascular:     Rate and Rhythm: Normal rate.  Pulmonary:     Effort: Pulmonary effort is normal.  Neurological:     Mental Status: He is alert and oriented to person, place, and time.  Psychiatric:        Attention and Perception: Attention and perception normal. He does not perceive auditory or visual hallucinations.        Mood and Affect: Mood and affect normal.        Speech: Speech normal.        Behavior: Behavior is agitated. Behavior is cooperative.        Thought Content: Thought content is not paranoid or delusional. Thought content includes suicidal (Denies at this time) ideation. Thought content does not include homicidal ideation.        Cognition and  Memory: Cognition and memory normal.        Judgment: Judgment is impulsive.   Review of Systems  Constitutional: Negative.   HENT: Negative.    Eyes: Negative.   Respiratory: Negative.    Cardiovascular: Negative.   Gastrointestinal:  Positive for nausea and vomiting.  Genitourinary: Negative.   Musculoskeletal: Negative.   Skin: Negative.   Neurological: Negative.   Endo/Heme/Allergies: Negative.   Psychiatric/Behavioral:  Positive for substance abuse (Alcohol.  Polysubstance) and suicidal ideas (Will drink self to death if unable to get help). Negative for hallucinations (Denies). Depression: Stable.The patient does not have insomnia. Nervous/anxious: Stable.       Patient upset because feels he is not given enough Ativan to help with alcohol withdrawal.  Also reporting he refused [facility] because they are worse than current facility not given enough medication but also make get out of bed.   Patient requesting to go home    Blood pressure 127/86, pulse 96, temperature 98.4 F (36.9 C), temperature source Oral, resp. rate 19, SpO2 100 %. There is no height or weight on file to calculate BMI.  Treatment Plan Summary: Daily contact with patient to assess and evaluate symptoms and progress in treatment, Medication management, and Plan Inpatient psychiatric treatment  Disposition: No evidence of imminent risk to self or others at present.   Patient does not meet criteria for psychiatric inpatient admission. Supportive therapy provided about ongoing stressors. Refer to IOP. Discussed crisis plan, support from social network, calling 911, coming to the Emergency Department, and calling Suicide Hotline.  This service was provided via telemedicine using a 2-way, interactive audio and video technology.  Names of all persons participating in this telemedicine service and their role in this encounter. Name: Assunta Found Role: NP  Name: Dr. Nelly Rout Role: Psychiatrist  Name: Arvid Right Role: Patient  Name:  Role:       Secure message sent to patient's nurse that patient psychiatrically cleared.    Emberlyn Burlison, NP 07/13/2021 2:13 PM

## 2021-07-13 NOTE — ED Notes (Signed)
RN walked pt to bathroom  

## 2021-07-15 ENCOUNTER — Other Ambulatory Visit (HOSPITAL_COMMUNITY): Payer: Self-pay

## 2021-07-20 ENCOUNTER — Emergency Department (HOSPITAL_COMMUNITY)
Admission: EM | Admit: 2021-07-20 | Discharge: 2021-07-21 | Disposition: A | Discharge status: Home / Self Care | Payer: Self-pay | Attending: Emergency Medicine | Admitting: Emergency Medicine

## 2021-07-20 ENCOUNTER — Other Ambulatory Visit: Payer: Self-pay

## 2021-07-20 DIAGNOSIS — R000 Tachycardia, unspecified: Secondary | ICD-10-CM | POA: Insufficient documentation

## 2021-07-20 DIAGNOSIS — F17210 Nicotine dependence, cigarettes, uncomplicated: Secondary | ICD-10-CM | POA: Insufficient documentation

## 2021-07-20 DIAGNOSIS — F10920 Alcohol use, unspecified with intoxication, uncomplicated: Secondary | ICD-10-CM

## 2021-07-20 DIAGNOSIS — R059 Cough, unspecified: Secondary | ICD-10-CM | POA: Insufficient documentation

## 2021-07-20 DIAGNOSIS — K2920 Alcoholic gastritis without bleeding: Secondary | ICD-10-CM

## 2021-07-20 DIAGNOSIS — R112 Nausea with vomiting, unspecified: Secondary | ICD-10-CM

## 2021-07-20 DIAGNOSIS — Z21 Asymptomatic human immunodeficiency virus [HIV] infection status: Secondary | ICD-10-CM | POA: Insufficient documentation

## 2021-07-20 DIAGNOSIS — Y908 Blood alcohol level of 240 mg/100 ml or more: Secondary | ICD-10-CM | POA: Insufficient documentation

## 2021-07-20 DIAGNOSIS — R519 Headache, unspecified: Secondary | ICD-10-CM | POA: Insufficient documentation

## 2021-07-20 DIAGNOSIS — Z20822 Contact with and (suspected) exposure to covid-19: Secondary | ICD-10-CM | POA: Insufficient documentation

## 2021-07-20 DIAGNOSIS — Z28310 Unvaccinated for covid-19: Secondary | ICD-10-CM | POA: Insufficient documentation

## 2021-07-20 LAB — COMPREHENSIVE METABOLIC PANEL
ALT: 35 U/L (ref 0–44)
AST: 52 U/L — ABNORMAL HIGH (ref 15–41)
Albumin: 4.3 g/dL (ref 3.5–5.0)
Alkaline Phosphatase: 89 U/L (ref 38–126)
Anion gap: 11 (ref 5–15)
BUN: 5 mg/dL — ABNORMAL LOW (ref 6–20)
CO2: 26 mmol/L (ref 22–32)
Calcium: 8.9 mg/dL (ref 8.9–10.3)
Chloride: 101 mmol/L (ref 98–111)
Creatinine, Ser: 0.61 mg/dL (ref 0.61–1.24)
GFR, Estimated: >60 mL/min (ref >60)
Glucose, Bld: 97 mg/dL (ref 70–99)
Potassium: 3.6 mmol/L (ref 3.5–5.1)
Sodium: 138 mmol/L (ref 135–145)
Total Bilirubin: 0.9 mg/dL (ref 0.3–1.2)
Total Protein: 8.1 g/dL (ref 6.5–8.1)

## 2021-07-20 LAB — CBC WITH DIFFERENTIAL/PLATELET
Abs Immature Granulocytes: 0.01 10*3/uL (ref 0.00–0.07)
Basophils Absolute: 0.1 10*3/uL (ref 0.0–0.1)
Basophils Relative: 2 %
Eosinophils Absolute: 0.1 10*3/uL (ref 0.0–0.5)
Eosinophils Relative: 4 %
HCT: 42.8 % (ref 39.0–52.0)
Hemoglobin: 14.4 g/dL (ref 13.0–17.0)
Immature Granulocytes: 0 %
Lymphocytes Relative: 43 %
Lymphs Abs: 1.6 10*3/uL (ref 0.7–4.0)
MCH: 33.6 pg (ref 26.0–34.0)
MCHC: 33.6 g/dL (ref 30.0–36.0)
MCV: 100 fL (ref 80.0–100.0)
Monocytes Absolute: 0.3 10*3/uL (ref 0.1–1.0)
Monocytes Relative: 9 %
Neutro Abs: 1.5 10*3/uL — ABNORMAL LOW (ref 1.7–7.7)
Neutrophils Relative %: 42 %
Platelets: 274 10*3/uL (ref 150–400)
RBC: 4.28 MIL/uL (ref 4.22–5.81)
RDW: 17.3 % — ABNORMAL HIGH (ref 11.5–15.5)
WBC: 3.6 10*3/uL — ABNORMAL LOW (ref 4.0–10.5)
nRBC: 0 % (ref 0.0–0.2)

## 2021-07-20 LAB — ETHANOL: Alcohol, Ethyl (B): 308 mg/dL (ref <10)

## 2021-07-20 LAB — LIPASE, BLOOD: Lipase: 40 U/L (ref 11–51)

## 2021-07-20 MED ORDER — ONDANSETRON 4 MG PO TBDP: 4.0000 mg | ORAL_TABLET | Freq: Once | ORAL | Status: DC

## 2021-07-20 NOTE — ED Provider Notes (Signed)
Emergency Medicine Provider Triage Evaluation Note  HENDRYX RICKE , a 35 y.o. male  was evaluated in triage.  Pt complains of nausea and vomiting.  Patient states he drank 3 bottles of wine today.  History of alcohol abuse.  He also endorses myalgias.  No fever or chills.   Review of Systems  Positive: Nausea, vomiting Negative: fever  Physical Exam  BP 114/76 (BP Location: Left Arm)   Pulse (!) 111   Temp 98.4 F (36.9 C) (Oral)   Resp 20   Ht 6\' 1"  (1.854 m)   Wt 72.6 kg   SpO2 99%   BMI 21.11 kg/m  Gen:   Awake, no distress   Resp:  Normal effort  MSK:   Moves extremities without difficulty  Other:  Actively vomiting in triage  Medical Decision Making  Medically screening exam initiated at 5:11 PM.  Appropriate orders placed.  DUSHAUN OKEY was informed that the remainder of the evaluation will be completed by another provider, this initial triage assessment does not replace that evaluation, and the importance of remaining in the ED until their evaluation is complete.  Labs Zofran given in triage   [name] 07/20/21 1713    09/19/21, MD 07/20/21 [phone]

## 2021-07-20 NOTE — ED Notes (Signed)
X1 called for vitals no response.

## 2021-07-20 NOTE — ED Triage Notes (Signed)
Pt reports he has drank 3 bottles of wine today and is now experiencing nausea and body aches

## 2021-07-21 ENCOUNTER — Emergency Department (HOSPITAL_COMMUNITY): Payer: Self-pay

## 2021-07-21 ENCOUNTER — Other Ambulatory Visit (HOSPITAL_COMMUNITY): Payer: Self-pay

## 2021-07-21 LAB — RESP PANEL BY RT-PCR (FLU A&B, COVID) ARPGX2
Influenza A by PCR: NEGATIVE
Influenza B by PCR: NEGATIVE
SARS Coronavirus 2 by RT PCR: NEGATIVE

## 2021-07-21 IMAGING — CR DG CHEST 2V
2 series · 2 of 2 positions shown · non-contrast
Comparison: Chest radiograph dated [DATE].

CLINICAL DATA: Cough.

EXAM:
CHEST - 2 VIEW

[w chest pa]
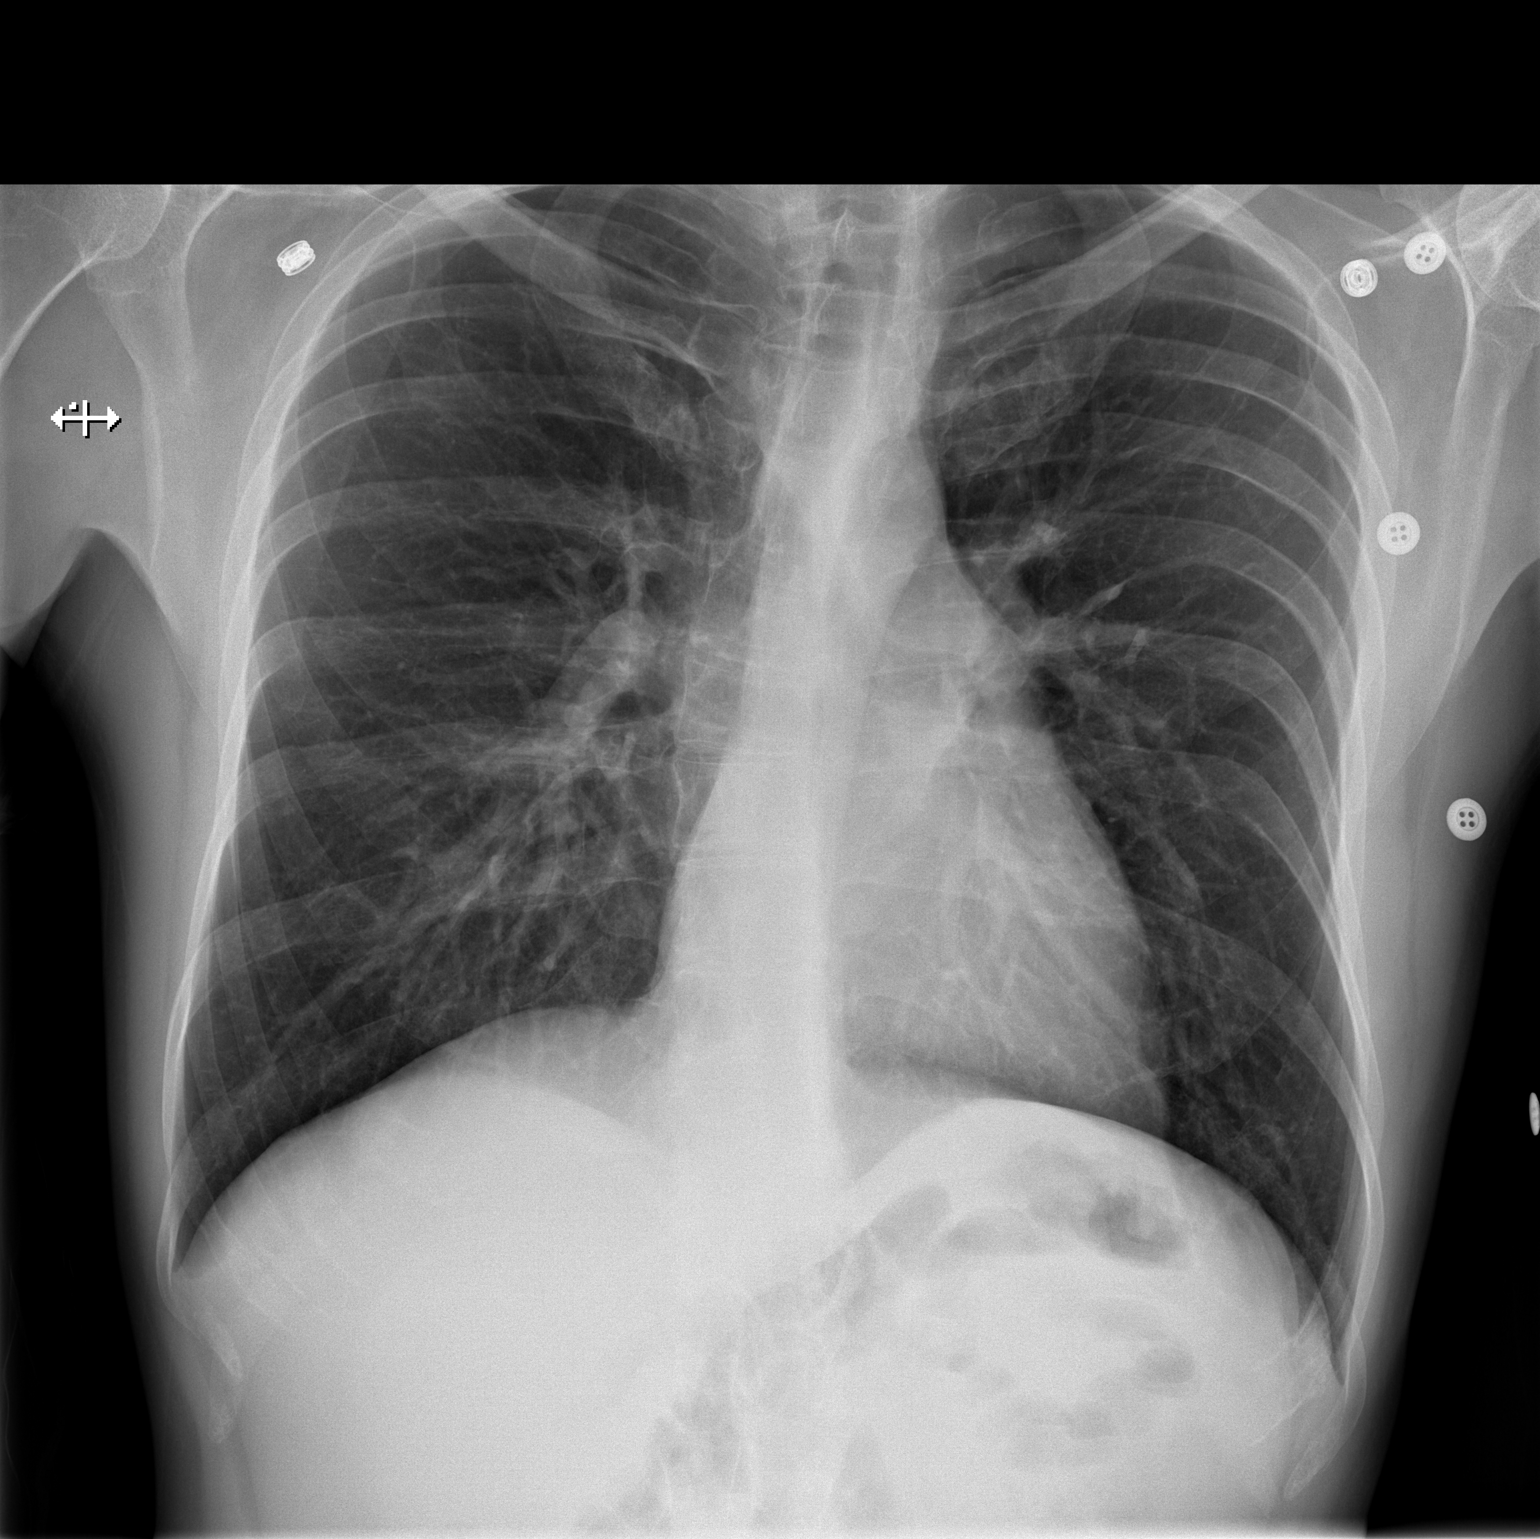

[w chest lat]
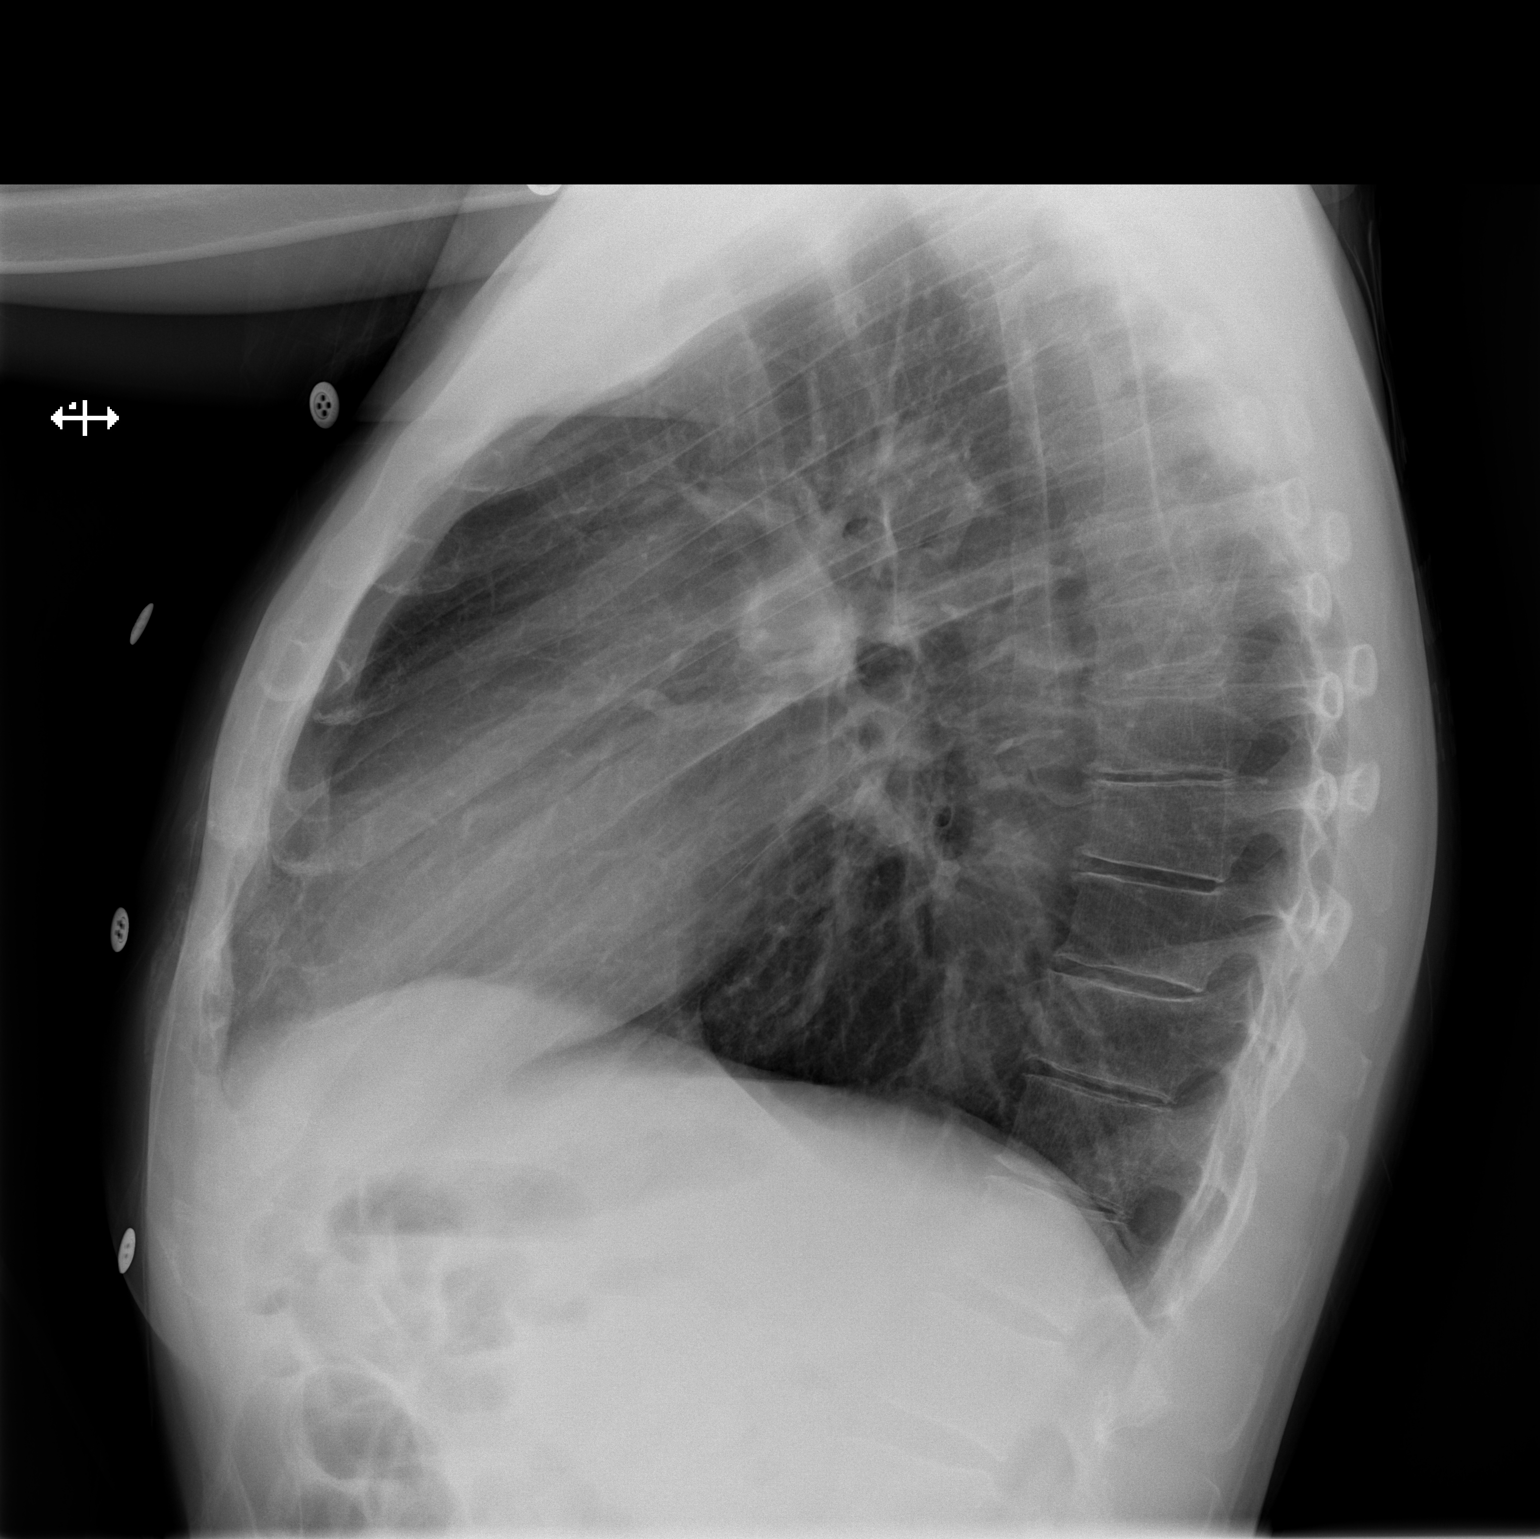

[2 of 2 positions shown; findings below may reference images not displayed]

FINDINGS: No focal consolidation, pleural effusion, or pneumothorax. The
cardiac silhouette is within normal limits. No acute osseous
pathology.
IMPRESSION: No active cardiopulmonary disease.

## 2021-07-21 MED ORDER — SODIUM CHLORIDE 0.9 % IV BOLUS
1000.0000 mL | Freq: Once | INTRAVENOUS | Status: AC
  Administered 2021-07-21: 1000 mL via INTRAVENOUS

## 2021-07-21 MED ORDER — PANTOPRAZOLE SODIUM 20 MG PO TBEC
20.0000 mg | DELAYED_RELEASE_TABLET | Freq: Every day | ORAL | 0 refills | Status: DC
  Filled 2021-07-21: qty 30, 30d supply, fill #0

## 2021-07-21 MED ORDER — ONDANSETRON HCL 4 MG/2ML IJ SOLN
4.0000 mg | Freq: Once | INTRAMUSCULAR | Status: AC
  Administered 2021-07-21: 4 mg via INTRAVENOUS
  Filled 2021-07-21: qty 2

## 2021-07-21 MED ORDER — PANTOPRAZOLE SODIUM 40 MG IV SOLR
40.0000 mg | Freq: Once | INTRAVENOUS | Status: AC
  Administered 2021-07-21: 40 mg via INTRAVENOUS
  Filled 2021-07-21: qty 40

## 2021-07-21 NOTE — Discharge Instructions (Addendum)
1. Medications: protonix, usual home medications 2. Treatment: rest, drink plenty of fluids, advance diet slowly 3. Follow Up: Please followup with your primary doctor in 2 days for discussion of your diagnoses and further evaluation after today's visit; if you do not have a primary care doctor use the resource guide provided to find one; Please return to the ER for persistent vomiting, high fevers or worsening symptoms

## 2021-07-21 NOTE — ED Provider Notes (Signed)
[facility]-EMERGENCY DEPT Provider Note   CSN: [id number] Arrival date & time: 07/20/21  1630     History Chief Complaint  Patient presents with   Alcohol Intoxication    Gary [name] is a 35 y.o. male is the emergency department with alcohol intoxication.  Also complaining of abdominal pain, nausea and vomiting.  Reports he has been unable to keep down any food or liquids for the last several days however was able to drink 3 bottles of wine without difficulty.  Denies hematemesis.  Denies melena or hematochezia.  Patient has a history of HIV and polysubstance abuse.  Patient reports over the last several days he is also had fevers, cough and headaches.  He is not vaccinated for COVID.  No known new sick contacts.  States he does have a history of pancreatitis and today feels similar.  Denies urinary symptoms.  Eating and drinking makes his symptoms worse, nothing seems to make them better.  Records reviewed.  Patient has been seen numerous times in the last 2 months for alcohol intoxication and alcohol abuse.  The history is provided by the patient and medical records. No language interpreter was used.      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   Malnutrition of moderate degree 05/07/2020   Gastrointestinal hemorrhage    Alcoholic hepatitis without ascites    Melena 05/05/2020   Alcoholic ketoacidosis 05/05/2020   Thrombocytopenia (HCC) 07/25/2019   Transaminitis 07/25/2019   Traumatic subdural  hematoma (HCC) 07/02/2019   Alcohol abuse with intoxication (HCC) 07/02/2019   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    Past Surgical History:  Procedure Laterality Date   INCISION AND DRAINAGE PERIRECTAL ABSCESS N/A 09/24/2016   Procedure: IRRIGATION AND DEBRIDEMENT PERIRECTAL ABSCESS;  Surgeon: Ricarda Frame, MD;  Location: [facility] ORS;  Service: General;  Laterality: N/A;   none         Family History  Problem Relation Age of Onset   Alcohol abuse Mother    Alcohol abuse Father     Social History   Tobacco Use   Smoking status: Every Day    Packs/day: 0.50    Types: Cigarettes   Smokeless tobacco: Never   Tobacco comments:    unable to smoke while incarcerated 6+ months 02/13/20  Vaping Use   Vaping Use: Never used  Substance Use Topics   Alcohol use: Yes    Comment: "I drink all I can"   Drug use: Not Currently    Types: Marijuana, Methamphetamines    Comment: last used 04/10/2019    Home Medications Prior to Admission medications   Medication Sig Start Date End Date Taking? Authorizing Provider  pantoprazole (PROTONIX) 20 MG tablet Take 1 tablet (20 mg total) by mouth daily. 07/21/21  Yes  Shilo Pauwels, Dahlia Client, PA-C  bictegravir-emtricitabine-tenofovir AF (BIKTARVY) 50-200-25 MG TABS tablet Take 1 tablet by mouth daily. 05/13/21   Kuppelweiser, Cassie L, RPH-CPP  chlordiazePOXIDE (LIBRIUM) 25 MG capsule 50mg  PO TID x 1D, then 25-50mg  PO BID X 1D, then 25-50mg  PO QD X 1D Patient not taking: No sig reported 07/04/21   Petrucelli, Samantha R, PA-C  ondansetron (ZOFRAN ODT) 4 MG disintegrating tablet Take 1 tablet (4 mg total) by mouth every 8 (eight) hours as needed for nausea or vomiting. Patient not taking: Reported  on 07/11/2021 06/29/21   07/01/21, MD  famotidine (PEPCID) 20 MG tablet Take 1 tablet (20 mg total) by mouth 2 (two) times daily. Patient not taking: Reported on 06/01/2019 05/14/19 06/02/19  06/04/19, MD    Allergies    Tegretol [carbamazepine] and Caffeine  Review of Systems   Review of Systems  Constitutional:  Positive for fever. Negative for appetite change, diaphoresis, fatigue and unexpected weight change.  HENT:  Negative for mouth sores.   Eyes:  Negative for visual disturbance.  Respiratory:  Positive for cough. Negative for chest tightness, shortness of breath and wheezing.   Cardiovascular:  Negative for chest pain.  Gastrointestinal:  Positive for abdominal pain, diarrhea, nausea and vomiting. Negative for constipation.  Endocrine: Negative for polydipsia, polyphagia and polyuria.  Genitourinary:  Negative for dysuria, frequency, hematuria and urgency.  Musculoskeletal:  Negative for back pain and neck stiffness.  Skin:  Negative for rash.  Allergic/Immunologic: Negative for immunocompromised state.  Neurological:  Positive for headaches. Negative for syncope and light-headedness.  Hematological:  Does not bruise/bleed easily.  Psychiatric/Behavioral:  Negative for sleep disturbance. The patient is not nervous/anxious.    Physical Exam Updated Vital Signs BP 119/76 (BP Location: Left Arm)   Pulse 94   Temp 98.5 F (36.9 C) (Oral)   Resp 16   Ht 6\' 1"  (1.854 m)   Wt 72.6 kg   SpO2 98%   BMI 21.11 kg/m   Physical Exam Vitals and nursing note reviewed.  Constitutional:      General: He is not in acute distress.    Appearance: He is not diaphoretic.  HENT:     Head: Normocephalic.  Eyes:     General: No scleral icterus.    Conjunctiva/sclera: Conjunctivae normal.  Cardiovascular:     Rate and Rhythm: Regular rhythm. Tachycardia present.     Pulses: Normal pulses.          Radial pulses are 2+ on the right side and 2+ on the left side.  Pulmonary:      Effort: Pulmonary effort is normal. No tachypnea, accessory muscle usage, prolonged expiration, respiratory distress or retractions.     Breath sounds: Normal breath sounds. No stridor.     Comments: Equal chest rise. No increased work of breathing. Abdominal:     General: Bowel sounds are normal. There is no distension.     Palpations: Abdomen is soft.     Tenderness: There is generalized abdominal tenderness and tenderness in the epigastric area. There is no right CVA tenderness, left CVA tenderness, guarding or rebound.  Musculoskeletal:     Cervical back: Normal range of motion.     Comments: Moves all extremities equally and without difficulty.  Skin:    General: Skin is warm and dry.     Capillary Refill: Capillary refill takes less than 2 seconds.  Neurological:     Mental Status: He is alert.     GCS: GCS eye  subscore is 4. GCS verbal subscore is 5. GCS motor subscore is 6.     Comments: Speech is clear and goal oriented.  Psychiatric:        Mood and Affect: Mood normal.    ED Results / Procedures / Treatments   Labs (all labs ordered are listed, but only abnormal results are displayed) Labs Reviewed  ETHANOL - Abnormal; Notable for the following components:      Result Value   Alcohol, Ethyl (B) 308 (*)    All other components within normal limits  CBC WITH DIFFERENTIAL/PLATELET - Abnormal; Notable for the following components:   WBC 3.6 (*)    RDW 17.3 (*)    Neutro Abs 1.5 (*)    All other components within normal limits  COMPREHENSIVE METABOLIC PANEL - Abnormal; Notable for the following components:   BUN 5 (*)    AST 52 (*)    All other components within normal limits  RESP PANEL BY RT-PCR (FLU A&B, COVID) ARPGX2  LIPASE, BLOOD     Radiology DG Chest 2 View  Result Date: 07/21/2021 CLINICAL DATA:  Cough. EXAM: CHEST - 2 VIEW COMPARISON:  Chest radiograph dated 07/03/2021. FINDINGS: No focal consolidation, pleural effusion, or pneumothorax. The cardiac  silhouette is within normal limits. No acute osseous pathology. IMPRESSION: No active cardiopulmonary disease. Electronically Signed   By: Elgie Collard M.D.   On: 07/21/2021 03:25    Procedures Procedures   Medications Ordered in ED Medications  ondansetron (ZOFRAN-ODT) disintegrating tablet 4 mg (has no administration in time range)  sodium chloride 0.9 % bolus 1,000 mL (0 mLs Intravenous Stopped 07/21/21 0344)  ondansetron (ZOFRAN) injection 4 mg (4 mg Intravenous Given 07/21/21 0218)  pantoprazole (PROTONIX) injection 40 mg (40 mg Intravenous Given 07/21/21 0253)    ED Course  I have reviewed the triage vital signs and the nursing notes.  Pertinent labs & imaging results that were available during my care of the patient were reviewed by me and considered in my medical decision making (see chart for details).  Clinical Course as of 07/21/21 0422  Mon Jul 21, 2021  0236 Pulse Rate(!): 111 Tachycardic on arrival. [HM]    Clinical Course User Index [HM] Laurice Iglesia, Boyd Kerbs   MDM Rules/Calculators/A&P                           Presents with alcohol intoxication and complaints of inability to keep down food or drink however was able to keep down 3 bottles of wine today.  Mucous membranes are moist.  Labs are overall reassuring.  Lipase is not elevated.  Patient likely with gastritis/subacute pancreatitis.  Abdomen is soft and mildly tender.  No rebound or guarding.  Repeat COVID test pending.  Patient will be given fluids and p.o. trial.  4:22 AM Patient vital signs have improved significantly.  Reports he is feeling better.  Has been able to eat and drink without difficulty.  Will dispo home.  Suspect alcoholic gastritis.  Patient prescribed Protonix.   Final Clinical Impression(s) / ED Diagnoses Final diagnoses:  Alcoholic intoxication without complication (HCC)  Non-intractable vomiting with nausea, unspecified vomiting type  Acute alcoholic gastritis without  hemorrhage    Rx / DC Orders ED Discharge Orders          Ordered    pantoprazole (PROTONIX) 20 MG tablet  Daily        07/21/21 0331  Kealii Thueson, Boyd KerbsHannah, PA-C 07/21/21 0423    Glynn Octaveancour, Stephen, MD 07/21/21 [phone]

## 2021-07-24 ENCOUNTER — Emergency Department (HOSPITAL_COMMUNITY)
Admission: EM | Admit: 2021-07-24 | Discharge: 2021-07-26 | Payer: Self-pay | Attending: Emergency Medicine | Admitting: Emergency Medicine

## 2021-07-24 ENCOUNTER — Emergency Department (HOSPITAL_COMMUNITY): Payer: Self-pay

## 2021-07-24 ENCOUNTER — Other Ambulatory Visit: Payer: Self-pay

## 2021-07-24 DIAGNOSIS — R45851 Suicidal ideations: Secondary | ICD-10-CM | POA: Insufficient documentation

## 2021-07-24 DIAGNOSIS — F17210 Nicotine dependence, cigarettes, uncomplicated: Secondary | ICD-10-CM | POA: Insufficient documentation

## 2021-07-24 DIAGNOSIS — F1020 Alcohol dependence, uncomplicated: Secondary | ICD-10-CM | POA: Insufficient documentation

## 2021-07-24 DIAGNOSIS — Y907 Blood alcohol level of 200-239 mg/100 ml: Secondary | ICD-10-CM | POA: Insufficient documentation

## 2021-07-24 DIAGNOSIS — Z20822 Contact with and (suspected) exposure to covid-19: Secondary | ICD-10-CM | POA: Insufficient documentation

## 2021-07-24 DIAGNOSIS — Z21 Asymptomatic human immunodeficiency virus [HIV] infection status: Secondary | ICD-10-CM | POA: Insufficient documentation

## 2021-07-24 DIAGNOSIS — R531 Weakness: Secondary | ICD-10-CM | POA: Insufficient documentation

## 2021-07-24 DIAGNOSIS — F10920 Alcohol use, unspecified with intoxication, uncomplicated: Secondary | ICD-10-CM

## 2021-07-24 DIAGNOSIS — F332 Major depressive disorder, recurrent severe without psychotic features: Secondary | ICD-10-CM | POA: Insufficient documentation

## 2021-07-24 LAB — CBC WITH DIFFERENTIAL/PLATELET
Abs Immature Granulocytes: 0 10*3/uL (ref 0.00–0.07)
Basophils Absolute: 0 10*3/uL (ref 0.0–0.1)
Basophils Relative: 1 %
Eosinophils Absolute: 0.1 10*3/uL (ref 0.0–0.5)
Eosinophils Relative: 4 %
HCT: 40.3 % (ref 39.0–52.0)
Hemoglobin: 13.5 g/dL (ref 13.0–17.0)
Immature Granulocytes: 0 %
Lymphocytes Relative: 49 %
Lymphs Abs: 1.4 10*3/uL (ref 0.7–4.0)
MCH: 33.8 pg (ref 26.0–34.0)
MCHC: 33.5 g/dL (ref 30.0–36.0)
MCV: 100.8 fL — ABNORMAL HIGH (ref 80.0–100.0)
Monocytes Absolute: 0.2 10*3/uL (ref 0.1–1.0)
Monocytes Relative: 9 %
Neutro Abs: 1 10*3/uL — ABNORMAL LOW (ref 1.7–7.7)
Neutrophils Relative %: 37 %
Platelets: 180 10*3/uL (ref 150–400)
RBC: 4 MIL/uL — ABNORMAL LOW (ref 4.22–5.81)
RDW: 17.5 % — ABNORMAL HIGH (ref 11.5–15.5)
WBC: 2.8 10*3/uL — ABNORMAL LOW (ref 4.0–10.5)
nRBC: 0 % (ref 0.0–0.2)

## 2021-07-24 LAB — COMPREHENSIVE METABOLIC PANEL
ALT: 27 U/L (ref 0–44)
AST: 41 U/L (ref 15–41)
Albumin: 4.2 g/dL (ref 3.5–5.0)
Alkaline Phosphatase: 75 U/L (ref 38–126)
Anion gap: 12 (ref 5–15)
BUN: 5 mg/dL — ABNORMAL LOW (ref 6–20)
CO2: 26 mmol/L (ref 22–32)
Calcium: 9 mg/dL (ref 8.9–10.3)
Chloride: 101 mmol/L (ref 98–111)
Creatinine, Ser: 0.64 mg/dL (ref 0.61–1.24)
GFR, Estimated: >60 mL/min (ref >60)
Glucose, Bld: 98 mg/dL (ref 70–99)
Potassium: 3.7 mmol/L (ref 3.5–5.1)
Sodium: 139 mmol/L (ref 135–145)
Total Bilirubin: 1 mg/dL (ref 0.3–1.2)
Total Protein: 7.3 g/dL (ref 6.5–8.1)

## 2021-07-24 LAB — RAPID URINE DRUG SCREEN, HOSP PERFORMED
Amphetamines: NOT DETECTED
Barbiturates: NOT DETECTED
Benzodiazepines: POSITIVE — AB
Cocaine: NOT DETECTED
Opiates: NOT DETECTED
Tetrahydrocannabinol: NOT DETECTED

## 2021-07-24 LAB — RESP PANEL BY RT-PCR (FLU A&B, COVID) ARPGX2
Influenza A by PCR: NEGATIVE
Influenza B by PCR: NEGATIVE
SARS Coronavirus 2 by RT PCR: NEGATIVE

## 2021-07-24 LAB — SALICYLATE LEVEL: Salicylate Lvl: <7 mg/dL — ABNORMAL LOW (ref 7.0–30.0)

## 2021-07-24 LAB — ETHANOL: Alcohol, Ethyl (B): 231 mg/dL — ABNORMAL HIGH (ref <10)

## 2021-07-24 LAB — ACETAMINOPHEN LEVEL: Acetaminophen (Tylenol), Serum: <10 ug/mL — ABNORMAL LOW (ref 10–30)

## 2021-07-24 IMAGING — CT CT HEAD W/O CM
3 series · 15 of 47 positions shown, 18 images · non-contrast
Comparison: CT brain [DATE]

CLINICAL DATA: Drinking weakness

EXAM:
CT HEAD WITHOUT CONTRAST
TECHNIQUE: Contiguous axial images were obtained from the base of the skull
through the vertex without intravenous contrast.

[Series 2: head wo · axial · 0.52mm/px · z∈[-128,+17]mm · 9 of 35 slices shown, 12 images]
[im 3/35  brain]
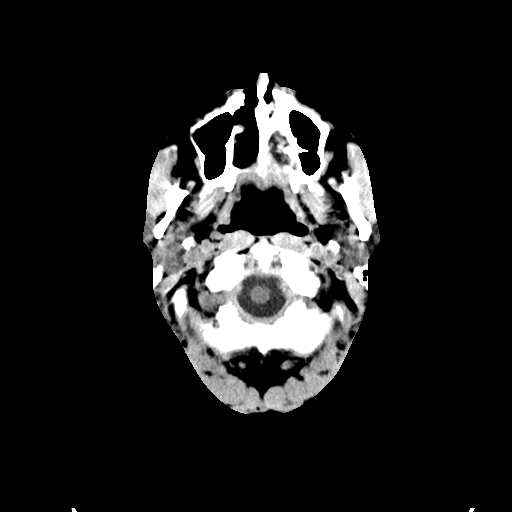
[im 3/35  bone]
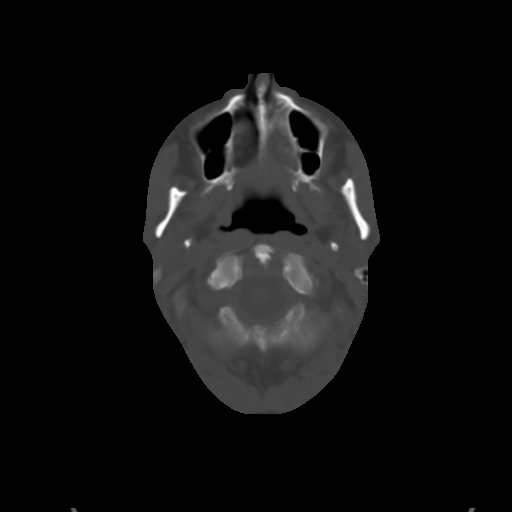
[im 6/35  brain]
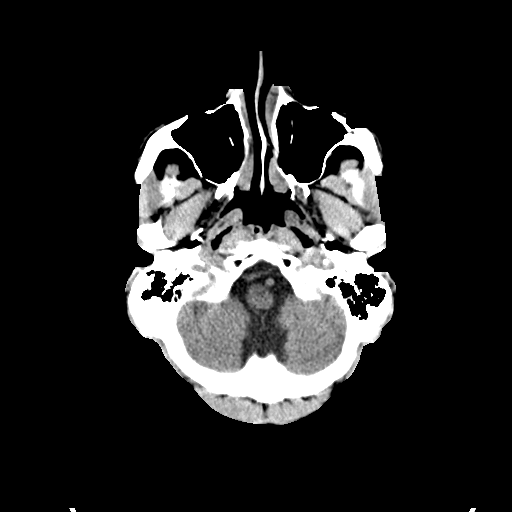
[im 10/35  brain]
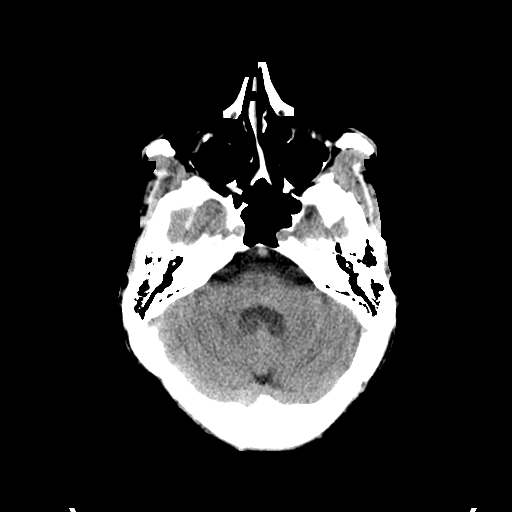
[im 13/35  brain]
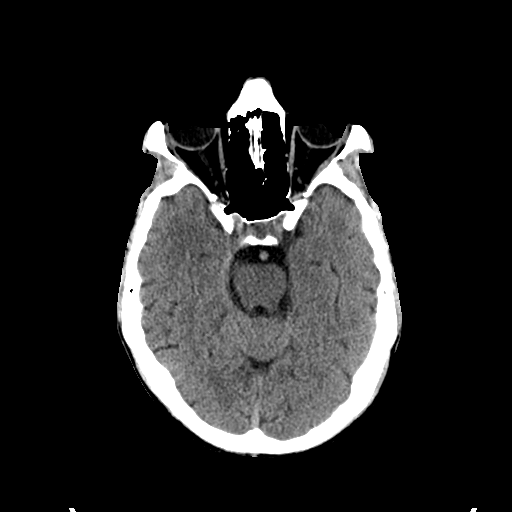
[im 18/35  brain]
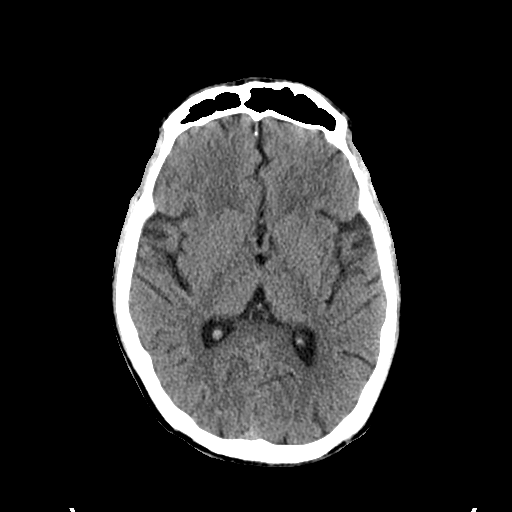
[im 18/35  bone]
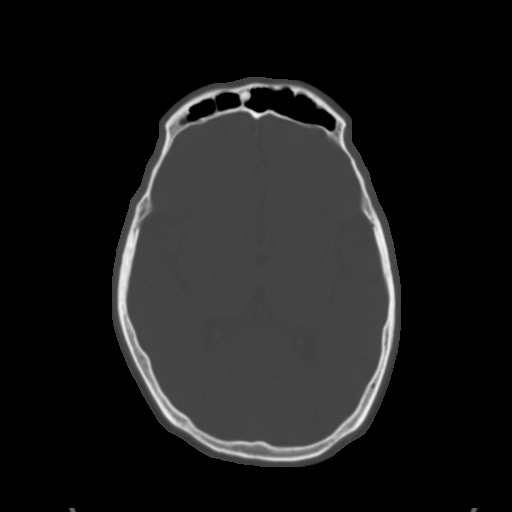
[im 22/35  brain]
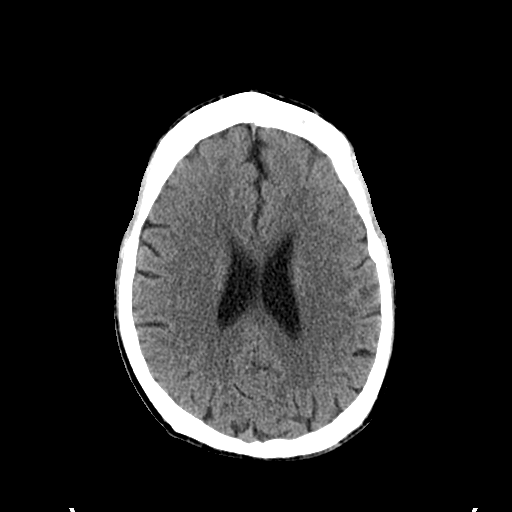
[im 25/35  brain]
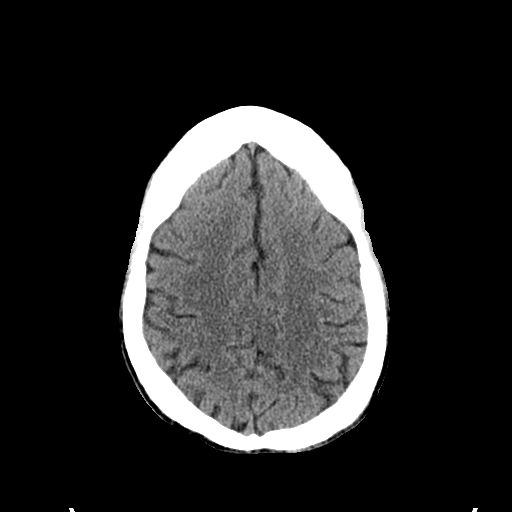
[im 29/35  brain]
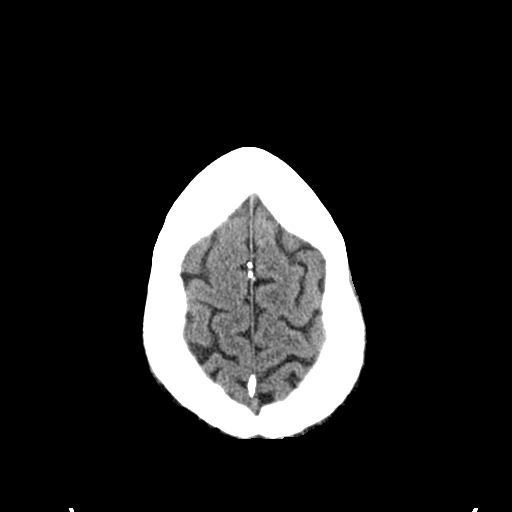
[im 32/35  brain]
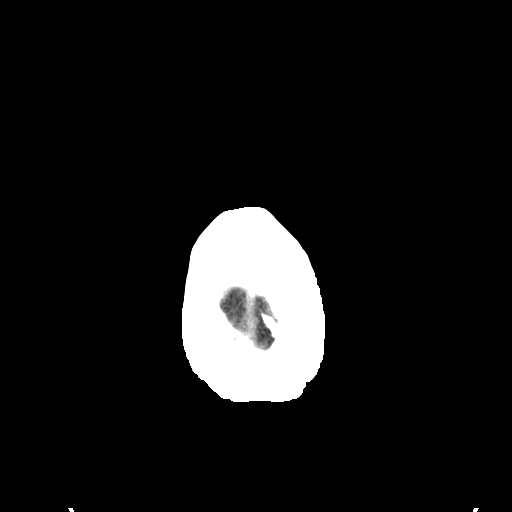
[im 32/35  bone]
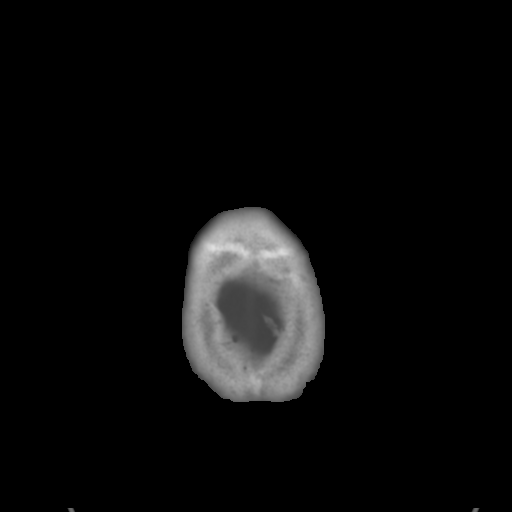

[Series 4: coronal soft tissue · coronal · 0.35mm/px · 3 of 70 slices shown]
[im 24/70  brain]
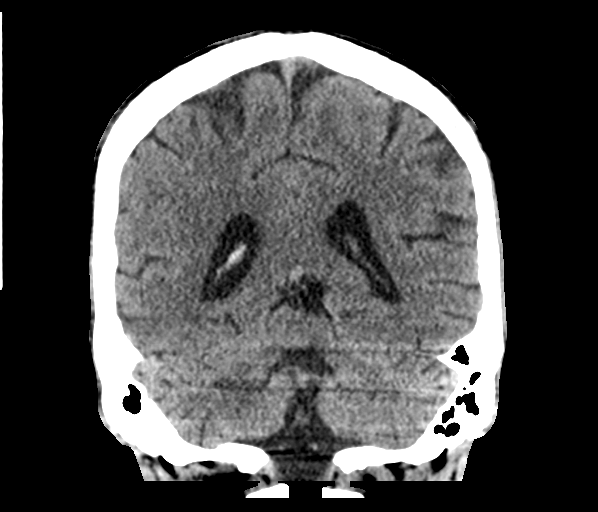
[im 31/70  brain]
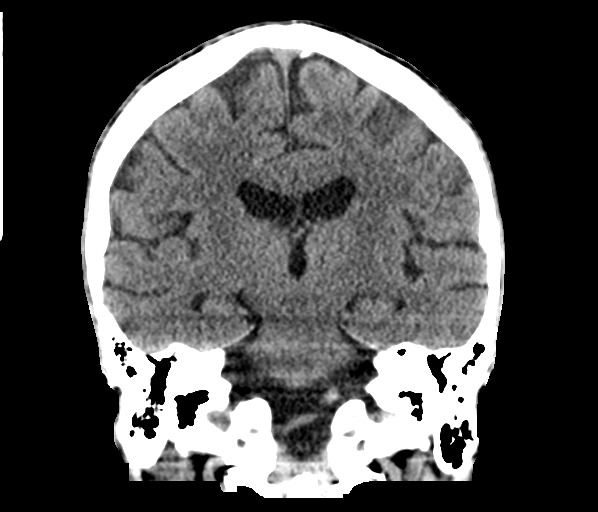
[im 39/70  brain]
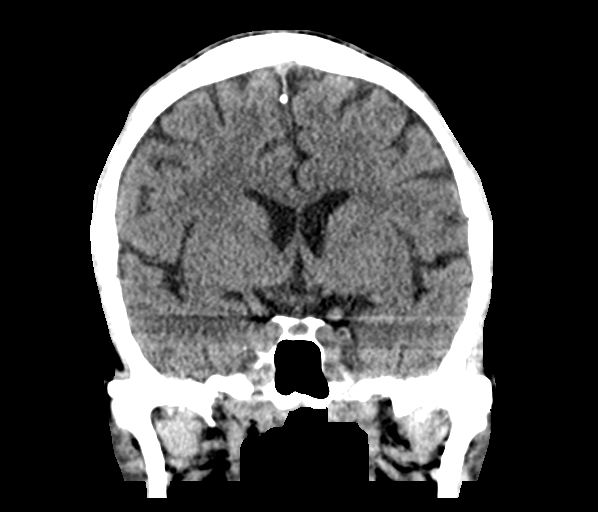

[Series 5: sagittal soft tissue · sagittal · 0.36mm/px · 3 of 53 slices shown]
[im 18/53  brain]
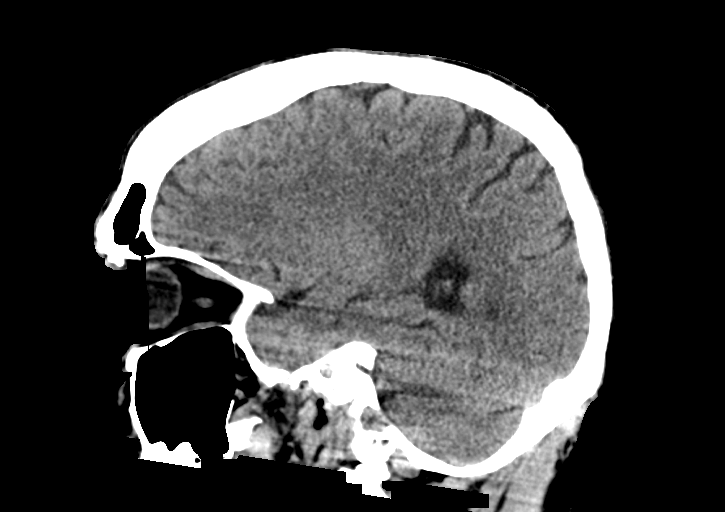
[im 27/53  brain]
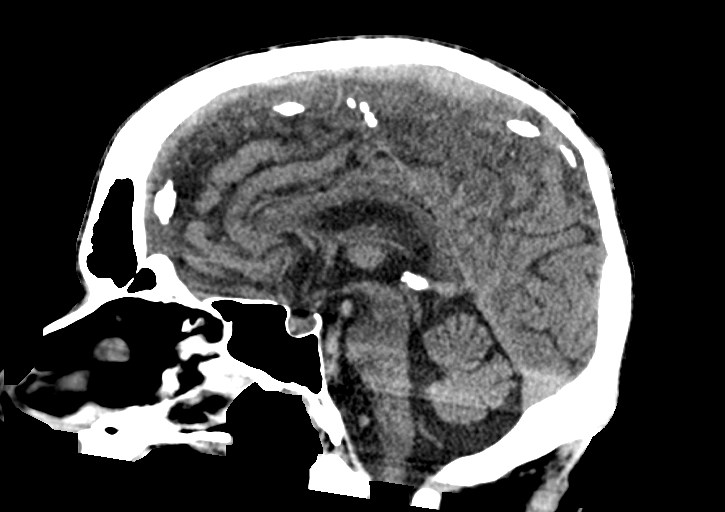
[im 35/53  brain]
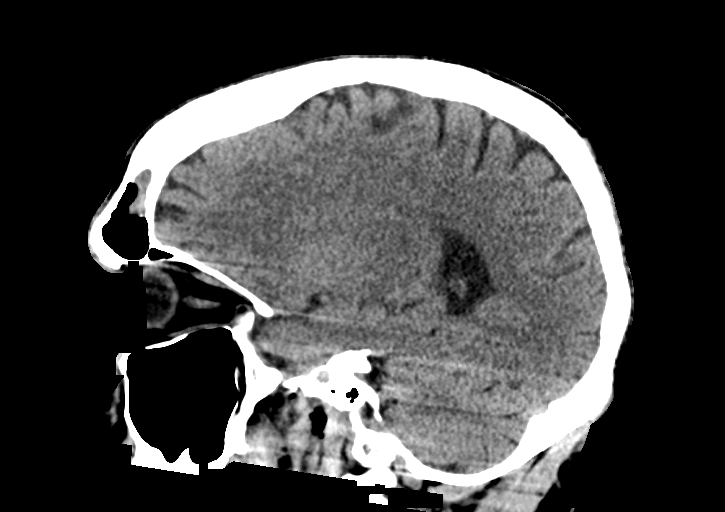

[15 of 47 positions shown; findings below may reference images not displayed]

FINDINGS: Brain: No evidence of acute infarction, hemorrhage, hydrocephalus,
extra-axial collection or mass lesion/mass effect.

Vascular: No hyperdense vessel or unexpected calcification.

Skull: Normal. Negative for fracture or focal lesion.

Sinuses/Orbits: No acute finding.

Other: None
IMPRESSION: Negative non contrasted CT appearance of the brain

## 2021-07-24 MED ORDER — LORAZEPAM 2 MG/ML IJ SOLN
2.0000 mg | Freq: Once | INTRAMUSCULAR | Status: AC
  Administered 2021-07-24: 2 mg via INTRAVENOUS

## 2021-07-24 MED ORDER — LORAZEPAM 2 MG/ML IJ SOLN: 0.0000 mg | Freq: Two times a day (BID) | INTRAMUSCULAR | Status: DC

## 2021-07-24 MED ORDER — LORAZEPAM 1 MG PO TABS: 0.0000 mg | ORAL_TABLET | Freq: Two times a day (BID) | ORAL | Status: DC

## 2021-07-24 MED ORDER — LORAZEPAM 2 MG/ML IJ SOLN
1.0000 mg | Freq: Once | INTRAMUSCULAR | Status: DC
  Filled 2021-07-24: qty 1

## 2021-07-24 MED ORDER — LORAZEPAM 1 MG PO TABS
0.0000 mg | ORAL_TABLET | Freq: Four times a day (QID) | ORAL | Status: DC
  Administered 2021-07-24 – 2021-07-25 (×3): 1 mg via ORAL
  Filled 2021-07-24 (×3): qty 1

## 2021-07-24 MED ORDER — LORAZEPAM 2 MG/ML IJ SOLN
0.0000 mg | Freq: Four times a day (QID) | INTRAMUSCULAR | Status: DC
  Administered 2021-07-25: 1 mg via INTRAVENOUS
  Filled 2021-07-24: qty 1

## 2021-07-24 NOTE — ED Triage Notes (Signed)
Pt to [facility] by EMS. Pt called EMS stating SI. Pt states to weak to commit suicide. Pt states has been drinking .

## 2021-07-24 NOTE — ED Notes (Signed)
Pt states left side is numb and painful md aware

## 2021-07-24 NOTE — ED Provider Notes (Signed)
[facility]-EMERGENCY DEPT Provider Note   CSN: [id number] Arrival date & time: 07/24/21  1559    History Chief Complaint  Patient presents with   Suicidal    Gary [name] is a 35 y.o. male with past medical history significant for alcohol abuse, HIV, seizures who presents for evaluation of generalized weakness and alcohol use.  Patient states he does not want to continue living.  States he drinks 7-8 "large" bottles of wine daily.  Last drink prior to arrival.  States once he "sobers up" that he plans on using a sharp object to commit suicide.  States he is homeless and has nothing to live for.  He feels depressed.  He denies any recent head trauma, headache, numbness, chest pain, shortness of breath, abdominal pain, emesis, melena.  Denies additional rating or alleviating factors.  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   Malnutrition of moderate degree 05/07/2020   Gastrointestinal hemorrhage    Alcoholic hepatitis without ascites    Melena 05/05/2020   Alcoholic ketoacidosis 05/05/2020   Thrombocytopenia (HCC) 07/25/2019   Transaminitis 07/25/2019   Traumatic subdural hematoma (HCC) 07/02/2019   Alcohol abuse with intoxication (HCC) 07/02/2019   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   Alcohol use disorder, severe, dependence (HCC) 04/16/2019   Major depressive disorder,  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    Past Surgical History:  Procedure Laterality Date   INCISION AND DRAINAGE PERIRECTAL ABSCESS N/A 09/24/2016   Procedure: IRRIGATION AND DEBRIDEMENT PERIRECTAL ABSCESS;  Surgeon: Ricarda Frame, MD;  Location: [facility] ORS;  Service: General;  Laterality: N/A;   none         Family History  Problem Relation Age of Onset   Alcohol abuse Mother    Alcohol abuse Father     Social History   Tobacco Use   Smoking status: Every Day    Packs/day: 0.50    Types: Cigarettes   Smokeless tobacco: Never   Tobacco comments:    unable to smoke while incarcerated 6+ months 02/13/20  Vaping Use   Vaping Use: Never used  Substance Use Topics   Alcohol use: Yes    Comment: "I drink all I can"   Drug use: Not Currently    Types: Marijuana, Methamphetamines    Comment: last used 04/10/2019    Home Medications Prior to Admission medications   Medication Sig Start Date End Date Taking? Authorizing Provider  bictegravir-emtricitabine-tenofovir AF (BIKTARVY) 50-200-25 MG TABS tablet Take 1 tablet by mouth daily. 05/13/21   Kuppelweiser, Cassie L, RPH-CPP  chlordiazePOXIDE (LIBRIUM) 25 MG capsule 50mg  PO TID x 1D, then 25-50mg  PO BID X 1D, then 25-50mg  PO QD X 1D Patient not taking: No sig reported 07/04/21   Petrucelli,  Samantha R, PA-C  ondansetron (ZOFRAN ODT) 4 MG disintegrating tablet Take 1 tablet (4 mg total) by mouth every 8 (eight) hours as needed for nausea or vomiting. Patient not taking: Reported on 07/11/2021 06/29/21   Maia Plan, MD  pantoprazole (PROTONIX) 20 MG tablet Take 1 tablet (20 mg total) by mouth daily. 07/21/21   Muthersbaugh, Dahlia Client, PA-C  famotidine (PEPCID) 20 MG tablet Take 1 tablet (20 mg total) by mouth 2 (two) times daily. Patient not taking: Reported on 06/01/2019 05/14/19 06/02/19  Benjiman Core, MD     Allergies    Tegretol [carbamazepine] and Caffeine  Review of Systems   Review of Systems  Unable to perform ROS: Mental status change (Intoxication)  Constitutional: Negative.   All other systems reviewed and are negative.  Physical Exam Updated Vital Signs BP 111/83 (BP Location: Right Arm)   Pulse 96   Temp 97.7 F (36.5 C) (Oral)   Resp 20   SpO2 98%   Physical Exam Vitals and nursing note reviewed.  Constitutional:      General: He is not in acute distress.    Appearance: He is well-developed. He is not ill-appearing, toxic-appearing or diaphoretic.  HENT:     Head: Normocephalic and atraumatic.  Eyes:     Pupils: Pupils are equal, round, and reactive to light.  Cardiovascular:     Rate and Rhythm: Normal rate and regular rhythm.     Pulses: Normal pulses.     Heart sounds: Normal heart sounds.  Pulmonary:     Effort: Pulmonary effort is normal. No respiratory distress.     Breath sounds: Normal breath sounds.  Abdominal:     General: Bowel sounds are normal. There is no distension.     Palpations: Abdomen is soft.  Musculoskeletal:        General: No swelling, tenderness, deformity or signs of injury. Normal range of motion.     Cervical back: Normal range of motion and neck supple.     Right lower leg: No edema.     Left lower leg: No edema.  Skin:    General: Skin is warm and dry.     Capillary Refill: Capillary refill takes less than 2 seconds.  Neurological:     Comments: Sleepy, arousal to voice No tremors Follow commands  CN 2-12 grossly intact Ambulatory here Neg heel to shin Equal strength Intact sensation    ED Results / Procedures / Treatments   Labs (all labs ordered are listed, but only abnormal results are displayed) Labs Reviewed  CBC WITH DIFFERENTIAL/PLATELET - Abnormal; Notable for the following components:      Result Value   WBC 2.8 (*)    RBC 4.00 (*)    MCV 100.8 (*)    RDW 17.5 (*)    Neutro Abs 1.0 (*)    All other  components within normal limits  COMPREHENSIVE METABOLIC PANEL - Abnormal; Notable for the following components:   BUN 5 (*)    All other components within normal limits  ETHANOL - Abnormal; Notable for the following components:   Alcohol, Ethyl (B) 231 (*)    All other components within normal limits  SALICYLATE LEVEL - Abnormal; Notable for the following components:   Salicylate Lvl <7.0 (*)    All other components within normal limits  ACETAMINOPHEN LEVEL - Abnormal; Notable for the following components:   Acetaminophen (Tylenol), Serum <10 (*)    All other components within normal limits  RAPID URINE DRUG  SCREEN, HOSP PERFORMED - Abnormal; Notable for the following components:   Benzodiazepines POSITIVE (*)    All other components within normal limits  RESP PANEL BY RT-PCR (FLU A&B, COVID) ARPGX2    EKG None  Radiology CT HEAD WO CONTRAST ( )  Result Date: 07/24/2021 CLINICAL DATA:  Drinking weakness EXAM: CT HEAD WITHOUT CONTRAST TECHNIQUE: Contiguous axial images were obtained from the base of the skull through the vertex without intravenous contrast. COMPARISON:  CT brain 04/30/1999 FINDINGS: Brain: No evidence of acute infarction, hemorrhage, hydrocephalus, extra-axial collection or mass lesion/mass effect. Vascular: No hyperdense vessel or unexpected calcification. Skull: Normal. Negative for fracture or focal lesion. Sinuses/Orbits: No acute finding. Other: None IMPRESSION: Negative non contrasted CT appearance of the brain Electronically Signed   By: Jasmine Pang M.D.   On: 07/24/2021 21:24    Procedures Procedures   Medications Ordered in ED Medications  LORazepam (ATIVAN) injection 0-4 mg (has no administration in time range)    Or  LORazepam (ATIVAN) tablet 0-4 mg (has no administration in time range)  LORazepam (ATIVAN) injection 0-4 mg (has no administration in time range)    Or  LORazepam (ATIVAN) tablet 0-4 mg (has no administration in time range)  LORazepam  (ATIVAN) injection 2 mg (2 mg Intravenous Given 07/24/21 2027)   ED Course  I have reviewed the triage vital signs and the nursing notes.  Pertinent labs & imaging results that were available during my care of the patient were reviewed by me and considered in my medical decision making (see chart for details).  Patient well-known to the emergency department presents for evaluation of alcohol intoxication and suicidal ideation.  He appears intoxicated at this time, states he last drink just prior to arrival.  No drug use.  No chest pain, shortness of breath, traumatic injury.  Admits to SI with plan to use a sharp object to harm self.  He denies AVH, HI.  Plan on labs, sober, TTS consult.  Patient reassessed.  He is more arousable.  Patient states entire body has been painful and "tingling" left>right over the last 3 days.  He denies any recent traumatic injuries.  On reassessment patient is tremulous.   Unfortunately nursing earlier had not obtain labs.  We will plan on Ativan, CIWA.  CT head and reassess.  Labs and imaging personally reviewed and interpreted:  CBC WBC 2.8 CMP without acute abnormality Salicylate, acetaminophen without acute abnormality UDS positive for Benzo Ethanol 231 CT head without acute findings>>Patient now states his symptoms have been present x months. Low suspicion for CVA as cause of pain and tingling. States started after being tazzed by police months ago.  Patient reassessed. Feels significantly improved after Ativan. Tolerating PO intake. Repeat CIWA improved 11>>4.   Patient medically cleared. Hold orders placed.  Still endorsing SI. Dispo per psychiatry    MDM Rules/Calculators/A&P                            Final Clinical Impression(s) / ED Diagnoses Final diagnoses:  Suicidal thoughts  Alcoholic intoxication without complication [facility])    Rx / DC Orders ED Discharge Orders     None        Estiven Kohan A, PA-C 07/24/21 2312     Gloris Manchester, MD 07/25/21 1521

## 2021-07-25 ENCOUNTER — Emergency Department (HOSPITAL_COMMUNITY): Payer: Self-pay

## 2021-07-25 ENCOUNTER — Other Ambulatory Visit: Payer: Self-pay | Admitting: Psychiatry

## 2021-07-25 LAB — TROPONIN I (HIGH SENSITIVITY): Troponin I (High Sensitivity): 4 ng/L (ref <18)

## 2021-07-25 IMAGING — DX DG CHEST 1V PORT
2 series · 2 of 2 positions shown · non-contrast
Comparison: [DATE]

CLINICAL DATA: Chest pain

EXAM:
PORTABLE CHEST 1 VIEW

[chest ap (1 of 2)]
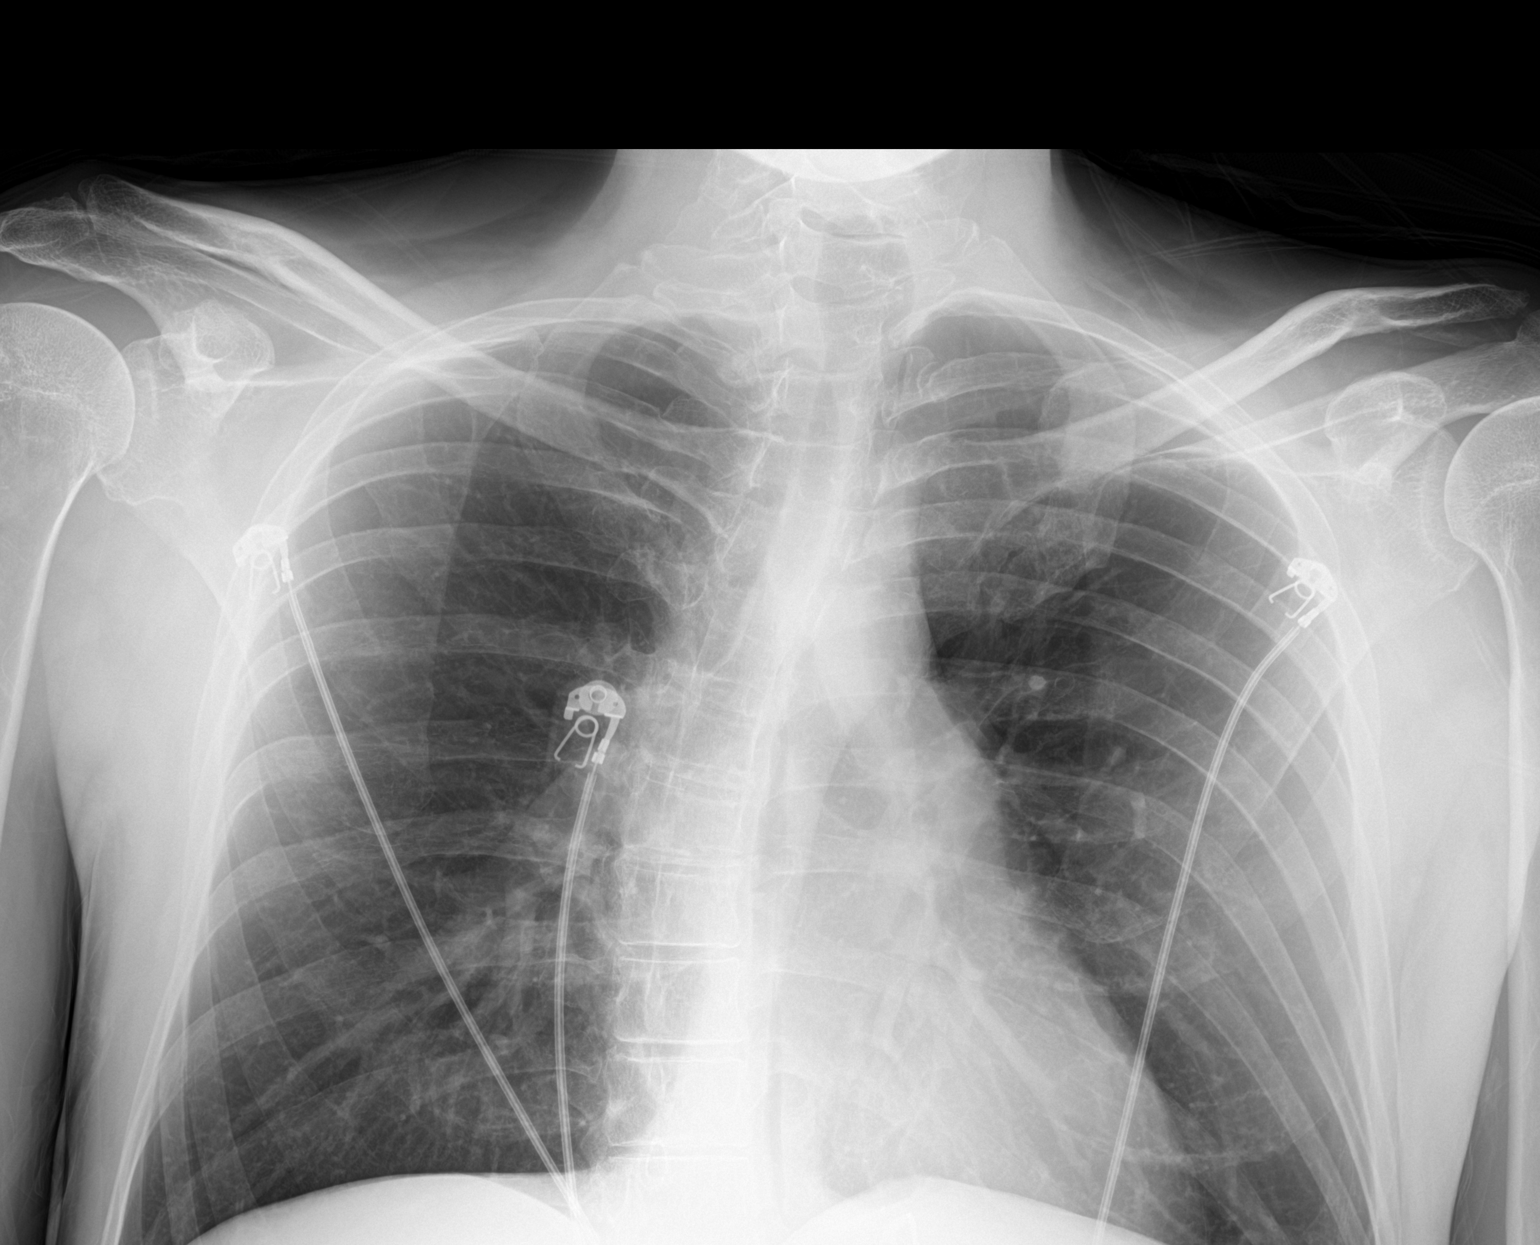

[chest ap (2 of 2)]
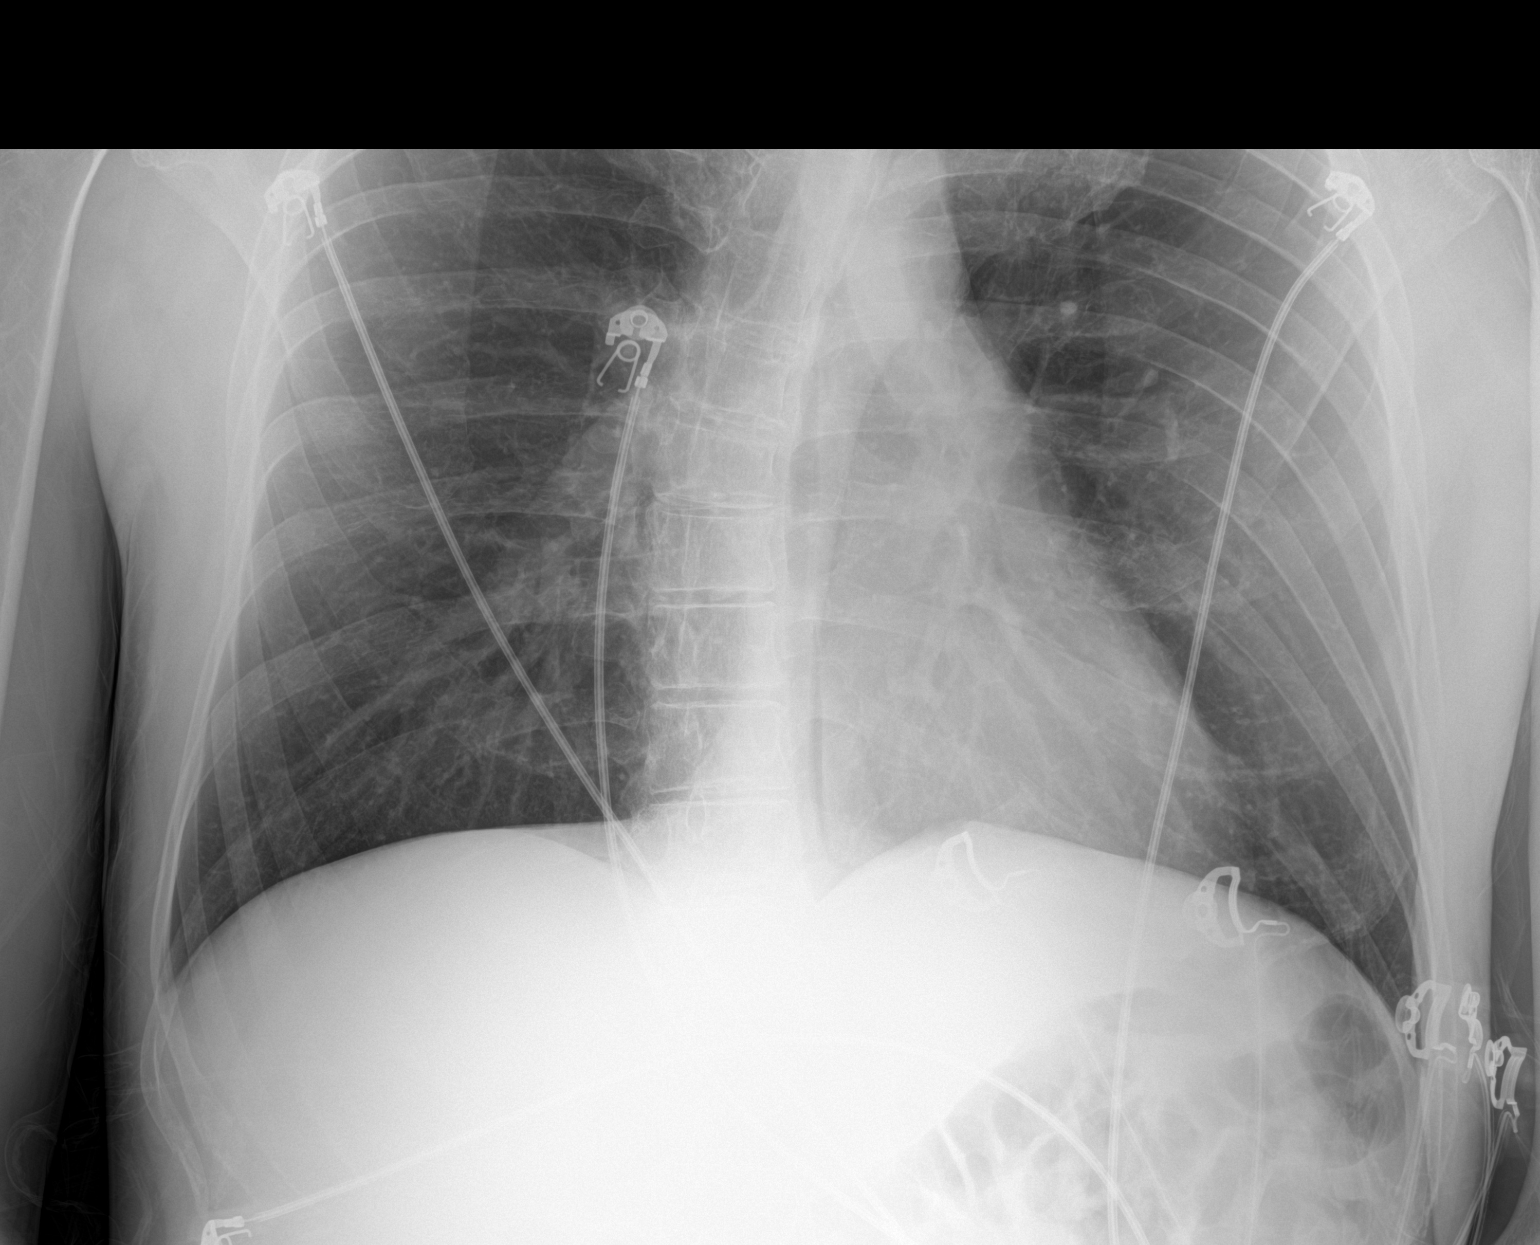

[2 of 2 positions shown; findings below may reference images not displayed]

FINDINGS: The lungs are clear without focal pneumonia, edema, pneumothorax or
pleural effusion. The cardiopericardial silhouette is within normal
limits for size. The visualized bony structures of the thorax show
no acute abnormality. Telemetry leads overlie the chest.
IMPRESSION: No active disease.

## 2021-07-25 MED ORDER — PROMETHAZINE HCL 25 MG PO TABS: 12.5000 mg | ORAL_TABLET | Freq: Once | ORAL | Status: DC

## 2021-07-25 MED ORDER — LORAZEPAM 1 MG PO TABS
1.0000 mg | ORAL_TABLET | Freq: Once | ORAL | Status: AC
  Administered 2021-07-25: 1 mg via ORAL
  Filled 2021-07-25: qty 1

## 2021-07-25 MED ORDER — LORAZEPAM 2 MG/ML IJ SOLN
2.0000 mg | Freq: Once | INTRAMUSCULAR | Status: AC
  Administered 2021-07-25: 2 mg via INTRAVENOUS
  Filled 2021-07-25: qty 1

## 2021-07-25 NOTE — ED Notes (Signed)
Report called to [name], RN at [facility]

## 2021-07-25 NOTE — ED Provider Notes (Addendum)
Emergency Medicine Observation Re-evaluation Note  Gary [name] is a 35 y.o. male, seen on rounds today.  Pt initially presented to the ED for complaints of Suicidal Currently, the patient is complaining of chest pain as well as pain throughout his entire body.  He feels like it is a burning sensation.  He is also having persistent tremors.  Pain in his chest is sharp.  He states this has been going on the entire time he has been here in the emergency department.  No fevers or chills.  Records were reviewed and patient was evaluated earlier this morning about 4 hours ago for the same complaints.  Physical Exam  BP 107/75 (BP Location: Right Arm)   Pulse 77   Temp 97.6 F (36.4 C) (Oral)   Resp 18   SpO2 98%  Physical Exam General: alert Cardiac: Tachycardic no murmurs Lungs: Clear to auscultation Psych: Flat affect, depressed mood  ED Course / MDM  EKG:EKG Interpretation  Date/Time:  Friday July 25 2021 03:16:34 EDT Ventricular Rate:  88 PR Interval:  138 QRS Duration: 94 QT Interval:  384 QTC Calculation: 464 R Axis:   80 Text Interpretation: Normal sinus rhythm T wave abnormality Prolonged QT Abnormal ECG Confirmed by Zadie Rhine ([id number]) on 07/25/2021 3:26:43 AM Also confirmed by Zadie Rhine ([id number]), editor Dareen Piano, Tamera Punt [phone])  on 07/25/2021 6:57:37 AM  EKG Interpretation  Date/Time:  Friday July 25 2021 08:02:36 EDT Ventricular Rate:  86 PR Interval:  133 QRS Duration: 101 QT Interval:  404 QTC Calculation: 484 R Axis:   76 Text Interpretation: Sinus rhythm RSR' in V1 or V2, right VCD or RVH Borderline prolonged QT interval No significant change since last tracing Confirmed by Linwood Dibbles [phone]) on 07/25/2021 8:16:30 AM        I have reviewed the labs performed to date as well as medications administered while in observation.  Recent changes in the last 24 hours include initial laboratory testing evaluation.  His initial alcohol level was  elevated.  Plan  Current plan is for: check cardiac enzymes and chest x-ray.  Continue with CIWA protocol.  Ativan ordered.  DAYLIN GRUSZKA is not under involuntary commitment.     Linwood Dibbles, MD 07/25/21 [phone] Cardiac eval was reassuring.  No signs of severe DTs or alcohol withdrawal.  Patient is medically cleared   Linwood Dibbles, MD 07/25/21 1157

## 2021-07-25 NOTE — ED Provider Notes (Signed)
On exam, pt with increasing tremors in all extremities He has reproducible CP/ABD pain Distal pulses intact, he moves all extremities x4 Awaiting psych placement    Zadie Rhine, MD 07/25/21 [id number]

## 2021-07-25 NOTE — ED Notes (Signed)
Pt began to c/o substernal CP, weakness, dizziness and increase in tremors.  Pt stating, "I don't feel right."  D/w Dr. Lynelle Doctor who asked for a repeat EKG.  D/w CN, pt moved back to main ED for further evaluation.  EKG preformed and given to Dr. Lynelle Doctor.  Report given to Issac, RN as well as his zone partner.

## 2021-07-25 NOTE — Discharge Instructions (Signed)
For your behavioral health needs you are advised to follow up with [facility] at your earliest opportunity:       [facility]      [address]      [place], [place] [address]      [phone]  

## 2021-07-25 NOTE — ED Notes (Signed)
Attempted report to [facility]. Nurse to call back.

## 2021-07-25 NOTE — ED Notes (Signed)
Pt complaining of chest pain, md aware, EKG and vitals taken and all within normal limits

## 2021-07-25 NOTE — ED Notes (Addendum)
[facility] paged for pt pick-up and transport to [facility] at 1630 today as per requested by Malva Limes, RN, Edyth Gunnels. Kizzie Bane, Counselor, also aware.
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
## 2021-07-25 NOTE — ED Provider Notes (Signed)
BP 107/75 (BP Location: Right Arm)   Pulse 77   Temp 97.6 F (36.4 C) (Oral)   Resp 18   SpO2 98%  I was informed by nursing the patient is having increasing tremors and reporting chest pain.  Vitals are appropriate. Ativan has been ordered.  EKG does reveal some T wave abnormalities, but there is also artifact from tremor.  ED ECG REPORT   Date: 07/25/2021 0316  Rate: 88  Rhythm: normal sinus rhythm  QRS Axis: normal  Intervals: normal  ST/T Wave abnormalities: nonspecific T wave changes  Conduction Disutrbances:none   I have personally reviewed the EKG tracing and agree with the computerized printout as noted.    Zadie Rhine, MD 07/25/21 [phone]

## 2021-07-26 ENCOUNTER — Inpatient Hospital Stay (HOSPITAL_COMMUNITY)
Admission: AD | Admit: 2021-07-26 | Discharge: 2021-07-31 | DRG: 897 | Disposition: A | Discharge status: Home / Self Care | Payer: Federal, State, Local not specified - Other | Source: Intra-hospital | Attending: Emergency Medicine | Admitting: Emergency Medicine

## 2021-07-26 ENCOUNTER — Encounter (HOSPITAL_COMMUNITY): Payer: Self-pay | Admitting: Psychiatry

## 2021-07-26 ENCOUNTER — Other Ambulatory Visit: Payer: Self-pay

## 2021-07-26 DIAGNOSIS — F10239 Alcohol dependence with withdrawal, unspecified: Secondary | ICD-10-CM | POA: Diagnosis present

## 2021-07-26 DIAGNOSIS — B20 Human immunodeficiency virus [HIV] disease: Secondary | ICD-10-CM | POA: Diagnosis present

## 2021-07-26 DIAGNOSIS — R12 Heartburn: Secondary | ICD-10-CM | POA: Diagnosis present

## 2021-07-26 DIAGNOSIS — Y907 Blood alcohol level of 200-239 mg/100 ml: Secondary | ICD-10-CM | POA: Diagnosis present

## 2021-07-26 DIAGNOSIS — D709 Neutropenia, unspecified: Secondary | ICD-10-CM | POA: Diagnosis present

## 2021-07-26 DIAGNOSIS — F10282 Alcohol dependence with alcohol-induced sleep disorder: Secondary | ICD-10-CM | POA: Diagnosis present

## 2021-07-26 DIAGNOSIS — R45851 Suicidal ideations: Secondary | ICD-10-CM | POA: Diagnosis present

## 2021-07-26 DIAGNOSIS — E876 Hypokalemia: Secondary | ICD-10-CM | POA: Diagnosis present

## 2021-07-26 DIAGNOSIS — Z21 Asymptomatic human immunodeficiency virus [HIV] infection status: Secondary | ICD-10-CM | POA: Diagnosis present

## 2021-07-26 DIAGNOSIS — F1024 Alcohol dependence with alcohol-induced mood disorder: Principal | ICD-10-CM | POA: Diagnosis present

## 2021-07-26 DIAGNOSIS — K5900 Constipation, unspecified: Secondary | ICD-10-CM | POA: Diagnosis present

## 2021-07-26 DIAGNOSIS — F17210 Nicotine dependence, cigarettes, uncomplicated: Secondary | ICD-10-CM | POA: Diagnosis present

## 2021-07-26 DIAGNOSIS — F1094 Alcohol use, unspecified with alcohol-induced mood disorder: Secondary | ICD-10-CM | POA: Diagnosis not present

## 2021-07-26 DIAGNOSIS — D696 Thrombocytopenia, unspecified: Secondary | ICD-10-CM | POA: Diagnosis present

## 2021-07-26 DIAGNOSIS — F10982 Alcohol use, unspecified with alcohol-induced sleep disorder: Secondary | ICD-10-CM

## 2021-07-26 DIAGNOSIS — F1020 Alcohol dependence, uncomplicated: Secondary | ICD-10-CM | POA: Diagnosis present

## 2021-07-26 DIAGNOSIS — F332 Major depressive disorder, recurrent severe without psychotic features: Secondary | ICD-10-CM | POA: Insufficient documentation

## 2021-07-26 LAB — COMPREHENSIVE METABOLIC PANEL
ALT: 24 U/L (ref 0–44)
ALT: 23 U/L (ref 0–44)
AST: 34 U/L (ref 15–41)
AST: 37 U/L (ref 15–41)
Albumin: 4.1 g/dL (ref 3.5–5.0)
Albumin: 4.3 g/dL (ref 3.5–5.0)
Alkaline Phosphatase: 75 U/L (ref 38–126)
Alkaline Phosphatase: 74 U/L (ref 38–126)
Anion gap: 8 (ref 5–15)
Anion gap: 9 (ref 5–15)
BUN: 8 mg/dL (ref 6–20)
BUN: 9 mg/dL (ref 6–20)
CO2: 28 mmol/L (ref 22–32)
CO2: 28 mmol/L (ref 22–32)
Calcium: 9.6 mg/dL (ref 8.9–10.3)
Calcium: 9.9 mg/dL (ref 8.9–10.3)
Chloride: 99 mmol/L (ref 98–111)
Chloride: 101 mmol/L (ref 98–111)
Creatinine, Ser: 0.57 mg/dL — ABNORMAL LOW (ref 0.61–1.24)
Creatinine, Ser: 0.72 mg/dL (ref 0.61–1.24)
GFR, Estimated: >60 mL/min (ref >60)
GFR, Estimated: >60 mL/min (ref >60)
Glucose, Bld: 94 mg/dL (ref 70–99)
Glucose, Bld: 102 mg/dL — ABNORMAL HIGH (ref 70–99)
Potassium: 3.3 mmol/L — ABNORMAL LOW (ref 3.5–5.1)
Potassium: 3.5 mmol/L (ref 3.5–5.1)
Sodium: 135 mmol/L (ref 135–145)
Sodium: 138 mmol/L (ref 135–145)
Total Bilirubin: 1.7 mg/dL — ABNORMAL HIGH (ref 0.3–1.2)
Total Bilirubin: 1.6 mg/dL — ABNORMAL HIGH (ref 0.3–1.2)
Total Protein: 7.4 g/dL (ref 6.5–8.1)
Total Protein: 7.6 g/dL (ref 6.5–8.1)

## 2021-07-26 LAB — CBC WITH DIFFERENTIAL/PLATELET
Abs Immature Granulocytes: 0.01 10*3/uL (ref 0.00–0.07)
Basophils Absolute: 0 10*3/uL (ref 0.0–0.1)
Basophils Relative: 1 %
Eosinophils Absolute: 0.1 10*3/uL (ref 0.0–0.5)
Eosinophils Relative: 3 %
HCT: 41.2 % (ref 39.0–52.0)
Hemoglobin: 13.7 g/dL (ref 13.0–17.0)
Immature Granulocytes: 0 %
Lymphocytes Relative: 22 %
Lymphs Abs: 0.7 10*3/uL (ref 0.7–4.0)
MCH: 34 pg (ref 26.0–34.0)
MCHC: 33.3 g/dL (ref 30.0–36.0)
MCV: 102.2 fL — ABNORMAL HIGH (ref 80.0–100.0)
Monocytes Absolute: 0.2 10*3/uL (ref 0.1–1.0)
Monocytes Relative: 6 %
Neutro Abs: 2.1 10*3/uL (ref 1.7–7.7)
Neutrophils Relative %: 68 %
Platelets: 133 10*3/uL — ABNORMAL LOW (ref 150–400)
RBC: 4.03 MIL/uL — ABNORMAL LOW (ref 4.22–5.81)
RDW: 17 % — ABNORMAL HIGH (ref 11.5–15.5)
WBC: 3.1 10*3/uL — ABNORMAL LOW (ref 4.0–10.5)
nRBC: 0 % (ref 0.0–0.2)

## 2021-07-26 LAB — HEMOGLOBIN A1C
Hgb A1c MFr Bld: 4.3 % — ABNORMAL LOW (ref 4.8–5.6)
Mean Plasma Glucose: 76.71 mg/dL

## 2021-07-26 LAB — CBC
HCT: 40.3 % (ref 39.0–52.0)
Hemoglobin: 13.7 g/dL (ref 13.0–17.0)
MCH: 34.8 pg — ABNORMAL HIGH (ref 26.0–34.0)
MCHC: 34 g/dL (ref 30.0–36.0)
MCV: 102.3 fL — ABNORMAL HIGH (ref 80.0–100.0)
Platelets: 143 10*3/uL — ABNORMAL LOW (ref 150–400)
RBC: 3.94 MIL/uL — ABNORMAL LOW (ref 4.22–5.81)
RDW: 17 % — ABNORMAL HIGH (ref 11.5–15.5)
WBC: 2.7 10*3/uL — ABNORMAL LOW (ref 4.0–10.5)
nRBC: 0 % (ref 0.0–0.2)

## 2021-07-26 LAB — HEPATIC FUNCTION PANEL
ALT: 24 U/L (ref 0–44)
AST: 35 U/L (ref 15–41)
Albumin: 4 g/dL (ref 3.5–5.0)
Alkaline Phosphatase: 75 U/L (ref 38–126)
Bilirubin, Direct: 0.3 mg/dL — ABNORMAL HIGH (ref 0.0–0.2)
Indirect Bilirubin: 1.4 mg/dL — ABNORMAL HIGH (ref 0.3–0.9)
Total Bilirubin: 1.7 mg/dL — ABNORMAL HIGH (ref 0.3–1.2)
Total Protein: 7.5 g/dL (ref 6.5–8.1)

## 2021-07-26 LAB — LIPID PANEL
Cholesterol: 198 mg/dL (ref 0–200)
HDL: 52 mg/dL (ref >40)
LDL Cholesterol: 104 mg/dL — ABNORMAL HIGH (ref 0–99)
Total CHOL/HDL Ratio: 3.8 RATIO
Triglycerides: 209 mg/dL — ABNORMAL HIGH (ref <150)
VLDL: 42 mg/dL — ABNORMAL HIGH (ref 0–40)

## 2021-07-26 LAB — TSH: TSH: 2.079 u[IU]/mL (ref 0.350–4.500)

## 2021-07-26 LAB — MAGNESIUM: Magnesium: 1.6 mg/dL — ABNORMAL LOW (ref 1.7–2.4)

## 2021-07-26 MED ORDER — LORAZEPAM 1 MG PO TABS
1.0000 mg | ORAL_TABLET | Freq: Four times a day (QID) | ORAL | Status: DC | PRN
  Administered 2021-07-26 – 2021-07-28 (×2): 1 mg via ORAL
  Filled 2021-07-26 (×3): qty 1

## 2021-07-26 MED ORDER — LORAZEPAM 1 MG PO TABS
1.0000 mg | ORAL_TABLET | Freq: Two times a day (BID) | ORAL | Status: AC
Start: 2021-07-28 — End: 2021-07-28
  Administered 2021-07-28 (×2): 1 mg via ORAL
  Filled 2021-07-26 (×2): qty 1

## 2021-07-26 MED ORDER — THIAMINE HCL 100 MG PO TABS
100.0000 mg | ORAL_TABLET | Freq: Every day | ORAL | Status: DC
  Administered 2021-07-27 – 2021-07-31 (×5): 100 mg via ORAL
  Filled 2021-07-26 (×2): qty 1
  Filled 2021-07-26: qty 7
  Filled 2021-07-26: qty 1
  Filled 2021-07-26: qty 7
  Filled 2021-07-26 (×3): qty 1

## 2021-07-26 MED ORDER — ONDANSETRON 4 MG PO TBDP
4.0000 mg | ORAL_TABLET | Freq: Four times a day (QID) | ORAL | Status: DC | PRN
  Administered 2021-07-26 – 2021-07-28 (×3): 4 mg via ORAL
  Filled 2021-07-26 (×5): qty 1

## 2021-07-26 MED ORDER — GABAPENTIN 100 MG PO CAPS
100.0000 mg | ORAL_CAPSULE | Freq: Three times a day (TID) | ORAL | Status: DC
  Administered 2021-07-26 – 2021-07-28 (×7): 100 mg via ORAL
  Filled 2021-07-26 (×2): qty 1
  Filled 2021-07-26: qty 21
  Filled 2021-07-26: qty 1
  Filled 2021-07-26 (×2): qty 21
  Filled 2021-07-26 (×8): qty 1

## 2021-07-26 MED ORDER — LORAZEPAM 1 MG PO TABS
1.0000 mg | ORAL_TABLET | Freq: Four times a day (QID) | ORAL | Status: AC
  Administered 2021-07-26 (×4): 1 mg via ORAL
  Filled 2021-07-26 (×3): qty 1

## 2021-07-26 MED ORDER — BICTEGRAVIR-EMTRICITAB-TENOFOV 50-200-25 MG PO TABS
1.0000 | ORAL_TABLET | Freq: Every day | ORAL | Status: DC
  Administered 2021-07-26 – 2021-07-31 (×6): 1 via ORAL
  Filled 2021-07-26 (×7): qty 1

## 2021-07-26 MED ORDER — LORAZEPAM 1 MG PO TABS
1.0000 mg | ORAL_TABLET | Freq: Three times a day (TID) | ORAL | Status: AC
  Administered 2021-07-27 (×3): 1 mg via ORAL
  Filled 2021-07-26 (×3): qty 1

## 2021-07-26 MED ORDER — HYDROXYZINE HCL 25 MG PO TABS
25.0000 mg | ORAL_TABLET | Freq: Four times a day (QID) | ORAL | Status: AC | PRN
  Administered 2021-07-26 – 2021-07-27 (×3): 25 mg via ORAL
  Filled 2021-07-26: qty 1
  Filled 2021-07-26 (×2): qty 10
  Filled 2021-07-26 (×2): qty 1

## 2021-07-26 MED ORDER — FOLIC ACID 1 MG PO TABS
1.0000 mg | ORAL_TABLET | Freq: Every day | ORAL | Status: DC
  Administered 2021-07-27 – 2021-07-31 (×5): 1 mg via ORAL
  Filled 2021-07-26 (×2): qty 1
  Filled 2021-07-26: qty 7
  Filled 2021-07-26: qty 1
  Filled 2021-07-26: qty 7
  Filled 2021-07-26 (×4): qty 1

## 2021-07-26 MED ORDER — LORAZEPAM 1 MG PO TABS
1.0000 mg | ORAL_TABLET | Freq: Every day | ORAL | Status: AC
Start: 2021-07-29 — End: 2021-07-29
  Administered 2021-07-29: 1 mg via ORAL
  Filled 2021-07-26: qty 1

## 2021-07-26 MED ORDER — ENSURE ENLIVE PO LIQD
237.0000 mL | Freq: Two times a day (BID) | ORAL | Status: DC
  Administered 2021-07-27 – 2021-07-31 (×6): 237 mL via ORAL
  Filled 2021-07-26 (×15): qty 237

## 2021-07-26 MED ORDER — POTASSIUM CHLORIDE CRYS ER 20 MEQ PO TBCR
40.0000 meq | EXTENDED_RELEASE_TABLET | Freq: Once | ORAL | Status: DC
  Filled 2021-07-26: qty 2

## 2021-07-26 MED ORDER — THIAMINE HCL 100 MG PO TABS
100.0000 mg | ORAL_TABLET | Freq: Every day | ORAL | Status: DC
  Filled 2021-07-26: qty 1

## 2021-07-26 MED ORDER — PANTOPRAZOLE SODIUM 20 MG PO TBEC
20.0000 mg | DELAYED_RELEASE_TABLET | Freq: Two times a day (BID) | ORAL | Status: DC
  Administered 2021-07-26 – 2021-07-31 (×10): 20 mg via ORAL
  Filled 2021-07-26: qty 14
  Filled 2021-07-26 (×2): qty 1
  Filled 2021-07-26: qty 14
  Filled 2021-07-26: qty 1
  Filled 2021-07-26: qty 14
  Filled 2021-07-26 (×3): qty 1
  Filled 2021-07-26 (×3): qty 14
  Filled 2021-07-26: qty 1
  Filled 2021-07-26: qty 14
  Filled 2021-07-26: qty 1
  Filled 2021-07-26: qty 14
  Filled 2021-07-26: qty 1

## 2021-07-26 MED ORDER — LOPERAMIDE HCL 2 MG PO CAPS
2.0000 mg | ORAL_CAPSULE | ORAL | Status: AC | PRN
  Administered 2021-07-27: 2 mg via ORAL
  Filled 2021-07-26: qty 1

## 2021-07-26 MED ORDER — ADULT MULTIVITAMIN W/MINERALS CH
1.0000 | ORAL_TABLET | Freq: Every day | ORAL | Status: DC
Start: 2021-07-26 — End: 2021-07-31
  Administered 2021-07-27 – 2021-07-30 (×5): 1 via ORAL
  Filled 2021-07-26: qty 1
  Filled 2021-07-26: qty 7
  Filled 2021-07-26 (×6): qty 1
  Filled 2021-07-26: qty 7

## 2021-07-26 NOTE — [facility] Suicide Risk Assessment (Signed)
[facility] INPATIENT:  Family/Significant Other Suicide Prevention Education  Suicide Prevention Education:  Patient Refusal for Family/Significant Other Suicide Prevention Education: The patient Gary [name] has refused to provide written consent for family/significant other to be provided Family/Significant Other Suicide Prevention Education during admission and/or prior to discharge.  Physician notified.  Felizardo Hoffmann 07/26/2021, 11:41 AM

## 2021-07-26 NOTE — BHH Suicide Risk Assessment (Addendum)
[facility] Admission Suicide Risk Assessment   Nursing information obtained from:  Patient Demographic factors:  Unemployed Current Mental Status:  Suicidal ideation indicated by patient Loss Factors:  Legal issues, decline in health Historical Factors:  substance abuse prior to admission Risk Reduction Factors:  Positive social support  Total Time Spent in Direct Patient Care:  I personally spent 45 minutes on the unit in direct patient care. The direct patient care time included face-to-face time with the patient, reviewing the patient's chart, communicating with other professionals, and coordinating care. Greater than 50% of this time was spent in counseling or coordinating care with the patient regarding goals of hospitalization, psycho-education, and discharge planning needs.   Principal Problem: Alcohol-induced mood disorder (HCC) Diagnosis:  Principal Problem:   Alcohol-induced mood disorder (HCC) Active Problems:   HIV disease (HCC)   Alcohol use disorder, severe, dependence (HCC)   Thrombocytopenia (HCC)   Neutropenia (HCC)  Subjective Data: The patient is a 34y/o male with h/o alcohol induced mood d/o who presented voluntarily to [facility] from [facility] for depressive symptoms, SI, and request for help with alcohol detox.   On assessment today the patient is guarded and does not wish to engage in an interview stating that he "feels fine" and "is ready to go." He states that he completed a 45 day residential rehab program for alcohol abuse in [place] and then got a ride back to [place] in the last 2 1/2 weeks so he could see his ID clinic and get his HIV medications refilled. He states after returning to this area he started hanging out with people who influenced him to relapse and he has been drinking 3-4 bottles of wine/day for the last 2 1/2 weeks. He is vague as to his pattern of alcohol abuse prior to completing his residential rehab program but states he has a h/o withdrawal seizures and  DTs in the past. 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 He denies current alcohol withdrawal symptoms other than some nausea. He has been compliant with scheduled Ativan taper but has refused to consider antidepressant trial when discussed by the [employer]. He denies current feelings of depression, SI, HI, or AVH. He denies paranoia, ideas of reference, or h/o mania. He states he slept well overnight and that his energy, focus, and appetite are improving. He admits to feelings of guilt about his drinking. He thinks his last drink was several days prior to presentation to the ED. He denies previous suicide attempts or psychiatric inpatient admissions but his intake assessment references him previously reporting being at [facility] for 14 days in the past. According to his intake assessment, he has court dates pending for larceny and obstruction of justice for being oppositional with law enforcement. He reported prior to admission that his brother has h/o alcohol abuse and bipolar disorder. See H&P for additional details.   Continued Clinical Symptoms:  Alcohol Use Disorder Identification Test Final Score (AUDIT): 25 The "Alcohol Use Disorders Identification Test", Guidelines for Use in Primary Care, Second Edition.  World Science writer [facility]). Score between 0-7:  no or low risk or alcohol related problems. Score between 8-15:  moderate risk of alcohol related problems. Score between 16-19:  high risk of alcohol related problems. Score 20 or above:  warrants further diagnostic evaluation for alcohol dependence and treatment.  CLINICAL FACTORS:   Alcohol/Substance Abuse/Dependencies Previous Psychiatric Diagnoses and Treatments   Musculoskeletal:  Strength & Muscle Tone: within normal limits Gait & Station: normal, steady Patient leans: N/A  Psychiatric Specialty Exam: Physical  Exam Vitals reviewed.  HENT:     Head: Normocephalic.  Pulmonary:     Effort: Pulmonary effort is normal.  Neurological:     General: No focal deficit present.     Mental Status: He is alert.    Review of Systems - see H&P  Blood pressure 118/89, pulse (!) 111, temperature 98.4 F (36.9 C), temperature source Oral, resp. rate 16, height 6\' 1"  (1.854 m), weight 72.6 kg, SpO2 100 %.Body mass index is 21.11 kg/m.  General Appearance:  thin wearing hospital gown, disheveled appearing  Eye Contact:  Good  Speech:  Clear and Coherent and Normal Rate  Volume:  Normal  Mood:  Irritable  Affect:  Constricted  Thought Process:  Goal Directed and Linear but evasive and vague in descriptions  Orientation:  Full (Time, Place, and Person)  Thought Content:  Logical and denies AVH, paranoia or delusions - no acute psychosis on exam  Suicidal Thoughts:  No  Homicidal Thoughts:  No  Memory:  Recent;   Fair  Judgement:  Impaired  Insight:  Lacking  Psychomotor Activity:  Normal  Concentration:  Concentration: Good and Attention Span: Good  Recall:  of Knowledge:  Fair  Language:  Good  Akathisia:  Negative  Assets:  Desire for Improvement Resilience  ADL's:  independent  Cognition:  WNL  Sleep:  Number of Hours: 2.75   COGNITIVE FEATURES THAT CONTRIBUTE TO RISK:  Closed-mindedness and Thought constriction (tunnel vision)    SUICIDE RISK:   Mild:  There are no identifiable plans, no associated intent, mild dysphoria and related symptoms, good self-control (both objective and subjective assessment), few other risk factors, and identifiable protective factors, including available and accessible social support.  PLAN OF CARE: Patient admitted voluntarily to [facility]. Admission labs reviewed: A1c 4.3, Hepatic function panel WNL except for total bili 1.7, direct bili 0.3, and indirect bili 1.4 (AST 35 and ALT 24); Cholesterol 198, triglycerides 209, LDL 104, HDL 52; CMP WNL except for K+  3.3, creatinine 0.57 and total bili 1.7; WBC 2.7 with ANC 1000 yesterday (no diff today) with H/H 13.7/40.3 platelets 143 (appears chronically low compared to previous platelets of 113 2 months ago); Troponin I 4, UDS positive for benzodiazepines (received Ativan in ED); respiratory panel negative; Tylenol <10, Salicylate <7, ETOH 231; Lipase 40; EKG shows sinus rhythm with RSR' in V1 V2 and borderline prolonged QTC 01-16-1984 unchanged since previous tracing.   Patient placed on CIWA protocol with scheduled Ativan taper and MVI, thiamine and Ativan 1mg  additional for CIWA score >10. Will repeat CBC with diff and contact ID or hospitalist for recommendations about neutropenia given his HIV status. Will encourage him to wear a mask. Will move to no-roommate room and attempt neutropenic precautions. Will see if he received K+ replacement and recheck BMP and Mag for electrolyte trending. Will recheck EKG for QTC monitoring. Encouraged patient to stay to complete Ativan taper to safely detox from alcohol and to consider SAIOP or residential rehab. He was offered Neurontin 100mg  tid titrating up to help with withdrawal and potential for alcohol withdrawal seizures. Will place on seizure precautions. He declines start of any other psychotropic medications including antidepressants at this time. SW to assist with safety planning and attempts to obtain collateral. Will check TSH. Will place patient on Protonix prophylactically for GI protection and encourage po hydration  and Ensure supplements. Will verify and restart home HIV medication.  I certify that inpatient services furnished can reasonably be expected to improve the patient's condition.   Comer Locket, MD, FAPA 07/26/2021, 2:04 PM

## 2021-07-26 NOTE — BHH Group Notes (Signed)
Pt received anger management pycho-ed group materials and was encouraged to come to staff to discuss it 1:1 once completed.  

## 2021-07-26 NOTE — Progress Notes (Signed)
   07/26/21 2221  Psych Admission Type (Psych Patients Only)  Admission Status Voluntary  Psychosocial Assessment  Patient Complaints Substance abuse;Anxiety  Eye Contact Brief  Facial Expression Anxious  Affect Appropriate to circumstance  Speech Logical/coherent  Interaction Assertive  Motor Activity Slow;Unsteady  Appearance/Hygiene Disheveled;In hospital gown  Behavior Characteristics Appropriate to situation  Mood Anxious  Thought Process  Coherency WDL  Content WDL  Delusions None reported or observed  Perception Hallucinations  Hallucination Auditory;Visual  Judgment Poor  Confusion None  Danger to Self  Current suicidal ideation? Passive  Self-Injurious Behavior No self-injurious ideation or behavior indicators observed or expressed   Agreement Not to Harm Self Yes  Description of Agreement verbal contract for safety  Danger to Others  Danger to Others None reported or observed
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
## 2021-07-26 NOTE — Tx Team (Signed)
Initial Treatment Plan 07/26/2021 2:11 AM [name] [id number]    PATIENT STRESSORS: Legal issue   Marital or family conflict   Substance abuse     PATIENT STRENGTHS: General fund of knowledge  Motivation for treatment/growth    PATIENT IDENTIFIED PROBLEMS: Risk for SI  SA (ETOH, Meth)  "Detox"                 DISCHARGE CRITERIA:  Improved stabilization in mood, thinking, and/or behavior Verbal commitment to aftercare and medication compliance  PRELIMINARY DISCHARGE PLAN: Attend aftercare/continuing care group Attend 12-step recovery group Outpatient therapy  PATIENT/FAMILY INVOLVEMENT: This treatment plan has been presented to and reviewed with the patient, Gary [name].  The patient and family have been given the opportunity to ask questions and make suggestions.  Delos Haring, RN 07/26/2021, 2:11 AM
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
## 2021-07-26 NOTE — Group Note (Signed)
LCSW Group Therapy Note  07/26/2021  10-11am  Type of Therapy and Topic:  Group Therapy: "My Mental Health"  Participation Level:  Did Not Attend   Description of Group:   In this group, patients were asked four questions in order to generate discussion around the idea of mental illness and/or substance addiction being medical problems: In one sentence describe the current state of your mental health or substance use. How much do you feel similar to or different from others? Do you tend to identify with other people or compare yourself to them?  In a word or sentence, share what you desire your mental health or substance use to be moving forward.  Discussion was held that led to the conclusion that comparing ourselves to others is not healthy, but identifying with the elements of their issues that are similar to ours is helpful.    Therapeutic Goals:  Patients will identify their feelings about their current mental health/substance use problems. Patients will describe how they feel similar to or different from others, and whether they tend to identify with or compare themselves to other people with the same issues. Patients will explore the differences in these concepts and how a change of mindset about mental health/substance use can help with reaching recovery goals. Patients will think about and share what their recovery goals are, in terms of mental health and/or substance use.  Summary of Patient Progress:  The patient was invited to group, did not attend  Therapeutic Modalities:   Processing Psychoeducation  Cleta Heatley J Grossman-Orr, MSW, LCSW 

## 2021-07-26 NOTE — ED Notes (Signed)
Safe transport here for pt. Pt verified his belongings and sent with him

## 2021-07-26 NOTE — H&P (Addendum)
Psychiatric Admission Assessment Adult  Patient Identification: Gary [name] MRN:  [id number] Date of Evaluation:  07/26/2021 Chief Complaint:  alcohol abuse; SI Principal Diagnosis: Alcohol-induced mood disorder (HCC) Diagnosis:  Principal Problem:   Alcohol-induced mood disorder (HCC) Active Problems:   HIV disease (HCC)   Alcohol use disorder, severe, dependence (HCC)   Thrombocytopenia (HCC)   Neutropenia (HCC)  History of Present Illness:  Gary [name] is a 35 yr old male who presents with SI and EtOH abuse.  PPHx is significant for MDD, Recurrent, Severe, severe EtOH abuse, multiple ED visits and rehab stays.  Patient is a poor historian giving differing accounts to different providers.  Patient reports that he is doing well today and that he no longer needs to be here.  He reports that he originally went to the ED seeking help because he had run out of alcohol.  However, now he states that he has a lot of thinking and will simply stop drinking.  He reports that his family and friends have come out of their lives because of his drinking and that this has finally made him realize that he needs to stop.  When discussing the reports from the ED that he had made statements about SI he states this was only the alcohol speaking and that he has no thoughts of hurting himself.  When offered medication here in declines stating that he will just be able to stop drinking and does not need any medication.  When offered medication for his depression he reports that if he can stop drinking this will also improve without medication and declines any.  Discussed with him that since he received 6 mg of Ativan in the ED yesterday and given his history of DTs it would be very dangerous to discharge him today and it would be in his best interest to continue the Ativan taper.  Eventually he did agree to give [place] till Monday to do the Ativan taper.  Talked with him at length about the risks of suddenly stopping  Ativan again given his history of DTs and recommended starting a medication like gabapentin for further help.  Initially he was hesitant and declined but eventually stated he would consider it.  Discussed with him that I would order it and he was free to take it or not however strongly advised that given his history of DTs it would be safest for him to take it to help prevent seizures.   He stated that he has been sober until about 2 weeks ago when he started drinking again.  He reports that he has been drinking 3-4 bottles of wine a day.  He reports that he used to smoke some but now does not like the taste of it and is in the active quitting and has not smoked for about 3 to 4 days before being admitted to the hospital.  He reports no illicit substance use.  He reports that he is currently homeless, however, he states that if he were to be discharged she does have good friend who would be able to provide housing to him.  He states that this friend would be a good influence on him because he does not drink, smoke, or do drugs.  When asked if he had been in rehab for his drinking he states the last time was in January 2022 at [facility].  He reports that he slept okay last night and that this is why he is feeling so much better today.  He  reports no symptoms of withdrawal today.  He reports that while he did not really want to eat breakfast he still able to drink plenty of fluids and will eat lunch.  He denies any symptoms of depression, anxiety, mania, or psychosis.  When asked about family history of psychiatric illness he does reports that there is some alcohol abuse in his family history.  Associated Signs/Symptoms: Depression Symptoms:   Reports None Duration of Depression Symptoms: NA  (Hypo) Manic Symptoms:   Reports None Anxiety Symptoms:   Reports None Psychotic Symptoms:   Reports None PTSD Symptoms: Reports None Total Time spent with patient: 1 hour  Past Psychiatric History: MDD, Recurrent,  Severe, severe EtOH abuse, multiple ED visits and rehab stays  Is the patient at risk to self? Yes.    Has the patient been a risk to self in the past 6 months? Yes.    Has the patient been a risk to self within the distant past? Yes.    Is the patient a risk to others? No.  Has the patient been a risk to others in the past 6 months? No.  Has the patient been a risk to others within the distant past? No.   Prior Inpatient Therapy:  Yes  Alcohol Screening: 1. How often do you have a drink containing alcohol?: 4 or more times a week 2. How many drinks containing alcohol do you have on a typical day when you are drinking?: 10 or more 3. How often do you have six or more drinks on one occasion?: Weekly AUDIT-C Score: 11 4. How often during the last year have you found that you were not able to stop drinking once you had started?: Monthly 5. How often during the last year have you failed to do what was normally expected from you because of drinking?: Monthly 6. How often during the last year have you needed a first drink in the morning to get yourself going after a heavy drinking session?: Daily or almost daily 7. How often during the last year have you had a feeling of guilt of remorse after drinking?: Daily or almost daily 8. How often during the last year have you been unable to remember what happened the night before because you had been drinking?: Never 9. Have you or someone else been injured as a result of your drinking?: No 10. Has a relative or friend or a doctor or another health worker been concerned about your drinking or suggested you cut down?: Yes, but not in the last year Alcohol Use Disorder Identification Test Final Score (AUDIT): 25 Alcohol Brief Interventions/Follow-up: Alcohol education/Brief advice Substance Abuse History in the last 12 months:  Yes.   Consequences of Substance Abuse: Medical Consequences:  Multiple hospitalizations and ER visits Legal Consequences:  Stole  wine and has court dates Family Consequences:  Kicked him out Previous Psychotropic Medications: Yes  Psychological Evaluations: No  Past Medical History:  Past Medical History:  Diagnosis Date   Alcohol abuse    HIV (human immunodeficiency virus infection) (HCC)    Seizures (HCC)    Subdural hematoma (HCC)     Past Surgical History:  Procedure Laterality Date   INCISION AND DRAINAGE PERIRECTAL ABSCESS N/A 09/24/2016   Procedure: IRRIGATION AND DEBRIDEMENT PERIRECTAL ABSCESS;  Surgeon: Ricarda Frame, MD;  Location: [facility] ORS;  Service: General;  Laterality: N/A;   none     Family History:  Family History  Problem Relation Age of Onset   Alcohol abuse Mother  Alcohol abuse Father    Family Psychiatric  History: Brother- Bipolar Disorder and EtOH abuse Social History:  Social History   Substance and Sexual Activity  Alcohol Use Yes   Comment: "I drink all I can"     Social History   Substance and Sexual Activity  Drug Use Yes   Types: Methamphetamines, Marijuana   Comment: last used 04/10/2019    Additional Social History: Marital status: Single Are you sexually active?: No What is your sexual orientation?: "That doesn't matter" Has your sexual activity been affected by drugs, alcohol, medication, or emotional stress?: UTA Does patient have children?: No                         Allergies:   Allergies  Allergen Reactions   Tegretol [Carbamazepine] Other (See Comments)    Caused vertigo for 2 days after taking it   Caffeine Palpitations   Lab Results:  Results for orders placed or performed during the hospital encounter of 07/26/21 (from the past 48 hour(s))  CBC     Status: Abnormal   Collection Time: 07/26/21  6:53 AM  Result Value Ref Range   WBC 2.7 (L) 4.0 - 10.5 K/uL   RBC 3.94 (L) 4.22 - 5.81 MIL/uL   Hemoglobin 13.7 13.0 - 17.0 g/dL   HCT 39.7 67.3 - 41.9 %   MCV 102.3 (H) 80.0 - 100.0 fL   MCH 34.8 (H) 26.0 - 34.0 pg   MCHC 34.0 30.0 -  36.0 g/dL   RDW 37.9 (H) 02.4 - 09.7 %   Platelets 143 (L) 150 - 400 K/uL    Comment: Immature Platelet Fraction may be clinically indicated, consider ordering this additional test [id number] REPEATED TO VERIFY    nRBC 0.0 0.0 - 0.2 %    Comment: Performed at [facility], [address], [place], [place] [address]  Comprehensive metabolic panel     Status: Abnormal   Collection Time: 07/26/21  6:53 AM  Result Value Ref Range   Sodium 135 135 - 145 mmol/L   Potassium 3.3 (L) 3.5 - 5.1 mmol/L   Chloride 99 98 - 111 mmol/L   CO2 28 22 - 32 mmol/L   Glucose, Bld 94 70 - 99 mg/dL    Comment: Glucose reference range applies only to samples taken after fasting for at least 8 hours.   BUN 8 6 - 20 mg/dL   Creatinine, Ser 1.96 (L) 0.61 - 1.24 mg/dL   Calcium 9.6 8.9 - 22.2 mg/dL   Total Protein 7.4 6.5 - 8.1 g/dL   Albumin 4.1 3.5 - 5.0 g/dL   AST 34 15 - 41 U/L   ALT 24 0 - 44 U/L   Alkaline Phosphatase 75 38 - 126 U/L   Total Bilirubin 1.7 (H) 0.3 - 1.2 mg/dL   GFR, Estimated >97 >98 mL/min    Comment: (NOTE) Calculated using the CKD-EPI Creatinine Equation (2021)    Anion gap 8 5 - 15    Comment: Performed at [facility], [address], [place], [place] [address]  Lipid panel     Status: Abnormal   Collection Time: 07/26/21  6:53 AM  Result Value Ref Range   Cholesterol 198 0 - 200 mg/dL   Triglycerides 417 (H) <150 mg/dL   HDL 52 >40 mg/dL   Total CHOL/HDL Ratio 3.8 RATIO   VLDL 42 (H) 0 - 40 mg/dL   LDL Cholesterol 814 (H)  0 - 99 mg/dL    Comment:        Total Cholesterol/HDL:CHD Risk Coronary Heart Disease Risk Table                     Men   Women  1/2 Average Risk   3.4   3.3  Average Risk       5.0   4.4  2 X Average Risk   9.6   7.1  3 X Average Risk  23.4   11.0        Use the calculated Patient Ratio above and the CHD Risk Table to determine the patient's CHD Risk.        ATP III CLASSIFICATION (LDL):  <100     mg/dL    Optimal  662-947  mg/dL   Near or Above                    Optimal  130-159  mg/dL   Borderline  654-650  mg/dL   High  >354     mg/dL   Very High Performed at [facility], [address], [place], [place] [address]   Hepatic function panel     Status: Abnormal   Collection Time: 07/26/21  6:53 AM  Result Value Ref Range   Total Protein 7.5 6.5 - 8.1 g/dL   Albumin 4.0 3.5 - 5.0 g/dL   AST 35 15 - 41 U/L   ALT 24 0 - 44 U/L   Alkaline Phosphatase 75 38 - 126 U/L   Total Bilirubin 1.7 (H) 0.3 - 1.2 mg/dL   Bilirubin, Direct 0.3 (H) 0.0 - 0.2 mg/dL   Indirect Bilirubin 1.4 (H) 0.3 - 0.9 mg/dL    Comment: Performed at [facility], [address], [place], [place] [address]  Hemoglobin A1c     Status: Abnormal   Collection Time: 07/26/21  6:53 AM  Result Value Ref Range   Hgb A1c MFr Bld 4.3 (L) 4.8 - 5.6 %    Comment: (NOTE) Pre diabetes:          5.7%-6.4%  Diabetes:              >6.4%  Glycemic control for   <7.0% adults with diabetes    Mean Plasma Glucose 76.71 mg/dL    Comment: Performed at [facility], [address], [place], [place] [address]    Blood Alcohol level:  Lab Results  Component Value Date   ETH 231 (H) 07/24/2021   ETH 308 (HH) 07/20/2021    Metabolic Disorder Labs:  Lab Results  Component Value Date   HGBA1C 4.3 (L) 07/26/2021   MPG 76.71 07/26/2021   MPG 99.67 03/12/2021   No results found for: PROLACTIN Lab Results  Component Value Date   CHOL 198 07/26/2021   TRIG 209 (H) 07/26/2021   HDL 52 07/26/2021   CHOLHDL 3.8 07/26/2021   VLDL 42 (H) 07/26/2021   LDLCALC 104 (H) 07/26/2021   LDLCALC 79 09/19/2019    Current Medications: Current Facility-Administered Medications  Medication Dose Route Frequency Provider Last Rate Last Admin   bictegravir-emtricitabine-tenofovir AF (BIKTARVY) 50-200-25 MG per tablet 1 tablet  1 tablet Oral QPC lunch Leevy-Johnson, Brooke A, NP   1 tablet at  07/26/21 1245   feeding supplement (ENSURE ENLIVE / ENSURE PLUS) liquid 237 mL  237 mL Oral BID BM Bobbitt, Shalon E, NP       folic  acid (FOLVITE) tablet 1 mg  1 mg Oral Daily Leevy-Johnson, Brooke A, NP       gabapentin (NEURONTIN) capsule 100 mg  100 mg Oral TID Lauro Franklin, MD   100 mg at 07/26/21 1245   hydrOXYzine (ATARAX/VISTARIL) tablet 25 mg  25 mg Oral Q6H PRN Bobbitt, Shalon E, NP   25 mg at 07/26/21 0142   loperamide (IMODIUM) capsule 2-4 mg  2-4 mg Oral PRN Bobbitt, Shalon E, NP       LORazepam (ATIVAN) tablet 1 mg  1 mg Oral Q6H PRN Bobbitt, Shalon E, NP       LORazepam (ATIVAN) tablet 1 mg  1 mg Oral QID Bobbitt, Shalon E, NP   1 mg at 07/26/21 1244   Followed by   Melene Muller ON 07/27/2021] LORazepam (ATIVAN) tablet 1 mg  1 mg Oral TID Bobbitt, Shalon E, NP       Followed by   Melene Muller ON 07/28/2021] LORazepam (ATIVAN) tablet 1 mg  1 mg Oral BID Bobbitt, Shalon E, NP       Followed by   Melene Muller ON 07/29/2021] LORazepam (ATIVAN) tablet 1 mg  1 mg Oral Daily Bobbitt, Shalon E, NP       multivitamin with minerals tablet 1 tablet  1 tablet Oral Daily Bobbitt, Shalon E, NP       ondansetron (ZOFRAN-ODT) disintegrating tablet 4 mg  4 mg Oral Q6H PRN Bobbitt, Shalon E, NP   4 mg at 07/26/21 0945   pantoprazole (PROTONIX) EC tablet 20 mg  20 mg Oral BID Leevy-Johnson, Brooke A, NP       potassium chloride SA (KLOR-CON) CR tablet 40 mEq  40 mEq Oral Once Lauro Franklin, MD       [START ON 07/27/2021] thiamine tablet 100 mg  100 mg Oral Daily Bobbitt, Shalon E, NP       PTA Medications: Medications Prior to Admission  Medication Sig Dispense Refill Last Dose   chlordiazePOXIDE (LIBRIUM) 25 MG capsule 50mg  PO TID x 1D, then 25-50mg  PO BID X 1D, then 25-50mg  PO QD X 1D (Patient not taking: No sig reported) 10 capsule 0    ondansetron (ZOFRAN ODT) 4 MG disintegrating tablet Take 1 tablet (4 mg total) by mouth every 8 (eight) hours as needed for nausea or vomiting. (Patient not taking:  No sig reported) 20 tablet 0     Musculoskeletal: Strength & Muscle Tone: within normal limits Gait & Station: normal, occasionally unsteady Patient leans: N/A   Psychiatric Specialty Exam:  Presentation  General Appearance: Disheveled (in scrubs)  Eye Contact:Fair  Speech:Clear and Coherent; Slow  Speech Volume:Normal  Handedness:Right   Mood and Affect  Mood:Dysphoric; Irritable  Affect:Non-Congruent (Guarded)   Thought Process  Thought Processes:Coherent; Goal Directed  Duration of Psychotic Symptoms: NA  Past Diagnosis of Schizophrenia or Psychoactive disorder: No  Descriptions of Associations:Intact  Orientation:Full (Time, Place and Person)  Thought Content:WDL  Hallucinations:Hallucinations: None Ideas of Reference:None  Suicidal Thoughts:Suicidal Thoughts: No Homicidal Thoughts:Homicidal Thoughts: No  Sensorium  Memory:Immediate Fair; Recent Fair  Judgment:Poor  Insight:Poor   Executive Functions  Concentration:Good  Attention Span:Good  Recall:Fair  Fund of Knowledge:Fair  Language:Good   Psychomotor Activity  Psychomotor Activity: Psychomotor Activity: Normal (mild tremor in arms)  Assets  Assets:Communication Skills; Resilience   Sleep  Sleep: Sleep: Fair   Physical Exam: Physical Exam Vitals and nursing note reviewed.  Constitutional:      General: He is not in acute distress.  Appearance: Normal appearance. He is not ill-appearing or toxic-appearing.  HENT:     Head: Normocephalic and atraumatic.  Pulmonary:     Effort: Pulmonary effort is normal.  Musculoskeletal:        General: Normal range of motion.  Neurological:     General: No focal deficit present.     Mental Status: He is alert.   Review of Systems  Respiratory:  Negative for cough and shortness of breath.   Cardiovascular:  Negative for chest pain.  Gastrointestinal:  Negative for abdominal pain, constipation, diarrhea, nausea and vomiting.   Neurological:  Negative for weakness and headaches.  Psychiatric/Behavioral:  Positive for substance abuse. Negative for depression, hallucinations and suicidal ideas.   Blood pressure 118/89, pulse (!) 111, temperature 98.4 F (36.9 C), temperature source Oral, resp. rate 16, height  (1.854 m), weight 72.6 kg, SpO2 100 %. Body mass index is 21.11 kg/m.  Treatment Plan Summary: Daily contact with patient to assess and evaluate symptoms and progress in treatment  Mr. Alberto is a 35 yr old male who presents with SI and EtOH abuse.  PPHx is significant for MDD, Recurrent, Severe, severe EtOH abuse, multiple ED visits and rehab stays.   Currently patient is refusing all treatment for his depression.  He reports that he is fine now and wants to discharge.  Discussed with him the dangers of discharging seeing as how he required 6 mg of Ativan yesterday while in ED for his withdrawal and his past history of DT's.  Eventually he was able to get him to agree to stay till Monday to get through the Ativan taper.  Offered him gabapentin initially he stated he would not take it.  We will still order it so that it is offered to him and told him that if he wishes to take it he can or he can refuse it.  Discussed with him that it would help prevent DTs and help with his cravings.  Discussed with him safety planning would have to be carried out at a minimum for him to be discharged and he stated he would find a phone number for social work to call.  His labs showed neutropenia and given his HIV history called ID for recommendations.  They recommended getting a CD4 count and viral load.  They will also assist in getting him a follow-up appointment at the clinic.  He also recommended continuing the [place].  We will continue to monitor.   Alcohol induced mood d/o (r/o MDD by hx) -Refusing medications at this time   EtOH use d/o - severe dependence: -Continue CIWA, last score 4 -Start Gabapentin 100 mg  TID -Continue scheduled Ativan taper -Continue Ativan 1 mg q6 PRN CIWA>10 -Continue Imodium 2-4 mg PRN -Continue Zofran-ODT 4 mg q6 PRN -Continue Thiamine 100 mg daily -Continue Folic Acid 1 mg daily -Continue Multivitamin daily -Continue Protonix 20 mg BID  Hypokalemia: -K-dur 40 mEq once -Recheck CMP tonight  Neutropenia: -Redraw CBC w/ Diff tonight -Obtain CD4 and viral load tonight -Continue Biktarvy 1 tablet daily after lunch - Neutropenic precautions  Observation Level/Precautions:  15 minute checks  Laboratory:  CBC- WBC: 2.8 (low)  RBC: 4.0 (low)  MCV: 100.8 (high) CMP: WNL    EtOH: 231  UDS- Benzo positive  Psychotherapy:    Medications:  Ativan taper, Gabapentin  Consultations:    Discharge Concerns:  Safety planning, withdrawal seizures  Estimated LOS: 1-3 days  Other:     Physician Treatment Plan for  Primary Diagnosis: Alcohol-induced mood disorder (HCC) Long Term Goal(s): Improvement in symptoms so as ready for discharge  Short Term Goals: Ability to identify changes in lifestyle to reduce recurrence of condition will improve, Ability to verbalize feelings will improve, Ability to disclose and discuss suicidal ideas, Ability to demonstrate self-control will improve, Ability to identify and develop effective coping behaviors will improve, Ability to maintain clinical measurements within normal limits will improve, and Ability to identify triggers associated with substance abuse/mental health issues will improve  Physician Treatment Plan for Secondary Diagnosis: Principal Problem:   Alcohol-induced mood disorder (HCC) Active Problems:   HIV disease (HCC)   Alcohol use disorder, severe, dependence (HCC)   Thrombocytopenia (HCC)   Neutropenia (HCC)  Long Term Goal(s): Improvement in symptoms so as ready for discharge  Short Term Goals: Ability to identify changes in lifestyle to reduce recurrence of condition will improve, Ability to verbalize feelings will  improve, Ability to disclose and discuss suicidal ideas, Ability to demonstrate self-control will improve, Ability to identify and develop effective coping behaviors will improve, Ability to maintain clinical measurements within normal limits will improve, and Ability to identify triggers associated with substance abuse/mental health issues will improve  I certify that inpatient services furnished can reasonably be expected to improve the patient's condition.    Rhea Belton, PGY2 9/17/20223:24 PM

## 2021-07-26 NOTE — Progress Notes (Signed)
Adult Psychoeducational Group Note  Date:  07/26/2021 Time:  9:19 PM  Group Topic/Focus:  Wrap-Up Group:   The focus of this group is to help patients review their daily goal of treatment and discuss progress on daily workbooks.  Participation Level:  Minimal  Participation Quality:  Appropriate  Affect:  Appropriate  Cognitive:  Appropriate  Insight: Appropriate  Engagement in Group:  Limited  Modes of Intervention:  Discussion  Additional Comments:  Pt stated his goal for today was to focus on his treatment plan. Pt stated he accomplished his goal today. Pt stated he talked with his doctor and social worker about his care today. Pt rated his overall day a 0 out of 10. Pt stated he made no calls today. Pt stated he felt better about himself today. Pt stated he took all medications provided today.  Pt stated his appetite was poor today. Pt rated sleep last night was poor. Pt stated the goal tonight was to get some rest. Pt stated he had no physical pain today. Pt deny visual hallucinations and auditory issues tonight. Pt denies thoughts of harming himself or others. Pt stated he would alert staff if anything changed.  Gary [name] 07/26/2021, 9:19 PM

## 2021-07-26 NOTE — [facility] Group Notes (Signed)
Adult Psychoeducational Group Note  Date:  07/26/2021 Time:  10:24 AM  Group Topic/Focus:  Goals Group:   The focus of this group is to help patients establish daily goals to achieve during treatment and discuss how the patient can incorporate goal setting into their daily lives to aide in recovery.   Modes of Intervention:  Activity  Additional Comments:  Pt wasn't feeling well this morning and unable to attend.   Deforest Hoyles Jisselle Poth 07/26/2021, 10:24 AM

## 2021-07-27 MED ORDER — MAGNESIUM OXIDE -MG SUPPLEMENT 400 (240 MG) MG PO TABS
400.0000 mg | ORAL_TABLET | Freq: Once | ORAL | Status: AC
  Administered 2021-07-27: 400 mg via ORAL
  Filled 2021-07-27 (×2): qty 1

## 2021-07-27 NOTE — Progress Notes (Signed)
Pt stated that he is still experiencing some nausea, feels weak.  Pt states he has only eaten a few bags of potato chips in the last three days.  Pt did wish to go and pick out his breakfast but stated that he would feel safer eating in his room in case  he became ill.  Order obtained for Pt to do this this morning.

## 2021-07-27 NOTE — [facility] Group Notes (Signed)
Adult Psychoeducational Group Note  Date:  07/27/2021 Time:  10:47 AM  Group Topic/Focus:  Goals Group:   The focus of this group is to help patients establish daily goals to achieve during treatment and discuss how the patient can incorporate goal setting into their daily lives to aide in recovery.  Participation Level:  Did Not Attend  Participation Quality:   Did not attend  Affect:   Did not attend  Cognitive:   Did not attend  Insight: None  Engagement in Group:   Did not attend  Modes of Intervention:   Did not attend  Additional Comments:  Pt did not attend due to being asleep.  Brinlynn Gorton, Sharen Counter 07/27/2021, 10:47 AM

## 2021-07-27 NOTE — [facility] Group Notes (Signed)
Pt did not attend group. 

## 2021-07-27 NOTE — [facility] Group Notes (Signed)
Pt did not attend relaxation group.  

## 2021-07-27 NOTE — Progress Notes (Signed)
   07/27/21 1900  Psych Admission Type (Psych Patients Only)  Admission Status Voluntary  Psychosocial Assessment  Patient Complaints Anxiety;Substance abuse  Eye Contact Brief  Facial Expression Anxious  Affect Appropriate to circumstance  Speech Logical/coherent  Interaction Assertive  Motor Activity Slow;Unsteady  Appearance/Hygiene Disheveled;In hospital gown  Behavior Characteristics Appropriate to situation  Mood Anxious  Thought Process  Coherency WDL  Content WDL  Delusions None reported or observed  Perception Hallucinations  Hallucination Auditory;Visual  Judgment Poor  Confusion None  Danger to Self  Current suicidal ideation? Passive  Self-Injurious Behavior No self-injurious ideation or behavior indicators observed or expressed   Agreement Not to Harm Self Yes  Description of Agreement verbal contract for safety  Danger to Others  Danger to Others None reported or observed

## 2021-07-27 NOTE — Group Note (Signed)
[facility] Group Notes: (Clinical Social Work)   07/27/2021      Type of Therapy:  Group Therapy   Participation Level:  Did Not Attend - was invited both individually by [facility] and by overhead announcement, chose not to attend.   Kamayah Pillay Grossman-Orr, LCSW 07/27/2021, 10:55 AM    

## 2021-07-27 NOTE — Progress Notes (Addendum)
[facility] MD Progress Note  07/27/2021 12:02 PM DIAZ CRAGO  MRN:  [id number] Subjective:   Mr. Gary [name] is a 35 yr old male who presents with SI and EtOH abuse.  PPHx is significant for MDD, Recurrent, Severe, severe EtOH abuse, multiple ED visits and rehab stays.   Case was discussed in the multidisciplinary team. MAR was reviewed and patient was not compliant with medications. He refused Kdur, folic acid, Ensure, and multivitamin.   He reports that he feels fairly awful today.  He reports that he slept not great last night.  He reports that his appetite is very poor but he was able to make himself eat some breakfast.  He reports no SI, HI, or AVH.  Here reports that he is having diarrhea and continues to have nausea and vomiting from his withdrawal.  He reports that he does still have some tremor however reports no sweating.  He reports that from the vomiting he does have some mild chest pain/shortness of breath.  When he was going up to try to get his medications he reported feeling very weak and dizzy.  Orthostatics were taken on him discussed with him the need to drink plenty of fluids today and that we would have him moved closer to the nurses station so that he would not have to walk as far to get his medications.  Discussed with him his lab results of his potassium and magnesium being low and the importance of taking them the repletion supplements that we were offering.  He reported that he would take them.  Discussed with him we would then recheck his blood work tomorrow morning to make sure that his body appropriately responded to it.  Discussed with him that these deficiencies were most likely due to his poor appetite. Discussed his improving WBC/ANC but stressed the need to see ID as an outpatient for management of his HIV. He reports no other concerns at present.   Principal Problem: Alcohol-induced mood disorder (HCC) Diagnosis: Principal Problem:   Alcohol-induced mood disorder  (HCC) Active Problems:   HIV disease (HCC)   Alcohol use disorder, severe, dependence (HCC)   Thrombocytopenia (HCC)   Neutropenia (HCC)  Total Time spent with patient: 30 minutes  Past Psychiatric History: MDD, Recurrent, Severe, severe EtOH abuse, multiple ED visits and rehab stays  Past Medical History:  Past Medical History:  Diagnosis Date   Alcohol abuse    HIV (human immunodeficiency virus infection) (HCC)    Seizures (HCC)    Subdural hematoma (HCC)     Past Surgical History:  Procedure Laterality Date   INCISION AND DRAINAGE PERIRECTAL ABSCESS N/A 09/24/2016   Procedure: IRRIGATION AND DEBRIDEMENT PERIRECTAL ABSCESS;  Surgeon: Ricarda Frame, MD;  Location: [facility] ORS;  Service: General;  Laterality: N/A;   none     Family History:  Family History  Problem Relation Age of Onset   Alcohol abuse Mother    Alcohol abuse Father    Family Psychiatric  History: Brother- Bipolar Disorder and EtOH abuse Social History:  Social History   Substance and Sexual Activity  Alcohol Use Yes   Comment: "I drink all I can"     Social History   Substance and Sexual Activity  Drug Use Yes   Types: Methamphetamines, Marijuana   Comment: last used 04/10/2019    Social History   Socioeconomic History   Marital status: Single    Spouse name: Not on file   Number of children: Not on file  Years of education: Not on file   Highest education level: Not on file  Occupational History   Occupation: unemployed  Tobacco Use   Smoking status: Every Day    Packs/day: 0.50    Types: Cigarettes   Smokeless tobacco: Never   Tobacco comments:    unable to smoke while incarcerated 6+ months 02/13/20  Vaping Use   Vaping Use: Never used  Substance and Sexual Activity   Alcohol use: Yes    Comment: "I drink all I can"   Drug use: Yes    Types: Methamphetamines, Marijuana    Comment: last used 04/10/2019   Sexual activity: Yes    Partners: Male, Male    Comment: declined condoms   03/2021  Other Topics Concern   Not on file  Social History Narrative   "Currently living on the streets"   Independent at baseline   Social Determinants of Health   Financial Resource Strain: Not on file  Food Insecurity: Not on file  Transportation Needs: Not on file  Physical Activity: Not on file  Stress: Not on file  Social Connections: Not on file    Sleep: Fair  Appetite:  Poor  Current Medications: Current Facility-Administered Medications  Medication Dose Route Frequency Provider Last Rate Last Admin   bictegravir-emtricitabine-tenofovir AF (BIKTARVY) 50-200-25 MG per tablet 1 tablet  1 tablet Oral QPC lunch Leevy-Johnson, Brooke A, NP   1 tablet at 07/26/21 1245   feeding supplement (ENSURE ENLIVE / ENSURE PLUS) liquid 237 mL  237 mL Oral BID BM Bobbitt, Shalon E, NP   237 mL at 07/27/21 1005   folic acid (FOLVITE) tablet 1 mg  1 mg Oral Daily Leevy-Johnson, Brooke A, NP   1 mg at 07/27/21 0957   gabapentin (NEURONTIN) capsule 100 mg  100 mg Oral TID Lauro Franklin, MD   100 mg at 07/27/21 0957   hydrOXYzine (ATARAX/VISTARIL) tablet 25 mg  25 mg Oral Q6H PRN Bobbitt, Shalon E, NP   25 mg at 07/26/21 2113   loperamide (IMODIUM) capsule 2-4 mg  2-4 mg Oral PRN Bobbitt, Shalon E, NP   2 mg at 07/27/21 1108   LORazepam (ATIVAN) tablet 1 mg  1 mg Oral Q6H PRN Bobbitt, Shalon E, NP   1 mg at 07/26/21 2113   LORazepam (ATIVAN) tablet 1 mg  1 mg Oral TID Bobbitt, Shalon E, NP   1 mg at 07/27/21 0957   Followed by   Melene Muller ON 07/28/2021] LORazepam (ATIVAN) tablet 1 mg  1 mg Oral BID Bobbitt, Shalon E, NP       Followed by   Melene Muller ON 07/29/2021] LORazepam (ATIVAN) tablet 1 mg  1 mg Oral Daily Bobbitt, Shalon E, NP       magnesium oxide (MAG-OX) tablet 400 mg  400 mg Oral Once Lauro Franklin, MD       multivitamin with minerals tablet 1 tablet  1 tablet Oral Daily Bobbitt, Shalon E, NP   1 tablet at 07/27/21 0957   ondansetron (ZOFRAN-ODT) disintegrating tablet 4 mg   4 mg Oral Q6H PRN Bobbitt, Shalon E, NP   4 mg at 07/26/21 0945   pantoprazole (PROTONIX) EC tablet 20 mg  20 mg Oral BID Leevy-Johnson, Brooke A, NP   20 mg at 07/27/21 0957   potassium chloride SA (KLOR-CON) CR tablet 40 mEq  40 mEq Oral Once Lauro Franklin, MD       thiamine tablet 100 mg  100 mg Oral Daily  Bobbitt, Shalon E, NP   100 mg at 07/27/21 0957    Lab Results:  Results for orders placed or performed during the hospital encounter of 07/26/21 (from the past 48 hour(s))  CBC     Status: Abnormal   Collection Time: 07/26/21  6:53 AM  Result Value Ref Range   WBC 2.7 (L) 4.0 - 10.5 K/uL   RBC 3.94 (L) 4.22 - 5.81 MIL/uL   Hemoglobin 13.7 13.0 - 17.0 g/dL   HCT 16.1 09.6 - 04.5 %   MCV 102.3 (H) 80.0 - 100.0 fL   MCH 34.8 (H) 26.0 - 34.0 pg   MCHC 34.0 30.0 - 36.0 g/dL   RDW 40.9 (H) 81.1 - 91.4 %   Platelets 143 (L) 150 - 400 K/uL    Comment: Immature Platelet Fraction may be clinically indicated, consider ordering this additional test [id number] REPEATED TO VERIFY    nRBC 0.0 0.0 - 0.2 %    Comment: Performed at [facility], [address], [place], [place] [address]  Comprehensive metabolic panel     Status: Abnormal   Collection Time: 07/26/21  6:53 AM  Result Value Ref Range   Sodium 135 135 - 145 mmol/L   Potassium 3.3 (L) 3.5 - 5.1 mmol/L   Chloride 99 98 - 111 mmol/L   CO2 28 22 - 32 mmol/L   Glucose, Bld 94 70 - 99 mg/dL    Comment: Glucose reference range applies only to samples taken after fasting for at least 8 hours.   BUN 8 6 - 20 mg/dL   Creatinine, Ser 7.84 (L) 0.61 - 1.24 mg/dL   Calcium 9.6 8.9 - 69.6 mg/dL   Total Protein 7.4 6.5 - 8.1 g/dL   Albumin 4.1 3.5 - 5.0 g/dL   AST 34 15 - 41 U/L   ALT 24 0 - 44 U/L   Alkaline Phosphatase 75 38 - 126 U/L   Total Bilirubin 1.7 (H) 0.3 - 1.2 mg/dL   GFR, Estimated >29 >52 mL/min    Comment: (NOTE) Calculated using the CKD-EPI Creatinine Equation (2021)    Anion gap 8 5 - 15     Comment: Performed at [facility], [address], [place], [place] [address]  Lipid panel     Status: Abnormal   Collection Time: 07/26/21  6:53 AM  Result Value Ref Range   Cholesterol 198 0 - 200 mg/dL   Triglycerides 440 (H) <150 mg/dL   HDL 52 >10 mg/dL   Total CHOL/HDL Ratio 3.8 RATIO   VLDL 42 (H) 0 - 40 mg/dL   LDL Cholesterol 272 (H) 0 - 99 mg/dL    Comment:        Total Cholesterol/HDL:CHD Risk Coronary Heart Disease Risk Table                     Men   Women  1/2 Average Risk   3.4   3.3  Average Risk       5.0   4.4  2 X Average Risk   9.6   7.1  3 X Average Risk  23.4   11.0        Use the calculated Patient Ratio above and the CHD Risk Table to determine the patient's CHD Risk.        ATP III CLASSIFICATION (LDL):  <100     mg/dL   Optimal  536-644  mg/dL   Near or Above  Optimal  130-159  mg/dL   Borderline  619-509  mg/dL   High  >326     mg/dL   Very High Performed at [facility], [address] [place], [place] [address]   Hepatic function panel     Status: Abnormal   Collection Time: 07/26/21  6:53 AM  Result Value Ref Range   Total Protein 7.5 6.5 - 8.1 g/dL   Albumin 4.0 3.5 - 5.0 g/dL   AST 35 15 - 41 U/L   ALT 24 0 - 44 U/L   Alkaline Phosphatase 75 38 - 126 U/L   Total Bilirubin 1.7 (H) 0.3 - 1.2 mg/dL   Bilirubin, Direct 0.3 (H) 0.0 - 0.2 mg/dL   Indirect Bilirubin 1.4 (H) 0.3 - 0.9 mg/dL    Comment: Performed at [facility], [address] [place], [place] [address]  Hemoglobin A1c     Status: Abnormal   Collection Time: 07/26/21  6:53 AM  Result Value Ref Range   Hgb A1c MFr Bld 4.3 (L) 4.8 - 5.6 %    Comment: (NOTE) Pre diabetes:          5.7%-6.4%  Diabetes:              >6.4%  Glycemic control for   <7.0% adults with diabetes    Mean Plasma Glucose 76.71 mg/dL    Comment: Performed at [facility], [address], [place], [place] [address]   CBC with Differential/Platelet     Status: Abnormal   Collection Time: 07/26/21  6:28 PM  Result Value Ref Range   WBC 3.1 (L) 4.0 - 10.5 K/uL   RBC 4.03 (L) 4.22 - 5.81 MIL/uL   Hemoglobin 13.7 13.0 - 17.0 g/dL   HCT 05.3 97.6 - 73.4 %   MCV 102.2 (H) 80.0 - 100.0 fL   MCH 34.0 26.0 - 34.0 pg   MCHC 33.3 30.0 - 36.0 g/dL   RDW 19.3 (H) 79.0 - 24.0 %   Platelets 133 (L) 150 - 400 K/uL   nRBC 0.0 0.0 - 0.2 %   Neutrophils Relative % 68 %   Neutro Abs 2.1 1.7 - 7.7 K/uL   Lymphocytes Relative 22 %   Lymphs Abs 0.7 0.7 - 4.0 K/uL   Monocytes Relative 6 %   Monocytes Absolute 0.2 0.1 - 1.0 K/uL   Eosinophils Relative 3 %   Eosinophils Absolute 0.1 0.0 - 0.5 K/uL   Basophils Relative 1 %   Basophils Absolute 0.0 0.0 - 0.1 K/uL   Immature Granulocytes 0 %   Abs Immature Granulocytes 0.01 0.00 - 0.07 K/uL    Comment: Performed at [facility], [address] [place], [place] [address]  Comprehensive metabolic panel     Status: Abnormal   Collection Time: 07/26/21  6:28 PM  Result Value Ref Range   Sodium 138 135 - 145 mmol/L   Potassium 3.5 3.5 - 5.1 mmol/L   Chloride 101 98 - 111 mmol/L   CO2 28 22 - 32 mmol/L   Glucose, Bld 102 (H) 70 - 99 mg/dL    Comment: Glucose reference range applies only to samples taken after fasting for at least 8 hours.   BUN 9 6 - 20 mg/dL   Creatinine, Ser 2.99 0.61 - 1.24 mg/dL   Calcium 9.9 8.9 - 24.2 mg/dL   Total Protein 7.6 6.5 - 8.1 g/dL   Albumin 4.3 3.5 - 5.0 g/dL  AST 37 15 - 41 U/L   ALT 23 0 - 44 U/L   Alkaline Phosphatase 74 38 - 126 U/L   Total Bilirubin 1.6 (H) 0.3 - 1.2 mg/dL   GFR, Estimated >96 >04 mL/min    Comment: (NOTE) Calculated using the CKD-EPI Creatinine Equation (2021)    Anion gap 9 5 - 15    Comment: Performed at [facility], [address], [place], [place] [address]  Magnesium     Status: Abnormal   Collection Time: 07/26/21  6:28 PM  Result Value Ref Range   Magnesium  1.6 (L) 1.7 - 2.4 mg/dL    Comment: Performed at [facility], [address], [place], [place] [address]  TSH     Status: None   Collection Time: 07/26/21  6:28 PM  Result Value Ref Range   TSH 2.079 0.350 - 4.500 uIU/mL    Comment: Performed by a 3rd Generation assay with a functional sensitivity of <=0.01 uIU/mL. Performed at [facility], [address] [place], [place] [address]     Blood Alcohol level:  Lab Results  Component Value Date   ETH 231 (H) 07/24/2021   ETH 308 (HH) 07/20/2021    Metabolic Disorder Labs: Lab Results  Component Value Date   HGBA1C 4.3 (L) 07/26/2021   MPG 76.71 07/26/2021   MPG 99.67 03/12/2021   No results found for: PROLACTIN Lab Results  Component Value Date   CHOL 198 07/26/2021   TRIG 209 (H) 07/26/2021   HDL 52 07/26/2021   CHOLHDL 3.8 07/26/2021   VLDL 42 (H) 07/26/2021   LDLCALC 104 (H) 07/26/2021   LDLCALC 79 09/19/2019    Physical Findings: CIWA:  CIWA-Ar Total: 8   Musculoskeletal: Strength & Muscle Tone: Fair Gait & Station: slow but steady- ambulates without assistance Patient leans: N/A  Psychiatric Specialty Exam:  Presentation  General Appearance: Disheveled (in scrubs)  Eye Contact:Fair  Speech:Clear and Coherent; Slow  Speech Volume:Normal  Handedness:Right   Mood and Affect  Mood:Dysphoric, irritable  Affect:Congruent   Thought Process  Thought Processes:Coherent; Goal Directed  Descriptions of Associations:Intact  Orientation:Full (Time, Place and Person)  Thought Content:Logical; WDL- no evidence of acute psychosis, delusions or paranoia  History of Schizophrenia/Schizoaffective disorder:No  Duration of Psychotic Symptoms:Less than six months  Hallucinations:Hallucinations: None  Ideas of Reference:None  Suicidal Thoughts:Suicidal Thoughts: No  Homicidal Thoughts:Homicidal Thoughts: No   Sensorium  Memory:Immediate Fair; Recent  Fair  Judgment:Poor  Insight:Poor   Executive Functions  Concentration:Good  Attention Span:Good  Recall:Fair  Fund of Knowledge:Fair  Language:Good   Psychomotor Activity  Psychomotor Activity:Decreased secondary to reported dizziness   Assets  Assets:Communication Skills; Resilience   Sleep  Sleep:Sleep: Fair Number of Hours of Sleep: 5    Physical Exam: Physical Exam Vitals and nursing note reviewed.  Constitutional:      General: He is not in acute distress.    Appearance: He is ill-appearing. He is not toxic-appearing.  HENT:     Head: Normocephalic and atraumatic.  Pulmonary:     Effort: Pulmonary effort is normal.  Musculoskeletal:        General: Normal range of motion.  Neurological:     General: No focal deficit present.     Mental Status: He is alert.   Review of Systems  Respiratory:  Negative for cough and shortness of breath.   Cardiovascular:  Positive for chest pain (mild, from vomiting).  Gastrointestinal:  Positive for diarrhea,  nausea and vomiting. Negative for abdominal pain and constipation.  Neurological:  Negative for weakness and headaches.  Psychiatric/Behavioral:  Positive for substance abuse. Negative for depression, hallucinations and suicidal ideas. The patient is nervous/anxious.   Blood pressure 107/72, pulse (!) 117, temperature 98.3 F (36.8 C), temperature source Oral, resp. rate 16, height 6\' 1"  (1.854 m), weight 72.6 kg, SpO2 100 %. Body mass index is 21.11 kg/m.   Treatment Plan Summary: Daily contact with patient to assess and evaluate symptoms and progress in treatment  Gary [name] is a 35 yr old male who presents with SI and EtOH abuse.  PPHx is significant for MDD, Recurrent, Severe, severe EtOH abuse, multiple ED visits and rehab stays.   EKG shows NSR with a QTC of 442. He reports he is will to take the Kdur and Mag-OX will recheck levels tomorrow morning.  He continues to decline medications for depression  despite discussing the benefits.  He declines SAIOP or residential rehab at this time. Will have him moved closer to the nurses station and encouraged increased fluid intake.  Orthostatic vitals where taken and he is not orthostatic.  Will not make any other medication changes at this time.  We will continue to monitor.    Alcohol induced mood d/o (r/o MDD by hx) -Refusing antidepressant trial at this time - Would benefit from [facility] or residential rehab but currently refusing - TSH 2.079   EtOH use d/o - severe dependence: -Continue CIWA, last score 7 -Continue Gabapentin 100 mg TID for potential cravings/withdrawal -Continue scheduled Ativan taper -Continue Ativan 1 mg q6 PRN CIWA>10 -Continue Imodium 2-4 mg PRN -Continue Zofran-ODT 4 mg q6 PRN -Continue Thiamine 100 mg daily -Continue Folic Acid 1 mg daily -Continue Multivitamin daily -Continue Protonix 20 mg BID   Hypokalemia/Hypomagnesemia: -K-dur 40 mEq once -Mag-Ox 400 mg once -Redraw BMP and Mag tomorrow after repletion - Ensure bid and increased po intake encouraged - EKG NSR 78bpm with QTC [facility]  HIV  Leukopenia -Absol Neut up to  2.1 on recheck with WBC up to 3.1 - rechecking tomorrow for trending -CD4 and viral load drawn -Continue Biktarvy 1 tablet daily after lunch -Can stop Neutropenic precautions but stressed need for f/u with ID after discharge   -Continue PRN's: Tylenol, Maalox, Atarax, Milk of Magnesia, Trazodone   , MD 07/27/2021, 12:02 PM

## 2021-07-27 NOTE — Progress Notes (Signed)
EKG results placed on the outside of shadow chart  Normal Sinus Rhythm Normal EKG  QT/QTC= 388/442 ms

## 2021-07-27 NOTE — Progress Notes (Signed)
[facility] Group Notes:  (Nursing/MHT/Case Management/Adjunct)  Date:  07/27/2021  Time:  2000  Type of Therapy:   wrap up group  Participation Level:  Active  Participation Quality:  Appropriate, Attentive, Sharing, and Supportive  Affect:  Resistant  Cognitive:  Alert  Insight:  Improving  Engagement in Group:  Developing/Improving  Modes of Intervention:  Clarification, Education, and Support  Summary of Progress/Problems: Positive thinking and positive change were discussed.   Marcille Buffy 07/27/2021, 8:41 PM

## 2021-07-28 ENCOUNTER — Encounter (HOSPITAL_COMMUNITY): Payer: Self-pay

## 2021-07-28 LAB — CBC WITH DIFFERENTIAL/PLATELET
Abs Immature Granulocytes: 0.01 10*3/uL (ref 0.00–0.07)
Basophils Absolute: 0 10*3/uL (ref 0.0–0.1)
Basophils Relative: 1 %
Eosinophils Absolute: 0.1 10*3/uL (ref 0.0–0.5)
Eosinophils Relative: 3 %
HCT: 40 % (ref 39.0–52.0)
Hemoglobin: 13.3 g/dL (ref 13.0–17.0)
Immature Granulocytes: 0 %
Lymphocytes Relative: 28 %
Lymphs Abs: 1 10*3/uL (ref 0.7–4.0)
MCH: 34.4 pg — ABNORMAL HIGH (ref 26.0–34.0)
MCHC: 33.3 g/dL (ref 30.0–36.0)
MCV: 103.4 fL — ABNORMAL HIGH (ref 80.0–100.0)
Monocytes Absolute: 0.3 10*3/uL (ref 0.1–1.0)
Monocytes Relative: 9 %
Neutro Abs: 2.1 10*3/uL (ref 1.7–7.7)
Neutrophils Relative %: 59 %
Platelets: 140 10*3/uL — ABNORMAL LOW (ref 150–400)
RBC: 3.87 MIL/uL — ABNORMAL LOW (ref 4.22–5.81)
RDW: 17.2 % — ABNORMAL HIGH (ref 11.5–15.5)
WBC: 3.6 10*3/uL — ABNORMAL LOW (ref 4.0–10.5)
nRBC: 0 % (ref 0.0–0.2)

## 2021-07-28 LAB — BASIC METABOLIC PANEL
Anion gap: 9 (ref 5–15)
BUN: 11 mg/dL (ref 6–20)
CO2: 26 mmol/L (ref 22–32)
Calcium: 9.2 mg/dL (ref 8.9–10.3)
Chloride: 99 mmol/L (ref 98–111)
Creatinine, Ser: 0.74 mg/dL (ref 0.61–1.24)
GFR, Estimated: >60 mL/min (ref >60)
Glucose, Bld: 96 mg/dL (ref 70–99)
Potassium: 4.1 mmol/L (ref 3.5–5.1)
Sodium: 134 mmol/L — ABNORMAL LOW (ref 135–145)

## 2021-07-28 LAB — HIV-1 RNA QUANT-NO REFLEX-BLD

## 2021-07-28 LAB — MAGNESIUM: Magnesium: 2.1 mg/dL (ref 1.7–2.4)

## 2021-07-28 MED ORDER — PROMETHAZINE HCL 25 MG PO TABS
12.5000 mg | ORAL_TABLET | Freq: Three times a day (TID) | ORAL | Status: DC | PRN
  Administered 2021-07-28 – 2021-07-29 (×2): 12.5 mg via ORAL
  Filled 2021-07-28 (×2): qty 1

## 2021-07-28 MED ORDER — TRAZODONE HCL 50 MG PO TABS
50.0000 mg | ORAL_TABLET | Freq: Every evening | ORAL | Status: DC | PRN
  Administered 2021-07-28 – 2021-07-30 (×3): 50 mg via ORAL
  Filled 2021-07-28 (×9): qty 1

## 2021-07-28 MED ORDER — GABAPENTIN 300 MG PO CAPS
300.0000 mg | ORAL_CAPSULE | Freq: Three times a day (TID) | ORAL | Status: DC
  Administered 2021-07-28 – 2021-07-31 (×9): 300 mg via ORAL
  Filled 2021-07-28 (×3): qty 1
  Filled 2021-07-28 (×2): qty 21
  Filled 2021-07-28 (×5): qty 1
  Filled 2021-07-28: qty 21
  Filled 2021-07-28 (×2): qty 1
  Filled 2021-07-28: qty 21
  Filled 2021-07-28 (×2): qty 1

## 2021-07-28 MED ORDER — ONDANSETRON 4 MG PO TBDP: 8.0000 mg | ORAL_TABLET | Freq: Three times a day (TID) | ORAL | Status: DC | PRN

## 2021-07-28 NOTE — Progress Notes (Addendum)
[facility] MD Progress Note  07/28/2021 9:46 AM Gary [name]  MRN:  [id number] Subjective:   Gary [name] is a 35 yr old male who presents with SI and EtOH abuse.  PPHx is significant for MDD, Recurrent, Severe, severe EtOH abuse, multiple ED visits and rehab stays.  Case was discussed in the multidisciplinary team. MAR was reviewed and patient has been has been compliant with medications.  Patient received as needed Imodium and hydroxyzine yesterday.  Today on 07/28/2021 Patient is seen today by me, Dr. Mason Jim, and Dr. Loleta Chance.  Patient states his mood is good.  He complains of nausea but is able to hold his food down this morning.  Patient ate sausage and eggs.  He denies any vomiting.  He states that he had bad diarrhea yesterday but Imodium helped him. He does not report any other withdrawal symptoms other than nausea.  He denies having any tremors.  Patient is worried that he is not able to eat.  Discussed about increasing Zofran to 8 mg for nausea.  Patient verbalizes understanding. Patient states he would like to go back to work soon after completing detox.  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  He denies any SI, HI, AVH.  Patient slept well last night.  He denies any side effects from medications.  He is still refusing treatment for depression and not interested in going to substance abuse rehab.  States he is planning to go to [employer] and has better friends that can help him set up with AA.  Discussed about increasing Neurontin for cravings.  Patient agrees with the plan.  Patient denies headache, vomiting, diarrhea, constipation, abdominal pain, headache, dizziness, cough, chest pain. Patient denies any concerns.  Principal Problem: Alcohol-induced mood disorder (HCC) Diagnosis: Principal Problem:   Alcohol-induced mood disorder (HCC) Active Problems:   HIV disease (HCC)   Alcohol use disorder, severe, dependence (HCC)   Thrombocytopenia (HCC)   Neutropenia (HCC)  Total Time spent with  patient: 30 minutes  Past Psychiatric History: MDD, Recurrent, Severe, severe EtOH abuse, multiple ED visits and rehab stays  Past Medical History:  Past Medical History:  Diagnosis Date   Alcohol abuse    HIV (human immunodeficiency virus infection) (HCC)    Seizures (HCC)    Subdural hematoma (HCC)     Past Surgical History:  Procedure Laterality Date   INCISION AND DRAINAGE PERIRECTAL ABSCESS N/A 09/24/2016   Procedure: IRRIGATION AND DEBRIDEMENT PERIRECTAL ABSCESS;  Surgeon: Ricarda Frame, MD;  Location: [facility] ORS;  Service: General;  Laterality: N/A;   none     Family History:  Family History  Problem Relation Age of Onset   Alcohol abuse Mother    Alcohol abuse Father    Family Psychiatric  History: Brother- Bipolar Disorder and EtOH abuse Social History:  Social History   Substance and Sexual Activity  Alcohol Use Yes   Comment: "I drink all I can"     Social History   Substance and Sexual Activity  Drug Use Yes   Types: Methamphetamines, Marijuana   Comment: last used 04/10/2019    Social History   Socioeconomic History   Marital status: Single    Spouse name: Not on file   Number of children: Not on file   Years of education: Not on file   Highest education level: Not on file  Occupational History   Occupation: unemployed  Tobacco Use   Smoking status: Every Day    Packs/day: 0.50    Types: Cigarettes  Smokeless tobacco: Never   Tobacco comments:    unable to smoke while incarcerated 6+ months 02/13/20  Vaping Use   Vaping Use: Never used  Substance and Sexual Activity   Alcohol use: Yes    Comment: "I drink all I can"   Drug use: Yes    Types: Methamphetamines, Marijuana    Comment: last used 04/10/2019   Sexual activity: Yes    Partners: Male, Male    Comment: declined condoms  03/2021  Other Topics Concern   Not on file  Social History Narrative   "Currently living on the streets"   Independent at baseline   Social Determinants of  Health   Financial Resource Strain: Not on file  Food Insecurity: Not on file  Transportation Needs: Not on file  Physical Activity: Not on file  Stress: Not on file  Social Connections: Not on file    Sleep: Fair  Appetite:  Poor  Current Medications: Current Facility-Administered Medications  Medication Dose Route Frequency Provider Last Rate Last Admin   bictegravir-emtricitabine-tenofovir AF (BIKTARVY) 50-200-25 MG per tablet 1 tablet  1 tablet Oral QPC lunch Leevy-Johnson, Brooke A, NP   1 tablet at 07/27/21 1241   feeding supplement (ENSURE ENLIVE / ENSURE PLUS) liquid 237 mL  237 mL Oral BID BM Bobbitt, Shalon E, NP   237 mL at 07/27/21 1734   folic acid (FOLVITE) tablet 1 mg  1 mg Oral Daily Leevy-Johnson, Brooke A, NP   1 mg at 07/28/21 0802   gabapentin (NEURONTIN) capsule 100 mg  100 mg Oral TID Lauro Franklin, MD   100 mg at 07/28/21 0802   hydrOXYzine (ATARAX/VISTARIL) tablet 25 mg  25 mg Oral Q6H PRN Bobbitt, Shalon E, NP   25 mg at 07/27/21 2203   loperamide (IMODIUM) capsule 2-4 mg  2-4 mg Oral PRN Bobbitt, Shalon E, NP   2 mg at 07/27/21 1108   LORazepam (ATIVAN) tablet 1 mg  1 mg Oral Q6H PRN Bobbitt, Shalon E, NP   1 mg at 07/26/21 2113   LORazepam (ATIVAN) tablet 1 mg  1 mg Oral BID Bobbitt, Shalon E, NP   1 mg at 07/28/21 0806   Followed by   Melene Muller ON 07/29/2021] LORazepam (ATIVAN) tablet 1 mg  1 mg Oral Daily Bobbitt, Shalon E, NP       multivitamin with minerals tablet 1 tablet  1 tablet Oral Daily Bobbitt, Shalon E, NP   1 tablet at 07/28/21 0803   ondansetron (ZOFRAN-ODT) disintegrating tablet 8 mg  8 mg Oral Q8H PRN Mason Jim, Darrien Belter E, MD       pantoprazole (PROTONIX) EC tablet 20 mg  20 mg Oral BID Leevy-Johnson, Brooke A, NP   20 mg at 07/28/21 0802   potassium chloride SA (KLOR-CON) CR tablet 40 mEq  40 mEq Oral Once Lauro Franklin, MD       thiamine tablet 100 mg  100 mg Oral Daily Bobbitt, Shalon E, NP   100 mg at 07/28/21 0802    Lab  Results:  Results for orders placed or performed during the hospital encounter of 07/26/21 (from the past 48 hour(s))  CBC with Differential/Platelet     Status: Abnormal   Collection Time: 07/26/21  6:28 PM  Result Value Ref Range   WBC 3.1 (L) 4.0 - 10.5 K/uL   RBC 4.03 (L) 4.22 - 5.81 MIL/uL   Hemoglobin 13.7 13.0 - 17.0 g/dL   HCT 76.5 46.5 - 03.5 %  MCV 102.2 (H) 80.0 - 100.0 fL   MCH 34.0 26.0 - 34.0 pg   MCHC 33.3 30.0 - 36.0 g/dL   RDW 18.8 (H) 41.6 - 60.6 %   Platelets 133 (L) 150 - 400 K/uL   nRBC 0.0 0.0 - 0.2 %   Neutrophils Relative % 68 %   Neutro Abs 2.1 1.7 - 7.7 K/uL   Lymphocytes Relative 22 %   Lymphs Abs 0.7 0.7 - 4.0 K/uL   Monocytes Relative 6 %   Monocytes Absolute 0.2 0.1 - 1.0 K/uL   Eosinophils Relative 3 %   Eosinophils Absolute 0.1 0.0 - 0.5 K/uL   Basophils Relative 1 %   Basophils Absolute 0.0 0.0 - 0.1 K/uL   Immature Granulocytes 0 %   Abs Immature Granulocytes 0.01 0.00 - 0.07 K/uL    Comment: Performed at [facility], [address], [place], [place] [address]  Comprehensive metabolic panel     Status: Abnormal   Collection Time: 07/26/21  6:28 PM  Result Value Ref Range   Sodium 138 135 - 145 mmol/L   Potassium 3.5 3.5 - 5.1 mmol/L   Chloride 101 98 - 111 mmol/L   CO2 28 22 - 32 mmol/L   Glucose, Bld 102 (H) 70 - 99 mg/dL    Comment: Glucose reference range applies only to samples taken after fasting for at least 8 hours.   BUN 9 6 - 20 mg/dL   Creatinine, Ser 1.09 0.61 - 1.24 mg/dL   Calcium 9.9 8.9 - 32.3 mg/dL   Total Protein 7.6 6.5 - 8.1 g/dL   Albumin 4.3 3.5 - 5.0 g/dL   AST 37 15 - 41 U/L   ALT 23 0 - 44 U/L   Alkaline Phosphatase 74 38 - 126 U/L   Total Bilirubin 1.6 (H) 0.3 - 1.2 mg/dL   GFR, Estimated >55 >73 mL/min    Comment: (NOTE) Calculated using the CKD-EPI Creatinine Equation (2021)    Anion gap 9 5 - 15    Comment: Performed at [facility], [address]  [place], [place] [address]  Magnesium     Status: Abnormal   Collection Time: 07/26/21  6:28 PM  Result Value Ref Range   Magnesium 1.6 (L) 1.7 - 2.4 mg/dL    Comment: Performed at [facility], [address], [place], [place] [address]  TSH     Status: None   Collection Time: 07/26/21  6:28 PM  Result Value Ref Range   TSH 2.079 0.350 - 4.500 uIU/mL    Comment: Performed by a 3rd Generation assay with a functional sensitivity of <=0.01 uIU/mL. Performed at [facility], [address] [place], [place] [address]     Blood Alcohol level:  Lab Results  Component Value Date   ETH 231 (H) 07/24/2021   ETH 308 (HH) 07/20/2021    Metabolic Disorder Labs: Lab Results  Component Value Date   HGBA1C 4.3 (L) 07/26/2021   MPG 76.71 07/26/2021   MPG 99.67 03/12/2021   No results found for: PROLACTIN Lab Results  Component Value Date   CHOL 198 07/26/2021   TRIG 209 (H) 07/26/2021   HDL 52 07/26/2021   CHOLHDL 3.8 07/26/2021   VLDL 42 (H) 07/26/2021   LDLCALC 104 (H) 07/26/2021   LDLCALC 79 09/19/2019    Physical Findings: CIWA:  CIWA-Ar Total: 3   Musculoskeletal: Strength & Muscle Tone: Fair Gait & Station: slow but steady-  ambulates without assistance Patient leans: N/A  Psychiatric Specialty Exam:  Presentation  General Appearance: Disheveled (in scrubs)  Eye Contact:Fair  Speech:Clear and Coherent; Slow  Speech Volume:Normal  Handedness:Right   Mood and Affect  Mood:Dysphoric , irritable  Affect:Congruent   Thought Process  Thought Processes:Coherent; Goal Directed  Descriptions of Associations:Intact  Orientation:Full (Time, Place and Person)  Thought Content:Logical; WDL - no evidence of acute psychosis, delusions or paranoia  History of Schizophrenia/Schizoaffective disorder:No  Duration of Psychotic Symptoms:Less than six months  Hallucinations:Hallucinations: None  Ideas of Reference:None  Suicidal  Thoughts:Suicidal Thoughts: No  Homicidal Thoughts:Homicidal Thoughts: No   Sensorium  Memory:Immediate Fair; Recent Fair  Judgment:Poor  Insight:Poor   Executive Functions  Concentration:Good  Attention Span:Good  Recall:Fair  Fund of Knowledge:Fair  Language:Good   Psychomotor Activity  Psychomotor Activity:Normal   Assets  Assets:Communication Skills; Resilience   Sleep  Sleep:Sleep: Fair Number of Hours of Sleep: 5    Physical Exam: Physical Exam Vitals and nursing note reviewed.  Constitutional:      General: He is not in acute distress.    Appearance: He is ill-appearing. He is not toxic-appearing.  HENT:     Head: Normocephalic and atraumatic.  Pulmonary:     Effort: Pulmonary effort is normal.  Musculoskeletal:        General: Normal range of motion.  Neurological:     General: No focal deficit present.     Mental Status: He is alert.   Review of Systems  Respiratory:  Negative for cough and shortness of breath.   Cardiovascular:  Positive for chest pain (mild, from vomiting).  Gastrointestinal:  Positive for nausea. Negative for abdominal pain, constipation, diarrhea and vomiting.  Neurological:  Negative for weakness and headaches.  Psychiatric/Behavioral:  Positive for substance abuse. Negative for depression, hallucinations and suicidal ideas. The patient is nervous/anxious.   Blood pressure 111/74, pulse (!) 101, temperature 98.1 F (36.7 C), temperature source Oral, resp. rate 16, height 6\' 1"  (1.854 m), weight 72.6 kg, SpO2 100 %. Body mass index is 21.11 kg/m.   Treatment Plan Summary: Daily contact with patient to assess and evaluate symptoms and progress in treatment  Mr. Halling is a 35 yr old male who presents with SI and EtOH abuse.  PPHx is significant for MDD, Recurrent, Severe, severe EtOH abuse, multiple ED visits and rehab stays.   EKG shows NSR with a QTC of 442.  His potassium was low over the weekend at 3.3.  Repeat  potassium on 9/17 showed potassium 3.5.  He continues to decline medications for depression despite discussing the benefits.  He declines SAIOP or residential rehab at this time.  Patient still reporting nausea which did not get better with 4 mg Zofran.  Zofran was increased to 8 mg today.  He agrees to increasing Neurontin to help with cravings. Will increase Neurontin to 300 mg 3 times daily.    Alcohol induced mood d/o (r/o MDD by hx) -Refusing antidepressant trial at this time - Would benefit from [facility] or residential rehab but currently refusing - TSH 2.079   EtOH use d/o - severe dependence: -Continue CIWA, last score 3 -Increase gabapentin to 300 mg TID for potential cravings/withdrawal -Continue scheduled Ativan taper to be finished on 9/20 -Continue Ativan 1 mg q6 PRN CIWA>10 -Continue Imodium 2-4 mg PRN -Increase Zofran-ODT to 8 mg q6 PRN -Continue Thiamine 100 mg daily -Continue Folic Acid 1 mg daily -Continue Multivitamin daily -Continue Protonix 20 mg BID   Hypokalemia/Hypomagnesemia: -  Patient refused K-dur 40 meq, but received-Mag-Ox 400 mg once -Repeat BMP and Mag today-pending - Ensure bid and increased po intake encouraged - EKG NSR 78bpm with QTC  HIV  Leukopenia -Absol Neut up to  2.1 on recheck with WBC up to 3.1 - rechecking today for trending -CD4 and viral load drawn-results pending -Continue Biktarvy 1 tablet daily after lunch -Can stop Neutropenic precautions but stressed need for f/u with ID after discharge  -Continue PRN's: Tylenol, Maalox, Atarax, Milk of Magnesia, Trazodone   Karsten Ro, MD 07/28/2021, 9:46 AM

## 2021-07-28 NOTE — Progress Notes (Signed)
   07/27/21 2210  Psychosocial Assessment  Patient Complaints Substance abuse  Eye Contact Brief  Facial Expression Anxious  Affect Appropriate to circumstance  Speech Logical/coherent  Interaction Assertive  Motor Activity Slow;Unsteady  Appearance/Hygiene Disheveled  Behavior Characteristics Cooperative;Appropriate to situation  Mood Depressed;Anxious;Pleasant  Aggressive Behavior  Effect No apparent injury  Thought Process  Coherency WDL  Content WDL  Delusions None reported or observed  Perception Hallucinations  Hallucination Auditory;Visual  Judgment Poor  Confusion None  Danger to Self  Current suicidal ideation? Passive  Self-Injurious Behavior No self-injurious ideation or behavior indicators observed or expressed   Agreement Not to Harm Self Yes  Description of Agreement verbal contract for safety  Danger to Others  Danger to Others None reported or observed

## 2021-07-28 NOTE — Progress Notes (Signed)
[facility] [phone] Labs scheduled to be drawn 6:00 pm today 07/28/2021.  No labs were drawn this morning.

## 2021-07-28 NOTE — Progress Notes (Signed)
Patient did attend the evening speaker AA meeting.  

## 2021-07-28 NOTE — Plan of Care (Signed)
Nurse discussed coping skills with patient.  

## 2021-07-28 NOTE — Progress Notes (Signed)
D:  Patient's self inventory sheet, patient had poor sleep last night, no sleep medication.  Poor appetite, normal energy level, good concentration.  Denied depression and hopeless, anxiety #3.  Withdrawals, runny nose, nausea, chilling, tremors.  Denied SI, contracts for safety.  Denied physical problems.  Denied physical pain.  Goal is discharge.  Plans to talk to MD.  Needs phenegran to eat.  Does have discharge plans. A:  Medications administered per MD orders.  Emotional support and encouragement given patient. R:  Denied SI and HI, contracts for safety.  Denied A/V hallucinations.  Safety maintained with 15 minute checks. \

## 2021-07-28 NOTE — Progress Notes (Signed)
Nutrition Brief Note RD working remotely.   Patient identified on the Malnutrition Screening Tool (MST) Report  Wt Readings from Last 15 Encounters:  07/26/21 72.6 kg  07/20/21 72.6 kg  07/07/21 80 kg  07/03/21 72.6 kg  07/03/21 72.6 kg  06/28/21 72.6 kg  06/21/21 72.6 kg  04/30/21 72.1 kg  04/09/21 72.1 kg  03/20/21 72.1 kg  03/19/21 72.1 kg  03/06/21 70 kg  02/21/21 70 kg  01/12/21 70 kg  01/08/21 70 kg    Body mass index is 21.11 kg/m. Patient meets criteria for normal weight based on current BMI. Skin WDL.   He was admitted with alcohol-induced mood disorder.   Current diet order is Regular and patient is eating as desired for meals, supplements, and snacks at this time. Labs and medications reviewed.   Ensure Plus was ordered BID on 9/17 and he has accepted this supplement 60% of the time offered. No additional nutrition interventions warranted at this time. If nutrition issues arise, please consult RD.       Gary Gammon, MS, RD, LDN, CNSC Inpatient Clinical Dietitian RD pager # available in [facility]  After hours/weekend pager # available in [facility]

## 2021-07-28 NOTE — BH IP Treatment Plan (Signed)
Interdisciplinary Treatment and Diagnostic Plan Update  07/28/2021 Time of Session: 10:10am HAYGEN ZEBROWSKI MRN: [id number]  Principal Diagnosis: Alcohol-induced mood disorder ([place])  Secondary Diagnoses: Principal Problem:   Alcohol-induced mood disorder ([place]) Active Problems:   HIV disease ([place])   Alcohol use disorder, severe, dependence ([place])   Thrombocytopenia ([place])   Neutropenia ([place])   Current Medications:  Current Facility-Administered Medications  Medication Dose Route Frequency Provider Last Rate Last Admin   bictegravir-emtricitabine-tenofovir AF (BIKTARVY) 50-200-25 MG per tablet 1 tablet  1 tablet Oral QPC lunch Leevy-Johnson, Brooke A, NP   1 tablet at 07/27/21 1241   feeding supplement (ENSURE ENLIVE / ENSURE PLUS) liquid 237 mL  237 mL Oral BID BM Bobbitt, Shalon E, NP   237 mL at 82/99/37 1696   folic acid (FOLVITE) tablet 1 mg  1 mg Oral Daily Leevy-Johnson, Brooke A, NP   1 mg at 07/28/21 0802   gabapentin (NEURONTIN) capsule 100 mg  100 mg Oral TID Briant Cedar, MD   100 mg at 07/28/21 0802   hydrOXYzine (ATARAX/VISTARIL) tablet 25 mg  25 mg Oral Q6H PRN Bobbitt, Shalon E, NP   25 mg at 07/27/21 2203   loperamide (IMODIUM) capsule 2-4 mg  2-4 mg Oral PRN Bobbitt, Shalon E, NP   2 mg at 07/27/21 1108   LORazepam (ATIVAN) tablet 1 mg  1 mg Oral Q6H PRN Bobbitt, Shalon E, NP   1 mg at 07/26/21 2113   LORazepam (ATIVAN) tablet 1 mg  1 mg Oral BID Bobbitt, Shalon E, NP   1 mg at 07/28/21 0806   Followed by   Derrill Memo ON 07/29/2021] LORazepam (ATIVAN) tablet 1 mg  1 mg Oral Daily Bobbitt, Shalon E, NP       multivitamin with minerals tablet 1 tablet  1 tablet Oral Daily Bobbitt, Shalon E, NP   1 tablet at 07/28/21 0803   ondansetron (ZOFRAN-ODT) disintegrating tablet 8 mg  8 mg Oral Q8H PRN Harlow Asa, MD       pantoprazole (PROTONIX) EC tablet 20 mg  20 mg Oral BID Leevy-Johnson, Brooke A, NP   20 mg at 07/28/21 0802   potassium chloride SA (KLOR-CON) CR  tablet 40 mEq  40 mEq Oral Once Briant Cedar, MD       thiamine tablet 100 mg  100 mg Oral Daily Bobbitt, Shalon E, NP   100 mg at 07/28/21 0802   PTA Medications: Medications Prior to Admission  Medication Sig Dispense Refill Last Dose   chlordiazePOXIDE (LIBRIUM) 25 MG capsule 16m PO TID x 1D, then 25-566mPO BID X 1D, then 25-5051mO QD X 1D (Patient not taking: No sig reported) 10 capsule 0    ondansetron (ZOFRAN ODT) 4 MG disintegrating tablet Take 1 tablet (4 mg total) by mouth every 8 (eight) hours as needed for nausea or vomiting. (Patient not taking: No sig reported) 20 tablet 0     Patient Stressors: Legal issue   Marital or family conflict   Substance abuse    Patient Strengths: General fund of knowledge  Motivation for treatment/growth   Treatment Modalities: Medication Management, Group therapy, Case management,  1 to 1 session with clinician, Psychoeducation, Recreational therapy.   Physician Treatment Plan for Primary Diagnosis: Alcohol-induced mood disorder ([place] Term Goal(s): Improvement in symptoms so as ready for discharge   Short Term Goals: Ability to identify changes in lifestyle to reduce recurrence of condition will improve Ability to verbalize feelings will improve  Ability to disclose and discuss suicidal ideas Ability to demonstrate self-control will improve Ability to identify and develop effective coping behaviors will improve Ability to maintain clinical measurements within normal limits will improve Ability to identify triggers associated with substance abuse/mental health issues will improve  Medication Management: Evaluate patient's response, side effects, and tolerance of medication regimen.  Therapeutic Interventions: 1 to 1 sessions, Unit Group sessions and Medication administration.  Evaluation of Outcomes: Progressing  Physician Treatment Plan for Secondary Diagnosis: Principal Problem:   Alcohol-induced mood disorder  ([place]) Active Problems:   HIV disease ([place])   Alcohol use disorder, severe, dependence ([place])   Thrombocytopenia ([place])   Neutropenia ([place])  Long Term Goal(s): Improvement in symptoms so as ready for discharge   Short Term Goals: Ability to identify changes in lifestyle to reduce recurrence of condition will improve Ability to verbalize feelings will improve Ability to disclose and discuss suicidal ideas Ability to demonstrate self-control will improve Ability to identify and develop effective coping behaviors will improve Ability to maintain clinical measurements within normal limits will improve Ability to identify triggers associated with substance abuse/mental health issues will improve     Medication Management: Evaluate patient's response, side effects, and tolerance of medication regimen.  Therapeutic Interventions: 1 to 1 sessions, Unit Group sessions and Medication administration.  Evaluation of Outcomes: Not Met   RN Treatment Plan for Primary Diagnosis: Alcohol-induced mood disorder ([place]) Long Term Goal(s): Knowledge of disease and therapeutic regimen to maintain health will improve  Short Term Goals: Ability to demonstrate self-control, Ability to participate in decision making will improve, Ability to verbalize feelings will improve, Ability to disclose and discuss suicidal ideas, and Ability to identify and develop effective coping behaviors will improve  Medication Management: RN will administer medications as ordered by provider, will assess and evaluate patient's response and provide education to patient for prescribed medication. RN will report any adverse and/or side effects to prescribing provider.  Therapeutic Interventions: 1 on 1 counseling sessions, Psychoeducation, Medication administration, Evaluate responses to treatment, Monitor vital signs and CBGs as ordered, Perform/monitor CIWA, COWS, AIMS and Fall Risk screenings as ordered, Perform wound care treatments  as ordered.  Evaluation of Outcomes: Not Met   LCSW Treatment Plan for Primary Diagnosis: Alcohol-induced mood disorder ([place]) Long Term Goal(s): Safe transition to appropriate next level of care at discharge, Engage patient in therapeutic group addressing interpersonal concerns.  Short Term Goals: Engage patient in aftercare planning with referrals and resources, Increase social support, Increase ability to appropriately verbalize feelings, Increase emotional regulation, Facilitate acceptance of mental health diagnosis and concerns, Facilitate patient progression through stages of change regarding substance use diagnoses and concerns, Identify triggers associated with mental health/substance abuse issues, and Increase skills for wellness and recovery  Therapeutic Interventions: Assess for all discharge needs, 1 to 1 time with Social worker, Explore available resources and support systems, Assess for adequacy in community support network, Educate family and significant other(s) on suicide prevention, Complete Psychosocial Assessment, Interpersonal group therapy.  Evaluation of Outcomes: Not Met   Progress in Treatment: Attending groups: No. Participating in groups: No. Taking medication as prescribed: Yes. Toleration medication: Yes. Family/Significant other contact made: No, will contact:  patient refuse consent to speak with social supports Patient understands diagnosis: No. Discussing patient identified problems/goals with staff: Yes. Medical problems stabilized or resolved: Yes. Denies suicidal/homicidal ideation: Yes. Issues/concerns per patient self-inventory: No.  New problem(s) identified: No, Describe:  none reported  New Short Term/Long Term Goal(s): detox, medication management  for mood stabilization; elimination of SI thoughts; development of comprehensive mental wellness/sobriety plan   Patient Goals:  Patient states that his goal is to detox.   Discharge Plan or  Barriers: Patient recently admitted. CSW will continue to follow and assess for appropriate referrals and possible discharge planning.    Reason for Continuation of Hospitalization: Medication stabilization Withdrawal symptoms  Estimated Length of Stay: 2-3 days   Scribe for Treatment Team: Zachery Conch, LCSW 07/28/2021 10:37 AM

## 2021-07-28 NOTE — Progress Notes (Addendum)
   07/28/21 2100  Psych Admission Type (Psych Patients Only)  Admission Status Voluntary  Psychosocial Assessment  Patient Complaints Anxiety;Sleep disturbance;Substance abuse  Eye Contact Fair  Facial Expression Anxious  Affect Appropriate to circumstance  Speech Logical/coherent  Interaction Assertive  Motor Activity Slow  Appearance/Hygiene In scrubs  Behavior Characteristics Cooperative;Anxious  Mood Anxious;Pleasant  Thought Process  Coherency WDL  Content WDL  Delusions None reported or observed  Perception WDL  Hallucination None reported or observed  Judgment Poor  Confusion None  Danger to Self  Current suicidal ideation? Denies  Danger to Others  Danger to Others None reported or observed   Pt seen in group tonight. Pt denies SI, HI, AVH and pain. Pt asking for Trazodone to help him sleep. Provider contacted. Pt rates anxiety 5/10 and denies depression. Endorses withdrawal symptoms: anxiety, sweats and tremors. CIWA = 6. Pt says if he takes Phenergan before he eats then he can eat without nausea. Pt said he felt better after he finally got an order for it.

## 2021-07-28 NOTE — Group Note (Signed)
Recreation Therapy Group Note   Group Topic:Stress Management  Group Date: 07/28/2021 Start Time: 0930 End Time: 0945 Facilitators: Caroll Rancher, LRT/CTRS Location: [address] Dayroom  Goal Area(s) Addresses:  Patient will identify positive stress management techniques. Patient will identify benefits of using stress management post d/c.  Group Description: LRT played a meditation that focused on setting personal boundaries.  The meditation spoke on the importance of putting your feelings and what you want even if it makes others upset.  It also expressed your self care is more important as your want to please others.   Affect/Mood: N/A   Participation Level: Did not attend    Clinical Observations/Individualized Feedback: Pt did not attend group session.     Plan: Continue to engage patient in RT group sessions 2-3x/week.   Caroll Rancher, LRT/CTRS 07/28/2021 11:38 AM

## 2021-07-28 NOTE — Group Note (Signed)
[facility] LCSW Group Therapy Note    Group Date: 07/28/2021 Start Time: 1300 End Time: 1400  Type of Therapy and Topic:  Group Therapy:  Overcoming Obstacles  Participation Level:  [facility] PARTICIPATION LEVEL: Did Not Attend  Mood:  Description of Group:   In this group patients will be encouraged to explore what they see as obstacles to their own wellness and recovery. They will be guided to discuss their thoughts, feelings, and behaviors related to these obstacles. The group will process together ways to cope with barriers, with attention given to specific choices patients can make. Each patient will be challenged to identify changes they are motivated to make in order to overcome their obstacles. This group will be process-oriented, with patients participating in exploration of their own experiences as well as giving and receiving support and challenge from other group members.  Therapeutic Goals: 1. Patient will identify personal and current obstacles as they relate to admission. 2. Patient will identify barriers that currently interfere with their wellness or overcoming obstacles.  3. Patient will identify feelings, thought process and behaviors related to these barriers. 4. Patient will identify two changes they are willing to make to overcome these obstacles:    Summary of Patient Progress  Did not attend    Therapeutic Modalities:   Cognitive Behavioral Therapy Solution Focused Therapy Motivational Interviewing Relapse Prevention Therapy   Jaaziel Peatross M Aubre Quincy, LCSWA 

## 2021-07-28 NOTE — Group Note (Signed)
Occupational Therapy Group Note  Group Topic:Feelings Management  Group Date: 07/28/2021 Start Time: 1400 End Time: 1450 Facilitators: Donne [name], OT/L    Group Description: Group encouraged increased participation and engagement through discussion focused on STRENGTHS. Patients were encouraged to fill out a worksheet to structure discussion, identifying ten strengths that they currently have. Patients were then instructed to utilize various symbols to identify which strengths help them in their relationships, school/profession, and leisure participation. Discussion also focused on identifying one strength they do not currently have but would like. Discussion within the group followed with patients sharing their responses and highlighting their own personal strengths.   Therapeutic Goals: Identify strengths vs weaknesses Discuss and identify ways we can highlight our strengths  Participation Level: Did not attend - pt reports he just ate lunch and "was probably going to be nauseous" and declined, despite invite.    Plan: Continue to engage patient in OT groups 2 - 3x/week.  07/28/2021  Donne [name], OT/L

## 2021-07-29 MED ORDER — SCOPOLAMINE 1 MG/3DAYS TD PT72
1.0000 | MEDICATED_PATCH | TRANSDERMAL | Status: DC
  Administered 2021-07-29: 1.5 mg via TRANSDERMAL
  Filled 2021-07-29 (×2): qty 1

## 2021-07-29 MED ORDER — LOPERAMIDE HCL 2 MG PO CAPS: 2.0000 mg | ORAL_CAPSULE | ORAL | Status: DC | PRN

## 2021-07-29 MED ORDER — HYDROXYZINE HCL 25 MG PO TABS
25.0000 mg | ORAL_TABLET | Freq: Four times a day (QID) | ORAL | Status: DC | PRN
  Administered 2021-07-30 (×2): 25 mg via ORAL
  Filled 2021-07-29 (×2): qty 1

## 2021-07-29 MED ORDER — PROMETHAZINE HCL 25 MG PO TABS
12.5000 mg | ORAL_TABLET | Freq: Four times a day (QID) | ORAL | Status: DC | PRN
  Administered 2021-07-29 – 2021-07-30 (×4): 12.5 mg via ORAL
  Filled 2021-07-29 (×5): qty 1

## 2021-07-29 NOTE — [facility] Group Notes (Signed)
Adult Psychoeducational Group Note  Date:  07/29/2021 Time:  8:25 PM  Group Topic/Focus:  Diagnosis Education:   The focus of this group is to discuss the major disorders that patients maybe diagnosed with.  Group discusses the importance of knowing what one's diagnosis is so that one can understand treatment and better advocate for oneself.  Participation Level:  Active  Participation Quality:  Appropriate  Affect:  Appropriate  Cognitive:  Alert and Appropriate  Insight: Good  Engagement in Group:  Engaged  Modes of Intervention:  Discussion  Additional Comments Jacalyn Lefevre 07/29/2021, 8:25 PM

## 2021-07-29 NOTE — [facility] Group Notes (Signed)
Pt attended group and participated in discussion. 

## 2021-07-29 NOTE — Progress Notes (Signed)
D:  Patient's self inventory sheet, patient has fair sleep, sleep medication helpful.  Poor appetite, normal energy level, good concentration.  Denied depression and hopeless, anxiety 7.  Withdrawals, tremors, nausea.  Denied SI.  Denied physical problems.  Denied physical pain.  Goal is getting meds when he needs them.  Plans to talk.  Does have discharge plans. A:  Medications administered per MD orders.  Emotional support and encouragement given patient. R:  Denied SI and HI, contracts for safety.  Denied A/V hallucinations.  Safety maintained with 15 minute checks.

## 2021-07-29 NOTE — Progress Notes (Addendum)
   07/29/21 2035  Psych Admission Type (Psych Patients Only)  Admission Status Voluntary  Psychosocial Assessment  Patient Complaints Anxiety;Substance abuse;Sleep disturbance  Eye Contact Fair  Facial Expression Anxious;Animated  Affect Appropriate to circumstance  Speech Logical/coherent  Interaction Assertive  Motor Activity Slow  Appearance/Hygiene In scrubs  Behavior Characteristics Cooperative;Anxious  Mood Anxious;Pleasant  Thought Process  Coherency WDL  Content WDL  Delusions None reported or observed  Perception WDL  Hallucination None reported or observed  Judgment Poor  Confusion None  Danger to Self  Current suicidal ideation? Denies  Danger to Others  Danger to Others None reported or observed   Pt denies SI, HI, AVH and pain. Pt rates anxiety 9/10 and denies depression. Says his anxiety is high d/t the nausea he is experiencing. Pt ruminating on getting Phenergan before he eats any food. Pt told that it is q6h. Pt given Phenergan at 2030. Pt able to eat food about 15 minutes later without any emesis. Pt still has tremor but denies other withdrawal symptoms.

## 2021-07-29 NOTE — Plan of Care (Signed)
Nurse discussed anxiety, depression and coping skills with patient.  

## 2021-07-29 NOTE — [facility] Group Notes (Signed)
Pt did not attend group. 

## 2021-07-29 NOTE — Progress Notes (Addendum)
[facility] MD Progress Note  07/29/2021 11:29 AM Gary [name]  MRN:  [id number] Subjective:   Gary [name] is a 35 yr old male who presents with SI and EtOH abuse.  PPHx is significant for MDD, Recurrent, Severe, severe EtOH abuse, multiple ED visits and rehab stays.  Today on 07/29/2021 Patient is seen today by me, and Dr. Loleta Chance.  Patient states his mood is good.  He still complains of nausea states Phenergan is helping him to keep the food down but its effect does not last long.  He denies any vomiting.  He denies any diarrhea.  Discussed about putting scopolamine patch to help with nausea.  He agrees with the plan.  Patient states he will be going to his friend's house after discharge who does not drink or use any drugs.  He states he used to play video games with his friend.  He states his friend will appreciate him.  He declined to go to inpatient substance abuse rehab.  Discussed about IOP program.  Patient states he wants to have more information and may be interested in IOP program.  He denies any SI, HI, AVH.  Patient slept okay last night with trazodone and woke up few times due to withdrawal effects..  He denies any side effects from medications.  Patient denies headache, vomiting, diarrhea, constipation, abdominal pain, headache, dizziness, cough, chest pain. Patient denies any concerns.  Principal Problem: Alcohol-induced mood disorder (HCC) Diagnosis: Principal Problem:   Alcohol-induced mood disorder (HCC) Active Problems:   HIV disease (HCC)   Alcohol use disorder, severe, dependence (HCC)   Thrombocytopenia (HCC)   Neutropenia (HCC)  Total Time spent with patient: 30 minutes  Past Psychiatric History: MDD, Recurrent, Severe, severe EtOH abuse, multiple ED visits and rehab stays  Past Medical History:  Past Medical History:  Diagnosis Date   Alcohol abuse    HIV (human immunodeficiency virus infection) (HCC)    Seizures (HCC)    Subdural hematoma (HCC)     Past Surgical  History:  Procedure Laterality Date   INCISION AND DRAINAGE PERIRECTAL ABSCESS N/A 09/24/2016   Procedure: IRRIGATION AND DEBRIDEMENT PERIRECTAL ABSCESS;  Surgeon: Ricarda Frame, MD;  Location: [facility] ORS;  Service: General;  Laterality: N/A;   none     Family History:  Family History  Problem Relation Age of Onset   Alcohol abuse Mother    Alcohol abuse Father    Family Psychiatric  History: Brother- Bipolar Disorder and EtOH abuse Social History:  Social History   Substance and Sexual Activity  Alcohol Use Yes   Comment: "I drink all I can"     Social History   Substance and Sexual Activity  Drug Use Yes   Types: Methamphetamines, Marijuana   Comment: last used 04/10/2019    Social History   Socioeconomic History   Marital status: Single    Spouse name: Not on file   Number of children: Not on file   Years of education: Not on file   Highest education level: Not on file  Occupational History   Occupation: unemployed  Tobacco Use   Smoking status: Every Day    Packs/day: 0.50    Types: Cigarettes   Smokeless tobacco: Never   Tobacco comments:    unable to smoke while incarcerated 6+ months 02/13/20  Vaping Use   Vaping Use: Never used  Substance and Sexual Activity   Alcohol use: Yes    Comment: "I drink all I can"   Drug use: Yes  Types: Methamphetamines, Marijuana    Comment: last used 04/10/2019   Sexual activity: Yes    Partners: Male, Male    Comment: declined condoms  03/2021  Other Topics Concern   Not on file  Social History Narrative   "Currently living on the streets"   Independent at baseline   Social Determinants of Health   Financial Resource Strain: Not on file  Food Insecurity: Not on file  Transportation Needs: Not on file  Physical Activity: Not on file  Stress: Not on file  Social Connections: Not on file    Sleep: Fair  Appetite:  Poor  Current Medications: Current Facility-Administered Medications  Medication Dose Route  Frequency Provider Last Rate Last Admin   bictegravir-emtricitabine-tenofovir AF (BIKTARVY) 50-200-25 MG per tablet 1 tablet  1 tablet Oral QPC lunch Leevy-Johnson, Brooke A, NP   1 tablet at 07/28/21 1333   feeding supplement (ENSURE ENLIVE / ENSURE PLUS) liquid 237 mL  237 mL Oral BID BM Bobbitt, Shalon E, NP   237 mL at 07/28/21 1100   folic acid (FOLVITE) tablet 1 mg  1 mg Oral Daily Leevy-Johnson, Brooke A, NP   1 mg at 07/29/21 0805   gabapentin (NEURONTIN) capsule 300 mg  300 mg Oral TID Karsten Ro, MD   300 mg at 07/29/21 0805   multivitamin with minerals tablet 1 tablet  1 tablet Oral Daily Bobbitt, Shalon E, NP   1 tablet at 07/28/21 2117   pantoprazole (PROTONIX) EC tablet 20 mg  20 mg Oral BID Leevy-Johnson, Brooke A, NP   20 mg at 07/29/21 0806   potassium chloride SA (KLOR-CON) CR tablet 40 mEq  40 mEq Oral Once Lauro Franklin, MD       promethazine (PHENERGAN) tablet 12.5 mg  12.5 mg Oral Q8H PRN Laveda Abbe, NP   12.5 mg at 07/29/21 0867   scopolamine (TRANSDERM-SCOP) 1 MG/3DAYS 1.5 mg  1 patch Transdermal Q72H Karsten Ro, MD       thiamine tablet 100 mg  100 mg Oral Daily Bobbitt, Shalon E, NP   100 mg at 07/29/21 0806   traZODone (DESYREL) tablet 50 mg  50 mg Oral QHS,MR X 1 Nira Conn A, NP   50 mg at 07/28/21 2242    Lab Results:  Results for orders placed or performed during the hospital encounter of 07/26/21 (from the past 48 hour(s))  Basic metabolic panel     Status: Abnormal   Collection Time: 07/28/21  6:28 PM  Result Value Ref Range   Sodium 134 (L) 135 - 145 mmol/L   Potassium 4.1 3.5 - 5.1 mmol/L   Chloride 99 98 - 111 mmol/L   CO2 26 22 - 32 mmol/L   Glucose, Bld 96 70 - 99 mg/dL    Comment: Glucose reference range applies only to samples taken after fasting for at least 8 hours.   BUN 11 6 - 20 mg/dL   Creatinine, Ser 6.19 0.61 - 1.24 mg/dL   Calcium 9.2 8.9 - 50.9 mg/dL   GFR, Estimated >32 >67 mL/min    Comment: (NOTE) Calculated  using the CKD-EPI Creatinine Equation (2021)    Anion gap 9 5 - 15    Comment: Performed at [facility], [address], [place], [place] [address]  CBC with Differential/Platelet     Status: Abnormal   Collection Time: 07/28/21  6:28 PM  Result Value Ref Range   WBC 3.6 (L) 4.0 - 10.5 K/uL  RBC 3.87 (L) 4.22 - 5.81 MIL/uL   Hemoglobin 13.3 13.0 - 17.0 g/dL   HCT 03.4 74.2 - 59.5 %   MCV 103.4 (H) 80.0 - 100.0 fL   MCH 34.4 (H) 26.0 - 34.0 pg   MCHC 33.3 30.0 - 36.0 g/dL   RDW 63.8 (H) 75.6 - 43.3 %   Platelets 140 (L) 150 - 400 K/uL   nRBC 0.0 0.0 - 0.2 %   Neutrophils Relative % 59 %   Neutro Abs 2.1 1.7 - 7.7 K/uL   Lymphocytes Relative 28 %   Lymphs Abs 1.0 0.7 - 4.0 K/uL   Monocytes Relative 9 %   Monocytes Absolute 0.3 0.1 - 1.0 K/uL   Eosinophils Relative 3 %   Eosinophils Absolute 0.1 0.0 - 0.5 K/uL   Basophils Relative 1 %   Basophils Absolute 0.0 0.0 - 0.1 K/uL   Immature Granulocytes 0 %   Abs Immature Granulocytes 0.01 0.00 - 0.07 K/uL    Comment: Performed at [facility], [address], [place], [place] [address]  Magnesium     Status: None   Collection Time: 07/28/21  6:28 PM  Result Value Ref Range   Magnesium 2.1 1.7 - 2.4 mg/dL    Comment: Performed at [facility], [address], [place], [place] [address]    Blood Alcohol level:  Lab Results  Component Value Date   ETH 231 (H) 07/24/2021   ETH 308 (HH) 07/20/2021    Metabolic Disorder Labs: Lab Results  Component Value Date   HGBA1C 4.3 (L) 07/26/2021   MPG 76.71 07/26/2021   MPG 99.67 03/12/2021   No results found for: PROLACTIN Lab Results  Component Value Date   CHOL 198 07/26/2021   TRIG 209 (H) 07/26/2021   HDL 52 07/26/2021   CHOLHDL 3.8 07/26/2021   VLDL 42 (H) 07/26/2021   LDLCALC 104 (H) 07/26/2021   LDLCALC 79 09/19/2019    Physical Findings: CIWA:  CIWA-Ar Total: 4   Musculoskeletal: Strength & Muscle Tone:  Fair Gait & Station: slow but steady- ambulates without assistance Patient leans: N/A  Psychiatric Specialty Exam:  Presentation  General Appearance: Appropriate for Environment  Eye Contact:Fair  Speech:Clear and Coherent  Speech Volume:Normal  Handedness:Right   Mood and Affect  Mood:Euthymic , irritable  Affect:Congruent   Thought Process  Thought Processes:Coherent; Goal Directed  Descriptions of Associations:Intact  Orientation:Full (Time, Place and Person)  Thought Content:WDL - no evidence of acute psychosis, delusions or paranoia  History of Schizophrenia/Schizoaffective disorder:No  Duration of Psychotic Symptoms:Less than six months  Hallucinations:Hallucinations: None  Ideas of Reference:None  Suicidal Thoughts:Suicidal Thoughts: No  Homicidal Thoughts:Homicidal Thoughts: No   Sensorium  Memory:Immediate Fair; Recent Fair  Judgment:Poor  Insight:Poor   Executive Functions  Concentration:Good  Attention Span:Good  Recall:Good  Fund of Knowledge:Fair  Language:Good   Psychomotor Activity  Psychomotor Activity:Normal   Assets  Assets:Communication Skills; Social Support; Resilience   Sleep  Sleep:Sleep: Fair Number of Hours of Sleep: 3.75    Physical Exam: Physical Exam Vitals and nursing note reviewed.  Constitutional:      General: He is not in acute distress.    Appearance: He is not ill-appearing or toxic-appearing.  HENT:     Head: Normocephalic and atraumatic.  Pulmonary:     Effort: Pulmonary effort is normal.  Musculoskeletal:        General: Normal range of motion.  Neurological:     General: No focal deficit present.  Mental Status: He is alert.   Review of Systems  Respiratory:  Negative for cough and shortness of breath.   Cardiovascular:  Negative for chest pain (mild, from vomiting).  Gastrointestinal:  Positive for nausea. Negative for abdominal pain, constipation, diarrhea and vomiting.   Neurological:  Negative for weakness and headaches.  Psychiatric/Behavioral:  Positive for substance abuse. Negative for depression, hallucinations and suicidal ideas. The patient is nervous/anxious.   Blood pressure 109/72, pulse 87, temperature 98.1 F (36.7 C), temperature source Oral, resp. rate 18, height 6\' 1"  (1.854 m), weight 72.6 kg, SpO2 100 %. Body mass index is 21.11 kg/m.   Treatment Plan Summary: Daily contact with patient to assess and evaluate symptoms and progress in treatment  Mr. Rappaport is a 35 yr old male who presents with SI and EtOH abuse.  PPHx is significant for MDD, Recurrent, Severe, severe EtOH abuse, multiple ED visits and rehab stays.   EKG shows NSR with a QTC of 442.  His potassium was low over the weekend at 3.3.  Repeat potassium on 9/17 showed potassium 3.5.  He continues to decline medications for depression despite discussing the benefits.  He declines residential rehab at this time but may be interested in as a IOP program.  Social worker will give him more information about IOP program.   Neurontin was increased to to 300 mg 3 times daily.  Will put scopolamine patch to help with nausea.    Alcohol induced mood d/o (r/o MDD by hx) -Refusing antidepressant trial at this time - Would benefit from [facility] or residential rehab but currently refusing - TSH 2.079   EtOH use d/o - severe dependence: -Completed CIWA, last score 4 -continue gabapentin to 300 mg TID for potential cravings/withdrawal -Continue scheduled Ativan taper to be finished on 9/20 -completed Ativan 1 mg q6 PRN CIWA>10 -discontinued Zofran-not working Increase phenergan 12.5mg  PO Q6H PRN + Scopalamine patch 1.5mg  transdermal /72 hours -Continue Thiamine 100 mg daily -Continue Folic Acid 1 mg daily -Continue Multivitamin daily -Continue Protonix 20 mg BID   Hyponatremia -Sodium 134, potassium 4.1, magnesium 2.1, patient will follow up with primary care after discharge. - EKG NSR  78bpm with QTC 08-10-1974 -Encouraged to drink Ensure.  HIV  Leukopenia -Absol Neut up to  2.1 on recheck with WBC improved from 3.1-3.6 -CD4 and viral load drawn-will need new sample. -Continue Biktarvy 1 tablet daily after lunch -Patient will need f/u with ID after discharge.  -Continue PRN's: Tylenol, Maalox, Atarax, Milk of Magnesia, Trazodone   , MD PGY2  07/29/2021, 11:29 AM  Edit  07/31/2021 MD  07/29/21 @1731

## 2021-07-30 DIAGNOSIS — F1020 Alcohol dependence, uncomplicated: Secondary | ICD-10-CM

## 2021-07-30 DIAGNOSIS — F10982 Alcohol use, unspecified with alcohol-induced sleep disorder: Secondary | ICD-10-CM

## 2021-07-30 DIAGNOSIS — K5900 Constipation, unspecified: Secondary | ICD-10-CM | POA: Clinically undetermined

## 2021-07-30 MED ORDER — MAGNESIUM HYDROXIDE 400 MG/5ML PO SUSP: 30.0000 mL | Freq: Every day | ORAL | Status: DC | PRN

## 2021-07-30 MED ORDER — MAGNESIUM HYDROXIDE 400 MG/5ML PO SUSP
ORAL | Status: AC
  Administered 2021-07-30: 30 mL via ORAL
  Filled 2021-07-30: qty 30

## 2021-07-30 MED ORDER — DOXEPIN HCL 10 MG PO CAPS
10.0000 mg | ORAL_CAPSULE | Freq: Every day | ORAL | Status: DC
  Administered 2021-07-30: 10 mg via ORAL
  Filled 2021-07-30: qty 1
  Filled 2021-07-30 (×2): qty 7
  Filled 2021-07-30: qty 1

## 2021-07-30 NOTE — Group Note (Deleted)
LCSW Group Therapy Note   Group Date: 07/30/2021 Start Time: 1300 End Time: 1345   Type of Therapy and Topic:  Group Therapy:   Participation Level:  {[facility] PARTICIPATION LEVEL:22264}  Description of Group:   Therapeutic Goals:  1.     Summary of Patient Progress:    ***  Therapeutic Modalities:   Chassity Ludke M Cree [place], LCSWA 07/30/2021  1:48 PM    

## 2021-07-30 NOTE — Progress Notes (Signed)
[facility] MD Progress Note  07/30/2021 11:07 AM Gary [name]  MRN:  [id number] Subjective:   Gary [name] is a 35 yr old male who presents with SI and EtOH abuse.  PPHx is significant for MDD, Recurrent, Severe, severe EtOH abuse, multiple ED visits and rehab stays.  Today on 07/30/2021 Patient is seen today by me, and Dr. Loleta Chance.  Patient states he is doing "fine".  He still complains of nausea states Phenergan is helping him to keep the food down but scopolamine patch is not making much difference.  He is able to hold down his food.  He denies any vomiting.  He denies any diarrhea.  Discussed about starting doxepin which helps with sleep and nausea.  Patient agrees with the plan.  Discussed about his interest in IOP program but he refuses that. He denies any SI, HI, AVH.  He states that he is feeling very anxious today and had a panic attack if he hears little noise.  Patient he slept well last night with trazodone but he had to take 2 doses of trazodone. He denies any side effects from medications.  He states that GABA has not been helping him with craving.  He complains of burning chest pain sometimes.  Patient denies headache, vomiting, diarrhea, constipation, abdominal pain, headache, dizziness, cough, chest pain. Patient denies any concerns.  Principal Problem: Alcohol-induced mood disorder (HCC) Diagnosis: Principal Problem:   Alcohol-induced mood disorder (HCC) Active Problems:   HIV disease (HCC)   Alcohol use disorder, severe, dependence (HCC)   Thrombocytopenia (HCC)   Neutropenia (HCC)  Total Time spent with patient: 30 minutes  Past Psychiatric History: MDD, Recurrent, Severe, severe EtOH abuse, multiple ED visits and rehab stays  Past Medical History:  Past Medical History:  Diagnosis Date   Alcohol abuse    HIV (human immunodeficiency virus infection) (HCC)    Seizures (HCC)    Subdural hematoma (HCC)     Past Surgical History:  Procedure Laterality Date   INCISION AND  DRAINAGE PERIRECTAL ABSCESS N/A 09/24/2016   Procedure: IRRIGATION AND DEBRIDEMENT PERIRECTAL ABSCESS;  Surgeon: Ricarda Frame, MD;  Location: [facility] ORS;  Service: General;  Laterality: N/A;   none     Family History:  Family History  Problem Relation Age of Onset   Alcohol abuse Mother    Alcohol abuse Father    Family Psychiatric  History: Brother- Bipolar Disorder and EtOH abuse Social History:  Social History   Substance and Sexual Activity  Alcohol Use Yes   Comment: "I drink all I can"     Social History   Substance and Sexual Activity  Drug Use Yes   Types: Methamphetamines, Marijuana   Comment: last used 04/10/2019    Social History   Socioeconomic History   Marital status: Single    Spouse name: Not on file   Number of children: Not on file   Years of education: Not on file   Highest education level: Not on file  Occupational History   Occupation: unemployed  Tobacco Use   Smoking status: Every Day    Packs/day: 0.50    Types: Cigarettes   Smokeless tobacco: Never   Tobacco comments:    unable to smoke while incarcerated 6+ months 02/13/20  Vaping Use   Vaping Use: Never used  Substance and Sexual Activity   Alcohol use: Yes    Comment: "I drink all I can"   Drug use: Yes    Types: Methamphetamines, Marijuana    Comment:  last used 04/10/2019   Sexual activity: Yes    Partners: Male, Male    Comment: declined condoms  03/2021  Other Topics Concern   Not on file  Social History Narrative   "Currently living on the streets"   Independent at baseline   Social Determinants of Health   Financial Resource Strain: Not on file  Food Insecurity: Not on file  Transportation Needs: Not on file  Physical Activity: Not on file  Stress: Not on file  Social Connections: Not on file    Sleep: Fair  Appetite:  Poor  Current Medications: Current Facility-Administered Medications  Medication Dose Route Frequency Provider Last Rate Last Admin    bictegravir-emtricitabine-tenofovir AF (BIKTARVY) 50-200-25 MG per tablet 1 tablet  1 tablet Oral QPC lunch Leevy-Johnson, Brooke A, NP   1 tablet at 07/29/21 1209   feeding supplement (ENSURE ENLIVE / ENSURE PLUS) liquid 237 mL  237 mL Oral BID BM Bobbitt, Shalon E, NP   237 mL at 07/30/21 1000   folic acid (FOLVITE) tablet 1 mg  1 mg Oral Daily Leevy-Johnson, Brooke A, NP   1 mg at 07/30/21 0800   gabapentin (NEURONTIN) capsule 300 mg  300 mg Oral TID Karsten Ro, MD   300 mg at 07/30/21 4562   hydrOXYzine (ATARAX/VISTARIL) tablet 25 mg  25 mg Oral Q6H PRN Roselle Locus, MD   25 mg at 07/30/21 5638   loperamide (IMODIUM) capsule 2-4 mg  2-4 mg Oral PRN Roselle Locus, MD       multivitamin with minerals tablet 1 tablet  1 tablet Oral Daily Bobbitt, Shalon E, NP   1 tablet at 07/29/21 2124   pantoprazole (PROTONIX) EC tablet 20 mg  20 mg Oral BID Leevy-Johnson, Brooke A, NP   20 mg at 07/30/21 0828   promethazine (PHENERGAN) tablet 12.5 mg  12.5 mg Oral Q6H PRN Roselle Locus, MD   12.5 mg at 07/30/21 0631   scopolamine (TRANSDERM-SCOP) 1 MG/3DAYS 1.5 mg  1 patch Transdermal Q72H Karsten Ro, MD   1.5 mg at 07/29/21 1230   thiamine tablet 100 mg  100 mg Oral Daily Bobbitt, Shalon E, NP   100 mg at 07/30/21 0800   traZODone (DESYREL) tablet 50 mg  50 mg Oral QHS,MR X 1 Nira Conn A, NP   50 mg at 07/30/21 0005    Lab Results:  Results for orders placed or performed during the hospital encounter of 07/26/21 (from the past 48 hour(s))  Basic metabolic panel     Status: Abnormal   Collection Time: 07/28/21  6:28 PM  Result Value Ref Range   Sodium 134 (L) 135 - 145 mmol/L   Potassium 4.1 3.5 - 5.1 mmol/L   Chloride 99 98 - 111 mmol/L   CO2 26 22 - 32 mmol/L   Glucose, Bld 96 70 - 99 mg/dL    Comment: Glucose reference range applies only to samples taken after fasting for at least 8 hours.   BUN 11 6 - 20 mg/dL   Creatinine, Ser 9.37 0.61 - 1.24 mg/dL   Calcium 9.2  8.9 - 34.2 mg/dL   GFR, Estimated >87 >68 mL/min    Comment: (NOTE) Calculated using the CKD-EPI Creatinine Equation (2021)    Anion gap 9 5 - 15    Comment: Performed at [facility], [address] [place], [place] [address]  CBC with Differential/Platelet     Status: Abnormal   Collection Time: 07/28/21  6:28  PM  Result Value Ref Range   WBC 3.6 (L) 4.0 - 10.5 K/uL   RBC 3.87 (L) 4.22 - 5.81 MIL/uL   Hemoglobin 13.3 13.0 - 17.0 g/dL   HCT 83.4 19.6 - 22.2 %   MCV 103.4 (H) 80.0 - 100.0 fL   MCH 34.4 (H) 26.0 - 34.0 pg   MCHC 33.3 30.0 - 36.0 g/dL   RDW 97.9 (H) 89.2 - 11.9 %   Platelets 140 (L) 150 - 400 K/uL   nRBC 0.0 0.0 - 0.2 %   Neutrophils Relative % 59 %   Neutro Abs 2.1 1.7 - 7.7 K/uL   Lymphocytes Relative 28 %   Lymphs Abs 1.0 0.7 - 4.0 K/uL   Monocytes Relative 9 %   Monocytes Absolute 0.3 0.1 - 1.0 K/uL   Eosinophils Relative 3 %   Eosinophils Absolute 0.1 0.0 - 0.5 K/uL   Basophils Relative 1 %   Basophils Absolute 0.0 0.0 - 0.1 K/uL   Immature Granulocytes 0 %   Abs Immature Granulocytes 0.01 0.00 - 0.07 K/uL    Comment: Performed at [facility], [address], [place], [place] [address]  Magnesium     Status: None   Collection Time: 07/28/21  6:28 PM  Result Value Ref Range   Magnesium 2.1 1.7 - 2.4 mg/dL    Comment: Performed at [facility], [address], [place], [place] [address]    Blood Alcohol level:  Lab Results  Component Value Date   ETH 231 (H) 07/24/2021   ETH 308 (HH) 07/20/2021    Metabolic Disorder Labs: Lab Results  Component Value Date   HGBA1C 4.3 (L) 07/26/2021   MPG 76.71 07/26/2021   MPG 99.67 03/12/2021   No results found for: PROLACTIN Lab Results  Component Value Date   CHOL 198 07/26/2021   TRIG 209 (H) 07/26/2021   HDL 52 07/26/2021   CHOLHDL 3.8 07/26/2021   VLDL 42 (H) 07/26/2021   LDLCALC 104 (H) 07/26/2021   LDLCALC 79 09/19/2019    Physical  Findings: CIWA:  CIWA-Ar Total: 5   Musculoskeletal: Strength & Muscle Tone: Fair Gait & Station: slow but steady- ambulates without assistance Patient leans: N/A  Psychiatric Specialty Exam:  Presentation  General Appearance: Appropriate for Environment  Eye Contact:Good  Speech:Clear and Coherent  Speech Volume:Normal  Handedness:Right   Mood and Affect  Mood:Euthymic , irritable  Affect:Congruent   Thought Process  Thought Processes:Coherent; Goal Directed  Descriptions of Associations:Intact  Orientation:Full (Time, Place and Person)  Thought Content:WDL - no evidence of acute psychosis, delusions or paranoia  History of Schizophrenia/Schizoaffective disorder:No  Duration of Psychotic Symptoms:Less than six months  Hallucinations:Hallucinations: None  Ideas of Reference:None  Suicidal Thoughts:Suicidal Thoughts: No SI Passive Intent and/or Plan: -- (None today)  Homicidal Thoughts:Homicidal Thoughts: No   Sensorium  Memory:Immediate Fair; Recent Fair  Judgment:Poor  Insight:Poor   Executive Functions  Concentration:Good  Attention Span:Good  Recall:Good  Fund of Knowledge:Fair  Language:Good   Psychomotor Activity  Psychomotor Activity:Normal   Assets  Assets:Communication Skills; Social Support; Resilience   Sleep  Sleep:Sleep: Fair Number of Hours of Sleep: 6.75    Physical Exam: Physical Exam Vitals and nursing note reviewed.  Constitutional:      General: He is not in acute distress.    Appearance: He is not ill-appearing or toxic-appearing.  HENT:     Head: Normocephalic and atraumatic.  Pulmonary:     Effort: Pulmonary effort is normal.  Musculoskeletal:        General: Normal range of motion.  Neurological:     General: No focal deficit present.     Mental Status: He is alert.   Review of Systems  Respiratory:  Negative for cough and shortness of breath.   Cardiovascular:  Negative for chest pain  (mild, from vomiting).  Gastrointestinal:  Positive for heartburn and nausea. Negative for abdominal pain, constipation, diarrhea and vomiting.  Neurological:  Negative for weakness and headaches.  Psychiatric/Behavioral:  Positive for substance abuse. Negative for depression, hallucinations and suicidal ideas. The patient is nervous/anxious.   Blood pressure 93/66, pulse 85, temperature 98 F (36.7 C), temperature source Oral, resp. rate 18, height 6\' 1"  (1.854 m), weight 72.6 kg, SpO2 100 %. Body mass index is 21.11 kg/m.   Treatment Plan Summary: Daily contact with patient to assess and evaluate symptoms and progress in treatment  Gary [name] is a 35 yr old male who presents with SI and EtOH abuse.  PPHx is significant for MDD, Recurrent, Severe, severe EtOH abuse, multiple ED visits and rehab stays.   EKG shows NSR with a QTC of 442.  His potassium was low over the weekend at 3.3.  Repeat potassium on 9/17 showed potassium 3.5.  He continues to decline medications for depression despite discussing the benefits.  He declines residential rehab and IOP program.  Will add doxepin which can help with sleep and nausea.    Alcohol induced mood d/o (r/o MDD by hx) -Refusing antidepressant trial at this time - Would benefit from [facility] or residential rehab but currently refusing - TSH 2.079   EtOH use d/o - severe dependence: -Completed CIWA with Ativan, last score 5 -continue gabapentin to 300 mg TID for potential cravings/withdrawal -Continue phenergan 12.5mg  PO Q6H PRN + Scopalamine patch 1.5mg  transdermal /72 hours -Start doxepin 10 mg tablet nightly to help with nausea and insomnia. -Continue Thiamine 100 mg daily -Continue Folic Acid 1 mg daily -Continue Multivitamin daily -Continue Protonix 20 mg BID   Hyponatremia -Sodium 134, potassium 4.1, magnesium 2.1, patient will follow up with primary care after discharge. - EKG NSR 78bpm with QTC 08-10-1974 -Encouraged to drink Ensure.  HIV   Leukopenia -Absol Neut up to  2.1 on recheck with WBC improved from 3.1-3.6 -CD4 and viral load drawn-will need new sample.-Ordered again -Continue Biktarvy 1 tablet daily after lunch -Patient will need f/u with ID after discharge.  -Continue PRN's: Tylenol, Maalox, Atarax, Milk of Magnesia, Trazodone   , MD PGY2  07/30/2021, 11:07 AM  Edit  08/01/2021 MD  07/29/21 @1731

## 2021-07-30 NOTE — Progress Notes (Addendum)
   07/30/21 2100  Psych Admission Type (Psych Patients Only)  Admission Status Voluntary  Psychosocial Assessment  Patient Complaints Substance abuse;Anxiety  Eye Contact Fair  Facial Expression Anxious  Affect Appropriate to circumstance  Speech Logical/coherent  Interaction Assertive  Motor Activity Slow  Appearance/Hygiene In scrubs  Behavior Characteristics Cooperative;Anxious  Mood Anxious;Pleasant  Thought Process  Coherency WDL  Content WDL  Delusions None reported or observed  Perception WDL  Hallucination None reported or observed  Judgment Impaired  Confusion None  Danger to Self  Current suicidal ideation? Denies  Danger to Others  Danger to Others None reported or observed   Pt seen in the milieu. Pt denies SI, HI, AVH and pain. Pt endorses noises and sounds have been bothering him today. "The lady this morning knocked on the door and it made me jump out of my skin. Noises have been bothering me today." Pt has not required his Phenergan this evening. Pt is experiencing constipation with c/o bloating in abdomen. MOM given but has not produced a BM yet. Rates anxiety 7/10 and denies depression. Started on Doxepin tonight for insomnia.

## 2021-07-30 NOTE — Progress Notes (Addendum)
[facility] MD Progress Note  07/30/2021 4:37 PM Gary [name]  MRN:  [id number] Subjective:   Mr. Kittleson is a 35 yr old male who presents with SI and EtOH abuse.  PPHx is significant for MDD, Recurrent, Severe, severe EtOH abuse, multiple ED visits and rehab stays.  Today on 07/30/2021 Patient is seen this morning and at several other points during the day.   The patient reports that sleep was overall decreased. Appetite has been so-so. He still has nausea, and requires frequent phenergan. He has anxiety and hypervigilance today. He had trazodone 2 times last night due to the issues with latency. After comparing options, willing to try doxepin. He does not want IOP, wants to "hurry up and get a job". He has burning intermittent chest pain at times. No SOB or vomiting. Later in the day complains of constipation x 2 days, last stool yesterday was scant and firm. No thoughts of harm to self or others, no hallucinations.     Principal Problem: Alcohol-induced mood disorder (HCC) Diagnosis: Principal Problem:   Alcohol-induced mood disorder (HCC) Active Problems:   HIV disease (HCC)   Alcohol use disorder, severe, dependence (HCC)   Thrombocytopenia (HCC)   Neutropenia (HCC)   Constipation  Total Time spent with patient: 30 minutes  Past Psychiatric History: MDD, Recurrent, Severe, severe EtOH abuse, multiple ED visits and rehab stays  Past Medical History:  Past Medical History:  Diagnosis Date   Alcohol abuse    HIV (human immunodeficiency virus infection) (HCC)    Seizures (HCC)    Subdural hematoma (HCC)     Past Surgical History:  Procedure Laterality Date   INCISION AND DRAINAGE PERIRECTAL ABSCESS N/A 09/24/2016   Procedure: IRRIGATION AND DEBRIDEMENT PERIRECTAL ABSCESS;  Surgeon: Ricarda Frame, MD;  Location: [facility] ORS;  Service: General;  Laterality: N/A;   none     Family History:  Family History  Problem Relation Age of Onset   Alcohol abuse Mother    Alcohol abuse Father     Family Psychiatric  History: Brother- Bipolar Disorder and EtOH abuse Social History:  Social History   Substance and Sexual Activity  Alcohol Use Yes   Comment: "I drink all I can"     Social History   Substance and Sexual Activity  Drug Use Yes   Types: Methamphetamines, Marijuana   Comment: last used 04/10/2019    Social History   Socioeconomic History   Marital status: Single    Spouse name: Not on file   Number of children: Not on file   Years of education: Not on file   Highest education level: Not on file  Occupational History   Occupation: unemployed  Tobacco Use   Smoking status: Every Day    Packs/day: 0.50    Types: Cigarettes   Smokeless tobacco: Never   Tobacco comments:    unable to smoke while incarcerated 6+ months 02/13/20  Vaping Use   Vaping Use: Never used  Substance and Sexual Activity   Alcohol use: Yes    Comment: "I drink all I can"   Drug use: Yes    Types: Methamphetamines, Marijuana    Comment: last used 04/10/2019   Sexual activity: Yes    Partners: Male, Male    Comment: declined condoms  03/2021  Other Topics Concern   Not on file  Social History Narrative   "Currently living on the streets"   Independent at baseline   Social Determinants of Health   Financial Resource Strain:  Not on file  Food Insecurity: Not on file  Transportation Needs: Not on file  Physical Activity: Not on file  Stress: Not on file  Social Connections: Not on file    Sleep: Fair  Appetite:  Poor  Current Medications: Current Facility-Administered Medications  Medication Dose Route Frequency Provider Last Rate Last Admin   bictegravir-emtricitabine-tenofovir AF (BIKTARVY) 50-200-25 MG per tablet 1 tablet  1 tablet Oral QPC lunch Leevy-Johnson, Brooke A, NP   1 tablet at 07/30/21 1210   doxepin (SINEQUAN) capsule 10 mg  10 mg Oral QHS Doda, Traci Sermon, MD       feeding supplement (ENSURE ENLIVE / ENSURE PLUS) liquid 237 mL  237 mL Oral BID BM Bobbitt,  Shalon E, NP   237 mL at 07/30/21 1000   folic acid (FOLVITE) tablet 1 mg  1 mg Oral Daily Leevy-Johnson, Brooke A, NP   1 mg at 07/30/21 0800   gabapentin (NEURONTIN) capsule 300 mg  300 mg Oral TID Karsten Ro, MD   300 mg at 07/30/21 1210   hydrOXYzine (ATARAX/VISTARIL) tablet 25 mg  25 mg Oral Q6H PRN Roselle Locus, MD   25 mg at 07/30/21 4481   loperamide (IMODIUM) capsule 2-4 mg  2-4 mg Oral PRN Roselle Locus, MD       magnesium hydroxide (MILK OF MAGNESIA) suspension 30 mL  30 mL Oral Daily PRN Roselle Locus, MD   30 mL at 07/30/21 1535   multivitamin with minerals tablet 1 tablet  1 tablet Oral Daily Bobbitt, Shalon E, NP   1 tablet at 07/29/21 2124   pantoprazole (PROTONIX) EC tablet 20 mg  20 mg Oral BID Leevy-Johnson, Brooke A, NP   20 mg at 07/30/21 0828   promethazine (PHENERGAN) tablet 12.5 mg  12.5 mg Oral Q6H PRN Roselle Locus, MD   12.5 mg at 07/30/21 1209   scopolamine (TRANSDERM-SCOP) 1 MG/3DAYS 1.5 mg  1 patch Transdermal Q72H Karsten Ro, MD   1.5 mg at 07/29/21 1230   thiamine tablet 100 mg  100 mg Oral Daily Bobbitt, Shalon E, NP   100 mg at 07/30/21 0800   traZODone (DESYREL) tablet 50 mg  50 mg Oral QHS,MR X 1 Nira Conn A, NP   50 mg at 07/30/21 0005    Lab Results:  Results for orders placed or performed during the hospital encounter of 07/26/21 (from the past 48 hour(s))  Basic metabolic panel     Status: Abnormal   Collection Time: 07/28/21  6:28 PM  Result Value Ref Range   Sodium 134 (L) 135 - 145 mmol/L   Potassium 4.1 3.5 - 5.1 mmol/L   Chloride 99 98 - 111 mmol/L   CO2 26 22 - 32 mmol/L   Glucose, Bld 96 70 - 99 mg/dL    Comment: Glucose reference range applies only to samples taken after fasting for at least 8 hours.   BUN 11 6 - 20 mg/dL   Creatinine, Ser 8.56 0.61 - 1.24 mg/dL   Calcium 9.2 8.9 - 31.4 mg/dL   GFR, Estimated >97 >02 mL/min    Comment: (NOTE) Calculated using the CKD-EPI Creatinine Equation  (2021)    Anion gap 9 5 - 15    Comment: Performed at [facility], [address] [place], [place] [address]  CBC with Differential/Platelet     Status: Abnormal   Collection Time: 07/28/21  6:28 PM  Result Value Ref Range   WBC 3.6 (L) 4.0 -  10.5 K/uL   RBC 3.87 (L) 4.22 - 5.81 MIL/uL   Hemoglobin 13.3 13.0 - 17.0 g/dL   HCT 30.1 60.1 - 09.3 %   MCV 103.4 (H) 80.0 - 100.0 fL   MCH 34.4 (H) 26.0 - 34.0 pg   MCHC 33.3 30.0 - 36.0 g/dL   RDW 23.5 (H) 57.3 - 22.0 %   Platelets 140 (L) 150 - 400 K/uL   nRBC 0.0 0.0 - 0.2 %   Neutrophils Relative % 59 %   Neutro Abs 2.1 1.7 - 7.7 K/uL   Lymphocytes Relative 28 %   Lymphs Abs 1.0 0.7 - 4.0 K/uL   Monocytes Relative 9 %   Monocytes Absolute 0.3 0.1 - 1.0 K/uL   Eosinophils Relative 3 %   Eosinophils Absolute 0.1 0.0 - 0.5 K/uL   Basophils Relative 1 %   Basophils Absolute 0.0 0.0 - 0.1 K/uL   Immature Granulocytes 0 %   Abs Immature Granulocytes 0.01 0.00 - 0.07 K/uL    Comment: Performed at [facility], [address], [place], [place] [address]  Magnesium     Status: None   Collection Time: 07/28/21  6:28 PM  Result Value Ref Range   Magnesium 2.1 1.7 - 2.4 mg/dL    Comment: Performed at [facility], [address] [place], [place] [address]    Blood Alcohol level:  Lab Results  Component Value Date   ETH 231 (H) 07/24/2021   ETH 308 (HH) 07/20/2021    Metabolic Disorder Labs: Lab Results  Component Value Date   HGBA1C 4.3 (L) 07/26/2021   MPG 76.71 07/26/2021   MPG 99.67 03/12/2021   No results found for: PROLACTIN Lab Results  Component Value Date   CHOL 198 07/26/2021   TRIG 209 (H) 07/26/2021   HDL 52 07/26/2021   CHOLHDL 3.8 07/26/2021   VLDL 42 (H) 07/26/2021   LDLCALC 104 (H) 07/26/2021   LDLCALC 79 09/19/2019    Physical Findings: CIWA:  CIWA-Ar Total: 5   Musculoskeletal: Strength & Muscle Tone: Fair Gait & Station: slow but steady-  ambulates without assistance Patient leans: N/A  Psychiatric Specialty Exam:  Presentation  General Appearance: Appropriate for Environment  Eye Contact:Good  Speech:Clear and Coherent  Speech Volume:Normal  Handedness:Right   Mood and Affect  Mood:Euthymic , irritable  Affect:Congruent   Thought Process  Thought Processes:Coherent; Goal Directed  Descriptions of Associations:Intact  Orientation:Full (Time, Place and Person)  Thought Content:WDL - no evidence of acute psychosis, delusions or paranoia  History of Schizophrenia/Schizoaffective disorder:No  Duration of Psychotic Symptoms:Less than six months  Hallucinations:Hallucinations: None  Ideas of Reference:None  Suicidal Thoughts:Suicidal Thoughts: No SI Passive Intent and/or Plan: -- (None today)  Homicidal Thoughts:Homicidal Thoughts: No   Sensorium  Memory:Immediate Fair; Recent Fair  Judgment:Poor  Insight:Poor   Executive Functions  Concentration:Good  Attention Span:Good  Recall:Good  Fund of Knowledge:Fair  Language:Good   Psychomotor Activity  Psychomotor Activity:Normal   Assets  Assets:Communication Skills; Social Support; Resilience   Sleep  Sleep:Sleep: Fair Number of Hours of Sleep: 6.75    Physical Exam: Physical Exam Vitals and nursing note reviewed.  Constitutional:      General: He is not in acute distress.    Appearance: He is not ill-appearing or toxic-appearing.  HENT:     Head: Normocephalic and atraumatic.  Eyes:     Extraocular Movements: Extraocular movements intact.  Pulmonary:     Effort: Pulmonary effort is normal.  Musculoskeletal:  General: Normal range of motion.     Cervical back: Normal range of motion.  Neurological:     General: No focal deficit present.     Mental Status: He is alert.  Psychiatric:        Attention and Perception: Attention normal.        Mood and Affect: Mood is anxious and depressed.        Speech:  Speech normal.        Behavior: Behavior normal.        Cognition and Memory: Cognition normal.   Review of Systems  Respiratory:  Negative for cough and shortness of breath.   Cardiovascular:  Negative for chest pain (mild, from vomiting).  Gastrointestinal:  Positive for heartburn and nausea. Negative for abdominal pain, constipation, diarrhea and vomiting.  Neurological:  Negative for weakness and headaches.  Psychiatric/Behavioral:  Positive for substance abuse. Negative for depression, hallucinations and suicidal ideas. The patient is nervous/anxious.   Blood pressure 106/65, pulse 64, temperature 98 F (36.7 C), temperature source Oral, resp. rate 18, height 6\' 1"  (1.854 m), weight 72.6 kg, SpO2 100 %. Body mass index is 21.11 kg/m.   Treatment Plan Summary: Daily contact with patient to assess and evaluate symptoms and progress in treatment  Mr. Aliano is a 35 yr old male who presents with SI and EtOH abuse.  PPHx is significant for MDD, Recurrent, Severe, severe EtOH abuse, multiple ED visits and rehab stays.   EKG shows NSR with a QTC of 442.  His potassium was low over the weekend at 3.3.  Repeat potassium on 9/17 showed potassium 3.5.  He continues to decline medications for depression despite discussing the benefits.  He declines residential rehab and IOP program.  Will add doxepin which can help with sleep and nausea.    Alcohol induced mood d/o (r/o MDD by hx) -Refusing antidepressant trial at this time - Would benefit from [facility] or residential rehab but currently refusing - TSH 2.079   EtOH use d/o - severe dependence: -Completed CIWA with Ativan, last score 5 -continue gabapentin to 300 mg TID for potential cravings/withdrawal -Continue phenergan 12.5mg  PO Q6H PRN + Scopalamine patch 1.5mg  transdermal /72 hours -Start doxepin 10 mg tablet nightly to help with nausea and insomnia. -Continue Thiamine 100 mg daily -Continue Folic Acid 1 mg daily -Continue  Multivitamin daily -Continue Protonix 20 mg BID  Insomnia -doxepin 10mg  PO QHS - discontinued trazodone (aggravating nausea?)  Constipation -Milk of magnesia PRN. Encouraging PO fluid intake.   Hyponatremia -Sodium 134, potassium 4.1, magnesium 2.1, patient will follow up with primary care after discharge. - EKG NSR 78bpm with QTC 08-10-1974 -Encouraged to drink Ensure.  HIV  Leukopenia -Absol Neut up to  2.1 on recheck with WBC improved from 3.1-3.6 -CD4 and viral load drawn-will need new sample.-Ordered again -Continue Biktarvy 1 tablet daily after lunch -Patient will need f/u with ID after discharge.  -Continue PRN's: Tylenol, Maalox, Atarax, Milk of Magnesia, Trazodone   , MD  07/30/2021, 4:37 PM

## 2021-07-30 NOTE — [facility] Group Notes (Signed)
[facility] Group Notes:  (Nursing/MHT/Case Management/Adjunct)  Date:  07/30/2021  Time:  4:40 PM  Type of Therapy:  Group Therapy  Participation Level:  Minimal  Participation Quality:  Appropriate  Affect:  Appropriate  Cognitive:  Appropriate  Insight:  Appropriate  Engagement in Group:  Limited  Modes of Intervention:  Discussion  Summary of Progress/Problems: Pt was given "Personal Acceptance" worksheet exercise with key points to remember and discuss.  Jaquita Rector 07/30/2021, 4:40 PM

## 2021-07-30 NOTE — [facility] Group Notes (Signed)
[facility] Group Notes:  (Nursing/MHT/Case Management/Adjunct)  Date:  07/30/2021  Time:  9:21 PM  Type of Therapy:   Wrap-up Group[  Participation Level:  Active  Participation Quality:  Attentive  Affect:  Appropriate  Cognitive:  Appropriate  Insight:  Improving  Engagement in Group:  Developing/Improving  Modes of Intervention:  Discussion  Summary of Progress/Problems: PT was attentive and active in group. Tmparment was normal.   Lorita Officer 07/30/2021, 9:21 PM

## 2021-07-30 NOTE — Group Note (Signed)
Recreation Therapy Group Note   Group Topic:Stress Management  Group Date: 07/30/2021 Start Time: 0930 End Time: 0945 Facilitators: Caroll Rancher, LRT/CTRS Location: [address] Dayroom  Goal Area(s) Addresses:  Patient will actively participate in stress management techniques presented during session.  Patient will successfully identify benefit of practicing stress management post d/c.   Group Description: Progressive Muscle Relaxation. LRT provided education, instruction and demonstration on practice of Progressive Muscle Relaxation. Patient was asked to participate in technique introduced during session. Patient was to tense each muscle then release the tension. Patient was asked to pay attention to their body and if they felt any discomfort, to skip those areas during the session.  LRT informed pts about resources to access pre-recorded scripts for PMR post d/c via Youtube and other apps or via internet with a smartphone, tablet, and/or computer.   Affect/Mood: N/A   Participation Level: Did not attend    Clinical Observations/Individualized Feedback: Pt did not attend group session.    Plan: Continue to engage patient in RT group sessions 2-3x/week.   Caroll Rancher, LRT/CTRS 07/30/2021 11:43 AM

## 2021-07-30 NOTE — Group Note (Signed)
LCSW Group Therapy Note   Group Date: 07/30/2021 Start Time: 1300 End Time: 1345  [facility] LCSW Group Therapy  Type of Therapy: Group Therapy Type of Therapy:  Group Therapy  Participation Level:  Active  Modes of Intervention: Clarification, Confrontation, Discussion, Education, Exploration, Limit-setting, Orientation, Problem-solving, Rapport Building, Dance movement psychotherapist, Socialization and Support  Summary of Progress/Problems: The topic for group today was emotional regulation. This group focused on both positive and negative emotion identification and allowed group members to process ways to identify feelings, regulate negative emotions, and find healthy ways to manage internal/external emotions. Group members were asked to reflect on a time when their reaction to an emotion led to a negative outcome and explored how alternative responses using emotion regulation would have benefited them. Group members were also asked to discuss a time when emotion regulation was utilized when a negative emotion was experienced.      Felizardo Hoffmann, LCSWA 07/30/2021  1:51 PM

## 2021-07-30 NOTE — Progress Notes (Signed)
Patient alert and oriented x 3 denies SI/HI/A/VH c/o Nausea prn  Promethazine 12.5 mg at 1209 and effective by 1300. Patient c/o constipation at 1535 prn milk of magnesium given and not yet effective. Pt encouraged to drink more fluids. Support and encouragement provided as needed.

## 2021-07-30 NOTE — [facility] Group Notes (Signed)
[facility] Group Notes:  (Nursing/MHT/Case Management/Adjunct)  Date:  07/30/2021  Time:  9:50 AM  Type of Therapy:  Group Therapy  Participation Level:  Active  Participation Quality:  Appropriate  Affect:  Appropriate  Cognitive:  Appropriate  Insight:  Appropriate  Engagement in Group:  Engaged  Modes of Intervention:  Discussion  Summary of Progress/Problems:Staff had discussion with pt about orientation of the unit and goals for the day.  Gary [name] 07/30/2021, 9:50 AM

## 2021-07-31 MED ORDER — PANTOPRAZOLE SODIUM 20 MG PO TBEC: 20.0000 mg | DELAYED_RELEASE_TABLET | Freq: Two times a day (BID) | ORAL | 0 refills | Status: DC

## 2021-07-31 MED ORDER — GABAPENTIN 300 MG PO CAPS: 300.0000 mg | ORAL_CAPSULE | Freq: Three times a day (TID) | ORAL | 2 refills | Status: DC

## 2021-07-31 MED ORDER — THIAMINE HCL 100 MG PO TABS: 100.0000 mg | ORAL_TABLET | Freq: Every day | ORAL | 0 refills | Status: DC

## 2021-07-31 MED ORDER — ADULT MULTIVITAMIN W/MINERALS CH
1.0000 | ORAL_TABLET | Freq: Every day | ORAL | Status: DC
Start: 2021-07-31 — End: 2021-08-30

## 2021-07-31 MED ORDER — BICTEGRAVIR-EMTRICITAB-TENOFOV 50-200-25 MG PO TABS: 1.0000 | ORAL_TABLET | Freq: Every day | ORAL | 0 refills | Status: AC

## 2021-07-31 MED ORDER — FOLIC ACID 1 MG PO TABS: 1.0000 mg | ORAL_TABLET | Freq: Every day | ORAL | Status: DC

## 2021-07-31 MED ORDER — DOXEPIN HCL 10 MG PO CAPS: 10.0000 mg | ORAL_CAPSULE | Freq: Every day | ORAL | 0 refills | Status: DC

## 2021-07-31 NOTE — [facility] Group Notes (Signed)
Pt attended goals group and participated in discussion. 

## 2021-07-31 NOTE — Discharge Summary (Addendum)
Physician Discharge Summary Note  Patient:  Gary [name] is an 35 y.o., male MRN:  [id number] DOB:  01-28-1986 Patient phone:  [phone] (home)  Patient address:   [facility] [place] [address],  Total Time spent with patient: 20 minutes  Date of Admission:  07/26/2021 Date of Discharge: 07/31/21  Reason for Admission:  Mr. Kanas is a 35 yr old male who presents with SI and EtOH abuse.  PPHx is significant for MDD, Recurrent, Severe, severe EtOH abuse, multiple ED visits and rehab stays.  Principal Problem: Alcohol-induced mood disorder [facility]) Discharge Diagnoses: Principal Problem:   Alcohol-induced mood disorder (HCC) Active Problems:   HIV disease (HCC)   Alcohol use disorder, severe, dependence (HCC)   Thrombocytopenia (HCC)   Neutropenia (HCC)   Constipation   Alcohol-induced insomnia (HCC)   Past Psychiatric History: MDD, Recurrent, Severe, severe EtOH abuse, multiple ED visits and rehab stays.  Past Medical History:  Past Medical History:  Diagnosis Date   Alcohol abuse    HIV (human immunodeficiency virus infection) (HCC)    Seizures (HCC)    Subdural hematoma (HCC)     Past Surgical History:  Procedure Laterality Date   INCISION AND DRAINAGE PERIRECTAL ABSCESS N/A 09/24/2016   Procedure: IRRIGATION AND DEBRIDEMENT PERIRECTAL ABSCESS;  Surgeon: Ricarda Frame, MD;  Location: [facility] ORS;  Service: General;  Laterality: N/A;   none     Family History:  Family History  Problem Relation Age of Onset   Alcohol abuse Mother    Alcohol abuse Father    Family Psychiatric  History: Brother- Bipolar Disorder and EtOH.  Social History:  Social History   Substance and Sexual Activity  Alcohol Use Yes   Comment: "I drink all I can"     Social History   Substance and Sexual Activity  Drug Use Yes   Types: Methamphetamines, Marijuana   Comment: last used 04/10/2019    Social History   Socioeconomic History   Marital status: Single    Spouse name:  Not on file   Number of children: Not on file   Years of education: Not on file   Highest education level: Not on file  Occupational History   Occupation: unemployed  Tobacco Use   Smoking status: Every Day    Packs/day: 0.50    Types: Cigarettes   Smokeless tobacco: Never   Tobacco comments:    unable to smoke while incarcerated 6+ months 02/13/20  Vaping Use   Vaping Use: Never used  Substance and Sexual Activity   Alcohol use: Yes    Comment: "I drink all I can"   Drug use: Yes    Types: Methamphetamines, Marijuana    Comment: last used 04/10/2019   Sexual activity: Yes    Partners: Male, Male    Comment: declined condoms  03/2021  Other Topics Concern   Not on file  Social History Narrative   "Currently living on the streets"   Independent at baseline   Social Determinants of Health   Financial Resource Strain: Not on file  Food Insecurity: Not on file  Transportation Needs: Not on file  Physical Activity: Not on file  Stress: Not on file  Social Connections: Not on file    Hospital Course:  On admission patient was integrated into therapeutic milieu and placed on appropriate precautions. Pt was started on CIWA with scheduled Ativan taper, thiamine, folic acid, as needed Zofran for nausea and Imodium for diarrhea.  Patient was also started on HIV medication Biktarvy  daily.  Patient declined medication for depression.  Patient was offered referrals for substance abuse inpatient rehab or outpatient program but he declined.  Patient complained of severe nausea. Zofran was increased to 8 mg which was ineffective.  Zofran was stopped and Phenergan was started which was increased to 12.5 mg.  Due to continued complaint of nausea, patient was put on scopolamine patch which did not help with his nausea.  Patient was started on doxepin 10 mg nightly to help with sleep and nausea. Patient's nausea improved significantly. Pt's mood, sleep, affect and anxiety improved while his time at  the inpatient unit.Pt was seen daily on rounds, and case discussed at multidisciplinary team meeting to ensure biopsychosocial approach to treatment. Pt was educated on need for compliance with his medications. Pt was also educated on need to abstain from use of any drugs and alcohol. During his stay Patient did not display any dangerous, violent or suicidal behavior on the unit.  Pt interacted with patients & staff appropriately, participated appropriately in the group sessions/therapies. Pt's medications were addressed & adjusted to meet his needs. Pt was recommended for outpatient follow-up care & medication management upon discharge to assure his continuity of care.  Patient was recommended to follow-up with infectious disease for HIV treatment.  Referral was also provided for PCP. At the time of discharge, patient is not reporting any acute suicidal/homicidal ideations. Pt reports improved mood, sleep, anxiety and nausea. Pt currently denies any new issues or concerns. Education and supportive counseling provided throughout her hospital stay & upon discharge.  Patient is discharged to his friend's place.  Patient states his friend does not drink alcohol, uses drugs and does not have access to guns.  Physical Findings: AIMS: Facial and Oral Movements Muscles of Facial Expression: None, normal Lips and Perioral Area: None, normal Jaw: None, normal Tongue: None, normal,Extremity Movements Upper (arms, wrists, hands, fingers): None, normal Lower (legs, knees, ankles, toes): None, normal, Trunk Movements Neck, shoulders, hips: None, normal, Overall Severity Severity of abnormal movements (highest score from questions above): None, normal Incapacitation due to abnormal movements: None, normal Patient's awareness of abnormal movements (rate only patient's report): No Awareness, Dental Status Current problems with teeth and/or dentures?: No Does patient usually wear dentures?: No  CIWA:  CIWA-Ar  Total: 0 COWS:     Musculoskeletal: Strength & Muscle Tone: within normal limits Gait & Station: normal Patient leans: N/A   Psychiatric Specialty Exam:  Presentation  General Appearance: Appropriate for Environment  Eye Contact:Good  Speech:Clear and Coherent  Speech Volume:Normal  Handedness:Right   Mood and Affect  Mood:Euthymic  Affect:Congruent   Thought Process  Thought Processes:Coherent; Goal Directed  Descriptions of Associations:Intact  Orientation:Full (Time, Place and Person)  Thought Content:WDL  History of Schizophrenia/Schizoaffective disorder:No  Duration of Psychotic Symptoms:Less than six months  Hallucinations:Hallucinations: None  Ideas of Reference:None  Suicidal Thoughts:Suicidal Thoughts: No SI Passive Intent and/or Plan: -- (None today)  Homicidal Thoughts:Homicidal Thoughts: No   Sensorium  Memory:Immediate Fair; Recent Fair  Judgment:Poor  Insight:Poor   Executive Functions  Concentration:Good  Attention Span:Good  Recall:Good  Fund of Knowledge:Fair  Language:Good   Psychomotor Activity  Psychomotor Activity:Psychomotor Activity: Tremor   Assets  Assets:Communication Skills; Social Support; Resilience   Sleep  Sleep:Sleep: Fair Number of Hours of Sleep: 6.75    Physical Exam: Physical Exam Vitals and nursing note reviewed.  Constitutional:      General: He is not in acute distress.    Appearance: Normal appearance. He  is not ill-appearing, toxic-appearing or diaphoretic.  HENT:     Head: Normocephalic and atraumatic.     Nose: Nose normal.  Eyes:     Extraocular Movements: Extraocular movements intact.  Pulmonary:     Effort: Pulmonary effort is normal.  Neurological:     General: No focal deficit present.     Mental Status: He is alert and oriented to person, place, and time.   Review of Systems  Constitutional:  Negative for chills and fever.  Eyes:  Negative for blurred vision.   Respiratory:  Negative for cough and shortness of breath.   Cardiovascular:  Negative for chest pain.  Gastrointestinal:  Negative for abdominal pain, diarrhea, nausea and vomiting.  Neurological:  Negative for dizziness and headaches.  Psychiatric/Behavioral:  Negative for depression, hallucinations and suicidal ideas. The patient is not nervous/anxious.   Blood pressure 115/65, pulse 69, temperature 98.7 F (37.1 C), temperature source Oral, resp. rate 18, height 6\' 1"  (1.854 m), weight 72.6 kg, SpO2 100 %. Body mass index is 21.11 kg/m.   Social History   Tobacco Use  Smoking Status Every Day   Packs/day: 0.50   Types: Cigarettes  Smokeless Tobacco Never  Tobacco Comments   unable to smoke while incarcerated 6+ months 02/13/20   Tobacco Cessation:  N/A, patient does not currently use tobacco products   Blood Alcohol level:  Lab Results  Component Value Date   ETH 231 (H) 07/24/2021   ETH 308 (HH) 07/20/2021    Metabolic Disorder Labs:  Lab Results  Component Value Date   HGBA1C 4.3 (L) 07/26/2021   MPG 76.71 07/26/2021   MPG 99.67 03/12/2021   No results found for: PROLACTIN Lab Results  Component Value Date   CHOL 198 07/26/2021   TRIG 209 (H) 07/26/2021   HDL 52 07/26/2021   CHOLHDL 3.8 07/26/2021   VLDL 42 (H) 07/26/2021   LDLCALC 104 (H) 07/26/2021   LDLCALC 79 09/19/2019    See Psychiatric Specialty Exam and Suicide Risk Assessment completed by Attending Physician prior to discharge.  Discharge destination:  Home  Is patient on multiple antipsychotic therapies at discharge:  No   Has Patient had three or more failed trials of antipsychotic monotherapy by history:  No  Recommended Plan for Multiple Antipsychotic Therapies: NA  Discharge Instructions     Diet - low sodium heart healthy   Complete by: As directed    Increase activity slowly   Complete by: As directed       Allergies as of 07/31/2021       Reactions   Tegretol [carbamazepine]  Other (See Comments)   Caused vertigo for 2 days after taking it   Caffeine Palpitations        Medication List     STOP taking these medications    chlordiazePOXIDE 25 MG capsule Commonly known as: LIBRIUM   ondansetron 4 MG disintegrating tablet Commonly known as: Zofran ODT       TAKE these medications      Indication  bictegravir-emtricitabine-tenofovir AF 50-200-25 MG Tabs tablet Commonly known as: BIKTARVY Take 1 tablet by mouth daily after lunch.  Indication: HIV Disease   doxepin 10 MG capsule Commonly known as: SINEQUAN Take 1 capsule (10 mg total) by mouth at bedtime.  Indication: Insomnia and nausea   folic acid 1 MG tablet Commonly known as: FOLVITE Take 1 tablet (1 mg total) by mouth daily. 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  Indication: Alcohol Withdrawal Syndrome   multivitamin with minerals Tabs tablet Take 1 tablet by mouth daily.  Indication: fatigue   pantoprazole 20 MG tablet Commonly known as: PROTONIX Take 1 tablet (20 mg total) by mouth 2 (two) times daily.  Indication: Heartburn   thiamine 100 MG tablet Take 1 tablet (100 mg total) by mouth daily. 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   Why: You may walk-into this facility Monday - Friday from 9 am to 2 pm for medication management, therapy, IOP and substance use treatment services. Contact information: [address] [place] [address] [phone]         [facility]. Call.   Specialty: Infectious Diseases Why: Please call to obtain information about scheduling an appointment with this provider. Contact information: [address] [place], [address] [id number] mc [place] [address] [phone]        [facility]. Call.   Why: Please call to schedule an appointment with this provider for primary care services and to obtain any necessary referral to the [facility] clinic. Contact information: [address], [address] [place] [address]                Follow-up recommendations:  Activity:  As tolerated  Comments:  Prescriptions sent to pharmacy at discharge. Patient agreeable to plan.  Given opportunity to ask questions.  Appears to feel comfortable with discharge denies any current suicidal or homicidal thought. Patient is also instructed prior to discharge to: Take all medications as prescribed by his mental healthcare provider. Report any adverse effects and or reactions from the medicines to his outpatient provider promptly. Patient has been instructed & cautioned: To not engage in alcohol and or illegal drug use while on prescription medicines. In the event of worsening symptoms, patient is instructed to call the crisis hotline, 911 and or go to the nearest ED for appropriate evaluation and treatment of symptoms. To follow-up with his primary care provider for your other medical issues, concerns and or health care needs.   Signed: Karsten Ro, MD PGY2 07/31/2021, 2:01 PM

## 2021-07-31 NOTE — [facility] Suicide Risk Assessment (Signed)
[facility] Discharge Suicide Risk Assessment   Principal Problem: Alcohol-induced mood disorder (HCC) Discharge Diagnoses: Principal Problem:   Alcohol-induced mood disorder (HCC) Active Problems:   HIV disease (HCC)   Alcohol use disorder, severe, dependence (HCC)   Thrombocytopenia (HCC)   Neutropenia (HCC)   Constipation   Alcohol-induced insomnia (HCC)   Total Time spent with patient: 30 minutes  Musculoskeletal: Strength & Muscle Tone: within normal limits Gait & Station: normal Patient leans: N/A  Psychiatric Specialty Exam  Presentation  General Appearance: Appropriate for Environment  Eye Contact:Good  Speech:Clear and Coherent  Speech Volume:Normal  Handedness:Right   Mood and Affect  Mood:Euthymic  Duration of Depression Symptoms: Greater than two weeks  Affect:Congruent   Thought Process  Thought Processes:Coherent; Goal Directed  Descriptions of Associations:Intact  Orientation:Full (Time, Place and Person)  Thought Content:WDL  History of Schizophrenia/Schizoaffective disorder:No  Duration of Psychotic Symptoms:Less than six months  Hallucinations:Hallucinations: None  Ideas of Reference:None  Suicidal Thoughts:Suicidal Thoughts: No SI Passive Intent and/or Plan: -- (None today)  Homicidal Thoughts:Homicidal Thoughts: No   Sensorium  Memory:Immediate Fair; Recent Fair  Judgment:Poor  Insight:Poor   Executive Functions  Concentration:Good  Attention Span:Good  Recall:Good  Fund of Knowledge:Fair  Language:Good   Psychomotor Activity  Psychomotor Activity:Psychomotor Activity: Tremor   Assets  Assets:Communication Skills; Social Support; Resilience   Sleep  Sleep:Sleep: Fair Number of Hours of Sleep: 6.75   Physical Exam: Physical Exam Vitals and nursing note reviewed.  Constitutional:      Appearance: Normal appearance.  HENT:     Head: Normocephalic and atraumatic.     Nose: Nose normal.  Eyes:      Extraocular Movements: Extraocular movements intact.  Cardiovascular:     Rate and Rhythm: Normal rate.  Pulmonary:     Effort: Pulmonary effort is normal.  Musculoskeletal:        General: Normal range of motion.     Cervical back: Normal range of motion.  Neurological:     General: No focal deficit present.     Mental Status: He is alert and oriented to person, place, and time.  Psychiatric:        Attention and Perception: Attention and perception normal.        Mood and Affect: Mood and affect normal.        Speech: Speech normal.        Behavior: Behavior normal. Behavior is cooperative.        Thought Content: Thought content normal.        Cognition and Memory: Cognition and memory normal.        Judgment: Judgment normal.   ROS Blood pressure 115/65, pulse 69, temperature 98.7 F (37.1 C), temperature source Oral, resp. rate 18, height 6\' 1"  (1.854 m), weight 72.6 kg, SpO2 100 %. Body mass index is 21.11 kg/m.  Mental Status Per Nursing Assessment::   On Admission:  Suicidal ideation indicated by patient  Demographic Factors:  Male, Caucasian, Low socioeconomic status, and Unemployed  Loss Factors: Decline in physical health and Financial problems/change in socioeconomic status  Historical Factors: Prior suicide attempts and Family history of mental illness or substance abuse  Risk Reduction Factors:   NA  Continued Clinical Symptoms:  Dysthymia Alcohol/Substance Abuse/Dependencies Previous Psychiatric Diagnoses and Treatments Medical Diagnoses and Treatments/Surgeries  Cognitive Features That Contribute To Risk:  Closed-mindedness    Suicide Risk:  Mild:  Suicidal ideation of limited frequency, intensity, duration, and specificity.  There are no identifiable plans, no  associated intent, mild dysphoria and related symptoms, good self-control (both objective and subjective assessment), few other risk factors, and identifiable protective factors, including  available and accessible social support.   Follow-up Information     [facility] Follow up.   Why: You may walk-into this facility Monday - Friday from 9 am to 2 pm for medication management, therapy, IOP and substance use treatment services. Contact information: [address] [place] [address] [phone]         [facility]. Call.   Specialty: Infectious Diseases Why: Please call to obtain information about scheduling an appointment with this provider. Contact information: [address] [place], [address] [id number] mc [place] [address] [phone]        [facility]. Call.   Why: Please call to schedule an appointment with this provider for primary care services and to obtain any necessary referral to the [facility]. Contact information: [address], [address] [place] [address]                Plan Of Care/Follow-up recommendations:  Activity:  as tolerated Diet:  regular.  Roselle Locus, MD 07/31/2021, 11:51 AM

## 2021-07-31 NOTE — Progress Notes (Signed)
  [facility] Adult Case Management Discharge Plan :  Will you be returning to the same living situation after discharge:  No. Friend's house At discharge, do you have transportation home?: No. Bus passes Do you have the ability to pay for your medications: No.  Release of information consent forms completed and in the chart;  Patient's signature needed at discharge.  Patient to Follow up at:  Follow-up Information     [facility] Follow up.   Why: You may walk-into this facility Monday - Friday from 9 am to 2 pm for medication management, therapy, IOP and substance use treatment services. Contact information: [address] [place] [address] [phone]         [facility]. Call.   Specialty: Infectious Diseases Why: Please call to obtain information about scheduling an appointment with this provider. Contact information: [address] [place], [address] [id number] mc [place] [address] [phone]        [facility]. Call.   Why: Please call to schedule an appointment with this provider for primary care services and to obtain any necessary referral to the [facility]. Contact information: [address], [address] [place] [address]                Next level of care provider has access to [facility] and Suicide Prevention discussed: Yes,  w/ pt     Has patient been referred to the Quitline?: N/A patient is not a smoker  Patient has been referred for addiction treatment: Pt. refused referral  Chrys Racer 07/31/2021, 10:43 AM

## 2021-07-31 NOTE — Plan of Care (Signed)
Discharge note  Patient verbalizes readiness for discharge. Follow up plan explained, AVS, Transition record and SRA given. Prescriptions and teaching provided. Belongings returned and signed for. Suicide safety plan completed and signed. Patient verbalizes understanding. Patient denies SI/HI and assures this Clinical research associate they will seek assistance should that change. Patient discharged to lobby with bus passes.  Problem: Education: Goal: Knowledge of [facility] Education information/materials will improve 07/31/2021 1440 by Gary Miyamoto, RN Outcome: Adequate for Discharge 07/31/2021 [phone] by Gary Miyamoto, RN Outcome: Progressing Goal: Emotional status will improve 07/31/2021 1440 by Gary Miyamoto, RN Outcome: Adequate for Discharge 07/31/2021 0934 by Gary Miyamoto, RN Outcome: Progressing Goal: Mental status will improve 07/31/2021 1440 by Gary Miyamoto, RN Outcome: Adequate for Discharge 07/31/2021 0934 by Gary Miyamoto, RN Outcome: Progressing Goal: Verbalization of understanding the information provided will improve Outcome: Adequate for Discharge   Problem: Activity: Goal: Interest or engagement in activities will improve Outcome: Adequate for Discharge Goal: Sleeping patterns will improve Outcome: Adequate for Discharge   Problem: Coping: Goal: Ability to verbalize frustrations and anger appropriately will improve Outcome: Adequate for Discharge Goal: Ability to demonstrate self-control will improve Outcome: Adequate for Discharge   Problem: Health Behavior/Discharge Planning: Goal: Identification of resources available to assist in meeting health care needs will improve Outcome: Adequate for Discharge Goal: Compliance with treatment plan for underlying cause of condition will improve Outcome: Adequate for Discharge   Problem: Physical Regulation: Goal: Ability to maintain clinical measurements within normal limits will improve Outcome:  Adequate for Discharge   Problem: Safety: Goal: Periods of time without injury will increase Outcome: Adequate for Discharge   Problem: Education: Goal: Ability to make informed decisions regarding treatment will improve Outcome: Adequate for Discharge   Problem: Coping: Goal: Coping ability will improve Outcome: Adequate for Discharge   Problem: Health Behavior/Discharge Planning: Goal: Identification of resources available to assist in meeting health care needs will improve Outcome: Adequate for Discharge   Problem: Medication: Goal: Compliance with prescribed medication regimen will improve Outcome: Adequate for Discharge   Problem: Self-Concept: Goal: Ability to disclose and discuss suicidal ideas will improve Outcome: Adequate for Discharge Goal: Will verbalize positive feelings about self Outcome: Adequate for Discharge   Problem: Education: Goal: Knowledge of disease or condition will improve Outcome: Adequate for Discharge Goal: Understanding of discharge needs will improve Outcome: Adequate for Discharge   Problem: Health Behavior/Discharge Planning: Goal: Ability to identify changes in lifestyle to reduce recurrence of condition will improve Outcome: Adequate for Discharge Goal: Identification of resources available to assist in meeting health care needs will improve Outcome: Adequate for Discharge   Problem: Physical Regulation: Goal: Complications related to the disease process, condition or treatment will be avoided or minimized Outcome: Adequate for Discharge   Problem: Safety: Goal: Ability to remain free from injury will improve Outcome: Adequate for Discharge   Problem: Education: Goal: Ability to state activities that reduce stress will improve Outcome: Adequate for Discharge   Problem: Coping: Goal: Ability to identify and develop effective coping behavior will improve Outcome: Adequate for Discharge   Problem: Self-Concept: Goal: Ability to  identify factors that promote anxiety will improve Outcome: Adequate for Discharge Goal: Level of anxiety will decrease Outcome: Adequate for Discharge Goal: Ability to modify response to factors that promote anxiety will improve Outcome: Adequate for Discharge

## 2021-07-31 NOTE — Plan of Care (Signed)
  Problem: Education: Goal: Knowledge of [facility] Education information/materials will improve Outcome: Progressing Goal: Emotional status will improve Outcome: Progressing Goal: Mental status will improve Outcome: Progressing   

## 2021-08-01 LAB — HIV-1 RNA QUANT-NO REFLEX-BLD
HIV 1 RNA Quant: 40 copies/mL
LOG10 HIV-1 RNA: 1.602 log10copy/mL

## 2021-08-05 ENCOUNTER — Emergency Department (HOSPITAL_COMMUNITY): Payer: Self-pay

## 2021-08-05 ENCOUNTER — Other Ambulatory Visit: Payer: Self-pay

## 2021-08-05 ENCOUNTER — Emergency Department (HOSPITAL_COMMUNITY)
Admission: EM | Admit: 2021-08-05 | Discharge: 2021-08-05 | Disposition: A | Discharge status: Home / Self Care | Payer: Self-pay | Attending: Emergency Medicine | Admitting: Emergency Medicine

## 2021-08-05 ENCOUNTER — Encounter (HOSPITAL_COMMUNITY): Payer: Self-pay

## 2021-08-05 DIAGNOSIS — Z21 Asymptomatic human immunodeficiency virus [HIV] infection status: Secondary | ICD-10-CM | POA: Insufficient documentation

## 2021-08-05 DIAGNOSIS — F1010 Alcohol abuse, uncomplicated: Secondary | ICD-10-CM | POA: Insufficient documentation

## 2021-08-05 DIAGNOSIS — F10220 Alcohol dependence with intoxication, uncomplicated: Secondary | ICD-10-CM | POA: Insufficient documentation

## 2021-08-05 DIAGNOSIS — R7309 Other abnormal glucose: Secondary | ICD-10-CM | POA: Insufficient documentation

## 2021-08-05 DIAGNOSIS — X58XXXA Exposure to other specified factors, initial encounter: Secondary | ICD-10-CM | POA: Insufficient documentation

## 2021-08-05 DIAGNOSIS — F17210 Nicotine dependence, cigarettes, uncomplicated: Secondary | ICD-10-CM | POA: Insufficient documentation

## 2021-08-05 DIAGNOSIS — S0101XA Laceration without foreign body of scalp, initial encounter: Secondary | ICD-10-CM | POA: Insufficient documentation

## 2021-08-05 LAB — CBG MONITORING, ED: Glucose-Capillary: 73 mg/dL (ref 70–99)

## 2021-08-05 IMAGING — CT CT HEAD W/O CM
3 series · 15 of 47 positions shown, 18 images · non-contrast
Comparison: CT head [DATE]

CLINICAL DATA: Alcohol withdrawal, trauma

EXAM:
CT HEAD WITHOUT CONTRAST
TECHNIQUE: Contiguous axial images were obtained from the base of the skull
through the vertex without intravenous contrast.

[Series 2: head wo · axial · 0.48mm/px · z∈[+1416,+1551]mm · 9 of 33 slices shown, 12 images]
[im 3/33  brain]
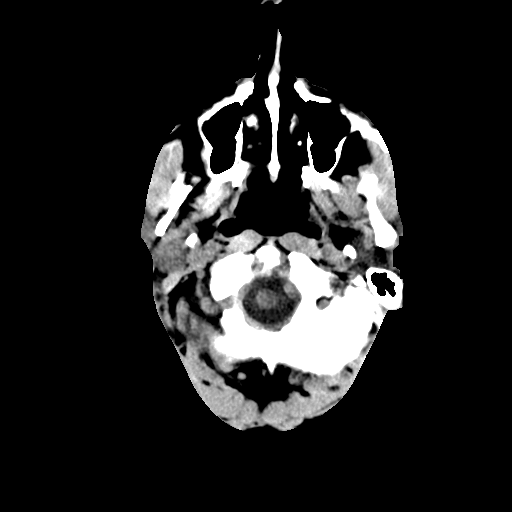
[im 3/33  bone]
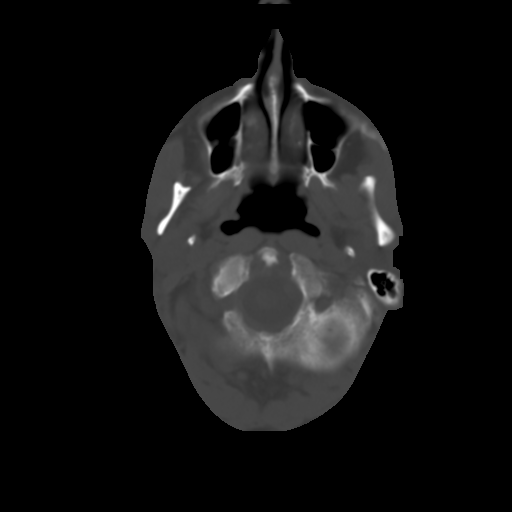
[im 6/33  brain]
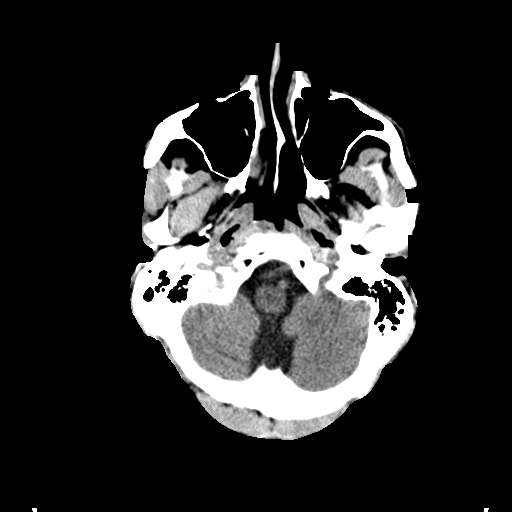
[im 9/33  brain]
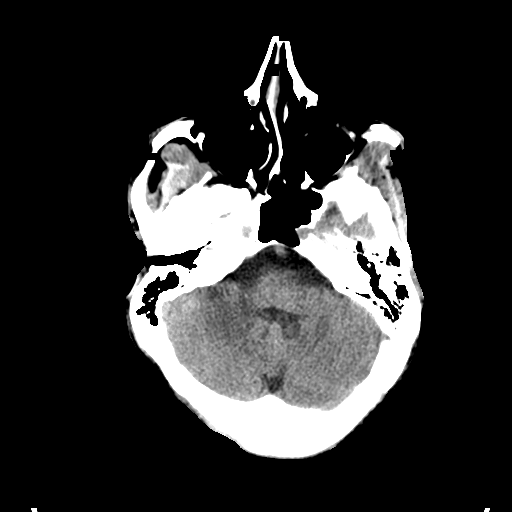
[im 13/33  brain]
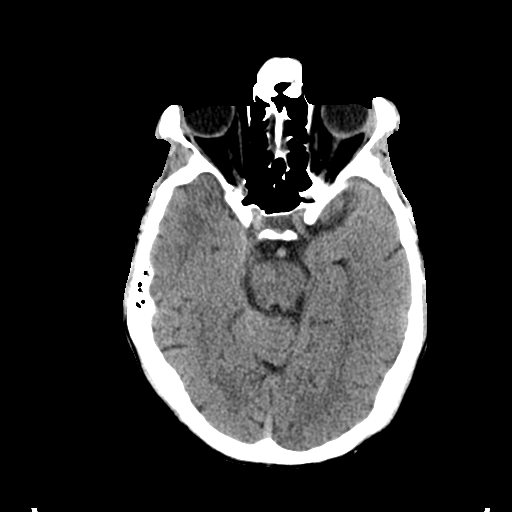
[im 17/33  brain]
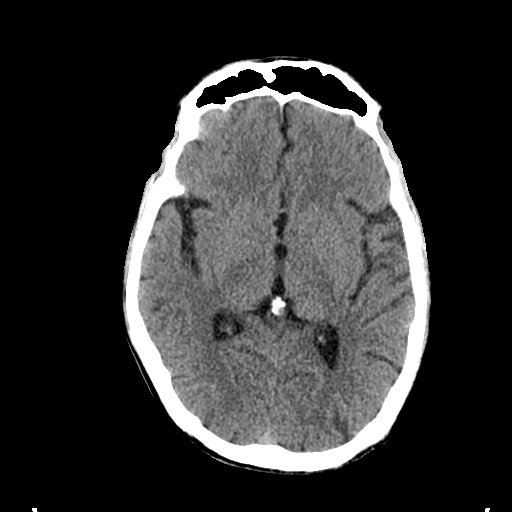
[im 17/33  bone]
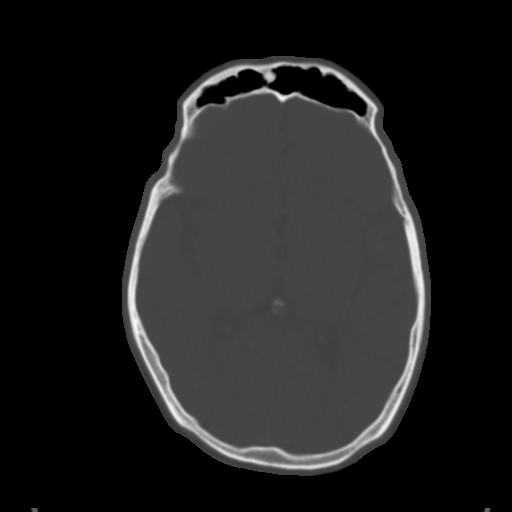
[im 20/33  brain]
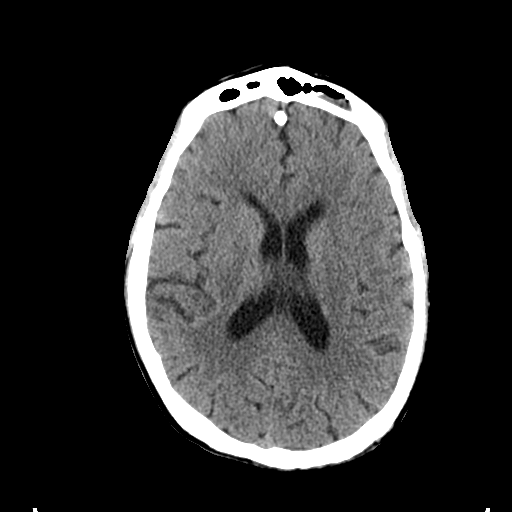
[im 24/33  brain]
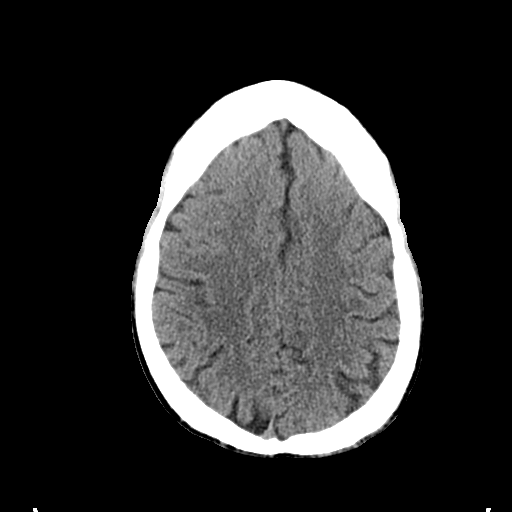
[im 27/33  brain]
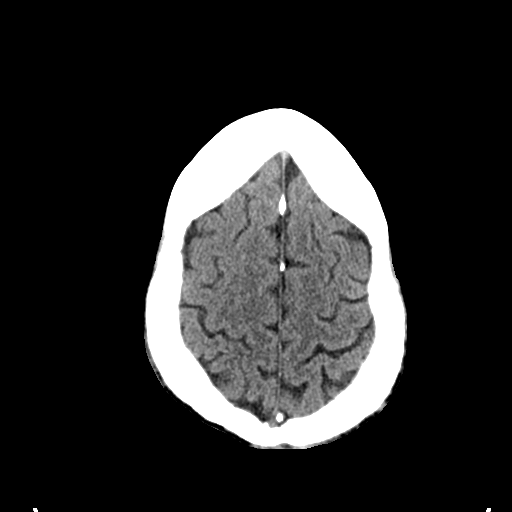
[im 30/33  brain]
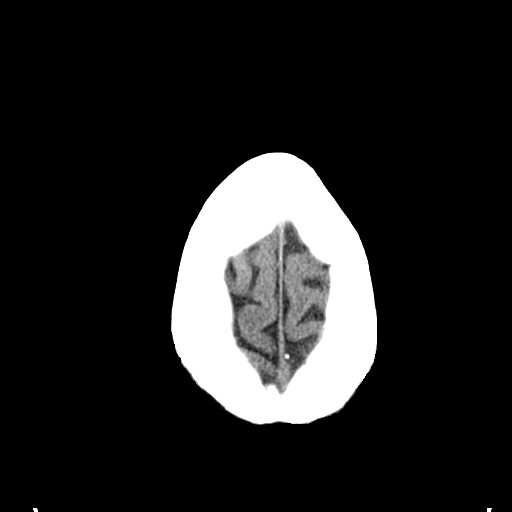
[im 30/33  bone]
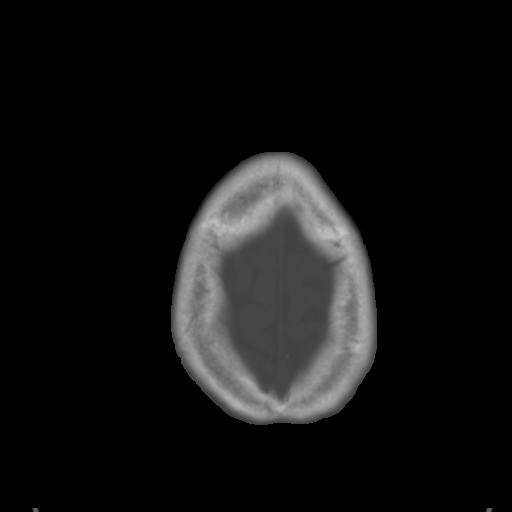

[Series 4: coronal soft tissue · coronal · 0.38mm/px · 3 of 84 slices shown]
[im 28/84  brain]
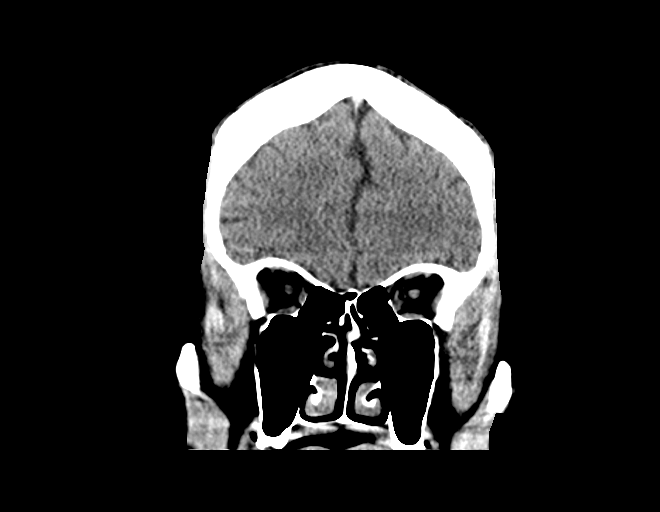
[im 37/84  brain]
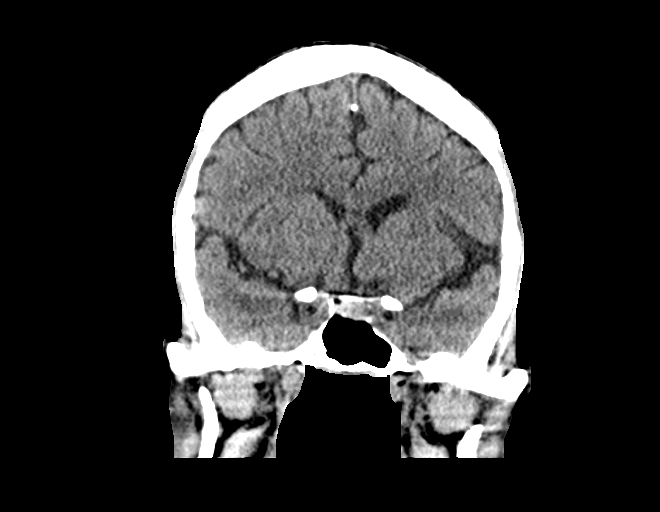
[im 47/84  brain]
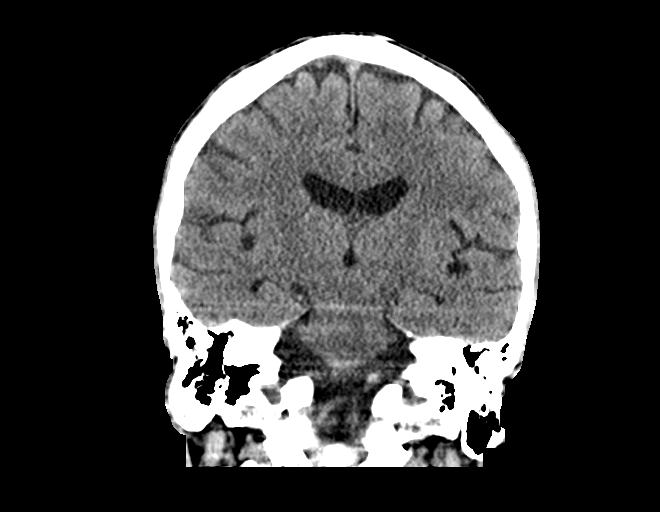

[Series 5: sagittal soft tissue · sagittal · 0.37mm/px · 3 of 53 slices shown]
[im 18/53  brain]
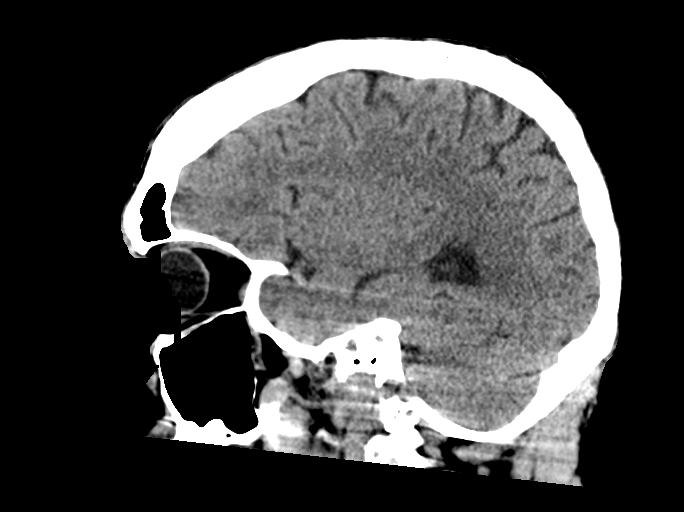
[im 27/53  brain]
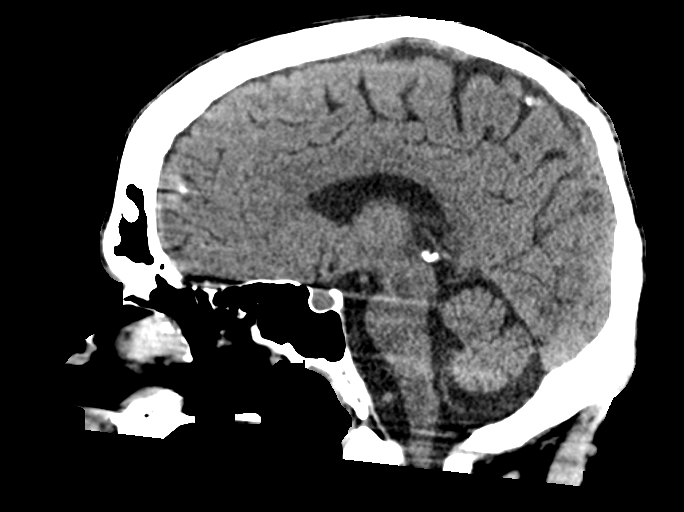
[im 35/53  brain]
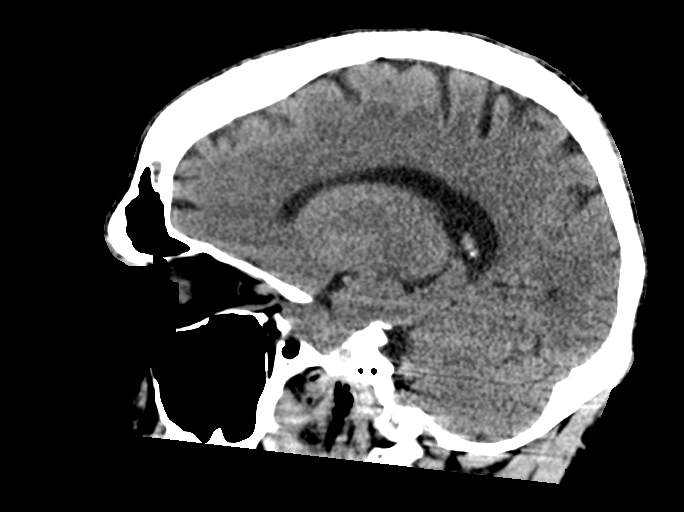

[15 of 47 positions shown; findings below may reference images not displayed]

FINDINGS: Brain: There is no acute intracranial hemorrhage, extra-axial fluid
collection, or acute infarct. The ventricles are stable in size.
There is no mass lesion. There is no midline shift.

Vascular: No hyperdense vessel or unexpected calcification.

Skull: Normal. Negative for fracture or focal lesion.

Sinuses/Orbits: There is mild mucosal thickening in the left frontal
sinus. The other paranasal sinuses are clear. The globes and orbits
are unremarkable.

Other: None.
IMPRESSION: Normal head CT.

## 2021-08-05 NOTE — ED Provider Notes (Signed)
[facility]-EMERGENCY DEPT Provider Note   CSN: [id number] Arrival date & time: 08/05/21  1357     History Chief Complaint  Patient presents with   Withdrawal    alcohol    Gary [name] is a 35 y.o. male.  HPI  HPI will be deferred due to level 5 caveat altered mental status  Patient with an extensive history of alcohol abuse, HIV, seizures, subdural hematoma presents to the emergency department intoxicated.  Patient is a poor historian was somnolent on my exam but arousable.  He does not endorse any pain at this time.  He states that he drank a couple bottles of wine around 7 AM today his last drink was approximate few hours ago.  He has no complaints.  He is unsure why he is here.  He does not endorse suicidal or homicidal ideations, denies hallucinations or delusions.  After reviewing patient's chart patient has been seen here extensively generally for intoxication and/or suicidal ideations.  Last time he was here was on 09/15 and was later discharged.  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   Elevated troponin 10/19/2020   Alcohol withdrawal (HCC) 10/19/2020   Alcohol abuse    Suicidal ideation 06/14/2020   Pancreatitis 06/13/2020   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   Malnutrition of moderate degree 05/07/2020   Gastrointestinal hemorrhage    Alcoholic hepatitis without ascites    Melena 05/05/2020   Alcoholic ketoacidosis 05/05/2020   Thrombocytopenia (HCC) 07/25/2019   Transaminitis 07/25/2019    Traumatic subdural hematoma (HCC) 07/02/2019   Alcohol abuse with intoxication (HCC) 07/02/2019   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    Past Surgical History:  Procedure Laterality Date   INCISION AND DRAINAGE PERIRECTAL ABSCESS N/A 09/24/2016   Procedure: IRRIGATION AND DEBRIDEMENT PERIRECTAL ABSCESS;  Surgeon: Ricarda Frame, MD;  Location: [facility] ORS;  Service: General;  Laterality: N/A;   none         Family History  Problem Relation Age of Onset   Alcohol abuse Mother    Alcohol abuse Father     Social History   Tobacco Use   Smoking status: Every Day    Packs/day: 0.50    Types: Cigarettes   Smokeless tobacco: Never   Tobacco comments:    unable to smoke while incarcerated 6+ months 02/13/20  Vaping Use   Vaping Use: Never used  Substance Use Topics   Alcohol use: Yes    Comment: "I drink all I can"   Drug use: Yes    Types: Methamphetamines, Marijuana    Comment: last used 04/10/2019    Home Medications Prior to Admission medications   Medication Sig Start Date End Date Taking? Authorizing Provider  bictegravir-emtricitabine-tenofovir AF (BIKTARVY) 50-200-25 MG TABS tablet Take 1 tablet by mouth daily after lunch. 07/31/21 08/30/21  Karsten Ro, MD  doxepin (SINEQUAN) 10 MG capsule Take 1 capsule (10 mg total) by mouth at bedtime. 07/31/21   Karsten Ro, MD  folic acid (FOLVITE) 1 MG tablet Take 1 tablet (1 mg total) by mouth daily. 08/01/21   Karsten Ro, MD  gabapentin (NEURONTIN) 300 MG capsule Take 1 capsule (300 mg total) by mouth 3 (three) times daily. 07/31/21 10/29/21  Karsten Ro, MD  Multiple Vitamin (MULTIVITAMIN WITH MINERALS) TABS tablet Take 1 tablet by mouth daily. 07/31/21   Karsten Ro, MD  pantoprazole (PROTONIX) 20 MG tablet Take 1 tablet (20 mg total) by mouth 2 (two) times daily. 07/31/21   Karsten Ro, MD  thiamine 100 MG tablet Take 1 tablet (100 mg total) by mouth daily. 08/01/21 08/31/21  Karsten Ro, MD  famotidine (PEPCID) 20 MG tablet Take 1 tablet (20 mg total) by mouth 2 (two) times daily. Patient not taking: Reported on 06/01/2019 05/14/19 06/02/19  Benjiman Core, MD    Allergies    Tegretol [carbamazepine] and Caffeine  Review of Systems   Review of Systems  Unable to perform ROS: Mental status change   Physical Exam Updated Vital Signs BP 110/65   Pulse 70   Temp 98.9 F (37.2 C) (Oral)   Resp 16   SpO2 100%   Physical Exam Vitals and nursing note reviewed.  Constitutional:      General: He is not in acute distress.    Appearance: He is not ill-appearing.     Comments: Somnolent on my exam but arousable.  HENT:     Head: Normocephalic and atraumatic.     Comments: Patient has a noted laceration on the occipital lobe, hemodynamically stable, appears to be a superficial abrasion.  No raccoon eyes or battle sign present, no other deformities of the head present    Nose: No congestion.     Mouth/Throat:     Mouth: Mucous membranes are moist.     Pharynx: Oropharynx is clear. No oropharyngeal exudate or posterior oropharyngeal erythema.     Comments: No dental trauma present, no trismus or torticollis Eyes:     Extraocular Movements: Extraocular movements intact.     Conjunctiva/sclera: Conjunctivae normal.     Pupils: Pupils are equal, round, and reactive to light.  Cardiovascular:     Rate and Rhythm: Normal rate and regular rhythm.     Pulses: Normal pulses.     Heart sounds: No murmur heard.   No friction rub. No gallop.  Pulmonary:     Effort: No respiratory distress.     Breath sounds: No wheezing, rhonchi or rales.  Chest:     Chest wall: No tenderness.  Abdominal:     Palpations: Abdomen is soft.     Tenderness:  There is no abdominal tenderness. There is no right CVA tenderness or left CVA tenderness.  Musculoskeletal:     Comments: Patient is moving all 4 extremities without difficulty.  Skin:    General: Skin is warm and dry.  Neurological:     Mental Status: He is alert.     Comments: Patient was slightly somnolent on my exam but he was easily arousable, there is no noted facial asymmetry, no difficulty with word finding, no slurring of his words, able to follow commands no unilateral weakness present.  Psychiatric:        Mood and Affect: Mood normal.    ED Results / Procedures / Treatments   Labs (all labs ordered are listed, but only  abnormal results are displayed) Labs Reviewed  CBG MONITORING, ED    EKG None  Radiology CT HEAD WO CONTRAST ( )  Result Date: 08/05/2021 CLINICAL DATA:  Alcohol withdrawal, trauma EXAM: CT HEAD WITHOUT CONTRAST TECHNIQUE: Contiguous axial images were obtained from the base of the skull through the vertex without intravenous contrast. COMPARISON:  CT head 07/24/2021 FINDINGS: Brain: There is no acute intracranial hemorrhage, extra-axial fluid collection, or acute infarct. The ventricles are stable in size. There is no mass lesion. There is no midline shift. Vascular: No hyperdense vessel or unexpected calcification. Skull: Normal. Negative for fracture or focal lesion. Sinuses/Orbits: There is mild mucosal thickening in the left frontal sinus. The other paranasal sinuses are clear. The globes and orbits are unremarkable. Other: None. IMPRESSION: Normal head CT. Electronically Signed   By: Lesia Hausen M.D.   On: 08/05/2021 17:10    Procedures Procedures   Medications Ordered in ED Medications - No data to display  ED Course  I have reviewed the triage vital signs and the nursing notes.  Pertinent labs & imaging results that were available during my care of the patient were reviewed by me and considered in my medical decision making (see chart for  details).    MDM Rules/Calculators/A&P                          Initial impression-patient presents intoxicated.  He is slightly somnolent but arousable, controlling his own airway, vital signs reassuring.  Due to toxic state with small abrasion on the back of his head will CT head for further evaluation, obtain CBG and wait for patient to sober up back to his baseline..  Work-up-CT head is negative for acute findings, CBG at 73.  Reassessment-patient is reassessed he is sleeping comfortably, has no complaints.  Vital signs remained stable.  Patient still not back to his baseline.  We will continue to monitor.  Patient was reassessed, he is more alert and oriented, he is back to his baseline, he does not endorse any pain at this time, he denies suicidal homicidal ideations, hallucinations or delusions.   Suicidal assessment tool as well CIWA was performed both throughout low risk, vital signs are reassuring.   Rule out- low suspicion for intracranial head bleed as patient denies loss of conscious, is not on anticoagulant, she does not endorse headaches, paresthesia/weakness in the upper and lower extremities, no focal deficits present on my exam.  CT head negative for acute findings.  Low suspicion for spinal cord abnormality or spinal fracture spine was palpated was nontender to palpation, patient has full range of motion in the upper and lower extremities.  Low suspicion for pneumothorax as lung sounds are clear bilaterally, chest is nontender to palpation will defer imaging at this time.   Low suspicion for intra-abdominal trauma as abdomen soft nontender to palpation.  Low suspicion for alcohol withdrawal as patient has a CIWA scoring of 1, no active tremors, vital signs are reassuring.  Low suspicion for psychiatric emergency as patient denies suicidal homicidal ideations, hallucinations or delusions, responding appropriately, does not appear to be responding to internal stimuli,  risk  stratification for suicidal is low at this time.  Psychiatric evaluation will be deferred.   Plan-  Metabolic encephalopathy since resolved-likely secondary to alcohol intoxication, patient is back to his baseline, vital signs are reassuring.  will have him follow-up with outpatient counseling.   Vital signs have remained stable, no indication for hospital admission.  Patient discussed with attending and they agreed with assessment and plan.  Patient given at home care as well strict return precautions.  Patient verbalized that they understood agreed to said plan.  Final Clinical Impression(s) / ED Diagnoses Final diagnoses:  Alcohol abuse    Rx / DC Orders ED Discharge Orders     None        Barnie Del 08/05/21 2205    Ernie Avena, MD 08/06/21 [phone]

## 2021-08-05 NOTE — ED Triage Notes (Signed)
Pt reports alcohol withdrawal and needs medical evaluation.   Denis SI or HI.   Reports last alcohol drink was today and wine.   A/O Ambulatory in triage

## 2021-08-05 NOTE — Discharge Instructions (Addendum)
Exam was reassuring.  Please continue with home medications.  Given information for outpatient counseling please follow-up.  You may also follow-up with the outpatient behavioral health units.  Come back to the emergency department if you develop chest pain, shortness of breath, severe abdominal pain, uncontrolled nausea, vomiting, diarrhea.

## 2021-08-08 ENCOUNTER — Emergency Department (HOSPITAL_COMMUNITY)
Admission: EM | Admit: 2021-08-08 | Discharge: 2021-08-09 | Disposition: A | Discharge status: Home / Self Care | Payer: Self-pay | Attending: Emergency Medicine | Admitting: Emergency Medicine

## 2021-08-08 ENCOUNTER — Encounter (HOSPITAL_COMMUNITY): Payer: Self-pay | Admitting: *Deleted

## 2021-08-08 ENCOUNTER — Other Ambulatory Visit: Payer: Self-pay

## 2021-08-08 DIAGNOSIS — F10129 Alcohol abuse with intoxication, unspecified: Secondary | ICD-10-CM | POA: Insufficient documentation

## 2021-08-08 DIAGNOSIS — R251 Tremor, unspecified: Secondary | ICD-10-CM | POA: Insufficient documentation

## 2021-08-08 DIAGNOSIS — Z21 Asymptomatic human immunodeficiency virus [HIV] infection status: Secondary | ICD-10-CM | POA: Insufficient documentation

## 2021-08-08 DIAGNOSIS — Z79899 Other long term (current) drug therapy: Secondary | ICD-10-CM | POA: Insufficient documentation

## 2021-08-08 DIAGNOSIS — R259 Unspecified abnormal involuntary movements: Secondary | ICD-10-CM

## 2021-08-08 DIAGNOSIS — F17210 Nicotine dependence, cigarettes, uncomplicated: Secondary | ICD-10-CM | POA: Insufficient documentation

## 2021-08-08 DIAGNOSIS — Y908 Blood alcohol level of 240 mg/100 ml or more: Secondary | ICD-10-CM | POA: Insufficient documentation

## 2021-08-08 LAB — URINALYSIS, ROUTINE W REFLEX MICROSCOPIC
Bilirubin Urine: NEGATIVE
Glucose, UA: NEGATIVE mg/dL
Hgb urine dipstick: NEGATIVE
Ketones, ur: 20 mg/dL — AB
Leukocytes,Ua: NEGATIVE
Nitrite: NEGATIVE
Protein, ur: NEGATIVE mg/dL
Specific Gravity, Urine: 1.009 (ref 1.005–1.030)
pH: 5 (ref 5.0–8.0)

## 2021-08-08 LAB — CBC WITH DIFFERENTIAL/PLATELET
Abs Immature Granulocytes: 0 10*3/uL (ref 0.00–0.07)
Basophils Absolute: 0.1 10*3/uL (ref 0.0–0.1)
Basophils Relative: 2 %
Eosinophils Absolute: 0.1 10*3/uL (ref 0.0–0.5)
Eosinophils Relative: 2 %
HCT: 43 % (ref 39.0–52.0)
Hemoglobin: 14.5 g/dL (ref 13.0–17.0)
Immature Granulocytes: 0 %
Lymphocytes Relative: 45 %
Lymphs Abs: 2.2 10*3/uL (ref 0.7–4.0)
MCH: 33.8 pg (ref 26.0–34.0)
MCHC: 33.7 g/dL (ref 30.0–36.0)
MCV: 100.2 fL — ABNORMAL HIGH (ref 80.0–100.0)
Monocytes Absolute: 0.3 10*3/uL (ref 0.1–1.0)
Monocytes Relative: 6 %
Neutro Abs: 2.1 10*3/uL (ref 1.7–7.7)
Neutrophils Relative %: 45 %
Platelets: 460 10*3/uL — ABNORMAL HIGH (ref 150–400)
RBC: 4.29 MIL/uL (ref 4.22–5.81)
RDW: 16.9 % — ABNORMAL HIGH (ref 11.5–15.5)
WBC: 4.7 10*3/uL (ref 4.0–10.5)
nRBC: 0 % (ref 0.0–0.2)

## 2021-08-08 LAB — COMPREHENSIVE METABOLIC PANEL
ALT: 32 U/L (ref 0–44)
AST: 46 U/L — ABNORMAL HIGH (ref 15–41)
Albumin: 4.3 g/dL (ref 3.5–5.0)
Alkaline Phosphatase: 68 U/L (ref 38–126)
Anion gap: 18 — ABNORMAL HIGH (ref 5–15)
BUN: <5 mg/dL — ABNORMAL LOW (ref 6–20)
CO2: 19 mmol/L — ABNORMAL LOW (ref 22–32)
Calcium: 9.4 mg/dL (ref 8.9–10.3)
Chloride: 101 mmol/L (ref 98–111)
Creatinine, Ser: 0.76 mg/dL (ref 0.61–1.24)
GFR, Estimated: >60 mL/min (ref >60)
Glucose, Bld: 82 mg/dL (ref 70–99)
Potassium: 3.6 mmol/L (ref 3.5–5.1)
Sodium: 138 mmol/L (ref 135–145)
Total Bilirubin: 0.7 mg/dL (ref 0.3–1.2)
Total Protein: 7.8 g/dL (ref 6.5–8.1)

## 2021-08-08 LAB — LIPASE, BLOOD: Lipase: 53 U/L — ABNORMAL HIGH (ref 11–51)

## 2021-08-08 LAB — ETHANOL: Alcohol, Ethyl (B): 281 mg/dL — ABNORMAL HIGH (ref <10)

## 2021-08-08 NOTE — ED Provider Notes (Signed)
Emergency Medicine Provider Triage Evaluation Note  Gary [name] , a 35 y.o. male  was evaluated in triage.  Pt complains of etoh withdrawal sxs. Last etoh was earlier today. C/o tremors.  Review of Systems  Positive: tremors Negative: fever  Physical Exam  BP 110/86 (BP Location: Right Arm)   Pulse (!) 109   Temp 98.8 F (37.1 C) (Oral)   Resp 17   Ht 6' (1.829 m)   Wt 86.2 kg   SpO2 100%   BMI 25.77 kg/m  Gen:   Awake, no distress   Resp:  Normal effort  MSK:   Moves extremities without difficulty  Other:  Chronically ill appearing  Medical Decision Making  Medically screening exam initiated at 7:53 PM.  Appropriate orders placed.  Gary [name] was informed that the remainder of the evaluation will be completed by another provider, this initial triage assessment does not replace that evaluation, and the importance of remaining in the ED until their evaluation is complete.     Karrie Meres, PA-C 08/08/21 1953    Virgina Norfolk, DO 08/08/21 2034

## 2021-08-08 NOTE — ED Triage Notes (Signed)
The pt arrived by [address] from the [address] he is homeless and he wants to be detoxed from [facility]hol  he had alcohol earlier today

## 2021-08-09 MED ORDER — CHLORDIAZEPOXIDE HCL 25 MG PO CAPS
100.0000 mg | ORAL_CAPSULE | Freq: Once | ORAL | Status: AC
  Administered 2021-08-09: 100 mg via ORAL
  Filled 2021-08-09: qty 4

## 2021-08-09 MED ORDER — BENZTROPINE MESYLATE 1 MG/ML IJ SOLN
1.0000 mg | Freq: Once | INTRAMUSCULAR | Status: AC
  Administered 2021-08-09: 1 mg via INTRAMUSCULAR
  Filled 2021-08-09: qty 1

## 2021-08-09 MED ORDER — BENZTROPINE MESYLATE 1 MG PO TABS
1.0000 mg | ORAL_TABLET | Freq: Two times a day (BID) | ORAL | 0 refills | Status: DC
  Filled 2021-08-09: qty 60, 30d supply, fill #0

## 2021-08-09 MED ORDER — ONDANSETRON 4 MG PO TBDP
4.0000 mg | ORAL_TABLET | Freq: Once | ORAL | Status: AC
  Administered 2021-08-09: 4 mg via ORAL
  Filled 2021-08-09: qty 1

## 2021-08-09 NOTE — ED Notes (Signed)
Pt A&OX4, ambulatory at d/c. Was offered a taxi voucher but stated he did not know the address of somewhere to go. He states he will figure out a way to get home.

## 2021-08-09 NOTE — ED Provider Notes (Addendum)
[facility] EMERGENCY DEPARTMENT Provider Note   CSN: [id number] Arrival date & time: 08/08/21  1940     History Chief Complaint  Patient presents with   detox    Gary [name] is a 35 y.o. male.  35 yo M with a chief complaints of tremors.'s been going on since this morning.  Patient states that he has had problems trying to quit drinking before.  He was drinking this morning.  States it made no real difference in the tremors.  Denies any medications for psychosis or mental illness or depression.  He denies any illegal drugs.  The history is provided by the patient.  Illness Severity:  Moderate Onset quality:  Gradual Duration:  1 day Timing:  Constant Progression:  Worsening Chronicity:  New Associated symptoms: no abdominal pain, no chest pain, no congestion, no diarrhea, no fever, no headaches, no myalgias, no rash, no shortness of breath and no vomiting       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   Elevated troponin 10/19/2020   Alcohol withdrawal (HCC) 10/19/2020   Alcohol abuse    Suicidal ideation 06/14/2020   Pancreatitis 06/13/2020   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   Malnutrition of moderate degree 05/07/2020   Gastrointestinal hemorrhage    Alcoholic hepatitis without ascites    Melena 05/05/2020   Alcoholic ketoacidosis 05/05/2020   Thrombocytopenia (HCC) 07/25/2019   Transaminitis 07/25/2019   Traumatic subdural hematoma 07/02/2019   Alcohol abuse with intoxication (HCC) 07/02/2019    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    Past Surgical History:  Procedure Laterality Date   INCISION AND DRAINAGE PERIRECTAL ABSCESS N/A 09/24/2016   Procedure: IRRIGATION AND DEBRIDEMENT PERIRECTAL ABSCESS;  Surgeon: Ricarda Frame, MD;  Location: [facility] ORS;  Service: General;  Laterality: N/A;   none         Family History  Problem Relation Age of Onset   Alcohol abuse Mother    Alcohol abuse Father     Social History   Tobacco Use   Smoking status: Every Day    Packs/day: 0.50    Types: Cigarettes   Smokeless tobacco: Never   Tobacco comments:    unable to smoke while incarcerated 6+ months 02/13/20  Vaping Use   Vaping Use: Never used  Substance Use Topics   Alcohol use: Yes    Comment: "I drink all I can"   Drug use: Yes    Types: Methamphetamines, Marijuana    Comment: last used 04/10/2019    Home Medications Prior to Admission medications   Medication Sig Start Date End Date Taking? Authorizing Provider  benztropine (COGENTIN) 1 MG tablet Take 1 tablet (1 mg total) by mouth 2 (two) times daily. 08/09/21 09/08/21 Yes Melene Plan, DO  bictegravir-emtricitabine-tenofovir AF (BIKTARVY) 50-200-25 MG TABS tablet Take 1  tablet by mouth daily after lunch. 07/31/21 08/30/21  Karsten Ro, MD  doxepin (SINEQUAN) 10 MG capsule Take 1 capsule (10 mg total) by mouth at bedtime. 07/31/21   Karsten Ro, MD  folic acid (FOLVITE) 1 MG tablet Take 1 tablet (1 mg total) by mouth daily. 08/01/21   Karsten Ro, MD  gabapentin (NEURONTIN) 300 MG capsule Take 1 capsule (300 mg total) by mouth 3 (three) times daily. 07/31/21 10/29/21  Karsten Ro, MD  Multiple Vitamin (MULTIVITAMIN WITH MINERALS) TABS tablet Take 1 tablet by  mouth daily. 07/31/21   Karsten Ro, MD  pantoprazole (PROTONIX) 20 MG tablet Take 1 tablet (20 mg total) by mouth 2 (two) times daily. 07/31/21   Karsten Ro, MD  thiamine 100 MG tablet Take 1 tablet (100 mg total) by mouth daily. 08/01/21 08/31/21  Karsten Ro, MD  famotidine (PEPCID) 20 MG tablet Take 1 tablet (20 mg total) by mouth 2 (two) times daily. Patient not taking: Reported on 06/01/2019 05/14/19 06/02/19  Benjiman Core, MD    Allergies    Tegretol [carbamazepine] and Caffeine  Review of Systems   Review of Systems  Constitutional:  Negative for chills and fever.  HENT:  Negative for congestion and facial swelling.   Eyes:  Negative for discharge and visual disturbance.  Respiratory:  Negative for shortness of breath.   Cardiovascular:  Negative for chest pain and palpitations.  Gastrointestinal:  Negative for abdominal pain, diarrhea and vomiting.  Musculoskeletal:  Negative for arthralgias and myalgias.  Skin:  Negative for color change and rash.  Neurological:  Positive for tremors. Negative for syncope and headaches.  Psychiatric/Behavioral:  Negative for confusion and dysphoric mood.    Physical Exam Updated Vital Signs BP 115/80   Pulse 93   Temp 98.8 F (37.1 C) (Oral)   Resp 16   Ht 6' (1.829 m)   Wt 86.2 kg   SpO2 96%   BMI 25.77 kg/m   Physical Exam Vitals and nursing note reviewed.  Constitutional:      Appearance: He is well-developed.  HENT:     Head: Normocephalic and atraumatic.  Eyes:     Pupils: Pupils are equal, round, and reactive to light.  Neck:     Vascular: No JVD.  Cardiovascular:     Rate and Rhythm: Normal rate and regular rhythm.     Heart sounds: No murmur heard.   No friction rub. No gallop.  Pulmonary:     Effort: No respiratory distress.     Breath sounds: No wheezing.  Abdominal:     General: There is no distension.     Tenderness: There is no abdominal tenderness. There is no guarding or rebound.  Musculoskeletal:         General: Normal range of motion.     Cervical back: Normal range of motion and neck supple.  Skin:    Coloration: Skin is not pale.     Findings: No rash.  Neurological:     Mental Status: He is alert and oriented to person, place, and time.     Comments: Coarse tremors.  Seemed distractible.  Some intermittent cogwheeling with bilateral upper extremities.  Psychiatric:        Behavior: Behavior normal.    ED Results / Procedures / Treatments   Labs (all labs ordered are listed, but only abnormal results are displayed) Labs Reviewed  CBC WITH DIFFERENTIAL/PLATELET - Abnormal; Notable for the following components:      Result Value  MCV 100.2 (*)    RDW 16.9 (*)    Platelets 460 (*)    All other components within normal limits  COMPREHENSIVE METABOLIC PANEL - Abnormal; Notable for the following components:   CO2 19 (*)    BUN <5 (*)    AST 46 (*)    Anion gap 18 (*)    All other components within normal limits  ETHANOL - Abnormal; Notable for the following components:   Alcohol, Ethyl (B) 281 (*)    All other components within normal limits  LIPASE, BLOOD - Abnormal; Notable for the following components:   Lipase 53 (*)    All other components within normal limits  URINALYSIS, ROUTINE W REFLEX MICROSCOPIC - Abnormal; Notable for the following components:   Ketones, ur 20 (*)    All other components within normal limits    EKG None  Radiology No results found.  Procedures Procedures   Medications Ordered in ED Medications  benztropine mesylate (COGENTIN) injection 1 mg (1 mg Intramuscular Given 08/09/21 0249)  chlordiazePOXIDE (LIBRIUM) capsule 100 mg (100 mg Oral Given 08/09/21 0246)  ondansetron (ZOFRAN-ODT) disintegrating tablet 4 mg (4 mg Oral Given 08/09/21 0316)    ED Course  I have reviewed the triage vital signs and the nursing notes.  Pertinent labs & imaging results that were available during my care of the patient were reviewed by me and  considered in my medical decision making (see chart for details).    MDM Rules/Calculators/A&P                           35 yo M with a chief complaints of tremors.  This has been going on since this morning.  Thought to be secondary to alcohol withdrawal though alcohol level appears to be elevated upon arrival.  Tremor does not seem to be consistent with alcohol withdrawal for me much more coarse and seems somewhat distractible.  His heart rate and blood pressure are also normal which makes this much less likely.  He is not on a beta-blocker to try and block that effect.  He did have some cogwheeling which could be more him resisting me during the exam, also no history of serotonergic medications or antipsychotic meds.  We will give a single dose of benztropine in case this may help the patient.  A dose of Librium.  Reassess.  The patient has had some improvement with Cogentin.  We will start him on this orally.  Have him follow-up with a neurologist in the office.  3:38 AM:  I have discussed the diagnosis/risks/treatment options with the patient and believe the pt to be eligible for discharge home to follow-up with PCP, neuro. We also discussed returning to the ED immediately if new or worsening sx occur. We discussed the sx which are most concerning (e.g., sudden worsening pain, fever, inability to tolerate by mouth) that necessitate immediate return. Medications administered to the patient during their visit and any new prescriptions provided to the patient are listed below.  Medications given during this visit Medications  benztropine mesylate (COGENTIN) injection 1 mg (1 mg Intramuscular Given 08/09/21 0249)  chlordiazePOXIDE (LIBRIUM) capsule 100 mg (100 mg Oral Given 08/09/21 0246)  ondansetron (ZOFRAN-ODT) disintegrating tablet 4 mg (4 mg Oral Given 08/09/21 0316)     The patient appears reasonably screen and/or stabilized for discharge and I doubt any other medical condition or other  [facility] requiring further screening, evaluation, or treatment in  the ED at this time prior to discharge.   Final Clinical Impression(s) / ED Diagnoses Final diagnoses:  Parkinsonian features    Rx / DC Orders ED Discharge Orders          Ordered    benztropine (COGENTIN) 1 MG tablet  2 times daily        08/09/21 0330    Ambulatory referral to Neurology        08/09/21 0330             Melene Plan, DO 08/09/21 0712    Melene Plan, DO 08/09/21 [phone]

## 2021-08-09 NOTE — Discharge Instructions (Addendum)
He may have a parkinsonian-like syndrome most, though this happens with certain medications.  I prescribed you a medicine that might help you with his at home.  I think you should follow-up with a neurologist so that they can try and evaluate you.

## 2021-08-11 ENCOUNTER — Other Ambulatory Visit (HOSPITAL_COMMUNITY): Payer: Self-pay

## 2021-08-25 ENCOUNTER — Encounter (HOSPITAL_COMMUNITY): Payer: Self-pay

## 2021-08-25 ENCOUNTER — Other Ambulatory Visit: Payer: Self-pay

## 2021-08-25 ENCOUNTER — Emergency Department (HOSPITAL_COMMUNITY)
Admission: EM | Admit: 2021-08-25 | Discharge: 2021-08-26 | Disposition: A | Discharge status: Other Acute Inpatient Hospital | Payer: Self-pay | Attending: Emergency Medicine | Admitting: Emergency Medicine

## 2021-08-25 DIAGNOSIS — R45851 Suicidal ideations: Secondary | ICD-10-CM | POA: Insufficient documentation

## 2021-08-25 DIAGNOSIS — Z20822 Contact with and (suspected) exposure to covid-19: Secondary | ICD-10-CM | POA: Insufficient documentation

## 2021-08-25 DIAGNOSIS — F1010 Alcohol abuse, uncomplicated: Secondary | ICD-10-CM

## 2021-08-25 DIAGNOSIS — Z21 Asymptomatic human immunodeficiency virus [HIV] infection status: Secondary | ICD-10-CM | POA: Insufficient documentation

## 2021-08-25 DIAGNOSIS — Y908 Blood alcohol level of 240 mg/100 ml or more: Secondary | ICD-10-CM | POA: Insufficient documentation

## 2021-08-25 DIAGNOSIS — F1020 Alcohol dependence, uncomplicated: Secondary | ICD-10-CM | POA: Insufficient documentation

## 2021-08-25 DIAGNOSIS — F17210 Nicotine dependence, cigarettes, uncomplicated: Secondary | ICD-10-CM | POA: Insufficient documentation

## 2021-08-25 DIAGNOSIS — F332 Major depressive disorder, recurrent severe without psychotic features: Secondary | ICD-10-CM | POA: Insufficient documentation

## 2021-08-25 MED ORDER — LORAZEPAM 1 MG PO TABS
0.0000 mg | ORAL_TABLET | Freq: Four times a day (QID) | ORAL | Status: DC
  Administered 2021-08-26 (×3): 2 mg via ORAL
  Filled 2021-08-25 (×3): qty 2

## 2021-08-25 MED ORDER — THIAMINE HCL 100 MG PO TABS
100.0000 mg | ORAL_TABLET | Freq: Every day | ORAL | Status: DC
  Administered 2021-08-26: 100 mg via ORAL
  Filled 2021-08-25: qty 1

## 2021-08-25 MED ORDER — THIAMINE HCL 100 MG/ML IJ SOLN: 100.0000 mg | Freq: Every day | INTRAMUSCULAR | Status: DC

## 2021-08-25 MED ORDER — LORAZEPAM 2 MG/ML IJ SOLN
0.0000 mg | Freq: Four times a day (QID) | INTRAMUSCULAR | Status: DC
Start: 2021-08-25 — End: 2021-08-26

## 2021-08-25 MED ORDER — LORAZEPAM 2 MG/ML IJ SOLN: 0.0000 mg | Freq: Two times a day (BID) | INTRAMUSCULAR | Status: DC

## 2021-08-25 MED ORDER — LORAZEPAM 1 MG PO TABS: 0.0000 mg | ORAL_TABLET | Freq: Two times a day (BID) | ORAL | Status: DC

## 2021-08-25 NOTE — ED Triage Notes (Signed)
Patient arrived with police. Having a mental breakdown, said he is suicidal. His plan to get the sharpest thing he has and take himself out. Patient drinks 6-7 bottles of liquor every day. Last an hour ago.

## 2021-08-25 NOTE — ED Provider Notes (Signed)
Emergency Medicine Provider Triage Evaluation Note  Gary [name] , a 35 y.o. male  was evaluated in triage.  Pt brought in by police after a suicide attempt.  Patient was located trying to jump into traffic, and punching traffic signs.  A friend of his called the police.  Patient tells me now that he is suicidal and his plan is to stab himself with a sharp object he can find.  Also reporting that he may go through alcohol withdrawals eventually because he drinks 6-7 bottles of wine.  Last drink today.  Review of Systems  Positive: SI Negative:   Physical Exam  BP 111/82   Pulse (!) 107   Temp 98.2 F (36.8 C)   Resp (!) 22   Ht 6' (1.829 m)   Wt 86 kg   SpO2 97%   BMI 25.71 kg/m  Gen:   Awake, no distress   Resp:  Normal effort  MSK:   Moves extremities without difficulty  Other:    Medical Decision Making  Medically screening exam initiated at 10:00 PM.  Appropriate orders placed.  Gary [name] was informed that the remainder of the evaluation will be completed by another provider, this initial triage assessment does not replace that evaluation, and the importance of remaining in the ED until their evaluation is complete.  IVC filled out.   Woodroe Chen 08/25/21 2202    Pricilla Loveless, MD 08/27/21 [phone]

## 2021-08-26 ENCOUNTER — Other Ambulatory Visit (HOSPITAL_COMMUNITY)
Admission: EM | Admit: 2021-08-26 | Discharge: 2021-08-30 | Disposition: A | Discharge status: Home / Self Care | Payer: No Payment, Other | Attending: Psychiatry | Admitting: Psychiatry

## 2021-08-26 DIAGNOSIS — F1994 Other psychoactive substance use, unspecified with psychoactive substance-induced mood disorder: Secondary | ICD-10-CM | POA: Insufficient documentation

## 2021-08-26 DIAGNOSIS — R45851 Suicidal ideations: Secondary | ICD-10-CM | POA: Diagnosis not present

## 2021-08-26 DIAGNOSIS — F10930 Alcohol use, unspecified with withdrawal, uncomplicated: Secondary | ICD-10-CM

## 2021-08-26 DIAGNOSIS — Z21 Asymptomatic human immunodeficiency virus [HIV] infection status: Secondary | ICD-10-CM | POA: Insufficient documentation

## 2021-08-26 DIAGNOSIS — F10230 Alcohol dependence with withdrawal, uncomplicated: Secondary | ICD-10-CM | POA: Insufficient documentation

## 2021-08-26 DIAGNOSIS — Z560 Unemployment, unspecified: Secondary | ICD-10-CM | POA: Insufficient documentation

## 2021-08-26 DIAGNOSIS — Z79899 Other long term (current) drug therapy: Secondary | ICD-10-CM | POA: Insufficient documentation

## 2021-08-26 DIAGNOSIS — F17210 Nicotine dependence, cigarettes, uncomplicated: Secondary | ICD-10-CM | POA: Insufficient documentation

## 2021-08-26 DIAGNOSIS — F1020 Alcohol dependence, uncomplicated: Secondary | ICD-10-CM | POA: Diagnosis present

## 2021-08-26 LAB — CBC WITH DIFFERENTIAL/PLATELET
Abs Immature Granulocytes: 0 10*3/uL (ref 0.00–0.07)
Basophils Absolute: 0 10*3/uL (ref 0.0–0.1)
Basophils Relative: 1 %
Eosinophils Absolute: 0.1 10*3/uL (ref 0.0–0.5)
Eosinophils Relative: 3 %
HCT: 44 % (ref 39.0–52.0)
Hemoglobin: 15.1 g/dL (ref 13.0–17.0)
Immature Granulocytes: 0 %
Lymphocytes Relative: 50 %
Lymphs Abs: 1.9 10*3/uL (ref 0.7–4.0)
MCH: 33.9 pg (ref 26.0–34.0)
MCHC: 34.3 g/dL (ref 30.0–36.0)
MCV: 98.9 fL (ref 80.0–100.0)
Monocytes Absolute: 0.4 10*3/uL (ref 0.1–1.0)
Monocytes Relative: 11 %
Neutro Abs: 1.4 10*3/uL — ABNORMAL LOW (ref 1.7–7.7)
Neutrophils Relative %: 35 %
Platelets: 143 10*3/uL — ABNORMAL LOW (ref 150–400)
RBC: 4.45 MIL/uL (ref 4.22–5.81)
RDW: 16.2 % — ABNORMAL HIGH (ref 11.5–15.5)
WBC: 3.9 10*3/uL — ABNORMAL LOW (ref 4.0–10.5)
nRBC: 0 % (ref 0.0–0.2)

## 2021-08-26 LAB — RAPID URINE DRUG SCREEN, HOSP PERFORMED
Amphetamines: NOT DETECTED
Barbiturates: NOT DETECTED
Benzodiazepines: POSITIVE — AB
Cocaine: NOT DETECTED
Opiates: NOT DETECTED
Tetrahydrocannabinol: NOT DETECTED

## 2021-08-26 LAB — COMPREHENSIVE METABOLIC PANEL
ALT: 35 U/L (ref 0–44)
AST: 56 U/L — ABNORMAL HIGH (ref 15–41)
Albumin: 4.8 g/dL (ref 3.5–5.0)
Alkaline Phosphatase: 56 U/L (ref 38–126)
Anion gap: 11 (ref 5–15)
BUN: 6 mg/dL (ref 6–20)
CO2: 26 mmol/L (ref 22–32)
Calcium: 9.3 mg/dL (ref 8.9–10.3)
Chloride: 101 mmol/L (ref 98–111)
Creatinine, Ser: 0.65 mg/dL (ref 0.61–1.24)
GFR, Estimated: >60 mL/min (ref >60)
Glucose, Bld: 99 mg/dL (ref 70–99)
Potassium: 3.7 mmol/L (ref 3.5–5.1)
Sodium: 138 mmol/L (ref 135–145)
Total Bilirubin: 0.5 mg/dL (ref 0.3–1.2)
Total Protein: 8 g/dL (ref 6.5–8.1)

## 2021-08-26 LAB — ETHANOL: Alcohol, Ethyl (B): 333 mg/dL (ref <10)

## 2021-08-26 LAB — RESP PANEL BY RT-PCR (FLU A&B, COVID) ARPGX2
Influenza A by PCR: NEGATIVE
Influenza B by PCR: NEGATIVE
SARS Coronavirus 2 by RT PCR: NEGATIVE

## 2021-08-26 LAB — ACETAMINOPHEN LEVEL: Acetaminophen (Tylenol), Serum: 1 ug/mL — ABNORMAL LOW (ref 10–30)

## 2021-08-26 LAB — SALICYLATE LEVEL: Salicylate Lvl: <7 mg/dL — ABNORMAL LOW (ref 7.0–30.0)

## 2021-08-26 MED ORDER — MAGNESIUM HYDROXIDE 400 MG/5ML PO SUSP
30.0000 mL | Freq: Every day | ORAL | Status: DC | PRN
  Administered 2021-08-28 – 2021-08-29 (×2): 30 mL via ORAL
  Filled 2021-08-26 (×2): qty 30

## 2021-08-26 MED ORDER — PROMETHAZINE HCL 25 MG PO TABS
25.0000 mg | ORAL_TABLET | Freq: Three times a day (TID) | ORAL | Status: DC | PRN
  Administered 2021-08-26: 25 mg via ORAL
  Filled 2021-08-26: qty 1

## 2021-08-26 MED ORDER — HYDROXYZINE HCL 25 MG PO TABS
25.0000 mg | ORAL_TABLET | Freq: Four times a day (QID) | ORAL | Status: AC | PRN
  Administered 2021-08-28 – 2021-08-29 (×2): 25 mg via ORAL
  Filled 2021-08-26: qty 1

## 2021-08-26 MED ORDER — ACETAMINOPHEN 325 MG PO TABS
650.0000 mg | ORAL_TABLET | Freq: Four times a day (QID) | ORAL | Status: DC | PRN
  Administered 2021-08-28: 650 mg via ORAL
  Filled 2021-08-26: qty 2

## 2021-08-26 MED ORDER — GABAPENTIN 300 MG PO CAPS
300.0000 mg | ORAL_CAPSULE | Freq: Three times a day (TID) | ORAL | Status: DC
  Administered 2021-08-26 – 2021-08-30 (×12): 300 mg via ORAL
  Filled 2021-08-26 (×12): qty 1

## 2021-08-26 MED ORDER — THIAMINE HCL 100 MG PO TABS
100.0000 mg | ORAL_TABLET | Freq: Every day | ORAL | Status: DC
  Administered 2021-08-27 – 2021-08-30 (×4): 100 mg via ORAL
  Filled 2021-08-26 (×4): qty 1

## 2021-08-26 MED ORDER — PROMETHAZINE HCL 25 MG PO TABS
25.0000 mg | ORAL_TABLET | Freq: Four times a day (QID) | ORAL | Status: DC | PRN
  Administered 2021-08-28: 25 mg via ORAL
  Filled 2021-08-26: qty 1

## 2021-08-26 MED ORDER — LORAZEPAM 1 MG PO TABS
1.0000 mg | ORAL_TABLET | Freq: Three times a day (TID) | ORAL | Status: AC
  Administered 2021-08-28 (×3): 1 mg via ORAL
  Filled 2021-08-26 (×3): qty 1

## 2021-08-26 MED ORDER — ONDANSETRON 4 MG PO TBDP: 4.0000 mg | ORAL_TABLET | Freq: Four times a day (QID) | ORAL | Status: DC | PRN

## 2021-08-26 MED ORDER — THIAMINE HCL 100 MG/ML IJ SOLN
100.0000 mg | Freq: Once | INTRAMUSCULAR | Status: AC
  Administered 2021-08-26: 100 mg via INTRAMUSCULAR
  Filled 2021-08-26: qty 2

## 2021-08-26 MED ORDER — PANTOPRAZOLE SODIUM 40 MG PO TBEC: 40.0000 mg | DELAYED_RELEASE_TABLET | Freq: Every day | ORAL | Status: DC

## 2021-08-26 MED ORDER — LORAZEPAM 1 MG PO TABS
1.0000 mg | ORAL_TABLET | Freq: Four times a day (QID) | ORAL | Status: AC
  Administered 2021-08-26 – 2021-08-27 (×6): 1 mg via ORAL
  Filled 2021-08-26 (×6): qty 1

## 2021-08-26 MED ORDER — ACETAMINOPHEN 325 MG PO TABS
650.0000 mg | ORAL_TABLET | Freq: Once | ORAL | Status: AC
  Administered 2021-08-26: 650 mg via ORAL
  Filled 2021-08-26: qty 2

## 2021-08-26 MED ORDER — ALUM & MAG HYDROXIDE-SIMETH 200-200-20 MG/5ML PO SUSP: 30.0000 mL | ORAL | Status: DC | PRN

## 2021-08-26 MED ORDER — LORAZEPAM 1 MG PO TABS
1.0000 mg | ORAL_TABLET | Freq: Every day | ORAL | Status: AC
  Administered 2021-08-30: 1 mg via ORAL
  Filled 2021-08-26: qty 1

## 2021-08-26 MED ORDER — PANTOPRAZOLE SODIUM 20 MG PO TBEC
20.0000 mg | DELAYED_RELEASE_TABLET | Freq: Two times a day (BID) | ORAL | Status: DC
  Administered 2021-08-26 – 2021-08-30 (×8): 20 mg via ORAL
  Filled 2021-08-26 (×8): qty 1

## 2021-08-26 MED ORDER — BICTEGRAVIR-EMTRICITAB-TENOFOV 50-200-25 MG PO TABS
1.0000 | ORAL_TABLET | Freq: Every day | ORAL | Status: DC
  Administered 2021-08-26 – 2021-08-30 (×5): 1 via ORAL
  Filled 2021-08-26 (×2): qty 1
  Filled 2021-08-26: qty 3
  Filled 2021-08-26 (×3): qty 1

## 2021-08-26 MED ORDER — LORAZEPAM 1 MG PO TABS
1.0000 mg | ORAL_TABLET | Freq: Two times a day (BID) | ORAL | Status: AC
  Administered 2021-08-29 (×2): 1 mg via ORAL
  Filled 2021-08-26 (×2): qty 1

## 2021-08-26 MED ORDER — LOPERAMIDE HCL 2 MG PO CAPS: 2.0000 mg | ORAL_CAPSULE | ORAL | Status: AC | PRN

## 2021-08-26 MED ORDER — ADULT MULTIVITAMIN W/MINERALS CH
1.0000 | ORAL_TABLET | Freq: Every day | ORAL | Status: DC
  Administered 2021-08-26 – 2021-08-30 (×5): 1 via ORAL
  Filled 2021-08-26 (×5): qty 1

## 2021-08-26 MED ORDER — LORAZEPAM 1 MG PO TABS: 1.0000 mg | ORAL_TABLET | Freq: Four times a day (QID) | ORAL | Status: AC | PRN

## 2021-08-26 MED ORDER — HYDROXYZINE HCL 25 MG PO TABS
25.0000 mg | ORAL_TABLET | Freq: Three times a day (TID) | ORAL | Status: DC | PRN
  Administered 2021-08-27 – 2021-08-30 (×2): 25 mg via ORAL
  Filled 2021-08-26 (×5): qty 1

## 2021-08-26 MED ORDER — TRAZODONE HCL 50 MG PO TABS
50.0000 mg | ORAL_TABLET | Freq: Every evening | ORAL | Status: DC | PRN
  Administered 2021-08-26 – 2021-08-27 (×2): 50 mg via ORAL
  Filled 2021-08-26 (×2): qty 1

## 2021-08-26 NOTE — ED Notes (Signed)
Patient changed out into burgundy scrubs. 1 patient belongings bag at the triage nurse station.

## 2021-08-26 NOTE — ED Provider Notes (Signed)
[facility] Admission Suicide Risk Assessment   Nursing information obtained from:   chart  Current Mental Status:   denies SI  Demographic Factors:  Male, Caucasian, Low socioeconomic status, and Living alone  Loss Factors: Financial problems/change in socioeconomic status  Historical Factors: Prior suicide attempts, Family history of mental illness or substance abuse, and Impulsivity  Risk Reduction Factors:   Low barrier to seek help   Total Time spent with patient: 30 minutes Principal Problem: <principal problem not specified> Diagnosis:  Active Problems:   Alcohol use disorder, severe, dependence (HCC)   Substance induced mood disorder (HCC)  Subjective Data:  35 year old male with history of substance use, depression, HIV, who presented to [facility] ED on 10/17 for SI, patient was found attempting to jump into traffic and punching traffic signs.  Patient reported he was suicidal with a plan to stab himself at that time.  Patient reported drinking 6-7 bottles of wine a day last drink being prior to presentation.  It was assessed by TTS.  Patient had reported that for the past 2 weeks he was drinking 6 fifths of vodka daily for the past 2 days he been drinking 6 bottles of wine.  Patient was initially, however patient has been calm.  Patient was accepted to [facility] for further treatment on 10/18. Etoh 333 and UDS+BZD + on presentation.   Per chart review patient was hospitalized at [facility] from 07/26/2021-07/29/21 for SI and alcohol use.  Patient was discharged with Biktarvy, doxepin 10 mg nightly, folic acid 1 mg daily, gabapentin 300 mg 3 times daily for alcohol use disorder, multivitamin, pantoprazole 40 mg daily, thiamine 100 mg.  Patient has had several presentations related to alcohol use.   Patient interviewed in office this afternoon.  Patient is calm, cooperative, and pleasant.  Patient states that he is "not 100% sure" about how he ended up at the hospital.  Patient states  he believes he told a friend that he wanted assistance with drinking who then brought him to the hospital.  Patient reports drinking 6-7 bottles of wine daily for approximately the last month.  Patient states that [facility] [place] up until about a month ago when he resumed drinking.  Patient also reports that he also consumes. 3.  Patient states his last drink was about was over 24 hours ago.  He reports current withdrawal symptoms of nausea, weakness, tremors.  Patient recalls recent admission to [facility] behavioral health.  He states that upon discharge he was not provided any scripts for medications and therefore has not been taking medications as prescribed.  Patient is a poor historian and is unable to recall if he [facility] before or after his admission at [facility].  Patient denies SI/HI/AVH.  Patient denies paranoia.  Discussed with patient what services he would like upon discharge-patient indicates he is unsure if he would like to attend rehab as he would like to get back to work since he is able.  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  Patient states that he would be interested in housing resources upon discharge as he is currently homeless.      Obtained from chart review and patient Past Psychiatric History: Previous Medication Trials: doxepin, gabapentin Previous Psychiatric Hospitalizations: yes, several; most recently at [facility] 07/2021 as above Previous Suicide Attempts: yes  Outpatient psychiatrist: no   Social History: Marital Status: not married Children: 0 Source of Income:  landscaping Education:  HS graduate Special Ed: no Housing Status: homeless History of phys/sexual abuse: no Easy access to gun: no   Substance Use (with emphasis over the last 12 months) Recreational Drugs: alcohol Use of Alcohol: heavy Tobacco Use: yes " a couple cigarettes" Rehab History: yes, jan 2022 [facility] for 14  days, has not been to any rehab since that time H/O Complicated Withdrawal: yes   Legal History: Past Charges/Incarcerations: denies Pending charges: denies   Family Psychiatric History: Alcohol abuse runs in family        Continued Clinical Symptoms:    The "Alcohol Use Disorders Identification Test", Guidelines for Use in Primary Care, Second Edition.  World Science writer [facility]). Score between 0-7:  no or low risk or alcohol related problems. Score between 8-15:  moderate risk of alcohol related problems. Score between 16-19:  high risk of alcohol related problems. Score 20 or above:  warrants further diagnostic evaluation for alcohol dependence and treatment.   CLINICAL FACTORS:   Depression:   Comorbid alcohol abuse/dependence Alcohol/Substance Abuse/Dependencies Previous Psychiatric Diagnoses and Treatments Medical Diagnoses and Treatments/Surgeries   Musculoskeletal: Strength & Muscle Tone: within normal limits Gait & Station: broad based, slow Patient leans: N/A  Psychiatric Specialty Exam:  Presentation  General Appearance: Appropriate for Environment; Casual  Eye Contact:Fair  Speech:Clear and Coherent; Normal Rate  Speech Volume:Normal  Handedness:Right   Mood and Affect  Mood:Dysphoric  Affect:Congruent   Thought Process  Thought Processes:Coherent; Goal Directed; Linear  Descriptions of Associations:Intact  Orientation:Full (Time, Place and Person)  Thought Content:WDL; Logical  History of Schizophrenia/Schizoaffective disorder:No  Duration of Psychotic Symptoms:Less than six months  Hallucinations:Hallucinations: None  Ideas of Reference:None  Suicidal Thoughts:Suicidal Thoughts: No  Homicidal Thoughts:Homicidal Thoughts: No   Sensorium  Memory:Immediate Fair; Recent Poor; Remote Poor  Judgment:Intact  Insight:Present   Executive Functions  Concentration:Fair  Attention Span:Fair  Recall:Poor  Fund of  Knowledge:Fair  Language:Fair   Psychomotor Activity  Psychomotor Activity:Psychomotor Activity: Normal; Tremor (tremor in BUE with outstretched arms)   Assets  Assets:Communication Skills; Desire for Improvement; Resilience   Sleep  Sleep:Sleep: Fair    Physical Exam: See H&P for ROS and physical exam  Blood pressure 127/77, pulse 79, temperature 98.9 F (37.2 C), temperature source Oral, resp. rate 18, SpO2 99 %. There is no height or weight on file to calculate BMI.   COGNITIVE FEATURES THAT CONTRIBUTE TO RISK:  Thought constriction (tunnel vision)    SUICIDE RISK:   Minimal: No identifiable suicidal ideation.  Patients presenting with no risk factors but with morbid ruminations; may be classified as minimal risk based on the severity of the depressive symptoms  PLAN OF CARE:    35 year old male with history of substance use, depression, HIV, who presented to [facility] ED on 10/17 for SI, patient was found attempting to jump into traffic and punching traffic signs.  Patient reported he was suicidal with a plan to stab himself at that time.  Patient reported drinking 6-7 bottles of wine a day last drink being prior to presentation.  It was assessed by TTS.  Patient had reported that for the past 2 weeks he was drinking 6 fifths of vodka daily for the past 2 days he been drinking 6 bottles of wine.  Patient was initially, however patient has been calm.  Patient was medically cleared in the ED and was accepted to [facility] for further treatment on 10/18. Etoh 333 and UDS+BZD + on presentation. Patient denies SI/HI/AVH today and  requests assistance with detox. Patient is appropriate for [facility] at t his time-patient agreeable to ativan taper.    Alcohol use disorder , severe SIMD -initiate ativan taper  -detox protocol -restart gabapentin 300 mg TID -vitamin and thiamine supplementation   HIV -restart biktarvy   Dispo: Ongoing.  Patient possibly interested in rehab.  Patient  requesting assistance with housing.  Will consult with social work for assistance.  I certify that inpatient services furnished can reasonably be expected to improve the patient's condition.   Estella Husk, MD 08/26/2021, 5:23 PM

## 2021-08-26 NOTE — BH Assessment (Signed)
Comprehensive Clinical Assessment (CCA) Note  08/26/2021 Gary [name] [id number]  Disposition: Melbourne Abts, PA-C recommends inpatient treatment. No available beds at [facility] Disposition CSW to seek placement. Disposition discussed with Cathie Olden, RN via secure chat in [place]. Clinician asked RN to inform EDP of pt's disposition.   Flowsheet Row ED from 08/25/2021 in [facility] from 08/08/2021 in [facility]GENCY DEPART[facility] ED from 08/05/2021 in [facility]  C-SSRS RISK CATEGORY High Risk Error: Question 6 not populated Error: Q3, 4, or 5 should not be populated when Q2 is No      The patient demonstrates the following risk factors for suicide: Chronic risk factors for suicide include: psychiatric disorder of  Major Depressive Disorder, recurrent episode, severe (HCC),  Alcohol use disorder, severe, dependence (HCC), substance use disorder, and previous suicide attempts Pt attempted suicide twice today (08/25/2021) . Acute risk factors for suicide include: unemployment and grief . Protective factors for this patient include:  None . Considering these factors, the overall suicide risk at this point appears to be high. Patient is not appropriate for outpatient follow up.  Gary [name] is a 35 year old male who presents voluntary and accompanied by two police officer to [facility]. Pt's IVC paperwork is pending. Clinician asked the pt, "what brought you to the hospital?" Pt reports, he can't be trusted that's why he's in handcuffs. Pt reports, if he was not in handcuffs he would fight anyone because he wants help and is not sure he will get help. Pt reports, he was drinking, was told by a friend to stop and get some help. Per pt, he then "played" (walked in and out of traffic) in traffic, someone called the police and was taken to the hospital. Pt reports, the police officers helped calm him down. Clinicain  asked the pt if he has access to weapons. Pt reports, he's eyeing the officers guns. Pt reports, if uncuffed he would try to get the officers guns and use it on himself. Pt reports, Pt reports, seeing spots and having premonitions of death and violence. Pt denies, self-injurious behaviors.   Pt reports, for the past two weeks, drinking about six, fifths of Vodka. Pt then reports, for the past two days he's been drinking six bottles of wine per day. Pt reports, he stopped drinking around 830pm. Pt reports, he needs detox or he will die. Pt's BAL is pending. Pt denies, being linked to OPT resources (medication management and/or counseling.) Per chart, pt was discharged from [facility] on 07/31/2021.  Pt presents irritable in handcuffs with normal speech. Clinician observed the pt apologized for using profanity. Pt's mood was depressed, irritable. Pt's affect was congruent with mood. Pt's insight was fair. Pt's judgement is impaired. Pt reports, if discharged he will "act up."   Diagnosis: Major Depressive Disorder, recurrent episode, severe (HCC)                    Alcohol use disorder, severe, dependence (HCC).   *Pt reports, he has no way to get in contact with his family.*  Chief Complaint:  Chief Complaint  Patient presents with   Suicidal   Visit Diagnosis:     CCA Screening, Triage and Referral (STR)  Patient Reported Information How did you hear about [place]? Legal System  What Is the Reason for Your Visit/Call Today? Per EDP/PA note: "a 35 y.o. male  was evaluated in triage. Pt brought in  by police after a suicide attempt. Patient was located trying to jump into traffic, and punching traffic signs. A friend of his called the police. Patient tells me now that he is suicidal and his plan is to stab himself with a sharp object he can find.  Also reporting that he may go through alcohol withdrawals eventually because he drinks 6-7 bottles of wine. Last drink today."  How Long Has This Been Causing  You Problems? > than 6 months  What Do You Feel Would Help You the Most Today? Alcohol or Drug Use Treatment; Treatment for Depression or other mood problem; Housing Assistance; Stress Management   Have You Recently Had Any Thoughts About Hurting Yourself? Yes  Are You Planning to Commit Suicide/Harm Yourself At This time? Yes   Have you Recently Had Thoughts About Hurting Someone Gary [name]? Yes  Are You Planning to Harm Someone at This Time? No  Explanation: I violence in my head.   Have You Used Any Alcohol or Drugs in the Past 24 Hours? Yes  How Long Ago Did You Use Drugs or Alcohol? 0100  What Did You Use and How Much? Pt reports, for the past two weeks drinking about six, fifths of Vodka. Pt then reports, for the past two days he's been drinking six bottles of wine per day.   Do You Currently Have a Therapist/Psychiatrist? No  Name of Therapist/Psychiatrist: No data recorded  Have You Been Recently Discharged From Any Office Practice or Programs? No  Explanation of Discharge From Practice/Program: No data recorded    CCA Screening Triage Referral Assessment Type of Contact: Tele-Assessment  Telemedicine Service Delivery: Telemedicine service delivery: This service was provided via telemedicine using a 2-way, interactive audio and video technology  Is this Initial or Reassessment? Initial Assessment  Date Telepsych consult ordered in [facility]:  08/25/21  Time Telepsych consult ordered in [facility]:  2252  Location of Assessment: [facility] ED  Provider Location: [facility] Assessment Services   Collateral Involvement: Pt reports, he has no way to get in contact with his family.   Does Patient Have a Automotive engineer Guardian? No data recorded Name and Contact of Legal Guardian: Self  If Minor and Not Living with Parent(s), Who has Custody? NA  Is CPS involved or ever been involved? Never  Is APS involved or ever been involved? Never   Patient Determined To Be At Risk for  Harm To Self or Others Based on Review of Patient Reported Information or Presenting Complaint? Yes, for Self-Harm  Method: No data recorded Availability of Means: No data recorded Intent: No data recorded Notification Required: No data recorded Additional Information for Danger to Others Potential: No data recorded Additional Comments for Danger to Others Potential: No data recorded Are There Guns or Other Weapons in Your Home? No data recorded Types of Guns/Weapons: No data recorded Are These Weapons Safely Secured?                            No data recorded Who Could Verify You Are Able To Have These Secured: No data recorded Do You Have any Outstanding Charges, Pending Court Dates, Parole/Probation? No data recorded Contacted To Inform of Risk of Harm To Self or Others: Unable to Contact:    Does Patient Present under Involuntary Commitment? Yes  IVC Papers Initial File Date: 08/25/21   [place] of Residence: [place]   Patient Currently Receiving the Following Services: Not Receiving Services  Determination of Need: Emergent (2 hours)   Options For Referral: Inpatient Hospitalization     CCA Biopsychosocial Patient Reported Schizophrenia/Schizoaffective Diagnosis in Past: No   Strengths: Pt is wanting to detox, start treatment and be around positive people.   Mental Health Symptoms Depression:   Difficulty Concentrating; Fatigue; Increase/decrease in appetite; Irritability; Sleep (too much or little); Tearfulness   Duration of Depressive symptoms:    Mania:   None   Anxiety:    Fatigue; Sleep; Tension; Worrying   Psychosis:   None   Duration of Psychotic symptoms:    Trauma:   None   Obsessions:   -- (UTA)   Compulsions:   -- (UTA)   Inattention:   -- (UTA)   Hyperactivity/Impulsivity:   -- (UTA)   Oppositional/Defiant Behaviors:   Angry; Temper   Emotional Irregularity:   Recurrent suicidal behaviors/gestures/threats;  Intense/inappropriate anger   Other Mood/Personality Symptoms:   None    Mental Status Exam Appearance and self-care  Stature:   Tall   Weight:   Thin   Clothing:   Disheveled   Grooming:   Neglected   Cosmetic use:   None   Posture/gait:   Tense   Motor activity:   Restless   Sensorium  Attention:   Normal   Concentration:   Anxiety interferes   Orientation:   Person; Place; Situation; Object   Recall/memory:   Normal   Affect and Mood  Affect:   Other (Comment) (Congruent with mood.)   Mood:   Depressed; Irritable   Relating  Eye contact:   Normal   Facial expression:   Depressed   Attitude toward examiner:   Cooperative   Thought and Language  Speech flow:  Normal   Thought content:   Appropriate to Mood and Circumstances   Preoccupation:   None   Hallucinations:   Auditory   Organization:  No data recorded  [employer] of Knowledge:   Fair   Intelligence:   Average   Abstraction:   Normal   Judgement:   Normal   Reality Testing:   Adequate   Insight:   Fair   Decision Making:   Impulsive   Social Functioning  Social Maturity:   Isolates   Social Judgement:   Impropriety   Stress  Stressors:   Grief/losses; Housing   Coping Ability:   Deficient supports; Overwhelmed; Exhausted   Skill Deficits:   Responsibility; Self-control   Supports:   Support needed     Religion: Religion/Spirituality Are You A Religious Person?: Yes What is Your Religious Affiliation?: Christian How Might This Affect Treatment?: NA  Leisure/Recreation: Leisure / Recreation Do You Have Hobbies?:  (UTA)  Exercise/Diet: Exercise/Diet Do You Exercise?:  (UTA) Do You Follow a Special Diet?: No Do You Have Any Trouble Sleeping?: Yes Explanation of Sleeping Difficulties: Pt reports, difficulty sleeping being homeless and sleeping outside.   CCA Employment/Education Employment/Work  Situation: Employment / Work Situation Employment Situation: Unemployed Patient's Job has Been Impacted by Current Illness: Yes (Per chart.) Describe how Patient's Job has Been Impacted: Per chart, "unable to maintain employment." Has Patient ever Been in the [employer]?: No  Education: Education Is Patient Currently Attending School?: No Last Grade Completed: 12 Did You Attend College?: No   CCA Family/Childhood History Family and Relationship History: Family history Marital status: Long term relationship Long term relationship, how long?: UTA What types of issues is patient dealing with in the relationship?: UTA Additional relationship information: UTA Does patient  have children?: No  Childhood History:  Childhood History By whom was/is the patient raised?:  (UTA) Did patient suffer any verbal/emotional/physical/sexual abuse as a child?: No Did patient suffer from severe childhood neglect?:  (UTA) Has patient ever been sexually abused/assaulted/raped as an adolescent or adult?: No Was the patient ever a victim of a crime or a disaster?:  (UTA) Witnessed domestic violence?:  (UTA)  Child/Adolescent Assessment:     CCA Substance Use Alcohol/Drug Use: Alcohol / Drug Use Pain Medications: See MAR Prescriptions: See MAR Over the Counter: See MAR History of alcohol / drug use?: Yes Longest period of sobriety (when/how long): Per chart. 2 years Negative Consequences of Use: Financial, Legal, Personal relationships, Work / School Withdrawal Symptoms: Irritability, Nausea / Vomiting, Patient aware of relationship between substance abuse and physical/medical complications, Sweats, Diarrhea Onset of Seizures: Per chart, "during withdrawals." Date of most recent seizure: Per chart, "pt is not certain." Substance #1 Name of Substance 1: Alcohol. 1 - Age of First Use: UTA 1 - Amount (size/oz): Pt reports, for the past two weeks drinking about six, fifths of Vodka. Pt then  reports, for the past two days he's been drinking six bottles of wine per day. 1 - Frequency: Daily 1 - Duration: Ongoing 1 - Last Use / Amount: 08/25/2021 1 - Method of Aquiring: Store 1- Route of Use: Oral.    ASAM's:  Six Dimensions of Multidimensional Assessment  Dimension 1:  Acute Intoxication and/or Withdrawal Potential:   Dimension 1:  Description of individual's past and current experiences of substance use and withdrawal: Patient has a history of withdrawal seizures. Pt also reports, he's hot and sweating, diarrhea, irritable/angry.  Dimension 2:  Biomedical Conditions and Complications:   Dimension 2:  Description of patient's biomedical conditions and  complications: Patient has several medical issues negatively impacted by his use of alcohol.  Dimension 3:  Emotional, Behavioral, or Cognitive Conditions and Complications:  Dimension 3:  Description of emotional, behavioral, or cognitive conditions and complications: Pt attempted suicide twice today (08/25/2021) he tried drinking himself to death then "played" in traffic.  Dimension 4:  Readiness to Change:  Dimension 4:  Description of Readiness to Change criteria: Pt is wanting to detox, to start treatment and be around positive people.  Dimension 5:  Relapse, Continued use, or Continued Problem Potential:  Dimension 5:  Relapse, continued use, or continued problem potential critiera description: Patient has a history of chronic relapses.  Dimension 6:  Recovery/Living Environment:  Dimension 6:  Recovery/Iiving environment criteria description: Homeless on and off for years.  ASAM Severity Score: ASAM's Severity Rating Score: 15  ASAM Recommended Level of Treatment: ASAM Recommended Level of Treatment: Level III Residential Treatment   Substance use Disorder (SUD) Substance Use Disorder (SUD)  Checklist Symptoms of Substance Use: Continued use despite having a persistent/recurrent physical/psychological problem  caused/exacerbated by use, Continued use despite persistent or recurrent social, interpersonal problems, caused or exacerbated by use, Evidence of tolerance, Evidence of withdrawal (Comment), Large amounts of time spent to obtain, use or recover from the substance(s), Persistent desire or unsuccessful efforts to cut down or control use, Presence of craving or strong urge to use, Recurrent use that results in a failure to fulfill major role obligations (work, school, home), Repeated use in physically hazardous situations, Social, occupational, recreational activities given up or reduced due to use, Substance(s) often taken in larger amounts or over longer times than was intended  Recommendations for Services/Supports/Treatments: Recommendations for Services/Supports/Treatments Recommendations For Services/Supports/Treatments:  Residential-Level 3, Inpatient Hospitalization, Individual Therapy, Detox, Medication Management  Discharge Disposition:    DSM5 Diagnoses: Patient Active Problem List   Diagnosis Date Noted   Constipation 07/30/2021   Alcohol-induced insomnia (HCC)    MDD (major depressive disorder), recurrent episode, 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/26/2021   Neutropenia (HCC) 07/26/2021   Alcohol withdrawal seizure without complication (HCC)    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   Elevated troponin 10/19/2020   Alcohol withdrawal (HCC) 10/19/2020   Alcohol abuse    Suicidal ideation 06/14/2020   Pancreatitis 06/13/2020   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   Malnutrition of moderate degree 05/07/2020   Gastrointestinal hemorrhage    Alcoholic hepatitis without ascites    Melena 05/05/2020   Alcoholic ketoacidosis 05/05/2020   Thrombocytopenia (HCC) 07/25/2019   Transaminitis 07/25/2019   Traumatic subdural hematoma 07/02/2019   Alcohol abuse with intoxication (HCC) 07/02/2019   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   Alcohol use disorder, severe, dependence (HCC)  04/16/2019   Major depressive disorder, 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     Referrals to Alternative Service(s): Referred to Alternative Service(s):   Place:   Date:   Time:    Referred to Alternative Service(s):   Place:   Date:   Time:    Referred to Alternative Service(s):   Place:   Date:   Time:    Referred to Alternative Service(s):   Place:   Date:   Time:     Redmond Pulling, [facility] Comprehensive Clinical Assessment (CCA) Screening, Triage and Referral Note  08/26/2021 Gary [name] [id number]  Chief Complaint:  Chief Complaint  Patient presents with   Suicidal   Visit Diagnosis:   Patient Reported Information How did you hear about [place]? Legal System  What Is the Reason for Your Visit/Call Today? Per EDP/PA note: "a 35 y.o. male  was evaluated in triage. Pt brought in by police after a suicide attempt. Patient was located trying to jump into traffic, and punching traffic signs. A friend of his called the police. Patient tells me now that he is suicidal and his plan is to stab himself with a sharp object he can find.  Also reporting that he may go through alcohol withdrawals eventually because he drinks 6-7 bottles of wine. Last drink today."  How Long Has This Been Causing You Problems? > than 6 months  What Do You Feel Would Help You the Most Today? Alcohol or Drug Use Treatment; Treatment for Depression or other mood problem; Housing Assistance; Stress Management   Have You Recently Had Any Thoughts About Hurting Yourself? Yes  Are You Planning to Commit Suicide/Harm Yourself At This time? Yes   Have you Recently Had Thoughts About Hurting Someone Gary [name]? Yes  Are You Planning to Harm Someone at This Time? No  Explanation: I violence in my head.   Have You Used Any Alcohol or Drugs in the Past 24 Hours? Yes  How Long Ago Did You  Use Drugs or Alcohol? 0100  What Did You Use and How Much? Pt reports, for the past two weeks drinking about six, fifths of Vodka. Pt then reports, for the past two days he's been drinking six bottles of wine per day.   Do You Currently Have a Therapist/Psychiatrist? No  Name of Therapist/Psychiatrist: No data recorded  Have You Been Recently Discharged From Any Office Practice or Programs? No  Explanation of Discharge From Practice/Program: No data recorded   CCA Screening Triage Referral Assessment Type of Contact: Tele-Assessment  Telemedicine Service Delivery: Telemedicine service delivery: This service was provided via telemedicine using a 2-way, interactive audio and video technology  Is this Initial or Reassessment? Initial Assessment  Date Telepsych consult ordered in [facility]:  08/25/21  Time Telepsych consult ordered in [facility]:  2252  Location of Assessment: [facility]  Provider Location: [facility] Assessment Services   Collateral Involvement: Pt reports, he has no way to get in contact with his family.   Does Patient Have a Automotive engineer Guardian? No data recorded Name and Contact of Legal Guardian: Self  If Minor and Not Living with Parent(s), Who has Custody? NA  Is CPS involved or ever been involved? Never  Is APS involved or ever been involved? Never   Patient Determined To Be At Risk for Harm To Self or Others Based on Review of Patient Reported Information or Presenting Complaint? Yes, for Self-Harm  Method: No data recorded Availability of Means: No data recorded Intent: No data recorded Notification Required: No data recorded Additional Information for Danger to Others Potential: No data recorded Additional Comments for Danger to Others Potential: No data recorded Are There Guns or Other Weapons in Your Home? No data recorded Types of Guns/Weapons: No data recorded Are These Weapons Safely Secured?                            No data recorded Who Could  Verify You Are Able To Have These Secured: No data recorded Do You Have any Outstanding Charges, Pending Court Dates, Parole/Probation? No data recorded Contacted To Inform of Risk of Harm To Self or Others: Unable to Contact:   Does Patient Present under Involuntary Commitment? Yes  IVC Papers Initial File Date: 08/25/21   [place] of Residence: [place]   Patient Currently Receiving the Following Services: Not Receiving Services   Determination of Need: Emergent (2 hours)   Options For Referral: Inpatient Hospitalization   Discharge Disposition:     Redmond Pulling, [facility]       Redmond Pulling, [place], [facility], [facility] Triage Specialist [phone]

## 2021-08-26 NOTE — ED Notes (Signed)
Pt sitting in dining room watching TV. Pt A&O x4, calm and cooperative. Pt denies current SI/HI/AVH. Pt denies any current needs. No signs of acute distress noted. Will continue to monitor for safety. 

## 2021-08-26 NOTE — Progress Notes (Signed)
Pt's CIWA was 7. 

## 2021-08-26 NOTE — ED Provider Notes (Signed)
[facility]-EMERGENCY DEPT Provider Note   CSN: [id number] Arrival date & time: 08/25/21  2136     History Chief Complaint  Patient presents with   Suicidal    Gary [name] is a 35 y.o. male.  Patient to ED with [employer] after being found in traffic in an attempt to end his life. He was observed punching road signs per [employer]. On arrival, the patient endorses SI with plan to use a knife. Patient is well known to the Emergency Services with multiple visits for SI, alcohol intoxication, polysubstance abuse, HIV. He has been placed until Involuntary Commitment. He is currently in hand cuffs for aggressive behavior.   The history is provided by the police. No language interpreter was used.      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   Elevated troponin 10/19/2020   Alcohol withdrawal (HCC) 10/19/2020   Alcohol abuse    Suicidal ideation 06/14/2020   Pancreatitis 06/13/2020   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   Malnutrition of moderate degree 05/07/2020   Gastrointestinal hemorrhage    Alcoholic hepatitis without ascites    Melena 05/05/2020   Alcoholic ketoacidosis 05/05/2020   Thrombocytopenia (HCC) 07/25/2019   Transaminitis 07/25/2019   Traumatic subdural hematoma 07/02/2019   Alcohol abuse with intoxication (HCC) 07/02/2019   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    Past Surgical History:  Procedure Laterality Date   INCISION AND DRAINAGE PERIRECTAL ABSCESS N/A 09/24/2016   Procedure: IRRIGATION AND DEBRIDEMENT PERIRECTAL ABSCESS;  Surgeon: Ricarda Frame, MD;  Location: [facility] ORS;  Service: General;  Laterality: N/A;   none         Family History  Problem Relation Age of Onset   Alcohol abuse Mother    Alcohol abuse Father     Social History   Tobacco Use   Smoking status: Every Day    Packs/day: 0.50    Types: Cigarettes   Smokeless tobacco: Never   Tobacco comments:    unable to smoke while incarcerated 6+ months 02/13/20  Vaping Use   Vaping Use: Never used  Substance Use Topics   Alcohol use: Yes    Comment: "I drink all I can"   Drug use: Yes    Types: Methamphetamines, Marijuana    Comment: last used 04/10/2019    Home Medications Prior to Admission medications   Medication Sig Start Date End Date Taking? Authorizing Provider  bictegravir-emtricitabine-tenofovir AF (BIKTARVY) 50-200-25 MG TABS tablet Take 1 tablet by mouth daily after lunch. 07/31/21 08/30/21 Yes Doda, Traci Sermon, MD  benztropine (COGENTIN) 1 MG tablet Take 1 tablet (1 mg total) by mouth 2 (two) times daily. Patient not taking: Reported on 08/26/2021 08/09/21 09/08/21  Melene Plan, DO  doxepin (SINEQUAN) 10 MG capsule Take 1 capsule (10 mg total) by mouth at bedtime. Patient not  taking: Reported on 08/26/2021 07/31/21   Karsten Ro, MD  folic acid (FOLVITE) 1 MG tablet Take 1 tablet (1 mg total) by mouth daily. Patient not taking: Reported on 08/26/2021 08/01/21   Karsten Ro, MD  gabapentin (NEURONTIN) 300 MG capsule Take 1 capsule (300 mg total) by mouth 3 (three) times daily. Patient not taking: Reported on 08/26/2021 07/31/21 10/29/21  Karsten Ro, MD  Multiple Vitamin (MULTIVITAMIN WITH MINERALS) TABS tablet Take 1 tablet by mouth  daily. Patient not taking: Reported on 08/26/2021 07/31/21   Karsten Ro, MD  pantoprazole (PROTONIX) 20 MG tablet Take 1 tablet (20 mg total) by mouth 2 (two) times daily. Patient not taking: Reported on 08/26/2021 07/31/21   Karsten Ro, MD  thiamine 100 MG tablet Take 1 tablet (100 mg total) by mouth daily. Patient not taking: Reported on 08/26/2021 08/01/21 08/31/21  Karsten Ro, MD  famotidine (PEPCID) 20 MG tablet Take 1 tablet (20 mg total) by mouth 2 (two) times daily. Patient not taking: Reported on 06/01/2019 05/14/19 06/02/19  Benjiman Core, MD    Allergies    Tegretol [carbamazepine] and Caffeine  Review of Systems   Review of Systems  Unable to perform ROS: Other (Acute intoxication)   Physical Exam Updated Vital Signs BP 116/82   Pulse (!) 103   Temp 98.2 F (36.8 C)   Resp (!) 22   Ht 6' (1.829 m)   Wt 86 kg   SpO2 97%   BMI 25.71 kg/m   Physical Exam Vitals and nursing note reviewed.  Constitutional:      Appearance: He is well-developed.  Pulmonary:     Effort: Pulmonary effort is normal.  Musculoskeletal:        General: Normal range of motion.     Cervical back: Normal range of motion.  Skin:    General: Skin is warm and dry.  Neurological:     Mental Status: He is alert and oriented to person, place, and time.  Psychiatric:        Mood and Affect: Affect is angry.        Speech: Speech is slurred.        Behavior: Behavior is agitated.        Thought Content: Thought content includes suicidal ideation.    ED Results / Procedures / Treatments   Labs (all labs ordered are listed, but only abnormal results are displayed) Labs Reviewed  CBC WITH DIFFERENTIAL/PLATELET - Abnormal; Notable for the following components:      Result Value   WBC 3.9 (*)    RDW 16.2 (*)    Platelets 143 (*)    Neutro Abs 1.4 (*)    All other components within normal limits  ETHANOL - Abnormal; Notable for the following components:   Alcohol, Ethyl (B) 333 (*)     All other components within normal limits  SALICYLATE LEVEL - Abnormal; Notable for the following components:   Salicylate Lvl <7.0 (*)    All other components within normal limits  ACETAMINOPHEN LEVEL - Abnormal; Notable for the following components:   Acetaminophen (Tylenol), Serum 1 (*)    All other components within normal limits  COMPREHENSIVE METABOLIC PANEL - Abnormal; Notable for the following components:   AST 56 (*)    All other components within normal limits  RAPID URINE DRUG SCREEN, HOSP PERFORMED - Abnormal; Notable for the following components:   Benzodiazepines POSITIVE (*)    All other components within normal limits  RESP PANEL BY  RT-PCR (FLU A&B, COVID) [id number]   Results for orders placed or performed during the hospital encounter of 08/25/21  Resp Panel by RT-PCR (Flu A&B, Covid) Nasopharyngeal Swab   Specimen: Nasopharyngeal Swab; Nasopharyngeal(NP) swabs in vial transport medium  Result Value Ref Range   SARS Coronavirus 2 by RT PCR NEGATIVE NEGATIVE   Influenza A by PCR NEGATIVE NEGATIVE   Influenza B by PCR NEGATIVE NEGATIVE  CBC with Differential  Result Value Ref Range   WBC 3.9 (L) 4.0 - 10.5 K/uL   RBC 4.45 4.22 - 5.81 MIL/uL   Hemoglobin 15.1 13.0 - 17.0 g/dL   HCT 16.6 06.3 - 01.6 %   MCV 98.9 80.0 - 100.0 fL   MCH 33.9 26.0 - 34.0 pg   MCHC 34.3 30.0 - 36.0 g/dL   RDW 01.0 (H) 93.2 - 35.5 %   Platelets 143 (L) 150 - 400 K/uL   nRBC 0.0 0.0 - 0.2 %   Neutrophils Relative % 35 %   Neutro Abs 1.4 (L) 1.7 - 7.7 K/uL   Lymphocytes Relative 50 %   Lymphs Abs 1.9 0.7 - 4.0 K/uL   Monocytes Relative 11 %   Monocytes Absolute 0.4 0.1 - 1.0 K/uL   Eosinophils Relative 3 %   Eosinophils Absolute 0.1 0.0 - 0.5 K/uL   Basophils Relative 1 %   Basophils Absolute 0.0 0.0 - 0.1 K/uL   Immature Granulocytes 0 %   Abs Immature Granulocytes 0.00 0.00 - 0.07 K/uL  Ethanol  Result Value Ref Range   Alcohol, Ethyl (B) 333 (HH) <10 mg/dL  Salicylate level   Result Value Ref Range   Salicylate Lvl <7.0 (L) 7.0 - 30.0 mg/dL  Acetaminophen level  Result Value Ref Range   Acetaminophen (Tylenol), Serum 1 (L) 10 - 30 ug/mL  Comprehensive metabolic panel  Result Value Ref Range   Sodium 138 135 - 145 mmol/L   Potassium 3.7 3.5 - 5.1 mmol/L   Chloride 101 98 - 111 mmol/L   CO2 26 22 - 32 mmol/L   Glucose, Bld 99 70 - 99 mg/dL   BUN 6 6 - 20 mg/dL   Creatinine, Ser 7.32 0.61 - 1.24 mg/dL   Calcium 9.3 8.9 - 20.2 mg/dL   Total Protein 8.0 6.5 - 8.1 g/dL   Albumin 4.8 3.5 - 5.0 g/dL   AST 56 (H) 15 - 41 U/L   ALT 35 0 - 44 U/L   Alkaline Phosphatase 56 38 - 126 U/L   Total Bilirubin 0.5 0.3 - 1.2 mg/dL   GFR, Estimated >54 >27 mL/min   Anion gap 11 5 - 15  Urine rapid drug screen (hosp performed)  Result Value Ref Range   Opiates NONE DETECTED NONE DETECTED   Cocaine NONE DETECTED NONE DETECTED   Benzodiazepines POSITIVE (A) NONE DETECTED   Amphetamines NONE DETECTED NONE DETECTED   Tetrahydrocannabinol NONE DETECTED NONE DETECTED   Barbiturates NONE DETECTED NONE DETECTED     EKG None  Radiology No results found.  Procedures Procedures   Medications Ordered in ED Medications  LORazepam (ATIVAN) injection 0-4 mg ( Intravenous See Alternative 08/26/21 0050)    Or  LORazepam (ATIVAN) tablet 0-4 mg (2 mg Oral Given 08/26/21 0050)  LORazepam (ATIVAN) injection 0-4 mg (has no administration in time range)    Or  LORazepam (ATIVAN) tablet 0-4 mg (has no administration in time range)  thiamine tablet 100 mg (has no administration in time range)    Or  thiamine (  B-1) injection 100 mg (has no administration in time range)  acetaminophen (TYLENOL) tablet 650 mg (650 mg Oral Given 08/26/21 0257)    ED Course  I have reviewed the triage vital signs and the nursing notes.  Pertinent labs & imaging results that were available during my care of the patient were reviewed by me and considered in my medical decision making (see chart  for details).    MDM Rules/Calculators/A&P                           Patient to ED by [employer], acutely intoxicated, making statements of suicidal intention. IVC placed by ED provider staff. He remains in hand cuffs, [employer] in the room for safety.   He is considered medically cleared for TTS consultation, although alcohol level will have to reduce prior to assessment. Will continue to observe. He is placed under CIWA protocol. VSS.   Patient has been assessed and is found to meet inpatient criteria.   Final Clinical Impression(s) / ED Diagnoses Final diagnoses:  None   Suicidal ideation Alcohol abuse  Rx / DC Orders ED Discharge Orders     None        Danne Harbor 08/26/21 2203    Molpus, Jonny Ruiz, MD 08/27/21 1401

## 2021-08-26 NOTE — ED Notes (Signed)
Pt engaged and participated in AA/NA group.  

## 2021-08-26 NOTE — Progress Notes (Signed)
Pt transferred from [facility]  and is admitted to [facility] due to passive SI. Pt verbally contracts for safety on unit. Pt is alert and oriented. Pt is ambulatory and is oriented to staff/unit. Pt was cooperative with skin assessment. Abrasion was noted on pt's right knuckle. Per pt, it was due to him punching a tree. Site cleaned and dressing applied. Scattered scars were on pt's bilateral legs. Administered scheduled meds with no incident. Pt denies current HI/AVH. Staff will monitor for pt's safety.

## 2021-08-26 NOTE — BH Assessment (Addendum)
[facility] Assessment Progress Note   Per Caryn Bee, NP, this pt would benefit from admission to [facility] at this time.  Pt presents under IVC initiated by EDP Pricilla Loveless, MD, but this has been rescinded by Nelly Rout, MD.  Earlene Plater, MD has agreed to accept to to [facility].  Pt has signed Voluntary Admission and Consent for Treatment.  EDP Melene Plan, DO and pt's nurse, Huntley Dec, have been notified, and Huntley Dec agrees to send original paperwork along with pt via [employer], and to call report to [phone].  Doylene Canning, [place] [facility] Coordinator [phone]

## 2021-08-26 NOTE — ED Provider Notes (Addendum)
Behavioral Health Admission H&P [facility] & OBS)  Date: 08/26/21 Patient Name: Gary [name] MRN: [id number] Chief Complaint: No chief complaint on file.     Diagnoses:  Final diagnoses:  Substance induced mood disorder (HCC)  Alcohol withdrawal syndrome without complication (HCC)  Alcohol use disorder, severe, dependence (HCC)    HPI:  35 year old male with history of substance use, depression, HIV, who presented to [facility] ED on 10/17 for SI, patient was found attempting to jump into traffic and punching traffic signs.  Patient reported he was suicidal with a plan to stab himself at that time.  Patient reported drinking 6-7 bottles of wine a day last drink being prior to presentation.  It was assessed by TTS.  Patient had reported that for the past 2 weeks he was drinking 6 fifths of vodka daily for the past 2 days he been drinking 6 bottles of wine.  Patient was initially, however patient has been calm.  Patient was accepted to [facility] for further treatment on 10/18. Etoh 333 and UDS+BZD + on presentation.  Per chart review patient was hospitalized at [facility] from 07/26/2021-07/29/21 for SI and alcohol use.  Patient was discharged with Biktarvy, doxepin 10 mg nightly, folic acid 1 mg daily, gabapentin 300 mg 3 times daily for alcohol use disorder, multivitamin, pantoprazole 40 mg daily, thiamine 100 mg.  Patient has had several presentations related to alcohol use.  Patient interviewed in office this afternoon.  Patient is calm, cooperative, and pleasant.  Patient states that he is "not 100% sure" about how he ended up at the hospital.  Patient states he believes he told a friend that he wanted assistance with drinking who then brought him to the hospital.  Patient reports drinking 6-7 bottles of wine daily for approximately the last month.  Patient states [facility] [place] up until about a month ago when he resumed drinking.  Patient also reports that  he also consumes. 3.  Patient states his last drink was about was over 24 hours ago.  He reports current withdrawal symptoms of nausea, weakness, tremors.  Patient recalls recent admission to [facility] behavioral health.  He states that upon discharge he was not provided any scripts for medications and therefore has not been taking medications as prescribed.  Patient is a poor historian and is unable to recall if [facility] before or after his admission at [facility].  Patient denies SI/HI/AVH.  Patient denies paranoia.  Discussed with patient what services he would like upon discharge-patient indicates he is unsure if he would like to attend rehab as he would like to get back to work since he is able.  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  Patient states that he would be interested in housing resources upon discharge as he is currently homeless.    Obtained from chart review and patient Past Psychiatric History: Previous Medication Trials: doxepin, gabapentin Previous Psychiatric Hospitalizations: yes, several; most recently at [facility] 07/2021 as above Previous Suicide Attempts: yes  Outpatient psychiatrist: no  Social History: Marital Status: not married Children: 0 Source of Income: Aeronautical engineer Education:  [employer] graduate Special Ed: no Housing Status: homeless History of phys/sexual abuse: no Easy access to gun: no  Substance Use (with emphasis over the last 12 months) Recreational Drugs: alcohol Use of Alcohol: heavy Tobacco Use: yes " a couple cigarettes" Rehab History: yes, jan 2022 [facility] for 14 days, has  not been to any rehab since that time H/O Complicated Withdrawal: yes  Legal History: Past Charges/Incarcerations: denies Pending charges: denies  Family Psychiatric History: Alcohol abuse runs in family       PHQ 2-9:  Flowsheet Row ED from 02/11/2021 in [facility] EMERGENCY DEPARTMENT  Thoughts that you  would be better off dead, or of hurting yourself in some way More than half the days  PHQ-9 Total Score 23       Flowsheet Row ED from 08/26/2021 in [facility] ED from 08/25/2021 in [facility] from 08/08/2021 in [facility] EMERGENCY DEPARTMENT  C-SSRS RISK CATEGORY Low Risk High Risk Error: Question 6 not populated        Total Time spent with patient: 30 minutes  Musculoskeletal  Strength & Muscle Tone: within normal limits Gait & Station: broad based, slow Patient leans: N/A  Psychiatric Specialty Exam  Presentation General Appearance: Appropriate for Environment; Casual  Eye Contact:Fair  Speech:Clear and Coherent; Normal Rate  Speech Volume:Normal  Handedness:Right   Mood and Affect  Mood:Dysphoric  Affect:Congruent   Thought Process  Thought Processes:Coherent; Goal Directed; Linear  Descriptions of Associations:Intact  Orientation:Full (Time, Place and Person)  Thought Content:WDL; Logical  Diagnosis of Schizophrenia or Schizoaffective disorder in past: No  Duration of Psychotic Symptoms: Less than six months  Hallucinations:Hallucinations: None Ideas of Reference:None  Suicidal Thoughts:Suicidal Thoughts: No Homicidal Thoughts:Homicidal Thoughts: No  Sensorium  Memory:Immediate Fair; Recent Poor; Remote Poor  Judgment:Intact  Insight:Present   Executive Functions  Concentration:Fair  Attention Span:Fair  Recall:Poor  Fund of Knowledge:Fair  Language:Fair   Psychomotor Activity  Psychomotor Activity:Psychomotor Activity: Normal; Tremor (tremor in BUE with outstretched arms)  Assets  Assets:Communication Skills; Desire for Improvement; Resilience   Sleep  Sleep:Sleep: Fair  No data recorded  Physical Exam Constitutional:      Appearance: Normal appearance. He is normal weight.  HENT:     Head: Normocephalic and atraumatic.  Eyes:      Extraocular Movements: Extraocular movements intact.  Cardiovascular:     Rate and Rhythm: Normal rate and regular rhythm.     Pulses: Normal pulses.     Heart sounds: Normal heart sounds.  Pulmonary:     Effort: Pulmonary effort is normal.  Abdominal:     General: Bowel sounds are normal.     Palpations: Abdomen is soft.  Neurological:     General: No focal deficit present.     Mental Status: He is alert and oriented to person, place, and time.  Psychiatric:        Attention and Perception: Attention and perception normal.        Speech: Speech normal.        Behavior: Behavior normal. Behavior is cooperative.        Thought Content: Thought content normal.   Review of Systems  Constitutional:  Negative for chills and fever.  HENT:  Negative for hearing loss.   Eyes:  Negative for discharge and redness.  Respiratory:  Negative for cough.   Cardiovascular:  Negative for chest pain.  Gastrointestinal:  Positive for nausea and vomiting.  Musculoskeletal:  Negative for myalgias.  Neurological:  Positive for tremors. Negative for headaches.  Psychiatric/Behavioral:  Positive for substance abuse. Negative for depression and suicidal ideas. The patient is nervous/anxious.    Blood pressure 127/77, pulse 79, temperature 98.9 F (37.2 C), temperature source Oral, resp. rate 18, SpO2 99 %. There  is no height or weight on file to calculate BMI.  Past Psychiatric History: AUD, severe;  MDD  Is the patient at risk to self? No  Has the patient been a risk to self in the past 6 months? No .    Has the patient been a risk to self within the distant past? No   Is the patient a risk to others? No   Has the patient been a risk to others in the past 6 months? No   Has the patient been a risk to others within the distant past? No   Past Medical History:  Past Medical History:  Diagnosis Date   Alcohol abuse    HIV (human immunodeficiency virus infection) (HCC)    Seizures (HCC)     Subdural hematoma     Past Surgical History:  Procedure Laterality Date   INCISION AND DRAINAGE PERIRECTAL ABSCESS N/A 09/24/2016   Procedure: IRRIGATION AND DEBRIDEMENT PERIRECTAL ABSCESS;  Surgeon: Ricarda Frame, MD;  Location: [facility] ORS;  Service: General;  Laterality: N/A;   none      Family History:  Family History  Problem Relation Age of Onset   Alcohol abuse Mother    Alcohol abuse Father     Social History:  Social History   Socioeconomic History   Marital status: Single    Spouse name: Not on file   Number of children: Not on file   Years of education: Not on file   Highest education level: Not on file  Occupational History   Occupation: unemployed  Tobacco Use   Smoking status: Every Day    Packs/day: 0.50    Types: Cigarettes   Smokeless tobacco: Never   Tobacco comments:    unable to smoke while incarcerated 6+ months 02/13/20  Vaping Use   Vaping Use: Never used  Substance and Sexual Activity   Alcohol use: Yes    Comment: "I drink all I can"   Drug use: Yes    Types: Methamphetamines, Marijuana    Comment: last used 04/10/2019   Sexual activity: Yes    Partners: Male, Male    Comment: declined condoms  03/2021  Other Topics Concern   Not on file  Social History Narrative   "Currently living on the streets"   Independent at baseline   Social Determinants of Health   Financial Resource Strain: Not on file  Food Insecurity: Not on file  Transportation Needs: Not on file  Physical Activity: Not on file  Stress: Not on file  Social Connections: Not on file  Intimate Partner Violence: Not on file    SDOH:  SDOH Screenings   Alcohol Screen: Medium Risk   Last Alcohol Screening Score (AUDIT): 25  Depression (PHQ2-9): Medium Risk   PHQ-2 Score: 23  Financial Resource Strain: Not on file  Food Insecurity: Not on file  Housing: Not on file  Physical Activity: Not on file  Social Connections: Not on file  Stress: Not on file  Tobacco Use:  High Risk   Smoking Tobacco Use: Every Day   Smokeless Tobacco Use: Never  Transportation Needs: Not on file    Last Labs:  Admission on 08/25/2021, Discharged on 08/26/2021  Component Date Value Ref Range Status   WBC 08/26/2021 3.9 (A) 4.0 - 10.5 K/uL Final   RBC 08/26/2021 4.45  4.22 - 5.81 MIL/uL Final   Hemoglobin 08/26/2021 15.1  13.0 - 17.0 g/dL Final   HCT 58/59/2924 44.0  39.0 - 52.0 % Final  MCV 08/26/2021 98.9  80.0 - 100.0 fL Final   MCH 08/26/2021 33.9  26.0 - 34.0 pg Final   MCHC 08/26/2021 34.3  30.0 - 36.0 g/dL Final   RDW 16/08/9603 16.2 (A) 11.5 - 15.5 % Final   Platelets 08/26/2021 143 (A) 150 - 400 K/uL Final   nRBC 08/26/2021 0.0  0.0 - 0.2 % Final   Neutrophils Relative % 08/26/2021 35  % Final   Neutro Abs 08/26/2021 1.4 (A) 1.7 - 7.7 K/uL Final   Lymphocytes Relative 08/26/2021 50  % Final   Lymphs Abs 08/26/2021 1.9  0.7 - 4.0 K/uL Final   Monocytes Relative 08/26/2021 11  % Final   Monocytes Absolute 08/26/2021 0.4  0.1 - 1.0 K/uL Final   Eosinophils Relative 08/26/2021 3  % Final   Eosinophils Absolute 08/26/2021 0.1  0.0 - 0.5 K/uL Final   Basophils Relative 08/26/2021 1  % Final   Basophils Absolute 08/26/2021 0.0  0.0 - 0.1 K/uL Final   Immature Granulocytes 08/26/2021 0  % Final   Abs Immature Granulocytes 08/26/2021 0.00  0.00 - 0.07 K/uL Final   Performed at [facility], [address] [place], [place] [address]   Alcohol, Ethyl (B) 08/26/2021 333 (A) <10 mg/dL Final   Comment: CRITICAL RESULT CALLED TO, READ BACK BY AND VERIFIED WITH: MERRICKS LOU RN 08/26/21 @ 0308 VS (NOTE) Lowest detectable limit for serum alcohol is 10 mg/dL.  For medical purposes only. Performed at [facility], [address], [place], [place] [address]    Salicylate Lvl 08/26/2021 <7.0 (A) 7.0 - 30.0 mg/dL Final   Performed at [facility], [address], [place], [place] [address]   Acetaminophen  (Tylenol), Serum 08/26/2021 1 (A) 10 - 30 ug/mL Final   Performed at [facility], [address] [place], [place] [address]   Sodium 08/26/2021 138  135 - 145 mmol/L Final   Potassium 08/26/2021 3.7  3.5 - 5.1 mmol/L Final   Chloride 08/26/2021 101  98 - 111 mmol/L Final   CO2 08/26/2021 26  22 - 32 mmol/L Final   Glucose, Bld 08/26/2021 99  70 - 99 mg/dL Final   Glucose reference range applies only to samples taken after fasting for at least 8 hours.   BUN 08/26/2021 6  6 - 20 mg/dL Final   Creatinine, Ser 08/26/2021 0.65  0.61 - 1.24 mg/dL Final   Calcium 86/57/8469 9.3  8.9 - 10.3 mg/dL Final   Total Protein 62/95/2841 8.0  6.5 - 8.1 g/dL Final   Albumin 32/44/0102 4.8  3.5 - 5.0 g/dL Final   AST 72/53/6644 56 (A) 15 - 41 U/L Final   ALT 08/26/2021 35  0 - 44 U/L Final   Alkaline Phosphatase 08/26/2021 56  38 - 126 U/L Final   Total Bilirubin 08/26/2021 0.5  0.3 - 1.2 mg/dL Final   GFR, Estimated 08/26/2021 >60  >60 mL/min Final   Comment: (NOTE) Calculated using the CKD-EPI Creatinine Equation (2021)    Anion gap 08/26/2021 11  5 - 15 Final   Performed at [facility], [address], [place], [place] [address]   Opiates 08/26/2021 NONE DETECTED  NONE DETECTED Final   Cocaine 08/26/2021 NONE DETECTED  NONE DETECTED Final   Benzodiazepines 08/26/2021 POSITIVE (A) NONE DETECTED Final   Amphetamines 08/26/2021 NONE DETECTED  NONE DETECTED Final   Tetrahydrocannabinol 08/26/2021 NONE DETECTED  NONE DETECTED Final   Barbiturates 08/26/2021  NONE DETECTED  NONE DETECTED Final   Comment: (NOTE) DRUG SCREEN FOR MEDICAL PURPOSES ONLY.  IF CONFIRMATION IS NEEDED FOR ANY PURPOSE, NOTIFY LAB WITHIN 5 DAYS.  LOWEST DETECTABLE LIMITS FOR URINE DRUG SCREEN Drug Class                     Cutoff (ng/mL) Amphetamine and metabolites    1000 Barbiturate and metabolites    200 Benzodiazepine                 200 Tricyclics and metabolites     300 Opiates  and metabolites        300 Cocaine and metabolites        300 THC                            50 Performed at [facility], [address], [place], [place] [address]    SARS Coronavirus 2 by RT PCR 08/26/2021 NEGATIVE  NEGATIVE Final   Comment: (NOTE) SARS-CoV-2 target nucleic acids are NOT DETECTED.  The SARS-CoV-2 RNA is generally detectable in upper respiratory specimens during the acute phase of infection. The lowest concentration of SARS-CoV-2 viral copies this assay can detect is 138 copies/mL. A negative result does not preclude SARS-Cov-2 infection and should not be used as the sole basis for treatment or other patient management decisions. A negative result may occur with  improper specimen collection/handling, submission of specimen other than nasopharyngeal swab, presence of viral mutation(s) within the areas targeted by this assay, and inadequate number of viral copies(<138 copies/mL). A negative result must be combined with clinical observations, patient history, and epidemiological information. The expected result is Negative.  Fact Sheet for Patients:  BloggerCourse.com  Fact Sheet for Healthcare Providers:  SeriousBroker.it  This test is no                          t yet approved or cleared by the [place] FDA and  has been authorized for detection and/or diagnosis of SARS-CoV-2 by FDA under an Emergency Use Authorization (EUA). This EUA will remain  in effect (meaning this test can be used) for the duration of the COVID-19 declaration under Section 564(b)(1) of the Act, 21 U.S.C.section 360bbb-3(b)(1), unless the authorization is terminated  or revoked sooner.       Influenza A by PCR 08/26/2021 NEGATIVE  NEGATIVE Final   Influenza B by PCR 08/26/2021 NEGATIVE  NEGATIVE Final   Comment: (NOTE) The Xpert Xpress SARS-CoV-2/FLU/RSV plus assay is intended as an aid in the diagnosis of  influenza from Nasopharyngeal swab specimens and should not be used as a sole basis for treatment. Nasal washings and aspirates are unacceptable for Xpert Xpress SARS-CoV-2/FLU/RSV testing.  Fact Sheet for Patients: BloggerCourse.com  Fact Sheet for Healthcare Providers: SeriousBroker.it  This test is not yet approved or cleared by the [place] FDA and has been authorized for detection and/or diagnosis of SARS-CoV-2 by FDA under an Emergency Use Authorization (EUA). This EUA will remain in effect (meaning this test can be used) for the duration of the COVID-19 declaration under Section 564(b)(1) of the Act, 21 U.S.C. section 360bbb-3(b)(1), unless the authorization is terminated or revoked.  Performed at [facility], [address], [place], [place] [address]   Admission on 08/08/2021, Discharged on 08/09/2021  Component Date Value Ref Range Status   WBC 08/08/2021 4.7  4.0 - 10.5 K/uL Final   RBC 08/08/2021 4.29  4.22 - 5.81 MIL/uL Final   Hemoglobin 08/08/2021 14.5  13.0 - 17.0 g/dL Final   HCT 16/08/9603 43.0  39.0 - 52.0 % Final   MCV 08/08/2021 100.2 (A) 80.0 - 100.0 fL Final   MCH 08/08/2021 33.8  26.0 - 34.0 pg Final   MCHC 08/08/2021 33.7  30.0 - 36.0 g/dL Final   RDW 54/07/8118 16.9 (A) 11.5 - 15.5 % Final   Platelets 08/08/2021 460 (A) 150 - 400 K/uL Final   nRBC 08/08/2021 0.0  0.0 - 0.2 % Final   Neutrophils Relative % 08/08/2021 45  % Final   Neutro Abs 08/08/2021 2.1  1.7 - 7.7 K/uL Final   Lymphocytes Relative 08/08/2021 45  % Final   Lymphs Abs 08/08/2021 2.2  0.7 - 4.0 K/uL Final   Monocytes Relative 08/08/2021 6  % Final   Monocytes Absolute 08/08/2021 0.3  0.1 - 1.0 K/uL Final   Eosinophils Relative 08/08/2021 2  % Final   Eosinophils Absolute 08/08/2021 0.1  0.0 - 0.5 K/uL Final   Basophils Relative 08/08/2021 2  % Final   Basophils Absolute 08/08/2021 0.1  0.0 - 0.1 K/uL Final    Immature Granulocytes 08/08/2021 0  % Final   Abs Immature Granulocytes 08/08/2021 0.00  0.00 - 0.07 K/uL Final   Performed at [facility], [address] [place], [place] [address]   Sodium 08/08/2021 138  135 - 145 mmol/L Final   Potassium 08/08/2021 3.6  3.5 - 5.1 mmol/L Final   Chloride 08/08/2021 101  98 - 111 mmol/L Final   CO2 08/08/2021 19 (A) 22 - 32 mmol/L Final   Glucose, Bld 08/08/2021 82  70 - 99 mg/dL Final   Glucose reference range applies only to samples taken after fasting for at least 8 hours.   BUN 08/08/2021 <5 (A) 6 - 20 mg/dL Final   Creatinine, Ser 08/08/2021 0.76  0.61 - 1.24 mg/dL Final   Calcium 95/62/1308 9.4  8.9 - 10.3 mg/dL Final   Total Protein 65/78/4696 7.8  6.5 - 8.1 g/dL Final   Albumin 29/52/8413 4.3  3.5 - 5.0 g/dL Final   AST 24/40/1027 46 (A) 15 - 41 U/L Final   ALT 08/08/2021 32  0 - 44 U/L Final   Alkaline Phosphatase 08/08/2021 68  38 - 126 U/L Final   Total Bilirubin 08/08/2021 0.7  0.3 - 1.2 mg/dL Final   GFR, Estimated 08/08/2021 >60  >60 mL/min Final   Comment: (NOTE) Calculated using the CKD-EPI Creatinine Equation (2021)    Anion gap 08/08/2021 18 (A) 5 - 15 Final   Performed at [facility], [address] [place], [place] [address]   Alcohol, Ethyl (B) 08/08/2021 281 (A) <10 mg/dL Final   Comment: (NOTE) Lowest detectable limit for serum alcohol is 10 mg/dL.  For medical purposes only. Performed at [facility], [address] [place], [place] [address]    Lipase 08/08/2021 53 (A) 11 - 51 U/L Final   Performed at [facility], [address] [place], [place] [address]   Color, Urine 08/08/2021 YELLOW  YELLOW Final   APPearance 08/08/2021 CLEAR  CLEAR Final   Specific Gravity, Urine 08/08/2021 1.009  1.005 - 1.030 Final   pH 08/08/2021 5.0  5.0 - 8.0 Final   Glucose, UA 08/08/2021 NEGATIVE  NEGATIVE mg/dL Final   Hgb urine dipstick 08/08/2021 NEGATIVE  NEGATIVE Final  Bilirubin Urine 08/08/2021  NEGATIVE  NEGATIVE Final   Ketones, ur 08/08/2021 20 (A) NEGATIVE mg/dL Final   Protein, ur 09/81/1914 NEGATIVE  NEGATIVE mg/dL Final   Nitrite 78/29/5621 NEGATIVE  NEGATIVE Final   Leukocytes,Ua 08/08/2021 NEGATIVE  NEGATIVE Final   Performed at [facility], [address], [place], [place] [address]  Admission on 08/05/2021, Discharged on 08/05/2021  Component Date Value Ref Range Status   Glucose-Capillary 08/05/2021 73  70 - 99 mg/dL Final   Glucose reference range applies only to samples taken after fasting for at least 8 hours.  Admission on 07/26/2021, Discharged on 07/31/2021  Component Date Value Ref Range Status   WBC 07/26/2021 2.7 (A) 4.0 - 10.5 K/uL Final   RBC 07/26/2021 3.94 (A) 4.22 - 5.81 MIL/uL Final   Hemoglobin 07/26/2021 13.7  13.0 - 17.0 g/dL Final   HCT 78/46/9629 40.3  39.0 - 52.0 % Final   MCV 07/26/2021 102.3 (A) 80.0 - 100.0 fL Final   MCH 07/26/2021 34.8 (A) 26.0 - 34.0 pg Final   MCHC 07/26/2021 34.0  30.0 - 36.0 g/dL Final   RDW 52/84/1324 17.0 (A) 11.5 - 15.5 % Final   Platelets 07/26/2021 143 (A) 150 - 400 K/uL Final   Comment: Immature Platelet Fraction may be clinically indicated, consider ordering this additional test [id number] REPEATED TO VERIFY    nRBC 07/26/2021 0.0  0.0 - 0.2 % Final   Performed at [facility], [address] [place], [place] [address]   Sodium 07/26/2021 135  135 - 145 mmol/L Final   Potassium 07/26/2021 3.3 (A) 3.5 - 5.1 mmol/L Final   Chloride 07/26/2021 99  98 - 111 mmol/L Final   CO2 07/26/2021 28  22 - 32 mmol/L Final   Glucose, Bld 07/26/2021 94  70 - 99 mg/dL Final   Glucose reference range applies only to samples taken after fasting for at least 8 hours.   BUN 07/26/2021 8  6 - 20 mg/dL Final   Creatinine, Ser 07/26/2021 0.57 (A) 0.61 - 1.24 mg/dL Final   Calcium 03/47/4259 9.6  8.9 - 10.3 mg/dL Final   Total Protein 56/38/7564 7.4  6.5 - 8.1 g/dL Final   Albumin 33/29/5188 4.1  3.5 -  5.0 g/dL Final   AST 41/66/0630 34  15 - 41 U/L Final   ALT 07/26/2021 24  0 - 44 U/L Final   Alkaline Phosphatase 07/26/2021 75  38 - 126 U/L Final   Total Bilirubin 07/26/2021 1.7 (A) 0.3 - 1.2 mg/dL Final   GFR, Estimated 07/26/2021 >60  >60 mL/min Final   Comment: (NOTE) Calculated using the CKD-EPI Creatinine Equation (2021)    Anion gap 07/26/2021 8  5 - 15 Final   Performed at [facility], [address], [place], [place] [address]   Cholesterol 07/26/2021 198  0 - 200 mg/dL Final   Triglycerides 93/23/5573 209 (A) <150 mg/dL Final   HDL 22/12/5425 52  >40 mg/dL Final   Total CHOL/HDL Ratio 07/26/2021 3.8  RATIO Final   VLDL 07/26/2021 42 (A) 0 - 40 mg/dL Final   LDL Cholesterol 07/26/2021 104 (A) 0 - 99 mg/dL Final   Comment:        Total Cholesterol/HDL:CHD Risk Coronary Heart Disease Risk Table                     Men   Women  1/2 Average Risk   3.4   3.3  Average Risk       5.0   4.4  2 X Average Risk   9.6   7.1  3 X Average Risk  23.4   11.0        Use the calculated Patient Ratio above and the CHD Risk Table to determine the patient's CHD Risk.        ATP III CLASSIFICATION (LDL):  <100     mg/dL   Optimal  604-540  mg/dL   Near or Above                    Optimal  130-159  mg/dL   Borderline  981-191  mg/dL   High  >478     mg/dL   Very High Performed at [facility], [address] [place], [place] [address]    Total Protein 07/26/2021 7.5  6.5 - 8.1 g/dL Final   Albumin 13/06/6577 4.0  3.5 - 5.0 g/dL Final   AST 46/96/2952 35  15 - 41 U/L Final   ALT 07/26/2021 24  0 - 44 U/L Final   Alkaline Phosphatase 07/26/2021 75  38 - 126 U/L Final   Total Bilirubin 07/26/2021 1.7 (A) 0.3 - 1.2 mg/dL Final   Bilirubin, Direct 07/26/2021 0.3 (A) 0.0 - 0.2 mg/dL Final   Indirect Bilirubin 07/26/2021 1.4 (A) 0.3 - 0.9 mg/dL Final   Performed at [facility], [address], [place], [place] [address]   Hgb  A1c MFr Bld 07/26/2021 4.3 (A) 4.8 - 5.6 % Final   Comment: (NOTE) Pre diabetes:          5.7%-6.4%  Diabetes:              >6.4%  Glycemic control for   <7.0% adults with diabetes    Mean Plasma Glucose 07/26/2021 76.71  mg/dL Final   Performed at [facility], [address] [place], [place] [address]   WBC 07/26/2021 3.1 (A) 4.0 - 10.5 K/uL Final   RBC 07/26/2021 4.03 (A) 4.22 - 5.81 MIL/uL Final   Hemoglobin 07/26/2021 13.7  13.0 - 17.0 g/dL Final   HCT 27/25/3664 41.2  39.0 - 52.0 % Final   MCV 07/26/2021 102.2 (A) 80.0 - 100.0 fL Final   MCH 07/26/2021 34.0  26.0 - 34.0 pg Final   MCHC 07/26/2021 33.3  30.0 - 36.0 g/dL Final   RDW 40/34/7425 17.0 (A) 11.5 - 15.5 % Final   Platelets 07/26/2021 133 (A) 150 - 400 K/uL Final   nRBC 07/26/2021 0.0  0.0 - 0.2 % Final   Neutrophils Relative % 07/26/2021 68  % Final   Neutro Abs 07/26/2021 2.1  1.7 - 7.7 K/uL Final   Lymphocytes Relative 07/26/2021 22  % Final   Lymphs Abs 07/26/2021 0.7  0.7 - 4.0 K/uL Final   Monocytes Relative 07/26/2021 6  % Final   Monocytes Absolute 07/26/2021 0.2  0.1 - 1.0 K/uL Final   Eosinophils Relative 07/26/2021 3  % Final   Eosinophils Absolute 07/26/2021 0.1  0.0 - 0.5 K/uL Final   Basophils Relative 07/26/2021 1  % Final   Basophils Absolute 07/26/2021 0.0  0.0 - 0.1 K/uL Final   Immature Granulocytes 07/26/2021 0  % Final   Abs Immature Granulocytes 07/26/2021 0.01  0.00 - 0.07 K/uL Final   Performed at [facility], [address], [place], [place] [address]   Sodium 07/26/2021 138  135 - 145 mmol/L  Final   Potassium 07/26/2021 3.5  3.5 - 5.1 mmol/L Final   Chloride 07/26/2021 101  98 - 111 mmol/L Final   CO2 07/26/2021 28  22 - 32 mmol/L Final   Glucose, Bld 07/26/2021 102 (A) 70 - 99 mg/dL Final   Glucose reference range applies only to samples taken after fasting for at least 8 hours.   BUN 07/26/2021 9  6 - 20 mg/dL Final   Creatinine, Ser 07/26/2021 0.72  0.61 -  1.24 mg/dL Final   Calcium 16/08/9603 9.9  8.9 - 10.3 mg/dL Final   Total Protein 54/07/8118 7.6  6.5 - 8.1 g/dL Final   Albumin 14/78/2956 4.3  3.5 - 5.0 g/dL Final   AST 21/30/8657 37  15 - 41 U/L Final   ALT 07/26/2021 23  0 - 44 U/L Final   Alkaline Phosphatase 07/26/2021 74  38 - 126 U/L Final   Total Bilirubin 07/26/2021 1.6 (A) 0.3 - 1.2 mg/dL Final   GFR, Estimated 07/26/2021 >60  >60 mL/min Final   Comment: (NOTE) Calculated using the CKD-EPI Creatinine Equation (2021)    Anion gap 07/26/2021 9  5 - 15 Final   Performed at [facility], [address], [place], [place] [address]   Magnesium 07/26/2021 1.6 (A) 1.7 - 2.4 mg/dL Final   Performed at [facility], [address] [place], [place] [address]   HIV 1 RNA Quant 07/26/2021 WRORD  copies/mL Corrected   Comment: (NOTE) Test not performed. The required specimen for the test ordered was not received. Received frozen whole blood Requires frozen plasma 07/28/2021 The reportable range for this assay is 20 to 10,000,000 copies HIV-1 RNA/mL.    TSH 07/26/2021 2.079  0.350 - 4.500 uIU/mL Final   Comment: Performed by a 3rd Generation assay with a functional sensitivity of <=0.01 uIU/mL. Performed at [facility], [address] [place], [place] [address]    Sodium 07/28/2021 134 (A) 135 - 145 mmol/L Final   Potassium 07/28/2021 4.1  3.5 - 5.1 mmol/L Final   Chloride 07/28/2021 99  98 - 111 mmol/L Final   CO2 07/28/2021 26  22 - 32 mmol/L Final   Glucose, Bld 07/28/2021 96  70 - 99 mg/dL Final   Glucose reference range applies only to samples taken after fasting for at least 8 hours.   BUN 07/28/2021 11  6 - 20 mg/dL Final   Creatinine, Ser 07/28/2021 0.74  0.61 - 1.24 mg/dL Final   Calcium 40/08/2724 9.2  8.9 - 10.3 mg/dL Final   GFR, Estimated 07/28/2021 >60  >60 mL/min Final   Comment: (NOTE) Calculated using the CKD-EPI Creatinine Equation (2021)    Anion  gap 07/28/2021 9  5 - 15 Final   Performed at [facility], [address] [place], [place] [address]   WBC 07/28/2021 3.6 (A) 4.0 - 10.5 K/uL Final   RBC 07/28/2021 3.87 (A) 4.22 - 5.81 MIL/uL Final   Hemoglobin 07/28/2021 13.3  13.0 - 17.0 g/dL Final   HCT 03/47/4259 40.0  39.0 - 52.0 % Final   MCV 07/28/2021 103.4 (A) 80.0 - 100.0 fL Final   MCH 07/28/2021 34.4 (A) 26.0 - 34.0 pg Final   MCHC 07/28/2021 33.3  30.0 - 36.0 g/dL Final   RDW 56/38/7564 17.2 (A) 11.5 - 15.5 % Final   Platelets 07/28/2021 140 (A) 150 - 400 K/uL Final   nRBC 07/28/2021 0.0  0.0 - 0.2 % Final  Neutrophils Relative % 07/28/2021 59  % Final   Neutro Abs 07/28/2021 2.1  1.7 - 7.7 K/uL Final   Lymphocytes Relative 07/28/2021 28  % Final   Lymphs Abs 07/28/2021 1.0  0.7 - 4.0 K/uL Final   Monocytes Relative 07/28/2021 9  % Final   Monocytes Absolute 07/28/2021 0.3  0.1 - 1.0 K/uL Final   Eosinophils Relative 07/28/2021 3  % Final   Eosinophils Absolute 07/28/2021 0.1  0.0 - 0.5 K/uL Final   Basophils Relative 07/28/2021 1  % Final   Basophils Absolute 07/28/2021 0.0  0.0 - 0.1 K/uL Final   Immature Granulocytes 07/28/2021 0  % Final   Abs Immature Granulocytes 07/28/2021 0.01  0.00 - 0.07 K/uL Final   Performed at [facility], [address] [place], [place] [address]   Magnesium 07/28/2021 2.1  1.7 - 2.4 mg/dL Final   Performed at [facility], [address], [place], [place] [address]   HIV 1 RNA Quant 07/30/2021 40  copies/mL Corrected   Comment: (NOTE) The reportable range for this assay is 20 to 10,000,000 copies HIV-1 RNA/mL.    LOG10 HIV-1 RNA 07/30/2021 1.602  log10copy/mL Final   Comment: (NOTE) Performed At: [facility] [address] [place], [place] [id number] Jolene Schimke MD WG:[id number]   Admission on 07/24/2021, Discharged on 07/26/2021  Component Date Value Ref Range Status   WBC 07/24/2021 2.8 (A) 4.0 - 10.5 K/uL  Final   RBC 07/24/2021 4.00 (A) 4.22 - 5.81 MIL/uL Final   Hemoglobin 07/24/2021 13.5  13.0 - 17.0 g/dL Final   HCT 78/46/9629 40.3  39.0 - 52.0 % Final   MCV 07/24/2021 100.8 (A) 80.0 - 100.0 fL Final   MCH 07/24/2021 33.8  26.0 - 34.0 pg Final   MCHC 07/24/2021 33.5  30.0 - 36.0 g/dL Final   RDW 52/84/1324 17.5 (A) 11.5 - 15.5 % Final   Platelets 07/24/2021 180  150 - 400 K/uL Final   nRBC 07/24/2021 0.0  0.0 - 0.2 % Final   Neutrophils Relative % 07/24/2021 37  % Final   Neutro Abs 07/24/2021 1.0 (A) 1.7 - 7.7 K/uL Final   Lymphocytes Relative 07/24/2021 49  % Final   Lymphs Abs 07/24/2021 1.4  0.7 - 4.0 K/uL Final   Monocytes Relative 07/24/2021 9  % Final   Monocytes Absolute 07/24/2021 0.2  0.1 - 1.0 K/uL Final   Eosinophils Relative 07/24/2021 4  % Final   Eosinophils Absolute 07/24/2021 0.1  0.0 - 0.5 K/uL Final   Basophils Relative 07/24/2021 1  % Final   Basophils Absolute 07/24/2021 0.0  0.0 - 0.1 K/uL Final   Immature Granulocytes 07/24/2021 0  % Final   Abs Immature Granulocytes 07/24/2021 0.00  0.00 - 0.07 K/uL Final   Performed at [facility], [address], [place], [place] [address]   Sodium 07/24/2021 139  135 - 145 mmol/L Final   Potassium 07/24/2021 3.7  3.5 - 5.1 mmol/L Final   Chloride 07/24/2021 101  98 - 111 mmol/L Final   CO2 07/24/2021 26  22 - 32 mmol/L Final   Glucose, Bld 07/24/2021 98  70 - 99 mg/dL Final   Glucose reference range applies only to samples taken after fasting for at least 8 hours.   BUN 07/24/2021 5 (A) 6 - 20 mg/dL Final   Creatinine, Ser 07/24/2021 0.64  0.61 - 1.24 mg/dL Final   Calcium 72/53/6644 9.0  8.9 - 10.3 mg/dL  Final   Total Protein 07/24/2021 7.3  6.5 - 8.1 g/dL Final   Albumin 16/08/9603 4.2  3.5 - 5.0 g/dL Final   AST 54/07/8118 41  15 - 41 U/L Final   ALT 07/24/2021 27  0 - 44 U/L Final   Alkaline Phosphatase 07/24/2021 75  38 - 126 U/L Final   Total Bilirubin 07/24/2021 1.0  0.3 - 1.2 mg/dL Final    GFR, Estimated 07/24/2021 >60  >60 mL/min Final   Comment: (NOTE) Calculated using the CKD-EPI Creatinine Equation (2021)    Anion gap 07/24/2021 12  5 - 15 Final   Performed at [facility], [address], [place], [place] [address]   Alcohol, Ethyl (B) 07/24/2021 231 (A) <10 mg/dL Final   Comment: (NOTE) Lowest detectable limit for serum alcohol is 10 mg/dL.  For medical purposes only. Performed at [facility], [address], [place], [place] [address]    Salicylate Lvl 07/24/2021 <7.0 (A) 7.0 - 30.0 mg/dL Final   Performed at [facility], [address], [place], [place] [address]   Acetaminophen (Tylenol), Serum 07/24/2021 <10 (A) 10 - 30 ug/mL Final   Comment: (NOTE) Therapeutic concentrations vary significantly. A range of 10-30 ug/mL  may be an effective concentration for many patients. However, some  are best treated at concentrations outside of this range. Acetaminophen concentrations >150 ug/mL at 4 hours after ingestion  and >50 ug/mL at 12 hours after ingestion are often associated with  toxic reactions.  Performed at [facility], [address] [place], [place] [address]    Opiates 07/24/2021 NONE DETECTED  NONE DETECTED Final   Cocaine 07/24/2021 NONE DETECTED  NONE DETECTED Final   Benzodiazepines 07/24/2021 POSITIVE (A) NONE DETECTED Final   Amphetamines 07/24/2021 NONE DETECTED  NONE DETECTED Final   Tetrahydrocannabinol 07/24/2021 NONE DETECTED  NONE DETECTED Final   Barbiturates 07/24/2021 NONE DETECTED  NONE DETECTED Final   Comment: (NOTE) DRUG SCREEN FOR MEDICAL PURPOSES ONLY.  IF CONFIRMATION IS NEEDED FOR ANY PURPOSE, NOTIFY LAB WITHIN 5 DAYS.  LOWEST DETECTABLE LIMITS FOR URINE DRUG SCREEN Drug Class                     Cutoff (ng/mL) Amphetamine and metabolites    1000 Barbiturate and metabolites    200 Benzodiazepine                 200 Tricyclics and metabolites      300 Opiates and metabolites        300 Cocaine and metabolites        300 THC                            50 Performed at [facility], [address], [place], [place] [address]    SARS Coronavirus 2 by RT PCR 07/24/2021 NEGATIVE  NEGATIVE Final   Comment: (NOTE) SARS-CoV-2 target nucleic acids are NOT DETECTED.  The SARS-CoV-2 RNA is generally detectable in upper respiratory specimens during the acute phase of infection. The lowest concentration of SARS-CoV-2 viral copies this assay can detect is 138 copies/mL. A negative result does not preclude SARS-Cov-2 infection and should not be used as the sole basis for treatment or other patient management decisions. A negative result may occur with  improper specimen collection/handling, submission of specimen other than nasopharyngeal swab, presence of viral mutation(s) within the areas targeted  by this assay, and inadequate number of viral copies(<138 copies/mL). A negative result must be combined with clinical observations, patient history, and epidemiological information. The expected result is Negative.  Fact Sheet for Patients:  BloggerCourse.com  Fact Sheet for Healthcare Providers:  SeriousBroker.it  This test is no                          t yet approved or cleared by the [place] FDA and  has been authorized for detection and/or diagnosis of SARS-CoV-2 by FDA under an Emergency Use Authorization (EUA). This EUA will remain  in effect (meaning this test can be used) for the duration of the COVID-19 declaration under Section 564(b)(1) of the Act, 21 U.S.C.section 360bbb-3(b)(1), unless the authorization is terminated  or revoked sooner.       Influenza A by PCR 07/24/2021 NEGATIVE  NEGATIVE Final   Influenza B by PCR 07/24/2021 NEGATIVE  NEGATIVE Final   Comment: (NOTE) The Xpert Xpress SARS-CoV-2/FLU/RSV plus assay is intended as an aid in the  diagnosis of influenza from Nasopharyngeal swab specimens and should not be used as a sole basis for treatment. Nasal washings and aspirates are unacceptable for Xpert Xpress SARS-CoV-2/FLU/RSV testing.  Fact Sheet for Patients: BloggerCourse.com  Fact Sheet for Healthcare Providers: SeriousBroker.it  This test is not yet approved or cleared by the [place] FDA and has been authorized for detection and/or diagnosis of SARS-CoV-2 by FDA under an Emergency Use Authorization (EUA). This EUA will remain in effect (meaning this test can be used) for the duration of the COVID-19 declaration under Section 564(b)(1) of the Act, 21 U.S.C. section 360bbb-3(b)(1), unless the authorization is terminated or revoked.  Performed at [facility], [address] [place], [place] [address]    Troponin I (High Sensitivity) 07/25/2021 4  <18 ng/L Final   Comment: (NOTE) Elevated high sensitivity troponin I (hsTnI) values and significant  changes across serial measurements may suggest ACS but many other  chronic and acute conditions are known to elevate hsTnI results.  Refer to the "Links" section for chest pain algorithms and additional  guidance. Performed at [facility], [address], [place], [place] [address]   Admission on 07/20/2021, Discharged on 07/21/2021  Component Date Value Ref Range Status   Alcohol, Ethyl (B) 07/20/2021 308 (A) <10 mg/dL Final   Comment: CRITICAL RESULT CALLED TO, READ BACK BY AND VERIFIED WITH: NASH, J @1931  BY BATTLET (NOTE) Lowest detectable limit for serum alcohol is 10 mg/dL.  For medical purposes only. Performed at [facility], [address], [place], [place] [address]    WBC 07/20/2021 3.6 (A) 4.0 - 10.5 K/uL Final   RBC 07/20/2021 4.28  4.22 - 5.81 MIL/uL Final   Hemoglobin 07/20/2021 14.4  13.0 - 17.0 g/dL Final   HCT 91/47/8295 42.8   39.0 - 52.0 % Final   MCV 07/20/2021 100.0  80.0 - 100.0 fL Final   MCH 07/20/2021 33.6  26.0 - 34.0 pg Final   MCHC 07/20/2021 33.6  30.0 - 36.0 g/dL Final   RDW 62/13/0865 17.3 (A) 11.5 - 15.5 % Final   Platelets 07/20/2021 274  150 - 400 K/uL Final   nRBC 07/20/2021 0.0  0.0 - 0.2 % Final   Neutrophils Relative % 07/20/2021 42  % Final   Neutro Abs 07/20/2021 1.5 (A) 1.7 - 7.7 K/uL Final   Lymphocytes Relative 07/20/2021 43  % Final  Lymphs Abs 07/20/2021 1.6  0.7 - 4.0 K/uL Final   Monocytes Relative 07/20/2021 9  % Final   Monocytes Absolute 07/20/2021 0.3  0.1 - 1.0 K/uL Final   Eosinophils Relative 07/20/2021 4  % Final   Eosinophils Absolute 07/20/2021 0.1  0.0 - 0.5 K/uL Final   Basophils Relative 07/20/2021 2  % Final   Basophils Absolute 07/20/2021 0.1  0.0 - 0.1 K/uL Final   Immature Granulocytes 07/20/2021 0  % Final   Abs Immature Granulocytes 07/20/2021 0.01  0.00 - 0.07 K/uL Final   Performed at [facility], [address], [place], [place] [address]   Sodium 07/20/2021 138  135 - 145 mmol/L Final   Potassium 07/20/2021 3.6  3.5 - 5.1 mmol/L Final   Chloride 07/20/2021 101  98 - 111 mmol/L Final   CO2 07/20/2021 26  22 - 32 mmol/L Final   Glucose, Bld 07/20/2021 97  70 - 99 mg/dL Final   Glucose reference range applies only to samples taken after fasting for at least 8 hours.   BUN 07/20/2021 5 (A) 6 - 20 mg/dL Final   Creatinine, Ser 07/20/2021 0.61  0.61 - 1.24 mg/dL Final   Calcium 89/38/1017 8.9  8.9 - 10.3 mg/dL Final   Total Protein 51/12/5850 8.1  6.5 - 8.1 g/dL Final   Albumin 77/82/4235 4.3  3.5 - 5.0 g/dL Final   AST 36/14/4315 52 (A) 15 - 41 U/L Final   ALT 07/20/2021 35  0 - 44 U/L Final   Alkaline Phosphatase 07/20/2021 89  38 - 126 U/L Final   Total Bilirubin 07/20/2021 0.9  0.3 - 1.2 mg/dL Final   GFR, Estimated 07/20/2021 >60  >60 mL/min Final   Comment: (NOTE) Calculated using the CKD-EPI Creatinine Equation (2021)    Anion  gap 07/20/2021 11  5 - 15 Final   Performed at [facility], [address] [place], [place] [address]   Lipase 07/20/2021 40  11 - 51 U/L Final   Performed at [facility], [address], [place], [place] [address]   SARS Coronavirus 2 by RT PCR 07/21/2021 NEGATIVE  NEGATIVE Final   Comment: (NOTE) SARS-CoV-2 target nucleic acids are NOT DETECTED.  The SARS-CoV-2 RNA is generally detectable in upper respiratory specimens during the acute phase of infection. The lowest concentration of SARS-CoV-2 viral copies this assay can detect is 138 copies/mL. A negative result does not preclude SARS-Cov-2 infection and should not be used as the sole basis for treatment or other patient management decisions. A negative result may occur with  improper specimen collection/handling, submission of specimen other than nasopharyngeal swab, presence of viral mutation(s) within the areas targeted by this assay, and inadequate number of viral copies(<138 copies/mL). A negative result must be combined with clinical observations, patient history, and epidemiological information. The expected result is Negative.  Fact Sheet for Patients:  BloggerCourse.com  Fact Sheet for Healthcare Providers:  SeriousBroker.it  This test is no                          t yet approved or cleared by the [place] FDA and  has been authorized for detection and/or diagnosis of SARS-CoV-2 by FDA under an Emergency Use Authorization (EUA). This EUA will remain  in effect (meaning this test can be used) for the duration of the COVID-19 declaration under Section 564(b)(1) of the Act, 21 U.S.C.section 360bbb-3(b)(1), unless the authorization  is terminated  or revoked sooner.       Influenza A by PCR 07/21/2021 NEGATIVE  NEGATIVE Final   Influenza B by PCR 07/21/2021 NEGATIVE  NEGATIVE Final   Comment: (NOTE) The Xpert Xpress  SARS-CoV-2/FLU/RSV plus assay is intended as an aid in the diagnosis of influenza from Nasopharyngeal swab specimens and should not be used as a sole basis for treatment. Nasal washings and aspirates are unacceptable for Xpert Xpress SARS-CoV-2/FLU/RSV testing.  Fact Sheet for Patients: BloggerCourse.com  Fact Sheet for Healthcare Providers: SeriousBroker.it  This test is not yet approved or cleared by the [place] FDA and has been authorized for detection and/or diagnosis of SARS-CoV-2 by FDA under an Emergency Use Authorization (EUA). This EUA will remain in effect (meaning this test can be used) for the duration of the COVID-19 declaration under Section 564(b)(1) of the Act, 21 U.S.C. section 360bbb-3(b)(1), unless the authorization is terminated or revoked.  Performed at [facility], [address] [place], [place] [address]   Admission on 07/11/2021, Discharged on 07/13/2021  Component Date Value Ref Range Status   SARS Coronavirus 2 by RT PCR 07/11/2021 NEGATIVE  NEGATIVE Final   Comment: (NOTE) SARS-CoV-2 target nucleic acids are NOT DETECTED.  The SARS-CoV-2 RNA is generally detectable in upper respiratory specimens during the acute phase of infection. The lowest concentration of SARS-CoV-2 viral copies this assay can detect is 138 copies/mL. A negative result does not preclude SARS-Cov-2 infection and should not be used as the sole basis for treatment or other patient management decisions. A negative result may occur with  improper specimen collection/handling, submission of specimen other than nasopharyngeal swab, presence of viral mutation(s) within the areas targeted by this assay, and inadequate number of viral copies(<138 copies/mL). A negative result must be combined with clinical observations, patient history, and epidemiological information. The expected result is Negative.  Fact  Sheet for Patients:  BloggerCourse.com  Fact Sheet for Healthcare Providers:  SeriousBroker.it  This test is no                          t yet approved or cleared by the [place] FDA and  has been authorized for detection and/or diagnosis of SARS-CoV-2 by FDA under an Emergency Use Authorization (EUA). This EUA will remain  in effect (meaning this test can be used) for the duration of the COVID-19 declaration under Section 564(b)(1) of the Act, 21 U.S.C.section 360bbb-3(b)(1), unless the authorization is terminated  or revoked sooner.       Influenza A by PCR 07/11/2021 NEGATIVE  NEGATIVE Final   Influenza B by PCR 07/11/2021 NEGATIVE  NEGATIVE Final   Comment: (NOTE) The Xpert Xpress SARS-CoV-2/FLU/RSV plus assay is intended as an aid in the diagnosis of influenza from Nasopharyngeal swab specimens and should not be used as a sole basis for treatment. Nasal washings and aspirates are unacceptable for Xpert Xpress SARS-CoV-2/FLU/RSV testing.  Fact Sheet for Patients: BloggerCourse.com  Fact Sheet for Healthcare Providers: SeriousBroker.it  This test is not yet approved or cleared by the [place] FDA and has been authorized for detection and/or diagnosis of SARS-CoV-2 by FDA under an Emergency Use Authorization (EUA). This EUA will remain in effect (meaning this test can be used) for the duration of the COVID-19 declaration under Section 564(b)(1) of the Act, 21 U.S.C. section 360bbb-3(b)(1), unless the authorization is terminated or revoked.  Performed at [facility], [address] [place],  [place] [address]    Sodium 07/11/2021 139  135 - 145 mmol/L Final   Potassium 07/11/2021 3.8  3.5 - 5.1 mmol/L Final   Chloride 07/11/2021 103  98 - 111 mmol/L Final   CO2 07/11/2021 24  22 - 32 mmol/L Final   Glucose, Bld 07/11/2021 99  70 - 99 mg/dL Final   Glucose  reference range applies only to samples taken after fasting for at least 8 hours.   BUN 07/11/2021 <5 (A) 6 - 20 mg/dL Final   Creatinine, Ser 07/11/2021 0.68  0.61 - 1.24 mg/dL Final   Calcium 60/45/4098 8.3 (A) 8.9 - 10.3 mg/dL Final   Total Protein 11/91/4782 6.8  6.5 - 8.1 g/dL Final   Albumin 95/62/1308 3.5  3.5 - 5.0 g/dL Final   AST 65/78/4696 58 (A) 15 - 41 U/L Final   ALT 07/11/2021 42  0 - 44 U/L Final   Alkaline Phosphatase 07/11/2021 85  38 - 126 U/L Final   Total Bilirubin 07/11/2021 0.9  0.3 - 1.2 mg/dL Final   GFR, Estimated 07/11/2021 >60  >60 mL/min Final   Comment: (NOTE) Calculated using the CKD-EPI Creatinine Equation (2021)    Anion gap 07/11/2021 12  5 - 15 Final   Performed at [facility], [address], [place], [place] [address]   Alcohol, Ethyl (B) 07/11/2021 388 (A) <10 mg/dL Final   Comment: CRITICAL RESULT CALLED TO, READ BACK BY AND VERIFIED WITH: Teryl Lucy RN 07/11/21 1516 M KOROLESKI (NOTE) Lowest detectable limit for serum alcohol is 10 mg/dL.  For medical purposes only. Performed at [facility], [address] [place], [place] [address]    Opiates 07/11/2021 NONE DETECTED  NONE DETECTED Final   Cocaine 07/11/2021 NONE DETECTED  NONE DETECTED Final   Benzodiazepines 07/11/2021 POSITIVE (A) NONE DETECTED Final   Amphetamines 07/11/2021 NONE DETECTED  NONE DETECTED Final   Tetrahydrocannabinol 07/11/2021 NONE DETECTED  NONE DETECTED Final   Barbiturates 07/11/2021 NONE DETECTED  NONE DETECTED Final   Comment: (NOTE) DRUG SCREEN FOR MEDICAL PURPOSES ONLY.  IF CONFIRMATION IS NEEDED FOR ANY PURPOSE, NOTIFY LAB WITHIN 5 DAYS.  LOWEST DETECTABLE LIMITS FOR URINE DRUG SCREEN Drug Class                     Cutoff (ng/mL) Amphetamine and metabolites    1000 Barbiturate and metabolites    200 Benzodiazepine                 200 Tricyclics and metabolites     300 Opiates and metabolites        300 Cocaine and metabolites         300 THC                            50 Performed at [facility], [address] [place], [place] [address]    WBC 07/11/2021 4.2  4.0 - 10.5 K/uL Final   RBC 07/11/2021 3.43 (A) 4.22 - 5.81 MIL/uL Final   Hemoglobin 07/11/2021 11.5 (A) 13.0 - 17.0 g/dL Final   HCT 72/53/6644 33.9 (A) 39.0 - 52.0 % Final   MCV 07/11/2021 98.8  80.0 - 100.0 fL Final   MCH 07/11/2021 33.5  26.0 - 34.0 pg Final   MCHC 07/11/2021 33.9  30.0 - 36.0 g/dL Final   RDW 03/47/4259 16.4 (A) 11.5 - 15.5 % Final   Platelets 07/11/2021  148 (A) 150 - 400 K/uL Final   nRBC 07/11/2021 0.0  0.0 - 0.2 % Final   Neutrophils Relative % 07/11/2021 60  % Final   Neutro Abs 07/11/2021 2.5  1.7 - 7.7 K/uL Final   Lymphocytes Relative 07/11/2021 27  % Final   Lymphs Abs 07/11/2021 1.1  0.7 - 4.0 K/uL Final   Monocytes Relative 07/11/2021 9  % Final   Monocytes Absolute 07/11/2021 0.4  0.1 - 1.0 K/uL Final   Eosinophils Relative 07/11/2021 3  % Final   Eosinophils Absolute 07/11/2021 0.1  0.0 - 0.5 K/uL Final   Basophils Relative 07/11/2021 1  % Final   Basophils Absolute 07/11/2021 0.1  0.0 - 0.1 K/uL Final   Immature Granulocytes 07/11/2021 0  % Final   Abs Immature Granulocytes 07/11/2021 0.01  0.00 - 0.07 K/uL Final   Performed at [facility], [address] [place], [place] [address]  Admission on 07/07/2021, Discharged on 07/08/2021  Component Date Value Ref Range Status   Alcohol, Ethyl (B) 07/07/2021 352 (A) <10 mg/dL Final   Comment: CRITICAL RESULT CALLED TO, READ BACK BY AND VERIFIED WITH: RSherrilee Gilles, RN. @2341  [id number] BLANKENSHIP R.  (NOTE) Lowest detectable limit for serum alcohol is 10 mg/dL.  For medical purposes only. Performed at [facility], [address], [place], [place] [address]    Sodium 07/07/2021 138  135 - 145 mmol/L Final   Potassium 07/07/2021 3.5  3.5 - 5.1 mmol/L Final   Chloride 07/07/2021 102  98 - 111 mmol/L Final   CO2 07/07/2021 24  22 - 32 mmol/L Final    Glucose, Bld 07/07/2021 108 (A) 70 - 99 mg/dL Final   Glucose reference range applies only to samples taken after fasting for at least 8 hours.   BUN 07/07/2021 <5 (A) 6 - 20 mg/dL Final   Creatinine, Ser 07/07/2021 0.63  0.61 - 1.24 mg/dL Final   Calcium 47/82/9562 8.8 (A) 8.9 - 10.3 mg/dL Final   Total Protein 13/06/6577 7.0  6.5 - 8.1 g/dL Final   Albumin 46/96/2952 3.5  3.5 - 5.0 g/dL Final   AST 84/13/2440 69 (A) 15 - 41 U/L Final   ALT 07/07/2021 48 (A) 0 - 44 U/L Final   Alkaline Phosphatase 07/07/2021 68  38 - 126 U/L Final   Total Bilirubin 07/07/2021 0.7  0.3 - 1.2 mg/dL Final   GFR, Estimated 07/07/2021 >60  >60 mL/min Final   Comment: (NOTE) Calculated using the CKD-EPI Creatinine Equation (2021)    Anion gap 07/07/2021 12  5 - 15 Final   Performed at [facility], [address] [place], [place] [address]   Lipase 07/07/2021 54 (A) 11 - 51 U/L Final   Performed at [facility], [address] [place], [place] [address]   WBC 07/07/2021 4.3  4.0 - 10.5 K/uL Final   RBC 07/07/2021 3.58 (A) 4.22 - 5.81 MIL/uL Final   Hemoglobin 07/07/2021 12.0 (A) 13.0 - 17.0 g/dL Final   HCT 40/34/7425 34.4 (A) 39.0 - 52.0 % Final   MCV 07/07/2021 96.1  80.0 - 100.0 fL Final   MCH 07/07/2021 33.5  26.0 - 34.0 pg Final   MCHC 07/07/2021 34.9  30.0 - 36.0 g/dL Final   RDW 95/63/8756 14.6  11.5 - 15.5 % Final   Platelets 07/07/2021 134 (A) 150 - 400 K/uL Final   nRBC 07/07/2021 0.0  0.0 - 0.2 % Final   Neutrophils  Relative % 07/07/2021 44  % Final   Neutro Abs 07/07/2021 1.9  1.7 - 7.7 K/uL Final   Lymphocytes Relative 07/07/2021 44  % Final   Lymphs Abs 07/07/2021 1.9  0.7 - 4.0 K/uL Final   Monocytes Relative 07/07/2021 9  % Final   Monocytes Absolute 07/07/2021 0.4  0.1 - 1.0 K/uL Final   Eosinophils Relative 07/07/2021 2  % Final   Eosinophils Absolute 07/07/2021 0.1  0.0 - 0.5 K/uL Final   Basophils Relative 07/07/2021 1  % Final   Basophils Absolute 07/07/2021 0.0   0.0 - 0.1 K/uL Final   Immature Granulocytes 07/07/2021 0  % Final   Abs Immature Granulocytes 07/07/2021 0.01  0.00 - 0.07 K/uL Final   Performed at [facility], [address], [place], [place] [address]   Opiates 07/07/2021 NONE DETECTED  NONE DETECTED Final   Cocaine 07/07/2021 NONE DETECTED  NONE DETECTED Final   Benzodiazepines 07/07/2021 NONE DETECTED  NONE DETECTED Final   Amphetamines 07/07/2021 POSITIVE (A) NONE DETECTED Final   Tetrahydrocannabinol 07/07/2021 NONE DETECTED  NONE DETECTED Final   Barbiturates 07/07/2021 NONE DETECTED  NONE DETECTED Final   Comment: (NOTE) DRUG SCREEN FOR MEDICAL PURPOSES ONLY.  IF CONFIRMATION IS NEEDED FOR ANY PURPOSE, NOTIFY LAB WITHIN 5 DAYS.  LOWEST DETECTABLE LIMITS FOR URINE DRUG SCREEN Drug Class                     Cutoff (ng/mL) Amphetamine and metabolites    1000 Barbiturate and metabolites    200 Benzodiazepine                 200 Tricyclics and metabolites     300 Opiates and metabolites        300 Cocaine and metabolites        300 THC                            50 Performed at [facility], [address], [place], [place] [address]    Color, Urine 07/07/2021 AMBER (A) YELLOW Final   BIOCHEMICALS MAY BE AFFECTED BY COLOR   APPearance 07/07/2021 HAZY (A) CLEAR Final   Specific Gravity, Urine 07/07/2021 1.021  1.005 - 1.030 Final   pH 07/07/2021 6.0  5.0 - 8.0 Final   Glucose, UA 07/07/2021 NEGATIVE  NEGATIVE mg/dL Final   Hgb urine dipstick 07/07/2021 SMALL (A) NEGATIVE Final   Bilirubin Urine 07/07/2021 NEGATIVE  NEGATIVE Final   Ketones, ur 07/07/2021 NEGATIVE  NEGATIVE mg/dL Final   Protein, ur 57/84/6962 30 (A) NEGATIVE mg/dL Final   Nitrite 95/28/4132 NEGATIVE  NEGATIVE Final   Leukocytes,Ua 07/07/2021 NEGATIVE  NEGATIVE Final   RBC / HPF 07/07/2021 0-5  0 - 5 RBC/hpf Final   WBC, UA 07/07/2021 0-5  0 - 5 WBC/hpf Final   Bacteria, UA 07/07/2021 RARE (A) NONE SEEN Final   Squamous Epithelial / LPF  07/07/2021 0-5  0 - 5 Final   Mucus 07/07/2021 PRESENT   Final   Hyaline Casts, UA 07/07/2021 PRESENT   Final   Performed at [facility], [address] [place], [place] [address]  Admission on 07/03/2021, Discharged on 07/03/2021  Component Date Value Ref Range Status   Sodium 07/03/2021 141  135 - 145 mmol/L Final   Potassium 07/03/2021 3.7  3.5 - 5.1 mmol/L Final   Chloride 07/03/2021 107  98 - 111 mmol/L  Final   CO2 07/03/2021 24  22 - 32 mmol/L Final   Glucose, Bld 07/03/2021 100 (A) 70 - 99 mg/dL Final   Glucose reference range applies only to samples taken after fasting for at least 8 hours.   BUN 07/03/2021 5 (A) 6 - 20 mg/dL Final   Creatinine, Ser 07/03/2021 0.62  0.61 - 1.24 mg/dL Final   Calcium 16/08/9603 8.5 (A) 8.9 - 10.3 mg/dL Final   GFR, Estimated 07/03/2021 >60  >60 mL/min Final   Comment: (NOTE) Calculated using the CKD-EPI Creatinine Equation (2021)    Anion gap 07/03/2021 10  5 - 15 Final   Performed at [facility], [address], [place], [place] [address]   WBC 07/03/2021 5.1  4.0 - 10.5 K/uL Final   RBC 07/03/2021 4.03 (A) 4.22 - 5.81 MIL/uL Final   Hemoglobin 07/03/2021 13.2  13.0 - 17.0 g/dL Final   HCT 11/91/4782 38.2 (A) 39.0 - 52.0 % Final   MCV 07/03/2021 94.8  80.0 - 100.0 fL Final   MCH 07/03/2021 32.8  26.0 - 34.0 pg Final   MCHC 07/03/2021 34.6  30.0 - 36.0 g/dL Final   RDW 95/62/1308 13.2  11.5 - 15.5 % Final   Platelets 07/03/2021 111 (A) 150 - 400 K/uL Final   Comment: Immature Platelet Fraction may be clinically indicated, consider ordering this additional test [id number] REPEATED TO VERIFY PLATELET COUNT CONFIRMED BY SMEAR    nRBC 07/03/2021 0.0  0.0 - 0.2 % Final   Performed at [facility], [address] [place], [place] [address]   Troponin I (High Sensitivity) 07/03/2021 6  <18 ng/L Final   Comment: (NOTE) Elevated high sensitivity troponin I (hsTnI) values and significant  changes across serial measurements  may suggest ACS but many other  chronic and acute conditions are known to elevate hsTnI results.  Refer to the "Links" section for chest pain algorithms and additional  guidance. Performed at [facility], [address], [place], [place] [address]    Alcohol, Ethyl (B) 07/03/2021 134 (A) <10 mg/dL Final   Comment: (NOTE) Lowest detectable limit for serum alcohol is 10 mg/dL.  For medical purposes only. Performed at [facility], [address] [place], [place] [address]    Opiates 07/03/2021 NONE DETECTED  NONE DETECTED Final   Cocaine 07/03/2021 NONE DETECTED  NONE DETECTED Final   Benzodiazepines 07/03/2021 NONE DETECTED  NONE DETECTED Final   Amphetamines 07/03/2021 NONE DETECTED  NONE DETECTED Final   Tetrahydrocannabinol 07/03/2021 NONE DETECTED  NONE DETECTED Final   Barbiturates 07/03/2021 NONE DETECTED  NONE DETECTED Final   Comment: (NOTE) DRUG SCREEN FOR MEDICAL PURPOSES ONLY.  IF CONFIRMATION IS NEEDED FOR ANY PURPOSE, NOTIFY LAB WITHIN 5 DAYS.  LOWEST DETECTABLE LIMITS FOR URINE DRUG SCREEN Drug Class                     Cutoff (ng/mL) Amphetamine and metabolites    1000 Barbiturate and metabolites    200 Benzodiazepine                 200 Tricyclics and metabolites     300 Opiates and metabolites        300 Cocaine and metabolites        300 THC                            50 Performed at  [facility] Lab, [address] [place], [place], [place] [address]   Admission on 06/30/2021, Discharged on 07/01/2021  Component Date Value Ref Range Status   SARS Coronavirus 2 by RT PCR 06/30/2021 NEGATIVE  NEGATIVE Final   Comment: (NOTE) SARS-CoV-2 target nucleic acids are NOT DETECTED.  The SARS-CoV-2 RNA is generally detectable in upper respiratory specimens during the acute phase of infection. The lowest concentration of SARS-CoV-2 viral copies this assay can detect is 138 copies/mL. A negative result does not preclude SARS-Cov-2 infection and should  not be used as the sole basis for treatment or other patient management decisions. A negative result may occur with  improper specimen collection/handling, submission of specimen other than nasopharyngeal swab, presence of viral mutation(s) within the areas targeted by this assay, and inadequate number of viral copies(<138 copies/mL). A negative result must be combined with clinical observations, patient history, and epidemiological information. The expected result is Negative.  Fact Sheet for Patients:  BloggerCourse.com  Fact Sheet for Healthcare Providers:  SeriousBroker.it  This test is no                          t yet approved or cleared by the [place] FDA and  has been authorized for detection and/or diagnosis of SARS-CoV-2 by FDA under an Emergency Use Authorization (EUA). This EUA will remain  in effect (meaning this test can be used) for the duration of the COVID-19 declaration under Section 564(b)(1) of the Act, 21 U.S.C.section 360bbb-3(b)(1), unless the authorization is terminated  or revoked sooner.       Influenza A by PCR 06/30/2021 NEGATIVE  NEGATIVE Final   Influenza B by PCR 06/30/2021 NEGATIVE  NEGATIVE Final   Comment: (NOTE) The Xpert Xpress SARS-CoV-2/FLU/RSV plus assay is intended as an aid in the diagnosis of influenza from Nasopharyngeal swab specimens and should not be used as a sole basis for treatment. Nasal washings and aspirates are unacceptable for Xpert Xpress SARS-CoV-2/FLU/RSV testing.  Fact Sheet for Patients: BloggerCourse.com  Fact Sheet for Healthcare Providers: SeriousBroker.it  This test is not yet approved or cleared by the [place] FDA and has been authorized for detection and/or diagnosis of SARS-CoV-2 by FDA under an Emergency Use Authorization (EUA). This EUA will remain in effect (meaning this test can be used) for the  duration of the COVID-19 declaration under Section 564(b)(1) of the Act, 21 U.S.C. section 360bbb-3(b)(1), unless the authorization is terminated or revoked.  Performed at [facility], [address], [place], [place] [address]    Sodium 06/30/2021 138  135 - 145 mmol/L Final   Potassium 06/30/2021 3.3 (A) 3.5 - 5.1 mmol/L Final   Chloride 06/30/2021 104  98 - 111 mmol/L Final   CO2 06/30/2021 22  22 - 32 mmol/L Final   Glucose, Bld 06/30/2021 109 (A) 70 - 99 mg/dL Final   Glucose reference range applies only to samples taken after fasting for at least 8 hours.   BUN 06/30/2021 5 (A) 6 - 20 mg/dL Final   Creatinine, Ser 06/30/2021 0.75  0.61 - 1.24 mg/dL Final   Calcium 13/24/4010 9.0  8.9 - 10.3 mg/dL Final   Total Protein 27/25/3664 7.5  6.5 - 8.1 g/dL Final   Albumin 40/34/7425 4.1  3.5 - 5.0 g/dL Final   AST 95/63/8756 49 (A) 15 - 41 U/L Final   ALT 06/30/2021 35  0 - 44 U/L Final   Alkaline Phosphatase 06/30/2021 74  38 - 126 U/L Final   Total Bilirubin 06/30/2021 0.5  0.3 - 1.2 mg/dL Final   GFR, Estimated 06/30/2021 >60  >60 mL/min Final   Comment: (NOTE) Calculated using the CKD-EPI Creatinine Equation (2021)    Anion gap 06/30/2021 12  5 - 15 Final   Performed at [facility] Lab, [address], [place], [place] [address]   Alcohol, Ethyl (B) 06/30/2021 381 (A) <10 mg/dL Final   Comment: CRITICAL RESULT CALLED TO, READ BACK BY AND VERIFIED WITH: L SHULAR RN BY SSTEPHENS [phone] (NOTE) Lowest detectable limit for serum alcohol is 10 mg/dL.  For medical purposes only. Performed at [facility], [address], [place], [place] [address]    WBC 06/30/2021 4.4  4.0 - 10.5 K/uL Final   RBC 06/30/2021 4.15 (A) 4.22 - 5.81 MIL/uL Final   Hemoglobin 06/30/2021 13.4  13.0 - 17.0 g/dL Final   HCT 69/62/9528 40.1  39.0 - 52.0 % Final   MCV 06/30/2021 96.6  80.0 - 100.0 fL Final   MCH 06/30/2021 32.3  26.0 - 34.0 pg Final   MCHC 06/30/2021 33.4  30.0 -  36.0 g/dL Final   RDW 41/32/4401 12.9  11.5 - 15.5 % Final   Platelets 06/30/2021 132 (A) 150 - 400 K/uL Final   nRBC 06/30/2021 0.0  0.0 - 0.2 % Final   Neutrophils Relative % 06/30/2021 47  % Final   Neutro Abs 06/30/2021 2.0  1.7 - 7.7 K/uL Final   Lymphocytes Relative 06/30/2021 44  % Final   Lymphs Abs 06/30/2021 1.9  0.7 - 4.0 K/uL Final   Monocytes Relative 06/30/2021 5  % Final   Monocytes Absolute 06/30/2021 0.2  0.1 - 1.0 K/uL Final   Eosinophils Relative 06/30/2021 3  % Final   Eosinophils Absolute 06/30/2021 0.1  0.0 - 0.5 K/uL Final   Basophils Relative 06/30/2021 1  % Final   Basophils Absolute 06/30/2021 0.0  0.0 - 0.1 K/uL Final   Immature Granulocytes 06/30/2021 0  % Final   Abs Immature Granulocytes 06/30/2021 0.01  0.00 - 0.07 K/uL Final   Performed at [facility], [address], [place], [place] [address]   Acetaminophen (Tylenol), Serum 06/30/2021 <10 (A) 10 - 30 ug/mL Final   Comment: (NOTE) Therapeutic concentrations vary significantly. A range of 10-30 ug/mL  may be an effective concentration for many patients. However, some  are best treated at concentrations outside of this range. Acetaminophen concentrations >150 ug/mL at 4 hours after ingestion  and >50 ug/mL at 12 hours after ingestion are often associated with  toxic reactions.  Performed at [facility], [address] [place], [place] [address]    Salicylate Lvl 06/30/2021 <7.0 (A) 7.0 - 30.0 mg/dL Final   Performed at [facility], [address], [place], [place] [address]   Total CK 06/30/2021 820 (A) 49 - 397 U/L Final   Performed at [facility], [address] [place], [place] [address]  There may be more visits with results that are not included.    Allergies: Tegretol [carbamazepine] and Caffeine  PTA Medications: (Not in a hospital admission)   Medical Decision Making   35 year old male with history of substance use, depression, HIV, who presented to  [facility] ED on 10/17 for SI, patient was found attempting to jump into traffic and punching traffic signs.  Patient reported he was suicidal with a plan to stab himself at  that time.  Patient reported drinking 6-7 bottles of wine a day last drink being prior to presentation.  It was assessed by TTS.  Patient had reported that for the past 2 weeks he was drinking 6 fifths of vodka daily for the past 2 days he been drinking 6 bottles of wine.  Patient was initially, however patient has been calm.  Patient was medically cleared in the ED and was accepted to [facility] for further treatment on 10/18. Etoh 333 and UDS+BZD + on presentation. Patient denies SI/HI/AVH today and requests assistance with detox. Patient is [facility] for [facility] at t his time-patient agreeable to ativan taper.   Alcohol use disorder , severe SIMD -initiate ativan taper  -detox protocol -restart gabapentin 300 mg TID -thiamine and vitamin supplementation  HIV -restart biktarvy  GERD -restart home protonix 20 mg BID  Dispo: Ongoing.  Patient possibly interested in rehab.  Patient requesting assistance with housing.  Will consult with social work for assistance.    Recommendations  Based on my evaluation the patient does not appear to have an emergency medical condition.  Estella Husk, MD 08/26/21  5:18 PM

## 2021-08-27 DIAGNOSIS — Z21 Asymptomatic human immunodeficiency virus [HIV] infection status: Secondary | ICD-10-CM | POA: Diagnosis not present

## 2021-08-27 DIAGNOSIS — R45851 Suicidal ideations: Secondary | ICD-10-CM | POA: Diagnosis not present

## 2021-08-27 DIAGNOSIS — F1994 Other psychoactive substance use, unspecified with psychoactive substance-induced mood disorder: Secondary | ICD-10-CM | POA: Diagnosis not present

## 2021-08-27 DIAGNOSIS — F10230 Alcohol dependence with withdrawal, uncomplicated: Secondary | ICD-10-CM | POA: Diagnosis not present

## 2021-08-27 MED ORDER — TRAZODONE HCL 50 MG PO TABS
50.0000 mg | ORAL_TABLET | Freq: Once | ORAL | Status: AC
  Administered 2021-08-28: 50 mg via ORAL
  Filled 2021-08-27: qty 1

## 2021-08-27 NOTE — Clinical Social Work Psych Note (Signed)
Cognitive Distortions & Restructuring  Cognitive Distortions & Cognitive Restructuring (CBT & DBT Skills)  Date: 08/27/21  Type of Therapy/Therapeutic Modalities:  Participation Level: Active  Objective: The purpose of this group is to discuss and assist patients in identifying cognitive distortions (negative thinking patterns) that can influence their emotions and behaviors. Facilitators will guide conversations that discuss how these cognitive distortions contribute to common disorders such as anxiety and depression.   Therapeutic Goals:  Patient will identify negative thinking patterns they currently experience and how those thoughts influence their behaviors.  Patient will begin to explore the possible misinformation and/or traumatic experiences that influence their negative thought patterns.  Patient will explore the foundations and techniques of cognitive restructuring by reviewing CBT and DBT techniques.  Patient will discuss the   Summary of Patient's Progress:  Jaymond was engaged and participated throughout the group session. Bingham identified personal struggles with specific cognitive distortions. Niles reviewed the CBT model and how cognitive distortions have an impact on our behaviors. [place] completed worksheet challenging patients on the objective and therapeutic goals listed above.

## 2021-08-27 NOTE — Progress Notes (Signed)
Pt's CIWA was a 4 °

## 2021-08-27 NOTE — Group Note (Signed)
Group Topic: Wellness  Group Date: 08/27/2021 Start Time: 1000 End Time: 1100 Facilitators: Levander Campion  Department: [facility]  Number of Participants: 5  Group Focus: personal responsibility Treatment Modality:  Behavior Modification Therapy Interventions utilized were assignment, patient education, and problem solving Purpose: enhance coping skills and reinforce self-care  Name: Gary [name] Date of Birth: [birth year]  MR: [id number]    Level of Participation: active Quality of Participation: attentive Interactions with others: gave feedback Mood/Affect: appropriate Triggers (if applicable): n/a Cognition: coherent/clear Progress: Moderate Response: n/a Plan: follow-up needed  Patients Problems:  Patient Active Problem List   Diagnosis Date Noted   Constipation 07/30/2021   Alcohol-induced insomnia (HCC)    MDD (major depressive disorder), recurrent episode, 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/26/2021   Neutropenia (HCC) 07/26/2021   Alcohol withdrawal seizure without complication (HCC)    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   Elevated troponin 10/19/2020   Alcohol withdrawal (HCC) 10/19/2020   Alcohol abuse    Suicidal ideation 06/14/2020   Pancreatitis 06/13/2020   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   Malnutrition of moderate degree 05/07/2020   Gastrointestinal hemorrhage    Alcoholic hepatitis without ascites    Melena 05/05/2020   Alcoholic ketoacidosis 05/05/2020   Thrombocytopenia (HCC) 07/25/2019   Transaminitis 07/25/2019   Traumatic subdural hematoma 07/02/2019   Alcohol abuse with intoxication (HCC) 07/02/2019   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

## 2021-08-27 NOTE — Progress Notes (Signed)
Pt is presently asleep. Pt complained of neuropathic pain in fingers and feet. No signs of acute distress noted. Administered scheduled meds with no incident. Pt denies current SI/HI/AVH. Staff will monitor for pt's safety.

## 2021-08-27 NOTE — Clinical Social Work Psych Note (Signed)
CSW Initial Note   CSW met with Gary [name] for introduction and to begin discussions regarding potential discharge planning. Gary [name] was pleasant and cooperative with providing information. He presented with a flat affect, congruent mood.   Gary [name] reports that he came to the hospital seeking treatment for detox from ETOH. Gary [name] denies having any SI, HI or AVH. He endorse drinking about six, fifths of Vodka on a daily basis for two weeks. He also shared that for the last two days, he drunk six bottles of wine per day.    Gary [name] declined any outpatient services at this time. CSW attempted to explain the importance of continuity of care/ follow up. Gary [name] expressed understanding, however continued to decline any referrals/follow up services.   Gary [name] reports "I just need to stop hanging with my drinking friends".  Gary [name] is currently homeless, however he reports staying with various friends with his partner. Gary [name] states that his main goal is to "get back to work".    CSW will continue to follow    Radonna Ricker, MSW, LCSW Clinical Social Worker ([place]) [facility]

## 2021-08-27 NOTE — ED Notes (Signed)
PT attended wrap up group

## 2021-08-27 NOTE — Progress Notes (Signed)
Pt is presently resting. No distress noted. Pt took the dressing off his abrasion when he had shower. Pt got agitated when this writer informed him that it needs to be covered to prevent infection and cross contamination due to incidence of bleeding. Bandage was applied. Monitoring for pt's safety.

## 2021-08-27 NOTE — ED Notes (Signed)
PT in dining room eating a snack and watching television.

## 2021-08-27 NOTE — ED Notes (Signed)
Pt asleep in bed. Respirations even and unlabored. Will continue to monitor for safety. ?

## 2021-08-27 NOTE — ED Notes (Signed)
PT came to nurses station stating he feels very anxious. This nurse did a CIWA on patient and his score came up to an 8. This nurse advised the patient that he has an order for vistaril to help with his anxiety. This nurse asked the patient to come to the medication window with water to receive his medication . Once patient got to the window he stated he did not want the vistaril and stated he wanted trazodone instead. This nurse advised the patient that he does not have an order at this time for trazodone. The patient then stated " I am very fucking pissed" and that he would file a complaint if he is not able to get sleep. This nurse reached out to the provider on staff to see if an order can be put in for trazodone for the patient.

## 2021-08-27 NOTE — Progress Notes (Signed)
Pt's CIWA was 6. °

## 2021-08-27 NOTE — ED Provider Notes (Signed)
Behavioral Health Progress Note  Date and Time: 08/27/2021 9:42 AM Name: Gary [name] MRN:  [id number]  Subjective:    Patient seen and chart reviewed-he has been medication compliant, he has been appropriate with peers and staff on the unit, and has been attending groups.  Most recent CIWA score of 6. VSS.  Patient currently denies the following alcohol withdrawal symptoms: vomiting, sweating, headaches, anxiety, palpitations, anorexia, GI upset. He reports "a hair" of nausea but states that he was able to eat without issue and that it has be relieved with  medications.  Patient describes his mood as "pretty good".  Patient states that he slept well and has had no issues with appetite.  Patient denies SI/HI/AVH.  Patient states that he has been attending groups and that he has found them to be helpful.  Discussed options for substance use treatment-including residential treatment, outpatient treatment, and intensive outpatient substance use treatment-patient declines all substance use treatment at this time.  Patient states that he will follow up with his peers support specialist and will attend AA meetings upon discharge.  Patient asks if he would be able to leave tomorrow-discussed with patient that it is recommended that he remain at the [facility] until his Ativan taper is completed since he has had a history of withdrawal seizures and delirium tremens in the past.  Patient verbalizes understanding and is agreeable to stay at this time.    Diagnosis:  Final diagnoses:  Substance induced mood disorder (HCC)  Alcohol withdrawal syndrome without complication (HCC)  Alcohol use disorder, severe, dependence (HCC)    Total Time spent with patient: 15 minutes  Past Psychiatric History: see H&P Past Medical History:  Past Medical History:  Diagnosis Date   Alcohol abuse    HIV (human immunodeficiency virus infection) (HCC)    Seizures (HCC)    Subdural hematoma     Past Surgical History:   Procedure Laterality Date   INCISION AND DRAINAGE PERIRECTAL ABSCESS N/A 09/24/2016   Procedure: IRRIGATION AND DEBRIDEMENT PERIRECTAL ABSCESS;  Surgeon: Ricarda Frame, MD;  Location: [facility] ORS;  Service: General;  Laterality: N/A;   none     Family History:  Family History  Problem Relation Age of Onset   Alcohol abuse Mother    Alcohol abuse Father    Family Psychiatric  History: see H&p Social History:  Social History   Substance and Sexual Activity  Alcohol Use Yes   Comment: "I drink all I can"     Social History   Substance and Sexual Activity  Drug Use Yes   Types: Methamphetamines, Marijuana   Comment: last used 04/10/2019    Social History   Socioeconomic History   Marital status: Single    Spouse name: Not on file   Number of children: Not on file   Years of education: Not on file   Highest education level: Not on file  Occupational History   Occupation: unemployed  Tobacco Use   Smoking status: Every Day    Packs/day: 0.50    Types: Cigarettes   Smokeless tobacco: Never   Tobacco comments:    unable to smoke while incarcerated 6+ months 02/13/20  Vaping Use   Vaping Use: Never used  Substance and Sexual Activity   Alcohol use: Yes    Comment: "I drink all I can"   Drug use: Yes    Types: Methamphetamines, Marijuana    Comment: last used 04/10/2019   Sexual activity: Yes    Partners: Male, Male  Comment: declined condoms  03/2021  Other Topics Concern   Not on file  Social History Narrative   "Currently living on the streets"   Independent at baseline   Social Determinants of Health   Financial Resource Strain: Not on file  Food Insecurity: Not on file  Transportation Needs: Not on file  Physical Activity: Not on file  Stress: Not on file  Social Connections: Not on file   SDOH:  SDOH Screenings   Alcohol Screen: Medium Risk   Last Alcohol Screening Score (AUDIT): 25  Depression (PHQ2-9): Medium Risk   PHQ-2 Score: 23  Financial  Resource Strain: Not on file  Food Insecurity: Not on file  Housing: Not on file  Physical Activity: Not on file  Social Connections: Not on file  Stress: Not on file  Tobacco Use: High Risk   Smoking Tobacco Use: Every Day   Smokeless Tobacco Use: Never  Transportation Needs: Not on file               Sleep: Good  Appetite:  Good  Current Medications:  Current Facility-Administered Medications  Medication Dose Route Frequency Provider Last Rate Last Admin   acetaminophen (TYLENOL) tablet 650 mg  650 mg Oral Q6H PRN Estella Husk, MD       alum & mag hydroxide-simeth (MAALOX/MYLANTA) 200-200-20 MG/5ML suspension 30 mL  30 mL Oral Q4H PRN Estella Husk, MD       bictegravir-emtricitabine-tenofovir AF (BIKTARVY) 50-200-25 MG per tablet 1 tablet  1 tablet Oral Daily Estella Husk, MD   1 tablet at 08/27/21 2130   gabapentin (NEURONTIN) capsule 300 mg  300 mg Oral TID Estella Husk, MD   300 mg at 08/27/21 8657   hydrOXYzine (ATARAX/VISTARIL) tablet 25 mg  25 mg Oral TID PRN Estella Husk, MD       hydrOXYzine (ATARAX/VISTARIL) tablet 25 mg  25 mg Oral Q6H PRN Estella Husk, MD       loperamide (IMODIUM) capsule 2-4 mg  2-4 mg Oral PRN Estella Husk, MD       LORazepam (ATIVAN) tablet 1 mg  1 mg Oral Q6H PRN Estella Husk, MD       LORazepam (ATIVAN) tablet 1 mg  1 mg Oral QID Estella Husk, MD   1 mg at 08/27/21 8469   Followed by   Melene Muller ON 08/28/2021] LORazepam (ATIVAN) tablet 1 mg  1 mg Oral TID Estella Husk, MD       Followed by   Melene Muller ON 08/29/2021] LORazepam (ATIVAN) tablet 1 mg  1 mg Oral BID Estella Husk, MD       Followed by   Melene Muller ON 08/30/2021] LORazepam (ATIVAN) tablet 1 mg  1 mg Oral Daily Estella Husk, MD       magnesium hydroxide (MILK OF MAGNESIA) suspension 30 mL  30 mL Oral Daily PRN Estella Husk, MD       multivitamin with minerals tablet 1 tablet  1 tablet Oral  Daily Estella Husk, MD   1 tablet at 08/27/21 0903   pantoprazole (PROTONIX) EC tablet 20 mg  20 mg Oral BID Estella Husk, MD   20 mg at 08/27/21 0903   promethazine (PHENERGAN) tablet 25 mg  25 mg Oral Q6H PRN Estella Husk, MD       thiamine tablet 100 mg  100 mg Oral Daily Estella Husk, MD   100 mg at 08/27/21 [phone]  traZODone (DESYREL) tablet 50 mg  50 mg Oral QHS PRN Estella Husk, MD   50 mg at 08/26/21 2201   Current Outpatient Medications  Medication Sig Dispense Refill   benztropine (COGENTIN) 1 MG tablet Take 1 tablet (1 mg total) by mouth 2 (two) times daily. (Patient not taking: Reported on 08/26/2021) 60 tablet 0   bictegravir-emtricitabine-tenofovir AF (BIKTARVY) 50-200-25 MG TABS tablet Take 1 tablet by mouth daily after lunch. 30 tablet 0   doxepin (SINEQUAN) 10 MG capsule Take 1 capsule (10 mg total) by mouth at bedtime. (Patient not taking: Reported on 08/26/2021) 30 capsule 0   folic acid (FOLVITE) 1 MG tablet Take 1 tablet (1 mg total) by mouth daily. (Patient not taking: Reported on 08/26/2021)     gabapentin (NEURONTIN) 300 MG capsule Take 1 capsule (300 mg total) by mouth 3 (three) times daily. (Patient not taking: Reported on 08/26/2021) 90 capsule 2   Multiple Vitamin (MULTIVITAMIN WITH MINERALS) TABS tablet Take 1 tablet by mouth daily. (Patient not taking: Reported on 08/26/2021)     pantoprazole (PROTONIX) 20 MG tablet Take 1 tablet (20 mg total) by mouth 2 (two) times daily. (Patient not taking: Reported on 08/26/2021) 30 tablet 0   thiamine 100 MG tablet Take 1 tablet (100 mg total) by mouth daily. (Patient not taking: Reported on 08/26/2021) 30 tablet 0    Labs  Lab Results:  Admission on 08/25/2021, Discharged on 08/26/2021  Component Date Value Ref Range Status   WBC 08/26/2021 3.9 (A) 4.0 - 10.5 K/uL Final   RBC 08/26/2021 4.45  4.22 - 5.81 MIL/uL Final   Hemoglobin 08/26/2021 15.1  13.0 - 17.0 g/dL Final   HCT  56/21/3086 44.0  39.0 - 52.0 % Final   MCV 08/26/2021 98.9  80.0 - 100.0 fL Final   MCH 08/26/2021 33.9  26.0 - 34.0 pg Final   MCHC 08/26/2021 34.3  30.0 - 36.0 g/dL Final   RDW 57/84/6962 16.2 (A) 11.5 - 15.5 % Final   Platelets 08/26/2021 143 (A) 150 - 400 K/uL Final   nRBC 08/26/2021 0.0  0.0 - 0.2 % Final   Neutrophils Relative % 08/26/2021 35  % Final   Neutro Abs 08/26/2021 1.4 (A) 1.7 - 7.7 K/uL Final   Lymphocytes Relative 08/26/2021 50  % Final   Lymphs Abs 08/26/2021 1.9  0.7 - 4.0 K/uL Final   Monocytes Relative 08/26/2021 11  % Final   Monocytes Absolute 08/26/2021 0.4  0.1 - 1.0 K/uL Final   Eosinophils Relative 08/26/2021 3  % Final   Eosinophils Absolute 08/26/2021 0.1  0.0 - 0.5 K/uL Final   Basophils Relative 08/26/2021 1  % Final   Basophils Absolute 08/26/2021 0.0  0.0 - 0.1 K/uL Final   Immature Granulocytes 08/26/2021 0  % Final   Abs Immature Granulocytes 08/26/2021 0.00  0.00 - 0.07 K/uL Final   Performed at [facility], [address], [place], [place] [address]   Alcohol, Ethyl (B) 08/26/2021 333 (A) <10 mg/dL Final   Comment: CRITICAL RESULT CALLED TO, READ BACK BY AND VERIFIED WITH: MERRICKS LOU RN 08/26/21 @ 0308 VS (NOTE) Lowest detectable limit for serum alcohol is 10 mg/dL.  For medical purposes only. Performed at [facility], [address], [place], [place] [address]    Salicylate Lvl 08/26/2021 <7.0 (A) 7.0 - 30.0 mg/dL Final   Performed at [facility], [address] [place], [place] [address]   Acetaminophen (Tylenol),  Serum 08/26/2021 1 (A) 10 - 30 ug/mL Final   Performed at [facility], [address] [place], [place] [address]   Sodium 08/26/2021 138  135 - 145 mmol/L Final   Potassium 08/26/2021 3.7  3.5 - 5.1 mmol/L Final   Chloride 08/26/2021 101  98 - 111 mmol/L Final   CO2 08/26/2021 26  22 - 32 mmol/L Final   Glucose, Bld 08/26/2021 99  70 - 99 mg/dL  Final   Glucose reference range applies only to samples taken after fasting for at least 8 hours.   BUN 08/26/2021 6  6 - 20 mg/dL Final   Creatinine, Ser 08/26/2021 0.65  0.61 - 1.24 mg/dL Final   Calcium 60/45/4098 9.3  8.9 - 10.3 mg/dL Final   Total Protein 11/91/4782 8.0  6.5 - 8.1 g/dL Final   Albumin 95/62/1308 4.8  3.5 - 5.0 g/dL Final   AST 65/78/4696 56 (A) 15 - 41 U/L Final   ALT 08/26/2021 35  0 - 44 U/L Final   Alkaline Phosphatase 08/26/2021 56  38 - 126 U/L Final   Total Bilirubin 08/26/2021 0.5  0.3 - 1.2 mg/dL Final   GFR, Estimated 08/26/2021 >60  >60 mL/min Final   Comment: (NOTE) Calculated using the CKD-EPI Creatinine Equation (2021)    Anion gap 08/26/2021 11  5 - 15 Final   Performed at [facility], [address] [place], [place] [address]   Opiates 08/26/2021 NONE DETECTED  NONE DETECTED Final   Cocaine 08/26/2021 NONE DETECTED  NONE DETECTED Final   Benzodiazepines 08/26/2021 POSITIVE (A) NONE DETECTED Final   Amphetamines 08/26/2021 NONE DETECTED  NONE DETECTED Final   Tetrahydrocannabinol 08/26/2021 NONE DETECTED  NONE DETECTED Final   Barbiturates 08/26/2021 NONE DETECTED  NONE DETECTED Final   Comment: (NOTE) DRUG SCREEN FOR MEDICAL PURPOSES ONLY.  IF CONFIRMATION IS NEEDED FOR ANY PURPOSE, NOTIFY LAB WITHIN 5 DAYS.  LOWEST DETECTABLE LIMITS FOR URINE DRUG SCREEN Drug Class                     Cutoff (ng/mL) Amphetamine and metabolites    1000 Barbiturate and metabolites    200 Benzodiazepine                 200 Tricyclics and metabolites     300 Opiates and metabolites        300 Cocaine and metabolites        300 THC                            50 Performed at [facility], [address] [place], [place] [address]    SARS Coronavirus 2 by RT PCR 08/26/2021 NEGATIVE  NEGATIVE Final   Comment: (NOTE) SARS-CoV-2 target nucleic acids are NOT DETECTED.  The SARS-CoV-2 RNA is generally detectable in upper  respiratory specimens during the acute phase of infection. The lowest concentration of SARS-CoV-2 viral copies this assay can detect is 138 copies/mL. A negative result does not preclude SARS-Cov-2 infection and should not be used as the sole basis for treatment or other patient management decisions. A negative result may occur with  improper specimen collection/handling, submission of specimen other than nasopharyngeal swab, presence of viral mutation(s) within the areas targeted by this assay, and inadequate number of viral copies(<138 copies/mL). A negative result must be combined with clinical observations, patient history, and epidemiological information. The expected result  is Negative.  Fact Sheet for Patients:  BloggerCourse.com  Fact Sheet for Healthcare Providers:  SeriousBroker.it  This test is no                          t yet approved or cleared by the [place] FDA and  has been authorized for detection and/or diagnosis of SARS-CoV-2 by FDA under an Emergency Use Authorization (EUA). This EUA will remain  in effect (meaning this test can be used) for the duration of the COVID-19 declaration under Section 564(b)(1) of the Act, 21 U.S.C.section 360bbb-3(b)(1), unless the authorization is terminated  or revoked sooner.       Influenza A by PCR 08/26/2021 NEGATIVE  NEGATIVE Final   Influenza B by PCR 08/26/2021 NEGATIVE  NEGATIVE Final   Comment: (NOTE) The Xpert Xpress SARS-CoV-2/FLU/RSV plus assay is intended as an aid in the diagnosis of influenza from Nasopharyngeal swab specimens and should not be used as a sole basis for treatment. Nasal washings and aspirates are unacceptable for Xpert Xpress SARS-CoV-2/FLU/RSV testing.  Fact Sheet for Patients: BloggerCourse.com  Fact Sheet for Healthcare Providers: SeriousBroker.it  This test is not yet approved or  cleared by the [place] FDA and has been authorized for detection and/or diagnosis of SARS-CoV-2 by FDA under an Emergency Use Authorization (EUA). This EUA will remain in effect (meaning this test can be used) for the duration of the COVID-19 declaration under Section 564(b)(1) of the Act, 21 U.S.C. section 360bbb-3(b)(1), unless the authorization is terminated or revoked.  Performed at [facility], [address] [place], [place] [address]   Admission on 08/08/2021, Discharged on 08/09/2021  Component Date Value Ref Range Status   WBC 08/08/2021 4.7  4.0 - 10.5 K/uL Final   RBC 08/08/2021 4.29  4.22 - 5.81 MIL/uL Final   Hemoglobin 08/08/2021 14.5  13.0 - 17.0 g/dL Final   HCT 57/84/6962 43.0  39.0 - 52.0 % Final   MCV 08/08/2021 100.2 (A) 80.0 - 100.0 fL Final   MCH 08/08/2021 33.8  26.0 - 34.0 pg Final   MCHC 08/08/2021 33.7  30.0 - 36.0 g/dL Final   RDW 95/28/4132 16.9 (A) 11.5 - 15.5 % Final   Platelets 08/08/2021 460 (A) 150 - 400 K/uL Final   nRBC 08/08/2021 0.0  0.0 - 0.2 % Final   Neutrophils Relative % 08/08/2021 45  % Final   Neutro Abs 08/08/2021 2.1  1.7 - 7.7 K/uL Final   Lymphocytes Relative 08/08/2021 45  % Final   Lymphs Abs 08/08/2021 2.2  0.7 - 4.0 K/uL Final   Monocytes Relative 08/08/2021 6  % Final   Monocytes Absolute 08/08/2021 0.3  0.1 - 1.0 K/uL Final   Eosinophils Relative 08/08/2021 2  % Final   Eosinophils Absolute 08/08/2021 0.1  0.0 - 0.5 K/uL Final   Basophils Relative 08/08/2021 2  % Final   Basophils Absolute 08/08/2021 0.1  0.0 - 0.1 K/uL Final   Immature Granulocytes 08/08/2021 0  % Final   Abs Immature Granulocytes 08/08/2021 0.00  0.00 - 0.07 K/uL Final   Performed at [facility], [address] [place], [place] [address]   Sodium 08/08/2021 138  135 - 145 mmol/L Final   Potassium 08/08/2021 3.6  3.5 - 5.1 mmol/L Final   Chloride 08/08/2021 101  98 - 111 mmol/L Final   CO2 08/08/2021 19 (A) 22 - 32 mmol/L  Final   Glucose, Bld 08/08/2021  82  70 - 99 mg/dL Final   Glucose reference range applies only to samples taken after fasting for at least 8 hours.   BUN 08/08/2021 <5 (A) 6 - 20 mg/dL Final   Creatinine, Ser 08/08/2021 0.76  0.61 - 1.24 mg/dL Final   Calcium 40/08/2724 9.4  8.9 - 10.3 mg/dL Final   Total Protein 36/64/4034 7.8  6.5 - 8.1 g/dL Final   Albumin 74/25/9563 4.3  3.5 - 5.0 g/dL Final   AST 87/56/4332 46 (A) 15 - 41 U/L Final   ALT 08/08/2021 32  0 - 44 U/L Final   Alkaline Phosphatase 08/08/2021 68  38 - 126 U/L Final   Total Bilirubin 08/08/2021 0.7  0.3 - 1.2 mg/dL Final   GFR, Estimated 08/08/2021 >60  >60 mL/min Final   Comment: (NOTE) Calculated using the CKD-EPI Creatinine Equation (2021)    Anion gap 08/08/2021 18 (A) 5 - 15 Final   Performed at [facility], [address] [place], [place] [address]   Alcohol, Ethyl (B) 08/08/2021 281 (A) <10 mg/dL Final   Comment: (NOTE) Lowest detectable limit for serum alcohol is 10 mg/dL.  For medical purposes only. Performed at [facility], [address], [place], [place] [address]    Lipase 08/08/2021 53 (A) 11 - 51 U/L Final   Performed at [facility], [address] [place], [place] [address]   Color, Urine 08/08/2021 YELLOW  YELLOW Final   APPearance 08/08/2021 CLEAR  CLEAR Final   Specific Gravity, Urine 08/08/2021 1.009  1.005 - 1.030 Final   pH 08/08/2021 5.0  5.0 - 8.0 Final   Glucose, UA 08/08/2021 NEGATIVE  NEGATIVE mg/dL Final   Hgb urine dipstick 08/08/2021 NEGATIVE  NEGATIVE Final   Bilirubin Urine 08/08/2021 NEGATIVE  NEGATIVE Final   Ketones, ur 08/08/2021 20 (A) NEGATIVE mg/dL Final   Protein, ur 11/17/3233 NEGATIVE  NEGATIVE mg/dL Final   Nitrite 57/32/2025 NEGATIVE  NEGATIVE Final   Leukocytes,Ua 08/08/2021 NEGATIVE  NEGATIVE Final   Performed at [facility], [address] [place], [place] [address]  Admission on 08/05/2021, Discharged on 08/05/2021  Component Date  Value Ref Range Status   Glucose-Capillary 08/05/2021 73  70 - 99 mg/dL Final   Glucose reference range applies only to samples taken after fasting for at least 8 hours.  Admission on 07/26/2021, Discharged on 07/31/2021  Component Date Value Ref Range Status   WBC 07/26/2021 2.7 (A) 4.0 - 10.5 K/uL Final   RBC 07/26/2021 3.94 (A) 4.22 - 5.81 MIL/uL Final   Hemoglobin 07/26/2021 13.7  13.0 - 17.0 g/dL Final   HCT 23/76/2831 40.3  39.0 - 52.0 % Final   MCV 07/26/2021 102.3 (A) 80.0 - 100.0 fL Final   MCH 07/26/2021 34.8 (A) 26.0 - 34.0 pg Final   MCHC 07/26/2021 34.0  30.0 - 36.0 g/dL Final   RDW 51/76/1607 17.0 (A) 11.5 - 15.5 % Final   Platelets 07/26/2021 143 (A) 150 - 400 K/uL Final   Comment: Immature Platelet Fraction may be clinically indicated, consider ordering this additional test [id number] REPEATED TO VERIFY    nRBC 07/26/2021 0.0  0.0 - 0.2 % Final   Performed at [facility], [address], [place], [place] [address]   Sodium 07/26/2021 135  135 - 145 mmol/L Final   Potassium 07/26/2021 3.3 (A) 3.5 - 5.1 mmol/L Final   Chloride 07/26/2021 99  98 - 111 mmol/L Final  CO2 07/26/2021 28  22 - 32 mmol/L Final   Glucose, Bld 07/26/2021 94  70 - 99 mg/dL Final   Glucose reference range applies only to samples taken after fasting for at least 8 hours.   BUN 07/26/2021 8  6 - 20 mg/dL Final   Creatinine, Ser 07/26/2021 0.57 (A) 0.61 - 1.24 mg/dL Final   Calcium 96/29/5284 9.6  8.9 - 10.3 mg/dL Final   Total Protein 13/24/4010 7.4  6.5 - 8.1 g/dL Final   Albumin 27/25/3664 4.1  3.5 - 5.0 g/dL Final   AST 40/34/7425 34  15 - 41 U/L Final   ALT 07/26/2021 24  0 - 44 U/L Final   Alkaline Phosphatase 07/26/2021 75  38 - 126 U/L Final   Total Bilirubin 07/26/2021 1.7 (A) 0.3 - 1.2 mg/dL Final   GFR, Estimated 07/26/2021 >60  >60 mL/min Final   Comment: (NOTE) Calculated using the CKD-EPI Creatinine Equation (2021)    Anion gap 07/26/2021 8  5 - 15 Final    Performed at [facility], [address], [place], [place] [address]   Cholesterol 07/26/2021 198  0 - 200 mg/dL Final   Triglycerides 75/64/3329 209 (A) <150 mg/dL Final   HDL 51/88/4166 52  >40 mg/dL Final   Total CHOL/HDL Ratio 07/26/2021 3.8  RATIO Final   VLDL 07/26/2021 42 (A) 0 - 40 mg/dL Final   LDL Cholesterol 07/26/2021 104 (A) 0 - 99 mg/dL Final   Comment:        Total Cholesterol/HDL:CHD Risk Coronary Heart Disease Risk Table                     Men   Women  1/2 Average Risk   3.4   3.3  Average Risk       5.0   4.4  2 X Average Risk   9.6   7.1  3 X Average Risk  23.4   11.0        Use the calculated Patient Ratio above and the CHD Risk Table to determine the patient's CHD Risk.        ATP III CLASSIFICATION (LDL):  <100     mg/dL   Optimal  063-016  mg/dL   Near or Above                    Optimal  130-159  mg/dL   Borderline  010-932  mg/dL   High  >355     mg/dL   Very High Performed at [facility], [address] [place], [place] [address]    Total Protein 07/26/2021 7.5  6.5 - 8.1 g/dL Final   Albumin 25/42/7062 4.0  3.5 - 5.0 g/dL Final   AST 37/62/8315 35  15 - 41 U/L Final   ALT 07/26/2021 24  0 - 44 U/L Final   Alkaline Phosphatase 07/26/2021 75  38 - 126 U/L Final   Total Bilirubin 07/26/2021 1.7 (A) 0.3 - 1.2 mg/dL Final   Bilirubin, Direct 07/26/2021 0.3 (A) 0.0 - 0.2 mg/dL Final   Indirect Bilirubin 07/26/2021 1.4 (A) 0.3 - 0.9 mg/dL Final   Performed at [facility], [address], [name], [place] [address]   Hgb A1c MFr Bld 07/26/2021 4.3 (A) 4.8 - 5.6 % Final   Comment: (NOTE) Pre diabetes:          5.7%-6.4%  Diabetes:              >  6.4%  Glycemic control for   <7.0% adults with diabetes    Mean Plasma Glucose 07/26/2021 76.71  mg/dL Final   Performed at [facility], [address] [place], [place] [address]   WBC 07/26/2021 3.1 (A) 4.0 - 10.5 K/uL Final   RBC  07/26/2021 4.03 (A) 4.22 - 5.81 MIL/uL Final   Hemoglobin 07/26/2021 13.7  13.0 - 17.0 g/dL Final   HCT 29/56/2130 41.2  39.0 - 52.0 % Final   MCV 07/26/2021 102.2 (A) 80.0 - 100.0 fL Final   MCH 07/26/2021 34.0  26.0 - 34.0 pg Final   MCHC 07/26/2021 33.3  30.0 - 36.0 g/dL Final   RDW 86/57/8469 17.0 (A) 11.5 - 15.5 % Final   Platelets 07/26/2021 133 (A) 150 - 400 K/uL Final   nRBC 07/26/2021 0.0  0.0 - 0.2 % Final   Neutrophils Relative % 07/26/2021 68  % Final   Neutro Abs 07/26/2021 2.1  1.7 - 7.7 K/uL Final   Lymphocytes Relative 07/26/2021 22  % Final   Lymphs Abs 07/26/2021 0.7  0.7 - 4.0 K/uL Final   Monocytes Relative 07/26/2021 6  % Final   Monocytes Absolute 07/26/2021 0.2  0.1 - 1.0 K/uL Final   Eosinophils Relative 07/26/2021 3  % Final   Eosinophils Absolute 07/26/2021 0.1  0.0 - 0.5 K/uL Final   Basophils Relative 07/26/2021 1  % Final   Basophils Absolute 07/26/2021 0.0  0.0 - 0.1 K/uL Final   Immature Granulocytes 07/26/2021 0  % Final   Abs Immature Granulocytes 07/26/2021 0.01  0.00 - 0.07 K/uL Final   Performed at [facility], [address] [place], [place] [address]   Sodium 07/26/2021 138  135 - 145 mmol/L Final   Potassium 07/26/2021 3.5  3.5 - 5.1 mmol/L Final   Chloride 07/26/2021 101  98 - 111 mmol/L Final   CO2 07/26/2021 28  22 - 32 mmol/L Final   Glucose, Bld 07/26/2021 102 (A) 70 - 99 mg/dL Final   Glucose reference range applies only to samples taken after fasting for at least 8 hours.   BUN 07/26/2021 9  6 - 20 mg/dL Final   Creatinine, Ser 07/26/2021 0.72  0.61 - 1.24 mg/dL Final   Calcium 84/13/2440 9.9  8.9 - 10.3 mg/dL Final   Total Protein 09/05/2535 7.6  6.5 - 8.1 g/dL Final   Albumin 64/40/3474 4.3  3.5 - 5.0 g/dL Final   AST 25/95/6387 37  15 - 41 U/L Final   ALT 07/26/2021 23  0 - 44 U/L Final   Alkaline Phosphatase 07/26/2021 74  38 - 126 U/L Final   Total Bilirubin 07/26/2021 1.6 (A) 0.3 - 1.2 mg/dL Final   GFR,  Estimated 07/26/2021 >60  >60 mL/min Final   Comment: (NOTE) Calculated using the CKD-EPI Creatinine Equation (2021)    Anion gap 07/26/2021 9  5 - 15 Final   Performed at [facility], [address] [place], [place] [address]   Magnesium 07/26/2021 1.6 (A) 1.7 - 2.4 mg/dL Final   Performed at [facility], [address] [place], [place] [address]   HIV 1 RNA Quant 07/26/2021 WRORD  copies/mL Corrected   Comment: (NOTE) Test not performed. The required specimen for the test ordered was not received. Received frozen whole blood Requires frozen plasma 07/28/2021 The reportable range for this assay is 20 to 10,000,000 copies HIV-1 RNA/mL.    TSH 07/26/2021 2.079  0.350 -  4.500 uIU/mL Final   Comment: Performed by a 3rd Generation assay with a functional sensitivity of <=0.01 uIU/mL. Performed at [facility], [address], [place], [place] [address]    Sodium 07/28/2021 134 (A) 135 - 145 mmol/L Final   Potassium 07/28/2021 4.1  3.5 - 5.1 mmol/L Final   Chloride 07/28/2021 99  98 - 111 mmol/L Final   CO2 07/28/2021 26  22 - 32 mmol/L Final   Glucose, Bld 07/28/2021 96  70 - 99 mg/dL Final   Glucose reference range applies only to samples taken after fasting for at least 8 hours.   BUN 07/28/2021 11  6 - 20 mg/dL Final   Creatinine, Ser 07/28/2021 0.74  0.61 - 1.24 mg/dL Final   Calcium 60/45/4098 9.2  8.9 - 10.3 mg/dL Final   GFR, Estimated 07/28/2021 >60  >60 mL/min Final   Comment: (NOTE) Calculated using the CKD-EPI Creatinine Equation (2021)    Anion gap 07/28/2021 9  5 - 15 Final   Performed at [facility], [address] [place], [place] [address]   WBC 07/28/2021 3.6 (A) 4.0 - 10.5 K/uL Final   RBC 07/28/2021 3.87 (A) 4.22 - 5.81 MIL/uL Final   Hemoglobin 07/28/2021 13.3  13.0 - 17.0 g/dL Final   HCT 78/29/5621 40.0  39.0 - 52.0 % Final   MCV 07/28/2021 103.4 (A) 80.0 - 100.0 fL Final    MCH 07/28/2021 34.4 (A) 26.0 - 34.0 pg Final   MCHC 07/28/2021 33.3  30.0 - 36.0 g/dL Final   RDW 30/86/5784 17.2 (A) 11.5 - 15.5 % Final   Platelets 07/28/2021 140 (A) 150 - 400 K/uL Final   nRBC 07/28/2021 0.0  0.0 - 0.2 % Final   Neutrophils Relative % 07/28/2021 59  % Final   Neutro Abs 07/28/2021 2.1  1.7 - 7.7 K/uL Final   Lymphocytes Relative 07/28/2021 28  % Final   Lymphs Abs 07/28/2021 1.0  0.7 - 4.0 K/uL Final   Monocytes Relative 07/28/2021 9  % Final   Monocytes Absolute 07/28/2021 0.3  0.1 - 1.0 K/uL Final   Eosinophils Relative 07/28/2021 3  % Final   Eosinophils Absolute 07/28/2021 0.1  0.0 - 0.5 K/uL Final   Basophils Relative 07/28/2021 1  % Final   Basophils Absolute 07/28/2021 0.0  0.0 - 0.1 K/uL Final   Immature Granulocytes 07/28/2021 0  % Final   Abs Immature Granulocytes 07/28/2021 0.01  0.00 - 0.07 K/uL Final   Performed at [facility], [address] [place], [place] [address]   Magnesium 07/28/2021 2.1  1.7 - 2.4 mg/dL Final   Performed at [facility], [address], [place], [place] [address]   HIV 1 RNA Quant 07/30/2021 40  copies/mL Corrected   Comment: (NOTE) The reportable range for this assay is 20 to 10,000,000 copies HIV-1 RNA/mL.    LOG10 HIV-1 RNA 07/30/2021 1.602  log10copy/mL Final   Comment: (NOTE) Performed At: [facility] [address] [place], [place] [id number] Jolene Schimke MD OZ:[id number]   Admission on 07/24/2021, Discharged on 07/26/2021  Component Date Value Ref Range Status   WBC 07/24/2021 2.8 (A) 4.0 - 10.5 K/uL Final   RBC 07/24/2021 4.00 (A) 4.22 - 5.81 MIL/uL Final   Hemoglobin 07/24/2021 13.5  13.0 - 17.0 g/dL Final   HCT 25/95/6387 40.3  39.0 - 52.0 % Final   MCV 07/24/2021 100.8 (A) 80.0 - 100.0 fL Final   [facility] 07/24/2021  33.8  26.0 - 34.0 pg Final   MCHC 07/24/2021 33.5  30.0 - 36.0 g/dL Final   RDW 16/08/9603 17.5 (A) 11.5 - 15.5 % Final   Platelets 07/24/2021 180   150 - 400 K/uL Final   nRBC 07/24/2021 0.0  0.0 - 0.2 % Final   Neutrophils Relative % 07/24/2021 37  % Final   Neutro Abs 07/24/2021 1.0 (A) 1.7 - 7.7 K/uL Final   Lymphocytes Relative 07/24/2021 49  % Final   Lymphs Abs 07/24/2021 1.4  0.7 - 4.0 K/uL Final   Monocytes Relative 07/24/2021 9  % Final   Monocytes Absolute 07/24/2021 0.2  0.1 - 1.0 K/uL Final   Eosinophils Relative 07/24/2021 4  % Final   Eosinophils Absolute 07/24/2021 0.1  0.0 - 0.5 K/uL Final   Basophils Relative 07/24/2021 1  % Final   Basophils Absolute 07/24/2021 0.0  0.0 - 0.1 K/uL Final   Immature Granulocytes 07/24/2021 0  % Final   Abs Immature Granulocytes 07/24/2021 0.00  0.00 - 0.07 K/uL Final   Performed at [name], [address], [place], [place] [address]   Sodium 07/24/2021 139  135 - 145 mmol/L Final   Potassium 07/24/2021 3.7  3.5 - 5.1 mmol/L Final   Chloride 07/24/2021 101  98 - 111 mmol/L Final   CO2 07/24/2021 26  22 - 32 mmol/L Final   Glucose, Bld 07/24/2021 98  70 - 99 mg/dL Final   Glucose reference range applies only to samples taken after fasting for at least 8 hours.   BUN 07/24/2021 5 (A) 6 - 20 mg/dL Final   Creatinine, Ser 07/24/2021 0.64  0.61 - 1.24 mg/dL Final   Calcium 11/91/4782 9.0  8.9 - 10.3 mg/dL Final   Total Protein 95/62/1308 7.3  6.5 - 8.1 g/dL Final   Albumin 65/78/4696 4.2  3.5 - 5.0 g/dL Final   AST 29/52/8413 41  15 - 41 U/L Final   ALT 07/24/2021 27  0 - 44 U/L Final   Alkaline Phosphatase 07/24/2021 75  38 - 126 U/L Final   Total Bilirubin 07/24/2021 1.0  0.3 - 1.2 mg/dL Final   GFR, Estimated 07/24/2021 >60  >60 mL/min Final   Comment: (NOTE) Calculated using the CKD-EPI Creatinine Equation (2021)    Anion gap 07/24/2021 12  5 - 15 Final   Performed at [facility], [address], [place], [place] [address]   Alcohol, Ethyl (B) 07/24/2021 231 (A) <10 mg/dL Final   Comment: (NOTE) Lowest detectable limit for serum  alcohol is 10 mg/dL.  For medical purposes only. Performed at [facility], [address] [place], [place] [address]    Salicylate Lvl 07/24/2021 <7.0 (A) 7.0 - 30.0 mg/dL Final   Performed at [facility], [address], [place], [place] [address]   Acetaminophen (Tylenol), Serum 07/24/2021 <10 (A) 10 - 30 ug/mL Final   Comment: (NOTE) Therapeutic concentrations vary significantly. A range of 10-30 ug/mL  may be an effective concentration for many patients. However, some  are best treated at concentrations outside of this range. Acetaminophen concentrations >150 ug/mL at 4 hours after ingestion  and >50 ug/mL at 12 hours after ingestion are often associated with  toxic reactions.  Performed at [facility], [address], [place], [place] [address]    Opiates 07/24/2021 NONE DETECTED  NONE DETECTED Final   Cocaine 07/24/2021 NONE DETECTED  NONE DETECTED Final   Benzodiazepines  07/24/2021 POSITIVE (A) NONE DETECTED Final   Amphetamines 07/24/2021 NONE DETECTED  NONE DETECTED Final   Tetrahydrocannabinol 07/24/2021 NONE DETECTED  NONE DETECTED Final   Barbiturates 07/24/2021 NONE DETECTED  NONE DETECTED Final   Comment: (NOTE) DRUG SCREEN FOR MEDICAL PURPOSES ONLY.  IF CONFIRMATION IS NEEDED FOR ANY PURPOSE, NOTIFY LAB WITHIN 5 DAYS.  LOWEST DETECTABLE LIMITS FOR URINE DRUG SCREEN Drug Class                     Cutoff (ng/mL) Amphetamine and metabolites    1000 Barbiturate and metabolites    200 Benzodiazepine                 200 Tricyclics and metabolites     300 Opiates and metabolites        300 Cocaine and metabolites        300 THC                            50 Performed at [facility], [address] [place], [place] [address]    SARS Coronavirus 2 by RT PCR 07/24/2021 NEGATIVE  NEGATIVE Final   Comment: (NOTE) SARS-CoV-2 target nucleic acids are NOT DETECTED.  The SARS-CoV-2 RNA is  generally detectable in upper respiratory specimens during the acute phase of infection. The lowest concentration of SARS-CoV-2 viral copies this assay can detect is 138 copies/mL. A negative result does not preclude SARS-Cov-2 infection and should not be used as the sole basis for treatment or other patient management decisions. A negative result may occur with  improper specimen collection/handling, submission of specimen other than nasopharyngeal swab, presence of viral mutation(s) within the areas targeted by this assay, and inadequate number of viral copies(<138 copies/mL). A negative result must be combined with clinical observations, patient history, and epidemiological information. The expected result is Negative.  Fact Sheet for Patients:  BloggerCourse.com  Fact Sheet for Healthcare Providers:  SeriousBroker.it  This test is no                          t yet approved or cleared by the [place] FDA and  has been authorized for detection and/or diagnosis of SARS-CoV-2 by FDA under an Emergency Use Authorization (EUA). This EUA will remain  in effect (meaning this test can be used) for the duration of the COVID-19 declaration under Section 564(b)(1) of the Act, 21 U.S.C.section 360bbb-3(b)(1), unless the authorization is terminated  or revoked sooner.       Influenza A by PCR 07/24/2021 NEGATIVE  NEGATIVE Final   Influenza B by PCR 07/24/2021 NEGATIVE  NEGATIVE Final   Comment: (NOTE) The Xpert Xpress SARS-CoV-2/FLU/RSV plus assay is intended as an aid in the diagnosis of influenza from Nasopharyngeal swab specimens and should not be used as a sole basis for treatment. Nasal washings and aspirates are unacceptable for Xpert Xpress SARS-CoV-2/FLU/RSV testing.  Fact Sheet for Patients: BloggerCourse.com  Fact Sheet for Healthcare Providers: SeriousBroker.it  This  test is not yet approved or cleared by the [place] FDA and has been authorized for detection and/or diagnosis of SARS-CoV-2 by FDA under an Emergency Use Authorization (EUA). This EUA will remain in effect (meaning this test can be used) for the duration of the COVID-19 declaration under Section 564(b)(1) of the Act, 21 U.S.C. section 360bbb-3(b)(1), unless the authorization is terminated or revoked.  Performed at [facility], [address] [place], [place] [address]    Troponin I (High Sensitivity) 07/25/2021 4  <18 ng/L Final   Comment: (NOTE) Elevated high sensitivity troponin I (hsTnI) values and significant  changes across serial measurements may suggest ACS but many other  chronic and acute conditions are known to elevate hsTnI results.  Refer to the "Links" section for chest pain algorithms and additional  guidance. Performed at [facility], [address] [place], [place] [address]   Admission on 07/20/2021, Discharged on 07/21/2021  Component Date Value Ref Range Status   Alcohol, Ethyl (B) 07/20/2021 308 (A) <10 mg/dL Final   Comment: CRITICAL RESULT CALLED TO, READ BACK BY AND VERIFIED WITH: NASH, J @1931  BY BATTLET (NOTE) Lowest detectable limit for serum alcohol is 10 mg/dL.  For medical purposes only. Performed at [facility], [address] [place], [place] [address]    WBC 07/20/2021 3.6 (A) 4.0 - 10.5 K/uL Final   RBC 07/20/2021 4.28  4.22 - 5.81 MIL/uL Final   Hemoglobin 07/20/2021 14.4  13.0 - 17.0 g/dL Final   HCT 09/47/0962 42.8  39.0 - 52.0 % Final   MCV 07/20/2021 100.0  80.0 - 100.0 fL Final   MCH 07/20/2021 33.6  26.0 - 34.0 pg Final   MCHC 07/20/2021 33.6  30.0 - 36.0 g/dL Final   RDW 83/66/2947 17.3 (A) 11.5 - 15.5 % Final   Platelets 07/20/2021 274  150 - 400 K/uL Final   nRBC 07/20/2021 0.0  0.0 - 0.2 % Final   Neutrophils Relative % 07/20/2021 42  % Final   Neutro Abs  07/20/2021 1.5 (A) 1.7 - 7.7 K/uL Final   Lymphocytes Relative 07/20/2021 43  % Final   Lymphs Abs 07/20/2021 1.6  0.7 - 4.0 K/uL Final   Monocytes Relative 07/20/2021 9  % Final   Monocytes Absolute 07/20/2021 0.3  0.1 - 1.0 K/uL Final   Eosinophils Relative 07/20/2021 4  % Final   Eosinophils Absolute 07/20/2021 0.1  0.0 - 0.5 K/uL Final   Basophils Relative 07/20/2021 2  % Final   Basophils Absolute 07/20/2021 0.1  0.0 - 0.1 K/uL Final   Immature Granulocytes 07/20/2021 0  % Final   Abs Immature Granulocytes 07/20/2021 0.01  0.00 - 0.07 K/uL Final   Performed at [facility], [address], [place], [place] [address]   Sodium 07/20/2021 138  135 - 145 mmol/L Final   Potassium 07/20/2021 3.6  3.5 - 5.1 mmol/L Final   Chloride 07/20/2021 101  98 - 111 mmol/L Final   CO2 07/20/2021 26  22 - 32 mmol/L Final   Glucose, Bld 07/20/2021 97  70 - 99 mg/dL Final   Glucose reference range applies only to samples taken after fasting for at least 8 hours.   BUN 07/20/2021 5 (A) 6 - 20 mg/dL Final   Creatinine, Ser 07/20/2021 0.61  0.61 - 1.24 mg/dL Final   Calcium 03/54/6568 8.9  8.9 - 10.3 mg/dL Final   Total Protein 12/75/1700 8.1  6.5 - 8.1 g/dL Final   Albumin 17/49/4496 4.3  3.5 - 5.0 g/dL Final   AST 75/91/6384 52 (A) 15 - 41 U/L Final   ALT 07/20/2021 35  0 - 44 U/L Final   Alkaline Phosphatase 07/20/2021 89  38 - 126 U/L Final   Total Bilirubin 07/20/2021 0.9  0.3 - 1.2 mg/dL Final   GFR, Estimated 07/20/2021 >60  >60 mL/min Final  Comment: (NOTE) Calculated using the CKD-EPI Creatinine Equation (2021)    Anion gap 07/20/2021 11  5 - 15 Final   Performed at [facility], [address] [place], [place] [address]   Lipase 07/20/2021 40  11 - 51 U/L Final   Performed at [facility], [address], [place], [place] [address]   SARS Coronavirus 2 by RT PCR 07/21/2021 NEGATIVE  NEGATIVE Final   Comment: (NOTE) SARS-CoV-2  target nucleic acids are NOT DETECTED.  The SARS-CoV-2 RNA is generally detectable in upper respiratory specimens during the acute phase of infection. The lowest concentration of SARS-CoV-2 viral copies this assay can detect is 138 copies/mL. A negative result does not preclude SARS-Cov-2 infection and should not be used as the sole basis for treatment or other patient management decisions. A negative result may occur with  improper specimen collection/handling, submission of specimen other than nasopharyngeal swab, presence of viral mutation(s) within the areas targeted by this assay, and inadequate number of viral copies(<138 copies/mL). A negative result must be combined with clinical observations, patient history, and epidemiological information. The expected result is Negative.  Fact Sheet for Patients:  BloggerCourse.com  Fact Sheet for Healthcare Providers:  SeriousBroker.it  This test is no                          t yet approved or cleared by the [place] FDA and  has been authorized for detection and/or diagnosis of SARS-CoV-2 by FDA under an Emergency Use Authorization (EUA). This EUA will remain  in effect (meaning this test can be used) for the duration of the COVID-19 declaration under Section 564(b)(1) of the Act, 21 U.S.C.section 360bbb-3(b)(1), unless the authorization is terminated  or revoked sooner.       Influenza A by PCR 07/21/2021 NEGATIVE  NEGATIVE Final   Influenza B by PCR 07/21/2021 NEGATIVE  NEGATIVE Final   Comment: (NOTE) The Xpert Xpress SARS-CoV-2/FLU/RSV plus assay is intended as an aid in the diagnosis of influenza from Nasopharyngeal swab specimens and should not be used as a sole basis for treatment. Nasal washings and aspirates are unacceptable for Xpert Xpress SARS-CoV-2/FLU/RSV testing.  Fact Sheet for Patients: BloggerCourse.com  Fact Sheet for Healthcare  Providers: SeriousBroker.it  This test is not yet approved or cleared by the [place] FDA and has been authorized for detection and/or diagnosis of SARS-CoV-2 by FDA under an Emergency Use Authorization (EUA). This EUA will remain in effect (meaning this test can be used) for the duration of the COVID-19 declaration under Section 564(b)(1) of the Act, 21 U.S.C. section 360bbb-3(b)(1), unless the authorization is terminated or revoked.  Performed at [facility], [address] [place], [place] [address]   Admission on 07/11/2021, Discharged on 07/13/2021  Component Date Value Ref Range Status   SARS Coronavirus 2 by RT PCR 07/11/2021 NEGATIVE  NEGATIVE Final   Comment: (NOTE) SARS-CoV-2 target nucleic acids are NOT DETECTED.  The SARS-CoV-2 RNA is generally detectable in upper respiratory specimens during the acute phase of infection. The lowest concentration of SARS-CoV-2 viral copies this assay can detect is 138 copies/mL. A negative result does not preclude SARS-Cov-2 infection and should not be used as the sole basis for treatment or other patient management decisions. A negative result may occur with  improper specimen collection/handling, submission of specimen other than nasopharyngeal swab, presence of viral mutation(s) within the areas targeted by this assay, and inadequate  number of viral copies(<138 copies/mL). A negative result must be combined with clinical observations, patient history, and epidemiological information. The expected result is Negative.  Fact Sheet for Patients:  BloggerCourse.com  Fact Sheet for Healthcare Providers:  SeriousBroker.it  This test is no                          t yet approved or cleared by the [place] FDA and  has been authorized for detection and/or diagnosis of SARS-CoV-2 by FDA under an Emergency Use Authorization (EUA).  This EUA will remain  in effect (meaning this test can be used) for the duration of the COVID-19 declaration under Section 564(b)(1) of the Act, 21 U.S.C.section 360bbb-3(b)(1), unless the authorization is terminated  or revoked sooner.       Influenza A by PCR 07/11/2021 NEGATIVE  NEGATIVE Final   Influenza B by PCR 07/11/2021 NEGATIVE  NEGATIVE Final   Comment: (NOTE) The Xpert Xpress SARS-CoV-2/FLU/RSV plus assay is intended as an aid in the diagnosis of influenza from Nasopharyngeal swab specimens and should not be used as a sole basis for treatment. Nasal washings and aspirates are unacceptable for Xpert Xpress SARS-CoV-2/FLU/RSV testing.  Fact Sheet for Patients: BloggerCourse.com  Fact Sheet for Healthcare Providers: SeriousBroker.it  This test is not yet approved or cleared by the [place] FDA and has been authorized for detection and/or diagnosis of SARS-CoV-2 by FDA under an Emergency Use Authorization (EUA). This EUA will remain in effect (meaning this test can be used) for the duration of the COVID-19 declaration under Section 564(b)(1) of the Act, 21 U.S.C. section 360bbb-3(b)(1), unless the authorization is terminated or revoked.  Performed at [facility], [address] [place], [place] [address]    Sodium 07/11/2021 139  135 - 145 mmol/L Final   Potassium 07/11/2021 3.8  3.5 - 5.1 mmol/L Final   Chloride 07/11/2021 103  98 - 111 mmol/L Final   CO2 07/11/2021 24  22 - 32 mmol/L Final   Glucose, Bld 07/11/2021 99  70 - 99 mg/dL Final   Glucose reference range applies only to samples taken after fasting for at least 8 hours.   BUN 07/11/2021 <5 (A) 6 - 20 mg/dL Final   Creatinine, Ser 07/11/2021 0.68  0.61 - 1.24 mg/dL Final   Calcium 60/45/4098 8.3 (A) 8.9 - 10.3 mg/dL Final   Total Protein 11/91/4782 6.8  6.5 - 8.1 g/dL Final   Albumin 95/62/1308 3.5  3.5 - 5.0 g/dL Final   AST 65/78/4696 58 (A)  15 - 41 U/L Final   ALT 07/11/2021 42  0 - 44 U/L Final   Alkaline Phosphatase 07/11/2021 85  38 - 126 U/L Final   Total Bilirubin 07/11/2021 0.9  0.3 - 1.2 mg/dL Final   GFR, Estimated 07/11/2021 >60  >60 mL/min Final   Comment: (NOTE) Calculated using the CKD-EPI Creatinine Equation (2021)    Anion gap 07/11/2021 12  5 - 15 Final   Performed at [facility], [address], [place], [place] [address]   Alcohol, Ethyl (B) 07/11/2021 388 (A) <10 mg/dL Final   Comment: CRITICAL RESULT CALLED TO, READ BACK BY AND VERIFIED WITH: Teryl Lucy RN 07/11/21 1516 M KOROLESKI (NOTE) Lowest detectable limit for serum alcohol is 10 mg/dL.  For medical purposes only. Performed at [facility], [address], [place], [place] [address]    Opiates 07/11/2021 NONE DETECTED  NONE DETECTED Final  Cocaine 07/11/2021 NONE DETECTED  NONE DETECTED Final   Benzodiazepines 07/11/2021 POSITIVE (A) NONE DETECTED Final   Amphetamines 07/11/2021 NONE DETECTED  NONE DETECTED Final   Tetrahydrocannabinol 07/11/2021 NONE DETECTED  NONE DETECTED Final   Barbiturates 07/11/2021 NONE DETECTED  NONE DETECTED Final   Comment: (NOTE) DRUG SCREEN FOR MEDICAL PURPOSES ONLY.  IF CONFIRMATION IS NEEDED FOR ANY PURPOSE, NOTIFY LAB WITHIN 5 DAYS.  LOWEST DETECTABLE LIMITS FOR URINE DRUG SCREEN Drug Class                     Cutoff (ng/mL) Amphetamine and metabolites    1000 Barbiturate and metabolites    200 Benzodiazepine                 200 Tricyclics and metabolites     300 Opiates and metabolites        300 Cocaine and metabolites        300 THC                            50 Performed at [facility], [address], [place], [place] [address]    WBC 07/11/2021 4.2  4.0 - 10.5 K/uL Final   RBC 07/11/2021 3.43 (A) 4.22 - 5.81 MIL/uL Final   Hemoglobin 07/11/2021 11.5 (A) 13.0 - 17.0 g/dL Final   HCT 11/11/7251 33.9 (A) 39.0 - 52.0 % Final   MCV 07/11/2021 98.8  80.0 - 100.0 fL Final   MCH  07/11/2021 33.5  26.0 - 34.0 pg Final   MCHC 07/11/2021 33.9  30.0 - 36.0 g/dL Final   RDW 66/44/0347 16.4 (A) 11.5 - 15.5 % Final   Platelets 07/11/2021 148 (A) 150 - 400 K/uL Final   nRBC 07/11/2021 0.0  0.0 - 0.2 % Final   Neutrophils Relative % 07/11/2021 60  % Final   Neutro Abs 07/11/2021 2.5  1.7 - 7.7 K/uL Final   Lymphocytes Relative 07/11/2021 27  % Final   Lymphs Abs 07/11/2021 1.1  0.7 - 4.0 K/uL Final   Monocytes Relative 07/11/2021 9  % Final   Monocytes Absolute 07/11/2021 0.4  0.1 - 1.0 K/uL Final   Eosinophils Relative 07/11/2021 3  % Final   Eosinophils Absolute 07/11/2021 0.1  0.0 - 0.5 K/uL Final   Basophils Relative 07/11/2021 1  % Final   Basophils Absolute 07/11/2021 0.1  0.0 - 0.1 K/uL Final   Immature Granulocytes 07/11/2021 0  % Final   Abs Immature Granulocytes 07/11/2021 0.01  0.00 - 0.07 K/uL Final   Performed at [facility], [address], [place], [place] [address]  Admission on 07/07/2021, Discharged on 07/08/2021  Component Date Value Ref Range Status   Alcohol, Ethyl (B) 07/07/2021 352 (A) <10 mg/dL Final   Comment: CRITICAL RESULT CALLED TO, READ BACK BY AND VERIFIED WITH: RSherrilee Gilles, RN. @2341  [id number] BLANKENSHIP R.  (NOTE) Lowest detectable limit for serum alcohol is 10 mg/dL.  For medical purposes only. Performed at [facility], [address] [place], [place] [address]    Sodium 07/07/2021 138  135 - 145 mmol/L Final   Potassium 07/07/2021 3.5  3.5 - 5.1 mmol/L Final   Chloride 07/07/2021 102  98 - 111 mmol/L Final   CO2 07/07/2021 24  22 - 32 mmol/L Final   Glucose, Bld 07/07/2021 108 (A) 70 - 99 mg/dL Final   Glucose reference range applies only to  samples taken after fasting for at least 8 hours.   BUN 07/07/2021 <5 (A) 6 - 20 mg/dL Final   Creatinine, Ser 07/07/2021 0.63  0.61 - 1.24 mg/dL Final   Calcium 16/08/9603 8.8 (A) 8.9 - 10.3 mg/dL Final   Total Protein 54/07/8118 7.0  6.5 - 8.1 g/dL Final   Albumin  14/78/2956 3.5  3.5 - 5.0 g/dL Final   AST 21/30/8657 69 (A) 15 - 41 U/L Final   ALT 07/07/2021 48 (A) 0 - 44 U/L Final   Alkaline Phosphatase 07/07/2021 68  38 - 126 U/L Final   Total Bilirubin 07/07/2021 0.7  0.3 - 1.2 mg/dL Final   GFR, Estimated 07/07/2021 >60  >60 mL/min Final   Comment: (NOTE) Calculated using the CKD-EPI Creatinine Equation (2021)    Anion gap 07/07/2021 12  5 - 15 Final   Performed at [facility], [address] [place], [place] [address]   Lipase 07/07/2021 54 (A) 11 - 51 U/L Final   Performed at [facility], [address], [place], [place] [address]   WBC 07/07/2021 4.3  4.0 - 10.5 K/uL Final   RBC 07/07/2021 3.58 (A) 4.22 - 5.81 MIL/uL Final   Hemoglobin 07/07/2021 12.0 (A) 13.0 - 17.0 g/dL Final   HCT 41/32/4401 34.4 (A) 39.0 - 52.0 % Final   MCV 07/07/2021 96.1  80.0 - 100.0 fL Final   MCH 07/07/2021 33.5  26.0 - 34.0 pg Final   MCHC 07/07/2021 34.9  30.0 - 36.0 g/dL Final   RDW 02/72/5366 14.6  11.5 - 15.5 % Final   Platelets 07/07/2021 134 (A) 150 - 400 K/uL Final   nRBC 07/07/2021 0.0  0.0 - 0.2 % Final   Neutrophils Relative % 07/07/2021 44  % Final   Neutro Abs 07/07/2021 1.9  1.7 - 7.7 K/uL Final   Lymphocytes Relative 07/07/2021 44  % Final   Lymphs Abs 07/07/2021 1.9  0.7 - 4.0 K/uL Final   Monocytes Relative 07/07/2021 9  % Final   Monocytes Absolute 07/07/2021 0.4  0.1 - 1.0 K/uL Final   Eosinophils Relative 07/07/2021 2  % Final   Eosinophils Absolute 07/07/2021 0.1  0.0 - 0.5 K/uL Final   Basophils Relative 07/07/2021 1  % Final   Basophils Absolute 07/07/2021 0.0  0.0 - 0.1 K/uL Final   Immature Granulocytes 07/07/2021 0  % Final   Abs Immature Granulocytes 07/07/2021 0.01  0.00 - 0.07 K/uL Final   Performed at [facility], [address] [place], [place] [address]   Opiates 07/07/2021 NONE DETECTED  NONE DETECTED Final   Cocaine 07/07/2021 NONE DETECTED  NONE DETECTED Final   Benzodiazepines 07/07/2021 NONE  DETECTED  NONE DETECTED Final   Amphetamines 07/07/2021 POSITIVE (A) NONE DETECTED Final   Tetrahydrocannabinol 07/07/2021 NONE DETECTED  NONE DETECTED Final   Barbiturates 07/07/2021 NONE DETECTED  NONE DETECTED Final   Comment: (NOTE) DRUG SCREEN FOR MEDICAL PURPOSES ONLY.  IF CONFIRMATION IS NEEDED FOR ANY PURPOSE, NOTIFY LAB WITHIN 5 DAYS.  LOWEST DETECTABLE LIMITS FOR URINE DRUG SCREEN Drug Class                     Cutoff (ng/mL) Amphetamine and metabolites    1000 Barbiturate and metabolites    200 Benzodiazepine                 200 Tricyclics and metabolites     300 Opiates and metabolites  300 Cocaine and metabolites        300 THC                            50 Performed at [facility], [address], [place], [place] [address]    Color, Urine 07/07/2021 AMBER (A) YELLOW Final   BIOCHEMICALS MAY BE AFFECTED BY COLOR   APPearance 07/07/2021 HAZY (A) CLEAR Final   Specific Gravity, Urine 07/07/2021 1.021  1.005 - 1.030 Final   pH 07/07/2021 6.0  5.0 - 8.0 Final   Glucose, UA 07/07/2021 NEGATIVE  NEGATIVE mg/dL Final   Hgb urine dipstick 07/07/2021 SMALL (A) NEGATIVE Final   Bilirubin Urine 07/07/2021 NEGATIVE  NEGATIVE Final   Ketones, ur 07/07/2021 NEGATIVE  NEGATIVE mg/dL Final   Protein, ur 60/45/4098 30 (A) NEGATIVE mg/dL Final   Nitrite 11/91/4782 NEGATIVE  NEGATIVE Final   Leukocytes,Ua 07/07/2021 NEGATIVE  NEGATIVE Final   RBC / HPF 07/07/2021 0-5  0 - 5 RBC/hpf Final   WBC, UA 07/07/2021 0-5  0 - 5 WBC/hpf Final   Bacteria, UA 07/07/2021 RARE (A) NONE SEEN Final   Squamous Epithelial / LPF 07/07/2021 0-5  0 - 5 Final   Mucus 07/07/2021 PRESENT   Final   Hyaline Casts, UA 07/07/2021 PRESENT   Final   Performed at [facility], [address], [place], [place] [address]  Admission on 07/03/2021, Discharged on 07/03/2021  Component Date Value Ref Range Status   Sodium 07/03/2021 141  135 - 145 mmol/L Final   Potassium 07/03/2021 3.7  3.5 -  5.1 mmol/L Final   Chloride 07/03/2021 107  98 - 111 mmol/L Final   CO2 07/03/2021 24  22 - 32 mmol/L Final   Glucose, Bld 07/03/2021 100 (A) 70 - 99 mg/dL Final   Glucose reference range applies only to samples taken after fasting for at least 8 hours.   BUN 07/03/2021 5 (A) 6 - 20 mg/dL Final   Creatinine, Ser 07/03/2021 0.62  0.61 - 1.24 mg/dL Final   Calcium 30/86/5784 8.5 (A) 8.9 - 10.3 mg/dL Final   GFR, Estimated 07/03/2021 >60  >60 mL/min Final   Comment: (NOTE) Calculated using the CKD-EPI Creatinine Equation (2021)    Anion gap 07/03/2021 10  5 - 15 Final   Performed at [facility], [address] [place], [place] [address]   WBC 07/03/2021 5.1  4.0 - 10.5 K/uL Final   RBC 07/03/2021 4.03 (A) 4.22 - 5.81 MIL/uL Final   Hemoglobin 07/03/2021 13.2  13.0 - 17.0 g/dL Final   HCT 52/84/1324 38.2 (A) 39.0 - 52.0 % Final   MCV 07/03/2021 94.8  80.0 - 100.0 fL Final   MCH 07/03/2021 32.8  26.0 - 34.0 pg Final   MCHC 07/03/2021 34.6  30.0 - 36.0 g/dL Final   RDW 40/08/2724 13.2  11.5 - 15.5 % Final   Platelets 07/03/2021 111 (A) 150 - 400 K/uL Final   Comment: Immature Platelet Fraction may be clinically indicated, consider ordering this additional test [id number] REPEATED TO VERIFY PLATELET COUNT CONFIRMED BY SMEAR    nRBC 07/03/2021 0.0  0.0 - 0.2 % Final   Performed at [facility], [address], [place], [place] [address]   Troponin I (High Sensitivity) 07/03/2021 6  <18 ng/L Final   Comment: (NOTE) Elevated high sensitivity troponin I (hsTnI) values and significant  changes across serial measurements may suggest  ACS but many other  chronic and acute conditions are known to elevate hsTnI results.  Refer to the "Links" section for chest pain algorithms and additional  guidance. Performed at [facility] Lab, [address], [place], [place] [address]    Alcohol, Ethyl (B) 07/03/2021 134 (A) <10 mg/dL Final   Comment: (NOTE) Lowest detectable limit for  serum alcohol is 10 mg/dL.  For medical purposes only. Performed at [facility], [address], [place], [place] [address]    Opiates 07/03/2021 NONE DETECTED  NONE DETECTED Final   Cocaine 07/03/2021 NONE DETECTED  NONE DETECTED Final   Benzodiazepines 07/03/2021 NONE DETECTED  NONE DETECTED Final   Amphetamines 07/03/2021 NONE DETECTED  NONE DETECTED Final   Tetrahydrocannabinol 07/03/2021 NONE DETECTED  NONE DETECTED Final   Barbiturates 07/03/2021 NONE DETECTED  NONE DETECTED Final   Comment: (NOTE) DRUG SCREEN FOR MEDICAL PURPOSES ONLY.  IF CONFIRMATION IS NEEDED FOR ANY PURPOSE, NOTIFY LAB WITHIN 5 DAYS.  LOWEST DETECTABLE LIMITS FOR URINE DRUG SCREEN Drug Class                     Cutoff (ng/mL) Amphetamine and metabolites    1000 Barbiturate and metabolites    200 Benzodiazepine                 200 Tricyclics and metabolites     300 Opiates and metabolites        300 Cocaine and metabolites        300 THC                            50 Performed at [facility], [address], [place], [place] [address]   Admission on 06/30/2021, Discharged on 07/01/2021  Component Date Value Ref Range Status   SARS Coronavirus 2 by RT PCR 06/30/2021 NEGATIVE  NEGATIVE Final   Comment: (NOTE) SARS-CoV-2 target nucleic acids are NOT DETECTED.  The SARS-CoV-2 RNA is generally detectable in upper respiratory specimens during the acute phase of infection. The lowest concentration of SARS-CoV-2 viral copies this assay can detect is 138 copies/mL. A negative result does not preclude SARS-Cov-2 infection and should not be used as the sole basis for treatment or other patient management decisions. A negative result may occur with  improper specimen collection/handling, submission of specimen other than nasopharyngeal swab, presence of viral mutation(s) within the areas targeted by this assay, and inadequate number of viral copies(<138 copies/mL). A negative result must be  combined with clinical observations, patient history, and epidemiological information. The expected result is Negative.  Fact Sheet for Patients:  BloggerCourse.com  Fact Sheet for Healthcare Providers:  SeriousBroker.it  This test is no                          t yet approved or cleared by the [place] FDA and  has been authorized for detection and/or diagnosis of SARS-CoV-2 by FDA under an Emergency Use Authorization (EUA). This EUA will remain  in effect (meaning this test can be used) for the duration of the COVID-19 declaration under Section 564(b)(1) of the Act, 21 U.S.C.section 360bbb-3(b)(1), unless the authorization is terminated  or revoked sooner.       Influenza A by PCR 06/30/2021 NEGATIVE  NEGATIVE Final   Influenza B by PCR 06/30/2021 NEGATIVE  NEGATIVE Final   Comment: (NOTE) The Xpert Xpress  SARS-CoV-2/FLU/RSV plus assay is intended as an aid in the diagnosis of influenza from Nasopharyngeal swab specimens and should not be used as a sole basis for treatment. Nasal washings and aspirates are unacceptable for Xpert Xpress SARS-CoV-2/FLU/RSV testing.  Fact Sheet for Patients: BloggerCourse.com  Fact Sheet for Healthcare Providers: SeriousBroker.it  This test is not yet approved or cleared by the [place] FDA and has been authorized for detection and/or diagnosis of SARS-CoV-2 by FDA under an Emergency Use Authorization (EUA). This EUA will remain in effect (meaning this test can be used) for the duration of the COVID-19 declaration under Section 564(b)(1) of the Act, 21 U.S.C. section 360bbb-3(b)(1), unless the authorization is terminated or revoked.  Performed at [facility], [address] [place], [place] [address]    Sodium 06/30/2021 138  135 - 145 mmol/L Final   Potassium 06/30/2021 3.3 (A) 3.5 - 5.1 mmol/L Final   Chloride  06/30/2021 104  98 - 111 mmol/L Final   CO2 06/30/2021 22  22 - 32 mmol/L Final   Glucose, Bld 06/30/2021 109 (A) 70 - 99 mg/dL Final   Glucose reference range applies only to samples taken after fasting for at least 8 hours.   BUN 06/30/2021 5 (A) 6 - 20 mg/dL Final   Creatinine, Ser 06/30/2021 0.75  0.61 - 1.24 mg/dL Final   Calcium 60/45/4098 9.0  8.9 - 10.3 mg/dL Final   Total Protein 11/91/4782 7.5  6.5 - 8.1 g/dL Final   Albumin 95/62/1308 4.1  3.5 - 5.0 g/dL Final   AST 65/78/4696 49 (A) 15 - 41 U/L Final   ALT 06/30/2021 35  0 - 44 U/L Final   Alkaline Phosphatase 06/30/2021 74  38 - 126 U/L Final   Total Bilirubin 06/30/2021 0.5  0.3 - 1.2 mg/dL Final   GFR, Estimated 06/30/2021 >60  >60 mL/min Final   Comment: (NOTE) Calculated using the CKD-EPI Creatinine Equation (2021)    Anion gap 06/30/2021 12  5 - 15 Final   Performed at [facility], [address], [place], [place] [address]   Alcohol, Ethyl (B) 06/30/2021 381 (A) <10 mg/dL Final   Comment: CRITICAL RESULT CALLED TO, READ BACK BY AND VERIFIED WITH: L SHULAR RN BY SSTEPHENS [phone] (NOTE) Lowest detectable limit for serum alcohol is 10 mg/dL.  For medical purposes only. Performed at [facility], [address], [place], [place] [address]    WBC 06/30/2021 4.4  4.0 - 10.5 K/uL Final   RBC 06/30/2021 4.15 (A) 4.22 - 5.81 MIL/uL Final   Hemoglobin 06/30/2021 13.4  13.0 - 17.0 g/dL Final   HCT 40/08/2724 40.1  39.0 - 52.0 % Final   MCV 06/30/2021 96.6  80.0 - 100.0 fL Final   MCH 06/30/2021 32.3  26.0 - 34.0 pg Final   MCHC 06/30/2021 33.4  30.0 - 36.0 g/dL Final   RDW 36/64/4034 12.9  11.5 - 15.5 % Final   Platelets 06/30/2021 132 (A) 150 - 400 K/uL Final   nRBC 06/30/2021 0.0  0.0 - 0.2 % Final   Neutrophils Relative % 06/30/2021 47  % Final   Neutro Abs 06/30/2021 2.0  1.7 - 7.7 K/uL Final   Lymphocytes Relative 06/30/2021 44  % Final   Lymphs Abs 06/30/2021 1.9  0.7 - 4.0 K/uL Final    Monocytes Relative 06/30/2021 5  % Final   Monocytes Absolute 06/30/2021 0.2  0.1 - 1.0 K/uL Final   Eosinophils Relative 06/30/2021  3  % Final   Eosinophils Absolute 06/30/2021 0.1  0.0 - 0.5 K/uL Final   Basophils Relative 06/30/2021 1  % Final   Basophils Absolute 06/30/2021 0.0  0.0 - 0.1 K/uL Final   Immature Granulocytes 06/30/2021 0  % Final   Abs Immature Granulocytes 06/30/2021 0.01  0.00 - 0.07 K/uL Final   Performed at [facility], [address] [place], [place] [address]   Acetaminophen (Tylenol), Serum 06/30/2021 <10 (A) 10 - 30 ug/mL Final   Comment: (NOTE) Therapeutic concentrations vary significantly. A range of 10-30 ug/mL  may be an effective concentration for many patients. However, some  are best treated at concentrations outside of this range. Acetaminophen concentrations >150 ug/mL at 4 hours after ingestion  and >50 ug/mL at 12 hours after ingestion are often associated with  toxic reactions.  Performed at [facility], [address] [place], [place] [address]    Salicylate Lvl 06/30/2021 <7.0 (A) 7.0 - 30.0 mg/dL Final   Performed at [facility], [address], [place], [place] [address]   Total CK 06/30/2021 820 (A) 49 - 397 U/L Final   Performed at [facility], [address], [place], [place] [address]  There may be more visits with results that are not included.    Blood Alcohol level:  Lab Results  Component Value Date   ETH 333 (HH) 08/26/2021   ETH 281 (H) 08/08/2021    Metabolic Disorder Labs: Lab Results  Component Value Date   HGBA1C 4.3 (L) 07/26/2021   MPG 76.71 07/26/2021   MPG 99.67 03/12/2021   No results found for: PROLACTIN Lab Results  Component Value Date   CHOL 198 07/26/2021   TRIG 209 (H) 07/26/2021   HDL 52 07/26/2021   CHOLHDL 3.8 07/26/2021   VLDL 42 (H) 07/26/2021   LDLCALC 104 (H) 07/26/2021   LDLCALC 79 09/19/2019    Therapeutic Lab Levels: No results found for: LITHIUM No  results found for: VALPROATE No components found for:  CBMZ  Physical Findings   AIMS    Flowsheet Row [facility]) [facility]) [facility]  AIMS Total Score 0 17      AUDIT    Flowsheet Row [facility]) [facility]08/2020 in  [facility] Admission (Discharged) [facility]  Alcohol Use Disorder Identification Test Final Score (AUDIT) 25 39 36      PHQ2-9    Flowsheet Row ED from 02/11/2021 in [facility] EMERGENCY DEPARTMENT  PHQ-2 Total Score 6  PHQ-9 Total Score 23      Flowsheet Row ED from 08/26/2021 in [facility] ED from 08/25/2021 in [facility] ED from 08/08/2021 in [facility] EM[facility]GENCY DEPARTMENT  C-SSRS RISK CATEGORY Low Risk High Risk Error: Question 6 not populated        Musculoskeletal  Strength & Muscle Tone: within normal limits Gait & Station: normal Patient leans: N/A  Psychiatric Specialty Exam  Presentation  General Appearance: Appropriate for Environment; Casual  Eye Contact:Fair  Speech:Clear and Coherent; Normal Rate  Speech Volume:Normal  Handedness:Right   Mood and Affect  Mood:Dysphoric  Affect:Congruent   Thought Process  Thought Processes:Coherent; Goal Directed; Linear  Descriptions of Associations:Intact  Orientation:Full (Time, Place and Person)  Thought Content:WDL; Logical  Diagnosis of Schizophrenia or Schizoaffective disorder in past: No  Duration of Psychotic Symptoms: Less than six months   Hallucinations:Hallucinations: None  Ideas of Reference:None  Suicidal Thoughts:Suicidal Thoughts: No  Homicidal Thoughts:Homicidal  Thoughts: No   Sensorium  Memory:Immediate Fair; Recent Poor; Remote Poor  Judgment:Intact  Insight:Present   Executive Functions  Concentration:Fair  Attention Span:Fair  Recall:Poor  Fund of Knowledge:Fair  Language:Fair   Psychomotor Activity  Psychomotor Activity:Psychomotor Activity: Normal; Tremor (tremor in BUE with outstretched arms)   Assets  Assets:Communication Skills; Desire for Improvement; Resilience   Sleep  Sleep:Sleep: Fair   No data recorded  Physical Exam  Physical Exam Constitutional:      Appearance: Normal appearance. He is normal weight.  HENT:     Head: Normocephalic and atraumatic.  Eyes:     Extraocular Movements: Extraocular movements intact.  Pulmonary:     Effort: Pulmonary effort is normal.  Neurological:     General: No focal deficit present.     Mental Status: He is alert and oriented to person, place, and time.  Psychiatric:        Attention and Perception: Attention and perception normal.        Speech: Speech normal.        Behavior: Behavior normal. Behavior is cooperative.        Thought Content: Thought content normal.   Review of Systems  Constitutional:  Negative for chills and fever.  HENT:  Negative for hearing loss.   Eyes:  Negative for discharge and redness.  Respiratory:  Negative for cough.   Cardiovascular:  Negative for chest pain.  Gastrointestinal:  Positive for nausea. Negative for abdominal pain and vomiting.       Mild nausea  Musculoskeletal:  Negative for myalgias.  Neurological:  Negative for headaches.  Blood pressure 114/84, pulse 72, temperature 98.1 F (36.7 C), temperature source Oral, resp. rate 18, SpO2 100 %. There is no height or weight on file to calculate BMI.  Treatment Plan Summary: 35 year old male with history of substance use, depression, HIV, who presented to [facility] ED on 10/17 for SI, patient was found attempting to jump into traffic and punching traffic signs.   Patient reported he was suicidal with a plan to stab himself at that time.  Patient reported drinking 6-7 bottles of wine a day last drink being prior to presentation.  It was assessed by TTS.  Patient had reported that for the past 2 weeks he was drinking 6 fifths of vodka daily for the past 2 days he been drinking 6 bottles of wine.  Patient was initially, however patient has been calm.  Patient was medically cleared in the ED and was accepted to [facility] for further treatment on 10/18. Etoh 333 and UDS+BZD + on presentation. Patient denies SI/HI/AVH today and requests assistance with detox-is tolerating ativan taper well. Patient is appropriate for [facility] at this time for continued safe detox in a controlled enviornment   Alcohol use disorder , severe SIMD -initiate ativan taper  -detox protocol -restart gabapentin 300 mg TID -vitamin and thiamine supplementation   HIV -continue biktarvy   Dispo: likely self care as patient is declining substance use treatment and is not interested in residential rehab.   Estella Husk, MD 08/27/2021 9:42 AM

## 2021-08-27 NOTE — ED Notes (Signed)
Snacks given 

## 2021-08-28 DIAGNOSIS — R45851 Suicidal ideations: Secondary | ICD-10-CM | POA: Diagnosis not present

## 2021-08-28 DIAGNOSIS — F10230 Alcohol dependence with withdrawal, uncomplicated: Secondary | ICD-10-CM | POA: Diagnosis not present

## 2021-08-28 DIAGNOSIS — F1994 Other psychoactive substance use, unspecified with psychoactive substance-induced mood disorder: Secondary | ICD-10-CM | POA: Diagnosis not present

## 2021-08-28 DIAGNOSIS — Z21 Asymptomatic human immunodeficiency virus [HIV] infection status: Secondary | ICD-10-CM | POA: Diagnosis not present

## 2021-08-28 MED ORDER — TRAZODONE HCL 100 MG PO TABS
100.0000 mg | ORAL_TABLET | Freq: Every evening | ORAL | Status: DC | PRN
  Administered 2021-08-28 – 2021-08-29 (×2): 100 mg via ORAL
  Filled 2021-08-28 (×2): qty 1

## 2021-08-28 NOTE — ED Provider Notes (Signed)
Behavioral Health Progress Note  Date and Time: 08/28/2021 1:34 PM Name: Gary [name] MRN:  [id number]  Subjective:    Patient seen and chart reviewed-he has been medication compliant, he has been attending groups. Per chart review, patient was irritable with staff last night.  Most recent CIWA score of 6. VSS.   On interview this morning patient is irritable, he ins interviewed near the nurses station after receiving scheduled ativan. .  Patient describes his mood as "not good".  Patient states he slept well after he received trazodone and describes how obtaining trazodone was difficult last night.  Patient reports some nausea, sweating, upset stomach, dizziness, generalized body aches and tremors related to alcohol withdrawal.  Patient denies vomiting and states he was able to tolerate breakfast.  He denies SI/HI/AVH.  Patient states that he does not feel that he is receiving enough Ativan.  Patient goes on to state that "every time" that he is admitted to the hospital and placed on Ativan taper he has never provided with enough Ativan.  Discussed with patient that detox is managed with an an Ativan taper but that he may be given additional Ativan based on his symptoms.  Patient verbalized understanding although was somewhat argumentative. When patient was advised to sit down due to feeling dizzy, he was agreeable and ambulated to where group was being held and sat in a chair.       Diagnosis:  Final diagnoses:  Substance induced mood disorder (HCC)  Alcohol withdrawal syndrome without complication (HCC)  Alcohol use disorder, severe, dependence (HCC)    Total Time spent with patient: 15 minutes  Past Psychiatric History: see H&P Past Medical History:  Past Medical History:  Diagnosis Date   Alcohol abuse    HIV (human immunodeficiency virus infection) (HCC)    Seizures (HCC)    Subdural hematoma     Past Surgical History:  Procedure Laterality Date   INCISION AND DRAINAGE  PERIRECTAL ABSCESS N/A 09/24/2016   Procedure: IRRIGATION AND DEBRIDEMENT PERIRECTAL ABSCESS;  Surgeon: Ricarda Frame, MD;  Location: [facility] ORS;  Service: General;  Laterality: N/A;   none     Family History:  Family History  Problem Relation Age of Onset   Alcohol abuse Mother    Alcohol abuse Father    Family Psychiatric  History: see H&p Social History:  Social History   Substance and Sexual Activity  Alcohol Use Yes   Comment: "I drink all I can"     Social History   Substance and Sexual Activity  Drug Use Yes   Types: Methamphetamines, Marijuana   Comment: last used 04/10/2019    Social History   Socioeconomic History   Marital status: Single    Spouse name: Not on file   Number of children: Not on file   Years of education: Not on file   Highest education level: Not on file  Occupational History   Occupation: unemployed  Tobacco Use   Smoking status: Every Day    Packs/day: 0.50    Types: Cigarettes   Smokeless tobacco: Never   Tobacco comments:    unable to smoke while incarcerated 6+ months 02/13/20  Vaping Use   Vaping Use: Never used  Substance and Sexual Activity   Alcohol use: Yes    Comment: "I drink all I can"   Drug use: Yes    Types: Methamphetamines, Marijuana    Comment: last used 04/10/2019   Sexual activity: Yes    Partners: Male, Male  Comment: declined condoms  03/2021  Other Topics Concern   Not on file  Social History Narrative   "Currently living on the streets"   Independent at baseline   Social Determinants of Health   Financial Resource Strain: Not on file  Food Insecurity: Not on file  Transportation Needs: Not on file  Physical Activity: Not on file  Stress: Not on file  Social Connections: Not on file   SDOH:  SDOH Screenings   Alcohol Screen: Medium Risk   Last Alcohol Screening Score (AUDIT): 25  Depression (PHQ2-9): Medium Risk   PHQ-2 Score: 23  Financial Resource Strain: Not on file  Food Insecurity: Not on  file  Housing: Not on file  Physical Activity: Not on file  Social Connections: Not on file  Stress: Not on file  Tobacco Use: High Risk   Smoking Tobacco Use: Every Day   Smokeless Tobacco Use: Never   Passive Exposure: Not on file  Transportation Needs: Not on file               Sleep: Good  Appetite:  Good  Current Medications:  Current Facility-Administered Medications  Medication Dose Route Frequency Provider Last Rate Last Admin   acetaminophen (TYLENOL) tablet 650 mg  650 mg Oral Q6H PRN Estella Husk, MD   650 mg at 08/28/21 1006   alum & mag hydroxide-simeth (MAALOX/MYLANTA) 200-200-20 MG/5ML suspension 30 mL  30 mL Oral Q4H PRN Estella Husk, MD       bictegravir-emtricitabine-tenofovir AF (BIKTARVY) 50-200-25 MG per tablet 1 tablet  1 tablet Oral Daily Estella Husk, MD   1 tablet at 08/28/21 1006   gabapentin (NEURONTIN) capsule 300 mg  300 mg Oral TID Estella Husk, MD   300 mg at 08/28/21 1006   hydrOXYzine (ATARAX/VISTARIL) tablet 25 mg  25 mg Oral TID PRN Estella Husk, MD   25 mg at 08/27/21 2116   hydrOXYzine (ATARAX/VISTARIL) tablet 25 mg  25 mg Oral Q6H PRN Estella Husk, MD   25 mg at 08/28/21 1101   loperamide (IMODIUM) capsule 2-4 mg  2-4 mg Oral PRN Estella Husk, MD       LORazepam (ATIVAN) tablet 1 mg  1 mg Oral Q6H PRN Estella Husk, MD       LORazepam (ATIVAN) tablet 1 mg  1 mg Oral TID Estella Husk, MD   1 mg at 08/28/21 1006   Followed by   Melene Muller ON 08/29/2021] LORazepam (ATIVAN) tablet 1 mg  1 mg Oral BID Estella Husk, MD       Followed by   Melene Muller ON 08/30/2021] LORazepam (ATIVAN) tablet 1 mg  1 mg Oral Daily Estella Husk, MD       magnesium hydroxide (MILK OF MAGNESIA) suspension 30 mL  30 mL Oral Daily PRN Estella Husk, MD       multivitamin with minerals tablet 1 tablet  1 tablet Oral Daily Estella Husk, MD   1 tablet at 08/28/21 1006    pantoprazole (PROTONIX) EC tablet 20 mg  20 mg Oral BID Estella Husk, MD   20 mg at 08/28/21 1006   promethazine (PHENERGAN) tablet 25 mg  25 mg Oral Q6H PRN Estella Husk, MD   25 mg at 08/28/21 1101   thiamine tablet 100 mg  100 mg Oral Daily Estella Husk, MD   100 mg at 08/28/21 1006   traZODone (DESYREL) tablet 100 mg  100 mg Oral QHS PRN Estella Husk, MD       Current Outpatient Medications  Medication Sig Dispense Refill   benztropine (COGENTIN) 1 MG tablet Take 1 tablet (1 mg total) by mouth 2 (two) times daily. (Patient not taking: Reported on 08/26/2021) 60 tablet 0   bictegravir-emtricitabine-tenofovir AF (BIKTARVY) 50-200-25 MG TABS tablet Take 1 tablet by mouth daily after lunch. 30 tablet 0   doxepin (SINEQUAN) 10 MG capsule Take 1 capsule (10 mg total) by mouth at bedtime. (Patient not taking: Reported on 08/26/2021) 30 capsule 0   folic acid (FOLVITE) 1 MG tablet Take 1 tablet (1 mg total) by mouth daily. (Patient not taking: Reported on 08/26/2021)     gabapentin (NEURONTIN) 300 MG capsule Take 1 capsule (300 mg total) by mouth 3 (three) times daily. (Patient not taking: Reported on 08/26/2021) 90 capsule 2   Multiple Vitamin (MULTIVITAMIN WITH MINERALS) TABS tablet Take 1 tablet by mouth daily. (Patient not taking: Reported on 08/26/2021)     pantoprazole (PROTONIX) 20 MG tablet Take 1 tablet (20 mg total) by mouth 2 (two) times daily. (Patient not taking: Reported on 08/26/2021) 30 tablet 0   thiamine 100 MG tablet Take 1 tablet (100 mg total) by mouth daily. (Patient not taking: Reported on 08/26/2021) 30 tablet 0    Labs  Lab Results:  Admission on 08/25/2021, Discharged on 08/26/2021  Component Date Value Ref Range Status   WBC 08/26/2021 3.9 (A)  4.0 - 10.5 K/uL Final   RBC 08/26/2021 4.45  4.22 - 5.81 MIL/uL Final   Hemoglobin 08/26/2021 15.1  13.0 - 17.0 g/dL Final   HCT 16/08/9603 44.0  39.0 - 52.0 % Final   MCV 08/26/2021 98.9  80.0 -  100.0 fL Final   MCH 08/26/2021 33.9  26.0 - 34.0 pg Final   MCHC 08/26/2021 34.3  30.0 - 36.0 g/dL Final   RDW 54/07/8118 16.2 (A)  11.5 - 15.5 % Final   Platelets 08/26/2021 143 (A)  150 - 400 K/uL Final   nRBC 08/26/2021 0.0  0.0 - 0.2 % Final   Neutrophils Relative % 08/26/2021 35  % Final   Neutro Abs 08/26/2021 1.4 (A)  1.7 - 7.7 K/uL Final   Lymphocytes Relative 08/26/2021 50  % Final   Lymphs Abs 08/26/2021 1.9  0.7 - 4.0 K/uL Final   Monocytes Relative 08/26/2021 11  % Final   Monocytes Absolute 08/26/2021 0.4  0.1 - 1.0 K/uL Final   Eosinophils Relative 08/26/2021 3  % Final   Eosinophils Absolute 08/26/2021 0.1  0.0 - 0.5 K/uL Final   Basophils Relative 08/26/2021 1  % Final   Basophils Absolute 08/26/2021 0.0  0.0 - 0.1 K/uL Final   Immature Granulocytes 08/26/2021 0  % Final   Abs Immature Granulocytes 08/26/2021 0.00  0.00 - 0.07 K/uL Final   Performed at [facility], [address], [place], [place] [address]   Alcohol, Ethyl (B) 08/26/2021 333 (A)  <10 mg/dL Final   Comment: CRITICAL RESULT CALLED TO, READ BACK BY AND VERIFIED WITH: MERRICKS LOU RN 08/26/21 @ 0308 VS (NOTE) Lowest detectable limit for serum alcohol is 10 mg/dL.  For medical purposes only. Performed at [facility], [address] [place], [place] [address]    Salicylate Lvl 08/26/2021 <7.0 (A)  7.0 - 30.0 mg/dL Final   Performed at [facility], [address] [place], [place] [address]   Acetaminophen (Tylenol), Serum 08/26/2021 1 (  A)  10 - 30 ug/mL Final   Performed at [facility], [address] [place], [place] [address]   Sodium 08/26/2021 138  135 - 145 mmol/L Final   Potassium 08/26/2021 3.7  3.5 - 5.1 mmol/L Final   Chloride 08/26/2021 101  98 - 111 mmol/L Final   CO2 08/26/2021 26  22 - 32 mmol/L Final   Glucose, Bld 08/26/2021 99  70 - 99 mg/dL Final   Glucose reference range applies only to samples taken  after fasting for at least 8 hours.   BUN 08/26/2021 6  6 - 20 mg/dL Final   Creatinine, Ser 08/26/2021 0.65  0.61 - 1.24 mg/dL Final   Calcium 56/43/3295 9.3  8.9 - 10.3 mg/dL Final   Total Protein 18/84/1660 8.0  6.5 - 8.1 g/dL Final   Albumin 63/11/6008 4.8  3.5 - 5.0 g/dL Final   AST 93/23/5573 56 (A)  15 - 41 U/L Final   ALT 08/26/2021 35  0 - 44 U/L Final   Alkaline Phosphatase 08/26/2021 56  38 - 126 U/L Final   Total Bilirubin 08/26/2021 0.5  0.3 - 1.2 mg/dL Final   GFR, Estimated 08/26/2021 >60  >60 mL/min Final   Comment: (NOTE) Calculated using the CKD-EPI Creatinine Equation (2021)    Anion gap 08/26/2021 11  5 - 15 Final   Performed at [facility], [address] [place], [place] [address]   Opiates 08/26/2021 NONE DETECTED  NONE DETECTED Final   Cocaine 08/26/2021 NONE DETECTED  NONE DETECTED Final   Benzodiazepines 08/26/2021 POSITIVE (A)  NONE DETECTED Final   Amphetamines 08/26/2021 NONE DETECTED  NONE DETECTED Final   Tetrahydrocannabinol 08/26/2021 NONE DETECTED  NONE DETECTED Final   Barbiturates 08/26/2021 NONE DETECTED  NONE DETECTED Final   Comment: (NOTE) DRUG SCREEN FOR MEDICAL PURPOSES ONLY.  IF CONFIRMATION IS NEEDED FOR ANY PURPOSE, NOTIFY LAB WITHIN 5 DAYS.  LOWEST DETECTABLE LIMITS FOR URINE DRUG SCREEN Drug Class                     Cutoff (ng/mL) Amphetamine and metabolites    1000 Barbiturate and metabolites    200 Benzodiazepine                 200 Tricyclics and metabolites     300 Opiates and metabolites        300 Cocaine and metabolites        300 THC                            50 Performed at [facility], [address], [place], [place] [address]    SARS Coronavirus 2 by RT PCR 08/26/2021 NEGATIVE  NEGATIVE Final   Comment: (NOTE) SARS-CoV-2 target nucleic acids are NOT DETECTED.  The SARS-CoV-2 RNA is generally detectable in upper respiratory specimens during the acute phase of infection.  The lowest concentration of SARS-CoV-2 viral copies this assay can detect is 138 copies/mL. A negative result does not preclude SARS-Cov-2 infection and should not be used as the sole basis for treatment or other patient management decisions. A negative result may occur with  improper specimen collection/handling, submission of specimen other than nasopharyngeal swab, presence of viral mutation(s) within the areas targeted by this assay, and inadequate number of viral copies(<138 copies/mL). A negative result must be combined with clinical observations, patient history, and epidemiological information. The expected result  is Negative.  Fact Sheet for Patients:  BloggerCourse.com  Fact Sheet for Healthcare Providers:  SeriousBroker.it  This test is no                          t yet approved or cleared by the [place] FDA and  has been authorized for detection and/or diagnosis of SARS-CoV-2 by FDA under an Emergency Use Authorization (EUA). This EUA will remain  in effect (meaning this test can be used) for the duration of the COVID-19 declaration under Section 564(b)(1) of the Act, 21 U.S.C.section 360bbb-3(b)(1), unless the authorization is terminated  or revoked sooner.       Influenza A by PCR 08/26/2021 NEGATIVE  NEGATIVE Final   Influenza B by PCR 08/26/2021 NEGATIVE  NEGATIVE Final   Comment: (NOTE) The Xpert Xpress SARS-CoV-2/FLU/RSV plus assay is intended as an aid in the diagnosis of influenza from Nasopharyngeal swab specimens and should not be used as a sole basis for treatment. Nasal washings and aspirates are unacceptable for Xpert Xpress SARS-CoV-2/FLU/RSV testing.  Fact Sheet for Patients: BloggerCourse.com  Fact Sheet for Healthcare Providers: SeriousBroker.it  This test is not yet approved or cleared by the [place] FDA and has been authorized for  detection and/or diagnosis of SARS-CoV-2 by FDA under an Emergency Use Authorization (EUA). This EUA will remain in effect (meaning this test can be used) for the duration of the COVID-19 declaration under Section 564(b)(1) of the Act, 21 U.S.C. section 360bbb-3(b)(1), unless the authorization is terminated or revoked.  Performed at [facility], [address], [place], [place] [address]   Admission on 08/08/2021, Discharged on 08/09/2021  Component Date Value Ref Range Status   WBC 08/08/2021 4.7  4.0 - 10.5 K/uL Final   RBC 08/08/2021 4.29  4.22 - 5.81 MIL/uL Final   Hemoglobin 08/08/2021 14.5  13.0 - 17.0 g/dL Final   HCT 91/47/8295 43.0  39.0 - 52.0 % Final   MCV 08/08/2021 100.2 (A)  80.0 - 100.0 fL Final   MCH 08/08/2021 33.8  26.0 - 34.0 pg Final   MCHC 08/08/2021 33.7  30.0 - 36.0 g/dL Final   RDW 62/13/0865 16.9 (A)  11.5 - 15.5 % Final   Platelets 08/08/2021 460 (A)  150 - 400 K/uL Final   nRBC 08/08/2021 0.0  0.0 - 0.2 % Final   Neutrophils Relative % 08/08/2021 45  % Final   Neutro Abs 08/08/2021 2.1  1.7 - 7.7 K/uL Final   Lymphocytes Relative 08/08/2021 45  % Final   Lymphs Abs 08/08/2021 2.2  0.7 - 4.0 K/uL Final   Monocytes Relative 08/08/2021 6  % Final   Monocytes Absolute 08/08/2021 0.3  0.1 - 1.0 K/uL Final   Eosinophils Relative 08/08/2021 2  % Final   Eosinophils Absolute 08/08/2021 0.1  0.0 - 0.5 K/uL Final   Basophils Relative 08/08/2021 2  % Final   Basophils Absolute 08/08/2021 0.1  0.0 - 0.1 K/uL Final   Immature Granulocytes 08/08/2021 0  % Final   Abs Immature Granulocytes 08/08/2021 0.00  0.00 - 0.07 K/uL Final   Performed at [facility], [address], [place], [place] [address]   Sodium 08/08/2021 138  135 - 145 mmol/L Final   Potassium 08/08/2021 3.6  3.5 - 5.1 mmol/L Final   Chloride 08/08/2021 101  98 - 111 mmol/L Final   CO2 08/08/2021 19 (A)  22 - 32 mmol/L Final  Glucose, Bld 08/08/2021 82  70 - 99 mg/dL Final    Glucose reference range applies only to samples taken after fasting for at least 8 hours.   BUN 08/08/2021 <5 (A)  6 - 20 mg/dL Final   Creatinine, Ser 08/08/2021 0.76  0.61 - 1.24 mg/dL Final   Calcium 16/08/9603 9.4  8.9 - 10.3 mg/dL Final   Total Protein 54/07/8118 7.8  6.5 - 8.1 g/dL Final   Albumin 14/78/2956 4.3  3.5 - 5.0 g/dL Final   AST 21/30/8657 46 (A)  15 - 41 U/L Final   ALT 08/08/2021 32  0 - 44 U/L Final   Alkaline Phosphatase 08/08/2021 68  38 - 126 U/L Final   Total Bilirubin 08/08/2021 0.7  0.3 - 1.2 mg/dL Final   GFR, Estimated 08/08/2021 >60  >60 mL/min Final   Comment: (NOTE) Calculated using the CKD-EPI Creatinine Equation (2021)    Anion gap 08/08/2021 18 (A)  5 - 15 Final   Performed at [facility], [address] [place], [place] [address]   Alcohol, Ethyl (B) 08/08/2021 281 (A)  <10 mg/dL Final   Comment: (NOTE) Lowest detectable limit for serum alcohol is 10 mg/dL.  For medical purposes only. Performed at [facility], [address] [place], [place] [address]    Lipase 08/08/2021 53 (A)  11 - 51 U/L Final   Performed at [facility], [name] [place], [place] [address]   Color, Urine 08/08/2021 YELLOW  YELLOW Final   APPearance 08/08/2021 CLEAR  CLEAR Final   Specific Gravity, Urine 08/08/2021 1.009  1.005 - 1.030 Final   pH 08/08/2021 5.0  5.0 - 8.0 Final   Glucose, UA 08/08/2021 NEGATIVE  NEGATIVE mg/dL Final   Hgb urine dipstick 08/08/2021 NEGATIVE  NEGATIVE Final   Bilirubin Urine 08/08/2021 NEGATIVE  NEGATIVE Final   Ketones, ur 08/08/2021 20 (A)  NEGATIVE mg/dL Final   Protein, ur 40/08/2724 NEGATIVE  NEGATIVE mg/dL Final   Nitrite 36/64/4034 NEGATIVE  NEGATIVE Final   Leukocytes,Ua 08/08/2021 NEGATIVE  NEGATIVE Final   Performed at [facility], [address], [place], [place] [address]  Admission on 08/05/2021, Discharged on 08/05/2021  Component Date Value Ref Range Status   Glucose-Capillary 08/05/2021  73  70 - 99 mg/dL Final   Glucose reference range applies only to samples taken after fasting for at least 8 hours.  Admission on 07/26/2021, Discharged on 07/31/2021  Component Date Value Ref Range Status   WBC 07/26/2021 2.7 (A)  4.0 - 10.5 K/uL Final   RBC 07/26/2021 3.94 (A)  4.22 - 5.81 MIL/uL Final   Hemoglobin 07/26/2021 13.7  13.0 - 17.0 g/dL Final   HCT 56/38/7564 40.3  39.0 - 52.0 % Final   MCV 07/26/2021 102.3 (A)  80.0 - 100.0 fL Final   MCH 07/26/2021 34.8 (A)  26.0 - 34.0 pg Final   MCHC 07/26/2021 34.0  30.0 - 36.0 g/dL Final   RDW 33/29/5188 17.0 (A)  11.5 - 15.5 % Final   Platelets 07/26/2021 143 (A)  150 - 400 K/uL Final   Comment: Immature Platelet Fraction may be clinically indicated, consider ordering this additional test [id number] REPEATED TO VERIFY    nRBC 07/26/2021 0.0  0.0 - 0.2 % Final   Performed at [facility], [address] [place], [place] [address]   Sodium 07/26/2021 135  135 - 145 mmol/L Final   Potassium 07/26/2021 3.3 (A)  3.5 -  5.1 mmol/L Final   Chloride 07/26/2021 99  98 - 111 mmol/L Final   CO2 07/26/2021 28  22 - 32 mmol/L Final   Glucose, Bld 07/26/2021 94  70 - 99 mg/dL Final   Glucose reference range applies only to samples taken after fasting for at least 8 hours.   BUN 07/26/2021 8  6 - 20 mg/dL Final   Creatinine, Ser 07/26/2021 0.57 (A)  0.61 - 1.24 mg/dL Final   Calcium 40/98/1191 9.6  8.9 - 10.3 mg/dL Final   Total Protein 47/82/9562 7.4  6.5 - 8.1 g/dL Final   Albumin 13/06/6577 4.1  3.5 - 5.0 g/dL Final   AST 46/96/2952 34  15 - 41 U/L Final   ALT 07/26/2021 24  0 - 44 U/L Final   Alkaline Phosphatase 07/26/2021 75  38 - 126 U/L Final   Total Bilirubin 07/26/2021 1.7 (A)  0.3 - 1.2 mg/dL Final   GFR, Estimated 07/26/2021 >60  >60 mL/min Final   Comment: (NOTE) Calculated using the CKD-EPI Creatinine Equation (2021)    Anion gap 07/26/2021 8  5 - 15 Final   Performed at [facility],  [address], [place], [place] [address]   Cholesterol 07/26/2021 198  0 - 200 mg/dL Final   Triglycerides 44/11/270 209 (A)  <150 mg/dL Final   HDL 53/66/4403 52  >40 mg/dL Final   Total CHOL/HDL Ratio 07/26/2021 3.8  RATIO Final   VLDL 07/26/2021 42 (A)  0 - 40 mg/dL Final   LDL Cholesterol 07/26/2021 104 (A)  0 - 99 mg/dL Final   Comment:        Total Cholesterol/HDL:CHD Risk Coronary Heart Disease Risk Table                     Men   Women  1/2 Average Risk   3.4   3.3  Average Risk       5.0   4.4  2 X Average Risk   9.6   7.1  3 X Average Risk  23.4   11.0        Use the calculated Patient Ratio above and the CHD Risk Table to determine the patient's CHD Risk.        ATP III CLASSIFICATION (LDL):  <100     mg/dL   Optimal  474-259  mg/dL   Near or Above                    Optimal  130-159  mg/dL   Borderline  563-875  mg/dL   High  >643     mg/dL   Very High Performed at [facility], [address], [place], [place] [address]    Total Protein 07/26/2021 7.5  6.5 - 8.1 g/dL Final   Albumin 88/41/6606 4.0  3.5 - 5.0 g/dL Final   AST 30/16/0109 35  15 - 41 U/L Final   ALT 07/26/2021 24  0 - 44 U/L Final   Alkaline Phosphatase 07/26/2021 75  38 - 126 U/L Final   Total Bilirubin 07/26/2021 1.7 (A)  0.3 - 1.2 mg/dL Final   Bilirubin, Direct 07/26/2021 0.3 (A)  0.0 - 0.2 mg/dL Final   Indirect Bilirubin 07/26/2021 1.4 (A)  0.3 - 0.9 mg/dL Final   Performed at [facility], [address] [place], [place] [address]   Hgb A1c MFr Bld 07/26/2021 4.3 (A)  4.8 - 5.6 % Final  Comment: (NOTE) Pre diabetes:          5.7%-6.4%  Diabetes:              >6.4%  Glycemic control for   <7.0% adults with diabetes    Mean Plasma Glucose 07/26/2021 76.71  mg/dL Final   Performed at [facility], [address], [place], [place] [address]   WBC 07/26/2021 3.1 (A)  4.0 - 10.5 K/uL Final   RBC 07/26/2021 4.03 (A)  4.22 - 5.81 MIL/uL Final    Hemoglobin 07/26/2021 13.7  13.0 - 17.0 g/dL Final   HCT 60/45/4098 41.2  39.0 - 52.0 % Final   MCV 07/26/2021 102.2 (A)  80.0 - 100.0 fL Final   MCH 07/26/2021 34.0  26.0 - 34.0 pg Final   MCHC 07/26/2021 33.3  30.0 - 36.0 g/dL Final   RDW 11/91/4782 17.0 (A)  11.5 - 15.5 % Final   Platelets 07/26/2021 133 (A)  150 - 400 K/uL Final   nRBC 07/26/2021 0.0  0.0 - 0.2 % Final   Neutrophils Relative % 07/26/2021 68  % Final   Neutro Abs 07/26/2021 2.1  1.7 - 7.7 K/uL Final   Lymphocytes Relative 07/26/2021 22  % Final   Lymphs Abs 07/26/2021 0.7  0.7 - 4.0 K/uL Final   Monocytes Relative 07/26/2021 6  % Final   Monocytes Absolute 07/26/2021 0.2  0.1 - 1.0 K/uL Final   Eosinophils Relative 07/26/2021 3  % Final   Eosinophils Absolute 07/26/2021 0.1  0.0 - 0.5 K/uL Final   Basophils Relative 07/26/2021 1  % Final   Basophils Absolute 07/26/2021 0.0  0.0 - 0.1 K/uL Final   Immature Granulocytes 07/26/2021 0  % Final   Abs Immature Granulocytes 07/26/2021 0.01  0.00 - 0.07 K/uL Final   Performed at [facility], [address], [place], [place] [address]   Sodium 07/26/2021 138  135 - 145 mmol/L Final   Potassium 07/26/2021 3.5  3.5 - 5.1 mmol/L Final   Chloride 07/26/2021 101  98 - 111 mmol/L Final   CO2 07/26/2021 28  22 - 32 mmol/L Final   Glucose, Bld 07/26/2021 102 (A)  70 - 99 mg/dL Final   Glucose reference range applies only to samples taken after fasting for at least 8 hours.   BUN 07/26/2021 9  6 - 20 mg/dL Final   Creatinine, Ser 07/26/2021 0.72  0.61 - 1.24 mg/dL Final   Calcium 30/86/5784 9.9  8.9 - 10.3 mg/dL Final   Total Protein 69/62/9528 7.6  6.5 - 8.1 g/dL Final   Albumin 41/32/4401 4.3  3.5 - 5.0 g/dL Final   AST 02/72/5366 37  15 - 41 U/L Final   ALT 07/26/2021 23  0 - 44 U/L Final   Alkaline Phosphatase 07/26/2021 74  38 - 126 U/L Final   Total Bilirubin 07/26/2021 1.6 (A)  0.3 - 1.2 mg/dL Final   GFR, Estimated 07/26/2021 >60  >60 mL/min Final    Comment: (NOTE) Calculated using the CKD-EPI Creatinine Equation (2021)    Anion gap 07/26/2021 9  5 - 15 Final   Performed at [facility], [address], [place], [place] [address]   Magnesium 07/26/2021 1.6 (A)  1.7 - 2.4 mg/dL Final   Performed at [facility], [address], [place], [place] [address]   HIV 1 RNA Quant 07/26/2021 WRORD  copies/mL Corrected   Comment: (NOTE) Test not performed. The required specimen  for the test ordered was not received. Received frozen whole blood Requires frozen plasma 07/28/2021 The reportable range for this assay is 20 to 10,000,000 copies HIV-1 RNA/mL.    TSH 07/26/2021 2.079  0.350 - 4.500 uIU/mL Final   Comment: Performed by a 3rd Generation assay with a functional sensitivity of <=0.01 uIU/mL. Performed at [facility], [address], [place], [place] [address]    Sodium 07/28/2021 134 (A)  135 - 145 mmol/L Final   Potassium 07/28/2021 4.1  3.5 - 5.1 mmol/L Final   Chloride 07/28/2021 99  98 - 111 mmol/L Final   CO2 07/28/2021 26  22 - 32 mmol/L Final   Glucose, Bld 07/28/2021 96  70 - 99 mg/dL Final   Glucose reference range applies only to samples taken after fasting for at least 8 hours.   BUN 07/28/2021 11  6 - 20 mg/dL Final   Creatinine, Ser 07/28/2021 0.74  0.61 - 1.24 mg/dL Final   Calcium 60/45/4098 9.2  8.9 - 10.3 mg/dL Final   GFR, Estimated 07/28/2021 >60  >60 mL/min Final   Comment: (NOTE) Calculated using the CKD-EPI Creatinine Equation (2021)    Anion gap 07/28/2021 9  5 - 15 Final   Performed at [facility], [address], [place], [place] [address]   WBC 07/28/2021 3.6 (A)  4.0 - 10.5 K/uL Final   RBC 07/28/2021 3.87 (A)  4.22 - 5.81 MIL/uL Final   Hemoglobin 07/28/2021 13.3  13.0 - 17.0 g/dL Final   HCT 78/29/5621 40.0  39.0 - 52.0 % Final   MCV 07/28/2021 103.4 (A)  80.0 - 100.0 fL Final   MCH 07/28/2021 34.4 (A)  26.0 - 34.0 pg  Final   MCHC 07/28/2021 33.3  30.0 - 36.0 g/dL Final   RDW 30/86/5784 17.2 (A)  11.5 - 15.5 % Final   Platelets 07/28/2021 140 (A)  150 - 400 K/uL Final   nRBC 07/28/2021 0.0  0.0 - 0.2 % Final   Neutrophils Relative % 07/28/2021 59  % Final   Neutro Abs 07/28/2021 2.1  1.7 - 7.7 K/uL Final   Lymphocytes Relative 07/28/2021 28  % Final   Lymphs Abs 07/28/2021 1.0  0.7 - 4.0 K/uL Final   Monocytes Relative 07/28/2021 9  % Final   Monocytes Absolute 07/28/2021 0.3  0.1 - 1.0 K/uL Final   Eosinophils Relative 07/28/2021 3  % Final   Eosinophils Absolute 07/28/2021 0.1  0.0 - 0.5 K/uL Final   Basophils Relative 07/28/2021 1  % Final   Basophils Absolute 07/28/2021 0.0  0.0 - 0.1 K/uL Final   Immature Granulocytes 07/28/2021 0  % Final   Abs Immature Granulocytes 07/28/2021 0.01  0.00 - 0.07 K/uL Final   Performed at [facility], [address] [place], [place] [address]   Magnesium 07/28/2021 2.1  1.7 - 2.4 mg/dL Final   Performed at [facility], [address] [place], [place] [address]   HIV 1 RNA Quant 07/30/2021 40  copies/mL Corrected   Comment: (NOTE) The reportable range for this assay is 20 to 10,000,000 copies HIV-1 RNA/mL.    LOG10 HIV-1 RNA 07/30/2021 1.602  log10copy/mL Final   Comment: (NOTE) Performed At: [facility] [address] [place], [place] [id number] Jolene Schimke MD OZ:[id number]   Admission on 07/24/2021, Discharged on 07/26/2021  Component Date Value Ref Range Status   WBC 07/24/2021 2.8 (A)  4.0 - 10.5 K/uL Final   RBC  07/24/2021 4.00 (A)  4.22 - 5.81 MIL/uL Final   Hemoglobin 07/24/2021 13.5  13.0 - 17.0 g/dL Final   HCT 16/08/9603 40.3  39.0 - 52.0 % Final   MCV 07/24/2021 100.8 (A)  80.0 - 100.0 fL Final   MCH 07/24/2021 33.8  26.0 - 34.0 pg Final   MCHC 07/24/2021 33.5  30.0 - 36.0 g/dL Final   RDW 54/07/8118 17.5 (A)  11.5 - 15.5 % Final   Platelets 07/24/2021 180  150 - 400 K/uL Final   nRBC  07/24/2021 0.0  0.0 - 0.2 % Final   Neutrophils Relative % 07/24/2021 37  % Final   Neutro Abs 07/24/2021 1.0 (A)  1.7 - 7.7 K/uL Final   Lymphocytes Relative 07/24/2021 49  % Final   Lymphs Abs 07/24/2021 1.4  0.7 - 4.0 K/uL Final   Monocytes Relative 07/24/2021 9  % Final   Monocytes Absolute 07/24/2021 0.2  0.1 - 1.0 K/uL Final   Eosinophils Relative 07/24/2021 4  % Final   Eosinophils Absolute 07/24/2021 0.1  0.0 - 0.5 K/uL Final   Basophils Relative 07/24/2021 1  % Final   Basophils Absolute 07/24/2021 0.0  0.0 - 0.1 K/uL Final   Immature Granulocytes 07/24/2021 0  % Final   Abs Immature Granulocytes 07/24/2021 0.00  0.00 - 0.07 K/uL Final   Performed at [facility], [address] [place], [place] [address]   Sodium 07/24/2021 139  135 - 145 mmol/L Final   Potassium 07/24/2021 3.7  3.5 - 5.1 mmol/L Final   Chloride 07/24/2021 101  98 - 111 mmol/L Final   CO2 07/24/2021 26  22 - 32 mmol/L Final   Glucose, Bld 07/24/2021 98  70 - 99 mg/dL Final   Glucose reference range applies only to samples taken after fasting for at least 8 hours.   BUN 07/24/2021 5 (A)  6 - 20 mg/dL Final   Creatinine, Ser 07/24/2021 0.64  0.61 - 1.24 mg/dL Final   Calcium 95/62/1308 9.0  8.9 - 10.3 mg/dL Final   Total Protein 65/78/4696 7.3  6.5 - 8.1 g/dL Final   Albumin 29/52/8413 4.2  3.5 - 5.0 g/dL Final   AST 24/40/1027 41  15 - 41 U/L Final   ALT 07/24/2021 27  0 - 44 U/L Final   Alkaline Phosphatase 07/24/2021 75  38 - 126 U/L Final   Total Bilirubin 07/24/2021 1.0  0.3 - 1.2 mg/dL Final   GFR, Estimated 07/24/2021 >60  >60 mL/min Final   Comment: (NOTE) Calculated using the CKD-EPI Creatinine Equation (2021)    Anion gap 07/24/2021 12  5 - 15 Final   Performed at [facility], [address] [place], [place] [address]   Alcohol, Ethyl (B) 07/24/2021 231 (A)  <10 mg/dL Final   Comment: (NOTE) Lowest detectable limit for serum alcohol is 10 mg/dL.  For  medical purposes only. Performed at [facility], [address] [place], [place] [address]    Salicylate Lvl 07/24/2021 <7.0 (A)  7.0 - 30.0 mg/dL Final   Performed at [facility], [address], [place], [place] [address]   Acetaminophen (Tylenol), Serum 07/24/2021 <10 (A)  10 - 30 ug/mL Final   Comment: (NOTE) Therapeutic concentrations vary significantly. A range of 10-30 ug/mL  may be an effective concentration for many patients. However, some  are best treated at concentrations outside of this range. Acetaminophen concentrations >150 ug/mL at 4 hours after ingestion  and >50 ug/mL at 12 hours after ingestion are often associated with  toxic reactions.  Performed at [facility], [address] [place], [place] [address]    Opiates 07/24/2021 NONE DETECTED  NONE DETECTED Final   Cocaine 07/24/2021 NONE DETECTED  NONE DETECTED Final   Benzodiazepines 07/24/2021 POSITIVE (A)  NONE DETECTED Final   Amphetamines 07/24/2021 NONE DETECTED  NONE DETECTED Final   Tetrahydrocannabinol 07/24/2021 NONE DETECTED  NONE DETECTED Final   Barbiturates 07/24/2021 NONE DETECTED  NONE DETECTED Final   Comment: (NOTE) DRUG SCREEN FOR MEDICAL PURPOSES ONLY.  IF CONFIRMATION IS NEEDED FOR ANY PURPOSE, NOTIFY LAB WITHIN 5 DAYS.  LOWEST DETECTABLE LIMITS FOR URINE DRUG SCREEN Drug Class                     Cutoff (ng/mL) Amphetamine and metabolites    1000 Barbiturate and metabolites    200 Benzodiazepine                 200 Tricyclics and metabolites     300 Opiates and metabolites        300 Cocaine and metabolites        300 THC                            50 Performed at [facility], [address], [place], [place] [address]    SARS Coronavirus 2 by RT PCR 07/24/2021 NEGATIVE  NEGATIVE Final   Comment: (NOTE) SARS-CoV-2 target nucleic acids are NOT DETECTED.  The SARS-CoV-2 RNA is generally detectable in  upper respiratory specimens during the acute phase of infection. The lowest concentration of SARS-CoV-2 viral copies this assay can detect is 138 copies/mL. A negative result does not preclude SARS-Cov-2 infection and should not be used as the sole basis for treatment or other patient management decisions. A negative result may occur with  improper specimen collection/handling, submission of specimen other than nasopharyngeal swab, presence of viral mutation(s) within the areas targeted by this assay, and inadequate number of viral copies(<138 copies/mL). A negative result must be combined with clinical observations, patient history, and epidemiological information. The expected result is Negative.  Fact Sheet for Patients:  BloggerCourse.com  Fact Sheet for Healthcare Providers:  SeriousBroker.it  This test is no                          t yet approved or cleared by the [place] FDA and  has been authorized for detection and/or diagnosis of SARS-CoV-2 by FDA under an Emergency Use Authorization (EUA). This EUA will remain  in effect (meaning this test can be used) for the duration of the COVID-19 declaration under Section 564(b)(1) of the Act, 21 U.S.C.section 360bbb-3(b)(1), unless the authorization is terminated  or revoked sooner.       Influenza A by PCR 07/24/2021 NEGATIVE  NEGATIVE Final   Influenza B by PCR 07/24/2021 NEGATIVE  NEGATIVE Final   Comment: (NOTE) The Xpert Xpress SARS-CoV-2/FLU/RSV plus assay is intended as an aid in the diagnosis of influenza from Nasopharyngeal swab specimens and should not be used as a sole basis for treatment. Nasal washings and aspirates are unacceptable for Xpert Xpress SARS-CoV-2/FLU/RSV testing.  Fact Sheet for Patients: BloggerCourse.com  Fact Sheet for Healthcare Providers: SeriousBroker.it  This test is not yet approved  or cleared by the [place] and has been authorized  for detection and/or diagnosis of SARS-CoV-2 by FDA under an Emergency Use Authorization (EUA). This EUA will remain in effect (meaning this test can be used) for the duration of the COVID-19 declaration under Section 564(b)(1) of the Act, 21 U.S.C. section 360bbb-3(b)(1), unless the authorization is terminated or revoked.  Performed at [facility], [address], [place], [place] [address]    Troponin I (High Sensitivity) 07/25/2021 4  <18 ng/L Final   Comment: (NOTE) Elevated high sensitivity troponin I (hsTnI) values and significant  changes across serial measurements may suggest ACS but many other  chronic and acute conditions are known to elevate hsTnI results.  Refer to the "Links" section for chest pain algorithms and additional  guidance. Performed at [facility], [address] [place], [place] [address]   Admission on 07/20/2021, Discharged on 07/21/2021  Component Date Value Ref Range Status   Alcohol, Ethyl (B) 07/20/2021 308 (A)  <10 mg/dL Final   Comment: CRITICAL RESULT CALLED TO, READ BACK BY AND VERIFIED WITH: NASH, J @1931  BY BATTLET (NOTE) Lowest detectable limit for serum alcohol is 10 mg/dL.  For medical purposes only. Performed at [facility], [address] [place], [place] [address]    WBC 07/20/2021 3.6 (A)  4.0 - 10.5 K/uL Final   RBC 07/20/2021 4.28  4.22 - 5.81 MIL/uL Final   Hemoglobin 07/20/2021 14.4  13.0 - 17.0 g/dL Final   HCT 91/47/8295 42.8  39.0 - 52.0 % Final   MCV 07/20/2021 100.0  80.0 - 100.0 fL Final   MCH 07/20/2021 33.6  26.0 - 34.0 pg Final   MCHC 07/20/2021 33.6  30.0 - 36.0 g/dL Final   RDW 62/13/0865 17.3 (A)  11.5 - 15.5 % Final   Platelets 07/20/2021 274  150 - 400 K/uL Final   nRBC 07/20/2021 0.0  0.0 - 0.2 % Final   Neutrophils Relative % 07/20/2021 42  % Final   Neutro Abs 07/20/2021 1.5 (A)  1.7 -  7.7 K/uL Final   Lymphocytes Relative 07/20/2021 43  % Final   Lymphs Abs 07/20/2021 1.6  0.7 - 4.0 K/uL Final   Monocytes Relative 07/20/2021 9  % Final   Monocytes Absolute 07/20/2021 0.3  0.1 - 1.0 K/uL Final   Eosinophils Relative 07/20/2021 4  % Final   Eosinophils Absolute 07/20/2021 0.1  0.0 - 0.5 K/uL Final   Basophils Relative 07/20/2021 2  % Final   Basophils Absolute 07/20/2021 0.1  0.0 - 0.1 K/uL Final   Immature Granulocytes 07/20/2021 0  % Final   Abs Immature Granulocytes 07/20/2021 0.01  0.00 - 0.07 K/uL Final   Performed at [facility], [address] [place], [place] [address]   Sodium 07/20/2021 138  135 - 145 mmol/L Final   Potassium 07/20/2021 3.6  3.5 - 5.1 mmol/L Final   Chloride 07/20/2021 101  98 - 111 mmol/L Final   CO2 07/20/2021 26  22 - 32 mmol/L Final   Glucose, Bld 07/20/2021 97  70 - 99 mg/dL Final   Glucose reference range applies only to samples taken after fasting for at least 8 hours.   BUN 07/20/2021 5 (A)  6 - 20 mg/dL Final   Creatinine, Ser 07/20/2021 0.61  0.61 - 1.24 mg/dL Final   Calcium 62/95/2841 8.9  8.9 - 10.3 mg/dL Final   Total Protein 32/44/0102 8.1  6.5 - 8.1 g/dL Final   Albumin 72/53/6644 4.3  3.5 - 5.0 g/dL  Final   AST 07/20/2021 52 (A)  15 - 41 U/L Final   ALT 07/20/2021 35  0 - 44 U/L Final   Alkaline Phosphatase 07/20/2021 89  38 - 126 U/L Final   Total Bilirubin 07/20/2021 0.9  0.3 - 1.2 mg/dL Final   GFR, Estimated 07/20/2021 >60  >60 mL/min Final   Comment: (NOTE) Calculated using the CKD-EPI Creatinine Equation (2021)    Anion gap 07/20/2021 11  5 - 15 Final   Performed at [facility], [address] [place], [place] [address]   Lipase 07/20/2021 40  11 - 51 U/L Final   Performed at [facility], [address] [place], [place] [address]   SARS Coronavirus 2 by RT PCR 07/21/2021 NEGATIVE  NEGATIVE Final   Comment: (NOTE) SARS-CoV-2 target nucleic acids are  NOT DETECTED.  The SARS-CoV-2 RNA is generally detectable in upper respiratory specimens during the acute phase of infection. The lowest concentration of SARS-CoV-2 viral copies this assay can detect is 138 copies/mL. A negative result does not preclude SARS-Cov-2 infection and should not be used as the sole basis for treatment or other patient management decisions. A negative result may occur with  improper specimen collection/handling, submission of specimen other than nasopharyngeal swab, presence of viral mutation(s) within the areas targeted by this assay, and inadequate number of viral copies(<138 copies/mL). A negative result must be combined with clinical observations, patient history, and epidemiological information. The expected result is Negative.  Fact Sheet for Patients:  BloggerCourse.com  Fact Sheet for Healthcare Providers:  SeriousBroker.it  This test is no                          t yet approved or cleared by the [place] FDA and  has been authorized for detection and/or diagnosis of SARS-CoV-2 by FDA under an Emergency Use Authorization (EUA). This EUA will remain  in effect (meaning this test can be used) for the duration of the COVID-19 declaration under Section 564(b)(1) of the Act, 21 U.S.C.section 360bbb-3(b)(1), unless the authorization is terminated  or revoked sooner.       Influenza A by PCR 07/21/2021 NEGATIVE  NEGATIVE Final   Influenza B by PCR 07/21/2021 NEGATIVE  NEGATIVE Final   Comment: (NOTE) The Xpert Xpress SARS-CoV-2/FLU/RSV plus assay is intended as an aid in the diagnosis of influenza from Nasopharyngeal swab specimens and should not be used as a sole basis for treatment. Nasal washings and aspirates are unacceptable for Xpert Xpress SARS-CoV-2/FLU/RSV testing.  Fact Sheet for Patients: BloggerCourse.com  Fact Sheet for Healthcare  Providers: SeriousBroker.it  This test is not yet approved or cleared by the [place] FDA and has been authorized for detection and/or diagnosis of SARS-CoV-2 by FDA under an Emergency Use Authorization (EUA). This EUA will remain in effect (meaning this test can be used) for the duration of the COVID-19 declaration under Section 564(b)(1) of the Act, 21 U.S.C. section 360bbb-3(b)(1), unless the authorization is terminated or revoked.  Performed at [facility], [address] [place], [place] [address]   Admission on 07/11/2021, Discharged on 07/13/2021  Component Date Value Ref Range Status   SARS Coronavirus 2 by RT PCR 07/11/2021 NEGATIVE  NEGATIVE Final   Comment: (NOTE) SARS-CoV-2 target nucleic acids are NOT DETECTED.  The SARS-CoV-2 RNA is generally detectable in upper respiratory specimens during the acute phase of infection. The lowest concentration of SARS-CoV-2 viral copies this  assay can detect is 138 copies/mL. A negative result does not preclude SARS-Cov-2 infection and should not be used as the sole basis for treatment or other patient management decisions. A negative result may occur with  improper specimen collection/handling, submission of specimen other than nasopharyngeal swab, presence of viral mutation(s) within the areas targeted by this assay, and inadequate number of viral copies(<138 copies/mL). A negative result must be combined with clinical observations, patient history, and epidemiological information. The expected result is Negative.  Fact Sheet for Patients:  BloggerCourse.com  Fact Sheet for Healthcare Providers:  SeriousBroker.it  This test is no                          t yet approved or cleared by the [place] FDA and  has been authorized for detection and/or diagnosis of SARS-CoV-2 by FDA under an Emergency Use Authorization (EUA).  This EUA will remain  in effect (meaning this test can be used) for the duration of the COVID-19 declaration under Section 564(b)(1) of the Act, 21 U.S.C.section 360bbb-3(b)(1), unless the authorization is terminated  or revoked sooner.       Influenza A by PCR 07/11/2021 NEGATIVE  NEGATIVE Final   Influenza B by PCR 07/11/2021 NEGATIVE  NEGATIVE Final   Comment: (NOTE) The Xpert Xpress SARS-CoV-2/FLU/RSV plus assay is intended as an aid in the diagnosis of influenza from Nasopharyngeal swab specimens and should not be used as a sole basis for treatment. Nasal washings and aspirates are unacceptable for Xpert Xpress SARS-CoV-2/FLU/RSV testing.  Fact Sheet for Patients: BloggerCourse.com  Fact Sheet for Healthcare Providers: SeriousBroker.it  This test is not yet approved or cleared by the [place] FDA and has been authorized for detection and/or diagnosis of SARS-CoV-2 by FDA under an Emergency Use Authorization (EUA). This EUA will remain in effect (meaning this test can be used) for the duration of the COVID-19 declaration under Section 564(b)(1) of the Act, 21 U.S.C. section 360bbb-3(b)(1), unless the authorization is terminated or revoked.  Performed at [facility], [address] [place], [place] [address]    Sodium 07/11/2021 139  135 - 145 mmol/L Final   Potassium 07/11/2021 3.8  3.5 - 5.1 mmol/L Final   Chloride 07/11/2021 103  98 - 111 mmol/L Final   CO2 07/11/2021 24  22 - 32 mmol/L Final   Glucose, Bld 07/11/2021 99  70 - 99 mg/dL Final   Glucose reference range applies only to samples taken after fasting for at least 8 hours.   BUN 07/11/2021 <5 (A)  6 - 20 mg/dL Final   Creatinine, Ser 07/11/2021 0.68  0.61 - 1.24 mg/dL Final   Calcium 40/98/1191 8.3 (A)  8.9 - 10.3 mg/dL Final   Total Protein 47/82/9562 6.8  6.5 - 8.1 g/dL Final   Albumin 13/06/6577 3.5  3.5 - 5.0 g/dL Final   AST 46/96/2952 58  (A)  15 - 41 U/L Final   ALT 07/11/2021 42  0 - 44 U/L Final   Alkaline Phosphatase 07/11/2021 85  38 - 126 U/L Final   Total Bilirubin 07/11/2021 0.9  0.3 - 1.2 mg/dL Final   GFR, Estimated 07/11/2021 >60  >60 mL/min Final   Comment: (NOTE) Calculated using the CKD-EPI Creatinine Equation (2021)    Anion gap 07/11/2021 12  5 - 15 Final   Performed at [facility], [address] [place], [place] [address]   Alcohol, Ethyl (B) 07/11/2021 388 (  A)  <10 mg/dL Final   Comment: CRITICAL RESULT CALLED TO, READ BACK BY AND VERIFIED WITH: Teryl Lucy RN 07/11/21 1516 M KOROLESKI (NOTE) Lowest detectable limit for serum alcohol is 10 mg/dL.  For medical purposes only. Performed at [facility], [address], [place], [place] [address]    Opiates 07/11/2021 NONE DETECTED  NONE DETECTED Final   Cocaine 07/11/2021 NONE DETECTED  NONE DETECTED Final   Benzodiazepines 07/11/2021 POSITIVE (A)  NONE DETECTED Final   Amphetamines 07/11/2021 NONE DETECTED  NONE DETECTED Final   Tetrahydrocannabinol 07/11/2021 NONE DETECTED  NONE DETECTED Final   Barbiturates 07/11/2021 NONE DETECTED  NONE DETECTED Final   Comment: (NOTE) DRUG SCREEN FOR MEDICAL PURPOSES ONLY.  IF CONFIRMATION IS NEEDED FOR ANY PURPOSE, NOTIFY LAB WITHIN 5 DAYS.  LOWEST DETECTABLE LIMITS FOR URINE DRUG SCREEN Drug Class                     Cutoff (ng/mL) Amphetamine and metabolites    1000 Barbiturate and metabolites    200 Benzodiazepine                 200 Tricyclics and metabolites     300 Opiates and metabolites        300 Cocaine and metabolites        300 THC                            50 Performed at [facility], [address], [place], [place] [address]    WBC 07/11/2021 4.2  4.0 - 10.5 K/uL Final   RBC 07/11/2021 3.43 (A)  4.22 - 5.81 MIL/uL Final   Hemoglobin 07/11/2021 11.5 (A)  13.0 - 17.0 g/dL Final   HCT 09/81/1914 33.9 (A)  39.0 - 52.0 % Final   MCV 07/11/2021 98.8  80.0 - 100.0 fL  Final   MCH 07/11/2021 33.5  26.0 - 34.0 pg Final   MCHC 07/11/2021 33.9  30.0 - 36.0 g/dL Final   RDW 78/29/5621 16.4 (A)  11.5 - 15.5 % Final   Platelets 07/11/2021 148 (A)  150 - 400 K/uL Final   nRBC 07/11/2021 0.0  0.0 - 0.2 % Final   Neutrophils Relative % 07/11/2021 60  % Final   Neutro Abs 07/11/2021 2.5  1.7 - 7.7 K/uL Final   Lymphocytes Relative 07/11/2021 27  % Final   Lymphs Abs 07/11/2021 1.1  0.7 - 4.0 K/uL Final   Monocytes Relative 07/11/2021 9  % Final   Monocytes Absolute 07/11/2021 0.4  0.1 - 1.0 K/uL Final   Eosinophils Relative 07/11/2021 3  % Final   Eosinophils Absolute 07/11/2021 0.1  0.0 - 0.5 K/uL Final   Basophils Relative 07/11/2021 1  % Final   Basophils Absolute 07/11/2021 0.1  0.0 - 0.1 K/uL Final   Immature Granulocytes 07/11/2021 0  % Final   Abs Immature Granulocytes 07/11/2021 0.01  0.00 - 0.07 K/uL Final   Performed at [facility], [address], [place], [place] [address]  Admission on 07/07/2021, Discharged on 07/08/2021  Component Date Value Ref Range Status   Alcohol, Ethyl (B) 07/07/2021 352 (A)  <10 mg/dL Final   Comment: CRITICAL RESULT CALLED TO, READ BACK BY AND VERIFIED WITH: RSherrilee Gilles, RN. @2341  [id number] BLANKENSHIP R.  (NOTE) Lowest detectable limit for serum alcohol is 10 mg/dL.  For medical purposes only. Performed at [facility], [address], [place], [place] [address]    Sodium 07/07/2021 138  135 - 145 mmol/L Final   Potassium 07/07/2021 3.5  3.5 - 5.1 mmol/L Final   Chloride 07/07/2021 102  98 - 111 mmol/L Final   CO2 07/07/2021 24  22 - 32 mmol/L Final   Glucose, Bld 07/07/2021 108 (A)  70 - 99 mg/dL Final   Glucose reference range applies only to samples taken after fasting for at least 8 hours.   BUN 07/07/2021 <5 (A)  6 - 20 mg/dL Final   Creatinine, Ser 07/07/2021 0.63  0.61 - 1.24 mg/dL Final   Calcium 60/45/4098 8.8 (A)  8.9 - 10.3 mg/dL Final   Total Protein 11/91/4782 7.0  6.5 - 8.1 g/dL Final    Albumin 95/62/1308 3.5  3.5 - 5.0 g/dL Final   AST 65/78/4696 69 (A)  15 - 41 U/L Final   ALT 07/07/2021 48 (A)  0 - 44 U/L Final   Alkaline Phosphatase 07/07/2021 68  38 - 126 U/L Final   Total Bilirubin 07/07/2021 0.7  0.3 - 1.2 mg/dL Final   GFR, Estimated 07/07/2021 >60  >60 mL/min Final   Comment: (NOTE) Calculated using the CKD-EPI Creatinine Equation (2021)    Anion gap 07/07/2021 12  5 - 15 Final   Performed at [facility], [address], [place], [place] [address]   Lipase 07/07/2021 54 (A)  11 - 51 U/L Final   Performed at [facility], [address] [place], [place] [address]   WBC 07/07/2021 4.3  4.0 - 10.5 K/uL Final   RBC 07/07/2021 3.58 (A)  4.22 - 5.81 MIL/uL Final   Hemoglobin 07/07/2021 12.0 (A)  13.0 - 17.0 g/dL Final   HCT 40/08/2724 34.4 (A)  39.0 - 52.0 % Final   MCV 07/07/2021 96.1  80.0 - 100.0 fL Final   MCH 07/07/2021 33.5  26.0 - 34.0 pg Final   MCHC 07/07/2021 34.9  30.0 - 36.0 g/dL Final   RDW 36/64/4034 14.6  11.5 - 15.5 % Final   Platelets 07/07/2021 134 (A)  150 - 400 K/uL Final   nRBC 07/07/2021 0.0  0.0 - 0.2 % Final   Neutrophils Relative % 07/07/2021 44  % Final   Neutro Abs 07/07/2021 1.9  1.7 - 7.7 K/uL Final   Lymphocytes Relative 07/07/2021 44  % Final   Lymphs Abs 07/07/2021 1.9  0.7 - 4.0 K/uL Final   Monocytes Relative 07/07/2021 9  % Final   Monocytes Absolute 07/07/2021 0.4  0.1 - 1.0 K/uL Final   Eosinophils Relative 07/07/2021 2  % Final   Eosinophils Absolute 07/07/2021 0.1  0.0 - 0.5 K/uL Final   Basophils Relative 07/07/2021 1  % Final   Basophils Absolute 07/07/2021 0.0  0.0 - 0.1 K/uL Final   Immature Granulocytes 07/07/2021 0  % Final   Abs Immature Granulocytes 07/07/2021 0.01  0.00 - 0.07 K/uL Final   Performed at [facility], [address] [place], [place] [address]   Opiates 07/07/2021 NONE DETECTED  NONE DETECTED Final   Cocaine 07/07/2021 NONE DETECTED  NONE DETECTED Final   Benzodiazepines  07/07/2021 NONE DETECTED  NONE DETECTED Final   Amphetamines 07/07/2021 POSITIVE (A)  NONE DETECTED Final   Tetrahydrocannabinol 07/07/2021 NONE DETECTED  NONE DETECTED Final   Barbiturates 07/07/2021 NONE DETECTED  NONE DETECTED Final   Comment: (NOTE) DRUG SCREEN FOR MEDICAL PURPOSES ONLY.  IF CONFIRMATION IS NEEDED FOR  ANY PURPOSE, NOTIFY LAB WITHIN 5 DAYS.  LOWEST DETECTABLE LIMITS FOR URINE DRUG SCREEN Drug Class                     Cutoff (ng/mL) Amphetamine and metabolites    1000 Barbiturate and metabolites    200 Benzodiazepine                 200 Tricyclics and metabolites     300 Opiates and metabolites        300 Cocaine and metabolites        300 THC                            50 Performed at [facility] Lab, [address], [place], [place] [address]    Color, Urine 07/07/2021 AMBER (A)  YELLOW Final   BIOCHEMICALS MAY BE AFFECTED BY COLOR   APPearance 07/07/2021 HAZY (A)  CLEAR Final   Specific Gravity, Urine 07/07/2021 1.021  1.005 - 1.030 Final   pH 07/07/2021 6.0  5.0 - 8.0 Final   Glucose, UA 07/07/2021 NEGATIVE  NEGATIVE mg/dL Final   Hgb urine dipstick 07/07/2021 SMALL (A)  NEGATIVE Final   Bilirubin Urine 07/07/2021 NEGATIVE  NEGATIVE Final   Ketones, ur 07/07/2021 NEGATIVE  NEGATIVE mg/dL Final   Protein, ur 60/45/4098 30 (A)  NEGATIVE mg/dL Final   Nitrite 11/91/4782 NEGATIVE  NEGATIVE Final   Leukocytes,Ua 07/07/2021 NEGATIVE  NEGATIVE Final   RBC / HPF 07/07/2021 0-5  0 - 5 RBC/hpf Final   WBC, UA 07/07/2021 0-5  0 - 5 WBC/hpf Final   Bacteria, UA 07/07/2021 RARE (A)  NONE SEEN Final   Squamous Epithelial / LPF 07/07/2021 0-5  0 - 5 Final   Mucus 07/07/2021 PRESENT   Final   Hyaline Casts, UA 07/07/2021 PRESENT   Final   Performed at [facility], [address] [place], [place] [address]  Admission on 07/03/2021, Discharged on 07/03/2021  Component Date Value Ref Range Status   Sodium 07/03/2021 141  135 - 145 mmol/L Final   Potassium  07/03/2021 3.7  3.5 - 5.1 mmol/L Final   Chloride 07/03/2021 107  98 - 111 mmol/L Final   CO2 07/03/2021 24  22 - 32 mmol/L Final   Glucose, Bld 07/03/2021 100 (A)  70 - 99 mg/dL Final   Glucose reference range applies only to samples taken after fasting for at least 8 hours.   BUN 07/03/2021 5 (A)  6 - 20 mg/dL Final   Creatinine, Ser 07/03/2021 0.62  0.61 - 1.24 mg/dL Final   Calcium 30/86/5784 8.5 (A)  8.9 - 10.3 mg/dL Final   GFR, Estimated 07/03/2021 >60  >60 mL/min Final   Comment: (NOTE) Calculated using the CKD-EPI Creatinine Equation (2021)    Anion gap 07/03/2021 10  5 - 15 Final   Performed at [facility] Lab, [address], [place], [place] [address]   WBC 07/03/2021 5.1  4.0 - 10.5 K/uL Final   RBC 07/03/2021 4.03 (A)  4.22 - 5.81 MIL/uL Final   Hemoglobin 07/03/2021 13.2  13.0 - 17.0 g/dL Final   HCT 52/84/1324 38.2 (A)  39.0 - 52.0 % Final   MCV 07/03/2021 94.8  80.0 - 100.0 fL Final   MCH 07/03/2021 32.8  26.0 - 34.0 pg Final   MCHC 07/03/2021 34.6  30.0 - 36.0 g/dL Final   RDW 40/08/2724 13.2  11.5 - 15.5 % Final   Platelets 07/03/2021 111 (A)  150 - 400 K/uL Final   Comment: Immature Platelet Fraction may be clinically indicated, consider ordering this additional test [id number] REPEATED TO VERIFY PLATELET COUNT CONFIRMED BY SMEAR    nRBC 07/03/2021 0.0  0.0 - 0.2 % Final   Performed at [facility] Lab, [address], [place], [place] [address]   Troponin I (High Sensitivity) 07/03/2021 6  <18 ng/L Final   Comment: (NOTE) Elevated high sensitivity troponin I (hsTnI) values and significant  changes across serial measurements may suggest ACS but many other  chronic and acute conditions are known to elevate hsTnI results.  Refer to the "Links" section for chest pain algorithms and additional  guidance. Performed at [facility] Lab, [address], [place], [place] [address]    Alcohol, Ethyl (B) 07/03/2021 134 (A)  <10 mg/dL Final   Comment:  (NOTE) Lowest detectable limit for serum alcohol is 10 mg/dL.  For medical purposes only. Performed at [facility] Lab, [address], [place], [place] [address]    Opiates 07/03/2021 NONE DETECTED  NONE DETECTED Final   Cocaine 07/03/2021 NONE DETECTED  NONE DETECTED Final   Benzodiazepines 07/03/2021 NONE DETECTED  NONE DETECTED Final   Amphetamines 07/03/2021 NONE DETECTED  NONE DETECTED Final   Tetrahydrocannabinol 07/03/2021 NONE DETECTED  NONE DETECTED Final   Barbiturates 07/03/2021 NONE DETECTED  NONE DETECTED Final   Comment: (NOTE) DRUG SCREEN FOR MEDICAL PURPOSES ONLY.  IF CONFIRMATION IS NEEDED FOR ANY PURPOSE, NOTIFY LAB WITHIN 5 DAYS.  LOWEST DETECTABLE LIMITS FOR URINE DRUG SCREEN Drug Class                     Cutoff (ng/mL) Amphetamine and metabolites    1000 Barbiturate and metabolites    200 Benzodiazepine                 200 Tricyclics and metabolites     300 Opiates and metabolites        300 Cocaine and metabolites        300 THC                            50 Performed at [facility] Lab, [address] [place], [place] [address]   Admission on 06/30/2021, Discharged on 07/01/2021  Component Date Value Ref Range Status   SARS Coronavirus 2 by RT PCR 06/30/2021 NEGATIVE  NEGATIVE Final   Comment: (NOTE) SARS-CoV-2 target nucleic acids are NOT DETECTED.  The SARS-CoV-2 RNA is generally detectable in upper respiratory specimens during the acute phase of infection. The lowest concentration of SARS-CoV-2 viral copies this assay can detect is 138 copies/mL. A negative result does not preclude SARS-Cov-2 infection and should not be used as the sole basis for treatment or other patient management decisions. A negative result may occur with  improper specimen collection/handling, submission of specimen other than nasopharyngeal swab, presence of viral mutation(s) within the areas targeted by this assay, and inadequate number of viral copies(<138  copies/mL). A negative result must be combined with clinical observations, patient history, and epidemiological information. The expected result is Negative.  Fact Sheet for Patients:  BloggerCourse.com  Fact Sheet for Healthcare Providers:  SeriousBroker.it  This test is no  t yet approved or cleared by the [place] and  has been authorized for detection and/or diagnosis of SARS-CoV-2 by FDA under an Emergency Use Authorization (EUA). This EUA will remain  in effect (meaning this test can be used) for the duration of the COVID-19 declaration under Section 564(b)(1) of the Act, 21 U.S.C.section 360bbb-3(b)(1), unless the authorization is terminated  or revoked sooner.       Influenza A by PCR 06/30/2021 NEGATIVE  NEGATIVE Final   Influenza B by PCR 06/30/2021 NEGATIVE  NEGATIVE Final   Comment: (NOTE) The Xpert Xpress SARS-CoV-2/FLU/RSV plus assay is intended as an aid in the diagnosis of influenza from Nasopharyngeal swab specimens and should not be used as a sole basis for treatment. Nasal washings and aspirates are unacceptable for Xpert Xpress SARS-CoV-2/FLU/RSV testing.  Fact Sheet for Patients: BloggerCourse.com  Fact Sheet for Healthcare Providers: SeriousBroker.it  This test is not yet approved or cleared by the [place] FDA and has been authorized for detection and/or diagnosis of SARS-CoV-2 by FDA under an Emergency Use Authorization (EUA). This EUA will remain in effect (meaning this test can be used) for the duration of the COVID-19 declaration under Section 564(b)(1) of the Act, 21 U.S.C. section 360bbb-3(b)(1), unless the authorization is terminated or revoked.  Performed at [facility] Lab, [address], [place], [place] [address]    Sodium 06/30/2021 138  135 - 145 mmol/L Final   Potassium 06/30/2021 3.3 (A)  3.5 -  5.1 mmol/L Final   Chloride 06/30/2021 104  98 - 111 mmol/L Final   CO2 06/30/2021 22  22 - 32 mmol/L Final   Glucose, Bld 06/30/2021 109 (A)  70 - 99 mg/dL Final   Glucose reference range applies only to samples taken after fasting for at least 8 hours.   BUN 06/30/2021 5 (A)  6 - 20 mg/dL Final   Creatinine, Ser 06/30/2021 0.75  0.61 - 1.24 mg/dL Final   Calcium 83/41/9622 9.0  8.9 - 10.3 mg/dL Final   Total Protein 29/79/8921 7.5  6.5 - 8.1 g/dL Final   Albumin 19/41/7408 4.1  3.5 - 5.0 g/dL Final   AST 14/48/1856 49 (A)  15 - 41 U/L Final   ALT 06/30/2021 35  0 - 44 U/L Final   Alkaline Phosphatase 06/30/2021 74  38 - 126 U/L Final   Total Bilirubin 06/30/2021 0.5  0.3 - 1.2 mg/dL Final   GFR, Estimated 06/30/2021 >60  >60 mL/min Final   Comment: (NOTE) Calculated using the CKD-EPI Creatinine Equation (2021)    Anion gap 06/30/2021 12  5 - 15 Final   Performed at [facility], [address] [place], [place] [address]   Alcohol, Ethyl (B) 06/30/2021 381 (A)  <10 mg/dL Final   Comment: CRITICAL RESULT CALLED TO, READ BACK BY AND VERIFIED WITH: L SHULAR RN BY SSTEPHENS [phone] (NOTE) Lowest detectable limit for serum alcohol is 10 mg/dL.  For medical purposes only. Performed at [facility], [address], [place], [place] [address]    WBC 06/30/2021 4.4  4.0 - 10.5 K/uL Final   RBC 06/30/2021 4.15 (A)  4.22 - 5.81 MIL/uL Final   Hemoglobin 06/30/2021 13.4  13.0 - 17.0 g/dL Final   HCT 85/88/5027 40.1  39.0 - 52.0 % Final   MCV 06/30/2021 96.6  80.0 - 100.0 fL Final   MCH 06/30/2021 32.3  26.0 - 34.0 pg Final   MCHC 06/30/2021 33.4  30.0 -  36.0 g/dL Final   RDW 30/86/5784 12.9  11.5 - 15.5 % Final   Platelets 06/30/2021 132 (A)  150 - 400 K/uL Final   nRBC 06/30/2021 0.0  0.0 - 0.2 % Final   Neutrophils Relative % 06/30/2021 47  % Final   Neutro Abs 06/30/2021 2.0  1.7 - 7.7 K/uL Final   Lymphocytes Relative 06/30/2021 44  % Final   Lymphs Abs 06/30/2021  1.9  0.7 - 4.0 K/uL Final   Monocytes Relative 06/30/2021 5  % Final   Monocytes Absolute 06/30/2021 0.2  0.1 - 1.0 K/uL Final   Eosinophils Relative 06/30/2021 3  % Final   Eosinophils Absolute 06/30/2021 0.1  0.0 - 0.5 K/uL Final   Basophils Relative 06/30/2021 1  % Final   Basophils Absolute 06/30/2021 0.0  0.0 - 0.1 K/uL Final   Immature Granulocytes 06/30/2021 0  % Final   Abs Immature Granulocytes 06/30/2021 0.01  0.00 - 0.07 K/uL Final   Performed at [facility] Lab, [address], [place], [place] [address]   Acetaminophen (Tylenol), Serum 06/30/2021 <10 (A)  10 - 30 ug/mL Final   Comment: (NOTE) Therapeutic concentrations vary significantly. A range of 10-30 ug/mL  may be an effective concentration for many patients. However, some  are best treated at concentrations outside of this range. Acetaminophen concentrations >150 ug/mL at 4 hours after ingestion  and >50 ug/mL at 12 hours after ingestion are often associated with  toxic reactions.  Performed at [facility], [address], [place], [place] [address]    Salicylate Lvl 06/30/2021 <7.0 (A)  7.0 - 30.0 mg/dL Final   Performed at [facility] Lab, [address] [place], [place] [address]   Total CK 06/30/2021 820 (A)  49 - 397 U/L Final   Performed at [facility], [address], [place], [place] [address]  There may be more visits with results that are not included.    Blood Alcohol level:  Lab Results  Component Value Date   ETH 333 (HH) 08/26/2021   ETH 281 (H) 08/08/2021    Metabolic Disorder Labs: Lab Results  Component Value Date   HGBA1C 4.3 (L) 07/26/2021   MPG 76.71 07/26/2021   MPG 99.67 03/12/2021   No results found for: PROLACTIN Lab Results  Component Value Date   CHOL 198 07/26/2021   TRIG 209 (H) 07/26/2021   HDL 52 07/26/2021   CHOLHDL 3.8 07/26/2021   VLDL 42 (H) 07/26/2021   LDLCALC 104 (H) 07/26/2021   LDLCALC 79 09/19/2019    Therapeutic Lab Levels: No  results found for: LITHIUM No results found for: VALPROATE No components found for:  CBMZ  Physical Findings   AIMS    Flowsheet Row [facility]) [facility] Ad[facility]) [facility]  AIMS Total Score 0 17      AUDIT    Flowsheet Row [facility]) [facility]08[facility] Admission (Discharged) [facility]  Alcohol Use Disorder Identification Test Final Score (AUDIT) 25 39 36      PHQ2-9    Flowsheet Row ED from 02/11/2021 in [facility] EMERGENCY DEPARTMENT  PHQ-2 Total Score 6  PHQ-9 Total Score 23      Flowsheet Row ED from 08/26/2021 in [facility] ED from 08/25/2021 in [facility] ED from 08/08/2021 in [facility] EMERGENCY DEPARTMENT  C-SSRS RISK CATEGORY Low Risk High Risk Error: Question 6 not populated        Musculoskeletal  Strength & Muscle Tone: within normal limits Gait & Station: normal Patient leans: N/A  Psychiatric Specialty Exam  Presentation  General Appearance: Appropriate for Environment; Casual  Eye Contact:Fair  Speech:Clear and Coherent; Normal Rate  Speech Volume:Normal  Handedness:Right   Mood and Affect  Mood:Dysphoric; Irritable  Affect:Appropriate; Congruent; Labile   Thought Process  Thought Processes:Coherent; Goal Directed; Linear  Descriptions of Associations:Intact  Orientation:Full (Time, Place and Person)  Thought Content:WDL; Logical  Diagnosis of Schizophrenia or Schizoaffective disorder in past: No  Duration of Psychotic Symptoms: Less than six months   Hallucinations:Hallucinations: None  Ideas of Reference:None  Suicidal  Thoughts:Suicidal Thoughts: No  Homicidal Thoughts:Homicidal Thoughts: No   Sensorium  Memory:Immediate Good; Recent Fair; Remote Fair  Judgment:Fair  Insight:Fair   Executive Functions  Concentration:Fair  Attention Span:Fair  Recall:Fair  Fund of Knowledge:Fair  Language:Fair   Psychomotor Activity  Psychomotor Activity:Psychomotor Activity: Normal   Assets  Assets:Communication Skills; Desire for Improvement; Resilience   Sleep  Sleep:Sleep: Fair   No data recorded  Physical Exam  Physical Exam Constitutional:      Appearance: Normal appearance. He is normal weight.  HENT:     Head: Normocephalic and atraumatic.  Eyes:     Extraocular Movements: Extraocular movements intact.  Pulmonary:     Effort: Pulmonary effort is normal.  Neurological:     General: No focal deficit present.     Mental Status: He is alert and oriented to person, place, and time.  Psychiatric:        Attention and Perception: Attention and perception normal.        Speech: Speech normal.        Behavior: Behavior normal. Behavior is cooperative.        Thought Content: Thought content normal.   Review of Systems  Constitutional:  Positive for diaphoresis. Negative for chills and fever.  HENT:  Negative for hearing loss.   Eyes:  Negative for discharge and redness.  Respiratory:  Negative for cough.   Cardiovascular:  Negative for chest pain.  Gastrointestinal:  Positive for abdominal pain and nausea. Negative for vomiting.       Mild nausea  Musculoskeletal:  Positive for myalgias.  Neurological:  Positive for dizziness and tremors. Negative for headaches.  Psychiatric/Behavioral:  Positive for substance abuse. Negative for hallucinations and suicidal ideas.   Blood pressure 109/78, pulse 82, temperature (!) 97.1 F (36.2 C), temperature source Oral, resp. rate 18, SpO2 100 %. There is no height or weight on file to calculate BMI.  Treatment Plan Summary: 35 year old male  with history of substance use, depression, HIV, who presented to [facility] ED on 10/17 for SI, patient was found attempting to jump into traffic and punching traffic signs.  Patient reported he was suicidal with a plan to stab himself at that time.  Patient reported drinking 6-7 bottles of wine a day last drink being prior to presentation.  It was assessed by TTS.  Patient had reported that for the past 2 weeks he was drinking 6 fifths of vodka daily for the past 2 days he been drinking 6 bottles of wine.  Patient was initially, however patient has been calm.  Patient was medically cleared in the ED and  was accepted to [facility] for further treatment on 10/18. Etoh 333 and UDS+BZD + on presentation.   Patient continues to deny SI/HI/AVH today and remain emanle to receiving assistance with detox-is tolerating ativan taper well. Has  not needed any PRN ativan. Patient is appropriate for [facility] at this time for continued safe detox in a controlled enviornment   Alcohol use disorder , severe SIMD -initiate ativan taper  -detox protocol -restart gabapentin 300 mg TID -vitamin and thiamine supplementation   HIV -continue biktarvy   Dispo: likely self care as patient is declining substance use treatment and is not interested in residential rehab.   Estella Husk, MD 08/28/2021 1:34 PM

## 2021-08-28 NOTE — Progress Notes (Signed)
Patient given 650mg  tylenol for generalized body ache with pain at #6.  Awaiting result.

## 2021-08-28 NOTE — ED Notes (Signed)
Pt A&O x 4, no distress noted, pt anxious at present. Cooperative.  Monitoring for safety.

## 2021-08-28 NOTE — Progress Notes (Signed)
Patient out of bed presently sitting in day room watching tv with peers.  Somatic symptoms improving throughout the day.  Affect remains constricted with negativistic thought process.  Denies avh shi or plan.

## 2021-08-28 NOTE — ED Notes (Signed)
Pt sleeping at present, no distress noted.  Monitoring for safety. 

## 2021-08-28 NOTE — ED Notes (Addendum)
Patient is now awake after meeting with social worker.  RN introduced self and patient reports uncomfortable "burning " feeling in chest area.  Medications due and to be given shortly.  V.S. WNL no evidence of benzo withdrawal at present.  Taper continues.  MD made aware.  Patient is irritable but behavior is in control at this time.  Will continue to monitor, attempt to meet needs and provide safe environment.

## 2021-08-28 NOTE — ED Notes (Addendum)
This nurse spoke with provider Selena Batten) and advised him of patients request, Selena Batten stated he would put in a one time additional order for trazodone for the patient.

## 2021-08-28 NOTE — Clinical Social Work Psych Note (Signed)
CSW Update  CSW met with Faust to determine if there were any changes or updates to his treatment, and any potential discharge planning.   Angell presented irritable this morning, complaining of body aches and being nauseous. Nikesh denied having any questions or concerns at this time. Purnell denies having any SI, HI or AVH.   Asiah shared with treatment team that he planned to complete his detox and return home with his partner. Dailen continues to decline any substance abuse services, as well any outpatient psychiatric services.   CSW attempted explain the importance of continuity of care/ follow up. Olufemi expressed understanding, however continued to decline any referrals/follow up services.    CSW will continue to follow   Radonna Ricker, MSW, LCSW Clinical Social Worker ([place]) [facility]

## 2021-08-28 NOTE — Progress Notes (Signed)
After receiving PO PRN's earlier patient is now resting in bed.  No further complaints verbalized.  Remains irritable but behavior is in control.  Will continue to monitor.

## 2021-08-28 NOTE — Progress Notes (Signed)
Patient remains somewhat irritable and negativistic.  He complained of nausea and anxiety and was given phenergan and vistaril PO PRN now.  Patient is not satisfied with the ativan taper however accepted what was ordered and offered.  RN encouraged increased PO fluids.  He is now resting in bed but wants to be notified when lunch is served.  Will monitor and provide safe environment.

## 2021-08-28 NOTE — Group Note (Signed)
Group Topic: Relapse and Recovery  Group Date: 08/28/2021 Start Time: 1010 End Time: 1100 Facilitators: Levander Campion  Department: [facility]  Number of Participants: 6  Group Focus: goals/reality orientation, personal responsibility, and problem solving Treatment Modality:  Solution-Focused Therapy Interventions utilized were patient education and problem solving Purpose: enhance coping skills, express feelings, relapse prevention strategies, and trigger / craving management  Name: Gary [name] Date of Birth: 12-Aug-1986  MR: [id number]    Level of Participation: withdrawn Quality of Participation: withdrawn Interactions with others: never gave a feedback when asked questions Mood/Affect: appropriate Triggers (if applicable): n/a Cognition: no insight Progress: Minimal Response: n/a Plan: follow-up needed  Patients Problems:  Patient Active Problem List   Diagnosis Date Noted   Constipation 07/30/2021   Alcohol-induced insomnia (HCC)    MDD (major depressive disorder), recurrent episode, 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/26/2021   Neutropenia (HCC) 07/26/2021   Alcohol withdrawal seizure without complication (HCC)    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   Elevated troponin 10/19/2020   Alcohol withdrawal (HCC) 10/19/2020   Alcohol abuse    Suicidal ideation 06/14/2020   Pancreatitis 06/13/2020   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   Malnutrition of moderate degree 05/07/2020   Gastrointestinal hemorrhage    Alcoholic hepatitis without ascites    Melena 05/05/2020   Alcoholic ketoacidosis 05/05/2020   Thrombocytopenia (HCC) 07/25/2019   Transaminitis 07/25/2019   Traumatic subdural hematoma 07/02/2019   Alcohol abuse with intoxication (HCC) 07/02/2019   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

## 2021-08-28 NOTE — ED Notes (Signed)
Pt is currently in bed sleeping, no distress noted.

## 2021-08-29 DIAGNOSIS — F1994 Other psychoactive substance use, unspecified with psychoactive substance-induced mood disorder: Secondary | ICD-10-CM | POA: Diagnosis not present

## 2021-08-29 DIAGNOSIS — F10230 Alcohol dependence with withdrawal, uncomplicated: Secondary | ICD-10-CM | POA: Diagnosis not present

## 2021-08-29 DIAGNOSIS — Z21 Asymptomatic human immunodeficiency virus [HIV] infection status: Secondary | ICD-10-CM | POA: Diagnosis not present

## 2021-08-29 DIAGNOSIS — R45851 Suicidal ideations: Secondary | ICD-10-CM | POA: Diagnosis not present

## 2021-08-29 MED ORDER — POLYETHYLENE GLYCOL 3350 17 G PO PACK
17.0000 g | PACK | Freq: Every day | ORAL | Status: DC | PRN
  Administered 2021-08-29: 17 g via ORAL
  Filled 2021-08-29: qty 1

## 2021-08-29 NOTE — Progress Notes (Signed)
Patient visible on unit, but somewhat irritable at times.  Denies SI, HI, AVH and depression. Rated anxiety 10/10.  Reported no BM x2 days.  Given Milk of Magnesia.  Per MD, also new order for Miralax.  Patient medication compliant.  Attending group.  Continue to monitor for safety.

## 2021-08-29 NOTE — Progress Notes (Signed)
Patient sitting in dining room watching television.  Reported having had a BM.  "It's starting to work."  Continue to monitor for safety.

## 2021-08-29 NOTE — ED Notes (Signed)
Pt eating lunch

## 2021-08-29 NOTE — Clinical Social Work Psych Note (Addendum)
CSW Update  Dillion presented slightly irritable, however was compliant with this encounter. He presented with a flat affect, congruent mood.    Previn reports his mood has improved, however he was experiencing bloating symptoms, including nausea.   Javier continues to decline any referral services or resources for outpatient psychiatric services, outpatient substance abuse services and residential/rehabilitation substance abuse services.   He reports that he only came to "detox appropriately". Leodis continues to deny having any SI, HI or AVH.   Per Dr. Bronwen Betters, MD, it is expected for Billy to discharge on Sunday, 08/31/2021. Newton would have completed his Ativan taper protocol on Sunday, 08/31/21.   CSW will continue to follow until discharge.    Baldo Daub, MSW, LCSW Clinical Child psychotherapist (Facility Based Crisis) [facility]

## 2021-08-29 NOTE — ED Notes (Signed)
Pt eating dinner

## 2021-08-29 NOTE — ED Notes (Signed)
Pt attended group 

## 2021-08-29 NOTE — Group Note (Signed)
Group Topic: Wellness  Group Date: 08/29/2021 Start Time: 1000 End Time: 1100 Facilitators: Loma Newton  Department: [facility]  Number of Participants: 7  Group Focus: other wellness Treatment Modality:  Patient-Centered Therapy Interventions utilized were assignment Purpose: increase insight  Name: Gary [name] Date of Birth: 05-27-1986  MR: [id number]    Level of Participation: active Quality of Participation: cooperative Interactions with others: gave feedback Mood/Affect: positive Triggers (if applicable): n/a Cognition: coherent/clear Progress: Gaining insight Response: n/a Plan: referral / recommendations  Patients Problems:  Patient Active Problem List   Diagnosis Date Noted   Constipation 07/30/2021   Alcohol-induced insomnia (HCC)    MDD (major depressive disorder), recurrent episode, 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/26/2021   Neutropenia (HCC) 07/26/2021   Alcohol withdrawal seizure without complication (HCC)    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   Elevated troponin 10/19/2020   Alcohol withdrawal (HCC) 10/19/2020   Alcohol abuse    Suicidal ideation 06/14/2020   Pancreatitis 06/13/2020   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   Malnutrition of moderate degree 05/07/2020   Gastrointestinal hemorrhage    Alcoholic hepatitis without ascites    Melena 05/05/2020   Alcoholic ketoacidosis 05/05/2020   Thrombocytopenia (HCC) 07/25/2019   Transaminitis 07/25/2019   Traumatic subdural hematoma 07/02/2019   Alcohol abuse with intoxication (HCC) 07/02/2019   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

## 2021-08-29 NOTE — Progress Notes (Signed)
Patient given Miralax with 8 oz of water.  Patient teaching done regarding new medication.  Patient verbalized understanding.

## 2021-08-29 NOTE — ED Provider Notes (Signed)
Behavioral Health Progress Note  Date and Time: 08/28/2021 1:34 PM Name: Gary [name] MRN:  [id number]  Subjective:    Patient seen and chart reviewed-he has been medication compliant, he has been attending groups.   Most recent CIWA score of 4 .VSS.  Patient interviewed this morning in conjunction with social work.  Patient describes his mood as "good" initially but then states he is "going to be angry if I can't eat".  Patient goes on to describe experiencing bloating, constipation and nausea which makes it difficult for him to eat.  Patient indicates that he will be angry if he is unable to eat breakfast.  Patient states that he is taking Milk of Magnesia for constipation but it has not been helpful.  Discussed with patient that an additional medication will be ordered to assist with this-patient verbalizes understanding.  He denies withdrawal symptoms of vomiting, tremors, diaphoresis.  He denies SI/HI/AVH.  Patient is irritable on interview; however, is more calm than he was yesterday.  Discussed with patient that it is recommended that he remain in the hospital until completion of his Ativan taper which is scheduled to end Sunday.  Patient was in agreement and verbalized understanding.  He continues to deny need or desire for substance use treatment once he completes detox.       Diagnosis:  Final diagnoses:  Substance induced mood disorder (HCC)  Alcohol withdrawal syndrome without complication (HCC)  Alcohol use disorder, severe, dependence (HCC)    Total Time spent with patient: 15 minutes  Past Psychiatric History: see H&P Past Medical History:  Past Medical History:  Diagnosis Date   Alcohol abuse    HIV (human immunodeficiency virus infection) (HCC)    Seizures (HCC)    Subdural hematoma     Past Surgical History:  Procedure Laterality Date   INCISION AND DRAINAGE PERIRECTAL ABSCESS N/A 09/24/2016   Procedure: IRRIGATION AND DEBRIDEMENT PERIRECTAL ABSCESS;   Surgeon: Ricarda Frame, MD;  Location: [facility] ORS;  Service: General;  Laterality: N/A;   none     Family History:  Family History  Problem Relation Age of Onset   Alcohol abuse Mother    Alcohol abuse Father    Family Psychiatric  History: see H&p Social History:  Social History   Substance and Sexual Activity  Alcohol Use Yes   Comment: "I drink all I can"     Social History   Substance and Sexual Activity  Drug Use Yes   Types: Methamphetamines, Marijuana   Comment: last used 04/10/2019    Social History   Socioeconomic History   Marital status: Single    Spouse name: Not on file   Number of children: Not on file   Years of education: Not on file   Highest education level: Not on file  Occupational History   Occupation: unemployed  Tobacco Use   Smoking status: Every Day    Packs/day: 0.50    Types: Cigarettes   Smokeless tobacco: Never   Tobacco comments:    unable to smoke while incarcerated 6+ months 02/13/20  Vaping Use   Vaping Use: Never used  Substance and Sexual Activity   Alcohol use: Yes    Comment: "I drink all I can"   Drug use: Yes    Types: Methamphetamines, Marijuana    Comment: last used 04/10/2019   Sexual activity: Yes    Partners: Male, Male    Comment: declined condoms  03/2021  Other Topics Concern   Not on file  Social History Narrative   "Currently living on the streets"   Independent at baseline   Social Determinants of Corporate investment banker Strain: Not on file  Food Insecurity: Not on file  Transportation Needs: Not on file  Physical Activity: Not on file  Stress: Not on file  Social Connections: Not on file   SDOH:  SDOH Screenings   Alcohol Screen: Medium Risk   Last Alcohol Screening Score (AUDIT): 25  Depression (PHQ2-9): Medium Risk   PHQ-2 Score: 23  Financial Resource Strain: Not on file  Food Insecurity: Not on file  Housing: Not on file  Physical Activity: Not on file  Social Connections: Not on file   Stress: Not on file  Tobacco Use: High Risk   Smoking Tobacco Use: Every Day   Smokeless Tobacco Use: Never   Passive Exposure: Not on file  Transportation Needs: Not on file               Sleep: Good  Appetite:  Good  Current Medications:  Current Facility-Administered Medications  Medication Dose Route Frequency Provider Last Rate Last Admin   acetaminophen (TYLENOL) tablet 650 mg  650 mg Oral Q6H PRN Estella Husk, MD   650 mg at 08/28/21 1006   alum & mag hydroxide-simeth (MAALOX/MYLANTA) 200-200-20 MG/5ML suspension 30 mL  30 mL Oral Q4H PRN Estella Husk, MD       bictegravir-emtricitabine-tenofovir AF (BIKTARVY) 50-200-25 MG per tablet 1 tablet  1 tablet Oral Daily Estella Husk, MD   1 tablet at 08/28/21 1006   gabapentin (NEURONTIN) capsule 300 mg  300 mg Oral TID Estella Husk, MD   300 mg at 08/28/21 1006   hydrOXYzine (ATARAX/VISTARIL) tablet 25 mg  25 mg Oral TID PRN Estella Husk, MD   25 mg at 08/27/21 2116   hydrOXYzine (ATARAX/VISTARIL) tablet 25 mg  25 mg Oral Q6H PRN Estella Husk, MD   25 mg at 08/28/21 1101   loperamide (IMODIUM) capsule 2-4 mg  2-4 mg Oral PRN Estella Husk, MD       LORazepam (ATIVAN) tablet 1 mg  1 mg Oral Q6H PRN Estella Husk, MD       LORazepam (ATIVAN) tablet 1 mg  1 mg Oral TID Estella Husk, MD   1 mg at 08/28/21 1006   Followed by   Melene Muller ON 08/29/2021] LORazepam (ATIVAN) tablet 1 mg  1 mg Oral BID Estella Husk, MD       Followed by   Melene Muller ON 08/30/2021] LORazepam (ATIVAN) tablet 1 mg  1 mg Oral Daily Estella Husk, MD       magnesium hydroxide (MILK OF MAGNESIA) suspension 30 mL  30 mL Oral Daily PRN Estella Husk, MD       multivitamin with minerals tablet 1 tablet  1 tablet Oral Daily Estella Husk, MD   1 tablet at 08/28/21 1006   pantoprazole (PROTONIX) EC tablet 20 mg  20 mg Oral BID Estella Husk, MD   20 mg at 08/28/21 1006    promethazine (PHENERGAN) tablet 25 mg  25 mg Oral Q6H PRN Estella Husk, MD   25 mg at 08/28/21 1101   thiamine tablet 100 mg  100 mg Oral Daily Estella Husk, MD   100 mg at 08/28/21 1006   traZODone (DESYREL) tablet 100 mg  100 mg Oral QHS PRN Estella Husk, MD  Current Outpatient Medications  Medication Sig Dispense Refill   benztropine (COGENTIN) 1 MG tablet Take 1 tablet (1 mg total) by mouth 2 (two) times daily. (Patient not taking: Reported on 08/26/2021) 60 tablet 0   bictegravir-emtricitabine-tenofovir AF (BIKTARVY) 50-200-25 MG TABS tablet Take 1 tablet by mouth daily after lunch. 30 tablet 0   doxepin (SINEQUAN) 10 MG capsule Take 1 capsule (10 mg total) by mouth at bedtime. (Patient not taking: Reported on 08/26/2021) 30 capsule 0   folic acid (FOLVITE) 1 MG tablet Take 1 tablet (1 mg total) by mouth daily. (Patient not taking: Reported on 08/26/2021)     gabapentin (NEURONTIN) 300 MG capsule Take 1 capsule (300 mg total) by mouth 3 (three) times daily. (Patient not taking: Reported on 08/26/2021) 90 capsule 2   Multiple Vitamin (MULTIVITAMIN WITH MINERALS) TABS tablet Take 1 tablet by mouth daily. (Patient not taking: Reported on 08/26/2021)     pantoprazole (PROTONIX) 20 MG tablet Take 1 tablet (20 mg total) by mouth 2 (two) times daily. (Patient not taking: Reported on 08/26/2021) 30 tablet 0   thiamine 100 MG tablet Take 1 tablet (100 mg total) by mouth daily. (Patient not taking: Reported on 08/26/2021) 30 tablet 0    Labs  Lab Results:  Admission on 08/25/2021, Discharged on 08/26/2021  Component Date Value Ref Range Status   WBC 08/26/2021 3.9 (A)  4.0 - 10.5 K/uL Final   RBC 08/26/2021 4.45  4.22 - 5.81 MIL/uL Final   Hemoglobin 08/26/2021 15.1  13.0 - 17.0 g/dL Final   HCT 40/98/1191 44.0  39.0 - 52.0 % Final   MCV 08/26/2021 98.9  80.0 - 100.0 fL Final   MCH 08/26/2021 33.9  26.0 - 34.0 pg Final   MCHC 08/26/2021 34.3  30.0 - 36.0 g/dL  Final   RDW 47/82/9562 16.2 (A)  11.5 - 15.5 % Final   Platelets 08/26/2021 143 (A)  150 - 400 K/uL Final   nRBC 08/26/2021 0.0  0.0 - 0.2 % Final   Neutrophils Relative % 08/26/2021 35  % Final   Neutro Abs 08/26/2021 1.4 (A)  1.7 - 7.7 K/uL Final   Lymphocytes Relative 08/26/2021 50  % Final   Lymphs Abs 08/26/2021 1.9  0.7 - 4.0 K/uL Final   Monocytes Relative 08/26/2021 11  % Final   Monocytes Absolute 08/26/2021 0.4  0.1 - 1.0 K/uL Final   Eosinophils Relative 08/26/2021 3  % Final   Eosinophils Absolute 08/26/2021 0.1  0.0 - 0.5 K/uL Final   Basophils Relative 08/26/2021 1  % Final   Basophils Absolute 08/26/2021 0.0  0.0 - 0.1 K/uL Final   Immature Granulocytes 08/26/2021 0  % Final   Abs Immature Granulocytes 08/26/2021 0.00  0.00 - 0.07 K/uL Final   Performed at [facility], [address] [place], [place] [address]   Alcohol, Ethyl (B) 08/26/2021 333 (A)  <10 mg/dL Final   Comment: CRITICAL RESULT CALLED TO, READ BACK BY AND VERIFIED WITH: MERRICKS LOU RN 08/26/21 @ 0308 VS (NOTE) Lowest detectable limit for serum alcohol is 10 mg/dL.  For medical purposes only. Performed at [facility], [address], [place], [place] [address]    Salicylate Lvl 08/26/2021 <7.0 (A)  7.0 - 30.0 mg/dL Final   Performed at [facility], [address] [place], [place] [address]   Acetaminophen (Tylenol), Serum 08/26/2021 1 (A)  10 - 30 ug/mL Final   Performed at [facility],  [address] [place], [place] [address]   Sodium 08/26/2021 138  135 - 145 mmol/L Final   Potassium 08/26/2021 3.7  3.5 - 5.1 mmol/L Final   Chloride 08/26/2021 101  98 - 111 mmol/L Final   CO2 08/26/2021 26  22 - 32 mmol/L Final   Glucose, Bld 08/26/2021 99  70 - 99 mg/dL Final   Glucose reference range applies only to samples taken after fasting for at least 8 hours.   BUN 08/26/2021 6  6 - 20 mg/dL Final   Creatinine, Ser  08/26/2021 0.65  0.61 - 1.24 mg/dL Final   Calcium 81/19/1478 9.3  8.9 - 10.3 mg/dL Final   Total Protein 29/56/2130 8.0  6.5 - 8.1 g/dL Final   Albumin 86/57/8469 4.8  3.5 - 5.0 g/dL Final   AST 62/95/2841 56 (A)  15 - 41 U/L Final   ALT 08/26/2021 35  0 - 44 U/L Final   Alkaline Phosphatase 08/26/2021 56  38 - 126 U/L Final   Total Bilirubin 08/26/2021 0.5  0.3 - 1.2 mg/dL Final   GFR, Estimated 08/26/2021 >60  >60 mL/min Final   Comment: (NOTE) Calculated using the CKD-EPI Creatinine Equation (2021)    Anion gap 08/26/2021 11  5 - 15 Final   Performed at [facility], [address], [place], [place] [address]   Opiates 08/26/2021 NONE DETECTED  NONE DETECTED Final   Cocaine 08/26/2021 NONE DETECTED  NONE DETECTED Final   Benzodiazepines 08/26/2021 POSITIVE (A)  NONE DETECTED Final   Amphetamines 08/26/2021 NONE DETECTED  NONE DETECTED Final   Tetrahydrocannabinol 08/26/2021 NONE DETECTED  NONE DETECTED Final   Barbiturates 08/26/2021 NONE DETECTED  NONE DETECTED Final   Comment: (NOTE) DRUG SCREEN FOR MEDICAL PURPOSES ONLY.  IF CONFIRMATION IS NEEDED FOR ANY PURPOSE, NOTIFY LAB WITHIN 5 DAYS.  LOWEST DETECTABLE LIMITS FOR URINE DRUG SCREEN Drug Class                     Cutoff (ng/mL) Amphetamine and metabolites    1000 Barbiturate and metabolites    200 Benzodiazepine                 200 Tricyclics and metabolites     300 Opiates and metabolites        300 Cocaine and metabolites        300 THC                            50 Performed at [facility], [address], [place], [place] [address]    SARS Coronavirus 2 by RT PCR 08/26/2021 NEGATIVE  NEGATIVE Final   Comment: (NOTE) SARS-CoV-2 target nucleic acids are NOT DETECTED.  The SARS-CoV-2 RNA is generally detectable in upper respiratory specimens during the acute phase of infection. The lowest concentration of SARS-CoV-2 viral copies this assay can detect is 138 copies/mL. A  negative result does not preclude SARS-Cov-2 infection and should not be used as the sole basis for treatment or other patient management decisions. A negative result may occur with  improper specimen collection/handling, submission of specimen other than nasopharyngeal swab, presence of viral mutation(s) within the areas targeted by this assay, and inadequate number of viral copies(<138 copies/mL). A negative result must be combined with clinical observations, patient history, and epidemiological information. The expected result is Negative.  Fact Sheet for Patients:  BloggerCourse.com  Fact Sheet for Healthcare Providers:  SeriousBroker.it  This test is no                          t yet approved or cleared by the [place] and  has been authorized for detection and/or diagnosis of SARS-CoV-2 by FDA under an Emergency Use Authorization (EUA). This EUA will remain  in effect (meaning this test can be used) for the duration of the COVID-19 declaration under Section 564(b)(1) of the Act, 21 U.S.C.section 360bbb-3(b)(1), unless the authorization is terminated  or revoked sooner.       Influenza A by PCR 08/26/2021 NEGATIVE  NEGATIVE Final   Influenza B by PCR 08/26/2021 NEGATIVE  NEGATIVE Final   Comment: (NOTE) The Xpert Xpress SARS-CoV-2/FLU/RSV plus assay is intended as an aid in the diagnosis of influenza from Nasopharyngeal swab specimens and should not be used as a sole basis for treatment. Nasal washings and aspirates are unacceptable for Xpert Xpress SARS-CoV-2/FLU/RSV testing.  Fact Sheet for Patients: BloggerCourse.com  Fact Sheet for Healthcare Providers: SeriousBroker.it  This test is not yet approved or cleared by the [place] FDA and has been authorized for detection and/or diagnosis of SARS-CoV-2 by FDA under an Emergency Use Authorization (EUA). This  EUA will remain in effect (meaning this test can be used) for the duration of the COVID-19 declaration under Section 564(b)(1) of the Act, 21 U.S.C. section 360bbb-3(b)(1), unless the authorization is terminated or revoked.  Performed at [facility], [address], [place], [place] [address]   Admission on 08/08/2021, Discharged on 08/09/2021  Component Date Value Ref Range Status   WBC 08/08/2021 4.7  4.0 - 10.5 K/uL Final   RBC 08/08/2021 4.29  4.22 - 5.81 MIL/uL Final   Hemoglobin 08/08/2021 14.5  13.0 - 17.0 g/dL Final   HCT 46/96/2952 43.0  39.0 - 52.0 % Final   MCV 08/08/2021 100.2 (A)  80.0 - 100.0 fL Final   MCH 08/08/2021 33.8  26.0 - 34.0 pg Final   MCHC 08/08/2021 33.7  30.0 - 36.0 g/dL Final   RDW 84/13/2440 16.9 (A)  11.5 - 15.5 % Final   Platelets 08/08/2021 460 (A)  150 - 400 K/uL Final   nRBC 08/08/2021 0.0  0.0 - 0.2 % Final   Neutrophils Relative % 08/08/2021 45  % Final   Neutro Abs 08/08/2021 2.1  1.7 - 7.7 K/uL Final   Lymphocytes Relative 08/08/2021 45  % Final   Lymphs Abs 08/08/2021 2.2  0.7 - 4.0 K/uL Final   Monocytes Relative 08/08/2021 6  % Final   Monocytes Absolute 08/08/2021 0.3  0.1 - 1.0 K/uL Final   Eosinophils Relative 08/08/2021 2  % Final   Eosinophils Absolute 08/08/2021 0.1  0.0 - 0.5 K/uL Final   Basophils Relative 08/08/2021 2  % Final   Basophils Absolute 08/08/2021 0.1  0.0 - 0.1 K/uL Final   Immature Granulocytes 08/08/2021 0  % Final   Abs Immature Granulocytes 08/08/2021 0.00  0.00 - 0.07 K/uL Final   Performed at [facility], [address], [place], [place] [address]   Sodium 08/08/2021 138  135 - 145 mmol/L Final   Potassium 08/08/2021 3.6  3.5 - 5.1 mmol/L Final   Chloride 08/08/2021 101  98 - 111 mmol/L Final   CO2 08/08/2021 19 (A)  22 - 32 mmol/L Final   Glucose, Bld 08/08/2021 82  70 - 99 mg/dL Final   Glucose reference range applies  only to samples taken after fasting for at least 8 hours.   BUN  08/08/2021 <5 (A)  6 - 20 mg/dL Final   Creatinine, Ser 08/08/2021 0.76  0.61 - 1.24 mg/dL Final   Calcium 16/08/9603 9.4  8.9 - 10.3 mg/dL Final   Total Protein 54/07/8118 7.8  6.5 - 8.1 g/dL Final   Albumin 14/78/2956 4.3  3.5 - 5.0 g/dL Final   AST 21/30/8657 46 (A)  15 - 41 U/L Final   ALT 08/08/2021 32  0 - 44 U/L Final   Alkaline Phosphatase 08/08/2021 68  38 - 126 U/L Final   Total Bilirubin 08/08/2021 0.7  0.3 - 1.2 mg/dL Final   GFR, Estimated 08/08/2021 >60  >60 mL/min Final   Comment: (NOTE) Calculated using the CKD-EPI Creatinine Equation (2021)    Anion gap 08/08/2021 18 (A)  5 - 15 Final   Performed at [facility], [address] [place], [place] [address]   Alcohol, Ethyl (B) 08/08/2021 281 (A)  <10 mg/dL Final   Comment: (NOTE) Lowest detectable limit for serum alcohol is 10 mg/dL.  For medical purposes only. Performed at [facility], [address], [place], [place] [address]    Lipase 08/08/2021 53 (A)  11 - 51 U/L Final   Performed at [facility], [address] [place], [place] [address]   Color, Urine 08/08/2021 YELLOW  YELLOW Final   APPearance 08/08/2021 CLEAR  CLEAR Final   Specific Gravity, Urine 08/08/2021 1.009  1.005 - 1.030 Final   pH 08/08/2021 5.0  5.0 - 8.0 Final   Glucose, UA 08/08/2021 NEGATIVE  NEGATIVE mg/dL Final   Hgb urine dipstick 08/08/2021 NEGATIVE  NEGATIVE Final   Bilirubin Urine 08/08/2021 NEGATIVE  NEGATIVE Final   Ketones, ur 08/08/2021 20 (A)  NEGATIVE mg/dL Final   Protein, ur 40/08/2724 NEGATIVE  NEGATIVE mg/dL Final   Nitrite 36/64/4034 NEGATIVE  NEGATIVE Final   Leukocytes,Ua 08/08/2021 NEGATIVE  NEGATIVE Final   Performed at [facility], [address] [place], [place] [address]  Admission on 08/05/2021, Discharged on 08/05/2021  Component Date Value Ref Range Status   Glucose-Capillary 08/05/2021 73  70 - 99 mg/dL Final   Glucose reference range applies only to samples taken after fasting for  at least 8 hours.  Admission on 07/26/2021, Discharged on 07/31/2021  Component Date Value Ref Range Status   WBC 07/26/2021 2.7 (A)  4.0 - 10.5 K/uL Final   RBC 07/26/2021 3.94 (A)  4.22 - 5.81 MIL/uL Final   Hemoglobin 07/26/2021 13.7  13.0 - 17.0 g/dL Final   HCT 56/38/7564 40.3  39.0 - 52.0 % Final   MCV 07/26/2021 102.3 (A)  80.0 - 100.0 fL Final   MCH 07/26/2021 34.8 (A)  26.0 - 34.0 pg Final   MCHC 07/26/2021 34.0  30.0 - 36.0 g/dL Final   RDW 33/29/5188 17.0 (A)  11.5 - 15.5 % Final   Platelets 07/26/2021 143 (A)  150 - 400 K/uL Final   Comment: Immature Platelet Fraction may be clinically indicated, consider ordering this additional test [id number] REPEATED TO VERIFY    nRBC 07/26/2021 0.0  0.0 - 0.2 % Final   Performed at [facility], [address] [place], [place] [address]   Sodium 07/26/2021 135  135 - 145 mmol/L Final   Potassium 07/26/2021 3.3 (A)  3.5 - 5.1 mmol/L Final   Chloride 07/26/2021 99  98 - 111 mmol/L Final  CO2 07/26/2021 28  22 - 32 mmol/L Final   Glucose, Bld 07/26/2021 94  70 - 99 mg/dL Final   Glucose reference range applies only to samples taken after fasting for at least 8 hours.   BUN 07/26/2021 8  6 - 20 mg/dL Final   Creatinine, Ser 07/26/2021 0.57 (A)  0.61 - 1.24 mg/dL Final   Calcium 16/08/9603 9.6  8.9 - 10.3 mg/dL Final   Total Protein 54/07/8118 7.4  6.5 - 8.1 g/dL Final   Albumin 14/78/2956 4.1  3.5 - 5.0 g/dL Final   AST 21/30/8657 34  15 - 41 U/L Final   ALT 07/26/2021 24  0 - 44 U/L Final   Alkaline Phosphatase 07/26/2021 75  38 - 126 U/L Final   Total Bilirubin 07/26/2021 1.7 (A)  0.3 - 1.2 mg/dL Final   GFR, Estimated 07/26/2021 >60  >60 mL/min Final   Comment: (NOTE) Calculated using the CKD-EPI Creatinine Equation (2021)    Anion gap 07/26/2021 8  5 - 15 Final   Performed at [facility], [address], [place], [place] [address]   Cholesterol 07/26/2021 198  0 - 200 mg/dL Final    Triglycerides 07/26/2021 209 (A)  <150 mg/dL Final   HDL 29/52/8413 52  >40 mg/dL Final   Total CHOL/HDL Ratio 07/26/2021 3.8  RATIO Final   VLDL 07/26/2021 42 (A)  0 - 40 mg/dL Final   LDL Cholesterol 07/26/2021 104 (A)  0 - 99 mg/dL Final   Comment:        Total Cholesterol/HDL:CHD Risk Coronary Heart Disease Risk Table                     Men   Women  1/2 Average Risk   3.4   3.3  Average Risk       5.0   4.4  2 X Average Risk   9.6   7.1  3 X Average Risk  23.4   11.0        Use the calculated Patient Ratio above and the CHD Risk Table to determine the patient's CHD Risk.        ATP III CLASSIFICATION (LDL):  <100     mg/dL   Optimal  244-010  mg/dL   Near or Above                    Optimal  130-159  mg/dL   Borderline  272-536  mg/dL   High  >644     mg/dL   Very High Performed at [facility], [address], [place], [place] [address]    Total Protein 07/26/2021 7.5  6.5 - 8.1 g/dL Final   Albumin 25/95/6387 4.0  3.5 - 5.0 g/dL Final   AST 56/43/3295 35  15 - 41 U/L Final   ALT 07/26/2021 24  0 - 44 U/L Final   Alkaline Phosphatase 07/26/2021 75  38 - 126 U/L Final   Total Bilirubin 07/26/2021 1.7 (A)  0.3 - 1.2 mg/dL Final   Bilirubin, Direct 07/26/2021 0.3 (A)  0.0 - 0.2 mg/dL Final   Indirect Bilirubin 07/26/2021 1.4 (A)  0.3 - 0.9 mg/dL Final   Performed at [facility], [address] [place], [place] [address]   Hgb A1c MFr Bld 07/26/2021 4.3 (A)  4.8 - 5.6 % Final   Comment: (NOTE) Pre diabetes:          5.7%-6.4%  Diabetes:              >  6.4%  Glycemic control for   <7.0% adults with diabetes    Mean Plasma Glucose 07/26/2021 76.71  mg/dL Final   Performed at [facility], [address], [place], [place] [address]   WBC 07/26/2021 3.1 (A)  4.0 - 10.5 K/uL Final   RBC 07/26/2021 4.03 (A)  4.22 - 5.81 MIL/uL Final   Hemoglobin 07/26/2021 13.7  13.0 - 17.0 g/dL Final   HCT 47/82/9562 41.2  39.0 - 52.0 % Final    MCV 07/26/2021 102.2 (A)  80.0 - 100.0 fL Final   MCH 07/26/2021 34.0  26.0 - 34.0 pg Final   MCHC 07/26/2021 33.3  30.0 - 36.0 g/dL Final   RDW 13/06/6577 17.0 (A)  11.5 - 15.5 % Final   Platelets 07/26/2021 133 (A)  150 - 400 K/uL Final   nRBC 07/26/2021 0.0  0.0 - 0.2 % Final   Neutrophils Relative % 07/26/2021 68  % Final   Neutro Abs 07/26/2021 2.1  1.7 - 7.7 K/uL Final   Lymphocytes Relative 07/26/2021 22  % Final   Lymphs Abs 07/26/2021 0.7  0.7 - 4.0 K/uL Final   Monocytes Relative 07/26/2021 6  % Final   Monocytes Absolute 07/26/2021 0.2  0.1 - 1.0 K/uL Final   Eosinophils Relative 07/26/2021 3  % Final   Eosinophils Absolute 07/26/2021 0.1  0.0 - 0.5 K/uL Final   Basophils Relative 07/26/2021 1  % Final   Basophils Absolute 07/26/2021 0.0  0.0 - 0.1 K/uL Final   Immature Granulocytes 07/26/2021 0  % Final   Abs Immature Granulocytes 07/26/2021 0.01  0.00 - 0.07 K/uL Final   Performed at [facility], [address], [place], [place] [address]   Sodium 07/26/2021 138  135 - 145 mmol/L Final   Potassium 07/26/2021 3.5  3.5 - 5.1 mmol/L Final   Chloride 07/26/2021 101  98 - 111 mmol/L Final   CO2 07/26/2021 28  22 - 32 mmol/L Final   Glucose, Bld 07/26/2021 102 (A)  70 - 99 mg/dL Final   Glucose reference range applies only to samples taken after fasting for at least 8 hours.   BUN 07/26/2021 9  6 - 20 mg/dL Final   Creatinine, Ser 07/26/2021 0.72  0.61 - 1.24 mg/dL Final   Calcium 95/28/4132 9.9  8.9 - 10.3 mg/dL Final   Total Protein 44/11/270 7.6  6.5 - 8.1 g/dL Final   Albumin 53/66/4403 4.3  3.5 - 5.0 g/dL Final   AST 47/42/5956 37  15 - 41 U/L Final   ALT 07/26/2021 23  0 - 44 U/L Final   Alkaline Phosphatase 07/26/2021 74  38 - 126 U/L Final   Total Bilirubin 07/26/2021 1.6 (A)  0.3 - 1.2 mg/dL Final   GFR, Estimated 07/26/2021 >60  >60 mL/min Final   Comment: (NOTE) Calculated using the CKD-EPI Creatinine Equation (2021)    Anion gap  07/26/2021 9  5 - 15 Final   Performed at [facility], [address] [place], [place] [address]   Magnesium 07/26/2021 1.6 (A)  1.7 - 2.4 mg/dL Final   Performed at [facility], [address] [place], [place] [address]   HIV 1 RNA Quant 07/26/2021 WRORD  copies/mL Corrected   Comment: (NOTE) Test not performed. The required specimen for the test ordered was not received. Received frozen whole blood Requires frozen plasma 07/28/2021 The reportable range for this assay is 20 to 10,000,000 copies HIV-1 RNA/mL.  TSH 07/26/2021 2.079  0.350 - 4.500 uIU/mL Final   Comment: Performed by a 3rd Generation assay with a functional sensitivity of <=0.01 uIU/mL. Performed at [facility], [address] [place], [place] [address]    Sodium 07/28/2021 134 (A)  135 - 145 mmol/L Final   Potassium 07/28/2021 4.1  3.5 - 5.1 mmol/L Final   Chloride 07/28/2021 99  98 - 111 mmol/L Final   CO2 07/28/2021 26  22 - 32 mmol/L Final   Glucose, Bld 07/28/2021 96  70 - 99 mg/dL Final   Glucose reference range applies only to samples taken after fasting for at least 8 hours.   BUN 07/28/2021 11  6 - 20 mg/dL Final   Creatinine, Ser 07/28/2021 0.74  0.61 - 1.24 mg/dL Final   Calcium 19/14/7829 9.2  8.9 - 10.3 mg/dL Final   GFR, Estimated 07/28/2021 >60  >60 mL/min Final   Comment: (NOTE) Calculated using the CKD-EPI Creatinine Equation (2021)    Anion gap 07/28/2021 9  5 - 15 Final   Performed at [facility], [address], [place], [place] [address]   WBC 07/28/2021 3.6 (A)  4.0 - 10.5 K/uL Final   RBC 07/28/2021 3.87 (A)  4.22 - 5.81 MIL/uL Final   Hemoglobin 07/28/2021 13.3  13.0 - 17.0 g/dL Final   HCT 08/65/7846 40.0  39.0 - 52.0 % Final   MCV 07/28/2021 103.4 (A)  80.0 - 100.0 fL Final   MCH 07/28/2021 34.4 (A)  26.0 - 34.0 pg Final   MCHC 07/28/2021 33.3  30.0 - 36.0 g/dL Final   RDW 96/29/5284 17.2 (A)  11.5 -  15.5 % Final   Platelets 07/28/2021 140 (A)  150 - 400 K/uL Final   nRBC 07/28/2021 0.0  0.0 - 0.2 % Final   Neutrophils Relative % 07/28/2021 59  % Final   Neutro Abs 07/28/2021 2.1  1.7 - 7.7 K/uL Final   Lymphocytes Relative 07/28/2021 28  % Final   Lymphs Abs 07/28/2021 1.0  0.7 - 4.0 K/uL Final   Monocytes Relative 07/28/2021 9  % Final   Monocytes Absolute 07/28/2021 0.3  0.1 - 1.0 K/uL Final   Eosinophils Relative 07/28/2021 3  % Final   Eosinophils Absolute 07/28/2021 0.1  0.0 - 0.5 K/uL Final   Basophils Relative 07/28/2021 1  % Final   Basophils Absolute 07/28/2021 0.0  0.0 - 0.1 K/uL Final   Immature Granulocytes 07/28/2021 0  % Final   Abs Immature Granulocytes 07/28/2021 0.01  0.00 - 0.07 K/uL Final   Performed at [facility], [address], [place], [place] [address]   Magnesium 07/28/2021 2.1  1.7 - 2.4 mg/dL Final   Performed at [facility], [address], [place], [place] [address]   HIV 1 RNA Quant 07/30/2021 40  copies/mL Corrected   Comment: (NOTE) The reportable range for this assay is 20 to 10,000,000 copies HIV-1 RNA/mL.    LOG10 HIV-1 RNA 07/30/2021 1.602  log10copy/mL Final   Comment: (NOTE) Performed At: [facility] [address] [place], [place] [id number] Jolene Schimke MD KV:[id number]   Admission on 07/24/2021, Discharged on 07/26/2021  Component Date Value Ref Range Status   WBC 07/24/2021 2.8 (A)  4.0 - 10.5 K/uL Final   RBC 07/24/2021 4.00 (A)  4.22 - 5.81 MIL/uL Final   Hemoglobin 07/24/2021 13.5  13.0 - 17.0 g/dL Final   HCT 64/33/2951 40.3  39.0 - 52.0 % Final  MCV 07/24/2021 100.8 (A)  80.0 - 100.0 fL Final   MCH 07/24/2021 33.8  26.0 - 34.0 pg Final   MCHC 07/24/2021 33.5  30.0 - 36.0 g/dL Final   RDW 91/69/4503 17.5 (A)  11.5 - 15.5 % Final   Platelets 07/24/2021 180  150 - 400 K/uL Final   nRBC 07/24/2021 0.0  0.0 - 0.2 % Final   Neutrophils Relative % 07/24/2021 37  % Final   Neutro  Abs 07/24/2021 1.0 (A)  1.7 - 7.7 K/uL Final   Lymphocytes Relative 07/24/2021 49  % Final   Lymphs Abs 07/24/2021 1.4  0.7 - 4.0 K/uL Final   Monocytes Relative 07/24/2021 9  % Final   Monocytes Absolute 07/24/2021 0.2  0.1 - 1.0 K/uL Final   Eosinophils Relative 07/24/2021 4  % Final   Eosinophils Absolute 07/24/2021 0.1  0.0 - 0.5 K/uL Final   Basophils Relative 07/24/2021 1  % Final   Basophils Absolute 07/24/2021 0.0  0.0 - 0.1 K/uL Final   Immature Granulocytes 07/24/2021 0  % Final   Abs Immature Granulocytes 07/24/2021 0.00  0.00 - 0.07 K/uL Final   Performed at [facility], [address], [place], [place] [address]   Sodium 07/24/2021 139  135 - 145 mmol/L Final   Potassium 07/24/2021 3.7  3.5 - 5.1 mmol/L Final   Chloride 07/24/2021 101  98 - 111 mmol/L Final   CO2 07/24/2021 26  22 - 32 mmol/L Final   Glucose, Bld 07/24/2021 98  70 - 99 mg/dL Final   Glucose reference range applies only to samples taken after fasting for at least 8 hours.   BUN 07/24/2021 5 (A)  6 - 20 mg/dL Final   Creatinine, Ser 07/24/2021 0.64  0.61 - 1.24 mg/dL Final   Calcium 00/34/9179 9.0  8.9 - 10.3 mg/dL Final   Total Protein 15/03/6978 7.3  6.5 - 8.1 g/dL Final   Albumin 48/11/6551 4.2  3.5 - 5.0 g/dL Final   AST 74/82/7078 41  15 - 41 U/L Final   ALT 07/24/2021 27  0 - 44 U/L Final   Alkaline Phosphatase 07/24/2021 75  38 - 126 U/L Final   Total Bilirubin 07/24/2021 1.0  0.3 - 1.2 mg/dL Final   GFR, Estimated 07/24/2021 >60  >60 mL/min Final   Comment: (NOTE) Calculated using the CKD-EPI Creatinine Equation (2021)    Anion gap 07/24/2021 12  5 - 15 Final   Performed at [facility], [address], [place], [place] [address]   Alcohol, Ethyl (B) 07/24/2021 231 (A)  <10 mg/dL Final   Comment: (NOTE) Lowest detectable limit for serum alcohol is 10 mg/dL.  For medical purposes only. Performed at [facility], [address], [place], [place] [address]    Salicylate Lvl 07/24/2021 <7.0 (A)  7.0 - 30.0 mg/dL Final   Performed at [facility], [address], [place], [place] [address]   Acetaminophen (Tylenol), Serum 07/24/2021 <10 (A)  10 - 30 ug/mL Final   Comment: (NOTE) Therapeutic concentrations vary significantly. A range of 10-30 ug/mL  may be an effective concentration for many patients. However, some  are best treated at concentrations outside of this range. Acetaminophen concentrations >150 ug/mL at 4 hours after ingestion  and >50 ug/mL at 12 hours after ingestion are often associated with  toxic reactions.  Performed at [facility], [address], [place], [place] [address]    Opiates  07/24/2021 NONE DETECTED  NONE DETECTED Final   Cocaine 07/24/2021 NONE DETECTED  NONE DETECTED Final   Benzodiazepines 07/24/2021 POSITIVE (A)  NONE DETECTED Final   Amphetamines 07/24/2021 NONE DETECTED  NONE DETECTED Final   Tetrahydrocannabinol 07/24/2021 NONE DETECTED  NONE DETECTED Final   Barbiturates 07/24/2021 NONE DETECTED  NONE DETECTED Final   Comment: (NOTE) DRUG SCREEN FOR MEDICAL PURPOSES ONLY.  IF CONFIRMATION IS NEEDED FOR ANY PURPOSE, NOTIFY LAB WITHIN 5 DAYS.  LOWEST DETECTABLE LIMITS FOR URINE DRUG SCREEN Drug Class                     Cutoff (ng/mL) Amphetamine and metabolites    1000 Barbiturate and metabolites    200 Benzodiazepine                 200 Tricyclics and metabolites     300 Opiates and metabolites        300 Cocaine and metabolites        300 THC                            50 Performed at [facility], [address], [place], [place] [address]    SARS Coronavirus 2 by RT PCR 07/24/2021 NEGATIVE  NEGATIVE Final   Comment: (NOTE) SARS-CoV-2 target nucleic acids are NOT DETECTED.  The SARS-CoV-2 RNA is generally detectable in upper respiratory specimens during the acute phase of infection. The  lowest concentration of SARS-CoV-2 viral copies this assay can detect is 138 copies/mL. A negative result does not preclude SARS-Cov-2 infection and should not be used as the sole basis for treatment or other patient management decisions. A negative result may occur with  improper specimen collection/handling, submission of specimen other than nasopharyngeal swab, presence of viral mutation(s) within the areas targeted by this assay, and inadequate number of viral copies(<138 copies/mL). A negative result must be combined with clinical observations, patient history, and epidemiological information. The expected result is Negative.  Fact Sheet for Patients:  BloggerCourse.com  Fact Sheet for Healthcare Providers:  SeriousBroker.it  This test is no                          t yet approved or cleared by the [place] FDA and  has been authorized for detection and/or diagnosis of SARS-CoV-2 by FDA under an Emergency Use Authorization (EUA). This EUA will remain  in effect (meaning this test can be used) for the duration of the COVID-19 declaration under Section 564(b)(1) of the Act, 21 U.S.C.section 360bbb-3(b)(1), unless the authorization is terminated  or revoked sooner.       Influenza A by PCR 07/24/2021 NEGATIVE  NEGATIVE Final   Influenza B by PCR 07/24/2021 NEGATIVE  NEGATIVE Final   Comment: (NOTE) The Xpert Xpress SARS-CoV-2/FLU/RSV plus assay is intended as an aid in the diagnosis of influenza from Nasopharyngeal swab specimens and should not be used as a sole basis for treatment. Nasal washings and aspirates are unacceptable for Xpert Xpress SARS-CoV-2/FLU/RSV testing.  Fact Sheet for Patients: BloggerCourse.com  Fact Sheet for Healthcare Providers: SeriousBroker.it  This test is not yet approved or cleared by the [place] FDA and has been authorized for  detection and/or diagnosis of SARS-CoV-2 by FDA under an Emergency Use Authorization (EUA). This EUA will remain in effect (meaning this test can be used) for the duration of the COVID-19 declaration  under Section 564(b)(1) of the Act, 21 U.S.C. section 360bbb-3(b)(1), unless the authorization is terminated or revoked.  Performed at [facility], [address] [place], [place] [address]    Troponin I (High Sensitivity) 07/25/2021 4  <18 ng/L Final   Comment: (NOTE) Elevated high sensitivity troponin I (hsTnI) values and significant  changes across serial measurements may suggest ACS but many other  chronic and acute conditions are known to elevate hsTnI results.  Refer to the "Links" section for chest pain algorithms and additional  guidance. Performed at [facility], [address] [place], [place] [address]   Admission on 07/20/2021, Discharged on 07/21/2021  Component Date Value Ref Range Status   Alcohol, Ethyl (B) 07/20/2021 308 (A)  <10 mg/dL Final   Comment: CRITICAL RESULT CALLED TO, READ BACK BY AND VERIFIED WITH: NASH, J  BY BATTLET (NOTE) Lowest detectable limit for serum alcohol is 10 mg/dL.  For medical purposes only. Performed at [facility], [address] [place], [place] [address]    WBC 07/20/2021 3.6 (A)  4.0 - 10.5 K/uL Final   RBC 07/20/2021 4.28  4.22 - 5.81 MIL/uL Final   Hemoglobin 07/20/2021 14.4  13.0 - 17.0 g/dL Final   HCT 91/47/8295 42.8  39.0 - 52.0 % Final   MCV 07/20/2021 100.0  80.0 - 100.0 fL Final   MCH 07/20/2021 33.6  26.0 - 34.0 pg Final   MCHC 07/20/2021 33.6  30.0 - 36.0 g/dL Final   RDW 62/13/0865 17.3 (A)  11.5 - 15.5 % Final   Platelets 07/20/2021 274  150 - 400 K/uL Final   nRBC 07/20/2021 0.0  0.0 - 0.2 % Final   Neutrophils Relative % 07/20/2021 42  % Final   Neutro Abs 07/20/2021 1.5 (A)  1.7 - 7.7 K/uL Final   Lymphocytes Relative 07/20/2021 43  % Final    Lymphs Abs 07/20/2021 1.6  0.7 - 4.0 K/uL Final   Monocytes Relative 07/20/2021 9  % Final   Monocytes Absolute 07/20/2021 0.3  0.1 - 1.0 K/uL Final   Eosinophils Relative 07/20/2021 4  % Final   Eosinophils Absolute 07/20/2021 0.1  0.0 - 0.5 K/uL Final   Basophils Relative 07/20/2021 2  % Final   Basophils Absolute 07/20/2021 0.1  0.0 - 0.1 K/uL Final   Immature Granulocytes 07/20/2021 0  % Final   Abs Immature Granulocytes 07/20/2021 0.01  0.00 - 0.07 K/uL Final   Performed at [facility], [address] [place], [place] [address]   Sodium 07/20/2021 138  135 - 145 mmol/L Final   Potassium 07/20/2021 3.6  3.5 - 5.1 mmol/L Final   Chloride 07/20/2021 101  98 - 111 mmol/L Final   CO2 07/20/2021 26  22 - 32 mmol/L Final   Glucose, Bld 07/20/2021 97  70 - 99 mg/dL Final   Glucose reference range applies only to samples taken after fasting for at least 8 hours.   BUN 07/20/2021 5 (A)  6 - 20 mg/dL Final   Creatinine, Ser 07/20/2021 0.61  0.61 - 1.24 mg/dL Final   Calcium 62/95/2841 8.9  8.9 - 10.3 mg/dL Final   Total Protein 32/44/0102 8.1  6.5 - 8.1 g/dL Final   Albumin 72/53/6644 4.3  3.5 - 5.0 g/dL Final   AST 03/47/4259 52 (A)  15 - 41 U/L Final   ALT 07/20/2021 35  0 - 44 U/L Final   Alkaline Phosphatase 07/20/2021 89  38 -  126 U/L Final   Total Bilirubin 07/20/2021 0.9  0.3 - 1.2 mg/dL Final   GFR, Estimated 07/20/2021 >60  >60 mL/min Final   Comment: (NOTE) Calculated using the CKD-EPI Creatinine Equation (2021)    Anion gap 07/20/2021 11  5 - 15 Final   Performed at [facility], [address] [place], [place] [address]   Lipase 07/20/2021 40  11 - 51 U/L Final   Performed at [facility], [address] [place], [place] [address]   SARS Coronavirus 2 by RT PCR 07/21/2021 NEGATIVE  NEGATIVE Final   Comment: (NOTE) SARS-CoV-2 target nucleic acids are NOT DETECTED.  The SARS-CoV-2 RNA is generally detectable in  upper respiratory specimens during the acute phase of infection. The lowest concentration of SARS-CoV-2 viral copies this assay can detect is 138 copies/mL. A negative result does not preclude SARS-Cov-2 infection and should not be used as the sole basis for treatment or other patient management decisions. A negative result may occur with  improper specimen collection/handling, submission of specimen other than nasopharyngeal swab, presence of viral mutation(s) within the areas targeted by this assay, and inadequate number of viral copies(<138 copies/mL). A negative result must be combined with clinical observations, patient history, and epidemiological information. The expected result is Negative.  Fact Sheet for Patients:  BloggerCourse.com  Fact Sheet for Healthcare Providers:  SeriousBroker.it  This test is no                          t yet approved or cleared by the [place] FDA and  has been authorized for detection and/or diagnosis of SARS-CoV-2 by FDA under an Emergency Use Authorization (EUA). This EUA will remain  in effect (meaning this test can be used) for the duration of the COVID-19 declaration under Section 564(b)(1) of the Act, 21 U.S.C.section 360bbb-3(b)(1), unless the authorization is terminated  or revoked sooner.       Influenza A by PCR 07/21/2021 NEGATIVE  NEGATIVE Final   Influenza B by PCR 07/21/2021 NEGATIVE  NEGATIVE Final   Comment: (NOTE) The Xpert Xpress SARS-CoV-2/FLU/RSV plus assay is intended as an aid in the diagnosis of influenza from Nasopharyngeal swab specimens and should not be used as a sole basis for treatment. Nasal washings and aspirates are unacceptable for Xpert Xpress SARS-CoV-2/FLU/RSV testing.  Fact Sheet for Patients: BloggerCourse.com  Fact Sheet for Healthcare Providers: SeriousBroker.it  This test is not yet approved  or cleared by the [place] FDA and has been authorized for detection and/or diagnosis of SARS-CoV-2 by FDA under an Emergency Use Authorization (EUA). This EUA will remain in effect (meaning this test can be used) for the duration of the COVID-19 declaration under Section 564(b)(1) of the Act, 21 U.S.C. section 360bbb-3(b)(1), unless the authorization is terminated or revoked.  Performed at [facility], [address] [place], [place] [address]   Admission on 07/11/2021, Discharged on 07/13/2021  Component Date Value Ref Range Status   SARS Coronavirus 2 by RT PCR 07/11/2021 NEGATIVE  NEGATIVE Final   Comment: (NOTE) SARS-CoV-2 target nucleic acids are NOT DETECTED.  The SARS-CoV-2 RNA is generally detectable in upper respiratory specimens during the acute phase of infection. The lowest concentration of SARS-CoV-2 viral copies this assay can detect is 138 copies/mL. A negative result does not preclude SARS-Cov-2 infection and should not be used as the sole basis for treatment or other patient management decisions. A negative result  may occur with  improper specimen collection/handling, submission of specimen other than nasopharyngeal swab, presence of viral mutation(s) within the areas targeted by this assay, and inadequate number of viral copies(<138 copies/mL). A negative result must be combined with clinical observations, patient history, and epidemiological information. The expected result is Negative.  Fact Sheet for Patients:  BloggerCourse.com  Fact Sheet for Healthcare Providers:  SeriousBroker.it  This test is no                          t yet approved or cleared by the [place] FDA and  has been authorized for detection and/or diagnosis of SARS-CoV-2 by FDA under an Emergency Use Authorization (EUA). This EUA will remain  in effect (meaning this test can be used) for the duration of  the COVID-19 declaration under Section 564(b)(1) of the Act, 21 U.S.C.section 360bbb-3(b)(1), unless the authorization is terminated  or revoked sooner.       Influenza A by PCR 07/11/2021 NEGATIVE  NEGATIVE Final   Influenza B by PCR 07/11/2021 NEGATIVE  NEGATIVE Final   Comment: (NOTE) The Xpert Xpress SARS-CoV-2/FLU/RSV plus assay is intended as an aid in the diagnosis of influenza from Nasopharyngeal swab specimens and should not be used as a sole basis for treatment. Nasal washings and aspirates are unacceptable for Xpert Xpress SARS-CoV-2/FLU/RSV testing.  Fact Sheet for Patients: BloggerCourse.com  Fact Sheet for Healthcare Providers: SeriousBroker.it  This test is not yet approved or cleared by the [place] FDA and has been authorized for detection and/or diagnosis of SARS-CoV-2 by FDA under an Emergency Use Authorization (EUA). This EUA will remain in effect (meaning this test can be used) for the duration of the COVID-19 declaration under Section 564(b)(1) of the Act, 21 U.S.C. section 360bbb-3(b)(1), unless the authorization is terminated or revoked.  Performed at [facility], [address] [place], [place] [address]    Sodium 07/11/2021 139  135 - 145 mmol/L Final   Potassium 07/11/2021 3.8  3.5 - 5.1 mmol/L Final   Chloride 07/11/2021 103  98 - 111 mmol/L Final   CO2 07/11/2021 24  22 - 32 mmol/L Final   Glucose, Bld 07/11/2021 99  70 - 99 mg/dL Final   Glucose reference range applies only to samples taken after fasting for at least 8 hours.   BUN 07/11/2021 <5 (A)  6 - 20 mg/dL Final   Creatinine, Ser 07/11/2021 0.68  0.61 - 1.24 mg/dL Final   Calcium 53/66/4403 8.3 (A)  8.9 - 10.3 mg/dL Final   Total Protein 47/42/5956 6.8  6.5 - 8.1 g/dL Final   Albumin 38/75/6433 3.5  3.5 - 5.0 g/dL Final   AST 29/51/8841 58 (A)  15 - 41 U/L Final   ALT 07/11/2021 42  0 - 44 U/L Final   Alkaline Phosphatase  07/11/2021 85  38 - 126 U/L Final   Total Bilirubin 07/11/2021 0.9  0.3 - 1.2 mg/dL Final   GFR, Estimated 07/11/2021 >60  >60 mL/min Final   Comment: (NOTE) Calculated using the CKD-EPI Creatinine Equation (2021)    Anion gap 07/11/2021 12  5 - 15 Final   Performed at [facility], [address] [place], [place] [address]   Alcohol, Ethyl (B) 07/11/2021 388 (A)  <10 mg/dL Final   Comment: CRITICAL RESULT CALLED TO, READ BACK BY AND VERIFIED WITH: Teryl Lucy RN 07/11/21 1516 M KOROLESKI (NOTE) Lowest detectable limit for serum alcohol is  10 mg/dL.  For medical purposes only. Performed at [facility], [address], [place], [place] [address]    Opiates 07/11/2021 NONE DETECTED  NONE DETECTED Final   Cocaine 07/11/2021 NONE DETECTED  NONE DETECTED Final   Benzodiazepines 07/11/2021 POSITIVE (A)  NONE DETECTED Final   Amphetamines 07/11/2021 NONE DETECTED  NONE DETECTED Final   Tetrahydrocannabinol 07/11/2021 NONE DETECTED  NONE DETECTED Final   Barbiturates 07/11/2021 NONE DETECTED  NONE DETECTED Final   Comment: (NOTE) DRUG SCREEN FOR MEDICAL PURPOSES ONLY.  IF CONFIRMATION IS NEEDED FOR ANY PURPOSE, NOTIFY LAB WITHIN 5 DAYS.  LOWEST DETECTABLE LIMITS FOR URINE DRUG SCREEN Drug Class                     Cutoff (ng/mL) Amphetamine and metabolites    1000 Barbiturate and metabolites    200 Benzodiazepine                 200 Tricyclics and metabolites     300 Opiates and metabolites        300 Cocaine and metabolites        300 THC                            50 Performed at [facility], [address] [place], [place] [address]    WBC 07/11/2021 4.2  4.0 - 10.5 K/uL Final   RBC 07/11/2021 3.43 (A)  4.22 - 5.81 MIL/uL Final   Hemoglobin 07/11/2021 11.5 (A)  13.0 - 17.0 g/dL Final   HCT 46/96/2952 33.9 (A)  39.0 - 52.0 % Final   MCV 07/11/2021 98.8  80.0 - 100.0 fL Final   MCH 07/11/2021 33.5  26.0 - 34.0 pg Final   MCHC 07/11/2021 33.9  30.0 - 36.0  g/dL Final   RDW 84/13/2440 16.4 (A)  11.5 - 15.5 % Final   Platelets 07/11/2021 148 (A)  150 - 400 K/uL Final   nRBC 07/11/2021 0.0  0.0 - 0.2 % Final   Neutrophils Relative % 07/11/2021 60  % Final   Neutro Abs 07/11/2021 2.5  1.7 - 7.7 K/uL Final   Lymphocytes Relative 07/11/2021 27  % Final   Lymphs Abs 07/11/2021 1.1  0.7 - 4.0 K/uL Final   Monocytes Relative 07/11/2021 9  % Final   Monocytes Absolute 07/11/2021 0.4  0.1 - 1.0 K/uL Final   Eosinophils Relative 07/11/2021 3  % Final   Eosinophils Absolute 07/11/2021 0.1  0.0 - 0.5 K/uL Final   Basophils Relative 07/11/2021 1  % Final   Basophils Absolute 07/11/2021 0.1  0.0 - 0.1 K/uL Final   Immature Granulocytes 07/11/2021 0  % Final   Abs Immature Granulocytes 07/11/2021 0.01  0.00 - 0.07 K/uL Final   Performed at [facility], [address], [place], [place] [address]  Admission on 07/07/2021, Discharged on 07/08/2021  Component Date Value Ref Range Status   Alcohol, Ethyl (B) 07/07/2021 352 (A)  <10 mg/dL Final   Comment: CRITICAL RESULT CALLED TO, READ BACK BY AND VERIFIED WITH: RSherrilee Gilles, RN.  [id number] BLANKENSHIP R.  (NOTE) Lowest detectable limit for serum alcohol is 10 mg/dL.  For medical purposes only. Performed at [facility], [address], [place], [place] [address]    Sodium 07/07/2021 138  135 - 145 mmol/L Final   Potassium 07/07/2021 3.5  3.5 - 5.1 mmol/L Final  Chloride 07/07/2021 102  98 - 111 mmol/L Final   CO2 07/07/2021 24  22 - 32 mmol/L Final   Glucose, Bld 07/07/2021 108 (A)  70 - 99 mg/dL Final   Glucose reference range applies only to samples taken after fasting for at least 8 hours.   BUN 07/07/2021 <5 (A)  6 - 20 mg/dL Final   Creatinine, Ser 07/07/2021 0.63  0.61 - 1.24 mg/dL Final   Calcium 74/10/8785 8.8 (A)  8.9 - 10.3 mg/dL Final   Total Protein 76/72/0947 7.0  6.5 - 8.1 g/dL Final   Albumin 09/62/8366 3.5  3.5 - 5.0 g/dL Final   AST 29/47/6546 69 (A)  15 - 41 U/L  Final   ALT 07/07/2021 48 (A)  0 - 44 U/L Final   Alkaline Phosphatase 07/07/2021 68  38 - 126 U/L Final   Total Bilirubin 07/07/2021 0.7  0.3 - 1.2 mg/dL Final   GFR, Estimated 07/07/2021 >60  >60 mL/min Final   Comment: (NOTE) Calculated using the CKD-EPI Creatinine Equation (2021)    Anion gap 07/07/2021 12  5 - 15 Final   Performed at [facility], [address], [place], [place] [address]   Lipase 07/07/2021 54 (A)  11 - 51 U/L Final   Performed at [facility], [address] [place], [place] [address]   WBC 07/07/2021 4.3  4.0 - 10.5 K/uL Final   RBC 07/07/2021 3.58 (A)  4.22 - 5.81 MIL/uL Final   Hemoglobin 07/07/2021 12.0 (A)  13.0 - 17.0 g/dL Final   HCT 27/51/7001 34.4 (A)  39.0 - 52.0 % Final   MCV 07/07/2021 96.1  80.0 - 100.0 fL Final   MCH 07/07/2021 33.5  26.0 - 34.0 pg Final   MCHC 07/07/2021 34.9  30.0 - 36.0 g/dL Final   RDW 74/94/4967 14.6  11.5 - 15.5 % Final   Platelets 07/07/2021 134 (A)  150 - 400 K/uL Final   nRBC 07/07/2021 0.0  0.0 - 0.2 % Final   Neutrophils Relative % 07/07/2021 44  % Final   Neutro Abs 07/07/2021 1.9  1.7 - 7.7 K/uL Final   Lymphocytes Relative 07/07/2021 44  % Final   Lymphs Abs 07/07/2021 1.9  0.7 - 4.0 K/uL Final   Monocytes Relative 07/07/2021 9  % Final   Monocytes Absolute 07/07/2021 0.4  0.1 - 1.0 K/uL Final   Eosinophils Relative 07/07/2021 2  % Final   Eosinophils Absolute 07/07/2021 0.1  0.0 - 0.5 K/uL Final   Basophils Relative 07/07/2021 1  % Final   Basophils Absolute 07/07/2021 0.0  0.0 - 0.1 K/uL Final   Immature Granulocytes 07/07/2021 0  % Final   Abs Immature Granulocytes 07/07/2021 0.01  0.00 - 0.07 K/uL Final   Performed at [facility], [address], [place], [place] [address]   Opiates 07/07/2021 NONE DETECTED  NONE DETECTED Final   Cocaine 07/07/2021 NONE DETECTED  NONE DETECTED Final   Benzodiazepines 07/07/2021 NONE DETECTED  NONE DETECTED Final   Amphetamines 07/07/2021 POSITIVE (A)   NONE DETECTED Final   Tetrahydrocannabinol 07/07/2021 NONE DETECTED  NONE DETECTED Final   Barbiturates 07/07/2021 NONE DETECTED  NONE DETECTED Final   Comment: (NOTE) DRUG SCREEN FOR MEDICAL PURPOSES ONLY.  IF CONFIRMATION IS NEEDED FOR ANY PURPOSE, NOTIFY LAB WITHIN 5 DAYS.  LOWEST DETECTABLE LIMITS FOR URINE DRUG SCREEN Drug Class  Cutoff (ng/mL) Amphetamine and metabolites    1000 Barbiturate and metabolites    200 Benzodiazepine                 200 Tricyclics and metabolites     300 Opiates and metabolites        300 Cocaine and metabolites        300 THC                            50 Performed at [facility], [address] [place], [place] [address]    Color, Urine 07/07/2021 AMBER (A)  YELLOW Final   BIOCHEMICALS MAY BE AFFECTED BY COLOR   APPearance 07/07/2021 HAZY (A)  CLEAR Final   Specific Gravity, Urine 07/07/2021 1.021  1.005 - 1.030 Final   pH 07/07/2021 6.0  5.0 - 8.0 Final   Glucose, UA 07/07/2021 NEGATIVE  NEGATIVE mg/dL Final   Hgb urine dipstick 07/07/2021 SMALL (A)  NEGATIVE Final   Bilirubin Urine 07/07/2021 NEGATIVE  NEGATIVE Final   Ketones, ur 07/07/2021 NEGATIVE  NEGATIVE mg/dL Final   Protein, ur 60/45/4098 30 (A)  NEGATIVE mg/dL Final   Nitrite 11/91/4782 NEGATIVE  NEGATIVE Final   Leukocytes,Ua 07/07/2021 NEGATIVE  NEGATIVE Final   RBC / HPF 07/07/2021 0-5  0 - 5 RBC/hpf Final   WBC, UA 07/07/2021 0-5  0 - 5 WBC/hpf Final   Bacteria, UA 07/07/2021 RARE (A)  NONE SEEN Final   Squamous Epithelial / LPF 07/07/2021 0-5  0 - 5 Final   Mucus 07/07/2021 PRESENT   Final   Hyaline Casts, UA 07/07/2021 PRESENT   Final   Performed at [facility], [address] [place], [place] [address]  Admission on 07/03/2021, Discharged on 07/03/2021  Component Date Value Ref Range Status   Sodium 07/03/2021 141  135 - 145 mmol/L Final   Potassium 07/03/2021 3.7  3.5 - 5.1 mmol/L Final   Chloride 07/03/2021 107  98 - 111 mmol/L  Final   CO2 07/03/2021 24  22 - 32 mmol/L Final   Glucose, Bld 07/03/2021 100 (A)  70 - 99 mg/dL Final   Glucose reference range applies only to samples taken after fasting for at least 8 hours.   BUN 07/03/2021 5 (A)  6 - 20 mg/dL Final   Creatinine, Ser 07/03/2021 0.62  0.61 - 1.24 mg/dL Final   Calcium 30/86/5784 8.5 (A)  8.9 - 10.3 mg/dL Final   GFR, Estimated 07/03/2021 >60  >60 mL/min Final   Comment: (NOTE) Calculated using the CKD-EPI Creatinine Equation (2021)    Anion gap 07/03/2021 10  5 - 15 Final   Performed at [facility], [address], [place], [place] [address]   WBC 07/03/2021 5.1  4.0 - 10.5 K/uL Final   RBC 07/03/2021 4.03 (A)  4.22 - 5.81 MIL/uL Final   Hemoglobin 07/03/2021 13.2  13.0 - 17.0 g/dL Final   HCT 52/84/1324 38.2 (A)  39.0 - 52.0 % Final   MCV 07/03/2021 94.8  80.0 - 100.0 fL Final   MCH 07/03/2021 32.8  26.0 - 34.0 pg Final   MCHC 07/03/2021 34.6  30.0 - 36.0 g/dL Final   RDW 40/08/2724 13.2  11.5 - 15.5 % Final   Platelets 07/03/2021 111 (A)  150 - 400 K/uL Final   Comment: Immature Platelet Fraction may be clinically indicated, consider ordering this additional test [id number] REPEATED TO VERIFY PLATELET  COUNT CONFIRMED BY SMEAR    nRBC 07/03/2021 0.0  0.0 - 0.2 % Final   Performed at [facility], [address] [place], [place] [address]   Troponin I (High Sensitivity) 07/03/2021 6  <18 ng/L Final   Comment: (NOTE) Elevated high sensitivity troponin I (hsTnI) values and significant  changes across serial measurements may suggest ACS but many other  chronic and acute conditions are known to elevate hsTnI results.  Refer to the "Links" section for chest pain algorithms and additional  guidance. Performed at [facility] [place] [facility], [address], [place], [place] [address]    Alcohol, Ethyl (B) 07/03/2021 134 (A)  <10 mg/dL Final   Comment: (NOTE) Lowest detectable limit for serum alcohol is 10 mg/dL.  For medical purposes  only. Performed at [facility], [address] [place], [place] [address]    Opiates 07/03/2021 NONE DETECTED  NONE DETECTED Final   Cocaine 07/03/2021 NONE DETECTED  NONE DETECTED Final   Benzodiazepines 07/03/2021 NONE DETECTED  NONE DETECTED Final   Amphetamines 07/03/2021 NONE DETECTED  NONE DETECTED Final   Tetrahydrocannabinol 07/03/2021 NONE DETECTED  NONE DETECTED Final   Barbiturates 07/03/2021 NONE DETECTED  NONE DETECTED Final   Comment: (NOTE) DRUG SCREEN FOR MEDICAL PURPOSES ONLY.  IF CONFIRMATION IS NEEDED FOR ANY PURPOSE, NOTIFY LAB WITHIN 5 DAYS.  LOWEST DETECTABLE LIMITS FOR URINE DRUG SCREEN Drug Class                     Cutoff (ng/mL) Amphetamine and metabolites    1000 Barbiturate and metabolites    200 Benzodiazepine                 200 Tricyclics and metabolites     300 Opiates and metabolites        300 Cocaine and metabolites        300 THC                            50 Performed at [facility], [address] [place], [place] [address]   Admission on 06/30/2021, Discharged on 07/01/2021  Component Date Value Ref Range Status   SARS Coronavirus 2 by RT PCR 06/30/2021 NEGATIVE  NEGATIVE Final   Comment: (NOTE) SARS-CoV-2 target nucleic acids are NOT DETECTED.  The SARS-CoV-2 RNA is generally detectable in upper respiratory specimens during the acute phase of infection. The lowest concentration of SARS-CoV-2 viral copies this assay can detect is 138 copies/mL. A negative result does not preclude SARS-Cov-2 infection and should not be used as the sole basis for treatment or other patient management decisions. A negative result may occur with  improper specimen collection/handling, submission of specimen other than nasopharyngeal swab, presence of viral mutation(s) within the areas targeted by this assay, and inadequate number of viral copies(<138 copies/mL). A negative result must be combined with clinical observations, patient  history, and epidemiological information. The expected result is Negative.  Fact Sheet for Patients:  BloggerCourse.com  Fact Sheet for Healthcare Providers:  SeriousBroker.it  This test is no                          t yet approved or cleared by the [place] FDA and  has been authorized for detection and/or diagnosis of SARS-CoV-2 by FDA under an Emergency Use Authorization (EUA). This EUA will remain  in effect (  meaning this test can be used) for the duration of the COVID-19 declaration under Section 564(b)(1) of the Act, 21 U.S.C.section 360bbb-3(b)(1), unless the authorization is terminated  or revoked sooner.       Influenza A by PCR 06/30/2021 NEGATIVE  NEGATIVE Final   Influenza B by PCR 06/30/2021 NEGATIVE  NEGATIVE Final   Comment: (NOTE) The Xpert Xpress SARS-CoV-2/FLU/RSV plus assay is intended as an aid in the diagnosis of influenza from Nasopharyngeal swab specimens and should not be used as a sole basis for treatment. Nasal washings and aspirates are unacceptable for Xpert Xpress SARS-CoV-2/FLU/RSV testing.  Fact Sheet for Patients: BloggerCourse.com  Fact Sheet for Healthcare Providers: SeriousBroker.it  This test is not yet approved or cleared by the [place] FDA and has been authorized for detection and/or diagnosis of SARS-CoV-2 by FDA under an Emergency Use Authorization (EUA). This EUA will remain in effect (meaning this test can be used) for the duration of the COVID-19 declaration under Section 564(b)(1) of the Act, 21 U.S.C. section 360bbb-3(b)(1), unless the authorization is terminated or revoked.  Performed at [facility], [address] [place], [place] [address]    Sodium 06/30/2021 138  135 - 145 mmol/L Final   Potassium 06/30/2021 3.3 (A)  3.5 - 5.1 mmol/L Final   Chloride 06/30/2021 104  98 - 111 mmol/L Final   CO2  06/30/2021 22  22 - 32 mmol/L Final   Glucose, Bld 06/30/2021 109 (A)  70 - 99 mg/dL Final   Glucose reference range applies only to samples taken after fasting for at least 8 hours.   BUN 06/30/2021 5 (A)  6 - 20 mg/dL Final   Creatinine, Ser 06/30/2021 0.75  0.61 - 1.24 mg/dL Final   Calcium 60/45/4098 9.0  8.9 - 10.3 mg/dL Final   Total Protein 11/91/4782 7.5  6.5 - 8.1 g/dL Final   Albumin 95/62/1308 4.1  3.5 - 5.0 g/dL Final   AST 65/78/4696 49 (A)  15 - 41 U/L Final   ALT 06/30/2021 35  0 - 44 U/L Final   Alkaline Phosphatase 06/30/2021 74  38 - 126 U/L Final   Total Bilirubin 06/30/2021 0.5  0.3 - 1.2 mg/dL Final   GFR, Estimated 06/30/2021 >60  >60 mL/min Final   Comment: (NOTE) Calculated using the CKD-EPI Creatinine Equation (2021)    Anion gap 06/30/2021 12  5 - 15 Final   Performed at [facility], [address], [place], [place] [address]   Alcohol, Ethyl (B) 06/30/2021 381 (A)  <10 mg/dL Final   Comment: CRITICAL RESULT CALLED TO, READ BACK BY AND VERIFIED WITH: L SHULAR RN BY SSTEPHENS [phone] (NOTE) Lowest detectable limit for serum alcohol is 10 mg/dL.  For medical purposes only. Performed at [facility], [address], [place], [place] [address]    WBC 06/30/2021 4.4  4.0 - 10.5 K/uL Final   RBC 06/30/2021 4.15 (A)  4.22 - 5.81 MIL/uL Final   Hemoglobin 06/30/2021 13.4  13.0 - 17.0 g/dL Final   HCT 40/08/2724 40.1  39.0 - 52.0 % Final   MCV 06/30/2021 96.6  80.0 - 100.0 fL Final   MCH 06/30/2021 32.3  26.0 - 34.0 pg Final   MCHC 06/30/2021 33.4  30.0 - 36.0 g/dL Final   RDW 36/64/4034 12.9  11.5 - 15.5 % Final   Platelets 06/30/2021 132 (A)  150 - 400 K/uL Final   nRBC 06/30/2021 0.0  0.0 - 0.2 %  Final   Neutrophils Relative % 06/30/2021 47  % Final   Neutro Abs 06/30/2021 2.0  1.7 - 7.7 K/uL Final   Lymphocytes Relative 06/30/2021 44  % Final   Lymphs Abs 06/30/2021 1.9  0.7 - 4.0 K/uL Final   Monocytes Relative 06/30/2021 5  % Final    Monocytes Absolute 06/30/2021 0.2  0.1 - 1.0 K/uL Final   Eosinophils Relative 06/30/2021 3  % Final   Eosinophils Absolute 06/30/2021 0.1  0.0 - 0.5 K/uL Final   Basophils Relative 06/30/2021 1  % Final   Basophils Absolute 06/30/2021 0.0  0.0 - 0.1 K/uL Final   Immature Granulocytes 06/30/2021 0  % Final   Abs Immature Granulocytes 06/30/2021 0.01  0.00 - 0.07 K/uL Final   Performed at [facility], [address] [place] [address] [place], [place] [address]   Acetaminophen (Tylenol), Serum 06/30/2021 <10 (A)  10 - 30 ug/mL Final   Comment: (NOTE) Therapeutic concentrations vary significantly. A range of 10-30 ug/mL  may be an effective concentration for many patients. However, some  are best treated at concentrations outside of this range. Acetaminophen concentrations >150 ug/mL at 4 hours after ingestion  and >50 ug/mL at 12 hours after ingestion are often associated with  toxic reactions.  Performed at [facility], [address], [place], [place] [address]    Salicylate Lvl 06/30/2021 <7.0 (A)  7.0 - 30.0 mg/dL Final   Performed at [facility], [address], [place], [place] [address]   Total CK 06/30/2021 820 (A)  49 - 397 U/L Final   Performed at [facility], [address], [place], [place] [address]  There may be more visits with results that are not included.    Blood Alcohol level:  Lab Results  Component Value Date   ETH 333 (HH) 08/26/2021   ETH 281 (H) 08/08/2021    Metabolic Disorder Labs: Lab Results  Component Value Date   HGBA1C 4.3 (L) 07/26/2021   MPG 76.71 07/26/2021   MPG 99.67 03/12/2021   No results found for: PROLACTIN Lab Results  Component Value Date   CHOL 198 07/26/2021   TRIG 209 (H) 07/26/2021   HDL 52 07/26/2021   CHOLHDL 3.8 07/26/2021   VLDL 42 (H) 07/26/2021   LDLCALC 104 (H) 07/26/2021   LDLCALC 79 09/19/2019    Therapeutic Lab Levels: No results found for: LITHIUM No results found for: VALPROATE No components  found for:  CBMZ  Physical Findings   AIMS    Flowsheet Row [facility]) [facility]  AIMS Total Score 0 17      AUDIT    Flowsheet Row [facility]) [facility]2020 [facility] Admission (Discharged[facility]  Alcohol Use Disorder Identification Test Final Score (AUDIT) 25 39 36      PHQ2-9    Flowsheet Row ED from 02/11/2021 in [facility] EMERGENCY DEPARTMENT  PHQ-2 Total Score 6  PHQ-9 Total Score 23      Flowsheet Row ED from 08/26/2021 in [facility] ED from 08/25/2021 in [facility] ED from 08/08/2021 in [facility] EMERGENCY DEPARTMENT  C-SSRS RISK CATEGORY Low Risk High Risk Error: Question 6 not populated  Musculoskeletal  Strength & Muscle Tone: within normal limits Gait & Station: normal Patient leans: N/A  Psychiatric Specialty Exam  Presentation  General Appearance: Appropriate for Environment; Casual  Eye Contact:Fair  Speech:Clear and Coherent; Normal Rate  Speech Volume:Normal  Handedness:Right   Mood and Affect  Mood:Dysphoric; Irritable  Affect:Appropriate; Congruent; Labile   Thought Process  Thought Processes:Coherent; Goal Directed; Linear  Descriptions of Associations:Intact  Orientation:Full (Time, Place and Person)  Thought Content:WDL; Logical  Diagnosis of Schizophrenia or Schizoaffective disorder in past: No  Duration of Psychotic Symptoms: Less than six months   Hallucinations:Hallucinations: None  Ideas of Reference:None  Suicidal Thoughts:Suicidal Thoughts: No  Homicidal Thoughts:Homicidal Thoughts:  No   Sensorium  Memory:Immediate Good; Recent Fair; Remote Fair  Judgment:Fair  Insight:Fair   Executive Functions  Concentration:Fair  Attention Span:Fair  Recall:Fair  Fund of Knowledge:Fair  Language:Fair   Psychomotor Activity  Psychomotor Activity:Psychomotor Activity: Normal   Assets  Assets:Communication Skills; Desire for Improvement; Resilience   Sleep  Sleep:Sleep: Fair   No data recorded  Physical Exam  Physical Exam Constitutional:      Appearance: Normal appearance. He is normal weight.  HENT:     Head: Normocephalic and atraumatic.  Eyes:     Extraocular Movements: Extraocular movements intact.  Pulmonary:     Effort: Pulmonary effort is normal.  Neurological:     General: No focal deficit present.     Mental Status: He is alert and oriented to person, place, and time.  Psychiatric:        Attention and Perception: Attention and perception normal.        Speech: Speech normal.        Behavior: Behavior normal. Behavior is cooperative.        Thought Content: Thought content normal.   Review of Systems  Constitutional:  Negative for chills, diaphoresis and fever.  HENT:  Negative for hearing loss.   Eyes:  Negative for discharge and redness.  Respiratory:  Negative for cough.   Cardiovascular:  Negative for chest pain.  Gastrointestinal:  Positive for abdominal pain, constipation and nausea. Negative for vomiting.       Mild nausea  Musculoskeletal:  Negative for myalgias.  Neurological:  Negative for dizziness, tremors and headaches.  Psychiatric/Behavioral:  Positive for substance abuse. Negative for hallucinations and suicidal ideas.   Blood pressure 109/78, pulse 82, temperature (!) 97.1 F (36.2 C), temperature source Oral, resp. rate 18, SpO2 100 %. There is no height or weight on file to calculate BMI.  Treatment Plan Summary: 35 year old male with history of substance use, depression, HIV, who presented to [facility] ED on  10/17 for SI, patient was found attempting to jump into traffic and punching traffic signs.  Patient reported he was suicidal with a plan to stab himself at that time.  Patient reported drinking 6-7 bottles of wine a day last drink being prior to presentation.  It was assessed by TTS.  Patient had reported that for the past 2 weeks he was drinking 6 fifths of vodka daily for the past 2 days he been drinking 6 bottles of wine.  Patient was initially, however patient has been calm.  Patient was medically cleared in the ED and was accepted to [facility] for further treatment on 10/18. Etoh 333 and UDS+BZD + on presentation.   Patient continues to deny SI/HI/AVH today and remains amenable to receiving assistance with detox-is tolerating ativan taper well which is currently scheduled to end on Saturday  Has  has not needed any PRN ativan. Patient is appropriate for [facility] at this time for continued safe detox in a controlled enviornment   Alcohol use disorder , severe SIMD -continue  ativan taper- 1 mg QID; 1 mg TID, 1 mg BID; 1 mg once -detox protocol -restart gabapentin 300 mg TID -vitamin and thiamine supplementation  Constipation -milk of magnesia PRN -added miralax prn   HIV -continue biktarvy   Dispo: likely self care on Saturday/sunday after completion of ativan taper. Patient is declining substance use treatment and is not interested in residential rehab.   Estella Husk, MD 08/28/2021 1:34 PM

## 2021-08-29 NOTE — ED Notes (Signed)
Pt sleeping at present, no distress noted.  Monitoring for safety. 

## 2021-08-29 NOTE — ED Notes (Signed)
Patient participated in group  this evening and was excited about being here for help. He states "this is such a great place to be". He is feeling more positive about when he leaves and realizes that he can only worry about himself and wants to focus on his future and being more positive.

## 2021-08-29 NOTE — Progress Notes (Signed)
Patient sleeping.  Respirations even and unlabored. Continue to monitor for safety. °

## 2021-08-30 DIAGNOSIS — F10230 Alcohol dependence with withdrawal, uncomplicated: Secondary | ICD-10-CM | POA: Diagnosis not present

## 2021-08-30 DIAGNOSIS — F1994 Other psychoactive substance use, unspecified with psychoactive substance-induced mood disorder: Secondary | ICD-10-CM | POA: Diagnosis not present

## 2021-08-30 DIAGNOSIS — Z21 Asymptomatic human immunodeficiency virus [HIV] infection status: Secondary | ICD-10-CM | POA: Diagnosis not present

## 2021-08-30 DIAGNOSIS — R45851 Suicidal ideations: Secondary | ICD-10-CM | POA: Diagnosis not present

## 2021-08-30 MED ORDER — BICTEGRAVIR-EMTRICITAB-TENOFOV 50-200-25 MG PO TABS: 1.0000 | ORAL_TABLET | Freq: Every day | ORAL | 0 refills | Status: DC

## 2021-08-30 NOTE — ED Notes (Signed)
Patient denies SI/HI. Patient was discharged to home today. Patient was given discharge instructions. Patient was given a bus pass for transportation. Patient was stable on the unit prior to discharge.

## 2021-08-30 NOTE — ED Notes (Signed)
Pt eating breakfast 

## 2021-08-30 NOTE — ED Notes (Signed)
Pt sleeping no distress noted respiration easy no broken sleep.

## 2021-08-30 NOTE — Progress Notes (Signed)
Patient visible on unit.  Pleasant.  Stated that he feels better today.  Denied SI, HI, AVH and depression.  Rated anxiety 10/10.  Medication compliant.  Continue to monitor for safety.

## 2021-08-30 NOTE — ED Provider Notes (Signed)
[facility] Discharge Suicide Risk Assessment   Principal Problem: <principal problem not specified> Discharge Diagnoses: Active Problems:   Alcohol use disorder, severe, dependence (HCC)   Substance induced mood disorder (HCC)   Subjective: Gary [name], 35 y.o., male patient seen face to face by this provider, chart reviewed and discussed with Dr. Hedy Camara on 08/30/21.  On evaluation Gary [name] states, "I feel great".  Patient was admitted to the [facility] from the [facility] long ED on 10/18.  Patient was initially as and UDS was positive positive for BZD and BAL was 333.   On assessment patient is sitting in the day room.  He is fairly groomed and makes good eye contact. He is calm/cooperative, alert/oriented x 4, with pleasant affect.  He denies depression or anxiety at this time.  Denies any concerns with appetite or sleep.  He denies not appear to be responding to internal/external stimuli.  He denies auditory and visual hallucinations.  Denies homicidal/suicidal ideations.  Denies access to firearms/weapons.  Patient contracts for safety. He is tolerating medications without adverse reactions, and attending/participating in group sessions. Patient's feelings and progress discussed.     Declines this writer to contact any collateral.  Total Time spent with patient: 30 minutes  Musculoskeletal: Strength & Muscle Tone: within normal limits Gait & Station: normal Patient leans: N/A  Psychiatric Specialty Exam  Presentation  General Appearance: Appropriate for Environment; Fairly Groomed  Eye Contact:Good  Speech:Clear and Coherent; Normal Rate  Speech Volume:Normal  Handedness:Right   Mood and Affect  Mood:Euthymic  Duration of Depression Symptoms: Greater than two weeks  Affect:Congruent   Thought Process  Thought Processes:Coherent  Descriptions of Associations:Intact  Orientation:Full (Time, Place and Person)  Thought Content:Logical; WDL  History of  Schizophrenia/Schizoaffective disorder:No  Duration of Psychotic Symptoms:Less than six months  Hallucinations:Hallucinations: None  Ideas of Reference:None  Suicidal Thoughts:Suicidal Thoughts: No  Homicidal Thoughts:Homicidal Thoughts: No   Sensorium  Memory:Immediate Good; Recent Good; Remote Good  Judgment:Fair  Insight:Fair   Executive Functions  Concentration:Good  Attention Span:Good  Recall:Good  Fund of Knowledge:Good  Language:Good   Psychomotor Activity  Psychomotor Activity:Psychomotor Activity: Normal   Assets  Assets:Communication Skills; Desire for Improvement; Leisure Time; Physical Health; Resilience   Sleep  Sleep:Sleep: Good Number of Hours of Sleep: 7   Physical Exam: Physical Exam see discharge summary ROSsee discharge summary Blood pressure 105/69, pulse 67, temperature 97.7 F (36.5 C), temperature source Oral, resp. rate 18, SpO2 98 %. There is no height or weight on file to calculate BMI.  Mental Status Per Nursing Assessment::   On Admission:   see H&P per Dr. Hedy Camara on 08/26/2021  35 year old male with history of substance use, depression, HIV, who presented to [facility] ED on 10/17 for SI, patient was found attempting to jump into traffic and punching traffic signs.  Patient reported he was suicidal with a plan to stab himself at that time.  Patient reported drinking 6-7 bottles of wine a day last drink being prior to presentation.  It was assessed by TTS.  Patient had reported that for the past 2 weeks he was drinking 6 fifths of vodka daily for the past 2 days he been drinking 6 bottles of wine.  Patient was initially, however patient has been calm.  Patient was accepted to [facility] for further treatment on 10/18. Etoh 333 and UDS+BZD + on presentation.   Per chart review patient was hospitalized at [facility] from 07/26/2021-07/29/21 for SI and alcohol use.  Patient was discharged with Biktarvy, doxepin 10 mg nightly, folic  acid 1 mg daily, gabapentin 300 mg 3 times daily for alcohol use disorder, multivitamin, pantoprazole 40 mg daily, thiamine 100 mg.  Patient has had several presentations related to alcohol use.   Patient interviewed in office this afternoon.  Patient is calm, cooperative, and pleasant.  Patient states that he is "not 100% sure" about how he ended up at the hospital.  Patient states he believes he told a friend that he wanted assistance with drinking who then brought him to the hospital.  Patient reports drinking 6-7 bottles of wine daily for approximately the last month.  Patient states that [facility] [place] up until about a month ago when he resumed drinking.  Patient also reports that he also consumes. 3.  Patient states his last drink was about was over 24 hours ago.  He reports current withdrawal symptoms of nausea, weakness, tremors.  Patient recalls recent admission to [facility] behavioral health.  He states that upon discharge he was not provided any scripts for medications and therefore has not been taking medications as prescribed.  Patient is a poor historian and is unable to recall if he went to [facility] before or after his admission at [facility].  Patient denies SI/HI/AVH.  Patient denies paranoia.  Discussed with patient what services he would like upon discharge-patient indicates he is unsure if he would like to attend rehab as he would like to get back to work since he is able.  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  Patient states that he would be interested in housing resources upon discharge as he is currently homeless Demographic Factors:  Male, Caucasian, Low socioeconomic status, Living alone, and Unemployed  Loss Factors: Financial problems/change in socioeconomic status  Historical Factors: Impulsivity  Risk Reduction Factors:   Sense of responsibility to family, Religious beliefs about death, Positive  social support, Positive therapeutic relationship, and Positive coping skills or problem solving skills  Continued Clinical Symptoms:  Severe Anxiety and/or Agitation Depression:   Comorbid alcohol abuse/dependence Impulsivity Alcohol/Substance Abuse/Dependencies  Cognitive Features That Contribute To Risk:  None    Suicide Risk:  Minimal: No identifiable suicidal ideation.  Patients presenting with no risk factors but with morbid ruminations; may be classified as minimal risk based on the severity of the depressive symptoms   Follow-up Information     Patient Denied Any Outpatient Follow Up Follow up.   Why: Patient Denied Any Outpatient Follow Up                Plan Of Care/Follow-up recommendations:  Activity:  as toelrated  Diet:  regular  Discharge patient    3-day sample for Biktarvy provided.  Patient will follow-up with infectious disease Dr. Algis Liming   Outpatient psychiatric resources provided for [facility] behavioral health outpatient services on second floor including open access walk-in hours.  Provided IRC, mobile crisis unit number, and [place] and wellness for primary care needs.    Patient declines resources for substance abuse treatment.   No evidence of imminent risk to self or others at present.    Patient does not meet criteria for psychiatric inpatient admission. Discussed crisis plan, support from social network, calling 911, coming to the Emergency Department, and calling Suicide Hotline.     Ardis Hughs, NP 08/30/2021, 12:14 PM

## 2021-08-30 NOTE — ED Provider Notes (Signed)
FBC/OBS ASAP Discharge Summary  Date and Time: 08/30/2021 12:12 PM  Name: Gary [name]  MRN:  [id number]   Discharge Diagnoses:  Final diagnoses:  Substance induced mood disorder (HCC)  Alcohol withdrawal syndrome without complication (HCC)  Alcohol use disorder, severe, dependence (HCC)    Subjective: Gary [name], 35 y.o., male patient seen face to face by this provider, chart reviewed and discussed with Dr. Hedy Camara on 08/30/21.  On evaluation Gary [name] states, "I feel great".  Patient was admitted to the [facility] from the [facility] long ED on 10/18.  Patient was initially as and UDS was positive positive for BZD and BAL was 333.  On assessment patient is sitting in the day room.  He is fairly groomed and makes good eye contact. He is calm/cooperative, alert/oriented x 4, with pleasant affect.  He denies depression or anxiety at this time.  Denies any concerns with appetite or sleep.  He denies not appear to be responding to internal/external stimuli.  He denies auditory and visual hallucinations.  Denies homicidal/suicidal ideations.  Denies access to firearms/weapons.  Patient contracts for safety. He is tolerating medications without adverse reactions, and attending/participating in group sessions. Patient's feelings and progress discussed.    Declines this writer to contact any collateral.  Stay Summary: Gary [name] was admitted to [facility] SI and crisis management.  His home medications were restarted and tolerated without any adverse reactions.  Upon discharge patient states the only medication that he will take his Biktarvy.  He was provided with a 3-day sample and instructions to follow-up with his infectious disease doctor Dr. Algis Liming.  Patient agrees.  Gary [name]'s  report of symptom reduction, emotional and mental status was monitored by staff.   He was evaluated for stability and plans for continued recovery upon discharge. The following was addressed as part of his  discharge planning and follow up treatment:  Employment, housing, transportation, bed availability, health status, family support, and any pending legal issues were also considered during his during the continuous assessment/observation. He was offered further treatment options upon discharge including but not limited to Residential, Intensive Outpatient, Outpatient treatment, Rehabilitation services, and resources for shelters and [facility]-way-house if needed. He declined.  States he has friends he is going to stay with upon discharge. Gary [name] will follow up with the services as listed below under Follow up Information.     Upon completion of this admission the Gary [name] was both mentally and medically stable for discharge denying suicidal/homicidal ideation, auditory/visual/tactile hallucinations, delusional thoughts and paranoia.     Total Time spent with patient: 30 minutes  Past Psychiatric History: see h&P Past Medical History:  Past Medical History:  Diagnosis Date   Alcohol abuse    HIV (human immunodeficiency virus infection) (HCC)    Seizures (HCC)    Subdural hematoma     Past Surgical History:  Procedure Laterality Date   INCISION AND DRAINAGE PERIRECTAL ABSCESS N/A 09/24/2016   Procedure: IRRIGATION AND DEBRIDEMENT PERIRECTAL ABSCESS;  Surgeon: Ricarda Frame, MD;  Location: [facility] ORS;  Service: General;  Laterality: N/A;   none     Family History:  Family History  Problem Relation Age of Onset   Alcohol abuse Mother    Alcohol abuse Father    Family Psychiatric History: see H&P Social History:  Social History   Substance and Sexual Activity  Alcohol Use Yes   Comment: "I drink all I can"     Social History  Substance and Sexual Activity  Drug Use Yes   Types: Methamphetamines, Marijuana   Comment: last used 04/10/2019    Social History   Socioeconomic History   Marital status: Single    Spouse name: Not on file   Number of children: Not on file    Years of education: Not on file   Highest education level: Not on file  Occupational History   Occupation: unemployed  Tobacco Use   Smoking status: Every Day    Packs/day: 0.50    Types: Cigarettes   Smokeless tobacco: Never   Tobacco comments:    unable to smoke while incarcerated 6+ months 02/13/20  Vaping Use   Vaping Use: Never used  Substance and Sexual Activity   Alcohol use: Yes    Comment: "I drink all I can"   Drug use: Yes    Types: Methamphetamines, Marijuana    Comment: last used 04/10/2019   Sexual activity: Yes    Partners: Male, Male    Comment: declined condoms  03/2021  Other Topics Concern   Not on file  Social History Narrative   "Currently living on the streets"   Independent at baseline   Social Determinants of Health   Financial Resource Strain: Not on file  Food Insecurity: Not on file  Transportation Needs: Not on file  Physical Activity: Not on file  Stress: Not on file  Social Connections: Not on file   SDOH:  SDOH Screenings   Alcohol Screen: Medium Risk   Last Alcohol Screening Score (AUDIT): 25  Depression (PHQ2-9): Medium Risk   PHQ-2 Score: 23  Financial Resource Strain: Not on file  Food Insecurity: Not on file  Housing: Not on file  Physical Activity: Not on file  Social Connections: Not on file  Stress: Not on file  Tobacco Use: High Risk   Smoking Tobacco Use: Every Day   Smokeless Tobacco Use: Never   Passive Exposure: Not on file  Transportation Needs: Not on file    Tobacco Cessation:  N/A, patient does not currently use tobacco products  Current Medications:  Current Facility-Administered Medications  Medication Dose Route Frequency Provider Last Rate Last Admin   acetaminophen (TYLENOL) tablet 650 mg  650 mg Oral Q6H PRN Estella Husk, MD   650 mg at 08/28/21 1006   alum & mag hydroxide-simeth (MAALOX/MYLANTA) 200-200-20 MG/5ML suspension 30 mL  30 mL Oral Q4H PRN Estella Husk, MD        bictegravir-emtricitabine-tenofovir AF (BIKTARVY) 50-200-25 MG per tablet 1 tablet  1 tablet Oral Daily Estella Husk, MD   1 tablet at 08/30/21 0932   gabapentin (NEURONTIN) capsule 300 mg  300 mg Oral TID Estella Husk, MD   300 mg at 08/30/21 0932   hydrOXYzine (ATARAX/VISTARIL) tablet 25 mg  25 mg Oral TID PRN Estella Husk, MD   25 mg at 08/30/21 0615   magnesium hydroxide (MILK OF MAGNESIA) suspension 30 mL  30 mL Oral Daily PRN Estella Husk, MD   30 mL at 08/29/21 0956   multivitamin with minerals tablet 1 tablet  1 tablet Oral Daily Estella Husk, MD   1 tablet at 08/30/21 0932   pantoprazole (PROTONIX) EC tablet 20 mg  20 mg Oral BID Estella Husk, MD   20 mg at 08/30/21 0933   polyethylene glycol (MIRALAX / GLYCOLAX) packet 17 g  17 g Oral Daily PRN Estella Husk, MD   17 g at 08/29/21 1116  promethazine (PHENERGAN) tablet 25 mg  25 mg Oral Q6H PRN Estella Husk, MD   25 mg at 08/28/21 1101   thiamine tablet 100 mg  100 mg Oral Daily Estella Husk, MD   100 mg at 08/30/21 7793   traZODone (DESYREL) tablet 100 mg  100 mg Oral QHS PRN Estella Husk, MD   100 mg at 08/29/21 2139   Current Outpatient Medications  Medication Sig Dispense Refill   benztropine (COGENTIN) 1 MG tablet Take 1 tablet (1 mg total) by mouth 2 (two) times daily. (Patient not taking: Reported on 08/26/2021) 60 tablet 0   bictegravir-emtricitabine-tenofovir AF (BIKTARVY) 50-200-25 MG TABS tablet Take 1 tablet by mouth daily after lunch. 30 tablet 0   doxepin (SINEQUAN) 10 MG capsule Take 1 capsule (10 mg total) by mouth at bedtime. (Patient not taking: Reported on 08/26/2021) 30 capsule 0   folic acid (FOLVITE) 1 MG tablet Take 1 tablet (1 mg total) by mouth daily. (Patient not taking: Reported on 08/26/2021)     gabapentin (NEURONTIN) 300 MG capsule Take 1 capsule (300 mg total) by mouth 3 (three) times daily. (Patient not taking: Reported on  08/26/2021) 90 capsule 2   Multiple Vitamin (MULTIVITAMIN WITH MINERALS) TABS tablet Take 1 tablet by mouth daily. (Patient not taking: Reported on 08/26/2021)     pantoprazole (PROTONIX) 20 MG tablet Take 1 tablet (20 mg total) by mouth 2 (two) times daily. (Patient not taking: Reported on 08/26/2021) 30 tablet 0   thiamine 100 MG tablet Take 1 tablet (100 mg total) by mouth daily. (Patient not taking: Reported on 08/26/2021) 30 tablet 0    PTA Medications: (Not in a hospital admission)   Musculoskeletal  Strength & Muscle Tone: within normal limits Gait & Station: normal Patient leans: N/A  Psychiatric Specialty Exam  Presentation  General Appearance: Appropriate for Environment; Fairly Groomed  Eye Contact:Good  Speech:Clear and Coherent; Normal Rate  Speech Volume:Normal  Handedness:Right   Mood and Affect  Mood:Euthymic  Affect:Congruent   Thought Process  Thought Processes:Coherent  Descriptions of Associations:Intact  Orientation:Full (Time, Place and Person)  Thought Content:Logical; WDL  Diagnosis of Schizophrenia or Schizoaffective disorder in past: No  Duration of Psychotic Symptoms: Less than six months   Hallucinations:Hallucinations: None  Ideas of Reference:None  Suicidal Thoughts:Suicidal Thoughts: No  Homicidal Thoughts:Homicidal Thoughts: No   Sensorium  Memory:Immediate Good; Recent Good; Remote Good  Judgment:Fair  Insight:Fair   Executive Functions  Concentration:Good  Attention Span:Good  Recall:Good  Fund of Knowledge:Good  Language:Good   Psychomotor Activity  Psychomotor Activity:Psychomotor Activity: Normal   Assets  Assets:Communication Skills; Desire for Improvement; Leisure Time; Physical Health; Resilience   Sleep  Sleep:Sleep: Good Number of Hours of Sleep: 7   No data recorded  Physical Exam  Physical Exam Vitals and nursing note reviewed.  Constitutional:      Appearance: He is well-developed.   HENT:     Head: Normocephalic and atraumatic.  Eyes:     Conjunctiva/sclera: Conjunctivae normal.  Cardiovascular:     Rate and Rhythm: Normal rate.  Pulmonary:     Effort: Pulmonary effort is normal. No respiratory distress.  Musculoskeletal:        General: Normal range of motion.     Cervical back: Normal range of motion.  Skin:    Coloration: Skin is not jaundiced or pale.  Neurological:     Mental Status: He is alert and oriented to person, place, and time.  Psychiatric:  Attention and Perception: Attention and perception normal.        Mood and Affect: Mood and affect normal.        Speech: Speech normal.        Behavior: Behavior normal. Behavior is cooperative.        Thought Content: Thought content normal.        Cognition and Memory: Cognition normal.        Judgment: Judgment is impulsive.   Review of Systems  Constitutional: Negative.   HENT: Negative.    Eyes: Negative.   Respiratory: Negative.    Cardiovascular: Negative.   Musculoskeletal: Negative.   Skin: Negative.   Neurological: Negative.   Psychiatric/Behavioral: Negative.    Blood pressure 105/69, pulse 67, temperature 97.7 F (36.5 C), temperature source Oral, resp. rate 18, SpO2 98 %. There is no height or weight on file to calculate BMI.  Demographic Factors:  Male, Caucasian, Low socioeconomic status, Living alone, and Unemployed  Loss Factors: Financial problems/change in socioeconomic status  Historical Factors: Impulsivity  Risk Reduction Factors:   Sense of responsibility to family, Religious beliefs about death, Positive social support, Positive therapeutic relationship, and Positive coping skills or problem solving skills  Continued Clinical Symptoms:  Severe Anxiety and/or Agitation Depression:   Comorbid alcohol abuse/dependence Impulsivity Alcohol/Substance Abuse/Dependencies  Cognitive Features That Contribute To Risk:  None    Suicide Risk:  Minimal: No  identifiable suicidal ideation.  Patients presenting with no risk factors but with morbid ruminations; may be classified as minimal risk based on the severity of the depressive symptoms  Plan Of Care/Follow-up recommendations:  Activity:  as tolerated  Diet:  regular   Disposition:   Discharge patient   3-day sample for Biktarvy provided.  Patient will follow-up with infectious disease Dr. Algis Liming  Outpatient psychiatric resources provided for [facility] behavioral health outpatient services on second floor including open access walk-in hours.  Provided IRC, mobile crisis unit number, and [place] and wellness for primary care needs.   Patient declines resources for substance abuse treatment.  No evidence of imminent risk to self or others at present.    Patient does not meet criteria for psychiatric inpatient admission. Discussed crisis plan, support from social network, calling 911, coming to the Emergency Department, and calling Suicide Hotline.   Ardis Hughs, NP 08/30/2021, 12:12 PM

## 2021-08-30 NOTE — Discharge Instructions (Signed)

## 2021-08-30 NOTE — ED Notes (Signed)
Pt came to nurse station c/o feeling anxious and shakey CIWA = 9 medicated per order vistaril for anxiety. Pt slept most of night undisturbed . AM vital signs WNL

## 2021-08-30 NOTE — ED Notes (Signed)
Pt eating lunch

## 2021-08-30 NOTE — Group Note (Signed)
Group Topic: Social Support  Group Date: 08/30/2021 Start Time: 1045 End Time: 1145 Facilitators: Loma Newton  Department: [facility]  Number of Participants: 5  Group Focus: other social support Treatment Modality:  Patient-Centered Therapy Interventions utilized were support Purpose: Identify and build support system  Name: Gary [name] Date of Birth: Feb 27, 1986  MR: [id number]    Level of Participation: active Quality of Participation: cooperative Interactions with others: gave feedback Mood/Affect: positive Triggers (if applicable): n/a Cognition: coherent/clear Progress: Moderate Response: n/a Plan: referral / recommendations  Patients Problems:  Patient Active Problem List   Diagnosis Date Noted   Constipation 07/30/2021   Alcohol-induced insomnia (HCC)    MDD (major depressive disorder), recurrent episode, 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/26/2021   Neutropenia (HCC) 07/26/2021   Alcohol withdrawal seizure without complication (HCC)    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   Elevated troponin 10/19/2020   Alcohol withdrawal (HCC) 10/19/2020   Alcohol abuse    Suicidal ideation 06/14/2020   Pancreatitis 06/13/2020   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   Malnutrition of moderate degree 05/07/2020   Gastrointestinal hemorrhage    Alcoholic hepatitis without ascites    Melena 05/05/2020   Alcoholic ketoacidosis 05/05/2020   Thrombocytopenia (HCC) 07/25/2019   Transaminitis 07/25/2019   Traumatic subdural hematoma 07/02/2019   Alcohol abuse with intoxication (HCC) 07/02/2019   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

## 2021-09-05 ENCOUNTER — Other Ambulatory Visit: Payer: Self-pay

## 2021-09-05 ENCOUNTER — Emergency Department (HOSPITAL_COMMUNITY)
Admission: EM | Admit: 2021-09-05 | Discharge: 2021-09-06 | Disposition: A | Discharge status: Home / Self Care | Payer: Self-pay | Attending: Emergency Medicine | Admitting: Emergency Medicine

## 2021-09-05 ENCOUNTER — Encounter (HOSPITAL_COMMUNITY): Payer: Self-pay

## 2021-09-05 DIAGNOSIS — F10921 Alcohol use, unspecified with intoxication delirium: Secondary | ICD-10-CM

## 2021-09-05 DIAGNOSIS — F17210 Nicotine dependence, cigarettes, uncomplicated: Secondary | ICD-10-CM | POA: Insufficient documentation

## 2021-09-05 DIAGNOSIS — Y908 Blood alcohol level of 240 mg/100 ml or more: Secondary | ICD-10-CM | POA: Insufficient documentation

## 2021-09-05 DIAGNOSIS — F10121 Alcohol abuse with intoxication delirium: Secondary | ICD-10-CM | POA: Insufficient documentation

## 2021-09-05 DIAGNOSIS — Z21 Asymptomatic human immunodeficiency virus [HIV] infection status: Secondary | ICD-10-CM | POA: Insufficient documentation

## 2021-09-05 DIAGNOSIS — R45850 Homicidal ideations: Secondary | ICD-10-CM | POA: Insufficient documentation

## 2021-09-05 DIAGNOSIS — R451 Restlessness and agitation: Secondary | ICD-10-CM

## 2021-09-05 LAB — COMPREHENSIVE METABOLIC PANEL
ALT: 48 U/L — ABNORMAL HIGH (ref 0–44)
AST: 54 U/L — ABNORMAL HIGH (ref 15–41)
Albumin: 4.8 g/dL (ref 3.5–5.0)
Alkaline Phosphatase: 47 U/L (ref 38–126)
Anion gap: 10 (ref 5–15)
BUN: 6 mg/dL (ref 6–20)
CO2: 28 mmol/L (ref 22–32)
Calcium: 9.1 mg/dL (ref 8.9–10.3)
Chloride: 103 mmol/L (ref 98–111)
Creatinine, Ser: 0.77 mg/dL (ref 0.61–1.24)
GFR, Estimated: >60 mL/min (ref >60)
Glucose, Bld: 88 mg/dL (ref 70–99)
Potassium: 3.8 mmol/L (ref 3.5–5.1)
Sodium: 141 mmol/L (ref 135–145)
Total Bilirubin: 0.5 mg/dL (ref 0.3–1.2)
Total Protein: 8.3 g/dL — ABNORMAL HIGH (ref 6.5–8.1)

## 2021-09-05 LAB — CBC WITH DIFFERENTIAL/PLATELET
Abs Immature Granulocytes: 0.01 10*3/uL (ref 0.00–0.07)
Basophils Absolute: 0.1 10*3/uL (ref 0.0–0.1)
Basophils Relative: 1 %
Eosinophils Absolute: 0.1 10*3/uL (ref 0.0–0.5)
Eosinophils Relative: 2 %
HCT: 50.6 % (ref 39.0–52.0)
Hemoglobin: 16.7 g/dL (ref 13.0–17.0)
Immature Granulocytes: 0 %
Lymphocytes Relative: 60 %
Lymphs Abs: 2.8 10*3/uL (ref 0.7–4.0)
MCH: 33.3 pg (ref 26.0–34.0)
MCHC: 33 g/dL (ref 30.0–36.0)
MCV: 100.8 fL — ABNORMAL HIGH (ref 80.0–100.0)
Monocytes Absolute: 0.3 10*3/uL (ref 0.1–1.0)
Monocytes Relative: 7 %
Neutro Abs: 1.4 10*3/uL — ABNORMAL LOW (ref 1.7–7.7)
Neutrophils Relative %: 30 %
Platelets: 376 10*3/uL (ref 150–400)
RBC: 5.02 MIL/uL (ref 4.22–5.81)
RDW: 15.6 % — ABNORMAL HIGH (ref 11.5–15.5)
WBC: 4.7 10*3/uL (ref 4.0–10.5)
nRBC: 0 % (ref 0.0–0.2)

## 2021-09-05 LAB — URINALYSIS, ROUTINE W REFLEX MICROSCOPIC
Bilirubin Urine: NEGATIVE
Glucose, UA: NEGATIVE mg/dL
Hgb urine dipstick: NEGATIVE
Ketones, ur: NEGATIVE mg/dL
Leukocytes,Ua: NEGATIVE
Nitrite: NEGATIVE
Protein, ur: NEGATIVE mg/dL
Specific Gravity, Urine: 1.003 — ABNORMAL LOW (ref 1.005–1.030)
pH: 6 (ref 5.0–8.0)

## 2021-09-05 LAB — SALICYLATE LEVEL: Salicylate Lvl: <7 mg/dL — ABNORMAL LOW (ref 7.0–30.0)

## 2021-09-05 LAB — ACETAMINOPHEN LEVEL: Acetaminophen (Tylenol), Serum: <10 ug/mL — ABNORMAL LOW (ref 10–30)

## 2021-09-05 LAB — ETHANOL: Alcohol, Ethyl (B): 428 mg/dL (ref <10)

## 2021-09-05 MED ORDER — HALOPERIDOL 5 MG PO TABS: 5.0000 mg | ORAL_TABLET | Freq: Four times a day (QID) | ORAL | Status: DC | PRN

## 2021-09-05 MED ORDER — BICTEGRAVIR-EMTRICITAB-TENOFOV 50-200-25 MG PO TABS: 1.0000 | ORAL_TABLET | Freq: Every day | ORAL | Status: DC

## 2021-09-05 MED ORDER — BENZTROPINE MESYLATE 1 MG PO TABS: 1.0000 mg | ORAL_TABLET | Freq: Four times a day (QID) | ORAL | Status: DC | PRN

## 2021-09-05 NOTE — ED Notes (Signed)
Pt being very aggressive towards staff, security to triage.

## 2021-09-05 NOTE — ED Notes (Signed)
Pt ambulatory to restroom with standby assist
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
## 2021-09-06 ENCOUNTER — Emergency Department (HOSPITAL_COMMUNITY): Payer: Self-pay

## 2021-09-06 ENCOUNTER — Encounter (HOSPITAL_COMMUNITY): Payer: Self-pay | Admitting: Emergency Medicine

## 2021-09-06 ENCOUNTER — Emergency Department (HOSPITAL_COMMUNITY)
Admission: EM | Admit: 2021-09-06 | Discharge: 2021-09-06 | Disposition: A | Discharge status: Home / Self Care | Payer: Self-pay | Attending: Emergency Medicine | Admitting: Emergency Medicine

## 2021-09-06 DIAGNOSIS — F17210 Nicotine dependence, cigarettes, uncomplicated: Secondary | ICD-10-CM | POA: Insufficient documentation

## 2021-09-06 DIAGNOSIS — F10120 Alcohol abuse with intoxication, uncomplicated: Secondary | ICD-10-CM | POA: Insufficient documentation

## 2021-09-06 DIAGNOSIS — S0101XA Laceration without foreign body of scalp, initial encounter: Secondary | ICD-10-CM | POA: Insufficient documentation

## 2021-09-06 DIAGNOSIS — Z21 Asymptomatic human immunodeficiency virus [HIV] infection status: Secondary | ICD-10-CM | POA: Insufficient documentation

## 2021-09-06 DIAGNOSIS — X58XXXA Exposure to other specified factors, initial encounter: Secondary | ICD-10-CM | POA: Insufficient documentation

## 2021-09-06 DIAGNOSIS — F1010 Alcohol abuse, uncomplicated: Secondary | ICD-10-CM

## 2021-09-06 LAB — RAPID URINE DRUG SCREEN, HOSP PERFORMED
Amphetamines: NOT DETECTED
Barbiturates: NOT DETECTED
Benzodiazepines: NOT DETECTED
Cocaine: NOT DETECTED
Opiates: NOT DETECTED
Tetrahydrocannabinol: NOT DETECTED

## 2021-09-06 IMAGING — CT CT HEAD W/O CM
4 series · 16 of 47 positions shown, 18 images · non-contrast
Comparison: [DATE]

CLINICAL DATA: Found on the street intoxicated

EXAM:
CT HEAD WITHOUT CONTRAST
TECHNIQUE: Contiguous axial images were obtained from the base of the skull
through the vertex without intravenous contrast.

[Series 2: head wo · axial · 0.47mm/px · z∈[-181,-51]mm · 7 of 36 slices shown, 9 images]
[im 5/36  brain]
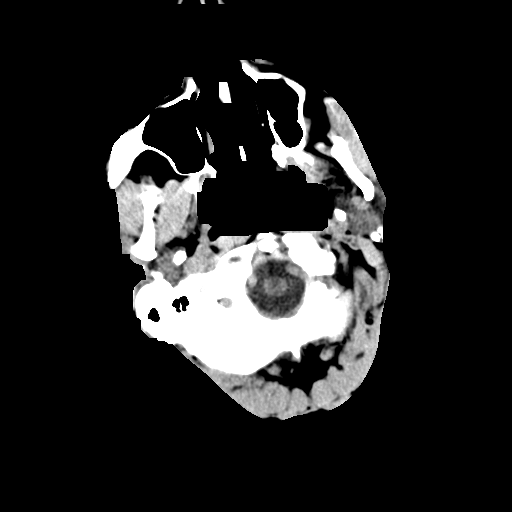
[im 5/36  bone]
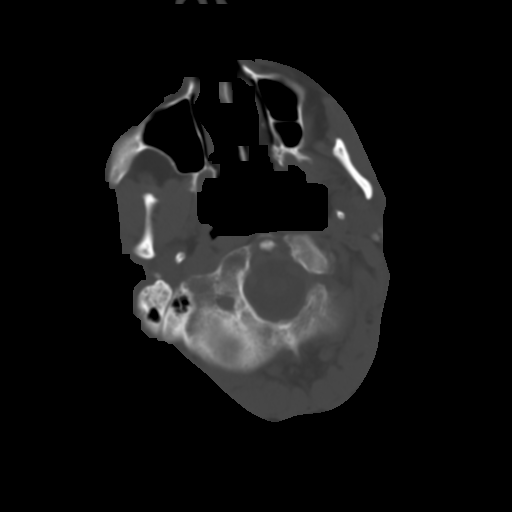
[im 9/36  brain]
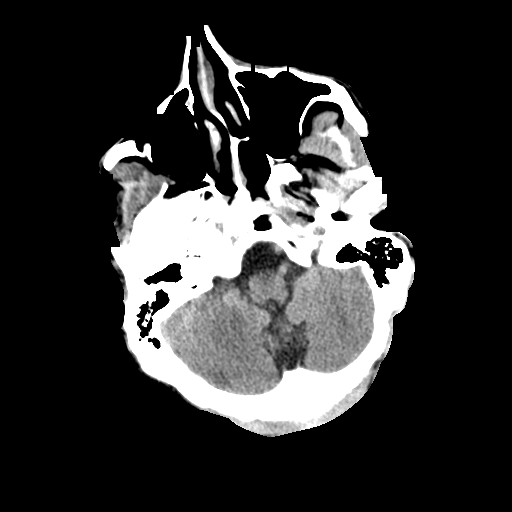
[im 14/36  brain]
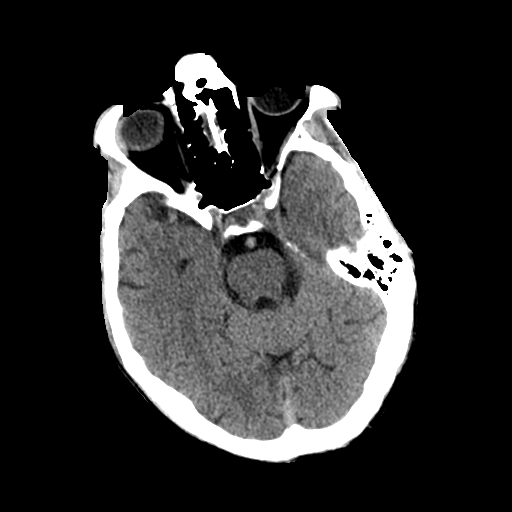
[im 18/36  brain]
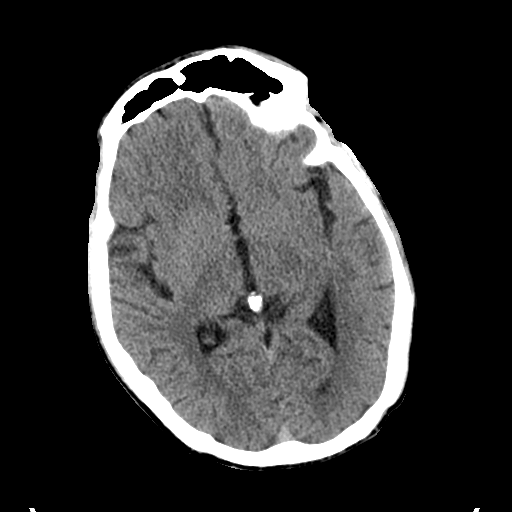
[im 22/36  brain]
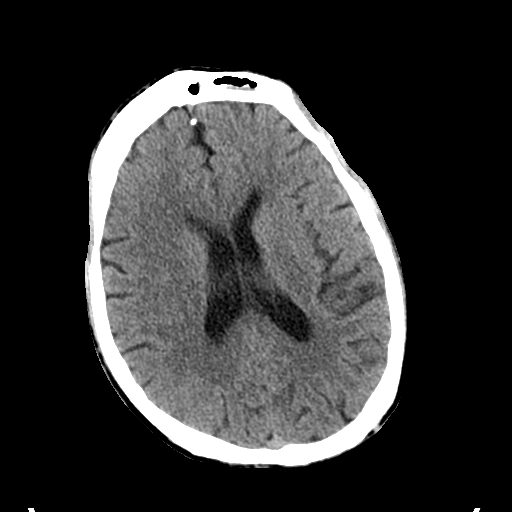
[im 22/36  bone]
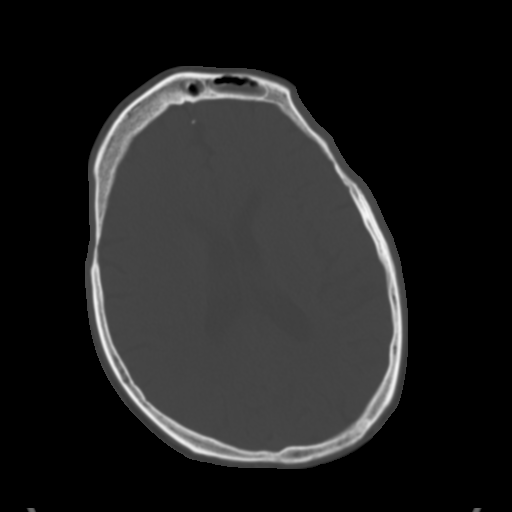
[im 27/36  brain]
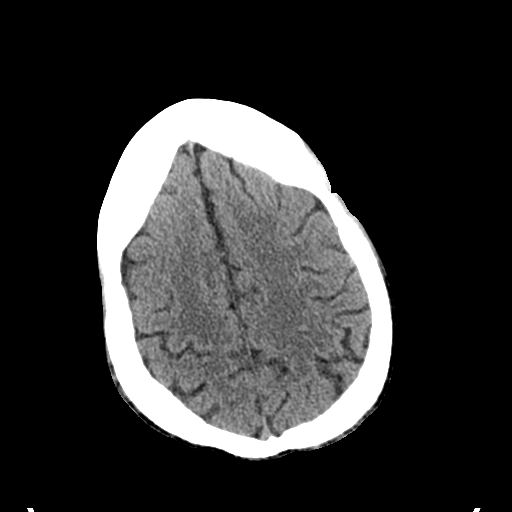
[im 31/36  brain]
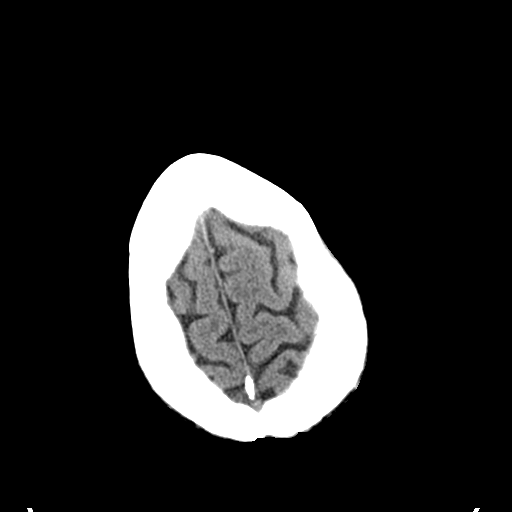

[Series 3: head bone · axial · 0.47mm/px · z∈[-185,-149]mm · 3 of 90 slices shown]
[im 9/90  bone]
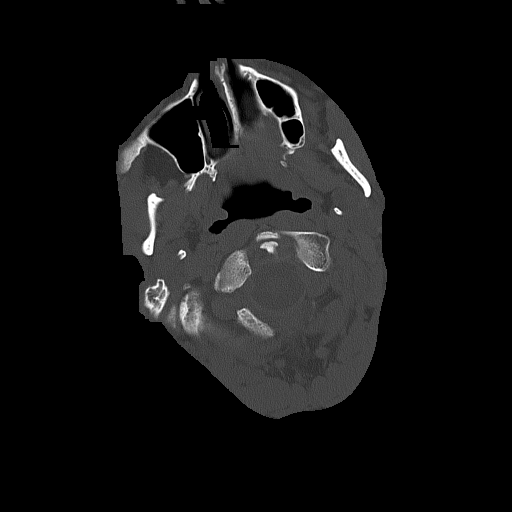
[im 18/90  bone]
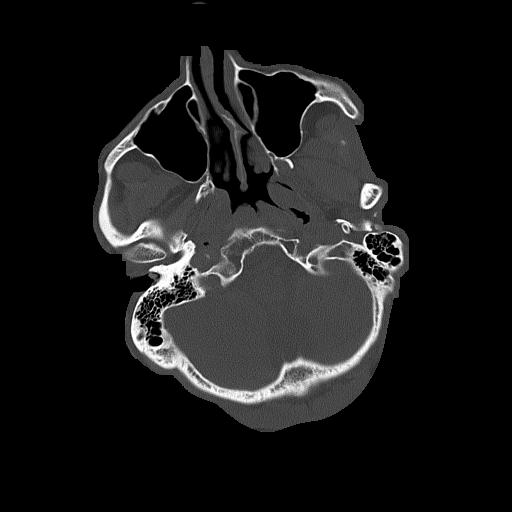
[im 27/90  bone]
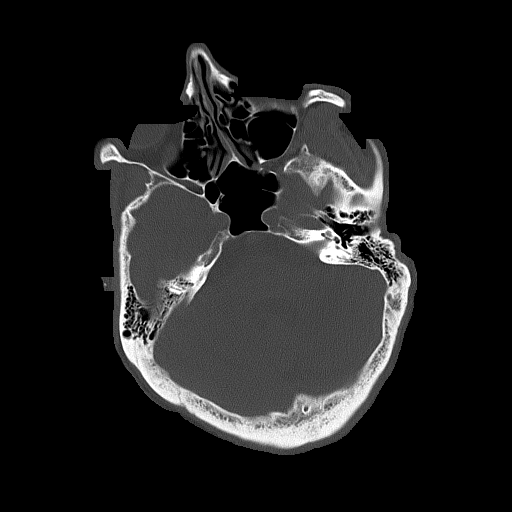

[Series 5: coronal soft tissue · coronal · 0.37mm/px · 3 of 84 slices shown]
[im 28/84  brain]
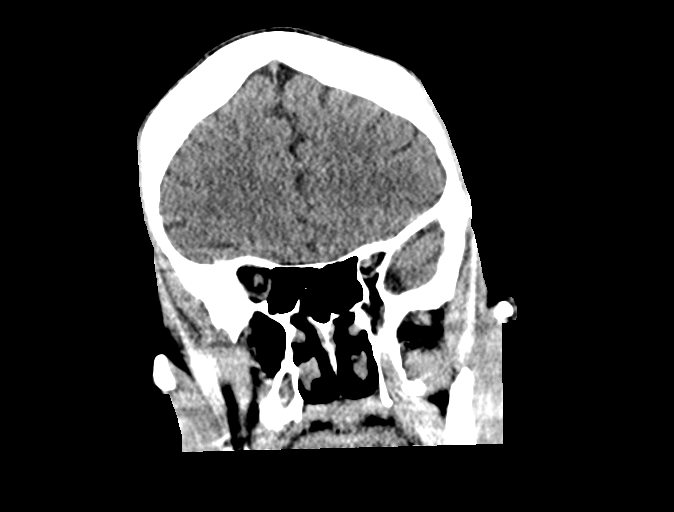
[im 37/84  brain]
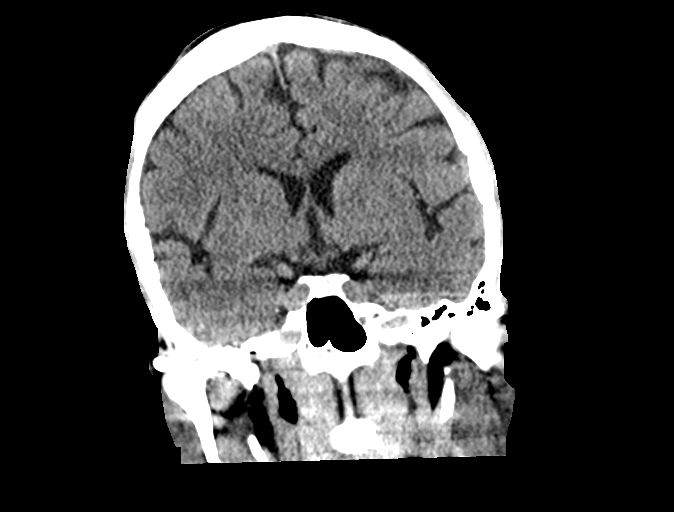
[im 47/84  brain]
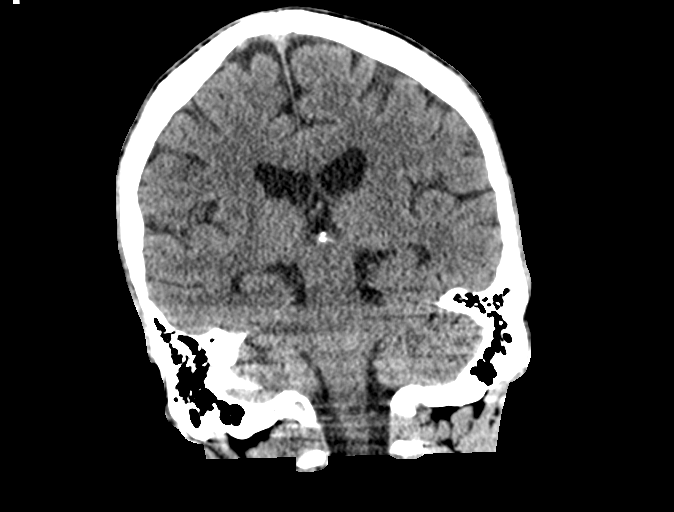

[Series 6: sagittal soft tissue · sagittal · 0.40mm/px · 3 of 58 slices shown]
[im 20/58  brain]
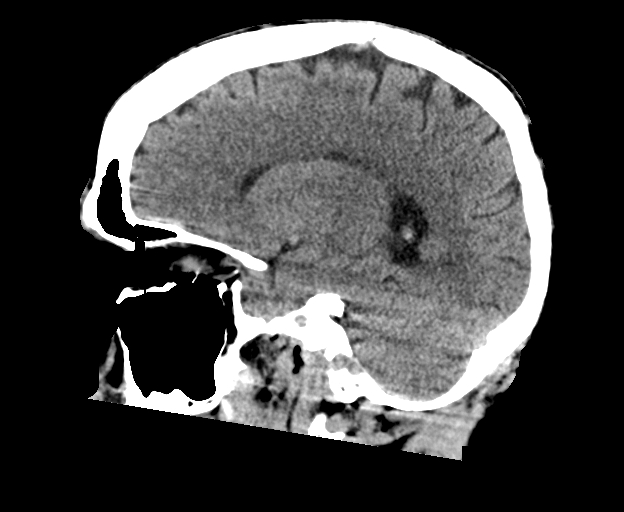
[im 29/58  brain]
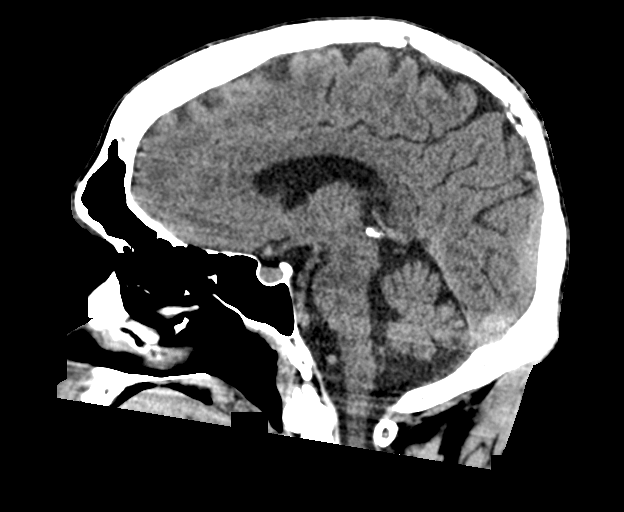
[im 39/58  brain]
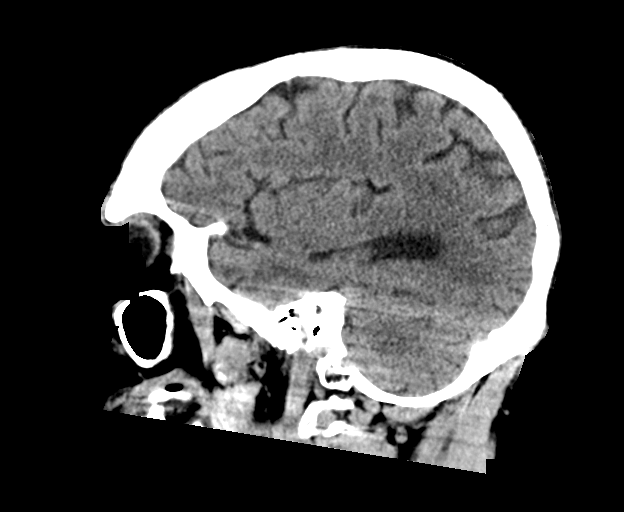

[16 of 47 positions shown; findings below may reference images not displayed]

FINDINGS: Brain: No evidence of acute infarction, hemorrhage, hydrocephalus,
extra-axial collection or mass lesion/mass effect.

Vascular: No hyperdense vessel or unexpected calcification.

Skull: No osseous abnormality.

Sinuses/Orbits: Mild mucosal thickening in the left frontal sinus.
Visualized mastoid sinuses are clear. Visualized orbits demonstrate
no focal abnormality.

Other: None
IMPRESSION: No acute intracranial pathology.

## 2021-09-06 MED ORDER — LORAZEPAM 1 MG PO TABS
2.0000 mg | ORAL_TABLET | Freq: Once | ORAL | Status: AC
  Administered 2021-09-06: 2 mg via ORAL
  Filled 2021-09-06: qty 2

## 2021-09-06 MED ORDER — CHLORDIAZEPOXIDE HCL 25 MG PO CAPS
25.0000 mg | ORAL_CAPSULE | Freq: Once | ORAL | Status: AC
  Administered 2021-09-06: 25 mg via ORAL
  Filled 2021-09-06: qty 1

## 2021-09-06 MED ORDER — CHLORDIAZEPOXIDE HCL 25 MG PO CAPS: ORAL_CAPSULE | ORAL | 0 refills | Status: DC

## 2021-09-06 MED ORDER — PROMETHAZINE HCL 25 MG PO TABS
25.0000 mg | ORAL_TABLET | Freq: Once | ORAL | Status: AC
  Administered 2021-09-06: 25 mg via ORAL
  Filled 2021-09-06: qty 1

## 2021-09-06 NOTE — Discharge Instructions (Addendum)
Remove staples in 7 days.  You have been prescribed a librium taper to help with alcohol withdrawal. Please take as prescribed.

## 2021-09-06 NOTE — ED Triage Notes (Signed)
Patient presents via police for medical clearance. After the patient was discharged today, he was found on the streets intoxicated.
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
## 2021-09-22 ENCOUNTER — Other Ambulatory Visit: Payer: Self-pay

## 2021-09-22 ENCOUNTER — Emergency Department (HOSPITAL_COMMUNITY)
Admission: EM | Admit: 2021-09-22 | Discharge: 2021-09-23 | Disposition: A | Discharge status: Home / Self Care | Payer: Self-pay | Attending: Emergency Medicine | Admitting: Emergency Medicine

## 2021-09-22 ENCOUNTER — Emergency Department (HOSPITAL_COMMUNITY): Payer: Self-pay

## 2021-09-22 ENCOUNTER — Encounter (HOSPITAL_COMMUNITY): Payer: Self-pay

## 2021-09-22 DIAGNOSIS — Z21 Asymptomatic human immunodeficiency virus [HIV] infection status: Secondary | ICD-10-CM | POA: Insufficient documentation

## 2021-09-22 DIAGNOSIS — F333 Major depressive disorder, recurrent, severe with psychotic symptoms: Secondary | ICD-10-CM | POA: Insufficient documentation

## 2021-09-22 DIAGNOSIS — F17210 Nicotine dependence, cigarettes, uncomplicated: Secondary | ICD-10-CM | POA: Insufficient documentation

## 2021-09-22 DIAGNOSIS — F10120 Alcohol abuse with intoxication, uncomplicated: Secondary | ICD-10-CM | POA: Insufficient documentation

## 2021-09-22 DIAGNOSIS — F10920 Alcohol use, unspecified with intoxication, uncomplicated: Secondary | ICD-10-CM

## 2021-09-22 DIAGNOSIS — G40909 Epilepsy, unspecified, not intractable, without status epilepticus: Secondary | ICD-10-CM | POA: Insufficient documentation

## 2021-09-22 DIAGNOSIS — Y908 Blood alcohol level of 240 mg/100 ml or more: Secondary | ICD-10-CM | POA: Insufficient documentation

## 2021-09-22 DIAGNOSIS — R569 Unspecified convulsions: Secondary | ICD-10-CM

## 2021-09-22 DIAGNOSIS — R4182 Altered mental status, unspecified: Secondary | ICD-10-CM | POA: Insufficient documentation

## 2021-09-22 DIAGNOSIS — Z5900 Homelessness unspecified: Secondary | ICD-10-CM | POA: Insufficient documentation

## 2021-09-22 DIAGNOSIS — F1924 Other psychoactive substance dependence with psychoactive substance-induced mood disorder: Secondary | ICD-10-CM | POA: Insufficient documentation

## 2021-09-22 DIAGNOSIS — Z20822 Contact with and (suspected) exposure to covid-19: Secondary | ICD-10-CM | POA: Insufficient documentation

## 2021-09-22 DIAGNOSIS — R45851 Suicidal ideations: Secondary | ICD-10-CM | POA: Insufficient documentation

## 2021-09-22 LAB — CBC WITH DIFFERENTIAL/PLATELET
Abs Immature Granulocytes: 0 10*3/uL (ref 0.00–0.07)
Basophils Absolute: 0 10*3/uL (ref 0.0–0.1)
Basophils Relative: 1 %
Eosinophils Absolute: 0.1 10*3/uL (ref 0.0–0.5)
Eosinophils Relative: 3 %
HCT: 42.2 % (ref 39.0–52.0)
Hemoglobin: 14.6 g/dL (ref 13.0–17.0)
Immature Granulocytes: 0 %
Lymphocytes Relative: 42 %
Lymphs Abs: 1.5 10*3/uL (ref 0.7–4.0)
MCH: 33.2 pg (ref 26.0–34.0)
MCHC: 34.6 g/dL (ref 30.0–36.0)
MCV: 95.9 fL (ref 80.0–100.0)
Monocytes Absolute: 0.1 10*3/uL (ref 0.1–1.0)
Monocytes Relative: 3 %
Neutro Abs: 1.8 10*3/uL (ref 1.7–7.7)
Neutrophils Relative %: 51 %
Platelets: 177 10*3/uL (ref 150–400)
RBC: 4.4 MIL/uL (ref 4.22–5.81)
RDW: 14.4 % (ref 11.5–15.5)
WBC: 3.5 10*3/uL — ABNORMAL LOW (ref 4.0–10.5)
nRBC: 0 % (ref 0.0–0.2)

## 2021-09-22 LAB — COMPREHENSIVE METABOLIC PANEL
ALT: 35 U/L (ref 0–44)
AST: 52 U/L — ABNORMAL HIGH (ref 15–41)
Albumin: 4.1 g/dL (ref 3.5–5.0)
Alkaline Phosphatase: 56 U/L (ref 38–126)
Anion gap: 10 (ref 5–15)
BUN: 8 mg/dL (ref 6–20)
CO2: 24 mmol/L (ref 22–32)
Calcium: 8.5 mg/dL — ABNORMAL LOW (ref 8.9–10.3)
Chloride: 106 mmol/L (ref 98–111)
Creatinine, Ser: 0.47 mg/dL — ABNORMAL LOW (ref 0.61–1.24)
GFR, Estimated: >60 mL/min (ref >60)
Glucose, Bld: 91 mg/dL (ref 70–99)
Potassium: 3.6 mmol/L (ref 3.5–5.1)
Sodium: 140 mmol/L (ref 135–145)
Total Bilirubin: 0.4 mg/dL (ref 0.3–1.2)
Total Protein: 7.3 g/dL (ref 6.5–8.1)

## 2021-09-22 LAB — RESP PANEL BY RT-PCR (FLU A&B, COVID) ARPGX2
Influenza A by PCR: NEGATIVE
Influenza B by PCR: NEGATIVE
SARS Coronavirus 2 by RT PCR: NEGATIVE

## 2021-09-22 LAB — ETHANOL: Alcohol, Ethyl (B): 393 mg/dL (ref <10)

## 2021-09-22 IMAGING — CT CT CERVICAL SPINE W/O CM
3 of 4 series · 13 of 33 positions shown, 16 images · non-contrast
Comparison: Head CT [DATE].  Cervical spine CT [DATE].

CLINICAL DATA: Head trauma, fall.

EXAM:
CT HEAD WITHOUT CONTRAST
CT CERVICAL SPINE WITHOUT CONTRAST
TECHNIQUE: Multidetector CT imaging of the head and cervical spine was
performed following the standard protocol without intravenous
contrast. Multiplanar CT image reconstructions of the cervical spine
were also generated.

[Series 5: orthogonal bone · axial · 0.36mm/px · z∈[-290,-181]mm · 5 of 91 slices shown, 7 images]
[im 16/91  soft-tissue]
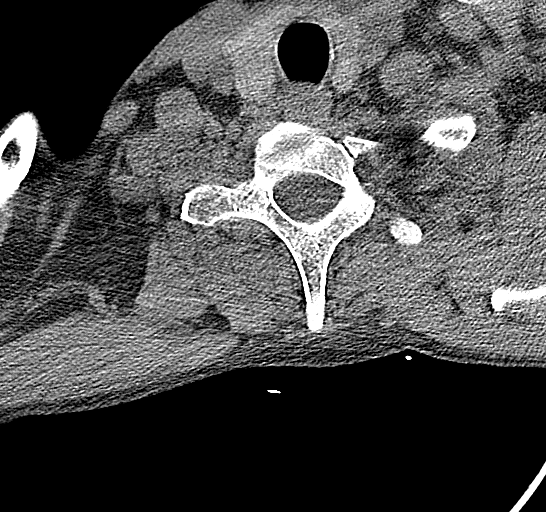
[im 16/91  bone]
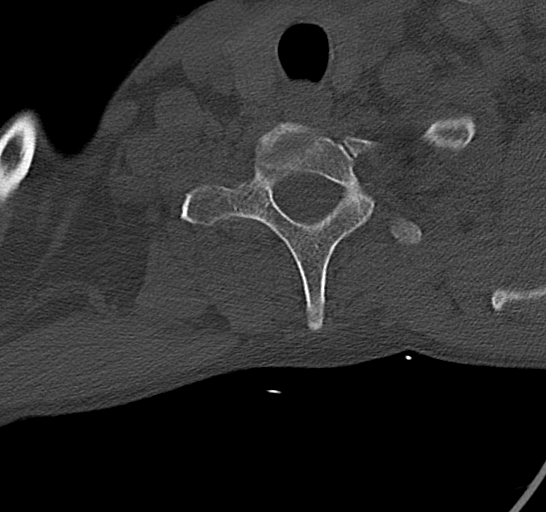
[im 31/91  bone]
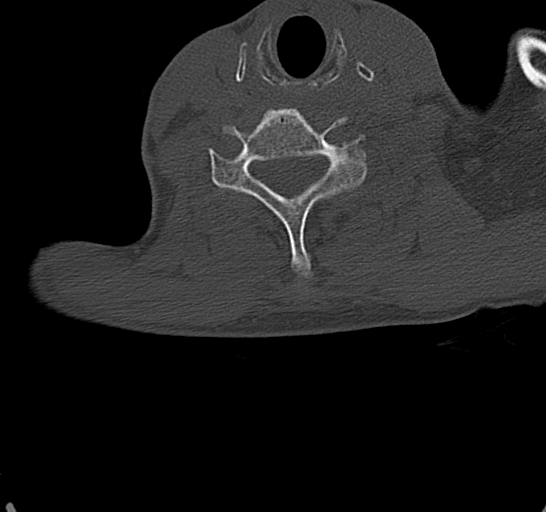
[im 46/91  bone]
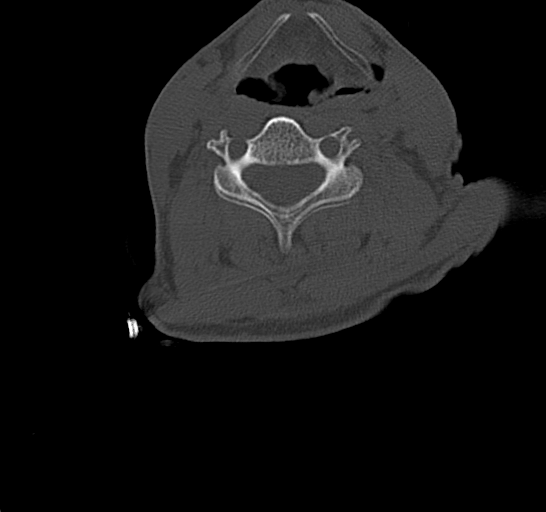
[im 61/91  bone]
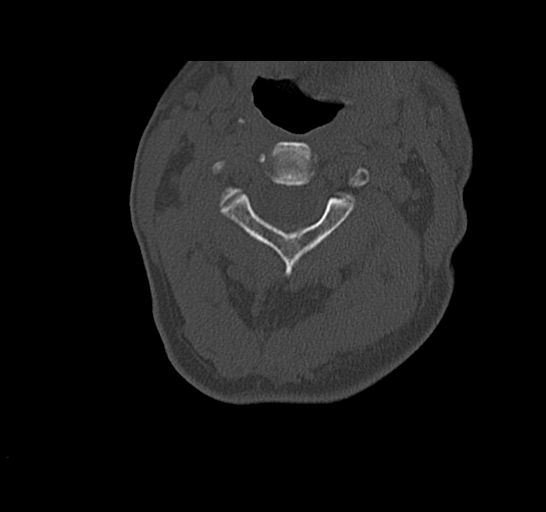
[im 76/91  soft-tissue]
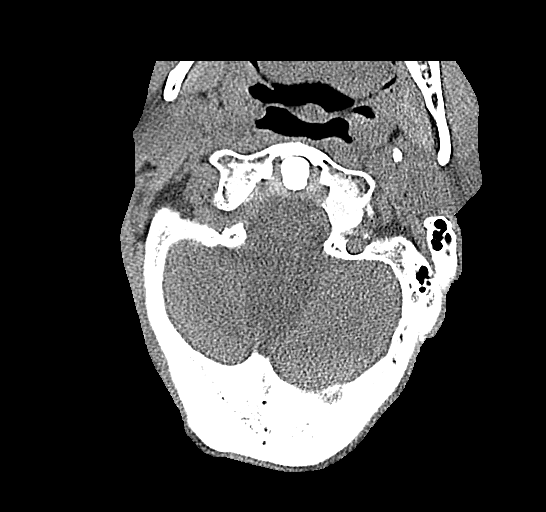
[im 76/91  bone]
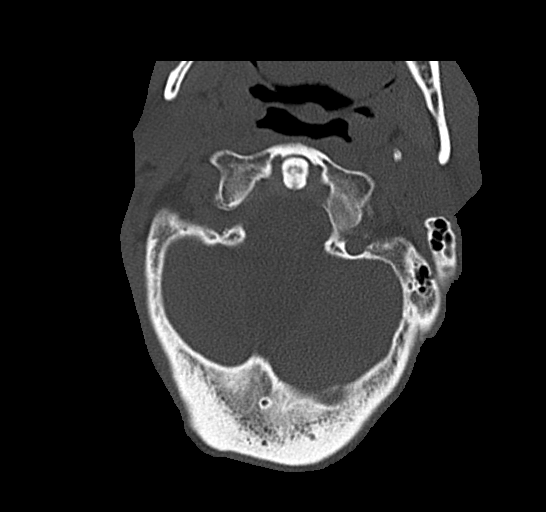

[Series 6: coronal bone · coronal · 0.32mm/px · 3 of 58 slices shown]
[im 13/58  bone]
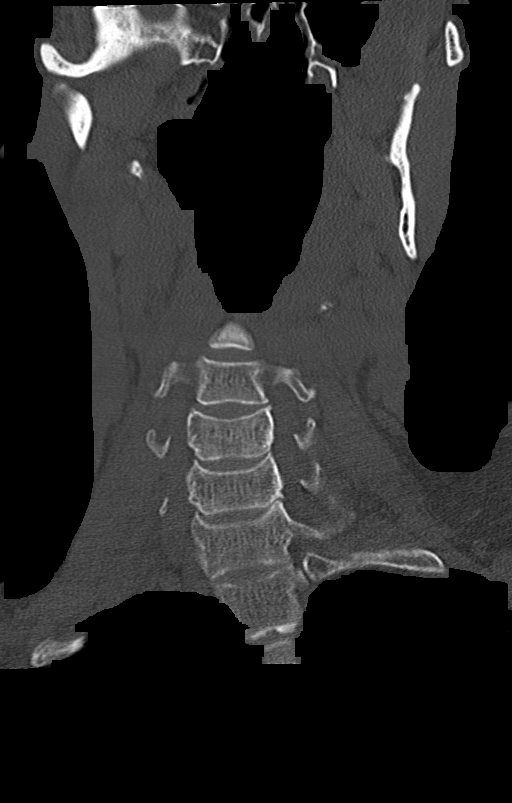
[im 24/58  bone]
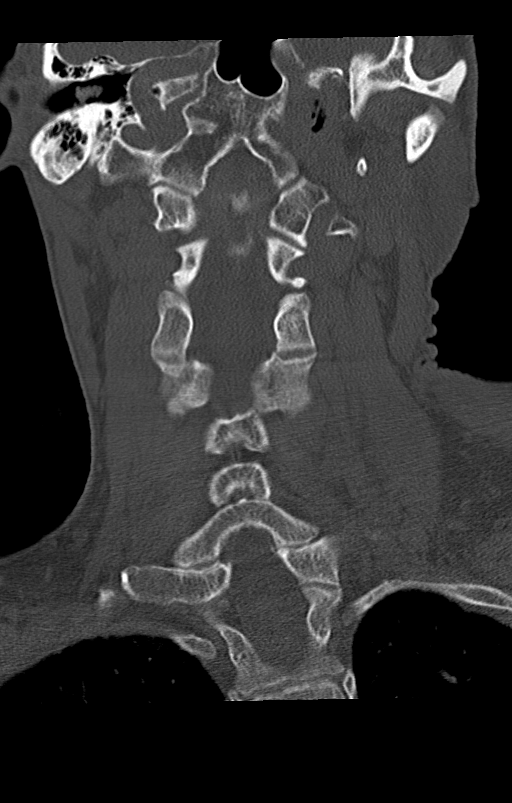
[im 34/58  bone]
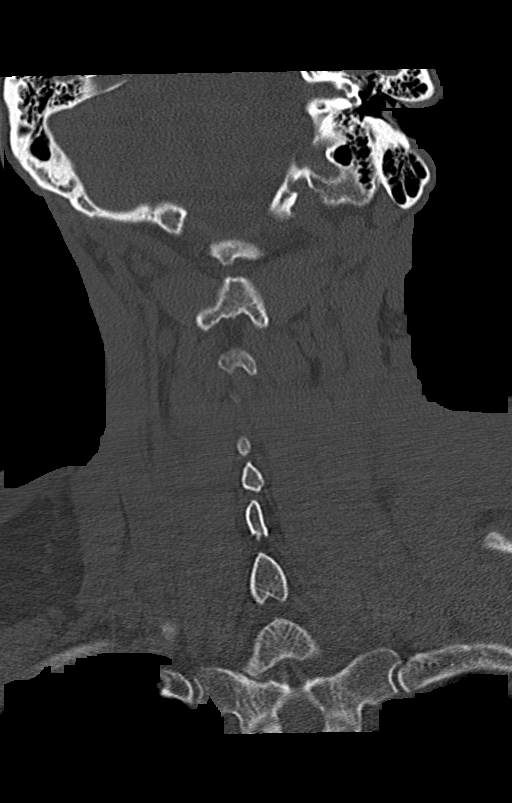

[Series 7: sagittal bone · sagittal · 0.39mm/px · 5 of 54 slices shown, 6 images]
[im 18/54  bone]
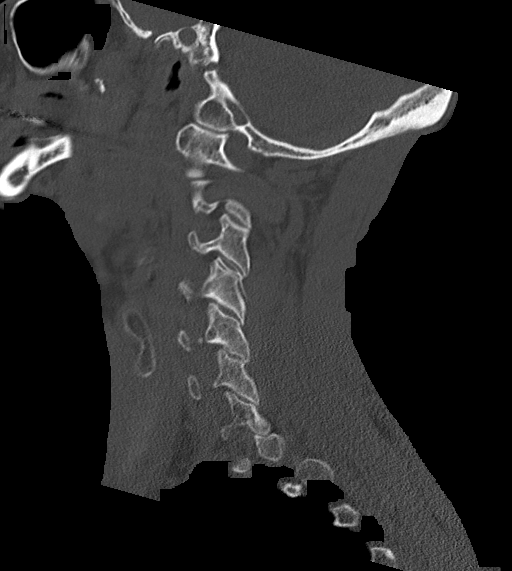
[im 23/54  bone]
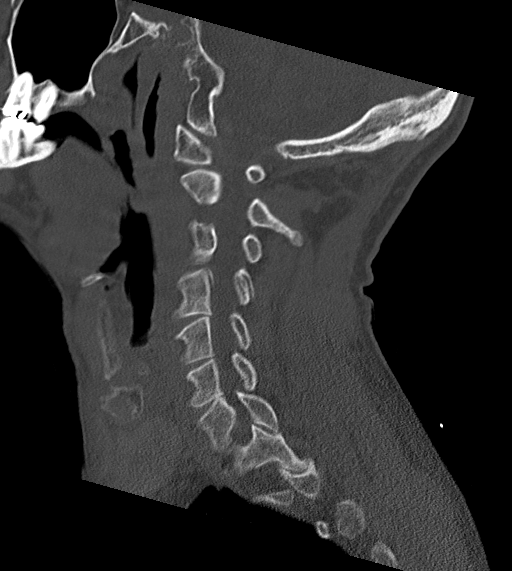
[im 27/54  soft-tissue]
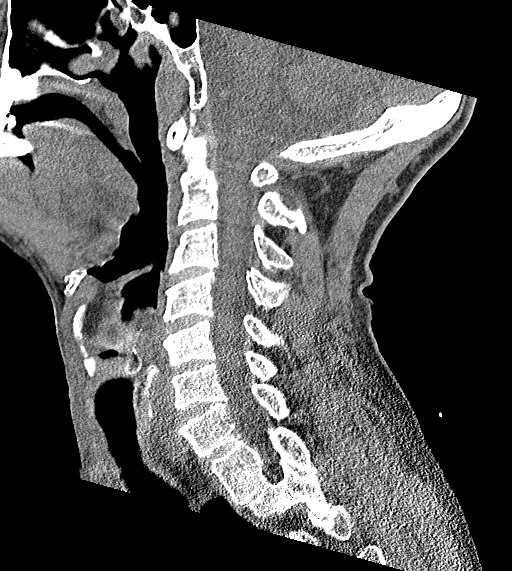
[im 27/54  bone]
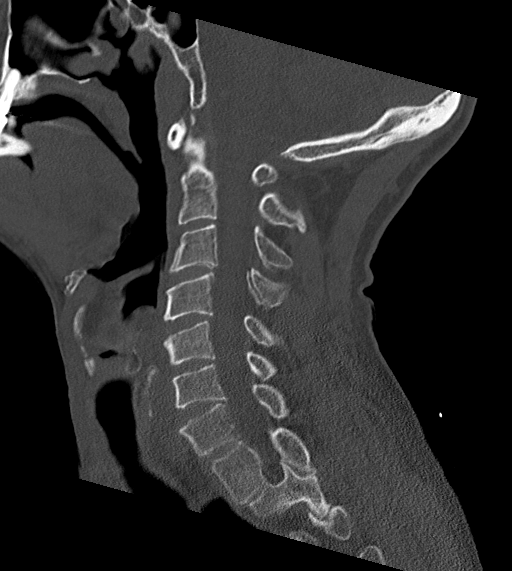
[im 31/54  bone]
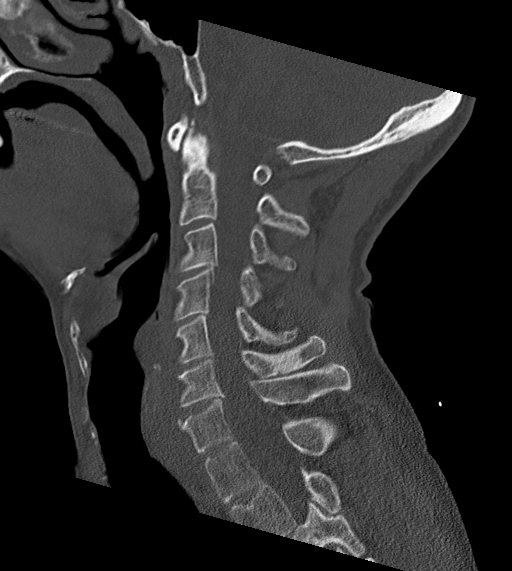
[im 36/54  bone]
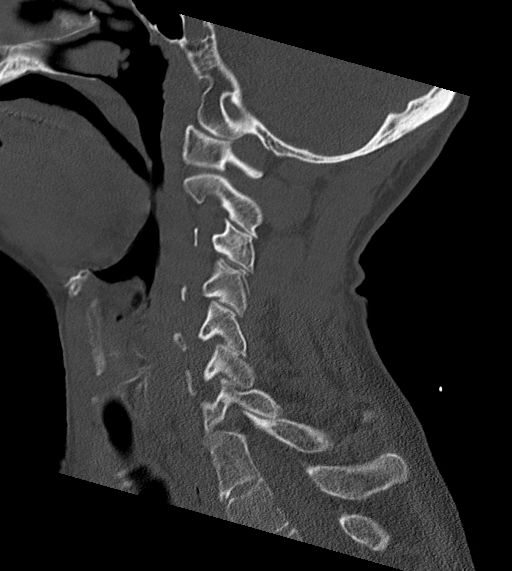

[13 of 33 positions shown; findings below may reference images not displayed]

FINDINGS: CT HEAD FINDINGS

Brain: No evidence of acute infarction, hemorrhage, hydrocephalus,
extra-axial collection or mass lesion/mass effect.

Vascular: No hyperdense vessel or unexpected calcification.

Skull: Normal. Negative for fracture or focal lesion.

Sinuses/Orbits: No acute finding.

Other: Right frontal skin staples are present.

CT CERVICAL SPINE FINDINGS

Alignment: Normal.

Skull base and vertebrae: No acute fracture. No primary bone lesion
or focal pathologic process.

Soft tissues and spinal canal: No prevertebral fluid or swelling. No
visible canal hematoma.

Disc levels: Mild degenerative disc space narrowing is seen
throughout the cervical spine similar to the prior study. No
significant central canal or neural foraminal stenosis at any level.

Upper chest: Negative.

Other: None.
IMPRESSION: No acute intracranial process.

No acute fracture or traumatic subluxation of the cervical spine.

## 2021-09-22 IMAGING — CT CT HEAD W/O CM
3 series · 14 of 47 positions shown, 16 images · non-contrast
Comparison: Head CT [DATE].  Cervical spine CT [DATE].

CLINICAL DATA: Head trauma, fall.

EXAM:
CT HEAD WITHOUT CONTRAST
CT CERVICAL SPINE WITHOUT CONTRAST
TECHNIQUE: Multidetector CT imaging of the head and cervical spine was
performed following the standard protocol without intravenous
contrast. Multiplanar CT image reconstructions of the cervical spine
were also generated.

[Series 4: head wo · axial · 0.49mm/px · z∈[-121,+14]mm · 8 of 33 slices shown, 10 images]
[im 3/33  brain]
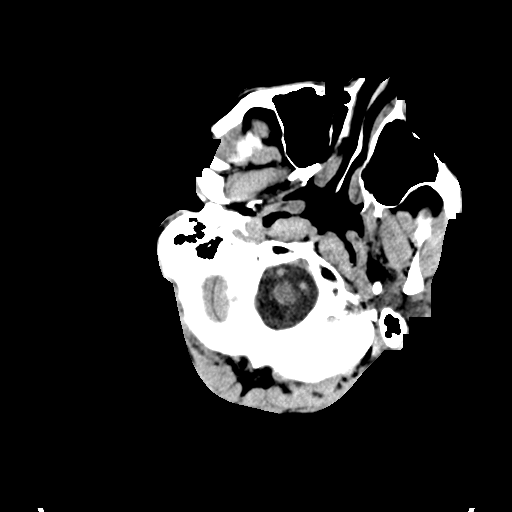
[im 3/33  bone]
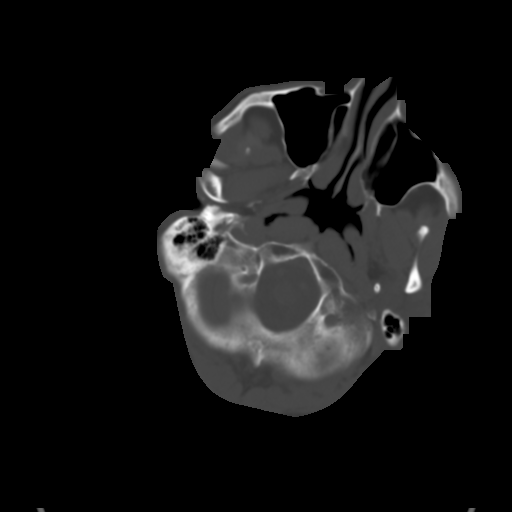
[im 7/33  brain]
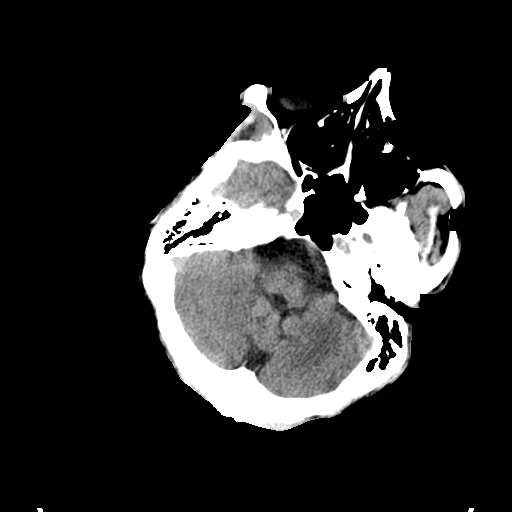
[im 10/33  brain]
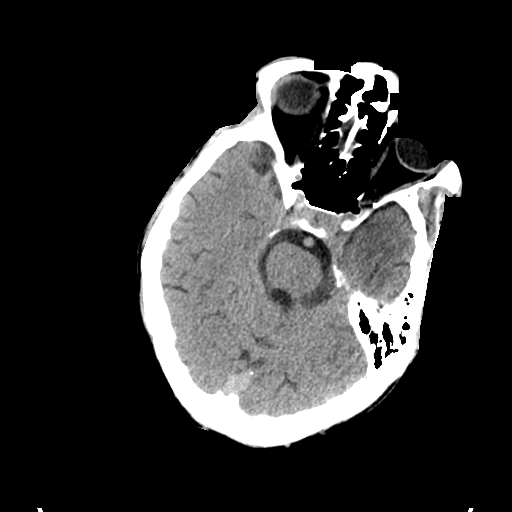
[im 15/33  brain]
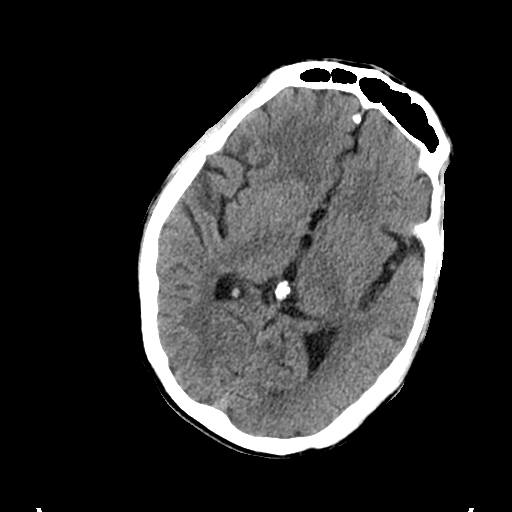
[im 18/33  brain]
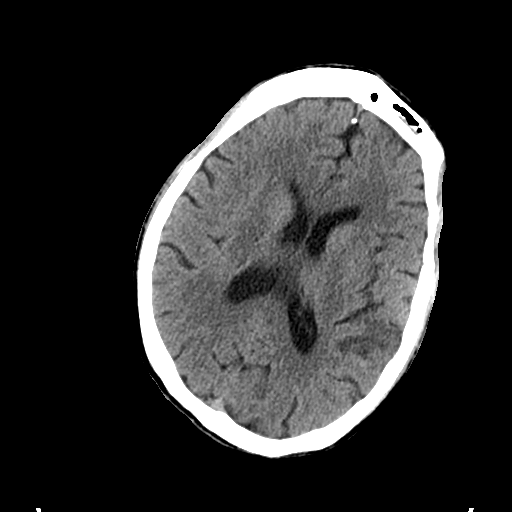
[im 18/33  bone]
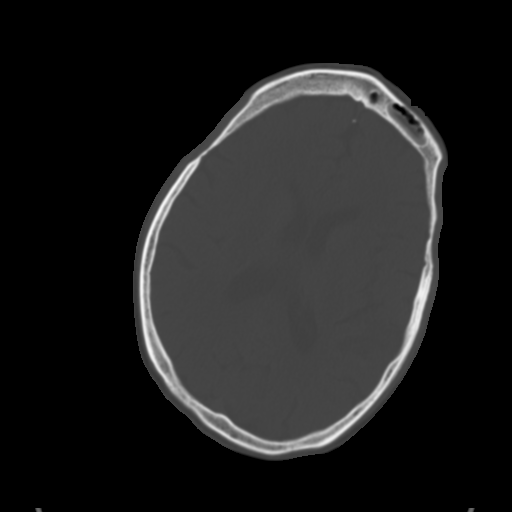
[im 23/33  brain]
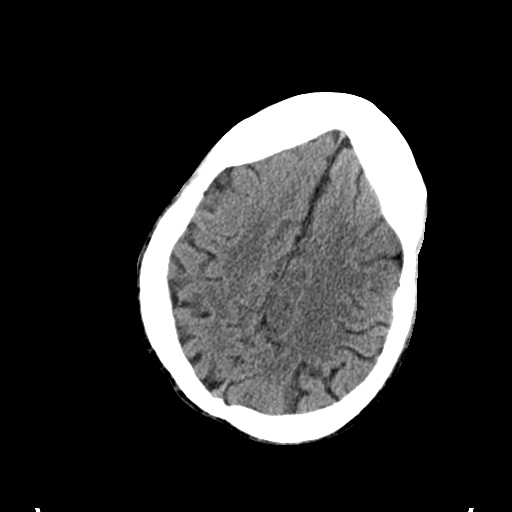
[im 26/33  brain]
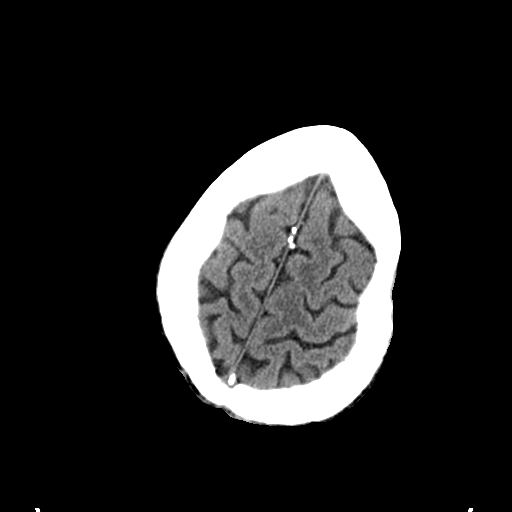
[im 30/33  brain]
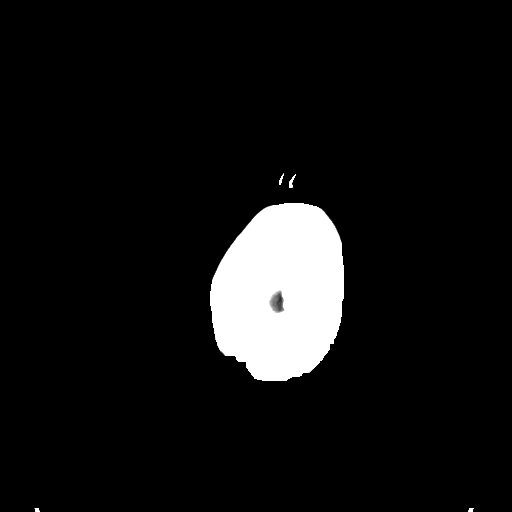

[Series 5: coronal soft tissue · coronal · 0.32mm/px · 3 of 71 slices shown]
[im 24/71  brain]
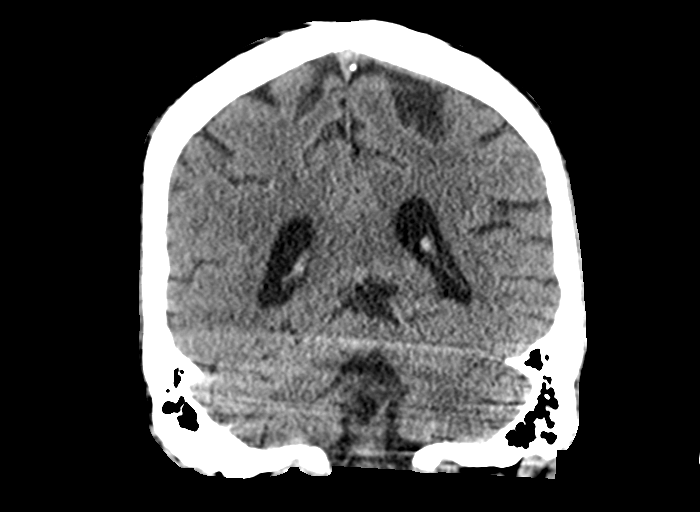
[im 32/71  brain]
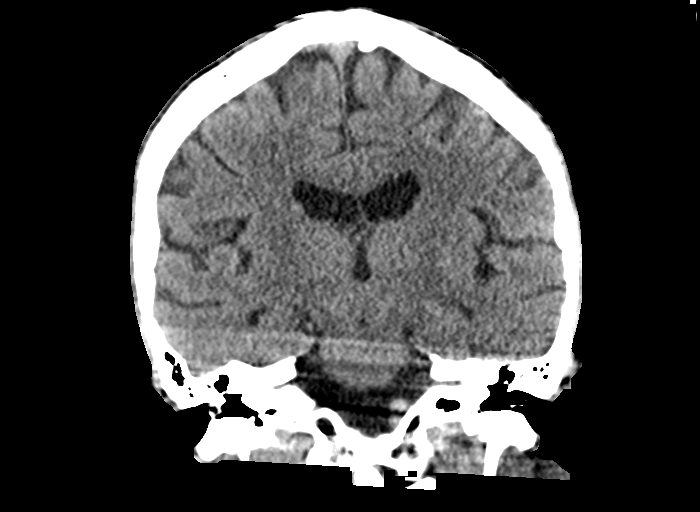
[im 39/71  brain]
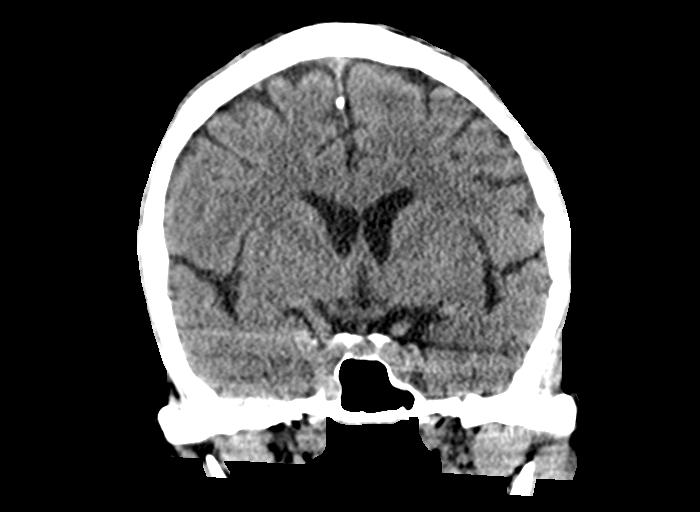

[Series 6: sagittal soft tissue · sagittal · 0.36mm/px · 3 of 53 slices shown]
[im 18/53  brain]
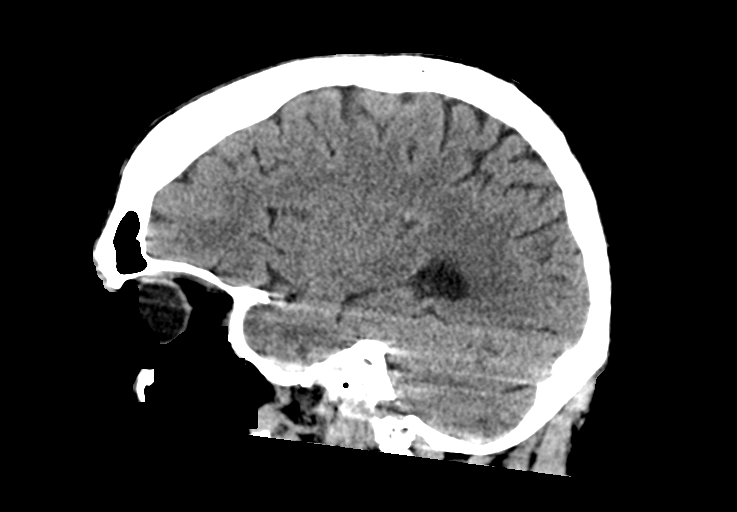
[im 27/53  brain]
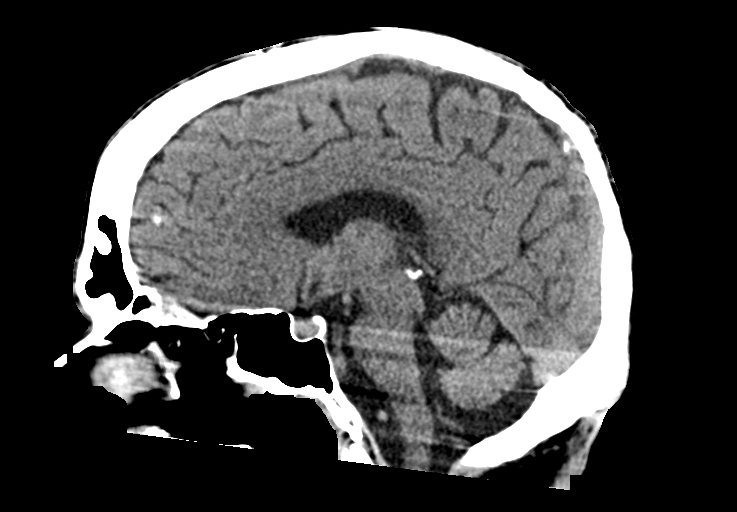
[im 35/53  brain]
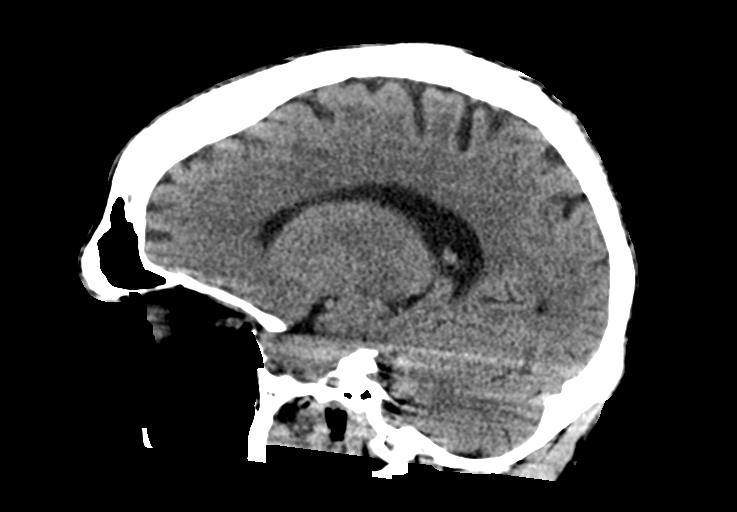

[14 of 47 positions shown; findings below may reference images not displayed]

FINDINGS: CT HEAD FINDINGS

Brain: No evidence of acute infarction, hemorrhage, hydrocephalus,
extra-axial collection or mass lesion/mass effect.

Vascular: No hyperdense vessel or unexpected calcification.

Skull: Normal. Negative for fracture or focal lesion.

Sinuses/Orbits: No acute finding.

Other: Right frontal skin staples are present.

CT CERVICAL SPINE FINDINGS

Alignment: Normal.

Skull base and vertebrae: No acute fracture. No primary bone lesion
or focal pathologic process.

Soft tissues and spinal canal: No prevertebral fluid or swelling. No
visible canal hematoma.

Disc levels: Mild degenerative disc space narrowing is seen
throughout the cervical spine similar to the prior study. No
significant central canal or neural foraminal stenosis at any level.

Upper chest: Negative.

Other: None.
IMPRESSION: No acute intracranial process.

No acute fracture or traumatic subluxation of the cervical spine.

## 2021-09-22 MED ORDER — LEVETIRACETAM IN NACL 1500 MG/100ML IV SOLN
1500.0000 mg | Freq: Once | INTRAVENOUS | Status: AC
  Administered 2021-09-22: 1500 mg via INTRAVENOUS
  Filled 2021-09-22: qty 100

## 2021-09-22 MED ORDER — LACTATED RINGERS IV BOLUS
1000.0000 mL | Freq: Once | INTRAVENOUS | Status: AC
  Administered 2021-09-22: 1000 mL via INTRAVENOUS

## 2021-09-22 NOTE — ED Provider Notes (Signed)
[facility]-EMERGENCY DEPT Provider Note   CSN: [id number] Arrival date & time: 09/22/21  1508     History Chief Complaint  Patient presents with   Alcohol Problem   Seizures    Gary [name] is a 35 y.o. male.  HPI     35 year old male with history of HIV last CD4 count 423 in May, seizures, alcohol abuse and dependence, major depressive disorder, subdural hematoma, polysubstance abuse, presents with concern for witnessed seizure.  History is limited on my evaluation given patient's altered mental status.  He is able to state his name, but does not able to provide other history and is too somnolent.  Chart review shows that he did have a fall laceration repair on October 29 and still has staples present in his scalp.  EMS had reported that patient was homeless, bystanders witnessed a seizure and that he has had a history of seizures with withdrawal from alcohol.  He states his last drink was about an hour or 2 ago.  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   Elevated troponin 10/19/2020   Alcohol withdrawal (HCC) 10/19/2020   Alcohol abuse    Suicidal ideation 06/14/2020   Pancreatitis 06/13/2020   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   Malnutrition of moderate degree 05/07/2020   Gastrointestinal hemorrhage    Alcoholic hepatitis without ascites    Melena 05/05/2020   Alcoholic ketoacidosis 05/05/2020   Thrombocytopenia (HCC) 07/25/2019   Transaminitis 07/25/2019   Traumatic subdural hematoma 07/02/2019    Alcohol abuse with intoxication (HCC) 07/02/2019   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    Past Surgical History:  Procedure Laterality Date   INCISION AND DRAINAGE PERIRECTAL ABSCESS N/A 09/24/2016   Procedure: IRRIGATION AND DEBRIDEMENT PERIRECTAL ABSCESS;  Surgeon: Ricarda Frame, MD;  Location: [facility] ORS;  Service: General;  Laterality: N/A;   none         Family History  Problem Relation Age of Onset   Alcohol abuse Mother    Alcohol abuse Father     Social History   Tobacco Use   Smoking status: Every Day    Packs/day: 0.50    Types: Cigarettes   Smokeless tobacco: Never   Tobacco comments:    unable to smoke while incarcerated 6+ months 02/13/20  Vaping Use   Vaping Use: Never used  Substance Use Topics   Alcohol use: Yes    Comment: "I drink all I can"   Drug use: Yes    Types: Methamphetamines, Marijuana    Comment: last used 04/10/2019    Home Medications Prior to Admission medications   Medication Sig Start Date End Date Taking? Authorizing Provider  bictegravir-emtricitabine-tenofovir AF (BIKTARVY) 50-200-25 MG TABS tablet Take 1 tablet by mouth daily. Patient not taking: Reported on 09/22/2021 08/31/21   Ardis Hughs, NP  chlordiazePOXIDE (LIBRIUM) 25 MG capsule 50mg  PO TID x 1D, then 25-50mg  PO BID X 1D, then 25-50mg  PO QD X 1D Patient not taking: Reported on 09/22/2021 09/06/21   09/08/21, PA-C  famotidine (PEPCID) 20 MG tablet Take 1 tablet (20 mg total) by mouth 2 (two) times daily. Patient not taking: Reported on 06/01/2019 05/14/19 06/02/19  06/04/19, MD    Allergies    Tegretol [carbamazepine] and Caffeine  Review of Systems   Review of Systems  Unable to perform ROS:  Mental status change  Neurological:  Positive for seizures.   Physical Exam Updated Vital Signs BP (!) 108/55   Pulse 92   Temp 97.9 F (36.6 C) (Oral)   Resp 16   SpO2 96%   Physical Exam Vitals and nursing note reviewed.  Constitutional:      General: He is not in acute distress.    Appearance: He is cachectic. He is ill-appearing. He is not diaphoretic.  HENT:     Head: Normocephalic and atraumatic.  Eyes:     Conjunctiva/sclera: Conjunctivae normal.     Pupils: Pupils are equal, round, and reactive to light.  Cardiovascular:     Rate and Rhythm: Normal rate and regular rhythm.     Heart sounds: Normal heart sounds. No murmur heard.   No friction rub. No gallop.  Pulmonary:     Effort: Pulmonary effort is normal. No respiratory distress.     Breath sounds: Normal breath sounds. No wheezing or rales.  Abdominal:     General: There is no distension.     Palpations: Abdomen is soft.     Tenderness: There is no abdominal tenderness. There is no guarding.  Musculoskeletal:     Cervical back: Normal range of motion.  Skin:    General: Skin is warm and dry.     Findings: Erythema (over dorsum right hand, no fluctuance) present.  Neurological:     Mental Status: He is oriented to person, place, and time. He is lethargic.     Comments: Able to lift bilateral arms and legs with equal strength with encouragement Is sleepy, difficult to wake, answers name, falls asleep     ED Results / Procedures / Treatments   Labs (all labs ordered are listed, but only abnormal results are displayed) Labs Reviewed  CBC WITH DIFFERENTIAL/PLATELET - Abnormal; Notable for the following components:      Result Value   WBC 3.5 (*)    All other components within normal limits  COMPREHENSIVE METABOLIC PANEL - Abnormal; Notable for the following components:   Creatinine, Ser 0.47 (*)    Calcium 8.5 (*)    AST 52 (*)    All other components within normal limits  RAPID URINE DRUG SCREEN, HOSP  PERFORMED - Abnormal; Notable for the following components:   Benzodiazepines POSITIVE (*)    All other components within normal limits  URINALYSIS, ROUTINE W REFLEX MICROSCOPIC - Abnormal; Notable for the following components:   Ketones, ur 80 (*)    All other components within normal limits  ETHANOL - Abnormal; Notable for the following components:   Alcohol, Ethyl (B) 393 (*)    All other components within normal limits  CD4/CD8 (T-HELPER/T-SUPPRESSOR CELL) - Abnormal; Notable for the following components:   CD8tox 42.61 (*)    Ratio 0.83 (*)    All other components within normal limits  CULTURE, BLOOD (ROUTINE X 2)  RESP PANEL BY RT-PCR (FLU A&B, COVID) ARPGX2  CULTURE, BLOOD (ROUTINE  X 2)  HIV-1 RNA QUANT-NO REFLEX-BLD    EKG None  Radiology CT Head Wo Contrast  Result Date: 09/22/2021 CLINICAL DATA:  Head trauma, fall. EXAM: CT HEAD WITHOUT CONTRAST CT CERVICAL SPINE WITHOUT CONTRAST TECHNIQUE: Multidetector CT imaging of the head and cervical spine was performed following the standard protocol without intravenous contrast. Multiplanar CT image reconstructions of the cervical spine were also generated. COMPARISON:  Head CT 09/06/2021.  Cervical spine CT 04/29/2021. FINDINGS: CT HEAD FINDINGS Brain: No evidence of acute infarction, hemorrhage, hydrocephalus, extra-axial collection or mass lesion/mass effect. Vascular: No hyperdense vessel or unexpected calcification. Skull: Normal. Negative for fracture or focal lesion. Sinuses/Orbits: No acute finding. Other: Right frontal skin staples are present. CT CERVICAL SPINE FINDINGS Alignment: Normal. Skull base and vertebrae: No acute fracture. No primary bone lesion or focal pathologic process. Soft tissues and spinal canal: No prevertebral fluid or swelling. No visible canal hematoma. Disc levels: Mild degenerative disc space narrowing is seen throughout the cervical spine similar to the prior study. No significant central canal or neural  foraminal stenosis at any level. Upper chest: Negative. Other: None. IMPRESSION: No acute intracranial process. No acute fracture or traumatic subluxation of the cervical spine. Electronically Signed   By: Darliss Cheney M.D.   On: 09/22/2021 18:01   CT Cervical Spine Wo Contrast  Result Date: 09/22/2021 CLINICAL DATA:  Head trauma, fall. EXAM: CT HEAD WITHOUT CONTRAST CT CERVICAL SPINE WITHOUT CONTRAST TECHNIQUE: Multidetector CT imaging of the head and cervical spine was performed following the standard protocol without intravenous contrast. Multiplanar CT image reconstructions of the cervical spine were also generated. COMPARISON:  Head CT 09/06/2021.  Cervical spine CT 04/29/2021. FINDINGS: CT HEAD FINDINGS Brain: No evidence of acute infarction, hemorrhage, hydrocephalus, extra-axial collection or mass lesion/mass effect. Vascular: No hyperdense vessel or unexpected calcification. Skull: Normal. Negative for fracture or focal lesion. Sinuses/Orbits: No acute finding. Other: Right frontal skin staples are present. CT CERVICAL SPINE FINDINGS Alignment: Normal. Skull base and vertebrae: No acute fracture. No primary bone lesion or focal pathologic process. Soft tissues and spinal canal: No prevertebral fluid or swelling. No visible canal hematoma. Disc levels: Mild degenerative disc space narrowing is seen throughout the cervical spine similar to the prior study. No significant central canal or neural foraminal stenosis at any level. Upper chest: Negative. Other: None. IMPRESSION: No acute intracranial process. No acute fracture or traumatic subluxation of the cervical spine. Electronically Signed   By: Darliss Cheney M.D.   On: 09/22/2021 18:01    Procedures Procedures   Medications Ordered in ED Medications  LORazepam (ATIVAN) injection 0-4 mg (2 mg Intravenous Given 09/23/21 0357)    Or  LORazepam (ATIVAN) tablet 0-4 mg ( Oral See Alternative 09/23/21 0357)  LORazepam (ATIVAN) injection 0-4 mg (has  no administration in time range)    Or  LORazepam (ATIVAN) tablet 0-4 mg (has no administration in time range)  thiamine tablet 100 mg (has no administration in time range)    Or  thiamine (B-1) injection 100 mg (has no administration in time range)  acetaminophen (TYLENOL) tablet 650 mg (has no administration in time range)  lactated ringers bolus 1,000 mL (0 mLs Intravenous Stopped 09/22/21 1900)  levETIRAcetam (KEPPRA) IVPB 1500 mg/ 100 mL premix (0 mg Intravenous Stopped 09/22/21 1750)  ketorolac (TORADOL) 30 MG/ML injection 30 mg (30 mg Intravenous Given 09/23/21 0034)    ED Course  I have reviewed the triage vital signs and the nursing notes.  Pertinent labs &  imaging results that were available during my care of the patient were reviewed by me and considered in my medical decision making (see chart for details).    MDM Rules/Calculators/A&P                            35 year old male with history of HIV last CD4 count 423 in May, seizures, alcohol abuse and dependence, major depressive disorder, subdural hematoma, polysubstance abuse, homelessness, presents with concern for witnessed seizure.  Does not appear to be in alcohol withdrawal on exam at this time.  Appears postictal, intoxicated or related to other substance.  Unknown if he has history of IVDU but has markings on his right hand, surrounding erythema, is ill appearing, ordered blood cx for screening, as well as CBC/CMP, hiv/cd4 to assist in follow up, and CT head due to fall/AMS and CT Cspine given fall/AMS.    CT completed shows no acute abnormalities.  Labs no clinically significant abnormalities. COVID/flu testing negative.  Given Keppra for possible seizure. ETOH level 393. Reports he only has history of seizures related to etoh withdrawal but appears very intoxicated and not withdrawing on initial exam.  Unclear reliability of witnessed seizure, and given no prior history of unprovoked seizure, do not feel  continued antiepileptics necessary but do recommend outpatient Neurology follow up.  Denies other substance use today as a contributor.  He is afebrile, last CD4 ok, doubt intrcranial infection as etiology of seizure, and he has now returned to baseline.   After fluids, observation, he has returned to baseline.  The are of concern on his right hand no longer appears erythematous and reports the lesions on his hand are due to falling and denies IVDU.    He now reports he is suicidal, with a plan to drink himself to death.  Is specific about the type of alcohol he would use "it would be easy and painless."     CIWA protocol placed. TTS consulted. He is medically clear for evaluation. He is here voluntarily.   Final Clinical Impression(s) / ED Diagnoses Final diagnoses:  Alcoholic intoxication without complication (HCC)  Seizures (HCC)  Suicidal ideation    Rx / DC Orders ED Discharge Orders     None        Alvira Monday, MD 09/23/21 1046

## 2021-09-22 NOTE — ED Triage Notes (Signed)
EMS reports homeless, bystanders called for seizure. States he has seizures when withdrawal from ETOH. Pt states last drink about an hour or two ago.  BP 144/90 HR 95 RR 16 Sp02 97 RA CBG 104

## 2021-09-23 LAB — BLOOD CULTURE ID PANEL (REFLEXED) - BCID2

## 2021-09-23 LAB — URINALYSIS, ROUTINE W REFLEX MICROSCOPIC
Bilirubin Urine: NEGATIVE
Glucose, UA: NEGATIVE mg/dL
Hgb urine dipstick: NEGATIVE
Ketones, ur: 80 mg/dL — AB
Leukocytes,Ua: NEGATIVE
Nitrite: NEGATIVE
Protein, ur: NEGATIVE mg/dL
Specific Gravity, Urine: 1.02 (ref 1.005–1.030)
pH: 6 (ref 5.0–8.0)

## 2021-09-23 LAB — RAPID URINE DRUG SCREEN, HOSP PERFORMED
Amphetamines: NOT DETECTED
Barbiturates: NOT DETECTED
Benzodiazepines: POSITIVE — AB
Cocaine: NOT DETECTED
Opiates: NOT DETECTED
Tetrahydrocannabinol: NOT DETECTED

## 2021-09-23 LAB — CD4/CD8 (T-HELPER/T-SUPPRESSOR CELL)
CD4 absolute: 518 /uL (ref 400–1790)
CD4%: 35.39 % (ref 33–65)
CD8 T Cell Abs: 624 /uL (ref 190–1000)
CD8tox: 42.61 % — ABNORMAL HIGH (ref 12–40)
Ratio: 0.83 — ABNORMAL LOW (ref 1.0–3.0)
Total lymphocyte count: 1465 /uL (ref 1000–4000)

## 2021-09-23 MED ORDER — KETOROLAC TROMETHAMINE 30 MG/ML IJ SOLN
30.0000 mg | Freq: Once | INTRAMUSCULAR | Status: AC
  Administered 2021-09-23: 30 mg via INTRAVENOUS
  Filled 2021-09-23: qty 1

## 2021-09-23 MED ORDER — LORAZEPAM 1 MG PO TABS: 0.0000 mg | ORAL_TABLET | Freq: Two times a day (BID) | ORAL | Status: DC

## 2021-09-23 MED ORDER — LORAZEPAM 2 MG/ML IJ SOLN: 0.0000 mg | Freq: Two times a day (BID) | INTRAMUSCULAR | Status: DC

## 2021-09-23 MED ORDER — THIAMINE HCL 100 MG/ML IJ SOLN: 100.0000 mg | Freq: Every day | INTRAMUSCULAR | Status: DC

## 2021-09-23 MED ORDER — LORAZEPAM 1 MG PO TABS: 0.0000 mg | ORAL_TABLET | Freq: Four times a day (QID) | ORAL | Status: DC

## 2021-09-23 MED ORDER — LORAZEPAM 2 MG/ML IJ SOLN
0.0000 mg | Freq: Four times a day (QID) | INTRAMUSCULAR | Status: DC
  Administered 2021-09-23: 2 mg via INTRAVENOUS
  Filled 2021-09-23: qty 1

## 2021-09-23 MED ORDER — THIAMINE HCL 100 MG PO TABS: 100.0000 mg | ORAL_TABLET | Freq: Every day | ORAL | Status: DC

## 2021-09-23 MED ORDER — ACETAMINOPHEN 325 MG PO TABS: 650.0000 mg | ORAL_TABLET | ORAL | Status: DC | PRN

## 2021-09-23 NOTE — Progress Notes (Signed)
PHARMACY - PHYSICIAN COMMUNICATION CRITICAL VALUE ALERT - BLOOD CULTURE IDENTIFICATION (BCID)  Gary [name] is an 35 y.o. male who presented to [facility] [address] on 09/22/2021 with a chief complaint of seizure related to ETOH withdrawal  Assessment:  11/14 BCx, BCID resulted 1/4 bottles ( aerobic) with GPR  Name of physician (or Provider) Contacted: Dr Rhunette Croft  Current antibiotics: none  Changes to prescribed antibiotics recommended: no abx needed   No results found for this or any previous visit.  Otho Bellows PharmD 09/23/2021  10:40 AM

## 2021-09-23 NOTE — ED Notes (Signed)
Pt wanded by security. 

## 2021-09-23 NOTE — Progress Notes (Signed)
SW send referral out to [employer] [facility] to assist with homelessness.   Valentina Shaggy.Ayham Word, MSW, LCSWA [facility]  Transitions of Care Clinical Social Worker I Direct Dial: [phone]  Fax: [phone] [email or web address]
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
## 2021-09-23 NOTE — ED Notes (Signed)
Pt agitated asking to be d/c'd.  Pt declined VS prior to d/c.  IV removed.  D/c instructions, follow up and return precautions d/w pt.  Pt verbalized understanding of the above. Ambulatory out of department.

## 2021-09-23 NOTE — ED Notes (Signed)
Pt has been changed into burgundy scrubs, Pt has 2 belonging bags in the cabinet 1-4 at the nurses station.

## 2021-09-23 NOTE — BH Assessment (Signed)
Clinician reviewed pt's chart in preparation to complete pt's MH Assessment but pt is not medically cleared at this time. TTS to attempt MH Assessment at a later time after pt has been medically cleared.

## 2021-09-26 ENCOUNTER — Other Ambulatory Visit: Payer: Self-pay

## 2021-09-26 ENCOUNTER — Emergency Department (HOSPITAL_COMMUNITY)
Admission: EM | Admit: 2021-09-26 | Discharge: 2021-09-27 | Disposition: A | Discharge status: Home / Self Care | Payer: Self-pay | Attending: Emergency Medicine | Admitting: Emergency Medicine

## 2021-09-26 ENCOUNTER — Encounter (HOSPITAL_COMMUNITY): Payer: Self-pay | Admitting: Emergency Medicine

## 2021-09-26 ENCOUNTER — Emergency Department (HOSPITAL_COMMUNITY): Payer: Self-pay

## 2021-09-26 DIAGNOSIS — Y908 Blood alcohol level of 240 mg/100 ml or more: Secondary | ICD-10-CM | POA: Insufficient documentation

## 2021-09-26 DIAGNOSIS — F17210 Nicotine dependence, cigarettes, uncomplicated: Secondary | ICD-10-CM | POA: Insufficient documentation

## 2021-09-26 DIAGNOSIS — Z21 Asymptomatic human immunodeficiency virus [HIV] infection status: Secondary | ICD-10-CM | POA: Insufficient documentation

## 2021-09-26 DIAGNOSIS — F10129 Alcohol abuse with intoxication, unspecified: Secondary | ICD-10-CM | POA: Insufficient documentation

## 2021-09-26 DIAGNOSIS — R519 Headache, unspecified: Secondary | ICD-10-CM | POA: Insufficient documentation

## 2021-09-26 DIAGNOSIS — F10920 Alcohol use, unspecified with intoxication, uncomplicated: Secondary | ICD-10-CM

## 2021-09-26 LAB — CULTURE, BLOOD (ROUTINE X 2)
Special Requests: ADEQUATE
Special Requests: ADEQUATE

## 2021-09-26 LAB — CBC WITH DIFFERENTIAL/PLATELET
Abs Immature Granulocytes: 0 10*3/uL (ref 0.00–0.07)
Basophils Absolute: 0 10*3/uL (ref 0.0–0.1)
Basophils Relative: 1 %
Eosinophils Absolute: 0.1 10*3/uL (ref 0.0–0.5)
Eosinophils Relative: 3 %
HCT: 39.8 % (ref 39.0–52.0)
Hemoglobin: 13.8 g/dL (ref 13.0–17.0)
Immature Granulocytes: 0 %
Lymphocytes Relative: 55 %
Lymphs Abs: 1.6 10*3/uL (ref 0.7–4.0)
MCH: 33.3 pg (ref 26.0–34.0)
MCHC: 34.7 g/dL (ref 30.0–36.0)
MCV: 96.1 fL (ref 80.0–100.0)
Monocytes Absolute: 0.2 10*3/uL (ref 0.1–1.0)
Monocytes Relative: 6 %
Neutro Abs: 1.1 10*3/uL — ABNORMAL LOW (ref 1.7–7.7)
Neutrophils Relative %: 35 %
Platelets: 144 10*3/uL — ABNORMAL LOW (ref 150–400)
RBC: 4.14 MIL/uL — ABNORMAL LOW (ref 4.22–5.81)
RDW: 14.7 % (ref 11.5–15.5)
WBC: 3 10*3/uL — ABNORMAL LOW (ref 4.0–10.5)
nRBC: 0 % (ref 0.0–0.2)

## 2021-09-26 LAB — COMPREHENSIVE METABOLIC PANEL
ALT: 35 U/L (ref 0–44)
AST: 58 U/L — ABNORMAL HIGH (ref 15–41)
Albumin: 4 g/dL (ref 3.5–5.0)
Alkaline Phosphatase: 56 U/L (ref 38–126)
Anion gap: 9 (ref 5–15)
BUN: <5 mg/dL — ABNORMAL LOW (ref 6–20)
CO2: 27 mmol/L (ref 22–32)
Calcium: 8.8 mg/dL — ABNORMAL LOW (ref 8.9–10.3)
Chloride: 103 mmol/L (ref 98–111)
Creatinine, Ser: 0.66 mg/dL (ref 0.61–1.24)
GFR, Estimated: >60 mL/min (ref >60)
Glucose, Bld: 103 mg/dL — ABNORMAL HIGH (ref 70–99)
Potassium: 3.8 mmol/L (ref 3.5–5.1)
Sodium: 139 mmol/L (ref 135–145)
Total Bilirubin: 0.5 mg/dL (ref 0.3–1.2)
Total Protein: 7 g/dL (ref 6.5–8.1)

## 2021-09-26 LAB — RAPID URINE DRUG SCREEN, HOSP PERFORMED
Amphetamines: NOT DETECTED
Barbiturates: NOT DETECTED
Benzodiazepines: NOT DETECTED
Cocaine: NOT DETECTED
Opiates: NOT DETECTED
Tetrahydrocannabinol: NOT DETECTED

## 2021-09-26 LAB — ETHANOL: Alcohol, Ethyl (B): 482 mg/dL (ref <10)

## 2021-09-26 IMAGING — CT CT HEAD W/O CM
4 series · 16 of 47 positions shown, 18 images · non-contrast
Comparison: None.

CLINICAL DATA: Status post trauma.

EXAM:
CT HEAD WITHOUT CONTRAST
TECHNIQUE: Contiguous axial images were obtained from the base of the skull
through the vertex without intravenous contrast.

[Series 3: head without · axial · non-contrast · 0.45mm/px · z∈[+1029,+1149]mm · 7 of 34 slices shown, 9 images]
[im 5/34  brain]
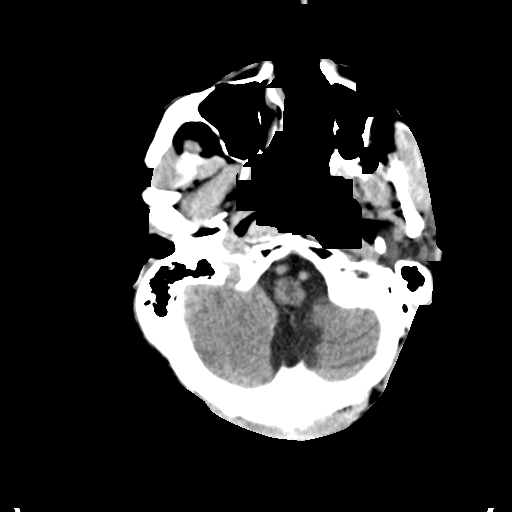
[im 5/34  bone]
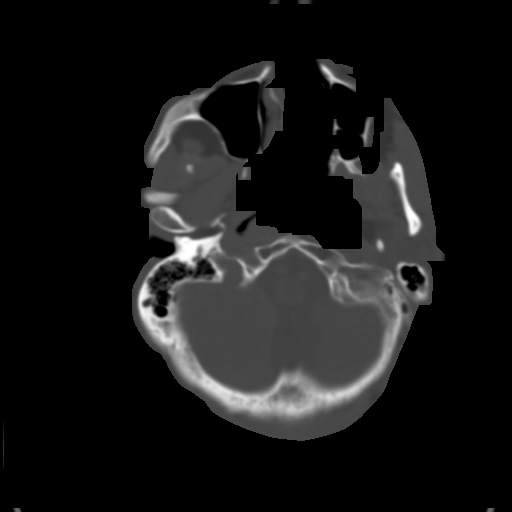
[im 9/34  brain]
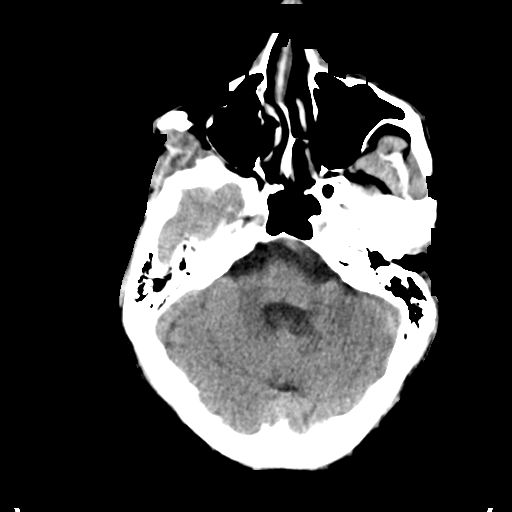
[im 13/34  brain]
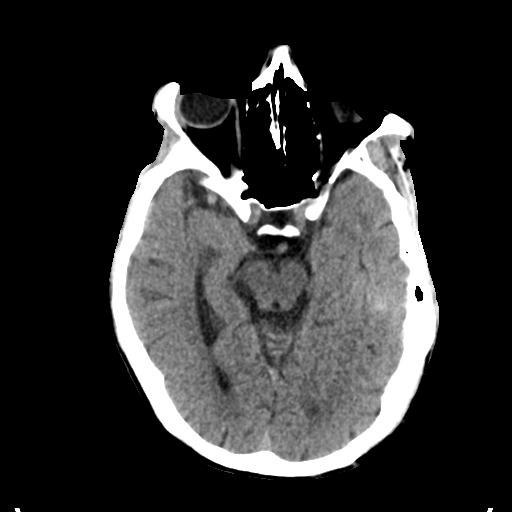
[im 17/34  brain]
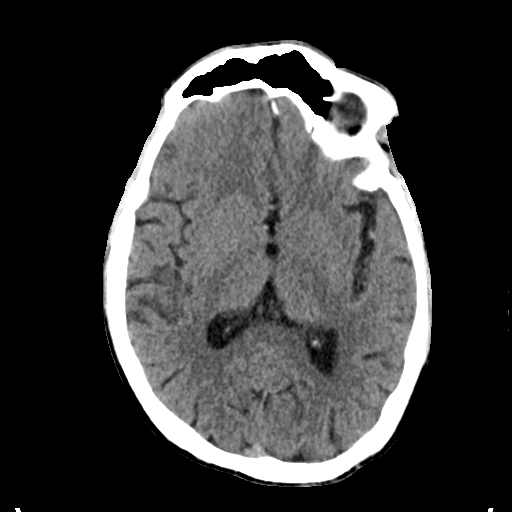
[im 21/34  brain]
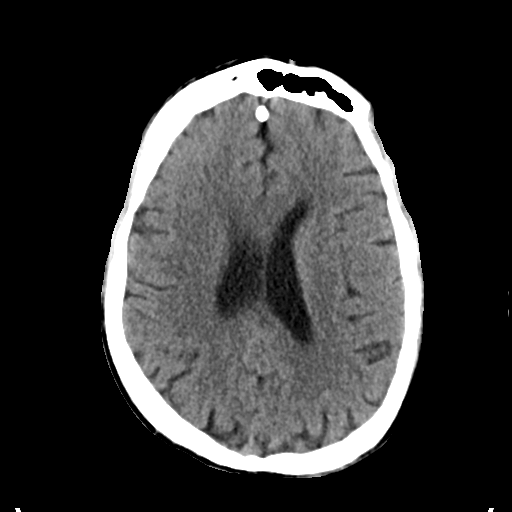
[im 21/34  bone]
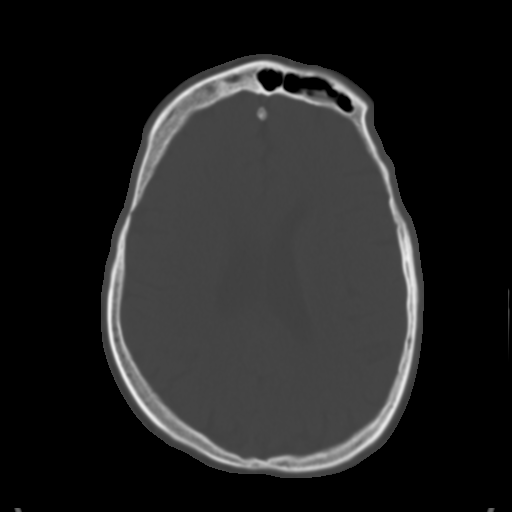
[im 25/34  brain]
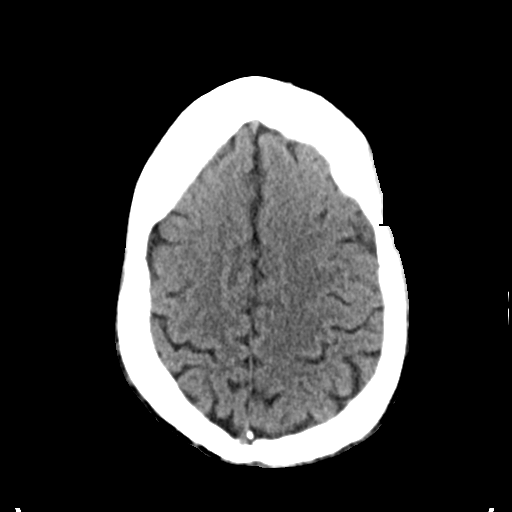
[im 29/34  brain]
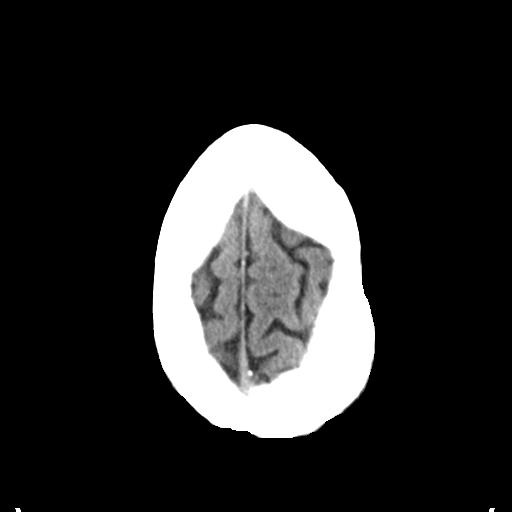

[Series 4: head bone · axial · 0.45mm/px · z∈[+1025,+1059]mm · 3 of 85 slices shown]
[im 9/85  bone]
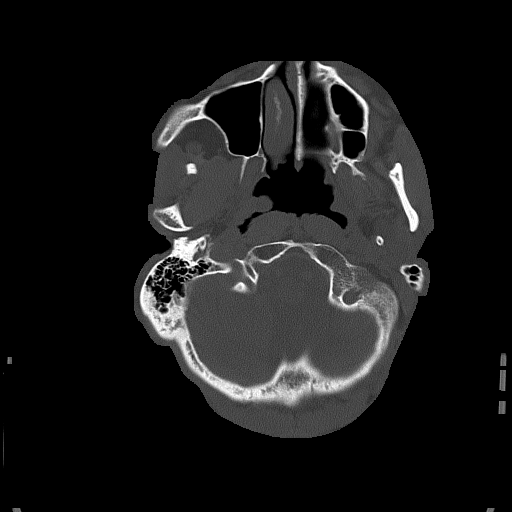
[im 17/85  bone]
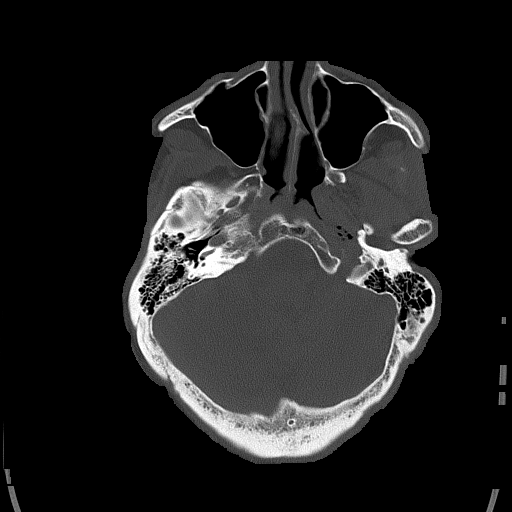
[im 26/85  bone]
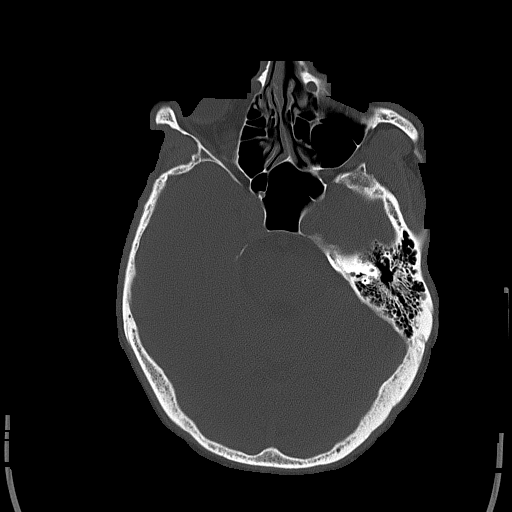

[Series 5: head without cor · coronal · non-contrast · 0.36mm/px · 3 of 75 slices shown]
[im 25/75  brain]
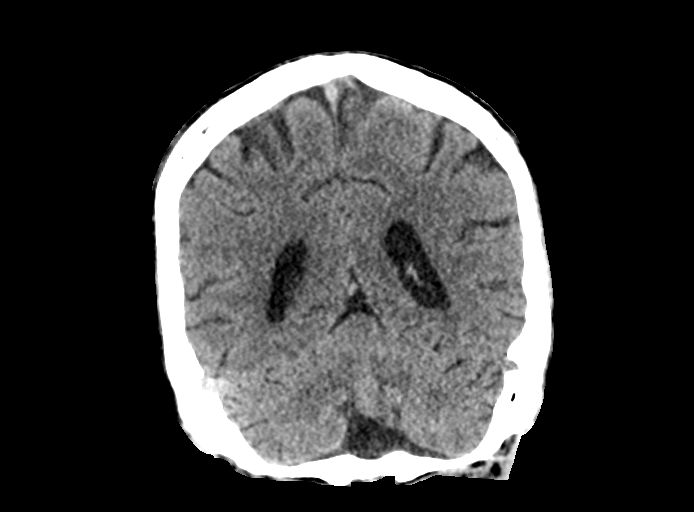
[im 33/75  brain]
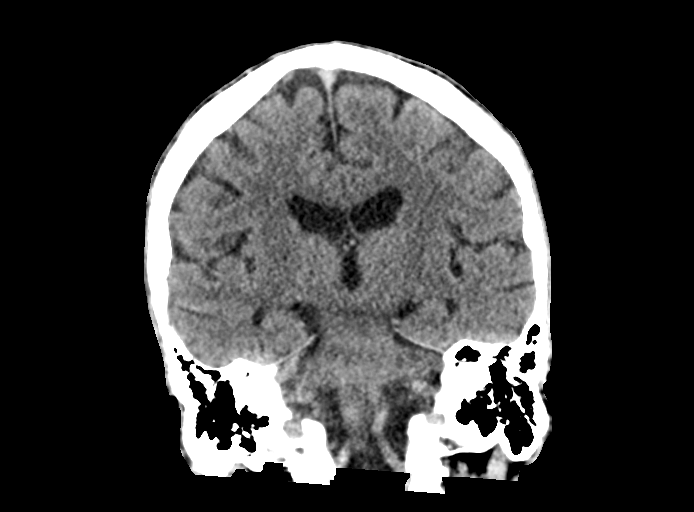
[im 42/75  brain]
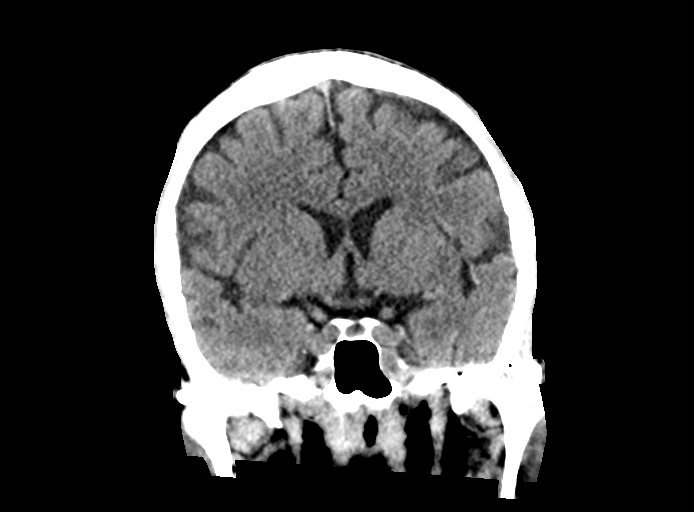

[Series 6: head without sag · sagittal · non-contrast · 0.39mm/px · 3 of 67 slices shown]
[im 26/67  brain]
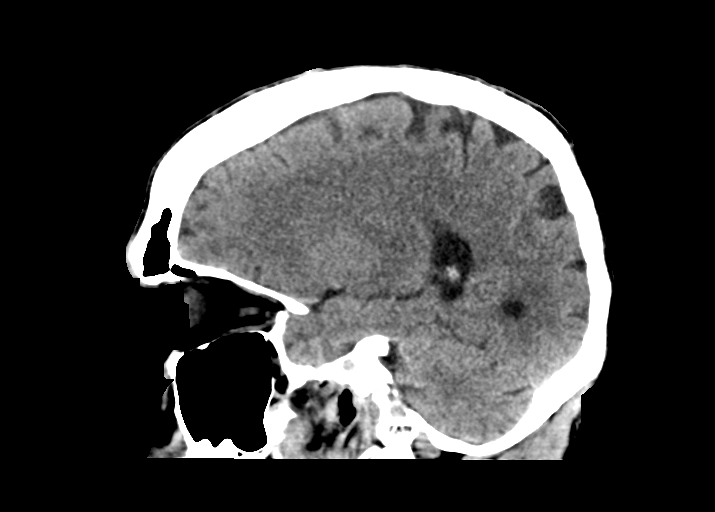
[im 34/67  brain]
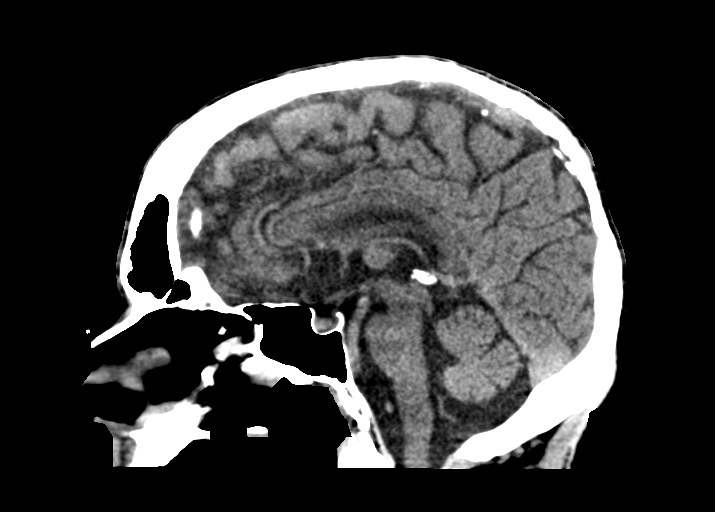
[im 41/67  brain]
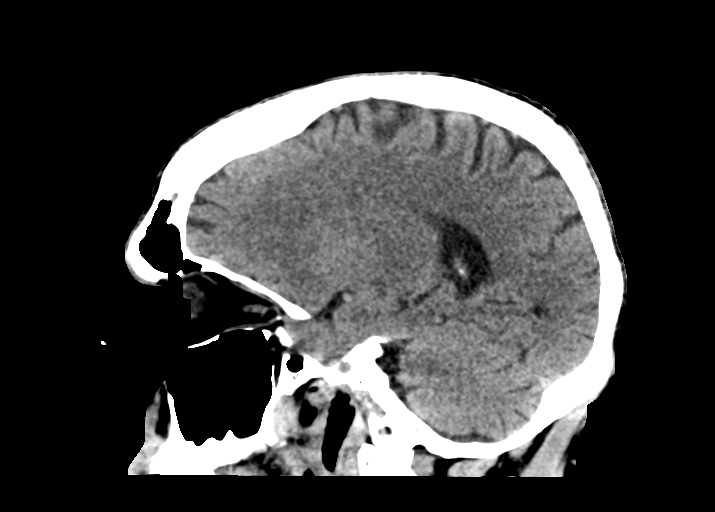

[16 of 47 positions shown; findings below may reference images not displayed]

FINDINGS: Brain: No evidence of acute infarction, hemorrhage, hydrocephalus,
extra-axial collection or mass lesion/mass effect.

Vascular: No hyperdense vessel or unexpected calcification.

Skull: Normal. Negative for fracture or focal lesion.

Sinuses/Orbits: No acute finding.

Other: None.
IMPRESSION: No acute intracranial pathology.

## 2021-09-26 IMAGING — CT CT MAXILLOFACIAL W/O CM
3 series · 16 of 47 positions shown, 19 images · non-contrast
Comparison: None.

CLINICAL DATA: Status post trauma.

EXAM:
CT MAXILLOFACIAL WITHOUT CONTRAST
TECHNIQUE: Multidetector CT imaging of the maxillofacial structures was
performed. Multiplanar CT image reconstructions were also generated.

[Series 3: facialbone 2.0 st · axial · 0.39mm/px · z∈[+942,+1104]mm · 10 of 95 slices shown, 13 images]
[im 7/95  brain]
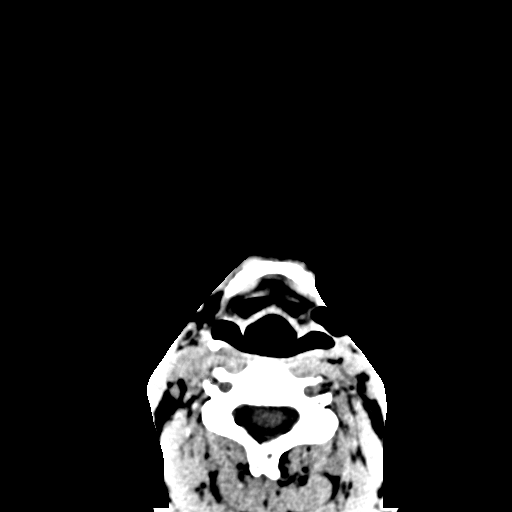
[im 7/95  bone]
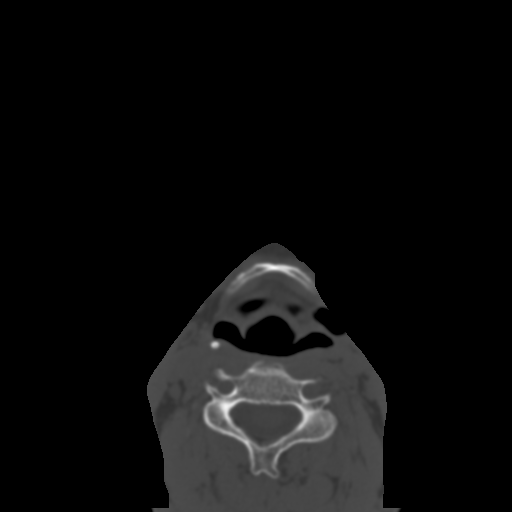
[im 17/95  bone]
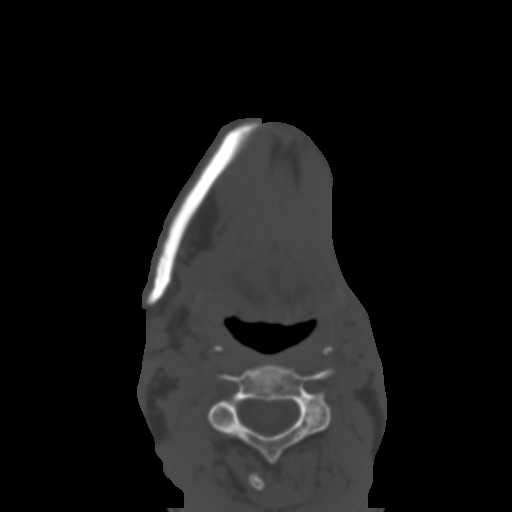
[im 26/95  bone]
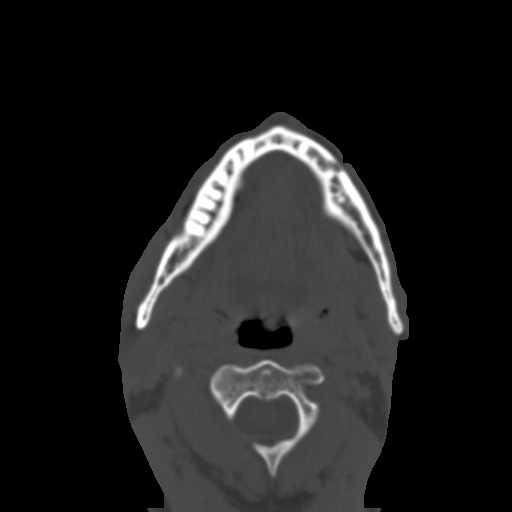
[im 33/95  bone]
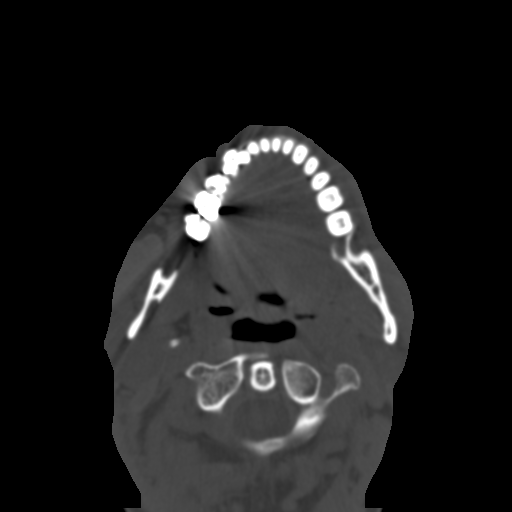
[im 43/95  brain]
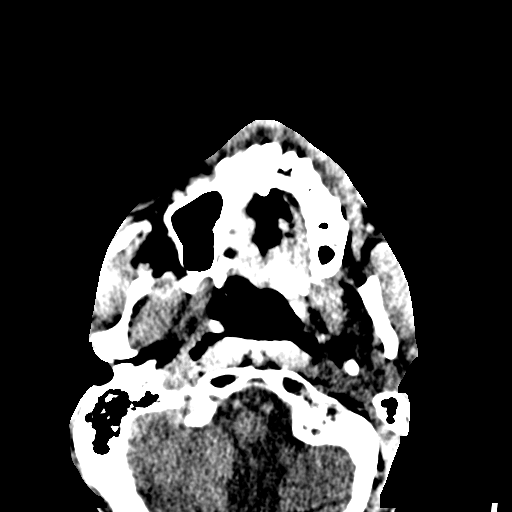
[im 43/95  bone]
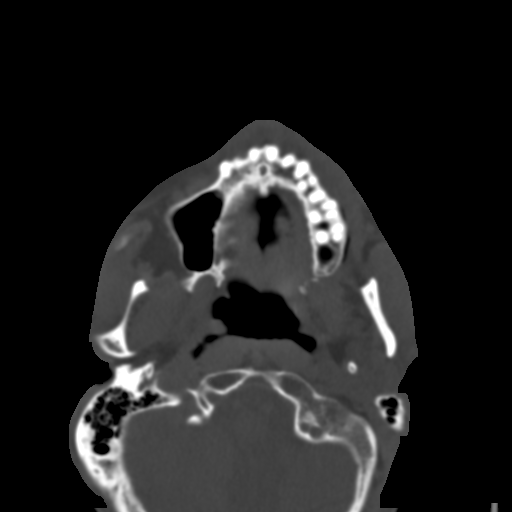
[im 52/95  bone]
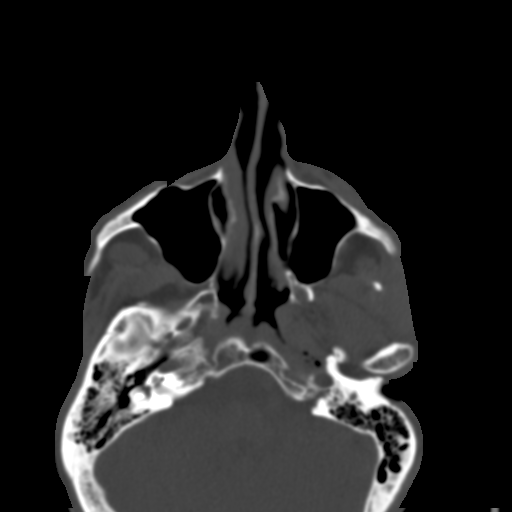
[im 62/95  bone]
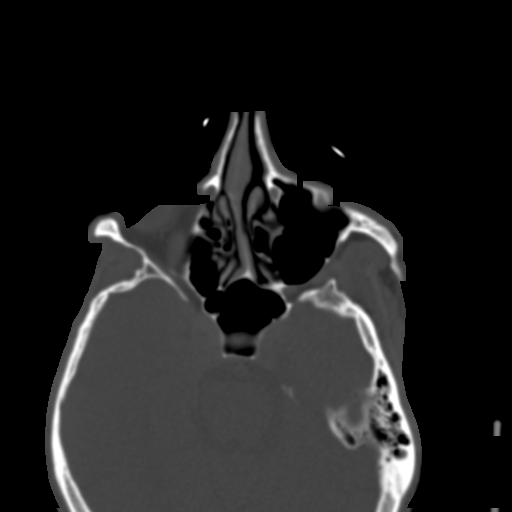
[im 72/95  bone]
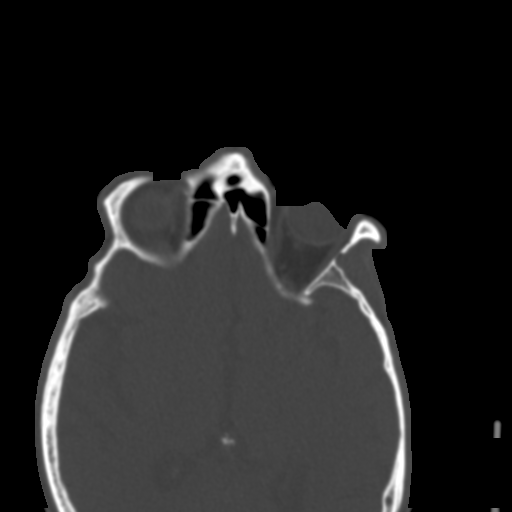
[im 78/95  brain]
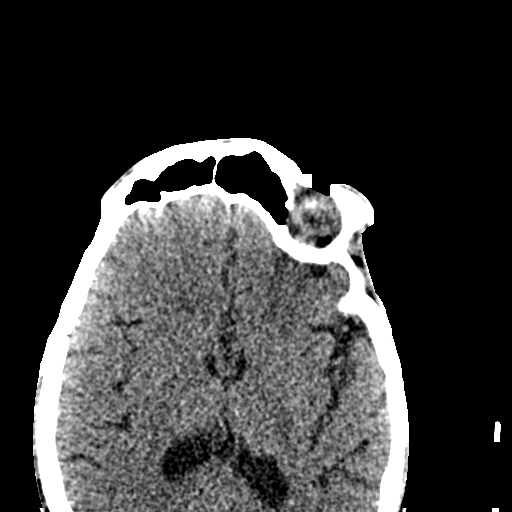
[im 78/95  bone]
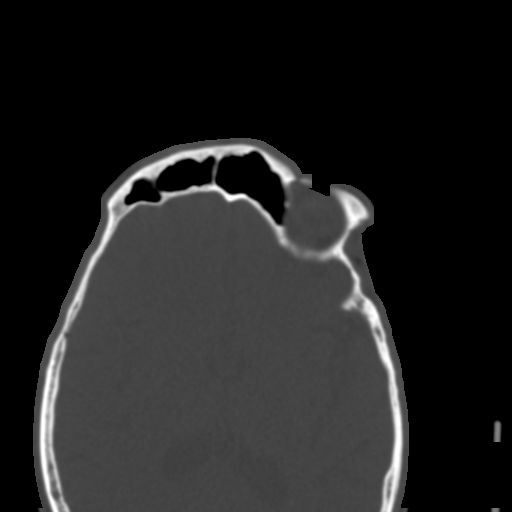
[im 88/95  bone]
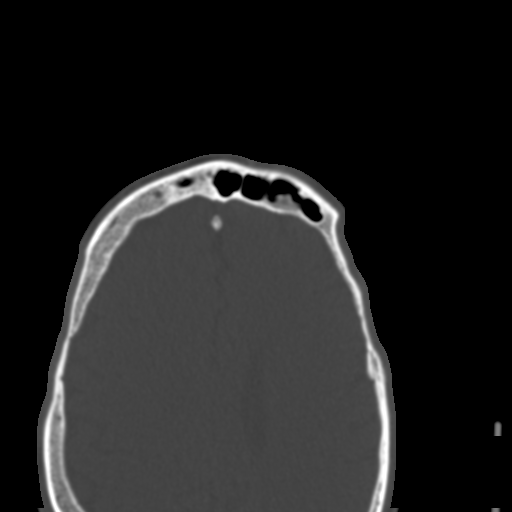

[Series 7: facialbone 2.0 cor st · coronal · 0.37mm/px · 3 of 78 slices shown]
[im 26/78  bone]
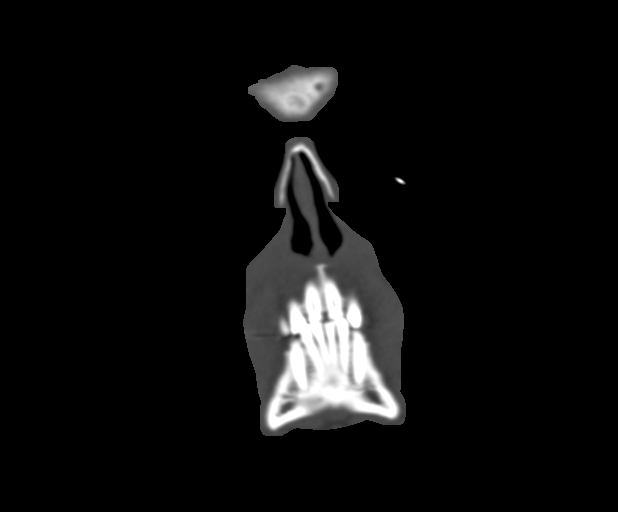
[im 35/78  bone]
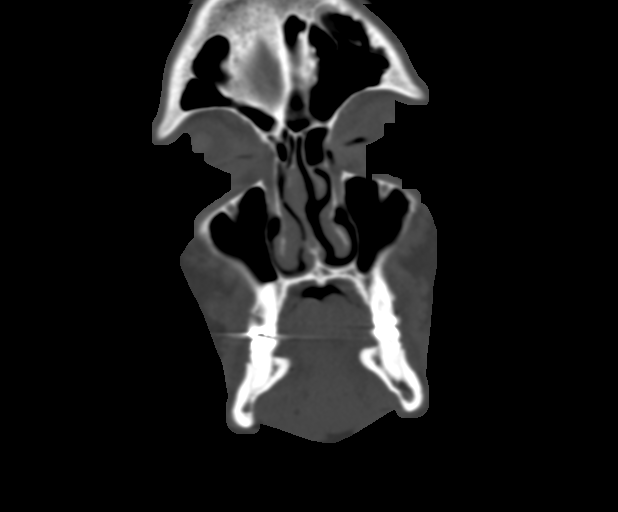
[im 43/78  bone]
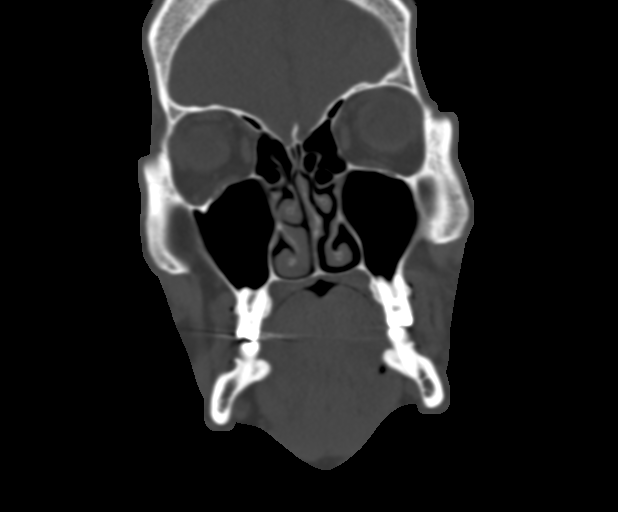

[Series 8: facialbone 2.0 sag st · sagittal · 0.34mm/px · 3 of 93 slices shown]
[im 31/93  bone]
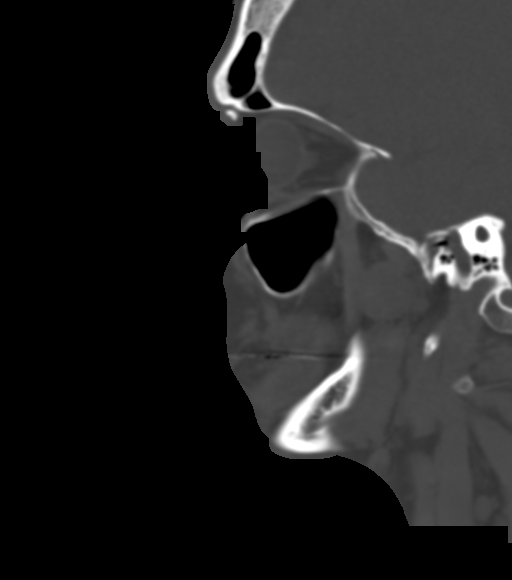
[im 47/93  bone]
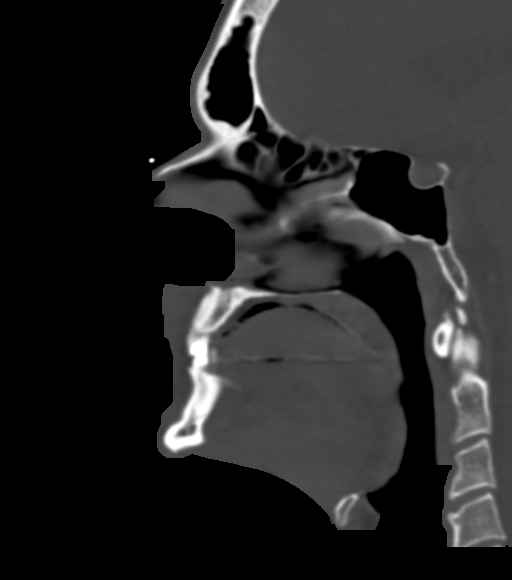
[im 62/93  bone]
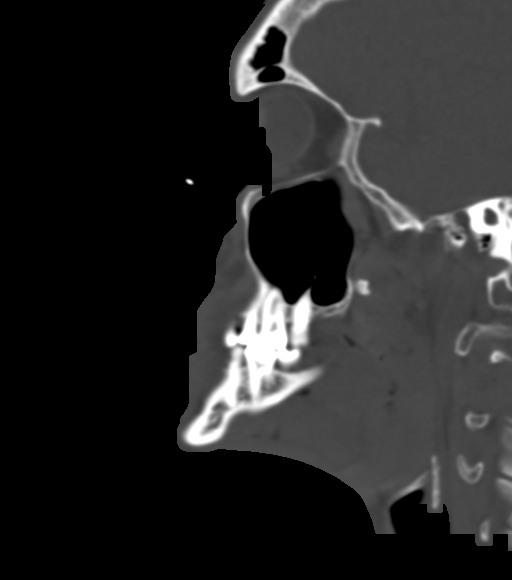

[16 of 47 positions shown; findings below may reference images not displayed]

FINDINGS: Osseous: No fracture or mandibular dislocation. No destructive
process.

Orbits: Negative. No traumatic or inflammatory finding.

Sinuses: Clear.

Soft tissues: Negative.

Limited intracranial: No significant or unexpected finding.
IMPRESSION: Negative for acute fracture or dislocation of the facial bones.

## 2021-09-26 NOTE — ED Triage Notes (Signed)
Patient here with alcohol withdrawal.  Patient doesn't know when his last drink was.  Patient states he has seizures with withdrawal.
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
## 2021-09-27 ENCOUNTER — Emergency Department (HOSPITAL_COMMUNITY)
Admission: EM | Admit: 2021-09-27 | Discharge: 2021-09-28 | Disposition: A | Discharge status: Home / Self Care | Payer: Self-pay | Attending: Emergency Medicine | Admitting: Emergency Medicine

## 2021-09-27 ENCOUNTER — Telehealth: Payer: Self-pay | Admitting: Emergency Medicine

## 2021-09-27 ENCOUNTER — Other Ambulatory Visit: Payer: Self-pay

## 2021-09-27 DIAGNOSIS — F10229 Alcohol dependence with intoxication, unspecified: Secondary | ICD-10-CM | POA: Insufficient documentation

## 2021-09-27 DIAGNOSIS — Z5321 Procedure and treatment not carried out due to patient leaving prior to being seen by health care provider: Secondary | ICD-10-CM | POA: Insufficient documentation

## 2021-09-27 NOTE — ED Triage Notes (Signed)
Per EMS, patient from gas station, patient requesting alcohol withdrawal. Last beer x1 hour ago.

## 2021-09-27 NOTE — ED Provider Notes (Signed)
Emergency Medicine Provider Triage Evaluation Note  Gary [name] , a 35 y.o. male  was evaluated in triage.  Pt complains of alcohol problem.  He called EMS and requested help for alcohol withdrawal.  When I asked when his last drink was he tells me "well I am not a retard, you can smell it on my breath I am sure."  He is unwilling to further elaborate.    Review of Systems  Positive: Alcohol intoxication Negative: pain  Physical Exam  BP 130/89   Pulse (!) 101   Temp 98.4 F (36.9 C) (Oral)   Resp 18   SpO2 94%  Gen:   Awake, no distress   Resp:  Normal effort  MSK:   Moves extremities without difficulty  Other:  Speech is slurred.  Moves all 4 extremities.   Medical Decision Making  Medically screening exam initiated at 9:26 PM.  Appropriate orders placed.  Gary [name] was informed that the remainder of the evaluation will be completed by another provider, this initial triage assessment does not replace that evaluation, and the importance of remaining in the ED until their evaluation is complete.  Note: Portions of this report may have been transcribed using voice recognition software. Every effort was made to ensure accuracy; however, inadvertent computerized transcription errors may be present    Norman Clay 09/27/21 2128    Gerhard Munch, MD 09/28/21 2330

## 2021-09-27 NOTE — Discharge Instructions (Signed)
Please return to the Emergency Department if you have worsening symptoms.
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
## 2021-09-27 NOTE — Telephone Encounter (Signed)
Post ED Visit - Positive Culture Follow-up  Culture report reviewed by antimicrobial stewardship pharmacist: [name] []  , Pharm.D. []  Enzo Bi, Pharm.D., BCPS AQ-ID []  , Pharm.D., BCPS []  Celedonio Miyamoto, .D., BCPS []  [place], .D., BCPS, AAHIVP []  Georgina Pillion, Pharm.D., BCPS, AAHIVP []  [address], PharmD, BCPS []  , PharmD, BCPS []  [place], PharmD, BCPS []  [address], PharmD []  , PharmD, BCPS []  Estella Husk, PharmD  Pharmacy Team []  Lysle Pearl, PharmD []  , PharmD []  Phillips Climes, PharmD []  , Rph []  Agapito Games) , PharmD [x]  Verlan Friends, PharmD []  , PharmD []  Mervyn Gay, PharmD []  , PharmD []  Vinnie Level, PharmD []  [facility], PharmD []  , PharmD []  Len Childs, PharmD   Positive Blood culture Possible contaminant.No further patient follow-up is required at this time. Mary-Ashlyn Tucker Pharm D  Ambriel Gorelick C Laci Frenkel 09/27/2021, 4:09 PM

## 2021-09-27 NOTE — ED Notes (Signed)
Pt verbalized desire to discharge.  PA made aware/

## 2021-09-28 NOTE — ED Notes (Signed)
Patient called for rooming x 1

## 2021-09-29 ENCOUNTER — Emergency Department (HOSPITAL_COMMUNITY)
Admission: EM | Admit: 2021-09-29 | Discharge: 2021-09-30 | Disposition: A | Discharge status: Home / Self Care | Payer: Self-pay | Attending: Emergency Medicine | Admitting: Emergency Medicine

## 2021-09-29 ENCOUNTER — Other Ambulatory Visit: Payer: Self-pay

## 2021-09-29 ENCOUNTER — Encounter (HOSPITAL_COMMUNITY): Payer: Self-pay

## 2021-09-29 DIAGNOSIS — F17210 Nicotine dependence, cigarettes, uncomplicated: Secondary | ICD-10-CM | POA: Insufficient documentation

## 2021-09-29 DIAGNOSIS — R000 Tachycardia, unspecified: Secondary | ICD-10-CM | POA: Insufficient documentation

## 2021-09-29 DIAGNOSIS — Y908 Blood alcohol level of 240 mg/100 ml or more: Secondary | ICD-10-CM | POA: Insufficient documentation

## 2021-09-29 DIAGNOSIS — Z21 Asymptomatic human immunodeficiency virus [HIV] infection status: Secondary | ICD-10-CM | POA: Insufficient documentation

## 2021-09-29 DIAGNOSIS — Z79899 Other long term (current) drug therapy: Secondary | ICD-10-CM | POA: Insufficient documentation

## 2021-09-29 DIAGNOSIS — F10129 Alcohol abuse with intoxication, unspecified: Secondary | ICD-10-CM | POA: Insufficient documentation

## 2021-09-29 DIAGNOSIS — F10920 Alcohol use, unspecified with intoxication, uncomplicated: Secondary | ICD-10-CM

## 2021-09-29 LAB — COMPREHENSIVE METABOLIC PANEL
ALT: 56 U/L — ABNORMAL HIGH (ref 0–44)
AST: 99 U/L — ABNORMAL HIGH (ref 15–41)
Albumin: 4.1 g/dL (ref 3.5–5.0)
Alkaline Phosphatase: 62 U/L (ref 38–126)
Anion gap: 11 (ref 5–15)
BUN: 10 mg/dL (ref 6–20)
CO2: 24 mmol/L (ref 22–32)
Calcium: 8.4 mg/dL — ABNORMAL LOW (ref 8.9–10.3)
Chloride: 104 mmol/L (ref 98–111)
Creatinine, Ser: 0.62 mg/dL (ref 0.61–1.24)
GFR, Estimated: >60 mL/min (ref >60)
Glucose, Bld: 138 mg/dL — ABNORMAL HIGH (ref 70–99)
Potassium: 3.6 mmol/L (ref 3.5–5.1)
Sodium: 139 mmol/L (ref 135–145)
Total Bilirubin: 0.4 mg/dL (ref 0.3–1.2)
Total Protein: 7.4 g/dL (ref 6.5–8.1)

## 2021-09-29 LAB — CBC WITH DIFFERENTIAL/PLATELET
Abs Immature Granulocytes: 0 10*3/uL (ref 0.00–0.07)
Basophils Absolute: 0 10*3/uL (ref 0.0–0.1)
Basophils Relative: 1 %
Eosinophils Absolute: 0.1 10*3/uL (ref 0.0–0.5)
Eosinophils Relative: 2 %
HCT: 39.2 % (ref 39.0–52.0)
Hemoglobin: 13.9 g/dL (ref 13.0–17.0)
Immature Granulocytes: 0 %
Lymphocytes Relative: 47 %
Lymphs Abs: 1.6 10*3/uL (ref 0.7–4.0)
MCH: 33.7 pg (ref 26.0–34.0)
MCHC: 35.5 g/dL (ref 30.0–36.0)
MCV: 95.1 fL (ref 80.0–100.0)
Monocytes Absolute: 0.3 10*3/uL (ref 0.1–1.0)
Monocytes Relative: 8 %
Neutro Abs: 1.4 10*3/uL — ABNORMAL LOW (ref 1.7–7.7)
Neutrophils Relative %: 42 %
Platelets: 110 10*3/uL — ABNORMAL LOW (ref 150–400)
RBC: 4.12 MIL/uL — ABNORMAL LOW (ref 4.22–5.81)
RDW: 14.4 % (ref 11.5–15.5)
WBC: 3.3 10*3/uL — ABNORMAL LOW (ref 4.0–10.5)
nRBC: 0 % (ref 0.0–0.2)

## 2021-09-29 LAB — RAPID URINE DRUG SCREEN, HOSP PERFORMED
Amphetamines: NOT DETECTED
Barbiturates: NOT DETECTED
Benzodiazepines: NOT DETECTED
Cocaine: NOT DETECTED
Opiates: NOT DETECTED
Tetrahydrocannabinol: NOT DETECTED

## 2021-09-29 LAB — ETHANOL: Alcohol, Ethyl (B): 459 mg/dL (ref <10)

## 2021-09-29 MED ORDER — IBUPROFEN 200 MG PO TABS
400.0000 mg | ORAL_TABLET | Freq: Once | ORAL | Status: AC
  Administered 2021-09-29: 400 mg via ORAL
  Filled 2021-09-29: qty 2

## 2021-09-29 MED ORDER — SODIUM CHLORIDE 0.9 % IV BOLUS
1000.0000 mL | Freq: Once | INTRAVENOUS | Status: AC
  Administered 2021-09-29: 1000 mL via INTRAVENOUS

## 2021-09-29 MED ORDER — LORAZEPAM 2 MG/ML IJ SOLN
1.0000 mg | Freq: Once | INTRAMUSCULAR | Status: AC
  Administered 2021-09-29: 1 mg via INTRAVENOUS
  Filled 2021-09-29: qty 1

## 2021-09-29 NOTE — ED Provider Notes (Signed)
Patient care assumed at 2330. Patient with history of recurrent alcohol intoxication here under IVC due to reports of homicidal and suicidal ideation. Patient has been observed in the emergency department. He is not homicidal or suicidal on reassessment. IVC was rescinded. Patient is requesting detox. Will refer to resources for outpatient detox. He is not an acute alcohol withdrawal on ED evaluation.   Tilden Fossa, MD 09/30/21 0700
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
## 2021-09-29 NOTE — ED Triage Notes (Signed)
Pt BIB [facility] from a [employer]. Patient states he drunk a lot of alcohol and is vocalizing SI.

## 2021-09-30 ENCOUNTER — Encounter (HOSPITAL_COMMUNITY): Payer: Self-pay

## 2021-09-30 ENCOUNTER — Emergency Department (HOSPITAL_COMMUNITY)
Admission: EM | Admit: 2021-09-30 | Discharge: 2021-09-30 | Disposition: A | Discharge status: Home / Self Care | Payer: Self-pay | Attending: Emergency Medicine | Admitting: Emergency Medicine

## 2021-09-30 DIAGNOSIS — F17210 Nicotine dependence, cigarettes, uncomplicated: Secondary | ICD-10-CM | POA: Insufficient documentation

## 2021-09-30 DIAGNOSIS — Y908 Blood alcohol level of 240 mg/100 ml or more: Secondary | ICD-10-CM | POA: Insufficient documentation

## 2021-09-30 DIAGNOSIS — Z21 Asymptomatic human immunodeficiency virus [HIV] infection status: Secondary | ICD-10-CM | POA: Insufficient documentation

## 2021-09-30 DIAGNOSIS — F1010 Alcohol abuse, uncomplicated: Secondary | ICD-10-CM | POA: Insufficient documentation

## 2021-09-30 LAB — CBC WITH DIFFERENTIAL/PLATELET
Abs Immature Granulocytes: 0.01 10*3/uL (ref 0.00–0.07)
Basophils Absolute: 0.1 10*3/uL (ref 0.0–0.1)
Basophils Relative: 1 %
Eosinophils Absolute: 0 10*3/uL (ref 0.0–0.5)
Eosinophils Relative: 1 %
HCT: 40.7 % (ref 39.0–52.0)
Hemoglobin: 13.8 g/dL (ref 13.0–17.0)
Immature Granulocytes: 0 %
Lymphocytes Relative: 28 %
Lymphs Abs: 1.5 10*3/uL (ref 0.7–4.0)
MCH: 33 pg (ref 26.0–34.0)
MCHC: 33.9 g/dL (ref 30.0–36.0)
MCV: 97.4 fL (ref 80.0–100.0)
Monocytes Absolute: 0.3 10*3/uL (ref 0.1–1.0)
Monocytes Relative: 6 %
Neutro Abs: 3.4 10*3/uL (ref 1.7–7.7)
Neutrophils Relative %: 64 %
Platelets: 82 10*3/uL — ABNORMAL LOW (ref 150–400)
RBC: 4.18 MIL/uL — ABNORMAL LOW (ref 4.22–5.81)
RDW: 14.6 % (ref 11.5–15.5)
Smear Review: NORMAL
WBC: 5.3 10*3/uL (ref 4.0–10.5)
nRBC: 0 % (ref 0.0–0.2)

## 2021-09-30 LAB — LIPASE, BLOOD: Lipase: 58 U/L — ABNORMAL HIGH (ref 11–51)

## 2021-09-30 LAB — COMPREHENSIVE METABOLIC PANEL
ALT: 46 U/L — ABNORMAL HIGH (ref 0–44)
AST: 66 U/L — ABNORMAL HIGH (ref 15–41)
Albumin: 4 g/dL (ref 3.5–5.0)
Alkaline Phosphatase: 57 U/L (ref 38–126)
Anion gap: 15 (ref 5–15)
BUN: 5 mg/dL — ABNORMAL LOW (ref 6–20)
CO2: 22 mmol/L (ref 22–32)
Calcium: 8.7 mg/dL — ABNORMAL LOW (ref 8.9–10.3)
Chloride: 102 mmol/L (ref 98–111)
Creatinine, Ser: 0.74 mg/dL (ref 0.61–1.24)
GFR, Estimated: >60 mL/min (ref >60)
Glucose, Bld: 73 mg/dL (ref 70–99)
Potassium: 3.7 mmol/L (ref 3.5–5.1)
Sodium: 139 mmol/L (ref 135–145)
Total Bilirubin: 0.7 mg/dL (ref 0.3–1.2)
Total Protein: 7 g/dL (ref 6.5–8.1)

## 2021-09-30 LAB — ETHANOL: Alcohol, Ethyl (B): 313 mg/dL (ref <10)

## 2021-09-30 LAB — CBG MONITORING, ED: Glucose-Capillary: 77 mg/dL (ref 70–99)

## 2021-09-30 MED ORDER — LORAZEPAM 1 MG PO TABS
1.0000 mg | ORAL_TABLET | Freq: Once | ORAL | Status: AC
  Administered 2021-09-30: 1 mg via ORAL
  Filled 2021-09-30: qty 1

## 2021-09-30 NOTE — ED Notes (Signed)
Prompted urine, pt states he does not need to void.
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
## 2021-09-30 NOTE — Discharge Instructions (Addendum)
Please use resource below to seek help for alcohol detox.

## 2021-09-30 NOTE — ED Triage Notes (Signed)
Pt arrives via [employer] for medical clearance r/t seizure. [employer] reports that initial contact with pt was after citizen called 911 for reported pt seizure. Pt denied transport but was taken into custody. Pt reports heavy alcohol intake, approximately a fifth of liquor per day. Last ETOH intake right before waking up to EMS per pt.

## 2021-09-30 NOTE — ED Notes (Signed)
Pt verbalized understanding of d/c instructions, meds and followup care. Denies questions. VSS, no distress noted. Steady gait to exit with all belongings in police custody.

## 2021-09-30 NOTE — ED Notes (Signed)
Pt given [place] sandwich and soda. Again prompted urine. Denies needs. No distress noted. VSS.

## 2021-11-05 ENCOUNTER — Inpatient Hospital Stay (HOSPITAL_COMMUNITY)
Admission: EM | Admit: 2021-11-05 | Discharge: 2021-11-08 | DRG: 894 | Discharge status: Against Medical Advice | Payer: Self-pay | Attending: Internal Medicine | Admitting: Internal Medicine

## 2021-11-05 DIAGNOSIS — D6489 Other specified anemias: Secondary | ICD-10-CM | POA: Diagnosis present

## 2021-11-05 DIAGNOSIS — Y908 Blood alcohol level of 240 mg/100 ml or more: Secondary | ICD-10-CM | POA: Diagnosis present

## 2021-11-05 DIAGNOSIS — G9349 Other encephalopathy: Secondary | ICD-10-CM | POA: Diagnosis present

## 2021-11-05 DIAGNOSIS — Z79899 Other long term (current) drug therapy: Secondary | ICD-10-CM

## 2021-11-05 DIAGNOSIS — E876 Hypokalemia: Secondary | ICD-10-CM | POA: Diagnosis present

## 2021-11-05 DIAGNOSIS — B20 Human immunodeficiency virus [HIV] disease: Secondary | ICD-10-CM | POA: Diagnosis present

## 2021-11-05 DIAGNOSIS — R0789 Other chest pain: Secondary | ICD-10-CM | POA: Diagnosis present

## 2021-11-05 DIAGNOSIS — F319 Bipolar disorder, unspecified: Secondary | ICD-10-CM | POA: Diagnosis present

## 2021-11-05 DIAGNOSIS — Z5901 Sheltered homelessness: Secondary | ICD-10-CM

## 2021-11-05 DIAGNOSIS — Z20822 Contact with and (suspected) exposure to covid-19: Secondary | ICD-10-CM | POA: Diagnosis present

## 2021-11-05 DIAGNOSIS — Z9114 Patient's other noncompliance with medication regimen: Secondary | ICD-10-CM

## 2021-11-05 DIAGNOSIS — R45850 Homicidal ideations: Secondary | ICD-10-CM | POA: Diagnosis present

## 2021-11-05 DIAGNOSIS — F10221 Alcohol dependence with intoxication delirium: Secondary | ICD-10-CM | POA: Diagnosis present

## 2021-11-05 DIAGNOSIS — D6959 Other secondary thrombocytopenia: Secondary | ICD-10-CM | POA: Diagnosis present

## 2021-11-05 DIAGNOSIS — F10939 Alcohol use, unspecified with withdrawal, unspecified: Secondary | ICD-10-CM | POA: Diagnosis present

## 2021-11-05 DIAGNOSIS — R45851 Suicidal ideations: Secondary | ICD-10-CM | POA: Diagnosis present

## 2021-11-05 DIAGNOSIS — Z811 Family history of alcohol abuse and dependence: Secondary | ICD-10-CM

## 2021-11-05 DIAGNOSIS — F10231 Alcohol dependence with withdrawal delirium: Principal | ICD-10-CM | POA: Diagnosis present

## 2021-11-05 DIAGNOSIS — F10920 Alcohol use, unspecified with intoxication, uncomplicated: Secondary | ICD-10-CM

## 2021-11-05 DIAGNOSIS — F1020 Alcohol dependence, uncomplicated: Secondary | ICD-10-CM

## 2021-11-05 DIAGNOSIS — Z6822 Body mass index (BMI) 22.0-22.9, adult: Secondary | ICD-10-CM

## 2021-11-05 DIAGNOSIS — K760 Fatty (change of) liver, not elsewhere classified: Secondary | ICD-10-CM | POA: Diagnosis present

## 2021-11-05 DIAGNOSIS — F17210 Nicotine dependence, cigarettes, uncomplicated: Secondary | ICD-10-CM | POA: Diagnosis present

## 2021-11-05 DIAGNOSIS — F10930 Alcohol use, unspecified with withdrawal, uncomplicated: Secondary | ICD-10-CM

## 2021-11-05 DIAGNOSIS — R64 Cachexia: Secondary | ICD-10-CM | POA: Diagnosis present
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
## 2021-11-05 NOTE — ED Provider Notes (Addendum)
[facility]-EMERGENCY DEPT Provider Note: Lowella Dell, MD, FACEP  CSN: [id number] MRN: [id number] ARRIVAL: 11/05/21 at 2238 ROOM: [facility]   CHIEF COMPLAINT  Suicidal   HISTORY OF PRESENT ILLNESS  11/05/21 11:24 PM Gary [name] is a 35 y.o. male with a history of alcoholism and HIV disease not currently taking any HAART.  He is here after being brought in from a homeless shelter by EMS.  He reports he needs detox and a mental hospital for suicidal and homicidal ideation.  He acknowledges not taking his medication.  He states this is due to mental breakdown he had earlier today.  He has abdominal pain which is chronic.   Past Medical History:  Diagnosis Date   Alcohol abuse    HIV (human immunodeficiency virus infection) (HCC)    Seizures (HCC)    Subdural hematoma     Past Surgical History:  Procedure Laterality Date   INCISION AND DRAINAGE PERIRECTAL ABSCESS N/A 09/24/2016   Procedure: IRRIGATION AND DEBRIDEMENT PERIRECTAL ABSCESS;  Surgeon: Ricarda Frame, MD;  Location: [facility] ORS;  Service: General;  Laterality: N/A;   none      Family History  Problem Relation Age of Onset   Alcohol abuse Mother    Alcohol abuse Father     Social History   Tobacco Use   Smoking status: Every Day    Packs/day: 0.50    Types: Cigarettes   Smokeless tobacco: Never   Tobacco comments:    unable to smoke while incarcerated 6+ months 02/13/20  Vaping Use   Vaping Use: Never used  Substance Use Topics   Alcohol use: Yes    Comment: "I drink all I can"   Drug use: Yes    Types: Methamphetamines, Marijuana    Comment: last used 04/10/2019    Prior to Admission medications   Medication Sig Start Date End Date Taking? Authorizing Provider  bictegravir-emtricitabine-tenofovir AF (BIKTARVY) 50-200-25 MG TABS tablet Take 1 tablet by mouth daily. Patient not taking: Reported on 09/22/2021 08/31/21   Ardis Hughs, NP  chlordiazePOXIDE (LIBRIUM) 25 MG capsule 50mg  PO TID x 1D,  then 25-50mg  PO BID X 1D, then 25-50mg  PO QD X 1D Patient not taking: Reported on 09/22/2021 09/06/21   09/08/21, PA-C  famotidine (PEPCID) 20 MG tablet Take 1 tablet (20 mg total) by mouth 2 (two) times daily. Patient not taking: Reported on 06/01/2019 05/14/19 06/02/19  06/04/19, MD    Allergies Tegretol [carbamazepine] and Caffeine   REVIEW OF SYSTEMS  Negative except as noted here or in the History of Present Illness.   PHYSICAL EXAMINATION  Initial Vital Signs Blood pressure 119/82, pulse 98, temperature 97.7 F (36.5 C), temperature source Oral, resp. rate 16, SpO2 98 %.  Examination General: Well-developed, well-nourished male in no acute distress; appearance consistent with age of record HENT: normocephalic; atraumatic Eyes: Normal appearance Neck: supple Heart: regular rate and rhythm Lungs: clear to auscultation bilaterally Abdomen: soft; nondistended; type loosely tender; bowel sounds present Extremities: No deformity; full range of motion Neurologic: Somnolent but arousable; dysarthria; noted to move all extremities Skin: Warm and dry Psychiatric: Flat affect; suicidal ideation; homicidal ideation   RESULTS  Summary of this visit's results, reviewed and interpreted by myself:   EKG Interpretation  Date/Time:    Ventricular Rate:    PR Interval:    QRS Duration:   QT Interval:    QTC Calculation:   R Axis:     Text Interpretation:  Laboratory Studies: Results for orders placed or performed during the hospital encounter of 11/05/21 (from the past 24 hour(s))  Rapid urine drug screen (hospital performed)     Status: None   Collection Time: 11/05/21 10:41 PM  Result Value Ref Range   Opiates NONE DETECTED NONE DETECTED   Cocaine NONE DETECTED NONE DETECTED   Benzodiazepines NONE DETECTED NONE DETECTED   Amphetamines NONE DETECTED NONE DETECTED   Tetrahydrocannabinol NONE DETECTED NONE DETECTED   Barbiturates NONE DETECTED NONE  DETECTED  Comprehensive metabolic panel     Status: Abnormal   Collection Time: 11/06/21 12:54 AM  Result Value Ref Range   Sodium 139 135 - 145 mmol/L   Potassium 3.7 3.5 - 5.1 mmol/L   Chloride 102 98 - 111 mmol/L   CO2 24 22 - 32 mmol/L   Glucose, Bld 95 70 - 99 mg/dL   BUN 8 6 - 20 mg/dL   Creatinine, Ser 2.40 0.61 - 1.24 mg/dL   Calcium 8.9 8.9 - 97.3 mg/dL   Total Protein 8.4 (H) 6.5 - 8.1 g/dL   Albumin 4.3 3.5 - 5.0 g/dL   AST 61 (H) 15 - 41 U/L   ALT 34 0 - 44 U/L   Alkaline Phosphatase 93 38 - 126 U/L   Total Bilirubin 0.7 0.3 - 1.2 mg/dL   GFR, Estimated >53 >29 mL/min   Anion gap 13 5 - 15  Ethanol     Status: Abnormal   Collection Time: 11/06/21 12:54 AM  Result Value Ref Range   Alcohol, Ethyl (B) 458 (HH) <10 mg/dL  Salicylate level     Status: Abnormal   Collection Time: 11/06/21 12:54 AM  Result Value Ref Range   Salicylate Lvl <7.0 (L) 7.0 - 30.0 mg/dL  Acetaminophen level     Status: Abnormal   Collection Time: 11/06/21 12:54 AM  Result Value Ref Range   Acetaminophen (Tylenol), Serum <10 (L) 10 - 30 ug/mL  CBC with Differential/Platelet     Status: Abnormal   Collection Time: 11/06/21 12:54 AM  Result Value Ref Range   WBC 6.8 4.0 - 10.5 K/uL   RBC 4.13 (L) 4.22 - 5.81 MIL/uL   Hemoglobin 13.9 13.0 - 17.0 g/dL   HCT 92.4 26.8 - 34.1 %   MCV 97.6 80.0 - 100.0 fL   MCH 33.7 26.0 - 34.0 pg   MCHC 34.5 30.0 - 36.0 g/dL   RDW 96.2 (H) 22.9 - 79.8 %   Platelets 101 (L) 150 - 400 K/uL   nRBC 0.0 0.0 - 0.2 %   Neutrophils Relative % 54 %   Neutro Abs 3.7 1.7 - 7.7 K/uL   Lymphocytes Relative 35 %   Lymphs Abs 2.4 0.7 - 4.0 K/uL   Monocytes Relative 7 %   Monocytes Absolute 0.5 0.1 - 1.0 K/uL   Eosinophils Relative 3 %   Eosinophils Absolute 0.2 0.0 - 0.5 K/uL   Basophils Relative 1 %   Basophils Absolute 0.1 0.0 - 0.1 K/uL   Immature Granulocytes 0 %   Abs Immature Granulocytes 0.02 0.00 - 0.07 K/uL  Resp Panel by RT-PCR (Flu A&B, Covid)  Nasopharyngeal Swab     Status: None   Collection Time: 11/06/21  2:00 AM   Specimen: Nasopharyngeal Swab; Nasopharyngeal(NP) swabs in vial transport medium  Result Value Ref Range   SARS Coronavirus 2 by RT PCR NEGATIVE NEGATIVE   Influenza A by PCR NEGATIVE NEGATIVE   Influenza B by PCR NEGATIVE NEGATIVE  Imaging Studies: No results found.  ED COURSE and MDM  Nursing notes, initial and subsequent vitals signs, including pulse oximetry, reviewed and interpreted by myself.  Vitals:   11/05/21 2247 11/05/21 2300 11/06/21 0036  BP:  119/82 119/82  Pulse:  98 98  Resp:  16 20  Temp:  97.7 F (36.5 C) 98 F (36.7 C)  TempSrc:  Oral   SpO2: 98% 98% 99%   Medications  LORazepam (ATIVAN) injection 0-4 mg (has no administration in time range)    Or  LORazepam (ATIVAN) tablet 0-4 mg (has no administration in time range)  thiamine tablet 100 mg (has no administration in time range)    Or  thiamine (B-1) injection 100 mg (has no administration in time range)  nicotine (NICODERM CQ - dosed in mg/24 hours) patch 21 mg (has no administration in time range)    Placed in psychiatric hold.  TTS consulted.  PROCEDURES  Procedures   ED DIAGNOSES     ICD-10-CM   1. Suicidal ideation  R45.851     2. Homicidal ideation  R45.850     3. Alcoholic intoxication without complication (HCC)  F10.920          Timotheus Salm, Jonny Ruiz, MD 11/06/21 0200    Paula Libra, MD 11/06/21 9509

## 2021-11-06 ENCOUNTER — Other Ambulatory Visit: Payer: Self-pay

## 2021-11-06 ENCOUNTER — Encounter (HOSPITAL_COMMUNITY): Payer: Self-pay | Admitting: Internal Medicine

## 2021-11-06 DIAGNOSIS — F10939 Alcohol use, unspecified with withdrawal, unspecified: Secondary | ICD-10-CM | POA: Diagnosis present

## 2021-11-06 DIAGNOSIS — F10931 Alcohol use, unspecified with withdrawal delirium: Secondary | ICD-10-CM

## 2021-11-06 LAB — COMPREHENSIVE METABOLIC PANEL
ALT: 34 U/L (ref 0–44)
AST: 61 U/L — ABNORMAL HIGH (ref 15–41)
Albumin: 4.3 g/dL (ref 3.5–5.0)
Alkaline Phosphatase: 93 U/L (ref 38–126)
Anion gap: 13 (ref 5–15)
BUN: 8 mg/dL (ref 6–20)
CO2: 24 mmol/L (ref 22–32)
Calcium: 8.9 mg/dL (ref 8.9–10.3)
Chloride: 102 mmol/L (ref 98–111)
Creatinine, Ser: 0.67 mg/dL (ref 0.61–1.24)
GFR, Estimated: >60 mL/min (ref >60)
Glucose, Bld: 95 mg/dL (ref 70–99)
Potassium: 3.7 mmol/L (ref 3.5–5.1)
Sodium: 139 mmol/L (ref 135–145)
Total Bilirubin: 0.7 mg/dL (ref 0.3–1.2)
Total Protein: 8.4 g/dL — ABNORMAL HIGH (ref 6.5–8.1)

## 2021-11-06 LAB — TROPONIN I (HIGH SENSITIVITY)
Troponin I (High Sensitivity): 8 ng/L (ref <18)
Troponin I (High Sensitivity): 8 ng/L (ref <18)

## 2021-11-06 LAB — RESP PANEL BY RT-PCR (FLU A&B, COVID) ARPGX2
Influenza A by PCR: NEGATIVE
Influenza B by PCR: NEGATIVE
SARS Coronavirus 2 by RT PCR: NEGATIVE

## 2021-11-06 LAB — CREATININE, SERUM
Creatinine, Ser: 0.64 mg/dL (ref 0.61–1.24)
GFR, Estimated: >60 mL/min (ref >60)

## 2021-11-06 LAB — CBC
HCT: 35.9 % — ABNORMAL LOW (ref 39.0–52.0)
Hemoglobin: 12.5 g/dL — ABNORMAL LOW (ref 13.0–17.0)
MCH: 33.8 pg (ref 26.0–34.0)
MCHC: 34.8 g/dL (ref 30.0–36.0)
MCV: 97 fL (ref 80.0–100.0)
Platelets: 72 10*3/uL — ABNORMAL LOW (ref 150–400)
RBC: 3.7 MIL/uL — ABNORMAL LOW (ref 4.22–5.81)
RDW: 15.9 % — ABNORMAL HIGH (ref 11.5–15.5)
WBC: 3.8 10*3/uL — ABNORMAL LOW (ref 4.0–10.5)
nRBC: 0 % (ref 0.0–0.2)

## 2021-11-06 LAB — SALICYLATE LEVEL: Salicylate Lvl: <7 mg/dL — ABNORMAL LOW (ref 7.0–30.0)

## 2021-11-06 LAB — CBC WITH DIFFERENTIAL/PLATELET
Abs Immature Granulocytes: 0.02 10*3/uL (ref 0.00–0.07)
Basophils Absolute: 0.1 10*3/uL (ref 0.0–0.1)
Basophils Relative: 1 %
Eosinophils Absolute: 0.2 10*3/uL (ref 0.0–0.5)
Eosinophils Relative: 3 %
HCT: 40.3 % (ref 39.0–52.0)
Hemoglobin: 13.9 g/dL (ref 13.0–17.0)
Immature Granulocytes: 0 %
Lymphocytes Relative: 35 %
Lymphs Abs: 2.4 10*3/uL (ref 0.7–4.0)
MCH: 33.7 pg (ref 26.0–34.0)
MCHC: 34.5 g/dL (ref 30.0–36.0)
MCV: 97.6 fL (ref 80.0–100.0)
Monocytes Absolute: 0.5 10*3/uL (ref 0.1–1.0)
Monocytes Relative: 7 %
Neutro Abs: 3.7 10*3/uL (ref 1.7–7.7)
Neutrophils Relative %: 54 %
Platelets: 101 10*3/uL — ABNORMAL LOW (ref 150–400)
RBC: 4.13 MIL/uL — ABNORMAL LOW (ref 4.22–5.81)
RDW: 15.9 % — ABNORMAL HIGH (ref 11.5–15.5)
WBC: 6.8 10*3/uL (ref 4.0–10.5)
nRBC: 0 % (ref 0.0–0.2)

## 2021-11-06 LAB — ETHANOL: Alcohol, Ethyl (B): 458 mg/dL (ref <10)

## 2021-11-06 LAB — RAPID URINE DRUG SCREEN, HOSP PERFORMED
Amphetamines: NOT DETECTED
Barbiturates: NOT DETECTED
Benzodiazepines: NOT DETECTED
Cocaine: NOT DETECTED
Opiates: NOT DETECTED
Tetrahydrocannabinol: NOT DETECTED

## 2021-11-06 LAB — ACETAMINOPHEN LEVEL: Acetaminophen (Tylenol), Serum: <10 ug/mL — ABNORMAL LOW (ref 10–30)

## 2021-11-06 LAB — PROCALCITONIN: Procalcitonin: <0.1 ng/mL

## 2021-11-06 MED ORDER — ENOXAPARIN SODIUM 40 MG/0.4ML IJ SOSY
40.0000 mg | PREFILLED_SYRINGE | INTRAMUSCULAR | Status: DC
  Administered 2021-11-06 – 2021-11-07 (×2): 40 mg via SUBCUTANEOUS
  Filled 2021-11-06: qty 0.4

## 2021-11-06 MED ORDER — PHENOBARBITAL SODIUM 130 MG/ML IJ SOLN
130.0000 mg | Freq: Once | INTRAMUSCULAR | Status: AC
  Administered 2021-11-06: 14:00:00 130 mg via INTRAVENOUS
  Filled 2021-11-06: qty 1

## 2021-11-06 MED ORDER — LORAZEPAM 2 MG/ML IJ SOLN: 0.0000 mg | Freq: Two times a day (BID) | INTRAMUSCULAR | Status: DC

## 2021-11-06 MED ORDER — NICOTINE 21 MG/24HR TD PT24
21.0000 mg | MEDICATED_PATCH | Freq: Every day | TRANSDERMAL | Status: DC
  Administered 2021-11-06 – 2021-11-08 (×2): 21 mg via TRANSDERMAL
  Filled 2021-11-06 (×4): qty 1

## 2021-11-06 MED ORDER — CHLORHEXIDINE GLUCONATE CLOTH 2 % EX PADS
6.0000 | MEDICATED_PAD | Freq: Every day | CUTANEOUS | Status: DC
  Administered 2021-11-07 (×2): 6 via TOPICAL

## 2021-11-06 MED ORDER — THIAMINE HCL 100 MG/ML IJ SOLN: 100.0000 mg | Freq: Every day | INTRAMUSCULAR | Status: DC

## 2021-11-06 MED ORDER — CLONIDINE HCL 0.1 MG PO TABS
0.1000 mg | ORAL_TABLET | Freq: Four times a day (QID) | ORAL | Status: DC
  Administered 2021-11-06 – 2021-11-08 (×7): 0.1 mg via ORAL
  Filled 2021-11-06 (×7): qty 1

## 2021-11-06 MED ORDER — DEXMEDETOMIDINE HCL IN NACL 400 MCG/100ML IV SOLN
0.4000 ug/kg/h | INTRAVENOUS | Status: DC
  Filled 2021-11-06: qty 100

## 2021-11-06 MED ORDER — CLONIDINE HCL 0.1 MG PO TABS: 0.1000 mg | ORAL_TABLET | Freq: Every day | ORAL | Status: DC

## 2021-11-06 MED ORDER — ONDANSETRON 4 MG PO TBDP: 4.0000 mg | ORAL_TABLET | Freq: Four times a day (QID) | ORAL | Status: DC | PRN

## 2021-11-06 MED ORDER — SODIUM CHLORIDE 0.9 % IV SOLN
260.0000 mg | Freq: Once | INTRAVENOUS | Status: AC
  Administered 2021-11-06: 12:00:00 260 mg via INTRAVENOUS
  Filled 2021-11-06: qty 2

## 2021-11-06 MED ORDER — NAPROXEN 500 MG PO TABS: 500.0000 mg | ORAL_TABLET | Freq: Two times a day (BID) | ORAL | Status: DC | PRN

## 2021-11-06 MED ORDER — ONDANSETRON HCL 4 MG/2ML IJ SOLN: 4.0000 mg | Freq: Four times a day (QID) | INTRAMUSCULAR | Status: DC | PRN

## 2021-11-06 MED ORDER — LOPERAMIDE HCL 2 MG PO CAPS: 2.0000 mg | ORAL_CAPSULE | ORAL | Status: DC | PRN

## 2021-11-06 MED ORDER — METHOCARBAMOL 500 MG PO TABS
500.0000 mg | ORAL_TABLET | Freq: Three times a day (TID) | ORAL | Status: DC | PRN
  Administered 2021-11-06: 22:00:00 500 mg via ORAL
  Filled 2021-11-06: qty 1

## 2021-11-06 MED ORDER — DOCUSATE SODIUM 100 MG PO CAPS: 100.0000 mg | ORAL_CAPSULE | Freq: Two times a day (BID) | ORAL | Status: DC | PRN

## 2021-11-06 MED ORDER — DICYCLOMINE HCL 20 MG PO TABS
20.0000 mg | ORAL_TABLET | Freq: Four times a day (QID) | ORAL | Status: DC | PRN
  Filled 2021-11-06: qty 1

## 2021-11-06 MED ORDER — THIAMINE HCL 100 MG PO TABS
100.0000 mg | ORAL_TABLET | Freq: Every day | ORAL | Status: DC
  Administered 2021-11-06 – 2021-11-08 (×3): 100 mg via ORAL
  Filled 2021-11-06 (×3): qty 1

## 2021-11-06 MED ORDER — LORAZEPAM 1 MG PO TABS: 0.0000 mg | ORAL_TABLET | Freq: Two times a day (BID) | ORAL | Status: DC

## 2021-11-06 MED ORDER — LORAZEPAM 1 MG PO TABS
0.0000 mg | ORAL_TABLET | Freq: Two times a day (BID) | ORAL | Status: DC
  Administered 2021-11-06: 10:00:00 2 mg via ORAL
  Filled 2021-11-06: qty 2

## 2021-11-06 MED ORDER — HYDROXYZINE HCL 25 MG PO TABS
25.0000 mg | ORAL_TABLET | Freq: Four times a day (QID) | ORAL | Status: DC | PRN
  Administered 2021-11-06 – 2021-11-07 (×3): 25 mg via ORAL
  Filled 2021-11-06 (×3): qty 1

## 2021-11-06 MED ORDER — CLONIDINE HCL 0.1 MG PO TABS: 0.1000 mg | ORAL_TABLET | ORAL | Status: DC

## 2021-11-06 MED ORDER — PANTOPRAZOLE SODIUM 40 MG IV SOLR
40.0000 mg | Freq: Every day | INTRAVENOUS | Status: DC
  Administered 2021-11-06 – 2021-11-07 (×2): 40 mg via INTRAVENOUS
  Filled 2021-11-06 (×2): qty 40

## 2021-11-06 MED ORDER — DEXTROSE IN LACTATED RINGERS 5 % IV SOLN: INTRAVENOUS | Status: DC

## 2021-11-06 MED ORDER — POLYETHYLENE GLYCOL 3350 17 G PO PACK: 17.0000 g | PACK | Freq: Every day | ORAL | Status: DC | PRN

## 2021-11-06 MED ORDER — ACETAMINOPHEN 325 MG PO TABS
650.0000 mg | ORAL_TABLET | Freq: Four times a day (QID) | ORAL | Status: DC | PRN
  Administered 2021-11-06: 17:00:00 650 mg via ORAL
  Filled 2021-11-06: qty 2

## 2021-11-06 MED ORDER — LORAZEPAM 2 MG/ML IJ SOLN
1.0000 mg | INTRAMUSCULAR | Status: DC | PRN
  Administered 2021-11-07: 10:00:00 1 mg via INTRAVENOUS
  Administered 2021-11-07 – 2021-11-08 (×2): 2 mg via INTRAVENOUS
  Administered 2021-11-08: 1 mg via INTRAVENOUS
  Administered 2021-11-08: 2 mg via INTRAVENOUS
  Administered 2021-11-08 (×2): 1 mg via INTRAVENOUS
  Administered 2021-11-08: 2 mg via INTRAVENOUS
  Filled 2021-11-06 (×8): qty 1

## 2021-11-06 MED ORDER — ORAL CARE MOUTH RINSE
15.0000 mL | Freq: Two times a day (BID) | OROMUCOSAL | Status: DC
  Administered 2021-11-06 – 2021-11-08 (×4): 15 mL via OROMUCOSAL

## 2021-11-06 MED ORDER — DEXMEDETOMIDINE HCL IN NACL 400 MCG/100ML IV SOLN
0.2000 ug/kg/h | INTRAVENOUS | Status: DC
  Administered 2021-11-07 (×3): 0.7 ug/kg/h via INTRAVENOUS
  Administered 2021-11-08: 0.1 ug/kg/h via INTRAVENOUS
  Filled 2021-11-06 (×5): qty 100

## 2021-11-06 NOTE — ED Notes (Signed)
Pharmacy called regarding pt's Precedex drip. Pharmacy to send Precedex.

## 2021-11-06 NOTE — ED Notes (Signed)
Pharmacy notified of pt's weight for Precedex dosing at this time.

## 2021-11-06 NOTE — ED Provider Notes (Addendum)
Emergency Medicine Observation Re-evaluation Note  Gary [name] is a 35 y.o. male, seen on rounds today.  Pt initially presented to the ED for complaints of Suicidal Currently, the patient is agitated, CIWA 14.  I evaluated the patient bedside.  The patient appears to be in acute alcohol withdrawal, he is tachycardic and tremulous and mildly agitated.  The patient states that he drinks several hard alcoholic beverages daily and frequently will go into withdrawal he does not have a drink within 24 hours.  He is interested in quitting.  He currently denies any SI, HI or AVH.  He is primarily interested and stopping drinking.  Physical Exam  BP 120/78    Pulse 96    Temp 98.3 F (36.8 C) (Oral)    Resp 16    SpO2 96%  Physical Exam General: agitated, uncomfortable appearing, tremulous Cardiac: tachycardic Lungs: even and unlabored Psych: No IS, HI, AVH  .Critical Care Performed by: Ernie Avena, MD Authorized by: Ernie Avena, MD   Critical care provider statement:    Critical care time (minutes):  74   Critical care was necessary to treat or prevent imminent or life-threatening deterioration of the following conditions: EtOH withdrawal.   Critical care was time spent personally by me on the following activities:  Development of treatment plan with patient or surrogate, examination of patient, obtaining history from patient or surrogate, ordering and performing treatments and interventions, ordering and review of laboratory studies, ordering and review of radiographic studies, pulse oximetry, re-evaluation of patient's condition and review of old charts   Care discussed with: admitting provider     ED Course / MDM  EKG:   I have reviewed the labs performed to date as well as medications administered while in observation.  Recent changes in the last 24 hours include patient presented to the ED with SI, HI, now recanted. Patient now with CIWA 14, requiring multiple rounds of  benzodiazepines. Will administer a Phenobarbital load and continue to monitor. Hospitalist medicine initially consulted for admission however, the patient had an uptrending CIWA score from 14->16 despite interventions with benzodiazepine and initial phenobarbital load.  Discussed the patient's care with critical care management who recommended initiating a Precedex gtt. which was started.  The patient was subsequently admitted to critical care.  Plan  Current plan is for medical admission for ETOH withdrawal. Pt cleared psychiatrically  Gary [name] is not under involuntary commitment.     Ernie Avena, MD 11/06/21 1131    Ernie Avena, MD 11/06/21 1345     Ernie Avena, MD 11/06/21 1650

## 2021-11-06 NOTE — Progress Notes (Signed)
eLink Physician-Brief Progress Note Patient Name: Gary [name] DOB: 25-Dec-1985 MRN: [id number]   Date of Service  11/06/2021  HPI/Events of Note  ETOH withdrawal  eICU Interventions  Appears calm on precedex gtt Maintaining BP & sat     Intervention Category Evaluation Type: New Patient Evaluation  Gary [name] V. Danee Soller 11/06/2021, 9:45 PM

## 2021-11-06 NOTE — H&P (Signed)
NAME:  Gary [name], MRN:  [id number], DOB:  Mar 19, 1986, LOS: 0 ADMISSION DATE:  11/05/2021, CONSULTATION DATE:  11/06/2021 REFERRING MD:  Dr. Karene Fry, CHIEF COMPLAINT: EtOH withdrawal  History of Present Illness:  Gary [name] is a 35 year old male with a past medical history significant for HIV, bipolar disease, EtOH abuse, subdural hematoma, and seizures who presented to the emergency department after being picked up at the local homeless shelter with reports of needing "detox and mental hospital".  On ED arrival patient endorsed both suicidal and homicidal ideations.  On ED arrival patient was seen hemodynamically stable.  Lab work also relatively unremarkable apart from mild elevation in AST.  Alcohol level 458 but negative urine tox screen.  While in the emergency department patient appears to be cleared by psychiatry but later developed alcohol withdrawal symptoms with elevated CIWA of 14.  He has symptoms received benzodiazepine and phenobarbital load but withdrawal persisted and Precedex drip was initiated.  PCCM consulted for further management and admission for  Pertinent  Medical History  HIV Bipolar disease EtOH abuse Subdural hematoma Seizure  Significant Hospital Events: Including procedures, antibiotic start and stop dates in addition to other pertinent events   12/28 presented to the ED for request of alcohol detox and suicidal/homicidal ideations. 12/29 cleared from a psychiatric standpoint but developed alcohol withdrawal symptoms requiring benzodiazepine and phenobarbital load later followed by Precedex drip  Interim History / Subjective:  Seen lying on ED stretcher with complaints of headache and central pressure like chest pain  Report cough with green sputum   Objective   Blood pressure 130/83, pulse (!) 110, temperature 99.6 F (37.6 C), temperature source Oral, resp. rate 18, SpO2 92 %.       No intake or output data in the 24 hours ending 11/06/21  1453 There were no vitals filed for this visit.  Examination: General: Acute on chronic ill appearing adult male lying in bed on mechanical ventilation, in NAD HEENT: [place]/AT, MM pink/moist, PERRL,  Neuro: Alert and oriented x3, non-focal  CV: s1s2 regular rate and rhythm, no murmur, rubs, or gallops,  PULM:  Clear to ascultation bilaterally, no increased work of breathing  GI: soft, bowel sounds active in all 4 quadrants, non-tender, non-distended, tolerating oral diet  Extremities: warm/dry, no edema  Skin: no rashes or lesions  Resolved Hospital Problem list     Assessment & Plan:  EtOH withdrawal with daily alcohol consumption -Patient reports drinking several hard alcoholic drinks daily with frequent episodes of withdrawal -EtOH level  Elevated AST P: Precedex drip As needed benzodiazepine CIWA scale Supplement thiamine, folate, multivitamin Seizure precautions Check hepatitis panel  HIV -Per chart review it appears patient is not currently on [place] therapy P: Check viral load Consider ID consult Point-of-care  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 P: Maintain neuro protective measures; goal for eurothermia, euglycemia, eunatermia, normoxia, and PCO2 goal of 35-40 Nutrition and bowel regiment  Seizure precautions  Aspirations precautions   Chest pain -Patient reports central pressure like chest pain on ED assessment  P: Check EKG and cycle troponin  Optimize electrolytes  Chest ECHO   Best Practice (right click and "Reselect all SmartList Selections" daily)   Diet/type: NPO DVT prophylaxis: systemic heparin GI prophylaxis: PPI Lines: N/A Foley:  N/A Code Status:  full code Last date of multidisciplinary goals of care discussion pending   Labs   CBC: Recent Labs  Lab 11/06/21 0054  WBC 6.8  NEUTROABS 3.7  HGB 13.9  HCT 40.3  MCV 97.6  PLT 101*    Basic Metabolic Panel: Recent Labs  Lab 11/06/21 0054   NA 139  K 3.7  CL 102  CO2 24  GLUCOSE 95  BUN 8  CREATININE 0.67  CALCIUM 8.9   GFR: CrCl cannot be calculated (Unknown ideal weight.). Recent Labs  Lab 11/06/21 0054  WBC 6.8    Liver Function Tests: Recent Labs  Lab 11/06/21 0054  AST 61*  ALT 34  ALKPHOS 93  BILITOT 0.7  PROT 8.4*  ALBUMIN 4.3   No results for input(s): LIPASE, AMYLASE in the last 168 hours. No results for input(s): AMMONIA in the last 168 hours.  ABG    Component Value Date/Time   HCO3 27.1 02/12/2021 0056   TCO2 23 04/29/2021 2105   O2SAT 100.0 02/12/2021 0056     Coagulation Profile: No results for input(s): INR, PROTIME in the last 168 hours.  Cardiac Enzymes: No results for input(s): CKTOTAL, CKMB, CKMBINDEX, TROPONINI in the last 168 hours.  HbA1C: Hgb A1c MFr Bld  Date/Time Value Ref Range Status  07/26/2021 06:53 AM 4.3 (L) 4.8 - 5.6 % Final    Comment:    (NOTE) Pre diabetes:          5.7%-6.4%  Diabetes:              >6.4%  Glycemic control for   <7.0% adults with diabetes   03/12/2021 03:57 AM 5.1 4.8 - 5.6 % Final    Comment:    (NOTE) Pre diabetes:          5.7%-6.4%  Diabetes:              >6.4%  Glycemic control for   <7.0% adults with diabetes     CBG: No results for input(s): GLUCAP in the last 168 hours.  Review of Systems:   Unable to assess secondary to altered mental status  Past Medical History:  He,  has a past medical history of Alcohol abuse, HIV (human immunodeficiency virus infection) (HCC), Seizures (HCC), and Subdural hematoma.   Surgical History:   Past Surgical History:  Procedure Laterality Date   INCISION AND DRAINAGE PERIRECTAL ABSCESS N/A 09/24/2016   Procedure: IRRIGATION AND DEBRIDEMENT PERIRECTAL ABSCESS;  Surgeon: Ricarda Frame, MD;  Location: [facility] ORS;  Service: General;  Laterality: N/A;   none       Social History:   reports that he has been smoking cigarettes. He has been smoking an average of .5 packs per day.  He has never used smokeless tobacco. He reports current alcohol use. He reports current drug use. Drugs: Methamphetamines and Marijuana.   Family History:  His family history includes Alcohol abuse in his father and mother.   Allergies Allergies  Allergen Reactions   Tegretol [Carbamazepine] Other (See Comments)    Caused vertigo for 2 days after taking it   Caffeine Palpitations     Home Medications  Prior to Admission medications   Medication Sig Start Date End Date Taking? Authorizing Provider  bictegravir-emtricitabine-tenofovir AF (BIKTARVY) 50-200-25 MG TABS tablet Take 1 tablet by mouth daily. Patient not taking: Reported on 09/22/2021 08/31/21   Ardis Hughs, NP  chlordiazePOXIDE (LIBRIUM) 25 MG capsule 50mg  PO TID x 1D, then 25-50mg  PO BID X 1D, then 25-50mg  PO QD X 1D Patient not taking: Reported on 09/22/2021 09/06/21   Theron Arista, PA-C  famotidine (PEPCID) 20 MG tablet Take 1 tablet (20 mg total) by mouth  2 (two) times daily. Patient not taking: Reported on 06/01/2019 05/14/19 06/02/19  Benjiman Core, MD     Critical care time:     Performed by: Mark Benecke D. Harris   Total critical care time: 45 minutes  Critical care time was exclusive of separately billable procedures and treating other patients.  Critical care was necessary to treat or prevent imminent or life-threatening deterioration.  Critical care was time spent personally by me on the following activities: development of treatment plan with patient and/or surrogate as well as nursing, discussions with consultants, evaluation of patient's response to treatment, examination of patient, obtaining history from patient or surrogate, ordering and performing treatments and interventions, ordering and review of laboratory studies, ordering and review of radiographic studies, pulse oximetry and re-evaluation of patient's condition.  Sebrina Kessner D. Tiburcio Pea, NP-C  Pulmonary & Critical Care Personal contact  information can be found on Amion  11/06/2021, 3:03 PM

## 2021-11-06 NOTE — ED Notes (Addendum)
EDP notified of pt's CIWA score of 16. Phenobarbital given as ordered. Per EDP, holding Ativan at this time.

## 2021-11-06 NOTE — BH Assessment (Signed)
Per Azucena Cecil, RN: "well my guess is we made need to wait a few more hours. He wakes up, but goes right back to sleep."   Clinician expressed we can try when the pt is more alert and to let TTS know when the pt is able to engage.   Redmond Pulling, MS, [facility], [facility] Triage Specialist [phone]

## 2021-11-06 NOTE — BH Assessment (Signed)
Clinician messaged Junior Rimando, RN "Hey. It's Trey with TTS. Is the pt able to engage in assessment, if so the pt needs to be placed in a private room? Is the pt under IVC?"   Clinician awaiting response.    Redmond Pulling, MS, [facility], [facility] Triage Specialist [phone]
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
## 2021-11-06 NOTE — ED Notes (Signed)
Messaged Hospitalist, Dr. Marchelle Gearing regarding pt c/o "still feeling awful" despite being maxed dosed on Precedex drip. Ramaswamy at the bedside. Will await further orders.

## 2021-11-06 NOTE — Discharge Instructions (Signed)
To help you maintain a sober lifestyle, a substance use disorder treatment program may be beneficial to you.  Contact one of the following providers at your earliest opportunity to ask about enrolling their program:  RESIDENTIAL REHAB:       [facility]      [address] [place], [place] [address]      [phone]       [facility]      [address]      [place], [place] [address]      [phone]       [facility]      [address]      [place], [place] [address]      [phone]       [facility]      [address]      [place], [place] [address]      [phone]  LONG TERM CHARITABLE PROGRAMS:       [facility] [place]      [address], [place] [address]      [phone]      This program has a one-time application fee       [address]      [address], [place] [address]      [phone]       [facility]      [phone] [address] [place] [address]      [phone]      [place], [place] [address]      [phone], [place] [address]      [phone]       [name]      [address] [place], [place] [address]      [phone]

## 2021-11-06 NOTE — ED Notes (Signed)
Janeann Forehand, NP, notified of pt's oral temp of 100.52F. Tylenol ordered at this time. VS are otherwise stable.

## 2021-11-06 NOTE — Progress Notes (Signed)
Arrived to ICU-Rm 34  from ED. Report was received. Alert to person/month/year and place. Precedex @ 0.7 mcg/kg/hr infusing in 20 LFA. Has second IV 20 G RAC c/d/I , flushed and SL. Denies pain, dizziness, shortness of breath. States he came to the hospital b/c he wants to detox from Alcohol and that his last drink was yesterday during the day and he was drinking vodka. He states he drinks 2 fifths of vodka on a normal drinking day. VS stable. He is calm/cooperative and answers questions appropriately. Fatigued. Sitter is at bedside. Denies needs at this time. Continuing to monitor and 1:1 sitter remains for safety at this time.

## 2021-11-06 NOTE — BH Assessment (Signed)
[facility] Assessment Progress Note   Per EDP Ernie Avena, MD this voluntary patient is to be medically admitted.  However, pt was seen by TTS and and staffed with Elta Guadeloupe, NP, who finds that pt does not require psychiatric hospitalization at this time.  Per the request of Melynda Ripple, Counselor, this writer has entered referral information for area substance use disorder treatment program into pt's AVS for follow up at pt's initiative.  Dr Karene Fry and pt's nurse, Eileen Stanford, have been notified.  Doylene Canning, [place] Behavioral Health Coordinator [phone]

## 2021-11-06 NOTE — ED Notes (Signed)
Precedx drip titrated from 0.34mcg/kg/hr to 0.64mcg/kg/hr at this time.

## 2021-11-07 ENCOUNTER — Inpatient Hospital Stay (HOSPITAL_COMMUNITY): Payer: Self-pay

## 2021-11-07 DIAGNOSIS — R079 Chest pain, unspecified: Secondary | ICD-10-CM

## 2021-11-07 LAB — BASIC METABOLIC PANEL
Anion gap: 8 (ref 5–15)
BUN: 10 mg/dL (ref 6–20)
CO2: 25 mmol/L (ref 22–32)
Calcium: 8.7 mg/dL — ABNORMAL LOW (ref 8.9–10.3)
Chloride: 100 mmol/L (ref 98–111)
Creatinine, Ser: 0.71 mg/dL (ref 0.61–1.24)
GFR, Estimated: >60 mL/min (ref >60)
Glucose, Bld: 107 mg/dL — ABNORMAL HIGH (ref 70–99)
Potassium: 3.4 mmol/L — ABNORMAL LOW (ref 3.5–5.1)
Sodium: 133 mmol/L — ABNORMAL LOW (ref 135–145)

## 2021-11-07 LAB — MAGNESIUM: Magnesium: 1.7 mg/dL (ref 1.7–2.4)

## 2021-11-07 LAB — CBC
HCT: 33.3 % — ABNORMAL LOW (ref 39.0–52.0)
Hemoglobin: 11.6 g/dL — ABNORMAL LOW (ref 13.0–17.0)
MCH: 33.3 pg (ref 26.0–34.0)
MCHC: 34.8 g/dL (ref 30.0–36.0)
MCV: 95.7 fL (ref 80.0–100.0)
Platelets: 58 10*3/uL — ABNORMAL LOW (ref 150–400)
RBC: 3.48 MIL/uL — ABNORMAL LOW (ref 4.22–5.81)
RDW: 15.6 % — ABNORMAL HIGH (ref 11.5–15.5)
WBC: 2.9 10*3/uL — ABNORMAL LOW (ref 4.0–10.5)
nRBC: 0 % (ref 0.0–0.2)

## 2021-11-07 LAB — ECHOCARDIOGRAM COMPLETE
Area-P 1/2: 2.38 cm2
Height: 72 in
S' Lateral: 2.9 cm
Weight: 2458.57 oz

## 2021-11-07 LAB — MRSA NEXT GEN BY PCR, NASAL: MRSA by PCR Next Gen: NOT DETECTED

## 2021-11-07 LAB — HEPATITIS PANEL, ACUTE
HCV Ab: NONREACTIVE
Hep A IgM: NONREACTIVE
Hep B C IgM: NONREACTIVE
Hepatitis B Surface Ag: NONREACTIVE

## 2021-11-07 LAB — PHOSPHORUS: Phosphorus: 3.2 mg/dL (ref 2.5–4.6)

## 2021-11-07 LAB — CD4/CD8 (T-HELPER/T-SUPPRESSOR CELL)
CD4 absolute: 215 /uL — ABNORMAL LOW (ref 400–1790)
CD4%: 36.52 % (ref 33–65)
CD8 T Cell Abs: 263 /uL (ref 190–1000)
CD8tox: 44.73 % — ABNORMAL HIGH (ref 12–40)
Ratio: 0.82 — ABNORMAL LOW (ref 1.0–3.0)
Total lymphocyte count: 588 /uL — ABNORMAL LOW (ref 1000–4000)

## 2021-11-07 LAB — LACTIC ACID, PLASMA: Lactic Acid, Venous: 0.7 mmol/L (ref 0.5–1.9)

## 2021-11-07 MED ORDER — ADULT MULTIVITAMIN W/MINERALS CH
1.0000 | ORAL_TABLET | Freq: Every day | ORAL | Status: DC
  Administered 2021-11-07 – 2021-11-08 (×2): 1 via ORAL
  Filled 2021-11-07 (×2): qty 1

## 2021-11-07 MED ORDER — FOLIC ACID 1 MG PO TABS
1.0000 mg | ORAL_TABLET | Freq: Every day | ORAL | Status: DC
  Administered 2021-11-07 – 2021-11-08 (×2): 1 mg via ORAL
  Filled 2021-11-07 (×2): qty 1

## 2021-11-07 MED ORDER — MAGNESIUM SULFATE 2 GM/50ML IV SOLN
2.0000 g | Freq: Once | INTRAVENOUS | Status: AC
  Administered 2021-11-07: 04:00:00 2 g via INTRAVENOUS
  Filled 2021-11-07: qty 50

## 2021-11-07 MED ORDER — POTASSIUM CHLORIDE CRYS ER 20 MEQ PO TBCR
40.0000 meq | EXTENDED_RELEASE_TABLET | Freq: Once | ORAL | Status: AC
  Administered 2021-11-07: 04:00:00 40 meq via ORAL
  Filled 2021-11-07: qty 2

## 2021-11-07 NOTE — Progress Notes (Signed)
Echocardiogram 2D Echocardiogram has been performed.  [name] Lacy Rayetta Veith RDCS 11/07/2021, 11:40 AM

## 2021-11-07 NOTE — Progress Notes (Signed)
K+ 3.4, Mg 1.7 Replaced per protocol  

## 2021-11-07 NOTE — Progress Notes (Signed)
NAME:  Gary [name], MRN:  [id number], DOB:  Mar 25, 1986, LOS: 1 ADMISSION DATE:  11/05/2021, CONSULTATION DATE:  11/06/2021 REFERRING MD:  Dr. Karene Fry, CHIEF COMPLAINT: EtOH withdrawal  History of Present Illness:  Gary [name] is a 35 year old male with a past medical history significant for HIV, bipolar disease, EtOH abuse, subdural hematoma, and seizures who presented to the emergency department after being picked up at the local homeless shelter with reports of needing "detox and mental hospital".  On ED arrival patient endorsed both suicidal and homicidal ideations.  On ED arrival patient was seen hemodynamically stable.  Lab work also relatively unremarkable apart from mild elevation in AST.  Alcohol level 458 but negative urine tox screen.  While in the emergency department patient appears to be cleared by psychiatry but later developed alcohol withdrawal symptoms with elevated CIWA of 14.  He has symptoms received benzodiazepine and phenobarbital load but withdrawal persisted and Precedex drip was initiated.  PCCM consulted for further management and admission for  Pertinent  Medical History  HIV Bipolar disease EtOH abuse Subdural hematoma Seizure  Significant Hospital Events: Including procedures, antibiotic start and stop dates in addition to other pertinent events   12/28 presented to the ED for request of alcohol detox and suicidal/homicidal ideations. 12/29 cleared from a psychiatric standpoint but developed alcohol withdrawal symptoms requiring benzodiazepine and phenobarbital load later followed by Precedex drip 12/30 remained stable on Precedex overnight  Interim History / Subjective:  Seen lying in bed undergoing echocardiogram Sleepy but will arouse to verbal stimuli  Objective   Blood pressure 127/85, pulse 60, temperature 98.5 F (36.9 C), temperature source Axillary, resp. rate 14, height 6' (1.829 m), weight 69.7 kg, SpO2 96 %.        Intake/Output  Summary (Last 24 hours) at 11/07/2021 1049 Last data filed at 11/07/2021 1040 Gross per 24 hour  Intake 1065.35 ml  Output 1550 ml  Net -484.65 ml   Filed Weights   11/06/21 1501 11/06/21 2112 11/07/21 0416  Weight: 70.3 kg 69.7 kg 69.7 kg    Examination: General: Acute ill-appearing thin adult male lying in bed in no acute distress HEENT: [place]/AT, MM pink/moist, PERRL,  Neuro: Lethargic but will arouse to verbal stimuli on Precedex drip CV: s1s2 regular rate and rhythm, no murmur, rubs, or gallops,  PULM: Clear to auscultation bilaterally, no increased work of breathing, no added breath sounds GI: soft, bowel sounds active in all 4 quadrants, non-tender, non-distended, tolerating oral diet Extremities: warm/dry, no edema  Skin: no rashes or lesions '  Resolved Hospital Problem list     Assessment & Plan:  EtOH withdrawal with daily alcohol consumption -Patient reports drinking several hard alcoholic drinks daily with frequent episodes of withdrawal -EtOH level  Elevated AST P: Continue Precedex drip, wean as able As needed benzodiazepines CIWA scale to 4 Supplement thiamine folate and Multivite Seizure precautions  Check hepatitis panel  HIV -Per chart review it appears patient is not currently on Union therapy P: Check viral load Consider ID consult  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 P: Maintain neuro protective measures; goal for eurothermia, euglycemia, eunatermia, normoxia, and PCO2 goal of 35-40 Nutrition and bowel regiment  Seizure precautions  Aspirations precautions   Chest pain -Patient reports central pressure like chest pain on ED assessment  P: Follow-up on echocardiogram Continuous telemetry  Best Practice (right click and "Reselect all SmartList Selections" daily)   Diet/type: NPO DVT prophylaxis: systemic heparin GI  prophylaxis: PPI Lines: N/A Foley:  N/A Code Status:  full code Last date of  multidisciplinary goals of care discussion pending   Critical care time:     Performed by: Erikka Follmer D. Harris   Total critical care time: 37 minutes  Critical care time was exclusive of separately billable procedures and treating other patients.  Critical care was necessary to treat or prevent imminent or life-threatening deterioration.  Critical care was time spent personally by me on the following activities: development of treatment plan with patient and/or surrogate as well as nursing, discussions with consultants, evaluation of patient's response to treatment, examination of patient, obtaining history from patient or surrogate, ordering and performing treatments and interventions, ordering and review of laboratory studies, ordering and review of radiographic studies, pulse oximetry and re-evaluation of patient's condition.  Tifini Reeder D. Tiburcio Pea, NP-C [facility] Personal contact information can be found on Amion  11/07/2021, 10:49 AM

## 2021-11-07 NOTE — Progress Notes (Signed)
VS have been stable. Calm and cooperative. Precedex gtt is at 0.7 mcg/kg/hr and effective. Spot checking his sats  and last sat was 96% on RA. 1:1 sitter remains at bedside. No s/s of distress.

## 2021-11-07 NOTE — Progress Notes (Signed)
PCCM progress note  Phone consult held with infectious disease physician.  Brief chart review completed and no acute indications for need of ID consultation.  Patient needs to follow-up with outpatient clinic to reestablish HART therapy.  ID physician did not recommend continuing Hart therapy if patient has not been compliant.  When mentation and clinical picture allow will provide education on need for HIV management.  CD4 count currently pending.  Continue supportive care   Reida Hem D. Tiburcio Pea, NP-C [facility] Personal contact information can be found on Amion  11/07/2021, 5:19 PM

## 2021-11-07 NOTE — TOC Initial Note (Signed)
Transition of Care [facility]) - Initial/Assessment Note    Patient Details  Name: Gary [name] MRN: [id number] Date of Birth: [birth year]  [facility] CM/SW Contact:    Golda Acre, RN Phone Number: 11/07/2021, 7:50 AM  Clinical Narrative:                will call the mother and see who the poa is and call [place] for advance directive  Expected Discharge Plan: Home/Self Care Barriers to Discharge: Continued Medical Work up   Patient Goals and CMS Choice Patient states their goals for this hospitalization and ongoing recovery are:: pt sedated CMS Medicare.gov Compare Post Acute Care list provided to:: Patient Choice offered to / list presented to : [facility] POA / Guardian (mother)  Expected Discharge Plan and Services Expected Discharge Plan: Home/Self Care   Discharge Planning Services: CM Consult   Living arrangements for the past 2 months: Homeless                                      Prior Living Arrangements/Services Living arrangements for the past 2 months: Homeless Lives with:: Self Patient language and need for interpreter reviewed:: Yes              Criminal Activity/Legal Involvement Pertinent to Current Situation/Hospitalization: No - Comment as needed  Activities of Daily Living Home Assistive Devices/Equipment: None ADL Screening (condition at time of admission) Patient's cognitive ability adequate to safely complete daily activities?: Yes Is the patient deaf or have difficulty hearing?: No Does the patient have difficulty seeing, even when wearing glasses/contacts?: No Does the patient have difficulty concentrating, remembering, or making decisions?: Yes Patient able to express need for assistance with ADLs?: Yes Does the patient have difficulty dressing or bathing?: No Independently performs ADLs?: Yes (appropriate for developmental age) Does the patient have difficulty walking or climbing stairs?: No Weakness of Legs:  None Weakness of Arms/Hands: None  Permission Sought/Granted                  Emotional Assessment Appearance:: Appears stated age Attitude/Demeanor/Rapport: Unable to Assess Affect (typically observed): Unable to Assess Orientation: : Fluctuating Orientation (Suspected and/or reported Sundowners) Alcohol / Substance Use: Alcohol Use, Illicit Drugs Psych Involvement: No (comment)  Admission diagnosis:  Suicidal ideation [R45.851] Homicidal ideation [R45.850] Withdrawal symptoms, alcohol (HCC) [F10.939] Alcohol withdrawal syndrome without complication (HCC) [F10.930] Alcoholic intoxication without complication (HCC) [F10.920] Patient Active Problem List   Diagnosis Date Noted   Withdrawal symptoms, alcohol (HCC) 11/06/2021   Constipation 07/30/2021   Alcohol-induced insomnia (HCC)    MDD (major depressive disorder), recurrent episode, 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/26/2021   Neutropenia (HCC) 07/26/2021   Alcohol withdrawal seizure without complication (HCC)    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   Elevated troponin 10/19/2020   Alcohol withdrawal (HCC) 10/19/2020   Alcohol abuse    Suicidal ideation 06/14/2020   Pancreatitis 06/13/2020   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   Malnutrition of moderate degree 05/07/2020   Gastrointestinal hemorrhage    Alcoholic hepatitis without ascites    Melena 05/05/2020   Alcoholic ketoacidosis 05/05/2020   Thrombocytopenia (HCC) 07/25/2019   Transaminitis 07/25/2019   Traumatic subdural hematoma 07/02/2019   Alcohol abuse with intoxication (HCC) 07/02/2019   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   Alcohol use disorder, severe, dependence (HCC) 04/16/2019   Major depressive disorder, 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

## 2021-11-08 ENCOUNTER — Inpatient Hospital Stay (HOSPITAL_COMMUNITY): Payer: Self-pay

## 2021-11-08 DIAGNOSIS — G9340 Encephalopathy, unspecified: Secondary | ICD-10-CM

## 2021-11-08 LAB — CBC
HCT: 35.3 % — ABNORMAL LOW (ref 39.0–52.0)
Hemoglobin: 12.1 g/dL — ABNORMAL LOW (ref 13.0–17.0)
MCH: 33.1 pg (ref 26.0–34.0)
MCHC: 34.3 g/dL (ref 30.0–36.0)
MCV: 96.4 fL (ref 80.0–100.0)
Platelets: 78 10*3/uL — ABNORMAL LOW (ref 150–400)
RBC: 3.66 MIL/uL — ABNORMAL LOW (ref 4.22–5.81)
RDW: 15.8 % — ABNORMAL HIGH (ref 11.5–15.5)
WBC: 4 10*3/uL (ref 4.0–10.5)
nRBC: 0 % (ref 0.0–0.2)

## 2021-11-08 LAB — BASIC METABOLIC PANEL
Anion gap: 7 (ref 5–15)
BUN: 7 mg/dL (ref 6–20)
CO2: 22 mmol/L (ref 22–32)
Calcium: 8.7 mg/dL — ABNORMAL LOW (ref 8.9–10.3)
Chloride: 101 mmol/L (ref 98–111)
Creatinine, Ser: 0.61 mg/dL (ref 0.61–1.24)
GFR, Estimated: >60 mL/min (ref >60)
Glucose, Bld: 123 mg/dL — ABNORMAL HIGH (ref 70–99)
Potassium: 3.6 mmol/L (ref 3.5–5.1)
Sodium: 130 mmol/L — ABNORMAL LOW (ref 135–145)

## 2021-11-08 LAB — PHOSPHORUS: Phosphorus: 2.9 mg/dL (ref 2.5–4.6)

## 2021-11-08 LAB — MAGNESIUM: Magnesium: 1.8 mg/dL (ref 1.7–2.4)

## 2021-11-08 LAB — TROPONIN I (HIGH SENSITIVITY)
Troponin I (High Sensitivity): 3 ng/L (ref <18)
Troponin I (High Sensitivity): 3 ng/L (ref <18)

## 2021-11-08 IMAGING — US US ABDOMEN LIMITED
1 series · 15 of 25 positions shown · non-contrast
Comparison: CT abdomen and pelvis [DATE]. ultrasound abdomen
[DATE].

CLINICAL DATA: Fatty liver.

EXAM:
ULTRASOUND ABDOMEN LIMITED RIGHT UPPER QUADRANT

[Series 1: us abdomen limited ruq mc & wl · 15 of 87 slices shown]
[im 1/87]
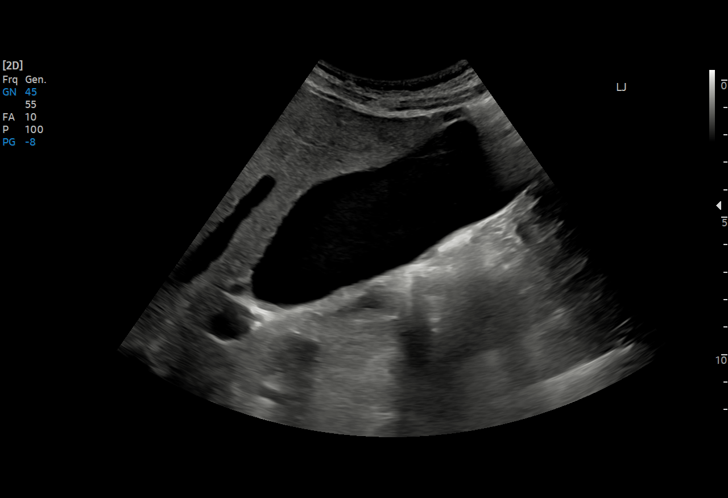
[im 8/87]
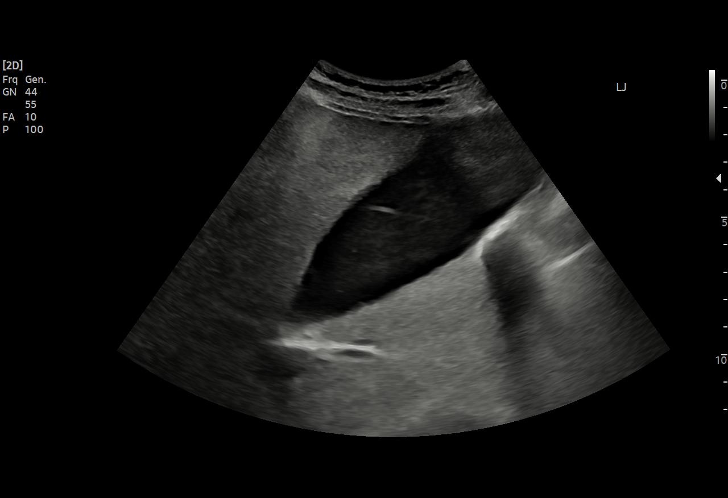
[im 15/87]
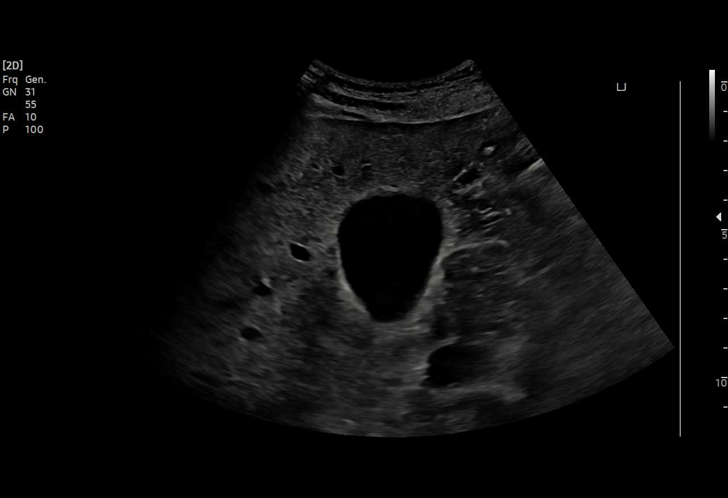
[im 18/87]
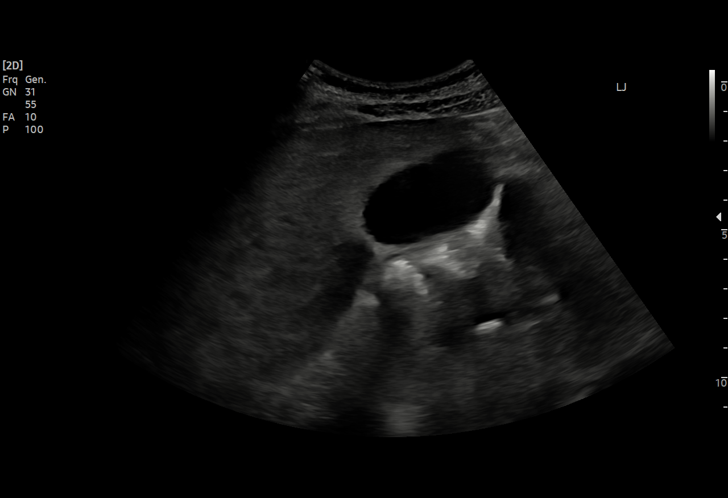
[im 26/87]
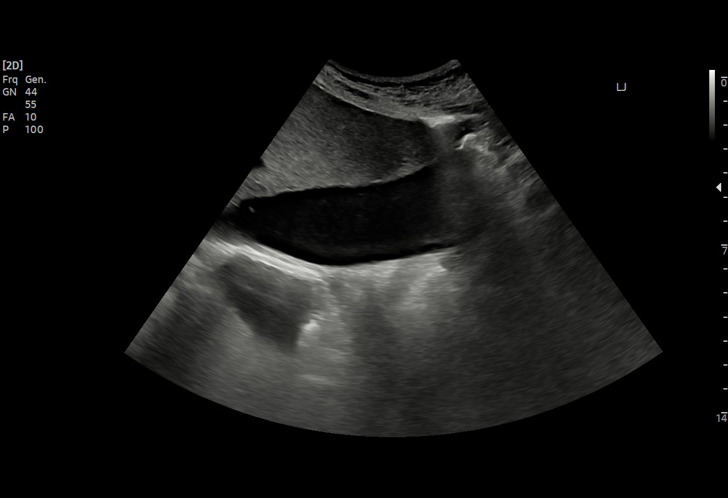
[im 33/87]
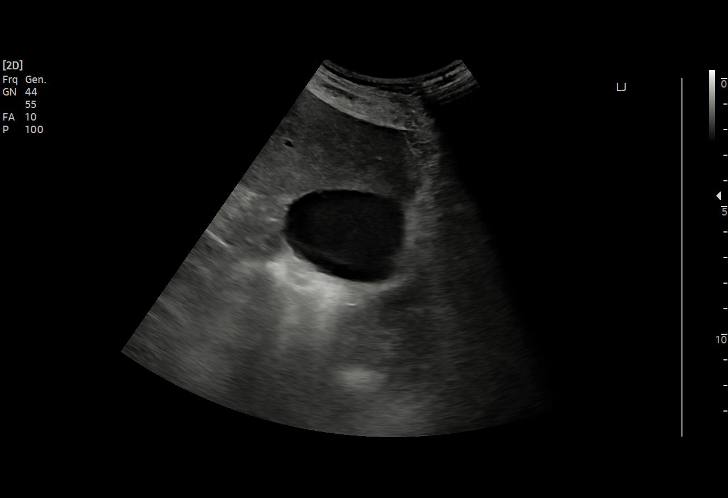
[im 36/87]
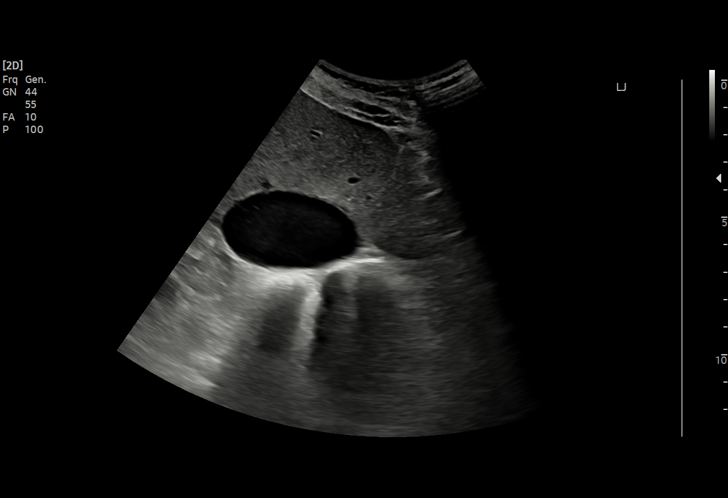
[im 44/87]
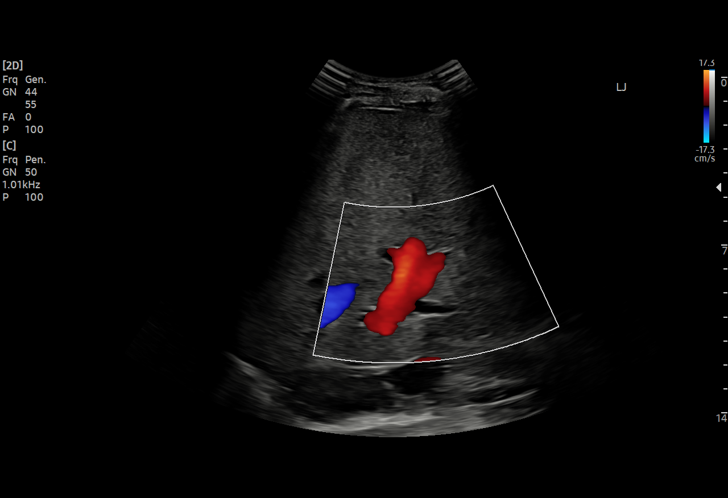
[im 51/87]
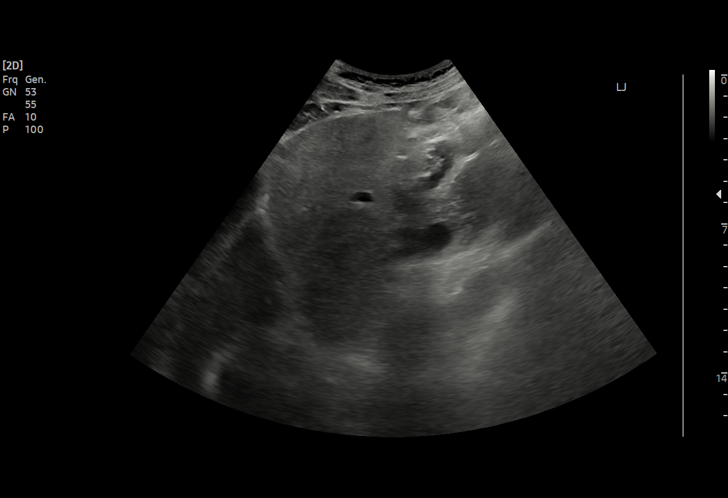
[im 54/87]
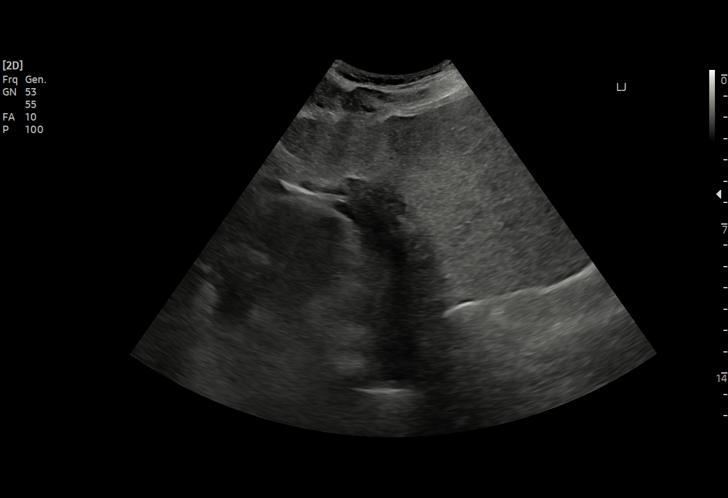
[im 61/87]
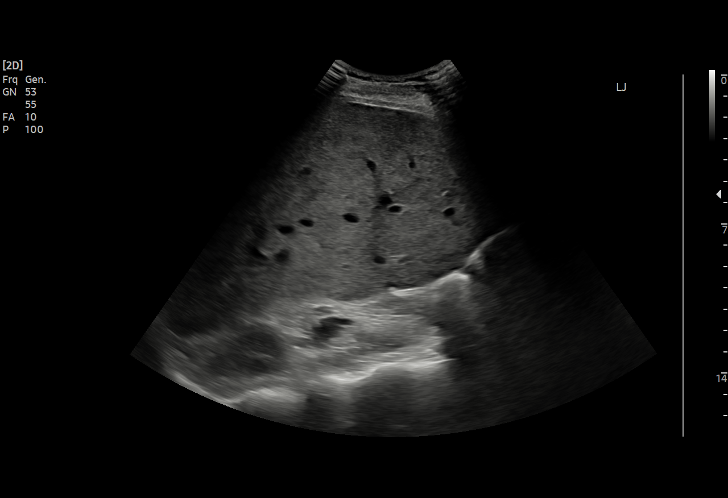
[im 69/87]
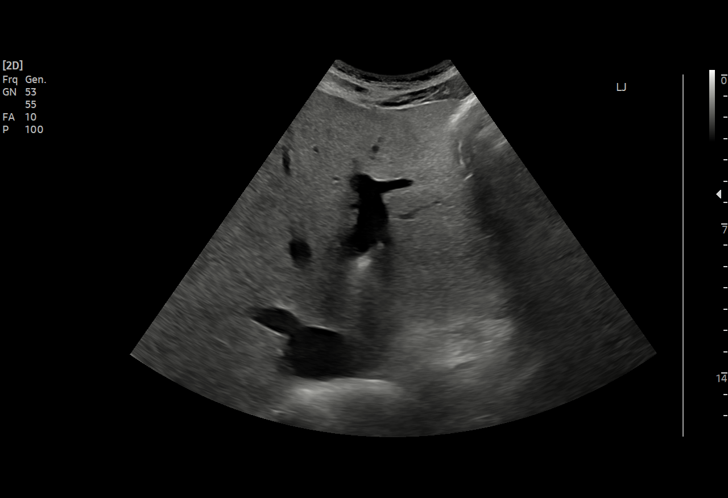
[im 72/87]
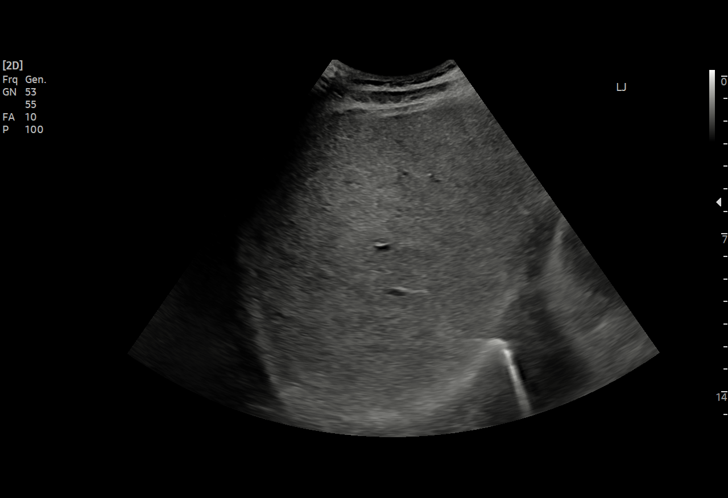
[im 79/87]
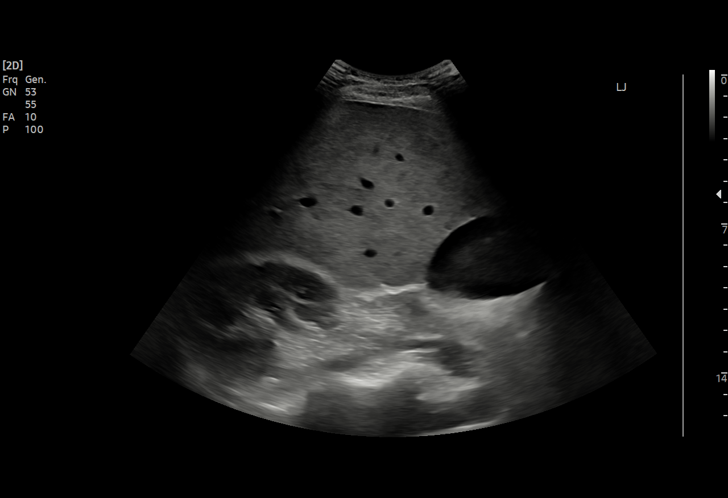
[im 87/87]
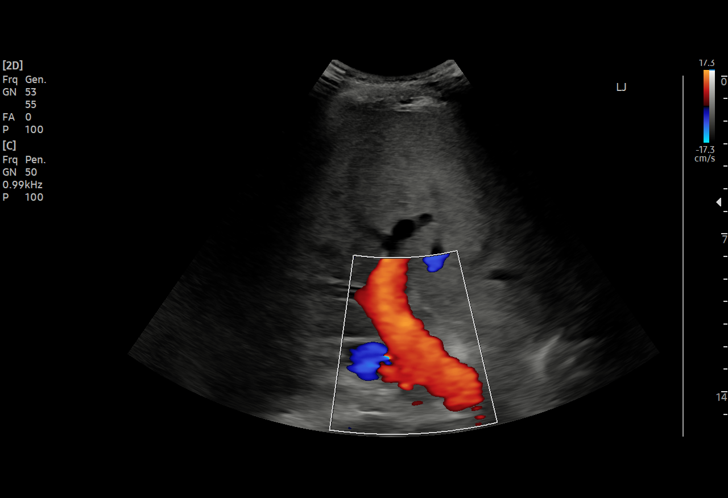

[15 of 25 positions shown; findings below may reference images not displayed]

FINDINGS: Gallbladder:

No gallstones or wall thickening visualized. No sonographic Murphy
sign noted by sonographer.

Common bile duct:

Diameter: 4 mm.

Liver:

There is a mildly echogenic lesion in the left lobe of the liver
measuring 2.5 x 1.8 x 1.9 cm without significant vascularity. This
appears to have different shape compared to similar echogenicity
lesion seen on prior CT. Increase in parenchymal echogenicity.
Portal vein is patent on color Doppler imaging with normal direction
of blood flow towards the liver.

Other: None.
IMPRESSION: 1. Echogenic liver likely related to diffuse hepatocellular disease
such as fatty infiltration.
2. Indeterminate mildly echogenic lesion in the left lobe of the
liver measuring 2.5 cm. This appears to have a different shape when
compared to the prior ultrasound on [DATE]. Findings may
represent focal fatty infiltration. Other lesions can not be
excluded. This can be further characterized with MRI as clinically
appropriate.

## 2021-11-08 MED ORDER — MAGNESIUM SULFATE 2 GM/50ML IV SOLN
2.0000 g | Freq: Once | INTRAVENOUS | Status: AC
  Administered 2021-11-08: 2 g via INTRAVENOUS
  Filled 2021-11-08: qty 50

## 2021-11-08 MED ORDER — POTASSIUM CHLORIDE CRYS ER 20 MEQ PO TBCR
40.0000 meq | EXTENDED_RELEASE_TABLET | Freq: Once | ORAL | Status: AC
  Administered 2021-11-08: 40 meq via ORAL
  Filled 2021-11-08: qty 2

## 2021-11-08 MED ORDER — NITROGLYCERIN 0.4 MG SL SUBL
0.4000 mg | SUBLINGUAL_TABLET | SUBLINGUAL | Status: DC | PRN
  Filled 2021-11-08: qty 1

## 2021-11-08 MED ORDER — BENZONATATE 100 MG PO CAPS
200.0000 mg | ORAL_CAPSULE | Freq: Three times a day (TID) | ORAL | Status: DC | PRN
  Administered 2021-11-08: 200 mg via ORAL
  Filled 2021-11-08: qty 2

## 2021-11-08 NOTE — Progress Notes (Signed)
K+ 3.6, Mg 1.8 Replaced per protocol  

## 2021-11-08 NOTE — Progress Notes (Signed)
Patient has been requesting to leave the hospital throughout the day. Patient finally stated he was going to leave and asked to sign his AMA paperwork. The patient is alert and oriented x 4, and is able to ambulate independently. The charge nurse and I both educated on the dangers of leaving AMA, but he decided to leave anyway. Paperwork was filled out, patient was assisted to the front entrance of the hospital. MD was made aware.

## 2021-11-08 NOTE — Progress Notes (Signed)
This nurse entered the room for routine care, pt stated he had CP in the center of his chest with a pain rating of 4 out of 10. Obtained EKG, notified E-link provider, notified charge nurse, and placed 4L [place] on pt. Will continue current plan of care.

## 2021-11-08 NOTE — Progress Notes (Signed)
eLink Physician-Brief Progress Note Patient Name: Gary [name] DOB: Jun 21, 1986 MRN: [id number]   Date of Service  11/08/2021  HPI/Events of Note  Patient complains of 4/10 chest pain but localizes to epigastrium with a single finger, no other symptoms, 12 lead EKG is benign.  eICU Interventions  Will cycle Troponin. Will treat empirically with PRN oral analgesic and SL Nitroglycerin pending results of Troponin.         Dashayla Theissen U Kyen Taite 11/08/2021, 1:50 AM

## 2021-11-08 NOTE — Progress Notes (Signed)
NAME:  Gary [name], MRN:  [id number], DOB:  15-May-1986, LOS: 2 ADMISSION DATE:  11/05/2021, CONSULTATION DATE:  11/06/2021 REFERRING MD:  Dr. Karene Fry, CHIEF COMPLAINT: EtOH withdrawal  BRIEF  Gary [name] is a 35 year old male with a past medical history significant for HIV, bipolar disease, EtOH abuse, subdural hematoma, and seizures who presented to the emergency department after being picked up at the local homeless shelter with reports of needing "detox and mental hospital".  On ED arrival patient endorsed both suicidal and homicidal ideations.  On ED arrival patient was seen hemodynamically stable.  Lab work also relatively unremarkable apart from mild elevation in AST.  Alcohol level 458 but negative urine tox screen.  While in the emergency department patient appears to be cleared by psychiatry but later developed alcohol withdrawal symptoms with elevated CIWA of 14.  He has symptoms received benzodiazepine and phenobarbital load but withdrawal persisted and Precedex drip was initiated.  PCCM consulted for further management and admission for  Pertinent  Medical History  HIV Bipolar disease EtOH abuse Subdural hematoma Seizure  Significant Hospital Events: Including procedures, antibiotic start and stop dates in addition to other pertinent events   12/28 presented to the ED for request of alcohol detox and suicidal/homicidal ideations. 12/29 cleared from a psychiatric standpoint but developed alcohol withdrawal symptoms requiring benzodiazepine and phenobarbital load later followed by Precedex drip 12/30 remained stable on Precedex overnight  Interim History / Subjective:    12/31-off Precedex since early hours this morning.  At 1 point was able to go to the bathroom walking.  But then later started wanting to go home AGAINST MEDICAL ADVICE started getting restless and tremulous.  Lorazepam given and he is in the bed.  Sitter is at the bedside.  He is still on clonazepam CIWA  and also clonidine detox protocol  He is on oxygen 3 to 4 L nasal cannula saturating 100% but nurse feels oxygen is not required as he is not hypoxemic.  Infectious disease yesterday recommended heart therapy only as outpatient and not to institute it as inpatient.  Electrolytes replaced today thin cachectic male  Cardiac echo shows ejection fraction 53% low normal  Objective   Blood pressure 109/65, pulse (!) 55, temperature 98.3 F (36.8 C), temperature source Oral, resp. rate 16, height 6' (1.829 m), weight 74.4 kg, SpO2 100 %.        Intake/Output Summary (Last 24 hours) at 11/08/2021 1123 Last data filed at 11/08/2021 0816 Gross per 24 hour  Intake 65507.94 ml  Output 500 ml  Net 65007.94 ml   Filed Weights   11/06/21 2112 11/07/21 0416 11/08/21 0600  Weight: 69.7 kg 69.7 kg 74.4 kg    Examination: Thin cachectic male lying in the bed.  Mildly tremulous but otherwise oriented.  Notes that he wants to go home but then later drifts off.  Clear to auscultation normal heart sounds abdomen soft.  No tachycardia no hypertension  Resolved Hospital Problem list     Assessment & Plan:  EtOH withdrawal with acute encephalopathy at admission/delirium tremens with daily alcohol consumption -Patient reports drinking several hard alcoholic drinks daily with frequent episodes of withdrawal -EtOH level  Elevated AST at admission -fatty liver on ultrasound at admission 03/16/2021 Concern for mass on ultrasound 03/16/2021  11/08/2021: Off Precedex infusion but still requiring lorazepam CIWA protocol and also found in detox protocol and sitter  P: DC Precedex off the MAR \ Continue continue clonidine detox protocol  Continue lorazepam CIWA  protocol As needed benzodiazepines CI Supplement thiamine folate and Multivite Seizure precautions  Await hepatitis panel Repeat right upper quadrant ultrasound Recheck LFT 11/09/2021  HIV -Per chart review it appears patient is not currently on  Hart therapy  11/08/2021: ID recommending outpatient HAART therapy  P: Await viral load Outpatient ID consult referral  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  Probably due to alcohol  11/08/2021: Stable  Plan  - - PRBC for hgb </= 6.9gm%    - exceptions are   -  if ACS susepcted/confirmed then transfuse for hgb </= 8.0gm%,  or    -  active bleeding with hemodynamic instability, then transfuse regardless of hemoglobin value   At at all times try to transfuse 1 unit prbc as possible with exception of active hemorrhage -Continue Lovenox we will closely monitor platelet count  Electrolyte imbalance with hypokalemia and hypomagnesemia -11/08/2021  Plan - Replete  Best Practice (right click and "Reselect all SmartList Selections" daily)   Diet/type: Regular diet  DVT prophylaxis: Low-dose Lovenox  GI prophylaxis: PPI Lines: N/A Foley:  N/A Code Status:  full code Updates: Patient at the bedside  Transfer to MedSurg with sitter and critical care medicine off  - d.w Dr Micheline Chapman    Dr. Kalman Shan, M.D., F.C.C.P,  Pulmonary and Critical Care Medicine Staff Physician, [facility] Center Director - Interstitial Lung Disease  Program  Pulmonary Fibrosis [facility] at [place], [place], [address]  NPI Number:  NPI #[id number]  Pager: [phone], If no answer  -> Check AMION or Try [phone] Telephone (clinical office): [phone] Telephone (research): [phone]  11:23 AM 11/08/2021    LABS    PULMONARY No results for input(s): PHART, PCO2ART, PO2ART, HCO3, TCO2, O2SAT in the last 168 hours.  Invalid input(s): PCO2, PO2  CBC Recent Labs  Lab 11/06/21 1611 11/07/21 0251 11/08/21 0254  HGB 12.5* 11.6* 12.1*  HCT 35.9* 33.3* 35.3*  WBC 3.8* 2.9* 4.0  PLT 72* 58* 78*    COAGULATION No results for input(s): INR in the last 168 hours.  CARDIAC  No results for input(s): TROPONINI in the last 168 hours. No results for input(s): PROBNP in the last 168 hours.   CHEMISTRY Recent Labs  Lab 11/06/21 0054 11/06/21 1611 11/07/21 0251 11/08/21 0254  NA 139  --  133* 130*  K 3.7  --  3.4* 3.6  CL 102  --  100 101  CO2 24  --  25 22  GLUCOSE 95  --  107* 123*  BUN 8  --  10 7  CREATININE 0.67 0.64 0.71 0.61  CALCIUM 8.9  --  8.7* 8.7*  MG  --   --  1.7 1.8  PHOS  --   --  3.2 2.9   Estimated Creatinine Clearance: 135.6 mL/min (by C-G formula based on SCr of 0.61 mg/dL).   LIVER Recent Labs  Lab 11/06/21 0054  AST 61*  ALT 34  ALKPHOS 93  BILITOT 0.7  PROT 8.4*  ALBUMIN 4.3     INFECTIOUS Recent Labs  Lab 11/06/21 1611 11/07/21 0251  LATICACIDVEN  --  0.7  PROCALCITON <0.10  --      ENDOCRINE CBG (last 3)  No results for input(s): GLUCAP in the last 72 hours.       IMAGING x48h  - image(s) personally visualized  -   highlighted in bold ECHOCARDIOGRAM COMPLETE  Result Date: 11/07/2021    ECHOCARDIOGRAM REPORT   Patient Name:   ADE STMARIE Date of Exam: 11/07/2021 Medical Rec #:  [id number]       Height:       72.0 in Accession #:    [id number]      Weight:        153.7 lb Date of Birth:  1986-06-09       BSA:          1.905 m Patient Age:    35 years        BP:           105/71 mmHg Patient Gender: M               HR:           66 bpm. Exam Location:  Inpatient Procedure: 2D Echo, 3D Echo, Color Doppler and Cardiac Doppler Indications:    R07.9* Chest pain, unspecified  History:        Patient has prior history of Echocardiogram examinations, most                 recent 03/19/2021. Risk Factors:Polysubstance Abuse. Active ETOH                 withdrawal.  Sonographer:    Irving Burton Senior RDCS Referring Phys: [phone] WHITNEY D HARRIS IMPRESSIONS  1. Left ventricular ejection fraction, by estimation, is 50 to 55%. Left ventricular ejection fraction by 3D volume is 53 %. The left ventricle has low normal function. The left ventricle has no regional wall motion abnormalities. Left ventricular diastolic parameters were normal.  2. Right ventricular systolic function is normal. The right ventricular size is normal. Tricuspid regurgitation signal is inadequate for assessing PA pressure.  3. The mitral valve is grossly normal. Trivial mitral valve regurgitation. No evidence of mitral stenosis.  4. The aortic valve is tricuspid. Aortic valve regurgitation is not visualized. No aortic stenosis is present. Conclusion(s)/Recommendation(s): Normal biventricular function without evidence of hemodynamically significant valvular heart disease. FINDINGS  Left Ventricle: Left ventricular ejection fraction, by estimation, is 50 to 55%. Left ventricular ejection fraction by 3D volume is 53 %. The left ventricle has low normal function. The left ventricle has no regional wall motion abnormalities. The left ventricular internal cavity size was normal in size. There is no left ventricular hypertrophy. Left ventricular diastolic parameters were normal. Right Ventricle: The right ventricular size is normal. No increase in right ventricular wall thickness. Right ventricular systolic function is normal.  Tricuspid regurgitation signal is inadequate for assessing PA pressure. Left Atrium: Left atrial size was normal in size. Right Atrium: Right atrial size was normal in size. Pericardium: There is no evidence of pericardial effusion. Mitral Valve: The mitral valve is grossly normal. Trivial mitral valve regurgitation. No evidence of mitral valve stenosis. Tricuspid  Valve: The tricuspid valve is grossly normal. Tricuspid valve regurgitation is trivial. No evidence of tricuspid stenosis. Aortic Valve: The aortic valve is tricuspid. Aortic valve regurgitation is not visualized. No aortic stenosis is present. Pulmonic Valve: The pulmonic valve was grossly normal. Pulmonic valve regurgitation is not visualized. No evidence of pulmonic stenosis. Aorta: The aortic root and ascending aorta are structurally normal, with no evidence of dilitation. Venous: The inferior vena cava was not well visualized. IAS/Shunts: The atrial septum is grossly normal.  LEFT VENTRICLE PLAX 2D LVIDd:         3.70 cm         Diastology LVIDs:         2.90 cm         LV e' medial:    10.90 cm/s LV PW:         0.80 cm         LV E/e' medial:  4.9 LV IVS:        0.90 cm         LV e' lateral:   10.40 cm/s LVOT diam:     2.30 cm         LV E/e' lateral: 5.1 LV SV:         68 LV SV Index:   36 LVOT Area:     4.15 cm        3D Volume EF                                LV 3D EF:    Left                                             ventricul                                             ar                                             ejection                                             fraction                                             by 3D                                             volume is                                             53 %.  3D Volume EF:                                3D EF:        53 %                                LV EDV:       158 ml                                LV ESV:       75 ml                                 LV SV:        83 ml RIGHT VENTRICLE RV S prime:     10.70 cm/s TAPSE (M-mode): 2.2 cm LEFT ATRIUM             Index        RIGHT ATRIUM           Index LA diam:        3.40 cm 1.78 cm/m   RA Area:     20.60 cm LA Vol (A2C):   47.5 ml 24.94 ml/m  RA Volume:   59.90 ml  31.45 ml/m LA Vol (A4C):   54.8 ml 28.77 ml/m LA Biplane Vol: 51.6 ml 27.09 ml/m  AORTIC VALVE LVOT Vmax:   80.20 cm/s LVOT Vmean:  58.500 cm/s LVOT VTI:    0.164 m  AORTA Ao Root diam: 3.70 cm Ao Asc diam:  3.40 cm MITRAL VALVE MV Area (PHT): 2.38 cm    SHUNTS MV Decel Time: 319 msec    Systemic VTI:  0.16 m MV E velocity: 53.30 cm/s  Systemic Diam: 2.30 cm MV A velocity: 72.80 cm/s MV E/A ratio:  0.73 Lennie Odor MD Electronically signed by Lennie Odor MD Signature Date/Time: 11/07/2021/1:37:08 PM    Final

## 2021-11-08 NOTE — Progress Notes (Signed)
Patient earlier indicated he wanted to leave AMA. Convinced him to stay but now RN informed that he signed AMA paper work. SHe reported that he had capacity and physical ability and left after signing AMA paper work    SIGNATURE    Dr. Kalman Shan, M.D., F.C.C.P,  Pulmonary and Critical Care Medicine Staff Physician, [facility] Center Director - Interstitial Lung Disease  Program  Pulmonary Fibrosis [facility] at [facility] [place], [place], [address]  NPI Number:  NPI #[id number]  Pager: [phone], If no answer  -> Check AMION or Try [phone] Telephone (clinical office): [phone] Telephone (research): [phone]  5:41 PM 11/08/2021

## 2021-11-20 NOTE — Discharge Summary (Signed)
DISCHARGE SUMMARY    Date of admit: 11/05/2021 10:40 PM Date of discharge: 11/08/2021  5:40 PM Length of Stay: 2 days  PCP is Pcp, No Discharge status: ALIVE - Against MEdical Advice    PROBLEM LIST Principal Problem:   Withdrawal symptoms, alcohol (HCC)    SUMMARY JAKEVIOUS HOLLISTER was 36 y.o. patient with    has a past medical history of Alcohol abuse, HIV (human immunodeficiency virus infection) (HCC), Seizures (HCC), and Subdural hematoma.   has a past surgical history that includes none and Incision and drainage perirectal abscess (N/A, 09/24/2016).   Admitted on 11/05/2021 with  LABRANDON KNOCH is a 36 year old male with a past medical history significant for HIV, bipolar disease, EtOH abuse, subdural hematoma, and seizures who presented to the emergency department after being picked up at the local homeless shelter with reports of needing "detox and mental hospital".  On ED arrival patient endorsed both suicidal and homicidal ideations.   On ED arrival patient was seen hemodynamically stable.  Lab work also relatively unremarkable apart from mild elevation in AST.  Alcohol level 458 but negative urine tox screen.  While in the emergency department patient appears to be cleared by psychiatry but later developed alcohol withdrawal symptoms with elevated CIWA of 14.  He has symptoms received benzodiazepine and phenobarbital load but withdrawal persisted and Precedex drip was initiated.  PCCM consulted for further management and admission for   Significant Hospital Events: Including procedures, antibiotic start and stop dates in addition to other pertinent events   12/28 presented to the ED for request of alcohol detox and suicidal/homicidal ideations. 12/29 cleared from a psychiatric standpoint but developed alcohol withdrawal symptoms requiring benzodiazepine and phenobarbital load later followed by Precedex drip 12/30 remained stable on Precedex overnight 12/31-off  Precedex since early hours this morning.  At 1 point was able to go to the bathroom walking.  But then later started wanting to go home AGAINST MEDICAL ADVICE started getting restless and tremulous.  Lorazepam given and he is in the bed.  Sitter is at the bedside.  He is still on clonazepam CIWA and also clonidine detox protocol. He is on oxygen 3 to 4 L nasal cannula saturating 100% but nurse feels oxygen is not required as he is not hypoxemic. Infectious disease yesterday recommended heart therapy only as outpatient and not to institute it as inpatient. Electrolytes replaced today thin cachectic male. Cardiac echo shows ejection fraction 53% low normal   LAter in day signed out against medical advice. HE was physical fit and per RN had [facility]            SIGNED Dr. Kalman Shan, M.D., [facility].C.P Pulmonary and Critical Care Medicine Staff Physician [facility] Pulmonary and Critical Care Pager: [phone], If no answer or between  15:00h - 7:00h: call [phone]  11/20/2021 5:14 PM

## 2021-12-31 ENCOUNTER — Observation Stay (HOSPITAL_COMMUNITY): Payer: Self-pay

## 2021-12-31 ENCOUNTER — Inpatient Hospital Stay (HOSPITAL_COMMUNITY)
Admission: EM | Admit: 2021-12-31 | Discharge: 2022-01-06 | DRG: 894 | Discharge status: Against Medical Advice | Payer: Self-pay | Attending: Internal Medicine | Admitting: Internal Medicine

## 2021-12-31 ENCOUNTER — Emergency Department (HOSPITAL_COMMUNITY): Payer: Self-pay

## 2021-12-31 ENCOUNTER — Encounter (HOSPITAL_COMMUNITY): Payer: Self-pay | Admitting: *Deleted

## 2021-12-31 ENCOUNTER — Other Ambulatory Visit: Payer: Self-pay

## 2021-12-31 DIAGNOSIS — Y908 Blood alcohol level of 240 mg/100 ml or more: Secondary | ICD-10-CM | POA: Diagnosis present

## 2021-12-31 DIAGNOSIS — R45851 Suicidal ideations: Secondary | ICD-10-CM | POA: Diagnosis present

## 2021-12-31 DIAGNOSIS — F10221 Alcohol dependence with intoxication delirium: Secondary | ICD-10-CM | POA: Diagnosis present

## 2021-12-31 DIAGNOSIS — Z20822 Contact with and (suspected) exposure to covid-19: Secondary | ICD-10-CM | POA: Diagnosis present

## 2021-12-31 DIAGNOSIS — R270 Ataxia, unspecified: Secondary | ICD-10-CM

## 2021-12-31 DIAGNOSIS — D7589 Other specified diseases of blood and blood-forming organs: Secondary | ICD-10-CM | POA: Diagnosis present

## 2021-12-31 DIAGNOSIS — D696 Thrombocytopenia, unspecified: Secondary | ICD-10-CM | POA: Diagnosis present

## 2021-12-31 DIAGNOSIS — F10929 Alcohol use, unspecified with intoxication, unspecified: Secondary | ICD-10-CM

## 2021-12-31 DIAGNOSIS — D6959 Other secondary thrombocytopenia: Secondary | ICD-10-CM | POA: Diagnosis present

## 2021-12-31 DIAGNOSIS — Z21 Asymptomatic human immunodeficiency virus [HIV] infection status: Secondary | ICD-10-CM | POA: Diagnosis present

## 2021-12-31 DIAGNOSIS — F319 Bipolar disorder, unspecified: Secondary | ICD-10-CM | POA: Diagnosis present

## 2021-12-31 DIAGNOSIS — Z5900 Homelessness unspecified: Secondary | ICD-10-CM

## 2021-12-31 DIAGNOSIS — F10920 Alcohol use, unspecified with intoxication, uncomplicated: Secondary | ICD-10-CM

## 2021-12-31 DIAGNOSIS — Z888 Allergy status to other drugs, medicaments and biological substances status: Secondary | ICD-10-CM

## 2021-12-31 DIAGNOSIS — D709 Neutropenia, unspecified: Secondary | ICD-10-CM | POA: Diagnosis present

## 2021-12-31 DIAGNOSIS — F10231 Alcohol dependence with withdrawal delirium: Principal | ICD-10-CM | POA: Diagnosis present

## 2021-12-31 DIAGNOSIS — F10229 Alcohol dependence with intoxication, unspecified: Secondary | ICD-10-CM

## 2021-12-31 DIAGNOSIS — F10921 Alcohol use, unspecified with intoxication delirium: Principal | ICD-10-CM

## 2021-12-31 DIAGNOSIS — B20 Human immunodeficiency virus [HIV] disease: Secondary | ICD-10-CM | POA: Diagnosis present

## 2021-12-31 DIAGNOSIS — F10932 Alcohol use, unspecified with withdrawal with perceptual disturbance: Secondary | ICD-10-CM | POA: Diagnosis present

## 2021-12-31 DIAGNOSIS — Z91199 Patient's noncompliance with other medical treatment and regimen due to unspecified reason: Secondary | ICD-10-CM

## 2021-12-31 DIAGNOSIS — K7010 Alcoholic hepatitis without ascites: Secondary | ICD-10-CM | POA: Diagnosis present

## 2021-12-31 DIAGNOSIS — Z818 Family history of other mental and behavioral disorders: Secondary | ICD-10-CM

## 2021-12-31 DIAGNOSIS — Z811 Family history of alcohol abuse and dependence: Secondary | ICD-10-CM

## 2021-12-31 DIAGNOSIS — F23 Brief psychotic disorder: Secondary | ICD-10-CM | POA: Diagnosis present

## 2021-12-31 DIAGNOSIS — F17210 Nicotine dependence, cigarettes, uncomplicated: Secondary | ICD-10-CM | POA: Diagnosis present

## 2021-12-31 DIAGNOSIS — F10939 Alcohol use, unspecified with withdrawal, unspecified: Secondary | ICD-10-CM | POA: Diagnosis present

## 2021-12-31 DIAGNOSIS — Z91048 Other nonmedicinal substance allergy status: Secondary | ICD-10-CM

## 2021-12-31 DIAGNOSIS — R7401 Elevation of levels of liver transaminase levels: Secondary | ICD-10-CM | POA: Diagnosis present

## 2021-12-31 DIAGNOSIS — F10139 Alcohol abuse with withdrawal, unspecified: Secondary | ICD-10-CM | POA: Diagnosis present

## 2021-12-31 LAB — COMPREHENSIVE METABOLIC PANEL
ALT: 102 U/L — ABNORMAL HIGH (ref 0–44)
ALT: 85 U/L — ABNORMAL HIGH (ref 0–44)
AST: 182 U/L — ABNORMAL HIGH (ref 15–41)
AST: 129 U/L — ABNORMAL HIGH (ref 15–41)
Albumin: 4.2 g/dL (ref 3.5–5.0)
Albumin: 3.5 g/dL (ref 3.5–5.0)
Alkaline Phosphatase: 54 U/L (ref 38–126)
Alkaline Phosphatase: 44 U/L (ref 38–126)
Anion gap: 12 (ref 5–15)
Anion gap: 9 (ref 5–15)
BUN: 12 mg/dL (ref 6–20)
BUN: 9 mg/dL (ref 6–20)
CO2: 23 mmol/L (ref 22–32)
CO2: 25 mmol/L (ref 22–32)
Calcium: 8.5 mg/dL — ABNORMAL LOW (ref 8.9–10.3)
Calcium: 8.1 mg/dL — ABNORMAL LOW (ref 8.9–10.3)
Chloride: 102 mmol/L (ref 98–111)
Chloride: 104 mmol/L (ref 98–111)
Creatinine, Ser: 0.57 mg/dL — ABNORMAL LOW (ref 0.61–1.24)
Creatinine, Ser: 0.54 mg/dL — ABNORMAL LOW (ref 0.61–1.24)
GFR, Estimated: >60 mL/min (ref >60)
GFR, Estimated: >60 mL/min (ref >60)
Glucose, Bld: 84 mg/dL (ref 70–99)
Glucose, Bld: 95 mg/dL (ref 70–99)
Potassium: 3.8 mmol/L (ref 3.5–5.1)
Potassium: 3.6 mmol/L (ref 3.5–5.1)
Sodium: 137 mmol/L (ref 135–145)
Sodium: 138 mmol/L (ref 135–145)
Total Bilirubin: 0.7 mg/dL (ref 0.3–1.2)
Total Bilirubin: 0.6 mg/dL (ref 0.3–1.2)
Total Protein: 7.6 g/dL (ref 6.5–8.1)
Total Protein: 6.7 g/dL (ref 6.5–8.1)

## 2021-12-31 LAB — CBC
HCT: 37.7 % — ABNORMAL LOW (ref 39.0–52.0)
Hemoglobin: 12.7 g/dL — ABNORMAL LOW (ref 13.0–17.0)
MCH: 34.6 pg — ABNORMAL HIGH (ref 26.0–34.0)
MCHC: 33.7 g/dL (ref 30.0–36.0)
MCV: 102.7 fL — ABNORMAL HIGH (ref 80.0–100.0)
Platelets: 81 10*3/uL — ABNORMAL LOW (ref 150–400)
RBC: 3.67 MIL/uL — ABNORMAL LOW (ref 4.22–5.81)
RDW: 14.6 % (ref 11.5–15.5)
WBC: 2.8 10*3/uL — ABNORMAL LOW (ref 4.0–10.5)
nRBC: 0 % (ref 0.0–0.2)

## 2021-12-31 LAB — CBC WITH DIFFERENTIAL/PLATELET
Abs Immature Granulocytes: 0.01 10*3/uL (ref 0.00–0.07)
Basophils Absolute: 0.1 10*3/uL (ref 0.0–0.1)
Basophils Relative: 1 %
Eosinophils Absolute: 0.1 10*3/uL (ref 0.0–0.5)
Eosinophils Relative: 3 %
HCT: 41.8 % (ref 39.0–52.0)
Hemoglobin: 14.4 g/dL (ref 13.0–17.0)
Immature Granulocytes: 0 %
Lymphocytes Relative: 48 %
Lymphs Abs: 1.7 10*3/uL (ref 0.7–4.0)
MCH: 35 pg — ABNORMAL HIGH (ref 26.0–34.0)
MCHC: 34.4 g/dL (ref 30.0–36.0)
MCV: 101.5 fL — ABNORMAL HIGH (ref 80.0–100.0)
Monocytes Absolute: 0.2 10*3/uL (ref 0.1–1.0)
Monocytes Relative: 6 %
Neutro Abs: 1.5 10*3/uL — ABNORMAL LOW (ref 1.7–7.7)
Neutrophils Relative %: 42 %
Platelets: 97 10*3/uL — ABNORMAL LOW (ref 150–400)
RBC: 4.12 MIL/uL — ABNORMAL LOW (ref 4.22–5.81)
RDW: 14.6 % (ref 11.5–15.5)
WBC: 3.6 10*3/uL — ABNORMAL LOW (ref 4.0–10.5)
nRBC: 0 % (ref 0.0–0.2)

## 2021-12-31 LAB — PHOSPHORUS: Phosphorus: 4.5 mg/dL (ref 2.5–4.6)

## 2021-12-31 LAB — PROTIME-INR
INR: 1 (ref 0.8–1.2)
Prothrombin Time: 13.1 seconds (ref 11.4–15.2)

## 2021-12-31 LAB — ETHANOL: Alcohol, Ethyl (B): 482 mg/dL (ref <10)

## 2021-12-31 LAB — LIPASE, BLOOD: Lipase: 116 U/L — ABNORMAL HIGH (ref 11–51)

## 2021-12-31 LAB — SALICYLATE LEVEL: Salicylate Lvl: <7 mg/dL — ABNORMAL LOW (ref 7.0–30.0)

## 2021-12-31 LAB — RESP PANEL BY RT-PCR (FLU A&B, COVID) ARPGX2
Influenza A by PCR: NEGATIVE
Influenza B by PCR: NEGATIVE
SARS Coronavirus 2 by RT PCR: NEGATIVE

## 2021-12-31 LAB — RAPID URINE DRUG SCREEN, HOSP PERFORMED
Amphetamines: NOT DETECTED
Barbiturates: NOT DETECTED
Benzodiazepines: POSITIVE — AB
Cocaine: NOT DETECTED
Opiates: NOT DETECTED
Tetrahydrocannabinol: NOT DETECTED

## 2021-12-31 LAB — TROPONIN I (HIGH SENSITIVITY)
Troponin I (High Sensitivity): 10 ng/L (ref <18)
Troponin I (High Sensitivity): 13 ng/L (ref <18)

## 2021-12-31 LAB — FOLATE: Folate: 6.5 ng/mL (ref >5.9)

## 2021-12-31 LAB — TSH: TSH: 3.238 u[IU]/mL (ref 0.350–4.500)

## 2021-12-31 LAB — CK: Total CK: 221 U/L (ref 49–397)

## 2021-12-31 LAB — ACETAMINOPHEN LEVEL: Acetaminophen (Tylenol), Serum: <10 ug/mL — ABNORMAL LOW (ref 10–30)

## 2021-12-31 LAB — MAGNESIUM: Magnesium: 1.8 mg/dL (ref 1.7–2.4)

## 2021-12-31 LAB — VITAMIN B12: Vitamin B-12: 306 pg/mL (ref 180–914)

## 2021-12-31 LAB — MRSA NEXT GEN BY PCR, NASAL: MRSA by PCR Next Gen: NOT DETECTED

## 2021-12-31 IMAGING — DX DG CHEST 1V PORT
1 series · 1 of 1 positions shown · non-contrast
Comparison: [DATE]

CLINICAL DATA: Chest pain and shortness of breath.

EXAM:
PORTABLE CHEST 1 VIEW

[chest ap]
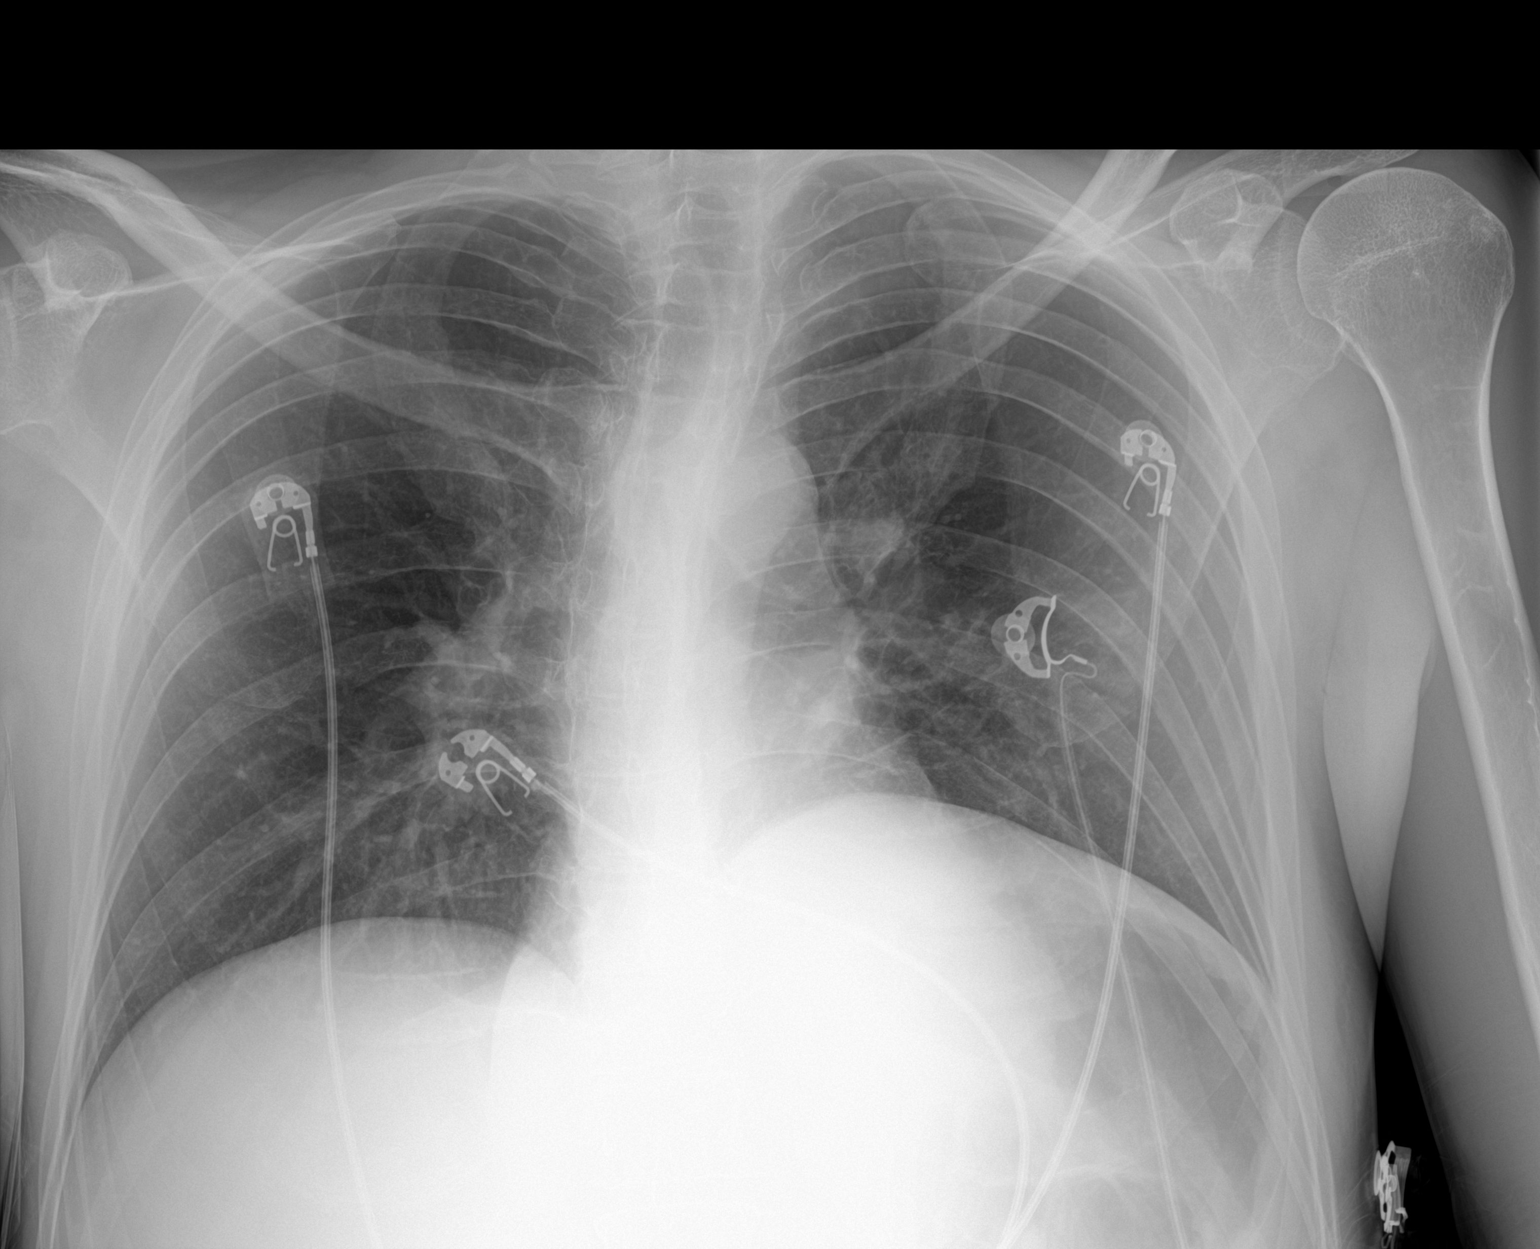

[1 of 1 positions shown; findings below may reference images not displayed]

FINDINGS: The heart size and mediastinal contours are within normal limits.
Low lung volumes are noted with mild elevation of the left
hemidiaphragm. Both lungs are otherwise clear. The visualized
skeletal structures are unremarkable.
IMPRESSION: No active disease.

## 2021-12-31 IMAGING — MR MR HEAD W/O CM
10 series · 48 of 48 positions shown · non-contrast
Comparison: CT head [DATE]

CLINICAL DATA: Altered mental status, concern for MAIZVILA

EXAM:
MRI HEAD WITHOUT CONTRAST
TECHNIQUE: Multiplanar, multiecho pulse sequences of the brain and surrounding
structures were obtained without intravenous contrast.

[Series 5: DWI · axial · 3.0mm · 1.36mm/px · z∈[+34,+163]mm · 9 of 103 slices shown (1 of 2)]
[im 1/103]
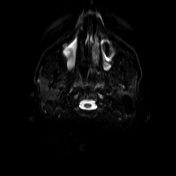
[im 13/103]
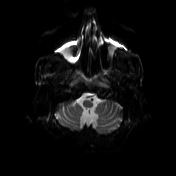
[im 26/103]
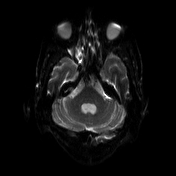
[im 39/103]
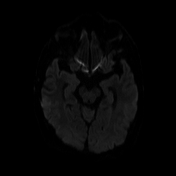
[im 52/103]
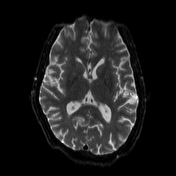
[im 64/103]
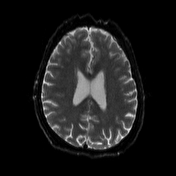
[im 77/103]
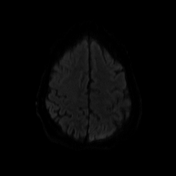
[im 90/103]
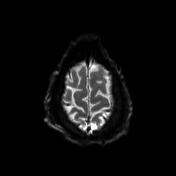
[im 103/103]
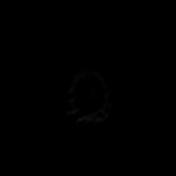

[Series 6: DWI · axial · 3.0mm · 1.36mm/px · z∈[+34,+163]mm · 4 of 52 slices shown (2 of 2)]
[im 1/52]
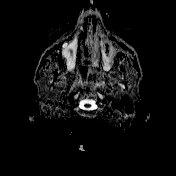
[im 18/52]
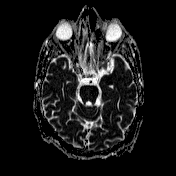
[im 35/52]
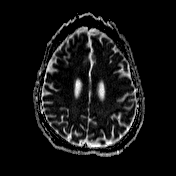
[im 52/52]
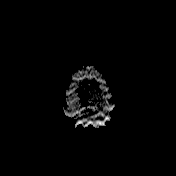

[Series 7: T1 · sagittal · 5.0mm · 0.75mm/px · 2 of 24 slices shown (1 of 2)]
[im 1/24]
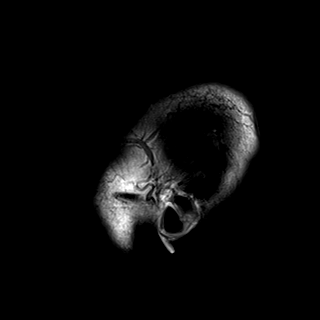
[im 24/24]
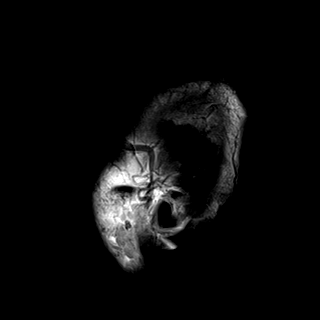

[Series 8: T2 · axial · 5.0mm · 0.62mm/px · z∈[-9,+155]mm · 2 of 28 slices shown (1 of 2)]
[im 1/28]
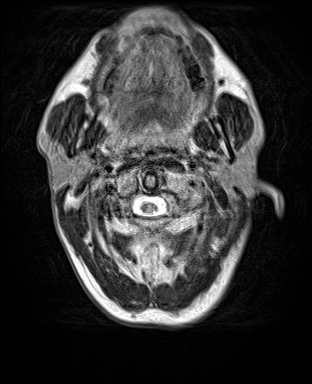
[im 28/28]
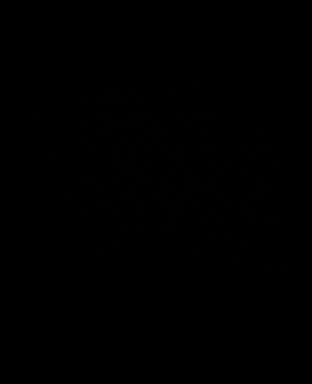

[Series 9: swi_images · axial · 3.0mm · 0.75mm/px · z∈[+13,+160]mm · 4 of 56 slices shown]
[im 1/56]
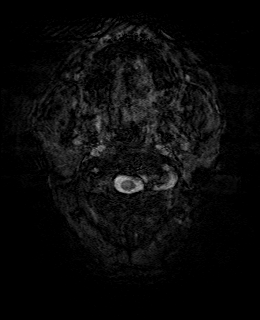
[im 19/56]
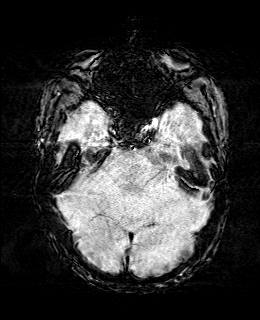
[im 37/56]
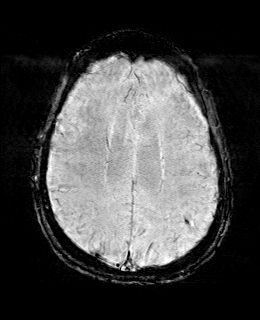
[im 56/56]
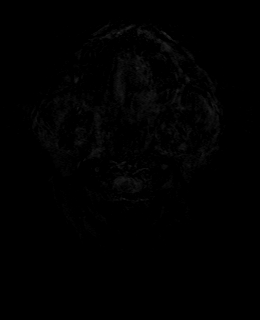

[Series 11: FLAIR · axial · 3.0mm · 0.75mm/px · z∈[-2,+147]mm · 4 of 54 slices shown]
[im 1/54]
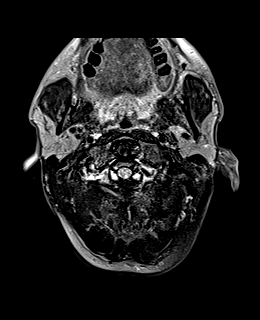
[im 18/54]
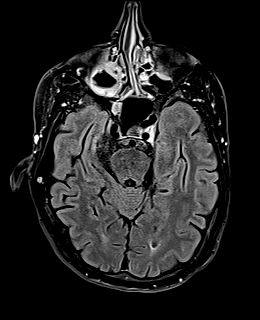
[im 36/54]
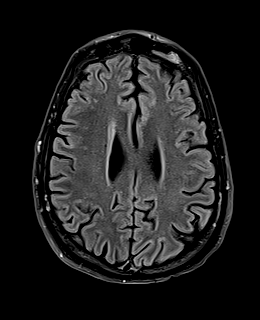
[im 54/54]
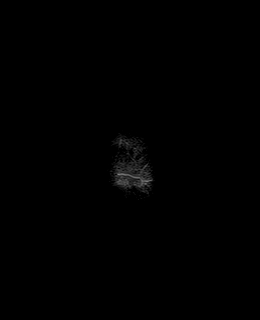

[Series 12: T1 · axial · 1.0mm · 0.94mm/px · z∈[+13,+167]mm · 14 of 176 slices shown (2 of 2)]
[im 1/176]
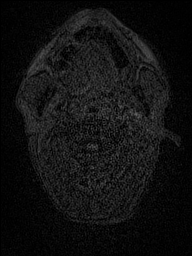
[im 14/176]
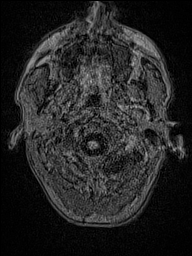
[im 27/176]
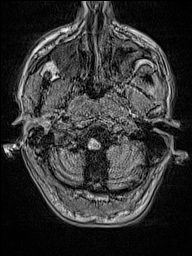
[im 41/176]
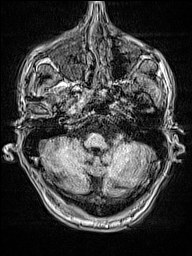
[im 54/176]
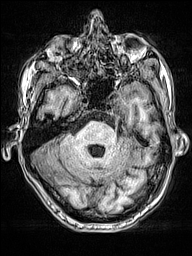
[im 68/176]
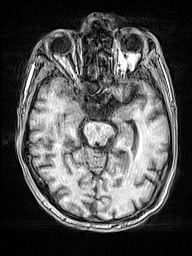
[im 81/176]
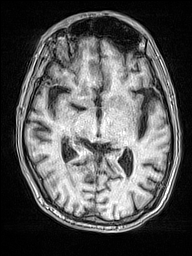
[im 95/176]
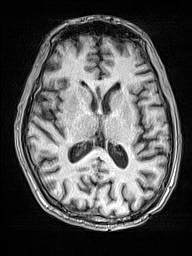
[im 108/176]
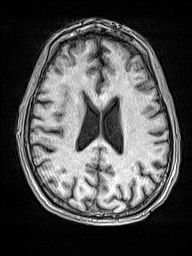
[im 122/176]
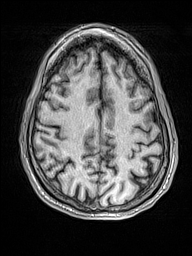
[im 135/176]
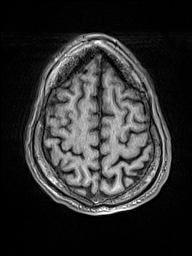
[im 149/176]
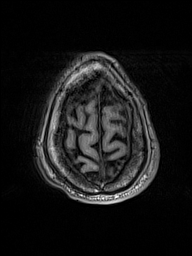
[im 162/176]
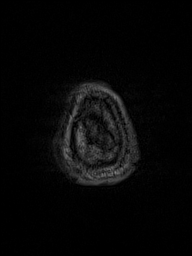
[im 176/176]
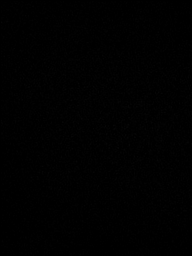

[Series 13: cor dwi_tracew · coronal · 5.0mm · 1.53mm/px · 4 of 56 slices shown]
[im 1/56]
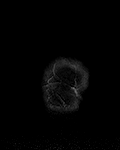
[im 19/56]
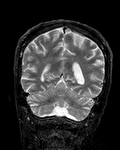
[im 37/56]
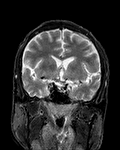
[im 56/56]
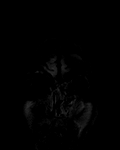

[Series 14: cor dwi_adc · coronal · 5.0mm · 1.53mm/px · 2 of 28 slices shown]
[im 1/28]
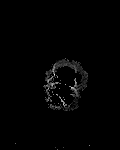
[im 28/28]
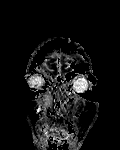

[Series 15: T2 · coronal · 5.0mm · 0.57mm/px · 3 of 38 slices shown (2 of 2)]
[im 1/38]
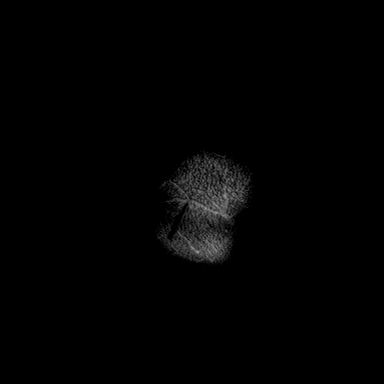
[im 19/38]
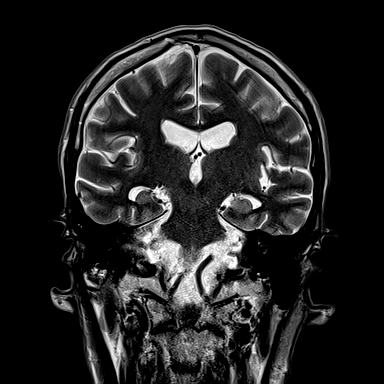
[im 38/38]
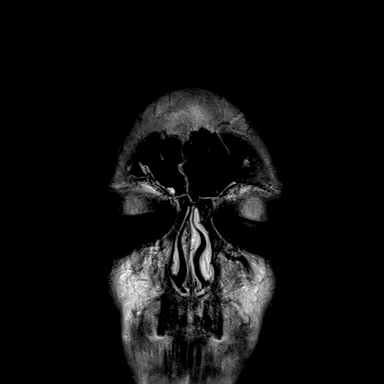

[48 of 48 positions shown; findings below may reference images not displayed]

FINDINGS: Brain: There is no evidence of acute intracranial hemorrhage,
extra-axial fluid collection, or acute infarct.

Background parenchymal volume is normal. The ventricles are normal
in size. There are scattered foci of FLAIR signal abnormality in the
subcortical and periventricular white matter, advanced for age.
There is no periventricular signal abnormality around the third
ventricle. The periaqueductal gray matter is normal. There is no
definite signal abnormality in the mamillary bodies, though these
are suboptimally assessed.

There is no mass lesion.  There is no midline shift.

Vascular: Normal flow voids.

Skull and upper cervical spine: Normal marrow signal.

Sinuses/Orbits: There is extensive mucosal thickening in the
bilateral maxillary sinuses, new since the prior study. The globes
and orbits are unremarkable.

Other: None.
IMPRESSION: 1. No acute intracranial pathology. No evidence of Wernicke's
encephalopathy.
2. Scattered small foci of FLAIR signal abnormality in the
subcortical and periventricular white matter are nonspecific but are
advanced for age. Finding may reflect age advanced chronic white
matter microangiopathy, migraines, prior infection/inflammation,
among other etiologies.
3. Extensive mucosal thickening in the maxillary sinuses, new since
the prior study from [DATE].

## 2021-12-31 MED ORDER — LORAZEPAM 2 MG/ML IJ SOLN
1.0000 mg | INTRAMUSCULAR | Status: DC | PRN
  Administered 2021-12-31 – 2022-01-01 (×4): 2 mg via INTRAVENOUS
  Administered 2022-01-01: 4 mg via INTRAVENOUS
  Administered 2022-01-01: 2 mg via INTRAVENOUS
  Administered 2022-01-01: 4 mg via INTRAVENOUS
  Administered 2022-01-02: 2 mg via INTRAVENOUS
  Filled 2021-12-31: qty 2
  Filled 2021-12-31 (×3): qty 1
  Filled 2021-12-31 (×3): qty 2
  Filled 2021-12-31 (×2): qty 1

## 2021-12-31 MED ORDER — BENZTROPINE MESYLATE 0.5 MG PO TABS
1.0000 mg | ORAL_TABLET | Freq: Four times a day (QID) | ORAL | Status: DC | PRN
  Administered 2021-12-31 – 2022-01-03 (×4): 1 mg via ORAL
  Filled 2021-12-31 (×3): qty 2

## 2021-12-31 MED ORDER — ONDANSETRON HCL 4 MG PO TABS: 4.0000 mg | ORAL_TABLET | Freq: Four times a day (QID) | ORAL | Status: DC | PRN

## 2021-12-31 MED ORDER — LORAZEPAM 2 MG/ML IJ SOLN: 0.0000 mg | Freq: Two times a day (BID) | INTRAMUSCULAR | Status: DC

## 2021-12-31 MED ORDER — MUPIROCIN 2 % EX OINT
1.0000 "application " | TOPICAL_OINTMENT | Freq: Two times a day (BID) | CUTANEOUS | Status: DC
  Filled 2021-12-31: qty 22

## 2021-12-31 MED ORDER — VITAMIN B-12 1000 MCG PO TABS
1000.0000 ug | ORAL_TABLET | Freq: Every day | ORAL | Status: DC
  Administered 2021-12-31 – 2022-01-06 (×7): 1000 ug via ORAL
  Filled 2021-12-31 (×7): qty 1

## 2021-12-31 MED ORDER — LORAZEPAM 2 MG/ML IJ SOLN
0.0000 mg | Freq: Four times a day (QID) | INTRAMUSCULAR | Status: DC
  Administered 2021-12-31 (×2): 2 mg via INTRAVENOUS
  Filled 2021-12-31 (×2): qty 1

## 2021-12-31 MED ORDER — THIAMINE HCL 100 MG PO TABS
100.0000 mg | ORAL_TABLET | Freq: Every day | ORAL | Status: DC
  Administered 2021-12-31 – 2022-01-06 (×6): 100 mg via ORAL
  Filled 2021-12-31 (×6): qty 1

## 2021-12-31 MED ORDER — ONDANSETRON HCL 4 MG/2ML IJ SOLN
4.0000 mg | Freq: Four times a day (QID) | INTRAMUSCULAR | Status: DC | PRN
  Administered 2022-01-02 (×2): 4 mg via INTRAVENOUS
  Filled 2021-12-31 (×2): qty 2

## 2021-12-31 MED ORDER — LORAZEPAM 2 MG/ML IJ SOLN
0.0000 mg | INTRAMUSCULAR | Status: AC
  Administered 2021-12-31 (×2): 2 mg via INTRAVENOUS
  Administered 2021-12-31: 4 mg via INTRAVENOUS
  Administered 2021-12-31: 3 mg via INTRAVENOUS
  Administered 2022-01-01: 2 mg via INTRAVENOUS
  Administered 2022-01-01: 4 mg via INTRAVENOUS
  Administered 2022-01-01: 2 mg via INTRAVENOUS
  Administered 2022-01-01: 1 mg via INTRAVENOUS
  Administered 2022-01-01 (×2): 2 mg via INTRAVENOUS
  Administered 2022-01-01: 3 mg via INTRAVENOUS
  Administered 2022-01-02: 2 mg via INTRAVENOUS
  Filled 2021-12-31: qty 1
  Filled 2021-12-31: qty 2
  Filled 2021-12-31: qty 1
  Filled 2021-12-31 (×4): qty 2
  Filled 2021-12-31 (×2): qty 1
  Filled 2021-12-31: qty 2
  Filled 2021-12-31: qty 1

## 2021-12-31 MED ORDER — CHLORHEXIDINE GLUCONATE CLOTH 2 % EX PADS
6.0000 | MEDICATED_PAD | Freq: Every day | CUTANEOUS | Status: AC
  Administered 2021-12-31 – 2022-01-04 (×3): 6 via TOPICAL

## 2021-12-31 MED ORDER — MAGNESIUM SULFATE 2 GM/50ML IV SOLN
2.0000 g | Freq: Once | INTRAVENOUS | Status: AC
  Administered 2021-12-31: 2 g via INTRAVENOUS
  Filled 2021-12-31: qty 50

## 2021-12-31 MED ORDER — HALOPERIDOL 1 MG PO TABS
5.0000 mg | ORAL_TABLET | Freq: Four times a day (QID) | ORAL | Status: DC | PRN
  Administered 2021-12-31: 5 mg via ORAL
  Filled 2021-12-31 (×2): qty 5

## 2021-12-31 MED ORDER — LORAZEPAM 1 MG PO TABS: 0.0000 mg | ORAL_TABLET | Freq: Two times a day (BID) | ORAL | Status: DC

## 2021-12-31 MED ORDER — SODIUM CHLORIDE 0.9 % IV BOLUS
1000.0000 mL | Freq: Once | INTRAVENOUS | Status: AC
  Administered 2021-12-31: 1000 mL via INTRAVENOUS

## 2021-12-31 MED ORDER — LORAZEPAM 1 MG PO TABS
1.0000 mg | ORAL_TABLET | ORAL | Status: DC | PRN
  Administered 2021-12-31: 2 mg via ORAL
  Administered 2022-01-02: 3 mg via ORAL
  Filled 2021-12-31: qty 4
  Filled 2021-12-31: qty 2
  Filled 2021-12-31: qty 3

## 2021-12-31 MED ORDER — LORAZEPAM 2 MG/ML IJ SOLN
0.0000 mg | Freq: Three times a day (TID) | INTRAMUSCULAR | Status: DC
  Administered 2022-01-02 (×2): 4 mg via INTRAVENOUS
  Filled 2021-12-31: qty 2

## 2021-12-31 MED ORDER — FOLIC ACID 1 MG PO TABS
1.0000 mg | ORAL_TABLET | Freq: Every day | ORAL | Status: DC
  Administered 2021-12-31 – 2022-01-06 (×7): 1 mg via ORAL
  Filled 2021-12-31 (×7): qty 1

## 2021-12-31 MED ORDER — KCL IN DEXTROSE-NACL 20-5-0.9 MEQ/L-%-% IV SOLN
INTRAVENOUS | Status: AC
  Filled 2021-12-31 (×9): qty 1000

## 2021-12-31 MED ORDER — ADULT MULTIVITAMIN W/MINERALS CH
1.0000 | ORAL_TABLET | Freq: Every day | ORAL | Status: DC
  Administered 2021-12-31 – 2022-01-06 (×7): 1 via ORAL
  Filled 2021-12-31 (×7): qty 1

## 2021-12-31 MED ORDER — THIAMINE HCL 100 MG/ML IJ SOLN
500.0000 mg | Freq: Once | INTRAMUSCULAR | Status: AC
  Administered 2021-12-31: 500 mg via INTRAVENOUS
  Filled 2021-12-31: qty 6

## 2021-12-31 MED ORDER — THIAMINE HCL 100 MG/ML IJ SOLN
100.0000 mg | Freq: Every day | INTRAMUSCULAR | Status: DC
  Administered 2022-01-02: 100 mg via INTRAVENOUS
  Filled 2021-12-31: qty 2

## 2021-12-31 MED ORDER — LORAZEPAM 1 MG PO TABS: 0.0000 mg | ORAL_TABLET | Freq: Four times a day (QID) | ORAL | Status: DC

## 2021-12-31 MED ORDER — METOPROLOL TARTRATE 5 MG/5ML IV SOLN: 5.0000 mg | Freq: Four times a day (QID) | INTRAVENOUS | Status: DC | PRN

## 2021-12-31 NOTE — ED Notes (Signed)
Pt placed on O2 at 2L due to O2 sats dropping down to 82-85%; pt lying in bed with loud snoring respirations

## 2021-12-31 NOTE — ED Notes (Signed)
Pt given graham crackers, sandwich and ginger ale

## 2021-12-31 NOTE — ED Provider Notes (Signed)
[facility]-EMERGENCY DEPT Provider Note   CSN: [id number] Arrival date & time: 12/31/21  0018     History  Chief Complaint  Patient presents with   Hallucinations    Visual and auditory; ETOH abuse    Gary [name] is a 36 y.o. male.  HPI     This is a 36 year old male who presents with alcohol intoxication.  Patient states that he last drank alcohol just prior to arrival.  He reports that he drinks "a shit ton."  He states that he mostly drinks liquor but also drinks beer.  States he has not had much to eat or drink in the last 4 days.  He drank 1/5 of vodka earlier today.  He states he normally drinks more than that.  He is slightly tremulous.  He called EMS because friends encouraged him to do so.  He states he has been having chest discomfort over the last 3 to 4 hours.  He also states that he is nauseous and "my stomach always hurts."  Has not had any fevers.  Does report hallucinations and seeing "clowns."  Has a history of alcohol withdrawal in the past requiring ICU admission.  Patient states that sometimes he feels like he should just "drink until everything ends."  Denies any history of self-harm.  Home Medications Prior to Admission medications   Medication Sig Start Date End Date Taking? Authorizing Provider  bictegravir-emtricitabine-tenofovir AF (BIKTARVY) 50-200-25 MG TABS tablet Take 1 tablet by mouth daily. Patient not taking: Reported on 09/22/2021 08/31/21   Ardis Hughs, NP  chlordiazePOXIDE (LIBRIUM) 25 MG capsule 50mg  PO TID x 1D, then 25-50mg  PO BID X 1D, then 25-50mg  PO QD X 1D Patient not taking: Reported on 09/22/2021 09/06/21   09/08/21, PA-C  famotidine (PEPCID) 20 MG tablet Take 1 tablet (20 mg total) by mouth 2 (two) times daily. Patient not taking: Reported on 06/01/2019 05/14/19 06/02/19  06/04/19, MD      Allergies    Tegretol [carbamazepine] and Caffeine    Review of Systems   Review of Systems   Constitutional:  Negative for fever.  Cardiovascular:  Positive for chest pain.  Gastrointestinal:  Positive for abdominal pain and nausea.  Psychiatric/Behavioral:  Positive for hallucinations.   All other systems reviewed and are negative.  Physical Exam Updated Vital Signs BP 116/77    Pulse 93    Temp 97.7 F (36.5 C) (Oral)    Resp 19    Ht 1.829 m (6')    Wt 68 kg    SpO2 93%    BMI 20.34 kg/m  Physical Exam Vitals and nursing note reviewed.  Constitutional:      Appearance: He is well-developed.     Comments: Appears older than stated age  HENT:     Head: Normocephalic and atraumatic.     Nose:     Comments: Bilateral injected conjunctive a    Mouth/Throat:     Mouth: Mucous membranes are dry.  Eyes:     Pupils: Pupils are equal, round, and reactive to light.     Comments: Nystagmus noted  Cardiovascular:     Rate and Rhythm: Normal rate and regular rhythm.     Heart sounds: Normal heart sounds. No murmur heard. Pulmonary:     Effort: Pulmonary effort is normal. No respiratory distress.     Breath sounds: Normal breath sounds. No wheezing.  Abdominal:     General: Bowel sounds are normal.  Palpations: Abdomen is soft.     Tenderness: There is abdominal tenderness. There is no guarding or rebound.  Musculoskeletal:     Cervical back: Neck supple.     Right lower leg: No edema.     Left lower leg: No edema.  Lymphadenopathy:     Cervical: No cervical adenopathy.  Skin:    General: Skin is warm and dry.  Neurological:     Mental Status: He is alert and oriented to person, place, and time.     Comments: Slight resting tremor noted  Psychiatric:        Mood and Affect: Mood normal.    ED Results / Procedures / Treatments   Labs (all labs ordered are listed, but only abnormal results are displayed) Labs Reviewed  CBC WITH DIFFERENTIAL/PLATELET - Abnormal; Notable for the following components:      Result Value   WBC 3.6 (*)    RBC 4.12 (*)    MCV 101.5  (*)    MCH 35.0 (*)    Platelets 97 (*)    Neutro Abs 1.5 (*)    All other components within normal limits  COMPREHENSIVE METABOLIC PANEL - Abnormal; Notable for the following components:   Creatinine, Ser 0.57 (*)    Calcium 8.5 (*)    AST 182 (*)    ALT 102 (*)    All other components within normal limits  ETHANOL - Abnormal; Notable for the following components:   Alcohol, Ethyl (B) 482 (*)    All other components within normal limits  LIPASE, BLOOD - Abnormal; Notable for the following components:   Lipase 116 (*)    All other components within normal limits  RESP PANEL BY RT-PCR (FLU A&B, COVID) ARPGX2  VITAMIN B1  TROPONIN I (HIGH SENSITIVITY)  TROPONIN I (HIGH SENSITIVITY)    EKG EKG Interpretation  Date/Time:  Wednesday December 31 2021 00:54:30 EST Ventricular Rate:  80 PR Interval:  150 QRS Duration: 113 QT Interval:  410 QTC Calculation: 473 R Axis:   61 Text Interpretation: Sinus rhythm Probable left ventricular hypertrophy Borderline prolonged QT interval Baseline wander in lead(s) V3 Partial missing lead(s): V3 Confirmed by Ross MarcusHorton, Elier Zellars ([id number]) on 12/31/2021 1:21:28 AM  Radiology DG Chest Portable 1 View  Result Date: 12/31/2021 CLINICAL DATA:  Chest pain and shortness of breath. EXAM: PORTABLE CHEST 1 VIEW COMPARISON:  July 25, 2021 FINDINGS: The heart size and mediastinal contours are within normal limits. Low lung volumes are noted with mild elevation of the left hemidiaphragm. Both lungs are otherwise clear. The visualized skeletal structures are unremarkable. IMPRESSION: No active disease. Electronically Signed   By: Aram Candelahaddeus  Houston M.D.   On: 12/31/2021 00:54    Procedures .Critical Care Performed by: Shon BatonHorton, Gracen Southwell F, MD Authorized by: Shon BatonHorton, Amaiah Cristiano F, MD   Critical care provider statement:    Critical care time (minutes):  60   Critical care was necessary to treat or prevent imminent or life-threatening deterioration of the following  conditions:  Toxidrome (Alcohol intoxication, withdrawal, Wernicke's encephalopathy)   Critical care was time spent personally by me on the following activities:  Development of treatment plan with patient or surrogate, discussions with consultants, evaluation of patient's response to treatment, examination of patient, ordering and review of laboratory studies, ordering and review of radiographic studies, ordering and performing treatments and interventions, pulse oximetry, re-evaluation of patient's condition and review of old charts    Medications Ordered in ED Medications  LORazepam (ATIVAN) injection 0-4 mg (  2 mg Intravenous Given 12/31/21 2330)    Or  LORazepam (ATIVAN) tablet 0-4 mg ( Oral See Alternative 12/31/21 0762)  LORazepam (ATIVAN) injection 0-4 mg (has no administration in time range)    Or  LORazepam (ATIVAN) tablet 0-4 mg (has no administration in time range)  thiamine tablet 100 mg (has no administration in time range)    Or  thiamine (B-1) injection 100 mg (has no administration in time range)  sodium chloride 0.9 % bolus 1,000 mL (0 mLs Intravenous Stopped 12/31/21 0605)  thiamine (B-1) injection 500 mg (500 mg Intravenous Given 12/31/21 0636)    ED Course/ Medical Decision Making/ A&P Clinical Course as of 12/31/21 0641  Wed Dec 31, 2021  0629 Patient quite tremulous on reassessment.  Requested repeat CIWA.  He is unable to stand independently.  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  He could have an element of Wernicke's. [CH]  [id number] Discussed with neurology, Dr. Wilford Corner.  Recommends B1 level, thiamine 500 mg 3 times daily and MRI.  Would assess for clinical improvement.  If not improving, can request formal neurology evaluation. [CH]  [phone] Discussed with Dr. Imogene Burn.  At this time patient is voluntary and wants to be here.  His expressions of SI are vague and more pronounced with  acute intoxication.  Do not feel he needs involuntary commitment at this time.  We will plan for admission to the hospital. [CH]    Clinical Course User Index [CH] Siyon Linck, Mayer Masker, MD                           Medical Decision Making Amount and/or Complexity of Data Reviewed Labs: ordered. Radiology: ordered.  Risk OTC drugs. Prescription drug management. Decision regarding hospitalization.   This patient presents to the ED for concern of alcohol intoxication, SI, this involves an extensive number of treatment options, and is a complaint that carries with it a high risk of complications and morbidity.  The differential diagnosis includes intoxication, withdrawal, mood disorder related to substance abuse  MDM:    This a 36 year old male who presents with acute alcohol intoxication.  Reports vague SI.  No plan except for to drink himself to death.  History of significant alcohol use in the past and has been admitted for alcohol withdrawal.  He is acutely intoxicated.  He is quite tremulous.  Reporting some abdominal discomfort as well.  Labs obtained.  Notable for chronic thrombocytopenia.  Slight elevation in AST and ALT.  Blood alcohol level 482.  Lipase marginally elevated at 116.  Patient was allowed to metabolize.  See clinical course above.  On reassessment, he is quite ataxic.  Given that he was observed for 6 hours, would anticipate he would be able to walk at this point.  Would make me more concerned for Wernicke's encephalopathy.  B1 levels obtained.  Thiamine was ordered.  Discussed with neurology as above.  Patient also now scoring 12 on CIWA protocol.  He was given IV Ativan.  We will plan for admission (Labs, imaging)  Labs: I Ordered, and personally interpreted labs.  The pertinent results include: Thrombocytopenia, elevated LFTs, blood alcohol level greater than 400  Imaging Studies ordered: I ordered imaging studies including chest x-ray reassuring I independently  visualized and interpreted imaging. I agree with the radiologist interpretation  Additional history obtained from patient.  External records  from outside source obtained and reviewed including prior admission  Critical Interventions: IV Ativan, IV thiamine, fluids  Consultations: I requested consultation with the neurology,  and discussed lab and imaging findings as well as pertinent plan - they recommend: MRI, IV thiamine, formal neurology evaluation if not improving  Cardiac Monitoring: The patient was maintained on a cardiac monitor.  I personally viewed and interpreted the cardiac monitored which showed an underlying rhythm of: Normal sinus rhythm  Reevaluation: After the interventions noted above, I reevaluated the patient and found that they have : Now with ataxia   Considered admission for: Alcohol withdrawal, concern for Wernicke's encephalopathy  Social Determinants of Health: Polysubstance abuse  Disposition: Admit  Co morbidities that complicate the patient evaluation  Past Medical History:  Diagnosis Date   Alcohol abuse    HIV (human immunodeficiency virus infection) (HCC)    Seizures (HCC)    Subdural hematoma      Medicines Meds ordered this encounter  Medications   OR Linked Order Group    LORazepam (ATIVAN) injection 0-4 mg     Order Specific Question:   CIWA-AR < 5 =     Answer:   0 mg     Order Specific Question:   CIWA-AR 5 -10 =     Answer:   1 mg     Order Specific Question:   CIWA-AR 11 -15 =     Answer:   2 mg     Order Specific Question:   CIWA-AR 16 -24 =     Answer:   2 mg     Order Specific Question:   CIWA-AR 16 -24 =     Answer:   Recheck CIWA-AR in 1 hour; if > 15 notify MD     Order Specific Question:   CIWA-AR > 24 =     Answer:   4 mg     Order Specific Question:   CIWA-AR > 24 =     Answer:   Call Rapid Response    LORazepam (ATIVAN) tablet 0-4 mg     Order Specific Question:   CIWA-AR < 5 =     Answer:   0 mg     Order  Specific Question:   CIWA-AR 5 -10 =     Answer:   1 mg     Order Specific Question:   CIWA-AR 11 -15 =     Answer:   2 mg     Order Specific Question:   CIWA-AR 16 -24 =     Answer:   2 mg     Order Specific Question:   CIWA-AR 16 -24 =     Answer:   Recheck CIWA-AR in 1 hour; if > 15 notify MD     Order Specific Question:   CIWA-AR > 24 =     Answer:   4 mg     Order Specific Question:   CIWA-AR > 24 =     Answer:   Call Rapid Response   OR Linked Order Group    LORazepam (ATIVAN) injection 0-4 mg     Order Specific Question:   CIWA-AR < 5 =     Answer:   0 mg     Order Specific Question:   CIWA-AR 5 -10 =     Answer:   1 mg     Order Specific Question:   CIWA-AR 11 -15 =     Answer:   2 mg  Order Specific Question:   CIWA-AR 16 -24 =     Answer:   2 mg     Order Specific Question:   CIWA-AR 16 -24 =     Answer:   Recheck CIWA-AR in 1 hour; if > 15 notify MD     Order Specific Question:   CIWA-AR > 24 =     Answer:   4 mg     Order Specific Question:   CIWA-AR > 24 =     Answer:   Call Rapid Response    LORazepam (ATIVAN) tablet 0-4 mg     Order Specific Question:   CIWA-AR < 5 =     Answer:   0 mg     Order Specific Question:   CIWA-AR 5 -10 =     Answer:   1 mg     Order Specific Question:   CIWA-AR 11 -15 =     Answer:   2 mg     Order Specific Question:   CIWA-AR 16 -24 =     Answer:   2 mg     Order Specific Question:   CIWA-AR 16 -24 =     Answer:   Recheck CIWA-AR in 1 hour; if > 15 notify MD     Order Specific Question:   CIWA-AR > 24 =     Answer:   4 mg     Order Specific Question:   CIWA-AR > 24 =     Answer:   Call Rapid Response   OR Linked Order Group    thiamine tablet 100 mg    thiamine (B-1) injection 100 mg   sodium chloride 0.9 % bolus 1,000 mL   thiamine (B-1) injection 500 mg    I have reviewed the patients home medicines and have made adjustments as needed  Problem List / ED Course: Problem List Items Addressed This Visit   None Visit  Diagnoses     Alcohol intoxication with delirium (HCC)    -  Primary   Ataxia                       Final Clinical Impression(s) / ED Diagnoses Final diagnoses:  Alcohol intoxication with delirium (HCC)  Ataxia    Rx / DC Orders ED Discharge Orders     None         Shon Baton, MD 12/31/21 [phone]

## 2021-12-31 NOTE — H&P (Signed)
History and Physical    Patient: Gary [name] DOB: Mar 15, 1986 DOA: 12/31/2021 DOS: the patient was seen and examined on 12/31/2021 PCP: Pcp, No  Patient coming from: Home  Chief Complaint:  Chief Complaint  Patient presents with   Hallucinations    Visual and auditory; ETOH abuse    HPI: Gary [name] is a 36 y.o. male with medical history significant of alcohol abuse, tobacco abuse, polysubstance abuse, HIV disease not on treatment, history of subdural hematoma, history of seizures, treatment noncompliance who is coming to the emergency department due to suicidal ideation and alcohol withdrawal with visual/auditory hallucinations.  He stated earlier that he was seeing clowns.  He stated that he drinks at least 2/5 of liquor daily.  He has been having abdominal pain associated with nausea, but no emesis, diarrhea, constipation, melena or hematochezia.  Denied flank pain, dysuria, frequency or hematuria.  No rhinorrhea, sore throat, wheezing, dyspnea or hemoptysis.  No chest pain, palpitations, diaphoresis, or lower extremity edema.  No polyuria, polydipsia, polyphagia or blurry vision.  ED course: Initial vital signs were temperature 97.7 F, pulse 84, respiration 21, BP 111/82 mmHg and O2 sat 97% on room air.  The patient was started on lorazepam per CIWA protocol.  Lab work: CBC showed a white count of 3.6, hemoglobin 14.4 g/dL and platelets 97.  Troponin x2 was normal.  ethyl alcohol was 482 mg/dL.  CMP showed a calcium of 8.5 mg/dL, AST of 182 ALT 102 units/L.  Imaging: One-view portable chest radiograph did not show any active disease.  MRI of brain did not show any acute intracranial pathology or evidence of Warnicke encephalopathy.  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  There is also mucosal thickening in the maxillary sinuses which is new since November 2022.  Review of Systems: As mentioned in the history of present illness. All other  systems reviewed and are negative. Past Medical History:  Diagnosis Date   Alcohol abuse    HIV (human immunodeficiency virus infection) ([place])    Seizures ([place])    Subdural hematoma    Past Surgical History:  Procedure Laterality Date   INCISION AND DRAINAGE PERIRECTAL ABSCESS N/A 09/24/2016   Procedure: IRRIGATION AND DEBRIDEMENT PERIRECTAL ABSCESS;  Surgeon: Clayburn Pert, MD;  Location: [facility] ORS;  Service: General;  Laterality: N/A;   none     Social History:  reports that he has been smoking cigarettes. He has been smoking an average of .5 packs per day. He has never used smokeless tobacco. He reports current alcohol use. He reports current drug use. Drugs: Methamphetamines and Marijuana.  Allergies  Allergen Reactions   Tegretol [Carbamazepine] Other (See Comments)    Caused vertigo for 2 days after taking it   Caffeine Palpitations    Family History  Problem Relation Age of Onset   Alcohol abuse Mother    Alcohol abuse Father     Prior to Admission medications   Medication Sig Start Date End Date Taking? Authorizing Provider  bictegravir-emtricitabine-tenofovir AF (BIKTARVY) 50-200-25 MG TABS tablet Take 1 tablet by mouth daily. Patient not taking: Reported on 09/22/2021 08/31/21   Revonda Humphrey, NP  chlordiazePOXIDE (LIBRIUM) 25 MG capsule 50mg  PO TID x 1D, then 25-50mg  PO BID X 1D, then 25-50mg  PO QD X 1D Patient not taking: Reported on 09/22/2021 09/06/21   Sherrill Raring, PA-C  famotidine (PEPCID) 20 MG tablet Take 1 tablet (20 mg total) by mouth 2 (two) times daily. Patient not taking:  Reported on 06/01/2019 05/14/19 06/02/19  Davonna Belling, MD    Physical Exam: Vitals:   12/31/21 0815 12/31/21 0938 12/31/21 1000 12/31/21 1100  BP:  122/69 123/79 118/75  Pulse: 96 97 91 89  Resp:  (!) 22 17 18   Temp:  98.6 F (37 C)    TempSrc:  Oral    SpO2:  93% 95% 95%  Weight:  68.5 kg    Height:  6' (1.829 m)     Physical Exam HENT:     Head: Normocephalic.      Mouth/Throat:     Mouth: Mucous membranes are moist.  Eyes:     Pupils: Pupils are equal, round, and reactive to light.  Cardiovascular:     Rate and Rhythm: Regular rhythm. Tachycardia present.  Pulmonary:     Effort: Pulmonary effort is normal.     Breath sounds: Normal breath sounds.  Abdominal:     General: Bowel sounds are normal. There is no distension.     Palpations: Abdomen is soft.     Tenderness: There is no abdominal tenderness.  Musculoskeletal:     Cervical back: Neck supple.     Right lower leg: No edema.     Left lower leg: No edema.  Skin:    General: Skin is warm and dry.  Neurological:     General: No focal deficit present.     Mental Status: He is alert and oriented to person, place, and time.     Cranial Nerves: No cranial nerve deficit.     Sensory: Sensation is intact.     Motor: Tremor present.  Psychiatric:        Mood and Affect: Mood is anxious.        Behavior: Behavior is cooperative.     Data Reviewed:  There are no new results to review at this time.  Assessment and Plan: Principal Problem:   Alcohol withdrawal ([place]) With earlier   Alcohol intoxication with delirium ([place]) Observation/stepdown. Continue folate, MVI and thiamine. Magnesium sulfate 2 g IVPB x1 given. Lorazepam administration per CIWA protocol. Haloperidol 5 mg IVP every 6 hours as needed. Consider Precedex infusion if lorazepam not effective.  Active Problems:   Suicidal ideation One-to-one observation. Behavioral health to evaluate.    HIV disease ([place]) Currently not on medical therapy. Follow-up with PCP or ID as an outpatient.    Cigarette smoker Nicotine replacement therapy as needed. Tobacco cessation advised.    Thrombocytopenia (HCC) Due to alcohol consumption. Alcohol cessation advised. Monitor platelet count.    Transaminitis Secondary to alcohol abuse. Monitor ALT/AST.    Neutropenia (HCC) Monitor WBC.    Macrocytosis Low normal B12 level  in the past. Continue folic acid replacement. Begin daily B12 supplementation.     Advance Care Planning:   Code Status: Full Code   Consults: Behavioral health.  Family Communication:   Severity of Illness: The appropriate patient status for this patient is OBSERVATION. Observation status is judged to be reasonable and necessary in order to provide the required intensity of service to ensure the patient's safety. The patient's presenting symptoms, physical exam findings, and initial radiographic and laboratory data in the context of their medical condition is felt to place them at decreased risk for further clinical deterioration. Furthermore, it is anticipated that the patient will be medically stable for discharge from the hospital within 2 midnights of admission.   Author: Reubin Milan, MD 12/31/2021 12:41 PM  For on call review [email or web address].  This document was prepared using [employer] and may contain some unintended transcription errors.

## 2021-12-31 NOTE — Consult Note (Signed)
Gary [name] is a 36 year old male seen face-to-face for this psychiatric provider, consult with Dr. Lucianne Muss.  Chart reviewed on February 22.  He is well known to the psychiatric service , With multiple inpatient hospitalizations due to acute alcohol intoxication, encephalopathy, alcohol withdrawal, and delirium tremens.  Historically patient has had to receive Precedex and phenobarbital and requiring intensive care admission.  On evaluation today patient appears to be emaciated, deconditioned.  He does present with some disorientation, confusion, and difficulty staying awake but able to talk and answer some questions.  He is observed to have some tremors and involuntary movement of his upper extremities.  He continues to endorse heavy drinking, blood alcohol level on this admission 482.  As with previous admissions patient does endorse suicidal ideations and homicidal ideations.  He states his plan to commit suicide is to drink himself to death.  However in the same sentence he states he wants to get himself together he cannot do it alone.  Patient currently is not receiving any outpatient psychiatric services, despite multiple inpatient hospitalizations.  Patient appears to be willing and motivated to complete alcohol cessation and stop drinking, however his repeated admissions show otherwise.    Patient stating that he is suicidal and unable to contract for safety stating that he will drink himself to death if he is unable to get help.  Patient currently reporting symptoms from withdrawal.    Patient reporting he is interested in detox and rehab.    During evaluation [name] is sitting up in bed in no acute distress.  He is alert, oriented x 2, calm and cooperative.  His mood is dysphoric, anxious with congruent affect.  He does not appear to be responding to internal/external stimuli or delusional thoughts.  Patient denies homicidal ideation, psychosis, and paranoia; patient continues to  endorse suicidal ideation if he is unable to get help with alcohol detox and rehab.    Patient has a history of frequent ED visits requesting assistance with alcohol detox and rehab service but has also frequently refused referrals and has been noncompliant with follow up.    -At this present time we will recommend inpatient dual diagnosis programming for depression and alcohol abuse.  Patient will need to be medically stable prior to psychiatric hospitalization. -Psychiatry will continue to follow at this time. -Patient with history of severe delirium tremens, agitation, aggression.  Will likely require Precedex treatment and/or phenobarbital.  Will add Haldol for management of alcohol intoxication and withdrawal symptoms. -Continue current CIWA protocol and Ativan detox -

## 2021-12-31 NOTE — ED Triage Notes (Signed)
Pt brought in by ems for c/o visual and auditory hallucinations; pt has been drinking ETOH; pt c/o chest pain with tremors states he has not eat in 4 days; pt admits to drinking 1/5 of vodka;   BP 130/92 P 94 O2% 99  CBG 95  Pt claiming to see clowns; pt states he does not want to leave, he needs help; pt admits to SI; pt states he wants to drink himself to death; pt states he started to drink today but his friends call ems; pt c/o hurting all over and his chest

## 2021-12-31 NOTE — ED Notes (Signed)
Attempted to walk pt; pt unable to stand for extended amount of time; pt up with unsteady gait; pt c/o feeling dizzy and states his toes hurt

## 2022-01-01 DIAGNOSIS — F10939 Alcohol use, unspecified with withdrawal, unspecified: Secondary | ICD-10-CM | POA: Diagnosis present

## 2022-01-01 DIAGNOSIS — F10932 Alcohol use, unspecified with withdrawal with perceptual disturbance: Secondary | ICD-10-CM | POA: Diagnosis present

## 2022-01-01 LAB — COMPREHENSIVE METABOLIC PANEL
ALT: 74 U/L — ABNORMAL HIGH (ref 0–44)
AST: 88 U/L — ABNORMAL HIGH (ref 15–41)
Albumin: 3.6 g/dL (ref 3.5–5.0)
Alkaline Phosphatase: 46 U/L (ref 38–126)
Anion gap: 6 (ref 5–15)
BUN: 6 mg/dL (ref 6–20)
CO2: 25 mmol/L (ref 22–32)
Calcium: 9 mg/dL (ref 8.9–10.3)
Chloride: 102 mmol/L (ref 98–111)
Creatinine, Ser: 0.44 mg/dL — ABNORMAL LOW (ref 0.61–1.24)
GFR, Estimated: >60 mL/min (ref >60)
Glucose, Bld: 96 mg/dL (ref 70–99)
Potassium: 3.5 mmol/L (ref 3.5–5.1)
Sodium: 133 mmol/L — ABNORMAL LOW (ref 135–145)
Total Bilirubin: 1 mg/dL (ref 0.3–1.2)
Total Protein: 6.6 g/dL (ref 6.5–8.1)

## 2022-01-01 LAB — CBC
HCT: 38.3 % — ABNORMAL LOW (ref 39.0–52.0)
Hemoglobin: 13.1 g/dL (ref 13.0–17.0)
MCH: 35.2 pg — ABNORMAL HIGH (ref 26.0–34.0)
MCHC: 34.2 g/dL (ref 30.0–36.0)
MCV: 103 fL — ABNORMAL HIGH (ref 80.0–100.0)
Platelets: 70 10*3/uL — ABNORMAL LOW (ref 150–400)
RBC: 3.72 MIL/uL — ABNORMAL LOW (ref 4.22–5.81)
RDW: 13.8 % (ref 11.5–15.5)
WBC: 2.7 10*3/uL — ABNORMAL LOW (ref 4.0–10.5)
nRBC: 0 % (ref 0.0–0.2)

## 2022-01-01 LAB — LIPASE, BLOOD: Lipase: 84 U/L — ABNORMAL HIGH (ref 11–51)

## 2022-01-01 MED ORDER — DEXMEDETOMIDINE HCL IN NACL 400 MCG/100ML IV SOLN
INTRAVENOUS | Status: AC
  Filled 2022-01-01: qty 100

## 2022-01-01 MED ORDER — DEXMEDETOMIDINE HCL IN NACL 200 MCG/50ML IV SOLN
0.2000 ug/kg/h | INTRAVENOUS | Status: DC
  Administered 2022-01-01 – 2022-01-02 (×2): 0.496 ug/kg/h via INTRAVENOUS
  Filled 2022-01-01: qty 50

## 2022-01-01 NOTE — Progress Notes (Signed)
Noted trending increase in CWIAand patient medicated per scale. Noted pt is c/o generalized pain and movements are noted to be restless. Pt educated on current treatment plan and goals to keep him comfortable. Shared with pt if pain management seems inadequate to make RN aware so we can get it under control. Pt expressed a thank you and stated " I appreciate that no  one ask if Im ever hurting"Oncology going assessment.

## 2022-01-01 NOTE — Consult Note (Signed)
[facility] Face-to-Face Psychiatry Consult   Reason for Consult: Alcohol Problem Referring Physician: Dr. Jonathon Bellows Patient Identification: Gary [name] MRN:  [id number] Principal Diagnosis: Alcohol abuse with withdrawal [facility]) Diagnosis:  Principal Problem:   Alcohol abuse with withdrawal (HCC) Active Problems:   HIV disease (HCC)   Cigarette smoker   Alcohol intoxication with delirium (HCC)   Thrombocytopenia (HCC)   Transaminitis   Suicidal ideation   Neutropenia (HCC)   Macrocytosis   Alcohol withdrawal (HCC)   Total Time spent with patient: 30 minutes  Subjective: "I am ready to go. Can I leave now? "    Gary [name] is a 36 y.o. male patient presented to [facility] requesting alcohol detox and patient is voluntary. He is well known to the psychiatric service, With multiple inpatient hospitalizations due to acute alcohol intoxication, encephalopathy, alcohol withdrawal, and delirium tremens.  Historically patient has had to receive Precedex and phenobarbital and requiring intensive care admission.The patient alcohol level on 02.21.23 is 482 mg/dl.  Patient initially on admission did endorse vague suicidal ideations with a plan to drink himself to death.  Historically this has always been his plan to drink himself to death, he does present with severely elevated levels of blood alcohol and at times has required Precedex and phenobarbital for treatment and management of his condition.   On evaluation, the patient is alert and oriented x4, irritable, cooperative, and mood-congruent with affect.  At times he does display some confusion, however is able to engage in linear conversation.  Patient does lack insight into need for ongoing medical treatment and poor judgment as he makes an attempt to get out of bed.  Discussed with patient his current liver enzymes and other metabolic elements that are abnormal, and he will need to remain in the hospital at this time even though he is declining inpatient  psychiatric services.  The patient does not appear to be responding to internal or external stimuli. The patient is presenting with some delusional thinking. The patient denies auditory or visual hallucinations. The patient denies suicidal ideations and reports this is usually due to him drinking alcohol and when under the influence.  Patient also declines any inpatient, detox, and or rehabilitation for alcohol use at this time.  When asked what changed his mind "he just states he is simply ready to go.  No way in hell ongoing inpatient."  Discussed with patient he is currently not medically cleared at this time, as he makes an attempt to get out of the bed and leave.  Patient has not been able to ambulate without assistance and has been very unsteady on his feet.    Denies SI HI or hallucinations at the present time.  To me he does not express interest in detox after I brought it up and tried to engage about past detox history and current motivation   Past Psychiatric History:  Alcohol abuse  Risk to Self:  Yes Risk to Others:   No Prior Inpatient Therapy:  Yes Prior Outpatient Therapy:  Yes  Past Medical History:  Past Medical History:  Diagnosis Date   Alcohol abuse    HIV (human immunodeficiency virus infection) (HCC)    Seizures (HCC)    Subdural hematoma     Past Surgical History:  Procedure Laterality Date   INCISION AND DRAINAGE PERIRECTAL ABSCESS N/A 09/24/2016   Procedure: IRRIGATION AND DEBRIDEMENT PERIRECTAL ABSCESS;  Surgeon: Ricarda Frame, MD;  Location: [facility] ORS;  Service: General;  Laterality: N/A;   none  Family History:  Family History  Problem Relation Age of Onset   Alcohol abuse Mother    Alcohol abuse Father    Family Psychiatric  History: Unknown Social History:  Social History   Substance and Sexual Activity  Alcohol Use Yes   Comment: "I drink all I can"     Social History   Substance and Sexual Activity  Drug Use Yes   Types:  Methamphetamines, Marijuana   Comment: last used 04/10/2019    Social History   Socioeconomic History   Marital status: Unknown    Spouse name: Not on file   Number of children: Not on file   Years of education: Not on file   Highest education level: Not on file  Occupational History   Occupation: unemployed  Tobacco Use   Smoking status: Every Day    Packs/day: 0.50    Types: Cigarettes   Smokeless tobacco: Never   Tobacco comments:    unable to smoke while incarcerated 6+ months 02/13/20  Vaping Use   Vaping Use: Never used  Substance and Sexual Activity   Alcohol use: Yes    Comment: "I drink all I can"   Drug use: Yes    Types: Methamphetamines, Marijuana    Comment: last used 04/10/2019   Sexual activity: Yes    Partners: Male, Male    Comment: declined condoms  03/2021  Other Topics Concern   Not on file  Social History Narrative   "Currently living on the streets"   Independent at baseline   Social Determinants of Health   Financial Resource Strain: Not on file  Food Insecurity: Not on file  Transportation Needs: Not on file  Physical Activity: Not on file  Stress: Not on file  Social Connections: Not on file   Additional Social History:    Allergies:   Allergies  Allergen Reactions   Tegretol [Carbamazepine] Other (See Comments)    Caused vertigo for 2 days after taking it   Caffeine Palpitations    Labs:  Results for orders placed or performed during the hospital encounter of 12/31/21 (from the past 48 hour(s))  CBC with Differential     Status: Abnormal   Collection Time: 12/31/21 12:33 AM  Result Value Ref Range   WBC 3.6 (L) 4.0 - 10.5 K/uL   RBC 4.12 (L) 4.22 - 5.81 MIL/uL   Hemoglobin 14.4 13.0 - 17.0 g/dL   HCT 16.141.8 09.639.0 - 04.552.0 %   MCV 101.5 (H) 80.0 - 100.0 fL   MCH 35.0 (H) 26.0 - 34.0 pg   MCHC 34.4 30.0 - 36.0 g/dL   RDW 40.914.6 81.111.5 - 91.415.5 %   Platelets 97 (L) 150 - 400 K/uL    Comment: Immature Platelet Fraction may be clinically  indicated, consider ordering this additional test [id number]    nRBC 0.0 0.0 - 0.2 %   Neutrophils Relative % 42 %   Neutro Abs 1.5 (L) 1.7 - 7.7 K/uL   Lymphocytes Relative 48 %   Lymphs Abs 1.7 0.7 - 4.0 K/uL   Monocytes Relative 6 %   Monocytes Absolute 0.2 0.1 - 1.0 K/uL   Eosinophils Relative 3 %   Eosinophils Absolute 0.1 0.0 - 0.5 K/uL   Basophils Relative 1 %   Basophils Absolute 0.1 0.0 - 0.1 K/uL   Immature Granulocytes 0 %   Abs Immature Granulocytes 0.01 0.00 - 0.07 K/uL    Comment: Performed at [facility], [address] [place], [place]  [address]  Comprehensive metabolic panel     Status: Abnormal   Collection Time: 12/31/21 12:33 AM  Result Value Ref Range   Sodium 137 135 - 145 mmol/L   Potassium 3.8 3.5 - 5.1 mmol/L   Chloride 102 98 - 111 mmol/L   CO2 23 22 - 32 mmol/L   Glucose, Bld 84 70 - 99 mg/dL    Comment: Glucose reference range applies only to samples taken after fasting for at least 8 hours.   BUN 12 6 - 20 mg/dL   Creatinine, Ser 6.04 (L) 0.61 - 1.24 mg/dL   Calcium 8.5 (L) 8.9 - 10.3 mg/dL   Total Protein 7.6 6.5 - 8.1 g/dL   Albumin 4.2 3.5 - 5.0 g/dL   AST 540 (H) 15 - 41 U/L   ALT 102 (H) 0 - 44 U/L   Alkaline Phosphatase 54 38 - 126 U/L   Total Bilirubin 0.7 0.3 - 1.2 mg/dL   GFR, Estimated >98 >11 mL/min    Comment: (NOTE) Calculated using the CKD-EPI Creatinine Equation (2021)    Anion gap 12 5 - 15    Comment: Performed at [facility], [address] [place], [place] [address]  Ethanol     Status: Abnormal   Collection Time: 12/31/21 12:33 AM  Result Value Ref Range   Alcohol, Ethyl (B) 482 (HH) <10 mg/dL    Comment: CRITICAL RESULT CALLED TO, READ BACK BY AND VERIFIED WITH: JENNIFER SMITH RN.@[id number]  ON 2.22.23 BY TCALDWELL MT. (NOTE) Lowest detectable limit for serum alcohol is 10 mg/dL.  For medical purposes only. Performed at [facility], [address], [place], [place] [address]   Troponin I (High Sensitivity)     Status: None   Collection Time: 12/31/21 12:33 AM  Result Value Ref Range   Troponin I (High Sensitivity) 10 <18 ng/L    Comment: (NOTE) Elevated high sensitivity troponin I (hsTnI) values and significant  changes across serial measurements may suggest ACS but many other  chronic and acute conditions are known to elevate hsTnI results.  Refer to the "Links" section for chest pain algorithms and additional  guidance. Performed at [facility], [address], [place], [place] [address]   Lipase, blood     Status: Abnormal   Collection Time: 12/31/21 12:33 AM  Result Value Ref Range   Lipase 116 (H) 11 - 51 U/L    Comment: Performed at [facility], [address] [place], [place] [address]  Resp Panel by RT-PCR (Flu A&B, Covid) Nasopharyngeal Swab     Status: None   Collection Time: 12/31/21  8:06 AM   Specimen: Nasopharyngeal Swab; Nasopharyngeal(NP) swabs in vial transport medium  Result Value Ref Range   SARS Coronavirus 2 by RT PCR NEGATIVE NEGATIVE    Comment: (NOTE) SARS-CoV-2 target nucleic acids are NOT DETECTED.  The SARS-CoV-2 RNA is generally detectable in upper respiratory specimens during the acute phase of infection. The lowest concentration of SARS-CoV-2 viral copies this assay can detect is 138 copies/mL. A negative result does not preclude SARS-Cov-2 infection and should not be used as the sole basis for treatment or other patient management decisions. A negative result may occur with  improper specimen collection/handling, submission of specimen other than nasopharyngeal swab, presence of viral mutation(s) within the areas targeted by this assay, and inadequate number of viral copies(<138 copies/mL). A negative result must be combined with clinical observations, patient history, and epidemiological information. The expected  result is Negative.  Fact Sheet  for Patients:  BloggerCourse.com  Fact Sheet for Healthcare Providers:  SeriousBroker.it  This test is no t yet approved or cleared by the [place] FDA and  has been authorized for detection and/or diagnosis of SARS-CoV-2 by FDA under an Emergency Use Authorization (EUA). This EUA will remain  in effect (meaning this test can be used) for the duration of the COVID-19 declaration under Section 564(b)(1) of the Act, 21 U.S.C.section 360bbb-3(b)(1), unless the authorization is terminated  or revoked sooner.       Influenza A by PCR NEGATIVE NEGATIVE   Influenza B by PCR NEGATIVE NEGATIVE    Comment: (NOTE) The Xpert Xpress SARS-CoV-2/FLU/RSV plus assay is intended as an aid in the diagnosis of influenza from Nasopharyngeal swab specimens and should not be used as a sole basis for treatment. Nasal washings and aspirates are unacceptable for Xpert Xpress SARS-CoV-2/FLU/RSV testing.  Fact Sheet for Patients: BloggerCourse.com  Fact Sheet for Healthcare Providers: SeriousBroker.it  This test is not yet approved or cleared by the [place] FDA and has been authorized for detection and/or diagnosis of SARS-CoV-2 by FDA under an Emergency Use Authorization (EUA). This EUA will remain in effect (meaning this test can be used) for the duration of the COVID-19 declaration under Section 564(b)(1) of the Act, 21 U.S.C. section 360bbb-3(b)(1), unless the authorization is terminated or revoked.  Performed at [facility], [address], [place], [place] [address]   Troponin I (High Sensitivity)     Status: None   Collection Time: 12/31/21  8:08 AM  Result Value Ref Range   Troponin I (High Sensitivity) 13 <18 ng/L    Comment: (NOTE) Elevated high sensitivity troponin I (hsTnI) values and significant  changes across serial measurements may suggest ACS but many  other  chronic and acute conditions are known to elevate hsTnI results.  Refer to the "Links" section for chest pain algorithms and additional  guidance. Performed at [facility], [address] [place], [place] [address]   MRSA Next Gen by PCR, Nasal     Status: None   Collection Time: 12/31/21  9:46 AM   Specimen: Nasal Mucosa; Nasal Swab  Result Value Ref Range   MRSA by PCR Next Gen NOT DETECTED NOT DETECTED    Comment: (NOTE) The GeneXpert MRSA Assay (FDA approved for NASAL specimens only), is one component of a comprehensive MRSA colonization surveillance program. It is not intended to diagnose MRSA infection nor to guide or monitor treatment for MRSA infections. Test performance is not FDA approved in patients less than 2 years old. Performed at [facility], [address] [place], [place] [address]   Urine rapid drug screen (hosp performed)     Status: Abnormal   Collection Time: 12/31/21 10:18 AM  Result Value Ref Range   Opiates NONE DETECTED NONE DETECTED   Cocaine NONE DETECTED NONE DETECTED   Benzodiazepines POSITIVE (A) NONE DETECTED   Amphetamines NONE DETECTED NONE DETECTED   Tetrahydrocannabinol NONE DETECTED NONE DETECTED   Barbiturates NONE DETECTED NONE DETECTED    Comment: (NOTE) DRUG SCREEN FOR MEDICAL PURPOSES ONLY.  IF CONFIRMATION IS NEEDED FOR ANY PURPOSE, NOTIFY LAB WITHIN 5 DAYS.  LOWEST DETECTABLE LIMITS FOR URINE DRUG SCREEN Drug Class                     Cutoff (ng/mL) Amphetamine and metabolites    1000 Barbiturate and metabolites  200 Benzodiazepine                 200 Tricyclics and metabolites     300 Opiates and metabolites        300 Cocaine and metabolites        300 THC                            50 Performed at [facility], [address], [place], [place] [address]   TSH     Status: None   Collection Time: 12/31/21 10:44 AM  Result Value Ref Range   TSH 3.238  0.350 - 4.500 uIU/mL    Comment: Performed by a 3rd Generation assay with a functional sensitivity of <=0.01 uIU/mL. Performed at [facility], [address], [place], [place] [address]   Acetaminophen level     Status: Abnormal   Collection Time: 12/31/21 10:44 AM  Result Value Ref Range   Acetaminophen (Tylenol), Serum <10 (L) 10 - 30 ug/mL    Comment: (NOTE) Therapeutic concentrations vary significantly. A range of 10-30 ug/mL  may be an effective concentration for many patients. However, some  are best treated at concentrations outside of this range. Acetaminophen concentrations >150 ug/mL at 4 hours after ingestion  and >50 ug/mL at 12 hours after ingestion are often associated with  toxic reactions.  Performed at [facility], [address], [place], [place] [address]   Salicylate level     Status: Abnormal   Collection Time: 12/31/21 10:44 AM  Result Value Ref Range   Salicylate Lvl <7.0 (L) 7.0 - 30.0 mg/dL    Comment: Performed at [facility], [address] [place], [place] [address]  CBC     Status: Abnormal   Collection Time: 12/31/21 10:44 AM  Result Value Ref Range   WBC 2.8 (L) 4.0 - 10.5 K/uL   RBC 3.67 (L) 4.22 - 5.81 MIL/uL   Hemoglobin 12.7 (L) 13.0 - 17.0 g/dL   HCT 96.2 (L) 95.2 - 84.1 %   MCV 102.7 (H) 80.0 - 100.0 fL   MCH 34.6 (H) 26.0 - 34.0 pg   MCHC 33.7 30.0 - 36.0 g/dL   RDW 32.4 40.1 - 02.7 %   Platelets 81 (L) 150 - 400 K/uL    Comment: Immature Platelet Fraction may be clinically indicated, consider ordering this additional test [id number] CONSISTENT WITH PREVIOUS RESULT    nRBC 0.0 0.0 - 0.2 %    Comment: Performed at [facility], [address] [place], [place] [address]  Comprehensive metabolic panel     Status: Abnormal   Collection Time: 12/31/21 10:44 AM  Result Value Ref Range   Sodium 138 135 - 145 mmol/L   Potassium 3.6 3.5 - 5.1 mmol/L   Chloride  104 98 - 111 mmol/L   CO2 25 22 - 32 mmol/L   Glucose, Bld 95 70 - 99 mg/dL    Comment: Glucose reference range applies only to samples taken after fasting for at least 8 hours.   BUN 9 6 - 20 mg/dL   Creatinine, Ser 5.95 (L) 0.61 - 1.24 mg/dL   Calcium 8.1 (L) 8.9 - 10.3 mg/dL   Total Protein 6.7 6.5 - 8.1 g/dL   Albumin 3.5 3.5 - 5.0 g/dL   AST 638 (H) 15 - 41 U/L   ALT 85 (H) 0 - 44 U/L  Alkaline Phosphatase 44 38 - 126 U/L   Total Bilirubin 0.6 0.3 - 1.2 mg/dL   GFR, Estimated >54>60 >09>60 mL/min    Comment: (NOTE) Calculated using the CKD-EPI Creatinine Equation (2021)    Anion gap 9 5 - 15    Comment: Performed at [facility], [address] [place], [place] [address]  Protime-INR     Status: None   Collection Time: 12/31/21 10:44 AM  Result Value Ref Range   Prothrombin Time 13.1 11.4 - 15.2 seconds   INR 1.0 0.8 - 1.2    Comment: (NOTE) INR goal varies based on device and disease states. Performed at [facility], [address] [place], [place] [address]   Magnesium     Status: None   Collection Time: 12/31/21 10:44 AM  Result Value Ref Range   Magnesium 1.8 1.7 - 2.4 mg/dL    Comment: Performed at [facility], [address] [place], [place] [address]  Phosphorus     Status: None   Collection Time: 12/31/21 10:44 AM  Result Value Ref Range   Phosphorus 4.5 2.5 - 4.6 mg/dL    Comment: Performed at [facility], [address] [place], [place] [address]  Vitamin B12     Status: None   Collection Time: 12/31/21 10:44 AM  Result Value Ref Range   Vitamin B-12 306 180 - 914 pg/mL    Comment: (NOTE) This assay is not validated for testing neonatal or myeloproliferative syndrome specimens for Vitamin B12 levels. Performed at [facility], [address] [place], [place] [address]   Folate, serum, performed at [facility] lab     Status: None   Collection Time:  12/31/21 10:44 AM  Result Value Ref Range   Folate 6.5 >5.9 ng/mL    Comment: Performed at [facility], [address] [place], [place] [address]  CK     Status: None   Collection Time: 12/31/21 10:45 AM  Result Value Ref Range   Total CK 221 49 - 397 U/L    Comment: Performed at [facility], [address] [place], [place] [address]  CBC     Status: Abnormal   Collection Time: 01/01/22  2:35 AM  Result Value Ref Range   WBC 2.7 (L) 4.0 - 10.5 K/uL   RBC 3.72 (L) 4.22 - 5.81 MIL/uL   Hemoglobin 13.1 13.0 - 17.0 g/dL   HCT 40.138.3 (L) 02.739.0 - 25.352.0 %   MCV 103.0 (H) 80.0 - 100.0 fL   MCH 35.2 (H) 26.0 - 34.0 pg   MCHC 34.2 30.0 - 36.0 g/dL   RDW 66.413.8 40.311.5 - 47.415.5 %   Platelets 70 (L) 150 - 400 K/uL    Comment: Immature Platelet Fraction may be clinically indicated, consider ordering this additional test [id number]    nRBC 0.0 0.0 - 0.2 %    Comment: Performed at [facility], [address] [place], [place] [address]  Comprehensive metabolic panel     Status: Abnormal   Collection Time: 01/01/22  2:35 AM  Result Value Ref Range   Sodium 133 (L) 135 - 145 mmol/L   Potassium 3.5 3.5 - 5.1 mmol/L   Chloride 102 98 - 111 mmol/L   CO2 25 22 - 32 mmol/L   Glucose, Bld 96 70 - 99 mg/dL    Comment: Glucose reference range applies only to samples taken after fasting for at least  8 hours.   BUN 6 6 - 20 mg/dL   Creatinine, Ser 1.61 (L) 0.61 - 1.24 mg/dL   Calcium 9.0 8.9 - 09.6 mg/dL   Total Protein 6.6 6.5 - 8.1 g/dL   Albumin 3.6 3.5 - 5.0 g/dL   AST 88 (H) 15 - 41 U/L   ALT 74 (H) 0 - 44 U/L   Alkaline Phosphatase 46 38 - 126 U/L   Total Bilirubin 1.0 0.3 - 1.2 mg/dL   GFR, Estimated >04 >54 mL/min    Comment: (NOTE) Calculated using the CKD-EPI Creatinine Equation (2021)    Anion gap 6 5 - 15    Comment: Performed at [facility], [address], [place], [place] [address]  Lipase, blood     Status:  Abnormal   Collection Time: 01/01/22  2:35 AM  Result Value Ref Range   Lipase 84 (H) 11 - 51 U/L    Comment: Performed at [facility], [address], [place], [place] [address]    Current Facility-Administered Medications  Medication Dose Route Frequency Provider Last Rate Last Admin   haloperidol (HALDOL) tablet 5 mg  5 mg Oral Q6H PRN Maryagnes Amos, FNP   5 mg at 12/31/21 2144   And   benztropine (COGENTIN) tablet 1 mg  1 mg Oral Q6H PRN Maryagnes Amos, FNP   1 mg at 01/01/22 0019   Chlorhexidine Gluconate Cloth 2 % PADS 6 each  6 each Topical Q0600 Bobette Mo, MD   6 each at 12/31/21 0946   dextrose 5 % and 0.9 % NaCl with KCl 20 mEq/L infusion   Intravenous Continuous Bobette Mo, MD 100 mL/hr at 01/01/22 0755 New Bag at 01/01/22 0755   folic acid (FOLVITE) tablet 1 mg  1 mg Oral Daily Bobette Mo, MD   1 mg at 01/01/22 1041   LORazepam (ATIVAN) injection 0-4 mg  0-4 mg Intravenous Q4H Bobette Mo, MD   2 mg at 01/01/22 1207   Followed by   Melene Muller ON 01/02/2022] LORazepam (ATIVAN) injection 0-4 mg  0-4 mg Intravenous Q8H Bobette Mo, MD       LORazepam (ATIVAN) tablet 1-4 mg  1-4 mg Oral Q1H PRN Bobette Mo, MD   2 mg at 12/31/21 2144   Or   LORazepam (ATIVAN) injection 1-4 mg  1-4 mg Intravenous Q1H PRN Bobette Mo, MD   4 mg at 01/01/22 0145   metoprolol tartrate (LOPRESSOR) injection 5 mg  5 mg Intravenous Q6H PRN Bobette Mo, MD       multivitamin with minerals tablet 1 tablet  1 tablet Oral Daily Bobette Mo, MD   1 tablet at 01/01/22 1041   ondansetron [facility]) tablet 4 mg  4 mg Oral Q6H PRN Bobette Mo, MD       Or   ondansetron [facility]) injection 4 mg  4 mg Intravenous Q6H PRN Bobette Mo, MD       thiamine tablet 100 mg  100 mg Oral Daily Bobette Mo, MD   100 mg at 01/01/22 1041   Or   thiamine (B-1) injection 100 mg  100 mg Intravenous Daily  Bobette Mo, MD       vitamin B-12 (CYANOCOBALAMIN) tablet 1,000 mcg  1,000 mcg Oral Daily Bobette Mo, MD   1,000 mcg at 01/01/22 1041    Musculoskeletal: Strength & Muscle Tone: decreased Gait & Station: unsteady Patient  leans: N/A  Psychiatric Specialty Exam: Physical Exam Vitals and nursing note reviewed.  Constitutional:      General: He is in acute distress.  HENT:     Right Ear: External ear normal.     Left Ear: External ear normal.     Nose: Nose normal.     Mouth/Throat:     Mouth: Mucous membranes are dry.  Cardiovascular:     Rate and Rhythm: Tachycardia present.  Pulmonary:     Effort: Pulmonary effort is normal.  Musculoskeletal:        General: Normal range of motion.     Cervical back: Normal range of motion and neck supple.  Neurological:     Mental Status: He is alert and oriented to person, place, and time.  Psychiatric:        Mood and Affect: Affect is blunt, flat and inappropriate.        Speech: Speech is delayed.        Behavior: Behavior is slowed.        Thought Content: Thought content includes suicidal ideation.        Cognition and Memory: Memory normal.        Judgment: Judgment is inappropriate.    Review of Systems  Psychiatric/Behavioral:  Positive for behavioral problems and suicidal ideas. The patient is nervous/anxious.   All other systems reviewed and are negative.  Blood pressure 127/82, pulse 86, temperature 98.4 F (36.9 C), temperature source Oral, resp. rate 17, height 6' (1.829 m), weight 68.5 kg, SpO2 94 %.Body mass index is 20.48 kg/m.  General Appearance: Disheveled  Eye Contact:  Fair  Speech:  Slow  Volume:  Decreased  Mood:  Irritable  Affect:  Blunt and Constricted  Thought Process:  Coherent, Linear, and Descriptions of Associations: Tangential  Orientation:  Full (Time, Place, and Person)  Thought Content:  Tangential  Suicidal Thoughts:  No  Homicidal Thoughts:  No  Memory:  Immediate;    Fair Recent;   Fair Remote;   Fair  Judgement:  Poor  Insight:  Lacking  Psychomotor Activity:  Normal  Concentration:  Concentration: Fair and Attention Span: Fair  Recall:  [employer] of Knowledge:  Good  Language:  Poor  Akathisia:  Negative  Handed:  Right  AIMS (if indicated):     Assets:  Communication Skills Desire for Improvement Financial Resources/Insurance Physical Health Resilience Social Support  ADL's:  Intact  Cognition:  WNL  Sleep:      The patient is at severe elevated risk of dangerousness to self and others ;further worsening of psychiatric conditions as he continues to detox, history of DTs. Risk factors for suicide include:current treatment non-compliance and confusion. Risk factors for violence for this patient include:male gender, history of acts of violence in current setting, lack of insight and judgement, and previous history of violence. Protective factors for this patient are limited YQ:IHKVQQVZD/GLOVFIEPP belief system and supportive family/friends. This patient does meet [place] involuntary commitment criteria at this time.     Given the patient's presentation at the time of this assessment and considering the above noted risk and protective factors, in my clinical judgment the patient's current risk potential for suicidal behavior is low acutely and low chronically, and their current risk potential for harm to others is low.  Will psychiatrically clear patient, as he expressed no interest in additional psychiatric services.  However he is not medically cleared at this time and continue to recommend ongoing treatment in  intensive care unit to prevent delirium tremens.   Treatment Plan Summary: Medication management and Plan The patient is no longer a safety risk to himself and declines inpatient psychiatric inpatient admission for stabilization and treatment.   -Continue with current treatment and medication regimen. -If patient attempts to leave  while still receiving medical services and care, will need to be placed under involuntary commitment. -Consider TOC consult for transitional housing to include sober living of [place], if patient is interested.    Disposition: No evidence of imminent risk to self or others at present.   Patient does not meet criteria for psychiatric inpatient admission. Supportive therapy provided about ongoing stressors. Refer to IOP. Discussed crisis plan, support from social network, calling 911, coming to the Emergency Department, and calling Suicide Hotline.  Maryagnes Amos, FNP 01/01/2022 2:28 PM

## 2022-01-01 NOTE — Assessment & Plan Note (Addendum)
Due to his alcohol abuse, AST/ALT mildly elevated but stable.  Trend.

## 2022-01-01 NOTE — Progress Notes (Signed)
PROGRESS NOTE Gary [name]  NGE:[id number] DOB: Oct 27, 1986 DOA: 12/31/2021 PCP: Pcp, No   Brief Narrative/Hospital Course: 36 y.o. male with medical history significant of alcohol abuse, tobacco abuse, polysubstance abuse, HIV disease not on treatment, history of subdural hematoma, history of seizures, treatment noncompliance p/w suicidal ideation and alcohol withdrawal with visual/auditory hallucinations,was seeing clowns. Reports he drinks at least 2/5 of liquor daily. C/o abdominal pain associated with nausea, but no emesis, diarrhea, constipation, melena or hematochezia. In ed: Initial vital signs stable afebrile, started on lorazepam per CIWA protocol, labs- CBC leukopenia, hemoconcentration, thrombocytopenia- wbc 3.6, hb 14.4 g/dL and platelets 97.  Troponin x2 was normal.  ethyl alcohol was 482 mg/dL.  lipase  84, ast/alt 182/102, UDS positive for benzo.MRI brain findings that may represent age advanced chronic white matter microangiopathy, migraines, prior infection/inflammation, among other etiologies  Patient admitted for further management psychiatry was consulted.    Subjective: Seen and examined.  Sitter at the bedside. Patient appears flushed reducibility, alert awake oriented to self/place/month and year but not to date, appears tremulous. Overnight patient has been getting Ativan and Haldol for alcohol withdrawal management-CIWA ranging from 9-15 Afebrile, rest of vitals stable.  Assessment and Plan: * Alcohol abuse with withdrawal (HCC)- (present on admission) Alcohol intoxication-last EtOH 2/20 2 AM Alcohol withdrawal delirium/hallucination: Continue with current detox protocol with CIWA protocol, Haldol, MVI, thiamine folate and monitor closely in stepdown unit.Has a history of severe withdrawal, currently stable may need phenobarbital/Precedex-if current therapy not effective.  Previous history of noncompliance with detox or rehab at this time willing to go through it,  psychiatry following closely.   Neutropenia (HCC)- (present on admission) Leukopenia Thrombocytopenia: Suspect due to patient's chronic alcoholism.  Monitor CBC closely. Recent Labs  Lab 12/31/21 0033 12/31/21 1044 01/01/22 0235  HGB 14.4 12.7* 13.1  HCT 41.8 37.7* 38.3*  WBC 3.6* 2.8* 2.7*  PLT 97* 81* 70*    Transaminitis- (present on admission) AST/ALT are elevated slightly better.Suspect patient's alcohol abuse mediated.Trend  Macrocytosis- (present on admission) From alcohol abuse  Suicidal ideation Psychiatry on board.  Continue suicide precaution.  Defer to psychiatry  Cigarette smoker- (present on admission) Cessation counseling  HIV disease (HCC)- (present on admission) Currently not on medical therapy.  Last CD4 215 CD8 44 Seen by PCP/Whitney 10/2021- per note " She spoke to ID and was advised to reestablish HART therapy by following with ID as OP- and ID did not recommend continuing Hart therapy if patient has not been compliant"   DVT prophylaxis: SCDs Start: 12/31/21 0732.  No chemical prophylaxis due to thrombocytopenia Code Status:   Code Status: Full Code Family Communication: plan of care discussed with patient.  Disposition: Currently not medically stable for discharge. Status is: Admitted as observation The patient will require care spanning > 2 midnights and should be moved to inpatient because: For ongoing management of alcohol withdrawal   Objective: Vitals last 24 hrs: Vitals:   01/01/22 0500 01/01/22 0600 01/01/22 0700 01/01/22 0734  BP: 105/76 117/72 125/90   Pulse: 67 78 76 67  Resp:      Temp:    98.4 F (36.9 C)  TempSrc:    Oral  SpO2: 93% 95% 99%   Weight:      Height:       Weight change: 0.46 kg  Physical Examination: General exam: AA0X self/place/month and year, not to date,older than stated age, weak appearing. HEENT:Oral mucosa moist, Ear/Nose WNL grossly, dentition normal. Respiratory system: bilaterally clear BS,  no  use of accessory muscle Cardiovascular system: S1 & S2 +, No JVD,. Gastrointestinal system: Abdomen soft,NT,ND, BS+ Nervous System:Alert, awake, moving extremities and grossly nonfocal Extremities: LE edema none,distal peripheral pulses palpable.  Skin: No rashes,no icterus.  Flushed skin. MSK: Normal muscle bulk,tone, power  Medications reviewed:  Scheduled Meds:  Chlorhexidine Gluconate Cloth  6 each Topical Q0600   folic acid  1 mg Oral Daily   LORazepam  0-4 mg Intravenous Q4H   Followed by   Melene Muller ON 01/02/2022] LORazepam  0-4 mg Intravenous Q8H   multivitamin with minerals  1 tablet Oral Daily   thiamine  100 mg Oral Daily   Or   thiamine  100 mg Intravenous Daily   vitamin B-12  1,000 mcg Oral Daily   Continuous Infusions:  dextrose 5 % and 0.9 % NaCl with KCl 20 mEq/L 100 mL/hr at 01/01/22 8937      Diet Order             Diet regular Room service appropriate? Yes; Fluid consistency: Thin  Diet effective now                   Intake/Output Summary (Last 24 hours) at 01/01/2022 1020 Last data filed at 01/01/2022 3428 Gross per 24 hour  Intake 510.16 ml  Output 2600 ml  Net -2089.84 ml   Net IO Since Admission: -1,089.84 mL [01/01/22 1020]  Wt Readings from Last 3 Encounters:  12/31/21 68.5 kg  11/08/21 74.4 kg  09/30/21 70.3 kg     Unresulted Labs (From admission, onward)     Start     Ordered   01/02/22 0500  Lipase, blood  Tomorrow morning,   R       Question:  Specimen collection method  Answer:  Lab=Lab collect   01/01/22 0727   01/02/22 0500  Comprehensive metabolic panel  Daily,   R     Question:  Specimen collection method  Answer:  Lab=Lab collect   01/01/22 0727   01/02/22 0500  CBC  Daily,   R     Question:  Specimen collection method  Answer:  Lab=Lab collect   01/01/22 0727   12/31/21 0637  Vitamin B1  Once,   STAT        12/31/21 7681           Data Reviewed: I have personally reviewed following labs and imaging  studies CBC: Recent Labs  Lab 12/31/21 0033 12/31/21 1044 01/01/22 0235  WBC 3.6* 2.8* 2.7*  NEUTROABS 1.5*  --   --   HGB 14.4 12.7* 13.1  HCT 41.8 37.7* 38.3*  MCV 101.5* 102.7* 103.0*  PLT 97* 81* 70*   Basic Metabolic Panel: Recent Labs  Lab 12/31/21 0033 12/31/21 1044 01/01/22 0235  NA 137 138 133*  K 3.8 3.6 3.5  CL 102 104 102  CO2 23 25 25   GLUCOSE 84 95 96  BUN 12 9 6   CREATININE 0.57* 0.54* 0.44*  CALCIUM 8.5* 8.1* 9.0  MG  --  1.8  --   PHOS  --  4.5  --    GFR: Estimated Creatinine Clearance: 124.9 mL/min (A) (by C-G formula based on SCr of 0.44 mg/dL (L)). Liver Function Tests: Recent Labs  Lab 12/31/21 0033 12/31/21 1044 01/01/22 0235  AST 182* 129* 88*  ALT 102* 85* 74*  ALKPHOS 54 44 46  BILITOT 0.7 0.6 1.0  PROT 7.6 6.7 6.6  ALBUMIN 4.2 3.5 3.6   Recent  Labs  Lab 12/31/21 0033 01/01/22 0235  LIPASE 116* 84*   No results for input(s): AMMONIA in the last 168 hours. Coagulation Profile: Recent Labs  Lab 12/31/21 1044  INR 1.0   Cardiac Enzymes: Recent Labs  Lab 12/31/21 1045  CKTOTAL 221   BNP (last 3 results) No results for input(s): PROBNP in the last 8760 hours. HbA1C: No results for input(s): HGBA1C in the last 72 hours. CBG: No results for input(s): GLUCAP in the last 168 hours. Lipid Profile: No results for input(s): CHOL, HDL, LDLCALC, TRIG, CHOLHDL, LDLDIRECT in the last 72 hours. Thyroid Function Tests: Recent Labs    12/31/21 1044  TSH 3.238   Anemia Panel: Recent Labs    12/31/21 1044  VITAMINB12 306  FOLATE 6.5   Sepsis Labs: No results for input(s): PROCALCITON, LATICACIDVEN in the last 168 hours.  Recent Results (from the past 240 hour(s))  Resp Panel by RT-PCR (Flu A&B, Covid) Nasopharyngeal Swab     Status: None   Collection Time: 12/31/21  8:06 AM   Specimen: Nasopharyngeal Swab; Nasopharyngeal(NP) swabs in vial transport medium  Result Value Ref Range Status   SARS Coronavirus 2 by RT PCR  NEGATIVE NEGATIVE Final    Comment: (NOTE) SARS-CoV-2 target nucleic acids are NOT DETECTED.  The SARS-CoV-2 RNA is generally detectable in upper respiratory specimens during the acute phase of infection. The lowest concentration of SARS-CoV-2 viral copies this assay can detect is 138 copies/mL. A negative result does not preclude SARS-Cov-2 infection and should not be used as the sole basis for treatment or other patient management decisions. A negative result may occur with  improper specimen collection/handling, submission of specimen other than nasopharyngeal swab, presence of viral mutation(s) within the areas targeted by this assay, and inadequate number of viral copies(<138 copies/mL). A negative result must be combined with clinical observations, patient history, and epidemiological information. The expected result is Negative.  Fact Sheet for Patients:  BloggerCourse.comhttps://www.fda.gov/media/152166/download  Fact Sheet for Healthcare Providers:  SeriousBroker.ithttps://www.fda.gov/media/152162/download  This test is no t yet approved or cleared by the [place] FDA and  has been authorized for detection and/or diagnosis of SARS-CoV-2 by FDA under an Emergency Use Authorization (EUA). This EUA will remain  in effect (meaning this test can be used) for the duration of the COVID-19 declaration under Section 564(b)(1) of the Act, 21 U.S.C.section 360bbb-3(b)(1), unless the authorization is terminated  or revoked sooner.       Influenza A by PCR NEGATIVE NEGATIVE Final   Influenza B by PCR NEGATIVE NEGATIVE Final    Comment: (NOTE) The Xpert Xpress SARS-CoV-2/FLU/RSV plus assay is intended as an aid in the diagnosis of influenza from Nasopharyngeal swab specimens and should not be used as a sole basis for treatment. Nasal washings and aspirates are unacceptable for Xpert Xpress SARS-CoV-2/FLU/RSV testing.  Fact Sheet for Patients: BloggerCourse.comhttps://www.fda.gov/media/152166/download  Fact Sheet for  Healthcare Providers: SeriousBroker.ithttps://www.fda.gov/media/152162/download  This test is not yet approved or cleared by the [place] FDA and has been authorized for detection and/or diagnosis of SARS-CoV-2 by FDA under an Emergency Use Authorization (EUA). This EUA will remain in effect (meaning this test can be used) for the duration of the COVID-19 declaration under Section 564(b)(1) of the Act, 21 U.S.C. section 360bbb-3(b)(1), unless the authorization is terminated or revoked.  Performed at [facility], [address] [place], [place] [address]   MRSA Next Gen by PCR, Nasal     Status: None   Collection Time: 12/31/21  9:46 AM   Specimen: Nasal Mucosa; Nasal Swab  Result Value Ref Range Status   MRSA by PCR Next Gen NOT DETECTED NOT DETECTED Final    Comment: (NOTE) The GeneXpert MRSA Assay (FDA approved for NASAL specimens only), is one component of a comprehensive MRSA colonization surveillance program. It is not intended to diagnose MRSA infection nor to guide or monitor treatment for MRSA infections. Test performance is not FDA approved in patients less than 63 years old. Performed at [facility], [address], [place], [place] [address]     Antimicrobials: Anti-infectives (From admission, onward)    None      Culture/Microbiology Radiology Studies: MR BRAIN WO CONTRAST  Result Date: 12/31/2021 CLINICAL DATA:  Altered mental status, concern for Wernicke's EXAM: MRI HEAD WITHOUT CONTRAST TECHNIQUE: Multiplanar, multiecho pulse sequences of the brain and surrounding structures were obtained without intravenous contrast. COMPARISON:  CT head 09/26/2021 FINDINGS: Brain: There is no evidence of acute intracranial hemorrhage, extra-axial fluid collection, or acute infarct. Background parenchymal volume is normal. The ventricles are normal in size. There are scattered foci of FLAIR signal abnormality in the subcortical and periventricular  white matter, advanced for age. There is no periventricular signal abnormality around the third ventricle. The periaqueductal gray matter is normal. There is no definite signal abnormality in the mamillary bodies, though these are suboptimally assessed. There is no mass lesion.  There is no midline shift. Vascular: Normal flow voids. Skull and upper cervical spine: Normal marrow signal. Sinuses/Orbits: There is extensive mucosal thickening in the bilateral maxillary sinuses, new since the prior study. The globes and orbits are unremarkable. Other: None. IMPRESSION: 1. No acute intracranial pathology. No evidence of Wernicke's encephalopathy. 2. Scattered small foci of FLAIR signal abnormality in the subcortical and periventricular white matter are nonspecific but are advanced for age. Finding may reflect age advanced chronic white matter microangiopathy, migraines, prior infection/inflammation, among other etiologies. 3. Extensive mucosal thickening in the maxillary sinuses, new since the prior study from November 2022. Electronically Signed   By: Lesia Hausen M.D.   On: 12/31/2021 09:20   DG Chest Portable 1 View  Result Date: 12/31/2021 CLINICAL DATA:  Chest pain and shortness of breath. EXAM: PORTABLE CHEST 1 VIEW COMPARISON:  July 25, 2021 FINDINGS: The heart size and mediastinal contours are within normal limits. Low lung volumes are noted with mild elevation of the left hemidiaphragm. Both lungs are otherwise clear. The visualized skeletal structures are unremarkable. IMPRESSION: No active disease. Electronically Signed   By: Aram Candela M.D.   On: 12/31/2021 00:54     LOS: 0 days   Lanae Boast, MD [facility]  01/01/2022, 10:20 AM

## 2022-01-01 NOTE — Hospital Course (Addendum)
36 y.o. male with medical history significant of alcohol abuse, tobacco abuse, polysubstance abuse, HIV disease not on treatment, history of subdural hematoma, history of seizures, treatment noncompliance p/w suicidal ideation and alcohol withdrawal with visual/auditory hallucinations,was seeing clowns. Reports he drinks at least 2/5 of liquor daily. C/o abdominal pain associated with nausea, but no emesis, diarrhea, constipation, melena or hematochezia. In ed: Initial vital signs stable afebrile, started on lorazepam per CIWA protocol, labs- CBC leukopenia, hemoconcentration, thrombocytopenia- wbc 3.6, hb 14.4 g/dL and platelets 97.  Troponin x2 was normal.  ethyl alcohol was 482 mg/dL.  lipase  84, ast/alt 182/102, UDS positive for benzo.MRI brain findings that may represent age advanced chronic white matter microangiopathy, migraines, prior infection/inflammation, among other etiologies  Patient admitted for further management psychiatry was consulted. 2/23:Patient was placed under IVC given that he was having alcohol withdrawal and wanted to [facility], remains with one-to-one sitter. 2/24 CIWA was very high up to 33 and placed on Precedex drip-several days eventually weaned off 2/27 pm.At this time patient is clinically improving,

## 2022-01-01 NOTE — Assessment & Plan Note (Addendum)
From alcohol abuse.monitor.

## 2022-01-01 NOTE — Assessment & Plan Note (Addendum)
Alcohol intoxication-last EtOH 2/20 2 AM Alcohol withdrawal delirium/hallucination recurrent admission for alcohol withdrawal delirium and usually needs Precedex: 2/24 CIWA was very high up to 33 and placed on Precedex drip-PCCM managing-weaned off Precedex 2/20 7 PM, also on Ativan standing and CIWA. On thiamine/folate.  Needed 20 mg of Ativan past 24 hours, phenobarbital added today weaning of Ativan.  Overall much improved psychiatry evaluated, patient is alert awake oriented x3, able to make medical decisions and psychiatry rescinding IVC.I will allow for at least additional 1 day of treatment-wean off ativan.  Patient has been ambulating in the room to the bathroom without any issues.

## 2022-01-01 NOTE — Assessment & Plan Note (Addendum)
Currently not on medical therapy.Last CD4 215 CD8 44 Seen by PCP/Whitney 10/2021- per note " She spoke to ID and was advised to reestablish HART therapy by following with ID as OP- and ID did not recommend continuing Hart therapy if patient has not been compliant" he IS instructed to will follow-up with his infectious disease physician upon discharge

## 2022-01-01 NOTE — Assessment & Plan Note (Signed)
Cessation counseling 

## 2022-01-01 NOTE — Assessment & Plan Note (Addendum)
leukopenia Thrombocytopenia: Likely secondary to chronic alcoholism.  Overall stable.  Monitor. Recent Labs  Lab 01/03/22 0242 01/04/22 0303 01/06/22 0245  HGB 12.8* 13.5 15.2  HCT 38.7* 39.5 45.0  WBC 4.2 3.1* 3.1*  PLT 82* 107* 188

## 2022-01-01 NOTE — Plan of Care (Signed)
  Problem: Education: Goal: Knowledge of General Education information will improve Description Including pain rating scale, medication(s)/side effects and non-pharmacologic comfort measures Outcome: Progressing   

## 2022-01-01 NOTE — TOC Initial Note (Signed)
Transition of Care [facility]) - Initial/Assessment Note    Patient Details  Name: Gary [name] MRN: [id number] Date of Birth: 25-Aug-1986  [facility] CM/SW Contact:    Golda Acre, RN Phone Number: 01/01/2022, 8:04 AM  Clinical Narrative:                 Following for toc needs and possible ivc  Expected Discharge Plan: Homeless Shelter Barriers to Discharge: Continued Medical Work up   Patient Goals and CMS Choice Patient states their goals for this hospitalization and ongoing recovery are:: not stated CMS Medicare.gov Compare Post Acute Care list provided to:: Patient    Expected Discharge Plan and Services Expected Discharge Plan: Homeless Shelter   Discharge Planning Services: CM Consult   Living arrangements for the past 2 months: Homeless                                      Prior Living Arrangements/Services Living arrangements for the past 2 months: Homeless Lives with:: Self Patient language and need for interpreter reviewed:: Yes Do you feel safe going back to the place where you live?: Yes            Criminal Activity/Legal Involvement Pertinent to Current Situation/Hospitalization: No - Comment as needed  Activities of Daily Living Home Assistive Devices/Equipment: None ADL Screening (condition at time of admission) Patient's cognitive ability adequate to safely complete daily activities?: Yes Is the patient deaf or have difficulty hearing?: No Does the patient have difficulty seeing, even when wearing glasses/contacts?: No Does the patient have difficulty concentrating, remembering, or making decisions?: No Patient able to express need for assistance with ADLs?: Yes Does the patient have difficulty dressing or bathing?: No Independently performs ADLs?: Yes (appropriate for developmental age) Does the patient have difficulty walking or climbing stairs?: No Weakness of Legs: None Weakness of Arms/Hands: None  Permission  Sought/Granted                  Emotional Assessment Appearance:: Appears stated age     Orientation: : Fluctuating Orientation (Suspected and/or reported Sundowners) Alcohol / Substance Use: Alcohol Use, Illicit Drugs, Tobacco Use Psych Involvement: Yes (comment)  Admission diagnosis:  Alcohol withdrawal (HCC) [F10.939] Ataxia [R27.0] Alcohol intoxication with delirium (HCC) [F10.921] Patient Active Problem List   Diagnosis Date Noted   Macrocytosis 12/31/2021   Withdrawal symptoms, alcohol (HCC) 11/06/2021   Constipation 07/30/2021   Alcohol-induced insomnia (HCC)    MDD (major depressive disorder), recurrent episode, 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/26/2021   Neutropenia (HCC) 07/26/2021   Alcohol withdrawal seizure without complication (HCC)    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   Elevated troponin 10/19/2020   Alcohol abuse with withdrawal (HCC) 10/19/2020   Alcohol abuse    Suicidal ideation 06/14/2020   Pancreatitis 06/13/2020   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   Malnutrition of moderate degree 05/07/2020   Gastrointestinal hemorrhage    Alcoholic hepatitis without ascites    Melena 05/05/2020   Alcoholic ketoacidosis 05/05/2020   Thrombocytopenia (HCC) 07/25/2019   Transaminitis 07/25/2019   Traumatic subdural hematoma 07/02/2019   Alcohol intoxication with delirium (HCC) 07/02/2019   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

## 2022-01-01 NOTE — Assessment & Plan Note (Addendum)
suicidal ideation on admission seen by psychiatry and ON one-to-one suicide precaution. on IVC since 01/01/22.  Currently denies suicidal ideation homicidal ideation alert awake interactive appropriate.  Psychiatry rescinding IVC today.

## 2022-01-02 DIAGNOSIS — F10139 Alcohol abuse with withdrawal, unspecified: Secondary | ICD-10-CM

## 2022-01-02 LAB — CBC
HCT: 41.2 % (ref 39.0–52.0)
Hemoglobin: 13.8 g/dL (ref 13.0–17.0)
MCH: 34.7 pg — ABNORMAL HIGH (ref 26.0–34.0)
MCHC: 33.5 g/dL (ref 30.0–36.0)
MCV: 103.5 fL — ABNORMAL HIGH (ref 80.0–100.0)
Platelets: 72 10*3/uL — ABNORMAL LOW (ref 150–400)
RBC: 3.98 MIL/uL — ABNORMAL LOW (ref 4.22–5.81)
RDW: 13.7 % (ref 11.5–15.5)
WBC: 2.5 10*3/uL — ABNORMAL LOW (ref 4.0–10.5)
nRBC: 0 % (ref 0.0–0.2)

## 2022-01-02 LAB — COMPREHENSIVE METABOLIC PANEL
ALT: 69 U/L — ABNORMAL HIGH (ref 0–44)
AST: 70 U/L — ABNORMAL HIGH (ref 15–41)
Albumin: 3.8 g/dL (ref 3.5–5.0)
Alkaline Phosphatase: 51 U/L (ref 38–126)
Anion gap: 8 (ref 5–15)
BUN: 6 mg/dL (ref 6–20)
CO2: 22 mmol/L (ref 22–32)
Calcium: 9.3 mg/dL (ref 8.9–10.3)
Chloride: 105 mmol/L (ref 98–111)
Creatinine, Ser: 0.55 mg/dL — ABNORMAL LOW (ref 0.61–1.24)
GFR, Estimated: >60 mL/min (ref >60)
Glucose, Bld: 115 mg/dL — ABNORMAL HIGH (ref 70–99)
Potassium: 3.9 mmol/L (ref 3.5–5.1)
Sodium: 135 mmol/L (ref 135–145)
Total Bilirubin: 1 mg/dL (ref 0.3–1.2)
Total Protein: 7.2 g/dL (ref 6.5–8.1)

## 2022-01-02 LAB — VITAMIN B1: Vitamin B1 (Thiamine): 199.7 nmol/L (ref 66.5–200.0)

## 2022-01-02 LAB — LIPASE, BLOOD: Lipase: 57 U/L — ABNORMAL HIGH (ref 11–51)

## 2022-01-02 MED ORDER — HALOPERIDOL LACTATE 5 MG/ML IJ SOLN
5.0000 mg | Freq: Four times a day (QID) | INTRAMUSCULAR | Status: DC | PRN
  Filled 2022-01-02: qty 1

## 2022-01-02 MED ORDER — BENZTROPINE MESYLATE 0.5 MG PO TABS
1.0000 mg | ORAL_TABLET | Freq: Two times a day (BID) | ORAL | Status: DC | PRN
  Filled 2022-01-02: qty 2

## 2022-01-02 MED ORDER — BENZTROPINE MESYLATE 1 MG/ML IJ SOLN
1.0000 mg | Freq: Two times a day (BID) | INTRAMUSCULAR | Status: DC | PRN
  Filled 2022-01-02: qty 1

## 2022-01-02 MED ORDER — LORAZEPAM 2 MG/ML IJ SOLN
1.0000 mg | INTRAMUSCULAR | Status: DC | PRN
  Administered 2022-01-02 – 2022-01-03 (×2): 2 mg via INTRAVENOUS
  Filled 2022-01-02 (×2): qty 1

## 2022-01-02 MED ORDER — DEXMEDETOMIDINE HCL IN NACL 200 MCG/50ML IV SOLN
0.4000 ug/kg/h | INTRAVENOUS | Status: DC
  Administered 2022-01-02 (×2): 0.7 ug/kg/h via INTRAVENOUS
  Administered 2022-01-02: 0.4 ug/kg/h via INTRAVENOUS
  Administered 2022-01-03 (×2): 0.7 ug/kg/h via INTRAVENOUS
  Administered 2022-01-03: 0.4 ug/kg/h via INTRAVENOUS
  Administered 2022-01-03: 0.6 ug/kg/h via INTRAVENOUS
  Filled 2022-01-02 (×7): qty 50

## 2022-01-02 MED ORDER — HALOPERIDOL 1 MG PO TABS: 5.0000 mg | ORAL_TABLET | Freq: Three times a day (TID) | ORAL | Status: DC | PRN

## 2022-01-02 NOTE — Consult Note (Signed)
NAME:  Gary [name], MRN:  [id number], DOB:  06/08/1986, LOS: 1 ADMISSION DATE:  12/31/2021, CONSULTATION DATE:  01/02/2022 REFERRING MD:  Dr. Jonathon Bellows, CHIEF COMPLAINT: Alcohol withdrawal  History of Present Illness:  Gary [name] is a 37 year old male with a past medical history significant for bipolar disease, HIV, alcohol abuse, subdural hematoma and seizures who presented to the ED for complaints of suicidal ideation alcohol withdrawal with visual/auditory hallucinations.  He was admitted per [facility] for management of alcohol withdrawal and psychiatric consultation.  Afternoon of 2/24 patient began experiencing worsening alcohol withdrawal symptoms with CIWA elevated at 33.  This was accompanied with significant agitation and combativeness for which security was called.  Patient is currently IVC.  PCCM consulted for further management and need for Precedex drip  Pertinent  Medical History  Bipolar disease HIV Alcohol abuse Subdural hematoma Seizure  Significant Hospital Events: Including procedures, antibiotic start and stop dates in addition to other pertinent events   2/22 admitted for suicidal ideations and alcohol withdrawal 2/24 PCCM consulted for management of Precedex drip  Interim History / Subjective:  As above  Objective   Blood pressure 103/72, pulse 85, temperature 99.5 F (37.5 C), temperature source Oral, resp. rate 19, height 6' (1.829 m), weight 68.5 kg, SpO2 96 %.        Intake/Output Summary (Last 24 hours) at 01/02/2022 1640 Last data filed at 01/02/2022 0200 Gross per 24 hour  Intake 1226.5 ml  Output 1641 ml  Net -414.5 ml   Filed Weights   12/31/21 0025 12/31/21 0938  Weight: 68 kg 68.5 kg    Examination: General: Acute ill-appearing adult male lying in bed in mild distress secondary to alcohol withdrawal HEENT: [place]/AT, MM pink/moist, PERRL,  Neuro: Delirious CV: s1s2 regular rate and rhythm, no murmur, rubs, or gallops,  PULM: Mild tachypnea, no  increased work of breathing, no added breath sounds GI: soft, bowel sounds active in all 4 quadrants, non-tender, non-distended Extremities: warm/dry, no edema  Skin: no rashes or lesions  Resolved Hospital Problem list     Assessment & Plan:  Acute alcohol withdrawal with associated delirium/hallucinations -Patient is CIWA scale 33 afternoon of 2/24 P: Start Precedex drip Supplement thiamine, folate, multivitamin Seizure precautions Closely monitor ability to protect airway Alcohol cessation education when appropriate Monitor CIWA scales   Leukopenia Thrombocytopenia Macrocytosis P: Trend CBC Monitor fever curve next monitor for signs of bleeding  Transaminitis -Felt secondary to chronic alcohol use P: Avoid hepatotoxins Intermittently trend LFTs  Suicidal ideations P: Psychiatry consulted and patient is now IVC as of 2/23 Suicide sitter  History of HIV -Currently not on medical therapy, last CD4 count 214 P: Outpatient follow-up   Best Practice (right click and "Reselect all SmartList Selections" daily)   Diet/type: NPO DVT prophylaxis: SCD GI prophylaxis: N/A Lines: N/A Foley:  N/A Code Status:  full code Last date of multidisciplinary goals of care discussion: Pending   Labs   CBC: Recent Labs  Lab 12/31/21 0033 12/31/21 1044 01/01/22 0235 01/02/22 0230  WBC 3.6* 2.8* 2.7* 2.5*  NEUTROABS 1.5*  --   --   --   HGB 14.4 12.7* 13.1 13.8  HCT 41.8 37.7* 38.3* 41.2  MCV 101.5* 102.7* 103.0* 103.5*  PLT 97* 81* 70* 72*    Basic Metabolic Panel: Recent Labs  Lab 12/31/21 0033 12/31/21 1044 01/01/22 0235 01/02/22 0230  NA 137 138 133* 135  K 3.8 3.6 3.5 3.9  CL 102 104 102 105  CO2 23 25 25 22   GLUCOSE 84 95 96 115*  BUN 12 9 6 6   CREATININE 0.57* 0.54* 0.44* 0.55*  CALCIUM 8.5* 8.1* 9.0 9.3  MG  --  1.8  --   --   PHOS  --  4.5  --   --    GFR: Estimated Creatinine Clearance: 124.9 mL/min (A) (by C-G formula based on SCr of 0.55  mg/dL (L)). Recent Labs  Lab 12/31/21 0033 12/31/21 1044 01/01/22 0235 01/02/22 0230  WBC 3.6* 2.8* 2.7* 2.5*    Liver Function Tests: Recent Labs  Lab 12/31/21 0033 12/31/21 1044 01/01/22 0235 01/02/22 0230  AST 182* 129* 88* 70*  ALT 102* 85* 74* 69*  ALKPHOS 54 44 46 51  BILITOT 0.7 0.6 1.0 1.0  PROT 7.6 6.7 6.6 7.2  ALBUMIN 4.2 3.5 3.6 3.8   Recent Labs  Lab 12/31/21 0033 01/01/22 0235 01/02/22 0230  LIPASE 116* 84* 57*   No results for input(s): AMMONIA in the last 168 hours.  ABG    Component Value Date/Time   HCO3 27.1 02/12/2021 0056   TCO2 23 04/29/2021 2105   O2SAT 100.0 02/12/2021 0056     Coagulation Profile: Recent Labs  Lab 12/31/21 1044  INR 1.0    Cardiac Enzymes: Recent Labs  Lab 12/31/21 1045  CKTOTAL 221    HbA1C: Hgb A1c MFr Bld  Date/Time Value Ref Range Status  07/26/2021 06:53 AM 4.3 (L) 4.8 - 5.6 % Final    Comment:    (NOTE) Pre diabetes:          5.7%-6.4%  Diabetes:              >6.4%  Glycemic control for   <7.0% adults with diabetes   03/12/2021 03:57 AM 5.1 4.8 - 5.6 % Final    Comment:    (NOTE) Pre diabetes:          5.7%-6.4%  Diabetes:              >6.4%  Glycemic control for   <7.0% adults with diabetes     CBG: No results for input(s): GLUCAP in the last 168 hours.  Review of Systems:   Unable to assess  Past Medical History:  He,  has a past medical history of Alcohol abuse, HIV (human immunodeficiency virus infection) (HCC), Seizures (HCC), and Subdural hematoma.   Surgical History:   Past Surgical History:  Procedure Laterality Date   INCISION AND DRAINAGE PERIRECTAL ABSCESS N/A 09/24/2016   Procedure: IRRIGATION AND DEBRIDEMENT PERIRECTAL ABSCESS;  Surgeon: Ricarda Frame, MD;  Location: [facility] ORS;  Service: General;  Laterality: N/A;   none       Social History:   reports that he has been smoking cigarettes. He has been smoking an average of .5 packs per day. He has never used  smokeless tobacco. He reports current alcohol use. He reports current drug use. Drugs: Methamphetamines and Marijuana.   Family History:  His family history includes Alcohol abuse in his father and mother.   Allergies Allergies  Allergen Reactions   Tegretol [Carbamazepine] Other (See Comments)    Caused vertigo for 2 days after taking it   Caffeine Palpitations     Home Medications  Prior to Admission medications   Medication Sig Start Date End Date Taking? Authorizing Provider  bictegravir-emtricitabine-tenofovir AF (BIKTARVY) 50-200-25 MG TABS tablet Take 1 tablet by mouth daily. Patient not taking: Reported on 09/22/2021 08/31/21   Ardis Hughs, NP  chlordiazePOXIDE (  LIBRIUM) 25 MG capsule 50mg  PO TID x 1D, then 25-50mg  PO BID X 1D, then 25-50mg  PO QD X 1D Patient not taking: Reported on 09/22/2021 09/06/21   Theron Arista, PA-C  famotidine (PEPCID) 20 MG tablet Take 1 tablet (20 mg total) by mouth 2 (two) times daily. Patient not taking: Reported on 06/01/2019 05/14/19 06/02/19  Benjiman Core, MD     Critical care time:   CRITICAL CARE Performed by: Georgios Kina D. Harris  Total critical care time: 39 minutes  Critical care time was exclusive of separately billable procedures and treating other patients.  Critical care was necessary to treat or prevent imminent or life-threatening deterioration.  Critical care was time spent personally by me on the following activities: development of treatment plan with patient and/or surrogate as well as nursing, discussions with consultants, evaluation of patient's response to treatment, examination of patient, obtaining history from patient or surrogate, ordering and performing treatments and interventions, ordering and review of laboratory studies, ordering and review of radiographic studies, pulse oximetry and re-evaluation of patient's condition.  Keyvon Herter D. Tiburcio Pea, NP-C [facility] Personal contact information  can be found on Amion  01/02/2022, 4:55 PM

## 2022-01-02 NOTE — Assessment & Plan Note (Deleted)
See #1 °

## 2022-01-02 NOTE — Assessment & Plan Note (Signed)
See above

## 2022-01-02 NOTE — Progress Notes (Signed)
PROGRESS NOTE BRICE KOSSMAN  BZJ:[id number] DOB: [birth year] DOA: 12/31/2021 PCP: Pcp, No   Brief Narrative/Hospital Course: 36 y.o. male with medical history significant of alcohol abuse, tobacco abuse, polysubstance abuse, HIV disease not on treatment, history of subdural hematoma, history of seizures, treatment noncompliance p/w suicidal ideation and alcohol withdrawal with visual/auditory hallucinations,was seeing clowns. Reports he drinks at least 2/5 of liquor daily. C/o abdominal pain associated with nausea, but no emesis, diarrhea, constipation, melena or hematochezia. In ed: Initial vital signs stable afebrile, started on lorazepam per CIWA protocol, labs- CBC leukopenia, hemoconcentration, thrombocytopenia- wbc 3.6, hb 14.4 g/dL and platelets 97.  Troponin x2 was normal.  ethyl alcohol was 482 mg/dL.  lipase  84, ast/alt 182/102, UDS positive for benzo.MRI brain findings that may represent age advanced chronic white matter microangiopathy, migraines, prior infection/inflammation, among other etiologies  Patient admitted for further management psychiatry was consulted.    Subjective: Seen and examined,  Sitter at the bedside. Appears to red, flushed,tremulous alert awake Under IVC since 2/23  Assessment and Plan: * Alcohol abuse with withdrawal (HCC)- (present on admission) Alcohol intoxication-last EtOH 2/20 2 AM Alcohol withdrawal delirium/hallucination: Continue with current detox protocol with CIWA ativan, Haldol, MVI, thiamine folate. CIWA scale up to 24 2/22 and needing iv ativan, needed IVC(01/01/22)as he wanted to leave AMA per psych recommendations.Cont in stepdown unit.Has a history of severe withdrawal.Previous history of noncompliance with detox or rehab.   Alcohol intoxication with delirium (HCC) See above  Neutropenia (HCC)- (present on admission) Leukopenia Thrombocytopenia: Suspect due to patient's chronic alcoholism.  Monitor CBC closely. Recent Labs  Lab  12/31/21 1044 01/01/22 0235 01/02/22 0230  HGB 12.7* 13.1 13.8  HCT 37.7* 38.3* 41.2  WBC 2.8* 2.7* 2.5*  PLT 81* 70* 72*    Thrombocytopenia (HCC)- (present on admission) .  Transaminitis- (present on admission) AST/ALT improving, trend.Suspect 2/2 etoh use  Alcohol withdrawal (HCC)- (present on admission) .  Macrocytosis- (present on admission) From alcohol abuse  Suicidal ideation Continue suicide precaution. Now on IVC since 01/01/22.  Psychiatry following  Cigarette smoker- (present on admission) Cessation counseling  HIV disease (HCC)- (present on admission) Currently not on medical therapy.  Last CD4 215 CD8 44 Seen by PCP/Whitney 10/2021- per note " She spoke to ID and was advised to reestablish HART therapy by following with ID as OP- and ID did not recommend continuing Hart therapy if patient has not been compliant"   DVT prophylaxis: SCDs Start: 12/31/21 0732.  No chemical prophylaxis due to thrombocytopenia Code Status:   Code Status: Full Code Family Communication: plan of care discussed with patient.  Disposition: Currently not medically stable for discharge. Status is: Inpatient in the stepdown The patient will remain hospitalized for ongoing management of alcohol withdrawal suicidal ideation, under IVC since 2/23 will need a renewal in 7 days if still SI   Objective: Vitals last 24 hrs: Vitals:   01/02/22 0600 01/02/22 0700 01/02/22 0756 01/02/22 0807  BP: 106/71 92/64 (!) 90/56   Pulse:   66   Resp: 12 17    Temp:    98.6 F (37 C)  TempSrc:    Oral  SpO2:      Weight:      Height:       Weight change:   Physical Examination: General exam: AA0X2-3, reduced, tremulous older than stated age, weak appearing. HEENT:Oral mucosa moist, Ear/Nose WNL grossly, dentition normal. Respiratory system: bilaterally diminished, no use of accessory muscle Cardiovascular system: S1 & S2 +,  No JVD,. Gastrointestinal system: Abdomen soft,NT,ND,BS+ Nervous  System:Alert, awake, moving extremities and grossly nonfocal Extremities: LE ankle edema NONE, distal peripheral pulses palpable.  Skin: No rashes,no icterus. MSK: Normal muscle bulk,tone, power   Medications reviewed:  Scheduled Meds:  Chlorhexidine Gluconate Cloth  6 each Topical Q0600   folic acid  1 mg Oral Daily   LORazepam  0-4 mg Intravenous Q8H   multivitamin with minerals  1 tablet Oral Daily   thiamine  100 mg Oral Daily   Or   thiamine  100 mg Intravenous Daily   vitamin B-12  1,000 mcg Oral Daily   Continuous Infusions:  dextrose 5 % and 0.9 % NaCl with KCl 20 mEq/L 100 mL/hr at 01/02/22 0135      Diet Order             Diet regular Room service appropriate? Yes; Fluid consistency: Thin  Diet effective now                   Intake/Output Summary (Last 24 hours) at 01/02/2022 1008 Last data filed at 01/02/2022 0200 Gross per 24 hour  Intake 1746.5 ml  Output 1992 ml  Net -245.5 ml   Net IO Since Admission: 359.79 mL [01/02/22 1008]  Wt Readings from Last 3 Encounters:  12/31/21 68.5 kg  11/08/21 74.4 kg  09/30/21 70.3 kg     Unresulted Labs (From admission, onward)     Start     Ordered   01/02/22 0500  Comprehensive metabolic panel  Daily,   R     Question:  Specimen collection method  Answer:  Lab=Lab collect   01/01/22 0727   01/02/22 0500  CBC  Daily,   R     Question:  Specimen collection method  Answer:  Lab=Lab collect   01/01/22 0727           Data Reviewed: I have personally reviewed following labs and imaging studies CBC: Recent Labs  Lab 12/31/21 0033 12/31/21 1044 01/01/22 0235 01/02/22 0230  WBC 3.6* 2.8* 2.7* 2.5*  NEUTROABS 1.5*  --   --   --   HGB 14.4 12.7* 13.1 13.8  HCT 41.8 37.7* 38.3* 41.2  MCV 101.5* 102.7* 103.0* 103.5*  PLT 97* 81* 70* 72*   Basic Metabolic Panel: Recent Labs  Lab 12/31/21 0033 12/31/21 1044 01/01/22 0235 01/02/22 0230  NA 137 138 133* 135  K 3.8 3.6 3.5 3.9  CL 102 104 102 105   CO2 23 25 25 22   GLUCOSE 84 95 96 115*  BUN 12 9 6 6   CREATININE 0.57* 0.54* 0.44* 0.55*  CALCIUM 8.5* 8.1* 9.0 9.3  MG  --  1.8  --   --   PHOS  --  4.5  --   --    GFR: Estimated Creatinine Clearance: 124.9 mL/min (A) (by C-G formula based on SCr of 0.55 mg/dL (L)). Liver Function Tests: Recent Labs  Lab 12/31/21 0033 12/31/21 1044 01/01/22 0235 01/02/22 0230  AST 182* 129* 88* 70*  ALT 102* 85* 74* 69*  ALKPHOS 54 44 46 51  BILITOT 0.7 0.6 1.0 1.0  PROT 7.6 6.7 6.6 7.2  ALBUMIN 4.2 3.5 3.6 3.8   Recent Labs  Lab 12/31/21 0033 01/01/22 0235 01/02/22 0230  LIPASE 116* 84* 57*   No results for input(s): AMMONIA in the last 168 hours. Coagulation Profile: Recent Labs  Lab 12/31/21 1044  INR 1.0   Cardiac Enzymes: Recent Labs  Lab 12/31/21 1045  CKTOTAL 221   BNP (last 3 results) No results for input(s): PROBNP in the last 8760 hours. HbA1C: No results for input(s): HGBA1C in the last 72 hours. CBG: No results for input(s): GLUCAP in the last 168 hours. Lipid Profile: No results for input(s): CHOL, HDL, LDLCALC, TRIG, CHOLHDL, LDLDIRECT in the last 72 hours. Thyroid Function Tests: Recent Labs    12/31/21 1044  TSH 3.238   Anemia Panel: Recent Labs    12/31/21 1044  VITAMINB12 306  FOLATE 6.5   Sepsis Labs: No results for input(s): PROCALCITON, LATICACIDVEN in the last 168 hours.  Recent Results (from the past 240 hour(s))  Resp Panel by RT-PCR (Flu A&B, Covid) Nasopharyngeal Swab     Status: None   Collection Time: 12/31/21  8:06 AM   Specimen: Nasopharyngeal Swab; Nasopharyngeal(NP) swabs in vial transport medium  Result Value Ref Range Status   SARS Coronavirus 2 by RT PCR NEGATIVE NEGATIVE Final    Comment: (NOTE) SARS-CoV-2 target nucleic acids are NOT DETECTED.  The SARS-CoV-2 RNA is generally detectable in upper respiratory specimens during the acute phase of infection. The lowest concentration of SARS-CoV-2 viral copies this assay  can detect is 138 copies/mL. A negative result does not preclude SARS-Cov-2 infection and should not be used as the sole basis for treatment or other patient management decisions. A negative result may occur with  improper specimen collection/handling, submission of specimen other than nasopharyngeal swab, presence of viral mutation(s) within the areas targeted by this assay, and inadequate number of viral copies(<138 copies/mL). A negative result must be combined with clinical observations, patient history, and epidemiological information. The expected result is Negative.  Fact Sheet for Patients:  BloggerCourse.comhttps://www.fda.gov/media/152166/download  Fact Sheet for Healthcare Providers:  SeriousBroker.ithttps://www.fda.gov/media/152162/download  This test is no t yet approved or cleared by the [place] FDA and  has been authorized for detection and/or diagnosis of SARS-CoV-2 by FDA under an Emergency Use Authorization (EUA). This EUA will remain  in effect (meaning this test can be used) for the duration of the COVID-19 declaration under Section 564(b)(1) of the Act, 21 U.S.C.section 360bbb-3(b)(1), unless the authorization is terminated  or revoked sooner.       Influenza A by PCR NEGATIVE NEGATIVE Final   Influenza B by PCR NEGATIVE NEGATIVE Final    Comment: (NOTE) The Xpert Xpress SARS-CoV-2/FLU/RSV plus assay is intended as an aid in the diagnosis of influenza from Nasopharyngeal swab specimens and should not be used as a sole basis for treatment. Nasal washings and aspirates are unacceptable for Xpert Xpress SARS-CoV-2/FLU/RSV testing.  Fact Sheet for Patients: BloggerCourse.comhttps://www.fda.gov/media/152166/download  Fact Sheet for Healthcare Providers: SeriousBroker.ithttps://www.fda.gov/media/152162/download  This test is not yet approved or cleared by the [place] FDA and has been authorized for detection and/or diagnosis of SARS-CoV-2 by FDA under an Emergency Use Authorization (EUA). This EUA will  remain in effect (meaning this test can be used) for the duration of the COVID-19 declaration under Section 564(b)(1) of the Act, 21 U.S.C. section 360bbb-3(b)(1), unless the authorization is terminated or revoked.  Performed at [facility], [address] [place], [place] [id number]   MRSA Next Gen by PCR, Nasal     Status: None   Collection Time: 12/31/21  9:46 AM   Specimen: Nasal Mucosa; Nasal Swab  Result Value Ref Range Status   MRSA by PCR Next Gen NOT DETECTED NOT DETECTED Final    Comment: (NOTE) The GeneXpert MRSA Assay (FDA approved for NASAL specimens only), is one  component of a comprehensive MRSA colonization surveillance program. It is not intended to diagnose MRSA infection nor to guide or monitor treatment for MRSA infections. Test performance is not FDA approved in patients less than 82 years old. Performed at [facility], [address], [place], [place] [address]     Antimicrobials: Anti-infectives (From admission, onward)    None      Culture/Microbiology Radiology Studies: No results found.   LOS: 1 day   Lanae Boast, MD [facility]  01/02/2022, 10:08 AM

## 2022-01-02 NOTE — Progress Notes (Signed)
Upon entering patients good, patient appeared very anxious, requesting to leave AMA.   I explained to the patient that he was still exhibiting withdrawal symptoms, the patient continued to become more and more anxious/ agitated. Patient stated, "I will kill myself in this hospital if i'm not allowed to leave" . Patient also expressed he wished to leave so he could go drink with his friends.   When using CIWA scale patient score was 33, MD Ramesh notified. 4mg  IV ativan administered.    Will continue to monitor

## 2022-01-02 NOTE — Consult Note (Addendum)
°  36 y.o. male with medical history significant of alcohol abuse, tobacco abuse, polysubstance abuse, HIV disease not on treatment, history of subdural hematoma, history of seizures, treatment noncompliance p/w suicidal ideation and alcohol withdrawal with visual/auditory hallucinations,was seeing clowns. Reports he drinks at least 2/5 of liquor daily. C/o abdominal pain associated with nausea, but no emesis, diarrhea, constipation, melena or hematochezia.  Patient is being followed by psychiatry for alcohol abuse withdrawal and history of delirium tremens.  Gary [name] was admitted under routine precautions his detox is overall largely successful, we are on day (3) of detox. He is being treated with ativan and ciwa protocol.  CIWA score is average 14-22, patient has been normotensive throughout the day.  He continues to present with flushed skin, ongoing urges, cravings, and withdrawal symptoms.  He endorses sweating of the palms, flushed skin, and tremors.  He also endorses craving stating  "as soon as I hit the fucking door ongoing get me a drink.  Soon as you let me out this bitch. "Patient continues to lack insight at this time as he does not understand the negative impacts, effects alcohol withdrawal and delirium tremors have on him.  Delirium tremens historically setting and within 3 to 4 days from the last drink, so will expect symptoms to become worse at any rate within the next 12 to 24 hours.  He did voice being held against his will and previous experience in which law enforcement was called as he would not leave emergency room premises. "  They will put me in a wheelchair and pushed me outside, when I was sick throwing up everywhere and begging for help for detox.  And now you will not let me go so I can get a drink."  Nurse practitioner offered sympathy for his previous experience, however patient continues to lack insight that he remains unsteady and currently not yet medically stable to discharge from the  hospital.  He always denies suicidal thoughts when I interviewed him denied homicidal thoughts and could contract fully at the point of discharge.   -Will continue alcohol detox protocol at this time.  Patient continues to endorse pain, cravings, tremors, and withdrawal symptoms that are debilitating.  He also continues to have elevated CIWA scores, and will continue to need inpatient medical services.   -Will add Haldol and Cogentin for alcohol intoxication, agitation, tremors.  -Patient continues to remain a danger to self, as he lacks insight and judgment into his alcohol use severe disorder and possibility of delirium tremors that is a medical emergency. -Psychiatry will continue to follow at this time.  We will continue to assess daily, to determine if patient needs inpatient psychiatric hospitalization once medically stable. -

## 2022-01-02 NOTE — Assessment & Plan Note (Addendum)
See above

## 2022-01-03 DIAGNOSIS — R45851 Suicidal ideations: Secondary | ICD-10-CM

## 2022-01-03 LAB — CBC
HCT: 38.7 % — ABNORMAL LOW (ref 39.0–52.0)
Hemoglobin: 12.8 g/dL — ABNORMAL LOW (ref 13.0–17.0)
MCH: 34.7 pg — ABNORMAL HIGH (ref 26.0–34.0)
MCHC: 33.1 g/dL (ref 30.0–36.0)
MCV: 104.9 fL — ABNORMAL HIGH (ref 80.0–100.0)
Platelets: 82 10*3/uL — ABNORMAL LOW (ref 150–400)
RBC: 3.69 MIL/uL — ABNORMAL LOW (ref 4.22–5.81)
RDW: 13.7 % (ref 11.5–15.5)
WBC: 4.2 10*3/uL (ref 4.0–10.5)
nRBC: 0 % (ref 0.0–0.2)

## 2022-01-03 LAB — COMPREHENSIVE METABOLIC PANEL
ALT: 80 U/L — ABNORMAL HIGH (ref 0–44)
AST: 73 U/L — ABNORMAL HIGH (ref 15–41)
Albumin: 3.5 g/dL (ref 3.5–5.0)
Alkaline Phosphatase: 52 U/L (ref 38–126)
Anion gap: 5 (ref 5–15)
BUN: 12 mg/dL (ref 6–20)
CO2: 24 mmol/L (ref 22–32)
Calcium: 8.8 mg/dL — ABNORMAL LOW (ref 8.9–10.3)
Chloride: 105 mmol/L (ref 98–111)
Creatinine, Ser: 0.63 mg/dL (ref 0.61–1.24)
GFR, Estimated: >60 mL/min (ref >60)
Glucose, Bld: 113 mg/dL — ABNORMAL HIGH (ref 70–99)
Potassium: 4 mmol/L (ref 3.5–5.1)
Sodium: 134 mmol/L — ABNORMAL LOW (ref 135–145)
Total Bilirubin: 0.8 mg/dL (ref 0.3–1.2)
Total Protein: 6.7 g/dL (ref 6.5–8.1)

## 2022-01-03 MED ORDER — DEXMEDETOMIDINE HCL IN NACL 400 MCG/100ML IV SOLN
0.4000 ug/kg/h | INTRAVENOUS | Status: DC
  Administered 2022-01-03: 0.9 ug/kg/h via INTRAVENOUS
  Administered 2022-01-04: 0.8 ug/kg/h via INTRAVENOUS
  Administered 2022-01-04: 1 ug/kg/h via INTRAVENOUS
  Administered 2022-01-04: 0.9 ug/kg/h via INTRAVENOUS
  Administered 2022-01-04: 1 ug/kg/h via INTRAVENOUS
  Administered 2022-01-05: 0.4 ug/kg/h via INTRAVENOUS
  Administered 2022-01-05: 0.8 ug/kg/h via INTRAVENOUS
  Filled 2022-01-03 (×7): qty 100

## 2022-01-03 MED ORDER — LORAZEPAM 1 MG PO TABS
1.0000 mg | ORAL_TABLET | ORAL | Status: AC | PRN
  Administered 2022-01-05 (×2): 4 mg via ORAL
  Administered 2022-01-05: 2 mg via ORAL
  Administered 2022-01-06: 4 mg via ORAL
  Administered 2022-01-06: 3 mg via ORAL
  Filled 2022-01-03: qty 2
  Filled 2022-01-03 (×2): qty 4
  Filled 2022-01-03: qty 3
  Filled 2022-01-03: qty 4

## 2022-01-03 MED ORDER — LORAZEPAM 2 MG/ML IJ SOLN
1.0000 mg | INTRAMUSCULAR | Status: AC | PRN
  Administered 2022-01-03 (×2): 4 mg via INTRAVENOUS
  Administered 2022-01-03: 3 mg via INTRAVENOUS
  Administered 2022-01-03 (×2): 2 mg via INTRAVENOUS
  Administered 2022-01-04 (×2): 4 mg via INTRAVENOUS
  Administered 2022-01-04: 3 mg via INTRAVENOUS
  Administered 2022-01-04: 2 mg via INTRAVENOUS
  Administered 2022-01-04: 4 mg via INTRAVENOUS
  Administered 2022-01-05 – 2022-01-06 (×6): 2 mg via INTRAVENOUS
  Filled 2022-01-03 (×4): qty 1
  Filled 2022-01-03: qty 2
  Filled 2022-01-03 (×3): qty 1
  Filled 2022-01-03 (×2): qty 2
  Filled 2022-01-03: qty 1
  Filled 2022-01-03 (×5): qty 2

## 2022-01-03 NOTE — Consult Note (Signed)
[facility] Face-to-Face Psychiatry Consult   Reason for Consult: Acute psychosis/hallucinations in the setting of alcohol withdrawal Referring Physician:  Antonieta Pert, MD Patient Identification: SAL SWEETEN MRN:  [id number] Principal Diagnosis: Alcohol abuse with withdrawal [facility]) Diagnosis:  Principal Problem:   Alcohol abuse with withdrawal ([place]) Active Problems:   HIV disease ([place])   Cigarette smoker   Alcohol intoxication with delirium ([place])   Thrombocytopenia ([place])   Transaminitis   Suicidal ideation   Neutropenia ([employer])   Macrocytosis   Alcohol withdrawal ([place])  Total Time Spent in Direct Patient Care:  I personally spent 50 minutes on the unit in direct patient care. The direct patient care time included face-to-face time with the patient, reviewing the patient's chart, communicating with other professionals, and coordinating care. Greater than 50% of this time was spent in counseling or coordinating care with the patient regarding goals of hospitalization, psycho-education, and discharge planning needs.     Subjective: "I do not want to go to rehab, because I got stuck to do.  I would do better if they would just let me leave here and get a drink."  HPI:   JERMERE MESCHKE is a 36 y.o. male patient admitted with medical history significant of alcohol abuse, tobacco abuse, polysubstance abuse, HIV disease not on treatment, history of subdural hematoma, history of seizures, treatment noncompliance p/w suicidal ideation and alcohol withdrawal with visual/auditory hallucinations,was seeing clowns. Reports he drinks at least 2/5 of liquor daily. C/o abdominal pain associated with nausea, but no emesis, diarrhea, constipation, melena or hematochezia.  From initial psychiatric evaluation 01/02/2022: Patient is being followed by psychiatry for alcohol abuse withdrawal and history of delirium tremens.  Adeline was admitted under routine precautions his detox is overall largely successful, we are on  day (3) of detox. He is being treated with ativan and ciwa protocol.  CIWA score is average 14-22, patient has been normotensive throughout the day.  He continues to present with flushed skin, ongoing urges, cravings, and withdrawal symptoms.  He endorses sweating of the palms, flushed skin, and tremors.  He also endorses craving stating  "as soon as I hit the fucking door ongoing get me a drink.  Soon as you let me out this bitch. "Patient continues to lack insight at this time as he does not understand the negative impacts, effects alcohol withdrawal and delirium tremors have on him.  Delirium tremens historically setting and within 3 to 4 days from the last drink, so will expect symptoms to become worse at any rate within the next 12 to 24 hours.  He did voice being held against his will and previous experience in which law enforcement was called as he would not leave emergency room premises. "  They will put me in a wheelchair and pushed me outside, when I was sick throwing up everywhere and begging for help for detox.  And now you will not let me go so I can get a drink."  Nurse practitioner offered sympathy for his previous experience, however patient continues to lack insight that he remains unsteady and currently not yet medically stable to discharge from the hospital.  He always denies suicidal thoughts when I interviewed him denied homicidal thoughts and could contract fully at the point of discharge.   Past Psychiatric History: Alcohol abuse, tobacco abuse, polysubstance abuse  On reevaluation today 01/03/2022, patient states that he is feeling a little better, but notes that the detox protocol does not help stop his shakes or the  cravings.  He notes that yesterday when he was getting 4 mg of Ativan he felt better, but now he is just getting 1-2 mg of Ativan, "which does not help much." He states that he could do better left on his own and just drink alcohol.   Patient states that he is homeless and  "sleeps with the trees".  He asks how to get assistance with housing.  He notes that friends that are living in tiny houses after being set up by [facility] through the [facility].  He states that he does not want to have to go to the [facility] because "they are riffraff".  He does not desire rehabilitation at this time, noting that he has to get discharged in order to get his Social security and ID card.  He reports that he does work here and there, but asks if he would qualify for disability because of his HIV status.  He states he previously followed with Dr. Drucilla Schmidt for infectious diseases, but notes he has been off of his medications for 1 month.  Patient denies any history or current symptoms of depression.  He denies a history of anxiety other than when he cannot drink.  He reports that alcohol helps manage any "mood".  Patient specifically denies any suicidal ideation, plan, or intent.  He denies ever drinking alcohol as an attempt to end his life.  He denies any homicidal ideation.  He denies any auditory or visual hallucinations today.  Risk to Self: Denies Risk to Others: Denies Prior Inpatient Therapy: 1 inpatient psychiatric admission 07/26/2021 discharged on doxepin milligrams at bedtime for insomnia and nausea; and gabapentin 300 mg 3 times daily for alcohol withdrawal syndrome  Patient has multiple inpatient admissions for alcohol detoxification, he is going to multiple alcohol rehabilitation programs  Prior Outpatient Therapy: None consistent  Past Medical History:  Past Medical History:  Diagnosis Date   Alcohol abuse    HIV (human immunodeficiency virus infection) ([place])    Seizures ([place])    Subdural hematoma     Past Surgical History:  Procedure Laterality Date   INCISION AND DRAINAGE PERIRECTAL ABSCESS N/A 09/24/2016   Procedure: IRRIGATION AND DEBRIDEMENT PERIRECTAL ABSCESS;  Surgeon: Clayburn Pert, MD;  Location: [facility] ORS;  Service: General;  Laterality: N/A;   none     Family History:   Family History  Problem Relation Age of Onset   Alcohol abuse Mother    Alcohol abuse Father    Family Psychiatric  History:   Brother- Bipolar Disorder and EtOH abuse    Social History:  Social History   Substance and Sexual Activity  Alcohol Use Yes   Comment: "I drink all I can"     Social History   Substance and Sexual Activity  Drug Use Yes   Types: Methamphetamines, Marijuana   Comment: last used 04/10/2019    Social History   Socioeconomic History   Marital status: Unknown    Spouse name: Not on file   Number of children: Not on file   Years of education: Not on file   Highest education level: Not on file  Occupational History   Occupation: unemployed  Tobacco Use   Smoking status: Every Day    Packs/day: 0.50    Types: Cigarettes   Smokeless tobacco: Never   Tobacco comments:    unable to smoke while incarcerated 6+ months 02/13/20  Vaping Use   Vaping Use: Never used  Substance and Sexual Activity   Alcohol use: Yes  Comment: "I drink all I can"   Drug use: Yes    Types: Methamphetamines, Marijuana    Comment: last used 04/10/2019   Sexual activity: Yes    Partners: Male, Male    Comment: declined condoms  03/2021  Other Topics Concern   Not on file  Social History Narrative   "Currently living on the streets"   Independent at baseline   Social Determinants of Health   Financial Resource Strain: Not on file  Food Insecurity: Not on file  Transportation Needs: Not on file  Physical Activity: Not on file  Stress: Not on file  Social Connections: Not on file   Additional Social History:    Allergies:   Allergies  Allergen Reactions   Tegretol [Carbamazepine] Other (See Comments)    Caused vertigo for 2 days after taking it   Caffeine Palpitations    Labs:  Results for orders placed or performed during the hospital encounter of 12/31/21 (from the past 48 hour(s))  Lipase, blood     Status: Abnormal   Collection Time: 01/02/22  2:30  AM  Result Value Ref Range   Lipase 57 (H) 11 - 51 U/L    Comment: Performed at [facility], [place] [address], [place], [place] [address]  Comprehensive metabolic panel     Status: Abnormal   Collection Time: 01/02/22  2:30 AM  Result Value Ref Range   Sodium 135 135 - 145 mmol/L   Potassium 3.9 3.5 - 5.1 mmol/L   Chloride 105 98 - 111 mmol/L   CO2 22 22 - 32 mmol/L   Glucose, Bld 115 (H) 70 - 99 mg/dL    Comment: Glucose reference range applies only to samples taken after fasting for at least 8 hours.   BUN 6 6 - 20 mg/dL   Creatinine, Ser 0.55 (L) 0.61 - 1.24 mg/dL   Calcium 9.3 8.9 - 10.3 mg/dL   Total Protein 7.2 6.5 - 8.1 g/dL   Albumin 3.8 3.5 - 5.0 g/dL   AST 70 (H) 15 - 41 U/L   ALT 69 (H) 0 - 44 U/L   Alkaline Phosphatase 51 38 - 126 U/L   Total Bilirubin 1.0 0.3 - 1.2 mg/dL   GFR, Estimated >60 >60 mL/min    Comment: (NOTE) Calculated using the CKD-EPI Creatinine Equation (2021)    Anion gap 8 5 - 15    Comment: Performed at [facility], [place] [address] [place], [place] [address]  CBC     Status: Abnormal   Collection Time: 01/02/22  2:30 AM  Result Value Ref Range   WBC 2.5 (L) 4.0 - 10.5 K/uL   RBC 3.98 (L) 4.22 - 5.81 MIL/uL   Hemoglobin 13.8 13.0 - 17.0 g/dL   HCT 41.2 39.0 - 52.0 %   MCV 103.5 (H) 80.0 - 100.0 fL   MCH 34.7 (H) 26.0 - 34.0 pg   MCHC 33.5 30.0 - 36.0 g/dL   RDW 13.7 11.5 - 15.5 %   Platelets 72 (L) 150 - 400 K/uL    Comment: Immature Platelet Fraction may be clinically indicated, consider ordering this additional test [id number]    nRBC 0.0 0.0 - 0.2 %    Comment: Performed at [facility], [place] [address], [place], [place] [address]  Comprehensive metabolic panel     Status: Abnormal   Collection Time: 01/03/22  2:42 AM  Result Value Ref Range   Sodium 134 (L) 135 - 145 mmol/L  Potassium 4.0 3.5 - 5.1 mmol/L   Chloride 105 98 - 111 mmol/L   CO2 24 22 - 32 mmol/L   Glucose, Bld  113 (H) 70 - 99 mg/dL    Comment: Glucose reference range applies only to samples taken after fasting for at least 8 hours.   BUN 12 6 - 20 mg/dL   Creatinine, Ser 0.63 0.61 - 1.24 mg/dL   Calcium 8.8 (L) 8.9 - 10.3 mg/dL   Total Protein 6.7 6.5 - 8.1 g/dL   Albumin 3.5 3.5 - 5.0 g/dL   AST 73 (H) 15 - 41 U/L   ALT 80 (H) 0 - 44 U/L   Alkaline Phosphatase 52 38 - 126 U/L   Total Bilirubin 0.8 0.3 - 1.2 mg/dL   GFR, Estimated >60 >60 mL/min    Comment: (NOTE) Calculated using the CKD-EPI Creatinine Equation (2021)    Anion gap 5 5 - 15    Comment: Performed at [facility], [place] [address], [place], [place] [address]  CBC     Status: Abnormal   Collection Time: 01/03/22  2:42 AM  Result Value Ref Range   WBC 4.2 4.0 - 10.5 K/uL   RBC 3.69 (L) 4.22 - 5.81 MIL/uL   Hemoglobin 12.8 (L) 13.0 - 17.0 g/dL   HCT 38.7 (L) 39.0 - 52.0 %   MCV 104.9 (H) 80.0 - 100.0 fL   MCH 34.7 (H) 26.0 - 34.0 pg   MCHC 33.1 30.0 - 36.0 g/dL   RDW 13.7 11.5 - 15.5 %   Platelets 82 (L) 150 - 400 K/uL    Comment: Immature Platelet Fraction may be clinically indicated, consider ordering this additional test [id number] CONSISTENT WITH PREVIOUS RESULT    nRBC 0.0 0.0 - 0.2 %    Comment: Performed at [facility], [place] [address], [place], [place] [address]    Current Facility-Administered Medications  Medication Dose Route Frequency Provider Last Rate Last Admin   haloperidol (HALDOL) tablet 5 mg  5 mg Oral Q6H PRN Suella Broad, FNP   5 mg at 12/31/21 2144   And   benztropine (COGENTIN) tablet 1 mg  1 mg Oral Q6H PRN Suella Broad, FNP   1 mg at 01/02/22 1345   benztropine (COGENTIN) tablet 1 mg  1 mg Oral BID PRN Suella Broad, FNP       Or   benztropine mesylate (COGENTIN) injection 1 mg  1 mg Intramuscular BID PRN Suella Broad, FNP       Chlorhexidine Gluconate Cloth 2 % PADS 6 each  6 each Topical Q0600 Reubin Milan,  MD   6 each at 01/02/22 0555   dexmedetomidine (PRECEDEX) 200 MCG/50ML (4 mcg/mL) infusion  0.4-1.2 mcg/kg/hr Intravenous Titrated Gerald Leitz D, NP 8.56 mL/hr at 01/03/22 1221 0.5 mcg/kg/hr at 01/03/22 1221   dextrose 5 % and 0.9 % NaCl with KCl 20 mEq/L infusion   Intravenous Continuous Antonieta Pert, MD 50 mL/hr at 01/03/22 1221 Infusion Verify at [id number]   folic acid (FOLVITE) tablet 1 mg  1 mg Oral Daily Reubin Milan, MD   1 mg at 01/03/22 [id number]   haloperidol (HALDOL) tablet 5 mg  5 mg Oral Q8H PRN Suella Broad, FNP       Or   haloperidol lactate (HALDOL) injection 5 mg  5 mg Intravenous Q6H PRN Starkes-Perry, Gayland Curry, FNP       LORazepam (ATIVAN) tablet 1-4 mg  1-4  mg Oral Q1H PRN Rigoberto Noel, MD       Or   LORazepam (ATIVAN) injection 1-4 mg  1-4 mg Intravenous Q1H PRN Rigoberto Noel, MD   2 mg at 01/03/22 1113   metoprolol tartrate (LOPRESSOR) injection 5 mg  5 mg Intravenous Q6H PRN Reubin Milan, MD       multivitamin with minerals tablet 1 tablet  1 tablet Oral Daily Reubin Milan, MD   1 tablet at 01/03/22 0956   ondansetron [facility]) tablet 4 mg  4 mg Oral Q6H PRN Reubin Milan, MD       Or   ondansetron [facility]) injection 4 mg  4 mg Intravenous Q6H PRN Reubin Milan, MD   4 mg at 01/02/22 1844   thiamine tablet 100 mg  100 mg Oral Daily Reubin Milan, MD   100 mg at 01/03/22 [id number]   Or   thiamine (B-1) injection 100 mg  100 mg Intravenous Daily Reubin Milan, MD   100 mg at 01/02/22 1043   vitamin B-12 (CYANOCOBALAMIN) tablet 1,000 mcg  1,000 mcg Oral Daily Reubin Milan, MD   1,000 mcg at 01/03/22 [id number]    Musculoskeletal: Strength & Muscle Tone: decreased Gait & Station:  Not assessed, patient on Precedex drip Patient leans: N/A            Psychiatric Specialty Exam:  Presentation  General Appearance: Appropriate for Environment  Eye Contact:Fleeting  Speech:Garbled  Speech Volume:Decreased  (Very difficult to hear)  Handedness:Right   Mood and Affect  Mood:Irritable; Dysphoric  Affect:Congruent   Thought Process  Thought Processes:Goal Directed  Descriptions of Associations:Intact  Orientation:Full (Time, Place and Person)  Thought Content:Logical; Perseveration (Focused on finding someone to help him find housing.  Would like to live in a tiny house)  History of Schizophrenia/Schizoaffective disorder:No  Duration of Psychotic Symptoms:Less than six months  Hallucinations:Hallucinations: None  Ideas of Reference:None  Suicidal Thoughts:Suicidal Thoughts: No  Homicidal Thoughts:Homicidal Thoughts: No   Sensorium  Memory:Immediate Fair; Recent Good; Remote Good  Judgment:Poor  Insight:Poor   Executive Functions  Concentration:Fair  Attention Span:Fair  [place] of Knowledge:Good  Language:Good   Psychomotor Activity  Psychomotor Activity:Psychomotor Activity: Restlessness  Assets  Assets:Resilience   Sleep  Sleep:Sleep: Fair  Physical Exam: Physical Exam Vitals and nursing note reviewed.  Constitutional:      Comments: Thin  Cardiovascular:     Rate and Rhythm: Bradycardia present.  Pulmonary:     Effort: Pulmonary effort is normal. No respiratory distress.  Neurological:     General: No focal deficit present.     Mental Status: He is alert and oriented to person, place, and time.   Review of Systems  Constitutional: Negative.   Psychiatric/Behavioral:  Positive for substance abuse. Negative for depression, hallucinations, memory loss and suicidal ideas. The patient is nervous/anxious. The patient does not have insomnia.   Blood pressure 94/61, pulse (!) 59, temperature 98.1 F (36.7 C), temperature source Oral, resp. rate 17, height 6' (1.829 m), weight 68.5 kg, SpO2 96 %. Body mass index is 20.48 kg/m.  Treatment Plan Summary: -Appreciate social work support and providing resources to the patient for housing  options. -Will continue alcohol detox protocol at this time.  Patient continues to endorse pain, cravings, tremors, and withdrawal symptoms that are debilitating.  He also continues to have elevated CIWA scores, and will continue to need inpatient medical services.   -Continue Haldol and Cogentin for alcohol  intoxication, agitation, tremors.  -Patient continues to lack insight and judgment into his alcohol use severe disorder and possibility of delirium tremors that is a medical emergency.  Could consider IVC should patient attempt to leave AGAINST MEDICAL ADVICE  Disposition: Patient does not meet criteria for psychiatric inpatient admission. -At this time, patient does not desire substance use treatment.  He appears to have capacity for decision making at this time. - Patient is denying any suicidal or homicidal ideation, plan.  Should this change, please reconsult psychiatry for further evaluation - Once patient is medically stable, please reconsult psychiatry if patient is requesting treatment for mental health services for substance use rehabilitation.   Lavella Hammock, MD 01/03/2022 1:03 PM

## 2022-01-03 NOTE — Progress Notes (Signed)
PROGRESS NOTE Gary GRANTHAM  HYQ:[id number] DOB: 01/20/1986 DOA: 12/31/2021 PCP: Pcp, No   Brief Narrative/Hospital Course: 36 y.o. male with medical history significant of alcohol abuse, tobacco abuse, polysubstance abuse, HIV disease not on treatment, history of subdural hematoma, history of seizures, treatment noncompliance p/w suicidal ideation and alcohol withdrawal with visual/auditory hallucinations,was seeing clowns. Reports he drinks at least 2/5 of liquor daily. C/o abdominal pain associated with nausea, but no emesis, diarrhea, constipation, melena or hematochezia. In ed: Initial vital signs stable afebrile, started on lorazepam per CIWA protocol, labs- CBC leukopenia, hemoconcentration, thrombocytopenia- wbc 3.6, hb 14.4 g/dL and platelets 97.  Troponin x2 was normal.  ethyl alcohol was 482 mg/dL.  lipase  84, ast/alt 182/102, UDS positive for benzo.MRI brain findings that may represent age advanced chronic white matter microangiopathy, migraines, prior infection/inflammation, among other etiologies  Patient admitted for further management psychiatry was consulted. 2/23- Patient was placed under IVC given that he was having alcohol withdrawal and wanted to [facility] medical advice, remains with one-to-one sitter. 2/24 CIWA was very high up to 33 and placed on Precedex drip     Subjective: Seen and examined After starting Precedex drip had a very good night resting well.  Nursing reports he was calm and appropriate during night.  Under IVC since 2/23 Assessment and Plan: * Alcohol abuse with withdrawal (HCC)- (present on admission) Alcohol intoxication-last EtOH 2/20 2 AM Alcohol withdrawal delirium/hallucination: 2/24 CIWA was very high up to 33 and placed on Precedex drip.  Responding well, slept well.  Appreciate pulmonary critical input. Continue with current detox plan,  Haldol, MVI, thiamine folate.   Alcohol intoxication with delirium (HCC) See above  Neutropenia (HCC)-  (present on admission) On leukopenia Thrombocytopenia: Suspect due to patient's chronic alcoholism.Monitor CBC closely. Recent Labs  Lab 01/01/22 0235 01/02/22 0230 01/03/22 0242  HGB 13.1 13.8 12.8*  HCT 38.3* 41.2 38.7*  WBC 2.7* 2.5* 4.2  PLT 70* 72* 82*    Thrombocytopenia (HCC)- (present on admission) .  Transaminitis- (present on admission) AST/ALT improving,trend.Suspect 2/2 Etoh use.  Alcohol withdrawal (HCC)- (present on admission) .  Macrocytosis- (present on admission) From alcohol abuse.monitor.  Suicidal ideation Continue suicide precaution. Now on IVC since 01/01/22.  Psychiatry following.  Cigarette smoker- (present on admission) Cessation counseling  HIV disease (HCC)- (present on admission) Currently not on medical therapy.Last CD4 215 CD8 44 Seen by PCP/Whitney 10/2021- per note " She spoke to ID and was advised to reestablish HART therapy by following with ID as OP- and ID did not recommend continuing Hart therapy if patient has not been compliant"   DVT prophylaxis: SCDs Start: 12/31/21 0732.  No chemical prophylaxis due to thrombocytopenia Code Status:   Code Status: Full Code Family Communication: plan of care discussed with patient.  Disposition: Currently not medically stable for discharge. Status is: Inpatient in the stepdown The patient will remain hospitalized for ongoing management of alcohol withdrawal/suicidal ideation, under IVC since 2/23 will need a renewal in 7 days if still SI.   Objective: Vitals last 24 hrs: Vitals:   01/03/22 0400 01/03/22 0407 01/03/22 0500 01/03/22 0600  BP: 102/77  100/71 104/76  Pulse: 63  (!) 59 63  Resp: 14  15 18   Temp:  98 F (36.7 C)    TempSrc:  Oral    SpO2: 96%  96% 97%  Weight:      Height:       Weight change:   Physical Examination: General exam: Eyes closed/sleepy, reddish  skin  HEENT:Oral mucosa moist, Ear/Nose WNL grossly, dentition normal. Respiratory system: bilaterally clear, no  use of accessory muscle Cardiovascular system: S1 & S2 +, No JVD,. Gastrointestinal system: Abdomen soft,NT,ND,BS+ Nervous System:Alert, awake, moving extremities and grossly nonfocal Extremities: LE ankle edema  none, distal peripheral pulses palpable.  Skin: No rashes,no icterus. MSK: Normal muscle bulk,tone, power   Medications reviewed:  Scheduled Meds:  Chlorhexidine Gluconate Cloth  6 each Topical Q0600   folic acid  1 mg Oral Daily   multivitamin with minerals  1 tablet Oral Daily   thiamine  100 mg Oral Daily   Or   thiamine  100 mg Intravenous Daily   vitamin B-12  1,000 mcg Oral Daily   Continuous Infusions:  dexmedetomidine (PRECEDEX) IV infusion 0.7 mcg/kg/hr (01/03/22 0421)   dextrose 5 % and 0.9 % NaCl with KCl 20 mEq/L 50 mL/hr at 01/03/22 0408      Diet Order             Diet regular Room service appropriate? Yes; Fluid consistency: Thin  Diet effective now                   Intake/Output Summary (Last 24 hours) at 01/03/2022 0727 Last data filed at 01/03/2022 0408 Gross per 24 hour  Intake 1687.19 ml  Output 150 ml  Net 1537.19 ml   Net IO Since Admission: 1,896.98 mL [01/03/22 0727]  Wt Readings from Last 3 Encounters:  12/31/21 68.5 kg  11/08/21 74.4 kg  09/30/21 70.3 kg     Unresulted Labs (From admission, onward)     Start     Ordered   01/02/22 0500  Comprehensive metabolic panel  Daily,   R     Question:  Specimen collection method  Answer:  Lab=Lab collect   01/01/22 0727   01/02/22 0500  CBC  Daily,   R     Question:  Specimen collection method  Answer:  Lab=Lab collect   01/01/22 0727           Data Reviewed: I have personally reviewed following labs and imaging studies CBC: Recent Labs  Lab 12/31/21 0033 12/31/21 1044 01/01/22 0235 01/02/22 0230 01/03/22 0242  WBC 3.6* 2.8* 2.7* 2.5* 4.2  NEUTROABS 1.5*  --   --   --   --   HGB 14.4 12.7* 13.1 13.8 12.8*  HCT 41.8 37.7* 38.3* 41.2 38.7*  MCV 101.5* 102.7* 103.0*  103.5* 104.9*  PLT 97* 81* 70* 72* 82*   Basic Metabolic Panel: Recent Labs  Lab 12/31/21 0033 12/31/21 1044 01/01/22 0235 01/02/22 0230 01/03/22 0242  NA 137 138 133* 135 134*  K 3.8 3.6 3.5 3.9 4.0  CL 102 104 102 105 105  CO2 23 25 25 22 24   GLUCOSE 84 95 96 115* 113*  BUN 12 9 6 6 12   CREATININE 0.57* 0.54* 0.44* 0.55* 0.63  CALCIUM 8.5* 8.1* 9.0 9.3 8.8*  MG  --  1.8  --   --   --   PHOS  --  4.5  --   --   --    GFR: Estimated Creatinine Clearance: 124.9 mL/min (by C-G formula based on SCr of 0.63 mg/dL). Liver Function Tests: Recent Labs  Lab 12/31/21 0033 12/31/21 1044 01/01/22 0235 01/02/22 0230 01/03/22 0242  AST 182* 129* 88* 70* 73*  ALT 102* 85* 74* 69* 80*  ALKPHOS 54 44 46 51 52  BILITOT 0.7 0.6 1.0 1.0 0.8  PROT 7.6 6.7  6.6 7.2 6.7  ALBUMIN 4.2 3.5 3.6 3.8 3.5   Recent Labs  Lab 12/31/21 0033 01/01/22 0235 01/02/22 0230  LIPASE 116* 84* 57*   No results for input(s): AMMONIA in the last 168 hours. Coagulation Profile: Recent Labs  Lab 12/31/21 1044  INR 1.0   Cardiac Enzymes: Recent Labs  Lab 12/31/21 1045  CKTOTAL 221   BNP (last 3 results) No results for input(s): PROBNP in the last 8760 hours. HbA1C: No results for input(s): HGBA1C in the last 72 hours. CBG: No results for input(s): GLUCAP in the last 168 hours. Lipid Profile: No results for input(s): CHOL, HDL, LDLCALC, TRIG, CHOLHDL, LDLDIRECT in the last 72 hours. Thyroid Function Tests: Recent Labs    12/31/21 1044  TSH 3.238   Anemia Panel: Recent Labs    12/31/21 1044  VITAMINB12 306  FOLATE 6.5   Sepsis Labs: No results for input(s): PROCALCITON, LATICACIDVEN in the last 168 hours.  Recent Results (from the past 240 hour(s))  Resp Panel by RT-PCR (Flu A&B, Covid) Nasopharyngeal Swab     Status: None   Collection Time: 12/31/21  8:06 AM   Specimen: Nasopharyngeal Swab; Nasopharyngeal(NP) swabs in vial transport medium  Result Value Ref Range Status   SARS  Coronavirus 2 by RT PCR NEGATIVE NEGATIVE Final    Comment: (NOTE) SARS-CoV-2 target nucleic acids are NOT DETECTED.  The SARS-CoV-2 RNA is generally detectable in upper respiratory specimens during the acute phase of infection. The lowest concentration of SARS-CoV-2 viral copies this assay can detect is 138 copies/mL. A negative result does not preclude SARS-Cov-2 infection and should not be used as the sole basis for treatment or other patient management decisions. A negative result may occur with  improper specimen collection/handling, submission of specimen other than nasopharyngeal swab, presence of viral mutation(s) within the areas targeted by this assay, and inadequate number of viral copies(<138 copies/mL). A negative result must be combined with clinical observations, patient history, and epidemiological information. The expected result is Negative.  Fact Sheet for Patients:  BloggerCourse.com  Fact Sheet for Healthcare Providers:  SeriousBroker.it  This test is no t yet approved or cleared by the [place] FDA and  has been authorized for detection and/or diagnosis of SARS-CoV-2 by FDA under an Emergency Use Authorization (EUA). This EUA will remain  in effect (meaning this test can be used) for the duration of the COVID-19 declaration under Section 564(b)(1) of the Act, 21 U.S.C.section 360bbb-3(b)(1), unless the authorization is terminated  or revoked sooner.       Influenza A by PCR NEGATIVE NEGATIVE Final   Influenza B by PCR NEGATIVE NEGATIVE Final    Comment: (NOTE) The Xpert Xpress SARS-CoV-2/FLU/RSV plus assay is intended as an aid in the diagnosis of influenza from Nasopharyngeal swab specimens and should not be used as a sole basis for treatment. Nasal washings and aspirates are unacceptable for Xpert Xpress SARS-CoV-2/FLU/RSV testing.  Fact Sheet for  Patients: BloggerCourse.com  Fact Sheet for Healthcare Providers: SeriousBroker.it  This test is not yet approved or cleared by the [place] FDA and has been authorized for detection and/or diagnosis of SARS-CoV-2 by FDA under an Emergency Use Authorization (EUA). This EUA will remain in effect (meaning this test can be used) for the duration of the COVID-19 declaration under Section 564(b)(1) of the Act, 21 U.S.C. section 360bbb-3(b)(1), unless the authorization is terminated or revoked.  Performed at [facility] [place] [facility], [address], [place], [place] [address]   MRSA  Next Gen by PCR, Nasal     Status: None   Collection Time: 12/31/21  9:46 AM   Specimen: Nasal Mucosa; Nasal Swab  Result Value Ref Range Status   MRSA by PCR Next Gen NOT DETECTED NOT DETECTED Final    Comment: (NOTE) The GeneXpert MRSA Assay (FDA approved for NASAL specimens only), is one component of a comprehensive MRSA colonization surveillance program. It is not intended to diagnose MRSA infection nor to guide or monitor treatment for MRSA infections. Test performance is not FDA approved in patients less than 51 years old. Performed at [facility], [address], [place], [place] [address]     Antimicrobials: Anti-infectives (From admission, onward)    None      Culture/Microbiology Radiology Studies: No results found.   LOS: 2 days   Lanae Boast, MD [facility]  01/03/2022, 7:27 AM

## 2022-01-03 NOTE — Progress Notes (Signed)
Pt asked "how long he will be in he hospital". Due to withdrawal, pt was told he will be a couple of days here. Pt stated he "wanted to leave". I told pt they are unable to leave due to their circumstances. Pt stated "when he leaves, he will be drinking". Discussed with pt that was not good idea, & can lead him to be readmitted. Pt said "he will not come back since he would be drinking". Pt stated to show some aggression & irritation towards me as the conversation continued. Pt was given Ativan for withdrawal symptoms. Pt asked "how much he was getting", which was 2mg  per CIWA scale. Pt stated he gets more than this, he gets 4. Pt was educated on the CIWA scale, but seem frustrated saying something under his breath. Charge Nurse was told of situation & spoke to pt.

## 2022-01-03 NOTE — Progress Notes (Signed)
Asked pt about getting a bath. Pt refused. Asked pt if he was willing to do a CHG wipe down. Pt refused and wants to shower. Pt told he is unstable for a shower. Pt still refused. Refusal in [facility]

## 2022-01-03 NOTE — Progress Notes (Signed)
NAME:  Gary [name], MRN:  [id number], DOB:  Jul 14, 1986, LOS: 2 ADMISSION DATE:  12/31/2021, CONSULTATION DATE:  01/02/2022 REFERRING MD:  Dr. Jonathon Bellows, CHIEF COMPLAINT: Alcohol withdrawal  History of Present Illness:  Gary [name] is a 36 year old male with a past medical history significant for bipolar disease, HIV, alcohol abuse, subdural hematoma and seizures who presented to the ED for complaints of suicidal ideation alcohol withdrawal with visual/auditory hallucinations.  He was admitted per [facility] for management of alcohol withdrawal and psychiatric consultation.  Afternoon of 2/24 patient began experiencing worsening alcohol withdrawal symptoms with CIWA elevated at 33.  This was accompanied with significant agitation and combativeness for which security was called.  Patient is currently IVC.  PCCM consulted for further management and need for Precedex drip  Pertinent  Medical History  Bipolar disease HIV Alcohol abuse Subdural hematoma Seizure  Significant Hospital Events: Including procedures, antibiotic start and stop dates in addition to other pertinent events   2/22 admitted for suicidal ideations and alcohol withdrawal 2/24 PCCM consulted for management of Precedex drip  Interim History / Subjective:  Appears calm on Precedex Did not require Ativan last 12 hours   Objective   Blood pressure 108/79, pulse (!) 56, temperature 98.5 F (36.9 C), temperature source Oral, resp. rate 17, height 6' (1.829 m), weight 68.5 kg, SpO2 95 %.        Intake/Output Summary (Last 24 hours) at 01/03/2022 1022 Last data filed at 01/03/2022 6659 Gross per 24 hour  Intake 1980.04 ml  Output 150 ml  Net 1830.04 ml    Filed Weights   12/31/21 0025 12/31/21 0938  Weight: 68 kg 68.5 kg    Examination: General: Acute ill-appearing adult male lying in bed in no distress HEENT: [place]/AT, MM pink/moist, PERRL,  Neuro: Somnolent, confused when wakes up, grossly nonfocal CV: s1s2 regular rate  and rhythm, no murmur, rubs, or gallops,  PULM: No accessory muscle use, clear breath sounds bilateral GI: soft, bowel sounds active in all 4 quadrants, non-tender, non-distended Extremities: warm/dry, no edema  Skin: no rashes or lesions  Labs show mild hyponatremia otherwise normal electrolytes, WBC count improved, still exacerbated  Resolved Hospital Problem list     Assessment & Plan:  Acute alcohol withdrawal with associated delirium/hallucinations  P: Continue Precedex drip Supplement thiamine, folate, multivitamin Seizure precautions Alcohol cessation education when appropriate Monitor CIWA scale and use Ativan as needed  Leukopenia , improved Thrombocytopenia Macrocytosis P: Trend CBC   Transaminitis -Felt secondary to chronic alcohol use P: Avoid hepatotoxins Intermittently trend LFTs  Suicidal ideations P: Psychiatry consulted and  IVC as of 2/23 Suicide sitter  History of HIV -Currently not on medical therapy, last CD4 count 214 P: Outpatient follow-up with ID, noncompliance noted   Best Practice (right click and "Reselect all SmartList Selections" daily)   Diet/type: NPO DVT prophylaxis: SCD GI prophylaxis: N/A Lines: N/A Foley:  N/A Code Status:  full code Last date of multidisciplinary goals of care discussion: Pending   Labs   CBC: Recent Labs  Lab 12/31/21 0033 12/31/21 1044 01/01/22 0235 01/02/22 0230 01/03/22 0242  WBC 3.6* 2.8* 2.7* 2.5* 4.2  NEUTROABS 1.5*  --   --   --   --   HGB 14.4 12.7* 13.1 13.8 12.8*  HCT 41.8 37.7* 38.3* 41.2 38.7*  MCV 101.5* 102.7* 103.0* 103.5* 104.9*  PLT 97* 81* 70* 72* 82*     Basic Metabolic Panel: Recent Labs  Lab 12/31/21 0033 12/31/21 1044 01/01/22  0235 01/02/22 0230 01/03/22 0242  NA 137 138 133* 135 134*  K 3.8 3.6 3.5 3.9 4.0  CL 102 104 102 105 105  CO2 23 25 25 22 24   GLUCOSE 84 95 96 115* 113*  BUN 12 9 6 6 12   CREATININE 0.57* 0.54* 0.44* 0.55* 0.63  CALCIUM 8.5* 8.1*  9.0 9.3 8.8*  MG  --  1.8  --   --   --   PHOS  --  4.5  --   --   --     GFR: Estimated Creatinine Clearance: 124.9 mL/min (by C-G formula based on SCr of 0.63 mg/dL). Recent Labs  Lab 12/31/21 1044 01/01/22 0235 01/02/22 0230 01/03/22 0242  WBC 2.8* 2.7* 2.5* 4.2     Liver Function Tests: Recent Labs  Lab 12/31/21 0033 12/31/21 1044 01/01/22 0235 01/02/22 0230 01/03/22 0242  AST 182* 129* 88* 70* 73*  ALT 102* 85* 74* 69* 80*  ALKPHOS 54 44 46 51 52  BILITOT 0.7 0.6 1.0 1.0 0.8  PROT 7.6 6.7 6.6 7.2 6.7  ALBUMIN 4.2 3.5 3.6 3.8 3.5    Recent Labs  Lab 12/31/21 0033 01/01/22 0235 01/02/22 0230  LIPASE 116* 84* 57*    No results for input(s): AMMONIA in the last 168 hours.  ABG    Component Value Date/Time   HCO3 27.1 02/12/2021 0056   TCO2 23 04/29/2021 2105   O2SAT 100.0 02/12/2021 0056      Coagulation Profile: Recent Labs  Lab 12/31/21 1044  INR 1.0     Cardiac Enzymes: Recent Labs  Lab 12/31/21 1045  CKTOTAL 221     HbA1C: Hgb A1c MFr Bld  Date/Time Value Ref Range Status  07/26/2021 06:53 AM 4.3 (L) 4.8 - 5.6 % Final    Comment:    (NOTE) Pre diabetes:          5.7%-6.4%  Diabetes:              >6.4%  Glycemic control for   <7.0% adults with diabetes   03/12/2021 03:57 AM 5.1 4.8 - 5.6 % Final    Comment:    (NOTE) Pre diabetes:          5.7%-6.4%  Diabetes:              >6.4%  Glycemic control for   <7.0% adults with diabetes     CBG: No results for input(s): GLUCAP in the last 168 hours.    07/28/2021 MD. 05/12/2021. [facility] Pager : [phone]  If no response to pager , please call [phone] until 7 pm After 7:00 pm call Elink  [phone]    01/03/2022, 10:22 AM

## 2022-01-04 DIAGNOSIS — B20 Human immunodeficiency virus [HIV] disease: Secondary | ICD-10-CM

## 2022-01-04 DIAGNOSIS — F17210 Nicotine dependence, cigarettes, uncomplicated: Secondary | ICD-10-CM

## 2022-01-04 LAB — CBC
HCT: 39.5 % (ref 39.0–52.0)
Hemoglobin: 13.5 g/dL (ref 13.0–17.0)
MCH: 35.3 pg — ABNORMAL HIGH (ref 26.0–34.0)
MCHC: 34.2 g/dL (ref 30.0–36.0)
MCV: 103.4 fL — ABNORMAL HIGH (ref 80.0–100.0)
Platelets: 107 10*3/uL — ABNORMAL LOW (ref 150–400)
RBC: 3.82 MIL/uL — ABNORMAL LOW (ref 4.22–5.81)
RDW: 13.3 % (ref 11.5–15.5)
WBC: 3.1 10*3/uL — ABNORMAL LOW (ref 4.0–10.5)
nRBC: 0 % (ref 0.0–0.2)

## 2022-01-04 LAB — COMPREHENSIVE METABOLIC PANEL
ALT: 90 U/L — ABNORMAL HIGH (ref 0–44)
AST: 75 U/L — ABNORMAL HIGH (ref 15–41)
Albumin: 3.5 g/dL (ref 3.5–5.0)
Alkaline Phosphatase: 53 U/L (ref 38–126)
Anion gap: 7 (ref 5–15)
BUN: 15 mg/dL (ref 6–20)
CO2: 21 mmol/L — ABNORMAL LOW (ref 22–32)
Calcium: 9.1 mg/dL (ref 8.9–10.3)
Chloride: 105 mmol/L (ref 98–111)
Creatinine, Ser: 0.61 mg/dL (ref 0.61–1.24)
GFR, Estimated: >60 mL/min (ref >60)
Glucose, Bld: 103 mg/dL — ABNORMAL HIGH (ref 70–99)
Potassium: 3.9 mmol/L (ref 3.5–5.1)
Sodium: 133 mmol/L — ABNORMAL LOW (ref 135–145)
Total Bilirubin: 0.6 mg/dL (ref 0.3–1.2)
Total Protein: 7 g/dL (ref 6.5–8.1)

## 2022-01-04 LAB — MAGNESIUM: Magnesium: 2 mg/dL (ref 1.7–2.4)

## 2022-01-04 MED ORDER — LORAZEPAM 1 MG PO TABS
1.0000 mg | ORAL_TABLET | Freq: Three times a day (TID) | ORAL | Status: DC
  Administered 2022-01-04 – 2022-01-05 (×4): 1 mg via ORAL
  Filled 2022-01-04 (×4): qty 1

## 2022-01-04 NOTE — Progress Notes (Signed)
PROGRESS NOTE Parthenia Amesony P Hollenbeck  UJW:[id number] DOB: 01/20/1986 DOA: 12/31/2021 PCP: Pcp, No   Brief Narrative/Hospital Course: 36 y.o. male with medical history significant of alcohol abuse, tobacco abuse, polysubstance abuse, HIV disease not on treatment, history of subdural hematoma, history of seizures, treatment noncompliance p/w suicidal ideation and alcohol withdrawal with visual/auditory hallucinations,was seeing clowns. Reports he drinks at least 2/5 of liquor daily. C/o abdominal pain associated with nausea, but no emesis, diarrhea, constipation, melena or hematochezia. In ed: Initial vital signs stable afebrile, started on lorazepam per CIWA protocol, labs- CBC leukopenia, hemoconcentration, thrombocytopenia- wbc 3.6, hb 14.4 g/dL and platelets 97.  Troponin x2 was normal.  ethyl alcohol was 482 mg/dL.  lipase  84, ast/alt 182/102, UDS positive for benzo.MRI brain findings that may represent age advanced chronic white matter microangiopathy, migraines, prior infection/inflammation, among other etiologies  Patient admitted for further management psychiatry was consulted. 2/23- Patient was placed under IVC given that [facility] was having alcohol withdrawal [facility] remains with one-to-one sitter. 2/24 CIWA was very high up to 33 and placed on Precedex drip     Subjective: Seen and examined this morning.  Sitter at the bedside.  Patient sleeping. He did wake up reports he is feeling better. No other new complaints Under IVC since 2/23, on Precedex drip since 2/24 and gentle IV fluid hydration  Assessment and Plan: * Alcohol abuse with withdrawal (HCC)- (present on admission) Alcohol intoxication-last EtOH 2/20 2 AM Alcohol withdrawal delirium/hallucination recurrent admission for alcohol withdrawal delirium and usually needs Precedex: 2/24 CIWA was very high up to 33 and placed on Precedex drip-PCCM managing, appreciate input.  Continue CIWA Ativan as needed, score  23-26 yesterday evening.  Continue with current detox plan, Haldol, MVI, thiamine folate. Psychiatry following do not recommend inpatient psychiatric treatment   Alcohol intoxication with delirium (HCC) See above  Neutropenia (HCC)- (present on admission) leukopenia Thrombocytopenia: Suspect due to patient's chronic alcoholism.overall stable and improving monitor Recent Labs  Lab 01/02/22 0230 01/03/22 0242 01/04/22 0303  HGB 13.8 12.8* 13.5  HCT 41.2 38.7* 39.5  WBC 2.5* 4.2 3.1*  PLT 72* 82* 107*    Thrombocytopenia (HCC)- (present on admission) See above  Transaminitis- (present on admission) Due to his alcohol abuse, AST/ALT 75/90-trend  Macrocytosis- (present on admission) From alcohol abuse.monitor.  Suicidal ideation Continue suicide precaution. Now on IVC since 01/01/22.  Psychiatry following.  Improving mentation  Cigarette smoker- (present on admission) Cessation counseling  HIV disease (HCC)- (present on admission) Currently not on medical therapy.Last CD4 215 CD8 44 Seen by PCP/Whitney 10/2021- per note " She spoke to ID and was advised to reestablish HART therapy by following with ID as OP- and ID did not recommend continuing Hart therapy if patient has not been compliant"   DVT prophylaxis: SCDs Start: 12/31/21 0732.  No chemical prophylaxis due to thrombocytopenia Code Status:   Code Status: Full Code Family Communication: plan of care discussed with patient.  Disposition: Currently not medically stable for discharge. Status is: Inpatient in the stepdown The patient will remain hospitalized for ongoing management of alcohol withdrawal/suicidal ideation, under IVC since 2/23 will need a renewal in 7 days if still SI- psych following.   Objective: Vitals last 24 hrs: Vitals:   01/03/22 2200 01/04/22 0007 01/04/22 0352 01/04/22 0400  BP: (!) 143/104   119/82  Pulse: (!) 58   (!) 58  Resp: (!) 22     Temp:  98.6 F (37 C) 97.8 F (  36.6 C)    TempSrc:  Oral Oral   SpO2: 96%   96%  Weight:      Height:       Weight change:   Physical Examination: General exam: Sleepy but able to wake up interact HEENT:Oral mucosa moist, Ear/Nose WNL grossly, dentition normal. Respiratory system: bilaterally clear,no use of accessory muscle Cardiovascular system: S1 & S2 +, No JVD,. Gastrointestinal system: Abdomen soft,NT,ND,BS+ Nervous System:Alert, awake, moving extremities and grossly nonfocal Extremities: LE ankle edema none, distal peripheral pulses palpable.  Skin: No rashes,no icterus. MSK: Normal muscle bulk,tone, power   Medications reviewed:  Scheduled Meds:  Chlorhexidine Gluconate Cloth  6 each Topical Q0600   folic acid  1 mg Oral Daily   multivitamin with minerals  1 tablet Oral Daily   thiamine  100 mg Oral Daily   Or   thiamine  100 mg Intravenous Daily   vitamin B-12  1,000 mcg Oral Daily   Continuous Infusions:  dexmedetomidine (PRECEDEX) IV infusion 1 mcg/kg/hr (01/04/22 0440)   dextrose 5 % and 0.9 % NaCl with KCl 20 mEq/L 50 mL/hr at 01/04/22 0440      Diet Order             Diet regular Room service appropriate? Yes; Fluid consistency: Thin  Diet effective now                   Intake/Output Summary (Last 24 hours) at 01/04/2022 0741 Last data filed at 01/04/2022 0440 Gross per 24 hour  Intake 2212.84 ml  Output 1000 ml  Net 1212.84 ml   Net IO Since Admission: 3,109.82 mL [01/04/22 0741]  Wt Readings from Last 3 Encounters:  12/31/21 68.5 kg  11/08/21 74.4 kg  09/30/21 70.3 kg     Unresulted Labs (From admission, onward)    None      Data Reviewed: I have personally reviewed following labs and imaging studies CBC: Recent Labs  Lab 12/31/21 0033 12/31/21 1044 01/01/22 0235 01/02/22 0230 01/03/22 0242 01/04/22 0303  WBC 3.6* 2.8* 2.7* 2.5* 4.2 3.1*  NEUTROABS 1.5*  --   --   --   --   --   HGB 14.4 12.7* 13.1 13.8 12.8* 13.5  HCT 41.8 37.7* 38.3* 41.2 38.7* 39.5  MCV  101.5* 102.7* 103.0* 103.5* 104.9* 103.4*  PLT 97* 81* 70* 72* 82* 107*   Basic Metabolic Panel: Recent Labs  Lab 12/31/21 1044 01/01/22 0235 01/02/22 0230 01/03/22 0242 01/04/22 0303  NA 138 133* 135 134* 133*  K 3.6 3.5 3.9 4.0 3.9  CL 104 102 105 105 105  CO2 25 25 22 24  21*  GLUCOSE 95 96 115* 113* 103*  BUN 9 6 6 12 15   CREATININE 0.54* 0.44* 0.55* 0.63 0.61  CALCIUM 8.1* 9.0 9.3 8.8* 9.1  MG 1.8  --   --   --  2.0  PHOS 4.5  --   --   --   --    GFR: Estimated Creatinine Clearance: 124.9 mL/min (by C-G formula based on SCr of 0.61 mg/dL). Liver Function Tests: Recent Labs  Lab 12/31/21 1044 01/01/22 0235 01/02/22 0230 01/03/22 0242 01/04/22 0303  AST 129* 88* 70* 73* 75*  ALT 85* 74* 69* 80* 90*  ALKPHOS 44 46 51 52 53  BILITOT 0.6 1.0 1.0 0.8 0.6  PROT 6.7 6.6 7.2 6.7 7.0  ALBUMIN 3.5 3.6 3.8 3.5 3.5   Recent Labs  Lab 12/31/21 0033 01/01/22 0235 01/02/22 0230  LIPASE 116* 84* 57*   No results for input(s): AMMONIA in the last 168 hours. Coagulation Profile: Recent Labs  Lab 12/31/21 1044  INR 1.0   Cardiac Enzymes: Recent Labs  Lab 12/31/21 1045  CKTOTAL 221   BNP (last 3 results) No results for input(s): PROBNP in the last 8760 hours. HbA1C: No results for input(s): HGBA1C in the last 72 hours. CBG: No results for input(s): GLUCAP in the last 168 hours. Lipid Profile: No results for input(s): CHOL, HDL, LDLCALC, TRIG, CHOLHDL, LDLDIRECT in the last 72 hours. Thyroid Function Tests: No results for input(s): TSH, T4TOTAL, FREET4, T3FREE, THYROIDAB in the last 72 hours.  Anemia Panel: No results for input(s): VITAMINB12, FOLATE, FERRITIN, TIBC, IRON, RETICCTPCT in the last 72 hours.  Sepsis Labs: No results for input(s): PROCALCITON, LATICACIDVEN in the last 168 hours.  Recent Results (from the past 240 hour(s))  Resp Panel by RT-PCR (Flu A&B, Covid) Nasopharyngeal Swab     Status: None   Collection Time: 12/31/21  8:06 AM    Specimen: Nasopharyngeal Swab; Nasopharyngeal(NP) swabs in vial transport medium  Result Value Ref Range Status   SARS Coronavirus 2 by RT PCR NEGATIVE NEGATIVE Final    Comment: (NOTE) SARS-CoV-2 target nucleic acids are NOT DETECTED.  The SARS-CoV-2 RNA is generally detectable in upper respiratory specimens during the acute phase of infection. The lowest concentration of SARS-CoV-2 viral copies this assay can detect is 138 copies/mL. A negative result does not preclude SARS-Cov-2 infection and should not be used as the sole basis for treatment or other patient management decisions. A negative result may occur with  improper specimen collection/handling, submission of specimen other than nasopharyngeal swab, presence of viral mutation(s) within the areas targeted by this assay, and inadequate number of viral copies(<138 copies/mL). A negative result must be combined with clinical observations, patient history, and epidemiological information. The expected result is Negative.  Fact Sheet for Patients:  BloggerCourse.com  Fact Sheet for Healthcare Providers:  SeriousBroker.it  This test is no t yet approved or cleared by the [place] FDA and  has been authorized for detection and/or diagnosis of SARS-CoV-2 by FDA under an Emergency Use Authorization (EUA). This EUA will remain  in effect (meaning this test can be used) for the duration of the COVID-19 declaration under Section 564(b)(1) of the Act, 21 U.S.C.section 360bbb-3(b)(1), unless the authorization is terminated  or revoked sooner.       Influenza A by PCR NEGATIVE NEGATIVE Final   Influenza B by PCR NEGATIVE NEGATIVE Final    Comment: (NOTE) The Xpert Xpress SARS-CoV-2/FLU/RSV plus assay is intended as an aid in the diagnosis of influenza from Nasopharyngeal swab specimens and should not be used as a sole basis for treatment. Nasal washings and aspirates are  unacceptable for Xpert Xpress SARS-CoV-2/FLU/RSV testing.  Fact Sheet for Patients: BloggerCourse.com  Fact Sheet for Healthcare Providers: SeriousBroker.it  This test is not yet approved or cleared by the [place] FDA and has been authorized for detection and/or diagnosis of SARS-CoV-2 by FDA under an Emergency Use Authorization (EUA). This EUA will remain in effect (meaning this test can be used) for the duration of the COVID-19 declaration under Section 564(b)(1) of the Act, 21 U.S.C. section 360bbb-3(b)(1), unless the authorization is terminated or revoked.  Performed at [facility], [address] [place], [place] [address]   MRSA Next Gen by PCR, Nasal     Status: None   Collection Time: 12/31/21  9:46 AM  Specimen: Nasal Mucosa; Nasal Swab  Result Value Ref Range Status   MRSA by PCR Next Gen NOT DETECTED NOT DETECTED Final    Comment: (NOTE) The GeneXpert MRSA Assay (FDA approved for NASAL specimens only), is one component of a comprehensive MRSA colonization surveillance program. It is not intended to diagnose MRSA infection nor to guide or monitor treatment for MRSA infections. Test performance is not FDA approved in patients less than 78 years old. Performed at [facility], [address], [place], [place] [address]     Antimicrobials: Anti-infectives (From admission, onward)    None      Culture/Microbiology Radiology Studies: No results found.   LOS: 3 days   Lanae Boast, MD [facility]  01/04/2022, 7:41 AM

## 2022-01-04 NOTE — Progress Notes (Incomplete)
Patient wanted to leave AMA. Discussed with patient that he was IVC at this time. Patient also wanted more Ativan. Discussed with patient that CIWA scale determines the dosage of Ativan given per MD order. Patient was given ativan 2mg  at 1343. Per order, ativan 1-4mg  per CIWA scale can be given q 1hr.

## 2022-01-04 NOTE — Consult Note (Signed)
Gary [name] is a 36 y.o. male with medical history significant of alcohol abuse, tobacco abuse, polysubstance abuse, HIV disease not on treatment, history of subdural hematoma, history of seizures, treatment noncompliance p/w suicidal ideation and alcohol withdrawal with visual/auditory hallucinations,was seeing clowns. Reports he drinks at least 2/5 of liquor daily. C/o abdominal pain associated with nausea, but no emesis, diarrhea, constipation, melena or hematochezia.   Patient is being followed by psychiatry for alcohol abuse withdrawal and history of delirium tremens.  This is day 5 of detox.  He has been on a Precedex drip.  Patient continues to deny SI/HI/AVH.  He does not desire substance abuse rehabilitation program.  He becomes agitated with questioning, stating he is being held here against his will, and he wishes to discharge.  -Will continue alcohol detox protocol at this time, and reassess after detox protocol is completed to evaluate if patient meets criteria for inpatient psychiatric admission or has reconsidered substance use rehabilitation resources. -Patient continues to remain a danger to self, as he lacks insight and judgment into his alcohol use severe disorder and possibility of delirium tremors that is a medical emergency.  Total Time Spent in Direct Patient Care:  I personally spent 20 minutes on the unit in direct patient care. The direct patient care time included face-to-face time with the patient, reviewing the patient's chart, communicating with other professionals, and coordinating care. Greater than 50% of this time was spent in counseling or coordinating care with the patient regarding goals of hospitalization, psycho-education, and discharge planning needs.  Mariel Craft, MD

## 2022-01-04 NOTE — Progress Notes (Signed)
NAME:  Gary [name], MRN:  [id number], DOB:  07-12-1986, LOS: 3 ADMISSION DATE:  12/31/2021, CONSULTATION DATE:  01/02/2022 REFERRING MD:  Dr. Lupita Leash, CHIEF COMPLAINT: Alcohol withdrawal  History of Present Illness:  Gary [name] is a 36 year old male with a past medical history significant for bipolar disease, HIV, alcohol abuse, subdural hematoma and seizures who presented to the ED for complaints of suicidal ideation alcohol withdrawal with visual/auditory hallucinations.  He was admitted per [facility] for management of alcohol withdrawal and psychiatric consultation.  Afternoon of 2/24 patient began experiencing worsening alcohol withdrawal symptoms with CIWA elevated at 33.  This was accompanied with significant agitation and combativeness for which security was called.  Patient is currently IVC.  PCCM consulted for further management and need for Precedex drip  Pertinent  Medical History  Bipolar disease HIV Alcohol abuse Subdural hematoma Seizure  Significant Hospital Events: Including procedures, antibiotic start and stop dates in addition to other pertinent events   2/22 admitted for suicidal ideations and alcohol withdrawal 2/24 PCCM consulted for management of Precedex drip  Interim History / Subjective:   Remains on Precedex drip. Required intermittent Ativan. Psych consult reviewed   Objective   Blood pressure 114/78, pulse 62, temperature 97.8 F (36.6 C), temperature source Oral, resp. rate (!) 22, height 6' (1.829 m), weight 68.5 kg, SpO2 95 %.        Intake/Output Summary (Last 24 hours) at 01/04/2022 1010 Last data filed at 01/04/2022 0911 Gross per 24 hour  Intake 1847.1 ml  Output 1000 ml  Net 847.1 ml    Filed Weights   12/31/21 0025 12/31/21 0938  Weight: 68 kg 68.5 kg    Examination: General: Acute ill-appearing adult male lying in bed in no distress HEENT: [place]/AT, MM pink/moist, PERRL,  Neuro: Somnolent, grossly nonfocal but can hold conversation  intermittently when aroused CV: s1s2 regular rate and rhythm, no murmur, rubs, or gallops,  PULM: No accessory muscle use, clear breath sounds bilateral GI: soft, bowel sounds active in all 4 quadrants, non-tender, non-distended Extremities: warm/dry, no edema , no tremors Skin: no rashes or lesions  Labs show mild hyponatremia otherwise normal electrolytes, WBC count low, stable hemoglobin  Resolved Hospital Problem list     Assessment & Plan:  Acute alcohol withdrawal with associated delirium/hallucinations  P: Attempt to taper Precedex drip Supplement thiamine, folate, multivitamin Seizure precautions Alcohol cessation education when appropriate Monitor CIWA scale and use Ativan as needed -We will add standing Ativan  Leukopenia , improved Thrombocytopenia Macrocytosis P: Trend CBC   Transaminitis -Felt secondary to chronic alcohol use P: Avoid hepatotoxins Intermittently trend LFTs  Suicidal ideations P: Psychiatry following and  IVC as of 2/23 Suicide sitter He is not agreeable to mental health services or alcohol rehab  History of HIV -Currently not on medical therapy, last CD4 count 214 P: Outpatient follow-up with ID, noncompliance noted   Best Practice (right click and "Reselect all SmartList Selections" daily)   Diet/type: NPO DVT prophylaxis: SCD GI prophylaxis: N/A Lines: N/A Foley:  N/A Code Status:  full code Last date of multidisciplinary goals of care discussion: Pending   Labs   CBC: Recent Labs  Lab 12/31/21 0033 12/31/21 1044 01/01/22 0235 01/02/22 0230 01/03/22 0242 01/04/22 0303  WBC 3.6* 2.8* 2.7* 2.5* 4.2 3.1*  NEUTROABS 1.5*  --   --   --   --   --   HGB 14.4 12.7* 13.1 13.8 12.8* 13.5  HCT 41.8 37.7* 38.3* 41.2 38.7*  39.5  MCV 101.5* 102.7* 103.0* 103.5* 104.9* 103.4*  PLT 97* 81* 70* 72* 82* 107*     Basic Metabolic Panel: Recent Labs  Lab 12/31/21 1044 01/01/22 0235 01/02/22 0230 01/03/22 0242 01/04/22 0303   NA 138 133* 135 134* 133*  K 3.6 3.5 3.9 4.0 3.9  CL 104 102 105 105 105  CO2 25 25 22 24  21*  GLUCOSE 95 96 115* 113* 103*  BUN 9 6 6 12 15   CREATININE 0.54* 0.44* 0.55* 0.63 0.61  CALCIUM 8.1* 9.0 9.3 8.8* 9.1  MG 1.8  --   --   --  2.0  PHOS 4.5  --   --   --   --     GFR: Estimated Creatinine Clearance: 124.9 mL/min (by C-G formula based on SCr of 0.61 mg/dL). Recent Labs  Lab 01/01/22 0235 01/02/22 0230 01/03/22 0242 01/04/22 0303  WBC 2.7* 2.5* 4.2 3.1*     Liver Function Tests: Recent Labs  Lab 12/31/21 1044 01/01/22 0235 01/02/22 0230 01/03/22 0242 01/04/22 0303  AST 129* 88* 70* 73* 75*  ALT 85* 74* 69* 80* 90*  ALKPHOS 44 46 51 52 53  BILITOT 0.6 1.0 1.0 0.8 0.6  PROT 6.7 6.6 7.2 6.7 7.0  ALBUMIN 3.5 3.6 3.8 3.5 3.5    Recent Labs  Lab 12/31/21 0033 01/01/22 0235 01/02/22 0230  LIPASE 116* 84* 57*    No results for input(s): AMMONIA in the last 168 hours.  ABG    Component Value Date/Time   HCO3 27.1 02/12/2021 0056   TCO2 23 04/29/2021 2105   O2SAT 100.0 02/12/2021 0056      Coagulation Profile: Recent Labs  Lab 12/31/21 1044  INR 1.0     Cardiac Enzymes: Recent Labs  Lab 12/31/21 1045  CKTOTAL 221     HbA1C: Hgb A1c MFr Bld  Date/Time Value Ref Range Status  07/26/2021 06:53 AM 4.3 (L) 4.8 - 5.6 % Final    Comment:    (NOTE) Pre diabetes:          5.7%-6.4%  Diabetes:              >6.4%  Glycemic control for   <7.0% adults with diabetes   03/12/2021 03:57 AM 5.1 4.8 - 5.6 % Final    Comment:    (NOTE) Pre diabetes:          5.7%-6.4%  Diabetes:              >6.4%  Glycemic control for   <7.0% adults with diabetes     CBG: No results for input(s): GLUCAP in the last 168 hours.    Kara Mead MD. Shade Flood. [facility] Pager : [phone]  If no response to pager , please call [phone] until 7 pm After 7:00 pm call Elink  [phone]    01/04/2022, 10:10 AM

## 2022-01-05 MED ORDER — ENOXAPARIN SODIUM 40 MG/0.4ML IJ SOSY
40.0000 mg | PREFILLED_SYRINGE | Freq: Every day | INTRAMUSCULAR | Status: DC
  Administered 2022-01-05 – 2022-01-06 (×2): 40 mg via SUBCUTANEOUS
  Filled 2022-01-05 (×2): qty 0.4

## 2022-01-05 MED ORDER — CHLORHEXIDINE GLUCONATE CLOTH 2 % EX PADS
6.0000 | MEDICATED_PAD | Freq: Every day | CUTANEOUS | Status: DC
  Administered 2022-01-06: 6 via TOPICAL

## 2022-01-05 MED ORDER — LORAZEPAM 1 MG PO TABS
2.0000 mg | ORAL_TABLET | Freq: Three times a day (TID) | ORAL | Status: DC
Start: 2022-01-05 — End: 2022-01-06
  Administered 2022-01-05 – 2022-01-06 (×3): 2 mg via ORAL
  Filled 2022-01-05 (×3): qty 2

## 2022-01-05 MED ORDER — MELATONIN 3 MG PO TABS
3.0000 mg | ORAL_TABLET | Freq: Every day | ORAL | Status: DC
  Filled 2022-01-05: qty 1

## 2022-01-05 NOTE — Progress Notes (Signed)
NAME:  Gary [name], MRN:  [id number], DOB:  1986-05-19, LOS: 4 ADMISSION DATE:  12/31/2021, CONSULTATION DATE:  01/02/2022 REFERRING MD:  Dr. Jonathon Bellows, CHIEF COMPLAINT: Alcohol withdrawal  History of Present Illness:  Gary [name] is a 36 year old male with a past medical history significant for bipolar disease, HIV, alcohol abuse, subdural hematoma and seizures who presented to the ED for complaints of suicidal ideation alcohol withdrawal with visual/auditory hallucinations.  He was admitted per [facility] for management of alcohol withdrawal and psychiatric consultation.  Afternoon of 2/24 patient began experiencing worsening alcohol withdrawal symptoms with CIWA elevated at 33.  This was accompanied with significant agitation and combativeness for which security was called.  Patient is currently IVC.  PCCM consulted for further management and need for Precedex drip  Pertinent  Medical History  Bipolar disease HIV Alcohol abuse Subdural hematoma Seizure  Significant Hospital Events: Including procedures, antibiotic start and stop dates in addition to other pertinent events   2/22 admitted for suicidal ideations and alcohol withdrawal 2/24 PCCM consulted for management of Precedex drip 2/26 added standing Ativan 1 mg 3 times daily  Interim History / Subjective:   Remains on Precedex drip Afebrile Required 15 mg of Ativan last 24 hours   Objective   Blood pressure 113/76, pulse (!) 55, temperature 97.9 F (36.6 C), temperature source Oral, resp. rate 16, height 6' (1.829 m), weight 68.5 kg, SpO2 96 %.        Intake/Output Summary (Last 24 hours) at 01/05/2022 1021 Last data filed at 01/05/2022 0355 Gross per 24 hour  Intake 1298.54 ml  Output 600 ml  Net 698.54 ml    Filed Weights   12/31/21 0025 12/31/21 0938  Weight: 68 kg 68.5 kg    Examination: General: Acute ill-appearing adult male lying in bed in no distress HEENT: [place]/AT, MM pink/moist, PERRL,  Neuro: Somnolent,  grossly nonfocal , able to converse CV: s1s2 regular rate and rhythm, no murmur, rubs, or gallops,  PULM: No accessory muscle use, clear breath sounds bilateral GI: soft, bowel sounds active in all 4 quadrants, non-tender, non-distended Extremities: warm/dry, no edema , no tremors Skin: no rashes or lesions  Labs show mild hyponatremia otherwise normal electrolytes, mild leukopenia  Resolved Hospital Problem list     Assessment & Plan:  Acute alcohol withdrawal with associated delirium/hallucinations  P: Attempt to taper Precedex drip daily Supplement thiamine, folate, multivitamin Seizure precautions Alcohol cessation education when appropriate Monitor CIWA scale and use Ativan as needed -Increase standing Ativan to 2 mg 3 times daily -We will add phenobarb if needed  Leukopenia , improved Thrombocytopenia Macrocytosis P: Trend CBC   Acute alcoholic hepatitis/ Transaminitis -Felt secondary to chronic alcohol use P: Avoid hepatotoxins   Suicidal ideations P: Psychiatry following and  IVC as of 2/23 Suicide sitter He is not agreeable to mental health services or alcohol rehab -reassess for inpatient behavioral health when over detox -He is homeless and discharge will be an issue  History of HIV -Currently not on medical therapy, last CD4 count 214 P: Outpatient follow-up with ID, noncompliance noted   Best Practice (right click and "Reselect all SmartList Selections" daily)   Diet/type: NPO DVT prophylaxis: SCD GI prophylaxis: N/A Lines: N/A Foley:  N/A Code Status:  full code Last date of multidisciplinary goals of care discussion: Pending   Labs   CBC: Recent Labs  Lab 12/31/21 0033 12/31/21 1044 01/01/22 0235 01/02/22 0230 01/03/22 0242 01/04/22 0303  WBC 3.6* 2.8* 2.7* 2.5*  4.2 3.1*  NEUTROABS 1.5*  --   --   --   --   --   HGB 14.4 12.7* 13.1 13.8 12.8* 13.5  HCT 41.8 37.7* 38.3* 41.2 38.7* 39.5  MCV 101.5* 102.7* 103.0* 103.5* 104.9*  103.4*  PLT 97* 81* 70* 72* 82* 107*     Basic Metabolic Panel: Recent Labs  Lab 12/31/21 1044 01/01/22 0235 01/02/22 0230 01/03/22 0242 01/04/22 0303  NA 138 133* 135 134* 133*  K 3.6 3.5 3.9 4.0 3.9  CL 104 102 105 105 105  CO2 25 25 22 24  21*  GLUCOSE 95 96 115* 113* 103*  BUN 9 6 6 12 15   CREATININE 0.54* 0.44* 0.55* 0.63 0.61  CALCIUM 8.1* 9.0 9.3 8.8* 9.1  MG 1.8  --   --   --  2.0  PHOS 4.5  --   --   --   --     GFR: Estimated Creatinine Clearance: 124.9 mL/min (by C-G formula based on SCr of 0.61 mg/dL). Recent Labs  Lab 01/01/22 0235 01/02/22 0230 01/03/22 0242 01/04/22 0303  WBC 2.7* 2.5* 4.2 3.1*     Liver Function Tests: Recent Labs  Lab 12/31/21 1044 01/01/22 0235 01/02/22 0230 01/03/22 0242 01/04/22 0303  AST 129* 88* 70* 73* 75*  ALT 85* 74* 69* 80* 90*  ALKPHOS 44 46 51 52 53  BILITOT 0.6 1.0 1.0 0.8 0.6  PROT 6.7 6.6 7.2 6.7 7.0  ALBUMIN 3.5 3.6 3.8 3.5 3.5    Recent Labs  Lab 12/31/21 0033 01/01/22 0235 01/02/22 0230  LIPASE 116* 84* 57*    No results for input(s): AMMONIA in the last 168 hours.  ABG    Component Value Date/Time   HCO3 27.1 02/12/2021 0056   TCO2 23 04/29/2021 2105   O2SAT 100.0 02/12/2021 0056      Coagulation Profile: Recent Labs  Lab 12/31/21 1044  INR 1.0     Cardiac Enzymes: Recent Labs  Lab 12/31/21 1045  CKTOTAL 221     HbA1C: Hgb A1c MFr Bld  Date/Time Value Ref Range Status  07/26/2021 06:53 AM 4.3 (L) 4.8 - 5.6 % Final    Comment:    (NOTE) Pre diabetes:          5.7%-6.4%  Diabetes:              >6.4%  Glycemic control for   <7.0% adults with diabetes   03/12/2021 03:57 AM 5.1 4.8 - 5.6 % Final    Comment:    (NOTE) Pre diabetes:          5.7%-6.4%  Diabetes:              >6.4%  Glycemic control for   <7.0% adults with diabetes     CBG: No results for input(s): GLUCAP in the last 168 hours.    07/28/2021 MD. 05/12/2021. [facility] Pager : [phone]  If no response to pager , please call [phone] until 7 pm After 7:00 pm call Elink  [phone]    01/05/2022, 10:21 AM

## 2022-01-05 NOTE — Progress Notes (Signed)
PROGRESS NOTE Gary [name]  SKA:[id number] DOB: 1986/09/11 DOA: 12/31/2021 PCP: Pcp, No   Brief Narrative/Hospital Course: 36 y.o. male with medical history significant of alcohol abuse, tobacco abuse, polysubstance abuse, HIV disease not on treatment, history of subdural hematoma, history of seizures, treatment noncompliance p/w suicidal ideation and alcohol withdrawal with visual/auditory hallucinations,was seeing clowns. Reports he drinks at least 2/5 of liquor daily. C/o abdominal pain associated with nausea, but no emesis, diarrhea, constipation, melena or hematochezia. In ed: Initial vital signs stable afebrile, started on lorazepam per CIWA protocol, labs- CBC leukopenia, hemoconcentration, thrombocytopenia- wbc 3.6, hb 14.4 g/dL and platelets 97.  Troponin x2 was normal.  ethyl alcohol was 482 mg/dL.  lipase  84, ast/alt 182/102, UDS positive for benzo.MRI brain findings that may represent age advanced chronic white matter microangiopathy, migraines, prior infection/inflammation, among other etiologies  Patient admitted for further management psychiatry was consulted. 2/23- Patient was placed under IVC given that he was having alcohol withdrawal and wanted to [facility] medical advice, remains with one-to-one sitter. 2/24 CIWA was very high up to 33 and placed on Precedex drip     Subjective:  Seen and examined this morning, resting.  One-to-one at bedside. He was asking for higher dose of Ativan, scale was at 24 this morning and received Ativan. No complaints tolerating p.o. Under IVC since 2/23, on Precedex drip since 2/24 and gentle IV fluid hydration  Assessment and Plan: * Alcohol abuse with withdrawal (HCC)- (present on admission) Alcohol intoxication-last EtOH 2/20 2 AM Alcohol withdrawal delirium/hallucination recurrent admission for alcohol withdrawal delirium and usually needs Precedex: 2/24 CIWA was very high up to 33 and placed on Precedex drip-PCCM managing, appreciate  input. CIWA at 4 am 23 Cont current detox- precedex gtt, ciwa ativan, also on 1 mg ativan po tid Continue folate supplement PCCM and psychiatry following.   Alcohol intoxication with delirium (HCC) See above  Neutropenia (HCC)- (present on admission) leukopenia Thrombocytopenia: Likely secondary to chronic alcoholism.  Overall stable.  Monitor. Recent Labs  Lab 01/02/22 0230 01/03/22 0242 01/04/22 0303  HGB 13.8 12.8* 13.5  HCT 41.2 38.7* 39.5  WBC 2.5* 4.2 3.1*  PLT 72* 82* 107*    Thrombocytopenia (HCC)- (present on admission) See above  Transaminitis- (present on admission) Due to his alcohol abuse, AST/ALT mildly elevated but stable.  Trend.  Macrocytosis- (present on admission) From alcohol abuse.monitor.  Suicidal ideation endorseD suicidal ideation on admission seen by psychiatry and ON one-to-one suicide precaution. on IVC since 01/01/22.  Psychiatry following.  Improving mentation  Cigarette smoker- (present on admission) Cessation counseling  HIV disease (HCC)- (present on admission) Currently not on medical therapy.Last CD4 215 CD8 44 Seen by PCP/Whitney 10/2021- per note " She spoke to ID and was advised to reestablish HART therapy by following with ID as OP- and ID did not recommend continuing Hart therapy if patient has not been compliant" he IS instructed to will follow-up with his infectious disease physician upon discharge   DVT prophylaxis: SCDs Start: 12/31/21 0732.  No chemical prophylaxis due to thrombocytopenia Code Status:   Code Status: Full Code Family Communication: plan of care discussed with patient.  Disposition: Currently not medically stable for discharge. Status is: Inpatient in the stepdown The patient will remain hospitalized for ongoing management of alcohol withdrawal/suicidal ideation, under IVC since 2/23 will need a renewal in 7 days if still SI- psych following.   Objective: Vitals last 24 hrs: Vitals:   01/04/22 2300  01/05/22 0011 01/05/22 0300 01/05/22  0400  BP:  120/80  138/84  Pulse:  (!) 57  (!) 53  Resp:  17  16  Temp: 98.3 F (36.8 C)  97.8 F (36.6 C)   TempSrc: Oral  Oral   SpO2:  96%  94%  Weight:      Height:       Weight change:   Physical Examination: General exam: Alert awake,older than stated age, weak appearing. HEENT:Oral mucosa moist, Ear/Nose WNL grossly, dentition normal. Respiratory system: bilaterally clear,no use of accessory muscle Cardiovascular system: S1 & S2 +, No JVD,. Gastrointestinal system: Abdomen soft,NT,ND, BS+ Nervous System:Alert, awake, moving extremities and grossly nonfocal Extremities: edema neg,distal peripheral pulses palpable.  Skin: No rashes,no icterus. MSK: Normal muscle bulk,tone, power  Medications reviewed:  Scheduled Meds:  Chlorhexidine Gluconate Cloth  6 each Topical Q2000   folic acid  1 mg Oral Daily   LORazepam  1 mg Oral TID   multivitamin with minerals  1 tablet Oral Daily   thiamine  100 mg Oral Daily   Or   thiamine  100 mg Intravenous Daily   vitamin B-12  1,000 mcg Oral Daily   Continuous Infusions:  dexmedetomidine (PRECEDEX) IV infusion 0.8 mcg/kg/hr (01/05/22 0400)   dextrose 5 % and 0.9 % NaCl with KCl 20 mEq/L 50 mL/hr at 01/05/22 [id number]      Diet Order             Diet regular Room service appropriate? Yes; Fluid consistency: Thin  Diet effective now                   Intake/Output Summary (Last 24 hours) at 01/05/2022 0732 Last data filed at 01/05/2022 0400 Gross per 24 hour  Intake 1298.99 ml  Output 600 ml  Net 698.99 ml    Net IO Since Admission: 3,808.81 mL [01/05/22 0732]  Wt Readings from Last 3 Encounters:  12/31/21 68.5 kg  11/08/21 74.4 kg  09/30/21 70.3 kg     Unresulted Labs (From admission, onward)    None      Data Reviewed: I have personally reviewed following labs and imaging studies CBC: Recent Labs  Lab 12/31/21 0033 12/31/21 1044 01/01/22 0235 01/02/22 0230  01/03/22 0242 01/04/22 0303  WBC 3.6* 2.8* 2.7* 2.5* 4.2 3.1*  NEUTROABS 1.5*  --   --   --   --   --   HGB 14.4 12.7* 13.1 13.8 12.8* 13.5  HCT 41.8 37.7* 38.3* 41.2 38.7* 39.5  MCV 101.5* 102.7* 103.0* 103.5* 104.9* 103.4*  PLT 97* 81* 70* 72* 82* 107*    Basic Metabolic Panel: Recent Labs  Lab 12/31/21 1044 01/01/22 0235 01/02/22 0230 01/03/22 0242 01/04/22 0303  NA 138 133* 135 134* 133*  K 3.6 3.5 3.9 4.0 3.9  CL 104 102 105 105 105  CO2 25 25 22 24  21*  GLUCOSE 95 96 115* 113* 103*  BUN 9 6 6 12 15   CREATININE 0.54* 0.44* 0.55* 0.63 0.61  CALCIUM 8.1* 9.0 9.3 8.8* 9.1  MG 1.8  --   --   --  2.0  PHOS 4.5  --   --   --   --     GFR: Estimated Creatinine Clearance: 124.9 mL/min (by C-G formula based on SCr of 0.61 mg/dL). Liver Function Tests: Recent Labs  Lab 12/31/21 1044 01/01/22 0235 01/02/22 0230 01/03/22 0242 01/04/22 0303  AST 129* 88* 70* 73* 75*  ALT 85* 74* 69* 80* 90*  ALKPHOS 44 46  51 52 53  BILITOT 0.6 1.0 1.0 0.8 0.6  PROT 6.7 6.6 7.2 6.7 7.0  ALBUMIN 3.5 3.6 3.8 3.5 3.5    Recent Labs  Lab 12/31/21 0033 01/01/22 0235 01/02/22 0230  LIPASE 116* 84* 57*    No results for input(s): AMMONIA in the last 168 hours. Coagulation Profile: Recent Labs  Lab 12/31/21 1044  INR 1.0    Cardiac Enzymes: Recent Labs  Lab 12/31/21 1045  CKTOTAL 221    BNP (last 3 results) No results for input(s): PROBNP in the last 8760 hours. HbA1C: No results for input(s): HGBA1C in the last 72 hours. CBG: No results for input(s): GLUCAP in the last 168 hours. Lipid Profile: No results for input(s): CHOL, HDL, LDLCALC, TRIG, CHOLHDL, LDLDIRECT in the last 72 hours. Thyroid Function Tests: No results for input(s): TSH, T4TOTAL, FREET4, T3FREE, THYROIDAB in the last 72 hours.  Anemia Panel: No results for input(s): VITAMINB12, FOLATE, FERRITIN, TIBC, IRON, RETICCTPCT in the last 72 hours.  Sepsis Labs: No results for input(s): PROCALCITON,  LATICACIDVEN in the last 168 hours.  Recent Results (from the past 240 hour(s))  Resp Panel by RT-PCR (Flu A&B, Covid) Nasopharyngeal Swab     Status: None   Collection Time: 12/31/21  8:06 AM   Specimen: Nasopharyngeal Swab; Nasopharyngeal(NP) swabs in vial transport medium  Result Value Ref Range Status   SARS Coronavirus 2 by RT PCR NEGATIVE NEGATIVE Final    Comment: (NOTE) SARS-CoV-2 target nucleic acids are NOT DETECTED.  The SARS-CoV-2 RNA is generally detectable in upper respiratory specimens during the acute phase of infection. The lowest concentration of SARS-CoV-2 viral copies this assay can detect is 138 copies/mL. A negative result does not preclude SARS-Cov-2 infection and should not be used as the sole basis for treatment or other patient management decisions. A negative result may occur with  improper specimen collection/handling, submission of specimen other than nasopharyngeal swab, presence of viral mutation(s) within the areas targeted by this assay, and inadequate number of viral copies(<138 copies/mL). A negative result must be combined with clinical observations, patient history, and epidemiological information. The expected result is Negative.  Fact Sheet for Patients:  BloggerCourse.com  Fact Sheet for Healthcare Providers:  SeriousBroker.it  This test is no t yet approved or cleared by the [place] FDA and  has been authorized for detection and/or diagnosis of SARS-CoV-2 by FDA under an Emergency Use Authorization (EUA). This EUA will remain  in effect (meaning this test can be used) for the duration of the COVID-19 declaration under Section 564(b)(1) of the Act, 21 U.S.C.section 360bbb-3(b)(1), unless the authorization is terminated  or revoked sooner.       Influenza A by PCR NEGATIVE NEGATIVE Final   Influenza B by PCR NEGATIVE NEGATIVE Final    Comment: (NOTE) The Xpert Xpress  SARS-CoV-2/FLU/RSV plus assay is intended as an aid in the diagnosis of influenza from Nasopharyngeal swab specimens and should not be used as a sole basis for treatment. Nasal washings and aspirates are unacceptable for Xpert Xpress SARS-CoV-2/FLU/RSV testing.  Fact Sheet for Patients: BloggerCourse.com  Fact Sheet for Healthcare Providers: SeriousBroker.it  This test is not yet approved or cleared by the [place] FDA and has been authorized for detection and/or diagnosis of SARS-CoV-2 by FDA under an Emergency Use Authorization (EUA). This EUA will remain in effect (meaning this test can be used) for the duration of the COVID-19 declaration under Section 564(b)(1) of the Act, 21 U.S.C. section 360bbb-3(b)(1), unless the  authorization is terminated or revoked.  Performed at [facility], [address], [place], [place] [address]   MRSA Next Gen by PCR, Nasal     Status: None   Collection Time: 12/31/21  9:46 AM   Specimen: Nasal Mucosa; Nasal Swab  Result Value Ref Range Status   MRSA by PCR Next Gen NOT DETECTED NOT DETECTED Final    Comment: (NOTE) The GeneXpert MRSA Assay (FDA approved for NASAL specimens only), is one component of a comprehensive MRSA colonization surveillance program. It is not intended to diagnose MRSA infection nor to guide or monitor treatment for MRSA infections. Test performance is not FDA approved in patients less than 86 years old. Performed at [facility], [address], [place], [place] [address]      Antimicrobials: Anti-infectives (From admission, onward)    None      Culture/Microbiology Radiology Studies: No results found.   LOS: 4 days   Lanae Boast, MD [facility]  01/05/2022, 7:32 AM

## 2022-01-05 NOTE — TOC Progression Note (Addendum)
Transition of Care [facility]) - Progression Note    Patient Details  Name: Gary [name] MRN: [id number] Date of Birth: 1986/05/20  [facility] CM/SW Contact  Golda Acre, RN Phone Number: 01/05/2022, 7:39 AM  Clinical Narrative:    Iv sedation on going will need substance abuse info and resources when stable. Patient was ivc'd on [phone] for 7 seven days.  Next review for ivc January 08, 2022  Expected Discharge Plan: Homeless Shelter Barriers to Discharge: Continued Medical Work up  Expected Discharge Plan and Services Expected Discharge Plan: Homeless Shelter   Discharge Planning Services: CM Consult   Living arrangements for the past 2 months: Homeless                                       Social Determinants of Health (SDOH) Interventions    Readmission Risk Interventions No flowsheet data found.

## 2022-01-05 NOTE — Consult Note (Signed)
[facility] Face-to-Face Psychiatry Consult   Reason for Consult: Acute psychosis/hallucinations in the setting of alcohol withdrawal Referring Physician:  Lanae Boast, MD Patient Identification: Gary [name] MRN:  [id number] Principal Diagnosis: Alcohol abuse with withdrawal [facility]) Diagnosis:  Principal Problem:   Alcohol abuse with withdrawal (HCC) Active Problems:   HIV disease (HCC)   Cigarette smoker   Alcohol intoxication with delirium (HCC)   Thrombocytopenia (HCC)   Transaminitis   Suicidal ideation   Neutropenia (HCC)   Macrocytosis   Alcohol withdrawal (HCC)  Total Time Spent in Direct Patient Care:  I personally spent 25 minutes on the unit in direct patient care. The direct patient care time included face-to-face time with the patient, reviewing the patient's chart, communicating with other professionals, and coordinating care. Greater than 50% of this time was spent in counseling or coordinating care with the patient regarding goals of hospitalization, psycho-education, and discharge planning needs.     Subjective: "I do not want to go to rehab, because I got stuck to do.  I would do better if they would just let me leave here and get a drink."  HPI:   Gary [name] is a 36 y.o. male patient admitted with medical history significant of alcohol abuse, tobacco abuse, polysubstance abuse, HIV disease not on treatment, history of subdural hematoma, history of seizures, treatment noncompliance p/w suicidal ideation and alcohol withdrawal with visual/auditory hallucinations,was seeing clowns. Reports he drinks at least 2/5 of liquor daily. C/o abdominal pain associated with nausea, but no emesis, diarrhea, constipation, melena or hematochezia.  On reevaluation today 01/03/2022, Patient states Im doing  better, just irritable. He identifies his mood as irritable, and his affect is congruent. He denies any physical symptoms of acute alcohol withdraw at this time expect irritability. He  denies any auditory, visual or tactile hallucinations at this time. He is currently does appear to have some volition to initiate changes in his future, however he lacks no apparent interest or motivation to quit drinking at this time. He understands that he is at high risk for complication related to seizures, but thinks the risk is lower if he can get a drink. He describes his irritability as "cursing and does not show any harm or intent to hurt others at this time or himself. He states he ancipitates the medical team releasing him tomorrow. When asking if there is anything else he need from psychiatry he states " Yes talk to the medical doctors so they can let me go today. " He states his goal is to cut back on smoking, to make it easier to cut back on his alcohol intake. We did review treatment options to include antabuse, gabapentin and naltrexone in which he denied the former two. He was unaware about naltrexone and encouraged to consider it for rewards and  cravings of alcohol use.  Patient specifically denies any suicidal ideation, plan, or intent.  He denies ever drinking alcohol as an attempt to end his life.  He denies any homicidal ideation.  He denies any auditory or visual hallucinations today.  Past Psychiatric History: Alcohol abuse, tobacco abuse, polysubstance abuse  Risk to Self: Denies Risk to Others: Denies Prior Inpatient Therapy: 1 inpatient psychiatric admission 07/26/2021 discharged on doxepin milligrams at bedtime for insomnia and nausea; and gabapentin 300 mg 3 times daily for alcohol withdrawal syndrome  Patient has multiple inpatient admissions for alcohol detoxification, he is going to multiple alcohol rehabilitation programs  Prior Outpatient Therapy: None consistent  Past Medical  History:  Past Medical History:  Diagnosis Date   Alcohol abuse    HIV (human immunodeficiency virus infection) (HCC)    Seizures (HCC)    Subdural hematoma     Past Surgical History:   Procedure Laterality Date   INCISION AND DRAINAGE PERIRECTAL ABSCESS N/A 09/24/2016   Procedure: IRRIGATION AND DEBRIDEMENT PERIRECTAL ABSCESS;  Surgeon: Ricarda Frameharles Woodham, MD;  Location: [facility] ORS;  Service: General;  Laterality: N/A;   none     Family History:  Family History  Problem Relation Age of Onset   Alcohol abuse Mother    Alcohol abuse Father    Family Psychiatric  History:   Brother- Bipolar Disorder and EtOH abuse    Social History:  Social History   Substance and Sexual Activity  Alcohol Use Yes   Comment: "I drink all I can"     Social History   Substance and Sexual Activity  Drug Use Yes   Types: Methamphetamines, Marijuana   Comment: last used 04/10/2019    Social History   Socioeconomic History   Marital status: Unknown    Spouse name: Not on file   Number of children: Not on file   Years of education: Not on file   Highest education level: Not on file  Occupational History   Occupation: unemployed  Tobacco Use   Smoking status: Every Day    Packs/day: 0.50    Types: Cigarettes   Smokeless tobacco: Never   Tobacco comments:    unable to smoke while incarcerated 6+ months 02/13/20  Vaping Use   Vaping Use: Never used  Substance and Sexual Activity   Alcohol use: Yes    Comment: "I drink all I can"   Drug use: Yes    Types: Methamphetamines, Marijuana    Comment: last used 04/10/2019   Sexual activity: Yes    Partners: Male, Male    Comment: declined condoms  03/2021  Other Topics Concern   Not on file  Social History Narrative   "Currently living on the streets"   Independent at baseline   Social Determinants of Health   Financial Resource Strain: Not on file  Food Insecurity: Not on file  Transportation Needs: Not on file  Physical Activity: Not on file  Stress: Not on file  Social Connections: Not on file   Additional Social History:    Allergies:   Allergies  Allergen Reactions   Tegretol [Carbamazepine] Other (See Comments)     Caused vertigo for 2 days after taking it   Caffeine Palpitations    Labs:  Results for orders placed or performed during the hospital encounter of 12/31/21 (from the past 48 hour(s))  Comprehensive metabolic panel     Status: Abnormal   Collection Time: 01/04/22  3:03 AM  Result Value Ref Range   Sodium 133 (L) 135 - 145 mmol/L   Potassium 3.9 3.5 - 5.1 mmol/L   Chloride 105 98 - 111 mmol/L   CO2 21 (L) 22 - 32 mmol/L   Glucose, Bld 103 (H) 70 - 99 mg/dL    Comment: Glucose reference range applies only to samples taken after fasting for at least 8 hours.   BUN 15 6 - 20 mg/dL   Creatinine, Ser 1.610.61 0.61 - 1.24 mg/dL   Calcium 9.1 8.9 - 09.610.3 mg/dL   Total Protein 7.0 6.5 - 8.1 g/dL   Albumin 3.5 3.5 - 5.0 g/dL   AST 75 (H) 15 - 41 U/L   ALT 90 (H) 0 -  44 U/L   Alkaline Phosphatase 53 38 - 126 U/L   Total Bilirubin 0.6 0.3 - 1.2 mg/dL   GFR, Estimated >21 >19 mL/min    Comment: (NOTE) Calculated using the CKD-EPI Creatinine Equation (2021)    Anion gap 7 5 - 15    Comment: Performed at [facility], [address] [place], [place] [address]  CBC     Status: Abnormal   Collection Time: 01/04/22  3:03 AM  Result Value Ref Range   WBC 3.1 (L) 4.0 - 10.5 K/uL   RBC 3.82 (L) 4.22 - 5.81 MIL/uL   Hemoglobin 13.5 13.0 - 17.0 g/dL   HCT 81.4 48.1 - 85.6 %   MCV 103.4 (H) 80.0 - 100.0 fL   MCH 35.3 (H) 26.0 - 34.0 pg   MCHC 34.2 30.0 - 36.0 g/dL   RDW 31.4 97.0 - 26.3 %   Platelets 107 (L) 150 - 400 K/uL    Comment: Immature Platelet Fraction may be clinically indicated, consider ordering this additional test [id number] CONSISTENT WITH PREVIOUS RESULT    nRBC 0.0 0.0 - 0.2 %    Comment: Performed at [facility], [address], [place], [place] [address]  Magnesium     Status: None   Collection Time: 01/04/22  3:03 AM  Result Value Ref Range   Magnesium 2.0 1.7 - 2.4 mg/dL    Comment: Performed at [facility],  [address] [place], [place] [address]    Current Facility-Administered Medications  Medication Dose Route Frequency Provider Last Rate Last Admin   haloperidol (HALDOL) tablet 5 mg  5 mg Oral Q6H PRN Maryagnes Amos, FNP   5 mg at 12/31/21 2144   And   benztropine (COGENTIN) tablet 1 mg  1 mg Oral Q6H PRN Maryagnes Amos, FNP   1 mg at 01/03/22 2226   Chlorhexidine Gluconate Cloth 2 % PADS 6 each  6 each Topical Q2000 Kc, Ramesh, MD       dexmedetomidine (PRECEDEX) 400 MCG/100ML (4 mcg/mL) infusion  0.4-1.2 mcg/kg/hr Intravenous Titrated Kc, Ramesh, MD 6.85 mL/hr at 01/05/22 1308 0.4 mcg/kg/hr at 01/05/22 1308   dextrose 5 % and 0.9 % NaCl with KCl 20 mEq/L infusion   Intravenous Continuous Kc, Ramesh, MD 50 mL/hr at 01/05/22 1200 Infusion Verify at 01/05/22 1200   enoxaparin (LOVENOX) injection 40 mg  40 mg Subcutaneous Daily Kc, Ramesh, MD   40 mg at 01/05/22 1347   folic acid (FOLVITE) tablet 1 mg  1 mg Oral Daily Bobette Mo, MD   1 mg at 01/05/22 0847   LORazepam (ATIVAN) tablet 1-4 mg  1-4 mg Oral Q1H PRN Oretha Milch, MD       Or   LORazepam (ATIVAN) injection 1-4 mg  1-4 mg Intravenous Q1H PRN Oretha Milch, MD   2 mg at 01/05/22 1343   LORazepam (ATIVAN) tablet 2 mg  2 mg Oral TID Oretha Milch, MD       metoprolol tartrate (LOPRESSOR) injection 5 mg  5 mg Intravenous Q6H PRN Bobette Mo, MD       multivitamin with minerals tablet 1 tablet  1 tablet Oral Daily Bobette Mo, MD   1 tablet at 01/05/22 0847   ondansetron [facility]) tablet 4 mg  4 mg Oral Q6H PRN Bobette Mo, MD       Or   ondansetron [facility]) injection 4 mg  4 mg Intravenous Q6H  PRN Bobette Mo, MD   4 mg at 01/02/22 1844   thiamine tablet 100 mg  100 mg Oral Daily Bobette Mo, MD   100 mg at 01/05/22 7893   Or   thiamine (B-1) injection 100 mg  100 mg Intravenous Daily Bobette Mo, MD   100 mg at 01/02/22 1043   vitamin B-12 (CYANOCOBALAMIN)  tablet 1,000 mcg  1,000 mcg Oral Daily Bobette Mo, MD   1,000 mcg at 01/05/22 8101    Musculoskeletal: Strength & Muscle Tone: decreased Gait & Station:  Not assessed, patient on Precedex drip Patient leans: N/A            Psychiatric Specialty Exam:  Presentation  General Appearance: Appropriate for Environment; Casual  Eye Contact:Fair  Speech:Clear and Coherent; Slow  Speech Volume:Decreased  Handedness:Right   Mood and Affect  Mood:Dysphoric; Irritable  Affect:Blunt   Thought Process  Thought Processes:Coherent; Linear  Descriptions of Associations:Intact  Orientation:Full (Time, Place and Person)  Thought Content:Logical  History of Schizophrenia/Schizoaffective disorder:No  Duration of Psychotic Symptoms:Less than six months  Hallucinations:Hallucinations: None  Ideas of Reference:None  Suicidal Thoughts:Suicidal Thoughts: No  Homicidal Thoughts:Homicidal Thoughts: No   Sensorium  Memory:Immediate Fair; Recent Fair; Remote Fair  Judgment:Poor  Insight:Shallow; Poor   Executive Functions  Concentration:Fair  Attention Span:Fair  Recall:Fair  Fund of Knowledge:Fair  Language:Fair   Psychomotor Activity  Psychomotor Activity:Psychomotor Activity: Normal  Assets  Assets:Resilience; Desire for Improvement   Sleep  Sleep:Sleep: Fair  Physical Exam: Physical Exam Vitals and nursing note reviewed.  Constitutional:      Comments: Thin  Cardiovascular:     Rate and Rhythm: Bradycardia present.  Pulmonary:     Effort: Pulmonary effort is normal. No respiratory distress.  Neurological:     General: No focal deficit present.     Mental Status: He is alert and oriented to person, place, and time.   Review of Systems  Constitutional: Negative.   Psychiatric/Behavioral:  Positive for substance abuse. Negative for depression, hallucinations, memory loss and suicidal ideas. The patient is nervous/anxious. The  patient does not have insomnia.   Blood pressure (!) 100/48, pulse 60, temperature (!) 97.5 F (36.4 C), temperature source Oral, resp. rate 18, height 6' (1.829 m), weight 68.5 kg, SpO2 95 %. Body mass index is 20.48 kg/m.  Treatment Plan Summary: -Appreciate social work support and providing resources to the patient for housing options. -Will continue alcohol detox protocol at this time. He also continues to have elevated CIWA scores, and will continue to need inpatient medical services.  Although his ativan reduction has been decreased, he is also on precedex drip which can explain the reduced CIWA scores.  -Continue Haldol and Cogentin for alcohol intoxication, agitation, tremors.  -Will rescind IVC tomorrow, once patient is medically cleared.  -Recommend PT evaluation as patient has been in bed x 5 days.   Disposition: Patient does not meet criteria for psychiatric inpatient admission. -At this time, patient does not desire substance use treatment.  He appears to have capacity for decision making at this time. - Patient is denying any suicidal or homicidal ideation, plan.  Should this change, please reconsult psychiatry for further evaluation - Once patient is medically stable, will rescind IVC and discharge home.  -TOC referral for [facility] services. Patient can follow up at [facility] for outpatient substance abuse therapy if interested.    Maryagnes Amos, FNP 01/05/2022 4:02 PM

## 2022-01-06 LAB — CBC
HCT: 45 % (ref 39.0–52.0)
Hemoglobin: 15.2 g/dL (ref 13.0–17.0)
MCH: 34.7 pg — ABNORMAL HIGH (ref 26.0–34.0)
MCHC: 33.8 g/dL (ref 30.0–36.0)
MCV: 102.7 fL — ABNORMAL HIGH (ref 80.0–100.0)
Platelets: 188 10*3/uL (ref 150–400)
RBC: 4.38 MIL/uL (ref 4.22–5.81)
RDW: 13.2 % (ref 11.5–15.5)
WBC: 3.1 10*3/uL — ABNORMAL LOW (ref 4.0–10.5)
nRBC: 0 % (ref 0.0–0.2)

## 2022-01-06 LAB — BASIC METABOLIC PANEL
Anion gap: 8 (ref 5–15)
BUN: 10 mg/dL (ref 6–20)
CO2: 24 mmol/L (ref 22–32)
Calcium: 9.3 mg/dL (ref 8.9–10.3)
Chloride: 103 mmol/L (ref 98–111)
Creatinine, Ser: 0.69 mg/dL (ref 0.61–1.24)
GFR, Estimated: >60 mL/min (ref >60)
Glucose, Bld: 100 mg/dL — ABNORMAL HIGH (ref 70–99)
Potassium: 4.1 mmol/L (ref 3.5–5.1)
Sodium: 135 mmol/L (ref 135–145)

## 2022-01-06 MED ORDER — PHENOBARBITAL SODIUM 65 MG/ML IJ SOLN
65.0000 mg | Freq: Three times a day (TID) | INTRAMUSCULAR | Status: DC
Start: 2022-01-06 — End: 2022-01-06
  Administered 2022-01-06: 65 mg via INTRAVENOUS
  Filled 2022-01-06: qty 1

## 2022-01-06 NOTE — Progress Notes (Signed)
NAME:  Gary [name], MRN:  [id number], DOB:  07-11-1986, LOS: 5 ADMISSION DATE:  12/31/2021, CONSULTATION DATE:  01/02/2022 REFERRING MD:  Dr. Lupita Leash, CHIEF COMPLAINT: Alcohol withdrawal  History of Present Illness:  Gary [name] is a 36 year old male with a past medical history significant for bipolar disease, HIV, alcohol abuse, subdural hematoma and seizures who presented to the ED for complaints of suicidal ideation alcohol withdrawal with visual/auditory hallucinations.  He was admitted per [facility] for management of alcohol withdrawal and psychiatric consultation.  Afternoon of 2/24 patient began experiencing worsening alcohol withdrawal symptoms with CIWA elevated at 33.  This was accompanied with significant agitation and combativeness for which security was called.  Patient is currently IVC.  PCCM consulted for further management and need for Precedex drip  Pertinent  Medical History  Bipolar disease HIV Alcohol abuse Subdural hematoma Seizure  Significant Hospital Events: Including procedures, antibiotic start and stop dates in addition to other pertinent events   2/22 admitted for suicidal ideations and alcohol withdrawal 2/24 PCCM consulted for management of Precedex drip 2/26 added standing Ativan 1 mg 3 times daily  Interim History / Subjective:   Remains off Precedex drip since 2 PM yesterday. Ativan requirements quite high, more than 20 mg over the last 24 hours Afebrile  Objective   Blood pressure 119/81, pulse (!) 114, temperature 98.6 F (37 C), temperature source Oral, resp. rate 20, height 6' (1.829 m), weight 68.5 kg, SpO2 97 %.        Intake/Output Summary (Last 24 hours) at 01/06/2022 0908 Last data filed at 01/06/2022 0400 Gross per 24 hour  Intake 1065.21 ml  Output 300 ml  Net 765.21 ml    Filed Weights   12/31/21 0025 12/31/21 0938  Weight: 68 kg 68.5 kg    Examination: General: Acute ill-appearing adult male lying in bed in no distress HEENT:  [place]/AT, MM pink/moist, PERRL,  Neuro: Awake, nonfocal, holds conversation, mild asterixis CV: s1s2 regular rate and rhythm, no murmur, rubs, or gallops,  PULM: Clear breath sounds bilateral, no accessory muscle use GI: soft, bowel sounds active in all 4 quadrants, non-tender, non-distended Extremities: warm/dry, no edema , no tremors Skin: no rashes or lesions  Labs show normal electrolytes, mild leukopenia, macrocytosis  Resolved Hospital Problem list     Assessment & Plan:  Acute alcohol withdrawal with associated delirium/hallucinations  P: Discontinue Precedex drip -Increase standing Ativan to 2 mg 4 times daily -Add phenobarb 65 every 8 Supplement thiamine, folate, multivitamin Seizure precautions Alcohol cessation education when appropriate Monitor CIWA scale and use Ativan as needed Big picture here is that he is really not interested in alcohol detox, so at some point he needs to be discharged with a prescription for Ativan.  I explained to him that he is more than 50% detox at this point but he is just not interested in continuing  Acute alcoholic hepatitis/ Transaminitis -Felt secondary to chronic alcohol use P: Avoid hepatotoxins   Suicidal ideations P: Psychiatry following and  IVC as of 2/23 Suicide sitter He is not agreeable to mental health services or alcohol rehab -reassess for inpatient behavioral health when over detox -He is homeless and discharge will be an issue  History of HIV -Currently not on medical therapy, last CD4 count 214 P: Outpatient follow-up with ID, noncompliance noted  PCCM will be available as needed  Best Practice (right click and "Reselect all SmartList Selections" daily)   Diet/type: NPO DVT prophylaxis: SCD GI prophylaxis: N/A  Lines: N/A Foley:  N/A Code Status:  full code Last date of multidisciplinary goals of care discussion: NA  Labs   CBC: Recent Labs  Lab 12/31/21 0033 12/31/21 1044 01/01/22 0235  01/02/22 0230 01/03/22 0242 01/04/22 0303 01/06/22 0245  WBC 3.6*   < > 2.7* 2.5* 4.2 3.1* 3.1*  NEUTROABS 1.5*  --   --   --   --   --   --   HGB 14.4   < > 13.1 13.8 12.8* 13.5 15.2  HCT 41.8   < > 38.3* 41.2 38.7* 39.5 45.0  MCV 101.5*   < > 103.0* 103.5* 104.9* 103.4* 102.7*  PLT 97*   < > 70* 72* 82* 107* 188   < > = values in this interval not displayed.     Basic Metabolic Panel: Recent Labs  Lab 12/31/21 1044 01/01/22 0235 01/02/22 0230 01/03/22 0242 01/04/22 0303 01/06/22 0245  NA 138 133* 135 134* 133* 135  K 3.6 3.5 3.9 4.0 3.9 4.1  CL 104 102 105 105 105 103  CO2 25 25 22 24  21* 24  GLUCOSE 95 96 115* 113* 103* 100*  BUN 9 6 6 12 15 10   CREATININE 0.54* 0.44* 0.55* 0.63 0.61 0.69  CALCIUM 8.1* 9.0 9.3 8.8* 9.1 9.3  MG 1.8  --   --   --  2.0  --   PHOS 4.5  --   --   --   --   --     GFR: Estimated Creatinine Clearance: 124.9 mL/min (by C-G formula based on SCr of 0.69 mg/dL). Recent Labs  Lab 01/02/22 0230 01/03/22 0242 01/04/22 0303 01/06/22 0245  WBC 2.5* 4.2 3.1* 3.1*     Liver Function Tests: Recent Labs  Lab 12/31/21 1044 01/01/22 0235 01/02/22 0230 01/03/22 0242 01/04/22 0303  AST 129* 88* 70* 73* 75*  ALT 85* 74* 69* 80* 90*  ALKPHOS 44 46 51 52 53  BILITOT 0.6 1.0 1.0 0.8 0.6  PROT 6.7 6.6 7.2 6.7 7.0  ALBUMIN 3.5 3.6 3.8 3.5 3.5    Recent Labs  Lab 12/31/21 0033 01/01/22 0235 01/02/22 0230  LIPASE 116* 84* 57*    No results for input(s): AMMONIA in the last 168 hours.  ABG    Component Value Date/Time   HCO3 27.1 02/12/2021 0056   TCO2 23 04/29/2021 2105   O2SAT 100.0 02/12/2021 0056      Coagulation Profile: Recent Labs  Lab 12/31/21 1044  INR 1.0     Cardiac Enzymes: Recent Labs  Lab 12/31/21 1045  CKTOTAL 221     HbA1C: Hgb A1c MFr Bld  Date/Time Value Ref Range Status  07/26/2021 06:53 AM 4.3 (L) 4.8 - 5.6 % Final    Comment:    (NOTE) Pre diabetes:          5.7%-6.4%  Diabetes:               >6.4%  Glycemic control for   <7.0% adults with diabetes   03/12/2021 03:57 AM 5.1 4.8 - 5.6 % Final    Comment:    (NOTE) Pre diabetes:          5.7%-6.4%  Diabetes:              >6.4%  Glycemic control for   <7.0% adults with diabetes     CBG: No results for input(s): GLUCAP in the last 168 hours.    Kara Mead MD. [place] [facility].  Pulmonary & Critical care  Pager : [phone]  If no response to pager , please call [phone] until 7 pm After 7:00 pm call Elink  [phone]    01/06/2022, 9:08 AM

## 2022-01-06 NOTE — Discharge Summary (Signed)
AMA  Patient at this time expresses desire to leave the Hospital immidiately, patient has been warned that this is not Medically advisable at this time, and can result in Medical complications like Death and Disability, patient understands and accepts the risks involved and assumes full responsibilty of this decision. I made my best effort to convince him to stay. Eventually decided to leave AGAINST MEDICAL ADVICE. Earlier he was taken of IVC per psychiatry as he had no SI homicidal ideation and was alert awake oriented x3, able to make medical decision.  Lanae Boast M.D on 01/06/2022 at 2:55 PM  [facility]  Time < 30 minutes  Last Note Below    PROGRESS NOTE Gary [name]  ZOX:[id number] DOB: [birth year] DOA: 12/31/2021 PCP: Pcp, No   Brief Narrative/Hospital Course: 36 y.o. male with medical history significant of alcohol abuse, tobacco abuse, polysubstance abuse, HIV disease not on treatment, history of subdural hematoma, history of seizures, treatment noncompliance p/w suicidal ideation and alcohol withdrawal with visual/auditory hallucinations,was seeing clowns. Reports he drinks at least 2/5 of liquor daily. C/o abdominal pain associated with nausea, but no emesis, diarrhea, constipation, melena or hematochezia. In ed: Initial vital signs stable afebrile, started on lorazepam per CIWA protocol, labs- CBC leukopenia, hemoconcentration, thrombocytopenia- wbc 3.6, hb 14.4 g/dL and platelets 97.  Troponin x2 was normal.  ethyl alcohol was 482 mg/dL.  lipase  84, ast/alt 182/102, UDS positive for benzo.MRI brain findings that may represent age advanced chronic white matter microangiopathy, migraines, prior infection/inflammation, among other etiologies  Patient admitted for further management psychiatry was consulted. 2/23:Patient was placed under  IVC given that he was having alcohol withdrawal and wanted to [facility], remains with one-to-one sitter. 2/24 CIWA was very high up to 33 and placed on Precedex drip-several days eventually weaned off 2/27 pm.At this time patient is clinically improving,     Subjective: Seen and examined this morning, resting.  One-to-one at bedside. He was asking for higher dose of Ativan, scale was at 24 this morning and received Ativan. No complaints tolerating p.o. Under IVC since 2/23, off Precedex drip since 2/24 and off on 2/27 pm Psychiatry has seen and rescinding IVC today. Nursing report patient has been ambulating in the room to the bathroom with calm demeanor not agitated.  Assessment and Plan: * Alcohol abuse with withdrawal (HCC)- (present on admission) Alcohol intoxication-last EtOH 2/20 2 AM Alcohol withdrawal delirium/hallucination recurrent admission for alcohol withdrawal delirium and usually needs Precedex: 2/24 CIWA was very high up to 33 and placed on Precedex drip-PCCM managing-weaned off Precedex 2/20 7 PM, also on Ativan standing and CIWA. On thiamine/folate.  Needed 20 mg of Ativan past 24 hours, phenobarbital added today weaning of Ativan.  Overall much improved psychiatry evaluated, patient is alert awake oriented x3, able to make medical decisions and psychiatry rescinding IVC.I will allow for at least additional 1 day of treatment-wean off ativan.  Patient has been ambulating in the room to the bathroom without any issues.  Alcohol intoxication with delirium (HCC) See above  Neutropenia (HCC)- (present on admission) leukopenia Thrombocytopenia: Likely secondary to chronic alcoholism.  Overall stable.  Monitor. Recent Labs  Lab 01/03/22 [phone]  01/04/22 0303 01/06/22 0245  HGB 12.8* 13.5 15.2  HCT 38.7* 39.5 45.0  WBC 4.2 3.1* 3.1*  PLT 82* 107* 188    Thrombocytopenia (HCC)- (present on admission) See above  Transaminitis- (present on admission) Due to his alcohol abuse,  AST/ALT mildly elevated but stable.  Trend.  Macrocytosis- (present on admission) From alcohol abuse.monitor.  Suicidal ideation suicidal ideation on admission seen by psychiatry and ON one-to-one suicide precaution. on IVC since 01/01/22.  Currently denies suicidal ideation homicidal ideation alert awake interactive appropriate.  Psychiatry rescinding IVC today.    Cigarette smoker- (present on admission) Cessation counseling  HIV disease (HCC)- (present on admission) Currently not on medical therapy.Last CD4 215 CD8 44 Seen by PCP/Whitney 10/2021- per note " She spoke to ID and was advised to reestablish HART therapy by following with ID as OP- and ID did not recommend continuing Hart therapy if patient has not been compliant" he IS instructed to will follow-up with his infectious disease physician upon discharge   DVT prophylaxis: enoxaparin (LOVENOX) injection 40 mg Start: 01/05/22 1200 SCDs Start: 12/31/21 0732.  No chemical prophylaxis due to thrombocytopenia Code Status:   Code Status: Full Code Family Communication: plan of care discussed with patient.  Disposition: Currently not medically stable for discharge. Status is: Inpatient in the stepdown The patient will remain hospitalized for ongoing management.Anticipate discharge tomorrow.  Objective: Vitals last 24 hrs: Vitals:   01/06/22 1000 01/06/22 1100 01/06/22 1156 01/06/22 1200  BP:    122/90  Pulse: 89 85  91  Resp: 20 19  19   Temp:   98.2 F (36.8 C)   TempSrc:   Oral   SpO2: 98% 97%  98%  Weight:      Height:       Weight change:   Physical Examination: General exam: AA0X3,older than stated age, weak appearing. HEENT:Oral mucosa moist, Ear/Nose WNL grossly, dentition normal. Respiratory system: bilaterally diminished, no use of accessory muscle Cardiovascular system: S1 & S2 +, No JVD,. Gastrointestinal system: Abdomen soft,NT,ND,BS+ Nervous System:Alert, awake, moving extremities and grossly  nonfocal Extremities: LE ankle edema none, distal peripheral pulses palpable.  Skin: No rashes,no icterus. MSK: Normal muscle bulk,tone, power   Medications reviewed:  Scheduled Meds:  Chlorhexidine Gluconate Cloth  6 each Topical Q2000   enoxaparin (LOVENOX) injection  40 mg Subcutaneous Daily   folic acid  1 mg Oral Daily   LORazepam  2 mg Oral TID   melatonin  3 mg Oral QHS   multivitamin with minerals  1 tablet Oral Daily   PHENObarbital  65 mg Intravenous TID   thiamine  100 mg Oral Daily   Or   thiamine  100 mg Intravenous Daily   vitamin B-12  1,000 mcg Oral Daily   Continuous Infusions:      Diet Order             Diet regular Room service appropriate? Yes; Fluid consistency: Thin  Diet effective now                   Intake/Output Summary (Last 24 hours) at 01/06/2022 1456 Last data filed at 01/06/2022 1120 Gross per 24 hour  Intake 1652.25 ml  Output 300 ml  Net 1352.25 ml   Net IO Since Admission: 5,653.31 mL [01/06/22 1456]  Wt Readings from Last 3 Encounters:  12/31/21 68.5 kg  11/08/21 74.4 kg  09/30/21 70.3 kg     Unresulted Labs (From admission, onward)     Start  Ordered   01/06/22 0500  CBC  Daily,   R     Question:  Specimen collection method  Answer:  Lab=Lab collect   01/05/22 1130   01/06/22 0500  Basic metabolic panel  Daily,   R     Question:  Specimen collection method  Answer:  Lab=Lab collect   01/05/22 1130           Data Reviewed: I have personally reviewed following labs and imaging studies CBC: Recent Labs  Lab 12/31/21 0033 12/31/21 1044 01/01/22 0235 01/02/22 0230 01/03/22 0242 01/04/22 0303 01/06/22 0245  WBC 3.6*   < > 2.7* 2.5* 4.2 3.1* 3.1*  NEUTROABS 1.5*  --   --   --   --   --   --   HGB 14.4   < > 13.1 13.8 12.8* 13.5 15.2  HCT 41.8   < > 38.3* 41.2 38.7* 39.5 45.0  MCV 101.5*   < > 103.0* 103.5* 104.9* 103.4* 102.7*  PLT 97*   < > 70* 72* 82* 107* 188   < > = values in this interval not  displayed.   Basic Metabolic Panel: Recent Labs  Lab 12/31/21 1044 01/01/22 0235 01/02/22 0230 01/03/22 0242 01/04/22 0303 01/06/22 0245  NA 138 133* 135 134* 133* 135  K 3.6 3.5 3.9 4.0 3.9 4.1  CL 104 102 105 105 105 103  CO2 25 25 22 24  21* 24  GLUCOSE 95 96 115* 113* 103* 100*  BUN 9 6 6 12 15 10   CREATININE 0.54* 0.44* 0.55* 0.63 0.61 0.69  CALCIUM 8.1* 9.0 9.3 8.8* 9.1 9.3  MG 1.8  --   --   --  2.0  --   PHOS 4.5  --   --   --   --   --    GFR: Estimated Creatinine Clearance: 124.9 mL/min (by C-G formula based on SCr of 0.69 mg/dL). Liver Function Tests: Recent Labs  Lab 12/31/21 1044 01/01/22 0235 01/02/22 0230 01/03/22 0242 01/04/22 0303  AST 129* 88* 70* 73* 75*  ALT 85* 74* 69* 80* 90*  ALKPHOS 44 46 51 52 53  BILITOT 0.6 1.0 1.0 0.8 0.6  PROT 6.7 6.6 7.2 6.7 7.0  ALBUMIN 3.5 3.6 3.8 3.5 3.5   Recent Labs  Lab 12/31/21 0033 01/01/22 0235 01/02/22 0230  LIPASE 116* 84* 57*   No results for input(s): AMMONIA in the last 168 hours. Coagulation Profile: Recent Labs  Lab 12/31/21 1044  INR 1.0   Cardiac Enzymes: Recent Labs  Lab 12/31/21 1045  CKTOTAL 221   BNP (last 3 results) No results for input(s): PROBNP in the last 8760 hours. HbA1C: No results for input(s): HGBA1C in the last 72 hours. CBG: No results for input(s): GLUCAP in the last 168 hours. Lipid Profile: No results for input(s): CHOL, HDL, LDLCALC, TRIG, CHOLHDL, LDLDIRECT in the last 72 hours. Thyroid Function Tests: No results for input(s): TSH, T4TOTAL, FREET4, T3FREE, THYROIDAB in the last 72 hours.  Anemia Panel: No results for input(s): VITAMINB12, FOLATE, FERRITIN, TIBC, IRON, RETICCTPCT in the last 72 hours.  Sepsis Labs: No results for input(s): PROCALCITON, LATICACIDVEN in the last 168 hours.  Recent Results (from the past 240 hour(s))  Resp Panel by RT-PCR (Flu A&B, Covid) Nasopharyngeal Swab     Status: None   Collection Time: 12/31/21  8:06 AM   Specimen:  Nasopharyngeal Swab; Nasopharyngeal(NP) swabs in vial transport medium  Result Value Ref Range Status   SARS Coronavirus  2 by RT PCR NEGATIVE NEGATIVE Final    Comment: (NOTE) SARS-CoV-2 target nucleic acids are NOT DETECTED.  The SARS-CoV-2 RNA is generally detectable in upper respiratory specimens during the acute phase of infection. The lowest concentration of SARS-CoV-2 viral copies this assay can detect is 138 copies/mL. A negative result does not preclude SARS-Cov-2 infection and should not be used as the sole basis for treatment or other patient management decisions. A negative result may occur with  improper specimen collection/handling, submission of specimen other than nasopharyngeal swab, presence of viral mutation(s) within the areas targeted by this assay, and inadequate number of viral copies(<138 copies/mL). A negative result must be combined with clinical observations, patient history, and epidemiological information. The expected result is Negative.  Fact Sheet for Patients:  BloggerCourse.com  Fact Sheet for Healthcare Providers:  SeriousBroker.it  This test is no t yet approved or cleared by the [place] FDA and  has been authorized for detection and/or diagnosis of SARS-CoV-2 by FDA under an Emergency Use Authorization (EUA). This EUA will remain  in effect (meaning this test can be used) for the duration of the COVID-19 declaration under Section 564(b)(1) of the Act, 21 U.S.C.section 360bbb-3(b)(1), unless the authorization is terminated  or revoked sooner.       Influenza A by PCR NEGATIVE NEGATIVE Final   Influenza B by PCR NEGATIVE NEGATIVE Final    Comment: (NOTE) The Xpert Xpress SARS-CoV-2/FLU/RSV plus assay is intended as an aid in the diagnosis of influenza from Nasopharyngeal swab specimens and should not be used as a sole basis for treatment. Nasal washings and aspirates are unacceptable for  Xpert Xpress SARS-CoV-2/FLU/RSV testing.  Fact Sheet for Patients: BloggerCourse.com  Fact Sheet for Healthcare Providers: SeriousBroker.it  This test is not yet approved or cleared by the [place] FDA and has been authorized for detection and/or diagnosis of SARS-CoV-2 by FDA under an Emergency Use Authorization (EUA). This EUA will remain in effect (meaning this test can be used) for the duration of the COVID-19 declaration under Section 564(b)(1) of the Act, 21 U.S.C. section 360bbb-3(b)(1), unless the authorization is terminated or revoked.  Performed at [facility], [address], [place], [place] [address]   MRSA Next Gen by PCR, Nasal     Status: None   Collection Time: 12/31/21  9:46 AM   Specimen: Nasal Mucosa; Nasal Swab  Result Value Ref Range Status   MRSA by PCR Next Gen NOT DETECTED NOT DETECTED Final    Comment: (NOTE) The GeneXpert MRSA Assay (FDA approved for NASAL specimens only), is one component of a comprehensive MRSA colonization surveillance program. It is not intended to diagnose MRSA infection nor to guide or monitor treatment for MRSA infections. Test performance is not FDA approved in patients less than 61 years old. Performed at [facility], [address], [place], [place] [address]      Antimicrobials: Anti-infectives (From admission, onward)    None      Culture/Microbiology Radiology Studies: No results found.   LOS: 5 days   Lanae Boast, MD [facility]  01/06/2022, 2:56 PM

## 2022-01-06 NOTE — TOC Transition Note (Signed)
Transition of Care [facility]) - CM/SW Discharge Note   Patient Details  Name: Gary [name] MRN: [id number] Date of Birth: 01/02/1986  Transition of Care [facility] CM/SW Contact:  Golda Acre, RN Phone Number: 01/06/2022, 2:50 PM   Clinical Narrative:    Ivc has been rescended.  Paperwork in the chart and to the clerk of court via Tom in the ed.  Patient is leaving ama.     Barriers to Discharge: Continued Medical Work up   Patient Goals and CMS Choice Patient states their goals for this hospitalization and ongoing recovery are:: not stated CMS Medicare.gov Compare Post Acute Care list provided to:: Patient    Discharge Placement                       Discharge Plan and Services   Discharge Planning Services: CM Consult                                 Social Determinants of Health (SDOH) Interventions     Readmission Risk Interventions No flowsheet data found.

## 2022-01-06 NOTE — Progress Notes (Signed)
IVC paperwork rescinded, pt made aware.  Pt left against medical advice.  Ulis Rias, RN 01/06/2022 3:56 PM

## 2022-01-06 NOTE — Progress Notes (Signed)
PROGRESS NOTE IDUS STOESSEL  ZOX:[id number] DOB: October 02, 1986 DOA: 12/31/2021 PCP: Pcp, No   Brief Narrative/Hospital Course: 36 y.o. male with medical history significant of alcohol abuse, tobacco abuse, polysubstance abuse, HIV disease not on treatment, history of subdural hematoma, history of seizures, treatment noncompliance p/w suicidal ideation and alcohol withdrawal with visual/auditory hallucinations,was seeing clowns. Reports he drinks at least 2/5 of liquor daily. C/o abdominal pain associated with nausea, but no emesis, diarrhea, constipation, melena or hematochezia. In ed: Initial vital signs stable afebrile, started on lorazepam per CIWA protocol, labs- CBC leukopenia, hemoconcentration, thrombocytopenia- wbc 3.6, hb 14.4 g/dL and platelets 97.  Troponin x2 was normal.  ethyl alcohol was 482 mg/dL.  lipase  84, ast/alt 182/102, UDS positive for benzo.MRI brain findings that may represent age advanced chronic white matter microangiopathy, migraines, prior infection/inflammation, among other etiologies  Patient admitted for further management psychiatry was consulted. 2/23:Patient was placed under IVC given that he was having alcohol withdrawal and wanted to [facility], remains with one-to-one sitter. 2/24 CIWA was very high up to 33 and placed on Precedex drip-several days eventually weaned off 2/27 pm.At this time patient is clinically improving,     Subjective: Seen and examined this morning, resting.  One-to-one at bedside. He was asking for higher dose of Ativan, scale was at 24 this morning and received Ativan. No complaints tolerating p.o. Under IVC since 2/23, off Precedex drip since 2/24 and off on 2/27 pm Psychiatry has seen and rescinding IVC today. Nursing report patient has been ambulating in the room to the bathroom with calm demeanor not agitated.  Assessment and Plan: * Alcohol abuse with withdrawal (HCC)- (present on admission) Alcohol intoxication-last EtOH 2/20 2 AM Alcohol  withdrawal delirium/hallucination recurrent admission for alcohol withdrawal delirium and usually needs Precedex: 2/24 CIWA was very high up to 33 and placed on Precedex drip-PCCM managing-weaned off Precedex 2/20 7 PM, also on Ativan standing and CIWA. On thiamine/folate.  Needed 20 mg of Ativan past 24 hours, phenobarbital added today weaning of Ativan.  Overall much improved psychiatry evaluated, patient is alert awake oriented x3, able to make medical decisions and psychiatry rescinding IVC.I will allow for at least additional 1 day of treatment-wean off ativan.  Patient has been ambulating in the room to the bathroom without any issues.  Alcohol intoxication with delirium (HCC) See above  Neutropenia (HCC)- (present on admission) leukopenia Thrombocytopenia: Likely secondary to chronic alcoholism.  Overall stable.  Monitor. Recent Labs  Lab 01/03/22 0242 01/04/22 0303 01/06/22 0245  HGB 12.8* 13.5 15.2  HCT 38.7* 39.5 45.0  WBC 4.2 3.1* 3.1*  PLT 82* 107* 188    Thrombocytopenia (HCC)- (present on admission) See above  Transaminitis- (present on admission) Due to his alcohol abuse, AST/ALT mildly elevated but stable.  Trend.  Macrocytosis- (present on admission) From alcohol abuse.monitor.  Suicidal ideation suicidal ideation on admission seen by psychiatry and ON one-to-one suicide precaution. on IVC since 01/01/22.  Currently denies suicidal ideation homicidal ideation alert awake interactive appropriate.  Psychiatry rescinding IVC today.    Cigarette smoker- (present on admission) Cessation counseling  HIV disease (HCC)- (present on admission) Currently not on medical therapy.Last CD4 215 CD8 44 Seen by PCP/Whitney 10/2021- per note " She spoke to ID and was advised to reestablish HART therapy by following with ID as OP- and ID did not recommend continuing Hart therapy if patient has not been compliant" he IS instructed to will follow-up with his infectious disease  physician upon discharge  DVT prophylaxis: enoxaparin (LOVENOX) injection 40 mg Start: 01/05/22 1200 SCDs Start: 12/31/21 0732.  No chemical prophylaxis due to thrombocytopenia Code Status:   Code Status: Full Code Family Communication: plan of care discussed with patient.  Disposition: Currently not medically stable for discharge. Status is: Inpatient in the stepdown The patient will remain hospitalized for ongoing management.Anticipate discharge tomorrow.  Objective: Vitals last 24 hrs: Vitals:   01/06/22 1000 01/06/22 1100 01/06/22 1156 01/06/22 1200  BP:    122/90  Pulse: 89 85  91  Resp: 20 19  19   Temp:   98.2 F (36.8 C)   TempSrc:   Oral   SpO2: 98% 97%  98%  Weight:      Height:       Weight change:   Physical Examination: General exam: AA0X3,older than stated age, weak appearing. HEENT:Oral mucosa moist, Ear/Nose WNL grossly, dentition normal. Respiratory system: bilaterally diminished, no use of accessory muscle Cardiovascular system: S1 & S2 +, No JVD,. Gastrointestinal system: Abdomen soft,NT,ND,BS+ Nervous System:Alert, awake, moving extremities and grossly nonfocal Extremities: LE ankle edema none, distal peripheral pulses palpable.  Skin: No rashes,no icterus. MSK: Normal muscle bulk,tone, power   Medications reviewed:  Scheduled Meds:  Chlorhexidine Gluconate Cloth  6 each Topical Q2000   enoxaparin (LOVENOX) injection  40 mg Subcutaneous Daily   folic acid  1 mg Oral Daily   LORazepam  2 mg Oral TID   melatonin  3 mg Oral QHS   multivitamin with minerals  1 tablet Oral Daily   PHENObarbital  65 mg Intravenous TID   thiamine  100 mg Oral Daily   Or   thiamine  100 mg Intravenous Daily   vitamin B-12  1,000 mcg Oral Daily   Continuous Infusions:      Diet Order             Diet regular Room service appropriate? Yes; Fluid consistency: Thin  Diet effective now                   Intake/Output Summary (Last 24 hours) at 01/06/2022  1430 Last data filed at 01/06/2022 1120 Gross per 24 hour  Intake 1652.25 ml  Output 300 ml  Net 1352.25 ml   Net IO Since Admission: 5,653.31 mL [01/06/22 1430]  Wt Readings from Last 3 Encounters:  12/31/21 68.5 kg  11/08/21 74.4 kg  09/30/21 70.3 kg     Unresulted Labs (From admission, onward)     Start     Ordered   01/06/22 0500  CBC  Daily,   R     Question:  Specimen collection method  Answer:  Lab=Lab collect   01/05/22 1130   01/06/22 0500  Basic metabolic panel  Daily,   R     Question:  Specimen collection method  Answer:  Lab=Lab collect   01/05/22 1130           Data Reviewed: I have personally reviewed following labs and imaging studies CBC: Recent Labs  Lab 12/31/21 0033 12/31/21 1044 01/01/22 0235 01/02/22 0230 01/03/22 0242 01/04/22 0303 01/06/22 0245  WBC 3.6*   < > 2.7* 2.5* 4.2 3.1* 3.1*  NEUTROABS 1.5*  --   --   --   --   --   --   HGB 14.4   < > 13.1 13.8 12.8* 13.5 15.2  HCT 41.8   < > 38.3* 41.2 38.7* 39.5 45.0  MCV 101.5*   < > 103.0* 103.5* 104.9* 103.4* 102.7*  PLT 97*   < > 70* 72* 82* 107* 188   < > = values in this interval not displayed.   Basic Metabolic Panel: Recent Labs  Lab 12/31/21 1044 01/01/22 0235 01/02/22 0230 01/03/22 0242 01/04/22 0303 01/06/22 0245  NA 138 133* 135 134* 133* 135  K 3.6 3.5 3.9 4.0 3.9 4.1  CL 104 102 105 105 105 103  CO2 25 25 22 24  21* 24  GLUCOSE 95 96 115* 113* 103* 100*  BUN 9 6 6 12 15 10   CREATININE 0.54* 0.44* 0.55* 0.63 0.61 0.69  CALCIUM 8.1* 9.0 9.3 8.8* 9.1 9.3  MG 1.8  --   --   --  2.0  --   PHOS 4.5  --   --   --   --   --    GFR: Estimated Creatinine Clearance: 124.9 mL/min (by C-G formula based on SCr of 0.69 mg/dL). Liver Function Tests: Recent Labs  Lab 12/31/21 1044 01/01/22 0235 01/02/22 0230 01/03/22 0242 01/04/22 0303  AST 129* 88* 70* 73* 75*  ALT 85* 74* 69* 80* 90*  ALKPHOS 44 46 51 52 53  BILITOT 0.6 1.0 1.0 0.8 0.6  PROT 6.7 6.6 7.2 6.7 7.0   ALBUMIN 3.5 3.6 3.8 3.5 3.5   Recent Labs  Lab 12/31/21 0033 01/01/22 0235 01/02/22 0230  LIPASE 116* 84* 57*   No results for input(s): AMMONIA in the last 168 hours. Coagulation Profile: Recent Labs  Lab 12/31/21 1044  INR 1.0   Cardiac Enzymes: Recent Labs  Lab 12/31/21 1045  CKTOTAL 221   BNP (last 3 results) No results for input(s): PROBNP in the last 8760 hours. HbA1C: No results for input(s): HGBA1C in the last 72 hours. CBG: No results for input(s): GLUCAP in the last 168 hours. Lipid Profile: No results for input(s): CHOL, HDL, LDLCALC, TRIG, CHOLHDL, LDLDIRECT in the last 72 hours. Thyroid Function Tests: No results for input(s): TSH, T4TOTAL, FREET4, T3FREE, THYROIDAB in the last 72 hours.  Anemia Panel: No results for input(s): VITAMINB12, FOLATE, FERRITIN, TIBC, IRON, RETICCTPCT in the last 72 hours.  Sepsis Labs: No results for input(s): PROCALCITON, LATICACIDVEN in the last 168 hours.  Recent Results (from the past 240 hour(s))  Resp Panel by RT-PCR (Flu A&B, Covid) Nasopharyngeal Swab     Status: None   Collection Time: 12/31/21  8:06 AM   Specimen: Nasopharyngeal Swab; Nasopharyngeal(NP) swabs in vial transport medium  Result Value Ref Range Status   SARS Coronavirus 2 by RT PCR NEGATIVE NEGATIVE Final    Comment: (NOTE) SARS-CoV-2 target nucleic acids are NOT DETECTED.  The SARS-CoV-2 RNA is generally detectable in upper respiratory specimens during the acute phase of infection. The lowest concentration of SARS-CoV-2 viral copies this assay can detect is 138 copies/mL. A negative result does not preclude SARS-Cov-2 infection and should not be used as the sole basis for treatment or other patient management decisions. A negative result may occur with  improper specimen collection/handling, submission of specimen other than nasopharyngeal swab, presence of viral mutation(s) within the areas targeted by this assay, and inadequate number of  viral copies(<138 copies/mL). A negative result must be combined with clinical observations, patient history, and epidemiological information. The expected result is Negative.  Fact Sheet for Patients:  BloggerCourse.com  Fact Sheet for Healthcare Providers:  SeriousBroker.it  This test is no t yet approved or cleared by the [place] FDA and  has been authorized for detection and/or diagnosis of SARS-CoV-2 by FDA  under an Emergency Use Authorization (EUA). This EUA will remain  in effect (meaning this test can be used) for the duration of the COVID-19 declaration under Section 564(b)(1) of the Act, 21 U.S.C.section 360bbb-3(b)(1), unless the authorization is terminated  or revoked sooner.       Influenza A by PCR NEGATIVE NEGATIVE Final   Influenza B by PCR NEGATIVE NEGATIVE Final    Comment: (NOTE) The Xpert Xpress SARS-CoV-2/FLU/RSV plus assay is intended as an aid in the diagnosis of influenza from Nasopharyngeal swab specimens and should not be used as a sole basis for treatment. Nasal washings and aspirates are unacceptable for Xpert Xpress SARS-CoV-2/FLU/RSV testing.  Fact Sheet for Patients: BloggerCourse.com  Fact Sheet for Healthcare Providers: SeriousBroker.it  This test is not yet approved or cleared by the [place] FDA and has been authorized for detection and/or diagnosis of SARS-CoV-2 by FDA under an Emergency Use Authorization (EUA). This EUA will remain in effect (meaning this test can be used) for the duration of the COVID-19 declaration under Section 564(b)(1) of the Act, 21 U.S.C. section 360bbb-3(b)(1), unless the authorization is terminated or revoked.  Performed at [facility], [address], [place], [place] [address]   MRSA Next Gen by PCR, Nasal     Status: None   Collection Time: 12/31/21  9:46 AM   Specimen: Nasal  Mucosa; Nasal Swab  Result Value Ref Range Status   MRSA by PCR Next Gen NOT DETECTED NOT DETECTED Final    Comment: (NOTE) The GeneXpert MRSA Assay (FDA approved for NASAL specimens only), is one component of a comprehensive MRSA colonization surveillance program. It is not intended to diagnose MRSA infection nor to guide or monitor treatment for MRSA infections. Test performance is not FDA approved in patients less than 58 years old. Performed at [facility], [address], [place], [place] [address]      Antimicrobials: Anti-infectives (From admission, onward)    None      Culture/Microbiology Radiology Studies: No results found.   LOS: 5 days   Lanae Boast, MD [facility]  01/06/2022, 2:30 PM

## 2022-01-06 NOTE — Consult Note (Signed)
°  Patient's reassessment on today's date.  Patient has previously stated he has not entered into the alcohol detox any further, but has no interest in/dual diagnosis hospitalization.  This nurse practitioner revisited patient to inquire about his ongoing interest, in which he continues to decline.  Patient states he will feel much better once he leaves.  He also declines any interest in naltrexone and or any other medication use for alcohol detox.  He is encouraged to remain in the hospital for 1 additional day to continue appropriate medical detox, in which he declines.  He understands he is under involuntary commitment, however feels he is still being held against his will for substance abuse treatment which he does not want.  Patient has continued to deny suicidal ideations throughout his hospitalization (5 days) and has no recent attempts.  Patient has not displayed any overt aggression, agitation, or combativeness for hospitalization.  He denies any suicidal ideations, self-harm behaviors, or suicidal thoughts.    I explored effects of substances on his mental health with him. Patient is in denial about the negative effects. Says it helps him cope. No motivation to stay sober. Risks especially on effects of substances with respect to suicide was discussed with him. Effects of substances seems to have worn off. His mood has dramatically improved although irritable. Patient is no longer expressing any suicidal thoughts. He is engaging more. Patient has limited insight on the role of substances in his mental health. He does not seem motivated to engage in any relapse preventive measure at this time.    -Patient does present with mental capacity to make medical decisions, although he does lack insight in some areas.  He does not express interest in any alcohol or substance abuse rehabilitation/detox programs.  He does not meet criteria for involuntary commitment at this time.  Will rescind IVC.   Thank you  for psychiatric consult.  Psychiatry 1 outside hospital at this time.  The above Information has been communicated and relayed to the nursing and medical team.  -Notice of commitment change for involuntary commitment will be faxed over to magistrate.

## 2022-01-19 ENCOUNTER — Encounter (HOSPITAL_COMMUNITY): Payer: Self-pay | Admitting: Emergency Medicine

## 2022-01-19 ENCOUNTER — Inpatient Hospital Stay (HOSPITAL_COMMUNITY)
Admission: EM | Admit: 2022-01-19 | Discharge: 2022-01-27 | DRG: 897 | Disposition: A | Discharge status: Home / Self Care | Payer: Self-pay | Attending: Family Medicine | Admitting: Family Medicine

## 2022-01-19 ENCOUNTER — Other Ambulatory Visit: Payer: Self-pay

## 2022-01-19 DIAGNOSIS — R45851 Suicidal ideations: Secondary | ICD-10-CM | POA: Diagnosis present

## 2022-01-19 DIAGNOSIS — F10932 Alcohol use, unspecified with withdrawal with perceptual disturbance: Secondary | ICD-10-CM | POA: Diagnosis present

## 2022-01-19 DIAGNOSIS — F10232 Alcohol dependence with withdrawal with perceptual disturbance: Principal | ICD-10-CM | POA: Diagnosis present

## 2022-01-19 DIAGNOSIS — B20 Human immunodeficiency virus [HIV] disease: Secondary | ICD-10-CM | POA: Diagnosis present

## 2022-01-19 DIAGNOSIS — F1994 Other psychoactive substance use, unspecified with psychoactive substance-induced mood disorder: Secondary | ICD-10-CM | POA: Diagnosis present

## 2022-01-19 DIAGNOSIS — D7589 Other specified diseases of blood and blood-forming organs: Secondary | ICD-10-CM | POA: Diagnosis present

## 2022-01-19 DIAGNOSIS — R569 Unspecified convulsions: Secondary | ICD-10-CM

## 2022-01-19 DIAGNOSIS — K7010 Alcoholic hepatitis without ascites: Secondary | ICD-10-CM | POA: Diagnosis present

## 2022-01-19 DIAGNOSIS — Z811 Family history of alcohol abuse and dependence: Secondary | ICD-10-CM

## 2022-01-19 DIAGNOSIS — Z5900 Homelessness unspecified: Secondary | ICD-10-CM

## 2022-01-19 DIAGNOSIS — G629 Polyneuropathy, unspecified: Secondary | ICD-10-CM | POA: Diagnosis present

## 2022-01-19 DIAGNOSIS — F1910 Other psychoactive substance abuse, uncomplicated: Secondary | ICD-10-CM | POA: Diagnosis present

## 2022-01-19 DIAGNOSIS — Z046 Encounter for general psychiatric examination, requested by authority: Secondary | ICD-10-CM

## 2022-01-19 DIAGNOSIS — G40909 Epilepsy, unspecified, not intractable, without status epilepticus: Secondary | ICD-10-CM | POA: Diagnosis present

## 2022-01-19 DIAGNOSIS — F411 Generalized anxiety disorder: Secondary | ICD-10-CM | POA: Diagnosis present

## 2022-01-19 DIAGNOSIS — Z20822 Contact with and (suspected) exposure to covid-19: Secondary | ICD-10-CM | POA: Diagnosis present

## 2022-01-19 DIAGNOSIS — E876 Hypokalemia: Secondary | ICD-10-CM

## 2022-01-19 DIAGNOSIS — F10939 Alcohol use, unspecified with withdrawal, unspecified: Principal | ICD-10-CM

## 2022-01-19 DIAGNOSIS — D6959 Other secondary thrombocytopenia: Secondary | ICD-10-CM | POA: Diagnosis present

## 2022-01-19 DIAGNOSIS — F1024 Alcohol dependence with alcohol-induced mood disorder: Secondary | ICD-10-CM | POA: Diagnosis present

## 2022-01-19 DIAGNOSIS — Y908 Blood alcohol level of 240 mg/100 ml or more: Secondary | ICD-10-CM | POA: Diagnosis present

## 2022-01-19 DIAGNOSIS — Z560 Unemployment, unspecified: Secondary | ICD-10-CM

## 2022-01-19 DIAGNOSIS — R079 Chest pain, unspecified: Secondary | ICD-10-CM

## 2022-01-19 DIAGNOSIS — R7401 Elevation of levels of liver transaminase levels: Secondary | ICD-10-CM | POA: Diagnosis present

## 2022-01-19 DIAGNOSIS — F1020 Alcohol dependence, uncomplicated: Secondary | ICD-10-CM | POA: Diagnosis present

## 2022-01-19 DIAGNOSIS — G4700 Insomnia, unspecified: Secondary | ICD-10-CM | POA: Diagnosis present

## 2022-01-19 DIAGNOSIS — Z21 Asymptomatic human immunodeficiency virus [HIV] infection status: Secondary | ICD-10-CM | POA: Diagnosis present

## 2022-01-19 DIAGNOSIS — F17210 Nicotine dependence, cigarettes, uncomplicated: Secondary | ICD-10-CM | POA: Diagnosis present

## 2022-01-19 LAB — CBC WITH DIFFERENTIAL/PLATELET
Abs Immature Granulocytes: 0.02 10*3/uL (ref 0.00–0.07)
Basophils Absolute: 0.1 10*3/uL (ref 0.0–0.1)
Basophils Relative: 1 %
Eosinophils Absolute: 0.1 10*3/uL (ref 0.0–0.5)
Eosinophils Relative: 2 %
HCT: 42.7 % (ref 39.0–52.0)
Hemoglobin: 14.4 g/dL (ref 13.0–17.0)
Immature Granulocytes: 0 %
Lymphocytes Relative: 23 %
Lymphs Abs: 1.4 10*3/uL (ref 0.7–4.0)
MCH: 34.1 pg — ABNORMAL HIGH (ref 26.0–34.0)
MCHC: 33.7 g/dL (ref 30.0–36.0)
MCV: 101.2 fL — ABNORMAL HIGH (ref 80.0–100.0)
Monocytes Absolute: 0.3 10*3/uL (ref 0.1–1.0)
Monocytes Relative: 4 %
Neutro Abs: 4.2 10*3/uL (ref 1.7–7.7)
Neutrophils Relative %: 70 %
Platelets: 261 10*3/uL (ref 150–400)
RBC: 4.22 MIL/uL (ref 4.22–5.81)
RDW: 14 % (ref 11.5–15.5)
WBC: 6.1 10*3/uL (ref 4.0–10.5)
nRBC: 0 % (ref 0.0–0.2)

## 2022-01-19 NOTE — ED Notes (Signed)
While dressing out, patient stated "you seem nice so I'm not gonna try to hurt or kill you or nothing" ?

## 2022-01-19 NOTE — ED Triage Notes (Signed)
Patient reports alcoholism with auditory hallucinations, last drink today , patient added homicidal ideation but did not specify person's identity . No SI .  ?

## 2022-01-19 NOTE — ED Provider Triage Note (Signed)
Emergency Medicine Provider Triage Evaluation Note ? ?Gary [name] , a 36 y.o. male  was evaluated in triage.  Pt complains of "not feeling good".  He states that his friends think that he is having a mental breakdown.  He endorses hearing voices at all times that he does not know if they are real or not.  He states that the voices tell him to hurt people.  He states that he is having homicidal thoughts of hurting a violent man that has been after him.  He states he wants to break both of that man's legs.  He denies any suicidal thoughts.  He does endorse drinking several beers earlier today, but states that he is concerned about possible withdrawal as he does have a history of seizures secondary to withdrawal.  Denies any chest pain or shortness of breath.  He was admitted for similar complaints on February 22 and left AGAINST MEDICAL ADVICE on February 28. ? ?Review of Systems  ?Positive: As above ?Negative: As above ? ?Physical Exam  ?There were no vitals taken for this visit. ?Gen:   Awake, no distress   ?Resp:  Normal effort  ?MSK:   Moves extremities without difficulty  ?Other:  Calm, cooperative, no evidence of response to external stimuli.  No significant tremor noted. ? ?Medical Decision Making  ?Medically screening exam initiated at 11:24 PM.  Appropriate orders placed.  Gary [name] was informed that the remainder of the evaluation will be completed by another provider, this initial triage assessment does not replace that evaluation, and the importance of remaining in the ED until their evaluation is complete. ? ?Patient currently here voluntarily however will require IVC if he attempts to leave secondary to homicidal ideation. ?  ?Mare Ferrari, PA-C ?01/19/22 2327 ? ?

## 2022-01-20 ENCOUNTER — Encounter (HOSPITAL_COMMUNITY): Payer: Self-pay | Admitting: Family Medicine

## 2022-01-20 ENCOUNTER — Emergency Department (HOSPITAL_COMMUNITY): Payer: Self-pay

## 2022-01-20 DIAGNOSIS — D7589 Other specified diseases of blood and blood-forming organs: Secondary | ICD-10-CM

## 2022-01-20 DIAGNOSIS — R7401 Elevation of levels of liver transaminase levels: Secondary | ICD-10-CM

## 2022-01-20 DIAGNOSIS — F10932 Alcohol use, unspecified with withdrawal with perceptual disturbance: Secondary | ICD-10-CM

## 2022-01-20 DIAGNOSIS — R45851 Suicidal ideations: Secondary | ICD-10-CM

## 2022-01-20 DIAGNOSIS — B20 Human immunodeficiency virus [HIV] disease: Secondary | ICD-10-CM

## 2022-01-20 DIAGNOSIS — R079 Chest pain, unspecified: Secondary | ICD-10-CM

## 2022-01-20 DIAGNOSIS — Z046 Encounter for general psychiatric examination, requested by authority: Secondary | ICD-10-CM

## 2022-01-20 DIAGNOSIS — F1910 Other psychoactive substance abuse, uncomplicated: Secondary | ICD-10-CM

## 2022-01-20 DIAGNOSIS — F1994 Other psychoactive substance use, unspecified with psychoactive substance-induced mood disorder: Secondary | ICD-10-CM

## 2022-01-20 DIAGNOSIS — F1020 Alcohol dependence, uncomplicated: Secondary | ICD-10-CM

## 2022-01-20 DIAGNOSIS — F17210 Nicotine dependence, cigarettes, uncomplicated: Secondary | ICD-10-CM

## 2022-01-20 LAB — COMPREHENSIVE METABOLIC PANEL
ALT: 79 U/L — ABNORMAL HIGH (ref 0–44)
AST: 77 U/L — ABNORMAL HIGH (ref 15–41)
Albumin: 3.9 g/dL (ref 3.5–5.0)
Alkaline Phosphatase: 61 U/L (ref 38–126)
Anion gap: 14 (ref 5–15)
BUN: 6 mg/dL (ref 6–20)
CO2: 23 mmol/L (ref 22–32)
Calcium: 9.2 mg/dL (ref 8.9–10.3)
Chloride: 103 mmol/L (ref 98–111)
Creatinine, Ser: 0.75 mg/dL (ref 0.61–1.24)
GFR, Estimated: >60 mL/min (ref >60)
Glucose, Bld: 109 mg/dL — ABNORMAL HIGH (ref 70–99)
Potassium: 3.8 mmol/L (ref 3.5–5.1)
Sodium: 140 mmol/L (ref 135–145)
Total Bilirubin: 0.4 mg/dL (ref 0.3–1.2)
Total Protein: 7.2 g/dL (ref 6.5–8.1)

## 2022-01-20 LAB — PHOSPHORUS: Phosphorus: 4.5 mg/dL (ref 2.5–4.6)

## 2022-01-20 LAB — RAPID URINE DRUG SCREEN, HOSP PERFORMED
Amphetamines: NOT DETECTED
Barbiturates: NOT DETECTED
Benzodiazepines: NOT DETECTED
Cocaine: NOT DETECTED
Opiates: NOT DETECTED
Tetrahydrocannabinol: NOT DETECTED

## 2022-01-20 LAB — ETHANOL: Alcohol, Ethyl (B): 323 mg/dL (ref <10)

## 2022-01-20 LAB — RESP PANEL BY RT-PCR (FLU A&B, COVID) ARPGX2
Influenza A by PCR: NEGATIVE
Influenza B by PCR: NEGATIVE
SARS Coronavirus 2 by RT PCR: NEGATIVE

## 2022-01-20 LAB — TROPONIN I (HIGH SENSITIVITY)
Troponin I (High Sensitivity): 4 ng/L (ref <18)
Troponin I (High Sensitivity): 7 ng/L (ref <18)

## 2022-01-20 LAB — SALICYLATE LEVEL: Salicylate Lvl: <7 mg/dL — ABNORMAL LOW (ref 7.0–30.0)

## 2022-01-20 LAB — ACETAMINOPHEN LEVEL: Acetaminophen (Tylenol), Serum: <10 ug/mL — ABNORMAL LOW (ref 10–30)

## 2022-01-20 LAB — MAGNESIUM: Magnesium: 1.8 mg/dL (ref 1.7–2.4)

## 2022-01-20 IMAGING — DX DG CHEST 1V PORT
1 series · 1 of 1 positions shown · non-contrast
Comparison: [DATE]

CLINICAL DATA: Chest pain.  Alcoholism.  Hallucinations.

EXAM:
PORTABLE CHEST 1 VIEW

[chest ap]
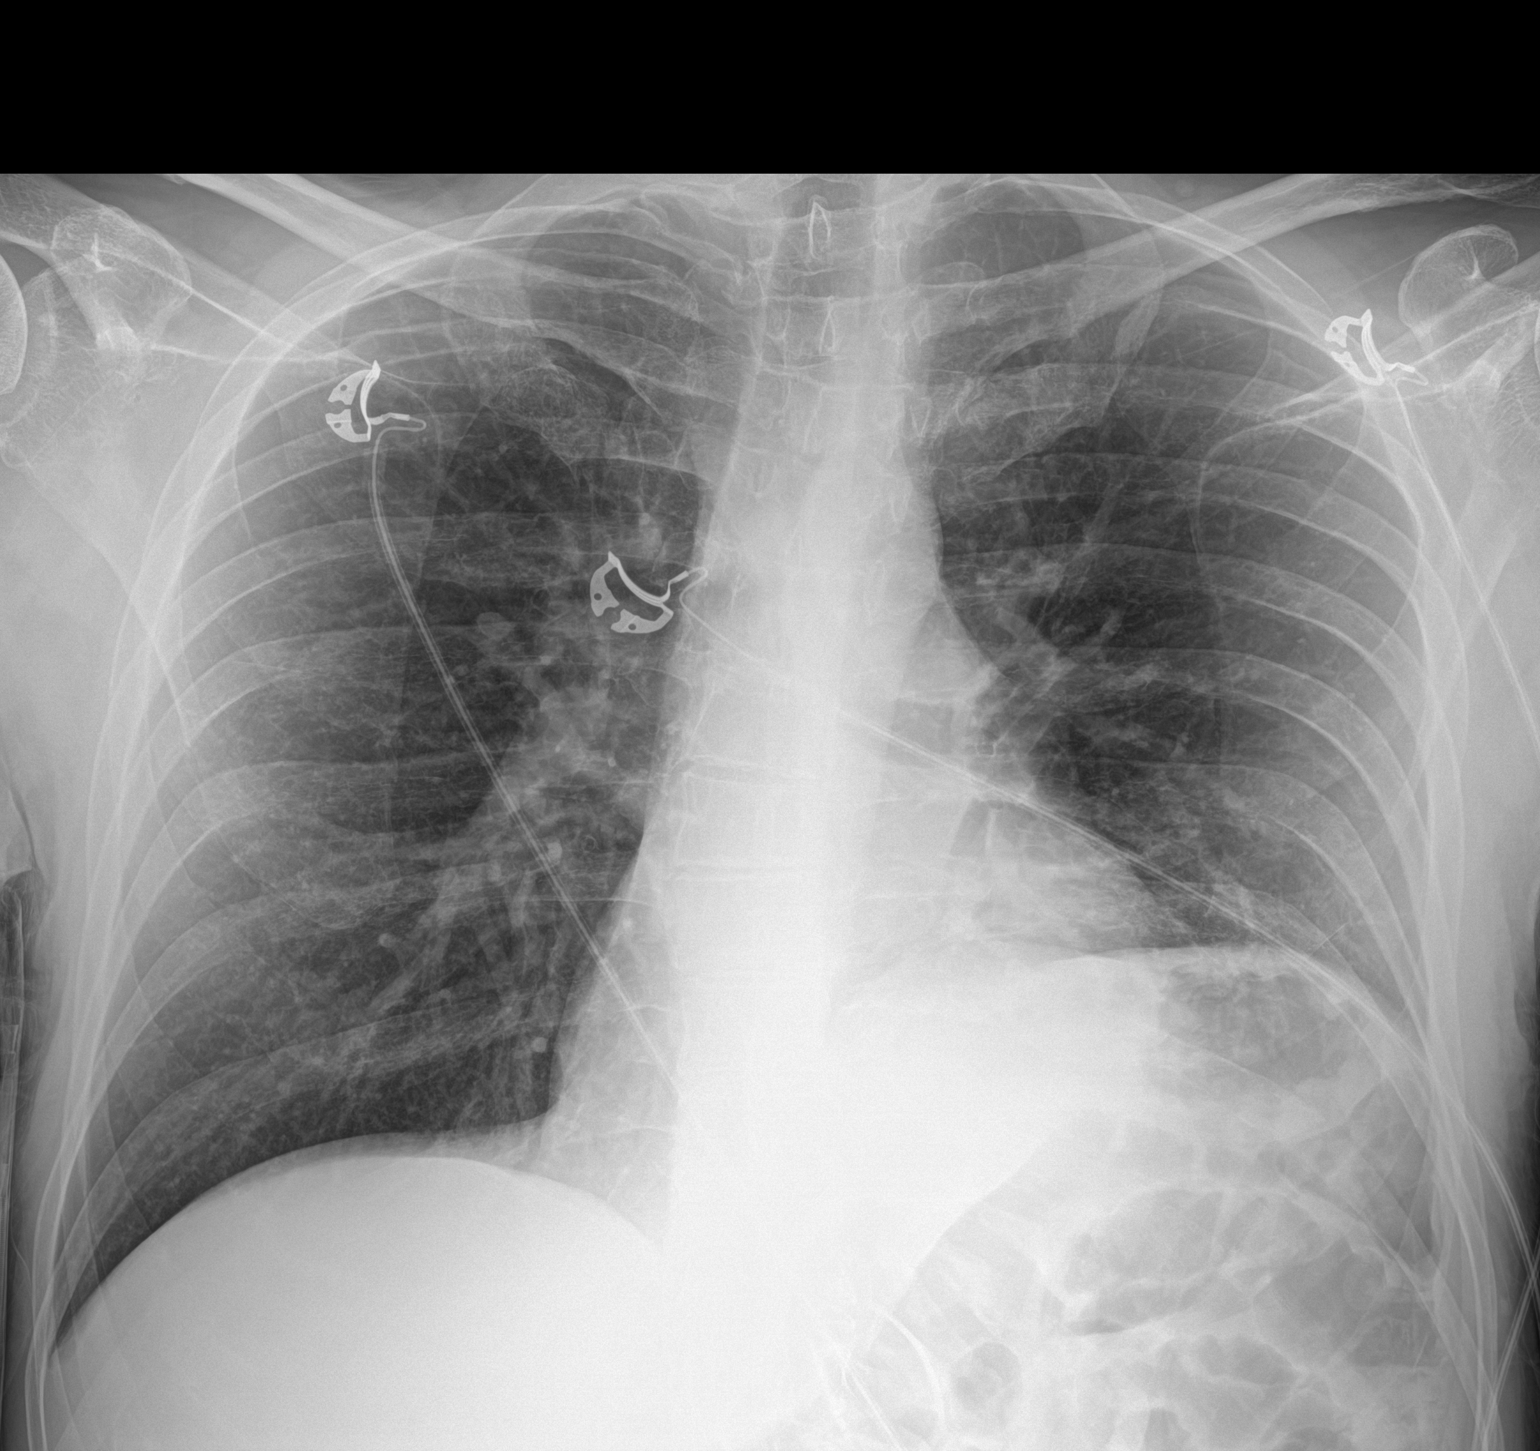

[1 of 1 positions shown; findings below may reference images not displayed]

FINDINGS: Chronic asymmetric elevation of left hemidiaphragm is again noted.
Stable cardiomediastinal contours. No pleural effusion or edema
identified. No airspace densities.
IMPRESSION: No acute cardiopulmonary abnormalities.

## 2022-01-20 MED ORDER — ACETAMINOPHEN 650 MG RE SUPP: 650.0000 mg | Freq: Four times a day (QID) | RECTAL | Status: DC | PRN

## 2022-01-20 MED ORDER — LORAZEPAM 2 MG/ML IJ SOLN
1.0000 mg | INTRAMUSCULAR | Status: AC | PRN
  Administered 2022-01-20: 1 mg via INTRAVENOUS
  Administered 2022-01-20 – 2022-01-21 (×4): 2 mg via INTRAVENOUS

## 2022-01-20 MED ORDER — ALBUTEROL SULFATE (2.5 MG/3ML) 0.083% IN NEBU: 2.5000 mg | INHALATION_SOLUTION | Freq: Four times a day (QID) | RESPIRATORY_TRACT | Status: DC | PRN

## 2022-01-20 MED ORDER — THIAMINE HCL 100 MG/ML IJ SOLN: 500.0000 mg | Freq: Every day | INTRAMUSCULAR | Status: DC

## 2022-01-20 MED ORDER — SODIUM CHLORIDE 0.9% FLUSH
3.0000 mL | Freq: Two times a day (BID) | INTRAVENOUS | Status: DC
  Administered 2022-01-20 – 2022-01-27 (×14): 3 mL via INTRAVENOUS

## 2022-01-20 MED ORDER — THIAMINE HCL 100 MG/ML IJ SOLN: 100.0000 mg | Freq: Every day | INTRAMUSCULAR | Status: DC

## 2022-01-20 MED ORDER — ALUM & MAG HYDROXIDE-SIMETH 200-200-20 MG/5ML PO SUSP
30.0000 mL | Freq: Four times a day (QID) | ORAL | Status: DC | PRN
Start: 2022-01-20 — End: 2022-01-27

## 2022-01-20 MED ORDER — THIAMINE HCL 100 MG PO TABS
100.0000 mg | ORAL_TABLET | Freq: Every day | ORAL | Status: DC
  Administered 2022-01-20 – 2022-01-21 (×2): 100 mg via ORAL

## 2022-01-20 MED ORDER — LIDOCAINE VISCOUS HCL 2 % MT SOLN
15.0000 mL | Freq: Four times a day (QID) | OROMUCOSAL | Status: DC | PRN
  Filled 2022-01-20: qty 15

## 2022-01-20 MED ORDER — PANTOPRAZOLE SODIUM 40 MG IV SOLR
40.0000 mg | Freq: Once | INTRAVENOUS | Status: AC
  Administered 2022-01-20: 40 mg via INTRAVENOUS

## 2022-01-20 MED ORDER — PANTOPRAZOLE SODIUM 40 MG PO TBEC
40.0000 mg | DELAYED_RELEASE_TABLET | Freq: Every day | ORAL | Status: DC
  Administered 2022-01-21 – 2022-01-27 (×7): 40 mg via ORAL

## 2022-01-20 MED ORDER — FOLIC ACID 1 MG PO TABS
1.0000 mg | ORAL_TABLET | Freq: Every day | ORAL | Status: DC
  Administered 2022-01-20 – 2022-01-27 (×8): 1 mg via ORAL

## 2022-01-20 MED ORDER — ONDANSETRON HCL 4 MG PO TABS: 4.0000 mg | ORAL_TABLET | Freq: Four times a day (QID) | ORAL | Status: DC | PRN

## 2022-01-20 MED ORDER — THIAMINE HCL 100 MG PO TABS: 100.0000 mg | ORAL_TABLET | Freq: Every day | ORAL | Status: DC

## 2022-01-20 MED ORDER — LORAZEPAM 2 MG/ML IJ SOLN: 0.0000 mg | Freq: Two times a day (BID) | INTRAMUSCULAR | Status: AC

## 2022-01-20 MED ORDER — LORAZEPAM 1 MG PO TABS
0.0000 mg | ORAL_TABLET | Freq: Two times a day (BID) | ORAL | Status: AC
  Administered 2022-01-22: 3 mg via ORAL
  Administered 2022-01-22 – 2022-01-23 (×2): 2 mg via ORAL

## 2022-01-20 MED ORDER — ADULT MULTIVITAMIN W/MINERALS CH
1.0000 | ORAL_TABLET | Freq: Every day | ORAL | Status: DC
  Administered 2022-01-20 – 2022-01-27 (×8): 1 via ORAL

## 2022-01-20 MED ORDER — HYDRALAZINE HCL 20 MG/ML IJ SOLN
10.0000 mg | INTRAMUSCULAR | Status: DC | PRN
Start: 2022-01-20 — End: 2022-01-27

## 2022-01-20 MED ORDER — ENOXAPARIN SODIUM 40 MG/0.4ML IJ SOSY
40.0000 mg | PREFILLED_SYRINGE | INTRAMUSCULAR | Status: DC
  Administered 2022-01-20 – 2022-01-27 (×5): 40 mg via SUBCUTANEOUS

## 2022-01-20 MED ORDER — GABAPENTIN 300 MG PO CAPS
300.0000 mg | ORAL_CAPSULE | Freq: Three times a day (TID) | ORAL | Status: DC
  Administered 2022-01-20 – 2022-01-27 (×21): 300 mg via ORAL

## 2022-01-20 MED ORDER — THIAMINE HCL 100 MG PO TABS: 500.0000 mg | ORAL_TABLET | Freq: Every day | ORAL | Status: DC

## 2022-01-20 MED ORDER — ACETAMINOPHEN 325 MG PO TABS
650.0000 mg | ORAL_TABLET | Freq: Four times a day (QID) | ORAL | Status: DC | PRN
  Administered 2022-01-20 – 2022-01-25 (×2): 650 mg via ORAL

## 2022-01-20 MED ORDER — ONDANSETRON HCL 4 MG/2ML IJ SOLN: 4.0000 mg | Freq: Four times a day (QID) | INTRAMUSCULAR | Status: DC | PRN

## 2022-01-20 MED ORDER — LORAZEPAM 1 MG PO TABS
1.0000 mg | ORAL_TABLET | ORAL | Status: AC | PRN
  Administered 2022-01-21 – 2022-01-22 (×2): 3 mg via ORAL
  Administered 2022-01-22: 2 mg via ORAL
  Administered 2022-01-22: 1 mg via ORAL

## 2022-01-20 MED ORDER — LORAZEPAM 1 MG PO TABS
0.0000 mg | ORAL_TABLET | Freq: Four times a day (QID) | ORAL | Status: AC
  Administered 2022-01-20: 1 mg via ORAL
  Administered 2022-01-21: 4 mg via ORAL
  Administered 2022-01-21 (×2): 2 mg via ORAL
  Administered 2022-01-21: 1 mg via ORAL

## 2022-01-20 MED ORDER — ALUM & MAG HYDROXIDE-SIMETH 200-200-20 MG/5ML PO SUSP
30.0000 mL | Freq: Once | ORAL | Status: AC
  Administered 2022-01-20: 30 mL via ORAL

## 2022-01-20 MED ORDER — SODIUM CHLORIDE 0.9 % IV BOLUS
1000.0000 mL | Freq: Once | INTRAVENOUS | Status: AC
  Administered 2022-01-20: 1000 mL via INTRAVENOUS

## 2022-01-20 MED ORDER — LORAZEPAM 2 MG/ML IJ SOLN
0.0000 mg | Freq: Four times a day (QID) | INTRAMUSCULAR | Status: AC
  Administered 2022-01-20: 4 mg via INTRAVENOUS
  Administered 2022-01-20: 2 mg via INTRAVENOUS

## 2022-01-20 NOTE — Assessment & Plan Note (Addendum)
Patient had initially expressed his thoughts of wanting to drink himself to death.  Continue one-to-one sitter.  Psychiatry has seen the patient today.  Been started on trazodone. ? ?

## 2022-01-20 NOTE — Plan of Care (Signed)
  Problem: Education: Goal: Knowledge of General Education information will improve Description Including pain rating scale, medication(s)/side effects and non-pharmacologic comfort measures Outcome: Progressing   Problem: Health Behavior/Discharge Planning: Goal: Ability to manage health-related needs will improve Outcome: Progressing   

## 2022-01-20 NOTE — Assessment & Plan Note (Addendum)
Current smoker.  Counseling was done ?

## 2022-01-20 NOTE — Assessment & Plan Note (Addendum)
Last CD4 count was 215 on 11/07/2021.  Patient reports that he has been off Biktarvy for at least 2 weeks.  Restarted on Biktarvy.  We will need to reestablish care at the outpatient setting with ID ?

## 2022-01-20 NOTE — Assessment & Plan Note (Addendum)
Chronic.  Thought to secondary to alcohol usage ?

## 2022-01-20 NOTE — ED Notes (Signed)
Breakfast order placed ?

## 2022-01-20 NOTE — H&P (Addendum)
History and Physical    Patient: ABDULAZEEZ [name] XLK:[id number] DOB: 06/12/1986 DOA: 01/19/2022 DOS: the patient was seen and examined on 01/20/2022 PCP: Pcp, No  Patient coming from: brought in by police  Chief Complaint:  Chief Complaint  Patient presents with   Alcoholism/HI/Hallucinations   HPI: Gary [name] is a 36 y.o. male with medical history significant of HIV not on treatment, polysubstance abuse(including but not limited to alcohol, tobacco, methamphetamines, marijuana), SDH, seizures, alcohol withdrawal with auditory/visual hallucinations, and suicidal ideations presents with complaints of having a mental breakdown.  He complains of hearing whispers, but cannot usually make out what they are saying.  He also reports seeing spots and having intermittent blurry vision.  He was just recently hospitalized from 2/22-2/28 with alcohol withdrawals requiring placement on a Precedex drip in the ICU, but ultimately left AGAINST MEDICAL ADVICE.  Since leaving the hospital patient reports that he has been drinking 2 fifths of liquor per day, and his last drink was yesterday at around noon time.  At this time he complains of having nausea, substernal/left-sided chest pain, shortness of breath, and neuropathy of his hands/feet.  He continues to smoke cigarettes, but denies any illicit drug use this year.  After leaving hospital patient reports that he does not have money to get prescriptions.  Patient was noted to be afebrile with heart rate 82-105, respiration 12-22, and all other vital signs maintained.  Labs significant for elevated MCV/MCH, AST 77, ALT 79, alcohol level 323, and high-sensitivity troponin 4.  Acetaminophen and salicylate levels were undetectable.  UDS was negative.  Influenza and COVID-19 screening were negative.  Chest x-ray noted no acute abnormalities.  Patient had been given 1 L of normal saline IV fluids and started on CIWA protocols.  Initial CIWA was 24.  Patient was  involuntarily committed due to his reports of suicidal ideations.   Review of Systems: As mentioned in the history of present illness. All other systems reviewed and are negative. Past Medical History:  Diagnosis Date   Alcohol abuse    HIV (human immunodeficiency virus infection) (HCC)    Seizures (HCC)    Subdural hematoma    Past Surgical History:  Procedure Laterality Date   INCISION AND DRAINAGE PERIRECTAL ABSCESS N/A 09/24/2016   Procedure: IRRIGATION AND DEBRIDEMENT PERIRECTAL ABSCESS;  Surgeon: Ricarda Frame, MD;  Location: [facility] ORS;  Service: General;  Laterality: N/A;   none     Social History:  reports that he has been smoking cigarettes. He has been smoking an average of .5 packs per day. He has never used smokeless tobacco. He reports current alcohol use. He reports current drug use. Drugs: Methamphetamines and Marijuana.  Allergies  Allergen Reactions   Tegretol [Carbamazepine] Other (See Comments)    Caused vertigo for 2 days after taking it   Caffeine Palpitations    Family History  Problem Relation Age of Onset   Alcohol abuse Mother    Alcohol abuse Father     Prior to Admission medications   Medication Sig Start Date End Date Taking? Authorizing Provider  bictegravir-emtricitabine-tenofovir AF (BIKTARVY) 50-200-25 MG TABS tablet Take 1 tablet by mouth daily. Patient not taking: Reported on 01/20/2022 08/31/21   Ardis Hughs, NP  famotidine (PEPCID) 20 MG tablet Take 1 tablet (20 mg total) by mouth 2 (two) times daily. Patient not taking: Reported on 06/01/2019 05/14/19 06/02/19  Benjiman Core, MD    Physical Exam: Vitals:   01/20/22 0700 01/20/22 0715 01/20/22 0730 01/20/22  0745  BP: 126/85 127/88 127/87 129/85  Pulse: 86 84 93 88  Resp: 15 (!) 21 19 17   Temp:      TempSrc:      SpO2: 98% 100% 100% 97%    Constitutional: Disheveled elevated middle-age male who is tremulous, but able to follow commands Eyes: PERRL, lids and conjunctivae  normal ENMT: Mucous membranes are dry.  Posterior pharynx clear of any exudate or lesions.  Neck: normal, supple  Respiratory: Normal respiratory effort without significant wheezing appreciated.  O2 saturation maintained on room air. Cardiovascular: Regular rate and rhythm, no murmurs / rubs / gallops. No extremity edema. 2+ pedal pulses.    Abdomen: no tenderness, no masses palpated.  Bowel sounds positive.  Musculoskeletal: no clubbing / cyanosis. No joint deformity upper and lower extremities.  Gait: Unsteady gait while walking from bathroom Skin: Healed wound of the right lateral ankle Neurologic: CN 2-12 grossly intact.  Tremor present   Psychiatric: . Alert and oriented x 3.  Reports suicidal ideations with plans to drink himself to death  Data Reviewed:  EKG noted normal sinus rhythm at 88 bpm.  Vitals, labs, imaging, and pertinent records were reviewed as noted above in the HPI.  Assessment and Plan: * Involuntary commitment secondary to suicidal ideation Due to patient's reports thoughts of wanting to drink himself to death.  Brings up the fact that a couple of his other friends had passed away due to overdoses.  Involuntary commitment paperwork completed by ED provider. -Sitter one-to-one -Will need to consult psych once patient medically stable for possible discharge or if additional behavioral health support needed   Alcohol withdrawal hallucinosis (HCC) alcohol use disorder, severe, dependence alcohol-induced mood disorder Presents with complaints of auditory and visual hallucinations.  Patient reports drinking 2 fifths of liquor per day on average.  His last alcohol level on admission 323 and patient reported last drink yesterday.  Initial CIWA was 24 and recent repeat 18.  Patient was IVC'd by the ED provider.  Patient has history of alcohol withdrawal complications including seizure with prior SDH in addition to hallucinations and suicidal/homicidal ideations.  -Admit to a  progressive bed -CIWA protocol with scheduled and as needed Ativan -Thiamine, folate, and MVI -Transitions of care consulted -Continue to monitor and consult PCCM if CIWA begins to rise as patient has frequently required Precedex drip plus/minus phenobarbital  Chest pain Acute.  Patient did report complaints of chest pain.  Initial high-sensitivity troponins negative and EKG similar to previous tracing without significant signs of ischemia.  Question if patient's symptoms are related with reflux. -Follow-up repeat cardiac troponin -Protonix IV x1 dose and then changed to p.o. daily -GI cocktail as needed  Transaminitis Chronic.  AST 77 and ALT 79.  Secondary to patient's history of alcohol abuse.  Macrocytosis Chronic.  Thought secondary to alcohol use.  Cigarette smoker Patient does admit to still smoking cigarettes. -Continue counseling on need of cessation of tobacco use.  Polysubstance abuse [facility]) Patient denies any recent illicit drug use since the beginning of this year.  Her screening was negative on admission. -Continue to encourage cessation of illicit drugs  HIV disease (HCC) Last CD4 count 215 on 11/07/2021.  Patient reports that he has been off Biktarvy for at least 2 weeks.  During previous hospitalization from 12/22 it had been advised patient to reestablish HART therapy with ID in outpatient setting. -Patient needs to be set up for a follow-up appointment with ID in the outpatient setting  Neuropathy  Acute on chronic.  Patient complains of severe pain of the hands and feet.  Thought possibly secondary to patient's alcohol abuse. -Gabapentin as he reports previously given during prior hospitalizations, but reported lack of money to continue on an outpatient setting.  GI prophylaxis: Protonix  DVT prophylaxis: Lovenox  Advance Care Planning:   Code Status: Full Code   Consults: None  Family Communication: None  Severity of Illness: The appropriate patient  status for this patient is INPATIENT. Inpatient status is judged to be reasonable and necessary in order to provide the required intensity of service to ensure the patient's safety. The patient's presenting symptoms, physical exam findings, and initial radiographic and laboratory data in the context of their chronic comorbidities is felt to place them at high risk for further clinical deterioration. Furthermore, it is not anticipated that the patient will be medically stable for discharge from the hospital within 2 midnights of admission.   * I certify that at the point of admission it is my clinical judgment that the patient will require inpatient hospital care spanning beyond 2 midnights from the point of admission due to high intensity of service, high risk for further deterioration and high frequency of surveillance required.*  Author: Clydie Braun, MD 01/20/2022 8:31 AM  For on call review [email or web address].

## 2022-01-20 NOTE — ED Notes (Signed)
PT belongings in locker 12 ?

## 2022-01-20 NOTE — Assessment & Plan Note (Addendum)
EKG/ troponin negative.  Continue Protonix.  GI cocktail.  No further chest pain today. ?

## 2022-01-20 NOTE — Assessment & Plan Note (Addendum)
Urine drug screen this time was negative except for benzos.  We will continue to monitor closely.  Patient was intoxicated on arrival.  Continue CIWA protocol. ?

## 2022-01-20 NOTE — Assessment & Plan Note (Addendum)
History of needing Precedex/phenobarbital for severe alcohol withdrawal.  We will continue to monitor closely.  Currently on involuntary commitment from the ED due to suicidal ideation.  Continue CIWA protocol.  Might need Precedex if unimproved.  Continue IV thiamine, p.o. folate, and MVI. Transitions of care has been consulted.  Complains of mild visual hallucination.  ?

## 2022-01-20 NOTE — ED Notes (Signed)
Patient is fully IVC'd - all documents faxed to [facility], copy sets given to RN ?

## 2022-01-20 NOTE — Assessment & Plan Note (Addendum)
Chronic.  AST 77 and ALT 79 on presentation..  Secondary to patient's history of alcohol abuse.  Check LFTs in AM. ?

## 2022-01-20 NOTE — ED Provider Notes (Signed)
?[place] ?Provider Note ? ? ?CSN: [id number] ?Arrival date & time: 01/19/22  2250 ? ?  ? ?History ? ?Chief Complaint  ?Patient presents with  ? Alcoholism/HI/Hallucinations  ? ? ?Gary [name] is a 36 y.o. male. ? ?HPI ? ?Patient with medical history including HIV, seizures, alcohol dependency, polysubstance dependency presents with chief complaint of withdrawals.  Patient states that he last drink alcohol at 12 PM yesterday, states that since then he is been going through withdrawals.  He states that his whole body is shaking and is having  substernal chest pain.  Patient states chest pain remains in the middle of his chest does not rating to his back, states it is constant, describes as a pressure-like sensation, no shortness of breath, did not becoming diaphoretic, lightheaded dizziness nausea or vomiting, states he has had this in the past typically after he goes withdrawals.  Patient states that he typically drinks 7 bottles of wine daily, 2 fifths daily.  He is also endorsing suicidal ideations, states states that he wants to drink himself to death, he denies any illicit drug use, has other complaints at this time.  Alcohol ? ?I reviewed patient's chart patient's been admitted multiple times for alcoholic withdrawals, most recent was February where he was started on Precedex as well as Ativan but unfortunately left AMA. ? ?Home Medications ?Prior to Admission medications   ?Medication Sig Start Date End Date Taking? Authorizing Provider  ?bictegravir-emtricitabine-tenofovir AF (BIKTARVY) 50-200-25 MG TABS tablet Take 1 tablet by mouth daily. ?Patient not taking: Reported on 09/22/2021 08/31/21   Revonda Humphrey, NP  ?chlordiazePOXIDE (LIBRIUM) 25 MG capsule 50mg  PO TID x 1D, then 25-50mg  PO BID X 1D, then 25-50mg  PO QD X 1D ?Patient not taking: Reported on 09/22/2021 09/06/21   Sherrill Raring, PA-C  ?famotidine (PEPCID) 20 MG tablet Take 1 tablet (20 mg total) by  mouth 2 (two) times daily. ?Patient not taking: Reported on 06/01/2019 05/14/19 06/02/19  Davonna Belling, MD  ?   ? ?Allergies    ?Tegretol [carbamazepine] and Caffeine   ? ?Review of Systems   ?Review of Systems  ?Constitutional:  Negative for chills and fever.  ?Respiratory:  Negative for shortness of breath.   ?Cardiovascular:  Positive for chest pain.  ?Gastrointestinal:  Negative for abdominal pain.  ?Neurological:  Negative for headaches.  ?Psychiatric/Behavioral:  Positive for suicidal ideas.   ? ?Physical Exam ?Updated Vital Signs ?BP 127/88   Pulse 92   Temp 98.3 ?F (36.8 ?C) (Oral)   Resp 17   SpO2 97%  ?Physical Exam ?Vitals and nursing note reviewed.  ?Constitutional:   ?   General: He is not in acute distress. ?   Appearance: He is ill-appearing.  ?   Comments: Disheveled, deconditioned state, tremulous  ?HENT:  ?   Head: Normocephalic and atraumatic.  ?   Nose: No congestion.  ?   Mouth/Throat:  ?   Mouth: Mucous membranes are dry.  ?   Pharynx: Oropharynx is clear.  ?Eyes:  ?   Extraocular Movements: Extraocular movements intact.  ?   Conjunctiva/sclera: Conjunctivae normal.  ?   Pupils: Pupils are equal, round, and reactive to light.  ?Cardiovascular:  ?   Rate and Rhythm: Regular rhythm. Tachycardia present.  ?   Pulses: Normal pulses.  ?   Heart sounds: No murmur heard. ?  No friction rub. No gallop.  ?Pulmonary:  ?   Effort: No respiratory distress.  ?  Breath sounds: No wheezing, rhonchi or rales.  ?Abdominal:  ?   Palpations: Abdomen is soft.  ?   Tenderness: There is abdominal tenderness. There is no right CVA tenderness or left CVA tenderness.  ?   Comments: Abdomen nondistended, normoactive bowel sounds, dull to percussion, had noted epigastric tenderness, there is no guarding, rebound tenderness, peritoneal sign negative Murphy sign McBurney point.  ?Musculoskeletal:  ?   Right lower leg: No edema.  ?   Left lower leg: No edema.  ?Skin: ?   General: Skin is warm and dry.  ?Neurological:   ?   Mental Status: He is alert.  ?   Comments: No facial asymmetry, no difficult word finding, no unilateral weakness present.  ?Psychiatric:     ?   Mood and Affect: Mood normal.  ?   Comments: Alert and oriented x4, he is responding appropriately, not responding to internal stimuli, endorses suicidal ideations plan is to drink himself to death.  ? ? ?ED Results / Procedures / Treatments   ?Labs ?(all labs ordered are listed, but only abnormal results are displayed) ?Labs Reviewed  ?COMPREHENSIVE METABOLIC PANEL - Abnormal; Notable for the following components:  ?    Result Value  ? Glucose, Bld 109 (*)   ? AST 77 (*)   ? ALT 79 (*)   ? All other components within normal limits  ?ETHANOL - Abnormal; Notable for the following components:  ? Alcohol, Ethyl (B) 323 (*)   ? All other components within normal limits  ?CBC WITH DIFFERENTIAL/PLATELET - Abnormal; Notable for the following components:  ? MCV 101.2 (*)   ? MCH 34.1 (*)   ? All other components within normal limits  ?ACETAMINOPHEN LEVEL - Abnormal; Notable for the following components:  ? Acetaminophen (Tylenol), Serum <10 (*)   ? All other components within normal limits  ?SALICYLATE LEVEL - Abnormal; Notable for the following components:  ? Salicylate Lvl [id number] (*)   ? All other components within normal limits  ?RESP PANEL BY RT-PCR (FLU A&B, COVID) [id number]  ?RAPID URINE DRUG SCREEN, HOSP PERFORMED  ?TROPONIN I (HIGH SENSITIVITY)  ?TROPONIN I (HIGH SENSITIVITY)  ? ? ?EKG ?EKG Interpretation ? ?Date/Time:  Tuesday January 20 2022 04:56:44 EDT ?Ventricular Rate:  88 ?PR Interval:  136 ?QRS Duration: 96 ?QT Interval:  378 ?QTC Calculation: 457 ?R Axis:   51 ?Text Interpretation: Normal sinus rhythm Minimal voltage criteria for LVH, may be normal variant ( Sokolow-Lyon ) Septal infarct , age undetermined Abnormal ECG When compared with ECG of 31-Dec-2021 00:54, No significant changes noted Confirmed by Veryl Speak [phone]) on 01/20/2022 5:04:24 AM ? ?Radiology ?DG  Chest Portable 1 View ? ?Result Date: 01/20/2022 ?CLINICAL DATA:  Chest pain.  Alcoholism.  Hallucinations. EXAM: PORTABLE CHEST 1 VIEW COMPARISON:  12/31/2021 FINDINGS: Chronic asymmetric elevation of left hemidiaphragm is again noted. Stable cardiomediastinal contours. No pleural effusion or edema identified. No airspace densities. IMPRESSION: No acute cardiopulmonary abnormalities. Electronically Signed   By: Kerby Moors M.D.   On: 01/20/2022 06:05   ? ?Procedures ?Marland KitchenCritical Care ?Performed by: Marcello Fennel, PA-C ?Authorized by: Marcello Fennel, PA-C  ? ?Critical care provider statement:  ?  Critical care time (minutes):  60 ?  Critical care time was exclusive of:  Separately billable procedures and treating other patients ?  Critical care was time spent personally by me on the following activities:  Discussions with consultants, evaluation of patient's response to treatment, examination of patient, ordering and  review of laboratory studies, ordering and review of radiographic studies, ordering and performing treatments and interventions, pulse oximetry, re-evaluation of patient's condition and review of old charts ?  I assumed direction of critical care for this patient from another provider in my specialty: no   ?  Care discussed with: admitting provider    ? ? ?Medications Ordered in ED ?Medications  ?LORazepam (ATIVAN) injection 0-4 mg (4 mg Intravenous Given 01/20/22 0518)  ?  Or  ?LORazepam (ATIVAN) tablet 0-4 mg ( Oral See Alternative 01/20/22 0518)  ?LORazepam (ATIVAN) injection 0-4 mg (has no administration in time range)  ?  Or  ?LORazepam (ATIVAN) tablet 0-4 mg (has no administration in time range)  ?thiamine tablet 100 mg (has no administration in time range)  ?  Or  ?thiamine (B-1) injection 100 mg (has no administration in time range)  ?sodium chloride 0.9 % bolus 1,000 mL (1,000 mLs Intravenous New Bag/Given 01/20/22 0518)  ? ? ?ED Course/ Medical Decision Making/ A&P ?  ?                         ?Medical Decision Making ?Amount and/or Complexity of Data Reviewed ?Radiology: ordered. ? ?Risk ?OTC drugs. ?Prescription drug management. ? ? ?This patient presents to the ED for concern of withdra

## 2022-01-21 MED ORDER — KETOROLAC TROMETHAMINE 30 MG/ML IJ SOLN
30.0000 mg | Freq: Four times a day (QID) | INTRAMUSCULAR | Status: AC | PRN
  Administered 2022-01-25 – 2022-01-26 (×2): 30 mg via INTRAVENOUS
  Filled 2022-01-21 (×4): qty 1

## 2022-01-21 MED ORDER — THIAMINE HCL 100 MG/ML IJ SOLN
200.0000 mg | Freq: Every day | INTRAVENOUS | Status: DC
  Administered 2022-01-21: 200 mg via INTRAVENOUS
  Filled 2022-01-21 (×3): qty 2

## 2022-01-21 NOTE — Assessment & Plan Note (Addendum)
History of leaving AMA's.  Expressed suicidal ideation.  Psychiatry on board.  Has been started on trazodone as per psychiatry. ?

## 2022-01-21 NOTE — Progress Notes (Signed)
?PROGRESS NOTE ? ? ? ?Gary [name]  TKP:[id number] DOB: 1986/06/02 DOA: 01/19/2022 ?PCP: Pcp, No  ? ? ?Brief Narrative:  ?Gary [name] is a 36 y.o. male with medical history significant of HIV not on treatment, polysubstance abuse (including but not limited to alcohol, tobacco, methamphetamines, marijuana), SDH, seizures, alcohol withdrawal with auditory/visual hallucinations, and suicidal ideations presented to the hospital with auditory hallucinations blurry vision.  Patient was recently hospitalized from 2/22-2/28 with alcohol withdrawals requiring placement on a Precedex drip in the ICU, but ultimately left AGAINST MEDICAL ADVICE.  Since leaving the hospital,patient reports that he has been drinking 2 fifths of liquor per day, and his last drink was yesterday at around noon time on the day of presentation.  In the ED patient complained of chest pain shortness of breath and nausea and tingling and numbness in the feet.  His initial vitals.  Notable for mild tachycardia and tachypnea.  LFTs showed AST 77, ALT 79, alcohol level 323, and high-sensitivity troponin 4.  Acetaminophen and salicylate levels were undetectable.  UDS was negative.  Influenza and COVID-19 screening were negative.  Chest x-ray did not show any no acute abnormalities.  Patient was given 1 L of normal saline IV fluids and started on CIWA protocol.  Initial CIWA was 24.  Patient was involuntarily committed due to his reports of suicidal ideations.  ? ?  ?Assessment and Plan: ?* Alcohol withdrawal hallucinosis (HCC) alcohol use disorder, severe, dependence alcohol-induced mood disorder ?History of needing Precedex for severe alcohol withdrawal.  We will continue to monitor closely.  Currently on involuntary commitment from the ED due to suicidal ideation.  Continue CIWA protocol.  Might need Precedex if unimproved..  ContinueThiamine, folate, and MVI.Transitions of care has been consulted. Continue to monitor and consult PCCM if CIWA begins  to rise as patient has frequently required Precedex drip plus/minus phenobarbital.  Currently no hallucinations but feels anxious.  Wishes IV Ativan.  Explained to the patient about the alcohol withdrawal protocol ? ?Involuntary commitment secondary to suicidal ideation ?Patient expressed his thoughts of wanting to drink himself to death.  Continue one-to-one sitter.  We will consult psychiatry. ? ? ?Chest pain ?EKG troponin negative.  Continue Protonix.  GI cocktail.  Likely atypical chest pain  ? ?Transaminitis ?Chronic.  AST 77 and ALT 79 on presentation..  Secondary to patient's history of alcohol abuse.  Will monitor ? ?Suicidal ideation ?We will obtain psychiatry evaluation.  Continue involuntary commitment, one-to-one sitter.  Patient still expresses suicidal ideation. ? ?HIV disease (HCC) ?Last CD4 count was 215 on 11/07/2021.  Patient reports that he has been off Biktarvy for at least 2 weeks.  Reemphasized the need for reestablishing care with ID as outpatient.  Will need to follow-up with ID in the outpatient setting after discharge  ? ?Macrocytosis ?Chronic.  Thought to secondary to alcohol usage ? ?Substance induced mood disorder (HCC) ?History of leaving AMA's.  Expressed suicidal ideation.  We will get psychiatry evaluation ? ?Cigarette smoker ?Current smoker.  Counseling was done ? ?Polysubstance abuse (HCC) ?Urine drug screen this time was negative except for benzos. ? ? ? DVT prophylaxis: enoxaparin (LOVENOX) injection 40 mg Start: 01/20/22 0900 ? ? ?Code Status:   ?  Code Status: Full Code ? ?Disposition: Uncertain at this time ? ?Status is: Inpatient ? ?Remains inpatient appropriate because: Home need for closer monitoring, CIWA protocol, involuntary commitment, psychiatry evaluation ? ? Family Communication: Spoke with the patient at bedside ? ?Consultants:  ?Psychiatry consulted ? ?  Procedures:  ?None so far ? ?Antimicrobials:  ?None ? ?Anti-infectives (From admission, onward)  ? ? None  ? ?   ? ? ?Subjective: ?Today, patient was seen and examined at bedside.  Patient denies any hallucinations but still has suicidal ideation.  Complains of anxiety and tremors.  Wishes IV Ativan. ? ?Objective: ?Vitals:  ? 01/21/22 0751 01/21/22 0900 01/21/22 1104 01/21/22 1105  ?BP: 123/82   119/80  ?Pulse: 84 97  83  ?Resp: 18  16 16   ?Temp: 98.7 ?F (37.1 ?C)     ?TempSrc: Oral     ?SpO2: 95%   94%  ? ? ?Intake/Output Summary (Last 24 hours) at 01/21/2022 1127 ?Last data filed at 01/21/2022 0900 ?Gross per 24 hour  ?Intake 240 ml  ?Output --  ?Net 240 ml  ? ?There were no vitals filed for this visit. ? ?Physical Examination: ?There is no height or weight on file to calculate BMI.  ? ?General: Thinly built, not in obvious distress,disheveled ?HENT:   No scleral pallor or icterus noted. Oral mucosa is moist.  ?Chest:  Clear breath sounds.  Diminished breath sounds bilaterally. No crackles or wheezes.  ?CVS: S1 &S2 heard. No murmur.  Regular rate and rhythm. ?Abdomen: Soft, nontender, nondistended.  Bowel sounds are heard.   ?Extremities: No cyanosis, clubbing or edema.  Peripheral pulses are palpable.  Mild tremors of the upper extremities. ?Psych: Alert, awake and oriented, anxious mood, ?CNS:  No cranial nerve deficits.  Power equal in all extremities.  Mild tremors noted. ?Skin: Warm and dry.  ? ?Data Reviewed:  ? ?CBC: ?Recent Labs  ?Lab 01/19/22 ?2335  ?WBC 6.1  ?NEUTROABS 4.2  ?HGB 14.4  ?HCT 42.7  ?MCV 101.2*  ?PLT 261  ? ? ?Basic Metabolic Panel: ?Recent Labs  ?Lab 01/19/22 ?2335 01/20/22 ?01/22/22  ?NA 140  --   ?K 3.8  --   ?CL 103  --   ?CO2 23  --   ?GLUCOSE 109*  --   ?BUN 6  --   ?CREATININE 0.75  --   ?CALCIUM 9.2  --   ?MG  --  1.8  ?PHOS  --  4.5  ? ? ?Liver Function Tests: ?Recent Labs  ?Lab 01/19/22 ?2335  ?AST 77*  ?ALT 79*  ?ALKPHOS 61  ?BILITOT 0.4  ?PROT 7.2  ?ALBUMIN 3.9  ? ? ? ?Radiology Studies: ?DG Chest Portable 1 View ? ?Result Date: 01/20/2022 ?CLINICAL DATA:  Chest pain.  Alcoholism.   Hallucinations. EXAM: PORTABLE CHEST 1 VIEW COMPARISON:  12/31/2021 FINDINGS: Chronic asymmetric elevation of left hemidiaphragm is again noted. Stable cardiomediastinal contours. No pleural effusion or edema identified. No airspace densities. IMPRESSION: No acute cardiopulmonary abnormalities. Electronically Signed   By: 01/02/2022 M.D.   On: 01/20/2022 06:05   ? ? ? LOS: 1 day  ? ? ?01/22/2022, MD ?[facility] ?01/21/2022, 11:27 AM  ? ? ?

## 2022-01-21 NOTE — Consult Note (Signed)
Gary [name] ? ? ?Service Date: January 21, 2022 ?LOS:  LOS: 1 day  ? ? ?Assessment  ?Gary [name] is a 36 y.o. male admitted medically for 01/19/2022 11:06 PM for Etoh Withdrawal hallucinosis. He carries the psychiatric diagnoses of Etoh Use disorder and has a past medical history of  Polysubstance abuse, HIV untreated, macrocytic anemia, transaminitis, thrombocytopenia.Psychiatry was consulted for SI by Flora Lipps, MD.  ? ? ?His current presentation of tremor, decreased sleep, irritability, tachycardia, requiring Ativan for symptom relief is most consistent with Etoh Withdrawal. He meets criteria for [facility] use disorder, severe based on his history.  Patient has not current OP psychotropic medications.On initial examination, patient endorses passive SI, communicates a want to be sober, but also endorses multiple reasons he would not like to try rehab or medication assistance to continue sobriety after detox. Please see plan below for detailed recommendations.  ? ?Patient has a hx of leaving AMA while in detox, and returning to [facility] immediately after drinking. Patient had enough insight to recognize this pattern himself, but continues to endorse that he will only remain hospitalized if his version or conditions are met. Patient endorsed discomfort going through withdrawal and appears to have poor insight and unreasonable expectations as to what withdrawal is , what it takes to achieve sobriety, and what a sober life will look like. Despite offering patient resources to assist with his sobriety patient endorses obstacles and no solutions. Patient moreso perseverates on his current Ativan regimen. Patient has a hx of leaving AMA while in detox, and some of patient's responses toady were concerning that he is at risk for repeat AMA. Patient endorsed that he has been unhappy more in the past week with his homelessness due to the weather changes, and that now  that he has a hospital bed, he feeling more able to handle his "SI." This leads to the question of whether or not patient may try to leave once the weather improves.  ? ?Diagnoses:  ?Active Hospital problems: ?Principal Problem: ?  Alcohol withdrawal hallucinosis (HCC) alcohol use disorder, severe, dependence alcohol-induced mood disorder ?Active Problems: ?  HIV disease ([place]) ?  Polysubstance abuse ([place]) ?  Cigarette smoker ?  Alcohol use disorder, severe, dependence ([place]) ?  Transaminitis ?  Substance induced mood disorder ([place]) ?  Suicidal ideation ?  Macrocytosis ?  Chest pain ?  Involuntary commitment secondary to suicidal ideation ?  ? ? ?Plan  ?## Safety and Observation Level:  ?- Based on my clinical [name], I estimate the patient to be at low risk of self harm in the current setting ?- At this time, we recommend a routine level of observation. Patient endorsed the hospital is a protective factor. This decision is based on my review of the chart including patient's history and current presentation, interview of the patient, mental status examination, and consideration of suicide risk including evaluating suicidal ideation, plan, intent, suicidal or self-harm behaviors, risk factors, and protective factors. This judgment is based on our ability to directly address suicide risk, implement suicide prevention strategies and develop a safety plan while the patient is in the clinical setting. Please contact our team if there is a concern that risk level has changed. ? ? ?## Medications:  ?-- None at this time, did attempt to talk to patient about gabapentin or naltrexone. He refuses at this time endorsing that he believes he has tried the meds and believes they were not beneficial. Will likely  reassess tom, patient's cognitive function is likely impaired by his Etoh use, currently being treated for withdrawal and macrocytic anemia.  ?-- Changed Thiamine to 237m IV daily ?-- Would recommend that the  scheduled Ativan be increased, patient required approx 281mAtivan w/in 24hr period and has a hx of requiring approx 20 mg Ativan during the first few days of Detox ?## Medical Decision Making Capacity:  ?-- Not formally assessed ? ?## Further Work-up:  ?-- Recommend TSH ? ? ?-- most recent EKG on 3/14 had QtC of 457 ?-- Pertinent labwork reviewed earlier this admission includes: Trp (-), AST 77 and ALT 79 , Last CD4 count 215 11/07/2021, EtoH- 323, MCV- 101.2 ? ?## Disposition:  ?-- Per primary, possibly rehab ? ?## Behavioral / Environmental:  ?-- Ativan PRN for CIWA> 5, verbal redirection ? ?##Legal Status ?-- IVC'd in ED ? ?Thank you for this consult request. Recommendations have been communicated to the primary team.  We will continue to follow at this time.  ? ? ?PGY-2 ?JaFreida BusmanMD ? ? ?NEW  history  ?Relevant Aspects of Hospital Course:  ?[facility]. ? ?Patient Report:  ?On assessment today patient is Aox4. Patient reports that he came to this hospital in an attempt for sobriety. Patient reports that he was trying to drink himself to death 2 days ago, when he suddenly reached out to a friend. Patient reports that in the moment he realized that he used to drink for "fun" but "now I'm drinking to survive and it's not fun" any more. Patient reports that his friend suggested he get help. Patient reports that he multiple friends worried about his Etoh intake and that some have threatened to cut off their relationships if he does not change. Patient reports that this is his motivating factor this hospitalization to become sober. Patient reports that he was also tired of his homelessness. Patient reports that today he is feeling "a little more happy" and endorses that this is because he feels that he will get treatment in the hospital. Patient endorses the hospital as a protective factor and reports that he is not having SI w/ plan because "I have a hospital bed."  Patient endorses that he is still having some passive SI, but now that he is in the hospital he is "trying to chase " the thoughts away.  ? ?Patient reports that over the last 2 weeks he was sleeping well only because of EtoH. Patient endorses that he was also having low energy and low appetite. Patient denies anhedonia, guilt/ hopelessness/ worthlessness and change in concentration. Patient reports that he has has 2-3 SA in the pat all of which patient endorses attempting to "drink myself to death." On assessment today patient denies HI and VH. Patient endorses that he is hearing "babbling voices" but he has never heard these voices when he is sober.  ? ?Patient endorses that he is not normally anxious, but he currently is feeling as if someone is standing on his chest. Patient reports that he is only ever anxious when going through withdrawal.  ? ?Patient endorses that he has gone up to 7 mon sober, when he was in jail. Patient reports that when sober he recalls ability to go approx 2 nights without sleep and having increased energy, but no abnormal irritability. Patient endorsed that he went about his normal activities and no one ever appeared to endorse that patient appeared abnormal. Patient also denied having rapid thoughts during  this time period.   ? ?Patient denies hx of physical or sexual abuse.  ? ?Collateral information:  ?Defer ? ?Psychiatric History:  ?Information collected from EMR ?Alcohol abuse, tobacco abuse, polysubstance abuse ?1 inpatient psychiatric admission 07/26/2021 discharged on doxepin milligrams at bedtime for insomnia and nausea; and gabapentin 300 mg 3 times daily for alcohol withdrawal syndrome ?  ?Patient has multiple inpatient admissions for alcohol detoxification, he is going to multiple alcohol rehabilitation programs ?Family psych history:  Brother- Bipolar Disorder and EtOH abuse ?Mother and Father- Domenic Moras use disorder, remission ? ? ?Social History:  ?Homeless ? ?Tobacco use:  "Vapes nicotine "every once in a while" ?Alcohol use: Started at 36 yo, last week drank "2/5th vodka daily." ?Drug use: Denies ? ?Family History:  ?The patient's family history includes Alcohol abuse in his father and

## 2022-01-21 NOTE — Progress Notes (Signed)
Patient refused Lab draw this morning ?

## 2022-01-21 NOTE — Progress Notes (Signed)
Patient is sleeping ? 01/21/22 1206  ?CIWA-Ar  ?BP 108/80  ?Pulse Rate 94  ?Nausea and Vomiting 0  ?Tactile Disturbances 0  ?Tremor 0  ?Auditory Disturbances 0  ?Paroxysmal Sweats 0  ?Visual Disturbances 0  ?Anxiety 0  ?Headache, Fullness in Head 0  ?Agitation 0  ?Orientation and Clouding of Sensorium 0  ?CIWA-Ar Total 0  ? ? ?

## 2022-01-21 NOTE — Hospital Course (Addendum)
Gary [name] is a 36 y.o. male with medical history significant of HIV not on treatment, polysubstance abuse (including but not limited to alcohol, tobacco, methamphetamines, marijuana), SDH, seizures, alcohol withdrawal with auditory/visual hallucinations, and suicidal ideations presented to the hospital with auditory hallucinations blurry vision.  Patient was recently hospitalized from 2/22-2/28 with alcohol withdrawals requiring placement on a Precedex drip in the ICU, but ultimately left AGAINST MEDICAL ADVICE.  Since leaving the hospital,patient reports that he has been drinking 2 fifths of liquor per day, and his last drink was yesterday at around noon time on the day of presentation.  In the ED, patient complained of chest pain shortness of breath and nausea and tingling and numbness in the feet.  His initial vitals were notable for mild tachycardia and tachypnea.  LFTs showed AST 77, ALT 79, alcohol level 323, and high-sensitivity troponin 4.  Acetaminophen and salicylate levels were undetectable.  UDS was negative.  Influenza and COVID-19 screening were negative.  Chest x-ray did not show any no acute abnormalities.  Patient was given 1 L of normal saline IV fluids and started on CIWA protocol.  Initial CIWA was 24.  Patient was involuntarily committed due to his reports of suicidal ideations. ?

## 2022-01-21 NOTE — Progress Notes (Signed)
?  Transition of Care (TOC) Screening Note ? ? ?Patient Details  ?Name: Gary [name] ?Date of Birth: 14-Aug-1986 ? ? ?Transition of Care (TOC) CM/SW Contact:    ?Harriet Masson, RN ?Phone Number: ?01/21/2022, 9:13 AM ? ? ? ?Transition of Care Department [facility]) has reviewed patient. Patient needs substance abuse counseling  We will continue to monitor patient advancement through interdisciplinary progression rounds. ?

## 2022-01-21 NOTE — Assessment & Plan Note (Deleted)
History of needing Precedex for severe alcohol withdrawal.  We will continue to monitor closely.  Currently on involuntary commitment due to suicidal ideation.  Continue CIWA protocol.  Might need Precedex if unimproved. ?

## 2022-01-21 NOTE — Assessment & Plan Note (Addendum)
Psychiatry has seen the patient.  Continue involuntary commitment, one-to-one sitter.  Patient still expresses suicidal ideation. ?

## 2022-01-22 DIAGNOSIS — E876 Hypokalemia: Secondary | ICD-10-CM

## 2022-01-22 LAB — CBC
HCT: 26.3 % — ABNORMAL LOW (ref 39.0–52.0)
Hemoglobin: 8.6 g/dL — ABNORMAL LOW (ref 13.0–17.0)
MCH: 29.7 pg (ref 26.0–34.0)
MCHC: 32.7 g/dL (ref 30.0–36.0)
MCV: 90.7 fL (ref 80.0–100.0)
Platelets: 413 10*3/uL — ABNORMAL HIGH (ref 150–400)
RBC: 2.9 MIL/uL — ABNORMAL LOW (ref 4.22–5.81)
RDW: 15.5 % (ref 11.5–15.5)
WBC: 14.4 10*3/uL — ABNORMAL HIGH (ref 4.0–10.5)
nRBC: 0 % (ref 0.0–0.2)

## 2022-01-22 LAB — BASIC METABOLIC PANEL
Anion gap: 10 (ref 5–15)
BUN: 7 mg/dL (ref 6–20)
CO2: 24 mmol/L (ref 22–32)
Calcium: 8.9 mg/dL (ref 8.9–10.3)
Chloride: 105 mmol/L (ref 98–111)
Creatinine, Ser: 0.76 mg/dL (ref 0.61–1.24)
GFR, Estimated: >60 mL/min (ref >60)
Glucose, Bld: 96 mg/dL (ref 70–99)
Potassium: 3.1 mmol/L — ABNORMAL LOW (ref 3.5–5.1)
Sodium: 139 mmol/L (ref 135–145)

## 2022-01-22 LAB — MAGNESIUM: Magnesium: 2.1 mg/dL (ref 1.7–2.4)

## 2022-01-22 MED ORDER — TRAZODONE HCL 50 MG PO TABS
50.0000 mg | ORAL_TABLET | Freq: Every day | ORAL | Status: DC
  Administered 2022-01-22 – 2022-01-26 (×5): 50 mg via ORAL

## 2022-01-22 MED ORDER — MAGNESIUM OXIDE -MG SUPPLEMENT 400 (240 MG) MG PO TABS
400.0000 mg | ORAL_TABLET | Freq: Two times a day (BID) | ORAL | Status: DC
Start: 2022-01-22 — End: 2022-01-27
  Administered 2022-01-22 – 2022-01-27 (×10): 400 mg via ORAL

## 2022-01-22 MED ORDER — POTASSIUM CHLORIDE CRYS ER 20 MEQ PO TBCR
40.0000 meq | EXTENDED_RELEASE_TABLET | Freq: Two times a day (BID) | ORAL | Status: AC
  Administered 2022-01-22 – 2022-01-23 (×2): 40 meq via ORAL

## 2022-01-22 NOTE — Progress Notes (Signed)
Pt remains asleep laying on left side. Resp easy. Sitter at bedside ?

## 2022-01-22 NOTE — Progress Notes (Signed)
Discussed earlier CIWA results and why RRT wasn't called. Also discussed present CIWA result. ?

## 2022-01-22 NOTE — Consult Note (Signed)
[name] Psychiatry Followup Face-to-Face Psychiatric Evaluation ? ? ?Service Date: January 22, 2022 ?LOS:  LOS: 2 days  ? ? ?Assessment  ?Gary [name] is a 36 y.o. male admitted medically for 01/19/2022 11:06 PM for Etoh Withdrawal hallucinosis. He carries the psychiatric diagnoses of Etoh Use disorder and has a past medical history of  Polysubstance abuse, HIV untreated, macrocytic anemia, transaminitis, thrombocytopenia.Psychiatry was consulted for SI by Joycelyn Das, MD.  ?  ?  ?His current presentation of tremor, decreased sleep, irritability, tachycardia, requiring Ativan for symptom relief is most consistent with Etoh Withdrawal. He meets criteria for [facility] use disorder, severe based on his history.  Patient has not current OP psychotropic medications.On initial examination, patient endorses passive SI, communicates a want to be sober, but also endorses multiple reasons he would not like to try rehab or medication assistance to continue sobriety after detox. Please see plan below for detailed recommendations.  ? ?3/16: On assessment patient appears to continue to want detox and voice intentions of sobriety. Patient is consistent is his goals and appears to be in a better mood today. Patient also appears to have some improved insight and judgement by requesting to be back on his HIV regimen. Patient was also interested in medication for insomnia, and was willing to take something that had previously worked for him. ? ?Diagnoses:  ?Active Hospital problems: ?Principal Problem: ?  Alcohol withdrawal hallucinosis (HCC) alcohol use disorder, severe, dependence alcohol-induced mood disorder ?Active Problems: ?  HIV disease (HCC) ?  Polysubstance abuse (HCC) ?  Cigarette smoker ?  Alcohol use disorder, severe, dependence (HCC) ?  Transaminitis ?  Substance induced mood disorder (HCC) ?  Suicidal ideation ?  Macrocytosis ?  Chest pain ?  Involuntary commitment secondary to suicidal ideation ?  ? ? ?Plan   ?## Safety and Observation Level:  ?- Based on my clinical evaluation, I estimate the patient to be at low risk of self harm in the current setting ?- At this time, we recommend a change to routine level of observation. This decision is based on my review of the chart including patient's history and current presentation, interview of the patient, mental status examination, and consideration of suicide risk including evaluating suicidal ideation, plan, intent, suicidal or self-harm behaviors, risk factors, and protective factors. This judgment is based on our ability to directly address suicide risk, implement suicide prevention strategies and develop a safety plan while the patient is in the clinical setting. Please contact our team if there is a concern that risk level has changed. ? ? ?## Medications:  ?-- Trazodone 50mg  QHS, insomnia ? ?## Medical Decision Making Capacity:  ?-- Not formally assessed ?  ?## Further Work-up:  ?-- Recommend TSH ?  ?  ?-- most recent EKG on 3/14 had QtC of 457 ?-- Pertinent labwork reviewed earlier this admission includes: Trp (-), AST 77 and ALT 79 , Last CD4 count 215 11/07/2021, EtoH- 323, MCV- 101.2 ?  ?## Disposition:  ?-- Per primary ?  ?## Behavioral / Environmental:  ?-- Ativan PRN for CIWA> 5, verbal redirection ?  ?##Legal Status ?-- IVC'd in ED ?  ?Thank you for this consult request. Recommendations have been communicated to the primary team.  We will continue to follow at this time.  ?  ?  ?PGY-2 ?11/09/2021, MD ? ? ? followup history  ?Relevant Aspects of Hospital Course:  ?Admitted on 01/19/2022 for [facility] withdrawal, hallucinosis. ? ?Patient Report:  ?On assessment  patient reports he is "feeling better, a little shaky." Patient reports that his mood is "ok" and he has been thinking about his sobriety. Patient endorses that he is thinking of going to AA and getting a job. Patient reports that he does not want residential rehab or an IOP. 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 Patient endorses that he does not think medication is necessary for his sobriety. Patient reports that he does not believe he will have cravings once through detox. Patient reports that in the past he has been able to quit cold- [place] and not have residual cravings.Patient denies SI, HI and AVH on assessment. Patient endorses that he is eating well, but would like to sleep better. ? ?Patient endorsed that he would like to start medication for sleep and recalled he had had success with Trazodone in the past. The benefits and risks of trazodone vs low dose Seroquel for insomnia were discussed with patient. Patient consented to starting Trazodone. Patient recalled previous dosing up to 100mg . Patient also endorsed interest in getting restarted on his HIV regimen.   ?  ?Collateral information:  ?Defer ? ?Psychiatric History:  ?Information collected from EMR ?Alcohol abuse, tobacco abuse, polysubstance abuse ?1 inpatient psychiatric admission 07/26/2021 discharged on doxepin milligrams at bedtime for insomnia and nausea; and gabapentin 300 mg 3 times daily for alcohol withdrawal syndrome ?  ?Patient has multiple inpatient admissions for alcohol detoxification, he is going to multiple alcohol rehabilitation programs ?Family psych history:  Brother- Bipolar Disorder and EtOH abuse ?Mother and Father- 07/28/2021 use disorder, remission ?  ?  ?Social History:  ?Homeless ?  ?Tobacco use: "Vapes nicotine "every once in a while" ?Alcohol use: Started at 36 yo, last week drank "2/5th vodka daily." ?Drug use: Denies ? ?Family History:  ? ?The patient's family history includes Alcohol abuse in his father and mother. ? ?Medical History: ?Past Medical History:  ?Diagnosis Date  ? Alcohol abuse   ? HIV (human immunodeficiency virus infection) (HCC)   ? Seizures (HCC)   ? Subdural hematoma   ? ? ?Surgical  History: ?Past Surgical History:  ?Procedure Laterality Date  ? INCISION AND DRAINAGE PERIRECTAL ABSCESS N/A 09/24/2016  ? Procedure: IRRIGATION AND DEBRIDEMENT PERIRECTAL ABSCESS;  Surgeon: 09/26/2016, MD;  Location: [facility] ORS;  Service: General;  Laterality: N/A;  ? none    ? ? ?Medications:  ? ?Current Facility-Administered Medications:  ?  acetaminophen (TYLENOL) tablet 650 mg, 650 mg, Oral, Q6H PRN, 650 mg at 01/20/22 1529 **OR** acetaminophen (TYLENOL) suppository 650 mg, 650 mg, Rectal, Q6H PRN, 01/22/22, Rondell A, MD ?  albuterol (PROVENTIL) (2.5 MG/3ML) 0.083% nebulizer solution 2.5 mg, 2.5 mg, Nebulization, Q6H PRN, Katrinka Blazing, Rondell A, MD ?  alum & mag hydroxide-simeth (MAALOX/MYLANTA) 200-200-20 MG/5ML suspension 30 mL, 30 mL, Oral, Q6H PRN **AND** lidocaine (XYLOCAINE) 2 % viscous mouth solution 15 mL, 15 mL, Oral, Q6H PRN, Smith, Rondell A, MD ?  enoxaparin (LOVENOX) injection 40 mg, 40 mg, Subcutaneous, Q24H, Smith, Rondell A, MD, 40 mg at 01/22/22 1124 ?  folic acid (FOLVITE) tablet 1 mg, 1 mg, Oral, Daily, Smith, Rondell A, MD, 1 mg at 01/22/22 1125 ?  gabapentin (NEURONTIN) capsule 300 mg, 300 mg, Oral, TID, Smith, Rondell A, MD, 300 mg at 01/22/22 1125 ?  hydrALAZINE (APRESOLINE) injection 10 mg, 10 mg, Intravenous, Q4H PRN, 01/24/22, Rondell  A, MD ?  ketorolac (TORADOL) 30 MG/ML injection 30 mg, 30 mg, Intravenous, Q6H PRN, Pokhrel, Laxman, MD ?  LORazepam (ATIVAN) injection 0-4 mg, 0-4 mg, Intravenous, Q12H **OR** LORazepam (ATIVAN) tablet 0-4 mg, 0-4 mg, Oral, Q12H, Carroll SageFaulkner, William J, PA-C, 3 mg at 01/22/22 1125 ?  LORazepam (ATIVAN) tablet 1-4 mg, 1-4 mg, Oral, Q1H PRN, 3 mg at 01/21/22 0820 **OR** LORazepam (ATIVAN) injection 1-4 mg, 1-4 mg, Intravenous, Q1H PRN, Katrinka BlazingSmith, Rondell A, MD, 2 mg at 01/21/22 0047 ?  multivitamin with minerals tablet 1 tablet, 1 tablet, Oral, Daily, Madelyn FlavorsSmith, Rondell A, MD, 1 tablet at 01/22/22 1125 ?  ondansetron (ZOFRAN) tablet 4 mg, 4 mg, Oral, Q6H PRN **OR**  ondansetron (ZOFRAN) injection 4 mg, 4 mg, Intravenous, Q6H PRN, Smith, Rondell A, MD ?  pantoprazole (PROTONIX) EC tablet 40 mg, 40 mg, Oral, Daily, Smith, Rondell A, MD, 40 mg at 01/22/22 1125 ?  sodium chloride flush (N

## 2022-01-22 NOTE — Plan of Care (Signed)
Pt specifically asks for IV Ativan. On CIWA protocol. Given po Ativan around 2230. Pt has a sitter at bedside r/t suicidal thoughts and plan on admission. Pt states he doesn't want to kill himself now and his plan is to go stay with friends that don't drink or do drugs when he is dischg'd. Pt stated he knows that drinking isn't good for him but he just can't stop.  ?

## 2022-01-22 NOTE — Progress Notes (Signed)
CIWA 21 but while doing assessment was watching pt's face and movements and pt would force the tremors in his hands to be worse and stated he could control what the voices were saying although their speech was clear, wouldn't say what they were saying. RRT wasn't called although orders say at this level it is to be called because pt's assessment didn't need a RRT called. ?

## 2022-01-22 NOTE — Assessment & Plan Note (Signed)
Potassium 3.1 today.  We will replenish with oral potassium twice daily for 3 doses.  Check levels in a.m. ?

## 2022-01-22 NOTE — Progress Notes (Signed)
?PROGRESS NOTE ? ? ? ?Gary [name]  BMW:[id number] DOB: 06/13/1986 DOA: 01/19/2022 ?PCP: Pcp, No  ? ? ?Brief Narrative:  ?Gary [name] is a 36 y.o. male with medical history significant of HIV not on treatment, polysubstance abuse (including but not limited to alcohol, tobacco, methamphetamines, marijuana), SDH, seizures, alcohol withdrawal with auditory/visual hallucinations, and suicidal ideations presented to the hospital with auditory hallucinations blurry vision.  Patient was recently hospitalized from 2/22-2/28 with alcohol withdrawals requiring placement on a Precedex drip in the ICU, but ultimately left AGAINST MEDICAL ADVICE.  Since leaving the hospital,patient reports that he has been drinking 2 fifths of liquor per day, and his last drink was yesterday at around noon time on the day of presentation.  In the ED, patient complained of chest pain shortness of breath and nausea and tingling and numbness in the feet.  His initial vitals were notable for mild tachycardia and tachypnea.  LFTs showed AST 77, ALT 79, alcohol level 323, and high-sensitivity troponin 4.  Acetaminophen and salicylate levels were undetectable.  UDS was negative.  Influenza and COVID-19 screening were negative.  Chest x-ray did not show any no acute abnormalities.  Patient was given 1 L of normal saline IV fluids and started on CIWA protocol.  Initial CIWA was 24.  Patient was involuntarily committed due to his reports of suicidal ideations.  ? ?  ?Assessment and Plan: ?* Alcohol withdrawal hallucinosis (HCC) alcohol use disorder, severe, dependence alcohol-induced mood disorder ?History of needing Precedex/phenobarbital for severe alcohol withdrawal.  We will continue to monitor closely.  Currently on involuntary commitment from the ED due to suicidal ideation.  Continue CIWA protocol.  Might need Precedex if unimproved.  Continue IV thiamine, p.o. folate, and MVI. Transitions of care has been consulted.  Complains of mild  visual hallucination.  ? ?Involuntary commitment secondary to suicidal ideation ?Patient had initially expressed his thoughts of wanting to drink himself to death.  Continue one-to-one sitter.  Psychiatry has seen the patient today.  Been started on trazodone. ? ? ?Chest pain ?EKG/ troponin negative.  Continue Protonix.  GI cocktail.  No further chest pain today. ? ?Transaminitis ?Chronic.  AST 77 and ALT 79 on presentation..  Secondary to patient's history of alcohol abuse.  Check LFTs in AM. ? ?Suicidal ideation ?Psychiatry has seen the patient.  Continue involuntary commitment, one-to-one sitter.  Patient still expresses suicidal ideation. ? ?HIV disease (HCC) ?Last CD4 count was 215 on 11/07/2021.  Patient reports that he has been off Biktarvy for at least 2 weeks.  Restarted on Biktarvy.  We will need to reestablish care at the outpatient setting with ID ? ?Hypokalemia ?Potassium 3.1 today.  We will replenish with oral potassium twice daily for 3 doses.  Check levels in a.m. ? ?Macrocytosis ?Chronic.  Thought to secondary to alcohol usage ? ?Substance induced mood disorder (HCC) ?History of leaving AMA's.  Expressed suicidal ideation.  Psychiatry on board.  Has been started on trazodone as per psychiatry. ? ?Cigarette smoker ?Current smoker.  Counseling was done ? ?Polysubstance abuse (HCC) ?Urine drug screen this time was negative except for benzos.  We will continue to monitor closely.  Patient was intoxicated on arrival.  Continue CIWA protocol. ? ? ? DVT prophylaxis: enoxaparin (LOVENOX) injection 40 mg Start: 01/20/22 0900 ? ? ?Code Status:   ?  Code Status: Full Code ? ?Disposition: Uncertain at this time ? ?Status is: Inpatient ? ?Remains inpatient appropriate because:  CIWA protocol, involuntary commitment, psychiatry evaluation ? ?  Family Communication:  ?Spoke with the patient at bedside ? ?Consultants:  ?Psychiatry ? ?Procedures:  ?None so far ? ?Antimicrobials:  ?None ? ?Anti-infectives (From  admission, onward)  ? ? None  ? ?  ? ? ?Subjective: ?Today, patient was seen and examined at bedside.  Complains of mild visual hallucinations and auditory hallucinations.  Denies diaphoresis chest pain shortness of breath fever chills.  One-to-one sitter at bedside ? ?Objective: ?Vitals:  ? 01/21/22 2003 01/22/22 0100 01/22/22 0551 01/22/22 1004  ?BP:  116/86 (!) 93/54 115/78  ?Pulse: 89 85 79 80  ?Resp: 14 12 16    ?Temp: 98.9 ?F (37.2 ?C)  98.8 ?F (37.1 ?C)   ?TempSrc: Oral  Oral   ?SpO2: 95% 95% 97% 96%  ? ? ?Intake/Output Summary (Last 24 hours) at 01/22/2022 1316 ?Last data filed at 01/21/2022 01/23/2022 ?Gross per 24 hour  ?Intake 720 ml  ?Output --  ?Net 720 ml  ? ?There were no vitals filed for this visit. ? ?Physical Examination: ?There is no height or weight on file to calculate BMI.  ? ?General: Thinly built, not in obvious distress, communicative ?HENT:   No scleral pallor or icterus noted. Oral mucosa is moist.  ?Chest:  Clear breath sounds.  Diminished breath sounds bilaterally. No crackles or wheezes.  ?CVS: S1 &S2 heard. No murmur.  Regular rate and rhythm. ?Abdomen: Soft, nontender, nondistended.  Bowel sounds are heard.   ?Extremities: No cyanosis, clubbing or edema.  Peripheral pulses are palpable.  Mild tremors of the upper extremities. ?Psych: Alert, awake and oriented, normal mood ?CNS:  No cranial nerve deficits.  Moves all extremities.  Mild tremors noted. ?Skin: Warm and dry.  No rashes noted. ? ?Data Reviewed:  ? ?CBC: ?Recent Labs  ?Lab 01/19/22 ?2335 01/22/22 ?1008  ?WBC 6.1 14.4*  ?NEUTROABS 4.2  --   ?HGB 14.4 8.6*  ?HCT 42.7 26.3*  ?MCV 101.2* 90.7  ?PLT 261 413*  ? ? ?Basic Metabolic Panel: ?Recent Labs  ?Lab 01/19/22 ?2335 01/20/22 ?01/22/22 01/22/22 ?1008  ?NA 140  --  139  ?K 3.8  --  3.1*  ?CL 103  --  105  ?CO2 23  --  24  ?GLUCOSE 109*  --  96  ?BUN 6  --  7  ?CREATININE 0.75  --  0.76  ?CALCIUM 9.2  --  8.9  ?MG  --  1.8 2.1  ?PHOS  --  4.5  --   ? ? ?Liver Function Tests: ?Recent Labs   ?Lab 01/19/22 ?2335  ?AST 77*  ?ALT 79*  ?ALKPHOS 61  ?BILITOT 0.4  ?PROT 7.2  ?ALBUMIN 3.9  ? ? ? ?Radiology Studies: ?No results found. ? ? ? LOS: 2 days  ? ? ?2336, MD ?[facility] ?01/22/2022, 1:16 PM  ? ? ?

## 2022-01-23 LAB — CBC
HCT: 39.9 % (ref 39.0–52.0)
Hemoglobin: 13.4 g/dL (ref 13.0–17.0)
MCH: 34.3 pg — ABNORMAL HIGH (ref 26.0–34.0)
MCHC: 33.6 g/dL (ref 30.0–36.0)
MCV: 102 fL — ABNORMAL HIGH (ref 80.0–100.0)
Platelets: 163 10*3/uL (ref 150–400)
RBC: 3.91 MIL/uL — ABNORMAL LOW (ref 4.22–5.81)
RDW: 13.4 % (ref 11.5–15.5)
WBC: 4.7 10*3/uL (ref 4.0–10.5)
nRBC: 0 % (ref 0.0–0.2)

## 2022-01-23 LAB — COMPREHENSIVE METABOLIC PANEL
ALT: 48 U/L — ABNORMAL HIGH (ref 0–44)
AST: 40 U/L (ref 15–41)
Albumin: 3.6 g/dL (ref 3.5–5.0)
Alkaline Phosphatase: 41 U/L (ref 38–126)
Anion gap: 7 (ref 5–15)
BUN: 12 mg/dL (ref 6–20)
CO2: 26 mmol/L (ref 22–32)
Calcium: 9.2 mg/dL (ref 8.9–10.3)
Chloride: 102 mmol/L (ref 98–111)
Creatinine, Ser: 0.81 mg/dL (ref 0.61–1.24)
GFR, Estimated: >60 mL/min (ref >60)
Glucose, Bld: 105 mg/dL — ABNORMAL HIGH (ref 70–99)
Potassium: 4.2 mmol/L (ref 3.5–5.1)
Sodium: 135 mmol/L (ref 135–145)
Total Bilirubin: 0.5 mg/dL (ref 0.3–1.2)
Total Protein: 6.7 g/dL (ref 6.5–8.1)

## 2022-01-23 LAB — MAGNESIUM: Magnesium: 2.1 mg/dL (ref 1.7–2.4)

## 2022-01-23 MED ORDER — CHLORPROMAZINE HCL 10 MG PO TABS
10.0000 mg | ORAL_TABLET | Freq: Two times a day (BID) | ORAL | Status: DC | PRN
  Administered 2022-01-24: 10 mg via ORAL
  Filled 2022-01-23 (×3): qty 1

## 2022-01-23 MED ORDER — THIAMINE HCL 100 MG PO TABS
200.0000 mg | ORAL_TABLET | Freq: Every day | ORAL | Status: DC
  Administered 2022-01-23 – 2022-01-27 (×5): 200 mg via ORAL

## 2022-01-23 MED ORDER — CHLORPROMAZINE HCL 10 MG PO TABS
10.0000 mg | ORAL_TABLET | Freq: Two times a day (BID) | ORAL | Status: DC
  Administered 2022-01-23 – 2022-01-24 (×3): 10 mg via ORAL
  Filled 2022-01-23 (×4): qty 1

## 2022-01-23 NOTE — Progress Notes (Signed)
Patient's IVC has been rescinded. Commitment change paperwork faxed to magistrate.  ? ?Cristobal Goldmann ?LCSW, MSW, MHA ? ?

## 2022-01-23 NOTE — TOC Progression Note (Signed)
Transition of Care (TOC) - Progression Note  ? ? ?Patient Details  ?Name: Gary [name] ?MRN: [id number] ?Date of Birth: 10/07/1986 ? ?Transition of Care (TOC) CM/SW Contact  ? D , Student-Social Work ?Phone Number: ?01/23/2022, 3:34 PM ? ?Clinical Narrative:    ?CSW intern visited patient at bedside for a check in and assessment of needs. The patient states he has been experiencing homelessness for about 6 years and is seeking help with housing and employment opportunities. The patient has dealt with alcohol use disorder for many years and states he will no longer drink.. The patient and I discussed resources such as the [facility] that can assist with having his needs met once discharged from the hospital. CSW intern provided supportive counseling and listened to the patients goals. CSW intern will  also check to see if there are any clothes available for the patient. ? ?  ?  ? ?Expected Discharge Plan and Services ?  ?  ?  ?  ?  ?                ?  ?  ?  ?  ?  ?  ?  ?  ?  ?  ? ? ?Social Determinants of Health (SDOH) Interventions ?  ? ?Readmission Risk Interventions ?No flowsheet data found. ? ?

## 2022-01-23 NOTE — Progress Notes (Addendum)
?PROGRESS NOTE ? ? ? ?Gary [name]  ZOX:[id number] DOB: 12/04/1985 DOA: 01/19/2022 ?PCP: Pcp, No ? ? ?Brief Narrative:  ?Gary [name] is a 36 y.o. male with medical history significant of HIV not on treatment, polysubstance abuse (including but not limited to alcohol, tobacco, methamphetamines, marijuana), SDH, seizures, alcohol withdrawal with auditory/visual hallucinations, and suicidal ideations presented to the hospital with auditory hallucinations blurry vision.  Patient was recently hospitalized from 2/22-2/28 with alcohol withdrawals requiring placement on a Precedex drip in the ICU, but ultimately left AGAINST MEDICAL ADVICE.  Since leaving the hospital,patient reports that he has been drinking 2 fifths of liquor per day, and his last drink was yesterday at around noon time on the day of presentation.  In the ED, patient complained of chest pain shortness of breath and nausea and tingling and numbness in the feet.  His initial vitals were notable for mild tachycardia and tachypnea.  LFTs showed AST 77, ALT 79, alcohol level 323, and high-sensitivity troponin 4.  Acetaminophen and salicylate levels were undetectable.  UDS was negative.  Influenza and COVID-19 screening were negative.  Chest x-ray did not show any no acute abnormalities.  Patient was given 1 L of normal saline IV fluids and started on CIWA protocol.  Initial CIWA was 24.  Patient was involuntarily committed due to his reports of suicidal ideations.  ? ?Assessment & Plan: ?  ?Alcohol withdrawal hallucinosis (HCC)  ?-History of needing Precedex/phenobarbital for severe alcohol withdrawal.  ?- We will continue to monitor closely.  ?- Continue CIWA protocol.   ?-Continue IV thiamine, p.o. folate, and MVI. ?-On fall/seizure precautions. ?-Consult PT/OT ?  ?suicidal ideation ?-Patient had initially expressed his thoughts of wanting to drink himself to death.  Continue one-to-one sitter.   ?-Psychiatry consulted  ?-On trazodone and  Thorazine. ?-Appreciate psychiatry evaluation.  ? ?Chest pain ?-EKG/ troponin negative.  Continue Protonix.  GI cocktail.  No further chest pain today. ?  ?Transaminitis ?-Chronic.  AST 77 and ALT 79 on presentation..  Secondary to patient's history of alcohol abuse.  AST: WNL.  ALT: 46.  Continue to monitor. ?  ?HIV disease (HCC) ?-Last CD4 count was 215 on 11/07/2021.  ?-Check HIV viral load and CD4 count and will restart Biktarvy. ?-Need to follow-up with ID outpatient ? ?Hypokalemia ?-Resolved ? ?Macrocytosis ?-Chronic.  Thought to secondary to alcohol usage ?  ?Cigarette smoker ?-Current smoker.  Counseling was done ?  ?Polysubstance abuse (HCC) ?-Urine drug screen this time was negative except for benzos.   ?-Recommend cessation ?  ? ?DVT prophylaxis: Lovenox ?Code Status: Full code ?Family Communication:  None present at bedside.  Plan of care discussed with patient in length and he verbalized understanding and agreed with it. ?Disposition Plan: To be determined ? ?Consultants:  ?Psychiatry ? ?Procedures:  ?None ? ?Antimicrobials:  ?None ? ?Status is: Inpatient ? ? ?Subjective: ?Patient seen and examined.  Sitting comfortably on the bed.  Reports that he still feels some anxious however overall improved.  No depressed mood, suicidal or homicidal thoughts.  Has been eating fine.  No acute events overnight. ? ?Objective: ?Vitals:  ? 01/23/22 0400 01/23/22 0741 01/23/22 0840 01/23/22 1247  ?BP:  94/60 101/72 107/66  ?Pulse:  72 85 83  ?Resp:  13 19 18   ?Temp: 98.2 ?F (36.8 ?C)   98.2 ?F (36.8 ?C)  ?TempSrc:    Oral  ?SpO2:  96% 98% 96%  ? ? ?Intake/Output Summary (Last 24 hours) at 01/23/2022 1435 ?Last data  filed at 01/23/2022 [phone] ?Gross per 24 hour  ?Intake 240 ml  ?Output --  ?Net 240 ml  ? ?There were no vitals filed for this visit. ? ?Examination: ? ?General exam: Appears calm and comfortable.  Has tremors in his hand  ?respiratory system: Clear to auscultation. Respiratory effort normal. ?Cardiovascular  system: S1 & S2 heard, RRR. No JVD, murmurs, rubs, gallops or clicks. No pedal edema. ?Gastrointestinal system: Abdomen is nondistended, soft and nontender. No organomegaly or masses felt. Normal bowel sounds heard. ?Central nervous system: Alert and oriented. No focal neurological deficits. ?Skin: No rashes, lesions or ulcers ?Psychiatry: Appears anxious ? ?Data Reviewed: I have personally reviewed following labs and imaging studies ? ?CBC: ?Recent Labs  ?Lab 01/19/22 ?2335 01/22/22 ?1008 01/23/22 ?0240  ?WBC 6.1 14.4* 4.7  ?NEUTROABS 4.2  --   --   ?HGB 14.4 8.6* 13.4  ?HCT 42.7 26.3* 39.9  ?MCV 101.2* 90.7 102.0*  ?PLT 261 413* 163  ? ?Basic Metabolic Panel: ?Recent Labs  ?Lab 01/19/22 ?2335 01/20/22 ?9678 01/22/22 ?1008 01/23/22 ?0240  ?NA 140  --  139 135  ?K 3.8  --  3.1* 4.2  ?CL 103  --  105 102  ?CO2 23  --  24 26  ?GLUCOSE 109*  --  96 105*  ?BUN 6  --  7 12  ?CREATININE 0.75  --  0.76 0.81  ?CALCIUM 9.2  --  8.9 9.2  ?MG  --  1.8 2.1 2.1  ?PHOS  --  4.5  --   --   ? ?GFR: ?CrCl cannot be calculated (Unknown ideal weight.). ?Liver Function Tests: ?Recent Labs  ?Lab 01/19/22 ?2335 01/23/22 ?0240  ?AST 77* 40  ?ALT 79* 48*  ?ALKPHOS 61 41  ?BILITOT 0.4 0.5  ?PROT 7.2 6.7  ?ALBUMIN 3.9 3.6  ? ?No results for input(s): LIPASE, AMYLASE in the last 168 hours. ?No results for input(s): AMMONIA in the last 168 hours. ?Coagulation Profile: ?No results for input(s): INR, PROTIME in the last 168 hours. ?Cardiac Enzymes: ?No results for input(s): CKTOTAL, CKMB, CKMBINDEX, TROPONINI in the last 168 hours. ?BNP (last 3 results) ?No results for input(s): PROBNP in the last 8760 hours. ?HbA1C: ?No results for input(s): HGBA1C in the last 72 hours. ?CBG: ?No results for input(s): GLUCAP in the last 168 hours. ?Lipid Profile: ?No results for input(s): CHOL, HDL, LDLCALC, TRIG, CHOLHDL, LDLDIRECT in the last 72 hours. ?Thyroid Function Tests: ?No results for input(s): TSH, T4TOTAL, FREET4, T3FREE, THYROIDAB in the last 72  hours. ?Anemia Panel: ?No results for input(s): VITAMINB12, FOLATE, FERRITIN, TIBC, IRON, RETICCTPCT in the last 72 hours. ?Sepsis Labs: ?No results for input(s): PROCALCITON, LATICACIDVEN in the last 168 hours. ? ?Recent Results (from the past 240 hour(s))  ?Resp Panel by RT-PCR (Flu A&B, Covid) Nasopharyngeal Swab     Status: None  ? Collection Time: 01/19/22 11:47 PM  ? Specimen: Nasopharyngeal Swab; Nasopharyngeal(NP) swabs in vial transport medium  ?Result Value Ref Range Status  ? SARS Coronavirus 2 by RT PCR NEGATIVE NEGATIVE Final  ?  Comment: (NOTE) ?SARS-CoV-2 target nucleic acids are NOT DETECTED. ? ?The SARS-CoV-2 RNA is generally detectable in upper respiratory ?specimens during the acute phase of infection. The lowest ?concentration of SARS-CoV-2 viral copies this assay can detect is ?138 copies/mL. A negative result does not preclude SARS-Cov-2 ?infection and should not be used as the sole basis for treatment or ?other patient management decisions. A negative result may occur with  ?improper specimen collection/handling, submission  of specimen other ?than nasopharyngeal swab, presence of viral mutation(s) within the ?areas targeted by this assay, and inadequate number of viral ?copies(<138 copies/mL). A negative result must be combined with ?clinical observations, patient history, and epidemiological ?information. The expected result is Negative. ? ?Fact Sheet for Patients:  ?BloggerCourse.com ? ?Fact Sheet for Healthcare Providers:  ?SeriousBroker.it ? ?This test is no t yet approved or cleared by the [place] FDA and  ?has been authorized for detection and/or diagnosis of SARS-CoV-2 by ?FDA under an Emergency Use Authorization (EUA). This EUA will remain  ?in effect (meaning this test can be used) for the duration of the ?COVID-19 declaration under Section 564(b)(1) of the Act, 21 ?U.S.C.section 360bbb-3(b)(1), unless the authorization is  terminated  ?or revoked sooner.  ? ? ?  ? Influenza A by PCR NEGATIVE NEGATIVE Final  ? Influenza B by PCR NEGATIVE NEGATIVE Final  ?  Comment: (NOTE) ?The Xpert Xpress SARS-CoV-2/FLU/RSV plus assay is intended as an aid ?in t
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
## 2022-01-24 LAB — CBC
HCT: 38.5 % — ABNORMAL LOW (ref 39.0–52.0)
Hemoglobin: 13.1 g/dL (ref 13.0–17.0)
MCH: 34.7 pg — ABNORMAL HIGH (ref 26.0–34.0)
MCHC: 34 g/dL (ref 30.0–36.0)
MCV: 102.1 fL — ABNORMAL HIGH (ref 80.0–100.0)
Platelets: 138 10*3/uL — ABNORMAL LOW (ref 150–400)
RBC: 3.77 MIL/uL — ABNORMAL LOW (ref 4.22–5.81)
RDW: 13.2 % (ref 11.5–15.5)
WBC: 3.6 10*3/uL — ABNORMAL LOW (ref 4.0–10.5)
nRBC: 0 % (ref 0.0–0.2)

## 2022-01-24 LAB — COMPREHENSIVE METABOLIC PANEL
ALT: 46 U/L — ABNORMAL HIGH (ref 0–44)
AST: 32 U/L (ref 15–41)
Albumin: 3.6 g/dL (ref 3.5–5.0)
Alkaline Phosphatase: 40 U/L (ref 38–126)
Anion gap: 7 (ref 5–15)
BUN: 13 mg/dL (ref 6–20)
CO2: 26 mmol/L (ref 22–32)
Calcium: 9.6 mg/dL (ref 8.9–10.3)
Chloride: 102 mmol/L (ref 98–111)
Creatinine, Ser: 0.74 mg/dL (ref 0.61–1.24)
GFR, Estimated: >60 mL/min (ref >60)
Glucose, Bld: 118 mg/dL — ABNORMAL HIGH (ref 70–99)
Potassium: 4.3 mmol/L (ref 3.5–5.1)
Sodium: 135 mmol/L (ref 135–145)
Total Bilirubin: 0.4 mg/dL (ref 0.3–1.2)
Total Protein: 6.8 g/dL (ref 6.5–8.1)

## 2022-01-24 LAB — MAGNESIUM: Magnesium: 2.1 mg/dL (ref 1.7–2.4)

## 2022-01-24 MED ORDER — BICTEGRAVIR-EMTRICITAB-TENOFOV 50-200-25 MG PO TABS
1.0000 | ORAL_TABLET | Freq: Every day | ORAL | Status: DC
  Administered 2022-01-24 – 2022-01-27 (×4): 1 via ORAL
  Filled 2022-01-24 (×4): qty 1

## 2022-01-24 MED ORDER — CHLORPROMAZINE HCL 10 MG PO TABS
10.0000 mg | ORAL_TABLET | Freq: Three times a day (TID) | ORAL | Status: DC
  Administered 2022-01-24 – 2022-01-27 (×9): 10 mg via ORAL
  Filled 2022-01-24 (×11): qty 1

## 2022-01-24 MED ORDER — POLYETHYLENE GLYCOL 3350 17 G PO PACK
17.0000 g | PACK | Freq: Every day | ORAL | Status: DC | PRN
  Administered 2022-01-24: 17 g via ORAL

## 2022-01-24 NOTE — Evaluation (Signed)
Occupational Therapy Evaluation ?Patient Details ?Name: Gary [name] ?MRN: [id number] ?DOB: 31-May-1986 ?Today's Date: 01/24/2022 ? ? ?History of Present Illness 36 y/o male presented to ED on 01/19/22 for ETOH withdrawals and homicidal ideations. IVC on admission, rescinded on 3/17. Recently admitted 2/22-2/28/23 for ETOH withdrawals but left AMA. PMH: Polysubstance abuse, HIV untreated, macrocytic anemia, transaminitis, thrombocytopenia, seizures  ? ?Clinical Impression ?  ?Pt admitted for concerns listed above. PTA pt reported independence with all ADL's and IADL's. At this time, pt presenting with mild tremors and weakness, however he is able to complete ADL's and functional mobility with no assist. Pt has good standing balance and functional mobility with no further OT concerns. Acute OT will sign off.   ?   ? ?Recommendations for follow up therapy are one component of a multi-disciplinary discharge planning process, led by the attending physician.  Recommendations may be updated based on patient status, additional functional criteria and insurance authorization.  ? ?Follow Up Recommendations ? No OT follow up  ?  ?Assistance Recommended at Discharge None  ?Patient can return home with the following   ? ?  ?Functional Status Assessment ? Patient has had a recent decline in their functional status and demonstrates the ability to make significant improvements in function in a reasonable and predictable amount of time.  ?Equipment Recommendations ? None recommended by OT  ?  ?Recommendations for Other Services   ? ? ?  ?Precautions / Restrictions Precautions ?Precautions: Fall ?Precaution Comments: CIWA precautions ?Restrictions ?Weight Bearing Restrictions: No  ? ?  ? ?Mobility Bed Mobility ?Overal bed mobility: Independent ?  ?  ?  ?  ?  ?  ?  ?  ? ?Transfers ?Overall transfer level: Modified independent ?Equipment used: None ?  ?  ?  ?  ?  ?  ?  ?General transfer comment: Increased time due to tremor ?  ? ?   ?Balance Overall balance assessment: Mild deficits observed, not formally tested ?  ?  ?  ?  ?  ?  ?  ?  ?  ?  ?  ?  ?  ?  ?  ?  ?  ?  ?   ? ?ADL either performed or assessed with clinical judgement  ? ?ADL Overall ADL's : Modified independent ?  ?  ?  ?  ?  ?  ?  ?  ?  ?  ?  ?  ?  ?  ?  ?  ?  ?  ?  ?General ADL Comments: Pt able to complete all ADL's with no assist at this time, mobilizing well with no AD  ? ? ? ?Vision Baseline Vision/History: 0 No visual deficits ?Ability to See in Adequate Light: 0 Adequate ?Patient Visual Report: No change from baseline ?Vision Assessment?: No apparent visual deficits  ?   ?Perception   ?  ?Praxis   ?  ? ?Pertinent Vitals/Pain Pain Assessment ?Pain Assessment: No/denies pain  ? ? ? ?Hand Dominance Right ?  ?Extremity/Trunk Assessment Upper Extremity Assessment ?Upper Extremity Assessment: Generalized weakness (tremors present) ?  ?Lower Extremity Assessment ?Lower Extremity Assessment: Generalized weakness (tremors present in standing) ?  ?Cervical / Trunk Assessment ?Cervical / Trunk Assessment: Normal ?  ?Communication Communication ?Communication: No difficulties ?  ?Cognition Arousal/Alertness: Awake/alert ?Behavior During Therapy: [facility] for tasks assessed/performed ?Overall Cognitive Status: Within Functional Limits for tasks assessed ?  ?  ?  ?  ?  ?  ?  ?  ?  ?  ?  ?  ?  ?  ?  ?  ?  ?  ?  ?  General Comments  VSS on RA ? ?  ?Exercises   ?  ?Shoulder Instructions    ? ? ?Home Living Family/patient expects to be discharged to:: Shelter/Homeless ?  ?  ?  ?  ?  ?  ?  ?  ?  ?  ?  ?  ?  ?  ?  ?  ?Additional Comments: Pt says he sleeps on the ground and utilizes [facility] resources for bathing and clothes washing when able. ?  ? ?  ?Prior Functioning/Environment Prior Level of Function : Independent/Modified Independent ?  ?  ?  ?  ?  ?  ?  ?  ?  ? ?  ?  ?OT Problem List: Decreased strength;Decreased activity tolerance;Impaired balance (sitting and/or standing);Decreased coordination ?   ?   ?OT Treatment/Interventions:    ?  ?OT Goals(Current goals can be found in the care plan section) Acute Rehab OT Goals ?Patient Stated Goal: To go home ?OT Goal Formulation: With patient ?Time For Goal Achievement: 01/24/22 ?Potential to Achieve Goals: Good  ?OT Frequency:   ?  ? ?Co-evaluation   ?  ?  ?  ?  ? ?  ?AM-PAC OT "6 Clicks" Daily Activity     ?Outcome Measure Help from another person eating meals?: None ?Help from another person taking care of personal grooming?: None ?Help from another person toileting, which includes using toliet, bedpan, or urinal?: None ?Help from another person bathing (including washing, rinsing, drying)?: None ?Help from another person to put on and taking off regular upper body clothing?: None ?Help from another person to put on and taking off regular lower body clothing?: None ?6 Click Score: 24 ?  ?End of Session Nurse Communication: Mobility status ? ?Activity Tolerance: Patient tolerated treatment well ?Patient left: in bed;with call bell/phone within reach ? ?OT Visit Diagnosis: Unsteadiness on feet (R26.81);Other abnormalities of gait and mobility (R26.89);Muscle weakness (generalized) (M62.81)  ?              ?Time: 7829-5621 ?OT Time Calculation (min): 12 min ?Charges:  OT General Charges ?$OT Visit: 1 Visit ?OT Evaluation ?$OT Eval Low Complexity: 1 Low ? ?Elan Brainerd H., OTR/L ?Acute Rehabilitation ? ?Hyrum Shaneyfelt Elane Bing Plume ?01/24/2022, 11:51 AM ?

## 2022-01-24 NOTE — Consult Note (Signed)
[name] Psychiatry Followup Face-to-Face Psychiatric Evaluation ? ? ?Service Date: January 24, 2022 ?LOS:  LOS: 4 days  ? ? ?Assessment  ?Gary [name] is a 36 y.o. male admitted medically for 01/19/2022 11:06 PM for Etoh Withdrawal hallucinosis. He carries the psychiatric diagnoses of Etoh Use disorder and has a past medical history of  Polysubstance abuse, HIV untreated, macrocytic anemia, transaminitis, thrombocytopenia.Psychiatry was consulted for SI by Joycelyn Das, MD.  ?  ?  ?His current presentation of tremor, decreased sleep, irritability, tachycardia, requiring Ativan for symptom relief is most consistent with Etoh Withdrawal. He meets criteria for [facility] use disorder, severe based on his history.  Patient has not current OP psychotropic medications.On initial examination, patient endorses passive SI, communicates a want to be sober, but also endorses multiple reasons he would not like to try rehab or medication assistance to continue sobriety after detox. Please see plan below for detailed recommendations.  ?  ? ?3/17: Patient endorsed anxiety and although he appeared to believe he needs benzodiazepines he was able to compromise with the use of low dose FGA. Patient appears to have improving judgement but still has poorly fair insight.  ? ?3/18: Patient continues to report anxiety despite thorazine 10 mg BID- is amenable to increasing to 10 mg TID. Patient denies all alcohol withdrawal sx on assessment; most recent CIWA 4 from earlier this morning.  Overall judgement is fair at this time. Will follow up on Monday.  ? ?Diagnoses:  ?Active Hospital problems: ?Principal Problem: ?  Alcohol withdrawal hallucinosis (HCC) alcohol use disorder, severe, dependence alcohol-induced mood disorder ?Active Problems: ?  HIV disease (HCC) ?  Polysubstance abuse (HCC) ?  Cigarette smoker ?  Alcohol use disorder, severe, dependence (HCC) ?  Transaminitis ?  Substance induced mood disorder (HCC) ?  Suicidal  ideation ?  Macrocytosis ?  Chest pain ?  Involuntary commitment secondary to suicidal ideation ?  Hypokalemia ?  ? ? ?Plan  ?## Safety and Observation Level:  ?- Based on my clinical evaluation, I estimate the patient to be at low risk of self harm in the current setting ?- At this time, we recommend a change to routine level of observation. This decision is based on my review of the chart including patient's history and current presentation, interview of the patient, mental status examination, and consideration of suicide risk including evaluating suicidal ideation, plan, intent, suicidal or self-harm behaviors, risk factors, and protective factors. This judgment is based on our ability to directly address suicide risk, implement suicide prevention strategies and develop a safety plan while the patient is in the clinical setting. Please contact our team if there is a concern that risk level has changed. ?  ?  ?## Medications:  ?-- Trazodone 50mg  QHS, insomnia ?- Increase Thorazine 10mg  TID, anxiety ? -consider increasing dose/frequency if receives PRNS ?- Start Thorazine 10mg  BID PRN for anxiety ?  ?## Medical Decision Making Capacity:  ?-- Not formally assessed ?  ?## Further Work-up:  ?-- Recommend TSH ?  ?  ?-- most recent EKG on 3/14 had QtC of 457 ?-- Pertinent labwork reviewed earlier this admission includes: Trp (-), AST 77 and ALT 79 , Last CD4 count 215 11/07/2021, EtoH- 323, MCV- 101.2 ?  ?## Disposition:  ?-- Per primary ?  ?## Behavioral / Environmental:  ?-- Ativan PRN for CIWA> 5, verbal redirection ?  ?##Legal Status ?-- IVC rescinded ?  ?Thank you for this consult request. Recommendations have been communicated to  the primary team.  We will continue to follow at this time.  ?  ?  ? ? ?Estella Husk, MD ?Attending psychiatrist ? ?followup history  ?Relevant Aspects of Hospital Course:  ?Admitted on 01/19/2022 for [facility] withdrawal, hallucinosis. ? ?Patient Report:  ?On assessment today, patient  is found sitting up in bed in NAD. He is calm, cooperative and pleasant. He describes his mood as "good" although continues to report anxiety despite scheduled thorazine. He rates his anxiety as 8/10 (10 being the worst) and describes experiencing anxiety in the context of sobriety as well in the context of alcohol detox; however, reports that anxiety during detox is generally worse. He denies SI/HI/AVH. Patient reports sleeping poorly last night and estimates that he slept approximately 2 hours and attributes this to alcohol detox; describes poor sleep as "normal" during periods of detox. Patient denies all alcohol withdrawal sx today and states "I feel like I am perfectly normal". Patient states that per his last discussion with psychiatry team that he is aware that he will not be prescribed benzodiazepines; however, is open to increasing thorazine dose. Per chart review, he has not received PRN thorazine; however, patient states "I was just about to ask my nurse". Patient has questions surrounding discharge planning- discussed with patient that primary team makes decision re: discharge planning and encouraged him to discuss with primary team. Patient verbalized understanding and was in agreement. ? ? ?Collateral information:  ?Defer ?  ?Psychiatric History:  ?Information collected from EMR ?Alcohol abuse, tobacco abuse, polysubstance abuse ?1 inpatient psychiatric admission 07/26/2021 discharged on doxepin milligrams at bedtime for insomnia and nausea; and gabapentin 300 mg 3 times daily for alcohol withdrawal syndrome ?  ?Patient has multiple inpatient admissions for alcohol detoxification, he is going to multiple alcohol rehabilitation programs ?Family psych history:  Brother- Bipolar Disorder and EtOH abuse ?Mother and Father- Lynden Ang use disorder, remission ?  ?  ?Social History:  ?Homeless ?  ?Tobacco use: "Vapes nicotine "every once in a while" ?Alcohol use: Started at 36 yo, last week drank "2/5th vodka  daily." ?Drug use: Denies ?  ? ?Family History:  ? ?The patient's family history includes Alcohol abuse in his father and mother. ? ?Medical History: ?Past Medical History:  ?Diagnosis Date  ? Alcohol abuse   ? HIV (human immunodeficiency virus infection) (HCC)   ? Seizures (HCC)   ? Subdural hematoma   ? ? ?Surgical History: ?Past Surgical History:  ?Procedure Laterality Date  ? INCISION AND DRAINAGE PERIRECTAL ABSCESS N/A 09/24/2016  ? Procedure: IRRIGATION AND DEBRIDEMENT PERIRECTAL ABSCESS;  Surgeon: Ricarda Frame, MD;  Location: [facility] ORS;  Service: General;  Laterality: N/A;  ? none    ? ? ?Medications:  ? ?Current Facility-Administered Medications:  ?  acetaminophen (TYLENOL) tablet 650 mg, 650 mg, Oral, Q6H PRN, 650 mg at 01/20/22 1529 **OR** acetaminophen (TYLENOL) suppository 650 mg, 650 mg, Rectal, Q6H PRN, Katrinka Blazing, Rondell A, MD ?  albuterol (PROVENTIL) (2.5 MG/3ML) 0.083% nebulizer solution 2.5 mg, 2.5 mg, Nebulization, Q6H PRN, Katrinka Blazing, Rondell A, MD ?  alum & mag hydroxide-simeth (MAALOX/MYLANTA) 200-200-20 MG/5ML suspension 30 mL, 30 mL, Oral, Q6H PRN **AND** lidocaine (XYLOCAINE) 2 % viscous mouth solution 15 mL, 15 mL, Oral, Q6H PRN, Smith, Rondell A, MD ?  bictegravir-emtricitabine-tenofovir AF (BIKTARVY) 50-200-25 MG per tablet 1 tablet, 1 tablet, Oral, Daily, Pahwani, Rinka R, MD ?  chlorproMAZINE (THORAZINE) tablet 10 mg, 10 mg, Oral, BID, Cinderella, Margaret A, 10 mg at 01/24/22 0935 ?  chlorproMAZINE (  THORAZINE) tablet 10 mg, 10 mg, Oral, BID PRN, Cinderella, Margaret A ?  enoxaparin (LOVENOX) injection 40 mg, 40 mg, Subcutaneous, Q24H, Smith, Rondell A, MD, 40 mg at 01/23/22 0900 ?  folic acid (FOLVITE) tablet 1 mg, 1 mg, Oral, Daily, Katrinka BlazingSmith, Rondell A, MD, 1 mg at 01/24/22 0934 ?  gabapentin (NEURONTIN) capsule 300 mg, 300 mg, Oral, TID, Katrinka BlazingSmith, Rondell A, MD, 300 mg at 01/24/22 [id number] ?  hydrALAZINE (APRESOLINE) injection 10 mg, 10 mg, Intravenous, Q4H PRN, Katrinka BlazingSmith, Rondell A, MD ?  ketorolac  (TORADOL) 30 MG/ML injection 30 mg, 30 mg, Intravenous, Q6H PRN, Pokhrel, Laxman, MD ?  LORazepam (ATIVAN) injection 0-4 mg, 0-4 mg, Intravenous, Q12H **OR** LORazepam (ATIVAN) tablet 0-4 mg, 0-4 mg, Oral, Q12H, Lisbeth PlyFaulk

## 2022-01-24 NOTE — Progress Notes (Signed)
PT Cancellation Note ? ?Patient Details ?Name: Gary [name] ?MRN: [id number] ?DOB: October 23, 1986 ? ? ?Cancelled Treatment:    Reason Eval/Treat Not Completed: PT screened, no needs identified, will sign off Per OT, patient independent within room with no balance deficits. No PT needs identified. Will sign off.  ? ?[name] A. Dan Humphreys, PT, DPT ?Acute Rehabilitation Services ?Pager [phone] ?Office [phone] ? ? ? ?Daryl Beehler A French [name] ?01/24/2022, 11:51 AM ?

## 2022-01-24 NOTE — Progress Notes (Signed)
Ok to check HIV VL and CD4 to see if pt will need PJP prophylaxis.  ? ?Ulyses Southward, PharmD, BCIDP, AAHIVP, CPP ?Infectious Disease Pharmacist ?01/24/2022 10:02 AM ? ? ?

## 2022-01-24 NOTE — Progress Notes (Signed)
?PROGRESS NOTE ? ? ? ?Gary [name]  TSV:[id number] DOB: 12/05/1985 DOA: 01/19/2022 ?PCP: Pcp, No ? ? ?Brief Narrative:  ?Gary [name] is a 36 y.o. male with medical history significant of HIV not on treatment, polysubstance abuse (including but not limited to alcohol, tobacco, methamphetamines, marijuana), SDH, seizures, alcohol withdrawal with auditory/visual hallucinations, and suicidal ideations presented to the hospital with auditory hallucinations blurry vision.  Patient was recently hospitalized from 2/22-2/28 with alcohol withdrawals requiring placement on a Precedex drip in the ICU, but ultimately left AGAINST MEDICAL ADVICE.  Since leaving the hospital,patient reports that he has been drinking 2 fifths of liquor per day, and his last drink was yesterday at around noon time on the day of presentation.  In the ED, patient complained of chest pain shortness of breath and nausea and tingling and numbness in the feet.  His initial vitals were notable for mild tachycardia and tachypnea.  LFTs showed AST 77, ALT 79, alcohol level 323, and high-sensitivity troponin 4.  Acetaminophen and salicylate levels were undetectable.  UDS was negative.  Influenza and COVID-19 screening were negative.  Chest x-ray did not show any no acute abnormalities.  Patient was given 1 L of normal saline IV fluids and started on CIWA protocol.  Initial CIWA was 24.  Patient was involuntarily committed due to his reports of suicidal ideations.  ? ?Assessment & Plan: ?  ?Alcohol withdrawal hallucinosis (HCC)  ?-History of needing Precedex/phenobarbital for severe alcohol withdrawal.  ?- We will continue to monitor closely.  ?- Continue CIWA protocol.   ?-Continue IV thiamine, p.o. folate, and MVI. ?-Most recent CIWA: 4 ?-On fall/seizure precautions. ?-Consult PT/OT-recommend no follow-up needed ?  ?suicidal ideation ?-Patient had initially expressed his thoughts of wanting to drink himself to death.  Continue one-to-one sitter.    ?-Psychiatry consulted  ?-On trazodone and Thorazine. ?-3/18: Seen by psychiatry-increase the dose of Thorazine from 10 mg twice daily to 10 mg 3 times daily.  ?-Appreciate psychiatry evaluation.  Patient will be evaluated by psychiatry on Monday. ? ?Chest pain ?-EKG/ troponin negative.  Continue Protonix.  GI cocktail.  No further chest pain today. ?  ?Transaminitis ?-Chronic.  AST 77 and ALT 79 on presentation..  Secondary to patient's history of alcohol abuse.  AST: WNL.  ALT: 46.  Continue to monitor. ?  ?HIV disease (HCC) ?-Last CD4 count was 215 on 11/07/2021.  ?-Check HIV viral load and CD4 count and will restart Biktarvy. ?-Need to follow-up with ID outpatient ? ?Hypokalemia ?-Resolved ? ?Macrocytosis ?-Chronic.  Thought to secondary to alcohol usage ?  ?Cigarette smoker ?-Current smoker.  Counseling was done ?  ?Polysubstance abuse (HCC) ?-Urine drug screen this time was negative except for benzos.   ?-Recommend cessation ? ?Thrombocytopenia: Platelet 138.  No signs of active bleeding.  Continue to monitor. ?  ? ?DVT prophylaxis: Lovenox ?Code Status: Full code ?Family Communication:  None present at bedside.  Plan of care discussed with patient in length and he verbalized understanding and agreed with it. ?Disposition Plan: To be determined ? ?Consultants:  ?Psychiatry ? ?Procedures:  ?None ? ?Antimicrobials:  ?None ? ?Status is: Inpatient ? ? ?Subjective: ?Patient seen and examined.  Sitting comfortably on the bed.  Reports that he still feels some anxious however overall improved.  No depressed mood, suicidal or homicidal thoughts.  Has been eating fine.  No acute events overnight. ? ?Objective: ?Vitals:  ? 01/24/22 0400 01/24/22 0813 01/24/22 0900 01/24/22 1213  ?BP: 101/65 109/69  99/74  ?  Pulse: 72 69  88  ?Resp: 20 17  17   ?Temp:  97.9 ?F (36.6 ?C)  98.3 ?F (36.8 ?C)  ?TempSrc:  Oral  Oral  ?SpO2: 93% 98%  97%  ?Weight:   68.5 kg   ? ?No intake or output data in the 24 hours ending 01/24/22  1226 ? ?Filed Weights  ? 01/24/22 0900  ?Weight: 68.5 kg  ? ? ?Examination: ? ?General exam: Appears calm and comfortable.  On room air, eating breakfast. ?Respiratory system: Clear to auscultation. Respiratory effort normal. ?Cardiovascular system: S1 & S2 heard, RRR. No JVD, murmurs, rubs, gallops or clicks. No pedal edema. ?Gastrointestinal system: Abdomen is nondistended, soft and nontender. No organomegaly or masses felt. Normal bowel sounds heard. ?Central nervous system: Alert and oriented. No focal neurological deficits. ?Skin: No rashes, lesions or ulcers ?Psychiatry: Appears anxious ? ?Data Reviewed: I have personally reviewed following labs and imaging studies ? ?CBC: ?Recent Labs  ?Lab 01/19/22 ?2335 01/22/22 ?1008 01/23/22 ?0240 01/24/22 ?0113  ?WBC 6.1 14.4* 4.7 3.6*  ?NEUTROABS 4.2  --   --   --   ?HGB 14.4 8.6* 13.4 13.1  ?HCT 42.7 26.3* 39.9 38.5*  ?MCV 101.2* 90.7 102.0* 102.1*  ?PLT 261 413* 163 138*  ? ? ?Basic Metabolic Panel: ?Recent Labs  ?Lab 01/19/22 ?2335 01/20/22 ?[id number] 01/22/22 ?1008 01/23/22 ?0240 01/24/22 ?0113  ?NA 140  --  139 135 135  ?K 3.8  --  3.1* 4.2 4.3  ?CL 103  --  105 102 102  ?CO2 23  --  24 26 26   ?GLUCOSE 109*  --  96 105* 118*  ?BUN 6  --  7 12 13   ?CREATININE 0.75  --  0.76 0.81 0.74  ?CALCIUM 9.2  --  8.9 9.2 9.6  ?MG  --  1.8 2.1 2.1 2.1  ?PHOS  --  4.5  --   --   --   ? ? ?GFR: ?Estimated Creatinine Clearance: 124.9 mL/min (by C-G formula based on SCr of 0.74 mg/dL). ?Liver Function Tests: ?Recent Labs  ?Lab 01/19/22 ?2335 01/23/22 ?0240 01/24/22 ?0113  ?AST 77* 40 32  ?ALT 79* 48* 46*  ?ALKPHOS 61 41 40  ?BILITOT 0.4 0.5 0.4  ?PROT 7.2 6.7 6.8  ?ALBUMIN 3.9 3.6 3.6  ? ? ?No results for input(s): LIPASE, AMYLASE in the last 168 hours. ?No results for input(s): AMMONIA in the last 168 hours. ?Coagulation Profile: ?No results for input(s): INR, PROTIME in the last 168 hours. ?Cardiac Enzymes: ?No results for input(s): CKTOTAL, CKMB, CKMBINDEX, TROPONINI in the last 168  hours. ?BNP (last 3 results) ?No results for input(s): PROBNP in the last 8760 hours. ?HbA1C: ?No results for input(s): HGBA1C in the last 72 hours. ?CBG: ?No results for input(s): GLUCAP in the last 168 hours. ?Lipid Profile: ?No results for input(s): CHOL, HDL, LDLCALC, TRIG, CHOLHDL, LDLDIRECT in the last 72 hours. ?Thyroid Function Tests: ?No results for input(s): TSH, T4TOTAL, FREET4, T3FREE, THYROIDAB in the last 72 hours. ?Anemia Panel: ?No results for input(s): VITAMINB12, FOLATE, FERRITIN, TIBC, IRON, RETICCTPCT in the last 72 hours. ?Sepsis Labs: ?No results for input(s): PROCALCITON, LATICACIDVEN in the last 168 hours. ? ?Recent Results (from the past 240 hour(s))  ?Resp Panel by RT-PCR (Flu A&B, Covid) Nasopharyngeal Swab     Status: None  ? Collection Time: 01/19/22 11:47 PM  ? Specimen: Nasopharyngeal Swab; Nasopharyngeal(NP) swabs in vial transport medium  ?Result Value Ref Range Status  ? SARS Coronavirus 2 by RT PCR  NEGATIVE NEGATIVE Final  ?  Comment: (NOTE) ?SARS-CoV-2 target nucleic acids are NOT DETECTED. ? ?The SARS-CoV-2 RNA is generally detectable in upper respiratory ?specimens during the acute phase of infection. The lowest ?concentration of SARS-CoV-2 viral copies this assay can detect is ?138 copies/mL. A negative result does not preclude SARS-Cov-2 ?infection and should not be used as the sole basis for treatment or ?other patient management decisions. A negative result may occur with  ?improper specimen collection/handling, submission of specimen other ?than nasopharyngeal swab, presence of viral mutation(s) within the ?areas targeted by this assay, and inadequate number of viral ?copies(<138 copies/mL). A negative result must be combined with ?clinical observations, patient history, and epidemiological ?information. The expected result is Negative. ? ?Fact Sheet for Patients:  ?BloggerCourse.com ? ?Fact Sheet for Healthcare Providers:   ?SeriousBroker.it ? ?This test is no t yet approved or cleared by the [place] FDA and  ?has been authorized for detection and/or diagnosis of SARS-CoV-2 by ?FDA under an Emergency Use Authorization (EUA). This

## 2022-01-25 LAB — CBC
HCT: 37.8 % — ABNORMAL LOW (ref 39.0–52.0)
Hemoglobin: 13.1 g/dL (ref 13.0–17.0)
MCH: 35 pg — ABNORMAL HIGH (ref 26.0–34.0)
MCHC: 34.7 g/dL (ref 30.0–36.0)
MCV: 101.1 fL — ABNORMAL HIGH (ref 80.0–100.0)
Platelets: 134 10*3/uL — ABNORMAL LOW (ref 150–400)
RBC: 3.74 MIL/uL — ABNORMAL LOW (ref 4.22–5.81)
RDW: 13.1 % (ref 11.5–15.5)
WBC: 4.9 10*3/uL (ref 4.0–10.5)
nRBC: 0 % (ref 0.0–0.2)

## 2022-01-25 LAB — COMPREHENSIVE METABOLIC PANEL
ALT: 40 U/L (ref 0–44)
AST: 26 U/L (ref 15–41)
Albumin: 3.5 g/dL (ref 3.5–5.0)
Alkaline Phosphatase: 40 U/L (ref 38–126)
Anion gap: 7 (ref 5–15)
BUN: 13 mg/dL (ref 6–20)
CO2: 25 mmol/L (ref 22–32)
Calcium: 9.2 mg/dL (ref 8.9–10.3)
Chloride: 102 mmol/L (ref 98–111)
Creatinine, Ser: 0.79 mg/dL (ref 0.61–1.24)
GFR, Estimated: >60 mL/min (ref >60)
Glucose, Bld: 106 mg/dL — ABNORMAL HIGH (ref 70–99)
Potassium: 3.9 mmol/L (ref 3.5–5.1)
Sodium: 134 mmol/L — ABNORMAL LOW (ref 135–145)
Total Bilirubin: 0.4 mg/dL (ref 0.3–1.2)
Total Protein: 6.5 g/dL (ref 6.5–8.1)

## 2022-01-25 LAB — MAGNESIUM: Magnesium: 1.9 mg/dL (ref 1.7–2.4)

## 2022-01-25 LAB — PHOSPHORUS: Phosphorus: 5 mg/dL — ABNORMAL HIGH (ref 2.5–4.6)

## 2022-01-25 LAB — GLUCOSE, CAPILLARY: Glucose-Capillary: 112 mg/dL — ABNORMAL HIGH (ref 70–99)

## 2022-01-25 MED ORDER — LORAZEPAM 2 MG/ML IJ SOLN: 1.0000 mg | INTRAMUSCULAR | Status: DC | PRN

## 2022-01-25 MED ORDER — LORAZEPAM 2 MG/ML IJ SOLN: 0.5000 mg | INTRAMUSCULAR | Status: DC | PRN

## 2022-01-25 MED ORDER — LORAZEPAM 1 MG PO TABS
1.0000 mg | ORAL_TABLET | ORAL | Status: DC | PRN
  Administered 2022-01-26: 1 mg via ORAL

## 2022-01-25 NOTE — Significant Event (Signed)
Rapid Response Event Note  ? ?Reason for Call :  ?Seizure like activity ? ?Initial Focused Assessment:  ?NT was outside of the patients room and heard him call out. When she got to the room he had generalized shaking and he had his eyes rolled back in his head. The charge nurse reports that when she saw him that he appeared to be slowly coming to and grasping at the air. When I arrived to the patient's room he was alert and talking to staff. Patient did not loose control of his bladder or bowel. There was no evidence of bleeding tongue or check.  ? ?EEG ordered for patient.   ? ?BP 111/80 ?HR 77 ?O2 100 ?RR 18 ? ? ? ?Event Summary:  ? ?MD Notified: Dr. Jacqulyn Bath ?Call Time: 1830 ?Arrival Time: 44 ?End Time:1900 ? ?Andrey Spearman, RN ?

## 2022-01-25 NOTE — Progress Notes (Signed)
Rn called to room by NT, when Rn entered room pt found to be having seizure like activity, around 3 min. Pts mouth suctioned and NRB applied, pt not on telemetry, telemetry applied pt  in Sinus tach with HR 113, BP 139/77 Per NT she heard scream from pts room and when she entered she saw pt with seizure like activity. No PRN's available. Dr. Jacqulyn Bath paged, and Rapid response RN notified. See new orders  ?

## 2022-01-25 NOTE — Progress Notes (Signed)
Pt alert and oriented x3, attempting to remove telemetry, Rn explained to pt that he needs to keep his heart monitor on for now considering he just had a seizure. Pt stated he needed to leave and he had places to be. Rn explained to pt that it would be for the best if he would stay tonight so he can be monitored, pt in agreement at this time. Report given to night shift RN  ?

## 2022-01-25 NOTE — Progress Notes (Signed)
Pt has slept well all shift. Received Thorazine at scheduled time which was HS. No distress all shift. VSS ?

## 2022-01-25 NOTE — Plan of Care (Signed)
Received message from RN re: patient's anxiety who reports that primary team directed her to psychiatry team with concerns.. Patient had reported to RN that thorazine has not been controlling his anxiety well. Per previous documentation by [facility] psychiatry team, patient is focused on obtaining benzodiazepines for his anxiety. Today is the first day that he will be receiving all scheduled doses of TID thorazine 10 mg. Recommend to RN to also utilize hydroxyzine PRN for anxiety. Psychiatry team to follow up tomorrow with further recommendations.   ? ?Estella Husk, MD ? ?

## 2022-01-25 NOTE — Progress Notes (Addendum)
?PROGRESS NOTE ? ? ? ?Gary [name]  HAL:[id number] DOB: 03/22/1986 DOA: 01/19/2022 ?PCP: Pcp, No ? ? ?Brief Narrative:  ?MITHRAN STRIKE is a 36 y.o. male with medical history significant of HIV not on treatment, polysubstance abuse (including but not limited to alcohol, tobacco, methamphetamines, marijuana), SDH, seizures, alcohol withdrawal with auditory/visual hallucinations, and suicidal ideations presented to the hospital with auditory hallucinations blurry vision.  Patient was recently hospitalized from 2/22-2/28 with alcohol withdrawals requiring placement on a Precedex drip in the ICU, but ultimately left AGAINST MEDICAL ADVICE.  Since leaving the hospital,patient reports that he has been drinking 2 fifths of liquor per day, and his last drink was yesterday at around noon time on the day of presentation.  In the ED, patient complained of chest pain shortness of breath and nausea and tingling and numbness in the feet.  His initial vitals were notable for mild tachycardia and tachypnea.  LFTs showed AST 77, ALT 79, alcohol level 323, and high-sensitivity troponin 4.  Acetaminophen and salicylate levels were undetectable.  UDS was negative.  Influenza and COVID-19 screening were negative.  Chest x-ray did not show any no acute abnormalities.  Patient was given 1 L of normal saline IV fluids and started on CIWA protocol.  Initial CIWA was 24.  Patient was involuntarily committed due to his reports of suicidal ideations.  ? ?Assessment & Plan: ?  ?Alcohol withdrawal hallucinosis (HCC)  ?-History of needing Precedex/phenobarbital for severe alcohol withdrawal.  ?- We will continue to monitor closely.  ?- Continue CIWA protocol.   ?-Continue IV thiamine, p.o. folate, and MVI. ?-Most recent CIWA: 4 ?-On fall/seizure precautions. ?-Consult PT/OT-recommend no follow-up needed ?  ?suicidal ideation ?Generalized anxiety disorder ?-Patient had initially expressed his thoughts of wanting to drink himself to death.   ?-Psychiatry consulted  ?-On trazodone and Thorazine. ?-3/18: Seen by psychiatry-increase the dose of Thorazine from 10 mg twice daily to 10 mg 3 times daily.  ?-Appreciate psychiatry evaluation.  Patient will be evaluated by psychiatry on Monday. ? ?Chest pain ?-EKG/ troponin negative.  Continue Protonix.   No further chest pain. ?  ?Transaminitis ?- AST 77 and ALT 79 on presentation..  Secondary to patient's history of alcohol abuse.  ?-LFT within normal limits. ? ?HIV disease (HCC) ?-Last CD4 count was 215 on 11/07/2021.  ?-Check HIV viral load and CD4 count: Pending  ?-Continue Biktarvy. ?-Need to follow-up with ID outpatient ? ?Hypokalemia ?-Resolved.  Magnesium: WNL ? ?Macrocytosis ?-Chronic.  Thought to secondary to alcohol usage ?-H&H is stable ?  ?Cigarette smoker ?-Current smoker.  Counseling was done ?  ?Polysubstance abuse (HCC) ?-Urine drug screen this time was negative except for benzos.   ?-Recommend cessation ? ?Thrombocytopenia: Platelet 134.  No signs of active bleeding.  Continue to monitor. ?  ?ADDENDUM: 6;35 pm: Notified by the nurse that patient had seizure for 2-3 minutes. Ativan PRN ordered. EEG ordered. I discussed with oncall neurology Dr. Amada Jupiter who will see the patient.  ? ?DVT prophylaxis: Lovenox ?Code Status: Full code ?Family Communication:  None present at bedside.  Plan of care discussed with patient in length and he verbalized understanding and agreed with it. ?Disposition Plan: To be determined ? ?Consultants:  ?Psychiatry ? ?Procedures:  ?None ? ?Antimicrobials:  ?None ? ?Status is: Inpatient ? ? ?Subjective: ?Patient seen and examined.  Reports that he continues to feel anxious and thinks that Thorazine is not helpful.  He denies further suicidal ideation.  Overall feels better.  No acute  events overnight. ? ?Last CIWA score: 4 ? ?Objective: ?Vitals:  ? 01/24/22 2353 01/25/22 0431 01/25/22 0810 01/25/22 1215  ?BP: 105/66 99/61 103/73 97/68  ?Pulse: 79 69 64 72  ?Resp: 16  19 17 17   ?Temp: 98.4 ?F (36.9 ?C) 98.4 ?F (36.9 ?C) 98.1 ?F (36.7 ?C) 98.5 ?F (36.9 ?C)  ?TempSrc: Oral Oral Oral Oral  ?SpO2: 95% 95% 96% 97%  ?Weight:      ? ? ?Intake/Output Summary (Last 24 hours) at 01/25/2022 1357 ?Last data filed at 01/24/2022 1800 ?Gross per 24 hour  ?Intake 360 ml  ?Output --  ?Net 360 ml  ? ? ?Filed Weights  ? 01/24/22 0900  ?Weight: 68.5 kg  ? ? ?Examination: ? ?General exam: Appears calm and comfortable.  On room air, eating breakfast. ?Respiratory system: Clear to auscultation. Respiratory effort normal. ?Cardiovascular system: S1 & S2 heard, RRR. No JVD, murmurs, rubs, gallops or clicks. No pedal edema. ?Gastrointestinal system: Abdomen is nondistended, soft and nontender. No organomegaly or masses felt. Normal bowel sounds heard. ?Central nervous system: Alert and oriented. No focal neurological deficits. ?Skin: No rashes, lesions or ulcers ?Psychiatry: Appears anxious ? ?Data Reviewed: I have personally reviewed following labs and imaging studies ? ?CBC: ?Recent Labs  ?Lab 01/19/22 ?2335 01/22/22 ?1008 01/23/22 ?0240 01/24/22 ?0113 01/25/22 ?01/27/22  ?WBC 6.1 14.4* 4.7 3.6* 4.9  ?NEUTROABS 4.2  --   --   --   --   ?HGB 14.4 8.6* 13.4 13.1 13.1  ?HCT 42.7 26.3* 39.9 38.5* 37.8*  ?MCV 101.2* 90.7 102.0* 102.1* 101.1*  ?PLT 261 413* 163 138* 134*  ? ? ?Basic Metabolic Panel: ?Recent Labs  ?Lab 01/19/22 ?2335 01/20/22 ?01/22/22 01/22/22 ?1008 01/23/22 ?0240 01/24/22 ?0113 01/25/22 ?01/27/22  ?NA 140  --  139 135 135 134*  ?K 3.8  --  3.1* 4.2 4.3 3.9  ?CL 103  --  105 102 102 102  ?CO2 23  --  24 26 26 25   ?GLUCOSE 109*  --  96 105* 118* 106*  ?BUN 6  --  7 12 13 13   ?CREATININE 0.75  --  0.76 0.81 0.74 0.79  ?CALCIUM 9.2  --  8.9 9.2 9.6 9.2  ?MG  --  1.8 2.1 2.1 2.1 1.9  ?PHOS  --  4.5  --   --   --  5.0*  ? ? ?GFR: ?Estimated Creatinine Clearance: 124.9 mL/min (by C-G formula based on SCr of 0.79 mg/dL). ?Liver Function Tests: ?Recent Labs  ?Lab 01/19/22 ?2335 01/23/22 ?0240 01/24/22 ?0113  01/25/22 ?01/25/22  ?AST 77* 40 32 26  ?ALT 79* 48* 46* 40  ?ALKPHOS 61 41 40 40  ?BILITOT 0.4 0.5 0.4 0.4  ?PROT 7.2 6.7 6.8 6.5  ?ALBUMIN 3.9 3.6 3.6 3.5  ? ? ?No results for input(s): LIPASE, AMYLASE in the last 168 hours. ?No results for input(s): AMMONIA in the last 168 hours. ?Coagulation Profile: ?No results for input(s): INR, PROTIME in the last 168 hours. ?Cardiac Enzymes: ?No results for input(s): CKTOTAL, CKMB, CKMBINDEX, TROPONINI in the last 168 hours. ?BNP (last 3 results) ?No results for input(s): PROBNP in the last 8760 hours. ?HbA1C: ?No results for input(s): HGBA1C in the last 72 hours. ?CBG: ?No results for input(s): GLUCAP in the last 168 hours. ?Lipid Profile: ?No results for input(s): CHOL, HDL, LDLCALC, TRIG, CHOLHDL, LDLDIRECT in the last 72 hours. ?Thyroid Function Tests: ?No results for input(s): TSH, T4TOTAL, FREET4, T3FREE, THYROIDAB in the last 72 hours. ?Anemia Panel: ?No results  for input(s): VITAMINB12, FOLATE, FERRITIN, TIBC, IRON, RETICCTPCT in the last 72 hours. ?Sepsis Labs: ?No results for input(s): PROCALCITON, LATICACIDVEN in the last 168 hours. ? ?Recent Results (from the past 240 hour(s))  ?Resp Panel by RT-PCR (Flu A&B, Covid) Nasopharyngeal Swab     Status: None  ? Collection Time: 01/19/22 11:47 PM  ? Specimen: Nasopharyngeal Swab; Nasopharyngeal(NP) swabs in vial transport medium  ?Result Value Ref Range Status  ? SARS Coronavirus 2 by RT PCR NEGATIVE NEGATIVE Final  ?  Comment: (NOTE) ?SARS-CoV-2 target nucleic acids are NOT DETECTED. ? ?The SARS-CoV-2 RNA is generally detectable in upper respiratory ?specimens during the acute phase of infection. The lowest ?concentration of SARS-CoV-2 viral copies this assay can detect is ?138 copies/mL. A negative result does not preclude SARS-Cov-2 ?infection and should not be used as the sole basis for treatment or ?other patient management decisions. A negative result may occur with  ?improper specimen collection/handling, submission  of specimen other ?than nasopharyngeal swab, presence of viral mutation(s) within the ?areas targeted by this assay, and inadequate number of viral ?copies(<138 copies/mL). A negative result must be combined with ?

## 2022-01-25 NOTE — Progress Notes (Signed)
EEG phone number called with no response. Voicemail was left with nurses name and number. Awaiting response.  ?

## 2022-01-26 ENCOUNTER — Inpatient Hospital Stay (HOSPITAL_COMMUNITY): Payer: Self-pay

## 2022-01-26 DIAGNOSIS — R569 Unspecified convulsions: Secondary | ICD-10-CM

## 2022-01-26 LAB — BASIC METABOLIC PANEL
Anion gap: 7 (ref 5–15)
BUN: 14 mg/dL (ref 6–20)
CO2: 24 mmol/L (ref 22–32)
Calcium: 8.7 mg/dL — ABNORMAL LOW (ref 8.9–10.3)
Chloride: 99 mmol/L (ref 98–111)
Creatinine, Ser: 0.75 mg/dL (ref 0.61–1.24)
GFR, Estimated: >60 mL/min (ref >60)
Glucose, Bld: 102 mg/dL — ABNORMAL HIGH (ref 70–99)
Potassium: 3.6 mmol/L (ref 3.5–5.1)
Sodium: 130 mmol/L — ABNORMAL LOW (ref 135–145)

## 2022-01-26 LAB — T-HELPER CELLS (CD4) COUNT (NOT AT ARMC)
CD4 % Helper T Cell: 36 % (ref 33–65)
CD4 T Cell Abs: 309 /uL — ABNORMAL LOW (ref 400–1790)

## 2022-01-26 LAB — CBC
HCT: 35.7 % — ABNORMAL LOW (ref 39.0–52.0)
Hemoglobin: 12.2 g/dL — ABNORMAL LOW (ref 13.0–17.0)
MCH: 34.6 pg — ABNORMAL HIGH (ref 26.0–34.0)
MCHC: 34.2 g/dL (ref 30.0–36.0)
MCV: 101.1 fL — ABNORMAL HIGH (ref 80.0–100.0)
Platelets: 131 10*3/uL — ABNORMAL LOW (ref 150–400)
RBC: 3.53 MIL/uL — ABNORMAL LOW (ref 4.22–5.81)
RDW: 12.9 % (ref 11.5–15.5)
WBC: 4.1 10*3/uL (ref 4.0–10.5)
nRBC: 0 % (ref 0.0–0.2)

## 2022-01-26 LAB — HIV-1 RNA QUANT-NO REFLEX-BLD
HIV 1 RNA Quant: 2920 copies/mL
LOG10 HIV-1 RNA: 3.465 log10copy/mL

## 2022-01-26 MED ORDER — LAMOTRIGINE 25 MG PO TABS
25.0000 mg | ORAL_TABLET | Freq: Every day | ORAL | Status: DC
  Administered 2022-01-26 – 2022-01-27 (×2): 25 mg via ORAL

## 2022-01-26 MED ORDER — LAMOTRIGINE 25 MG PO TABS: 50.0000 mg | ORAL_TABLET | Freq: Every day | ORAL | Status: DC

## 2022-01-26 MED ORDER — LEVETIRACETAM IN NACL 1000 MG/100ML IV SOLN
1000.0000 mg | Freq: Once | INTRAVENOUS | Status: DC
  Filled 2022-01-26: qty 100

## 2022-01-26 MED ORDER — LAMOTRIGINE 25 MG PO TABS: 25.0000 mg | ORAL_TABLET | Freq: Two times a day (BID) | ORAL | Status: DC

## 2022-01-26 MED ORDER — LACOSAMIDE 50 MG PO TABS: 100.0000 mg | ORAL_TABLET | Freq: Two times a day (BID) | ORAL | Status: DC

## 2022-01-26 MED ORDER — LAMOTRIGINE 25 MG PO TABS: 50.0000 mg | ORAL_TABLET | Freq: Two times a day (BID) | ORAL | Status: DC

## 2022-01-26 MED ORDER — LAMOTRIGINE 25 MG PO TABS: 75.0000 mg | ORAL_TABLET | Freq: Every day | ORAL | Status: DC

## 2022-01-26 MED ORDER — LAMOTRIGINE 100 MG PO TABS: 100.0000 mg | ORAL_TABLET | Freq: Every day | ORAL | Status: DC

## 2022-01-26 MED ORDER — SODIUM CHLORIDE 0.9 % IV SOLN
200.0000 mg | Freq: Once | INTRAVENOUS | Status: AC
  Administered 2022-01-26: 200 mg via INTRAVENOUS
  Filled 2022-01-26: qty 20

## 2022-01-26 MED ORDER — LAMOTRIGINE 25 MG PO TABS: 25.0000 mg | ORAL_TABLET | Freq: Every day | ORAL | Status: DC

## 2022-01-26 MED ORDER — LAMOTRIGINE 100 MG PO TABS: 100.0000 mg | ORAL_TABLET | Freq: Two times a day (BID) | ORAL | Status: DC

## 2022-01-26 MED ORDER — LACOSAMIDE 50 MG PO TABS
100.0000 mg | ORAL_TABLET | Freq: Two times a day (BID) | ORAL | Status: DC
Start: 2022-03-16 — End: 2022-01-27

## 2022-01-26 MED ORDER — LAMOTRIGINE 25 MG PO TABS: 75.0000 mg | ORAL_TABLET | Freq: Two times a day (BID) | ORAL | Status: DC

## 2022-01-26 MED ORDER — LORAZEPAM 2 MG/ML IJ SOLN: 2.0000 mg | INTRAMUSCULAR | Status: DC | PRN

## 2022-01-26 NOTE — Consult Note (Addendum)
NEUROLOGY CONSULTATION NOTE  ? ?Date of service: January 26, 2022 ?Patient Name: Gary [name] ?MRN:  [id number] ?DOB:  [birth year] ?Reason for consult: "Seizure" ?Requesting Provider: Ollen Bowl, MD ?_ _ _   _ __   _ __ _ _  __ __   _ __   __ _ ? ?History of Present Illness  ?Gary [name] is a 36 y.o. male with PMH significant for EtOh use, HIV, prior Etoh withdrawal seizure, prior traumatic SDH in Aug 2022, polysubstance use who is admitted with auditory hallucinations, blurry vision, suicidal ideation. He has been here for 6 days, is on CIWA for alcohol withdrawal. He was noted to have seizure like activity in the evening today.  ? ? ?Per notes, "NT was outside of the patients room and heard him call out. When she got to the room he had generalized shaking and he had his eyes rolled back in his head. The charge nurse reports that when she saw him that he appeared to be slowly coming to and grasping at the air. When I arrived to the patient's room he was alert and talking to staff. Patient did not loose control of his bladder or bowel.  ? ? ?On my questioning, patient has no recollection of seizure earlier. He has tongue soreness on the left side. ? ?Endorses prior hx of seizure when he soul cut down on alcohol. Endorses several instances where he has passed out and his his head. No family hx of seizure, no seizure in childhood, no strokes, no CNS surgery, no hx of CNS infection. ?  ?ROS  ? ?Constitutional Denies weight loss, fever and chills.   ?HEENT Denies changes in vision and hearing.   ?Respiratory Denies SOB and cough.   ?CV Denies palpitations and CP   ?GI Denies abdominal pain, nausea, vomiting and diarrhea.   ?GU Denies dysuria and urinary frequency.   ?MSK Denies myalgia and joint pain.   ?Skin Denies rash and pruritus.   ?Neurological Denies headache and syncope.   ?Psychiatric Denies recent changes in mood. Denies anxiety and depression.   ? ?Past History  ? ?Past Medical History:  ?Diagnosis  Date  ? Alcohol abuse   ? HIV (human immunodeficiency virus infection) (HCC)   ? Seizures (HCC)   ? Subdural hematoma   ? ?Past Surgical History:  ?Procedure Laterality Date  ? INCISION AND DRAINAGE PERIRECTAL ABSCESS N/A 09/24/2016  ? Procedure: IRRIGATION AND DEBRIDEMENT PERIRECTAL ABSCESS;  Surgeon: Ricarda Frame, MD;  Location: [facility] ORS;  Service: General;  Laterality: N/A;  ? none    ? ?Family History  ?Problem Relation Age of Onset  ? Alcohol abuse Mother   ? Alcohol abuse Father   ? ?Social History  ? ?Socioeconomic History  ? Marital status: Unknown  ?  Spouse name: Not on file  ? Number of children: Not on file  ? Years of education: Not on file  ? Highest education level: Not on file  ?Occupational History  ? Occupation: unemployed  ?Tobacco Use  ? Smoking status: Every Day  ?  Packs/day: 0.50  ?  Types: Cigarettes  ? Smokeless tobacco: Never  ? Tobacco comments:  ?  unable to smoke while incarcerated 6+ months 02/13/20  ?Vaping Use  ? Vaping Use: Never used  ?Substance and Sexual Activity  ? Alcohol use: Yes  ?  Comment: "I drink all I can"  ? Drug use: Yes  ?  Types: Methamphetamines, Marijuana  ?  Comment: last  used 04/10/2019  ? Sexual activity: Yes  ?  Partners: Male, Male  ?  Comment: declined condoms  03/2021  ?Other Topics Concern  ? Not on file  ?Social History Narrative  ? "Currently living on the streets"  ? Independent at baseline  ? ?Social Determinants of Health  ? ?Financial Resource Strain: Not on file  ?Food Insecurity: Not on file  ?Transportation Needs: Not on file  ?Physical Activity: Not on file  ?Stress: Not on file  ?Social Connections: Not on file  ? ?Allergies  ?Allergen Reactions  ? Tegretol [Carbamazepine] Other (See Comments)  ?  Caused vertigo for 2 days after taking it  ? Caffeine Palpitations  ? ? ?Medications  ? ?Medications Prior to Admission  ?Medication Sig Dispense Refill Last Dose  ? bictegravir-emtricitabine-tenofovir AF (BIKTARVY) 50-200-25 MG TABS tablet Take 1 tablet  by mouth daily. (Patient not taking: Reported on 01/20/2022) 3 tablet 0 Not Taking  ?  ? ?Vitals  ? ?Vitals:  ? 01/25/22 1819 01/25/22 1830 01/25/22 1921 01/25/22 2310  ?BP: 139/77 113/77 107/74 105/70  ?Pulse: (!) 113 (!) 115 95 68  ?Resp:  19 16 16   ?Temp:   97.9 ?F (36.6 ?C) 99.1 ?F (37.3 ?C)  ?TempSrc:   Oral Oral  ?SpO2: 93% 95% 96% 95%  ?Weight:      ?  ? ?Body mass index is 20.48 kg/m?. ? ?Physical Exam  ? ?General: Laying comfortably in bed; in no acute distress.  ?HENT: Normal oropharynx and mucosa. Normal external appearance of ears and nose.  ?Neck: Supple, no pain or tenderness  ?CV: No JVD. No peripheral edema.  ?Pulmonary: Symmetric Chest rise. Normal respiratory effort.  ?Abdomen: Soft to touch, non-tender.  ?Ext: No cyanosis, edema, or deformity  ?Skin: No rash. Normal palpation of skin.   ?Musculoskeletal: Normal digits and nails by inspection. No clubbing.  ? ?Neurologic Examination  ?Mental status/Cognition: Alert, oriented to self, place, month and year, good attention.  ?Speech/language: Fluent, comprehension intact, object naming intact, repetition intact.  ?Cranial nerves:  ? CN II Pupils equal and reactive to light, no VF deficits   ? CN III,IV,VI EOM intact, no gaze preference or deviation, no nystagmus   ? CN V normal sensation in V1, V2, and V3 segments bilaterally   ? CN VII no asymmetry, no nasolabial fold flattening   ? CN VIII normal hearing to speech   ? CN IX & X normal palatal elevation, no uvular deviation   ? CN XI 5/5 head turn and 5/5 shoulder shrug bilaterally   ? CN XII midline tongue protrusion   ? ?Motor:  ?Muscle bulk: normal, tone normal, pronator drift none tremor none ?Mvmt Root Nerve  Muscle Right Left Comments  ?SA C5/6 Ax Deltoid 5 5   ?EF C5/6 Mc Biceps 5 5   ?EE C6/7/8 Rad Triceps 5 5   ?WF C6/7 Med FCR     ?WE C7/8 PIN ECU     ?F Ab C8/T1 U ADM/FDI 5 5   ?HF L1/2/3 Fem Illopsoas 5 5   ?KE L2/3/4 Fem Quad 5 5   ?DF L4/5 D Peron Tib Ant 5 5   ?PF S1/2 Tibial Grc/Sol 5  5   ? ?Reflexes: ? Right Left Comments  ?Pectoralis     ? Biceps (C5/6) 2 2   ?Brachioradialis (C5/6) 2 2   ? Triceps (C6/7) 2 2   ? Patellar (L3/4) 2 2   ? Achilles (S1)     ?  Hoffman     ? Plantar     ?Jaw jerk   ? ?Sensation: ? Light touch Intact throughout  ? Pin prick   ? Temperature   ? Vibration   ?Proprioception   ? ?Coordination/Complex Motor:  ?- Finger to Nose intact BL ?- Heel to shin intact BL ?- Rapid alternating movement are normal ?- Gait: Deferred. ? ?Labs  ? ?CBC:  ?Recent Labs  ?Lab 01/19/22 ?2335 01/22/22 ?1008 01/24/22 ?0113 01/25/22 ?3546  ?WBC 6.1   < > 3.6* 4.9  ?NEUTROABS 4.2  --   --   --   ?HGB 14.4   < > 13.1 13.1  ?HCT 42.7   < > 38.5* 37.8*  ?MCV 101.2*   < > 102.1* 101.1*  ?PLT 261   < > 138* 134*  ? < > = values in this interval not displayed.  ? ? ?Basic Metabolic Panel:  ?Lab Results  ?Component Value Date  ? NA 134 (L) 01/25/2022  ? K 3.9 01/25/2022  ? CO2 25 01/25/2022  ? GLUCOSE 106 (H) 01/25/2022  ? BUN 13 01/25/2022  ? CREATININE 0.79 01/25/2022  ? CALCIUM 9.2 01/25/2022  ? GFRNONAA >60 01/25/2022  ? GFRAA 124 03/20/2021  ? ?Lipid Panel:  ?Lab Results  ?Component Value Date  ? LDLCALC 104 (H) 07/26/2021  ? ?HgbA1c:  ?Lab Results  ?Component Value Date  ? HGBA1C 4.3 (L) 07/26/2021  ? ?Urine Drug Screen:  ?   ?Component Value Date/Time  ? LABOPIA NONE DETECTED 01/19/2022 2328  ? COCAINSCRNUR NONE DETECTED 01/19/2022 2328  ? COCAINSCRNUR NONE DETECTED 12/29/2020 1936  ? LABBENZ NONE DETECTED 01/19/2022 2328  ? AMPHETMU NONE DETECTED 01/19/2022 2328  ? THCU NONE DETECTED 01/19/2022 2328  ? LABBARB NONE DETECTED 01/19/2022 2328  ?  ?Alcohol Level  ?   ?Component Value Date/Time  ? ETH 323 [facility]) 01/19/2022 2335  ? ?Prior MRI Brain(Personally reviewed): ?1. No acute intracranial pathology. No evidence of Wernicke's ?encephalopathy. ?2. Scattered small foci of FLAIR signal abnormality in the ?subcortical and periventricular white matter are nonspecific but are ?advanced for age. Finding  may reflect age advanced chronic white ?matter microangiopathy, migraines, prior infection/inflammation, ?among other etiologies. ?3. Extensive mucosal thickening in the maxillary sinuses, new since

## 2022-01-26 NOTE — Procedures (Signed)
Patient Name: Gary [name]  ?MRN: [id number]  ?Epilepsy Attending: Lora Havens  ?Referring Physician/Provider: Mckinley Jewel, MD ?Date: 01/26/2022 ?Duration: 22.32 mins ? ?Patient history: 36 y.o. male with PMH significant for EtOh use, HIV, prior Etoh withdrawal seizure, prior traumatic SDH in Aug 2022, polysubstance use who is admitted with auditory hallucinations, blurry vision, suicidal ideation. He has been here for 6 days, is on CIWA for alcohol withdrawal. He was noted to have seizure like activity in the evening today, 6 days after his last EtOH use.  EEG to evaluate for seizure. ? ?Level of alertness: Awake, asleep ? ?AEDs during EEG study: LTG, LCM, GBP, Ativan ? ?Technical aspects: This EEG study was done with scalp electrodes positioned according to the 10-20 International system of electrode placement. Electrical activity was acquired at a sampling rate of 500Hz  and reviewed with a high frequency filter of 70Hz  and a low frequency filter of 1Hz . EEG data were recorded continuously and digitally stored.  ? ?Description: The posterior dominant rhythm consists of 9 Hz activity of moderate voltage (25-35 uV) seen predominantly in posterior head regions, symmetric and reactive to eye opening and eye closing. Sleep was characterized by vertex waves, sleep spindles (12 to 14 Hz), maximal frontocentral region.  Hyperventilation and photic stimulation were not performed.    ? ?IMPRESSION: ?This study is within normal limits. No seizures or epileptiform discharges were seen throughout the recording. ? ?Lora Havens  ? ?

## 2022-01-26 NOTE — Consult Note (Incomplete)
Pt states he is doing much better. Has a HA, knows he had a seizure. Has gotten Toradol. Is 8 days after last drink. No stomach pains. Slept well last night - woke up at 2AM but otherwise slept better. Is mostly back to normal diet, strength better. Is having cravings but has more insight - "I will always have cravings for alcohol". Anxiety is still present. Not as bad as last week. Mostly anticipatory - not knowing how long he will stay in or where he will stay after he gets out. He doesn't have a phone number but knows how to get in touch with them after he leaves. No thoughts of self harm, no thoughts of hurting others. No hallucinations. No thoughts that others are out to get him. Laughs appropriately with interviewer. Usually drinks to help with his anxiety. Did get 1 mg ativan at 2 AM which helped somewhat.  ?

## 2022-01-26 NOTE — Progress Notes (Signed)
EEG complete - results pending 

## 2022-01-26 NOTE — Consult Note (Addendum)
[name] Followup Face-to-Face Psychiatric Evaluation ? ? ?Service Date: January 26, 2022 ?LOS:  LOS: 6 days  ? ? ?Assessment  ?[name] is a 36 y.o. male admitted medically for 01/19/2022 11:06 PM for Etoh Withdrawal hallucinosis. He carries the psychiatric diagnoses of Etoh Use disorder and has a past medical history of  Polysubstance abuse, HIV untreated, macrocytic anemia, transaminitis, thrombocytopenia. Psychiatry was consulted for SI by Gary Das, MD.  ? ?His current presentation of tremor, decreased sleep, irritability, tachycardia, requiring Ativan for symptom relief is most consistent with Etoh Withdrawal. He meets criteria for [facility] use disorder, severe based on his history.  Patient has not current OP psychotropic medications.On initial examination, patient endorses passive SI, communicates a want to be sober, but also endorses multiple reasons he would not like to try rehab or medication assistance to continue sobriety after detox. Please see plan below for detailed recommendations.  ? ?01/26/22: On assessment patient continues to voice intentions of sobriety and denies IOP/inpatient rehab after discharge. Patient endorses anxiety has decreased since last week. He is doing well on Thorazine.  ? ?Diagnoses:  ?Active Hospital problems: ?Principal Problem: ?  Alcohol withdrawal hallucinosis (HCC) alcohol use disorder, severe, dependence alcohol-induced mood disorder ?Active Problems: ?  HIV disease (HCC) ?  Polysubstance abuse (HCC) ?  Cigarette smoker ?  Alcohol use disorder, severe, dependence (HCC) ?  Transaminitis ?  Substance induced mood disorder (HCC) ?  Suicidal ideation ?  Macrocytosis ?  Chest pain ?  Involuntary commitment secondary to suicidal ideation ?  Hypokalemia ?  ? ? ?Plan  ?## Safety and Observation Level:  ?- Based on my clinical evaluation, I estimate the patient to be at low risk of self harm in the current setting ?- At this time, we recommend a routine  level of observation. This decision is based on my review of the chart including patient's history and current presentation, interview of the patient, mental status examination, and consideration of suicide risk including evaluating suicidal ideation, plan, intent, suicidal or self-harm behaviors, risk factors, and protective factors. This judgment is based on our ability to directly address suicide risk, implement suicide prevention strategies and develop a safety plan while the patient is in the clinical setting. Please contact our team if there is a concern that risk level has changed. ? ? ?## Medications:  ?-- Continue Thorazine 10 mg TID for anxiety ?- Continue Thorazine 10 mg BID PRN for anxiety ?- Continue Trazodone 50 mg qhs for sleep ? ? ?## Medical Decision Making Capacity:  ?- Not formally assessed ? ?## Further Work-up:  ?-- Recommend TSH ? ?-- most recent EKG on 3/14 had QtC of 457 ?-- Pertinent labwork reviewed earlier this admission includes: Trp (-), AST 77 and ALT 79 , Last CD4 count 215 11/07/2021, EtoH- 323, MCV- 101.2 ? ?## Disposition:  ?-- Per primary ? ?## Behavioral / Environmental:  ?-- Ativan PRN for CIWA> 5, verbal redirection ?  ?##Legal Status ?-- IVC rescinded ? ? ?Thank you for this consult request. Recommendations have been communicated to the primary team.  We will continue to follow at this time.  ? ?[name], Medical Student ? ? ?Followup history  ?Relevant Aspects of Hospital Course:  ?Admitted on 01/19/2022 for [facility] withdrawal, hallucinosis.  ? ?Patient Report:  ?Patient reports that he slept ok last night. He had a witnessed seizure overnight. He does not remember the seizure. Reports a bad migraine last night that improved with Toradol. He  has a mild headache today. He endorses diaphoresis last night- attributes it to his migraine. He denies tremors and abdominal pain.  ? ?Patient reports that his anxiety has improved since last week. Patient was confusing Thorazine with  Toradol but clarified this with patient. He is tolerating Thorazine well and stated earlier in conversation anxiety had improved. Did not ask for BZD by name this interview.  ? ?Patient continues to endorse cravings for alcohol, which he acknowledges he will always struggle with. He is not interested in inpatient rehab and IOP at this time. He is not interested in outpatient psychiatry for medication management. He reports that he has a choice and will be able to maintain sobriety after discharge. Informed patient that we will put follow-up information in his discharge papers in case he needs it in the future. Discussed with patient the importance of sobriety in at least the first 30 days after discharge in an attempt to avoid overwhelming him with recommendation for lifelong sobriety (discussed later in conversation). He will follow-up with infectious disease to manage his HIV medications. ? ?Patient denies SI, HI, AVH, and paranoia today. His appetite is good and his mood is good. ? ?Collateral information:  ?Defer ? ?Psychiatric History:  ?Information collected from EMR ?Alcohol abuse, tobacco abuse, polysubstance abuse ?1 inpatient psychiatric admission 07/26/2021 discharged on doxepin milligrams at bedtime for insomnia and nausea; and gabapentin 300 mg 3 times daily for alcohol withdrawal syndrome ?  ?Patient has multiple inpatient admissions for alcohol detoxification, he is going to multiple alcohol rehabilitation programs ?Family psych history:  [name]- Bipolar Disorder and EtOH abuse ?Mother and Father- Gary Angtoh use disorder, remission ? ?Social History:  ?Homeless ?  ?Tobacco use: "Vapes nicotine "every once in a while" ?Alcohol use: Started at 36 yo, last week drank "2/5th vodka daily." ?Drug use: Denies ?  ?  ?Family History:  ?  ?The patient's family history includes Alcohol abuse in his father and mother. ? ?Medical History: ?Past Medical History:  ?Diagnosis Date  ? Alcohol abuse   ? HIV (human  immunodeficiency virus infection) (HCC)   ? Seizures (HCC)   ? Subdural hematoma   ? ? ?Surgical History: ?Past Surgical History:  ?Procedure Laterality Date  ? INCISION AND DRAINAGE PERIRECTAL ABSCESS N/A 09/24/2016  ? Procedure: IRRIGATION AND DEBRIDEMENT PERIRECTAL ABSCESS;  Surgeon: Ricarda Frameharles Woodham, MD;  Location: [facility] ORS;  Service: General;  Laterality: N/A;  ? none    ? ? ?Medications:  ? ?Current Facility-Administered Medications:  ?  acetaminophen (TYLENOL) tablet 650 mg, 650 mg, Oral, Q6H PRN, 650 mg at 01/25/22 1959 **OR** acetaminophen (TYLENOL) suppository 650 mg, 650 mg, Rectal, Q6H PRN, Katrinka BlazingSmith, Rondell A, MD ?  albuterol (PROVENTIL) (2.5 MG/3ML) 0.083% nebulizer solution 2.5 mg, 2.5 mg, Nebulization, Q6H PRN, Katrinka BlazingSmith, Rondell A, MD ?  alum & mag hydroxide-simeth (MAALOX/MYLANTA) 200-200-20 MG/5ML suspension 30 mL, 30 mL, Oral, Q6H PRN **AND** lidocaine (XYLOCAINE) 2 % viscous mouth solution 15 mL, 15 mL, Oral, Q6H PRN, Smith, Rondell A, MD ?  bictegravir-emtricitabine-tenofovir AF (BIKTARVY) 50-200-25 MG per tablet 1 tablet, 1 tablet, Oral, Daily, Pahwani, Rinka R, MD, 1 tablet at 01/26/22 1106 ?  chlorproMAZINE (THORAZINE) tablet 10 mg, 10 mg, Oral, BID PRN, Cinderella, Margaret A, 10 mg at 01/24/22 1337 ?  chlorproMAZINE (THORAZINE) tablet 10 mg, 10 mg, Oral, TID, Estella HuskLaubach, Katherine S, MD, 10 mg at 01/26/22 1619 ?  enoxaparin (LOVENOX) injection 40 mg, 40 mg, Subcutaneous, Q24H, Smith, Rondell A, MD, 40 mg at 01/23/22 0900 ?  folic acid (FOLVITE) tablet 1 mg, 1 mg, Oral, Daily, Katrinka Blazing, Rondell A, MD, 1 mg at 01/26/22 1106 ?  gabapentin (NEURONTIN) capsule 300 mg, 300 mg, Oral, TID, Katrinka Blazing, Rondell A, MD, 300 mg at 01/26/22 1619 ?  hydrALAZINE (APRESOLINE) injection 10 mg, 10 mg, Intravenous, Q4H PRN, Clydie Braun, MD ?  Melene Muller ON 03/16/2022] lacosamide (VIMPAT) tablet 100 mg, 100 mg, Oral, BID, Erick Blinks, MD ?  Melene Muller ON 03/09/2022] lamoTRIgine (LAMICTAL) tablet 100 mg, 100 mg, Oral, Daily  **AND** [START ON 03/09/2022] lamoTRIgine (LAMICTAL) tablet 75 mg, 75 mg, Oral, QHS, Erick Blinks, MD ?  [START ON 03/16/2022] lamoTRIgine (LAMICTAL) tablet 100 mg, 100 mg, Oral, BID, Erick Blinks, MD ?  lamoTRIgin

## 2022-01-26 NOTE — Progress Notes (Addendum)
?PROGRESS NOTE ? ? ? ?[name]  TDV:[id number] DOB: [birth year] DOA: 01/19/2022 ?PCP: Pcp, No ? ? ?Brief Narrative:  ?[name] is a 36 y.o. male with medical history significant of HIV not on treatment, polysubstance abuse (including but not limited to alcohol, tobacco, methamphetamines, marijuana), SDH, seizures, alcohol withdrawal with auditory/visual hallucinations, and suicidal ideations presented to the hospital with auditory hallucinations blurry vision.  Patient was recently hospitalized from 2/22-2/28 with alcohol withdrawals requiring placement on a Precedex drip in the ICU, but ultimately left AGAINST MEDICAL ADVICE.  Since leaving the hospital,patient reports that he has been drinking 2 fifths of liquor per day, and his last drink was yesterday at around noon time on the day of presentation.  In the ED, patient complained of chest pain shortness of breath and nausea and tingling and numbness in the feet.  His initial vitals were notable for mild tachycardia and tachypnea.  LFTs showed AST 77, ALT 79, alcohol level 323, and high-sensitivity troponin 4.  Acetaminophen and salicylate levels were undetectable.  UDS was negative.  Influenza and COVID-19 screening were negative.  Chest x-ray did not show any no acute abnormalities.  Patient was given 1 L of normal saline IV fluids and started on CIWA protocol.  Initial CIWA was 24.  Patient was involuntarily committed due to his reports of suicidal ideations.  ? ?01/26/2022: Patient was seen and examined at his bedside.  Had a seizure activity evening of 01/25/2022.  Seen by neurology.  Currently on AEDs.   ? ?Assessment & Plan: ?  ?Alcohol withdrawal hallucinosis (HCC)  ?-History of needing Precedex/phenobarbital for severe alcohol withdrawal.  ?- We will continue to monitor closely.  ?- Continue CIWA protocol.   ?-Continue IV thiamine, p.o. folate, and MVI. ?-Most recent CIWA: 4 ?-On fall/seizure precautions, continue. ?-Consult PT/OT-recommend no  follow-up needed ? ?Seizure disorder ?Reported seizure witnessed by staff on 01/25/2022 ?Seen by neurology ?Started on AEDs ?Seizure precautions in place. ?IV Ativan as needed for breakthrough seizures ?  ?Suicidal ideation ?Generalized anxiety disorder ?-Patient had initially expressed his thoughts of wanting to drink himself to death.  ?-Psychiatry consulted  ?-On trazodone and Thorazine. ?-3/18: Seen by psychiatry-increase the dose of Thorazine from 10 mg twice daily to 10 mg 3 times daily.  ?-Appreciate psychiatry evaluation.  ? ?Resolved chest pain, resolved. ?-EKG/ troponin negative.   ?Continue Protonix.    ?No further chest pain. ?  ?Resolved transaminitis ?- AST 77 and ALT 79 on presentation ?LFTs normalized. ? ?HIV disease (HCC) ?-Last CD4 count was 215 on 11/07/2021.  ?-Check HIV viral load and CD4 count: Pending  ?-Continue Biktarvy. ?-Need to follow-up with ID outpatient ? ?Resolved post repletion: Hypokalemia ?-Resolved.  Magnesium: WNL ? ?Macrocytosis ?-Chronic.  Thought to secondary to alcohol usage ?-H&H is stable ?  ?Cigarette smoker ?-Current smoker.  Counseling was done ?  ?Polysubstance abuse (HCC) ?-Urine drug screen this time was negative except for benzos.   ?-Recommend cessation ? ?Thrombocytopenia: Platelet 131 from 134.   ?No signs of active bleeding.   ?  ? ?DVT prophylaxis: Lovenox subcu daily. ?Code Status: Full code ?Family Communication: None at bedside.   ? ? ?Disposition Plan: To be determined ? ?Consultants:  ?Psychiatry ?Neurology ? ?Procedures:  ?None ? ?Antimicrobials:  ?None ? ?Status is: Inpatient ? ? ? ? ?Objective: ?Vitals:  ? 01/26/22 0412 01/26/22 0824 01/26/22 1136 01/26/22 1543  ?BP: 100/67 95/60 101/62 102/67  ?Pulse: 71 (!) 58 70 72  ?Resp: 16 16  17 17  ?Temp: 98.2 ?F (36.8 ?C) 98.3 ?F (36.8 ?C) 98.6 ?F (37 ?C) 98.6 ?F (37 ?C)  ?TempSrc: Oral Oral Oral Oral  ?SpO2: 96% 97% 98% 96%  ?Weight:      ? ? ?Intake/Output Summary (Last 24 hours) at 01/26/2022 1556 ?Last data  filed at 01/26/2022 0218 ?Gross per 24 hour  ?Intake 885 ml  ?Output --  ?Net 885 ml  ? ?Filed Weights  ? 01/24/22 0900  ?Weight: 68.5 kg  ? ? ?Examination:  ? ?General exam: Abdominal pain nausea no acute distress.  He is alert and oriented x3.   ?Respiratory system: Clear to auscultation with no wheezes or rales.   ?Cardiovascular system: Regular rate and rhythm no rubs or gallops. ?Gastrointestinal system: Soft normal bowel sounds present.   ?Central nervous system: Alert and awake.  No focal deficits.   ?Skin: No ulcerative lesions noted. ?Psychiatry: Mood is appropriate for condition and setting. ? ?Data Reviewed: I have personally reviewed following labs and imaging studies ? ?CBC: ?Recent Labs  ?Lab 01/19/22 ?2335 01/22/22 ?1008 01/23/22 ?0240 01/24/22 ?0113 01/25/22 ?3491 01/26/22 ?0116  ?WBC 6.1 14.4* 4.7 3.6* 4.9 4.1  ?NEUTROABS 4.2  --   --   --   --   --   ?HGB 14.4 8.6* 13.4 13.1 13.1 12.2*  ?HCT 42.7 26.3* 39.9 38.5* 37.8* 35.7*  ?MCV 101.2* 90.7 102.0* 102.1* 101.1* 101.1*  ?PLT 261 413* 163 138* 134* 131*  ? ?Basic Metabolic Panel: ?Recent Labs  ?Lab 01/20/22 ?0738 01/22/22 ?1008 01/23/22 ?0240 01/24/22 ?0113 01/25/22 ?7915 01/26/22 ?0116  ?NA  --  139 135 135 134* 130*  ?K  --  3.1* 4.2 4.3 3.9 3.6  ?CL  --  105 102 102 102 99  ?CO2  --  24 26 26 25 24   ?GLUCOSE  --  96 105* 118* 106* 102*  ?BUN  --  7 12 13 13 14   ?CREATININE  --  0.76 0.81 0.74 0.79 0.75  ?CALCIUM  --  8.9 9.2 9.6 9.2 8.7*  ?MG 1.8 2.1 2.1 2.1 1.9  --   ?PHOS 4.5  --   --   --  5.0*  --   ? ?GFR: ?Estimated Creatinine Clearance: 124.9 mL/min (by C-G formula based on SCr of 0.75 mg/dL). ?Liver Function Tests: ?Recent Labs  ?Lab 01/19/22 ?2335 01/23/22 ?0240 01/24/22 ?0113 01/25/22 ?01/26/22  ?AST 77* 40 32 26  ?ALT 79* 48* 46* 40  ?ALKPHOS 61 41 40 40  ?BILITOT 0.4 0.5 0.4 0.4  ?PROT 7.2 6.7 6.8 6.5  ?ALBUMIN 3.9 3.6 3.6 3.5  ? ?No results for input(s): LIPASE, AMYLASE in the last 168 hours. ?No results for input(s): AMMONIA in the last  168 hours. ?Coagulation Profile: ?No results for input(s): INR, PROTIME in the last 168 hours. ?Cardiac Enzymes: ?No results for input(s): CKTOTAL, CKMB, CKMBINDEX, TROPONINI in the last 168 hours. ?BNP (last 3 results) ?No results for input(s): PROBNP in the last 8760 hours. ?HbA1C: ?No results for input(s): HGBA1C in the last 72 hours. ?CBG: ?Recent Labs  ?Lab 01/25/22 ?1830  ?GLUCAP 112*  ? ?Lipid Profile: ?No results for input(s): CHOL, HDL, LDLCALC, TRIG, CHOLHDL, LDLDIRECT in the last 72 hours. ?Thyroid Function Tests: ?No results for input(s): TSH, T4TOTAL, FREET4, T3FREE, THYROIDAB in the last 72 hours. ?Anemia Panel: ?No results for input(s): VITAMINB12, FOLATE, FERRITIN, TIBC, IRON, RETICCTPCT in the last 72 hours. ?Sepsis Labs: ?No results for input(s): PROCALCITON, LATICACIDVEN in the last 168 hours. ? ?Recent  Results (from the past 240 hour(s))  ?Resp Panel by RT-PCR (Flu A&B, Covid) Nasopharyngeal Swab     Status: None  ? Collection Time: 01/19/22 11:47 PM  ? Specimen: Nasopharyngeal Swab; Nasopharyngeal(NP) swabs in vial transport medium  ?Result Value Ref Range Status  ? SARS Coronavirus 2 by RT PCR NEGATIVE NEGATIVE Final  ?  Comment: (NOTE) ?SARS-CoV-2 target nucleic acids are NOT DETECTED. ? ?The SARS-CoV-2 RNA is generally detectable in upper respiratory ?specimens during the acute phase of infection. The lowest ?concentration of SARS-CoV-2 viral copies this assay can detect is ?138 copies/mL. A negative result does not preclude SARS-Cov-2 ?infection and should not be used as the sole basis for treatment or ?other patient management decisions. A negative result may occur with  ?improper specimen collection/handling, submission of specimen other ?than nasopharyngeal swab, presence of viral mutation(s) within the ?areas targeted by this assay, and inadequate number of viral ?copies(<138 copies/mL). A negative result must be combined with ?clinical observations, patient history, and  epidemiological ?information. The expected result is Negative. ? ?Fact Sheet for Patients:  ?BloggerCourse.comhttps://www.fda.gov/media/152166/download ? ?Fact Sheet for Healthcare Providers:  ?SeriousBroker.ithttps://www.fda.gov/media/152162/download ?

## 2022-01-27 ENCOUNTER — Other Ambulatory Visit (HOSPITAL_COMMUNITY): Payer: Self-pay

## 2022-01-27 LAB — BASIC METABOLIC PANEL
Anion gap: 7 (ref 5–15)
BUN: 14 mg/dL (ref 6–20)
CO2: 25 mmol/L (ref 22–32)
Calcium: 9.2 mg/dL (ref 8.9–10.3)
Chloride: 102 mmol/L (ref 98–111)
Creatinine, Ser: 0.75 mg/dL (ref 0.61–1.24)
GFR, Estimated: >60 mL/min (ref >60)
Glucose, Bld: 96 mg/dL (ref 70–99)
Potassium: 4.3 mmol/L (ref 3.5–5.1)
Sodium: 134 mmol/L — ABNORMAL LOW (ref 135–145)

## 2022-01-27 LAB — CBC
HCT: 37.8 % — ABNORMAL LOW (ref 39.0–52.0)
Hemoglobin: 12.9 g/dL — ABNORMAL LOW (ref 13.0–17.0)
MCH: 34.7 pg — ABNORMAL HIGH (ref 26.0–34.0)
MCHC: 34.1 g/dL (ref 30.0–36.0)
MCV: 101.6 fL — ABNORMAL HIGH (ref 80.0–100.0)
Platelets: 138 10*3/uL — ABNORMAL LOW (ref 150–400)
RBC: 3.72 MIL/uL — ABNORMAL LOW (ref 4.22–5.81)
RDW: 12.8 % (ref 11.5–15.5)
WBC: 3.2 10*3/uL — ABNORMAL LOW (ref 4.0–10.5)
nRBC: 0 % (ref 0.0–0.2)

## 2022-01-27 MED ORDER — BICTEGRAVIR-EMTRICITAB-TENOFOV 50-200-25 MG PO TABS
1.0000 | ORAL_TABLET | Freq: Every day | ORAL | 3 refills | Status: DC
Start: 2022-01-27 — End: 2022-04-23
  Filled 2022-01-27: qty 30, 30d supply, fill #0

## 2022-01-27 MED ORDER — LAMOTRIGINE 25 MG PO TABS
ORAL_TABLET | ORAL | 0 refills | Status: DC
  Filled 2022-01-27: qty 70, 28d supply, fill #0

## 2022-01-27 MED ORDER — CHLORPROMAZINE HCL 10 MG PO TABS
10.0000 mg | ORAL_TABLET | Freq: Three times a day (TID) | ORAL | 3 refills | Status: DC
  Filled 2022-01-27: qty 90, 30d supply, fill #0

## 2022-01-27 MED ORDER — FOLIC ACID 1 MG PO TABS
1.0000 mg | ORAL_TABLET | Freq: Every day | ORAL | 3 refills | Status: DC
  Filled 2022-01-27: qty 30, 30d supply, fill #0

## 2022-01-27 MED ORDER — GABAPENTIN 300 MG PO CAPS
300.0000 mg | ORAL_CAPSULE | Freq: Three times a day (TID) | ORAL | 1 refills | Status: DC
  Filled 2022-01-27: qty 90, 30d supply, fill #0

## 2022-01-27 MED ORDER — LACOSAMIDE 100 MG PO TABS
100.0000 mg | ORAL_TABLET | Freq: Two times a day (BID) | ORAL | 1 refills | Status: DC
  Filled 2022-01-27: qty 60, 30d supply, fill #0

## 2022-01-27 NOTE — TOC Transition Note (Signed)
Transition of Care (TOC) - CM/SW Discharge Note ? ? ?Patient Details  ?Name: Gary [name] ?MRN: [id number] ?Date of Birth: 04-07-1986 ? ?Transition of Care (TOC) CM/SW Contact:  ?Lawerance Sabal, RN ?Phone Number: ?01/27/2022, 11:09 AM ? ? ?Clinical Narrative:    ?CM following for medication needs at DC. Meds sent through [facility] pharmacy, MATCH entered. Please ensure patient is sent home with meds in hand.  ? ? ? ?Final next level of care: Home/Self Care ?Barriers to Discharge: No Barriers Identified ? ? ?Patient Goals and CMS Choice ?  ?  ?  ? ?Discharge Placement ?  ?           ?  ?  ?  ?  ? ?Discharge Plan and Services ?  ?  ?           ?  ?  ?  ?  ?  ?  ?  ?  ?  ?  ? ?Social Determinants of Health (SDOH) Interventions ?  ? ? ?Readmission Risk Interventions ?No flowsheet data found. ? ? ? ? ?

## 2022-01-27 NOTE — Consult Note (Signed)
[name] Followup Face-to-Face Psychiatric Evaluation ? ? ?Service Date: January 27, 2022 ?LOS:  LOS: 7 days  ? ? ?Assessment  ?EDMUNDO [name] is a 36 y.o. male admitted medically for 01/19/2022 11:06 PM for Etoh Withdrawal hallucinosis. He carries the psychiatric diagnoses of Etoh Use disorder and has a past medical history of  Polysubstance abuse, HIV untreated, macrocytic anemia, transaminitis, thrombocytopenia. Psychiatry was consulted for SI by Joycelyn Das, MD.  ?  ?His current presentation of tremor, decreased sleep, irritability, tachycardia, requiring Ativan for symptom relief is most consistent with Etoh Withdrawal. He meets criteria for [facility] use disorder, severe based on his history.  Patient has not current OP psychotropic medications.On initial examination, patient endorses passive SI, communicates a want to be sober, but also endorses multiple reasons he would not like to try rehab or medication assistance to continue sobriety after detox. Please see plan below for detailed recommendations.  ? ?01/27/22: On assessment, patient is excited and ready for discharge. He continues to voice intentions of sobriety. He continues to decline IOP/inpatient rehab/psychiatry outpatient after discharge. Patient endorses anxiety has decreased since last week, today it is a 5/10. He denies symptoms of tremors, constipation, nausea, vomiting, dizziness, and blurred vision. He is tolerating Thorazine well. Has denied SI on numerous serial evaluations.  ? ?Diagnoses:  ?Active Hospital problems: ?Principal Problem: ?  Alcohol withdrawal hallucinosis (HCC) alcohol use disorder, severe, dependence alcohol-induced mood disorder ?Active Problems: ?  HIV disease (HCC) ?  Polysubstance abuse (HCC) ?  Cigarette smoker ?  Alcohol use disorder, severe, dependence (HCC) ?  Transaminitis ?  Substance induced mood disorder (HCC) ?  Suicidal ideation ?  Macrocytosis ?  Chest pain ?  Involuntary commitment secondary  to suicidal ideation ?  Hypokalemia ?  ? ? ?Plan  ?## Safety and Observation Level:  ?- Based on my clinical evaluation, I estimate the patient to be at low risk of self harm in the current setting ?- At this time, we recommend a routine level of observation. This decision is based on my review of the chart including patient's history and current presentation, interview of the patient, mental status examination, and consideration of suicide risk including evaluating suicidal ideation, plan, intent, suicidal or self-harm behaviors, risk factors, and protective factors. This judgment is based on our ability to directly address suicide risk, implement suicide prevention strategies and develop a safety plan while the patient is in the clinical setting. Please contact our team if there is a concern that risk level has changed. ? ? ?## Medications:  ?-- Continue Thorazine 10 mg TID for anxiety and BID PRN ?-- Continue Trazodone 50 mg qhs for sleep ? ?## Medical Decision Making Capacity:  ?-- Not formally assessed ? ?## Further Work-up:  ?-- None ? ?-- most recent EKG on 3/14 had QtC of 457 ?-- Pertinent labwork reviewed earlier this admission includes: Trp (-), AST 77 and ALT 79 , Last CD4 count 215 11/07/2021, EtoH- 323, MCV- 101.2 ? ?## Disposition:  ?-- Discharge today, patient is staying with a friend ? ?## Behavioral / Environmental:  ?-- Ativan PRN for CIWA> 5, verbal redirection ? ?##Legal Status ?-- IVC rescinded ? ?Thank you for this consult request. Recommendations have been communicated to the primary team.  We will sign-off at this time.  ? ?Cecilie Kicks, Medical Student ? ? ?Followup history  ?Relevant Aspects of Hospital Course:  ?Admitted on 01/19/2022 for [facility] withdrawal, hallucinosis.  ? ?Patient Report:  ?Patient was seen  this morning. He reports he woke up a couple of times last night but slept well. His mood is "good" today. He has a good appetite. He denies headache today. Patient reports his anxiety has  improved from last week, a 5/10 today. He does not think the Thorazine is helping with his anxiety, however objectively has improved since starting this medication. Counseled patient that he will be discharged with a supply of Thorazine and to continue to take the medicine, especially if he finds that once he was discharged his anxiety worsens. He denies constipation, nausea, vomiting, dizziness, and blurred vision. No alcohol tremors today. He denies SI, HI, AVH, and paranoia. ? ?Patient is excited to leave today and see his friends. He reports he will be staying with a friend. His friends have clearly made a boundary where he cannot drink until he has blackouts. His friends also do not drink. Patient reports he has no plan to drink after discharge. He feels confident that he can be sober for 24 hours after discharge. He is uncertain about 48 hour sobriety but reports he will "take it one-day at a time." Discusses that his housing is contingent on sobriety. Again discussed with patient the importance of sobriety for the first 30 days after discharge to avoid overwhelming him with lifelong sobriety. Informed patient that information for IOP/psychiatry outpatient/inpatient rehab will be in discharge paperwork. Patient is in agreement. ? ?Collateral information:  ?Defer ?  ?Psychiatric History:  ?Information collected from EMR ?Alcohol abuse, tobacco abuse, polysubstance abuse ?1 inpatient psychiatric admission 07/26/2021 discharged on doxepin milligrams at bedtime for insomnia and nausea; and gabapentin 300 mg 3 times daily for alcohol withdrawal syndrome ?  ?Patient has multiple inpatient admissions for alcohol detoxification, he is going to multiple alcohol rehabilitation programs ?Family psych history:  Brother- Bipolar Disorder and EtOH abuse ?Mother and Father- Lynden Ang use disorder, remission ?  ?Social History:  ?Homeless ?  ?Tobacco use: "Vapes nicotine "every once in a while" ?Alcohol use: Started at 35 yo, last  week drank "2/5th vodka daily." ?Drug use: Denies ?  ?Family History:  ?The patient's family history includes Alcohol abuse in his father and mother. ? ?Medical History: ?Past Medical History:  ?Diagnosis Date  ? Alcohol abuse   ? HIV (human immunodeficiency virus infection) (HCC)   ? Seizures (HCC)   ? Subdural hematoma   ? ? ?Surgical History: ?Past Surgical History:  ?Procedure Laterality Date  ? INCISION AND DRAINAGE PERIRECTAL ABSCESS N/A 09/24/2016  ? Procedure: IRRIGATION AND DEBRIDEMENT PERIRECTAL ABSCESS;  Surgeon: Ricarda Frame, MD;  Location: [facility] ORS;  Service: General;  Laterality: N/A;  ? none    ? ? ?Medications:  ? ?Current Facility-Administered Medications:  ?  acetaminophen (TYLENOL) tablet 650 mg, 650 mg, Oral, Q6H PRN, 650 mg at 01/25/22 1959 **OR** acetaminophen (TYLENOL) suppository 650 mg, 650 mg, Rectal, Q6H PRN, Katrinka Blazing, Rondell A, MD ?  albuterol (PROVENTIL) (2.5 MG/3ML) 0.083% nebulizer solution 2.5 mg, 2.5 mg, Nebulization, Q6H PRN, Katrinka Blazing, Rondell A, MD ?  alum & mag hydroxide-simeth (MAALOX/MYLANTA) 200-200-20 MG/5ML suspension 30 mL, 30 mL, Oral, Q6H PRN **AND** lidocaine (XYLOCAINE) 2 % viscous mouth solution 15 mL, 15 mL, Oral, Q6H PRN, Smith, Rondell A, MD ?  bictegravir-emtricitabine-tenofovir AF (BIKTARVY) 50-200-25 MG per tablet 1 tablet, 1 tablet, Oral, Daily, Pahwani, Rinka R, MD, 1 tablet at 01/27/22 1253 ?  chlorproMAZINE (THORAZINE) tablet 10 mg, 10 mg, Oral, BID PRN, Cinderella, Margaret A, 10 mg at 01/24/22 1337 ?  chlorproMAZINE (THORAZINE)  tablet 10 mg, 10 mg, Oral, TID, Estella HuskLaubach, Katherine S, MD, 10 mg at 01/27/22 1253 ?  enoxaparin (LOVENOX) injection 40 mg, 40 mg, Subcutaneous, Q24H, Smith, Rondell A, MD, 40 mg at 01/27/22 0900 ?  folic acid (FOLVITE) tablet 1 mg, 1 mg, Oral, Daily, Smith, Rondell A, MD, 1 mg at 01/27/22 0900 ?  gabapentin (NEURONTIN) capsule 300 mg, 300 mg, Oral, TID, Smith, Rondell A, MD, 300 mg at 01/27/22 0900 ?  hydrALAZINE (APRESOLINE) injection  10 mg, 10 mg, Intravenous, Q4H PRN, Clydie BraunSmith, Rondell A, MD ?  Melene Muller[START ON 03/16/2022] lacosamide (VIMPAT) tablet 100 mg, 100 mg, Oral, BID, Erick BlinksKhaliqdina, Salman, MD ?  Melene Muller[START ON 03/09/2022] lamoTRIgine (LAMICTAL

## 2022-01-27 NOTE — TOC Benefit Eligibility Note (Addendum)
Patient Advocate Encounter ? ?Completed and sent [employer] Advancing Access application for Biktarvy for this patient who is uninsured.   ? ? ?BIN      [id number]  ?PCN    [id number]  ?GRP    [id number] ?ID        [id number] ? ? ? ?Roland Earl, CPhT ?Pharmacy Patient Advocate Specialist ?[facility] Pharmacy Patient Advocate Team ?Direct Number: [phone]  Fax: [phone]  ?

## 2022-01-27 NOTE — Progress Notes (Signed)
Subjective: No further seizures. ? ?ROS: negative except above ? ?Examination ? ?Vital signs in last 24 hours: ?Temp:  [97.6 ?F (36.4 ?C)-98.6 ?F (37 ?C)] 97.6 ?F (36.4 ?C) (03/21 7353) ?Pulse Rate:  [70-77] 77 (03/21 0807) ?Resp:  [14-18] 18 (03/21 2992) ?BP: (101-106)/(62-74) 102/72 (03/21 4268) ?SpO2:  [95 %-98 %] 95 % (03/21 0340) ? ?General: Sitting in bed eating lunch, not in apparent distress ? ?Neuro: ?MS: Alert, oriented, follows commands ?CN: pupils equal and reactive,  EOMI, face symmetric, tongue midline, normal sensation over face, ?Motor: 5/5 strength in all 4 extremities ? ? ?Basic Metabolic Panel: ?Recent Labs  ?Lab 01/22/22 ?1008 01/23/22 ?0240 01/24/22 ?0113 01/25/22 ?3419 01/26/22 ?0116 01/27/22 ?6222  ?NA 139 135 135 134* 130* 134*  ?K 3.1* 4.2 4.3 3.9 3.6 4.3  ?CL 105 102 102 102 99 102  ?CO2 24 26 26 25 24 25   ?GLUCOSE 96 105* 118* 106* 102* 96  ?BUN 7 12 13 13 14 14   ?CREATININE 0.76 0.81 0.74 0.79 0.75 0.75  ?CALCIUM 8.9 9.2 9.6 9.2 8.7* 9.2  ?MG 2.1 2.1 2.1 1.9  --   --   ?PHOS  --   --   --  5.0*  --   --   ? ? ?CBC: ?Recent Labs  ?Lab 01/23/22 ?0240 01/24/22 ?0113 01/25/22 ?01/26/22 01/26/22 ?0116 01/27/22 ?01/28/22  ?WBC 4.7 3.6* 4.9 4.1 3.2*  ?HGB 13.4 13.1 13.1 12.2* 12.9*  ?HCT 39.9 38.5* 37.8* 35.7* 37.8*  ?MCV 102.0* 102.1* 101.1* 101.1* 101.6*  ?PLT 163 138* 134* 131* 138*  ? ? ? ?Coagulation Studies: ?No results for input(s): LABPROT, INR in the last 72 hours. ? ?Imaging ?MRI brain without contrast 12/31/2021: No acute intracranial pathology. No evidence of Wernicke's encephalopathy. Scattered small foci of FLAIR signal abnormality in the subcortical and periventricular white matter are nonspecific but are advanced for age. Finding may reflect age advanced chronic white matter microangiopathy, migraines, prior infection/ inflammation, among other etiologies. Extensive mucosal thickening in the maxillary sinuses, new since the prior study from November 2022. ? ?ASSESSMENT AND PLAN: 36 y.o.  male with PMH significant for EtOh use, HIV, prior Etoh withdrawal seizure, prior traumatic SDH in Aug 2022, polysubstance use who is admitted with auditory hallucinations, blurry vision, suicidal ideation. He was here for 6 days, on CIWA for alcohol withdrawal, score 4. He was noted to have seizure like activity in the evening of 01/26/2022, 6 days after his last EtOH use.  ? ?Epilepsy ?-Patient has prior history of dural hematoma as well as alcohol withdrawal seizures.  This seizure happened about 6 days after his last alcohol use with low CIWA scores.  Therefore would recommend daily antiepileptics for now ? ?Recommendations ?-Continue Vimpat 100 mg twice daily for 4 weeks while we gradually increase lamotrigine to a goal dose of lamotrigine 100 mg twice daily per orange starter pack. ?-Continue seizure precautions including do not drive ?-Follow-up with neurology in 3 months ?-Alcohol cessation counseling.  Discussed the importance of taking seizure medications, seizure provoking factors including lack of sleep, alcohol use, drug use. ?-As needed IV Ativan 2 mg for clinical seizure-like activity while in the hospital ? ?Seizure precautions: ?Per [facility] statutes, patients with seizures are not allowed to drive until they have been seizure-free for six months and cleared by a physician  ?  ?Use caution when using heavy equipment or power tools. Avoid working on ladders or at heights. Take showers instead of baths. Ensure the water temperature is  not too high on the home water heater. Do not go swimming alone. Do not lock yourself in a room alone (i.e. bathroom). When caring for infants or small children, sit down when holding, feeding, or changing them to minimize risk of injury to the child in the event you have a seizure. Maintain good sleep hygiene. Avoid alcohol.  ?  ?If patient has another seizure, call 911 and bring them back to the ED if: ?A.  The seizure lasts longer than 5 minutes.      ?B.   The patient doesn't wake shortly after the seizure or has new problems such as difficulty seeing, speaking or moving following the seizure ?C.  The patient was injured during the seizure ?D.  The patient has a temperature over 102 F (39C) ?E.  The patient vomited during the seizure and now is having trouble breathing ?   ?During the Seizure ?  ?- First, ensure adequate ventilation and place patients on the floor on their left side  ?Loosen clothing around the neck and ensure the airway is patent. If the patient is clenching the teeth, do not force the mouth open with any object as this can cause severe damage ?- Remove all items from the surrounding that can be hazardous. The patient may be oblivious to what's happening and may not even know what he or she is doing. ?If the patient is confused and wandering, either gently guide him/her away and block access to outside areas ?- Reassure the individual and be comforting ?- Call 911. In most cases, the seizure ends before EMS arrives. However, there are cases when seizures may last over 3 to 5 minutes. Or the individual may have developed breathing difficulties or severe injuries. If a pregnant patient or a person with diabetes develops a seizure, it is prudent to call an ambulance. ?- Finally, if the patient does not regain full consciousness, then call EMS. Most patients will remain confused for about 45 to 90 minutes after a seizure, so you must use judgment in calling for help. ?  ? After the Seizure (Postictal Stage) ?  ?After a seizure, most patients experience confusion, fatigue, muscle pain and/or a headache. Thus, one should permit the individual to sleep. For the next few days, reassurance is essential. Being calm and helping reorient the person is also of importance. ?  ?Most seizures are painless and end spontaneously. Seizures are not harmful to others but can lead to complications such as stress on the lungs, brain and the heart. Individuals with prior  lung problems may develop labored breathing and respiratory distress.  ? ?  ?I have spent a total of  35  minutes with the patient reviewing hospital notes,  test results, labs and examining the patient as well as establishing an assessment and plan that was discussed personally with the patient.  > 50% of time was spent in direct patient care. ?  ?Lindie Spruce ?Epilepsy ?[facility] ?For questions after 5pm please refer to AMION to reach the Neurologist on call ? ?

## 2022-01-27 NOTE — Discharge Summary (Signed)
?Physician Discharge Summary ?  ?Patient: Gary [name] MRN: [id number] DOB: 1986/06/30  ?Admit date:     01/19/2022  ?Discharge date: 01/27/22  ?Discharge Physician: Alberteen Sam  ? ?PCP: Pcp, No  ? ?Recommendations at discharge:  ?Follow up with [facility] Neurology in 3-4 weeks ?Follow up with Infectious Disease Dr. Daiva Eves as soon as able ?Follow up with alcohol treatment resources as given ?Follow up with Behavioral Health ? ? ? ? ? ?Discharge Diagnoses: ?Principal Problem: ?  Alcohol withdrawal hallucinosis (HCC) alcohol use disorder, severe, dependence alcohol-induced mood disorder ?Active Problems: ?  Alcohol use disorder, severe, dependence (HCC) ?  Seizure ?  Alcoholic hepatitis ?  Suicidal ideation ?  HIV disease (HCC) ?  Polysubstance abuse (HCC) ?  Cigarette smoker ?  Substance induced mood disorder (HCC) ?  Macrocytosis ?  Hypokalemia ?  ? ? ? ? ?Hospital Course: ?Gary [name] is a 36 y.o. male with medical history significant of HIV not on treatment, polysubstance abuse (including but not limited to alcohol, tobacco, methamphetamines, marijuana), SDH, seizures, alcohol withdrawal with auditory/visual hallucinations, and suicidal ideations presented to the hospital with auditory hallucinations blurry vision.   ? ?Patient was hospitalized 2 weeks prior to this in Feb with alcohol withdrawals requiring placement on a Precedex drip in the ICU, but ultimately left AGAINST MEDICAL ADVICE.  Patient resumed drinking up to 2 fifths of liquor per day ? ?He then returned to the hospital, seeking detox, with chest pain shortness of breath and nausea and tingling and numbness in the feet.  He also reported suicidal ideation.   ? ? ? ? ? ?Assessment and Plan: ?* Alcohol withdrawal hallucinosis (HCC) alcohol use disorder, severe, dependence alcohol-induced mood disorder ?Treated here with on demand Lorazepam and symptoms were reasonably well controlled, without delirium. ? ?Alcohol cessation resources  were recommended, counseling given by me and Psychiatry.  Patient actively wants to quit, expects to do this without medication assistance, therapy, or IOP unfortunately. ? ?Resources given. ? ? ?Seizure ?Patient developed witnessed seizure in the hospital.  This was well outside the withdrawal window and Neurology felt this was likely chronic in nature, related to his prior SDH, his heavy chronic alcohol use.  EEG confirmed epileptic focus.  He was started on Vimpat here. ? ?Neurology recommended iniation of Lamictal taper up over next 8 weeks to 100 BID. ? ?They recmomend taper off Vimpat over next 7 weeks. ? ?They plan for patient to be seen in Neurology clinic in 3-4 weeks ? ? ? ?Involuntary commitment secondary to suicidal ideation ?Anxiety ?Psychiatry evaluated the patient, rescinded his IVC.  They initiated treatment for anxiety with Thorazine TID. ? ? ?HIV disease (HCC) ?CD4 count here ~300.  Reinitiated Biktarvy.  Refills sent.  Recommended ID office follow up.   Patient has intentions to follow up. ? ? ? ? ?  ? ?Pain control - [employer] Controlled Substance Reporting System database was reviewed.  ? ? ? ?Consultants: Neurology, Psychiatry ?Procedures performed: EEG, MRI brain ?Disposition: Home ?Diet recommendation: Regular ? ?DISCHARGE MEDICATION: ?Allergies as of 01/27/2022   ? ?   Reactions  ? Tegretol [carbamazepine] Other (See Comments)  ? Caused vertigo for 2 days after taking it  ? Caffeine Palpitations  ? ?  ? ?  ?Medication List  ?  ? ?TAKE these medications   ? ?Biktarvy 50-200-25 MG Tabs tablet ?Generic drug: bictegravir-emtricitabine-tenofovir AF ?Take 1 tablet by mouth daily. ?  ?chlorproMAZINE 10 MG tablet ?Commonly known  as: THORAZINE ?Take 1 tablet (10 mg total) by mouth 3 (three) times daily. ?  ?folic acid 1 MG tablet ?Commonly known as: FOLVITE ?Take 1 tablet (1 mg total) by mouth daily. ?Start taking on: January 28, 2022 ?  ?gabapentin 300 MG capsule ?Commonly known as:  NEURONTIN ?Take 1 capsule (300 mg total) by mouth 3 (three) times daily. ?  ?Lacosamide 100 MG Tabs ?Take 1 tablet (100 mg total) by mouth 2 (two) times daily. ?Start taking on: Mar 16, 2022 ?  ?lamoTRIgine 25 MG tablet ?Commonly known as: LAMICTAL ?Take 1 tablet (25mg ) by mouth daily and  increase dose by 1 tablet (25 mg) each week. ?  ? ?  ? ? Follow-up Information   ? ? [facility]. Call.   ?Why: Call to schedule an appointment ?Contact information: ?[address]     [address] ?[place] ?[phone] ? ?  ?  ? ? [phone], Daiva Eves, MD. Schedule an appointment as soon as possible for a visit in 1 week(s).   ?Specialty: Infectious Diseases ?Contact information: ?[address] ?[place] ?[phone] ? ? ?  ?  ? ?  ?  ? ?  ? ?Discharge Instructions   ? ? Ambulatory referral to Neurology   Complete by: As directed ?  ? An appointment is requested in approximately: 4 weeks ?The patient has no phone at present. Please DO NOT call him, he will call [facility] ?For reason: SEIZURE  ? Discharge instructions   Complete by: As directed ?  ? From Dr. [phone]: ?You were admitted for alcohol delirium and mental breakdown. ? ?Here, for the alcohol treatment, you were given medicines to prevent withdrawal, and this withdrawal episode seems like it is over. ?You were started on gabapentin for withdrawals, and you may continue this medicine for another month, it will help with the symptoms of alcohol withdrawal that persist for the next couple weeks. ?Take gabapentin 300 mg three times daily. ?Don't stop this abruptly.  When you get the refill of gabapentin in 1 month, start to slowly taper. ?Take twice diaily for 3 days then take once daily for 3 days then stop ? ? ?You also had a seizure ?It is important that you go see a Neurologist in 1 month ?Call [facility] to schedule an appointment. ? ?Also: take lacosamide/Vimpat and also  lamotrigine/Lamictal  ?You will taper off the Vimpat over 8 weeks while you taper up Lamictal ? ?So: ?Take lacosamide/Vimpat 100 mg twice daily for 7 weeks, then stop on May 9 ? ?For the Lamictal, you will taper up by 25 mg each week for 8 weeks: ?(I.e for the first week, take 25 mg once daily, then the second week take 25 mg twice daily, then the third week take 50 mg/2 tabs in the morning and 25 mg/1 tab at night, then the fourth week take 50 mg/2 tabs twice daily, then 75/3 tabs in morning and 2 tabs/50 mg at night, etc.) ? ? ?Lamotrigine Morning Evening ?Week 1 25mg    ?Week 2 25mg              25mg  ?Week 3 50mg              25mg  ?Week 4 50mg              50mg  ?Week 5 75mg              50mg  ?Week 6 75mg   75mg  ?Week 7 100mg              75mg  ?Week 8 100mg              100mg  ? ? ?Follow up with [place]. [place] ?Take Biktarvy ?I recommend an intensive outpatient program for alcohol cessation  ? Increase activity slowly   Complete by: As directed ?  ? ?  ? ? ?Discharge Exam: ?Filed Weights  ? 01/24/22 0900  ?Weight: 68.5 kg  ? ?General: Pt is alert, awake, not in acute distress, sitting crosslegged up in bed ?Cardiovascular: RRR, nl S1-S2, no murmurs appreciated.   No LE edema.   ?Respiratory: Normal respiratory rate and rhythm.  CTAB without rales or wheezes. ?Abdominal: Abdomen soft and non-tender.  No distension or HSM.   ?Neuro/Psych: Strength symmetric in upper and lower extremities.  Judgment and insight appear normal. ? ? ?Condition at discharge: fair ? ?The results of significant diagnostics from this hospitalization (including imaging, microbiology, ancillary and laboratory) are listed below for reference.  ? ?Imaging Studies: ?MR BRAIN WO CONTRAST ? ?Result Date: 12/31/2021 ?CLINICAL DATA:  Altered mental status, concern for Wernicke's EXAM: MRI HEAD WITHOUT CONTRAST TECHNIQUE: Multiplanar, multiecho pulse sequences of the brain and surrounding structures were obtained without intravenous contrast.  COMPARISON:  CT head 09/26/2021 FINDINGS: Brain: There is no evidence of acute intracranial hemorrhage, extra-axial fluid collection, or acute infarct. Background parenchymal volume is normal. The ventricles are normal in size. There are [place]

## 2022-02-03 ENCOUNTER — Emergency Department (HOSPITAL_COMMUNITY)
Admission: EM | Admit: 2022-02-03 | Discharge: 2022-02-04 | Disposition: A | Discharge status: Home / Self Care | Payer: Self-pay | Attending: Emergency Medicine | Admitting: Emergency Medicine

## 2022-02-03 ENCOUNTER — Other Ambulatory Visit: Payer: Self-pay

## 2022-02-03 DIAGNOSIS — F10120 Alcohol abuse with intoxication, uncomplicated: Secondary | ICD-10-CM | POA: Insufficient documentation

## 2022-02-03 DIAGNOSIS — Z21 Asymptomatic human immunodeficiency virus [HIV] infection status: Secondary | ICD-10-CM | POA: Insufficient documentation

## 2022-02-03 DIAGNOSIS — Y908 Blood alcohol level of 240 mg/100 ml or more: Secondary | ICD-10-CM | POA: Insufficient documentation

## 2022-02-03 DIAGNOSIS — F10920 Alcohol use, unspecified with intoxication, uncomplicated: Secondary | ICD-10-CM

## 2022-02-03 LAB — COMPREHENSIVE METABOLIC PANEL
ALT: 44 U/L (ref 0–44)
AST: 42 U/L — ABNORMAL HIGH (ref 15–41)
Albumin: 4.3 g/dL (ref 3.5–5.0)
Alkaline Phosphatase: 49 U/L (ref 38–126)
Anion gap: 10 (ref 5–15)
BUN: <5 mg/dL — ABNORMAL LOW (ref 6–20)
CO2: 25 mmol/L (ref 22–32)
Calcium: 8.8 mg/dL — ABNORMAL LOW (ref 8.9–10.3)
Chloride: 104 mmol/L (ref 98–111)
Creatinine, Ser: 0.51 mg/dL — ABNORMAL LOW (ref 0.61–1.24)
GFR, Estimated: >60 mL/min (ref >60)
Glucose, Bld: 106 mg/dL — ABNORMAL HIGH (ref 70–99)
Potassium: 3.4 mmol/L — ABNORMAL LOW (ref 3.5–5.1)
Sodium: 139 mmol/L (ref 135–145)
Total Bilirubin: 0.1 mg/dL — ABNORMAL LOW (ref 0.3–1.2)
Total Protein: 8.1 g/dL (ref 6.5–8.1)

## 2022-02-03 LAB — CBC WITH DIFFERENTIAL/PLATELET
Abs Immature Granulocytes: 0.01 10*3/uL (ref 0.00–0.07)
Basophils Absolute: 0.1 10*3/uL (ref 0.0–0.1)
Basophils Relative: 2 %
Eosinophils Absolute: 0.1 10*3/uL (ref 0.0–0.5)
Eosinophils Relative: 3 %
HCT: 41.5 % (ref 39.0–52.0)
Hemoglobin: 14.2 g/dL (ref 13.0–17.0)
Immature Granulocytes: 0 %
Lymphocytes Relative: 55 %
Lymphs Abs: 2.3 10*3/uL (ref 0.7–4.0)
MCH: 34.2 pg — ABNORMAL HIGH (ref 26.0–34.0)
MCHC: 34.2 g/dL (ref 30.0–36.0)
MCV: 100 fL (ref 80.0–100.0)
Monocytes Absolute: 0.3 10*3/uL (ref 0.1–1.0)
Monocytes Relative: 6 %
Neutro Abs: 1.5 10*3/uL — ABNORMAL LOW (ref 1.7–7.7)
Neutrophils Relative %: 34 %
Platelets: 397 10*3/uL (ref 150–400)
RBC: 4.15 MIL/uL — ABNORMAL LOW (ref 4.22–5.81)
RDW: 13.2 % (ref 11.5–15.5)
WBC: 4.3 10*3/uL (ref 4.0–10.5)
nRBC: 0 % (ref 0.0–0.2)

## 2022-02-03 LAB — ETHANOL: Alcohol, Ethyl (B): 479 mg/dL (ref <10)

## 2022-02-03 MED ORDER — LORAZEPAM 2 MG/ML IJ SOLN: 1.0000 mg | Freq: Once | INTRAMUSCULAR | Status: DC

## 2022-02-03 MED ORDER — SODIUM CHLORIDE 0.9 % IV BOLUS
1000.0000 mL | Freq: Once | INTRAVENOUS | Status: AC
  Administered 2022-02-03: 1000 mL via INTRAVENOUS

## 2022-02-03 NOTE — ED Triage Notes (Signed)
BIB EMS from the streets for alcohol intoxication, stole a box of wine , EMS states he was combative ?

## 2022-02-03 NOTE — ED Provider Notes (Signed)
?[facility]-EMERGENCY DEPT ?Provider Note ? ? ?CSN: [id number] ?Arrival date & time: 02/03/22  2103 ? ?  ? ?History ? ?Chief Complaint  ?Patient presents with  ? Alcohol Intoxication  ? ? ?Gary [name] is a 36 y.o. male with a history of EtOH abuse, subdural hematoma, and HIV who presents to the emergency department via police for alcohol intoxication.  Per police they received a call that the patient was drinking boxed wine off of someone else's porch by a bystander who could not get the patient to communicate with them therefore they called 911. Per Police patient with 3 empty boxes of wine, not following commands or cooperative. Patient not answering questions - level 5 caveat applies secondary to EtOH intoxication.  ? ?HPI ? ?  ? ?Home Medications ?Prior to Admission medications   ?Medication Sig Start Date End Date Taking? Authorizing Provider  ?bictegravir-emtricitabine-tenofovir AF (BIKTARVY) 50-200-25 MG TABS tablet Take 1 tablet by mouth daily. 01/27/22   Danford, Earl Lites, MD  ?chlorproMAZINE (THORAZINE) 10 MG tablet Take 1 tablet (10 mg total) by mouth 3 (three) times daily. 01/27/22   Danford, Earl Lites, MD  ?folic acid (FOLVITE) 1 MG tablet Take 1 tablet (1 mg total) by mouth daily. 01/28/22   Danford, Earl Lites, MD  ?gabapentin (NEURONTIN) 300 MG capsule Take 1 capsule (300 mg total) by mouth 3 (three) times daily. 01/27/22   Danford, Earl Lites, MD  ?Lacosamide 100 MG TABS Take 1 tablet (100 mg total) by mouth 2 (two) times daily. 03/16/22   Danford, Earl Lites, MD  ?lamoTRIgine (LAMICTAL) 25 MG tablet Take 1 tablet (25mg ) by mouth daily and  increase dose by 1 tablet (25 mg) each week. 01/27/22   Danford, 01/29/22, MD  ?famotidine (PEPCID) 20 MG tablet Take 1 tablet (20 mg total) by mouth 2 (two) times daily. ?Patient not taking: Reported on 06/01/2019 05/14/19 06/02/19  06/04/19, MD  ?   ? ?Allergies    ?Tegretol [carbamazepine] and Caffeine   ? ?Review of  Systems   ?Review of Systems  ?Unable to perform ROS: Mental status change  ? ?Physical Exam ?Updated Vital Signs ?Ht 6' (1.829 m)   Wt 68.5 kg   BMI 20.48 kg/m?  ?Physical Exam ?Vitals and nursing note reviewed.  ?Constitutional:   ?   Comments: Patient is sleeping, responsive to painful stimuli.  ?HENT:  ?   Head:  ?   Comments: Normocephalic, atraumatic, no raccoon eyes or battle sign. ?Eyes:  ?   Pupils: Pupils are equal, round, and reactive to light.  ?Cardiovascular:  ?   Rate and Rhythm: Normal rate and regular rhythm.  ?Pulmonary:  ?   Effort: Pulmonary effort is normal.  ?   Breath sounds: Normal breath sounds.  ?Abdominal:  ?   General: There is no distension.  ?   Palpations: Abdomen is soft.  ?   Tenderness: There is no abdominal tenderness.  ?Skin: ?   General: Skin is warm and dry.  ?Neurological:  ?   Comments: Sleeping.  Responsive to painful stimuli.  Not answering questions or following commands.  ? ? ?ED Results / Procedures / Treatments   ?Labs ?(all labs ordered are listed, but only abnormal results are displayed) ?Labs Reviewed  ?CBC WITH DIFFERENTIAL/PLATELET - Abnormal; Notable for the following components:  ?    Result Value  ? RBC 4.15 (*)   ? MCH 34.2 (*)   ? Neutro Abs 1.5 (*)   ?  All other components within normal limits  ?COMPREHENSIVE METABOLIC PANEL - Abnormal; Notable for the following components:  ? Potassium 3.4 (*)   ? Glucose, Bld 106 (*)   ? BUN <5 (*)   ? Creatinine, Ser 0.51 (*)   ? Calcium 8.8 (*)   ? AST 42 (*)   ? Total Bilirubin 0.1 (*)   ? All other components within normal limits  ?ETHANOL - Abnormal; Notable for the following components:  ? Alcohol, Ethyl (B) 479 (*)   ? All other components within normal limits  ? ? ?EKG ?None ? ?Radiology ?No results found. ? ?Procedures ?Procedures  ? ? ?Medications Ordered in ED ?Medications  ?sodium chloride 0.9 % bolus 1,000 mL (1,000 mLs Intravenous New Bag/Given 02/03/22 2215)  ? ? ?ED Course/ Medical Decision Making/ A&P ?  ?                         ?Medical Decision Making ?Amount and/or Complexity of Data Reviewed ?Labs: ordered. ? ? ?Patient presents to the ED for evaluation of alcohol intoxication.  Patient is nontoxic, resting comfortably, vitals unremarkable. Patient sleeping, arousible to painful stimuli.  ? ?Additional history obtained:  ?Chart & nursing note reviewed.  ?Multiple prior ED visits for EtOH intoxication/withdrawal, recent admissions for same.  ? ?Lab Tests:  ?I reviewed & interpreted labs including:  ?CBC: Unremarkable.  ?CMP: no critical electrolyte derangement.  ?Ethanol level: notably elevated @ 479 ? ?02:00: RE-EVAL: Patient is awake, he is oriented x 4, he is not showing signs of alcohol withdrawal at this time, tolerating PO and ambulatory. Will discharge on librium taper into police custody at this time. I discussed results, treatment plan, and return precautions. Discussed with attending Dr. Nicanor Alcon- in agreement.  ?  ?Portions of this note were generated with Scientist, clinical (histocompatibility and immunogenetics). Dictation errors may occur despite best attempts at proofreading. ? ?Final Clinical Impression(s) / ED Diagnoses ?Final diagnoses:  ?Alcoholic intoxication without complication (HCC)  ? ? ?Rx / DC Orders ?ED Discharge Orders   ? ?      Ordered  ?  chlordiazePOXIDE (LIBRIUM) 25 MG capsule       ? 02/04/22 0222  ? ?  ?  ? ?  ? ? ?  ?Cherly Anderson, [place] ?02/04/22 8115 ? ?  ?Palumbo, April, MD ?02/04/22 0421 ? ?

## 2022-02-04 MED ORDER — CHLORDIAZEPOXIDE HCL 25 MG PO CAPS
50.0000 mg | ORAL_CAPSULE | Freq: Once | ORAL | Status: AC
  Administered 2022-02-04: 50 mg via ORAL
  Filled 2022-02-04: qty 2

## 2022-02-04 MED ORDER — CHLORDIAZEPOXIDE HCL 25 MG PO CAPS: ORAL_CAPSULE | ORAL | 0 refills | Status: DC

## 2022-02-04 NOTE — ED Notes (Addendum)
Pt ambulated around ER with one assist and given 237 ml ginger ale and drank it with no issues, provider aware ?

## 2022-02-04 NOTE — Discharge Instructions (Addendum)
Take Librium to help with alcohol withdrawal. ? ?Follow up with primary care as soon as possible. We have provided counseling resources.  Return to the emergency department for new or worsening symptoms including but limited to hallucinations, tremors, seizure activity, inability to keep fluids down, normal behavior, new or worsening pain, or any other concerns. ?

## 2022-03-12 ENCOUNTER — Emergency Department (HOSPITAL_COMMUNITY)
Admission: EM | Admit: 2022-03-12 | Discharge: 2022-03-13 | Disposition: A | Discharge status: Home / Self Care | Payer: Self-pay | Attending: Emergency Medicine | Admitting: Emergency Medicine

## 2022-03-12 ENCOUNTER — Encounter (HOSPITAL_COMMUNITY): Payer: Self-pay

## 2022-03-12 ENCOUNTER — Emergency Department (HOSPITAL_COMMUNITY): Payer: Self-pay

## 2022-03-12 DIAGNOSIS — F1094 Alcohol use, unspecified with alcohol-induced mood disorder: Secondary | ICD-10-CM | POA: Diagnosis present

## 2022-03-12 DIAGNOSIS — F1020 Alcohol dependence, uncomplicated: Secondary | ICD-10-CM | POA: Diagnosis present

## 2022-03-12 DIAGNOSIS — R45851 Suicidal ideations: Secondary | ICD-10-CM | POA: Insufficient documentation

## 2022-03-12 DIAGNOSIS — F17210 Nicotine dependence, cigarettes, uncomplicated: Secondary | ICD-10-CM | POA: Insufficient documentation

## 2022-03-12 DIAGNOSIS — Z20822 Contact with and (suspected) exposure to covid-19: Secondary | ICD-10-CM | POA: Insufficient documentation

## 2022-03-12 DIAGNOSIS — B20 Human immunodeficiency virus [HIV] disease: Secondary | ICD-10-CM | POA: Diagnosis present

## 2022-03-12 DIAGNOSIS — F10288 Alcohol dependence with other alcohol-induced disorder: Secondary | ICD-10-CM | POA: Insufficient documentation

## 2022-03-12 DIAGNOSIS — Z21 Asymptomatic human immunodeficiency virus [HIV] infection status: Secondary | ICD-10-CM | POA: Diagnosis present

## 2022-03-12 DIAGNOSIS — F1010 Alcohol abuse, uncomplicated: Secondary | ICD-10-CM

## 2022-03-12 DIAGNOSIS — F332 Major depressive disorder, recurrent severe without psychotic features: Secondary | ICD-10-CM | POA: Insufficient documentation

## 2022-03-12 DIAGNOSIS — R10816 Epigastric abdominal tenderness: Secondary | ICD-10-CM | POA: Insufficient documentation

## 2022-03-12 DIAGNOSIS — R443 Hallucinations, unspecified: Secondary | ICD-10-CM

## 2022-03-12 DIAGNOSIS — F10239 Alcohol dependence with withdrawal, unspecified: Secondary | ICD-10-CM | POA: Insufficient documentation

## 2022-03-12 DIAGNOSIS — Y908 Blood alcohol level of 240 mg/100 ml or more: Secondary | ICD-10-CM | POA: Insufficient documentation

## 2022-03-12 LAB — URINALYSIS, ROUTINE W REFLEX MICROSCOPIC
Bilirubin Urine: NEGATIVE
Glucose, UA: NEGATIVE mg/dL
Hgb urine dipstick: NEGATIVE
Ketones, ur: 20 mg/dL — AB
Leukocytes,Ua: NEGATIVE
Nitrite: NEGATIVE
Protein, ur: NEGATIVE mg/dL
Specific Gravity, Urine: 1.014 (ref 1.005–1.030)
pH: 5 (ref 5.0–8.0)

## 2022-03-12 LAB — CBC
HCT: 41.5 % (ref 39.0–52.0)
Hemoglobin: 14.3 g/dL (ref 13.0–17.0)
MCH: 34.2 pg — ABNORMAL HIGH (ref 26.0–34.0)
MCHC: 34.5 g/dL (ref 30.0–36.0)
MCV: 99.3 fL (ref 80.0–100.0)
Platelets: 104 10*3/uL — ABNORMAL LOW (ref 150–400)
RBC: 4.18 MIL/uL — ABNORMAL LOW (ref 4.22–5.81)
RDW: 14.2 % (ref 11.5–15.5)
WBC: 3.7 10*3/uL — ABNORMAL LOW (ref 4.0–10.5)
nRBC: 0 % (ref 0.0–0.2)

## 2022-03-12 LAB — CK: Total CK: 188 U/L (ref 49–397)

## 2022-03-12 LAB — COMPREHENSIVE METABOLIC PANEL
ALT: 49 U/L — ABNORMAL HIGH (ref 0–44)
AST: 79 U/L — ABNORMAL HIGH (ref 15–41)
Albumin: 4.2 g/dL (ref 3.5–5.0)
Alkaline Phosphatase: 58 U/L (ref 38–126)
Anion gap: 16 — ABNORMAL HIGH (ref 5–15)
BUN: 9 mg/dL (ref 6–20)
CO2: 22 mmol/L (ref 22–32)
Calcium: 8.5 mg/dL — ABNORMAL LOW (ref 8.9–10.3)
Chloride: 104 mmol/L (ref 98–111)
Creatinine, Ser: 0.52 mg/dL — ABNORMAL LOW (ref 0.61–1.24)
GFR, Estimated: >60 mL/min (ref >60)
Glucose, Bld: 78 mg/dL (ref 70–99)
Potassium: 4 mmol/L (ref 3.5–5.1)
Sodium: 142 mmol/L (ref 135–145)
Total Bilirubin: 0.9 mg/dL (ref 0.3–1.2)
Total Protein: 7.6 g/dL (ref 6.5–8.1)

## 2022-03-12 LAB — ETHANOL: Alcohol, Ethyl (B): 291 mg/dL — ABNORMAL HIGH (ref <10)

## 2022-03-12 LAB — RAPID URINE DRUG SCREEN, HOSP PERFORMED
Amphetamines: NOT DETECTED
Barbiturates: NOT DETECTED
Benzodiazepines: NOT DETECTED
Cocaine: NOT DETECTED
Opiates: NOT DETECTED
Tetrahydrocannabinol: NOT DETECTED

## 2022-03-12 LAB — RESP PANEL BY RT-PCR (FLU A&B, COVID) ARPGX2
Influenza A by PCR: NEGATIVE
Influenza B by PCR: NEGATIVE
SARS Coronavirus 2 by RT PCR: NEGATIVE

## 2022-03-12 LAB — LIPASE, BLOOD: Lipase: 48 U/L (ref 11–51)

## 2022-03-12 IMAGING — CT CT HEAD W/O CM
4 series · 16 of 47 positions shown, 18 images · non-contrast
Comparison: MRI head [DATE].  CT head [DATE].

CLINICAL DATA: Head trauma, abnormal mental status (Age 18-64y)
etoh fall



[Series 2: head wo · axial · 0.47mm/px · z∈[-156,-36]mm · 7 of 33 slices shown, 9 images]
[im 5/33  brain]
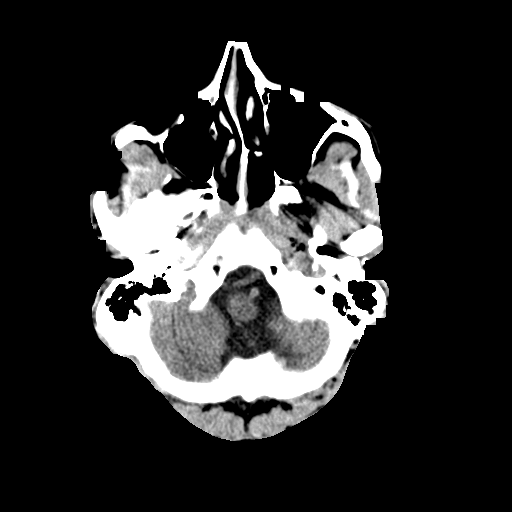
[im 5/33  bone]
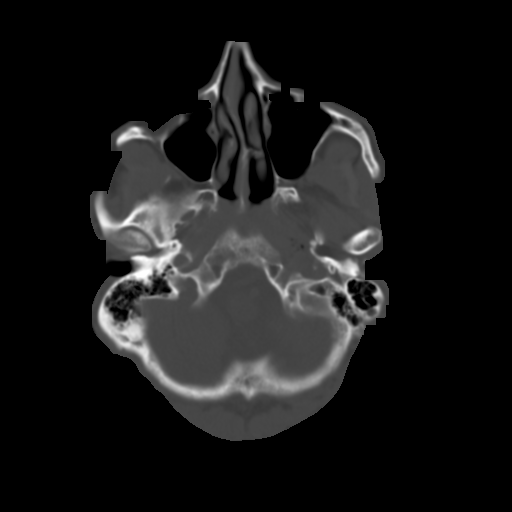
[im 9/33  brain]
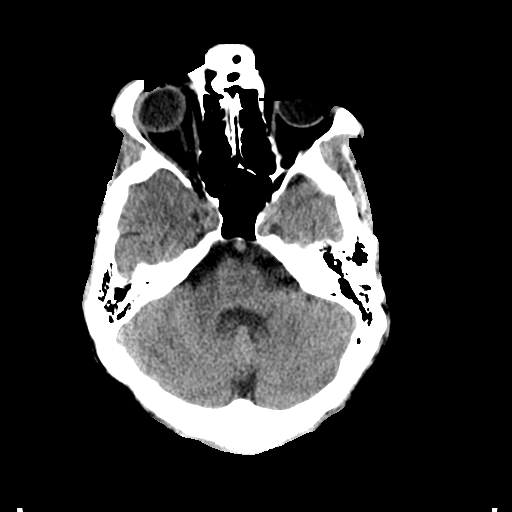
[im 13/33  brain]
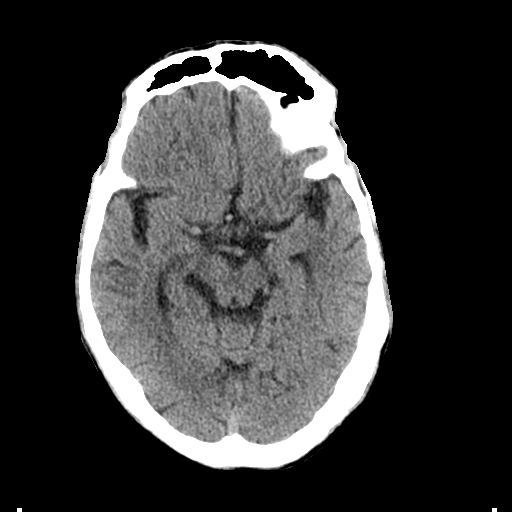
[im 17/33  brain]
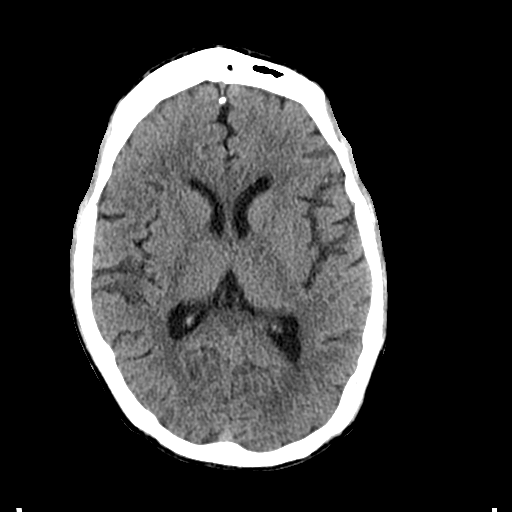
[im 21/33  brain]
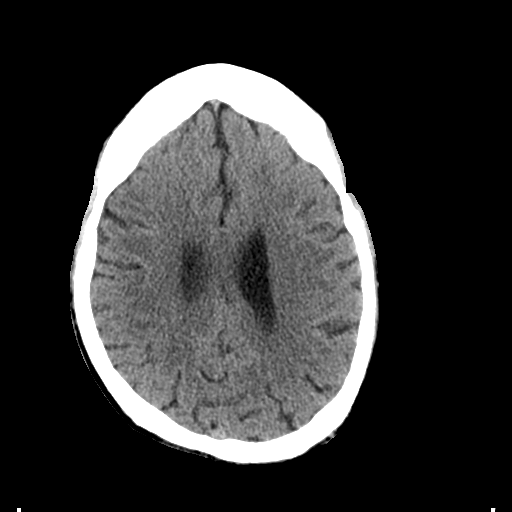
[im 21/33  bone]
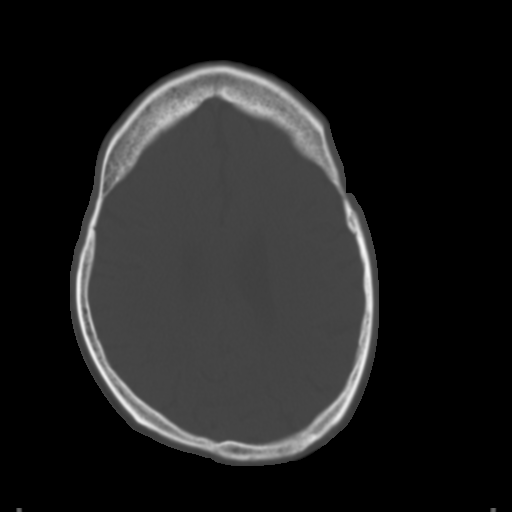
[im 25/33  brain]
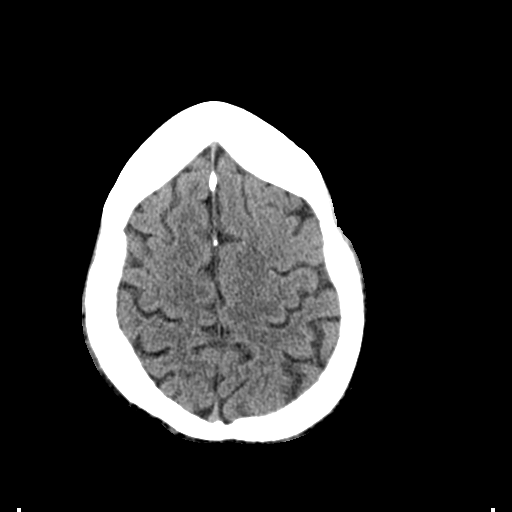
[im 29/33  brain]
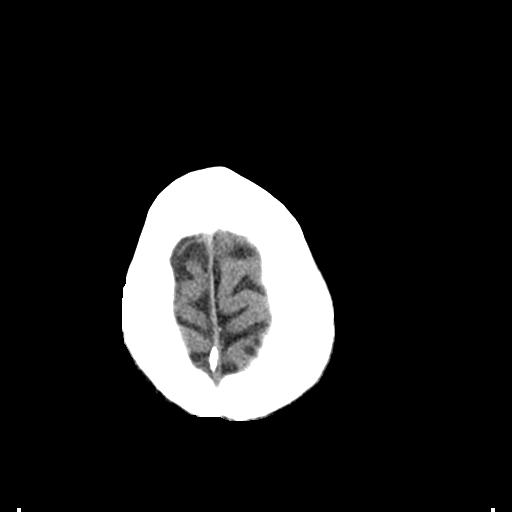

[Series 3: head bone · axial · 0.47mm/px · z∈[-160,-128]mm · 3 of 82 slices shown]
[im 9/82  bone]
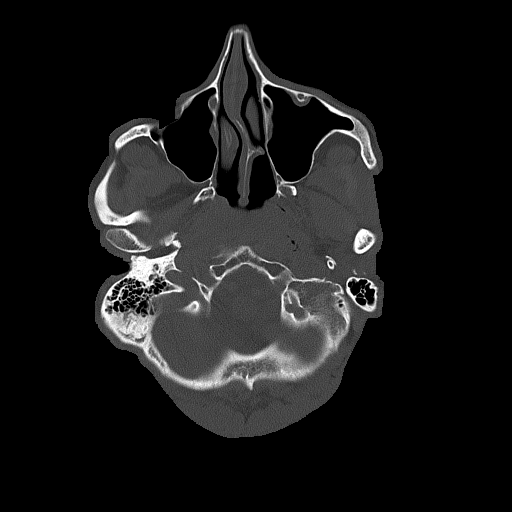
[im 17/82  bone]
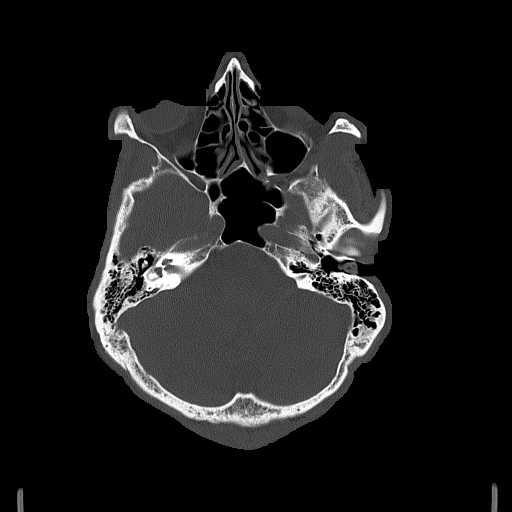
[im 25/82  bone]
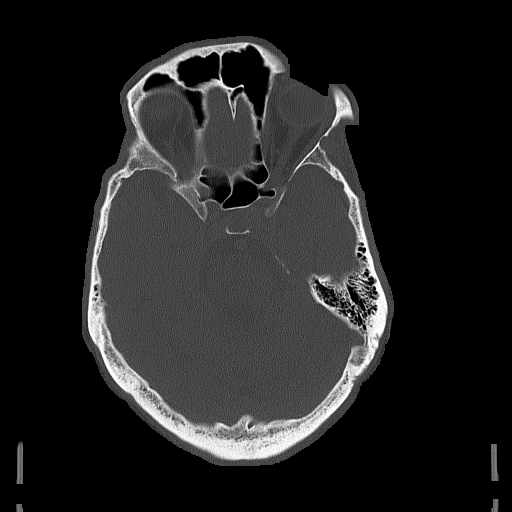

[Series 4: coronal soft tissue · coronal · 0.32mm/px · 3 of 73 slices shown]
[im 25/73  brain]
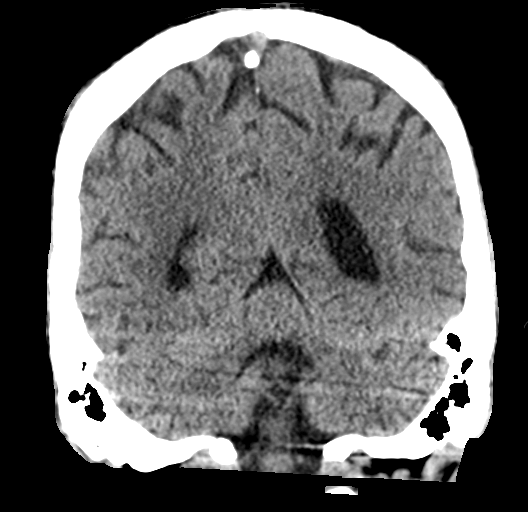
[im 33/73  brain]
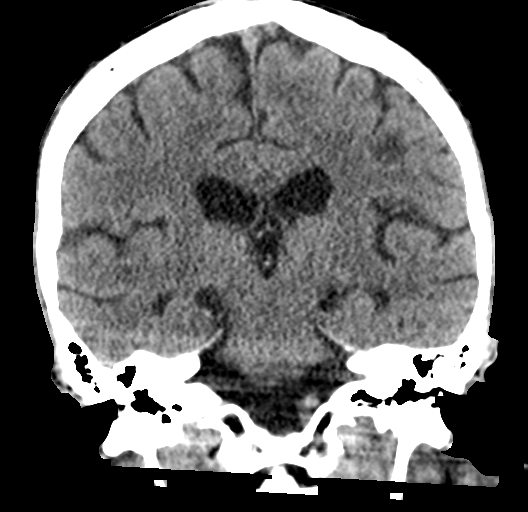
[im 41/73  brain]
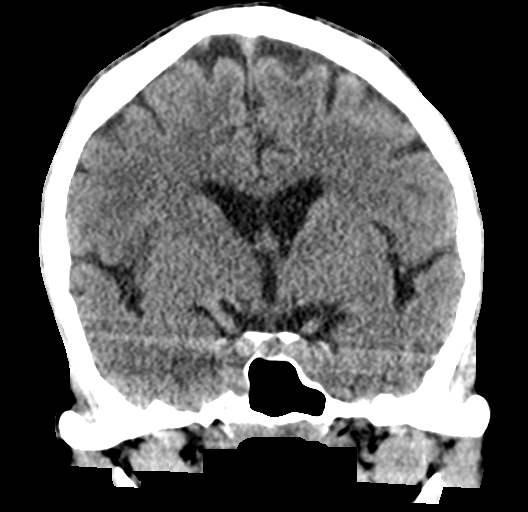

[Series 5: sagittal soft tissue · sagittal · 0.32mm/px · 3 of 54 slices shown]
[im 18/54  brain]
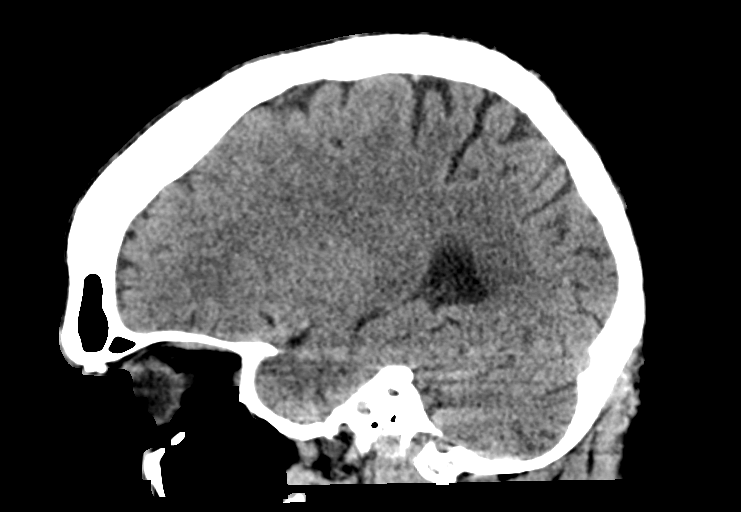
[im 27/54  brain]
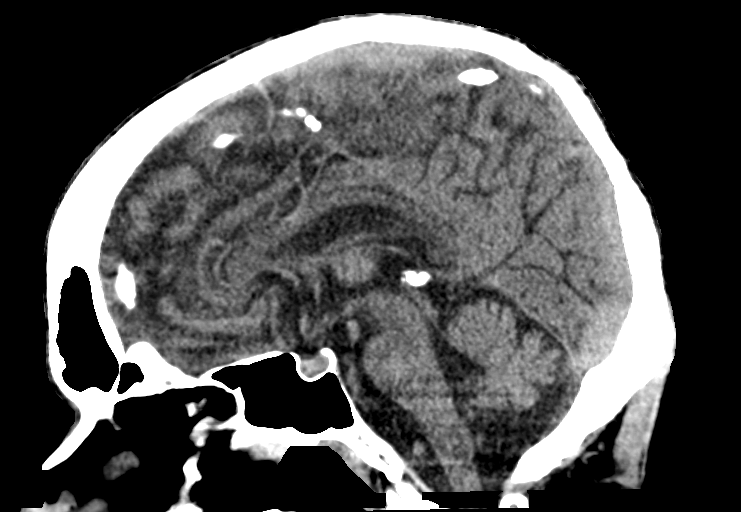
[im 36/54  brain]
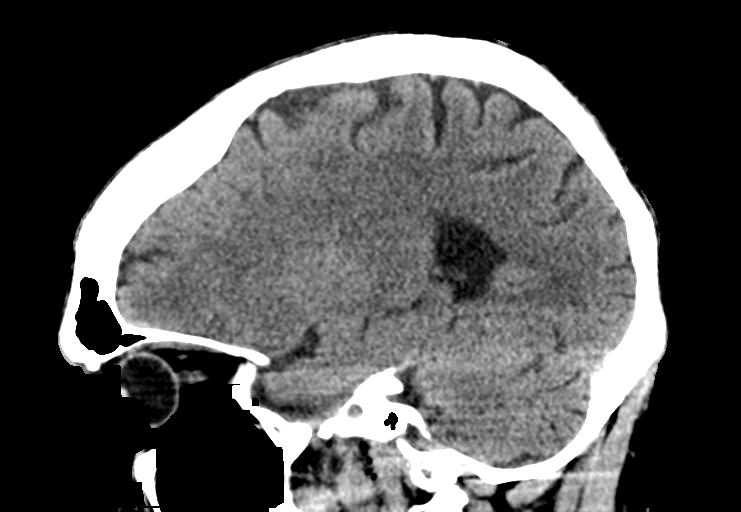

[16 of 47 positions shown; findings below may reference images not displayed]

FINDINGS: Brain: No evidence of acute infarction, hemorrhage, hydrocephalus,
extra-axial collection or mass lesion/mass effect. Age advanced
white matter disease, better characterized on prior MRI.

Vascular: No hyperdense vessel identified.

Skull: No acute fracture.

Sinuses/Orbits: Clear sinuses.  No acute orbital findings.

Other: No mastoid effusions
IMPRESSION: No evidence of acute intracranial abnormality.

## 2022-03-12 MED ORDER — THIAMINE HCL 100 MG/ML IJ SOLN: 100.0000 mg | Freq: Every day | INTRAMUSCULAR | Status: DC

## 2022-03-12 MED ORDER — LORAZEPAM 2 MG/ML IJ SOLN
1.0000 mg | INTRAMUSCULAR | Status: DC | PRN
  Administered 2022-03-13: 2 mg via INTRAVENOUS
  Filled 2022-03-12: qty 1

## 2022-03-12 MED ORDER — FOLIC ACID 1 MG PO TABS
1.0000 mg | ORAL_TABLET | Freq: Every day | ORAL | Status: DC
  Administered 2022-03-12 – 2022-03-13 (×2): 1 mg via ORAL
  Filled 2022-03-12 (×2): qty 1

## 2022-03-12 MED ORDER — FAMOTIDINE IN NACL 20-0.9 MG/50ML-% IV SOLN
20.0000 mg | Freq: Once | INTRAVENOUS | Status: AC
  Administered 2022-03-12: 20 mg via INTRAVENOUS
  Filled 2022-03-12: qty 50

## 2022-03-12 MED ORDER — THIAMINE HCL 100 MG PO TABS
100.0000 mg | ORAL_TABLET | Freq: Every day | ORAL | Status: DC
  Administered 2022-03-12 – 2022-03-13 (×2): 100 mg via ORAL
  Filled 2022-03-12 (×2): qty 1

## 2022-03-12 MED ORDER — LIDOCAINE VISCOUS HCL 2 % MT SOLN
15.0000 mL | Freq: Once | OROMUCOSAL | Status: AC
Start: 2022-03-12 — End: 2022-03-12
  Administered 2022-03-12: 15 mL via ORAL
  Filled 2022-03-12: qty 15

## 2022-03-12 MED ORDER — ADULT MULTIVITAMIN W/MINERALS CH
1.0000 | ORAL_TABLET | Freq: Every day | ORAL | Status: DC
  Administered 2022-03-12 – 2022-03-13 (×2): 1 via ORAL
  Filled 2022-03-12 (×2): qty 1

## 2022-03-12 MED ORDER — LORAZEPAM 1 MG PO TABS
1.0000 mg | ORAL_TABLET | ORAL | Status: DC | PRN
  Administered 2022-03-12: 1 mg via ORAL
  Administered 2022-03-12 (×2): 2 mg via ORAL
  Administered 2022-03-12 – 2022-03-13 (×3): 1 mg via ORAL
  Administered 2022-03-13: 3 mg via ORAL
  Filled 2022-03-12: qty 2
  Filled 2022-03-12: qty 1
  Filled 2022-03-12: qty 2
  Filled 2022-03-12 (×2): qty 1
  Filled 2022-03-12: qty 2
  Filled 2022-03-12 (×2): qty 1

## 2022-03-12 MED ORDER — SODIUM CHLORIDE 0.9 % IV BOLUS
1000.0000 mL | Freq: Once | INTRAVENOUS | Status: AC
  Administered 2022-03-12: 1000 mL via INTRAVENOUS

## 2022-03-12 MED ORDER — ALUM & MAG HYDROXIDE-SIMETH 200-200-20 MG/5ML PO SUSP
30.0000 mL | Freq: Once | ORAL | Status: AC
  Administered 2022-03-12: 30 mL via ORAL
  Filled 2022-03-12: qty 30

## 2022-03-12 NOTE — BH Assessment (Addendum)
Comprehensive Clinical Assessment (CCA) Note ? ?03/12/2022 ?DEMANTE BREINING ?[id number] ? ?Disposition: TTS completed. Per [facility] provider Oneida Alar, NP) patient meets criteria for overnight observation. Pending am psych evaluation.  ?  ?[place] ED from 03/12/2022 in [place] DEPT ED from 02/03/2022 in [place] DEPT ED to Hosp-Admission (Discharged) from 01/19/2022 in [facility]  ?C-SSRS RISK CATEGORY High Risk Error: Q3, 4, or 5 should not be populated when Q2 is No High Risk  ? ?  ? ? ?Chief Complaint:  ?Chief Complaint  ?Patient presents with  ? Abdominal Pain  ? Hallucinations  ? Alcohol Problem  ? Psychiatric Evaluation  ? ?Visit Diagnosis: Major Depressive Disorder, Recurrent, Severe, w/o psychotic features; Substance-Induced Mood Disorder; Alcohol use Disorder, Severe, Dependence; Alcohol use Disorder, Severe, Withdrawals ? ?Marlow Guard is a 36 y/o male found passed out after a seizure induced by withdrawal symptoms. His friends called EMS whom transported him to [facility] Patient with current suicidal ideations. States that the suicidal thoughts started last night, ?Because I just want the pain to go away?. He had a plan to ?drink myself to death?. He reports several suicide attempts by ?drinking myself to death?. He reports trying to drink himself to death last night. Denies a hx of self injurious behaviors. Stressors reported: ?I need to stay off of drugs and go into a long-term program?.  ? ?Current depressive symptoms: hopelessness, isolating self from others, worthlessness fatigue, tearful, angry/irritable, despondent, lack of motivation to complete task, poor grooming/bathing, and difficulty sleeping. He sleeps 5-6 hours per day. Appetite is good when he is drinking. However, when not drinking he doesn't eat at all. States, ?If I see or smell any food right now, I'm going to throw up?Marland Kitchen No reports weight loss  of 15 pounds in 1-2 months. ? ?Patient with current homicidal ideations toward ?certain people?. He does not remember the names of these people, refers to them as ?enemies?Marland Kitchen The reason he wants to harm them is because, ?They keep messing with my friends?. He has a plan to use his ?bare hands?. He reports a hx of aggressive and/or assaultive behaviors. States that he enjoys fighting people.  Patient is unsure if he is on probation/parole, denies that he is on probation. According to records, patient was recently released from prison.   ? ?Patient asked if he is experiencing AVH's. The auditory hallucinations tell him to keep drinking and never stop. Also, to hurt others. He has visual hallucinations of clowns.  He has experienced both AVH's today.  ? ?He does not have a therapist/psychiatrist. Patient says that he has sought inpatient treatment at [facility], however; doesn't' recall the date of his admission. Also reports receiving inpatient treatment at [facility]  ? ?Patient is single. No children. Currently homeless. States that he has been homeless 6 yrs. No support system. He has 4 brothers and a sister. Raised by both parents. He has a history of trauma-verbal and emotional. Highest level of education is H.S. and he is unemployed.  ? ?CCA Screening, Triage and Referral (STR) ? ?Patient Reported Information ?How did you hear about [place]? Other (Comment) ? ?What Is the Reason for Your Visit/Call Today?  ?Kennan Mcdole is a 36 y/o male found passed out after a seizure induced by withdrawal symptoms. His friends called EMS whom transported him to [facility]. Patient with current suicidal ideations. States that the suicidal thoughts started last night, ?Because I just want the pain to go  away?. He had a plan to ?drink myself to death?. He reports several suicide attempts by ?drinking myself to death?. He reports trying to drink himself to death last night. Denies a hx of self injurious behaviors. Stressors reported: ?I need to stay  off of drugs and go into a long-term program?.  ? ?Current depressive symptoms: hopelessness, isolating self from others, worthlessness fatigue, tearful, angry/irritable, despondent, lack of motivation to complete task, poor grooming/bathing, and difficulty sleeping. He sleeps 5-6 hours per day. Appetite is good when he is drinking. However, when not drinking he doesn't eat at all. States, ?If I see or smell any food right now, I'm going to throw up?Marland Kitchen No reports weight loss of 15 pounds in 1-2 months. ? ?Patient with current homicidal ideations toward ?certain people?. He does not remember the names of these people, refers to them as ?enemies?Marland Kitchen The reason he wants to harm them is because, ?They keep messing with my friends?. He has a plan to use his ?bare hands?. He reports a hx of aggressive and/or assaultive behaviors. States that he enjoys fighting people.  Patient is unsure if he is on probation/parole, denies that he is on probation. According to records, patient was recently released from prison.   ? ?Patient asked if he is experiencing AVH's. The auditory hallucinations tell him to keep drinking and never stop. Also, to hurt others. He has visual hallucinations of clowns.  He has experienced both AVH's today.  ? ?He does not have a therapist/psychiatrist. Patient says that he has sought inpatient treatment at [facility], however; doesn't' recall the date of his admission. Also reports receiving inpatient treatment at [facility].  ? ?Patient is single. No children. Currently homeless. States that he has been homeless 6 yrs. No support system. He has 4 brothers and a sister. Raised by both parents. He has a history of trauma-verbal and emotional. Highest level of education is H.S. and he is unemployed.  ? ?How Long Has This Been Causing You Problems? > than 6 months ? ?What Do You Feel Would Help You the Most Today? Treatment for Depression or other mood problem; Medication(s) ? ? ?Have You Recently Had Any Thoughts About  Hurting Yourself? No ? ?Are You Planning to Commit Suicide/Harm Yourself At This time? No ? ? ?Have you Recently Had Thoughts About [place]? Yes ? ?Are You Planning to Harm Someone at This Time? No ? ?Explanation: I violence in my head. ? ? ?Have You Used Any Alcohol or Drugs in the Past 24 Hours? No ? ?How Long Ago Did You Use Drugs or Alcohol? No data recorded ?What Did You Use and How Much? Pt reports, for the past two weeks drinking about six, fifths of Vodka. Pt then reports, for the past two days he's been drinking six bottles of wine per day. ? ? ?Do You Currently Have a Therapist/Psychiatrist? No ? ?Name of Therapist/Psychiatrist: No psychiatric providers at this time. ? ? ?Have You Been Recently Discharged From Any Office Practice or Programs? No ? ?Explanation of Discharge From Practice/Program: No data recorded ? ?  ?CCA Screening Triage Referral Assessment ?Type of Contact: Tele-Assessment ? ?Telemedicine Service Delivery: Telemedicine service delivery: This service was provided via telemedicine using a 2-way, interactive audio and video technology ? ?Is this Initial or Reassessment? Initial Assessment ? ?Date Telepsych consult ordered in [facility]:  03/12/22 ? ?Time Telepsych consult ordered in [facility]:  2252 ? ?Location of Assessment: [facility] ED ? ?Provider Location: [facility] ? ? ?  Collateral Involvement: Pt reports, he has no way to get in contact with his family. ? ? ?Does Patient Have a Stage manager Guardian? No data recorded ?Name and Contact of Legal Guardian: No data recorded ?If Minor and Not Living with Parent(s), Who has Custody? NA ? ?Is CPS involved or ever been involved? Never ? ?Is APS involved or ever been involved? Never ? ? ?Patient Determined To Be At Risk for Harm To Self or Others Based on Review of Patient Reported Information or Presenting Complaint? No ? ?Method: No data recorded ?Availability of Means: No data recorded ?Intent: No data  recorded ?Notification Required: No data recorded ?Additional Information for Danger to Others Potential: No data recorded ?Additional Comments for Danger to Others Potential: No data recorded ?Are There Guns or Other Weap

## 2022-03-12 NOTE — ED Triage Notes (Signed)
Pt BIBA from parking deck on [address]/ C/o abdominal/stomach pain and vomiting. Hx pancreatitis. States he is having auditory hallucinations telling him to drink himself to death and visual hallucinations of clowns. Drank 2 fifths of vodka. Wants voluntary psych. Does not take meds d/t ETOH interaction. ? ?104/70 ?HR 100 ?98% ?CBG 77 ?

## 2022-03-12 NOTE — ED Provider Notes (Signed)
?[facility]-EMERGENCY DEPT ?Provider Note ? ? ?CSN: [id number] ?Arrival date & time: 03/12/22  0735 ? ?  ? ?History ? ?Chief Complaint  ?Patient presents with  ? Abdominal Pain  ? Hallucinations  ? Alcohol Problem  ? ? ?Gary [name] is a 36 y.o. male. ? ? Patient as above with significant medical history as below, including medication noncompliance, HIV, epilepsy, chronic alcohol abuse, pancreatitis who presents to the ED with complaint of hallucinations, suicidal, intoxication ? ?Patient reports he has been drinking heavily for the past few days per not taking his medications.  He is having hallucinations that tell him to kill himself and to continue drinking.  He reports he is actively suicidal and has plan to jump off of a bridge or the parking garage when she leaves the emergency department.  Possibly jumping off an overpass if he can get there fast enough.  Reports last alcohol consumption was earlier this morning.  He drank a very large amount of alcohol, in excess of a gallon.  He does drink regularly.  Does report history of alcohol withdrawal seizure in the past.  Not taking his Lamictal because he is worried is going to interact with the alcohol that he is consuming.  Does report some nausea and vomiting.  He has blood around his nose and thinks he might of fallen at some point last night. ? ? ?Past Medical History: ?No date: Alcohol abuse ?No date: HIV (human immunodeficiency virus infection) (HCC) ?No date: Seizures (HCC) ?No date: Subdural hematoma (HCC) ? ?Past Surgical History: ?09/24/2016: INCISION AND DRAINAGE PERIRECTAL ABSCESS; N/A ?    Comment:  Procedure: IRRIGATION AND DEBRIDEMENT PERIRECTAL  ?             ABSCESS;  Surgeon: Ricarda Frameharles Woodham, MD;  Location: [facility]  ?             ORS;  Service: General;  Laterality: N/A; ?No date: none  ? ? ?The history is provided by the patient. No language interpreter was used.  ?Abdominal Pain ?Associated symptoms: nausea and vomiting    ?Associated symptoms: no chest pain, no chills, no cough, no fever, no hematuria and no shortness of breath   ?Alcohol Problem ?Associated symptoms include abdominal pain. Pertinent negatives include no chest pain, no headaches and no shortness of breath.  ? ?  ? ?Home Medications ?Prior to Admission medications   ?Medication Sig Start Date End Date Taking? Authorizing Provider  ?bictegravir-emtricitabine-tenofovir AF (BIKTARVY) 50-200-25 MG TABS tablet Take 1 tablet by mouth daily. 01/27/22   Danford, Earl Liteshristopher P, MD  ?chlordiazePOXIDE (LIBRIUM) 25 MG capsule 50mg  PO TID x 1D, then 25-50mg  PO BID X 1D, then 25-50mg  PO QD X 1D 02/04/22   Petrucelli, Samantha R, PA-C  ?chlorproMAZINE (THORAZINE) 10 MG tablet Take 1 tablet (10 mg total) by mouth 3 (three) times daily. 01/27/22   Danford, Earl Liteshristopher P, MD  ?folic acid (FOLVITE) 1 MG tablet Take 1 tablet (1 mg total) by mouth daily. 01/28/22   Danford, Earl Liteshristopher P, MD  ?gabapentin (NEURONTIN) 300 MG capsule Take 1 capsule (300 mg total) by mouth 3 (three) times daily. 01/27/22   Danford, Earl Liteshristopher P, MD  ?Lacosamide 100 MG TABS Take 1 tablet (100 mg total) by mouth 2 (two) times daily. 03/16/22   Danford, Earl Liteshristopher P, MD  ?lamoTRIgine (LAMICTAL) 25 MG tablet Take 1 tablet (25mg ) by mouth daily and  increase dose by 1 tablet (25 mg) each week. 01/27/22   Danford, Earl Liteshristopher P,  MD  ?famotidine (PEPCID) 20 MG tablet Take 1 tablet (20 mg total) by mouth 2 (two) times daily. ?Patient not taking: Reported on 06/01/2019 05/14/19 06/02/19  Benjiman Core, MD  ?   ? ?Allergies    ?Tegretol [carbamazepine] and Caffeine   ? ?Review of Systems   ?Review of Systems  ?Constitutional:  Negative for chills and fever.  ?HENT:  Negative for facial swelling and trouble swallowing.   ?Eyes:  Negative for photophobia and visual disturbance.  ?Respiratory:  Negative for cough and shortness of breath.   ?Cardiovascular:  Negative for chest pain and palpitations.  ?Gastrointestinal:   Positive for abdominal pain, nausea and vomiting.  ?Endocrine: Negative for polydipsia and polyuria.  ?Genitourinary:  Negative for difficulty urinating and hematuria.  ?Musculoskeletal:  Negative for gait problem and joint swelling.  ?Skin:  Negative for pallor and rash.  ?Neurological:  Negative for syncope and headaches.  ?Psychiatric/Behavioral:  Positive for hallucinations and suicidal ideas. Negative for agitation and confusion.   ? ?Physical Exam ?Updated Vital Signs ?BP 111/69 (BP Location: Left Arm)   Pulse (!) 109   Temp 98.5 ?F (36.9 ?C) (Oral)   Resp 17   Ht 6' (1.829 m)   Wt 70.8 kg   SpO2 99%   BMI 21.16 kg/m?  ?Physical Exam ?Vitals and nursing note reviewed.  ?Constitutional:   ?   General: He is not in acute distress. ?   Appearance: He is well-developed. He is not ill-appearing.  ?HENT:  ?   Head: Normocephalic. No raccoon eyes, Battle's sign, right periorbital erythema or left periorbital erythema.  ?   Jaw: There is normal jaw occlusion. No trismus.  ?   Comments: Dried blood to left naris ?   Right Ear: External ear normal.  ?   Left Ear: External ear normal.  ?   Mouth/Throat:  ?   Mouth: Mucous membranes are moist.  ?Eyes:  ?   General: No scleral icterus. ?   Conjunctiva/sclera:  ?   Right eye: Right conjunctiva is injected.  ?   Left eye: Left conjunctiva is injected.  ?Cardiovascular:  ?   Rate and Rhythm: Normal rate and regular rhythm.  ?   Pulses: Normal pulses.  ?   Heart sounds: Normal heart sounds.  ?Pulmonary:  ?   Effort: Pulmonary effort is normal. No respiratory distress.  ?   Breath sounds: Normal breath sounds.  ?Abdominal:  ?   General: Abdomen is flat.  ?   Palpations: Abdomen is soft.  ?   Tenderness: There is abdominal tenderness in the epigastric area. There is no guarding or rebound.  ?Musculoskeletal:     ?   General: Normal range of motion.  ?   Cervical back: Full passive range of motion without pain and normal range of motion.  ?   Right lower leg: No edema.  ?    Left lower leg: No edema.  ?Skin: ?   General: Skin is warm and dry.  ?   Capillary Refill: Capillary refill takes less than 2 seconds.  ?Neurological:  ?   Mental Status: He is alert and oriented to person, place, and time.  ?   GCS: GCS eye subscore is 4. GCS verbal subscore is 5. GCS motor subscore is 6.  ?   Cranial Nerves: Cranial nerves 2-12 are intact.  ?   Sensory: Sensation is intact.  ?   Motor: Tremor present.  ?   Coordination: Coordination is intact.  ?Psychiatric:     ?  Mood and Affect: Mood normal.     ?   Behavior: Behavior normal.  ? ? ?ED Results / Procedures / Treatments   ?Labs ?(all labs ordered are listed, but only abnormal results are displayed) ?Labs Reviewed  ?COMPREHENSIVE METABOLIC PANEL - Abnormal; Notable for the following components:  ?    Result Value  ? Creatinine, Ser 0.52 (*)   ? Calcium 8.5 (*)   ? AST 79 (*)   ? ALT 49 (*)   ? Anion gap 16 (*)   ? All other components within normal limits  ?ETHANOL - Abnormal; Notable for the following components:  ? Alcohol, Ethyl (B) 291 (*)   ? All other components within normal limits  ?CBC - Abnormal; Notable for the following components:  ? WBC 3.7 (*)   ? RBC 4.18 (*)   ? MCH 34.2 (*)   ? Platelets 104 (*)   ? All other components within normal limits  ?URINALYSIS, ROUTINE W REFLEX MICROSCOPIC - Abnormal; Notable for the following components:  ? Ketones, ur 20 (*)   ? All other components within normal limits  ?RESP PANEL BY RT-PCR (FLU A&B, COVID) [id number]  ?RAPID URINE DRUG SCREEN, HOSP PERFORMED  ?LIPASE, BLOOD  ?CK  ?LAMOTRIGINE LEVEL  ? ? ?EKG ?EKG Interpretation ? ?Date/Time:  Thursday Mar 12 2022 07:51:27 EDT ?Ventricular Rate:  104 ?PR Interval:  124 ?QRS Duration: 96 ?QT Interval:  341 ?QTC Calculation: 449 ?R Axis:   67 ?Text Interpretation: Sinus tachycardia Interpretation limited secondary to artifact Confirmed by Tanda Rockers ([id number]) on 03/12/2022 9:23:28 AM ? ?Radiology ?CT Head Wo Contrast ? ?Result Date: 03/12/2022 ?CLINICAL  DATA:  Head trauma, abnormal mental status (Age 21-64y) etoh fall EXAM: CT HEAD WITHOUT CONTRAST TECHNIQUE: Contiguous axial images were obtained from the base of the skull through the vertex without intravenous contrast.

## 2022-03-13 ENCOUNTER — Other Ambulatory Visit: Payer: Self-pay

## 2022-03-13 DIAGNOSIS — F1094 Alcohol use, unspecified with alcohol-induced mood disorder: Secondary | ICD-10-CM

## 2022-03-13 LAB — LAMOTRIGINE LEVEL: Lamotrigine Lvl: <1 ug/mL — ABNORMAL LOW (ref 2.0–20.0)

## 2022-03-13 MED ORDER — LOPERAMIDE HCL 2 MG PO CAPS
2.0000 mg | ORAL_CAPSULE | ORAL | Status: DC | PRN
  Administered 2022-03-13: 2 mg via ORAL
  Filled 2022-03-13: qty 1

## 2022-03-13 MED ORDER — BICTEGRAVIR-EMTRICITAB-TENOFOV 50-200-25 MG PO TABS
1.0000 | ORAL_TABLET | Freq: Every day | ORAL | Status: DC
Start: 2022-03-13 — End: 2022-03-13

## 2022-03-13 NOTE — ED Notes (Signed)
Pt d/c home per MD order. IVC Rescinded by EDP. Personal property returned. Discharge summary reviewed, pt verbalizes understanding. Ambulatory. Bus pass provided per pt request. Denies SI/HI. No s/s of acute distress noted at discharge.  ?

## 2022-03-13 NOTE — Consult Note (Signed)
Telepsych Consultation  ? ?Reason for Consult:  SI ?Referring Physician:  Tanda Rockers, DO ?Location of Patient:  [facility] ?Location of Provider: Behavioral Health TTS Department ? ?Patient Identification: Gary [name] ?MRN:  [id number] ?Principal Diagnosis: Alcohol-induced mood disorder (HCC) ?Diagnosis:  Principal Problem: ?  Alcohol-induced mood disorder (HCC) ?Active Problems: ?  Alcohol use disorder, severe, dependence (HCC) ?  Suicidal ideation ?  HIV disease (HCC) ? ? ?Total Time spent with patient: 20 minutes ? ?Subjective:   ?Gary [name] is a 36 y.o. male patient admitted with suicidal ideations. ? ?"Severe alcohol withdrawn and tummy issues".  ?Endorses daily ETOH intake for past 5 or 6 years. Reports attending detox in past, most recently 2 weeks ago. States he feels his current alcohol use is under control now; declines any substance abuse resources at this time. States he is "feeling better today, having horrible diarrhea". Denies any suicidal or homicidal thoughts, auditory or visual hallucinations. Denies any safety concerns at this time.  ? ?HPI:  Gary [name] is a 36 year old male patient with past psychiatric history of alcohol abuse, HIV, pancreatitis, polysubstance abuse, and substance induced disorder who presented to [facility] via ambulance from parking deck with chief complaints of abdominal pain and vomiting where he reported auditory hallucinations telling him to drink himself to death. He is currently homeless; receives services via [facility]. No current outpatient psychiatric provider. UDS-, BAL 291. PDMP reviewed, 01/27/22 Lacosamine 100 mg tablet. ? ?Past Psychiatric History: alcohol abuse, polysubstance abuse, and substance induced disorder ? ?Risk to Self:  pt denies ?Risk to Others:  pt denies ?Prior Inpatient Therapy:  yes ?Prior Outpatient Therapy:  yes ? ?Past Medical History:  ?Past Medical History:  ?Diagnosis Date  ? Alcohol abuse   ? HIV (human immunodeficiency virus infection)  (HCC)   ? Seizures (HCC)   ? Subdural hematoma (HCC)   ?  ?Past Surgical History:  ?Procedure Laterality Date  ? INCISION AND DRAINAGE PERIRECTAL ABSCESS N/A 09/24/2016  ? Procedure: IRRIGATION AND DEBRIDEMENT PERIRECTAL ABSCESS;  Surgeon: Ricarda Frame, MD;  Location: [facility] ORS;  Service: General;  Laterality: N/A;  ? none    ? ?Family History:  ?Family History  ?Problem Relation Age of Onset  ? Alcohol abuse Mother   ? Alcohol abuse Father   ? ?Family Psychiatric  History: not noted ?Social History:  ?Social History  ? ?Substance and Sexual Activity  ?Alcohol Use Yes  ? Comment: "I drink all I can"  ?   ?Social History  ? ?Substance and Sexual Activity  ?Drug Use Yes  ? Types: Methamphetamines, Marijuana  ? Comment: last used 04/10/2019  ?  ?Social History  ? ?Socioeconomic History  ? Marital status: Unknown  ?  Spouse name: Not on file  ? Number of children: Not on file  ? Years of education: Not on file  ? Highest education level: Not on file  ?Occupational History  ? Occupation: unemployed  ?Tobacco Use  ? Smoking status: Every Day  ?  Packs/day: 0.50  ?  Types: Cigarettes  ? Smokeless tobacco: Never  ? Tobacco comments:  ?  unable to smoke while incarcerated 6+ months 02/13/20  ?Vaping Use  ? Vaping Use: Never used  ?Substance and Sexual Activity  ? Alcohol use: Yes  ?  Comment: "I drink all I can"  ? Drug use: Yes  ?  Types: Methamphetamines, Marijuana  ?  Comment: last used 04/10/2019  ? Sexual activity: Yes  ?  Partners: Male, Male  ?  Comment: declined condoms  03/2021  ?Other Topics Concern  ? Not on file  ?Social History Narrative  ? "Currently living on the streets"  ? Independent at baseline  ? ?Social Determinants of Health  ? ?Financial Resource Strain: Not on file  ?Food Insecurity: Not on file  ?Transportation Needs: Not on file  ?Physical Activity: Not on file  ?Stress: Not on file  ?Social Connections: Not on file  ? ?Additional Social History: ?  ? ?Allergies:   ?Allergies  ?Allergen Reactions  ?  Tegretol [Carbamazepine] Other (See Comments)  ?  Caused vertigo for 2 days after taking it  ? Caffeine Palpitations  ? ? ?Labs:  ?Results for orders placed or performed during the hospital encounter of 03/12/22 (from the past 48 hour(s))  ?Rapid urine drug screen (hospital performed)     Status: None  ? Collection Time: 03/12/22  7:55 AM  ?Result Value Ref Range  ? Opiates NONE DETECTED NONE DETECTED  ? Cocaine NONE DETECTED NONE DETECTED  ? Benzodiazepines NONE DETECTED NONE DETECTED  ? Amphetamines NONE DETECTED NONE DETECTED  ? Tetrahydrocannabinol NONE DETECTED NONE DETECTED  ? Barbiturates NONE DETECTED NONE DETECTED  ?  Comment: (NOTE) ?DRUG SCREEN FOR MEDICAL PURPOSES ?ONLY.  IF CONFIRMATION IS NEEDED ?FOR ANY PURPOSE, NOTIFY LAB ?WITHIN 5 DAYS. ? ?LOWEST DETECTABLE LIMITS ?FOR URINE DRUG SCREEN ?Drug Class                     Cutoff (ng/mL) ?Amphetamine and metabolites    1000 ?Barbiturate and metabolites    200 ?Benzodiazepine                 200 ?Tricyclics and metabolites     300 ?Opiates and metabolites        300 ?Cocaine and metabolites        300 ?THC                            50 ?Performed at [facility], [address] ?[place], [place] [address] ?  ?Comprehensive metabolic panel     Status: Abnormal  ? Collection Time: 03/12/22  8:06 AM  ?Result Value Ref Range  ? Sodium 142 135 - 145 mmol/L  ? Potassium 4.0 3.5 - 5.1 mmol/L  ? Chloride 104 98 - 111 mmol/L  ? CO2 22 22 - 32 mmol/L  ? Glucose, Bld 78 70 - 99 mg/dL  ?  Comment: Glucose reference range applies only to samples taken after fasting for at least 8 hours.  ? BUN 9 6 - 20 mg/dL  ? Creatinine, Ser 0.52 (L) 0.61 - 1.24 mg/dL  ? Calcium 8.5 (L) 8.9 - 10.3 mg/dL  ? Total Protein 7.6 6.5 - 8.1 g/dL  ? Albumin 4.2 3.5 - 5.0 g/dL  ? AST 79 (H) 15 - 41 U/L  ? ALT 49 (H) 0 - 44 U/L  ? Alkaline Phosphatase 58 38 - 126 U/L  ? Total Bilirubin 0.9 0.3 - 1.2 mg/dL  ? GFR, Estimated >60 >60 mL/min  ?  Comment: (NOTE) ?Calculated  using the CKD-EPI Creatinine Equation (2021) ?  ? Anion gap 16 (H) 5 - 15  ?  Comment: Performed at [facility], [address] [place], [place] [address]  ?Ethanol     Status: Abnormal  ? Collection Time: 03/12/22  8:06 AM  ?Result Value Ref Range  ?  Alcohol, Ethyl (B) 291 (H) <10 mg/dL  ?  Comment: (NOTE) ?Lowest detectable limit for serum alcohol is 10 mg/dL. ? ?For medical purposes only. ?Performed at [facility], [address] ?[place], [place] [address] ?  ?cbc     Status: Abnormal  ? Collection Time: 03/12/22  8:06 AM  ?Result Value Ref Range  ? WBC 3.7 (L) 4.0 - 10.5 K/uL  ? RBC 4.18 (L) 4.22 - 5.81 MIL/uL  ? Hemoglobin 14.3 13.0 - 17.0 g/dL  ? HCT 41.5 39.0 - 52.0 %  ? MCV 99.3 80.0 - 100.0 fL  ? MCH 34.2 (H) 26.0 - 34.0 pg  ? MCHC 34.5 30.0 - 36.0 g/dL  ? RDW 14.2 11.5 - 15.5 %  ? Platelets 104 (L) 150 - 400 K/uL  ?  Comment: SPECIMEN CHECKED FOR CLOTS ?Immature Platelet Fraction may be ?clinically indicated, consider ?ordering this additional test ?[id number] ?REPEATED TO VERIFY ?PLATELET COUNT CONFIRMED BY SMEAR ?  ? nRBC 0.0 0.0 - 0.2 %  ?  Comment: Performed at [facility], [address] [place], [place] [address]  ?Urinalysis, Routine w reflex microscopic Urine, Clean Catch     Status: Abnormal  ? Collection Time: 03/12/22  8:12 AM  ?Result Value Ref Range  ? Color, Urine YELLOW YELLOW  ? APPearance CLEAR CLEAR  ? Specific Gravity, Urine 1.014 1.005 - 1.030  ? pH 5.0 5.0 - 8.0  ? Glucose, UA NEGATIVE NEGATIVE mg/dL  ? Hgb urine dipstick NEGATIVE NEGATIVE  ? Bilirubin Urine NEGATIVE NEGATIVE  ? Ketones, ur 20 (A) NEGATIVE mg/dL  ? Protein, ur NEGATIVE NEGATIVE mg/dL  ? Nitrite NEGATIVE NEGATIVE  ? Leukocytes,Ua NEGATIVE NEGATIVE  ?  Comment: Performed at [facility], [address] [place], [place] [address]  ?Resp Panel by RT-PCR (Flu A&B, Covid) Nasopharyngeal Swab     Status: None  ? Collection Time: 03/12/22  8:24  AM  ? Specimen: Nasopharyngeal Swab; Nasopharyngeal(NP) swabs in vial transport medium  ?Result Value Ref Range  ? SARS Coronavirus 2 by RT PCR NEGATIVE NEGATIVE  ?  Comment: (NOTE) ?SARS-CoV-2 target nuclei

## 2022-03-13 NOTE — ED Provider Notes (Signed)
Emergency Medicine Observation Re-evaluation Note ? ?Gary [name] is a 36 y.o. male, seen on rounds today.  Pt initially presented to the ED for complaints of Abdominal Pain, Hallucinations, Alcohol Problem, and Psychiatric Evaluation ?Currently, the patient is awaiting TTS evaluation/disposition. ? ?Physical Exam  ?BP 117/81   Pulse (!) 106   Temp 98 ?F (36.7 ?C) (Oral)   Resp 19   Ht 6' (1.829 m)   Wt 70.8 kg   SpO2 97%   BMI 21.16 kg/m?  ?Physical Exam ?General: Calm and cooperative.  ?Cardiac: Well perfused ?Lungs: Even, unlabored respirations ?Psych: Clinically sober, calm, and cooperative.  ? ?ED Course / MDM  ?EKG:EKG Interpretation ? ?Date/Time:  Thursday Mar 12 2022 16:00:58 EDT ?Ventricular Rate:  93 ?PR Interval:  132 ?QRS Duration: 92 ?QT Interval:  396 ?QTC Calculation: 492 ?R Axis:   65 ?Text Interpretation: Normal sinus rhythm Prolonged QT Abnormal ECG When compared with ECG of 12-Mar-2022 16:00, PREVIOUS ECG IS PRESENT prolonged QT new since previous Confirmed by Richardean Canal [phone]) on 03/12/2022 5:12:05 PM ? ?I have reviewed the labs performed to date as well as medications administered while in observation.  Recent changes in the last 24 hours include patient evaluated by TTS. He is now sober and denies active thoughts or self harm.  I had a discussion with the patient at bedside who confirms that he is no longer having thoughts of harming himself or others.  Will discharge home with area resources and information regarding psychiatry follow-up.  Discussed ED return precautions.  Patient is comfortable at the time of discharge.  Plan to resend IVC.  ? ?Plan  ?Current plan is for d/c. ?Gary [name] is under involuntary commitment. ?  ? ?  ?Maia Plan, MD ?03/13/22 1416 ? ?

## 2022-03-13 NOTE — ED Notes (Signed)
Tele psych machine to bedside  

## 2022-03-13 NOTE — ED Notes (Signed)
Patient was anxious but cooperative most of the shift. He was slept a couple of hours throughout the night.  ?

## 2022-04-02 ENCOUNTER — Other Ambulatory Visit (HOSPITAL_COMMUNITY): Payer: Self-pay

## 2022-04-16 ENCOUNTER — Other Ambulatory Visit: Payer: Self-pay

## 2022-04-16 ENCOUNTER — Emergency Department (HOSPITAL_COMMUNITY): Payer: Self-pay

## 2022-04-16 ENCOUNTER — Inpatient Hospital Stay (HOSPITAL_COMMUNITY)
Admission: EM | Admit: 2022-04-16 | Discharge: 2022-04-23 | DRG: 439 | Discharge status: Against Medical Advice | Payer: Self-pay | Attending: Internal Medicine | Admitting: Internal Medicine

## 2022-04-16 ENCOUNTER — Encounter (HOSPITAL_COMMUNITY): Payer: Self-pay | Admitting: Emergency Medicine

## 2022-04-16 DIAGNOSIS — E876 Hypokalemia: Secondary | ICD-10-CM | POA: Diagnosis present

## 2022-04-16 DIAGNOSIS — D638 Anemia in other chronic diseases classified elsewhere: Secondary | ICD-10-CM | POA: Diagnosis present

## 2022-04-16 DIAGNOSIS — Y907 Blood alcohol level of 200-239 mg/100 ml: Secondary | ICD-10-CM | POA: Diagnosis present

## 2022-04-16 DIAGNOSIS — R7401 Elevation of levels of liver transaminase levels: Secondary | ICD-10-CM | POA: Diagnosis present

## 2022-04-16 DIAGNOSIS — N179 Acute kidney failure, unspecified: Secondary | ICD-10-CM | POA: Diagnosis present

## 2022-04-16 DIAGNOSIS — R188 Other ascites: Secondary | ICD-10-CM | POA: Diagnosis present

## 2022-04-16 DIAGNOSIS — Z811 Family history of alcohol abuse and dependence: Secondary | ICD-10-CM

## 2022-04-16 DIAGNOSIS — F17210 Nicotine dependence, cigarettes, uncomplicated: Secondary | ICD-10-CM | POA: Diagnosis present

## 2022-04-16 DIAGNOSIS — Z888 Allergy status to other drugs, medicaments and biological substances status: Secondary | ICD-10-CM

## 2022-04-16 DIAGNOSIS — B20 Human immunodeficiency virus [HIV] disease: Secondary | ICD-10-CM | POA: Diagnosis present

## 2022-04-16 DIAGNOSIS — D6959 Other secondary thrombocytopenia: Secondary | ICD-10-CM | POA: Diagnosis present

## 2022-04-16 DIAGNOSIS — E8729 Other acidosis: Principal | ICD-10-CM | POA: Diagnosis present

## 2022-04-16 DIAGNOSIS — E872 Acidosis, unspecified: Secondary | ICD-10-CM | POA: Diagnosis present

## 2022-04-16 DIAGNOSIS — R1084 Generalized abdominal pain: Secondary | ICD-10-CM

## 2022-04-16 DIAGNOSIS — D61818 Other pancytopenia: Secondary | ICD-10-CM | POA: Diagnosis present

## 2022-04-16 DIAGNOSIS — R1901 Right upper quadrant abdominal swelling, mass and lump: Secondary | ICD-10-CM

## 2022-04-16 DIAGNOSIS — Z5902 Unsheltered homelessness: Secondary | ICD-10-CM

## 2022-04-16 DIAGNOSIS — Z21 Asymptomatic human immunodeficiency virus [HIV] infection status: Secondary | ICD-10-CM | POA: Diagnosis present

## 2022-04-16 DIAGNOSIS — E871 Hypo-osmolality and hyponatremia: Secondary | ICD-10-CM | POA: Diagnosis not present

## 2022-04-16 DIAGNOSIS — F1020 Alcohol dependence, uncomplicated: Secondary | ICD-10-CM | POA: Diagnosis present

## 2022-04-16 DIAGNOSIS — K852 Alcohol induced acute pancreatitis without necrosis or infection: Secondary | ICD-10-CM

## 2022-04-16 DIAGNOSIS — Z79899 Other long term (current) drug therapy: Secondary | ICD-10-CM

## 2022-04-16 DIAGNOSIS — Z87898 Personal history of other specified conditions: Secondary | ICD-10-CM

## 2022-04-16 DIAGNOSIS — K3189 Other diseases of stomach and duodenum: Secondary | ICD-10-CM | POA: Diagnosis present

## 2022-04-16 DIAGNOSIS — K766 Portal hypertension: Secondary | ICD-10-CM | POA: Diagnosis present

## 2022-04-16 DIAGNOSIS — R000 Tachycardia, unspecified: Secondary | ICD-10-CM | POA: Diagnosis present

## 2022-04-16 DIAGNOSIS — G40909 Epilepsy, unspecified, not intractable, without status epilepticus: Secondary | ICD-10-CM | POA: Diagnosis present

## 2022-04-16 DIAGNOSIS — R9431 Abnormal electrocardiogram [ECG] [EKG]: Secondary | ICD-10-CM | POA: Diagnosis present

## 2022-04-16 DIAGNOSIS — R933 Abnormal findings on diagnostic imaging of other parts of digestive tract: Secondary | ICD-10-CM

## 2022-04-16 DIAGNOSIS — F1090 Alcohol use, unspecified, uncomplicated: Secondary | ICD-10-CM

## 2022-04-16 DIAGNOSIS — Z720 Tobacco use: Secondary | ICD-10-CM | POA: Diagnosis present

## 2022-04-16 DIAGNOSIS — F10239 Alcohol dependence with withdrawal, unspecified: Secondary | ICD-10-CM | POA: Diagnosis not present

## 2022-04-16 DIAGNOSIS — F10229 Alcohol dependence with intoxication, unspecified: Secondary | ICD-10-CM | POA: Diagnosis present

## 2022-04-16 DIAGNOSIS — Z5329 Procedure and treatment not carried out because of patient's decision for other reasons: Secondary | ICD-10-CM | POA: Diagnosis present

## 2022-04-16 DIAGNOSIS — I959 Hypotension, unspecified: Secondary | ICD-10-CM | POA: Diagnosis present

## 2022-04-16 DIAGNOSIS — Z800 Family history of malignant neoplasm of digestive organs: Secondary | ICD-10-CM

## 2022-04-16 DIAGNOSIS — D649 Anemia, unspecified: Secondary | ICD-10-CM

## 2022-04-16 HISTORY — DX: Alcohol induced acute pancreatitis without necrosis or infection: K85.20

## 2022-04-16 LAB — CBC WITH DIFFERENTIAL/PLATELET
Abs Immature Granulocytes: 0.03 10*3/uL (ref 0.00–0.07)
Basophils Absolute: 0 10*3/uL (ref 0.0–0.1)
Basophils Relative: 1 %
Eosinophils Absolute: 0 10*3/uL (ref 0.0–0.5)
Eosinophils Relative: 0 %
HCT: 36.6 % — ABNORMAL LOW (ref 39.0–52.0)
Hemoglobin: 12 g/dL — ABNORMAL LOW (ref 13.0–17.0)
Immature Granulocytes: 1 %
Lymphocytes Relative: 10 %
Lymphs Abs: 0.7 10*3/uL (ref 0.7–4.0)
MCH: 34.4 pg — ABNORMAL HIGH (ref 26.0–34.0)
MCHC: 32.8 g/dL (ref 30.0–36.0)
MCV: 104.9 fL — ABNORMAL HIGH (ref 80.0–100.0)
Monocytes Absolute: 0.4 10*3/uL (ref 0.1–1.0)
Monocytes Relative: 6 %
Neutro Abs: 5.4 10*3/uL (ref 1.7–7.7)
Neutrophils Relative %: 82 %
Platelets: 170 10*3/uL (ref 150–400)
RBC: 3.49 MIL/uL — ABNORMAL LOW (ref 4.22–5.81)
RDW: 14.2 % (ref 11.5–15.5)
WBC: 6.5 10*3/uL (ref 4.0–10.5)
nRBC: 0 % (ref 0.0–0.2)

## 2022-04-16 LAB — COMPREHENSIVE METABOLIC PANEL
ALT: 37 U/L (ref 0–44)
AST: 50 U/L — ABNORMAL HIGH (ref 15–41)
Albumin: 4.2 g/dL (ref 3.5–5.0)
Alkaline Phosphatase: 47 U/L (ref 38–126)
Anion gap: 29 — ABNORMAL HIGH (ref 5–15)
BUN: 12 mg/dL (ref 6–20)
CO2: 14 mmol/L — ABNORMAL LOW (ref 22–32)
Calcium: 8.4 mg/dL — ABNORMAL LOW (ref 8.9–10.3)
Chloride: 97 mmol/L — ABNORMAL LOW (ref 98–111)
Creatinine, Ser: 1.28 mg/dL — ABNORMAL HIGH (ref 0.61–1.24)
GFR, Estimated: >60 mL/min (ref >60)
Glucose, Bld: 152 mg/dL — ABNORMAL HIGH (ref 70–99)
Potassium: 4 mmol/L (ref 3.5–5.1)
Sodium: 140 mmol/L (ref 135–145)
Total Bilirubin: 1.1 mg/dL (ref 0.3–1.2)
Total Protein: 7 g/dL (ref 6.5–8.1)

## 2022-04-16 LAB — URINALYSIS, ROUTINE W REFLEX MICROSCOPIC
Bacteria, UA: NONE SEEN
Bilirubin Urine: NEGATIVE
Glucose, UA: 50 mg/dL — AB
Hgb urine dipstick: NEGATIVE
Ketones, ur: 80 mg/dL — AB
Leukocytes,Ua: NEGATIVE
Nitrite: NEGATIVE
Protein, ur: 30 mg/dL — AB
Specific Gravity, Urine: 1.024 (ref 1.005–1.030)
pH: 5 (ref 5.0–8.0)

## 2022-04-16 LAB — I-STAT CHEM 8, ED
BUN: 13 mg/dL (ref 6–20)
Calcium, Ion: 0.98 mmol/L — ABNORMAL LOW (ref 1.15–1.40)
Chloride: 99 mmol/L (ref 98–111)
Creatinine, Ser: 1.4 mg/dL — ABNORMAL HIGH (ref 0.61–1.24)
Glucose, Bld: 148 mg/dL — ABNORMAL HIGH (ref 70–99)
HCT: 38 % — ABNORMAL LOW (ref 39.0–52.0)
Hemoglobin: 12.9 g/dL — ABNORMAL LOW (ref 13.0–17.0)
Potassium: 4 mmol/L (ref 3.5–5.1)
Sodium: 137 mmol/L (ref 135–145)
TCO2: 16 mmol/L — ABNORMAL LOW (ref 22–32)

## 2022-04-16 LAB — MAGNESIUM: Magnesium: 2.1 mg/dL (ref 1.7–2.4)

## 2022-04-16 LAB — ETHANOL: Alcohol, Ethyl (B): 203 mg/dL — ABNORMAL HIGH (ref <10)

## 2022-04-16 LAB — BASIC METABOLIC PANEL
Anion gap: 20 — ABNORMAL HIGH (ref 5–15)
BUN: 9 mg/dL (ref 6–20)
CO2: 16 mmol/L — ABNORMAL LOW (ref 22–32)
Calcium: 8 mg/dL — ABNORMAL LOW (ref 8.9–10.3)
Chloride: 104 mmol/L (ref 98–111)
Creatinine, Ser: 0.94 mg/dL (ref 0.61–1.24)
GFR, Estimated: >60 mL/min (ref >60)
Glucose, Bld: 102 mg/dL — ABNORMAL HIGH (ref 70–99)
Potassium: 4.6 mmol/L (ref 3.5–5.1)
Sodium: 140 mmol/L (ref 135–145)

## 2022-04-16 LAB — RAPID URINE DRUG SCREEN, HOSP PERFORMED
Amphetamines: NOT DETECTED
Barbiturates: NOT DETECTED
Benzodiazepines: NOT DETECTED
Cocaine: NOT DETECTED
Opiates: NOT DETECTED
Tetrahydrocannabinol: NOT DETECTED

## 2022-04-16 LAB — LACTIC ACID, PLASMA
Lactic Acid, Venous: 6.3 mmol/L (ref 0.5–1.9)
Lactic Acid, Venous: 4.6 mmol/L (ref 0.5–1.9)

## 2022-04-16 LAB — TROPONIN I (HIGH SENSITIVITY)
Troponin I (High Sensitivity): 5 ng/L (ref <18)
Troponin I (High Sensitivity): 7 ng/L (ref <18)

## 2022-04-16 LAB — LIPASE, BLOOD: Lipase: 628 U/L — ABNORMAL HIGH (ref 11–51)

## 2022-04-16 IMAGING — CT CT HEAD W/O CM
4 series · 14 of 47 positions shown, 16 images · non-contrast
Comparison: [DATE] CT head [DATE] CT cervical spine

CLINICAL DATA: Found down



[Series 3: head wo · axial · 0.46mm/px · z∈[-118,-3]mm · 7 of 31 slices shown, 9 images]
[im 4/31  brain]
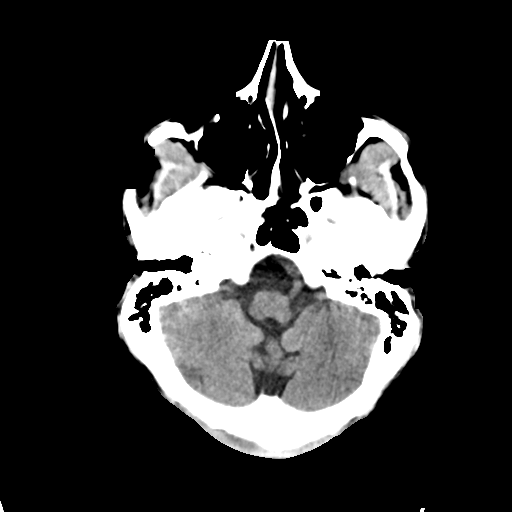
[im 4/31  bone]
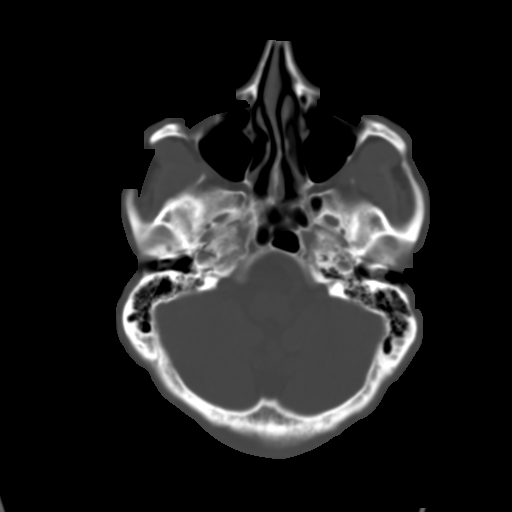
[im 8/31  brain]
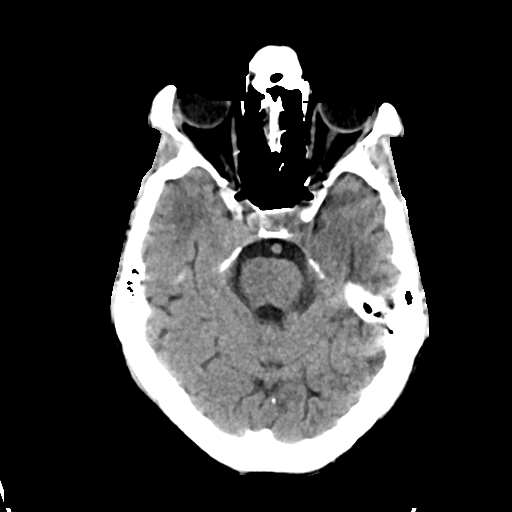
[im 12/31  brain]
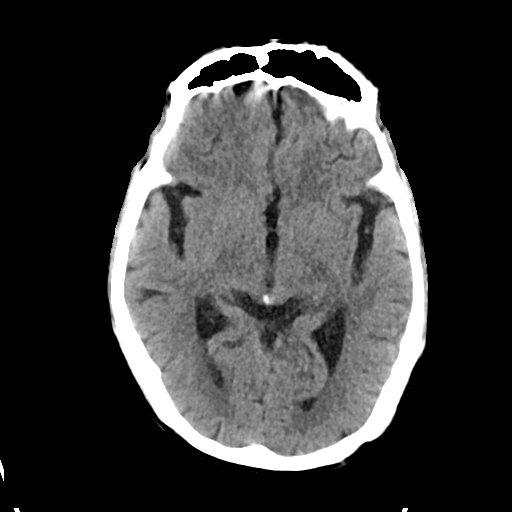
[im 16/31  brain]
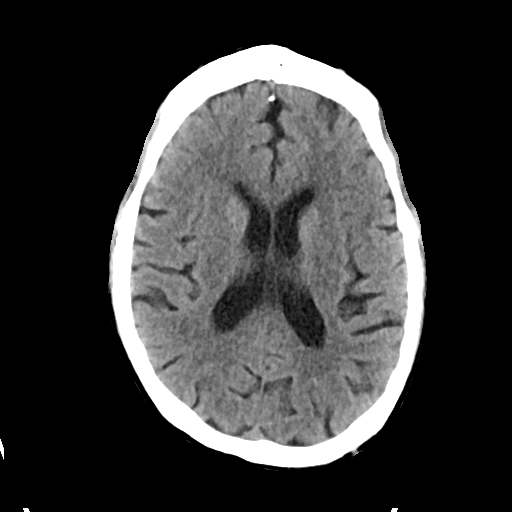
[im 19/31  brain]
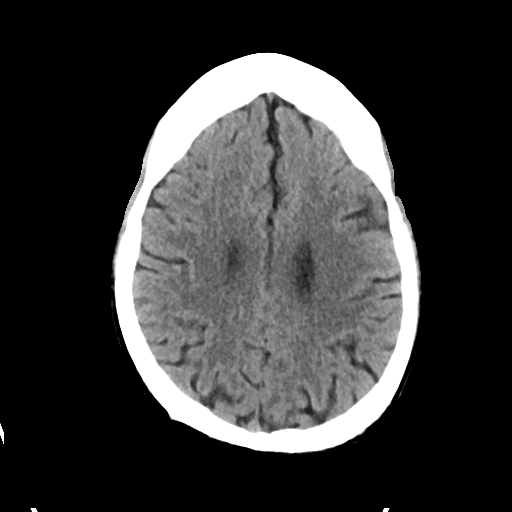
[im 19/31  bone]
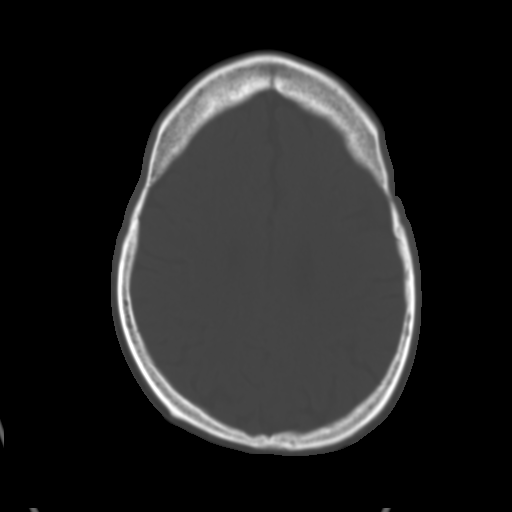
[im 23/31  brain]
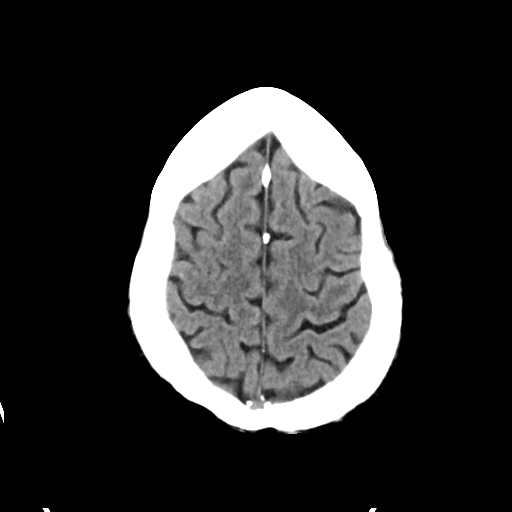
[im 27/31  brain]
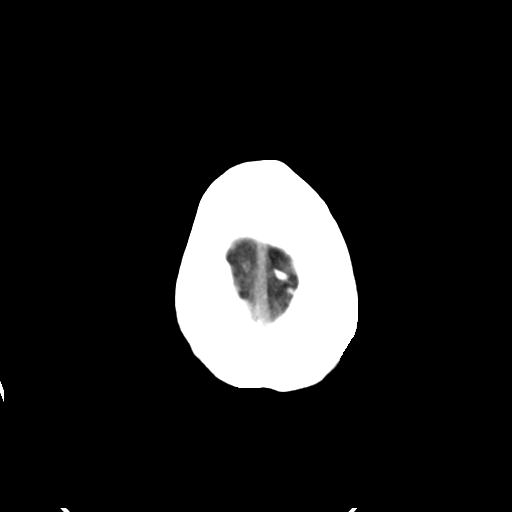

[Series 4: head bone · axial · 0.46mm/px · 1 of 77 slices shown]
[im 8/77  bone]
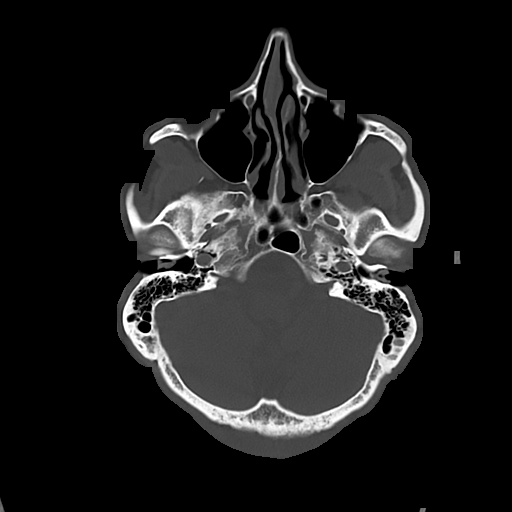

[Series 5: cor soft · coronal · 0.32mm/px · 3 of 80 slices shown]
[im 27/80  brain]
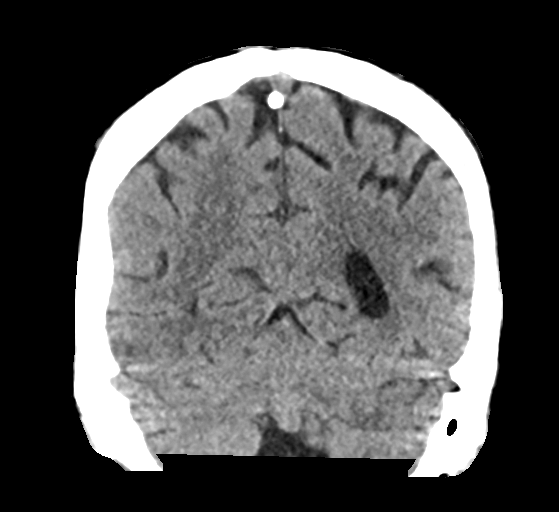
[im 36/80  brain]
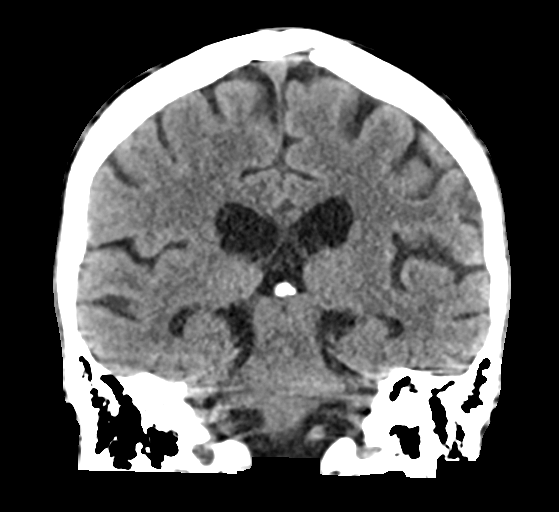
[im 44/80  brain]
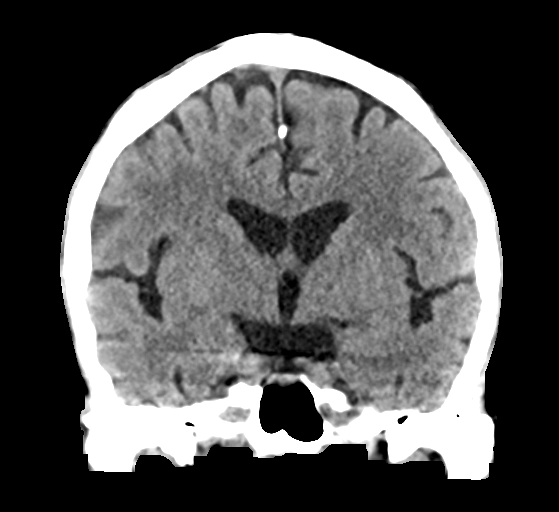

[Series 6: sag soft · sagittal · 0.29mm/px · 3 of 60 slices shown]
[im 20/60  brain]
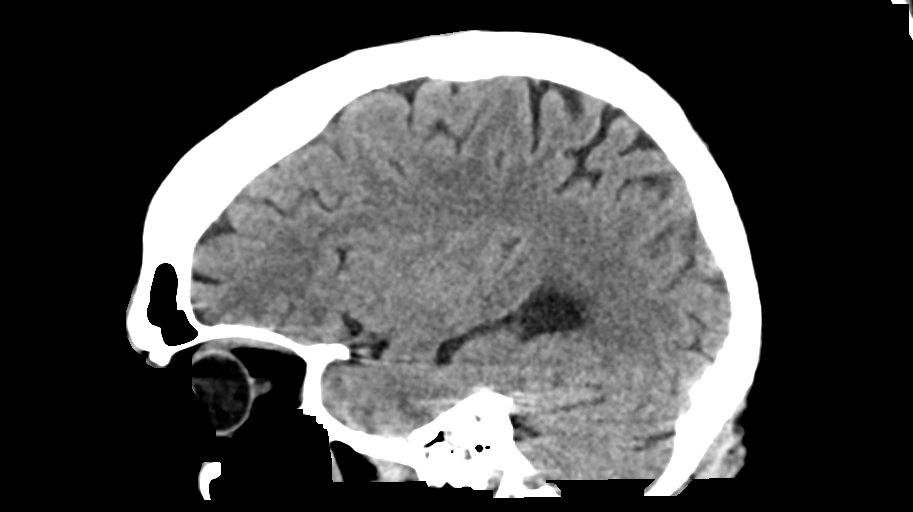
[im 30/60  brain]
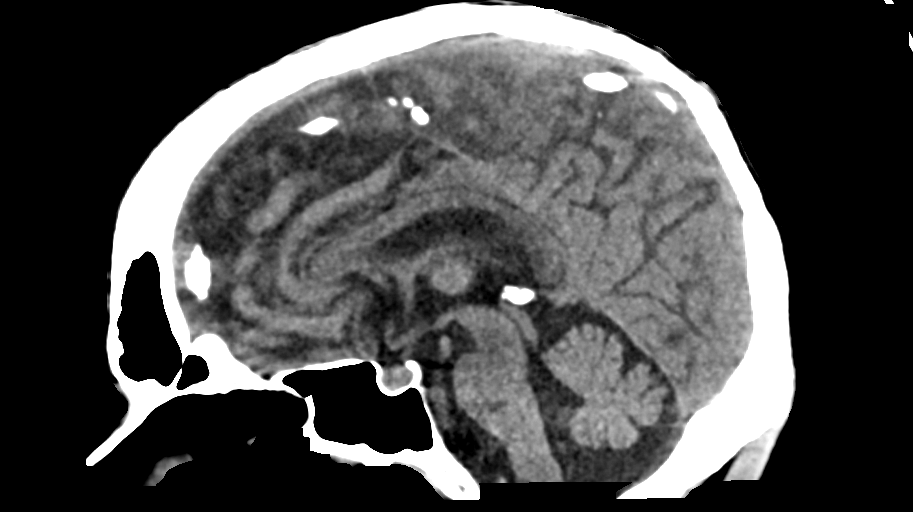
[im 40/60  brain]
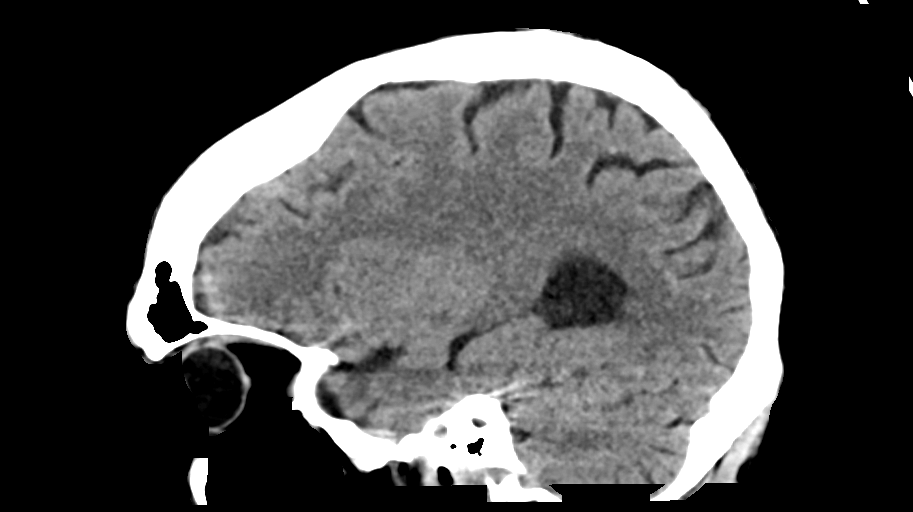

[14 of 47 positions shown; findings below may reference images not displayed]

FINDINGS: CT HEAD FINDINGS

Brain: No evidence of acute infarction, hemorrhage, cerebral edema,
mass, mass effect, or midline shift. No hydrocephalus or extra-axial
fluid collection.

Vascular: No hyperdense vessel.

Skull: Normal. Negative for fracture or focal lesion.

Sinuses/Orbits: No acute finding.

Other: The mastoid air cells are well aerated.

CT CERVICAL SPINE FINDINGS

Alignment: Trace retrolisthesis of C3 on C4, C4 on C5, and C6 on C7,
which appear unchanged compared to [DATE]. Normal facet
alignment.

Skull base and vertebrae: No acute fracture or suspicious osseous
lesion.

Soft tissues and spinal canal: No prevertebral fluid or swelling. No
visible canal hematoma.

Disc levels: Mild degenerative changes without high-grade spinal
canal stenosis or neural foraminal narrowing.

Upper chest: No focal pulmonary opacity or pleural effusion.

Other: None.
IMPRESSION: 1.  No acute intracranial process.
2.  No acute fracture or traumatic listhesis in the cervical spine.

## 2022-04-16 IMAGING — DX DG CHEST 1V PORT
1 series · 1 of 1 positions shown · non-contrast
Comparison: Chest radiograph [DATE]

CLINICAL DATA: Alcohol withdrawal.  Epilepsy.

EXAM:
PORTABLE CHEST 1 VIEW

[chest ap]
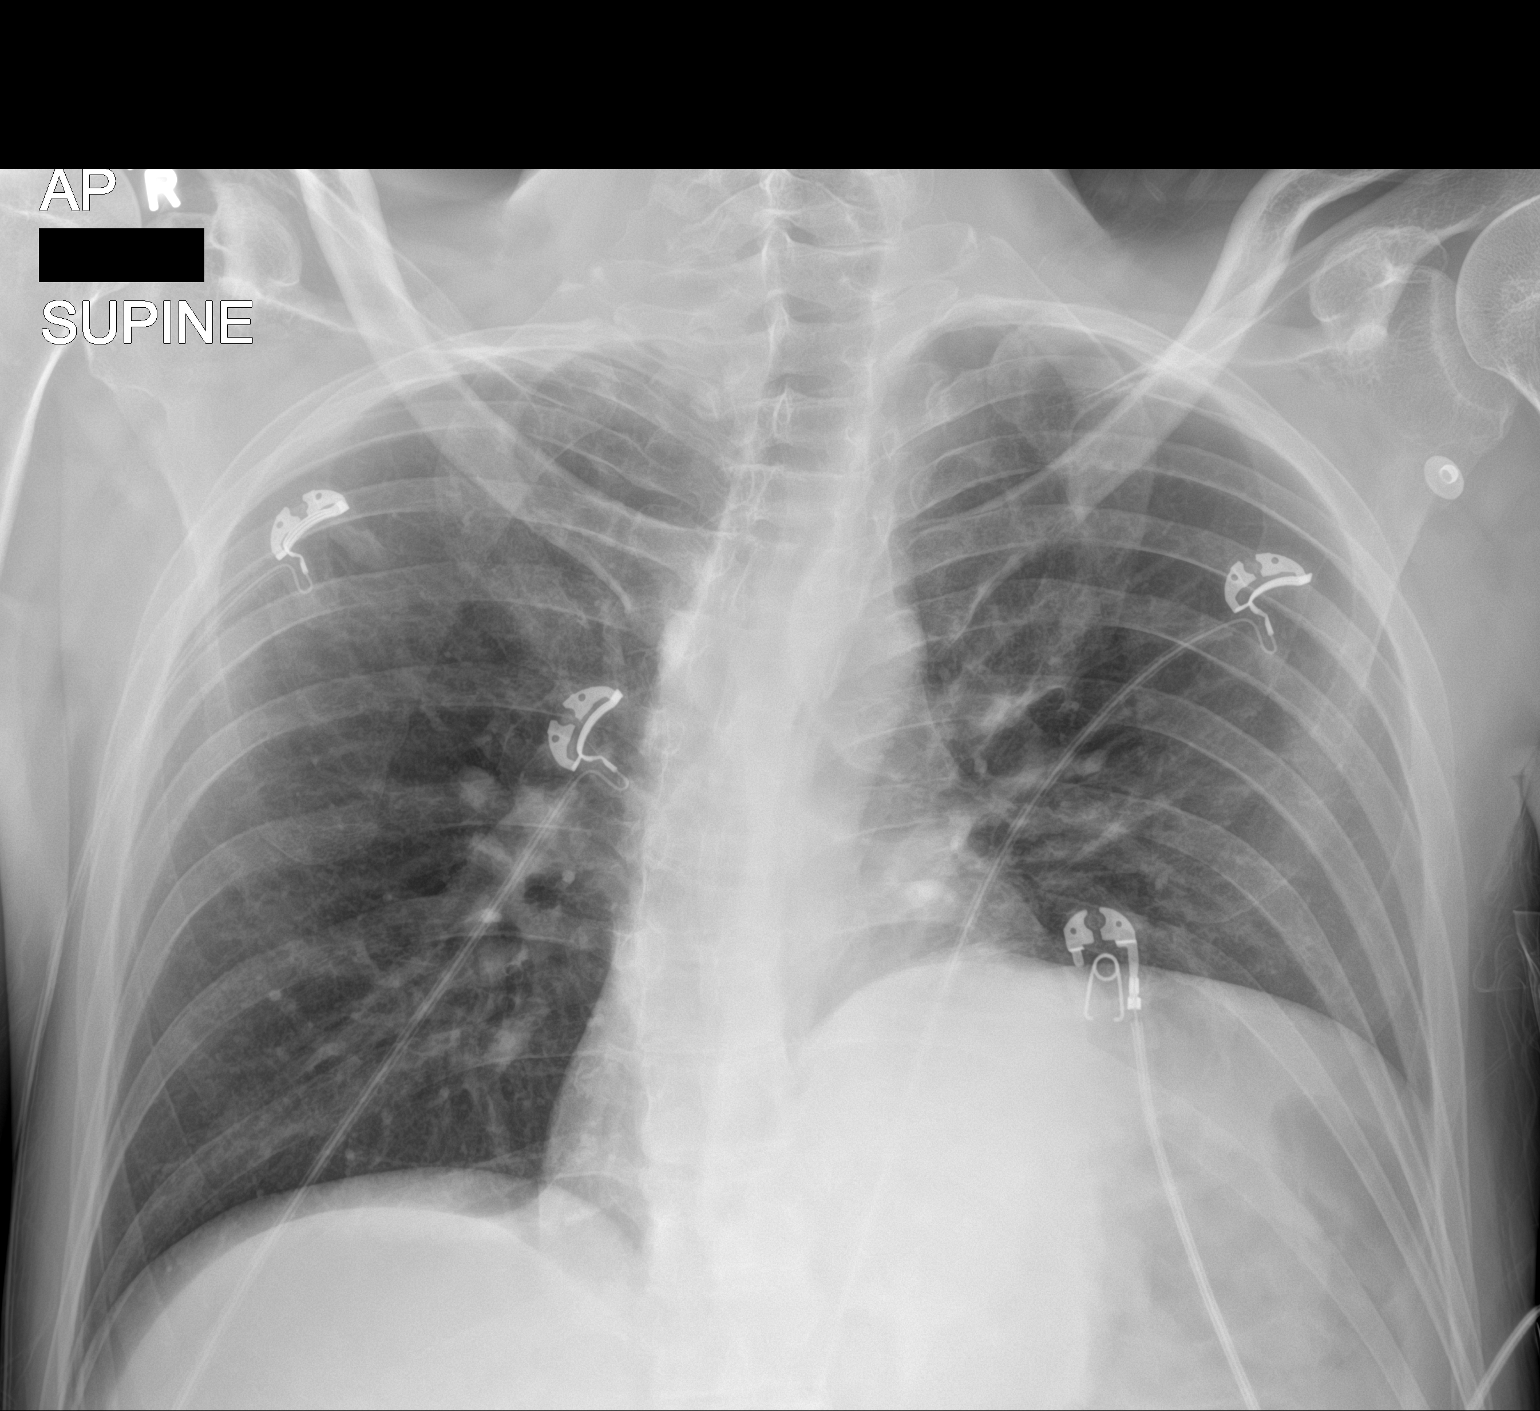

[1 of 1 positions shown; findings below may reference images not displayed]

FINDINGS: Chronic elevation of the left hemidiaphragm. Otherwise, the lungs
are clear. Heart size is within normal limits and stable. Trachea is
midline. Negative for a pneumothorax. No acute bone abnormality.
IMPRESSION: No active disease.

## 2022-04-16 IMAGING — CT CT ABD-PELV W/ CM
2 of 5 series · 13 of 46 positions shown, 15 images · IV contrast (agent unspecified)
Comparison: AP chest [DATE];

CLINICAL DATA: Acute nonlocalized abdominal pain. Nausea, vomiting,
and diarrhea onset a few weeks ago. Regular heavy alcohol use. Found
down coronal week and hypotensive.

EXAM:
CT ABDOMEN AND PELVIS WITH CONTRAST
TECHNIQUE: Multidetector CT imaging of the abdomen and pelvis was performed
using the standard protocol following bolus administration of
intravenous contrast.

[Series 6: abdomen 3.0 mpr cor · coronal · 0.79mm/px · 3 of 98 slices shown]
[im 33/98  soft-tissue]
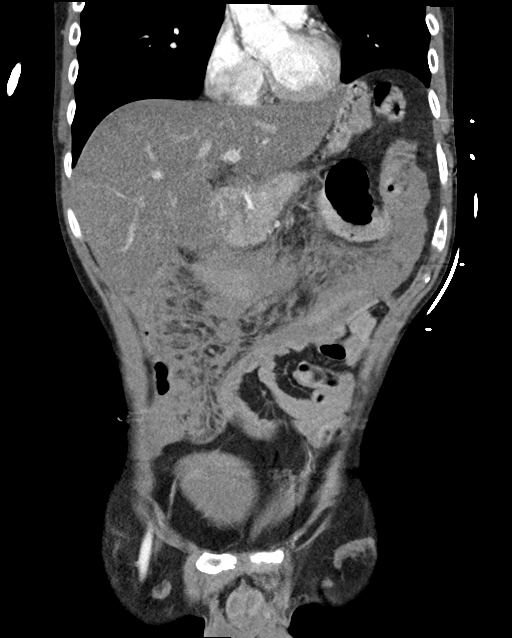
[im 44/98  soft-tissue]
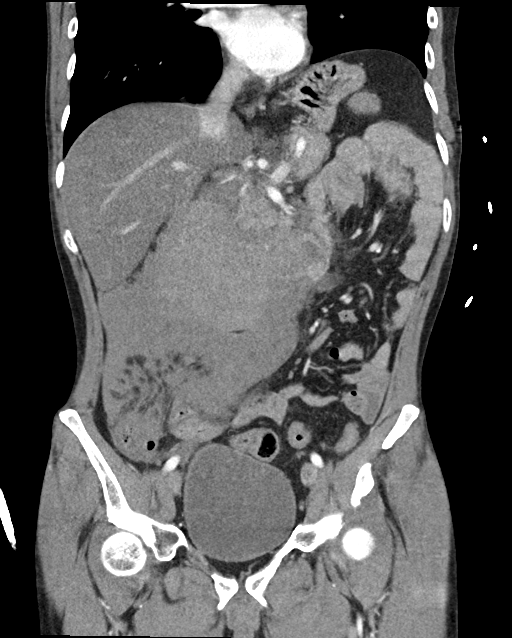
[im 54/98  soft-tissue]
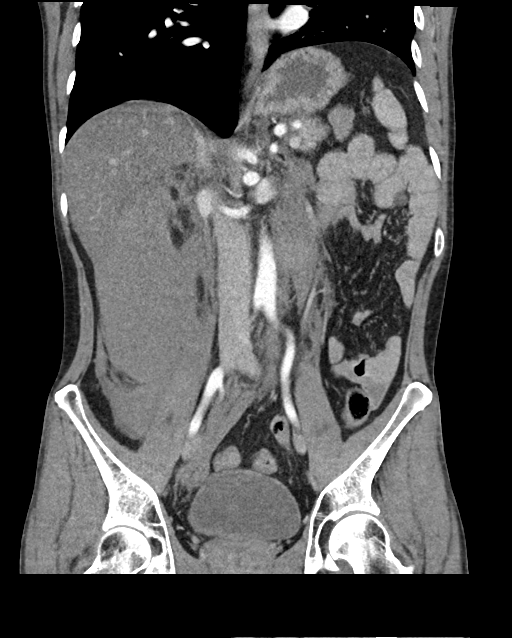

[Series 8: delay 5.0 br40 1 · axial · delayed · 0.76mm/px · z∈[-818,-408]mm · 10 of 100 slices shown, 12 images]
[im 9/100  soft-tissue]
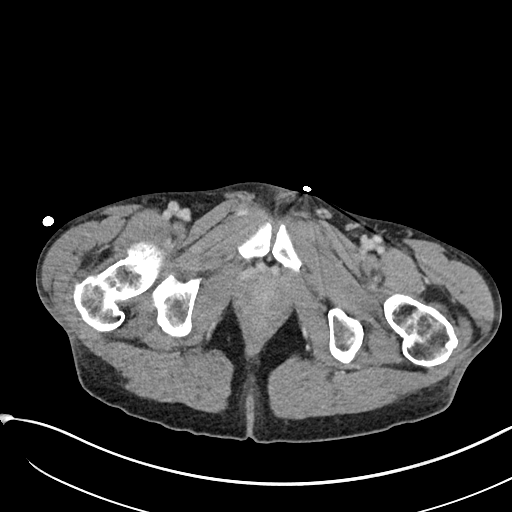
[im 9/100  bone]
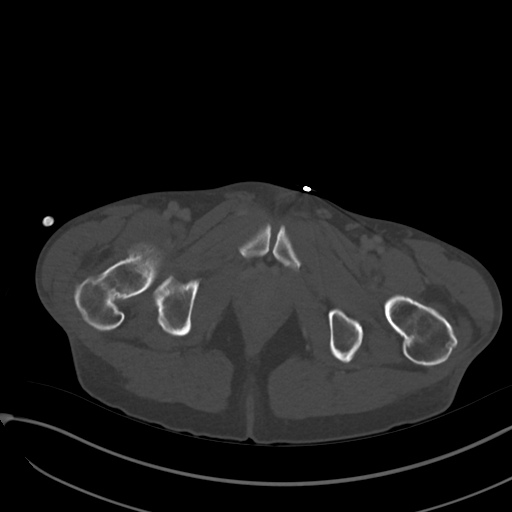
[im 17/100  soft-tissue]
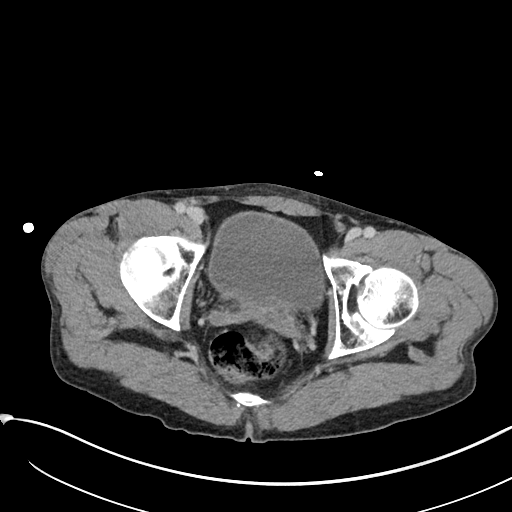
[im 25/100  soft-tissue]
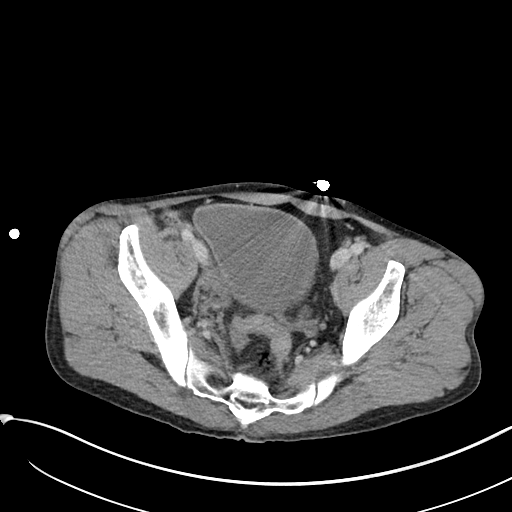
[im 34/100  soft-tissue]
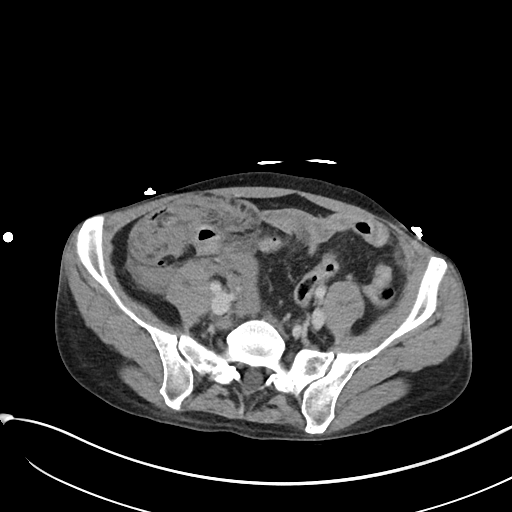
[im 42/100  soft-tissue]
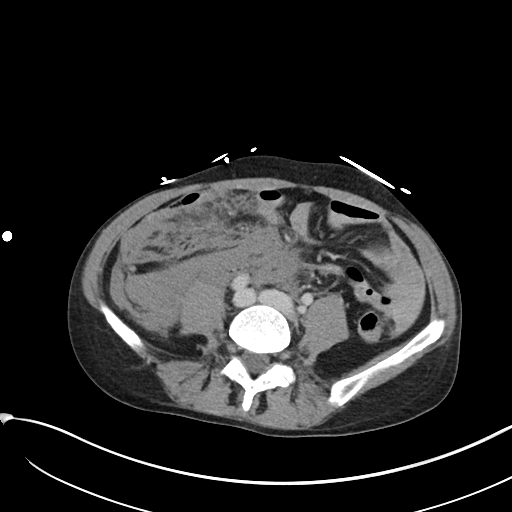
[im 58/100  soft-tissue]
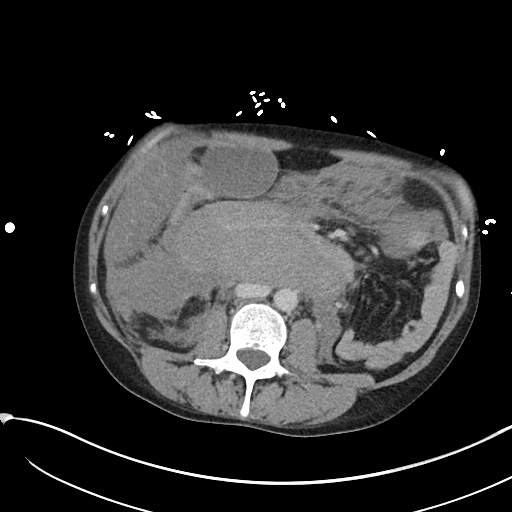
[im 67/100  soft-tissue]
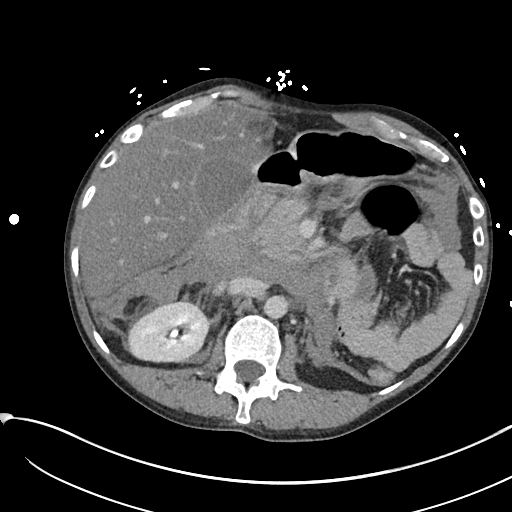
[im 75/100  soft-tissue]
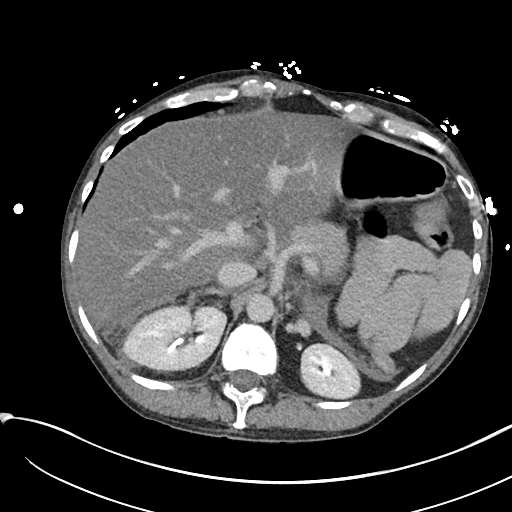
[im 83/100  soft-tissue]
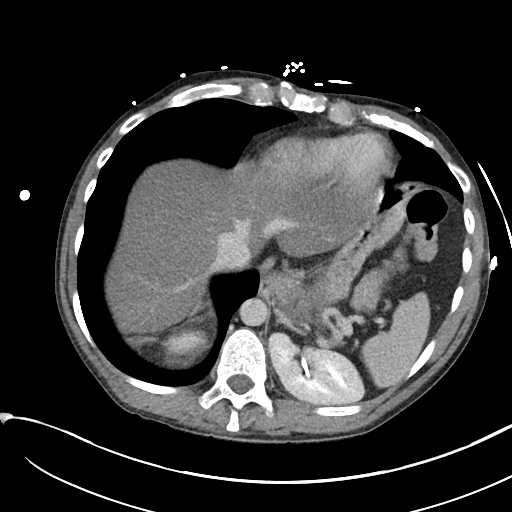
[im 83/100  bone]
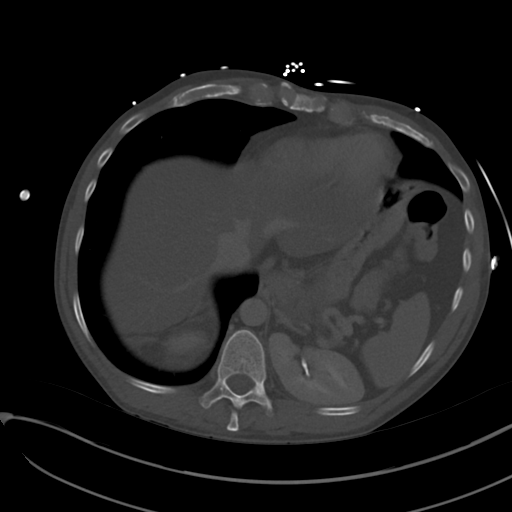
[im 91/100  soft-tissue]
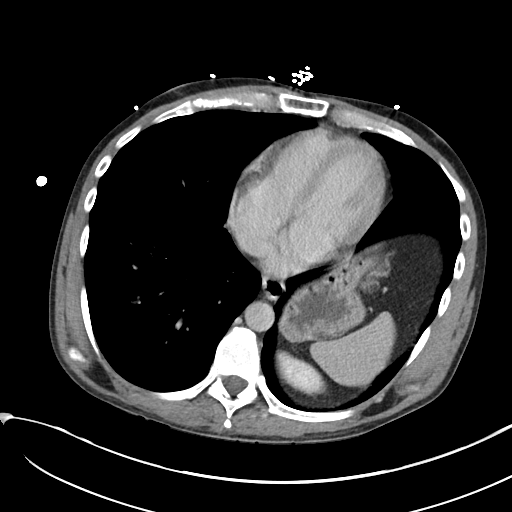

[13 of 46 positions shown; findings below may reference images not displayed]

RADIATION DOSE REDUCTION: This exam was performed according to the
departmental dose-optimization program which includes automated
exposure control, adjustment of the mA and/or kV according to
patient size and/or use of iterative reconstruction technique.

CONTRAST:  100mL OMNIPAQUE IOHEXOL 350 MG/ML SOLN
right upper quadrant abdominal
ultrasound.; CT abdomen and pelvis [DATE]; MRI abdomen
[DATE]
FINDINGS: Lower chest: There is chronic elevation of the left hemidiaphragm
with superior most aspect of the left hemidiaphragm/left upper
abdominal quadrant incompletely imaged. The lung bases are clear.

Hepatobiliary: There is diffuse moderate decreased density seen
throughout the liver suggesting fatty infiltration. Smooth liver
contours. There is a low-density 2.1 x 1.4 x 2.3 cm (transverse by
AP by craniocaudal) lesion within the anterior inferior aspect of
the medial segment of the left hepatic lobe (axial image 27 and
coronal image 11), not significantly changed in size from [DATE]
measured in a similar manner. This may represent an area of focal
fatty infiltration or a cyst. The gallbladder is moderately
distended but retains a normal thin wall without definite
inflammatory change.

Pancreas: There is mild fat stranding around and haziness within the
region of the pancreatic head likely secondary to the process in the
third portion of the duodenum.

Spleen: The superior aspect of the spleen is not imaged but the
spleen is otherwise unremarkable.

Adrenals/Urinary Tract: Normal adrenals. There is slightly decreased
enhancement of the right kidney with respect to the left kidney. No
hydronephrosis. No focal urinary bladder wall thickening.

Stomach/Bowel: Mild sigmoid and distal diffuse descending colon wall
thickening may be due to underdistention versus mild colitis. There
is high-grade decompression of the transverse colon which is
difficult to differentiate from adjacent ascites. Normal appendix
(axial series 3, images 51 through 62). There is abnormal wall
thickening of the first second third and fourth portions of the
duodenum with the highest grade thickening seen within the posterior
third portion of the duodenal wall measuring up to 4.3 cm in AP
dimension (axial series 2, image 35). This is highly concerning for
"pseudoaneurysmal dilatation of the small bowel" as can be seen with
lymphoma.

There is also apparent thickening of the ascending greater than
transverse colon wall and with surrounding mesenteric fluid and
inflammatory fat stranding in the ascending colon is not well
visualized, with suspected moderate wall thickening. Right upper
quadrant linear air (axial series 2, images 50-58) is favored to
represent air within narrowed distal ascending colon. No definite
pneumatosis. No definite abnormality is seen within the terminal
ileum.

Vascular/Lymphatic: No abdominal aortic aneurysm. The major
intra-abdominal aortic branch vessels are patent. There is
high-grade thickening of the wall of the third portion of the
duodenum. No separate enlarged individual mesenteric retroperitoneal
or pelvic lymph nodes are identified.

Reproductive: The prostate measures up to 5.0 cm in transverse
dimension, moderately distended. The seminal vesicles are grossly
unremarkable.

Other: No ventral abdominal wall hernia is seen. There is
mild-to-moderate free fluid within the right hemiabdomen mesentery
greatest in the region of the right lower quadrant posterior to the
ascending colon. No pneumoperitoneum is identified.

Musculoskeletal: There is transitional lumbosacral anatomy with 6
non-rib-bearing vertebral bodies, considered L1 through L6. Partial
sacralization of L6. Rudimentary L6-S1 disc. Moderate disc space
narrowing and anterior endplate osteophytes of the lower thoracic
spine.
IMPRESSION: 1. The third portion of the duodenum and ascending colon are
markedly abnormal. There appears to be high-grade mass-like wall
thickening of the posterior greater than anterior aspects of the
third portion of the duodenum. There is ascites greatest within the
right lower quadrant, surrounding the ascending colon which is
apparently decompressed with more mild to moderate thick wall. The
more distal colon is decompressed which may be the cause for
apparent wall thickening in these regions. No definite bowel
perforation is seen, although note is made that the ascending colon
is difficult to visualize, and on axial images 50 through 58 there
is air that is favored to be within narrow distal ascending colon
lumen rather than extraluminal air. In addition to small bowel
masses such as lymphoma, inflammatory bowel disease is considered
but felt less likely. Note is made that these regions of bowel were
normal in appearance on [DATE] prior CT.
2. Moderate fatty infiltration of the liver.

Critical Value/emergent results were called by telephone at the time
of interpretation on [DATE] at [DATE] to provider RTOYOTA
and RTOYOTA , who verbally acknowledged these
results.

## 2022-04-16 MED ORDER — MORPHINE SULFATE (PF) 2 MG/ML IV SOLN
2.0000 mg | INTRAVENOUS | Status: DC | PRN
  Administered 2022-04-16: 2 mg via INTRAVENOUS

## 2022-04-16 MED ORDER — FENTANYL CITRATE PF 50 MCG/ML IJ SOSY
100.0000 ug | PREFILLED_SYRINGE | Freq: Once | INTRAMUSCULAR | Status: AC
  Administered 2022-04-16: 100 ug via INTRAVENOUS

## 2022-04-16 MED ORDER — LORAZEPAM 2 MG/ML IJ SOLN
0.0000 mg | Freq: Two times a day (BID) | INTRAMUSCULAR | Status: DC
  Administered 2022-04-19: 2 mg via INTRAVENOUS

## 2022-04-16 MED ORDER — PANTOPRAZOLE SODIUM 40 MG IV SOLR
40.0000 mg | Freq: Two times a day (BID) | INTRAVENOUS | Status: DC
  Administered 2022-04-16 – 2022-04-23 (×14): 40 mg via INTRAVENOUS

## 2022-04-16 MED ORDER — ALBUTEROL SULFATE (2.5 MG/3ML) 0.083% IN NEBU: 2.5000 mg | INHALATION_SOLUTION | Freq: Four times a day (QID) | RESPIRATORY_TRACT | Status: DC | PRN

## 2022-04-16 MED ORDER — ACETAMINOPHEN 325 MG PO TABS: 650.0000 mg | ORAL_TABLET | Freq: Four times a day (QID) | ORAL | Status: DC | PRN

## 2022-04-16 MED ORDER — TRIMETHOBENZAMIDE HCL 100 MG/ML IM SOLN
200.0000 mg | Freq: Four times a day (QID) | INTRAMUSCULAR | Status: DC | PRN
  Administered 2022-04-21: 200 mg via INTRAMUSCULAR
  Filled 2022-04-16 (×3): qty 2

## 2022-04-16 MED ORDER — LORAZEPAM 1 MG PO TABS
1.0000 mg | ORAL_TABLET | ORAL | Status: AC | PRN
  Administered 2022-04-18 (×2): 2 mg via ORAL

## 2022-04-16 MED ORDER — LACTATED RINGERS IV BOLUS
1000.0000 mL | Freq: Once | INTRAVENOUS | Status: AC
  Administered 2022-04-16: 1000 mL via INTRAVENOUS

## 2022-04-16 MED ORDER — THIAMINE HCL 100 MG/ML IJ SOLN
100.0000 mg | Freq: Every day | INTRAMUSCULAR | Status: DC
  Administered 2022-04-16 – 2022-04-19 (×2): 100 mg via INTRAVENOUS

## 2022-04-16 MED ORDER — LACTATED RINGERS IV SOLN
INTRAVENOUS | Status: DC
Start: 2022-04-16 — End: 2022-04-23

## 2022-04-16 MED ORDER — ENOXAPARIN SODIUM 40 MG/0.4ML IJ SOSY
40.0000 mg | PREFILLED_SYRINGE | INTRAMUSCULAR | Status: DC
  Administered 2022-04-21 – 2022-04-22 (×2): 40 mg via SUBCUTANEOUS

## 2022-04-16 MED ORDER — ACETAMINOPHEN 650 MG RE SUPP: 650.0000 mg | Freq: Four times a day (QID) | RECTAL | Status: DC | PRN

## 2022-04-16 MED ORDER — LEVETIRACETAM IN NACL 500 MG/100ML IV SOLN
500.0000 mg | Freq: Two times a day (BID) | INTRAVENOUS | Status: DC
  Administered 2022-04-16 – 2022-04-23 (×14): 500 mg via INTRAVENOUS
  Filled 2022-04-16 (×2): qty 100

## 2022-04-16 MED ORDER — LORAZEPAM 2 MG/ML IJ SOLN
1.0000 mg | INTRAMUSCULAR | Status: AC | PRN
  Administered 2022-04-16 (×2): 2 mg via INTRAVENOUS
  Administered 2022-04-17: 3 mg via INTRAVENOUS
  Administered 2022-04-17 – 2022-04-18 (×6): 2 mg via INTRAVENOUS

## 2022-04-16 MED ORDER — FOLIC ACID 1 MG PO TABS
1.0000 mg | ORAL_TABLET | Freq: Every day | ORAL | Status: DC
  Administered 2022-04-17 – 2022-04-19 (×3): 1 mg via ORAL

## 2022-04-16 MED ORDER — SODIUM CHLORIDE 0.9 % IV BOLUS
1000.0000 mL | Freq: Once | INTRAVENOUS | Status: AC
Start: 2022-04-16 — End: 2022-04-16
  Administered 2022-04-16: 1000 mL via INTRAVENOUS

## 2022-04-16 MED ORDER — LORAZEPAM 1 MG PO TABS
0.0000 mg | ORAL_TABLET | Freq: Two times a day (BID) | ORAL | Status: DC
  Administered 2022-04-19: 2 mg via ORAL

## 2022-04-16 MED ORDER — LORAZEPAM 2 MG/ML IJ SOLN
0.0000 mg | Freq: Four times a day (QID) | INTRAMUSCULAR | Status: DC
  Administered 2022-04-16: 1 mg via INTRAVENOUS
  Administered 2022-04-16 – 2022-04-17 (×4): 2 mg via INTRAVENOUS
  Filled 2022-04-16: qty 1

## 2022-04-16 MED ORDER — SODIUM CHLORIDE 0.9% FLUSH
3.0000 mL | Freq: Two times a day (BID) | INTRAVENOUS | Status: DC
  Administered 2022-04-16 – 2022-04-23 (×13): 3 mL via INTRAVENOUS

## 2022-04-16 MED ORDER — PROCHLORPERAZINE EDISYLATE 10 MG/2ML IJ SOLN
10.0000 mg | Freq: Once | INTRAMUSCULAR | Status: AC
  Administered 2022-04-17: 10 mg via INTRAVENOUS

## 2022-04-16 MED ORDER — CALCIUM GLUCONATE-NACL 2-0.675 GM/100ML-% IV SOLN
2.0000 g | Freq: Once | INTRAVENOUS | Status: AC
  Administered 2022-04-16: 2000 mg via INTRAVENOUS
  Filled 2022-04-16: qty 100

## 2022-04-16 MED ORDER — HYDROMORPHONE HCL 1 MG/ML IJ SOLN
0.5000 mg | INTRAMUSCULAR | Status: DC | PRN
  Administered 2022-04-16 – 2022-04-22 (×30): 0.5 mg via INTRAVENOUS

## 2022-04-16 MED ORDER — SODIUM CHLORIDE 0.9 % IV BOLUS
1000.0000 mL | Freq: Once | INTRAVENOUS | Status: AC
  Administered 2022-04-16: 1000 mL via INTRAVENOUS

## 2022-04-16 MED ORDER — LORAZEPAM 1 MG PO TABS: 0.0000 mg | ORAL_TABLET | Freq: Four times a day (QID) | ORAL | Status: DC

## 2022-04-16 MED ORDER — IOHEXOL 350 MG/ML SOLN
100.0000 mL | Freq: Once | INTRAVENOUS | Status: AC | PRN
  Administered 2022-04-16: 100 mL via INTRAVENOUS

## 2022-04-16 MED ORDER — THIAMINE HCL 100 MG PO TABS
100.0000 mg | ORAL_TABLET | Freq: Every day | ORAL | Status: DC
  Administered 2022-04-17 – 2022-04-18 (×2): 100 mg via ORAL

## 2022-04-16 MED ORDER — ADULT MULTIVITAMIN W/MINERALS CH
1.0000 | ORAL_TABLET | Freq: Every day | ORAL | Status: DC
  Administered 2022-04-17 – 2022-04-19 (×3): 1 via ORAL

## 2022-04-16 NOTE — ED Notes (Signed)
Hospitalist at bedside 

## 2022-04-16 NOTE — ED Notes (Signed)
Pt given ice chips w/ GI MD's permission.
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
## 2022-04-16 NOTE — ED Provider Triage Note (Addendum)
Emergency Medicine Provider Triage Evaluation Note  Gary [name] , a 36 y.o. male  was evaluated in triage.  Pt complains of abdominal pain, nausea, vomiting, diarrhea, onset a few weeks ago. Reports regular, heavy alcohol use, has not had seizure meds in over 1 week. Brought in by EMS, called by bystander who found patient down, weak, hypotensive, given some IVF Non bloody emesis in bag Review of Systems  Positive: Abdominal pain, nausea, vomiting, diarrhea Negative: fever  Physical Exam  There were no vitals taken for this visit. Gen:   Awake, no distress   Resp:  Normal effort  MSK:   Moves extremities without difficulty  Other:  Tremulous   Medical Decision Making  Medically screening exam initiated at 10:24 AM.  Appropriate orders placed.  ARNOLD KESTER was informed that the remainder of the evaluation will be completed by another provider, this initial triage assessment does not replace that evaluation, and the importance of remaining in the ED until their evaluation is complete.     Jeannie Fend, PA-C 04/16/22 1032    Jeannie Fend, PA-C 04/16/22 1042

## 2022-04-16 NOTE — ED Triage Notes (Addendum)
Pt was seen by a bystander- pt barely responsive. EMS responded and pt BP while layin down was 80/62 HR 125. When pt stood with EMS, he told them he could barely see and almost passed out. Tremors increased. EMS reports pt told them that he only drinks beer and liquor. Pt tried drinking a beer this morning, but vomited with every sip. Pt complains of abd pain. Pt has been out of seizure medication for a week. Pt does not recall having a seizure since he has been out of his meds.

## 2022-04-16 NOTE — ED Notes (Signed)
Pt continues to ask for pain medication.  Pt informed he has Tylenol available and the PRN morphine is due at 1930.  Pt is specifically asking for Dilaudid.  Will make Hospitalist aware.

## 2022-04-16 NOTE — ED Provider Notes (Addendum)
[facility]   CSN: [id number] Arrival date & time: 04/16/22  1023     History Chief Complaint  Patient presents with   Abdominal Pain   Alcohol Intoxication    Drinks 15 beers and 1/5th of vodka a day    Gary [name] is a 36 y.o. male with h/o HIV, epilepsy, alcohol use disorder presents to the ED for evaluation of abdominal pain with nausea and vomiting since "daylight" today. The patient reports that he has had around 5 episodes of non bloody, non-bilious emesis. Reports diffuse abdominal pain.  Patient is a daily drinker around 15 beers and 1/5 of vodka a day.  He reports his last drink technically was at 0500 this morning with a beer, but he did vomit it.  Drink prior to that was around 2200 last night.  He denies any dysuria or hematuria.  He reports some dark stools and diarrhea.  Denies any fever.  Does endorse chest pain and shortness of breath since this morning as well.  Denies any cough or cold symptoms.  Denies any fever or chills.  He reports that he is an occasional tobacco user.  But denies any drug use of any kind including IV drug use. He reports a h/o epilepsy, but has not had his medications in weeks.    Abdominal Pain Associated symptoms: chest pain, shortness of breath and vomiting   Associated symptoms: no chills, no constipation, no diarrhea, no dysuria, no fever, no hematuria and no nausea   Alcohol Intoxication Associated symptoms include chest pain, abdominal pain and shortness of breath.       Home Medications Prior to Admission medications   Medication Sig Start Date End Date Taking? Authorizing Provider  bictegravir-emtricitabine-tenofovir AF (BIKTARVY) 50-200-25 MG TABS tablet Take 1 tablet by mouth daily. 01/27/22  Yes Danford, Earl Liteshristopher P, MD  chlorproMAZINE (THORAZINE) 10 MG tablet Take 1 tablet (10 mg total) by mouth 3 (three) times daily. 01/27/22  Yes Danford, Earl Liteshristopher P, MD  folic acid  (FOLVITE) 1 MG tablet Take 1 tablet (1 mg total) by mouth daily. 01/28/22  Yes Danford, Earl Liteshristopher P, MD  gabapentin (NEURONTIN) 300 MG capsule Take 1 capsule (300 mg total) by mouth 3 (three) times daily. 01/27/22  Yes Danford, Earl Liteshristopher P, MD  Lacosamide 100 MG TABS Take 1 tablet (100 mg total) by mouth 2 (two) times daily. 03/16/22  Yes Danford, Earl Liteshristopher P, MD  lamoTRIgine (LAMICTAL) 25 MG tablet Take 1 tablet (25mg ) by mouth daily and  increase dose by 1 tablet (25 mg) each week. Patient taking differently: Take by mouth See admin instructions. Take 1 tablet (25mg ) by mouth daily and  increase dose by 1 tablet (25 mg) each week. 01/27/22  Yes Danford, Earl Liteshristopher P, MD  chlordiazePOXIDE (LIBRIUM) 25 MG capsule 50mg  PO TID x 1D, then 25-50mg  PO BID X 1D, then 25-50mg  PO QD X 1D 02/04/22   Petrucelli, Samantha R, PA-C  famotidine (PEPCID) 20 MG tablet Take 1 tablet (20 mg total) by mouth 2 (two) times daily. Patient not taking: Reported on 06/01/2019 05/14/19 06/02/19  Benjiman CorePickering, Nathan, MD      Allergies    Tegretol [carbamazepine] and Caffeine    Review of Systems   Review of Systems  Constitutional:  Negative for chills and fever.  Respiratory:  Positive for shortness of breath.   Cardiovascular:  Positive for chest pain.  Gastrointestinal:  Positive for abdominal pain and vomiting. Negative for constipation, diarrhea and  nausea.  Genitourinary:  Negative for dysuria and hematuria.  Neurological:  Negative for seizures.    Physical Exam Updated Vital Signs BP 109/79   Pulse (!) 124   Temp 97.9 F (36.6 C) (Oral)   Resp (!) 26   SpO2 100%  Physical Exam Constitutional:      Appearance: Normal appearance.     Comments: Unkempt, uncomfortable, clutching abdomen  HENT:     Head: Normocephalic.     Comments: Bruising noted under the left eye     Mouth/Throat:     Mouth: Mucous membranes are dry.  Eyes:     General: No scleral icterus. Cardiovascular:     Rate and Rhythm:  Tachycardia present.  Pulmonary:     Effort: Pulmonary effort is normal. No respiratory distress.     Breath sounds: Normal breath sounds.     Comments: Mild tachypnea.  Patient still speaking in full sentences with ease.  Satting well on room air. Abdominal:     General: Abdomen is flat. Bowel sounds are normal.     Palpations: Abdomen is soft.     Tenderness: There is generalized abdominal tenderness. There is guarding. There is no rebound.  Musculoskeletal:        General: No deformity.     Cervical back: Normal range of motion.  Skin:    General: Skin is warm and dry.  Neurological:     General: No focal deficit present.     Mental Status: He is alert. Mental status is at baseline.     Motor: Tremor present.     ED Results / Procedures / Treatments   Labs (all labs ordered are listed, but only abnormal results are displayed) Labs Reviewed  CBC WITH DIFFERENTIAL/PLATELET - Abnormal; Notable for the following components:      Result Value   RBC 3.49 (*)    Hemoglobin 12.0 (*)    HCT 36.6 (*)    MCV 104.9 (*)    MCH 34.4 (*)    All other components within normal limits  COMPREHENSIVE METABOLIC PANEL - Abnormal; Notable for the following components:   Chloride 97 (*)    CO2 14 (*)    Glucose, Bld 152 (*)    Creatinine, Ser 1.28 (*)    Calcium 8.4 (*)    AST 50 (*)    Anion gap 29 (*)    All other components within normal limits  LIPASE, BLOOD - Abnormal; Notable for the following components:   Lipase 628 (*)    All other components within normal limits  ETHANOL - Abnormal; Notable for the following components:   Alcohol, Ethyl (B) 203 (*)    All other components within normal limits  I-STAT CHEM 8, ED - Abnormal; Notable for the following components:   Creatinine, Ser 1.40 (*)    Glucose, Bld 148 (*)    Calcium, Ion 0.98 (*)    TCO2 16 (*)    Hemoglobin 12.9 (*)    HCT 38.0 (*)    All other components within normal limits  MAGNESIUM  URINALYSIS, ROUTINE W  REFLEX MICROSCOPIC  RAPID URINE DRUG SCREEN, HOSP PERFORMED  LACTIC ACID, PLASMA  LACTIC ACID, PLASMA  TROPONIN I (HIGH SENSITIVITY)    EKG EKG Interpretation  Date/Time:  Thursday April 16 2022 11:01:22 EDT Ventricular Rate:  108 PR Interval:  126 QRS Duration: 87 QT Interval:  367 QTC Calculation: 492 R Axis:   70 Text Interpretation: Sinus tachycardia Prolonged QT interval when compared  to prior, sharper t waves but overall similar appearance. No STEMI Confirmed by Theda Belfast ([id number]) on 04/16/2022 11:06:18 AM  Radiology CT ABDOMEN PELVIS W CONTRAST  Result Date: 04/16/2022 CLINICAL DATA:  Acute nonlocalized abdominal pain. Nausea, vomiting, and diarrhea onset a few weeks ago. Regular heavy alcohol use. Found down coronal week and hypotensive. EXAM: CT ABDOMEN AND PELVIS WITH CONTRAST TECHNIQUE: Multidetector CT imaging of the abdomen and pelvis was performed using the standard protocol following bolus administration of intravenous contrast. RADIATION DOSE REDUCTION: This exam was performed according to the departmental dose-optimization program which includes automated exposure control, adjustment of the mA and/or kV according to patient size and/or use of iterative reconstruction technique. CONTRAST:  OMNIPAQUE IOHEXOL 350 MG/ML SOLN COMPARISON:  AP chest 01/20/2022; right upper quadrant abdominal ultrasound.; CT abdomen and pelvis 07/11/2021; MRI abdomen 03/17/2021 FINDINGS: Lower chest: There is chronic elevation of the left hemidiaphragm with superior most aspect of the left hemidiaphragm/left upper abdominal quadrant incompletely imaged. The lung bases are clear. Hepatobiliary: There is diffuse moderate decreased density seen throughout the liver suggesting fatty infiltration. Smooth liver contours. There is a low-density 2.1 x 1.4 x 2.3 cm (transverse by AP by craniocaudal) lesion within the anterior inferior aspect of the medial segment of the left hepatic lobe (axial image 27  and coronal image 11), not significantly changed in size from 07/06/2020 measured in a similar manner. This may represent an area of focal fatty infiltration or a cyst. The gallbladder is moderately distended but retains a normal thin wall without definite inflammatory change. Pancreas: There is mild fat stranding around and haziness within the region of the pancreatic head likely secondary to the process in the third portion of the duodenum. Spleen: The superior aspect of the spleen is not imaged but the spleen is otherwise unremarkable. Adrenals/Urinary Tract: Normal adrenals. There is slightly decreased enhancement of the right kidney with respect to the left kidney. No hydronephrosis. No focal urinary bladder wall thickening. Stomach/Bowel: Mild sigmoid and distal diffuse descending colon wall thickening may be due to underdistention versus mild colitis. There is high-grade decompression of the transverse colon which is difficult to differentiate from adjacent ascites. Normal appendix (axial series 3, images 51 through 62). There is abnormal wall thickening of the first second third and fourth portions of the duodenum with the highest grade thickening seen within the posterior third portion of the duodenal wall measuring up to 4.3 cm in AP dimension (axial series 2, image 35). This is highly concerning for "pseudoaneurysmal dilatation of the small bowel" as can be seen with lymphoma. There is also apparent thickening of the ascending greater than transverse colon wall and with surrounding mesenteric fluid and inflammatory fat stranding in the ascending colon is not well visualized, with suspected moderate wall thickening. Right upper quadrant linear air (axial series 2, images 50-58) is favored to represent air within narrowed distal ascending colon. No definite pneumatosis. No definite abnormality is seen within the terminal ileum. Vascular/Lymphatic: No abdominal aortic aneurysm. The major intra-abdominal  aortic branch vessels are patent. There is high-grade thickening of the wall of the third portion of the duodenum. No separate enlarged individual mesenteric retroperitoneal or pelvic lymph nodes are identified. Reproductive: The prostate measures up to 5.0 cm in transverse dimension, moderately distended. The seminal vesicles are grossly unremarkable. Other: No ventral abdominal wall hernia is seen. There is mild-to-moderate free fluid within the right hemiabdomen mesentery greatest in the region of the right lower quadrant posterior to the ascending  colon. No pneumoperitoneum is identified. Musculoskeletal: There is transitional lumbosacral anatomy with 6 non-rib-bearing vertebral bodies, considered L1 through L6. Partial sacralization of L6. Rudimentary L6-S1 disc. Moderate disc space narrowing and anterior endplate osteophytes of the lower thoracic spine. IMPRESSION: 1. The third portion of the duodenum and ascending colon are markedly abnormal. There appears to be high-grade mass-like wall thickening of the posterior greater than anterior aspects of the third portion of the duodenum. There is ascites greatest within the right lower quadrant, surrounding the ascending colon which is apparently decompressed with more mild to moderate thick wall. The more distal colon is decompressed which may be the cause for apparent wall thickening in these regions. No definite bowel perforation is seen, although note is made that the ascending colon is difficult to visualize, and on axial images 50 through 58 there is air that is favored to be within narrow distal ascending colon lumen rather than extraluminal air. In addition to small bowel masses such as lymphoma, inflammatory bowel disease is considered but felt less likely. Note is made that these regions of bowel were normal in appearance on 07/11/2021 prior CT. 2. Moderate fatty infiltration of the liver. Critical Value/emergent results were called by telephone at the  time of interpretation on 04/16/2022 at 12:53 pm to provider Arshia Spellman and Lynden Oxford, MD , who verbally acknowledged these results. Electronically Signed   By: Neita Garnet M.D.   On: 04/16/2022 12:56   CT Head Wo Contrast  Result Date: 04/16/2022 CLINICAL DATA:  Found down EXAM: CT HEAD WITHOUT CONTRAST CT CERVICAL SPINE WITHOUT CONTRAST TECHNIQUE: Multidetector CT imaging of the head and cervical spine was performed following the standard protocol without intravenous contrast. Multiplanar CT image reconstructions of the cervical spine were also generated. RADIATION DOSE REDUCTION: This exam was performed according to the departmental dose-optimization program which includes automated exposure control, adjustment of the mA and/or kV according to patient size and/or use of iterative reconstruction technique. COMPARISON:  03/12/2022 CT head 09/22/2021 CT cervical spine FINDINGS: CT HEAD FINDINGS Brain: No evidence of acute infarction, hemorrhage, cerebral edema, mass, mass effect, or midline shift. No hydrocephalus or extra-axial fluid collection. Vascular: No hyperdense vessel. Skull: Normal. Negative for fracture or focal lesion. Sinuses/Orbits: No acute finding. Other: The mastoid air cells are well aerated. CT CERVICAL SPINE FINDINGS Alignment: Trace retrolisthesis of C3 on C4, C4 on C5, and C6 on C7, which appear unchanged compared to 09/22/2021. Normal facet alignment. Skull base and vertebrae: No acute fracture or suspicious osseous lesion. Soft tissues and spinal canal: No prevertebral fluid or swelling. No visible canal hematoma. Disc levels: Mild degenerative changes without high-grade spinal canal stenosis or neural foraminal narrowing. Upper chest: No focal pulmonary opacity or pleural effusion. Other: None. IMPRESSION: 1.  No acute intracranial process. 2.  No acute fracture or traumatic listhesis in the cervical spine. Electronically Signed   By: Wiliam Ke M.D.   On: 04/16/2022 12:22    CT Cervical Spine Wo Contrast  Result Date: 04/16/2022 CLINICAL DATA:  Found down EXAM: CT HEAD WITHOUT CONTRAST CT CERVICAL SPINE WITHOUT CONTRAST TECHNIQUE: Multidetector CT imaging of the head and cervical spine was performed following the standard protocol without intravenous contrast. Multiplanar CT image reconstructions of the cervical spine were also generated. RADIATION DOSE REDUCTION: This exam was performed according to the departmental dose-optimization program which includes automated exposure control, adjustment of the mA and/or kV according to patient size and/or use of iterative reconstruction technique. COMPARISON:  03/12/2022 CT  head 09/22/2021 CT cervical spine FINDINGS: CT HEAD FINDINGS Brain: No evidence of acute infarction, hemorrhage, cerebral edema, mass, mass effect, or midline shift. No hydrocephalus or extra-axial fluid collection. Vascular: No hyperdense vessel. Skull: Normal. Negative for fracture or focal lesion. Sinuses/Orbits: No acute finding. Other: The mastoid air cells are well aerated. CT CERVICAL SPINE FINDINGS Alignment: Trace retrolisthesis of C3 on C4, C4 on C5, and C6 on C7, which appear unchanged compared to 09/22/2021. Normal facet alignment. Skull base and vertebrae: No acute fracture or suspicious osseous lesion. Soft tissues and spinal canal: No prevertebral fluid or swelling. No visible canal hematoma. Disc levels: Mild degenerative changes without high-grade spinal canal stenosis or neural foraminal narrowing. Upper chest: No focal pulmonary opacity or pleural effusion. Other: None. IMPRESSION: 1.  No acute intracranial process. 2.  No acute fracture or traumatic listhesis in the cervical spine. Electronically Signed   By: Wiliam Ke M.D.   On: 04/16/2022 12:22    Procedures .Critical Care  Performed by: Achille Rich, PA-C Authorized by: Achille Rich, PA-C   Critical care provider statement:    Critical care time (minutes):  75   Critical care was  necessary to treat or prevent imminent or life-threatening deterioration of the following conditions:  Dehydration, metabolic crisis and circulatory failure   Critical care was time spent personally by me on the following activities:  Development of treatment plan with patient or surrogate, discussions with consultants, evaluation of patient's response to treatment, examination of patient, ordering and review of laboratory studies, ordering and review of radiographic studies, ordering and performing treatments and interventions, pulse oximetry, re-evaluation of patient's condition, review of old charts and obtaining history from patient or surrogate   Care discussed with: admitting provider      Medications Ordered in ED Medications  LORazepam (ATIVAN) injection 0-4 mg (2 mg Intravenous Given 04/16/22 1123)    Or  LORazepam (ATIVAN) tablet 0-4 mg ( Oral See Alternative 04/16/22 1123)  LORazepam (ATIVAN) injection 0-4 mg (has no administration in time range)    Or  LORazepam (ATIVAN) tablet 0-4 mg (has no administration in time range)  thiamine tablet 100 mg ( Oral See Alternative 04/16/22 1106)    Or  thiamine (B-1) injection 100 mg (100 mg Intravenous Given 04/16/22 1106)  lactated ringers bolus 1,000 mL (has no administration in time range)  sodium chloride 0.9 % bolus 1,000 mL (0 mLs Intravenous Stopped 04/16/22 1214)  sodium chloride 0.9 % bolus 1,000 mL (0 mLs Intravenous Stopped 04/16/22 1214)  iohexol (OMNIPAQUE) 350 MG/ML injection 100 mL (100 mLs Intravenous Contrast Given 04/16/22 1211)  fentaNYL (SUBLIMAZE) injection 100 mcg (100 mcg Intravenous Given 04/16/22 1330)    ED Course/ Medical Decision Making/ A&P                           Medical Decision Making Amount and/or Complexity of Data Reviewed Labs: ordered. Radiology: ordered.  Risk Prescription drug management. Decision regarding hospitalization.    36 year old male presents to the Emergency Department for evaluation of  abdominal pain and nausea and vomiting.  Differential diagnosis includes but limited to pancreatitis, small bowel obstruction, alcohol withdrawal, viral GI, alcohol intoxication, diverticulitis, colitis, PUD, gastritis, electrolyte abnormality.  On presentation, patient was initially hypotensive into the 50s systolically when he was placed in Trendelenburg he went up to the upper 70s to 80s systolically.  Fluids were started immediately and he is now in the 100s to 110 systolically.  He still remains tachycardic in the 120s.  Mild tachypnea although he is satting well on room air and he is speaking in full sentences.  Afebrile.  Physical exam is pertinent for a an unkempt and uncomfortable patient clutching his stomach and pain.  He has diffuse abdominal tenderness with guarding without any rebound.  Abdomen is soft and not rigid.  Flat no distention.  No wheezing heard.  No focal deficit.  Patient does have mild tremors noted in hands.  Given the bruising noted underneath the left eye, can assume the patient's had a fall.  Will obtain CT head and CT neck as the patient has had a previous brain bleed prior.  We will also obtain CT abdomen pelvis with contrast.  Chem-8 i-STAT ordered for quick imaging.  I independently reviewed and interpreted the patient's labs.  CMP shows significant decrease in chloride at 97.  Bicarb slightly decreased at 14.  Glucose elevated at 152.  Creatinine elevated at 1.28 from previous at 0.52 on the go.  Consistent with AKI.  CBC shows anemia with a hemoglobin of 12.  2.3 g drop from patient's 14.3 month.  Alcohol elevated at 203.  Lipase significantly elevated at 628.  Magnesium 2.1.  Troponin at 5.  I-STAT ordered which is a creatinine 1.4.  Patient cleared for CT scan.  Lactic acid is still pending.  Urinalysis and UDS are still pending as well.  He was given 2 L of normal saline and 1 L of LR.  He was given 100 mcg of fentanyl, 100 mg of thiamine, and 2 mg IV of Ativan.  My  attending attached to this chart and I had a long conversation with the radiologist who read his CT abdomen. The impression reads about the third portion of the duodenum and ascending colon are markedly abnormal.  There appears to be a high-grade masslike wall thickening of the posterior greater than anterior aspect of the third portion of the duodenum.  There is ascites greatest within the right lower quadrant, surrounding the ascending colon which is apparently decompressed with more mild to moderate thick wall.  The more distal colon is decompressed which may be the cause for apparent wall thickening in this regions.  No definite bowel perforation is seen, although note is made the ascending colon is difficult to visualize on axial images 50 through 58 there is air that is favored to be within the narrowed distal ascending colon lumen rather than extraluminal air.  In addition to a small bowel masses such as lymphoma, inflammatory bowel disease is considered but felt less likely.  Note is made that these regions of bowel are normal in appearance on 07-11-2021 in the prior CT scan.  There is moderate fatty infiltration of the liver.  CT of the head and neck unremarkable.  On re-evaluation, the patient reports that he is feeling better than initially, but still has some pain. Still slightly tremulous and tachycardic.   Given these findings, I did place out pages for her GI and for general surgery.  For general surgery, I spoke with Barnetta Chapel, PA with [facility].  I have asked if she would come evaluate the patient and review the scan.  She will come assess the patient at bedside when the patient is admitted as well.  Surgery recommended possible GI involvement for biopsy versus surgery.  I spoke with Shanda Bumps with [facility].  She question possible NG use for decompressing, however the patient is no longer retching after the  Ativan and appears more comfortable.  She will assess the patient to  determine if NG tube is needed while the patient is admitted.  I think this is reasonable.  On re-evaluation, I discussed admission with the patient who is amenable. He reports he would like to stop drinking alcohol. Still tachycardic, however nursing charted CIWA of 5.  Spoke with Dr. Madelyn Flavors about the patient being admitted.  He would like his CIWA score redone.  Will admit.  I discussed this case with my attending physician who cosigned this note including patient's presenting symptoms, physical exam, and planned diagnostics and interventions. Attending physician stated agreement with plan or made changes to plan which were implemented.   Attending physician assessed patient at bedside.  Final diagnoses:  Alcoholic ketoacidosis  Alcohol use disorder  AKI (acute kidney injury) (HCC)  Alcohol-induced acute pancreatitis without infection or necrosis  Anemia, unspecified type    Rx / DC Orders ED Discharge Orders     None         Achille Rich, PA-C 04/16/22 1425    Achille Rich, [place] 04/16/22 1430    Tegeler, Canary Brim, MD 04/17/22 [phone]

## 2022-04-16 NOTE — ED Notes (Signed)
GI MD at bedside
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
## 2022-04-16 NOTE — ED Notes (Signed)
Called lab to have troponin added to previous blood collection. Per lab, they will add the troponin onto previous sample.
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
## 2022-04-16 NOTE — ED Notes (Signed)
Pt refused Lovenox and stated he "does need it."  Pt sts he needs "5 days in the hospital to detox."  Pt educated he is not being admitted for detox, but we is being admitted for medical issues and we don't know how long he will be staying.

## 2022-04-17 DIAGNOSIS — R935 Abnormal findings on diagnostic imaging of other abdominal regions, including retroperitoneum: Secondary | ICD-10-CM

## 2022-04-17 DIAGNOSIS — D649 Anemia, unspecified: Secondary | ICD-10-CM

## 2022-04-17 DIAGNOSIS — R1114 Bilious vomiting: Secondary | ICD-10-CM

## 2022-04-17 DIAGNOSIS — N179 Acute kidney failure, unspecified: Secondary | ICD-10-CM

## 2022-04-17 DIAGNOSIS — K311 Adult hypertrophic pyloric stenosis: Secondary | ICD-10-CM

## 2022-04-17 LAB — COMPREHENSIVE METABOLIC PANEL
ALT: 25 U/L (ref 0–44)
AST: 29 U/L (ref 15–41)
Albumin: 3.5 g/dL (ref 3.5–5.0)
Alkaline Phosphatase: 34 U/L — ABNORMAL LOW (ref 38–126)
Anion gap: 10 (ref 5–15)
BUN: 8 mg/dL (ref 6–20)
CO2: 23 mmol/L (ref 22–32)
Calcium: 8.4 mg/dL — ABNORMAL LOW (ref 8.9–10.3)
Chloride: 104 mmol/L (ref 98–111)
Creatinine, Ser: 0.87 mg/dL (ref 0.61–1.24)
GFR, Estimated: >60 mL/min (ref >60)
Glucose, Bld: 121 mg/dL — ABNORMAL HIGH (ref 70–99)
Potassium: 4.2 mmol/L (ref 3.5–5.1)
Sodium: 137 mmol/L (ref 135–145)
Total Bilirubin: 1.4 mg/dL — ABNORMAL HIGH (ref 0.3–1.2)
Total Protein: 5.8 g/dL — ABNORMAL LOW (ref 6.5–8.1)

## 2022-04-17 LAB — CBC
HCT: 26.1 % — ABNORMAL LOW (ref 39.0–52.0)
Hemoglobin: 9.1 g/dL — ABNORMAL LOW (ref 13.0–17.0)
MCH: 34.9 pg — ABNORMAL HIGH (ref 26.0–34.0)
MCHC: 34.9 g/dL (ref 30.0–36.0)
MCV: 100 fL (ref 80.0–100.0)
Platelets: 113 10*3/uL — ABNORMAL LOW (ref 150–400)
RBC: 2.61 MIL/uL — ABNORMAL LOW (ref 4.22–5.81)
RDW: 14.3 % (ref 11.5–15.5)
WBC: 4.2 10*3/uL (ref 4.0–10.5)
nRBC: 0 % (ref 0.0–0.2)

## 2022-04-17 LAB — MAGNESIUM: Magnesium: 1.8 mg/dL (ref 1.7–2.4)

## 2022-04-17 LAB — T-HELPER CELLS (CD4) COUNT (NOT AT ARMC)
CD4 % Helper T Cell: 40 % (ref 33–65)
CD4 T Cell Abs: 244 /uL — ABNORMAL LOW (ref 400–1790)

## 2022-04-17 LAB — LIPASE, BLOOD: Lipase: 719 U/L — ABNORMAL HIGH (ref 11–51)

## 2022-04-17 LAB — OCCULT BLOOD, POC DEVICE: Fecal Occult Bld: POSITIVE — AB

## 2022-04-17 LAB — PHOSPHORUS: Phosphorus: 3.7 mg/dL (ref 2.5–4.6)

## 2022-04-17 MED ORDER — MAGNESIUM SULFATE 2 GM/50ML IV SOLN
2.0000 g | Freq: Once | INTRAVENOUS | Status: AC
  Administered 2022-04-17: 2 g via INTRAVENOUS
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
## 2022-04-17 NOTE — TOC Progression Note (Signed)
Transition of Care [facility]) - Progression Note    Patient Details  Name: Gary [name] MRN: [id number] Date of Birth: 1986/06/21  Transition of Care [facility]) CM/SW [place], [place] Work Phone Number: 04/17/2022, 2:06 PM  Clinical Narrative:    MSW intern received consult regarding substance use and homelessness. MSW intern met with patient who accepted resources and asked for help coordinating follow up PCP appointment. Patient could not remember PCP name, except for Dr. Quillian Quince.    Expected Discharge Plan: Homeless Shelter Barriers to Discharge: Continued Medical Work up, Inadequate or no insurance  Expected Discharge Plan and Services Expected Discharge Plan: Homeless Shelter   Discharge Planning Services: CM Consult   Living arrangements for the past 2 months: Homeless                                       Social Determinants of Health (SDOH) Interventions    Readmission Risk Interventions     No data to display

## 2022-04-17 NOTE — TOC Initial Note (Signed)
Transition of Care [facility]) - Initial/Assessment Note    Patient Details  Name: Gary [name] MRN: [id number] Date of Birth: May 29, 1986  [facility]) CM/SW Contact:    Pollie Friar, RN Phone Number: 04/17/2022, 1:51 PM  Clinical Narrative:                 CM met with the patient and attempted to discuss his situation. He was not able to stay awake to participate in the conversation. He did get out that he lives from place to place and wants to get back in with the ID clinic for his HIV meds.  CM attempted to talk about his substance abuse and he fell back asleep. Will need to re-attempt at a later time.  TOC will follow.  Expected Discharge Plan: Homeless Shelter Barriers to Discharge: Continued Medical Work up, Inadequate or no insurance   Patient Goals and CMS Choice        Expected Discharge Plan and Services Expected Discharge Plan: Homeless Shelter   Discharge Planning Services: CM Consult   Living arrangements for the past 2 months: Homeless                                      Prior Living Arrangements/Services Living arrangements for the past 2 months: Homeless Lives with:: Self Patient language and need for interpreter reviewed:: Yes              Criminal Activity/Legal Involvement Pertinent to Current Situation/Hospitalization: No - Comment as needed  Activities of Daily Living Home Assistive Devices/Equipment: None ADL Screening (condition at time of admission) Patient's cognitive ability adequate to safely complete daily activities?: Yes Is the patient deaf or have difficulty hearing?: No Does the patient have difficulty seeing, even when wearing glasses/contacts?: No Does the patient have difficulty concentrating, remembering, or making decisions?: No Patient able to express need for assistance with ADLs?: Yes Does the patient have difficulty dressing or bathing?: No Independently performs ADLs?: No Communication:  Independent Dressing (OT): Independent Grooming: Independent Feeding: Independent Bathing: Needs assistance Is this a change from baseline?: Change from baseline, expected to last <3 days Toileting: Needs assistance Is this a change from baseline?: Change from baseline, expected to last <3 days In/Out Bed: Needs assistance Is this a change from baseline?: Change from baseline, expected to last <3 days Walks in Home: Independent Does the patient have difficulty walking or climbing stairs?: Yes Weakness of Legs: Both Weakness of Arms/Hands: Both  Permission Sought/Granted                  Emotional Assessment Appearance:: Appears older than stated age     Orientation: : Oriented to Self, Oriented to Place   Psych Involvement: No (comment)  Admission diagnosis:  Alcoholic ketoacidosis [U88.91] Alcohol-induced pancreatitis [K85.20] AKI (acute kidney injury) ([place]) [N17.9] Alcohol use disorder [F10.90] Alcohol-induced acute pancreatitis without infection or necrosis [K85.20] Anemia, unspecified type [D64.9] Patient Active Problem List   Diagnosis Date Noted   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   Duodenal mass 69/45/0388   Metabolic acidosis with increased anion gap and accumulation of organic acids 04/16/2022   History of seizures 04/16/2022   Prolonged QT interval 04/16/2022   Tobacco abuse 04/16/2022   Hypokalemia 01/22/2022   Chest pain 01/20/2022   Involuntary commitment secondary to suicidal ideation 01/20/2022   Alcohol withdrawal hallucinosis (HCC) alcohol use disorder, severe, dependence alcohol-induced mood  disorder 01/01/2022   Macrocytosis 12/31/2021   Withdrawal symptoms, alcohol ([place]) 11/06/2021   Constipation 07/30/2021   Alcohol-induced insomnia (HCC)    MDD (major depressive disorder), recurrent episode, severe ([place]) 07/26/2021   Neutropenia ([place]) 07/26/2021   Alcohol withdrawal seizure without complication ([place])    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   Elevated troponin 10/19/2020   Alcohol abuse with withdrawal ([place]) 10/19/2020   Alcohol abuse    Suicidal ideation 06/14/2020   Pancreatitis 06/13/2020   Substance induced mood disorder ([place]) 06/08/2020   Malnutrition of moderate degree 05/07/2020   Gastrointestinal hemorrhage    Alcoholic hepatitis without ascites    Melena 53/39/1792   Alcoholic ketoacidosis 17/83/7542   Thrombocytopenia ([place]) 07/25/2019   Transaminitis 07/25/2019   Traumatic subdural hematoma ([place]) 07/02/2019   Alcohol intoxication with delirium ([place]) 07/02/2019   Hypocalcemia 07/02/2019   Alcohol-induced mood disorder ([place]) 05/13/2019   Alcohol use disorder, severe, dependence ([place]) 04/16/2019   Major depressive disorder, 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 [place] [address] Phone: [phone] Fax: [phone]     Social Determinants of Health (SDOH) Interventions    Readmission Risk Interventions     No data to display

## 2022-04-17 NOTE — Progress Notes (Addendum)
[facility] Progress Note  CC:  Abdominal pain, possible duodenal mass  Subjective: He is somnolent, briefly arousable.  He denies having any abdominal pain but stated he hurts all over.  No nausea or vomiting.  He reported passing 2 bowel movements today, however, his nursing staff cannot confirm this.  He received Dilaudid at 6:30 and 10 AM and Lorazepam at 10 AM per CIWA protocol.  Objective:  Vital signs in last 24 hours: Temp:  [98.1 F (36.7 C)-98.9 F (37.2 C)] 98.1 F (36.7 C) (06/09 0500) Pulse Rate:  [109-134] 121 (06/09 0500) Resp:  [14-26] 16 (06/09 0200) BP: (101-145)/(71-105) 137/86 (06/09 0500) SpO2:  [95 %-100 %] 95 % (06/09 0500) Weight:  [70.5 kg] 70.5 kg (06/08 2237)   General: Thin ill-appearing somnolent 36 year old male in no immediate distress. Heart: Regular rate and rhythm, no murmurs Pulm: Breath sounds clear throughout. Abdomen: Soft, nondistended.  Nontender.  No rebound or guarding.  Hypoactive bowel sounds to all 4 quadrants. Extremities:  Without edema. Neurologic: Awakens to name.  Answers questions appropriately with brief responses.  All extremities. Mildly tremulous. Psych:  Alert and cooperative. Normal mood and affect.  Intake/Output from previous day: No intake/output data recorded. Intake/Output this shift: No intake/output data recorded.  Lab Results: Recent Labs    04/16/22 1037 04/16/22 1108 04/17/22 0430  WBC 6.5  --  4.2  HGB 12.0* 12.9* 9.1*  HCT 36.6* 38.0* 26.1*  PLT 170  --  113*   BMET Recent Labs    04/16/22 1037 04/16/22 1108 04/16/22 1730 04/17/22 0430  NA 140 137 140 137  K 4.0 4.0 4.6 4.2  CL 97* 99 104 104  CO2 14*  --  16* 23  GLUCOSE 152* 148* 102* 121*  BUN 12 13 9 8   CREATININE 1.28* 1.40* 0.94 0.87  CALCIUM 8.4*  --  8.0* 8.4*   LFT Recent Labs    04/17/22 0430  PROT 5.8*  ALBUMIN 3.5  AST 29  ALT 25  ALKPHOS 34*  BILITOT 1.4*   PT/INR No results for input(s):  "LABPROT", "INR" in the last 72 hours. Hepatitis Panel No results for input(s): "HEPBSAG", "HCVAB", "HEPAIGM", "HEPBIGM" in the last 72 hours.  DG Chest Port 1 View  Result Date: 04/16/2022 CLINICAL DATA:  Alcohol withdrawal.  Epilepsy. EXAM: PORTABLE CHEST 1 VIEW COMPARISON:  Chest radiograph 01/20/2022 FINDINGS: Chronic elevation of the left hemidiaphragm. Otherwise, the lungs are clear. Heart size is within normal limits and stable. Trachea is midline. Negative for a pneumothorax. No acute bone abnormality. IMPRESSION: No active disease. Electronically Signed   By: Markus Daft M.D.   On: 04/16/2022 13:29   CT ABDOMEN PELVIS W CONTRAST  Result Date: 04/16/2022 CLINICAL DATA:  Acute nonlocalized abdominal pain. Nausea, vomiting, and diarrhea onset a few weeks ago. Regular heavy alcohol use. Found down coronal week and hypotensive. EXAM: CT ABDOMEN AND PELVIS WITH CONTRAST TECHNIQUE: Multidetector CT imaging of the abdomen and pelvis was performed using the standard protocol following bolus administration of intravenous contrast. RADIATION DOSE REDUCTION: This exam was performed according to the departmental dose-optimization program which includes automated exposure control, adjustment of the mA and/or kV according to patient size and/or use of iterative reconstruction technique. CONTRAST:  131mL OMNIPAQUE IOHEXOL 350 MG/ML SOLN COMPARISON:  AP chest 01/20/2022; right upper quadrant abdominal ultrasound.; CT abdomen and pelvis 07/11/2021; MRI abdomen 03/17/2021 FINDINGS: Lower chest: There is chronic elevation of the left hemidiaphragm with superior most aspect of the  left hemidiaphragm/left upper abdominal quadrant incompletely imaged. The lung bases are clear. Hepatobiliary: There is diffuse moderate decreased density seen throughout the liver suggesting fatty infiltration. Smooth liver contours. There is a low-density 2.1 x 1.4 x 2.3 cm (transverse by AP by craniocaudal) lesion within the anterior  inferior aspect of the medial segment of the left hepatic lobe (axial image 27 and coronal image 11), not significantly changed in size from 07/06/2020 measured in a similar manner. This may represent an area of focal fatty infiltration or a cyst. The gallbladder is moderately distended but retains a normal thin wall without definite inflammatory change. Pancreas: There is mild fat stranding around and haziness within the region of the pancreatic head likely secondary to the process in the third portion of the duodenum. Spleen: The superior aspect of the spleen is not imaged but the spleen is otherwise unremarkable. Adrenals/Urinary Tract: Normal adrenals. There is slightly decreased enhancement of the right kidney with respect to the left kidney. No hydronephrosis. No focal urinary bladder wall thickening. Stomach/Bowel: Mild sigmoid and distal diffuse descending colon wall thickening may be due to underdistention versus mild colitis. There is high-grade decompression of the transverse colon which is difficult to differentiate from adjacent ascites. Normal appendix (axial series 3, images 51 through 62). There is abnormal wall thickening of the first second third and fourth portions of the duodenum with the highest grade thickening seen within the posterior third portion of the duodenal wall measuring up to 4.3 cm in AP dimension (axial series 2, image 35). This is highly concerning for "pseudoaneurysmal dilatation of the small bowel" as can be seen with lymphoma. There is also apparent thickening of the ascending greater than transverse colon wall and with surrounding mesenteric fluid and inflammatory fat stranding in the ascending colon is not well visualized, with suspected moderate wall thickening. Right upper quadrant linear air (axial series 2, images 50-58) is favored to represent air within narrowed distal ascending colon. No definite pneumatosis. No definite abnormality is seen within the terminal ileum.  Vascular/Lymphatic: No abdominal aortic aneurysm. The major intra-abdominal aortic branch vessels are patent. There is high-grade thickening of the wall of the third portion of the duodenum. No separate enlarged individual mesenteric retroperitoneal or pelvic lymph nodes are identified. Reproductive: The prostate measures up to 5.0 cm in transverse dimension, moderately distended. The seminal vesicles are grossly unremarkable. Other: No ventral abdominal wall hernia is seen. There is mild-to-moderate free fluid within the right hemiabdomen mesentery greatest in the region of the right lower quadrant posterior to the ascending colon. No pneumoperitoneum is identified. Musculoskeletal: There is transitional lumbosacral anatomy with 6 non-rib-bearing vertebral bodies, considered L1 through L6. Partial sacralization of L6. Rudimentary L6-S1 disc. Moderate disc space narrowing and anterior endplate osteophytes of the lower thoracic spine. IMPRESSION: 1. The third portion of the duodenum and ascending colon are markedly abnormal. There appears to be high-grade mass-like wall thickening of the posterior greater than anterior aspects of the third portion of the duodenum. There is ascites greatest within the right lower quadrant, surrounding the ascending colon which is apparently decompressed with more mild to moderate thick wall. The more distal colon is decompressed which may be the cause for apparent wall thickening in these regions. No definite bowel perforation is seen, although note is made that the ascending colon is difficult to visualize, and on axial images 50 through 58 there is air that is favored to be within narrow distal ascending colon lumen rather than extraluminal air. In  addition to small bowel masses such as lymphoma, inflammatory bowel disease is considered but felt less likely. Note is made that these regions of bowel were normal in appearance on 07/11/2021 prior CT. 2. Moderate fatty infiltration of  the liver. Critical Value/emergent results were called by telephone at the time of interpretation on 04/16/2022 at 12:53 pm to provider RILEY RANSOM and Marda Stalker, MD , who verbally acknowledged these results. Electronically Signed   By: Yvonne Kendall M.D.   On: 04/16/2022 12:56   CT Head Wo Contrast  Result Date: 04/16/2022 CLINICAL DATA:  Found down EXAM: CT HEAD WITHOUT CONTRAST CT CERVICAL SPINE WITHOUT CONTRAST TECHNIQUE: Multidetector CT imaging of the head and cervical spine was performed following the standard protocol without intravenous contrast. Multiplanar CT image reconstructions of the cervical spine were also generated. RADIATION DOSE REDUCTION: This exam was performed according to the departmental dose-optimization program which includes automated exposure control, adjustment of the mA and/or kV according to patient size and/or use of iterative reconstruction technique. COMPARISON:  03/12/2022 CT head 09/22/2021 CT cervical spine FINDINGS: CT HEAD FINDINGS Brain: No evidence of acute infarction, hemorrhage, cerebral edema, mass, mass effect, or midline shift. No hydrocephalus or extra-axial fluid collection. Vascular: No hyperdense vessel. Skull: Normal. Negative for fracture or focal lesion. Sinuses/Orbits: No acute finding. Other: The mastoid air cells are well aerated. CT CERVICAL SPINE FINDINGS Alignment: Trace retrolisthesis of C3 on C4, C4 on C5, and C6 on C7, which appear unchanged compared to 09/22/2021. Normal facet alignment. Skull base and vertebrae: No acute fracture or suspicious osseous lesion. Soft tissues and spinal canal: No prevertebral fluid or swelling. No visible canal hematoma. Disc levels: Mild degenerative changes without high-grade spinal canal stenosis or neural foraminal narrowing. Upper chest: No focal pulmonary opacity or pleural effusion. Other: None. IMPRESSION: 1.  No acute intracranial process. 2.  No acute fracture or traumatic listhesis in the cervical  spine. Electronically Signed   By: Merilyn Baba M.D.   On: 04/16/2022 12:22   CT Cervical Spine Wo Contrast  Result Date: 04/16/2022 CLINICAL DATA:  Found down EXAM: CT HEAD WITHOUT CONTRAST CT CERVICAL SPINE WITHOUT CONTRAST TECHNIQUE: Multidetector CT imaging of the head and cervical spine was performed following the standard protocol without intravenous contrast. Multiplanar CT image reconstructions of the cervical spine were also generated. RADIATION DOSE REDUCTION: This exam was performed according to the departmental dose-optimization program which includes automated exposure control, adjustment of the mA and/or kV according to patient size and/or use of iterative reconstruction technique. COMPARISON:  03/12/2022 CT head 09/22/2021 CT cervical spine FINDINGS: CT HEAD FINDINGS Brain: No evidence of acute infarction, hemorrhage, cerebral edema, mass, mass effect, or midline shift. No hydrocephalus or extra-axial fluid collection. Vascular: No hyperdense vessel. Skull: Normal. Negative for fracture or focal lesion. Sinuses/Orbits: No acute finding. Other: The mastoid air cells are well aerated. CT CERVICAL SPINE FINDINGS Alignment: Trace retrolisthesis of C3 on C4, C4 on C5, and C6 on C7, which appear unchanged compared to 09/22/2021. Normal facet alignment. Skull base and vertebrae: No acute fracture or suspicious osseous lesion. Soft tissues and spinal canal: No prevertebral fluid or swelling. No visible canal hematoma. Disc levels: Mild degenerative changes without high-grade spinal canal stenosis or neural foraminal narrowing. Upper chest: No focal pulmonary opacity or pleural effusion. Other: None. IMPRESSION: 1.  No acute intracranial process. 2.  No acute fracture or traumatic listhesis in the cervical spine. Electronically Signed   By: Merilyn Baba M.D.   On:  04/16/2022 12:22    Assessment / Plan:  26) 36 year old male with N/V, "black" diarrhea and abdominal pain x 2 to 3 days. WBC 6.5. Elevated  lipase level.  AST 50. Lactic acid level pending CTAP with contrast identified a high-grade masslike wall thickening to the third portion of the duodenum, mild fat stranding/haziness within the region of the pancreatic head, the ascending colon appeared markedly abnormal, ascites within the RLQ surrounding the ascending colon which is decompressed without evidence of a perforation. WBC 4.2. Hg 12 -> 9.1. Stool guaiac negative per rectal exam. No N/V or abdominal pain today. -NPO with ice chips -Enteroscopy to rule out UGI malignancy/lymphoma, tentatively scheduled tomorrow. Consent will need to be obtained when patient is not sedated. Our GI service will check on him tomorrow and will clarify if he is appropriate for enteroscopy or not, may need to reschedule to Sunday. -Continue IV fluids  -Ondansetron 4mg  IV every 6 to 8 hours as needed -PPI IV twice daily -Pain management per the hospitalist -CBC, CMP in am   2) Alcohol use disorder.  -CIWA protocol in process -Folic acid 1 mg daily, thiamine 100 mg p.o. daily multivitamin 1 tab daily -Monitor neuro status closely  3) Thrombocytopenia    4) HIV positive on Biktarvy, off Biktarvy x 1 week   5) History of alcohol associated seizure, off Lamictal x 1 week   Await further recommendations per Dr. Rush Landmark     Principal Problem:   Alcohol-induced pancreatitis Active Problems:   HIV disease ([place])   Alcohol use disorder, severe, dependence ([place])   Hypocalcemia   Transaminitis   Duodenal mass   Metabolic acidosis with increased anion gap and accumulation of organic acids   History of seizures   Prolonged QT interval   Tobacco abuse     LOS: 1 day   Noralyn Pick  04/17/2022, 12:24 PM

## 2022-04-17 NOTE — Progress Notes (Signed)
Subjective: Patient with some abdominal pain today, but improved from yesterday.  Denies any N/V.  + flatus.  ROS: See above, otherwise other systems negative  Objective: Vital signs in last 24 hours: Temp:  [97.9 F (36.6 C)-98.9 F (37.2 C)] 98.1 F (36.7 C) (06/09 0500) Pulse Rate:  [109-135] 121 (06/09 0500) Resp:  [14-42] 16 (06/09 0200) BP: (70-145)/(59-105) 137/86 (06/09 0500) SpO2:  [95 %-100 %] 95 % (06/09 0500) Weight:  [70.5 kg] 70.5 kg (06/08 2237)    Intake/Output from previous day: No intake/output data recorded. Intake/Output this shift: No intake/output data recorded.  PE: Gen: NAD, laying in bed Heart: tachy, but regular Lungs: CTAB Abd: soft, much less tender today, still with some epigastric tenderness with some mild voluntary guarding, +BS, ND  Lab Results:  Recent Labs    04/16/22 1037 04/16/22 1108 04/17/22 0430  WBC 6.5  --  4.2  HGB 12.0* 12.9* 9.1*  HCT 36.6* 38.0* 26.1*  PLT 170  --  113*   BMET Recent Labs    04/16/22 1730 04/17/22 0430  NA 140 137  K 4.6 4.2  CL 104 104  CO2 16* 23  GLUCOSE 102* 121*  BUN 9 8  CREATININE 0.94 0.87  CALCIUM 8.0* 8.4*   PT/INR No results for input(s): "LABPROT", "INR" in the last 72 hours. CMP     Component Value Date/Time   NA 137 04/17/2022 0430   NA 138 11/15/2014 1810   K 4.2 04/17/2022 0430   K 4.1 11/15/2014 1810   CL 104 04/17/2022 0430   CL 100 11/15/2014 1810   CO2 23 04/17/2022 0430   CO2 30 11/15/2014 1810   GLUCOSE 121 (H) 04/17/2022 0430   GLUCOSE 115 (H) 11/15/2014 1810   BUN 8 04/17/2022 0430   BUN 6 (L) 11/15/2014 1810   CREATININE 0.87 04/17/2022 0430   CREATININE 0.93 03/20/2021 1641   CALCIUM 8.4 (L) 04/17/2022 0430   CALCIUM 9.4 11/15/2014 1810   PROT 5.8 (L) 04/17/2022 0430   PROT 8.6 (H) 11/15/2014 1810   ALBUMIN 3.5 04/17/2022 0430   ALBUMIN 4.7 11/15/2014 1810   AST 29 04/17/2022 0430   AST 42 (H) 11/15/2014 1810   ALT 25 04/17/2022 0430   ALT  61 11/15/2014 1810   ALKPHOS 34 (L) 04/17/2022 0430   ALKPHOS 64 11/15/2014 1810   BILITOT 1.4 (H) 04/17/2022 0430   BILITOT 0.4 11/15/2014 1810   GFRNONAA >60 04/17/2022 0430   GFRNONAA 107 03/20/2021 1641   GFRAA 124 03/20/2021 1641   Lipase     Component Value Date/Time   LIPASE 719 (H) 04/17/2022 0509       Studies/Results: DG Chest Port 1 View  Result Date: 04/16/2022 CLINICAL DATA:  Alcohol withdrawal.  Epilepsy. EXAM: PORTABLE CHEST 1 VIEW COMPARISON:  Chest radiograph 01/20/2022 FINDINGS: Chronic elevation of the left hemidiaphragm. Otherwise, the lungs are clear. Heart size is within normal limits and stable. Trachea is midline. Negative for a pneumothorax. No acute bone abnormality. IMPRESSION: No active disease. Electronically Signed   By: Richarda Overlie M.D.   On: 04/16/2022 13:29   CT ABDOMEN PELVIS W CONTRAST  Result Date: 04/16/2022 CLINICAL DATA:  Acute nonlocalized abdominal pain. Nausea, vomiting, and diarrhea onset a few weeks ago. Regular heavy alcohol use. Found down coronal week and hypotensive. EXAM: CT ABDOMEN AND PELVIS WITH CONTRAST TECHNIQUE: Multidetector CT imaging of the abdomen and pelvis was performed using the standard protocol following bolus administration  of intravenous contrast. RADIATION DOSE REDUCTION: This exam was performed according to the departmental dose-optimization program which includes automated exposure control, adjustment of the mA and/or kV according to patient size and/or use of iterative reconstruction technique. CONTRAST:  OMNIPAQUE IOHEXOL 350 MG/ML SOLN COMPARISON:  AP chest 01/20/2022; right upper quadrant abdominal ultrasound.; CT abdomen and pelvis 07/11/2021; MRI abdomen 03/17/2021 FINDINGS: Lower chest: There is chronic elevation of the left hemidiaphragm with superior most aspect of the left hemidiaphragm/left upper abdominal quadrant incompletely imaged. The lung bases are clear. Hepatobiliary: There is diffuse moderate  decreased density seen throughout the liver suggesting fatty infiltration. Smooth liver contours. There is a low-density 2.1 x 1.4 x 2.3 cm (transverse by AP by craniocaudal) lesion within the anterior inferior aspect of the medial segment of the left hepatic lobe (axial image 27 and coronal image 11), not significantly changed in size from 07/06/2020 measured in a similar manner. This may represent an area of focal fatty infiltration or a cyst. The gallbladder is moderately distended but retains a normal thin wall without definite inflammatory change. Pancreas: There is mild fat stranding around and haziness within the region of the pancreatic head likely secondary to the process in the third portion of the duodenum. Spleen: The superior aspect of the spleen is not imaged but the spleen is otherwise unremarkable. Adrenals/Urinary Tract: Normal adrenals. There is slightly decreased enhancement of the right kidney with respect to the left kidney. No hydronephrosis. No focal urinary bladder wall thickening. Stomach/Bowel: Mild sigmoid and distal diffuse descending colon wall thickening may be due to underdistention versus mild colitis. There is high-grade decompression of the transverse colon which is difficult to differentiate from adjacent ascites. Normal appendix (axial series 3, images 51 through 62). There is abnormal wall thickening of the first second third and fourth portions of the duodenum with the highest grade thickening seen within the posterior third portion of the duodenal wall measuring up to 4.3 cm in AP dimension (axial series 2, image 35). This is highly concerning for "pseudoaneurysmal dilatation of the small bowel" as can be seen with lymphoma. There is also apparent thickening of the ascending greater than transverse colon wall and with surrounding mesenteric fluid and inflammatory fat stranding in the ascending colon is not well visualized, with suspected moderate wall thickening. Right upper  quadrant linear air (axial series 2, images 50-58) is favored to represent air within narrowed distal ascending colon. No definite pneumatosis. No definite abnormality is seen within the terminal ileum. Vascular/Lymphatic: No abdominal aortic aneurysm. The major intra-abdominal aortic branch vessels are patent. There is high-grade thickening of the wall of the third portion of the duodenum. No separate enlarged individual mesenteric retroperitoneal or pelvic lymph nodes are identified. Reproductive: The prostate measures up to 5.0 cm in transverse dimension, moderately distended. The seminal vesicles are grossly unremarkable. Other: No ventral abdominal wall hernia is seen. There is mild-to-moderate free fluid within the right hemiabdomen mesentery greatest in the region of the right lower quadrant posterior to the ascending colon. No pneumoperitoneum is identified. Musculoskeletal: There is transitional lumbosacral anatomy with 6 non-rib-bearing vertebral bodies, considered L1 through L6. Partial sacralization of L6. Rudimentary L6-S1 disc. Moderate disc space narrowing and anterior endplate osteophytes of the lower thoracic spine. IMPRESSION: 1. The third portion of the duodenum and ascending colon are markedly abnormal. There appears to be high-grade mass-like wall thickening of the posterior greater than anterior aspects of the third portion of the duodenum. There is ascites greatest within the  right lower quadrant, surrounding the ascending colon which is apparently decompressed with more mild to moderate thick wall. The more distal colon is decompressed which may be the cause for apparent wall thickening in these regions. No definite bowel perforation is seen, although note is made that the ascending colon is difficult to visualize, and on axial images 50 through 58 there is air that is favored to be within narrow distal ascending colon lumen rather than extraluminal air. In addition to small bowel masses such  as lymphoma, inflammatory bowel disease is considered but felt less likely. Note is made that these regions of bowel were normal in appearance on 07/11/2021 prior CT. 2. Moderate fatty infiltration of the liver. Critical Value/emergent results were called by telephone at the time of interpretation on 04/16/2022 at 12:53 pm to provider RILEY RANSOM and Lynden Oxford, MD , who verbally acknowledged these results. Electronically Signed   By: Neita Garnet M.D.   On: 04/16/2022 12:56   CT Head Wo Contrast  Result Date: 04/16/2022 CLINICAL DATA:  Found down EXAM: CT HEAD WITHOUT CONTRAST CT CERVICAL SPINE WITHOUT CONTRAST TECHNIQUE: Multidetector CT imaging of the head and cervical spine was performed following the standard protocol without intravenous contrast. Multiplanar CT image reconstructions of the cervical spine were also generated. RADIATION DOSE REDUCTION: This exam was performed according to the departmental dose-optimization program which includes automated exposure control, adjustment of the mA and/or kV according to patient size and/or use of iterative reconstruction technique. COMPARISON:  03/12/2022 CT head 09/22/2021 CT cervical spine FINDINGS: CT HEAD FINDINGS Brain: No evidence of acute infarction, hemorrhage, cerebral edema, mass, mass effect, or midline shift. No hydrocephalus or extra-axial fluid collection. Vascular: No hyperdense vessel. Skull: Normal. Negative for fracture or focal lesion. Sinuses/Orbits: No acute finding. Other: The mastoid air cells are well aerated. CT CERVICAL SPINE FINDINGS Alignment: Trace retrolisthesis of C3 on C4, C4 on C5, and C6 on C7, which appear unchanged compared to 09/22/2021. Normal facet alignment. Skull base and vertebrae: No acute fracture or suspicious osseous lesion. Soft tissues and spinal canal: No prevertebral fluid or swelling. No visible canal hematoma. Disc levels: Mild degenerative changes without high-grade spinal canal stenosis or neural  foraminal narrowing. Upper chest: No focal pulmonary opacity or pleural effusion. Other: None. IMPRESSION: 1.  No acute intracranial process. 2.  No acute fracture or traumatic listhesis in the cervical spine. Electronically Signed   By: Wiliam Ke M.D.   On: 04/16/2022 12:22   CT Cervical Spine Wo Contrast  Result Date: 04/16/2022 CLINICAL DATA:  Found down EXAM: CT HEAD WITHOUT CONTRAST CT CERVICAL SPINE WITHOUT CONTRAST TECHNIQUE: Multidetector CT imaging of the head and cervical spine was performed following the standard protocol without intravenous contrast. Multiplanar CT image reconstructions of the cervical spine were also generated. RADIATION DOSE REDUCTION: This exam was performed according to the departmental dose-optimization program which includes automated exposure control, adjustment of the mA and/or kV according to patient size and/or use of iterative reconstruction technique. COMPARISON:  03/12/2022 CT head 09/22/2021 CT cervical spine FINDINGS: CT HEAD FINDINGS Brain: No evidence of acute infarction, hemorrhage, cerebral edema, mass, mass effect, or midline shift. No hydrocephalus or extra-axial fluid collection. Vascular: No hyperdense vessel. Skull: Normal. Negative for fracture or focal lesion. Sinuses/Orbits: No acute finding. Other: The mastoid air cells are well aerated. CT CERVICAL SPINE FINDINGS Alignment: Trace retrolisthesis of C3 on C4, C4 on C5, and C6 on C7, which appear unchanged compared to 09/22/2021. Normal facet alignment. Skull  base and vertebrae: No acute fracture or suspicious osseous lesion. Soft tissues and spinal canal: No prevertebral fluid or swelling. No visible canal hematoma. Disc levels: Mild degenerative changes without high-grade spinal canal stenosis or neural foraminal narrowing. Upper chest: No focal pulmonary opacity or pleural effusion. Other: None. IMPRESSION: 1.  No acute intracranial process. 2.  No acute fracture or traumatic listhesis in the cervical  spine. Electronically Signed   By: Wiliam KeAlison  Vasan M.D.   On: 04/16/2022 12:22    Anti-infectives: Anti-infectives (From admission, onward)    None        Assessment/Plan Abdominal pain/pancreatitis/ high-grade mass-like wall thickening of the 3rd portion of duodenum -unclear etiology, ? Lymphoma vs some other infiltrative process -lipase up today to 719 from 628 -abdominal pain is somewhat improved from yesterday, but would still recommend NPO due to pancreatitis and pain. -no acute surgical indications at this time -await timing of endoscopic evaluation -TB slightly up to 1.4 today, other LFTs remains normal -WBC remains normal   FEN - NPO/IVFs/CIWA VTE - per primary ID - not currently warranted   HIV (off meds as his meds were stolen) ETOH abuse, significant, likely already in DTs - seems improved some this am. H/O seizure DO, related to ETOH abuse Tobacco abuse (3-4 cigs/day) H/O polysubstance abuse (denies current drug use) Homeless (obtains money by pan handling only) AKI - IVFs, resolved Anemia - hgb down to 9.1 after hydration, no evidence of bleeding currently  I reviewed Consultant GI notes, hospitalist notes, last 24 h vitals and pain scores, last 48 h intake and output, last 24 h labs and trends, and last 24 h imaging results.   LOS: 1 day    Letha CapeKelly E Maleah Rabago , [facility] 04/17/2022, 9:29 AM Please see Amion for pager number during day hours 7:00am-4:30pm or 7:00am -11:30am on weekends

## 2022-04-18 LAB — CBC
HCT: 23.6 % — ABNORMAL LOW (ref 39.0–52.0)
Hemoglobin: 7.7 g/dL — ABNORMAL LOW (ref 13.0–17.0)
MCH: 33.6 pg (ref 26.0–34.0)
MCHC: 32.6 g/dL (ref 30.0–36.0)
MCV: 103.1 fL — ABNORMAL HIGH (ref 80.0–100.0)
Platelets: 90 10*3/uL — ABNORMAL LOW (ref 150–400)
RBC: 2.29 MIL/uL — ABNORMAL LOW (ref 4.22–5.81)
RDW: 13.9 % (ref 11.5–15.5)
WBC: 3.2 10*3/uL — ABNORMAL LOW (ref 4.0–10.5)
nRBC: 0 % (ref 0.0–0.2)

## 2022-04-18 LAB — COMPREHENSIVE METABOLIC PANEL
ALT: 19 U/L (ref 0–44)
AST: 29 U/L (ref 15–41)
Albumin: 3.2 g/dL — ABNORMAL LOW (ref 3.5–5.0)
Alkaline Phosphatase: 29 U/L — ABNORMAL LOW (ref 38–126)
Anion gap: 11 (ref 5–15)
BUN: 8 mg/dL (ref 6–20)
CO2: 26 mmol/L (ref 22–32)
Calcium: 8.4 mg/dL — ABNORMAL LOW (ref 8.9–10.3)
Chloride: 99 mmol/L (ref 98–111)
Creatinine, Ser: 0.74 mg/dL (ref 0.61–1.24)
GFR, Estimated: >60 mL/min (ref >60)
Glucose, Bld: 86 mg/dL (ref 70–99)
Potassium: 3.4 mmol/L — ABNORMAL LOW (ref 3.5–5.1)
Sodium: 136 mmol/L (ref 135–145)
Total Bilirubin: 1.3 mg/dL — ABNORMAL HIGH (ref 0.3–1.2)
Total Protein: 5.3 g/dL — ABNORMAL LOW (ref 6.5–8.1)

## 2022-04-18 LAB — PHOSPHORUS: Phosphorus: 1.9 mg/dL — ABNORMAL LOW (ref 2.5–4.6)

## 2022-04-18 LAB — MAGNESIUM: Magnesium: 2.1 mg/dL (ref 1.7–2.4)

## 2022-04-18 MED ORDER — DEXTROSE 5 % IV SOLN
30.0000 mmol | Freq: Once | INTRAVENOUS | Status: AC
  Administered 2022-04-18: 30 mmol via INTRAVENOUS
  Filled 2022-04-18: qty 10

## 2022-04-18 NOTE — Progress Notes (Signed)
     [facility] Progress Note  CC:  Abdominal pain and possible duodenal mass  Subjective:  No new complaints.  Was supposed to have and EGD/enteroscopy today but then it was canceled due to anesthesia availability.  Objective:  Vital signs in last 24 hours: Temp:  [98.4 F (36.9 C)-99.6 F (37.6 C)] 99.6 F (37.6 C) (06/10 1224) Pulse Rate:  [95-114] 95 (06/10 1224) Resp:  [16-22] 19 (06/10 1224) BP: (100-123)/(73-91) 100/73 (06/10 1224) SpO2:  [93 %] 93 % (06/09 2158)   General:  Alert but sleepy, thin in NAD  Intake/Output from previous day: 06/09 0701 - 06/10 0700 In: 2063.6 [I.V.:1800; IV Piggyback:263.6] Out: -   Lab Results: Recent Labs    04/16/22 1037 04/16/22 1108 04/17/22 0430 04/18/22 0132  WBC 6.5  --  4.2 3.2*  HGB 12.0* 12.9* 9.1* 7.7*  HCT 36.6* 38.0* 26.1* 23.6*  PLT 170  --  113* 90*   BMET Recent Labs    04/16/22 1730 04/17/22 0430 04/18/22 0132  NA 140 137 136  K 4.6 4.2 3.4*  CL 104 104 99  CO2 16* 23 26  GLUCOSE 102* 121* 86  BUN 9 8 8   CREATININE 0.94 0.87 0.74  CALCIUM 8.0* 8.4* 8.4*   LFT Recent Labs    04/18/22 0132  PROT 5.3*  ALBUMIN 3.2*  AST 29  ALT 19  ALKPHOS 29*  BILITOT 1.3*   Assessment / Plan: 43) 36 year old male with N/V, "black" diarrhea and abdominal pain x 2 to 3 days. WBC 6.5. Elevated lipase level.  AST 50. Lactic acid level pending CTAP with contrast identified a high-grade masslike wall thickening to the third portion of the duodenum, mild fat stranding/haziness within the region of the pancreatic head, the ascending colon appeared markedly abnormal, ascites within the RLQ surrounding the ascending colon which is decompressed without evidence of a perforation. WBC 4.2. Hg 12 -> 9.1. Stool guaiac negative per rectal exam. -Enteroscopy to rule out UGI malignancy/lymphoma, was supposed to have this today but it got cancelled due to anesthesia availability.  Will allow him to try clear liquids if  he wishes and will re-attempt if time allows on 6/11, otherwise will have to wait until 6/12.   -Continue IV fluids  -Ondansetron 4mg  IV every 6 to 8 hours as needed -PPI IV twice daily -Pain management per the hospitalist.   2) Alcohol use disorder.  -CIWA protocol in process -Folic acid 1 mg daily, thiamine 100 mg p.o. daily multivitamin 1 tab daily -Monitor neuro status closely   3) Thrombocytopenia    4) HIV positive on Biktarvy, off Biktarvy x 1 week   5) History of alcohol associated seizure, off Lamictal x 1 week     LOS: 2 days   Cherine Drumgoole D. Jasper Ruminski  04/18/2022, 1:43 PM

## 2022-04-18 NOTE — H&P (View-Only) (Signed)
     [facility] Progress Note  CC:  Abdominal pain and possible duodenal mass  Subjective:  No new complaints.  Was supposed to have and EGD/enteroscopy today but then it was canceled due to anesthesia availability.  Objective:  Vital signs in last 24 hours: Temp:  [98.4 F (36.9 C)-99.6 F (37.6 C)] 99.6 F (37.6 C) (06/10 1224) Pulse Rate:  [95-114] 95 (06/10 1224) Resp:  [16-22] 19 (06/10 1224) BP: (100-123)/(73-91) 100/73 (06/10 1224) SpO2:  [93 %] 93 % (06/09 2158)   General:  Alert but sleepy, thin in NAD  Intake/Output from previous day: 06/09 0701 - 06/10 0700 In: 2063.6 [I.V.:1800; IV Piggyback:263.6] Out: -   Lab Results: Recent Labs    04/16/22 1037 04/16/22 1108 04/17/22 0430 04/18/22 0132  WBC 6.5  --  4.2 3.2*  HGB 12.0* 12.9* 9.1* 7.7*  HCT 36.6* 38.0* 26.1* 23.6*  PLT 170  --  113* 90*   BMET Recent Labs    04/16/22 1730 04/17/22 0430 04/18/22 0132  NA 140 137 136  K 4.6 4.2 3.4*  CL 104 104 99  CO2 16* 23 26  GLUCOSE 102* 121* 86  BUN 9 8 8  CREATININE 0.94 0.87 0.74  CALCIUM 8.0* 8.4* 8.4*   LFT Recent Labs    04/18/22 0132  PROT 5.3*  ALBUMIN 3.2*  AST 29  ALT 19  ALKPHOS 29*  BILITOT 1.3*   Assessment / Plan: 1) 35 year old male with N/V, "black" diarrhea and abdominal pain x 2 to 3 days. WBC 6.5. Elevated lipase level.  AST 50. Lactic acid level pending CTAP with contrast identified a high-grade masslike wall thickening to the third portion of the duodenum, mild fat stranding/haziness within the region of the pancreatic head, the ascending colon appeared markedly abnormal, ascites within the RLQ surrounding the ascending colon which is decompressed without evidence of a perforation. WBC 4.2. Hg 12 -> 9.1. Stool guaiac negative per rectal exam. -Enteroscopy to rule out UGI malignancy/lymphoma, was supposed to have this today but it got cancelled due to anesthesia availability.  Will allow him to try clear liquids if  he wishes and will re-attempt if time allows on 6/11, otherwise will have to wait until 6/12.   -Continue IV fluids  -Ondansetron 4mg IV every 6 to 8 hours as needed -PPI IV twice daily -Pain management per the hospitalist.   2) Alcohol use disorder.  -CIWA protocol in process -Folic acid 1 mg daily, thiamine 100 mg p.o. daily multivitamin 1 tab daily -Monitor neuro status closely   3) Thrombocytopenia    4) HIV positive on Biktarvy, off Biktarvy x 1 week   5) History of alcohol associated seizure, off Lamictal x 1 week     LOS: 2 days   Ronnae Kaser D. Courtez Twaddle  04/18/2022, 1:43 PM    
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
## 2022-04-18 NOTE — Consult Note (Addendum)
[facility]   Reason for Consult:HIV disease   Referring Physician: ghimire  Principal Problem:   Alcohol-induced pancreatitis Active Problems:   HIV disease (HCC)   Alcohol use disorder, severe, dependence (HCC)   Hypocalcemia   Transaminitis   Duodenal mass   Metabolic acidosis with increased anion gap and accumulation of organic acids   History of seizures   Prolonged QT interval   Tobacco abuse    HPI: Gary [name] is a 36 y.o. male with long standing hiv disease, CD 4 count of 309/VL 3,000 in March 2023, on biktarvy. PMHX also with ETOH dependence, seizure disorder, polysubstance abuse admitted for pancreatitis on 6/8 for abd pain, nausea and vomiting/diarrhea. Black stools. On admit, labs of concern included lipase of 628, ct imaging showed concern for duodenal mass as well as ascending colon thickening. He is kept NPO for management of pancreatitis. CIWA protocol to curb alcohol withdrawal. ID asked to weigh in to restarting ART. GI and general surgery involved in order to diagnose duodenal mass and next steps of management.  Soc hx: homeless, lost his hiv meds x 1 wk prior to PTA. ETOH use -15 beers/day  Past Medical History:  Diagnosis Date   Alcohol abuse    HIV (human immunodeficiency virus infection) (HCC)    Seizures (HCC)    Subdural hematoma (HCC)     Allergies:  Allergies  Allergen Reactions   Tegretol [Carbamazepine] Other (See Comments)    Caused vertigo for 2 days after taking it   Caffeine Palpitations    MEDICATIONS:  enoxaparin (LOVENOX) injection  40 mg Subcutaneous Q24H   folic acid  1 mg Oral Daily   LORazepam  0-4 mg Intravenous Q12H   Or   LORazepam  0-4 mg Oral Q12H   multivitamin with minerals  1 tablet Oral Daily   pantoprazole (PROTONIX) IV  40 mg Intravenous Q12H   sodium chloride flush  3 mL Intravenous Q12H   thiamine  100 mg Oral Daily   Or   thiamine  100 mg Intravenous Daily    Social History    Tobacco Use   Smoking status: Every Day    Packs/day: 0.50    Types: Cigarettes   Smokeless tobacco: Never   Tobacco comments:    unable to smoke while incarcerated 6+ months 02/13/20  Vaping Use   Vaping Use: Never used  Substance Use Topics   Alcohol use: Yes    Alcohol/week: 105.0 standard drinks of alcohol    Types: 105 Cans of beer per week    Comment: 15 beers/day (105 beers/week), 1 fifth of liquor per day (7/week)   Drug use: Yes    Types: Methamphetamines, Marijuana    Comment: last used 04/10/2019    Family History  Problem Relation Age of Onset   Alcohol abuse Mother    Alcohol abuse Father    Colon cancer Other    Other Other    Cancer Other     Review of Systems -   Constitutional: Negative for fever, chills, diaphoresis, activity change, appetite change, fatigue and unexpected weight change.  HENT: Negative for congestion, sore throat, rhinorrhea, sneezing, trouble swallowing and sinus pressure.  Eyes: Negative for photophobia and visual disturbance.  Respiratory: Negative for cough, chest tightness, shortness of breath, wheezing and stridor.  Cardiovascular: Negative for chest pain, palpitations and leg swelling.  Gastrointestinal: +for nausea, vomiting, abdominal pain, diarrhea, constipation, blood in stool, abdominal distention and anal bleeding.  Genitourinary: Negative for dysuria, hematuria, flank pain and difficulty urinating.  Musculoskeletal: Negative for myalgias, back pain, joint swelling, arthralgias and gait problem.  Skin: Negative for color change, pallor, rash and wound.  Neurological: Negative for dizziness, tremors, weakness and light-headedness.  Hematological: Negative for adenopathy. Does not bruise/bleed easily.  Psychiatric/Behavioral: Negative for behavioral problems, confusion, sleep disturbance, dysphoric mood, decreased concentration and agitation.     OBJECTIVE: Temp:  [98.4 F (36.9 C)-99.6 F (37.6 C)] 99.6 F (37.6 C)  (06/10 1224) Pulse Rate:  [95-114] 95 (06/10 1224) Resp:  [16-22] 19 (06/10 1224) BP: (100-123)/(73-91) 100/73 (06/10 1224) SpO2:  [93 %] 93 % (06/09 2158) Physical Exam  Constitutional: He is oriented to person, place, and time. He appears chronically ill, cachetic,No distress. Smells of ketones HENT:  Mouth/Throat: Oropharynx is clear and moist. No oropharyngeal exudate.  Cardiovascular: Normal rate, regular rhythm and normal heart sounds. Exam reveals no gallop and no friction rub.  No murmur heard.  Pulmonary/Chest: Effort normal and breath sounds normal. No respiratory distress. He has no wheezes.  Abdominal: Soft. Bowel sounds are normal. He exhibits no distension. There is no tenderness.  Lymphadenopathy:  He has no cervical adenopathy.  Neurological: He is alert and oriented to person, place, and time.  Skin: Skin is warm and dry. No rash noted. No erythema.  Psychiatric: He has a normal mood and affect. His behavior is normal.    LABS: Results for orders placed or performed during the hospital encounter of 04/16/22 (from the past 48 hour(s))  Lactic acid, plasma     Status: Abnormal   Collection Time: 04/16/22  3:35 PM  Result Value Ref Range   Lactic Acid, Venous 4.6 (HH) 0.5 - 1.9 mmol/L    Comment: CRITICAL VALUE NOTED.  VALUE IS CONSISTENT WITH PREVIOUSLY REPORTED AND CALLED VALUE. Performed at [facility], [address] [place], [place] [address]   Troponin I (High Sensitivity)     Status: None   Collection Time: 04/16/22  3:35 PM  Result Value Ref Range   Troponin I (High Sensitivity) 7 <18 ng/L    Comment: (NOTE) Elevated high sensitivity troponin I (hsTnI) values and significant  changes across serial measurements may suggest ACS but many other  chronic and acute conditions are known to elevate hsTnI results.  Refer to the "Links" section for chest pain algorithms and additional  guidance. Performed at [facility], [address]  [place], [place] [address]   Occult blood, poc device     Status: Abnormal   Collection Time: 04/16/22  3:46 PM  Result Value Ref Range   Fecal Occult Bld POSITIVE (A) NEGATIVE  T-helper cells (CD4) count (not at [facility])     Status: Abnormal   Collection Time: 04/16/22  5:30 PM  Result Value Ref Range   CD4 T Cell Abs 244 (L) 400 - 1,790 /uL   CD4 % Helper T Cell 40 33 - 65 %    Comment: Performed at [facility], [address] [place], [place] [address]  Basic metabolic panel     Status: Abnormal   Collection Time: 04/16/22  5:30 PM  Result Value Ref Range   Sodium 140 135 - 145 mmol/L   Potassium 4.6 3.5 - 5.1 mmol/L   Chloride 104 98 - 111 mmol/L   CO2 16 (L) 22 - 32 mmol/L   Glucose, Bld 102 (H) 70 - 99 mg/dL    Comment: Glucose reference range applies  only to samples taken after fasting for at least 8 hours.   BUN 9 6 - 20 mg/dL   Creatinine, Ser 4.090.94 0.61 - 1.24 mg/dL   Calcium 8.0 (L) 8.9 - 10.3 mg/dL   GFR, Estimated >81>60 >19>60 mL/min    Comment: (NOTE) Calculated using the CKD-EPI Creatinine Equation (2021)    Anion gap 20 (H) 5 - 15    Comment: Performed at [facility], [address] [place], [place] [address]  Comprehensive metabolic panel     Status: Abnormal   Collection Time: 04/17/22  4:30 AM  Result Value Ref Range   Sodium 137 135 - 145 mmol/L   Potassium 4.2 3.5 - 5.1 mmol/L   Chloride 104 98 - 111 mmol/L   CO2 23 22 - 32 mmol/L   Glucose, Bld 121 (H) 70 - 99 mg/dL    Comment: Glucose reference range applies only to samples taken after fasting for at least 8 hours.   BUN 8 6 - 20 mg/dL   Creatinine, Ser 9.560.87 0.61 - 1.24 mg/dL   Calcium 8.4 (L) 8.9 - 10.3 mg/dL   Total Protein 5.8 (L) 6.5 - 8.1 g/dL   Albumin 3.5 3.5 - 5.0 g/dL   AST 29 15 - 41 U/L   ALT 25 0 - 44 U/L   Alkaline Phosphatase 34 (L) 38 - 126 U/L   Total Bilirubin 1.4 (H) 0.3 - 1.2 mg/dL   GFR, Estimated >21>60 >30>60 mL/min    Comment: (NOTE) Calculated using the CKD-EPI  Creatinine Equation (2021)    Anion gap 10 5 - 15    Comment: Performed at [facility], [address] [place], [place] [address]  CBC     Status: Abnormal   Collection Time: 04/17/22  4:30 AM  Result Value Ref Range   WBC 4.2 4.0 - 10.5 K/uL   RBC 2.61 (L) 4.22 - 5.81 MIL/uL   Hemoglobin 9.1 (L) 13.0 - 17.0 g/dL   HCT 46.926.1 (L) 62.939.0 - 52.852.0 %   MCV 100.0 80.0 - 100.0 fL   MCH 34.9 (H) 26.0 - 34.0 pg   MCHC 34.9 30.0 - 36.0 g/dL   RDW 41.314.3 24.411.5 - 01.015.5 %   Platelets 113 (L) 150 - 400 K/uL    Comment: Immature Platelet Fraction may be clinically indicated, consider ordering this additional test [id number]    nRBC 0.0 0.0 - 0.2 %    Comment: Performed at [facility], [address] [place], [place] [address]  Magnesium     Status: None   Collection Time: 04/17/22  4:30 AM  Result Value Ref Range   Magnesium 1.8 1.7 - 2.4 mg/dL    Comment: Performed at [facility], [address] [place], [place] [address]  Phosphorus     Status: None   Collection Time: 04/17/22  4:30 AM  Result Value Ref Range   Phosphorus 3.7 2.5 - 4.6 mg/dL    Comment: Performed at [facility], [address] [place], [place] [address]  Lipase, blood     Status: Abnormal   Collection Time: 04/17/22  5:09 AM  Result Value Ref Range   Lipase 719 (H) 11 - 51 U/L    Comment: RESULTS CONFIRMED BY MANUAL DILUTION Performed at [facility], [address] [place], [place] [address]   CBC     Status: Abnormal   Collection Time: 04/18/22  1:32 AM  Result Value Ref  Range   WBC 3.2 (L) 4.0 - 10.5 K/uL   RBC 2.29 (L) 4.22 - 5.81 MIL/uL   Hemoglobin 7.7 (L) 13.0 - 17.0 g/dL   HCT 82.9 (L) 56.2 - 13.0 %   MCV 103.1 (H) 80.0 - 100.0 fL   MCH 33.6 26.0 - 34.0 pg   MCHC 32.6 30.0 - 36.0 g/dL   RDW 86.5 78.4 - 69.6 %   Platelets 90 (L) 150 - 400 K/uL    Comment: Immature Platelet Fraction may be clinically indicated, consider ordering this additional test [id number] CONSISTENT  WITH PREVIOUS RESULT REPEATED TO VERIFY    nRBC 0.0 0.0 - 0.2 %    Comment: Performed at [facility], [address], [place], [place] [address]  Comprehensive metabolic panel     Status: Abnormal   Collection Time: 04/18/22  1:32 AM  Result Value Ref Range   Sodium 136 135 - 145 mmol/L   Potassium 3.4 (L) 3.5 - 5.1 mmol/L    Comment: DELTA CHECK NOTED   Chloride 99 98 - 111 mmol/L   CO2 26 22 - 32 mmol/L   Glucose, Bld 86 70 - 99 mg/dL    Comment: Glucose reference range applies only to samples taken after fasting for at least 8 hours.   BUN 8 6 - 20 mg/dL   Creatinine, Ser 0.27 0.61 - 1.24 mg/dL   Calcium 8.4 (L) 8.9 - 10.3 mg/dL   Total Protein 5.3 (L) 6.5 - 8.1 g/dL   Albumin 3.2 (L) 3.5 - 5.0 g/dL   AST 29 15 - 41 U/L   ALT 19 0 - 44 U/L   Alkaline Phosphatase 29 (L) 38 - 126 U/L   Total Bilirubin 1.3 (H) 0.3 - 1.2 mg/dL   GFR, Estimated >25 >36 mL/min    Comment: (NOTE) Calculated using the CKD-EPI Creatinine Equation (2021)    Anion gap 11 5 - 15    Comment: Performed at [facility], [address] [place], [place] [address]  Magnesium     Status: None   Collection Time: 04/18/22  1:32 AM  Result Value Ref Range   Magnesium 2.1 1.7 - 2.4 mg/dL    Comment: Performed at [facility], [address] [place], [place] [address]  Phosphorus     Status: Abnormal   Collection Time: 04/18/22  1:32 AM  Result Value Ref Range   Phosphorus 1.9 (L) 2.5 - 4.6 mg/dL    Comment: Performed at [facility], [address] [place], [place] [address]    MICRO: reviewed IMAGING:  IMPRESSION: 1. The third portion of the duodenum and ascending colon are markedly abnormal. There appears to be high-grade mass-like wall thickening of the posterior greater than anterior aspects of the third portion of the duodenum. There is ascites greatest within the right lower quadrant, surrounding the ascending colon which is apparently decompressed with more mild to  moderate thick wall. The more distal colon is decompressed which may be the cause for apparent wall thickening in these regions. No definite bowel perforation is seen, although note is made that the ascending colon is difficult to visualize, and on axial images 50 through 58 there is air that is favored to be within narrow distal ascending colon lumen rather than extraluminal air. In addition to small bowel masses such as lymphoma, inflammatory bowel disease is considered but felt less likely. Note is made that these regions of bowel were normal in appearance on  07/11/2021 prior CT. 2. Moderate fatty infiltration of the liver.   Assessment/Plan:  36yo M with poorly controlled hiv disease, cd 4 count 244, VL of 3,000 in march 2023, intermittently taking biktarvy in the setting of pancreatitis found to have duodenal mass.  - will check hiv genotype to see if he has developed resistance to any components of biktarvy - given he has pancreatitis, we will continue to hold on restarting biktarvy at this point until can advance his diet and take medication consistently - he doesn't follow with ID clinic but will see if willing to get into care may need to change his regimen with higher barrier to resistance - will check rpr, ur gc/chlam/hep c  Duodenal mass = agree with need to sample for path for decision for further management  Aspin Palomarez B. Drue Second MD MPH [facility] [phone]

## 2022-04-19 ENCOUNTER — Inpatient Hospital Stay (HOSPITAL_COMMUNITY): Payer: Self-pay | Admitting: Certified Registered"

## 2022-04-19 ENCOUNTER — Encounter (HOSPITAL_COMMUNITY)
Admission: EM | Discharge status: Against Medical Advice | Payer: Self-pay | Source: Home / Self Care | Attending: Internal Medicine

## 2022-04-19 DIAGNOSIS — K766 Portal hypertension: Secondary | ICD-10-CM

## 2022-04-19 DIAGNOSIS — D649 Anemia, unspecified: Secondary | ICD-10-CM

## 2022-04-19 DIAGNOSIS — K3189 Other diseases of stomach and duodenum: Secondary | ICD-10-CM

## 2022-04-19 DIAGNOSIS — B20 Human immunodeficiency virus [HIV] disease: Secondary | ICD-10-CM

## 2022-04-19 HISTORY — PX: ENTEROSCOPY: SHX5533

## 2022-04-19 HISTORY — PX: BIOPSY: SHX5522

## 2022-04-19 LAB — MAGNESIUM: Magnesium: 1.7 mg/dL (ref 1.7–2.4)

## 2022-04-19 LAB — CBC
HCT: 22.2 % — ABNORMAL LOW (ref 39.0–52.0)
Hemoglobin: 7.4 g/dL — ABNORMAL LOW (ref 13.0–17.0)
MCH: 34.1 pg — ABNORMAL HIGH (ref 26.0–34.0)
MCHC: 33.3 g/dL (ref 30.0–36.0)
MCV: 102.3 fL — ABNORMAL HIGH (ref 80.0–100.0)
Platelets: 86 10*3/uL — ABNORMAL LOW (ref 150–400)
RBC: 2.17 MIL/uL — ABNORMAL LOW (ref 4.22–5.81)
RDW: 13 % (ref 11.5–15.5)
WBC: 2.9 10*3/uL — ABNORMAL LOW (ref 4.0–10.5)
nRBC: 0 % (ref 0.0–0.2)

## 2022-04-19 LAB — COMPREHENSIVE METABOLIC PANEL
ALT: 18 U/L (ref 0–44)
AST: 29 U/L (ref 15–41)
Albumin: 2.7 g/dL — ABNORMAL LOW (ref 3.5–5.0)
Alkaline Phosphatase: 33 U/L — ABNORMAL LOW (ref 38–126)
Anion gap: 9 (ref 5–15)
BUN: 5 mg/dL — ABNORMAL LOW (ref 6–20)
CO2: 28 mmol/L (ref 22–32)
Calcium: 8.2 mg/dL — ABNORMAL LOW (ref 8.9–10.3)
Chloride: 96 mmol/L — ABNORMAL LOW (ref 98–111)
Creatinine, Ser: 0.66 mg/dL (ref 0.61–1.24)
GFR, Estimated: >60 mL/min (ref >60)
Glucose, Bld: 85 mg/dL (ref 70–99)
Potassium: 3.1 mmol/L — ABNORMAL LOW (ref 3.5–5.1)
Sodium: 133 mmol/L — ABNORMAL LOW (ref 135–145)
Total Bilirubin: 1 mg/dL (ref 0.3–1.2)
Total Protein: 5 g/dL — ABNORMAL LOW (ref 6.5–8.1)

## 2022-04-19 LAB — PHOSPHORUS: Phosphorus: 3 mg/dL (ref 2.5–4.6)

## 2022-04-19 SURGERY — ENTEROSCOPY
Anesthesia: Monitor Anesthesia Care

## 2022-04-19 MED ORDER — MAGNESIUM SULFATE 4 GM/100ML IV SOLN
4.0000 g | Freq: Once | INTRAVENOUS | Status: AC
  Administered 2022-04-19: 4 g via INTRAVENOUS
  Filled 2022-04-19: qty 100

## 2022-04-19 MED ORDER — PROPOFOL 500 MG/50ML IV EMUL
INTRAVENOUS | Status: DC | PRN
  Administered 2022-04-19: 175 ug/kg/min via INTRAVENOUS

## 2022-04-19 MED ORDER — PROPOFOL 10 MG/ML IV BOLUS
INTRAVENOUS | Status: DC | PRN
  Administered 2022-04-19 (×2): 30 mg via INTRAVENOUS
  Administered 2022-04-19: 100 mg via INTRAVENOUS
  Administered 2022-04-19: 30 mg via INTRAVENOUS

## 2022-04-19 MED ORDER — PHENYLEPHRINE 80 MCG/ML (10ML) SYRINGE FOR IV PUSH (FOR BLOOD PRESSURE SUPPORT)
PREFILLED_SYRINGE | INTRAVENOUS | Status: DC | PRN
  Administered 2022-04-19: 160 ug via INTRAVENOUS

## 2022-04-19 MED ORDER — LIDOCAINE 2% (20 MG/ML) 5 ML SYRINGE
INTRAMUSCULAR | Status: DC | PRN
  Administered 2022-04-19: 100 mg via INTRAVENOUS

## 2022-04-19 MED ORDER — POTASSIUM CHLORIDE 10 MEQ/100ML IV SOLN
10.0000 meq | INTRAVENOUS | Status: AC
  Administered 2022-04-19 (×3): 10 meq via INTRAVENOUS

## 2022-04-19 MED ORDER — SODIUM CHLORIDE 0.9 % IV SOLN: INTRAVENOUS | Status: DC

## 2022-04-19 MED ORDER — MIDAZOLAM HCL 2 MG/2ML IJ SOLN
INTRAMUSCULAR | Status: DC | PRN
  Administered 2022-04-19: 2 mg via INTRAVENOUS

## 2022-04-19 MED ORDER — MIDAZOLAM HCL 2 MG/2ML IJ SOLN
INTRAMUSCULAR | Status: AC
  Filled 2022-04-19: qty 2

## 2022-04-19 NOTE — Interval H&P Note (Signed)
History and Physical Interval Note:  04/19/2022 12:28 PM  Gary [name]  has presented today for surgery, with the diagnosis of Abnormal CT abdomen, nausea, vomiting.  The various methods of treatment have been discussed with the patient and family. After consideration of risks, benefits and other options for treatment, the patient has consented to  Procedure(s): ENTEROSCOPY (N/A) as a surgical intervention.  The patient's history has been reviewed, patient examined, no change in status, stable for surgery.  I have reviewed the patient's chart and labs.  Questions were answered to the patient's satisfaction.     [employer]

## 2022-04-19 NOTE — Anesthesia Postprocedure Evaluation (Addendum)
Anesthesia Post Note  Patient: Gary [name]  Procedure(s) Performed: ENTEROSCOPY BIOPSY     Patient location during evaluation: Endoscopy Anesthesia Type: MAC Level of consciousness: patient cooperative, oriented and awake and alert Pain management: pain level controlled Vital Signs Assessment: post-procedure vital signs reviewed and stable Respiratory status: spontaneous breathing, nonlabored ventilation and respiratory function stable Cardiovascular status: blood pressure returned to baseline and stable Postop Assessment: no apparent nausea or vomiting Anesthetic complications: no   No notable events documented.  Last Vitals:  Vitals:   04/19/22 1405 04/19/22 1420  BP: 100/67 101/69  Pulse: 85   Resp: 20 20  Temp:  (!) 36.4 C  SpO2: 94% 95%    Last Pain:  Vitals:   04/19/22 1420  TempSrc:   PainSc: 0-No pain                 Briselda Naval,E. Eisa Conaway

## 2022-04-19 NOTE — Transfer of Care (Signed)
Immediate Anesthesia Transfer of Care Note  Patient: Gary [name]  Procedure(s) Performed: ENTEROSCOPY BIOPSY  Patient Location: PACU  Anesthesia Type:MAC  Level of Consciousness: drowsy and patient cooperative  Airway & Oxygen Therapy: Patient Spontanous Breathing and Patient connected to nasal cannula oxygen  Post-op Assessment: Report given to RN and Post -op Vital signs reviewed and stable  Post vital signs: Reviewed and stable  Last Vitals:  Vitals Value Taken Time  BP 84/51 04/19/22 1351  Temp    Pulse 87 04/19/22 1352  Resp 14 04/19/22 1352  SpO2 91 % 04/19/22 1352  Vitals shown include unvalidated device data.  Last Pain:  Vitals:   04/19/22 1220  TempSrc: Temporal  PainSc: 8          Complications: No notable events documented.
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
## 2022-04-19 NOTE — Progress Notes (Signed)
Initial Nutrition Assessment  DOCUMENTATION CODES:   Not applicable  INTERVENTION:  Resume diet after EGD, advance as tolerated If it is likely pt will remain on liquids for several more days, would recommend placement of post-pyloric cortrak for enteral feeds. If EN not possible in the setting of intestinal wall thickening TPN could be administered to meet nutrition needs.  Continue vitamin regimen in the setting of EtOH abuse.  NUTRITION DIAGNOSIS:   Increased nutrient needs related to chronic illness (HIV, pancreatitis) as evidenced by estimated needs.   GOAL:   Patient will meet greater than or equal to 90% of their needs   MONITOR:   Diet advancement, I & O's, Labs  REASON FOR ASSESSMENT:   Malnutrition Screening Tool    ASSESSMENT:   Pt with hx of HIV, substance abuse, heavy EtOH use with hx of withdrawal seizures (15 beers and 1/5 vodka daily), homelessness, and chronic pancreatitis presented to ED after a bystander found him barely responsive. Pt reported several days of nausea, vomiting, and diarrhea in ED.  Pt out of room for EGD at the time of assessment. Will defer interview and physical exam to follow-up assessment.   Per chart review,while pt was In ED, imaging showed high-grade mass like thickening in the third portion of the duodenum, a mild-moderate thickening of the ascending colon, and haziness around the pancreatic head. Concern raised for lymphoma in the setting of HIV.  Lipase elevated in the setting of alcohol induced pancreatitis. Pt actively withdrawing and was started on CIWA protocol at that time.   No intake recorded in flowsheet  and limited recent wt hx available. Once pt has completed EGD, would recommend restarting clear liquids and advancing as tolerated for abdominal pain. If it is likely that pt will be unable to have diet advanced past liquids in the next 48 hours, would likely benefit from nutrition support as he is likely malnourished in  the context of his excessive EtOH intake.  Nutritionally Relevant Medications: Scheduled Meds:  folic acid  1 mg Oral Daily   multivitamin with minerals  1 tablet Oral Daily   pantoprazole IV  40 mg Intravenous Q12H   thiamine  100 mg Oral Daily   Continuous Infusions:  sodium chloride     lactated ringers 150 mL/hr at 04/19/22 0856   Labs Reviewed: Sodium 133, Chloride 96 K 3.1 BUN 5 Lipase (6/9) 719  NUTRITION - FOCUSED PHYSICAL EXAM: Defer to in-person assessment  Diet Order:   Diet Order             Diet NPO time specified  Diet effective now                   EDUCATION NEEDS:  Not appropriate for education at this time  Skin:  Skin Assessment: Reviewed RN Assessment  Last BM:  6/9  Height:   Ht Readings from Last 1 Encounters:  04/16/22 6' (1.829 m)    Weight:   Wt Readings from Last 1 Encounters:  04/16/22 70.5 kg    Ideal Body Weight:  80.9 kg  BMI:  Body mass index is 21.08 kg/m.  Estimated Nutritional Needs:   Kcal:  2400-2600 kcal/d  Protein:  120-140 g/d  Fluid:  >2.5L/d    Ranell Patrick, RD, LDN Clinical Dietitian RD pager # available in AMION  After hours/weekend pager # available in [facility]

## 2022-04-19 NOTE — Anesthesia Preprocedure Evaluation (Addendum)
Anesthesia Evaluation  Patient identified by MRN, date of birth, ID band Patient awake    Reviewed: Allergy & Precautions, NPO status , Patient's Chart, lab work & pertinent test results  History of Anesthesia Complications Negative for: history of anesthetic complications  Airway Mallampati: II  TM Distance: >3 FB Neck ROM: Full    Dental  (+) Dental Advisory Given, Teeth Intact   Pulmonary Current Smoker and Patient abstained from smoking.,    breath sounds clear to auscultation       Cardiovascular (-) hypertension Rhythm:Regular Rate:Normal  '22 ECHO: EF 50 to 55%. LV ejection fraction by 3D volume is 53 %. The LV has low normal function, normal RVF, no significant valvular abnormalities   Neuro/Psych Seizures - (seized yesterday), Poorly Controlled,  Depression H/o SDH    GI/Hepatic (+)     substance abuse  alcohol use, marijuana use and methamphetamine use, abd pain   Endo/Other  negative endocrine ROS  Renal/GU negative Renal ROS     Musculoskeletal   Abdominal   Peds  Hematology  (+) Blood dyscrasia (Hb 7.4, plt 86k), anemia , HIV,   Anesthesia Other Findings   Reproductive/Obstetrics                            Anesthesia Physical Anesthesia Plan  ASA: 3  Anesthesia Plan: MAC   Post-op Pain Management: Minimal or no pain anticipated   Induction:   PONV Risk Score and Plan: 0 and Ondansetron  Airway Management Planned: Natural Airway and Nasal Cannula  Additional Equipment: None  Intra-op Plan:   Post-operative Plan:   Informed Consent: I have reviewed the patients History and Physical, chart, labs and discussed the procedure including the risks, benefits and alternatives for the proposed anesthesia with the patient or authorized representative who has indicated his/her understanding and acceptance.     Dental advisory given  Plan Discussed with: CRNA and  Surgeon  Anesthesia Plan Comments:        Anesthesia Quick Evaluation

## 2022-04-19 NOTE — Op Note (Signed)
[facility] Patient Name: Gary [name] Procedure Date : 04/19/2022 MRN: [id number] Attending MD: Justice Britain , MD Date of Birth: May 19, 1986 CSN: [id number] Age: 36 Admit Type: Inpatient Procedure:                Small bowel enteroscopy Indications:              Abnormal abdominal CT, Nausea with vomiting, Portal                            hypertension rule out esophageal varices Providers:                Justice Britain, MD, Jeanella Cara, RN,                            William Dalton, Technician, Lance Coon, CRNA Referring MD:             [facility] Medicines:                Monitored Anesthesia Care Complications:            No immediate complications. Estimated Blood Loss:     Estimated blood loss was minimal. Procedure:                Pre-Anesthesia Assessment:                           - Prior to the procedure, a History and Physical                            was performed, and patient medications and                            allergies were reviewed. The patient's tolerance of                            previous anesthesia was also reviewed. The risks                            and benefits of the procedure and the sedation                            options and risks were discussed with the patient.                            All questions were answered, and informed consent                            was obtained. Prior Anticoagulants: The patient has                            taken no previous anticoagulant or antiplatelet                            agents. ASA Grade Assessment: III - A patient with  severe systemic disease. After reviewing the risks                            and benefits, the patient was deemed in                            satisfactory condition to undergo the procedure.                           After obtaining informed consent, the endoscope was                            passed  under direct vision. Throughout the                            procedure, the patient's blood pressure, pulse, and                            oxygen saturations were monitored continuously. The                            [id number]-H190TL ([id number]) Olympus slim colonoscope was                            introduced through the mouth and advanced to the                            proximal jejunum. The small bowel enteroscopy was                            accomplished without difficulty. The patient                            tolerated the procedure. Scope In: Scope Out: Findings:      No gross lesions were noted in the entire esophagus.      There is no endoscopic evidence of varices in the entire esophagus.      Moderate portal hypertensive gastropathy was found in the cardia, in the       gastric fundus and in the gastric body.      Multiple dispersed diminutive erosions with no bleeding and no stigmata       of recent bleeding were found in the entire examined stomach. Biopsies       were taken with a cold forceps for histology and Helicobacter pylori       testing.      There is no endoscopic evidence of varices in the entire examined       stomach.      Diffuse severe mucosal changes characterized by congestion, friability       (with contact bleeding), hemorrhagic appearance, inflammation and       altered texture were found in the sweep, in the second portion of the       duodenum, in the third portion of the duodenum and in the fourth portion       of the duodenum. Narrowing is noted in this region but the scope passed  through angulations with ease. Biopsies were taken with a cold forceps       for histology to rule out lymphoma or other mucosal process but I have       concern that is a process that is external.      Normal mucosa was found in the proximal jejunum visualized without any       evidence of narrowing. Impression:               - No gross lesions in esophagus.                            - Portal hypertensive gastropathy proximally.                           - Erosive gastropathy with no bleeding and no                            stigmata of recent bleeding throughout - biopsied.                           - Mucosal changes of                            inflammation/congestion/granularity/contact                            friability present throughout the duodenum, causing                            some mild luminal narrowing (though colonoscope                            able to traverse this region). Biopsied to rule out                            mucosal process - concern however, this is more                            extrinsic.                           - Normal mucosa was found in the examined proximal                            jejunum. Recommendation:           - The patient will be observed post-procedure,                            until all discharge criteria are met.                           - Return patient to hospital ward for ongoing care.                           - Clear liquid diet/full liquid diet is reasonable  for now.                           - Await pathology results.                           - If biopsies are non-diagnostic, then recommend                            discussion with IR to attempt percutaneous biopsy                            vs Surgery for laparascopic biopsy.                           - Enteral stenting of this region would require                            multiple telescopic stents, and I'm not sure that                            it is going to not have risk of stent migration.                           - The findings and recommendations were discussed                            with the patient.                           - The findings and recommendations were discussed                            with the referring physician. Procedure Code(s):        --- Professional ---                            [phone], Small intestinal endoscopy, enteroscopy                            beyond second portion of duodenum, not including                            ileum; with biopsy, single or multiple Diagnosis Code(s):        --- Professional ---                           K76.6, Portal hypertension                           K31.89, Other diseases of stomach and duodenum                           R11.2, Nausea with vomiting, unspecified  R93.3, Abnormal findings on diagnostic imaging of                            other parts of digestive tract CPT copyright 2019 [employer]. All rights reserved. The codes documented in this report are preliminary and upon coder review may  be revised to meet current compliance requirements. Justice Britain, MD 04/19/2022 2:06:57 PM Number of Addenda: 0

## 2022-04-20 LAB — CBC
HCT: 21.3 % — ABNORMAL LOW (ref 39.0–52.0)
Hemoglobin: 7.2 g/dL — ABNORMAL LOW (ref 13.0–17.0)
MCH: 34.6 pg — ABNORMAL HIGH (ref 26.0–34.0)
MCHC: 33.8 g/dL (ref 30.0–36.0)
MCV: 102.4 fL — ABNORMAL HIGH (ref 80.0–100.0)
Platelets: 110 10*3/uL — ABNORMAL LOW (ref 150–400)
RBC: 2.08 MIL/uL — ABNORMAL LOW (ref 4.22–5.81)
RDW: 13.2 % (ref 11.5–15.5)
WBC: 2.2 10*3/uL — ABNORMAL LOW (ref 4.0–10.5)
nRBC: 0 % (ref 0.0–0.2)

## 2022-04-20 LAB — COMPREHENSIVE METABOLIC PANEL
ALT: 17 U/L (ref 0–44)
AST: 24 U/L (ref 15–41)
Albumin: 2.8 g/dL — ABNORMAL LOW (ref 3.5–5.0)
Alkaline Phosphatase: 37 U/L — ABNORMAL LOW (ref 38–126)
Anion gap: 6 (ref 5–15)
BUN: <5 mg/dL — ABNORMAL LOW (ref 6–20)
CO2: 26 mmol/L (ref 22–32)
Calcium: 8.2 mg/dL — ABNORMAL LOW (ref 8.9–10.3)
Chloride: 101 mmol/L (ref 98–111)
Creatinine, Ser: 0.63 mg/dL (ref 0.61–1.24)
GFR, Estimated: >60 mL/min (ref >60)
Glucose, Bld: 92 mg/dL (ref 70–99)
Potassium: 3.6 mmol/L (ref 3.5–5.1)
Sodium: 133 mmol/L — ABNORMAL LOW (ref 135–145)
Total Bilirubin: 1.1 mg/dL (ref 0.3–1.2)
Total Protein: 5.4 g/dL — ABNORMAL LOW (ref 6.5–8.1)

## 2022-04-20 LAB — IRON AND TIBC
Iron: 30 ug/dL — ABNORMAL LOW (ref 45–182)
Saturation Ratios: 11 % — ABNORMAL LOW (ref 17.9–39.5)
TIBC: 265 ug/dL (ref 250–450)
UIBC: 235 ug/dL

## 2022-04-20 LAB — RETICULOCYTES
Immature Retic Fract: 33.3 % — ABNORMAL HIGH (ref 2.3–15.9)
RBC.: 2.13 MIL/uL — ABNORMAL LOW (ref 4.22–5.81)
Retic Count, Absolute: 78.6 10*3/uL (ref 19.0–186.0)
Retic Ct Pct: 3.7 % — ABNORMAL HIGH (ref 0.4–3.1)

## 2022-04-20 LAB — PHOSPHORUS: Phosphorus: 3.4 mg/dL (ref 2.5–4.6)

## 2022-04-20 LAB — FERRITIN: Ferritin: 114 ng/mL (ref 24–336)

## 2022-04-20 LAB — FOLATE: Folate: 19.9 ng/mL (ref >5.9)

## 2022-04-20 LAB — VITAMIN B12: Vitamin B-12: 277 pg/mL (ref 180–914)

## 2022-04-20 LAB — MAGNESIUM: Magnesium: 2 mg/dL (ref 1.7–2.4)

## 2022-04-20 MED ORDER — THIAMINE HCL 100 MG/ML IJ SOLN: 100.0000 mg | Freq: Every day | INTRAMUSCULAR | Status: DC

## 2022-04-20 MED ORDER — ENSURE ENLIVE PO LIQD
237.0000 mL | Freq: Three times a day (TID) | ORAL | Status: DC
  Administered 2022-04-20 – 2022-04-23 (×10): 237 mL via ORAL

## 2022-04-20 MED ORDER — LORAZEPAM 2 MG/ML IJ SOLN: 0.0000 mg | Freq: Two times a day (BID) | INTRAMUSCULAR | Status: DC

## 2022-04-20 MED ORDER — POTASSIUM CHLORIDE CRYS ER 20 MEQ PO TBCR
40.0000 meq | EXTENDED_RELEASE_TABLET | Freq: Once | ORAL | Status: AC
  Administered 2022-04-20: 40 meq via ORAL

## 2022-04-20 MED ORDER — LORAZEPAM 1 MG PO TABS: 0.0000 mg | ORAL_TABLET | Freq: Two times a day (BID) | ORAL | Status: DC

## 2022-04-20 MED ORDER — LORAZEPAM 1 MG PO TABS
1.0000 mg | ORAL_TABLET | ORAL | Status: AC | PRN
  Administered 2022-04-21: 2 mg via ORAL

## 2022-04-20 MED ORDER — THIAMINE HCL 100 MG PO TABS
100.0000 mg | ORAL_TABLET | Freq: Every day | ORAL | Status: DC
  Administered 2022-04-20 – 2022-04-23 (×4): 100 mg via ORAL

## 2022-04-20 MED ORDER — LORAZEPAM 2 MG/ML IJ SOLN
1.0000 mg | INTRAMUSCULAR | Status: AC | PRN
  Administered 2022-04-20 (×4): 2 mg via INTRAVENOUS
  Administered 2022-04-20 (×2): 3 mg via INTRAVENOUS
  Administered 2022-04-20 – 2022-04-21 (×4): 2 mg via INTRAVENOUS
  Administered 2022-04-21 (×3): 4 mg via INTRAVENOUS
  Administered 2022-04-21 – 2022-04-22 (×5): 2 mg via INTRAVENOUS
  Administered 2022-04-22: 4 mg via INTRAVENOUS
  Administered 2022-04-22: 2 mg via INTRAVENOUS
  Administered 2022-04-22: 4 mg via INTRAVENOUS
  Administered 2022-04-22: 2 mg via INTRAVENOUS

## 2022-04-20 MED ORDER — ADULT MULTIVITAMIN W/MINERALS CH
1.0000 | ORAL_TABLET | Freq: Every day | ORAL | Status: DC
  Administered 2022-04-20 – 2022-04-23 (×4): 1 via ORAL

## 2022-04-20 MED ORDER — FOLIC ACID 1 MG PO TABS
1.0000 mg | ORAL_TABLET | Freq: Every day | ORAL | Status: DC
  Administered 2022-04-20 – 2022-04-23 (×4): 1 mg via ORAL
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
## 2022-04-20 NOTE — Progress Notes (Addendum)
Findings noted per EGD by GI. Appreciate their assistance.  Will await biopsy results prior to any further recommendations.  If these are nondiagnostic would likely ask IR to biopsy prior to surgical biopsy.  In the interim could do full liquids and protein shakes to help with nutrition.  Will follow.  Letha Cape 9:14 AM 04/20/2022
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
## 2022-04-21 LAB — COMPREHENSIVE METABOLIC PANEL
ALT: 20 U/L (ref 0–44)
AST: 37 U/L (ref 15–41)
Albumin: 3 g/dL — ABNORMAL LOW (ref 3.5–5.0)
Alkaline Phosphatase: 41 U/L (ref 38–126)
Anion gap: 5 (ref 5–15)
BUN: <5 mg/dL — ABNORMAL LOW (ref 6–20)
CO2: 28 mmol/L (ref 22–32)
Calcium: 9 mg/dL (ref 8.9–10.3)
Chloride: 100 mmol/L (ref 98–111)
Creatinine, Ser: 0.6 mg/dL — ABNORMAL LOW (ref 0.61–1.24)
GFR, Estimated: >60 mL/min (ref >60)
Glucose, Bld: 108 mg/dL — ABNORMAL HIGH (ref 70–99)
Potassium: 3.6 mmol/L (ref 3.5–5.1)
Sodium: 133 mmol/L — ABNORMAL LOW (ref 135–145)
Total Bilirubin: 1.3 mg/dL — ABNORMAL HIGH (ref 0.3–1.2)
Total Protein: 5.7 g/dL — ABNORMAL LOW (ref 6.5–8.1)

## 2022-04-21 LAB — CBC
HCT: 23.5 % — ABNORMAL LOW (ref 39.0–52.0)
Hemoglobin: 8 g/dL — ABNORMAL LOW (ref 13.0–17.0)
MCH: 34.6 pg — ABNORMAL HIGH (ref 26.0–34.0)
MCHC: 34 g/dL (ref 30.0–36.0)
MCV: 101.7 fL — ABNORMAL HIGH (ref 80.0–100.0)
Platelets: 171 10*3/uL (ref 150–400)
RBC: 2.31 MIL/uL — ABNORMAL LOW (ref 4.22–5.81)
RDW: 13.6 % (ref 11.5–15.5)
WBC: 3 10*3/uL — ABNORMAL LOW (ref 4.0–10.5)
nRBC: 0 % (ref 0.0–0.2)

## 2022-04-21 LAB — MAGNESIUM: Magnesium: 2 mg/dL (ref 1.7–2.4)

## 2022-04-21 LAB — GLUCOSE, CAPILLARY: Glucose-Capillary: 122 mg/dL — ABNORMAL HIGH (ref 70–99)

## 2022-04-21 LAB — PHOSPHORUS: Phosphorus: 3.6 mg/dL (ref 2.5–4.6)

## 2022-04-21 LAB — SURGICAL PATHOLOGY

## 2022-04-21 MED ORDER — CYANOCOBALAMIN 1000 MCG/ML IJ SOLN
1000.0000 ug | Freq: Every day | INTRAMUSCULAR | Status: DC
  Administered 2022-04-21 – 2022-04-23 (×3): 1000 ug via SUBCUTANEOUS

## 2022-04-21 MED ORDER — POTASSIUM CHLORIDE CRYS ER 20 MEQ PO TBCR
40.0000 meq | EXTENDED_RELEASE_TABLET | Freq: Once | ORAL | Status: AC
  Administered 2022-04-21: 40 meq via ORAL

## 2022-04-21 NOTE — Progress Notes (Signed)
   Pathology from EGD was not helpful - needs IR biopsy.  Call us back if other ?'s  Iva Boop, MD, [facility] See Loretha Stapler on call - gastroenterology for best contact person 04/21/2022 5:44 PM   FINAL MICROSCOPIC DIAGNOSIS:   A. DUODENUM, BIOPSY:  - Duodenal mucosa with no specific histopathologic changes  - Negative for increased intraepithelial lymphocytes or villous  architectural changes   B. STOMACH, BIOPSY:  - Gastric oxyntic mucosa showing marked nonspecific reactive gastropathy  with erosion  - Helicobacter pylori-like organisms are not identified on routine HE  stain

## 2022-04-21 NOTE — Progress Notes (Signed)
Continuing to follow and awaiting pathology results.  Gary [name] 9:27 AM 04/21/2022

## 2022-04-21 NOTE — Progress Notes (Signed)
   Waiting on pathology results - will weigh in again when they are back.  Gatha Mayer, MD, [facility] See Shea Evans on call - gastroenterology for best contact person 04/21/2022 11:33 AM
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
## 2022-04-21 NOTE — Plan of Care (Signed)
  Problem: Education: Goal: Knowledge of General Education information will improve Description: Including pain rating scale, medication(s)/side effects and non-pharmacologic comfort measures Outcome: Progressing   Problem: Pain Managment: Goal: General experience of comfort will improve Outcome: Progressing   Problem: Skin Integrity: Goal: Risk for impaired skin integrity will decrease Outcome: Progressing   

## 2022-04-22 ENCOUNTER — Encounter: Payer: Self-pay | Admitting: Gastroenterology

## 2022-04-22 ENCOUNTER — Encounter (HOSPITAL_COMMUNITY): Payer: Self-pay | Admitting: Gastroenterology

## 2022-04-22 LAB — LIPASE, BLOOD: Lipase: 307 U/L — ABNORMAL HIGH (ref 11–51)

## 2022-04-22 MED ORDER — BICTEGRAVIR-EMTRICITAB-TENOFOV 50-200-25 MG PO TABS
1.0000 | ORAL_TABLET | Freq: Every day | ORAL | Status: DC
  Administered 2022-04-23: 1 via ORAL
  Filled 2022-04-22: qty 1

## 2022-04-22 MED ORDER — HYDROMORPHONE HCL 1 MG/ML IJ SOLN
0.5000 mg | INTRAMUSCULAR | Status: DC | PRN
  Administered 2022-04-22 – 2022-04-23 (×4): 0.5 mg via INTRAVENOUS

## 2022-04-22 MED ORDER — BISACODYL 10 MG RE SUPP: 10.0000 mg | Freq: Every day | RECTAL | Status: DC | PRN

## 2022-04-22 MED ORDER — SENNOSIDES-DOCUSATE SODIUM 8.6-50 MG PO TABS: 2.0000 | ORAL_TABLET | Freq: Every evening | ORAL | Status: DC | PRN

## 2022-04-22 MED ORDER — OXYCODONE HCL 5 MG PO TABS
5.0000 mg | ORAL_TABLET | Freq: Four times a day (QID) | ORAL | Status: DC | PRN
  Administered 2022-04-22 – 2022-04-23 (×3): 5 mg via ORAL

## 2022-04-22 MED ORDER — POLYETHYLENE GLYCOL 3350 17 G PO PACK
17.0000 g | PACK | Freq: Every day | ORAL | Status: DC
  Administered 2022-04-22 – 2022-04-23 (×2): 17 g via ORAL

## 2022-04-22 NOTE — Progress Notes (Signed)
   Request made for IR Bx for this pt.  Hx HIV; etoh abuse- pancreatitis  CT 6/8: IMPRESSION: 1. The third portion of the duodenum and ascending colon are markedly abnormal. There appears to be high-grade mass-like wall thickening of the posterior greater than anterior aspects of the third portion of the duodenum. There is ascites greatest within the right lower quadrant, surrounding the ascending colon which is apparently decompressed with more mild to moderate thick wall. The more distal colon is decompressed which may be the cause for apparent wall thickening in these regions. No definite bowel perforation is seen, although note is made that the ascending colon is difficult to visualize, and on axial images 50 through 58 there is air that is favored to be within narrow distal ascending colon lumen rather than extraluminal air. In addition to small bowel masses such as lymphoma, inflammatory bowel disease is considered but felt less likely. Note is made that these regions of bowel were normal in appearance on 07/11/2021 prior CT. 2. Moderate fatty infiltration of the liver.  EGD with Dr Leone Payor FINAL MICROSCOPIC DIAGNOSIS:  A. DUODENUM, BIOPSY:  - Duodenal mucosa with no specific histopathologic changes  - Negative for increased intraepithelial lymphocytes or villous  architectural changes  B. STOMACH, BIOPSY:  - Gastric oxyntic mucosa showing marked nonspecific reactive gastropathy  with erosion  - Helicobacter pylori-like organisms are not identified on routine HE  stain    Requesting tissue diagnosis with biopsy with IR  Dr Fredia Sorrow reviewed imaging He does not see any site for biopsy at this point Rec: re CT in few weeks- to see if any site may become identifiable  Dr Leone Payor and Dr Jerral Ralph aware

## 2022-04-22 NOTE — Progress Notes (Signed)
GI biopsies were nondiagnostic.  IR unable to biopsy anything from initial CT scan.  Discussions ongoing about best approach to take regarding how to biopsy something to get a diagnosis.    Letha Cape 9:22 AM 04/22/2022

## 2022-04-22 NOTE — TOC Progression Note (Signed)
Transition of Care [facility]) - Progression Note    Patient Details  Name: Gary [name] MRN: [id number] Date of Birth: 03-27-1986  Transition of Care [facility]) CM/SW Contact  Lawerance Sabal, RN Phone Number: 04/22/2022, 11:07 AM  Clinical Narrative:    Attempted to assess patient. He easily became agitated (not aggressive) and was difficult to redirect into conversation.     Expected Discharge Plan: Homeless Shelter Barriers to Discharge: Continued Medical Work up, Inadequate or no insurance  Expected Discharge Plan and Services Expected Discharge Plan: Homeless Shelter   Discharge Planning Services: CM Consult   Living arrangements for the past 2 months: Homeless                                       Social Determinants of Health (SDOH) Interventions    Readmission Risk Interventions     No data to display
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
## 2022-04-22 NOTE — Progress Notes (Signed)
ID PROGRESS NOTE  Appears to have better intake of oral foods  Small bowel biopsy non-specific and would need IR biopsy   A/P: HIV disease with poor adherence. Admitted for ETOH related pancreatitis, and found to have duodenal mass - since improving from pancreatitis, can restart biktarvy - does not need oi proph since CD 4 count is not below 200  Gary [name] B. [place] [facility] [phone]

## 2022-04-23 ENCOUNTER — Inpatient Hospital Stay (HOSPITAL_COMMUNITY): Payer: Self-pay

## 2022-04-23 ENCOUNTER — Other Ambulatory Visit (HOSPITAL_COMMUNITY): Payer: Self-pay

## 2022-04-23 DIAGNOSIS — E8729 Other acidosis: Secondary | ICD-10-CM

## 2022-04-23 LAB — COMPREHENSIVE METABOLIC PANEL
ALT: 20 U/L (ref 0–44)
AST: 25 U/L (ref 15–41)
Albumin: 3 g/dL — ABNORMAL LOW (ref 3.5–5.0)
Alkaline Phosphatase: 42 U/L (ref 38–126)
Anion gap: 9 (ref 5–15)
BUN: 5 mg/dL — ABNORMAL LOW (ref 6–20)
CO2: 24 mmol/L (ref 22–32)
Calcium: 8.5 mg/dL — ABNORMAL LOW (ref 8.9–10.3)
Chloride: 98 mmol/L (ref 98–111)
Creatinine, Ser: 0.66 mg/dL (ref 0.61–1.24)
GFR, Estimated: >60 mL/min (ref >60)
Glucose, Bld: 95 mg/dL (ref 70–99)
Potassium: 3.8 mmol/L (ref 3.5–5.1)
Sodium: 131 mmol/L — ABNORMAL LOW (ref 135–145)
Total Bilirubin: 1 mg/dL (ref 0.3–1.2)
Total Protein: 6 g/dL — ABNORMAL LOW (ref 6.5–8.1)

## 2022-04-23 LAB — CBC
HCT: 25.7 % — ABNORMAL LOW (ref 39.0–52.0)
Hemoglobin: 8.4 g/dL — ABNORMAL LOW (ref 13.0–17.0)
MCH: 33.5 pg (ref 26.0–34.0)
MCHC: 32.7 g/dL (ref 30.0–36.0)
MCV: 102.4 fL — ABNORMAL HIGH (ref 80.0–100.0)
Platelets: 253 10*3/uL (ref 150–400)
RBC: 2.51 MIL/uL — ABNORMAL LOW (ref 4.22–5.81)
RDW: 13.4 % (ref 11.5–15.5)
WBC: 3.7 10*3/uL — ABNORMAL LOW (ref 4.0–10.5)
nRBC: 0 % (ref 0.0–0.2)

## 2022-04-23 LAB — MAGNESIUM: Magnesium: 1.7 mg/dL (ref 1.7–2.4)

## 2022-04-23 LAB — HIV-1 RNA QUANT-NO REFLEX-BLD
HIV 1 RNA Quant: 80 copies/mL
LOG10 HIV-1 RNA: 1.903 log10copy/mL

## 2022-04-23 IMAGING — CT CT ABD-PELV W/ CM
2 of 4 series · 15 of 46 positions shown, 17 images · IV contrast (agent unspecified)
Comparison: [DATE]

CLINICAL DATA: Follow-up pancreatitis.

EXAM:
CT ABDOMEN AND PELVIS WITH CONTRAST
TECHNIQUE: Multidetector CT imaging of the abdomen and pelvis was performed
using the standard protocol following bolus administration of
intravenous contrast.

[Series 3: a/p w/ 5mm · axial · 0.77mm/px · z∈[+996,+1430]mm · 12 of 99 slices shown, 14 images]
[im 8/99  soft-tissue]
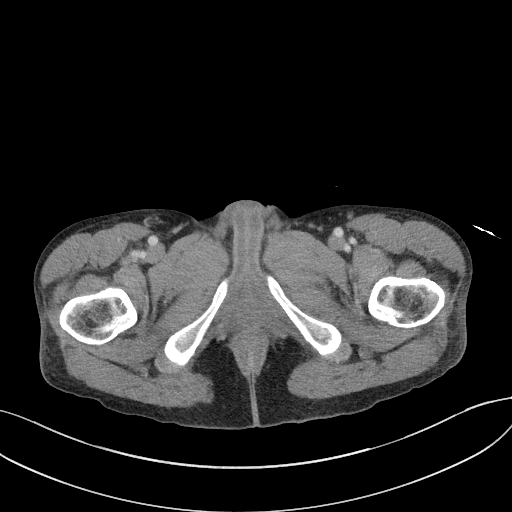
[im 8/99  bone]
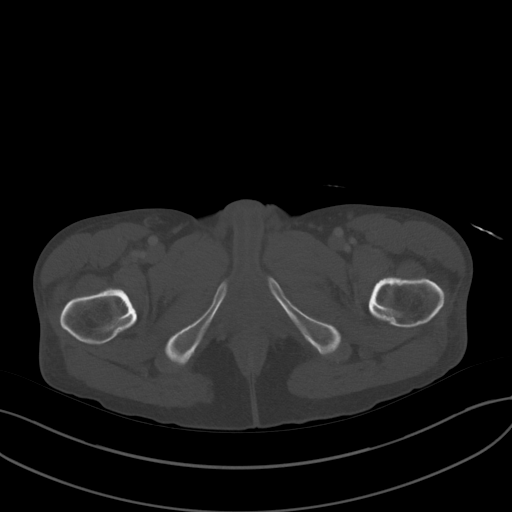
[im 16/99  soft-tissue]
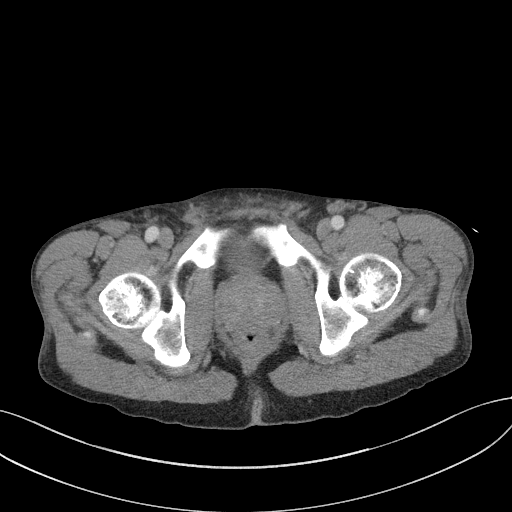
[im 24/99  soft-tissue]
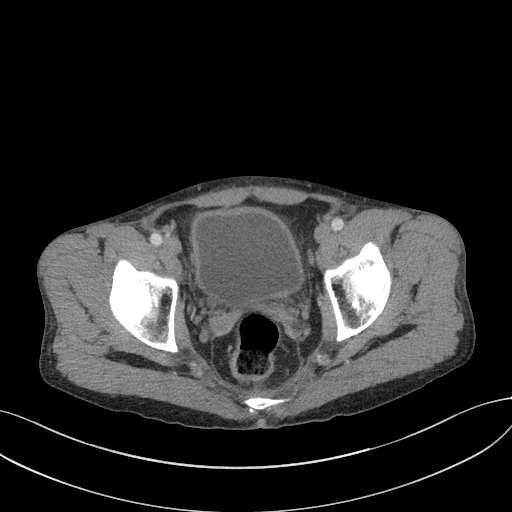
[im 32/99  soft-tissue]
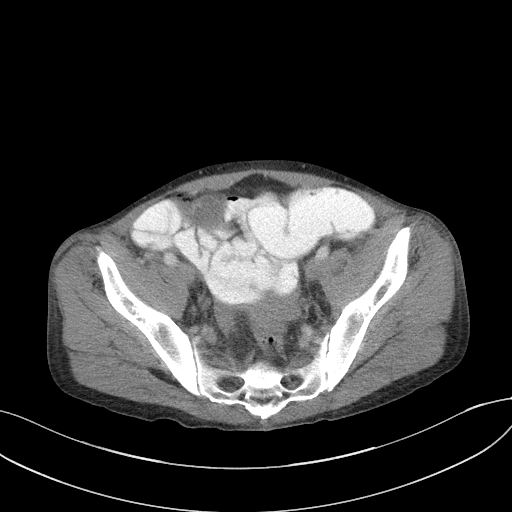
[im 40/99  soft-tissue]
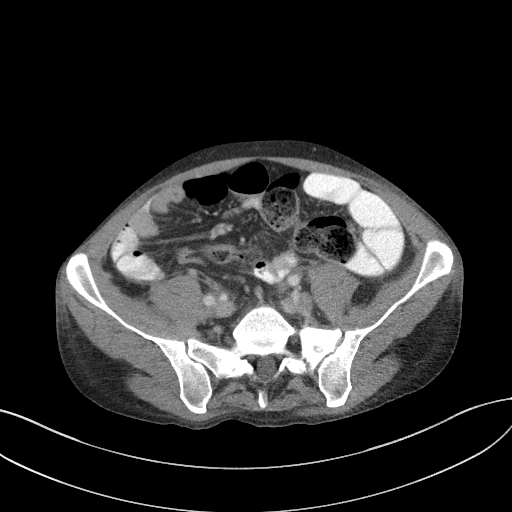
[im 48/99  soft-tissue]
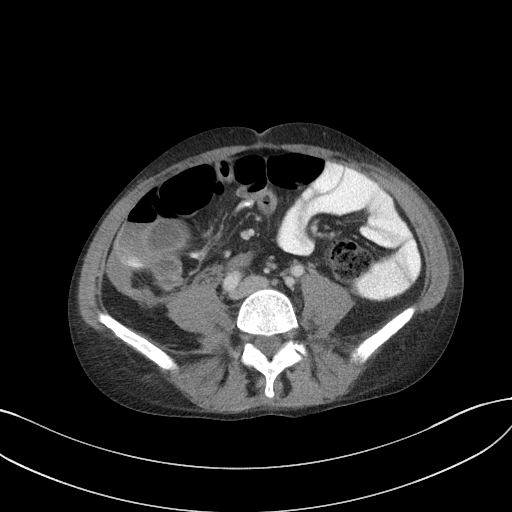
[im 55/99  soft-tissue]
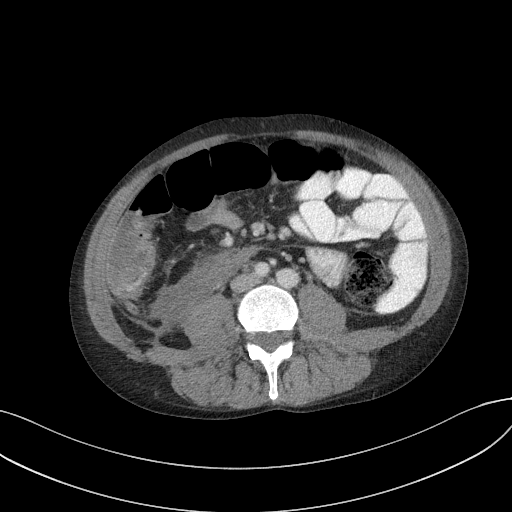
[im 63/99  soft-tissue]
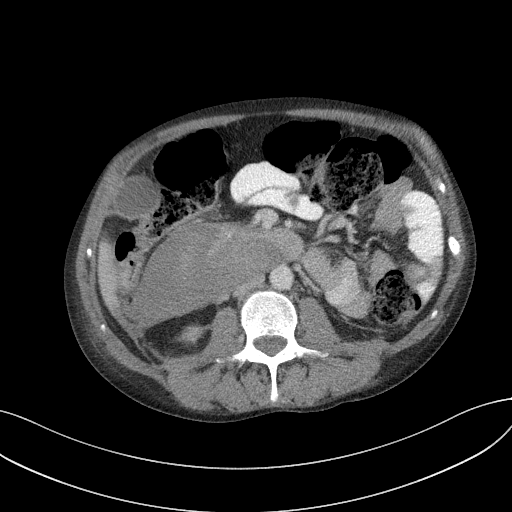
[im 71/99  soft-tissue]
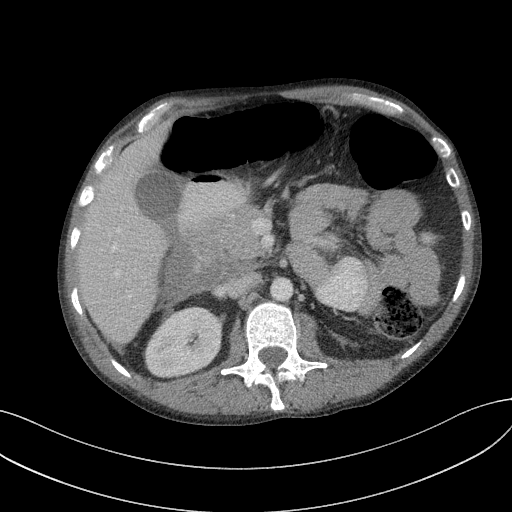
[im 71/99  bone]
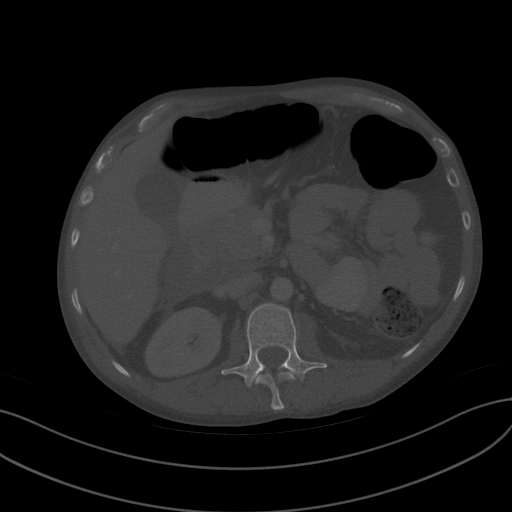
[im 79/99  soft-tissue]
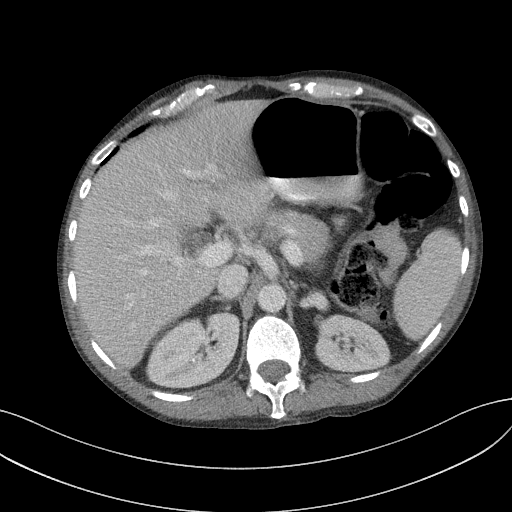
[im 87/99  soft-tissue]
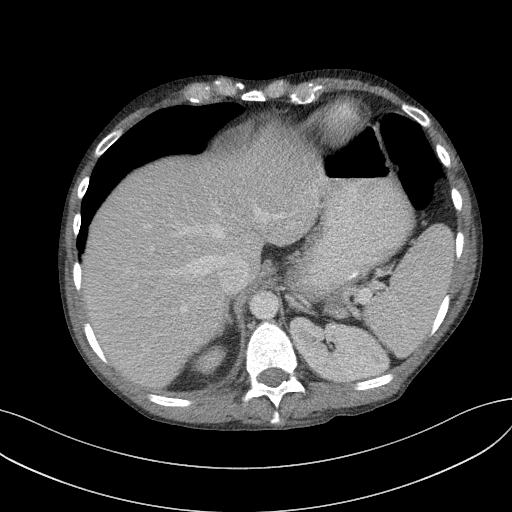
[im 95/99  soft-tissue]
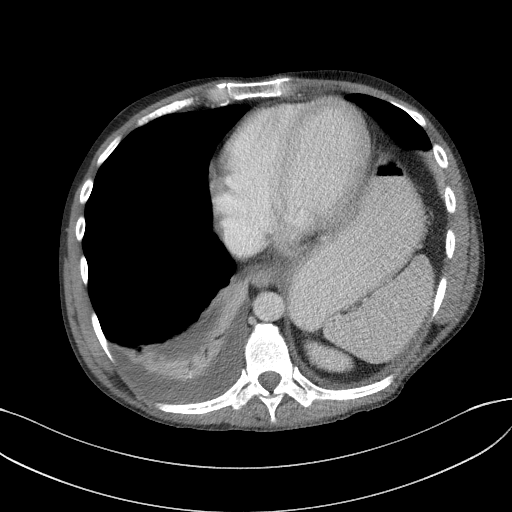

[Series 6: a/p w/ cor · coronal · 0.73mm/px · 3 of 140 slices shown]
[im 47/140  soft-tissue]
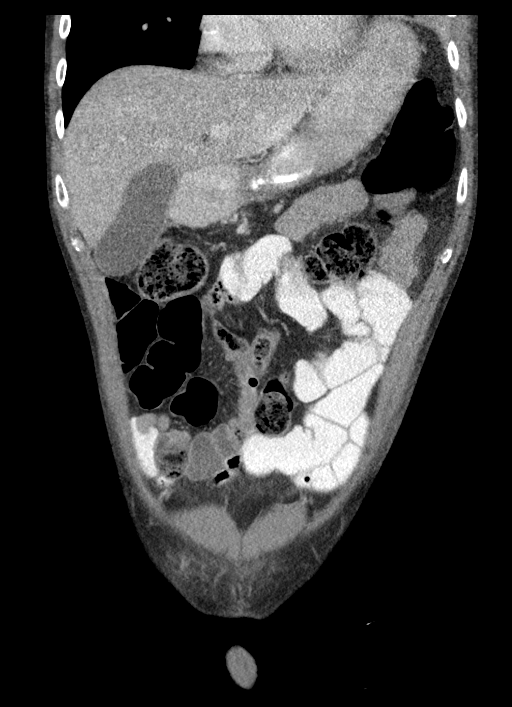
[im 62/140  soft-tissue]
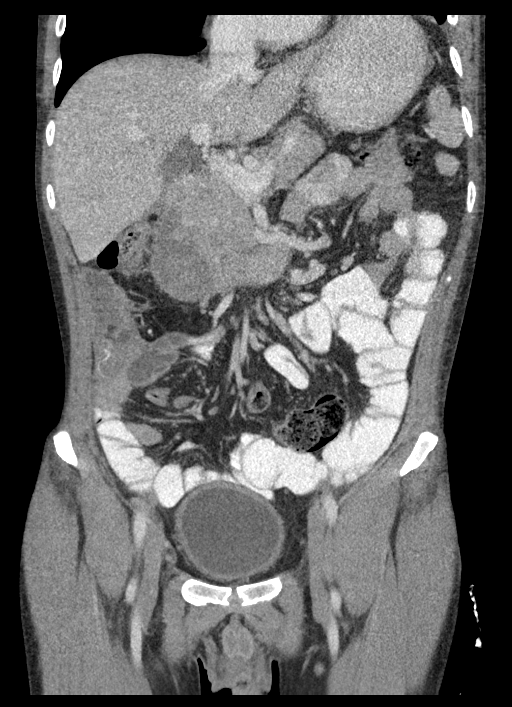
[im 78/140  soft-tissue]
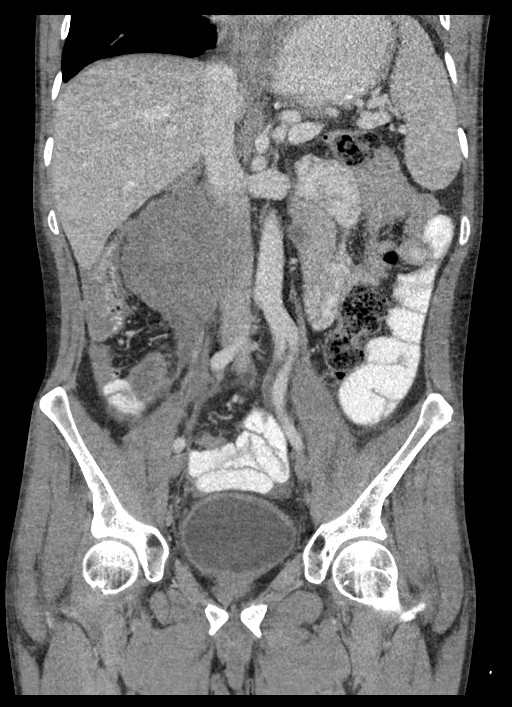

[15 of 46 positions shown; findings below may reference images not displayed]

RADIATION DOSE REDUCTION: This exam was performed according to the
departmental dose-optimization program which includes automated
exposure control, adjustment of the mA and/or kV according to
patient size and/or use of iterative reconstruction technique.

CONTRAST:  80mL OMNIPAQUE IOHEXOL 350 MG/ML SOLN
FINDINGS: Lower chest: New bilateral pleural effusions and bibasilar
atelectasis.

Hepatobiliary: No hepatic lesions or intrahepatic biliary
dilatation. The gallbladder is unremarkable. No common bile duct
dilatation.

Pancreas: Much improved CT appearance of the pancreas. There is
persistent mild inflammation and enlargement of the pancreatic head
but it is much less significant. There is normal pancreatic
enhancement without evidence of pancreatic necrosis. The large
complex peripancreatic fluid collection is smaller and now mainly
localized to the right anterior pararenal space. Some areas of
residual high attenuation suggest some component of blood.

Spleen: Normal size. No focal lesions.

Adrenals/Urinary Tract: Adrenal glands and kidneys are unremarkable
and stable. The bladder is unremarkable.

Stomach/Bowel: The stomach demonstrates mild distension. There is
persistent moderate inflammation involving the second and third
portions of the duodenum but no obstruction. The small bowel and
colon are unremarkable. The terminal ileum and appendix are normal.

Vascular/Lymphatic: The aorta and branch vessels are patent. The
major venous structures are patent. The splenic and portal veins are
patent. Scattered lymph nodes are stable and likely reactive.

Reproductive: The prostate gland and seminal vesicles are
unremarkable.

Other: Mixed attenuation fluid noted in the pelvis has increased but
fluid elsewhere has significantly decreased.

Musculoskeletal: No significant bony findings.
IMPRESSION: 1. Much improved CT appearance of the pancreas. There is persistent
mild inflammation and enlargement of the pancreatic head but it is
much less significant.
2. The large complex peripancreatic fluid collection is much smaller
and now mainly localized to the right anterior pararenal space. Some
areas of residual high attenuation suggest some component of blood.
3. Persistent moderate inflammation involving the second and third
portions of the duodenum but no obstruction.
4. New bilateral pleural effusions and bibasilar atelectasis.

## 2022-04-23 MED ORDER — BICTEGRAVIR-EMTRICITAB-TENOFOV 50-200-25 MG PO TABS
1.0000 | ORAL_TABLET | Freq: Every day | ORAL | 3 refills | Status: DC
  Filled 2022-04-23: qty 30, 30d supply, fill #0

## 2022-04-23 MED ORDER — LEVETIRACETAM 500 MG PO TABS: 500.0000 mg | ORAL_TABLET | Freq: Two times a day (BID) | ORAL | Status: DC

## 2022-04-23 MED ORDER — LEVETIRACETAM 500 MG PO TABS
500.0000 mg | ORAL_TABLET | Freq: Two times a day (BID) | ORAL | 0 refills | Status: DC
  Filled 2022-04-23: qty 60, 30d supply, fill #0

## 2022-04-23 MED ORDER — THIAMINE HCL 100 MG PO TABS
100.0000 mg | ORAL_TABLET | Freq: Every day | ORAL | 0 refills | Status: DC
  Filled 2022-04-23: qty 30, 30d supply, fill #0

## 2022-04-23 MED ORDER — GABAPENTIN 300 MG PO CAPS
300.0000 mg | ORAL_CAPSULE | Freq: Three times a day (TID) | ORAL | 1 refills | Status: DC
  Filled 2022-04-23: qty 90, 30d supply, fill #0

## 2022-04-23 MED ORDER — PANTOPRAZOLE SODIUM 40 MG PO TBEC
40.0000 mg | DELAYED_RELEASE_TABLET | Freq: Two times a day (BID) | ORAL | 2 refills | Status: DC
  Filled 2022-04-23: qty 60, 30d supply, fill #0

## 2022-04-23 MED ORDER — FOLIC ACID 1 MG PO TABS
1.0000 mg | ORAL_TABLET | Freq: Every day | ORAL | 3 refills | Status: DC
  Filled 2022-04-23: qty 30, 30d supply, fill #0

## 2022-04-23 MED ORDER — IOHEXOL 9 MG/ML PO SOLN
ORAL | Status: AC
  Filled 2022-04-23: qty 1000

## 2022-04-23 MED ORDER — IOHEXOL 350 MG/ML SOLN
100.0000 mL | Freq: Once | INTRAVENOUS | Status: AC | PRN
  Administered 2022-04-23: 80 mL via INTRAVENOUS

## 2022-04-23 MED ORDER — PANTOPRAZOLE SODIUM 40 MG PO TBEC: 40.0000 mg | DELAYED_RELEASE_TABLET | Freq: Two times a day (BID) | ORAL | Status: DC
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
## 2022-04-23 NOTE — Discharge Summary (Signed)
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 input(s): "DDIMER" in the last 72 hours. No results for input(s): "HGBA1C" in the last 72 hours. No results for input(s): "CHOL", "HDL", "LDLCALC", "TRIG", "CHOLHDL", "LDLDIRECT" in the last 72 hours. No results for input(s): "TSH", "T4TOTAL", "T3FREE", "THYROIDAB" in the last 72 hours.  Invalid input(s): "FREET3" No results for input(s): "VITAMINB12", "FOLATE", "FERRITIN", "TIBC", "IRON", "RETICCTPCT" in the last 72 hours. Coags: No results for input(s): "INR" in the last 72 hours.  Invalid input(s): "PT" Microbiology: No results found for this or any previous visit (from the past 240 hour(s)).  FURTHER DISCHARGE INSTRUCTIONS:  Get Medicines reviewed and adjusted: Please take all your medications with you for your next visit with your Primary MD  Laboratory/radiological data: Please request your Primary MD to go over all hospital tests and procedure/radiological results at the follow up, please ask your Primary MD to get all Hospital records sent to his/her  office.  In some cases, they will be blood work, cultures and biopsy results pending at the time of your discharge. Please request that your primary care M.D. goes through all the records of your hospital data and follows up on these results.  Also Note the following: If you experience worsening of your admission symptoms, develop shortness of breath, life threatening emergency, suicidal or homicidal thoughts you must seek medical attention immediately by calling 911 or calling your MD immediately  if symptoms less severe.  You must read complete instructions/literature along with all the possible adverse reactions/side effects for all the Medicines you take and that have been prescribed to you. Take any new Medicines after you have completely understood and accpet all the possible adverse reactions/side effects.   Do not drive when taking Pain medications or sleeping medications (Benzodaizepines)  Do not take more than prescribed Pain, Sleep and Anxiety Medications. It is not advisable to combine anxiety,sleep and pain medications without talking with your primary care practitioner  Special Instructions: If you have smoked or chewed Tobacco  in the last 2 yrs please stop smoking, stop any regular Alcohol  and or any Recreational drug use.  Wear Seat belts while driving.  Please  note: You were cared for by a hospitalist during your hospital stay. Once you are discharged, your primary care physician will handle any further medical issues. Please note that NO REFILLS for any discharge medications will be authorized once you are discharged, as it is imperative that you return to your primary care physician (or establish a relationship with a primary care physician if you do not have one) for your post hospital discharge needs so that they can reassess your need for medications and monitor your lab values.  Total Time spent coordinating discharge including counseling, education and face to face time  equals greater than 30 minutes.  Signed: Tenesha Garza 04/23/2022 1:40 PM

## 2022-04-23 NOTE — TOC Transition Note (Signed)
Transition of Care [facility]) - CM/SW Discharge Note   Patient Details  Name: Gary [name] MRN: [id number] Date of Birth: 1986/10/25  [facility] CM/SW Contact:  Leone Haven, RN Phone Number: 04/23/2022, 2:20 PM   Clinical Narrative:    Patient left AMA.      Barriers to Discharge: Continued Medical Work up, Inadequate or no insurance   Patient Goals and CMS Choice        Discharge Placement                       Discharge Plan and Services   Discharge Planning Services: CM Consult                                 Social Determinants of Health (SDOH) Interventions     Readmission Risk Interventions     No data to display

## 2022-04-23 NOTE — Progress Notes (Signed)
                                                         AMA  Patient at this time expresses desire to leave the Hospital immidiately, patient has been warned that this is not Medically advisable at this time, and can result in Medical complications like Death and Disability, patient understands and accepts the risks involved and assumes full responsibilty of this decision.   Jeoffrey Massed M.D on 04/23/2022 at 1:34 PM

## 2022-04-25 ENCOUNTER — Emergency Department (HOSPITAL_COMMUNITY): Payer: Self-pay

## 2022-04-25 ENCOUNTER — Inpatient Hospital Stay (HOSPITAL_COMMUNITY)
Admission: EM | Admit: 2022-04-25 | Discharge: 2022-05-03 | DRG: 438 | Disposition: A | Discharge status: Home / Self Care | Payer: Self-pay | Attending: Internal Medicine | Admitting: Internal Medicine

## 2022-04-25 DIAGNOSIS — E872 Acidosis, unspecified: Secondary | ICD-10-CM | POA: Diagnosis present

## 2022-04-25 DIAGNOSIS — Z79899 Other long term (current) drug therapy: Secondary | ICD-10-CM

## 2022-04-25 DIAGNOSIS — R64 Cachexia: Secondary | ICD-10-CM | POA: Diagnosis present

## 2022-04-25 DIAGNOSIS — F10930 Alcohol use, unspecified with withdrawal, uncomplicated: Secondary | ICD-10-CM

## 2022-04-25 DIAGNOSIS — Z6821 Body mass index (BMI) 21.0-21.9, adult: Secondary | ICD-10-CM

## 2022-04-25 DIAGNOSIS — E861 Hypovolemia: Secondary | ICD-10-CM | POA: Diagnosis present

## 2022-04-25 DIAGNOSIS — E871 Hypo-osmolality and hyponatremia: Secondary | ICD-10-CM | POA: Diagnosis present

## 2022-04-25 DIAGNOSIS — F17210 Nicotine dependence, cigarettes, uncomplicated: Secondary | ICD-10-CM | POA: Diagnosis present

## 2022-04-25 DIAGNOSIS — K852 Alcohol induced acute pancreatitis without necrosis or infection: Principal | ICD-10-CM

## 2022-04-25 DIAGNOSIS — R9431 Abnormal electrocardiogram [ECG] [EKG]: Secondary | ICD-10-CM | POA: Diagnosis present

## 2022-04-25 DIAGNOSIS — E860 Dehydration: Secondary | ICD-10-CM | POA: Diagnosis present

## 2022-04-25 DIAGNOSIS — D638 Anemia in other chronic diseases classified elsewhere: Secondary | ICD-10-CM | POA: Diagnosis present

## 2022-04-25 DIAGNOSIS — Z5900 Homelessness unspecified: Secondary | ICD-10-CM

## 2022-04-25 DIAGNOSIS — G40909 Epilepsy, unspecified, not intractable, without status epilepticus: Secondary | ICD-10-CM | POA: Diagnosis present

## 2022-04-25 DIAGNOSIS — F10139 Alcohol abuse with withdrawal, unspecified: Secondary | ICD-10-CM | POA: Diagnosis present

## 2022-04-25 DIAGNOSIS — K219 Gastro-esophageal reflux disease without esophagitis: Secondary | ICD-10-CM | POA: Diagnosis present

## 2022-04-25 DIAGNOSIS — K5900 Constipation, unspecified: Secondary | ICD-10-CM | POA: Diagnosis present

## 2022-04-25 DIAGNOSIS — Z811 Family history of alcohol abuse and dependence: Secondary | ICD-10-CM

## 2022-04-25 DIAGNOSIS — E43 Unspecified severe protein-calorie malnutrition: Secondary | ICD-10-CM | POA: Diagnosis present

## 2022-04-25 DIAGNOSIS — Z800 Family history of malignant neoplasm of digestive organs: Secondary | ICD-10-CM

## 2022-04-25 DIAGNOSIS — Z888 Allergy status to other drugs, medicaments and biological substances status: Secondary | ICD-10-CM

## 2022-04-25 DIAGNOSIS — Z91148 Patient's other noncompliance with medication regimen for other reason: Secondary | ICD-10-CM

## 2022-04-25 DIAGNOSIS — Z21 Asymptomatic human immunodeficiency virus [HIV] infection status: Secondary | ICD-10-CM | POA: Diagnosis present

## 2022-04-25 DIAGNOSIS — K760 Fatty (change of) liver, not elsewhere classified: Secondary | ICD-10-CM | POA: Diagnosis present

## 2022-04-25 DIAGNOSIS — E876 Hypokalemia: Secondary | ICD-10-CM | POA: Diagnosis present

## 2022-04-25 DIAGNOSIS — D75838 Other thrombocytosis: Secondary | ICD-10-CM | POA: Diagnosis present

## 2022-04-25 DIAGNOSIS — Y900 Blood alcohol level of less than 20 mg/100 ml: Secondary | ICD-10-CM | POA: Diagnosis present

## 2022-04-25 DIAGNOSIS — N179 Acute kidney failure, unspecified: Secondary | ICD-10-CM | POA: Diagnosis present

## 2022-04-25 DIAGNOSIS — K859 Acute pancreatitis without necrosis or infection, unspecified: Secondary | ICD-10-CM | POA: Diagnosis present

## 2022-04-25 LAB — I-STAT VENOUS BLOOD GAS, ED
Acid-Base Excess: 10 mmol/L — ABNORMAL HIGH (ref 0.0–2.0)
Bicarbonate: 33.9 mmol/L — ABNORMAL HIGH (ref 20.0–28.0)
Calcium, Ion: 1 mmol/L — ABNORMAL LOW (ref 1.15–1.40)
HCT: 37 % — ABNORMAL LOW (ref 39.0–52.0)
Hemoglobin: 12.6 g/dL — ABNORMAL LOW (ref 13.0–17.0)
O2 Saturation: 81 %
Potassium: 3 mmol/L — ABNORMAL LOW (ref 3.5–5.1)
Sodium: 134 mmol/L — ABNORMAL LOW (ref 135–145)
TCO2: 35 mmol/L — ABNORMAL HIGH (ref 22–32)
pCO2, Ven: 43.5 mmHg — ABNORMAL LOW (ref 44–60)
pH, Ven: 7.499 — ABNORMAL HIGH (ref 7.25–7.43)
pO2, Ven: 41 mmHg (ref 32–45)

## 2022-04-25 LAB — BASIC METABOLIC PANEL
Anion gap: 30 — ABNORMAL HIGH (ref 5–15)
BUN: 39 mg/dL — ABNORMAL HIGH (ref 6–20)
CO2: 24 mmol/L (ref 22–32)
Calcium: 10.4 mg/dL — ABNORMAL HIGH (ref 8.9–10.3)
Chloride: 83 mmol/L — ABNORMAL LOW (ref 98–111)
Creatinine, Ser: 3.37 mg/dL — ABNORMAL HIGH (ref 0.61–1.24)
GFR, Estimated: 23 mL/min — ABNORMAL LOW (ref >60)
Glucose, Bld: 124 mg/dL — ABNORMAL HIGH (ref 70–99)
Potassium: 3.9 mmol/L (ref 3.5–5.1)
Sodium: 137 mmol/L (ref 135–145)

## 2022-04-25 LAB — HEPATIC FUNCTION PANEL
ALT: 24 U/L (ref 0–44)
AST: 43 U/L — ABNORMAL HIGH (ref 15–41)
Albumin: 5.3 g/dL — ABNORMAL HIGH (ref 3.5–5.0)
Alkaline Phosphatase: 68 U/L (ref 38–126)
Bilirubin, Direct: 0.6 mg/dL — ABNORMAL HIGH (ref 0.0–0.2)
Indirect Bilirubin: 0.9 mg/dL (ref 0.3–0.9)
Total Bilirubin: 1.5 mg/dL — ABNORMAL HIGH (ref 0.3–1.2)
Total Protein: 9.7 g/dL — ABNORMAL HIGH (ref 6.5–8.1)

## 2022-04-25 LAB — LACTIC ACID, PLASMA
Lactic Acid, Venous: 4 mmol/L (ref 0.5–1.9)
Lactic Acid, Venous: 8.4 mmol/L (ref 0.5–1.9)
Lactic Acid, Venous: 0.9 mmol/L (ref 0.5–1.9)

## 2022-04-25 LAB — MAGNESIUM: Magnesium: 3.1 mg/dL — ABNORMAL HIGH (ref 1.7–2.4)

## 2022-04-25 LAB — TROPONIN I (HIGH SENSITIVITY)
Troponin I (High Sensitivity): 24 ng/L — ABNORMAL HIGH (ref <18)
Troponin I (High Sensitivity): 21 ng/L — ABNORMAL HIGH (ref <18)

## 2022-04-25 LAB — CBC
HCT: 41.4 % (ref 39.0–52.0)
Hemoglobin: 14.4 g/dL (ref 13.0–17.0)
MCH: 34.3 pg — ABNORMAL HIGH (ref 26.0–34.0)
MCHC: 34.8 g/dL (ref 30.0–36.0)
MCV: 98.6 fL (ref 80.0–100.0)
Platelets: 1005 10*3/uL (ref 150–400)
RBC: 4.2 MIL/uL — ABNORMAL LOW (ref 4.22–5.81)
RDW: 13.9 % (ref 11.5–15.5)
WBC: 9.6 10*3/uL (ref 4.0–10.5)
nRBC: 0 % (ref 0.0–0.2)

## 2022-04-25 LAB — CREATININE, SERUM
Creatinine, Ser: 1.68 mg/dL — ABNORMAL HIGH (ref 0.61–1.24)
GFR, Estimated: 54 mL/min — ABNORMAL LOW (ref >60)

## 2022-04-25 LAB — LIPASE, BLOOD
Lipase: 883 U/L — ABNORMAL HIGH (ref 11–51)
Lipase: 895 U/L — ABNORMAL HIGH (ref 11–51)

## 2022-04-25 LAB — ETHANOL: Alcohol, Ethyl (B): 10 mg/dL — ABNORMAL HIGH (ref <10)

## 2022-04-25 IMAGING — CT CT ABD-PELV W/O CM
2 of 4 series · 15 of 46 positions shown, 17 images · non-contrast
Comparison: CT abdomen pelvis dated [DATE].

CLINICAL DATA: Concern for acute pancreatitis.



[Series 3: abd/ pelvis 5.0 i30f 2 · axial · 0.75mm/px · z∈[+800,+1240]mm · 12 of 98 slices shown, 14 images]
[im 5/98  soft-tissue]
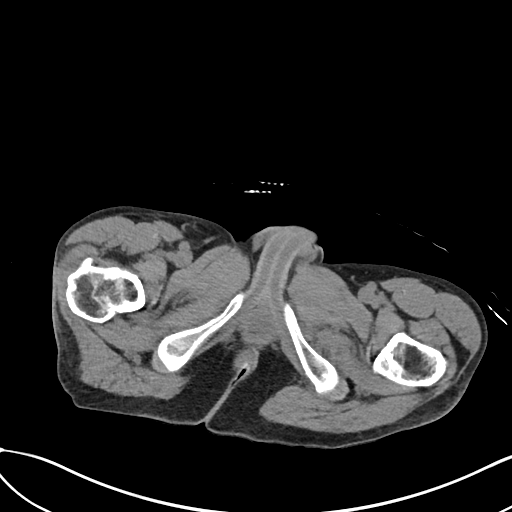
[im 5/98  bone]
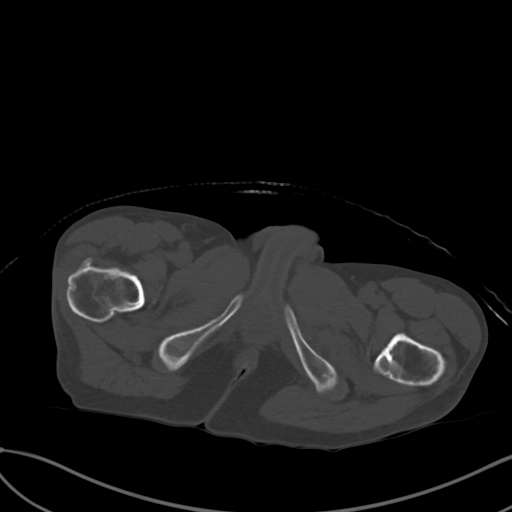
[im 14/98  soft-tissue]
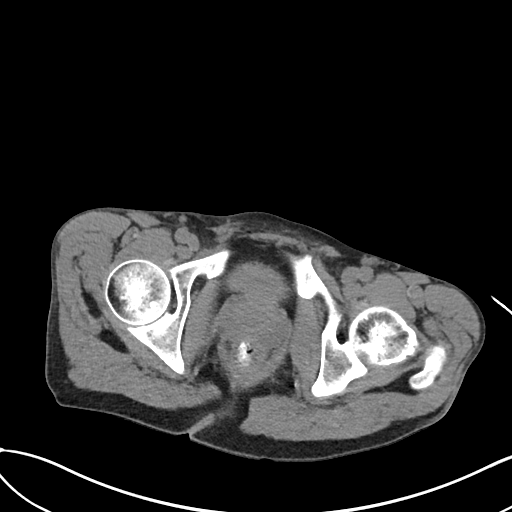
[im 23/98  soft-tissue]
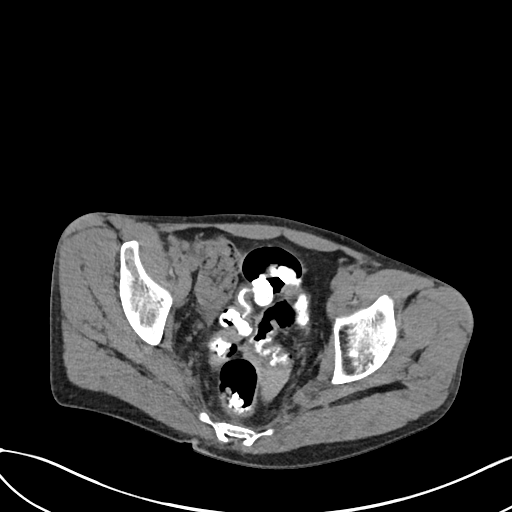
[im 31/98  soft-tissue]
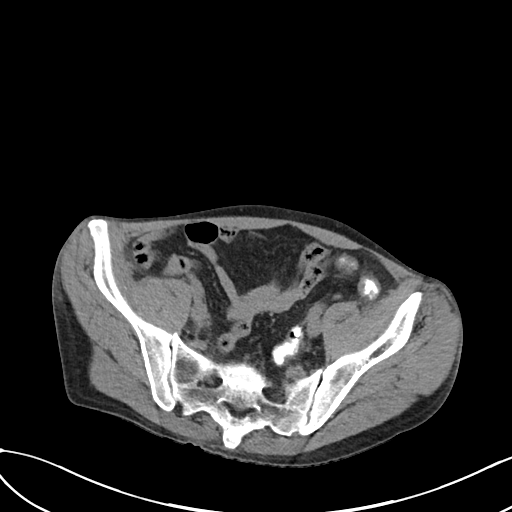
[im 36/98  soft-tissue]
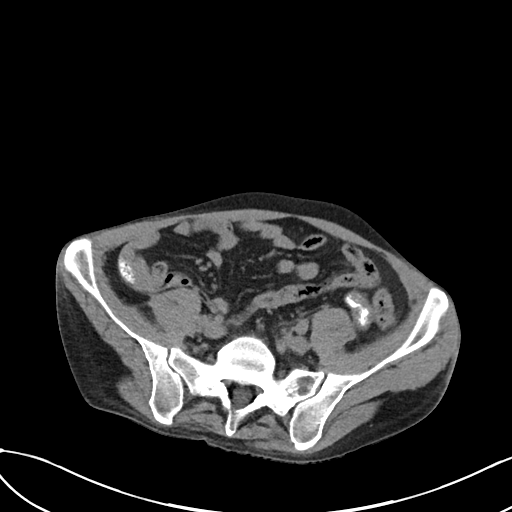
[im 45/98  soft-tissue]
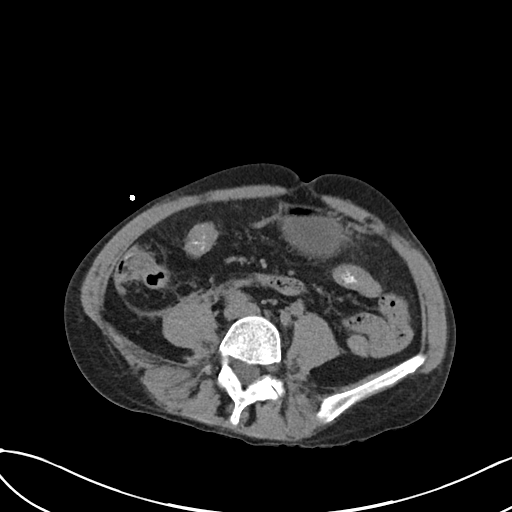
[im 53/98  soft-tissue]
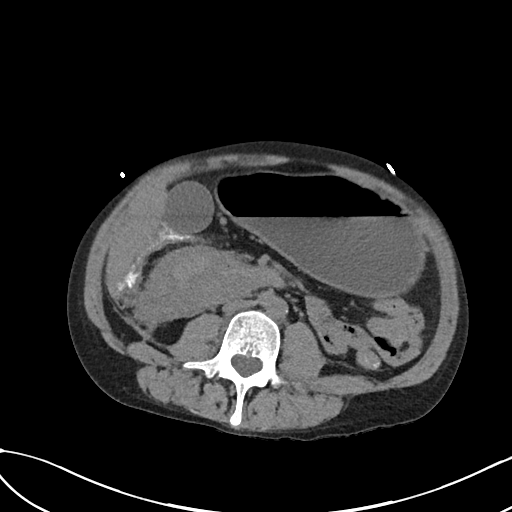
[im 62/98  soft-tissue]
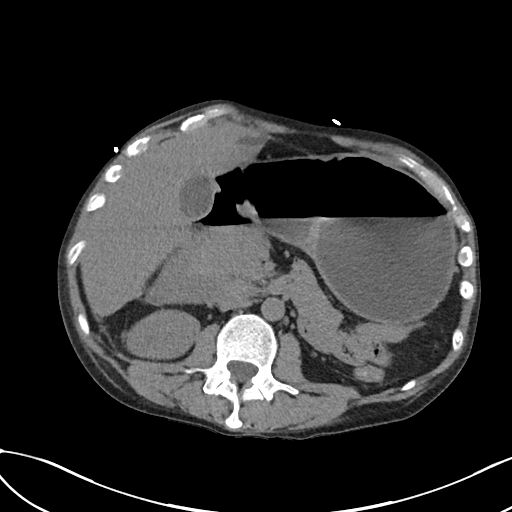
[im 67/98  soft-tissue]
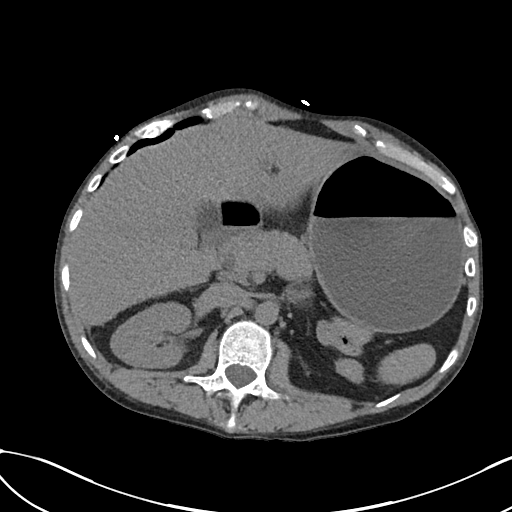
[im 67/98  bone]
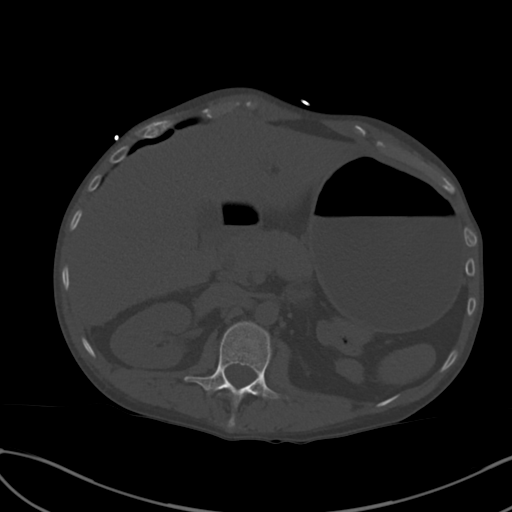
[im 75/98  soft-tissue]
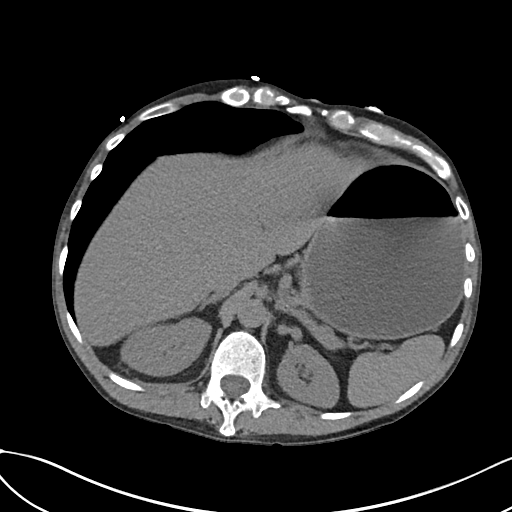
[im 84/98  soft-tissue]
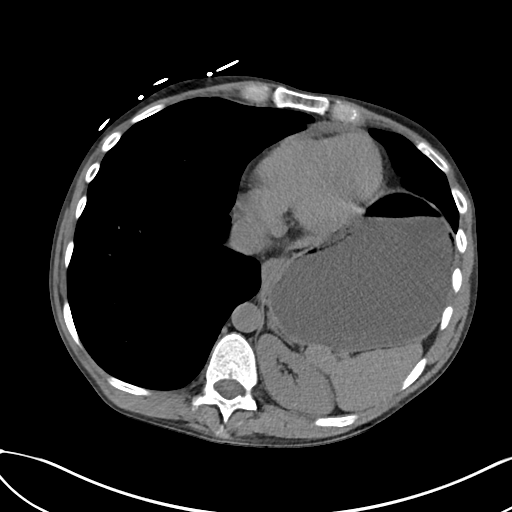
[im 93/98  soft-tissue]
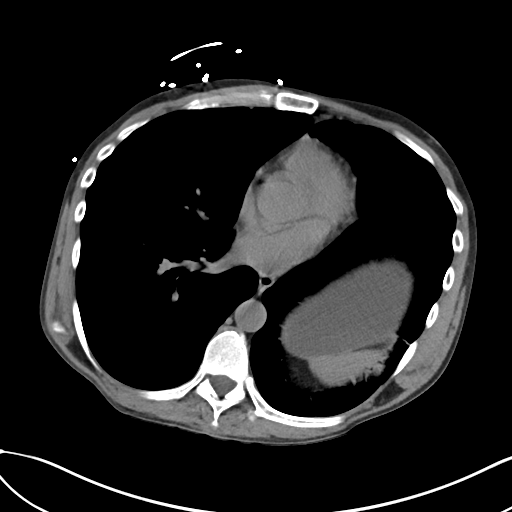

[Series 6: cor st · coronal · 0.71mm/px · 3 of 101 slices shown]
[im 34/101  soft-tissue]
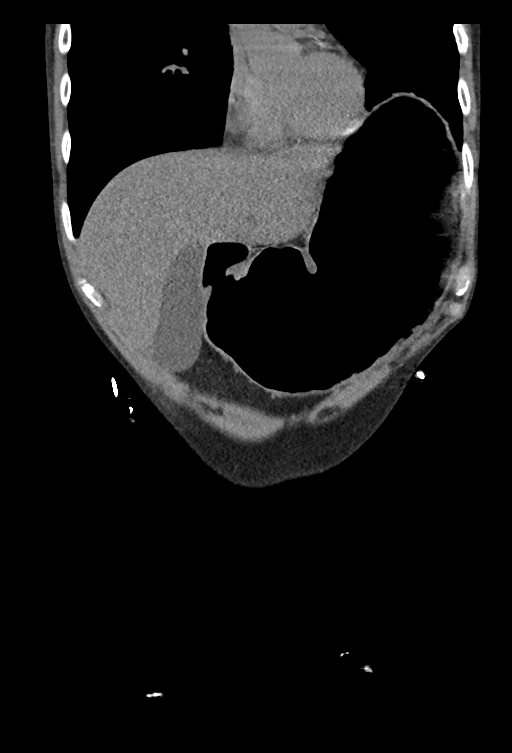
[im 45/101  soft-tissue]
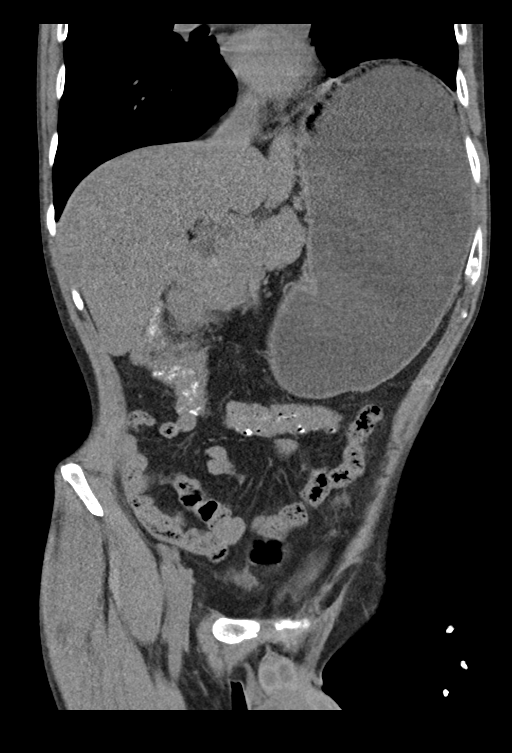
[im 56/101  soft-tissue]
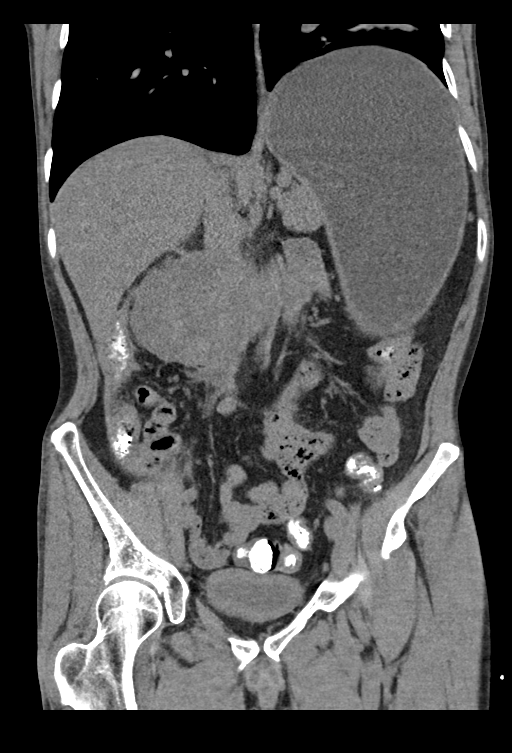

[15 of 46 positions shown; findings below may reference images not displayed]

FINDINGS: Evaluation of this exam is limited in the absence of intravenous
contrast.

Lower chest: There is mild eventration of the left hemidiaphragm the
left lung base subsegmental atelectasis. The visualized right lung
base is clear.

No intra-abdominal free air. Small high attenuating/hemorrhagic
fluid within the pelvis.

Hepatobiliary: Fatty liver. No intrahepatic biliary dilatation. The
gallbladder is unremarkable.

Pancreas: The body and tail of the pancreas appear unremarkable.
Fluid collection abutting the head of the pancreas appears slightly
smaller.

Spleen: Normal in size without focal abnormality.

Adrenals/Urinary Tract: The adrenal glands unremarkable. The
kidneys, visualized ureters, and urinary bladder appear
unremarkable.

Stomach/Bowel: The stomach is moderately distended with air and
fluid. Similar or slightly decreased size of the fluid collection
abutting the duodenal C-loop and head of the pancreas compared to
prior CT. Findings may represent hemorrhagic pancreatitis or
duodenal hemorrhage secondary to trauma or coagulopathy. Underlying
ulcer or mass is not excluded. Clinical correlation is recommended.
No evidence of small-bowel obstruction. The appendix is normal.

Vascular/Lymphatic: The abdominal aorta and IVC unremarkable. No
portal venous gas. There is no adenopathy.

Reproductive: The prostate and seminal vesicles are grossly
unremarkable. No pelvic mass.

Other: None

Musculoskeletal: No acute or significant osseous findings.
IMPRESSION: 1. Overall slight interval decrease in the size of the fluid
collection abutting the head of the pancreas compared to prior CT.
Findings in keeping with provided history of pancreatitis. Areas of
higher attenuation within this collection suggest hemorrhage.
2. Interval distension of the stomach suggestive of a degree of
obstruction at the level of the duodenum.
3. Fatty liver.

## 2022-04-25 MED ORDER — LACTATED RINGERS IV BOLUS
1000.0000 mL | Freq: Once | INTRAVENOUS | Status: AC
  Administered 2022-04-25: 1000 mL via INTRAVENOUS

## 2022-04-25 MED ORDER — LACTATED RINGERS IV BOLUS
1000.0000 mL | Freq: Once | INTRAVENOUS | Status: AC
Start: 2022-04-25 — End: 2022-04-25
  Administered 2022-04-25: 1000 mL via INTRAVENOUS

## 2022-04-25 MED ORDER — ADULT MULTIVITAMIN W/MINERALS CH
1.0000 | ORAL_TABLET | Freq: Every day | ORAL | Status: DC
  Administered 2022-04-26 – 2022-05-03 (×8): 1 via ORAL

## 2022-04-25 MED ORDER — RINGERS IV SOLN: INTRAVENOUS | Status: DC

## 2022-04-25 MED ORDER — THIAMINE HCL 100 MG/ML IJ SOLN
100.0000 mg | Freq: Every day | INTRAMUSCULAR | Status: DC
  Administered 2022-04-25 – 2022-05-02 (×3): 100 mg via INTRAVENOUS

## 2022-04-25 MED ORDER — M.V.I. ADULT IV INJ
INTRAVENOUS | Status: AC
  Filled 2022-04-25: qty 10

## 2022-04-25 MED ORDER — ENOXAPARIN SODIUM 40 MG/0.4ML IJ SOSY
40.0000 mg | PREFILLED_SYRINGE | INTRAMUSCULAR | Status: DC
  Administered 2022-04-26 – 2022-05-02 (×7): 40 mg via SUBCUTANEOUS

## 2022-04-25 MED ORDER — PROCHLORPERAZINE EDISYLATE 10 MG/2ML IJ SOLN
10.0000 mg | Freq: Four times a day (QID) | INTRAMUSCULAR | Status: DC | PRN
  Administered 2022-04-25 – 2022-05-02 (×4): 10 mg via INTRAVENOUS

## 2022-04-25 MED ORDER — ACETAMINOPHEN 325 MG PO TABS
325.0000 mg | ORAL_TABLET | Freq: Four times a day (QID) | ORAL | Status: DC | PRN
  Administered 2022-05-02: 325 mg via ORAL

## 2022-04-25 MED ORDER — POTASSIUM CHLORIDE 10 MEQ/100ML IV SOLN
10.0000 meq | INTRAVENOUS | Status: AC
  Administered 2022-04-25 – 2022-04-26 (×2): 10 meq via INTRAVENOUS

## 2022-04-25 MED ORDER — OXYCODONE HCL 5 MG PO TABS
5.0000 mg | ORAL_TABLET | Freq: Four times a day (QID) | ORAL | Status: DC | PRN
  Administered 2022-04-26: 5 mg via ORAL

## 2022-04-25 MED ORDER — LORAZEPAM 1 MG PO TABS
1.0000 mg | ORAL_TABLET | ORAL | Status: AC | PRN
  Administered 2022-04-26: 2 mg via ORAL
  Administered 2022-04-26: 4 mg via ORAL
  Administered 2022-04-27: 1 mg via ORAL
  Administered 2022-04-27 (×2): 2 mg via ORAL

## 2022-04-25 MED ORDER — LEVETIRACETAM IN NACL 500 MG/100ML IV SOLN
500.0000 mg | Freq: Two times a day (BID) | INTRAVENOUS | Status: AC
  Administered 2022-04-25 – 2022-04-27 (×4): 500 mg via INTRAVENOUS
  Filled 2022-04-25 (×3): qty 100

## 2022-04-25 MED ORDER — ONDANSETRON 4 MG PO TBDP
4.0000 mg | ORAL_TABLET | Freq: Once | ORAL | Status: AC
  Administered 2022-04-25: 4 mg via ORAL
  Filled 2022-04-25: qty 1

## 2022-04-25 MED ORDER — LORAZEPAM 2 MG/ML IJ SOLN
0.0000 mg | Freq: Two times a day (BID) | INTRAMUSCULAR | Status: AC
  Administered 2022-04-27: 3 mg via INTRAVENOUS
  Administered 2022-04-28 (×2): 2 mg via INTRAVENOUS
  Administered 2022-04-29: 1 mg via INTRAVENOUS

## 2022-04-25 MED ORDER — POTASSIUM CHLORIDE IN NACL 40-0.9 MEQ/L-% IV SOLN: INTRAVENOUS | Status: DC

## 2022-04-25 MED ORDER — POLYETHYLENE GLYCOL 3350 17 G PO PACK
17.0000 g | PACK | Freq: Every day | ORAL | Status: DC | PRN
  Administered 2022-05-01: 17 g via ORAL

## 2022-04-25 MED ORDER — HYDROMORPHONE HCL 1 MG/ML IJ SOLN
0.5000 mg | INTRAMUSCULAR | Status: DC | PRN
  Administered 2022-04-25: 0.5 mg via INTRAVENOUS

## 2022-04-25 MED ORDER — FOLIC ACID 1 MG PO TABS
1.0000 mg | ORAL_TABLET | Freq: Every day | ORAL | Status: DC
  Administered 2022-04-26 – 2022-05-03 (×8): 1 mg via ORAL

## 2022-04-25 MED ORDER — THIAMINE HCL 100 MG PO TABS
100.0000 mg | ORAL_TABLET | Freq: Every day | ORAL | Status: DC
  Administered 2022-04-26 – 2022-05-03 (×6): 100 mg via ORAL

## 2022-04-25 MED ORDER — LORAZEPAM 2 MG/ML IJ SOLN
1.0000 mg | INTRAMUSCULAR | Status: AC | PRN
  Administered 2022-04-26: 4 mg via INTRAVENOUS
  Administered 2022-04-26 – 2022-04-28 (×3): 2 mg via INTRAVENOUS

## 2022-04-25 MED ORDER — LORAZEPAM 2 MG/ML IJ SOLN
0.0000 mg | Freq: Four times a day (QID) | INTRAMUSCULAR | Status: AC
  Administered 2022-04-25 – 2022-04-26 (×2): 2 mg via INTRAVENOUS
  Administered 2022-04-26: 4 mg via INTRAVENOUS
  Administered 2022-04-26: 1 mg via INTRAVENOUS
  Administered 2022-04-26: 4 mg via INTRAVENOUS
  Administered 2022-04-27 (×2): 1 mg via INTRAVENOUS

## 2022-04-25 NOTE — H&P (Addendum)
History and Physical  Gary [name] VWP:[id number] DOB: [birth year] DOA: 04/25/2022  Referring physician: Dr. Manus Gunning, EDP  PCP: Pcp, No  Outpatient Specialists: None Patient coming from: Homeless  Chief Complaint: Abdominal pain, nausea and vomiting  HPI: Gary [name] is a 36 y.o. male with medical history significant for HIV with poor adherence to medication, seizure disorder, alcohol abuse, tobacco abuse, alcoholic pancreatitis, medical noncompliance, homelessness, who presented to [facility] ED with complaints of severe diffuse abdominal pain, intractable nausea and vomiting since leaving the hospital AGAINST MEDICAL ADVICE 2 days ago, not able to tolerate any oral intake including his habitual alcohol intake.    The patient was recently admitted on 04/16/2022 for the same and left AGAINST MEDICAL ADVICE on the 32 th of June.  During that admission he was treated for acute alcoholic pancreatitis and alcohol withdrawal.  There was a questionable mass in third portion of duodenum/markedly thickened ascending seen on CT scan.  Post EGD with enteroscopy, it did not show a mass, likely was a possible pseudocyst/pancreatic collection that was compressing the duodenum.  Repeat CT scan showed significant decrease in the peripancreatic collection.  The plan at that time was to continue supportive care.    During his previous admission, the patient was also seen by infectious disease who recommended to restart Biktarvy since he was improving from a pancreatitis standpoint.  This time, brought in via EMS, picked up from the side of the road with intractable nausea and vomiting and diffuse severe abdominal pain.  Work-up in the ED reveals acute pancreatitis seen on CT scan with lipase level greater than 800.  Received IV fluid hydration, IV antiemetics, and IV opioids in the ED.  TRH, hospitalist service was asked to admit.  ED Course: Tmax 98.5.  BP 124/86, pulse 114, respiration rate 16, O2 saturation 94% on  room air.  Lab studies remarkable for hemoglobin 12.6 WBC 9.6, platelet 1,0005.  Review of Systems: Review of systems as noted in the HPI. All other systems reviewed and are negative.   Past Medical History:  Diagnosis Date   Alcohol abuse    HIV (human immunodeficiency virus infection) (HCC)    Seizures (HCC)    Subdural hematoma (HCC)    Past Surgical History:  Procedure Laterality Date   BIOPSY  04/19/2022   Procedure: BIOPSY;  Surgeon: Meridee Score Netty Starring., MD;  Location: [facility] ENDOSCOPY;  Service: Gastroenterology;;   ENTEROSCOPY N/A 04/19/2022   Procedure: ENTEROSCOPY;  Surgeon: Lemar Lofty., MD;  Location: [facility] ENDOSCOPY;  Service: Gastroenterology;  Laterality: N/A;   INCISION AND DRAINAGE PERIRECTAL ABSCESS N/A 09/24/2016   Procedure: IRRIGATION AND DEBRIDEMENT PERIRECTAL ABSCESS;  Surgeon: Ricarda Frame, MD;  Location: [facility] ORS;  Service: General;  Laterality: N/A;   none      Social History:  reports that he has been smoking cigarettes. He has been smoking an average of .5 packs per day. He has never used smokeless tobacco. He reports current alcohol use of about 105.0 standard drinks of alcohol per week. He reports current drug use. Drugs: Methamphetamines and Marijuana.   Allergies  Allergen Reactions   Tegretol [Carbamazepine] Other (See Comments)    Caused vertigo for 2 days after taking it   Caffeine Palpitations    Family History  Problem Relation Age of Onset   Alcohol abuse Mother    Alcohol abuse Father    Colon cancer Other    Other Other    Cancer Other  Prior to Admission medications   Medication Sig Start Date End Date Taking? Authorizing Provider  bictegravir-emtricitabine-tenofovir AF (BIKTARVY) 50-200-25 MG TABS tablet Take 1 tablet by mouth daily. 04/23/22   Ghimire, Werner Lean, MD  folic acid (FOLVITE) 1 MG tablet Take 1 tablet (1 mg total) by mouth daily. 04/23/22   Ghimire, Werner Lean, MD  gabapentin (NEURONTIN) 300 MG capsule  Take 1 capsule (300 mg total) by mouth 3 (three) times daily. 04/23/22   Ghimire, Werner Lean, MD  levETIRAcetam (KEPPRA) 500 MG tablet Take 1 tablet (500 mg total) by mouth 2 (two) times daily. 04/23/22   Ghimire, Werner Lean, MD  pantoprazole (PROTONIX) 40 MG tablet Take 1 tablet (40 mg total) by mouth 2 (two) times daily. 04/23/22   Ghimire, Werner Lean, MD  thiamine 100 MG tablet Take 1 tablet (100 mg total) by mouth daily. 04/24/22   Ghimire, Werner Lean, MD  famotidine (PEPCID) 20 MG tablet Take 1 tablet (20 mg total) by mouth 2 (two) times daily. Patient not taking: Reported on 06/01/2019 05/14/19 06/02/19  Benjiman Core, MD    Physical Exam: BP (!) 130/98   Pulse (!) 109   Temp 98.5 F (36.9 C) (Oral)   Resp 18   SpO2 98%   General: 36 y.o. year-old male disheveled, emaciated, uncomfortable due to intractable nausea and abdominal pain.  Alert and oriented x3. Cardiovascular: Tachycardic with no rubs or gallops.  No thyromegaly or JVD noted.  No lower extremity edema. 2/4 pulses in all 4 extremities. Respiratory: Clear to auscultation with no wheezes or rales. Good inspiratory effort. Abdomen: Soft nontender nondistended with normal bowel sounds x4 quadrants. Muskuloskeletal: No cyanosis, clubbing or edema noted bilaterally Neuro: CN II-XII intact, strength, sensation, reflexes Skin: No ulcerative lesions noted or rashes Psychiatry: Judgement and insight appear normal. Mood is appropriate for condition and setting          Labs on Admission:  Basic Metabolic Panel: Recent Labs  Lab 04/19/22 0240 04/20/22 0137 04/21/22 0110 04/23/22 0058 04/25/22 1609 04/25/22 1746 04/25/22 1948  NA 133* 133* 133* 131* 137  --  134*  K 3.1* 3.6 3.6 3.8 3.9  --  3.0*  CL 96* 101 100 98 83*  --   --   CO2 28 26 28 24 24   --   --   GLUCOSE 85 92 108* 95 124*  --   --   BUN 5* <5* <5* 5* 39*  --   --   CREATININE 0.66 0.63 0.60* 0.66 3.37*  --   --   CALCIUM 8.2* 8.2* 9.0 8.5* 10.4*  --   --   MG  1.7 2.0 2.0 1.7  --  3.1*  --   PHOS 3.0 3.4 3.6  --   --   --   --    Liver Function Tests: Recent Labs  Lab 04/19/22 0240 04/20/22 0137 04/21/22 0110 04/23/22 0058 04/25/22 1609  AST 29 24 37 25 43*  ALT 18 17 20 20 24   ALKPHOS 33* 37* 41 42 68  BILITOT 1.0 1.1 1.3* 1.0 1.5*  PROT 5.0* 5.4* 5.7* 6.0* 9.7*  ALBUMIN 2.7* 2.8* 3.0* 3.0* 5.3*   Recent Labs  Lab 04/22/22 0921 04/25/22 1609 04/25/22 1746  LIPASE 307* 883* 895*   No results for input(s): "AMMONIA" in the last 168 hours. CBC: Recent Labs  Lab 04/19/22 0240 04/20/22 0137 04/21/22 0110 04/23/22 0058 04/25/22 1609 04/25/22 1948  WBC 2.9* 2.2* 3.0* 3.7* 9.6  --  HGB 7.4* 7.2* 8.0* 8.4* 14.4 12.6*  HCT 22.2* 21.3* 23.5* 25.7* 41.4 37.0*  MCV 102.3* 102.4* 101.7* 102.4* 98.6  --   PLT 86* 110* 171 253 1,005*  --    Cardiac Enzymes: No results for input(s): "CKTOTAL", "CKMB", "CKMBINDEX", "TROPONINI" in the last 168 hours.  BNP (last 3 results) No results for input(s): "BNP" in the last 8760 hours.  ProBNP (last 3 results) No results for input(s): "PROBNP" in the last 8760 hours.  CBG: Recent Labs  Lab 04/21/22 0050  GLUCAP 122*    Radiological Exams on Admission: CT ABDOMEN PELVIS WO CONTRAST  Result Date: 04/25/2022 CLINICAL DATA:  Concern for acute pancreatitis. EXAM: CT ABDOMEN AND PELVIS WITHOUT CONTRAST TECHNIQUE: Multidetector CT imaging of the abdomen and pelvis was performed following the standard protocol without IV contrast. RADIATION DOSE REDUCTION: This exam was performed according to the departmental dose-optimization program which includes automated exposure control, adjustment of the mA and/or kV according to patient size and/or use of iterative reconstruction technique. COMPARISON:  CT abdomen pelvis dated 04/23/2022. FINDINGS: Evaluation of this exam is limited in the absence of intravenous contrast. Lower chest: There is mild eventration of the left hemidiaphragm the left lung base  subsegmental atelectasis. The visualized right lung base is clear. No intra-abdominal free air. Small high attenuating/hemorrhagic fluid within the pelvis. Hepatobiliary: Fatty liver. No intrahepatic biliary dilatation. The gallbladder is unremarkable. Pancreas: The body and tail of the pancreas appear unremarkable. Fluid collection abutting the head of the pancreas appears slightly smaller. Spleen: Normal in size without focal abnormality. Adrenals/Urinary Tract: The adrenal glands unremarkable. The kidneys, visualized ureters, and urinary bladder appear unremarkable. Stomach/Bowel: The stomach is moderately distended with air and fluid. Similar or slightly decreased size of the fluid collection abutting the duodenal C-loop and head of the pancreas compared to prior CT. Findings may represent hemorrhagic pancreatitis or duodenal hemorrhage secondary to trauma or coagulopathy. Underlying ulcer or mass is not excluded. Clinical correlation is recommended. No evidence of small-bowel obstruction. The appendix is normal. Vascular/Lymphatic: The abdominal aorta and IVC unremarkable. No portal venous gas. There is no adenopathy. Reproductive: The prostate and seminal vesicles are grossly unremarkable. No pelvic mass. Other: None Musculoskeletal: No acute or significant osseous findings. IMPRESSION: 1. Overall slight interval decrease in the size of the fluid collection abutting the head of the pancreas compared to prior CT. Findings in keeping with provided history of pancreatitis. Areas of higher attenuation within this collection suggest hemorrhage. 2. Interval distension of the stomach suggestive of a degree of obstruction at the level of the duodenum. 3. Fatty liver. Electronically Signed   By: Elgie Collard M.D.   On: 04/25/2022 19:32   DG Chest Portable 1 View  Result Date: 04/25/2022 CLINICAL DATA:  Altered mental status. EXAM: PORTABLE CHEST 1 VIEW COMPARISON:  One-view chest x-ray 04/16/2022 FINDINGS:  Heart size is normal. Chronic elevation of left hemidiaphragm noted. Lungs are clear. Axial skeleton is unremarkable. IMPRESSION: No acute cardiopulmonary disease. Electronically Signed   By: Marin Roberts M.D.   On: 04/25/2022 19:01    EKG: I independently viewed the EKG done and my findings are as followed: Sinus tachycardia rate of 123, nonspecific ST-T changes.  QTc 609.  Assessment/Plan Present on Admission:  Acute pancreatitis  Principal Problem:   Acute pancreatitis  Acute alcoholic pancreatitis Presents with severe diffuse abdominal pain elevated lipase level greater than 800 Noncontrast CT abdomen showing overall slight interval decrease in the size of the fluid collection Abutting  the head of the pancreas compared to prior CT.  Findings in keeping with provided history of pancreatitis.  Areas of high attenuation within these collections suggest hemorrhage.  Interval distention of the stomach suggestive of a degree of obstruction at the level of the duodenum.  Fatty liver. Received 2 L LR IV fluid boluses in the ED Continue IV fluid hydration LR at 125 cc/h x 2 days Monitor volume status while on IV fluid IV antiemetics as needed IV analgesics as needed Start clear liquid diet when clinically improved, and advance diet as tolerated Chemistry panel in the morning Lipase level in the morning  Severe dehydration secondary to poor oral intake, intractable nausea and vomiting IV fluid hydration as stated above IV antiemetics as needed as stated above  AKI, prerenal, secondary to severe dehydration Presented with creatinine of 3.37 with GFR of 23 Baseline creatinine 0.66 with GFR greater than 60 Monitor urine output Continue IV fluid hydration  Hypocalcemia in the setting of severe dehydration Serum calcium 10.4, albumin 5.3 IV fluid hydration  Lactic acidosis in the setting of hypovolemia Lactic acid 4.0 Trend IV fluid hydration  Alcohol abuse with concern for  alcohol withdrawal CIWA protocol Multivitamins, folic acid and thiamine supplements Alcohol abuse cessation counseling at bedside  Tobacco use disorder Advised that tobacco can contribute to acute pancreatitis Receptive and interested in quitting Smokes 3 to 4 cigarettes/day Nicotine patch as needed  Elevated liver chemistries in the setting of alcohol abuse Monitor and trend  Prolonged QTc Avoid QTc prolonging agents Optimize magnesium and potassium levels Repeat her EKG in the morning  HIV with medication non-adherence Resume Biktarvy when clinically improved  Seizure disorder Resume home Keppra, switched to IV since unable to tolerate po  Resume home gabapentin when tolerates po  GERD Resume home PPI when QTC has improved.  QTC is greater than 600.  Severe protein calorie malnutrition Severe muscle mass loss Weight 70 kg When symptomatology has improved encourage oral intake    Critical care time: 65 minutes.     DVT prophylaxis: Subcu Lovenox daily  Code Status: Full code  Family Communication: None at bedside  Disposition Plan: Admitted to telemetry medical unit  Consults called: None.  Admission status: Inpatient status.   Status is: Inpatient The patient requires at least 2 midnights for further evaluation and treatment of present condition.   Darlin Drop MD [facility] Pager [phone]  If 7PM-7AM, please contact night-coverage [email or web address] Password [facility]  04/25/2022, 8:22 PM

## 2022-04-25 NOTE — ED Triage Notes (Signed)
Pt via [facility] from side of the road, "wants help w alcohol use." Reports use earlier today but "didn't even get it down before throwing it up." Reports dehydration, wants "water."  116/84 118
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
## 2022-04-25 NOTE — ED Notes (Signed)
Pt refusing offered zofran, states it's "not strong enough." Advised to let staff know if he changes his mind.

## 2022-04-25 NOTE — ED Notes (Signed)
Unable to collect urine at 

## 2022-04-26 DIAGNOSIS — F10930 Alcohol use, unspecified with withdrawal, uncomplicated: Secondary | ICD-10-CM

## 2022-04-26 DIAGNOSIS — N179 Acute kidney failure, unspecified: Secondary | ICD-10-CM

## 2022-04-26 DIAGNOSIS — E860 Dehydration: Secondary | ICD-10-CM

## 2022-04-26 LAB — CBC WITH DIFFERENTIAL/PLATELET
Abs Immature Granulocytes: 0.03 10*3/uL (ref 0.00–0.07)
Basophils Absolute: 0 10*3/uL (ref 0.0–0.1)
Basophils Relative: 1 %
Eosinophils Absolute: 0.1 10*3/uL (ref 0.0–0.5)
Eosinophils Relative: 2 %
HCT: 27.5 % — ABNORMAL LOW (ref 39.0–52.0)
Hemoglobin: 9.5 g/dL — ABNORMAL LOW (ref 13.0–17.0)
Immature Granulocytes: 1 %
Lymphocytes Relative: 22 %
Lymphs Abs: 1 10*3/uL (ref 0.7–4.0)
MCH: 34.1 pg — ABNORMAL HIGH (ref 26.0–34.0)
MCHC: 34.5 g/dL (ref 30.0–36.0)
MCV: 98.6 fL (ref 80.0–100.0)
Monocytes Absolute: 0.7 10*3/uL (ref 0.1–1.0)
Monocytes Relative: 16 %
Neutro Abs: 2.7 10*3/uL (ref 1.7–7.7)
Neutrophils Relative %: 58 %
Platelets: 406 10*3/uL — ABNORMAL HIGH (ref 150–400)
RBC: 2.79 MIL/uL — ABNORMAL LOW (ref 4.22–5.81)
RDW: 13.7 % (ref 11.5–15.5)
WBC: 4.5 10*3/uL (ref 4.0–10.5)
nRBC: 0 % (ref 0.0–0.2)

## 2022-04-26 LAB — URINALYSIS, ROUTINE W REFLEX MICROSCOPIC
Bacteria, UA: NONE SEEN
Bilirubin Urine: NEGATIVE
Glucose, UA: NEGATIVE mg/dL
Ketones, ur: 20 mg/dL — AB
Leukocytes,Ua: NEGATIVE
Nitrite: NEGATIVE
Protein, ur: 30 mg/dL — AB
Specific Gravity, Urine: 1.019 (ref 1.005–1.030)
pH: 5 (ref 5.0–8.0)

## 2022-04-26 LAB — RAPID URINE DRUG SCREEN, HOSP PERFORMED
Amphetamines: NOT DETECTED
Barbiturates: NOT DETECTED
Benzodiazepines: POSITIVE — AB
Cocaine: NOT DETECTED
Opiates: NOT DETECTED
Tetrahydrocannabinol: NOT DETECTED

## 2022-04-26 LAB — COMPREHENSIVE METABOLIC PANEL
ALT: 15 U/L (ref 0–44)
AST: 30 U/L (ref 15–41)
Albumin: 3.2 g/dL — ABNORMAL LOW (ref 3.5–5.0)
Alkaline Phosphatase: 36 U/L — ABNORMAL LOW (ref 38–126)
Anion gap: 12 (ref 5–15)
BUN: 21 mg/dL — ABNORMAL HIGH (ref 6–20)
CO2: 29 mmol/L (ref 22–32)
Calcium: 8.2 mg/dL — ABNORMAL LOW (ref 8.9–10.3)
Chloride: 91 mmol/L — ABNORMAL LOW (ref 98–111)
Creatinine, Ser: 1.17 mg/dL (ref 0.61–1.24)
GFR, Estimated: >60 mL/min (ref >60)
Glucose, Bld: 134 mg/dL — ABNORMAL HIGH (ref 70–99)
Potassium: 3.6 mmol/L (ref 3.5–5.1)
Sodium: 132 mmol/L — ABNORMAL LOW (ref 135–145)
Total Bilirubin: 1 mg/dL (ref 0.3–1.2)
Total Protein: 6 g/dL — ABNORMAL LOW (ref 6.5–8.1)

## 2022-04-26 LAB — PHOSPHORUS: Phosphorus: 3.7 mg/dL (ref 2.5–4.6)

## 2022-04-26 LAB — MAGNESIUM: Magnesium: 2.2 mg/dL (ref 1.7–2.4)

## 2022-04-26 LAB — LIPASE, BLOOD: Lipase: 823 U/L — ABNORMAL HIGH (ref 11–51)

## 2022-04-26 MED ORDER — OXYCODONE HCL 5 MG PO TABS
5.0000 mg | ORAL_TABLET | Freq: Four times a day (QID) | ORAL | Status: DC | PRN
  Administered 2022-04-26: 5 mg via ORAL
  Administered 2022-04-26 – 2022-04-27 (×2): 10 mg via ORAL
  Administered 2022-04-27: 5 mg via ORAL
  Administered 2022-04-28 – 2022-05-02 (×6): 10 mg via ORAL
  Administered 2022-05-02: 5 mg via ORAL
  Administered 2022-05-02 – 2022-05-03 (×2): 10 mg via ORAL

## 2022-04-26 MED ORDER — LACTATED RINGERS IV BOLUS
1000.0000 mL | Freq: Once | INTRAVENOUS | Status: AC
  Administered 2022-04-26: 1000 mL via INTRAVENOUS

## 2022-04-26 MED ORDER — LORAZEPAM 2 MG/ML IJ SOLN
1.0000 mg | INTRAMUSCULAR | Status: DC | PRN
  Administered 2022-04-28 – 2022-05-02 (×14): 1 mg via INTRAVENOUS

## 2022-04-26 MED ORDER — LACTATED RINGERS IV SOLN: INTRAVENOUS | Status: AC

## 2022-04-26 MED ORDER — HYDROMORPHONE HCL 1 MG/ML IJ SOLN
0.5000 mg | INTRAMUSCULAR | Status: DC | PRN
  Administered 2022-04-26: 1 mg via INTRAVENOUS
  Administered 2022-04-27 (×3): 0.5 mg via INTRAVENOUS
  Administered 2022-04-28 – 2022-05-01 (×16): 1 mg via INTRAVENOUS

## 2022-04-26 NOTE — Progress Notes (Signed)
[facility]                                                                               Gary [name], is a 36 y.o. male, DOB - Oct 23, 1986, YIA:[id number] Admit date - 04/25/2022    Outpatient Primary MD for the patient is Pcp, No  LOS - 1  days    Brief summary   36 year old gentleman with prior history of HIV with poor adherence to medication, seizure disorder, alcohol, tobacco abuse medical noncompliance homelessness, history of alcohol pancreatitis admitted on the April 16, 2022 for alcohol withdrawal and acute alcohol pancreatitis and left AMA on June 15, presents today with persistent nausea vomiting and abdominal pain.  Patient reports his last drink was on Saturday morning. Of note there was questionable mass in the third portion of the duodenum/marked thickened ascending colon on CT scan post EGD with enteroscopy did not show a mass probably possible pseudocyst/pancreatic collection compressing the duodenum.  Repeat CT scan of the abdomen and pelvis without contrast this admission shows significant decrease in the peripancreatic collection. Patient was found on the side of the road with intractable nausea vomiting and severe diffuse abdominal pain and was picked up by EMS.  Further work-up showed acute pancreatitis with lipase levels greater than 800.  He was admitted for IV fluids IV antiemetics and pain control.   Assessment & Plan    Assessment and Plan:  Acute alcohol pancreatitis secondary to continuous alcohol abuse On admission patient lipase levels greater than 800. Noncontrast CT showing slight decrease in the size of the fluid collection when compared to the previous CT. Interval distention of the stomach suggestive for degree of obstruction at the level of duodenum. Continue with IV fluids, aggressive hydration , IV antiemetics and IV pain control. Patient was started on clear liquid diet for now. Continue to follow lipase levels    Severe  dehydration secondary to poor oral intake with intractable nausea and vomiting Recommend to continue with IV fluids and IV antiemetics.    AKI secondary to severe dehydration This continue with IV fluids, creatinine appears to be at baseline.    Hypokalemia Replaced    Mild hyponatremia probably secondary to alcohol intoxication.  Severe protein calorie malnutrition We will request dietary consult.    Alcohol withdrawal in the setting of persistent and severe alcohol abuse Patient was started on CIWA protocol Continue with multivitamin folic acid and thiamine supplements.  Patient will benefit from inpatient alcohol rehab.    Prolonged QTc Avoid QTc prolonging medications, keep magnesium greater than 2 and potassium greater than 4. Check EKG in the morning.    Elevated liver chemistries in the setting of alcohol abuse Continue to monitor.   Tobacco use disorder Nicotine patch ordered.    HIV with medication nonadherence  resume Biktarvy when clinically improved       Estimated body mass index is 21.08 kg/m as calculated from the following:   Height as of 04/16/22: 6' (1.829 m).   Weight as of 04/16/22: 70.5 kg.  Code Status: Full code DVT Prophylaxis:  enoxaparin (LOVENOX) injection 40 mg Start: 04/25/22 2200   Level of Care: Level of care:  Telemetry Medical Family Communication: None at bedside  Disposition Plan:     Remains inpatient appropriate: IV fluids and IV pain control and CIWA protocol for alcohol withdrawal  Procedures:   None  Antimicrobials:   Anti-infectives (From admission, onward)    None        Medications  Scheduled Meds:  enoxaparin (LOVENOX) injection  40 mg Subcutaneous Q24H   folic acid  1 mg Oral Daily   LORazepam  0-4 mg Intravenous Q6H   Followed by   Melene Muller ON 04/27/2022] LORazepam  0-4 mg Intravenous Q12H   multivitamin with minerals  1 tablet Oral Daily   thiamine  100 mg Oral Daily   Or   thiamine  100 mg  Intravenous Daily   Continuous Infusions:  levETIRAcetam Stopped (04/25/22 2331)   ringers 125 mL/hr at 04/26/22 0822   PRN Meds:.acetaminophen, HYDROmorphone (DILAUDID) injection, LORazepam, LORazepam **OR** LORazepam, oxyCODONE, polyethylene glycol, prochlorperazine    Subjective:   Gary [name] was seen and examined today.  Patient reports pain is better this morning, no nausea vomiting this morning Patient reports his last alcohol drink was Saturday morning.  Objective:   Vitals:   04/26/22 0900 04/26/22 0930 04/26/22 0950 04/26/22 1015  BP: (!) 98/54 116/76 111/68 (!) 96/57  Pulse: 74 85 73 76  Resp: 16 13  16   Temp:      TempSrc:      SpO2: 92% 95%  96%    Intake/Output Summary (Last 24 hours) at 04/26/2022 1021 Last data filed at 04/26/2022 1000 Gross per 24 hour  Intake 2319.46 ml  Output --  Net 2319.46 ml   There were no vitals filed for this visit.   Exam General: Cachectic looking gentleman not in any kind of distress Cardiovascular: S1 S2 auscultated, no murmurs, RRR Respiratory: Clear to auscultation bilaterally, no wheezing, rales or rhonchi Gastrointestinal: Soft, tenderness generalized, no rebound tenderness, bowel sounds normal Ext: no pedal edema bilaterally Neuro: AAOx3, Cr N's II- XII. Strength 5/5 upper and lower extremities bilaterally Skin: No rashes Psych: Alert and oriented   Data Reviewed:  I have personally reviewed following labs and imaging studies   CBC Lab Results  Component Value Date   WBC 4.5 04/26/2022   RBC 2.79 (L) 04/26/2022   HGB 9.5 (L) 04/26/2022   HCT 27.5 (L) 04/26/2022   MCV 98.6 04/26/2022   MCH 34.1 (H) 04/26/2022   PLT 406 (H) 04/26/2022   MCHC 34.5 04/26/2022   RDW 13.7 04/26/2022   LYMPHSABS 1.0 04/26/2022   MONOABS 0.7 04/26/2022   EOSABS 0.1 04/26/2022   BASOSABS 0.0 04/26/2022     Last metabolic panel Lab Results  Component Value Date   NA 132 (L) 04/26/2022   K 3.6 04/26/2022   CL 91 (L)  04/26/2022   CO2 29 04/26/2022   BUN 21 (H) 04/26/2022   CREATININE 1.17 04/26/2022   GLUCOSE 134 (H) 04/26/2022   GFRNONAA >60 04/26/2022   GFRAA 124 03/20/2021   CALCIUM 8.2 (L) 04/26/2022   PHOS 3.7 04/26/2022   PROT 6.0 (L) 04/26/2022   ALBUMIN 3.2 (L) 04/26/2022   BILITOT 1.0 04/26/2022   ALKPHOS 36 (L) 04/26/2022   AST 30 04/26/2022   ALT 15 04/26/2022   ANIONGAP 12 04/26/2022    CBG (last 3)  No results for input(s): "GLUCAP" in the last 72 hours.    Coagulation Profile: No results for input(s): "INR", "PROTIME" in the last 168 hours.   Radiology Studies: CT ABDOMEN PELVIS  WO CONTRAST  Result Date: 04/25/2022 CLINICAL DATA:  Concern for acute pancreatitis. EXAM: CT ABDOMEN AND PELVIS WITHOUT CONTRAST TECHNIQUE: Multidetector CT imaging of the abdomen and pelvis was performed following the standard protocol without IV contrast. RADIATION DOSE REDUCTION: This exam was performed according to the departmental dose-optimization program which includes automated exposure control, adjustment of the mA and/or kV according to patient size and/or use of iterative reconstruction technique. COMPARISON:  CT abdomen pelvis dated 04/23/2022. FINDINGS: Evaluation of this exam is limited in the absence of intravenous contrast. Lower chest: There is mild eventration of the left hemidiaphragm the left lung base subsegmental atelectasis. The visualized right lung base is clear. No intra-abdominal free air. Small high attenuating/hemorrhagic fluid within the pelvis. Hepatobiliary: Fatty liver. No intrahepatic biliary dilatation. The gallbladder is unremarkable. Pancreas: The body and tail of the pancreas appear unremarkable. Fluid collection abutting the head of the pancreas appears slightly smaller. Spleen: Normal in size without focal abnormality. Adrenals/Urinary Tract: The adrenal glands unremarkable. The kidneys, visualized ureters, and urinary bladder appear unremarkable. Stomach/Bowel: The  stomach is moderately distended with air and fluid. Similar or slightly decreased size of the fluid collection abutting the duodenal C-loop and head of the pancreas compared to prior CT. Findings may represent hemorrhagic pancreatitis or duodenal hemorrhage secondary to trauma or coagulopathy. Underlying ulcer or mass is not excluded. Clinical correlation is recommended. No evidence of small-bowel obstruction. The appendix is normal. Vascular/Lymphatic: The abdominal aorta and IVC unremarkable. No portal venous gas. There is no adenopathy. Reproductive: The prostate and seminal vesicles are grossly unremarkable. No pelvic mass. Other: None Musculoskeletal: No acute or significant osseous findings. IMPRESSION: 1. Overall slight interval decrease in the size of the fluid collection abutting the head of the pancreas compared to prior CT. Findings in keeping with provided history of pancreatitis. Areas of higher attenuation within this collection suggest hemorrhage. 2. Interval distension of the stomach suggestive of a degree of obstruction at the level of the duodenum. 3. Fatty liver. Electronically Signed   By: Elgie Collard M.D.   On: 04/25/2022 19:32   DG Chest Portable 1 View  Result Date: 04/25/2022 CLINICAL DATA:  Altered mental status. EXAM: PORTABLE CHEST 1 VIEW COMPARISON:  One-view chest x-ray 04/16/2022 FINDINGS: Heart size is normal. Chronic elevation of left hemidiaphragm noted. Lungs are clear. Axial skeleton is unremarkable. IMPRESSION: No acute cardiopulmonary disease. Electronically Signed   By: Marin Roberts M.D.   On: 04/25/2022 19:01       Kathlen Mody M.D. [facility] 04/26/2022, 10:21 AM  Available via Epic secure chat 7am-7pm After 7 pm, please refer to night coverage provider listed on amion.

## 2022-04-26 NOTE — Evaluation (Signed)
Physical Therapy Evaluation and Discharge Patient Details Name: Gary [name] MRN: [id number] DOB: [birth year] Today's Date: 04/26/2022  History of Present Illness  Pt is a 36 y.o. M admitted 04/25/2022 with acute alcohol pancreatitis secondary to continuous alcohol abuse. Significant PMH: HIV, seizure disorder, alcohol, tobacco abuse.  Clinical Impression  PTA, pt is homeless and independent. Pt reporting 10/10 abdominal pain, however, is agreeable to work with PT. Pt overall is mobilizing fairly well; ambulating 380 ft with no assistive device modI. Reports slower gait speed in comparison to baseline. HR 75-102 bpm. Recommend continued mobilization while inpatient. No further acute PT needs. Thank you for this consult.       Recommendations for follow up therapy are one component of a multi-disciplinary discharge planning process, led by the attending physician.  Recommendations may be updated based on patient status, additional functional criteria and insurance authorization.  Follow Up Recommendations No PT follow up    Assistance Recommended at Discharge None  Patient can return home with the following       Equipment Recommendations None recommended by PT  Recommendations for Other Services       Functional Status Assessment Patient has had a recent decline in their functional status and demonstrates the ability to make significant improvements in function in a reasonable and predictable amount of time.     Precautions / Restrictions Precautions Precautions: Fall Restrictions Weight Bearing Restrictions: No      Mobility  Bed Mobility Overal bed mobility: Independent                  Transfers Overall transfer level: Independent Equipment used: None                    Ambulation/Gait Ambulation/Gait assistance: Modified independent (Device/Increase time) Gait Distance (Feet): 380 Feet Assistive device: None Gait Pattern/deviations: Step-through  pattern, Decreased stride length Gait velocity: decreased     General Gait Details: Slow and steady pace, decreased bilateral stride length  Stairs            Wheelchair Mobility    Modified Rankin (Stroke Patients Only)       Balance                                             Pertinent Vitals/Pain Pain Assessment Pain Assessment: 0-10 Pain Score: 10-Worst pain ever Pain Location: stomach Pain Descriptors / Indicators: Discomfort Pain Intervention(s): Limited activity within patient's tolerance, Monitored during session    Home Living Family/patient expects to be discharged to:: Shelter/Homeless                   Additional Comments: Pt says he sleeps on the ground and utilizes [facility] resources for bathing and clothes washing when able.    Prior Function Prior Level of Function : Independent/Modified Independent                     Hand Dominance   Dominant Hand: Right    Extremity/Trunk Assessment   Upper Extremity Assessment Upper Extremity Assessment: Overall WFL for tasks assessed    Lower Extremity Assessment Lower Extremity Assessment: Overall WFL for tasks assessed    Cervical / Trunk Assessment Cervical / Trunk Assessment: Normal  Communication   Communication: No difficulties  Cognition Arousal/Alertness: Awake/alert Behavior During Therapy: WFL for tasks assessed/performed Overall Cognitive Status:  Within Functional Limits for tasks assessed                                          General Comments      Exercises     Assessment/Plan    PT Assessment Patient does not need any further PT services  PT Problem List         PT Treatment Interventions      PT Goals (Current goals can be found in the Care Plan section)  Acute Rehab PT Goals Patient Stated Goal: less pain PT Goal Formulation: All assessment and education complete, DC therapy    Frequency       Co-evaluation                AM-PAC PT "6 Clicks" Mobility  Outcome Measure Help needed turning from your back to your side while in a flat bed without using bedrails?: None Help needed moving from lying on your back to sitting on the side of a flat bed without using bedrails?: None Help needed moving to and from a bed to a chair (including a wheelchair)?: None Help needed standing up from a chair using your arms (e.g., wheelchair or bedside chair)?: None Help needed to walk in hospital room?: None Help needed climbing 3-5 steps with a railing? : A Little 6 Click Score: 23    End of Session   Activity Tolerance: Patient tolerated treatment well Patient left: in bed;with call bell/phone within reach   PT Visit Diagnosis: Difficulty in walking, not elsewhere classified (R26.2);Pain Pain - part of body:  (abdomen)    Time: 3818-2993 PT Time Calculation (min) (ACUTE ONLY): 22 min   Charges:   PT Evaluation $PT Eval Low Complexity: 1 Low          Lillia Pauls, PT, DPT Acute Rehabilitation Services Office [phone]   Norval Morton 04/26/2022, 4:32 PM

## 2022-04-27 LAB — CBC WITH DIFFERENTIAL/PLATELET
Abs Immature Granulocytes: 0.02 10*3/uL (ref 0.00–0.07)
Basophils Absolute: 0.1 10*3/uL (ref 0.0–0.1)
Basophils Relative: 1 %
Eosinophils Absolute: 0.1 10*3/uL (ref 0.0–0.5)
Eosinophils Relative: 2 %
HCT: 27.7 % — ABNORMAL LOW (ref 39.0–52.0)
Hemoglobin: 9.5 g/dL — ABNORMAL LOW (ref 13.0–17.0)
Immature Granulocytes: 0 %
Lymphocytes Relative: 24 %
Lymphs Abs: 1.1 10*3/uL (ref 0.7–4.0)
MCH: 33.9 pg (ref 26.0–34.0)
MCHC: 34.3 g/dL (ref 30.0–36.0)
MCV: 98.9 fL (ref 80.0–100.0)
Monocytes Absolute: 0.6 10*3/uL (ref 0.1–1.0)
Monocytes Relative: 12 %
Neutro Abs: 2.8 10*3/uL (ref 1.7–7.7)
Neutrophils Relative %: 61 %
Platelets: 482 10*3/uL — ABNORMAL HIGH (ref 150–400)
RBC: 2.8 MIL/uL — ABNORMAL LOW (ref 4.22–5.81)
RDW: 13.2 % (ref 11.5–15.5)
WBC: 4.7 10*3/uL (ref 4.0–10.5)
nRBC: 0 % (ref 0.0–0.2)

## 2022-04-27 LAB — COMPREHENSIVE METABOLIC PANEL
ALT: 18 U/L (ref 0–44)
AST: 25 U/L (ref 15–41)
Albumin: 2.9 g/dL — ABNORMAL LOW (ref 3.5–5.0)
Alkaline Phosphatase: 36 U/L — ABNORMAL LOW (ref 38–126)
Anion gap: 7 (ref 5–15)
BUN: <5 mg/dL — ABNORMAL LOW (ref 6–20)
CO2: 29 mmol/L (ref 22–32)
Calcium: 8.6 mg/dL — ABNORMAL LOW (ref 8.9–10.3)
Chloride: 97 mmol/L — ABNORMAL LOW (ref 98–111)
Creatinine, Ser: 0.69 mg/dL (ref 0.61–1.24)
GFR, Estimated: >60 mL/min (ref >60)
Glucose, Bld: 99 mg/dL (ref 70–99)
Potassium: 3.1 mmol/L — ABNORMAL LOW (ref 3.5–5.1)
Sodium: 133 mmol/L — ABNORMAL LOW (ref 135–145)
Total Bilirubin: 0.8 mg/dL (ref 0.3–1.2)
Total Protein: 5.4 g/dL — ABNORMAL LOW (ref 6.5–8.1)

## 2022-04-27 LAB — LIPASE, BLOOD: Lipase: 786 U/L — ABNORMAL HIGH (ref 11–51)

## 2022-04-27 MED ORDER — POTASSIUM CHLORIDE CRYS ER 20 MEQ PO TBCR
40.0000 meq | EXTENDED_RELEASE_TABLET | Freq: Two times a day (BID) | ORAL | Status: AC
  Administered 2022-04-27 (×2): 40 meq via ORAL

## 2022-04-27 MED ORDER — LEVETIRACETAM 500 MG PO TABS
500.0000 mg | ORAL_TABLET | Freq: Two times a day (BID) | ORAL | Status: DC
  Administered 2022-04-27 – 2022-05-03 (×12): 500 mg via ORAL

## 2022-04-27 MED ORDER — PANTOPRAZOLE SODIUM 40 MG PO TBEC
40.0000 mg | DELAYED_RELEASE_TABLET | Freq: Two times a day (BID) | ORAL | Status: DC
  Administered 2022-04-27 – 2022-05-03 (×13): 40 mg via ORAL

## 2022-04-27 MED ORDER — GABAPENTIN 300 MG PO CAPS
300.0000 mg | ORAL_CAPSULE | Freq: Three times a day (TID) | ORAL | Status: DC
  Administered 2022-04-27 – 2022-05-03 (×18): 300 mg via ORAL

## 2022-04-27 NOTE — Progress Notes (Signed)
  Transition of Care [facility]) Screening Note   Patient Details  Name: Gary [name] Date of Birth: 1986-03-03   Transition of Care [facility]) CM/SW Contact:    Mearl Latin, LCSW Phone Number: 04/27/2022, 9:43 AM    Transition of Care Department [facility]) has reviewed patient. Patient recently left AMA on 04/23/22 with a history of homelessness and substance use. Resources were provided. Patient likely to require a buss pass at discharge. We will continue to monitor patient advancement through interdisciplinary progression rounds. If new patient transition needs arise, please place a TOC consult.

## 2022-04-27 NOTE — Progress Notes (Signed)
[facility]                                                                               Gary [name], is a 36 y.o. male, DOB - [birth year], EGB:[id number] Admit date - 04/25/2022    Outpatient Primary MD for the patient is Pcp, No  LOS - 2  days    Brief summary   36 year old gentleman with prior history of HIV with poor adherence to medication, seizure disorder, alcohol, tobacco abuse medical noncompliance homelessness, history of alcohol pancreatitis admitted on the April 16, 2022 for alcohol withdrawal and acute alcohol pancreatitis and left AMA on June 15, presents today with persistent nausea vomiting and abdominal pain.  Patient reports his last drink was on Saturday morning. Of note there was questionable mass in the third portion of the duodenum/marked thickened ascending colon on CT scan post EGD with enteroscopy did not show a mass probably possible pseudocyst/pancreatic collection compressing the duodenum.  Repeat CT scan of the abdomen and pelvis without contrast this admission shows significant decrease in the peripancreatic collection. Patient was found on the side of the road with intractable nausea vomiting and severe diffuse abdominal pain and was picked up by EMS.  Further work-up showed acute pancreatitis with lipase levels greater than 800.  He was admitted for IV fluids IV antiemetics and pain control.   Assessment & Plan    Assessment and Plan:  Acute alcohol pancreatitis secondary to continuous alcohol abuse On admission patient lipase levels greater than 800 improved to 786 Noncontrast CT showing slight decrease in the size of the fluid collection when compared to the previous CT. Interval distention of the stomach suggestive for degree of obstruction at the level of duodenum. Continue with IV fluids, aggressive hydration , IV antiemetics and IV pain control. Patient was started on clear liquid diet for now. Continue to follow lipase levels, will  slowly advance diet as abdominal pain improves.     Severe dehydration secondary to poor oral intake with intractable nausea and vomiting Recommend to continue with IV fluids and IV antiemetics. Improving.    AKI secondary to severe dehydration This continue with IV fluids, creatinine appears to be at baseline.    Hypokalemia Replaced, recheck in am.  Check magnesium levels.     Mild hyponatremia probably secondary to alcohol intoxication. Improving.   Severe protein calorie malnutrition We will request dietary consult.    Alcohol withdrawal in the setting of persistent and severe alcohol abuse Patient was started on CIWA protocol Continue with multivitamin folic acid and thiamine supplements.  Patient will benefit from inpatient alcohol rehab.    Prolonged QTc Avoid QTc prolonging medications, keep magnesium greater than 2 and potassium greater than 4. Check EKG in am.     Elevated liver chemistries in the setting of alcohol abuse Continue to monitor.   Tobacco use disorder Nicotine patch ordered.    HIV with medication nonadherence  resume Biktarvy when clinically improved   Anemia of chronic disease:  Hemoglobin improved from 8 to 9.5 and stable.    Mild thrombocytosis:  Reactive from acute pancreatitis.     Estimated body mass index is 21.08 kg/m  as calculated from the following:   Height as of 04/16/22: 6' (1.829 m).   Weight as of 04/16/22: 70.5 kg.  Code Status: Full code DVT Prophylaxis:  enoxaparin (LOVENOX) injection 40 mg Start: 04/25/22 2200   Level of Care: Level of care: Telemetry Medical Family Communication: None at bedside  Disposition Plan:     Remains inpatient appropriate: IV fluids and IV pain control and CIWA protocol for alcohol withdrawal  Procedures:   None  Antimicrobials:   Anti-infectives (From admission, onward)    None        Medications  Scheduled Meds:  enoxaparin (LOVENOX) injection  40 mg  Subcutaneous Q24H   folic acid  1 mg Oral Daily   gabapentin  300 mg Oral TID   levETIRAcetam  500 mg Oral BID   LORazepam  0-4 mg Intravenous Q6H   Followed by   LORazepam  0-4 mg Intravenous Q12H   multivitamin with minerals  1 tablet Oral Daily   pantoprazole  40 mg Oral BID   potassium chloride  40 mEq Oral BID   thiamine  100 mg Oral Daily   Or   thiamine  100 mg Intravenous Daily   Continuous Infusions:  lactated ringers 125 mL/hr at 04/27/22 0906   PRN Meds:.acetaminophen, HYDROmorphone (DILAUDID) injection, LORazepam, LORazepam **OR** LORazepam, oxyCODONE, polyethylene glycol, prochlorperazine    Subjective:   Gary [name] was seen and examined today. Pt requesting the telemetry to be discontinued.  Abd pain is improving with pain meds and on clear liquid diet.   Objective:   Vitals:   04/27/22 0012 04/27/22 0334 04/27/22 0818 04/27/22 1200  BP:  99/64 (!) 91/52 109/75  Pulse:   69   Resp: 15 14 15 14   Temp:  98.1 F (36.7 C) 97.9 F (36.6 C) 97.8 F (36.6 C)  TempSrc:  Oral Oral Oral  SpO2:  93%      Intake/Output Summary (Last 24 hours) at 04/27/2022 1248 Last data filed at 04/27/2022 0700 Gross per 24 hour  Intake 2553.19 ml  Output 1400 ml  Net 1153.19 ml    There were no vitals filed for this visit.   Exam General exam: Appears calm and comfortable  Respiratory system: Clear to auscultation. Respiratory effort normal. Cardiovascular system: S1 & S2 heard, RRR. No JVD, No pedal edema. Gastrointestinal system: Abdomen is soft, tender generalized, Normal bowel sounds heard. Central nervous system: Alert and oriented. No focal neurological deficits. Extremities: Symmetric 5 x 5 power. Skin: No rashes, lesions or ulcers Psychiatry: restless, not in distress.    Data Reviewed:  I have personally reviewed following labs and imaging studies   CBC Lab Results  Component Value Date   WBC 4.7 04/27/2022   RBC 2.80 (L) 04/27/2022   HGB 9.5 (L)  04/27/2022   HCT 27.7 (L) 04/27/2022   MCV 98.9 04/27/2022   MCH 33.9 04/27/2022   PLT 482 (H) 04/27/2022   MCHC 34.3 04/27/2022   RDW 13.2 04/27/2022   LYMPHSABS 1.1 04/27/2022   MONOABS 0.6 04/27/2022   EOSABS 0.1 04/27/2022   BASOSABS 0.1 04/27/2022     Last metabolic panel Lab Results  Component Value Date   NA 133 (L) 04/27/2022   K 3.1 (L) 04/27/2022   CL 97 (L) 04/27/2022   CO2 29 04/27/2022   BUN <5 (L) 04/27/2022   CREATININE 0.69 04/27/2022   GLUCOSE 99 04/27/2022   GFRNONAA >60 04/27/2022   GFRAA 124 03/20/2021   CALCIUM 8.6 (L) 04/27/2022  PHOS 3.7 04/26/2022   PROT 5.4 (L) 04/27/2022   ALBUMIN 2.9 (L) 04/27/2022   BILITOT 0.8 04/27/2022   ALKPHOS 36 (L) 04/27/2022   AST 25 04/27/2022   ALT 18 04/27/2022   ANIONGAP 7 04/27/2022    CBG (last 3)  No results for input(s): "GLUCAP" in the last 72 hours.    Coagulation Profile: No results for input(s): "INR", "PROTIME" in the last 168 hours.   Radiology Studies: CT ABDOMEN PELVIS WO CONTRAST  Result Date: 04/25/2022 CLINICAL DATA:  Concern for acute pancreatitis. EXAM: CT ABDOMEN AND PELVIS WITHOUT CONTRAST TECHNIQUE: Multidetector CT imaging of the abdomen and pelvis was performed following the standard protocol without IV contrast. RADIATION DOSE REDUCTION: This exam was performed according to the departmental dose-optimization program which includes automated exposure control, adjustment of the mA and/or kV according to patient size and/or use of iterative reconstruction technique. COMPARISON:  CT abdomen pelvis dated 04/23/2022. FINDINGS: Evaluation of this exam is limited in the absence of intravenous contrast. Lower chest: There is mild eventration of the left hemidiaphragm the left lung base subsegmental atelectasis. The visualized right lung base is clear. No intra-abdominal free air. Small high attenuating/hemorrhagic fluid within the pelvis. Hepatobiliary: Fatty liver. No intrahepatic biliary  dilatation. The gallbladder is unremarkable. Pancreas: The body and tail of the pancreas appear unremarkable. Fluid collection abutting the head of the pancreas appears slightly smaller. Spleen: Normal in size without focal abnormality. Adrenals/Urinary Tract: The adrenal glands unremarkable. The kidneys, visualized ureters, and urinary bladder appear unremarkable. Stomach/Bowel: The stomach is moderately distended with air and fluid. Similar or slightly decreased size of the fluid collection abutting the duodenal C-loop and head of the pancreas compared to prior CT. Findings may represent hemorrhagic pancreatitis or duodenal hemorrhage secondary to trauma or coagulopathy. Underlying ulcer or mass is not excluded. Clinical correlation is recommended. No evidence of small-bowel obstruction. The appendix is normal. Vascular/Lymphatic: The abdominal aorta and IVC unremarkable. No portal venous gas. There is no adenopathy. Reproductive: The prostate and seminal vesicles are grossly unremarkable. No pelvic mass. Other: None Musculoskeletal: No acute or significant osseous findings. IMPRESSION: 1. Overall slight interval decrease in the size of the fluid collection abutting the head of the pancreas compared to prior CT. Findings in keeping with provided history of pancreatitis. Areas of higher attenuation within this collection suggest hemorrhage. 2. Interval distension of the stomach suggestive of a degree of obstruction at the level of the duodenum. 3. Fatty liver. Electronically Signed   By: Elgie Collard M.D.   On: 04/25/2022 19:32   DG Chest Portable 1 View  Result Date: 04/25/2022 CLINICAL DATA:  Altered mental status. EXAM: PORTABLE CHEST 1 VIEW COMPARISON:  One-view chest x-ray 04/16/2022 FINDINGS: Heart size is normal. Chronic elevation of left hemidiaphragm noted. Lungs are clear. Axial skeleton is unremarkable. IMPRESSION: No acute cardiopulmonary disease. Electronically Signed   By: Marin Roberts  M.D.   On: 04/25/2022 19:01       Kathlen Mody M.D. [facility] 04/27/2022, 12:48 PM  Available via Epic secure chat 7am-7pm After 7 pm, please refer to night coverage provider listed on amion.

## 2022-04-28 LAB — COMPREHENSIVE METABOLIC PANEL
ALT: 21 U/L (ref 0–44)
AST: 25 U/L (ref 15–41)
Albumin: 2.8 g/dL — ABNORMAL LOW (ref 3.5–5.0)
Alkaline Phosphatase: 33 U/L — ABNORMAL LOW (ref 38–126)
Anion gap: 6 (ref 5–15)
BUN: <5 mg/dL — ABNORMAL LOW (ref 6–20)
CO2: 27 mmol/L (ref 22–32)
Calcium: 9 mg/dL (ref 8.9–10.3)
Chloride: 104 mmol/L (ref 98–111)
Creatinine, Ser: 0.65 mg/dL (ref 0.61–1.24)
GFR, Estimated: >60 mL/min (ref >60)
Glucose, Bld: 98 mg/dL (ref 70–99)
Potassium: 3.9 mmol/L (ref 3.5–5.1)
Sodium: 137 mmol/L (ref 135–145)
Total Bilirubin: 0.7 mg/dL (ref 0.3–1.2)
Total Protein: 5.9 g/dL — ABNORMAL LOW (ref 6.5–8.1)

## 2022-04-28 LAB — CBC WITH DIFFERENTIAL/PLATELET
Abs Immature Granulocytes: 0.01 10*3/uL (ref 0.00–0.07)
Basophils Absolute: 0 10*3/uL (ref 0.0–0.1)
Basophils Relative: 1 %
Eosinophils Absolute: 0.2 10*3/uL (ref 0.0–0.5)
Eosinophils Relative: 4 %
HCT: 33 % — ABNORMAL LOW (ref 39.0–52.0)
Hemoglobin: 11 g/dL — ABNORMAL LOW (ref 13.0–17.0)
Immature Granulocytes: 0 %
Lymphocytes Relative: 31 %
Lymphs Abs: 1.2 10*3/uL (ref 0.7–4.0)
MCH: 33.6 pg (ref 26.0–34.0)
MCHC: 33.3 g/dL (ref 30.0–36.0)
MCV: 100.9 fL — ABNORMAL HIGH (ref 80.0–100.0)
Monocytes Absolute: 0.5 10*3/uL (ref 0.1–1.0)
Monocytes Relative: 12 %
Neutro Abs: 2.1 10*3/uL (ref 1.7–7.7)
Neutrophils Relative %: 52 %
Platelets: 444 10*3/uL — ABNORMAL HIGH (ref 150–400)
RBC: 3.27 MIL/uL — ABNORMAL LOW (ref 4.22–5.81)
RDW: 13.2 % (ref 11.5–15.5)
WBC: 4 10*3/uL (ref 4.0–10.5)
nRBC: 0 % (ref 0.0–0.2)

## 2022-04-28 LAB — LIPASE, BLOOD: Lipase: 298 U/L — ABNORMAL HIGH (ref 11–51)

## 2022-04-28 LAB — PATHOLOGIST SMEAR REVIEW

## 2022-04-28 MED ORDER — LACTATED RINGERS IV SOLN: INTRAVENOUS | Status: DC

## 2022-04-28 NOTE — Progress Notes (Signed)
Initial Nutrition Assessment  DOCUMENTATION CODES:   Not applicable  INTERVENTION:   Multivitamin w/ minerals daily Vital Cuisine Shake BID, each supplement provides 520 kcal and 22 grams of protein Advance diet at medically appropriate  Recommend obtaining new weight.   NUTRITION DIAGNOSIS:   Increased nutrient needs related to chronic illness as evidenced by estimated needs.  GOAL:   Patient will meet greater than or equal to 90% of their needs  MONITOR:   PO intake, Supplement acceptance, Diet advancement, Labs, Weight trends  REASON FOR ASSESSMENT:   Consult Assessment of nutrition requirement/status  ASSESSMENT:   36 y.o. male presented to the ED with complaints of abdominal pain and intractable nausea and vomiting. Pt left AMA 2 days prior to return to ED, admitted with alcohol pancreatitis and alcohol withdrawal. PMH includes MDD, EtOH abuse, HIV, polysubstance abuse, and malnutrition. Pt admitted with acute alcohol pancreatitis and severe dehydration.   Pt reports that he had not had any nausea or vomiting today and he tolerated the clear liquids this morning. States that he had minimal N/V yesterday, but the day prior (when he returned to the ED) he was throwing up a lot.  Discussed that pt was advanced to full liquid diet, reviewed what foods that he would be able to have. Reviewed that RD will continue to follow for diet advancements. Discussed ONS with pt due to ongoing poor PO intake. Pt agreeable to on tray.   Pt with no weight this admission, reached out to RN to obtain weight.   Medications reviewed and include: Folic acid, MVI, Protonix, Thiamine Labs reviewed: BUN <5   NUTRITION - FOCUSED PHYSICAL EXAM:  Deferred to follow-up.   Diet Order:   Diet Order             Diet full liquid Room service appropriate? Yes; Fluid consistency: Thin  Diet effective now                   EDUCATION NEEDS:   No education needs have been identified at  this time  Skin:  Skin Assessment: Reviewed RN Assessment  Last BM:  Unknown  Height:   Ht Readings from Last 1 Encounters:  04/16/22 6' (1.829 m)    Weight:   Wt Readings from Last 1 Encounters:  04/16/22 70.5 kg    Ideal Body Weight:  80.9 kg  BMI:  There is no height or weight on file to calculate BMI.  Estimated Nutritional Needs:   Kcal:  2400-2600  Protein:  120-140 grams  Fluid:  >/= 2 L    Kirby Crigler RD, LDN Clinical Dietitian See [facility] for contact information.

## 2022-04-28 NOTE — Progress Notes (Signed)
[facility]                                                                               Gary [name], is a 36 y.o. male, DOB - 06/20/1986, YQM:[id number] Admit date - 04/25/2022    Outpatient Primary MD for the patient is Pcp, No  LOS - 3  days    Brief summary   36 year old gentleman with prior history of HIV with poor adherence to medication, seizure disorder, alcohol, tobacco abuse medical noncompliance homelessness, history of alcohol pancreatitis admitted on the April 16, 2022 for alcohol withdrawal and acute alcohol pancreatitis and left AMA on June 15, presents today with persistent nausea vomiting and abdominal pain.  Patient reports his last drink was on Saturday morning. Of note there was questionable mass in the third portion of the duodenum/marked thickened ascending colon on CT scan post EGD with enteroscopy did not show a mass probably possible pseudocyst/pancreatic collection compressing the duodenum.  Repeat CT scan of the abdomen and pelvis without contrast this admission shows significant decrease in the peripancreatic collection. Patient was found on the side of the road with intractable nausea vomiting and severe diffuse abdominal pain and was picked up by EMS.  Further work-up showed acute pancreatitis with lipase levels greater than 800.  He was admitted for IV fluids IV antiemetics and pain control.   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 Interval distention of the stomach suggestive for degree of obstruction at the level of duodenum. Continue with IV fluids, aggressive hydration , IV antiemetics and IV pain control. Patient was started on clear liquid diet , advanced to full liquid diet.  Continue to follow lipase levels,  will slowly advance diet . Abd pain improved with current pain regimen.  Lipase improved to 298.    Severe dehydration secondary to poor oral intake with intractable nausea and vomiting Recommend to continue with IV fluids and IV antiemetics. Improving.    AKI secondary to severe dehydration  continue with IV fluids, creatinine appears to have improved with IV fluids and is back to baseline.     Hypokalemia Replaced.  Repeat levels wnl.    Mild hyponatremia probably secondary to alcohol intoxication. Resolved.   Severe protein calorie malnutrition Dietary consulted.    Alcohol withdrawal in the setting of persistent and severe alcohol abuse Patient was started on CIWA protocol Continue with multivitamin folic acid and thiamine supplements.  Patient will benefit from inpatient alcohol rehab.    Prolonged QTc Avoid QTc prolonging medications, keep magnesium greater than 2 and potassium greater than 4.     Elevated liver chemistries in the setting of alcohol abuse Continue to monitor.   Tobacco use disorder Nicotine patch ordered.    HIV with medication nonadherence  resume Biktarvy when clinically improved   Anemia of chronic disease:  Hemoglobin improving . Continue to monitor.    Mild thrombocytosis:  Reactive from acute pancreatitis.     Estimated body  mass index is 21.08 kg/m as calculated from the following:   Height as of 04/16/22: 6' (1.829 m).   Weight as of 04/16/22: 70.5 kg.  Code Status: Full code DVT Prophylaxis:  enoxaparin (LOVENOX) injection 40 mg Start: 04/25/22 2200   Level of Care: Level of care: Telemetry Medical Family Communication: None at bedside  Disposition Plan:     Remains inpatient appropriate: IV fluids and IV pain control and CIWA protocol for alcohol withdrawal  Procedures:   None  Antimicrobials:   Anti-infectives (From admission, onward)    None        Medications  Scheduled Meds:  enoxaparin  (LOVENOX) injection  40 mg Subcutaneous Q24H   folic acid  1 mg Oral Daily   gabapentin  300 mg Oral TID   levETIRAcetam  500 mg Oral BID   LORazepam  0-4 mg Intravenous Q12H   multivitamin with minerals  1 tablet Oral Daily   pantoprazole  40 mg Oral BID   thiamine  100 mg Oral Daily   Or   thiamine  100 mg Intravenous Daily   Continuous Infusions:   PRN Meds:.acetaminophen, HYDROmorphone (DILAUDID) injection, LORazepam, LORazepam **OR** LORazepam, oxyCODONE, polyethylene glycol, prochlorperazine    Subjective:   Gary [name] was seen and examined today. Requesting pain meds.  No nausea or vomiting.  No BM yet.  Wants diet to be advanced.  No chest pain or sob.  Abd pain slowly improving.   Objective:   Vitals:   04/27/22 2320 04/28/22 0400 04/28/22 0502 04/28/22 0758  BP: 101/62 (!) 85/54 101/61 (!) 113/59  Pulse: 70 60 70 (!) 59  Resp: 15 16 15 19   Temp: 97.8 F (36.6 C) 98.2 F (36.8 C)  98.3 F (36.8 C)  TempSrc: Oral Oral  Oral  SpO2:    90%    Intake/Output Summary (Last 24 hours) at 04/28/2022 1051 Last data filed at 04/28/2022 0600 Gross per 24 hour  Intake 120 ml  Output 3650 ml  Net -3530 ml    There were no vitals filed for this visit.   Exam General exam: Appears calm and comfortable  Respiratory system: Clear to auscultation. Respiratory effort normal. Cardiovascular system: S1 & S2 heard, RRR. 04/30/2022 No pedal edema. Gastrointestinal system: Abdomen is soft, with generalized tenderness, no rebound. Improving. Normal bowel sounds heard. Central nervous system: Alert and oriented. No focal neurological deficits. Extremities: Symmetric 5 x 5 power. Skin: No rashes, lesions or ulcers Psychiatry:Mood & affect appropriate.     Data Reviewed:  I have personally reviewed following labs and imaging studies   CBC Lab Results  Component Value Date   WBC 4.0 04/28/2022   RBC 3.27 (L) 04/28/2022   HGB 11.0 (L) 04/28/2022   HCT 33.0 (L) 04/28/2022    MCV 100.9 (H) 04/28/2022   MCH 33.6 04/28/2022   PLT 444 (H) 04/28/2022   MCHC 33.3 04/28/2022   RDW 13.2 04/28/2022   LYMPHSABS 1.2 04/28/2022   MONOABS 0.5 04/28/2022   EOSABS 0.2 04/28/2022   BASOSABS 0.0 04/28/2022     Last metabolic panel Lab Results  Component Value Date   NA 137 04/28/2022   K 3.9 04/28/2022   CL 104 04/28/2022   CO2 27 04/28/2022   BUN <5 (L) 04/28/2022   CREATININE 0.65 04/28/2022   GLUCOSE 98 04/28/2022   GFRNONAA >60 04/28/2022   GFRAA 124 03/20/2021   CALCIUM 9.0 04/28/2022   PHOS 3.7 04/26/2022   PROT 5.9 (L) 04/28/2022  ALBUMIN 2.8 (L) 04/28/2022   BILITOT 0.7 04/28/2022   ALKPHOS 33 (L) 04/28/2022   AST 25 04/28/2022   ALT 21 04/28/2022   ANIONGAP 6 04/28/2022    CBG (last 3)  No results for input(s): "GLUCAP" in the last 72 hours.    Coagulation Profile: No results for input(s): "INR", "PROTIME" in the last 168 hours.   Radiology Studies: No results found.     Kathlen Mody M.D. [facility] 04/28/2022, 10:51 AM  Available via Epic secure chat 7am-7pm After 7 pm, please refer to night coverage provider listed on amion.

## 2022-04-29 LAB — CBC WITH DIFFERENTIAL/PLATELET
Abs Immature Granulocytes: 0.01 10*3/uL (ref 0.00–0.07)
Basophils Absolute: 0 10*3/uL (ref 0.0–0.1)
Basophils Relative: 1 %
Eosinophils Absolute: 0.1 10*3/uL (ref 0.0–0.5)
Eosinophils Relative: 3 %
HCT: 32.1 % — ABNORMAL LOW (ref 39.0–52.0)
Hemoglobin: 10.4 g/dL — ABNORMAL LOW (ref 13.0–17.0)
Immature Granulocytes: 0 %
Lymphocytes Relative: 25 %
Lymphs Abs: 1 10*3/uL (ref 0.7–4.0)
MCH: 33.1 pg (ref 26.0–34.0)
MCHC: 32.4 g/dL (ref 30.0–36.0)
MCV: 102.2 fL — ABNORMAL HIGH (ref 80.0–100.0)
Monocytes Absolute: 0.3 10*3/uL (ref 0.1–1.0)
Monocytes Relative: 7 %
Neutro Abs: 2.7 10*3/uL (ref 1.7–7.7)
Neutrophils Relative %: 64 %
Platelets: 459 10*3/uL — ABNORMAL HIGH (ref 150–400)
RBC: 3.14 MIL/uL — ABNORMAL LOW (ref 4.22–5.81)
RDW: 13.3 % (ref 11.5–15.5)
WBC: 4.2 10*3/uL (ref 4.0–10.5)
nRBC: 0 % (ref 0.0–0.2)

## 2022-04-29 LAB — COMPREHENSIVE METABOLIC PANEL
ALT: 19 U/L (ref 0–44)
AST: 22 U/L (ref 15–41)
Albumin: 2.9 g/dL — ABNORMAL LOW (ref 3.5–5.0)
Alkaline Phosphatase: 37 U/L — ABNORMAL LOW (ref 38–126)
Anion gap: 4 — ABNORMAL LOW (ref 5–15)
BUN: <5 mg/dL — ABNORMAL LOW (ref 6–20)
CO2: 29 mmol/L (ref 22–32)
Calcium: 9.2 mg/dL (ref 8.9–10.3)
Chloride: 104 mmol/L (ref 98–111)
Creatinine, Ser: 0.66 mg/dL (ref 0.61–1.24)
GFR, Estimated: >60 mL/min (ref >60)
Glucose, Bld: 97 mg/dL (ref 70–99)
Potassium: 4 mmol/L (ref 3.5–5.1)
Sodium: 137 mmol/L (ref 135–145)
Total Bilirubin: 0.5 mg/dL (ref 0.3–1.2)
Total Protein: 5.5 g/dL — ABNORMAL LOW (ref 6.5–8.1)

## 2022-04-29 LAB — LIPASE, BLOOD: Lipase: 397 U/L — ABNORMAL HIGH (ref 11–51)

## 2022-04-29 MED ORDER — BICTEGRAVIR-EMTRICITAB-TENOFOV 50-200-25 MG PO TABS
1.0000 | ORAL_TABLET | Freq: Every day | ORAL | Status: DC
  Administered 2022-04-29 – 2022-05-03 (×5): 1 via ORAL
  Filled 2022-04-29 (×5): qty 1

## 2022-04-29 NOTE — Progress Notes (Signed)
[facility] night cross cover note:   I was notified by RN that telemetry demonstrated a 5 beat run of nonsustained V. tach around 545 this morning.  Patient appears to be asymptomatic, sleeping at the time.  Vital signs stable.  No new orders from my standpoint at this time.    Newton Pigg, DO Hospitalist

## 2022-04-29 NOTE — Progress Notes (Signed)
[facility]                                                                               [name], is a 36 y.o. male, DOB - [birth year], ZOX:[id number] Admit date - 04/25/2022    Outpatient Primary MD for the patient is Pcp, No  LOS - 4  days    Brief summary   36 year old gentleman with prior history of HIV with poor adherence to medication, seizure disorder, alcohol, tobacco abuse medical noncompliance homelessness, history of alcohol pancreatitis admitted on the April 16, 2022 for alcohol withdrawal and acute alcohol pancreatitis and left AMA on June 15, presents today with persistent nausea vomiting and abdominal pain.  Patient reports his last drink was on Saturday morning. Of note there was questionable mass in the third portion of the duodenum/marked thickened ascending colon on CT scan post EGD with enteroscopy did not show a mass probably possible pseudocyst/pancreatic collection compressing the duodenum.  Repeat CT scan of the abdomen and pelvis without contrast this admission shows significant decrease in the peripancreatic collection. Patient was found on the side of the road with intractable nausea vomiting and severe diffuse abdominal pain and was picked up by EMS.  Further work-up showed acute pancreatitis with lipase levels greater than 800.  He was admitted for IV fluids IV antiemetics and pain control. Pt seen and examined this am, reports pain is controlled with IV dilaudid and oral meds, and requesting to advance to soft diet.    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  Noncontrast CT showing slight decrease in the size of the fluid collection when compared to the previous CT. Interval distention of the stomach suggestive for degree of obstruction at the level of duodenum. Continue with IV fluids, aggressive  hydration , IV antiemetics and IV pain control. Patient was started on clear liquid diet , advanced to full liquid diet to soft diet.   Continue to follow lipase levels, will slowly advance diet . Abd pain improved with current pain regimen.      Severe dehydration secondary to poor oral intake with intractable nausea and vomiting Much improved.  No nausea, or vomiting today.    AKI secondary to severe dehydration Resolved with IV fluids.      Hypokalemia Replaced.  Repeat levels wnl.    Mild hyponatremia probably secondary to alcohol intoxication. Resolved.   Severe protein calorie malnutrition Dietary consulted.    Alcohol withdrawal in the setting of persistent and severe alcohol abuse Patient was started on CIWA protocol Continue with multivitamin folic acid and thiamine supplements.  Patient will benefit from inpatient alcohol rehab.    Prolonged QTc Avoid QTc prolonging medications, keep magnesium greater than 2 and potassium greater than 4. EKG shows QTc improved.     Elevated liver chemistries in the setting of alcohol abuse Continue to monitor.   Tobacco use disorder Nicotine patch ordered.    HIV with medication nonadherence  Restarted Biktarvy.    Anemia of chronic disease:  Hemoglobin stable around 10.  Mild thrombocytosis:  Reactive from acute pancreatitis.  Interventions: MVI, Hormel Shake  Estimated body mass index is 20.6 kg/m as calculated from the following:   Height as of 04/16/22: 6' (1.829 m).   Weight as of this encounter: 68.9 kg.  Code Status: Full code DVT Prophylaxis:  enoxaparin (LOVENOX) injection 40 mg Start: 04/25/22 2200   Level of Care: Level of care: Telemetry Medical Family Communication: None at bedside  Disposition Plan:     Remains inpatient appropriate: IV fluids and IV pain control and CIWA protocol for alcohol withdrawal  Procedures:  None  Antimicrobials:   Anti-infectives (From admission,  onward)    Start     Dose/Rate Route Frequency Ordered Stop   04/29/22 1045  bictegravir-emtricitabine-tenofovir AF (BIKTARVY) 50-200-25 MG per tablet 1 tablet        1 tablet Oral Daily 04/29/22 0947          Medications  Scheduled Meds:  bictegravir-emtricitabine-tenofovir AF  1 tablet Oral Daily   enoxaparin (LOVENOX) injection  40 mg Subcutaneous Q24H   folic acid  1 mg Oral Daily   gabapentin  300 mg Oral TID   levETIRAcetam  500 mg Oral BID   multivitamin with minerals  1 tablet Oral Daily   pantoprazole  40 mg Oral BID   thiamine  100 mg Oral Daily   Or   thiamine  100 mg Intravenous Daily   Continuous Infusions:  lactated ringers 100 mL/hr at 04/29/22 0905    PRN Meds:.acetaminophen, HYDROmorphone (DILAUDID) injection, LORazepam, oxyCODONE, polyethylene glycol, prochlorperazine    Subjective:   Gary [name] was seen and examined today. He reports pain is controlled, but continues to request for IV dilaudid.  Requesting to advance diet.  No nausea or vomiting.  No BM yet.   Objective:   Vitals:   04/29/22 0609 04/29/22 0748 04/29/22 1206 04/29/22 1602  BP:  107/78 94/71 101/68  Pulse:  62 81 77  Resp: 14 15 15 12   Temp:  97.9 F (36.6 C) 98.9 F (37.2 C) 97.8 F (36.6 C)  TempSrc:  Oral Oral Oral  SpO2:  97% 100% 100%  Weight:        Intake/Output Summary (Last 24 hours) at 04/29/2022 1621 Last data filed at 04/29/2022 1028 Gross per 24 hour  Intake 2268.48 ml  Output 1550 ml  Net 718.48 ml    Filed Weights   04/28/22 1705  Weight: 68.9 kg     Exam General exam: cachetic looking gentleman with some withdrawals.  Respiratory system: Clear to auscultation. Respiratory effort normal. Cardiovascular system: S1 & S2 heard, RRR. No JVD, No pedal edema. Gastrointestinal system: Abdomen is soft, with mild tenderness.  Normal bowel sounds heard. Central nervous system: Alert and oriented. No focal neurological deficits. Extremities: Symmetric 5  x 5 power. Skin: No rashes, lesions or ulcers Psychiatry: Mood & affect appropriate.      Data Reviewed:  I have personally reviewed following labs and imaging studies   CBC Lab Results  Component Value Date   WBC 4.2 04/29/2022   RBC 3.14 (L) 04/29/2022   HGB 10.4 (L) 04/29/2022   HCT 32.1 (L) 04/29/2022   MCV 102.2 (H) 04/29/2022   MCH 33.1 04/29/2022   PLT 459 (H) 04/29/2022   MCHC 32.4 04/29/2022   RDW 13.3 04/29/2022   LYMPHSABS 1.0 04/29/2022   MONOABS 0.3 04/29/2022   EOSABS 0.1 04/29/2022   BASOSABS 0.0 04/29/2022     Last metabolic panel Lab Results  Component Value Date   NA 137 04/29/2022   K 4.0 04/29/2022   CL 104 04/29/2022   CO2 29 04/29/2022   BUN <5 (L) 04/29/2022   CREATININE 0.66 04/29/2022   GLUCOSE 97 04/29/2022   GFRNONAA >60 04/29/2022   GFRAA 124 03/20/2021   CALCIUM 9.2 04/29/2022   PHOS 3.7 04/26/2022   PROT 5.5 (L) 04/29/2022   ALBUMIN 2.9 (L) 04/29/2022   BILITOT 0.5 04/29/2022   ALKPHOS 37 (L) 04/29/2022   AST 22 04/29/2022   ALT 19 04/29/2022   ANIONGAP 4 (L) 04/29/2022    CBG (last 3)  No results for input(s): "GLUCAP" in the last 72 hours.    Coagulation Profile: No results for input(s): "INR", "PROTIME" in the last 168 hours.   Radiology Studies: No results found.     Kathlen Mody M.D. [facility] 04/29/2022, 4:21 PM  Available via Epic secure chat 7am-7pm After 7 pm, please refer to night coverage provider listed on amion.

## 2022-04-30 LAB — CULTURE, BLOOD (ROUTINE X 2)
Culture: NO GROWTH
Culture: NO GROWTH
Special Requests: ADEQUATE
Special Requests: ADEQUATE

## 2022-04-30 MED ORDER — SENNOSIDES-DOCUSATE SODIUM 8.6-50 MG PO TABS
1.0000 | ORAL_TABLET | Freq: Two times a day (BID) | ORAL | Status: DC
  Administered 2022-04-30 – 2022-05-02 (×5): 1 via ORAL

## 2022-04-30 NOTE — Progress Notes (Signed)
[facility]                                                                               Gary [name], is a 36 y.o. male, DOB - 08/20/1986, IWL:[id number] Admit date - 04/25/2022    Outpatient Primary MD for the patient is Pcp, No  LOS - 5  days    Brief summary   36 year old gentleman with prior history of HIV with poor adherence to medication, seizure disorder, alcohol, tobacco abuse medical noncompliance homelessness, history of alcohol pancreatitis admitted on the April 16, 2022 for alcohol withdrawal and acute alcohol pancreatitis and left AMA on June 15, presents today with persistent nausea vomiting and abdominal pain.  Patient reports his last drink was on Saturday morning. Of note there was questionable mass in the third portion of the duodenum/marked thickened ascending colon on CT scan post EGD with enteroscopy did not show a mass probably possible pseudocyst/pancreatic collection compressing the duodenum.  Repeat CT scan of the abdomen and pelvis without contrast this admission shows significant decrease in the peripancreatic collection. Patient was found on the side of the road with intractable nausea vomiting and severe diffuse abdominal pain and was picked up by EMS.  Further work-up showed acute pancreatitis with lipase levels greater than 800.  He was admitted for IV fluids IV antiemetics and pain control. Pt seen and examined today. He continues to require IV dilaudid in addition to oral meds.     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  Noncontrast CT showing slight decrease in the size of the fluid collection when compared to the previous CT. Interval distention of the stomach suggestive for degree of obstruction at the level of duodenum. Continue with IV fluids, aggressive hydration , IV antiemetics and IV pain  control. Patient was started on clear liquid diet , advanced to full liquid diet to soft diet.   Continue to follow lipase levels, will slowly advance diet . Abd pain improved with current pain regimen.  Pt currently on soft diet and able to tolerate, but sill requiring IV dilaudid. Repeat tlipase in am .      Severe dehydration secondary to poor oral intake with intractable nausea and vomiting Much improved.  Stop the IV fluids.    AKI secondary to severe dehydration Resolved with IV fluids.      Hypokalemia Replaced.  Repeat levels wnl.    Mild hyponatremia probably secondary to alcohol intoxication. Resolved.   Severe protein calorie malnutrition Dietary consulted.    Alcohol withdrawal in the setting of persistent and severe alcohol abuse Patient was started on CIWA protocol Continue with multivitamin folic acid and thiamine supplements.  Patient will benefit from inpatient alcohol rehab.    Prolonged QTc Avoid QTc prolonging medications, keep magnesium greater than 2 and potassium greater than 4. EKG shows QTc improved.     Elevated liver chemistries in the setting of alcohol abuse Continue to monitor. Check liver panel in am.    Tobacco use disorder Nicotine patch ordered.    HIV with medication  nonadherence  Restarted Biktarvy.    Anemia of chronic disease:  Hemoglobin stable around 10.    Mild thrombocytosis:  Reactive from acute pancreatitis.  Interventions: MVI, Hormel Shake  Estimated body mass index is 20.6 kg/m as calculated from the following:   Height as of 04/16/22: 6' (1.829 m).   Weight as of this encounter: 68.9 kg.  Code Status: Full code DVT Prophylaxis:  enoxaparin (LOVENOX) injection 40 mg Start: 04/25/22 2200   Level of Care: Level of care: Telemetry Medical Family Communication: None at bedside  Disposition Plan:     Remains inpatient appropriate: IV fluids and IV pain control and CIWA protocol for alcohol  withdrawal  Procedures:  None  Antimicrobials:   Anti-infectives (From admission, onward)    Start     Dose/Rate Route Frequency Ordered Stop   04/29/22 1045  bictegravir-emtricitabine-tenofovir AF (BIKTARVY) 50-200-25 MG per tablet 1 tablet        1 tablet Oral Daily 04/29/22 0947          Medications  Scheduled Meds:  bictegravir-emtricitabine-tenofovir AF  1 tablet Oral Daily   enoxaparin (LOVENOX) injection  40 mg Subcutaneous Q24H   folic acid  1 mg Oral Daily   gabapentin  300 mg Oral TID   levETIRAcetam  500 mg Oral BID   multivitamin with minerals  1 tablet Oral Daily   pantoprazole  40 mg Oral BID   thiamine  100 mg Oral Daily   Or   thiamine  100 mg Intravenous Daily   Continuous Infusions:  lactated ringers 100 mL/hr at 04/30/22 0600    PRN Meds:.acetaminophen, HYDROmorphone (DILAUDID) injection, LORazepam, oxyCODONE, polyethylene glycol, prochlorperazine    Subjective:   Talyn Dessert was seen and examined today. No nausea, vomiting.  Abd pain improving.   Objective:   Vitals:   04/30/22 0000 04/30/22 0400 04/30/22 0752 04/30/22 1227  BP: 107/67 117/81 101/70 108/63  Pulse:   (!) 58 64  Resp: 15 12 18 15   Temp:  98.4 F (36.9 C) 98.6 F (37 C) 98.8 F (37.1 C)  TempSrc:  Oral Oral Oral  SpO2:  100%    Weight:        Intake/Output Summary (Last 24 hours) at 04/30/2022 1520 Last data filed at 04/30/2022 1227 Gross per 24 hour  Intake 2330.26 ml  Output 4470 ml  Net -2139.74 ml    Filed Weights   04/28/22 1705  Weight: 68.9 kg     Exam General exam: Appears calm and comfortable  Respiratory system: Clear to auscultation. Respiratory effort normal. Cardiovascular system: S1 & S2 heard, RRR. No JVD, murmurs, rubs, gallops or clicks. No pedal edema. Gastrointestinal system: Abdomen is soft, tender moderate. Bs+ Central nervous system: Alert and oriented. No focal neurological deficits. Extremities: Symmetric 5 x 5 power. Skin: No  rashes, lesions or ulcers Psychiatry: Judgement and insight appear normal. Mood & affect appropriate.       Data Reviewed:  I have personally reviewed following labs and imaging studies   CBC Lab Results  Component Value Date   WBC 4.2 04/29/2022   RBC 3.14 (L) 04/29/2022   HGB 10.4 (L) 04/29/2022   HCT 32.1 (L) 04/29/2022   MCV 102.2 (H) 04/29/2022   MCH 33.1 04/29/2022   PLT 459 (H) 04/29/2022   MCHC 32.4 04/29/2022   RDW 13.3 04/29/2022   LYMPHSABS 1.0 04/29/2022   MONOABS 0.3 04/29/2022   EOSABS 0.1 04/29/2022   BASOSABS 0.0 04/29/2022     Last  metabolic panel Lab Results  Component Value Date   NA 137 04/29/2022   K 4.0 04/29/2022   CL 104 04/29/2022   CO2 29 04/29/2022   BUN <5 (L) 04/29/2022   CREATININE 0.66 04/29/2022   GLUCOSE 97 04/29/2022   GFRNONAA >60 04/29/2022   GFRAA 124 03/20/2021   CALCIUM 9.2 04/29/2022   PHOS 3.7 04/26/2022   PROT 5.5 (L) 04/29/2022   ALBUMIN 2.9 (L) 04/29/2022   BILITOT 0.5 04/29/2022   ALKPHOS 37 (L) 04/29/2022   AST 22 04/29/2022   ALT 19 04/29/2022   ANIONGAP 4 (L) 04/29/2022    CBG (last 3)  No results for input(s): "GLUCAP" in the last 72 hours.    Coagulation Profile: No results for input(s): "INR", "PROTIME" in the last 168 hours.   Radiology Studies: No results found.     Kathlen Mody M.D. [facility] 04/30/2022, 3:20 PM  Available via Epic secure chat 7am-7pm After 7 pm, please refer to night coverage provider listed on amion.

## 2022-05-01 ENCOUNTER — Other Ambulatory Visit (HOSPITAL_COMMUNITY): Payer: Self-pay

## 2022-05-01 LAB — CBC WITH DIFFERENTIAL/PLATELET
Abs Immature Granulocytes: 0.01 10*3/uL (ref 0.00–0.07)
Basophils Absolute: 0.1 10*3/uL (ref 0.0–0.1)
Basophils Relative: 1 %
Eosinophils Absolute: 0.2 10*3/uL (ref 0.0–0.5)
Eosinophils Relative: 4 %
HCT: 33.1 % — ABNORMAL LOW (ref 39.0–52.0)
Hemoglobin: 10.9 g/dL — ABNORMAL LOW (ref 13.0–17.0)
Immature Granulocytes: 0 %
Lymphocytes Relative: 30 %
Lymphs Abs: 1.3 10*3/uL (ref 0.7–4.0)
MCH: 33.1 pg (ref 26.0–34.0)
MCHC: 32.9 g/dL (ref 30.0–36.0)
MCV: 100.6 fL — ABNORMAL HIGH (ref 80.0–100.0)
Monocytes Absolute: 0.4 10*3/uL (ref 0.1–1.0)
Monocytes Relative: 10 %
Neutro Abs: 2.3 10*3/uL (ref 1.7–7.7)
Neutrophils Relative %: 55 %
Platelets: 396 10*3/uL (ref 150–400)
RBC: 3.29 MIL/uL — ABNORMAL LOW (ref 4.22–5.81)
RDW: 13.4 % (ref 11.5–15.5)
WBC: 4.2 10*3/uL (ref 4.0–10.5)
nRBC: 0 % (ref 0.0–0.2)

## 2022-05-01 LAB — BASIC METABOLIC PANEL
Anion gap: 8 (ref 5–15)
BUN: 10 mg/dL (ref 6–20)
CO2: 26 mmol/L (ref 22–32)
Calcium: 9.3 mg/dL (ref 8.9–10.3)
Chloride: 104 mmol/L (ref 98–111)
Creatinine, Ser: 0.7 mg/dL (ref 0.61–1.24)
GFR, Estimated: >60 mL/min (ref >60)
Glucose, Bld: 91 mg/dL (ref 70–99)
Potassium: 4 mmol/L (ref 3.5–5.1)
Sodium: 138 mmol/L (ref 135–145)

## 2022-05-01 LAB — HEPATIC FUNCTION PANEL
ALT: 17 U/L (ref 0–44)
AST: 16 U/L (ref 15–41)
Albumin: 3.1 g/dL — ABNORMAL LOW (ref 3.5–5.0)
Alkaline Phosphatase: 34 U/L — ABNORMAL LOW (ref 38–126)
Bilirubin, Direct: 0.2 mg/dL (ref 0.0–0.2)
Indirect Bilirubin: 0.4 mg/dL (ref 0.3–0.9)
Total Bilirubin: 0.6 mg/dL (ref 0.3–1.2)
Total Protein: 5.8 g/dL — ABNORMAL LOW (ref 6.5–8.1)

## 2022-05-01 LAB — LIPASE, BLOOD: Lipase: 292 U/L — ABNORMAL HIGH (ref 11–51)

## 2022-05-01 MED ORDER — HYDROMORPHONE HCL 1 MG/ML IJ SOLN: 0.5000 mg | Freq: Three times a day (TID) | INTRAMUSCULAR | Status: DC | PRN

## 2022-05-01 MED ORDER — LORAZEPAM 0.5 MG PO TABS
0.5000 mg | ORAL_TABLET | Freq: Two times a day (BID) | ORAL | 0 refills | Status: AC | PRN
  Filled 2022-05-01: qty 6, 3d supply, fill #0

## 2022-05-01 MED ORDER — OXYCODONE HCL 5 MG PO TABS
5.0000 mg | ORAL_TABLET | Freq: Three times a day (TID) | ORAL | 0 refills | Status: AC | PRN
  Filled 2022-05-01: qty 9, 3d supply, fill #0

## 2022-05-01 MED ORDER — FOLIC ACID 1 MG PO TABS
1.0000 mg | ORAL_TABLET | Freq: Every day | ORAL | 3 refills | Status: DC
  Filled 2022-05-01: qty 30, 30d supply, fill #0

## 2022-05-01 MED ORDER — PANTOPRAZOLE SODIUM 40 MG PO TBEC
40.0000 mg | DELAYED_RELEASE_TABLET | Freq: Two times a day (BID) | ORAL | 3 refills | Status: DC
  Filled 2022-05-01: qty 60, 30d supply, fill #0

## 2022-05-01 MED ORDER — SENNOSIDES-DOCUSATE SODIUM 8.6-50 MG PO TABS
1.0000 | ORAL_TABLET | Freq: Two times a day (BID) | ORAL | 3 refills | Status: DC
  Filled 2022-05-01: qty 30, 15d supply, fill #0

## 2022-05-01 MED ORDER — THIAMINE HCL 100 MG PO TABS
100.0000 mg | ORAL_TABLET | Freq: Every day | ORAL | 3 refills | Status: DC
  Filled 2022-05-01: qty 30, 30d supply, fill #0

## 2022-05-01 MED ORDER — POLYETHYLENE GLYCOL 3350 17 GM/SCOOP PO POWD
17.0000 g | Freq: Every day | ORAL | 0 refills | Status: DC | PRN
  Filled 2022-05-01: qty 238, 14d supply, fill #0

## 2022-05-01 MED ORDER — ADULT MULTIVITAMIN W/MINERALS CH
1.0000 | ORAL_TABLET | Freq: Every day | ORAL | 3 refills | Status: DC
  Filled 2022-05-01: qty 30, 30d supply, fill #0

## 2022-05-01 MED ORDER — BICTEGRAVIR-EMTRICITAB-TENOFOV 50-200-25 MG PO TABS
1.0000 | ORAL_TABLET | Freq: Every day | ORAL | 3 refills | Status: DC
  Filled 2022-05-01: qty 30, 30d supply, fill #0

## 2022-05-01 MED ORDER — GABAPENTIN 300 MG PO CAPS
300.0000 mg | ORAL_CAPSULE | Freq: Three times a day (TID) | ORAL | 3 refills | Status: DC
  Filled 2022-05-01: qty 90, 30d supply, fill #0

## 2022-05-01 MED ORDER — LEVETIRACETAM 500 MG PO TABS
500.0000 mg | ORAL_TABLET | Freq: Two times a day (BID) | ORAL | 3 refills | Status: DC
  Filled 2022-05-01: qty 60, 30d supply, fill #0

## 2022-05-01 MED ORDER — ONDANSETRON HCL 4 MG PO TABS
4.0000 mg | ORAL_TABLET | Freq: Three times a day (TID) | ORAL | 0 refills | Status: DC | PRN
  Filled 2022-05-01: qty 20, 7d supply, fill #0

## 2022-05-01 NOTE — TOC Initial Note (Addendum)
Transition of Care [facility]) - Initial/Assessment Note    Patient Details  Name: Gary [name] MRN: [id number] Date of Birth: Jul 19, 1986  Transition of Care [facility]) CM/SW Contact:    Harriet Masson, RN Phone Number: 05/01/2022, 12:37 PM  Clinical Narrative:                 Spoke to patient regarding transition needs. Patient agreeable to have CMA make PCP apt. Apt noted on AVS. Patient states he lives outside, homeless. Patient will need MATCH letter with copay waived at discharge. TOC will continue to follow for needs.  1400 MATCH and override done for discharge medications   Expected Discharge Plan: Homeless Shelter Barriers to Discharge: Continued Medical Work up   Patient Goals and CMS Choice Patient states their goals for this hospitalization and ongoing recovery are:: return to live outside, homeless      Expected Discharge Plan and Services Expected Discharge Plan: Homeless Shelter   Discharge Planning Services: MATCH Program, Follow-up appt scheduled   Living arrangements for the past 2 months: Homeless                                      Prior Living Arrangements/Services Living arrangements for the past 2 months: Homeless Lives with:: Self Patient language and need for interpreter reviewed:: Yes              Criminal Activity/Legal Involvement Pertinent to Current Situation/Hospitalization: No - Comment as needed  Activities of Daily Living      Permission Sought/Granted                  Emotional Assessment Appearance:: Appears stated age Attitude/Demeanor/Rapport: Engaged Affect (typically observed): Accepting Orientation: : Oriented to Self, Oriented to Place, Oriented to  Time, Oriented to Situation   Psych Involvement: No (comment)  Admission diagnosis:  Acute pancreatitis [K85.90] Dehydration [E86.0] AKI (acute kidney injury) (HCC) [N17.9] Alcohol withdrawal syndrome without complication (HCC) [F10.930] Acute pancreatitis  without infection or necrosis, unspecified pancreatitis type [K85.90] Patient Active Problem List   Diagnosis Date Noted   Acute pancreatitis 04/25/2022   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   Metabolic acidosis with increased anion gap and accumulation of organic acids 04/16/2022   History of seizures 04/16/2022   Prolonged QT interval 04/16/2022   Tobacco abuse 04/16/2022   Hypokalemia 01/22/2022   Chest pain 01/20/2022   Involuntary commitment secondary to suicidal ideation 01/20/2022   Alcohol withdrawal hallucinosis (HCC) alcohol use disorder, severe, dependence alcohol-induced mood disorder 01/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   Macrocytosis 12/31/2021   Withdrawal symptoms, alcohol (HCC) 11/06/2021   Constipation 07/30/2021   Alcohol-induced insomnia (HCC)    MDD (major depressive disorder), recurrent episode, 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/26/2021   Neutropenia (HCC) 07/26/2021   Alcohol withdrawal seizure without complication (HCC)    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   Elevated troponin 10/19/2020   Alcohol abuse with withdrawal (HCC) 10/19/2020   Alcohol abuse    Suicidal ideation 06/14/2020   Pancreatitis 06/13/2020   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   Malnutrition of moderate degree 05/07/2020   Gastrointestinal hemorrhage    Alcoholic hepatitis without ascites    Melena 05/05/2020   Alcoholic ketoacidosis 05/05/2020   Thrombocytopenia (HCC) 07/25/2019   Transaminitis 07/25/2019   Traumatic subdural hematoma (HCC) 07/02/2019   Alcohol intoxication with delirium (HCC) 07/02/2019   Hypocalcemia 07/02/2019   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 [place] [address] Phone: [phone] Fax: [phone]     Social Determinants of Health (SDOH) Interventions Food Insecurity Interventions: Other (Comment) (Community resources given) Housing Interventions: Other (Comment) (Community resources provided) Transportation Interventions: Bus Pass Given  Readmission Risk Interventions    04/27/2022    9:42 AM  Readmission Risk Prevention Plan  Transportation Screening Complete  Medication Review Ocean[facility]) Referral to Pharmacy  PCP or Specialist appointment within 3-5 days of discharge Complete  HRI or Home Care Consult Complete  SW Recovery Care/Counseling Consult Complete  Palliative Care Screening Not Applicable  Skilled Nursing Facility Not Applicable

## 2022-05-01 NOTE — Care Management (Signed)
MATCH and overrides done, Scripts sent to [facility] pharmacy. They will be in the [facility] to be picked up when DC.

## 2022-05-02 ENCOUNTER — Other Ambulatory Visit (HOSPITAL_COMMUNITY): Payer: Self-pay

## 2022-05-02 ENCOUNTER — Other Ambulatory Visit: Payer: Self-pay

## 2022-05-02 LAB — CREATININE, SERUM
Creatinine, Ser: 0.75 mg/dL (ref 0.61–1.24)
GFR, Estimated: >60 mL/min (ref >60)

## 2022-05-02 MED ORDER — POLYETHYLENE GLYCOL 3350 17 G PO PACK
17.0000 g | PACK | Freq: Two times a day (BID) | ORAL | Status: DC
  Administered 2022-05-02 (×2): 17 g via ORAL

## 2022-05-02 MED ORDER — BISACODYL 10 MG RE SUPP
10.0000 mg | Freq: Every day | RECTAL | Status: DC | PRN
  Administered 2022-05-02: 10 mg via RECTAL

## 2022-05-02 MED ORDER — ONDANSETRON HCL 4 MG/2ML IJ SOLN: 4.0000 mg | Freq: Four times a day (QID) | INTRAMUSCULAR | Status: DC | PRN

## 2022-05-02 MED ORDER — ORAL CARE MOUTH RINSE: 15.0000 mL | OROMUCOSAL | Status: DC | PRN

## 2022-05-02 MED ORDER — MAGNESIUM HYDROXIDE 400 MG/5ML PO SUSP
30.0000 mL | Freq: Every day | ORAL | Status: DC
  Administered 2022-05-02: 30 mL via ORAL

## 2022-05-03 DIAGNOSIS — K859 Acute pancreatitis without necrosis or infection, unspecified: Secondary | ICD-10-CM

## 2022-05-06 ENCOUNTER — Other Ambulatory Visit: Payer: Self-pay

## 2022-05-06 ENCOUNTER — Emergency Department (HOSPITAL_COMMUNITY)
Admission: EM | Admit: 2022-05-06 | Discharge: 2022-05-07 | Disposition: A | Discharge status: Home / Self Care | Payer: Self-pay | Attending: Emergency Medicine | Admitting: Emergency Medicine

## 2022-05-06 ENCOUNTER — Encounter (HOSPITAL_COMMUNITY): Payer: Self-pay | Admitting: Emergency Medicine

## 2022-05-06 DIAGNOSIS — Z21 Asymptomatic human immunodeficiency virus [HIV] infection status: Secondary | ICD-10-CM | POA: Insufficient documentation

## 2022-05-06 DIAGNOSIS — R569 Unspecified convulsions: Secondary | ICD-10-CM | POA: Insufficient documentation

## 2022-05-06 DIAGNOSIS — F10129 Alcohol abuse with intoxication, unspecified: Secondary | ICD-10-CM | POA: Insufficient documentation

## 2022-05-06 DIAGNOSIS — Y908 Blood alcohol level of 240 mg/100 ml or more: Secondary | ICD-10-CM | POA: Insufficient documentation

## 2022-05-06 DIAGNOSIS — F10920 Alcohol use, unspecified with intoxication, uncomplicated: Secondary | ICD-10-CM

## 2022-05-06 DIAGNOSIS — Z79899 Other long term (current) drug therapy: Secondary | ICD-10-CM | POA: Insufficient documentation

## 2022-05-06 HISTORY — DX: Post-traumatic stress disorder, unspecified: F43.10

## 2022-05-06 HISTORY — DX: Bipolar disorder, unspecified: F31.9

## 2022-05-06 HISTORY — DX: Schizophrenia, unspecified: F20.9

## 2022-05-06 HISTORY — DX: Anxiety disorder, unspecified: F41.9

## 2022-05-06 NOTE — Discharge Summary (Addendum)
Physician Discharge Summary   Patient: Gary [name] MRN: [id number] DOB: Apr 16, 1986  Admit date:     04/25/2022  Discharge date: 05/03/22  Discharge Physician: Kathlen Mody   PCP: Pcp, No   Recommendations at discharge:  Please follow up with PCP in one week.  Please follow with Gastroenterology in 2 weeks.   Discharge Diagnoses: Principal Problem:   Acute pancreatitis   Hospital Course:  36 year old gentleman with prior history of HIV with poor adherence to medication, seizure disorder, alcohol, tobacco abuse medical noncompliance homelessness, history of alcohol pancreatitis admitted on the April 16, 2022 for alcohol withdrawal and acute alcohol pancreatitis and left AMA on June 15, presents today with persistent nausea vomiting and abdominal pain.  Patient reports his last drink was on Saturday morning. Of note there was questionable mass in the third portion of the duodenum/marked thickened ascending colon on CT scan post EGD with enteroscopy did not show a mass probably possible pseudocyst/pancreatic collection compressing the duodenum.  Repeat CT scan of the abdomen and pelvis without contrast this admission shows significant decrease in the peripancreatic collection. Patient was found on the side of the road with intractable nausea vomiting and severe diffuse abdominal pain and was picked up by EMS.  Further work-up showed acute pancreatitis with lipase levels greater than 800.  He was admitted for IV fluids IV antiemetics and pain control.  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  Noncontrast CT showing slight decrease in the size of the fluid collection when compared to the previous CT. Interval distention of the stomach suggestive for degree of obstruction at the level of duodenum. Continue with IV fluids, aggressive hydration , IV antiemetics and IV pain  control. Patient was started on clear liquid diet , advanced to full liquid diet to soft diet.   Continue to follow lipase levels, will slowly advance diet . Abd pain improved with current pain regimen.  Pt currently on soft diet and able to tolerate,           Severe dehydration secondary to poor oral intake with intractable nausea and vomiting Much improved.  Stop the IV fluids. No nausea, vomiting.      AKI secondary to severe dehydration Resolved with IV fluids.          Hypokalemia Replaced.  Repeat levels wnl.      Mild hyponatremia probably secondary to alcohol intoxication. Resolved.    Severe protein calorie malnutrition Dietary consulted.      Alcohol withdrawal in the setting of persistent and severe alcohol abuse Patient was started on CIWA protocol Continue with multivitamin folic acid and thiamine supplements.   Patient will benefit from inpatient alcohol rehab.       Prolonged QTc Avoid QTc prolonging medications, keep magnesium greater than 2 and potassium greater than 4. EKG shows QTc improved.        Elevated liver chemistries in the setting of alcohol abuse Liver panel improved. He denies any nausea, vomiting.  No signs of withdrawal.        Tobacco use disorder Nicotine patch ordered.       HIV with medication nonadherence  Restarted Biktarvy.      Anemia of chronic disease:  Hemoglobin stable around 10.      Constipation:  Started him on senna, miralax, MOM and dulcolax suppository ordered.  Pt would like to have a BM before being discharged.  Mild thrombocytosis:  Reactive from acute pancreatitis.  Resolved.  Interventions: MVI, Hormel Shake      Consultants: none  Procedures performed: none   Disposition: Home Diet recommendation:  Discharge Diet Orders (From admission, onward)     Start     Ordered   05/03/22 0000  Diet - low sodium heart healthy        05/03/22 0852           Regular  diet DISCHARGE MEDICATION: Allergies as of 05/03/2022       Reactions   Tegretol [carbamazepine] Other (See Comments)   Caused vertigo for 2 days after taking it   Caffeine Palpitations        Medication List     TAKE these medications    bictegravir-emtricitabine-tenofovir AF 50-200-25 MG Tabs tablet Commonly known as: BIKTARVY Take 1 tablet by mouth daily.   CertaVite/Antioxidants Tabs Take 1 tablet by mouth daily.   folic acid 1 MG tablet Commonly known as: FOLVITE Take 1 tablet (1 mg total) by mouth daily.   gabapentin 300 MG capsule Commonly known as: NEURONTIN Take 1 capsule (300 mg total) by mouth 3 (three) times daily.   levETIRAcetam 500 MG tablet Commonly known as: KEPPRA Take 1 tablet (500 mg total) by mouth 2 (two) times daily.   ondansetron 4 MG tablet Commonly known as: Zofran Take 1 tablet (4 mg total) by mouth every 8 (eight) hours as needed for nausea or vomiting.   pantoprazole 40 MG tablet Commonly known as: PROTONIX Take 1 tablet (40 mg total) by mouth 2 (two) times daily.   polyethylene glycol powder 17 GM/SCOOP powder Commonly known as: GLYCOLAX/MIRALAX Use 1 capful (17 g) by mouth daily as needed for mild constipation.   Senexon-S 8.6-50 MG tablet Generic drug: senna-docusate Take 1 tablet by mouth 2 (two) times daily.   thiamine 100 MG tablet Take 1 tablet (100 mg total) by mouth daily.       ASK your doctor about these medications    LORazepam 0.5 MG tablet Commonly known as: Ativan Take 1 tablet (0.5 mg total) by mouth 2 (two) times daily as needed for up to 3 days for anxiety. Ask about: Should I take this medication?   oxyCODONE 5 MG immediate release tablet Commonly known as: Oxy IR/ROXICODONE Take 1 tablet (5 mg total) by mouth every 8 (eight) hours as needed for up to 3 days for moderate pain or breakthrough pain. Ask about: Should I take this medication?        Follow-up Information     Claiborne Rigg, NP  Follow up.   Specialty: Nurse Practitioner Why: TIME : 2:30 pm DATE: May 29, 2022 Contact information: [address] [place] [address] [phone]                Discharge Exam: Ceasar Mons Weights   04/28/22 1705  Weight: 68.9 kg   General exam: Appears calm and comfortable  Respiratory system: Clear to auscultation. Respiratory effort normal. Cardiovascular system: S1 & S2 heard, RRR. No JVD, murmurs, rubs, gallops or clicks. No pedal edema. Gastrointestinal system: Abdomen is nondistended, soft and nontender. No organomegaly or masses felt. Normal bowel sounds heard. Central nervous system: Alert and oriented. No focal neurological deficits. Extremities: Symmetric 5 x 5 power. Skin: No rashes, lesions or ulcers Psychiatry: Judgement and insight appear normal. Mood & affect appropriate.    Condition at discharge: fair  The results of significant diagnostics from this hospitalization (  including imaging, microbiology, ancillary and laboratory) are listed below for reference.   Imaging Studies: CT ABDOMEN PELVIS WO CONTRAST  Result Date: 04/25/2022 CLINICAL DATA:  Concern for acute pancreatitis. EXAM: CT ABDOMEN AND PELVIS WITHOUT CONTRAST TECHNIQUE: Multidetector CT imaging of the abdomen and pelvis was performed following the standard protocol without IV contrast. RADIATION DOSE REDUCTION: This exam was performed according to the departmental dose-optimization program which includes automated exposure control, adjustment of the mA and/or kV according to patient size and/or use of iterative reconstruction technique. COMPARISON:  CT abdomen pelvis dated 04/23/2022. FINDINGS: Evaluation of this exam is limited in the absence of intravenous contrast. Lower chest: There is mild eventration of the left hemidiaphragm the left lung base subsegmental atelectasis. The visualized right lung base is clear. No intra-abdominal free air. Small high attenuating/hemorrhagic  fluid within the pelvis. Hepatobiliary: Fatty liver. No intrahepatic biliary dilatation. The gallbladder is unremarkable. Pancreas: The body and tail of the pancreas appear unremarkable. Fluid collection abutting the head of the pancreas appears slightly smaller. Spleen: Normal in size without focal abnormality. Adrenals/Urinary Tract: The adrenal glands unremarkable. The kidneys, visualized ureters, and urinary bladder appear unremarkable. Stomach/Bowel: The stomach is moderately distended with air and fluid. Similar or slightly decreased size of the fluid collection abutting the duodenal C-loop and head of the pancreas compared to prior CT. Findings may represent hemorrhagic pancreatitis or duodenal hemorrhage secondary to trauma or coagulopathy. Underlying ulcer or mass is not excluded. Clinical correlation is recommended. No evidence of small-bowel obstruction. The appendix is normal. Vascular/Lymphatic: The abdominal aorta and IVC unremarkable. No portal venous gas. There is no adenopathy. Reproductive: The prostate and seminal vesicles are grossly unremarkable. No pelvic mass. Other: None Musculoskeletal: No acute or significant osseous findings. IMPRESSION: 1. Overall slight interval decrease in the size of the fluid collection abutting the head of the pancreas compared to prior CT. Findings in keeping with provided history of pancreatitis. Areas of higher attenuation within this collection suggest hemorrhage. 2. Interval distension of the stomach suggestive of a degree of obstruction at the level of the duodenum. 3. Fatty liver. Electronically Signed   By: Elgie Collard M.D.   On: 04/25/2022 19:32   DG Chest Portable 1 View  Result Date: 04/25/2022 CLINICAL DATA:  Altered mental status. EXAM: PORTABLE CHEST 1 VIEW COMPARISON:  One-view chest x-ray 04/16/2022 FINDINGS: Heart size is normal. Chronic elevation of left hemidiaphragm noted. Lungs are clear. Axial skeleton is unremarkable. IMPRESSION: No  acute cardiopulmonary disease. Electronically Signed   By: Marin Roberts M.D.   On: 04/25/2022 19:01   CT ABDOMEN PELVIS W CONTRAST  Result Date: 04/23/2022 CLINICAL DATA:  Follow-up pancreatitis. EXAM: CT ABDOMEN AND PELVIS WITH CONTRAST TECHNIQUE: Multidetector CT imaging of the abdomen and pelvis was performed using the standard protocol following bolus administration of intravenous contrast. RADIATION DOSE REDUCTION: This exam was performed according to the departmental dose-optimization program which includes automated exposure control, adjustment of the mA and/or kV according to patient size and/or use of iterative reconstruction technique. CONTRAST:  28mL OMNIPAQUE IOHEXOL 350 MG/ML SOLN COMPARISON:  04/16/2022 FINDINGS: Lower chest: New bilateral pleural effusions and bibasilar atelectasis. Hepatobiliary: No hepatic lesions or intrahepatic biliary dilatation. The gallbladder is unremarkable. No common bile duct dilatation. Pancreas: Much improved CT appearance of the pancreas. There is persistent mild inflammation and enlargement of the pancreatic head but it is much less significant. There is normal pancreatic enhancement without evidence of pancreatic necrosis. The large complex peripancreatic fluid collection is smaller  and now mainly localized to the right anterior pararenal space. Some areas of residual high attenuation suggest some component of blood. Spleen: Normal size. No focal lesions. Adrenals/Urinary Tract: Adrenal glands and kidneys are unremarkable and stable. The bladder is unremarkable. Stomach/Bowel: The stomach demonstrates mild distension. There is persistent moderate inflammation involving the second and third portions of the duodenum but no obstruction. The small bowel and colon are unremarkable. The terminal ileum and appendix are normal. Vascular/Lymphatic: The aorta and branch vessels are patent. The major venous structures are patent. The splenic and portal veins are  patent. Scattered lymph nodes are stable and likely reactive. Reproductive: The prostate gland and seminal vesicles are unremarkable. Other: Mixed attenuation fluid noted in the pelvis has increased but fluid elsewhere has significantly decreased. Musculoskeletal: No significant bony findings. IMPRESSION: 1. Much improved CT appearance of the pancreas. There is persistent mild inflammation and enlargement of the pancreatic head but it is much less significant. 2. The large complex peripancreatic fluid collection is much smaller and now mainly localized to the right anterior pararenal space. Some areas of residual high attenuation suggest some component of blood. 3. Persistent moderate inflammation involving the second and third portions of the duodenum but no obstruction. 4. New bilateral pleural effusions and bibasilar atelectasis. Electronically Signed   By: Rudie Meyer M.D.   On: 04/23/2022 12:15   DG Chest Port 1 View  Result Date: 04/16/2022 CLINICAL DATA:  Alcohol withdrawal.  Epilepsy. EXAM: PORTABLE CHEST 1 VIEW COMPARISON:  Chest radiograph 01/20/2022 FINDINGS: Chronic elevation of the left hemidiaphragm. Otherwise, the lungs are clear. Heart size is within normal limits and stable. Trachea is midline. Negative for a pneumothorax. No acute bone abnormality. IMPRESSION: No active disease. Electronically Signed   By: Richarda Overlie M.D.   On: 04/16/2022 13:29   CT ABDOMEN PELVIS W CONTRAST  Result Date: 04/16/2022 CLINICAL DATA:  Acute nonlocalized abdominal pain. Nausea, vomiting, and diarrhea onset a few weeks ago. Regular heavy alcohol use. Found down coronal week and hypotensive. EXAM: CT ABDOMEN AND PELVIS WITH CONTRAST TECHNIQUE: Multidetector CT imaging of the abdomen and pelvis was performed using the standard protocol following bolus administration of intravenous contrast. RADIATION DOSE REDUCTION: This exam was performed according to the departmental dose-optimization program which includes  automated exposure control, adjustment of the mA and/or kV according to patient size and/or use of iterative reconstruction technique. CONTRAST:  OMNIPAQUE IOHEXOL 350 MG/ML SOLN COMPARISON:  AP chest 01/20/2022; right upper quadrant abdominal ultrasound.; CT abdomen and pelvis 07/11/2021; MRI abdomen 03/17/2021 FINDINGS: Lower chest: There is chronic elevation of the left hemidiaphragm with superior most aspect of the left hemidiaphragm/left upper abdominal quadrant incompletely imaged. The lung bases are clear. Hepatobiliary: There is diffuse moderate decreased density seen throughout the liver suggesting fatty infiltration. Smooth liver contours. There is a low-density 2.1 x 1.4 x 2.3 cm (transverse by AP by craniocaudal) lesion within the anterior inferior aspect of the medial segment of the left hepatic lobe (axial image 27 and coronal image 11), not significantly changed in size from 07/06/2020 measured in a similar manner. This may represent an area of focal fatty infiltration or a cyst. The gallbladder is moderately distended but retains a normal thin wall without definite inflammatory change. Pancreas: There is mild fat stranding around and haziness within the region of the pancreatic head likely secondary to the process in the third portion of the duodenum. Spleen: The superior aspect of the spleen is not imaged but the spleen is  otherwise unremarkable. Adrenals/Urinary Tract: Normal adrenals. There is slightly decreased enhancement of the right kidney with respect to the left kidney. No hydronephrosis. No focal urinary bladder wall thickening. Stomach/Bowel: Mild sigmoid and distal diffuse descending colon wall thickening may be due to underdistention versus mild colitis. There is high-grade decompression of the transverse colon which is difficult to differentiate from adjacent ascites. Normal appendix (axial series 3, images 51 through 62). There is abnormal wall thickening of the first second  third and fourth portions of the duodenum with the highest grade thickening seen within the posterior third portion of the duodenal wall measuring up to 4.3 cm in AP dimension (axial series 2, image 35). This is highly concerning for "pseudoaneurysmal dilatation of the small bowel" as can be seen with lymphoma. There is also apparent thickening of the ascending greater than transverse colon wall and with surrounding mesenteric fluid and inflammatory fat stranding in the ascending colon is not well visualized, with suspected moderate wall thickening. Right upper quadrant linear air (axial series 2, images 50-58) is favored to represent air within narrowed distal ascending colon. No definite pneumatosis. No definite abnormality is seen within the terminal ileum. Vascular/Lymphatic: No abdominal aortic aneurysm. The major intra-abdominal aortic branch vessels are patent. There is high-grade thickening of the wall of the third portion of the duodenum. No separate enlarged individual mesenteric retroperitoneal or pelvic lymph nodes are identified. Reproductive: The prostate measures up to 5.0 cm in transverse dimension, moderately distended. The seminal vesicles are grossly unremarkable. Other: No ventral abdominal wall hernia is seen. There is mild-to-moderate free fluid within the right hemiabdomen mesentery greatest in the region of the right lower quadrant posterior to the ascending colon. No pneumoperitoneum is identified. Musculoskeletal: There is transitional lumbosacral anatomy with 6 non-rib-bearing vertebral bodies, considered L1 through L6. Partial sacralization of L6. Rudimentary L6-S1 disc. Moderate disc space narrowing and anterior endplate osteophytes of the lower thoracic spine. IMPRESSION: 1. The third portion of the duodenum and ascending colon are markedly abnormal. There appears to be high-grade mass-like wall thickening of the posterior greater than anterior aspects of the third portion of the  duodenum. There is ascites greatest within the right lower quadrant, surrounding the ascending colon which is apparently decompressed with more mild to moderate thick wall. The more distal colon is decompressed which may be the cause for apparent wall thickening in these regions. No definite bowel perforation is seen, although note is made that the ascending colon is difficult to visualize, and on axial images 50 through 58 there is air that is favored to be within narrow distal ascending colon lumen rather than extraluminal air. In addition to small bowel masses such as lymphoma, inflammatory bowel disease is considered but felt less likely. Note is made that these regions of bowel were normal in appearance on 07/11/2021 prior CT. 2. Moderate fatty infiltration of the liver. Critical Value/emergent results were called by telephone at the time of interpretation on 04/16/2022 at 12:53 pm to provider RILEY RANSOM and Lynden Oxford, MD , who verbally acknowledged these results. Electronically Signed   By: Neita Garnet M.D.   On: 04/16/2022 12:56   CT Head Wo Contrast  Result Date: 04/16/2022 CLINICAL DATA:  Found down EXAM: CT HEAD WITHOUT CONTRAST CT CERVICAL SPINE WITHOUT CONTRAST TECHNIQUE: Multidetector CT imaging of the head and cervical spine was performed following the standard protocol without intravenous contrast. Multiplanar CT image reconstructions of the cervical spine were also generated. RADIATION DOSE REDUCTION: This exam was performed  according to the departmental dose-optimization program which includes automated exposure control, adjustment of the mA and/or kV according to patient size and/or use of iterative reconstruction technique. COMPARISON:  03/12/2022 CT head 09/22/2021 CT cervical spine FINDINGS: CT HEAD FINDINGS Brain: No evidence of acute infarction, hemorrhage, cerebral edema, mass, mass effect, or midline shift. No hydrocephalus or extra-axial fluid collection. Vascular: No  hyperdense vessel. Skull: Normal. Negative for fracture or focal lesion. Sinuses/Orbits: No acute finding. Other: The mastoid air cells are well aerated. CT CERVICAL SPINE FINDINGS Alignment: Trace retrolisthesis of C3 on C4, C4 on C5, and C6 on C7, which appear unchanged compared to 09/22/2021. Normal facet alignment. Skull base and vertebrae: No acute fracture or suspicious osseous lesion. Soft tissues and spinal canal: No prevertebral fluid or swelling. No visible canal hematoma. Disc levels: Mild degenerative changes without high-grade spinal canal stenosis or neural foraminal narrowing. Upper chest: No focal pulmonary opacity or pleural effusion. Other: None. IMPRESSION: 1.  No acute intracranial process. 2.  No acute fracture or traumatic listhesis in the cervical spine. Electronically Signed   By: Wiliam KeAlison  Vasan M.D.   On: 04/16/2022 12:22   CT Cervical Spine Wo Contrast  Result Date: 04/16/2022 CLINICAL DATA:  Found down EXAM: CT HEAD WITHOUT CONTRAST CT CERVICAL SPINE WITHOUT CONTRAST TECHNIQUE: Multidetector CT imaging of the head and cervical spine was performed following the standard protocol without intravenous contrast. Multiplanar CT image reconstructions of the cervical spine were also generated. RADIATION DOSE REDUCTION: This exam was performed according to the departmental dose-optimization program which includes automated exposure control, adjustment of the mA and/or kV according to patient size and/or use of iterative reconstruction technique. COMPARISON:  03/12/2022 CT head 09/22/2021 CT cervical spine FINDINGS: CT HEAD FINDINGS Brain: No evidence of acute infarction, hemorrhage, cerebral edema, mass, mass effect, or midline shift. No hydrocephalus or extra-axial fluid collection. Vascular: No hyperdense vessel. Skull: Normal. Negative for fracture or focal lesion. Sinuses/Orbits: No acute finding. Other: The mastoid air cells are well aerated. CT CERVICAL SPINE FINDINGS Alignment: Trace  retrolisthesis of C3 on C4, C4 on C5, and C6 on C7, which appear unchanged compared to 09/22/2021. Normal facet alignment. Skull base and vertebrae: No acute fracture or suspicious osseous lesion. Soft tissues and spinal canal: No prevertebral fluid or swelling. No visible canal hematoma. Disc levels: Mild degenerative changes without high-grade spinal canal stenosis or neural foraminal narrowing. Upper chest: No focal pulmonary opacity or pleural effusion. Other: None. IMPRESSION: 1.  No acute intracranial process. 2.  No acute fracture or traumatic listhesis in the cervical spine. Electronically Signed   By: Wiliam KeAlison  Vasan M.D.   On: 04/16/2022 12:22    Microbiology: Results for orders placed or performed during the hospital encounter of 04/25/22  Blood culture (routine x 2)     Status: None   Collection Time: 04/25/22  7:41 PM   Specimen: BLOOD  Result Value Ref Range Status   Specimen Description BLOOD LEFT ANTECUBITAL  Final   Special Requests   Final    BOTTLES DRAWN AEROBIC AND ANAEROBIC Blood Culture adequate volume   Culture   Final    NO GROWTH 5 DAYS Performed at [facility], [address] [place], [place] [id number]    Report Status 04/30/2022 FINAL  Final  Blood culture (routine x 2)     Status: None   Collection Time: 04/25/22  9:22 PM   Specimen: BLOOD RIGHT ARM  Result Value Ref Range Status   Specimen Description  BLOOD RIGHT ARM  Final   Special Requests   Final    BOTTLES DRAWN AEROBIC AND ANAEROBIC Blood Culture adequate volume   Culture   Final    NO GROWTH 5 DAYS Performed at [facility], [address] [place], [place] [address]    Report Status 04/30/2022 FINAL  Final    Labs: CBC: Recent Labs  Lab 05/01/22 0904  WBC 4.2  NEUTROABS 2.3  HGB 10.9*  HCT 33.1*  MCV 100.6*  PLT 396   Basic Metabolic Panel: Recent Labs  Lab 05/01/22 0904 05/02/22 0451  NA 138  --   K 4.0  --   CL 104  --   CO2 26  --   GLUCOSE 91  --   BUN 10  --    CREATININE 0.70 0.75  CALCIUM 9.3  --    Liver Function Tests: Recent Labs  Lab 05/01/22 0904  AST 16  ALT 17  ALKPHOS 34*  BILITOT 0.6  PROT 5.8*  ALBUMIN 3.1*   CBG: No results for input(s): "GLUCAP" in the last 168 hours.  Discharge time spent: 45 minutes  Signed: Kathlen Mody, MD [facility] 05/06/2022

## 2022-05-06 NOTE — ED Triage Notes (Signed)
Pt BIB [facility] from [facility] post unwitnessed seizure; pt reports to ETOH (1/2 bottle of Vodka, last drink 1400); hx of seizures and epilepsy, last had Keppra yesterday; per EMS CBG 87, v/s and EKG WNL

## 2022-05-07 ENCOUNTER — Other Ambulatory Visit (HOSPITAL_COMMUNITY): Payer: Self-pay

## 2022-05-07 LAB — COMPREHENSIVE METABOLIC PANEL
ALT: 13 U/L (ref 0–44)
AST: 19 U/L (ref 15–41)
Albumin: 3.9 g/dL (ref 3.5–5.0)
Alkaline Phosphatase: 37 U/L — ABNORMAL LOW (ref 38–126)
Anion gap: 11 (ref 5–15)
BUN: 7 mg/dL (ref 6–20)
CO2: 24 mmol/L (ref 22–32)
Calcium: 8.4 mg/dL — ABNORMAL LOW (ref 8.9–10.3)
Chloride: 104 mmol/L (ref 98–111)
Creatinine, Ser: 0.69 mg/dL (ref 0.61–1.24)
GFR, Estimated: >60 mL/min (ref >60)
Glucose, Bld: 88 mg/dL (ref 70–99)
Potassium: 3.6 mmol/L (ref 3.5–5.1)
Sodium: 139 mmol/L (ref 135–145)
Total Bilirubin: 0.5 mg/dL (ref 0.3–1.2)
Total Protein: 7.1 g/dL (ref 6.5–8.1)

## 2022-05-07 LAB — CBC WITH DIFFERENTIAL/PLATELET
Abs Immature Granulocytes: 0.03 10*3/uL (ref 0.00–0.07)
Basophils Absolute: 0.1 10*3/uL (ref 0.0–0.1)
Basophils Relative: 3 %
Eosinophils Absolute: 0.1 10*3/uL (ref 0.0–0.5)
Eosinophils Relative: 3 %
HCT: 30.8 % — ABNORMAL LOW (ref 39.0–52.0)
Hemoglobin: 10.2 g/dL — ABNORMAL LOW (ref 13.0–17.0)
Immature Granulocytes: 1 %
Lymphocytes Relative: 53 %
Lymphs Abs: 1.7 10*3/uL (ref 0.7–4.0)
MCH: 33.9 pg (ref 26.0–34.0)
MCHC: 33.1 g/dL (ref 30.0–36.0)
MCV: 102.3 fL — ABNORMAL HIGH (ref 80.0–100.0)
Monocytes Absolute: 0.2 10*3/uL (ref 0.1–1.0)
Monocytes Relative: 6 %
Neutro Abs: 1.1 10*3/uL — ABNORMAL LOW (ref 1.7–7.7)
Neutrophils Relative %: 34 %
Platelets: 280 10*3/uL (ref 150–400)
RBC: 3.01 MIL/uL — ABNORMAL LOW (ref 4.22–5.81)
RDW: 13.3 % (ref 11.5–15.5)
WBC: 3.3 10*3/uL — ABNORMAL LOW (ref 4.0–10.5)
nRBC: 0 % (ref 0.0–0.2)

## 2022-05-07 LAB — RAPID URINE DRUG SCREEN, HOSP PERFORMED
Amphetamines: NOT DETECTED
Barbiturates: NOT DETECTED
Benzodiazepines: NOT DETECTED
Cocaine: NOT DETECTED
Opiates: NOT DETECTED
Tetrahydrocannabinol: NOT DETECTED

## 2022-05-07 LAB — ETHANOL: Alcohol, Ethyl (B): 311 mg/dL (ref <10)

## 2022-05-07 MED ORDER — ACETAMINOPHEN 325 MG PO TABS: 650.0000 mg | ORAL_TABLET | Freq: Once | ORAL | Status: AC

## 2022-05-07 MED ORDER — ACETAMINOPHEN 325 MG PO TABS
ORAL_TABLET | ORAL | Status: AC
  Administered 2022-05-07: 650 mg via ORAL
  Filled 2022-05-07: qty 2

## 2022-05-07 MED ORDER — ONDANSETRON HCL 4 MG/2ML IJ SOLN
4.0000 mg | Freq: Once | INTRAMUSCULAR | Status: AC
  Administered 2022-05-07: 4 mg via INTRAVENOUS

## 2022-05-07 MED ORDER — ONDANSETRON HCL 4 MG/2ML IJ SOLN
INTRAMUSCULAR | Status: AC
  Filled 2022-05-07: qty 2

## 2022-05-07 MED ORDER — SODIUM CHLORIDE 0.9 % IV BOLUS
1000.0000 mL | Freq: Once | INTRAVENOUS | Status: AC
  Administered 2022-05-07: 1000 mL via INTRAVENOUS

## 2022-05-07 NOTE — ED Provider Notes (Signed)
[facility]-EMERGENCY DEPT Provider Note  CSN: [id number] Arrival date & time: 05/06/22 2305  Chief Complaint(s) Seizures  Pt BIB [facility] from [facility] post unwitnessed seizure; pt reports to ETOH (1/2 bottle of Vodka, last drink 1400); hx of seizures and epilepsy, last had Keppra yesterday; per EMS CBG 87, v/s and EKG WNL HPI Gary [name] is a 36 y.o. male with a past medical history listed below including alcohol abuse, bipolar/schizophrenia, TBI and seizures on Keppra here for reported seizure.  Unwitnessed.  Patient endorsed heavy alcohol use throughout the day.  Drinking beer and vodka. Denies chest pain, abd pain, N/V.  Reports that he last took his Keppra yesterday.  The history is provided by the patient.    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   Metabolic acidosis with increased anion gap and accumulation of organic acids 04/16/2022   History of seizures 04/16/2022   Prolonged QT interval 04/16/2022   Tobacco abuse 04/16/2022   Hypokalemia 01/22/2022   Chest pain 01/20/2022   Involuntary commitment secondary to suicidal ideation 01/20/2022   Alcohol withdrawal hallucinosis (HCC) alcohol use disorder, severe, dependence alcohol-induced mood disorder 01/01/2022   Macrocytosis 12/31/2021   Withdrawal symptoms, alcohol (HCC) 11/06/2021   Constipation 07/30/2021   Alcohol-induced insomnia (HCC)    MDD (major depressive disorder), recurrent episode, 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/26/2021   Neutropenia (HCC) 07/26/2021   Alcohol withdrawal seizure without complication (HCC)     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   Elevated troponin 10/19/2020   Alcohol abuse with withdrawal (HCC) 10/19/2020   Alcohol abuse    Suicidal ideation 06/14/2020   Pancreatitis 06/13/2020   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   Malnutrition of moderate degree 05/07/2020   Gastrointestinal hemorrhage    Alcoholic hepatitis without ascites    Melena 05/05/2020   Alcoholic ketoacidosis 05/05/2020   Thrombocytopenia (HCC) 07/25/2019   Transaminitis 07/25/2019   Traumatic subdural hematoma (HCC) 07/02/2019   Alcohol intoxication with delirium (HCC) 07/02/2019   Hypocalcemia 07/02/2019   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 Authorizing Provider  bictegravir-emtricitabine-tenofovir AF (BIKTARVY) 50-200-25 MG TABS tablet Take 1 tablet by mouth daily. 05/01/22  Yes Kathlen Mody, MD  folic acid (FOLVITE) 1 MG tablet Take 1 tablet (1 mg total) by mouth daily. 05/01/22  Yes Kathlen Mody, MD  gabapentin (NEURONTIN) 300 MG capsule Take 1 capsule (300 mg total) by mouth 3 (three) times daily. 05/01/22  Yes Kathlen Mody, MD  levETIRAcetam (KEPPRA) 500 MG tablet Take 1 tablet (500 mg total) by mouth 2 (two) times daily. 05/01/22  Yes Kathlen Mody, MD  Multiple Vitamin (MULTIVITAMIN WITH MINERALS) TABS tablet Take 1 tablet by mouth daily. 05/02/22  Yes Kathlen Mody, MD  ondansetron (ZOFRAN) 4 MG tablet Take  1 tablet (4 mg total) by mouth every 8 (eight) hours as needed for nausea or vomiting. 05/01/22  Yes Kathlen Mody, MD  pantoprazole (PROTONIX) 40 MG tablet Take 1 tablet (40 mg total) by mouth 2 (two) times daily. 05/01/22  Yes  Kathlen Mody, MD  polyethylene glycol powder (GLYCOLAX/MIRALAX) 17 GM/SCOOP powder Use 1 capful (17 g) by mouth daily as needed for mild constipation. 05/01/22  Yes Kathlen Mody, MD  senna-docusate (SENOKOT-S) 8.6-50 MG tablet Take 1 tablet by mouth 2 (two) times daily. 05/01/22  Yes Kathlen Mody, MD  thiamine 100 MG tablet Take 1 tablet (100 mg total) by mouth daily. 05/01/22  Yes Kathlen Mody, MD  famotidine (PEPCID) 20 MG tablet Take 1 tablet (20 mg total) by mouth 2 (two) times daily. Patient not taking: Reported on 06/01/2019 05/14/19 06/02/19  Benjiman Core, MD                                                                                                                                    Allergies Tegretol [carbamazepine] and Caffeine  Review of Systems Review of Systems As noted in HPI  Physical Exam Vital Signs  I have reviewed the triage vital signs BP (!) 101/51 (BP Location: Right Arm)   Pulse 75   Temp 98.1 F (36.7 C) (Oral)   Resp 17   Ht 6' (1.829 m)   Wt 68.5 kg   SpO2 99%   BMI 20.48 kg/m   Physical Exam Vitals reviewed.  Constitutional:      General: He is not in acute distress.    Appearance: He is well-developed. He is not diaphoretic.  HENT:     Head: Normocephalic and atraumatic.     Nose: Nose normal.  Eyes:     General: No scleral icterus.       Right eye: No discharge.        Left eye: No discharge.     Conjunctiva/sclera: Conjunctivae normal.     Pupils: Pupils are equal, round, and reactive to light.  Cardiovascular:     Rate and Rhythm: Normal rate and regular rhythm.     Heart sounds: No murmur heard.    No friction rub. No gallop.  Pulmonary:     Effort: Pulmonary effort is normal. No respiratory distress.     Breath sounds: Normal breath sounds. No stridor. No rales.  Abdominal:     General: There is no distension.     Palpations: Abdomen is soft.     Tenderness: There is no abdominal tenderness.  Musculoskeletal:        General:  No tenderness.     Cervical back: Normal range of motion and neck supple.  Skin:    General: Skin is warm and dry.     Findings: No erythema or rash.  Neurological:     Mental Status: He is alert and oriented to person, place, and time.  ED Results and Treatments Labs (all labs ordered are listed, but only abnormal results are displayed) Labs Reviewed  COMPREHENSIVE METABOLIC PANEL - Abnormal; Notable for the following components:      Result Value   Calcium 8.4 (*)    Alkaline Phosphatase 37 (*)    All other components within normal limits  ETHANOL - Abnormal; Notable for the following components:   Alcohol, Ethyl (B) 311 (*)    All other components within normal limits  CBC WITH DIFFERENTIAL/PLATELET - Abnormal; Notable for the following components:   WBC 3.3 (*)    RBC 3.01 (*)    Hemoglobin 10.2 (*)    HCT 30.8 (*)    MCV 102.3 (*)    Neutro Abs 1.1 (*)    All other components within normal limits  RAPID URINE DRUG SCREEN, HOSP PERFORMED  CBC WITH DIFFERENTIAL/PLATELET  LEVETIRACETAM LEVEL                                                                                                                         EKG  EKG Interpretation  Date/Time:  Wednesday May 06 2022 23:35:34 EDT Ventricular Rate:  69 PR Interval:  156 QRS Duration: 104 QT Interval:  454 QTC Calculation: 487 R Axis:   70 Text Interpretation: Sinus rhythm Probable anteroseptal infarct, old Confirmed by Drema Pry [id number]) on 05/07/2022 1:07:04 AM       Radiology No results found.  Pertinent labs & imaging results that were available during my care of the patient were reviewed by me and considered in my medical decision making (see MDM for details).  Medications Ordered in ED Medications  sodium chloride 0.9 % bolus 1,000 mL (0 mLs Intravenous Stopped 05/07/22 0252)  ondansetron (ZOFRAN) injection 4 mg (4 mg Intravenous Given 05/07/22 0510)  acetaminophen (TYLENOL) tablet 650 mg (650 mg  Oral Given 05/07/22 0510)                                                                                                                                     Procedures Procedures  (including critical care time)  Medical Decision Making / ED Course    Complexity of Problem:  Co-morbidities/SDOH that complicate the patient evaluation/care: Noted above in HPI  Patient's presenting problem/concern, DDX, and MDM listed below: Reported seizure in the setting of alcohol use Unlikely withdrawal Possible lowered seizure threshold due to alcohol Patient  appears to be intoxicated but at baseline with no focal deficits and oriented x4. We will obtain screening labs and allow patient to metabolize to freedom    Complexity of Data:   Cardiac Monitoring: The patient was maintained on a cardiac monitor.   I personally viewed and interpreted the cardiac monitored which showed an underlying rhythm of normal sinus rhythm with rates in the 60s to 70s EKG without acute ischemic changes  Laboratory Tests ordered listed below with my independent interpretation: CBC without leukocytosis.  Stable hemoglobin. No metabolic derangements or renal sufficiency Alcohol 311 UDS negative     ED Course:    Assessment, Add'l Intervention, and Reassessment: Reported seizure  At baseline. Alcohol intoxication Allowed to MTF Provided with IVF Ambulated well after several hours  Final Clinical Impression(s) / ED Diagnoses Final diagnoses:  Alcoholic intoxication without complication (HCC)   The patient appears reasonably screened and/or stabilized for discharge and I doubt any other medical condition or other [facility] requiring further screening, evaluation, or treatment in the ED at this time prior to discharge. Safe for discharge with strict return precautions.  Disposition: Discharge  Condition: Good  I have discussed the results, Dx and Tx plan with the patient/family who expressed understanding and  agree(s) with the plan. Discharge instructions discussed at length. The patient/family was given strict return precautions who verbalized understanding of the instructions. No further questions at time of discharge.    ED Discharge Orders     None         Follow Up: Primary care provider  Schedule an appointment as soon as possible for a visit             This chart was dictated using voice recognition software.  Despite best efforts to proofread,  errors can occur which can change the documentation meaning.    Nira Conn, MD 05/07/22 [phone]

## 2022-05-08 LAB — LEVETIRACETAM LEVEL: Levetiracetam Lvl: <2 ug/mL — ABNORMAL LOW (ref 10.0–40.0)

## 2022-05-15 ENCOUNTER — Other Ambulatory Visit: Payer: Self-pay

## 2022-05-15 ENCOUNTER — Emergency Department (HOSPITAL_COMMUNITY)
Admission: EM | Admit: 2022-05-15 | Discharge: 2022-05-16 | Disposition: A | Discharge status: Home / Self Care | Payer: Self-pay | Attending: Emergency Medicine | Admitting: Emergency Medicine

## 2022-05-15 ENCOUNTER — Encounter (HOSPITAL_COMMUNITY): Payer: Self-pay | Admitting: Emergency Medicine

## 2022-05-15 DIAGNOSIS — Y908 Blood alcohol level of 240 mg/100 ml or more: Secondary | ICD-10-CM | POA: Insufficient documentation

## 2022-05-15 DIAGNOSIS — F1010 Alcohol abuse, uncomplicated: Secondary | ICD-10-CM

## 2022-05-15 DIAGNOSIS — R45851 Suicidal ideations: Secondary | ICD-10-CM | POA: Insufficient documentation

## 2022-05-15 DIAGNOSIS — R410 Disorientation, unspecified: Secondary | ICD-10-CM | POA: Insufficient documentation

## 2022-05-15 DIAGNOSIS — R451 Restlessness and agitation: Secondary | ICD-10-CM | POA: Insufficient documentation

## 2022-05-15 LAB — CBC
HCT: 38.7 % — ABNORMAL LOW (ref 39.0–52.0)
Hemoglobin: 12.8 g/dL — ABNORMAL LOW (ref 13.0–17.0)
MCH: 33.3 pg (ref 26.0–34.0)
MCHC: 33.1 g/dL (ref 30.0–36.0)
MCV: 100.8 fL — ABNORMAL HIGH (ref 80.0–100.0)
Platelets: 163 10*3/uL (ref 150–400)
RBC: 3.84 MIL/uL — ABNORMAL LOW (ref 4.22–5.81)
RDW: 13.8 % (ref 11.5–15.5)
WBC: 2.7 10*3/uL — ABNORMAL LOW (ref 4.0–10.5)
nRBC: 0 % (ref 0.0–0.2)

## 2022-05-15 LAB — COMPREHENSIVE METABOLIC PANEL
ALT: 17 U/L (ref 0–44)
AST: 29 U/L (ref 15–41)
Albumin: 3.8 g/dL (ref 3.5–5.0)
Alkaline Phosphatase: 42 U/L (ref 38–126)
Anion gap: 14 (ref 5–15)
BUN: <5 mg/dL — ABNORMAL LOW (ref 6–20)
CO2: 23 mmol/L (ref 22–32)
Calcium: 8.4 mg/dL — ABNORMAL LOW (ref 8.9–10.3)
Chloride: 109 mmol/L (ref 98–111)
Creatinine, Ser: 0.64 mg/dL (ref 0.61–1.24)
GFR, Estimated: >60 mL/min (ref >60)
Glucose, Bld: 129 mg/dL — ABNORMAL HIGH (ref 70–99)
Potassium: 3 mmol/L — ABNORMAL LOW (ref 3.5–5.1)
Sodium: 146 mmol/L — ABNORMAL HIGH (ref 135–145)
Total Bilirubin: 0.6 mg/dL (ref 0.3–1.2)
Total Protein: 6.5 g/dL (ref 6.5–8.1)

## 2022-05-15 LAB — SALICYLATE LEVEL: Salicylate Lvl: <7 mg/dL — ABNORMAL LOW (ref 7.0–30.0)

## 2022-05-15 LAB — ACETAMINOPHEN LEVEL: Acetaminophen (Tylenol), Serum: <10 ug/mL — ABNORMAL LOW (ref 10–30)

## 2022-05-15 LAB — ETHANOL: Alcohol, Ethyl (B): 392 mg/dL (ref <10)

## 2022-05-15 MED ORDER — LEVETIRACETAM IN NACL 1500 MG/100ML IV SOLN
1500.0000 mg | Freq: Once | INTRAVENOUS | Status: AC
  Administered 2022-05-16: 1500 mg via INTRAVENOUS
  Filled 2022-05-15: qty 100

## 2022-05-15 MED ORDER — LACTATED RINGERS IV BOLUS
1000.0000 mL | Freq: Once | INTRAVENOUS | Status: AC
  Administered 2022-05-16: 1000 mL via INTRAVENOUS

## 2022-05-15 NOTE — ED Triage Notes (Signed)
Pt BIB [facility], pt reports feeling suicidal and is also concerned he may have a seizure. Hasn't had his keppra in 4 days. Has also decreased his ETOH intake over the last few days in an effort to quit. En route, pt was agitated, given 5mg  haldol and 350ccNS.

## 2022-05-15 NOTE — ED Provider Notes (Signed)
[facility] EMERGENCY DEPARTMENT Provider Note   CSN: [id number] Arrival date & time: 05/15/22  2206     History {Add pertinent medical, surgical, social history, OB history to HPI:1} Chief Complaint  Patient presents with   Suicidal    Gary [name] is a 36 y.o. male.  36 yo M here for reported suicidal thoughts. Unable to fully interview patient 2/2 sedation from EMS. Nursing note from EMS:   "pt reports feeling suicidal and is also concerned he may have a seizure. Hasn't had his keppra in 4 days. Has also decreased his ETOH intake over the last few days in an effort to quit. En route, pt was agitated, given 5mg  haldol and 350ccNS"  Patient states he can't take his keppra because it is in a place he can't access.         Home Medications Prior to Admission medications   Medication Sig Start Date End Date Taking? Authorizing Provider  bictegravir-emtricitabine-tenofovir AF (BIKTARVY) 50-200-25 MG TABS tablet Take 1 tablet by mouth daily. 05/01/22   05/03/22, MD  folic acid (FOLVITE) 1 MG tablet Take 1 tablet (1 mg total) by mouth daily. 05/01/22   05/03/22, MD  gabapentin (NEURONTIN) 300 MG capsule Take 1 capsule (300 mg total) by mouth 3 (three) times daily. 05/01/22   05/03/22, MD  levETIRAcetam (KEPPRA) 500 MG tablet Take 1 tablet (500 mg total) by mouth 2 (two) times daily. 05/01/22   05/03/22, MD  Multiple Vitamin (MULTIVITAMIN WITH MINERALS) TABS tablet Take 1 tablet by mouth daily. 05/02/22   05/04/22, MD  ondansetron (ZOFRAN) 4 MG tablet Take 1 tablet (4 mg total) by mouth every 8 (eight) hours as needed for nausea or vomiting. 05/01/22   05/03/22, MD  pantoprazole (PROTONIX) 40 MG tablet Take 1 tablet (40 mg total) by mouth 2 (two) times daily. 05/01/22   05/03/22, MD  polyethylene glycol powder (GLYCOLAX/MIRALAX) 17 GM/SCOOP powder Use 1 capful (17 g) by mouth daily as needed for mild constipation. 05/01/22   05/03/22, MD  senna-docusate (SENOKOT-S) 8.6-50 MG tablet Take 1 tablet by mouth 2 (two) times daily. 05/01/22   05/03/22, MD  thiamine 100 MG tablet Take 1 tablet (100 mg total) by mouth daily. 05/01/22   05/03/22, MD  famotidine (PEPCID) 20 MG tablet Take 1 tablet (20 mg total) by mouth 2 (two) times daily. Patient not taking: Reported on 06/01/2019 05/14/19 06/02/19  06/04/19, MD      Allergies    Tegretol [carbamazepine] and Caffeine    Review of Systems   Review of Systems  Physical Exam Updated Vital Signs BP 105/66   Pulse 76   Temp 98.3 F (36.8 C) (Oral)   Resp 16   SpO2 96%  Physical Exam Vitals and nursing note reviewed.  Constitutional:      Appearance: He is well-developed.  HENT:     Head: Normocephalic and atraumatic.     Mouth/Throat:     Mouth: Mucous membranes are dry.  Eyes:     Pupils: Pupils are equal, round, and reactive to light.  Cardiovascular:     Rate and Rhythm: Normal rate.  Pulmonary:     Effort: Pulmonary effort is normal. No respiratory distress.  Abdominal:     General: Abdomen is flat. There is no distension.  Musculoskeletal:        General: Normal range of motion.     Cervical back: Normal range  of motion.  Skin:    General: Skin is warm and dry.  Neurological:     Mental Status: He is alert. He is disoriented.     ED Results / Procedures / Treatments   Labs (all labs ordered are listed, but only abnormal results are displayed) Labs Reviewed  COMPREHENSIVE METABOLIC PANEL - Abnormal; Notable for the following components:      Result Value   Sodium 146 (*)    Potassium 3.0 (*)    Glucose, Bld 129 (*)    BUN <5 (*)    Calcium 8.4 (*)    All other components within normal limits  CBC - Abnormal; Notable for the following components:   WBC 2.7 (*)    RBC 3.84 (*)    Hemoglobin 12.8 (*)    HCT 38.7 (*)    MCV 100.8 (*)    All other components within normal limits  ETHANOL  SALICYLATE LEVEL  ACETAMINOPHEN  LEVEL  RAPID URINE DRUG SCREEN, HOSP PERFORMED    EKG None  Radiology No results found.  Procedures Procedures  {Document cardiac monitor, telemetry assessment procedure when appropriate:1}  Medications Ordered in ED Medications - No data to display  ED Course/ Medical Decision Making/ A&P                           Medical Decision Making Amount and/or Complexity of Data Reviewed Labs: ordered.   Will check labs and allow him to wake up to obtain a more accurate history. Patient has been seen in the ED and by myself multiple times for SI and suicidal thoughts, it seems this is a chronic rumination. Will need to assess if there are any new factors. No indication for IVC currently. Will load Keppra and fluids, with Rx for keppra if discharged.   ***  {Document critical care time when appropriate:1} {Document review of labs and clinical decision tools ie heart score, Chads2Vasc2 etc:1}  {Document your independent review of radiology images, and any outside records:1} {Document your discussion with family members, caretakers, and with consultants:1} {Document social determinants of health affecting pt's care:1} {Document your decision making why or why not admission, treatments were needed:1} Final Clinical Impression(s) / ED Diagnoses Final diagnoses:  None    Rx / DC Orders ED Discharge Orders     None

## 2022-05-16 MED ORDER — CHLORDIAZEPOXIDE HCL 25 MG PO CAPS: ORAL_CAPSULE | ORAL | 0 refills | Status: DC

## 2022-05-20 ENCOUNTER — Emergency Department (HOSPITAL_COMMUNITY): Payer: Self-pay

## 2022-05-20 ENCOUNTER — Emergency Department (HOSPITAL_COMMUNITY)
Admission: EM | Admit: 2022-05-20 | Discharge: 2022-05-21 | Disposition: A | Discharge status: Home / Self Care | Payer: Self-pay | Attending: Emergency Medicine | Admitting: Emergency Medicine

## 2022-05-20 DIAGNOSIS — R45850 Homicidal ideations: Secondary | ICD-10-CM

## 2022-05-20 DIAGNOSIS — F17210 Nicotine dependence, cigarettes, uncomplicated: Secondary | ICD-10-CM | POA: Insufficient documentation

## 2022-05-20 DIAGNOSIS — F1024 Alcohol dependence with alcohol-induced mood disorder: Secondary | ICD-10-CM | POA: Insufficient documentation

## 2022-05-20 DIAGNOSIS — R4020 Unspecified coma: Secondary | ICD-10-CM

## 2022-05-20 DIAGNOSIS — F1020 Alcohol dependence, uncomplicated: Secondary | ICD-10-CM | POA: Diagnosis present

## 2022-05-20 DIAGNOSIS — F1094 Alcohol use, unspecified with alcohol-induced mood disorder: Secondary | ICD-10-CM | POA: Diagnosis present

## 2022-05-20 DIAGNOSIS — Z20822 Contact with and (suspected) exposure to covid-19: Secondary | ICD-10-CM | POA: Insufficient documentation

## 2022-05-20 DIAGNOSIS — F1010 Alcohol abuse, uncomplicated: Secondary | ICD-10-CM

## 2022-05-20 DIAGNOSIS — R45851 Suicidal ideations: Secondary | ICD-10-CM

## 2022-05-20 DIAGNOSIS — R55 Syncope and collapse: Secondary | ICD-10-CM | POA: Insufficient documentation

## 2022-05-20 DIAGNOSIS — Z21 Asymptomatic human immunodeficiency virus [HIV] infection status: Secondary | ICD-10-CM | POA: Insufficient documentation

## 2022-05-20 LAB — CBG MONITORING, ED: Glucose-Capillary: 78 mg/dL (ref 70–99)

## 2022-05-20 LAB — CBC WITH DIFFERENTIAL/PLATELET
Abs Immature Granulocytes: 0 K/uL (ref 0.00–0.07)
Basophils Absolute: 0 K/uL (ref 0.0–0.1)
Basophils Relative: 1 %
Eosinophils Absolute: 0.1 K/uL (ref 0.0–0.5)
Eosinophils Relative: 3 %
HCT: 33.8 % — ABNORMAL LOW (ref 39.0–52.0)
Hemoglobin: 11.6 g/dL — ABNORMAL LOW (ref 13.0–17.0)
Immature Granulocytes: 0 %
Lymphocytes Relative: 52 %
Lymphs Abs: 1.5 K/uL (ref 0.7–4.0)
MCH: 33.7 pg (ref 26.0–34.0)
MCHC: 34.3 g/dL (ref 30.0–36.0)
MCV: 98.3 fL (ref 80.0–100.0)
Monocytes Absolute: 0.2 K/uL (ref 0.1–1.0)
Monocytes Relative: 7 %
Neutro Abs: 1.1 K/uL — ABNORMAL LOW (ref 1.7–7.7)
Neutrophils Relative %: 37 %
Platelets: 161 K/uL (ref 150–400)
RBC: 3.44 MIL/uL — ABNORMAL LOW (ref 4.22–5.81)
RDW: 13.9 % (ref 11.5–15.5)
WBC: 2.9 K/uL — ABNORMAL LOW (ref 4.0–10.5)
nRBC: 0 % (ref 0.0–0.2)

## 2022-05-20 LAB — COMPREHENSIVE METABOLIC PANEL WITH GFR
ALT: 18 U/L (ref 0–44)
AST: 28 U/L (ref 15–41)
Albumin: 3.4 g/dL — ABNORMAL LOW (ref 3.5–5.0)
Alkaline Phosphatase: 46 U/L (ref 38–126)
Anion gap: 10 (ref 5–15)
BUN: 8 mg/dL (ref 6–20)
CO2: 23 mmol/L (ref 22–32)
Calcium: 8 mg/dL — ABNORMAL LOW (ref 8.9–10.3)
Chloride: 111 mmol/L (ref 98–111)
Creatinine, Ser: 0.8 mg/dL (ref 0.61–1.24)
GFR, Estimated: >60 mL/min
Glucose, Bld: 77 mg/dL (ref 70–99)
Potassium: 4.1 mmol/L (ref 3.5–5.1)
Sodium: 144 mmol/L (ref 135–145)
Total Bilirubin: 0.6 mg/dL (ref 0.3–1.2)
Total Protein: 6.2 g/dL — ABNORMAL LOW (ref 6.5–8.1)

## 2022-05-20 LAB — RESP PANEL BY RT-PCR (FLU A&B, COVID) ARPGX2
Influenza A by PCR: NEGATIVE
Influenza B by PCR: NEGATIVE
SARS Coronavirus 2 by RT PCR: NEGATIVE

## 2022-05-20 LAB — AMMONIA: Ammonia: 15 umol/L (ref 9–35)

## 2022-05-20 LAB — SALICYLATE LEVEL: Salicylate Lvl: <7 mg/dL — ABNORMAL LOW (ref 7.0–30.0)

## 2022-05-20 LAB — ACETAMINOPHEN LEVEL: Acetaminophen (Tylenol), Serum: <10 ug/mL — ABNORMAL LOW (ref 10–30)

## 2022-05-20 LAB — ETHANOL: Alcohol, Ethyl (B): 283 mg/dL — ABNORMAL HIGH

## 2022-05-20 MED ORDER — THIAMINE HCL 100 MG PO TABS
100.0000 mg | ORAL_TABLET | Freq: Every day | ORAL | Status: DC
  Administered 2022-05-21: 100 mg via ORAL

## 2022-05-20 MED ORDER — FOLIC ACID 1 MG PO TABS
1.0000 mg | ORAL_TABLET | Freq: Every day | ORAL | Status: DC
  Administered 2022-05-21: 1 mg via ORAL

## 2022-05-20 MED ORDER — THIAMINE HCL 100 MG/ML IJ SOLN: 100.0000 mg | Freq: Every day | INTRAMUSCULAR | Status: DC

## 2022-05-20 MED ORDER — POLYETHYLENE GLYCOL 3350 17 G PO PACK: 17.0000 g | PACK | Freq: Every day | ORAL | Status: DC | PRN

## 2022-05-20 MED ORDER — LORAZEPAM 2 MG/ML IJ SOLN
0.0000 mg | Freq: Four times a day (QID) | INTRAMUSCULAR | Status: DC
  Administered 2022-05-20: 2 mg via INTRAVENOUS

## 2022-05-20 MED ORDER — THIAMINE HCL 100 MG PO TABS
100.0000 mg | ORAL_TABLET | Freq: Every day | ORAL | Status: DC
  Administered 2022-05-20: 100 mg via ORAL

## 2022-05-20 MED ORDER — SENNOSIDES-DOCUSATE SODIUM 8.6-50 MG PO TABS: 1.0000 | ORAL_TABLET | Freq: Two times a day (BID) | ORAL | Status: DC

## 2022-05-20 MED ORDER — GABAPENTIN 300 MG PO CAPS
300.0000 mg | ORAL_CAPSULE | Freq: Three times a day (TID) | ORAL | Status: DC
  Administered 2022-05-20 – 2022-05-21 (×2): 300 mg via ORAL

## 2022-05-20 MED ORDER — LORAZEPAM 1 MG PO TABS: 0.0000 mg | ORAL_TABLET | Freq: Two times a day (BID) | ORAL | Status: DC

## 2022-05-20 MED ORDER — BICTEGRAVIR-EMTRICITAB-TENOFOV 50-200-25 MG PO TABS
1.0000 | ORAL_TABLET | Freq: Every day | ORAL | Status: DC
  Administered 2022-05-21: 1 via ORAL
  Filled 2022-05-20: qty 1

## 2022-05-20 MED ORDER — LORAZEPAM 1 MG PO TABS
0.0000 mg | ORAL_TABLET | Freq: Four times a day (QID) | ORAL | Status: DC
  Administered 2022-05-21 (×2): 1 mg via ORAL

## 2022-05-20 MED ORDER — LEVETIRACETAM 500 MG PO TABS
500.0000 mg | ORAL_TABLET | Freq: Two times a day (BID) | ORAL | Status: DC
  Administered 2022-05-20 – 2022-05-21 (×2): 500 mg via ORAL

## 2022-05-20 MED ORDER — LORAZEPAM 2 MG/ML IJ SOLN: 0.0000 mg | Freq: Two times a day (BID) | INTRAMUSCULAR | Status: DC

## 2022-05-20 MED ORDER — ADULT MULTIVITAMIN W/MINERALS CH: 1.0000 | ORAL_TABLET | Freq: Every day | ORAL | Status: DC

## 2022-05-20 MED ORDER — PANTOPRAZOLE SODIUM 40 MG PO TBEC
40.0000 mg | DELAYED_RELEASE_TABLET | Freq: Two times a day (BID) | ORAL | Status: DC
  Administered 2022-05-20 – 2022-05-21 (×2): 40 mg via ORAL

## 2022-05-20 NOTE — ED Provider Notes (Signed)
[facility] EMERGENCY DEPARTMENT Provider Note   CSN: [id number] Arrival date & time: 05/20/22  1923     History  Chief Complaint  Patient presents with   Withdrawal   Suicidal   Homicidal    Gary [name] is a 36 y.o. male.  HPI  36 year old male with medical history significant for HIV, alcohol abuse and alcohol withdrawal seizures who presents to the emergency department with SI and HI and a reported seizure.  The patient states that he drank 1 bottle of wine today but typically drinks much more than that.  He states that he got into an argument over drinking and states that he may have "blacked out" versus had a seizure today.  EMS did administer 2.5 mg of midazolam IM in route.  The patient endorses SI with a plan to overdose.  He also endorses HI actively.  He denies any AVH.  He states that he has not been on his HIV medications or antiepileptics for the past week.  Home Medications Prior to Admission medications   Medication Sig Start Date End Date Taking? Authorizing Provider  bictegravir-emtricitabine-tenofovir AF (BIKTARVY) 50-200-25 MG TABS tablet Take 1 tablet by mouth daily. 05/01/22   Kathlen Mody, MD  chlordiazePOXIDE (LIBRIUM) 25 MG capsule 50mg  PO TID x 1D, then 25-50mg  PO BID X 1D, then 25-50mg  PO QD X 1D 05/16/22   Mesner, 07/17/22, MD  folic acid (FOLVITE) 1 MG tablet Take 1 tablet (1 mg total) by mouth daily. 05/01/22   05/03/22, MD  gabapentin (NEURONTIN) 300 MG capsule Take 1 capsule (300 mg total) by mouth 3 (three) times daily. 05/01/22   05/03/22, MD  levETIRAcetam (KEPPRA) 500 MG tablet Take 1 tablet (500 mg total) by mouth 2 (two) times daily. 05/01/22   05/03/22, MD  Multiple Vitamin (MULTIVITAMIN WITH MINERALS) TABS tablet Take 1 tablet by mouth daily. 05/02/22   05/04/22, MD  ondansetron (ZOFRAN) 4 MG tablet Take 1 tablet (4 mg total) by mouth every 8 (eight) hours as needed for nausea or vomiting. 05/01/22   05/03/22,  MD  pantoprazole (PROTONIX) 40 MG tablet Take 1 tablet (40 mg total) by mouth 2 (two) times daily. 05/01/22   05/03/22, MD  polyethylene glycol powder (GLYCOLAX/MIRALAX) 17 GM/SCOOP powder Use 1 capful (17 g) by mouth daily as needed for mild constipation. 05/01/22   05/03/22, MD  senna-docusate (SENOKOT-S) 8.6-50 MG tablet Take 1 tablet by mouth 2 (two) times daily. 05/01/22   05/03/22, MD  thiamine 100 MG tablet Take 1 tablet (100 mg total) by mouth daily. 05/01/22   05/03/22, MD  famotidine (PEPCID) 20 MG tablet Take 1 tablet (20 mg total) by mouth 2 (two) times daily. Patient not taking: Reported on 06/01/2019 05/14/19 06/02/19  06/04/19, MD      Allergies    Tegretol [carbamazepine] and Caffeine    Review of Systems   Review of Systems  All other systems reviewed and are negative.   Physical Exam Updated Vital Signs BP 113/78   Pulse 84   Temp 98.3 F (36.8 C) (Oral)   Resp 19   SpO2 98%  Physical Exam Vitals and nursing note reviewed.  Constitutional:      General: He is not in acute distress.    Appearance: He is well-developed.  HENT:     Head: Normocephalic and atraumatic.  Eyes:     Conjunctiva/sclera: Conjunctivae normal.  Cardiovascular:  Rate and Rhythm: Normal rate and regular rhythm.  Pulmonary:     Effort: Pulmonary effort is normal. No respiratory distress.     Breath sounds: Normal breath sounds.  Abdominal:     Palpations: Abdomen is soft.     Tenderness: There is no abdominal tenderness.  Musculoskeletal:        General: No swelling.     Cervical back: Neck supple.  Skin:    General: Skin is warm and dry.     Capillary Refill: Capillary refill takes less than 2 seconds.  Neurological:     Mental Status: He is alert.     Comments: MENTAL STATUS EXAM:    Orientation: Alert and oriented to person, place and time.  Memory: Cooperative, follows commands well.  Language: Speech is clear and language is normal.   CRANIAL  NERVES:    CN 2 (Optic): Visual fields intact to confrontation.  CN 3,4,6 (EOM): Pupils equal and reactive to light. Full extraocular eye movement without nystagmus.  CN 5 (Trigeminal): Facial sensation is normal, no weakness of masticatory muscles.  CN 7 (Facial): No facial weakness or asymmetry.  CN 8 (Auditory): Auditory acuity grossly normal.  CN 9,10 (Glossophar): The uvula is midline, the palate elevates symmetrically.  CN 11 (spinal access): Normal sternocleidomastoid and trapezius strength.  CN 12 (Hypoglossal): The tongue is midline. No atrophy or fasciculations.Marland Kitchen   MOTOR:  Muscle Strength: 5/5RUE, 5/5LUE, 5/5RLE, 5/5LLE.   COORDINATION:   Intact finger-to-nose, no tremor.   SENSATION:   Intact to light touch all four extremities.  GAIT: Gait not assessed.   Psychiatric:        Mood and Affect: Mood normal.     ED Results / Procedures / Treatments   Labs (all labs ordered are listed, but only abnormal results are displayed) Labs Reviewed  CBC WITH DIFFERENTIAL/PLATELET - Abnormal; Notable for the following components:      Result Value   WBC 2.9 (*)    RBC 3.44 (*)    Hemoglobin 11.6 (*)    HCT 33.8 (*)    Neutro Abs 1.1 (*)    All other components within normal limits  COMPREHENSIVE METABOLIC PANEL - Abnormal; Notable for the following components:   Calcium 8.0 (*)    Total Protein 6.2 (*)    Albumin 3.4 (*)    All other components within normal limits  ETHANOL - Abnormal; Notable for the following components:   Alcohol, Ethyl (B) 283 (*)    All other components within normal limits  ACETAMINOPHEN LEVEL - Abnormal; Notable for the following components:   Acetaminophen (Tylenol), Serum <10 (*)    All other components within normal limits  SALICYLATE LEVEL - Abnormal; Notable for the following components:   Salicylate Lvl <7.0 (*)    All other components within normal limits  RESP PANEL BY RT-PCR (FLU A&B, COVID) [id number]  AMMONIA  HIV-1 RNA QUANT-NO  REFLEX-BLD  TSH  CBG MONITORING, ED    EKG EKG Interpretation  Date/Time:  Wednesday May 20 2022 19:34:38 EDT Ventricular Rate:  96 PR Interval:  154 QRS Duration: 100 QT Interval:  389 QTC Calculation: 492 R Axis:   77 Text Interpretation: Sinus rhythm Prolonged QT interval Confirmed by Ernie Avena ([id number]) on 05/20/2022 8:20:39 PM  Radiology DG Chest Portable 1 View  Result Date: 05/20/2022 CLINICAL DATA:  Altered mental status EXAM: PORTABLE CHEST 1 VIEW COMPARISON:  12/31/2021, 04/25/2022 FINDINGS: Chronic elevation left diaphragm. No acute airspace disease or effusion. Normal  cardiac size. No pneumothorax IMPRESSION: No active disease.  Chronic elevation of left diaphragm Electronically Signed   By: Jasmine Pang M.D.   On: 05/20/2022 21:13   CT HEAD WO CONTRAST ( )  Result Date: 05/20/2022 CLINICAL DATA:  Altered mental status EXAM: CT HEAD WITHOUT CONTRAST TECHNIQUE: Contiguous axial images were obtained from the base of the skull through the vertex without intravenous contrast. RADIATION DOSE REDUCTION: This exam was performed according to the departmental dose-optimization program which includes automated exposure control, adjustment of the mA and/or kV according to patient size and/or use of iterative reconstruction technique. COMPARISON:  04/16/2022 FINDINGS: Brain: No evidence of acute infarction, hemorrhage, hydrocephalus, extra-axial collection or mass lesion/mass effect. Mild subcortical white matter and periventricular small vessel ischemic changes. Vascular: No hyperdense vessel or unexpected calcification. Skull: Normal. Negative for fracture or focal lesion. Sinuses/Orbits: The visualized paranasal sinuses are essentially clear. The mastoid air cells are unopacified. Other: None. IMPRESSION: No evidence of acute intracranial abnormality. Mild small vessel ischemic changes. Electronically Signed   By: Charline Bills M.D.   On: 05/20/2022 21:11     Procedures Procedures    Medications Ordered in ED Medications  LORazepam (ATIVAN) injection 0-4 mg (2 mg Intravenous Given 05/20/22 2027)    Or  LORazepam (ATIVAN) tablet 0-4 mg ( Oral See Alternative 05/20/22 2027)  LORazepam (ATIVAN) injection 0-4 mg (has no administration in time range)    Or  LORazepam (ATIVAN) tablet 0-4 mg (has no administration in time range)  bictegravir-emtricitabine-tenofovir AF (BIKTARVY) 50-200-25 MG per tablet 1 tablet (has no administration in time range)  folic acid (FOLVITE) tablet 1 mg (has no administration in time range)  gabapentin (NEURONTIN) capsule 300 mg (has no administration in time range)  levETIRAcetam (KEPPRA) tablet 500 mg (has no administration in time range)  multivitamin with minerals tablet 1 tablet (has no administration in time range)  pantoprazole (PROTONIX) EC tablet 40 mg (has no administration in time range)  polyethylene glycol (MIRALAX / GLYCOLAX) packet 17 g (has no administration in time range)  senna-docusate (Senokot-S) tablet 1 tablet (has no administration in time range)  thiamine tablet 100 mg (has no administration in time range)    ED Course/ Medical Decision Making/ A&P Clinical Course as of 05/20/22 2255  Wed May 20, 2022  2248 Alcohol, Ethyl (B)(!): 283 [JL]    Clinical Course User Index [JL] Ernie Avena, MD                           Medical Decision Making Amount and/or Complexity of Data Reviewed Labs: ordered. Decision-making details documented in ED Course. Radiology: ordered.  Risk OTC drugs. Prescription drug management.   36 year old male with medical history significant for HIV, alcohol abuse and alcohol withdrawal seizures who presents to the emergency department with SI and HI and a reported seizure.  The patient states that he drank 1 bottle of wine today but typically drinks much more than that.  He states that he got into an argument over drinking and states that he may have "blacked  out" versus had a seizure today.  EMS did administer 2.5 mg of midazolam IM in route.  The patient endorses SI with a plan to overdose.  He also endorses HI actively.  He denies any AVH.  He states that he has not been on his HIV medications or antiepileptics for the past week.  On arrival, the patient was vitally stable.  Not tachycardic or  tachypneic, hemodynamically stable.  Sinus rhythm noted on cardiac telemetry.  Patient endorsing SI and HI in the setting of alcohol abuse.  Reportedly had an alcohol withdrawal seizure versus syncope.  I question true seizure versus syncope in the setting of alcohol use.  CBG 80 in route with EMS.  Work-up initiated given the patient's history of HIV, being off his medications, history of alcohol abuse and concern for withdrawal seizure.  No clear head trauma.  The patient is neurologically intact.  An ammonia level was normal, CBG on arrival was 78, Tylenol salicylate levels were normal.  An ethanol level was found to be elevated at 283.  A CBC was with leukopenia to 2.9, mild anemia to 11.6, CMP without significant electrolyte abnormality, HIV RNA collected and pending.  COVID-19 influenza PCR testing was negative.  A chest x-ray revealed no evidence of aspiration with no focal cardiopulmonary abnormality.  A CT head was without acute disease.  With the patient being neurologically intact notes baseline, no clear evidence for alcohol withdrawal on my exam, I feel that the patient is medically cleared at this time for psychiatric evaluation given his ongoing SI and HI. TTS consult placed and home medications reordered.  Final Clinical Impression(s) / ED Diagnoses Final diagnoses:  Suicidal ideation  Homicidal ideation  Alcohol abuse  Loss of consciousness (HCC)    Rx / DC Orders ED Discharge Orders     None         Ernie Avena, MD 05/20/22 2255

## 2022-05-20 NOTE — ED Triage Notes (Signed)
Pt to ED via EMS. Pt c/o alcohol withdrawals. Pt states he drank one bottle of wine today, but states he typically drinks 10 times that amount. pt states he thinks he is having alcohol withdrawal seizures and reports "blacking out" frequently today. EMS gave 2.5 of midazolam en route. Pt c/o overall generalized pain. Pt also requesting psych eval. When pt asked if he has any SI, pt states "I wish I could drink so much that everything just goes away." Pt endorses SI. Pt also endorses HI. Pt states he has thought to harm people downtown and wants to hurt them.     EMS Vitals:  110/70 100 HR 80 CBG 20L hand

## 2022-05-21 ENCOUNTER — Emergency Department (HOSPITAL_COMMUNITY)
Admission: EM | Admit: 2022-05-21 | Discharge: 2022-05-22 | Disposition: A | Discharge status: Home / Self Care | Payer: Self-pay | Attending: Emergency Medicine | Admitting: Emergency Medicine

## 2022-05-21 ENCOUNTER — Other Ambulatory Visit: Payer: Self-pay

## 2022-05-21 ENCOUNTER — Encounter (HOSPITAL_COMMUNITY): Payer: Self-pay

## 2022-05-21 DIAGNOSIS — F10930 Alcohol use, unspecified with withdrawal, uncomplicated: Secondary | ICD-10-CM

## 2022-05-21 DIAGNOSIS — R441 Visual hallucinations: Secondary | ICD-10-CM | POA: Insufficient documentation

## 2022-05-21 DIAGNOSIS — Y908 Blood alcohol level of 240 mg/100 ml or more: Secondary | ICD-10-CM | POA: Insufficient documentation

## 2022-05-21 DIAGNOSIS — R1110 Vomiting, unspecified: Secondary | ICD-10-CM | POA: Insufficient documentation

## 2022-05-21 DIAGNOSIS — Z79899 Other long term (current) drug therapy: Secondary | ICD-10-CM | POA: Insufficient documentation

## 2022-05-21 DIAGNOSIS — F10120 Alcohol abuse with intoxication, uncomplicated: Secondary | ICD-10-CM | POA: Insufficient documentation

## 2022-05-21 MED ORDER — ACETAMINOPHEN 325 MG PO TABS
650.0000 mg | ORAL_TABLET | Freq: Four times a day (QID) | ORAL | Status: DC | PRN
  Administered 2022-05-21: 650 mg via ORAL

## 2022-05-21 NOTE — BH Assessment (Signed)
TTS clinician attempted to complete assessment. Per Corrie Dandy, RN, high BAL earlier and recently given ativan, patient is asleep. TTS to attempt at later time.

## 2022-05-21 NOTE — Progress Notes (Signed)
Inpatient and outpatient substance abuse resources added to patients AVS. Shelter resources also attached.

## 2022-05-21 NOTE — ED Notes (Signed)
Pt dressed out in purple scrubs; belongings placed in locker 14

## 2022-05-21 NOTE — Consult Note (Signed)
Telepsych Consultation   Reason for Consult:  Psychiatry provider reassessment Referring Physician:  Emergency department physician, Dr Roslynn Amble Location of Patient: [name] Location of Provider: [facility] [facility]  Patient Identification: Gary [name] MRN:  [id number] Principal Diagnosis: Alcohol use disorder, severe, dependence ([place]) Diagnosis:  Principal Problem:   Alcohol use disorder, severe, dependence ([place]) Active Problems:   Alcohol-induced mood disorder ([place])   Total Time spent with patient: 45 minutes  Subjective:   Gary [name] is a 36 y.o. male patient.  Patient states "if I can get alcohol detox things would be a lot better, things would be fine.  I have got serious problems with alcohol."  HPI: Patient is reassessed, virtually, via telepsychiatry monitor, by nurse practitioner. Gary [name] is seated on hospital stretcher, he is alert and oriented, pleasant and cooperative during assessment.  Patient presents with depressed mood, congruent affect.  He denies suicidal and homicidal ideations currently.  Reports most recently experienced suicidal ideation on last night.  Denies plan or intent to complete suicide.  He easily contracts verbally for safety with this Probation officer.  Gary [name] denies auditory and visual hallucinations currently.  He reports history of visual hallucinations, reports history of "seeing something in a corner."  He denies command hallucinations.  He denies symptoms of paranoia.  There is no evidence of delusional thought content and no indication that patient is responding to internal stimuli.  Gary [name] reports he went into a restaurant on yesterday to ask for a call to EMS, so that he "could get to the emergency room and get help with alcohol detox."  Patient reports ongoing daily alcohol use for approximately 3 years.  He reports he typically consumes approximately 15 twelve ounce beers daily.  He reports history of alcohol-related  seizure.  Patient reports he would like to stop using alcohol, feels he requires 5 days "of detox in the emergency department" prior to seeking residential substance use treatment.  He most recently sought residential substance use treatment at [facility] in [place], [place] 2 years ago.  He reports 14 days of sobriety at that time prior to relapse on alcohol.  He denies substance use aside from alcohol.   Gary [name] has been diagnosed with major depressive disorder, bipolar disorder as well as alcohol use disorder and substance-induced mood disorder.  He also has a history of amphetamine abuse, polysubstance abuse and suicidal ideation.  He is not currently linked with outpatient psychiatry, denies current medications to address mood.  He prefers not to follow-up with outpatient psychiatry.  He endorses history of multiple previous inpatient psychiatric hospitalizations.  No family mental health history reported.  Gary [name] shares recent stressors include inability to take prescribed medications, including Keppra.  His medications were placed under a bridge, by a friend, so that he would not have to drag his belongings and medications around with him.  He is unable to reach medication under bridge as he cannot climb beneath the bridge.  Current plan includes asking friend who placed medications under the bridge to retrieve the medications.  He is also willing to apply for assistance with [facility] to attempt to get medications at at reduced cost.  He currently cannot afford to refill medications, reports he is typically provided all medications at emergency department.  Chronic stressors include homelessness.  He has been homeless for several months.  Additional stressors include chronic pain, reports current pain to arms knees and chest.  This pain has been going  on for several weeks.  Albert is currently homeless in [place], denies access to weapons.  He is not currently employed.   He endorses decreased sleep and appetite.  Patient offered support and encouragement.  20 shares that he has close friends in [place] who are his primary emotional supports.  Declines any person to contact for collateral information at this time.   Patient educated and verbalizes understanding of mental health resources and other crisis services in the community. They are instructed to call 911 and present to the nearest emergency room should patient experience any suicidal/homicidal ideation, auditory/visual/hallucinations, or detrimental worsening of mental health condition.    Past Psychiatric History: Major depressive disorder, bipolar disorder, alcohol use disorder, substance-induced mood disorder, amphetamine abuse, polysubstance abuse, suicidal ideation  Risk to Self: Denies Risk to Others: Denies Prior Inpatient Therapy: [facility] 07/2021 Prior Outpatient Therapy:   None reported  Past Medical History:  Past Medical History:  Diagnosis Date   Alcohol abuse    Anxiety    Bipolar 2 disorder (HCC)    HIV (human immunodeficiency virus infection) (HCC)    PTSD (post-traumatic stress disorder)    Schizophrenia (HCC)    Seizures (HCC)    Subdural hematoma (HCC)     Past Surgical History:  Procedure Laterality Date   BIOPSY  04/19/2022   Procedure: BIOPSY;  Surgeon: Lemar Lofty., MD;  Location[facility] ENDOSCOPY;  Service: Gastroenterology;;   ENTEROSCOPY N/A 04/19/2022   Procedure: ENTEROSCOPY;  Surgeon: Lemar Lofty., MD;  Location: [facility] ENDOSCOPY;  Service: Gastroenterology;  Laterality: N/A;   INCISION AND DRAINAGE PERIRECTAL ABSCESS N/A 09/24/2016   Procedure: IRRIGATION AND DEBRIDEMENT PERIRECTAL ABSCESS;  Surgeon: Ricarda Frame, MD;  Location: [facility] ORS;  Service: General;  Laterality: N/A;   none     Family History:  Family History  Problem Relation Age of Onset   Alcohol abuse Mother    Alcohol abuse Father    Colon cancer Other    Other  Other    Cancer Other    Family Psychiatric  History: none reported Social History:  Social History   Substance and Sexual Activity  Alcohol Use Yes   Alcohol/week: 105.0 standard drinks of alcohol   Types: 105 Cans of beer per week   Comment: 15 beers/day (105 beers/week), 1 fifth of liquor per day (7/week)     Social History   Substance and Sexual Activity  Drug Use Not Currently   Types: Methamphetamines, Marijuana   Comment: last used 04/10/2019    Social History   Socioeconomic History   Marital status: Single    Spouse name: Not on file   Number of children: Not on file   Years of education: Not on file   Highest education level: Not on file  Occupational History   Occupation: unemployed  Tobacco Use   Smoking status: Every Day    Packs/day: 0.50    Types: Cigarettes   Smokeless tobacco: Never   Tobacco comments:    unable to smoke while incarcerated 6+ months 02/13/20  Vaping Use   Vaping Use: Never used  Substance and Sexual Activity   Alcohol use: Yes    Alcohol/week: 105.0 standard drinks of alcohol    Types: 105 Cans of beer per week    Comment: 15 beers/day (105 beers/week), 1 fifth of liquor per day (7/week)   Drug use: Not Currently    Types: Methamphetamines, Marijuana    Comment: last used 04/10/2019   Sexual activity: Yes  Partners: Male, Male    Comment: declined condoms  03/2021  Other Topics Concern   Not on file  Social History Narrative   "Currently living on the streets"   Independent at baseline   [facility]asionally goes [facility] the [facility]   Social Determinants of Health   Financial Resource Strain: Not on file  Food Insecurity: Food Insecurity Present (04/27/2022)   Hunger Vital Sign    Worried About Running Out of Food in the Last Year: Sometimes true    Ran Out of Food in the Last Year: Sometimes true  Transportation Needs: Unmet Transportation Needs (04/27/2022)   PRAPARE - Hydrologist (Medical): Yes    Lack of  Transportation (Non-Medical): Yes  Physical Activity: Not on file  Stress: Not on file  Social Connections: Not on file   Additional Social History:    Allergies:   Allergies  Allergen Reactions   Tegretol [Carbamazepine] Other (See Comments)    Caused vertigo for 2 days after taking it   Caffeine Palpitations    Labs:  Results for orders placed or performed during the hospital encounter of 05/20/22 (from the past 48 hour(s))  Resp Panel by RT-PCR (Flu A&B, Covid) Anterior Nasal Swab     Status: None   Collection Time: 05/20/22  8:19 PM   Specimen: Anterior Nasal Swab  Result Value Ref Range   SARS Coronavirus 2 by RT PCR NEGATIVE NEGATIVE    Comment: (NOTE) SARS-CoV-2 target nucleic acids are NOT DETECTED.  The SARS-CoV-2 RNA is generally detectable in upper respiratory specimens during the acute phase of infection. The lowest concentration of SARS-CoV-2 viral copies this assay can detect is 138 copies/mL. A negative result does not preclude SARS-Cov-2 infection and should not be used as the sole basis for treatment or other patient management decisions. A negative result may occur with  improper specimen collection/handling, submission of specimen other than nasopharyngeal swab, presence of viral mutation(s) within the areas targeted by this assay, and inadequate number of viral copies(<138 copies/mL). A negative result must be combined with clinical observations, patient history, and epidemiological information. The expected result is Negative.  Fact Sheet for Patients:  EntrepreneurPulse.com.au  Fact Sheet for Healthcare Providers:  IncredibleEmployment.be  This test is no t yet approved or cleared by the [place] FDA and  has been authorized for detection and/or diagnosis of SARS-CoV-2 by FDA under an Emergency Use Authorization (EUA). This EUA will remain  in effect (meaning this test can be used) for the duration of  the COVID-19 declaration under Section 564(b)(1) of the Act, 21 U.S.C.section 360bbb-3(b)(1), unless the authorization is terminated  or revoked sooner.       Influenza A by PCR NEGATIVE NEGATIVE   Influenza B by PCR NEGATIVE NEGATIVE    Comment: (NOTE) The Xpert Xpress SARS-CoV-2/FLU/RSV plus assay is intended as an aid in the diagnosis of influenza from Nasopharyngeal swab specimens and should not be used as a sole basis for treatment. Nasal washings and aspirates are unacceptable for Xpert Xpress SARS-CoV-2/FLU/RSV testing.  Fact Sheet for Patients: EntrepreneurPulse.com.au  Fact Sheet for Healthcare Providers: IncredibleEmployment.be  This test is not yet approved or cleared by the [place] FDA and has been authorized for detection and/or diagnosis of SARS-CoV-2 by FDA under an Emergency Use Authorization (EUA). This EUA will remain in effect (meaning this test can be used) for the duration of the COVID-19 declaration under Section 564(b)(1) of the Act, 21 U.S.C. section 360bbb-3(b)(1), unless the  authorization is terminated or revoked.  Performed at [facility], [place] [address], [place], [place] [address]   CBC with Differential     Status: Abnormal   Collection Time: 05/20/22  8:20 PM  Result Value Ref Range   WBC 2.9 (L) 4.0 - 10.5 K/uL   RBC 3.44 (L) 4.22 - 5.81 MIL/uL   Hemoglobin 11.6 (L) 13.0 - 17.0 g/dL   HCT 33.8 (L) 39.0 - 52.0 %   MCV 98.3 80.0 - 100.0 fL   MCH 33.7 26.0 - 34.0 pg   MCHC 34.3 30.0 - 36.0 g/dL   RDW 13.9 11.5 - 15.5 %   Platelets 161 150 - 400 K/uL   nRBC 0.0 0.0 - 0.2 %   Neutrophils Relative % 37 %   Neutro Abs 1.1 (L) 1.7 - 7.7 K/uL   Lymphocytes Relative 52 %   Lymphs Abs 1.5 0.7 - 4.0 K/uL   Monocytes Relative 7 %   Monocytes Absolute 0.2 0.1 - 1.0 K/uL   Eosinophils Relative 3 %   Eosinophils Absolute 0.1 0.0 - 0.5 K/uL   Basophils Relative 1 %   Basophils Absolute 0.0 0.0 - 0.1  K/uL   Immature Granulocytes 0 %   Abs Immature Granulocytes 0.00 0.00 - 0.07 K/uL    Comment: Performed at [facility], [place] [address] [place], [place] [address]  Comprehensive metabolic panel     Status: Abnormal   Collection Time: 05/20/22  8:20 PM  Result Value Ref Range   Sodium 144 135 - 145 mmol/L   Potassium 4.1 3.5 - 5.1 mmol/L   Chloride 111 98 - 111 mmol/L   CO2 23 22 - 32 mmol/L   Glucose, Bld 77 70 - 99 mg/dL    Comment: Glucose reference range applies only to samples taken after fasting for at least 8 hours.   BUN 8 6 - 20 mg/dL   Creatinine, Ser 0.80 0.61 - 1.24 mg/dL   Calcium 8.0 (L) 8.9 - 10.3 mg/dL   Total Protein 6.2 (L) 6.5 - 8.1 g/dL   Albumin 3.4 (L) 3.5 - 5.0 g/dL   AST 28 15 - 41 U/L   ALT 18 0 - 44 U/L   Alkaline Phosphatase 46 38 - 126 U/L   Total Bilirubin 0.6 0.3 - 1.2 mg/dL   GFR, Estimated >60 >60 mL/min    Comment: (NOTE) Calculated using the CKD-EPI Creatinine Equation (2021)    Anion gap 10 5 - 15    Comment: Performed at [facility], [place] [address] [place], [place] [address]  Ethanol     Status: Abnormal   Collection Time: 05/20/22  8:20 PM  Result Value Ref Range   Alcohol, Ethyl (B) 283 (H) <10 mg/dL    Comment: (NOTE) Lowest detectable limit for serum alcohol is 10 mg/dL.  For medical purposes only. Performed at [facility], [place] [address], [place], [place] [address]   Acetaminophen level     Status: Abnormal   Collection Time: 05/20/22  8:20 PM  Result Value Ref Range   Acetaminophen (Tylenol), Serum <10 (L) 10 - 30 ug/mL    Comment: (NOTE) Therapeutic concentrations vary significantly. A range of 10-30 ug/mL  may be an effective concentration for many patients. However, some  are best treated at concentrations outside of this range. Acetaminophen concentrations >150 ug/mL at 4 hours after ingestion  and >50 ug/mL at 12 hours after ingestion are often associated with  toxic  reactions.  Performed at [facility], [place] [address], [place], [place] [id number]   Salicylate level     Status: Abnormal   Collection Time: 05/20/22  8:20 PM  Result Value Ref Range   Salicylate Lvl Q000111Q (L) 7.0 - 30.0 mg/dL    Comment: Performed at [facility] [address], [place], [place] [address]  CBG monitoring, ED     Status: None   Collection Time: 05/20/22  8:22 PM  Result Value Ref Range   Glucose-Capillary 78 70 - 99 mg/dL    Comment: Glucose reference range applies only to samples taken after fasting for at least 8 hours.  Ammonia     Status: None   Collection Time: 05/20/22  9:49 PM  Result Value Ref Range   Ammonia 15 9 - 35 umol/L    Comment: Performed at [facility], [place] [address], [place], [place] [address]    Medications:  Current Facility-Administered Medications  Medication Dose Route Frequency Provider Last Rate Last Admin   acetaminophen (TYLENOL) tablet 650 mg  650 mg Oral Q6H PRN Lucrezia Starch, MD   650 mg at 05/21/22 0732   bictegravir-emtricitabine-tenofovir AF (BIKTARVY) 50-200-25 MG per tablet 1 tablet  1 tablet Oral Daily Regan Lemming, MD       folic acid (FOLVITE) tablet 1 mg  1 mg Oral Daily Regan Lemming, MD       gabapentin (NEURONTIN) capsule 300 mg  300 mg Oral TID Regan Lemming, MD   300 mg at 05/20/22 2301   levETIRAcetam (KEPPRA) tablet 500 mg  500 mg Oral BID Regan Lemming, MD   500 mg at 05/20/22 2301   LORazepam (ATIVAN) injection 0-4 mg  0-4 mg Intravenous Q6H Regan Lemming, MD   2 mg at 05/20/22 2027   Or   LORazepam (ATIVAN) tablet 0-4 mg  0-4 mg Oral Q6H Regan Lemming, MD   1 mg at 05/21/22 0732   [START ON 05/23/2022] LORazepam (ATIVAN) injection 0-4 mg  0-4 mg Intravenous Q12H Regan Lemming, MD       Or   Derrill Memo ON 05/23/2022] LORazepam (ATIVAN) tablet 0-4 mg  0-4 mg Oral Q12H Regan Lemming, MD       multivitamin with minerals tablet 1 tablet  1 tablet Oral Daily Regan Lemming, MD       pantoprazole (PROTONIX) EC tablet 40 mg   40 mg Oral BID Regan Lemming, MD   40 mg at 05/20/22 2301   polyethylene glycol (MIRALAX / GLYCOLAX) packet 17 g  17 g Oral Daily PRN Regan Lemming, MD       senna-docusate (Senokot-S) tablet 1 tablet  1 tablet Oral BID Regan Lemming, MD       thiamine tablet 100 mg  100 mg Oral Daily Regan Lemming, MD       Current Outpatient Medications  Medication Sig Dispense Refill   bictegravir-emtricitabine-tenofovir AF (BIKTARVY) 50-200-25 MG TABS tablet Take 1 tablet by mouth daily. 30 tablet 3   folic acid (FOLVITE) 1 MG tablet Take 1 tablet (1 mg total) by mouth daily. 30 tablet 3   gabapentin (NEURONTIN) 300 MG capsule Take 1 capsule (300 mg total) by mouth 3 (three) times daily. 90 capsule 3   levETIRAcetam (KEPPRA) 500 MG tablet Take 1 tablet (500 mg total) by mouth 2 (two) times daily. (Patient taking differently: Take 500 mg by mouth daily in the afternoon.) 60 tablet 3   Multiple Vitamin (MULTIVITAMIN WITH MINERALS)  TABS tablet Take 1 tablet by mouth daily. 30 tablet 3   thiamine 100 MG tablet Take 1 tablet (100 mg total) by mouth daily. 30 tablet 3   chlordiazePOXIDE (LIBRIUM) 25 MG capsule 50mg  PO TID x 1D, then 25-50mg  PO BID X 1D, then 25-50mg  PO QD X 1D (Patient not taking: Reported on 05/21/2022) 10 capsule 0   ondansetron (ZOFRAN) 4 MG tablet Take 1 tablet (4 mg total) by mouth every 8 (eight) hours as needed for nausea or vomiting. (Patient not taking: Reported on 05/21/2022) 20 tablet 0   pantoprazole (PROTONIX) 40 MG tablet Take 1 tablet (40 mg total) by mouth 2 (two) times daily. (Patient not taking: Reported on 05/21/2022) 60 tablet 3   polyethylene glycol powder (GLYCOLAX/MIRALAX) 17 GM/SCOOP powder Use 1 capful (17 g) by mouth daily as needed for mild constipation. (Patient not taking: Reported on 05/21/2022) 238 g 0   senna-docusate (SENOKOT-S) 8.6-50 MG tablet Take 1 tablet by mouth 2 (two) times daily. (Patient not taking: Reported on 05/21/2022) 30 tablet 3     Musculoskeletal: Strength & Muscle Tone: within normal limits Gait & Station: normal Patient leans: N/A          Psychiatric Specialty Exam:  Presentation  General Appearance: Casual  Eye Contact:Good  Speech:Clear and Coherent  Speech Volume:Normal  Handedness:Right   Mood and Affect  Mood:Euthymic  Affect:Congruent   Thought Process  Thought Processes:Coherent; Linear  Descriptions of Associations:Intact  Orientation:Full (Time, Place and Person)  Thought Content:Logical  History of Schizophrenia/Schizoaffective disorder:No  Duration of Psychotic Symptoms:Less than six months  Hallucinations:No data recorded Ideas of Reference:None  Suicidal Thoughts:No data recorded Homicidal Thoughts:No data recorded  Sensorium  Memory:Immediate Fair; Recent Fair; Remote Fair  Judgment:Fair  Insight:Fair   Executive Functions  Concentration:Good  Attention Span:Good  [place]  Language:Good   Psychomotor Activity  Psychomotor Activity:No data recorded  Assets  Assets:Communication Skills; Desire for Improvement; Physical Health; Resilience   Sleep  Sleep:No data recorded   Physical Exam: Physical Exam Vitals and nursing note reviewed.  Constitutional:      Appearance: Normal appearance. He is normal weight.  HENT:     Head: Normocephalic and atraumatic.     Nose: Nose normal.  Cardiovascular:     Rate and Rhythm: Normal rate.  Pulmonary:     Effort: Pulmonary effort is normal.  Musculoskeletal:        General: Normal range of motion.     Cervical back: Normal range of motion.  Skin:    General: Skin is warm and dry.  Neurological:     Mental Status: He is alert and oriented to person, place, and time.  Psychiatric:        Attention and Perception: Attention and perception normal.        Mood and Affect: Affect normal. Mood is depressed.        Speech: Speech normal.        Behavior: Behavior  normal. Behavior is cooperative.        Thought Content: Thought content normal.        Cognition and Memory: Cognition and memory normal.    Review of Systems  Constitutional: Negative.   HENT: Negative.    Eyes: Negative.   Respiratory: Negative.    Cardiovascular: Negative.   Gastrointestinal: Negative.   Genitourinary: Negative.   Musculoskeletal:  Positive for myalgias.       Chronic generalized pain to arms, legs and chest  Skin: Negative.   Neurological: Negative.   Psychiatric/Behavioral:  Positive for depression and substance abuse.    Blood pressure 123/83, pulse 77, temperature 98.3 F (36.8 C), temperature source Oral, resp. rate 18, SpO2 97 %. There is no height or weight on file to calculate BMI.  Treatment Plan Summary: Patient discussed with psychiatrist, Dr Nelly Rout, Treatment plan reviewed with attending provider, Dr. Stevie Kern.  Patient cleared by psychiatry, follow up with outpatient psychiatry. Consider [facility].  Recommend consider TOC consult for social work needs. Patient requests resources for housing/homeless needs and residential substance use treatment options. Patient also requests assistance with acquiring prescription medications. Reports financially unable to afford medications.   Disposition: No evidence of imminent risk to self or others at present.   Patient does not meet criteria for psychiatric inpatient admission. Supportive therapy provided about ongoing stressors. Discussed crisis plan, support from social network, calling 911, coming to the Emergency Department, and calling Suicide Hotline.  This service was provided via telemedicine using a 2-way, interactive audio and video technology.  Names of all persons participating in this telemedicine service and their role in this encounter. Name: Gary [name] Role: Patient  Name: Dr Stevie Kern Role: Attending MD  Name: Dr Lucianne Muss Role: Psychiatrist    Lenard Lance,  FNP 05/21/2022 11:23 AM

## 2022-05-21 NOTE — ED Notes (Signed)
Sitter at bedside.

## 2022-05-21 NOTE — ED Triage Notes (Signed)
Pt presents to ED with c/o visual hallucinations that began this morning. Pt states he is watching for demons. ETOH

## 2022-05-21 NOTE — Discharge Instructions (Signed)
Follow-up with your primary care doctor.  See attached resource guides.  Come back to ER as needed if you have any episodes of passing out or start to have alcohol withdrawal symptoms such as severe anxiety, shakiness, vomiting, seizure like activity or other new concerns.

## 2022-05-21 NOTE — ED Notes (Signed)
Provided bus pass and discharge instructions. Highlighted appointment for patient but he was not interested, states " I am just going back to drinking" pt flicked the discharge instructions back at this nurse.

## 2022-05-21 NOTE — ED Notes (Signed)
I went in to retrieve the blood for the ethanol which at the time was the only order. Pt is extremely agitated. After I drew the blood the other orders populated. Because of the pt demeanor, I did not re-stick the patient.

## 2022-05-21 NOTE — ED Notes (Signed)
Pt resting with eyes closed; respirations spontaneous, even, unlabored; sitter at bedside  

## 2022-05-22 LAB — HIV-1 RNA QUANT-NO REFLEX-BLD
HIV 1 RNA Quant: <20 copies/mL
LOG10 HIV-1 RNA: UNDETERMINED log10copy/mL

## 2022-05-22 LAB — ETHANOL: Alcohol, Ethyl (B): 320 mg/dL (ref <10)

## 2022-05-22 MED ORDER — LEVETIRACETAM 500 MG PO TABS
500.0000 mg | ORAL_TABLET | Freq: Once | ORAL | Status: AC
  Administered 2022-05-22: 500 mg via ORAL
  Filled 2022-05-22: qty 1

## 2022-05-22 NOTE — ED Provider Notes (Addendum)
[name]-EMERGENCY DEPT Provider Note   CSN: [id number] Arrival date & time: 05/21/22  2259     History  Chief Complaint  Patient presents with   Hallucinations    Gary [name] is a 36 y.o. male.  HPI  Patient with past medical history of alcohol use disorder, h/o seizures on keppra, bipolar 2, pancreatitis presents today due to alcohol intoxication.  Patient endorses heavy alcohol use today, he is clearly intoxicated on exam.  Slurring his words, denies pain anywhere.  He has vomited once today.  Patient endorses visual hallucinations starting after alcohol consumption "watching for demons".  Home Medications Prior to Admission medications   Medication Sig Start Date End Date Taking? Authorizing Provider  bictegravir-emtricitabine-tenofovir AF (BIKTARVY) 50-200-25 MG TABS tablet Take 1 tablet by mouth daily. 05/01/22  Yes Kathlen Mody, MD  folic acid (FOLVITE) 1 MG tablet Take 1 tablet (1 mg total) by mouth daily. 05/01/22  Yes Kathlen Mody, MD  gabapentin (NEURONTIN) 300 MG capsule Take 1 capsule (300 mg total) by mouth 3 (three) times daily. 05/01/22  Yes Kathlen Mody, MD  levETIRAcetam (KEPPRA) 500 MG tablet Take 1 tablet (500 mg total) by mouth 2 (two) times daily. Patient taking differently: Take 500 mg by mouth daily in the afternoon. 05/01/22  Yes Kathlen Mody, MD  Multiple Vitamin (MULTIVITAMIN WITH MINERALS) TABS tablet Take 1 tablet by mouth daily. 05/02/22  Yes Kathlen Mody, MD  thiamine 100 MG tablet Take 1 tablet (100 mg total) by mouth daily. 05/01/22  Yes Kathlen Mody, MD  chlordiazePOXIDE (LIBRIUM) 25 MG capsule 50mg  PO TID x 1D, then 25-50mg  PO BID X 1D, then 25-50mg  PO QD X 1D Patient not taking: Reported on 05/21/2022 05/16/22   Mesner, 07/17/22, MD  ondansetron (ZOFRAN) 4 MG tablet Take 1 tablet (4 mg total) by mouth every 8 (eight) hours as needed for nausea or vomiting. Patient not taking: Reported on 05/21/2022 05/01/22   05/03/22, MD   pantoprazole (PROTONIX) 40 MG tablet Take 1 tablet (40 mg total) by mouth 2 (two) times daily. Patient not taking: Reported on 05/21/2022 05/01/22   05/03/22, MD  polyethylene glycol powder (GLYCOLAX/MIRALAX) 17 GM/SCOOP powder Use 1 capful (17 g) by mouth daily as needed for mild constipation. Patient not taking: Reported on 05/21/2022 05/01/22   05/03/22, MD  senna-docusate (SENOKOT-S) 8.6-50 MG tablet Take 1 tablet by mouth 2 (two) times daily. Patient not taking: Reported on 05/21/2022 05/01/22   05/03/22, MD  famotidine (PEPCID) 20 MG tablet Take 1 tablet (20 mg total) by mouth 2 (two) times daily. Patient not taking: Reported on 06/01/2019 05/14/19 06/02/19  06/04/19, MD      Allergies    Tegretol [carbamazepine] and Caffeine    Review of Systems   Review of Systems  Physical Exam Updated Vital Signs BP 102/72   Pulse 83   Temp 99.2 F (37.3 C) (Oral)   Resp 16   Ht 6' (1.829 m)   Wt 65.8 kg   SpO2 98%   BMI 19.67 kg/m  Physical Exam Vitals and nursing note reviewed. Exam conducted with a chaperone present.  Constitutional:      General: He is not in acute distress.    Appearance: Normal appearance.     Comments: Patient clearly intoxicated slurring words  HENT:     Head: Normocephalic and atraumatic.  Eyes:     General: No scleral icterus.       Right eye: No  discharge.        Left eye: No discharge.     Extraocular Movements: Extraocular movements intact.     Pupils: Pupils are equal, round, and reactive to light.  Cardiovascular:     Rate and Rhythm: Normal rate and regular rhythm.     Pulses: Normal pulses.     Heart sounds: Normal heart sounds. No murmur heard.    No friction rub. No gallop.     Comments: Rhythm is regular on exam Pulmonary:     Effort: Pulmonary effort is normal. No respiratory distress.     Breath sounds: Normal breath sounds.  Abdominal:     General: Abdomen is flat. Bowel sounds are normal. There is no distension.      Palpations: Abdomen is soft.     Tenderness: There is no abdominal tenderness.  Skin:    General: Skin is warm and dry.     Coloration: Skin is not jaundiced.  Neurological:     Mental Status: He is alert. Mental status is at baseline.     Coordination: Coordination normal.  Psychiatric:        Attention and Perception: He perceives visual hallucinations.        Speech: Speech is slurred.        Behavior: Behavior is cooperative.     ED Results / Procedures / Treatments   Labs (all labs ordered are listed, but only abnormal results are displayed) Labs Reviewed  ETHANOL - Abnormal; Notable for the following components:      Result Value   Alcohol, Ethyl (B) 320 (*)    All other components within normal limits    EKG None  Radiology DG Chest Portable 1 View  Result Date: 05/20/2022 CLINICAL DATA:  Altered mental status EXAM: PORTABLE CHEST 1 VIEW COMPARISON:  12/31/2021, 04/25/2022 FINDINGS: Chronic elevation left diaphragm. No acute airspace disease or effusion. Normal cardiac size. No pneumothorax IMPRESSION: No active disease.  Chronic elevation of left diaphragm Electronically Signed   By: Jasmine Pang M.D.   On: 05/20/2022 21:13   CT HEAD WO CONTRAST ( )  Result Date: 05/20/2022 CLINICAL DATA:  Altered mental status EXAM: CT HEAD WITHOUT CONTRAST TECHNIQUE: Contiguous axial images were obtained from the base of the skull through the vertex without intravenous contrast. RADIATION DOSE REDUCTION: This exam was performed according to the departmental dose-optimization program which includes automated exposure control, adjustment of the mA and/or kV according to patient size and/or use of iterative reconstruction technique. COMPARISON:  04/16/2022 FINDINGS: Brain: No evidence of acute infarction, hemorrhage, hydrocephalus, extra-axial collection or mass lesion/mass effect. Mild subcortical white matter and periventricular small vessel ischemic changes. Vascular: No hyperdense  vessel or unexpected calcification. Skull: Normal. Negative for fracture or focal lesion. Sinuses/Orbits: The visualized paranasal sinuses are essentially clear. The mastoid air cells are unopacified. Other: None. IMPRESSION: No evidence of acute intracranial abnormality. Mild small vessel ischemic changes. Electronically Signed   By: Charline Bills M.D.   On: 05/20/2022 21:11    Procedures Procedures    Medications Ordered in ED Medications  levETIRAcetam (KEPPRA) tablet 500 mg (500 mg Oral Given 05/22/22 0419)    ED Course/ Medical Decision Making/ A&P Clinical Course as of 05/22/22 0518  Fri May 22, 2022  0414 Reevaluation patient is resting comfortably.  He is now able to make a complete sentence but is still clearly inebriated.  Will give home dose of Keppra and continue to observe [HS]    Clinical Course User Index [  HS] Theron Arista, PA-C                           Medical Decision Making Amount and/or Complexity of Data Reviewed Labs: ordered.  Risk Prescription drug management.   Patient presents due to hallucinations from alcohol intoxication.  Differential includes acute withdrawal, seizures due to lowered seizure threshold, alcohol induced pancreatitis.  On exam patient is not tachycardic, he is clearly intoxicated but does not appear to be responding to internal stimuli.  He does endorse visual hallucinations but is not suicidal or homicidal during my initial exam.  Ethanol was obtained in triage and is elevated at 320.  He does not appear to be actively withdrawing, not tachycardic, no DTs and not having seizures.  He did state he did not take his Keppra dose today.  We will monitor patient until he metabolizes the alcohol and is clinically sober.    I consider getting laboratory work-up but patient was actually seen 05/20/2022 and had labs done at that time.  I reviewed those labs.  Patient was mildly anemic at 11.6, slightly leukopenic at 2.9 but roughly baseline.   CMP was without any gross electrolyte derangement, AKI or transaminitis.  Patient was monitored, he is doing much better now.  Feeling improved, normal mood.  Patient provided resources for shelters and discharged in stable condition.  Discussed HPI, physical exam and plan of care for this patient with attending Susy Frizzle. The attending physician evaluated this patient as part of a shared visit and agrees with plan of care.    Final Clinical Impression(s) / ED Diagnoses Final diagnoses:  Alcohol withdrawal syndrome without complication [facility])    Rx / DC Orders ED Discharge Orders     None         Theron Arista, PA-C 05/22/22 0517    Theron Arista, PA-C 05/22/22 0518    Pollyann Savoy, MD 05/22/22 [phone]

## 2022-05-22 NOTE — ED Notes (Signed)
Pt upset about being discharged. This RN attempted to give the pt his d/c papers, he states that "he doesn't need that shit." Pt being taken outside by wheelchair

## 2022-05-27 ENCOUNTER — Encounter (HOSPITAL_COMMUNITY): Payer: Self-pay | Admitting: Emergency Medicine

## 2022-05-27 ENCOUNTER — Inpatient Hospital Stay (HOSPITAL_COMMUNITY)
Admission: EM | Admit: 2022-05-27 | Discharge: 2022-06-01 | DRG: 683 | Disposition: A | Discharge status: Home / Self Care | Payer: Self-pay | Attending: Internal Medicine | Admitting: Internal Medicine

## 2022-05-27 ENCOUNTER — Other Ambulatory Visit: Payer: Self-pay

## 2022-05-27 DIAGNOSIS — E860 Dehydration: Secondary | ICD-10-CM

## 2022-05-27 DIAGNOSIS — F419 Anxiety disorder, unspecified: Secondary | ICD-10-CM | POA: Diagnosis present

## 2022-05-27 DIAGNOSIS — M25561 Pain in right knee: Secondary | ICD-10-CM | POA: Diagnosis present

## 2022-05-27 DIAGNOSIS — B20 Human immunodeficiency virus [HIV] disease: Secondary | ICD-10-CM | POA: Diagnosis present

## 2022-05-27 DIAGNOSIS — R569 Unspecified convulsions: Secondary | ICD-10-CM | POA: Diagnosis present

## 2022-05-27 DIAGNOSIS — F209 Schizophrenia, unspecified: Secondary | ICD-10-CM | POA: Diagnosis present

## 2022-05-27 DIAGNOSIS — F10229 Alcohol dependence with intoxication, unspecified: Secondary | ICD-10-CM | POA: Diagnosis present

## 2022-05-27 DIAGNOSIS — E876 Hypokalemia: Secondary | ICD-10-CM | POA: Diagnosis not present

## 2022-05-27 DIAGNOSIS — M25551 Pain in right hip: Secondary | ICD-10-CM | POA: Diagnosis present

## 2022-05-27 DIAGNOSIS — F1910 Other psychoactive substance abuse, uncomplicated: Secondary | ICD-10-CM | POA: Diagnosis present

## 2022-05-27 DIAGNOSIS — K862 Cyst of pancreas: Secondary | ICD-10-CM | POA: Diagnosis present

## 2022-05-27 DIAGNOSIS — Z888 Allergy status to other drugs, medicaments and biological substances status: Secondary | ICD-10-CM

## 2022-05-27 DIAGNOSIS — Z87898 Personal history of other specified conditions: Secondary | ICD-10-CM

## 2022-05-27 DIAGNOSIS — Z79899 Other long term (current) drug therapy: Secondary | ICD-10-CM

## 2022-05-27 DIAGNOSIS — N179 Acute kidney failure, unspecified: Principal | ICD-10-CM

## 2022-05-27 DIAGNOSIS — F10939 Alcohol use, unspecified with withdrawal, unspecified: Secondary | ICD-10-CM | POA: Diagnosis present

## 2022-05-27 DIAGNOSIS — Y906 Blood alcohol level of 120-199 mg/100 ml: Secondary | ICD-10-CM | POA: Diagnosis present

## 2022-05-27 DIAGNOSIS — M25552 Pain in left hip: Secondary | ICD-10-CM | POA: Diagnosis present

## 2022-05-27 DIAGNOSIS — E8729 Other acidosis: Secondary | ICD-10-CM | POA: Diagnosis present

## 2022-05-27 DIAGNOSIS — G8929 Other chronic pain: Secondary | ICD-10-CM | POA: Diagnosis present

## 2022-05-27 DIAGNOSIS — E872 Acidosis, unspecified: Secondary | ICD-10-CM

## 2022-05-27 DIAGNOSIS — Z20822 Contact with and (suspected) exposure to covid-19: Secondary | ICD-10-CM | POA: Diagnosis present

## 2022-05-27 DIAGNOSIS — Z21 Asymptomatic human immunodeficiency virus [HIV] infection status: Secondary | ICD-10-CM | POA: Diagnosis present

## 2022-05-27 DIAGNOSIS — F10239 Alcohol dependence with withdrawal, unspecified: Secondary | ICD-10-CM | POA: Diagnosis not present

## 2022-05-27 DIAGNOSIS — F3181 Bipolar II disorder: Secondary | ICD-10-CM | POA: Diagnosis present

## 2022-05-27 DIAGNOSIS — Z811 Family history of alcohol abuse and dependence: Secondary | ICD-10-CM

## 2022-05-27 DIAGNOSIS — M25562 Pain in left knee: Secondary | ICD-10-CM | POA: Diagnosis present

## 2022-05-27 DIAGNOSIS — F4310 Post-traumatic stress disorder, unspecified: Secondary | ICD-10-CM | POA: Diagnosis present

## 2022-05-27 DIAGNOSIS — Z5900 Homelessness unspecified: Secondary | ICD-10-CM

## 2022-05-27 DIAGNOSIS — F1010 Alcohol abuse, uncomplicated: Secondary | ICD-10-CM

## 2022-05-27 DIAGNOSIS — F1020 Alcohol dependence, uncomplicated: Secondary | ICD-10-CM | POA: Diagnosis present

## 2022-05-27 DIAGNOSIS — R251 Tremor, unspecified: Secondary | ICD-10-CM | POA: Diagnosis present

## 2022-05-27 DIAGNOSIS — F17210 Nicotine dependence, cigarettes, uncomplicated: Secondary | ICD-10-CM | POA: Diagnosis present

## 2022-05-27 DIAGNOSIS — D696 Thrombocytopenia, unspecified: Secondary | ICD-10-CM | POA: Diagnosis present

## 2022-05-27 LAB — COMPREHENSIVE METABOLIC PANEL
ALT: 51 U/L — ABNORMAL HIGH (ref 0–44)
AST: 115 U/L — ABNORMAL HIGH (ref 15–41)
Albumin: 4 g/dL (ref 3.5–5.0)
Alkaline Phosphatase: 56 U/L (ref 38–126)
Anion gap: 24 — ABNORMAL HIGH (ref 5–15)
BUN: 15 mg/dL (ref 6–20)
CO2: 14 mmol/L — ABNORMAL LOW (ref 22–32)
Calcium: 8.7 mg/dL — ABNORMAL LOW (ref 8.9–10.3)
Chloride: 105 mmol/L (ref 98–111)
Creatinine, Ser: 1.29 mg/dL — ABNORMAL HIGH (ref 0.61–1.24)
GFR, Estimated: >60 mL/min (ref >60)
Glucose, Bld: 119 mg/dL — ABNORMAL HIGH (ref 70–99)
Potassium: 4.4 mmol/L (ref 3.5–5.1)
Sodium: 143 mmol/L (ref 135–145)
Total Bilirubin: 1.1 mg/dL (ref 0.3–1.2)
Total Protein: 7 g/dL (ref 6.5–8.1)

## 2022-05-27 LAB — CBC WITH DIFFERENTIAL/PLATELET
Abs Immature Granulocytes: 0.02 10*3/uL (ref 0.00–0.07)
Basophils Absolute: 0 10*3/uL (ref 0.0–0.1)
Basophils Relative: 0 %
Eosinophils Absolute: 0 10*3/uL (ref 0.0–0.5)
Eosinophils Relative: 0 %
HCT: 43.4 % (ref 39.0–52.0)
Hemoglobin: 14.3 g/dL (ref 13.0–17.0)
Immature Granulocytes: 0 %
Lymphocytes Relative: 8 %
Lymphs Abs: 0.5 10*3/uL — ABNORMAL LOW (ref 0.7–4.0)
MCH: 33.6 pg (ref 26.0–34.0)
MCHC: 32.9 g/dL (ref 30.0–36.0)
MCV: 101.9 fL — ABNORMAL HIGH (ref 80.0–100.0)
Monocytes Absolute: 0.3 10*3/uL (ref 0.1–1.0)
Monocytes Relative: 5 %
Neutro Abs: 5.1 10*3/uL (ref 1.7–7.7)
Neutrophils Relative %: 87 %
Platelets: 107 10*3/uL — ABNORMAL LOW (ref 150–400)
RBC: 4.26 MIL/uL (ref 4.22–5.81)
RDW: 14 % (ref 11.5–15.5)
WBC: 5.9 10*3/uL (ref 4.0–10.5)
nRBC: 0 % (ref 0.0–0.2)

## 2022-05-27 LAB — SALICYLATE LEVEL: Salicylate Lvl: <7 mg/dL — ABNORMAL LOW (ref 7.0–30.0)

## 2022-05-27 LAB — ACETAMINOPHEN LEVEL: Acetaminophen (Tylenol), Serum: <10 ug/mL — ABNORMAL LOW (ref 10–30)

## 2022-05-27 LAB — I-STAT CHEM 8, ED
BUN: 15 mg/dL (ref 6–20)
Calcium, Ion: 1.06 mmol/L — ABNORMAL LOW (ref 1.15–1.40)
Chloride: 104 mmol/L (ref 98–111)
Creatinine, Ser: 1.2 mg/dL (ref 0.61–1.24)
Glucose, Bld: 122 mg/dL — ABNORMAL HIGH (ref 70–99)
HCT: 44 % (ref 39.0–52.0)
Hemoglobin: 15 g/dL (ref 13.0–17.0)
Potassium: 4.2 mmol/L (ref 3.5–5.1)
Sodium: 140 mmol/L (ref 135–145)
TCO2: 14 mmol/L — ABNORMAL LOW (ref 22–32)

## 2022-05-27 LAB — ETHANOL: Alcohol, Ethyl (B): 177 mg/dL — ABNORMAL HIGH (ref <10)

## 2022-05-27 LAB — LIPASE, BLOOD: Lipase: 103 U/L — ABNORMAL HIGH (ref 11–51)

## 2022-05-27 MED ORDER — LORAZEPAM 2 MG/ML IJ SOLN
2.0000 mg | Freq: Once | INTRAMUSCULAR | Status: AC
  Administered 2022-05-27: 2 mg via INTRAVENOUS

## 2022-05-27 MED ORDER — LEVETIRACETAM 500 MG PO TABS
500.0000 mg | ORAL_TABLET | Freq: Two times a day (BID) | ORAL | Status: DC
  Administered 2022-05-28 – 2022-06-01 (×9): 500 mg via ORAL

## 2022-05-27 MED ORDER — THIAMINE HCL 100 MG PO TABS: 100.0000 mg | ORAL_TABLET | Freq: Every day | ORAL | Status: DC

## 2022-05-27 MED ORDER — DEXTROSE IN LACTATED RINGERS 5 % IV SOLN: INTRAVENOUS | Status: DC

## 2022-05-27 MED ORDER — THIAMINE HCL 100 MG/ML IJ SOLN
100.0000 mg | Freq: Once | INTRAMUSCULAR | Status: AC
Start: 2022-05-27 — End: 2022-05-27
  Administered 2022-05-27: 100 mg via INTRAVENOUS

## 2022-05-27 MED ORDER — PANTOPRAZOLE SODIUM 40 MG PO TBEC: 40.0000 mg | DELAYED_RELEASE_TABLET | Freq: Two times a day (BID) | ORAL | Status: DC

## 2022-05-27 MED ORDER — CHLORDIAZEPOXIDE HCL 25 MG PO CAPS
50.0000 mg | ORAL_CAPSULE | Freq: Once | ORAL | Status: AC
  Administered 2022-05-27: 50 mg via ORAL

## 2022-05-27 MED ORDER — LEVETIRACETAM IN NACL 1000 MG/100ML IV SOLN
1000.0000 mg | Freq: Once | INTRAVENOUS | Status: AC
  Administered 2022-05-27: 1000 mg via INTRAVENOUS

## 2022-05-27 MED ORDER — BICTEGRAVIR-EMTRICITAB-TENOFOV 50-200-25 MG PO TABS
1.0000 | ORAL_TABLET | Freq: Every day | ORAL | Status: DC
  Administered 2022-05-28 – 2022-06-01 (×5): 1 via ORAL
  Filled 2022-05-27 (×5): qty 1

## 2022-05-27 MED ORDER — SODIUM CHLORIDE 0.9 % IV BOLUS
2000.0000 mL | Freq: Once | INTRAVENOUS | Status: AC
  Administered 2022-05-27: 2000 mL via INTRAVENOUS

## 2022-05-27 NOTE — ED Triage Notes (Signed)
Patient arrived via [facility] with complaints of generalized body ache, nausea and vomiting, shacking. Patient states he is having alcohol withdrawal. States he drinks about half of gallon of vodka a day; reports he had small amount of drink earlier this morning. Hx of seizure but out of meds for about 2 weeks.   Per EMS: HR-112 BP-132/66 Spo2-99% on RA CBG-100

## 2022-05-27 NOTE — ED Provider Notes (Signed)
[facility] EMERGENCY DEPARTMENT Provider Note   CSN: [id number] Arrival date & time: 05/27/22  2137     History  Chief Complaint  Patient presents with   Alcohol Problem    [name] is a 36 y.o. male.  HPI Patient presents for concern of alcohol withdrawal.  Medical history includes alcohol abuse, bipolar disorder, HIV, schizophrenia, seizures, depression, GI bleed, polysubstance abuse.  He was admitted 1 month ago for pancreatitis and alcohol withdrawal.  He has been seen in the ED multiple times since then.  Most recently he was seen 5 days ago.  Patient reports that he lost his home medications and has not taken them in the past several weeks.  This includes his HIV medicine as well as a seizure prophylaxis.  He reports a typical daily alcohol intake of 1/5-1/2 gallon of vodka.  His last drink was earlier this morning around 6 AM.  He states that he has not eaten any food in the past 3 days.  Throughout the day, he had nausea and vomiting.  Because of this, he has not been able to take in any fluids.  He has also been out in the sun for most of the day.  He believes that he had seizures but is unsure.  He reports acute on chronic pain in bilateral knees, bilateral hips, and lower back.  He has generalized abdominal pain.  Patient reports that he does wish to stop drinking.    Home Medications Prior to Admission medications   Medication Sig Start Date End Date Taking? Authorizing Provider  bictegravir-emtricitabine-tenofovir AF (BIKTARVY) 50-200-25 MG TABS tablet Take 1 tablet by mouth daily. 05/01/22   Kathlen Mody, MD  chlordiazePOXIDE (LIBRIUM) 25 MG capsule 50mg  PO TID x 1D, then 25-50mg  PO BID X 1D, then 25-50mg  PO QD X 1D Patient not taking: Reported on 05/21/2022 05/16/22   Mesner, 07/17/22, MD  folic acid (FOLVITE) 1 MG tablet Take 1 tablet (1 mg total) by mouth daily. 05/01/22   05/03/22, MD  gabapentin (NEURONTIN) 300 MG capsule Take 1 capsule (300 mg  total) by mouth 3 (three) times daily. 05/01/22   05/03/22, MD  levETIRAcetam (KEPPRA) 500 MG tablet Take 1 tablet (500 mg total) by mouth 2 (two) times daily. Patient taking differently: Take 500 mg by mouth daily in the afternoon. 05/01/22   05/03/22, MD  Multiple Vitamin (MULTIVITAMIN WITH MINERALS) TABS tablet Take 1 tablet by mouth daily. 05/02/22   05/04/22, MD  ondansetron (ZOFRAN) 4 MG tablet Take 1 tablet (4 mg total) by mouth every 8 (eight) hours as needed for nausea or vomiting. Patient not taking: Reported on 05/21/2022 05/01/22   05/03/22, MD  pantoprazole (PROTONIX) 40 MG tablet Take 1 tablet (40 mg total) by mouth 2 (two) times daily. Patient not taking: Reported on 05/21/2022 05/01/22   05/03/22, MD  polyethylene glycol powder (GLYCOLAX/MIRALAX) 17 GM/SCOOP powder Use 1 capful (17 g) by mouth daily as needed for mild constipation. Patient not taking: Reported on 05/21/2022 05/01/22   05/03/22, MD  senna-docusate (SENOKOT-S) 8.6-50 MG tablet Take 1 tablet by mouth 2 (two) times daily. Patient not taking: Reported on 05/21/2022 05/01/22   05/03/22, MD  thiamine 100 MG tablet Take 1 tablet (100 mg total) by mouth daily. 05/01/22   05/03/22, MD  famotidine (PEPCID) 20 MG tablet Take 1 tablet (20 mg total) by mouth 2 (two) times daily. Patient not taking: Reported on 06/01/2019  05/14/19 06/02/19  Benjiman Core, MD      Allergies    Tegretol [carbamazepine] and Caffeine    Review of Systems   Review of Systems  Constitutional:  Positive for activity change, appetite change and fatigue.  Gastrointestinal:  Positive for abdominal pain, nausea and vomiting.  Musculoskeletal:  Positive for arthralgias, back pain and myalgias.  Psychiatric/Behavioral:  The patient is nervous/anxious.   All other systems reviewed and are negative.   Physical Exam Updated Vital Signs BP 116/89   Pulse (!) 110   Temp 98.9 F (37.2 C) (Oral)   Resp 17   Ht 6' (1.829  m)   Wt 68 kg   SpO2 99%   BMI 20.34 kg/m  Physical Exam Vitals and nursing note reviewed.  Constitutional:      General: He is not in acute distress.    Appearance: He is well-developed and normal weight. He is not toxic-appearing or diaphoretic.  HENT:     Head: Normocephalic and atraumatic.     Right Ear: External ear normal.     Left Ear: External ear normal.     Nose: Nose normal.     Mouth/Throat:     Mouth: Mucous membranes are moist.     Pharynx: Oropharynx is clear.  Eyes:     General: No scleral icterus.    Extraocular Movements: Extraocular movements intact.     Conjunctiva/sclera: Conjunctivae normal.  Cardiovascular:     Rate and Rhythm: Normal rate and regular rhythm.     Heart sounds: No murmur heard. Pulmonary:     Effort: Pulmonary effort is normal. No respiratory distress.     Breath sounds: Normal breath sounds. No wheezing or rales.  Abdominal:     Palpations: Abdomen is soft.     Tenderness: There is abdominal tenderness.  Musculoskeletal:        General: No swelling or deformity. Normal range of motion.     Cervical back: Normal range of motion and neck supple.     Right lower leg: No edema.     Left lower leg: No edema.  Skin:    General: Skin is warm.     Capillary Refill: Capillary refill takes less than 2 seconds.     Coloration: Skin is not jaundiced.  Neurological:     General: No focal deficit present.     Mental Status: He is alert and oriented to person, place, and time.     Cranial Nerves: No cranial nerve deficit.     Sensory: No sensory deficit.     Motor: No weakness.     Coordination: Coordination normal.  Psychiatric:        Mood and Affect: Mood is anxious.        Speech: Speech normal. Speech is not rapid and pressured or slurred.        Behavior: Behavior normal. Behavior is cooperative.     ED Results / Procedures / Treatments   Labs (all labs ordered are listed, but only abnormal results are displayed) Labs Reviewed   CBC WITH DIFFERENTIAL/PLATELET - Abnormal; Notable for the following components:      Result Value   MCV 101.9 (*)    Platelets 107 (*)    Lymphs Abs 0.5 (*)    All other components within normal limits  COMPREHENSIVE METABOLIC PANEL - Abnormal; Notable for the following components:   CO2 14 (*)    Glucose, Bld 119 (*)    Creatinine, Ser 1.29 (*)  Calcium 8.7 (*)    AST 115 (*)    ALT 51 (*)    Anion gap 24 (*)    All other components within normal limits  ETHANOL - Abnormal; Notable for the following components:   Alcohol, Ethyl (B) 177 (*)    All other components within normal limits  LIPASE, BLOOD - Abnormal; Notable for the following components:   Lipase 103 (*)    All other components within normal limits  SALICYLATE LEVEL - Abnormal; Notable for the following components:   Salicylate Lvl <7.0 (*)    All other components within normal limits  ACETAMINOPHEN LEVEL - Abnormal; Notable for the following components:   Acetaminophen (Tylenol), Serum <10 (*)    All other components within normal limits  I-STAT CHEM 8, ED - Abnormal; Notable for the following components:   Glucose, Bld 122 (*)    Calcium, Ion 1.06 (*)    TCO2 14 (*)    All other components within normal limits  RAPID URINE DRUG SCREEN, HOSP PERFORMED  LEVETIRACETAM LEVEL  CBG MONITORING, ED    EKG EKG Interpretation  Date/Time:  Wednesday May 27 2022 22:01:36 EDT Ventricular Rate:  105 PR Interval:  135 QRS Duration: 91 QT Interval:  349 QTC Calculation: 462 R Axis:   74 Text Interpretation: Sinus tachycardia Probable left ventricular hypertrophy Confirmed by Gloris Manchester [phone]) on 05/27/2022 10:27:26 PM  Radiology No results found.  Procedures Procedures    Medications Ordered in ED Medications  bictegravir-emtricitabine-tenofovir AF (BIKTARVY) 50-200-25 MG per tablet 1 tablet (has no administration in time range)  levETIRAcetam (KEPPRA) tablet 500 mg (has no administration in time range)   pantoprazole (PROTONIX) EC tablet 40 mg (has no administration in time range)  thiamine tablet 100 mg (has no administration in time range)  thiamine (B-1) injection 100 mg (has no administration in time range)  levETIRAcetam (KEPPRA) IVPB 1000 mg/100 mL premix (has no administration in time range)  dextrose 5 % in lactated ringers infusion (has no administration in time range)  sodium chloride 0.9 % bolus 2,000 mL (0 mLs Intravenous Stopped 05/27/22 2344)  LORazepam (ATIVAN) injection 2 mg (2 mg Intravenous Given 05/27/22 2215)  chlordiazePOXIDE (LIBRIUM) capsule 50 mg (50 mg Oral Given 05/27/22 2215)    ED Course/ Medical Decision Making/ A&P                           Medical Decision Making Risk OTC drugs. Prescription drug management.   This patient presents to the ED for concern of p.o. intolerance, generalized aches, and fatigue, this involves an extensive number of treatment options, and is a complaint that carries with it a high risk of complications and morbidity.  The differential diagnosis includes alcohol withdrawal, substance use, dehydration, metabolic derangements, pancreatitis  Co morbidities that complicate the patient evaluation  alcohol abuse, bipolar disorder, HIV, schizophrenia, seizures, depression, GI bleed, polysubstance abuse   Additional history obtained:  Additional history obtained from N/A External records from outside source obtained and reviewed including EMR   Lab Tests:  I Ordered, and personally interpreted labs.  The pertinent results include: CMP shows AKI, elevation in transaminases consistent with chronic alcohol abuse, and an anion gap metabolic acidosis (likely combination of alcohol/starvation ketosis and NAD+/NADH mismatch), ethanol level elevated at 177 (this is lower than levels during previous presentations and, although elevated, could be consistent with withdrawal), lipase is only mildly elevated at 103 (down trended since his prior  pancreatitis episode)  Cardiac Monitoring: / EKG:  The patient was maintained on a cardiac monitor.  I personally viewed and interpreted the cardiac monitored which showed an underlying rhythm of: Sinus rhythm  Problem List / ED Course / Critical interventions / Medication management  Patient is a 36 year old male with history of alcohol abuse and frequent hospitalizations and ED visits, presenting for multiple complaints.  Throughout the day today, he has had generalized aches, p.o. intolerance, nausea, vomiting, and severe fatigue.  He states that he has been taking in large amounts of alcohol daily.  His last drink was early this morning.  He does feel that he had multiple seizures during the day.  This could be secondary to withdrawal, as well as medication nonadherence.  He has not taken his Keppra in weeks.  He has also been off of his HIV medications.  With his p.o. intolerance, vomiting, and time in the sun, patient is likely significantly dehydrated.  He is tachycardic on arrival.  2 L of IVF were ordered.  Patient was given Ativan and Librium for treatment/prevention of alcohol withdrawals.  A loading dose of Keppra was ordered.  Patient's other home medication, including his Susanne Borders was ordered.  Patient's lab work is notable for an anion gap metabolic acidosis, and AKI.  On reassessment, patient reports only mild improvement in symptoms.  Given his metabolic derangements patient to be admitted to hospitalist for further management. I ordered medication including IV fluids for dehydration; Ativan and Librium for treatment/prevention of alcohol withdrawal; Keppra for seizure prophylaxis; home medications for nonadherence Reevaluation of the patient after these medicines showed that the patient improved I have reviewed the patients home medicines and have made adjustments as needed   Social Determinants of Health:  Homelessness and alcohol abuse, frequent visits to the ED.  CRITICAL  CARE Performed by: Gloris Manchester   Total critical care time: 32 minutes  Critical care time was exclusive of separately billable procedures and treating other patients.  Critical care was necessary to treat or prevent imminent or life-threatening deterioration.  Critical care was time spent personally by me on the following activities: development of treatment plan with patient and/or surrogate as well as nursing, discussions with consultants, evaluation of patient's response to treatment, examination of patient, obtaining history from patient or surrogate, ordering and performing treatments and interventions, ordering and review of laboratory studies, ordering and review of radiographic studies, pulse oximetry and re-evaluation of patient's condition.         Final Clinical Impression(s) / ED Diagnoses Final diagnoses:  Dehydration  Metabolic acidosis  AKI (acute kidney injury) (HCC)  Alcohol abuse    Rx / DC Orders ED Discharge Orders     None         Gloris Manchester, MD 05/27/22 2348

## 2022-05-28 ENCOUNTER — Other Ambulatory Visit (HOSPITAL_COMMUNITY): Payer: Self-pay

## 2022-05-28 ENCOUNTER — Encounter (HOSPITAL_COMMUNITY): Payer: Self-pay | Admitting: Internal Medicine

## 2022-05-28 DIAGNOSIS — E872 Acidosis, unspecified: Secondary | ICD-10-CM

## 2022-05-28 DIAGNOSIS — N179 Acute kidney failure, unspecified: Secondary | ICD-10-CM | POA: Diagnosis present

## 2022-05-28 DIAGNOSIS — B20 Human immunodeficiency virus [HIV] disease: Secondary | ICD-10-CM

## 2022-05-28 DIAGNOSIS — F1910 Other psychoactive substance abuse, uncomplicated: Secondary | ICD-10-CM

## 2022-05-28 DIAGNOSIS — F17210 Nicotine dependence, cigarettes, uncomplicated: Secondary | ICD-10-CM

## 2022-05-28 DIAGNOSIS — Z87898 Personal history of other specified conditions: Secondary | ICD-10-CM

## 2022-05-28 DIAGNOSIS — F1020 Alcohol dependence, uncomplicated: Secondary | ICD-10-CM

## 2022-05-28 DIAGNOSIS — Z5900 Homelessness unspecified: Secondary | ICD-10-CM

## 2022-05-28 LAB — RAPID URINE DRUG SCREEN, HOSP PERFORMED
Amphetamines: NOT DETECTED
Barbiturates: NOT DETECTED
Benzodiazepines: NOT DETECTED
Cocaine: NOT DETECTED
Opiates: NOT DETECTED
Tetrahydrocannabinol: NOT DETECTED

## 2022-05-28 LAB — CBC WITH DIFFERENTIAL/PLATELET
Abs Immature Granulocytes: 0.01 10*3/uL (ref 0.00–0.07)
Basophils Absolute: 0 10*3/uL (ref 0.0–0.1)
Basophils Relative: 0 %
Eosinophils Absolute: 0 10*3/uL (ref 0.0–0.5)
Eosinophils Relative: 0 %
HCT: 37.6 % — ABNORMAL LOW (ref 39.0–52.0)
Hemoglobin: 13.1 g/dL (ref 13.0–17.0)
Immature Granulocytes: 0 %
Lymphocytes Relative: 17 %
Lymphs Abs: 0.6 10*3/uL — ABNORMAL LOW (ref 0.7–4.0)
MCH: 34 pg (ref 26.0–34.0)
MCHC: 34.8 g/dL (ref 30.0–36.0)
MCV: 97.7 fL (ref 80.0–100.0)
Monocytes Absolute: 0.4 10*3/uL (ref 0.1–1.0)
Monocytes Relative: 10 %
Neutro Abs: 2.5 10*3/uL (ref 1.7–7.7)
Neutrophils Relative %: 73 %
Platelets: 86 10*3/uL — ABNORMAL LOW (ref 150–400)
RBC: 3.85 MIL/uL — ABNORMAL LOW (ref 4.22–5.81)
RDW: 13.9 % (ref 11.5–15.5)
WBC: 3.5 10*3/uL — ABNORMAL LOW (ref 4.0–10.5)
nRBC: 0 % (ref 0.0–0.2)

## 2022-05-28 LAB — COMPREHENSIVE METABOLIC PANEL
ALT: 40 U/L (ref 0–44)
AST: 68 U/L — ABNORMAL HIGH (ref 15–41)
Albumin: 3.3 g/dL — ABNORMAL LOW (ref 3.5–5.0)
Alkaline Phosphatase: 47 U/L (ref 38–126)
Anion gap: 11 (ref 5–15)
BUN: 9 mg/dL (ref 6–20)
CO2: 20 mmol/L — ABNORMAL LOW (ref 22–32)
Calcium: 7.7 mg/dL — ABNORMAL LOW (ref 8.9–10.3)
Chloride: 107 mmol/L (ref 98–111)
Creatinine, Ser: 0.74 mg/dL (ref 0.61–1.24)
GFR, Estimated: >60 mL/min (ref >60)
Glucose, Bld: 113 mg/dL — ABNORMAL HIGH (ref 70–99)
Potassium: 3.9 mmol/L (ref 3.5–5.1)
Sodium: 138 mmol/L (ref 135–145)
Total Bilirubin: 1.3 mg/dL — ABNORMAL HIGH (ref 0.3–1.2)
Total Protein: 5.8 g/dL — ABNORMAL LOW (ref 6.5–8.1)

## 2022-05-28 LAB — MAGNESIUM: Magnesium: 1.5 mg/dL — ABNORMAL LOW (ref 1.7–2.4)

## 2022-05-28 MED ORDER — ACETAMINOPHEN 650 MG RE SUPP: 650.0000 mg | Freq: Four times a day (QID) | RECTAL | Status: DC | PRN

## 2022-05-28 MED ORDER — SODIUM CHLORIDE 0.9 % IV BOLUS
1000.0000 mL | Freq: Once | INTRAVENOUS | Status: AC
  Administered 2022-05-28: 1000 mL via INTRAVENOUS

## 2022-05-28 MED ORDER — THIAMINE HCL 100 MG/ML IJ SOLN: 100.0000 mg | Freq: Every day | INTRAMUSCULAR | Status: DC

## 2022-05-28 MED ORDER — ONDANSETRON HCL 4 MG PO TABS: 4.0000 mg | ORAL_TABLET | Freq: Four times a day (QID) | ORAL | Status: DC | PRN

## 2022-05-28 MED ORDER — OXYCODONE HCL 5 MG PO TABS
5.0000 mg | ORAL_TABLET | Freq: Four times a day (QID) | ORAL | Status: DC | PRN
  Administered 2022-05-28 – 2022-05-29 (×3): 5 mg via ORAL

## 2022-05-28 MED ORDER — PANTOPRAZOLE SODIUM 40 MG IV SOLR
40.0000 mg | Freq: Two times a day (BID) | INTRAVENOUS | Status: DC
Start: 2022-05-28 — End: 2022-05-29
  Administered 2022-05-28 (×3): 40 mg via INTRAVENOUS

## 2022-05-28 MED ORDER — ADULT MULTIVITAMIN W/MINERALS CH
1.0000 | ORAL_TABLET | Freq: Every day | ORAL | Status: DC
  Administered 2022-05-28 – 2022-06-01 (×5): 1 via ORAL

## 2022-05-28 MED ORDER — THIAMINE HCL 100 MG PO TABS
100.0000 mg | ORAL_TABLET | Freq: Every day | ORAL | Status: DC
Start: 2022-05-28 — End: 2022-06-01
  Administered 2022-05-28 – 2022-05-31 (×4): 100 mg via ORAL

## 2022-05-28 MED ORDER — ONDANSETRON HCL 4 MG/2ML IJ SOLN
4.0000 mg | Freq: Four times a day (QID) | INTRAMUSCULAR | Status: DC | PRN
  Administered 2022-05-28: 4 mg via INTRAVENOUS

## 2022-05-28 MED ORDER — FOLIC ACID 1 MG PO TABS
1.0000 mg | ORAL_TABLET | Freq: Every day | ORAL | Status: DC
  Administered 2022-05-28 – 2022-06-01 (×5): 1 mg via ORAL

## 2022-05-28 MED ORDER — BICTEGRAVIR-EMTRICITAB-TENOFOV 50-200-25 MG PO TABS
1.0000 | ORAL_TABLET | Freq: Every day | ORAL | 0 refills | Status: DC
  Filled 2022-05-28: qty 30, 30d supply, fill #0

## 2022-05-28 MED ORDER — LORAZEPAM 2 MG/ML IJ SOLN
1.0000 mg | INTRAMUSCULAR | Status: DC | PRN
  Administered 2022-05-28: 4 mg via INTRAVENOUS
  Administered 2022-05-28 – 2022-05-29 (×4): 2 mg via INTRAVENOUS
  Administered 2022-05-29: 1 mg via INTRAVENOUS
  Administered 2022-05-30: 3 mg via INTRAVENOUS
  Administered 2022-05-30 (×2): 2 mg via INTRAVENOUS
  Administered 2022-05-30: 3 mg via INTRAVENOUS

## 2022-05-28 MED ORDER — LORAZEPAM 1 MG PO TABS
1.0000 mg | ORAL_TABLET | ORAL | Status: DC | PRN
  Administered 2022-05-28 – 2022-05-29 (×3): 2 mg via ORAL
  Administered 2022-05-29: 1 mg via ORAL
  Administered 2022-05-29 (×4): 2 mg via ORAL

## 2022-05-28 MED ORDER — CLONIDINE HCL 0.1 MG PO TABS: 0.1000 mg | ORAL_TABLET | ORAL | Status: DC | PRN

## 2022-05-28 MED ORDER — ALUM & MAG HYDROXIDE-SIMETH 200-200-20 MG/5ML PO SUSP
30.0000 mL | Freq: Once | ORAL | Status: AC
  Administered 2022-05-28: 30 mL via ORAL

## 2022-05-28 MED ORDER — METOCLOPRAMIDE HCL 5 MG/ML IJ SOLN
10.0000 mg | Freq: Once | INTRAMUSCULAR | Status: AC
  Administered 2022-05-28: 10 mg via INTRAVENOUS

## 2022-05-28 MED ORDER — HEPARIN SODIUM (PORCINE) 5000 UNIT/ML IJ SOLN: 5000.0000 [IU] | Freq: Three times a day (TID) | INTRAMUSCULAR | Status: DC

## 2022-05-28 MED ORDER — MAGNESIUM SULFATE 2 GM/50ML IV SOLN
2.0000 g | Freq: Once | INTRAVENOUS | Status: AC
  Administered 2022-05-28: 2 g via INTRAVENOUS

## 2022-05-28 MED ORDER — ACETAMINOPHEN 325 MG PO TABS: 650.0000 mg | ORAL_TABLET | Freq: Four times a day (QID) | ORAL | Status: DC | PRN

## 2022-05-28 NOTE — Assessment & Plan Note (Signed)
Observation telemetry bed. Continue with IVF. Likely pt's metabolic acidosis is from alcoholic ketoacidosis. Repeat BMP after IVF overnight.

## 2022-05-28 NOTE — Plan of Care (Signed)
  Problem: Education: Goal: Knowledge of General Education information will improve Description Including pain rating scale, medication(s)/side effects and non-pharmacologic comfort measures Outcome: Progressing   

## 2022-05-28 NOTE — Consult Note (Signed)
[facility]    Date of Admission:  05/27/2022     Reason for Consult: hiv    Referring Provider: Sharolyn Douglas    Abx: Outpatient biktarvy continued here        Assessment: 36 yo male with etoh abuse, hiv currently well controlled (6/08 cd4 244 (40%) and 7/19 hiv viral load undetectable) on biktarvy, homeless, recent pancreatitis admission with imaging pancreatic pseudocyst, admitted for etoh withdrawal and epigastric/right upper abd discomfort and fluctuating diarrhea/solid stool  #hiv At some point off hiv med but started back and well controlled by this admission; cd4% >> 15 Homelessness complicates hiv compliance somewhat  I have spoken with [facility]; he is scheduled this coming Wednesday 7/26 for finance counseling/hmap reengagement He is scheduled with me on 8/30 for ongoing hiv care  We can continue his biktarvy   #etoh abuse #recent pancreatitis; imaging pancreatic cyst #epigastric/ruq discomfort and fluctuating stool habit Suspect gi sx due to etoh abuse. Query chronic pancreatitis with loose stool at times I have low suspicion for an infectious gi illness Lft mildly up in setting etoh use; lipase is much lower now compared to 3 weeks ago with pancreatitis admission  Ddx include gastric ulcer disease  Will defer to primary team for further w/u   #thrombocytopenia Likely etoh related    Plan: Continue biktarvy Once he is discharged he can see me at id clinic on 8/30 @ 10 am He also has [facility] appointment earlier on Wednesday 7/26 for insurance reengagement (if he is still her please let us know to reschedule Will sign off, but please call us if further id question Discuss with primary team  I spent 75 minute reviewing data/chart, and coordinating care and >50% direct face to face time providing counseling/discussing diagnostics/treatment plan with patient    ------------------------------------------------ Principal  Problem:   AKI (acute kidney injury) (HCC) Active Problems:   HIV disease (HCC)   Polysubstance abuse (HCC)   Cigarette smoker   Alcohol use disorder, severe, dependence (HCC)   Metabolic acidosis with increased anion gap and accumulation of organic acids   History of seizures   Homelessness    HPI: Gary [name] is a 36 y.o. male etoh abuse, homeless, hiv controlled on biktarvy here with etoh withdrawal   He was admitted 3 weeks prior to pancreatitis. Biktarvy continued He reports intermittent abd discomfort epigastric/ruq unrelated to meals and fluctuating diarrhea/solid stool  He continues to drink  His presentation condition: Alcohol toxicity/decreased mentation Afebrile Wbc mildly depressed Lft mildly elevated but improved within 24 hours Alkphos normal Platelet low Being treated for etoh withdrawal  Primary team also working up diarrhea and patient reports fluctuating solid/loose stool.   He has hx meth use but uds was negative    Family History  Problem Relation Age of Onset   Alcohol abuse Mother    Alcohol abuse Father    Colon cancer Other    Other Other    Cancer Other     Social History   Tobacco Use   Smoking status: Every Day    Packs/day: 0.50    Types: Cigarettes   Smokeless tobacco: Never   Tobacco comments:    unable to smoke while incarcerated 6+ months 02/13/20  Vaping Use   Vaping Use: Never used  Substance Use Topics   Alcohol use: Yes    Alcohol/week: 105.0 standard drinks of alcohol    Types: 105 Cans of beer per week  Comment: 15 beers/day (105 beers/week), 1 fifth of liquor per day (7/week)   Drug use: Not Currently    Types: Methamphetamines, Marijuana    Comment: last used 04/10/2019    Allergies  Allergen Reactions   Tegretol [Carbamazepine] Other (See Comments)    Caused vertigo for 2 days after taking it   Caffeine Palpitations    Review of Systems: ROS All Other ROS was negative, except mentioned  above   Past Medical History:  Diagnosis Date   Alcohol abuse    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   Anxiety    Bipolar 2 disorder (HCC)    HIV (human immunodeficiency virus infection) (HCC)    PTSD (post-traumatic stress disorder)    Schizophrenia (HCC)    Seizures (HCC)    Subdural hematoma (HCC)        Scheduled Meds:  bictegravir-emtricitabine-tenofovir AF  1 tablet Oral Daily   folic acid  1 mg Oral Daily   levETIRAcetam  500 mg Oral BID   multivitamin with minerals  1 tablet Oral Daily   pantoprazole (PROTONIX) IV  40 mg Intravenous Q12H   thiamine  100 mg Oral Daily   Or   thiamine  100 mg Intravenous Daily   Continuous Infusions:  dextrose 5% lactated ringers 100 mL/hr at 05/28/22 1312   magnesium sulfate bolus IVPB     PRN Meds:.acetaminophen **OR** acetaminophen, cloNIDine, LORazepam **OR** LORazepam, ondansetron **OR** ondansetron (ZOFRAN) IV   OBJECTIVE: Blood pressure 111/78, pulse (!) 109, temperature 98.2 F (36.8 C), resp. rate 17, height 6' (1.829 m), weight 68 kg, SpO2 98 %.  Physical Exam  General/constitutional: no distress, conversant; cooperative; thin; somnolence HEENT: Normocephalic, PER, Conj Clear, EOMI, Oropharynx clear Neck supple CV: rrr no mrg Lungs: clear to auscultation, normal respiratory effort Abd: Soft, Nontender Ext: no edema Skin: No Rash Neuro: nonfocal MSK: no peripheral joint swelling/tenderness/warmth; back spines nontender     Lab Results Lab Results  Component Value Date   WBC 3.5 (L) 05/28/2022   HGB 13.1 05/28/2022   HCT 37.6 (L) 05/28/2022   MCV 97.7 05/28/2022   PLT 86 (L) 05/28/2022    Lab Results  Component Value Date   CREATININE 0.74 05/28/2022   BUN 9 05/28/2022   NA 138 05/28/2022   K 3.9 05/28/2022   CL 107 05/28/2022   CO2 20 (L) 05/28/2022    Lab Results  Component Value Date   ALT 40 05/28/2022   AST 68 (H) 05/28/2022   ALKPHOS 47 05/28/2022   BILITOT 1.3 (H) 05/28/2022       Microbiology: Recent Results (from the past 240 hour(s))  Resp Panel by RT-PCR (Flu A&B, Covid) Anterior Nasal Swab     Status: None   Collection Time: 05/20/22  8:19 PM   Specimen: Anterior Nasal Swab  Result Value Ref Range Status   SARS Coronavirus 2 by RT PCR NEGATIVE NEGATIVE Final    Comment: (NOTE) SARS-CoV-2 target nucleic acids are NOT DETECTED.  The SARS-CoV-2 RNA is generally detectable in upper respiratory specimens during the acute phase of infection. The lowest concentration of SARS-CoV-2 viral copies this assay can detect is 138 copies/mL. A negative result does not preclude SARS-Cov-2 infection and should not be used as the sole basis for treatment or other patient management decisions. A negative result may occur with  improper specimen collection/handling, submission of specimen other than nasopharyngeal swab, presence of viral mutation(s) within the areas targeted by this assay, and inadequate number of viral copies(<138 copies/mL). A  negative result must be combined with clinical observations, patient history, and epidemiological information. The expected result is Negative.  Fact Sheet for Patients:  BloggerCourse.com  Fact Sheet for Healthcare Providers:  SeriousBroker.it  This test is no t yet approved or cleared by the [place] FDA and  has been authorized for detection and/or diagnosis of SARS-CoV-2 by FDA under an Emergency Use Authorization (EUA). This EUA will remain  in effect (meaning this test can be used) for the duration of the COVID-19 declaration under Section 564(b)(1) of the Act, 21 U.S.C.section 360bbb-3(b)(1), unless the authorization is terminated  or revoked sooner.       Influenza A by PCR NEGATIVE NEGATIVE Final   Influenza B by PCR NEGATIVE NEGATIVE Final    Comment: (NOTE) The Xpert Xpress SARS-CoV-2/FLU/RSV plus assay is intended as an aid in the diagnosis of influenza  from Nasopharyngeal swab specimens and should not be used as a sole basis for treatment. Nasal washings and aspirates are unacceptable for Xpert Xpress SARS-CoV-2/FLU/RSV testing.  Fact Sheet for Patients: BloggerCourse.com  Fact Sheet for Healthcare Providers: SeriousBroker.it  This test is not yet approved or cleared by the [place] FDA and has been authorized for detection and/or diagnosis of SARS-CoV-2 by FDA under an Emergency Use Authorization (EUA). This EUA will remain in effect (meaning this test can be used) for the duration of the COVID-19 declaration under Section 564(b)(1) of the Act, 21 U.S.C. section 360bbb-3(b)(1), unless the authorization is terminated or revoked.  Performed at [facility], [address], [place], [place] [address]      Serology:    Imaging: If present, new imagings (plain films, ct scans, and mri) have been personally visualized and interpreted; radiology reports have been reviewed. Decision making incorporated into the Impression / Recommendations.  none  Raymondo Band, MD [facility] Infect[facility] [phone] pager    05/28/2022, 1:54 PM

## 2022-05-28 NOTE — Assessment & Plan Note (Signed)
Awaiting urine drug screen.  History of crystal amphetamine abuse.

## 2022-05-28 NOTE — Subjective & Objective (Signed)
CC: wants alcohol detox HPI: 36 yo WM with hx of HIV, chronic homelessness, chronic alchol abuse, drug abuse, presents to ER with acute alcohol withdrawal. pt last etoh consumption was yesterday morning. pt states he normally drinks 1/5 to 1/2 gallon of liquor. pt state he "lost" his HIV meds and seizure meds. work up in ER shows SCr of 1.29. baseline SCr 0.8.  bicarb of 14.  pt states he wants to stop alcohol use. was last in detox treatment about 1 year ago. sobriety only lasted 14 days.  pt does not seen any healthcare providers on a regular basis. he gets all of his medication from hospital. he does not use any outpatient pharmacies. he is entirely dependent on ER and hospital for his healthcare.

## 2022-05-28 NOTE — Assessment & Plan Note (Signed)
Chronic.  He states his mother is aware that he is chronically homeless.

## 2022-05-28 NOTE — H&P (Signed)
History and Physical    Gary [name] QPR:[id number] DOB: 02/16/1986 DOA: 05/27/2022  DOS: the patient was seen and examined on 05/27/2022  PCP: Pcp, No   Patient coming from: Home  I have personally briefly reviewed patient's old medical records in [facility]  CC: wants alcohol detox HPI: 36 yo WM with hx of HIV, chronic homelessness, chronic alchol abuse, drug abuse, presents to ER with acute alcohol withdrawal. pt last etoh consumption was yesterday morning. pt states he normally drinks 1/5 to 1/2 gallon of liquor. pt state he "lost" his HIV meds and seizure meds. work up in ER shows SCr of 1.29. baseline SCr 0.8.  bicarb of 14.  pt states he wants to stop alcohol use. was last in detox treatment about 1 year ago. sobriety only lasted 14 days.  pt does not seen any healthcare providers on a regular basis. he gets all of his medication from hospital. he does not use any outpatient pharmacies. he is entirely dependent on ER and hospital for his healthcare.   ED Course: Labs showed acute kidney injury.  Scr 1.29.  Alcohol level 177.  Serum bicarbonate 14.  Review of Systems:  Review of Systems  Constitutional: Negative.   HENT: Negative.    Eyes: Negative.   Respiratory: Negative.    Cardiovascular: Negative.   Gastrointestinal:  Positive for abdominal pain.  Genitourinary: Negative.   Musculoskeletal: Negative.   Skin: Negative.   Neurological:  Positive for tremors.  Endo/Heme/Allergies: Negative.   Psychiatric/Behavioral:  Positive for substance abuse.   All other systems reviewed and are negative.   Past Medical History:  Diagnosis Date   Alcohol abuse    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   Anxiety    Bipolar 2 disorder (HCC)    HIV (human immunodeficiency virus infection) (HCC)    PTSD (post-traumatic stress disorder)    Schizophrenia (HCC)    Seizures (HCC)    Subdural hematoma (HCC)     Past Surgical History:  Procedure Laterality Date   BIOPSY   04/19/2022   Procedure: BIOPSY;  Surgeon: Lemar Lofty., MD;  Location: [facility] ENDOSCOPY;  Service: Gastroenterology;;   ENTEROSCOPY N/A 04/19/2022   Procedure: ENTEROSCOPY;  Surgeon: Lemar Lofty., MD;  Location: [facility] ENDOSCOPY;  Service: Gastroenterology;  Laterality: N/A;   INCISION AND DRAINAGE PERIRECTAL ABSCESS N/A 09/24/2016   Procedure: IRRIGATION AND DEBRIDEMENT PERIRECTAL ABSCESS;  Surgeon: Ricarda Frame, MD;  Location: [facility] ORS;  Service: General;  Laterality: N/A;   none       reports that he has been smoking cigarettes. He has been smoking an average of .5 packs per day. He has never used smokeless tobacco. He reports current alcohol use of about 105.0 standard drinks of alcohol per week. He reports that he does not currently use drugs after having used the following drugs: Methamphetamines and Marijuana.  Allergies  Allergen Reactions   Tegretol [Carbamazepine] Other (See Comments)    Caused vertigo for 2 days after taking it   Caffeine Palpitations    Family History  Problem Relation Age of Onset   Alcohol abuse Mother    Alcohol abuse Father    Colon cancer Other    Other Other    Cancer Other     Prior to Admission medications   Medication Sig Start Date End Date Taking? Authorizing Provider  bictegravir-emtricitabine-tenofovir AF (BIKTARVY) 50-200-25 MG TABS tablet Take 1 tablet by mouth daily. 05/01/22   Kathlen Mody, MD  chlordiazePOXIDE (LIBRIUM) 25 MG capsule  50mg  PO TID x 1D, then 25-50mg  PO BID X 1D, then 25-50mg  PO QD X 1D Patient not taking: Reported on 05/21/2022 05/16/22   Mesner, Corene Cornea, MD  folic acid (FOLVITE) 1 MG tablet Take 1 tablet (1 mg total) by mouth daily. 05/01/22   Hosie Poisson, MD  gabapentin (NEURONTIN) 300 MG capsule Take 1 capsule (300 mg total) by mouth 3 (three) times daily. 05/01/22   Hosie Poisson, MD  levETIRAcetam (KEPPRA) 500 MG tablet Take 1 tablet (500 mg total) by mouth 2 (two) times daily. Patient taking differently:  Take 500 mg by mouth daily in the afternoon. 05/01/22   Hosie Poisson, MD  Multiple Vitamin (MULTIVITAMIN WITH MINERALS) TABS tablet Take 1 tablet by mouth daily. 05/02/22   Hosie Poisson, MD  ondansetron (ZOFRAN) 4 MG tablet Take 1 tablet (4 mg total) by mouth every 8 (eight) hours as needed for nausea or vomiting. Patient not taking: Reported on 05/21/2022 05/01/22   Hosie Poisson, MD  pantoprazole (PROTONIX) 40 MG tablet Take 1 tablet (40 mg total) by mouth 2 (two) times daily. Patient not taking: Reported on 05/21/2022 05/01/22   Hosie Poisson, MD  polyethylene glycol powder (GLYCOLAX/MIRALAX) 17 GM/SCOOP powder Use 1 capful (17 g) by mouth daily as needed for mild constipation. Patient not taking: Reported on 05/21/2022 05/01/22   Hosie Poisson, MD  senna-docusate (SENOKOT-S) 8.6-50 MG tablet Take 1 tablet by mouth 2 (two) times daily. Patient not taking: Reported on 05/21/2022 05/01/22   Hosie Poisson, MD  thiamine 100 MG tablet Take 1 tablet (100 mg total) by mouth daily. 05/01/22   Hosie Poisson, MD  famotidine (PEPCID) 20 MG tablet Take 1 tablet (20 mg total) by mouth 2 (two) times daily. Patient not taking: Reported on 06/01/2019 05/14/19 06/02/19  Davonna Belling, MD    Physical Exam: Vitals:   05/28/22 0115 05/28/22 0130 05/28/22 0145 05/28/22 0200  BP: 117/76 122/74 120/79 120/78  Pulse: (!) 130 (!) 123 (!) 122 (!) 115  Resp: 20 (!) 27 (!) 30 (!) 23  Temp:      TempSrc:      SpO2: 99% 97% 99% 98%  Weight:      Height:        Physical Exam Vitals and nursing note reviewed.  Constitutional:      General: He is not in acute distress.    Appearance: Normal appearance. He is not ill-appearing, toxic-appearing or diaphoretic.  HENT:     Head: Normocephalic and atraumatic.     Nose: Nose normal. No rhinorrhea.  Cardiovascular:     Rate and Rhythm: Regular rhythm. Tachycardia present.  Pulmonary:     Effort: Pulmonary effort is normal. No respiratory distress.     Breath sounds: No  wheezing or rales.  Abdominal:     General: Abdomen is flat. Bowel sounds are normal.     Tenderness: There is generalized abdominal tenderness. There is no guarding or rebound.  Musculoskeletal:     Right lower leg: No edema.     Left lower leg: No edema.  Skin:    General: Skin is warm and dry.     Capillary Refill: Capillary refill takes less than 2 seconds.     Findings: Erythema present.  Neurological:     General: No focal deficit present.     Mental Status: He is alert and oriented to person, place, and time.     Motor: Tremor present.      Labs on Admission: I have personally reviewed  following labs and imaging studies  CBC: Recent Labs  Lab 05/27/22 2151 05/27/22 2207  WBC 5.9  --   NEUTROABS 5.1  --   HGB 14.3 15.0  HCT 43.4 44.0  MCV 101.9*  --   PLT 107*  --    Basic Metabolic Panel: Recent Labs  Lab 05/27/22 2151 05/27/22 2207  NA 143 140  K 4.4 4.2  CL 105 104  CO2 14*  --   GLUCOSE 119* 122*  BUN 15 15  CREATININE 1.29* 1.20  CALCIUM 8.7*  --    GFR: Estimated Creatinine Clearance: 82.6 mL/min (by C-G formula based on SCr of 1.2 mg/dL). Liver Function Tests: Recent Labs  Lab 05/27/22 2151  AST 115*  ALT 51*  ALKPHOS 56  BILITOT 1.1  PROT 7.0  ALBUMIN 4.0   Recent Labs  Lab 05/27/22 2151  LIPASE 103*   No results for input(s): "AMMONIA" in the last 168 hours. Coagulation Profile: No results for input(s): "INR", "PROTIME" in the last 168 hours. Cardiac Enzymes: No results for input(s): "CKTOTAL", "CKMB", "CKMBINDEX", "TROPONINI", "TROPONINIHS" in the last 168 hours. BNP (last 3 results) No results for input(s): "PROBNP" in the last 8760 hours. HbA1C: No results for input(s): "HGBA1C" in the last 72 hours. CBG: No results for input(s): "GLUCAP" in the last 168 hours. Lipid Profile: No results for input(s): "CHOL", "HDL", "LDLCALC", "TRIG", "CHOLHDL", "LDLDIRECT" in the last 72 hours. Thyroid Function Tests: No results for  input(s): "TSH", "T4TOTAL", "FREET4", "T3FREE", "THYROIDAB" in the last 72 hours. Anemia Panel: No results for input(s): "VITAMINB12", "FOLATE", "FERRITIN", "TIBC", "IRON", "RETICCTPCT" in the last 72 hours. Urine analysis:    Component Value Date/Time   COLORURINE YELLOW 04/26/2022 0625   APPEARANCEUR HAZY (A) 04/26/2022 0625   APPEARANCEUR Clear 11/15/2014 1755   LABSPEC 1.019 04/26/2022 0625   LABSPEC 1.002 11/15/2014 1755   PHURINE 5.0 04/26/2022 0625   GLUCOSEU NEGATIVE 04/26/2022 0625   GLUCOSEU Negative 11/15/2014 1755   HGBUR SMALL (A) 04/26/2022 0625   BILIRUBINUR NEGATIVE 04/26/2022 0625   BILIRUBINUR Negative 11/15/2014 1755   KETONESUR 20 (A) 04/26/2022 0625   PROTEINUR 30 (A) 04/26/2022 0625   NITRITE NEGATIVE 04/26/2022 0625   LEUKOCYTESUR NEGATIVE 04/26/2022 0625   LEUKOCYTESUR Negative 11/15/2014 1755    Radiological Exams on Admission: I have personally reviewed images No results found.  EKG: My personal interpretation of EKG shows: sinus tachycardia    Assessment/Plan Principal Problem:   AKI (acute kidney injury) (HCC) Active Problems:   HIV disease (HCC)   Polysubstance abuse (HCC)   Cigarette smoker   Alcohol use disorder, severe, dependence (HCC)   Metabolic acidosis with increased anion gap and accumulation of organic acids   History of seizures   Homelessness   Assessment and Plan: * AKI (acute kidney injury) (HCC) Observation telemetry bed. Continue with IVF. Likely pt's metabolic acidosis is from alcoholic ketoacidosis. Repeat BMP after IVF overnight.  Homelessness Chronic.  He states his mother is aware that he is chronically homeless.  History of seizures Unclear if he actually has a true seizure disorder versus he had an alcohol withdrawal seizure.  He has been without medications for several days now.  Metabolic acidosis with increased anion gap and accumulation of organic acids Repeat BMP after IV fluid resuscitation.  Alcohol use  disorder, severe, dependence (HCC) Start CIWA protocol.  Will need outpatient resources but given his poor social situation, unlikely he will qualify for any residential detox program.  Patient states that his  mother is aware he is homeless.  Did not pursue any further history as the patient seemed he want to avoid the subject.  Cigarette smoker Patient declines nicotine patch.  Polysubstance abuse ([place]) Awaiting urine drug screen.  History of crystal amphetamine abuse.  HIV disease ([place]) Patient's been out of his HIV meds for several days now.  He does not follow-up with infectious disease on a regular basis.  May need input from infectious disease on whether or not continued HAART is recommended in this patient who does not have any follow-up and does not see any providers on a regular basis.   DVT prophylaxis: SQ Heparin Code Status: Full Code Family Communication: no family at bedside  Disposition Plan: return home  Consults called: none  Admission status: Observation, Telemetry bed   Kristopher Oppenheim, DO [facility] 05/28/2022, 2:15 AM

## 2022-05-28 NOTE — Assessment & Plan Note (Signed)
Start CIWA protocol.  Will need outpatient resources but given his poor social situation, unlikely he will qualify for any residential detox program.  Patient states that his mother is aware he is homeless.  Did not pursue any further history as the patient seemed he want to avoid the subject.

## 2022-05-28 NOTE — ED Notes (Signed)
Patient ambulated to bedside commode. Patient has very watery diarrhea and some severe abdominal pain. MD made aware.

## 2022-05-28 NOTE — Assessment & Plan Note (Signed)
Patient's been out of his HIV meds for several days now.  He does not follow-up with infectious disease on a regular basis.  May need input from infectious disease on whether or not continued HAART is recommended in this patient who does not have any follow-up and does not see any providers on a regular basis.

## 2022-05-28 NOTE — Assessment & Plan Note (Signed)
Unclear if he actually has a true seizure disorder versus he had an alcohol withdrawal seizure.  He has been without medications for several days now.

## 2022-05-28 NOTE — Progress Notes (Signed)
PROGRESS NOTE  Gary [name] BPZ:[id number] DOB: 10/01/1986 DOA: 05/27/2022 PCP: Pcp, No  HPI/Recap of past 24 hours: 36 yo male with hx of HIV, chronic homelessness, chronic alchol abuse, drug abuse, presents to ER with acute alcohol intoxication and possible withdrawal. Pt states he normally drinks 1/5 to 1/2 gallon of liquor. pt state he "lost" his HIV meds and seizure meds. work up in ER shows SCr of 1.29. baseline SCr 0.8.  bicarb of 14.  pt states he wants to stop alcohol use. was last in detox treatment about 1 year ago. sobriety only lasted 14 days. Pt does not see any healthcare providers on a regular basis, he gets all of his medication from hospital. Pt is entirely dependent on ER and hospital for his healthcare. In the ED, labs showed acute kidney injury.  Scr 1.29.  Alcohol level 177.  Serum bicarbonate 14.  Patient admitted for further management.   Today, pt reported feeling poorly overall, still with diarrhea, abdominal pain, n/v. Noted to be persistently tachycardic, in withdrawal.   Assessment/Plan: Principal Problem:   AKI (acute kidney injury) (HCC) Active Problems:   HIV disease (HCC)   Polysubstance abuse (HCC)   Cigarette smoker   Alcohol use disorder, severe, dependence (HCC)   Metabolic acidosis with increased anion gap and accumulation of organic acids   History of seizures   Homelessness   Alcohol use disorder, severe, dependence (HCC) Alcohol withdrawal CIWA protocol MVT, Folic acid, Thiamine Advised to quit, SW consult Telemetry  AKI (acute kidney injury) (HCC) Improved Continue IVF Daily BMP  Metabolic acidosis with increased anion gap Improving Likely 2/2 heavy alcohol use Daily BMP  Hypomagnesemia Replace as needed  Diarrhea Likely 2/2 withdrawal, rule out any infective cause GI panel pending IV fluids Monitor closely  Thrombocytopenia-chronic Likely 2/2 alcohol abuse Daily CBC  Mild transaminitis Likely due to alcohol  abuse  HIV disease (HCC) Patient's been out of his HIV meds for several days He does not follow-up with infectious disease on a regular basis May consult ID  Homelessness Chronic SW consult   History of seizures Unclear if he actually has a true seizure disorder versus he had an alcohol withdrawal seizure. He has been without medications for several days now Continue Keppra  Tobacco abuse Patient declines nicotine patch Advised to quit   Polysubstance abuse (HCC) History of crystal amphetamine abuse UDS negative     Estimated body mass index is 20.34 kg/m as calculated from the following:   Height as of this encounter: 6' (1.829 m).   Weight as of this encounter: 68 kg.     Code Status: Full  Family Communication: None at bedside  Disposition Plan: Status is: Observation The patient will require care spanning > 2 midnights and should be moved to inpatient because: Level of care      Consultants: None  Procedures: None  Antimicrobials: None  DVT prophylaxis: SCD due to thrombocytopenia   Objective: Vitals:   05/28/22 0944 05/28/22 1100 05/28/22 1200 05/28/22 1208  BP: 109/68 106/74 103/74 106/74  Pulse: (!) 105 (!) 103 99 (!) 103  Resp:  16 (!) 24   Temp:      TempSrc:      SpO2:  96% 96%   Weight:      Height:        Intake/Output Summary (Last 24 hours) at 05/28/2022 1224 Last data filed at 05/28/2022 0521 Gross per 24 hour  Intake 703.22 ml  Output 1000 ml  Net -296.78 ml   Filed Weights   05/27/22 2228  Weight: 68 kg    Exam: General: NAD, acutely ill-appearing, unkept Cardiovascular: S1, S2 present Respiratory: CTAB Abdomen: Soft, nontender, nondistended, bowel sounds present Musculoskeletal: No bilateral pedal edema noted Skin: Normal Psychiatry: Fair mood     Data Reviewed: CBC: Recent Labs  Lab 05/27/22 2151 05/27/22 2207 05/28/22 0457  WBC 5.9  --  3.5*  NEUTROABS 5.1  --  2.5  HGB 14.3 15.0 13.1  HCT 43.4 44.0  37.6*  MCV 101.9*  --  97.7  PLT 107*  --  86*   Basic Metabolic Panel: Recent Labs  Lab 05/27/22 2151 05/27/22 2207 05/28/22 0457  NA 143 140 138  K 4.4 4.2 3.9  CL 105 104 107  CO2 14*  --  20*  GLUCOSE 119* 122* 113*  BUN 15 15 9   CREATININE 1.29* 1.20 0.74  CALCIUM 8.7*  --  7.7*  MG  --   --  1.5*   GFR: Estimated Creatinine Clearance: 124 mL/min (by C-G formula based on SCr of 0.74 mg/dL). Liver Function Tests: Recent Labs  Lab 05/27/22 2151 05/28/22 0457  AST 115* 68*  ALT 51* 40  ALKPHOS 56 47  BILITOT 1.1 1.3*  PROT 7.0 5.8*  ALBUMIN 4.0 3.3*   Recent Labs  Lab 05/27/22 2151  LIPASE 103*   No results for input(s): "AMMONIA" in the last 168 hours. Coagulation Profile: No results for input(s): "INR", "PROTIME" in the last 168 hours. Cardiac Enzymes: No results for input(s): "CKTOTAL", "CKMB", "CKMBINDEX", "TROPONINI" in the last 168 hours. BNP (last 3 results) No results for input(s): "PROBNP" in the last 8760 hours. HbA1C: No results for input(s): "HGBA1C" in the last 72 hours. CBG: No results for input(s): "GLUCAP" in the last 168 hours. Lipid Profile: No results for input(s): "CHOL", "HDL", "LDLCALC", "TRIG", "CHOLHDL", "LDLDIRECT" in the last 72 hours. Thyroid Function Tests: No results for input(s): "TSH", "T4TOTAL", "FREET4", "T3FREE", "THYROIDAB" in the last 72 hours. Anemia Panel: No results for input(s): "VITAMINB12", "FOLATE", "FERRITIN", "TIBC", "IRON", "RETICCTPCT" in the last 72 hours. Urine analysis:    Component Value Date/Time   COLORURINE YELLOW 04/26/2022 0625   APPEARANCEUR HAZY (A) 04/26/2022 0625   APPEARANCEUR Clear 11/15/2014 1755   LABSPEC 1.019 04/26/2022 0625   LABSPEC 1.002 11/15/2014 1755   PHURINE 5.0 04/26/2022 0625   GLUCOSEU NEGATIVE 04/26/2022 0625   GLUCOSEU Negative 11/15/2014 1755   HGBUR SMALL (A) 04/26/2022 0625   BILIRUBINUR NEGATIVE 04/26/2022 0625   BILIRUBINUR Negative 11/15/2014 1755   KETONESUR 20  (A) 04/26/2022 0625   PROTEINUR 30 (A) 04/26/2022 0625   NITRITE NEGATIVE 04/26/2022 0625   LEUKOCYTESUR NEGATIVE 04/26/2022 0625   LEUKOCYTESUR Negative 11/15/2014 1755   Sepsis Labs: @LABRCNTIP (procalcitonin:4,lacticidven:4)  ) Recent Results (from the past 240 hour(s))  Resp Panel by RT-PCR (Flu A&B, Covid) Anterior Nasal Swab     Status: None   Collection Time: 05/20/22  8:19 PM   Specimen: Anterior Nasal Swab  Result Value Ref Range Status   SARS Coronavirus 2 by RT PCR NEGATIVE NEGATIVE Final    Comment: (NOTE) SARS-CoV-2 target nucleic acids are NOT DETECTED.  The SARS-CoV-2 RNA is generally detectable in upper respiratory specimens during the acute phase of infection. The lowest concentration of SARS-CoV-2 viral copies this assay can detect is 138 copies/mL. A negative result does not preclude SARS-Cov-2 infection and should not be used as the sole basis for treatment or other patient management decisions.  A negative result may occur with  improper specimen collection/handling, submission of specimen other than nasopharyngeal swab, presence of viral mutation(s) within the areas targeted by this assay, and inadequate number of viral copies(<138 copies/mL). A negative result must be combined with clinical observations, patient history, and epidemiological information. The expected result is Negative.  Fact Sheet for Patients:  BloggerCourse.com  Fact Sheet for Healthcare Providers:  SeriousBroker.it  This test is no t yet approved or cleared by the [place] FDA and  has been authorized for detection and/or diagnosis of SARS-CoV-2 by FDA under an Emergency Use Authorization (EUA). This EUA will remain  in effect (meaning this test can be used) for the duration of the COVID-19 declaration under Section 564(b)(1) of the Act, 21 U.S.C.section 360bbb-3(b)(1), unless the authorization is terminated  or revoked sooner.        Influenza A by PCR NEGATIVE NEGATIVE Final   Influenza B by PCR NEGATIVE NEGATIVE Final    Comment: (NOTE) The Xpert Xpress SARS-CoV-2/FLU/RSV plus assay is intended as an aid in the diagnosis of influenza from Nasopharyngeal swab specimens and should not be used as a sole basis for treatment. Nasal washings and aspirates are unacceptable for Xpert Xpress SARS-CoV-2/FLU/RSV testing.  Fact Sheet for Patients: BloggerCourse.com  Fact Sheet for Healthcare Providers: SeriousBroker.it  This test is not yet approved or cleared by the [place] FDA and has been authorized for detection and/or diagnosis of SARS-CoV-2 by FDA under an Emergency Use Authorization (EUA). This EUA will remain in effect (meaning this test can be used) for the duration of the COVID-19 declaration under Section 564(b)(1) of the Act, 21 U.S.C. section 360bbb-3(b)(1), unless the authorization is terminated or revoked.  Performed at [facility], [address] [place], [place] [address]       Studies: No results found.  Scheduled Meds:  bictegravir-emtricitabine-tenofovir AF  1 tablet Oral Daily   folic acid  1 mg Oral Daily   levETIRAcetam  500 mg Oral BID   multivitamin with minerals  1 tablet Oral Daily   pantoprazole (PROTONIX) IV  40 mg Intravenous Q12H   thiamine  100 mg Oral Daily   Or   thiamine  100 mg Intravenous Daily    Continuous Infusions:  dextrose 5% lactated ringers 100 mL/hr at 05/28/22 0835     LOS: 0 days     Briant Cedar, MD [facility]  If 7PM-7AM, please contact night-coverage [email or web address] 05/28/2022, 12:24 PM

## 2022-05-28 NOTE — Assessment & Plan Note (Signed)
Repeat BMP after IV fluid resuscitation.

## 2022-05-28 NOTE — Assessment & Plan Note (Signed)
Patient declines nicotine patch.

## 2022-05-29 ENCOUNTER — Other Ambulatory Visit (HOSPITAL_COMMUNITY): Payer: Self-pay

## 2022-05-29 ENCOUNTER — Inpatient Hospital Stay: Payer: Self-pay | Admitting: Nurse Practitioner

## 2022-05-29 ENCOUNTER — Observation Stay (HOSPITAL_COMMUNITY): Payer: Self-pay

## 2022-05-29 DIAGNOSIS — F10939 Alcohol use, unspecified with withdrawal, unspecified: Secondary | ICD-10-CM | POA: Diagnosis present

## 2022-05-29 LAB — BASIC METABOLIC PANEL
Anion gap: 9 (ref 5–15)
BUN: <5 mg/dL — ABNORMAL LOW (ref 6–20)
CO2: 24 mmol/L (ref 22–32)
Calcium: 8.1 mg/dL — ABNORMAL LOW (ref 8.9–10.3)
Chloride: 103 mmol/L (ref 98–111)
Creatinine, Ser: 0.68 mg/dL (ref 0.61–1.24)
GFR, Estimated: >60 mL/min (ref >60)
Glucose, Bld: 107 mg/dL — ABNORMAL HIGH (ref 70–99)
Potassium: 3 mmol/L — ABNORMAL LOW (ref 3.5–5.1)
Sodium: 136 mmol/L (ref 135–145)

## 2022-05-29 LAB — CBC WITH DIFFERENTIAL/PLATELET
Abs Immature Granulocytes: 0 10*3/uL (ref 0.00–0.07)
Basophils Absolute: 0 10*3/uL (ref 0.0–0.1)
Basophils Relative: 1 %
Eosinophils Absolute: 0.1 10*3/uL (ref 0.0–0.5)
Eosinophils Relative: 3 %
HCT: 35.6 % — ABNORMAL LOW (ref 39.0–52.0)
Hemoglobin: 12.2 g/dL — ABNORMAL LOW (ref 13.0–17.0)
Immature Granulocytes: 0 %
Lymphocytes Relative: 39 %
Lymphs Abs: 0.7 10*3/uL (ref 0.7–4.0)
MCH: 33.2 pg (ref 26.0–34.0)
MCHC: 34.3 g/dL (ref 30.0–36.0)
MCV: 96.7 fL (ref 80.0–100.0)
Monocytes Absolute: 0.2 10*3/uL (ref 0.1–1.0)
Monocytes Relative: 9 %
Neutro Abs: 0.9 10*3/uL — ABNORMAL LOW (ref 1.7–7.7)
Neutrophils Relative %: 48 %
Platelets: 67 10*3/uL — ABNORMAL LOW (ref 150–400)
RBC: 3.68 MIL/uL — ABNORMAL LOW (ref 4.22–5.81)
RDW: 13.4 % (ref 11.5–15.5)
WBC: 1.8 10*3/uL — ABNORMAL LOW (ref 4.0–10.5)
nRBC: 0 % (ref 0.0–0.2)

## 2022-05-29 LAB — PATHOLOGIST SMEAR REVIEW

## 2022-05-29 LAB — LEVETIRACETAM LEVEL: Levetiracetam Lvl: <2 ug/mL — ABNORMAL LOW (ref 10.0–40.0)

## 2022-05-29 LAB — MAGNESIUM: Magnesium: 1.7 mg/dL (ref 1.7–2.4)

## 2022-05-29 MED ORDER — TRAZODONE HCL 50 MG PO TABS
25.0000 mg | ORAL_TABLET | Freq: Once | ORAL | Status: AC
  Administered 2022-05-29: 25 mg via ORAL

## 2022-05-29 MED ORDER — PANTOPRAZOLE SODIUM 40 MG PO TBEC
40.0000 mg | DELAYED_RELEASE_TABLET | Freq: Two times a day (BID) | ORAL | Status: DC
  Administered 2022-05-29 – 2022-06-01 (×7): 40 mg via ORAL

## 2022-05-29 MED ORDER — POTASSIUM CHLORIDE CRYS ER 20 MEQ PO TBCR
40.0000 meq | EXTENDED_RELEASE_TABLET | Freq: Two times a day (BID) | ORAL | Status: AC
  Administered 2022-05-29 (×2): 40 meq via ORAL

## 2022-05-29 NOTE — Plan of Care (Signed)
  Problem: Education: Goal: Knowledge of General Education information will improve Description Including pain rating scale, medication(s)/side effects and non-pharmacologic comfort measures Outcome: Progressing   

## 2022-05-29 NOTE — Progress Notes (Addendum)
Initial Nutrition Assessment  DOCUMENTATION CODES:   Not applicable  INTERVENTION:   Ensure Enlive po TID, each supplement provides 350 kcal and 20 grams of protein.  MVI, folic acid and thiamine   Pt at high refeed risk; recommend monitor potassium, magnesium and phosphorus labs daily until stable  NUTRITION DIAGNOSIS:   Inadequate oral intake related to social / environmental circumstances (substance abuse, mental illness, homelessness, pancreatitis) as evidenced by per patient/family report.  GOAL:   Patient will meet greater than or equal to 90% of their needs  MONITOR:   PO intake, Supplement acceptance, Labs, Weight trends, Skin, I & O's  REASON FOR ASSESSMENT:   Malnutrition Screening Tool    ASSESSMENT:   36 y/o male with h/o substance and etoh abuse, seizures, bipolar, chronic pancreatitis, schizophrenia, MDD, anxiety, HIV, PTSD, PUD and homelessness who is admitted with AKI and alcohol withdrawal.  RD working remotely.  Spoke with pt via phone. Pt reports poor appetite and oral intake for 3 days pta r/t nausea. Pt reports that his nausea is improved today but his oral intake continues to be poor. Pt reports that he is eating ~50% of his meals in hospital. RD discussed with pt the importance of adequate nutrition needed to preserve lean muscle. Pt reports that he likes chocolate Ensure and will drink this in hospital. RD will add supplements to help pt meet his estimated needs. Pt is at high refeed risk. Per chart, pt appears weight stable pta. RD will obtain nutrition related history and exam at follow up.   Medications reviewed and include: folic acid, MVI, protonix, Kcl, thiamine, LRS w/ 5% dextrose @100ml /hr   Labs reviewed: K 3.0(L), BUN <5(L), iCa 1.06(L), Mg 1.7 wnl Wbc- 1.8(L)  NUTRITION - FOCUSED PHYSICAL EXAM: Unable to perform at this time   Diet Order:   Diet Order             Diet regular Room service appropriate? Yes; Fluid consistency: Thin   Diet effective now                  EDUCATION NEEDS:   No education needs have been identified at this time  Skin:  Skin Assessment: Reviewed RN Assessment (ecchymosis)  Last BM:  7/20  Height:   Ht Readings from Last 1 Encounters:  05/27/22 6' (1.829 m)    Weight:   Wt Readings from Last 1 Encounters:  05/27/22 68 kg    Ideal Body Weight:  80.9 kg  BMI:  Body mass index is 20.34 kg/m.  Estimated Nutritional Needs:   Kcal:  2100-2400kcal/day  Protein:  105-120g/day  Fluid:  2.1-2.4L/day  05/29/22 MS, RD, LDN Please refer to [facility] for RD and/or RD on-call/weekend/after hours pager

## 2022-05-29 NOTE — Progress Notes (Addendum)
PROGRESS NOTE  Gary [name] IOX:[id number] DOB: 1986/07/23 DOA: 05/27/2022 PCP: Pcp, No  HPI/Recap of past 24 hours: 36 yo male with hx of HIV, chronic homelessness, chronic alchol abuse, drug abuse, presents to ER with acute alcohol intoxication and possible withdrawal. Pt states he normally drinks 1/5 to 1/2 gallon of liquor. pt state he "lost" his HIV meds and seizure meds. work up in ER shows SCr of 1.29. baseline SCr 0.8.  bicarb of 14.  pt states he wants to stop alcohol use. was last in detox treatment about 1 year ago. sobriety only lasted 14 days. Pt does not see any healthcare providers on a regular basis, he gets all of his medication from hospital. Pt is entirely dependent on ER and hospital for his healthcare. In the ED, labs showed acute kidney injury.  Scr 1.29.  Alcohol level 177.  Serum bicarbonate 14.  Patient admitted for further management.    Patient reports feeling poorly overall but continues to complain of " withdrawal", also reports diarrhea.   Assessment/Plan: Principal Problem:   AKI (acute kidney injury) (HCC) Active Problems:   HIV disease (HCC)   Polysubstance abuse (HCC)   Cigarette smoker   Alcohol use disorder, severe, dependence (HCC)   Metabolic acidosis with increased anion gap and accumulation of organic acids   History of seizures   Homelessness   Alcohol withdrawal (HCC)   Alcohol use disorder, severe, dependence (HCC) Alcohol withdrawal CIWA protocol MVT, Folic acid, Thiamine Advised to quit, SW consult Telemetry  AKI (acute kidney injury) (HCC) Resolved S/P IVF Daily BMP  Metabolic acidosis with increased anion gap Resolved Likely 2/2 heavy alcohol use Daily BMP  Hypomagnesemia Replace as needed  Hypokalemia Replace as needed  Diarrhea Likely 2/2 withdrawal, rule out any infective cause GI panel pending collection Monitor closely  Thrombocytopenia-chronic Likely 2/2 alcohol abuse Daily CBC  Mild  transaminitis Likely due to alcohol abuse  HIV disease (HCC) Patient's been out of his HIV meds for several days He does not follow-up with infectious disease on a regular basis ID consulted, appreciate recs  Homelessness SW consult   History of seizures Unclear if he actually has a true seizure disorder versus he had an alcohol withdrawal seizure. He has been without medications for several days now Continue Keppra  Tobacco abuse Patient declines nicotine patch Advised to quit   Polysubstance abuse (HCC) History of crystal amphetamine abuse UDS negative     Estimated body mass index is 20.34 kg/m as calculated from the following:   Height as of this encounter: 6' (1.829 m).   Weight as of this encounter: 68 kg.     Code Status: Full  Family Communication: None at bedside  Disposition Plan: Status is: Inpatient The patient will require care spanning > 2 midnights and should be moved to inpatient because: Level of care      Consultants: ID  Procedures: None  Antimicrobials: None  DVT prophylaxis: SCD due to thrombocytopenia   Objective: Vitals:   05/28/22 2042 05/29/22 0602 05/29/22 1111 05/29/22 1723  BP: 118/84 109/73 110/82 108/82  Pulse: 91 78 77 81  Resp: 16  18 18   Temp: 98.7 F (37.1 C) 98.8 F (37.1 C) 97.6 F (36.4 C) 98.8 F (37.1 C)  TempSrc:  Oral Oral Oral  SpO2: 99% 98% 99% 100%  Weight:      Height:        Intake/Output Summary (Last 24 hours) at 05/29/2022 1921 Last data filed at 05/29/2022  1800 Gross per 24 hour  Intake 2401.12 ml  Output 1800 ml  Net 601.12 ml   Filed Weights   05/27/22 2228  Weight: 68 kg    Exam: General: NAD, acutely ill-appearing, unkept Cardiovascular: S1, S2 present Respiratory: CTAB Abdomen: Soft, nontender, nondistended, bowel sounds present Musculoskeletal: No bilateral pedal edema noted Skin: Normal Psychiatry: Fair mood     Data Reviewed: CBC: Recent Labs  Lab 05/27/22 2151  05/27/22 2207 05/28/22 0457 05/29/22 0401  WBC 5.9  --  3.5* 1.8*  NEUTROABS 5.1  --  2.5 0.9*  HGB 14.3 15.0 13.1 12.2*  HCT 43.4 44.0 37.6* 35.6*  MCV 101.9*  --  97.7 96.7  PLT 107*  --  86* 67*   Basic Metabolic Panel: Recent Labs  Lab 05/27/22 2151 05/27/22 2207 05/28/22 0457 05/29/22 0401  NA 143 140 138 136  K 4.4 4.2 3.9 3.0*  CL 105 104 107 103  CO2 14*  --  20* 24  GLUCOSE 119* 122* 113* 107*  BUN 15 15 9  <5*  CREATININE 1.29* 1.20 0.74 0.68  CALCIUM 8.7*  --  7.7* 8.1*  MG  --   --  1.5* 1.7   GFR: Estimated Creatinine Clearance: 124 mL/min (by C-G formula based on SCr of 0.68 mg/dL). Liver Function Tests: Recent Labs  Lab 05/27/22 2151 05/28/22 0457  AST 115* 68*  ALT 51* 40  ALKPHOS 56 47  BILITOT 1.1 1.3*  PROT 7.0 5.8*  ALBUMIN 4.0 3.3*   Recent Labs  Lab 05/27/22 2151  LIPASE 103*   No results for input(s): "AMMONIA" in the last 168 hours. Coagulation Profile: No results for input(s): "INR", "PROTIME" in the last 168 hours. Cardiac Enzymes: No results for input(s): "CKTOTAL", "CKMB", "CKMBINDEX", "TROPONINI" in the last 168 hours. BNP (last 3 results) No results for input(s): "PROBNP" in the last 8760 hours. HbA1C: No results for input(s): "HGBA1C" in the last 72 hours. CBG: No results for input(s): "GLUCAP" in the last 168 hours. Lipid Profile: No results for input(s): "CHOL", "HDL", "LDLCALC", "TRIG", "CHOLHDL", "LDLDIRECT" in the last 72 hours. Thyroid Function Tests: No results for input(s): "TSH", "T4TOTAL", "FREET4", "T3FREE", "THYROIDAB" in the last 72 hours. Anemia Panel: No results for input(s): "VITAMINB12", "FOLATE", "FERRITIN", "TIBC", "IRON", "RETICCTPCT" in the last 72 hours. Urine analysis:    Component Value Date/Time   COLORURINE YELLOW 04/26/2022 0625   APPEARANCEUR HAZY (A) 04/26/2022 0625   APPEARANCEUR Clear 11/15/2014 1755   LABSPEC 1.019 04/26/2022 0625   LABSPEC 1.002 11/15/2014 1755   PHURINE 5.0  04/26/2022 0625   GLUCOSEU NEGATIVE 04/26/2022 0625   GLUCOSEU Negative 11/15/2014 1755   HGBUR SMALL (A) 04/26/2022 0625   BILIRUBINUR NEGATIVE 04/26/2022 0625   BILIRUBINUR Negative 11/15/2014 1755   KETONESUR 20 (A) 04/26/2022 0625   PROTEINUR 30 (A) 04/26/2022 0625   NITRITE NEGATIVE 04/26/2022 0625   LEUKOCYTESUR NEGATIVE 04/26/2022 0625   LEUKOCYTESUR Negative 11/15/2014 1755   Sepsis Labs: @LABRCNTIP (procalcitonin:4,lacticidven:4)  ) Recent Results (from the past 240 hour(s))  Resp Panel by RT-PCR (Flu A&B, Covid) Anterior Nasal Swab     Status: None   Collection Time: 05/20/22  8:19 PM   Specimen: Anterior Nasal Swab  Result Value Ref Range Status   SARS Coronavirus 2 by RT PCR NEGATIVE NEGATIVE Final    Comment: (NOTE) SARS-CoV-2 target nucleic acids are NOT DETECTED.  The SARS-CoV-2 RNA is generally detectable in upper respiratory specimens during the acute phase of infection. The lowest concentration of SARS-CoV-2  viral copies this assay can detect is 138 copies/mL. A negative result does not preclude SARS-Cov-2 infection and should not be used as the sole basis for treatment or other patient management decisions. A negative result may occur with  improper specimen collection/handling, submission of specimen other than nasopharyngeal swab, presence of viral mutation(s) within the areas targeted by this assay, and inadequate number of viral copies(<138 copies/mL). A negative result must be combined with clinical observations, patient history, and epidemiological information. The expected result is Negative.  Fact Sheet for Patients:  BloggerCourse.com  Fact Sheet for Healthcare Providers:  SeriousBroker.it  This test is no t yet approved or cleared by the [place] FDA and  has been authorized for detection and/or diagnosis of SARS-CoV-2 by FDA under an Emergency Use Authorization (EUA). This EUA will remain   in effect (meaning this test can be used) for the duration of the COVID-19 declaration under Section 564(b)(1) of the Act, 21 U.S.C.section 360bbb-3(b)(1), unless the authorization is terminated  or revoked sooner.       Influenza A by PCR NEGATIVE NEGATIVE Final   Influenza B by PCR NEGATIVE NEGATIVE Final    Comment: (NOTE) The Xpert Xpress SARS-CoV-2/FLU/RSV plus assay is intended as an aid in the diagnosis of influenza from Nasopharyngeal swab specimens and should not be used as a sole basis for treatment. Nasal washings and aspirates are unacceptable for Xpert Xpress SARS-CoV-2/FLU/RSV testing.  Fact Sheet for Patients: BloggerCourse.com  Fact Sheet for Healthcare Providers: SeriousBroker.it  This test is not yet approved or cleared by the [place] FDA and has been authorized for detection and/or diagnosis of SARS-CoV-2 by FDA under an Emergency Use Authorization (EUA). This EUA will remain in effect (meaning this test can be used) for the duration of the COVID-19 declaration under Section 564(b)(1) of the Act, 21 U.S.C. section 360bbb-3(b)(1), unless the authorization is terminated or revoked.  Performed at [facility], [address], [place], [place] [address]       Studies: US Abdomen Limited RUQ (LIVER/GB)  Result Date: 05/29/2022 CLINICAL DATA:  Abdominal pain since this morning EXAM: ULTRASOUND ABDOMEN LIMITED RIGHT UPPER QUADRANT COMPARISON:  None Available. FINDINGS: Gallbladder: No gallstones or wall thickening visualized. No sonographic Murphy sign noted by sonographer. Common bile duct: Diameter: 4.7 mm Liver: No focal lesion identified. Increased hepatic parenchymal echogenicity. Portal vein is patent on color Doppler imaging with normal direction of blood flow towards the liver. Other: None. IMPRESSION: 1. No cholelithiasis or sonographic evidence of acute cholecystitis. Electronically Signed    By: Elige Ko M.D.   On: 05/29/2022 10:08    Scheduled Meds:  bictegravir-emtricitabine-tenofovir AF  1 tablet Oral Daily   folic acid  1 mg Oral Daily   levETIRAcetam  500 mg Oral BID   multivitamin with minerals  1 tablet Oral Daily   pantoprazole  40 mg Oral BID   potassium chloride  40 mEq Oral BID   thiamine  100 mg Oral Daily   Or   thiamine  100 mg Intravenous Daily    Continuous Infusions:  dextrose 5% lactated ringers 100 mL/hr at 05/29/22 1706     LOS: 0 days     Briant Cedar, MD [facility]  If 7PM-7AM, please contact night-coverage [email or web address] 05/29/2022, 7:21 PM

## 2022-05-30 LAB — CBC WITH DIFFERENTIAL/PLATELET
Abs Immature Granulocytes: 0.01 10*3/uL (ref 0.00–0.07)
Basophils Absolute: 0 10*3/uL (ref 0.0–0.1)
Basophils Relative: 1 %
Eosinophils Absolute: 0.1 10*3/uL (ref 0.0–0.5)
Eosinophils Relative: 5 %
HCT: 35.6 % — ABNORMAL LOW (ref 39.0–52.0)
Hemoglobin: 12.1 g/dL — ABNORMAL LOW (ref 13.0–17.0)
Immature Granulocytes: 1 %
Lymphocytes Relative: 35 %
Lymphs Abs: 0.7 10*3/uL (ref 0.7–4.0)
MCH: 33.2 pg (ref 26.0–34.0)
MCHC: 34 g/dL (ref 30.0–36.0)
MCV: 97.8 fL (ref 80.0–100.0)
Monocytes Absolute: 0.2 10*3/uL (ref 0.1–1.0)
Monocytes Relative: 8 %
Neutro Abs: 1 10*3/uL — ABNORMAL LOW (ref 1.7–7.7)
Neutrophils Relative %: 50 %
Platelets: 74 10*3/uL — ABNORMAL LOW (ref 150–400)
RBC: 3.64 MIL/uL — ABNORMAL LOW (ref 4.22–5.81)
RDW: 13.2 % (ref 11.5–15.5)
WBC: 1.9 10*3/uL — ABNORMAL LOW (ref 4.0–10.5)
nRBC: 0 % (ref 0.0–0.2)

## 2022-05-30 LAB — BASIC METABOLIC PANEL
Anion gap: 7 (ref 5–15)
BUN: <5 mg/dL — ABNORMAL LOW (ref 6–20)
CO2: 22 mmol/L (ref 22–32)
Calcium: 8.5 mg/dL — ABNORMAL LOW (ref 8.9–10.3)
Chloride: 103 mmol/L (ref 98–111)
Creatinine, Ser: 0.65 mg/dL (ref 0.61–1.24)
GFR, Estimated: >60 mL/min (ref >60)
Glucose, Bld: 93 mg/dL (ref 70–99)
Potassium: 3.7 mmol/L (ref 3.5–5.1)
Sodium: 132 mmol/L — ABNORMAL LOW (ref 135–145)

## 2022-05-30 NOTE — Progress Notes (Signed)
PROGRESS NOTE  Gary [name] QQP:[id number] DOB: 05-Mar-1986 DOA: 05/27/2022 PCP: Pcp, No  HPI/Recap of past 24 hours: 36 yo male with hx of HIV, chronic homelessness, chronic alchol abuse, drug abuse, presents to ER with acute alcohol intoxication and possible withdrawal. Pt states he normally drinks 1/5 to 1/2 gallon of liquor. pt state he "lost" his HIV meds and seizure meds. work up in ER shows SCr of 1.29. baseline SCr 0.8.  bicarb of 14.  pt states he wants to stop alcohol use. was last in detox treatment about 1 year ago. sobriety only lasted 14 days. Pt does not see any healthcare providers on a regular basis, he gets all of his medication from hospital. Pt is entirely dependent on ER and hospital for his healthcare. In the ED, labs showed acute kidney injury.  Scr 1.29.  Alcohol level 177.  Serum bicarbonate 14.  Patient admitted for further management.    Patient denies any new complaints, insisting that he wants to take a shower of which nurses have noticed that he is pretty unstable on his feet.  Advised the risk of falling.  PT/OT consulted   Assessment/Plan: Principal Problem:   AKI (acute kidney injury) (HCC) Active Problems:   HIV disease (HCC)   Polysubstance abuse (HCC)   Cigarette smoker   Alcohol use disorder, severe, dependence (HCC)   Metabolic acidosis with increased anion gap and accumulation of organic acids   History of seizures   Homelessness   Alcohol withdrawal (HCC)   Alcohol use disorder, severe, dependence (HCC) Alcohol withdrawal CIWA protocol MVT, Folic acid, Thiamine Advised to quit, SW consult Telemetry  AKI (acute kidney injury) (HCC) Resolved S/P IVF Daily BMP  Metabolic acidosis with increased anion gap Resolved Likely 2/2 heavy alcohol use Daily BMP  Hypomagnesemia Replace as needed  Hypokalemia Replace as needed  Diarrhea Resolved Likely 2/2 withdrawal, rule out any infective cause Monitor  closely  Thrombocytopenia-chronic Likely 2/2 alcohol abuse Daily CBC  Mild transaminitis Likely due to alcohol abuse  HIV disease (HCC) Patient's been out of his HIV meds for several days PTA He does not follow-up with infectious disease on a regular basis ID consulted, appreciate recs  Homelessness SW consult   History of seizures Unclear if he actually has a true seizure disorder versus he had an alcohol withdrawal seizure. He has been without medications for several days now Continue Keppra  Tobacco abuse Patient declines nicotine patch Advised to quit   Polysubstance abuse (HCC) History of crystal amphetamine abuse UDS negative     Estimated body mass index is 20.34 kg/m as calculated from the following:   Height as of this encounter: 6' (1.829 m).   Weight as of this encounter: 68 kg.     Code Status: Full  Family Communication: None at bedside  Disposition Plan: Status is: Inpatient The patient will require care spanning > 2 midnights and should be moved to inpatient because: Level of care      Consultants: ID  Procedures: None  Antimicrobials: None  DVT prophylaxis: SCD due to thrombocytopenia   Objective: Vitals:   05/29/22 2224 05/30/22 0419 05/30/22 0921 05/30/22 1806  BP: 101/72 92/71 100/72 97/65  Pulse: 78 79 86 98  Resp: 18 16 18 16   Temp: 98.4 F (36.9 C) 98 F (36.7 C) 98.9 F (37.2 C) 98.1 F (36.7 C)  TempSrc: Oral  Oral Oral  SpO2: 99% 97% 99% 98%  Weight:      Height:  Intake/Output Summary (Last 24 hours) at 05/30/2022 1830 Last data filed at 05/30/2022 1402 Gross per 24 hour  Intake 467 ml  Output 675 ml  Net -208 ml   Filed Weights   05/27/22 2228  Weight: 68 kg    Exam: General: NAD Cardiovascular: S1, S2 present Respiratory: CTAB Abdomen: Soft, nontender, nondistended, bowel sounds present Musculoskeletal: No bilateral pedal edema noted Skin: Normal Psychiatry: Fair mood     Data  Reviewed: CBC: Recent Labs  Lab 05/27/22 2151 05/27/22 2207 05/28/22 0457 05/29/22 0401 05/30/22 0523  WBC 5.9  --  3.5* 1.8* 1.9*  NEUTROABS 5.1  --  2.5 0.9* 1.0*  HGB 14.3 15.0 13.1 12.2* 12.1*  HCT 43.4 44.0 37.6* 35.6* 35.6*  MCV 101.9*  --  97.7 96.7 97.8  PLT 107*  --  86* 67* 74*   Basic Metabolic Panel: Recent Labs  Lab 05/27/22 2151 05/27/22 2207 05/28/22 0457 05/29/22 0401 05/30/22 0523  NA 143 140 138 136 132*  K 4.4 4.2 3.9 3.0* 3.7  CL 105 104 107 103 103  CO2 14*  --  20* 24 22  GLUCOSE 119* 122* 113* 107* 93  BUN 15 15 9  <5* <5*  CREATININE 1.29* 1.20 0.74 0.68 0.65  CALCIUM 8.7*  --  7.7* 8.1* 8.5*  MG  --   --  1.5* 1.7  --    GFR: Estimated Creatinine Clearance: 124 mL/min (by C-G formula based on SCr of 0.65 mg/dL). Liver Function Tests: Recent Labs  Lab 05/27/22 2151 05/28/22 0457  AST 115* 68*  ALT 51* 40  ALKPHOS 56 47  BILITOT 1.1 1.3*  PROT 7.0 5.8*  ALBUMIN 4.0 3.3*   Recent Labs  Lab 05/27/22 2151  LIPASE 103*   No results for input(s): "AMMONIA" in the last 168 hours. Coagulation Profile: No results for input(s): "INR", "PROTIME" in the last 168 hours. Cardiac Enzymes: No results for input(s): "CKTOTAL", "CKMB", "CKMBINDEX", "TROPONINI" in the last 168 hours. BNP (last 3 results) No results for input(s): "PROBNP" in the last 8760 hours. HbA1C: No results for input(s): "HGBA1C" in the last 72 hours. CBG: No results for input(s): "GLUCAP" in the last 168 hours. Lipid Profile: No results for input(s): "CHOL", "HDL", "LDLCALC", "TRIG", "CHOLHDL", "LDLDIRECT" in the last 72 hours. Thyroid Function Tests: No results for input(s): "TSH", "T4TOTAL", "FREET4", "T3FREE", "THYROIDAB" in the last 72 hours. Anemia Panel: No results for input(s): "VITAMINB12", "FOLATE", "FERRITIN", "TIBC", "IRON", "RETICCTPCT" in the last 72 hours. Urine analysis:    Component Value Date/Time   COLORURINE YELLOW 04/26/2022 0625   APPEARANCEUR  HAZY (A) 04/26/2022 0625   APPEARANCEUR Clear 11/15/2014 1755   LABSPEC 1.019 04/26/2022 0625   LABSPEC 1.002 11/15/2014 1755   PHURINE 5.0 04/26/2022 0625   GLUCOSEU NEGATIVE 04/26/2022 0625   GLUCOSEU Negative 11/15/2014 1755   HGBUR SMALL (A) 04/26/2022 0625   BILIRUBINUR NEGATIVE 04/26/2022 0625   BILIRUBINUR Negative 11/15/2014 1755   KETONESUR 20 (A) 04/26/2022 0625   PROTEINUR 30 (A) 04/26/2022 0625   NITRITE NEGATIVE 04/26/2022 0625   LEUKOCYTESUR NEGATIVE 04/26/2022 0625   LEUKOCYTESUR Negative 11/15/2014 1755   Sepsis Labs: @LABRCNTIP (procalcitonin:4,lacticidven:4)  ) Recent Results (from the past 240 hour(s))  Resp Panel by RT-PCR (Flu A&B, Covid) Anterior Nasal Swab     Status: None   Collection Time: 05/20/22  8:19 PM   Specimen: Anterior Nasal Swab  Result Value Ref Range Status   SARS Coronavirus 2 by RT PCR NEGATIVE NEGATIVE Final  Comment: (NOTE) SARS-CoV-2 target nucleic acids are NOT DETECTED.  The SARS-CoV-2 RNA is generally detectable in upper respiratory specimens during the acute phase of infection. The lowest concentration of SARS-CoV-2 viral copies this assay can detect is 138 copies/mL. A negative result does not preclude SARS-Cov-2 infection and should not be used as the sole basis for treatment or other patient management decisions. A negative result may occur with  improper specimen collection/handling, submission of specimen other than nasopharyngeal swab, presence of viral mutation(s) within the areas targeted by this assay, and inadequate number of viral copies(<138 copies/mL). A negative result must be combined with clinical observations, patient history, and epidemiological information. The expected result is Negative.  Fact Sheet for Patients:  BloggerCourse.com  Fact Sheet for Healthcare Providers:  SeriousBroker.it  This test is no t yet approved or cleared by the [place] FDA  and  has been authorized for detection and/or diagnosis of SARS-CoV-2 by FDA under an Emergency Use Authorization (EUA). This EUA will remain  in effect (meaning this test can be used) for the duration of the COVID-19 declaration under Section 564(b)(1) of the Act, 21 U.S.C.section 360bbb-3(b)(1), unless the authorization is terminated  or revoked sooner.       Influenza A by PCR NEGATIVE NEGATIVE Final   Influenza B by PCR NEGATIVE NEGATIVE Final    Comment: (NOTE) The Xpert Xpress SARS-CoV-2/FLU/RSV plus assay is intended as an aid in the diagnosis of influenza from Nasopharyngeal swab specimens and should not be used as a sole basis for treatment. Nasal washings and aspirates are unacceptable for Xpert Xpress SARS-CoV-2/FLU/RSV testing.  Fact Sheet for Patients: BloggerCourse.com  Fact Sheet for Healthcare Providers: SeriousBroker.it  This test is not yet approved or cleared by the [place] FDA and has been authorized for detection and/or diagnosis of SARS-CoV-2 by FDA under an Emergency Use Authorization (EUA). This EUA will remain in effect (meaning this test can be used) for the duration of the COVID-19 declaration under Section 564(b)(1) of the Act, 21 U.S.C. section 360bbb-3(b)(1), unless the authorization is terminated or revoked.  Performed at [facility], [address], [place], [place] [address]       Studies: No results found.  Scheduled Meds:  bictegravir-emtricitabine-tenofovir AF  1 tablet Oral Daily   folic acid  1 mg Oral Daily   levETIRAcetam  500 mg Oral BID   multivitamin with minerals  1 tablet Oral Daily   pantoprazole  40 mg Oral BID   thiamine  100 mg Oral Daily   Or   thiamine  100 mg Intravenous Daily    Continuous Infusions:     LOS: 1 day     Briant Cedar, MD [facility]  If 7PM-7AM, please contact night-coverage [email or web address] 05/30/2022, 6:30 PM

## 2022-05-31 LAB — BASIC METABOLIC PANEL
Anion gap: 7 (ref 5–15)
BUN: 6 mg/dL (ref 6–20)
CO2: 24 mmol/L (ref 22–32)
Calcium: 9 mg/dL (ref 8.9–10.3)
Chloride: 105 mmol/L (ref 98–111)
Creatinine, Ser: 0.65 mg/dL (ref 0.61–1.24)
GFR, Estimated: >60 mL/min (ref >60)
Glucose, Bld: 135 mg/dL — ABNORMAL HIGH (ref 70–99)
Potassium: 3.5 mmol/L (ref 3.5–5.1)
Sodium: 136 mmol/L (ref 135–145)

## 2022-05-31 LAB — CBC WITH DIFFERENTIAL/PLATELET
Abs Immature Granulocytes: 0 10*3/uL (ref 0.00–0.07)
Basophils Absolute: 0 10*3/uL (ref 0.0–0.1)
Basophils Relative: 0 %
Eosinophils Absolute: 0.1 10*3/uL (ref 0.0–0.5)
Eosinophils Relative: 2 %
HCT: 36.5 % — ABNORMAL LOW (ref 39.0–52.0)
Hemoglobin: 12.7 g/dL — ABNORMAL LOW (ref 13.0–17.0)
Immature Granulocytes: 0 %
Lymphocytes Relative: 35 %
Lymphs Abs: 1 10*3/uL (ref 0.7–4.0)
MCH: 33.6 pg (ref 26.0–34.0)
MCHC: 34.8 g/dL (ref 30.0–36.0)
MCV: 96.6 fL (ref 80.0–100.0)
Monocytes Absolute: 0.2 10*3/uL (ref 0.1–1.0)
Monocytes Relative: 7 %
Neutro Abs: 1.6 10*3/uL — ABNORMAL LOW (ref 1.7–7.7)
Neutrophils Relative %: 56 %
Platelets: 93 10*3/uL — ABNORMAL LOW (ref 150–400)
RBC: 3.78 MIL/uL — ABNORMAL LOW (ref 4.22–5.81)
RDW: 13.2 % (ref 11.5–15.5)
WBC: 2.8 10*3/uL — ABNORMAL LOW (ref 4.0–10.5)
nRBC: 0 % (ref 0.0–0.2)

## 2022-05-31 MED ORDER — NICOTINE 21 MG/24HR TD PT24: 21.0000 mg | MEDICATED_PATCH | Freq: Every day | TRANSDERMAL | Status: DC

## 2022-05-31 MED ORDER — LORAZEPAM 1 MG PO TABS: 1.0000 mg | ORAL_TABLET | ORAL | Status: AC | PRN

## 2022-05-31 MED ORDER — LORAZEPAM 2 MG/ML IJ SOLN
1.0000 mg | INTRAMUSCULAR | Status: AC | PRN
  Administered 2022-05-30: 3 mg via INTRAVENOUS
  Administered 2022-05-31 (×2): 2 mg via INTRAVENOUS
  Administered 2022-05-31 (×2): 3 mg via INTRAVENOUS

## 2022-05-31 MED ORDER — LORAZEPAM 2 MG/ML IJ SOLN
4.0000 mg | Freq: Once | INTRAMUSCULAR | Status: AC
  Administered 2022-05-31: 4 mg via INTRAVENOUS

## 2022-05-31 NOTE — Evaluation (Signed)
Occupational Therapy Evaluation Patient Details Name: Gary [name] MRN: [id number] DOB: 01-23-1986 Today's Date: 05/31/2022   History of Present Illness 36 y/o admitted with acute ETOH intoxication and possible withdrawal.  Dx: ETOH use disorder, severe dependence, metablic acidosis, AKI, polysubstance abuse.  PMH includes:  seizure disorder, HIV, polysubstance abuse, schizophrenis, PTSD, SDH, alcohol induced pancreatitis, bipolar disorder   Clinical Impression   Pt admitted with above. He demonstrates the below listed deficits and will benefit from continued OT to maximize safety and independence with BADLs.  Pt presents to OT with generalized weakness, decreased activity tolerance, impaired balance, and decreased cognition.  He currently requires min guard assist for ADLs.  Anticipate he will quickly progress to his baseline.  Will follow acutely.       Recommendations for follow up therapy are one component of a multi-disciplinary discharge planning process, led by the attending physician.  Recommendations may be updated based on patient status, additional functional criteria and insurance authorization.   Follow Up Recommendations  No OT follow up    Assistance Recommended at Discharge None  Patient can return home with the following Assist for transportation    Functional Status Assessment  Patient has had a recent decline in their functional status and demonstrates the ability to make significant improvements in function in a reasonable and predictable amount of time.  Equipment Recommendations  None recommended by OT    Recommendations for Other Services       Precautions / Restrictions Precautions Precautions: Fall Precaution Comments: pt endorses h/o falls, but does not elaborate      Mobility Bed Mobility Overal bed mobility: Modified Independent                  Transfers                          Balance                                            ADL either performed or assessed with clinical judgement   ADL Overall ADL's : Needs assistance/impaired Eating/Feeding: Independent   Grooming: Wash/dry hands;Wash/dry face;Brushing hair;Supervision/safety;Standing   Upper Body Bathing: Set up;Supervision/ safety;Sitting   Lower Body Bathing: Min guard;Sit to/from stand   Upper Body Dressing : Set up;Supervision/safety;Sitting   Lower Body Dressing: Min guard;Sit to/from stand   Toilet Transfer: Min guard;Ambulation;Comfort height toilet   Toileting- Clothing Manipulation and Hygiene: Min guard;Sit to/from stand       Functional mobility during ADLs: Min guard General ADL Comments: pt mildly unsteady     Vision         Perception     Praxis      Pertinent Vitals/Pain Pain Assessment Pain Assessment: No/denies pain     Hand Dominance Right   Extremity/Trunk Assessment Upper Extremity Assessment Upper Extremity Assessment: Generalized weakness (tremulous)   Lower Extremity Assessment Lower Extremity Assessment: Generalized weakness   Cervical / Trunk Assessment Cervical / Trunk Assessment: Normal   Communication Communication Communication: Expressive difficulties (low volume - difficult to understand)   Cognition Arousal/Alertness: Awake/alert Behavior During Therapy: Flat affect, Impulsive Overall Cognitive Status: No family/caregiver present to determine baseline cognitive functioning  General Comments: Pt oriented to time, place, and situation, but confused as to details of hospitalization.  He demonstrates impaired safety awareness.   He is a bit evasive with questioning     General Comments  pt reports he is eager to leave hospital so he can "make money to buy some Xanax"    Exercises     Shoulder Instructions      Home Living Family/patient expects to be discharged to:: Shelter/Homeless                                  Additional Comments: Pt reports he is homeless, but will not provide further info re: sleeping situation, or resources he uses      Prior Functioning/Environment Prior Level of Function : Independent/Modified Independent             Mobility Comments: pt endorses falls ADLs Comments: Pt reports independent with ADLs.  panhandles to make money        OT Problem List: Decreased strength;Impaired balance (sitting and/or standing);Decreased activity tolerance;Decreased safety awareness;Decreased cognition      OT Treatment/Interventions: Self-care/ADL training;Therapeutic exercise;Therapeutic activities;Patient/family education;Balance training;Cognitive remediation/compensation    OT Goals(Current goals can be found in the care plan section) Acute Rehab OT Goals Patient Stated Goal: to leave hospital OT Goal Formulation: With patient Time For Goal Achievement: 06/14/22 Potential to Achieve Goals: Good ADL Goals Pt Will Perform Grooming: Independently;standing Pt Will Perform Upper Body Bathing: Independently;sitting;standing Pt Will Perform Lower Body Bathing: Independently;sit to/from stand Pt Will Perform Upper Body Dressing: Independently;sitting;standing Pt Will Perform Lower Body Dressing: Independently;sit to/from stand Pt Will Transfer to Toilet: Independently;ambulating;regular height toilet Pt Will Perform Toileting - Clothing Manipulation and hygiene: Independently;sit to/from stand  OT Frequency: Min 2X/week    Co-evaluation              AM-PAC OT "6 Clicks" Daily Activity     Outcome Measure Help from another person eating meals?: None Help from another person taking care of personal grooming?: A Little Help from another person toileting, which includes using toliet, bedpan, or urinal?: A Little Help from another person bathing (including washing, rinsing, drying)?: A Little Help from another person to put on and taking off regular upper body  clothing?: A Little Help from another person to put on and taking off regular lower body clothing?: A Little 6 Click Score: 19   End of Session Nurse Communication: Mobility status  Activity Tolerance: Patient tolerated treatment well Patient left: in bed;with call bell/phone within reach;with bed alarm set  OT Visit Diagnosis: Unsteadiness on feet (R26.81);Cognitive communication deficit (R41.841)                Time: 1610-9604 OT Time Calculation (min): 18 min Charges:  OT General Charges $OT Visit: 1 Visit OT Evaluation $OT Eval Moderate Complexity: 1 Mod OT Treatments $Self Care/Home Management : 8-22 mins  Eber Jones., OTR/L Acute Rehabilitation Services Pager [phone] Office [phone]   Boykin Reaper 05/31/2022, 4:21 PM

## 2022-05-31 NOTE — Progress Notes (Signed)
PT Cancellation Note  Patient Details Name: Gary [name] MRN: [id number] DOB: [birth year]   Cancelled Treatment:    Reason Eval/Treat Not Completed: Other (comment). RN requesting to hold PT this AM as pt had a rough night. He was given 4mg  Ativan about 0530 and is sound asleep. PT will continue to f/u with pt acutely as available and appropriate.    Yashua Bracco 05/31/2022, 8:38 AM

## 2022-05-31 NOTE — Progress Notes (Signed)
PROGRESS NOTE  Gary [name] JYN:[id number] DOB: [birth year] DOA: 05/27/2022 PCP: Pcp, No  HPI/Recap of past 24 hours: 36 yo male with hx of HIV, chronic homelessness, chronic alchol abuse, drug abuse, presents to ER with acute alcohol intoxication and possible withdrawal. Pt states he normally drinks 1/5 to 1/2 gallon of liquor. pt state he "lost" his HIV meds and seizure meds. work up in ER shows SCr of 1.29. baseline SCr 0.8.  bicarb of 14.  pt states he wants to stop alcohol use. was last in detox treatment about 1 year ago. sobriety only lasted 14 days. Pt does not see any healthcare providers on a regular basis, he gets all of his medication from hospital. Pt is entirely dependent on ER and hospital for his healthcare. In the ED, labs showed acute kidney injury.  Scr 1.29.  Alcohol level 177.  Serum bicarbonate 14.  Patient admitted for further management.     Overnight, patient was noted to be agitated/confused, insisting on going out to smoke.  Received about 4 mg of IV Ativan to calm him down.  This a.m., noted to be sleepy.   Assessment/Plan: Principal Problem:   AKI (acute kidney injury) (HCC) Active Problems:   HIV disease (HCC)   Polysubstance abuse (HCC)   Cigarette smoker   Alcohol use disorder, severe, dependence (HCC)   Metabolic acidosis with increased anion gap and accumulation of organic acids   History of seizures   Homelessness   Alcohol withdrawal (HCC)   Alcohol use disorder, severe, dependence (HCC) Alcohol withdrawal CIWA protocol MVT, Folic acid, Thiamine Advised to quit, SW consult Telemetry  AKI (acute kidney injury) (HCC) Resolved S/P IVF Daily BMP  Metabolic acidosis with increased anion gap Resolved Likely 2/2 heavy alcohol use Daily BMP  Hypomagnesemia Replace as needed  Hypokalemia Replace as needed  Diarrhea Resolved Likely 2/2 withdrawal, rule out any infective cause Monitor closely  Thrombocytopenia-chronic Likely 2/2  alcohol abuse Daily CBC  Mild transaminitis Likely due to alcohol abuse  HIV disease (HCC) Patient's been out of his HIV meds for several days PTA He does not follow-up with infectious disease on a regular basis ID consulted, appreciate recs  Homelessness SW consult   History of seizures Unclear if he actually has a true seizure disorder versus he had an alcohol withdrawal seizure. He has been without medications for several days now Continue Keppra  Tobacco abuse Patient declines nicotine patch Advised to quit   Polysubstance abuse (HCC) History of crystal amphetamine abuse UDS negative     Estimated body mass index is 20.34 kg/m as calculated from the following:   Height as of this encounter: 6' (1.829 m).   Weight as of this encounter: 68 kg.     Code Status: Full  Family Communication: None at bedside  Disposition Plan: Status is: Inpatient The patient will require care spanning > 2 midnights and should be moved to inpatient because: Level of care      Consultants: ID  Procedures: None  Antimicrobials: None  DVT prophylaxis: SCD due to thrombocytopenia   Objective: Vitals:   05/30/22 0921 05/30/22 1806 05/31/22 0458 05/31/22 1001  BP: 100/72 97/65 106/83 105/81  Pulse: 86 98 73 75  Resp: 18 16 19 16   Temp: 98.9 F (37.2 C) 98.1 F (36.7 C) 98.1 F (36.7 C) 97.9 F (36.6 C)  TempSrc: Oral Oral Oral Oral  SpO2: 99% 98% 100% 100%  Weight:      Height:  Intake/Output Summary (Last 24 hours) at 05/31/2022 1455 Last data filed at 05/31/2022 0830 Gross per 24 hour  Intake 437 ml  Output 550 ml  Net -113 ml   Filed Weights   05/27/22 2228  Weight: 68 kg    Exam: General: NAD, sleepy Cardiovascular: S1, S2 present Respiratory: CTAB Abdomen: Soft, nontender, nondistended, bowel sounds present Musculoskeletal: No bilateral pedal edema noted Skin: Normal Psychiatry: Fair mood     Data Reviewed: CBC: Recent Labs  Lab  05/27/22 2151 05/27/22 2207 05/28/22 0457 05/29/22 0401 05/30/22 0523 05/31/22 0430  WBC 5.9  --  3.5* 1.8* 1.9* 2.8*  NEUTROABS 5.1  --  2.5 0.9* 1.0* 1.6*  HGB 14.3 15.0 13.1 12.2* 12.1* 12.7*  HCT 43.4 44.0 37.6* 35.6* 35.6* 36.5*  MCV 101.9*  --  97.7 96.7 97.8 96.6  PLT 107*  --  86* 67* 74* 93*   Basic Metabolic Panel: Recent Labs  Lab 05/27/22 2151 05/27/22 2207 05/28/22 0457 05/29/22 0401 05/30/22 0523 05/31/22 0430  NA 143 140 138 136 132* 136  K 4.4 4.2 3.9 3.0* 3.7 3.5  CL 105 104 107 103 103 105  CO2 14*  --  20* 24 22 24   GLUCOSE 119* 122* 113* 107* 93 135*  BUN 15 15 9  <5* <5* 6  CREATININE 1.29* 1.20 0.74 0.68 0.65 0.65  CALCIUM 8.7*  --  7.7* 8.1* 8.5* 9.0  MG  --   --  1.5* 1.7  --   --    GFR: Estimated Creatinine Clearance: 124 mL/min (by C-G formula based on SCr of 0.65 mg/dL). Liver Function Tests: Recent Labs  Lab 05/27/22 2151 05/28/22 0457  AST 115* 68*  ALT 51* 40  ALKPHOS 56 47  BILITOT 1.1 1.3*  PROT 7.0 5.8*  ALBUMIN 4.0 3.3*   Recent Labs  Lab 05/27/22 2151  LIPASE 103*   No results for input(s): "AMMONIA" in the last 168 hours. Coagulation Profile: No results for input(s): "INR", "PROTIME" in the last 168 hours. Cardiac Enzymes: No results for input(s): "CKTOTAL", "CKMB", "CKMBINDEX", "TROPONINI" in the last 168 hours. BNP (last 3 results) No results for input(s): "PROBNP" in the last 8760 hours. HbA1C: No results for input(s): "HGBA1C" in the last 72 hours. CBG: No results for input(s): "GLUCAP" in the last 168 hours. Lipid Profile: No results for input(s): "CHOL", "HDL", "LDLCALC", "TRIG", "CHOLHDL", "LDLDIRECT" in the last 72 hours. Thyroid Function Tests: No results for input(s): "TSH", "T4TOTAL", "FREET4", "T3FREE", "THYROIDAB" in the last 72 hours. Anemia Panel: No results for input(s): "VITAMINB12", "FOLATE", "FERRITIN", "TIBC", "IRON", "RETICCTPCT" in the last 72 hours. Urine analysis:    Component Value  Date/Time   COLORURINE YELLOW 04/26/2022 0625   APPEARANCEUR HAZY (A) 04/26/2022 0625   APPEARANCEUR Clear 11/15/2014 1755   LABSPEC 1.019 04/26/2022 0625   LABSPEC 1.002 11/15/2014 1755   PHURINE 5.0 04/26/2022 0625   GLUCOSEU NEGATIVE 04/26/2022 0625   GLUCOSEU Negative 11/15/2014 1755   HGBUR SMALL (A) 04/26/2022 0625   BILIRUBINUR NEGATIVE 04/26/2022 0625   BILIRUBINUR Negative 11/15/2014 1755   KETONESUR 20 (A) 04/26/2022 0625   PROTEINUR 30 (A) 04/26/2022 0625   NITRITE NEGATIVE 04/26/2022 0625   LEUKOCYTESUR NEGATIVE 04/26/2022 0625   LEUKOCYTESUR Negative 11/15/2014 1755   Sepsis Labs: @LABRCNTIP (procalcitonin:4,lacticidven:4)  ) No results found for this or any previous visit (from the past 240 hour(s)).     Studies: No results found.  Scheduled Meds:  bictegravir-emtricitabine-tenofovir AF  1 tablet Oral Daily   folic  acid  1 mg Oral Daily   levETIRAcetam  500 mg Oral BID   multivitamin with minerals  1 tablet Oral Daily   nicotine  21 mg Transdermal Daily   pantoprazole  40 mg Oral BID   thiamine  100 mg Oral Daily   Or   thiamine  100 mg Intravenous Daily    Continuous Infusions:     LOS: 2 days     Briant Cedar, MD [facility]  If 7PM-7AM, please contact night-coverage [email or web address] 05/31/2022, 2:55 PM

## 2022-05-31 NOTE — Progress Notes (Signed)
OT Cancellation Note  Patient Details Name: Gary [name] MRN: [id number] DOB: [birth year]   Cancelled Treatment:    Reason Eval/Treat Not Completed: Fatigue/lethargy limiting ability to participate.  Spoke with RN, who reports pt received Ativan this am and is sleeping soundly.  Will reattempt as schedule permits.   Eber Jones., OTR/L Acute Rehabilitation Services Pager [phone] Office [phone]   Boykin Reaper 05/31/2022, 8:52 AM

## 2022-06-01 ENCOUNTER — Other Ambulatory Visit (HOSPITAL_COMMUNITY): Payer: Self-pay

## 2022-06-01 MED ORDER — LEVETIRACETAM 500 MG PO TABS
500.0000 mg | ORAL_TABLET | Freq: Two times a day (BID) | ORAL | 3 refills | Status: DC
  Filled 2022-06-01 (×2): qty 60, 30d supply, fill #0

## 2022-06-01 NOTE — Progress Notes (Signed)
Occupational Therapy Treatment Patient Details Name: Gary [name] MRN: [id number] DOB: 1986-09-21 Today's Date: 06/01/2022   History of present illness 36 y/o admitted with acute ETOH intoxication and possible withdrawal.  Dx: ETOH use disorder, severe dependence, metablic acidosis, AKI, polysubstance abuse.  PMH includes:  seizure disorder, HIV, polysubstance abuse, schizophrenis, PTSD, SDH, alcohol induced pancreatitis, bipolar disorder   OT comments  Pt greeted seated EOB eating breakfast, pt agreeable to OT intervention. Pt continues to present with mild balance impairments needing min guard assist for functional mobility greater than a household distance but no overt LOB. Pt ambulated ~ 20 ft wit no AD needing supervision - minguard assist. Pt eager to DC out of the hospital reporting that he sleeps on the streets and gets food from local resources, did issue pt list of community resources with both food/shelter options as well as back pack to transport items, pt appreciative. Pt with poor insight into medical status asking for bus pass to liquor store. Will continue to follow acutely per POC.     Recommendations for follow up therapy are one component of a multi-disciplinary discharge planning process, led by the attending physician.  Recommendations may be updated based on patient status, additional functional criteria and insurance authorization.    Follow Up Recommendations  No OT follow up    Assistance Recommended at Discharge None  Patient can return home with the following  Assist for transportation   Equipment Recommendations  None recommended by OT    Recommendations for Other Services      Precautions / Restrictions Precautions Precautions: Fall Restrictions Weight Bearing Restrictions: No       Mobility Bed Mobility               General bed mobility comments: sitting EOB    Transfers Overall transfer level: Needs assistance   Transfers: Sit to/from  Stand Sit to Stand: Supervision, Min guard           General transfer comment: supervision for safety, min guard upon initial stand d/t unsteadiness     Balance Overall balance assessment: Mild deficits observed, not formally tested                                         ADL either performed or assessed with clinical judgement   ADL                           Toilet Transfer: Min guard;Ambulation           Functional mobility during ADLs: Min guard General ADL Comments: mildy unsteady with functional mobility in hallway and in room, pt aware that hes unsteady, attributing unsteadiness to heavy boots    Extremity/Trunk Assessment Upper Extremity Assessment Upper Extremity Assessment: Overall WFL for tasks assessed   Lower Extremity Assessment Lower Extremity Assessment: Defer to PT evaluation   Cervical / Trunk Assessment Cervical / Trunk Assessment: Normal    Vision Baseline Vision/History: 0 No visual deficits     Perception Perception Perception: Within Functional Limits   Praxis Praxis Praxis: Intact    Cognition Arousal/Alertness: Awake/alert Behavior During Therapy: Flat affect, Impulsive Overall Cognitive Status: No family/caregiver present to determine baseline cognitive functioning  General Comments: pt seems to have decreased insight into deficits as pt reports he needs a bus pass to get a ride to the liquor store, however likely baseline level of cog. pt answering all of OTAs questions appropriately reporting that he gets his meals from shelters and sleeps on the ground. appropriately asks for book bag to carry belongings, asking for IV ativan and stating that if he doesn't get it he will get benzos from the street or will start drinking again        Exercises      Shoulder Instructions       General Comments issued pt backpack and resourses for shelters and options for  food    Pertinent Vitals/ Pain       Pain Assessment Pain Assessment: No/denies pain  Home Living                                          Prior Functioning/Environment              Frequency  Min 2X/week        Progress Toward Goals  OT Goals(current goals can now be found in the care plan section)  Progress towards OT goals: Progressing toward goals  Acute Rehab OT Goals Patient Stated Goal: to leave hospital OT Goal Formulation: With patient Time For Goal Achievement: 06/14/22 Potential to Achieve Goals: Good  Plan Discharge plan remains appropriate;Frequency remains appropriate    Co-evaluation                 AM-PAC OT "6 Clicks" Daily Activity     Outcome Measure   Help from another person eating meals?: Total Help from another person taking care of personal grooming?: None Help from another person toileting, which includes using toliet, bedpan, or urinal?: None Help from another person bathing (including washing, rinsing, drying)?: None Help from another person to put on and taking off regular upper body clothing?: None Help from another person to put on and taking off regular lower body clothing?: None 6 Click Score: 21    End of Session    OT Visit Diagnosis: Unsteadiness on feet (R26.81);Cognitive communication deficit (R41.841)   Activity Tolerance Patient tolerated treatment well   Patient Left in bed;with call bell/phone within reach   Nurse Communication Mobility status;Other (comment) (nows wants IVS out)        Time: 9379-0240 OT Time Calculation (min): 27 min  Charges: OT General Charges $OT Visit: 1 Visit OT Treatments $Self Care/Home Management : 23-37 mins Lenor Derrick., COTA/L Acute Rehabilitation Services [phone]   Barron Schmid 06/01/2022, 12:30 PM

## 2022-06-01 NOTE — Discharge Summary (Signed)
Physician Discharge Summary   Patient: Gary [name] MRN: [id number] DOB: 14-Jan-1986  Admit date:     05/27/2022  Discharge date: 06/01/22  Discharge Physician: Briant Cedar   PCP: Pcp, No   Recommendations at discharge:   Follow-up with PCP at [facility] Follow-up with ID as scheduled   Discharge Diagnoses: Principal Problem:   AKI (acute kidney injury) (HCC) Active Problems:   HIV disease (HCC)   Polysubstance abuse (HCC)   Cigarette smoker   Alcohol use disorder, severe, dependence (HCC)   Metabolic acidosis with increased anion gap and accumulation of organic acids   History of seizures   Homelessness   Alcohol withdrawal [facility])   Hospital Course: 36 yo male with hx of HIV, chronic homelessness, chronic alchol abuse, drug abuse, presents to ER with acute alcohol intoxication and possible withdrawal. Pt states he normally drinks 1/5 to 1/2 gallon of liquor. pt state he "lost" his HIV meds and seizure meds. work up in ER shows SCr of 1.29. baseline SCr 0.8.  bicarb of 14.  pt states he wants to stop alcohol use. was last in detox treatment about 1 year ago. sobriety only lasted 14 days. Pt does not see any healthcare providers on a regular basis, he gets all of his medication from hospital. Pt is entirely dependent on ER and hospital for his healthcare. In the ED, labs showed acute kidney injury.  Scr 1.29.  Alcohol level 177.  Serum bicarbonate 14.  Patient admitted for further management.     Today, patient denies any new complaints.  Stable to discharge.  Worker provided resources for patient. Biktarvy medication provided at bedside.    Assessment and Plan:  Alcohol use disorder, severe, dependence (HCC) Alcohol withdrawal S/P CIWA protocol MVT, Folic acid, Thiamine Advised to quit, SW consult   AKI (acute kidney injury) (HCC) Resolved S/P IVF   Metabolic acidosis with increased anion gap Resolved Likely 2/2 heavy alcohol use    Hypomagnesemia Replace as needed   Hypokalemia Replace as needed   Diarrhea Resolved Likely 2/2 withdrawal, rule out any infective cause   Thrombocytopenia-chronic Likely 2/2 alcohol abuse   Mild transaminitis Likely due to alcohol abuse   HIV disease (HCC) Patient's been out of his HIV meds for several days PTA He does not follow-up with infectious disease on a regular basis Biktarvy provided upon discharge Follow-up with ID as outpatient   Homelessness SW consult   History of seizures Unclear if he actually has a true seizure disorder versus he had an alcohol withdrawal seizure. He has been without medications for several days now Continue Keppra   Tobacco abuse Patient declines nicotine patch Advised to quit   Polysubstance abuse (HCC) History of crystal amphetamine abuse UDS negative       Pain control - [place] Controlled Substance Reporting System database was reviewed. and patient was instructed, not to drive, operate heavy machinery, perform activities at heights, swimming or participation in water activities or provide baby-sitting services while on Pain, Sleep and Anxiety Medications; until their outpatient Physician has advised to do so again. Also recommended to not to take more than prescribed Pain, Sleep and Anxiety Medications.   Consultants: None Procedures performed: None Disposition: Homeless, resources provided Diet recommendation:  Regular diet    DISCHARGE MEDICATION: Allergies as of 06/01/2022       Reactions   Tegretol [carbamazepine] Other (See Comments)   Caused vertigo for 2 days after taking it   Caffeine Palpitations  Medication List     STOP taking these medications    chlordiazePOXIDE 25 MG capsule Commonly known as: LIBRIUM   LORazepam 0.5 MG tablet Commonly known as: ATIVAN   ondansetron 4 MG tablet Commonly known as: Zofran   polyethylene glycol powder 17 GM/SCOOP powder Commonly known as:  GLYCOLAX/MIRALAX   Senexon-S 8.6-50 MG tablet Generic drug: senna-docusate       TAKE these medications    Biktarvy 50-200-25 MG Tabs tablet Generic drug: bictegravir-emtricitabine-tenofovir AF Take 1 tablet by mouth daily.   CertaVite/Antioxidants Tabs Take 1 tablet by mouth daily.   folic acid 1 MG tablet Commonly known as: FOLVITE Take 1 tablet (1 mg total) by mouth daily.   gabapentin 300 MG capsule Commonly known as: NEURONTIN Take 1 capsule (300 mg total) by mouth 3 (three) times daily. What changed:  how much to take when to take this reasons to take this   levETIRAcetam 500 MG tablet Commonly known as: KEPPRA Take 1 tablet (500 mg total) by mouth 2 (two) times daily. What changed: when to take this   pantoprazole 40 MG tablet Commonly known as: PROTONIX Take 1 tablet (40 mg total) by mouth 2 (two) times daily.   thiamine 100 MG tablet Take 1 tablet (100 mg total) by mouth daily. What changed: when to take this        Follow-up Information     Vu, Tonita Phoenix, MD. Schedule an appointment as soon as possible for a visit on 07/08/2022.   Specialty: Infectious Diseases Why: follow up with infectious disease clinic at 8/30 at 10 am Contact information: [address] [place] [address] [phone]                Discharge Exam: Ceasar Mons Weights   05/27/22 2228  Weight: 68 kg   General: NAD  Cardiovascular: S1, S2 present Respiratory: CTAB Abdomen: Soft, nontender, nondistended, bowel sounds present Musculoskeletal: No bilateral pedal edema noted Skin: Normal Psychiatry: Normal mood   Condition at discharge: stable  The results of significant diagnostics from this hospitalization (including imaging, microbiology, ancillary and laboratory) are listed below for reference.   Imaging Studies: US Abdomen Limited RUQ (LIVER/GB)  Result Date: 05/29/2022 CLINICAL DATA:  Abdominal pain since this morning EXAM: ULTRASOUND ABDOMEN  LIMITED RIGHT UPPER QUADRANT COMPARISON:  None Available. FINDINGS: Gallbladder: No gallstones or wall thickening visualized. No sonographic Murphy sign noted by sonographer. Common bile duct: Diameter: 4.7 mm Liver: No focal lesion identified. Increased hepatic parenchymal echogenicity. Portal vein is patent on color Doppler imaging with normal direction of blood flow towards the liver. Other: None. IMPRESSION: 1. No cholelithiasis or sonographic evidence of acute cholecystitis. Electronically Signed   By: Elige Ko M.D.   On: 05/29/2022 10:08   DG Chest Portable 1 View  Result Date: 05/20/2022 CLINICAL DATA:  Altered mental status EXAM: PORTABLE CHEST 1 VIEW COMPARISON:  12/31/2021, 04/25/2022 FINDINGS: Chronic elevation left diaphragm. No acute airspace disease or effusion. Normal cardiac size. No pneumothorax IMPRESSION: No active disease.  Chronic elevation of left diaphragm Electronically Signed   By: Jasmine Pang M.D.   On: 05/20/2022 21:13   CT HEAD WO CONTRAST ( )  Result Date: 05/20/2022 CLINICAL DATA:  Altered mental status EXAM: CT HEAD WITHOUT CONTRAST TECHNIQUE: Contiguous axial images were obtained from the base of the skull through the vertex without intravenous contrast. RADIATION DOSE REDUCTION: This exam was performed according to the departmental dose-optimization program which includes automated exposure control, adjustment of  the mA and/or kV according to patient size and/or use of iterative reconstruction technique. COMPARISON:  04/16/2022 FINDINGS: Brain: No evidence of acute infarction, hemorrhage, hydrocephalus, extra-axial collection or mass lesion/mass effect. Mild subcortical white matter and periventricular small vessel ischemic changes. Vascular: No hyperdense vessel or unexpected calcification. Skull: Normal. Negative for fracture or focal lesion. Sinuses/Orbits: The visualized paranasal sinuses are essentially clear. The mastoid air cells are unopacified. Other: None.  IMPRESSION: No evidence of acute intracranial abnormality. Mild small vessel ischemic changes. Electronically Signed   By: Charline Bills M.D.   On: 05/20/2022 21:11    Microbiology: Results for orders placed or performed during the hospital encounter of 05/20/22  Resp Panel by RT-PCR (Flu A&B, Covid) Anterior Nasal Swab     Status: None   Collection Time: 05/20/22  8:19 PM   Specimen: Anterior Nasal Swab  Result Value Ref Range Status   SARS Coronavirus 2 by RT PCR NEGATIVE NEGATIVE Final    Comment: (NOTE) SARS-CoV-2 target nucleic acids are NOT DETECTED.  The SARS-CoV-2 RNA is generally detectable in upper respiratory specimens during the acute phase of infection. The lowest concentration of SARS-CoV-2 viral copies this assay can detect is 138 copies/mL. A negative result does not preclude SARS-Cov-2 infection and should not be used as the sole basis for treatment or other patient management decisions. A negative result may occur with  improper specimen collection/handling, submission of specimen other than nasopharyngeal swab, presence of viral mutation(s) within the areas targeted by this assay, and inadequate number of viral copies(<138 copies/mL). A negative result must be combined with clinical observations, patient history, and epidemiological information. The expected result is Negative.  Fact Sheet for Patients:  BloggerCourse.com  Fact Sheet for Healthcare Providers:  SeriousBroker.it  This test is no t yet approved or cleared by the [place] FDA and  has been authorized for detection and/or diagnosis of SARS-CoV-2 by FDA under an Emergency Use Authorization (EUA). This EUA will remain  in effect (meaning this test can be used) for the duration of the COVID-19 declaration under Section 564(b)(1) of the Act, 21 U.S.C.section 360bbb-3(b)(1), unless the authorization is terminated  or revoked sooner.        Influenza A by PCR NEGATIVE NEGATIVE Final   Influenza B by PCR NEGATIVE NEGATIVE Final    Comment: (NOTE) The Xpert Xpress SARS-CoV-2/FLU/RSV plus assay is intended as an aid in the diagnosis of influenza from Nasopharyngeal swab specimens and should not be used as a sole basis for treatment. Nasal washings and aspirates are unacceptable for Xpert Xpress SARS-CoV-2/FLU/RSV testing.  Fact Sheet for Patients: BloggerCourse.com  Fact Sheet for Healthcare Providers: SeriousBroker.it  This test is not yet approved or cleared by the [place] FDA and has been authorized for detection and/or diagnosis of SARS-CoV-2 by FDA under an Emergency Use Authorization (EUA). This EUA will remain in effect (meaning this test can be used) for the duration of the COVID-19 declaration under Section 564(b)(1) of the Act, 21 U.S.C. section 360bbb-3(b)(1), unless the authorization is terminated or revoked.  Performed at [facility], [address], [place], [place] [address]     Labs: CBC: Recent Labs  Lab 05/27/22 2151 05/27/22 2207 05/28/22 0457 05/29/22 0401 05/30/22 0523 05/31/22 0430  WBC 5.9  --  3.5* 1.8* 1.9* 2.8*  NEUTROABS 5.1  --  2.5 0.9* 1.0* 1.6*  HGB 14.3 15.0 13.1 12.2* 12.1* 12.7*  HCT 43.4 44.0 37.6* 35.6* 35.6* 36.5*  MCV 101.9*  --  97.7 96.7 97.8 96.6  PLT 107*  --  86* 67* 74* 93*   Basic Metabolic Panel: Recent Labs  Lab 05/27/22 2151 05/27/22 2207 05/28/22 0457 05/29/22 0401 05/30/22 0523 05/31/22 0430  NA 143 140 138 136 132* 136  K 4.4 4.2 3.9 3.0* 3.7 3.5  CL 105 104 107 103 103 105  CO2 14*  --  20* 24 22 24   GLUCOSE 119* 122* 113* 107* 93 135*  BUN 15 15 9  <5* <5* 6  CREATININE 1.29* 1.20 0.74 0.68 0.65 0.65  CALCIUM 8.7*  --  7.7* 8.1* 8.5* 9.0  MG  --   --  1.5* 1.7  --   --    Liver Function Tests: Recent Labs  Lab 05/27/22 2151 05/28/22 0457  AST 115* 68*  ALT 51* 40  ALKPHOS  56 47  BILITOT 1.1 1.3*  PROT 7.0 5.8*  ALBUMIN 4.0 3.3*   CBG: No results for input(s): "GLUCAP" in the last 168 hours.  Discharge time spent: greater than 30 minutes.  Signed: 2152, MD [facility] 06/01/2022

## 2022-06-01 NOTE — Progress Notes (Signed)
PT Cancellation Note  Patient Details Name: Gary [name] MRN: [id number] DOB: 05/26/1986   Cancelled Treatment:    Reason Eval/Treat Not Completed: (P) Patient declined, no reason specified Pt refuses, says he already worked with therapy and that he just needs his d/c paperwork.  [name] B. Beverely Risen PT, DPT Acute Rehabilitation Services Please use secure chat or  Call Office [phone]   Elon Alas [facility] 06/01/2022, 11:26 AM

## 2022-06-01 NOTE — Progress Notes (Signed)
DISCHARGE NOTE HOME TAVEON ENYEART to be discharged  he is homeless  per MD order. Discussed prescriptions and follow up appointments with the patient. Prescriptions given to patient; medication list explained in detail. Patient verbalized understanding.  Skin clean, dry and intact without evidence of skin break down, no evidence of skin tears noted. IV catheter discontinued intact. Site without signs and symptoms of complications. Dressing and pressure applied. Pt denies pain at the site currently. No complaints noted.  Patient free of lines, drains, and wounds.   An After Visit Summary (AVS) was printed and given to the patient. Patient escorted via wheelchair, and discharged home via private auto.  Lorine Bears, RN

## 2022-06-01 NOTE — TOC Transition Note (Addendum)
Transition of Care [facility]) - CM/SW Discharge Note   Patient Details  Name: Gary [name] MRN: [id number] Date of Birth: Mar 19, 1986  [facility] CM/SW Contact:  Tom-Johnson, Hershal Coria, RN Phone Number: 06/01/2022, 1:25 PM   Clinical Narrative:     Patient is scheduled for discharge today. Readmission assessment done. No PT/OT needs noted. Bus Pass given to patient. MATCH done for prescriptions, $11 from petty cash used towards prescription and hospital f/u scheduled at [facility]. Info on AVS.  No further TOC needs noted.  Final next level of care: Other (comment) (Homeless) Barriers to Discharge: Barriers Resolved   Patient Goals and CMS Choice Patient states their goals for this hospitalization and ongoing recovery are:: To return home CMS Medicare.gov Compare Post Acute Care list provided to:: Patient Choice offered to / list presented to : Patient  Discharge Placement                Patient to be transferred to facility by: Bus Pass   Patient and family notified of of transfer: 06/01/22  Discharge Plan and Services                DME Arranged: N/A DME Agency: NA       HH Arranged: NA HH Agency: NA        Social Determinants of Health (SDOH) Interventions     Readmission Risk Interventions    06/01/2022    1:14 PM 04/27/2022    9:42 AM  Readmission Risk Prevention Plan  Transportation Screening Complete Complete  Medication Review (RN Care Manager) Referral to Pharmacy Referral to Pharmacy  PCP or Specialist appointment within 3-5 days of discharge Complete Complete  HRI or Home Care Consult Complete Complete  SW Recovery Care/Counseling Consult Complete Complete  Palliative Care Screening Not Applicable Not Applicable  Skilled Nursing Facility Not Applicable Not Applicable

## 2022-06-01 NOTE — TOC CAGE-AID Note (Signed)
Transition of Care [facility]) - CAGE-AID Screening   Patient Details  Name: Gary [name] MRN: [id number] Date of Birth: [birth year]  [facility]) CM/SW Contact:    Milinda Antis, LCSWA Phone Number: 06/01/2022, 11:13 AM   Clinical Narrative: CSW met with the patient at bedside and completed CAGE-AID.  Patient report that he wants to continue with sobriety started while admitted.  Patient reports drinking 1/2 gallon of Vodka a day prior to admission.  CSW inquired about whether the patient would be open to an inpatient facility. Patient reports that he has "a lot of things to take care of" and would prefer outpatient.  CSW presented the patient with both inpatient and outpatient rehab options as well well housing resources.       CAGE-AID Screening:    Have You Ever Felt You Ought to Cut Down on Your Drinking or Drug Use?: Yes Have People Annoyed You By Critizing Your Drinking Or Drug Use?: No Have You Felt Bad Or Guilty About Your Drinking Or Drug Use?: Yes Have You Ever Had a Drink or Used Drugs First Thing In The Morning to Steady Your Nerves or to Get Rid of a Hangover?: Yes CAGE-AID Score: 3  Substance Abuse Education Offered: Yes  Substance abuse interventions: Scientist, clinical (histocompatibility and immunogenetics)

## 2022-06-03 ENCOUNTER — Ambulatory Visit: Payer: Self-pay

## 2022-06-11 ENCOUNTER — Emergency Department (HOSPITAL_COMMUNITY)
Admission: EM | Admit: 2022-06-11 | Discharge: 2022-06-12 | Disposition: A | Discharge status: Home / Self Care | Payer: Self-pay | Attending: Emergency Medicine | Admitting: Emergency Medicine

## 2022-06-11 ENCOUNTER — Emergency Department (HOSPITAL_COMMUNITY): Payer: Self-pay

## 2022-06-11 ENCOUNTER — Other Ambulatory Visit: Payer: Self-pay

## 2022-06-11 DIAGNOSIS — F10129 Alcohol abuse with intoxication, unspecified: Secondary | ICD-10-CM | POA: Insufficient documentation

## 2022-06-11 DIAGNOSIS — Z21 Asymptomatic human immunodeficiency virus [HIV] infection status: Secondary | ICD-10-CM | POA: Insufficient documentation

## 2022-06-11 DIAGNOSIS — F10930 Alcohol use, unspecified with withdrawal, uncomplicated: Secondary | ICD-10-CM

## 2022-06-11 DIAGNOSIS — Z79899 Other long term (current) drug therapy: Secondary | ICD-10-CM | POA: Insufficient documentation

## 2022-06-11 LAB — CBC
HCT: 39 % (ref 39.0–52.0)
Hemoglobin: 13.2 g/dL (ref 13.0–17.0)
MCH: 33.7 pg (ref 26.0–34.0)
MCHC: 33.8 g/dL (ref 30.0–36.0)
MCV: 99.5 fL (ref 80.0–100.0)
Platelets: 275 10*3/uL (ref 150–400)
RBC: 3.92 MIL/uL — ABNORMAL LOW (ref 4.22–5.81)
RDW: 14.4 % (ref 11.5–15.5)
WBC: 5.9 10*3/uL (ref 4.0–10.5)
nRBC: 0 % (ref 0.0–0.2)

## 2022-06-11 LAB — ETHANOL: Alcohol, Ethyl (B): 174 mg/dL — ABNORMAL HIGH (ref <10)

## 2022-06-11 LAB — COMPREHENSIVE METABOLIC PANEL
ALT: 23 U/L (ref 0–44)
AST: 33 U/L (ref 15–41)
Albumin: 3.8 g/dL (ref 3.5–5.0)
Alkaline Phosphatase: 43 U/L (ref 38–126)
Anion gap: 14 (ref 5–15)
BUN: 7 mg/dL (ref 6–20)
CO2: 21 mmol/L — ABNORMAL LOW (ref 22–32)
Calcium: 8.8 mg/dL — ABNORMAL LOW (ref 8.9–10.3)
Chloride: 104 mmol/L (ref 98–111)
Creatinine, Ser: 0.65 mg/dL (ref 0.61–1.24)
GFR, Estimated: >60 mL/min (ref >60)
Glucose, Bld: 87 mg/dL (ref 70–99)
Potassium: 4 mmol/L (ref 3.5–5.1)
Sodium: 139 mmol/L (ref 135–145)
Total Bilirubin: 0.7 mg/dL (ref 0.3–1.2)
Total Protein: 6.4 g/dL — ABNORMAL LOW (ref 6.5–8.1)

## 2022-06-11 LAB — CBG MONITORING, ED: Glucose-Capillary: 87 mg/dL (ref 70–99)

## 2022-06-11 MED ORDER — SODIUM CHLORIDE 0.9 % IV BOLUS
1000.0000 mL | Freq: Once | INTRAVENOUS | Status: AC
  Administered 2022-06-11: 1000 mL via INTRAVENOUS

## 2022-06-11 MED ORDER — LORAZEPAM 1 MG PO TABS: 0.0000 mg | ORAL_TABLET | Freq: Two times a day (BID) | ORAL | Status: DC

## 2022-06-11 MED ORDER — LORAZEPAM 2 MG/ML IJ SOLN
0.0000 mg | Freq: Four times a day (QID) | INTRAMUSCULAR | Status: DC
  Administered 2022-06-11: 2 mg via INTRAVENOUS
  Filled 2022-06-11: qty 1

## 2022-06-11 MED ORDER — LORAZEPAM 1 MG PO TABS
0.0000 mg | ORAL_TABLET | Freq: Four times a day (QID) | ORAL | Status: DC
  Administered 2022-06-12: 2 mg via ORAL
  Filled 2022-06-11: qty 2

## 2022-06-11 MED ORDER — LORAZEPAM 2 MG/ML IJ SOLN: 0.0000 mg | Freq: Two times a day (BID) | INTRAMUSCULAR | Status: DC

## 2022-06-11 MED ORDER — THIAMINE HCL 100 MG/ML IJ SOLN
100.0000 mg | Freq: Every day | INTRAMUSCULAR | Status: DC
  Administered 2022-06-11: 100 mg via INTRAVENOUS
  Filled 2022-06-11: qty 2

## 2022-06-11 MED ORDER — THIAMINE HCL 100 MG PO TABS: 100.0000 mg | ORAL_TABLET | Freq: Every day | ORAL | Status: DC

## 2022-06-11 NOTE — ED Notes (Signed)
EDP at bedside  

## 2022-06-11 NOTE — ED Provider Notes (Signed)
[facility] EMERGENCY DEPARTMENT Provider Note   CSN: [id number] Arrival date & time: 06/11/22  2214     History  Chief Complaint  Patient presents with   Tremors    Gary [name] is a 36 y.o. male.  HPI     This is a 36 year old male with a history of epilepsy, HIV and alcohol abuse who presents with tremors.  Patient reports he has not had alcohol since 2 AM.  He states he normally drinks half a gallon of vodka daily.  He has done this for the last 5 years.  He has not taken his seizure meds in several days at least.  Reports increased tremor and "going out of it."  Also reports chest discomfort.  Denies shortness of breath.  Denies fevers.  Does report methamphetamine use several days ago as well.  Home Medications Prior to Admission medications   Medication Sig Start Date End Date Taking? Authorizing Provider  chlordiazePOXIDE (LIBRIUM) 25 MG capsule 50mg  PO TID x 1D, then 25-50mg  PO BID X 1D, then 25-50mg  PO QD X 1D 06/12/22  Yes Jackalyn Haith, Barbette Hair, MD  levETIRAcetam (KEPPRA) 500 MG tablet Take 1 tablet (500 mg total) by mouth 2 (two) times daily. 06/12/22  Yes Shasta Chinn, Barbette Hair, MD  bictegravir-emtricitabine-tenofovir AF (BIKTARVY) 50-200-25 MG TABS tablet Take 1 tablet by mouth daily. 05/28/22   Alma Friendly, MD  folic acid (FOLVITE) 1 MG tablet Take 1 tablet (1 mg total) by mouth daily. 05/01/22   Hosie Poisson, MD  gabapentin (NEURONTIN) 300 MG capsule Take 1 capsule (300 mg total) by mouth 3 (three) times daily. Patient taking differently: Take 900 mg by mouth 3 (three) times daily as needed (nerve pain). 05/01/22   Hosie Poisson, MD  levETIRAcetam (KEPPRA) 500 MG tablet Take 1 tablet (500 mg total) by mouth 2 (two) times daily. 06/01/22   Alma Friendly, MD  Multiple Vitamin (MULTIVITAMIN WITH MINERALS) TABS tablet Take 1 tablet by mouth daily. 05/02/22   Hosie Poisson, MD  pantoprazole (PROTONIX) 40 MG tablet Take 1 tablet (40 mg total) by mouth  2 (two) times daily. Patient not taking: Reported on 05/21/2022 05/01/22   Hosie Poisson, MD  thiamine 100 MG tablet Take 1 tablet (100 mg total) by mouth daily. Patient taking differently: Take 100 mg by mouth in the morning and at bedtime. 05/01/22   Hosie Poisson, MD  famotidine (PEPCID) 20 MG tablet Take 1 tablet (20 mg total) by mouth 2 (two) times daily. Patient not taking: Reported on 06/01/2019 05/14/19 06/02/19  Davonna Belling, MD      Allergies    Tegretol [carbamazepine] and Caffeine    Review of Systems   Review of Systems  Cardiovascular:  Positive for chest pain.  Neurological:  Positive for tremors.  All other systems reviewed and are negative.   Physical Exam Updated Vital Signs BP 100/67   Pulse 72   Temp 97.7 F (36.5 C) (Temporal)   Resp 15   Ht 1.829 m (6')   Wt 68 kg   SpO2 98%   BMI 20.34 kg/m  Physical Exam Vitals and nursing note reviewed.  Constitutional:      Appearance: He is well-developed. He is ill-appearing. He is not toxic-appearing or diaphoretic.  HENT:     Head: Normocephalic and atraumatic.     Mouth/Throat:     Mouth: Mucous membranes are dry.  Eyes:     Pupils: Pupils are equal, round, and reactive to light.  Cardiovascular:     Rate and Rhythm: Normal rate and regular rhythm.     Heart sounds: Normal heart sounds. No murmur heard. Pulmonary:     Effort: Pulmonary effort is normal. No respiratory distress.     Breath sounds: Normal breath sounds. No wheezing.  Abdominal:     General: Bowel sounds are normal.     Palpations: Abdomen is soft.     Tenderness: There is no abdominal tenderness. There is no rebound.  Musculoskeletal:     Cervical back: Neck supple.  Lymphadenopathy:     Cervical: No cervical adenopathy.  Skin:    General: Skin is warm and dry.  Neurological:     Mental Status: He is alert and oriented to person, place, and time.     Comments: Tremulous but nonfocal  Psychiatric:        Mood and Affect: Mood  normal.     ED Results / Procedures / Treatments   Labs (all labs ordered are listed, but only abnormal results are displayed) Labs Reviewed  COMPREHENSIVE METABOLIC PANEL - Abnormal; Notable for the following components:      Result Value   CO2 21 (*)    Calcium 8.8 (*)    Total Protein 6.4 (*)    All other components within normal limits  ETHANOL - Abnormal; Notable for the following components:   Alcohol, Ethyl (B) 174 (*)    All other components within normal limits  CBC - Abnormal; Notable for the following components:   RBC 3.92 (*)    All other components within normal limits  RAPID URINE DRUG SCREEN, HOSP PERFORMED  CBG MONITORING, ED  TROPONIN I (HIGH SENSITIVITY)  TROPONIN I (HIGH SENSITIVITY)    EKG EKG Interpretation  Date/Time:  Thursday June 11 2022 23:19:32 EDT Ventricular Rate:  84 PR Interval:  146 QRS Duration: 105 QT Interval:  412 QTC Calculation: 487 R Axis:   60 Text Interpretation: Sinus rhythm Borderline T wave abnormalities Borderline prolonged QT interval Confirmed by Ross Marcus ([id number]) on 06/12/2022 1:02:39 AM  Radiology DG Chest Portable 1 View  Result Date: 06/11/2022 CLINICAL DATA:  Chest pain, withdrawal. EXAM: PORTABLE CHEST 1 VIEW COMPARISON:  Chest x-ray 05/20/2022 FINDINGS: There is stable moderate elevation of the left hemidiaphragm. The lungs are clear. No pleural effusion or pneumothorax. Cardiomediastinal silhouette is within normal limits. No acute fractures are seen. IMPRESSION: No active disease. Stable chronic elevation of the left hemidiaphragm. Electronically Signed   By: Darliss Cheney M.D.   On: 06/11/2022 23:54    Procedures Procedures    Medications Ordered in ED Medications  LORazepam (ATIVAN) injection 0-4 mg (2 mg Intravenous Given 06/11/22 2318)    Or  LORazepam (ATIVAN) tablet 0-4 mg ( Oral See Alternative 06/11/22 2318)  LORazepam (ATIVAN) injection 0-4 mg (has no administration in time range)    Or   LORazepam (ATIVAN) tablet 0-4 mg (has no administration in time range)  thiamine (VITAMIN B1) tablet 100 mg ( Oral See Alternative 06/11/22 2320)    Or  thiamine (VITAMIN B1) injection 100 mg (100 mg Intravenous Given 06/11/22 2320)  sodium chloride 0.9 % bolus 1,000 mL (0 mLs Intravenous Stopped 06/12/22 0124)  levETIRAcetam (KEPPRA) IVPB 1000 mg/100 mL premix (0 mg Intravenous Stopped 06/12/22 0351)    ED Course/ Medical Decision Making/ A&P                           Medical Decision  Making Amount and/or Complexity of Data Reviewed Labs: ordered. Radiology: ordered.  Risk OTC drugs. Prescription drug management.   This patient presents to the ED for concern of alcohol withdrawal, this involves an extensive number of treatment options, and is a complaint that carries with it a high risk of complications and morbidity.  I considered the following differential and admission for this acute, potentially life threatening condition.  The differential diagnosis includes intoxication, withdrawal, metabolic derangements  MDM:    This is a 36 year old male with a known history of alcohol use disorder and seizures.  Patient reports worsening withdrawal symptoms.  No recent seizures but has not been taking his medications.  Reports last EtOH approximately 24 hours ago; however, blood alcohol level is 174 which would suggest that he has been drinking.  Initial CIWA 14, repeat 10.  He is resting comfortably.  He has no significant metabolic derangements.  He is previously been admitted to the hospital based on chart review for AKI and metabolic derangements.  Patient was loaded with Keppra and given Ativan appropriately.  Will provide with outpatient resources and Librium taper.  He was also provided with prescription for his outpatient Keppra.  (Labs, imaging, consults)  Labs: I Ordered, and personally interpreted labs.  The pertinent results include: CBC, BMP, ethanol level, troponin  Imaging Studies  ordered: I ordered imaging studies including Chest x-ray I independently visualized and interpreted imaging. I agree with the radiologist interpretation  Additional history obtained from chart review.  External records from outside source obtained and reviewed including prior evaluations  Cardiac Monitoring: The patient was maintained on a cardiac monitor.  I personally viewed and interpreted the cardiac monitored which showed an underlying rhythm of: Normal sinus rhythm  Reevaluation: After the interventions noted above, I reevaluated the patient and found that they have :improved  Social Determinants of Health: Alcohol abuse  Disposition:  discharge  Co morbidities that complicate the patient evaluation  Past Medical History:  Diagnosis Date   Alcohol abuse    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   Anxiety    Bipolar 2 disorder (HCC)    HIV (human immunodeficiency virus infection) (HCC)    PTSD (post-traumatic stress disorder)    Schizophrenia (HCC)    Seizures (HCC)    Subdural hematoma (HCC)      Medicines Meds ordered this encounter  Medications   OR Linked Order Group    LORazepam (ATIVAN) injection 0-4 mg     Order Specific Question:   CIWA-AR < 5 =     Answer:   0 mg     Order Specific Question:   CIWA-AR 5 -10 =     Answer:   1 mg     Order Specific Question:   CIWA-AR 11 -15 =     Answer:   2 mg     Order Specific Question:   CIWA-AR 16 -24 =     Answer:   2 mg     Order Specific Question:   CIWA-AR 16 -24 =     Answer:   Recheck CIWA-AR in 1 hour; if > 15 notify MD     Order Specific Question:   CIWA-AR > 24 =     Answer:   4 mg     Order Specific Question:   CIWA-AR > 24 =     Answer:   Call Rapid Response    LORazepam (ATIVAN) tablet 0-4 mg     Order Specific Question:   CIWA-AR <  5 =     Answer:   0 mg     Order Specific Question:   CIWA-AR 5 -10 =     Answer:   1 mg     Order Specific Question:   CIWA-AR 11 -15 =     Answer:   2 mg     Order  Specific Question:   CIWA-AR 16 -24 =     Answer:   2 mg     Order Specific Question:   CIWA-AR 16 -24 =     Answer:   Recheck CIWA-AR in 1 hour; if > 15 notify MD     Order Specific Question:   CIWA-AR > 24 =     Answer:   4 mg     Order Specific Question:   CIWA-AR > 24 =     Answer:   Call Rapid Response   OR Linked Order Group    LORazepam (ATIVAN) injection 0-4 mg     Order Specific Question:   CIWA-AR < 5 =     Answer:   0 mg     Order Specific Question:   CIWA-AR 5 -10 =     Answer:   1 mg     Order Specific Question:   CIWA-AR 11 -15 =     Answer:   2 mg     Order Specific Question:   CIWA-AR 16 -24 =     Answer:   2 mg     Order Specific Question:   CIWA-AR 16 -24 =     Answer:   Recheck CIWA-AR in 1 hour; if > 15 notify MD     Order Specific Question:   CIWA-AR > 24 =     Answer:   4 mg     Order Specific Question:   CIWA-AR > 24 =     Answer:   Call Rapid Response    LORazepam (ATIVAN) tablet 0-4 mg     Order Specific Question:   CIWA-AR < 5 =     Answer:   0 mg     Order Specific Question:   CIWA-AR 5 -10 =     Answer:   1 mg     Order Specific Question:   CIWA-AR 11 -15 =     Answer:   2 mg     Order Specific Question:   CIWA-AR 16 -24 =     Answer:   2 mg     Order Specific Question:   CIWA-AR 16 -24 =     Answer:   Recheck CIWA-AR in 1 hour; if > 15 notify MD     Order Specific Question:   CIWA-AR > 24 =     Answer:   4 mg     Order Specific Question:   CIWA-AR > 24 =     Answer:   Call Rapid Response   OR Linked Order Group    thiamine (VITAMIN B1) tablet 100 mg    thiamine (VITAMIN B1) injection 100 mg   sodium chloride 0.9 % bolus 1,000 mL   levETIRAcetam (KEPPRA) IVPB 1000 mg/100 mL premix   levETIRAcetam (KEPPRA) 500 MG tablet    Sig: Take 1 tablet (500 mg total) by mouth 2 (two) times daily.    Dispense:  60 tablet    Refill:  0   chlordiazePOXIDE (LIBRIUM) 25 MG capsule    Sig: 50mg  PO TID x 1D, then 25-50mg  PO BID X  1D, then 25-50mg  PO QD X 1D     Dispense:  10 capsule    Refill:  0    I have reviewed the patients home medicines and have made adjustments as needed  Problem List / ED Course: Problem List Items Addressed This Visit       Other   Alcohol withdrawal ([place]) - Primary                Final Clinical Impression(s) / ED Diagnoses Final diagnoses:  Alcohol withdrawal syndrome without complication ([place])    Rx / DC Orders ED Discharge Orders          Ordered    levETIRAcetam (KEPPRA) 500 MG tablet  2 times daily        06/12/22 0535    chlordiazePOXIDE (LIBRIUM) 25 MG capsule        06/12/22 0537              Merryl Hacker, MD 06/12/22 [phone]

## 2022-06-11 NOTE — ED Triage Notes (Signed)
Pt BIB [facility] from [facility] called out due to increasing tremors. Pt states he suspects its secondary to alcohol withdraw. Endorses daily ETOH intake with last drink at 2 am today.

## 2022-06-12 LAB — TROPONIN I (HIGH SENSITIVITY)
Troponin I (High Sensitivity): 4 ng/L (ref <18)
Troponin I (High Sensitivity): 5 ng/L (ref <18)

## 2022-06-12 MED ORDER — LEVETIRACETAM 500 MG PO TABS: 500.0000 mg | ORAL_TABLET | Freq: Two times a day (BID) | ORAL | 0 refills | Status: DC

## 2022-06-12 MED ORDER — LEVETIRACETAM IN NACL 1000 MG/100ML IV SOLN
1000.0000 mg | Freq: Once | INTRAVENOUS | Status: AC
  Administered 2022-06-12: 1000 mg via INTRAVENOUS
  Filled 2022-06-12: qty 100

## 2022-06-12 MED ORDER — CHLORDIAZEPOXIDE HCL 25 MG PO CAPS: ORAL_CAPSULE | ORAL | 0 refills | Status: DC

## 2022-06-12 NOTE — Discharge Instructions (Signed)
You were seen today with concerns for alcohol withdrawal symptoms.  You will be given a refill of your Keppra and Librium for withdrawal symptoms.  You need to seek outpatient resources.

## 2022-06-12 NOTE — ED Notes (Signed)
Pt time of discharge pt states inability to afford medication. Pt stated "I dont have any where to go." SW/CM consult placed. Pt provided with bus pass.

## 2022-06-12 NOTE — ED Notes (Signed)
Discharge instructions discussed with pt. Pt verbalized understanding with no questions at this time. Pt was provided with bus pass from charge rn. Pt wheelchair to lobby to await bus.

## 2022-06-18 ENCOUNTER — Other Ambulatory Visit: Payer: Self-pay

## 2022-06-18 ENCOUNTER — Emergency Department
Admission: EM | Admit: 2022-06-18 | Discharge: 2022-06-19 | Disposition: A | Discharge status: Other Acute Inpatient Hospital | Payer: No Payment, Other | Attending: Student in an Organized Health Care Education/Training Program | Admitting: Student in an Organized Health Care Education/Training Program

## 2022-06-18 ENCOUNTER — Encounter: Payer: Self-pay | Admitting: Emergency Medicine

## 2022-06-18 DIAGNOSIS — F10929 Alcohol use, unspecified with intoxication, unspecified: Secondary | ICD-10-CM | POA: Diagnosis present

## 2022-06-18 DIAGNOSIS — Z5900 Homelessness unspecified: Secondary | ICD-10-CM

## 2022-06-18 DIAGNOSIS — F10220 Alcohol dependence with intoxication, uncomplicated: Secondary | ICD-10-CM | POA: Insufficient documentation

## 2022-06-18 DIAGNOSIS — F10982 Alcohol use, unspecified with alcohol-induced sleep disorder: Secondary | ICD-10-CM | POA: Diagnosis present

## 2022-06-18 DIAGNOSIS — S065XAA Traumatic subdural hemorrhage with loss of consciousness status unknown, initial encounter: Secondary | ICD-10-CM | POA: Diagnosis present

## 2022-06-18 DIAGNOSIS — F10921 Alcohol use, unspecified with intoxication delirium: Secondary | ICD-10-CM | POA: Diagnosis present

## 2022-06-18 DIAGNOSIS — B20 Human immunodeficiency virus [HIV] disease: Secondary | ICD-10-CM | POA: Diagnosis present

## 2022-06-18 DIAGNOSIS — Z21 Asymptomatic human immunodeficiency virus [HIV] infection status: Secondary | ICD-10-CM | POA: Diagnosis present

## 2022-06-18 DIAGNOSIS — F10939 Alcohol use, unspecified with withdrawal, unspecified: Secondary | ICD-10-CM | POA: Diagnosis present

## 2022-06-18 DIAGNOSIS — Z87898 Personal history of other specified conditions: Secondary | ICD-10-CM

## 2022-06-18 DIAGNOSIS — F1094 Alcohol use, unspecified with alcohol-induced mood disorder: Secondary | ICD-10-CM | POA: Diagnosis present

## 2022-06-18 DIAGNOSIS — Z20822 Contact with and (suspected) exposure to covid-19: Secondary | ICD-10-CM | POA: Insufficient documentation

## 2022-06-18 DIAGNOSIS — F10920 Alcohol use, unspecified with intoxication, uncomplicated: Secondary | ICD-10-CM | POA: Diagnosis present

## 2022-06-18 DIAGNOSIS — F332 Major depressive disorder, recurrent severe without psychotic features: Secondary | ICD-10-CM | POA: Insufficient documentation

## 2022-06-18 DIAGNOSIS — F1020 Alcohol dependence, uncomplicated: Secondary | ICD-10-CM | POA: Diagnosis present

## 2022-06-18 DIAGNOSIS — F17210 Nicotine dependence, cigarettes, uncomplicated: Secondary | ICD-10-CM | POA: Diagnosis present

## 2022-06-18 DIAGNOSIS — E440 Moderate protein-calorie malnutrition: Secondary | ICD-10-CM | POA: Insufficient documentation

## 2022-06-18 DIAGNOSIS — G4700 Insomnia, unspecified: Secondary | ICD-10-CM | POA: Insufficient documentation

## 2022-06-18 DIAGNOSIS — F1914 Other psychoactive substance abuse with psychoactive substance-induced mood disorder: Secondary | ICD-10-CM | POA: Insufficient documentation

## 2022-06-18 DIAGNOSIS — F1510 Other stimulant abuse, uncomplicated: Secondary | ICD-10-CM | POA: Insufficient documentation

## 2022-06-18 DIAGNOSIS — F1994 Other psychoactive substance use, unspecified with psychoactive substance-induced mood disorder: Secondary | ICD-10-CM | POA: Diagnosis present

## 2022-06-18 DIAGNOSIS — F10229 Alcohol dependence with intoxication, unspecified: Secondary | ICD-10-CM | POA: Diagnosis present

## 2022-06-18 DIAGNOSIS — F1010 Alcohol abuse, uncomplicated: Secondary | ICD-10-CM | POA: Diagnosis present

## 2022-06-18 LAB — COMPREHENSIVE METABOLIC PANEL
ALT: 16 U/L (ref 0–44)
AST: 34 U/L (ref 15–41)
Albumin: 4 g/dL (ref 3.5–5.0)
Alkaline Phosphatase: 52 U/L (ref 38–126)
Anion gap: 13 (ref 5–15)
BUN: 12 mg/dL (ref 6–20)
CO2: 21 mmol/L — ABNORMAL LOW (ref 22–32)
Calcium: 9.3 mg/dL (ref 8.9–10.3)
Chloride: 109 mmol/L (ref 98–111)
Creatinine, Ser: 0.67 mg/dL (ref 0.61–1.24)
GFR, Estimated: >60 mL/min (ref >60)
Glucose, Bld: 91 mg/dL (ref 70–99)
Potassium: 3.8 mmol/L (ref 3.5–5.1)
Sodium: 143 mmol/L (ref 135–145)
Total Bilirubin: 0.7 mg/dL (ref 0.3–1.2)
Total Protein: 7.4 g/dL (ref 6.5–8.1)

## 2022-06-18 LAB — CBC
HCT: 42.2 % (ref 39.0–52.0)
Hemoglobin: 14.5 g/dL (ref 13.0–17.0)
MCH: 33 pg (ref 26.0–34.0)
MCHC: 34.4 g/dL (ref 30.0–36.0)
MCV: 96.1 fL (ref 80.0–100.0)
Platelets: 162 10*3/uL (ref 150–400)
RBC: 4.39 MIL/uL (ref 4.22–5.81)
RDW: 14.3 % (ref 11.5–15.5)
WBC: 4.5 10*3/uL (ref 4.0–10.5)
nRBC: 0 % (ref 0.0–0.2)

## 2022-06-18 LAB — ETHANOL: Alcohol, Ethyl (B): 339 mg/dL (ref <10)

## 2022-06-18 MED ORDER — ADULT MULTIVITAMIN W/MINERALS CH
1.0000 | ORAL_TABLET | Freq: Every day | ORAL | Status: DC
  Administered 2022-06-19: 1 via ORAL
  Filled 2022-06-18: qty 1

## 2022-06-18 MED ORDER — LEVETIRACETAM 500 MG PO TABS
500.0000 mg | ORAL_TABLET | Freq: Two times a day (BID) | ORAL | Status: DC
  Administered 2022-06-19 (×2): 500 mg via ORAL
  Filled 2022-06-18 (×2): qty 1

## 2022-06-18 MED ORDER — THIAMINE HCL 100 MG PO TABS
100.0000 mg | ORAL_TABLET | Freq: Every day | ORAL | Status: DC
  Administered 2022-06-19: 100 mg via ORAL
  Filled 2022-06-18: qty 1

## 2022-06-18 MED ORDER — BICTEGRAVIR-EMTRICITAB-TENOFOV 50-200-25 MG PO TABS
1.0000 | ORAL_TABLET | Freq: Every day | ORAL | Status: DC
  Administered 2022-06-19: 1 via ORAL
  Filled 2022-06-18: qty 1

## 2022-06-18 MED ORDER — THIAMINE HCL 100 MG/ML IJ SOLN: 100.0000 mg | Freq: Every day | INTRAMUSCULAR | Status: DC

## 2022-06-18 MED ORDER — LORAZEPAM 1 MG PO TABS
1.0000 mg | ORAL_TABLET | ORAL | Status: DC | PRN
  Administered 2022-06-19: 3 mg via ORAL
  Administered 2022-06-19 (×3): 1 mg via ORAL
  Filled 2022-06-18: qty 3
  Filled 2022-06-18 (×3): qty 1

## 2022-06-18 MED ORDER — FOLIC ACID 1 MG PO TABS
1.0000 mg | ORAL_TABLET | Freq: Every day | ORAL | Status: DC
  Administered 2022-06-19: 1 mg via ORAL
  Filled 2022-06-18: qty 1

## 2022-06-18 MED ORDER — LORAZEPAM 2 MG/ML IJ SOLN: 1.0000 mg | INTRAMUSCULAR | Status: DC | PRN

## 2022-06-18 NOTE — Consult Note (Signed)
[facility] Face-to-Face Psychiatry Consult   Reason for Consult: Alcohol Intoxication Referring Physician: Dr. Roxan Hockey Patient Identification: Gary [name] MRN:  [id number] Principal Diagnosis: <principal problem not specified> Diagnosis:  Active Problems:   HIV disease (HCC)   Cigarette smoker   Alcohol use disorder, severe, dependence (HCC)   Major depressive disorder, recurrent severe without psychotic features (HCC)   Alcohol-induced mood disorder (HCC)   Traumatic subdural hematoma (HCC)   Alcohol intoxication with delirium (HCC)   Malnutrition of moderate degree   Substance induced mood disorder (HCC)   Alcohol abuse   Amphetamine abuse (HCC)   MDD (major depressive disorder), recurrent episode, severe (HCC)   Alcohol-induced insomnia (HCC)   History of seizures   Homelessness   Alcohol withdrawal (HCC)   Total Time spent with patient: 1 hour  Subjective: "I need help with the drinking."  Gary [name] is a 35 y.o. male patient presented to [facility] ED via [facility] due to alcohol intoxication. The patient share that he drinks every day. He discussed that he was going to drink himself to death. The patient's BAL is 339 mg/dl.  This provider saw The patient face-to-face; the chart was reviewed, and consulted with Dr. Roxan Hockey on 06/18/2022 due to the patient's care. It was discussed with the EDP  that the patient remained under observation overnight and will be reassessed in the a.m. to determine if he meets the criteria for psychiatric inpatient admission; he could be discharged home.  On evaluation, the patient is alert and oriented x 4, very intoxicated, anxious but cooperative, and mood-congruent with affect. The patient does appear to be responding to external stimuli then internal stimuli. The patient is presenting with some delusional thinking. The patient denies auditory hallucinations but denies visual hallucinations. The patient admits to suicidal ideation but denies homicidal  or self-harm ideations. The patient is presenting with some psychotic and paranoid behaviors.   HPI:   Past Psychiatric History:  Alcohol abuse Anxiety Bipolar 2 disorder (HCC) PTSD (post-traumatic stress disorder) Schizophrenia (HCC) Seizures (HCC)   Risk to Self:   Risk to Others:   Prior Inpatient Therapy:   Prior Outpatient Therapy:    Past Medical History:  Past Medical History:  Diagnosis Date   Alcohol abuse    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   Anxiety    Bipolar 2 disorder (HCC)    HIV (human immunodeficiency virus infection) (HCC)    PTSD (post-traumatic stress disorder)    Schizophrenia (HCC)    Seizures (HCC)    Subdural hematoma (HCC)     Past Surgical History:  Procedure Laterality Date   BIOPSY  04/19/2022   Procedure: BIOPSY;  Surgeon: Lemar Lofty., MD;  Location: [facility] ENDOSCOPY;  Service: Gastroenterology;;   ENTEROSCOPY N/A 04/19/2022   Procedure: ENTEROSCOPY;  Surgeon: Lemar Lofty., MD;  Location: [facility] ENDOSCOPY;  Service: Gastroenterology;  Laterality: N/A;   INCISION AND DRAINAGE PERIRECTAL ABSCESS N/A 09/24/2016   Procedure: IRRIGATION AND DEBRIDEMENT PERIRECTAL ABSCESS;  Surgeon: Ricarda Frame, MD;  Location: [facility] ORS;  Service: General;  Laterality: N/A;   none     Family History:  Family History  Problem Relation Age of Onset   Alcohol abuse Mother    Alcohol abuse Father    Colon cancer Other    Other Other    Cancer Other    Family Psychiatric  History:  Social History:  Social History   Substance and Sexual Activity  Alcohol Use Yes   Alcohol/week: 105.0 standard drinks of alcohol  Types: 105 Cans of beer per week   Comment: 15 beers/day (105 beers/week), 1 fifth of liquor per day (7/week)     Social History   Substance and Sexual Activity  Drug Use Not Currently   Types: Methamphetamines, Marijuana   Comment: last used 04/10/2019    Social History   Socioeconomic History   Marital status: Single     Spouse name: Not on file   Number of children: Not on file   Years of education: Not on file   Highest education level: Not on file  Occupational History   Occupation: unemployed  Tobacco Use   Smoking status: Every Day    Packs/day: 0.50    Types: Cigarettes   Smokeless tobacco: Never   Tobacco comments:    unable to smoke while incarcerated 6+ months 02/13/20  Vaping Use   Vaping Use: Never used  Substance and Sexual Activity   Alcohol use: Yes    Alcohol/week: 105.0 standard drinks of alcohol    Types: 105 Cans of beer per week    Comment: 15 beers/day (105 beers/week), 1 fifth of liquor per day (7/week)   Drug use: Not Currently    Types: Methamphetamines, Marijuana    Comment: last used 04/10/2019   Sexual activity: Yes    Partners: Male, Male    Comment: declined condoms  03/2021  Other Topics Concern   Not on file  Social History Narrative   "Currently living on the streets"   Independent at baseline   Occasionally goes to the [facility]   Social Determinants of Health   Financial Resource Strain: Not on file  Food Insecurity: Food Insecurity Present (04/27/2022)   Hunger Vital Sign    Worried About Running Out of Food in the Last Year: Sometimes true    Ran Out of Food in the Last Year: Sometimes true  Transportation Needs: Unmet Transportation Needs (04/27/2022)   PRAPARE - Administrator, Civil Service (Medical): Yes    Lack of Transportation (Non-Medical): Yes  Physical Activity: Not on file  Stress: Not on file  Social Connections: Not on file   Additional Social History:    Allergies:   Allergies  Allergen Reactions   Tegretol [Carbamazepine] Other (See Comments)    Caused vertigo for 2 days after taking it   Caffeine Palpitations    Labs:  Results for orders placed or performed during the hospital encounter of 06/18/22 (from the past 48 hour(s))  Comprehensive metabolic panel     Status: Abnormal   Collection Time: 06/18/22  8:16 PM   Result Value Ref Range   Sodium 143 135 - 145 mmol/L   Potassium 3.8 3.5 - 5.1 mmol/L   Chloride 109 98 - 111 mmol/L   CO2 21 (L) 22 - 32 mmol/L   Glucose, Bld 91 70 - 99 mg/dL    Comment: Glucose reference range applies only to samples taken after fasting for at least 8 hours.   BUN 12 6 - 20 mg/dL   Creatinine, Ser 0.16 0.61 - 1.24 mg/dL   Calcium 9.3 8.9 - 01.0 mg/dL   Total Protein 7.4 6.5 - 8.1 g/dL   Albumin 4.0 3.5 - 5.0 g/dL   AST 34 15 - 41 U/L   ALT 16 0 - 44 U/L   Alkaline Phosphatase 52 38 - 126 U/L   Total Bilirubin 0.7 0.3 - 1.2 mg/dL   GFR, Estimated >93 >23 mL/min    Comment: (NOTE) Calculated using the CKD-EPI  Creatinine Equation (2021)    Anion gap 13 5 - 15    Comment: Performed at [facility], [address] [place], [place] [address]  Ethanol     Status: Abnormal   Collection Time: 06/18/22  8:16 PM  Result Value Ref Range   Alcohol, Ethyl (B) 339 (HH) <10 mg/dL    Comment: CRITICAL RESULT CALLED TO, READ BACK BY AND VERIFIED WITH AMY COHEN 06/18/22 2105 [facility] (NOTE) Lowest detectable limit for serum alcohol is 10 mg/dL.  For medical purposes only. Performed at [facility], [address] [place], [place] [address]   cbc     Status: None   Collection Time: 06/18/22  8:16 PM  Result Value Ref Range   WBC 4.5 4.0 - 10.5 K/uL   RBC 4.39 4.22 - 5.81 MIL/uL   Hemoglobin 14.5 13.0 - 17.0 g/dL   HCT 57.3 22.0 - 25.4 %   MCV 96.1 80.0 - 100.0 fL   MCH 33.0 26.0 - 34.0 pg   MCHC 34.4 30.0 - 36.0 g/dL   RDW 27.0 62.3 - 76.2 %   Platelets 162 150 - 400 K/uL   nRBC 0.0 0.0 - 0.2 %    Comment: Performed at [facility], [address] [place], [place] [address]    No current facility-administered medications for this encounter.   Current Outpatient Medications  Medication Sig Dispense Refill   bictegravir-emtricitabine-tenofovir AF (BIKTARVY) 50-200-25 MG TABS tablet Take 1 tablet by mouth daily. 30 tablet 0    gabapentin (NEURONTIN) 300 MG capsule Take 1 capsule (300 mg total) by mouth 3 (three) times daily. (Patient taking differently: Take 900 mg by mouth 3 (three) times daily as needed (nerve pain).) 90 capsule 3   levETIRAcetam (KEPPRA) 500 MG tablet Take 1 tablet (500 mg total) by mouth 2 (two) times daily. 60 tablet 3   Multiple Vitamin (MULTIVITAMIN WITH MINERALS) TABS tablet Take 1 tablet by mouth daily. 30 tablet 3   thiamine 100 MG tablet Take 1 tablet (100 mg total) by mouth daily. (Patient taking differently: Take 100 mg by mouth in the morning and at bedtime.) 30 tablet 3   chlordiazePOXIDE (LIBRIUM) 25 MG capsule 50mg  PO TID x 1D, then 25-50mg  PO BID X 1D, then 25-50mg  PO QD X 1D (Patient not taking: Reported on 06/18/2022) 10 capsule 0   folic acid (FOLVITE) 1 MG tablet Take 1 tablet (1 mg total) by mouth daily. (Patient not taking: Reported on 06/18/2022) 30 tablet 3   pantoprazole (PROTONIX) 40 MG tablet Take 1 tablet (40 mg total) by mouth 2 (two) times daily. (Patient not taking: Reported on 05/21/2022) 60 tablet 3    Musculoskeletal: Strength & Muscle Tone: within normal limits Gait & Station: normal Patient leans: N/A Psychiatric Specialty Exam:  Presentation  General Appearance: Disheveled  Eye Contact:Good  Speech:Garbled; Slurred  Speech Volume:Decreased  Handedness:Right   Mood and Affect  Mood:Euthymic  Affect:Blunt; Congruent; Depressed; Inappropriate   Thought Process  Thought Processes:Coherent  Descriptions of Associations:Intact  Orientation:Full (Time, Place and Person)  Thought Content:Logical  History of Schizophrenia/Schizoaffective disorder:No  Duration of Psychotic Symptoms:Less than six months  Hallucinations:Hallucinations: None  Ideas of Reference:None  Suicidal Thoughts:Suicidal Thoughts: Yes, Passive SI Passive Intent and/or Plan: With Intent; Without Plan; Without Means to Carry Out  Homicidal Thoughts:Homicidal Thoughts:  No   Sensorium  Memory:Immediate Fair; Recent Fair; Remote Fair  Judgment:Poor  Insight:Poor   Executive Functions  Concentration:Poor  Attention Span:Poor  Recall:Fair  Fund of Knowledge:Fair  Language:Fair   Psychomotor Activity  Psychomotor Activity:Psychomotor Activity: Normal   Assets  Assets:Communication Skills; Desire for Improvement; Housing; Leisure Time; Physical Health; Resilience; Social Support   Sleep  Sleep:Sleep: Fair   Physical Exam: Physical Exam Vitals and nursing note reviewed.  Constitutional:      Appearance: He is ill-appearing.  HENT:     Head: Normocephalic and atraumatic.     Right Ear: External ear normal.     Left Ear: External ear normal.     Nose: Nose normal.     Mouth/Throat:     Mouth: Mucous membranes are dry.  Cardiovascular:     Rate and Rhythm: Normal rate.     Pulses: Normal pulses.  Pulmonary:     Effort: Pulmonary effort is normal.  Musculoskeletal:        General: Normal range of motion.     Cervical back: Normal range of motion and neck supple.  Neurological:     General: No focal deficit present.     Mental Status: He is alert and oriented to person, place, and time.  Psychiatric:        Attention and Perception: He perceives auditory hallucinations.        Mood and Affect: Mood is anxious and depressed. Affect is labile and inappropriate.        Speech: Speech is delayed and slurred.        Behavior: Behavior is aggressive and withdrawn. Behavior is cooperative.        Thought Content: Thought content is paranoid and delusional.        Cognition and Memory: Memory normal. Cognition is impaired.        Judgment: Judgment is impulsive and inappropriate.    Review of Systems  Psychiatric/Behavioral:  Positive for depression and substance abuse. The patient is nervous/anxious.    Blood pressure 101/89, pulse 85, temperature 98.1 F (36.7 C), resp. rate 20, SpO2 98 %. There is no height or weight on file  to calculate BMI.  Treatment Plan Summary: The patient remained under observation overnight and will be reassessed in the a.m. to determine if he meets the criteria for psychiatric inpatient admission; he could be discharged home.  Disposition: Supportive therapy provided about ongoing stressors. The patient remained under observation overnight and will be reassessed in the a.m. to determine if he meets the criteria for psychiatric inpatient admission; he could be discharged home.  Gillermo Murdoch, NP 06/18/2022 11:18 PM

## 2022-06-18 NOTE — ED Triage Notes (Addendum)
Pt presents via [facility] due to alcohol intoxication. Pt reports he had 6 shots of liquor today. Pt reports he would like to stop drinking. Pt endorses SI due to living situation & being homeless

## 2022-06-18 NOTE — Consult Note (Incomplete)
[facility] Face-to-Face Psychiatry Consult   Reason for Consult: Alcohol Intoxication Referring Physician: Dr. Roxan Hockey Patient Identification: Gary [name] MRN:  [id number] Principal Diagnosis: <principal problem not specified> Diagnosis:  Active Problems:   HIV disease (HCC)   Cigarette smoker   Alcohol use disorder, severe, dependence (HCC)   Major depressive disorder, recurrent severe without psychotic features (HCC)   Alcohol-induced mood disorder (HCC)   Traumatic subdural hematoma (HCC)   Alcohol intoxication with delirium (HCC)   Malnutrition of moderate degree   Substance induced mood disorder (HCC)   Alcohol abuse   Amphetamine abuse (HCC)   MDD (major depressive disorder), recurrent episode, severe (HCC)   Alcohol-induced insomnia (HCC)   History of seizures   Homelessness   Alcohol withdrawal (HCC)   Total Time spent with patient: 1 hour  Subjective: "I need help with the drinking."  Gary [name] is a 36 y.o. male patient presented to [facility] ED via [facility] due to alcohol intoxication. The patient share that he drinks everyday. He discussed that he was going to drink himself to death. The patient BAL is 339 mg/dl  The patient was seen face-to-face by this provider; chart reviewed and consulted with Dr. Roxan Hockey on 06/18/2022 due to the care of the patient. It was discussed with the EDP  that the patient remained under observation overnight and will be reassessed in the a.m. to determine if he meets the criteria for psychiatric inpatient admission; he could be discharged home.  On evaluation the patient is alert and oriented x 4, very intoxicated, anxious  but cooperative, and mood-congruent with affect.  The patient does appear to be responding to external stimuli then internal stimuli. The patient is presenting with some delusional thinking. The patient denies auditory hallucinations but denies visual hallucinations.. The patient admits to suicidal ideation, but denies  homicidal, or self-harm ideations. The patient is presenting with any psychotic or paranoid behaviors.   HPI:   Past Psychiatric History:  Alcohol abuse Anxiety Bipolar 2 disorder (HCC) PTSD (post-traumatic stress disorder) Schizophrenia (HCC) Seizures (HCC)   Risk to Self:   Risk to Others:   Prior Inpatient Therapy:   Prior Outpatient Therapy:    Past Medical History:  Past Medical History:  Diagnosis Date  . Alcohol abuse   . 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  . Anxiety   . Bipolar 2 disorder (HCC)   . HIV (human immunodeficiency virus infection) (HCC)   . PTSD (post-traumatic stress disorder)   . Schizophrenia (HCC)   . Seizures (HCC)   . Subdural hematoma [facility])     Past Surgical History:  Procedure Laterality Date  . BIOPSY  04/19/2022   Procedure: BIOPSY;  Surgeon: Meridee Score Netty Starring., MD;  Location: [facility] ENDOSCOPY;  Service: Gastroenterology;;  . ENTEROSCOPY N/A 04/19/2022   Procedure: ENTEROSCOPY;  Surgeon: Meridee Score Netty Starring., MD;  Location: [facility] ENDOSCOPY;  Service: Gastroenterology;  Laterality: N/A;  . INCISION AND DRAINAGE PERIRECTAL ABSCESS N/A 09/24/2016   Procedure: IRRIGATION AND DEBRIDEMENT PERIRECTAL ABSCESS;  Surgeon: Ricarda Frame, MD;  Location: [facility] ORS;  Service: General;  Laterality: N/A;  . none     Family History:  Family History  Problem Relation Age of Onset  . Alcohol abuse Mother   . Alcohol abuse Father   . Colon cancer Other   . Other Other   . Cancer Other    Family Psychiatric  History:  Social History:  Social History   Substance and Sexual Activity  Alcohol Use Yes  . Alcohol/week: 105.0 standard  drinks of alcohol  . Types: 105 Cans of beer per week   Comment: 15 beers/day (105 beers/week), 1 fifth of liquor per day (7/week)     Social History   Substance and Sexual Activity  Drug Use Not Currently  . Types: Methamphetamines, Marijuana   Comment: last used 04/10/2019    Social History   Socioeconomic  History  . Marital status: Single    Spouse name: Not on file  . Number of children: Not on file  . Years of education: Not on file  . Highest education level: Not on file  Occupational History  . Occupation: unemployed  Tobacco Use  . Smoking status: Every Day    Packs/day: 0.50    Types: Cigarettes  . Smokeless tobacco: Never  . Tobacco comments:    unable to smoke while incarcerated 6+ months 02/13/20  Vaping Use  . Vaping Use: Never used  Substance and Sexual Activity  . Alcohol use: Yes    Alcohol/week: 105.0 standard drinks of alcohol    Types: 105 Cans of beer per week    Comment: 15 beers/day (105 beers/week), 1 fifth of liquor per day (7/week)  . Drug use: Not Currently    Types: Methamphetamines, Marijuana    Comment: last used 04/10/2019  . Sexual activity: Yes    Partners: Male, Male    Comment: declined condoms  03/2021  Other Topics Concern  . Not on file  Social History Narrative   "Currently living on the streets"   Independent at baseline   Occasionally goes to the [facility]   Social Determinants of Health   Financial Resource Strain: Not on file  Food Insecurity: Food Insecurity Present (04/27/2022)   Hunger Vital Sign   . Worried About Programme researcher, broadcasting/film/video in the Last Year: Sometimes true   . Ran Out of Food in the Last Year: Sometimes true  Transportation Needs: Unmet Transportation Needs (04/27/2022)   PRAPARE - Transportation   . Lack of Transportation (Medical): Yes   . Lack of Transportation (Non-Medical): Yes  Physical Activity: Not on file  Stress: Not on file  Social Connections: Not on file   Additional Social History:    Allergies:   Allergies  Allergen Reactions  . Tegretol [Carbamazepine] Other (See Comments)    Caused vertigo for 2 days after taking it  . Caffeine Palpitations    Labs:  Results for orders placed or performed during the hospital encounter of 06/18/22 (from the past 48 hour(s))  Comprehensive metabolic panel     Status:  Abnormal   Collection Time: 06/18/22  8:16 PM  Result Value Ref Range   Sodium 143 135 - 145 mmol/L   Potassium 3.8 3.5 - 5.1 mmol/L   Chloride 109 98 - 111 mmol/L   CO2 21 (L) 22 - 32 mmol/L   Glucose, Bld 91 70 - 99 mg/dL    Comment: Glucose reference range applies only to samples taken after fasting for at least 8 hours.   BUN 12 6 - 20 mg/dL   Creatinine, Ser 3.70 0.61 - 1.24 mg/dL   Calcium 9.3 8.9 - 48.8 mg/dL   Total Protein 7.4 6.5 - 8.1 g/dL   Albumin 4.0 3.5 - 5.0 g/dL   AST 34 15 - 41 U/L   ALT 16 0 - 44 U/L   Alkaline Phosphatase 52 38 - 126 U/L   Total Bilirubin 0.7 0.3 - 1.2 mg/dL   GFR, Estimated >89 >16 mL/min    Comment: (  NOTE) Calculated using the CKD-EPI Creatinine Equation (2021)    Anion gap 13 5 - 15    Comment: Performed at [facility], [address] [place], [place] [address]  Ethanol     Status: Abnormal   Collection Time: 06/18/22  8:16 PM  Result Value Ref Range   Alcohol, Ethyl (B) 339 (HH) <10 mg/dL    Comment: CRITICAL RESULT CALLED TO, READ BACK BY AND VERIFIED WITH AMY COHEN 06/18/22 2105 [facility] (NOTE) Lowest detectable limit for serum alcohol is 10 mg/dL.  For medical purposes only. Performed at [facility], [address] [place], [place] [address]   cbc     Status: None   Collection Time: 06/18/22  8:16 PM  Result Value Ref Range   WBC 4.5 4.0 - 10.5 K/uL   RBC 4.39 4.22 - 5.81 MIL/uL   Hemoglobin 14.5 13.0 - 17.0 g/dL   HCT 23.5 36.1 - 44.3 %   MCV 96.1 80.0 - 100.0 fL   MCH 33.0 26.0 - 34.0 pg   MCHC 34.4 30.0 - 36.0 g/dL   RDW 15.4 00.8 - 67.6 %   Platelets 162 150 - 400 K/uL   nRBC 0.0 0.0 - 0.2 %    Comment: Performed at [facility], [address] [place], [place] [address]    No current facility-administered medications for this encounter.   Current Outpatient Medications  Medication Sig Dispense Refill  . bictegravir-emtricitabine-tenofovir AF (BIKTARVY) 50-200-25 MG TABS tablet Take  1 tablet by mouth daily. 30 tablet 0  . gabapentin (NEURONTIN) 300 MG capsule Take 1 capsule (300 mg total) by mouth 3 (three) times daily. (Patient taking differently: Take 900 mg by mouth 3 (three) times daily as needed (nerve pain).) 90 capsule 3  . levETIRAcetam (KEPPRA) 500 MG tablet Take 1 tablet (500 mg total) by mouth 2 (two) times daily. 60 tablet 3  . Multiple Vitamin (MULTIVITAMIN WITH MINERALS) TABS tablet Take 1 tablet by mouth daily. 30 tablet 3  . thiamine 100 MG tablet Take 1 tablet (100 mg total) by mouth daily. (Patient taking differently: Take 100 mg by mouth in the morning and at bedtime.) 30 tablet 3  . chlordiazePOXIDE (LIBRIUM) 25 MG capsule 50mg  PO TID x 1D, then 25-50mg  PO BID X 1D, then 25-50mg  PO QD X 1D (Patient not taking: Reported on 06/18/2022) 10 capsule 0  . folic acid (FOLVITE) 1 MG tablet Take 1 tablet (1 mg total) by mouth daily. (Patient not taking: Reported on 06/18/2022) 30 tablet 3  . pantoprazole (PROTONIX) 40 MG tablet Take 1 tablet (40 mg total) by mouth 2 (two) times daily. (Patient not taking: Reported on 05/21/2022) 60 tablet 3    Musculoskeletal: Strength & Muscle Tone: within normal limits Gait & Station: normal Patient leans: N/A Psychiatric Specialty Exam:  Presentation  General Appearance: Disheveled  Eye Contact:Good  Speech:Garbled; Slurred  Speech Volume:Decreased  Handedness:Right   Mood and Affect  Mood:Euthymic  Affect:Blunt; Congruent; Depressed; Inappropriate   Thought Process  Thought Processes:Coherent  Descriptions of Associations:Intact  Orientation:Full (Time, Place and Person)  Thought Content:Logical  History of Schizophrenia/Schizoaffective disorder:No  Duration of Psychotic Symptoms:Less than six months  Hallucinations:Hallucinations: None  Ideas of Reference:None  Suicidal Thoughts:Suicidal Thoughts: Yes, Passive SI Passive Intent and/or Plan: With Intent; Without Plan; Without Means to Carry  Out  Homicidal Thoughts:Homicidal Thoughts: No   Sensorium  Memory:Immediate Fair; Recent Fair; Remote Fair  Judgment:Poor  Insight:Poor   Executive Functions  Concentration:Poor  Attention Span:Poor  Recall:Fair  Fund of Knowledge:Fair  Language:Fair   Psychomotor Activity  Psychomotor Activity:Psychomotor Activity: Normal   Assets  Assets:Communication Skills; Desire for Improvement; Housing; Leisure Time; Physical Health; Resilience; Social Support   Sleep  Sleep:Sleep: Fair   Physical Exam: Physical Exam Vitals and nursing note reviewed.  Constitutional:      Appearance: He is ill-appearing.  HENT:     Head: Normocephalic and atraumatic.     Right Ear: External ear normal.     Left Ear: External ear normal.     Nose: Nose normal.     Mouth/Throat:     Mouth: Mucous membranes are dry.  Cardiovascular:     Rate and Rhythm: Normal rate.     Pulses: Normal pulses.  Pulmonary:     Effort: Pulmonary effort is normal.  Musculoskeletal:        General: Normal range of motion.     Cervical back: Normal range of motion and neck supple.  Neurological:     General: No focal deficit present.     Mental Status: He is alert and oriented to person, place, and time.  Psychiatric:        Attention and Perception: He perceives auditory hallucinations.        Mood and Affect: Mood is anxious and depressed. Affect is labile and inappropriate.        Speech: Speech is delayed and slurred.        Behavior: Behavior is aggressive and withdrawn. Behavior is cooperative.        Thought Content: Thought content is paranoid and delusional.        Cognition and Memory: Memory normal. Cognition is impaired.        Judgment: Judgment is impulsive and inappropriate.    Review of Systems  Psychiatric/Behavioral:  Positive for depression and substance abuse. The patient is nervous/anxious.    Blood pressure 101/89, pulse 85, temperature 98.1 F (36.7 C), resp. rate 20,  SpO2 98 %. There is no height or weight on file to calculate BMI.  Treatment Plan Summary: The patient remained under observation overnight and will be reassessed in the a.m. to determine if he meets the criteria for psychiatric inpatient admission; he could be discharged home.  Disposition: Supportive therapy provided about ongoing stressors. The patient remained under observation overnight and will be reassessed in the a.m. to determine if he meets the criteria for psychiatric inpatient admission; he could be discharged home.  Gillermo Murdoch, NP 06/18/2022 11:18 PM

## 2022-06-18 NOTE — BH Assessment (Signed)
Comprehensive Clinical Assessment (CCA) Note  06/18/2022 Gary [name] [id number] Gary [name]. [name] is a 36 year old., Caucasian, Non-Hispanic, English speaking male with a psych hx of substance induced mood disorder and MDD recurrent, severe. Pt also has a hx of polysubstance abuse. Pt is voluntary. Per triage note: Pt presents via [facility] due to alcohol intoxication. Pt reports he had 6 shots of liquor today. Pt reports he would like to stop drinking. Pt endorses SI due to living situation & being homeless Upon assessment, Pt endorsed symptoms of worsening symptoms of anxiety and depression, secondary to losing a friend who was murdered 5 days ago. Pt reported that he has thoughts of SI with a plan to drink himself to death. Pt reported that if he goes back out he will die due to feeling like the alcohol is killing him. Pt denied any other substance use. Of note, pt made repetitive, jerking movements. Pt requested medications due to not being connected to services. Pt reported that he is unemployed and homeless. Pt had adequate insight and adequate reality testing. Pt presented with relevant thoughts and coherent speech. Pt had good eye contact and restless psychomotor activity. Pt had a depressed mood and an anxious affect. Pt did not have any delusional thoughts or paranoia. Pt denied current passive SI/HI. Pt's BAL is 339; UDS is pending.   Chief Complaint:  Chief Complaint  Patient presents with   Alcohol Intoxication   Visit Diagnosis: Alcohol use disorder, severe dependence    CCA Screening, Triage and Referral (STR)  Patient Reported Information How did you hear about [place]? Self  Referral name: No data recorded Referral phone number: No data recorded  Whom do you see for routine medical problems? No data recorded Practice/Facility Name: No data recorded Practice/Facility Phone Number: No data recorded Name of Contact: No data recorded Contact Number: No data recorded Contact Fax  Number: No data recorded Prescriber Name: No data recorded Prescriber Address (if known): No data recorded  What Is the Reason for Your Visit/Call Today? Pt presents via [facility] due to alcohol intoxication. Pt reports he had 6 shots of liquor today. Pt reports he would like to stop drinking. Pt endorses SI due to living situation & being homeless  How Long Has This Been Causing You Problems? > than 6 months  What Do You Feel Would Help You the Most Today? Alcohol or Drug Use Treatment; Stress Management; Treatment for Depression or other mood problem   Have You Recently Been in Any Inpatient Treatment (Hospital/Detox/Crisis Center/28-Day Program)? No data recorded Name/Location of Program/Hospital:No data recorded How Long Were You There? No data recorded When Were You Discharged? No data recorded  Have You Ever Received Services From [facility] Before? No data recorded Who Do You See at [facility]? No data recorded  Have You Recently Had Any Thoughts About Hurting Yourself? Yes  Are You Planning to Commit Suicide/Harm Yourself At This time? Yes   Have you Recently Had Thoughts About Hurting Someone [name]? Yes  Explanation: No data recorded  Have You Used Any Alcohol or Drugs in the Past 24 Hours? Yes  How Long Ago Did You Use Drugs or Alcohol? No data recorded What Did You Use and How Much? Pt reports he had 6 shots of liquor today.   Do You Currently Have a Therapist/Psychiatrist? No  Name of Therapist/Psychiatrist: No psychiatric providers at this time.   Have You Been Recently Discharged From Any Office Practice or Programs? No  Explanation of Discharge From  Practice/Program: No data recorded    CCA Screening Triage Referral Assessment Type of Contact: Face-to-Face  Is this Initial or Reassessment? Initial Assessment  Date Telepsych consult ordered in [facility]:  03/12/22  Time Telepsych consult ordered in [facility]:  2252   Patient Reported Information Reviewed? No data  recorded Patient Left Without Being Seen? No data recorded Reason for Not Completing Assessment: No data recorded  Collateral Involvement: Pt reports, he has no way to get in contact with his family.   Does Patient Have a Automotive engineer Guardian? No data recorded Name and Contact of Legal Guardian: No data recorded If Minor and Not Living with Parent(s), Who has Custody? n/a  Is CPS involved or ever been involved? Never  Is APS involved or ever been involved? Never   Patient Determined To Be At Risk for Harm To Self or Others Based on Review of Patient Reported Information or Presenting Complaint? No  Method: No data recorded Availability of Means: No data recorded Intent: No data recorded Notification Required: No data recorded Additional Information for Danger to Others Potential: No data recorded Additional Comments for Danger to Others Potential: No data recorded Are There Guns or Other Weapons in Your Home? No data recorded Types of Guns/Weapons: No data recorded Are These Weapons Safely Secured?                            No data recorded Who Could Verify You Are Able To Have These Secured: No data recorded Do You Have any Outstanding Charges, Pending Court Dates, Parole/Probation? No data recorded Contacted To Inform of Risk of Harm To Self or Others: Other: Comment; Unable to Contact:   [facility] of Assessment[facility]   Does Patient Present under Involuntary Commitment? No  IVC Papers Initial File Date: 09/23/21   [place] of Residence: Gary [name]   Patient Currently Receiving the Following Services: Not Receiving Services   Determination of Need: Emergent (2 hours)   Options For Referral: ED Visit     CCA Biopsychosocial Intake/Chief Complaint:  No data recorded Current Symptoms/Problems: No data recorded  Patient Reported Schizophrenia/Schizoaffective Diagnosis in Past: No   Strengths: Pt is able to identify needs and ask for help; pt is open  to treatment  Preferences: No data recorded Abilities: No data recorded  Type of Services Patient Feels are Needed: No data recorded  Initial Clinical Notes/Concerns: No data recorded  Mental Health Symptoms Depression:   Difficulty Concentrating; Fatigue; Increase/decrease in appetite; Irritability; Sleep (too much or little); Tearfulness; Hopelessness   Duration of Depressive symptoms:  Greater than two weeks   Mania:   None   Anxiety:    Fatigue; Sleep; Tension; Worrying   Psychosis:   None   Duration of Psychotic symptoms:  Less than six months   Trauma:   None   Obsessions:   Attempts to suppress/neutralize; Cause anxiety; Disrupts routine/functioning; Intrusive/time consuming; Recurrent & persistent thoughts/impulses/images; Good insight   Compulsions:   Disrupts with routine/functioning; "Driven" to perform behaviors/acts; Good insight; Intended to reduce stress or prevent another outcome; Intrusive/time consuming; Repeated behaviors/mental acts   Inattention:   None   Hyperactivity/Impulsivity:   None   Oppositional/Defiant Behaviors:   Angry; Resentful   Emotional Irregularity:   Recurrent suicidal behaviors/gestures/threats; Intense/inappropriate anger   Other Mood/Personality Symptoms:   Pt reported that he is grieving the loss of a close friend who was murdered 5 days ago.    Mental Status  Exam Appearance and self-care  Stature:   Tall   Weight:   Thin   Clothing:   Disheveled   Grooming:   Neglected   Cosmetic use:   None   Posture/gait:   Tense   Motor activity:   Restless   Sensorium  Attention:   Normal   Concentration:   Anxiety interferes   Orientation:   Person; Place; Situation; Object   Recall/memory:   Normal   Affect and Mood  Affect:   Anxious   Mood:   Depressed   Relating  Eye contact:   None   Facial expression:   Sad   Attitude toward examiner:   Cooperative   Thought and Language   Speech flow:  Normal   Thought content:   Appropriate to Mood and Circumstances   Preoccupation:   Ruminations   Hallucinations:   None   Organization:  No data recorded  [employer] of Knowledge:   Fair   Intelligence:   Average   Abstraction:   Normal   Judgement:   Impaired; Dangerous   Reality Testing:   Adequate   Insight:   Fair   Decision Making:   Impulsive   Social Functioning  Social Maturity:   Isolates   Social Judgement:   Impropriety   Stress  Stressors:   Grief/losses; Housing   Coping Ability:   Deficient supports; Overwhelmed; Exhausted   Skill Deficits:   Responsibility; Self-control   Supports:   Support needed     Religion: Religion/Spirituality Are You A Religious Person?: Yes What is Your Religious Affiliation?: Christian How Might This Affect Treatment?: NA  Leisure/Recreation: Leisure / Recreation Do You Have Hobbies?: Yes Leisure and Hobbies: Video games  Exercise/Diet: Exercise/Diet Do You Exercise?: No Have You Gained or Lost A Significant Amount of Weight in the Past Six Months?: No Do You Follow a Special Diet?: No Do You Have Any Trouble Sleeping?: Yes Explanation of Sleeping Difficulties: Pt reports, difficulty sleeping being homeless and sleeping outside.   CCA Employment/Education Employment/Work Situation: Employment / Work Situation Employment Situation: Unemployed Patient's Job has Been Impacted by Current Illness: Yes Describe how Patient's Job has Been Impacted: Per chart, "unable to maintain employment." Has Patient ever Been in the [employer]?: No  Education: Education Is Patient Currently Attending School?: No Last Grade Completed: 12 Did You Product manager?: No Did You Have An Individualized Education Program (IIEP): No Did You Have Any Difficulty At School?: No Patient's Education Has Been Impacted by Current Illness: No   CCA Family/Childhood History Family and  Relationship History: Family history Marital status: Single Does patient have children?: No  Childhood History:  Childhood History By whom was/is the patient raised?: Both parents Did patient suffer any verbal/emotional/physical/sexual abuse as a child?: No Did patient suffer from severe childhood neglect?: No Has patient ever been sexually abused/assaulted/raped as an adolescent or adult?: No Was the patient ever a victim of a crime or a disaster?: No Witnessed domestic violence?: No Has patient been affected by domestic violence as an adult?: No  Child/Adolescent Assessment:     CCA Substance Use Alcohol/Drug Use: Alcohol / Drug Use Pain Medications: See MAR Prescriptions: See MAR Over the Counter: See MAR History of alcohol / drug use?: Yes Longest period of sobriety (when/how long): Per chart. 2 years Negative Consequences of Use: Financial, Legal, Personal relationships, Work / School Withdrawal Symptoms: Irritability, Nausea / Vomiting, Patient aware of relationship between substance abuse and physical/medical complications, Sweats, Diarrhea,  Fever / Chills                         ASAM's:  Six Dimensions of Multidimensional Assessment  Dimension 1:  Acute Intoxication and/or Withdrawal Potential:   Dimension 1:  Description of individual's past and current experiences of substance use and withdrawal: Patient has a history of withdrawal seizures. Pt also reports, he's hot and sweating, diarrhea, irritable/angry.  Dimension 2:  Biomedical Conditions and Complications:   Dimension 2:  Description of patient's biomedical conditions and  complications: Patient has several medical issues negatively impacted by his use of alcohol.  Dimension 3:  Emotional, Behavioral, or Cognitive Conditions and Complications:  Dimension 3:  Description of emotional, behavioral, or cognitive conditions and complications: Pt attempted suicide twice today (08/25/2021) he tried drinking  himself to death then "played" in traffic.  Dimension 4:  Readiness to Change:  Dimension 4:  Description of Readiness to Change criteria: Pt is wanting to detox, to start treatment and be around positive people.  Dimension 5:  Relapse, Continued use, or Continued Problem Potential:  Dimension 5:  Relapse, continued use, or continued problem potential critiera description: Patient has a history of chronic relapses.  Dimension 6:  Recovery/Living Environment:  Dimension 6:  Recovery/Iiving environment criteria description: Homeless on and off for years.  ASAM Severity Score: ASAM's Severity Rating Score: 15  ASAM Recommended Level of Treatment: ASAM Recommended Level of Treatment: Level III Residential Treatment   Substance use Disorder (SUD) Substance Use Disorder (SUD)  Checklist Symptoms of Substance Use: Continued use despite having a persistent/recurrent physical/psychological problem caused/exacerbated by use, Continued use despite persistent or recurrent social, interpersonal problems, caused or exacerbated by use, Evidence of tolerance, Evidence of withdrawal (Comment), Large amounts of time spent to obtain, use or recover from the substance(s), Persistent desire or unsuccessful efforts to cut down or control use, Presence of craving or strong urge to use, Recurrent use that results in a failure to fulfill major role obligations (work, school, home), Repeated use in physically hazardous situations, Social, occupational, recreational activities given up or reduced due to use, Substance(s) often taken in larger amounts or over longer times than was intended  Recommendations for Services/Supports/Treatments: Recommendations for Services/Supports/Treatments Recommendations For Services/Supports/Treatments: Residential-Level 3, Inpatient Hospitalization, Individual Therapy, Detox, Medication Management  DSM5 Diagnoses: Patient Active Problem List   Diagnosis Date Noted   Alcohol withdrawal (HCC)  05/29/2022   AKI (acute kidney injury) (HCC) 05/28/2022   Homelessness 05/28/2022   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   Metabolic acidosis with increased anion gap and accumulation of organic acids 04/16/2022   History of seizures 04/16/2022   Tobacco abuse 04/16/2022   Macrocytosis 12/31/2021   Alcohol-induced insomnia (HCC)    MDD (major depressive disorder), recurrent episode, 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/26/2021   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   Alcohol abuse    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   Malnutrition of moderate degree 05/07/2020   Alcoholic ketoacidosis 05/05/2020   Thrombocytopenia (HCC) 07/25/2019   Transaminitis 07/25/2019   Traumatic subdural hematoma (HCC) 07/02/2019   Alcohol intoxication with delirium (HCC) 07/02/2019   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   Sriansh Farra R Cadynce Garrette, LCAS

## 2022-06-19 DIAGNOSIS — F1010 Alcohol abuse, uncomplicated: Secondary | ICD-10-CM | POA: Diagnosis not present

## 2022-06-19 LAB — URINE DRUG SCREEN, QUALITATIVE (ARMC ONLY)
Amphetamines, Ur Screen: NOT DETECTED
Barbiturates, Ur Screen: NOT DETECTED
Benzodiazepine, Ur Scrn: POSITIVE — AB
Cannabinoid 50 Ng, Ur ~~LOC~~: NOT DETECTED
Cocaine Metabolite,Ur ~~LOC~~: NOT DETECTED
MDMA (Ecstasy)Ur Screen: NOT DETECTED
Methadone Scn, Ur: NOT DETECTED
Opiate, Ur Screen: NOT DETECTED
Phencyclidine (PCP) Ur S: NOT DETECTED
Tricyclic, Ur Screen: NOT DETECTED

## 2022-06-19 LAB — RESP PANEL BY RT-PCR (FLU A&B, COVID) ARPGX2
Influenza A by PCR: NEGATIVE
Influenza B by PCR: NEGATIVE
SARS Coronavirus 2 by RT PCR: NEGATIVE

## 2022-06-19 MED ORDER — LORAZEPAM 2 MG/ML IJ SOLN
1.0000 mg | Freq: Once | INTRAMUSCULAR | Status: DC
  Filled 2022-06-19: qty 1

## 2022-06-19 MED ORDER — LORAZEPAM 2 MG/ML IJ SOLN
1.0000 mg | Freq: Once | INTRAMUSCULAR | Status: AC
  Administered 2022-06-19: 1 mg via INTRAVENOUS

## 2022-06-19 NOTE — ED Notes (Signed)
Patient resting in bed free from sign of distress. Breathing unlabored  with symmetric chest rise and fall. Bed low and locked with side rails raised x2. Pt in view of nurses station   

## 2022-06-19 NOTE — ED Notes (Signed)
Unable to give report at this time.

## 2022-06-19 NOTE — ED Provider Notes (Signed)
[facility] Provider Note    Event Date/Time   First MD Initiated Contact with Patient 06/18/22 2028     (approximate)   History   Alcohol Intoxication   HPI  Gary [name] is a 36 y.o. male history of significant alcohol abuse schizophrenia presents to the ER for evaluation of alcohol abuse homelessness and seeking shelter.  States that he feels very depressed and wants to harm himself but does not have a plan.  States he would only harm himself if he were discharged.  Denies any injury.  Denies any other substance use.     Physical Exam   Triage Vital Signs: ED Triage Vitals  Enc Vitals Group     BP 06/18/22 2011 101/89     Pulse Rate 06/18/22 2011 85     Resp 06/18/22 2011 20     Temp 06/18/22 2011 98.1 F (36.7 C)     Temp src --      SpO2 06/18/22 2011 98 %     Weight --      Height --      Head Circumference --      Peak Flow --      Pain Score 06/18/22 2020 0     Pain Loc --      Pain Edu? --      Excl. in GC? --     Most recent vital signs: Vitals:   06/18/22 2011  BP: 101/89  Pulse: 85  Resp: 20  Temp: 98.1 F (36.7 C)  SpO2: 98%     Constitutional: Alert, smells heavily of etoh Eyes: Conjunctivae are normal.  Head: Atraumatic. Nose: No congestion/rhinnorhea. Mouth/Throat: Mucous membranes are moist.   Neck: Painless ROM.  Cardiovascular:   Good peripheral circulation. Respiratory: Normal respiratory effort.  No retractions.  Gastrointestinal: Soft and nontender.  Musculoskeletal:  no deformity Neurologic:  MAE spontaneously. No gross focal neurologic deficits are appreciated.  Skin:  Skin is warm, dry and intact. No rash noted. Psychiatric: calm.    ED Results / Procedures / Treatments   Labs (all labs ordered are listed, but only abnormal results are displayed) Labs Reviewed  COMPREHENSIVE METABOLIC PANEL - Abnormal; Notable for the following components:      Result Value   CO2 21 (*)    All other  components within normal limits  ETHANOL - Abnormal; Notable for the following components:   Alcohol, Ethyl (B) 339 (*)    All other components within normal limits  RESP PANEL BY RT-PCR (FLU A&B, COVID) ARPGX2  CBC  URINE DRUG SCREEN, QUALITATIVE ([facility] ONLY)     EKG     RADIOLOGY    PROCEDURES:  Critical Care performed:   Procedures   MEDICATIONS ORDERED IN ED: Medications  bictegravir-emtricitabine-tenofovir AF (BIKTARVY) 50-200-25 MG per tablet 1 tablet (has no administration in time range)  levETIRAcetam (KEPPRA) tablet 500 mg (has no administration in time range)  LORazepam (ATIVAN) tablet 1-4 mg (has no administration in time range)    Or  LORazepam (ATIVAN) injection 1-4 mg (has no administration in time range)  thiamine (VITAMIN B1) tablet 100 mg (has no administration in time range)    Or  thiamine (VITAMIN B1) injection 100 mg (has no administration in time range)  folic acid (FOLVITE) tablet 1 mg (has no administration in time range)  multivitamin with minerals tablet 1 tablet (has no administration in time range)     IMPRESSION / MDM / ASSESSMENT AND PLAN /  ED COURSE  I reviewed the triage vital signs and the nursing notes.                              Differential diagnosis includes, but is not limited to, Psychosis, delirium, medication effect, noncompliance, polysubstance abuse, Si, Hi, depression  Patient presenting intoxicated requesting help with substance abuse as well as psych consultation.  He does not have any SI.  He is clinically intoxicated no signs of seizure.  I do believe that he is seeking secondary gain in the setting of his homelessness and trying to obtain shelter at this time.  As he is intoxicated will observe.  Does not have any other findings or complaints to indicate additional diagnostic workup at this time.       FINAL CLINICAL IMPRESSION(S) / ED DIAGNOSES   Final diagnoses:  Alcoholic intoxication without complication  (HCC)     Rx / DC Orders   ED Discharge Orders     None        Note:  This document was prepared using Dragon voice recognition software and may include unintentional dictation errors.    Willy Eddy, MD 06/19/22 [phone]

## 2022-06-19 NOTE — ED Notes (Addendum)
Patient resting in bed free from sign of distress. Breathing unlabored  with symmetric chest rise and fall. Bed low and locked with side rails raised x2. Pt in view of nurses station   

## 2022-06-19 NOTE — BH Assessment (Signed)
Referral information for Psychiatric Hospitalization faxed to;    [facility] [phone])  [facility] [phone])  [facility] [phone]) [phone] crisis center/detox facility) (Fax#-[phone]-or-[phone])  [facility] [phone] or [phone])  [facility] [phone]- [phone]),   [facility] [phone]).  [facility] [phone])

## 2022-06-19 NOTE — ED Provider Notes (Signed)
-----------------------------------------   7:30 AM on 06/19/2022 ----------------------------------------- Patient is beginning to get a little bit shaky.  He feels like he is having some alcohol withdrawal.  Will give him some Ativan.  Ordered 1 mg IV followed by a second milligram if needed.   Arnaldo Natal, MD 06/19/22 0730

## 2022-06-19 NOTE — Consult Note (Signed)
[facility] Consult   Reason for Consult: Consult for 36 year old man with a history of alcohol abuse came to the emergency room requesting help with drinking Referring Physician: Cinda Quest Patient Identification: BRYANT DOVE MRN:  [id number] Principal Diagnosis: Alcohol abuse Diagnosis:  Principal Problem:   Alcohol abuse Active Problems:   HIV disease ([place])   Cigarette smoker   Alcohol use disorder, severe, dependence ([place])   Major depressive disorder, recurrent severe without psychotic features ([place])   Alcohol-induced mood disorder (HCC)   Traumatic subdural hematoma (HCC)   Alcohol intoxication with delirium ([place])   Malnutrition of moderate degree   Substance induced mood disorder (HCC)   Amphetamine abuse (HCC)   MDD (major depressive disorder), recurrent episode, severe ([place])   Alcohol-induced insomnia ([place])   History of seizures   Homelessness   Alcohol withdrawal ([place])   Total Time spent with patient: 45 minutes  Subjective:   Gary [name] is a 36 y.o. male patient admitted with "I need help with drinking".  HPI: Patient seen and chart reviewed.  Patient known from previous encounters.  36 year old man with a long history of alcohol abuse reports that he had relapsed recently and has been drinking probably as much as a quart of vodka per day.  Blood alcohol level on presentation was almost 400.  He says he has been homeless and living outdoors or going 1 place to another.  Mood feels bad and anxious but he denies any suicidal ideation or wish.  Denies any auditory hallucinations.  Seeing spots today but no psychotic symptoms.  Physically feeling shaky.  Past Psychiatric History: Long history of alcohol abuse.  Has had inpatient detoxes before and some intermittent periods of sobriety.  Risk to Self:   Risk to Others:   Prior Inpatient Therapy:   Prior Outpatient Therapy:    Past Medical History:  Past Medical History:  Diagnosis Date   Alcohol  abuse    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   Anxiety    Bipolar 2 disorder (HCC)    HIV (human immunodeficiency virus infection) ([place])    PTSD (post-traumatic stress disorder)    Schizophrenia ([place])    Seizures ([place])    Subdural hematoma (HCC)     Past Surgical History:  Procedure Laterality Date   BIOPSY  04/19/2022   Procedure: BIOPSY;  Surgeon: Irving Copas., MD;  Location: [facility] ENDOSCOPY;  Service: Gastroenterology;;   ENTEROSCOPY N/A 04/19/2022   Procedure: ENTEROSCOPY;  Surgeon: Irving Copas., MD;  Location: [place];  Service: Gastroenterology;  Laterality: N/A;   INCISION AND DRAINAGE PERIRECTAL ABSCESS N/A 09/24/2016   Procedure: IRRIGATION AND DEBRIDEMENT PERIRECTAL ABSCESS;  Surgeon: Clayburn Pert, MD;  Location: [facility] ORS;  Service: General;  Laterality: N/A;   none     Family History:  Family History  Problem Relation Age of Onset   Alcohol abuse Mother    Alcohol abuse Father    Colon cancer Other    Other Other    Cancer Other    Family Psychiatric  History: See previous Social History:  Social History   Substance and Sexual Activity  Alcohol Use Yes   Alcohol/week: 105.0 standard drinks of alcohol   Types: 105 Cans of beer per week   Comment: 15 beers/day (105 beers/week), 1 fifth of liquor per day (7/week)     Social History   Substance and Sexual Activity  Drug Use Not Currently   Types: Methamphetamines, Marijuana   Comment: last used 04/10/2019  Social History   Socioeconomic History   Marital status: Single    Spouse name: Not on file   Number of children: Not on file   Years of education: Not on file   Highest education level: Not on file  Occupational History   Occupation: unemployed  Tobacco Use   Smoking status: Every Day    Packs/day: 0.50    Types: Cigarettes   Smokeless tobacco: Never   Tobacco comments:    unable to smoke while incarcerated 6+ months 02/13/20  Vaping Use   Vaping Use: Never used   Substance and Sexual Activity   Alcohol use: Yes    Alcohol/week: 105.0 standard drinks of alcohol    Types: 105 Cans of beer per week    Comment: 15 beers/day (105 beers/week), 1 fifth of liquor per day (7/week)   Drug use: Not Currently    Types: Methamphetamines, Marijuana    Comment: last used 04/10/2019   Sexual activity: Yes    Partners: Male, Male    Comment: declined condoms  03/2021  Other Topics Concern   Not on file  Social History Narrative   "Currently living on the streets"   Independent at baseline   Occasionally goes to the [facility]   Social Determinants of Health   Financial Resource Strain: Not on file  Food Insecurity: Food Insecurity Present (04/27/2022)   Hunger Vital Sign    Worried About Running Out of Food in the Last Year: Sometimes true    Ran Out of Food in the Last Year: Sometimes true  Transportation Needs: Unmet Transportation Needs (04/27/2022)   PRAPARE - Administrator, Civil Service (Medical): Yes    Lack of Transportation (Non-Medical): Yes  Physical Activity: Not on file  Stress: Not on file  Social Connections: Not on file   Additional Social History:    Allergies:   Allergies  Allergen Reactions   Tegretol [Carbamazepine] Other (See Comments)    Caused vertigo for 2 days after taking it   Caffeine Palpitations    Labs:  Results for orders placed or performed during the hospital encounter of 06/18/22 (from the past 48 hour(s))  Comprehensive metabolic panel     Status: Abnormal   Collection Time: 06/18/22  8:16 PM  Result Value Ref Range   Sodium 143 135 - 145 mmol/L   Potassium 3.8 3.5 - 5.1 mmol/L   Chloride 109 98 - 111 mmol/L   CO2 21 (L) 22 - 32 mmol/L   Glucose, Bld 91 70 - 99 mg/dL    Comment: Glucose reference range applies only to samples taken after fasting for at least 8 hours.   BUN 12 6 - 20 mg/dL   Creatinine, Ser 1.32 0.61 - 1.24 mg/dL   Calcium 9.3 8.9 - 44.0 mg/dL   Total Protein 7.4 6.5 - 8.1 g/dL    Albumin 4.0 3.5 - 5.0 g/dL   AST 34 15 - 41 U/L   ALT 16 0 - 44 U/L   Alkaline Phosphatase 52 38 - 126 U/L   Total Bilirubin 0.7 0.3 - 1.2 mg/dL   GFR, Estimated >10 >27 mL/min    Comment: (NOTE) Calculated using the CKD-EPI Creatinine Equation (2021)    Anion gap 13 5 - 15    Comment: Performed at [facility], [address], [place], [place] [address]  Ethanol     Status: Abnormal   Collection Time: 06/18/22  8:16 PM  Result Value Ref Range   Alcohol,  Ethyl (B) 339 (HH) <10 mg/dL    Comment: CRITICAL RESULT CALLED TO, READ BACK BY AND VERIFIED WITH AMY COHEN 06/18/22 2105 [facility] (NOTE) Lowest detectable limit for serum alcohol is 10 mg/dL.  For medical purposes only. Performed at [facility], [address] [place], [place] [address]   cbc     Status: None   Collection Time: 06/18/22  8:16 PM  Result Value Ref Range   WBC 4.5 4.0 - 10.5 K/uL   RBC 4.39 4.22 - 5.81 MIL/uL   Hemoglobin 14.5 13.0 - 17.0 g/dL   HCT 52.842.2 41.339.0 - 24.452.0 %   MCV 96.1 80.0 - 100.0 fL   MCH 33.0 26.0 - 34.0 pg   MCHC 34.4 30.0 - 36.0 g/dL   RDW 01.014.3 27.211.5 - 53.615.5 %   Platelets 162 150 - 400 K/uL   nRBC 0.0 0.0 - 0.2 %    Comment: Performed at [facility], [address] [place], [place] [address]  Resp Panel by RT-PCR (Flu A&B, Covid) Anterior Nasal Swab     Status: None   Collection Time: 06/19/22 12:25 AM   Specimen: Anterior Nasal Swab  Result Value Ref Range   SARS Coronavirus 2 by RT PCR NEGATIVE NEGATIVE    Comment: (NOTE) SARS-CoV-2 target nucleic acids are NOT DETECTED.  The SARS-CoV-2 RNA is generally detectable in upper respiratory specimens during the acute phase of infection. The lowest concentration of SARS-CoV-2 viral copies this assay can detect is 138 copies/mL. A negative result does not preclude SARS-Cov-2 infection and should not be used as the sole basis for treatment or other patient management decisions. A negative result may occur with   improper specimen collection/handling, submission of specimen other than nasopharyngeal swab, presence of viral mutation(s) within the areas targeted by this assay, and inadequate number of viral copies(<138 copies/mL). A negative result must be combined with clinical observations, patient history, and epidemiological information. The expected result is Negative.  Fact Sheet for Patients:  BloggerCourse.comhttps://www.fda.gov/media/152166/download  Fact Sheet for Healthcare Providers:  SeriousBroker.ithttps://www.fda.gov/media/152162/download  This test is no t yet approved or cleared by the [place] FDA and  has been authorized for detection and/or diagnosis of SARS-CoV-2 by FDA under an Emergency Use Authorization (EUA). This EUA will remain  in effect (meaning this test can be used) for the duration of the COVID-19 declaration under Section 564(b)(1) of the Act, 21 U.S.C.section 360bbb-3(b)(1), unless the authorization is terminated  or revoked sooner.       Influenza A by PCR NEGATIVE NEGATIVE   Influenza B by PCR NEGATIVE NEGATIVE    Comment: (NOTE) The Xpert Xpress SARS-CoV-2/FLU/RSV plus assay is intended as an aid in the diagnosis of influenza from Nasopharyngeal swab specimens and should not be used as a sole basis for treatment. Nasal washings and aspirates are unacceptable for Xpert Xpress SARS-CoV-2/FLU/RSV testing.  Fact Sheet for Patients: BloggerCourse.comhttps://www.fda.gov/media/152166/download  Fact Sheet for Healthcare Providers: SeriousBroker.ithttps://www.fda.gov/media/152162/download  This test is not yet approved or cleared by the [place] FDA and has been authorized for detection and/or diagnosis of SARS-CoV-2 by FDA under an Emergency Use Authorization (EUA). This EUA will remain in effect (meaning this test can be used) for the duration of the COVID-19 declaration under Section 564(b)(1) of the Act, 21 U.S.C. section 360bbb-3(b)(1), unless the authorization is terminated or revoked.  Performed at  [facility], [address] [place], [place] [address]   Urine Drug Screen, Qualitative     Status: Abnormal  Collection Time: 06/19/22 10:44 AM  Result Value Ref Range   Tricyclic, Ur Screen NONE DETECTED NONE DETECTED   Amphetamines, Ur Screen NONE DETECTED NONE DETECTED   MDMA (Ecstasy)Ur Screen NONE DETECTED NONE DETECTED   Cocaine Metabolite,Ur Mark NONE DETECTED NONE DETECTED   Opiate, Ur Screen NONE DETECTED NONE DETECTED   Phencyclidine (PCP) Ur S NONE DETECTED NONE DETECTED   Cannabinoid 50 Ng, Ur Magnet NONE DETECTED NONE DETECTED   Barbiturates, Ur Screen NONE DETECTED NONE DETECTED   Benzodiazepine, Ur Scrn POSITIVE (A) NONE DETECTED   Methadone Scn, Ur NONE DETECTED NONE DETECTED    Comment: (NOTE) Tricyclics + metabolites, urine    Cutoff 1000 ng/mL Amphetamines + metabolites, urine  Cutoff 1000 ng/mL MDMA (Ecstasy), urine              Cutoff 500 ng/mL Cocaine Metabolite, urine          Cutoff 300 ng/mL Opiate + metabolites, urine        Cutoff 300 ng/mL Phencyclidine (PCP), urine         Cutoff 25 ng/mL Cannabinoid, urine                 Cutoff 50 ng/mL Barbiturates + metabolites, urine  Cutoff 200 ng/mL Benzodiazepine, urine              Cutoff 200 ng/mL Methadone, urine                   Cutoff 300 ng/mL  The urine drug screen provides only a preliminary, unconfirmed analytical test result and should not be used for non-medical purposes. Clinical consideration and professional judgment should be applied to any positive drug screen result due to possible interfering substances. A more specific alternate chemical method must be used in order to obtain a confirmed analytical result. Gas chromatography / mass spectrometry (GC/MS) is the preferred confirm atory method. Performed at [facility], [address] [place], [place] [address]     Current Facility-Administered Medications  Medication Dose Route Frequency Provider Last Rate Last Admin    bictegravir-emtricitabine-tenofovir AF (BIKTARVY) 50-200-25 MG per tablet 1 tablet  1 tablet Oral Daily Gilles Chiquito, MD   1 tablet at 06/19/22 0350   folic acid (FOLVITE) tablet 1 mg  1 mg Oral Daily Gilles Chiquito, MD   1 mg at 06/19/22 0938   levETIRAcetam (KEPPRA) tablet 500 mg  500 mg Oral BID Gilles Chiquito, MD   500 mg at 06/19/22 1829   LORazepam (ATIVAN) injection 1 mg  1 mg Intravenous Once Arnaldo Natal, MD       LORazepam (ATIVAN) tablet 1-4 mg  1-4 mg Oral Q1H PRN Gilles Chiquito, MD   3 mg at 06/19/22 9371   Or   LORazepam (ATIVAN) injection 1-4 mg  1-4 mg Intravenous Q1H PRN Gilles Chiquito, MD       multivitamin with minerals tablet 1 tablet  1 tablet Oral Daily Gilles Chiquito, MD   1 tablet at 06/19/22 6967   thiamine (VITAMIN B1) tablet 100 mg  100 mg Oral Daily Gilles Chiquito, MD   100 mg at 06/19/22 8938   Or   thiamine (VITAMIN B1) injection 100 mg  100 mg Intravenous Daily Gilles Chiquito, MD       Current Outpatient Medications  Medication Sig Dispense Refill   bictegravir-emtricitabine-tenofovir AF (BIKTARVY) 50-200-25 MG TABS tablet Take 1 tablet by mouth daily. 30 tablet 0  gabapentin (NEURONTIN) 300 MG capsule Take 1 capsule (300 mg total) by mouth 3 (three) times daily. (Patient taking differently: Take 900 mg by mouth 3 (three) times daily as needed (nerve pain).) 90 capsule 3   levETIRAcetam (KEPPRA) 500 MG tablet Take 1 tablet (500 mg total) by mouth 2 (two) times daily. 60 tablet 3   Multiple Vitamin (MULTIVITAMIN WITH MINERALS) TABS tablet Take 1 tablet by mouth daily. 30 tablet 3   thiamine 100 MG tablet Take 1 tablet (100 mg total) by mouth daily. (Patient taking differently: Take 100 mg by mouth in the morning and at bedtime.) 30 tablet 3   chlordiazePOXIDE (LIBRIUM) 25 MG capsule 50mg  PO TID x 1D, then 25-50mg  PO BID X 1D, then 25-50mg  PO QD X 1D (Patient not taking: Reported on 06/18/2022) 10 capsule 0   folic acid (FOLVITE) 1 MG tablet  Take 1 tablet (1 mg total) by mouth daily. (Patient not taking: Reported on 06/18/2022) 30 tablet 3   pantoprazole (PROTONIX) 40 MG tablet Take 1 tablet (40 mg total) by mouth 2 (two) times daily. (Patient not taking: Reported on 05/21/2022) 60 tablet 3    Musculoskeletal: Strength & Muscle Tone: within normal limits Gait & Station: normal Patient leans: N/A            Psychiatric Specialty Exam:  Presentation  General Appearance: Disheveled  Eye Contact:Good  Speech:Garbled; Slurred  Speech Volume:Decreased  Handedness:Right   Mood and Affect  Mood:Euthymic  Affect:Blunt; Congruent; Depressed; Inappropriate   Thought Process  Thought Processes:Coherent  Descriptions of Associations:Intact  Orientation:Full (Time, Place and Person)  Thought Content:Logical  History of Schizophrenia/Schizoaffective disorder:No  Duration of Psychotic Symptoms:Less than six months  Hallucinations:Hallucinations: None  Ideas of Reference:None  Suicidal Thoughts:Suicidal Thoughts: Yes, Passive SI Passive Intent and/or Plan: With Intent; Without Plan; Without Means to Carry Out  Homicidal Thoughts:Homicidal Thoughts: No   Sensorium  Memory:Immediate Fair; Recent Fair; Remote Fair  Judgment:Poor  Insight:Poor   Executive Functions  Concentration:Poor  Attention Span:Poor  [place]   Psychomotor Activity  Psychomotor Activity:Psychomotor Activity: Normal   Assets  Assets:Communication Skills; Desire for Improvement; Housing; Leisure Time; Physical Health; Resilience; Social Support   Sleep  Sleep:Sleep: Fair   Physical Exam: Physical Exam Vitals and nursing note reviewed.  Constitutional:      Appearance: Normal appearance. He is ill-appearing.  HENT:     Head: Normocephalic and atraumatic.     Mouth/Throat:     Pharynx: Oropharynx is clear.  Eyes:     Pupils: Pupils are equal, round, and reactive  to light.  Cardiovascular:     Rate and Rhythm: Normal rate and regular rhythm.  Pulmonary:     Effort: Pulmonary effort is normal.     Breath sounds: Normal breath sounds.  Abdominal:     General: Abdomen is flat.     Palpations: Abdomen is soft.  Musculoskeletal:        General: Normal range of motion.  Skin:    General: Skin is warm and dry.  Neurological:     General: No focal deficit present.     Mental Status: He is alert. Mental status is at baseline.  Psychiatric:        Attention and Perception: Attention normal.        Mood and Affect: Mood is anxious.        Speech: Speech normal.        Behavior: Behavior is cooperative.  Thought Content: Thought content normal.        Cognition and Memory: Cognition normal.        Judgment: Judgment normal.    Review of Systems  Constitutional: Negative.   HENT: Negative.    Eyes: Negative.   Respiratory: Negative.    Cardiovascular: Negative.   Gastrointestinal: Negative.   Musculoskeletal: Negative.   Skin: Negative.   Neurological: Negative.   Psychiatric/Behavioral:  Positive for depression and substance abuse. Negative for suicidal ideas.    Blood pressure 114/66, pulse 90, temperature 98.1 F (36.7 C), temperature source Oral, resp. rate 16, SpO2 98 %. There is no height or weight on file to calculate BMI.  Treatment Plan Summary: Plan patient with alcohol abuse receiving detox.  Currently lucid no sign of delirium.  Will make sure that his usual medicines are prescribed.  Patient does not meet commitment criteria.  Recommend that TTS look into referral to [facility] for alcohol detox.  Disposition: Does not meet criteria for inpatient psychiatric admission but should be referred for alcohol detox  Alethia Berthold, MD 06/19/2022 1:00 PM

## 2022-06-19 NOTE — ED Notes (Signed)
To bathroom with RN and walker. Pt very unsteady on feet.

## 2022-06-19 NOTE — ED Provider Notes (Incomplete)
[facility] Provider Note    Event Date/Time   First MD Initiated Contact with Patient 06/18/22 2028     (approximate)   History   Alcohol Intoxication   HPI  Gary [name] is a 36 y.o. male  ***       Physical Exam   Triage Vital Signs: ED Triage Vitals  Enc Vitals Group     BP 06/18/22 2011 101/89     Pulse Rate 06/18/22 2011 85     Resp 06/18/22 2011 20     Temp 06/18/22 2011 98.1 F (36.7 C)     Temp src --      SpO2 06/18/22 2011 98 %     Weight --      Height --      Head Circumference --      Peak Flow --      Pain Score 06/18/22 2020 0     Pain Loc --      Pain Edu? --      Excl. in GC? --     Most recent vital signs: Vitals:   06/18/22 2011  BP: 101/89  Pulse: 85  Resp: 20  Temp: 98.1 F (36.7 C)  SpO2: 98%     Constitutional: Alert *** Eyes: Conjunctivae are normal.  Head: Atraumatic. Nose: No congestion/rhinnorhea. Mouth/Throat: Mucous membranes are moist.   Neck: Painless ROM.  Cardiovascular:   Good peripheral circulation. Respiratory: Normal respiratory effort.  No retractions.  Gastrointestinal: Soft and nontender.  Musculoskeletal:  no deformity Neurologic:  MAE spontaneously. No gross focal neurologic deficits are appreciated. *** Skin:  Skin is warm, dry and intact. No rash noted. Psychiatric: Mood and affect are normal. Speech and behavior are normal.    ED Results / Procedures / Treatments   Labs (all labs ordered are listed, but only abnormal results are displayed) Labs Reviewed  COMPREHENSIVE METABOLIC PANEL - Abnormal; Notable for the following components:      Result Value   CO2 21 (*)    All other components within normal limits  ETHANOL - Abnormal; Notable for the following components:   Alcohol, Ethyl (B) 339 (*)    All other components within normal limits  RESP PANEL BY RT-PCR (FLU A&B, COVID) ARPGX2  CBC  URINE DRUG SCREEN, QUALITATIVE ([facility] ONLY)      EKG  ***   RADIOLOGY ***   PROCEDURES:  Critical Care performed: {CriticalCareYesNo:19197::"Yes, see critical care procedure note(s)","No"}  Procedures   MEDICATIONS ORDERED IN ED: Medications  bictegravir-emtricitabine-tenofovir AF (BIKTARVY) 50-200-25 MG per tablet 1 tablet (has no administration in time range)  levETIRAcetam (KEPPRA) tablet 500 mg (has no administration in time range)  LORazepam (ATIVAN) tablet 1-4 mg (has no administration in time range)    Or  LORazepam (ATIVAN) injection 1-4 mg (has no administration in time range)  thiamine (VITAMIN B1) tablet 100 mg (has no administration in time range)    Or  thiamine (VITAMIN B1) injection 100 mg (has no administration in time range)  folic acid (FOLVITE) tablet 1 mg (has no administration in time range)  multivitamin with minerals tablet 1 tablet (has no administration in time range)     IMPRESSION / MDM / ASSESSMENT AND PLAN / ED COURSE  I reviewed the triage vital signs and the nursing notes.  Differential diagnosis includes, but is not limited to, ***      Patient's presentation is most consistent with {EM COPA:27473}   FINAL CLINICAL IMPRESSION(S) / ED DIAGNOSES   Final diagnoses:  None     Rx / DC Orders   ED Discharge Orders     None        Note:  This document was prepared using Dragon voice recognition software and may include unintentional dictation errors.

## 2022-06-19 NOTE — BH Assessment (Signed)
Patient has been accepted to [facility]  Patient assigned to 3 [facility] Accepting physician is Dr. Forrestine Him.  Call report to [phone].  Representative was [place].   ER Staff is aware of it:  Fleet Contras, ER Herbert Spires, Patient's Nurse  Address: [address],  [place], [place] [address]

## 2022-06-19 NOTE — ED Notes (Signed)
Pt dressed into paper scrubs. Belongings include shoes, socks, jeans, shirt all placed into patient belonging bag. Pt pending acceptance to [facility]. Pt willing and open to this transfer if possible.

## 2022-06-19 NOTE — ED Notes (Signed)
Pt given dinner tray and drink at this time. 

## 2022-06-19 NOTE — ED Notes (Signed)
Patient resting in bed free from sign of distress. Breathing unlabored  with symmetric chest rise and fall. Bed low and locked with side rails raised x2. Pt in view of nurses station

## 2022-06-25 ENCOUNTER — Inpatient Hospital Stay: Payer: Self-pay | Admitting: Physician Assistant

## 2022-07-06 DIAGNOSIS — F10120 Alcohol abuse with intoxication, uncomplicated: Secondary | ICD-10-CM | POA: Insufficient documentation

## 2022-07-06 DIAGNOSIS — Y906 Blood alcohol level of 120-199 mg/100 ml: Secondary | ICD-10-CM | POA: Insufficient documentation

## 2022-07-06 DIAGNOSIS — Z760 Encounter for issue of repeat prescription: Secondary | ICD-10-CM | POA: Insufficient documentation

## 2022-07-07 ENCOUNTER — Emergency Department (HOSPITAL_COMMUNITY)
Admission: EM | Admit: 2022-07-07 | Discharge: 2022-07-07 | Disposition: A | Discharge status: Home / Self Care | Payer: Self-pay | Attending: Emergency Medicine | Admitting: Emergency Medicine

## 2022-07-07 ENCOUNTER — Other Ambulatory Visit (HOSPITAL_COMMUNITY): Payer: Self-pay

## 2022-07-07 ENCOUNTER — Encounter (HOSPITAL_COMMUNITY): Payer: Self-pay | Admitting: Emergency Medicine

## 2022-07-07 DIAGNOSIS — Z760 Encounter for issue of repeat prescription: Secondary | ICD-10-CM

## 2022-07-07 DIAGNOSIS — F10920 Alcohol use, unspecified with intoxication, uncomplicated: Secondary | ICD-10-CM

## 2022-07-07 LAB — COMPREHENSIVE METABOLIC PANEL
ALT: 23 U/L (ref 0–44)
AST: 29 U/L (ref 15–41)
Albumin: 4.7 g/dL (ref 3.5–5.0)
Alkaline Phosphatase: 44 U/L (ref 38–126)
Anion gap: 15 (ref 5–15)
BUN: 10 mg/dL (ref 6–20)
CO2: 22 mmol/L (ref 22–32)
Calcium: 9.6 mg/dL (ref 8.9–10.3)
Chloride: 107 mmol/L (ref 98–111)
Creatinine, Ser: 0.84 mg/dL (ref 0.61–1.24)
GFR, Estimated: >60 mL/min (ref >60)
Glucose, Bld: 46 mg/dL — ABNORMAL LOW (ref 70–99)
Potassium: 3.7 mmol/L (ref 3.5–5.1)
Sodium: 144 mmol/L (ref 135–145)
Total Bilirubin: 0.5 mg/dL (ref 0.3–1.2)
Total Protein: 8 g/dL (ref 6.5–8.1)

## 2022-07-07 LAB — CBC
HCT: 40.3 % (ref 39.0–52.0)
Hemoglobin: 13.5 g/dL (ref 13.0–17.0)
MCH: 33.3 pg (ref 26.0–34.0)
MCHC: 33.5 g/dL (ref 30.0–36.0)
MCV: 99.3 fL (ref 80.0–100.0)
Platelets: 396 10*3/uL (ref 150–400)
RBC: 4.06 MIL/uL — ABNORMAL LOW (ref 4.22–5.81)
RDW: 13.5 % (ref 11.5–15.5)
WBC: 5.2 10*3/uL (ref 4.0–10.5)
nRBC: 0 % (ref 0.0–0.2)

## 2022-07-07 LAB — CBG MONITORING, ED
Glucose-Capillary: 160 mg/dL — ABNORMAL HIGH (ref 70–99)
Glucose-Capillary: 43 mg/dL — CL (ref 70–99)
Glucose-Capillary: 102 mg/dL — ABNORMAL HIGH (ref 70–99)

## 2022-07-07 LAB — ETHANOL: Alcohol, Ethyl (B): 183 mg/dL — ABNORMAL HIGH (ref <10)

## 2022-07-07 MED ORDER — LEVETIRACETAM 500 MG PO TABS
500.0000 mg | ORAL_TABLET | Freq: Two times a day (BID) | ORAL | 3 refills | Status: DC
  Filled 2022-07-07: qty 60, 30d supply, fill #0

## 2022-07-07 MED ORDER — DEXTROSE 50 % IV SOLN
INTRAVENOUS | Status: AC
  Filled 2022-07-07: qty 50

## 2022-07-07 MED ORDER — LEVETIRACETAM 500 MG PO TABS
500.0000 mg | ORAL_TABLET | Freq: Two times a day (BID) | ORAL | 0 refills | Status: DC
  Filled 2022-07-07: qty 60, 30d supply, fill #0

## 2022-07-07 MED ORDER — IBUPROFEN 400 MG PO TABS
600.0000 mg | ORAL_TABLET | Freq: Once | ORAL | Status: AC
  Administered 2022-07-07: 600 mg via ORAL
  Filled 2022-07-07: qty 1

## 2022-07-07 NOTE — Discharge Instructions (Signed)
I recommend that you taper down on drinking.  Make sure you hydrate well, please take your prescribed Keppra as prescribed.
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
## 2022-07-07 NOTE — ED Notes (Signed)
Discharge instructions reviewed with patient. Patient denies any questions or concerns. Patient ambulatory out of ED. 

## 2022-07-07 NOTE — ED Notes (Signed)
Noted pt glucose on CMP 46 mg/dL. Rechecked in triage exam area and noted 43 mg/dL. Pt is lethargic but responding to verbal stimuli. IV started and D50 given per Dr. Wilkie Aye.
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
## 2022-07-08 ENCOUNTER — Ambulatory Visit: Payer: Self-pay | Admitting: Internal Medicine

## 2022-07-15 ENCOUNTER — Emergency Department (HOSPITAL_COMMUNITY): Payer: Self-pay

## 2022-07-15 ENCOUNTER — Encounter (HOSPITAL_COMMUNITY): Payer: Self-pay | Admitting: Emergency Medicine

## 2022-07-15 ENCOUNTER — Emergency Department (HOSPITAL_COMMUNITY)
Admission: EM | Admit: 2022-07-15 | Discharge: 2022-07-15 | Disposition: A | Discharge status: Home / Self Care | Payer: Self-pay | Attending: Emergency Medicine | Admitting: Emergency Medicine

## 2022-07-15 ENCOUNTER — Other Ambulatory Visit: Payer: Self-pay

## 2022-07-15 DIAGNOSIS — F10129 Alcohol abuse with intoxication, unspecified: Secondary | ICD-10-CM | POA: Insufficient documentation

## 2022-07-15 DIAGNOSIS — F1910 Other psychoactive substance abuse, uncomplicated: Secondary | ICD-10-CM | POA: Insufficient documentation

## 2022-07-15 DIAGNOSIS — Z5900 Homelessness unspecified: Secondary | ICD-10-CM | POA: Insufficient documentation

## 2022-07-15 DIAGNOSIS — Z20822 Contact with and (suspected) exposure to covid-19: Secondary | ICD-10-CM | POA: Insufficient documentation

## 2022-07-15 LAB — CBC WITH DIFFERENTIAL/PLATELET
Abs Immature Granulocytes: 0.01 10*3/uL (ref 0.00–0.07)
Basophils Absolute: 0 10*3/uL (ref 0.0–0.1)
Basophils Relative: 1 %
Eosinophils Absolute: 0.1 10*3/uL (ref 0.0–0.5)
Eosinophils Relative: 3 %
HCT: 37.4 % — ABNORMAL LOW (ref 39.0–52.0)
Hemoglobin: 12.3 g/dL — ABNORMAL LOW (ref 13.0–17.0)
Immature Granulocytes: 0 %
Lymphocytes Relative: 48 %
Lymphs Abs: 1.8 10*3/uL (ref 0.7–4.0)
MCH: 34.1 pg — ABNORMAL HIGH (ref 26.0–34.0)
MCHC: 32.9 g/dL (ref 30.0–36.0)
MCV: 103.6 fL — ABNORMAL HIGH (ref 80.0–100.0)
Monocytes Absolute: 0.2 10*3/uL (ref 0.1–1.0)
Monocytes Relative: 6 %
Neutro Abs: 1.6 10*3/uL — ABNORMAL LOW (ref 1.7–7.7)
Neutrophils Relative %: 42 %
Platelets: 205 10*3/uL (ref 150–400)
RBC: 3.61 MIL/uL — ABNORMAL LOW (ref 4.22–5.81)
RDW: 13.6 % (ref 11.5–15.5)
WBC: 3.8 10*3/uL — ABNORMAL LOW (ref 4.0–10.5)
nRBC: 0 % (ref 0.0–0.2)

## 2022-07-15 LAB — I-STAT VENOUS BLOOD GAS, ED
Acid-Base Excess: 1 mmol/L (ref 0.0–2.0)
Bicarbonate: 24.8 mmol/L (ref 20.0–28.0)
Calcium, Ion: 1.01 mmol/L — ABNORMAL LOW (ref 1.15–1.40)
HCT: 35 % — ABNORMAL LOW (ref 39.0–52.0)
Hemoglobin: 11.9 g/dL — ABNORMAL LOW (ref 13.0–17.0)
O2 Saturation: 99 %
Potassium: 3.5 mmol/L (ref 3.5–5.1)
Sodium: 145 mmol/L (ref 135–145)
TCO2: 26 mmol/L (ref 22–32)
pCO2, Ven: 37.6 mmHg — ABNORMAL LOW (ref 44–60)
pH, Ven: 7.427 (ref 7.25–7.43)
pO2, Ven: 142 mmHg — ABNORMAL HIGH (ref 32–45)

## 2022-07-15 LAB — COMPREHENSIVE METABOLIC PANEL
ALT: 17 U/L (ref 0–44)
AST: 20 U/L (ref 15–41)
Albumin: 3.5 g/dL (ref 3.5–5.0)
Alkaline Phosphatase: 44 U/L (ref 38–126)
Anion gap: 9 (ref 5–15)
BUN: 7 mg/dL (ref 6–20)
CO2: 22 mmol/L (ref 22–32)
Calcium: 8.4 mg/dL — ABNORMAL LOW (ref 8.9–10.3)
Chloride: 112 mmol/L — ABNORMAL HIGH (ref 98–111)
Creatinine, Ser: 0.66 mg/dL (ref 0.61–1.24)
GFR, Estimated: >60 mL/min (ref >60)
Glucose, Bld: 94 mg/dL (ref 70–99)
Potassium: 3.3 mmol/L — ABNORMAL LOW (ref 3.5–5.1)
Sodium: 143 mmol/L (ref 135–145)
Total Bilirubin: 0.4 mg/dL (ref 0.3–1.2)
Total Protein: 5.9 g/dL — ABNORMAL LOW (ref 6.5–8.1)

## 2022-07-15 LAB — ETHANOL: Alcohol, Ethyl (B): 270 mg/dL — ABNORMAL HIGH (ref <10)

## 2022-07-15 LAB — OSMOLALITY: Osmolality: 364 mOsm/kg (ref 275–295)

## 2022-07-15 LAB — RESP PANEL BY RT-PCR (FLU A&B, COVID) ARPGX2
Influenza A by PCR: NEGATIVE
Influenza B by PCR: NEGATIVE
SARS Coronavirus 2 by RT PCR: NEGATIVE

## 2022-07-15 LAB — ACETAMINOPHEN LEVEL: Acetaminophen (Tylenol), Serum: <10 ug/mL — ABNORMAL LOW (ref 10–30)

## 2022-07-15 LAB — CK: Total CK: 98 U/L (ref 49–397)

## 2022-07-15 LAB — LIPASE, BLOOD: Lipase: 70 U/L — ABNORMAL HIGH (ref 11–51)

## 2022-07-15 LAB — SALICYLATE LEVEL: Salicylate Lvl: <7 mg/dL — ABNORMAL LOW (ref 7.0–30.0)

## 2022-07-15 LAB — AMMONIA: Ammonia: 27 umol/L (ref 9–35)

## 2022-07-15 MED ORDER — SODIUM CHLORIDE 0.9 % IV BOLUS
1000.0000 mL | Freq: Once | INTRAVENOUS | Status: AC
  Administered 2022-07-15: 1000 mL via INTRAVENOUS

## 2022-07-15 MED ORDER — ONDANSETRON HCL 4 MG/2ML IJ SOLN
4.0000 mg | Freq: Once | INTRAMUSCULAR | Status: AC
  Administered 2022-07-15: 4 mg via INTRAVENOUS
  Filled 2022-07-15: qty 2

## 2022-07-15 NOTE — Discharge Instructions (Addendum)
It was a pleasure caring for you today in the emergency department. ° °Please return to the emergency department for any worsening or worrisome symptoms. ° ° °

## 2022-07-15 NOTE — ED Provider Notes (Signed)
[facility] EMERGENCY DEPARTMENT Provider Note   CSN: [id number] Arrival date & time: 07/15/22  0146     History  Chief Complaint  Patient presents with   Altered Mental Status   Alcohol Intoxication    Gary [name] is a 36 y.o. male.  Patient as above with significant medical history as below, including HIV not on antivirals, etoh abuse, bipolar 2/anxiety/ptsd, seizures on keppra w/ variable compliance, schizophrenia who presents to the ED with complaint of ams. Pt is a poor historian.  Reports he drank his typical amount of alcohol today here.  He drinks alcohol daily.  Unable to quantify amount of alcohol he drank, he reports it was "a good amount.".  Says he missed 1 dose of his Keppra over the past 7 days which was yesterday.  Unsure of his last seizure.  Patient reports generalized body aches, said he is very sad and wants to "be a better man."  He is also concerned about relationship issues with his boyfriend.  Denies current SI or HI.  No intentional ingestion was reported.  Denies coingestion.  Reports uses illicit drugs intermittently but none in the past 72 hours.  He has some mild nausea but no vomiting.     Past Medical History:  Diagnosis Date   Alcohol abuse    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   Anxiety    Bipolar 2 disorder (HCC)    HIV (human immunodeficiency virus infection) (HCC)    PTSD (post-traumatic stress disorder)    Schizophrenia (HCC)    Seizures (HCC)    Subdural hematoma (HCC)     Past Surgical History:  Procedure Laterality Date   BIOPSY  04/19/2022   Procedure: BIOPSY;  Surgeon: Lemar Lofty., MD;  Location: [facility] ENDOSCOPY;  Service: Gastroenterology;;   ENTEROSCOPY N/A 04/19/2022   Procedure: ENTEROSCOPY;  Surgeon: Lemar Lofty., MD;  Location: [facility] ENDOSCOPY;  Service: Gastroenterology;  Laterality: N/A;   INCISION AND DRAINAGE PERIRECTAL ABSCESS N/A 09/24/2016   Procedure: IRRIGATION AND DEBRIDEMENT  PERIRECTAL ABSCESS;  Surgeon: Ricarda Frame, MD;  Location: [facility] ORS;  Service: General;  Laterality: N/A;   none       The history is provided by the patient. No language interpreter was used.  Altered Mental Status Presenting symptoms: no confusion   Associated symptoms: nausea   Associated symptoms: no abdominal pain, no agitation, no fever, no headaches, no palpitations, no rash, no seizures and no vomiting   Alcohol Intoxication Pertinent negatives include no chest pain, no abdominal pain, no headaches and no shortness of breath.       Home Medications Prior to Admission medications   Medication Sig Start Date End Date Taking? Authorizing Provider  bictegravir-emtricitabine-tenofovir AF (BIKTARVY) 50-200-25 MG TABS tablet Take 1 tablet by mouth daily. 05/28/22   Briant Cedar, MD  chlordiazePOXIDE (LIBRIUM) 25 MG capsule 50mg  PO TID x 1D, then 25-50mg  PO BID X 1D, then 25-50mg  PO QD X 1D Patient not taking: Reported on 06/18/2022 06/12/22   Horton, 08/12/22, MD  folic acid (FOLVITE) 1 MG tablet Take 1 tablet (1 mg total) by mouth daily. Patient not taking: Reported on 06/18/2022 05/01/22   05/03/22, MD  gabapentin (NEURONTIN) 300 MG capsule Take 1 capsule (300 mg total) by mouth 3 (three) times daily. Patient taking differently: Take 900 mg by mouth 3 (three) times daily as needed (nerve pain). 05/01/22   05/03/22, MD  levETIRAcetam (KEPPRA) 500 MG tablet Take 1 tablet (500 mg  total) by mouth 2 (two) times daily. 07/07/22   Gailen Shelter, PA  Multiple Vitamin (MULTIVITAMIN WITH MINERALS) TABS tablet Take 1 tablet by mouth daily. 05/02/22   Kathlen Mody, MD  pantoprazole (PROTONIX) 40 MG tablet Take 1 tablet (40 mg total) by mouth 2 (two) times daily. Patient not taking: Reported on 05/21/2022 05/01/22   Kathlen Mody, MD  thiamine 100 MG tablet Take 1 tablet (100 mg total) by mouth daily. Patient taking differently: Take 100 mg by mouth in the morning and at bedtime.  05/01/22   Kathlen Mody, MD  famotidine (PEPCID) 20 MG tablet Take 1 tablet (20 mg total) by mouth 2 (two) times daily. Patient not taking: Reported on 06/01/2019 05/14/19 06/02/19  Benjiman Core, MD      Allergies    Tegretol [carbamazepine] and Caffeine    Review of Systems   Review of Systems  Unable to perform ROS: Mental status change  Constitutional:  Negative for chills and fever.  HENT:  Negative for facial swelling and trouble swallowing.   Eyes:  Negative for photophobia and visual disturbance.  Respiratory:  Negative for cough and shortness of breath.   Cardiovascular:  Negative for chest pain and palpitations.  Gastrointestinal:  Positive for nausea. Negative for abdominal pain and vomiting.  Genitourinary:  Negative for dysuria and hematuria.  Musculoskeletal:  Negative for gait problem and joint swelling.  Skin:  Negative for pallor and rash.  Neurological:  Negative for seizures, syncope and headaches.  Psychiatric/Behavioral:  Positive for dysphoric mood. Negative for agitation, confusion, self-injury and suicidal ideas.     Physical Exam Updated Vital Signs BP (!) 90/59 (BP Location: Left Arm)   Pulse 60   Temp (!) 97.5 F (36.4 C) (Oral)   Resp 15   Ht 6' (1.829 m)   Wt 68 kg   SpO2 98%   BMI 20.33 kg/m  Physical Exam Vitals and nursing note reviewed.  Constitutional:      General: He is not in acute distress.    Appearance: Normal appearance. He is diaphoretic. He is not ill-appearing.  HENT:     Head: Normocephalic.     Comments: Sutures to frontal portion of scalp, scab overlying sutures     Right Ear: External ear normal.     Left Ear: External ear normal.     Nose: Nose normal.  Eyes:     General: Vision grossly intact. Gaze aligned appropriately. No scleral icterus.    Extraocular Movements: Extraocular movements intact.     Pupils: Pupils are equal, round, and reactive to light.  Cardiovascular:     Rate and Rhythm: Normal rate and regular  rhythm.     Pulses: Normal pulses.  Pulmonary:     Effort: Pulmonary effort is normal. No respiratory distress.     Breath sounds: No stridor. No wheezing.  Abdominal:     General: Abdomen is flat. There is no distension.     Palpations: Abdomen is soft.     Tenderness: There is no abdominal tenderness.  Musculoskeletal:     Cervical back: Full passive range of motion without pain and normal range of motion.  Neurological:     Mental Status: He is alert. He is disoriented.     GCS: GCS eye subscore is 4. GCS verbal subscore is 4. GCS motor subscore is 6.     Motor: No seizure activity.     Comments: Appears intoxicated  Variable compliance with neurologic testing Will move all extremities  spontaneously and they will cross midline  Psychiatric:        Mood and Affect: Mood is depressed. Affect is tearful.        Speech: Speech is slurred.        Behavior: Behavior is cooperative.        Thought Content: Thought content does not include homicidal or suicidal ideation. Thought content does not include homicidal or suicidal plan.     ED Results / Procedures / Treatments   Labs (all labs ordered are listed, but only abnormal results are displayed) Labs Reviewed  CBC WITH DIFFERENTIAL/PLATELET - Abnormal; Notable for the following components:      Result Value   WBC 3.8 (*)    RBC 3.61 (*)    Hemoglobin 12.3 (*)    HCT 37.4 (*)    MCV 103.6 (*)    MCH 34.1 (*)    Neutro Abs 1.6 (*)    All other components within normal limits  COMPREHENSIVE METABOLIC PANEL - Abnormal; Notable for the following components:   Potassium 3.3 (*)    Chloride 112 (*)    Calcium 8.4 (*)    Total Protein 5.9 (*)    All other components within normal limits  LIPASE, BLOOD - Abnormal; Notable for the following components:   Lipase 70 (*)    All other components within normal limits  ETHANOL - Abnormal; Notable for the following components:   Alcohol, Ethyl (B) 270 (*)    All other components within  normal limits  SALICYLATE LEVEL - Abnormal; Notable for the following components:   Salicylate Lvl <7.0 (*)    All other components within normal limits  ACETAMINOPHEN LEVEL - Abnormal; Notable for the following components:   Acetaminophen (Tylenol), Serum <10 (*)    All other components within normal limits  OSMOLALITY - Abnormal; Notable for the following components:   Osmolality 364 (*)    All other components within normal limits  I-STAT VENOUS BLOOD GAS, ED - Abnormal; Notable for the following components:   pCO2, Ven 37.6 (*)    pO2, Ven 142 (*)    Calcium, Ion 1.01 (*)    HCT 35.0 (*)    Hemoglobin 11.9 (*)    All other components within normal limits  RESP PANEL BY RT-PCR (FLU A&B, COVID) ARPGX2  CK  AMMONIA  LEVETIRACETAM LEVEL    EKG EKG Interpretation  Date/Time:  Wednesday July 15 2022 02:36:10 EDT Ventricular Rate:  72 PR Interval:  154 QRS Duration: 108 QT Interval:  430 QTC Calculation: 470 R Axis:   89 Text Interpretation: Normal sinus rhythm Normal ECG When compared with ECG of 11-Jun-2022 23:19, PREVIOUS ECG IS PRESENT similar to prior no stemi Confirmed by Tanda Rockers ([id number]) on 07/15/2022 3:12:41 AM  Radiology CT Head Wo Contrast  Result Date: 07/15/2022 CLINICAL DATA:  Trauma. EXAM: CT HEAD WITHOUT CONTRAST TECHNIQUE: Contiguous axial images were obtained from the base of the skull through the vertex without intravenous contrast. RADIATION DOSE REDUCTION: This exam was performed according to the departmental dose-optimization program which includes automated exposure control, adjustment of the mA and/or kV according to patient size and/or use of iterative reconstruction technique. COMPARISON:  Head CT dated 05/20/2022. FINDINGS: Brain: The ventricles and sulci are appropriate size for the patient's age. The [place] Gentz-white matter discrimination is preserved. There is no acute intracranial hemorrhage. No mass effect or midline shift. No extra-axial fluid  collection. Vascular: No hyperdense vessel or unexpected calcification. Skull: Normal. Negative for  fracture or focal lesion. Sinuses/Orbits: No acute finding. Other: None IMPRESSION: No acute intracranial pathology. Electronically Signed   By: Elgie Collard M.D.   On: 07/15/2022 03:29   DG Chest Portable 1 View  Result Date: 07/15/2022 CLINICAL DATA:  Altered mental status. EXAM: PORTABLE CHEST 1 VIEW COMPARISON:  Chest radiograph dated 06/11/2022. FINDINGS: Mild eventration of the left hemidiaphragm. No focal consolidation, pleural effusion, pneumothorax. The cardiac silhouette is within normal limits. No acute osseous pathology. IMPRESSION: No active disease. Electronically Signed   By: Elgie Collard M.D.   On: 07/15/2022 02:40    Procedures Procedures    Medications Ordered in ED Medications  sodium chloride 0.9 % bolus 1,000 mL (0 mLs Intravenous Stopped 07/15/22 0418)  ondansetron (ZOFRAN) injection 4 mg (4 mg Intravenous Given 07/15/22 0246)    ED Course/ Medical Decision Making/ A&P                           Medical Decision Making Amount and/or Complexity of Data Reviewed Labs: ordered. Radiology: ordered. ECG/medicine tests: ordered.  Risk Prescription drug management.   This patient presents to the ED with chief complaint(s) of AMS, intoxication with pertinent past medical history of seizures on Keppra variable compliance, chronic alcohol abuse, HIV which further complicates the presenting complaint. The complaint involves an extensive differential diagnosis and also carries with it a high risk of complications and morbidity.    Differential diagnoses for altered mental status includes but is not exclusive to alcohol, illicit or prescription medications, intracranial pathology such as stroke, intracerebral hemorrhage, fever or infectious causes including sepsis, hypoxemia, uremia, trauma, endocrine related disorders such as diabetes, hypoglycemia, thyroid-related diseases,  etc.  Serious etiologies were considered.   The initial plan is to screening labs, imaging, ivf, zofran   Additional history obtained: Additional history obtained from  na Records reviewed previous admission documents and Care Everywhere/External Records prior ED visits, prior labs/imaging   Independent labs interpretation:  The following labs were independently interpreted:  Ethanol is elevated, lipase is minimally elevated 70.  He has no abdominal pain.  LFTs are stable. >>  Daily EtOH use, does not appear to be in acute withdrawal at this time  CBC with mild leukopenia, mild anemia > fairly similar to his baseline.  CPK is not elevated, patient denies seizure, recently compliant with his Keppra.  Independent visualization of imaging: - I independently visualized the following imaging with scope of interpretation limited to determining acute life threatening conditions related to emergency care: C T head, chest x-ray, which revealed no acute process  Cardiac monitoring was reviewed and interpreted by myself which shows NSR, ECG without acute ischemia   Treatment and Reassessment: IV fluids, antiemetic, p.o. challenge >> Symptoms greatly improved  Consultation: - Consulted or discussed management/test interpretation w/ external professional: toc  Consideration for admission or further workup: Admission was considered    36 year old male to the ED secondary to AMS, intoxication.  Patient does appear intoxicated on arrival.  Ethanol is elevated.  Patient unwilling to provide urine sample for UDS.  But denies any illicit drug use in the past 72 hours (hx methamphetamines, thc).  Patient has metabolized in the ER, reports he is feeling essentially back to his baseline.  Rpt neuro exam once sober is non-focal (initially was non-compliant). He is considering going back to alcohol rehab, given outpatient resources regarding this.  Does not appear to be in acute alcohol withdrawal at this  time.  He is not psychotic, no acute SI or HI.  Work-up today is reassuring.  Patient is stable for discharge at this time. Feels he is back to his baseline. Gait steady, clinically sober   The patient improved significantly and was discharged in stable condition. Detailed discussions were had with the patient regarding current findings, and need for close f/u with PCP or on call doctor. The patient has been instructed to return immediately if the symptoms worsen in any way for re-evaluation. Patient verbalized understanding and is in agreement with current care plan. All questions answered prior to discharge.        Social Determinants of health: Counseled patient for approximately 3 minutes regarding smoking/thc/illicit drug cessation. Discussed risks of smoking/illicit drugs and how they applied and affected their visit here today. Patient not ready to quit at this time, however will follow up with their primary doctor when they are.   CPT code: [id number]: intermediate counseling for smoking cessation   Social History   Tobacco Use   Smoking status: Every Day    Packs/day: 0.50    Types: Cigarettes   Smokeless tobacco: Never   Tobacco comments:    unable to smoke while incarcerated 6+ months 02/13/20  Vaping Use   Vaping Use: Never used  Substance Use Topics   Alcohol use: Yes    Alcohol/week: 105.0 standard drinks of alcohol    Types: 105 Cans of beer per week    Comment: 15 beers/day (105 beers/week), 1 fifth of liquor per day (7/week)   Drug use: Not Currently    Types: Methamphetamines, Marijuana    Comment: last used 04/10/2019            Final Clinical Impression(s) / ED Diagnoses Final diagnoses:  Alcohol abuse with intoxication (HCC)  Homeless  Polysubstance abuse [facility](HCC)    Rx / DC Orders ED Discharge Orders     None         Sloan LeiterGray, Dejean Tribby A, DO 07/16/22 0017

## 2022-07-15 NOTE — ED Notes (Signed)
Pt taken to xray 

## 2022-07-15 NOTE — ED Triage Notes (Signed)
Pt BIB EMS. Pt report drinking today. EMS called out b/c pt was not acting himself. Pt has hx of alcohol abuse, homelessness, epilepsy, bipolar, PTSD, etc. Pt is alert to name and place - states he is here for epilepsy.  VS with EMS  102/60 HR 95 98% RA CBG 92 EMS gave 250 of fluids en route

## 2022-07-15 NOTE — ED Notes (Signed)
Patient provided with food and drink ok'd per MD

## 2022-07-16 LAB — LEVETIRACETAM LEVEL: Levetiracetam Lvl: 15.1 ug/mL (ref 10.0–40.0)

## 2022-07-17 ENCOUNTER — Emergency Department (HOSPITAL_COMMUNITY)
Admission: EM | Admit: 2022-07-17 | Discharge: 2022-07-18 | Disposition: A | Discharge status: Home / Self Care | Payer: Self-pay | Attending: Emergency Medicine | Admitting: Emergency Medicine

## 2022-07-17 ENCOUNTER — Other Ambulatory Visit: Payer: Self-pay

## 2022-07-17 DIAGNOSIS — R45850 Homicidal ideations: Secondary | ICD-10-CM | POA: Insufficient documentation

## 2022-07-17 DIAGNOSIS — Z21 Asymptomatic human immunodeficiency virus [HIV] infection status: Secondary | ICD-10-CM | POA: Insufficient documentation

## 2022-07-17 DIAGNOSIS — F1010 Alcohol abuse, uncomplicated: Secondary | ICD-10-CM | POA: Insufficient documentation

## 2022-07-17 DIAGNOSIS — Y908 Blood alcohol level of 240 mg/100 ml or more: Secondary | ICD-10-CM | POA: Insufficient documentation

## 2022-07-17 LAB — CBC WITH DIFFERENTIAL/PLATELET
Abs Immature Granulocytes: 0.01 10*3/uL (ref 0.00–0.07)
Basophils Absolute: 0 10*3/uL (ref 0.0–0.1)
Basophils Relative: 1 %
Eosinophils Absolute: 0.2 10*3/uL (ref 0.0–0.5)
Eosinophils Relative: 5 %
HCT: 40.1 % (ref 39.0–52.0)
Hemoglobin: 13.4 g/dL (ref 13.0–17.0)
Immature Granulocytes: 0 %
Lymphocytes Relative: 47 %
Lymphs Abs: 2.1 10*3/uL (ref 0.7–4.0)
MCH: 33 pg (ref 26.0–34.0)
MCHC: 33.4 g/dL (ref 30.0–36.0)
MCV: 98.8 fL (ref 80.0–100.0)
Monocytes Absolute: 0.3 10*3/uL (ref 0.1–1.0)
Monocytes Relative: 6 %
Neutro Abs: 1.9 10*3/uL (ref 1.7–7.7)
Neutrophils Relative %: 41 %
Platelets: 164 10*3/uL (ref 150–400)
RBC: 4.06 MIL/uL — ABNORMAL LOW (ref 4.22–5.81)
RDW: 13.6 % (ref 11.5–15.5)
WBC: 4.5 10*3/uL (ref 4.0–10.5)
nRBC: 0 % (ref 0.0–0.2)

## 2022-07-17 LAB — COMPREHENSIVE METABOLIC PANEL
ALT: 17 U/L (ref 0–44)
AST: 30 U/L (ref 15–41)
Albumin: 3.8 g/dL (ref 3.5–5.0)
Alkaline Phosphatase: 42 U/L (ref 38–126)
Anion gap: 8 (ref 5–15)
BUN: 10 mg/dL (ref 6–20)
CO2: 24 mmol/L (ref 22–32)
Calcium: 8.2 mg/dL — ABNORMAL LOW (ref 8.9–10.3)
Chloride: 112 mmol/L — ABNORMAL HIGH (ref 98–111)
Creatinine, Ser: 0.6 mg/dL — ABNORMAL LOW (ref 0.61–1.24)
GFR, Estimated: >60 mL/min (ref >60)
Glucose, Bld: 91 mg/dL (ref 70–99)
Potassium: 3.8 mmol/L (ref 3.5–5.1)
Sodium: 144 mmol/L (ref 135–145)
Total Bilirubin: 0.5 mg/dL (ref 0.3–1.2)
Total Protein: 7 g/dL (ref 6.5–8.1)

## 2022-07-17 LAB — ACETAMINOPHEN LEVEL: Acetaminophen (Tylenol), Serum: <10 ug/mL — ABNORMAL LOW (ref 10–30)

## 2022-07-17 LAB — SALICYLATE LEVEL: Salicylate Lvl: <7 mg/dL — ABNORMAL LOW (ref 7.0–30.0)

## 2022-07-17 LAB — ETHANOL: Alcohol, Ethyl (B): 410 mg/dL (ref <10)

## 2022-07-17 MED ORDER — LEVETIRACETAM 500 MG PO TABS
500.0000 mg | ORAL_TABLET | Freq: Once | ORAL | Status: AC
  Administered 2022-07-17: 500 mg via ORAL
  Filled 2022-07-17: qty 1

## 2022-07-17 MED ORDER — ONDANSETRON HCL 4 MG/2ML IJ SOLN
4.0000 mg | Freq: Once | INTRAMUSCULAR | Status: AC
  Administered 2022-07-17: 4 mg via INTRAVENOUS
  Filled 2022-07-17: qty 2

## 2022-07-17 MED ORDER — SODIUM CHLORIDE 0.9 % IV BOLUS
1000.0000 mL | Freq: Once | INTRAVENOUS | Status: AC
  Administered 2022-07-17: 1000 mL via INTRAVENOUS
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
## 2022-07-17 NOTE — ED Provider Notes (Signed)
Emergency Department Provider Note   I have reviewed the triage vital signs and the nursing notes.   HISTORY  Chief Complaint Anxiety and Homicidal   HPI Gary [name] is a 36 y.o. male with past history of HIV, schizophrenia, homelessness, alcohol abuse presents to the emergency department with self-reported anxiety and apparent homicidal ideation.  He verbalized wanting to take a gun from [employer].  He was found on scene, apparently drunk, and arrives by ambulance.    Past Medical History:  Diagnosis Date   Alcohol abuse    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   Anxiety    Bipolar 2 disorder (HCC)    HIV (human immunodeficiency virus infection) (HCC)    PTSD (post-traumatic stress disorder)    Schizophrenia (HCC)    Seizures (HCC)    Subdural hematoma (HCC)     Review of Systems  Patient is intoxicated limiting history.   ____________________________________________   PHYSICAL EXAM:  VITAL SIGNS: ED Triage Vitals  Enc Vitals Group     BP 07/17/22 1621 108/73     Pulse Rate 07/17/22 1621 75     Resp 07/17/22 1621 14     Temp 07/17/22 1621 97.9 F (36.6 C)     Temp Source 07/17/22 1621 Oral     SpO2 07/17/22 1621 94 %     Weight 07/17/22 1605 149 lb (67.6 kg)     Height 07/17/22 1605 6' (1.829 m)   Constitutional: Disheveled and somewhat belligerent but redirectable. Eyes: Conjunctivae are normal.  Head: Atraumatic. Nose: No congestion/rhinnorhea. Mouth/Throat: Mucous membranes are moist.   Neck: No stridor.  Cardiovascular: Normal rate, regular rhythm. Good peripheral circulation. Grossly normal heart sounds.   Respiratory: Normal respiratory effort.  No retractions. Lungs CTAB. Gastrointestinal: Soft and nontender. No distention.  Musculoskeletal: No lower extremity tenderness nor edema. No gross deformities of extremities. Neurologic: Speech slurred but moving extremities equally.  Skin:  Skin is warm, dry and intact. No rash  noted.  ____________________________________________   LABS (all labs ordered are listed, but only abnormal results are displayed)  Labs Reviewed  COMPREHENSIVE METABOLIC PANEL - Abnormal; Notable for the following components:      Result Value   Chloride 112 (*)    Creatinine, Ser 0.60 (*)    Calcium 8.2 (*)    All other components within normal limits  ACETAMINOPHEN LEVEL - Abnormal; Notable for the following components:   Acetaminophen (Tylenol), Serum <10 (*)    All other components within normal limits  ETHANOL - Abnormal; Notable for the following components:   Alcohol, Ethyl (B) 410 (*)    All other components within normal limits  CBC WITH DIFFERENTIAL/PLATELET - Abnormal; Notable for the following components:   RBC 4.06 (*)    All other components within normal limits  SALICYLATE LEVEL - Abnormal; Notable for the following components:   Salicylate Lvl <7.0 (*)    All other components within normal limits  RAPID URINE DRUG SCREEN, HOSP PERFORMED - Abnormal; Notable for the following components:   Cocaine POSITIVE (*)    Benzodiazepines POSITIVE (*)    All other components within normal limits    ____________________________________________   PROCEDURES  Procedure(s) performed:   Procedures  None ____________________________________________   INITIAL IMPRESSION / ASSESSMENT AND PLAN / ED COURSE  Pertinent labs & imaging results that were available during my care of the patient were reviewed by me and considered in my medical decision making (see chart for details).   This patient is  Presenting for Evaluation of AMS, which does require a range of treatment options, and is a complaint that involves a high risk of morbidity and mortality.  The Differential Diagnoses includes but is not exclusive to alcohol, illicit or prescription medications, intracranial pathology such as stroke, intracerebral hemorrhage, fever or infectious causes including sepsis, hypoxemia,  uremia, trauma, endocrine related disorders such as diabetes, hypoglycemia, thyroid-related diseases, etc.  I did obtain Additional Historical Information from EMS.  I decided to review pertinent External Data, and in summary patient presents frequently to the emergency department with similar presentations.  Often arrives intoxicated.  Family does have history of seizure disorder and supposed to be on Keppra with poor compliance.  No stigmata of seizure witnessed seizure activity.   Clinical Laboratory Tests Ordered, included EtOH level elevated. UDS positive for cocaine and benzo as well. No AKI. No leukocytosis.   Radiologic Tests: Considered CT head imaging but no new appearing trauma. Patient clinically improving on reassessment. Defer imaging for now.   Social Determinants of Health Risk polysubstance abuse and EtOH abuse.   Medical Decision Making: Summary:  Patient presents to the emergency department with apparent homicidal ideation and anxiety.  He is sleeping on my initial assessment and appears drunk.  He has many ED presentations with alcohol abuse and substance-induced mood disorder but also carries diagnosis of schizophrenia according to chart review.  No stigmata of seizure.  No outward sign of head trauma to prompt CT imaging of the head.  Plan for screening blood work and will reassess once he is more sober to see if TTS needs to be involved in this case.   Reevaluation with update and discussion with patient. He is clinically sobering. Transient hypotension responded to IVF bolus. Care transferred to Dr. Bebe Shaggy pending reassessment when more sober.   Disposition: pending   ____________________________________________  FINAL CLINICAL IMPRESSION(S) / ED DIAGNOSES  Final diagnoses:  Alcohol abuse    Note:  This document was prepared using Dragon voice recognition software and may include unintentional dictation errors.  Alona Bene, MD, [facility] Emergency Medicine     Garion Wempe, Arlyss Repress, MD 07/22/22 1600

## 2022-07-18 LAB — RAPID URINE DRUG SCREEN, HOSP PERFORMED
Amphetamines: NOT DETECTED
Barbiturates: NOT DETECTED
Benzodiazepines: POSITIVE — AB
Cocaine: POSITIVE — AB
Opiates: NOT DETECTED
Tetrahydrocannabinol: NOT DETECTED

## 2022-07-18 MED ORDER — LACTATED RINGERS IV BOLUS
1000.0000 mL | Freq: Once | INTRAVENOUS | Status: AC
Start: 2022-07-18 — End: 2022-07-18
  Administered 2022-07-18: 1000 mL via INTRAVENOUS

## 2022-07-18 NOTE — ED Provider Notes (Signed)
Patient improved, ambulatory, no ataxia. Patient does not voice SI or HI. Patient appears sober and appropriate for discharge home   Gary Rhine, MD 07/18/22 [id number]

## 2022-07-18 NOTE — Discharge Instructions (Signed)
Substance Abuse Treatment Programs ° °Intensive Outpatient Programs °[facility]     °[address]      °[place], [place]                   °[phone]      ° °[facility] °[address] °[place], [place] °[phone] ° °[facility]     °(Inpatient and outpatient)     °[address]           °[phone]   ° °[facility] °[phone] (Suboxone and Methadone) ° °[address]      °[place], [place] [address]      °[phone]      ° °[address] °[place], [place] °852-3033 ° °[facility] (Outpatient/Inpatient, Chemical)    °(insurance only) [phone]      °       °[facility]Groups & Residential) °[place], [place] °[phone] ° °   °[facility]     °[address]     °[place], [place]      °[phone]      ° °[facility] (for caregivers and family) °[address] °[place], [place] °336-299-4655 ° ° ° ° ° °Residential Treatment Programs °[facility]      °[address], [place], [place] [address]  °[phone]      ° °[facility] °[address] [place], [place] [address] °[phone] ° °[facility]        °[phone]      ° °[facility] °[phone] ° °[facility]             °[address]                                         °[place], [place]                                                °[phone] or [phone]                              ° °[facility] [place] °[address] °[place], [address] °[phone] ° °[facility]    °[address]      °[place], [place]     °[phone]      ° °[facility] °[address] °[place], [place] °[phone] ° °[facility]   °[address]     °[place], [place] [address]     °[phone]      °Admissions: 8am-3pm M-F ° °[facility] °[address] °[place] °336-227-7417 ° °[facility]: Residential Program (90 Days)   °[place], [place]      °336-725-8389 or [phone]    ° °[facility] °[place],  °(Walk in Hours over the weekend or by referral) ° °[facility] °[address], [place],  [address] °[phone] ° °Crisis Mobile: [facility]:  1-877-626-1772 (for crisis response 24 hours a day) °[facility] Hotline:      1-800-256-2452 °Outpatient Psychiatry and Counseling ° °[facility]: Mobile Crisis   Management 24 hours:  1-877-626-1772 ° °[facility] sliding scale fee and walk in schedule: M-F 8am-12pm/1pm-3pm °[address]  °[place], [place] [address] °[phone] ° °[facility] °[address] °[place], [place] [address] °[phone] ° °[facility] ([facility] known [facility])- new patient walk-in appointments available Monday - Friday 8am -3pm.          °[address] °[place], [place] [address] °336-676-6840 or crisis line- 336-676-6905 ° °[facility]/ Intensive Outpatient Therapy Program °[address] °[place], [place] [address] °336-832-9804 ° °[facility]                  °Crisis Services      °[phone]      °[address]     °[place], [place] [address]                ° °[facility]   °[facility] °[phone] °[address] °[place], [place] [address] ° ° °[facility]          °[address],  °[place], [place] [address]       °[phone] ° °[facility] °[address] °[place], [place] [address] °336-292-1510 ° °[facility]    °[address]    °[place], [place] [address]     °[phone]      ° °Parish McKinney, MD     °[address]     °[place] [address]     °336-282-1251     °  °[facility] °[address] [place]  [address] °[place] [address] ° °[facility]     °[address]     °[place], [place]      °336-542-2076      ° °[facility] °Shamsher Ahluwalia, MD °[address] °[place], [place] [address] °[phone] ° °[facility]     °[address]     °[place], [place] [address]     °336-274-1237      ° °[facility] °[address] °[place], [place] [address] °336-854-4450 °Resources for Temporary Residential Assistance/Crisis Centers ° °DAY CENTERS °[facility] °M-F 8am-3pm   °[address] [place], [place] [address]   336-332-0824 °Services include: laundry, barbering, support groups, case management, phone  & computer access, showers, AA/NA mtgs, mental health/substance abuse nurse, job skills class, disability information, VA assistance, spiritual classes, etc.  ° °HOMELESS SHELTERS ° °[facility]     °[facility]   °[address], [place]     °[phone]       °       °[facility] (women and children)       °[address] °, [place] [address] °336-275-0820 °[email or web address] for application and process °Application Required ° °[facility]   °[address]    °[place] [address]     °[phone]       °             °[facility] °[address] °,  [address] °336.273.5572 °336-235-0363(schedule application appt.) °Application Required ° °[facility] (women only)    °[address]     °[place],  [address]     °336-884-1039      °  Intake starts 6pm daily °Need valid ID, SSC, & Police report °[facility] [place] °[address] °[place], [place] °336-881-5420 °Application Required ° °[facility] (men only)     °[address]      °[place], [place]     °[phone]      ° °[facility] °(Pregnant women only) °[address] °[place], [place] °336-275-0206 ° °[facility]      °[address]      °[place], [place] [address]     °[phone]      °       °[facility] °[address] °[place], [place] °336-723-1848 °90 day commitment/SA/Application process ° °[facility](men only)     °[address]     °[place], [place]     °[phone]       °Check-in at 7pm     °       °[facility] °[address] °[place],  [address] °336-248-6684 °Men/Women/Women and Children must be there by 7 pm ° °[facility] °[place],  °[phone]                ° °

## 2022-07-28 ENCOUNTER — Encounter (HOSPITAL_COMMUNITY): Payer: Self-pay

## 2022-07-28 ENCOUNTER — Other Ambulatory Visit: Payer: Self-pay

## 2022-07-28 ENCOUNTER — Emergency Department (HOSPITAL_COMMUNITY)
Admission: EM | Admit: 2022-07-28 | Discharge: 2022-07-29 | Disposition: A | Discharge status: Home / Self Care | Payer: Self-pay | Attending: Emergency Medicine | Admitting: Emergency Medicine

## 2022-07-28 DIAGNOSIS — R569 Unspecified convulsions: Secondary | ICD-10-CM | POA: Insufficient documentation

## 2022-07-28 DIAGNOSIS — F10920 Alcohol use, unspecified with intoxication, uncomplicated: Secondary | ICD-10-CM

## 2022-07-28 LAB — COMPREHENSIVE METABOLIC PANEL
ALT: 32 U/L (ref 0–44)
AST: 46 U/L — ABNORMAL HIGH (ref 15–41)
Albumin: 3.9 g/dL (ref 3.5–5.0)
Alkaline Phosphatase: 50 U/L (ref 38–126)
Anion gap: 13 (ref 5–15)
BUN: 8 mg/dL (ref 6–20)
CO2: 22 mmol/L (ref 22–32)
Calcium: 8.4 mg/dL — ABNORMAL LOW (ref 8.9–10.3)
Chloride: 107 mmol/L (ref 98–111)
Creatinine, Ser: 0.54 mg/dL — ABNORMAL LOW (ref 0.61–1.24)
GFR, Estimated: >60 mL/min (ref >60)
Glucose, Bld: 97 mg/dL (ref 70–99)
Potassium: 3.5 mmol/L (ref 3.5–5.1)
Sodium: 142 mmol/L (ref 135–145)
Total Bilirubin: 0.8 mg/dL (ref 0.3–1.2)
Total Protein: 6.6 g/dL (ref 6.5–8.1)

## 2022-07-28 LAB — CBC WITH DIFFERENTIAL/PLATELET
Abs Immature Granulocytes: 0.01 10*3/uL (ref 0.00–0.07)
Basophils Absolute: 0 10*3/uL (ref 0.0–0.1)
Basophils Relative: 1 %
Eosinophils Absolute: 0.2 10*3/uL (ref 0.0–0.5)
Eosinophils Relative: 7 %
HCT: 41.6 % (ref 39.0–52.0)
Hemoglobin: 13.8 g/dL (ref 13.0–17.0)
Immature Granulocytes: 0 %
Lymphocytes Relative: 48 %
Lymphs Abs: 1.4 10*3/uL (ref 0.7–4.0)
MCH: 32.9 pg (ref 26.0–34.0)
MCHC: 33.2 g/dL (ref 30.0–36.0)
MCV: 99.3 fL (ref 80.0–100.0)
Monocytes Absolute: 0.2 10*3/uL (ref 0.1–1.0)
Monocytes Relative: 7 %
Neutro Abs: 1.1 10*3/uL — ABNORMAL LOW (ref 1.7–7.7)
Neutrophils Relative %: 37 %
Platelets: 108 10*3/uL — ABNORMAL LOW (ref 150–400)
RBC: 4.19 MIL/uL — ABNORMAL LOW (ref 4.22–5.81)
RDW: 14.1 % (ref 11.5–15.5)
WBC: 3 10*3/uL — ABNORMAL LOW (ref 4.0–10.5)
nRBC: 0 % (ref 0.0–0.2)

## 2022-07-28 LAB — ETHANOL: Alcohol, Ethyl (B): 360 mg/dL (ref <10)

## 2022-07-28 MED ORDER — LEVETIRACETAM 500 MG PO TABS
1000.0000 mg | ORAL_TABLET | Freq: Once | ORAL | Status: AC
Start: 2022-07-28 — End: 2022-07-28
  Administered 2022-07-28: 1000 mg via ORAL
  Filled 2022-07-28: qty 2

## 2022-07-28 NOTE — ED Triage Notes (Signed)
Per EMS- Patient called EMS and stated he had had a seizure and drank " a shit ton of beer."

## 2022-07-28 NOTE — ED Provider Notes (Signed)
[facility] DEPT Provider Note   CSN: [id number] Arrival date & time: 07/28/22  1734     History  Chief Complaint  Patient presents with   Seizures   Alcohol Intoxication    Gary [name] is a 36 y.o. male.   Seizures Alcohol Intoxication   Patient with medical history of alcohol abuse, bipolar 2, PTSD, schizophrenia, seizures, anxiety presents today due to alcohol intoxication.  Patient states he is "drink a lot of alcohol".  States the large amount of beer he drinks is actually less than his normal baseline he is worried he may have had a withdrawal seizure.  He states has been taking his Keppra there is question about medical compliance.  He did not bite his tongue, but lose consciousness, hit his head or have any urinary incontinence.  He is not postictal on my exam.  Home Medications Prior to Admission medications   Medication Sig Start Date End Date Taking? Authorizing Provider  bictegravir-emtricitabine-tenofovir AF (BIKTARVY) 50-200-25 MG TABS tablet Take 1 tablet by mouth daily. 05/28/22   Alma Friendly, MD  chlordiazePOXIDE (LIBRIUM) 25 MG capsule 50mg  PO TID x 1D, then 25-50mg  PO BID X 1D, then 25-50mg  PO QD X 1D Patient not taking: Reported on 06/18/2022 06/12/22   Horton, Barbette Hair, MD  folic acid (FOLVITE) 1 MG tablet Take 1 tablet (1 mg total) by mouth daily. Patient not taking: Reported on 06/18/2022 05/01/22   Hosie Poisson, MD  gabapentin (NEURONTIN) 300 MG capsule Take 1 capsule (300 mg total) by mouth 3 (three) times daily. Patient taking differently: Take 900 mg by mouth 3 (three) times daily as needed (nerve pain). 05/01/22   Hosie Poisson, MD  levETIRAcetam (KEPPRA) 500 MG tablet Take 1 tablet (500 mg total) by mouth 2 (two) times daily. 07/07/22   Tedd Sias, PA  Multiple Vitamin (MULTIVITAMIN WITH MINERALS) TABS tablet Take 1 tablet by mouth daily. 05/02/22   Hosie Poisson, MD  pantoprazole (PROTONIX) 40 MG tablet Take  1 tablet (40 mg total) by mouth 2 (two) times daily. Patient not taking: Reported on 05/21/2022 05/01/22   Hosie Poisson, MD  thiamine 100 MG tablet Take 1 tablet (100 mg total) by mouth daily. Patient taking differently: Take 100 mg by mouth in the morning and at bedtime. 05/01/22   Hosie Poisson, MD  famotidine (PEPCID) 20 MG tablet Take 1 tablet (20 mg total) by mouth 2 (two) times daily. Patient not taking: Reported on 06/01/2019 05/14/19 06/02/19  Davonna Belling, MD      Allergies    Tegretol [carbamazepine] and Caffeine    Review of Systems   Review of Systems  Neurological:  Positive for seizures.    Physical Exam Updated Vital Signs BP 98/66   Pulse 69   Temp 98 F (36.7 C)   Resp 16   Ht 6' (1.829 m)   Wt 67.6 kg   SpO2 99%   BMI 20.21 kg/m  Physical Exam Vitals and nursing note reviewed. Exam conducted with a chaperone present.  Constitutional:      Appearance: Normal appearance.     Comments: Disheveled, somewhat belligerent  HENT:     Head: Normocephalic and atraumatic.     Comments: No hematoma or scalp laceration.  Patient has well-healing laceration to forehead with good approximation.    Mouth/Throat:     Comments: No malocclusion no tongue abrasion Eyes:     General: No scleral icterus.       Right  eye: No discharge.        Left eye: No discharge.     Extraocular Movements: Extraocular movements intact.     Pupils: Pupils are equal, round, and reactive to light.  Cardiovascular:     Rate and Rhythm: Normal rate and regular rhythm.     Pulses: Normal pulses.     Heart sounds: Normal heart sounds. No murmur heard.    No friction rub. No gallop.  Pulmonary:     Effort: Pulmonary effort is normal. No respiratory distress.     Breath sounds: Normal breath sounds.  Abdominal:     General: Abdomen is flat. Bowel sounds are normal. There is no distension.     Palpations: Abdomen is soft.     Tenderness: There is no abdominal tenderness.  Musculoskeletal:      Cervical back: Normal range of motion. No tenderness.  Skin:    General: Skin is warm and dry.     Coloration: Skin is not jaundiced.  Neurological:     General: No focal deficit present.     Mental Status: He is alert.     Coordination: Coordination normal.     Comments: slurring words.  Goal oriented speech, follows directions.  Cranial nerves II through XII are grossly intact.     ED Results / Procedures / Treatments   Labs (all labs ordered are listed, but only abnormal results are displayed) Labs Reviewed  CBC WITH DIFFERENTIAL/PLATELET - Abnormal; Notable for the following components:      Result Value   WBC 3.0 (*)    RBC 4.19 (*)    Platelets 108 (*)    Neutro Abs 1.1 (*)    All other components within normal limits  ETHANOL - Abnormal; Notable for the following components:   Alcohol, Ethyl (B) 360 (*)    All other components within normal limits  COMPREHENSIVE METABOLIC PANEL - Abnormal; Notable for the following components:   Creatinine, Ser 0.54 (*)    Calcium 8.4 (*)    AST 46 (*)    All other components within normal limits    EKG None  Radiology No results found.  Procedures .Suture Removal  Date/Time: 07/28/2022 6:48 PM  Performed by: Theron Arista, PA-C Authorized by: Theron Arista, PA-C   Consent:    Consent obtained:  Verbal   Consent given by:  Patient   Risks, benefits, and alternatives were discussed: yes     Risks discussed:  Bleeding, pain and wound separation   Alternatives discussed:  No treatment, delayed treatment and alternative treatment Universal protocol:    Procedure explained and questions answered to patient or proxy's satisfaction: yes     Relevant documents present and verified: yes     Test results available: yes     Imaging studies available: yes     Required blood products, implants, devices, and special equipment available: yes     Site/side marked: yes     Immediately prior to procedure, a time out was called: yes      Patient identity confirmed:  Verbally with patient and arm band Location:    Location:  Head/neck   Head/neck location:  Forehead Procedure details:    Wound appearance:  No signs of infection and good wound healing   Number of sutures removed:  7 Post-procedure details:    Post-removal:  No dressing applied   Procedure completion:  Tolerated well, no immediate complications     Medications Ordered in ED Medications  levETIRAcetam (  KEPPRA) tablet 1,000 mg (has no administration in time range)    ED Course/ Medical Decision Making/ A&P Clinical Course as of 07/28/22 2208  Tue Jul 28, 2022  2157 Intoxicated right now. Needs to metabolize prior to discharge.  [GL]    Clinical Course User Index [GL] Victorino Dike Finis Bud, PA-C                           Medical Decision Making Amount and/or Complexity of Data Reviewed Labs: ordered.  Risk Prescription drug management.   Patient presents due to alcohol intoxication and concern about seizures.  Differential includes breakthrough seizure, alcohol withdrawal, acute intoxication, electrolyte derangement, metabolic abnormality, hypoglycemia.  On exam patient is not postictal.  There is no focal deficits no lateralized weakness or numbness.  Follows commands, no signs of recent trauma.  He does have sutures to his forehead which were removed without complication with well approximated wound healing.  Clinically, he is intoxicated.  No signs of impending withdrawal. -BP 98/66   Pulse 69   Temp 98 F (36.7 C)   Resp 16   Ht 6' (1.829 m)   Wt 67.6 kg   SpO2 99%   BMI 20.21 kg/m   I reviewed external records, patient is very well-known here to the ED.  Comorbidity complicating his care include alcohol use disorder, HIV, polysubstance use disorder.  Social determinants of health-patient is homeless.  I ordered, viewed and interpreted laboratory work-up. - CBC with slight leukopenia of 3.0.  Also slight thrombocytopenia of 108.   Hemoglobin is within normal limits at 13.8. - CMP without gross electrolyte derangement, AKI or transaminitis.  AST is slightly elevated at 46 but roughly baseline.  Slight hypokalemia 3.0 but not significantly decreased from his baseline. - Ethanol 360  Reviewed patient's home medicine.  I ordered him to home dose of Keppra 1000 mg oral given he is tolerating p.o.  Patient with a level of 360, clinically he is clearly intoxicated.   We will observe patient until clinically sober.  Case discussed with PA Loeffler, plan is to discharge once clinically returned to baseline.        Final Clinical Impression(s) / ED Diagnoses Final diagnoses:  None    Rx / DC Orders ED Discharge Orders     None         Theron Arista, Cordelia Poche 07/28/22 2208    Derwood Kaplan, MD 07/29/22 1036

## 2022-07-28 NOTE — Discharge Instructions (Signed)
Resources provided for alcohol cessation and shelters.

## 2022-07-29 MED ORDER — LORAZEPAM 1 MG PO TABS
1.0000 mg | ORAL_TABLET | Freq: Once | ORAL | Status: AC
  Administered 2022-07-29: 1 mg via ORAL
  Filled 2022-07-29: qty 1

## 2022-07-29 NOTE — ED Provider Notes (Signed)
Accepted handoff at shift change from [place], [place]. Please see prior provider note for more detail.   Briefly: Patient is 36 y.o. male with a PMH of  alcohol abuse, bipolar 2, PTSD, schizophrenia, seizures, anxiety  who presents to the ED with a chief complaint of alcohol intoxication.  Patient states he is "drink a lot of alcohol".  States the large amount of beer he drinks is actually less than his normal baseline he is worried he may have had a withdrawal seizure.  He states has been taking his Keppra there is question about medical compliance.  He did not bite his tongue, but lose consciousness, hit his head or have any urinary incontinence.  He is not postictal on my exam..   Interventions: Keppra  Workup thus far reveals Stable laboratory findings. ETOH 360  DDX: concern for alcohol intoxication  Plan: Patient clinically intoxicated and not safe for discharge. No concern for seizures or active withdrawal. Plan is for patient to further metabolize and likely discharge.     Physical Exam  BP 106/73   Pulse 74   Temp 98 F (36.7 C)   Resp 16   Ht 6' (1.829 m)   Wt 67.6 kg   SpO2 100%   BMI 20.21 kg/m   Physical Exam Vitals and nursing note reviewed.  Constitutional:      General: He is not in acute distress.    Appearance: Normal appearance. He is well-developed. He is not ill-appearing, toxic-appearing or diaphoretic.  HENT:     Head: Normocephalic and atraumatic.     Nose: No nasal deformity.     Mouth/Throat:     Lips: Pink. No lesions.  Eyes:     General: Gaze aligned appropriately. No scleral icterus.       Right eye: No discharge.        Left eye: No discharge.     Conjunctiva/sclera: Conjunctivae normal.     Right eye: Right conjunctiva is not injected. No exudate or hemorrhage.    Left eye: Left conjunctiva is not injected. No exudate or hemorrhage. Pulmonary:     Effort: Pulmonary effort is normal. No respiratory distress.  Skin:    General: Skin is warm and  dry.  Neurological:     General: No focal deficit present.     Mental Status: He is alert and oriented to person, place, and time. Mental status is at baseline.  Psychiatric:        Mood and Affect: Mood normal.        Speech: Speech normal.        Behavior: Behavior normal. Behavior is cooperative.     Procedures  Procedures  ED Course / MDM   Clinical Course as of 07/29/22 0318  Tue Jul 28, 2022  2157 Intoxicated right now. Needs to metabolize prior to discharge.  [GL]  Wed Jul 29, 2022  0112 I reassessed this patient and clinically he has improved but still intoxicated. No evidence of withdrawal or seizures. Will reassess around 3 am and likely discharge [GL]    Clinical Course User Index [GL] Ilijah Doucet, Finis Bud, PA-C   Medical Decision Making Amount and/or Complexity of Data Reviewed Labs: ordered.  Risk Prescription drug management.   Patient here for alcohol intoxication. He has been monitored for over 8 hours and has showed no evidence of seizures or alcohol withdrawal. He is clinically sober. He is informed that we do not do alcohol detox here, but have provided him outpatient resources. He  is safe for discharge.        Sheila Oats 07/29/22 0319    Merrily Pew, MD 07/29/22 [phone]

## 2022-08-14 ENCOUNTER — Emergency Department (HOSPITAL_COMMUNITY)
Admission: EM | Admit: 2022-08-14 | Discharge: 2022-08-15 | Disposition: A | Discharge status: Home / Self Care | Payer: No Payment, Other | Attending: Emergency Medicine | Admitting: Emergency Medicine

## 2022-08-14 ENCOUNTER — Emergency Department (HOSPITAL_COMMUNITY): Payer: Self-pay

## 2022-08-14 ENCOUNTER — Other Ambulatory Visit: Payer: Self-pay

## 2022-08-14 ENCOUNTER — Encounter (HOSPITAL_COMMUNITY): Payer: Self-pay | Admitting: Emergency Medicine

## 2022-08-14 DIAGNOSIS — F1020 Alcohol dependence, uncomplicated: Secondary | ICD-10-CM | POA: Diagnosis present

## 2022-08-14 DIAGNOSIS — F10920 Alcohol use, unspecified with intoxication, uncomplicated: Secondary | ICD-10-CM | POA: Diagnosis present

## 2022-08-14 DIAGNOSIS — F10120 Alcohol abuse with intoxication, uncomplicated: Secondary | ICD-10-CM | POA: Insufficient documentation

## 2022-08-14 DIAGNOSIS — F332 Major depressive disorder, recurrent severe without psychotic features: Secondary | ICD-10-CM | POA: Diagnosis present

## 2022-08-14 DIAGNOSIS — F10929 Alcohol use, unspecified with intoxication, unspecified: Secondary | ICD-10-CM | POA: Diagnosis present

## 2022-08-14 DIAGNOSIS — F10229 Alcohol dependence with intoxication, unspecified: Secondary | ICD-10-CM | POA: Diagnosis present

## 2022-08-14 DIAGNOSIS — R4781 Slurred speech: Secondary | ICD-10-CM | POA: Insufficient documentation

## 2022-08-14 DIAGNOSIS — F10939 Alcohol use, unspecified with withdrawal, unspecified: Secondary | ICD-10-CM | POA: Diagnosis present

## 2022-08-14 DIAGNOSIS — F1010 Alcohol abuse, uncomplicated: Secondary | ICD-10-CM | POA: Diagnosis present

## 2022-08-14 DIAGNOSIS — Y908 Blood alcohol level of 240 mg/100 ml or more: Secondary | ICD-10-CM | POA: Insufficient documentation

## 2022-08-14 DIAGNOSIS — F10921 Alcohol use, unspecified with intoxication delirium: Secondary | ICD-10-CM | POA: Diagnosis present

## 2022-08-14 DIAGNOSIS — S0990XA Unspecified injury of head, initial encounter: Secondary | ICD-10-CM | POA: Insufficient documentation

## 2022-08-14 DIAGNOSIS — F10930 Alcohol use, unspecified with withdrawal, uncomplicated: Secondary | ICD-10-CM
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
## 2022-08-15 DIAGNOSIS — F332 Major depressive disorder, recurrent severe without psychotic features: Secondary | ICD-10-CM | POA: Diagnosis not present

## 2022-08-15 DIAGNOSIS — F10932 Alcohol use, unspecified with withdrawal with perceptual disturbance: Secondary | ICD-10-CM | POA: Diagnosis not present

## 2022-08-15 LAB — COMPREHENSIVE METABOLIC PANEL
ALT: 115 U/L — ABNORMAL HIGH (ref 0–44)
AST: 156 U/L — ABNORMAL HIGH (ref 15–41)
Albumin: 3.5 g/dL (ref 3.5–5.0)
Alkaline Phosphatase: 55 U/L (ref 38–126)
Anion gap: 11 (ref 5–15)
BUN: <5 mg/dL — ABNORMAL LOW (ref 6–20)
CO2: 27 mmol/L (ref 22–32)
Calcium: 9.8 mg/dL (ref 8.9–10.3)
Chloride: 104 mmol/L (ref 98–111)
Creatinine, Ser: 0.71 mg/dL (ref 0.61–1.24)
GFR, Estimated: >60 mL/min (ref >60)
Glucose, Bld: 93 mg/dL (ref 70–99)
Potassium: 3.5 mmol/L (ref 3.5–5.1)
Sodium: 142 mmol/L (ref 135–145)
Total Bilirubin: 0.4 mg/dL (ref 0.3–1.2)
Total Protein: 6.3 g/dL — ABNORMAL LOW (ref 6.5–8.1)

## 2022-08-15 LAB — CBC WITH DIFFERENTIAL/PLATELET
Abs Immature Granulocytes: 0.01 10*3/uL (ref 0.00–0.07)
Basophils Absolute: 0 10*3/uL (ref 0.0–0.1)
Basophils Relative: 1 %
Eosinophils Absolute: 0.1 10*3/uL (ref 0.0–0.5)
Eosinophils Relative: 4 %
HCT: 38.3 % — ABNORMAL LOW (ref 39.0–52.0)
Hemoglobin: 13.3 g/dL (ref 13.0–17.0)
Immature Granulocytes: 0 %
Lymphocytes Relative: 62 %
Lymphs Abs: 1.6 10*3/uL (ref 0.7–4.0)
MCH: 34.3 pg — ABNORMAL HIGH (ref 26.0–34.0)
MCHC: 34.7 g/dL (ref 30.0–36.0)
MCV: 98.7 fL (ref 80.0–100.0)
Monocytes Absolute: 0.2 10*3/uL (ref 0.1–1.0)
Monocytes Relative: 7 %
Neutro Abs: 0.7 10*3/uL — ABNORMAL LOW (ref 1.7–7.7)
Neutrophils Relative %: 26 %
Platelets: 99 10*3/uL — ABNORMAL LOW (ref 150–400)
RBC: 3.88 MIL/uL — ABNORMAL LOW (ref 4.22–5.81)
RDW: 14.1 % (ref 11.5–15.5)
WBC: 2.6 10*3/uL — ABNORMAL LOW (ref 4.0–10.5)
nRBC: 0 % (ref 0.0–0.2)

## 2022-08-15 LAB — ETHANOL: Alcohol, Ethyl (B): 341 mg/dL (ref <10)

## 2022-08-15 MED ORDER — PANTOPRAZOLE SODIUM 40 MG PO TBEC
40.0000 mg | DELAYED_RELEASE_TABLET | Freq: Two times a day (BID) | ORAL | Status: DC
  Administered 2022-08-15: 40 mg via ORAL
  Filled 2022-08-15: qty 1

## 2022-08-15 MED ORDER — LORAZEPAM 1 MG PO TABS
1.0000 mg | ORAL_TABLET | ORAL | Status: DC | PRN
  Administered 2022-08-15: 1 mg via ORAL
  Administered 2022-08-15: 3 mg via ORAL
  Filled 2022-08-15: qty 1
  Filled 2022-08-15: qty 3

## 2022-08-15 MED ORDER — GABAPENTIN 300 MG PO CAPS: 300.0000 mg | ORAL_CAPSULE | Freq: Three times a day (TID) | ORAL | Status: DC

## 2022-08-15 MED ORDER — FOLIC ACID 1 MG PO TABS
1.0000 mg | ORAL_TABLET | Freq: Every day | ORAL | Status: DC
  Administered 2022-08-15: 1 mg via ORAL
  Filled 2022-08-15: qty 1

## 2022-08-15 MED ORDER — THIAMINE MONONITRATE 100 MG PO TABS
100.0000 mg | ORAL_TABLET | Freq: Every day | ORAL | Status: DC
  Administered 2022-08-15: 100 mg via ORAL
  Filled 2022-08-15: qty 1

## 2022-08-15 MED ORDER — BICTEGRAVIR-EMTRICITAB-TENOFOV 50-200-25 MG PO TABS
1.0000 | ORAL_TABLET | Freq: Every day | ORAL | Status: DC
  Administered 2022-08-15: 1 via ORAL
  Filled 2022-08-15: qty 1

## 2022-08-15 MED ORDER — LEVETIRACETAM 500 MG PO TABS
500.0000 mg | ORAL_TABLET | Freq: Two times a day (BID) | ORAL | Status: DC
  Administered 2022-08-15: 500 mg via ORAL
  Filled 2022-08-15: qty 1

## 2022-08-15 MED ORDER — ADULT MULTIVITAMIN W/MINERALS CH
1.0000 | ORAL_TABLET | Freq: Every day | ORAL | Status: DC
  Administered 2022-08-15: 1 via ORAL
  Filled 2022-08-15: qty 1

## 2022-08-15 MED ORDER — LORAZEPAM 2 MG/ML IJ SOLN: 1.0000 mg | INTRAMUSCULAR | Status: DC | PRN

## 2022-08-15 MED ORDER — THIAMINE HCL 100 MG/ML IJ SOLN
100.0000 mg | Freq: Every day | INTRAMUSCULAR | Status: DC
  Filled 2022-08-15: qty 2

## 2022-08-15 MED ORDER — CHLORDIAZEPOXIDE HCL 25 MG PO CAPS: ORAL_CAPSULE | ORAL | 0 refills | Status: DC

## 2022-08-15 MED ORDER — CHLORDIAZEPOXIDE HCL 25 MG PO CAPS
50.0000 mg | ORAL_CAPSULE | Freq: Once | ORAL | Status: AC
  Administered 2022-08-15: 50 mg via ORAL
  Filled 2022-08-15: qty 2
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
## 2022-08-15 NOTE — ED Notes (Signed)
Pt left before getting his gabapentin.

## 2022-08-15 NOTE — Progress Notes (Signed)
CSW attempted to search for detox treatment facilities at the provider's request. CSW contacted [facility] and spoke with Hassan Rowan, reportedly, no beds available. Contacted [facility], no answer. Voicemail left for staff to contact CSW back.   Glennie Isle, MSW, [facility] Phone: [phone] Disposition/TOC
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
## 2022-08-15 NOTE — Discharge Instructions (Addendum)
Intensive Outpatient Programs  [facility]    [facility] [place] [address]     [place] [address] [place],  [place], [place]      [place]  (Inpatient and outpatient)  [phone] (Suboxone and Methadone) [address]           [phone]           ADS: Alcohol & Drug Services    [facility] - Intensive Outpatient [address]     [address] [place], [place] [address]     [place], [place]      [phone]  [facility] [place] [facility] (Outpatient, Inpatient, Chemical  [facility] (Groups and Residental) (insurance only) [phone]    [place], [place]          [phone]       [facility]    [facility] (for caregivers and family) [address]     [address] [place], [place], [place]      [phone]  Residential Treatment Programs  [place]  Work Farm(2 years) Residential: 90 days)  [facility] ([place].) [place], [place], [place] [phone]      [phone] or [phone]  [facility] [place]    [facility] [address]      [place], [place], [place]      [phone]  [place]   Residential Treatment Services (RTS) [place]     [address] [place], [place] [address]     [place], [place]      [phone] Admissions: 8am-3pm M-F  BATS Program: Residential Program [phone] Days)              ADATC: [facility]  [place], [place], [place] or [phone]    (Walk in Hours over the weekend or by referral)   Mobil Crisis: [facility]:[phone] (for crisis  response 24 hours a day)  Match medcation ASSIST   [place]  Name:  Gary [name] ID (MRN):  [id number] [place]: [id number]  RX Group: [id number]  Discharge Date:  08/15/22 Expiration Date:  10/14/2 3 (must be filled within 7 days of discharge)  Dear   Gary [name] have been approved to have the prescriptions written by your discharging physician filled through our [facility] (Medication Assistance Through [facility]) program. This program allows for a one-time (no refills) 34-day supply of selected medications for a low copay amount.    The copay is $3.00 per prescription. For instance, if you have one prescription, you will pay $3.00; for two prescriptions, you pay $6.00; for three prescriptions, you pay $9.00; and so on.    Only certain pharmacies are participating in this program with [facility]. You will need to select one of the pharmacies from the attached lists and take your prescriptions, this letter, and your photo ID to one of the participating pharmacies.     We are excited that  you are able to use the [facility] program to get your medications. These prescriptions must be filled within 7 days of hospital discharge or they will no longer be valid for the [facility] program. Should you have any problems with your prescriptions please contact your case management team member at [phone] for [place] or [phone] for [facility].    Thank you,    [facility]    [facility] Program Pharmacies  [place]  [facility]      [facility]  [address] [place], [place]  [address] [place], [place]  [address], [place], [employer]  3518 [place], [place] 130 [place]  [address] Warm [place], [address],  [place]  CVS (continued)  [address] [place]  9[address], [place]  [address], [place]  [address], [place]  [address]  [address] [place], [place]   [address] [place], [address]  Transitions of Care - [facility]                                 [address] [place]  [address] [place], [place]     CVS  [address], [place]  [address], [facility]  [address], [place]   [address], [place]  [address] [place], [place]  401 [place], [place]  [address], [place],   [address] [place], [place]  1615 [address] [place]  [address] [place], [place]  [address] [place] [place]  2210 [place], [place]  605 [place], [place]             [address] [place]  [address], [place]  [address]69 [address] [place], [address]  [facility]  [address], [place]  [address] [place], [place]  317 [place]  3611 [place], [place]  [address] [place], [place]  2403 [place], [place]  [address] [place], [place]    2841 [address] [place]  3529 [address], [place]    [address] [place] [address], [place]  [address], [place]  300 [address], [employer]  [address] [place]  3244 [place], [place]  [address]02 [address] [place]  2019 [place], [place]  [address] [place], [address]  [address], [employer]  904 [place], [place]   1040 [place], [place]  7253 [address], [place]   [address] [place], [place]  1628 [place], [place]  [address], [facility]  [address] [place]  [address] [place], [place]  4700 [place], [place] 1105 [place], [place]  [address] [place], [place]ne Sharper    [address], [place]  [facility] (continued)  [address] [place]  5005 [place], [place]  [address] [place]  [address], [place]  [address] [place]  [address] [place]  [address] [place], [place]  [address] [place]  [address], [place]  [address] [place], [place]  [address] [place],  [place]  [address] [place], [place]  [address] [place], [place]  3605 [place], [place]  [address] [place], [place]  [address]67 [address] [place], [place]  [address] [place], [place]  [address] [place], [place]  [address], [place]  [address], [place]  Other  [facility]                           [address] [place], [place]  [facility]                                    [address] [facility]                                      [address], [place]      [facility] Program Pharmacies  [facility]         CVS  [facility]

## 2022-08-15 NOTE — ED Notes (Signed)
TTS in use at this time. 

## 2022-08-15 NOTE — Progress Notes (Signed)
CSW attempted to reach out to [facility] in [place], inquiring about bed availability. It was reported that there are currently no beds available in there [place]. SA resources were added by ED TOC.  Glennie Isle, MSW, [place] Compton Phone: [phone] Disposition/TOC

## 2022-08-15 NOTE — ED Provider Notes (Signed)
Patient being treated for mild alcohol withdrawal at this time.  Otherwise medically cleared for psych evaluation.   Fransico Meadow, MD 08/15/22 [phone]

## 2022-08-15 NOTE — Care Management (Signed)
Added SA resources to discharge instructions and resources for PCP and [facility] on patient instructions.

## 2022-09-11 ENCOUNTER — Inpatient Hospital Stay (HOSPITAL_COMMUNITY)
Admission: EM | Admit: 2022-09-11 | Discharge: 2022-09-13 | DRG: 439 | Discharge status: Against Medical Advice | Payer: Self-pay | Attending: Internal Medicine | Admitting: Internal Medicine

## 2022-09-11 ENCOUNTER — Emergency Department (HOSPITAL_COMMUNITY): Payer: Self-pay

## 2022-09-11 ENCOUNTER — Encounter (HOSPITAL_COMMUNITY): Payer: Self-pay

## 2022-09-11 ENCOUNTER — Other Ambulatory Visit: Payer: Self-pay

## 2022-09-11 DIAGNOSIS — F1910 Other psychoactive substance abuse, uncomplicated: Secondary | ICD-10-CM | POA: Diagnosis present

## 2022-09-11 DIAGNOSIS — Z5329 Procedure and treatment not carried out because of patient's decision for other reasons: Secondary | ICD-10-CM | POA: Diagnosis present

## 2022-09-11 DIAGNOSIS — Z21 Asymptomatic human immunodeficiency virus [HIV] infection status: Secondary | ICD-10-CM | POA: Diagnosis present

## 2022-09-11 DIAGNOSIS — Y906 Blood alcohol level of 120-199 mg/100 ml: Secondary | ICD-10-CM | POA: Diagnosis present

## 2022-09-11 DIAGNOSIS — F209 Schizophrenia, unspecified: Secondary | ICD-10-CM | POA: Diagnosis present

## 2022-09-11 DIAGNOSIS — Z79899 Other long term (current) drug therapy: Secondary | ICD-10-CM

## 2022-09-11 DIAGNOSIS — Z91048 Other nonmedicinal substance allergy status: Secondary | ICD-10-CM

## 2022-09-11 DIAGNOSIS — Z5900 Homelessness unspecified: Secondary | ICD-10-CM

## 2022-09-11 DIAGNOSIS — F329 Major depressive disorder, single episode, unspecified: Secondary | ICD-10-CM | POA: Diagnosis present

## 2022-09-11 DIAGNOSIS — B20 Human immunodeficiency virus [HIV] disease: Secondary | ICD-10-CM | POA: Diagnosis present

## 2022-09-11 DIAGNOSIS — F10220 Alcohol dependence with intoxication, uncomplicated: Secondary | ICD-10-CM | POA: Diagnosis present

## 2022-09-11 DIAGNOSIS — E872 Acidosis, unspecified: Secondary | ICD-10-CM | POA: Diagnosis present

## 2022-09-11 DIAGNOSIS — F10229 Alcohol dependence with intoxication, unspecified: Secondary | ICD-10-CM | POA: Diagnosis present

## 2022-09-11 DIAGNOSIS — F10239 Alcohol dependence with withdrawal, unspecified: Secondary | ICD-10-CM | POA: Diagnosis present

## 2022-09-11 DIAGNOSIS — E162 Hypoglycemia, unspecified: Secondary | ICD-10-CM | POA: Diagnosis present

## 2022-09-11 DIAGNOSIS — D7589 Other specified diseases of blood and blood-forming organs: Secondary | ICD-10-CM | POA: Diagnosis present

## 2022-09-11 DIAGNOSIS — R739 Hyperglycemia, unspecified: Secondary | ICD-10-CM | POA: Diagnosis not present

## 2022-09-11 DIAGNOSIS — Z888 Allergy status to other drugs, medicaments and biological substances status: Secondary | ICD-10-CM

## 2022-09-11 DIAGNOSIS — F10939 Alcohol use, unspecified with withdrawal, unspecified: Secondary | ICD-10-CM

## 2022-09-11 DIAGNOSIS — K852 Alcohol induced acute pancreatitis without necrosis or infection: Principal | ICD-10-CM | POA: Diagnosis present

## 2022-09-11 DIAGNOSIS — R7401 Elevation of levels of liver transaminase levels: Secondary | ICD-10-CM | POA: Diagnosis present

## 2022-09-11 DIAGNOSIS — F1020 Alcohol dependence, uncomplicated: Secondary | ICD-10-CM | POA: Diagnosis present

## 2022-09-11 LAB — CBC WITH DIFFERENTIAL/PLATELET
Abs Immature Granulocytes: 0.05 10*3/uL (ref 0.00–0.07)
Basophils Absolute: 0 10*3/uL (ref 0.0–0.1)
Basophils Relative: 1 %
Eosinophils Absolute: 0.5 10*3/uL (ref 0.0–0.5)
Eosinophils Relative: 7 %
HCT: 48.3 % (ref 39.0–52.0)
Hemoglobin: 16 g/dL (ref 13.0–17.0)
Immature Granulocytes: 1 %
Lymphocytes Relative: 6 %
Lymphs Abs: 0.5 10*3/uL — ABNORMAL LOW (ref 0.7–4.0)
MCH: 33.8 pg (ref 26.0–34.0)
MCHC: 33.1 g/dL (ref 30.0–36.0)
MCV: 101.9 fL — ABNORMAL HIGH (ref 80.0–100.0)
Monocytes Absolute: 0.5 10*3/uL (ref 0.1–1.0)
Monocytes Relative: 6 %
Neutro Abs: 6.2 10*3/uL (ref 1.7–7.7)
Neutrophils Relative %: 79 %
Platelets: 164 10*3/uL (ref 150–400)
RBC: 4.74 MIL/uL (ref 4.22–5.81)
RDW: 14.7 % (ref 11.5–15.5)
WBC: 7.8 10*3/uL (ref 4.0–10.5)
nRBC: 0 % (ref 0.0–0.2)

## 2022-09-11 LAB — URINALYSIS, ROUTINE W REFLEX MICROSCOPIC
Bacteria, UA: NONE SEEN
Bilirubin Urine: NEGATIVE
Glucose, UA: NEGATIVE mg/dL
Hgb urine dipstick: NEGATIVE
Ketones, ur: 80 mg/dL — AB
Leukocytes,Ua: NEGATIVE
Nitrite: NEGATIVE
Protein, ur: 30 mg/dL — AB
Specific Gravity, Urine: 1.044 — ABNORMAL HIGH (ref 1.005–1.030)
pH: 5 (ref 5.0–8.0)

## 2022-09-11 LAB — COMPREHENSIVE METABOLIC PANEL
ALT: 112 U/L — ABNORMAL HIGH (ref 0–44)
ALT: 92 U/L — ABNORMAL HIGH (ref 0–44)
AST: 175 U/L — ABNORMAL HIGH (ref 15–41)
AST: 129 U/L — ABNORMAL HIGH (ref 15–41)
Albumin: 4.3 g/dL (ref 3.5–5.0)
Albumin: 3.6 g/dL (ref 3.5–5.0)
Alkaline Phosphatase: 75 U/L (ref 38–126)
Alkaline Phosphatase: 53 U/L (ref 38–126)
Anion gap: 18 — ABNORMAL HIGH (ref 5–15)
Anion gap: 18 — ABNORMAL HIGH (ref 5–15)
BUN: 11 mg/dL (ref 6–20)
BUN: 7 mg/dL (ref 6–20)
CO2: 19 mmol/L — ABNORMAL LOW (ref 22–32)
CO2: 17 mmol/L — ABNORMAL LOW (ref 22–32)
Calcium: 8.4 mg/dL — ABNORMAL LOW (ref 8.9–10.3)
Calcium: 8.1 mg/dL — ABNORMAL LOW (ref 8.9–10.3)
Chloride: 107 mmol/L (ref 98–111)
Chloride: 107 mmol/L (ref 98–111)
Creatinine, Ser: 0.74 mg/dL (ref 0.61–1.24)
Creatinine, Ser: 0.61 mg/dL (ref 0.61–1.24)
GFR, Estimated: >60 mL/min (ref >60)
GFR, Estimated: >60 mL/min (ref >60)
Glucose, Bld: 73 mg/dL (ref 70–99)
Glucose, Bld: 68 mg/dL — ABNORMAL LOW (ref 70–99)
Potassium: 4.2 mmol/L (ref 3.5–5.1)
Potassium: 4.3 mmol/L (ref 3.5–5.1)
Sodium: 144 mmol/L (ref 135–145)
Sodium: 142 mmol/L (ref 135–145)
Total Bilirubin: 0.8 mg/dL (ref 0.3–1.2)
Total Bilirubin: 1 mg/dL (ref 0.3–1.2)
Total Protein: 7.6 g/dL (ref 6.5–8.1)
Total Protein: 6.5 g/dL (ref 6.5–8.1)

## 2022-09-11 LAB — PROTIME-INR
INR: 1 (ref 0.8–1.2)
Prothrombin Time: 13.4 seconds (ref 11.4–15.2)

## 2022-09-11 LAB — LACTIC ACID, PLASMA
Lactic Acid, Venous: 4.7 mmol/L (ref 0.5–1.9)
Lactic Acid, Venous: 3.1 mmol/L (ref 0.5–1.9)
Lactic Acid, Venous: 3.2 mmol/L (ref 0.5–1.9)

## 2022-09-11 LAB — CBG MONITORING, ED: Glucose-Capillary: 66 mg/dL — ABNORMAL LOW (ref 70–99)

## 2022-09-11 LAB — LIPASE, BLOOD: Lipase: 722 U/L — ABNORMAL HIGH (ref 11–51)

## 2022-09-11 LAB — ETHANOL: Alcohol, Ethyl (B): 187 mg/dL — ABNORMAL HIGH (ref <10)

## 2022-09-11 MED ORDER — LEVETIRACETAM IN NACL 500 MG/100ML IV SOLN
500.0000 mg | Freq: Two times a day (BID) | INTRAVENOUS | Status: DC
  Administered 2022-09-11 – 2022-09-13 (×4): 500 mg via INTRAVENOUS
  Filled 2022-09-11 (×5): qty 100

## 2022-09-11 MED ORDER — HYDROMORPHONE HCL 1 MG/ML IJ SOLN
1.0000 mg | Freq: Once | INTRAMUSCULAR | Status: AC
  Administered 2022-09-11: 1 mg via INTRAVENOUS
  Filled 2022-09-11: qty 1

## 2022-09-11 MED ORDER — ONDANSETRON 4 MG PO TBDP: 4.0000 mg | ORAL_TABLET | Freq: Once | ORAL | Status: DC

## 2022-09-11 MED ORDER — LORAZEPAM 1 MG PO TABS: 1.0000 mg | ORAL_TABLET | ORAL | Status: DC | PRN

## 2022-09-11 MED ORDER — ONDANSETRON HCL 4 MG/2ML IJ SOLN
4.0000 mg | Freq: Once | INTRAMUSCULAR | Status: AC
  Administered 2022-09-11: 4 mg via INTRAVENOUS
  Filled 2022-09-11: qty 2

## 2022-09-11 MED ORDER — FOLIC ACID 5 MG/ML IJ SOLN
1.0000 mg | Freq: Every day | INTRAMUSCULAR | Status: DC
  Administered 2022-09-11 – 2022-09-12 (×2): 1 mg via INTRAVENOUS
  Filled 2022-09-11 (×3): qty 0.2

## 2022-09-11 MED ORDER — ADULT MULTIVITAMIN W/MINERALS CH: 1.0000 | ORAL_TABLET | Freq: Every day | ORAL | Status: DC

## 2022-09-11 MED ORDER — DEXTROSE 50 % IV SOLN
INTRAVENOUS | Status: AC
  Filled 2022-09-11: qty 50

## 2022-09-11 MED ORDER — PANTOPRAZOLE SODIUM 40 MG IV SOLR
40.0000 mg | Freq: Once | INTRAVENOUS | Status: AC
  Administered 2022-09-11: 40 mg via INTRAVENOUS
  Filled 2022-09-11: qty 10

## 2022-09-11 MED ORDER — HYDROMORPHONE HCL 1 MG/ML IJ SOLN
1.0000 mg | INTRAMUSCULAR | Status: DC | PRN
  Administered 2022-09-12 – 2022-09-13 (×7): 1 mg via INTRAVENOUS
  Filled 2022-09-11 (×7): qty 1

## 2022-09-11 MED ORDER — LACTATED RINGERS IV BOLUS
1000.0000 mL | Freq: Once | INTRAVENOUS | Status: AC
  Administered 2022-09-11: 1000 mL via INTRAVENOUS

## 2022-09-11 MED ORDER — LEVETIRACETAM 500 MG PO TABS: 1000.0000 mg | ORAL_TABLET | Freq: Once | ORAL | Status: DC

## 2022-09-11 MED ORDER — FOLIC ACID 1 MG PO TABS: 1.0000 mg | ORAL_TABLET | Freq: Every day | ORAL | Status: DC

## 2022-09-11 MED ORDER — HYDROMORPHONE HCL 1 MG/ML IJ SOLN
1.0000 mg | INTRAMUSCULAR | Status: DC | PRN
  Administered 2022-09-11: 1 mg via INTRAVENOUS
  Filled 2022-09-11: qty 1

## 2022-09-11 MED ORDER — ENOXAPARIN SODIUM 40 MG/0.4ML IJ SOSY
40.0000 mg | PREFILLED_SYRINGE | INTRAMUSCULAR | Status: DC
  Administered 2022-09-11 – 2022-09-12 (×2): 40 mg via SUBCUTANEOUS
  Filled 2022-09-11 (×2): qty 0.4

## 2022-09-11 MED ORDER — LACTATED RINGERS IV BOLUS: 1000.0000 mL | Freq: Once | INTRAVENOUS | Status: DC

## 2022-09-11 MED ORDER — LORAZEPAM 2 MG/ML IJ SOLN
2.0000 mg | Freq: Once | INTRAMUSCULAR | Status: AC
  Administered 2022-09-11: 2 mg via INTRAMUSCULAR
  Filled 2022-09-11: qty 1

## 2022-09-11 MED ORDER — THIAMINE HCL 100 MG/ML IJ SOLN
100.0000 mg | Freq: Every day | INTRAMUSCULAR | Status: DC
  Administered 2022-09-11 – 2022-09-13 (×3): 100 mg via INTRAVENOUS
  Filled 2022-09-11 (×3): qty 2

## 2022-09-11 MED ORDER — IOHEXOL 350 MG/ML SOLN
80.0000 mL | Freq: Once | INTRAVENOUS | Status: AC | PRN
  Administered 2022-09-11: 80 mL via INTRAVENOUS

## 2022-09-11 MED ORDER — HYDROCODONE-ACETAMINOPHEN 5-325 MG PO TABS: 1.0000 | ORAL_TABLET | Freq: Once | ORAL | Status: DC

## 2022-09-11 MED ORDER — THIAMINE MONONITRATE 100 MG PO TABS: 100.0000 mg | ORAL_TABLET | Freq: Every day | ORAL | Status: DC

## 2022-09-11 MED ORDER — LACTATED RINGERS IV SOLN: INTRAVENOUS | Status: DC

## 2022-09-11 MED ORDER — THIAMINE HCL 100 MG/ML IJ SOLN
100.0000 mg | Freq: Every day | INTRAMUSCULAR | Status: DC
  Administered 2022-09-11: 100 mg via INTRAVENOUS
  Filled 2022-09-11: qty 1
  Filled 2022-09-11: qty 2

## 2022-09-11 MED ORDER — LORAZEPAM 2 MG/ML IJ SOLN
1.0000 mg | INTRAMUSCULAR | Status: DC | PRN
  Administered 2022-09-11: 2 mg via INTRAVENOUS
  Administered 2022-09-11: 4 mg via INTRAVENOUS
  Administered 2022-09-12: 1 mg via INTRAVENOUS
  Administered 2022-09-12 (×2): 2 mg via INTRAVENOUS
  Filled 2022-09-11: qty 2
  Filled 2022-09-11 (×4): qty 1

## 2022-09-11 MED ORDER — SODIUM CHLORIDE 0.9 % IV BOLUS
1000.0000 mL | Freq: Once | INTRAVENOUS | Status: AC
  Administered 2022-09-11: 1000 mL via INTRAVENOUS

## 2022-09-11 NOTE — H&P (Incomplete)
Date: 09/11/2022               Patient Name:  Gary [name] MRN: [id number]  DOB: 14-Apr-1986 Age / Sex: 36 y.o., male   PCP: Pcp, No         Medical Service: Internal Medicine Teaching Service         Attending Physician: Dr. Aldine Contes, MD      First Contact: Dr. Johny Blamer, DO Pager [phone]    Second Contact: Dr. Buddy Duty, DO Pager [phone]         After Hours (After 5p/  First Contact Pager: [phone]  weekends / holidays): Second Contact Pager: [phone]   SUBJECTIVE   Chief Complaint: Abdominal Pain  History of Present Illness:  Encounter limited due to patient's somnolence secondary benzodiazepans, diazepam, and possible component of his withdrawal.    Gary [name] is a 36 year old male with past medical history significant for HIV, alcohol abuse, and alcohol withdrawal seizures who presents to the emergency department complaining of abdominal pain. Yesterday, he states he had 5L of vodka, and says that is his normal amount daily. This morning he stated he developed severe diffuse abdominal pain as well as nausea and vomiting. Unable to quanitfy amount of vomit or frequency, but from ED note no blood was observed. He says that he feels like the last time he had pancreatitis, which he was hospitalized for previously in June of this year secondary to alcohol use. His bowel movements have been normal. He denies any recent fevers, shortness of breath, or chest pain.    He has a prolonged alcohol use history, he started drinking as a teenager, and recently has been drinking 5L a day. He has had recurrent (7 in the last six months) visits to the emergency department for alcohol intoxication/abuse. He has never needed to be intubated for withdrawals, however has required transfer to ICU for Precedex due to alcohol withdrawals.   ED Course:  Pt was giving IV Protonix, dilaudid for pain, and zofran for nausea. LR were initiated, and he is currently s/p two boluses.  CT Abdomen revealed findings consistent with acute pancreatitis. Lactic acid elevated at 3.1, ehtanol elevated at 197, lipase elevated at 722.   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 Schizophrenia Major Depressive Disorder   Past Surgical History:  Procedure Laterality Date   BIOPSY  04/19/2022   Procedure: BIOPSY;  Surgeon: 19/09/2022., MD;  [facility];  Service: Gastroenterology;;   ENTEROSCOPY N/A 04/19/2022   Procedure: ENTEROSCOPY;  Surgeon: 19/09/2022., MD;  [facility] END[facility];  Service: Gastroenterology;  Laterality: N/A;   INCISION AND DRAINAGE PERIRECTAL ABSCESS N/A 09/24/2016   Procedure: IRRIGATION AND DEBRIDEMENT PERIRECTAL ABSCESS;  Surgeon: 10/07/2016, MD;  Location: [facility] ORS;  Service: General;  Laterality: N/A;   none      Social:  Lives With: Currently Homeless Occupation: N/A Support: Has friends in the area Level of Function: Able to perform all ADLs/IADLs himself PCP: N/A Substances: Started drinking as a teenager, currently drinks 5L of alcohol a day  Family History:  Unable to obtain due to somnolence   Allergies: Allergies as of 09/11/2022 - Review Complete 09/11/2022  Allergen Reaction Noted   Tegretol [carbamazepine] Other (See Comments) 06/07/2020   Caffeine Palpitations 06/15/2018  Review of Systems: A complete ROS was negative except as per HPI.   OBJECTIVE:   Physical Exam: Blood pressure (!) 129/98, pulse (!) 117, temperature 97.8 F (36.6 C), temperature source Oral, resp. rate (!) 24, height 6' (1.829 m), weight 68 kg, SpO2 100 %.  Constitutional: AAOx0, drowsy, confused,  looks older than stated age  HENT: normocephalic atraumatic, dry mucous membranes  Eyes: conjunctiva non-erythematous,  mild scleral icterus  Neck: supple Cardiovascular: Tachycardic, canon a waves noted Pulmonary/Chest: normal work of breathing on room air, lungs clear to auscultation bilaterally Abdominal: soft, non-tender, non-distended MSK: normal bulk and tone Neurological: alert & oriented x 0, bilateral tremor noted in both hands Skin: warm and dry   Labs: CBC    Component Value Date/Time   WBC 7.8 09/11/2022 1715   RBC 4.74 09/11/2022 1715   HGB 16.0 09/11/2022 1715   HGB 11.1 (L) 06/15/2018 1902   HCT 48.3 09/11/2022 1715   HCT 33.0 (L) 06/15/2018 1902   PLT 164 09/11/2022 1715   PLT 235 06/15/2018 1902   MCV 101.9 (H) 09/11/2022 1715   MCV 94 06/15/2018 1902   MCV 100 11/15/2014 1810   MCH 33.8 09/11/2022 1715   MCHC 33.1 09/11/2022 1715   RDW 14.7 09/11/2022 1715   RDW 13.8 06/15/2018 1902   RDW 13.8 11/15/2014 1810   LYMPHSABS 0.5 (L) 09/11/2022 1715   LYMPHSABS 1.0 06/15/2018 1902   MONOABS 0.5 09/11/2022 1715   EOSABS 0.5 09/11/2022 1715   EOSABS 0.0 06/15/2018 1902   BASOSABS 0.0 09/11/2022 1715   BASOSABS 0.0 06/15/2018 1902     CMP     Component Value Date/Time   NA 144 09/11/2022 1715   NA 138 11/15/2014 1810   K 4.2 09/11/2022 1715   K 4.1 11/15/2014 1810   CL 107 09/11/2022 1715   CL 100 11/15/2014 1810   CO2 19 (L) 09/11/2022 1715   CO2 30 11/15/2014 1810   GLUCOSE 73 09/11/2022 1715   GLUCOSE 115 (H) 11/15/2014 1810   BUN 11 09/11/2022 1715   BUN 6 (L) 11/15/2014 1810   CREATININE 0.74 09/11/2022 1715   CREATININE 0.93 03/20/2021 1641   CALCIUM 8.4 (L) 09/11/2022 1715   CALCIUM 9.4 11/15/2014 1810   PROT 7.6 09/11/2022 1715   PROT 8.6 (H) 11/15/2014 1810   ALBUMIN 4.3 09/11/2022 1715   ALBUMIN 4.7 11/15/2014 1810   AST 175 (H) 09/11/2022 1715   AST 42 (H) 11/15/2014 1810   ALT 112 (H) 09/11/2022 1715   ALT 61 11/15/2014 1810   ALKPHOS 75 09/11/2022 1715   ALKPHOS 64 11/15/2014 1810   BILITOT 0.8 09/11/2022 1715   BILITOT 0.4 11/15/2014 1810    GFRNONAA >60 09/11/2022 1715   GFRNONAA 107 03/20/2021 1641   GFRAA 124 03/20/2021 1641    Imaging: Moderate peripancreatic fluid consistent with acute pancreatitis. Focal hypoenhancing area at proximal body of pancreas consistent with fluid collection vs small area of necrosis  EKG: personally reviewed my interpretation is sinus tachycardia, no axis deviation,narrow complex QRS . Increased rate from prior EKG  ASSESSMENT & PLAN:   Assessment & Plan by Problem: Principal Problem:   Acute alcoholic pancreatitis   SOSAIA PITTINGER is a 36 y.o. person living with a history of HIV, alcohol abuse, and alcohol withdrawal seizures who presented with abdominal pain and admitted for acute pancreatitis secondary to alcohol abuse on hospital day 0  #Acute Pancreatitis Secondary to Alcohol Abuse #Mixed Alcoholic Ketosis, Starvation Ketosis Pt's lipase  is elevated at 722, diffuse abdominal pain, and CT findings are consistent with acute pancreatitis. Most likely brought on by his alcohol use. Currently drinks 5L a day of vodka, and has had admissions before for pancreatitis. During encounter, pt was drowsy and tremulous, likely due to ativan and dilaudid administration in the emergency department.   Initial labs showed an anion gap metabolic acidosis, and a LA of 3.2, likely due to a mix of lactic acidosis, alcoholic and starvation ketosis.  Plan:  - Dilaudid 1mg  Q4H for pain control  - 125cc LR continuous fluid  - Zofran 4mg  for nausea - If persistent pain, can repeat imaging to make certain that possible fluid is not worsening  - Start diet when able  #Alcohol Withdrawals Pt endorses a very heavy alcohol use history, and has had multiple emergency departments visits for intoxication, and a hospitalization for alcohol withdrawals as well. His last drink was yesterday, unable to state a time. Drinks vodka, and whatever he can get his hands on. He denied drinking toxic items outside of alcoholic  beverages.  Plan:  -CIWA w/ ativan Q1Hr PRN -Thiamine B1 supplementation - If improvement in transaminase levels, consider librium with PRN ativan to help control withdrawal - High risk of transfer to ICU given significant history of alcoholic seizures and intoxication   #Hx of seizures Pt has history of seizures and as of 8/29 (last admission) was supposed to be on Keppra 500mg  BID. Unsure if he is continuing to take his medication, or if he was able to get it refilled.   Plan:  - Keppra level F/U  - Keppra IV 500   #Hypoglycemia Secondary to Poor PO intake/Alcohol Abuse Pt had glucose of 66 two hours into admission, an amp was given and repeat sugar was 156.   Plan:  -Continue to monitor w/ glucose checks Q4H  #Polysubstance Abuse  Pt has known history of alcohol abuse, and stated he used crack cocaine recently Unable to ask more due to patient's state, however UDS and was positive for opiates and benzodiazepenes. UDS was obtained after administration of Ativan and Dilaudid. Per chart review, he was in a detox program a year and a half ago, but sobriety only lasted 14 days.  Plan:  - Counsel on cessation  #HIV Pt has known history of HIV, and had initially been following with [facility] Benita Stabile at [facility] for management, however has not been to his office in over a year and a half. At his last discharge, he was given Biktarvy, however has not been taking it in a while, per patient. He was supposed to be followed up with Dr. Gale Journey on 8/30, but did not show.   Plan:  -Restart biktarvy when pt is able to tolerate PO -F/U CD4 -F/U HIV-1 RNA  #Transaminitis On initial CMP, pt had elevated AST and ALT at 129 and 92 respectively, likely in the setting of alcohol use. Low suspicion for alcoholic hepatitis at this time.    Plan:  - Continue to monitor.  - Follow up PT/INR  #Homelessness Pt will most likely need TOC consult for shelter information.  #Schizophrenia  #Major  Depressive Disorder Pt has documented history of schizophrenia and major depressive disorder. He has seen behavioral health in the past, however does not look like treatment was initiated or followed up on.   Plan:  - Consider outpatient psych referral  #Macrocytosis  Initial labs revealed an MCV of 101.8, with normal Hb level. Likely in the setting of  alcohol abuse.   Diet: NPO VTE: Enoxaparin IVF: LR,125cc/hr Code: Full  Prior to Admission Living Arrangement: Homeless Anticipated Discharge Location: Pending TOC consult and resources   Dispo: Admit patient to Inpatient with expected length of stay greater than 2 midnights.  Signed: Drucie Opitz, MD Internal Medicine Resident PGY-1  09/11/2022, 8:22 PM   Dr. Drucie Opitz, MD Pager [phone]

## 2022-09-11 NOTE — ED Notes (Signed)
Admitting MD at bedside.

## 2022-09-11 NOTE — Discharge Instructions (Signed)
                  Intensive Outpatient Programs  [facility]    [facility] [address]     [address] [place],  [place]     [place], [place] [phone]      [phone]  [facility]   [facility]  (Inpatient and outpatient)  [phone] (Suboxone and Methadone) [address]           [phone]           ADS: Alcohol & Drug Services    [facility] - Intensive Outpatient [address]     [address] [place], [place] [address]     [place], [place]  [phone]      [phone]  [facility] (Outpatient, Inpatient, Chemical  [facility] (Groups and Residental) (insurance only) [phone]    [place], [place]          [phone]       [facility]    [facility] (for caregivers and family) [address]     [address] [place], [place]     [place], [place] [phone]      [phone]  Residential Treatment Programs  [facility]  Work Farm(2 years) Residential: 90 days)  [facility] [address]      [address] [place], [place]     [place], [place] [phone]      [phone] or [phone]  [facility]    [facility] [address]      [address] [place], [place]     [place], [place] [phone]      [phone]  [facility]   Residential Treatment Services (RTS) [address]     [address] [place], [place] [address]     [place], [place] [phone]      [phone] Admissions: 8am-3pm M-F  BATS Program: Residential Program (90 Days)              ADATC: [facility]  [place], [place]     [place], [place]  [phone] or [phone]    (Walk in Hours over the weekend or by referral)   Mobil Crisis: [facility]:[phone] (for crisis  response 24 hours a day) 

## 2022-09-11 NOTE — Care Management (Addendum)
SA resources on AVS. Patient has been reinstated 2 times for [facility] in a matter of months,  Did not follow up with ID from last month, or follow with PCP.

## 2022-09-11 NOTE — ED Notes (Signed)
MD notified of CBG 

## 2022-09-11 NOTE — ED Notes (Signed)
MD Pfeiffer notified of CIWA

## 2022-09-11 NOTE — ED Notes (Signed)
Pt unable to tolerate PO meds at this time due to vomiting. MD Pfeiffer informed.

## 2022-09-11 NOTE — ED Triage Notes (Signed)
Pt BIB [facility] c/o generalized abdominal pain, N/V. Pt has a hx of pancreatis and states he drunk too much straight vodka yesterday and the only reason he has not had a drink today is because he could not make it to the store to get more.

## 2022-09-11 NOTE — ED Notes (Signed)
Pfeiffer MD informed of critical lactic of 4.7

## 2022-09-11 NOTE — ED Provider Triage Note (Signed)
Emergency Medicine Provider Triage Evaluation Note  ROSSIE BRETADO , a 36 y.o. male  was evaluated in triage.  Pt complains of abdominal pain since AM.  Associated with nausea and vomiting. Hx of EtOH abuse, last drink was 1 day ago.  Review of Systems  Positive: Abdominal pain, N/V Negative: Chest pain, SOB, blood in vomit  Physical Exam  BP (!) 127/92 (BP Location: Right Arm)   Pulse (!) 110   Temp (!) 97.4 F (36.3 C) (Oral)   Resp 16   Ht 6' (1.829 m)   Wt 68 kg   SpO2 98%   BMI 20.34 kg/m  Gen:   Awake, tremulous Resp:  Normal effort  MSK:   Moves extremities without difficulty  Other:  +epigastric ttp  Medical Decision Making  Medically screening exam initiated at 2:52 PM.  Appropriate orders placed.  DEMETRIC DUNNAWAY was informed that the remainder of the evaluation will be completed by another provider, this initial triage assessment does not replace that evaluation, and the importance of remaining in the ED until their evaluation is complete.  Alerted triage RN concern for withdrawal, will need room for further monitoring.   Osvaldo Shipper, [place] 09/11/22 1607

## 2022-09-11 NOTE — ED Provider Notes (Signed)
[facility] EMERGENCY DEPARTMENT Provider Note   CSN: [id number] Arrival date & time: 09/11/22  1344     History  Chief Complaint  Patient presents with   Abdominal Pain   Emesis    Gary [name] is a 36 y.o. male.  HPI Patient reports severe abdominal pain this morning upon awakening.  He reports he has vomited all the stomach contents but was not sure if there was any blood.  He reports that he was drinking so much yesterday that he did not recognize that he had any abdominal pain yesterday.  He has not had any bowel movements that he recalls.  No fever that he recalls.  Now he has constant severe pain in his mid and upper abdomen as well as radiating somewhat to the chest.  Patient has history of significant pancreatitis.  He reports he has had history of similar pain.    Home Medications Prior to Admission medications   Medication Sig Start Date End Date Taking? Authorizing Provider  bictegravir-emtricitabine-tenofovir AF (BIKTARVY) 50-200-25 MG TABS tablet Take 1 tablet by mouth daily. 05/28/22   Alma Friendly, MD  chlordiazePOXIDE (LIBRIUM) 25 MG capsule 50mg  PO TID x 1D, then 25-50mg  PO BID X 1D, then 25-50mg  PO QD X 1D 08/15/22   Fransico Meadow, MD  folic acid (FOLVITE) 1 MG tablet Take 1 tablet (1 mg total) by mouth daily. Patient not taking: Reported on 06/18/2022 05/01/22   Hosie Poisson, MD  gabapentin (NEURONTIN) 300 MG capsule Take 1 capsule (300 mg total) by mouth 3 (three) times daily. Patient not taking: Reported on 08/15/2022 05/01/22   Hosie Poisson, MD  levETIRAcetam (KEPPRA) 500 MG tablet Take 1 tablet (500 mg total) by mouth 2 (two) times daily. 07/07/22   Tedd Sias, PA  Multiple Vitamin (MULTIVITAMIN WITH MINERALS) TABS tablet Take 1 tablet by mouth daily. 05/02/22   Hosie Poisson, MD  pantoprazole (PROTONIX) 40 MG tablet Take 1 tablet (40 mg total) by mouth 2 (two) times daily. Patient not taking: Reported on 05/21/2022 05/01/22    Hosie Poisson, MD  thiamine 100 MG tablet Take 1 tablet (100 mg total) by mouth daily. Patient not taking: Reported on 08/15/2022 05/01/22   Hosie Poisson, MD  famotidine (PEPCID) 20 MG tablet Take 1 tablet (20 mg total) by mouth 2 (two) times daily. Patient not taking: Reported on 06/01/2019 05/14/19 06/02/19  Davonna Belling, MD      Allergies    Tegretol [carbamazepine] and Caffeine    Review of Systems   Review of Systems  Physical Exam Updated Vital Signs BP (!) 127/92 (BP Location: Right Arm)   Pulse (!) 110   Temp (!) 97.4 F (36.3 C) (Oral)   Resp 16   Ht 6' (1.829 m)   Wt 68 kg   SpO2 98%   BMI 20.34 kg/m  Physical Exam Constitutional:      Comments: Patient is thin.  Appears to be in severe pain.  No respiratory distress.  HENT:     Mouth/Throat:     Pharynx: Oropharynx is clear.  Eyes:     Extraocular Movements: Extraocular movements intact.  Cardiovascular:     Pulses: Normal pulses.     Comments: Borderline tachycardia.  No gross or murmur gallop. Pulmonary:     Effort: Pulmonary effort is normal.     Breath sounds: Normal breath sounds.  Abdominal:     Comments: Patient endorses severe pain to palpation in the epigastrium and  mid abdomen.  Cannot assess for guarding as he is keeping his knees constantly flexed and moaning.  Abdomen is not distended.  Musculoskeletal:        General: No swelling. Normal range of motion.     Right lower leg: No edema.     Left lower leg: No edema.  Skin:    General: Skin is warm and dry.  Neurological:     General: No focal deficit present.     Mental Status: He is oriented to person, place, and time.     Motor: No weakness.     Coordination: Coordination normal.     Comments: Patient is situationally oriented.  He is slightly tremulous.  He can follow commands although due to pain is not brisk to do so.  He is moving all extremities symmetrically.  He is drawing up his legs in a fetal position.  No evidence of any focal  weakness or paralysis.  Psychiatric:     Comments: Very anxious and expressing a lot of pain.     ED Results / Procedures / Treatments   Labs (all labs ordered are listed, but only abnormal results are displayed) Labs Reviewed  COMPREHENSIVE METABOLIC PANEL  LIPASE, BLOOD  CBC WITH DIFFERENTIAL/PLATELET  URINALYSIS, ROUTINE W REFLEX MICROSCOPIC  ETHANOL  RAPID URINE DRUG SCREEN, HOSP PERFORMED  LACTIC ACID, PLASMA  LACTIC ACID, PLASMA  PROTIME-INR    EKG EKG Interpretation  Date/Time:  Friday September 11 2022 15:50:51 EDT Ventricular Rate:  108 PR Interval:  126 QRS Duration: 98 QT Interval:  361 QTC Calculation: 484 R Axis:   57 Text Interpretation: Sinus tachycardia rate increase and artifact but no sig change from previous. Confirmed by Arby Barrette [phone]) on 09/11/2022 7:46:49 PM  Radiology No results found.  Procedures Procedures   CRITICAL CARE Performed by: Arby Barrette   Total critical care time: 60 minutes  Critical care time was exclusive of separately billable procedures and treating other patients.  Critical care was necessary to treat or prevent imminent or life-threatening deterioration.  Critical care was time spent personally by me on the following activities: development of treatment plan with patient and/or surrogate as well as nursing, discussions with consultants, evaluation of patient's response to treatment, examination of patient, obtaining history from patient or surrogate, ordering and performing treatments and interventions, ordering and review of laboratory studies, ordering and review of radiographic studies, pulse oximetry and re-evaluation of patient's condition.  Medications Ordered in ED Medications  sodium chloride 0.9 % bolus 1,000 mL (has no administration in time range)  levETIRAcetam (KEPPRA) tablet 1,000 mg (has no administration in time range)  LORazepam (ATIVAN) tablet 1-4 mg (has no administration in time range)     Or  LORazepam (ATIVAN) injection 1-4 mg (has no administration in time range)  thiamine (VITAMIN B1) tablet 100 mg (has no administration in time range)    Or  thiamine (VITAMIN B1) injection 100 mg (has no administration in time range)  folic acid (FOLVITE) tablet 1 mg (has no administration in time range)  multivitamin with minerals tablet 1 tablet (has no administration in time range)  LORazepam (ATIVAN) injection 2 mg (has no administration in time range)  HYDROcodone-acetaminophen (NORCO/VICODIN) 5-325 MG per tablet 1 tablet (has no administration in time range)  ondansetron (ZOFRAN-ODT) disintegrating tablet 4 mg (has no administration in time range)  pantoprazole (PROTONIX) injection 40 mg (has no administration in time range)  HYDROmorphone (DILAUDID) injection 1 mg (has no administration in time  range)  ondansetron (ZOFRAN) injection 4 mg (has no administration in time range)  lactated ringers bolus 1,000 mL (has no administration in time range)    ED Course/ Medical Decision Making/ A&P                           Medical Decision Making Amount and/or Complexity of Data Reviewed Labs: ordered. Radiology: ordered.  Risk Prescription drug management. Decision regarding hospitalization.   Patient presents as outlined with severe abdominal pain.  He has known history of pancreatitis and heavy alcohol use.  Patient reports large amount of alcohol use yesterday and severe epigastric pain today.  Differential diagnosis includes pancreatitis\peptic ulcer disease\ulcer perforation\gastritis\bowel obstruction\hepatitis or biliary colic.  We will proceed with IV Protonix for high suspicion of alcoholic gastritis or peptic ulcer disease.  We will proceed with Dilaudid and Zofran for pain and nausea.  Lactated Ringer's initiated for fluid resuscitation.  Work-up to include CT abdomen pelvis to rule out surgical intra-abdominal etiology.  CT abdomen per radiology interpretation shows  inflammatory changes around the pancreas consistent with pancreatitis.  Lipase elevated at 722 and lactic acid 3.2.  We will plan for admission with active pancreatitis and vomiting.  Patient also has significant risk for alcohol withdrawal.  He does drink heavily and regularly.  Patient will need admission for management of complex medical conditions.  Consult: Internal medicine residency teaching service for admission.        Final Clinical Impression(s) / ED Diagnoses Final diagnoses:  Alcohol-induced acute pancreatitis, unspecified complication status  Alcohol withdrawal syndrome with complication [facility])    Rx / DC Orders ED Discharge Orders     None         Arby Barrette, MD 10/03/22 [phone]

## 2022-09-11 NOTE — ED Notes (Signed)
Unsuccessful blood draw by this 2 RN's x2. Phlebotomy asked to attempt.

## 2022-09-12 DIAGNOSIS — R739 Hyperglycemia, unspecified: Secondary | ICD-10-CM

## 2022-09-12 DIAGNOSIS — E8889 Other specified metabolic disorders: Secondary | ICD-10-CM

## 2022-09-12 LAB — CBC WITH DIFFERENTIAL/PLATELET
Abs Immature Granulocytes: 0.01 10*3/uL (ref 0.00–0.07)
Basophils Absolute: 0 10*3/uL (ref 0.0–0.1)
Basophils Relative: 1 %
Eosinophils Absolute: 0.1 10*3/uL (ref 0.0–0.5)
Eosinophils Relative: 2 %
HCT: 40.4 % (ref 39.0–52.0)
Hemoglobin: 13.4 g/dL (ref 13.0–17.0)
Immature Granulocytes: 0 %
Lymphocytes Relative: 11 %
Lymphs Abs: 0.6 10*3/uL — ABNORMAL LOW (ref 0.7–4.0)
MCH: 33.8 pg (ref 26.0–34.0)
MCHC: 33.2 g/dL (ref 30.0–36.0)
MCV: 101.8 fL — ABNORMAL HIGH (ref 80.0–100.0)
Monocytes Absolute: 0.5 10*3/uL (ref 0.1–1.0)
Monocytes Relative: 8 %
Neutro Abs: 4.6 10*3/uL (ref 1.7–7.7)
Neutrophils Relative %: 78 %
Platelets: 130 10*3/uL — ABNORMAL LOW (ref 150–400)
RBC: 3.97 MIL/uL — ABNORMAL LOW (ref 4.22–5.81)
RDW: 14.7 % (ref 11.5–15.5)
WBC: 5.9 10*3/uL (ref 4.0–10.5)
nRBC: 0 % (ref 0.0–0.2)

## 2022-09-12 LAB — RAPID URINE DRUG SCREEN, HOSP PERFORMED
Amphetamines: NOT DETECTED
Barbiturates: NOT DETECTED
Benzodiazepines: POSITIVE — AB
Cocaine: NOT DETECTED
Opiates: POSITIVE — AB
Tetrahydrocannabinol: NOT DETECTED

## 2022-09-12 LAB — COMPREHENSIVE METABOLIC PANEL
ALT: 76 U/L — ABNORMAL HIGH (ref 0–44)
AST: 91 U/L — ABNORMAL HIGH (ref 15–41)
Albumin: 3.6 g/dL (ref 3.5–5.0)
Alkaline Phosphatase: 53 U/L (ref 38–126)
Anion gap: 17 — ABNORMAL HIGH (ref 5–15)
BUN: 6 mg/dL (ref 6–20)
CO2: 22 mmol/L (ref 22–32)
Calcium: 8.5 mg/dL — ABNORMAL LOW (ref 8.9–10.3)
Chloride: 102 mmol/L (ref 98–111)
Creatinine, Ser: 0.81 mg/dL (ref 0.61–1.24)
GFR, Estimated: >60 mL/min (ref >60)
Glucose, Bld: 80 mg/dL (ref 70–99)
Potassium: 4.3 mmol/L (ref 3.5–5.1)
Sodium: 141 mmol/L (ref 135–145)
Total Bilirubin: 1.6 mg/dL — ABNORMAL HIGH (ref 0.3–1.2)
Total Protein: 6.1 g/dL — ABNORMAL LOW (ref 6.5–8.1)

## 2022-09-12 LAB — CBG MONITORING, ED
Glucose-Capillary: 100 mg/dL — ABNORMAL HIGH (ref 70–99)
Glucose-Capillary: 156 mg/dL — ABNORMAL HIGH (ref 70–99)

## 2022-09-12 LAB — LACTIC ACID, PLASMA
Lactic Acid, Venous: 0.7 mmol/L (ref 0.5–1.9)
Lactic Acid, Venous: 0.8 mmol/L (ref 0.5–1.9)

## 2022-09-12 LAB — GLUCOSE, CAPILLARY
Glucose-Capillary: 90 mg/dL (ref 70–99)
Glucose-Capillary: 86 mg/dL (ref 70–99)

## 2022-09-12 LAB — PROTIME-INR
INR: 1.1 (ref 0.8–1.2)
Prothrombin Time: 14.4 seconds (ref 11.4–15.2)

## 2022-09-12 LAB — MAGNESIUM: Magnesium: 1.2 mg/dL — ABNORMAL LOW (ref 1.7–2.4)

## 2022-09-12 LAB — PHOSPHORUS: Phosphorus: 3.6 mg/dL (ref 2.5–4.6)

## 2022-09-12 MED ORDER — CHLORDIAZEPOXIDE HCL 5 MG PO CAPS: 25.0000 mg | ORAL_CAPSULE | Freq: Three times a day (TID) | ORAL | Status: DC

## 2022-09-12 MED ORDER — ONDANSETRON 4 MG PO TBDP
4.0000 mg | ORAL_TABLET | Freq: Four times a day (QID) | ORAL | Status: DC | PRN
  Administered 2022-09-12 – 2022-09-13 (×2): 4 mg via ORAL
  Filled 2022-09-12 (×2): qty 1

## 2022-09-12 MED ORDER — HYDROXYZINE HCL 25 MG PO TABS: 25.0000 mg | ORAL_TABLET | Freq: Four times a day (QID) | ORAL | Status: DC | PRN

## 2022-09-12 MED ORDER — ADULT MULTIVITAMIN W/MINERALS CH
1.0000 | ORAL_TABLET | Freq: Every day | ORAL | Status: DC
  Administered 2022-09-13: 1 via ORAL
  Filled 2022-09-12: qty 1

## 2022-09-12 MED ORDER — LORAZEPAM 1 MG PO TABS
1.0000 mg | ORAL_TABLET | ORAL | Status: DC | PRN
  Administered 2022-09-12: 1 mg via ORAL
  Filled 2022-09-12: qty 1

## 2022-09-12 MED ORDER — CHLORDIAZEPOXIDE HCL 5 MG PO CAPS: 25.0000 mg | ORAL_CAPSULE | Freq: Every day | ORAL | Status: DC

## 2022-09-12 MED ORDER — LOPERAMIDE HCL 2 MG PO CAPS: 2.0000 mg | ORAL_CAPSULE | ORAL | Status: DC | PRN

## 2022-09-12 MED ORDER — CHLORDIAZEPOXIDE HCL 5 MG PO CAPS
50.0000 mg | ORAL_CAPSULE | Freq: Four times a day (QID) | ORAL | Status: AC
  Administered 2022-09-12 – 2022-09-13 (×4): 50 mg via ORAL
  Filled 2022-09-12 (×3): qty 10
  Filled 2022-09-12: qty 2

## 2022-09-12 MED ORDER — CHLORDIAZEPOXIDE HCL 5 MG PO CAPS
50.0000 mg | ORAL_CAPSULE | Freq: Three times a day (TID) | ORAL | Status: DC
  Administered 2022-09-13: 50 mg via ORAL
  Filled 2022-09-12: qty 10

## 2022-09-12 MED ORDER — CHLORDIAZEPOXIDE HCL 5 MG PO CAPS: 25.0000 mg | ORAL_CAPSULE | Freq: Four times a day (QID) | ORAL | Status: DC | PRN

## 2022-09-12 MED ORDER — CHLORDIAZEPOXIDE HCL 5 MG PO CAPS: 25.0000 mg | ORAL_CAPSULE | Freq: Two times a day (BID) | ORAL | Status: DC

## 2022-09-12 MED ORDER — THIAMINE HCL 100 MG/ML IJ SOLN: 100.0000 mg | Freq: Once | INTRAMUSCULAR | Status: DC

## 2022-09-12 MED ORDER — MAGNESIUM SULFATE 2 GM/50ML IV SOLN
2.0000 g | Freq: Once | INTRAVENOUS | Status: AC
  Administered 2022-09-12: 2 g via INTRAVENOUS
  Filled 2022-09-12: qty 50

## 2022-09-12 NOTE — Progress Notes (Signed)
HD#1 Subjective:   Summary: Gary [name] is a 36 year old male with a past medical history of HIV, alcohol use disorder, prior alcohol withdrawal seizures who presented to the emergency department with abdominal pain and is admitted for acute pancreatitis.  Overnight Events: Admitted overnight please see H&P.  This morning patient is still symptomatic with normal pain, nausea, and vomiting.  He does not think that it is gotten any better but denies any worsening.  He repeatedly fell asleep during our visit but was easily aroused.  Objective:  Vital signs in last 24 hours: Vitals:   09/12/22 0315 09/12/22 0345 09/12/22 0600 09/12/22 0630  BP: 109/76 109/77 120/88 (!) 140/89  Pulse: (!) 122 (!) 120 (!) 112 (!) 126  Resp: (!) 22 (!) 25 20 (!) 21  Temp:      TempSrc:      SpO2: 98% 98% 98% 100%  Weight:      Height:       Supplemental O2: Room Air SpO2: 100 %   Physical Exam:  Constitutional: Thin ill-appearing male, appears older than stated age, in no acute distress HENT: normocephalic atraumatic, mucous membranes moist, no tongue fasciculations Eyes: No scleral icterus Cardiovascular: Tachycardic with regular rhythm Pulmonary/Chest: normal work of breathing on room air, lungs clear to auscultation bilaterally Abdominal: soft, nondistended, tender to palpation diffusely but predominantly in the epigastrium Neurological: alert and easily arousable but frequently fell asleep Skin: warm and dry  Filed Weights   09/11/22 1443  Weight: 68 kg     Intake/Output Summary (Last 24 hours) at 09/12/2022 0640 Last data filed at 09/11/2022 2207 Gross per 24 hour  Intake 2100 ml  Output --  Net 2100 ml   Net IO Since Admission: 2,100 mL [09/12/22 0640]  Pertinent Labs:    Latest Ref Rng & Units 09/12/2022    4:49 AM 09/11/2022    5:15 PM 08/14/2022   10:15 PM  CBC  WBC 4.0 - 10.5 K/uL 5.9  7.8  2.6   Hemoglobin 13.0 - 17.0 g/dL 13.4  16.0  13.3   Hematocrit 39.0 - 52.0 %  40.4  48.3  38.3   Platelets 150 - 400 K/uL 130  164  99        Latest Ref Rng & Units 09/12/2022    4:49 AM 09/11/2022    9:00 PM 09/11/2022    5:15 PM  CMP  Glucose 70 - 99 mg/dL 80  68  73   BUN 6 - 20 mg/dL 6  7  11    Creatinine 0.61 - 1.24 mg/dL 0.81  0.61  0.74   Sodium 135 - 145 mmol/L 141  142  144   Potassium 3.5 - 5.1 mmol/L 4.3  4.3  4.2   Chloride 98 - 111 mmol/L 102  107  107   CO2 22 - 32 mmol/L 22  17  19    Calcium 8.9 - 10.3 mg/dL 8.5  8.1  8.4   Total Protein 6.5 - 8.1 g/dL 6.1  6.5  7.6   Total Bilirubin 0.3 - 1.2 mg/dL 1.6  1.0  0.8   Alkaline Phos 38 - 126 U/L 53  53  75   AST 15 - 41 U/L 91  129  175   ALT 0 - 44 U/L 76  92  112     Imaging: CT Abdomen Pelvis W Contrast  Result Date: 09/11/2022 CLINICAL DATA:  Abdomen pain EXAM: CT ABDOMEN AND PELVIS WITH CONTRAST TECHNIQUE: Multidetector CT  imaging of the abdomen and pelvis was performed using the standard protocol following bolus administration of intravenous contrast. RADIATION DOSE REDUCTION: This exam was performed according to the departmental dose-optimization program which includes automated exposure control, adjustment of the mA and/or kV according to patient size and/or use of iterative reconstruction technique. CONTRAST:  25mL OMNIPAQUE IOHEXOL 350 MG/ML SOLN COMPARISON:  CT 04/25/2022, 04/16/2022, 07/11/2021 FINDINGS: Lower chest: Lung bases demonstrate no acute airspace disease. Chronic elevation of left diaphragm Hepatobiliary: Hepatic steatosis. No calcified gallstone or biliary dilatation. Pancreas: Moderate peripancreatic fluid consistent with acute pancreatitis. Focal hypoenhancing area at the proximal body of pancreas, series 3, image 30. No ductal dilatation Spleen: Normal in size without focal abnormality. Adrenals/Urinary Tract: Adrenal glands are unremarkable. Kidneys are normal, without renal calculi, focal lesion, or hydronephrosis. Bladder is unremarkable. Stomach/Bowel: Stomach is within normal  limits. Appendix appears normal. No evidence of bowel wall thickening, distention, or inflammatory changes. Vascular/Lymphatic: Nonaneurysmal aorta. Patent portal and splenic veins. No suspicious lymph nodes Reproductive: Prostate is unremarkable. Other: No free air. Moderate fluid in the right anterior pararenal space. Musculoskeletal: No acute or significant osseous findings. IMPRESSION: 1. Findings consistent with acute pancreatitis. Focal hypoenhancing area at the proximal body of pancreas could represent focal small developing/organizing fluid collection versus a small area of necrosis. 2. Hepatic steatosis. Electronically Signed   By: Donavan Foil M.D.   On: 09/11/2022 19:25    Assessment/Plan:   Principal Problem:   Acute alcoholic pancreatitis   Patient Summary: Gary [name] is a 36 y.o. with a pertinent PMH of past medical history of HIV, alcohol use disorder, prior alcohol withdrawal seizures who presented to the emergency department with abdominal pain and is admitted for acute pancreatitis.   Acute alcoholic pancreatitis Alcohol use disorder Symptoms are stable or slightly improved with the addition of Dilaudid and fluids.  Remains tachycardic but afebrile and normotensive.  No leukocytosis, lactic acid normalized.  We will continue treatment for acute pancreatitis and continue to monitor. - Dilaudid 1 mg every 4 hours as needed - Continue moderate fluid resuscitation with LR - Zofran as needed - Consider repeat imaging if persistent pain through the day - Start early feeding when able  Alcohol withdrawal History of seizures Last drink on 09/10/2022 with daily habit of 5 L of vodka.  History of hospital visits and admissions for alcohol withdrawal and 1 ICU visit for Precedex but no intubations.  With his history of seizures he has been prescribed Keppra 500 mg twice daily but is unsure if he is currently on this.  He is at high risk for ICU transfer due to his history of severe  alcohol withdrawals. - Librium taper with CIWA and Librium for breakthrough - Thiamine and multivitamin - Keppra level pending - Keppra 500 mg IV twice daily  Ketosis and hyperglycemia secondary to poor p.o. intake/alcohol use Macrocytosis without anemia Polysubstance use disorder Hypomagnesemia Transaminitis Houseless  Anion gap still elevated at 17 with bicarb normal at 22.  Glucose stable between 80 and 150 after given dextrose.  AST and ALT are trending down but still elevated about twice the upper limit of normal.  PT and INR within normal limits.  MCV remains elevated and is likely secondary to chronic alcohol use. - Continue to monitor glucose q4h - Continue to monitor CMP - Counsel on substance use and TOC consult   HIV No current signs of opportunistic infections but unknown if patient has been adherent to [employer]. - Resume Biktarvy when  able to tolerate p.o. - Follow-up CD4/HIV-1 RNA  Schizophrenia Major depressive disorder - Consider outpatient psych referral  Diet: NPO, will feed when patient is able IVF: LR,125cc/hr VTE: Enoxaparin Code: Full TOC recs: pending   Dispo: Anticipated discharge to Home in 2-4 days pending resolution of pancreatitis and stability of alcohol withdrawal.   Johny Blamer DO Internal Medicine Resident PGY-1 Pager: [phone]  Please contact the on call pager after 5 pm and on weekends at [phone].

## 2022-09-12 NOTE — Progress Notes (Signed)
Paged by RN, patient wanting to leave AMA. Per nursing, patient had signed AMA paperwork and was preparing to leave. IVs had been removed.   Patient seen and evaluated in room. He was standing up getting dressed, looking for boots. States that he wanted to leave because his birthday is coming up and he does not want to be in the hospital on his birthday. He also reports that he does not feel as though his withdrawal symptoms are being adequately managed.  Discussed risk of withdrawal if he were to leave AMA including seizure and possibly death. He says he understands that he is at risk of withdrawing but does not think that he will die.  He says that he feels as though  he could eat, and is not complaining of pain at this time.   On exam, patient is calm and cooperative. He is reasonable and able to hold conversation, but does appear to be tremulous. Appears mildly anxious as well.   After discussions with the patient, he is agreeable to staying the night. He does appear to still have some symptoms of withdrawal despite librium taper and is still at risk of serious complications from withdrawal should he leave AMA at this time. It will be important to continue to treat his symptoms as we are able while he remains here.   - We will continue the librium taper. - Ativan 1mg  q4h PRN added for anxiety

## 2022-09-12 NOTE — Progress Notes (Signed)
Pt admitted to 4E from ED.  CHG bath completed. TElemetry applied, CCMD notified.  Pt oriented to room.  Calm and cooperative, complaining of 9/10 abdominal/L flank pain.  Will address as per orders.

## 2022-09-12 NOTE — ED Notes (Signed)
MD notified of CBG recheck

## 2022-09-12 NOTE — ED Notes (Signed)
Pt appears to be resting. Pt laying in bed with eyes closed, chest rise and fall visualized, NAD noted at this time

## 2022-09-12 NOTE — ED Notes (Signed)
Md at bedside. Pt tolerated drinking water

## 2022-09-12 NOTE — Progress Notes (Signed)
Pt has agreed to stay for now. Administered PO Ativan per MD PRN order. Asked patient if he would allow me to replace tele box at this time he stated he would not wear it at this time.

## 2022-09-12 NOTE — Hospital Course (Addendum)
Pain in whole belly Shaking Pain is not any better since he got here; feels about the same Falling asleep intermittently during interview Hurts to take a deep breath    L hand resting tremor  Tender to palpation RUq, epigastric , and LUQ Asterixis

## 2022-09-13 LAB — BASIC METABOLIC PANEL
Anion gap: 12 (ref 5–15)
BUN: 6 mg/dL (ref 6–20)
CO2: 25 mmol/L (ref 22–32)
Calcium: 8.4 mg/dL — ABNORMAL LOW (ref 8.9–10.3)
Chloride: 98 mmol/L (ref 98–111)
Creatinine, Ser: 0.63 mg/dL (ref 0.61–1.24)
GFR, Estimated: >60 mL/min (ref >60)
Glucose, Bld: 69 mg/dL — ABNORMAL LOW (ref 70–99)
Potassium: 3.5 mmol/L (ref 3.5–5.1)
Sodium: 135 mmol/L (ref 135–145)

## 2022-09-13 LAB — CBC
HCT: 37.7 % — ABNORMAL LOW (ref 39.0–52.0)
Hemoglobin: 12.9 g/dL — ABNORMAL LOW (ref 13.0–17.0)
MCH: 34.3 pg — ABNORMAL HIGH (ref 26.0–34.0)
MCHC: 34.2 g/dL (ref 30.0–36.0)
MCV: 100.3 fL — ABNORMAL HIGH (ref 80.0–100.0)
Platelets: 105 10*3/uL — ABNORMAL LOW (ref 150–400)
RBC: 3.76 MIL/uL — ABNORMAL LOW (ref 4.22–5.81)
RDW: 14.3 % (ref 11.5–15.5)
WBC: 4.1 10*3/uL (ref 4.0–10.5)
nRBC: 0 % (ref 0.0–0.2)

## 2022-09-13 LAB — GLUCOSE, CAPILLARY
Glucose-Capillary: 78 mg/dL (ref 70–99)
Glucose-Capillary: 81 mg/dL (ref 70–99)

## 2022-09-13 MED ORDER — BICTEGRAVIR-EMTRICITAB-TENOFOV 50-200-25 MG PO TABS
1.0000 | ORAL_TABLET | Freq: Every day | ORAL | Status: DC
  Filled 2022-09-13: qty 1

## 2022-09-13 NOTE — Progress Notes (Signed)
HD#2 SUBJECTIVE:  Patient Summary: Gary [name] is a 36 y.o. with a pertinent PMH of HIV and alcohol use disorder, who presented with abdominal pain and admitted for acute pancreatitis 2/2 alcohol use and alcohol withdrawal.   Overnight Events: Patient wanted to leave AMA overnight, however, was agreeable to stay after discussion with night team. Felt that his withdrawal symptoms were not well controlled.   Interim History: Patient was evaluated at the bedside this morning. He was quite sleepy, however, he denies any anxiousness or tremors. He is hungry and does have some nausea, but denies any vomiting or abd pain.   OBJECTIVE:  Vital Signs: Vitals:   09/12/22 2040 09/12/22 2042 09/13/22 0010 09/13/22 0447  BP: (!) 130/99 (!) 130/99 (!) 122/92 126/89  Pulse: (!) 111 94 95 88  Resp: 20 20 20 20   Temp: 99.2 F (37.3 C) 99.2 F (37.3 C) 99.3 F (37.4 C) 98.6 F (37 C)  TempSrc: Oral Oral Oral Oral  SpO2: 99% 99% 96% 98%  Weight:      Height:       Supplemental O2: Room Air SpO2: 98 %  Filed Weights   09/11/22 1443 09/12/22 1440  Weight: 68 kg 68.6 kg     Intake/Output Summary (Last 24 hours) at 09/13/2022 4235 Last data filed at 09/13/2022 0300 Gross per 24 hour  Intake 3590.78 ml  Output 450 ml  Net 3140.78 ml   Net IO Since Admission: 5,240.78 mL [09/13/22 0939]  Physical Exam: General: Thin, chronically ill appearing male laying in bed. No acute distress. CV: RRR. No murmurs. No LE edema Pulmonary: Lungs CTAB. Normal work of breathing on room air Abdominal: Soft, nondistended. Mildly tender to palpation. Normal bowel sounds. Extremities: Normal bulk and tones. Neuro: A&Ox3. Easily awakened, but falls asleep during interview frequently. Psych: Normal mood and affect    ASSESSMENT/PLAN:  Assessment: Principal Problem:   Acute alcoholic pancreatitis   Plan: #Acute pancreatitis 2/2 alcohol use Symptoms are improved from yesterday and patient has been  able to tolerate some po intake. No leukocytosis on labs and vitals are stable. - Continue to encourage po intake - Dilaudid 1 mg q4h prn for pain - Zofran q6h prn for nausea, vomiting  #Alcohol withdrawal Last drink was the evening of 11/2 with daily habit of about 5 L of vodka. This evening will be 72 hours since last drink, and patient is high risk to have delirium tremens/severe alcohol withdrawal. Started on librium taper with 50 mg four times daily on 11/4. Of note, patient would like to discharge home on/before his birthday tomorrow. Discussed that he is not medically stable at this time for discharge and I strongly encourage him to stay - he is agreeable.  - Continue librium taper: librium 50 mg tid today (11/5), 25 mg tid 11/6, 25 mg bid 11/7, and 25 mg once on 11/8 - CIWA monitoring q6h - Ativan 1 mg q4h prn for tremors, anxiety - Imodium 2-4 mg prn - Keppra 500 mg bid for seizure prophylaxis  - Continue thiamine and multivitamin  #HIV - Restart Biktarvy now that patient is tolerating po intake - RNA quant in process  #Anion gap metabolic acidosis, resolved #Ketosis #Macrocytosis Anion gap normal at 12 and bicarb remains within normal limits at 25. Patient noted to have MCV Of 100.3, without anemia on CBC.  - Continue thiamine and multivitamin as above - Continue to encourage po intake  Best Practice: Diet: Regular diet IVF: Fluids: none VTE: enoxaparin (  LOVENOX) injection 40 mg Start: 09/11/22 2030 Code: Full AB: none DISPO: Anticipated discharge in 1-2 days to Home pending Medical stability.  Signature: Elza Rafter, D.O.  Internal Medicine Resident, PGY-2 [name]  Pager: [phone] 9:39 AM, 09/13/2022   Please contact the on call pager after 5 pm and on weekends at [phone].

## 2022-09-13 NOTE — Progress Notes (Signed)
Pt seen by medical team. Desires to leave. IV removed.  New AMA form signed.  Pt asking about a hat but there is no hat in the room and he was not wearing a hat when he arrived to the floor last night as I can attest as I received him in the room last night.  Pt walked off 4E independently, guided to elevators by staff.

## 2022-09-13 NOTE — Progress Notes (Signed)
Pt would like to leave AMA citing it "took three hrs for breakfast to arrive".  MD paged will be at bedside soon.  AMA form has been signed since last night.

## 2022-09-13 NOTE — Discharge Summary (Signed)
Name: Gary [name] MRN: [id number] DOB: Feb 20, 1986 36 y.o. PCP: Pcp, No  Date of Admission: 09/11/2022  2:23 PM Date of AMA Discharge: 09/13/2022 09/13/22 Attending Physician: Dr. Heide Spark  DISCHARGE DIAGNOSIS:  Primary Problem: Acute alcoholic pancreatitis   Hospital Problems: Principal Problem:   Acute alcoholic pancreatitis Active Problems:   HIV disease (HCC)   Polysubstance abuse (HCC)   Alcohol use disorder, severe, dependence (HCC)    DISCHARGE MEDICATIONS:   Allergies as of 09/13/2022       Reactions   Tegretol [carbamazepine] Other (See Comments)   Caused vertigo for 2 days after taking it   Caffeine Palpitations        Medication List     STOP taking these medications    chlordiazePOXIDE 25 MG capsule Commonly known as: LIBRIUM       TAKE these medications    Biktarvy 50-200-25 MG Tabs tablet Generic drug: bictegravir-emtricitabine-tenofovir AF Take 1 tablet by mouth daily.   CertaVite/Antioxidants Tabs Take 1 tablet by mouth daily.   folic acid 1 MG tablet Commonly known as: FOLVITE Take 1 tablet (1 mg total) by mouth daily.   gabapentin 300 MG capsule Commonly known as: NEURONTIN Take 1 capsule (300 mg total) by mouth 3 (three) times daily.   levETIRAcetam 500 MG tablet Commonly known as: KEPPRA Take 1 tablet (500 mg total) by mouth 2 (two) times daily.   pantoprazole 40 MG tablet Commonly known as: PROTONIX Take 1 tablet (40 mg total) by mouth 2 (two) times daily.   thiamine 100 MG tablet Commonly known as: VITAMIN B1 Take 1 tablet (100 mg total) by mouth daily.        DISPOSITION AND FOLLOW-UP:  Gary [name] LEFT AMA from [facility] in Serious condition. At the hospital follow up visit please address:  Follow-up Recommendations: Consults: would recommend follow up with ID Labs:  none Studies: none Medications: Left AMA before librium taper was completed, no new medications  prescribed  Follow-up Appointments:  Follow-up Information     [facility] Follow up.   Why: call and set up a appointment to establish a primary care provider.they  have finacial assistance and a pharmacy Contact information: [address] [place] [address] [phone]        [facility]             . Call.   Why: FOLLOW UP, call and make an appointment Contact information: [address] [place] [address]                HOSPITAL COURSE:  Patient Summary: Acute alcoholic pancreatitis Alcohol use disorder Patient presented to hospital with severe abdominal pain and nausea and vomiting with decreased oral intake and was found to have an elevated lipase of 722 as well as CT findings consistent with acute pancreatitis.  Etiology of his pancreatitis is likely secondary to his alcohol use. Initially had an elevated lactic acid up to 3.2 which has since resolved. Symptoms are stable or slightly improved after receiving dilaudid and IV fluids. Patient was able to tolerate some po intake by the time he left AMA.   Alcohol withdrawal History of seizures Patient with history of heavy alcohol use and has had multiple ED visits for intoxication as well as a hospitalization for alcohol withdrawal.  Patient's last alcohol use was approximately 2 days ago (11/2 evening) and his alcohol  level was elevated here to 341 on presentation to ED with improvement to 187 upon recheck. Last drink on 09/10/2022 with daily habit of 5 L of vodka. He has a history of hospital visits and admissions for alcohol withdrawal and 1 ICU visit for Precedex but no intubation was required.  With his history of seizures he has been prescribed Keppra 500 mg twice daily but unsure if he is truly taking this.   Patient was started on a librium taper and continued to receive as needed ativan for signs  of alcohol withdrawal, however, on 11/5, the patient opted to leave AMA. We discussed the risks of leaving the hospital at this time, as the patient is nearly 72 hours out from his last drink and he is at high risk of delirium tremens and possibly death, however, he is adamant that he is going to leave. The patient has decision making capacity and states that he leaves AMA from [facility] every time he feels well enough to do so and he does not want to stay any longer. He also states that he is not going to go through alcohol withdrawal, as he is going to go home and drink more alcohol. The patient understands the risks of leaving AMA.   HIV No current signs of opportunistic infections but unknown if patient has been adherent to [employer]. CD-4 count and RNA quant were still in process by the time the patient left.    DISCHARGE INSTRUCTIONS:  Dear Gary [name],  You were hospitalized because you had pancreatitis that was likely caused by your alcohol use and you were also hospitalized for alcohol withdrawal. We gave you IV fluids and pain medications for your pancreatitis and by the time you left, you were able to eat and drink better than when you came in. We recommended that you stay in the hospital for alcohol withdrawal, as you are at a very high risk of experiencing alcohol withdrawal seizures/uncontrolled shaking, fevers, severe sweating, hallucinations, delirium/feeling confused about where you are or who you are, and possibly even death. These symptoms can occur 2-4 days after a person's last drink, and your last drink was 3 days ago. Please return to the emergency room as soon as possible if you begin to experience any of these symptoms.   SUBJECTIVE:  Patient states that he feels better and wants to leave the hospital AMA. He is upset his breakfast came 3 hours after he ordered it. He wishes to go home and drink alcohol.   Discharge Vitals:   BP 112/81 (BP Location: Left Arm)   Pulse 92   Temp  98.6 F (37 C) (Oral)   Resp 19   Ht 6' (1.829 m)   Wt 68.6 kg   SpO2 100%   BMI 20.51 kg/m   OBJECTIVE:  General: Thin, chronically ill appearing male laying in bed. No acute distress. CV: RRR. No murmurs. No LE edema Pulmonary: Lungs CTAB. Normal work of breathing on room air Abdominal: Soft, nondistended. Mildly tender to palpation. Normal bowel sounds. Extremities: Normal bulk and tones. Neuro: A&Ox3. No focal deficit. Demonstrates decision making capacity.  Psych: Upset mood and affect.  Pertinent Labs, Studies, and Procedures:     Latest Ref Rng & Units 09/13/2022    1:24 AM 09/12/2022    4:49 AM 09/11/2022    5:15 PM  CBC  WBC 4.0 - 10.5 K/uL 4.1  5.9  7.8   Hemoglobin 13.0 - 17.0 g/dL 12.9  13.4  16.0  Hematocrit 39.0 - 52.0 % 37.7  40.4  48.3   Platelets 150 - 400 K/uL 105  130  164        Latest Ref Rng & Units 09/13/2022    1:24 AM 09/12/2022    4:49 AM 09/11/2022    9:00 PM  CMP  Glucose 70 - 99 mg/dL 69  80  68   BUN 6 - 20 mg/dL 6  6  7    Creatinine 0.61 - 1.24 mg/dL  6.31  4.97   Sodium 135 - 145 mmol/L 135  141  142   Potassium 3.5 - 5.1 mmol/L 3.5  4.3  4.3   Chloride 98 - 111 mmol/L 98  102  107   CO2 22 - 32 mmol/L 25  22  17    Calcium 8.9 - 10.3 mg/dL 8.4  8.5  8.1   Total Protein 6.5 - 8.1 g/dL  6.1  6.5   Total Bilirubin 0.3 - 1.2 mg/dL  1.6  1.0   Alkaline Phos 38 - 126 U/L  53  53   AST 15 - 41 U/L  91  129   ALT 0 - 44 U/L  76  92     CT Abdomen Pelvis W Contrast  Result Date: 09/11/2022 CLINICAL DATA:  Abdomen pain EXAM: CT ABDOMEN AND PELVIS WITH CONTRAST TECHNIQUE: Multidetector CT imaging of the abdomen and pelvis was performed using the standard protocol following bolus administration of intravenous contrast. RADIATION DOSE REDUCTION: This exam was performed according to the departmental dose-optimization program which includes automated exposure control, adjustment of the mA and/or kV according to patient size and/or use of  iterative reconstruction technique. CONTRAST:  45mL OMNIPAQUE IOHEXOL 350 MG/ML SOLN COMPARISON:  CT 04/25/2022, 04/16/2022, 07/11/2021 FINDINGS: Lower chest: Lung bases demonstrate no acute airspace disease. Chronic elevation of left diaphragm Hepatobiliary: Hepatic steatosis. No calcified gallstone or biliary dilatation. Pancreas: Moderate peripancreatic fluid consistent with acute pancreatitis. Focal hypoenhancing area at the proximal body of pancreas, series 3, image 30. No ductal dilatation Spleen: Normal in size without focal abnormality. Adrenals/Urinary Tract: Adrenal glands are unremarkable. Kidneys are normal, without renal calculi, focal lesion, or hydronephrosis. Bladder is unremarkable. Stomach/Bowel: Stomach is within normal limits. Appendix appears normal. No evidence of bowel wall thickening, distention, or inflammatory changes. Vascular/Lymphatic: Nonaneurysmal aorta. Patent portal and splenic veins. No suspicious lymph nodes Reproductive: Prostate is unremarkable. Other: No free air. Moderate fluid in the right anterior pararenal space. Musculoskeletal: No acute or significant osseous findings. IMPRESSION: 1. Findings consistent with acute pancreatitis. Focal hypoenhancing area at the proximal body of pancreas could represent focal small developing/organizing fluid collection versus a small area of necrosis. 2. Hepatic steatosis. Electronically Signed   By: 06/16/2022 M.D.   On: 09/11/2022 19:25     Signed: Jasmine Pang, D.O.  Internal Medicine Resident, PGY-2 13/01/2022 Internal Medicine Residency  Pager: [phone] 11:44 AM, 09/13/2022

## 2022-09-13 NOTE — Progress Notes (Signed)
Patient refused skin assessment citing'' I am irritated'. Care continues.

## 2022-09-14 LAB — HIV-1 RNA QUANT-NO REFLEX-BLD
HIV 1 RNA Quant: 2230 copies/mL
LOG10 HIV-1 RNA: 3.348 log10copy/mL

## 2022-09-14 LAB — LEVETIRACETAM LEVEL: Levetiracetam Lvl: <2 ug/mL — ABNORMAL LOW (ref 10.0–40.0)

## 2022-09-16 ENCOUNTER — Encounter (HOSPITAL_COMMUNITY): Payer: Self-pay | Admitting: *Deleted

## 2022-09-16 ENCOUNTER — Emergency Department (HOSPITAL_COMMUNITY)
Admission: EM | Admit: 2022-09-16 | Discharge: 2022-09-17 | Disposition: A | Discharge status: Home / Self Care | Payer: Self-pay | Attending: Emergency Medicine | Admitting: Emergency Medicine

## 2022-09-16 ENCOUNTER — Emergency Department (HOSPITAL_COMMUNITY): Payer: Self-pay

## 2022-09-16 ENCOUNTER — Other Ambulatory Visit: Payer: Self-pay

## 2022-09-16 DIAGNOSIS — E876 Hypokalemia: Secondary | ICD-10-CM | POA: Insufficient documentation

## 2022-09-16 DIAGNOSIS — K852 Alcohol induced acute pancreatitis without necrosis or infection: Secondary | ICD-10-CM | POA: Insufficient documentation

## 2022-09-16 DIAGNOSIS — F1010 Alcohol abuse, uncomplicated: Secondary | ICD-10-CM | POA: Insufficient documentation

## 2022-09-16 HISTORY — DX: Acute pancreatitis without necrosis or infection, unspecified: K85.90

## 2022-09-16 LAB — URINALYSIS, ROUTINE W REFLEX MICROSCOPIC
Bacteria, UA: NONE SEEN
Bilirubin Urine: NEGATIVE
Glucose, UA: NEGATIVE mg/dL
Hgb urine dipstick: NEGATIVE
Ketones, ur: NEGATIVE mg/dL
Leukocytes,Ua: NEGATIVE
Nitrite: NEGATIVE
Protein, ur: NEGATIVE mg/dL
Specific Gravity, Urine: 1.009 (ref 1.005–1.030)
pH: 6 (ref 5.0–8.0)

## 2022-09-16 LAB — CBC WITH DIFFERENTIAL/PLATELET
Abs Immature Granulocytes: 0.01 10*3/uL (ref 0.00–0.07)
Basophils Absolute: 0 10*3/uL (ref 0.0–0.1)
Basophils Relative: 1 %
Eosinophils Absolute: 0.1 10*3/uL (ref 0.0–0.5)
Eosinophils Relative: 2 %
HCT: 38.2 % — ABNORMAL LOW (ref 39.0–52.0)
Hemoglobin: 13 g/dL (ref 13.0–17.0)
Immature Granulocytes: 0 %
Lymphocytes Relative: 38 %
Lymphs Abs: 1.4 10*3/uL (ref 0.7–4.0)
MCH: 33.9 pg (ref 26.0–34.0)
MCHC: 34 g/dL (ref 30.0–36.0)
MCV: 99.7 fL (ref 80.0–100.0)
Monocytes Absolute: 0.6 10*3/uL (ref 0.1–1.0)
Monocytes Relative: 16 %
Neutro Abs: 1.6 10*3/uL — ABNORMAL LOW (ref 1.7–7.7)
Neutrophils Relative %: 43 %
Platelets: 182 10*3/uL (ref 150–400)
RBC: 3.83 MIL/uL — ABNORMAL LOW (ref 4.22–5.81)
RDW: 13.9 % (ref 11.5–15.5)
WBC: 3.7 10*3/uL — ABNORMAL LOW (ref 4.0–10.5)
nRBC: 0 % (ref 0.0–0.2)

## 2022-09-16 LAB — COMPREHENSIVE METABOLIC PANEL
ALT: 69 U/L — ABNORMAL HIGH (ref 0–44)
AST: 115 U/L — ABNORMAL HIGH (ref 15–41)
Albumin: 3.9 g/dL (ref 3.5–5.0)
Alkaline Phosphatase: 58 U/L (ref 38–126)
Anion gap: 13 (ref 5–15)
BUN: <5 mg/dL — ABNORMAL LOW (ref 6–20)
CO2: 28 mmol/L (ref 22–32)
Calcium: 9.5 mg/dL (ref 8.9–10.3)
Chloride: 104 mmol/L (ref 98–111)
Creatinine, Ser: 0.73 mg/dL (ref 0.61–1.24)
GFR, Estimated: >60 mL/min (ref >60)
Glucose, Bld: 111 mg/dL — ABNORMAL HIGH (ref 70–99)
Potassium: 2.6 mmol/L — CL (ref 3.5–5.1)
Sodium: 145 mmol/L (ref 135–145)
Total Bilirubin: 0.4 mg/dL (ref 0.3–1.2)
Total Protein: 7.5 g/dL (ref 6.5–8.1)

## 2022-09-16 LAB — MAGNESIUM: Magnesium: 1.9 mg/dL (ref 1.7–2.4)

## 2022-09-16 LAB — ETHANOL: Alcohol, Ethyl (B): 308 mg/dL (ref <10)

## 2022-09-16 LAB — LIPASE, BLOOD: Lipase: 818 U/L — ABNORMAL HIGH (ref 11–51)

## 2022-09-16 MED ORDER — MAGNESIUM SULFATE 2 GM/50ML IV SOLN
2.0000 g | Freq: Once | INTRAVENOUS | Status: AC
  Administered 2022-09-16: 2 g via INTRAVENOUS
  Filled 2022-09-16: qty 50

## 2022-09-16 MED ORDER — SODIUM CHLORIDE 0.9 % IV BOLUS
1000.0000 mL | Freq: Once | INTRAVENOUS | Status: AC
  Administered 2022-09-16: 1000 mL via INTRAVENOUS

## 2022-09-16 MED ORDER — IOHEXOL 300 MG/ML  SOLN
100.0000 mL | Freq: Once | INTRAMUSCULAR | Status: AC | PRN
  Administered 2022-09-16: 100 mL via INTRAVENOUS

## 2022-09-16 MED ORDER — POTASSIUM CHLORIDE 10 MEQ/100ML IV SOLN
10.0000 meq | INTRAVENOUS | Status: AC
  Administered 2022-09-16 – 2022-09-17 (×4): 10 meq via INTRAVENOUS
  Filled 2022-09-16 (×4): qty 100

## 2022-09-16 MED ORDER — HYDROMORPHONE HCL 1 MG/ML IJ SOLN
1.0000 mg | Freq: Once | INTRAMUSCULAR | Status: AC
  Administered 2022-09-16: 1 mg via INTRAVENOUS
  Filled 2022-09-16: qty 1

## 2022-09-16 MED ORDER — METOCLOPRAMIDE HCL 5 MG/ML IJ SOLN
5.0000 mg | Freq: Once | INTRAMUSCULAR | Status: AC
  Administered 2022-09-16: 5 mg via INTRAVENOUS
  Filled 2022-09-16: qty 2

## 2022-09-16 MED ORDER — POTASSIUM CHLORIDE CRYS ER 20 MEQ PO TBCR
40.0000 meq | EXTENDED_RELEASE_TABLET | Freq: Once | ORAL | Status: AC
  Administered 2022-09-16: 40 meq via ORAL
  Filled 2022-09-16 (×2): qty 2

## 2022-09-16 NOTE — ED Provider Notes (Signed)
[facility] [place]-EMERGENCY DEPT Provider Note   CSN: [id number] Arrival date & time: 09/16/22  1829     History  Chief Complaint  Patient presents with   Alcohol Intoxication    Gary [name] is a 36 y.o. male.  36 year old male brought in by EMS.  Patient left the hospital AMA on 09/13/2022 where he was admitted for pancreatitis.  Patient states that he refused to celebrate his birthday in the hospital so he forced the hospitalist to discharge him.  He admits that he left and has been drinking alcohol heavily since that time.  He states that he was sitting on the curb with a homeless person sign when he passed out.  The next and he remembers is the fire truck rolling up.  He reports drinking only 3 bottles of wine today.  He states that he still has abdominal pain from his pancreatitis that he was admitted for.  He denies fevers or chills.  States that he has not been vomiting and did have a recent bowel movement which was firm/formed stools.  He is requesting to be involuntarily committed and admitted to the hospital to treat him, he would like to do this so that he cannot leave AMA again as he feels like he needs to be forced to stay to be treated and get better.       Home Medications Prior to Admission medications   Medication Sig Start Date End Date Taking? Authorizing Provider  bictegravir-emtricitabine-tenofovir AF (BIKTARVY) 50-200-25 MG TABS tablet Take 1 tablet by mouth daily. Patient not taking: Reported on 09/11/2022 05/28/22   Briant Cedar, MD  folic acid (FOLVITE) 1 MG tablet Take 1 tablet (1 mg total) by mouth daily. Patient not taking: Reported on 06/18/2022 05/01/22   Kathlen Mody, MD  gabapentin (NEURONTIN) 300 MG capsule Take 1 capsule (300 mg total) by mouth 3 (three) times daily. Patient not taking: Reported on 08/15/2022 05/01/22   Kathlen Mody, MD  levETIRAcetam (KEPPRA) 500 MG tablet Take 1 tablet (500 mg total) by mouth 2 (two) times  daily. Patient not taking: Reported on 09/11/2022 07/07/22   Gailen Shelter, PA  Multiple Vitamin (MULTIVITAMIN WITH MINERALS) TABS tablet Take 1 tablet by mouth daily. Patient not taking: Reported on 09/11/2022 05/02/22   Kathlen Mody, MD  pantoprazole (PROTONIX) 40 MG tablet Take 1 tablet (40 mg total) by mouth 2 (two) times daily. Patient not taking: Reported on 05/21/2022 05/01/22   Kathlen Mody, MD  thiamine 100 MG tablet Take 1 tablet (100 mg total) by mouth daily. Patient not taking: Reported on 08/15/2022 05/01/22   Kathlen Mody, MD  famotidine (PEPCID) 20 MG tablet Take 1 tablet (20 mg total) by mouth 2 (two) times daily. Patient not taking: Reported on 06/01/2019 05/14/19 06/02/19  Benjiman Core, MD      Allergies    Tegretol [carbamazepine] and Caffeine    Review of Systems   Review of Systems Negative except as per HPI Physical Exam Updated Vital Signs BP 114/85   Pulse 87   Temp 98.9 F (37.2 C) (Oral)   Resp (!) 22   SpO2 94%  Physical Exam Vitals and nursing note reviewed.  Constitutional:      General: He is not in acute distress.    Appearance: He is well-developed. He is not diaphoretic.  HENT:     Head: Normocephalic and atraumatic.  Cardiovascular:     Rate and Rhythm: Normal rate and regular rhythm.  Heart sounds: Normal heart sounds.  Pulmonary:     Effort: Pulmonary effort is normal.     Breath sounds: Normal breath sounds.  Abdominal:     Tenderness: There is abdominal tenderness.  Musculoskeletal:     Cervical back: Neck supple.     Right lower leg: No edema.     Left lower leg: No edema.  Skin:    General: Skin is warm and dry.     Findings: No bruising, erythema or rash.  Neurological:     Mental Status: He is alert and oriented to person, place, and time.  Psychiatric:        Behavior: Behavior is slowed and withdrawn.     ED Results / Procedures / Treatments   Labs (all labs ordered are listed, but only abnormal results are  displayed) Labs Reviewed  CBC WITH DIFFERENTIAL/PLATELET - Abnormal; Notable for the following components:      Result Value   WBC 3.7 (*)    RBC 3.83 (*)    HCT 38.2 (*)    Neutro Abs 1.6 (*)    All other components within normal limits  COMPREHENSIVE METABOLIC PANEL - Abnormal; Notable for the following components:   Potassium 2.6 (*)    Glucose, Bld 111 (*)    BUN <5 (*)    AST 115 (*)    ALT 69 (*)    All other components within normal limits  LIPASE, BLOOD - Abnormal; Notable for the following components:   Lipase 818 (*)    All other components within normal limits  URINALYSIS, ROUTINE W REFLEX MICROSCOPIC  ETHANOL  MAGNESIUM    EKG None  Radiology No results found.  Procedures .Critical Care  Performed by: Jeannie Fend, PA-C Authorized by: Jeannie Fend, PA-C   Critical care provider statement:    Critical care time (minutes):  30   Critical care was time spent personally by me on the following activities:  Development of treatment plan with patient or surrogate, discussions with consultants, evaluation of patient's response to treatment, examination of patient, ordering and review of laboratory studies, ordering and review of radiographic studies, ordering and performing treatments and interventions, pulse oximetry, re-evaluation of patient's condition and review of old charts     Medications Ordered in ED Medications  potassium chloride SA (KLOR-CON M) CR tablet 40 mEq (has no administration in time range)  sodium chloride 0.9 % bolus 1,000 mL (has no administration in time range)  potassium chloride 10 mEq in 100 mL IVPB (has no administration in time range)  magnesium sulfate IVPB 2 g 50 mL (has no administration in time range)  sodium chloride 0.9 % bolus 1,000 mL (0 mLs Intravenous Stopped 09/16/22 2113)    ED Course/ Medical Decision Making/ A&P                           Medical Decision Making Amount and/or Complexity of Data Reviewed Labs:  ordered.   This patient presents to the ED for concern of alcohol intoxication, report of abdominal pain, this involves an extensive number of treatment options, and is a complaint that carries with it a high risk of complications and morbidity.  The differential diagnosis includes but not limited to intoxicated, pancreatitis, metabolic disturbance, dehydration   Co morbidities that complicate the patient evaluation  HIV, polysubstance abuse, alcohol use disorder and dependence, major depressive disorder, thrombocytopenia   Additional history obtained:  Additional history obtained  from EMS who notes patient was found lying on the ground External records from outside source obtained and reviewed including discharge summary dated 09/13/2022 where patient was admitted to hospital from September 11, 2022 and left AMA on 09/13/2022.  He was provided with Librium while in the hospital however left prior to completing taper.   Lab Tests:  I Ordered, and personally interpreted labs.  The pertinent results include: Urinalysis is unremarkable.  Lipase is elevated at 818.  CMP with potassium of 2.6, AST slightly elevated compared to prior 115 with ALT of 69.  Total bili normal.  CBC with white count 3.7.  Cardiac Monitoring: / EKG:  The patient was maintained on a cardiac monitor.  I personally viewed and interpreted the cardiac monitored which showed an underlying rhythm of: EKG sinus rhythm, rate 90, flat T waves   Consultations Obtained:  I requested consultation with the Dr. Julian Reil with [facility],  and discussed lab and imaging findings as well as pertinent plan - they recommend: recommend CT abd/pelvis, concern for developing fluid collection on prior CT.    Problem List / ED Course / Critical interventions / Medication management  36 year old male presents with EMS after he was found unconscious on the sidewalk.  Patient admits that he was sitting on the sidewalk today with  his homeless sign, drank too much alcohol and passed out.  He does not have any evidence of traumatic injury.  When asked, he does admit to having diffuse abdominal pain, known history of pancreatitis, left the hospital AMA 3 days ago to celebrate his birthday.  He was previously admitted for pancreatitis.  He was provided with Librium taper while in the hospital, left without completing his taper.  He is found to have a significant hypokalemia with a potassium of 2.6 with flattening of his T waves on EKG.  He is provided with potassium, magnesium, plan to admit to hospitalist service.  In consultation with Dr. Julian Reil, requests repeat CT abdomen pelvis due to prior scan with concern for developing collection.  If scan does not show any complications of pancreatitis, patient can be discharged once potassium is repleted if tolerating p.o.'s.  Discussed concerns for alcohol withdrawal.  CIWA score ordered, blood pressure and heart rate normal at this time although will require constant monitoring while in the emergency room.  Patient is signed out at change of shift to oncoming provider pending CT scan and final disposition. I ordered medication including kdur, kcl  for hypokalemia  Reevaluation of the patient after these medicines showed that the patient stayed the same I have reviewed the patients home medicines and have made adjustments as needed   Social Determinants of Health:  Homeless, alcohol abuse   Test / Admission - Considered:  Admit for hypokalemia and ongoing pancreatitis         Final Clinical Impression(s) / ED Diagnoses Final diagnoses:  Alcohol-induced acute pancreatitis, unspecified complication status  ETOH abuse  Hypokalemia    Rx / DC Orders ED Discharge Orders     None         Jeannie Fend, PA-C 09/16/22 2246    Franne Forts, DO 09/21/22 [phone]

## 2022-09-16 NOTE — ED Triage Notes (Signed)
EMS was called because pt was lying in the median of [facility]. face down. Pt stated that his birthday was yesterday and so he had been drinking liquor, beer and wine. Pt was actively drinking wine with EMS arrived.

## 2022-09-16 NOTE — ED Notes (Signed)
Pt able to tolerate PO fluids without issue.

## 2022-09-16 NOTE — ED Notes (Signed)
Pt transport to CT  

## 2022-09-17 ENCOUNTER — Other Ambulatory Visit (HOSPITAL_COMMUNITY): Payer: Self-pay

## 2022-09-17 LAB — BASIC METABOLIC PANEL WITH GFR
Anion gap: 5 (ref 5–15)
BUN: <5 mg/dL — ABNORMAL LOW (ref 6–20)
CO2: 25 mmol/L (ref 22–32)
Calcium: 7.4 mg/dL — ABNORMAL LOW (ref 8.9–10.3)
Chloride: 111 mmol/L (ref 98–111)
Creatinine, Ser: 0.51 mg/dL — ABNORMAL LOW (ref 0.61–1.24)
GFR, Estimated: >60 mL/min
Glucose, Bld: 92 mg/dL (ref 70–99)
Potassium: 3.6 mmol/L (ref 3.5–5.1)
Sodium: 141 mmol/L (ref 135–145)

## 2022-09-17 MED ORDER — CHLORDIAZEPOXIDE HCL 25 MG PO CAPS
ORAL_CAPSULE | ORAL | 0 refills | Status: DC
  Filled 2022-09-17: qty 10, 3d supply, fill #0

## 2022-09-17 NOTE — Discharge Instructions (Addendum)
Please stop drinking alcohol as this is contributing to your symptoms.  Please return to the ER if your pain returns.  Take the Librium as directed until finished.

## 2022-09-17 NOTE — ED Notes (Signed)
Will draw BMP following K infusion completion per PA

## 2022-09-17 NOTE — ED Notes (Signed)
Pt resting, respirations equal & unlabored, will continue to monitor. Pt placed on 2 L N/C

## 2022-10-14 ENCOUNTER — Other Ambulatory Visit: Payer: Self-pay

## 2022-10-14 ENCOUNTER — Emergency Department (HOSPITAL_COMMUNITY)
Admission: EM | Admit: 2022-10-14 | Discharge: 2022-10-15 | Disposition: A | Discharge status: Home / Self Care | Payer: Medicaid Other | Attending: Emergency Medicine | Admitting: Emergency Medicine

## 2022-10-14 DIAGNOSIS — R110 Nausea: Secondary | ICD-10-CM | POA: Insufficient documentation

## 2022-10-14 DIAGNOSIS — Y907 Blood alcohol level of 200-239 mg/100 ml: Secondary | ICD-10-CM | POA: Insufficient documentation

## 2022-10-14 DIAGNOSIS — R6883 Chills (without fever): Secondary | ICD-10-CM | POA: Insufficient documentation

## 2022-10-14 DIAGNOSIS — F1090 Alcohol use, unspecified, uncomplicated: Secondary | ICD-10-CM | POA: Insufficient documentation

## 2022-10-14 DIAGNOSIS — Z5321 Procedure and treatment not carried out due to patient leaving prior to being seen by health care provider: Secondary | ICD-10-CM | POA: Insufficient documentation

## 2022-10-14 NOTE — ED Triage Notes (Signed)
Patient coming to ED for evaluation of ETOH problems.  Reports that he is "going through withdrawals."  Per EMS patient has been drinking recently.  Pt c/o "being cold" and nausea

## 2022-10-14 NOTE — ED Notes (Signed)
Patient refusing vital signs at this time.  Multiple attempts made to obtain vital signs without success.

## 2022-10-15 ENCOUNTER — Emergency Department (EMERGENCY_DEPARTMENT_HOSPITAL)
Admission: EM | Admit: 2022-10-15 | Discharge: 2022-10-16 | Disposition: A | Discharge status: Other Acute Inpatient Hospital | Payer: Medicaid Other | Source: Home / Self Care | Attending: Emergency Medicine | Admitting: Emergency Medicine

## 2022-10-15 DIAGNOSIS — F10929 Alcohol use, unspecified with intoxication, unspecified: Secondary | ICD-10-CM | POA: Diagnosis present

## 2022-10-15 DIAGNOSIS — Z1152 Encounter for screening for COVID-19: Secondary | ICD-10-CM | POA: Insufficient documentation

## 2022-10-15 DIAGNOSIS — F17210 Nicotine dependence, cigarettes, uncomplicated: Secondary | ICD-10-CM | POA: Insufficient documentation

## 2022-10-15 DIAGNOSIS — Y908 Blood alcohol level of 240 mg/100 ml or more: Secondary | ICD-10-CM | POA: Insufficient documentation

## 2022-10-15 DIAGNOSIS — F10229 Alcohol dependence with intoxication, unspecified: Secondary | ICD-10-CM | POA: Insufficient documentation

## 2022-10-15 DIAGNOSIS — F10920 Alcohol use, unspecified with intoxication, uncomplicated: Secondary | ICD-10-CM | POA: Diagnosis present

## 2022-10-15 DIAGNOSIS — F10921 Alcohol use, unspecified with intoxication delirium: Secondary | ICD-10-CM | POA: Diagnosis present

## 2022-10-15 DIAGNOSIS — Z79899 Other long term (current) drug therapy: Secondary | ICD-10-CM | POA: Insufficient documentation

## 2022-10-15 DIAGNOSIS — F10939 Alcohol use, unspecified with withdrawal, unspecified: Secondary | ICD-10-CM | POA: Diagnosis present

## 2022-10-15 DIAGNOSIS — Z21 Asymptomatic human immunodeficiency virus [HIV] infection status: Secondary | ICD-10-CM | POA: Insufficient documentation

## 2022-10-15 DIAGNOSIS — F1094 Alcohol use, unspecified with alcohol-induced mood disorder: Secondary | ICD-10-CM | POA: Diagnosis present

## 2022-10-15 LAB — CBC
HCT: 41.1 % (ref 39.0–52.0)
Hemoglobin: 13.7 g/dL (ref 13.0–17.0)
MCH: 34.1 pg — ABNORMAL HIGH (ref 26.0–34.0)
MCHC: 33.3 g/dL (ref 30.0–36.0)
MCV: 102.2 fL — ABNORMAL HIGH (ref 80.0–100.0)
Platelets: 112 10*3/uL — ABNORMAL LOW (ref 150–400)
RBC: 4.02 MIL/uL — ABNORMAL LOW (ref 4.22–5.81)
RDW: 14.1 % (ref 11.5–15.5)
WBC: 2.9 10*3/uL — ABNORMAL LOW (ref 4.0–10.5)
nRBC: 0 % (ref 0.0–0.2)

## 2022-10-15 LAB — CBC WITH DIFFERENTIAL/PLATELET
Abs Immature Granulocytes: 0.01 10*3/uL (ref 0.00–0.07)
Basophils Absolute: 0.1 10*3/uL (ref 0.0–0.1)
Basophils Relative: 2 %
Eosinophils Absolute: 0.1 10*3/uL (ref 0.0–0.5)
Eosinophils Relative: 2 %
HCT: 38.3 % — ABNORMAL LOW (ref 39.0–52.0)
Hemoglobin: 12.9 g/dL — ABNORMAL LOW (ref 13.0–17.0)
Immature Granulocytes: 0 %
Lymphocytes Relative: 53 %
Lymphs Abs: 1.7 10*3/uL (ref 0.7–4.0)
MCH: 34.1 pg — ABNORMAL HIGH (ref 26.0–34.0)
MCHC: 33.7 g/dL (ref 30.0–36.0)
MCV: 101.3 fL — ABNORMAL HIGH (ref 80.0–100.0)
Monocytes Absolute: 0.2 10*3/uL (ref 0.1–1.0)
Monocytes Relative: 8 %
Neutro Abs: 1.1 10*3/uL — ABNORMAL LOW (ref 1.7–7.7)
Neutrophils Relative %: 35 %
Platelets: 123 10*3/uL — ABNORMAL LOW (ref 150–400)
RBC: 3.78 MIL/uL — ABNORMAL LOW (ref 4.22–5.81)
RDW: 14.1 % (ref 11.5–15.5)
WBC: 3.2 10*3/uL — ABNORMAL LOW (ref 4.0–10.5)
nRBC: 0 % (ref 0.0–0.2)

## 2022-10-15 LAB — COMPREHENSIVE METABOLIC PANEL
ALT: 85 U/L — ABNORMAL HIGH (ref 0–44)
ALT: 77 U/L — ABNORMAL HIGH (ref 0–44)
AST: 165 U/L — ABNORMAL HIGH (ref 15–41)
AST: 130 U/L — ABNORMAL HIGH (ref 15–41)
Albumin: 3.8 g/dL (ref 3.5–5.0)
Albumin: 3.9 g/dL (ref 3.5–5.0)
Alkaline Phosphatase: 75 U/L (ref 38–126)
Alkaline Phosphatase: 76 U/L (ref 38–126)
Anion gap: 9 (ref 5–15)
Anion gap: 10 (ref 5–15)
BUN: 7 mg/dL (ref 6–20)
BUN: 5 mg/dL — ABNORMAL LOW (ref 6–20)
CO2: 23 mmol/L (ref 22–32)
CO2: 22 mmol/L (ref 22–32)
Calcium: 8.3 mg/dL — ABNORMAL LOW (ref 8.9–10.3)
Calcium: 8.4 mg/dL — ABNORMAL LOW (ref 8.9–10.3)
Chloride: 109 mmol/L (ref 98–111)
Chloride: 108 mmol/L (ref 98–111)
Creatinine, Ser: 0.57 mg/dL — ABNORMAL LOW (ref 0.61–1.24)
Creatinine, Ser: 0.73 mg/dL (ref 0.61–1.24)
GFR, Estimated: >60 mL/min (ref >60)
GFR, Estimated: >60 mL/min (ref >60)
Glucose, Bld: 87 mg/dL (ref 70–99)
Glucose, Bld: 142 mg/dL — ABNORMAL HIGH (ref 70–99)
Potassium: 3.7 mmol/L (ref 3.5–5.1)
Potassium: 3.3 mmol/L — ABNORMAL LOW (ref 3.5–5.1)
Sodium: 141 mmol/L (ref 135–145)
Sodium: 140 mmol/L (ref 135–145)
Total Bilirubin: 0.6 mg/dL (ref 0.3–1.2)
Total Bilirubin: 0.5 mg/dL (ref 0.3–1.2)
Total Protein: 7.5 g/dL (ref 6.5–8.1)
Total Protein: 7.6 g/dL (ref 6.5–8.1)

## 2022-10-15 LAB — RAPID URINE DRUG SCREEN, HOSP PERFORMED
Amphetamines: NOT DETECTED
Barbiturates: NOT DETECTED
Benzodiazepines: NOT DETECTED
Cocaine: NOT DETECTED
Opiates: NOT DETECTED
Tetrahydrocannabinol: NOT DETECTED

## 2022-10-15 LAB — ETHANOL
Alcohol, Ethyl (B): 222 mg/dL — ABNORMAL HIGH (ref <10)
Alcohol, Ethyl (B): 448 mg/dL (ref <10)

## 2022-10-15 LAB — LIPASE, BLOOD: Lipase: 205 U/L — ABNORMAL HIGH (ref 11–51)

## 2022-10-15 MED ORDER — THIAMINE HCL 100 MG/ML IJ SOLN
100.0000 mg | Freq: Every day | INTRAMUSCULAR | Status: DC
  Administered 2022-10-15: 100 mg via INTRAVENOUS
  Filled 2022-10-15 (×2): qty 2

## 2022-10-15 MED ORDER — LORAZEPAM 1 MG PO TABS
1.0000 mg | ORAL_TABLET | ORAL | Status: DC | PRN
  Filled 2022-10-15: qty 3

## 2022-10-15 MED ORDER — LORAZEPAM 2 MG/ML IJ SOLN
0.0000 mg | INTRAMUSCULAR | Status: DC
  Administered 2022-10-16: 2 mg via INTRAVENOUS
  Administered 2022-10-16: 3 mg via INTRAVENOUS
  Filled 2022-10-15: qty 1
  Filled 2022-10-15: qty 2

## 2022-10-15 MED ORDER — FOLIC ACID 1 MG PO TABS
1.0000 mg | ORAL_TABLET | Freq: Every day | ORAL | Status: DC
  Administered 2022-10-16: 1 mg via ORAL
  Filled 2022-10-15 (×2): qty 1

## 2022-10-15 MED ORDER — ADULT MULTIVITAMIN W/MINERALS CH
1.0000 | ORAL_TABLET | Freq: Every day | ORAL | Status: DC
  Administered 2022-10-16: 1 via ORAL
  Filled 2022-10-15 (×2): qty 1

## 2022-10-15 MED ORDER — PANTOPRAZOLE SODIUM 40 MG PO TBEC
40.0000 mg | DELAYED_RELEASE_TABLET | Freq: Two times a day (BID) | ORAL | Status: DC
  Administered 2022-10-16: 40 mg via ORAL
  Filled 2022-10-15: qty 1

## 2022-10-15 MED ORDER — THIAMINE MONONITRATE 100 MG PO TABS
100.0000 mg | ORAL_TABLET | Freq: Every day | ORAL | Status: DC
  Administered 2022-10-16: 100 mg via ORAL
  Filled 2022-10-15 (×2): qty 1

## 2022-10-15 MED ORDER — LEVETIRACETAM 500 MG PO TABS
500.0000 mg | ORAL_TABLET | Freq: Two times a day (BID) | ORAL | Status: DC
  Administered 2022-10-16: 500 mg via ORAL
  Filled 2022-10-15: qty 1

## 2022-10-15 MED ORDER — LORAZEPAM 2 MG/ML IJ SOLN: 0.0000 mg | Freq: Three times a day (TID) | INTRAMUSCULAR | Status: DC

## 2022-10-15 MED ORDER — SODIUM CHLORIDE 0.9 % IV BOLUS
1000.0000 mL | Freq: Once | INTRAVENOUS | Status: AC
  Administered 2022-10-15: 1000 mL via INTRAVENOUS

## 2022-10-15 MED ORDER — LORAZEPAM 2 MG/ML IJ SOLN: 1.0000 mg | INTRAMUSCULAR | Status: DC | PRN

## 2022-10-15 MED ORDER — FOLIC ACID 1 MG PO TABS: 1.0000 mg | ORAL_TABLET | Freq: Every day | ORAL | Status: DC

## 2022-10-15 MED ORDER — LEVETIRACETAM IN NACL 1000 MG/100ML IV SOLN
1000.0000 mg | Freq: Once | INTRAVENOUS | Status: AC
  Administered 2022-10-15: 1000 mg via INTRAVENOUS
  Filled 2022-10-15: qty 100

## 2022-10-15 NOTE — ED Notes (Addendum)
Pt refused vitals at this time. Tech tried multiple times

## 2022-10-15 NOTE — ED Triage Notes (Signed)
Pt was in ED and left. Pt reports "I had to go buy some liquior because I am going through withdrawals"

## 2022-10-15 NOTE — BH Assessment (Addendum)
Patient's BAL is 448 at 20:28.  TTS to see patient when it is under 200.  TTS to check again in 8-10 hours.

## 2022-10-15 NOTE — ED Notes (Signed)
3 bags of patient belongings were placed at the 5-8 nurses station.

## 2022-10-15 NOTE — ED Notes (Signed)
Pt admittedly left premises to obtain ETOH. Bottle taken from pt and given to security.

## 2022-10-15 NOTE — ED Provider Notes (Signed)
[facility]-EMERGENCY DEPT Provider Note   CSN: [id number] Arrival date & time: 10/15/22  1142     History  Chief Complaint  Patient presents with   Alcohol Intoxication    Gary [name] is a 36 y.o. male history of HIV, depression, alcohol abuse, seizures here presenting with alcohol intoxication and depression.  Patient is homeless and has been drinking alcohol chronically.  Patient was admitted about a month ago for alcohol pancreatitis.  Patient continues to drink about 1.5 gallons of vodka a day.  Patient states that he wants to just drink himself to death and request that I have IVC him.  Patient has been very depressed and has no social support.  He states that he often likes a fire because the weather is cold and he has singed his hairs previously.  Patient states that he gets into alcohol withdrawal very easily and came to the ER earlier today and went out and drank some more alcohol.  Patient states that he is seeking help right now.  The history is provided by the patient.       Home Medications Prior to Admission medications   Medication Sig Start Date End Date Taking? Authorizing Provider  bictegravir-emtricitabine-tenofovir AF (BIKTARVY) 50-200-25 MG TABS tablet Take 1 tablet by mouth daily. Patient not taking: Reported on 09/11/2022 05/28/22   Briant Cedar, MD  chlordiazePOXIDE (LIBRIUM) 25 MG capsule Take 50mg  by mouth three times daily for 1 day, then 25-50mg  by mouth twice daily for 1 day, then 25-50mg  by mouth once daily for 1 day 09/17/22   13/9/23, PA-C  folic acid (FOLVITE) 1 MG tablet Take 1 tablet (1 mg total) by mouth daily. Patient not taking: Reported on 06/18/2022 05/01/22   05/03/22, MD  gabapentin (NEURONTIN) 300 MG capsule Take 1 capsule (300 mg total) by mouth 3 (three) times daily. Patient not taking: Reported on 08/15/2022 05/01/22   05/03/22, MD  levETIRAcetam (KEPPRA) 500 MG tablet Take 1 tablet (500 mg  total) by mouth 2 (two) times daily. Patient not taking: Reported on 09/11/2022 07/07/22   07/09/22, PA  Multiple Vitamin (MULTIVITAMIN WITH MINERALS) TABS tablet Take 1 tablet by mouth daily. Patient not taking: Reported on 09/11/2022 05/02/22   05/04/22, MD  pantoprazole (PROTONIX) 40 MG tablet Take 1 tablet (40 mg total) by mouth 2 (two) times daily. Patient not taking: Reported on 05/21/2022 05/01/22   05/03/22, MD  thiamine 100 MG tablet Take 1 tablet (100 mg total) by mouth daily. Patient not taking: Reported on 08/15/2022 05/01/22   05/03/22, MD  famotidine (PEPCID) 20 MG tablet Take 1 tablet (20 mg total) by mouth 2 (two) times daily. Patient not taking: Reported on 06/01/2019 05/14/19 06/02/19  06/04/19, MD      Allergies    Tegretol [carbamazepine] and Caffeine    Review of Systems   Review of Systems  Psychiatric/Behavioral:  Positive for confusion and suicidal ideas.   All other systems reviewed and are negative.   Physical Exam Updated Vital Signs BP 109/79   Pulse 90   Temp 98.2 F (36.8 C)   Resp 18   SpO2 97%  Physical Exam Vitals and nursing note reviewed.  Constitutional:      Comments: Intoxicated and depressed  HENT:     Head: Normocephalic.     Nose: Nose normal.     Mouth/Throat:     Mouth: Mucous membranes are dry.  Eyes:  Extraocular Movements: Extraocular movements intact.     Pupils: Pupils are equal, round, and reactive to light.  Cardiovascular:     Rate and Rhythm: Normal rate and regular rhythm.     Pulses: Normal pulses.     Heart sounds: Normal heart sounds.  Pulmonary:     Effort: Pulmonary effort is normal.     Breath sounds: Normal breath sounds.  Abdominal:     General: Abdomen is flat.     Palpations: Abdomen is soft.  Musculoskeletal:        General: Normal range of motion.     Cervical back: Normal range of motion and neck supple.  Skin:    General: Skin is warm.     Capillary Refill: Capillary  refill takes less than 2 seconds.  Neurological:     Comments: Intoxicated      ED Results / Procedures / Treatments   Labs (all labs ordered are listed, but only abnormal results are displayed) Labs Reviewed  CBC WITH DIFFERENTIAL/PLATELET  COMPREHENSIVE METABOLIC PANEL  ETHANOL  LIPASE, BLOOD  RAPID URINE DRUG SCREEN, HOSP PERFORMED    EKG None  Radiology No results found.  Procedures Procedures    Medications Ordered in ED Medications  sodium chloride 0.9 % bolus 1,000 mL (has no administration in time range)  levETIRAcetam (KEPPRA) IVPB 1000 mg/100 mL premix (has no administration in time range)    ED Course/ Medical Decision Making/ A&P                           Medical Decision Making JERELL VIVERITO is a 36 y.o. male here with alcohol intoxication and depression.  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  Patient states that whenever he starts withdrawing, he would try and leave so requested to IVC him.  Patient is thinking of drinking himself to death.  Will IVC patient and started patient on CIWA and consult TTS.  10:49 PM Patient's alcohol level is 448.  His lipase is 200 but he was tolerating p.o. and has no epigastric pain currently.  I think this is lower than his previous lipase level.  Patient is started on CIWA and ordered Ativan per CIWA protocol.  TTS consult ordered.  Patient is medically clear for psych eval.  Problems Addressed: Alcoholic intoxication with complication [facility]): acute illness or injury  Amount and/or Complexity of Data Reviewed Labs: ordered. Decision-making details documented in ED Course.  Risk OTC drugs. Prescription drug management.   Final Clinical Impression(s) / ED Diagnoses Final diagnoses:  None    Rx / DC Orders ED Discharge Orders     None         Drenda Freeze, MD 10/15/22 2252

## 2022-10-16 ENCOUNTER — Inpatient Hospital Stay (HOSPITAL_COMMUNITY)
Admission: EM | Admit: 2022-10-16 | Discharge: 2022-10-23 | DRG: 897 | Disposition: A | Discharge status: Home / Self Care | Payer: Medicaid Other | Attending: Internal Medicine | Admitting: Internal Medicine

## 2022-10-16 ENCOUNTER — Inpatient Hospital Stay (HOSPITAL_COMMUNITY)
Admission: AD | Admit: 2022-10-16 | Discharge: 2022-10-16 | DRG: 897 | Disposition: A | Discharge status: Other Acute Inpatient Hospital | Payer: Medicaid Other | Source: Intra-hospital | Attending: Emergency Medicine | Admitting: Emergency Medicine

## 2022-10-16 ENCOUNTER — Encounter (HOSPITAL_COMMUNITY): Payer: Self-pay

## 2022-10-16 ENCOUNTER — Other Ambulatory Visit: Payer: Self-pay

## 2022-10-16 DIAGNOSIS — Z79899 Other long term (current) drug therapy: Secondary | ICD-10-CM | POA: Diagnosis not present

## 2022-10-16 DIAGNOSIS — R45851 Suicidal ideations: Secondary | ICD-10-CM | POA: Diagnosis present

## 2022-10-16 DIAGNOSIS — Y908 Blood alcohol level of 240 mg/100 ml or more: Secondary | ICD-10-CM | POA: Diagnosis present

## 2022-10-16 DIAGNOSIS — F10239 Alcohol dependence with withdrawal, unspecified: Secondary | ICD-10-CM | POA: Diagnosis present

## 2022-10-16 DIAGNOSIS — B20 Human immunodeficiency virus [HIV] disease: Secondary | ICD-10-CM | POA: Diagnosis present

## 2022-10-16 DIAGNOSIS — Z811 Family history of alcohol abuse and dependence: Secondary | ICD-10-CM

## 2022-10-16 DIAGNOSIS — F3181 Bipolar II disorder: Secondary | ICD-10-CM | POA: Diagnosis present

## 2022-10-16 DIAGNOSIS — F1010 Alcohol abuse, uncomplicated: Secondary | ICD-10-CM | POA: Diagnosis not present

## 2022-10-16 DIAGNOSIS — Z888 Allergy status to other drugs, medicaments and biological substances status: Secondary | ICD-10-CM

## 2022-10-16 DIAGNOSIS — F4310 Post-traumatic stress disorder, unspecified: Secondary | ICD-10-CM | POA: Diagnosis present

## 2022-10-16 DIAGNOSIS — E876 Hypokalemia: Secondary | ICD-10-CM | POA: Diagnosis present

## 2022-10-16 DIAGNOSIS — F10232 Alcohol dependence with withdrawal with perceptual disturbance: Secondary | ICD-10-CM | POA: Diagnosis not present

## 2022-10-16 DIAGNOSIS — F419 Anxiety disorder, unspecified: Secondary | ICD-10-CM | POA: Diagnosis present

## 2022-10-16 DIAGNOSIS — F1020 Alcohol dependence, uncomplicated: Secondary | ICD-10-CM | POA: Diagnosis not present

## 2022-10-16 DIAGNOSIS — Z1152 Encounter for screening for COVID-19: Secondary | ICD-10-CM

## 2022-10-16 DIAGNOSIS — K709 Alcoholic liver disease, unspecified: Secondary | ICD-10-CM | POA: Diagnosis present

## 2022-10-16 DIAGNOSIS — Y909 Presence of alcohol in blood, level not specified: Secondary | ICD-10-CM | POA: Diagnosis present

## 2022-10-16 DIAGNOSIS — F10229 Alcohol dependence with intoxication, unspecified: Secondary | ICD-10-CM

## 2022-10-16 DIAGNOSIS — R451 Restlessness and agitation: Secondary | ICD-10-CM | POA: Diagnosis present

## 2022-10-16 DIAGNOSIS — F1014 Alcohol abuse with alcohol-induced mood disorder: Secondary | ICD-10-CM | POA: Diagnosis present

## 2022-10-16 DIAGNOSIS — F1914 Other psychoactive substance abuse with psychoactive substance-induced mood disorder: Secondary | ICD-10-CM | POA: Diagnosis present

## 2022-10-16 DIAGNOSIS — D696 Thrombocytopenia, unspecified: Secondary | ICD-10-CM | POA: Diagnosis present

## 2022-10-16 DIAGNOSIS — F10931 Alcohol use, unspecified with withdrawal delirium: Secondary | ICD-10-CM

## 2022-10-16 DIAGNOSIS — Z91148 Patient's other noncompliance with medication regimen for other reason: Secondary | ICD-10-CM

## 2022-10-16 DIAGNOSIS — F17210 Nicotine dependence, cigarettes, uncomplicated: Secondary | ICD-10-CM | POA: Diagnosis present

## 2022-10-16 DIAGNOSIS — Z800 Family history of malignant neoplasm of digestive organs: Secondary | ICD-10-CM | POA: Diagnosis not present

## 2022-10-16 DIAGNOSIS — F1994 Other psychoactive substance use, unspecified with psychoactive substance-induced mood disorder: Secondary | ICD-10-CM | POA: Diagnosis present

## 2022-10-16 DIAGNOSIS — Z781 Physical restraint status: Secondary | ICD-10-CM

## 2022-10-16 DIAGNOSIS — Z5982 Transportation insecurity: Secondary | ICD-10-CM

## 2022-10-16 DIAGNOSIS — Z5900 Homelessness unspecified: Secondary | ICD-10-CM

## 2022-10-16 DIAGNOSIS — F10231 Alcohol dependence with withdrawal delirium: Principal | ICD-10-CM | POA: Diagnosis present

## 2022-10-16 DIAGNOSIS — D6959 Other secondary thrombocytopenia: Secondary | ICD-10-CM | POA: Diagnosis present

## 2022-10-16 DIAGNOSIS — F10939 Alcohol use, unspecified with withdrawal, unspecified: Principal | ICD-10-CM

## 2022-10-16 DIAGNOSIS — R440 Auditory hallucinations: Secondary | ICD-10-CM | POA: Diagnosis present

## 2022-10-16 DIAGNOSIS — K5900 Constipation, unspecified: Secondary | ICD-10-CM | POA: Diagnosis present

## 2022-10-16 DIAGNOSIS — Z21 Asymptomatic human immunodeficiency virus [HIV] infection status: Secondary | ICD-10-CM | POA: Diagnosis present

## 2022-10-16 DIAGNOSIS — F209 Schizophrenia, unspecified: Secondary | ICD-10-CM | POA: Diagnosis present

## 2022-10-16 DIAGNOSIS — Z87898 Personal history of other specified conditions: Secondary | ICD-10-CM

## 2022-10-16 DIAGNOSIS — R441 Visual hallucinations: Secondary | ICD-10-CM | POA: Diagnosis present

## 2022-10-16 DIAGNOSIS — Z5948 Other specified lack of adequate food: Secondary | ICD-10-CM

## 2022-10-16 DIAGNOSIS — E871 Hypo-osmolality and hyponatremia: Secondary | ICD-10-CM | POA: Diagnosis present

## 2022-10-16 DIAGNOSIS — F10930 Alcohol use, unspecified with withdrawal, uncomplicated: Secondary | ICD-10-CM | POA: Diagnosis not present

## 2022-10-16 DIAGNOSIS — Z9151 Personal history of suicidal behavior: Secondary | ICD-10-CM

## 2022-10-16 DIAGNOSIS — D7589 Other specified diseases of blood and blood-forming organs: Secondary | ICD-10-CM | POA: Diagnosis present

## 2022-10-16 DIAGNOSIS — R7989 Other specified abnormal findings of blood chemistry: Secondary | ICD-10-CM | POA: Diagnosis present

## 2022-10-16 LAB — CBC WITH DIFFERENTIAL/PLATELET
Abs Immature Granulocytes: 0.01 10*3/uL (ref 0.00–0.07)
Basophils Absolute: 0 10*3/uL (ref 0.0–0.1)
Basophils Relative: 1 %
Eosinophils Absolute: 0 10*3/uL (ref 0.0–0.5)
Eosinophils Relative: 1 %
HCT: 37.1 % — ABNORMAL LOW (ref 39.0–52.0)
Hemoglobin: 12.5 g/dL — ABNORMAL LOW (ref 13.0–17.0)
Immature Granulocytes: 0 %
Lymphocytes Relative: 27 %
Lymphs Abs: 0.9 10*3/uL (ref 0.7–4.0)
MCH: 34 pg (ref 26.0–34.0)
MCHC: 33.7 g/dL (ref 30.0–36.0)
MCV: 100.8 fL — ABNORMAL HIGH (ref 80.0–100.0)
Monocytes Absolute: 0.3 10*3/uL (ref 0.1–1.0)
Monocytes Relative: 8 %
Neutro Abs: 2.1 10*3/uL (ref 1.7–7.7)
Neutrophils Relative %: 63 %
Platelets: 108 10*3/uL — ABNORMAL LOW (ref 150–400)
RBC: 3.68 MIL/uL — ABNORMAL LOW (ref 4.22–5.81)
RDW: 13.6 % (ref 11.5–15.5)
WBC: 3.3 10*3/uL — ABNORMAL LOW (ref 4.0–10.5)
nRBC: 0 % (ref 0.0–0.2)

## 2022-10-16 LAB — COMPREHENSIVE METABOLIC PANEL
ALT: 75 U/L — ABNORMAL HIGH (ref 0–44)
AST: 140 U/L — ABNORMAL HIGH (ref 15–41)
Albumin: 3.3 g/dL — ABNORMAL LOW (ref 3.5–5.0)
Alkaline Phosphatase: 70 U/L (ref 38–126)
Anion gap: 8 (ref 5–15)
BUN: 6 mg/dL (ref 6–20)
CO2: 25 mmol/L (ref 22–32)
Calcium: 10 mg/dL (ref 8.9–10.3)
Chloride: 103 mmol/L (ref 98–111)
Creatinine, Ser: 0.77 mg/dL (ref 0.61–1.24)
GFR, Estimated: >60 mL/min (ref >60)
Glucose, Bld: 114 mg/dL — ABNORMAL HIGH (ref 70–99)
Potassium: 4.5 mmol/L (ref 3.5–5.1)
Sodium: 136 mmol/L (ref 135–145)
Total Bilirubin: 0.8 mg/dL (ref 0.3–1.2)
Total Protein: 6.8 g/dL (ref 6.5–8.1)

## 2022-10-16 LAB — SARS CORONAVIRUS 2 BY RT PCR: SARS Coronavirus 2 by RT PCR: NEGATIVE

## 2022-10-16 LAB — LIPASE, BLOOD: Lipase: 220 U/L — ABNORMAL HIGH (ref 11–51)

## 2022-10-16 MED ORDER — LEVETIRACETAM 500 MG PO TABS
500.0000 mg | ORAL_TABLET | Freq: Two times a day (BID) | ORAL | Status: DC
  Administered 2022-10-17 (×2): 500 mg via ORAL
  Filled 2022-10-16 (×3): qty 1

## 2022-10-16 MED ORDER — CHLORDIAZEPOXIDE HCL 25 MG PO CAPS
25.0000 mg | ORAL_CAPSULE | Freq: Three times a day (TID) | ORAL | Status: AC
  Administered 2022-10-18 – 2022-10-19 (×2): 25 mg via ORAL
  Filled 2022-10-16 (×2): qty 1

## 2022-10-16 MED ORDER — THIAMINE MONONITRATE 100 MG PO TABS
100.0000 mg | ORAL_TABLET | Freq: Every day | ORAL | Status: DC
  Filled 2022-10-16: qty 1

## 2022-10-16 MED ORDER — LORAZEPAM 2 MG/ML IJ SOLN
1.0000 mg | INTRAMUSCULAR | Status: AC | PRN
  Administered 2022-10-17: 2 mg via INTRAVENOUS
  Administered 2022-10-17: 1 mg via INTRAVENOUS
  Administered 2022-10-17 (×4): 4 mg via INTRAVENOUS
  Administered 2022-10-17: 3 mg via INTRAVENOUS
  Administered 2022-10-17: 2 mg via INTRAVENOUS
  Administered 2022-10-17: 4 mg via INTRAVENOUS
  Administered 2022-10-19 (×2): 2 mg via INTRAVENOUS
  Filled 2022-10-16: qty 2
  Filled 2022-10-16: qty 1
  Filled 2022-10-16 (×3): qty 2
  Filled 2022-10-16 (×4): qty 1
  Filled 2022-10-16 (×2): qty 2

## 2022-10-16 MED ORDER — ADULT MULTIVITAMIN W/MINERALS CH
1.0000 | ORAL_TABLET | Freq: Every day | ORAL | Status: DC
  Administered 2022-10-17 – 2022-10-23 (×6): 1 via ORAL
  Filled 2022-10-16 (×7): qty 1

## 2022-10-16 MED ORDER — ADULT MULTIVITAMIN W/MINERALS CH
1.0000 | ORAL_TABLET | Freq: Every day | ORAL | Status: DC
  Administered 2022-10-16: 1 via ORAL
  Filled 2022-10-16 (×3): qty 1

## 2022-10-16 MED ORDER — SODIUM CHLORIDE 0.9 % IV BOLUS
1000.0000 mL | Freq: Once | INTRAVENOUS | Status: AC
  Administered 2022-10-16: 1000 mL via INTRAVENOUS

## 2022-10-16 MED ORDER — LEVETIRACETAM 500 MG PO TABS
500.0000 mg | ORAL_TABLET | Freq: Two times a day (BID) | ORAL | Status: DC
  Administered 2022-10-16: 500 mg via ORAL
  Filled 2022-10-16 (×3): qty 1

## 2022-10-16 MED ORDER — MAGNESIUM HYDROXIDE 400 MG/5ML PO SUSP: 30.0000 mL | Freq: Every day | ORAL | Status: DC | PRN

## 2022-10-16 MED ORDER — LORAZEPAM 1 MG PO TABS
1.0000 mg | ORAL_TABLET | Freq: Four times a day (QID) | ORAL | Status: DC
  Administered 2022-10-16: 1 mg via ORAL
  Filled 2022-10-16: qty 1

## 2022-10-16 MED ORDER — LORAZEPAM 2 MG/ML IJ SOLN
INTRAMUSCULAR | Status: AC
  Administered 2022-10-16: 4 mg
  Filled 2022-10-16: qty 2

## 2022-10-16 MED ORDER — CHLORDIAZEPOXIDE HCL 25 MG PO CAPS: 25.0000 mg | ORAL_CAPSULE | Freq: Four times a day (QID) | ORAL | Status: AC | PRN

## 2022-10-16 MED ORDER — LORAZEPAM 1 MG PO TABS
1.0000 mg | ORAL_TABLET | ORAL | Status: AC | PRN
  Administered 2022-10-17: 2 mg via ORAL
  Administered 2022-10-17: 4 mg via ORAL
  Administered 2022-10-17: 1 mg via ORAL
  Administered 2022-10-17: 2 mg via ORAL
  Filled 2022-10-16: qty 1
  Filled 2022-10-16 (×2): qty 2
  Filled 2022-10-16: qty 4

## 2022-10-16 MED ORDER — LACTATED RINGERS IV SOLN: INTRAVENOUS | Status: AC

## 2022-10-16 MED ORDER — LOPERAMIDE HCL 2 MG PO CAPS: 2.0000 mg | ORAL_CAPSULE | ORAL | Status: AC | PRN

## 2022-10-16 MED ORDER — LORAZEPAM 1 MG PO TABS: 1.0000 mg | ORAL_TABLET | Freq: Every day | ORAL | Status: DC

## 2022-10-16 MED ORDER — ENOXAPARIN SODIUM 40 MG/0.4ML IJ SOSY
40.0000 mg | PREFILLED_SYRINGE | Freq: Every day | INTRAMUSCULAR | Status: DC
  Administered 2022-10-16 – 2022-10-20 (×5): 40 mg via SUBCUTANEOUS
  Filled 2022-10-16 (×7): qty 0.4

## 2022-10-16 MED ORDER — ACETAMINOPHEN 325 MG PO TABS: 650.0000 mg | ORAL_TABLET | Freq: Four times a day (QID) | ORAL | Status: DC | PRN

## 2022-10-16 MED ORDER — ONDANSETRON HCL 4 MG/2ML IJ SOLN
4.0000 mg | Freq: Four times a day (QID) | INTRAMUSCULAR | Status: DC | PRN
  Administered 2022-10-17 – 2022-10-19 (×3): 4 mg via INTRAVENOUS
  Filled 2022-10-16 (×3): qty 2

## 2022-10-16 MED ORDER — SODIUM CHLORIDE 0.9% FLUSH
3.0000 mL | Freq: Two times a day (BID) | INTRAVENOUS | Status: DC
  Administered 2022-10-16 – 2022-10-23 (×14): 3 mL via INTRAVENOUS

## 2022-10-16 MED ORDER — ACETAMINOPHEN 650 MG RE SUPP: 650.0000 mg | Freq: Four times a day (QID) | RECTAL | Status: DC | PRN

## 2022-10-16 MED ORDER — LORAZEPAM 1 MG PO TABS: 1.0000 mg | ORAL_TABLET | Freq: Three times a day (TID) | ORAL | Status: DC

## 2022-10-16 MED ORDER — TRAZODONE HCL 100 MG PO TABS: 50.0000 mg | ORAL_TABLET | Freq: Every evening | ORAL | Status: DC | PRN

## 2022-10-16 MED ORDER — THIAMINE MONONITRATE 100 MG PO TABS
100.0000 mg | ORAL_TABLET | Freq: Every day | ORAL | Status: DC
  Administered 2022-10-17 – 2022-10-23 (×6): 100 mg via ORAL
  Filled 2022-10-16 (×6): qty 1

## 2022-10-16 MED ORDER — LORAZEPAM 1 MG PO TABS: 1.0000 mg | ORAL_TABLET | Freq: Two times a day (BID) | ORAL | Status: DC

## 2022-10-16 MED ORDER — CHLORDIAZEPOXIDE HCL 25 MG PO CAPS
25.0000 mg | ORAL_CAPSULE | ORAL | Status: AC
  Administered 2022-10-19 – 2022-10-20 (×2): 25 mg via ORAL
  Filled 2022-10-16 (×2): qty 1

## 2022-10-16 MED ORDER — ALUM & MAG HYDROXIDE-SIMETH 200-200-20 MG/5ML PO SUSP: 30.0000 mL | ORAL | Status: DC | PRN

## 2022-10-16 MED ORDER — ONDANSETRON 4 MG PO TBDP: 4.0000 mg | ORAL_TABLET | Freq: Four times a day (QID) | ORAL | Status: DC | PRN

## 2022-10-16 MED ORDER — THIAMINE HCL 100 MG/ML IJ SOLN
100.0000 mg | Freq: Every day | INTRAMUSCULAR | Status: DC
  Administered 2022-10-18: 100 mg via INTRAVENOUS
  Filled 2022-10-16: qty 2

## 2022-10-16 MED ORDER — CHLORDIAZEPOXIDE HCL 25 MG PO CAPS
25.0000 mg | ORAL_CAPSULE | Freq: Every day | ORAL | Status: AC
  Administered 2022-10-20: 25 mg via ORAL
  Filled 2022-10-16: qty 1

## 2022-10-16 MED ORDER — BICTEGRAVIR-EMTRICITAB-TENOFOV 50-200-25 MG PO TABS
1.0000 | ORAL_TABLET | Freq: Every day | ORAL | Status: DC
  Administered 2022-10-17 – 2022-10-22 (×7): 1 via ORAL
  Filled 2022-10-16 (×8): qty 1

## 2022-10-16 MED ORDER — LORAZEPAM 1 MG PO TABS: 1.0000 mg | ORAL_TABLET | Freq: Four times a day (QID) | ORAL | Status: DC | PRN

## 2022-10-16 MED ORDER — LOPERAMIDE HCL 2 MG PO CAPS: 2.0000 mg | ORAL_CAPSULE | ORAL | Status: DC | PRN

## 2022-10-16 MED ORDER — POTASSIUM CHLORIDE CRYS ER 20 MEQ PO TBCR
40.0000 meq | EXTENDED_RELEASE_TABLET | Freq: Once | ORAL | Status: AC
  Administered 2022-10-16: 40 meq via ORAL
  Filled 2022-10-16: qty 2

## 2022-10-16 MED ORDER — LORAZEPAM 2 MG/ML IJ SOLN
2.0000 mg | Freq: Three times a day (TID) | INTRAMUSCULAR | Status: DC | PRN
  Administered 2022-10-16: 2 mg via INTRAMUSCULAR
  Filled 2022-10-16: qty 1

## 2022-10-16 MED ORDER — CHLORDIAZEPOXIDE HCL 25 MG PO CAPS
25.0000 mg | ORAL_CAPSULE | Freq: Four times a day (QID) | ORAL | Status: AC
  Administered 2022-10-16 – 2022-10-17 (×5): 25 mg via ORAL
  Filled 2022-10-16 (×6): qty 1

## 2022-10-16 MED ORDER — FOLIC ACID 1 MG PO TABS
1.0000 mg | ORAL_TABLET | Freq: Every day | ORAL | Status: DC
  Administered 2022-10-17 – 2022-10-23 (×6): 1 mg via ORAL
  Filled 2022-10-16 (×7): qty 1

## 2022-10-16 NOTE — Progress Notes (Signed)
RN called and spoke to charge nurse at [facility] ED.  RN informed charge nurse that pt is having to go back to ED.  Pt is shaking, says pain in abdomen is intense, diaphoretic.  Vitals: pulse at 118, and temperature 100.1. 4 mg IM Ativan administered per Dr. Adaline Sill orders.

## 2022-10-16 NOTE — ED Provider Notes (Signed)
[facility]-EMERGENCY DEPT Provider Note   CSN: [id number] Arrival date & time: 10/16/22  1907     History  No chief complaint on file.   Gary [name] is a 36 y.o. male.  The history is provided by the patient and medical records [facility] provider called before sending pt).  Alcohol Intoxication This is a recurrent problem. Associated symptoms include abdominal pain. Pertinent negatives include no chest pain, no headaches and no shortness of breath.       Home Medications Prior to Admission medications   Medication Sig Start Date End Date Taking? Authorizing Provider  bictegravir-emtricitabine-tenofovir AF (BIKTARVY) 50-200-25 MG TABS tablet Take 1 tablet by mouth daily. Patient not taking: Reported on 10/16/2022 05/28/22   Briant CedarEzenduka, Nkeiruka J, MD  chlordiazePOXIDE (LIBRIUM) 25 MG capsule Take 50mg  by mouth three times daily for 1 day, then 25-50mg  by mouth twice daily for 1 day, then 25-50mg  by mouth once daily for 1 day Patient not taking: Reported on 10/16/2022 09/17/22   Mare FerrariBelaya, Maria A, PA-C  folic acid (FOLVITE) 1 MG tablet Take 1 tablet (1 mg total) by mouth daily. Patient not taking: Reported on 06/18/2022 05/01/22   Kathlen ModyAkula, Vijaya, MD  gabapentin (NEURONTIN) 300 MG capsule Take 1 capsule (300 mg total) by mouth 3 (three) times daily. Patient not taking: Reported on 08/15/2022 05/01/22   Kathlen ModyAkula, Vijaya, MD  levETIRAcetam (KEPPRA) 500 MG tablet Take 1 tablet (500 mg total) by mouth 2 (two) times daily. 07/07/22   Gailen ShelterFondaw, Wylder S, PA  Multiple Vitamin (MULTIVITAMIN WITH MINERALS) TABS tablet Take 1 tablet by mouth daily. Patient not taking: Reported on 09/11/2022 05/02/22   Kathlen ModyAkula, Vijaya, MD  pantoprazole (PROTONIX) 40 MG tablet Take 1 tablet (40 mg total) by mouth 2 (two) times daily. Patient not taking: Reported on 05/21/2022 05/01/22   Kathlen ModyAkula, Vijaya, MD  thiamine 100 MG tablet Take 1 tablet (100 mg total) by mouth daily. Patient not taking: Reported on 08/15/2022  05/01/22   Kathlen ModyAkula, Vijaya, MD  famotidine (PEPCID) 20 MG tablet Take 1 tablet (20 mg total) by mouth 2 (two) times daily. Patient not taking: Reported on 06/01/2019 05/14/19 06/02/19  Benjiman CorePickering, Nathan, MD      Allergies    Tegretol [carbamazepine] and Caffeine    Review of Systems   Review of Systems  Constitutional:  Positive for diaphoresis.  HENT:  Negative for congestion.   Respiratory:  Negative for cough, chest tightness, shortness of breath and wheezing.   Cardiovascular:  Negative for chest pain, palpitations and leg swelling.  Gastrointestinal:  Positive for abdominal pain, nausea and vomiting. Negative for constipation and diarrhea.  Genitourinary:  Negative for dysuria.  Musculoskeletal:  Negative for back pain, neck pain and neck stiffness.  Skin:  Negative for rash and wound.  Neurological:  Positive for tremors. Negative for weakness, light-headedness, numbness and headaches.  Psychiatric/Behavioral:  Positive for hallucinations.   All other systems reviewed and are negative.   Physical Exam Updated Vital Signs BP 112/81 (BP Location: Left Arm)   Pulse (!) 118   Temp 100.1 F (37.8 C) (Oral)   Resp 20  Physical Exam Vitals and nursing note reviewed.  Constitutional:      General: He is not in acute distress.    Appearance: He is well-developed. He is ill-appearing and diaphoretic (slightly). He is not toxic-appearing.  HENT:     Head: Normocephalic and atraumatic.     Nose: No congestion or rhinorrhea.     Mouth/Throat:  Mouth: Mucous membranes are dry.     Pharynx: No oropharyngeal exudate or posterior oropharyngeal erythema.  Eyes:     Extraocular Movements: Extraocular movements intact.     Conjunctiva/sclera: Conjunctivae normal.     Pupils: Pupils are equal, round, and reactive to light.  Cardiovascular:     Rate and Rhythm: Regular rhythm. Tachycardia present.     Heart sounds: No murmur heard. Pulmonary:     Effort: Pulmonary effort is normal. No  respiratory distress.     Breath sounds: Normal breath sounds. No wheezing, rhonchi or rales.  Chest:     Chest wall: No tenderness.  Abdominal:     General: Abdomen is flat.     Palpations: Abdomen is soft.     Tenderness: There is no abdominal tenderness. There is no guarding or rebound.  Musculoskeletal:        General: No swelling or tenderness.     Cervical back: Neck supple. No tenderness.  Skin:    General: Skin is warm.     Capillary Refill: Capillary refill takes less than 2 seconds.     Findings: No erythema or rash.  Neurological:     General: No focal deficit present.     Mental Status: He is alert.  Psychiatric:        Attention and Perception: He perceives auditory and visual hallucinations.        Mood and Affect: Mood is anxious.        Thought Content: Thought content does not include homicidal or suicidal ideation.     Comments: He passively reports he is people that he wants to hurt but nobody here.  Denies SI.     ED Results / Procedures / Treatments   Labs (all labs ordered are listed, but only abnormal results are displayed) Labs Reviewed  CBC WITH DIFFERENTIAL/PLATELET - Abnormal; Notable for the following components:      Result Value   WBC 3.3 (*)    RBC 3.68 (*)    Hemoglobin 12.5 (*)    HCT 37.1 (*)    MCV 100.8 (*)    Platelets 108 (*)    All other components within normal limits  COMPREHENSIVE METABOLIC PANEL - Abnormal; Notable for the following components:   Glucose, Bld 114 (*)    Albumin 3.3 (*)    AST 140 (*)    ALT 75 (*)    All other components within normal limits  LIPASE, BLOOD - Abnormal; Notable for the following components:   Lipase 220 (*)    All other components within normal limits  HEMOGLOBIN A1C  LIPID PANEL  TSH    EKG EKG Interpretation  Date/Time:  Friday October 16 2022 20:48:33 EST Ventricular Rate:  80 PR Interval:  132 QRS Duration: 90 QT Interval:  376 QTC Calculation: 433 R Axis:   57 Text  Interpretation: Normal sinus rhythm Minimal voltage criteria for LVH, may be normal variant ( Sokolow-Lyon ) Septal infarct , age undetermined Abnormal ECG When compared with ECG of 16-Oct-2022 13:50, PREVIOUS ECG IS PRESENT when compared to prior, similar appearance. No STEMI Confirmed by Theda Belfast ([id number]) on 10/16/2022 9:06:27 PM  Radiology No results found.  Procedures Procedures    CRITICAL CARE Performed by: Canary Brim Golden Emile Total critical care time: 30 minutes Critical care time was exclusive of separately billable procedures and treating other patients. Critical care was necessary to treat or prevent imminent or life-threatening deterioration. Critical care was time spent personally by me  on the following activities: development of treatment plan with patient and/or surrogate as well as nursing, discussions with consultants, evaluation of patient's response to treatment, examination of patient, obtaining history from patient or surrogate, ordering and performing treatments and interventions, ordering and review of laboratory studies, ordering and review of radiographic studies, pulse oximetry and re-evaluation of patient's condition.   Medications Ordered in ED Medications  acetaminophen (TYLENOL) tablet 650 mg (has no administration in time range)  alum & mag hydroxide-simeth (MAALOX/MYLANTA) 200-200-20 MG/5ML suspension 30 mL (has no administration in time range)  magnesium hydroxide (MILK OF MAGNESIA) suspension 30 mL (has no administration in time range)  thiamine (VITAMIN B1) tablet 100 mg (has no administration in time range)  multivitamin with minerals tablet 1 tablet (1 tablet Oral Given 10/16/22 2040)  LORazepam (ATIVAN) tablet 1 mg (has no administration in time range)  loperamide (IMODIUM) capsule 2-4 mg (has no administration in time range)  ondansetron (ZOFRAN-ODT) disintegrating tablet 4 mg (has no administration in time range)  traZODone (DESYREL) tablet 50  mg (has no administration in time range)  LORazepam (ATIVAN) tablet 1 mg (1 mg Oral Not Given 10/16/22 2217)    Followed by  LORazepam (ATIVAN) tablet 1 mg (has no administration in time range)    Followed by  LORazepam (ATIVAN) tablet 1 mg (has no administration in time range)    Followed by  LORazepam (ATIVAN) tablet 1 mg (has no administration in time range)  LORazepam (ATIVAN) injection 2 mg (2 mg Intramuscular Given 10/16/22 2215)  levETIRAcetam (KEPPRA) tablet 500 mg (500 mg Oral Given 10/16/22 2040)  LORazepam (ATIVAN) 2 MG/ML injection (4 mg  Given 10/16/22 1716)  sodium chloride 0.9 % bolus 1,000 mL (1,000 mLs Intravenous New Bag/Given 10/16/22 2041)    ED Course/ Medical Decision Making/ A&P                           Medical Decision Making Amount and/or Complexity of Data Reviewed Labs: ordered.  Risk Decision regarding hospitalization.    Gary [name] is a 36 y.o. male with a past medical history significant for HIV, previous subdural hematoma, PTSD, anxiety, alcohol abuse with previous alcohol withdrawal seizures, epilepsy, recent pancreatitis, and bipolar disorder who presents from the behavioral health urgent care for acute alcohol withdrawal for admission and further management.  According to the provider at behavioral urgent care, she called to report that patient checked in for substance help and withdrawals.  He has not had a drink in approximately 24 hours.  She said that he was sweaty, shaking, having tactile visual and auditory hallucinations, having nausea, and was tachycardic.  They felt he was acutely withdrawing and needed to be admitted to hospital for DTs as he has in the past.  Patient reports that he does indeed think he is having acute alcohol drawl.  He reports that every morning he is awoken by the start of alcohol withdrawal and he has to drink either liquor, beer, or wine at his bedside table so that he does not have withdrawals.  He reports he is not  having to drink alcohol wise in about 24 hours due to the recent pancreatitis found yesterday with nausea and vomiting.  He said he finally wants to get clean and wants help.  He denies suicidal ideation and does not want hurt himself as he wants help but he does report he has other people he has thought about hurting but no active  plan at this time.  He reports he is having tactile hallucinations feeling that bugs are crawling on him and he also is having some shadows he is seeing and hearing voices behind him at times.  He is denying any fevers or chills but was found to have ginger 100.1 at the urgent care.  He denies any cough, congestion, or urinary symptoms.  He denies any acute headache or neck pain or neck stiffness.  He reports that he has not been taking his HIV medication for several weeks as he ran out of it.  Otherwise he denies infectious symptoms and thinks that this is withdrawal.  On my exam, patient is extremely tremulous and has tremors and a flapping hand movement when he extends his hands.  He had clear breath sounds and nontender chest.  Abdomen was not focally tender but he had some soreness from the vomiting earlier.  Patient otherwise was moving extremities and had intact sensation and strength.  Symmetric smile.  Clear speech.  Pupils are symmetric and reactive.  Clinically I am very concerned about acute alcohol withdrawals in this patient.  He is already seeing some Ativan at the [facility] urgent care and he is still clearly withdrawing.  Will get screening labs.  Psychiatry is already ordered medications for him for withdrawals however I do feel he will likely need admission for withdrawal management until he can be medically cleared and further managed by psychiatry.  9:44 PM Patient's CMP shows elevated LFTs as expected but otherwise electrolytes overall reassuring.  Patient has low white blood cell count and hemoglobin similar to prior.  Lipase is similarly elevated at  220.  Due to the significant withdrawals, will call for admission to medicine for further monitoring and management during acute alcohol withdrawal.        Final Clinical Impression(s) / ED Diagnoses Final diagnoses:  Alcohol withdrawal syndrome with complication (HCC)    Clinical Impression: 1. Alcohol withdrawal syndrome with complication (HCC)     Disposition: Admit  This note was prepared with assistance of Dragon voice recognition software. Occasional wrong-word or sound-a-like substitutions may have occurred due to the inherent limitations of voice recognition software.     Yenesis Even, Canary Brim, MD 10/16/22 2224

## 2022-10-16 NOTE — Progress Notes (Signed)
Pt was accepted to [facility] TODAY 10/16/2022. Bed assignment: 403-1  Pt meets inpatient criteria per Nira Conn, NP  Attending Physician will be Phineas Inches, MD  Report can be called to: -Adult unit: [phone]  Pt can arrive after 3:30 PM  Care Team Notified: Nira Conn, NP, [facility] Sharyne Peach, RN, Roddie Mc, RN Katheran James, RN, and [address] LCSWA  [place], [place]  10/16/2022 3:24 PM

## 2022-10-16 NOTE — ED Triage Notes (Signed)
Per EMS- patient is from [facility]. Patient having alcohol withdrawal symptoms. Patient was given Ativan 4 mg IM by [facility] staff prior to arrival to the ED.

## 2022-10-16 NOTE — ED Notes (Signed)
This Clinical research associate and RN attempted to dress pt out into burgundy scrubs. Pt too drowsy/sleepy. Pt responds to verbal cues but won't fully wake up. Most of pt's personal belonging's are collected and placed in 3 personal belonging's bag. Bags are labeled and placed in cabinets rm 1-8. Will hold off dressing out and re-attempt when appropriate. Scrubs at bedside

## 2022-10-16 NOTE — H&P (Signed)
History and Physical    Gary [name] HEK:[id number] DOB: [birth year] DOA: 10/16/2022  PCP: Patient, No Pcp Per  Patient coming from: Behavioral health  I have personally briefly reviewed patient's old medical records in [place]  Chief Complaint: Alcohol withdrawal  HPI: Gary [name] is a 36 y.o. male with medical history significant for alcohol dependence with history of withdrawal, history of seizure, HIV, depression/anxiety, history of alcohol associated pancreatitis, mood disorder who presented to the ED due to concern for alcohol withdrawal.  Patient was admitted to behavioral health earlier today for alcohol intoxication and depression.  On arrival there was concern for withdrawal and he was quickly sent back to the [facility] long ED.   Patient states that he drinks "a lot" at least half a gallon of vodka daily plus beer and wine.  He says he drinks as much alcohol as he can get a hold of throughout the day.  Last drink was sometime 10/15/2022.  He has been feeling withdrawal symptoms with tremors, diaphoresis, palpitations, nausea, vomiting.  He reports auditory hallucinations.  He reported to the ED provider yesterday that he "wants to just drink himself to death" and he was placed under IVC.  Patient is homeless.  He states he has no family/close contacts available.  He denies recreational drug use.  ED Course  Labs/Imaging on admission:  I have personally reviewed following labs and imaging studies.  Patient was not formally discharged from behavioral health therefore a new encounter had to be created for admission after initial ED workup as documented below.  Initial vitals showed BP 112/81, pulse 118, RR 20, temp 100.1 F, SpO2 87% on room air.  Labs show sodium 136, potassium 4.5, bicarb 25, BUN 6, creatinine 0.77, serum glucose 114, AST 140, ALT 75, alk phos 70, total bilirubin 0.8, lipase 220.  Patient was given 1 L normal saline and started on CIWA protocol.   He has been given 2 mg IV Ativan and 1 mg of oral Ativan.  He has been placed under IVC.  The hospitalist service was consulted to admit for further evaluation and management.  Review of Systems: All systems reviewed and are negative except as documented in history of present illness above.   Past Medical History:  Diagnosis Date   Alcohol abuse    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   Anxiety    Bipolar 2 disorder (HCC)    HIV (human immunodeficiency virus infection) ([place])    Pancreatitis    PTSD (post-traumatic stress disorder)    Schizophrenia ([place])    Seizures ([place])    Subdural hematoma (HCC)     Past Surgical History:  Procedure Laterality Date   BIOPSY  04/19/2022   Procedure: BIOPSY;  Surgeon: Irving Copas., MD;  Location: [facility] ENDOSCOPY;  Service: Gastroenterology;;   ENTEROSCOPY N/A 04/19/2022   Procedure: ENTEROSCOPY;  Surgeon: Irving Copas., MD;  Location: [place];  Service: Gastroenterology;  Laterality: N/A;   INCISION AND DRAINAGE PERIRECTAL ABSCESS N/A 09/24/2016   Procedure: IRRIGATION AND DEBRIDEMENT PERIRECTAL ABSCESS;  Surgeon: Clayburn Pert, MD;  Location: [facility] ORS;  Service: General;  Laterality: N/A;   none      Social History:  reports that he has been smoking cigarettes. He has been smoking an average of .5 packs per day. He has never used smokeless tobacco. He reports current alcohol use of about 105.0 standard drinks of alcohol per week. He reports current drug use. Drug: Methamphetamines.  Allergies  Allergen Reactions  Tegretol [Carbamazepine] Other (See Comments)    Caused vertigo for 2 days after taking it   Caffeine Palpitations    Family History  Problem Relation Age of Onset   Alcohol abuse Mother    Alcohol abuse Father    Colon cancer Other    Other Other    Cancer Other      Prior to Admission medications   Medication Sig Start Date End Date Taking? Authorizing Provider   bictegravir-emtricitabine-tenofovir AF (BIKTARVY) 50-200-25 MG TABS tablet Take 1 tablet by mouth daily. Patient not taking: Reported on 10/16/2022 05/28/22   Alma Friendly, MD  chlordiazePOXIDE (LIBRIUM) 25 MG capsule Take 67m by mouth three times daily for 1 day, then 25-567mby mouth twice daily for 1 day, then 25-5076my mouth once daily for 1 day Patient not taking: Reported on 10/16/2022 09/17/22   BelGarald BaldingA-C  folic acid (FOLVITE) 1 MG tablet Take 1 tablet (1 mg total) by mouth daily. Patient not taking: Reported on 06/18/2022 05/01/22   AkuHosie PoissonD  gabapentin (NEURONTIN) 300 MG capsule Take 1 capsule (300 mg total) by mouth 3 (three) times daily. Patient not taking: Reported on 08/15/2022 05/01/22   AkuHosie PoissonD  levETIRAcetam (KEPPRA) 500 MG tablet Take 1 tablet (500 mg total) by mouth 2 (two) times daily. 07/07/22   FonTedd SiasA  Multiple Vitamin (MULTIVITAMIN WITH MINERALS) TABS tablet Take 1 tablet by mouth daily. Patient not taking: Reported on 09/11/2022 05/02/22   AkuHosie PoissonD  pantoprazole (PROTONIX) 40 MG tablet Take 1 tablet (40 mg total) by mouth 2 (two) times daily. Patient not taking: Reported on 05/21/2022 05/01/22   AkuHosie PoissonD  thiamine 100 MG tablet Take 1 tablet (100 mg total) by mouth daily. Patient not taking: Reported on 08/15/2022 05/01/22   AkuHosie PoissonD  famotidine (PEPCID) 20 MG tablet Take 1 tablet (20 mg total) by mouth 2 (two) times daily. Patient not taking: Reported on 06/01/2019 05/14/19 06/02/19  PicDavonna BellingD    Physical Exam: There were no vitals filed for this visit.  Constitutional: Disheveled man, appears older than stated age, sitting up in bed Eyes: EOMI, lids and conjunctivae normal ENMT: Mucous membranes are dry. Posterior pharynx clear of any exudate or lesions.Normal dentition.  Neck: normal, supple, no masses. Respiratory: clear to auscultation bilaterally, no wheezing, no crackles. Normal  respiratory effort. No accessory muscle use.  Cardiovascular: Regular rate and rhythm, no murmurs / rubs / gallops. No extremity edema. 2+ pedal pulses. Abdomen: no tenderness, no masses palpated.  Musculoskeletal: no clubbing / cyanosis. No joint deformity upper and lower extremities. Good ROM, no contractures. Normal muscle tone.  Skin: no rashes, lesions, ulcers. No induration Neurologic: Tremors both upper extremities.  CN 2-12 grossly intact. Sensation intact. Strength 5/5 in all 4.  Psychiatric: Alert and oriented x 3.  EKG: Personally reviewed. Sinus rhythm, rate 80, no acute ischemic changes.  Similar to prior.  Assessment/Plan Principal Problem:   Alcohol dependence with withdrawal (HCC) Active Problems:   History of seizures   Substance induced mood disorder (HCC)   HIV disease (HCC)   Thrombocytopenia (HCCRefugio Elevated LFTs   TonJAMARKUS [name] a 36 24o. male with medical history significant for alcohol dependence with history of withdrawal, history of seizure, HIV, depression/anxiety, history of alcohol associated pancreatitis, mood disorder who is admitted with alcohol withdrawal.  Assessment and Plan: * Alcohol dependence with withdrawal ([place] alcohol dependence admitted  with acute withdrawal.  Last drink 12/7.  History of seizures.  High risk for DTs. -Admit to stepdown -Placed on CIWA protocol with Ativan prn -Start Librium taper -Continue IV fluid hydration overnight -Thiamine, MVM, folate -Low threshold to give additional Ativan prn or to start on Precedex if developing severe withdrawal  History of seizures Continue Keppra 500 mg twice daily.  Substance induced mood disorder ([place]) Placed under IVC due to report that "he wants to just drink himself to death."  Initially admitted to behavioral health but immediately sent back to the ED due to withdrawals.  Will need psychiatry reevaluation once medically stabilized.  Elevated LFTs Mild AST and ALT  elevation consistent with alcohol use pattern.  Lipase 220 but not having much abdominal pain and tolerating orals, lower than previous when he was admitted for acute pancreatitis.  Thrombocytopenia (HCC) Mild without obvious bleeding.  Continue to monitor.  HIV disease ([place]) Reports nonadherence to medications.  Viral RNA 2230 on 09/12/2022, CD4 count 512 on 06/25/2022.  Restart Biktarvy.  DVT prophylaxis: enoxaparin (LOVENOX) injection 40 mg Start: 10/16/22 2300 Code Status: Full code Family Communication: None available Disposition Plan: Pending clinical progress, anticipate inpatient psychiatry Consults called: None Severity of Illness: The appropriate patient status for this patient is INPATIENT. Inpatient status is judged to be reasonable and necessary in order to provide the required intensity of service to ensure the patient's safety. The patient's presenting symptoms, physical exam findings, and initial radiographic and laboratory data in the context of their chronic comorbidities is felt to place them at high risk for further clinical deterioration. Furthermore, it is not anticipated that the patient will be medically stable for discharge from the hospital within 2 midnights of admission.   * I certify that at the point of admission it is my clinical judgment that the patient will require inpatient hospital care spanning beyond 2 midnights from the point of admission due to high intensity of service, high risk for further deterioration and high frequency of surveillance required.Zada Finders MD [facility]  If 7PM-7AM, please contact night-coverage [email or web address]  10/16/2022, 11:20 PM

## 2022-10-16 NOTE — Assessment & Plan Note (Signed)
Placed under IVC due to report that "he wants to just drink himself to death."  Initially admitted to behavioral health but immediately sent back to the ED due to withdrawals.  Will need psychiatry reevaluation once medically stabilized.

## 2022-10-16 NOTE — ED Triage Notes (Deleted)
  The note originally documented on this encounter has been moved the the encounter in which it belongs.  

## 2022-10-16 NOTE — Progress Notes (Signed)
Patient was noted to be tremulous, sweating, unsteady of gait, red skin. He complains of abdominal pain, states he has history of recurrent pancreatitis. Relays that he has had to be detoxed in the hospital witn 5 days of IV sedation previously. His last drink was yesterday.   Spoke with MD on call from [facility] ER for doc to doc, relayed the patient condition, and plan to send him back to the ER.

## 2022-10-16 NOTE — ED Triage Notes (Signed)
Pt sent from [facility] for admission for whithdrawal. See previous chart for ED work up. New encounter had to be started for hospital admission.

## 2022-10-16 NOTE — Hospital Course (Signed)
Gary [name] is a 36 y.o. male with medical history significant for alcohol dependence with history of withdrawal, history of seizure, HIV, depression/anxiety, history of alcohol associated pancreatitis, mood disorder who is admitted with alcohol withdrawal.

## 2022-10-16 NOTE — Consult Note (Signed)
BH ED ASSESSMENT   Reason for Consult:  SI Referring Physician:  Chaney Malling, MD  Patient Identification: Gary [name] MRN:  [id number] ED Chief Complaint: Alcohol intoxication with moderate or severe use disorder, with unspecified complication (HCC)  Diagnosis:  Principal Problem:   Alcohol intoxication with moderate or severe use disorder, with unspecified complication (HCC) Active Problems:   Alcohol-induced mood disorder (HCC)   Alcohol withdrawal [facility])   ED Assessment Time Calculation: Start Time: 0945 Stop Time: 1010 Total Time in Minutes (Assessment Completion): 25   Subjective:   Gary [name] is a 36 y.o. male patient admitted with a history of HIV, depression, alcohol abuse, and seizures. He presents with alcohol intoxication and depression.  Patient is homeless and has been drinking alcohol chronically.  Patient was admitted about a month ago for alcohol pancreatitis.  Patient continues to drink about 1.5 gallons of vodka a day.  HPI:  The patient presents today with complaints of feeling rough and experiencing withdrawal symptoms. He describes symptoms such as tingling in his feet and fingers, itching all over, stomach cramps, headaches, and tremors. The patient discloses a history of withdrawal seizures, and he has been previously hospitalized for severe withdrawals. He reports being prescribed Keppra and Biktarvy for seizures and HIV, but he admits to not consistently taking them. Keppra was restarted last night and administered. The patient shares that he used to work in [employer] but is now applying for disability due to difficulties in maintaining employment. He discloses his homeless status. He denies substance use, except for chronic alcohol consumption, drinking about 1.5 gallon/day, and occasional nicotine use. The patient denies auditory hallucinations but acknowledges visual hallucinations, describing the presence of a character in the corner with a bag. He notes  that this typically occurs during withdrawal episodes. He denies suicidal ideations but acknowledges homicidal ideations without a specific plan. The patient reports a decreased appetite, mentioning that he eats only once a day, and he describes poor sleep, stating that he only gets restful sleep when he is intoxicated.   On evaluation, the patient's physical presentation appeared disheveled. His behavior was cooperative. When speaking, his speech was at a normal rate and volume. His mood was anxious, depressed, hopeless, and worthless. His affect was congruent, primarily depressed. The patient's thought process was coherent and linear, and his thought content was logical. There were no signs or indications that he was currently reacting to internal stimuli. The patient denied experiencing auditory hallucinations but endorsed visual hallucinations, seeing a character in the corner with a bag. He denies suicidal ideations but endorses homicidal ideations with no plan.   Past Psychiatric History:  Previous Medication Trials: doxepin, gabapentin Previous Psychiatric Hospitalizations: yes, severalabove Previous Suicide Attempts: yes  Outpatient psychiatrist: no   Risk to Self or Others: Is the patient at risk to self? Yes Has the patient been a risk to self in the past 6 months? Yes Has the patient been a risk to self within the distant past? Yes Is the patient a risk to others? No Has the patient been a risk to others in the past 6 months? No Has the patient been a risk to others within the distant past? No  [place] Scale:  Flowsheet Row ED from 10/14/2022 in [facility] from 09/16/2022 in [facility] to Hosp-Admission (Discharged) from 09/11/2022 in [facility] 4E CV SURGICAL PROGRESSIVE CARE  C-SSRS RISK CATEGORY No Risk No Risk No Risk  AIMS:  , , ,  ,   ASAM: ASAM Multidimensional Assessment Summary DImension 1:  Acute  Intoxication and/or Withdrawal Potential Severity Rating: Very Severe Dimension 2:  Biomedical Conditions and Complications Severity Rating: Severe Dimension 3:  Description of emotional, behavioral, or cognitive conditions and complications: Pt attempted suicide twice today (08/25/2021) he tried drinking himself to death then "played" in traffic. Dimension 3:  Emotional, behavioral or cognitive (EBC) conditions and complications severity rating: Severe Dimension 4:  Readiness to Change Severity Rating: Moderate Dimension 5:  Relapse, continued use, or continued problem potential critiera description: Patient has a history of chronic relapses. Dimension 5:  Relapse, continued use, or continued problem potential severity rating: Very Severe Dimension 6:  Recovery/living environment severity rating: Very Severe ASAM Recommended Level of Treatment: Level III Residential Treatment  Substance Abuse:  Alcohol / Drug Use Pain Medications: See MAR Prescriptions: See MAR Over the Counter: See MAR History of alcohol / drug use?: Yes Longest period of sobriety (when/how long): Per chart. 2 years Negative Consequences of Use: Financial, Legal, Personal relationships, Work / School Withdrawal Symptoms: Irritability, Nausea / Vomiting, Tremors, Weakness, Tingling, Seizures Onset of Seizures: Per chart, "during withdrawals."  Past Medical History:  Past Medical History:  Diagnosis Date   Alcohol abuse    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   Anxiety    Bipolar 2 disorder (HCC)    HIV (human immunodeficiency virus infection) (HCC)    Pancreatitis    PTSD (post-traumatic stress disorder)    Schizophrenia (HCC)    Seizures (HCC)    Subdural hematoma (HCC)     Past Surgical History:  Procedure Laterality Date   BIOPSY  04/19/2022   Procedure: BIOPSY;  Surgeon: Lemar Lofty., MD;  Location: [facility] ENDOSCOPY;  Service: Gastroenterology;;   ENTEROSCOPY N/A 04/19/2022   Procedure:  ENTEROSCOPY;  Surgeon: Lemar Lofty., MD;  Location: [facility] ENDOSCOPY;  Service: Gastroenterology;  Laterality: N/A;   INCISION AND DRAINAGE PERIRECTAL ABSCESS N/A 09/24/2016   Procedure: IRRIGATION AND DEBRIDEMENT PERIRECTAL ABSCESS;  Surgeon: Ricarda Frame, MD;  Location: [facility] ORS;  Service: General;  Laterality: N/A;   none     Family History:  Family History  Problem Relation Age of Onset   Alcohol abuse Mother    Alcohol abuse Father    Colon cancer Other    Other Other    Cancer Other    Family Psychiatric  History: Alcohol abuse Social History:  Social History   Substance and Sexual Activity  Alcohol Use Yes   Alcohol/week: 105.0 standard drinks of alcohol   Types: 105 Cans of beer per week     Social History   Substance and Sexual Activity  Drug Use Yes   Types: Methamphetamines   Comment: last used 04/10/2019    Social History   Socioeconomic History   Marital status: Single    Spouse name: Not on file   Number of children: Not on file   Years of education: Not on file   Highest education level: Not on file  Occupational History   Occupation: unemployed  Tobacco Use   Smoking status: Every Day    Packs/day: 0.50    Types: Cigarettes   Smokeless tobacco: Never   Tobacco comments:    unable to smoke while incarcerated 6+ months 02/13/20  Vaping Use   Vaping Use: Never used  Substance and Sexual Activity   Alcohol use: Yes    Alcohol/week: 105.0 standard drinks of alcohol    Types: 105 Cans  of beer per week   Drug use: Yes    Types: Methamphetamines    Comment: last used 04/10/2019   Sexual activity: Yes    Partners: Male, Male    Comment: declined condoms  03/2021  Other Topics Concern   Not on file  Social History Narrative   "Currently living on the streets"   Independent at baseline   Occasionally goes to the [facility]   Social Determinants of Health   Financial Resource Strain: Not on file  Food Insecurity: Food Insecurity Present  (04/27/2022)   Hunger Vital Sign    Worried About Running Out of Food in the Last Year: Sometimes true    Ran Out of Food in the Last Year: Sometimes true  Transportation Needs: Unmet Transportation Needs (04/27/2022)   PRAPARE - Administrator, Civil Service (Medical): Yes    Lack of Transportation (Non-Medical): Yes  Physical Activity: Not on file  Stress: Not on file  Social Connections: Not on file   Additional Social History:    Allergies:   Allergies  Allergen Reactions   Tegretol [Carbamazepine] Other (See Comments)    Caused vertigo for 2 days after taking it   Caffeine Palpitations    Labs:  Results for orders placed or performed during the hospital encounter of 10/15/22 (from the past 48 hour(s))  CBC with Differential     Status: Abnormal   Collection Time: 10/15/22  8:28 PM  Result Value Ref Range   WBC 3.2 (L) 4.0 - 10.5 K/uL   RBC 3.78 (L) 4.22 - 5.81 MIL/uL   Hemoglobin 12.9 (L) 13.0 - 17.0 g/dL   HCT 40.9 (L) 73.5 - 32.9 %   MCV 101.3 (H) 80.0 - 100.0 fL   MCH 34.1 (H) 26.0 - 34.0 pg   MCHC 33.7 30.0 - 36.0 g/dL   RDW 92.4 26.8 - 34.1 %   Platelets 123 (L) 150 - 400 K/uL   nRBC 0.0 0.0 - 0.2 %   Neutrophils Relative % 35 %   Neutro Abs 1.1 (L) 1.7 - 7.7 K/uL   Lymphocytes Relative 53 %   Lymphs Abs 1.7 0.7 - 4.0 K/uL   Monocytes Relative 8 %   Monocytes Absolute 0.2 0.1 - 1.0 K/uL   Eosinophils Relative 2 %   Eosinophils Absolute 0.1 0.0 - 0.5 K/uL   Basophils Relative 2 %   Basophils Absolute 0.1 0.0 - 0.1 K/uL   Immature Granulocytes 0 %   Abs Immature Granulocytes 0.01 0.00 - 0.07 K/uL    Comment: Performed at [facility], [address], [place], [place] [address]  Comprehensive metabolic panel     Status: Abnormal   Collection Time: 10/15/22  8:28 PM  Result Value Ref Range   Sodium 140 135 - 145 mmol/L   Potassium 3.3 (L) 3.5 - 5.1 mmol/L   Chloride 108 98 - 111 mmol/L   CO2 22 22 - 32 mmol/L   Glucose, Bld  142 (H) 70 - 99 mg/dL    Comment: Glucose reference range applies only to samples taken after fasting for at least 8 hours.   BUN 5 (L) 6 - 20 mg/dL   Creatinine, Ser 9.79 0.61 - 1.24 mg/dL   Calcium 8.4 (L) 8.9 - 10.3 mg/dL   Total Protein 7.6 6.5 - 8.1 g/dL   Albumin 3.9 3.5 - 5.0 g/dL   AST 892 (H) 15 - 41 U/L   ALT 77 (H) 0 - 44 U/L  Alkaline Phosphatase 76 38 - 126 U/L   Total Bilirubin 0.5 0.3 - 1.2 mg/dL   GFR, Estimated >26 >71 mL/min    Comment: (NOTE) Calculated using the CKD-EPI Creatinine Equation (2021)    Anion gap 10 5 - 15    Comment: Performed at [facility], [address], [place], [place] [address]  Ethanol     Status: Abnormal   Collection Time: 10/15/22  8:28 PM  Result Value Ref Range   Alcohol, Ethyl (B) 448 (HH) <10 mg/dL    Comment: CRITICAL RESULT CALLED TO, READ BACK BY AND VERIFIED WITH HARPER,J AT 2148 ON 10/15/22 BY VAZQUEZJ (NOTE) Lowest detectable limit for serum alcohol is 10 mg/dL.  For medical purposes only. Performed at [facility], [address], [place], [place] [address]   Lipase, blood     Status: Abnormal   Collection Time: 10/15/22  8:28 PM  Result Value Ref Range   Lipase 205 (H) 11 - 51 U/L    Comment: Performed at [facility], [address] [place], [place] [address]  Rapid urine drug screen (hospital performed)     Status: None   Collection Time: 10/15/22  8:28 PM  Result Value Ref Range   Opiates NONE DETECTED NONE DETECTED   Cocaine NONE DETECTED NONE DETECTED   Benzodiazepines NONE DETECTED NONE DETECTED   Amphetamines NONE DETECTED NONE DETECTED   Tetrahydrocannabinol NONE DETECTED NONE DETECTED   Barbiturates NONE DETECTED NONE DETECTED    Comment: (NOTE) DRUG SCREEN FOR MEDICAL PURPOSES ONLY.  IF CONFIRMATION IS NEEDED FOR ANY PURPOSE, NOTIFY LAB WITHIN 5 DAYS.  LOWEST DETECTABLE LIMITS FOR URINE DRUG SCREEN Drug Class                     Cutoff  (ng/mL) Amphetamine and metabolites    1000 Barbiturate and metabolites    200 Benzodiazepine                 200 Opiates and metabolites        300 Cocaine and metabolites        300 THC                            50 Performed at [facility], [address] [place], [place] [address]     Current Facility-Administered Medications  Medication Dose Route Frequency Provider Last Rate Last Admin   folic acid (FOLVITE) tablet 1 mg  1 mg Oral Daily Charlynne Pander, MD   1 mg at 10/16/22 3419   levETIRAcetam (KEPPRA) tablet 500 mg  500 mg Oral BID Charlynne Pander, MD   500 mg at 10/16/22 0940   LORazepam (ATIVAN) injection 0-4 mg  0-4 mg Intravenous Q4H Charlynne Pander, MD   2 mg at 10/16/22 3790   Followed by   Melene Muller ON 10/17/2022] LORazepam (ATIVAN) injection 0-4 mg  0-4 mg Intravenous Loreen Freud, MD       LORazepam (ATIVAN) tablet 1-4 mg  1-4 mg Oral Q1H PRN Charlynne Pander, MD       Or   LORazepam (ATIVAN) injection 1-4 mg  1-4 mg Intravenous Q1H PRN Charlynne Pander, MD       multivitamin with minerals tablet 1 tablet  1 tablet Oral Daily Charlynne Pander, MD   1 tablet at 10/16/22 0939   pantoprazole (PROTONIX) EC tablet 40  mg  40 mg Oral BID Charlynne PanderYao, David Hsienta, MD   40 mg at 10/16/22 0940   thiamine (VITAMIN B1) tablet 100 mg  100 mg Oral Daily Charlynne PanderYao, David Hsienta, MD   100 mg at 10/16/22 0940   Or   thiamine (VITAMIN B1) injection 100 mg  100 mg Intravenous Daily Charlynne PanderYao, David Hsienta, MD   100 mg at 10/15/22 2204   Current Outpatient Medications  Medication Sig Dispense Refill   bictegravir-emtricitabine-tenofovir AF (BIKTARVY) 50-200-25 MG TABS tablet Take 1 tablet by mouth daily. (Patient not taking: Reported on 09/11/2022) 30 tablet 0   chlordiazePOXIDE (LIBRIUM) 25 MG capsule Take 50mg  by mouth three times daily for 1 day, then 25-50mg  by mouth twice daily for 1 day, then 25-50mg  by mouth once daily for 1 day 10 capsule 0   folic acid  (FOLVITE) 1 MG tablet Take 1 tablet (1 mg total) by mouth daily. (Patient not taking: Reported on 06/18/2022) 30 tablet 3   gabapentin (NEURONTIN) 300 MG capsule Take 1 capsule (300 mg total) by mouth 3 (three) times daily. (Patient not taking: Reported on 08/15/2022) 90 capsule 3   levETIRAcetam (KEPPRA) 500 MG tablet Take 1 tablet (500 mg total) by mouth 2 (two) times daily. (Patient not taking: Reported on 09/11/2022) 60 tablet 0   Multiple Vitamin (MULTIVITAMIN WITH MINERALS) TABS tablet Take 1 tablet by mouth daily. (Patient not taking: Reported on 09/11/2022) 30 tablet 3   pantoprazole (PROTONIX) 40 MG tablet Take 1 tablet (40 mg total) by mouth 2 (two) times daily. (Patient not taking: Reported on 05/21/2022) 60 tablet 3   thiamine 100 MG tablet Take 1 tablet (100 mg total) by mouth daily. (Patient not taking: Reported on 08/15/2022) 30 tablet 3    Musculoskeletal: Strength & Muscle Tone: within normal limits Gait & Station: normal Patient leans: N/A   Psychiatric Specialty Exam: Presentation  General Appearance:  Disheveled  Eye Contact: Good  Speech: Clear and Coherent; Normal Rate  Speech Volume: Decreased  Handedness: Right   Mood and Affect  Mood: Anxious; Depressed; Hopeless; Worthless  Affect: Congruent; Depressed   Thought Process  Thought Processes: Coherent; Linear  Descriptions of Associations:Intact  Orientation:Full (Time, Place and Person)  Thought Content:Logical  History of Schizophrenia/Schizoaffective disorder:Yes  Duration of Psychotic Symptoms:No data recorded Hallucinations:Hallucinations: Auditory; Visual Description of Visual Hallucinations: VH: a character in the corner of the room  Ideas of Reference:None  Suicidal Thoughts:Suicidal Thoughts: No  Homicidal Thoughts:Homicidal Thoughts: No   Sensorium  Memory: Immediate Good; Recent Good; Remote Good  Judgment: Impaired  Insight: Fair   Art therapistxecutive Functions   Concentration: Fair  Attention Span: Fair  Recall: Good  Fund of Knowledge: Good  Language: Good   Psychomotor Activity  Psychomotor Activity: Psychomotor Activity: Normal   Assets  Assets: Communication Skills; Desire for Improvement    Sleep  Sleep: Sleep: Fair   Physical Exam: Physical Exam Constitutional:      General: He is not in acute distress.    Appearance: He is ill-appearing. He is not toxic-appearing or diaphoretic.  Eyes:     General:        Right eye: No discharge.        Left eye: No discharge.  Cardiovascular:     Rate and Rhythm: Normal rate.  Pulmonary:     Effort: Pulmonary effort is normal. No respiratory distress.  Musculoskeletal:        General: Normal range of motion.     Cervical back: Normal  range of motion.  Skin:    General: Skin is warm and dry.  Neurological:     Mental Status: He is alert and oriented to person, place, and time.  Psychiatric:        Behavior: Behavior is cooperative.        Thought Content: Thought content is not paranoid. Thought content does not include homicidal or suicidal ideation.    Review of Systems  Constitutional:  Negative for diaphoresis.  Respiratory:  Negative for cough and shortness of breath.   Cardiovascular:  Negative for chest pain.  Gastrointestinal:  Negative for diarrhea, nausea and vomiting.  Neurological:  Positive for tremors and headaches.  Psychiatric/Behavioral:  Positive for depression, hallucinations, substance abuse and suicidal ideas. Negative for memory loss. The patient is nervous/anxious and has insomnia.    Blood pressure (!) 141/100, pulse 80, temperature 98.1 F (36.7 C), temperature source Axillary, resp. rate 18, SpO2 100 %. There is no height or weight on file to calculate BMI.  Medical Decision Making: Gary [name] is a 36 y.o. male patient admitted with a history of HIV, depression, alcohol abuse, and seizures. He presents with alcohol intoxication and  depression.  Patient is homeless and has been drinking alcohol chronically.  Patient was admitted about a month ago for alcohol pancreatitis.  Patient continues to drink about 1.5 gallons of vodka a day.  Keppra 500 mg oral BID for seizure disorder was restarted on 10/15/22 by EDP. Patient also receive keppra intravenous loading dose on 10/15/22   Continue ativan CIWA protocol Continue thiamine 100 mg daily for nutritional supplementation f/t severe alcohol use     Disposition: Recommend psychiatric Inpatient admission when medically cleared.  Jackelyn Poling, NP 10/16/2022 10:28 AM

## 2022-10-16 NOTE — Assessment & Plan Note (Signed)
Mild AST and ALT elevation consistent with alcohol use pattern.  Lipase 220 but not having much abdominal pain and tolerating orals, lower than previous when he was admitted for acute pancreatitis.

## 2022-10-16 NOTE — Assessment & Plan Note (Signed)
Mild without obvious bleeding.  Continue to monitor. 

## 2022-10-16 NOTE — Assessment & Plan Note (Signed)
Continue Keppra 500 mg twice daily 

## 2022-10-16 NOTE — Assessment & Plan Note (Addendum)
Reports nonadherence to medications.  Viral RNA 2230 on 09/12/2022, CD4 count 512 on 06/25/2022.  Restart Biktarvy.

## 2022-10-16 NOTE — Progress Notes (Signed)
Pt meets criteria for inpatient treatment per Nira Conn, NP. Pt is currently under review at [facility]. Per [facility] AC Antoinette Cillo, RN, pt is required to have negative covid and supplement/redraw of potassium due to low levels. CSW will continue to assist and follow with placement.  Asencion Islam  10/16/2022 12:47 PM

## 2022-10-16 NOTE — Assessment & Plan Note (Signed)
Severe alcohol dependence admitted with acute withdrawal.  Last drink 12/7.  History of seizures.  High risk for DTs. -Admit to stepdown -Placed on CIWA protocol with Ativan prn -Start Librium taper -Continue IV fluid hydration overnight -Thiamine, MVM, folate -Low threshold to give additional Ativan prn or to start on Precedex if developing severe withdrawal

## 2022-10-17 ENCOUNTER — Encounter (HOSPITAL_COMMUNITY): Payer: Self-pay | Admitting: Internal Medicine

## 2022-10-17 DIAGNOSIS — F10931 Alcohol use, unspecified with withdrawal delirium: Secondary | ICD-10-CM

## 2022-10-17 DIAGNOSIS — F1994 Other psychoactive substance use, unspecified with psychoactive substance-induced mood disorder: Secondary | ICD-10-CM

## 2022-10-17 DIAGNOSIS — F10231 Alcohol dependence with withdrawal delirium: Principal | ICD-10-CM

## 2022-10-17 DIAGNOSIS — B20 Human immunodeficiency virus [HIV] disease: Secondary | ICD-10-CM

## 2022-10-17 LAB — COMPREHENSIVE METABOLIC PANEL
ALT: 39 U/L (ref 0–44)
ALT: 63 U/L — ABNORMAL HIGH (ref 0–44)
AST: 56 U/L — ABNORMAL HIGH (ref 15–41)
AST: 87 U/L — ABNORMAL HIGH (ref 15–41)
Albumin: 1.9 g/dL — ABNORMAL LOW (ref 3.5–5.0)
Albumin: 3.5 g/dL (ref 3.5–5.0)
Alkaline Phosphatase: 41 U/L (ref 38–126)
Alkaline Phosphatase: 70 U/L (ref 38–126)
Anion gap: 14 (ref 5–15)
Anion gap: 8 (ref 5–15)
BUN: <5 mg/dL — ABNORMAL LOW (ref 6–20)
BUN: <5 mg/dL — ABNORMAL LOW (ref 6–20)
CO2: 17 mmol/L — ABNORMAL LOW (ref 22–32)
CO2: 25 mmol/L (ref 22–32)
Calcium: 7.9 mg/dL — ABNORMAL LOW (ref 8.9–10.3)
Calcium: 9.5 mg/dL (ref 8.9–10.3)
Chloride: 102 mmol/L (ref 98–111)
Chloride: 101 mmol/L (ref 98–111)
Creatinine, Ser: 0.42 mg/dL — ABNORMAL LOW (ref 0.61–1.24)
Creatinine, Ser: 0.64 mg/dL (ref 0.61–1.24)
GFR, Estimated: >60 mL/min (ref >60)
GFR, Estimated: >60 mL/min (ref >60)
Glucose, Bld: 65 mg/dL — ABNORMAL LOW (ref 70–99)
Glucose, Bld: 108 mg/dL — ABNORMAL HIGH (ref 70–99)
Potassium: 3.9 mmol/L (ref 3.5–5.1)
Potassium: 3.9 mmol/L (ref 3.5–5.1)
Sodium: 133 mmol/L — ABNORMAL LOW (ref 135–145)
Sodium: 134 mmol/L — ABNORMAL LOW (ref 135–145)
Total Bilirubin: 0.9 mg/dL (ref 0.3–1.2)
Total Bilirubin: 1.2 mg/dL (ref 0.3–1.2)
Total Protein: 4.1 g/dL — ABNORMAL LOW (ref 6.5–8.1)
Total Protein: 7 g/dL (ref 6.5–8.1)

## 2022-10-17 LAB — CBC
HCT: 38.8 % — ABNORMAL LOW (ref 39.0–52.0)
Hemoglobin: 13.1 g/dL (ref 13.0–17.0)
MCH: 34 pg (ref 26.0–34.0)
MCHC: 33.8 g/dL (ref 30.0–36.0)
MCV: 100.8 fL — ABNORMAL HIGH (ref 80.0–100.0)
Platelets: 106 10*3/uL — ABNORMAL LOW (ref 150–400)
RBC: 3.85 MIL/uL — ABNORMAL LOW (ref 4.22–5.81)
RDW: 13.2 % (ref 11.5–15.5)
WBC: 2.6 10*3/uL — ABNORMAL LOW (ref 4.0–10.5)
nRBC: 0 % (ref 0.0–0.2)

## 2022-10-17 LAB — GLUCOSE, CAPILLARY: Glucose-Capillary: 97 mg/dL (ref 70–99)

## 2022-10-17 LAB — PHOSPHORUS: Phosphorus: 3.3 mg/dL (ref 2.5–4.6)

## 2022-10-17 LAB — MRSA NEXT GEN BY PCR, NASAL: MRSA by PCR Next Gen: NOT DETECTED

## 2022-10-17 LAB — MAGNESIUM: Magnesium: 1.8 mg/dL (ref 1.7–2.4)

## 2022-10-17 MED ORDER — DEXMEDETOMIDINE HCL IN NACL 200 MCG/50ML IV SOLN
0.4000 ug/kg/h | INTRAVENOUS | Status: DC
  Administered 2022-10-17: 0.4 ug/kg/h via INTRAVENOUS
  Administered 2022-10-18: 0.8 ug/kg/h via INTRAVENOUS
  Filled 2022-10-17 (×3): qty 50

## 2022-10-17 MED ORDER — LACTATED RINGERS IV SOLN: INTRAVENOUS | Status: DC

## 2022-10-17 MED ORDER — HALOPERIDOL LACTATE 5 MG/ML IJ SOLN
5.0000 mg | Freq: Four times a day (QID) | INTRAMUSCULAR | Status: DC | PRN
  Administered 2022-10-21: 5 mg via INTRAVENOUS
  Filled 2022-10-17: qty 1

## 2022-10-17 MED ORDER — CHLORHEXIDINE GLUCONATE CLOTH 2 % EX PADS
6.0000 | MEDICATED_PAD | Freq: Every day | CUTANEOUS | Status: DC
  Administered 2022-10-17 – 2022-10-22 (×4): 6 via TOPICAL

## 2022-10-17 MED ORDER — ORAL CARE MOUTH RINSE: 15.0000 mL | OROMUCOSAL | Status: DC | PRN

## 2022-10-17 NOTE — Consult Note (Signed)
Patient states he is too fatigued and lethargic for comprehensive psychiatric evaluation today. Will see patient again tomorrow  Thedore Mins, MD Attending psychiatrist

## 2022-10-17 NOTE — TOC Initial Note (Addendum)
Transition of Care [facility]) - Initial/Assessment Note    Patient Details  Name: Gary [name] MRN: [id number] Date of Birth: [birth year]  [facility] CM/SW Contact:    Golda Acre, RN Phone Number: 10/17/2022, 9:51 AM  Clinical Narrative:                 Homeless and etoh w/d , drug use and tobacco use.  Will add information about smoking cessation, drug use ,resources for getting help and homeless shelters to the dc instructions.  Expected Discharge Plan: Homeless Shelter Barriers to Discharge: Homeless with medical needs, Continued Medical Work up   Patient Goals and CMS Choice Patient states their goals for this hospitalization and ongoing recovery are:: unable to state at this time CMS Medicare.gov Compare Post Acute Care list provided to:: Patient Choice offered to / list presented to : Patient  Expected Discharge Plan and Services Expected Discharge Plan: Homeless Shelter   Discharge Planning Services: CM Consult   Living arrangements for the past 2 months: Homeless Shelter                                      Prior Living Arrangements/Services Living arrangements for the past 2 months: Homeless Shelter Lives with:: Self Patient language and need for interpreter reviewed:: Yes Do you feel safe going back to the place where you live?: Yes            Criminal Activity/Legal Involvement Pertinent to Current Situation/Hospitalization: No - Comment as needed  Activities of Daily Living Home Assistive Devices/Equipment: None ADL Screening (condition at time of admission) Patient's cognitive ability adequate to safely complete daily activities?: No Is the patient deaf or have difficulty hearing?: No Does the patient have difficulty seeing, even when wearing glasses/contacts?: No Does the patient have difficulty concentrating, remembering, or making decisions?: No Patient able to express need for assistance with ADLs?: Yes Does the patient  have difficulty dressing or bathing?: No Independently performs ADLs?: Yes (appropriate for developmental age) Does the patient have difficulty walking or climbing stairs?: No Weakness of Legs: None Weakness of Arms/Hands: None  Permission Sought/Granted                  Emotional Assessment Appearance:: Appears stated age Attitude/Demeanor/Rapport: Unable to Assess Affect (typically observed): Unable to Assess Orientation: : Fluctuating Orientation (Suspected and/or reported Sundowners) Alcohol / Substance Use: Alcohol Use, Tobacco Use, Illicit Drugs Psych Involvement: No (comment)  Admission diagnosis:  Alcohol dependence with withdrawal (HCC) [F10.239] Patient Active Problem List   Diagnosis Date Noted   Alcohol abuse with alcohol-induced mood disorder (HCC) 10/16/2022   Alcohol dependence with withdrawal (HCC) 10/16/2022   Elevated LFTs 10/16/2022   Acute alcoholic pancreatitis 09/11/2022   Alcohol withdrawal (HCC) 05/29/2022   AKI (acute kidney injury) (HCC) 05/28/2022   Homelessness 05/28/2022   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   Metabolic acidosis with increased anion gap and accumulation of organic acids 04/16/2022   History of seizures 04/16/2022   Tobacco abuse 04/16/2022   Macrocytosis 12/31/2021   Alcohol-induced insomnia (HCC)    MDD (major depressive disorder), recurrent episode, 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/26/2021   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   Alcohol abuse    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   Malnutrition of moderate degree 05/07/2020   Alcoholic ketoacidosis 05/05/2020   Thrombocytopenia (HCC) 07/25/2019   Transaminitis 07/25/2019   Traumatic subdural hematoma (HCC)  07/02/2019   Alcohol intoxication with moderate or severe use disorder, with unspecified complication ([place]) 0000000   Alcohol-induced mood disorder ([place]) 05/13/2019   Alcohol use disorder, severe, dependence ([place]) 04/16/2019   Major depressive disorder, recurrent severe without  psychotic features ([place]) 04/16/2019   HIV disease ([place]) 06/16/2018   Polysubstance abuse ([place]) 06/16/2018   Cigarette smoker 06/16/2018   PCP:  Patient, No Pcp Per Pharmacy:   [facility] [address] [place] [address] Phone: [phone] Fax: [place], [place] [address] Phone: [phone] Fax: [phone]     Social Determinants of Health (SDOH) Interventions    Readmission Risk Interventions   Row Labels 06/01/2022    1:14 PM 04/27/2022    9:42 AM  Readmission Risk Prevention Plan   Section Header. No data exists in this row.    Transportation Screening   Complete Complete  Medication Review Press photographer)   Referral to Pharmacy Referral to Pharmacy  PCP or Specialist appointment within 3-5 days of discharge   Complete Complete  HRI or [place]   Complete Complete  SW Recovery Care/Counseling Consult   Complete Complete  Palliative Care Screening   Not Applicable Not [place]   Not Applicable Not Applicable

## 2022-10-17 NOTE — Consult Note (Signed)
NAME:  Gary [name], MRN:  [id number], DOB:  11-17-1985, LOS: 1 ADMISSION DATE:  10/16/2022, CONSULTATION DATE: 10/17/2022 REFERRING MD: Dr. Jerral Ralph, CHIEF COMPLAINT: Alcohol withdrawal  History of Present Illness:  36 year old with a history of alcohol dependence, history of withdrawal with DTs in the past History of seizures, depression/anxiety alcoholic pancreatitis Admitted to behavioral health today for alcohol intoxication and depression Was transferred to the ED with concerns for withdrawal Drinks half a gallon of vodka daily plus beer and wine last drink 10/15/2022  He has tremors, diaphoretic, palpitations, nausea/vomiting, has had hallucinations Pertinent  Medical History   Past Medical History:  Diagnosis Date   Alcohol abuse    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   Anxiety    Bipolar 2 disorder (HCC)    HIV (human immunodeficiency virus infection) (HCC)    Pancreatitis    PTSD (post-traumatic stress disorder)    Schizophrenia (HCC)    Seizures (HCC)    Subdural hematoma (HCC)    Significant Hospital Events: Including procedures, antibiotic start and stop dates in addition to other pertinent events   PCCM asked to see to manage withdrawal  Interim History / Subjective:  Has been drinking heavy, tremulous  Objective   Blood pressure 106/74, pulse 88, temperature 98.6 F (37 C), temperature source Oral, resp. rate 14, height 6' (1.829 m), weight 69 kg, SpO2 100 %.        Intake/Output Summary (Last 24 hours) at 10/17/2022 1628 Last data filed at 10/17/2022 1600 Gross per 24 hour  Intake 1655.42 ml  Output 3425 ml  Net -1769.58 ml   Filed Weights   10/17/22 1049  Weight: 69 kg    Examination: General: Middle-aged, chronically ill-appearing HENT: Moist oral mucosa Lungs: Clear breath sounds Cardiovascular: S1-S2 appreciated Abdomen: Bowel sounds appreciated Extremities: No clubbing, no edema Neuro: Alert and oriented x 3 GU:   Resolved Hospital  Problem list     Assessment & Plan:  Alcohol withdrawal -Severe alcohol dependence -Started on Librium, on CIWA protocol with Ativan -Patient with past history of Dts -Multivitamin, folic acid, thiamine  -Continue CIWA -Will place on phenobarb taper  -Precedex only if needed  History of seizures -On Keppra  Elevated LFTs due to alcoholism Thrombocytopenia due to alcoholism  HIV -Noncompliant with medications  Best Practice (right click and "Reselect all SmartList Selections" daily)   Per primary  Labs   CBC: Recent Labs  Lab 10/15/22 0808 10/15/22 2028 10/16/22 2036 10/17/22 1209  WBC 2.9* 3.2* 3.3* 2.6*  NEUTROABS  --  1.1* 2.1  --   HGB 13.7 12.9* 12.5* 13.1  HCT 41.1 38.3* 37.1* 38.8*  MCV 102.2* 101.3* 100.8* 100.8*  PLT 112* 123* 108* 106*    Basic Metabolic Panel: Recent Labs  Lab 10/15/22 0808 10/15/22 2028 10/16/22 2036 10/17/22 0809 10/17/22 1209  NA 141 140 136 133* 134*  K 3.7 3.3* 4.5 3.9 3.9  CL 109 108 103 102 101  CO2 23 22 25  17* 25  GLUCOSE 87 142* 114* 65* 108*  BUN 7 5* 6 <5* <5*  CREATININE 0.57* 0.73 0.77 0.42* 0.64  CALCIUM 8.3* 8.4* 10.0 7.9* 9.5  MG  --   --   --   --  1.8  PHOS  --   --   --   --  3.3   GFR: Estimated Creatinine Clearance: 124.6 mL/min (by C-G formula based on SCr of 0.64 mg/dL). Recent Labs  Lab 10/15/22 [phone] 10/15/22 2028 10/16/22 2036 10/17/22 1209  WBC 2.9* 3.2* 3.3* 2.6*    Liver Function Tests: Recent Labs  Lab 10/15/22 0808 10/15/22 2028 10/16/22 2036 10/17/22 0809 10/17/22 1209  AST 165* 130* 140* 56* 87*  ALT 85* 77* 75* 39 63*  ALKPHOS 75 76 70 41 70  BILITOT 0.6 0.5 0.8 0.9 1.2  PROT 7.5 7.6 6.8 4.1* 7.0  ALBUMIN 3.8 3.9 3.3* 1.9* 3.5   Recent Labs  Lab 10/15/22 2028 10/16/22 2036  LIPASE 205* 220*   No results for input(s): "AMMONIA" in the last 168 hours.  ABG    Component Value Date/Time   HCO3 24.8 07/15/2022 0251   TCO2 26 07/15/2022 0251   O2SAT 99  07/15/2022 0251     Coagulation Profile: No results for input(s): "INR", "PROTIME" in the last 168 hours.  Cardiac Enzymes: No results for input(s): "CKTOTAL", "CKMB", "CKMBINDEX", "TROPONINI" in the last 168 hours.  HbA1C: Hgb A1c MFr Bld  Date/Time Value Ref Range Status  07/26/2021 06:53 AM 4.3 (L) 4.8 - 5.6 % Final    Comment:    (NOTE) Pre diabetes:          5.7%-6.4%  Diabetes:              >6.4%  Glycemic control for   <7.0% adults with diabetes   03/12/2021 03:57 AM 5.1 4.8 - 5.6 % Final    Comment:    (NOTE) Pre diabetes:          5.7%-6.4%  Diabetes:              >6.4%  Glycemic control for   <7.0% adults with diabetes     CBG: Recent Labs  Lab 10/17/22 0303  GLUCAP 97    Review of Systems:   Tremulous  Past Medical History:  He,  has a past medical history of Alcohol abuse, Alcohol-induced pancreatitis (04/16/2022), Anxiety, Bipolar 2 disorder (HCC), HIV (human immunodeficiency virus infection) (HCC), Pancreatitis, PTSD (post-traumatic stress disorder), Schizophrenia (HCC), Seizures (HCC), and Subdural hematoma (HCC).   Surgical History:   Past Surgical History:  Procedure Laterality Date   BIOPSY  04/19/2022   Procedure: BIOPSY;  Surgeon: Meridee Score Netty Starring., MD;  Location: [facility] ENDOSCOPY;  Service: Gastroenterology;;   ENTEROSCOPY N/A 04/19/2022   Procedure: ENTEROSCOPY;  Surgeon: Meridee Score Netty Starring., MD;  Location: [facility] ENDOSCOPY;  Service: Gastroenterology;  Laterality: N/A;   INCISION AND DRAINAGE PERIRECTAL ABSCESS N/A 09/24/2016   Procedure: IRRIGATION AND DEBRIDEMENT PERIRECTAL ABSCESS;  Surgeon: Ricarda Frame, MD;  Location: [facility] ORS;  Service: General;  Laterality: N/A;   none       Social History:   reports that he has been smoking cigarettes. He has been smoking an average of .5 packs per day. He has never used smokeless tobacco. He reports current alcohol use of about 105.0 standard drinks of alcohol per week. He reports current  drug use. Drug: Methamphetamines.   Family History:  His family history includes Alcohol abuse in his father and mother; Cancer in an other family member; Colon cancer in an other family member; Other in an other family member.   Allergies Allergies  Allergen Reactions   Tegretol [Carbamazepine] Other (See Comments)    Caused vertigo for 2 days after taking it   Caffeine Palpitations     Home Medications  Prior to Admission medications   Medication Sig Start Date End Date Taking? Authorizing Provider  bictegravir-emtricitabine-tenofovir AF (BIKTARVY) 50-200-25 MG TABS tablet Take 1 tablet by mouth daily. Patient not taking: Reported on  10/16/2022 05/28/22   Briant Cedar, MD  folic acid (FOLVITE) 1 MG tablet Take 1 tablet (1 mg total) by mouth daily. Patient not taking: Reported on 06/18/2022 05/01/22   Kathlen Mody, MD  levETIRAcetam (KEPPRA) 500 MG tablet Take 1 tablet (500 mg total) by mouth 2 (two) times daily. 07/07/22   Gailen Shelter, PA  Multiple Vitamin (MULTIVITAMIN WITH MINERALS) TABS tablet Take 1 tablet by mouth daily. Patient not taking: Reported on 09/11/2022 05/02/22   Kathlen Mody, MD  thiamine 100 MG tablet Take 1 tablet (100 mg total) by mouth daily. Patient not taking: Reported on 08/15/2022 05/01/22   Kathlen Mody, MD  famotidine (PEPCID) 20 MG tablet Take 1 tablet (20 mg total) by mouth 2 (two) times daily. Patient not taking: Reported on 06/01/2019 05/14/19 06/02/19  Benjiman Core, MD    The patient is critically ill with multiple organ systems failure and requires high complexity decision making for assessment and support, frequent evaluation and titration of therapies, application of advanced monitoring technologies and extensive interpretation of multiple databases. Critical Care Time devoted to patient care services described in this note independent of APP/resident time (if applicable)  is 30 minutes.   Virl Diamond MD [facility] Personal pager: See Amion If unanswered, please page CCM On-call: [phone]

## 2022-10-17 NOTE — Progress Notes (Signed)
eLink Physician-Brief Progress Note Patient Name: Gary [name] DOB: 04-29-1986 MRN: [id number]   Date of Service  10/17/2022  HPI/Events of Note  Pt agitated, trying to get up from bed and hit staff.  Attempts to reorient him have been unsuccessful.  He is already on a precedex infusion, and has been receiving PRN ativan per CIWA protocol.   eICU Interventions  4mg  ativan given, which has calmed him down.  Pt placed on soft wrist restraints to avoid harm to self and staff Will increase max dose of precedex to 1.46mcg/kg/hr. Will monitor for development of bradycardia or hypotension.  PRN haldol also ordered for breakthrough agitation. Qtc 433        Delta Pichon M DELA CRUZ 10/17/2022, 11:56 PM

## 2022-10-17 NOTE — Progress Notes (Signed)
PROGRESS NOTE    Gary [name]  [id number] DOB: 1986-11-02 DOA: 10/16/2022 PCP: Patient, No Pcp Per    Brief Narrative:  36 year old with history of alcoholism and delirium tremens, history of seizure, HIV, depression anxiety and pancreatitis admitted with concern for alcohol withdrawal.  He came to emergency room 2 days ago with alcohol intoxication and depression, he told that he wants to drink to die.  He was initially sent to behavioral health, however he started showing symptoms of withdrawals not able to manage at behavior center so sent to the hospital for admission.    Assessment & Plan:   Alcohol abuse with alcohol withdrawal syndrome, delirium tremens: Remains in the stepdown unit.  Currently hemodynamically stable. Librium taper starting with 25 mg 4 times daily.  CIWA protocol with Ativan.  Fall precautions.  Seizure precautions.  Needed Precedex in the previous admissions, will monitor closely.  Symptomatic treatment. Patient on multivitamins and thiamine.  Suicidal ideation: Patient stated drinking alcohol to death.  Suicide precautions.  Bedside sitter.  Will consult psychiatry for reconsideration of admission to inpatient psych.  HIV: On Biktarvy.  Continue.  Unknown control.  Electrolytes: Probably lab error.  Recheck all electrolytes today.   DVT prophylaxis: enoxaparin (LOVENOX) injection 40 mg Start: 10/16/22 2300   Code Status: Full code Family Communication: None at the bedside Disposition Plan: Status is: Inpatient Remains inpatient appropriate because: Severe alcohol withdrawal     Consultants:  None  Procedures:  None  Antimicrobials:  None   Subjective: Patient was seen and examined.  Sitting up in the bed.  Tremors.  He was just given 4 mg of Ativan and he was asking me when is the next dose of Ativan he can have.  He drinks about half a gallon of vodka a day along with some beer and wine as much as he gets.  He is  homeless.  Objective: Vitals:   10/17/22 0930 10/17/22 1000 10/17/22 1049 10/17/22 1100  BP:  118/76  102/71  Pulse: 91 80  90  Resp:      Temp:      TempSrc:      SpO2:  96%  94%  Weight:   69 kg   Height:   6' (1.829 m)     Intake/Output Summary (Last 24 hours) at 10/17/2022 1142 Last data filed at 10/17/2022 1130 Gross per 24 hour  Intake 1475.42 ml  Output 2625 ml  Net -1149.58 ml   Filed Weights   10/17/22 1049  Weight: 69 kg    Examination:  General exam: Appears anxious and tremulous.  Alert and oriented x 4.  No focal neurological deficits.  Asterixis present.  He does have multiple ecchymosis on his hands and legs. Respiratory system: Clear to auscultation. Respiratory effort normal.  No added sounds. Cardiovascular system: S1 & S2 heard, RRR. No pedal edema. Gastrointestinal system: Abdomen is nondistended, soft and nontender. No organomegaly or masses felt. Normal bowel sounds heard. Central nervous system: Alert and oriented. No focal neurological deficits. Extremities: Symmetric 5 x 5 power.  Tremulous.    Data Reviewed: I have personally reviewed following labs and imaging studies  CBC: Recent Labs  Lab 10/15/22 0808 10/15/22 2028 10/16/22 2036  WBC 2.9* 3.2* 3.3*  NEUTROABS  --  1.1* 2.1  HGB 13.7 12.9* 12.5*  HCT 41.1 38.3* 37.1*  MCV 102.2* 101.3* 100.8*  PLT 112* 123* 123XX123*   Basic Metabolic Panel: Recent Labs  Lab 10/15/22 0808 10/15/22 2028 10/16/22 2036  10/17/22 0809  NA 141 140 136 133*  K 3.7 3.3* 4.5 3.9  CL 109 108 103 102  CO2 23 22 25  17*  GLUCOSE 87 142* 114* 65*  BUN 7 5* 6 <5*  CREATININE 0.57* 0.73 0.77 0.42*  CALCIUM 8.3* 8.4* 10.0 7.9*   GFR: Estimated Creatinine Clearance: 124.6 mL/min (A) (by C-G formula based on SCr of 0.42 mg/dL (L)). Liver Function Tests: Recent Labs  Lab 10/15/22 0808 10/15/22 2028 10/16/22 2036 10/17/22 0809  AST 165* 130* 140* 56*  ALT 85* 77* 75* 39  ALKPHOS 75 76 70 41  BILITOT  0.6 0.5 0.8 0.9  PROT 7.5 7.6 6.8 4.1*  ALBUMIN 3.8 3.9 3.3* 1.9*   Recent Labs  Lab 10/15/22 2028 10/16/22 2036  LIPASE 205* 220*   No results for input(s): "AMMONIA" in the last 168 hours. Coagulation Profile: No results for input(s): "INR", "PROTIME" in the last 168 hours. Cardiac Enzymes: No results for input(s): "CKTOTAL", "CKMB", "CKMBINDEX", "TROPONINI" in the last 168 hours. BNP (last 3 results) No results for input(s): "PROBNP" in the last 8760 hours. HbA1C: No results for input(s): "HGBA1C" in the last 72 hours. CBG: Recent Labs  Lab 10/17/22 0303  GLUCAP 97   Lipid Profile: No results for input(s): "CHOL", "HDL", "LDLCALC", "TRIG", "CHOLHDL", "LDLDIRECT" in the last 72 hours. Thyroid Function Tests: No results for input(s): "TSH", "T4TOTAL", "FREET4", "T3FREE", "THYROIDAB" in the last 72 hours. Anemia Panel: No results for input(s): "VITAMINB12", "FOLATE", "FERRITIN", "TIBC", "IRON", "RETICCTPCT" in the last 72 hours. Sepsis Labs: No results for input(s): "PROCALCITON", "LATICACIDVEN" in the last 168 hours.  Recent Results (from the past 240 hour(s))  SARS Coronavirus 2 by RT PCR (hospital order, performed in [facility] hospital lab) *cepheid single result test* Anterior Nasal Swab     Status: None   Collection Time: 10/16/22 12:59 PM   Specimen: Anterior Nasal Swab  Result Value Ref Range Status   SARS Coronavirus 2 by RT PCR NEGATIVE NEGATIVE Final    Comment: (NOTE) SARS-CoV-2 target nucleic acids are NOT DETECTED.  The SARS-CoV-2 RNA is generally detectable in upper and lower respiratory specimens during the acute phase of infection. The lowest concentration of SARS-CoV-2 viral copies this assay can detect is 250 copies / mL. A negative result does not preclude SARS-CoV-2 infection and should not be used as the sole basis for treatment or other patient management decisions.  A negative result may occur with improper specimen collection / handling,  submission of specimen other than nasopharyngeal swab, presence of viral mutation(s) within the areas targeted by this assay, and inadequate number of viral copies (<250 copies / mL). A negative result must be combined with clinical observations, patient history, and epidemiological information.  Fact Sheet for Patients:   14/08/23  Fact Sheet for Healthcare Providers: RoadLapTop.co.za  This test is not yet approved or  cleared by the http://kim-miller.com/ FDA and has been authorized for detection and/or diagnosis of SARS-CoV-2 by FDA under an Emergency Use Authorization (EUA).  This EUA will remain in effect (meaning this test can be used) for the duration of the COVID-19 declaration under Section 564(b)(1) of the Act, 21 U.S.C. section 360bbb-3(b)(1), unless the authorization is terminated or revoked sooner.  Performed at [facility], [address], [place], [place]   MRSA Next Gen by PCR, Nasal     Status: None   Collection Time: 10/17/22  1:38 AM   Specimen: Nasal Mucosa; Nasal Swab  Result Value  Ref Range Status   MRSA by PCR Next Gen NOT DETECTED NOT DETECTED Final    Comment: (NOTE) The GeneXpert MRSA Assay (FDA approved for NASAL specimens only), is one component of a comprehensive MRSA colonization surveillance program. It is not intended to diagnose MRSA infection nor to guide or monitor treatment for MRSA infections. Test performance is not FDA approved in patients less than 62 years old. Performed at [facility], [place] [address] [place], [place] [address]          Radiology Studies: No results found.      Scheduled Meds:  bictegravir-emtricitabine-tenofovir AF  1 tablet Oral QHS   chlordiazePOXIDE  25 mg Oral QID   Followed by   Derrill Memo ON 10/18/2022] chlordiazePOXIDE  25 mg Oral TID   Followed by   Derrill Memo ON 10/19/2022] chlordiazePOXIDE  25 mg Oral  BH-[id number]   Followed by   Derrill Memo ON 10/20/2022] chlordiazePOXIDE  25 mg Oral Daily   Chlorhexidine Gluconate Cloth  6 each Topical Daily   enoxaparin (LOVENOX) injection  40 mg Subcutaneous QHS   folic acid  1 mg Oral Daily   levETIRAcetam  500 mg Oral BID   multivitamin with minerals  1 tablet Oral Daily   sodium chloride flush  3 mL Intravenous Q12H   thiamine  100 mg Oral Daily   Or   thiamine  100 mg Intravenous Daily   Continuous Infusions:   LOS: 1 day    Time spent: 35 minutes    Barb Merino, MD [facility] Pager [phone]

## 2022-10-18 LAB — GLUCOSE, CAPILLARY: Glucose-Capillary: 127 mg/dL — ABNORMAL HIGH (ref 70–99)

## 2022-10-18 LAB — BASIC METABOLIC PANEL
Anion gap: 8 (ref 5–15)
BUN: 8 mg/dL (ref 6–20)
CO2: 25 mmol/L (ref 22–32)
Calcium: 9.2 mg/dL (ref 8.9–10.3)
Chloride: 105 mmol/L (ref 98–111)
Creatinine, Ser: 0.61 mg/dL (ref 0.61–1.24)
GFR, Estimated: >60 mL/min (ref >60)
Glucose, Bld: 117 mg/dL — ABNORMAL HIGH (ref 70–99)
Potassium: 3.7 mmol/L (ref 3.5–5.1)
Sodium: 138 mmol/L (ref 135–145)

## 2022-10-18 LAB — CBC
HCT: 37.1 % — ABNORMAL LOW (ref 39.0–52.0)
Hemoglobin: 12.6 g/dL — ABNORMAL LOW (ref 13.0–17.0)
MCH: 34.1 pg — ABNORMAL HIGH (ref 26.0–34.0)
MCHC: 34 g/dL (ref 30.0–36.0)
MCV: 100.3 fL — ABNORMAL HIGH (ref 80.0–100.0)
Platelets: 91 10*3/uL — ABNORMAL LOW (ref 150–400)
RBC: 3.7 MIL/uL — ABNORMAL LOW (ref 4.22–5.81)
RDW: 13 % (ref 11.5–15.5)
WBC: 2.4 10*3/uL — ABNORMAL LOW (ref 4.0–10.5)
nRBC: 0 % (ref 0.0–0.2)

## 2022-10-18 LAB — MAGNESIUM: Magnesium: 1.9 mg/dL (ref 1.7–2.4)

## 2022-10-18 MED ORDER — DEXMEDETOMIDINE HCL IN NACL 400 MCG/100ML IV SOLN
0.4000 ug/kg/h | INTRAVENOUS | Status: DC
  Administered 2022-10-18: 0.6 ug/kg/h via INTRAVENOUS
  Administered 2022-10-18: 0.5 ug/kg/h via INTRAVENOUS
  Administered 2022-10-19: 0.9 ug/kg/h via INTRAVENOUS
  Administered 2022-10-19 (×2): 0.8 ug/kg/h via INTRAVENOUS
  Administered 2022-10-19: 0.7 ug/kg/h via INTRAVENOUS
  Administered 2022-10-21: 0.4 ug/kg/h via INTRAVENOUS
  Filled 2022-10-18 (×8): qty 100

## 2022-10-18 MED ORDER — LEVETIRACETAM IN NACL 500 MG/100ML IV SOLN
500.0000 mg | Freq: Two times a day (BID) | INTRAVENOUS | Status: DC
  Administered 2022-10-18 – 2022-10-21 (×6): 500 mg via INTRAVENOUS
  Filled 2022-10-18 (×6): qty 100

## 2022-10-18 NOTE — Consult Note (Signed)
Patient seen face to face this morning but he is sedated and too lethargic to participate in a comprehensive psychiatric assessment. Plan: Psychiatric service will follow this patient daily.  Thedore Mins, MD Attending psychiatrist

## 2022-10-18 NOTE — Progress Notes (Signed)
NAME:  Gary [name], MRN:  [id number], DOB:  [birth year], LOS: 2 ADMISSION DATE:  10/16/2022, CONSULTATION DATE: 10/17/2022 REFERRING MD: Dr. Jerral Ralph, CHIEF COMPLAINT: Alcohol withdrawal  History of Present Illness:  36 year old with a history of alcohol dependence, history of withdrawal with DTs in the past History of seizures, depression/anxiety alcoholic pancreatitis Admitted to behavioral health today for alcohol intoxication and depression Was transferred to the ED with concerns for withdrawal Drinks half a gallon of vodka daily plus beer and wine last drink 10/15/2022   He has tremors, diaphoretic, palpitations, nausea/vomiting, has had hallucinations  Pertinent  Medical History   Past Medical History:  Diagnosis Date   Alcohol abuse    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   Anxiety    Bipolar 2 disorder (HCC)    HIV (human immunodeficiency virus infection) (HCC)    Pancreatitis    PTSD (post-traumatic stress disorder)    Schizophrenia (HCC)    Seizures (HCC)    Subdural hematoma (HCC)    Significant Hospital Events: Including procedures, antibiotic start and stop dates in addition to other pertinent events   PCCM asked to see for management of withdrawal  Interim History / Subjective:  Had to be placed on Precedex overnight  Objective   Blood pressure 128/89, pulse 61, temperature 98 F (36.7 C), temperature source Oral, resp. rate 17, height 6' (1.829 m), weight 69 kg, SpO2 93 %.        Intake/Output Summary (Last 24 hours) at 10/18/2022 0809 Last data filed at 10/18/2022 6295 Gross per 24 hour  Intake 3983.55 ml  Output 2825 ml  Net 1158.55 ml   Filed Weights   10/17/22 1049  Weight: 69 kg    Examination: General: Sedated, chronically ill-appearing HENT: Dry oral mucosa Lungs: Clear breath sounds Cardiovascular: S1-S2 appreciated Abdomen: Soft, bowel sounds appreciated Extremities: No clubbing, no edema Neuro: Sedate GU:   Resolved Hospital  Problem list     Assessment & Plan:  Alcohol withdrawal -Severe alcohol dependence -Started on Librium, on CIWA protocol with Ativan -Past history of DTs -Multivitamin, folic acid, thiamine  Delirium tremens -On Precedex  History of seizures -On Keppra  Elevated LFTs due to alcoholism Transaminitis  Thrombocytopenia due to alcoholism  HIV -No table to be noncompliant with medications at baseline -Resumed Biktarvy  Phenobarb not started secondary to interaction with Biktarvy  Best Practice (right click and "Reselect all SmartList Selections" daily)   Diet/type: Regular consistency (see orders) DVT prophylaxis: LMWH GI prophylaxis: PPI Lines: N/A Foley:  N/A Code Status:  full code Last date of multidisciplinary goals of care discussion [pending]  Labs   CBC: Recent Labs  Lab 10/15/22 0808 10/15/22 2028 10/16/22 2036 10/17/22 1209 10/18/22 0239  WBC 2.9* 3.2* 3.3* 2.6* 2.4*  NEUTROABS  --  1.1* 2.1  --   --   HGB 13.7 12.9* 12.5* 13.1 12.6*  HCT 41.1 38.3* 37.1* 38.8* 37.1*  MCV 102.2* 101.3* 100.8* 100.8* 100.3*  PLT 112* 123* 108* 106* 91*    Basic Metabolic Panel: Recent Labs  Lab 10/15/22 2028 10/16/22 2036 10/17/22 0809 10/17/22 1209 10/18/22 0239  NA 140 136 133* 134* 138  K 3.3* 4.5 3.9 3.9 3.7  CL 108 103 102 101 105  CO2 22 25 17* 25 25  GLUCOSE 142* 114* 65* 108* 117*  BUN 5* 6 <5* <5* 8  CREATININE 0.73 0.77 0.42* 0.64 0.61  CALCIUM 8.4* 10.0 7.9* 9.5 9.2  MG  --   --   --  1.8 1.9  PHOS  --   --   --  3.3  --    GFR: Estimated Creatinine Clearance: 124.6 mL/min (by C-G formula based on SCr of 0.61 mg/dL). Recent Labs  Lab 10/15/22 2028 10/16/22 2036 10/17/22 1209 10/18/22 0239  WBC 3.2* 3.3* 2.6* 2.4*    Liver Function Tests: Recent Labs  Lab 10/15/22 0808 10/15/22 2028 10/16/22 2036 10/17/22 0809 10/17/22 1209  AST 165* 130* 140* 56* 87*  ALT 85* 77* 75* 39 63*  ALKPHOS 75 76 70 41 70  BILITOT 0.6 0.5 0.8 0.9  1.2  PROT 7.5 7.6 6.8 4.1* 7.0  ALBUMIN 3.8 3.9 3.3* 1.9* 3.5   Recent Labs  Lab 10/15/22 2028 10/16/22 2036  LIPASE 205* 220*   No results for input(s): "AMMONIA" in the last 168 hours.  ABG    Component Value Date/Time   HCO3 24.8 07/15/2022 0251   TCO2 26 07/15/2022 0251   O2SAT 99 07/15/2022 0251     Coagulation Profile: No results for input(s): "INR", "PROTIME" in the last 168 hours.  Cardiac Enzymes: No results for input(s): "CKTOTAL", "CKMB", "CKMBINDEX", "TROPONINI" in the last 168 hours.  HbA1C: Hgb A1c MFr Bld  Date/Time Value Ref Range Status  07/26/2021 06:53 AM 4.3 (L) 4.8 - 5.6 % Final    Comment:    (NOTE) Pre diabetes:          5.7%-6.4%  Diabetes:              >6.4%  Glycemic control for   <7.0% adults with diabetes   03/12/2021 03:57 AM 5.1 4.8 - 5.6 % Final    Comment:    (NOTE) Pre diabetes:          5.7%-6.4%  Diabetes:              >6.4%  Glycemic control for   <7.0% adults with diabetes     CBG: Recent Labs  Lab 10/17/22 0303 10/18/22 0243  GLUCAP 97 127*    Review of Systems:   Unobtainable  Past Medical History:  He,  has a past medical history of Alcohol abuse, Alcohol-induced pancreatitis (04/16/2022), Anxiety, Bipolar 2 disorder (HCC), HIV (human immunodeficiency virus infection) (HCC), Pancreatitis, PTSD (post-traumatic stress disorder), Schizophrenia (HCC), Seizures (HCC), and Subdural hematoma (HCC).   Surgical History:   Past Surgical History:  Procedure Laterality Date   BIOPSY  04/19/2022   Procedure: BIOPSY;  Surgeon: Meridee Score Netty Starring., MD;  Location: [facility] ENDOSCOPY;  Service: Gastroenterology;;   ENTEROSCOPY N/A 04/19/2022   Procedure: ENTEROSCOPY;  Surgeon: Meridee Score Netty Starring., MD;  Location: [facility] ENDOSCOPY;  Service: Gastroenterology;  Laterality: N/A;   INCISION AND DRAINAGE PERIRECTAL ABSCESS N/A 09/24/2016   Procedure: IRRIGATION AND DEBRIDEMENT PERIRECTAL ABSCESS;  Surgeon: Ricarda Frame, MD;   Location: [facility] ORS;  Service: General;  Laterality: N/A;   none       Social History:   reports that he has been smoking cigarettes. He has been smoking an average of .5 packs per day. He has never used smokeless tobacco. He reports current alcohol use of about 105.0 standard drinks of alcohol per week. He reports current drug use. Drug: Methamphetamines.   Family History:  His family history includes Alcohol abuse in his father and mother; Cancer in an other family member; Colon cancer in an other family member; Other in an other family member.   Allergies Allergies  Allergen Reactions   Tegretol [Carbamazepine] Other (See Comments)    Caused vertigo for  2 days after taking it   Caffeine Palpitations     Home Medications  Prior to Admission medications   Medication Sig Start Date End Date Taking? Authorizing Provider  bictegravir-emtricitabine-tenofovir AF (BIKTARVY) 50-200-25 MG TABS tablet Take 1 tablet by mouth daily. Patient not taking: Reported on 10/16/2022 05/28/22   Briant Cedar, MD  folic acid (FOLVITE) 1 MG tablet Take 1 tablet (1 mg total) by mouth daily. Patient not taking: Reported on 06/18/2022 05/01/22   Kathlen Mody, MD  levETIRAcetam (KEPPRA) 500 MG tablet Take 1 tablet (500 mg total) by mouth 2 (two) times daily. 07/07/22   Gailen Shelter, PA  Multiple Vitamin (MULTIVITAMIN WITH MINERALS) TABS tablet Take 1 tablet by mouth daily. Patient not taking: Reported on 09/11/2022 05/02/22   Kathlen Mody, MD  thiamine 100 MG tablet Take 1 tablet (100 mg total) by mouth daily. Patient not taking: Reported on 08/15/2022 05/01/22   Kathlen Mody, MD  famotidine (PEPCID) 20 MG tablet Take 1 tablet (20 mg total) by mouth 2 (two) times daily. Patient not taking: Reported on 06/01/2019 05/14/19 06/02/19  Benjiman Core, MD    The patient is critically ill with multiple organ systems failure and requires high complexity decision making for assessment and support, frequent  evaluation and titration of therapies, application of advanced monitoring technologies and extensive interpretation of multiple databases. Critical Care Time devoted to patient care services described in this note independent of APP/resident time (if applicable)  is 32 minutes.   Virl Diamond MD [facility] Personal pager: See Amion If unanswered, please page CCM On-call: #[phone]

## 2022-10-19 DIAGNOSIS — F10931 Alcohol use, unspecified with withdrawal delirium: Secondary | ICD-10-CM

## 2022-10-19 DIAGNOSIS — F1020 Alcohol dependence, uncomplicated: Secondary | ICD-10-CM | POA: Diagnosis not present

## 2022-10-19 LAB — CBC WITH DIFFERENTIAL/PLATELET
Abs Immature Granulocytes: 0.01 10*3/uL (ref 0.00–0.07)
Basophils Absolute: 0 10*3/uL (ref 0.0–0.1)
Basophils Relative: 1 %
Eosinophils Absolute: 0.1 10*3/uL (ref 0.0–0.5)
Eosinophils Relative: 3 %
HCT: 38.7 % — ABNORMAL LOW (ref 39.0–52.0)
Hemoglobin: 12.9 g/dL — ABNORMAL LOW (ref 13.0–17.0)
Immature Granulocytes: 0 %
Lymphocytes Relative: 17 %
Lymphs Abs: 0.7 10*3/uL (ref 0.7–4.0)
MCH: 33.8 pg (ref 26.0–34.0)
MCHC: 33.3 g/dL (ref 30.0–36.0)
MCV: 101.3 fL — ABNORMAL HIGH (ref 80.0–100.0)
Monocytes Absolute: 0.3 10*3/uL (ref 0.1–1.0)
Monocytes Relative: 6 %
Neutro Abs: 3.1 10*3/uL (ref 1.7–7.7)
Neutrophils Relative %: 73 %
Platelets: 102 10*3/uL — ABNORMAL LOW (ref 150–400)
RBC: 3.82 MIL/uL — ABNORMAL LOW (ref 4.22–5.81)
RDW: 12.9 % (ref 11.5–15.5)
WBC: 4.3 10*3/uL (ref 4.0–10.5)
nRBC: 0 % (ref 0.0–0.2)

## 2022-10-19 LAB — MAGNESIUM: Magnesium: 1.8 mg/dL (ref 1.7–2.4)

## 2022-10-19 LAB — BASIC METABOLIC PANEL
Anion gap: 9 (ref 5–15)
BUN: 5 mg/dL — ABNORMAL LOW (ref 6–20)
CO2: 21 mmol/L — ABNORMAL LOW (ref 22–32)
Calcium: 9 mg/dL (ref 8.9–10.3)
Chloride: 104 mmol/L (ref 98–111)
Creatinine, Ser: 0.52 mg/dL — ABNORMAL LOW (ref 0.61–1.24)
GFR, Estimated: >60 mL/min (ref >60)
Glucose, Bld: 107 mg/dL — ABNORMAL HIGH (ref 70–99)
Potassium: 3.7 mmol/L (ref 3.5–5.1)
Sodium: 134 mmol/L — ABNORMAL LOW (ref 135–145)

## 2022-10-19 MED ORDER — MAGNESIUM SULFATE 2 GM/50ML IV SOLN
2.0000 g | Freq: Once | INTRAVENOUS | Status: AC
  Administered 2022-10-19: 2 g via INTRAVENOUS
  Filled 2022-10-19: qty 50

## 2022-10-19 NOTE — Consult Note (Cosign Needed Addendum)
[facility] Face-to-Face Psychiatry Consult   Reason for Consult: alcohol detox; si with plan to drinking himself to death Referring Physician: Wynona Neat Patient Identification: [name] MRN:  [id number] Principal Diagnosis: Alcohol dependence with withdrawal [facility]) Diagnosis:  Principal Problem:   Alcohol dependence with withdrawal (HCC) Active Problems:   HIV disease (HCC)   Thrombocytopenia (HCC)   Substance induced mood disorder (HCC)   History of seizures   Elevated LFTs   Delirium tremens (HCC)   Total Time spent with patient: 45 minutes  Subjective:   Gary [name] is a 36 y.o. male patient admitted with alcohol intoxication and depression.  Patient was initially admitted to [facility], for the above however due to history and concerns for possible withdrawal he was transferred to the emergency department.  Patient is well-known to our behavioral health service line, currently has 22 admissions in the last 6 months ED/IP for alcohol induced withdrawal, alcohol intoxication, alcohol abuse, and seizures.  Last drink 3 days ago prior to arrival.  Patient reports drinking half gallon of vodka daily, for the past 5 to 6 years.  Unable to recall his last failed rehabilitation program, but reports it was a while ago.  Patient presented to ED for medical clearance for alcohol detoxification, [facility] hospitalist/CCM was consulted for ICU management of alcohol use disorder.  Patient reports his daily intake liquor to be 1/2 gallon a day.  Patient reports history of alcohol related complications and withdrawal symptoms to include delirium tremens, seizures, alcohol-induced hallucinosis.  No updated CIWA score available.  Patient is currently on Ativan and Librium detox, Precedex drip.  His current symptoms are consistent with his current presentation of alcohol use disorder severe, alcohol withdrawal.  The patient is showing signs of alcohol withdrawal, and remains at high  risk for seizure being day 2 of hospital admission.  Patient reports having auditory hallucinations that started yesterday " conversations of people talking, no one is present" although he does not appear to be acutely psychotic on exam.  Patient reports tremors, diaphoresis, palpitations, nausea and vomiting related to alcohol withdrawal.  Currently on interview, the patient is alert and oriented x 3.  He is lying in bed, with bilateral wrist restraints, and nodding of his head.  He does show difficulty remaining awake, however was able to get patient to awake and engage with provider.  He is also noted to make eye contact with writer, after much encouragement to do so.  Patient reports coming to the hospital to get alcohol detox, and would like to go to a 7-day program once medically stable.  Patient reports current medication regimen and is not enough for him, portion of interview focused on medication management.  He does endorse ongoing alcohol withdrawal symptoms, in which medications he is receiving were reviewed based on our alcohol withdrawal protocol.  Further discussion was held surrounding reason for not administering phenobarbital on this visit, due to compliance with Biktarvy.  Patient is congratulated for remaining compliant despite barriers and other social determinants of health that prohibit him from remaining compliant with his medication and outpatient treatment.  He further denies any suicidal thoughts, suicidal ideations, recent suicide attempts, and or urges to self-harm.  At present and does not appear that patient is ready to sustain from alcohol use at this time, and his main concern is alcohol withdrawal treatment and discharge.  Provider did attempt to engage patient in appropriate transition of care to inpatient residential, outpatient psychiatric services, and psychoeducation.  We also discussed at length his repeat hospitalizations and emergency room visits for alcohol withdrawal.   Patient denies any interest in outpatient psychiatry " unless they are going to help me get disability.  I do not need them."  He further denies any interest in long-term inpatient rehabilitation stating " I do not need a 21-day or 28-day program, I have got better things to do.  As long as I complete my detox here, [facility]."  The patient does not seem to be aware of appropriate psychiatric management and services, in addition to extensive community resources and support.   HPI: Gary [name] is a 36 y.o. male patient admitted with a history of HIV, depression, alcohol abuse, and seizures. He presents with alcohol intoxication and depression.  Patient is homeless and has been drinking alcohol chronically.  Patient was admitted about a month ago for alcohol pancreatitis.  Patient continues to drink about 1.5 gallons of vodka a day.   Past Psychiatric History: Alcohol use disorder, PTSD, anxiety.  Previous psychiatric medication trials include Haldol, Librium, Ativan, phenobarbital.  He reports history of previous suicide attempts, by drinking himself to death at 1 point in time.  He denies any current and or previous outpatient psychiatric provider.  He denies any history of psychiatric hospitalizations, however multiple inpatient rehabilitation admissions for alcohol use disorder.  He denies any recent of substance use.  Smokes about 4 to 5 cigarettes/day.  Risk to Self:  Denies Risk to Others:  Denies Prior Inpatient Therapy:  Denies, multiple inpatient rehabilitation admissions. Prior Outpatient Therapy:  Denies  Past Medical History:  Past Medical History:  Diagnosis Date   Alcohol abuse    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   Anxiety    Bipolar 2 disorder (HCC)    HIV (human immunodeficiency virus infection) (HCC)    Pancreatitis    PTSD (post-traumatic stress disorder)    Schizophrenia (HCC)    Seizures (HCC)    Subdural hematoma (HCC)     Past  Surgical History:  Procedure Laterality Date   BIOPSY  04/19/2022   Procedure: BIOPSY;  Surgeon: Lemar Lofty., MD;  Location: [facility] ENDOSCOPY;  Service: Gastroenterology;;   ENTEROSCOPY N/A 04/19/2022   Procedure: ENTEROSCOPY;  Surgeon: Lemar Lofty., MD;  Location: [facility] ENDOSCOPY;  Service: Gastroenterology;  Laterality: N/A;   INCISION AND DRAINAGE PERIRECTAL ABSCESS N/A 09/24/2016   Procedure: IRRIGATION AND DEBRIDEMENT PERIRECTAL ABSCESS;  Surgeon: Ricarda Frame, MD;  Location: [facility] ORS;  Service: General;  Laterality: N/A;   none     Family History:  Family History  Problem Relation Age of Onset   Alcohol abuse Mother    Alcohol abuse Father    Colon cancer Other    Other Other    Cancer Other    Family Psychiatric  History: Alcohol use in mother and father. Social History:  Social History   Substance and Sexual Activity  Alcohol Use Yes   Alcohol/week: 105.0 standard drinks of alcohol   Types: 105 Cans of beer per week     Social History   Substance and Sexual Activity  Drug Use Yes   Types: Methamphetamines   Comment: last used 04/10/2019    Social History   Socioeconomic History   Marital status: Single    Spouse name: Not on file   Number of children: Not on file   Years of education: Not on file   Highest education level: Not on file  Occupational  History   Occupation: unemployed  Tobacco Use   Smoking status: Every Day    Packs/day: 0.50    Types: Cigarettes   Smokeless tobacco: Never   Tobacco comments:    unable to smoke while incarcerated 6+ months 02/13/20  Vaping Use   Vaping Use: Never used  Substance and Sexual Activity   Alcohol use: Yes    Alcohol/week: 105.0 standard drinks of alcohol    Types: 105 Cans of beer per week   Drug use: Yes    Types: Methamphetamines    Comment: last used 04/10/2019   Sexual activity: Yes    Partners: Male, Male    Comment: declined condoms  03/2021  Other Topics Concern   Not on file   Social History Narrative   "Currently living on the streets"   Independent at baseline   [facility] goes to the [facility]   Social Determinants of Health   Financial Resource Strain: Not on file  Food Insecurity: Food Insecurity Present (10/17/2022)   Hunger Vital Sign    Worried About Running Out of Food in the Last Year: Sometimes true    Ran Out of Food in the Last Year: Sometimes true  Transportation Needs: Unmet Transportation Needs (10/17/2022)   PRAPARE - Administrator, Civil Service (Medical): Yes    Lack of Transportation (Non-Medical): Yes  Physical Activity: Not on file  Stress: Not on file  Social Connections: Not on file   Additional Social History: He is homeless, unemployed seeking disability..    Allergies:   Allergies  Allergen Reactions   Tegretol [Carbamazepine] Other (See Comments)    Caused vertigo for 2 days after taking it   Caffeine Palpitations    Labs:  Results for orders placed or performed during the hospital encounter of 10/16/22 (from the past 48 hour(s))  Basic metabolic panel     Status: Abnormal   Collection Time: 10/18/22  2:39 AM  Result Value Ref Range   Sodium 138 135 - 145 mmol/L   Potassium 3.7 3.5 - 5.1 mmol/L   Chloride 105 98 - 111 mmol/L   CO2 25 22 - 32 mmol/L   Glucose, Bld 117 (H) 70 - 99 mg/dL    Comment: Glucose reference range applies only to samples taken after fasting for at least 8 hours.   BUN 8 6 - 20 mg/dL   Creatinine, Ser 1.69 0.61 - 1.24 mg/dL   Calcium 9.2 8.9 - 67.8 mg/dL   GFR, Estimated >93 >81 mL/min    Comment: (NOTE) Calculated using the CKD-EPI Creatinine Equation (2021)    Anion gap 8 5 - 15    Comment: Performed at [facility], [address] [place], [place] [address]  CBC     Status: Abnormal   Collection Time: 10/18/22  2:39 AM  Result Value Ref Range   WBC 2.4 (L) 4.0 - 10.5 K/uL   RBC 3.70 (L) 4.22 - 5.81 MIL/uL   Hemoglobin 12.6 (L) 13.0 - 17.0 g/dL   HCT  02.5 (L) 85.2 - 52.0 %   MCV 100.3 (H) 80.0 - 100.0 fL   MCH 34.1 (H) 26.0 - 34.0 pg   MCHC 34.0 30.0 - 36.0 g/dL   RDW 77.8 24.2 - 35.3 %   Platelets 91 (L) 150 - 400 K/uL    Comment: Immature Platelet Fraction may be clinically indicated, consider ordering this additional test [id number] CONSISTENT WITH PREVIOUS RESULT REPEATED TO VERIFY    nRBC 0.0 0.0 -  0.2 %    Comment: Performed at [facility], [address] [place], [place] [address]  Magnesium     Status: None   Collection Time: 10/18/22  2:39 AM  Result Value Ref Range   Magnesium 1.9 1.7 - 2.4 mg/dL    Comment: Performed at [facility], [address] [place], [place] [address]  Glucose, capillary     Status: Abnormal   Collection Time: 10/18/22  2:43 AM  Result Value Ref Range   Glucose-Capillary 127 (H) 70 - 99 mg/dL    Comment: Glucose reference range applies only to samples taken after fasting for at least 8 hours.  Basic metabolic panel     Status: Abnormal   Collection Time: 10/19/22  2:48 AM  Result Value Ref Range   Sodium 134 (L) 135 - 145 mmol/L   Potassium 3.7 3.5 - 5.1 mmol/L   Chloride 104 98 - 111 mmol/L   CO2 21 (L) 22 - 32 mmol/L   Glucose, Bld 107 (H) 70 - 99 mg/dL    Comment: Glucose reference range applies only to samples taken after fasting for at least 8 hours.   BUN 5 (L) 6 - 20 mg/dL   Creatinine, Ser 0.980.52 (L) 0.61 - 1.24 mg/dL   Calcium 9.0 8.9 - 11.910.3 mg/dL   GFR, Estimated >14>60 >78>60 mL/min    Comment: (NOTE) Calculated using the CKD-EPI Creatinine Equation (2021)    Anion gap 9 5 - 15    Comment: Performed at [facility], [address] [place], [place] [address]  CBC with Differential/Platelet     Status: Abnormal   Collection Time: 10/19/22  2:48 AM  Result Value Ref Range   WBC 4.3 4.0 - 10.5 K/uL   RBC 3.82 (L) 4.22 - 5.81 MIL/uL   Hemoglobin 12.9 (L) 13.0 - 17.0 g/dL   HCT 13.038.7 (L) 86.539.0 - 78.452.0 %   MCV 101.3 (H) 80.0 -  100.0 fL   MCH 33.8 26.0 - 34.0 pg   MCHC 33.3 30.0 - 36.0 g/dL   RDW 69.612.9 29.511.5 - 28.415.5 %   Platelets 102 (L) 150 - 400 K/uL    Comment: Immature Platelet Fraction may be clinically indicated, consider ordering this additional test [id number]    nRBC 0.0 0.0 - 0.2 %   Neutrophils Relative % 73 %   Neutro Abs 3.1 1.7 - 7.7 K/uL   Lymphocytes Relative 17 %   Lymphs Abs 0.7 0.7 - 4.0 K/uL   Monocytes Relative 6 %   Monocytes Absolute 0.3 0.1 - 1.0 K/uL   Eosinophils Relative 3 %   Eosinophils Absolute 0.1 0.0 - 0.5 K/uL   Basophils Relative 1 %   Basophils Absolute 0.0 0.0 - 0.1 K/uL   Immature Granulocytes 0 %   Abs Immature Granulocytes 0.01 0.00 - 0.07 K/uL    Comment: Performed at [facility], [address] [place], [place] [address]  Magnesium     Status: None   Collection Time: 10/19/22  2:48 AM  Result Value Ref Range   Magnesium 1.8 1.7 - 2.4 mg/dL    Comment: Performed at [facility], [address] [place], [place] [address]    Current Facility-Administered Medications  Medication Dose Route Frequency Provider Last Rate Last Admin   acetaminophen (TYLENOL) tablet 650 mg  650 mg Oral Q6H PRN Charlsie QuestPatel, Vishal R, MD       Or   acetaminophen (TYLENOL) suppository  650 mg  650 mg Rectal Q6H PRN Charlsie Quest, MD       bictegravir-emtricitabine-tenofovir AF (BIKTARVY) 50-200-25 MG per tablet 1 tablet  1 tablet Oral QHS Darreld Mclean R, MD   1 tablet at 10/18/22 2237   chlordiazePOXIDE (LIBRIUM) capsule 25 mg  25 mg Oral Q6H PRN Charlsie Quest, MD       chlordiazePOXIDE (LIBRIUM) capsule 25 mg  25 mg Oral TID Charlsie Quest, MD   25 mg at 10/19/22 9702   Followed by   chlordiazePOXIDE (LIBRIUM) capsule 25 mg  25 mg Oral BH-qamhs Patel, Eston Esters R, MD       Followed by   Melene Muller ON 10/20/2022] chlordiazePOXIDE (LIBRIUM) capsule 25 mg  25 mg Oral Daily Charlsie Quest, MD       Chlorhexidine Gluconate Cloth 2 % PADS 6 each  6 each Topical  Daily Darreld Mclean R, MD   6 each at 10/18/22 1000   dexmedetomidine (PRECEDEX) 400 MCG/100ML (4 mcg/mL) infusion  0.4-1.2 mcg/kg/hr Intravenous Continuous Dorcas Carrow, MD 15.53 mL/hr at 10/19/22 1200 0.9 mcg/kg/hr at 10/19/22 1200   enoxaparin (LOVENOX) injection 40 mg  40 mg Subcutaneous QHS Darreld Mclean R, MD   40 mg at 10/18/22 2237   folic acid (FOLVITE) tablet 1 mg  1 mg Oral Daily Darreld Mclean R, MD   1 mg at 10/19/22 6378   haloperidol lactate (HALDOL) injection 5 mg  5 mg Intravenous Q6H PRN Gaetana Michaelis, MD       lactated ringers infusion   Intravenous Continuous Dorcas Carrow, MD   Stopped at 10/18/22 1738   levETIRAcetam (KEPPRA) IVPB 500 mg/100 mL premix  500 mg Intravenous Q12H Olalere, Adewale A, MD   Stopped at 10/19/22 0947   loperamide (IMODIUM) capsule 2-4 mg  2-4 mg Oral PRN Charlsie Quest, MD       LORazepam (ATIVAN) tablet 1-4 mg  1-4 mg Oral Q1H PRN Charlsie Quest, MD   2 mg at 10/17/22 1803   Or   LORazepam (ATIVAN) injection 1-4 mg  1-4 mg Intravenous Q1H PRN Charlsie Quest, MD   2 mg at 10/19/22 0441   multivitamin with minerals tablet 1 tablet  1 tablet Oral Daily Charlsie Quest, MD   1 tablet at 10/19/22 0921   ondansetron (ZOFRAN) injection 4 mg  4 mg Intravenous Q6H PRN Charlsie Quest, MD   4 mg at 10/19/22 1221   Oral care mouth rinse  15 mL Mouth Rinse PRN Darreld Mclean R, MD       sodium chloride flush (NS) 0.9 % injection 3 mL  3 mL Intravenous Q12H Darreld Mclean R, MD   3 mL at 10/19/22 5885   thiamine (VITAMIN B1) tablet 100 mg  100 mg Oral Daily Darreld Mclean R, MD   100 mg at 10/19/22 0277   Or   thiamine (VITAMIN B1) injection 100 mg  100 mg Intravenous Daily Charlsie Quest, MD   100 mg at 10/18/22 1058    Musculoskeletal: Strength & Muscle Tone: within normal limits Gait & Station: normal Patient leans: N/A            Psychiatric Specialty Exam:  Presentation  General Appearance: Disheveled  Eye  Contact:Fleeting  Speech:Slow; Garbled  Speech Volume:Decreased  Handedness:Right   Mood and Affect  Mood:Anxious; Irritable  Affect:Depressed   Thought Process  Thought Processes:Linear; Coherent  Descriptions of Associations:Intact  Orientation:Full (Time, Place and Person)  Thought Content:Logical  History of Schizophrenia/Schizoaffective disorder:Yes  Duration of Psychotic Symptoms:No data recorded  Hallucinations:Hallucinations: Auditory Description of Auditory Hallucinations: people talking having conversations  Ideas of Reference:None  Suicidal Thoughts:Suicidal Thoughts: No  Homicidal Thoughts:Homicidal Thoughts: No   Sensorium  Memory:Immediate Good; Recent Fair; Remote Fair  Judgment:Impaired  Insight:Shallow   Executive Functions  Concentration:Fair  Attention Span:Fair  Recall:Fair  Fund of Knowledge:Fair  Language:Fair   Psychomotor Activity  Psychomotor Activity:Psychomotor Activity: Tremor; Restlessness   Assets  Assets:Communication Skills; Desire for Improvement   Sleep  Sleep:Sleep: Fair   Physical Exam: Physical Exam Vitals and nursing note reviewed.  Constitutional:      Appearance: Normal appearance. He is ill-appearing.  HENT:     Head: Normocephalic and atraumatic.     Mouth/Throat:     Pharynx: Oropharynx is clear.  Eyes:     Pupils: Pupils are equal, round, and reactive to light.  Musculoskeletal:        General: Normal range of motion.  Skin:    General: Skin is warm and dry.  Neurological:     General: No focal deficit present.     Mental Status: He is alert. Mental status is at baseline.  Psychiatric:        Attention and Perception: Attention normal.        Mood and Affect: Mood is anxious.        Speech: Speech normal.        Behavior: Behavior is cooperative.        Thought Content: Thought content normal.        Cognition and Memory: Cognition normal.        Judgment: Judgment normal.     Review of Systems  Constitutional:  Positive for malaise/fatigue and weight loss.  HENT: Negative.    Eyes: Negative.   Respiratory: Negative.    Cardiovascular: Negative.   Gastrointestinal:  Positive for heartburn, nausea and vomiting.  Musculoskeletal:  Positive for myalgias.  Skin: Negative.   Neurological:  Positive for tremors, seizures and headaches.  Psychiatric/Behavioral:  Positive for depression and substance abuse. Negative for suicidal ideas.    Blood pressure 115/74, pulse 76, temperature 98.2 F (36.8 C), temperature source Oral, resp. rate 18, height 6' (1.829 m), weight 69 kg, SpO2 95 %. Body mass index is 20.63 kg/m.  Patient's presentation is most consistent with severe alcohol use disorder, exacerbation of chronic illness.  Patient's homelessness and impaired access to primary CARE/outpatient psychiatry increases the complexity of managing his current presentation with alcohol intoxication and request for detox.  Based off patient's current presentation, he continues to deny outpatient psychiatric services, although he requests medical detox and [facility].  He denies any acute danger to himself and or others, despite multiple risk factors due to his chronic alcohol use.  On today's evaluation patient is currently lucid, alert and oriented, calm and cooperative no current signs of delirium. As of this note patient has only received 25 mg of Librium and 2 mg of Ativan.  Historically patient has required larger quantities of both medications in the past do not suspect he is out of his detoxification window at this time.   Treatment Plan Summary: Treat alcohol use disorder with Ativan and Librium, and CIWA detox.   -Currently does not meet criteria for commitment.  As he denies being a danger to himself and or others.  Chart review shows patient has historically made suicidal threats" to drink himself  to death ' when  under the influence, however no true attempts have been made.  -TOC consult for inpatient rehabilitation/substance abuse resources. -Recommend continuing safety sitter as patient is high risk for developing delirium tremens and will need ongoing monitoring. -Labs reviewed and assessed, remains with elevated liver enzymes due to her chronic alcohol use.  Blood alcohol level 448 on admission.  Will order B12, B1, and folate.  Psychiatry service will continue to follow from a distance Disposition: Does not meet criteria for inpatient psychiatric admission.    Maryagnes Amos, FNP 10/19/2022 12:46 PM

## 2022-10-19 NOTE — Progress Notes (Signed)
NAME:  Gary [name], MRN:  [id number], DOB:  08-04-1986, LOS: 3 ADMISSION DATE:  10/16/2022, CONSULTATION DATE: 10/17/2022 REFERRING MD: Dr. Jerral Ralph, CHIEF COMPLAINT: Alcohol withdrawal  History of Present Illness:  36 year old with a history of alcohol dependence, history of withdrawal with DTs in the past History of seizures, depression/anxiety alcoholic pancreatitis Admitted to behavioral health today for alcohol intoxication and depression Was transferred to the ED with concerns for withdrawal Drinks half a gallon of vodka daily plus beer and wine last drink 10/15/2022   He has tremors, diaphoretic, palpitations, nausea/vomiting, has had hallucinations  Pertinent  Medical History   Past Medical History:  Diagnosis Date   Alcohol abuse    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   Anxiety    Bipolar 2 disorder (HCC)    HIV (human immunodeficiency virus infection) (HCC)    Pancreatitis    PTSD (post-traumatic stress disorder)    Schizophrenia (HCC)    Seizures (HCC)    Subdural hematoma (HCC)    Significant Hospital Events: Including procedures, antibiotic start and stop dates in addition to other pertinent events   PCCM asked to see for management of withdrawal 12/11 remains on precedex, agitation controlled  Interim History / Subjective:  On precedex, no distress  Objective   Blood pressure (!) 147/101, pulse (!) 48, temperature 99 F (37.2 C), temperature source Axillary, resp. rate 17, height 6' (1.829 m), weight 69 kg, SpO2 95 %.        Intake/Output Summary (Last 24 hours) at 10/19/2022 0835 Last data filed at 10/19/2022 0749 Gross per 24 hour  Intake 1565.38 ml  Output 3400 ml  Net -1834.62 ml    Filed Weights   10/17/22 1049  Weight: 69 kg    General:  thin, chronically ill-appearing M on precedex HEENT: MM pink/dry, sclera injected  Neuro: awake, fatigued, following commands, oriented to person only CV: s1s2 rrr, no m/r/g PULM:  clear bilaterally  without rhonchi or wheezing  GI: soft, non-distended  Extremities: warm/dry, no edema  Skin: no rashes or lesions   Resolved Hospital Problem list     Assessment & Plan:    Alcohol withdrawal -Severe alcohol dependence -Started on Librium, on CIWA protocol with Ativan -Past history of DTs -Multivitamin, folic acid, thiamine -Mag 2g today  Delirium tremens -On Precedex 0.57mcg, titrate down as able -continue scheduled librium and prn ativan  History of seizures -On Keppra -cont seizure precautions  Elevated LFTs due to alcoholism Transaminitis -monitor intermittently   Thrombocytopenia due to alcoholism Stable with platelets 102  HIV -No table to be noncompliant with medications at baseline -Resumed Biktarvy -Phenobarb not started secondary to interaction with Biktarvy  Best Practice (right click and "Reselect all SmartList Selections" daily)   Diet/type: Regular consistency (see orders) DVT prophylaxis: LMWH GI prophylaxis: PPI Lines: N/A Foley:  N/A Code Status:  full code Last date of multidisciplinary goals of care discussion [pending]  Labs   CBC: Recent Labs  Lab 10/15/22 2028 10/16/22 2036 10/17/22 1209 10/18/22 0239 10/19/22 0248  WBC 3.2* 3.3* 2.6* 2.4* 4.3  NEUTROABS 1.1* 2.1  --   --  3.1  HGB 12.9* 12.5* 13.1 12.6* 12.9*  HCT 38.3* 37.1* 38.8* 37.1* 38.7*  MCV 101.3* 100.8* 100.8* 100.3* 101.3*  PLT 123* 108* 106* 91* 102*     Basic Metabolic Panel: Recent Labs  Lab 10/16/22 2036 10/17/22 0809 10/17/22 1209 10/18/22 0239 10/19/22 0248  NA 136 133* 134* 138 134*  K 4.5 3.9 3.9 3.7 3.7  CL 103 102 101 105 104  CO2 25 17* 25 25 21*  GLUCOSE 114* 65* 108* 117* 107*  BUN 6 <5* <5* 8 5*  CREATININE 0.77 0.42* 0.64 0.61 0.52*  CALCIUM 10.0 7.9* 9.5 9.2 9.0  MG  --   --  1.8 1.9 1.8  PHOS  --   --  3.3  --   --     GFR: Estimated Creatinine Clearance: 124.6 mL/min (A) (by C-G formula based on SCr of 0.52 mg/dL (L)). Recent  Labs  Lab 10/16/22 2036 10/17/22 1209 10/18/22 0239 10/19/22 0248  WBC 3.3* 2.6* 2.4* 4.3     Liver Function Tests: Recent Labs  Lab 10/15/22 0808 10/15/22 2028 10/16/22 2036 10/17/22 0809 10/17/22 1209  AST 165* 130* 140* 56* 87*  ALT 85* 77* 75* 39 63*  ALKPHOS 75 76 70 41 70  BILITOT 0.6 0.5 0.8 0.9 1.2  PROT 7.5 7.6 6.8 4.1* 7.0  ALBUMIN 3.8 3.9 3.3* 1.9* 3.5    Recent Labs  Lab 10/15/22 2028 10/16/22 2036  LIPASE 205* 220*    No results for input(s): "AMMONIA" in the last 168 hours.  ABG    Component Value Date/Time   HCO3 24.8 07/15/2022 0251   TCO2 26 07/15/2022 0251   O2SAT 99 07/15/2022 0251     Coagulation Profile: No results for input(s): "INR", "PROTIME" in the last 168 hours.  Cardiac Enzymes: No results for input(s): "CKTOTAL", "CKMB", "CKMBINDEX", "TROPONINI" in the last 168 hours.  HbA1C: Hgb A1c MFr Bld  Date/Time Value Ref Range Status  07/26/2021 06:53 AM 4.3 (L) 4.8 - 5.6 % Final    Comment:    (NOTE) Pre diabetes:          5.7%-6.4%  Diabetes:              >6.4%  Glycemic control for   <7.0% adults with diabetes   03/12/2021 03:57 AM 5.1 4.8 - 5.6 % Final    Comment:    (NOTE) Pre diabetes:          5.7%-6.4%  Diabetes:              >6.4%  Glycemic control for   <7.0% adults with diabetes     CBG: Recent Labs  Lab 10/17/22 0303 10/18/22 0243  GLUCAP 97 127*     Review of Systems:   Unobtainable  Past Medical History:  He,  has a past medical history of Alcohol abuse, Alcohol-induced pancreatitis (04/16/2022), Anxiety, Bipolar 2 disorder (HCC), HIV (human immunodeficiency virus infection) (HCC), Pancreatitis, PTSD (post-traumatic stress disorder), Schizophrenia (HCC), Seizures (HCC), and Subdural hematoma (HCC).   Surgical History:   Past Surgical History:  Procedure Laterality Date   BIOPSY  04/19/2022   Procedure: BIOPSY;  Surgeon: Meridee Score Netty Starring., MD;  Location: [facility] ENDOSCOPY;  Service:  Gastroenterology;;   ENTEROSCOPY N/A 04/19/2022   Procedure: ENTEROSCOPY;  Surgeon: Meridee Score Netty Starring., MD;  Location: [facility] ENDOSCOPY;  Service: Gastroenterology;  Laterality: N/A;   INCISION AND DRAINAGE PERIRECTAL ABSCESS N/A 09/24/2016   Procedure: IRRIGATION AND DEBRIDEMENT PERIRECTAL ABSCESS;  Surgeon: Ricarda Frame, MD;  Location: [facility] ORS;  Service: General;  Laterality: N/A;   none       Social History:   reports that he has been smoking cigarettes. He has been smoking an average of .5 packs per day. He has never used smokeless tobacco. He reports current alcohol use of about 105.0 standard drinks of alcohol per week. He reports current drug use.  Drug: Methamphetamines.   Family History:  His family history includes Alcohol abuse in his father and mother; Cancer in an other family member; Colon cancer in an other family member; Other in an other family member.   Allergies Allergies  Allergen Reactions   Tegretol [Carbamazepine] Other (See Comments)    Caused vertigo for 2 days after taking it   Caffeine Palpitations     Home Medications  Prior to Admission medications   Medication Sig Start Date End Date Taking? Authorizing Provider  bictegravir-emtricitabine-tenofovir AF (BIKTARVY) 50-200-25 MG TABS tablet Take 1 tablet by mouth daily. Patient not taking: Reported on 10/16/2022 05/28/22   Briant Cedar, MD  folic acid (FOLVITE) 1 MG tablet Take 1 tablet (1 mg total) by mouth daily. Patient not taking: Reported on 06/18/2022 05/01/22   Kathlen Mody, MD  levETIRAcetam (KEPPRA) 500 MG tablet Take 1 tablet (500 mg total) by mouth 2 (two) times daily. 07/07/22   Gailen Shelter, PA  Multiple Vitamin (MULTIVITAMIN WITH MINERALS) TABS tablet Take 1 tablet by mouth daily. Patient not taking: Reported on 09/11/2022 05/02/22   Kathlen Mody, MD  thiamine 100 MG tablet Take 1 tablet (100 mg total) by mouth daily. Patient not taking: Reported on 08/15/2022 05/01/22   Kathlen Mody,  MD  famotidine (PEPCID) 20 MG tablet Take 1 tablet (20 mg total) by mouth 2 (two) times daily. Patient not taking: Reported on 06/01/2019 05/14/19 06/02/19  Benjiman Core, MD    CRITICAL CARE Performed by: Darcella Gasman Josealfredo Adkins   Total critical care time: 35 minutes  Critical care time was exclusive of separately billable procedures and treating other patients.  Critical care was necessary to treat or prevent imminent or life-threatening deterioration.  Critical care was time spent personally by me on the following activities: development of treatment plan with patient and/or surrogate as well as nursing, discussions with consultants, evaluation of patient's response to treatment, examination of patient, obtaining history from patient or surrogate, ordering and performing treatments and interventions, ordering and review of laboratory studies, ordering and review of radiographic studies, pulse oximetry and re-evaluation of patient's condition.  Darcella Gasman Jaclynne Baldo, PA-C [facility] See Amion for pager If no response to pager , please call [phone] until 7pm After 7:00 pm call Elink  [phone]

## 2022-10-20 DIAGNOSIS — F1020 Alcohol dependence, uncomplicated: Secondary | ICD-10-CM | POA: Diagnosis not present

## 2022-10-20 LAB — CBC
HCT: 35.7 % — ABNORMAL LOW (ref 39.0–52.0)
Hemoglobin: 12.1 g/dL — ABNORMAL LOW (ref 13.0–17.0)
MCH: 34.2 pg — ABNORMAL HIGH (ref 26.0–34.0)
MCHC: 33.9 g/dL (ref 30.0–36.0)
MCV: 100.8 fL — ABNORMAL HIGH (ref 80.0–100.0)
Platelets: 126 10*3/uL — ABNORMAL LOW (ref 150–400)
RBC: 3.54 MIL/uL — ABNORMAL LOW (ref 4.22–5.81)
RDW: 12.9 % (ref 11.5–15.5)
WBC: 3.8 10*3/uL — ABNORMAL LOW (ref 4.0–10.5)
nRBC: 0 % (ref 0.0–0.2)

## 2022-10-20 LAB — BASIC METABOLIC PANEL
Anion gap: 7 (ref 5–15)
BUN: 5 mg/dL — ABNORMAL LOW (ref 6–20)
CO2: 23 mmol/L (ref 22–32)
Calcium: 8.6 mg/dL — ABNORMAL LOW (ref 8.9–10.3)
Chloride: 106 mmol/L (ref 98–111)
Creatinine, Ser: 0.43 mg/dL — ABNORMAL LOW (ref 0.61–1.24)
GFR, Estimated: >60 mL/min (ref >60)
Glucose, Bld: 101 mg/dL — ABNORMAL HIGH (ref 70–99)
Potassium: 3.8 mmol/L (ref 3.5–5.1)
Sodium: 136 mmol/L (ref 135–145)

## 2022-10-20 LAB — MAGNESIUM: Magnesium: 1.8 mg/dL (ref 1.7–2.4)

## 2022-10-20 MED ORDER — LORAZEPAM 1 MG PO TABS: 1.0000 mg | ORAL_TABLET | Freq: Every day | ORAL | Status: DC

## 2022-10-20 MED ORDER — SENNOSIDES-DOCUSATE SODIUM 8.6-50 MG PO TABS
2.0000 | ORAL_TABLET | Freq: Two times a day (BID) | ORAL | Status: DC
  Administered 2022-10-20 – 2022-10-23 (×5): 2 via ORAL
  Filled 2022-10-20 (×5): qty 2

## 2022-10-20 MED ORDER — LORAZEPAM 1 MG PO TABS
1.0000 mg | ORAL_TABLET | Freq: Three times a day (TID) | ORAL | Status: AC
  Administered 2022-10-21 – 2022-10-22 (×3): 1 mg via ORAL
  Filled 2022-10-20 (×3): qty 1

## 2022-10-20 MED ORDER — LORAZEPAM 2 MG/ML IJ SOLN: 1.0000 mg | INTRAMUSCULAR | Status: DC | PRN

## 2022-10-20 MED ORDER — LORAZEPAM 1 MG PO TABS
1.0000 mg | ORAL_TABLET | Freq: Four times a day (QID) | ORAL | Status: AC
  Administered 2022-10-20 – 2022-10-21 (×6): 1 mg via ORAL
  Filled 2022-10-20 (×6): qty 1

## 2022-10-20 MED ORDER — LORAZEPAM 1 MG PO TABS
1.0000 mg | ORAL_TABLET | Freq: Two times a day (BID) | ORAL | Status: AC
  Administered 2022-10-22 – 2022-10-23 (×2): 1 mg via ORAL
  Filled 2022-10-20 (×2): qty 1

## 2022-10-20 MED ORDER — LORAZEPAM 1 MG PO TABS
1.0000 mg | ORAL_TABLET | Freq: Four times a day (QID) | ORAL | Status: DC | PRN
  Administered 2022-10-21: 1 mg via ORAL
  Filled 2022-10-20: qty 1

## 2022-10-20 MED ORDER — BISACODYL 10 MG RE SUPP: 10.0000 mg | Freq: Every day | RECTAL | Status: DC | PRN

## 2022-10-20 NOTE — Progress Notes (Signed)
eLink Physician-Brief Progress Note Patient Name: Gary [name] DOB: 1985-12-02 MRN: [id number]   Date of Service  10/20/2022  HPI/Events of Note  Constipation - Patient request for PRN for constipation.   eICU Interventions  Plan: Dulcolax Suppository 10 mg Q day PRN moderate constipation     Intervention Category Major Interventions: Other:  Lenell Antu 10/20/2022, 7:54 PM

## 2022-10-20 NOTE — Progress Notes (Signed)
Pt  c/o lower abd pain, feels bloated; E-link notified/ MD aware.

## 2022-10-20 NOTE — Consult Note (Signed)
[facility] Face-to-Face Psychiatry Consult   Reason for Consult: alcohol detox; si with plan to drinking himself to death Referring Physician: Wynona Neat Patient Identification: Gary [name] MRN:  [id number] Principal Diagnosis: Alcohol dependence with withdrawal [facility]) Diagnosis:  Principal Problem:   Alcohol dependence with withdrawal (HCC) Active Problems:   HIV disease (HCC)   Thrombocytopenia (HCC)   Substance induced mood disorder (HCC)   History of seizures   Elevated LFTs   Delirium tremens (HCC)   Total Time spent with patient: 45 minutes  Subjective:   Gary [name] is a 36 y.o. male patient admitted with alcohol intoxication and depression.  Patient was initially admitted to [facility], for the above however due to history and concerns for possible withdrawal he was transferred to the emergency department.  Patient is well-known to our behavioral health service line, currently has 22 admissions in the last 6 months ED/IP for alcohol induced withdrawal, alcohol intoxication, alcohol abuse, and seizures.   Patient seen and assessed by this psychiatric nurse practitioner.  Patient's psychiatric condition has improved and is beginning to stabilize.  He shows minimal signs of ongoing alcohol withdrawal as evident by reduce psychosis, confusion, reduced tremors of hands, no obvious sweating, fever, or abnormal vital signs.  Patient remains interested in completing his treatment for alcohol detox, and has been hospitalized for approximately 4 days.  He reports spending 24 hours in the emergency room, prior to admission to intensive care.  At this time inpatient psychiatric hospitalization would no longer be beneficial, as patient is almost completed detox, and is no longer a danger to self or others.  He has continued to deny all suicidal ideations, suicidal intent.  Patient does appear to be future oriented to pursue outpatient psychiatric treatment for alcohol use  disorder.  He did speak specifically to [facility]rolling [facility], substance abuse resources have been provided.  He reports after completing of [facility] he will turn his self and to [facility] where he has outstanding warrants for failure to appear for larceny.  Furthermore he reports being interested in drug and alcohol court, through [facility] in [place].  Patient's current level of psychiatric illness no longer requires inpatient hospitalization.    After thorough evaluation and review of information currently present on examination, there are insufficient findings to indicate patient meets criteria for ongoing IVC or requires an inpatient level of care. Patient is linear, direct, and organized; denies HI and is not currently significantly impaired, psychotic, or manic on exam.  He is observed to be listening to metallic music and dancing.  Detailed risk assessment is complete based on clinical exam and individual risk factors and acute suicide risk is low and acute violence risk is low. Individualized risk factors include: high risk diagnoses and individualized protective factors include: moving patient to higher level of care, feels supported, no access to firearms, optimism that change can occur, future oriented and participating in treatment plan. At this time protective factors outweigh risk factors.  Patient remains within window to develop delirium tremens and or acute alcoholic hallucinosis, although this is unlikely considering this is day 4 since his last drink and he appears to be responding well at this time.     HPI: Gary [name] is a 36 y.o. male patient admitted with a history of HIV, depression, alcohol abuse, and seizures. He presents with alcohol intoxication and depression.  Patient is homeless and has been drinking alcohol chronically.  Patient was admitted about a month ago for alcohol pancreatitis.  Patient continues to drink about 1.5 gallons of  vodka a day.   Past Psychiatric History: Alcohol use disorder, PTSD, anxiety.  Previous psychiatric medication trials include Haldol, Librium, Ativan, phenobarbital.  He reports history of previous suicide attempts, by drinking himself to death at 1 point in time.  He denies any current and or previous outpatient psychiatric provider.  He denies any history of psychiatric hospitalizations, however multiple inpatient rehabilitation admissions for alcohol use disorder.  He denies any recent of substance use.  Smokes about 4 to 5 cigarettes/day.  Risk to Self:  Denies Risk to Others:  Denies Prior Inpatient Therapy:  Denies, multiple inpatient rehabilitation admissions. Prior Outpatient Therapy:  Denies  Past Medical History:  Past Medical History:  Diagnosis Date   Alcohol abuse    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   Anxiety    Bipolar 2 disorder (HCC)    HIV (human immunodeficiency virus infection) (HCC)    Pancreatitis    PTSD (post-traumatic stress disorder)    Schizophrenia (HCC)    Seizures (HCC)    Subdural hematoma (HCC)     Past Surgical History:  Procedure Laterality Date   BIOPSY  04/19/2022   Procedure: BIOPSY;  Surgeon: Lemar Lofty., MD;  Location: [facility] ENDOSCOPY;  Service: Gastroenterology;;   ENTEROSCOPY N/A 04/19/2022   Procedure: ENTEROSCOPY;  Surgeon: Lemar Lofty., MD;  Location: [facility] ENDOSCOPY;  Service: Gastroenterology;  Laterality: N/A;   INCISION AND DRAINAGE PERIRECTAL ABSCESS N/A 09/24/2016   Procedure: IRRIGATION AND DEBRIDEMENT PERIRECTAL ABSCESS;  Surgeon: Ricarda Frame, MD;  Location: [facility] ORS;  Service: General;  Laterality: N/A;   none     Family History:  Family History  Problem Relation Age of Onset   Alcohol abuse Mother    Alcohol abuse Father    Colon cancer Other    Other Other    Cancer Other    Family Psychiatric  History: Alcohol use in mother and father. Social History:  Social History   Substance and Sexual  Activity  Alcohol Use Yes   Alcohol/week: 105.0 standard drinks of alcohol   Types: 105 Cans of beer per week     Social History   Substance and Sexual Activity  Drug Use Yes   Types: Methamphetamines   Comment: last used 04/10/2019    Social History   Socioeconomic History   Marital status: Single    Spouse name: Not on file   Number of children: Not on file   Years of education: Not on file   Highest education level: Not on file  Occupational History   Occupation: unemployed  Tobacco Use   Smoking status: Every Day    Packs/day: 0.50    Types: Cigarettes   Smokeless tobacco: Never   Tobacco comments:    unable to smoke while incarcerated 6+ months 02/13/20  Vaping Use   Vaping Use: Never used  Substance and Sexual Activity   Alcohol use: Yes    Alcohol/week: 105.0 standard drinks of alcohol    Types: 105 Cans of beer per week   Drug use: Yes    Types: Methamphetamines    Comment: last used 04/10/2019   Sexual activity: Yes    Partners: Male, Male    Comment: declined condoms  03/2021  Other Topics Concern   Not on file  Social History Narrative   "Currently living on the streets"   Independent at baseline   Occasionally goes to the  [facility]   Social Determinants of Health   Financial Resource Strain: Not on file  Food Insecurity: Food Insecurity Present (10/17/2022)   Hunger Vital Sign    Worried About Running Out of Food in the Last Year: Sometimes true    Ran Out of Food in the Last Year: Sometimes true  Transportation Needs: Unmet Transportation Needs (10/17/2022)   PRAPARE - Administrator, Civil ServiceTransportation    Lack of Transportation (Medical): Yes    Lack of Transportation (Non-Medical): Yes  Physical Activity: Not on file  Stress: Not on file  Social Connections: Not on file   Additional Social History: He is homeless, unemployed seeking disability..    Allergies:   Allergies  Allergen Reactions   Tegretol [Carbamazepine] Other (See Comments)    Caused vertigo for 2  days after taking it   Caffeine Palpitations    Labs:  Results for orders placed or performed during the hospital encounter of 10/16/22 (from the past 48 hour(s))  Basic metabolic panel     Status: Abnormal   Collection Time: 10/19/22  2:48 AM  Result Value Ref Range   Sodium 134 (L) 135 - 145 mmol/L   Potassium 3.7 3.5 - 5.1 mmol/L   Chloride 104 98 - 111 mmol/L   CO2 21 (L) 22 - 32 mmol/L   Glucose, Bld 107 (H) 70 - 99 mg/dL    Comment: Glucose reference range applies only to samples taken after fasting for at least 8 hours.   BUN 5 (L) 6 - 20 mg/dL   Creatinine, Ser 1.610.52 (L) 0.61 - 1.24 mg/dL   Calcium 9.0 8.9 - 09.610.3 mg/dL   GFR, Estimated >04>60 >54>60 mL/min    Comment: (NOTE) Calculated using the CKD-EPI Creatinine Equation (2021)    Anion gap 9 5 - 15    Comment: Performed at [facility], [address] [place], [place] [address]  CBC with Differential/Platelet     Status: Abnormal   Collection Time: 10/19/22  2:48 AM  Result Value Ref Range   WBC 4.3 4.0 - 10.5 K/uL   RBC 3.82 (L) 4.22 - 5.81 MIL/uL   Hemoglobin 12.9 (L) 13.0 - 17.0 g/dL   HCT 91.438.7 (L) 78.239.0 - 95.652.0 %   MCV 101.3 (H) 80.0 - 100.0 fL   MCH 33.8 26.0 - 34.0 pg   MCHC 33.3 30.0 - 36.0 g/dL   RDW 21.312.9 08.611.5 - 57.815.5 %   Platelets 102 (L) 150 - 400 K/uL    Comment: Immature Platelet Fraction may be clinically indicated, consider ordering this additional test [id number]    nRBC 0.0 0.0 - 0.2 %   Neutrophils Relative % 73 %   Neutro Abs 3.1 1.7 - 7.7 K/uL   Lymphocytes Relative 17 %   Lymphs Abs 0.7 0.7 - 4.0 K/uL   Monocytes Relative 6 %   Monocytes Absolute 0.3 0.1 - 1.0 K/uL   Eosinophils Relative 3 %   Eosinophils Absolute 0.1 0.0 - 0.5 K/uL   Basophils Relative 1 %   Basophils Absolute 0.0 0.0 - 0.1 K/uL   Immature Granulocytes 0 %   Abs Immature Granulocytes 0.01 0.00 - 0.07 K/uL    Comment: Performed at [facility], [address] [place], [place] [address]   Magnesium     Status: None   Collection Time: 10/19/22  2:48 AM  Result Value Ref Range   Magnesium 1.8 1.7 - 2.4 mg/dL    Comment: Performed at [facility], [address] [place], [place] [address]  CBC     Status: Abnormal   Collection Time: 10/20/22  8:23 AM  Result Value Ref Range   WBC 3.8 (L) 4.0 - 10.5 K/uL   RBC 3.54 (L) 4.22 - 5.81 MIL/uL   Hemoglobin 12.1 (L) 13.0 - 17.0 g/dL   HCT 47.4 (L) 25.9 - 56.3 %   MCV 100.8 (H) 80.0 - 100.0 fL   MCH 34.2 (H) 26.0 - 34.0 pg   MCHC 33.9 30.0 - 36.0 g/dL   RDW 87.5 64.3 - 32.9 %   Platelets 126 (L) 150 - 400 K/uL   nRBC 0.0 0.0 - 0.2 %    Comment: Performed at [facility], [address], [place], [place] [address]  Basic metabolic panel     Status: Abnormal   Collection Time: 10/20/22  8:23 AM  Result Value Ref Range   Sodium 136 135 - 145 mmol/L   Potassium 3.8 3.5 - 5.1 mmol/L   Chloride 106 98 - 111 mmol/L   CO2 23 22 - 32 mmol/L   Glucose, Bld 101 (H) 70 - 99 mg/dL    Comment: Glucose reference range applies only to samples taken after fasting for at least 8 hours.   BUN 5 (L) 6 - 20 mg/dL   Creatinine, Ser 1.66 (L) 0.61 - 1.24 mg/dL   Calcium 8.6 (L) 8.9 - 10.3 mg/dL   GFR, Estimated >06 >30 mL/min    Comment: (NOTE) Calculated using the CKD-EPI Creatinine Equation (2021)    Anion gap 7 5 - 15    Comment: Performed at [facility], [address] [place], [place] [address]  Magnesium     Status: None   Collection Time: 10/20/22  8:23 AM  Result Value Ref Range   Magnesium 1.8 1.7 - 2.4 mg/dL    Comment: Performed at [facility], [address] [place], [place] [address]    Current Facility-Administered Medications  Medication Dose Route Frequency Provider Last Rate Last Admin   acetaminophen (TYLENOL) tablet 650 mg  650 mg Oral Q6H PRN Charlsie Quest, MD       Or   acetaminophen (TYLENOL) suppository 650 mg  650 mg Rectal Q6H PRN  Charlsie Quest, MD       bictegravir-emtricitabine-tenofovir AF (BIKTARVY) 50-200-25 MG per tablet 1 tablet  1 tablet Oral QHS Charlsie Quest, MD   1 tablet at 10/19/22 2219   chlordiazePOXIDE (LIBRIUM) capsule 25 mg  25 mg Oral Daily Charlsie Quest, MD       Chlorhexidine Gluconate Cloth 2 % PADS 6 each  6 each Topical Daily Darreld Mclean R, MD   6 each at 10/18/22 1000   dexmedetomidine (PRECEDEX) 400 MCG/100ML (4 mcg/mL) infusion  0.4-1.2 mcg/kg/hr Intravenous Continuous Dorcas Carrow, MD 3.45 mL/hr at 10/20/22 1103 0.2 mcg/kg/hr at 10/20/22 1103   enoxaparin (LOVENOX) injection 40 mg  40 mg Subcutaneous QHS Darreld Mclean R, MD   40 mg at 10/19/22 2220   folic acid (FOLVITE) tablet 1 mg  1 mg Oral Daily Darreld Mclean R, MD   1 mg at 10/20/22 1004   haloperidol lactate (HALDOL) injection 5 mg  5 mg Intravenous Q6H PRN Gaetana Michaelis, MD       lactated ringers infusion   Intravenous Continuous Dorcas Carrow, MD 150 mL/hr at 10/20/22 1207 New Bag at 10/20/22 1207   levETIRAcetam (KEPPRA) IVPB 500 mg/100 mL premix  500 mg  Intravenous Q12H Virl Diamond A, MD   Stopped at 10/20/22 1027   LORazepam (ATIVAN) injection 1 mg  1 mg Intravenous Q4H PRN Gleason, Darcella Gasman, PA-C       LORazepam (ATIVAN) tablet 1 mg  1 mg Oral Q6H PRN Starkes-Perry, Juel Burrow, FNP       LORazepam (ATIVAN) tablet 1 mg  1 mg Oral QID Maryagnes Amos, FNP   1 mg at 10/20/22 1431   Followed by   Melene Muller ON 10/21/2022] LORazepam (ATIVAN) tablet 1 mg  1 mg Oral TID Maryagnes Amos, FNP       Followed by   Melene Muller ON 10/22/2022] LORazepam (ATIVAN) tablet 1 mg  1 mg Oral BID Maryagnes Amos, FNP       Followed by   Melene Muller ON 10/24/2022] LORazepam (ATIVAN) tablet 1 mg  1 mg Oral Daily Starkes-Perry, Tacari Repass S, FNP       multivitamin with minerals tablet 1 tablet  1 tablet Oral Daily Darreld Mclean R, MD   1 tablet at 10/20/22 1004   ondansetron (ZOFRAN) injection 4 mg  4 mg Intravenous Q6H PRN Charlsie Quest, MD   4 mg at 10/19/22 1221   Oral care mouth rinse  15 mL Mouth Rinse PRN Darreld Mclean R, MD       sodium chloride flush (NS) 0.9 % injection 3 mL  3 mL Intravenous Q12H Darreld Mclean R, MD   3 mL at 10/20/22 1013   thiamine (VITAMIN B1) tablet 100 mg  100 mg Oral Daily Darreld Mclean R, MD   100 mg at 10/20/22 1004   Or   thiamine (VITAMIN B1) injection 100 mg  100 mg Intravenous Daily Charlsie Quest, MD   100 mg at 10/18/22 1058    Musculoskeletal: Strength & Muscle Tone: within normal limits Gait & Station: normal Patient leans: N/A            Psychiatric Specialty Exam:  Presentation  General Appearance: Appropriate for Environment  Eye Contact:Fair  Speech:Clear and Coherent; Normal Rate  Speech Volume:Normal  Handedness:Right   Mood and Affect  Mood:Euthymic  Affect:Appropriate; Congruent   Thought Process  Thought Processes:Coherent; Linear  Descriptions of Associations:Intact  Orientation:Full (Time, Place and Person)  Thought Content:Logical  History of Schizophrenia/Schizoaffective disorder:Yes  Duration of Psychotic Symptoms:No data recorded  Hallucinations:Hallucinations: None Description of Auditory Hallucinations: people talking having conversations  Ideas of Reference:None  Suicidal Thoughts:Suicidal Thoughts: No  Homicidal Thoughts:Homicidal Thoughts: No   Sensorium  Memory:Immediate Good; Remote Good; Recent Good  Judgment:Fair  Insight:Good   Executive Functions  Concentration:Good  Attention Span:Good  Recall:Good  Fund of Knowledge:Good  Language:Good   Psychomotor Activity  Psychomotor Activity:Psychomotor Activity: Normal   Assets  Assets:Communication Skills; Desire for Improvement; Physical Health; Social Support   Sleep  Sleep:Sleep: Fair   Physical Exam: Physical Exam Vitals and nursing note reviewed.  Constitutional:      Appearance: Normal appearance. He is normal weight. He is not  ill-appearing.  HENT:     Head: Normocephalic and atraumatic.     Mouth/Throat:     Pharynx: Oropharynx is clear.  Eyes:     Pupils: Pupils are equal, round, and reactive to light.  Musculoskeletal:        General: Normal range of motion.  Skin:    General: Skin is warm and dry.  Neurological:     General: No focal deficit present.     Mental Status: He is alert. Mental  status is at baseline.  Psychiatric:        Attention and Perception: Attention normal.        Mood and Affect: Mood is anxious.        Speech: Speech normal.        Behavior: Behavior is cooperative.        Thought Content: Thought content normal.        Cognition and Memory: Cognition normal.        Judgment: Judgment normal.    Review of Systems  Constitutional:  Positive for malaise/fatigue and weight loss.  HENT: Negative.    Eyes: Negative.   Respiratory: Negative.    Cardiovascular: Negative.   Gastrointestinal:  Positive for heartburn, nausea and vomiting.  Musculoskeletal:  Positive for myalgias.  Skin: Negative.   Neurological:  Positive for tremors, seizures and headaches.  Psychiatric/Behavioral:  Positive for depression and substance abuse. Negative for suicidal ideas.    Blood pressure 112/72, pulse 81, temperature 97.7 F (36.5 C), temperature source Oral, resp. rate 20, height 6' (1.829 m), weight 69 kg, SpO2 95 %. Body mass index is 20.63 kg/m.  Patient's presentation is most consistent with severe alcohol use disorder, exacerbation of chronic illness.  Patient's homelessness and impaired access to primary CARE/outpatient psychiatry increases the complexity of managing his current presentation with alcohol intoxication and request for detox.  Based off patient's current presentation, he continues to deny outpatient psychiatric services, although he requests medical detox and assistance with [facility].  He denies any acute danger to himself and or others,  despite multiple risk factors due to his chronic alcohol use.  On today's evaluation patient is currently lucid, alert and oriented, calm and cooperative no current signs of delirium. As of this note patient has only received 25 mg of Librium and 1 mg of Ativan.  .   Treatment Plan Summary: Treat alcohol use disorder with Ativan and Librium, and [facility] detox.   -Currently does not meet criteria for commitment.  As he denies being a danger to himself and or others.   -TOC consult for inpatient rehabilitation/substance abuse resources. -Recommend continuing safety sitter as patient is high risk for developing delirium tremens and will need ongoing monitoring. -Labs reviewed and assessed, remains with elevated liver enzymes due to her chronic alcohol use.  Blood alcohol level 448 on admission.    Psychiatry service will continue to follow from a distance.  Disposition: Does not meet criteria for inpatient psychiatric admission.    Maryagnes Amos, FNP 10/20/2022 4:00 PM

## 2022-10-20 NOTE — Progress Notes (Signed)
Start senna docusate for constipation

## 2022-10-20 NOTE — Progress Notes (Signed)
NAME:  Gary [name], MRN:  [id number], DOB:  12/08/1985, LOS: 4 ADMISSION DATE:  10/16/2022, CONSULTATION DATE: 10/17/2022 REFERRING MD: Dr. Jerral Ralph, CHIEF COMPLAINT: Alcohol withdrawal  History of Present Illness:  36 year old with a history of alcohol dependence, history of withdrawal with DTs in the past History of seizures, depression/anxiety alcoholic pancreatitis Admitted to behavioral health today for alcohol intoxication and depression Was transferred to the ED with concerns for withdrawal Drinks half a gallon of vodka daily plus beer and wine last drink 10/15/2022   He has tremors, diaphoretic, palpitations, nausea/vomiting, has had hallucinations  Pertinent  Medical History   Past Medical History:  Diagnosis Date   Alcohol abuse    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   Anxiety    Bipolar 2 disorder (HCC)    HIV (human immunodeficiency virus infection) (HCC)    Pancreatitis    PTSD (post-traumatic stress disorder)    Schizophrenia (HCC)    Seizures (HCC)    Subdural hematoma (HCC)    Significant Hospital Events: Including procedures, antibiotic start and stop dates in addition to other pertinent events   PCCM asked to see for management of withdrawal 12/11 remains on precedex, agitation controlled 12/12 titrating precedex, on 0.68mcg, asking for benzos, does not meet criteria for inpatient psychiatry placement  Interim History / Subjective:   No overnight events, calm, asking for more benzodiazepines  Objective   Blood pressure 114/82, pulse (!) 58, temperature 97.9 F (36.6 C), temperature source Oral, resp. rate 19, height 6' (1.829 m), weight 69 kg, SpO2 93 %.        Intake/Output Summary (Last 24 hours) at 10/20/2022 0737 Last data filed at 10/20/2022 4098 Gross per 24 hour  Intake 3908.55 ml  Output 3975 ml  Net -66.45 ml    Filed Weights   10/17/22 1049  Weight: 69 kg    General:  thin, chronically ill-appearing M resting in bed in no  distress HEENT: MM pink/dry, sclera injected  Neuro: awake, fatigued, following commands, oriented to person and situation, angry about decreased ativan CV: s1s2 rrr, no m/r/g PULM:  no tachypnea or distress on RA, equal chest rise GI: soft, non-distended  Extremities: warm/dry, no edema  Skin: no rashes or lesions   Resolved Hospital Problem list     Assessment & Plan:    Alcohol withdrawal -Severe alcohol dependence -continue Precedex and taper as able, continue scheduled librium, prn Ativan -Past history of DTs, seen by psychiatry as not deemed to be a current risk to himself or others -continue sitter -B12, B1 and folate levels ordered by psychiatry  -Multivitamin, folic acid, thiamine -labs pending for today  Delirium tremens -On Precedex 0.30mcg, titrate down as able -continue scheduled librium and prn ativan  History of seizures -On Keppra -cont seizure precautions  Elevated LFTs due to alcoholism Transaminitis -monitor intermittently   Thrombocytopenia due to alcoholism Stable without bleeding -monitor  HIV -No table to be noncompliant with medications at baseline -Resumed Biktarvy -Phenobarb not started secondary to interaction with [employer] [facility] (right click and "Reselect all SmartList Selections" daily)   Diet/type: Regular consistency (see orders) DVT prophylaxis: LMWH GI prophylaxis: PPI Lines: N/A Foley:  N/A Code Status:  full code Last date of multidisciplinary goals of care discussion [pending]  Labs   CBC: Recent Labs  Lab 10/15/22 2028 10/16/22 2036 10/17/22 1209 10/18/22 0239 10/19/22 0248  WBC 3.2* 3.3* 2.6* 2.4* 4.3  NEUTROABS 1.1* 2.1  --   --  3.1  HGB 12.9*  12.5* 13.1 12.6* 12.9*  HCT 38.3* 37.1* 38.8* 37.1* 38.7*  MCV 101.3* 100.8* 100.8* 100.3* 101.3*  PLT 123* 108* 106* 91* 102*     Basic Metabolic Panel: Recent Labs  Lab 10/16/22 2036 10/17/22 0809 10/17/22 1209 10/18/22 0239 10/19/22 0248  NA 136  133* 134* 138 134*  K 4.5 3.9 3.9 3.7 3.7  CL 103 102 101 105 104  CO2 25 17* 25 25 21*  GLUCOSE 114* 65* 108* 117* 107*  BUN 6 <5* <5* 8 5*  CREATININE 0.77 0.42* 0.64 0.61 0.52*  CALCIUM 10.0 7.9* 9.5 9.2 9.0  MG  --   --  1.8 1.9 1.8  PHOS  --   --  3.3  --   --     GFR: Estimated Creatinine Clearance: 124.6 mL/min (A) (by C-G formula based on SCr of 0.52 mg/dL (L)). Recent Labs  Lab 10/16/22 2036 10/17/22 1209 10/18/22 0239 10/19/22 0248  WBC 3.3* 2.6* 2.4* 4.3     Liver Function Tests: Recent Labs  Lab 10/15/22 0808 10/15/22 2028 10/16/22 2036 10/17/22 0809 10/17/22 1209  AST 165* 130* 140* 56* 87*  ALT 85* 77* 75* 39 63*  ALKPHOS 75 76 70 41 70  BILITOT 0.6 0.5 0.8 0.9 1.2  PROT 7.5 7.6 6.8 4.1* 7.0  ALBUMIN 3.8 3.9 3.3* 1.9* 3.5    Recent Labs  Lab 10/15/22 2028 10/16/22 2036  LIPASE 205* 220*    No results for input(s): "AMMONIA" in the last 168 hours.  ABG    Component Value Date/Time   HCO3 24.8 07/15/2022 0251   TCO2 26 07/15/2022 0251   O2SAT 99 07/15/2022 0251     Coagulation Profile: No results for input(s): "INR", "PROTIME" in the last 168 hours.  Cardiac Enzymes: No results for input(s): "CKTOTAL", "CKMB", "CKMBINDEX", "TROPONINI" in the last 168 hours.  HbA1C: Hgb A1c MFr Bld  Date/Time Value Ref Range Status  07/26/2021 06:53 AM 4.3 (L) 4.8 - 5.6 % Final    Comment:    (NOTE) Pre diabetes:          5.7%-6.4%  Diabetes:              >6.4%  Glycemic control for   <7.0% adults with diabetes   03/12/2021 03:57 AM 5.1 4.8 - 5.6 % Final    Comment:    (NOTE) Pre diabetes:          5.7%-6.4%  Diabetes:              >6.4%  Glycemic control for   <7.0% adults with diabetes     CBG: Recent Labs  Lab 10/17/22 0303 10/18/22 0243  GLUCAP 97 127*     Review of Systems:   Unobtainable  Past Medical History:  He,  has a past medical history of Alcohol abuse, Alcohol-induced pancreatitis (04/16/2022), Anxiety,  Bipolar 2 disorder (HCC), HIV (human immunodeficiency virus infection) (HCC), Pancreatitis, PTSD (post-traumatic stress disorder), Schizophrenia (HCC), Seizures (HCC), and Subdural hematoma (HCC).   Surgical History:   Past Surgical History:  Procedure Laterality Date   BIOPSY  04/19/2022   Procedure: BIOPSY;  Surgeon: Meridee Score Netty Starring., MD;  Location: [facility] ENDOSCOPY;  Service: Gastroenterology;;   ENTEROSCOPY N/A 04/19/2022   Procedure: ENTEROSCOPY;  Surgeon: Meridee Score Netty Starring., MD;  Location: [facility] ENDOSCOPY;  Service: Gastroenterology;  Laterality: N/A;   INCISION AND DRAINAGE PERIRECTAL ABSCESS N/A 09/24/2016   Procedure: IRRIGATION AND DEBRIDEMENT PERIRECTAL ABSCESS;  Surgeon: Ricarda Frame, MD;  Location: [facility] ORS;  Service: General;  Laterality: N/A;   none       Social History:   reports that he has been smoking cigarettes. He has been smoking an average of .5 packs per day. He has never used smokeless tobacco. He reports current alcohol use of about 105.0 standard drinks of alcohol per week. He reports current drug use. Drug: Methamphetamines.   Family History:  His family history includes Alcohol abuse in his father and mother; Cancer in an other family member; Colon cancer in an other family member; Other in an other family member.   Allergies Allergies  Allergen Reactions   Tegretol [Carbamazepine] Other (See Comments)    Caused vertigo for 2 days after taking it   Caffeine Palpitations     Home Medications  Prior to Admission medications   Medication Sig Start Date End Date Taking? Authorizing Provider  bictegravir-emtricitabine-tenofovir AF (BIKTARVY) 50-200-25 MG TABS tablet Take 1 tablet by mouth daily. Patient not taking: Reported on 10/16/2022 05/28/22   Briant Cedar, MD  folic acid (FOLVITE) 1 MG tablet Take 1 tablet (1 mg total) by mouth daily. Patient not taking: Reported on 06/18/2022 05/01/22   Kathlen Mody, MD  levETIRAcetam (KEPPRA) 500 MG  tablet Take 1 tablet (500 mg total) by mouth 2 (two) times daily. 07/07/22   Gailen Shelter, PA  Multiple Vitamin (MULTIVITAMIN WITH MINERALS) TABS tablet Take 1 tablet by mouth daily. Patient not taking: Reported on 09/11/2022 05/02/22   Kathlen Mody, MD  thiamine 100 MG tablet Take 1 tablet (100 mg total) by mouth daily. Patient not taking: Reported on 08/15/2022 05/01/22   Kathlen Mody, MD  famotidine (PEPCID) 20 MG tablet Take 1 tablet (20 mg total) by mouth 2 (two) times daily. Patient not taking: Reported on 06/01/2019 05/14/19 06/02/19  Benjiman Core, MD    CRITICAL CARE Performed by: Darcella Gasman Brent Taillon   Total critical care time: 32 minutes  Critical care time was exclusive of separately billable procedures and treating other patients.  Critical care was necessary to treat or prevent imminent or life-threatening deterioration.  Critical care was time spent personally by me on the following activities: development of treatment plan with patient and/or surrogate as well as nursing, discussions with consultants, evaluation of patient's response to treatment, examination of patient, obtaining history from patient or surrogate, ordering and performing treatments and interventions, ordering and review of laboratory studies, ordering and review of radiographic studies, pulse oximetry and re-evaluation of patient's condition.  Darcella Gasman Uva Runkel, PA-C [facility] See Amion for pager If no response to pager , please call [phone] until 7pm After 7:00 pm call Elink  [phone]

## 2022-10-21 DIAGNOSIS — F1020 Alcohol dependence, uncomplicated: Secondary | ICD-10-CM

## 2022-10-21 DIAGNOSIS — D696 Thrombocytopenia, unspecified: Secondary | ICD-10-CM

## 2022-10-21 DIAGNOSIS — F10930 Alcohol use, unspecified with withdrawal, uncomplicated: Secondary | ICD-10-CM

## 2022-10-21 DIAGNOSIS — F1010 Alcohol abuse, uncomplicated: Secondary | ICD-10-CM

## 2022-10-21 LAB — BASIC METABOLIC PANEL
Anion gap: 8 (ref 5–15)
BUN: 9 mg/dL (ref 6–20)
CO2: 23 mmol/L (ref 22–32)
Calcium: 9 mg/dL (ref 8.9–10.3)
Chloride: 102 mmol/L (ref 98–111)
Creatinine, Ser: 0.57 mg/dL — ABNORMAL LOW (ref 0.61–1.24)
GFR, Estimated: >60 mL/min (ref >60)
Glucose, Bld: 102 mg/dL — ABNORMAL HIGH (ref 70–99)
Potassium: 3.6 mmol/L (ref 3.5–5.1)
Sodium: 133 mmol/L — ABNORMAL LOW (ref 135–145)

## 2022-10-21 LAB — CBC
HCT: 36.9 % — ABNORMAL LOW (ref 39.0–52.0)
Hemoglobin: 12.1 g/dL — ABNORMAL LOW (ref 13.0–17.0)
MCH: 33.5 pg (ref 26.0–34.0)
MCHC: 32.8 g/dL (ref 30.0–36.0)
MCV: 102.2 fL — ABNORMAL HIGH (ref 80.0–100.0)
Platelets: 141 10*3/uL — ABNORMAL LOW (ref 150–400)
RBC: 3.61 MIL/uL — ABNORMAL LOW (ref 4.22–5.81)
RDW: 13.1 % (ref 11.5–15.5)
WBC: 3.5 10*3/uL — ABNORMAL LOW (ref 4.0–10.5)
nRBC: 0 % (ref 0.0–0.2)

## 2022-10-21 LAB — MAGNESIUM: Magnesium: 1.9 mg/dL (ref 1.7–2.4)

## 2022-10-21 MED ORDER — POTASSIUM CHLORIDE CRYS ER 20 MEQ PO TBCR
40.0000 meq | EXTENDED_RELEASE_TABLET | Freq: Once | ORAL | Status: AC
  Administered 2022-10-21: 40 meq via ORAL
  Filled 2022-10-21: qty 2

## 2022-10-21 MED ORDER — LORAZEPAM 1 MG PO TABS
1.0000 mg | ORAL_TABLET | ORAL | Status: DC | PRN
  Administered 2022-10-22 (×2): 2 mg via ORAL
  Administered 2022-10-23: 1 mg via ORAL
  Filled 2022-10-21: qty 2
  Filled 2022-10-21: qty 1
  Filled 2022-10-21: qty 2

## 2022-10-21 MED ORDER — LORAZEPAM 2 MG/ML IJ SOLN
1.0000 mg | INTRAMUSCULAR | Status: DC | PRN
  Administered 2022-10-21: 3 mg via INTRAVENOUS
  Administered 2022-10-22 – 2022-10-23 (×2): 2 mg via INTRAVENOUS
  Filled 2022-10-21: qty 1
  Filled 2022-10-21: qty 2
  Filled 2022-10-21: qty 1

## 2022-10-21 MED ORDER — SODIUM CHLORIDE 0.9 % IV SOLN: INTRAVENOUS | Status: DC

## 2022-10-21 MED ORDER — LORAZEPAM 2 MG/ML IJ SOLN
1.0000 mg | INTRAMUSCULAR | Status: DC | PRN
  Administered 2022-10-21: 2 mg via INTRAVENOUS
  Filled 2022-10-21: qty 1

## 2022-10-21 MED ORDER — LEVETIRACETAM 500 MG PO TABS
500.0000 mg | ORAL_TABLET | Freq: Two times a day (BID) | ORAL | Status: DC
  Administered 2022-10-21 – 2022-10-23 (×4): 500 mg via ORAL
  Filled 2022-10-21 (×4): qty 1

## 2022-10-21 MED ORDER — LORAZEPAM 1 MG PO TABS
2.0000 mg | ORAL_TABLET | Freq: Four times a day (QID) | ORAL | Status: DC | PRN
  Administered 2022-10-21: 2 mg via ORAL
  Filled 2022-10-21: qty 2

## 2022-10-21 MED ORDER — MAGNESIUM SULFATE IN D5W 1-5 GM/100ML-% IV SOLN
1.0000 g | Freq: Once | INTRAVENOUS | Status: AC
  Administered 2022-10-21: 1 g via INTRAVENOUS
  Filled 2022-10-21: qty 100

## 2022-10-21 MED ORDER — HYDROXYZINE HCL 10 MG PO TABS
10.0000 mg | ORAL_TABLET | Freq: Four times a day (QID) | ORAL | Status: DC | PRN
  Filled 2022-10-21: qty 1

## 2022-10-21 NOTE — Progress Notes (Signed)
NAME:  Gary [name], MRN:  [id number], DOB:  [birth year], LOS: 5 ADMISSION DATE:  10/16/2022, CONSULTATION DATE: 10/17/2022 REFERRING MD: Dr. Jerral Ralph, CHIEF COMPLAINT: Alcohol withdrawal  History of Present Illness:  36 year old with a history of alcohol dependence, history of withdrawal with DTs in the past History of seizures, depression/anxiety alcoholic pancreatitis Admitted to behavioral health today for alcohol intoxication and depression Was transferred to the ED with concerns for withdrawal Drinks half a gallon of vodka daily plus beer and wine last drink 10/15/2022   He has tremors, diaphoretic, palpitations, nausea/vomiting, has had hallucinations  Pertinent  Medical History   Past Medical History:  Diagnosis Date   Alcohol abuse    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   Anxiety    Bipolar 2 disorder (HCC)    HIV (human immunodeficiency virus infection) (HCC)    Pancreatitis    PTSD (post-traumatic stress disorder)    Schizophrenia (HCC)    Seizures (HCC)    Subdural hematoma (HCC)    Significant Hospital Events: Including procedures, antibiotic start and stop dates in addition to other pertinent events   PCCM asked to see for management of withdrawal 12/11 remains on precedex, agitation controlled 12/12 titrating precedex, on 0.31mcg, asking for benzos, does not meet criteria for inpatient psychiatry placement 12/13 dc precedex   Interim History / Subjective:   Dc precedex this morning    Objective   Blood pressure (!) 100/55, pulse (!) 57, temperature 97.8 F (36.6 C), temperature source Oral, resp. rate 14, height 6' (1.829 m), weight 69 kg, SpO2 96 %.        Intake/Output Summary (Last 24 hours) at 10/21/2022 1051 Last data filed at 10/21/2022 1032 Gross per 24 hour  Intake 3696.96 ml  Output 4650 ml  Net -953.04 ml   Filed Weights   10/17/22 1049  Weight: 69 kg    General:  chronically ill adult M NAD  HEENT: NCAT temporal muscle wasting   Neuro: AAOx3 following commands  CV: rr s1s2 no rgm  PULM:  CTAb even unlabored on RA  GI: soft ndnt  Extremities: no acute joint deformity  Skin: c/d/w   Resolved Hospital Problem list     Assessment & Plan:    EtOH use disorder  Etoh withdrawal with Dts  -dc precedex -cont SCH and PRN ativan -- increased the PRNs from 1 to 1-2mg   -continue sitter -cont micronutrient support  -psych has followed, does not meet inpt psych admission criteria   Hx seizures  -Keppra, sz precautions   Elevated LFTs 2/2 etoh abuse -PRN LFTs   Thrombocytopenia due to alcoholism Anemia -- chronic dz, frequent lab draws  -PRN CBC   Hyponatremia  Borderline hypomagnesemia and hypokalemia -receiving Mag, K  -follow BMP  -encourage PO intake   HIV -On Biktarvy -Phenobarb not started secondary to interaction with Biktarvy  Best Practice (right click and "Reselect all SmartList Selections" daily)   Diet/type: Regular consistency (see orders) DVT prophylaxis: LMWH GI prophylaxis: PPI Lines: N/A Foley:  N/A Code Status:  full code Last date of multidisciplinary goals of care discussion [pending]  Labs   CBC: Recent Labs  Lab 10/15/22 2028 10/16/22 2036 10/17/22 1209 10/18/22 0239 10/19/22 0248 10/20/22 0823 10/21/22 0243  WBC 3.2* 3.3* 2.6* 2.4* 4.3 3.8* 3.5*  NEUTROABS 1.1* 2.1  --   --  3.1  --   --   HGB 12.9* 12.5* 13.1 12.6* 12.9* 12.1* 12.1*  HCT 38.3* 37.1* 38.8* 37.1* 38.7* 35.7* 36.9*  MCV  101.3* 100.8* 100.8* 100.3* 101.3* 100.8* 102.2*  PLT 123* 108* 106* 91* 102* 126* 141*    Basic Metabolic Panel: Recent Labs  Lab 10/17/22 1209 10/18/22 0239 10/19/22 0248 10/20/22 0823 10/21/22 0243  NA 134* 138 134* 136 133*  K 3.9 3.7 3.7 3.8 3.6  CL 101 105 104 106 102  CO2 25 25 21* 23 23  GLUCOSE 108* 117* 107* 101* 102*  BUN <5* 8 5* 5* 9  CREATININE 0.64 0.61 0.52* 0.43* 0.57*  CALCIUM 9.5 9.2 9.0 8.6* 9.0  MG 1.8 1.9 1.8 1.8 1.9  PHOS 3.3  --   --   --    --    GFR: Estimated Creatinine Clearance: 124.6 mL/min (A) (by C-G formula based on SCr of 0.57 mg/dL (L)). Recent Labs  Lab 10/18/22 0239 10/19/22 0248 10/20/22 0823 10/21/22 0243  WBC 2.4* 4.3 3.8* 3.5*    Liver Function Tests: Recent Labs  Lab 10/15/22 0808 10/15/22 2028 10/16/22 2036 10/17/22 0809 10/17/22 1209  AST 165* 130* 140* 56* 87*  ALT 85* 77* 75* 39 63*  ALKPHOS 75 76 70 41 70  BILITOT 0.6 0.5 0.8 0.9 1.2  PROT 7.5 7.6 6.8 4.1* 7.0  ALBUMIN 3.8 3.9 3.3* 1.9* 3.5   Recent Labs  Lab 10/15/22 2028 10/16/22 2036  LIPASE 205* 220*   No results for input(s): "AMMONIA" in the last 168 hours.  ABG    Component Value Date/Time   HCO3 24.8 07/15/2022 0251   TCO2 26 07/15/2022 0251   O2SAT 99 07/15/2022 0251     Coagulation Profile: No results for input(s): "INR", "PROTIME" in the last 168 hours.  Cardiac Enzymes: No results for input(s): "CKTOTAL", "CKMB", "CKMBINDEX", "TROPONINI" in the last 168 hours.  HbA1C: Hgb A1c MFr Bld  Date/Time Value Ref Range Status  07/26/2021 06:53 AM 4.3 (L) 4.8 - 5.6 % Final    Comment:    (NOTE) Pre diabetes:          5.7%-6.4%  Diabetes:              >6.4%  Glycemic control for   <7.0% adults with diabetes   03/12/2021 03:57 AM 5.1 4.8 - 5.6 % Final    Comment:    (NOTE) Pre diabetes:          5.7%-6.4%  Diabetes:              >6.4%  Glycemic control for   <7.0% adults with diabetes     CBG: Recent Labs  Lab 10/17/22 0303 10/18/22 0243  GLUCAP 97 127*    CCT n/a  Tessie Fass MSN, AGACNP-BC [facility] Amion for pager  10/21/2022, 10:51 AM

## 2022-10-21 NOTE — Progress Notes (Addendum)
eLink Physician-Brief Progress Note Patient Name: Gary [name] DOB: October 21, 1986 MRN: [id number]   Date of Service  10/21/2022  HPI/Events of Note  Hyponatremia - Na+ = 136 --> 133.  eICU Interventions  Plan: D/C LR IV infusion at 150 mL/hour. 0.9 NaCl IV infusion at 150 mL/hour.      Intervention Category Major Interventions: Electrolyte abnormality - evaluation and management  Emryn Flanery Eugene 10/21/2022, 6:16 AM

## 2022-10-21 NOTE — Progress Notes (Signed)
eLink Physician-Brief Progress Note Patient Name: Gary [name] DOB: August 30, 1986 MRN: [id number]   Date of Service  10/21/2022  HPI/Events of Note  Hypomagnesemia  Hypokalemia - Mg++ = 1.9, K+ = 3.6 and Creatinine = 0.57.  eICU Interventions  PLan: Will replace Mg++ and K+.     Intervention Category Major Interventions: Electrolyte abnormality - evaluation and management  Gary [name] 10/21/2022, 4:30 AM

## 2022-10-21 NOTE — Consult Note (Signed)
[facility] Face-to-Face Psychiatry Consult   Reason for Consult: alcohol detox; si with plan to drinking himself to death Referring Physician: Wynona Neat Patient Identification: Gary [name] MRN:  [id number] Principal Diagnosis: Alcohol dependence with withdrawal [facility]) Diagnosis:  Principal Problem:   Alcohol dependence with withdrawal (HCC) Active Problems:   HIV disease (HCC)   Thrombocytopenia (HCC)   Substance induced mood disorder (HCC)   History of seizures   Elevated LFTs   Delirium tremens (HCC)   Total Time spent with patient: 45 minutes  Subjective:   Gary [name] is a 36 y.o. male patient admitted with alcohol intoxication and depression.  Patient was initially admitted to [facility], for the above however due to history and concerns for possible withdrawal he was transferred to the emergency department.  Patient is well-known to our behavioral health service line, currently has 22 admissions in the last 6 months ED/IP for alcohol induced withdrawal, alcohol intoxication, alcohol abuse, and seizures.   Patient seen and assessed by this psychiatric nurse practitioner.  Gary [name] has a significant substance use and alcohol use history, has been formally diagnosed with  alcohol use disorder, polysubstance use disorder.  He currently denies any recent illicit substance use.  He reports his longest period of sobriety for 2023 is about 3 weeks in between inpatient rehab and emergency room visit.  He is currently hospitalized for alcohol withdrawal and history of severe DTs on day 4/day 5.  Currently on Precedex drip, has received a total of 4 mg of Ativan and 50 mg of Librium in the past 24 hours.  No obvious seizure-like events have been noted thus far.  While he is within the time window to have a seizure in the setting of alcohol withdrawal, I believe that this is less likely given his clinical picture with stable vital signs and lack of withdrawal symptoms  this morning.  Today is day 5, will continue to monitor from a distance as patient remains in window for worsening alcohol withdrawal symptoms.  Patient states his mood is okay, just irritable. "They are giving 1 mg Ativan at a time. It is a joke to me. "  He identifies his mood as irritable, and his affect is congruent.  He is alert and oriented x 4 calm yet Grudgingly cooperative.  He continues to endorse physical symptoms of acute alcohol withdrawal at this time to include abdominal pain, myalgia, chills, headache. He is currently does appear to have some volition to initiate changes in his future, as he shows interest and motivation to quit drinking and seek inpatient rehabilitation services.   Patient denies any history or current symptoms of depression.  He denies a history of anxiety other than when he cannot drink.Patient specifically denies any suicidal ideation, plan, or intent.  He denies ever drinking alcohol as an attempt to end his life.  He denies any homicidal ideation.     HPI: Gary [name] is a 36 y.o. male patient admitted with a history of HIV, depression, alcohol abuse, and seizures. He presents with alcohol intoxication and depression.  Patient is homeless and has been drinking alcohol chronically.  Patient was admitted about a month ago for alcohol pancreatitis.  Patient continues to drink about 1.5 gallons of vodka a day.   Past Psychiatric History: Alcohol use disorder, PTSD, anxiety.  Previous psychiatric medication trials include Haldol, Librium, Ativan, phenobarbital.  He reports history of previous suicide attempts, by drinking himself to death at 1 point in  time.  He denies any current and or previous outpatient psychiatric provider.  He denies any history of psychiatric hospitalizations, however multiple inpatient rehabilitation admissions for alcohol use disorder.  He denies any recent of substance use.  Smokes about 4 to 5 cigarettes/day.  Risk to Self:  Denies Risk to  Others:  Denies Prior Inpatient Therapy:  Denies, multiple inpatient rehabilitation admissions. Prior Outpatient Therapy:  Denies  Past Medical History:  Past Medical History:  Diagnosis Date   Alcohol abuse    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   Anxiety    Bipolar 2 disorder (HCC)    HIV (human immunodeficiency virus infection) (HCC)    Pancreatitis    PTSD (post-traumatic stress disorder)    Schizophrenia (HCC)    Seizures (HCC)    Subdural hematoma (HCC)     Past Surgical History:  Procedure Laterality Date   BIOPSY  04/19/2022   Procedure: BIOPSY;  Surgeon: Lemar Lofty., MD;  Location: [facility] ENDOSCOPY;  Service: Gastroenterology;;   ENTEROSCOPY N/A 04/19/2022   Procedure: ENTEROSCOPY;  Surgeon: Lemar Lofty., MD;  Location: [facility] ENDOSCOPY;  Service: Gastroenterology;  Laterality: N/A;   INCISION AND DRAINAGE PERIRECTAL ABSCESS N/A 09/24/2016   Procedure: IRRIGATION AND DEBRIDEMENT PERIRECTAL ABSCESS;  Surgeon: Ricarda Frame, MD;  Location: [facility] ORS;  Service: General;  Laterality: N/A;   none     Family History:  Family History  Problem Relation Age of Onset   Alcohol abuse Mother    Alcohol abuse Father    Colon cancer Other    Other Other    Cancer Other    Family Psychiatric  History: Alcohol use in mother and father. Social History:  Social History   Substance and Sexual Activity  Alcohol Use Yes   Alcohol/week: 105.0 standard drinks of alcohol   Types: 105 Cans of beer per week     Social History   Substance and Sexual Activity  Drug Use Yes   Types: Methamphetamines   Comment: last used 04/10/2019    Social History   Socioeconomic History   Marital status: Single    Spouse name: Not on file   Number of children: Not on file   Years of education: Not on file   Highest education level: Not on file  Occupational History   Occupation: unemployed  Tobacco Use   Smoking status: Every Day    Packs/day: 0.50    Types: Cigarettes    Smokeless tobacco: Never   Tobacco comments:    unable to smoke while incarcerated 6+ months 02/13/20  Vaping Use   Vaping Use: Never used  Substance and Sexual Activity   Alcohol use: Yes    Alcohol/week: 105.0 standard drinks of alcohol    Types: 105 Cans of beer per week   Drug use: Yes    Types: Methamphetamines    Comment: last used 04/10/2019   Sexual activity: Yes    Partners: Male, Male    Comment: declined condoms  03/2021  Other Topics Concern   Not on file  Social History Narrative   "Currently living on the streets"   Independent at baseline   Occasionally goes to the [facility]   Social Determinants of Health   Financial Resource Strain: Not on file  Food Insecurity: Food Insecurity Present (10/17/2022)   Hunger Vital Sign    Worried About Running Out of Food in the Last Year: Sometimes true    Ran Out of Food in the Last Year: Sometimes true  Transportation Needs: Unmet  Transportation Needs (10/17/2022)   PRAPARE - Administrator, Civil ServiceTransportation    Lack of Transportation (Medical): Yes    Lack of Transportation (Non-Medical): Yes  Physical Activity: Not on file  Stress: Not on file  Social Connections: Not on file   Additional Social History: He is homeless, unemployed seeking disability..    Allergies:   Allergies  Allergen Reactions   Tegretol [Carbamazepine] Other (See Comments)    Caused vertigo for 2 days after taking it   Caffeine Palpitations    Labs:  Results for orders placed or performed during the hospital encounter of 10/16/22 (from the past 48 hour(s))  CBC     Status: Abnormal   Collection Time: 10/20/22  8:23 AM  Result Value Ref Range   WBC 3.8 (L) 4.0 - 10.5 K/uL   RBC 3.54 (L) 4.22 - 5.81 MIL/uL   Hemoglobin 12.1 (L) 13.0 - 17.0 g/dL   HCT 16.135.7 (L) 09.639.0 - 04.552.0 %   MCV 100.8 (H) 80.0 - 100.0 fL   MCH 34.2 (H) 26.0 - 34.0 pg   MCHC 33.9 30.0 - 36.0 g/dL   RDW 40.912.9 81.111.5 - 91.415.5 %   Platelets 126 (L) 150 - 400 K/uL   nRBC 0.0 0.0 - 0.2 %    Comment:  Performed at [facility], [address] [place], [place] [address]  Basic metabolic panel     Status: Abnormal   Collection Time: 10/20/22  8:23 AM  Result Value Ref Range   Sodium 136 135 - 145 mmol/L   Potassium 3.8 3.5 - 5.1 mmol/L   Chloride 106 98 - 111 mmol/L   CO2 23 22 - 32 mmol/L   Glucose, Bld 101 (H) 70 - 99 mg/dL    Comment: Glucose reference range applies only to samples taken after fasting for at least 8 hours.   BUN 5 (L) 6 - 20 mg/dL   Creatinine, Ser 6.210.43 (L) 0.61 - 1.24 mg/dL   Calcium 8.6 (L) 8.9 - 10.3 mg/dL   GFR, Estimated >30>60 >86>60 mL/min    Comment: (NOTE) Calculated using the CKD-EPI Creatinine Equation (2021)    Anion gap 7 5 - 15    Comment: Performed at [facility], [address] [place], [place] [address]  Magnesium     Status: None   Collection Time: 10/20/22  8:23 AM  Result Value Ref Range   Magnesium 1.8 1.7 - 2.4 mg/dL    Comment: Performed at [facility], [address] [place], [place] [address]  Basic metabolic panel     Status: Abnormal   Collection Time: 10/21/22  2:43 AM  Result Value Ref Range   Sodium 133 (L) 135 - 145 mmol/L   Potassium 3.6 3.5 - 5.1 mmol/L   Chloride 102 98 - 111 mmol/L   CO2 23 22 - 32 mmol/L   Glucose, Bld 102 (H) 70 - 99 mg/dL    Comment: Glucose reference range applies only to samples taken after fasting for at least 8 hours.   BUN 9 6 - 20 mg/dL   Creatinine, Ser 2.840.57 (L) 0.61 - 1.24 mg/dL   Calcium 9.0 8.9 - 13.210.3 mg/dL   GFR, Estimated >44>60 >01>60 mL/min    Comment: (NOTE) Calculated using the CKD-EPI Creatinine Equation (2021)    Anion gap 8 5 - 15    Comment: Performed at [facility], [address] [place], [place] [address]  Magnesium     Status: None  Collection Time: 10/21/22  2:43 AM  Result Value Ref Range   Magnesium 1.9 1.7 - 2.4 mg/dL    Comment: Performed at [facility], [address],  [place], [place] [address]  CBC     Status: Abnormal   Collection Time: 10/21/22  2:43 AM  Result Value Ref Range   WBC 3.5 (L) 4.0 - 10.5 K/uL   RBC 3.61 (L) 4.22 - 5.81 MIL/uL   Hemoglobin 12.1 (L) 13.0 - 17.0 g/dL   HCT 78.2 (L) 95.6 - 21.3 %   MCV 102.2 (H) 80.0 - 100.0 fL   MCH 33.5 26.0 - 34.0 pg   MCHC 32.8 30.0 - 36.0 g/dL   RDW 08.6 57.8 - 46.9 %   Platelets 141 (L) 150 - 400 K/uL   nRBC 0.0 0.0 - 0.2 %    Comment: Performed at [facility], [address], [place], [place] [address]    Current Facility-Administered Medications  Medication Dose Route Frequency Provider Last Rate Last Admin   0.9 %  sodium chloride infusion   Intravenous Continuous Karl Ito, MD 150 mL/hr at 10/21/22 0815 Infusion Verify at 10/21/22 0815   acetaminophen (TYLENOL) tablet 650 mg  650 mg Oral Q6H PRN Charlsie Quest, MD       Or   acetaminophen (TYLENOL) suppository 650 mg  650 mg Rectal Q6H PRN Charlsie Quest, MD       bictegravir-emtricitabine-tenofovir AF (BIKTARVY) 50-200-25 MG per tablet 1 tablet  1 tablet Oral QHS Charlsie Quest, MD   1 tablet at 10/20/22 2107   bisacodyl (DULCOLAX) suppository 10 mg  10 mg Rectal Daily PRN Karl Ito, MD       Chlorhexidine Gluconate Cloth 2 % PADS 6 each  6 each Topical Daily Darreld Mclean R, MD   6 each at 10/18/22 1000   enoxaparin (LOVENOX) injection 40 mg  40 mg Subcutaneous QHS Darreld Mclean R, MD   40 mg at 10/20/22 2102   folic acid (FOLVITE) tablet 1 mg  1 mg Oral Daily Darreld Mclean R, MD   1 mg at 10/21/22 1001   haloperidol lactate (HALDOL) injection 5 mg  5 mg Intravenous Q6H PRN Gaetana Michaelis, MD   5 mg at 10/21/22 0140   hydrOXYzine (ATARAX) tablet 10 mg  10 mg Oral Q6H PRN Karl Ito, MD       levETIRAcetam (KEPPRA) IVPB 500 mg/100 mL premix  500 mg Intravenous Q12H Olalere, Adewale A, MD 400 mL/hr at 10/21/22 1005 500 mg at 10/21/22 1005   LORazepam (ATIVAN) injection 1 mg  1 mg Intravenous Q4H PRN  Gleason, Darcella Gasman, PA-C       LORazepam (ATIVAN) tablet 1 mg  1 mg Oral Q6H PRN Maryagnes Amos, FNP   1 mg at 10/21/22 0845   LORazepam (ATIVAN) tablet 1 mg  1 mg Oral QID Maryagnes Amos, FNP   1 mg at 10/21/22 1000   Followed by   LORazepam (ATIVAN) tablet 1 mg  1 mg Oral TID Maryagnes Amos, FNP       Followed by   Melene Muller ON 10/22/2022] LORazepam (ATIVAN) tablet 1 mg  1 mg Oral BID Maryagnes Amos, FNP       Followed by   Melene Muller ON 10/24/2022] LORazepam (ATIVAN) tablet 1 mg  1 mg Oral Daily Starkes-Perry, Juel Burrow, FNP       multivitamin with minerals tablet 1 tablet  1 tablet Oral Daily Darreld Mclean R, MD   1 tablet at 10/21/22 1001   ondansetron (ZOFRAN) injection 4 mg  4 mg Intravenous Q6H PRN Charlsie Quest, MD   4 mg at 10/19/22 1221   Oral care mouth rinse  15 mL Mouth Rinse PRN Charlsie Quest, MD       senna-docusate (Senokot-S) tablet 2 tablet  2 tablet Oral BID Olalere, Adewale A, MD   2 tablet at 10/21/22 1000   sodium chloride flush (NS) 0.9 % injection 3 mL  3 mL Intravenous Q12H Darreld Mclean R, MD   3 mL at 10/21/22 1002   thiamine (VITAMIN B1) tablet 100 mg  100 mg Oral Daily Darreld Mclean R, MD   100 mg at 10/21/22 1001   Or   thiamine (VITAMIN B1) injection 100 mg  100 mg Intravenous Daily Charlsie Quest, MD   100 mg at 10/18/22 1058    Musculoskeletal: Strength & Muscle Tone: within normal limits Gait & Station: normal Patient leans: N/A            Psychiatric Specialty Exam:  Presentation  General Appearance: Appropriate for Environment  Eye Contact:Fair  Speech:Clear and Coherent; Normal Rate  Speech Volume:Normal  Handedness:Right   Mood and Affect  Mood:Euthymic  Affect:Appropriate; Congruent   Thought Process  Thought Processes:Coherent; Linear  Descriptions of Associations:Intact  Orientation:Full (Time, Place and Person)  Thought Content:Logical  History of Schizophrenia/Schizoaffective  disorder:Yes  Duration of Psychotic Symptoms:No data recorded  Hallucinations:Hallucinations: None  Ideas of Reference:None  Suicidal Thoughts:Suicidal Thoughts: No  Homicidal Thoughts:Homicidal Thoughts: No   Sensorium  Memory:Immediate Good; Remote Good; Recent Good  Judgment:Fair  Insight:Good   Executive Functions  Concentration:Good  Attention Span:Good  Recall:Good  Fund of Knowledge:Good  Language:Good   Psychomotor Activity  Psychomotor Activity:Psychomotor Activity: Normal   Assets  Assets:Communication Skills; Desire for Improvement; Physical Health; Social Support   Sleep  Sleep:Sleep: Fair   Physical Exam: Physical Exam Vitals and nursing note reviewed.  Constitutional:      Appearance: Normal appearance. He is normal weight. He is not ill-appearing.  HENT:     Head: Normocephalic and atraumatic.     Mouth/Throat:     Pharynx: Oropharynx is clear.  Eyes:     Pupils: Pupils are equal, round, and reactive to light.  Musculoskeletal:        General: Normal range of motion.  Skin:    General: Skin is warm and dry.  Neurological:     General: No focal deficit present.     Mental Status: He is alert. Mental status is at baseline.  Psychiatric:        Attention and Perception: Attention normal.        Mood and Affect: Mood is anxious.        Speech: Speech normal.        Behavior: Behavior is cooperative.        Thought Content: Thought content normal.        Cognition and Memory: Cognition normal.        Judgment: Judgment normal.    Review of Systems  Constitutional:  Positive for malaise/fatigue and weight loss.  HENT: Negative.    Eyes: Negative.   Respiratory: Negative.    Cardiovascular: Negative.   Gastrointestinal:  Positive for heartburn, nausea and vomiting.  Musculoskeletal:  Positive for myalgias.  Skin: Negative.   Neurological:  Positive for tremors, seizures and headaches.  Psychiatric/Behavioral:  Positive for  depression and substance abuse. Negative for suicidal ideas.    Blood pressure (!) 100/55, pulse (!) 57, temperature 97.8 F (36.6 C), temperature source Oral, resp. rate 14, height 6' (1.829 m), weight 69 kg, SpO2 96 %. Body mass index is 20.63 kg/m.  Patient's presentation is most consistent with severe alcohol use disorder, exacerbation of chronic illness.  Patient's homelessness and impaired access to primary CARE/outpatient psychiatry increases the complexity of managing his current presentation with alcohol intoxication and request for detox.  Based off patient's current presentation, he continues to deny outpatient psychiatric services, although he requests medical detox and assistance with referral to [facility].  He denies any acute danger to himself and or others, despite multiple risk factors due to his chronic alcohol use.  On today's evaluation patient is currently lucid, alert and oriented, calm and cooperative no current signs of delirium. As of this note patient has only received 25 mg of Librium and 1 mg of Ativan.  .   Treatment Plan Summary: Treat alcohol use disorder with Ativan and Librium, and [facility] detox.   -Currently does not meet criteria for commitment.  As he denies being a danger to himself and or others.   -TOC consult for inpatient rehabilitation/substance abuse resources. -Recommend continuing safety sitter as patient is high risk for developing delirium tremens and will need ongoing monitoring. -Denies interest in starting Naltrexone. -Labs reviewed and assessed, remains with elevated liver enzymes due to her chronic alcohol use.  Blood alcohol level 448 on admission.   -Rescind IVC, expires today.  Has been consistently denying suicidal ideation since admission. Psychiatry service will continue to follow from a distance.  Disposition: Does not meet criteria for inpatient psychiatric admission.    Maryagnes Amos, FNP 10/21/2022  10:32 AM

## 2022-10-21 NOTE — Progress Notes (Signed)
eLink Physician-Brief Progress Note Patient Name: LAVIN PETTEWAY DOB: [birth year] MRN: [id number]   Date of Service  10/21/2022  HPI/Events of Note  Itching - Likely d/t ETOH withdrawal.   eICU Interventions  Plan: Atarax 10 mg PO Q 6 hours PRN itching.      Intervention Category Major Interventions: Other:  Tahiry Spicer Dennard Nip 10/21/2022, 1:40 AM

## 2022-10-22 DIAGNOSIS — B20 Human immunodeficiency virus [HIV] disease: Secondary | ICD-10-CM

## 2022-10-22 DIAGNOSIS — F1994 Other psychoactive substance use, unspecified with psychoactive substance-induced mood disorder: Secondary | ICD-10-CM

## 2022-10-22 LAB — BASIC METABOLIC PANEL
Anion gap: 10 (ref 5–15)
BUN: 8 mg/dL (ref 6–20)
CO2: 21 mmol/L — ABNORMAL LOW (ref 22–32)
Calcium: 9.1 mg/dL (ref 8.9–10.3)
Chloride: 105 mmol/L (ref 98–111)
Creatinine, Ser: 0.61 mg/dL (ref 0.61–1.24)
GFR, Estimated: >60 mL/min (ref >60)
Glucose, Bld: 93 mg/dL (ref 70–99)
Potassium: 3.7 mmol/L (ref 3.5–5.1)
Sodium: 136 mmol/L (ref 135–145)

## 2022-10-22 LAB — MAGNESIUM: Magnesium: 1.7 mg/dL (ref 1.7–2.4)

## 2022-10-22 LAB — FOLATE: Folate: 38.6 ng/mL (ref >5.9)

## 2022-10-22 MED ORDER — MAGNESIUM SULFATE 2 GM/50ML IV SOLN
2.0000 g | Freq: Once | INTRAVENOUS | Status: AC
  Administered 2022-10-22: 2 g via INTRAVENOUS
  Filled 2022-10-22: qty 50

## 2022-10-22 NOTE — Progress Notes (Signed)
Report called to Jena Gauss RN. All questions answered at this time. Patient belongings transported with patient, other than his wine bottle that is being held with security until discharge. Patient was transported in the wheelchair by NT, on tele. Handoff completed.

## 2022-10-22 NOTE — Progress Notes (Signed)
PROGRESS NOTE  Gary [name]  [id number] DOB: 1986-09-29 DOA: 10/16/2022 PCP: Patient, No Pcp Per   Brief Narrative: Patient is a 36 year old male with history of alcohol dependence, history of withdrawal with seizures, depression/anxiety, alcoholic pancreatitis who was admitted to behavioral health for alcohol intoxication and depression but was transferred to emergency department with concern for withdrawal.  Ultimately needed to be transferred to ICU under PCCM service needing Precedex drip.  Report of drinking half a gallon of vodka daily plus beer and wine.  Last drink on 10/15/2022.  Had tremors, diaphoresis, palpitations, nausea and vomiting with hallucinations.  Patient transferred to [facility] service on 12/14  Assessment & Plan:  Principal Problem:   Alcohol dependence with withdrawal (HCC) Active Problems:   History of seizures   Substance induced mood disorder (HCC)   HIV disease (HCC)   Thrombocytopenia (HCC)   Elevated LFTs   Delirium tremens ([place])   Severe alcohol withdrawal with delirium tremens:transferred to ICU under PCCM service needing Precedex drip.  Report of drinking half a gallon of vodka daily plus beer and wine.  Last drink on 10/15/2022.  Had tremors, diaphoresis, palpitations, nausea and vomiting with hallucinations.  Precedex drip has been discontinued.  On as needed Ativan.  Sitter at bedside.  Blood alcohol level of 448 on admission. He is alert and oriented this morning.  History of seizures: On Keppra  Elevated LFTs: Improved.  Pancytopenia/macrocytosis: Secondary to alcoholic liver disease.  Monitor CBC.  Continue thiamine folic acid.  Will check folate level.  Hyponatremia/hypokalemia/hypomagnesemia: Decreased oral solute intake.  Continue to monitor.  Encourage oral intake  HIV: On Biktarvy. Phenobarbital not  started secondary to interaction with Biktarvy  Social situation: Problem with alcoholism and patient is homeless.  Report of  anxiety/depression, hallucinations.  As per psychiatry,he  denies suicidal ideation.  Psychiatry following and saying does not meet criteria for inpatient psychiatry admission.       DVT prophylaxis:enoxaparin (LOVENOX) injection 40 mg Start: 10/16/22 2300     Code Status: Full Code  Family Communication: None at bedside  Patient status:Inpatient  Patient is from :Homeless  Anticipated discharge DY:[id number] environment  Estimated DC date:1-2 days   Consultants: PCCM,psychiatry  Procedures:None  Antimicrobials:  Anti-infectives (From admission, onward)    Start     Dose/Rate Route Frequency Ordered Stop   10/16/22 2300  bictegravir-emtricitabine-tenofovir AF (BIKTARVY) 50-200-25 MG per tablet 1 tablet        1 tablet Oral Daily at bedtime 10/16/22 2252         Subjective: Patient seen and examined at bedside today.  Hemodynamically stable.  Comfortable.  Lying in bed.  Alert and oriented.  Denies any new complaints.  He says he feels better and is motivated to quit alcohol and wants to go to 7 day program.  Objective: Vitals:   10/22/22 0400 10/22/22 0500 10/22/22 0600 10/22/22 0700  BP:    (!) 97/47  Pulse: 76 74 77 77  Resp: (!) 23 20 20 17   Temp:      TempSrc:      SpO2: 94% 95% 94% 92%  Weight:      Height:        Intake/Output Summary (Last 24 hours) at 10/22/2022 0729 Last data filed at 10/22/2022 0728 Gross per 24 hour  Intake 2345.67 ml  Output 3730 ml  Net -1384.33 ml   Filed Weights   10/17/22 1049  Weight: 69 kg    Examination:  General exam: Overall comfortable, not  in distress HEENT: PERRL Respiratory system:  no wheezes or crackles  Cardiovascular system: S1 & S2 heard, RRR.  Gastrointestinal system: Abdomen is nondistended, soft and nontender. Central nervous system: Alert and oriented Extremities: No edema, no clubbing ,no cyanosis Skin: No rashes, no ulcers,no icterus     Data Reviewed: I have personally reviewed following  labs and imaging studies  CBC: Recent Labs  Lab 10/15/22 2028 10/16/22 2036 10/17/22 1209 10/18/22 0239 10/19/22 0248 10/20/22 0823 10/21/22 0243  WBC 3.2* 3.3* 2.6* 2.4* 4.3 3.8* 3.5*  NEUTROABS 1.1* 2.1  --   --  3.1  --   --   HGB 12.9* 12.5* 13.1 12.6* 12.9* 12.1* 12.1*  HCT 38.3* 37.1* 38.8* 37.1* 38.7* 35.7* 36.9*  MCV 101.3* 100.8* 100.8* 100.3* 101.3* 100.8* 102.2*  PLT 123* 108* 106* 91* 102* 126* 141*   Basic Metabolic Panel: Recent Labs  Lab 10/17/22 1209 10/18/22 0239 10/19/22 0248 10/20/22 0823 10/21/22 0243 10/22/22 0250  NA 134* 138 134* 136 133* 136  K 3.9 3.7 3.7 3.8 3.6 3.7  CL 101 105 104 106 102 105  CO2 25 25 21* 23 23 21*  GLUCOSE 108* 117* 107* 101* 102* 93  BUN <5* 8 5* 5* 9 8  CREATININE 0.64 0.61 0.52* 0.43* 0.57* 0.61  CALCIUM 9.5 9.2 9.0 8.6* 9.0 9.1  MG 1.8 1.9 1.8 1.8 1.9 1.7  PHOS 3.3  --   --   --   --   --      Recent Results (from the past 240 hour(s))  SARS Coronavirus 2 by RT PCR (hospital order, performed in [facility] hospital lab) *cepheid single result test* Anterior Nasal Swab     Status: None   Collection Time: 10/16/22 12:59 PM   Specimen: Anterior Nasal Swab  Result Value Ref Range Status   SARS Coronavirus 2 by RT PCR NEGATIVE NEGATIVE Final    Comment: (NOTE) SARS-CoV-2 target nucleic acids are NOT DETECTED.  The SARS-CoV-2 RNA is generally detectable in upper and lower respiratory specimens during the acute phase of infection. The lowest concentration of SARS-CoV-2 viral copies this assay can detect is 250 copies / mL. A negative result does not preclude SARS-CoV-2 infection and should not be used as the sole basis for treatment or other patient management decisions.  A negative result may occur with improper specimen collection / handling, submission of specimen other than nasopharyngeal swab, presence of viral mutation(s) within the areas targeted by this assay, and inadequate number of viral copies (<250  copies / mL). A negative result must be combined with clinical observations, patient history, and epidemiological information.  Fact Sheet for Patients:   RoadLapTop.co.za  Fact Sheet for Healthcare Providers: http://kim-miller.com/  This test is not yet approved or  cleared by the [place] FDA and has been authorized for detection and/or diagnosis of SARS-CoV-2 by FDA under an Emergency Use Authorization (EUA).  This EUA will remain in effect (meaning this test can be used) for the duration of the COVID-19 declaration under Section 564(b)(1) of the Act, 21 U.S.C. section 360bbb-3(b)(1), unless the authorization is terminated or revoked sooner.  Performed at [facility], [address], [place], [place] [address]   MRSA Next Gen by PCR, Nasal     Status: None   Collection Time: 10/17/22  1:38 AM   Specimen: Nasal Mucosa; Nasal Swab  Result Value Ref Range Status   MRSA by PCR Next Gen NOT DETECTED NOT DETECTED Final  Comment: (NOTE) The GeneXpert MRSA Assay (FDA approved for NASAL specimens only), is one component of a comprehensive MRSA colonization surveillance program. It is not intended to diagnose MRSA infection nor to guide or monitor treatment for MRSA infections. Test performance is not FDA approved in patients less than 14 years old. Performed at [facility], [place] [address], [place], [place] [address]      Radiology Studies: No results found.  Scheduled Meds:  bictegravir-emtricitabine-tenofovir AF  1 tablet Oral QHS   Chlorhexidine Gluconate Cloth  6 each Topical Daily   enoxaparin (LOVENOX) injection  40 mg Subcutaneous QHS   folic acid  1 mg Oral Daily   levETIRAcetam  500 mg Oral BID   LORazepam  1 mg Oral TID   Followed by   LORazepam  1 mg Oral BID   Followed by   Derrill Memo ON 10/24/2022] LORazepam  1 mg Oral Daily   multivitamin with minerals  1 tablet Oral Daily    senna-docusate  2 tablet Oral BID   sodium chloride flush  3 mL Intravenous Q12H   thiamine  100 mg Oral Daily   Continuous Infusions:  sodium chloride 150 mL/hr at 10/22/22 0728     LOS: 6 days   Shelly Coss, MD [facility] P12/14/2023, 7:29 AM

## 2022-10-22 NOTE — TOC Progression Note (Addendum)
Transition of Care [facility]) - Progression Note    Patient Details  Name: Gary [name] MRN: [id number] Date of Birth: [birth year]  [facility] CM/SW Contact  Lavenia Atlas, RN Phone Number: 10/22/2022, 4:11 PM  Clinical Narrative:   Patient has history of homelessness, per chart review from [facility] prior to this admission, however this RNCM unable to locate. This RNCM awaiting a call back from [facility], [place] and shelter resources have been attached to AVS. Patient will need to follow up with [place] resource providers for further assistance.  - 4:15pm received call back patient was not seen at [facility].  No additional TOC needs.    Expected Discharge Plan: Homeless Shelter Barriers to Discharge: Continued Medical Work up  Expected Discharge Plan and Services Expected Discharge Plan: Homeless Shelter In-house Referral: NA Discharge Planning Services: CM Consult   Living arrangements for the past 2 months: Homeless Shelter                           HH Arranged: NA HH Agency: NA         Social Determinants of Health (SDOH) Interventions    Readmission Risk Interventions    06/01/2022    1:14 PM 04/27/2022    9:42 AM  Readmission Risk Prevention Plan  Transportation Screening Complete Complete  Medication Review (RN Care Manager) Referral to Pharmacy Referral to Pharmacy  PCP or Specialist appointment within 3-5 days of discharge Complete Complete  HRI or Home Care Consult Complete Complete  SW Recovery Care/Counseling Consult Complete Complete  Palliative Care Screening Not Applicable Not Applicable  Skilled Nursing Facility Not Applicable Not Applicable

## 2022-10-22 NOTE — Progress Notes (Signed)
Pt upset and refusing all night time meds. Pt wanting Ativan IV instead of PO scheduled. RN educated  pt, but was more upset and voiced " I will go back to drinking". After more education, pt agreed on taking meds, but still insisting on IV ativan.RN continue to monitor for withdrawal symptoms / CIWAA for further meds.

## 2022-10-22 NOTE — Progress Notes (Signed)
eLink Physician-Brief Progress Note Patient Name: Gary [name] DOB: 1986-06-01 MRN: [id number]   Date of Service  10/22/2022  HPI/Events of Note  Hypomagnesemia - Mg++ = 1.7 and Creatinine = 0.61.  eICU Interventions  Will replace Mg++.      Intervention Category Major Interventions: Electrolyte abnormality - evaluation and management  Heli Dino Eugene 10/22/2022, 5:44 AM

## 2022-10-22 NOTE — Consult Note (Signed)
[facility] Face-to-Face Psychiatry Consult   Reason for Consult: alcohol detox; si with plan to drinking himself to death Referring Physician: Wynona Neat Patient Identification: Gary [name] MRN:  [id number] Principal Diagnosis: Alcohol dependence with withdrawal Gary [name]) Diagnosis:  Principal Problem:   Alcohol dependence with withdrawal (HCC) Active Problems:   HIV disease (HCC)   Thrombocytopenia (HCC)   Substance induced mood disorder (HCC)   History of seizures   Elevated LFTs   Delirium tremens (HCC)   Total Time spent with patient: 45 minutes  Subjective:   Gary [name] is a 36 y.o. male patient admitted with alcohol intoxication and depression.  Patient was initially admitted to [facility], for the above however due to history and concerns for possible withdrawal he was transferred to the emergency department.  Patient is well-known to our behavioral health service line, currently has 22 admissions in the last 6 months ED/IP for alcohol induced withdrawal, alcohol intoxication, alcohol abuse, and seizures.   Patient seen and assessed by this psychiatric nurse practitioner.  Gary [name] has a significant substance use and alcohol use history, has been formally diagnosed with  alcohol use disorder, polysubstance use disorder.  He currently denies any recent illicit substance use.  He reports his longest period of sobriety for 2023 is about 3 weeks in between inpatient rehab and emergency room visit.  He is currently hospitalized for alcohol withdrawal and history of severe DTs on day 4/day 5.  Currently on Precedex drip, has received a total of 4 mg of Ativan and 50 mg of Librium in the past 24 hours.  No obvious seizure-like events have been noted thus far.  While he is within the time window to have a seizure in the setting of alcohol withdrawal, I believe that this is less likely given his clinical picture with stable vital signs and lack of withdrawal symptoms  this morning.  Today is day 5, will continue to monitor from a distance as patient remains in window for worsening alcohol withdrawal symptoms.  Pt seen in AM. Says he is doing OK. He says that he finally feels ready to quit drinking - applauded pt on recent strides made to improve his overall health (eg biktarvy compliance).  Has actually gained some weight. Today he is endorsing a desire to go to a 7 day rehab and names a couple he will need to call. Discussed need to use as little ativan as possible to get him out of the hospital in time to be out of rehab by Christmas, which seemed to help. Had mild tremors, but no hallucinations or clouding of sensorium. Had concrete (and achievable) short term plans (go to rehab), medium term plans (turn self in for theft (offered that it was wine when in withdrawal)) and long term plans (apply to pilot tiny house project, get disability). Good awareness that most people are denied disability on first application. No SI, HI, AH/VH.    HPI: Gary [name] is a 36 y.o. male patient admitted with a history of HIV, depression, alcohol abuse, and seizures. He presents with alcohol intoxication and depression.  Patient is homeless and has been drinking alcohol chronically.  Patient was admitted about a month ago for alcohol pancreatitis.  Patient continues to drink about 1.5 gallons of vodka a day.   Past Psychiatric History: Alcohol use disorder, PTSD, anxiety.  Previous psychiatric medication trials include Haldol, Librium, Ativan, phenobarbital.  He reports history of previous suicide attempts, by drinking himself to death  at 1 point in time.  He denies any current and or previous outpatient psychiatric provider.  He denies any history of psychiatric hospitalizations, however multiple inpatient rehabilitation admissions for alcohol use disorder.  He denies any recent of substance use.  Smokes about 4 to 5 cigarettes/day.  Risk to Self:  Denies Risk to Others:   Denies Prior Inpatient Therapy:  Denies, multiple inpatient rehabilitation admissions. Prior Outpatient Therapy:  Denies  Past Medical History:  Past Medical History:  Diagnosis Date   Alcohol abuse    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   Anxiety    Bipolar 2 disorder ([place])    HIV (human immunodeficiency virus infection) ([place])    Pancreatitis    PTSD (post-traumatic stress disorder)    Schizophrenia ([place])    Seizures ([place])    Subdural hematoma (HCC)     Past Surgical History:  Procedure Laterality Date   BIOPSY  04/19/2022   Procedure: BIOPSY;  Surgeon: Irving Copas., MD;  Location: [facility] ENDOSCOPY;  Service: Gastroenterology;;   ENTEROSCOPY N/A 04/19/2022   Procedure: ENTEROSCOPY;  Surgeon: Irving Copas., MD;  Location: [place];  Service: Gastroenterology;  Laterality: N/A;   INCISION AND DRAINAGE PERIRECTAL ABSCESS N/A 09/24/2016   Procedure: IRRIGATION AND DEBRIDEMENT PERIRECTAL ABSCESS;  Surgeon: Clayburn Pert, MD;  Location: [facility] ORS;  Service: General;  Laterality: N/A;   none     Family History:  Family History  Problem Relation Age of Onset   Alcohol abuse Mother    Alcohol abuse Father    Colon cancer Other    Other Other    Cancer Other    Family Psychiatric  History: Alcohol use in mother and father. Social History:  Social History   Substance and Sexual Activity  Alcohol Use Yes   Alcohol/week: 105.0 standard drinks of alcohol   Types: 105 Cans of beer per week     Social History   Substance and Sexual Activity  Drug Use Yes   Types: Methamphetamines   Comment: last used 04/10/2019    Social History   Socioeconomic History   Marital status: Single    Spouse name: Not on file   Number of children: Not on file   Years of education: Not on file   Highest education level: Not on file  Occupational History   Occupation: unemployed  Tobacco Use   Smoking status: Every Day    Packs/day: 0.50    Types: Cigarettes    Smokeless tobacco: Never   Tobacco comments:    unable to smoke while incarcerated 6+ months 02/13/20  Vaping Use   Vaping Use: Never used  Substance and Sexual Activity   Alcohol use: Yes    Alcohol/week: 105.0 standard drinks of alcohol    Types: 105 Cans of beer per week   Drug use: Yes    Types: Methamphetamines    Comment: last used 04/10/2019   Sexual activity: Yes    Partners: Male, Male    Comment: declined condoms  03/2021  Other Topics Concern   Not on file  Social History Narrative   "Currently living on the streets"   Independent at baseline   Occasionally goes to the [place] [facility]   Financial Resource Strain: Not on file  Food Insecurity: Food Insecurity Present (10/17/2022)   Hunger Vital Sign    Worried About Running Out of Food in the Last Year: Sometimes true    Ran Out of Food in the Last Year: Sometimes true  Transportation Needs: Unmet Transportation Needs (10/17/2022)   PRAPARE - Administrator, Civil Service (Medical): Yes    Lack of Transportation (Non-Medical): Yes  Physical Activity: Not on file  Stress: Not on file  Social Connections: Not on file   Additional Social History: He is homeless, unemployed seeking disability..    Allergies:   Allergies  Allergen Reactions   Tegretol [Carbamazepine] Other (See Comments)    Caused vertigo for 2 days after taking it   Caffeine Palpitations    Labs:  Results for orders placed or performed during the hospital encounter of 10/16/22 (from the past 48 hour(s))  Basic metabolic panel     Status: Abnormal   Collection Time: 10/21/22  2:43 AM  Result Value Ref Range   Sodium 133 (L) 135 - 145 mmol/L   Potassium 3.6 3.5 - 5.1 mmol/L   Chloride 102 98 - 111 mmol/L   CO2 23 22 - 32 mmol/L   Glucose, Bld 102 (H) 70 - 99 mg/dL    Comment: Glucose reference range applies only to samples taken after fasting for at least 8 hours.   BUN 9 6 - 20 mg/dL   Creatinine, Ser 6.94 (L)  0.61 - 1.24 mg/dL   Calcium 9.0 8.9 - 50.3 mg/dL   GFR, Estimated >88 >82 mL/min    Comment: (NOTE) Calculated using the CKD-EPI Creatinine Equation (2021)    Anion gap 8 5 - 15    Comment: Performed at [facility], [address], [place], [place] [address]  Magnesium     Status: None   Collection Time: 10/21/22  2:43 AM  Result Value Ref Range   Magnesium 1.9 1.7 - 2.4 mg/dL    Comment: Performed at [facility], [address] [place], [place] [address]  CBC     Status: Abnormal   Collection Time: 10/21/22  2:43 AM  Result Value Ref Range   WBC 3.5 (L) 4.0 - 10.5 K/uL   RBC 3.61 (L) 4.22 - 5.81 MIL/uL   Hemoglobin 12.1 (L) 13.0 - 17.0 g/dL   HCT 50.5 (L) 69.7 - 94.8 %   MCV 102.2 (H) 80.0 - 100.0 fL   MCH 33.5 26.0 - 34.0 pg   MCHC 32.8 30.0 - 36.0 g/dL   RDW 01.6 55.3 - 74.8 %   Platelets 141 (L) 150 - 400 K/uL   nRBC 0.0 0.0 - 0.2 %    Comment: Performed at [facility], [address] [place], [place] [address]  Basic metabolic panel     Status: Abnormal   Collection Time: 10/22/22  2:50 AM  Result Value Ref Range   Sodium 136 135 - 145 mmol/L   Potassium 3.7 3.5 - 5.1 mmol/L   Chloride 105 98 - 111 mmol/L   CO2 21 (L) 22 - 32 mmol/L   Glucose, Bld 93 70 - 99 mg/dL    Comment: Glucose reference range applies only to samples taken after fasting for at least 8 hours.   BUN 8 6 - 20 mg/dL   Creatinine, Ser 6.75 0.61 - 1.24 mg/dL   Calcium 9.1 8.9 - 44.9 mg/dL   GFR, Estimated >20 >10 mL/min    Comment: (NOTE) Calculated using the CKD-EPI Creatinine Equation (2021)    Anion gap 10 5 - 15    Comment: Performed at [facility], [address], [place], [place] [address]  Magnesium     Status: None  Collection Time: 10/22/22  2:50 AM  Result Value Ref Range   Magnesium 1.7 1.7 - 2.4 mg/dL    Comment: Performed at [facility], [place] [address], [place],  [address]     Current Facility-Administered Medications  Medication Dose Route Frequency Provider Last Rate Last Admin   acetaminophen (TYLENOL) tablet 650 mg  650 mg Oral Q6H PRN Lenore Cordia, MD       Or   acetaminophen (TYLENOL) suppository 650 mg  650 mg Rectal Q6H PRN Lenore Cordia, MD       bictegravir-emtricitabine-tenofovir AF (BIKTARVY) 50-200-25 MG per tablet 1 tablet  1 tablet Oral QHS Lenore Cordia, MD   1 tablet at 10/21/22 2107   bisacodyl (DULCOLAX) suppository 10 mg  10 mg Rectal Daily PRN Anders Simmonds, MD       Chlorhexidine Gluconate Cloth 2 % PADS 6 each  6 each Topical Daily Lenore Cordia, MD   6 each at 10/22/22 1117   enoxaparin (LOVENOX) injection 40 mg  40 mg Subcutaneous QHS Zada Finders R, MD   40 mg at [id number]   folic acid (FOLVITE) tablet 1 mg  1 mg Oral Daily Zada Finders R, MD   1 mg at 10/22/22 1022   haloperidol lactate (HALDOL) injection 5 mg  5 mg Intravenous Q6H PRN Laqueta Jean, MD   5 mg at 10/21/22 0140   hydrOXYzine (ATARAX) tablet 10 mg  10 mg Oral Q6H PRN Anders Simmonds, MD       levETIRAcetam (KEPPRA) tablet 500 mg  500 mg Oral BID Karren Cobble, RPH   500 mg at 10/22/22 1022   LORazepam (ATIVAN) tablet 1-4 mg  1-4 mg Oral Q1H PRN Cristal Generous, NP   2 mg at 10/22/22 1655   Or   LORazepam (ATIVAN) injection 1-4 mg  1-4 mg Intravenous Q1H PRN Cristal Generous, NP   2 mg at 10/22/22 0137   LORazepam (ATIVAN) tablet 1 mg  1 mg Oral BID Suella Broad, FNP       Followed by   Derrill Memo ON 10/24/2022] LORazepam (ATIVAN) tablet 1 mg  1 mg Oral Daily Starkes-Perry, Gayland Curry, FNP       multivitamin with minerals tablet 1 tablet  1 tablet Oral Daily Zada Finders R, MD   1 tablet at 10/22/22 1022   ondansetron (ZOFRAN) injection 4 mg  4 mg Intravenous Q6H PRN Lenore Cordia, MD   4 mg at 10/19/22 1221   Oral care mouth rinse  15 mL Mouth Rinse PRN Lenore Cordia, MD       senna-docusate (Senokot-S) tablet 2 tablet  2 tablet  Oral BID Olalere, Adewale A, MD   2 tablet at 10/21/22 2105   sodium chloride flush (NS) 0.9 % injection 3 mL  3 mL Intravenous Q12H Zada Finders R, MD   3 mL at 10/22/22 1023   thiamine (VITAMIN B1) tablet 100 mg  100 mg Oral Daily Lenore Cordia, MD   100 mg at 10/22/22 1022    Musculoskeletal: Strength & Muscle Tone: within normal limits Gait & Station: normal Patient leans: N/A            Psychiatric Specialty Exam:  Presentation  General Appearance: Appropriate for Environment  Eye Contact:Fair  Speech:Clear and Coherent; Normal Rate  Speech Volume:Normal  Handedness:Right   Mood and Affect  Mood:-- (Hopeful)  Affect:Appropriate; Congruent   Thought Process  Thought Processes:Coherent; Linear  Descriptions of Associations:Intact  Orientation:Full (Time, Place and Person)  Thought Content:Logical  History of Schizophrenia/Schizoaffective disorder:Yes  Duration of Psychotic Symptoms:No data recorded  Hallucinations:Hallucinations: None Description of Auditory Hallucinations: none Description of Visual Hallucinations: none   Ideas of Reference:None  Suicidal Thoughts:Suicidal Thoughts: No   Homicidal Thoughts:Homicidal Thoughts: No    Sensorium  Memory:Immediate Good; Recent Good; Remote Good  Judgment:Fair  Insight:Good   Executive Functions  Concentration:Good  Attention Span:Good  Recall:Good  Fund of Knowledge:Good  Language:Good   Psychomotor Activity  Psychomotor Activity:Psychomotor Activity: Normal    Assets  Assets:Communication Skills; Desire for Improvement; Physical Health   Sleep  Sleep:Sleep: Fair    Physical Exam: Physical Exam Vitals and nursing note reviewed.  Constitutional:      Appearance: Normal appearance. He is normal weight. He is not ill-appearing.  HENT:     Head: Normocephalic and atraumatic.     Mouth/Throat:     Pharynx: Oropharynx is clear.  Eyes:     Pupils: Pupils are  equal, round, and reactive to light.  Musculoskeletal:        General: Normal range of motion.  Skin:    General: Skin is warm and dry.  Neurological:     General: No focal deficit present.     Mental Status: He is alert. Mental status is at baseline.  Psychiatric:        Attention and Perception: Attention normal.        Mood and Affect: Mood is anxious.        Speech: Speech normal.        Behavior: Behavior is cooperative.        Thought Content: Thought content normal.        Cognition and Memory: Cognition normal.        Judgment: Judgment normal.    Review of Systems  Constitutional:  Positive for malaise/fatigue and weight loss.  HENT: Negative.    Eyes: Negative.   Respiratory: Negative.    Cardiovascular: Negative.   Gastrointestinal:  Positive for heartburn, nausea and vomiting.  Musculoskeletal:  Positive for myalgias.  Skin: Negative.   Neurological:  Positive for tremors, seizures and headaches.  Psychiatric/Behavioral:  Positive for depression and substance abuse. Negative for suicidal ideas.    Blood pressure 124/89, pulse 71, temperature 98 F (36.7 C), temperature source Oral, resp. rate 18, height 6' (1.829 m), weight 69 kg, SpO2 97 %. Body mass index is 20.63 kg/m.  Patient's presentation is most consistent with severe alcohol use disorder, exacerbation of chronic illness.  Patient's homelessness and impaired access to primary CARE/outpatient psychiatry increases the complexity of managing his current presentation with alcohol intoxication and request for detox.  Based off patient's current presentation, he continues to deny outpatient psychiatric services, although he requests medical detox and assistance with [facility].  He denies any acute danger to himself and or others, despite multiple risk factors due to his chronic alcohol use.  On today's evaluation patient is currently lucid, alert and oriented, calm and  cooperative no current signs of delirium. As of this note patient has only received 25 mg of Librium and 1 mg of Ativan.  .   Treatment Plan Summary: Treat alcohol use disorder with Ativan and Librium, and [facility] detox.   -Currently does not meet criteria for involuntary commitment.  As he denies being a danger to himself and or others and has been consistently future oriented for multiple  days.  -TOC consult for inpatient rehabilitation/substance abuse resources. - OK to dc safety  -Denied interest in starting Naltrexone. -Labs reviewed and assessed, remains with elevated liver enzymes due to her chronic alcohol use.  Blood alcohol level 448 on admission.    Psychiatry signing off.   Disposition: Does not meet criteria for inpatient psychiatric admission.    Kerrie [place] Celestine Prim 10/22/2022 5:45 PM

## 2022-10-23 ENCOUNTER — Other Ambulatory Visit (HOSPITAL_COMMUNITY): Payer: Self-pay

## 2022-10-23 MED ORDER — BICTEGRAVIR-EMTRICITAB-TENOFOV 50-200-25 MG PO TABS
1.0000 | ORAL_TABLET | Freq: Every day | ORAL | 0 refills | Status: DC
  Filled 2022-10-23: qty 30, 30d supply, fill #0

## 2022-10-23 MED ORDER — FOLIC ACID 1 MG PO TABS
1.0000 mg | ORAL_TABLET | Freq: Every day | ORAL | 0 refills | Status: DC
  Filled 2022-10-23: qty 30, 30d supply, fill #0

## 2022-10-23 MED ORDER — THIAMINE HCL 100 MG PO TABS
100.0000 mg | ORAL_TABLET | Freq: Every day | ORAL | 0 refills | Status: DC
  Filled 2022-10-23: qty 30, 30d supply, fill #0

## 2022-10-23 MED ORDER — LEVETIRACETAM 500 MG PO TABS
500.0000 mg | ORAL_TABLET | Freq: Two times a day (BID) | ORAL | 0 refills | Status: DC
  Filled 2022-10-23: qty 60, 30d supply, fill #0

## 2022-10-23 NOTE — Plan of Care (Signed)
Patient is discharging, he is stable and in no distress

## 2022-10-23 NOTE — Progress Notes (Signed)
Patient was discharged, AVS reviewed with patient and he acknowledged understanding. Prescription delivered to the nursing station and given to the patient( Biktarvy and Keppra) pt refused the vitamins. Clothing given to the patient due to his being misplaced.  Bus passed handed to the patient, security called to meet the patient at the entrance with his belongings.

## 2022-10-23 NOTE — Discharge Summary (Signed)
Physician Discharge Summary  Gary [name]:[id number] DOB: 04/23/1986 DOA: 10/16/2022  PCP: Patient, No Pcp Per  Admit date: 10/16/2022 Discharge date: 10/23/2022  Admitted From: Home Disposition:  Home  Discharge Condition:Stable CODE STATUS:FULL Diet recommendation: Regular  Brief/Interim Summary: Patient is a 36 year old male with history of alcohol dependence, history of withdrawal with seizures, depression/anxiety, alcoholic pancreatitis who was admitted to behavioral health for alcohol intoxication and depression but was transferred to emergency department with concern for withdrawal. Ultimately needed to be transferred to ICU under PCCM service needing Precedex drip.  Report of drinking half a gallon of vodka daily plus beer and wine.  Last drink on 10/15/2022.  Had tremors, diaphoresis, palpitations, nausea and vomiting with hallucinations.  [facility] .  Patient remains hemodynamically stable.  Currently alert and oriented.  Not in withdrawal.  Social worker helped with alcohol rehabilitation.  Medically stable for discharge.  Following problems were addressed  during his hospitalization:  Severe alcohol withdrawal with delirium tremens:transferred to ICU under PCCM service needing Precedex drip.  Report of drinking half a gallon of vodka daily plus beer and wine.  Last drink on 10/15/2022.  Had tremors, diaphoresis, palpitations, nausea and vomiting with hallucinations.  Precedex drip has been discontinued.  Blood alcohol level of 448 on admission. He is alert and oriented this morning.   History of seizures: On Keppra   Elevated LFTs: Improved.   Pancytopenia/macrocytosis: Secondary to alcoholic liver disease. Continue thiamine folic acid.    Hyponatremia/hypokalemia/hypomagnesemia: Decreased oral solute intake. Supplemented   HIV: On Biktarvy.    Social situation: Problem with alcoholism and patient is homeless.  Report of  anxiety/depression, hallucinations.  As per psychiatry,he  denies suicidal ideation.  Psychiatry following and saying does not meet criteria for inpatient psychiatry admission.  Discharge Diagnoses:  Principal Problem:   Alcohol dependence with withdrawal (HCC) Active Problems:   History of seizures   Substance induced mood disorder (HCC)   HIV disease (HCC)   Thrombocytopenia (HCC)   Elevated LFTs   Delirium tremens [facility](HCC)    Discharge Instructions  Discharge Instructions     Diet general   Complete by: As directed    Discharge instructions   Complete by: As directed    1)Please take prescribed medications as instructed. 2)Follow up with infectious disease in outpatient 3)Please quit alcohol.  Follow-up with alcohol rehabilitation   Increase activity slowly   Complete by: As directed       Allergies as of 10/23/2022       Reactions   Tegretol [carbamazepine] Other (See Comments)   Caused vertigo for 2 days after taking it   Caffeine Palpitations        Medication List     STOP taking these medications    CertaVite/Antioxidants Tabs       TAKE these medications    bictegravir-emtricitabine-tenofovir AF 50-200-25 MG Tabs tablet Commonly known as: BIKTARVY Take 1 tablet by mouth daily.   folic acid 1 MG tablet Commonly known as: FOLVITE Take 1 tablet (1 mg total) by mouth daily.   levETIRAcetam 500 MG tablet Commonly known as: KEPPRA Take 1 tablet (500 mg total) by mouth 2 (two) times daily.   thiamine 100 MG tablet Commonly known as: VITAMIN B1 Take 1 tablet (100 mg total) by mouth daily.        Allergies  Allergen Reactions   Tegretol [Carbamazepine] Other (See Comments)    Caused vertigo for 2 days after taking it  Caffeine Palpitations    Consultations: PCCM,psychiatry   Procedures/Studies: No results found.    Subjective: Patient seen and examined at bedside today.  Medically stable for discharge.  Discharge Exam: Vitals:    10/23/22 0400 10/23/22 0633  BP:  98/60  Pulse: 72 82  Resp:  18  Temp:  97.9 F (36.6 C)  SpO2:  96%   Vitals:   10/22/22 2054 10/23/22 0000 10/23/22 0400 10/23/22 0633  BP: 122/84 126/83  98/60  Pulse: 71 87 72 82  Resp: 20   18  Temp: 97.9 F (36.6 C)   97.9 F (36.6 C)  TempSrc: Oral   Oral  SpO2: 98%   96%  Weight:      Height:        General: Pt is alert, awake, not in acute distress Cardiovascular: RRR, S1/S2 +, no rubs, no gallops Respiratory: CTA bilaterally, no wheezing, no rhonchi Abdominal: Soft, NT, ND, bowel sounds + Extremities: no edema, no cyanosis    The results of significant diagnostics from this hospitalization (including imaging, microbiology, ancillary and laboratory) are listed below for reference.     Microbiology: Recent Results (from the past 240 hour(s))  SARS Coronavirus 2 by RT PCR (hospital order, performed in [facility] hospital lab) *cepheid single result test* Anterior Nasal Swab     Status: None   Collection Time: 10/16/22 12:59 PM   Specimen: Anterior Nasal Swab  Result Value Ref Range Status   SARS Coronavirus 2 by RT PCR NEGATIVE NEGATIVE Final    Comment: (NOTE) SARS-CoV-2 target nucleic acids are NOT DETECTED.  The SARS-CoV-2 RNA is generally detectable in upper and lower respiratory specimens during the acute phase of infection. The lowest concentration of SARS-CoV-2 viral copies this assay can detect is 250 copies / mL. A negative result does not preclude SARS-CoV-2 infection and should not be used as the sole basis for treatment or other patient management decisions.  A negative result may occur with improper specimen collection / handling, submission of specimen other than nasopharyngeal swab, presence of viral mutation(s) within the areas targeted by this assay, and inadequate number of viral copies (<250 copies / mL). A negative result must be combined with clinical observations, patient history, and  epidemiological information.  Fact Sheet for Patients:   RoadLapTop.co.za  Fact Sheet for Healthcare Providers: http://kim-miller.com/  This test is not yet approved or  cleared by the [place] FDA and has been authorized for detection and/or diagnosis of SARS-CoV-2 by FDA under an Emergency Use Authorization (EUA).  This EUA will remain in effect (meaning this test can be used) for the duration of the COVID-19 declaration under Section 564(b)(1) of the Act, 21 U.S.C. section 360bbb-3(b)(1), unless the authorization is terminated or revoked sooner.  Performed at [facility], [address], [place], [place] [address]   MRSA Next Gen by PCR, Nasal     Status: None   Collection Time: 10/17/22  1:38 AM   Specimen: Nasal Mucosa; Nasal Swab  Result Value Ref Range Status   MRSA by PCR Next Gen NOT DETECTED NOT DETECTED Final    Comment: (NOTE) The GeneXpert MRSA Assay (FDA approved for NASAL specimens only), is one component of a comprehensive MRSA colonization surveillance program. It is not intended to diagnose MRSA infection nor to guide or monitor treatment for MRSA infections. Test performance is not FDA approved in patients less than 21 years old. Performed at [facility], [address] [place],  [place] [address]      Labs: BNP (last 3 results) No results for input(s): "BNP" in the last 8760 hours. Basic Metabolic Panel: Recent Labs  Lab 10/17/22 1209 10/18/22 0239 10/19/22 0248 10/20/22 0823 10/21/22 0243 10/22/22 0250  NA 134* 138 134* 136 133* 136  K 3.9 3.7 3.7 3.8 3.6 3.7  CL 101 105 104 106 102 105  CO2 25 25 21* 23 23 21*  GLUCOSE 108* 117* 107* 101* 102* 93  BUN <5* 8 5* 5* 9 8  CREATININE 0.64 0.61 0.52* 0.43* 0.57* 0.61  CALCIUM 9.5 9.2 9.0 8.6* 9.0 9.1  MG 1.8 1.9 1.8 1.8 1.9 1.7  PHOS 3.3  --   --   --   --   --    Liver Function Tests: Recent Labs  Lab  10/16/22 2036 10/17/22 0809 10/17/22 1209  AST 140* 56* 87*  ALT 75* 39 63*  ALKPHOS 70 41 70  BILITOT 0.8 0.9 1.2  PROT 6.8 4.1* 7.0  ALBUMIN 3.3* 1.9* 3.5   Recent Labs  Lab 10/16/22 2036  LIPASE 220*   No results for input(s): "AMMONIA" in the last 168 hours. CBC: Recent Labs  Lab 10/16/22 2036 10/17/22 1209 10/18/22 0239 10/19/22 0248 10/20/22 0823 10/21/22 0243  WBC 3.3* 2.6* 2.4* 4.3 3.8* 3.5*  NEUTROABS 2.1  --   --  3.1  --   --   HGB 12.5* 13.1 12.6* 12.9* 12.1* 12.1*  HCT 37.1* 38.8* 37.1* 38.7* 35.7* 36.9*  MCV 100.8* 100.8* 100.3* 101.3* 100.8* 102.2*  PLT 108* 106* 91* 102* 126* 141*   Cardiac Enzymes: No results for input(s): "CKTOTAL", "CKMB", "CKMBINDEX", "TROPONINI" in the last 168 hours. BNP: Invalid input(s): "POCBNP" CBG: Recent Labs  Lab 10/17/22 0303 10/18/22 0243  GLUCAP 97 127*   D-Dimer No results for input(s): "DDIMER" in the last 72 hours. Hgb A1c No results for input(s): "HGBA1C" in the last 72 hours. Lipid Profile No results for input(s): "CHOL", "HDL", "LDLCALC", "TRIG", "CHOLHDL", "LDLDIRECT" in the last 72 hours. Thyroid function studies No results for input(s): "TSH", "T4TOTAL", "T3FREE", "THYROIDAB" in the last 72 hours.  Invalid input(s): "FREET3" Anemia work up Recent Labs    10/22/22 1328  FOLATE 38.6   Urinalysis    Component Value Date/Time   COLORURINE YELLOW 09/16/2022 2108   APPEARANCEUR CLEAR 09/16/2022 2108   APPEARANCEUR Clear 11/15/2014 1755   LABSPEC 1.009 09/16/2022 2108   LABSPEC 1.002 11/15/2014 1755   PHURINE 6.0 09/16/2022 2108   GLUCOSEU NEGATIVE 09/16/2022 2108   GLUCOSEU Negative 11/15/2014 1755   HGBUR NEGATIVE 09/16/2022 2108   BILIRUBINUR NEGATIVE 09/16/2022 2108   BILIRUBINUR Negative 11/15/2014 1755   KETONESUR NEGATIVE 09/16/2022 2108   PROTEINUR NEGATIVE 09/16/2022 2108   NITRITE NEGATIVE 09/16/2022 2108   LEUKOCYTESUR NEGATIVE 09/16/2022 2108   LEUKOCYTESUR Negative 11/15/2014  1755   Sepsis Labs Recent Labs  Lab 10/18/22 0239 10/19/22 0248 10/20/22 0823 10/21/22 0243  WBC 2.4* 4.3 3.8* 3.5*   Microbiology Recent Results (from the past 240 hour(s))  SARS Coronavirus 2 by RT PCR (hospital order, performed in [facility] hospital lab) *cepheid single result test* Anterior Nasal Swab     Status: None   Collection Time: 10/16/22 12:59 PM   Specimen: Anterior Nasal Swab  Result Value Ref Range Status   SARS Coronavirus 2 by RT PCR NEGATIVE NEGATIVE Final    Comment: (NOTE) SARS-CoV-2 target nucleic acids are NOT DETECTED.  The SARS-CoV-2 RNA is generally detectable in upper and  lower respiratory specimens during the acute phase of infection. The lowest concentration of SARS-CoV-2 viral copies this assay can detect is 250 copies / mL. A negative result does not preclude SARS-CoV-2 infection and should not be used as the sole basis for treatment or other patient management decisions.  A negative result may occur with improper specimen collection / handling, submission of specimen other than nasopharyngeal swab, presence of viral mutation(s) within the areas targeted by this assay, and inadequate number of viral copies (<250 copies / mL). A negative result must be combined with clinical observations, patient history, and epidemiological information.  Fact Sheet for Patients:   RoadLapTop.co.za  Fact Sheet for Healthcare Providers: http://kim-miller.com/  This test is not yet approved or  cleared by the [place] FDA and has been authorized for detection and/or diagnosis of SARS-CoV-2 by FDA under an Emergency Use Authorization (EUA).  This EUA will remain in effect (meaning this test can be used) for the duration of the COVID-19 declaration under Section 564(b)(1) of the Act, 21 U.S.C. section 360bbb-3(b)(1), unless the authorization is terminated or revoked sooner.  Performed at [facility], [address] [place], [place] [address]   MRSA Next Gen by PCR, Nasal     Status: None   Collection Time: 10/17/22  1:38 AM   Specimen: Nasal Mucosa; Nasal Swab  Result Value Ref Range Status   MRSA by PCR Next Gen NOT DETECTED NOT DETECTED Final    Comment: (NOTE) The GeneXpert MRSA Assay (FDA approved for NASAL specimens only), is one component of a comprehensive MRSA colonization surveillance program. It is not intended to diagnose MRSA infection nor to guide or monitor treatment for MRSA infections. Test performance is not FDA approved in patients less than 7 years old. Performed at [facility], [address], [place], [place] [address]     Please note: You were cared for by a hospitalist during your hospital stay. Once you are discharged, your primary care physician will handle any further medical issues. Please note that NO REFILLS for any discharge medications will be authorized once you are discharged, as it is imperative that you return to your primary care physician (or establish a relationship with a primary care physician if you do not have one) for your post hospital discharge needs so that they can reassess your need for medications and monitor your lab values.    Time coordinating discharge: 40 minutes  SIGNED:   Burnadette Pop, MD  [facility] 10/23/2022, 10:51 AM Pager [phone]  If 7PM-7AM, please contact night-coverage [email or web address] Password [email or web address]

## 2022-10-23 NOTE — Progress Notes (Signed)
Patient is refusing all labs, MD made aware

## 2022-10-23 NOTE — Plan of Care (Signed)
Patient alert and oriented x 4. On CIWA protocol, medicated per MAR. Independent to the bathroom. VS monitored. Safety and fall precautions observed.   Problem: Education: Goal: Knowledge of General Education information will improve Description: Including pain rating scale, medication(s)/side effects and non-pharmacologic comfort measures Outcome: Progressing   Problem: Health Behavior/Discharge Planning: Goal: Ability to manage health-related needs will improve Outcome: Progressing   Problem: Clinical Measurements: Goal: Ability to maintain clinical measurements within normal limits will improve Outcome: Progressing Goal: Will remain free from infection Outcome: Progressing   Problem: Coping: Goal: Level of anxiety will decrease Outcome: Progressing   Problem: Pain Managment: Goal: General experience of comfort will improve Outcome: Progressing   Problem: Safety: Goal: Ability to remain free from injury will improve Outcome: Progressing

## 2022-10-23 NOTE — TOC Transition Note (Signed)
Transition of Care [facility]) - CM/SW Discharge Note   Patient Details  Name: Gary [name] MRN: [id number] Date of Birth: 02/19/1986  [facility]) CM/SW Contact:  Vassie Moselle, LCSW Phone Number: 10/23/2022, 1:05 PM   Clinical Narrative:    CSW met with pt at pt's request. Pt states he wants to go to a 7 day rehab at discharge. CSW shared that rehab programs are typically 14 to 28 days. Pt states he does not want to go for longer than 7 days as he wants to be out for Christmas. Pt states he has wine that was taken by security and would like to ensure this is given back at discharge. Pt states he is going to "give it to a friend."  CSW notified pt's clothes were not found with pt's belongings. CSW provided pt with clothing from the clothing closet. Bus pass was given to RN to give to pt at discharge.  Pt has used MATCH 2x within this past year and is not eligible for MATCH at this time.    Final next level of care: Homeless Shelter Barriers to Discharge: Barriers Resolved   Patient Goals and CMS Choice Patient states their goals for this hospitalization and ongoing recovery are:: unable to state at this time CMS Medicare.gov Compare Post Acute Care list provided to:: Patient Choice offered to / list presented to : Patient  Discharge Placement                       Discharge Plan and Services In-house Referral: NA Discharge Planning Services: CM Consult                      HH Arranged: NA HH Agency: NA        Social Determinants of Health (SDOH) Interventions Food Insecurity Interventions: Inpatient TOC, Other (Comment) (Resources provided) Housing Interventions: Other (Comment), Inpatient TOC (Resources provided) Transportation Interventions: Other (Comment) (Resources provided)   Readmission Risk Interventions    10/23/2022   11:31 AM 10/23/2022    9:05 AM 06/01/2022    1:14 PM  Readmission Risk Prevention Plan  Transportation Screening  Complete Complete Complete  Medication Review ([place]) Complete Complete Referral to Pharmacy  PCP or Specialist appointment within 3-5 days of discharge  Complete Complete  HRI or Page  Complete Complete  SW Recovery Care/Counseling Consult  Complete Complete  Palliative Care Screening  Not Applicable Not [place]  Not Applicable Not Applicable

## 2022-10-26 ENCOUNTER — Encounter (HOSPITAL_COMMUNITY): Payer: Self-pay

## 2022-10-26 ENCOUNTER — Emergency Department (HOSPITAL_COMMUNITY)
Admission: EM | Admit: 2022-10-26 | Discharge: 2022-10-27 | Disposition: A | Discharge status: Home / Self Care | Payer: Medicaid Other | Attending: Emergency Medicine | Admitting: Emergency Medicine

## 2022-10-26 ENCOUNTER — Other Ambulatory Visit: Payer: Self-pay

## 2022-10-26 DIAGNOSIS — F10129 Alcohol abuse with intoxication, unspecified: Secondary | ICD-10-CM | POA: Insufficient documentation

## 2022-10-26 DIAGNOSIS — R410 Disorientation, unspecified: Secondary | ICD-10-CM | POA: Insufficient documentation

## 2022-10-26 DIAGNOSIS — F1020 Alcohol dependence, uncomplicated: Secondary | ICD-10-CM

## 2022-10-26 NOTE — ED Triage Notes (Signed)
Pt BIB [facility] with reports of ETOH. Last drink today, pt only had 6 beers rather than his normal 5th of liquor.

## 2022-10-27 NOTE — ED Provider Notes (Signed)
[facility] DEPT Provider Note   CSN: [id number] Arrival date & time: 10/26/22  2054     History  Chief Complaint  Patient presents with   Alcohol Intoxication    Gary [name] is a 36 y.o. male.  HPI     36 y/o male with history of HIV, alcoholism comes in with chief complaint of alcoholism.  Patient had waited 19 hours prior to my assessment.  He states that while in the waiting room he has been taking sips of alcohol.  He does not think he is ready to quit alcohol.  He is not requesting that we discharge him.  He states that he came to the ER because he felt " lost" while drinking alcohol yesterday and he had no choice but to call EMS.  Patient states that he now feels better.  He is aware he is at [facility] long.  He actually stepped away to go to a friend's center to grab lunch.  Home Medications Prior to Admission medications   Medication Sig Start Date End Date Taking? Authorizing Provider  bictegravir-emtricitabine-tenofovir AF (BIKTARVY) 50-200-25 MG TABS tablet Take 1 tablet by mouth daily. 10/23/22   Shelly Coss, MD  folic acid (FOLVITE) 1 MG tablet Take 1 tablet (1 mg total) by mouth daily. 10/23/22   Shelly Coss, MD  levETIRAcetam (KEPPRA) 500 MG tablet Take 1 tablet (500 mg total) by mouth 2 (two) times daily. 10/23/22   Shelly Coss, MD  thiamine (VITAMIN B1) 100 MG tablet Take 1 tablet (100 mg total) by mouth daily. 10/23/22   Shelly Coss, MD  famotidine (PEPCID) 20 MG tablet Take 1 tablet (20 mg total) by mouth 2 (two) times daily. Patient not taking: Reported on 06/01/2019 05/14/19 06/02/19  Davonna Belling, MD      Allergies    Tegretol [carbamazepine] and Caffeine    Review of Systems   Review of Systems  Physical Exam Updated Vital Signs BP 108/62   Pulse (!) 106   Temp 98.2 F (36.8 C) (Oral)   Resp 18   Ht 6' (1.829 m)   Wt 69 kg   SpO2 97%   BMI 20.63 kg/m  Physical Exam Vitals and nursing note  reviewed.  Constitutional:      Appearance: He is well-developed.  HENT:     Head: Atraumatic.  Cardiovascular:     Rate and Rhythm: Normal rate.  Pulmonary:     Effort: Pulmonary effort is normal.  Musculoskeletal:     Cervical back: Neck supple.  Skin:    General: Skin is warm.  Neurological:     Mental Status: He is alert and oriented to person, place, and time.     Gait: Gait normal.     ED Results / Procedures / Treatments   Labs (all labs ordered are listed, but only abnormal results are displayed) Labs Reviewed - No data to display  EKG None  Radiology No results found.  Procedures Procedures    Medications Ordered in ED Medications - No data to display  ED Course/ Medical Decision Making/ A&P                           Medical Decision Making  36 year old male comes in with chief complaint of confusion.  It appears that he was lost and confused yesterday because he was inebriated secondary to his alcohol use.  He called 911.  He has been in the ER  now 19 hours and is feeling better.  He has left the ER, had lunch and return.  He indicates that he has been drinking some even in the waiting room.  He does not feel like he is ready to stop drinking alcohol.  At this time is not having any severe withdrawals.  He is ambulating.  He is clinically sober.  He is stable for discharge.  Patient states that he wants to leave, as he is to be home to take care of some of his friends.  Final Clinical Impression(s) / ED Diagnoses Final diagnoses:  Alcoholism [facility])    Rx / DC Orders ED Discharge Orders     None         Derwood Kaplan, MD 10/27/22 1749

## 2022-10-30 ENCOUNTER — Other Ambulatory Visit: Payer: Self-pay

## 2022-10-30 ENCOUNTER — Encounter (HOSPITAL_COMMUNITY): Payer: Self-pay

## 2022-10-30 ENCOUNTER — Emergency Department (HOSPITAL_COMMUNITY)
Admission: EM | Admit: 2022-10-30 | Discharge: 2022-10-31 | Discharge status: Against Medical Advice | Payer: Medicaid Other | Attending: Emergency Medicine | Admitting: Emergency Medicine

## 2022-10-30 DIAGNOSIS — Z5321 Procedure and treatment not carried out due to patient leaving prior to being seen by health care provider: Secondary | ICD-10-CM | POA: Diagnosis not present

## 2022-10-30 DIAGNOSIS — F10120 Alcohol abuse with intoxication, uncomplicated: Secondary | ICD-10-CM | POA: Diagnosis present

## 2022-10-30 DIAGNOSIS — Y908 Blood alcohol level of 240 mg/100 ml or more: Secondary | ICD-10-CM | POA: Insufficient documentation

## 2022-10-30 LAB — CBC WITH DIFFERENTIAL/PLATELET
Abs Immature Granulocytes: 0.01 10*3/uL (ref 0.00–0.07)
Basophils Absolute: 0.1 10*3/uL (ref 0.0–0.1)
Basophils Relative: 3 %
Eosinophils Absolute: 0.1 10*3/uL (ref 0.0–0.5)
Eosinophils Relative: 2 %
HCT: 42.2 % (ref 39.0–52.0)
Hemoglobin: 14 g/dL (ref 13.0–17.0)
Immature Granulocytes: 0 %
Lymphocytes Relative: 47 %
Lymphs Abs: 2.1 10*3/uL (ref 0.7–4.0)
MCH: 33.6 pg (ref 26.0–34.0)
MCHC: 33.2 g/dL (ref 30.0–36.0)
MCV: 101.2 fL — ABNORMAL HIGH (ref 80.0–100.0)
Monocytes Absolute: 0.2 10*3/uL (ref 0.1–1.0)
Monocytes Relative: 4 %
Neutro Abs: 2 10*3/uL (ref 1.7–7.7)
Neutrophils Relative %: 44 %
Platelets: 521 10*3/uL — ABNORMAL HIGH (ref 150–400)
RBC: 4.17 MIL/uL — ABNORMAL LOW (ref 4.22–5.81)
RDW: 13.6 % (ref 11.5–15.5)
WBC: 4.5 10*3/uL (ref 4.0–10.5)
nRBC: 0 % (ref 0.0–0.2)

## 2022-10-30 LAB — BASIC METABOLIC PANEL
Anion gap: 11 (ref 5–15)
BUN: 5 mg/dL — ABNORMAL LOW (ref 6–20)
CO2: 27 mmol/L (ref 22–32)
Calcium: 8.6 mg/dL — ABNORMAL LOW (ref 8.9–10.3)
Chloride: 106 mmol/L (ref 98–111)
Creatinine, Ser: 0.63 mg/dL (ref 0.61–1.24)
GFR, Estimated: >60 mL/min (ref >60)
Glucose, Bld: 84 mg/dL (ref 70–99)
Potassium: 3.8 mmol/L (ref 3.5–5.1)
Sodium: 144 mmol/L (ref 135–145)

## 2022-10-30 LAB — LACTIC ACID, PLASMA: Lactic Acid, Venous: 2.3 mmol/L (ref 0.5–1.9)

## 2022-10-30 LAB — ETHANOL: Alcohol, Ethyl (B): 397 mg/dL (ref <10)

## 2022-10-30 NOTE — ED Provider Triage Note (Signed)
Emergency Medicine Provider Triage Evaluation Note  Gary [name] , a 36 y.o. male  was evaluated in triage.  Pt complains of alcohol intoxication.  Patient was brought in by EMS after complaints at a [employer] where security had to restrain him due to being intoxicated in public.  He reported to me that he typically drinks about a half a gallon of vodka daily.  Last drink was this morning.  Denied any other substance use at this time.  Denies chest pain, shortness of breath, abdominal pain, nausea, vomiting, diarrhea.  Review of Systems  Positive: As above Negative: As above  Physical Exam  BP 120/84 (BP Location: Left Arm)   Pulse 93   Temp 97.9 F (36.6 C) (Oral)   Resp 16   Ht 6' (1.829 m)   Wt 70.3 kg   SpO2 95%   BMI 21.02 kg/m  Gen:   Awake, slurred speech, alcohol odor Resp:  Normal effort MSK:   Moves extremities without difficulty. Sluggish response Other:  No obvious oculomotor dysfunction. Patient is able to follow respond to verbal commands.  Denied preserved gait on exam.  Medical Decision Making  Medically screening exam initiated at 5:32 PM.  Appropriate orders placed.  Gary [name] was informed that the remainder of the evaluation will be completed by another provider, this initial triage assessment does not replace that evaluation, and the importance of remaining in the ED until their evaluation is complete.     Smitty Knudsen, PA-C 10/30/22 1737

## 2022-10-30 NOTE — ED Triage Notes (Signed)
Pt to ED via [facility]. Pt unable to walk d/t ETOH consumption. Security from [employer] called EMS. EMS states that pt was able to stand into recliner.

## 2022-10-31 NOTE — ED Notes (Signed)
Pt name called for updated vitals, no response 

## 2022-11-06 LAB — VITAMIN B1: Vitamin B1 (Thiamine): 168.2 nmol/L (ref 66.5–200.0)

## 2022-11-07 ENCOUNTER — Other Ambulatory Visit: Payer: Self-pay

## 2022-11-07 ENCOUNTER — Encounter (HOSPITAL_COMMUNITY): Payer: Self-pay

## 2022-11-07 ENCOUNTER — Emergency Department (HOSPITAL_COMMUNITY)
Admission: EM | Admit: 2022-11-07 | Discharge: 2022-11-08 | Disposition: A | Discharge status: Home / Self Care | Payer: Medicaid Other | Attending: Student | Admitting: Student

## 2022-11-07 DIAGNOSIS — R4781 Slurred speech: Secondary | ICD-10-CM | POA: Insufficient documentation

## 2022-11-07 DIAGNOSIS — Z21 Asymptomatic human immunodeficiency virus [HIV] infection status: Secondary | ICD-10-CM | POA: Diagnosis not present

## 2022-11-07 DIAGNOSIS — F1310 Sedative, hypnotic or anxiolytic abuse, uncomplicated: Secondary | ICD-10-CM | POA: Insufficient documentation

## 2022-11-07 DIAGNOSIS — Y908 Blood alcohol level of 240 mg/100 ml or more: Secondary | ICD-10-CM | POA: Diagnosis not present

## 2022-11-07 DIAGNOSIS — R45851 Suicidal ideations: Secondary | ICD-10-CM | POA: Diagnosis not present

## 2022-11-07 DIAGNOSIS — E876 Hypokalemia: Secondary | ICD-10-CM | POA: Diagnosis not present

## 2022-11-07 DIAGNOSIS — R569 Unspecified convulsions: Secondary | ICD-10-CM | POA: Diagnosis not present

## 2022-11-07 DIAGNOSIS — F1094 Alcohol use, unspecified with alcohol-induced mood disorder: Secondary | ICD-10-CM | POA: Diagnosis not present

## 2022-11-07 DIAGNOSIS — R440 Auditory hallucinations: Secondary | ICD-10-CM | POA: Diagnosis present

## 2022-11-07 MED ORDER — LORAZEPAM 2 MG/ML IJ SOLN: 0.0000 mg | Freq: Four times a day (QID) | INTRAMUSCULAR | Status: DC

## 2022-11-07 MED ORDER — LORAZEPAM 1 MG PO TABS: 0.0000 mg | ORAL_TABLET | Freq: Two times a day (BID) | ORAL | Status: DC

## 2022-11-07 MED ORDER — THIAMINE HCL 100 MG/ML IJ SOLN: 100.0000 mg | Freq: Every day | INTRAMUSCULAR | Status: DC

## 2022-11-07 MED ORDER — THIAMINE MONONITRATE 100 MG PO TABS: 100.0000 mg | ORAL_TABLET | Freq: Every day | ORAL | Status: DC

## 2022-11-07 MED ORDER — LORAZEPAM 2 MG/ML IJ SOLN: 0.0000 mg | Freq: Two times a day (BID) | INTRAMUSCULAR | Status: DC

## 2022-11-07 MED ORDER — LORAZEPAM 1 MG PO TABS
0.0000 mg | ORAL_TABLET | Freq: Four times a day (QID) | ORAL | Status: DC
  Administered 2022-11-08: 2 mg via ORAL
  Filled 2022-11-07: qty 2

## 2022-11-07 MED ORDER — NICOTINE 21 MG/24HR TD PT24: 21.0000 mg | MEDICATED_PATCH | Freq: Every day | TRANSDERMAL | Status: DC

## 2022-11-07 NOTE — ED Triage Notes (Signed)
Pt arrived to triage making homicidal thoughts. Pt states that he is having visual and auditory hallucinations.   Pt has ETOH on board

## 2022-11-07 NOTE — ED Provider Notes (Incomplete)
[facility] EMERGENCY DEPARTMENT Provider Note   CSN: [id number] Arrival date & time: 11/07/22  2159     History {Add pertinent medical, surgical, social history, OB history to HPI:1} Chief Complaint  Patient presents with   Psychiatric Evaluation    STEPHAUN MILLION is a 36 y.o. male.  37 y/o male presenting for suicidal ideations. States that he is just "trying to end it"; has been attempting to drink himself to death. Denies illicit drug use. Reported auditory and visual hallucinations to triage RN.   The history is provided by the patient. No language interpreter was used.       Home Medications Prior to Admission medications   Medication Sig Start Date End Date Taking? Authorizing Provider  bictegravir-emtricitabine-tenofovir AF (BIKTARVY) 50-200-25 MG TABS tablet Take 1 tablet by mouth daily. 10/23/22   Burnadette Pop, MD  folic acid (FOLVITE) 1 MG tablet Take 1 tablet (1 mg total) by mouth daily. 10/23/22   Burnadette Pop, MD  levETIRAcetam (KEPPRA) 500 MG tablet Take 1 tablet (500 mg total) by mouth 2 (two) times daily. 10/23/22   Burnadette Pop, MD  thiamine (VITAMIN B1) 100 MG tablet Take 1 tablet (100 mg total) by mouth daily. 10/23/22   Burnadette Pop, MD  famotidine (PEPCID) 20 MG tablet Take 1 tablet (20 mg total) by mouth 2 (two) times daily. Patient not taking: Reported on 06/01/2019 05/14/19 06/02/19  Benjiman Core, MD      Allergies    Tegretol [carbamazepine] and Caffeine    Review of Systems   Review of Systems  Unable to perform ROS: Other  Patient intoxicated  Physical Exam Updated Vital Signs BP 104/79 (BP Location: Right Arm)   Pulse 99   Temp 97.9 F (36.6 C) (Oral)   Resp 18   Ht 6' (1.829 m)   Wt 68 kg   SpO2 95%   BMI 20.34 kg/m   Physical Exam Vitals and nursing note reviewed.  Constitutional:      General: He is not in acute distress.    Appearance: He is well-developed. He is not diaphoretic.      Comments: Clinically intoxicated  HENT:     Head: Normocephalic and atraumatic.  Eyes:     General: No scleral icterus.    Conjunctiva/sclera: Conjunctivae normal.  Pulmonary:     Effort: Pulmonary effort is normal. No respiratory distress.     Comments: Respirations even and unlabored Musculoskeletal:        General: Normal range of motion.     Cervical back: Normal range of motion.  Skin:    General: Skin is warm and dry.     Coloration: Skin is not pale.     Findings: No erythema or rash.  Neurological:     Mental Status: He is alert and oriented to person, place, and time.  Psychiatric:        Mood and Affect: Mood is depressed.        Speech: Speech is slurred.        Behavior: Behavior is slowed.        Thought Content: Thought content includes suicidal ideation. Thought content includes suicidal plan.     ED Results / Procedures / Treatments   Labs (all labs ordered are listed, but only abnormal results are displayed) Labs Reviewed  CBC WITH DIFFERENTIAL/PLATELET  COMPREHENSIVE METABOLIC PANEL  ETHANOL  RAPID URINE DRUG SCREEN, HOSP PERFORMED  ACETAMINOPHEN LEVEL  SALICYLATE LEVEL    EKG None  Radiology No results found.  Procedures Procedures  {Document cardiac monitor, telemetry assessment procedure when appropriate:1}  Medications Ordered in ED Medications  LORazepam (ATIVAN) injection 0-4 mg (has no administration in time range)    Or  LORazepam (ATIVAN) tablet 0-4 mg (has no administration in time range)  LORazepam (ATIVAN) injection 0-4 mg (has no administration in time range)    Or  LORazepam (ATIVAN) tablet 0-4 mg (has no administration in time range)  thiamine (VITAMIN B1) tablet 100 mg (has no administration in time range)    Or  thiamine (VITAMIN B1) injection 100 mg (has no administration in time range)  nicotine (NICODERM CQ - dosed in mg/24 hours) patch 21 mg (has no administration in time range)    ED Course/ Medical Decision Making/  A&P                           Medical Decision Making Amount and/or Complexity of Data Reviewed Labs: ordered.  Risk OTC drugs. Prescription drug management.   ***  {Document critical care time when appropriate:1} {Document review of labs and clinical decision tools ie heart score, Chads2Vasc2 etc:1}  {Document your independent review of radiology images, and any outside records:1} {Document your discussion with family members, caretakers, and with consultants:1} {Document social determinants of health affecting pt's care:1} {Document your decision making why or why not admission, treatments were needed:1} Final Clinical Impression(s) / ED Diagnoses Final diagnoses:  None    Rx / DC Orders ED Discharge Orders     None

## 2022-11-08 ENCOUNTER — Emergency Department (HOSPITAL_COMMUNITY)
Admission: EM | Admit: 2022-11-08 | Discharge: 2022-11-09 | Disposition: A | Discharge status: Home / Self Care | Payer: Medicaid Other | Attending: Emergency Medicine | Admitting: Emergency Medicine

## 2022-11-08 ENCOUNTER — Emergency Department (HOSPITAL_COMMUNITY): Payer: Medicaid Other

## 2022-11-08 ENCOUNTER — Encounter (HOSPITAL_COMMUNITY): Payer: Self-pay

## 2022-11-08 DIAGNOSIS — F1010 Alcohol abuse, uncomplicated: Secondary | ICD-10-CM | POA: Insufficient documentation

## 2022-11-08 DIAGNOSIS — Z21 Asymptomatic human immunodeficiency virus [HIV] infection status: Secondary | ICD-10-CM | POA: Insufficient documentation

## 2022-11-08 DIAGNOSIS — Y908 Blood alcohol level of 240 mg/100 ml or more: Secondary | ICD-10-CM | POA: Insufficient documentation

## 2022-11-08 DIAGNOSIS — R569 Unspecified convulsions: Secondary | ICD-10-CM

## 2022-11-08 DIAGNOSIS — F1310 Sedative, hypnotic or anxiolytic abuse, uncomplicated: Secondary | ICD-10-CM | POA: Insufficient documentation

## 2022-11-08 LAB — CBC WITH DIFFERENTIAL/PLATELET
Abs Immature Granulocytes: 0 10*3/uL (ref 0.00–0.07)
Abs Immature Granulocytes: 0.01 10*3/uL (ref 0.00–0.07)
Basophils Absolute: 0 10*3/uL (ref 0.0–0.1)
Basophils Absolute: 0 10*3/uL (ref 0.0–0.1)
Basophils Relative: 1 %
Basophils Relative: 1 %
Eosinophils Absolute: 0.1 10*3/uL (ref 0.0–0.5)
Eosinophils Absolute: 0.1 10*3/uL (ref 0.0–0.5)
Eosinophils Relative: 3 %
Eosinophils Relative: 3 %
HCT: 39.1 % (ref 39.0–52.0)
HCT: 40.5 % (ref 39.0–52.0)
Hemoglobin: 13 g/dL (ref 13.0–17.0)
Hemoglobin: 13.2 g/dL (ref 13.0–17.0)
Immature Granulocytes: 0 %
Immature Granulocytes: 0 %
Lymphocytes Relative: 45 %
Lymphocytes Relative: 53 %
Lymphs Abs: 1.5 10*3/uL (ref 0.7–4.0)
Lymphs Abs: 1.8 10*3/uL (ref 0.7–4.0)
MCH: 33.6 pg (ref 26.0–34.0)
MCH: 33.8 pg (ref 26.0–34.0)
MCHC: 32.6 g/dL (ref 30.0–36.0)
MCHC: 33.2 g/dL (ref 30.0–36.0)
MCV: 101.6 fL — ABNORMAL HIGH (ref 80.0–100.0)
MCV: 103.1 fL — ABNORMAL HIGH (ref 80.0–100.0)
Monocytes Absolute: 0.2 10*3/uL (ref 0.1–1.0)
Monocytes Absolute: 0.2 10*3/uL (ref 0.1–1.0)
Monocytes Relative: 5 %
Monocytes Relative: 7 %
Neutro Abs: 1.1 10*3/uL — ABNORMAL LOW (ref 1.7–7.7)
Neutro Abs: 1.8 10*3/uL (ref 1.7–7.7)
Neutrophils Relative %: 36 %
Neutrophils Relative %: 46 %
Platelets: 134 10*3/uL — ABNORMAL LOW (ref 150–400)
Platelets: 214 10*3/uL (ref 150–400)
RBC: 3.85 MIL/uL — ABNORMAL LOW (ref 4.22–5.81)
RBC: 3.93 MIL/uL — ABNORMAL LOW (ref 4.22–5.81)
RDW: 13.4 % (ref 11.5–15.5)
RDW: 13.5 % (ref 11.5–15.5)
WBC: 2.9 10*3/uL — ABNORMAL LOW (ref 4.0–10.5)
WBC: 4 10*3/uL (ref 4.0–10.5)
nRBC: 0 % (ref 0.0–0.2)
nRBC: 0 % (ref 0.0–0.2)

## 2022-11-08 LAB — COMPREHENSIVE METABOLIC PANEL
ALT: 36 U/L (ref 0–44)
ALT: 39 U/L (ref 0–44)
AST: 49 U/L — ABNORMAL HIGH (ref 15–41)
AST: 52 U/L — ABNORMAL HIGH (ref 15–41)
Albumin: 3.7 g/dL (ref 3.5–5.0)
Albumin: 3.7 g/dL (ref 3.5–5.0)
Alkaline Phosphatase: 50 U/L (ref 38–126)
Alkaline Phosphatase: 55 U/L (ref 38–126)
Anion gap: 10 (ref 5–15)
Anion gap: 14 (ref 5–15)
BUN: 5 mg/dL — ABNORMAL LOW (ref 6–20)
BUN: <5 mg/dL — ABNORMAL LOW (ref 6–20)
CO2: 23 mmol/L (ref 22–32)
CO2: 27 mmol/L (ref 22–32)
Calcium: 8.2 mg/dL — ABNORMAL LOW (ref 8.9–10.3)
Calcium: 9.1 mg/dL (ref 8.9–10.3)
Chloride: 105 mmol/L (ref 98–111)
Chloride: 105 mmol/L (ref 98–111)
Creatinine, Ser: 0.53 mg/dL — ABNORMAL LOW (ref 0.61–1.24)
Creatinine, Ser: 0.76 mg/dL (ref 0.61–1.24)
GFR, Estimated: >60 mL/min (ref >60)
GFR, Estimated: >60 mL/min (ref >60)
Glucose, Bld: 107 mg/dL — ABNORMAL HIGH (ref 70–99)
Glucose, Bld: 142 mg/dL — ABNORMAL HIGH (ref 70–99)
Potassium: 3.2 mmol/L — ABNORMAL LOW (ref 3.5–5.1)
Potassium: 3.4 mmol/L — ABNORMAL LOW (ref 3.5–5.1)
Sodium: 142 mmol/L (ref 135–145)
Sodium: 142 mmol/L (ref 135–145)
Total Bilirubin: 0.3 mg/dL (ref 0.3–1.2)
Total Bilirubin: 0.5 mg/dL (ref 0.3–1.2)
Total Protein: 6.5 g/dL (ref 6.5–8.1)
Total Protein: 6.7 g/dL (ref 6.5–8.1)

## 2022-11-08 LAB — RAPID URINE DRUG SCREEN, HOSP PERFORMED
Amphetamines: NOT DETECTED
Barbiturates: NOT DETECTED
Benzodiazepines: POSITIVE — AB
Cocaine: NOT DETECTED
Opiates: NOT DETECTED
Tetrahydrocannabinol: NOT DETECTED

## 2022-11-08 LAB — ACETAMINOPHEN LEVEL: Acetaminophen (Tylenol), Serum: <10 ug/mL — ABNORMAL LOW (ref 10–30)

## 2022-11-08 LAB — ETHANOL: Alcohol, Ethyl (B): 446 mg/dL (ref <10)

## 2022-11-08 LAB — SALICYLATE LEVEL: Salicylate Lvl: <7 mg/dL — ABNORMAL LOW (ref 7.0–30.0)

## 2022-11-08 MED ORDER — SODIUM CHLORIDE 0.9 % IV BOLUS
1000.0000 mL | Freq: Once | INTRAVENOUS | Status: AC
  Administered 2022-11-08: 1000 mL via INTRAVENOUS

## 2022-11-08 MED ORDER — LEVETIRACETAM IN NACL 1000 MG/100ML IV SOLN
1000.0000 mg | Freq: Once | INTRAVENOUS | Status: AC
  Administered 2022-11-08: 1000 mg via INTRAVENOUS
  Filled 2022-11-08: qty 100

## 2022-11-08 NOTE — ED Provider Notes (Signed)
Patient was seen by behavioral health NP in the department who has medically cleared patient for discharge.  Patient is pacing the hall asking if he can leave.  He is not suicidal or homicidal, would like to be discharged and declines transport over to behavior health urgent care for further management today.  He is provided with outpatient resources.   Jeannie Fend, PA-C 11/08/22 1008    Wynetta Fines, MD 11/09/22 [phone]

## 2022-11-08 NOTE — ED Notes (Signed)
Pt is refusing to undress or allow care team to perform an EKG at this time

## 2022-11-08 NOTE — ED Triage Notes (Signed)
Pt c/o seizure activity today. EMS reports a bystander called PD and PD called EMS. Pt reports that he drinks all day every day. Pt reports his last drink was today. EMS reports pt was extremely cold when they got to him.

## 2022-11-08 NOTE — ED Notes (Signed)
Able to wake patient with persistent and loud verbal stimuli. He is able to answer questions (states he still feels homicidal intermittently and denies hallucinations at this time "nah, I'm not that far gone.") but quickly returns to sleeping. He is still too inebriated to ambulate.

## 2022-11-08 NOTE — ED Provider Notes (Signed)
[facility]-EMERGENCY DEPT Provider Note   CSN: [id number] Arrival date & time: 11/08/22  2020     History {Add pertinent medical, surgical, social history, OB history to HPI:1} Chief Complaint  Patient presents with   Seizures    Pt c/o seizure activity today. EMS reports a bystander called PD and PD called EMS. Pt reports that he drinks all day every day. Pt reports his last drink was today. EMS reports pt was extremely cold when they got to him.     Gary [name] is a 36 y.o. male.  Patient has a history of alcohol abuse, seizures and HIV.  Patient had a seizure recently.  He states he is taking his Keppra 500 mg twice a day.  The history is provided by the patient and the EMS personnel. No language interpreter was used.  Seizures Seizure activity on arrival: yes   Seizure type:  Grand mal Preceding symptoms: no sensation of an aura present   Initial focality:  None Episode characteristics: no abnormal movements   Postictal symptoms: somnolence   Return to baseline: no   Severity:  Moderate Timing:  Once Progression:  Improving      Home Medications Prior to Admission medications   Medication Sig Start Date End Date Taking? Authorizing Provider  bictegravir-emtricitabine-tenofovir AF (BIKTARVY) 50-200-25 MG TABS tablet Take 1 tablet by mouth daily. 10/23/22   Burnadette Pop, MD  folic acid (FOLVITE) 1 MG tablet Take 1 tablet (1 mg total) by mouth daily. 10/23/22   Burnadette Pop, MD  levETIRAcetam (KEPPRA) 500 MG tablet Take 1 tablet (500 mg total) by mouth 2 (two) times daily. 10/23/22   Burnadette Pop, MD  thiamine (VITAMIN B1) 100 MG tablet Take 1 tablet (100 mg total) by mouth daily. 10/23/22   Burnadette Pop, MD  famotidine (PEPCID) 20 MG tablet Take 1 tablet (20 mg total) by mouth 2 (two) times daily. Patient not taking: Reported on 06/01/2019 05/14/19 06/02/19  Benjiman Core, MD      Allergies    Tegretol [carbamazepine] and  Caffeine    Review of Systems   Review of Systems  Constitutional:  Negative for appetite change and fatigue.  HENT:  Negative for congestion, ear discharge and sinus pressure.   Eyes:  Negative for discharge.  Respiratory:  Negative for cough.   Cardiovascular:  Negative for chest pain.  Gastrointestinal:  Negative for abdominal pain and diarrhea.  Genitourinary:  Negative for frequency and hematuria.  Musculoskeletal:  Negative for back pain.  Skin:  Negative for rash.  Neurological:  Positive for seizures. Negative for headaches.  Psychiatric/Behavioral:  Negative for hallucinations.     Physical Exam Updated Vital Signs BP 111/77   Pulse 76   Temp 97.6 F (36.4 C) (Oral)   Resp 18   Ht 6' (1.829 m)   Wt 68 kg   SpO2 98%   BMI 20.34 kg/m  Physical Exam Vitals and nursing note reviewed.  Constitutional:      Appearance: He is well-developed.  HENT:     Head: Normocephalic.     Nose: Nose normal.  Eyes:     General: No scleral icterus.    Conjunctiva/sclera: Conjunctivae normal.  Neck:     Thyroid: No thyromegaly.  Cardiovascular:     Rate and Rhythm: Normal rate and regular rhythm.     Heart sounds: No murmur heard.    No friction rub. No gallop.  Pulmonary:     Breath sounds: No stridor. No  wheezing or rales.  Chest:     Chest wall: No tenderness.  Abdominal:     General: There is no distension.     Tenderness: There is no abdominal tenderness. There is no rebound.  Musculoskeletal:        General: Normal range of motion.     Cervical back: Neck supple.  Lymphadenopathy:     Cervical: No cervical adenopathy.  Skin:    Findings: No erythema or rash.  Neurological:     Mental Status: He is alert and oriented to person, place, and time.     Motor: No abnormal muscle tone.     Coordination: Coordination normal.  Psychiatric:        Behavior: Behavior normal.     ED Results / Procedures / Treatments   Labs (all labs ordered are listed, but only  abnormal results are displayed) Labs Reviewed  CBC WITH DIFFERENTIAL/PLATELET - Abnormal; Notable for the following components:      Result Value   WBC 2.9 (*)    RBC 3.85 (*)    MCV 101.6 (*)    Platelets 134 (*)    Neutro Abs 1.1 (*)    All other components within normal limits  COMPREHENSIVE METABOLIC PANEL - Abnormal; Notable for the following components:   Potassium 3.2 (*)    Glucose, Bld 107 (*)    BUN 5 (*)    Creatinine, Ser 0.53 (*)    Calcium 8.2 (*)    AST 49 (*)    All other components within normal limits  RAPID URINE DRUG SCREEN, HOSP PERFORMED - Abnormal; Notable for the following components:   Benzodiazepines POSITIVE (*)    All other components within normal limits  ETHANOL    EKG None  Radiology CT Head Wo Contrast  Result Date: 11/08/2022 CLINICAL DATA:  Neuro deficit, acute, stroke suspected EXAM: CT HEAD WITHOUT CONTRAST TECHNIQUE: Contiguous axial images were obtained from the base of the skull through the vertex without intravenous contrast. RADIATION DOSE REDUCTION: This exam was performed according to the departmental dose-optimization program which includes automated exposure control, adjustment of the mA and/or kV according to patient size and/or use of iterative reconstruction technique. COMPARISON:  08/14/2022. FINDINGS: Brain: No evidence of acute infarction, hemorrhage, hydrocephalus, extra-axial collection or mass lesion/mass effect. Ventricles, sulci and cisterns are prominent consistent with involutional changes that are advanced for age. Vascular: No hyperdense vessel or unexpected calcification. Skull: Normal. Negative for fracture or focal lesion. Sinuses/Orbits: No acute finding. IMPRESSION: Involutional changes, advanced for age. No acute intracranial process. Electronically Signed   By: Layla Maw M.D.   On: 11/08/2022 22:48   DG Chest Port 1 View  Result Date: 11/08/2022 CLINICAL DATA:  Weakness, seizures. EXAM: PORTABLE CHEST 1 VIEW  COMPARISON:  CT abdomen pelvis dated September 16, 2022 and chest radiograph dated August 14, 2022. FINDINGS: The heart is normal in size. Right lung is clear. Chronic elevation of the left hemidiaphragm with left basilar opacity suggesting atelectasis or infiltrate. No acute osseous abnormality. IMPRESSION: Chronic elevation of the left hemidiaphragm with left basilar opacity suggesting atelectasis or infiltrate. Electronically Signed   By: Larose Hires D.O.   On: 11/08/2022 22:01    Procedures Procedures  {Document cardiac monitor, telemetry assessment procedure when appropriate:1}  Medications Ordered in ED Medications  levETIRAcetam (KEPPRA) IVPB 1000 mg/100 mL premix (has no administration in time range)  sodium chloride 0.9 % bolus 1,000 mL (1,000 mLs Intravenous New Bag/Given 11/08/22 2201)  ED Course/ Medical Decision Making/ A&P         Patient with a seizure and mild lethargy.                  Medical Decision Making Amount and/or Complexity of Data Reviewed Labs: ordered. Radiology: ordered. ECG/medicine tests: ordered.  Risk Prescription drug management.    {Document critical care time when appropriate:1} {Document review of labs and clinical decision tools ie heart score, Chads2Vasc2 etc:1}  {Document your independent review of radiology images, and any outside records:1} {Document your discussion with family members, caretakers, and with consultants:1} {Document social determinants of health affecting pt's care:1} {Document your decision making why or why not admission, treatments were needed:1} Final Clinical Impression(s) / ED Diagnoses Final diagnoses:  None    Rx / DC Orders ED Discharge Orders     None

## 2022-11-08 NOTE — Discharge Instructions (Addendum)
Take your Keppra as prescribed.

## 2022-11-09 LAB — ETHANOL: Alcohol, Ethyl (B): 393 mg/dL (ref <10)

## 2022-11-09 NOTE — ED Notes (Signed)
Date and time results received: 11/09/22 12:18 AM  (use smartphrase ".now" to insert current time)  Test: ETOH Critical Value: 393  Name of Provider Notified: [facility]   Orders Received? Or Actions Taken?: Orders Received - See Orders for details

## 2022-11-09 NOTE — ED Provider Notes (Signed)
I assumed care of this patient.  Please see previous provider note for further details of Hx, PE.  Briefly patient is a 37 y.o. male who presented for possible szr. Appears intoxicated.  Pending ETOH and MTF.  ETOH >390 Allowed to MTF.  6:44 AM Clinically sober. Ambulated well.  The patient appears reasonably screened and/or stabilized for discharge and I doubt any other medical condition or other [facility] requiring further screening, evaluation, or treatment in the ED at this time. I have discussed the findings, Dx and Tx plan with the patient/family who expressed understanding and agree(s) with the plan. Discharge instructions discussed at length. The patient/family was given strict return precautions who verbalized understanding of the instructions. No further questions at time of discharge.  Disposition: Discharge  Condition: Good  ED Discharge Orders     None        Follow Up: Primary care provider  Call  to schedule an appointment for close follow up          Sebastian Lurz, Grayce Sessions, MD 11/09/22 [phone]

## 2022-11-09 NOTE — ED Notes (Signed)
Pt ambulated in hallway to bathroom, no problems noted.

## 2022-11-12 ENCOUNTER — Emergency Department (HOSPITAL_BASED_OUTPATIENT_CLINIC_OR_DEPARTMENT_OTHER)
Admission: EM | Admit: 2022-11-12 | Discharge: 2022-11-12 | Disposition: A | Discharge status: Home / Self Care | Payer: Medicaid Other | Attending: Emergency Medicine | Admitting: Emergency Medicine

## 2022-11-12 ENCOUNTER — Encounter (HOSPITAL_BASED_OUTPATIENT_CLINIC_OR_DEPARTMENT_OTHER): Payer: Self-pay

## 2022-11-12 DIAGNOSIS — F10920 Alcohol use, unspecified with intoxication, uncomplicated: Secondary | ICD-10-CM | POA: Insufficient documentation

## 2022-11-12 LAB — CBC WITH DIFFERENTIAL/PLATELET
Abs Immature Granulocytes: 0.01 10*3/uL (ref 0.00–0.07)
Basophils Absolute: 0.1 10*3/uL (ref 0.0–0.1)
Basophils Relative: 1 %
Eosinophils Absolute: 0.1 10*3/uL (ref 0.0–0.5)
Eosinophils Relative: 1 %
HCT: 43.1 % (ref 39.0–52.0)
Hemoglobin: 14.6 g/dL (ref 13.0–17.0)
Immature Granulocytes: 0 %
Lymphocytes Relative: 30 %
Lymphs Abs: 1.7 10*3/uL (ref 0.7–4.0)
MCH: 33.3 pg (ref 26.0–34.0)
MCHC: 33.9 g/dL (ref 30.0–36.0)
MCV: 98.4 fL (ref 80.0–100.0)
Monocytes Absolute: 0.3 10*3/uL (ref 0.1–1.0)
Monocytes Relative: 6 %
Neutro Abs: 3.5 10*3/uL (ref 1.7–7.7)
Neutrophils Relative %: 62 %
Platelets: 147 10*3/uL — ABNORMAL LOW (ref 150–400)
RBC: 4.38 MIL/uL (ref 4.22–5.81)
RDW: 13.8 % (ref 11.5–15.5)
WBC: 5.7 10*3/uL (ref 4.0–10.5)
nRBC: 0 % (ref 0.0–0.2)

## 2022-11-12 LAB — COMPREHENSIVE METABOLIC PANEL
ALT: 43 U/L (ref 0–44)
AST: 70 U/L — ABNORMAL HIGH (ref 15–41)
Albumin: 4.3 g/dL (ref 3.5–5.0)
Alkaline Phosphatase: 77 U/L (ref 38–126)
Anion gap: 15 (ref 5–15)
BUN: 9 mg/dL (ref 6–20)
CO2: 26 mmol/L (ref 22–32)
Calcium: 9.2 mg/dL (ref 8.9–10.3)
Chloride: 100 mmol/L (ref 98–111)
Creatinine, Ser: 0.55 mg/dL — ABNORMAL LOW (ref 0.61–1.24)
GFR, Estimated: >60 mL/min (ref >60)
Glucose, Bld: 85 mg/dL (ref 70–99)
Potassium: 3.4 mmol/L — ABNORMAL LOW (ref 3.5–5.1)
Sodium: 141 mmol/L (ref 135–145)
Total Bilirubin: 0.4 mg/dL (ref 0.3–1.2)
Total Protein: 7.8 g/dL (ref 6.5–8.1)

## 2022-11-12 LAB — ETHANOL: Alcohol, Ethyl (B): 457 mg/dL (ref <10)

## 2022-11-12 MED ORDER — SODIUM CHLORIDE 0.9 % IV BOLUS
1000.0000 mL | Freq: Once | INTRAVENOUS | Status: AC
  Administered 2022-11-12: 1000 mL via INTRAVENOUS

## 2022-11-12 MED ORDER — ONDANSETRON HCL 4 MG/2ML IJ SOLN
4.0000 mg | Freq: Once | INTRAMUSCULAR | Status: AC
  Administered 2022-11-12: 4 mg via INTRAVENOUS
  Filled 2022-11-12: qty 2

## 2022-11-12 NOTE — ED Notes (Signed)
Patient refusing to ambulate with this RN. Encouraged patient to walk to facilitate discharge. MD made aware.

## 2022-11-12 NOTE — ED Triage Notes (Signed)
Pt via [facility] for intoxication from park on [facility]. No meds PTA. Pt endorses hx of epilepsy, DTs  Endorses half gallon of vodka/day, states he is scared for his life. Last seizure day before yesterday

## 2022-11-12 NOTE — ED Provider Notes (Signed)
[facility] EMERGENCY DEPT Provider Note   CSN: [id number] Arrival date & time: 11/12/22  0006     History  Chief Complaint  Patient presents with   Alcohol Intoxication   Level 5 caveat due to altered mental status LONN IM is a 37 y.o. male.   Alcohol Intoxication   Patient presents via EMS for alcohol intoxication.  He was found in a park.  Patient reports drinking large quantity of vodka today.  Reports his last seizure was over 2 days ago.    Home Medications Prior to Admission medications   Medication Sig Start Date End Date Taking? Authorizing Provider  bictegravir-emtricitabine-tenofovir AF (BIKTARVY) 50-200-25 MG TABS tablet Take 1 tablet by mouth daily. 10/23/22   Shelly Coss, MD  folic acid (FOLVITE) 1 MG tablet Take 1 tablet (1 mg total) by mouth daily. 10/23/22   Shelly Coss, MD  levETIRAcetam (KEPPRA) 500 MG tablet Take 1 tablet (500 mg total) by mouth 2 (two) times daily. 10/23/22   Shelly Coss, MD  thiamine (VITAMIN B1) 100 MG tablet Take 1 tablet (100 mg total) by mouth daily. 10/23/22   Shelly Coss, MD  famotidine (PEPCID) 20 MG tablet Take 1 tablet (20 mg total) by mouth 2 (two) times daily. Patient not taking: Reported on 06/01/2019 05/14/19 06/02/19  Davonna Belling, MD      Allergies    Tegretol [carbamazepine] and Caffeine    Review of Systems   Review of Systems  Unable to perform ROS: Mental status change    Physical Exam Updated Vital Signs BP 109/64   Pulse (!) 105   Temp 98.1 F (36.7 C) (Oral)   Resp 18   SpO2 96%  Physical Exam CONSTITUTIONAL: Disheveled, resting comfortably, appears intoxicated HEAD: Normocephalic/atraumatic, no visible trauma EYES: EOMI/PERRL ENMT: Mucous membranes moist NECK: supple no meningeal signs CV: S1/S2 noted, no murmurs/rubs/gallops noted LUNGS: Lungs are clear to auscultation bilaterally, no apparent distress ABDOMEN: soft, nontender, no bruising NEURO: Pt is  somnolent but easily arousable.  He follows commands and moves all extremities x 4 EXTREMITIES: pulses normal/equal, full ROM, no signs of trauma SKIN: warm, color normal  ED Results / Procedures / Treatments   Labs (all labs ordered are listed, but only abnormal results are displayed) Labs Reviewed  CBC WITH DIFFERENTIAL/PLATELET - Abnormal; Notable for the following components:      Result Value   Platelets 147 (*)    All other components within normal limits  COMPREHENSIVE METABOLIC PANEL - Abnormal; Notable for the following components:   Potassium 3.4 (*)    Creatinine, Ser 0.55 (*)    AST 70 (*)    All other components within normal limits  ETHANOL - Abnormal; Notable for the following components:   Alcohol, Ethyl (B) 457 (*)    All other components within normal limits    EKG None  Radiology No results found.  Procedures Procedures    Medications Ordered in ED Medications  sodium chloride 0.9 % bolus 1,000 mL (0 mLs Intravenous Stopped 11/12/22 0532)  ondansetron (ZOFRAN) injection 4 mg (4 mg Intravenous Given 11/12/22 0532)    ED Course/ Medical Decision Making/ A&P Clinical Course as of 11/12/22 0631  Thu Nov 12, 2022  0346 Patient well-known to the emergency department has multiple ER visits.  He typically presents intoxicated.  Patient is clearly intoxicated this time, but is easily arousable in no acute distress.  He is sleeping comfortably.  Will monitor in the ED [DW]  0530 Patient  resting comfortably, now reported nausea.  Will give Zofran and fluids [DW]  0630 Patient awake alert and mentating appropriately.  He is in no acute distress.  No signs of significant withdrawal.  Patient will be discharged w/referral to behavioral health urgent care [DW]    Clinical Course User Index [DW] Ripley Fraise, MD                           Medical Decision Making Amount and/or Complexity of Data Reviewed Labs: ordered.  Risk Prescription drug  management.           Final Clinical Impression(s) / ED Diagnoses Final diagnoses:  Alcoholic intoxication without complication [facility])    Rx / DC Orders ED Discharge Orders     None         Ripley Fraise, MD 11/12/22 [phone]

## 2022-11-12 NOTE — ED Notes (Signed)
Instructed patient that a rectal temperature is the most accurate and the best way to obtain a temperature when patient is cold. Patient states "You ain't sticking anything in my ass, put it in my mouth"

## 2022-11-14 ENCOUNTER — Other Ambulatory Visit: Payer: Self-pay

## 2022-11-14 ENCOUNTER — Emergency Department (HOSPITAL_COMMUNITY)
Admission: EM | Admit: 2022-11-14 | Discharge: 2022-11-15 | Disposition: A | Discharge status: Home / Self Care | Payer: Medicaid Other | Attending: Emergency Medicine | Admitting: Emergency Medicine

## 2022-11-14 ENCOUNTER — Encounter (HOSPITAL_COMMUNITY): Payer: Self-pay | Admitting: Emergency Medicine

## 2022-11-14 DIAGNOSIS — F1020 Alcohol dependence, uncomplicated: Secondary | ICD-10-CM

## 2022-11-14 DIAGNOSIS — D696 Thrombocytopenia, unspecified: Secondary | ICD-10-CM | POA: Diagnosis not present

## 2022-11-14 DIAGNOSIS — F10129 Alcohol abuse with intoxication, unspecified: Secondary | ICD-10-CM | POA: Insufficient documentation

## 2022-11-14 DIAGNOSIS — F10920 Alcohol use, unspecified with intoxication, uncomplicated: Secondary | ICD-10-CM

## 2022-11-14 LAB — ETHANOL: Alcohol, Ethyl (B): 477 mg/dL (ref <10)

## 2022-11-14 LAB — RAPID URINE DRUG SCREEN, HOSP PERFORMED
Amphetamines: NOT DETECTED
Barbiturates: NOT DETECTED
Benzodiazepines: NOT DETECTED
Cocaine: NOT DETECTED
Opiates: NOT DETECTED
Tetrahydrocannabinol: NOT DETECTED

## 2022-11-14 LAB — CBG MONITORING, ED: Glucose-Capillary: 113 mg/dL — ABNORMAL HIGH (ref 70–99)

## 2022-11-14 LAB — CBC
HCT: 37.1 % — ABNORMAL LOW (ref 39.0–52.0)
Hemoglobin: 12.5 g/dL — ABNORMAL LOW (ref 13.0–17.0)
MCH: 33.9 pg (ref 26.0–34.0)
MCHC: 33.7 g/dL (ref 30.0–36.0)
MCV: 100.5 fL — ABNORMAL HIGH (ref 80.0–100.0)
Platelets: 111 10*3/uL — ABNORMAL LOW (ref 150–400)
RBC: 3.69 MIL/uL — ABNORMAL LOW (ref 4.22–5.81)
RDW: 13.9 % (ref 11.5–15.5)
WBC: 3.7 10*3/uL — ABNORMAL LOW (ref 4.0–10.5)
nRBC: 0 % (ref 0.0–0.2)

## 2022-11-14 MED ORDER — LACTATED RINGERS IV BOLUS
1000.0000 mL | Freq: Once | INTRAVENOUS | Status: AC
  Administered 2022-11-14: 1000 mL via INTRAVENOUS

## 2022-11-14 NOTE — Discharge Instructions (Addendum)
It was our pleasure to provide your ER care today - we hope that you feel better.  Avoid alcohol use as it is harmful to your physical health and mental well-being. See resource guide attached in terms of accessing inpatient or outpatient substance use treatment programs.   Follow up closely with primary care doctor and behavioral health provider in the coming week.  For mental health issues and/or crisis, you may also go directly to the Behavioral Health Urgent Care Center - they are open 24/7 and walk-ins are welcome.    Return to ER if worse, new symptoms, fevers, chest pain, trouble breathing, or other emergency concern.   

## 2022-11-14 NOTE — ED Provider Notes (Signed)
[facility]  HOSPITAL-EMERGENCY DEPT Provider Note   CSN: [id number] Arrival date & time: 11/14/22  1900     History  Chief Complaint  Patient presents with   Alcohol Intoxication    Gary [name] is a 37 y.o. male.  Pt with hx etoh use disorder, c/o alcohol intoxication. No seizure activity witnessed. EMS brought from [facility], pt in intoxicated state. Pt unable to quantify amount. Denies other drug use. Denies trauma/fall. No headache. No neck or back pain. Pt denies any specific physical c/o. Pt limited historian, poorly cooperative - level 5 caveat.   The history is provided by the patient, medical records and the EMS personnel. The history is limited by the condition of the patient.  Seizures      Home Medications Prior to Admission medications   Medication Sig Start Date End Date Taking? Authorizing Provider  bictegravir-emtricitabine-tenofovir AF (BIKTARVY) 50-200-25 MG TABS tablet Take 1 tablet by mouth daily. 10/23/22   Burnadette Pop, MD  folic acid (FOLVITE) 1 MG tablet Take 1 tablet (1 mg total) by mouth daily. 10/23/22   Burnadette Pop, MD  levETIRAcetam (KEPPRA) 500 MG tablet Take 1 tablet (500 mg total) by mouth 2 (two) times daily. 10/23/22   Burnadette Pop, MD  thiamine (VITAMIN B1) 100 MG tablet Take 1 tablet (100 mg total) by mouth daily. 10/23/22   Burnadette Pop, MD  famotidine (PEPCID) 20 MG tablet Take 1 tablet (20 mg total) by mouth 2 (two) times daily. Patient not taking: Reported on 06/01/2019 05/14/19 06/02/19  Benjiman Core, MD      Allergies    Tegretol [carbamazepine] and Caffeine    Review of Systems   Review of Systems  Constitutional:  Negative for fever.  Respiratory:  Negative for shortness of breath.   Cardiovascular:  Negative for chest pain.  Gastrointestinal:  Negative for abdominal pain.  Neurological:  Negative for headaches.    Physical Exam Updated Vital Signs BP (!) 93/53   Pulse (!) 110   Resp 16   Ht 1.829 m  (6')   Wt 68 kg   SpO2 (!) 88%   BMI 20.33 kg/m  Physical Exam Vitals and nursing note reviewed.  Constitutional:      Appearance: Normal appearance. He is well-developed.  HENT:     Head: Atraumatic.     Nose: Nose normal.     Mouth/Throat:     Mouth: Mucous membranes are moist.     Pharynx: Oropharynx is clear.  Eyes:     General: No scleral icterus.    Conjunctiva/sclera: Conjunctivae normal.     Pupils: Pupils are equal, round, and reactive to light.  Neck:     Trachea: No tracheal deviation.  Cardiovascular:     Rate and Rhythm: Normal rate and regular rhythm.     Pulses: Normal pulses.     Heart sounds: Normal heart sounds. No murmur heard.    No friction rub. No gallop.  Pulmonary:     Effort: Pulmonary effort is normal. No accessory muscle usage or respiratory distress.     Breath sounds: Normal breath sounds.  Abdominal:     General: There is no distension.     Palpations: Abdomen is soft.     Tenderness: There is no abdominal tenderness.  Musculoskeletal:        General: No swelling.     Cervical back: Normal range of motion and neck supple. No rigidity or tenderness.     Comments: CTLS spine, non tender,  aligned, no step off.  Good rom bil extremities without pain or focal bony tenderness.   Skin:    General: Skin is warm and dry.     Findings: No rash.  Neurological:     Mental Status: He is alert.     Comments: Alert, speech clear. Motor/sens grossly intact bil.   Psychiatric:        Mood and Affect: Mood normal.     ED Results / Procedures / Treatments   Labs (all labs ordered are listed, but only abnormal results are displayed) Results for orders placed or performed during the hospital encounter of 11/14/22  Rapid urine drug screen (hospital performed)  Result Value Ref Range   Opiates NONE DETECTED NONE DETECTED   Cocaine NONE DETECTED NONE DETECTED   Benzodiazepines NONE DETECTED NONE DETECTED   Amphetamines NONE DETECTED NONE DETECTED    Tetrahydrocannabinol NONE DETECTED NONE DETECTED   Barbiturates NONE DETECTED NONE DETECTED  Ethanol  Result Value Ref Range   Alcohol, Ethyl (B) 477 (HH) <10 mg/dL  CBC  Result Value Ref Range   WBC 3.7 (L) 4.0 - 10.5 K/uL   RBC 3.69 (L) 4.22 - 5.81 MIL/uL   Hemoglobin 12.5 (L) 13.0 - 17.0 g/dL   HCT 37.1 (L) 39.0 - 52.0 %   MCV 100.5 (H) 80.0 - 100.0 fL   MCH 33.9 26.0 - 34.0 pg   MCHC 33.7 30.0 - 36.0 g/dL   RDW 13.9 11.5 - 15.5 %   Platelets 111 (L) 150 - 400 K/uL   nRBC 0.0 0.0 - 0.2 %  POC CBG, ED  Result Value Ref Range   Glucose-Capillary 113 (H) 70 - 99 mg/dL   CT Head Wo Contrast  Result Date: 11/08/2022 CLINICAL DATA:  Neuro deficit, acute, stroke suspected EXAM: CT HEAD WITHOUT CONTRAST TECHNIQUE: Contiguous axial images were obtained from the base of the skull through the vertex without intravenous contrast. RADIATION DOSE REDUCTION: This exam was performed according to the departmental dose-optimization program which includes automated exposure control, adjustment of the mA and/or kV according to patient size and/or use of iterative reconstruction technique. COMPARISON:  08/14/2022. FINDINGS: Brain: No evidence of acute infarction, hemorrhage, hydrocephalus, extra-axial collection or mass lesion/mass effect. Ventricles, sulci and cisterns are prominent consistent with involutional changes that are advanced for age. Vascular: No hyperdense vessel or unexpected calcification. Skull: Normal. Negative for fracture or focal lesion. Sinuses/Orbits: No acute finding. IMPRESSION: Involutional changes, advanced for age. No acute intracranial process. Electronically Signed   By: Sammie Bench M.D.   On: 11/08/2022 22:48   DG Chest Port 1 View  Result Date: 11/08/2022 CLINICAL DATA:  Weakness, seizures. EXAM: PORTABLE CHEST 1 VIEW COMPARISON:  CT abdomen pelvis dated September 16, 2022 and chest radiograph dated August 14, 2022. FINDINGS: The heart is normal in size. Right lung is  clear. Chronic elevation of the left hemidiaphragm with left basilar opacity suggesting atelectasis or infiltrate. No acute osseous abnormality. IMPRESSION: Chronic elevation of the left hemidiaphragm with left basilar opacity suggesting atelectasis or infiltrate. Electronically Signed   By: Keane Police D.O.   On: 11/08/2022 22:01    EKG None  Radiology No results found.  Procedures Procedures    Medications Ordered in ED Medications - No data to display  ED Course/ Medical Decision Making/ A&P                           Medical Decision Making Problems  Addressed: Acute alcoholic intoxication without complication [facility]): acute illness or injury with systemic symptoms that poses a threat to life or bodily functions Chronic alcoholism ([place]): chronic illness or injury with exacerbation, progression, or side effects of treatment that poses a threat to life or bodily functions Thrombocytopenia ([place]): chronic illness or injury  Amount and/or Complexity of Data Reviewed Independent Historian: EMS    Details: hx External Data Reviewed: notes. Labs: ordered. Decision-making details documented in ED Course.  Risk Decision regarding hospitalization.   Iv ns. Continuous pulse ox and cardiac monitoring. Labs ordered/sent.  Differential diagnosis includes etoh intoxication, seizure, hypoglycemia, etc . Dispo decision including potential need for admission considered - will get labs and reassess.   Reviewed nursing notes and prior charts for additional history. External reports reviewed. Additional history from: EMS.   Cardiac monitor: sinus rhythm, rate 90.  Labs reviewed/interpreted by me - etoh 477, very high.   Po fluids/food. Observe in ED/metabolize. Bp soft. No new c/o or pain. Ivf bolus.   2235, glucose normal. Pt alert, conversant, bp improved w ivf. No new c/o. No headache. No cp or sob. No abd pain or nv.    2300 signed out to Dr Florina Ou to check pending labs, recheck pt and  dispo appropriately.          Final Clinical Impression(s) / ED Diagnoses Final diagnoses:  Acute alcoholic intoxication without complication ([place])  Chronic alcoholism ([place])    Rx / DC Orders ED Discharge Orders     None         Lajean Saver, MD 11/14/22 2302

## 2022-11-14 NOTE — ED Triage Notes (Signed)
Today the patient was dropped off at the [facility]. While there, he alerted staff that he felt disoriented. It was believed that he may have had a seizure. He also complains of bilateral knee pain. ETOH on board with limited food. EMS reports that he has a large bottle of alcohol in his pocket that he was unwilling to part with and will fight if someone tries to take it from him.    EMS vitals: 122/86 BP 56 HR 125 CBG 98% SPO2 on room air

## 2022-11-15 ENCOUNTER — Encounter (HOSPITAL_COMMUNITY): Payer: Self-pay

## 2022-11-15 ENCOUNTER — Emergency Department (HOSPITAL_COMMUNITY)
Admission: EM | Admit: 2022-11-15 | Discharge: 2022-11-16 | Disposition: A | Discharge status: Home / Self Care | Payer: Medicaid Other | Attending: Emergency Medicine | Admitting: Emergency Medicine

## 2022-11-15 DIAGNOSIS — F10120 Alcohol abuse with intoxication, uncomplicated: Secondary | ICD-10-CM | POA: Insufficient documentation

## 2022-11-15 DIAGNOSIS — F10920 Alcohol use, unspecified with intoxication, uncomplicated: Secondary | ICD-10-CM

## 2022-11-15 LAB — ETHANOL: Alcohol, Ethyl (B): 408 mg/dL (ref <10)

## 2022-11-15 LAB — LIPASE, BLOOD: Lipase: 101 U/L — ABNORMAL HIGH (ref 11–51)

## 2022-11-15 MED ORDER — LACTATED RINGERS IV BOLUS
1000.0000 mL | Freq: Once | INTRAVENOUS | Status: AC
  Administered 2022-11-15: 1000 mL via INTRAVENOUS

## 2022-11-15 MED ORDER — THIAMINE HCL 100 MG/ML IJ SOLN
100.0000 mg | Freq: Once | INTRAMUSCULAR | Status: AC
  Administered 2022-11-15: 100 mg via INTRAVENOUS
  Filled 2022-11-15: qty 2

## 2022-11-15 NOTE — ED Notes (Signed)
Pt able to walk with steady gait.

## 2022-11-15 NOTE — ED Triage Notes (Signed)
Pt presents with c/o alcohol intoxication. Pt recently left facility with same. Pt reports he talked to [place] and is now ready to stop drinking.

## 2022-11-15 NOTE — ED Provider Notes (Signed)
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  Signed out last night waiting for clinical sobriety. Reported epigastric abdominal pain.  Labs with mildly elevated lipase but not as high as previous or technically consistent with pancreatitis.   Repeat etoh continueds to be elevated.   At time of my assumption of care, admits to continuing to drink etoh while in the ED.  Denies SI/HI.  Discussed if he would like to stop drinking we can provide supportive care, ativan, see if he meets inpatient hospitalization criteria, however if we are planning on starting CIWA he cannot be drinking etoh while in the ED.  He reports some interest in sobriety but is not willing to stop drinking in the ED nor allow [place] to confiscate his etoh.  He is at this time clinically, sober, eating, able to ambulate.    Do not see indication for psychiatric or medical admission at this time in setting of him not showing interest in stopping drinking.      Gareth Morgan, MD 11/15/22 2233

## 2022-11-15 NOTE — ED Provider Notes (Addendum)
6:11 AM Patient now complaining of epigastric pain consistent with previous episodes of pancreatitis.  We will check a lipase level to evaluate for acute pancreatitis and we will recheck his alcohol level as well.  With his degree of intoxication I do not believe narcotics are in his best interests.  7:11 AM Patient's somnolent.  Oxygen saturation 93% on nasal cannula.  Signed out to Dr. Billy Fischer.  Lipase is 101 which is the low end of his usual lipase readings.  Alcohol level is 408 which is still profoundly intoxicated.     Jenisis Harmsen, Jenny Reichmann, MD 11/15/22 3474    Shanon Rosser, MD 11/15/22 2595    Shanon Rosser, MD 11/15/22 [phone]

## 2022-11-15 NOTE — ED Provider Triage Note (Addendum)
Emergency Medicine Provider Triage Evaluation Note  Gary [name] , a 37 y.o. male  was evaluated in triage.  Pt states "I'm here to detox. I talked to Jesus and I'm ready to stop drinking." Multiple recent visits for alcohol intoxication. Has a history of alcohol induced pancreatitis and c/o some epigastric pain and nausea typical for this history. States last EtOH early this morning after being discharged from here. Denies SI or HI.   Review of Systems  Positive: See HPI Negative: See HPI  Physical Exam  BP (!) 116/90 (BP Location: Left Arm)   Pulse (!) 106   Temp 97.9 F (36.6 C) (Oral)   Resp 16   Ht 6' (1.829 m)   Wt 68 kg   SpO2 93%   BMI 20.34 kg/m  Gen:   Awake, no distress, nontoxic appearing  Resp:  Normal effort  MSK:   Moves extremities without difficulty  Other:  Mild epigastric tenderness, no rebound or guarding, slightly slurred speech but alert and oriented and answering questions appropriately, no SI or HI  Medical Decision Making  Medically screening exam initiated at 10:24 PM.  Appropriate orders placed.  IZACC DEMEYER was informed that the remainder of the evaluation will be completed by another provider, this initial triage assessment does not replace that evaluation, and the importance of remaining in the ED until their evaluation is complete.  Labs completed yesterday. Last EtOH level 408 with lipase 101.    Turner Daniels 11/15/22 2225    Suzzette Righter, PA-C 11/15/22 2226

## 2022-11-16 NOTE — ED Provider Notes (Signed)
[facility] DEPT Provider Note   CSN: [id number] Arrival date & time: 11/15/22  2213     History  Chief Complaint  Patient presents with   Alcohol Intoxication    Gary [name] is a 37 y.o. male with a history of chronic recurrent pancreatitis, alcohol abuse, who presents emergency department with concerns for alcohol intoxication.  Notes that he was in the emergency department yesterday however he states that he was kicked out of the emergency department.  Per patient chart review it was noted that patient was found to still be drinking alcohol and refused to stop while in the emergency department.  Patient denies at this time concerns for chest pain, shortness of breath.  The history is provided by the patient. No language interpreter was used.       Home Medications Prior to Admission medications   Medication Sig Start Date End Date Taking? Authorizing Provider  bictegravir-emtricitabine-tenofovir AF (BIKTARVY) 50-200-25 MG TABS tablet Take 1 tablet by mouth daily. 10/23/22   Shelly Coss, MD  folic acid (FOLVITE) 1 MG tablet Take 1 tablet (1 mg total) by mouth daily. 10/23/22   Shelly Coss, MD  levETIRAcetam (KEPPRA) 500 MG tablet Take 1 tablet (500 mg total) by mouth 2 (two) times daily. 10/23/22   Shelly Coss, MD  thiamine (VITAMIN B1) 100 MG tablet Take 1 tablet (100 mg total) by mouth daily. 10/23/22   Shelly Coss, MD  famotidine (PEPCID) 20 MG tablet Take 1 tablet (20 mg total) by mouth 2 (two) times daily. Patient not taking: Reported on 06/01/2019 05/14/19 06/02/19  Davonna Belling, MD      Allergies    Tegretol [carbamazepine] and Caffeine    Review of Systems   Review of Systems  All other systems reviewed and are negative.   Physical Exam Updated Vital Signs BP 123/84 (BP Location: Left Arm)   Pulse 98   Temp 98.4 F (36.9 C) (Oral)   Resp 18   Ht 6' (1.829 m)   Wt 68 kg   SpO2 96%   BMI 20.34 kg/m   Physical Exam Vitals and nursing note reviewed.  Constitutional:      General: He is not in acute distress.    Appearance: Normal appearance.  Eyes:     General: No scleral icterus.    Extraocular Movements: Extraocular movements intact.  Cardiovascular:     Rate and Rhythm: Normal rate and regular rhythm.     Pulses: Normal pulses.     Heart sounds: Normal heart sounds.  Pulmonary:     Effort: Pulmonary effort is normal. No respiratory distress.     Breath sounds: Normal breath sounds.  Abdominal:     Palpations: Abdomen is soft. There is no mass.     Tenderness: There is no abdominal tenderness.  Musculoskeletal:        General: Normal range of motion.     Cervical back: Neck supple.     Comments: Able to ambulate without assistance or difficulty. No tremor noted.   Skin:    General: Skin is warm and dry.     Findings: No rash.  Neurological:     Mental Status: He is alert.     Sensory: Sensation is intact.     Motor: Motor function is intact.  Psychiatric:        Behavior: Behavior normal.     ED Results / Procedures / Treatments   Labs (all labs ordered are listed, but only abnormal  results are displayed) Labs Reviewed - No data to display  EKG None  Radiology No results found.  Procedures Procedures    Medications Ordered in ED Medications - No data to display  ED Course/ Medical Decision Making/ A&P Clinical Course as of 11/16/22 0603  Mon Nov 16, 2022  0327 Reevaluated patient with RN at bedside.  Patient requested to know the time.  Inquired on why patient wanted to know the time.  Patient notes that he is going to go drink alcohol.  Patient without tremors or fasciculations today.  Able to ambulate without assistance or difficulty.  Discussed that we can provide patient with a prescription for Librium, patient notes that it does not work for him.  Patient provided with resources and encouraged to follow-up with them to aid with detox.  Patient appears  safe for discharge. [SB]    Clinical Course User Index [SB] Brylen Wagar A, PA-C                           Medical Decision Making  Pt presents with concerns for alcohol intoxication. Vital signs, patient afebrile, not tachycardic or hypoxic. On exam, pt with no acute cardiovascular, respiratory, abdominal exam findings.    Co morbidities that complicate the patient evaluation: Alcohol abuse, HIV, polysubstance abuse  Additional history obtained:  External records from outside source obtained and reviewed including: Patient was evaluated in the emergency department on 11/15/2022 for similar concerns.  At that time it was found that patient was drinking alcohol in the emergency department and refused to stop drinking.  At that time patient was discharged from the ED with resources.  Disposition: Presenting concerning for alcohol intoxication without complication at this time.  Patient without tremors. After consideration of the diagnostic results and the patients response to treatment, I feel that the patient would benefit from Discharge home.  Patient provided with resources and encouraged to follow-up.  Supportive care measures and strict return precautions discussed with patient at bedside. Pt acknowledges and verbalizes understanding. Pt appears safe for discharge. Follow up as indicated in discharge paperwork.    This chart was dictated using voice recognition software, Dragon. Despite the best efforts of this provider to proofread and correct errors, errors may still occur which can change documentation meaning.  Final Clinical Impression(s) / ED Diagnoses Final diagnoses:  Alcoholic intoxication without complication [facility])    Rx / DC Orders ED Discharge Orders     None         Edelmiro Innocent A, PA-C 11/16/22 0604    M[place], Jonny Ruiz, MD 11/16/22 [phone]

## 2022-11-16 NOTE — Discharge Instructions (Addendum)
Follow up with [facility] (Urgent Care) Follow up with [facility] Follow up with [facility] ([place])  Avoid alcohol use as it is harmful to your physical health and mental well-being. See resource guide attached in terms of accessing inpatient or outpatient substance use treatment programs. Follow up closely with primary care doctor and behavioral health provider in the coming week. For mental health issues and/or crisis, you may also go directly to the [facility] [place] - they are open 24/7 and walk-ins are welcome. Return to ER if worse or new symptoms.

## 2022-11-16 NOTE — ED Notes (Signed)
Provider bedside, pt says he does not want to use referrals given earlier, he only wants medical detox.  Pt is not displaying any signs of withdrawals at this time.

## 2022-11-17 ENCOUNTER — Other Ambulatory Visit: Payer: Self-pay

## 2022-11-17 ENCOUNTER — Emergency Department (HOSPITAL_COMMUNITY)
Admission: EM | Admit: 2022-11-17 | Discharge: 2022-11-17 | Disposition: A | Discharge status: Home / Self Care | Payer: Medicaid Other | Attending: Emergency Medicine | Admitting: Emergency Medicine

## 2022-11-17 DIAGNOSIS — R569 Unspecified convulsions: Secondary | ICD-10-CM | POA: Diagnosis present

## 2022-11-17 DIAGNOSIS — Z1152 Encounter for screening for COVID-19: Secondary | ICD-10-CM | POA: Diagnosis not present

## 2022-11-17 DIAGNOSIS — F10930 Alcohol use, unspecified with withdrawal, uncomplicated: Secondary | ICD-10-CM

## 2022-11-17 DIAGNOSIS — F10230 Alcohol dependence with withdrawal, uncomplicated: Secondary | ICD-10-CM | POA: Diagnosis not present

## 2022-11-17 LAB — CBC WITH DIFFERENTIAL/PLATELET
Abs Immature Granulocytes: 0.01 10*3/uL (ref 0.00–0.07)
Basophils Absolute: 0 10*3/uL (ref 0.0–0.1)
Basophils Relative: 1 %
Eosinophils Absolute: 0 10*3/uL (ref 0.0–0.5)
Eosinophils Relative: 0 %
HCT: 37.6 % — ABNORMAL LOW (ref 39.0–52.0)
Hemoglobin: 12.5 g/dL — ABNORMAL LOW (ref 13.0–17.0)
Immature Granulocytes: 0 %
Lymphocytes Relative: 21 %
Lymphs Abs: 0.8 10*3/uL (ref 0.7–4.0)
MCH: 33.4 pg (ref 26.0–34.0)
MCHC: 33.2 g/dL (ref 30.0–36.0)
MCV: 100.5 fL — ABNORMAL HIGH (ref 80.0–100.0)
Monocytes Absolute: 0.4 10*3/uL (ref 0.1–1.0)
Monocytes Relative: 9 %
Neutro Abs: 2.7 10*3/uL (ref 1.7–7.7)
Neutrophils Relative %: 69 %
Platelets: 79 10*3/uL — ABNORMAL LOW (ref 150–400)
RBC: 3.74 MIL/uL — ABNORMAL LOW (ref 4.22–5.81)
RDW: 14.1 % (ref 11.5–15.5)
WBC: 3.9 10*3/uL — ABNORMAL LOW (ref 4.0–10.5)
nRBC: 0 % (ref 0.0–0.2)

## 2022-11-17 LAB — RESP PANEL BY RT-PCR (RSV, FLU A&B, COVID)  RVPGX2
Influenza A by PCR: NEGATIVE
Influenza B by PCR: NEGATIVE
Resp Syncytial Virus by PCR: NEGATIVE
SARS Coronavirus 2 by RT PCR: NEGATIVE

## 2022-11-17 LAB — BASIC METABOLIC PANEL
Anion gap: 18 — ABNORMAL HIGH (ref 5–15)
BUN: 6 mg/dL (ref 6–20)
CO2: 18 mmol/L — ABNORMAL LOW (ref 22–32)
Calcium: 8.4 mg/dL — ABNORMAL LOW (ref 8.9–10.3)
Chloride: 102 mmol/L (ref 98–111)
Creatinine, Ser: 0.59 mg/dL — ABNORMAL LOW (ref 0.61–1.24)
GFR, Estimated: >60 mL/min (ref >60)
Glucose, Bld: 97 mg/dL (ref 70–99)
Potassium: 4.1 mmol/L (ref 3.5–5.1)
Sodium: 138 mmol/L (ref 135–145)

## 2022-11-17 LAB — ETHANOL: Alcohol, Ethyl (B): 54 mg/dL — ABNORMAL HIGH (ref <10)

## 2022-11-17 LAB — CBG MONITORING, ED: Glucose-Capillary: 88 mg/dL (ref 70–99)

## 2022-11-17 LAB — MAGNESIUM: Magnesium: 1.5 mg/dL — ABNORMAL LOW (ref 1.7–2.4)

## 2022-11-17 MED ORDER — ADULT MULTIVITAMIN W/MINERALS CH
1.0000 | ORAL_TABLET | Freq: Every day | ORAL | Status: DC
  Administered 2022-11-17: 1 via ORAL
  Filled 2022-11-17: qty 1

## 2022-11-17 MED ORDER — FOLIC ACID 1 MG PO TABS
1.0000 mg | ORAL_TABLET | Freq: Every day | ORAL | Status: DC
  Administered 2022-11-17: 1 mg via ORAL
  Filled 2022-11-17: qty 1

## 2022-11-17 MED ORDER — MAGNESIUM OXIDE -MG SUPPLEMENT 400 (240 MG) MG PO TABS
400.0000 mg | ORAL_TABLET | Freq: Once | ORAL | Status: AC
  Administered 2022-11-17: 400 mg via ORAL
  Filled 2022-11-17: qty 1

## 2022-11-17 MED ORDER — LORAZEPAM 2 MG/ML IJ SOLN
1.0000 mg | INTRAMUSCULAR | Status: DC | PRN
  Administered 2022-11-17: 4 mg via INTRAVENOUS
  Administered 2022-11-17: 2 mg via INTRAVENOUS
  Filled 2022-11-17: qty 2
  Filled 2022-11-17: qty 1

## 2022-11-17 MED ORDER — THIAMINE MONONITRATE 100 MG PO TABS
100.0000 mg | ORAL_TABLET | Freq: Every day | ORAL | Status: DC
  Administered 2022-11-17: 100 mg via ORAL
  Filled 2022-11-17: qty 1

## 2022-11-17 MED ORDER — THIAMINE HCL 100 MG/ML IJ SOLN: 100.0000 mg | Freq: Every day | INTRAMUSCULAR | Status: DC

## 2022-11-17 MED ORDER — MAGNESIUM SULFATE 2 GM/50ML IV SOLN
2.0000 g | Freq: Once | INTRAVENOUS | Status: AC
  Administered 2022-11-17: 2 g via INTRAVENOUS
  Filled 2022-11-17: qty 50

## 2022-11-17 MED ORDER — LORAZEPAM 1 MG PO TABS
1.0000 mg | ORAL_TABLET | ORAL | Status: DC | PRN
  Administered 2022-11-17: 2 mg via ORAL
  Administered 2022-11-17: 4 mg via ORAL
  Administered 2022-11-17: 1 mg via ORAL
  Filled 2022-11-17: qty 1
  Filled 2022-11-17: qty 2
  Filled 2022-11-17: qty 4

## 2022-11-17 NOTE — Progress Notes (Signed)
CSW has attached more SUD resources to this patient's AVS.

## 2022-11-17 NOTE — ED Provider Notes (Signed)
[facility]-EMERGENCY DEPT Provider Note   CSN: [id number] Arrival date & time: 11/17/22  1257     History  Chief Complaint  Patient presents with   Seizures    Seizure like activity     Gary [name] is a 37 y.o. male.   Seizures    37 year old male presenting to the emergency department via EMS from [facility] shelter after bystanders found the patient shaking with seizure-like activity.  The patient has a known history of chronic alcohol abuse and alcohol withdrawal seizures.  He was transported to the emergency department where he arrived in acute alcohol withdrawal, back to his baseline mental status with the exception of agitation associated with alcohol withdrawal.  He states that he had less to drink than usual yesterday and thinks this is "why I am so bad off today as I am."  He denies any SI, HI, AVH.  He arrives GCS 15, ABC intact.  Home Medications Prior to Admission medications   Medication Sig Start Date End Date Taking? Authorizing Provider  bictegravir-emtricitabine-tenofovir AF (BIKTARVY) 50-200-25 MG TABS tablet Take 1 tablet by mouth daily. 10/23/22   Burnadette Pop, MD  folic acid (FOLVITE) 1 MG tablet Take 1 tablet (1 mg total) by mouth daily. 10/23/22   Burnadette Pop, MD  levETIRAcetam (KEPPRA) 500 MG tablet Take 1 tablet (500 mg total) by mouth 2 (two) times daily. 10/23/22   Burnadette Pop, MD  thiamine (VITAMIN B1) 100 MG tablet Take 1 tablet (100 mg total) by mouth daily. 10/23/22   Burnadette Pop, MD  famotidine (PEPCID) 20 MG tablet Take 1 tablet (20 mg total) by mouth 2 (two) times daily. Patient not taking: Reported on 06/01/2019 05/14/19 06/02/19  Benjiman Core, MD      Allergies    Tegretol [carbamazepine] and Caffeine    Review of Systems   Review of Systems  Neurological:  Positive for seizures.  All other systems reviewed and are negative.   Physical Exam Updated Vital Signs BP 116/79   Pulse (!) 117   Temp 99.1 F  (37.3 C) (Oral)   Resp 20   Ht 6' (1.829 m)   Wt 68 kg   SpO2 91%   BMI 20.34 kg/m  Physical Exam Vitals and nursing note reviewed.  Constitutional:      Appearance: He is well-developed. He is not toxic-appearing.  HENT:     Head: Normocephalic and atraumatic.  Eyes:     Conjunctiva/sclera: Conjunctivae normal.  Cardiovascular:     Rate and Rhythm: Regular rhythm. Tachycardia present.  Pulmonary:     Effort: Pulmonary effort is normal. No respiratory distress.     Breath sounds: Normal breath sounds.  Abdominal:     Palpations: Abdomen is soft.     Tenderness: There is no abdominal tenderness.  Musculoskeletal:     Cervical back: Neck supple.  Skin:    General: Skin is warm and dry.     Capillary Refill: Capillary refill takes less than 2 seconds.  Neurological:     Mental Status: He is alert.     Comments: MENTAL STATUS EXAM:    Orientation: Alert and oriented to person, place and time.  Memory: Cooperative, follows commands well.  Language: Speech is clear and language is normal.   CRANIAL NERVES:    CN 2 (Optic): Visual fields intact to confrontation.  CN 3,4,6 (EOM): Pupils equal and reactive to light. Full extraocular eye movement without nystagmus.  CN 5 (Trigeminal): Facial sensation is  normal, no weakness of masticatory muscles.  CN 7 (Facial): No facial weakness or asymmetry.  CN 8 (Auditory): Auditory acuity grossly normal.  CN 9,10 (Glossophar): The uvula is midline, the palate elevates symmetrically.  CN 11 (spinal access): Normal sternocleidomastoid and trapezius strength.  CN 12 (Hypoglossal): The tongue is midline. No atrophy or fasciculations.Marland Kitchen   MOTOR:  Muscle Strength: 5/5RUE, 5/5LUE, 5/5RLE, 5/5LLE.   COORDINATION:   Dysmetria present, acutely tremulous.   SENSATION:   Intact to light touch all four extremities.     Psychiatric:        Mood and Affect: Mood normal.     ED Results / Procedures / Treatments   Labs (all labs ordered are  listed, but only abnormal results are displayed) Labs Reviewed  CBC WITH DIFFERENTIAL/PLATELET - Abnormal; Notable for the following components:      Result Value   WBC 3.9 (*)    RBC 3.74 (*)    Hemoglobin 12.5 (*)    HCT 37.6 (*)    MCV 100.5 (*)    Platelets 79 (*)    All other components within normal limits  BASIC METABOLIC PANEL - Abnormal; Notable for the following components:   CO2 18 (*)    Creatinine, Ser 0.59 (*)    Calcium 8.4 (*)    Anion gap 18 (*)    All other components within normal limits  MAGNESIUM - Abnormal; Notable for the following components:   Magnesium 1.5 (*)    All other components within normal limits  ETHANOL - Abnormal; Notable for the following components:   Alcohol, Ethyl (B) 54 (*)    All other components within normal limits  RESP PANEL BY RT-PCR (RSV, FLU A&B, COVID)  RVPGX2  CBG MONITORING, ED    EKG None  Radiology No results found.  Procedures Procedures    Medications Ordered in ED Medications  LORazepam (ATIVAN) tablet 1-4 mg ( Oral See Alternative 11/17/22 1518)    Or  LORazepam (ATIVAN) injection 1-4 mg (4 mg Intravenous Given 11/17/22 1518)  thiamine (VITAMIN B1) tablet 100 mg (100 mg Oral Given 11/17/22 1413)    Or  thiamine (VITAMIN B1) injection 100 mg ( Intravenous See Alternative 11/14/08 9604)  folic acid (FOLVITE) tablet 1 mg (1 mg Oral Given 11/17/22 1413)  multivitamin with minerals tablet 1 tablet (1 tablet Oral Given 11/17/22 1413)  magnesium sulfate IVPB 2 g 50 mL (2 g Intravenous New Bag/Given 11/17/22 1458)  magnesium oxide (MAG-OX) tablet 400 mg (400 mg Oral Given 11/17/22 1544)    ED Course/ Medical Decision Making/ A&P Clinical Course as of 11/17/22 1639  Tue Nov 17, 2022  1559 Stable ETOH withdrawals. CIWA 22 Reassess if he wants to be admitted  [CC]    Clinical Course User Index [CC] Tretha Sciara, MD                           Medical Decision Making Amount and/or Complexity of Data Reviewed Labs:  ordered.  Risk OTC drugs. Prescription drug management.     37 year old male presenting to the emergency department via EMS from [place] after bystanders found the patient shaking with seizure-like activity.  The patient has a known history of chronic alcohol abuse and alcohol withdrawal seizures.  He was transported to the emergency department where he arrived in acute alcohol withdrawal, back to his baseline mental status with the exception of agitation associated with alcohol withdrawal.  He states  that he had less to drink than usual yesterday and thinks this is "why I am so bad off today as I am."  He denies any SI, HI, AVH.  He arrives GCS 15, ABC intact.  On arrival, the patient was vitally stable, tachycardic.  CIWA initially of 22 on arrival.  Patient was placed on CIWA protocol.  Greening laboratory evaluation performed to include CBC with a leukopenia and mild anemia to 12.5, BMP with a mild anion gap acidosis with a bicarbonate of 18, anion gap of 18, suspect component of starvation ketosis in addition to alcohol use.  Additionally considered lactic acidosis in the setting of the patient's alcohol withdrawal seizure.  He has a known history of alcohol withdrawal and seizures.  His initial blood glucose on arrival was 88.  Ethanol level was 54 prior to magnesium was 1.5.  His magnesium was replenished orally and IV.  COVID-19 influenza and RSV PCR testing was collected and pending.  The patient does endorse a desire to quit drinking.  Plan at time of signout to reassess the patient pending replenishment of his magnesium, reassess the patient's CIWA following CIWA protocol Ativan administration.  Consideration for admission pending repeat assessment.  Signout given to Dr. Doran Durand at [phone].  Final Clinical Impression(s) / ED Diagnoses Final diagnoses:  Seizure-like activity (HCC)  Alcohol withdrawal syndrome without complication [facility])    Rx / DC Orders ED Discharge Orders     None          Ernie Avena, MD 11/17/22 1639

## 2022-11-17 NOTE — ED Provider Notes (Signed)
Clinical Course as of 11/17/22 1602  Tue Nov 17, 2022  1559 Stable ETOH withdrawals. CIWA 17 Reassess if he wants to be admitted  [CC]    Clinical Course User Index [CC] Tretha Sciara, MD   Reevaluated patient at bedside.  He is hemodynamically stable in no acute distress. He has been observed in the emergency department for 9 hours since his seizure-like episode earlier today.  Had a prolonged conversation with patient.  He does not intend to quit drinking.  His CIWA's have dropped to 6 and 2 respectively over the last 2 measurements and he does not require further treatment at this time.  Since he intends to continue drinking, does not make much sense to have him admitted medically for alcohol withdrawals.  Patient would like to be DC to OP care and management.   Tretha Sciara, MD 11/17/22 2213

## 2022-11-17 NOTE — ED Triage Notes (Signed)
Patient brought in by EMS from [place], bystanders saw patient shaking and called EMS for seizure like activity. 122/74 104 96%

## 2022-11-23 ENCOUNTER — Other Ambulatory Visit: Payer: Self-pay

## 2022-11-23 ENCOUNTER — Emergency Department (HOSPITAL_COMMUNITY): Payer: Medicaid Other

## 2022-11-23 ENCOUNTER — Emergency Department (HOSPITAL_COMMUNITY)
Admission: EM | Admit: 2022-11-23 | Discharge: 2022-11-24 | Disposition: A | Discharge status: Home / Self Care | Payer: Medicaid Other | Attending: Emergency Medicine | Admitting: Emergency Medicine

## 2022-11-23 ENCOUNTER — Encounter (HOSPITAL_COMMUNITY): Payer: Self-pay | Admitting: Emergency Medicine

## 2022-11-23 DIAGNOSIS — F1014 Alcohol abuse with alcohol-induced mood disorder: Secondary | ICD-10-CM | POA: Insufficient documentation

## 2022-11-23 DIAGNOSIS — F10930 Alcohol use, unspecified with withdrawal, uncomplicated: Secondary | ICD-10-CM

## 2022-11-23 DIAGNOSIS — F10230 Alcohol dependence with withdrawal, uncomplicated: Secondary | ICD-10-CM | POA: Diagnosis not present

## 2022-11-23 DIAGNOSIS — F1020 Alcohol dependence, uncomplicated: Secondary | ICD-10-CM | POA: Insufficient documentation

## 2022-11-23 DIAGNOSIS — F1090 Alcohol use, unspecified, uncomplicated: Secondary | ICD-10-CM

## 2022-11-23 DIAGNOSIS — E876 Hypokalemia: Secondary | ICD-10-CM | POA: Insufficient documentation

## 2022-11-23 DIAGNOSIS — F1094 Alcohol use, unspecified with alcohol-induced mood disorder: Secondary | ICD-10-CM

## 2022-11-23 NOTE — ED Triage Notes (Signed)
Pt bib [facility] after a fall, lac to R wrist. C/o of suicidal ideations. Etoh on board. Hx of alcoholism.  BP 200/100, HR 94, Spo2 98%

## 2022-11-23 NOTE — ED Provider Triage Note (Cosign Needed Addendum)
Emergency Medicine Provider Triage Evaluation Note  Gary [name] , a 37 y.o. male  was evaluated in triage.  Pt complains of being worried about etoh withdrawal. His last drink was earlier today. States he has etoh on him now.   He's interested in rehab.   Not suicidal. He states he has no intention of hurting himself or anyone else.   Review of Systems  Positive: ETOH use Negative: Fever   Physical Exam  BP 115/87 (BP Location: Left Wrist)   Pulse (!) 103   Temp 98.8 F (37.1 C) (Oral)   Resp 18   Ht 6' (1.829 m)   Wt 68 kg   SpO2 94%   BMI 20.34 kg/m  Gen:   Awake, no distress   Resp:  Normal effort  MSK:   Moves extremities without difficulty  Other:  Visibly intoxicated No evidence of head trauma.  Injury to R hand  Medical Decision Making  Medically screening exam initiated at 11:38 PM.  Appropriate orders placed.  GARLAND HINCAPIE was informed that the remainder of the evaluation will be completed by another provider, this initial triage assessment does not replace that evaluation, and the importance of remaining in the ED until their evaluation is complete.  Basic labs.  No head injuries per pt.    Tedd Sias, [place] 11/23/22 2341    Tedd Sias, [place] 11/23/22 [phone]

## 2022-11-24 ENCOUNTER — Emergency Department (HOSPITAL_COMMUNITY): Payer: Medicaid Other

## 2022-11-24 ENCOUNTER — Encounter (HOSPITAL_COMMUNITY): Payer: Self-pay | Admitting: Emergency Medicine

## 2022-11-24 ENCOUNTER — Other Ambulatory Visit (HOSPITAL_COMMUNITY): Payer: Self-pay

## 2022-11-24 LAB — CBC
HCT: 36.1 % — ABNORMAL LOW (ref 39.0–52.0)
Hemoglobin: 12.6 g/dL — ABNORMAL LOW (ref 13.0–17.0)
MCH: 34.5 pg — ABNORMAL HIGH (ref 26.0–34.0)
MCHC: 34.9 g/dL (ref 30.0–36.0)
MCV: 98.9 fL (ref 80.0–100.0)
Platelets: 166 10*3/uL (ref 150–400)
RBC: 3.65 MIL/uL — ABNORMAL LOW (ref 4.22–5.81)
RDW: 14.3 % (ref 11.5–15.5)
WBC: 3 10*3/uL — ABNORMAL LOW (ref 4.0–10.5)
nRBC: 0 % (ref 0.0–0.2)

## 2022-11-24 LAB — COMPREHENSIVE METABOLIC PANEL
ALT: 56 U/L — ABNORMAL HIGH (ref 0–44)
AST: 89 U/L — ABNORMAL HIGH (ref 15–41)
Albumin: 3.5 g/dL (ref 3.5–5.0)
Alkaline Phosphatase: 69 U/L (ref 38–126)
Anion gap: 13 (ref 5–15)
BUN: <5 mg/dL — ABNORMAL LOW (ref 6–20)
CO2: 21 mmol/L — ABNORMAL LOW (ref 22–32)
Calcium: 8 mg/dL — ABNORMAL LOW (ref 8.9–10.3)
Chloride: 99 mmol/L (ref 98–111)
Creatinine, Ser: 0.6 mg/dL — ABNORMAL LOW (ref 0.61–1.24)
GFR, Estimated: >60 mL/min (ref >60)
Glucose, Bld: 107 mg/dL — ABNORMAL HIGH (ref 70–99)
Potassium: 3.1 mmol/L — ABNORMAL LOW (ref 3.5–5.1)
Sodium: 133 mmol/L — ABNORMAL LOW (ref 135–145)
Total Bilirubin: 0.3 mg/dL (ref 0.3–1.2)
Total Protein: 6.6 g/dL (ref 6.5–8.1)

## 2022-11-24 MED ORDER — POTASSIUM CHLORIDE CRYS ER 20 MEQ PO TBCR
40.0000 meq | EXTENDED_RELEASE_TABLET | Freq: Once | ORAL | Status: AC
  Administered 2022-11-24: 40 meq via ORAL
  Filled 2022-11-24: qty 2

## 2022-11-24 MED ORDER — CHLORDIAZEPOXIDE HCL 25 MG PO CAPS
ORAL_CAPSULE | ORAL | 0 refills | Status: DC
  Filled 2022-11-24: qty 6, 3d supply, fill #0

## 2022-11-24 MED ORDER — CHLORDIAZEPOXIDE HCL 25 MG PO CAPS
50.0000 mg | ORAL_CAPSULE | Freq: Once | ORAL | Status: AC
  Administered 2022-11-24: 50 mg via ORAL
  Filled 2022-11-24: qty 2

## 2022-11-24 MED ORDER — LORAZEPAM 1 MG PO TABS
1.0000 mg | ORAL_TABLET | Freq: Once | ORAL | Status: AC
  Administered 2022-11-24: 1 mg via ORAL
  Filled 2022-11-24: qty 1

## 2022-11-24 NOTE — Discharge Instructions (Signed)
It was our pleasure to provide your ER care today - we hope that you feel better.  Avoid alcohol use as it is harmful to your physical health and mental well-being. See resource guide attached in terms of accessing inpatient or outpatient substance use treatment programs.  Take librium as need, as prescribed, to help with withdrawal type symptoms.   Also follow up closely with primary care doctor and behavioral health provider in the coming week.  For mental health issues and/or crisis, you may also go directly to the [facility] - they are open 24/7 and walk-ins are welcome.    Your potassium level is mildly low - eat plenty of fruits and vegetables, and follow up with primary care doctor in one week.   Return to ER if worse, new symptoms, fevers, chest pain, trouble breathing, or other emergency concern.

## 2022-11-24 NOTE — ED Provider Notes (Signed)
[facility] EMERGENCY DEPARTMENT Provider Note   CSN: [id number] Arrival date & time: 11/23/22  2142     History  Chief Complaint  Patient presents with   Alcohol Problem    Gary [name] is a 37 y.o. male.  Pt with hx etoh use disorder presents with concern for possible withdrawal symptoms. Last drank earlier this evening. Denies hx dts ?hx seizures  - pt limited/poor historian, level 5 caveat. Notes recent fall onto outstretched right hand, right wrist pain and abrasion to area. Tetanus up to date. Denies other pain or injury. No fever or chills. No headache. No neck or back pain. No chest pain or sob. No abd pain or nv.   The history is provided by the patient.       Home Medications Prior to Admission medications   Medication Sig Start Date End Date Taking? Authorizing Provider  bictegravir-emtricitabine-tenofovir AF (BIKTARVY) 50-200-25 MG TABS tablet Take 1 tablet by mouth daily. 10/23/22   Burnadette Pop, MD  folic acid (FOLVITE) 1 MG tablet Take 1 tablet (1 mg total) by mouth daily. 10/23/22   Burnadette Pop, MD  levETIRAcetam (KEPPRA) 500 MG tablet Take 1 tablet (500 mg total) by mouth 2 (two) times daily. 10/23/22   Burnadette Pop, MD  thiamine (VITAMIN B1) 100 MG tablet Take 1 tablet (100 mg total) by mouth daily. 10/23/22   Burnadette Pop, MD  famotidine (PEPCID) 20 MG tablet Take 1 tablet (20 mg total) by mouth 2 (two) times daily. Patient not taking: Reported on 06/01/2019 05/14/19 06/02/19  Benjiman Core, MD      Allergies    Tegretol [carbamazepine] and Caffeine    Review of Systems   Review of Systems  Constitutional:  Negative for fever.  HENT:  Negative for sore throat.   Eyes:  Negative for redness.  Respiratory:  Negative for shortness of breath.   Cardiovascular:  Negative for chest pain.  Gastrointestinal:  Negative for abdominal pain, diarrhea and vomiting.  Genitourinary:  Negative for flank pain.  Musculoskeletal:   Negative for back pain and neck pain.  Skin:  Negative for rash.  Neurological:  Negative for headaches.  Hematological:  Does not bruise/bleed easily.  Psychiatric/Behavioral:  Negative for confusion.     Physical Exam Updated Vital Signs BP 110/81   Pulse 91   Temp 98.5 F (36.9 C) (Oral)   Resp 20   Ht 1.829 m (6')   Wt 68 kg   SpO2 95%   BMI 20.34 kg/m  Physical Exam Vitals and nursing note reviewed.  Constitutional:      Appearance: Normal appearance. He is well-developed.  HENT:     Head: Atraumatic.     Nose: Nose normal.     Mouth/Throat:     Mouth: Mucous membranes are moist.     Pharynx: Oropharynx is clear.  Eyes:     General: No scleral icterus.    Conjunctiva/sclera: Conjunctivae normal.     Pupils: Pupils are equal, round, and reactive to light.  Neck:     Trachea: No tracheal deviation.  Cardiovascular:     Rate and Rhythm: Normal rate and regular rhythm.     Pulses: Normal pulses.     Heart sounds: Normal heart sounds. No murmur heard.    No friction rub. No gallop.  Pulmonary:     Effort: Pulmonary effort is normal. No accessory muscle usage or respiratory distress.     Breath sounds: Normal breath sounds.  Abdominal:  General: Bowel sounds are normal. There is no distension.     Palpations: Abdomen is soft.     Tenderness: There is no abdominal tenderness. There is no guarding.  Musculoskeletal:        General: No swelling.     Cervical back: Normal range of motion and neck supple. No rigidity.     Comments: CTLS spine, non tender, aligned, no step off. Tenderness right wrist, no focal scaphoid tenderness, radial pulse 2+. No other focal bony tenderness on bil extremity exam.  Pt with small blood blister to palm of right hand/wrist, ruptured, without sign of infection. No lacerations noted.   Skin:    General: Skin is warm and dry.     Findings: No rash.  Neurological:     Mental Status: He is alert.     Comments: Alert, speech clear. GCS  15. Motor/sens grossly intact bil. Mildly shaky.   Psychiatric:        Mood and Affect: Mood normal.     ED Results / Procedures / Treatments   Labs (all labs ordered are listed, but only abnormal results are displayed) Results for orders placed or performed during the hospital encounter of 11/23/22  Comprehensive metabolic panel  Result Value Ref Range   Sodium 133 (L) 135 - 145 mmol/L   Potassium 3.1 (L) 3.5 - 5.1 mmol/L   Chloride 99 98 - 111 mmol/L   CO2 21 (L) 22 - 32 mmol/L   Glucose, Bld 107 (H) 70 - 99 mg/dL   BUN <5 (L) 6 - 20 mg/dL   Creatinine, Ser 0.60 (L) 0.61 - 1.24 mg/dL   Calcium 8.0 (L) 8.9 - 10.3 mg/dL   Total Protein 6.6 6.5 - 8.1 g/dL   Albumin 3.5 3.5 - 5.0 g/dL   AST 89 (H) 15 - 41 U/L   ALT 56 (H) 0 - 44 U/L   Alkaline Phosphatase 69 38 - 126 U/L   Total Bilirubin 0.3 0.3 - 1.2 mg/dL   GFR, Estimated >60 >60 mL/min   Anion gap 13 5 - 15  CBC  Result Value Ref Range   WBC 3.0 (L) 4.0 - 10.5 K/uL   RBC 3.65 (L) 4.22 - 5.81 MIL/uL   Hemoglobin 12.6 (L) 13.0 - 17.0 g/dL   HCT 36.1 (L) 39.0 - 52.0 %   MCV 98.9 80.0 - 100.0 fL   MCH 34.5 (H) 26.0 - 34.0 pg   MCHC 34.9 30.0 - 36.0 g/dL   RDW 14.3 11.5 - 15.5 %   Platelets 166 150 - 400 K/uL   nRBC 0.0 0.0 - 0.2 %   DG Wrist Complete Right  Result Date: 11/24/2022 CLINICAL DATA:  Fall. Laceration to right wrist. Pain. EXAM: RIGHT WRIST - COMPLETE 3+ VIEW COMPARISON:  Right hand radiographs 11/23/2022 FINDINGS: Normal bone mineralization. Joint spaces are preserved. No acute fracture is seen. No dislocation. IMPRESSION: No acute fracture. Electronically Signed   By: Yvonne Kendall M.D.   On: 11/24/2022 09:19   DG Hand Complete Right  Result Date: 11/24/2022 CLINICAL DATA:  Fall with hand injury. EXAM: RIGHT HAND - COMPLETE 3+ VIEW COMPARISON:  12/04/2020. FINDINGS: There is no evidence of fracture or dislocation. There is no evidence of arthropathy or other focal bone abnormality. Soft tissues are  unremarkable. IMPRESSION: No acute fracture or dislocation. Electronically Signed   By: Brett Fairy M.D.   On: 11/24/2022 00:09   CT Head Wo Contrast  Result Date: 11/08/2022 CLINICAL DATA:  Neuro  deficit, acute, stroke suspected EXAM: CT HEAD WITHOUT CONTRAST TECHNIQUE: Contiguous axial images were obtained from the base of the skull through the vertex without intravenous contrast. RADIATION DOSE REDUCTION: This exam was performed according to the departmental dose-optimization program which includes automated exposure control, adjustment of the mA and/or kV according to patient size and/or use of iterative reconstruction technique. COMPARISON:  08/14/2022. FINDINGS: Brain: No evidence of acute infarction, hemorrhage, hydrocephalus, extra-axial collection or mass lesion/mass effect. Ventricles, sulci and cisterns are prominent consistent with involutional changes that are advanced for age. Vascular: No hyperdense vessel or unexpected calcification. Skull: Normal. Negative for fracture or focal lesion. Sinuses/Orbits: No acute finding. IMPRESSION: Involutional changes, advanced for age. No acute intracranial process. Electronically Signed   By: Sammie Bench M.D.   On: 11/08/2022 22:48   DG Chest Port 1 View  Result Date: 11/08/2022 CLINICAL DATA:  Weakness, seizures. EXAM: PORTABLE CHEST 1 VIEW COMPARISON:  CT abdomen pelvis dated September 16, 2022 and chest radiograph dated August 14, 2022. FINDINGS: The heart is normal in size. Right lung is clear. Chronic elevation of the left hemidiaphragm with left basilar opacity suggesting atelectasis or infiltrate. No acute osseous abnormality. IMPRESSION: Chronic elevation of the left hemidiaphragm with left basilar opacity suggesting atelectasis or infiltrate. Electronically Signed   By: Keane Police D.O.   On: 11/08/2022 22:01    EKG None  Radiology DG Wrist Complete Right  Result Date: 11/24/2022 CLINICAL DATA:  Fall. Laceration to right wrist.  Pain. EXAM: RIGHT WRIST - COMPLETE 3+ VIEW COMPARISON:  Right hand radiographs 11/23/2022 FINDINGS: Normal bone mineralization. Joint spaces are preserved. No acute fracture is seen. No dislocation. IMPRESSION: No acute fracture. Electronically Signed   By: Yvonne Kendall M.D.   On: 11/24/2022 09:19   DG Hand Complete Right  Result Date: 11/24/2022 CLINICAL DATA:  Fall with hand injury. EXAM: RIGHT HAND - COMPLETE 3+ VIEW COMPARISON:  12/04/2020. FINDINGS: There is no evidence of fracture or dislocation. There is no evidence of arthropathy or other focal bone abnormality. Soft tissues are unremarkable. IMPRESSION: No acute fracture or dislocation. Electronically Signed   By: Brett Fairy M.D.   On: 11/24/2022 00:09    Procedures Procedures    Medications Ordered in ED Medications  chlordiazePOXIDE (LIBRIUM) capsule 50 mg (50 mg Oral Given 11/24/22 1109)  LORazepam (ATIVAN) tablet 1 mg (1 mg Oral Given 11/24/22 1220)  potassium chloride SA (KLOR-CON M) CR tablet 40 mEq (40 mEq Oral Given 11/24/22 1220)    ED Course/ Medical Decision Making/ A&P                             Medical Decision Making Problems Addressed: Alcohol use disorder: chronic illness or injury with exacerbation, progression, or side effects of treatment that poses a threat to life or bodily functions    Details: 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 Labs: ordered.  Decision-making details documented in ED Course. Radiology: ordered and  independent interpretation performed. Decision-making details documented in ED Course.  Risk Prescription drug management. Decision regarding hospitalization.   Iv ns. Continuous pulse ox and cardiac monitoring. Labs ordered/sent. Imaging ordered.   Differential diagnosis includes  etoh use disorder, related mood disorder, withdrawal symptoms, electrolyte abn, dehydration etc. Dispo decision including potential need for admission considered - will get labs and imaging and reassess.   Reviewed nursing notes and prior charts for additional history. External reports reviewed. Additional history from: EMS.   Cardiac monitor: sinus rhythm, rate 90.  Labs reviewed/interpreted by me - k mildly low. Kcl po. Po fluids/food.   Abrasions cleaned.   Xrays reviewed/interpreted by me - no fx.   Librium po.   Recheck, pt resting comfortably. Vitals normal. No delusions or hallucinations. No nvd. No diaphoresis. No severe tremor or anxiety.   Pt reports feeling improved. No thoughts of harm to self or others.  Pt has eaten/drank, no nv.   Pt currently appears stable for d/c.  Pt is encouraged to pursue sobriety and treatment program for etoh use disorder - several resources provided. Will also provided additional behavioral health, pcp and social service resources.    Return precautions provided.            Final Clinical Impression(s) / ED Diagnoses Final diagnoses:  None    Rx / DC Orders ED Discharge Orders     None         Cathren Laine, MD 11/24/22 1233

## 2022-11-24 NOTE — ED Notes (Signed)
Pts belongings placed in locker 10

## 2022-11-24 NOTE — ED Notes (Signed)
Patient's hand cleansed with soap and water. Pt offered bandages but pt denied

## 2022-11-24 NOTE — ED Notes (Signed)
Pt belongings retrieved from locker #10 and returned to the patient

## 2022-11-25 ENCOUNTER — Other Ambulatory Visit: Payer: Self-pay

## 2022-11-25 ENCOUNTER — Encounter (HOSPITAL_COMMUNITY): Payer: Self-pay

## 2022-11-25 ENCOUNTER — Emergency Department (HOSPITAL_COMMUNITY)
Admission: EM | Admit: 2022-11-25 | Discharge: 2022-11-26 | Disposition: A | Discharge status: Home / Self Care | Payer: Medicaid Other | Attending: Emergency Medicine | Admitting: Emergency Medicine

## 2022-11-25 DIAGNOSIS — F10220 Alcohol dependence with intoxication, uncomplicated: Secondary | ICD-10-CM | POA: Insufficient documentation

## 2022-11-25 DIAGNOSIS — F10920 Alcohol use, unspecified with intoxication, uncomplicated: Secondary | ICD-10-CM

## 2022-11-25 NOTE — ED Triage Notes (Signed)
[place].   Says he has had 4 bottles of wine and half bottle of vodka.

## 2022-11-25 NOTE — ED Notes (Signed)
Pt provided crackers, sandwich by RN. Pt having no difficulty with PO liquids or food.

## 2022-11-25 NOTE — ED Provider Notes (Signed)
[facility] DEPT Provider Note   CSN: [id number] Arrival date & time: 11/25/22  2115     History  Chief Complaint  Patient presents with   Alcohol Intoxication    Gary [name] is a 37 y.o. male.  HPI   Patient with medical history including alcohol dependency, alcoholic seizures, homelessness, presents with complaints of alcohol intoxication.  Patient states that he drank approximately 4 bottles of wine and half bottle of vodka earlier today, states that he has no complaints.  He denies any suicidal/homicidal ideations, denies any hallucinations or delusions, states that he just wants a sandwich and something to drink.  He not endorsing any headaches chest pain shortness of breath stomach pains nausea vomiting.    Home Medications Prior to Admission medications   Medication Sig Start Date End Date Taking? Authorizing Provider  bictegravir-emtricitabine-tenofovir AF (BIKTARVY) 50-200-25 MG TABS tablet Take 1 tablet by mouth daily. 10/23/22   Shelly Coss, MD  chlordiazePOXIDE (LIBRIUM) 25 MG capsule Take 1 capsule by mouth 3 times a day for 1 day, THEN 1 capsule twice daily for 1 day, THEN 1 capsule daily for 1 day. 11/24/22   Lajean Saver, MD  folic acid (FOLVITE) 1 MG tablet Take 1 tablet (1 mg total) by mouth daily. 10/23/22   Shelly Coss, MD  levETIRAcetam (KEPPRA) 500 MG tablet Take 1 tablet (500 mg total) by mouth 2 (two) times daily. 10/23/22   Shelly Coss, MD  thiamine (VITAMIN B1) 100 MG tablet Take 1 tablet (100 mg total) by mouth daily. 10/23/22   Shelly Coss, MD  famotidine (PEPCID) 20 MG tablet Take 1 tablet (20 mg total) by mouth 2 (two) times daily. Patient not taking: Reported on 06/01/2019 05/14/19 06/02/19  Davonna Belling, MD      Allergies    Tegretol [carbamazepine] and Caffeine    Review of Systems   Review of Systems  Constitutional:  Negative for chills and fever.  Respiratory:  Negative for shortness of  breath.   Cardiovascular:  Negative for chest pain.  Gastrointestinal:  Negative for abdominal pain.  Neurological:  Negative for headaches.    Physical Exam Updated Vital Signs BP 111/89   Pulse 94   Temp 98.1 F (36.7 C) (Oral)   Resp 17   SpO2 97%  Physical Exam Vitals and nursing note reviewed.  Constitutional:      General: He is not in acute distress.    Appearance: He is not ill-appearing.  HENT:     Head: Normocephalic and atraumatic.     Comments: There is no deformity of the head present no raccoon eyes or Battle sign noted.    Nose: No congestion.     Mouth/Throat:     Mouth: Mucous membranes are moist.     Pharynx: Oropharynx is clear.     Comments: No oral trauma present Eyes:     Extraocular Movements: Extraocular movements intact.     Conjunctiva/sclera: Conjunctivae normal.  Cardiovascular:     Rate and Rhythm: Normal rate and regular rhythm.     Pulses: Normal pulses.  Pulmonary:     Effort: Pulmonary effort is normal.  Musculoskeletal:     Comments: Moving his upper and lower extremities out difficulty.  Skin:    General: Skin is warm and dry.  Neurological:     Mental Status: He is alert.     Comments: No facial asymmetry no difficulty with word finding following two-step commands there is no unilateral weakness present.  Psychiatric:        Mood and Affect: Mood normal.     Comments: Patient appears to be slightly intoxicated, he is not endorsing any suicidal/homicidal ideations, denies any hallucinations or delusions.  He is responding appropriately to all questions does not appear to be respond to internal stimuli.     ED Results / Procedures / Treatments   Labs (all labs ordered are listed, but only abnormal results are displayed) Labs Reviewed - No data to display  EKG None  Radiology DG Wrist Complete Right  Result Date: 11/24/2022 CLINICAL DATA:  Fall. Laceration to right wrist. Pain. EXAM: RIGHT WRIST - COMPLETE 3+ VIEW COMPARISON:   Right hand radiographs 11/23/2022 FINDINGS: Normal bone mineralization. Joint spaces are preserved. No acute fracture is seen. No dislocation. IMPRESSION: No acute fracture. Electronically Signed   By: Neita Garnet M.D.   On: 11/24/2022 09:19    Procedures Procedures    Medications Ordered in ED Medications - No data to display  ED Course/ Medical Decision Making/ A&P                             Medical Decision Making  This patient presents to the ED for concern of alcohol intoxication, this involves an extensive number of treatment options, and is a complaint that carries with it a high risk of complications and morbidity.  The differential diagnosis includes metabolic derailment, withdrawals, psychiatric emergency    Additional history obtained:  Additional history obtained from N/A External records from outside source obtained and reviewed including recent notes   Co morbidities that complicate the patient evaluation  Alcohol dependency  Social Determinants of Health:  Homelessness    Lab Tests:  I Ordered, and personally interpreted labs.  The pertinent results include: N/A   Imaging Studies ordered:  I ordered imaging studies including N/A I independently visualized and interpreted imaging which showed N/A I agree with the radiologist interpretation   Cardiac Monitoring:  The patient was maintained on a cardiac monitor.  I personally viewed and interpreted the cardiac monitored which showed an underlying rhythm of: N/A   Medicines ordered and prescription drug management:  I ordered medication including N/A I have reviewed the patients home medicines and have made adjustments as needed  Critical Interventions:  N/A   Reevaluation:  Presents with alcohol intoxication, he appears to be slightly intoxicated on my examination, he is requesting a sandwich and drink, will provide both, and monitor.  Reassessed, resting comfortably, he is tolerating  p.o., does not endorse any HI or SI, patient is at his baseline, agreement with discharge at this time.  Consultations Obtained:  N/a    Test Considered:  N/a    Rule out I doubt withdrawals as he is nontremulous on my exam, vital signs are reassuring.  I doubt psychiatric emergency not endorsing any suicidal homicidal ideations.  I doubt metabolic derailment is not having any nausea or vomiting he has no complaints at this time.    Dispostion and problem list  After consideration of the diagnostic results and the patients response to treatment, I feel that the patent would benefit from discharge.   Alcohol intoxication-uncomplicated, will refer him to outpatient resources.  Also provide him with shelters within the area           Final Clinical Impression(s) / ED Diagnoses Final diagnoses:  Alcoholic intoxication without complication (HCC)    Rx / DC Orders ED Discharge  Orders     None         Aron Baba 11/26/22 0028    Fatima Blank, MD 11/26/22 [phone]

## 2022-11-26 NOTE — Discharge Instructions (Signed)
Please follow-up with the outpatient resources in regards to alcohol consumption of also given you resources for shelters within the area.  Come back to the emergency department if you develop chest pain, shortness of breath, severe abdominal pain, uncontrolled nausea, vomiting, diarrhea.

## 2022-11-26 NOTE — ED Notes (Addendum)
Tolerated food and water.   Refused discharge vital signs

## 2022-12-10 ENCOUNTER — Emergency Department (HOSPITAL_COMMUNITY)
Admission: EM | Admit: 2022-12-10 | Discharge: 2022-12-10 | Disposition: A | Discharge status: Home / Self Care | Payer: Medicaid Other | Attending: Emergency Medicine | Admitting: Emergency Medicine

## 2022-12-10 DIAGNOSIS — Z21 Asymptomatic human immunodeficiency virus [HIV] infection status: Secondary | ICD-10-CM | POA: Diagnosis not present

## 2022-12-10 DIAGNOSIS — Y908 Blood alcohol level of 240 mg/100 ml or more: Secondary | ICD-10-CM | POA: Insufficient documentation

## 2022-12-10 DIAGNOSIS — F10129 Alcohol abuse with intoxication, unspecified: Secondary | ICD-10-CM | POA: Diagnosis present

## 2022-12-10 DIAGNOSIS — F10120 Alcohol abuse with intoxication, uncomplicated: Secondary | ICD-10-CM | POA: Insufficient documentation

## 2022-12-10 DIAGNOSIS — F10920 Alcohol use, unspecified with intoxication, uncomplicated: Secondary | ICD-10-CM

## 2022-12-10 LAB — ETHANOL: Alcohol, Ethyl (B): 386 mg/dL (ref <10)

## 2022-12-10 NOTE — ED Provider Notes (Signed)
[facility] Provider Note   CSN: [id number] Arrival date & time: 12/10/22  0000     History  Chief Complaint  Patient presents with   Alcohol Intoxication    Gary [name] is a 37 y.o. male.  HPI     This is a 37 year old male well-known to our emergency department who presents with alcohol intoxication.  Patient frequently presents to emergency department with alcohol intoxication.  Patient reports that he drank 1/5 of liquor yesterday but "it just was not enough."  He states he generally is not feeling well.  Denies SI or HI.  Home Medications Prior to Admission medications   Medication Sig Start Date End Date Taking? Authorizing Provider  bictegravir-emtricitabine-tenofovir AF (BIKTARVY) 50-200-25 MG TABS tablet Take 1 tablet by mouth daily. 10/23/22   Shelly Coss, MD  chlordiazePOXIDE (LIBRIUM) 25 MG capsule Take 1 capsule by mouth 3 times a day for 1 day, THEN 1 capsule twice daily for 1 day, THEN 1 capsule daily for 1 day. 11/24/22   Lajean Saver, MD  folic acid (FOLVITE) 1 MG tablet Take 1 tablet (1 mg total) by mouth daily. 10/23/22   Shelly Coss, MD  levETIRAcetam (KEPPRA) 500 MG tablet Take 1 tablet (500 mg total) by mouth 2 (two) times daily. 10/23/22   Shelly Coss, MD  thiamine (VITAMIN B1) 100 MG tablet Take 1 tablet (100 mg total) by mouth daily. 10/23/22   Shelly Coss, MD  famotidine (PEPCID) 20 MG tablet Take 1 tablet (20 mg total) by mouth 2 (two) times daily. Patient not taking: Reported on 06/01/2019 05/14/19 06/02/19  Davonna Belling, MD      Allergies    Tegretol [carbamazepine] and Caffeine    Review of Systems   Review of Systems  Psychiatric/Behavioral:  Negative for suicidal ideas.   All other systems reviewed and are negative.   Physical Exam Updated Vital Signs BP 124/75 (BP Location: Right Arm)   Pulse (!) 115   Temp 97.9 F (36.6 C) (Oral)   Resp 18   SpO2 97%  Physical  Exam Vitals and nursing note reviewed.  Constitutional:      Appearance: He is well-developed.     Comments: Disheveled appearing  HENT:     Head: Normocephalic and atraumatic.  Eyes:     Pupils: Pupils are equal, round, and reactive to light.     Comments: Bilateral injected conjunctiva  Cardiovascular:     Rate and Rhythm: Normal rate and regular rhythm.  Pulmonary:     Effort: Pulmonary effort is normal. No respiratory distress.  Abdominal:     Palpations: Abdomen is soft.     Tenderness: There is no abdominal tenderness.  Musculoskeletal:     Cervical back: Neck supple.  Lymphadenopathy:     Cervical: No cervical adenopathy.  Skin:    General: Skin is warm and dry.  Neurological:     Mental Status: He is alert and oriented to person, place, and time.  Psychiatric:        Mood and Affect: Mood normal.     ED Results / Procedures / Treatments   Labs (all labs ordered are listed, but only abnormal results are displayed) Labs Reviewed  ETHANOL - Abnormal; Notable for the following components:      Result Value   Alcohol, Ethyl (B) 386 (*)    All other components within normal limits    EKG None  Radiology No results found.  Procedures Procedures  Medications Ordered in ED Medications - No data to display  ED Course/ Medical Decision Making/ A&P                             Medical Decision Making Amount and/or Complexity of Data Reviewed Labs: ordered.   This patient presents to the ED for concern of alcohol intoxication, this involves an extensive number of treatment options, and is a complaint that carries with it a high risk of complications and morbidity.  I considered the following differential and admission for this acute, potentially life threatening condition.  The differential diagnosis includes intoxication, withdrawal, complication related  MDM:    This is a 37 year old male well-known to our emergency department who presents with alcohol  intoxication.  He is homeless.  He has presented multiple times with intoxication, withdrawal, and complications related to both.  Reports recent alcohol use.  He does not appear to be acutely withdrawing.  He appears acutely intoxicated.  Blood alcohol level 386.  Patient was allowed to metabolize.  Do not feel he needs further workup at this time.  He denies SI or HI.  Patient was ambulatory independently at discharge.  (Labs, imaging, consults)  Labs: I Ordered, and personally interpreted labs.  The pertinent results include: EtOH level  Imaging Studies ordered: I ordered imaging studies including none I independently visualized and interpreted imaging. I agree with the radiologist interpretation  Additional history obtained from chart review.  External records from outside source obtained and reviewed including prior evaluations  Cardiac Monitoring: The patient was maintained on a cardiac monitor.  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   I have reviewed the patients home medicines and have made adjustments as needed  Problem List / ED Course: Problem List Items Addressed This Visit   None Visit Diagnoses     Acute alcoholic intoxication without  complication ([place])    -  Primary                   Final Clinical Impression(s) / ED Diagnoses Final diagnoses:  Acute alcoholic intoxication without complication [facility])    Rx / DC Orders ED Discharge Orders     None         Merryl Hacker, MD 12/10/22 [phone]

## 2022-12-10 NOTE — ED Triage Notes (Signed)
Pt reports he isn't feeling well and reports "I am a bad alcoholic and I haven't drank enough." Pt reports he has drank a fifth of liquor tonight. Pt does not show signs of obvious withdrawal.

## 2022-12-14 ENCOUNTER — Inpatient Hospital Stay (HOSPITAL_COMMUNITY)
Admission: EM | Admit: 2022-12-14 | Discharge: 2022-12-22 | DRG: 439 | Discharge status: Against Medical Advice | Payer: Medicaid Other | Attending: Pulmonary Disease | Admitting: Pulmonary Disease

## 2022-12-14 ENCOUNTER — Emergency Department (HOSPITAL_COMMUNITY): Payer: Medicaid Other

## 2022-12-14 ENCOUNTER — Inpatient Hospital Stay (HOSPITAL_COMMUNITY): Payer: Medicaid Other

## 2022-12-14 DIAGNOSIS — Z79899 Other long term (current) drug therapy: Secondary | ICD-10-CM

## 2022-12-14 DIAGNOSIS — D72819 Decreased white blood cell count, unspecified: Secondary | ICD-10-CM | POA: Diagnosis present

## 2022-12-14 DIAGNOSIS — D6959 Other secondary thrombocytopenia: Secondary | ICD-10-CM | POA: Diagnosis present

## 2022-12-14 DIAGNOSIS — Z800 Family history of malignant neoplasm of digestive organs: Secondary | ICD-10-CM

## 2022-12-14 DIAGNOSIS — F10229 Alcohol dependence with intoxication, unspecified: Secondary | ICD-10-CM | POA: Diagnosis present

## 2022-12-14 DIAGNOSIS — F419 Anxiety disorder, unspecified: Secondary | ICD-10-CM | POA: Diagnosis present

## 2022-12-14 DIAGNOSIS — R7989 Other specified abnormal findings of blood chemistry: Secondary | ICD-10-CM | POA: Diagnosis present

## 2022-12-14 DIAGNOSIS — F3181 Bipolar II disorder: Secondary | ICD-10-CM | POA: Diagnosis present

## 2022-12-14 DIAGNOSIS — B20 Human immunodeficiency virus [HIV] disease: Secondary | ICD-10-CM | POA: Diagnosis present

## 2022-12-14 DIAGNOSIS — E876 Hypokalemia: Secondary | ICD-10-CM | POA: Diagnosis present

## 2022-12-14 DIAGNOSIS — F4310 Post-traumatic stress disorder, unspecified: Secondary | ICD-10-CM | POA: Diagnosis present

## 2022-12-14 DIAGNOSIS — D696 Thrombocytopenia, unspecified: Secondary | ICD-10-CM | POA: Diagnosis present

## 2022-12-14 DIAGNOSIS — Z21 Asymptomatic human immunodeficiency virus [HIV] infection status: Secondary | ICD-10-CM | POA: Diagnosis present

## 2022-12-14 DIAGNOSIS — G40909 Epilepsy, unspecified, not intractable, without status epilepticus: Secondary | ICD-10-CM | POA: Diagnosis present

## 2022-12-14 DIAGNOSIS — K852 Alcohol induced acute pancreatitis without necrosis or infection: Principal | ICD-10-CM | POA: Diagnosis present

## 2022-12-14 DIAGNOSIS — Z888 Allergy status to other drugs, medicaments and biological substances status: Secondary | ICD-10-CM

## 2022-12-14 DIAGNOSIS — J9811 Atelectasis: Secondary | ICD-10-CM | POA: Diagnosis present

## 2022-12-14 DIAGNOSIS — R0902 Hypoxemia: Secondary | ICD-10-CM | POA: Diagnosis present

## 2022-12-14 DIAGNOSIS — Z1152 Encounter for screening for COVID-19: Secondary | ICD-10-CM

## 2022-12-14 DIAGNOSIS — F17210 Nicotine dependence, cigarettes, uncomplicated: Secondary | ICD-10-CM | POA: Diagnosis present

## 2022-12-14 DIAGNOSIS — F10231 Alcohol dependence with withdrawal delirium: Secondary | ICD-10-CM | POA: Diagnosis not present

## 2022-12-14 DIAGNOSIS — K760 Fatty (change of) liver, not elsewhere classified: Secondary | ICD-10-CM | POA: Diagnosis present

## 2022-12-14 DIAGNOSIS — F1010 Alcohol abuse, uncomplicated: Secondary | ICD-10-CM | POA: Diagnosis present

## 2022-12-14 DIAGNOSIS — Y908 Blood alcohol level of 240 mg/100 ml or more: Secondary | ICD-10-CM | POA: Diagnosis present

## 2022-12-14 DIAGNOSIS — F209 Schizophrenia, unspecified: Secondary | ICD-10-CM | POA: Diagnosis present

## 2022-12-14 DIAGNOSIS — F10932 Alcohol use, unspecified with withdrawal with perceptual disturbance: Secondary | ICD-10-CM | POA: Diagnosis not present

## 2022-12-14 DIAGNOSIS — F10929 Alcohol use, unspecified with intoxication, unspecified: Secondary | ICD-10-CM | POA: Diagnosis not present

## 2022-12-14 DIAGNOSIS — Z5900 Homelessness unspecified: Secondary | ICD-10-CM | POA: Diagnosis not present

## 2022-12-14 DIAGNOSIS — D61818 Other pancytopenia: Secondary | ICD-10-CM

## 2022-12-14 DIAGNOSIS — F10939 Alcohol use, unspecified with withdrawal, unspecified: Secondary | ICD-10-CM | POA: Diagnosis not present

## 2022-12-14 DIAGNOSIS — F10931 Alcohol use, unspecified with withdrawal delirium: Secondary | ICD-10-CM

## 2022-12-14 DIAGNOSIS — Z87898 Personal history of other specified conditions: Secondary | ICD-10-CM

## 2022-12-14 DIAGNOSIS — R451 Restlessness and agitation: Secondary | ICD-10-CM | POA: Diagnosis not present

## 2022-12-14 DIAGNOSIS — J189 Pneumonia, unspecified organism: Secondary | ICD-10-CM

## 2022-12-14 LAB — CBC WITH DIFFERENTIAL/PLATELET
Abs Immature Granulocytes: 0.01 10*3/uL (ref 0.00–0.07)
Basophils Absolute: 0 10*3/uL (ref 0.0–0.1)
Basophils Relative: 1 %
Eosinophils Absolute: 0 10*3/uL (ref 0.0–0.5)
Eosinophils Relative: 1 %
HCT: 41.6 % (ref 39.0–52.0)
Hemoglobin: 14.1 g/dL (ref 13.0–17.0)
Immature Granulocytes: 0 %
Lymphocytes Relative: 18 %
Lymphs Abs: 0.6 10*3/uL — ABNORMAL LOW (ref 0.7–4.0)
MCH: 33.3 pg (ref 26.0–34.0)
MCHC: 33.9 g/dL (ref 30.0–36.0)
MCV: 98.3 fL (ref 80.0–100.0)
Monocytes Absolute: 0.2 10*3/uL (ref 0.1–1.0)
Monocytes Relative: 5 %
Neutro Abs: 2.5 10*3/uL (ref 1.7–7.7)
Neutrophils Relative %: 75 %
Platelets: 97 10*3/uL — ABNORMAL LOW (ref 150–400)
RBC: 4.23 MIL/uL (ref 4.22–5.81)
RDW: 14.2 % (ref 11.5–15.5)
WBC: 3.3 10*3/uL — ABNORMAL LOW (ref 4.0–10.5)
nRBC: 0 % (ref 0.0–0.2)

## 2022-12-14 LAB — RESP PANEL BY RT-PCR (RSV, FLU A&B, COVID)  RVPGX2
Influenza A by PCR: NEGATIVE
Influenza B by PCR: NEGATIVE
Resp Syncytial Virus by PCR: NEGATIVE
SARS Coronavirus 2 by RT PCR: NEGATIVE

## 2022-12-14 LAB — MAGNESIUM
Magnesium: 2 mg/dL (ref 1.7–2.4)
Magnesium: 1.7 mg/dL (ref 1.7–2.4)

## 2022-12-14 LAB — HEPATITIS PANEL, ACUTE
HCV Ab: NONREACTIVE
Hep A IgM: NONREACTIVE
Hep B C IgM: NONREACTIVE
Hepatitis B Surface Ag: NONREACTIVE

## 2022-12-14 LAB — COMPREHENSIVE METABOLIC PANEL
ALT: 106 U/L — ABNORMAL HIGH (ref 0–44)
AST: 225 U/L — ABNORMAL HIGH (ref 15–41)
Albumin: 4 g/dL (ref 3.5–5.0)
Alkaline Phosphatase: 80 U/L (ref 38–126)
Anion gap: 14 (ref 5–15)
BUN: 7 mg/dL (ref 6–20)
CO2: 25 mmol/L (ref 22–32)
Calcium: 8.3 mg/dL — ABNORMAL LOW (ref 8.9–10.3)
Chloride: 102 mmol/L (ref 98–111)
Creatinine, Ser: 0.62 mg/dL (ref 0.61–1.24)
GFR, Estimated: >60 mL/min (ref >60)
Glucose, Bld: 111 mg/dL — ABNORMAL HIGH (ref 70–99)
Potassium: 3.6 mmol/L (ref 3.5–5.1)
Sodium: 141 mmol/L (ref 135–145)
Total Bilirubin: 0.8 mg/dL (ref 0.3–1.2)
Total Protein: 7.8 g/dL (ref 6.5–8.1)

## 2022-12-14 LAB — LIPASE, BLOOD: Lipase: 859 U/L — ABNORMAL HIGH (ref 11–51)

## 2022-12-14 MED ORDER — LORAZEPAM 2 MG/ML IJ SOLN
1.0000 mg | INTRAMUSCULAR | Status: DC | PRN
  Administered 2022-12-14: 2 mg via INTRAVENOUS
  Administered 2022-12-16: 4 mg via INTRAVENOUS
  Filled 2022-12-14: qty 1
  Filled 2022-12-14: qty 2
  Filled 2022-12-14 (×3): qty 1

## 2022-12-14 MED ORDER — LEVETIRACETAM IN NACL 500 MG/100ML IV SOLN
500.0000 mg | Freq: Two times a day (BID) | INTRAVENOUS | Status: DC
  Administered 2022-12-14 – 2022-12-22 (×16): 500 mg via INTRAVENOUS
  Filled 2022-12-14 (×17): qty 100

## 2022-12-14 MED ORDER — FAMOTIDINE IN NACL 20-0.9 MG/50ML-% IV SOLN
20.0000 mg | Freq: Once | INTRAVENOUS | Status: AC
  Administered 2022-12-14: 20 mg via INTRAVENOUS
  Filled 2022-12-14: qty 50

## 2022-12-14 MED ORDER — ONDANSETRON HCL 4 MG/2ML IJ SOLN
4.0000 mg | Freq: Once | INTRAMUSCULAR | Status: AC
  Administered 2022-12-14: 4 mg via INTRAVENOUS
  Filled 2022-12-14: qty 2

## 2022-12-14 MED ORDER — ALUM & MAG HYDROXIDE-SIMETH 200-200-20 MG/5ML PO SUSP
30.0000 mL | Freq: Once | ORAL | Status: AC
  Administered 2022-12-14: 30 mL via ORAL
  Filled 2022-12-14: qty 30

## 2022-12-14 MED ORDER — SODIUM CHLORIDE 0.9 % IV SOLN
1.0000 g | Freq: Once | INTRAVENOUS | Status: AC
  Administered 2022-12-14: 1 g via INTRAVENOUS
  Filled 2022-12-14: qty 10

## 2022-12-14 MED ORDER — LACTATED RINGERS IV SOLN: INTRAVENOUS | Status: DC

## 2022-12-14 MED ORDER — LORAZEPAM 2 MG/ML IJ SOLN
0.0000 mg | INTRAMUSCULAR | Status: AC
  Administered 2022-12-14: 3 mg via INTRAVENOUS
  Administered 2022-12-14 – 2022-12-15 (×2): 2 mg via INTRAVENOUS
  Administered 2022-12-15: 3 mg via INTRAVENOUS
  Administered 2022-12-15 (×2): 2 mg via INTRAVENOUS
  Administered 2022-12-16 (×3): 3 mg via INTRAVENOUS
  Administered 2022-12-16: 4 mg via INTRAVENOUS
  Filled 2022-12-14: qty 2
  Filled 2022-12-14 (×3): qty 1
  Filled 2022-12-14 (×2): qty 2
  Filled 2022-12-14: qty 1
  Filled 2022-12-14: qty 2
  Filled 2022-12-14: qty 1

## 2022-12-14 MED ORDER — ADULT MULTIVITAMIN W/MINERALS CH
1.0000 | ORAL_TABLET | Freq: Every day | ORAL | Status: DC
  Administered 2022-12-14 – 2022-12-20 (×3): 1 via ORAL
  Filled 2022-12-14 (×3): qty 1

## 2022-12-14 MED ORDER — THIAMINE MONONITRATE 100 MG PO TABS
100.0000 mg | ORAL_TABLET | Freq: Every day | ORAL | Status: DC
  Administered 2022-12-16 – 2022-12-22 (×3): 100 mg via ORAL
  Filled 2022-12-14 (×3): qty 1

## 2022-12-14 MED ORDER — LORAZEPAM 2 MG/ML IJ SOLN
0.0000 mg | Freq: Three times a day (TID) | INTRAMUSCULAR | Status: AC
  Administered 2022-12-17: 2 mg via INTRAVENOUS
  Administered 2022-12-17: 4 mg via INTRAVENOUS
  Administered 2022-12-17: 2 mg via INTRAVENOUS
  Administered 2022-12-18: 1 mg via INTRAVENOUS
  Filled 2022-12-14: qty 1
  Filled 2022-12-14: qty 2
  Filled 2022-12-14 (×2): qty 1

## 2022-12-14 MED ORDER — PROCHLORPERAZINE EDISYLATE 10 MG/2ML IJ SOLN
10.0000 mg | Freq: Four times a day (QID) | INTRAMUSCULAR | Status: DC | PRN
  Administered 2022-12-15: 10 mg via INTRAVENOUS
  Filled 2022-12-14: qty 2

## 2022-12-14 MED ORDER — SODIUM CHLORIDE 0.9 % IV SOLN
500.0000 mg | INTRAVENOUS | Status: DC
  Administered 2022-12-15: 500 mg via INTRAVENOUS
  Filled 2022-12-14: qty 5

## 2022-12-14 MED ORDER — BICTEGRAVIR-EMTRICITAB-TENOFOV 50-200-25 MG PO TABS
1.0000 | ORAL_TABLET | Freq: Every day | ORAL | Status: DC
  Administered 2022-12-14 – 2022-12-22 (×7): 1 via ORAL
  Filled 2022-12-14 (×9): qty 1

## 2022-12-14 MED ORDER — HYDROMORPHONE HCL 1 MG/ML IJ SOLN
1.0000 mg | Freq: Once | INTRAMUSCULAR | Status: AC
  Administered 2022-12-14: 1 mg via INTRAVENOUS
  Filled 2022-12-14: qty 1

## 2022-12-14 MED ORDER — DOXYCYCLINE HYCLATE 100 MG PO TABS
100.0000 mg | ORAL_TABLET | Freq: Once | ORAL | Status: AC
  Administered 2022-12-14: 100 mg via ORAL
  Filled 2022-12-14: qty 1

## 2022-12-14 MED ORDER — LORAZEPAM 1 MG PO TABS
1.0000 mg | ORAL_TABLET | ORAL | Status: DC | PRN
  Administered 2022-12-15 (×2): 2 mg via ORAL
  Filled 2022-12-14 (×2): qty 2

## 2022-12-14 MED ORDER — MIDAZOLAM HCL 2 MG/2ML IJ SOLN
2.0000 mg | Freq: Once | INTRAMUSCULAR | Status: AC
  Administered 2022-12-14: 2 mg via INTRAVENOUS
  Filled 2022-12-14: qty 2

## 2022-12-14 MED ORDER — THIAMINE HCL 100 MG/ML IJ SOLN
100.0000 mg | Freq: Every day | INTRAMUSCULAR | Status: DC
  Administered 2022-12-14 – 2022-12-21 (×6): 100 mg via INTRAVENOUS
  Filled 2022-12-14 (×6): qty 2

## 2022-12-14 MED ORDER — LIDOCAINE VISCOUS HCL 2 % MT SOLN
15.0000 mL | Freq: Once | OROMUCOSAL | Status: AC
  Administered 2022-12-14: 15 mL via ORAL
  Filled 2022-12-14: qty 15

## 2022-12-14 MED ORDER — FOLIC ACID 1 MG PO TABS
1.0000 mg | ORAL_TABLET | Freq: Every day | ORAL | Status: DC
  Administered 2022-12-14 – 2022-12-22 (×6): 1 mg via ORAL
  Filled 2022-12-14 (×6): qty 1

## 2022-12-14 MED ORDER — SODIUM CHLORIDE 0.9 % IV SOLN: 2.0000 g | INTRAVENOUS | Status: DC

## 2022-12-14 MED ORDER — HYDROMORPHONE HCL 1 MG/ML IJ SOLN
1.0000 mg | INTRAMUSCULAR | Status: DC | PRN
  Administered 2022-12-14 – 2022-12-15 (×2): 1 mg via INTRAVENOUS
  Filled 2022-12-14 (×3): qty 1

## 2022-12-14 MED ORDER — SODIUM CHLORIDE 0.9 % IV BOLUS
1000.0000 mL | Freq: Once | INTRAVENOUS | Status: AC
  Administered 2022-12-14: 1000 mL via INTRAVENOUS

## 2022-12-14 MED ORDER — CALCIUM GLUCONATE-NACL 1-0.675 GM/50ML-% IV SOLN
1.0000 g | Freq: Once | INTRAVENOUS | Status: AC
  Administered 2022-12-14: 1000 mg via INTRAVENOUS
  Filled 2022-12-14: qty 50

## 2022-12-14 NOTE — H&P (Signed)
History and Physical    Patient: Gary [name] WVP:[id number] DOB: [birth year] DOA: 12/14/2022 DOS: the patient was seen and examined on 12/14/2022 PCP: Patient, No Pcp Per  Patient coming from: Home  Chief Complaint:  Chief Complaint  Patient presents with   Alcohol Intoxication   HPI: Gary [name] is a 37 y.o. male with medical history significant of EtOH abuse, bipolar d/o, pancreatitis. Presenting with abdominal pain. He reports that he was in his normal state of health until 3am this morning. He felt sharp, global abdominal pain. He had N/V. He tried drinking some alcohol to ease his symptoms, but he vomited it all up. He didn't have any fever or diarrhea. When his symptoms did not improve this morning, he decided to come to the ED for evaluation. He denies any other aggravating or alleviating factors.    Review of Systems: As mentioned in the history of present illness. All other systems reviewed and are negative. Past Medical History:  Diagnosis Date   Alcohol abuse    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   Anxiety    Bipolar 2 disorder (HCC)    HIV (human immunodeficiency virus infection) ([place])    Pancreatitis    PTSD (post-traumatic stress disorder)    Schizophrenia ([place])    Seizures ([place])    Subdural hematoma (HCC)    Past Surgical History:  Procedure Laterality Date   BIOPSY  04/19/2022   Procedure: BIOPSY;  Surgeon: Irving Copas., MD;  Location: [facility] ENDOSCOPY;  Service: Gastroenterology;;   ENTEROSCOPY N/A 04/19/2022   Procedure: ENTEROSCOPY;  Surgeon: Irving Copas., MD;  Location: [facility];  Service: Gastroenterology;  Laterality: N/A;   INCISION AND DRAINAGE PERIRECTAL ABSCESS N/A 09/24/2016   Procedure: IRRIGATION AND DEBRIDEMENT PERIRECTAL ABSCESS;  Surgeon: Clayburn Pert, MD;  Location: [facility] ORS;  Service: General;  Laterality: N/A;   none     Social History:  reports that he has been smoking cigarettes. He has been smoking an average  of .5 packs per day. He has never used smokeless tobacco. He reports current alcohol use of about 105.0 standard drinks of alcohol per week. He reports current drug use. Drug: Methamphetamines.  Allergies  Allergen Reactions   Tegretol [Carbamazepine] Other (See Comments)    Caused vertigo for 2 days after taking it   Caffeine Palpitations    Family History  Problem Relation Age of Onset   Alcohol abuse Mother    Alcohol abuse Father    Colon cancer Other    Other Other    Cancer Other     Prior to Admission medications   Medication Sig Start Date End Date Taking? Authorizing Provider  bictegravir-emtricitabine-tenofovir AF (BIKTARVY) 50-200-25 MG TABS tablet Take 1 tablet by mouth daily. 10/23/22  Yes Shelly Coss, MD  levETIRAcetam (KEPPRA) 500 MG tablet Take 1 tablet (500 mg total) by mouth 2 (two) times daily. 10/23/22  Yes Shelly Coss, MD  chlordiazePOXIDE (LIBRIUM) 25 MG capsule Take 1 capsule by mouth 3 times a day for 1 day, THEN 1 capsule twice daily for 1 day, THEN 1 capsule daily for 1 day. Patient not taking: Reported on 12/14/2022 11/24/22   Lajean Saver, MD  folic acid (FOLVITE) 1 MG tablet Take 1 tablet (1 mg total) by mouth daily. Patient not taking: Reported on 12/14/2022 10/23/22   Shelly Coss, MD  thiamine (VITAMIN B1) 100 MG tablet Take 1 tablet (100 mg total) by mouth daily. Patient not taking: Reported on 12/14/2022 10/23/22   Shelly Coss,  MD  famotidine (PEPCID) 20 MG tablet Take 1 tablet (20 mg total) by mouth 2 (two) times daily. Patient not taking: Reported on 06/01/2019 05/14/19 06/02/19  Davonna Belling, MD    Physical Exam: Vitals:   12/14/22 1200 12/14/22 1205  BP:  (!) 162/104  Pulse:  99  Resp:  16  Temp: 97.9 F (36.6 C) 97.9 F (36.6 C)  TempSrc: Oral Oral  SpO2:  96%   General: 37 y.o. male resting in bed in NAD Eyes: PERRL, normal sclera ENMT: Nares patent w/o discharge, orophaynx clear, dentition normal, ears w/o  discharge/lesions/ulcers Neck: Supple, trachea midline Cardiovascular: tachy, +S1, S2, no m/g/r, equal pulses throughout Respiratory: CTABL, no w/r/r, normal WOB GI: BS+, ND, global TTP, no masses noted, no organomegaly noted MSK: No e/c/c Neuro: A&O x 3, no focal deficits Psyc: Appropriate interaction and affect, calm/cooperative  Data Reviewed:  Results for orders placed or performed during the hospital encounter of 12/14/22 (from the past 24 hour(s))  Comprehensive metabolic panel     Status: Abnormal   Collection Time: 12/14/22  1:00 PM  Result Value Ref Range   Sodium 141 135 - 145 mmol/L   Potassium 3.6 3.5 - 5.1 mmol/L   Chloride 102 98 - 111 mmol/L   CO2 25 22 - 32 mmol/L   Glucose, Bld 111 (H) 70 - 99 mg/dL   BUN 7 6 - 20 mg/dL   Creatinine, Ser 0.62 0.61 - 1.24 mg/dL   Calcium 8.3 (L) 8.9 - 10.3 mg/dL   Total Protein 7.8 6.5 - 8.1 g/dL   Albumin 4.0 3.5 - 5.0 g/dL   AST 225 (H) 15 - 41 U/L   ALT 106 (H) 0 - 44 U/L   Alkaline Phosphatase 80 38 - 126 U/L   Total Bilirubin 0.8 0.3 - 1.2 mg/dL   GFR, Estimated >60 >60 mL/min   Anion gap 14 5 - 15  CBC with Differential     Status: Abnormal   Collection Time: 12/14/22  1:00 PM  Result Value Ref Range   WBC 3.3 (L) 4.0 - 10.5 K/uL   RBC 4.23 4.22 - 5.81 MIL/uL   Hemoglobin 14.1 13.0 - 17.0 g/dL   HCT 41.6 39.0 - 52.0 %   MCV 98.3 80.0 - 100.0 fL   MCH 33.3 26.0 - 34.0 pg   MCHC 33.9 30.0 - 36.0 g/dL   RDW 14.2 11.5 - 15.5 %   Platelets 97 (L) 150 - 400 K/uL   nRBC 0.0 0.0 - 0.2 %   Neutrophils Relative % 75 %   Neutro Abs 2.5 1.7 - 7.7 K/uL   Lymphocytes Relative 18 %   Lymphs Abs 0.6 (L) 0.7 - 4.0 K/uL   Monocytes Relative 5 %   Monocytes Absolute 0.2 0.1 - 1.0 K/uL   Eosinophils Relative 1 %   Eosinophils Absolute 0.0 0.0 - 0.5 K/uL   Basophils Relative 1 %   Basophils Absolute 0.0 0.0 - 0.1 K/uL   Immature Granulocytes 0 %   Abs Immature Granulocytes 0.01 0.00 - 0.07 K/uL  Lipase, blood     Status:  Abnormal   Collection Time: 12/14/22  1:00 PM  Result Value Ref Range   Lipase 859 (H) 11 - 51 U/L  Magnesium     Status: None   Collection Time: 12/14/22  1:00 PM  Result Value Ref Range   Magnesium 2.0 1.7 - 2.4 mg/dL   CXR: 1. LEFT lobe atelectasis versus infiltrate. Findings similar to comparison  exam.  Assessment and Plan: Alcohol-induced pancreatitis     - admit to inpt, progressive     - continue fluids, pain control, anti-emetics     - NPO  except sip w/ meds and ice chips  EtOH abuse EtOH withdrawal     - CIWA, fluids     - counsel against further use  Elevated LFTs     - check hepatitis panel     - check RUQ Ab [place]  Thrombocytopenia Leukopenia     - likely secondary to EtOH abuse     - trend  Hypocalcemia     - replace Ca2+; check Mg2+, PTH, Vit D  Tobacco abuse     - counsel against further use  LLL PNA     - intermittent hypoxia on RA     - CXR as above     - started on rocephin, add azithro     - check urine legionella/strep  Seizure d/o     - continue home regimen  HIV     - continue home regimen  Advance Care Planning:   Code Status: FULL  Consults: None  Family Communication: None at bedside  Severity of Illness: The appropriate patient status for this patient is INPATIENT. Inpatient status is judged to be reasonable and necessary in order to provide the required intensity of service to ensure the patient's safety. The patient's presenting symptoms, physical exam findings, and initial radiographic and laboratory data in the context of their chronic comorbidities is felt to place them at high risk for further clinical deterioration. Furthermore, it is not anticipated that the patient will be medically stable for discharge from the hospital within 2 midnights of admission.   * I certify that at the point of admission it is my clinical judgment that the patient will require inpatient hospital care spanning beyond 2 midnights from the point of  admission due to high intensity of service, high risk for further deterioration and high frequency of surveillance required.*  Author: Jonnie Finner, DO 12/14/2022 2:25 PM  For on call review [email or web address].

## 2022-12-14 NOTE — ED Provider Notes (Signed)
[facility] AT [facility] Provider Note   CSN: [id number] Arrival date & time: 12/14/22  1145     History  Chief Complaint  Patient presents with   Alcohol Intoxication    Gary [name] is a 37 y.o. male w/ hx of chronic alcohol use disorder presented to ED with epigastric pain.  Reports last drink was earlier today.  He says he has been vomiting persistently is having severe epigastric pain.  He has been seen in the ED multiple times for similar presentations.  HPI     Home Medications Prior to Admission medications   Medication Sig Start Date End Date Taking? Authorizing Provider  bictegravir-emtricitabine-tenofovir AF (BIKTARVY) 50-200-25 MG TABS tablet Take 1 tablet by mouth daily. 10/23/22  Yes Shelly Coss, MD  levETIRAcetam (KEPPRA) 500 MG tablet Take 1 tablet (500 mg total) by mouth 2 (two) times daily. 10/23/22  Yes Shelly Coss, MD  chlordiazePOXIDE (LIBRIUM) 25 MG capsule Take 1 capsule by mouth 3 times a day for 1 day, THEN 1 capsule twice daily for 1 day, THEN 1 capsule daily for 1 day. Patient not taking: Reported on 12/14/2022 11/24/22   Lajean Saver, MD  folic acid (FOLVITE) 1 MG tablet Take 1 tablet (1 mg total) by mouth daily. Patient not taking: Reported on 12/14/2022 10/23/22   Shelly Coss, MD  thiamine (VITAMIN B1) 100 MG tablet Take 1 tablet (100 mg total) by mouth daily. Patient not taking: Reported on 12/14/2022 10/23/22   Shelly Coss, MD  famotidine (PEPCID) 20 MG tablet Take 1 tablet (20 mg total) by mouth 2 (two) times daily. Patient not taking: Reported on 06/01/2019 05/14/19 06/02/19  Davonna Belling, MD      Allergies    Tegretol [carbamazepine] and Caffeine    Review of Systems   Review of Systems  Physical Exam Updated Vital Signs BP (!) 162/104 (BP Location: Right Arm)   Pulse 99   Temp 97.9 F (36.6 C) (Oral)   Resp 16   SpO2 96%  Physical Exam Constitutional:      General: He is not in acute  distress. HENT:     Head: Normocephalic and atraumatic.  Eyes:     Conjunctiva/sclera: Conjunctivae normal.     Pupils: Pupils are equal, round, and reactive to light.  Cardiovascular:     Rate and Rhythm: Normal rate and regular rhythm.  Pulmonary:     Effort: Pulmonary effort is normal. No respiratory distress.  Abdominal:     General: There is no distension.     Tenderness: There is no abdominal tenderness.  Skin:    General: Skin is warm and dry.  Neurological:     General: No focal deficit present.     Mental Status: He is alert. Mental status is at baseline.  Psychiatric:        Mood and Affect: Mood normal.        Behavior: Behavior normal.     ED Results / Procedures / Treatments   Labs (all labs ordered are listed, but only abnormal results are displayed) Labs Reviewed  COMPREHENSIVE METABOLIC PANEL - Abnormal; Notable for the following components:      Result Value   Glucose, Bld 111 (*)    Calcium 8.3 (*)    AST 225 (*)    ALT 106 (*)    All other components within normal limits  CBC WITH DIFFERENTIAL/PLATELET - Abnormal; Notable for the following components:   WBC 3.3 (*)  Platelets 97 (*)    Lymphs Abs 0.6 (*)    All other components within normal limits  LIPASE, BLOOD - Abnormal; Notable for the following components:   Lipase 859 (*)    All other components within normal limits  MAGNESIUM    EKG None  Radiology No results found.  Procedures Procedures    Medications Ordered in ED Medications  famotidine (PEPCID) IVPB 20 mg premix (0 mg Intravenous Stopped 12/14/22 1327)  midazolam (VERSED) injection 2 mg (2 mg Intravenous Given 12/14/22 1251)  alum & mag hydroxide-simeth (MAALOX/MYLANTA) 200-200-20 MG/5ML suspension 30 mL (30 mLs Oral Given 12/14/22 1248)    And  lidocaine (XYLOCAINE) 2 % viscous mouth solution 15 mL (15 mLs Oral Given 12/14/22 1248)  sodium chloride 0.9 % bolus 1,000 mL (1,000 mLs Intravenous New Bag/Given 12/14/22 1251)   HYDROmorphone (DILAUDID) injection 1 mg (1 mg Intravenous Given 12/14/22 1358)  ondansetron (ZOFRAN) injection 4 mg (4 mg Intravenous Given 12/14/22 1358)    ED Course/ Medical Decision Making/ A&P Clinical Course as of 12/14/22 1440  Mon Dec 14, 2022  1353 Labs are consistent with acute on chronic pancreatitis, likely alcohol induced.  He also has a transaminitis which is consistent with his frequent alcohol consumption.  The patient vomited after GI cocktail still having significant pain.  IV Pepcid and Zofran ordered [MT]  1424 Admitted to hospitalist [MT]    Clinical Course User Index [MT] Jannifer Fischler, Carola Rhine, MD                             Medical Decision Making Amount and/or Complexity of Data Reviewed Labs: ordered. Radiology: ordered.  Risk OTC drugs. Prescription drug management. Decision regarding hospitalization.   This patient presents to the ED with concern for epigastric pain. This involves an extensive number of treatment options, and is a complaint that carries with it a high risk of complications and morbidity.  The differential diagnosis includes alcoholic gastritis versus gastroparesis versus pancreatitis versus other  Co-morbidities that complicate the patient evaluation: History of recurring epigastric pain and chronic alcohol use and high risk of exacerbation  External records from outside source obtained and reviewed including CT abdomen in 09/16/22 with mild residual pancreatitis  I ordered and personally interpreted labs.  The pertinent results include: Elevated lipase consistent with acute on chronic pancreatitis.  Mild transaminitis consistent with alcohol consumption.  T. bili within normal limits  The patient was maintained on a cardiac monitor.  I personally viewed and interpreted the cardiac monitored which showed an underlying rhythm of: Sinus rhythm and sinus tachycardia  I ordered medication including for suspected alcoholic gastritis vs  pancreatitis  I have reviewed the patients home medicines and have made adjustments as needed  Test Considered: Suspect is another case of acute alcoholic pancreatitis and do not see an indication for repeat CT imaging at this time.   After the interventions noted above, I reevaluated the patient and found that they have: worsened  Patient continues having intractable pain, vomiting.  Will admit for pancreatitis.  Of note the patient's oxygen level does fluctuate between 85-95% on room air, improving with conversation and effort.  He is a chronic smoker.  I will add on a chest x-ray  Social Determinants of Health: patient counseled on alcohol cessation reports "I really want to quit and I just need help".  He has been trying to quit for several months, and I asked him what  is different about this at times, and he said his pain is much worse and he realizes he needs to stop.   Dispostion:  After consideration of the diagnostic results and the patients response to treatment, I feel that the patent would benefit from medical admission          Final Clinical Impression(s) / ED Diagnoses Final diagnoses:  Alcohol-induced acute pancreatitis, unspecified complication status    Rx / DC Orders ED Discharge Orders     None         Wyvonnia Dusky, MD 12/14/22 1441

## 2022-12-14 NOTE — ED Provider Notes (Signed)
With patient's possible left-sided infiltrate and his mild leukopenia and his intermittent hypoxia I think it is reasonable to treat for pneumonia.  Rocephin and doxycycline ordered.   Wyvonnia Dusky, MD 12/14/22 [phone]

## 2022-12-14 NOTE — ED Triage Notes (Signed)
Pt BIB EMS from [facility] with alcohol W/D and abdominal pain. Reports last drink was this 15 mins, and he states he is "trying to stop drinking".   BP 146/109 O2 97% RA 101 HR  141 CBG

## 2022-12-14 NOTE — ED Notes (Signed)
Patient Gary [name] at 43.. dr Gibson Ramp said he is ok

## 2022-12-15 DIAGNOSIS — K852 Alcohol induced acute pancreatitis without necrosis or infection: Secondary | ICD-10-CM | POA: Diagnosis not present

## 2022-12-15 LAB — COMPREHENSIVE METABOLIC PANEL
ALT: 72 U/L — ABNORMAL HIGH (ref 0–44)
AST: 101 U/L — ABNORMAL HIGH (ref 15–41)
Albumin: 3.2 g/dL — ABNORMAL LOW (ref 3.5–5.0)
Alkaline Phosphatase: 63 U/L (ref 38–126)
Anion gap: 14 (ref 5–15)
BUN: <5 mg/dL — ABNORMAL LOW (ref 6–20)
CO2: 22 mmol/L (ref 22–32)
Calcium: 8.1 mg/dL — ABNORMAL LOW (ref 8.9–10.3)
Chloride: 99 mmol/L (ref 98–111)
Creatinine, Ser: 0.45 mg/dL — ABNORMAL LOW (ref 0.61–1.24)
GFR, Estimated: >60 mL/min (ref >60)
Glucose, Bld: 118 mg/dL — ABNORMAL HIGH (ref 70–99)
Potassium: 3.5 mmol/L (ref 3.5–5.1)
Sodium: 135 mmol/L (ref 135–145)
Total Bilirubin: 1.2 mg/dL (ref 0.3–1.2)
Total Protein: 6.5 g/dL (ref 6.5–8.1)

## 2022-12-15 LAB — CBC
HCT: 38.3 % — ABNORMAL LOW (ref 39.0–52.0)
Hemoglobin: 12.8 g/dL — ABNORMAL LOW (ref 13.0–17.0)
MCH: 33.3 pg (ref 26.0–34.0)
MCHC: 33.4 g/dL (ref 30.0–36.0)
MCV: 99.7 fL (ref 80.0–100.0)
Platelets: 78 10*3/uL — ABNORMAL LOW (ref 150–400)
RBC: 3.84 MIL/uL — ABNORMAL LOW (ref 4.22–5.81)
RDW: 14.1 % (ref 11.5–15.5)
WBC: 4 10*3/uL (ref 4.0–10.5)
nRBC: 0 % (ref 0.0–0.2)

## 2022-12-15 LAB — LIPID PANEL
Cholesterol: 162 mg/dL (ref 0–200)
HDL: 79 mg/dL (ref >40)
LDL Cholesterol: 73 mg/dL (ref 0–99)
Total CHOL/HDL Ratio: 2.1 RATIO
Triglycerides: 48 mg/dL (ref <150)
VLDL: 10 mg/dL (ref 0–40)

## 2022-12-15 LAB — PARATHYROID HORMONE, INTACT (NO CA): PTH: 10 pg/mL — ABNORMAL LOW (ref 15–65)

## 2022-12-15 LAB — HEMOGLOBIN A1C
Hgb A1c MFr Bld: 5.3 % (ref 4.8–5.6)
Mean Plasma Glucose: 105.41 mg/dL

## 2022-12-15 LAB — T-HELPER CELLS (CD4) COUNT (NOT AT ARMC)
CD4 % Helper T Cell: 34 % (ref 33–65)
CD4 T Cell Abs: 253 /uL — ABNORMAL LOW (ref 400–1790)

## 2022-12-15 LAB — PROCALCITONIN: Procalcitonin: <0.1 ng/mL

## 2022-12-15 MED ORDER — FOLIC ACID 5 MG/ML IJ SOLN
1.0000 mg | Freq: Every day | INTRAMUSCULAR | Status: DC
  Filled 2022-12-15: qty 0.2

## 2022-12-15 MED ORDER — HYDROMORPHONE HCL 1 MG/ML IJ SOLN
0.5000 mg | INTRAMUSCULAR | Status: DC | PRN
  Administered 2022-12-15 – 2022-12-16 (×5): 0.5 mg via INTRAVENOUS
  Filled 2022-12-15 (×4): qty 0.5

## 2022-12-15 NOTE — Plan of Care (Signed)
  Problem: Education: Goal: Knowledge of General Education information will improve Description Including pain rating scale, medication(s)/side effects and non-pharmacologic comfort measures Outcome: Progressing   Problem: Health Behavior/Discharge Planning: Goal: Ability to manage health-related needs will improve Outcome: Progressing   

## 2022-12-15 NOTE — Progress Notes (Addendum)
Progress Note   Patient: Gary [name]:[id number] DOB: 04-Nov-1986 DOA: 12/14/2022     1 DOS: the patient was seen and examined on 12/15/2022   Brief hospital course: 37 year old male with history of alcohol use disorder admitted for alcohol withdrawal, alcohol induced pancreatitis, possible pneumonia.  Assessment and Plan:  Alcohol induced pancreatitis Elevated transaminases Alcohol use disorder Per admission note she was in his usual state of health until yesterday at 3 AM developed abdominal pain.  Unfortunately able to get much history from patient himself as he was sedated this morning.  He has abdominal pain, history of alcohol use disorder and elevated lipase suggestive of acute pancreatitis.  He also has elevated EtOH at 386 on admission and will likely have alcohol withdrawal.  Elevated transaminases secondary to alcohol use. - CIWA - LR at 125 cc/h - N.p.o. except from sips with meds - Replete electrolytes - Multivitamin, folic, thiamine  Thrombocytopenia Likely due to alcohol use. - CBC daily  Tobacco use disorder - Counseling once mental status improved  Seizure disorder Etiology unknown.  He is on Keppra at home. - IV Keppra 500 mg twice daily  HIV - Continue home Biktarvy - HIV CD4 count  CODE STATUS: Full DVT prophylaxis: SCDs Lines: Peripheral IV     Subjective: NAOE. He reports abdominal pain.  History limited due to mental status.  Physical Exam: Vitals:   12/14/22 2030 12/15/22 0249 12/15/22 0606 12/15/22 0814  BP: (!) 135/96 (!) 131/92 124/89 (!) 124/98  Pulse: (!) 118 (!) 104 100 (!) 102  Resp: 20 19 18    Temp: 99.6 F (37.6 C) 99.8 F (37.7 C) 99.6 F (37.6 C)   TempSrc: Oral Oral Oral   SpO2: 93% 95% 92%    Physical Exam Vitals and nursing note reviewed.  Constitutional:      General: He is not in acute distress.    Appearance: He is not diaphoretic.  HENT:     Head: Atraumatic.  Eyes:     Pupils: Pupils are equal, round, and  reactive to light.  Cardiovascular:     Rate and Rhythm: Normal rate and regular rhythm.     Pulses: Normal pulses.     Heart sounds: No murmur heard. Pulmonary:     Effort: Pulmonary effort is normal. No respiratory distress.     Breath sounds: Normal breath sounds. No rales.  Abdominal:     General: Abdomen is flat. There is no distension.     Palpations: Abdomen is soft.     Tenderness: There is abdominal tenderness. There is no rebound.  Musculoskeletal:     Right lower leg: No edema.     Left lower leg: No edema.  Skin:    General: Skin is warm and dry.     Capillary Refill: Capillary refill takes less than 2 seconds.  Neurological:     Mental Status: He is lethargic.     Motor: Tremor present.     Comments: Intermittently follows commands.  No asterixis  Psychiatric:        Mood and Affect: Mood normal.     Data Reviewed:     Latest Ref Rng & Units 12/15/2022    3:48 AM 12/14/2022    1:00 PM 11/24/2022   12:11 AM  CBC  WBC 4.0 - 10.5 K/uL 4.0  3.3  3.0   Hemoglobin 13.0 - 17.0 g/dL 12.8  14.1  12.6   Hematocrit 39.0 - 52.0 % 38.3  41.6  36.1  Platelets 150 - 400 K/uL 78  97  166       Latest Ref Rng & Units 12/15/2022    3:48 AM 12/14/2022    1:00 PM 11/24/2022   12:11 AM  CMP  Glucose 70 - 99 mg/dL 118  111  107   BUN 6 - 20 mg/dL <5  7  <5   Creatinine 0.61 - 1.24 mg/dL 0.45  0.62  0.60   Sodium 135 - 145 mmol/L 135  141  133   Potassium 3.5 - 5.1 mmol/L 3.5  3.6  3.1   Chloride 98 - 111 mmol/L 99  102  99   CO2 22 - 32 mmol/L 22  25  21    Calcium 8.9 - 10.3 mg/dL 8.1  8.3  8.0   Total Protein 6.5 - 8.1 g/dL 6.5  7.8  6.6   Total Bilirubin 0.3 - 1.2 mg/dL 1.2  0.8  0.3   Alkaline Phos 38 - 126 U/L 63  80  69   AST 15 - 41 U/L 101  225  89   ALT 0 - 44 U/L 72  106  56    CD4 253 Lipase 859 RVP negative Hepatitis panel negative Procalcitonin less than 0.10   Family Communication: None at bedside  Disposition: Status is: Inpatient Remains inpatient  appropriate because: Acute alcohol withdrawal complicated by pancreatitis  Planned Discharge Destination: Home    Time spent: 45 minutes  Author: Lorelei Pont, MD 12/15/2022 10:43 AM  For on call review [email or web address].

## 2022-12-15 NOTE — Hospital Course (Signed)
37 year old male with history of alcohol use disorder admitted for alcohol withdrawal, alcohol induced pancreatitis, possible pneumonia.

## 2022-12-16 DIAGNOSIS — F10932 Alcohol use, unspecified with withdrawal with perceptual disturbance: Secondary | ICD-10-CM | POA: Diagnosis not present

## 2022-12-16 DIAGNOSIS — F10931 Alcohol use, unspecified with withdrawal delirium: Secondary | ICD-10-CM | POA: Insufficient documentation

## 2022-12-16 DIAGNOSIS — K852 Alcohol induced acute pancreatitis without necrosis or infection: Secondary | ICD-10-CM | POA: Diagnosis not present

## 2022-12-16 LAB — COMPREHENSIVE METABOLIC PANEL
ALT: 44 U/L (ref 0–44)
AST: 46 U/L — ABNORMAL HIGH (ref 15–41)
Albumin: 3 g/dL — ABNORMAL LOW (ref 3.5–5.0)
Alkaline Phosphatase: 58 U/L (ref 38–126)
Anion gap: 11 (ref 5–15)
BUN: <5 mg/dL — ABNORMAL LOW (ref 6–20)
CO2: 24 mmol/L (ref 22–32)
Calcium: 8.5 mg/dL — ABNORMAL LOW (ref 8.9–10.3)
Chloride: 95 mmol/L — ABNORMAL LOW (ref 98–111)
Creatinine, Ser: 0.6 mg/dL — ABNORMAL LOW (ref 0.61–1.24)
GFR, Estimated: >60 mL/min (ref >60)
Glucose, Bld: 84 mg/dL (ref 70–99)
Potassium: 3.5 mmol/L (ref 3.5–5.1)
Sodium: 130 mmol/L — ABNORMAL LOW (ref 135–145)
Total Bilirubin: 1.5 mg/dL — ABNORMAL HIGH (ref 0.3–1.2)
Total Protein: 6.2 g/dL — ABNORMAL LOW (ref 6.5–8.1)

## 2022-12-16 LAB — MISC LABCORP TEST (SEND OUT): Labcorp test code: 81950

## 2022-12-16 LAB — CBC
HCT: 39 % (ref 39.0–52.0)
Hemoglobin: 12.8 g/dL — ABNORMAL LOW (ref 13.0–17.0)
MCH: 33.1 pg (ref 26.0–34.0)
MCHC: 32.8 g/dL (ref 30.0–36.0)
MCV: 100.8 fL — ABNORMAL HIGH (ref 80.0–100.0)
Platelets: 75 10*3/uL — ABNORMAL LOW (ref 150–400)
RBC: 3.87 MIL/uL — ABNORMAL LOW (ref 4.22–5.81)
RDW: 13.7 % (ref 11.5–15.5)
WBC: 4 10*3/uL (ref 4.0–10.5)
nRBC: 0 % (ref 0.0–0.2)

## 2022-12-16 LAB — TROPONIN I (HIGH SENSITIVITY)
Troponin I (High Sensitivity): 4 ng/L (ref <18)
Troponin I (High Sensitivity): 4 ng/L (ref <18)

## 2022-12-16 LAB — MRSA NEXT GEN BY PCR, NASAL: MRSA by PCR Next Gen: NOT DETECTED

## 2022-12-16 LAB — LIPASE, BLOOD: Lipase: 328 U/L — ABNORMAL HIGH (ref 11–51)

## 2022-12-16 LAB — MAGNESIUM: Magnesium: 1.5 mg/dL — ABNORMAL LOW (ref 1.7–2.4)

## 2022-12-16 MED ORDER — PANTOPRAZOLE SODIUM 40 MG IV SOLR: 40.0000 mg | INTRAVENOUS | Status: DC

## 2022-12-16 MED ORDER — ORAL CARE MOUTH RINSE: 15.0000 mL | OROMUCOSAL | Status: DC | PRN

## 2022-12-16 MED ORDER — CHLORHEXIDINE GLUCONATE CLOTH 2 % EX PADS
6.0000 | MEDICATED_PAD | Freq: Every day | CUTANEOUS | Status: DC
  Administered 2022-12-16 – 2022-12-22 (×7): 6 via TOPICAL

## 2022-12-16 MED ORDER — DEXMEDETOMIDINE HCL IN NACL 200 MCG/50ML IV SOLN
0.2000 ug/kg/h | INTRAVENOUS | Status: DC
  Administered 2022-12-16: 0.6 ug/kg/h via INTRAVENOUS
  Administered 2022-12-16: 0.2 ug/kg/h via INTRAVENOUS
  Administered 2022-12-17 (×2): 0.6 ug/kg/h via INTRAVENOUS
  Filled 2022-12-16 (×2): qty 50
  Filled 2022-12-16: qty 100
  Filled 2022-12-16: qty 50

## 2022-12-16 MED ORDER — HALOPERIDOL LACTATE 5 MG/ML IJ SOLN
5.0000 mg | Freq: Once | INTRAMUSCULAR | Status: AC
  Administered 2022-12-16: 5 mg via INTRAVENOUS
  Filled 2022-12-16: qty 1

## 2022-12-16 MED ORDER — LORAZEPAM 1 MG PO TABS: 1.0000 mg | ORAL_TABLET | ORAL | Status: AC | PRN

## 2022-12-16 MED ORDER — MIDAZOLAM HCL 2 MG/2ML IJ SOLN: 1.0000 mg | INTRAMUSCULAR | Status: DC | PRN

## 2022-12-16 MED ORDER — ORAL CARE MOUTH RINSE
15.0000 mL | OROMUCOSAL | Status: DC
  Administered 2022-12-16 – 2022-12-21 (×7): 15 mL via OROMUCOSAL

## 2022-12-16 MED ORDER — FAMOTIDINE IN NACL 20-0.9 MG/50ML-% IV SOLN
20.0000 mg | Freq: Two times a day (BID) | INTRAVENOUS | Status: DC
  Administered 2022-12-16 – 2022-12-22 (×12): 20 mg via INTRAVENOUS
  Filled 2022-12-16 (×12): qty 50

## 2022-12-16 MED ORDER — MAGNESIUM SULFATE 4 GM/100ML IV SOLN
4.0000 g | Freq: Once | INTRAVENOUS | Status: AC
  Administered 2022-12-16: 4 g via INTRAVENOUS
  Filled 2022-12-16: qty 100

## 2022-12-16 MED ORDER — SODIUM CHLORIDE 0.9 % IV BOLUS
500.0000 mL | Freq: Once | INTRAVENOUS | Status: AC
  Administered 2022-12-16: 500 mL via INTRAVENOUS

## 2022-12-16 MED ORDER — LORAZEPAM 2 MG/ML IJ SOLN
1.0000 mg | INTRAMUSCULAR | Status: AC | PRN
  Administered 2022-12-16 (×2): 4 mg via INTRAVENOUS
  Administered 2022-12-16: 2 mg via INTRAVENOUS
  Administered 2022-12-17 (×4): 4 mg via INTRAVENOUS
  Administered 2022-12-17: 2 mg via INTRAVENOUS
  Administered 2022-12-17: 4 mg via INTRAVENOUS
  Administered 2022-12-18: 2 mg via INTRAVENOUS
  Administered 2022-12-18 (×4): 4 mg via INTRAVENOUS
  Administered 2022-12-18: 2 mg via INTRAVENOUS
  Administered 2022-12-18 – 2022-12-19 (×5): 4 mg via INTRAVENOUS
  Administered 2022-12-19: 3 mg via INTRAVENOUS
  Administered 2022-12-19 (×2): 4 mg via INTRAVENOUS
  Filled 2022-12-16 (×10): qty 2
  Filled 2022-12-16: qty 1
  Filled 2022-12-16 (×5): qty 2
  Filled 2022-12-16: qty 1
  Filled 2022-12-16 (×7): qty 2

## 2022-12-16 NOTE — Consult Note (Signed)
NAME:  Gary [name], MRN:  [id number], DOB:  1986/03/15, LOS: 2 ADMISSION DATE:  12/14/2022, CONSULTATION DATE:  2/7 REFERRING MD:  Dr. Vladimir Crofts, CHIEF COMPLAINT:  Alcohol Withdrawal   History of Present Illness:  37 year old male with PMH as below, which is significant for HIV, Bipolar disorder, schizophrenia, pancreatitis, and alcohol abuse. He reports drinking approximately one-half gallon of vodka daily. Presented to [facility] ED 2/5 with complaints of abdominal pain and vomiting. Unclear when last drink prior to arrival was. He was admitted to the hospitalist service for treatment of alcohol withdrawal, acute pancreatitis, and LLL pneumonia. He was found to have brought alcohol into the hospital as and empty small bottle of fireball was found in his room. Symptoms of alcohol withdrawal worsened prompting escalating doses of lorazepam. Despite 46mmg of Ativan the last 7 hours he continues to have outbursts prompting additional treatment. PCCM has been consulted for ICU transfer and potentially precedex infusion. Upon my evaluation the patient is complaining of pain all over including chest pain, which is worse with palpation and deep breathing.   Pertinent  Medical History   has a past medical history of Alcohol abuse, Alcohol-induced pancreatitis (04/16/2022), Anxiety, Bipolar 2 disorder ([place]), HIV (human immunodeficiency virus infection) ([place]), Pancreatitis, PTSD (post-traumatic stress disorder), Schizophrenia ([place]), Seizures ([place]), and Subdural hematoma ([place]).   Significant Hospital Events: Including procedures, antibiotic start and stop dates in addition to other pertinent events   2/5 admit 2/6 found to be drinking alcohol brought from home.  2/7 tx to ICU for closer CIWA monitoring.   Interim History / Subjective:    Objective   Blood pressure 109/82, pulse 84, temperature 97.7 F (36.5 C), temperature source Oral, resp. rate 16, SpO2 97 %.        Intake/Output Summary (Last  24 hours) at 12/16/2022 1410 Last data filed at 12/16/2022 1221 Gross per 24 hour  Intake 2977.51 ml  Output 2200 ml  Net 777.51 ml    There were no vitals filed for this visit.  Examination: General: Thin adult male in NAD HENT: [place]/AT, PERRL, no JVD Lungs: Clear bilateral breath sounds Cardiovascular: RRR, no MRG Abdomen: Hypoactive, soft, generalized tenderness.  Extremities: No acute deformity or ROM limitation Neuro: Alert, non-focal. Not oriented to time. Agitated at times.    Resolved Hospital Problem list     Assessment & Plan:   Alcohol withdrawal: Unclear last drink PTA, but has had alcohol inpatient. Alcohol level 328 on admission.  - Admit to ICU for close monitoring - Start precedex infusion for RASS goal 0 to -1 - PRN ativan - Librium taper - Folate, thiamine - No phenobarb due to biktarvy  Alcohol induced pancreatitis: Lipase 859 > 328 - NPO - LR MIVF  HIV: CD4 253 - Continue Biktarvy  Seizure disorder - continue home keppra  Thrombocytopenia: etoh - Trend CBC  Hypomag - replaced, trend  Chest pain: atypical - ECG  Best Practice (right click and "Reselect all SmartList Selections" daily)   Diet/type: NPO DVT prophylaxis: not indicated - thrombocytopenia  GI prophylaxis: PPI Lines: N/A Foley:  N/A Code Status:  full code Last date of multidisciplinary goals of care discussion [ ]   Labs   CBC: Recent Labs  Lab 12/14/22 1300 12/15/22 0348 12/16/22 0400  WBC 3.3* 4.0 4.0  NEUTROABS 2.5  --   --   HGB 14.1 12.8* 12.8*  HCT 41.6 38.3* 39.0  MCV 98.3 99.7 100.8*  PLT 97* 78* 75*  Basic Metabolic Panel: Recent Labs  Lab 12/14/22 1300 12/14/22 1726 12/15/22 0348 12/16/22 0400  NA 141  --  135 130*  K 3.6  --  3.5 3.5  CL 102  --  99 95*  CO2 25  --  22 24  GLUCOSE 111*  --  118* 84  BUN 7  --  <5* <5*  CREATININE 0.62  --  0.45* 0.60*  CALCIUM 8.3*  --  8.1* 8.5*  MG 2.0 1.7  --  1.5*    GFR: CrCl cannot be  calculated (Unknown ideal weight.). Recent Labs  Lab 12/14/22 1300 12/15/22 0348 12/15/22 0737 12/16/22 0400  PROCALCITON  --   --  <0.10  --   WBC 3.3* 4.0  --  4.0     Liver Function Tests: Recent Labs  Lab 12/14/22 1300 12/15/22 0348 12/16/22 0400  AST 225* 101* 46*  ALT 106* 72* 44  ALKPHOS 80 63 58  BILITOT 0.8 1.2 1.5*  PROT 7.8 6.5 6.2*  ALBUMIN 4.0 3.2* 3.0*    Recent Labs  Lab 12/14/22 1300 12/16/22 0400  LIPASE 859* 328*    No results for input(s): "AMMONIA" in the last 168 hours.  ABG    Component Value Date/Time   HCO3 24.8 07/15/2022 0251   TCO2 26 07/15/2022 0251   O2SAT 99 07/15/2022 0251     Coagulation Profile: No results for input(s): "INR", "PROTIME" in the last 168 hours.  Cardiac Enzymes: No results for input(s): "CKTOTAL", "CKMB", "CKMBINDEX", "TROPONINI" in the last 168 hours.  HbA1C: Hgb A1c MFr Bld  Date/Time Value Ref Range Status  12/15/2022 07:37 AM 5.3 4.8 - 5.6 % Final    Comment:    (NOTE) Pre diabetes:          5.7%-6.4%  Diabetes:              >6.4%  Glycemic control for   <7.0% adults with diabetes   07/26/2021 06:53 AM 4.3 (L) 4.8 - 5.6 % Final    Comment:    (NOTE) Pre diabetes:          5.7%-6.4%  Diabetes:              >6.4%  Glycemic control for   <7.0% adults with diabetes     CBG: No results for input(s): "GLUCAP" in the last 168 hours.  Review of Systems:   Bolds are positive  Constitutional: weight loss, gain, night sweats, Fevers, chills, fatigue .  HEENT: headaches, Sore throat, sneezing, nasal congestion, post nasal drip, Difficulty swallowing, Tooth/dental problems, visual complaints visual changes, ear ache CV:  chest pain, radiates: ,Orthopnea, PND, swelling in lower extremities, dizziness, palpitations, syncope.  GI  heartburn, indigestion, abdominal pain, nausea, vomiting, diarrhea, change in bowel habits, loss of appetite, bloody stools.  Resp: cough, productive: , hemoptysis,  dyspnea, chest pain, pleuritic.  Skin: rash or itching or icterus GU: dysuria, change in color of urine, urgency or frequency. flank pain, hematuria  MS: joint pain or swelling. decreased range of motion  Psych: change in mood or affect. depression or anxiety.  Neuro: difficulty with speech, weakness, numbness, ataxia    Past Medical History:  He,  has a past medical history of Alcohol abuse, Alcohol-induced pancreatitis (04/16/2022), Anxiety, Bipolar 2 disorder ([place]), HIV (human immunodeficiency virus infection) ([place]), Pancreatitis, PTSD (post-traumatic stress disorder), Schizophrenia ([place]), Seizures ([place]), and Subdural hematoma ([place]).   Surgical History:   Past Surgical History:  Procedure Laterality Date   BIOPSY  04/19/2022   Procedure: BIOPSY;  Surgeon: Irving Copas., MD;  Location: [facility];  Service: Gastroenterology;;   ENTEROSCOPY N/A 04/19/2022   Procedure: ENTEROSCOPY;  Surgeon: Rush Landmark Telford Nab., MD;  Location: [facility];  Service: Gastroenterology;  Laterality: N/A;   INCISION AND DRAINAGE PERIRECTAL ABSCESS N/A 09/24/2016   Procedure: IRRIGATION AND DEBRIDEMENT PERIRECTAL ABSCESS;  Surgeon: Clayburn Pert, MD;  Location: [facility];  Service: General;  Laterality: N/A;   none       Social History:   reports that he has been smoking cigarettes. He has been smoking an average of .5 packs per day. He has never used smokeless tobacco. He reports current alcohol use of about 105.0 standard drinks of alcohol per week. He reports current drug use. Drug: Methamphetamines.   Family History:  His family history includes Alcohol abuse in his father and mother; Cancer in an other family member; Colon cancer in an other family member; Other in an other family member.   Allergies Allergies  Allergen Reactions   Tegretol [Carbamazepine] Other (See Comments)    Caused vertigo for 2 days after taking it   Caffeine Palpitations     Home Medications  Prior to  Admission medications   Medication Sig Start Date End Date Taking? Authorizing Provider  bictegravir-emtricitabine-tenofovir AF (BIKTARVY) 50-200-25 MG TABS tablet Take 1 tablet by mouth daily. 10/23/22  Yes Shelly Coss, MD  levETIRAcetam (KEPPRA) 500 MG tablet Take 1 tablet (500 mg total) by mouth 2 (two) times daily. 10/23/22  Yes Shelly Coss, MD  chlordiazePOXIDE (LIBRIUM) 25 MG capsule Take 1 capsule by mouth 3 times a day for 1 day, THEN 1 capsule twice daily for 1 day, THEN 1 capsule daily for 1 day. Patient not taking: Reported on 12/14/2022 11/24/22   Lajean Saver, MD  folic acid (FOLVITE) 1 MG tablet Take 1 tablet (1 mg total) by mouth daily. Patient not taking: Reported on 12/14/2022 10/23/22   Shelly Coss, MD  thiamine (VITAMIN B1) 100 MG tablet Take 1 tablet (100 mg total) by mouth daily. Patient not taking: Reported on 12/14/2022 10/23/22   Shelly Coss, MD  famotidine (PEPCID) 20 MG tablet Take 1 tablet (20 mg total) by mouth 2 (two) times daily. Patient not taking: Reported on 06/01/2019 05/14/19 06/02/19  Davonna Belling, MD     Critical care time: 37 minutes     Georgann Housekeeper, AGACNP-BC [place] for personal pager PCCM on call pager [phone] until 7pm. Please call Elink 7p-7a. [phone]  12/16/2022 2:10 PM

## 2022-12-16 NOTE — H&P (Deleted)
NAME:  Gary [name], MRN:  [id number], DOB:  07-13-1986, LOS: 2 ADMISSION DATE:  12/14/2022, CONSULTATION DATE:  2/7 REFERRING MD:  Dr. Vladimir Crofts, CHIEF COMPLAINT:  Alcohol Withdrawal   History of Present Illness:  37 year old male with PMH as below, which is significant for HIV, Bipolar disorder, schizophrenia, pancreatitis, and alcohol abuse. He reports drinking approximately one-half gallon of vodka daily. Presented to [facility] ED 2/5 with complaints of abdominal pain and vomiting. Unclear when last drink prior to arrival was. He was admitted to the hospitalist service for treatment of alcohol withdrawal, acute pancreatitis, and LLL pneumonia. He was found to have brought alcohol into the hospital as and empty small bottle of fireball was found in his room. Symptoms of alcohol withdrawal worsened prompting escalating doses of lorazepam. Despite 7mmg of Ativan the last 7 hours he continues to have outbursts prompting additional treatment. PCCM has been consulted for ICU transfer and potentially precedex infusion. Upon my evaluation the patient is complaining of pain all over including chest pain, which is worse with palpation and deep breathing.   Pertinent  Medical History   has a past medical history of Alcohol abuse, Alcohol-induced pancreatitis (04/16/2022), Anxiety, Bipolar 2 disorder ([place]), HIV (human immunodeficiency virus infection) ([place]), Pancreatitis, PTSD (post-traumatic stress disorder), Schizophrenia ([place]), Seizures ([place]), and Subdural hematoma ([place]).   Significant Hospital Events: Including procedures, antibiotic start and stop dates in addition to other pertinent events   2/5 admit 2/6 found to be drinking alcohol brought from home.  2/7 tx to ICU for closer CIWA monitoring.   Interim History / Subjective:    Objective   Blood pressure 129/86, pulse 84, temperature 97.7 F (36.5 C), temperature source Oral, resp. rate 16, SpO2 97 %.        Intake/Output Summary (Last  24 hours) at 12/16/2022 1330 Last data filed at 12/16/2022 1221 Gross per 24 hour  Intake 2977.51 ml  Output 2550 ml  Net 427.51 ml   There were no vitals filed for this visit.  Examination: General: Thin adult male in NAD HENT: [place]/AT, PERRL, no JVD Lungs: Clear bilateral breath sounds Cardiovascular: RRR, no MRG Abdomen: Hypoactive, soft, generalized tenderness.  Extremities: No acute deformity or ROM limitation Neuro: Alert, non-focal. Not oriented to time. Agitated at times.    Resolved Hospital Problem list     Assessment & Plan:   Alcohol withdrawal: Unclear last drink PTA, but has had alcohol inpatient. Alcohol level 328 on admission.  - Admit to ICU for close monitoring - Start precedex infusion for RASS goal 0 to -1 - PRN ativan - Librium taper - Folate, thiamine - No phenobarb due to biktarvy  Alcohol induced pancreatitis: Lipase 859 > 328 - NPO - LR MIVF  HIV: CD4 253 - Continue Biktarvy  Seizure disorder - continue home keppra  Thrombocytopenia: etoh - Trend CBC  Hypomag - replaced, trend  Chest pain: atypical - ECG  Best Practice (right click and "Reselect all SmartList Selections" daily)   Diet/type: NPO DVT prophylaxis: not indicated - thrombocytopenia  GI prophylaxis: PPI Lines: N/A Foley:  N/A Code Status:  full code Last date of multidisciplinary goals of care discussion [ ]   Labs   CBC: Recent Labs  Lab 12/14/22 1300 12/15/22 0348 12/16/22 0400  WBC 3.3* 4.0 4.0  NEUTROABS 2.5  --   --   HGB 14.1 12.8* 12.8*  HCT 41.6 38.3* 39.0  MCV 98.3 99.7 100.8*  PLT 97* 78* 75*  Basic Metabolic Panel: Recent Labs  Lab 12/14/22 1300 12/14/22 1726 12/15/22 0348 12/16/22 0400  NA 141  --  135 130*  K 3.6  --  3.5 3.5  CL 102  --  99 95*  CO2 25  --  22 24  GLUCOSE 111*  --  118* 84  BUN 7  --  <5* <5*  CREATININE 0.62  --  0.45* 0.60*  CALCIUM 8.3*  --  8.1* 8.5*  MG 2.0 1.7  --  1.5*   GFR: CrCl cannot be calculated  (Unknown ideal weight.). Recent Labs  Lab 12/14/22 1300 12/15/22 0348 12/15/22 0737 12/16/22 0400  PROCALCITON  --   --  <0.10  --   WBC 3.3* 4.0  --  4.0    Liver Function Tests: Recent Labs  Lab 12/14/22 1300 12/15/22 0348 12/16/22 0400  AST 225* 101* 46*  ALT 106* 72* 44  ALKPHOS 80 63 58  BILITOT 0.8 1.2 1.5*  PROT 7.8 6.5 6.2*  ALBUMIN 4.0 3.2* 3.0*   Recent Labs  Lab 12/14/22 1300 12/16/22 0400  LIPASE 859* 328*   No results for input(s): "AMMONIA" in the last 168 hours.  ABG    Component Value Date/Time   HCO3 24.8 07/15/2022 0251   TCO2 26 07/15/2022 0251   O2SAT 99 07/15/2022 0251     Coagulation Profile: No results for input(s): "INR", "PROTIME" in the last 168 hours.  Cardiac Enzymes: No results for input(s): "CKTOTAL", "CKMB", "CKMBINDEX", "TROPONINI" in the last 168 hours.  HbA1C: Hgb A1c MFr Bld  Date/Time Value Ref Range Status  12/15/2022 07:37 AM 5.3 4.8 - 5.6 % Final    Comment:    (NOTE) Pre diabetes:          5.7%-6.4%  Diabetes:              >6.4%  Glycemic control for   <7.0% adults with diabetes   07/26/2021 06:53 AM 4.3 (L) 4.8 - 5.6 % Final    Comment:    (NOTE) Pre diabetes:          5.7%-6.4%  Diabetes:              >6.4%  Glycemic control for   <7.0% adults with diabetes     CBG: No results for input(s): "GLUCAP" in the last 168 hours.  Review of Systems:   Bolds are positive  Constitutional: weight loss, gain, night sweats, Fevers, chills, fatigue .  HEENT: headaches, Sore throat, sneezing, nasal congestion, post nasal drip, Difficulty swallowing, Tooth/dental problems, visual complaints visual changes, ear ache CV:  chest pain, radiates: ,Orthopnea, PND, swelling in lower extremities, dizziness, palpitations, syncope.  GI  heartburn, indigestion, abdominal pain, nausea, vomiting, diarrhea, change in bowel habits, loss of appetite, bloody stools.  Resp: cough, productive: , hemoptysis, dyspnea, chest pain,  pleuritic.  Skin: rash or itching or icterus GU: dysuria, change in color of urine, urgency or frequency. flank pain, hematuria  MS: joint pain or swelling. decreased range of motion  Psych: change in mood or affect. depression or anxiety.  Neuro: difficulty with speech, weakness, numbness, ataxia    Past Medical History:  He,  has a past medical history of Alcohol abuse, Alcohol-induced pancreatitis (04/16/2022), Anxiety, Bipolar 2 disorder ([place]), HIV (human immunodeficiency virus infection) ([place]), Pancreatitis, PTSD (post-traumatic stress disorder), Schizophrenia ([place]), Seizures ([place]), and Subdural hematoma ([place]).   Surgical History:   Past Surgical History:  Procedure Laterality Date   BIOPSY  04/19/2022   Procedure:  BIOPSY;  Surgeon: Irving Copas., MD;  Location: [place];  Service: Gastroenterology;;   ENTEROSCOPY N/A 04/19/2022   Procedure: ENTEROSCOPY;  Surgeon: Rush Landmark Telford Nab., MD;  Location: [facility];  Service: Gastroenterology;  Laterality: N/A;   INCISION AND DRAINAGE PERIRECTAL ABSCESS N/A 09/24/2016   Procedure: IRRIGATION AND DEBRIDEMENT PERIRECTAL ABSCESS;  Surgeon: Clayburn Pert, MD;  Location: [facility];  Service: General;  Laterality: N/A;   none       Social History:   reports that he has been smoking cigarettes. He has been smoking an average of .5 packs per day. He has never used smokeless tobacco. He reports current alcohol use of about 105.0 standard drinks of alcohol per week. He reports current drug use. Drug: Methamphetamines.   Family History:  His family history includes Alcohol abuse in his father and mother; Cancer in an other family member; Colon cancer in an other family member; Other in an other family member.   Allergies Allergies  Allergen Reactions   Tegretol [Carbamazepine] Other (See Comments)    Caused vertigo for 2 days after taking it   Caffeine Palpitations     Home Medications  Prior to Admission medications    Medication Sig Start Date End Date Taking? Authorizing Provider  bictegravir-emtricitabine-tenofovir AF (BIKTARVY) 50-200-25 MG TABS tablet Take 1 tablet by mouth daily. 10/23/22  Yes Shelly Coss, MD  levETIRAcetam (KEPPRA) 500 MG tablet Take 1 tablet (500 mg total) by mouth 2 (two) times daily. 10/23/22  Yes Shelly Coss, MD  chlordiazePOXIDE (LIBRIUM) 25 MG capsule Take 1 capsule by mouth 3 times a day for 1 day, THEN 1 capsule twice daily for 1 day, THEN 1 capsule daily for 1 day. Patient not taking: Reported on 12/14/2022 11/24/22   Lajean Saver, MD  folic acid (FOLVITE) 1 MG tablet Take 1 tablet (1 mg total) by mouth daily. Patient not taking: Reported on 12/14/2022 10/23/22   Shelly Coss, MD  thiamine (VITAMIN B1) 100 MG tablet Take 1 tablet (100 mg total) by mouth daily. Patient not taking: Reported on 12/14/2022 10/23/22   Shelly Coss, MD  famotidine (PEPCID) 20 MG tablet Take 1 tablet (20 mg total) by mouth 2 (two) times daily. Patient not taking: Reported on 06/01/2019 05/14/19 06/02/19  Davonna Belling, MD     Critical care time: 37 minutes     Georgann Housekeeper, AGACNP-BC Impact for personal pager PCCM on call pager [phone] until 7pm. Please call Elink 7p-7a. [phone]  12/16/2022 1:48 PM

## 2022-12-16 NOTE — Plan of Care (Signed)
  Problem: Education: Goal: Knowledge of General Education information will improve Description Including pain rating scale, medication(s)/side effects and non-pharmacologic comfort measures Outcome: Progressing   Problem: Health Behavior/Discharge Planning: Goal: Ability to manage health-related needs will improve Outcome: Progressing   

## 2022-12-16 NOTE — Progress Notes (Signed)
PROGRESS NOTE   JAXSUN CIAMPI  KKX:[id number]    DOB: 16-Feb-1986    DOA: 12/14/2022  PCP: Patient, No Pcp Per   I have briefly reviewed patients previous medical records in [facility].  Chief Complaint  Patient presents with   Alcohol Intoxication    Brief Narrative:  37 year old male with PMH of alcohol use disorder, alcohol induced pancreatitis, bipolar 2 disorder/anxiety/PTSD/schizophrenia, HIV, seizures, presented to the ED on 12/14/2022 with complaints of abdominal pain, nausea and vomiting.  Blood alcohol level 386 and lipase 859 on admission.  Admitted for suspected alcohol induced pancreatitis.  Course complicated by alcohol withdrawal with delirium, worsened 2/7 despite CIWA protocol, consulted PCCM and transfer to stepdown.  May need Precedex drip.  At risk for DTs.   Assessment & Plan:  Principal Problem:   Alcohol-induced pancreatitis Active Problems:   Cigarette smoker   History of seizures   Hypocalcemia   HIV disease (HCC)   Thrombocytopenia (HCC)   Alcohol abuse   Alcohol withdrawal (HCC)   Elevated LFTs   Leukopenia   CAP (community acquired pneumonia)   Alcohol induced pancreatitis: Appears to be continuing to drink heavily.  BAL 386 and lipase 859 on admission.  No imaging done to evaluate pancreatitis.  Treated with bowel rest/n.p.o., IV fluids and pain management.  Lipase down to 328, transaminitis has improved, patient reported abdominal pain is better and after initiated clear liquid diet.  Continue IV fluids and supportive care.  Advance diet as tolerated.  RUQ ultrasound with hepatic steatosis.  Alcohol use disorder/alcohol intoxication/alcohol withdrawal and at risk for DTs: Unclear as to exact amount of alcohol consumption at home.  Ongoing withdrawal with high CIWA scores in the last 24 hours up to 21 this afternoon.  Patient confused, intermittently agitated, getting out of bed, asking to be discharged.  PCCM consulted.  Transferred to stepdown  orders placed.  Monitor closely.  May need Precedex drip.  Continue thiamine, folate and multivitamins.  Minimize opioids.  Elevated LFTs Acute hepatitis panel negative.  Likely related to alcohol use disorder.  AST and ALT have nearly normalized.  Total bilirubin up slightly to 1.5.   Thrombocytopenia Secondary to alcohol.  Stable compared to yesterday.  No bleeding reported.  Leukopenia Secondary to alcohol toxic effects.  Resolved.   Hypomagnesemia: Magnesium 1.5 on 2/7.  Replace IV aggressively and follow in AM.  Hypocalcemia Intact PTH low at 10.  Serum calcium 8.5 and albumin of 3 thereby corrected serum calcium is 9.3/normal.   Tobacco abuse Cessation counseling to be done when more awake and coherent.   Left lobe atelectasis Less likely pneumonia.  Chest x-ray findings similar to prior from 12/31 per radiology report.  Moreover procalcitonin is negative.  RVP, influenza a and B, COVID-19 testing negative.  Empirically started antibiotics were discontinued.  Incentive spirometry.  Will need follow-up chest x-ray in 4 weeks.  Pneumonia ruled out.  However he is at risk for aspiration from AMS.   Seizure d/o Remains on IV Keppra 500 Mg every 12 hours.   HIV Continue Biktarvy.  CD4 count 253.  Bipolar disorder Currently AMS due to alcohol withdrawal.  There is no height or weight on file to calculate BMI.     DVT prophylaxis: SCDs Start: 12/14/22 1713     Code Status: Full Code:  Family Communication: None at bedside Disposition:  Status is: Inpatient Remains inpatient appropriate because: Significant alcohol withdrawal with risk for further deterioration.     Consultants:  PCCM  Procedures:     Antimicrobials:      Subjective:  Seen this morning.  Somnolent but easily arousable.  Oriented to self and Lasix.  When asked why he was in the hospital, states that "wanted to make some easy money".  Indicated abdominal pain was better.  Overall poor historian.   As per nursing, ongoing high CIWA scores and confusion this morning.  She then reported this afternoon that patient was confused, somewhat agitated, attempting to get out of bed.  Objective:   Vitals:   12/16/22 0348 12/16/22 0825 12/16/22 1100 12/16/22 1232  BP: 121/89 110/75 114/82 129/86  Pulse: 94 85 89 84  Resp: 17   16  Temp: 98.9 F (37.2 C)   97.7 F (36.5 C)  TempSrc:    Oral  SpO2: 94%   97%    General exam: Young male, moderately built and nourished lying comfortably supine in bed without distress this morning.  Oral mucosa with borderline hydration. Respiratory system: Clear to auscultation. Respiratory effort normal. Cardiovascular system: S1 & S2 heard, RRR. No JVD, murmurs, rubs, gallops or clicks. No pedal edema.  Telemetry personally reviewed: Sinus rhythm.?  Episode of SVT versus atrial flutter with 2 and 1 block at 140 bpm at 9:13 PM on 2/6. Gastrointestinal system: Abdomen is nondistended, soft and nontender. No organomegaly or masses felt. Normal bowel sounds heard. Central nervous system: Mental status as noted above. No focal neurological deficits. Extremities: Symmetric 5 x 5 power.  Some finger tremulousness. Skin: No rashes, lesions or ulcers Psychiatry: Judgement and insight impaired l. Mood & affect cannot be assessed at this time.     Data Reviewed:   I have personally reviewed following labs and imaging studies   CBC: Recent Labs  Lab 12/14/22 1300 12/15/22 0348 12/16/22 0400  WBC 3.3* 4.0 4.0  NEUTROABS 2.5  --   --   HGB 14.1 12.8* 12.8*  HCT 41.6 38.3* 39.0  MCV 98.3 99.7 100.8*  PLT 97* 78* 75*    Basic Metabolic Panel: Recent Labs  Lab 12/14/22 1300 12/14/22 1726 12/15/22 0348 12/16/22 0400  NA 141  --  135 130*  K 3.6  --  3.5 3.5  CL 102  --  99 95*  CO2 25  --  22 24  GLUCOSE 111*  --  118* 84  BUN 7  --  <5* <5*  CREATININE 0.62  --  0.45* 0.60*  CALCIUM 8.3*  --  8.1* 8.5*  MG 2.0 1.7  --  1.5*    Liver Function  Tests: Recent Labs  Lab 12/14/22 1300 12/15/22 0348 12/16/22 0400  AST 225* 101* 46*  ALT 106* 72* 44  ALKPHOS 80 63 58  BILITOT 0.8 1.2 1.5*  PROT 7.8 6.5 6.2*  ALBUMIN 4.0 3.2* 3.0*    CBG: No results for input(s): "GLUCAP" in the last 168 hours.  Microbiology Studies:   Recent Results (from the past 240 hour(s))  Resp panel by RT-PCR (RSV, Flu A&B, Covid) Anterior Nasal Swab     Status: None   Collection Time: 12/14/22  4:11 PM   Specimen: Anterior Nasal Swab  Result Value Ref Range Status   SARS Coronavirus 2 by RT PCR NEGATIVE NEGATIVE Final    Comment: (NOTE) SARS-CoV-2 target nucleic acids are NOT DETECTED.  The SARS-CoV-2 RNA is generally detectable in upper respiratory specimens during the acute phase of infection. The lowest concentration of SARS-CoV-2 viral copies this assay can detect is 138 copies/mL.  A negative result does not preclude SARS-Cov-2 infection and should not be used as the sole basis for treatment or other patient management decisions. A negative result may occur with  improper specimen collection/handling, submission of specimen other than nasopharyngeal swab, presence of viral mutation(s) within the areas targeted by this assay, and inadequate number of viral copies(<138 copies/mL). A negative result must be combined with clinical observations, patient history, and epidemiological information. The expected result is Negative.  Fact Sheet for Patients:  EntrepreneurPulse.com.au  Fact Sheet for Healthcare Providers:  IncredibleEmployment.be  This test is no t yet approved or cleared by the [place] FDA and  has been authorized for detection and/or diagnosis of SARS-CoV-2 by FDA under an Emergency Use Authorization (EUA). This EUA will remain  in effect (meaning this test can be used) for the duration of the COVID-19 declaration under Section 564(b)(1) of the Act, 21 U.S.C.section 360bbb-3(b)(1),  unless the authorization is terminated  or revoked sooner.       Influenza A by PCR NEGATIVE NEGATIVE Final   Influenza B by PCR NEGATIVE NEGATIVE Final    Comment: (NOTE) The Xpert Xpress SARS-CoV-2/FLU/RSV plus assay is intended as an aid in the diagnosis of influenza from Nasopharyngeal swab specimens and should not be used as a sole basis for treatment. Nasal washings and aspirates are unacceptable for Xpert Xpress SARS-CoV-2/FLU/RSV testing.  Fact Sheet for Patients: EntrepreneurPulse.com.au  Fact Sheet for Healthcare Providers: IncredibleEmployment.be  This test is not yet approved or cleared by the [place] FDA and has been authorized for detection and/or diagnosis of SARS-CoV-2 by FDA under an Emergency Use Authorization (EUA). This EUA will remain in effect (meaning this test can be used) for the duration of the COVID-19 declaration under Section 564(b)(1) of the Act, 21 U.S.C. section 360bbb-3(b)(1), unless the authorization is terminated or revoked.     Resp Syncytial Virus by PCR NEGATIVE NEGATIVE Final    Comment: (NOTE) Fact Sheet for Patients: EntrepreneurPulse.com.au  Fact Sheet for Healthcare Providers: IncredibleEmployment.be  This test is not yet approved or cleared by the [place] FDA and has been authorized for detection and/or diagnosis of SARS-CoV-2 by FDA under an Emergency Use Authorization (EUA). This EUA will remain in effect (meaning this test can be used) for the duration of the COVID-19 declaration under Section 564(b)(1) of the Act, 21 U.S.C. section 360bbb-3(b)(1), unless the authorization is terminated or revoked.  Performed at [facility], [place] [address], [place], [place] [address]     Radiology Studies:  US Abdomen Limited RUQ (LIVER/GB)  Result Date: 12/14/2022 CLINICAL DATA:  Elevated LFT EXAM: ULTRASOUND ABDOMEN LIMITED RIGHT  UPPER QUADRANT COMPARISON:  CT 09/16/2022 FINDINGS: Gallbladder: No gallstones or wall thickening visualized. No sonographic Murphy sign noted by sonographer. Common bile duct: Diameter: 4.6 mm Liver: Echogenic liver parenchyma. No focal hepatic abnormality. Portal vein is patent on color Doppler imaging with normal direction of blood flow towards the liver. Other: Small free fluid in the right upper quadrant IMPRESSION: 1. Negative for gallstones. 2. Echogenic liver parenchyma consistent with hepatic steatosis 3. Small amount of free fluid in the right upper quadrant. Electronically Signed   By: Donavan Foil M.D.   On: 12/14/2022 19:34   DG Chest Portable 1 View  Result Date: 12/14/2022 CLINICAL DATA:  Hypoxia EXAM: PORTABLE CHEST 1 VIEW COMPARISON:  11/08/2022 FINDINGS: Normal cardiac silhouette. Chronic elevation of LEFT hemidiaphragm. There is patchy airspace disease in the LEFT lower lobe. RIGHT lung clear. No pneumothorax.  No fracture. IMPRESSION: 1. LEFT lobe atelectasis versus infiltrate. Findings similar to comparison exam. Electronically Signed   By: Genevive Bi M.D.   On: 12/14/2022 14:43    Scheduled Meds:    bictegravir-emtricitabine-tenofovir AF  1 tablet Oral Daily   folic acid  1 mg Oral Daily   LORazepam  0-4 mg Intravenous Q4H   Followed by   LORazepam  0-4 mg Intravenous Q8H   multivitamin with minerals  1 tablet Oral Daily   thiamine  100 mg Oral Daily   Or   thiamine  100 mg Intravenous Daily    Continuous Infusions:    lactated ringers 125 mL/hr at 12/16/22 1221   levETIRAcetam Stopped (12/16/22 1309)     LOS: 2 days     Marcellus Scott, MD,  FACP, FHM, [facility], [facility], [facility]   [facility] [place]     To contact the attending provider between 7A-7P or the covering provider during after hours 7P-7A, please log into the web site [email or web address] and access using universal [place] password for that web site. If you do not  have the password, please call the hospital operator.  12/16/2022, 1:32 PM

## 2022-12-16 NOTE — TOC Initial Note (Signed)
Transition of Care [facility]) - Initial/Assessment Note    Patient Details  Name: Gary [name] MRN: [id number] Date of Birth: [birth year]  [facility]) CM/SW Contact:    Illene Regulus, LCSW Phone Number: 12/16/2022, 11:34 AM  Clinical Narrative:                  Substance abuse resources are attached to pt AVS. TOC to follow for d/c needs.        Patient Goals and CMS Choice            Expected Discharge Plan and Services                                              Prior Living Arrangements/Services                       Activities of Daily Living      Permission Sought/Granted                  Emotional Assessment              Admission diagnosis:  Alcohol-induced pancreatitis [K85.20] Pneumonia of left lower lobe due to infectious organism [J18.9] Alcohol-induced acute pancreatitis, unspecified complication status [O35.00] Patient Active Problem List   Diagnosis Date Noted   Leukopenia 12/14/2022   CAP (community acquired pneumonia) 12/14/2022   Alcohol-induced pancreatitis 12/14/2022   Delirium tremens ([place]) 10/19/2022   Alcohol abuse with alcohol-induced mood disorder ([place]) 10/16/2022   Alcohol dependence with withdrawal ([place]) 10/16/2022   Elevated LFTs 93/81/8299   Acute alcoholic pancreatitis 37/16/9678   Alcohol withdrawal ([place]) 05/29/2022   AKI (acute kidney injury) ([place]) 05/28/2022   Homelessness 05/28/2022   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   Metabolic acidosis with increased anion gap and accumulation of organic acids 04/16/2022   History of seizures 04/16/2022   Tobacco abuse 04/16/2022   Macrocytosis 12/31/2021   Alcohol-induced insomnia ([place])    MDD (major depressive disorder), recurrent episode, severe ([place]) 07/26/2021   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   Alcohol abuse    Substance induced mood disorder ([place]) 06/08/2020   Malnutrition of moderate degree 09/02/8526   Alcoholic ketoacidosis  78/24/2353   Thrombocytopenia ([place]) 07/25/2019   Transaminitis 07/25/2019   Traumatic subdural hematoma ([place]) 07/02/2019   Alcohol intoxication with moderate or severe use disorder, with unspecified complication ([place]) 61/44/3154   Hypocalcemia 07/02/2019   Alcohol-induced mood disorder ([place]) 05/13/2019   Alcohol use disorder, severe, dependence ([place]) 04/16/2019   Major depressive disorder, recurrent severe without psychotic features ([place]) 04/16/2019   HIV disease ([place]) 06/16/2018   Polysubstance abuse ([place]) 06/16/2018   Cigarette smoker 06/16/2018   PCP:  Patient, No Pcp Per Pharmacy:   [facility] [place] [address] Phone: [phone] Fax: [phone]     Social Determinants of Health (SDOH) Social History: [place]: Food Insecurity Present (10/23/2022)  Housing: High Risk (10/23/2022)  Transportation Needs: Unmet Transportation Needs (10/23/2022)  Utilities: Not At Risk (10/17/2022)  Alcohol Screen: High Risk (07/25/2021)  Depression (PHQ2-9): Medium Risk (02/12/2021)  Tobacco Use: High Risk (11/25/2022)   SDOH Interventions:     Readmission Risk Interventions    10/23/2022   11:31 AM 10/23/2022    9:05 AM 06/01/2022    1:14 PM  Readmission  Risk Prevention Plan  Transportation Screening Complete Complete Complete  Medication Review Press photographer) Complete Complete Referral to Pharmacy  PCP or Specialist appointment within 3-5 days of discharge  Complete Complete  HRI or Home Care Consult  Complete Complete  SW Recovery Care/Counseling Consult  Complete Complete  Palliative Care Screening  Not Applicable Not [place]  Not Applicable Not Applicable

## 2022-12-17 DIAGNOSIS — K852 Alcohol induced acute pancreatitis without necrosis or infection: Secondary | ICD-10-CM | POA: Diagnosis not present

## 2022-12-17 DIAGNOSIS — F10932 Alcohol use, unspecified with withdrawal with perceptual disturbance: Secondary | ICD-10-CM | POA: Diagnosis not present

## 2022-12-17 DIAGNOSIS — G40909 Epilepsy, unspecified, not intractable, without status epilepticus: Secondary | ICD-10-CM

## 2022-12-17 LAB — COMPREHENSIVE METABOLIC PANEL
ALT: 40 U/L (ref 0–44)
AST: 52 U/L — ABNORMAL HIGH (ref 15–41)
Albumin: 3 g/dL — ABNORMAL LOW (ref 3.5–5.0)
Alkaline Phosphatase: 64 U/L (ref 38–126)
Anion gap: 12 (ref 5–15)
BUN: <5 mg/dL — ABNORMAL LOW (ref 6–20)
CO2: 23 mmol/L (ref 22–32)
Calcium: 8.7 mg/dL — ABNORMAL LOW (ref 8.9–10.3)
Chloride: 101 mmol/L (ref 98–111)
Creatinine, Ser: 0.5 mg/dL — ABNORMAL LOW (ref 0.61–1.24)
GFR, Estimated: >60 mL/min (ref >60)
Glucose, Bld: 86 mg/dL (ref 70–99)
Potassium: 3.5 mmol/L (ref 3.5–5.1)
Sodium: 136 mmol/L (ref 135–145)
Total Bilirubin: 1.4 mg/dL — ABNORMAL HIGH (ref 0.3–1.2)
Total Protein: 6 g/dL — ABNORMAL LOW (ref 6.5–8.1)

## 2022-12-17 LAB — AMMONIA: Ammonia: 30 umol/L (ref 9–35)

## 2022-12-17 LAB — CBC
HCT: 38.3 % — ABNORMAL LOW (ref 39.0–52.0)
Hemoglobin: 12.5 g/dL — ABNORMAL LOW (ref 13.0–17.0)
MCH: 33.7 pg (ref 26.0–34.0)
MCHC: 32.6 g/dL (ref 30.0–36.0)
MCV: 103.2 fL — ABNORMAL HIGH (ref 80.0–100.0)
Platelets: 84 10*3/uL — ABNORMAL LOW (ref 150–400)
RBC: 3.71 MIL/uL — ABNORMAL LOW (ref 4.22–5.81)
RDW: 13.6 % (ref 11.5–15.5)
WBC: 3.3 10*3/uL — ABNORMAL LOW (ref 4.0–10.5)
nRBC: 0 % (ref 0.0–0.2)

## 2022-12-17 LAB — MAGNESIUM: Magnesium: 2.1 mg/dL (ref 1.7–2.4)

## 2022-12-17 MED ORDER — DEXMEDETOMIDINE HCL IN NACL 200 MCG/50ML IV SOLN
0.0000 ug/kg/h | INTRAVENOUS | Status: DC
  Administered 2022-12-17: 0.8 ug/kg/h via INTRAVENOUS
  Administered 2022-12-17: 1.1 ug/kg/h via INTRAVENOUS
  Filled 2022-12-17: qty 50

## 2022-12-17 MED ORDER — DEXMEDETOMIDINE HCL IN NACL 400 MCG/100ML IV SOLN
0.0000 ug/kg/h | INTRAVENOUS | Status: DC
  Administered 2022-12-17: 1.1 ug/kg/h via INTRAVENOUS
  Administered 2022-12-17: 1 ug/kg/h via INTRAVENOUS
  Administered 2022-12-18: 1.2 ug/kg/h via INTRAVENOUS
  Administered 2022-12-18 (×2): 0.8 ug/kg/h via INTRAVENOUS
  Administered 2022-12-19: 1.2 ug/kg/h via INTRAVENOUS
  Administered 2022-12-19: 0.9 ug/kg/h via INTRAVENOUS
  Administered 2022-12-20: 0.8 ug/kg/h via INTRAVENOUS
  Administered 2022-12-20: 0.9 ug/kg/h via INTRAVENOUS
  Administered 2022-12-20 – 2022-12-21 (×2): 0.8 ug/kg/h via INTRAVENOUS
  Administered 2022-12-21: 0.6 ug/kg/h via INTRAVENOUS
  Administered 2022-12-21: 1.2 ug/kg/h via INTRAVENOUS
  Filled 2022-12-17 (×14): qty 100

## 2022-12-17 MED ORDER — HALOPERIDOL LACTATE 5 MG/ML IJ SOLN
5.0000 mg | Freq: Four times a day (QID) | INTRAMUSCULAR | Status: DC | PRN
  Administered 2022-12-17 – 2022-12-20 (×4): 5 mg via INTRAVENOUS
  Filled 2022-12-17 (×4): qty 1

## 2022-12-17 NOTE — Progress Notes (Signed)
PROGRESS NOTE   CASPER PAGLIUCA  DGU:[id number]    DOB: [birth year]    DOA: 12/14/2022  PCP: Patient, No Pcp Per   I have briefly reviewed patients previous medical records in [facility].  Chief Complaint  Patient presents with   Alcohol Intoxication    Brief Narrative:  37 year old male with PMH of alcohol use disorder, alcohol induced pancreatitis, bipolar 2 disorder/anxiety/PTSD/schizophrenia, HIV, seizures, presented to the ED on 12/14/2022 with complaints of abdominal pain, nausea and vomiting.  Blood alcohol level 386 and lipase 859 on admission.  Admitted for suspected alcohol induced pancreatitis.  Course complicated by alcohol withdrawal with delirium, worsened 2/7 despite CIWA protocol, consulted PCCM, transferred to stepdown, Precedex drip started and remains on it.  2/8: Evaluated patient, remains sedated on Precedex drip with intermittent agitation requiring as needed Ativan's.  Discussed with CCM team who have kindly taken over care onto their service.  TRH signed off 2/8.  Assessment & Plan:  Principal Problem:   Alcohol-induced pancreatitis Active Problems:   Cigarette smoker   History of seizures   Hypocalcemia   HIV disease (HCC)   Thrombocytopenia (HCC)   Alcohol abuse   Alcohol withdrawal (HCC)   Elevated LFTs   Leukopenia   CAP (community acquired pneumonia)   Alcohol withdrawal delirium (HCC)   Alcohol induced pancreatitis: Appears to be continuing to drink heavily.  BAL 386 and lipase 859 on admission.  No imaging done to evaluate pancreatitis.  Treated with bowel rest/n.p.o., IV fluids and pain management.  Lipase down to 328, transaminitis has improved.  Continue IV fluids and supportive care.  Advance diet as tolerated based on his alertness and ability to take p.o. safely.  RUQ ultrasound with hepatic steatosis.  Alcohol use disorder/alcohol intoxication/alcohol withdrawal and at risk for DTs: Unclear as to exact amount of alcohol consumption at home.   Ongoing withdrawal with high CIWA scores in the last 24 hours up to 21 on 2/7.  Patient confused, intermittently agitated, getting out of bed, asking to be discharged.  PCCM consulted, transferred to stepdown, Precedex drip started and remains on it.  2/8: Evaluated patient, remains sedated on Precedex drip with intermittent agitation requiring as needed Ativan's. Continue thiamine, folate and multivitamins.  Minimize opioids.  Elevated LFTs Acute hepatitis panel negative.  Likely related to alcohol use disorder.  AST and ALT have nearly normalized.  Total bilirubin up slightly to 1.5>1.4.   Thrombocytopenia Secondary to alcohol.  No bleeding reported.  Slowly improving.  Leukopenia Secondary to alcohol toxic effects.  No recurrent, WBC back down to 3.3.   Hypomagnesemia: Magnesium 1.5 on 2/7.  Replaced aggressively IV and corrected.  Hypocalcemia Intact PTH low at 10.  Serum calcium corrected to hypoalbuminemia is normal.   Tobacco abuse Cessation counseling to be done when more awake and coherent.   Left lobe atelectasis Less likely pneumonia.  Chest x-ray findings similar to prior from 12/31 per radiology report.  Moreover procalcitonin is negative.  RVP, influenza a and B, COVID-19 testing negative.  Empirically started antibiotics were discontinued.  Incentive spirometry.  Will need follow-up chest x-ray in 4 weeks.  Pneumonia ruled out.  However he is at risk for aspiration from AMS.   Seizure d/o Remains on IV Keppra 500 Mg every 12 hours.   HIV Continue Biktarvy.  CD4 count 253.  Bipolar disorder Currently AMS due to alcohol withdrawal.  Body mass index is 17.19 kg/m.     DVT prophylaxis: SCDs Start: 12/14/22 1713  Code Status: Full Code:  Family Communication: None at bedside Disposition:  Status is: Inpatient Remains inpatient appropriate because: Significant alcohol withdrawal with risk for further deterioration.  Remains on IV Precedex drip.      Consultants:   PCCM  Procedures:     Antimicrobials:      Subjective:  Patient sedated on Precedex drip.  Barely arousable.  Briefly opens eyes to repeated call or touch and then drifts back to sleep.  Per RN, has received couple doses of IV Ativan in addition to the Precedex drip overnight for intermittent agitation.  Objective:   Vitals:   12/17/22 0700 12/17/22 0730 12/17/22 0800 12/17/22 0835  BP: (!) 127/95  (!) 129/92 (!) 129/92  Pulse: 65  68   Resp:   18   Temp:  (!) 97.3 F (36.3 C)    TempSrc:  Axillary    SpO2: 93%  92%   Weight:      Height:        General exam: Young male, moderately built and nourished lying comfortably supine in bed without distress this morning.   Respiratory system: Clear to auscultation.  No increased work of breathing. Cardiovascular system: S1 & S2 heard, RRR. No JVD, murmurs, rubs, gallops or clicks. No pedal edema.  Telemetry personally reviewed: Sinus rhythm. Gastrointestinal system: Abdomen is nondistended, soft and nontender. No organomegaly or masses felt. Normal bowel sounds heard. Central nervous system: Mental status is as noted above. No focal neurological deficits. Extremities: Symmetric 5 x 5 power.  Some finger tremulousness. Skin: No rashes, lesions or ulcers Psychiatry: Judgement and insight impaired. Mood & affect cannot be assessed at this time.     Data Reviewed:   I have personally reviewed following labs and imaging studies   CBC: Recent Labs  Lab 12/14/22 1300 12/15/22 0348 12/16/22 0400 12/17/22 0305  WBC 3.3* 4.0 4.0 3.3*  NEUTROABS 2.5  --   --   --   HGB 14.1 12.8* 12.8* 12.5*  HCT 41.6 38.3* 39.0 38.3*  MCV 98.3 99.7 100.8* 103.2*  PLT 97* 78* 75* 84*    Basic Metabolic Panel: Recent Labs  Lab 12/14/22 1300 12/14/22 1726 12/15/22 0348 12/16/22 0400 12/17/22 0305  NA 141  --  135 130* 136  K 3.6  --  3.5 3.5 3.5  CL 102  --  99 95* 101  CO2 25  --  22 24 23   GLUCOSE 111*  --  118*  84 86  BUN 7  --  <5* <5* <5*  CREATININE 0.62  --  0.45* 0.60* 0.50*  CALCIUM 8.3*  --  8.1* 8.5* 8.7*  MG 2.0 1.7  --  1.5* 2.1    Liver Function Tests: Recent Labs  Lab 12/14/22 1300 12/15/22 0348 12/16/22 0400 12/17/22 0305  AST 225* 101* 46* 52*  ALT 106* 72* 44 40  ALKPHOS 80 63 58 64  BILITOT 0.8 1.2 1.5* 1.4*  PROT 7.8 6.5 6.2* 6.0*  ALBUMIN 4.0 3.2* 3.0* 3.0*    CBG: No results for input(s): "GLUCAP" in the last 168 hours.  Microbiology Studies:   Recent Results (from the past 240 hour(s))  Resp panel by RT-PCR (RSV, Flu A&B, Covid) Anterior Nasal Swab     Status: None   Collection Time: 12/14/22  4:11 PM   Specimen: Anterior Nasal Swab  Result Value Ref Range Status   SARS Coronavirus 2 by RT PCR NEGATIVE NEGATIVE Final    Comment: (NOTE) SARS-CoV-2 target nucleic acids are  NOT DETECTED.  The SARS-CoV-2 RNA is generally detectable in upper respiratory specimens during the acute phase of infection. The lowest concentration of SARS-CoV-2 viral copies this assay can detect is 138 copies/mL. A negative result does not preclude SARS-Cov-2 infection and should not be used as the sole basis for treatment or other patient management decisions. A negative result may occur with  improper specimen collection/handling, submission of specimen other than nasopharyngeal swab, presence of viral mutation(s) within the areas targeted by this assay, and inadequate number of viral copies(<138 copies/mL). A negative result must be combined with clinical observations, patient history, and epidemiological information. The expected result is Negative.  Fact Sheet for Patients:  EntrepreneurPulse.com.au  Fact Sheet for Healthcare Providers:  IncredibleEmployment.be  This test is no t yet approved or cleared by the [place] FDA and  has been authorized for detection and/or diagnosis of SARS-CoV-2 by FDA under an Emergency Use  Authorization (EUA). This EUA will remain  in effect (meaning this test can be used) for the duration of the COVID-19 declaration under Section 564(b)(1) of the Act, 21 U.S.C.section 360bbb-3(b)(1), unless the authorization is terminated  or revoked sooner.       Influenza A by PCR NEGATIVE NEGATIVE Final   Influenza B by PCR NEGATIVE NEGATIVE Final    Comment: (NOTE) The Xpert Xpress SARS-CoV-2/FLU/RSV plus assay is intended as an aid in the diagnosis of influenza from Nasopharyngeal swab specimens and should not be used as a sole basis for treatment. Nasal washings and aspirates are unacceptable for Xpert Xpress SARS-CoV-2/FLU/RSV testing.  Fact Sheet for Patients: EntrepreneurPulse.com.au  Fact Sheet for Healthcare Providers: IncredibleEmployment.be  This test is not yet approved or cleared by the [place] FDA and has been authorized for detection and/or diagnosis of SARS-CoV-2 by FDA under an Emergency Use Authorization (EUA). This EUA will remain in effect (meaning this test can be used) for the duration of the COVID-19 declaration under Section 564(b)(1) of the Act, 21 U.S.C. section 360bbb-3(b)(1), unless the authorization is terminated or revoked.     Resp Syncytial Virus by PCR NEGATIVE NEGATIVE Final    Comment: (NOTE) Fact Sheet for Patients: EntrepreneurPulse.com.au  Fact Sheet for Healthcare Providers: IncredibleEmployment.be  This test is not yet approved or cleared by the [place] FDA and has been authorized for detection and/or diagnosis of SARS-CoV-2 by FDA under an Emergency Use Authorization (EUA). This EUA will remain in effect (meaning this test can be used) for the duration of the COVID-19 declaration under Section 564(b)(1) of the Act, 21 U.S.C. section 360bbb-3(b)(1), unless the authorization is terminated or revoked.  Performed at [facility],  [place] [address] [place], [place] [address]   MRSA Next Gen by PCR, Nasal     Status: None   Collection Time: 12/16/22  3:13 PM   Specimen: Nasal Mucosa; Nasal Swab  Result Value Ref Range Status   MRSA by PCR Next Gen NOT DETECTED NOT DETECTED Final    Comment: (NOTE) The GeneXpert MRSA Assay (FDA approved for NASAL specimens only), is one component of a comprehensive MRSA colonization surveillance program. It is not intended to diagnose MRSA infection nor to guide or monitor treatment for MRSA infections. Test performance is not FDA approved in patients less than 40 years old. Performed at [facility], [place] [address], [place],  [address]     Radiology Studies:  No results found.  Scheduled Meds:    bictegravir-emtricitabine-tenofovir AF  1 tablet Oral Daily  Chlorhexidine Gluconate Cloth  6 each Topical Daily   folic acid  1 mg Oral Daily   LORazepam  0-4 mg Intravenous Q8H   multivitamin with minerals  1 tablet Oral Daily   mouth rinse  15 mL Mouth Rinse 4 times per day   thiamine  100 mg Oral Daily   Or   thiamine  100 mg Intravenous Daily    Continuous Infusions:    dexmedetomidine (PRECEDEX) IV infusion 0.6 mcg/kg/hr (12/17/22 0800)   famotidine (PEPCID) IV Stopped (12/16/22 2208)   levETIRAcetam Stopped (12/17/22 4010)     LOS: 3 days     Vernell Leep, MD,  FACP, FHM, [facility], [facility], [place]     To contact the attending provider between 7A-7P or the covering provider during after hours 7P-7A, please log into the web site [email or web address] and access using universal [place] password for that web site. If you do not have the password, please call the hospital operator.  12/17/2022, 8:55 AM

## 2022-12-17 NOTE — Progress Notes (Signed)
NAME:  Gary [name], MRN:  [id number], DOB:  [birth year], LOS: 3 ADMISSION DATE:  12/14/2022, CONSULTATION DATE:  2/7 REFERRING MD:  Dr. Vladimir Crofts, CHIEF COMPLAINT:  Alcohol Withdrawal   History of Present Illness:  37 year old male with PMH as below, which is significant for HIV, Bipolar disorder, schizophrenia, pancreatitis, and alcohol abuse. He reports drinking approximately one-half gallon of vodka daily. Presented to [facility] ED 2/5 with complaints of abdominal pain and vomiting. Unclear when last drink prior to arrival was. He was admitted to the hospitalist service for treatment of alcohol withdrawal, acute pancreatitis, and LLL pneumonia. He was found to have brought alcohol into the hospital as and empty small bottle of fireball was found in his room. Symptoms of alcohol withdrawal worsened prompting escalating doses of lorazepam. Despite 86mmg of Ativan the last 7 hours he continues to have outbursts prompting additional treatment. PCCM has been consulted for ICU transfer and potentially precedex infusion. Upon my evaluation the patient is complaining of pain all over including chest pain, which is worse with palpation and deep breathing.   Pertinent  Medical History   has a past medical history of Alcohol abuse, Alcohol-induced pancreatitis (04/16/2022), Anxiety, Bipolar 2 disorder ([place]), HIV (human immunodeficiency virus infection) ([place]), Pancreatitis, PTSD (post-traumatic stress disorder), Schizophrenia ([place]), Seizures ([place]), and Subdural hematoma ([place]).   Significant Hospital Events: Including procedures, antibiotic start and stop dates in addition to other pertinent events   2/5 admit 2/6 found to be drinking alcohol brought from home 2/7 tx to ICU for closer CIWA monitoring  Interim History / Subjective:   Patient well sedated with Precedex, but does have occasional outbursts requiring benzodiazepine dosing. Hallucinating at times.   Objective   Blood pressure 119/85, pulse  67, temperature (!) 97.3 F (36.3 C), temperature source Axillary, resp. rate 19, height 6' (1.829 m), weight 57.5 kg, SpO2 90 %.        Intake/Output Summary (Last 24 hours) at 12/17/2022 1111 Last data filed at 12/17/2022 0800 Gross per 24 hour  Intake 1786.88 ml  Output 2500 ml  Net -713.12 ml    Filed Weights   12/16/22 1424 12/16/22 1543  Weight: 66.9 kg 57.5 kg    Examination: General: Thin adult male in NAD HENT: [place]/AT, PERRL, no JVD Lungs: Clear bilateral breath sounds Cardiovascular: RRR, no MRG Abdomen: Soft, NT, ND Neuro: Sedate, alternates between sedated RASS -2 and agitated RASS 3  Resolved Hospital Problem list     Assessment & Plan:   Alcohol withdrawal: Unclear last drink PTA, but has had alcohol inpatient. Alcohol level 328 on admission.  - Keep in ICU - Precedex infusion for RASS goal 0 to -1 - PRN ativan - PRN haldol - Folate, thiamine - No phenobarb due to biktarvy  Alcohol induced pancreatitis: Lipase 859 > 328 - NPO - LR MIVF  HIV: CD4 253 - Continue Biktarvy  Hypocalcemia: corrects for albumin to normal  Seizure disorder - continue home keppra > IV  Thrombocytopenia: etoh - Trend CBC  Hypomag - replaced, trend  Chest pain: atypical - ECG  Best Practice (right click and "Reselect all SmartList Selections" daily)   Diet/type: NPO DVT prophylaxis: not indicated - thrombocytopenia  GI prophylaxis: PPI Lines: N/A Foley:  N/A Code Status:  full code Last date of multidisciplinary goals of care discussion [ ]     Critical care time: 36 minutes     Georgann Housekeeper, AGACNP-BC [place] for personal  pager PCCM on call pager [phone] until 7pm. Please call Elink 7p-7a. [phone]  12/17/2022 11:11 AM

## 2022-12-17 NOTE — TOC Progression Note (Signed)
Transition of Care [facility]) - Progression Note    Patient Details  Name: Gary [name] MRN: [id number] Date of Birth: 1986/02/11  Transition of Care [facility]) CM/SW Contact  Henrietta Dine, RN Phone Number: 12/17/2022, 3:01 PM  Clinical Narrative:    Attempt to discuss d/c needs; pt asleep and did not arouse to voice; unable to complete assessment; Bubba Hales, RN notified; pass on to oncoming TOC.       Expected Discharge Plan and Services                                               Social Determinants of Health (SDOH) Interventions [place]: Food Insecurity Present (10/23/2022)  Housing: High Risk (10/23/2022)  Transportation Needs: Unmet Transportation Needs (10/23/2022)  Utilities: Not At Risk (10/17/2022)  Alcohol Screen: High Risk (07/25/2021)  Depression (PHQ2-9): Medium Risk (02/12/2021)  Tobacco Use: High Risk (11/25/2022)    Readmission Risk Interventions    10/23/2022   11:31 AM 10/23/2022    9:05 AM 06/01/2022    1:14 PM  Readmission Risk Prevention Plan  Transportation Screening Complete Complete Complete  Medication Review ([place]) Complete Complete Referral to Pharmacy  PCP or Specialist appointment within 3-5 days of discharge  Complete Complete  HRI or [place]  Complete Complete  SW Recovery Care/Counseling Consult  Complete Complete  Palliative Care Screening  Not Applicable Not [place]  Not Applicable Not Applicable

## 2022-12-18 LAB — MAGNESIUM: Magnesium: 1.7 mg/dL (ref 1.7–2.4)

## 2022-12-18 LAB — CBC
HCT: 40 % (ref 39.0–52.0)
Hemoglobin: 13.2 g/dL (ref 13.0–17.0)
MCH: 33.2 pg (ref 26.0–34.0)
MCHC: 33 g/dL (ref 30.0–36.0)
MCV: 100.8 fL — ABNORMAL HIGH (ref 80.0–100.0)
Platelets: 145 10*3/uL — ABNORMAL LOW (ref 150–400)
RBC: 3.97 MIL/uL — ABNORMAL LOW (ref 4.22–5.81)
RDW: 13.2 % (ref 11.5–15.5)
WBC: 4.7 10*3/uL (ref 4.0–10.5)
nRBC: 0 % (ref 0.0–0.2)

## 2022-12-18 LAB — COMPREHENSIVE METABOLIC PANEL
ALT: 41 U/L (ref 0–44)
AST: 51 U/L — ABNORMAL HIGH (ref 15–41)
Albumin: 3.1 g/dL — ABNORMAL LOW (ref 3.5–5.0)
Alkaline Phosphatase: 74 U/L (ref 38–126)
Anion gap: 13 (ref 5–15)
BUN: 6 mg/dL (ref 6–20)
CO2: 20 mmol/L — ABNORMAL LOW (ref 22–32)
Calcium: 8.7 mg/dL — ABNORMAL LOW (ref 8.9–10.3)
Chloride: 102 mmol/L (ref 98–111)
Creatinine, Ser: 0.62 mg/dL (ref 0.61–1.24)
GFR, Estimated: >60 mL/min (ref >60)
Glucose, Bld: 78 mg/dL (ref 70–99)
Potassium: 3.6 mmol/L (ref 3.5–5.1)
Sodium: 135 mmol/L (ref 135–145)
Total Bilirubin: 1.3 mg/dL — ABNORMAL HIGH (ref 0.3–1.2)
Total Protein: 6.7 g/dL (ref 6.5–8.1)

## 2022-12-18 LAB — PHOSPHORUS: Phosphorus: 3.6 mg/dL (ref 2.5–4.6)

## 2022-12-18 MED ORDER — POTASSIUM CHLORIDE CRYS ER 20 MEQ PO TBCR: 40.0000 meq | EXTENDED_RELEASE_TABLET | Freq: Once | ORAL | Status: DC

## 2022-12-18 MED ORDER — MAGNESIUM SULFATE 2 GM/50ML IV SOLN
2.0000 g | Freq: Once | INTRAVENOUS | Status: AC
  Administered 2022-12-18: 2 g via INTRAVENOUS
  Filled 2022-12-18: qty 50

## 2022-12-18 MED ORDER — POTASSIUM CHLORIDE 10 MEQ/100ML IV SOLN
10.0000 meq | INTRAVENOUS | Status: AC
  Administered 2022-12-18 (×2): 10 meq via INTRAVENOUS
  Filled 2022-12-18 (×2): qty 100

## 2022-12-18 MED ORDER — POTASSIUM CHLORIDE 20 MEQ PO PACK: 40.0000 meq | PACK | Freq: Once | ORAL | Status: DC

## 2022-12-18 MED ORDER — DEXTROSE IN LACTATED RINGERS 5 % IV SOLN: INTRAVENOUS | Status: DC

## 2022-12-18 NOTE — Progress Notes (Addendum)
NAME:  Gary [name], MRN:  [id number], DOB:  1986-06-30, LOS: 4 ADMISSION DATE:  12/14/2022, CONSULTATION DATE:  2/7 REFERRING MD:  Dr. Vladimir Crofts, CHIEF COMPLAINT:  Alcohol Withdrawal   History of Present Illness:  37 year old male with PMH as below, which is significant for HIV, Bipolar disorder, schizophrenia, pancreatitis, and alcohol abuse. He reports drinking approximately one-half gallon of vodka daily. Presented to [facility] ED 2/5 with complaints of abdominal pain and vomiting. Unclear when last drink prior to arrival was. He was admitted to the hospitalist service for treatment of alcohol withdrawal, acute pancreatitis, and LLL pneumonia. He was found to have brought alcohol into the hospital as and empty small bottle of fireball was found in his room. Symptoms of alcohol withdrawal worsened prompting escalating doses of lorazepam. Despite 28mg of Ativan the last 7 hours he continues to have outbursts prompting additional treatment. PCCM has been consulted for ICU transfer and potentially precedex infusion. Upon my evaluation the patient is complaining of pain all over including chest pain, which is worse with palpation and deep breathing.   Pertinent  Medical History   has a past medical history of Alcohol abuse, Alcohol-induced pancreatitis (04/16/2022), Anxiety, Bipolar 2 disorder ([place], HIV (human immunodeficiency virus infection) ([place], Pancreatitis, PTSD (post-traumatic stress disorder), Schizophrenia ([place], Seizures ([place], and Subdural hematoma ([place].   Significant Hospital Events: Including procedures, antibiotic start and stop dates in addition to other pertinent events   2/5 admit 2/6 found to be drinking alcohol brought from home 2/7 tx to ICU for Precedex infusion  Interim History / Subjective:   No acute events overnight. Requiring Precedex infusion and frequent dosing of ativan  Objective   Blood pressure 114/81, pulse 68, temperature (!) 97.5 F (36.4 C),  temperature source Axillary, resp. rate 18, height 6' (1.829 m), weight 57.5 kg, SpO2 91 %.        Intake/Output Summary (Last 24 hours) at 12/18/2022 1100 Last data filed at 12/18/2022 1042 Gross per 24 hour  Intake 752.66 ml  Output 1000 ml  Net -247.34 ml    Filed Weights   12/16/22 1424 12/16/22 1543  Weight: 66.9 kg 57.5 kg    Examination: General: Thin adult male, resting comfortably HENT: [place]/AT, PERRL, no JVD Lungs: Clear bilateral breath sounds Cardiovascular: RRR, no MRG Abdomen: Soft, NT, ND Neuro: Sedate, alternates between sedated RASS -2 and agitated RASS 3  Resolved Hospital Problem list     Assessment & Plan:   Alcohol withdrawal with delirium tremens: Unclear last drink PTA, but has had alcohol inpatient. Alcohol level 328 on admission.  - Precedex infusion for RASS goal 0 to -1 - PRN ativan - PRN haldol - Folate, thiamine - No phenobarb (on biktarvy) - LFT, bili trending down.   Alcohol induced pancreatitis: Lipase 859 > 328 - NPO, could probably try some clears at this point if he becomes able to take PO - D5LR MIVF  HIV: CD4 253 - Continue Biktarvy  Hypocalcemia: corrects for albumin to normal Hypomag - give mag  LLL ATX vs aspiration - continue to monitor off ABX  Seizure disorder - continue home keppra > IV  Thrombocytopenia: etoh - Trend CBC  Best Practice (right click and "Reselect all SmartList Selections" daily)   Diet/type: NPO DVT prophylaxis: not indicated - thrombocytopenia  GI prophylaxis: PPI Lines: N/A Foley:  N/A Code Status:  full code Last date of multidisciplinary goals of care discussion [ ]$     Critical care time: 35 minutes  Georgann Housekeeper, AGACNP-BC [facility]  See [place] for personal pager PCCM on call pager [phone] until 7pm. Please call Elink 7p-7a. [phone]  12/18/2022 11:00 AM

## 2022-12-18 NOTE — Progress Notes (Signed)
[facility] ADULT ICU REPLACEMENT PROTOCOL   The patient does apply for the [facility] Adult ICU Electrolyte Replacment Protocol based on the criteria listed below:   1.Exclusion criteria: TCTS, ECMO, Dialysis, and Myasthenia Gravis patients 2. Is GFR >/= 30 ml/min? Yes.    Patient's GFR today is >60 3. Is SCr </= 2? Yes.   Patient's SCr is 0.62 mg/dL 4. Did SCr increase >/= 0.5 in 24 hours? No. 5.Pt's weight >40kg  Yes.   6. Abnormal electrolyte(s): potassium 3.6, mag 1.7  7. Electrolytes replaced per protocol 8.  Call MD STAT for K+ </= 2.5, Phos </= 1, or Mag </= 1 Physician:  protocol  Darlys Gales 12/18/2022 5:09 AM

## 2022-12-18 NOTE — TOC Progression Note (Signed)
Transition of Care [facility]) - Progression Note   Patient Details  Name: Gary [name] MRN: [id number] Date of Birth: 09/23/1986  Transition of Care [facility]) CM/SW [place], LCSW Phone Number: 12/18/2022, 1:16 PM  Clinical Narrative: Patient's SDOH screening identified food security, transportation, and housing as concerns. [name] information added to AVS. Patient will return to the [name] at discharge, so a bus pass has been attached to his chart.    Social Determinants of Health ([place]) Interventions [place]: Food Insecurity Present (10/23/2022)  Housing: High Risk (10/23/2022)  Transportation Needs: Unmet Transportation Needs (10/23/2022)  Utilities: Not At Risk (10/17/2022)  Alcohol Screen: High Risk (07/25/2021)  Depression (PHQ2-9): Medium Risk (02/12/2021)  Tobacco Use: High Risk (11/25/2022)   Readmission Risk Interventions    10/23/2022   11:31 AM 10/23/2022    9:05 AM 06/01/2022    1:14 PM  Readmission Risk Prevention Plan  Transportation Screening Complete Complete Complete  Medication Review ([place]) Complete Complete Referral to Pharmacy  PCP or Specialist appointment within 3-5 days of discharge  Complete Complete  HRI or [place]  Complete Complete  SW Recovery Care/Counseling Consult  Complete Complete  Palliative Care Screening  Not Applicable Not [place]  Not Applicable Not Applicable

## 2022-12-19 ENCOUNTER — Inpatient Hospital Stay (HOSPITAL_COMMUNITY): Payer: Medicaid Other

## 2022-12-19 DIAGNOSIS — R451 Restlessness and agitation: Secondary | ICD-10-CM

## 2022-12-19 LAB — GLUCOSE, CAPILLARY
Glucose-Capillary: 132 mg/dL — ABNORMAL HIGH (ref 70–99)
Glucose-Capillary: 118 mg/dL — ABNORMAL HIGH (ref 70–99)

## 2022-12-19 NOTE — Progress Notes (Signed)
NAME:  Gary [name], MRN:  [id number], DOB:  02/03/1986, LOS: 5 ADMISSION DATE:  12/14/2022, CONSULTATION DATE:  2/7 REFERRING MD:  Dr. Vladimir Crofts, CHIEF COMPLAINT:  Alcohol Withdrawal   History of Present Illness:  37 year old male with PMH as below, which is significant for HIV, Bipolar disorder, schizophrenia, pancreatitis, and alcohol abuse. He reports drinking approximately one-half gallon of vodka daily. Presented to [facility] ED 2/5 with complaints of abdominal pain and vomiting. Unclear when last drink prior to arrival was. He was admitted to the hospitalist service for treatment of alcohol withdrawal, acute pancreatitis, and LLL pneumonia. He was found to have brought alcohol into the hospital as and empty small bottle of fireball was found in his room. Symptoms of alcohol withdrawal worsened prompting escalating doses of lorazepam. Despite 45mg of Ativan the last 7 hours he continues to have outbursts prompting additional treatment. PCCM has been consulted for ICU transfer and potentially precedex infusion. Upon my evaluation the patient is complaining of pain all over including chest pain, which is worse with palpation and deep breathing.   Pertinent  Medical History   has a past medical history of Alcohol abuse, Alcohol-induced pancreatitis (04/16/2022), Anxiety, Bipolar 2 disorder (HCoh[place], HIV (human immunodeficiency virus infection) (H[place], Pancreatitis, PTSD (post-traumatic stress disorder), Schizophrenia (H[place], Seizures (HL[place], and Subdural hematoma (H[place].   Significant Hospital Events: Including procedures, antibiotic start and stop dates in addition to other pertinent events   2/5 admit 2/6 found to be drinking alcohol brought from home 2/7 tx to ICU for Precedex infusion  Interim History / Subjective:   Received ativan 28 mg total in the last 24 hours while trying to wean Precedex Remains on high dose Precedex  Objective   Blood pressure (!) 117/93, pulse 61, temperature 98.1  F (36.7 C), temperature source Axillary, resp. rate (!) 22, height 6' (1.829 m), weight 57.5 kg, SpO2 93 %.        Intake/Output Summary (Last 24 hours) at 12/19/2022 0829 Last data filed at 12/19/2022 0300 Gross per 24 hour  Intake 1706.4 ml  Output 1825 ml  Net -118.6 ml   Filed Weights   12/16/22 1424 12/16/22 1543  Weight: 66.9 kg 57.5 kg   Physical Exam: General: Chronically ill-appearing, no acute distress, sedated HENT: [place], AT Eyes: EOMI, no scleral icterus Respiratory: Diminished to auscultation bilaterally.  No crackles, wheezing or rales Cardiovascular: RRR, -M/R/G, no JVD Extremities:-Edema,-tenderness Neuro: AAO x4, CNII-XII grossly intact Psych: Normal mood, normal affect  CMET reviewed. AST 50s, improved compared to admission Plt improving to 145  Resolved Hospital Problem list     Assessment & Plan:   Alcohol withdrawal with delirium tremens: Unclear last drink PTA, but has had alcohol inpatient. Alcohol level 328 on admission.  - Wean Precedex infusion for RASS goal 0 to -1 - PRN ativan - PRN haldol - Folate, thiamine - No phenobarb (on biktarvy) - LFT, bili trending down.   Alcohol induced pancreatitis: Lipase 859 > 328 - CLD 2/9 - Decrease D5LR MIVF to 50cc/h  HIV: CD4 253 - Continue Biktarvy  Hypocalcemia: corrects for albumin to normal Hypomag - give mag  LLL ATX vs aspiration Episode of hypoxemia overnight 2/10 - continue to monitor off ABX - CXR today   Seizure disorder - continue home keppra > IV  Thrombocytopenia: etoh - improving - Trend CBC  Best Practice (right click and "Reselect all SmartList Selections" daily)   Diet/type: clear liquids DVT prophylaxis: not indicated - thrombocytopenia. Consider  if counts stable tomorrow GI prophylaxis: PPI Lines: N/A Foley:  N/A Code Status:  full code Last date of multidisciplinary goals of care discussion [ ]$     Critical care time: 30 minutes    The patient is critically ill  with alcohol withdrawal requiring IV sedation for agitation and requires high complexity decision making for assessment and support, frequent evaluation and titration of therapies, application of advanced monitoring technologies and extensive interpretation of multiple databases.  Independent Critical Care Time: 30 Minutes.   [name], M.D. [facility] Pulmonary/Critical Care Medicine 12/19/2022 8:34 AM   Please see Amion for pager number to reach on-call Pulmonary and Critical Care Team.

## 2022-12-20 LAB — BASIC METABOLIC PANEL
Anion gap: 10 (ref 5–15)
BUN: <5 mg/dL — ABNORMAL LOW (ref 6–20)
CO2: 24 mmol/L (ref 22–32)
Calcium: 9.1 mg/dL (ref 8.9–10.3)
Chloride: 103 mmol/L (ref 98–111)
Creatinine, Ser: 0.52 mg/dL — ABNORMAL LOW (ref 0.61–1.24)
GFR, Estimated: >60 mL/min (ref >60)
Glucose, Bld: 113 mg/dL — ABNORMAL HIGH (ref 70–99)
Potassium: 3.4 mmol/L — ABNORMAL LOW (ref 3.5–5.1)
Sodium: 137 mmol/L (ref 135–145)

## 2022-12-20 LAB — GLUCOSE, CAPILLARY
Glucose-Capillary: 117 mg/dL — ABNORMAL HIGH (ref 70–99)
Glucose-Capillary: 157 mg/dL — ABNORMAL HIGH (ref 70–99)

## 2022-12-20 LAB — MAGNESIUM: Magnesium: 2 mg/dL (ref 1.7–2.4)

## 2022-12-20 MED ORDER — POTASSIUM CHLORIDE 10 MEQ/100ML IV SOLN
10.0000 meq | INTRAVENOUS | Status: AC
  Administered 2022-12-20 (×4): 10 meq via INTRAVENOUS
  Filled 2022-12-20 (×4): qty 100

## 2022-12-20 MED ORDER — LORAZEPAM 1 MG PO TABS: 1.0000 mg | ORAL_TABLET | ORAL | Status: DC | PRN

## 2022-12-20 MED ORDER — HEPARIN SODIUM (PORCINE) 5000 UNIT/ML IJ SOLN
5000.0000 [IU] | Freq: Three times a day (TID) | INTRAMUSCULAR | Status: DC
  Administered 2022-12-20 (×2): 5000 [IU] via SUBCUTANEOUS
  Filled 2022-12-20 (×4): qty 1

## 2022-12-20 MED ORDER — LORAZEPAM 2 MG/ML IJ SOLN
1.0000 mg | INTRAMUSCULAR | Status: DC | PRN
  Administered 2022-12-20: 3 mg via INTRAVENOUS
  Administered 2022-12-20 (×2): 4 mg via INTRAVENOUS
  Administered 2022-12-20: 3 mg via INTRAVENOUS
  Administered 2022-12-20: 2 mg via INTRAVENOUS
  Administered 2022-12-20 (×3): 4 mg via INTRAVENOUS
  Filled 2022-12-20 (×2): qty 2
  Filled 2022-12-20: qty 1
  Filled 2022-12-20 (×5): qty 2

## 2022-12-20 NOTE — Progress Notes (Signed)
NAME:  Gary [name], MRN:  [id number], DOB:  December 02, 1985, LOS: 6 ADMISSION DATE:  12/14/2022, CONSULTATION DATE:  2/7 REFERRING MD:  Dr. Vladimir Crofts, CHIEF COMPLAINT:  Alcohol Withdrawal   History of Present Illness:  37 year old male with PMH as below, which is significant for HIV, Bipolar disorder, schizophrenia, pancreatitis, and alcohol abuse. He reports drinking approximately one-half gallon of vodka daily. Presented to [facility] ED 2/5 with complaints of abdominal pain and vomiting. Unclear when last drink prior to arrival was. He was admitted to the hospitalist service for treatment of alcohol withdrawal, acute pancreatitis, and LLL pneumonia. He was found to have brought alcohol into the hospital as and empty small bottle of fireball was found in his room. Symptoms of alcohol withdrawal worsened prompting escalating doses of lorazepam. Despite 58mg of Ativan the last 7 hours he continues to have outbursts prompting additional treatment. PCCM has been consulted for ICU transfer and potentially precedex infusion. Upon my evaluation the patient is complaining of pain all over including chest pain, which is worse with palpation and deep breathing.   Pertinent  Medical History   has a past medical history of Alcohol abuse, Alcohol-induced pancreatitis (04/16/2022), Anxiety, Bipolar 2 disorder (H[place], HIV (human immunodeficiency virus infection) (HH[place], Pancreatitis, PTSD (post-traumatic stress disorder), Schizophrenia (HTriana, Seizures (HS.[place].J., and Subdural hematoma (HMifflin.   Significant Hospital Events: Including procedures, antibiotic start and stop dates in addition to other pertinent events   2/5 admit 2/6 found to be drinking alcohol brought from home 2/7 tx to ICU for Precedex infusion 2/11 remains on precedex  Interim History / Subjective:   Received ativan 25 mg total in the last 24 hours while trying to wean Precedex Weaned to 0.9 Precedex  Objective   Blood pressure 110/72, pulse 70,  temperature 98 F (36.7 C), temperature source Axillary, resp. rate 19, height 6' (1.829 m), weight 57.5 kg, SpO2 96 %.        Intake/Output Summary (Last 24 hours) at 12/20/2022 0847 Last data filed at 12/20/2022 0716 Gross per 24 hour  Intake 2469.1 ml  Output 800 ml  Net 1669.1 ml   Filed Weights   12/16/22 1424 12/16/22 1543  Weight: 66.9 kg 57.5 kg   Physical Exam: General: Chronically ill-appearing, drowsy, grimaces to noxious stimuli HENT: [place], AT, OP clear, MMM Eyes: EOMI, no scleral icterus Respiratory: Clear to auscultation bilaterally.  No crackles, wheezing or rales Cardiovascular: RRR, -M/R/G, no JVD GI: BS+, soft, nontender Extremities:-Edema,-tenderness Neuro: grimaces to noxious stimuli  K 3.4   CMET reviewed. AST 50s, improved compared to admission Plt improving to 145  Resolved Hospital Problem list     Assessment & Plan:   Alcohol withdrawal with delirium tremens: Unclear last drink PTA, but has had alcohol inpatient. Alcohol level 328 on admission.  - Wean Precedex infusion for RASS goal 0 to -1 - PRN ativan - PRN haldol - Folate, thiamine - No phenobarb (on biktarvy) - LFT, bili trending down.   Alcohol induced pancreatitis: Lipase 859 > 328 - CLD 2/9 - Decrease D5LR MIVF to 50cc/h - Trend LFTs  HIV: CD4 253 - Continue Biktarvy  Hypocalcemia: corrects for albumin to normal Hypomag - Trend  LLL ATX vs aspiration Chronic left hemidiaphragm elevation Episode of hypoxemia overnight 2/10 - Currently on room air - continue to monitor off ABX  Seizure disorder - continue home keppra > IV  Thrombocytopenia: etoh - improving - Trend CBC  Best Practice (right click and "Reselect all SmartList  Selections" daily)   Diet/type: clear liquids DVT prophylaxis: prophylactic heparin  Monitor Plt GI prophylaxis: PPI Lines: N/A Foley:  N/A Code Status:  full code Last date of multidisciplinary goals of care discussion [ ]$     Critical care  time: 35 minutes     The patient is critically ill with multiple organ systems failure and requires high complexity decision making for assessment and support, frequent evaluation and titration of therapies, application of advanced monitoring technologies and extensive interpretation of multiple databases.  Independent Critical Care Time: 35 Minutes.   [name], M.D. [facility] Pulmonary/Critical Care Medicine 12/20/2022 8:47 AM   Please see Amion for pager number to reach on-call Pulmonary and Critical Care Team.

## 2022-12-20 NOTE — Progress Notes (Signed)
[place] Progress Note Patient Name: Gary [name] DOB: July 21, 1986 MRN: [id number]   Date of Service  12/20/2022  HPI/Events of Note  Patient admitted with alcohol withdrawal intoxicated EtOH 250.  72 hours of expired patient continues to score on CIWA.  eICU Interventions  Resume Ativan for additional 24 hours.     Intervention Category Minor Interventions: Agitation / anxiety - evaluation and management  Hoover Grewe 12/20/2022, 1:06 AM

## 2022-12-20 NOTE — Progress Notes (Signed)
[place] Progress Note Patient Name: Gary [name] DOB: 02-18-1986 MRN: [id number]   Date of Service  12/20/2022  HPI/Events of Note  Still scoring high CIWA. Ativan order is about to expire.  Seen sitting up at the side of the bed  eICU Interventions  Ativan as per CIWA protocol re-ordered Fall precaution     Intervention Category Minor Interventions: Agitation / anxiety - evaluation and management;Routine modifications to care plan (e.g. PRN medications for pain, fever)  Shona Needles Mani Celestin 12/20/2022, 11:34 PM

## 2022-12-20 NOTE — Progress Notes (Signed)
[facility] ADULT ICU REPLACEMENT PROTOCOL   The patient does apply for the [facility] Adult ICU Electrolyte Replacment Protocol based on the criteria listed below:   1.Exclusion criteria: TCTS, ECMO, Dialysis, and Myasthenia Gravis patients 2. Is GFR >/= 30 ml/min? Yes.    Patient's GFR today is >60 3. Is SCr </= 2? Yes.   Patient's SCr is 0.52 mg/dL 4. Did SCr increase >/= 0.5 in 24 hours? No. 5.Pt's weight >40kg  Yes.   6. Abnormal electrolyte(s): K  7. Electrolytes replaced per protocol 8.  Call MD STAT for K+ </= 2.5, Phos </= 1, or Mag </= 1 Physician:  Verdie Mosher Dixie Coppa 12/20/2022 6:07 AM

## 2022-12-21 LAB — COMPREHENSIVE METABOLIC PANEL
ALT: 39 U/L (ref 0–44)
AST: 46 U/L — ABNORMAL HIGH (ref 15–41)
Albumin: 3.2 g/dL — ABNORMAL LOW (ref 3.5–5.0)
Alkaline Phosphatase: 70 U/L (ref 38–126)
Anion gap: 10 (ref 5–15)
BUN: <5 mg/dL — ABNORMAL LOW (ref 6–20)
CO2: 25 mmol/L (ref 22–32)
Calcium: 9.2 mg/dL (ref 8.9–10.3)
Chloride: 103 mmol/L (ref 98–111)
Creatinine, Ser: 0.58 mg/dL — ABNORMAL LOW (ref 0.61–1.24)
GFR, Estimated: >60 mL/min (ref >60)
Glucose, Bld: 98 mg/dL (ref 70–99)
Potassium: 3.3 mmol/L — ABNORMAL LOW (ref 3.5–5.1)
Sodium: 138 mmol/L (ref 135–145)
Total Bilirubin: 0.7 mg/dL (ref 0.3–1.2)
Total Protein: 6.6 g/dL (ref 6.5–8.1)

## 2022-12-21 LAB — GLUCOSE, CAPILLARY
Glucose-Capillary: 113 mg/dL — ABNORMAL HIGH (ref 70–99)
Glucose-Capillary: 98 mg/dL (ref 70–99)

## 2022-12-21 LAB — CBC
HCT: 37.6 % — ABNORMAL LOW (ref 39.0–52.0)
Hemoglobin: 12.4 g/dL — ABNORMAL LOW (ref 13.0–17.0)
MCH: 33.2 pg (ref 26.0–34.0)
MCHC: 33 g/dL (ref 30.0–36.0)
MCV: 100.5 fL — ABNORMAL HIGH (ref 80.0–100.0)
Platelets: 213 10*3/uL (ref 150–400)
RBC: 3.74 MIL/uL — ABNORMAL LOW (ref 4.22–5.81)
RDW: 13.2 % (ref 11.5–15.5)
WBC: 3.7 10*3/uL — ABNORMAL LOW (ref 4.0–10.5)
nRBC: 0 % (ref 0.0–0.2)

## 2022-12-21 MED ORDER — ONDANSETRON 4 MG PO TBDP: 4.0000 mg | ORAL_TABLET | Freq: Four times a day (QID) | ORAL | Status: DC | PRN

## 2022-12-21 MED ORDER — CHLORDIAZEPOXIDE HCL 25 MG PO CAPS: 25.0000 mg | ORAL_CAPSULE | Freq: Four times a day (QID) | ORAL | Status: DC | PRN

## 2022-12-21 MED ORDER — CHLORDIAZEPOXIDE HCL 25 MG PO CAPS
50.0000 mg | ORAL_CAPSULE | Freq: Once | ORAL | Status: AC
  Administered 2022-12-21: 50 mg via ORAL
  Filled 2022-12-21: qty 2

## 2022-12-21 MED ORDER — LOPERAMIDE HCL 2 MG PO CAPS: 2.0000 mg | ORAL_CAPSULE | ORAL | Status: DC | PRN

## 2022-12-21 MED ORDER — CLONIDINE HCL 0.1 MG PO TABS: 0.1000 mg | ORAL_TABLET | ORAL | Status: DC

## 2022-12-21 MED ORDER — ADULT MULTIVITAMIN W/MINERALS CH
1.0000 | ORAL_TABLET | Freq: Every day | ORAL | Status: DC
  Administered 2022-12-22: 1 via ORAL
  Filled 2022-12-21: qty 1

## 2022-12-21 MED ORDER — POTASSIUM CHLORIDE 10 MEQ/100ML IV SOLN
10.0000 meq | INTRAVENOUS | Status: AC
  Administered 2022-12-21 (×6): 10 meq via INTRAVENOUS
  Filled 2022-12-21 (×5): qty 100

## 2022-12-21 MED ORDER — CHLORDIAZEPOXIDE HCL 25 MG PO CAPS
25.0000 mg | ORAL_CAPSULE | Freq: Four times a day (QID) | ORAL | Status: AC
  Administered 2022-12-21 (×4): 25 mg via ORAL
  Filled 2022-12-21 (×4): qty 1

## 2022-12-21 MED ORDER — HYDROXYZINE HCL 25 MG PO TABS
25.0000 mg | ORAL_TABLET | Freq: Four times a day (QID) | ORAL | Status: DC | PRN
  Administered 2022-12-21: 25 mg via ORAL
  Filled 2022-12-21: qty 1

## 2022-12-21 MED ORDER — LORAZEPAM 1 MG PO TABS: 1.0000 mg | ORAL_TABLET | ORAL | Status: DC | PRN

## 2022-12-21 MED ORDER — CLONIDINE HCL 0.1 MG PO TABS: 0.1000 mg | ORAL_TABLET | Freq: Every day | ORAL | Status: DC

## 2022-12-21 MED ORDER — SENNOSIDES-DOCUSATE SODIUM 8.6-50 MG PO TABS
2.0000 | ORAL_TABLET | Freq: Two times a day (BID) | ORAL | Status: DC
  Administered 2022-12-21: 2 via ORAL
  Filled 2022-12-21 (×2): qty 2

## 2022-12-21 MED ORDER — CHLORDIAZEPOXIDE HCL 25 MG PO CAPS
25.0000 mg | ORAL_CAPSULE | Freq: Three times a day (TID) | ORAL | Status: DC
  Administered 2022-12-22: 25 mg via ORAL
  Filled 2022-12-21: qty 1

## 2022-12-21 MED ORDER — DICYCLOMINE HCL 20 MG PO TABS: 20.0000 mg | ORAL_TABLET | Freq: Four times a day (QID) | ORAL | Status: DC | PRN

## 2022-12-21 MED ORDER — CHLORDIAZEPOXIDE HCL 25 MG PO CAPS: 25.0000 mg | ORAL_CAPSULE | ORAL | Status: DC

## 2022-12-21 MED ORDER — THIAMINE HCL 100 MG/ML IJ SOLN: 100.0000 mg | Freq: Once | INTRAMUSCULAR | Status: DC

## 2022-12-21 MED ORDER — HYDROXYZINE HCL 25 MG PO TABS: 25.0000 mg | ORAL_TABLET | Freq: Four times a day (QID) | ORAL | Status: DC | PRN

## 2022-12-21 MED ORDER — LORAZEPAM 1 MG PO TABS: 1.0000 mg | ORAL_TABLET | Freq: Four times a day (QID) | ORAL | Status: DC | PRN

## 2022-12-21 MED ORDER — NAPROXEN 250 MG PO TABS: 500.0000 mg | ORAL_TABLET | Freq: Two times a day (BID) | ORAL | Status: DC | PRN

## 2022-12-21 MED ORDER — LORAZEPAM 2 MG/ML IJ SOLN
1.0000 mg | INTRAMUSCULAR | Status: DC | PRN
  Administered 2022-12-21: 4 mg via INTRAVENOUS
  Administered 2022-12-21 (×2): 2 mg via INTRAVENOUS
  Administered 2022-12-22 (×2): 4 mg via INTRAVENOUS
  Administered 2022-12-22: 2 mg via INTRAVENOUS
  Administered 2022-12-22: 4 mg via INTRAVENOUS
  Filled 2022-12-21 (×3): qty 2
  Filled 2022-12-21: qty 1
  Filled 2022-12-21 (×2): qty 2
  Filled 2022-12-21: qty 1

## 2022-12-21 MED ORDER — THIAMINE MONONITRATE 100 MG PO TABS: 100.0000 mg | ORAL_TABLET | Freq: Every day | ORAL | Status: DC

## 2022-12-21 MED ORDER — CLONIDINE HCL 0.1 MG PO TABS
0.1000 mg | ORAL_TABLET | Freq: Four times a day (QID) | ORAL | Status: DC
  Administered 2022-12-21 – 2022-12-22 (×5): 0.1 mg via ORAL
  Filled 2022-12-21 (×5): qty 1

## 2022-12-21 MED ORDER — METHOCARBAMOL 500 MG PO TABS: 500.0000 mg | ORAL_TABLET | Freq: Three times a day (TID) | ORAL | Status: DC | PRN

## 2022-12-21 MED ORDER — CHLORDIAZEPOXIDE HCL 25 MG PO CAPS: 25.0000 mg | ORAL_CAPSULE | Freq: Every day | ORAL | Status: DC

## 2022-12-21 NOTE — Progress Notes (Signed)
eLink Physician-Brief Progress Note Patient Name: Gary [name] DOB: [birth year] MRN: [id number]   Date of Service  12/21/2022  HPI/Events of Note  CBG 157 On D10 drip at 50 cc/hr  eICU Interventions  Will discontinue D10 drip and continue to monitor closely     Intervention Category Intermediate Interventions: Hyperglycemia - evaluation and treatment  Shona Needles Dulce Martian 12/21/2022, 1:43 AM

## 2022-12-21 NOTE — Progress Notes (Addendum)
NAME:  Gary [name], MRN:  [id number], DOB:  [birth year], LOS: 7 ADMISSION DATE:  12/14/2022, CONSULTATION DATE:  2/7 REFERRING MD:  Dr. Vladimir [name], CHIEF COMPLAINT:  Alcohol Withdrawal   History of Present Illness:  37 year old male with PMH as below, which is significant for HIV, Bipolar disorder, schizophrenia, pancreatitis, and alcohol abuse. He reports drinking approximately one-half gallon of vodka daily. Presented to [facility] ED 2/5 with complaints of abdominal pain and vomiting. Unclear when last drink prior to arrival was. He was admitted to the hospitalist service for treatment of alcohol withdrawal, acute pancreatitis, and LLL pneumonia. He was found to have brought alcohol into the hospital as and empty small bottle of fireball was found in his room. Symptoms of alcohol withdrawal worsened prompting escalating doses of lorazepam. Despite 62mg of Ativan the last 7 hours he continues to have outbursts prompting additional treatment. PCCM has been consulted for ICU transfer and potentially precedex infusion. Upon my evaluation the patient is complaining of pain all over including chest pain, which is worse with palpation and deep breathing.   Pertinent  Medical History   has a past medical history of Alcohol abuse, Alcohol-induced pancreatitis (04/16/2022), Anxiety, Bipolar 2 disorder ([place]Buenaventura [place], HIV (human immunodeficiency virus infection) (HH[place], Pancreatitis, PTSD (post-traumatic stress disorder), Schizophrenia (HWest [place], Seizures (HGroveland Station, and Subdural hematoma (HChe[place].   Significant Hospital Events: Including procedures, antibiotic start and stop dates in addition to other pertinent events   2/5 admit 2/6 found to be drinking alcohol brought from home 2/7 tx to ICU for Precedex infusion 2/11 remains on precedex 2/12 still having trouble weaning dex added librium taper   Interim History / Subjective:  Got 3 doses of ativan overnight  Still on high dose dex   Objective   Blood pressure  107/82, pulse (Abnormal) 54, temperature (Abnormal) 97.5 F (36.4 C), temperature source Axillary, resp. rate 12, height 6' (1.829 m), weight 57.5 kg, SpO2 92 %.        Intake/Output Summary (Last 24 hours) at 12/21/2022 0902 Last data filed at 12/21/2022 0902 Gross per 24 hour  Intake 1969.35 ml  Output 600 ml  Net 1369.35 ml   Filed Weights   12/16/22 1424 12/16/22 1543  Weight: 66.9 kg 57.5 kg   Physical Exam: General 37year old male. Chronically ill and malnourished. Sedated on precedex HENT temporal wasting. MM dry no JVD Pulm  clear Card rrr Abd soft Ext warm and dry  Neuro sedated currently moves all ext not verbal this am  Resolved Hospital Problem list   Thrombocytopenia resolved 2/12  Assessment & Plan:   Alcohol withdrawal with delirium tremens: Unclear last drink PTA, but has had alcohol inpatient. Alcohol level 328 on admission.  Still agitated at times. Not able to wean dex Plan Wean Precedex infusion for RASS goal 0 to -1 I am going to add librium taper as well as clonidine taper to a/w getting off dex  PRN haldol Folate, thiamine No phenobarb (on biktarvy) LFT, bili trending down.   Alcohol induced pancreatitis: Lipase 859 > 328 - CLD 2/9 Plan Cont  D5LR MIVF @ 50cc/h Am lipase  HIV: CD4 253 Plan Continue Biktarvy  Fluid and electrolyte imbalance: hypokalemia Plan Replace and recheck   LLL ATX vs aspiration Chronic left hemidiaphragm elevation Episode of hypoxemia overnight 2/10 - Currently on room air Plan Cont pulse ox continue to monitor off ABX  Seizure disorder Plan continue home keppra > IV until can take PO reliably  Best Practice (right click and "Reselect all SmartList Selections" daily)   Diet/type: clear liquids DVT prophylaxis: prophylactic heparin  Monitor Plt GI prophylaxis: PPI Lines: N/A Foley:  N/A Code Status:  full code Last date of multidisciplinary goals of care discussion [ ]$     Critical care time: 35  minutes     Gary [name] ACNP-BC [place] Pager # [phone] OR # [phone] if no answer   I have taken an interval history, reviewed the chart and examined the patient. I agree with the Advanced Practitioner's note, impression, and recommendations as outlined.   Briefly, 37 year old male admitted for alcohol withdrawal with DTs.  Remains on Precedex  Blood pressure 107/82, pulse (!) 54, temperature (!) 97.5 F (36.4 C), temperature source Axillary, resp. rate 12, height 6' (1.829 m), weight 57.5 kg, SpO2 92 %. On exam, thin middle age male, sedated. Lungs CTAB, no wheezing. Heart RRR no murmurs. Extremities no edema, warm to touch  Available labs and imaging reviewed.  Assessment/Plan Alcohol withdrawa with DTs Alcohol induced pancreatitis Hypokalemia Seizure - on home keppra. No active issues  --Wean Precedex. Start librium and clonidine taper --Folate, thiamine --Advance diet as tolerated  The patient is critically ill with alcohol withdrawal with Dts requring IV sedation and requires high complexity decision making for assessment and support, frequent evaluation and titration of therapies, application of advanced monitoring technologies and extensive interpretation of multiple databases.  Independent Critical Care Time: 38 Minutes.   Gary [name], M.D. [facility] Pulmonary/Critical Care Medicine 12/21/2022 9:19 AM   Please see Amion for pager number to reach on-call Pulmonary and Critical Care Team.

## 2022-12-21 NOTE — Progress Notes (Signed)
[facility] ADULT ICU REPLACEMENT PROTOCOL   The patient does apply for the [facility] Adult ICU Electrolyte Replacment Protocol based on the criteria listed below:   1.Exclusion criteria: TCTS, ECMO, Dialysis, and Myasthenia Gravis patients 2. Is GFR >/= 30 ml/min? Yes.    Patient's GFR today is >60 3. Is SCr </= 2? Yes.   Patient's SCr is 0.58 mg/dL 4. Did SCr increase >/= 0.5 in 24 hours? No. 5.Pt's weight >40kg  Yes.   6. Abnormal electrolyte(s): K  7. Electrolytes replaced per protocol 8.  Call MD STAT for K+ </= 2.5, Phos </= 1, or Mag </= 1 Physician:  Joette Catching [facility] 12/21/2022 5:56 AM

## 2022-12-21 NOTE — Progress Notes (Signed)
[place] Progress Note Patient Name: Gary [name] DOB: 1986-10-13 MRN: [id number]   Date of Service  12/21/2022  HPI/Events of Note  pt consistently refusing sq heparin, has been educated. Still not interested in prophylactic heparin  eICU Interventions  DC heparin order     Intervention Category Minor Interventions: Routine modifications to care plan (e.g. PRN medications for pain, fever)  Emillio Ngo 12/21/2022, 11:32 PM

## 2022-12-22 ENCOUNTER — Emergency Department (HOSPITAL_COMMUNITY): Payer: Medicaid Other

## 2022-12-22 ENCOUNTER — Other Ambulatory Visit: Payer: Self-pay

## 2022-12-22 ENCOUNTER — Inpatient Hospital Stay (HOSPITAL_COMMUNITY)
Admission: EM | Admit: 2022-12-22 | Discharge: 2022-12-27 | DRG: 896 | Disposition: A | Discharge status: Home / Self Care | Payer: Medicaid Other | Attending: Family Medicine | Admitting: Family Medicine

## 2022-12-22 ENCOUNTER — Encounter (HOSPITAL_COMMUNITY): Payer: Self-pay | Admitting: Emergency Medicine

## 2022-12-22 DIAGNOSIS — R45851 Suicidal ideations: Secondary | ICD-10-CM | POA: Diagnosis present

## 2022-12-22 DIAGNOSIS — N400 Enlarged prostate without lower urinary tract symptoms: Secondary | ICD-10-CM | POA: Diagnosis present

## 2022-12-22 DIAGNOSIS — E876 Hypokalemia: Secondary | ICD-10-CM | POA: Diagnosis present

## 2022-12-22 DIAGNOSIS — F10920 Alcohol use, unspecified with intoxication, uncomplicated: Secondary | ICD-10-CM | POA: Diagnosis present

## 2022-12-22 DIAGNOSIS — E860 Dehydration: Secondary | ICD-10-CM | POA: Diagnosis present

## 2022-12-22 DIAGNOSIS — F4310 Post-traumatic stress disorder, unspecified: Secondary | ICD-10-CM | POA: Diagnosis present

## 2022-12-22 DIAGNOSIS — R7989 Other specified abnormal findings of blood chemistry: Secondary | ICD-10-CM | POA: Diagnosis present

## 2022-12-22 DIAGNOSIS — F1010 Alcohol abuse, uncomplicated: Principal | ICD-10-CM

## 2022-12-22 DIAGNOSIS — F10921 Alcohol use, unspecified with intoxication delirium: Secondary | ICD-10-CM | POA: Diagnosis present

## 2022-12-22 DIAGNOSIS — F10939 Alcohol use, unspecified with withdrawal, unspecified: Secondary | ICD-10-CM

## 2022-12-22 DIAGNOSIS — Z91199 Patient's noncompliance with other medical treatment and regimen due to unspecified reason: Secondary | ICD-10-CM

## 2022-12-22 DIAGNOSIS — Z888 Allergy status to other drugs, medicaments and biological substances status: Secondary | ICD-10-CM

## 2022-12-22 DIAGNOSIS — E43 Unspecified severe protein-calorie malnutrition: Secondary | ICD-10-CM | POA: Insufficient documentation

## 2022-12-22 DIAGNOSIS — F2089 Other schizophrenia: Secondary | ICD-10-CM | POA: Diagnosis present

## 2022-12-22 DIAGNOSIS — F10929 Alcohol use, unspecified with intoxication, unspecified: Secondary | ICD-10-CM | POA: Diagnosis present

## 2022-12-22 DIAGNOSIS — G9349 Other encephalopathy: Secondary | ICD-10-CM | POA: Diagnosis present

## 2022-12-22 DIAGNOSIS — F3181 Bipolar II disorder: Secondary | ICD-10-CM | POA: Diagnosis present

## 2022-12-22 DIAGNOSIS — Z5902 Unsheltered homelessness: Secondary | ICD-10-CM

## 2022-12-22 DIAGNOSIS — Z21 Asymptomatic human immunodeficiency virus [HIV] infection status: Secondary | ICD-10-CM | POA: Diagnosis present

## 2022-12-22 DIAGNOSIS — Z9151 Personal history of suicidal behavior: Secondary | ICD-10-CM

## 2022-12-22 DIAGNOSIS — G40909 Epilepsy, unspecified, not intractable, without status epilepticus: Secondary | ICD-10-CM | POA: Diagnosis present

## 2022-12-22 DIAGNOSIS — F10229 Alcohol dependence with intoxication, unspecified: Secondary | ICD-10-CM | POA: Diagnosis present

## 2022-12-22 DIAGNOSIS — K859 Acute pancreatitis without necrosis or infection, unspecified: Secondary | ICD-10-CM | POA: Diagnosis present

## 2022-12-22 DIAGNOSIS — Z8679 Personal history of other diseases of the circulatory system: Secondary | ICD-10-CM

## 2022-12-22 DIAGNOSIS — F17210 Nicotine dependence, cigarettes, uncomplicated: Secondary | ICD-10-CM | POA: Diagnosis present

## 2022-12-22 DIAGNOSIS — Z681 Body mass index (BMI) 19 or less, adult: Secondary | ICD-10-CM

## 2022-12-22 DIAGNOSIS — R64 Cachexia: Secondary | ICD-10-CM | POA: Diagnosis present

## 2022-12-22 DIAGNOSIS — F10239 Alcohol dependence with withdrawal, unspecified: Principal | ICD-10-CM | POA: Diagnosis present

## 2022-12-22 DIAGNOSIS — Z79899 Other long term (current) drug therapy: Secondary | ICD-10-CM

## 2022-12-22 DIAGNOSIS — F1490 Cocaine use, unspecified, uncomplicated: Secondary | ICD-10-CM | POA: Diagnosis present

## 2022-12-22 DIAGNOSIS — Z811 Family history of alcohol abuse and dependence: Secondary | ICD-10-CM

## 2022-12-22 DIAGNOSIS — N170 Acute kidney failure with tubular necrosis: Secondary | ICD-10-CM | POA: Diagnosis present

## 2022-12-22 DIAGNOSIS — F10930 Alcohol use, unspecified with withdrawal, uncomplicated: Secondary | ICD-10-CM | POA: Diagnosis present

## 2022-12-22 DIAGNOSIS — B20 Human immunodeficiency virus [HIV] disease: Secondary | ICD-10-CM | POA: Diagnosis present

## 2022-12-22 LAB — COMPREHENSIVE METABOLIC PANEL
ALT: 36 U/L (ref 0–44)
ALT: 35 U/L (ref 0–44)
AST: 37 U/L (ref 15–41)
AST: 31 U/L (ref 15–41)
Albumin: 3.1 g/dL — ABNORMAL LOW (ref 3.5–5.0)
Albumin: 3.3 g/dL — ABNORMAL LOW (ref 3.5–5.0)
Alkaline Phosphatase: 61 U/L (ref 38–126)
Alkaline Phosphatase: 59 U/L (ref 38–126)
Anion gap: 10 (ref 5–15)
Anion gap: 10 (ref 5–15)
BUN: 6 mg/dL (ref 6–20)
BUN: 10 mg/dL (ref 6–20)
CO2: 23 mmol/L (ref 22–32)
CO2: 22 mmol/L (ref 22–32)
Calcium: 9.1 mg/dL (ref 8.9–10.3)
Calcium: 9.1 mg/dL (ref 8.9–10.3)
Chloride: 104 mmol/L (ref 98–111)
Chloride: 105 mmol/L (ref 98–111)
Creatinine, Ser: 0.76 mg/dL (ref 0.61–1.24)
Creatinine, Ser: 1.93 mg/dL — ABNORMAL HIGH (ref 0.61–1.24)
GFR, Estimated: >60 mL/min (ref >60)
GFR, Estimated: 45 mL/min — ABNORMAL LOW (ref >60)
Glucose, Bld: 101 mg/dL — ABNORMAL HIGH (ref 70–99)
Glucose, Bld: 77 mg/dL (ref 70–99)
Potassium: 3.4 mmol/L — ABNORMAL LOW (ref 3.5–5.1)
Potassium: 4 mmol/L (ref 3.5–5.1)
Sodium: 137 mmol/L (ref 135–145)
Sodium: 137 mmol/L (ref 135–145)
Total Bilirubin: 0.8 mg/dL (ref 0.3–1.2)
Total Bilirubin: 0.5 mg/dL (ref 0.3–1.2)
Total Protein: 6.7 g/dL (ref 6.5–8.1)
Total Protein: 6.6 g/dL (ref 6.5–8.1)

## 2022-12-22 LAB — CBC
HCT: 40.3 % (ref 39.0–52.0)
HCT: 38.2 % — ABNORMAL LOW (ref 39.0–52.0)
Hemoglobin: 13.3 g/dL (ref 13.0–17.0)
Hemoglobin: 12.3 g/dL — ABNORMAL LOW (ref 13.0–17.0)
MCH: 33.6 pg (ref 26.0–34.0)
MCH: 32.9 pg (ref 26.0–34.0)
MCHC: 33 g/dL (ref 30.0–36.0)
MCHC: 32.2 g/dL (ref 30.0–36.0)
MCV: 101.8 fL — ABNORMAL HIGH (ref 80.0–100.0)
MCV: 102.1 fL — ABNORMAL HIGH (ref 80.0–100.0)
Platelets: 246 10*3/uL (ref 150–400)
Platelets: 323 10*3/uL (ref 150–400)
RBC: 3.96 MIL/uL — ABNORMAL LOW (ref 4.22–5.81)
RBC: 3.74 MIL/uL — ABNORMAL LOW (ref 4.22–5.81)
RDW: 13.3 % (ref 11.5–15.5)
RDW: 13.7 % (ref 11.5–15.5)
WBC: 3.7 10*3/uL — ABNORMAL LOW (ref 4.0–10.5)
WBC: 5.4 10*3/uL (ref 4.0–10.5)
nRBC: 0 % (ref 0.0–0.2)
nRBC: 0 % (ref 0.0–0.2)

## 2022-12-22 LAB — ETHANOL: Alcohol, Ethyl (B): 52 mg/dL — ABNORMAL HIGH (ref <10)

## 2022-12-22 LAB — MAGNESIUM: Magnesium: 1.8 mg/dL (ref 1.7–2.4)

## 2022-12-22 LAB — GLUCOSE, CAPILLARY: Glucose-Capillary: 100 mg/dL — ABNORMAL HIGH (ref 70–99)

## 2022-12-22 LAB — CBG MONITORING, ED: Glucose-Capillary: 92 mg/dL (ref 70–99)

## 2022-12-22 LAB — LIPASE, BLOOD: Lipase: 108 U/L — ABNORMAL HIGH (ref 11–51)

## 2022-12-22 MED ORDER — SODIUM CHLORIDE 0.9 % IV BOLUS
1000.0000 mL | Freq: Once | INTRAVENOUS | Status: AC
  Administered 2022-12-22: 1000 mL via INTRAVENOUS

## 2022-12-22 MED ORDER — MAGNESIUM SULFATE 2 GM/50ML IV SOLN
2.0000 g | Freq: Once | INTRAVENOUS | Status: AC
  Administered 2022-12-22: 2 g via INTRAVENOUS
  Filled 2022-12-22: qty 50

## 2022-12-22 MED ORDER — ONDANSETRON HCL 4 MG/2ML IJ SOLN
4.0000 mg | Freq: Once | INTRAMUSCULAR | Status: AC
  Administered 2022-12-23: 4 mg via INTRAVENOUS
  Filled 2022-12-22: qty 2

## 2022-12-22 MED ORDER — THIAMINE MONONITRATE 100 MG PO TABS
100.0000 mg | ORAL_TABLET | Freq: Every day | ORAL | Status: DC
  Administered 2022-12-24 – 2022-12-26 (×3): 100 mg via ORAL
  Filled 2022-12-22 (×3): qty 1

## 2022-12-22 MED ORDER — POTASSIUM CHLORIDE CRYS ER 20 MEQ PO TBCR
40.0000 meq | EXTENDED_RELEASE_TABLET | Freq: Once | ORAL | Status: AC
  Administered 2022-12-22: 40 meq via ORAL
  Filled 2022-12-22: qty 2

## 2022-12-22 MED ORDER — FAMOTIDINE 20 MG PO TABS: 20.0000 mg | ORAL_TABLET | Freq: Two times a day (BID) | ORAL | Status: DC

## 2022-12-22 MED ORDER — LORAZEPAM 1 MG PO TABS
0.0000 mg | ORAL_TABLET | Freq: Four times a day (QID) | ORAL | Status: AC
  Administered 2022-12-22 – 2022-12-24 (×2): 2 mg via ORAL
  Filled 2022-12-22: qty 2
  Filled 2022-12-22: qty 1

## 2022-12-22 MED ORDER — LORAZEPAM 1 MG PO TABS
0.0000 mg | ORAL_TABLET | Freq: Two times a day (BID) | ORAL | Status: DC
  Administered 2022-12-25 – 2022-12-26 (×2): 1 mg via ORAL
  Filled 2022-12-22: qty 1
  Filled 2022-12-22: qty 2

## 2022-12-22 MED ORDER — LORAZEPAM 2 MG/ML IJ SOLN
0.0000 mg | Freq: Two times a day (BID) | INTRAMUSCULAR | Status: DC
  Administered 2022-12-25: 2 mg via INTRAVENOUS
  Filled 2022-12-22 (×2): qty 1

## 2022-12-22 MED ORDER — THIAMINE HCL 100 MG/ML IJ SOLN
100.0000 mg | Freq: Every day | INTRAMUSCULAR | Status: DC
  Administered 2022-12-23: 100 mg via INTRAVENOUS
  Filled 2022-12-22: qty 2

## 2022-12-22 MED ORDER — IOHEXOL 300 MG/ML  SOLN
80.0000 mL | Freq: Once | INTRAMUSCULAR | Status: AC | PRN
  Administered 2022-12-22: 80 mL via INTRAVENOUS

## 2022-12-22 MED ORDER — POTASSIUM CHLORIDE CRYS ER 20 MEQ PO TBCR: 40.0000 meq | EXTENDED_RELEASE_TABLET | Freq: Once | ORAL | Status: DC

## 2022-12-22 MED ORDER — LORAZEPAM 2 MG/ML IJ SOLN
0.0000 mg | Freq: Four times a day (QID) | INTRAMUSCULAR | Status: AC
  Administered 2022-12-23 – 2022-12-24 (×3): 2 mg via INTRAVENOUS
  Administered 2022-12-24: 3 mg via INTRAVENOUS
  Administered 2022-12-24: 2 mg via INTRAVENOUS
  Filled 2022-12-22 (×3): qty 1
  Filled 2022-12-22: qty 2
  Filled 2022-12-22: qty 1

## 2022-12-22 MED ORDER — LEVETIRACETAM 500 MG PO TABS
500.0000 mg | ORAL_TABLET | Freq: Two times a day (BID) | ORAL | Status: DC
  Administered 2022-12-22: 500 mg via ORAL
  Filled 2022-12-22: qty 1

## 2022-12-22 NOTE — Progress Notes (Signed)
NAME:  Gary [name], MRN:  [id number], DOB:  [birth year], LOS: 7 ADMISSION DATE:  12/14/2022, CONSULTATION DATE:  2/7 REFERRING MD:  Dr. Vladimir Crofts, CHIEF COMPLAINT:  Alcohol Withdrawal   History of Present Illness:  37 year old male with PMH as below, which is significant for HIV, Bipolar disorder, schizophrenia, pancreatitis, and alcohol abuse. He reports drinking approximately one-half gallon of vodka daily. Presented to [facility] ED 2/5 with complaints of abdominal pain and vomiting. Unclear when last drink prior to arrival was. He was admitted to the hospitalist service for treatment of alcohol withdrawal, acute pancreatitis, and LLL pneumonia. He was found to have brought alcohol into the hospital as and empty small bottle of fireball was found in his room. Symptoms of alcohol withdrawal worsened prompting escalating doses of lorazepam. Despite 25mg of Ativan the last 7 hours he continues to have outbursts prompting additional treatment. PCCM has been consulted for ICU transfer and potentially precedex infusion. Upon my evaluation the patient is complaining of pain all over including chest pain, which is worse with palpation and deep breathing.   Pertinent  Medical History   has a past medical history of Alcohol abuse, Alcohol-induced pancreatitis (04/16/2022), Anxiety, Bipolar 2 disorder (HF[place], HIV (human immunodeficiency virus infection) (HProctorville, Pancreatitis, PTSD (post-traumatic stress disorder), Schizophrenia (HN[place], Seizures (HT[place], and Subdural hematoma (HMenands.   Significant Hospital Events: Including procedures, antibiotic start and stop dates in addition to other pertinent events   2/5 admit 2/6 found to be drinking alcohol brought from home 2/7 tx to ICU for Precedex infusion 2/11 remains on precedex 2/12 still having trouble weaning dex added librium taper  2/13 trial off Precedex  Interim History / Subjective:  Looks a little better   Objective   Blood pressure 107/82, pulse  (Abnormal) 54, temperature (Abnormal) 97.5 F (36.4 C), temperature source Axillary, resp. rate 12, height 6' (1.829 m), weight 57.5 kg, SpO2 92 %.        Intake/Output Summary (Last 24 hours) at 12/21/2022 0902 Last data filed at 12/21/2022 [id number] per 24 hour  Intake 1969.35 ml  Output 600 ml  Net 1369.35 ml   Filed Weights   12/16/22 1424 12/16/22 1543  Weight: 66.9 kg 57.5 kg   Physical Exam: General this is a chronically ill 37year old male patient chronically malnourished, still sedated but more appropriate today HEENT normocephalic does have temporal wasting no JVD mucous membranes dry Pulmonary: Clear to auscultation Cardiac: Regular rate and rhythm Abdomen: Soft nontender Extremities: Warm dry and scaly GU: Clear yellow Neuro: Awake, confused, speech slurred, moves all extremities.  A little less agitated today.    Resolved Hospital Problem list   Thrombocytopenia resolved 2/12  Assessment & Plan:   Alcohol withdrawal with delirium tremens: Unclear last drink PTA, but has had alcohol inpatient. Alcohol level 328 on admission.  Still agitated at times. Not able to wean dex Plan Discontinue Precedex Continue librium taper as well as clonidine taper  PRN haldol Folate, thiamine No phenobarb (on biktarvy) LFT, bili trending down.   Alcohol induced pancreatitis: Lipase 859 > 328 - CLD 2/9 Plan Cont  D5LR MIVF @ 50cc/h Lipase ordered for f/u  HIV: CD4 253 Plan Continue Biktarvy  Fluid and electrolyte imbalance: hypokalemia Plan Replace and recheck  LLL ATX vs aspiration Chronic left hemidiaphragm elevation Episode of hypoxemia overnight 2/10 - Currently on room air Plan Cont pulse ox continue to monitor off ABX  Seizure disorder Plan Will change to oral as he is tolerating  his other medications this way   Best Practice (right click and "Reselect all SmartList Selections" daily)   Diet/type: Regular consistency (see orders) DVT prophylaxis:  prophylactic heparin  Monitor Plt GI prophylaxis: PPI Lines: N/A Foley:  N/A Code Status:  full code Last date of multidisciplinary goals of care discussion [ ]$     Critical care time: 32 min      Erick Colace ACNP-BC [place] Pager # [phone] OR # [phone] if no answer

## 2022-12-22 NOTE — Progress Notes (Signed)
[facility] ADULT ICU REPLACEMENT PROTOCOL   The patient does apply for the [facility] Adult ICU Electrolyte Replacment Protocol based on the criteria listed below:   1.Exclusion criteria: TCTS, ECMO, Dialysis, and Myasthenia Gravis patients 2. Is GFR >/= 30 ml/min? Yes.    Patient's GFR today is >60 3. Is SCr </= 2? Yes.   Patient's SCr is 0.76 mg/dL 4. Did SCr increase >/= 0.5 in 24 hours? No. 5.Pt's weight >40kg  Yes.   6. Abnormal electrolyte(s): mag 1.8, potassium 3.4  7. Electrolytes replaced per protocol 8.  Call MD STAT for K+ </= 2.5, Phos </= 1, or Mag </= 1 Physician:  protocol  Darlys Gales 12/22/2022 4:56 AM

## 2022-12-22 NOTE — Progress Notes (Signed)
Patient leaving against medical advise, MD aware.   Peripheral IV's removed.  Patient's bedside belongings returned to patient.

## 2022-12-22 NOTE — ED Notes (Signed)
Pt b\p has been low since pt got here Rn aware as well as PA

## 2022-12-22 NOTE — ED Provider Notes (Signed)
Received at shift change from kidney PA-C please see note for full detail  In short patient with medical history including HIV, bipolar, schizophrenia, pancreatitis, alcohol dependency, presents with weakness.  Patient states that he left the hospital this morning as he felt like he could manage on his own.  He states that he drank only half a can of a 24 ounce and states he just felt generalized weakness and felt like he needed to come back in.  He states has a slight headache, denies any change in vision paresthesias or weakness of lower extremities, he is endorsing continued chest pain abdominal pain but states this is the same pain that he is having symptoms leaving the hospital.  He states that he typically drinks about 1 L of liquor a day.  I reviewed patient's chart he was admitted for right lower lobe pneumonia and withdrawals, started on ceftriaxone as well as azithromycin, patient withdraws required ICU admission for Precedex, patient was weaned off Precedex and was managing on Librium, at that point he decided to leave AMA.   Physical Exam  BP 97/67   Pulse (!) 106   Resp 18   SpO2 93%   Physical Exam Vitals and nursing note reviewed.  Constitutional:      General: He is not in acute distress.    Appearance: He is ill-appearing and toxic-appearing.     Comments: Chronically ill-appearing disheveled  HENT:     Head: Normocephalic and atraumatic.     Nose: No congestion.     Mouth/Throat:     Mouth: Mucous membranes are dry.     Pharynx: Oropharynx is clear. No oropharyngeal exudate or posterior oropharyngeal erythema.  Eyes:     Extraocular Movements: Extraocular movements intact.     Conjunctiva/sclera: Conjunctivae normal.     Pupils: Pupils are equal, round, and reactive to light.  Cardiovascular:     Rate and Rhythm: Normal rate and regular rhythm.     Pulses: Normal pulses.     Heart sounds: No murmur heard.    No friction rub. No gallop.  Pulmonary:     Effort: No  respiratory distress.     Breath sounds: No wheezing, rhonchi or rales.  Abdominal:     Palpations: Abdomen is soft.     Tenderness: There is abdominal tenderness. There is no right CVA tenderness or left CVA tenderness.     Comments: Abdomen nondistended, soft, he has noted tenderness mainly in his epigastric region without guarding rebound tenderness or peritoneal sign..  Musculoskeletal:     Right lower leg: No edema.     Left lower leg: No edema.  Skin:    General: Skin is warm and dry.  Neurological:     Mental Status: He is alert.     GCS: GCS eye subscore is 4. GCS verbal subscore is 5. GCS motor subscore is 6.     Cranial Nerves: Cranial nerves 2-12 are intact.     Sensory: Sensation is intact.     Motor: No weakness.     Coordination: Romberg sign negative. Finger-Nose-Finger Test normal.     Comments: Cranial nerves II through XII grossly intact, no unilateral weakness, able to follow two-step commands.  Psychiatric:        Mood and Affect: Mood normal.     Comments: Patient seems slightly out of it, takes a few seconds to respond, he does not appear to be intoxicated, he does not endorse any suicidal homicidal ideations does not appear  to be respond to internal stimuli.  He is alert and orient x 4     Procedures  Procedures  ED Course / MDM    Medical Decision Making Amount and/or Complexity of Data Reviewed Labs: ordered. Radiology: ordered.  Risk OTC drugs. Prescription drug management.      Lab Tests:  I Ordered, and personally interpreted labs.  The pertinent results include: CBC shows hemoglobin of 12.3, CMP shows creatinine 1.93, ethanol is 52,   Imaging Studies ordered:  I ordered imaging studies including chest x-ray, CT head, CT abdomen pelvis I independently visualized and interpreted imaging which showed *** I agree with the radiologist interpretation   Cardiac Monitoring:  The patient was maintained on a cardiac monitor.  I personally  viewed and interpreted the cardiac monitored which showed an underlying rhythm of: Sinus without signs of ischemia   Medicines ordered and prescription drug management:  I ordered medication including fluids, antiemetics I have reviewed the patients home medicines and have made adjustments as needed  Critical Interventions:  ***   Reevaluation:  My evaluation patient appears to be slightly dazed, will take a few seconds to respond, there is no focal deficit on my exam, remains borderline hypotensive despite 2 L of fluids, he had some slight abdominal pain and endorsing a headache, will further exam with CT head, chest x-ray, CT ab pelvis.  Will provide with antiemetics, Zofran start him on CIWA provide him with thiamine for concerns of Warnicke's.  Consultations Obtained:  I requested consultation with the ***,  and discussed lab and imaging findings as well as pertinent plan - they recommend: ***    Test Considered:  ***    Rule out ****    Dispostion and problem list  After consideration of the diagnostic results and the patients response to treatment, I feel that the patent would benefit from ***.

## 2022-12-22 NOTE — ED Triage Notes (Signed)
Pt was discharged from hospital earlier today after being admitted for almost 10 days with alcohol induced pancreatitis. Pt admits that when he left the hospital he drank approximately 3/4 of a margarita bottle. Pt has had his Keppra today. No signs of withdrawls at this time.

## 2022-12-22 NOTE — ED Provider Notes (Signed)
[place] EMERGENCY DEPARTMENT AT [facility] Provider Note   CSN: [id number] Arrival date & time: 12/22/22  1941     History  Chief Complaint  Patient presents with   Alcohol Problem    Gary [name] is a 37 y.o. male with a past medical history of HIV, bipolar disorder, schizophrenia, pancreatitis, alcohol abuse presenting today for evaluation of alcoholic intoxication.  Patient just left AMA this morning after being hospitalized since 2/5 for alcohol-induced pancreatitis.  States he told staff this morning that he has felt much better and needs to leave.  States after being discharged he drank more than half of a 24 oz bottle of margarita.  Patient complains of feeling fatigue.  He denies any fever, chest pain, shortness of breath, bowel changes, urinary symptoms, nausea or vomiting.   Alcohol Problem    Past Medical History:  Diagnosis Date   Alcohol abuse    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   Anxiety    Bipolar 2 disorder (HCC)    HIV (human immunodeficiency virus infection) ([place])    Pancreatitis    PTSD (post-traumatic stress disorder)    Schizophrenia ([place])    Seizures ([place])    Subdural hematoma (HCC)    Past Surgical History:  Procedure Laterality Date   BIOPSY  04/19/2022   Procedure: BIOPSY;  Surgeon: Irving Copas., MD;  Location: [facility] ENDOSCOPY;  Service: Gastroenterology;;   ENTEROSCOPY N/A 04/19/2022   Procedure: ENTEROSCOPY;  Surgeon: Irving Copas., MD;  Location: [place];  Service: Gastroenterology;  Laterality: N/A;   INCISION AND DRAINAGE PERIRECTAL ABSCESS N/A 09/24/2016   Procedure: IRRIGATION AND DEBRIDEMENT PERIRECTAL ABSCESS;  Surgeon: Clayburn Pert, MD;  Location: [facility] ORS;  Service: General;  Laterality: N/A;   none       Home Medications Prior to Admission medications   Medication Sig Start Date End Date Taking? Authorizing Provider  bictegravir-emtricitabine-tenofovir AF (BIKTARVY) 50-200-25 MG  TABS tablet Take 1 tablet by mouth daily. 10/23/22   Shelly Coss, MD  folic acid (FOLVITE) 1 MG tablet Take 1 tablet (1 mg total) by mouth daily. Patient not taking: Reported on 12/14/2022 10/23/22   Shelly Coss, MD  levETIRAcetam (KEPPRA) 500 MG tablet Take 1 tablet (500 mg total) by mouth 2 (two) times daily. 10/23/22   Shelly Coss, MD  thiamine (VITAMIN B1) 100 MG tablet Take 1 tablet (100 mg total) by mouth daily. Patient not taking: Reported on 12/14/2022 10/23/22   Shelly Coss, MD  famotidine (PEPCID) 20 MG tablet Take 1 tablet (20 mg total) by mouth 2 (two) times daily. Patient not taking: Reported on 06/01/2019 05/14/19 06/02/19  Davonna Belling, MD      Allergies    Tegretol [carbamazepine] and Caffeine    Review of Systems   Review of Systems Negative except as per HPI.  Physical Exam Updated Vital Signs BP (!) 99/46   Pulse 83   Resp 18   SpO2 99%  Physical Exam Vitals and nursing note reviewed.  Constitutional:      Appearance: He is ill-appearing.  HENT:     Head: Normocephalic and atraumatic.     Mouth/Throat:     Mouth: Mucous membranes are moist.  Eyes:     General: No scleral icterus. Cardiovascular:     Rate and Rhythm: Normal rate and regular rhythm.     Pulses: Normal pulses.     Heart sounds: Normal heart sounds.  Pulmonary:     Effort: Pulmonary effort is normal.  Breath sounds: Normal breath sounds.  Abdominal:     General: Abdomen is flat.     Palpations: Abdomen is soft.     Tenderness: There is no abdominal tenderness.  Musculoskeletal:        General: No deformity.  Skin:    General: Skin is warm.     Findings: No rash.  Neurological:     General: No focal deficit present.     Mental Status: He is alert.  Psychiatric:        Mood and Affect: Mood normal.     ED Results / Procedures / Treatments   Labs (all labs ordered are listed, but only abnormal results are displayed) Labs Reviewed  CBC - Abnormal; Notable for the  following components:      Result Value   RBC 3.74 (*)    Hemoglobin 12.3 (*)    HCT 38.2 (*)    MCV 102.1 (*)    All other components within normal limits  COMPREHENSIVE METABOLIC PANEL  ETHANOL  CBG MONITORING, ED    EKG None  Radiology No results found.  Procedures Procedures    Medications Ordered in ED Medications  sodium chloride 0.9 % bolus 1,000 mL (0 mLs Intravenous Stopped 12/22/22 2201)  sodium chloride 0.9 % bolus 1,000 mL (1,000 mLs Intravenous New Bag/Given 12/22/22 2201)    ED Course/ Medical Decision Making/ A&P                             Medical Decision Making Amount and/or Complexity of Data Reviewed Labs: ordered. Radiology: ordered.  Risk OTC drugs. Prescription drug management. Decision regarding hospitalization.   This patient presents to the ED for alcohol intoxication, this involves an extensive number of treatment options, and is a complaint that carries with a high risk of complications and morbidity.  The differential diagnosis includes alcohol intoxication, alcohol withdrawal, electrolyte abnormalities, seizure, acute pancreatitis.  This is not an exhaustive list.  Lab tests: I ordered and personally interpreted labs.  The pertinent results include: WBC unremarkable. Hbg unremarkable. Platelets unremarkable. Electrolytes unremarkable. BUN, creatinine unremarkable.  Problem list/ ED course/ Critical interventions/ Medical management: HPI: See above Vital signs significant for hypotension. Laboratory/imaging studies significant for: See above. On physical examination, patient is afebrile and appears in no acute distress.  Based on patient's clinical presentations and laboratory/imaging studies I suspect alcohol intoxication. Patient is hypotensive in the [id number] systolically upon arrival. First bolus fluid given. Reevaluation of the patient after these medications showed that the patient improved however BP was still in th 0000000 systolically. I  have ordered a second bolus of normal saline. CMP pending. I have reviewed the patient home medicines and have made adjustments as needed.  Cardiac monitoring/EKG: The patient was maintained on a cardiac monitor.  I personally reviewed and interpreted the cardiac monitor which showed an underlying rhythm of: sinus rhythm.  Additional history obtained: External records from outside source obtained and reviewed including: Chart review including previous notes, labs, imaging.  Disposition Patient's care signed out at shift change to Deno Etienne, PA-C with pending CMP and fluid therapy. This chart was dictated using voice recognition software.  Despite best efforts to proofread,  errors can occur which can change the documentation meaning.          Final Clinical Impression(s) / ED Diagnoses Final diagnoses:  Alcohol abuse  Alcoholic intoxication with complication ([place])    Rx / DC Orders ED  Discharge Orders     None         Rex Kras, [place] 12/23/22 1159    Godfrey Pick, MD 12/26/22 1116

## 2022-12-22 NOTE — Discharge Summary (Signed)
Physician Discharge Summary   AGAINST MEDICAL ADVICE       Patient ID: Gary [name] MRN: [id number] DOB/AGE: 07/10/1986 37 y.o.  Admit date: 12/14/2022 Discharge date: 12/22/2022  Discharge Diagnoses:    Active Hospital Problems   Diagnosis Date Noted   Alcohol-induced pancreatitis 12/14/2022   Seizure disorder ([place]) 12/17/2022   History of seizures 04/16/2022   Alcohol abuse    Thrombocytopenia ([place]) 07/25/2019   Hypocalcemia 07/02/2019   Cigarette smoker 06/16/2018   HIV disease ([place]) 06/16/2018    Resolved Hospital Problems   Diagnosis Date Noted Date Resolved   Alcohol withdrawal with delirium [facility]) 12/16/2022 12/22/2022   Leukopenia 12/14/2022 12/22/2022   CAP (community acquired pneumonia) 12/14/2022 12/22/2022   Elevated LFTs 10/16/2022 12/22/2022   Alcohol withdrawal ([place]) 05/29/2022 12/22/2022      Discharge summary     37 year old male with PMH as below, which is significant for HIV, Bipolar disorder, schizophrenia, pancreatitis, and alcohol abuse. He reports drinking approximately one-half gallon of vodka daily. Presented to [facility] ED 2/5 with complaints of abdominal pain and vomiting. Unclear when last drink prior to arrival was. He was admitted to the hospitalist service for treatment of alcohol withdrawal, acute pancreatitis, and LLL pneumonia. He was found to have brought alcohol into the hospital as and empty small bottle of fireball was found in his room. Symptoms of alcohol withdrawal worsened prompting escalating doses of lorazepam. Despite 31mg of Ativan the last 7 hours he continues to have outbursts prompting additional treatment. PCCM has been consulted for ICU transfer and potentially precedex infusion. Upon my evaluation the patient is complaining of pain all over including chest pain, which is worse with palpation and deep breathing.  2/6 found to be drinking alcohol brought from home 2/7 tx to ICU for Precedex infusion 2/11 remained on  precedex 2/12 started on librium and clonidine.  2/13 awake, alert, oriented x 4. Off precedex around 0800. 1500 in afternoon, remains oriented x 4. Cooperative and not combative. Discussed at length risk of withdrawal seizure, worsening withdrawal. He understands these risks and says he has a right to leave the hospital and that he can just "drink a little bit" and it will help him more than the pills help.It was explained to him the risk associated with this and the almost certainty he will come back. He accepted that risk. Again he was instructed that we cannot advise leaving the hospital and should he insist he would be leaving against medical advice.   Discharge Plan by Active Problems    Leaving AMA    Discharge Exam: Blood Pressure 99/64   Pulse 70   Temperature (Abnormal) 97 F (36.1 C) (Oral)   Respiration 13   Height 6' (1.829 m)   Weight 57.5 kg   Oxygen Saturation 95%   Body Mass Index 17.19 kg/m   General awake. Oriented. Cooperative  HENT temporal wasting Pulm clear Card rrr Abd soft Ext warm  Neuro intact. Awake, oriented. Moves all ext. Able to voice risk associated with leaving and states he has the right to drink if he likes. Also able to voice risk associated w/ leaving and acknowledges that he would be leaving [place]   Labs at discharge   Lab Results  Component Value Date   CREATININE 0.76 12/22/2022   BUN 6 12/22/2022   NA 137 12/22/2022   K 3.4 (L) 12/22/2022   CL 104 12/22/2022   CO2 23 12/22/2022   Lab Results  Component Value Date   WBC 3.7 (L) 12/22/2022   HGB 13.3 12/22/2022   HCT 40.3 12/22/2022   MCV 101.8 (H) 12/22/2022   PLT 246 12/22/2022   Lab Results  Component Value Date   ALT 36 12/22/2022   AST 37 12/22/2022   ALKPHOS 61 12/22/2022   BILITOT 0.8 12/22/2022   Lab Results  Component Value Date   INR 1.1 09/12/2022   INR 1.0 09/11/2022   INR 1.0 12/31/2021    Current radiological studies    No results  found.  Disposition:    Discharge disposition: 01-Home or Self Care       Discharge Instructions     Diet - low sodium heart healthy   Complete by: As directed    Increase activity slowly   Complete by: As directed        Allergies as of 12/22/2022     Allergen Reactions Comments   Tegretol [carbamazepine] Other (See Comments) Caused vertigo for 2 days after taking it   Caffeine Palpitations         Medication List     Stop taking these medications    chlordiazePOXIDE 25 MG capsule Commonly known as: LIBRIUM       Take these medications    Biktarvy 50-200-25 MG Tabs tablet Generic drug: bictegravir-emtricitabine-tenofovir AF Take 1 tablet by mouth daily.   folic acid 1 MG tablet Commonly known as: FOLVITE Take 1 tablet (1 mg total) by mouth daily.   levETIRAcetam 500 MG tablet Commonly known as: KEPPRA Take 1 tablet (500 mg total) by mouth 2 (two) times daily.   thiamine 100 MG tablet Commonly known as: VITAMIN B1 Take 1 tablet (100 mg total) by mouth daily.         Follow-up appointment   NA Discharge Condition:    fair  Physician Statement:   The Patient was personally examined, the discharge assessment and plan has been personally reviewed and I agree with ACNP Maurine Mowbray's assessment and plan. 23  minutes of time have been dedicated to discharge assessment, planning and discharge instructions.   Signed: Clementeen Graham 12/22/2022, 3:22 PM

## 2022-12-23 DIAGNOSIS — F10239 Alcohol dependence with withdrawal, unspecified: Secondary | ICD-10-CM | POA: Diagnosis present

## 2022-12-23 DIAGNOSIS — F10229 Alcohol dependence with intoxication, unspecified: Secondary | ICD-10-CM | POA: Diagnosis present

## 2022-12-23 DIAGNOSIS — Z91199 Patient's noncompliance with other medical treatment and regimen due to unspecified reason: Secondary | ICD-10-CM | POA: Diagnosis not present

## 2022-12-23 DIAGNOSIS — F17210 Nicotine dependence, cigarettes, uncomplicated: Secondary | ICD-10-CM | POA: Diagnosis present

## 2022-12-23 DIAGNOSIS — R45851 Suicidal ideations: Secondary | ICD-10-CM | POA: Diagnosis present

## 2022-12-23 DIAGNOSIS — F10939 Alcohol use, unspecified with withdrawal, unspecified: Secondary | ICD-10-CM | POA: Diagnosis present

## 2022-12-23 DIAGNOSIS — E876 Hypokalemia: Secondary | ICD-10-CM | POA: Diagnosis present

## 2022-12-23 DIAGNOSIS — K859 Acute pancreatitis without necrosis or infection, unspecified: Secondary | ICD-10-CM | POA: Diagnosis present

## 2022-12-23 DIAGNOSIS — F1490 Cocaine use, unspecified, uncomplicated: Secondary | ICD-10-CM | POA: Diagnosis present

## 2022-12-23 DIAGNOSIS — F4310 Post-traumatic stress disorder, unspecified: Secondary | ICD-10-CM | POA: Diagnosis present

## 2022-12-23 DIAGNOSIS — F2089 Other schizophrenia: Secondary | ICD-10-CM | POA: Diagnosis present

## 2022-12-23 DIAGNOSIS — F10930 Alcohol use, unspecified with withdrawal, uncomplicated: Secondary | ICD-10-CM | POA: Diagnosis present

## 2022-12-23 DIAGNOSIS — N400 Enlarged prostate without lower urinary tract symptoms: Secondary | ICD-10-CM | POA: Diagnosis present

## 2022-12-23 DIAGNOSIS — Z5902 Unsheltered homelessness: Secondary | ICD-10-CM | POA: Diagnosis not present

## 2022-12-23 DIAGNOSIS — E860 Dehydration: Secondary | ICD-10-CM | POA: Diagnosis present

## 2022-12-23 DIAGNOSIS — Z681 Body mass index (BMI) 19 or less, adult: Secondary | ICD-10-CM | POA: Diagnosis not present

## 2022-12-23 DIAGNOSIS — B20 Human immunodeficiency virus [HIV] disease: Secondary | ICD-10-CM | POA: Diagnosis present

## 2022-12-23 DIAGNOSIS — F10929 Alcohol use, unspecified with intoxication, unspecified: Secondary | ICD-10-CM | POA: Diagnosis not present

## 2022-12-23 DIAGNOSIS — G40909 Epilepsy, unspecified, not intractable, without status epilepticus: Secondary | ICD-10-CM | POA: Diagnosis present

## 2022-12-23 DIAGNOSIS — F1010 Alcohol abuse, uncomplicated: Secondary | ICD-10-CM | POA: Diagnosis not present

## 2022-12-23 DIAGNOSIS — G9349 Other encephalopathy: Secondary | ICD-10-CM | POA: Diagnosis present

## 2022-12-23 DIAGNOSIS — R64 Cachexia: Secondary | ICD-10-CM | POA: Diagnosis present

## 2022-12-23 DIAGNOSIS — Z79899 Other long term (current) drug therapy: Secondary | ICD-10-CM | POA: Diagnosis not present

## 2022-12-23 DIAGNOSIS — N170 Acute kidney failure with tubular necrosis: Secondary | ICD-10-CM | POA: Diagnosis present

## 2022-12-23 DIAGNOSIS — F3181 Bipolar II disorder: Secondary | ICD-10-CM | POA: Diagnosis present

## 2022-12-23 DIAGNOSIS — E43 Unspecified severe protein-calorie malnutrition: Secondary | ICD-10-CM | POA: Diagnosis present

## 2022-12-23 DIAGNOSIS — R7989 Other specified abnormal findings of blood chemistry: Secondary | ICD-10-CM | POA: Diagnosis present

## 2022-12-23 LAB — PHOSPHORUS: Phosphorus: 3.8 mg/dL (ref 2.5–4.6)

## 2022-12-23 LAB — RAPID URINE DRUG SCREEN, HOSP PERFORMED
Amphetamines: NOT DETECTED
Barbiturates: NOT DETECTED
Benzodiazepines: POSITIVE — AB
Cocaine: NOT DETECTED
Opiates: NOT DETECTED
Tetrahydrocannabinol: NOT DETECTED

## 2022-12-23 LAB — MAGNESIUM: Magnesium: 1.8 mg/dL (ref 1.7–2.4)

## 2022-12-23 LAB — TROPONIN I (HIGH SENSITIVITY)
Troponin I (High Sensitivity): 74 ng/L — ABNORMAL HIGH (ref <18)
Troponin I (High Sensitivity): 61 ng/L — ABNORMAL HIGH (ref <18)

## 2022-12-23 LAB — AMMONIA: Ammonia: 26 umol/L (ref 9–35)

## 2022-12-23 LAB — COMPREHENSIVE METABOLIC PANEL
ALT: 27 U/L (ref 0–44)
AST: 25 U/L (ref 15–41)
Albumin: 2.9 g/dL — ABNORMAL LOW (ref 3.5–5.0)
Alkaline Phosphatase: 62 U/L (ref 38–126)
Anion gap: 5 (ref 5–15)
BUN: <5 mg/dL — ABNORMAL LOW (ref 6–20)
CO2: 21 mmol/L — ABNORMAL LOW (ref 22–32)
Calcium: 8 mg/dL — ABNORMAL LOW (ref 8.9–10.3)
Chloride: 110 mmol/L (ref 98–111)
Creatinine, Ser: 0.62 mg/dL (ref 0.61–1.24)
GFR, Estimated: >60 mL/min (ref >60)
Glucose, Bld: 117 mg/dL — ABNORMAL HIGH (ref 70–99)
Potassium: 3.7 mmol/L (ref 3.5–5.1)
Sodium: 136 mmol/L (ref 135–145)
Total Bilirubin: 0.6 mg/dL (ref 0.3–1.2)
Total Protein: 6 g/dL — ABNORMAL LOW (ref 6.5–8.1)

## 2022-12-23 LAB — URINALYSIS, ROUTINE W REFLEX MICROSCOPIC
Bilirubin Urine: NEGATIVE
Glucose, UA: NEGATIVE mg/dL
Hgb urine dipstick: NEGATIVE
Ketones, ur: NEGATIVE mg/dL
Leukocytes,Ua: NEGATIVE
Nitrite: NEGATIVE
Protein, ur: NEGATIVE mg/dL
Specific Gravity, Urine: 1.014 (ref 1.005–1.030)
pH: 5 (ref 5.0–8.0)

## 2022-12-23 LAB — LACTIC ACID, PLASMA
Lactic Acid, Venous: 1.3 mmol/L (ref 0.5–1.9)
Lactic Acid, Venous: 0.8 mmol/L (ref 0.5–1.9)

## 2022-12-23 LAB — CBC
HCT: 34.9 % — ABNORMAL LOW (ref 39.0–52.0)
Hemoglobin: 11.5 g/dL — ABNORMAL LOW (ref 13.0–17.0)
MCH: 33.6 pg (ref 26.0–34.0)
MCHC: 33 g/dL (ref 30.0–36.0)
MCV: 102 fL — ABNORMAL HIGH (ref 80.0–100.0)
Platelets: 272 10*3/uL (ref 150–400)
RBC: 3.42 MIL/uL — ABNORMAL LOW (ref 4.22–5.81)
RDW: 14 % (ref 11.5–15.5)
WBC: 3.7 10*3/uL — ABNORMAL LOW (ref 4.0–10.5)
nRBC: 0 % (ref 0.0–0.2)

## 2022-12-23 LAB — CBG MONITORING, ED: Glucose-Capillary: 94 mg/dL (ref 70–99)

## 2022-12-23 LAB — CK: Total CK: 222 U/L (ref 49–397)

## 2022-12-23 LAB — LIPASE, BLOOD: Lipase: 113 U/L — ABNORMAL HIGH (ref 11–51)

## 2022-12-23 MED ORDER — POLYETHYLENE GLYCOL 3350 17 G PO PACK: 17.0000 g | PACK | Freq: Every day | ORAL | Status: DC | PRN

## 2022-12-23 MED ORDER — HEPARIN SODIUM (PORCINE) 5000 UNIT/ML IJ SOLN
5000.0000 [IU] | Freq: Three times a day (TID) | INTRAMUSCULAR | Status: DC
  Administered 2022-12-23 – 2022-12-26 (×7): 5000 [IU] via SUBCUTANEOUS
  Filled 2022-12-23 (×9): qty 1

## 2022-12-23 MED ORDER — DEXTROSE IN LACTATED RINGERS 5 % IV SOLN: INTRAVENOUS | Status: DC

## 2022-12-23 MED ORDER — DEXMEDETOMIDINE HCL IN NACL 400 MCG/100ML IV SOLN
0.0000 ug/kg/h | INTRAVENOUS | Status: DC
  Administered 2022-12-23: 0.4 ug/kg/h via INTRAVENOUS
  Administered 2022-12-23: 0.5 ug/kg/h via INTRAVENOUS
  Filled 2022-12-23 (×3): qty 100

## 2022-12-23 MED ORDER — CHLORHEXIDINE GLUCONATE CLOTH 2 % EX PADS
6.0000 | MEDICATED_PAD | Freq: Every day | CUTANEOUS | Status: DC
  Administered 2022-12-23: 6 via TOPICAL

## 2022-12-23 MED ORDER — FOLIC ACID 5 MG/ML IJ SOLN
1.0000 mg | Freq: Every day | INTRAMUSCULAR | Status: DC
  Administered 2022-12-23 – 2022-12-24 (×2): 1 mg via INTRAVENOUS
  Filled 2022-12-23 (×2): qty 0.2

## 2022-12-23 MED ORDER — ORAL CARE MOUTH RINSE: 15.0000 mL | OROMUCOSAL | Status: DC | PRN

## 2022-12-23 MED ORDER — LEVETIRACETAM IN NACL 500 MG/100ML IV SOLN
500.0000 mg | Freq: Two times a day (BID) | INTRAVENOUS | Status: DC
  Administered 2022-12-23 – 2022-12-24 (×3): 500 mg via INTRAVENOUS
  Filled 2022-12-23 (×3): qty 100

## 2022-12-23 MED ORDER — SODIUM CHLORIDE 0.9 % IV BOLUS
1000.0000 mL | Freq: Once | INTRAVENOUS | Status: AC
  Administered 2022-12-23: 1000 mL via INTRAVENOUS

## 2022-12-23 MED ORDER — DOCUSATE SODIUM 100 MG PO CAPS: 100.0000 mg | ORAL_CAPSULE | Freq: Two times a day (BID) | ORAL | Status: DC | PRN

## 2022-12-23 MED ORDER — LEVETIRACETAM IN NACL 500 MG/100ML IV SOLN: 500.0000 mg | Freq: Two times a day (BID) | INTRAVENOUS | Status: DC

## 2022-12-23 MED ORDER — BICTEGRAVIR-EMTRICITAB-TENOFOV 50-200-25 MG PO TABS
1.0000 | ORAL_TABLET | Freq: Every day | ORAL | Status: DC
  Administered 2022-12-23 – 2022-12-27 (×5): 1 via ORAL
  Filled 2022-12-23 (×5): qty 1

## 2022-12-23 NOTE — H&P (Signed)
NAME:  Gary [name], MRN:  [id number], DOB:  1985/12/10, LOS: 0 ADMISSION DATE:  12/22/2022 CONSULTATION DATE:  12/23/2022 REFERRING MD:  Leonette Monarch - EDP  CHIEF COMPLAINT:  EtOH intoxication/withdrawal   History of Present Illness:  37 year old man who presented to [place] ED 2/14 for fatigue, chest/abdominal pain and weakness in the setting of EtOH intoxication. PMHx significant for anxiety, bipolar 2 disorder, schizophrenia, PTSD, HIV, SDH, EtOH abuse c/b alcohol-induced pancreatitis and seizures in the setting of EtOH withdrawal (drinks ~1L liquor/day). Recently admitted to [facility] 2/5 - 2/13 for abdominal pain/vomiting in the setting of EtOH withdrawal requiring Precedex infusion and Librium/clonidine initiation; left AMA.   On ED presentation, patient reported that he left the hospital AMA earlier in the day 2/13 as he thought he could manage on his own. Upon leaving the hospital, patient reportedly consumed an unclear amount of a 24oz can of alcohol, felt generally weak and fatigued with recurrence of abdominal pain and decided to come back the hospital. Reported chest pain and epigastric abdominal pain on ED arrival. ED workup was notable for CBC at baseline (baseline Hgb ~12-13), lytes grossly WNL, AKI with Cr 1.93 (baseline 0.7), LFTs WNL. Lipase remained slightly elevated at 113 (108), trop mildly elevated 61 (74). Ethanol 52, UDS +benzos (on CIWA). CXR with mild, stable L basilar atelectasis vs. Infiltrate, CT Head NAICA but with atrophic changes advanced for age. CT A/P with contrast demonstrating mild acute pancreatitis, stable 78m pancreatic pseudocyst, hepatic steatosis, stable bladder wall thickening, moderate prostatomegaly.  PCCM consulted for ICU/SDU admission for Precedex infusion and EtOH withdrawal management.  Pertinent Medical History:   Past Medical History:  Diagnosis Date   Alcohol abuse    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   Anxiety    Bipolar 2 disorder (HCC)    HIV  (human immunodeficiency virus infection) ([place]    Pancreatitis    PTSD (post-traumatic stress disorder)    Schizophrenia ([place]    Seizures ([place]    Subdural hematoma ([place]    Significant Hospital Events: Including procedures, antibiotic start and stop dates in addition to other pertinent events   [facility]13 - [facility], [facility], [facility] pain. Ethanol positive, UDS +BZDs (CIWA), AKI with Cr 1.93. Trop mildly elevated, downtrending. CT Head NAICA. CT A/P demonstrating mild acute pancreatitis, stable 169mpancreatic pseudocyst, hepatic steatosis, stable bladder wall thickening, moderate prostatomegaly. 2/14 - PCCM consulted for ICU admission for EtOH withdrawal, Precedex gtt.  Interim History / Subjective:  PCCM consulted for ICU admission for EtOH intoxication/withdrawal Signed out AMA early afternoon 2/13  Objective:  Blood pressure 98/62, pulse 84, temperature 97.6 F (36.4 C), temperature source Oral, resp. rate 18, SpO2 97 %.        Intake/Output Summary (Last 24 hours) at 12/23/2022 0428 Last data filed at 12/23/2022 0258 Gross per 24 hour  Intake 3000 ml  Output --  Net 3000 ml   There were no vitals filed for this visit.  Physical Examination: General: Acutely ill-appearing laying supine with blankets covering head HEENT: [place]/AT, anicteric sclera, PERRL, dry but pink mucous membranes. Neuro:  awake and oriented but slow speech  Responds to verbal stimuli. Following commands consistently. Moves all 4 extremities spontaneously. Strength 4/5 in all extremities.   CV: RRR, no m/g/r. PULM: Breathing even and unlabored on RA. Lung fields CTAB. GI: Soft, nontender, nondistended. Normoactive bowel sounds. Extremities: no LE edema noted. Skin: Warm/dry, with red rash.  Resolved Hospital Problem List:    Assessment &  Plan:   EtOH intoxication versus withdrawal, recent delirium tremens Presented to [facility] for fatigue, generalized weakness and recurrent abdominal  pain after signing out [facility] 2/13 for same. Ethanol level 52, UDS +benzos (on CIWA). - Admit to ICU for close monitoring - Monitor for signs/symptoms of withdrawal - Precedex, wean as able - Consider Phenobarbital versus Librium initiation; of note also required clonidine taper during prior hospitalization - CIWA protocol with Ativan - Continue thiamine, folate - MV when able to tolerate PO - TOC consult for substance abuse resources/cessation counseling  Recent alcohol-induced pancreatitis Pancreatic pseudocyst, stable CT A/P with contrast demonstrating mild acute pancreatitis, stable 42m pancreatic pseudocyst, hepatic steatosis, stable bladder wall thickening, moderate prostatomegaly. - Trend lipase to normal, was decreasing 2/13 prior to signing out AMA - NPO, ADAT to CLD - MIVF with D5LR  AKI, likely ATN in the setting of dehydration/hypotension Admission Cr 1.93, baseline Cr 0.5-0.7. Hypotension improved - Trend BMP - Replete electrolytes as indicated - Monitor I&Os - MIVF as above - Avoid nephrotoxic agents as able - Ensure adequate renal perfusion  Elevated troponin, mild - likely secondary to demand from hypotension/tachycardia Trop 74 on admission, repeat 61. - Cardiac monitoring - Optimize electrolytes for K > 4, Mg > 2  HIV CD4 count 253 on 2/6. HIV RNA viral load 2230 09/2022. - Repeat HIV RNA - Continue Biktarvy  Bipolar 2 disorder Anxiety PTSD Schizophrenia - No home medications on med list - May benefit from Psych consult while in-house, if not previously established  Best Practice: (right click and "Reselect all SmartList Selections" daily)   Diet/type: NPO, ADAT to CLD as mental status permits DVT prophylaxis: SCDs, SQH GI prophylaxis: N/A Lines: N/A Foley:  N/A Code Status:  full code Last date of multidisciplinary goals of care discussion [Pending]  Labs:  CBC: Recent Labs  Lab 12/17/22 0305 12/18/22 0251 12/21/22 0256 12/22/22 0312  12/22/22 2024  WBC 3.3* 4.7 3.7* 3.7* 5.4  HGB 12.5* 13.2 12.4* 13.3 12.3*  HCT 38.3* 40.0 37.6* 40.3 38.2*  MCV 103.2* 100.8* 100.5* 101.8* 102.1*  PLT 84* 145* 213 246 [id number]  Basic Metabolic Panel: Recent Labs  Lab 12/17/22 0305 12/18/22 0251 12/20/22 0329 12/21/22 0256 12/22/22 0312 12/22/22 2024  NA 136 135 137 138 137 137  K 3.5 3.6 3.4* 3.3* 3.4* 4.0  CL 101 102 103 103 104 105  CO2 23 20* 24 25 23 22  $ GLUCOSE 86 78 113* 98 101* 77  BUN <5* 6 <5* <5* 6 10  CREATININE 0.50* 0.62 0.52* 0.58* 0.76 1.93*  CALCIUM 8.7* 8.7* 9.1 9.2 9.1 9.1  MG 2.1 1.7 2.0  --  1.8  --   PHOS  --  3.6  --   --   --   --    GFR: Estimated Creatinine Clearance: 43 mL/min (A) (by C-G formula based on SCr of 1.93 mg/dL (H)). Recent Labs  Lab 12/18/22 0251 12/21/22 0256 12/22/22 0312 12/22/22 2024 12/23/22 0007  WBC 4.7 3.7* 3.7* 5.4  --   LATICACIDVEN  --   --   --   --  1.3   Liver Function Tests: Recent Labs  Lab 12/17/22 0305 12/18/22 0251 12/21/22 0256 12/22/22 0312 12/22/22 2024  AST 52* 51* 46* 37 31  ALT 40 41 39 36 35  ALKPHOS 64 74 70 61 59  BILITOT 1.4* 1.3* 0.7 0.8 0.5  PROT 6.0* 6.7 6.6 6.7 6.6  ALBUMIN 3.0* 3.1* 3.2* 3.1* 3.3*   Recent Labs  Lab 12/22/22 0312  LIPASE 108*   Recent Labs  Lab 12/17/22 0305 12/23/22 0007  AMMONIA 30 26   ABG:    Component Value Date/Time   HCO3 24.8 07/15/2022 0251   TCO2 26 07/15/2022 0251   O2SAT 99 07/15/2022 0251    Coagulation Profile: No results for input(s): "INR", "PROTIME" in the last 168 hours.  Cardiac Enzymes: Recent Labs  Lab 12/23/22 0145  CKTOTAL 222   HbA1C: Hgb A1c MFr Bld  Date/Time Value Ref Range Status  12/15/2022 07:37 AM 5.3 4.8 - 5.6 % Final    Comment:    (NOTE) Pre diabetes:          5.7%-6.4%  Diabetes:              >6.4%  Glycemic control for   <7.0% adults with diabetes   07/26/2021 06:53 AM 4.3 (L) 4.8 - 5.6 % Final    Comment:    (NOTE) Pre diabetes:           5.7%-6.4%  Diabetes:              >6.4%  Glycemic control for   <7.0% adults with diabetes    CBG: Recent Labs  Lab 12/21/22 0756 12/21/22 1725 12/22/22 0846 12/22/22 2050 12/23/22 0257  GLUCAP 113* 98 100* 92 94   Review of Systems:   + for headache, weakness, nausea, tremors all other ros negative  Past Medical History:  He,  has a past medical history of Alcohol abuse, Alcohol-induced pancreatitis (04/16/2022), Anxiety, Bipolar 2 disorder ([place]), HIV (human immunodeficiency virus infection) ([place]), Pancreatitis, PTSD (post-traumatic stress disorder), Schizophrenia ([place]), Seizures ([place]), and Subdural hematoma ([place]).   Surgical History:   Past Surgical History:  Procedure Laterality Date   BIOPSY  04/19/2022   Procedure: BIOPSY;  Surgeon: Rush Landmark Telford Nab., MD;  Location: [place];  Service: Gastroenterology;;   ENTEROSCOPY N/A 04/19/2022   Procedure: ENTEROSCOPY;  Surgeon: Rush Landmark Telford Nab., MD;  Location: [place];  Service: Gastroenterology;  Laterality: N/A;   INCISION AND DRAINAGE PERIRECTAL ABSCESS N/A 09/24/2016   Procedure: IRRIGATION AND DEBRIDEMENT PERIRECTAL ABSCESS;  Surgeon: Clayburn Pert, MD;  Location: [facility];  Service: General;  Laterality: N/A;   none     Social History:   reports that he has been smoking cigarettes. He has been smoking an average of .5 packs per day. He has never used smokeless tobacco. He reports current alcohol use of about 105.0 standard drinks of alcohol per week. He reports current drug use. Drug: Methamphetamines.   Family History:  His family history includes Alcohol abuse in his father and mother; Cancer in an other family member; Colon cancer in an other family member; Other in an other family member.   Allergies: Allergies  Allergen Reactions   Tegretol [Carbamazepine] Other (See Comments)    Caused vertigo for 2 days after taking it   Caffeine Palpitations    Home Medications: Prior to Admission  medications   Medication Sig Start Date End Date Taking? Authorizing Provider  bictegravir-emtricitabine-tenofovir AF (BIKTARVY) 50-200-25 MG TABS tablet Take 1 tablet by mouth daily. 10/23/22   Shelly Coss, MD  folic acid (FOLVITE) 1 MG tablet Take 1 tablet (1 mg total) by mouth daily. Patient not taking: Reported on 12/14/2022 10/23/22   Shelly Coss, MD  levETIRAcetam (KEPPRA) 500 MG tablet Take 1 tablet (500 mg total) by mouth 2 (two) times daily. 10/23/22   Shelly Coss, MD  thiamine (VITAMIN B1) 100 MG tablet Take  1 tablet (100 mg total) by mouth daily. Patient not taking: Reported on 12/14/2022 10/23/22   Shelly Coss, MD  famotidine (PEPCID) 20 MG tablet Take 1 tablet (20 mg total) by mouth 2 (two) times daily. Patient not taking: Reported on 06/01/2019 05/14/19 06/02/19  Davonna Belling, MD    Critical care time: 54mns

## 2022-12-23 NOTE — Progress Notes (Addendum)
CCM interval progress note  Admitted to PCCM service this morning-- soft Bps, weakness, abd pain after leaving AMA 2/13 from ICU where he was being managed for etoh withdrawals. Bps improved with IVF and has been on precedex in ED. Imaging c/f mild pancreatitis.    Seen in follow up in ED   Sleeping comfortable. Awakens to touch, follows commands but drowsy Even unlabored respirations Rrr cap refill < 3 sec Abd is soft ndnt No acute joint deformity   Etoh intoxication  Recent Dts Weakness due to above  Mild alcohol Induced pancreatitis Pancreatic pseudocyst AKI HIV Bipolar 2 PTSD Anxiety  P -cont precedex for now. On Ativan per CIWA. Consider librium +/- clonidine  -micronutrient support  -awaiting ICU admission  -biktarvy -- will reach out to id, may want to follow him inpt  -cont mIVF (currently on a dextrose infusion but as he awakens more and can take POs, can change to LR)  -will order for AM labs    Additional critical care time: 17 minutes   Eliseo Gum MSN, AGACNP-BC [place] for pager  12/23/2022, 11:18 AM

## 2022-12-23 NOTE — ED Notes (Signed)
RN notified about pt b\p

## 2022-12-24 ENCOUNTER — Other Ambulatory Visit (HOSPITAL_COMMUNITY): Payer: Self-pay

## 2022-12-24 ENCOUNTER — Encounter (HOSPITAL_COMMUNITY): Payer: Self-pay | Admitting: Pulmonary Disease

## 2022-12-24 DIAGNOSIS — K852 Alcohol induced acute pancreatitis without necrosis or infection: Secondary | ICD-10-CM

## 2022-12-24 DIAGNOSIS — Z5900 Homelessness unspecified: Secondary | ICD-10-CM

## 2022-12-24 DIAGNOSIS — F10929 Alcohol use, unspecified with intoxication, unspecified: Secondary | ICD-10-CM

## 2022-12-24 DIAGNOSIS — F10939 Alcohol use, unspecified with withdrawal, unspecified: Secondary | ICD-10-CM

## 2022-12-24 DIAGNOSIS — B20 Human immunodeficiency virus [HIV] disease: Secondary | ICD-10-CM

## 2022-12-24 DIAGNOSIS — G40909 Epilepsy, unspecified, not intractable, without status epilepticus: Secondary | ICD-10-CM

## 2022-12-24 LAB — PHOSPHORUS: Phosphorus: 3.6 mg/dL (ref 2.5–4.6)

## 2022-12-24 LAB — CBC
HCT: 39 % (ref 39.0–52.0)
Hemoglobin: 12.8 g/dL — ABNORMAL LOW (ref 13.0–17.0)
MCH: 32.7 pg (ref 26.0–34.0)
MCHC: 32.8 g/dL (ref 30.0–36.0)
MCV: 99.5 fL (ref 80.0–100.0)
Platelets: 289 10*3/uL (ref 150–400)
RBC: 3.92 MIL/uL — ABNORMAL LOW (ref 4.22–5.81)
RDW: 13.3 % (ref 11.5–15.5)
WBC: 2.9 10*3/uL — ABNORMAL LOW (ref 4.0–10.5)
nRBC: 0 % (ref 0.0–0.2)

## 2022-12-24 LAB — BASIC METABOLIC PANEL
Anion gap: 7 (ref 5–15)
BUN: <5 mg/dL — ABNORMAL LOW (ref 6–20)
CO2: 25 mmol/L (ref 22–32)
Calcium: 8.6 mg/dL — ABNORMAL LOW (ref 8.9–10.3)
Chloride: 104 mmol/L (ref 98–111)
Creatinine, Ser: 0.63 mg/dL (ref 0.61–1.24)
GFR, Estimated: >60 mL/min (ref >60)
Glucose, Bld: 127 mg/dL — ABNORMAL HIGH (ref 70–99)
Potassium: 3.2 mmol/L — ABNORMAL LOW (ref 3.5–5.1)
Sodium: 136 mmol/L (ref 135–145)

## 2022-12-24 LAB — HIV-1 RNA QUANT-NO REFLEX-BLD
HIV 1 RNA Quant: 70 copies/mL
LOG10 HIV-1 RNA: 1.845 log10copy/mL

## 2022-12-24 LAB — MAGNESIUM: Magnesium: 1.6 mg/dL — ABNORMAL LOW (ref 1.7–2.4)

## 2022-12-24 MED ORDER — FOLIC ACID 1 MG PO TABS
1.0000 mg | ORAL_TABLET | Freq: Every day | ORAL | Status: DC
  Administered 2022-12-25 – 2022-12-27 (×3): 1 mg via ORAL
  Filled 2022-12-24 (×3): qty 1

## 2022-12-24 MED ORDER — LEVETIRACETAM 500 MG PO TABS
500.0000 mg | ORAL_TABLET | Freq: Two times a day (BID) | ORAL | Status: DC
  Administered 2022-12-24 – 2022-12-27 (×6): 500 mg via ORAL
  Filled 2022-12-24 (×6): qty 1

## 2022-12-24 MED ORDER — POTASSIUM CHLORIDE CRYS ER 20 MEQ PO TBCR
20.0000 meq | EXTENDED_RELEASE_TABLET | ORAL | Status: AC
  Administered 2022-12-24 (×2): 20 meq via ORAL
  Filled 2022-12-24 (×2): qty 1

## 2022-12-24 MED ORDER — LORAZEPAM 0.5 MG PO TABS
0.5000 mg | ORAL_TABLET | ORAL | Status: DC | PRN
  Administered 2022-12-25 (×2): 1 mg via ORAL
  Filled 2022-12-24 (×4): qty 2

## 2022-12-24 MED ORDER — ACETAMINOPHEN 500 MG PO TABS
1000.0000 mg | ORAL_TABLET | Freq: Three times a day (TID) | ORAL | Status: DC | PRN
  Administered 2022-12-24: 1000 mg via ORAL
  Filled 2022-12-24: qty 2

## 2022-12-24 MED ORDER — POTASSIUM CHLORIDE 10 MEQ/100ML IV SOLN
10.0000 meq | INTRAVENOUS | Status: AC
  Administered 2022-12-24 (×4): 10 meq via INTRAVENOUS
  Filled 2022-12-24 (×4): qty 100

## 2022-12-24 MED ORDER — MAGNESIUM SULFATE 4 GM/100ML IV SOLN
4.0000 g | Freq: Once | INTRAVENOUS | Status: AC
  Administered 2022-12-24: 4 g via INTRAVENOUS
  Filled 2022-12-24: qty 100

## 2022-12-24 MED ORDER — LACTATED RINGERS IV SOLN: INTRAVENOUS | Status: DC

## 2022-12-24 NOTE — Consult Note (Signed)
[facility] Face-to-Face Psychiatry Consult   Reason for Consult: alcohol detox; si with plan to drinking himself to death Referring Physician: Ander Slade Patient Identification: Gary [name] MRN:  [id number] Principal Diagnosis: <principal problem not specified> Diagnosis:  Active Problems:   Alcoholic intoxication with complication ([place])   Alcohol withdrawal (HCC)   Hypomagnesemia   Total Time spent with patient: 45 minutes  Subjective:   Gary [name] is a 37 y.o. male patient admitted with alcohol intoxication and depression.  Patient was initially admitted to [facility], for the above however due to history and concerns for possible withdrawal he was transferred to the emergency department.  Patient is well-known to our behavioral health service line, currently has 25 admissions in the last 6 months ED/IP for alcohol induced withdrawal, alcohol intoxication, alcohol abuse, and seizures.   Patient seen and assessed by this psychiatric nurse practitioner.  Gary [name] has a significant substance use and alcohol use history, has been formally diagnosed with  alcohol use disorder, polysubstance use disorder.  He currently denies any recent illicit substance use.  He reports his longest period of sobriety for 2023 is about 3 weeks in between inpatient rehab and emergency room visit.  He is currently hospitalized for alcohol withdrawal and history of severe DTs on day 4/day 5. No obvious seizure-like events have been noted thus far.  While he is within the time window to have a seizure in the setting of alcohol withdrawal, I believe that this is less likely given his clinical picture with stable vital signs and lack of withdrawal symptoms this morning.  Today is day 1, will continue to monitor from a distance as patient remains in window for worsening alcohol withdrawal symptoms.  Pt seen in afternoon states he is doing well at this time. He states he is only here to get  treated for nausea and vomiting, so that he can drink some more. He reports drinking about a half gallon today.  He has gained some weight and clinically has improved. Today he states he feels good and only wishes to have Ativan at this time.  He does have mild tremors on evaluation, but no acute agitation noted at this time. He has been known to develop hallucinations and psychosis, however he denies at this time. No SI, HI, AH/VH.    Patient's psychiatric condition has improved and is now although he remains in the window for acute Dt's.  He shows some evident signs of ongoing alcohol withdrawal as evident by reduce irritability, vomiting,  reduced tremors of hands. Patient may choose to leave prior to medical detox being completed, although he has some sense of awareness as he states " it was either come here or keep drinking and die." He has continued to deny all suicidal ideations, suicidal intent.  Patient does NOT appear to be future oriented as he has no plans to pursue outpatient psychiatric treatment for alcohol use disorder.     HPI: Gary [name] is a 37 y.o. male 37 year old man who presented to [facility] ED 2/14 for fatigue, chest/abdominal pain and weakness in the setting of EtOH intoxication. PMHx significant for anxiety, bipolar 2 disorder, schizophrenia, PTSD, HIV, SDH, EtOH abuse c/b alcohol-induced pancreatitis and seizures in the setting of EtOH withdrawal (drinks ~1L liquor/day). Recently admitted to [facility] 2/5 - 2/13 for abdominal pain/vomiting in the setting of EtOH withdrawal requiring Precedex infusion and Librium/clonidine initiation; left AMA.   Past Psychiatric History: Alcohol use disorder, PTSD, anxiety.  Previous psychiatric medication trials include Haldol, Librium, Ativan, phenobarbital.  He reports history of previous suicide attempts, by drinking himself to death at 1 point in time.  He denies any current and or previous outpatient psychiatric provider.  He denies any history of  psychiatric hospitalizations, however multiple inpatient rehabilitation admissions for alcohol use disorder.  He denies any recent of substance use.  Smokes about 4 to 5 cigarettes/day.  Risk to Self:  Denies Risk to Others:  Denies Prior Inpatient Therapy:  Denies, multiple inpatient rehabilitation admissions. Prior Outpatient Therapy:  Denies  Past Medical History:  Past Medical History:  Diagnosis Date   Alcohol abuse    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   Anxiety    Bipolar 2 disorder ([place])    HIV (human immunodeficiency virus infection) ([place])    Pancreatitis    PTSD (post-traumatic stress disorder)    Schizophrenia ([place])    Seizures ([place])    Subdural hematoma (HCC)     Past Surgical History:  Procedure Laterality Date   BIOPSY  04/19/2022   Procedure: BIOPSY;  Surgeon: Irving Copas., MD;  Location: [facility] ENDOSCOPY;  Service: Gastroenterology;;   ENTEROSCOPY N/A 04/19/2022   Procedure: ENTEROSCOPY;  Surgeon: Irving Copas., MD;  Location: [place];  Service: Gastroenterology;  Laterality: N/A;   INCISION AND DRAINAGE PERIRECTAL ABSCESS N/A 09/24/2016   Procedure: IRRIGATION AND DEBRIDEMENT PERIRECTAL ABSCESS;  Surgeon: Clayburn Pert, MD;  Location: [facility] ORS;  Service: General;  Laterality: N/A;   none     Family History:  Family History  Problem Relation Age of Onset   Alcohol abuse Mother    Alcohol abuse Father    Colon cancer Other    Other Other    Cancer Other    Family Psychiatric  History: Alcohol use in mother and father. Social History:  Social History   Substance and Sexual Activity  Alcohol Use Yes   Alcohol/week: 105.0 standard drinks of alcohol   Types: 105 Cans of beer per week     Social History   Substance and Sexual Activity  Drug Use Yes   Types: Methamphetamines   Comment: last used 04/10/2019    Social History   Socioeconomic History   Marital status: Single    Spouse name: Not on file   Number of children:  Not on file   Years of education: Not on file   Highest education level: Not on file  Occupational History   Occupation: unemployed  Tobacco Use   Smoking status: Every Day    Packs/day: 0.50    Types: Cigarettes   Smokeless tobacco: Never   Tobacco comments:    unable to smoke while incarcerated 6+ months 02/13/20  Vaping Use   Vaping Use: Never used  Substance and Sexual Activity   Alcohol use: Yes    Alcohol/week: 105.0 standard drinks of alcohol    Types: 105 Cans of beer per week   Drug use: Yes    Types: Methamphetamines    Comment: last used 04/10/2019   Sexual activity: Yes    Partners: Male, Male    Comment: declined condoms  03/2021  Other Topics Concern   Not on file  Social History Narrative   "Currently living on the streets"   Independent at baseline   Occasionally goes to the [facility] Determinants of Health   Financial Resource Strain: Not on file  Food Insecurity: Food Insecurity Present (12/24/2022)   Hunger Vital Sign    Worried About Running  Out of Food in the Last Year: Sometimes true    Ran Out of Food in the Last Year: Sometimes true  Transportation Needs: Unmet Transportation Needs (12/24/2022)   PRAPARE - Hydrologist (Medical): Yes    Lack of Transportation (Non-Medical): Yes  Physical Activity: Not on file  Stress: Not on file  Social Connections: Not on file   Additional Social History: He is homeless, unemployed seeking disability..    Allergies:   Allergies  Allergen Reactions   Tegretol [Carbamazepine] Other (See Comments)    Caused vertigo for 2 days after taking it   Caffeine Palpitations    Labs:  Results for orders placed or performed during the hospital encounter of 12/22/22 (from the past 48 hour(s))  CBC     Status: Abnormal   Collection Time: 12/22/22  8:24 PM  Result Value Ref Range   WBC 5.4 4.0 - 10.5 K/uL   RBC 3.74 (L) 4.22 - 5.81 MIL/uL   Hemoglobin 12.3 (L) 13.0 - 17.0 g/dL   HCT  38.2 (L) 39.0 - 52.0 %   MCV 102.1 (H) 80.0 - 100.0 fL   MCH 32.9 26.0 - 34.0 pg   MCHC 32.2 30.0 - 36.0 g/dL   RDW 13.7 11.5 - 15.5 %   Platelets 323 150 - 400 K/uL   nRBC 0.0 0.0 - 0.2 %    Comment: Performed at [facility], [place] [address] [place], [place] [address]  Comprehensive metabolic panel     Status: Abnormal   Collection Time: 12/22/22  8:24 PM  Result Value Ref Range   Sodium 137 135 - 145 mmol/L   Potassium 4.0 3.5 - 5.1 mmol/L   Chloride 105 98 - 111 mmol/L   CO2 22 22 - 32 mmol/L   Glucose, Bld 77 70 - 99 mg/dL    Comment: Glucose reference range applies only to samples taken after fasting for at least 8 hours.   BUN 10 6 - 20 mg/dL   Creatinine, Ser 1.93 (H) 0.61 - 1.24 mg/dL    Comment: DELTA CHECK NOTED   Calcium 9.1 8.9 - 10.3 mg/dL   Total Protein 6.6 6.5 - 8.1 g/dL   Albumin 3.3 (L) 3.5 - 5.0 g/dL   AST 31 15 - 41 U/L   ALT 35 0 - 44 U/L   Alkaline Phosphatase 59 38 - 126 U/L   Total Bilirubin 0.5 0.3 - 1.2 mg/dL   GFR, Estimated 45 (L) >60 mL/min    Comment: (NOTE) Calculated using the CKD-EPI Creatinine Equation (2021)    Anion gap 10 5 - 15    Comment: Performed at [facility], [place] [address] [place], [place] [address]  POC CBG, ED     Status: None   Collection Time: 12/22/22  8:50 PM  Result Value Ref Range   Glucose-Capillary 92 70 - 99 mg/dL    Comment: Glucose reference range applies only to samples taken after fasting for at least 8 hours.  Ethanol     Status: Abnormal   Collection Time: 12/22/22  9:29 PM  Result Value Ref Range   Alcohol, Ethyl (B) 52 (H) <10 mg/dL    Comment: (NOTE) Lowest detectable limit for serum alcohol is 10 mg/dL.  For medical purposes only. Performed at [facility], [place] [address] [place], [place] [address]   Ammonia     Status: None   Collection Time: 12/23/22 12:07 AM  Result Value  Ref Range   Ammonia 26 9 - 35 umol/L    Comment: Performed at  [facility], [place] [address] [place], [place] [address]  Lactic acid, plasma     Status: None   Collection Time: 12/23/22 12:07 AM  Result Value Ref Range   Lactic Acid, Venous 1.3 0.5 - 1.9 mmol/L    Comment: Performed at [facility], [place] [address], [place], [place] [address]  Urine rapid drug screen (hosp performed)     Status: Abnormal   Collection Time: 12/23/22 12:17 AM  Result Value Ref Range   Opiates NONE DETECTED NONE DETECTED   Cocaine NONE DETECTED NONE DETECTED   Benzodiazepines POSITIVE (A) NONE DETECTED   Amphetamines NONE DETECTED NONE DETECTED   Tetrahydrocannabinol NONE DETECTED NONE DETECTED   Barbiturates NONE DETECTED NONE DETECTED    Comment: (NOTE) DRUG SCREEN FOR MEDICAL PURPOSES ONLY.  IF CONFIRMATION IS NEEDED FOR ANY PURPOSE, NOTIFY LAB WITHIN 5 DAYS.  LOWEST DETECTABLE LIMITS FOR URINE DRUG SCREEN Drug Class                     Cutoff (ng/mL) Amphetamine and metabolites    1000 Barbiturate and metabolites    200 Benzodiazepine                 200 Opiates and metabolites        300 Cocaine and metabolites        300 THC                            50 Performed at [facility], [place] [address], [place], [place] [address]   Urinalysis, Routine w reflex microscopic -Urine, Clean Catch     Status: Abnormal   Collection Time: 12/23/22 12:17 AM  Result Value Ref Range   Color, Urine YELLOW YELLOW   APPearance HAZY (A) CLEAR   Specific Gravity, Urine 1.014 1.005 - 1.030   pH 5.0 5.0 - 8.0   Glucose, UA NEGATIVE NEGATIVE mg/dL   Hgb urine dipstick NEGATIVE NEGATIVE   Bilirubin Urine NEGATIVE NEGATIVE   Ketones, ur NEGATIVE NEGATIVE mg/dL   Protein, ur NEGATIVE NEGATIVE mg/dL   Nitrite NEGATIVE NEGATIVE   Leukocytes,Ua NEGATIVE NEGATIVE    Comment: Performed at [place] [address], [place], [place] [address]  Troponin I (High Sensitivity)     Status: Abnormal    Collection Time: 12/23/22  1:45 AM  Result Value Ref Range   Troponin I (High Sensitivity) 74 (H) <18 ng/L    Comment: (NOTE) Elevated high sensitivity troponin I (hsTnI) values and significant  changes across serial measurements may suggest ACS but many other  chronic and acute conditions are known to elevate hsTnI results.  Refer to the "Links" section for chest pain algorithms and additional  guidance. Performed at [facility], [place] [address] [place], [place] [address]   CK     Status: None   Collection Time: 12/23/22  1:45 AM  Result Value Ref Range   Total CK 222 49 - 397 U/L    Comment: Performed at [facility], [place] [address], [place], [place] [address]  POC CBG, ED     Status: None   Collection Time: 12/23/22  2:57 AM  Result Value Ref Range   Glucose-Capillary 94 70 - 99 mg/dL    Comment: Glucose reference range applies only to  samples taken after fasting for at least 8 hours.  Troponin I (High Sensitivity)     Status: Abnormal   Collection Time: 12/23/22  3:12 AM  Result Value Ref Range   Troponin I (High Sensitivity) 61 (H) <18 ng/L    Comment: (NOTE) Elevated high sensitivity troponin I (hsTnI) values and significant  changes across serial measurements may suggest ACS but many other  chronic and acute conditions are known to elevate hsTnI results.  Refer to the "Links" section for chest pain algorithms and additional  guidance. Performed at [facility], [place] [address], [place], [place] [address]   Lipase, blood     Status: Abnormal   Collection Time: 12/23/22  3:12 AM  Result Value Ref Range   Lipase 113 (H) 11 - 51 U/L    Comment: Performed at [facility], [place] [address] [place], [place] [address]  CBC     Status: Abnormal   Collection Time: 12/23/22  5:16 AM  Result Value Ref Range   WBC 3.7 (L) 4.0 - 10.5 K/uL   RBC 3.42 (L) 4.22 - 5.81 MIL/uL   Hemoglobin 11.5 (L) 13.0 - 17.0  g/dL   HCT 34.9 (L) 39.0 - 52.0 %   MCV 102.0 (H) 80.0 - 100.0 fL   MCH 33.6 26.0 - 34.0 pg   MCHC 33.0 30.0 - 36.0 g/dL   RDW 14.0 11.5 - 15.5 %   Platelets 272 150 - 400 K/uL   nRBC 0.0 0.0 - 0.2 %    Comment: Performed at [facility], [place] [address], [place], [place] [address]  Lactic acid, plasma     Status: None   Collection Time: 12/23/22  3:01 PM  Result Value Ref Range   Lactic Acid, Venous 0.8 0.5 - 1.9 mmol/L    Comment: Performed at [facility], [place] [address], [place], [place] [address]  Comprehensive metabolic panel     Status: Abnormal   Collection Time: 12/23/22  3:01 PM  Result Value Ref Range   Sodium 136 135 - 145 mmol/L   Potassium 3.7 3.5 - 5.1 mmol/L   Chloride 110 98 - 111 mmol/L   CO2 21 (L) 22 - 32 mmol/L   Glucose, Bld 117 (H) 70 - 99 mg/dL    Comment: Glucose reference range applies only to samples taken after fasting for at least 8 hours.   BUN <5 (L) 6 - 20 mg/dL   Creatinine, Ser 0.62 0.61 - 1.24 mg/dL   Calcium 8.0 (L) 8.9 - 10.3 mg/dL   Total Protein 6.0 (L) 6.5 - 8.1 g/dL   Albumin 2.9 (L) 3.5 - 5.0 g/dL   AST 25 15 - 41 U/L   ALT 27 0 - 44 U/L   Alkaline Phosphatase 62 38 - 126 U/L   Total Bilirubin 0.6 0.3 - 1.2 mg/dL   GFR, Estimated >60 >60 mL/min    Comment: (NOTE) Calculated using the CKD-EPI Creatinine Equation (2021)    Anion gap 5 5 - 15    Comment: Performed at [facility], [place] [address], [place], [place] [address]  Magnesium     Status: None   Collection Time: 12/23/22  3:01 PM  Result Value Ref Range   Magnesium 1.8 1.7 - 2.4 mg/dL    Comment: Performed at [facility], [place] [address], [place], [place] [address]  Phosphorus     Status: None   Collection Time: 12/23/22  3:01  PM  Result Value Ref Range   Phosphorus 3.8 2.5 - 4.6 mg/dL    Comment: Performed at [facility], [place] [address], [place], [place] [address]  CBC      Status: Abnormal   Collection Time: 12/24/22  2:55 AM  Result Value Ref Range   WBC 2.9 (L) 4.0 - 10.5 K/uL   RBC 3.92 (L) 4.22 - 5.81 MIL/uL   Hemoglobin 12.8 (L) 13.0 - 17.0 g/dL   HCT 39.0 39.0 - 52.0 %   MCV 99.5 80.0 - 100.0 fL   MCH 32.7 26.0 - 34.0 pg   MCHC 32.8 30.0 - 36.0 g/dL   RDW 13.3 11.5 - 15.5 %   Platelets 289 150 - 400 K/uL   nRBC 0.0 0.0 - 0.2 %    Comment: Performed at [facility], [place] [address], [place], [place] [id number]  Basic metabolic panel     Status: Abnormal   Collection Time: 12/24/22  2:55 AM  Result Value Ref Range   Sodium 136 135 - 145 mmol/L   Potassium 3.2 (L) 3.5 - 5.1 mmol/L   Chloride 104 98 - 111 mmol/L   CO2 25 22 - 32 mmol/L   Glucose, Bld 127 (H) 70 - 99 mg/dL    Comment: Glucose reference range applies only to samples taken after fasting for at least 8 hours.   BUN <5 (L) 6 - 20 mg/dL   Creatinine, Ser 0.63 0.61 - 1.24 mg/dL   Calcium 8.6 (L) 8.9 - 10.3 mg/dL   GFR, Estimated >60 >60 mL/min    Comment: (NOTE) Calculated using the CKD-EPI Creatinine Equation (2021)    Anion gap 7 5 - 15    Comment: Performed at [facility], [place] [address], [place], [place] [address]  Magnesium     Status: Abnormal   Collection Time: 12/24/22  2:55 AM  Result Value Ref Range   Magnesium 1.6 (L) 1.7 - 2.4 mg/dL    Comment: Performed at [facility], [place] [address], [place], [place] [address]  Phosphorus     Status: None   Collection Time: 12/24/22  2:55 AM  Result Value Ref Range   Phosphorus 3.6 2.5 - 4.6 mg/dL    Comment: Performed at [facility], [place] [address] [place], [place] [address]    Current Facility-Administered Medications  Medication Dose Route Frequency Provider Last Rate Last Admin   bictegravir-emtricitabine-tenofovir AF (BIKTARVY) 50-200-25 MG per tablet 1 tablet  1 tablet Oral Daily [place] Crane M, PA-C   1 tablet at 12/24/22 1018   docusate  sodium (COLACE) capsule 100 mg  100 mg Oral BID PRN Lestine Mount, PA-C       [START ON 99991111 folic acid (FOLVITE) tablet 1 mg  1 mg Oral Daily Margaretha Seeds, MD       heparin injection 5,000 Units  5,000 Units Subcutaneous Q8H [place] Crane M, [place]   5,000 Units at 12/24/22 [id number]   lactated ringers infusion   Intravenous Continuous Cristal Generous, NP 75 mL/hr at 12/24/22 1020 New Bag at 12/24/22 1020   levETIRAcetam (KEPPRA) tablet 500 mg  500 mg Oral BID Margaretha Seeds, MD       LORazepam (ATIVAN) injection 0-4 mg  0-4 mg Intravenous Q6H Marcello Fennel, PA-C   3 mg at 12/24/22 1103   Or   LORazepam (ATIVAN) tablet 0-4 mg  0-4 mg Oral Q6H Marcello Fennel, PA-C  2 mg at 12/22/22 2359   [START ON 12/25/2022] LORazepam (ATIVAN) injection 0-4 mg  0-4 mg Intravenous Q12H Marcello Fennel, PA-C       Or   [START ON 12/25/2022] LORazepam (ATIVAN) tablet 0-4 mg  0-4 mg Oral Q12H Marcello Fennel, PA-C       Oral care mouth rinse  15 mL Mouth Rinse PRN Margaretha Seeds, MD       polyethylene glycol (MIRALAX / GLYCOLAX) packet 17 g  17 g Oral Daily PRN [place] Crane M, PA-C       thiamine (VITAMIN B1) tablet 100 mg  100 mg Oral Daily Marcello Fennel, PA-C   100 mg at 12/24/22 1018    Musculoskeletal: Strength & Muscle Tone: within normal limits Gait & Station: normal Patient leans: N/A            Psychiatric Specialty Exam:  Presentation  General Appearance: Appropriate for Environment; Casual  Eye Contact:Fair  Speech:Clear and Coherent; Normal Rate  Speech Volume:Normal  Handedness:Right   Mood and Affect  Mood:Euthymic  Affect:Appropriate; Congruent   Thought Process  Thought Processes:Coherent; Linear  Descriptions of Associations:Intact  Orientation:Full (Time, Place and Person)  Thought Content:Logical  History of Schizophrenia/Schizoaffective disorder:Yes  Duration of Psychotic Symptoms:No data  recorded  Hallucinations:Hallucinations: None   Ideas of Reference:None  Suicidal Thoughts:Suicidal Thoughts: No   Homicidal Thoughts:Homicidal Thoughts: No    Sensorium  Memory:Immediate Fair; Recent Fair; Remote [place]  Insight:Fair   Executive Functions  Concentration:Good  Attention Span:Good  [employer] [facility] of Knowledge:Good  Language:Good   Psychomotor Activity  Psychomotor Activity:Psychomotor Activity: Normal    Assets  Assets:Communication Skills; Desire for Improvement; Physical Health   Sleep  Sleep:Sleep: Fair    Physical Exam: Physical Exam Vitals and nursing note reviewed.  Constitutional:      Appearance: Normal appearance. He is normal weight. He is not ill-appearing.  HENT:     Head: Normocephalic and atraumatic.     Mouth/Throat:     Pharynx: Oropharynx is clear.  Eyes:     Pupils: Pupils are equal, round, and reactive to light.  Musculoskeletal:        General: Normal range of motion.  Skin:    General: Skin is warm and dry.  Neurological:     General: No focal deficit present.     Mental Status: He is alert. Mental status is at baseline.  Psychiatric:        Attention and Perception: Attention normal.        Mood and Affect: Mood is anxious.        Speech: Speech normal.        Behavior: Behavior is cooperative.        Thought Content: Thought content normal.        Cognition and Memory: Cognition normal.        Judgment: Judgment normal.    Review of Systems  Constitutional:  Positive for malaise/fatigue and weight loss.  HENT: Negative.    Eyes: Negative.   Respiratory: Negative.    Cardiovascular: Negative.   Gastrointestinal:  Positive for heartburn, nausea and vomiting.  Musculoskeletal:  Positive for myalgias.  Skin: Negative.   Neurological:  Positive for tremors, seizures and headaches.  Psychiatric/Behavioral:  Positive for depression and substance abuse. Negative for suicidal ideas.     Blood pressure (!) 102/90, pulse 84, temperature 98.6 F (37 C), temperature source Oral, resp. rate 18, height 6' (1.829 m), weight 66.1  kg, SpO2 100 %. Body mass index is 19.77 kg/m.  Patient's presentation is most consistent with severe alcohol use disorder, exacerbation of chronic illness.  Patient's homelessness and impaired access to primary CARE/outpatient psychiatry increases the complexity of managing his current presentation with alcohol intoxication and request for detox.  Based off patient's current presentation, he continues to deny outpatient psychiatric services, although he requests medical detox and assistance with referral to [facility].  He denies any acute danger to himself and or others, despite multiple risk factors due to his chronic alcohol use.  On today's evaluation patient is currently lucid, alert and oriented, calm and cooperative no current signs of delirium. As of this note patient has only received 25 mg of Librium and 1 mg of Ativan.  .   Treatment Plan Summary: Treat alcohol use disorder with Ativan, Librium, and CIWA detox.   Consider starting Precedex or phenobarbital, if patient becomes acutely psychotic or begins to display delirium tremens. -Currently does not meet criteria for involuntary commitment.  As he denies being a danger to himself and or others and has been consistently future oriented for multiple days.  -TOC consult for inpatient rehabilitation/substance abuse resources, although patient shows no interest. -Denied interest in starting Naltrexone, phenobarbital taper, and/or gabapentin.  -Labs reviewed and assessed, remains with elevated liver enzymes due to her chronic alcohol use.  Blood alcohol level 52 on admission.    Psychiatry signing off.   Disposition: Does not meet criteria for inpatient psychiatric admission.    Suella Broad, FNP 12/24/2022 3:42 PM

## 2022-12-24 NOTE — Progress Notes (Addendum)
NAME:  Gary [name], MRN:  [id number], DOB:  [birth year], LOS: 1 ADMISSION DATE:  12/22/2022 CONSULTATION DATE:  12/23/2022 REFERRING MD:  Leonette Monarch - EDP  CHIEF COMPLAINT:  EtOH intoxication/withdrawal   History of Present Illness:  37 year old man who presented to [place] ED 2/14 for fatigue, chest/abdominal pain and weakness in the setting of EtOH intoxication. PMHx significant for anxiety, bipolar 2 disorder, schizophrenia, PTSD, HIV, SDH, EtOH abuse c/b alcohol-induced pancreatitis and seizures in the setting of EtOH withdrawal (drinks ~1L liquor/day). Recently admitted to [facility] 2/5 - 2/13 for abdominal pain/vomiting in the setting of EtOH withdrawal requiring Precedex infusion and Librium/clonidine initiation; left AMA.   On ED presentation, patient reported that he left the hospital AMA earlier in the day 2/13 as he thought he could manage on his own. Upon leaving the hospital, patient reportedly consumed an unclear amount of a 24oz can of alcohol, felt generally weak and fatigued with recurrence of abdominal pain and decided to come back the hospital. Reported chest pain and epigastric abdominal pain on ED arrival. ED workup was notable for CBC at baseline (baseline Hgb ~12-13), lytes grossly WNL, AKI with Cr 1.93 (baseline 0.7), LFTs WNL. Lipase remained slightly elevated at 113 (108), trop mildly elevated 61 (74). Ethanol 52, UDS +benzos (on CIWA). CXR with mild, stable L basilar atelectasis vs. Infiltrate, CT Head NAICA but with atrophic changes advanced for age. CT A/P with contrast demonstrating mild acute pancreatitis, stable 76m pancreatic pseudocyst, hepatic steatosis, stable bladder wall thickening, moderate prostatomegaly.  PCCM consulted for ICU/SDU admission for Precedex infusion and EtOH withdrawal management.  Pertinent Medical History:   Past Medical History:  Diagnosis Date   Alcohol abuse    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   Anxiety    Bipolar 2 disorder (HCC)    HIV  (human immunodeficiency virus infection) ([place]    Pancreatitis    PTSD (post-traumatic stress disorder)    Schizophrenia ([place]    Seizures ([place]    Subdural hematoma ([place]    Significant Hospital Events: Including procedures, antibiotic start and stop dates in addition to other pertinent events   [facility], [facility]. Ethanol positive, UDS +BZDs (CIWA), AKI with Cr 1.93. Trop mildly elevated, downtrending. CT Head NAICA. CT A/P demonstrating mild acute pancreatitis, stable 164mpancreatic pseudocyst, hepatic steatosis, stable bladder wall thickening, moderate prostatomegaly. 2/14 - PCCM consulted for ICU admission for EtOH withdrawal, Precedex gtt. 2/15 off dex. Getting mag and K. Adv diet, transfer [facility] of ICU, [facility]    Interim History / Subjective:   Weaned off dex  This morning had a couple of soft pressures   Objective:  Blood pressure 113/88, pulse (!) 58, temperature 98.7 F (37.1 C), temperature source Oral, resp. rate (!) 22, weight 66.4 kg, SpO2 97 %.        Intake/Output Summary (Last 24 hours) at 12/24/2022 0712 Last data filed at 12/24/2022 0653 Gross per 24 hour  Intake 2092.57 ml  Output 1750 ml  Net 342.57 ml   Filed Weights   12/24/22 0500  Weight: 66.4 kg    Physical Examination:  General: Chronically ill appearing m NAD  HEENT: temporal muscle wasting. Anicteric sclera   Neuro: AAOx3 following commands no focal deficit  CV: rr s1s2 cap refill < 3 sec  PULM: ctab  GI: thin ndnt  Extremities: symmetrically decr muscle bulk  Skin: erythematous rash   Resolved Hospital Problem List:   AKI  Assessment & Plan:   Acute metabolic encephalopathy  Etoh abuse with acute etoh intoxication  Polysubstance abuse  Etoh induced pancreatitis, mild  Pancreatic pseudocyst  HIV  Leukopenia Anemia, mild  Hypomagnesemia  Hypokalemia  Possible PTSD / bipolar 2 / schizophrenia  Hx  seizures Malnutrition  -Fq admits for etoh. Left AMA 2/13  before presenting back to ED later that evening  -smokes crack, meth, weed. snorts some oxy and meth. No fent, no IVDU P -wean off dex -PRN bzd  -micronutrient support -adv diet as tolerated -- will change IVF accordingly  - cont biktarvy -- ID to see this admission  - replace mag, k -- check AM lytes  -Keppta BID  - TOC, psych consult  -transfer out of ICU 2/15    Best Practice: (right click and "Reselect all SmartList Selections" daily)   Diet/type: Adv to reg diet  DVT prophylaxis: SCDs, SQH GI prophylaxis: N/A Lines: N/A Foley:  N/A Code Status:  full code Last date of multidisciplinary goals of care discussion [Pending]  Labs:  CBC: Recent Labs  Lab 12/21/22 0256 12/22/22 0312 12/22/22 2024 12/23/22 0516 12/24/22 0255  WBC 3.7* 3.7* 5.4 3.7* 2.9*  HGB 12.4* 13.3 12.3* 11.5* 12.8*  HCT 37.6* 40.3 38.2* 34.9* 39.0  MCV 100.5* 101.8* 102.1* 102.0* 99.5  PLT 213 246 323 272 [id number]   Basic Metabolic Panel: Recent Labs  Lab 12/18/22 0251 12/20/22 0329 12/21/22 0256 12/22/22 0312 12/22/22 2024 12/23/22 1501 12/24/22 0255  NA 135 137 138 137 137 136 136  K 3.6 3.4* 3.3* 3.4* 4.0 3.7 3.2*  CL 102 103 103 104 105 110 104  CO2 20* 24 25 23 22 $ 21* 25  GLUCOSE 78 113* 98 101* 77 117* 127*  BUN 6 <5* <5* 6 10 <5* <5*  CREATININE 0.62 0.52* 0.58* 0.76 1.93* 0.62 0.63  CALCIUM 8.7* 9.1 9.2 9.1 9.1 8.0* 8.6*  MG 1.7 2.0  --  1.8  --  1.8 1.6*  PHOS 3.6  --   --   --   --  3.8 3.6   GFR: Estimated Creatinine Clearance: 119.9 mL/min (by C-G formula based on SCr of 0.63 mg/dL). Recent Labs  Lab 12/22/22 0312 12/22/22 2024 12/23/22 0007 12/23/22 0516 12/23/22 1501 12/24/22 0255  WBC 3.7* 5.4  --  3.7*  --  2.9*  LATICACIDVEN  --   --  1.3  --  0.8  --    Liver Function Tests: Recent Labs  Lab 12/18/22 0251 12/21/22 0256 12/22/22 0312 12/22/22 2024 12/23/22 1501  AST 51* 46* 37 31 25  ALT 41 39  36 35 27  ALKPHOS 74 70 61 59 62  BILITOT 1.3* 0.7 0.8 0.5 0.6  PROT 6.7 6.6 6.7 6.6 6.0*  ALBUMIN 3.1* 3.2* 3.1* 3.3* 2.9*   Recent Labs  Lab 12/22/22 0312 12/23/22 0312  LIPASE 108* 113*   Recent Labs  Lab 12/23/22 0007  AMMONIA 26   ABG:    Component Value Date/Time   HCO3 24.8 07/15/2022 0251   TCO2 26 07/15/2022 0251   O2SAT 99 07/15/2022 0251    Coagulation Profile: No results for input(s): "INR", "PROTIME" in the last 168 hours.  Cardiac Enzymes: Recent Labs  Lab 12/23/22 0145  CKTOTAL 222   HbA1C: Hgb A1c MFr Bld  Date/Time Value Ref Range Status  12/15/2022 07:37 AM 5.3 4.8 - 5.6 % Final    Comment:    (NOTE) Pre diabetes:  5.7%-6.4%  Diabetes:              >6.4%  Glycemic control for   <7.0% adults with diabetes   07/26/2021 06:53 AM 4.3 (L) 4.8 - 5.6 % Final    Comment:    (NOTE) Pre diabetes:          5.7%-6.4%  Diabetes:              >6.4%  Glycemic control for   <7.0% adults with diabetes    CBG: Recent Labs  Lab 12/21/22 0756 12/21/22 1725 12/22/22 0846 12/22/22 2050 12/23/22 0257  GLUCAP 113* 98 100* 92 94    CCT: n/a   Eliseo Gum MSN, AGACNP-BC [place] for pager  12/24/2022, 9:22 AM

## 2022-12-24 NOTE — Consult Note (Addendum)
Date of Admission:  12/22/2022          Reason for Consult: Poorly controlled HIV disease with symptoms with non adherence to therapy    Referring Provider: [name], MD   Assessment:  Poorly controlled HIV with no income,).  Lab Results  Component Value Date   HIV1RNAQUANT 70 12/23/2022   HIV1RNAQUANT 2,230 09/12/2022   HIV1RNAQUANT <20 05/20/2022   Though better controlled on this admission   no insurance= should qualify for Medicaid Homelessness Heavy alcohol abuse with recent admission for alcohol withdrawal and then alcohol intoxication Seizure disorder   Plan:  Check HIV RNA quant today and order HIV genotypte, INSTI R tomorrow Continue Biktarvy and will be able to get him 30 days via [employer] from [employer]--but he needs long term plan and Medicaid would seem likely given no income and no insurance--I have put in consult to CM re this. Certainly the HMAP program is something he would qualify for as well but he former would help with cost of covering his frequent admissions Will see if [name] from [facility] may be able to work with him as a Engineer, materials in short term   Active Problems:   Alcoholic intoxication with complication ([place])   Alcohol withdrawal ([place])   Hypomagnesemia   Scheduled Meds:  bictegravir-emtricitabine-tenofovir AF  1 tablet Oral Daily   Chlorhexidine Gluconate Cloth  6 each Topical Daily   [START ON 99991111 folic acid  1 mg Oral Daily   heparin  5,000 Units Subcutaneous Q8H   levETIRAcetam  500 mg Oral BID   LORazepam  0-4 mg Intravenous Q6H   Or   LORazepam  0-4 mg Oral Q6H   [START ON 12/25/2022] LORazepam  0-4 mg Intravenous Q12H   Or   [START ON 12/25/2022] LORazepam  0-4 mg Oral Q12H   thiamine  100 mg Oral Daily   Continuous Infusions:  lactated ringers 75 mL/hr at 12/24/22 1020   PRN Meds:.docusate sodium, mouth rinse, polyethylene glycol  HPI: Gary [name] is a 37 y.o.  male with uncontrolled HIV with problems with heavy alcoholism whom I met in 2022 when he had walked to our clinic.  Switch him from [place] to [place] but he has had inconsistent adherence and not been seen in the clinic frequently at all.  What he has done in the interim quite consistently as heavily drink alcohol and has multiple admissions to multiple ERs and hospitals for alcohol withdrawal and alcohol related pathology.   Was most recently seen by [name] in June 2023 and then [name] in July 2023  Only he tells me that he had some BIKTARVY in a bag on a particular street but he is not sure where it is.  He is homeless and been living on the street.  Is not clear to me if he currently is enrolled in an HIV medication assistance program.  He talked about being seen in the clinic in [place] I know there is a [employer] clinic in [place] that [place] but the patient does not seem to know if he has been seen there or not  We can certainly procure him Biktarvy from [place] but he should ideally be enrolled into Medicaid which would also help with the frequent cost of his admissions every set to [name] our [place] counselor with him [place] to see if he can work with [name] and put in [employer]  consult for Medicaid.  I spent 84 minutes with the patient including than 50% of the time in face to face counseling of the patient re his HIV disease, alcoholism, seizure disorder,  along with review of medical records in preparation for the visit and during the visit and in coordination of nis care.   Review of Systems: Review of Systems  Constitutional:  Positive for malaise/fatigue. Negative for chills, fever and weight loss.  HENT:  Negative for congestion and sore throat.   Eyes:  Negative for blurred vision and photophobia.  Respiratory:  Negative for cough, shortness of breath and wheezing.   Cardiovascular:  Negative for chest pain, palpitations and leg swelling.   Gastrointestinal:  Positive for nausea and vomiting. Negative for abdominal pain, blood in stool, constipation, diarrhea, heartburn and melena.  Genitourinary:  Negative for dysuria, flank pain and hematuria.  Musculoskeletal:  Positive for myalgias. Negative for back pain, falls and joint pain.  Skin:  Negative for itching and rash.  Neurological:  Negative for dizziness, focal weakness, loss of consciousness, weakness and headaches.  Endo/Heme/Allergies:  Does not bruise/bleed easily.  Psychiatric/Behavioral:  Positive for depression and substance abuse. Negative for suicidal ideas. The patient is nervous/anxious. The patient does not have insomnia.     Past Medical History:  Diagnosis Date   Alcohol abuse    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   Anxiety    Bipolar 2 disorder (HCC)    HIV (human immunodeficiency virus infection) ([place])    Pancreatitis    PTSD (post-traumatic stress disorder)    Schizophrenia ([place])    Seizures ([place])    Subdural hematoma (HCC)     Social History   Tobacco Use   Smoking status: Every Day    Packs/day: 0.50    Types: Cigarettes   Smokeless tobacco: Never   Tobacco comments:    unable to smoke while incarcerated 6+ months 02/13/20  Vaping Use   Vaping Use: Never used  Substance Use Topics   Alcohol use: Yes    Alcohol/week: 105.0 standard drinks of alcohol    Types: 105 Cans of beer per week   Drug use: Yes    Types: Methamphetamines    Comment: last used 04/10/2019    Family History  Problem Relation Age of Onset   Alcohol abuse Mother    Alcohol abuse Father    Colon cancer Other    Other Other    Cancer Other    Allergies  Allergen Reactions   Tegretol [Carbamazepine] Other (See Comments)    Caused vertigo for 2 days after taking it   Caffeine Palpitations    OBJECTIVE: Blood pressure 104/77, pulse 94, temperature 98.1 F (36.7 C), temperature source Oral, resp. rate 18, weight 66.4 kg, SpO2 97 %.  Physical  Exam Constitutional:      Appearance: He is well-developed and underweight. He is ill-appearing.  HENT:     Head: Normocephalic and atraumatic.  Eyes:     Conjunctiva/sclera: Conjunctivae normal.  Cardiovascular:     Rate and Rhythm: Regular rhythm. Tachycardia present.     Heart sounds: No murmur heard.    No friction rub. No gallop.  Pulmonary:     Effort: Pulmonary effort is normal. No respiratory distress.     Breath sounds: No stridor. No wheezing or rhonchi.  Abdominal:     General: There is no distension.     Palpations: Abdomen is soft.  Musculoskeletal:        General: No tenderness. Normal range  of motion.     Cervical back: Normal range of motion and neck supple.  Skin:    General: Skin is warm and dry.     Coloration: Skin is not pale.     Findings: No erythema or rash.  Neurological:     General: No focal deficit present.     Mental Status: He is alert and oriented to person, place, and time.  Psychiatric:        Mood and Affect: Mood normal.        Behavior: Behavior normal.        Thought Content: Thought content normal.        Judgment: Judgment normal.     Lab Results Lab Results  Component Value Date   WBC 2.9 (L) 12/24/2022   HGB 12.8 (L) 12/24/2022   HCT 39.0 12/24/2022   MCV 99.5 12/24/2022   PLT 289 12/24/2022    Lab Results  Component Value Date   CREATININE 0.63 12/24/2022   BUN <5 (L) 12/24/2022   NA 136 12/24/2022   K 3.2 (L) 12/24/2022   CL 104 12/24/2022   CO2 25 12/24/2022    Lab Results  Component Value Date   ALT 27 12/23/2022   AST 25 12/23/2022   ALKPHOS 62 12/23/2022   BILITOT 0.6 12/23/2022     Microbiology: Recent Results (from the past 240 hour(s))  Resp panel by RT-PCR (RSV, Flu A&B, Covid) Anterior Nasal Swab     Status: None   Collection Time: 12/14/22  4:11 PM   Specimen: Anterior Nasal Swab  Result Value Ref Range Status   SARS Coronavirus 2 by RT PCR NEGATIVE NEGATIVE Final    Comment: (NOTE) SARS-CoV-2  target nucleic acids are NOT DETECTED.  The SARS-CoV-2 RNA is generally detectable in upper respiratory specimens during the acute phase of infection. The lowest concentration of SARS-CoV-2 viral copies this assay can detect is 138 copies/mL. A negative result does not preclude SARS-Cov-2 infection and should not be used as the sole basis for treatment or other patient management decisions. A negative result may occur with  improper specimen collection/handling, submission of specimen other than nasopharyngeal swab, presence of viral mutation(s) within the areas targeted by this assay, and inadequate number of viral copies(<138 copies/mL). A negative result must be combined with clinical observations, patient history, and epidemiological information. The expected result is Negative.  Fact Sheet for Patients:  EntrepreneurPulse.com.au  Fact Sheet for Healthcare Providers:  IncredibleEmployment.be  This test is no t yet approved or cleared by the [place] FDA and  has been authorized for detection and/or diagnosis of SARS-CoV-2 by FDA under an Emergency Use Authorization (EUA). This EUA will remain  in effect (meaning this test can be used) for the duration of the COVID-19 declaration under Section 564(b)(1) of the Act, 21 U.S.C.section 360bbb-3(b)(1), unless the authorization is terminated  or revoked sooner.       Influenza A by PCR NEGATIVE NEGATIVE Final   Influenza B by PCR NEGATIVE NEGATIVE Final    Comment: (NOTE) The Xpert Xpress SARS-CoV-2/FLU/RSV plus assay is intended as an aid in the diagnosis of influenza from Nasopharyngeal swab specimens and should not be used as a sole basis for treatment. Nasal washings and aspirates are unacceptable for Xpert Xpress SARS-CoV-2/FLU/RSV testing.  Fact Sheet for Patients: EntrepreneurPulse.com.au  Fact Sheet for Healthcare  Providers: IncredibleEmployment.be  This test is not yet approved or cleared by the [place] FDA and has been authorized for detection and/or  diagnosis of SARS-CoV-2 by FDA under an Emergency Use Authorization (EUA). This EUA will remain in effect (meaning this test can be used) for the duration of the COVID-19 declaration under Section 564(b)(1) of the Act, 21 U.S.C. section 360bbb-3(b)(1), unless the authorization is terminated or revoked.     Resp Syncytial Virus by PCR NEGATIVE NEGATIVE Final    Comment: (NOTE) Fact Sheet for Patients: EntrepreneurPulse.com.au  Fact Sheet for Healthcare Providers: IncredibleEmployment.be  This test is not yet approved or cleared by the [place] FDA and has been authorized for detection and/or diagnosis of SARS-CoV-2 by FDA under an Emergency Use Authorization (EUA). This EUA will remain in effect (meaning this test can be used) for the duration of the COVID-19 declaration under Section 564(b)(1) of the Act, 21 U.S.C. section 360bbb-3(b)(1), unless the authorization is terminated or revoked.  Performed at [facility], [place] [address], [place], [place] [address]   MRSA Next Gen by PCR, Nasal     Status: None   Collection Time: 12/16/22  3:13 PM   Specimen: Nasal Mucosa; Nasal Swab  Result Value Ref Range Status   MRSA by PCR Next Gen NOT DETECTED NOT DETECTED Final    Comment: (NOTE) The GeneXpert MRSA Assay (FDA approved for NASAL specimens only), is one component of a comprehensive MRSA colonization surveillance program. It is not intended to diagnose MRSA infection nor to guide or monitor treatment for MRSA infections. Test performance is not FDA approved in patients less than 61 years old. Performed at [facility], [place] [address] [place], [place] [address]     Alcide Evener, [place] for [facility] [place] [facility] [phone] pager  12/24/2022, 2:37 PM

## 2022-12-24 NOTE — Progress Notes (Addendum)
eLink Physician-Brief Progress Note Patient Name: Gary [name] DOB: 14-Jan-1986 MRN: [id number]   Date of Service  12/24/2022  HPI/Events of Note  Patient with nine beat run of wide complex tachycardia with spontaneous termination, he is nor in NSR at 75 beats per minute, Qtc is 0.42, Mg++ and K+ were low and replaced overnight but no repeats yet, AM labs are pending.  eICU Interventions  Will check BMP and Mg++ now. PRN Ativan ordered for DT symptoms.        [name] 12/24/2022, 11:12 PM

## 2022-12-25 ENCOUNTER — Other Ambulatory Visit (HOSPITAL_COMMUNITY): Payer: Self-pay

## 2022-12-25 DIAGNOSIS — E43 Unspecified severe protein-calorie malnutrition: Secondary | ICD-10-CM | POA: Insufficient documentation

## 2022-12-25 DIAGNOSIS — F1010 Alcohol abuse, uncomplicated: Secondary | ICD-10-CM | POA: Diagnosis not present

## 2022-12-25 LAB — BASIC METABOLIC PANEL
Anion gap: 8 (ref 5–15)
Anion gap: 8 (ref 5–15)
BUN: <5 mg/dL — ABNORMAL LOW (ref 6–20)
BUN: <5 mg/dL — ABNORMAL LOW (ref 6–20)
CO2: 25 mmol/L (ref 22–32)
CO2: 24 mmol/L (ref 22–32)
Calcium: 8.9 mg/dL (ref 8.9–10.3)
Calcium: 8.9 mg/dL (ref 8.9–10.3)
Chloride: 102 mmol/L (ref 98–111)
Chloride: 104 mmol/L (ref 98–111)
Creatinine, Ser: 0.83 mg/dL (ref 0.61–1.24)
Creatinine, Ser: 0.79 mg/dL (ref 0.61–1.24)
GFR, Estimated: >60 mL/min (ref >60)
GFR, Estimated: >60 mL/min (ref >60)
Glucose, Bld: 102 mg/dL — ABNORMAL HIGH (ref 70–99)
Glucose, Bld: 96 mg/dL (ref 70–99)
Potassium: 3.8 mmol/L (ref 3.5–5.1)
Potassium: 3.7 mmol/L (ref 3.5–5.1)
Sodium: 135 mmol/L (ref 135–145)
Sodium: 136 mmol/L (ref 135–145)

## 2022-12-25 LAB — HIV-1 RNA QUANT-NO REFLEX-BLD
HIV 1 RNA Quant: 30 copies/mL
LOG10 HIV-1 RNA: 1.477 log10copy/mL

## 2022-12-25 LAB — MAGNESIUM
Magnesium: 2.2 mg/dL (ref 1.7–2.4)
Magnesium: 2 mg/dL (ref 1.7–2.4)

## 2022-12-25 MED ORDER — POTASSIUM CHLORIDE CRYS ER 20 MEQ PO TBCR
40.0000 meq | EXTENDED_RELEASE_TABLET | Freq: Once | ORAL | Status: AC
  Administered 2022-12-25: 40 meq via ORAL
  Filled 2022-12-25: qty 2

## 2022-12-25 MED ORDER — CHLORDIAZEPOXIDE HCL 5 MG PO CAPS
5.0000 mg | ORAL_CAPSULE | Freq: Three times a day (TID) | ORAL | Status: DC
  Administered 2022-12-25 – 2022-12-26 (×2): 5 mg via ORAL
  Filled 2022-12-25 (×2): qty 1

## 2022-12-25 MED ORDER — CHLORDIAZEPOXIDE HCL 25 MG PO CAPS
25.0000 mg | ORAL_CAPSULE | Freq: Once | ORAL | Status: AC
  Administered 2022-12-25: 25 mg via ORAL
  Filled 2022-12-25: qty 1

## 2022-12-25 MED ORDER — ADULT MULTIVITAMIN W/MINERALS CH
1.0000 | ORAL_TABLET | Freq: Every day | ORAL | Status: DC
  Administered 2022-12-25 – 2022-12-27 (×3): 1 via ORAL
  Filled 2022-12-25 (×3): qty 1

## 2022-12-25 MED ORDER — LOPERAMIDE HCL 2 MG PO CAPS: 2.0000 mg | ORAL_CAPSULE | ORAL | Status: DC | PRN

## 2022-12-25 MED ORDER — ENSURE ENLIVE PO LIQD
237.0000 mL | Freq: Two times a day (BID) | ORAL | Status: DC
  Administered 2022-12-25 – 2022-12-27 (×4): 237 mL via ORAL

## 2022-12-25 MED ORDER — BICTEGRAVIR-EMTRICITAB-TENOFOV 50-200-25 MG PO TABS
1.0000 | ORAL_TABLET | Freq: Every day | ORAL | 0 refills | Status: DC
  Filled 2022-12-25: qty 30, 30d supply, fill #0

## 2022-12-25 MED ORDER — CLONIDINE HCL 0.1 MG PO TABS: 0.1000 mg | ORAL_TABLET | ORAL | Status: DC

## 2022-12-25 MED ORDER — ONDANSETRON 4 MG PO TBDP: 4.0000 mg | ORAL_TABLET | Freq: Four times a day (QID) | ORAL | Status: DC | PRN

## 2022-12-25 MED ORDER — METHOCARBAMOL 500 MG PO TABS: 500.0000 mg | ORAL_TABLET | Freq: Three times a day (TID) | ORAL | Status: DC | PRN

## 2022-12-25 MED ORDER — CLONIDINE HCL 0.1 MG PO TABS: 0.1000 mg | ORAL_TABLET | Freq: Every day | ORAL | Status: DC

## 2022-12-25 MED ORDER — CLONIDINE HCL 0.1 MG PO TABS
0.1000 mg | ORAL_TABLET | Freq: Four times a day (QID) | ORAL | Status: DC
  Administered 2022-12-25 – 2022-12-26 (×4): 0.1 mg via ORAL
  Filled 2022-12-25 (×6): qty 1

## 2022-12-25 MED ORDER — HYDROXYZINE HCL 25 MG PO TABS
25.0000 mg | ORAL_TABLET | Freq: Four times a day (QID) | ORAL | Status: DC | PRN
  Administered 2022-12-26 (×2): 25 mg via ORAL
  Filled 2022-12-25 (×2): qty 1

## 2022-12-25 MED ORDER — NAPROXEN 250 MG PO TABS: 500.0000 mg | ORAL_TABLET | Freq: Two times a day (BID) | ORAL | Status: DC | PRN

## 2022-12-25 NOTE — Progress Notes (Addendum)
PROGRESS NOTE   Gary [name]  [id number] DOB: 07-09-1986 DOA: 12/22/2022 PCP: Patient, No Pcp Per  Brief Narrative:  37 year old white male known HIV noncompliant on meds (Biktarvy)-he is homeless Bipolar with schizophrenia Underlying EtOH drinking 1/2 gallon of vodka daily, EtOH withdrawal seizures Recent admission by critical care medicine 2/5 through 12/22/2021 with abdominal pain, severe DTs placed on Precedex Librium clonidine-->left AMA from that hospital stay  Readmitted by critical care medicine 12/23/2022--he had left the ED AMA on 2/13 after consuming an unclear amount of EtOH 24 ounces and felt abdominal pain and came back to the hospital - Found to have AKI creatinine 0.7-->1.9 lipase 113 troponin mildly elevated 61 ethanol level 52 CT head atrophic changes Stable 10 mm pancreatic pseudocyst with Hyper-Tet steatosis and moderate prostatomegaly  Started on Precedex in ICU 2/16 transferred to hospitalist service  Hospital-Problem based course  Alcohol withdrawal, ethanolism - Continue CIWA protocol, start clonidine detox protocol - Give 25 mg of Librium now [ciwa 7-10] in addition to the Ativan detox protocol already ordered--he typically has somewhat severe withdrawals -Resume Keppra for withdrawal seizures although unlikely he was taking  Mild pancreatitis on admission, no pseudocyst - Resolved patient eating  Bipolar with schizophrenia, PTSD -Not taking any meds? - Will need to review notes and see what he had been on previously -Psychiatry saw the patient and did not really recommend any other meds for his underlying issues  AKI on admission Hypokalemia, hypomagnesemia -Saline lock IV today as this is resolved -Hypokalemia is resolved, hypomagnesemia is resolved  Troponin elevation without chest pain - Probably related to pancreatitis physiology versus not - As no chest pain no further workup  HIV with noncompliance no AIDS - No thrush some diarrhea  however, characterizes diarrhea as "soup like" - Resuming Biktarvy-appreciate ID assistance -Patient is leukopenic probably because of his HIV  Severe protein energy malnutrition present on admission secondary to HIV noncompliance poor food security as homeless - Monitor  DVT prophylaxis: Heparin Code Status: Full Family Communication: None Disposition:  Status is: Inpatient Remains inpatient appropriate because:   Needs to completely withdraw and can go to [facility] for his homelessness TOC consulted to help with application for Medicaid   Subjective: Awake, feels like he is withdrawing a little bit-mainly tremulous No chest pain No fever no dark stool although is having some diarrhea No cough no cold   Objective: Vitals:   12/24/22 1705 12/24/22 2133 12/25/22 0205 12/25/22 0500  BP: 94/63 112/82 109/62   Pulse: 91 77 73   Resp:  20 20   Temp:  98.5 F (36.9 C) 98.4 F (36.9 C)   TempSrc:  Oral Oral   SpO2:  98% 94%   Weight:    66 kg  Height:        Intake/Output Summary (Last 24 hours) at 12/25/2022 1119 Last data filed at 12/25/2022 0900 Gross per 24 hour  Intake 1442.79 ml  Output 1060 ml  Net 382.79 ml   Filed Weights   12/24/22 0500 12/24/22 1443 12/25/22 0500  Weight: 66.4 kg 66.1 kg 66 kg    Examination:  EOMI NCAT quite emaciated nonicteric Chest is clear no added sound no rales no rhonchi Abdomen is soft with no rebound I did not examine sacrum ROM is intact  Data Reviewed: personally reviewed   CBC    Component Value Date/Time   WBC 2.9 (L) 12/24/2022 0255   RBC 3.92 (L) 12/24/2022 0255   HGB 12.8 (L) 12/24/2022 0255  HGB 11.1 (L) 06/15/2018 1902   HCT 39.0 12/24/2022 0255   HCT 33.0 (L) 06/15/2018 1902   PLT 289 12/24/2022 0255   PLT 235 06/15/2018 1902   MCV 99.5 12/24/2022 0255   MCV 94 06/15/2018 1902   MCV 100 11/15/2014 1810   MCH 32.7 12/24/2022 0255   MCHC 32.8 12/24/2022 0255   RDW 13.3 12/24/2022 0255   RDW 13.8 06/15/2018  1902   RDW 13.8 11/15/2014 1810   LYMPHSABS 0.6 (L) 12/14/2022 1300   LYMPHSABS 1.0 06/15/2018 1902   MONOABS 0.2 12/14/2022 1300   EOSABS 0.0 12/14/2022 1300   EOSABS 0.0 06/15/2018 1902   BASOSABS 0.0 12/14/2022 1300   BASOSABS 0.0 06/15/2018 1902      Latest Ref Rng & Units 12/25/2022    4:22 AM 12/24/2022   11:35 PM 12/24/2022    2:55 AM  CMP  Glucose 70 - 99 mg/dL 96  102  127   BUN 6 - 20 mg/dL <5  <5  <5   Creatinine 0.61 - 1.24 mg/dL 0.79  0.83  0.63   Sodium 135 - 145 mmol/L 136  135  136   Potassium 3.5 - 5.1 mmol/L 3.7  3.8  3.2   Chloride 98 - 111 mmol/L 104  102  104   CO2 22 - 32 mmol/L 24  25  25   $ Calcium 8.9 - 10.3 mg/dL 8.9  8.9  8.6      Radiology Studies: No results found.   Scheduled Meds:  bictegravir-emtricitabine-tenofovir AF  1 tablet Oral Daily   folic acid  1 mg Oral Daily   heparin  5,000 Units Subcutaneous Q8H   levETIRAcetam  500 mg Oral BID   LORazepam  0-4 mg Intravenous Q12H   Or   LORazepam  0-4 mg Oral Q12H   thiamine  100 mg Oral Daily   Continuous Infusions:  lactated ringers 75 mL/hr at 12/25/22 0605     LOS: 2 days   Time spent: [place], MD [facility] To contact the attending provider between 7A-7P or the covering provider during after hours 7P-7A, please log into the web site [email or web address] and access using universal [place] password for that web site. If you do not have the password, please call the hospital operator.  12/25/2022, 11:19 AM

## 2022-12-25 NOTE — Progress Notes (Signed)
Initial Nutrition Assessment  DOCUMENTATION CODES:   Severe malnutrition in context of chronic illness  INTERVENTION:  - Regular diet.  - Ensure Plus High Protein po BID, each supplement provides 350 kcal and 20 grams of protein. - Encourage intake at all meals.  - Add daily multivitamin in addition to thiamine and folic acid due to history of etoh abuse. - Monitor weight trends.   NUTRITION DIAGNOSIS:   Severe Malnutrition related to chronic illness (EtOH abuse, alcohol induced pancreatitis) as evidenced by severe fat depletion, severe muscle depletion.  GOAL:   Patient will meet greater than or equal to 90% of their needs  MONITOR:   PO intake, Supplement acceptance, Weight trends  REASON FOR ASSESSMENT:   Malnutrition Screening Tool    ASSESSMENT:   37 year old man who presented 2/14 for fatigue, chest/abdominal pain and weakness in the setting of EtOH intoxication. PMHx significant for anxiety, bipolar 2 disorder, schizophrenia, PTSD, HIV, SDH, EtOH abuse c/b alcohol-induced pancreatitis and seizures in the setting of EtOH withdrawal (drinks ~1L liquor/day). Recently admitted to [facility] 2/5 - 2/13 for abdominal pain/vomiting in the setting of EtOH withdrawal; left AMA.  Patient reports he typically fluctuates up and down between 145-150#. Notes he may have weight loss recently. Per EMR, weight without significant changes In weight past year. Possible 5# or 3.3% weight loss in the past month however this is not significant for the time frame.  Patient reports he only eats 1 meal a day at home. Notes he eat whatever he wants but avoids bread as it "soaks up the alcohol".  Patient states he drinks 1/2 a gallon of alcohol daily and can't really eat more or it would cause him to go into withdrawal.   Patient's breakfast tray in room and noted to be 100% completed. He is documented to be eating 100% of meals since admission. Patient agreeable to receive Ensure as he has liked them  in the past.  Medications reviewed and include: Folic acid, thiamine  Labs reviewed:  -   NUTRITION - FOCUSED PHYSICAL EXAM:  Flowsheet Row Most Recent Value  Orbital Region Severe depletion  Upper Arm Region Severe depletion  Thoracic and Lumbar Region Severe depletion  Buccal Region Severe depletion  Temple Region Severe depletion  Clavicle Bone Region Severe depletion  Clavicle and Acromion Bone Region Severe depletion  Scapular Bone Region Unable to assess  Dorsal Hand Moderate depletion  Patellar Region Severe depletion  Anterior Thigh Region Severe depletion  Posterior Calf Region Severe depletion  Edema (RD Assessment) None  Hair Reviewed  Eyes Reviewed  Mouth Reviewed  Skin Reviewed  Nails Reviewed       Diet Order:   Diet Order             Diet regular Room service appropriate? Yes; Fluid consistency: Thin  Diet effective now                   EDUCATION NEEDS:  Education needs have been addressed  Skin:  Skin Assessment: Reviewed RN Assessment  Last BM:  PTA  Height:  Ht Readings from Last 1 Encounters:  12/24/22 6' (1.829 m)   Weight:  Wt Readings from Last 1 Encounters:  12/25/22 66 kg    BMI:  Body mass index is 19.72 kg/m.  Estimated Nutritional Needs:  Kcal:  2000-2300 kcals Protein:  100-120 grams Fluid:  >/= 2L    Samson Frederic RD, LDN For contact information, refer to [facility].

## 2022-12-25 NOTE — Progress Notes (Addendum)
Subjective: No new complaints   Antibiotics:  Anti-infectives (From admission, onward)    Start     Dose/Rate Route Frequency Ordered Stop   12/25/22 0000  bictegravir-emtricitabine-tenofovir AF (BIKTARVY) 50-200-25 MG TABS tablet       Note to Pharmacy: Please deliver to patient room 1444 at [facility]   1 tablet Oral Daily 12/25/22 0926 01/24/23 2359   12/23/22 1000  bictegravir-emtricitabine-tenofovir AF (BIKTARVY) 50-200-25 MG per tablet 1 tablet        1 tablet Oral Daily 12/23/22 0453         Medications: Scheduled Meds:  bictegravir-emtricitabine-tenofovir AF  1 tablet Oral Daily   folic acid  1 mg Oral Daily   heparin  5,000 Units Subcutaneous Q8H   levETIRAcetam  500 mg Oral BID   LORazepam  0-4 mg Intravenous Q12H   Or   LORazepam  0-4 mg Oral Q12H   thiamine  100 mg Oral Daily   Continuous Infusions:  lactated ringers 75 mL/hr at 12/25/22 0605   PRN Meds:.acetaminophen, docusate sodium, LORazepam, mouth rinse, polyethylene glycol    Objective: Weight change: -0.266 kg  Intake/Output Summary (Last 24 hours) at 12/25/2022 [id number] Last data filed at 12/25/2022 0900 Gross per 24 hour  Intake 1322.79 ml  Output 1660 ml  Net -337.21 ml   Blood pressure 109/62, pulse 73, temperature 98.4 F (36.9 C), temperature source Oral, resp. rate 20, height 6' (1.829 m), weight 66 kg, SpO2 94 %. Temp:  [98.1 F (36.7 C)-98.6 F (37 C)] 98.4 F (36.9 C) (02/16 0205) Pulse Rate:  [73-135] 73 (02/16 0205) Resp:  [15-20] 20 (02/16 0205) BP: (94-117)/(62-90) 109/62 (02/16 0205) SpO2:  [90 %-100 %] 94 % (02/16 0205) Weight:  [66 kg-66.1 kg] 66 kg (02/16 0500)  Physical Exam: Physical Exam Constitutional:      Appearance: He is well-developed. He is cachectic.  HENT:     Head: Normocephalic and atraumatic.  Eyes:     Conjunctiva/sclera: Conjunctivae normal.  Cardiovascular:     Rate and Rhythm: Normal rate and regular rhythm.  Pulmonary:     Effort: Pulmonary  effort is normal. No respiratory distress.     Breath sounds: No wheezing.  Abdominal:     General: There is no distension.     Palpations: Abdomen is soft.  Musculoskeletal:        General: Normal range of motion.     Cervical back: Normal range of motion and neck supple.  Skin:    General: Skin is warm and dry.     Findings: No erythema or rash.  Neurological:     General: No focal deficit present.     Mental Status: He is alert and oriented to person, place, and time.  Psychiatric:        Mood and Affect: Mood normal.        Behavior: Behavior normal.        Thought Content: Thought content normal.        Judgment: Judgment normal.      CBC:    BMET Recent Labs    12/24/22 2335 12/25/22 0422  NA 135 136  K 3.8 3.7  CL 102 104  CO2 25 24  GLUCOSE 102* 96  BUN <5* <5*  CREATININE 0.83 0.79  CALCIUM 8.9 8.9     Liver Panel  Recent Labs    12/22/22 2024 12/23/22 1501  PROT 6.6 6.0*  ALBUMIN 3.3* 2.9*  AST 31  25  ALT 35 27  ALKPHOS 59 62  BILITOT 0.5 0.6       Sedimentation Rate No results for input(s): "ESRSEDRATE" in the last 72 hours. C-Reactive Protein No results for input(s): "CRP" in the last 72 hours.  Micro Results: Recent Results (from the past 720 hour(s))  Resp panel by RT-PCR (RSV, Flu A&B, Covid) Anterior Nasal Swab     Status: None   Collection Time: 12/14/22  4:11 PM   Specimen: Anterior Nasal Swab  Result Value Ref Range Status   SARS Coronavirus 2 by RT PCR NEGATIVE NEGATIVE Final    Comment: (NOTE) SARS-CoV-2 target nucleic acids are NOT DETECTED.  The SARS-CoV-2 RNA is generally detectable in upper respiratory specimens during the acute phase of infection. The lowest concentration of SARS-CoV-2 viral copies this assay can detect is 138 copies/mL. A negative result does not preclude SARS-Cov-2 infection and should not be used as the sole basis for treatment or other patient management decisions. A negative result may occur  with  improper specimen collection/handling, submission of specimen other than nasopharyngeal swab, presence of viral mutation(s) within the areas targeted by this assay, and inadequate number of viral copies(<138 copies/mL). A negative result must be combined with clinical observations, patient history, and epidemiological information. The expected result is Negative.  Fact Sheet for Patients:  EntrepreneurPulse.com.au  Fact Sheet for Healthcare Providers:  IncredibleEmployment.be  This test is no t yet approved or cleared by the [place] FDA and  has been authorized for detection and/or diagnosis of SARS-CoV-2 by FDA under an Emergency Use Authorization (EUA). This EUA will remain  in effect (meaning this test can be used) for the duration of the COVID-19 declaration under Section 564(b)(1) of the Act, 21 U.S.C.section 360bbb-3(b)(1), unless the authorization is terminated  or revoked sooner.       Influenza A by PCR NEGATIVE NEGATIVE Final   Influenza B by PCR NEGATIVE NEGATIVE Final    Comment: (NOTE) The Xpert Xpress SARS-CoV-2/FLU/RSV plus assay is intended as an aid in the diagnosis of influenza from Nasopharyngeal swab specimens and should not be used as a sole basis for treatment. Nasal washings and aspirates are unacceptable for Xpert Xpress SARS-CoV-2/FLU/RSV testing.  Fact Sheet for Patients: EntrepreneurPulse.com.au  Fact Sheet for Healthcare Providers: IncredibleEmployment.be  This test is not yet approved or cleared by the [place] FDA and has been authorized for detection and/or diagnosis of SARS-CoV-2 by FDA under an Emergency Use Authorization (EUA). This EUA will remain in effect (meaning this test can be used) for the duration of the COVID-19 declaration under Section 564(b)(1) of the Act, 21 U.S.C. section 360bbb-3(b)(1), unless the authorization is terminated  or revoked.     Resp Syncytial Virus by PCR NEGATIVE NEGATIVE Final    Comment: (NOTE) Fact Sheet for Patients: EntrepreneurPulse.com.au  Fact Sheet for Healthcare Providers: IncredibleEmployment.be  This test is not yet approved or cleared by the [place] FDA and has been authorized for detection and/or diagnosis of SARS-CoV-2 by FDA under an Emergency Use Authorization (EUA). This EUA will remain in effect (meaning this test can be used) for the duration of the COVID-19 declaration under Section 564(b)(1) of the Act, 21 U.S.C. section 360bbb-3(b)(1), unless the authorization is terminated or revoked.  Performed at [facility], [place] [address], [place], [place] [address]   MRSA Next Gen by PCR, Nasal     Status: None   Collection Time: 12/16/22  3:13 PM   Specimen: Nasal Mucosa;  Nasal Swab  Result Value Ref Range Status   MRSA by PCR Next Gen NOT DETECTED NOT DETECTED Final    Comment: (NOTE) The GeneXpert MRSA Assay (FDA approved for NASAL specimens only), is one component of a comprehensive MRSA colonization surveillance program. It is not intended to diagnose MRSA infection nor to guide or monitor treatment for MRSA infections. Test performance is not FDA approved in patients less than 7 years old. Performed at [facility], [place] [address] [place], [place] [address]     Studies/Results: No results found.    Assessment/Plan:  INTERVAL HISTORY: he had run of VT over night sp mag and k repletion   Active Problems:   Alcoholic intoxication with complication ([place])   Alcohol withdrawal ([place])   Hypomagnesemia    Gary [name] is a 37 y.o. male with HIV disease, homelessness, heavy etoh abuse.  #1 Symptomatic HIV disease but not AIDS. Haphazard adherence to therapy  VL on this admission is 31 which is an improvement  We will provide him with 30 days of Biktarvy at DC (he also  says he has some in his backpack near [place] has an appointment on 01/07/2023 at [facility] with Dr. West Bali at   [facility], which  is located in the [facility] at  [address] in [place].  [address], which is located to the left of the elevators.  Phone: [phone]  Fax: [phone]  [email or web address]  The patient should arrive 30 minutes prior to their appoitment.  He MIGHT be candidate for [place] study but his frequent hospitalizations might be prohibitive for this  #2 Etohism: this is his biggest obstacle to health  #3 Homelessness: housing would be helpful  He should be able to get Medicaid and I put in consult to try to have this arranged in the hospital  I have discussed with [name] and he had me give his cell # to the patient with request patient call Karn Pickler today to discuss his care. Karn Pickler will try to help him with transportation, ride to appt and "bridging" to care  I spent 52 minutes with the patient including than 50% of the time in face to face counseling of the patient to be diseases alcoholism the [place] study  along with review of medical records in preparation for the visit and during the visit and in coordination of his care.   I will sign off for now.  Please call with further questions.   LOS: 2 days   Alcide Evener 12/25/2022, 9:28 AM

## 2022-12-25 NOTE — Progress Notes (Signed)
[place] Progress Note Patient Name: RANSOM MCQUAIDE DOB: 1986/06/28 MRN: [id number]   Date of Service  12/25/2022  HPI/Events of Note  K+ 3.8, Mg++ 2.2.  eICU Interventions  KCL 40 meq po x 1 ordered to  try to bring K+ above 4.0        Leotha Westermeyer U Maisen Klingler 12/25/2022, 1:58 AM

## 2022-12-25 NOTE — TOC Progression Note (Signed)
Transition of Care [facility]) - Progression Note    Patient Details  Name: Gary [name] MRN: [id number] Date of Birth: 09-01-1986  Transition of Care [facility]) CM/SW [place], LCSW Phone Number: 12/25/2022, 9:16 AM  Clinical Narrative:    CSW spoke with pt regarding substance abuse resource. Pt has declined resources. [facility] discussing primary care options, pt stated MD was in the room. CSW will call back to discuss insurance and PCP options. TOC to follow.    Expected Discharge Plan: Homeless Shelter Barriers to Discharge: Continued Medical Work up  Expected Discharge Plan and Services In-house Referral: Clinical Social Work     Living arrangements for the past 2 months: Homeless                                       Social Determinants of Health ([place]) Interventions [place]: Food Insecurity Present (12/24/2022)  Housing: High Risk (12/24/2022)  Transportation Needs: Unmet Transportation Needs (12/24/2022)  Utilities: Not At Risk (12/24/2022)  Alcohol Screen: High Risk (07/25/2021)  Depression (PHQ2-9): Medium Risk (02/12/2021)  Tobacco Use: High Risk (12/24/2022)    Readmission Risk Interventions    10/23/2022   11:31 AM 10/23/2022    9:05 AM 06/01/2022    1:14 PM  Readmission Risk Prevention Plan  Transportation Screening Complete Complete Complete  Medication Review ([place]) Complete Complete Referral to Pharmacy  PCP or Specialist appointment within 3-5 days of discharge  Complete Complete  HRI or [place]  Complete Complete  SW Recovery Care/Counseling Consult  Complete Complete  Palliative Care Screening  Not Applicable Not [place]  Not Applicable Not Applicable

## 2022-12-26 DIAGNOSIS — F1010 Alcohol abuse, uncomplicated: Secondary | ICD-10-CM | POA: Diagnosis not present

## 2022-12-26 LAB — COMPREHENSIVE METABOLIC PANEL
ALT: 29 U/L (ref 0–44)
AST: 25 U/L (ref 15–41)
Albumin: 3.3 g/dL — ABNORMAL LOW (ref 3.5–5.0)
Alkaline Phosphatase: 53 U/L (ref 38–126)
Anion gap: 8 (ref 5–15)
BUN: 10 mg/dL (ref 6–20)
CO2: 24 mmol/L (ref 22–32)
Calcium: 9.2 mg/dL (ref 8.9–10.3)
Chloride: 105 mmol/L (ref 98–111)
Creatinine, Ser: 0.87 mg/dL (ref 0.61–1.24)
GFR, Estimated: >60 mL/min (ref >60)
Glucose, Bld: 97 mg/dL (ref 70–99)
Potassium: 3.8 mmol/L (ref 3.5–5.1)
Sodium: 137 mmol/L (ref 135–145)
Total Bilirubin: 0.4 mg/dL (ref 0.3–1.2)
Total Protein: 6.4 g/dL — ABNORMAL LOW (ref 6.5–8.1)

## 2022-12-26 LAB — CBC
HCT: 39.7 % (ref 39.0–52.0)
Hemoglobin: 12.9 g/dL — ABNORMAL LOW (ref 13.0–17.0)
MCH: 32.8 pg (ref 26.0–34.0)
MCHC: 32.5 g/dL (ref 30.0–36.0)
MCV: 101 fL — ABNORMAL HIGH (ref 80.0–100.0)
Platelets: 331 10*3/uL (ref 150–400)
RBC: 3.93 MIL/uL — ABNORMAL LOW (ref 4.22–5.81)
RDW: 13.3 % (ref 11.5–15.5)
WBC: 3.2 10*3/uL — ABNORMAL LOW (ref 4.0–10.5)
nRBC: 0 % (ref 0.0–0.2)

## 2022-12-26 MED ORDER — CHLORDIAZEPOXIDE HCL 25 MG PO CAPS: 25.0000 mg | ORAL_CAPSULE | ORAL | Status: DC

## 2022-12-26 MED ORDER — CHLORDIAZEPOXIDE HCL 25 MG PO CAPS
25.0000 mg | ORAL_CAPSULE | Freq: Three times a day (TID) | ORAL | Status: DC
  Administered 2022-12-27: 25 mg via ORAL
  Filled 2022-12-26: qty 1

## 2022-12-26 MED ORDER — THIAMINE MONONITRATE 100 MG PO TABS
100.0000 mg | ORAL_TABLET | Freq: Every day | ORAL | Status: DC
  Administered 2022-12-27: 100 mg via ORAL
  Filled 2022-12-26: qty 1

## 2022-12-26 MED ORDER — ONDANSETRON 4 MG PO TBDP: 4.0000 mg | ORAL_TABLET | Freq: Four times a day (QID) | ORAL | Status: DC | PRN

## 2022-12-26 MED ORDER — ADULT MULTIVITAMIN W/MINERALS CH: 1.0000 | ORAL_TABLET | Freq: Every day | ORAL | Status: DC

## 2022-12-26 MED ORDER — HYDROXYZINE HCL 25 MG PO TABS: 25.0000 mg | ORAL_TABLET | Freq: Four times a day (QID) | ORAL | Status: DC | PRN

## 2022-12-26 MED ORDER — LOPERAMIDE HCL 2 MG PO CAPS: 2.0000 mg | ORAL_CAPSULE | ORAL | Status: DC | PRN

## 2022-12-26 MED ORDER — CHLORDIAZEPOXIDE HCL 25 MG PO CAPS
50.0000 mg | ORAL_CAPSULE | Freq: Once | ORAL | Status: AC
  Administered 2022-12-26: 50 mg via ORAL
  Filled 2022-12-26: qty 2

## 2022-12-26 MED ORDER — CHLORDIAZEPOXIDE HCL 25 MG PO CAPS
25.0000 mg | ORAL_CAPSULE | Freq: Four times a day (QID) | ORAL | Status: AC
  Administered 2022-12-26 (×2): 25 mg via ORAL
  Filled 2022-12-26 (×2): qty 1

## 2022-12-26 MED ORDER — CHLORDIAZEPOXIDE HCL 25 MG PO CAPS: 25.0000 mg | ORAL_CAPSULE | Freq: Four times a day (QID) | ORAL | Status: DC | PRN

## 2022-12-26 MED ORDER — THIAMINE HCL 100 MG/ML IJ SOLN: 100.0000 mg | Freq: Once | INTRAMUSCULAR | Status: DC

## 2022-12-26 MED ORDER — CHLORDIAZEPOXIDE HCL 25 MG PO CAPS: 25.0000 mg | ORAL_CAPSULE | Freq: Every day | ORAL | Status: DC

## 2022-12-26 NOTE — Progress Notes (Signed)
PROGRESS NOTE   Gary [name]  [id number] DOB: November 07, 1986 DOA: 12/22/2022 PCP: Patient, No Pcp Per  Brief Narrative:  37 year old white male known HIV noncompliant on meds (Biktarvy)-he is homeless Bipolar with schizophrenia Underlying EtOH drinking 1/2 gallon of vodka daily, EtOH withdrawal seizures Recent admission by critical care medicine 2/5 through 12/22/2021 with abdominal pain, severe DTs placed on Precedex Librium clonidine-->left AMA from that hospital stay  Readmitted by critical care medicine 12/23/2022--he had left the ED AMA on 2/13 after consuming an unclear amount of EtOH 24 ounces and felt abdominal pain and came back to the hospital - Found to have AKI creatinine 0.7-->1.9 lipase 113 troponin mildly elevated 61 ethanol level 52 CT head atrophic changes Stable 10 mm pancreatic pseudocyst with Hyper-Tet steatosis and moderate prostatomegaly  Started on Precedex in ICU 2/16 transferred to hospitalist service  Hospital-Problem based course  Alcohol withdrawal, ethanolism - Changed to Librium CIWA continue clonidine detox for CIWA scores as high as 14 today -Resume Keppra for withdrawal seizures although unlikely he was taking  Mild pancreatitis on admission, no pseudocyst - Resolved patient eating  Bipolar with schizophrenia, PTSD -Not taking any meds? - Will need to review notes and see what he had been on previously -Psychiatry input on 2/15 appreciated -I did tell him I think he can quit drinking and his other things but he needs community, support, AA program and have encouraged him to do all of these things  AKI on admission Hypokalemia, hypomagnesemia -Saline lock IV today as this is resolved -Hypokalemia is resolved  Troponin elevation without chest pain - Probably related to pancreatitis physiology versus not - As no chest pain no further workup  HIV with noncompliance no AIDS - No thrush some diarrhea but seen to have resolved - Resuming  Biktarvy-appreciate ID assistance -Patient is leukopenic probably because of his HIV  Severe protein energy malnutrition present on admission secondary to HIV noncompliance poor food security as homeless - Monitor  DVT prophylaxis: Heparin Code Status: Full Family Communication: None Disposition:  Status is: Inpatient Remains inpatient appropriate because:   Needs to completely the process of alcohol withdrawal Probably in the next several days can discharge to [facility]   Subjective:  No distress no pain no fever    Objective: Vitals:   12/26/22 0500 12/26/22 0622 12/26/22 1013 12/26/22 1206  BP:  97/63 98/69 (!) 84/54  Pulse:  (!) 59  61  Resp:  18  18  Temp:  98.1 F (36.7 C)  98.3 F (36.8 C)  TempSrc:  Oral  Oral  SpO2:  100%  94%  Weight: 65.1 kg     Height:        Intake/Output Summary (Last 24 hours) at 12/26/2022 1256 Last data filed at 12/26/2022 1030 Gross per 24 hour  Intake 820 ml  Output 570 ml  Net 250 ml    Filed Weights   12/24/22 1443 12/25/22 0500 12/26/22 0500  Weight: 66.1 kg 66 kg 65.1 kg    Examination:  EOMI NCAT quite emaciated nonicteric Chest is clear no added sound no rales no rhonchi Abdomen is soft with no rebound I did not examine sacrum ROM is intact  Data Reviewed: personally reviewed   CBC    Component Value Date/Time   WBC 3.2 (L) 12/26/2022 0411   RBC 3.93 (L) 12/26/2022 0411   HGB 12.9 (L) 12/26/2022 0411   HGB 11.1 (L) 06/15/2018 1902   HCT 39.7 12/26/2022 0411   HCT 33.0 (L)  06/15/2018 1902   PLT 331 12/26/2022 0411   PLT 235 06/15/2018 1902   MCV 101.0 (H) 12/26/2022 0411   MCV 94 06/15/2018 1902   MCV 100 11/15/2014 1810   MCH 32.8 12/26/2022 0411   MCHC 32.5 12/26/2022 0411   RDW 13.3 12/26/2022 0411   RDW 13.8 06/15/2018 1902   RDW 13.8 11/15/2014 1810   LYMPHSABS 0.6 (L) 12/14/2022 1300   LYMPHSABS 1.0 06/15/2018 1902   MONOABS 0.2 12/14/2022 1300   EOSABS 0.0 12/14/2022 1300   EOSABS 0.0 06/15/2018  1902   BASOSABS 0.0 12/14/2022 1300   BASOSABS 0.0 06/15/2018 1902      Latest Ref Rng & Units 12/26/2022    4:11 AM 12/25/2022    4:22 AM 12/24/2022   11:35 PM  CMP  Glucose 70 - 99 mg/dL 97  96  102   BUN 6 - 20 mg/dL 10  <5  <5   Creatinine 0.61 - 1.24 mg/dL 0.87  0.79  0.83   Sodium 135 - 145 mmol/L 137  136  135   Potassium 3.5 - 5.1 mmol/L 3.8  3.7  3.8   Chloride 98 - 111 mmol/L 105  104  102   CO2 22 - 32 mmol/L 24  24  25   $ Calcium 8.9 - 10.3 mg/dL 9.2  8.9  8.9   Total Protein 6.5 - 8.1 g/dL 6.4     Total Bilirubin 0.3 - 1.2 mg/dL 0.4     Alkaline Phos 38 - 126 U/L 53     AST 15 - 41 U/L 25     ALT 0 - 44 U/L 29        Radiology Studies: No results found.   Scheduled Meds:  bictegravir-emtricitabine-tenofovir AF  1 tablet Oral Daily   chlordiazePOXIDE  25 mg Oral QID   Followed by   Derrill Memo ON 12/27/2022] chlordiazePOXIDE  25 mg Oral TID   Followed by   Derrill Memo ON 12/28/2022] chlordiazePOXIDE  25 mg Oral BH-qamhs   Followed by   Derrill Memo ON 12/29/2022] chlordiazePOXIDE  25 mg Oral Daily   cloNIDine  0.1 mg Oral QID   Followed by   Derrill Memo ON 12/27/2022] cloNIDine  0.1 mg Oral BH-qamhs   Followed by   Derrill Memo ON 12/30/2022] cloNIDine  0.1 mg Oral QAC breakfast   feeding supplement  237 mL Oral BID BM   folic acid  1 mg Oral Daily   levETIRAcetam  500 mg Oral BID   multivitamin with minerals  1 tablet Oral Daily   [START ON 12/27/2022] thiamine  100 mg Oral Daily   Continuous Infusions:     LOS: 3 days   Time spent: 25  Nita Sells, MD [facility] To contact the attending provider between 7A-7P or the covering provider during after hours 7P-7A, please log into the web site [email or web address] and access using universal [place] password for that web site. If you do not have the password, please call the hospital operator.  12/26/2022, 12:56 PM

## 2022-12-27 DIAGNOSIS — F1010 Alcohol abuse, uncomplicated: Secondary | ICD-10-CM | POA: Diagnosis not present

## 2022-12-27 LAB — COMPREHENSIVE METABOLIC PANEL
ALT: 35 U/L (ref 0–44)
AST: 27 U/L (ref 15–41)
Albumin: 3.5 g/dL (ref 3.5–5.0)
Alkaline Phosphatase: 52 U/L (ref 38–126)
Anion gap: 8 (ref 5–15)
BUN: 15 mg/dL (ref 6–20)
CO2: 25 mmol/L (ref 22–32)
Calcium: 9.5 mg/dL (ref 8.9–10.3)
Chloride: 103 mmol/L (ref 98–111)
Creatinine, Ser: 0.61 mg/dL (ref 0.61–1.24)
GFR, Estimated: >60 mL/min (ref >60)
Glucose, Bld: 110 mg/dL — ABNORMAL HIGH (ref 70–99)
Potassium: 3.7 mmol/L (ref 3.5–5.1)
Sodium: 136 mmol/L (ref 135–145)
Total Bilirubin: 0.4 mg/dL (ref 0.3–1.2)
Total Protein: 6.7 g/dL (ref 6.5–8.1)

## 2022-12-27 LAB — CBC
HCT: 40 % (ref 39.0–52.0)
Hemoglobin: 12.8 g/dL — ABNORMAL LOW (ref 13.0–17.0)
MCH: 32.8 pg (ref 26.0–34.0)
MCHC: 32 g/dL (ref 30.0–36.0)
MCV: 102.6 fL — ABNORMAL HIGH (ref 80.0–100.0)
Platelets: 343 10*3/uL (ref 150–400)
RBC: 3.9 MIL/uL — ABNORMAL LOW (ref 4.22–5.81)
RDW: 13.5 % (ref 11.5–15.5)
WBC: 3.9 10*3/uL — ABNORMAL LOW (ref 4.0–10.5)
nRBC: 0 % (ref 0.0–0.2)

## 2022-12-27 MED ORDER — LEVETIRACETAM 500 MG PO TABS
500.0000 mg | ORAL_TABLET | Freq: Two times a day (BID) | ORAL | 11 refills | Status: DC
  Filled 2022-12-27: qty 60, 30d supply, fill #0

## 2022-12-27 NOTE — TOC Progression Note (Signed)
Transition of Care [facility]) - Progression Note    Patient Details  Name: Gary [name] MRN: [id number] Date of Birth: Jan 18, 1986  [facility]) CM/SW Contact  Henrietta Dine, RN Phone Number: 12/27/2022, 2:24 PM  Clinical Narrative:    Damaris Schooner w/ pt in room due to notification for transportation needs; pt says his plan is to return to [employer]; he asked for a bus pass; pt says he does not have insurance or PCP; pt declined resources for shelters, social services, and PCPs; pt says he will talk to is ID MD regarding recc for PCP; bus pass given to [place], Therapist, sports; no TOC.   Expected Discharge Plan: Homeless Shelter Barriers to Discharge: No Barriers Identified  Expected Discharge Plan and Services In-house Referral: Clinical Social Work Discharge Planning Services: CM Consult   Living arrangements for the past 2 months: Homeless Shelter Expected Discharge Date: 12/27/22                                     Social Determinants of Health (SDOH) Interventions SDOH Screenings   Food Insecurity: No Food Insecurity (12/27/2022)  Recent Concern: [place] Present (12/24/2022)  Housing: High Risk (12/27/2022)  Transportation Needs: Unmet Transportation Needs (12/27/2022)  Utilities: Not At Risk (12/27/2022)  Alcohol Screen: High Risk (07/25/2021)  Depression (PHQ2-9): Medium Risk (02/12/2021)  Tobacco Use: High Risk (12/24/2022)    Readmission Risk Interventions    10/23/2022   11:31 AM 10/23/2022    9:05 AM 06/01/2022    1:14 PM  Readmission Risk Prevention Plan  Transportation Screening Complete Complete Complete  Medication Review ([place]) Complete Complete Referral to Pharmacy  PCP or Specialist appointment within 3-5 days of discharge  Complete Complete  HRI or [place]  Complete Complete  SW Recovery Care/Counseling Consult  Complete Complete  Palliative Care Screening  Not Applicable Not [place]  Not Applicable Not Applicable

## 2022-12-27 NOTE — Progress Notes (Signed)
AVS given to patient and explained at the bedside. Medications and follow up appointments have been explained with pt verbalizing understanding.  

## 2022-12-27 NOTE — Discharge Summary (Addendum)
Physician Discharge Summary  Gary [name] [id number] DOB: [birth year] DOA: 12/22/2022  PCP: Patient, No Pcp Per  Admit date: 12/22/2022 Discharge date: 12/27/2022  Time spent: 37 minutes  Recommendations for Outpatient Follow-up:  Patient to continue Biktarvy, given prescription prior to discharge in his hand - Rx called in for [place] and other meds to [place] CBC Chem-12 viral loads in about 1 month as per ID, CC Dr. 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 Severe energy protein malnutrition secondary to HIV and homelessness  Please see below for itemized issues addressed in HOpsital- refer to other progress notes for clarity if needed  Discharge Condition: \Guarded if he continues to drink which it is assumed he will  Diet recommendation: Regular  Filed Weights   12/24/22 1443 12/25/22 0500 12/26/22 0500  Weight: 66.1 kg 66 kg 65.1 kg    History of present illness:  37 year old white male known HIV noncompliant on meds (Biktarvy)-he is homeless Bipolar with schizophrenia Underlying EtOH drinking 1/2 gallon of vodka daily, EtOH withdrawal seizures Recent admission by critical care medicine 2/5 through 12/22/2021 with abdominal pain, severe DTs placed on Precedex Librium clonidine-->left AMA from that hospital stay   Readmitted by critical care medicine 12/23/2022--he had left the ED AMA on 2/13 after consuming an unclear amount of  EtOH 24 ounces and felt abdominal pain and came back to the hospital - Found to have AKI creatinine 0.7-->1.9 lipase 113 troponin mildly elevated 61 ethanol level 52 CT head atrophic changes Stable 10 mm pancreatic pseudocyst with Hyper-Tet steatosis and moderate prostatomegaly   Started on Precedex in ICU 2/16 transferred to hospitalist service  Hospital Course:  Alcohol withdrawal, ethanolism - CIWA scores 4-5 and discontinued protocol - Refilled his Keppra on discharge and he will need to pick this up from the [place] outpatient pharmacy in the morning  Mild pancreatitis on admission, no pseudocyst - Resolved patient eating   Bipolar with schizophrenia, PTSD -Not taking any meds? -Psychiatry input on 2/15 appreciated--he is declining taking any of his prior meds that they had suggested -I did tell him I think he can quit drinking and his other things but he needs community, support, AA program and have encouraged him to do all of these things -Patient verbalized to nursing staff on discharge "I really could use a drink"!,!,!   AKI on admission Hypokalemia, hypomagnesemia -Hypokalemia is resolved   Troponin elevation without chest pain - Probably related to pancreatitis physiology versus not - As no chest pain no further workup   HIV with noncompliance no AIDS - No thrush some diarrhea but seen to have resolved - Resuming Biktarvy-appreciate ID assistance - Patient is leukopenic probably because of his HIV   Severe protein energy malnutrition present on admission secondary to HIV noncompliance poor food security as homeless - Monitor   Discharge Exam: Vitals:   12/26/22 2002 12/27/22 0417  BP: 101/65 95/62  Pulse: 60 (!) 59  Resp: 18   Temp: 98.8 F (37.1 C) 98.2 F (  36.8 C)  SpO2: 96% 99%    Subj on day of d/c   Coherent pleasant oriented no distress Cachectic Abd soft nt nd no rebound No LE edema   Discharge Instructions   Discharge Instructions      Diet - low sodium heart healthy   Complete by: As directed    Discharge instructions   Complete by: As directed    Please desist from drinking-do your best to get into an AA group/12-step program which can give you community and help you Continue to take your Biktarvy and other meds for your HIV--- infectious disease will call & follow-up with you   Increase activity slowly   Complete by: As directed       Allergies as of 12/27/2022       Reactions   Tegretol [carbamazepine] Other (See Comments)   Caused vertigo for 2 days after taking it   Caffeine Palpitations        Medication List     TAKE these medications    Biktarvy 50-200-25 MG Tabs tablet Generic drug: bictegravir-emtricitabine-tenofovir AF Take 1 tablet by mouth daily.   folic acid 1 MG tablet Commonly known as: FOLVITE Take 1 tablet (1 mg total) by mouth daily.   levETIRAcetam 500 MG tablet Commonly known as: KEPPRA Take 1 tablet (500 mg total) by mouth 2 (two) times daily.   thiamine 100 MG tablet Commonly known as: VITAMIN B1 Take 1 tablet (100 mg total) by mouth daily.       Allergies  Allergen Reactions   Tegretol [Carbamazepine] Other (See Comments)    Caused vertigo for 2 days after taking it   Caffeine Palpitations      The results of significant diagnostics from this hospitalization (including imaging, microbiology, ancillary and laboratory) are listed below for reference.    Significant Diagnostic Studies: CT ABDOMEN PELVIS W CONTRAST  Result Date: 12/23/2022 CLINICAL DATA:  Epigastric pain. EXAM: CT ABDOMEN AND PELVIS WITH CONTRAST TECHNIQUE: Multidetector CT imaging of the abdomen and pelvis was performed using the standard protocol following bolus administration of intravenous contrast. RADIATION DOSE REDUCTION: This exam was performed according to the departmental dose-optimization program which includes automated exposure control, adjustment of the mA and/or kV according to  patient size and/or use of iterative reconstruction technique. CONTRAST:  59m OMNIPAQUE IOHEXOL 300 MG/ML  SOLN COMPARISON:  September 16, 2022 FINDINGS: Lower chest: Moderate severity left basilar, mild lingular and mild posterior right basilar atelectasis is seen. Stable elevation of the left hemidiaphragm is noted. Hepatobiliary: There is diffuse fatty infiltration of the liver parenchyma. No focal liver abnormality is seen. No gallstones, gallbladder wall thickening, or biliary dilatation. Pancreas: A stable 10 mm diameter cyst is seen within the body of the pancreas. The tail of the pancreas is not clearly identified. Very mild peripancreatic inflammatory fat stranding is seen surrounding the head of the pancreas. No pancreatic ductal dilatation is noted. Spleen: Normal in size without focal abnormality. Adrenals/Urinary Tract: Adrenal glands are unremarkable. Kidneys are normal in size, without renal calculi or hydronephrosis. A stable 7 mm diameter simple cyst is seen within the lower pole of the right kidney. Predominant stable, moderate severity diffuse urinary bladder wall thickening is noted. Stomach/Bowel: Stomach is within normal limits. Appendix appears normal. No evidence of bowel wall thickening, distention, or inflammatory changes. Vascular/Lymphatic: No significant vascular findings are present. No enlarged abdominal or pelvic lymph nodes. Reproductive: There is stable mild to moderate severity prostatomegaly. Other: No abdominal wall hernia or  abnormality. No abdominopelvic ascites. Musculoskeletal: No acute or significant osseous findings. IMPRESSION: 1. Very mild acute pancreatitis. Correlation with pancreatic enzymes is recommended. 2. Findings likely representing a stable 10 mm diameter pancreatic pseudocyst. 3. Hepatic steatosis. 4. Stable, moderate severity diffuse urinary bladder wall thickening. While this may be, in part, secondary to chronic bladder outlet obstruction, sequelae associated  with acute cystitis cannot be excluded. Correlation with urinalysis is recommended. 5. Stable elevation of the left hemidiaphragm. 6. Stable mild to moderate severity prostatomegaly. Electronically Signed   By: Virgina Norfolk M.D.   On: 12/23/2022 00:03   CT Head Wo Contrast  Result Date: 12/22/2022 CLINICAL DATA:  Mental status change EXAM: CT HEAD WITHOUT CONTRAST TECHNIQUE: Contiguous axial images were obtained from the base of the skull through the vertex without intravenous contrast. RADIATION DOSE REDUCTION: This exam was performed according to the departmental dose-optimization program which includes automated exposure control, adjustment of the mA and/or kV according to patient size and/or use of iterative reconstruction technique. COMPARISON:  CT head 11/08/2022.  MRI brain 12/31/2021. FINDINGS: Brain: No evidence of acute infarction, hemorrhage, hydrocephalus, extra-axial collection or mass lesion/mass effect. Again seen are mild diffuse atrophic changes, advanced for patient's age. Vascular: No hyperdense vessel or unexpected calcification. Skull: Normal. Negative for fracture or focal lesion. Sinuses/Orbits: No acute finding. There is mucosal thickening of the maxillary sinuses. Mastoid air cells are clear. Orbits are within normal limits. Other: None. IMPRESSION: 1. No acute intracranial process. 2. Mild diffuse atrophic changes, advanced for patient's age. Electronically Signed   By: Ronney Asters M.D.   On: 12/22/2022 23:58   DG Chest Portable 1 View  Result Date: 12/22/2022 CLINICAL DATA:  Follow-up pneumonia. EXAM: PORTABLE CHEST 1 VIEW COMPARISON:  December 19, 2022 FINDINGS: The heart size and mediastinal contours are within normal limits. Low lung volumes are noted with stable elevation of the left hemidiaphragm. Mild, stable left basilar atelectasis and/or infiltrate is noted. There is no evidence of a pleural effusion or pneumothorax. The visualized skeletal structures are  unremarkable. IMPRESSION: Mild, stable left basilar atelectasis and/or infiltrate. Electronically Signed   By: Virgina Norfolk M.D.   On: 12/22/2022 23:35   DG CHEST PORT 1 VIEW  Result Date: 12/19/2022 CLINICAL DATA:  Hypoxemia EXAM: PORTABLE CHEST 1 VIEW COMPARISON:  Chest x-ray December 14, 2022 FINDINGS: There is chronic elevation of the left hemidiaphragm. There is increased opacity adjacent to the elevated left hemidiaphragm. Left side of the heart is obscured by the elevated left hemidiaphragm. The cardiomediastinal silhouette is stable within visualize limits. No pneumothorax. No nodules or masses. The right lung is clear. IMPRESSION: 1. Chronic elevation of the left hemidiaphragm. 2. Increased opacity adjacent to the elevated left hemidiaphragm could represent atelectasis or developing infiltrate. Recommend short-term follow-up imaging to ensure resolution. Electronically Signed   By: Dorise Bullion III M.D.   On: 12/19/2022 10:07   US Abdomen Limited RUQ (LIVER/GB)  Result Date: 12/14/2022 CLINICAL DATA:  Elevated LFT EXAM: ULTRASOUND ABDOMEN LIMITED RIGHT UPPER QUADRANT COMPARISON:  CT 09/16/2022 FINDINGS: Gallbladder: No gallstones or wall thickening visualized. No sonographic Murphy sign noted by sonographer. Common bile duct: Diameter: 4.6 mm Liver: Echogenic liver parenchyma. No focal hepatic abnormality. Portal vein is patent on color Doppler imaging with normal direction of blood flow towards the liver. Other: Small free fluid in the right upper quadrant IMPRESSION: 1. Negative for gallstones. 2. Echogenic liver parenchyma consistent with hepatic steatosis 3. Small amount of free fluid in the right upper  quadrant. Electronically Signed   By: Donavan Foil M.D.   On: 12/14/2022 19:34   DG Chest Portable 1 View  Result Date: 12/14/2022 CLINICAL DATA:  Hypoxia EXAM: PORTABLE CHEST 1 VIEW COMPARISON:  11/08/2022 FINDINGS: Normal cardiac silhouette. Chronic elevation of LEFT hemidiaphragm.  There is patchy airspace disease in the LEFT lower lobe. RIGHT lung clear. No pneumothorax. No fracture. IMPRESSION: 1. LEFT lobe atelectasis versus infiltrate. Findings similar to comparison exam. Electronically Signed   By: Suzy Bouchard M.D.   On: 12/14/2022 14:43    Microbiology: No results found for this or any previous visit (from the past 240 hour(s)).   Labs: Basic Metabolic Panel: Recent Labs  Lab 12/22/22 0312 12/22/22 2024 12/23/22 1501 12/24/22 0255 12/24/22 2335 12/25/22 0422 12/26/22 0411 12/27/22 0357  NA 137   < > 136 136 135 136 137 136  K 3.4*   < > 3.7 3.2* 3.8 3.7 3.8 3.7  CL 104   < > 110 104 102 104 105 103  CO2 23   < > 21* 25 25 24 24 25  $ GLUCOSE 101*   < > 117* 127* 102* 96 97 110*  BUN 6   < > <5* <5* <5* <5* 10 15  CREATININE 0.76   < > 0.62 0.63 0.83 0.79 0.87 0.61  CALCIUM 9.1   < > 8.0* 8.6* 8.9 8.9 9.2 9.5  MG 1.8  --  1.8 1.6* 2.2 2.0  --   --   PHOS  --   --  3.8 3.6  --   --   --   --    < > = values in this interval not displayed.   Liver Function Tests: Recent Labs  Lab 12/22/22 0312 12/22/22 2024 12/23/22 1501 12/26/22 0411 12/27/22 0357  AST 37 31 25 25 27  $ ALT 36 35 27 29 35  ALKPHOS 61 59 62 53 52  BILITOT 0.8 0.5 0.6 0.4 0.4  PROT 6.7 6.6 6.0* 6.4* 6.7  ALBUMIN 3.1* 3.3* 2.9* 3.3* 3.5   Recent Labs  Lab 12/22/22 0312 12/23/22 0312  LIPASE 108* 113*   Recent Labs  Lab 12/23/22 0007  AMMONIA 26   CBC: Recent Labs  Lab 12/22/22 2024 12/23/22 0516 12/24/22 0255 12/26/22 0411 12/27/22 0357  WBC 5.4 3.7* 2.9* 3.2* 3.9*  HGB 12.3* 11.5* 12.8* 12.9* 12.8*  HCT 38.2* 34.9* 39.0 39.7 40.0  MCV 102.1* 102.0* 99.5 101.0* 102.6*  PLT 323 272 289 331 343   Cardiac Enzymes: Recent Labs  Lab 12/23/22 0145  CKTOTAL 222   BNP: BNP (last 3 results) No results for input(s): "BNP" in the last 8760 hours.  ProBNP (last 3 results) No results for input(s): "PROBNP" in the last 8760 hours.  CBG: Recent Labs  Lab  12/21/22 0756 12/21/22 1725 12/22/22 0846 12/22/22 2050 12/23/22 0257  GLUCAP 113* 98 100* 92 94       Signed:  Nita Sells MD   [facility] 12/27/2022, 1:47 PM

## 2022-12-28 ENCOUNTER — Other Ambulatory Visit: Payer: Self-pay

## 2022-12-28 ENCOUNTER — Other Ambulatory Visit (HOSPITAL_COMMUNITY): Payer: Self-pay

## 2022-12-30 ENCOUNTER — Other Ambulatory Visit: Payer: Self-pay

## 2022-12-30 ENCOUNTER — Emergency Department (HOSPITAL_COMMUNITY)
Admission: EM | Admit: 2022-12-30 | Discharge: 2022-12-31 | Disposition: A | Discharge status: Home / Self Care | Payer: Medicaid Other | Attending: Emergency Medicine | Admitting: Emergency Medicine

## 2022-12-30 ENCOUNTER — Emergency Department (HOSPITAL_COMMUNITY): Payer: Medicaid Other

## 2022-12-30 ENCOUNTER — Encounter (HOSPITAL_COMMUNITY): Payer: Self-pay

## 2022-12-30 DIAGNOSIS — F1024 Alcohol dependence with alcohol-induced mood disorder: Secondary | ICD-10-CM | POA: Insufficient documentation

## 2022-12-30 DIAGNOSIS — F10920 Alcohol use, unspecified with intoxication, uncomplicated: Secondary | ICD-10-CM

## 2022-12-30 DIAGNOSIS — R45851 Suicidal ideations: Secondary | ICD-10-CM | POA: Diagnosis not present

## 2022-12-30 DIAGNOSIS — F10239 Alcohol dependence with withdrawal, unspecified: Secondary | ICD-10-CM | POA: Diagnosis present

## 2022-12-30 DIAGNOSIS — S8002XA Contusion of left knee, initial encounter: Secondary | ICD-10-CM | POA: Diagnosis not present

## 2022-12-30 DIAGNOSIS — Y92480 Sidewalk as the place of occurrence of the external cause: Secondary | ICD-10-CM | POA: Diagnosis not present

## 2022-12-30 DIAGNOSIS — W0110XA Fall on same level from slipping, tripping and stumbling with subsequent striking against unspecified object, initial encounter: Secondary | ICD-10-CM | POA: Insufficient documentation

## 2022-12-30 DIAGNOSIS — S065XAA Traumatic subdural hemorrhage with loss of consciousness status unknown, initial encounter: Secondary | ICD-10-CM | POA: Diagnosis present

## 2022-12-30 DIAGNOSIS — Z21 Asymptomatic human immunodeficiency virus [HIV] infection status: Secondary | ICD-10-CM | POA: Insufficient documentation

## 2022-12-30 DIAGNOSIS — S60512A Abrasion of left hand, initial encounter: Secondary | ICD-10-CM | POA: Diagnosis not present

## 2022-12-30 DIAGNOSIS — Z5900 Homelessness unspecified: Secondary | ICD-10-CM | POA: Insufficient documentation

## 2022-12-30 DIAGNOSIS — F17210 Nicotine dependence, cigarettes, uncomplicated: Secondary | ICD-10-CM | POA: Insufficient documentation

## 2022-12-30 DIAGNOSIS — F10220 Alcohol dependence with intoxication, uncomplicated: Secondary | ICD-10-CM | POA: Insufficient documentation

## 2022-12-30 DIAGNOSIS — S065X0A Traumatic subdural hemorrhage without loss of consciousness, initial encounter: Secondary | ICD-10-CM | POA: Insufficient documentation

## 2022-12-30 DIAGNOSIS — F10230 Alcohol dependence with withdrawal, uncomplicated: Secondary | ICD-10-CM | POA: Insufficient documentation

## 2022-12-30 DIAGNOSIS — F1094 Alcohol use, unspecified with alcohol-induced mood disorder: Secondary | ICD-10-CM | POA: Diagnosis present

## 2022-12-30 DIAGNOSIS — S0990XA Unspecified injury of head, initial encounter: Secondary | ICD-10-CM | POA: Diagnosis present

## 2022-12-30 DIAGNOSIS — S0083XA Contusion of other part of head, initial encounter: Secondary | ICD-10-CM

## 2022-12-30 DIAGNOSIS — W19XXXA Unspecified fall, initial encounter: Secondary | ICD-10-CM

## 2022-12-30 LAB — CBC
HCT: 38.5 % — ABNORMAL LOW (ref 39.0–52.0)
Hemoglobin: 12.9 g/dL — ABNORMAL LOW (ref 13.0–17.0)
MCH: 33.6 pg (ref 26.0–34.0)
MCHC: 33.5 g/dL (ref 30.0–36.0)
MCV: 100.3 fL — ABNORMAL HIGH (ref 80.0–100.0)
Platelets: 466 10*3/uL — ABNORMAL HIGH (ref 150–400)
RBC: 3.84 MIL/uL — ABNORMAL LOW (ref 4.22–5.81)
RDW: 14.2 % (ref 11.5–15.5)
WBC: 5.1 10*3/uL (ref 4.0–10.5)
nRBC: 0 % (ref 0.0–0.2)

## 2022-12-30 LAB — RAPID URINE DRUG SCREEN, HOSP PERFORMED
Amphetamines: NOT DETECTED
Barbiturates: NOT DETECTED
Benzodiazepines: POSITIVE — AB
Cocaine: NOT DETECTED
Opiates: NOT DETECTED
Tetrahydrocannabinol: NOT DETECTED

## 2022-12-30 MED ORDER — PROMETHAZINE HCL 25 MG PO TABS
25.0000 mg | ORAL_TABLET | Freq: Once | ORAL | Status: AC
  Administered 2022-12-30: 25 mg via ORAL
  Filled 2022-12-30: qty 1

## 2022-12-30 MED ORDER — THIAMINE MONONITRATE 100 MG PO TABS
100.0000 mg | ORAL_TABLET | Freq: Every day | ORAL | Status: DC
  Administered 2022-12-31: 100 mg via ORAL
  Filled 2022-12-30 (×3): qty 1

## 2022-12-30 NOTE — ED Provider Notes (Signed)
[facility] Provider Note   CSN: [id number] Arrival date & time: 12/30/22  2148     History {Add pertinent medical, surgical, social history, OB history to HPI:1} Chief Complaint  Patient presents with   Fall   Suicidal   Hallucinations    Gary [name] is a 37 y.o. male.  37 year old male presents to the emergency department via EMS following a mechanical fall.  He reports that he drank 1/5 of liquor today, last around 1700.  Blames his fall on the unevenness of the sidewalk.  Struck his head on the right side, above his left eye also with abrasions of the left hand and contusion of the left knee.  No medications received prior to arrival.  Unable to indicate whether or not he lost consciousness.  He has not had any vomiting.  Complaining also of auditory hallucinations as well as command hallucinations to hurt others.  Reports that he would "jump off a bridge" to kill himself.  The history is provided by the patient. No language interpreter was used.  Fall       Home Medications Prior to Admission medications   Medication Sig Start Date End Date Taking? Authorizing Provider  bictegravir-emtricitabine-tenofovir AF (BIKTARVY) 50-200-25 MG TABS tablet Take 1 tablet by mouth daily. 12/25/22 01/24/23  Truman Hayward, MD  folic acid (FOLVITE) 1 MG tablet Take 1 tablet (1 mg total) by mouth daily. 10/23/22   Shelly Coss, MD  levETIRAcetam (KEPPRA) 500 MG tablet Take 1 tablet (500 mg total) by mouth 2 (two) times daily. 12/27/22   Nita Sells, MD  thiamine (VITAMIN B1) 100 MG tablet Take 1 tablet (100 mg total) by mouth daily. 10/23/22   Shelly Coss, MD  famotidine (PEPCID) 20 MG tablet Take 1 tablet (20 mg total) by mouth 2 (two) times daily. Patient not taking: Reported on 06/01/2019 05/14/19 06/02/19  Davonna Belling, MD      Allergies    Tegretol [carbamazepine] and Caffeine    Review of Systems   Review of  Systems Ten systems reviewed and are negative for acute change, except as noted in the HPI.    Physical Exam Updated Vital Signs BP 107/67   Pulse 99   Temp 98.2 F (36.8 C) (Oral)   Resp 18   Ht 6' (1.829 m)   Wt 65.8 kg   SpO2 97%   BMI 19.67 kg/m   Physical Exam Vitals and nursing note reviewed.  Constitutional:      General: He is not in acute distress.    Appearance: He is well-developed. He is not diaphoretic.     Comments: Chronically ill and disheveled appearing.  HENT:     Head: Normocephalic.   Eyes:     General: No scleral icterus.    Extraocular Movements: Extraocular movements intact.     Conjunctiva/sclera: Conjunctivae normal.     Comments: PERRL  Cardiovascular:     Rate and Rhythm: Normal rate and regular rhythm.     Pulses: Normal pulses.  Pulmonary:     Effort: Pulmonary effort is normal. No respiratory distress.     Comments: Respirations even and unlabored Musculoskeletal:        General: Normal range of motion.       Hands:     Cervical back: Normal range of motion.  Skin:    General: Skin is warm and dry.     Coloration: Skin is not pale.  Findings: No erythema or rash.  Neurological:     Mental Status: He is alert.     Coordination: Coordination normal.  Psychiatric:        Mood and Affect: Mood is depressed. Affect is flat.        Behavior: Behavior is slowed.        Thought Content: Thought content includes suicidal ideation. Thought content does not include homicidal plan.     ED Results / Procedures / Treatments   Labs (all labs ordered are listed, but only abnormal results are displayed) Labs Reviewed  COMPREHENSIVE METABOLIC PANEL  ETHANOL  SALICYLATE LEVEL  ACETAMINOPHEN LEVEL  CBC  RAPID URINE DRUG SCREEN, HOSP PERFORMED    EKG None  Radiology No results found.  Procedures Procedures  {Document cardiac monitor, telemetry assessment procedure when appropriate:1}  Medications Ordered in ED Medications   thiamine (VITAMIN B1) tablet 100 mg (has no administration in time range)  promethazine (PHENERGAN) tablet 25 mg (has no administration in time range)    ED Course/ Medical Decision Making/ A&P   {   Click here for ABCD2, HEART and other calculatorsREFRESH Note before signing :1}                          Medical Decision Making Amount and/or Complexity of Data Reviewed Radiology: ordered.  Risk OTC drugs. Prescription drug management.   ***  {Document critical care time when appropriate:1} {Document review of labs and clinical decision tools ie heart score, Chads2Vasc2 etc:1}  {Document your independent review of radiology images, and any outside records:1} {Document your discussion with family members, caretakers, and with consultants:1} {Document social determinants of health affecting pt's care:1} {Document your decision making why or why not admission, treatments were needed:1} Final Clinical Impression(s) / ED Diagnoses Final diagnoses:  None    Rx / DC Orders ED Discharge Orders     None

## 2022-12-30 NOTE — ED Triage Notes (Addendum)
Pt is homeless - he called EMS d/t Fall and c/o of left temple contusion and ;eft knee pain.  Pt also c/o Auditory  Hallucinations telling him to hurt people.  Pt states he als has SI - that he would "jump off a bridge".  Pt drank 1/5 of liquor today last drink 1700/1800

## 2022-12-31 DIAGNOSIS — F1094 Alcohol use, unspecified with alcohol-induced mood disorder: Secondary | ICD-10-CM

## 2022-12-31 LAB — COMPREHENSIVE METABOLIC PANEL
ALT: 70 U/L — ABNORMAL HIGH (ref 0–44)
AST: 66 U/L — ABNORMAL HIGH (ref 15–41)
Albumin: 3.6 g/dL (ref 3.5–5.0)
Alkaline Phosphatase: 53 U/L (ref 38–126)
Anion gap: 9 (ref 5–15)
BUN: 10 mg/dL (ref 6–20)
CO2: 25 mmol/L (ref 22–32)
Calcium: 8.8 mg/dL — ABNORMAL LOW (ref 8.9–10.3)
Chloride: 107 mmol/L (ref 98–111)
Creatinine, Ser: 0.6 mg/dL — ABNORMAL LOW (ref 0.61–1.24)
GFR, Estimated: >60 mL/min (ref >60)
Glucose, Bld: 107 mg/dL — ABNORMAL HIGH (ref 70–99)
Potassium: 3.7 mmol/L (ref 3.5–5.1)
Sodium: 141 mmol/L (ref 135–145)
Total Bilirubin: 0.5 mg/dL (ref 0.3–1.2)
Total Protein: 6.9 g/dL (ref 6.5–8.1)

## 2022-12-31 LAB — SALICYLATE LEVEL: Salicylate Lvl: <7 mg/dL — ABNORMAL LOW (ref 7.0–30.0)

## 2022-12-31 LAB — ACETAMINOPHEN LEVEL: Acetaminophen (Tylenol), Serum: <10 ug/mL — ABNORMAL LOW (ref 10–30)

## 2022-12-31 LAB — ETHANOL: Alcohol, Ethyl (B): 231 mg/dL — ABNORMAL HIGH (ref <10)

## 2022-12-31 MED ORDER — LORAZEPAM 1 MG PO TABS
1.0000 mg | ORAL_TABLET | ORAL | Status: DC | PRN
  Administered 2022-12-31: 3 mg via ORAL
  Filled 2022-12-31: qty 3

## 2022-12-31 MED ORDER — FOLIC ACID 1 MG PO TABS
1.0000 mg | ORAL_TABLET | Freq: Every day | ORAL | Status: DC
  Administered 2022-12-31: 1 mg via ORAL
  Filled 2022-12-31: qty 1

## 2022-12-31 MED ORDER — ADULT MULTIVITAMIN W/MINERALS CH
1.0000 | ORAL_TABLET | Freq: Every day | ORAL | Status: DC
  Administered 2022-12-31: 1 via ORAL
  Filled 2022-12-31: qty 1

## 2022-12-31 NOTE — ED Notes (Signed)
Patient resting comfortably at this time. No acute distress noted.

## 2022-12-31 NOTE — Progress Notes (Signed)
Substance abuse resources attached to the AVS. Number and address provided for the behavioral health hospital for medication management.

## 2022-12-31 NOTE — Discharge Instructions (Addendum)
Please call to seek med management services  [facility]  [place], [place] [address] [phone]

## 2022-12-31 NOTE — Consult Note (Signed)
Telepsych Consultation   Reason for Consult:  Telepsych Assessment  Referring Physician:  Antonietta Breach, PA-C  Location of Patient:    [facility] Location of Provider: Other: virtual   Patient Identification: BESIM CHERRY MRN:  [id number] Principal Diagnosis: Alcohol-induced mood disorder ([place]) Diagnosis:  Principal Problem:   Alcohol-induced mood disorder ([place]) Active Problems:   Traumatic subdural hematoma (H[facility])   Alcohol dependence with withdrawal ([place])   Total Time spent with patient: 30 minutes  Subjective:   DEVERNE BAZER is a 37 y.o. male patient admitted for fall, suicidal ideation and hallucinations.  Pt states, "I am not suicidal.  I just want to get out of here."  HPI:   Patient presented to the emergency room for evaluation of a fall in which he suffered a subdural hematoma to his left temple and left knee pain.  Pt evaluated and medically cleared prior to psych assessment. During assessment he endorsed chronic suicidal ideation, and a plan to jump from a bridge; his admission BAL was 231.  Pt is known to our facility for similar presentation.  He has a long standing history for alcohol dependence with alcohol induced mood disorder, polysubstance abuse, major depressive disorder, hx of seizures and medication non-compliance.  His circumstances are complicated by chronic homelessness.  Pt usually is suicidal when he's intoxicated but when he's sober no longer feels this way and can contract for safety.  He's been seen approximately 19x within the past 5 months, with 2 or more medical admissions for complicated alcohol withdrawals.  Pt previously referred for substance abuse treatment,  but has difficulty maintaining sobriety.    Patient seen via telepsych by this provider; chart reviewed and consulted with Dr. Junius Creamer on 12/31/22.  On evaluation JAURON FREERKSEN is seen dangled at the bedside, rocking.  Pt greeted by this Probation officer and anticipatory guidance provided.   Pt reports, "I'm alcohol sick so let's hurry up so I can leave and get a drink."  He is alert and oriented x4, clear and coherent and able to engage in lucid conversation with this Probation officer. He does endorse alcohol withdrawals, hand tremors and nausea.  Since arrival, he's been placed on CIWA protocol and given Ativan.  When asked about events that led to his current admission, pt states, "I'm not suicidal. I never wanted to kill myself, I was drunk and I sometimes say things that I don't mean when I'm drinking."    During evaluation DAILEY DORER is dangled at the bedside; he is alert/oriented x 4; anxious but cooperative; and mood congruent with affect.  Patient is speaking in a clear tone at moderate volume, and normal pace; with fair eye contact.  His thought process is coherent and ; There is no indication that he is currently responding to internal/external stimuli or experiencing delusional thought content.  Patient denies suicidal/self-harm/homicidal ideation, psychosis, and paranoia.  Patient has remained anxious but cooperative throughout assessment and has answered questions appropriately.    Per ED Provider Admission Note 12/30/2022: Chief Complaint  Patient presents with   Fall   Suicidal   Hallucinations      OLEN CHRISTODOULOU is a 37 y.o. male.   37 year old male presents to the emergency department via EMS following a mechanical fall.  He reports that he drank 1/5 of liquor today, last around 1700.  Blames his fall on the unevenness of the sidewalk.  Struck his head on the right side, above his left eye also with abrasions  of the left hand and contusion of the left knee.  No medications received prior to arrival.  Unable to indicate whether or not he lost consciousness.  He has not had any vomiting.  Complaining also of auditory hallucinations as well as command hallucinations to hurt others.  Reports that he would "jump off a bridge" to kill himself.   The history is provided by the  patient. No language interpreter was used.    Past Psychiatric History: as outlined below  Risk to Self:  no Risk to Others: no  Prior Inpatient Therapy: yes  Prior Outpatient Therapy:  pt denies  Past Medical History:  Past Medical History:  Diagnosis Date   Alcohol abuse    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   Anxiety    Bipolar 2 disorder ([place])    HIV (human immunodeficiency virus infection) ([place])    Pancreatitis    PTSD (post-traumatic stress disorder)    Schizophrenia ([place])    Seizures ([place])    Subdural hematoma (HCC)     Past Surgical History:  Procedure Laterality Date   BIOPSY  04/19/2022   Procedure: BIOPSY;  Surgeon: Irving Copas., MD;  Location[facility] END[facility]COPY;  Service: Gastroenterology;;   ENTEROSCOPY N/A 04/19/2022   Procedure: ENTEROSCOPY;  Surgeon: Irving Copas., MD;  Location: [facility];  Service: Gastroenterology;  Laterality: N/A;   INCISION AND DRAINAGE PERIRECTAL ABSCESS N/A 09/24/2016   Procedure: IRRIGATION AND DEBRIDEMENT PERIRECTAL ABSCESS;  Surgeon: Clayburn Pert, MD;  Location: [facility] ORS;  Service: General;  Laterality: N/A;   none     Family History:  Family History  Problem Relation Age of Onset   Alcohol abuse Mother    Alcohol abuse Father    Colon cancer Other    Other Other    Cancer Other    Family Psychiatric  History: denies Social History:  Social History   Substance and Sexual Activity  Alcohol Use Yes   Alcohol/week: 105.0 standard drinks of alcohol   Types: 105 Cans of beer per week     Social History   Substance and Sexual Activity  Drug Use Yes   Types: Methamphetamines   Comment: last used 04/10/2019    Social History   Socioeconomic History   Marital status: Single    Spouse name: Not on file   Number of children: Not on file   Years of education: Not on file   Highest education level: Not on file  Occupational History   Occupation: unemployed  Tobacco Use   Smoking status: Every  Day    Packs/day: 0.50    Types: Cigarettes   Smokeless tobacco: Never   Tobacco comments:    unable to smoke while incarcerated 6+ months 02/13/20  Vaping Use   Vaping Use: Never used  Substance and Sexual Activity   Alcohol use: Yes    Alcohol/week: 105.0 standard drinks of alcohol    Types: 105 Cans of beer per week   Drug use: Yes    Types: Methamphetamines    Comment: last used 04/10/2019   Sexual activity: Yes    Partners: Male, Male    Comment: declined condoms  03/2021  Other Topics Concern   Not on file  Social History Narrative   "Currently living on the streets"   Independent at baseline   Occasionally goes to the [facility] Determinants of Health   Financial Resource Strain: Not on file  Food Insecurity: No Food Insecurity (12/27/2022)   Hunger Vital Sign  Worried About Charity fundraiser in the Last Year: Never true    [place] in the Last Year: Never true  Recent Concern: Food Insecurity - Food Insecurity Present (12/24/2022)   Hunger Vital Sign    Worried About Running Out of Food in the Last Year: Sometimes true    Ran Out of Food in the Last Year: Sometimes true  Transportation Needs: Unmet Transportation Needs (12/27/2022)   PRAPARE - Hydrologist (Medical): Yes    Lack of Transportation (Non-Medical): Yes  Physical Activity: Not on file  Stress: Not on file  Social Connections: Not on file   Additional Social History:    Allergies:   Allergies  Allergen Reactions   Tegretol [Carbamazepine] Other (See Comments)    Caused vertigo for 2 days after taking it   Caffeine Palpitations    Labs:  Results for orders placed or performed during the hospital encounter of 12/30/22 (from the past 48 hour(s))  cbc     Status: Abnormal   Collection Time: 12/30/22 10:10 PM  Result Value Ref Range   WBC 5.1 4.0 - 10.5 K/uL   RBC 3.84 (L) 4.22 - 5.81 MIL/uL   Hemoglobin 12.9 (L) 13.0 - 17.0 g/dL   HCT 38.5 (L) 39.0 - 52.0  %   MCV 100.3 (H) 80.0 - 100.0 fL   MCH 33.6 26.0 - 34.0 pg   MCHC 33.5 30.0 - 36.0 g/dL   RDW 14.2 11.5 - 15.5 %   Platelets 466 (H) 150 - 400 K/uL   nRBC 0.0 0.0 - 0.2 %    Comment: Performed at [facility], [address] [place], [place] [address]  Rapid urine drug screen (hospital performed)     Status: Abnormal   Collection Time: 12/30/22 10:10 PM  Result Value Ref Range   Opiates NONE DETECTED NONE DETECTED   Cocaine NONE DETECTED NONE DETECTED   Benzodiazepines POSITIVE (A) NONE DETECTED   Amphetamines NONE DETECTED NONE DETECTED   Tetrahydrocannabinol NONE DETECTED NONE DETECTED   Barbiturates NONE DETECTED NONE DETECTED    Comment: (NOTE) DRUG SCREEN FOR MEDICAL PURPOSES ONLY.  IF CONFIRMATION IS NEEDED FOR ANY PURPOSE, NOTIFY LAB WITHIN 5 DAYS.  LOWEST DETECTABLE LIMITS FOR URINE DRUG SCREEN Drug Class                     Cutoff (ng/mL) Amphetamine and metabolites    1000 Barbiturate and metabolites    200 Benzodiazepine                 200 Opiates and metabolites        300 Cocaine and metabolites        300 THC                            50 Performed at [facility], [place] [address] [place], [place] [address]   Acetaminophen level     Status: Abnormal   Collection Time: 12/31/22 12:16 AM  Result Value Ref Range   Acetaminophen (Tylenol), Serum <10 (L) 10 - 30 ug/mL    Comment: (NOTE) Therapeutic concentrations vary significantly. A range of 10-30 ug/mL  may be an effective concentration for many patients. However, some  are best treated at concentrations outside of this range. Acetaminophen concentrations >150 ug/mL at 4 hours after ingestion  and >50 ug/mL at 12 hours after ingestion  are often associated with  toxic reactions.  Performed at [facility], [place] [address], [place], [place] [address]   Ethanol     Status: Abnormal   Collection Time: 12/31/22 12:16 AM  Result Value Ref Range   Alcohol, Ethyl (B) 231 (H) <10 mg/dL     Comment: (NOTE) Lowest detectable limit for serum alcohol is 10 mg/dL.  For medical purposes only. Performed at [facility], [place] [address], [place], [place] [id number]   Salicylate level     Status: Abnormal   Collection Time: 12/31/22 12:16 AM  Result Value Ref Range   Salicylate Lvl [id number] (L) 7.0 - 30.0 mg/dL    Comment: Performed at [facility] [address], [place], [place] [address]  Comprehensive metabolic panel     Status: Abnormal   Collection Time: 12/31/22 12:16 AM  Result Value Ref Range   Sodium 141 135 - 145 mmol/L   Potassium 3.7 3.5 - 5.1 mmol/L   Chloride 107 98 - 111 mmol/L   CO2 25 22 - 32 mmol/L   Glucose, Bld 107 (H) 70 - 99 mg/dL    Comment: Glucose reference range applies only to samples taken after fasting for at least 8 hours.   BUN 10 6 - 20 mg/dL   Creatinine, Ser 0.60 (L) 0.61 - 1.24 mg/dL   Calcium 8.8 (L) 8.9 - 10.3 mg/dL   Total Protein 6.9 6.5 - 8.1 g/dL   Albumin 3.6 3.5 - 5.0 g/dL   AST 66 (H) 15 - 41 U/L   ALT 70 (H) 0 - 44 U/L   Alkaline Phosphatase 53 38 - 126 U/L   Total Bilirubin 0.5 0.3 - 1.2 mg/dL   GFR, Estimated >60 >60 mL/min    Comment: (NOTE) Calculated using the CKD-EPI Creatinine Equation (2021)    Anion gap 9 5 - 15    Comment: Performed at [facility], [place] [address] [place], [place] [address]    Medications:  Current Facility-Administered Medications  Medication Dose Route Frequency Provider Last Rate Last Admin   folic acid (FOLVITE) tablet 1 mg  1 mg Oral Daily Georgina Snell C, MD   1 mg at 12/31/22 [id number]   LORazepam (ATIVAN) tablet 1-4 mg  1-4 mg Oral Q1H PRN Elgie Congo, MD   3 mg at 12/31/22 0915   multivitamin with minerals tablet 1 tablet  1 tablet Oral Daily Elgie Congo, MD   1 tablet at 12/31/22 [id number]   thiamine (VITAMIN B1) tablet 100 mg  100 mg Oral Daily Antonietta Breach, PA-C   100 mg at 12/31/22 0909   Current Outpatient Medications  Medication Sig Dispense Refill    bictegravir-emtricitabine-tenofovir AF (BIKTARVY) 50-200-25 MG TABS tablet Take 1 tablet by mouth daily. 30 tablet 0   folic acid (FOLVITE) 1 MG tablet Take 1 tablet (1 mg total) by mouth daily. 30 tablet 0   levETIRAcetam (KEPPRA) 500 MG tablet Take 1 tablet (500 mg total) by mouth 2 (two) times daily. 60 tablet 11   thiamine (VITAMIN B1) 100 MG tablet Take 1 tablet (100 mg total) by mouth daily. 30 tablet 0    Musculoskeletal: pt moves all extremities and ambulates independently.  Below is obtained from review of ED provider assessment notes.   Strength & Muscle Tone: within normal limits;  Gait & Station: normal Patient leans: Right    Psychiatric Specialty Exam:  Presentation  General Appearance:  Fairly Groomed; Appropriate for  Environment  Eye Contact: Fair  Speech: Clear and Coherent  Speech Volume: Normal  Handedness: Right   Mood and Affect  Mood: Anxious  Affect: Congruent   Thought Process  Thought Processes: Coherent; Goal Directed  Descriptions of Associations:Intact  Orientation:Full (Time, Place and Person)  Thought Content:Logical (has improved with waning alcohol)  History of Schizophrenia/Schizoaffective disorder:Yes  Duration of Psychotic Symptoms:No data recorded Hallucinations:Hallucinations: None  Ideas of Reference:None  Suicidal Thoughts:Suicidal Thoughts: No  Homicidal Thoughts:Homicidal Thoughts: No   Sensorium  Memory: Immediate Good  Judgment: -- (impulsive at baseline d/t chronic alcoholism and polysubstance abuse)  Insight: Fair (at baseline but no acute concerns)   Executive Functions  Concentration: Fair (reports he's anxious d/t alcohol withdrawals)  Attention Span: Fair  Recall: Good  Fund of Knowledge: Good  Language: Good   Psychomotor Activity  Psychomotor Activity:No data recorded  Assets  Assets: Communication Skills; Resilience   Sleep  Sleep:Sleep: Fair Number of Hours of  Sleep: 6    Physical Exam: Physical Exam Vitals and nursing note reviewed.  HENT:     Head:   Cardiovascular:     Rate and Rhythm: Normal rate.     Pulses: Normal pulses.  Musculoskeletal:        General: Normal range of motion.     Cervical back: Normal range of motion.  Neurological:     Mental Status: He is alert and oriented to person, place, and time.  Psychiatric:        Attention and Perception: Attention and perception normal.        Mood and Affect: Mood is anxious.        Speech: Speech normal.        Behavior: Behavior is cooperative.        Thought Content: Thought content normal. Thought content does not include homicidal or suicidal ideation. Thought content does not include homicidal or suicidal plan.        Cognition and Memory: Cognition and memory normal.        Judgment: Judgment is impulsive (at baseline d/t alcohol abuse and polysubstance abuse).    Review of Systems  Constitutional: Negative.   Eyes: Negative.   Respiratory: Negative.    Cardiovascular: Negative.   Gastrointestinal: Negative.   Genitourinary: Negative.   Musculoskeletal:  Positive for joint pain (left knee pain s/p fall prior to admission; already evaluated and treated by EDP).  Skin: Negative.   Neurological:  Positive for tremors. Negative for sensory change, speech change, seizures, loss of consciousness and weakness.  Endo/Heme/Allergies: Negative.   Psychiatric/Behavioral:  Positive for substance abuse. Negative for hallucinations and suicidal ideas. The patient is nervous/anxious.    Blood pressure 119/74, pulse 82, temperature 98.4 F (36.9 C), temperature source Oral, resp. rate 20, height 6' (1.829 m), weight 65.8 kg, SpO2 96 %. Body mass index is 19.67 kg/m.  Treatment Plan Summary: Patient presented to the emergency room for evaluation of a fall in which he suffered a subdural hematoma to his left temple and left knee pain.  Pt evaluated and medically cleared prior to  psych assessment. During assessment he endorsed chronic suicidal ideation, and a plan to jump from a bridge; his admission BAL was 231.  Pt is known to our facility for similar presentation.  He has a long standing history for alcohol dependence with alcohol induced mood disorder, polysubstance abuse, major depressive disorder, hx of seizures and medication non-compliance.  His circumstances are complicated by chronic homelessness.  Pt is  usually suicidal when he's intoxicated but when he's sober no longer feels this way and can contract for safety.  He's been seen approximately 19x within the past 5 months, with 2 or more medical admissions for complicated alcohol withdrawals.  Pt previously referred for substance abuse treatment,  but has difficulty maintaining sobriety.     On assessment today, pt denies SI, HI AVH and contracts for safety.  He is clear and coherent and able to engage in lucid conversation with this writer, no longer appears intoxicated.  States he never wanted to kill himself, it was the alcohol talking.  He is alert and oriented and has demonstrated decision making capacity.  Per chart review, pt is able to access emergent ED care when needed.  Pt declines referral for medical detox, and reequest discharge to go home and self manage his own. Pt as a history for seizures and each time he goes through medical detox his seizure threshold gets lower. Since he is not interested in detox and he does not have any imminent safety concerns today, pt is psych cleared; Pt is not interested in restarting medication, also sites cost as a barrier to medication compliance; He declines referral for detox but is willing to accept outpatient resources for [facility] health [facility] at [facility] where he can receive free medication management, detox assistance and counseling.    As per above, patient does not meet criteria for involuntary psychiatric admissions and states he is not interested in voluntary admissions.   He is psych cleared and recommended he follow up with outpatient resources added to discharge AVS.    We reviewed the importance of substance abuse abstinence; potential negative impact substance abuse can have on relationships and level of functioning.  Also reviewed the importance of medication compliance.   Disposition: No evidence of imminent risk to self or others at present.   Patient does not meet criteria for psychiatric inpatient admission. Supportive therapy provided about ongoing stressors. Discussed crisis plan, support from social network, calling 911, coming to the Emergency Department, and calling Suicide Hotline.  This service was provided via telemedicine using a 2-way, interactive audio and video technology.  Names of all persons participating in this telemedicine service and their role in this encounter. Name: Dushaun Nisbet Role: Patient  Name: Merlyn Lot Role: [place]  Name: Cloyde Reams [place] Role: Psychiatrist    Mallie Darting, NP 12/31/2022 1:47 PM

## 2022-12-31 NOTE — ED Provider Notes (Signed)
Clinical Course as of 12/31/22 0941  Thu Dec 31, 2022  0151 Patient medically cleared. Order placed for TTS consultation.  [id number] Patient psychiatrically cleared, he is denying any SI/HI/AVH.  He was only inducing suicidal ideation when intoxicated.  He would like to leave.  He will accept detox resources but would not like to detox at this time.  Resources provided for patient.  Discharged hemodynamically stable and in good condition. [VB]    Clinical Course User Index [KH] Antonietta Breach, PA-C [VB] Elgie Congo, MD      Elgie Congo, MD 12/31/22 [phone]

## 2023-01-02 ENCOUNTER — Emergency Department (HOSPITAL_COMMUNITY)
Admission: EM | Admit: 2023-01-02 | Discharge: 2023-01-03 | Disposition: A | Discharge status: Home / Self Care | Payer: Medicaid Other | Attending: Emergency Medicine | Admitting: Emergency Medicine

## 2023-01-02 ENCOUNTER — Other Ambulatory Visit: Payer: Self-pay

## 2023-01-02 DIAGNOSIS — R109 Unspecified abdominal pain: Secondary | ICD-10-CM | POA: Insufficient documentation

## 2023-01-02 DIAGNOSIS — F10920 Alcohol use, unspecified with intoxication, uncomplicated: Secondary | ICD-10-CM

## 2023-01-02 DIAGNOSIS — F10220 Alcohol dependence with intoxication, uncomplicated: Secondary | ICD-10-CM | POA: Diagnosis not present

## 2023-01-02 NOTE — ED Triage Notes (Signed)
Pt states he is here for "alcoholism"  Last drink this morning.  No complaints of pain.

## 2023-01-02 NOTE — ED Notes (Signed)
Pt has refused vitals at this time.

## 2023-01-03 LAB — URINALYSIS, ROUTINE W REFLEX MICROSCOPIC
Bilirubin Urine: NEGATIVE
Glucose, UA: NEGATIVE mg/dL
Hgb urine dipstick: NEGATIVE
Ketones, ur: NEGATIVE mg/dL
Leukocytes,Ua: NEGATIVE
Nitrite: NEGATIVE
Protein, ur: NEGATIVE mg/dL
Specific Gravity, Urine: 1.016 (ref 1.005–1.030)
pH: 5 (ref 5.0–8.0)

## 2023-01-03 LAB — BASIC METABOLIC PANEL
Anion gap: 11 (ref 5–15)
BUN: 7 mg/dL (ref 6–20)
CO2: 25 mmol/L (ref 22–32)
Calcium: 8.1 mg/dL — ABNORMAL LOW (ref 8.9–10.3)
Chloride: 106 mmol/L (ref 98–111)
Creatinine, Ser: 0.57 mg/dL — ABNORMAL LOW (ref 0.61–1.24)
GFR, Estimated: >60 mL/min (ref >60)
Glucose, Bld: 86 mg/dL (ref 70–99)
Potassium: 3.5 mmol/L (ref 3.5–5.1)
Sodium: 142 mmol/L (ref 135–145)

## 2023-01-03 LAB — CBC WITH DIFFERENTIAL/PLATELET
Abs Immature Granulocytes: 0.01 10*3/uL (ref 0.00–0.07)
Basophils Absolute: 0.1 10*3/uL (ref 0.0–0.1)
Basophils Relative: 2 %
Eosinophils Absolute: 0.2 10*3/uL (ref 0.0–0.5)
Eosinophils Relative: 6 %
HCT: 37 % — ABNORMAL LOW (ref 39.0–52.0)
Hemoglobin: 12 g/dL — ABNORMAL LOW (ref 13.0–17.0)
Immature Granulocytes: 0 %
Lymphocytes Relative: 59 %
Lymphs Abs: 2.3 10*3/uL (ref 0.7–4.0)
MCH: 33 pg (ref 26.0–34.0)
MCHC: 32.4 g/dL (ref 30.0–36.0)
MCV: 101.6 fL — ABNORMAL HIGH (ref 80.0–100.0)
Monocytes Absolute: 0.2 10*3/uL (ref 0.1–1.0)
Monocytes Relative: 4 %
Neutro Abs: 1.2 10*3/uL — ABNORMAL LOW (ref 1.7–7.7)
Neutrophils Relative %: 29 %
Platelets: 343 10*3/uL (ref 150–400)
RBC: 3.64 MIL/uL — ABNORMAL LOW (ref 4.22–5.81)
RDW: 14.3 % (ref 11.5–15.5)
WBC: 3.9 10*3/uL — ABNORMAL LOW (ref 4.0–10.5)
nRBC: 0 % (ref 0.0–0.2)

## 2023-01-03 NOTE — ED Provider Notes (Signed)
[place] AT [facility] Provider Note   CSN: [id number] Arrival date & time: 01/02/23  1921     History  Chief Complaint  Patient presents with   Alcohol Intoxication    Gary [name] is a 37 y.o. male.  HPI   Medical history including alcohol dependency, alcohol withdrawals, presents with complaints of kidney pain.  Patient states that he was drinking with his friends yesterday, without a mixers and believes that he is dehydrated hurt his kidneys.  He is not having any complaints denies any chest pain shortness of breath stomach pains nausea vomiting.  Denies any suicidal homicidal ideations.  Patient is well-known to the ED, has been seen here for similar presentations, was most recently seen on the 21st of a fall, benign workup and was discharged home.  He is also evaluated by psychiatry time for endorsement of suicidal ideations.  Home Medications Prior to Admission medications   Medication Sig Start Date End Date Taking? Authorizing Provider  bictegravir-emtricitabine-tenofovir AF (BIKTARVY) 50-200-25 MG TABS tablet Take 1 tablet by mouth daily. 12/25/22 01/24/23  Truman Hayward, MD  folic acid (FOLVITE) 1 MG tablet Take 1 tablet (1 mg total) by mouth daily. 10/23/22   Shelly Coss, MD  levETIRAcetam (KEPPRA) 500 MG tablet Take 1 tablet (500 mg total) by mouth 2 (two) times daily. 12/27/22   Nita Sells, MD  thiamine (VITAMIN B1) 100 MG tablet Take 1 tablet (100 mg total) by mouth daily. 10/23/22   Shelly Coss, MD  famotidine (PEPCID) 20 MG tablet Take 1 tablet (20 mg total) by mouth 2 (two) times daily. Patient not taking: Reported on 06/01/2019 05/14/19 06/02/19  Davonna Belling, MD      Allergies    Tegretol [carbamazepine] and Caffeine    Review of Systems   Review of Systems  Constitutional:  Negative for chills and fever.  Respiratory:  Negative for shortness of breath.   Cardiovascular:  Negative for chest pain.   Gastrointestinal:  Negative for abdominal pain.  Genitourinary:  Positive for flank pain.  Neurological:  Negative for headaches.    Physical Exam Updated Vital Signs BP 139/70 (BP Location: Left Arm)   Pulse 78   Temp 98 F (36.7 C) (Oral)   Resp 18   SpO2 97%  Physical Exam Vitals and nursing note reviewed.  Constitutional:      General: He is not in acute distress.    Appearance: He is not ill-appearing.  HENT:     Head: Normocephalic and atraumatic.     Comments: No deformity head present no raccoon eyes or Battle sign noted.    Nose: No congestion.     Mouth/Throat:     Comments: No trismus no torticollis no oral trauma noted Eyes:     Conjunctiva/sclera: Conjunctivae normal.  Cardiovascular:     Rate and Rhythm: Normal rate and regular rhythm.     Pulses: Normal pulses.     Heart sounds: No murmur heard.    No friction rub. No gallop.  Pulmonary:     Effort: No respiratory distress.     Breath sounds: No wheezing, rhonchi or rales.  Abdominal:     Palpations: Abdomen is soft.     Tenderness: There is no abdominal tenderness. There is right CVA tenderness and left CVA tenderness.     Comments: Abdomen nondistended, soft, noted flank tenderness as well as CVA tenderness bilaterally.  No guarding rebound tenderness or peritoneal sign.  Musculoskeletal:  Comments: Patient is moving his upper and lower extremities without difficulty.  Spine was palpated was nontender to palpation no step-off deformities noted, there is no overlying skin changes.  Patient has noted tenderness mainly in nature beneath the left and right ribs.  Pain is focalized and reproducible.  Skin:    General: Skin is warm and dry.  Neurological:     Mental Status: He is alert.     Comments: No facial asymmetry no difficulty with word finding following two-step commands there is no unilateral weakness present.  Psychiatric:        Mood and Affect: Mood normal.     ED Results / Procedures /  Treatments   Labs (all labs ordered are listed, but only abnormal results are displayed) Labs Reviewed  BASIC METABOLIC PANEL  CBC WITH DIFFERENTIAL/PLATELET  URINALYSIS, ROUTINE W REFLEX MICROSCOPIC    EKG None  Radiology No results found.  Procedures Procedures    Medications Ordered in ED Medications - No data to display  ED Course/ Medical Decision Making/ A&P                             Medical Decision Making Amount and/or Complexity of Data Reviewed Labs: ordered.   This patient presents to the ED for concern of kidney pain, this involves an extensive number of treatment options, and is a complaint that carries with it a high risk of complications and morbidity.  The differential diagnosis includes UTI, pyelo-, AKI, muscular strain    Additional history obtained:  Additional history obtained from N/A External records from outside source obtained and reviewed including recent ER notes   Co morbidities that complicate the patient evaluation  Alcohol dependency  Social Determinants of Health:  Homeless    Lab Tests:  I Ordered, and personally interpreted labs.  The pertinent results include: CBC unremarkable, BMP unremarkable, UA unremarkable   Imaging Studies ordered:  I ordered imaging studies including N/A I independently visualized and interpreted imaging which showed N/A I agree with the radiologist interpretation   Cardiac Monitoring:  The patient was maintained on a cardiac monitor.  I personally viewed and interpreted the cardiac monitored which showed an underlying rhythm of: N/A   Medicines ordered and prescription drug management:  I ordered medication including N/A I have reviewed the patients home medicines and have made adjustments as needed  Critical Interventions:  N/A   Reevaluation:  Presents with kidney pain, benign physical exam, will obtain basic lab work and reassess.  Patient is resting comfortably, he has  been ambulating, having no complaints, agreement with discharge at this time.   Consultations Obtained:  N/a    Test Considered:  N/a    Rule out Suspicion for psychiatric emergency is low at this time not endorsing any suicidal homicidal ideations, does not appear to be respond to internal stimuli.  I doubt metabolic derailment as lab work is unremarkable.  I doubt patient needs admitted for acute withdrawals as he is nontremulous on my exam, vital signs are reassuring.  Suspicion for UTI [place] or kidney stones also low as there is no evidence of infection or hematuria seen on UA.  I doubt spine equina as there is no red flag symptoms present my exam.  Doubt AAA or dissection as presentation atypical etiology as pain is focalized and easily reproducible, BP is not significantly elevated or hypotensive.  Suspicion for PE is also low at this time not  endorsing pleuritic chest pain shortness of breath and tachypnea not hypoxic no unilateral leg swelling noted my exam.    Dispostion and problem list  After consideration of the diagnostic results and the patients response to treatment, I feel that the patent would benefit from discharge.  Flank pain-likely muscular in nature, working with Him occasionally, follow-up with PCP as needed. Alcohol intoxication-uncomplicated, will have him follow-up with outpatient resources if he desires.            Final Clinical Impression(s) / ED Diagnoses Final diagnoses:  None    Rx / DC Orders ED Discharge Orders     None         Marcello Fennel, PA-C 01/03/23 0559    Quintella Reichert, MD 01/03/23 [phone]

## 2023-01-03 NOTE — Discharge Instructions (Addendum)
Lab work is all unremarkable, please use over-the-counter pain medication as needed.  I have given you information for outpatient counseling if you choose to seek help for alcohol use.  Also given information for shelters within the area.  Come back to the emergency department if you develop chest pain, shortness of breath, severe abdominal pain, uncontrolled nausea, vomiting, diarrhea.

## 2023-01-05 ENCOUNTER — Other Ambulatory Visit (HOSPITAL_COMMUNITY): Payer: Self-pay

## 2023-01-07 ENCOUNTER — Other Ambulatory Visit: Payer: Self-pay

## 2023-01-07 ENCOUNTER — Ambulatory Visit: Payer: Self-pay | Admitting: Infectious Diseases

## 2023-01-07 ENCOUNTER — Emergency Department (HOSPITAL_COMMUNITY)
Admission: EM | Admit: 2023-01-07 | Discharge: 2023-01-08 | Disposition: A | Discharge status: Home / Self Care | Payer: Medicaid Other | Attending: Emergency Medicine | Admitting: Emergency Medicine

## 2023-01-07 DIAGNOSIS — F10120 Alcohol abuse with intoxication, uncomplicated: Secondary | ICD-10-CM | POA: Insufficient documentation

## 2023-01-07 DIAGNOSIS — F10920 Alcohol use, unspecified with intoxication, uncomplicated: Secondary | ICD-10-CM

## 2023-01-07 NOTE — ED Triage Notes (Signed)
[place].  Bystander called.  Pt intoxicated.  Wished to be transported here for treatment of his alcoholism.

## 2023-01-07 NOTE — ED Notes (Addendum)
When attempting to obtain vitals pt stated "you better back the fuck up"

## 2023-01-07 NOTE — ED Provider Notes (Signed)
[name] Provider Note   CSN: [id number] Arrival date & time: 01/07/23  2027     History  Chief Complaint  Patient presents with   Alcohol Intoxication    Gary [name] is a 37 y.o. male.  The history is provided by the patient and medical records.  Alcohol Intoxication   37 y.o. M with hx of alcohol abuse here acutely intoxicated.  He was found heavily intoxicated downtown and bystander called EMS.  He requested transport here.  He does not really participate in history/physical.  Home Medications Prior to Admission medications   Medication Sig Start Date End Date Taking? Authorizing Provider  bictegravir-emtricitabine-tenofovir AF (BIKTARVY) 50-200-25 MG TABS tablet Take 1 tablet by mouth daily. 12/25/22 01/24/23  Truman Hayward, MD  folic acid (FOLVITE) 1 MG tablet Take 1 tablet (1 mg total) by mouth daily. 10/23/22   Shelly Coss, MD  levETIRAcetam (KEPPRA) 500 MG tablet Take 1 tablet (500 mg total) by mouth 2 (two) times daily. 12/27/22   Nita Sells, MD  thiamine (VITAMIN B1) 100 MG tablet Take 1 tablet (100 mg total) by mouth daily. 10/23/22   Shelly Coss, MD  famotidine (PEPCID) 20 MG tablet Take 1 tablet (20 mg total) by mouth 2 (two) times daily. Patient not taking: Reported on 06/01/2019 05/14/19 06/02/19  Davonna Belling, MD      Allergies    Tegretol [carbamazepine] and Caffeine    Review of Systems   Review of Systems  Unable to perform ROS: Other    Physical Exam Updated Vital Signs BP 120/68 (BP Location: Right Arm)   Pulse 72   Temp (!) 97.5 F (36.4 C) (Oral)   Resp 16   SpO2 99%   Physical Exam Vitals and nursing note reviewed.  Constitutional:      Appearance: He is well-developed.     Comments: Sleeping on stretcher  HENT:     Head: Normocephalic and atraumatic.  Eyes:     Conjunctiva/sclera: Conjunctivae normal.     Pupils: Pupils are equal, round, and reactive to  light.  Cardiovascular:     Rate and Rhythm: Normal rate and regular rhythm.     Heart sounds: Normal heart sounds.  Pulmonary:     Effort: Pulmonary effort is normal.     Breath sounds: Normal breath sounds.  Musculoskeletal:        General: Normal range of motion.     Cervical back: Normal range of motion.  Skin:    General: Skin is warm and dry.  Neurological:     Mental Status: He is oriented to person, place, and time.     ED Results / Procedures / Treatments   Labs (all labs ordered are listed, but only abnormal results are displayed) Labs Reviewed - No data to display  EKG None  Radiology No results found.  Procedures Procedures    Medications Ordered in ED Medications - No data to display  ED Course/ Medical Decision Making/ A&P                             Medical Decision Making  37 y.o. M here acutely intoxicated.  Longstanding hx of same.  No acute signs of trauma on exam.  Does have some desaturations while sleeping.  Will monitor here until clinically sober.  Anticipate discharge.  Final Clinical Impression(s) / ED Diagnoses Final diagnoses:  Alcoholic intoxication without  complication [facility])    Rx / DC Orders ED Discharge Orders     None         Larene Pickett, PA-C 01/08/23 0439    Margette Fast, MD 01/11/23 [phone]

## 2023-01-08 NOTE — Discharge Instructions (Addendum)
Drink responsibly.

## 2023-01-08 NOTE — ED Notes (Signed)
2LITERS OF O2 via [place]

## 2023-01-08 NOTE — ED Notes (Signed)
Patient is sleeping comfortably. Bed rails up and call bell at bedside.

## 2023-01-10 ENCOUNTER — Other Ambulatory Visit: Payer: Self-pay

## 2023-01-10 ENCOUNTER — Emergency Department (HOSPITAL_COMMUNITY): Payer: Medicaid Other

## 2023-01-10 ENCOUNTER — Emergency Department (HOSPITAL_COMMUNITY)
Admission: EM | Admit: 2023-01-10 | Discharge: 2023-01-11 | Disposition: A | Discharge status: Home / Self Care | Payer: Medicaid Other | Attending: Emergency Medicine | Admitting: Emergency Medicine

## 2023-01-10 ENCOUNTER — Encounter (HOSPITAL_COMMUNITY): Payer: Self-pay

## 2023-01-10 DIAGNOSIS — F1090 Alcohol use, unspecified, uncomplicated: Secondary | ICD-10-CM | POA: Insufficient documentation

## 2023-01-10 DIAGNOSIS — M25562 Pain in left knee: Secondary | ICD-10-CM | POA: Insufficient documentation

## 2023-01-10 DIAGNOSIS — W19XXXA Unspecified fall, initial encounter: Secondary | ICD-10-CM

## 2023-01-10 DIAGNOSIS — W1830XA Fall on same level, unspecified, initial encounter: Secondary | ICD-10-CM | POA: Diagnosis not present

## 2023-01-10 MED ORDER — PROMETHAZINE HCL 25 MG PO TABS
12.5000 mg | ORAL_TABLET | Freq: Once | ORAL | Status: AC
  Administered 2023-01-10: 12.5 mg via ORAL
  Filled 2023-01-10: qty 1

## 2023-01-10 MED ORDER — KETOROLAC TROMETHAMINE 15 MG/ML IJ SOLN
15.0000 mg | Freq: Once | INTRAMUSCULAR | Status: AC
  Administered 2023-01-10: 15 mg via INTRAMUSCULAR
  Filled 2023-01-10: qty 1

## 2023-01-10 NOTE — ED Notes (Signed)
Attempted to ambulate pt but too unsteady to walk

## 2023-01-10 NOTE — ED Provider Notes (Signed)
[place] Provider Note   CSN: [id number] Arrival date & time: 01/10/23  2020     History  Chief Complaint  Patient presents with   Gary [name] is a 37 y.o. male.  [place] to the ED via EMS from [facility] after reportedly falling.  He states his knees "stopped working" and his feet went out from under him.  Not sure if he hit his head complaining of left knee pain from a fall several days ago.  He reports he drank 3 bottles of wine today which he normally drinks more than that.  He reports he wants to stop drinking.  He does report some nausea and is asking for Phenergan.    Fall       Home Medications Prior to Admission medications   Medication Sig Start Date End Date Taking? Authorizing Provider  bictegravir-emtricitabine-tenofovir AF (BIKTARVY) 50-200-25 MG TABS tablet Take 1 tablet by mouth daily. 12/25/22 01/24/23  Truman Hayward, MD  folic acid (FOLVITE) 1 MG tablet Take 1 tablet (1 mg total) by mouth daily. 10/23/22   Shelly Coss, MD  levETIRAcetam (KEPPRA) 500 MG tablet Take 1 tablet (500 mg total) by mouth 2 (two) times daily. 12/27/22   Nita Sells, MD  thiamine (VITAMIN B1) 100 MG tablet Take 1 tablet (100 mg total) by mouth daily. 10/23/22   Shelly Coss, MD  famotidine (PEPCID) 20 MG tablet Take 1 tablet (20 mg total) by mouth 2 (two) times daily. Patient not taking: Reported on 06/01/2019 05/14/19 06/02/19  Davonna Belling, MD      Allergies    Tegretol [carbamazepine] and Caffeine    Review of Systems   Review of Systems  Physical Exam Updated Vital Signs BP (!) 106/57   Pulse (!) 102   Temp 98.5 F (36.9 C) (Oral)   Resp 16   Ht 6' (1.829 m)   Wt 65.7 kg   SpO2 95%   BMI 19.64 kg/m  Physical Exam Vitals and nursing note reviewed.  Constitutional:      General: He is not in acute distress.    Appearance: He is well-developed.  HENT:     Head: Normocephalic and atraumatic.   Eyes:     Conjunctiva/sclera: Conjunctivae normal.  Cardiovascular:     Rate and Rhythm: Normal rate and regular rhythm.     Heart sounds: No murmur heard. Pulmonary:     Effort: Pulmonary effort is normal. No respiratory distress.     Breath sounds: Normal breath sounds.  Abdominal:     Palpations: Abdomen is soft.     Tenderness: There is no abdominal tenderness.  Musculoskeletal:        General: No swelling.     Cervical back: Neck supple.     Comments: Healing abrasion left knee, normal ROM bilateral knees, able to bear weight   Skin:    General: Skin is warm and dry.     Capillary Refill: Capillary refill takes less than 2 seconds.  Neurological:     Mental Status: He is alert.  Psychiatric:        Mood and Affect: Mood normal.     ED Results / Procedures / Treatments   Labs (all labs ordered are listed, but only abnormal results are displayed) Labs Reviewed - No data to display  EKG None  Radiology CT Head Wo Contrast  Result Date: 01/10/2023 CLINICAL DATA:  Fall.  Head and neck trauma,  abnormal mental status. EXAM: CT HEAD WITHOUT CONTRAST CT CERVICAL SPINE WITHOUT CONTRAST TECHNIQUE: Multidetector CT imaging of the head and cervical spine was performed following the standard protocol without intravenous contrast. Multiplanar CT image reconstructions of the cervical spine were also generated. RADIATION DOSE REDUCTION: This exam was performed according to the departmental dose-optimization program which includes automated exposure control, adjustment of the mA and/or kV according to patient size and/or use of iterative reconstruction technique. COMPARISON:  12/30/2022. FINDINGS: CT HEAD FINDINGS Brain: No acute intracranial hemorrhage, midline shift or mass effect. No extra-axial fluid collection. Mild diffuse atrophy is noted. Gray-white matter differentiation is within normal limits. No hydrocephalus. Vascular: No hyperdense vessel or unexpected calcification. Skull:  Normal. Negative for fracture or focal lesion. Sinuses/Orbits: No acute finding. Other: None. CT CERVICAL SPINE FINDINGS Alignment: There is mild stairstep retrolisthesis at C2-C3, C3-C4, and C4-C5. Skull base and vertebrae: No acute fracture. No primary bone lesion or focal pathologic process. Soft tissues and spinal canal: No prevertebral fluid or swelling. No visible canal hematoma. Disc levels: Intervertebral disc space narrowing and degenerative endplate changes, most pronounced at C6-C7. Mild facet arthropathy bilaterally. Upper chest: No acute abnormality. Other: None. IMPRESSION: 1. No acute intracranial process. 2. Mild degenerative changes in the cervical spine without evidence of acute fracture. Electronically Signed   By: Brett Fairy M.D.   On: 01/10/2023 21:50   CT Cervical Spine Wo Contrast  Result Date: 01/10/2023 CLINICAL DATA:  Fall.  Head and neck trauma, abnormal mental status. EXAM: CT HEAD WITHOUT CONTRAST CT CERVICAL SPINE WITHOUT CONTRAST TECHNIQUE: Multidetector CT imaging of the head and cervical spine was performed following the standard protocol without intravenous contrast. Multiplanar CT image reconstructions of the cervical spine were also generated. RADIATION DOSE REDUCTION: This exam was performed according to the departmental dose-optimization program which includes automated exposure control, adjustment of the mA and/or kV according to patient size and/or use of iterative reconstruction technique. COMPARISON:  12/30/2022. FINDINGS: CT HEAD FINDINGS Brain: No acute intracranial hemorrhage, midline shift or mass effect. No extra-axial fluid collection. Mild diffuse atrophy is noted. Gray-white matter differentiation is within normal limits. No hydrocephalus. Vascular: No hyperdense vessel or unexpected calcification. Skull: Normal. Negative for fracture or focal lesion. Sinuses/Orbits: No acute finding. Other: None. CT CERVICAL SPINE FINDINGS Alignment: There is mild stairstep  retrolisthesis at C2-C3, C3-C4, and C4-C5. Skull base and vertebrae: No acute fracture. No primary bone lesion or focal pathologic process. Soft tissues and spinal canal: No prevertebral fluid or swelling. No visible canal hematoma. Disc levels: Intervertebral disc space narrowing and degenerative endplate changes, most pronounced at C6-C7. Mild facet arthropathy bilaterally. Upper chest: No acute abnormality. Other: None. IMPRESSION: 1. No acute intracranial process. 2. Mild degenerative changes in the cervical spine without evidence of acute fracture. Electronically Signed   By: Brett Fairy M.D.   On: 01/10/2023 21:50   DG Knee 2 Views Left  Result Date: 01/10/2023 CLINICAL DATA:  Fall with left knee pain. EXAM: LEFT KNEE - 1-2 VIEW COMPARISON:  12/30/2022. FINDINGS: No evidence of fracture, dislocation, or joint effusion. Enthesopathic changes are noted at the patella. Soft tissues are unremarkable. IMPRESSION: No acute fracture or dislocation. Electronically Signed   By: Brett Fairy M.D.   On: 01/10/2023 21:18    Procedures Procedures    Medications Ordered in ED Medications  promethazine (PHENERGAN) tablet 12.5 mg (12.5 mg Oral Given 01/10/23 2102)  ketorolac (TORADOL) 15 MG/ML injection 15 mg (15 mg Intramuscular Given 01/10/23 2246)  ED Course/ Medical Decision Making/ A&P                             Medical Decision Making This patient presents to the ED for concern of fall and alcohol intoxication, this involves an extensive number of treatment options, and is a complaint that carries with it a high risk of complications and morbidity.  The differential diagnosis includes sure, intracranial hemorrhage, concussion, alcohol withdrawal, intoxication, other   Co morbidities that complicate the patient evaluation  Alcohol use disorder   Additional history obtained:  Additional history obtained from EMR External records from outside source obtained and reviewed including numerous  prior ED visits    Imaging Studies ordered:  I ordered imaging studies including CT head and Cspine, left knee Xray  I independently visualized and interpreted imaging which showed intracranial hemorrhage, no C-spine fracture, no fracture or dislocation left knee I agree with the radiologist interpretation       Problem List / ED Course / Critical interventions / Medication management  37 year old male presents the ED stating he wants resources to help him stop drinking also had fallen today and complaining of primarily left knee pain.  He is able to bear weight, has normal range of motion and strength in bilateral upper and lower extremities. He is not able to ambulate with steady gait due to his alcohol intoxication at this point.  I attempted to call his brother to get MRI at home as his imaging was reassuring and he is tolerating p.o. but his brother did not answer the phone.  Will check once he has been able to metabolize his alcohol, signed out to oncoming shift I ordered medication including phenergan   for nausea  Reevaluation of the patient after these medicines showed that the patient improved I have reviewed the patients home medicines and have made adjustments as needed   Social Determinants of Health:  Alcohol use disorder   Test / Admission - Considered:  This letter and labs the patient is well-appearing, no signs of alcohol withdrawal, tolerating p.o. after the Phenergan.  Abdomen soft nontender, nontoxic in appearance, do not feel labs are indicated at this time.     Amount and/or Complexity of Data Reviewed Radiology: ordered.  Risk Prescription drug management.           Final Clinical Impression(s) / ED Diagnoses Final diagnoses:  None    Rx / DC Orders ED Discharge Orders     None         Darci Current 01/10/23 2342    Drenda Freeze, MD 01/13/23 1309

## 2023-01-10 NOTE — ED Triage Notes (Signed)
Patient arrives via ems from [facility] secondary to fall. Patient denies any head trauma,no thinners.pt  c/o pain all over. Hx seizures and bipolar and alchool abuse.  Patient states he drunk 3 bottle of wine.

## 2023-01-10 NOTE — Discharge Instructions (Signed)
You were seen today after a fall. Your imaging was reassuring. He needs Tylenol or ibuprofen over-the-counter as needed for pain. Since you want resources to help stop drinking I advise you go to the behavioral health urgent care for further resources and you are given some other resources on your paperwork.  If you have any new or worsening symptoms come back to the ER

## 2023-01-15 ENCOUNTER — Encounter (HOSPITAL_COMMUNITY): Payer: Self-pay | Admitting: Emergency Medicine

## 2023-01-15 ENCOUNTER — Emergency Department (HOSPITAL_COMMUNITY): Payer: Medicaid Other

## 2023-01-15 ENCOUNTER — Other Ambulatory Visit: Payer: Self-pay

## 2023-01-15 ENCOUNTER — Encounter (HOSPITAL_COMMUNITY): Payer: Self-pay

## 2023-01-15 ENCOUNTER — Emergency Department (HOSPITAL_COMMUNITY)
Admission: EM | Admit: 2023-01-15 | Discharge: 2023-01-16 | Disposition: A | Discharge status: Home / Self Care | Payer: Medicaid Other | Source: Home / Self Care | Attending: Emergency Medicine | Admitting: Emergency Medicine

## 2023-01-15 ENCOUNTER — Emergency Department (HOSPITAL_COMMUNITY)
Admission: EM | Admit: 2023-01-15 | Discharge: 2023-01-15 | Disposition: A | Discharge status: Home / Self Care | Payer: Medicaid Other | Attending: Emergency Medicine | Admitting: Emergency Medicine

## 2023-01-15 DIAGNOSIS — S0083XA Contusion of other part of head, initial encounter: Secondary | ICD-10-CM | POA: Insufficient documentation

## 2023-01-15 DIAGNOSIS — T148XXA Other injury of unspecified body region, initial encounter: Secondary | ICD-10-CM

## 2023-01-15 DIAGNOSIS — W19XXXA Unspecified fall, initial encounter: Secondary | ICD-10-CM | POA: Insufficient documentation

## 2023-01-15 DIAGNOSIS — F10920 Alcohol use, unspecified with intoxication, uncomplicated: Secondary | ICD-10-CM

## 2023-01-15 DIAGNOSIS — F10120 Alcohol abuse with intoxication, uncomplicated: Secondary | ICD-10-CM | POA: Diagnosis present

## 2023-01-15 MED ORDER — THIAMINE MONONITRATE 100 MG PO TABS: 100.0000 mg | ORAL_TABLET | Freq: Every day | ORAL | Status: DC

## 2023-01-15 NOTE — ED Notes (Signed)
Sandwich and water provided to  the pt.  

## 2023-01-15 NOTE — ED Provider Triage Note (Signed)
Emergency Medicine Provider Triage Evaluation Note  AGIM KINGCADE , a 37 y.o. male  was evaluated in triage.  Pt complains of his "body hurting from alcoholism". Had a mechanical fall PTA. Struck left side of head on ground. Unknown LOC. Also c/o L shoulder pain. Was drinking ETOH up until the point of EMS transfer, per patient.  Review of Systems  Positive: As above Negative: As above  Physical Exam  BP (!) 112/94 (BP Location: Right Arm)   Pulse 98   Temp 98.5 F (36.9 C) (Oral)   Resp 18   SpO2 100%  Gen:   Awake, no distress   Resp:  Normal effort  MSK:   Moves extremities without difficulty  Other:  Smells of ETOH. Hematoma to L eyebrow. C-collar in place. Grips equal.  Medical Decision Making  Medically screening exam initiated at 10:30 PM.  Appropriate orders placed.  AARION EDENFIELD was informed that the remainder of the evaluation will be completed by another provider, this initial triage assessment does not replace that evaluation, and the importance of remaining in the ED until their evaluation is complete.  Mechanical fall w/ETOH on board - imaging ordered. Tdap UTD since 12/04/20.   Antonietta Breach, PA-C 01/15/23 2234

## 2023-01-15 NOTE — Discharge Instructions (Addendum)
Resource guide attached if you want help with detox.

## 2023-01-15 NOTE — ED Provider Notes (Signed)
[facility] Provider Note   CSN: [id number] Arrival date & time: 01/15/23  0110     History  Chief Complaint  Patient presents with   Alcohol Intoxication    Gary [name] is a 37 y.o. male.  The history is provided by the patient and medical records.  Alcohol Intoxication   37 y.o. M with history of alcohol abuse, presenting to the ED acutely intoxicated.  Apparently he was found in the park heavily intoxicated, [employer] called [facility] to transport to the ED.  He is requesting detox.    Well known to the ED for similar, 27 visits in the past 6 months.  Home Medications Prior to Admission medications   Medication Sig Start Date End Date Taking? Authorizing Provider  bictegravir-emtricitabine-tenofovir AF (BIKTARVY) 50-200-25 MG TABS tablet Take 1 tablet by mouth daily. 12/25/22 01/24/23  Truman Hayward, MD  folic acid (FOLVITE) 1 MG tablet Take 1 tablet (1 mg total) by mouth daily. 10/23/22   Shelly Coss, MD  levETIRAcetam (KEPPRA) 500 MG tablet Take 1 tablet (500 mg total) by mouth 2 (two) times daily. 12/27/22   Nita Sells, MD  thiamine (VITAMIN B1) 100 MG tablet Take 1 tablet (100 mg total) by mouth daily. 10/23/22   Shelly Coss, MD  famotidine (PEPCID) 20 MG tablet Take 1 tablet (20 mg total) by mouth 2 (two) times daily. Patient not taking: Reported on 06/01/2019 05/14/19 06/02/19  Davonna Belling, MD      Allergies    Tegretol [carbamazepine] and Caffeine    Review of Systems   Review of Systems  Constitutional:        Alcohol intoxication  All other systems reviewed and are negative.   Physical Exam Updated Vital Signs BP 113/67   Pulse 97   Resp 16   Ht 6' (1.829 m)   Wt 65.3 kg   SpO2 92%   BMI 19.53 kg/m   Physical Exam Vitals and nursing note reviewed.  Constitutional:      Appearance: He is well-developed.     Comments: Appears intoxicated, asking for sandwich  HENT:     Head:  Normocephalic and atraumatic.  Eyes:     Conjunctiva/sclera: Conjunctivae normal.     Pupils: Pupils are equal, round, and reactive to light.  Cardiovascular:     Rate and Rhythm: Normal rate and regular rhythm.     Heart sounds: Normal heart sounds.  Pulmonary:     Effort: Pulmonary effort is normal.     Breath sounds: Normal breath sounds.  Abdominal:     General: Bowel sounds are normal.     Palpations: Abdomen is soft.  Musculoskeletal:        General: Normal range of motion.     Cervical back: Normal range of motion.     Comments: Able to flex/extend both knee, no acute deformities noted  Skin:    General: Skin is warm and dry.  Neurological:     Mental Status: He is alert and oriented to person, place, and time.     ED Results / Procedures / Treatments   Labs (all labs ordered are listed, but only abnormal results are displayed) Labs Reviewed - No data to display  EKG None  Radiology No results found.  Procedures Procedures    Medications Ordered in ED Medications - No data to display  ED Course/ Medical Decision Making/ A&P  Medical Decision Making  37 y.o. M here acutely intoxicated-- found with open container in park, given citation and [employer] called [facility] to transport here.  He appears at his baseline.  He was given food and drink, allowed to metabolize.  I do not feel he requires urgent work-up.  He expressed wanting detox, given OP resources for this.  Stable for discharge.  Given bus pass.  Can return here for new concerns.  Final Clinical Impression(s) / ED Diagnoses Final diagnoses:  Alcoholic intoxication without complication [facility])    Rx / DC Orders ED Discharge Orders     None         Larene Pickett, PA-C 01/15/23 0554    Ezequiel Essex, MD 01/15/23 [phone]

## 2023-01-15 NOTE — ED Notes (Signed)
Pt provided with urinal per request.

## 2023-01-15 NOTE — ED Triage Notes (Signed)
Per EMS, [place], c/o fall faceforward to concrete.  Left side of face has a golf ball size hematoma & left shoulder pain.  Is in C-collar b/c initially c/o neck pain however no more complaints of neck pain.  Had "less than 2 5th of vodka".    114/74 HR 100 100% RA CBG 289

## 2023-01-15 NOTE — ED Triage Notes (Signed)
Arrives via [place] after police found him intoxicated at the park again.   Cracking "your momma" jokes.   Also c/o bilateral knee pain of many year.

## 2023-01-16 MED ORDER — CHLORDIAZEPOXIDE HCL 25 MG PO CAPS
50.0000 mg | ORAL_CAPSULE | Freq: Once | ORAL | Status: AC
  Administered 2023-01-16: 50 mg via ORAL
  Filled 2023-01-16: qty 2

## 2023-01-16 MED ORDER — ACETAMINOPHEN 500 MG PO TABS
1000.0000 mg | ORAL_TABLET | Freq: Once | ORAL | Status: AC
  Administered 2023-01-16: 1000 mg via ORAL
  Filled 2023-01-16: qty 2

## 2023-01-16 NOTE — ED Provider Notes (Signed)
[facility] Provider Note   CSN: [id number] Arrival date & time: 01/15/23  2132     History {Add pertinent medical, surgical, social history, OB history to HPI:1} Chief Complaint  Patient presents with  . Fall    Gary [name] is a 37 y.o. male.  37 yo M here with a reported fall and facial trauma. States he has been drinking some but not a lot. Denies injury elsewhere.    Fall      Home Medications Prior to Admission medications   Medication Sig Start Date End Date Taking? Authorizing Provider  bictegravir-emtricitabine-tenofovir AF (BIKTARVY) 50-200-25 MG TABS tablet Take 1 tablet by mouth daily. 12/25/22 01/24/23  Truman Hayward, MD  folic acid (FOLVITE) 1 MG tablet Take 1 tablet (1 mg total) by mouth daily. 10/23/22   Shelly Coss, MD  levETIRAcetam (KEPPRA) 500 MG tablet Take 1 tablet (500 mg total) by mouth 2 (two) times daily. 12/27/22   Nita Sells, MD  thiamine (VITAMIN B1) 100 MG tablet Take 1 tablet (100 mg total) by mouth daily. 10/23/22   Shelly Coss, MD  famotidine (PEPCID) 20 MG tablet Take 1 tablet (20 mg total) by mouth 2 (two) times daily. Patient not taking: Reported on 06/01/2019 05/14/19 06/02/19  Davonna Belling, MD      Allergies    Tegretol [carbamazepine] and Caffeine    Review of Systems   Review of Systems  Physical Exam Updated Vital Signs BP 113/84 (BP Location: Right Arm)   Pulse 86   Temp 98.6 F (37 C) (Oral)   Resp 18   SpO2 96%  Physical Exam Vitals and nursing note reviewed.  Constitutional:      Appearance: He is well-developed.  HENT:     Head: Normocephalic.     Comments: Hematomas around the lower left forehead contusion and abrasion.  Extraocular movements intact. Can open jaw. Can talk.  Eyes:     Pupils: Pupils are equal, round, and reactive to light.  Cardiovascular:     Rate and Rhythm: Normal rate.  Pulmonary:     Effort: Pulmonary effort is normal.  No respiratory distress.  Abdominal:     General: Abdomen is flat. There is no distension.  Musculoskeletal:        General: Normal range of motion.     Cervical back: Normal range of motion.  Skin:    General: Skin is warm and dry.  Neurological:     General: No focal deficit present.     Mental Status: He is alert.    ED Results / Procedures / Treatments   Labs (all labs ordered are listed, but only abnormal results are displayed) Labs Reviewed - No data to display  EKG None  Radiology CT HEAD WO CONTRAST (5MM)  Result Date: 01/16/2023 CLINICAL DATA:  Neck trauma, intoxicated or obtunded (Age >= 16y); Head trauma, moderate-severe; Facial trauma, blunt EXAM: CT HEAD WITHOUT CONTRAST CT MAXILLOFACIAL WITHOUT CONTRAST CT CERVICAL SPINE WITHOUT CONTRAST TECHNIQUE: Multidetector CT imaging of the head, cervical spine, and maxillofacial structures were performed using the standard protocol without intravenous contrast. Multiplanar CT image reconstructions of the cervical spine and maxillofacial structures were also generated. RADIATION DOSE REDUCTION: This exam was performed according to the departmental dose-optimization program which includes automated exposure control, adjustment of the mA and/or kV according to patient size and/or use of iterative reconstruction technique. COMPARISON:  CT head and C-spine 01/10/2023 FINDINGS: CT HEAD FINDINGS Brain: No  evidence of large-territorial acute infarction. No parenchymal hemorrhage. No mass lesion. No extra-axial collection. No mass effect or midline shift. No hydrocephalus. Basilar cisterns are patent. Vascular: No hyperdense vessel. Skull: No acute fracture or focal lesion. Other: None. CT MAXILLOFACIAL FINDINGS Osseous: No fracture or mandibular dislocation. No destructive process. Sinuses/Orbits: Paranasal sinuses and mastoid air cells are clear. The orbits are unremarkable. Soft tissues: Left periorbital soft tissue defect with associated small  hematoma formation. CT CERVICAL SPINE FINDINGS Alignment: Stable mild retrolisthesis C3 on C4, C4 on C5, C5 on C6, C6 on C7. Skull base and vertebrae: No acute fracture. No aggressive appearing focal osseous lesion or focal pathologic process. Soft tissues and spinal canal: No prevertebral fluid or swelling. No visible canal hematoma. Upper chest: Unremarkable. Other: None. IMPRESSION: 1. No acute intracranial abnormality. 2. No acute displaced facial fracture. 3. No acute displaced fracture or traumatic listhesis of the cervical spine. Electronically Signed   By: Iven Finn M.D.   On: 01/16/2023 00:56   CT Maxillofacial Wo Contrast  Result Date: 01/16/2023 CLINICAL DATA:  Neck trauma, intoxicated or obtunded (Age >= 16y); Head trauma, moderate-severe; Facial trauma, blunt EXAM: CT HEAD WITHOUT CONTRAST CT MAXILLOFACIAL WITHOUT CONTRAST CT CERVICAL SPINE WITHOUT CONTRAST TECHNIQUE: Multidetector CT imaging of the head, cervical spine, and maxillofacial structures were performed using the standard protocol without intravenous contrast. Multiplanar CT image reconstructions of the cervical spine and maxillofacial structures were also generated. RADIATION DOSE REDUCTION: This exam was performed according to the departmental dose-optimization program which includes automated exposure control, adjustment of the mA and/or kV according to patient size and/or use of iterative reconstruction technique. COMPARISON:  CT head and C-spine 01/10/2023 FINDINGS: CT HEAD FINDINGS Brain: No evidence of large-territorial acute infarction. No parenchymal hemorrhage. No mass lesion. No extra-axial collection. No mass effect or midline shift. No hydrocephalus. Basilar cisterns are patent. Vascular: No hyperdense vessel. Skull: No acute fracture or focal lesion. Other: None. CT MAXILLOFACIAL FINDINGS Osseous: No fracture or mandibular dislocation. No destructive process. Sinuses/Orbits: Paranasal sinuses and mastoid air cells are  clear. The orbits are unremarkable. Soft tissues: Left periorbital soft tissue defect with associated small hematoma formation. CT CERVICAL SPINE FINDINGS Alignment: Stable mild retrolisthesis C3 on C4, C4 on C5, C5 on C6, C6 on C7. Skull base and vertebrae: No acute fracture. No aggressive appearing focal osseous lesion or focal pathologic process. Soft tissues and spinal canal: No prevertebral fluid or swelling. No visible canal hematoma. Upper chest: Unremarkable. Other: None. IMPRESSION: 1. No acute intracranial abnormality. 2. No acute displaced facial fracture. 3. No acute displaced fracture or traumatic listhesis of the cervical spine. Electronically Signed   By: Iven Finn M.D.   On: 01/16/2023 00:56   CT Cervical Spine Wo Contrast  Result Date: 01/16/2023 CLINICAL DATA:  Neck trauma, intoxicated or obtunded (Age >= 16y); Head trauma, moderate-severe; Facial trauma, blunt EXAM: CT HEAD WITHOUT CONTRAST CT MAXILLOFACIAL WITHOUT CONTRAST CT CERVICAL SPINE WITHOUT CONTRAST TECHNIQUE: Multidetector CT imaging of the head, cervical spine, and maxillofacial structures were performed using the standard protocol without intravenous contrast. Multiplanar CT image reconstructions of the cervical spine and maxillofacial structures were also generated. RADIATION DOSE REDUCTION: This exam was performed according to the departmental dose-optimization program which includes automated exposure control, adjustment of the mA and/or kV according to patient size and/or use of iterative reconstruction technique. COMPARISON:  CT head and C-spine 01/10/2023 FINDINGS: CT HEAD FINDINGS Brain: No evidence of large-territorial acute infarction. No parenchymal hemorrhage. No mass lesion.  No extra-axial collection. No mass effect or midline shift. No hydrocephalus. Basilar cisterns are patent. Vascular: No hyperdense vessel. Skull: No acute fracture or focal lesion. Other: None. CT MAXILLOFACIAL FINDINGS Osseous: No fracture or  mandibular dislocation. No destructive process. Sinuses/Orbits: Paranasal sinuses and mastoid air cells are clear. The orbits are unremarkable. Soft tissues: Left periorbital soft tissue defect with associated small hematoma formation. CT CERVICAL SPINE FINDINGS Alignment: Stable mild retrolisthesis C3 on C4, C4 on C5, C5 on C6, C6 on C7. Skull base and vertebrae: No acute fracture. No aggressive appearing focal osseous lesion or focal pathologic process. Soft tissues and spinal canal: No prevertebral fluid or swelling. No visible canal hematoma. Upper chest: Unremarkable. Other: None. IMPRESSION: 1. No acute intracranial abnormality. 2. No acute displaced facial fracture. 3. No acute displaced fracture or traumatic listhesis of the cervical spine. Electronically Signed   By: Iven Finn M.D.   On: 01/16/2023 00:56   DG Shoulder Left  Result Date: 01/15/2023 CLINICAL DATA:  Fall EXAM: LEFT SHOULDER - 2+ VIEW COMPARISON:  Chest x-ray 12/22/2022 FINDINGS: There is stable elevation of the left hemidiaphragm. There is an acute fracture of the distal left clavicle with mild apex superior angulation. There is no dislocation. Soft tissues are within normal limits. IMPRESSION: Acute fracture of the distal left clavicle with mild apex superior angulation. Electronically Signed   By: Ronney Asters M.D.   On: 01/15/2023 23:26    Procedures Procedures    Medications Ordered in ED Medications  thiamine (VITAMIN B1) tablet 100 mg (has no administration in time range)  chlordiazePOXIDE (LIBRIUM) capsule 50 mg (50 mg Oral Given 01/16/23 0637)  acetaminophen (TYLENOL) tablet 1,000 mg (1,000 mg Oral Given 01/16/23 [id number])    ED Course/ Medical Decision Making/ A&P                             Medical Decision Making Risk OTC drugs. Prescription drug management.   ***  {Document critical care time when appropriate:1} {Document review of labs and clinical decision tools ie heart score, Chads2Vasc2 etc:1}   {Document your independent review of radiology images, and any outside records:1} {Document your discussion with family members, caretakers, and with consultants:1} {Document social determinants of health affecting pt's care:1} {Document your decision making why or why not admission, treatments were needed:1} Final Clinical Impression(s) / ED Diagnoses Final diagnoses:  Fall, initial encounter  Contusion of face, initial encounter  Abrasion    Rx / DC Orders ED Discharge Orders     None

## 2023-01-18 ENCOUNTER — Other Ambulatory Visit: Payer: Self-pay

## 2023-01-18 ENCOUNTER — Encounter (HOSPITAL_COMMUNITY): Payer: Self-pay

## 2023-01-18 ENCOUNTER — Emergency Department (HOSPITAL_COMMUNITY): Payer: Medicaid Other

## 2023-01-18 ENCOUNTER — Emergency Department (HOSPITAL_COMMUNITY)
Admission: EM | Admit: 2023-01-18 | Discharge: 2023-01-19 | Disposition: A | Discharge status: Home / Self Care | Payer: Medicaid Other | Attending: Emergency Medicine | Admitting: Emergency Medicine

## 2023-01-18 DIAGNOSIS — F10920 Alcohol use, unspecified with intoxication, uncomplicated: Secondary | ICD-10-CM

## 2023-01-18 DIAGNOSIS — Y908 Blood alcohol level of 240 mg/100 ml or more: Secondary | ICD-10-CM | POA: Insufficient documentation

## 2023-01-18 DIAGNOSIS — Z79899 Other long term (current) drug therapy: Secondary | ICD-10-CM | POA: Diagnosis not present

## 2023-01-18 DIAGNOSIS — R519 Headache, unspecified: Secondary | ICD-10-CM | POA: Diagnosis present

## 2023-01-18 DIAGNOSIS — F10129 Alcohol abuse with intoxication, unspecified: Secondary | ICD-10-CM | POA: Diagnosis not present

## 2023-01-18 DIAGNOSIS — Z5900 Homelessness unspecified: Secondary | ICD-10-CM | POA: Insufficient documentation

## 2023-01-18 LAB — CBC WITH DIFFERENTIAL/PLATELET
Abs Immature Granulocytes: 0 10*3/uL (ref 0.00–0.07)
Basophils Absolute: 0 10*3/uL (ref 0.0–0.1)
Basophils Relative: 1 %
Eosinophils Absolute: 0.1 10*3/uL (ref 0.0–0.5)
Eosinophils Relative: 3 %
HCT: 33.6 % — ABNORMAL LOW (ref 39.0–52.0)
Hemoglobin: 11.1 g/dL — ABNORMAL LOW (ref 13.0–17.0)
Immature Granulocytes: 0 %
Lymphocytes Relative: 49 %
Lymphs Abs: 1.3 10*3/uL (ref 0.7–4.0)
MCH: 32.6 pg (ref 26.0–34.0)
MCHC: 33 g/dL (ref 30.0–36.0)
MCV: 98.5 fL (ref 80.0–100.0)
Monocytes Absolute: 0.2 10*3/uL (ref 0.1–1.0)
Monocytes Relative: 8 %
Neutro Abs: 1 10*3/uL — ABNORMAL LOW (ref 1.7–7.7)
Neutrophils Relative %: 39 %
Platelets: 73 10*3/uL — ABNORMAL LOW (ref 150–400)
RBC: 3.41 MIL/uL — ABNORMAL LOW (ref 4.22–5.81)
RDW: 14.7 % (ref 11.5–15.5)
WBC: 2.6 10*3/uL — ABNORMAL LOW (ref 4.0–10.5)
nRBC: 0 % (ref 0.0–0.2)

## 2023-01-18 LAB — COMPREHENSIVE METABOLIC PANEL
ALT: 55 U/L — ABNORMAL HIGH (ref 0–44)
AST: 90 U/L — ABNORMAL HIGH (ref 15–41)
Albumin: 3.5 g/dL (ref 3.5–5.0)
Alkaline Phosphatase: 59 U/L (ref 38–126)
Anion gap: 10 (ref 5–15)
BUN: <5 mg/dL — ABNORMAL LOW (ref 6–20)
CO2: 25 mmol/L (ref 22–32)
Calcium: 8 mg/dL — ABNORMAL LOW (ref 8.9–10.3)
Chloride: 100 mmol/L (ref 98–111)
Creatinine, Ser: 0.53 mg/dL — ABNORMAL LOW (ref 0.61–1.24)
GFR, Estimated: >60 mL/min (ref >60)
Glucose, Bld: 91 mg/dL (ref 70–99)
Potassium: 3.6 mmol/L (ref 3.5–5.1)
Sodium: 135 mmol/L (ref 135–145)
Total Bilirubin: 0.6 mg/dL (ref 0.3–1.2)
Total Protein: 7 g/dL (ref 6.5–8.1)

## 2023-01-18 LAB — ETHANOL: Alcohol, Ethyl (B): 415 mg/dL (ref <10)

## 2023-01-18 LAB — ACETAMINOPHEN LEVEL: Acetaminophen (Tylenol), Serum: <10 ug/mL — ABNORMAL LOW (ref 10–30)

## 2023-01-18 LAB — SALICYLATE LEVEL: Salicylate Lvl: <7 mg/dL — ABNORMAL LOW (ref 7.0–30.0)

## 2023-01-18 MED ORDER — LORAZEPAM 0.5 MG PO TABS
0.5000 mg | ORAL_TABLET | Freq: Once | ORAL | Status: AC
  Administered 2023-01-19: 0.5 mg via ORAL
  Filled 2023-01-18: qty 1

## 2023-01-18 NOTE — ED Triage Notes (Signed)
Patient fell Friday, was told he has a concussion and left shoulder fracture. Not wearing the sling "because it hurts." Wants to get detox from alcohol. Last drink was 2 bottles of wine 2 hours ago.

## 2023-01-18 NOTE — ED Notes (Signed)
Labeled specimen cup given to pt for U/A collection per MD order. ENMiles 

## 2023-01-18 NOTE — ED Provider Triage Note (Signed)
Emergency Medicine Provider Triage Evaluation Note  Gary [name] , a 37 y.o. male  was evaluated in triage.  Pt complains of requesting alcohol detox.  Last use was today.  Drink 2 bottles of wine.  Was seen recently for a fall and left shoulder fracture.  Is not wearing his sling due to irritation.  Reports continued pain.  States is difficult for him to see out of his left eye since the fall.  Reports pain in the left eye as well.  Has history of seizures and states he has seizures often.  Does not know if he has hit his head since he was discharged.  Denies HI.  Reports passive SI  Review of Systems  Positive: As above Negative: As above  Physical Exam  BP 121/77   Pulse 100   Temp 98.7 F (37.1 C) (Oral)   Resp 16   Ht 6' (1.829 m)   Wt 65 kg   SpO2 96%   BMI 19.43 kg/m  Gen:   Awake, no distress   Resp:  Normal effort  MSK:   Moves extremities without difficulty  Other:  Bandage in place over left temple.  Sling around neck with left arm not in the sling.  Slurring speech  Medical Decision Making  Medically screening exam initiated at 5:29 PM.  Appropriate orders placed.  ADHVIK NANNA was informed that the remainder of the evaluation will be completed by another provider, this initial triage assessment does not replace that evaluation, and the importance of remaining in the ED until their evaluation is complete.     Roylene Reason, [place] 01/18/23 1730

## 2023-01-18 NOTE — ED Provider Notes (Signed)
[place] Provider Note   CSN: [id number] Arrival date & time: 01/18/23  1702     History Chief Complaint  Patient presents with   Alcohol Problem    Gary [name] is a 37 y.o. male. Patient presented to the ED for alcohol detox. He is unsure what day it is. States he may have drank some alcohol this morning but has not had anything to eat. Thinks he had a fall several days ago. Was seen recently in ED multiple times for similar concern but has not looked into detox programs outpatient. Currently homeless and stays at [facility].   Alcohol Problem Associated symptoms include headaches.       Home Medications Prior to Admission medications   Medication Sig Start Date End Date Taking? Authorizing Provider  chlordiazePOXIDE (LIBRIUM) 25 MG capsule Take 2 capsules (50 mg total) by mouth 3 (three) times daily for 1 day, THEN 1-2 capsules (25-50 mg total) 2 (two) times daily for 1 day, THEN 1-2 capsules (25-50 mg total) daily for 1 day. 01/19/23 01/22/23 Yes Marcello Fennel, PA-C  bictegravir-emtricitabine-tenofovir AF (BIKTARVY) 50-200-25 MG TABS tablet Take 1 tablet by mouth daily. 12/25/22 01/24/23  Truman Hayward, MD  folic acid (FOLVITE) 1 MG tablet Take 1 tablet (1 mg total) by mouth daily. 10/23/22   Shelly Coss, MD  levETIRAcetam (KEPPRA) 500 MG tablet Take 1 tablet (500 mg total) by mouth 2 (two) times daily. 12/27/22   Nita Sells, MD  thiamine (VITAMIN B1) 100 MG tablet Take 1 tablet (100 mg total) by mouth daily. 10/23/22   Shelly Coss, MD  famotidine (PEPCID) 20 MG tablet Take 1 tablet (20 mg total) by mouth 2 (two) times daily. Patient not taking: Reported on 06/01/2019 05/14/19 06/02/19  Davonna Belling, MD      Allergies    Tegretol [carbamazepine] and Caffeine    Review of Systems   Review of Systems  Neurological:  Positive for headaches.  All other systems reviewed and are negative.   Physical  Exam Updated Vital Signs BP (!) 123/92   Pulse 78   Temp 99 F (37.2 C) (Oral)   Resp 16   Ht 6' (1.829 m)   Wt 65 kg   SpO2 98%   BMI 19.43 kg/m  Physical Exam Vitals and nursing note reviewed.  Constitutional:      General: He is not in acute distress.    Appearance: Normal appearance. He is not ill-appearing.  HENT:     Head: Normocephalic.     Comments: Minimal head trauma with ecchymosis noted around the left eye Eyes:     General:        Right eye: No discharge.        Left eye: No discharge.     Conjunctiva/sclera: Conjunctivae normal.     Pupils: Pupils are equal, round, and reactive to light.  Cardiovascular:     Rate and Rhythm: Normal rate and regular rhythm.     Pulses: Normal pulses.     Heart sounds: Normal heart sounds.  Pulmonary:     Effort: Pulmonary effort is normal.     Breath sounds: Normal breath sounds.  Musculoskeletal:        General: No swelling. Normal range of motion.     Cervical back: Normal range of motion and neck supple.  Skin:    General: Skin is warm and dry.     Capillary Refill: Capillary refill takes less  than 2 seconds.  Neurological:     General: No focal deficit present.     Mental Status: He is alert.     ED Results / Procedures / Treatments   Labs (all labs ordered are listed, but only abnormal results are displayed) Labs Reviewed  COMPREHENSIVE METABOLIC PANEL - Abnormal; Notable for the following components:      Result Value   BUN <5 (*)    Creatinine, Ser 0.53 (*)    Calcium 8.0 (*)    AST 90 (*)    ALT 55 (*)    All other components within normal limits  ETHANOL - Abnormal; Notable for the following components:   Alcohol, Ethyl (B) 415 (*)    All other components within normal limits  RAPID URINE DRUG SCREEN, HOSP PERFORMED - Abnormal; Notable for the following components:   Benzodiazepines POSITIVE (*)    All other components within normal limits  CBC WITH DIFFERENTIAL/PLATELET - Abnormal; Notable for the  following components:   WBC 2.6 (*)    RBC 3.41 (*)    Hemoglobin 11.1 (*)    HCT 33.6 (*)    Platelets 73 (*)    Neutro Abs 1.0 (*)    All other components within normal limits  SALICYLATE LEVEL - Abnormal; Notable for the following components:   Salicylate Lvl [id number] (*)    All other components within normal limits  ACETAMINOPHEN LEVEL - Abnormal; Notable for the following components:   Acetaminophen (Tylenol), Serum <10 (*)    All other components within normal limits  PROTIME-INR    EKG None  Radiology CT Head Wo Contrast  Result Date: 01/18/2023 CLINICAL DATA:  Recent fall EXAM: CT HEAD WITHOUT CONTRAST TECHNIQUE: Contiguous axial images were obtained from the base of the skull through the vertex without intravenous contrast. RADIATION DOSE REDUCTION: This exam was performed according to the departmental dose-optimization program which includes automated exposure control, adjustment of the mA and/or kV according to patient size and/or use of iterative reconstruction technique. COMPARISON:  01/16/2023 FINDINGS: Brain: No evidence of acute infarction, hemorrhage, mass, mass effect, or midline shift. No hydrocephalus or extra-axial fluid collection. Vascular: No hyperdense vessel. Skull: Negative for fracture or focal lesion. Small left frontal scalp/periorbital hematoma. Sinuses/Orbits: Minimal mucosal thickening in the left anterior ethmoid air cells. No acute finding in the orbits. Other: The mastoid air cells are well aerated. IMPRESSION: No acute intracranial process. Electronically Signed   By: Merilyn Baba M.D.   On: 01/18/2023 20:05    Procedures Procedures   Medications Ordered in ED Medications  LORazepam (ATIVAN) tablet 0.5 mg (0.5 mg Oral Given 01/19/23 0043)    ED Course/ Medical Decision Making/ A&P                           Medical Decision Making Amount and/or Complexity of Data Reviewed Labs: ordered.  Risk Prescription drug management.   This patient  presents to the ED for concern of alcohol intoxication. Differential diagnosis includes Warnicke's encephalopathy, acute alcohol intoxication, head trauma, syncope   Lab Tests:  I Ordered, and personally interpreted labs.  The pertinent results include: CBC and CMP at patient's baseline, salicylate level and acetaminophen negative, pro time INR normal, UDS positive for benzodiazepines, ethanol significantly elevated at 415  Imaging Studies ordered:  I ordered imaging studies including CT head I independently visualized and interpreted imaging which showed no acute cranial abnormalities I agree with the radiologist interpretation  Problem List / ED Course:  Patient presents emergency department with concerns of alcohol intoxication.  Patient frequently arrived to the emergency department similar complaints and requires some level of detox before discharge.  Patient appears to have had a fall several days ago with some head trauma noted with left eye ecchymosis.  CT head was performed which is largely reassuring at this time and patient will likely benefit from a brief restart here in the emergency department for acute alcohol intoxication and detox.  Will likely will discharge home if no acute complications arise. 9:49 PM Care of Armanie Dossett transferred to [facility] and Dr. Christy Gentles at the end of my shift as the patient will require reassessment once labs/imaging have resulted. Patient presentation, ED course, and plan of care discussed with review of all pertinent labs and imaging. Please see his/her note for further details regarding further ED course and disposition. Plan at time of handoff is to allow for monitored detox and evaluate for any signs of acute alcohol withdrawal. Patient will likely be able to discharge home briefly. This may be altered or completely changed at the discretion of the oncoming team pending results of further workup.    Final Clinical Impression(s) / ED  Diagnoses Final diagnoses:  Alcoholic intoxication without complication ([place])    Rx / DC Orders ED Discharge Orders          Ordered    chlordiazePOXIDE (LIBRIUM) 25 MG capsule  Multiple Frequencies        01/19/23 0624              Luvenia Heller, PA-C 01/19/23 2150    Jeanell Sparrow, DO 01/20/23 1510

## 2023-01-19 ENCOUNTER — Other Ambulatory Visit (HOSPITAL_COMMUNITY): Payer: Self-pay

## 2023-01-19 LAB — RAPID URINE DRUG SCREEN, HOSP PERFORMED
Amphetamines: NOT DETECTED
Barbiturates: NOT DETECTED
Benzodiazepines: POSITIVE — AB
Cocaine: NOT DETECTED
Opiates: NOT DETECTED
Tetrahydrocannabinol: NOT DETECTED

## 2023-01-19 LAB — PROTIME-INR
INR: 1.1 (ref 0.8–1.2)
Prothrombin Time: 13.8 seconds (ref 11.4–15.2)

## 2023-01-19 MED ORDER — CHLORDIAZEPOXIDE HCL 25 MG PO CAPS
ORAL_CAPSULE | ORAL | 0 refills | Status: AC
  Filled 2023-01-19: qty 12, 3d supply, fill #0

## 2023-01-19 NOTE — ED Notes (Signed)
Cleaned forehead as able due to pain and covered with bandaid. Provided extra bandaids and bus pass.

## 2023-01-19 NOTE — Discharge Instructions (Signed)
Alcohol intoxication-if you need help with alcohol have given you outpatient resources please review.  Of also given you Librium please take as prescribed do not mix with alcohol as it can lower your respiratory drive continue to cardiac arrest. Left clavicle fracture-please continue to sling as provided, follow-up with orthopedics for further evaluation  Come back to the emergency department if you develop chest pain, shortness of breath, severe abdominal pain, uncontrolled nausea, vomiting, diarrhea.

## 2023-01-19 NOTE — ED Provider Notes (Signed)
Received at shift change from [facility] please see his note for full detail  In short patient with medical history including alcohol dependency, seizures, schizophrenia, pancreatitis, bipolar, HIV presenting with alcohol intoxication he has no other complaints, endorsing any head trauma or any other trauma, no endorsing chest pain shortness of breath stomach pains nausea vomiting diarrhea endorse any suicidal or homicidal ideations.  Per previous provider likely discharge upon metabolizing to baseline.   Physical Exam  BP (!) 123/92   Pulse 78   Temp 99 F (37.2 C) (Oral)   Resp 16   Ht 6' (1.829 m)   Wt 65 kg   SpO2 98%   BMI 19.43 kg/m   Physical Exam Vitals and nursing note reviewed.  Constitutional:      General: He is not in acute distress.    Appearance: He is not ill-appearing.  HENT:     Head: Normocephalic and atraumatic.     Comments: Patient has a noted abrasion to his right temple, currently in a bandage, edema around the left periorbital region, no other gross deformities present.    Nose: No congestion.     Mouth/Throat:     Mouth: Mucous membranes are moist.     Pharynx: Oropharynx is clear. No oropharyngeal exudate or posterior oropharyngeal erythema.     Comments: No trismus no torticollis no oral trauma present. Eyes:     Extraocular Movements: Extraocular movements intact.     Conjunctiva/sclera: Conjunctivae normal.     Pupils: Pupils are equal, round, and reactive to light.     Comments: Periorbital edema around the left eye, slight scleral injection of the left eye, EOMs intact, PERRLA, no blood noted anterior chamber the eye.  Cardiovascular:     Rate and Rhythm: Normal rate and regular rhythm.     Pulses: Normal pulses.  Pulmonary:     Effort: Pulmonary effort is normal.  Abdominal:     Palpations: Abdomen is soft.     Tenderness: There is no abdominal tenderness.  Musculoskeletal:     Comments: Patient is moving his upper and lower  extremities out difficulty.  Skin:    General: Skin is warm and dry.  Neurological:     Mental Status: He is alert.     Comments: No facial asymmetry no difficulty with word finding following two-step commands there is no unilateral weakness present.  Psychiatric:        Mood and Affect: Mood normal.     Comments: Does not endorse suicidal Holmes ideations, does not appear to be respond to internal stimuli, does not appear to be actively withdrawing.     Procedures  Procedures  ED Course / MDM    Medical Decision Making Amount and/or Complexity of Data Reviewed Labs: ordered.  Risk Prescription drug management.     Lab Tests:  I Ordered, and personally interpreted labs.  The pertinent results include: CBC shows leukocytopenia with a white count 2.6, normocytic anemia he will 11.1, platelets 73, CMP reveals BUN of less than 5 AST 90 ALT 55, INR prothrombin time unremarkable, ethanol 415, rapid reduction positive for benzodiazepine   Imaging Studies ordered:  I ordered imaging studies including CT head I independently visualized and interpreted imaging which showed negative acute findings I agree with the radiologist interpretation   Cardiac Monitoring:  The patient was maintained on a cardiac monitor.  I personally viewed and interpreted the cardiac monitored which showed an underlying rhythm of: N/A   Medicines ordered and  prescription drug management:  I ordered medication including Ativan I have reviewed the patients home medicines and have made adjustments as needed  Critical Interventions:  N/A   Reevaluation:  On assessment patient no complaints, states that he just needs a place to sleep, will provide him with some Ativan he states he feels anxious and reassess   Patient slept in the ED for 6 hours, vital signs remained stable, he is tolerating p.o., he is agreement discharge at this time.  Consultations Obtained:  N/A    Test  Considered:  N/A    Rule out Suspicion for psychiatric emergency is low at this time does not endorse any suicidal Holmes ideations does not appear to be respond to internal stimuli.  I doubt acute withdrawals requiring hospitalization as he is nontremulous on my exam vital signs are reassuring.  I doubt patient is in acute liver failure as he is nonjaundiced appearing, does not have coagulopathy abnormalities, patient's had elevated elevated liver liver enzymes in the past as well as decreased white blood cells and platelets.  Suspect this is all secondary due to alcohol consumption.    Dispostion and problem list  After consideration of the diagnostic results and the patients response to treatment, I feel that the patent would benefit from discharge.  Alcohol intoxication-uncomplicated, patient is requesting help with help with withdrawals, reviewed [place] has not received Librium in over 3 months, will provide him with a dose pack, from with outpatient resources. Left clavicle fracture-patient is currently in a sling, will encourage him to follow-up with orthopedics for further evaluation. Head laceration-will recommend basic wound care follow-up with community health and wellness           Aron Baba 01/19/23 [id number]    Ripley Fraise, MD 01/19/23 [phone]

## 2023-01-20 ENCOUNTER — Emergency Department (HOSPITAL_COMMUNITY)
Admission: EM | Admit: 2023-01-20 | Discharge: 2023-01-21 | Disposition: A | Discharge status: Home / Self Care | Payer: Medicaid Other | Attending: Emergency Medicine | Admitting: Emergency Medicine

## 2023-01-20 ENCOUNTER — Encounter (HOSPITAL_COMMUNITY): Payer: Self-pay

## 2023-01-20 ENCOUNTER — Emergency Department (HOSPITAL_COMMUNITY): Payer: Medicaid Other

## 2023-01-20 DIAGNOSIS — D72818 Other decreased white blood cell count: Secondary | ICD-10-CM | POA: Insufficient documentation

## 2023-01-20 DIAGNOSIS — F10920 Alcohol use, unspecified with intoxication, uncomplicated: Secondary | ICD-10-CM

## 2023-01-20 DIAGNOSIS — R930 Abnormal findings on diagnostic imaging of skull and head, not elsewhere classified: Secondary | ICD-10-CM | POA: Insufficient documentation

## 2023-01-20 DIAGNOSIS — F10129 Alcohol abuse with intoxication, unspecified: Secondary | ICD-10-CM | POA: Diagnosis present

## 2023-01-20 DIAGNOSIS — Z21 Asymptomatic human immunodeficiency virus [HIV] infection status: Secondary | ICD-10-CM | POA: Diagnosis not present

## 2023-01-20 DIAGNOSIS — Y908 Blood alcohol level of 240 mg/100 ml or more: Secondary | ICD-10-CM | POA: Diagnosis not present

## 2023-01-20 LAB — COMPREHENSIVE METABOLIC PANEL
ALT: 75 U/L — ABNORMAL HIGH (ref 0–44)
AST: 132 U/L — ABNORMAL HIGH (ref 15–41)
Albumin: 3.8 g/dL (ref 3.5–5.0)
Alkaline Phosphatase: 73 U/L (ref 38–126)
Anion gap: 14 (ref 5–15)
BUN: 9 mg/dL (ref 6–20)
CO2: 21 mmol/L — ABNORMAL LOW (ref 22–32)
Calcium: 8.5 mg/dL — ABNORMAL LOW (ref 8.9–10.3)
Chloride: 104 mmol/L (ref 98–111)
Creatinine, Ser: 0.7 mg/dL (ref 0.61–1.24)
GFR, Estimated: >60 mL/min (ref >60)
Glucose, Bld: 108 mg/dL — ABNORMAL HIGH (ref 70–99)
Potassium: 3.8 mmol/L (ref 3.5–5.1)
Sodium: 139 mmol/L (ref 135–145)
Total Bilirubin: 0.2 mg/dL — ABNORMAL LOW (ref 0.3–1.2)
Total Protein: 7.1 g/dL (ref 6.5–8.1)

## 2023-01-20 LAB — CBC WITH DIFFERENTIAL/PLATELET
Abs Immature Granulocytes: 0 10*3/uL (ref 0.00–0.07)
Basophils Absolute: 0 10*3/uL (ref 0.0–0.1)
Basophils Relative: 1 %
Eosinophils Absolute: 0.1 10*3/uL (ref 0.0–0.5)
Eosinophils Relative: 2 %
HCT: 33.3 % — ABNORMAL LOW (ref 39.0–52.0)
Hemoglobin: 11.3 g/dL — ABNORMAL LOW (ref 13.0–17.0)
Immature Granulocytes: 0 %
Lymphocytes Relative: 41 %
Lymphs Abs: 1.2 10*3/uL (ref 0.7–4.0)
MCH: 33.2 pg (ref 26.0–34.0)
MCHC: 33.9 g/dL (ref 30.0–36.0)
MCV: 97.9 fL (ref 80.0–100.0)
Monocytes Absolute: 0.2 10*3/uL (ref 0.1–1.0)
Monocytes Relative: 7 %
Neutro Abs: 1.4 10*3/uL — ABNORMAL LOW (ref 1.7–7.7)
Neutrophils Relative %: 49 %
Platelets: 86 10*3/uL — ABNORMAL LOW (ref 150–400)
RBC: 3.4 MIL/uL — ABNORMAL LOW (ref 4.22–5.81)
RDW: 15.1 % (ref 11.5–15.5)
WBC: 2.8 10*3/uL — ABNORMAL LOW (ref 4.0–10.5)
nRBC: 0 % (ref 0.0–0.2)

## 2023-01-20 LAB — ETHANOL: Alcohol, Ethyl (B): 397 mg/dL (ref <10)

## 2023-01-20 NOTE — Discharge Instructions (Signed)
Return to emergency department for emergencies.

## 2023-01-20 NOTE — ED Provider Notes (Signed)
[place] Provider Note   CSN: [id number] Arrival date & time: 01/20/23  1650     History  Chief Complaint  Patient presents with   Alcohol Intoxication    Gary [name] is a 37 y.o. male.   Alcohol Intoxication     Patient with medical history of HIV, pancreatitis, alcohol use disorder presents to the emergency department due to fall.  Patient endorses management of alcohol use yesterday and the day before, endorses multiple falls. Denies losing consciousness.  Had a witnessed fall prior to arrival brought to the ED.  Home Medications Prior to Admission medications   Medication Sig Start Date End Date Taking? Authorizing Provider  bictegravir-emtricitabine-tenofovir AF (BIKTARVY) 50-200-25 MG TABS tablet Take 1 tablet by mouth daily. 12/25/22 01/24/23  Truman Hayward, MD  chlordiazePOXIDE (LIBRIUM) 25 MG capsule Take 2 capsules (50 mg total) by mouth 3 (three) times daily for 1 day, THEN 1-2 capsules (25-50 mg total) 2 (two) times daily for 1 day, THEN 1-2 capsules (25-50 mg total) daily for 1 day. 01/19/23 01/22/23  Marcello Fennel, PA-C  folic acid (FOLVITE) 1 MG tablet Take 1 tablet (1 mg total) by mouth daily. 10/23/22   Shelly Coss, MD  levETIRAcetam (KEPPRA) 500 MG tablet Take 1 tablet (500 mg total) by mouth 2 (two) times daily. 12/27/22   Nita Sells, MD  thiamine (VITAMIN B1) 100 MG tablet Take 1 tablet (100 mg total) by mouth daily. 10/23/22   Shelly Coss, MD  famotidine (PEPCID) 20 MG tablet Take 1 tablet (20 mg total) by mouth 2 (two) times daily. Patient not taking: Reported on 06/01/2019 05/14/19 06/02/19  Davonna Belling, MD      Allergies    Tegretol [carbamazepine] and Caffeine    Review of Systems   Review of Systems  Physical Exam Updated Vital Signs BP 119/78   Pulse 91   Temp 98.7 F (37.1 C)   Resp 18   SpO2 100%  Physical Exam Vitals and nursing note reviewed. Exam conducted  with a chaperone present.  Constitutional:      Appearance: Normal appearance.  HENT:     Head: Normocephalic.     Comments: Frontal scalp hematoma.  2 cm laceration superior to left eyebrow with granulation tissue. Eyes:     General: No scleral icterus.       Right eye: No discharge.        Left eye: No discharge.     Extraocular Movements: Extraocular movements intact.     Pupils: Pupils are equal, round, and reactive to light.     Comments: Subconjunctival hemorrhage left eye, no hyphema, no teardrop pupil.  Cardiovascular:     Rate and Rhythm: Normal rate and regular rhythm.     Pulses: Normal pulses.     Heart sounds: Normal heart sounds.     No friction rub. No gallop.  Pulmonary:     Effort: Pulmonary effort is normal. No respiratory distress.     Breath sounds: Normal breath sounds.  Abdominal:     General: Abdomen is flat. Bowel sounds are normal. There is no distension.     Palpations: Abdomen is soft.     Tenderness: There is no abdominal tenderness.  Musculoskeletal:     Cervical back: Normal range of motion. No tenderness.  Skin:    General: Skin is warm and dry.     Coloration: Skin is not jaundiced.     Comments: 2  cm laceration superior left eyebrow.  There is granulation tissue already starting to form  Neurological:     Mental Status: He is alert. Mental status is at baseline.     Coordination: Coordination normal.     ED Results / Procedures / Treatments   Labs (all labs ordered are listed, but only abnormal results are displayed) Labs Reviewed  CBC WITH DIFFERENTIAL/PLATELET - Abnormal; Notable for the following components:      Result Value   WBC 2.8 (*)    RBC 3.40 (*)    Hemoglobin 11.3 (*)    HCT 33.3 (*)    Platelets 86 (*)    Neutro Abs 1.4 (*)    All other components within normal limits  COMPREHENSIVE METABOLIC PANEL - Abnormal; Notable for the following components:   CO2 21 (*)    Glucose, Bld 108 (*)    Calcium 8.5 (*)    AST 132 (*)     ALT 75 (*)    Total Bilirubin 0.2 (*)    All other components within normal limits  ETHANOL - Abnormal; Notable for the following components:   Alcohol, Ethyl (B) 397 (*)    All other components within normal limits    EKG None  Radiology CT Head Wo Contrast  Result Date: 01/20/2023 CLINICAL DATA:  Trauma EXAM: CT HEAD WITHOUT CONTRAST CT CERVICAL SPINE WITHOUT CONTRAST TECHNIQUE: Multidetector CT imaging of the head and cervical spine was performed following the standard protocol without intravenous contrast. Multiplanar CT image reconstructions of the cervical spine were also generated. RADIATION DOSE REDUCTION: This exam was performed according to the departmental dose-optimization program which includes automated exposure control, adjustment of the mA and/or kV according to patient size and/or use of iterative reconstruction technique. COMPARISON:  CT C Spine 01/10/23 FINDINGS: CT HEAD FINDINGS Brain: No evidence of acute infarction, hemorrhage, hydrocephalus, extra-axial collection or mass lesion/mass effect. Vascular: No hyperdense vessel or unexpected calcification. Skull: 2 small soft tissue hematoma along the left frontal scalp (series 4, image 58). No evidence of underlying calvarial fracture. Sinuses/Orbits: No middle ear or mastoid effusion. Cerumen in bilateral EACs. Paranasal sinuses are clear. Orbits are unremarkable. Other: None. CT CERVICAL SPINE FINDINGS Alignment: Trace retrolisthesis of C3 on C4, C4 on C5, C5 on C6, and C6 on C7. Is unchanged from prior exam. Skull base and vertebrae: Redemonstrated is mild height loss at the anterior superior endplate of C5, unchanged from multiple priors. Soft tissues and spinal canal: No prevertebral fluid or swelling. No visible canal hematoma. Disc levels:  No evidence of high-grade spinal canal stenosis. Upper chest: Negative. Other: None IMPRESSION: 1. No acute intracranial abnormality. 2. Small soft tissue hematoma along the left frontal  scalp. No evidence of underlying calvarial fracture. 3. No acute fracture or traumatic malalignment of the cervical spine. Electronically Signed   By: Marin Roberts M.D.   On: 01/20/2023 17:58   CT Cervical Spine Wo Contrast  Result Date: 01/20/2023 CLINICAL DATA:  Trauma EXAM: CT HEAD WITHOUT CONTRAST CT CERVICAL SPINE WITHOUT CONTRAST TECHNIQUE: Multidetector CT imaging of the head and cervical spine was performed following the standard protocol without intravenous contrast. Multiplanar CT image reconstructions of the cervical spine were also generated. RADIATION DOSE REDUCTION: This exam was performed according to the departmental dose-optimization program which includes automated exposure control, adjustment of the mA and/or kV according to patient size and/or use of iterative reconstruction technique. COMPARISON:  CT C Spine 01/10/23 FINDINGS: CT HEAD FINDINGS Brain: No evidence of  acute infarction, hemorrhage, hydrocephalus, extra-axial collection or mass lesion/mass effect. Vascular: No hyperdense vessel or unexpected calcification. Skull: 2 small soft tissue hematoma along the left frontal scalp (series 4, image 58). No evidence of underlying calvarial fracture. Sinuses/Orbits: No middle ear or mastoid effusion. Cerumen in bilateral EACs. Paranasal sinuses are clear. Orbits are unremarkable. Other: None. CT CERVICAL SPINE FINDINGS Alignment: Trace retrolisthesis of C3 on C4, C4 on C5, C5 on C6, and C6 on C7. Is unchanged from prior exam. Skull base and vertebrae: Redemonstrated is mild height loss at the anterior superior endplate of C5, unchanged from multiple priors. Soft tissues and spinal canal: No prevertebral fluid or swelling. No visible canal hematoma. Disc levels:  No evidence of high-grade spinal canal stenosis. Upper chest: Negative. Other: None IMPRESSION: 1. No acute intracranial abnormality. 2. Small soft tissue hematoma along the left frontal scalp. No evidence of underlying calvarial  fracture. 3. No acute fracture or traumatic malalignment of the cervical spine. Electronically Signed   By: Marin Roberts M.D.   On: 01/20/2023 17:58    Procedures Procedures    Medications Ordered in ED Medications - No data to display  ED Course/ Medical Decision Making/ A&P Clinical Course as of 01/20/23 2309  Wed Jan 20, 2023  1917 CT Head Wo Contrast [HS]    Clinical Course User Index [HS] Sherrill Raring, PA-C                             Medical Decision Making Amount and/or Complexity of Data Reviewed Radiology:  Decision-making details documented in ED Course.   This is a 20 old male presenting to the emergency department due to alcohol intoxication/fall.  Patient states he was drinking copious amounts of alcohol, not clarifying when.  He had a witnessed fall prior to arrival and brought by EMS for further evaluation.  He has no tenderness to the cervical spine, moving all extremities without any difficulty.  No signs of orbital trauma or ruptured globe.  CT head was ordered as well as cervical spine in triage given mechanism and intoxication, negative for acute process other than frontal scalp hematoma.  I reviewed external medical records including previous ED visit to see if there is any documented laceration, there is not.  Patient's tetanus is up-to-date per chart review.  I considered closing the wound but unclear when it first occurred, additionally there is granulation tissue so not amenable to suture laceration.  I did closed loosely with Steri-Strips.  Laboratory workup ordered in triage, no gross electrolyte derangement, patient is leukopenic and thrombocytopenic but roughly baseline per chart review.  Patient asking for food which I provided.  Discussed with patient that we do not do detox in the emergency department, requesting to go home if we are not "going to help him get sober".  Discussed with patient that until he is able to ambulate and clinically sober he can  leave.          Final Clinical Impression(s) / ED Diagnoses Final diagnoses:  Alcoholic intoxication without complication [facility])    Rx / DC Orders ED Discharge Orders     None         Sherrill Raring, PA-C 01/20/23 2309    Tretha Sciara, MD 01/21/23 1457

## 2023-01-20 NOTE — ED Provider Triage Note (Signed)
Emergency Medicine Provider Triage Evaluation Note  Gary [name] , a 37 y.o. male  was evaluated in triage.  Pt complains of intoxication. Witnessed fall in the park. The patient has bruising to his left face which is not new. No new complaints.   Review of Systems  Positive:  Negative:   Physical Exam  BP 119/78   Pulse 91   Temp 98.7 F (37.1 C)   Resp 18   SpO2 100%  Gen:   Somnolent. Awakens to voice. Not cooperative with exam. Resp:  Normal effort  MSK:   Moves extremities without difficulty  Other:  Bruising seen to left side of face. Moving all extremities.   Medical Decision Making  Medically screening exam initiated at 5:20 PM.  Appropriate orders placed.  Gary [name] was informed that the remainder of the evaluation will be completed by another provider, this initial triage assessment does not replace that evaluation, and the importance of remaining in the ED until their evaluation is complete.  CT and labs ordered   Sherrell Puller, [place] 01/20/23 1723

## 2023-01-20 NOTE — ED Triage Notes (Addendum)
[place] bystanders called; witnessed pt fall; pt unsure of what happened; etoh on board, unsure of how much; previous L shoulder injury, sling in place on arrival; pt c/o bilateral knee pain that he's has for years; 132/90, p 120, cbg 120, 98% RA; pt agitated in triage

## 2023-01-27 ENCOUNTER — Other Ambulatory Visit (HOSPITAL_COMMUNITY): Payer: Self-pay

## 2023-02-09 ENCOUNTER — Encounter (HOSPITAL_COMMUNITY): Payer: Self-pay

## 2023-02-09 ENCOUNTER — Emergency Department (HOSPITAL_COMMUNITY)
Admission: EM | Admit: 2023-02-09 | Discharge: 2023-02-09 | Disposition: A | Discharge status: Home / Self Care | Payer: Medicaid Other | Attending: Emergency Medicine | Admitting: Emergency Medicine

## 2023-02-09 ENCOUNTER — Emergency Department (HOSPITAL_COMMUNITY): Payer: Medicaid Other

## 2023-02-09 ENCOUNTER — Other Ambulatory Visit: Payer: Self-pay

## 2023-02-09 DIAGNOSIS — X58XXXA Exposure to other specified factors, initial encounter: Secondary | ICD-10-CM | POA: Insufficient documentation

## 2023-02-09 DIAGNOSIS — S42032A Displaced fracture of lateral end of left clavicle, initial encounter for closed fracture: Secondary | ICD-10-CM | POA: Diagnosis not present

## 2023-02-09 DIAGNOSIS — S8011XA Contusion of right lower leg, initial encounter: Secondary | ICD-10-CM | POA: Diagnosis not present

## 2023-02-09 DIAGNOSIS — F10120 Alcohol abuse with intoxication, uncomplicated: Secondary | ICD-10-CM | POA: Insufficient documentation

## 2023-02-09 DIAGNOSIS — Y908 Blood alcohol level of 240 mg/100 ml or more: Secondary | ICD-10-CM | POA: Diagnosis not present

## 2023-02-09 DIAGNOSIS — F10920 Alcohol use, unspecified with intoxication, uncomplicated: Secondary | ICD-10-CM

## 2023-02-09 DIAGNOSIS — M25512 Pain in left shoulder: Secondary | ICD-10-CM | POA: Diagnosis present

## 2023-02-09 LAB — ETHANOL: Alcohol, Ethyl (B): 313 mg/dL (ref <10)

## 2023-02-09 NOTE — ED Provider Notes (Signed)
[facility] Provider Note   CSN: [id number] Arrival date & time: 02/09/23  0102     History  Chief Complaint  Patient presents with   Alcohol Intoxication    Gary [name] is a 37 y.o. male.  HPI     This is a 37 year old male who presents with left shoulder pain and intoxication.  He is brought in by EMS.  He is well-known to our emergency department and has frequent evaluations for intoxication.  He states he fell several days ago and has had ongoing left shoulder pain.  He has been ambulatory.  He has been using a sling.  Last drink yesterday and "I did not drink enough."  Home Medications Prior to Admission medications   Medication Sig Start Date End Date Taking? Authorizing Provider  folic acid (FOLVITE) 1 MG tablet Take 1 tablet (1 mg total) by mouth daily. 10/23/22   Shelly Coss, MD  levETIRAcetam (KEPPRA) 500 MG tablet Take 1 tablet (500 mg total) by mouth 2 (two) times daily. 12/27/22   Nita Sells, MD  thiamine (VITAMIN B1) 100 MG tablet Take 1 tablet (100 mg total) by mouth daily. 10/23/22   Shelly Coss, MD  famotidine (PEPCID) 20 MG tablet Take 1 tablet (20 mg total) by mouth 2 (two) times daily. Patient not taking: Reported on 06/01/2019 05/14/19 06/02/19  Davonna Belling, MD      Allergies    Tegretol [carbamazepine] and Caffeine    Review of Systems   Review of Systems  Musculoskeletal:        Shoulder pain  All other systems reviewed and are negative.   Physical Exam Updated Vital Signs BP 103/68 (BP Location: Right Arm)   Pulse 70   Temp 98.7 F (37.1 C) (Oral)   Resp 18   Ht 1.829 m (6')   Wt 66.7 kg   SpO2 96%   BMI 19.94 kg/m  Physical Exam Vitals and nursing note reviewed.  Constitutional:      Appearance: He is well-developed.     Comments: Disheveled appearing  HENT:     Head: Normocephalic and atraumatic.  Eyes:     Pupils: Pupils are equal, round, and reactive to light.   Cardiovascular:     Rate and Rhythm: Normal rate and regular rhythm.  Pulmonary:     Effort: Pulmonary effort is normal. No respiratory distress.  Abdominal:     Palpations: Abdomen is soft.     Tenderness: There is no abdominal tenderness.  Musculoskeletal:     Cervical back: Neck supple.     Comments: Tenderness to palpation left anterior shoulder and distal clavicle, no obvious deformity noted, 2+ radial pulse, hematoma and right lower extremity  Lymphadenopathy:     Cervical: No cervical adenopathy.  Skin:    General: Skin is warm and dry.  Neurological:     Mental Status: He is alert and oriented to person, place, and time.     Comments: Appears intoxicated     ED Results / Procedures / Treatments   Labs (all labs ordered are listed, but only abnormal results are displayed) Labs Reviewed  ETHANOL - Abnormal; Notable for the following components:      Result Value   Alcohol, Ethyl (B) 313 (*)    All other components within normal limits    EKG None  Radiology DG Tibia/Fibula Right  Result Date: 02/09/2023 CLINICAL DATA:  [id number].  Infection.  Hematoma right lower extremity. EXAM: RIGHT  TIBIA AND FIBULA - 2 VIEW COMPARISON:  None Available. FINDINGS: There is no evidence of fracture or other focal bone lesions. There is mild-to-moderate edema in the proximal 3/4 of the foreleg. No radiopaque foreign body is seen, no visible soft tissue gas. There are artifacts from overlying clothing. IMPRESSION: 1. Soft tissue edema. No radiopaque foreign body or soft tissue gas. 2. No evidence of fracture or osteomyelitis. Electronically Signed   By: Telford Nab M.D.   On: 02/09/2023 05:35   DG Hand Complete Left  Result Date: 02/09/2023 CLINICAL DATA:  Fall with left hand swelling EXAM: LEFT HAND - COMPLETE 3 12/30/2022 VIEW COMPARISON:  None Available. FINDINGS: There is no evidence of fracture or dislocation. No acute soft tissue finding. IMPRESSION: Negative. Electronically Signed    By: Jorje Guild M.D.   On: 02/09/2023 04:31   DG Shoulder Left  Result Date: 02/09/2023 CLINICAL DATA:  Fall with bruising.  Intoxication. EXAM: LEFT SHOULDER - 3 VIEW COMPARISON:  None Available. FINDINGS: Lateral third left clavicle fracture with comminution and mild inferior depression of the distal fragment. The [facility] joint and glenohumeral joint remain located. Low volume chest with bands of opacity at the left base attributed atelectasis. No detected rib fracture or pneumothorax on the left. IMPRESSION: Comminuted and inferiorly displaced lateral third left clavicle fracture. Electronically Signed   By: Jorje Guild M.D.   On: 02/09/2023 04:30    Procedures Procedures    Medications Ordered in ED Medications - No data to display  ED Course/ Medical Decision Making/ A&P Clinical Course as of 02/09/23 0640  Tue Feb 09, 2023  [id number] Alcohol level greater than 300.  This is at the patient's baseline.  Clinically he is awake and alert. [CH]    Clinical Course User Index [CH] Zuhayr Deeney, Barbette Hair, MD                             Medical Decision Making Amount and/or Complexity of Data Reviewed Labs: ordered. Radiology: ordered.   This patient presents to the ED for concern of left shoulder pain, alcohol withdrawal, this involves an extensive number of treatment options, and is a complaint that carries with it a high risk of complications and morbidity.  I considered the following differential and admission for this acute, potentially life threatening condition.  The differential diagnosis includes dislocation, fracture, hematoma, contusion  MDM:    This is a 37 year old male well-known to our emergency department who presents with concern for left shoulder pain.  Reports that he fell several days ago.  He is acutely intoxicated.  Denies hitting his head.  He is able to provide history.  Reports pain at the shoulder and distal clavicle.  Also has pain in the left hand and a large  hematoma to the right leg.  He otherwise appears at his baseline.  X-ray confirms fracture of the clavicle.  It is closed.  He does have a sling.  Will have him follow-up with orthopedics.  X-rays of the hand and right lower extremity are without fracture.  He was provided with ice and an Ace bandage given hematoma of the right lower extremity.  Given the fall was several days ago and he appears at his baseline, do not feel he needs advanced imaging of his head or otherwise.  He is ambulatory.  (Labs, imaging, consults)  Labs: I Ordered, and personally interpreted labs.  The pertinent results include: Alcohol level 313.  Imaging Studies ordered: I ordered imaging studies including x-ray left shoulder, left hand, right leg I independently visualized and interpreted imaging. I agree with the radiologist interpretation  Additional history obtained from chart review.  External records from outside source obtained and reviewed including evaluations  Cardiac Monitoring: The patient was not maintained on a cardiac monitor.  If on the cardiac monitor, I personally viewed and interpreted the cardiac monitored which showed an underlying rhythm of: N/A  Reevaluation: After the interventions noted above, I reevaluated the patient and found that they have :stayed the same  Social Determinants of Health:  alcohol abuse  Disposition: Discharge with orthopedic follow-up  Co morbidities that complicate the patient evaluation  Past Medical History:  Diagnosis Date   Alcohol abuse    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   Anxiety    Bipolar 2 disorder ([place])    HIV (human immunodeficiency virus infection)    Pancreatitis    PTSD (post-traumatic stress disorder)    Schizophrenia    Seizures    Subdural hematoma      Medicines No orders of the defined types were placed in this encounter.   I have reviewed the patients home medicines and have made adjustments as needed  Problem List / ED  Course: Problem List Items Addressed This Visit   None Visit Diagnoses     Alcoholic intoxication without complication    -  Primary   Displaced fracture of lateral end of left clavicle, initial encounter for closed fracture       Hematoma of right lower leg                       Final Clinical Impression(s) / ED Diagnoses Final diagnoses:  Alcoholic intoxication without complication  Displaced fracture of lateral end of left clavicle, initial encounter for closed fracture  Hematoma of right lower leg    Rx / DC Orders ED Discharge Orders     None         Merryl Hacker, MD 02/09/23 [phone]

## 2023-02-09 NOTE — Discharge Instructions (Addendum)
You were seen today for shoulder pain and alcohol intoxication.  You have a clavicle fracture.  Continue using the sling as needed.  He also have a hematoma to the leg.  Apply ice and he may use an Ace bandage.

## 2023-02-09 NOTE — ED Provider Triage Note (Signed)
Emergency Medicine Provider Triage Evaluation Note  Gary [name] , a 37 y.o. male  was evaluated in triage.  Pt complains of alcohol intoxication.  Review of Systems  Positive: Intoxication, l shoulder Negative: Cp, sob  Physical Exam  BP 92/62 (BP Location: Right Arm)   Pulse (!) 103   Temp 98.5 F (36.9 C)   Resp 18   Ht 1.829 m (6')   Wt 66.7 kg   SpO2 94%   BMI 19.94 kg/m  Gen:   Sleeping, no distress  Resp:  Normal effort  MSK:   RUE in sling, swelling left hand  Other:  N/a  Medical Decision Making  Medically screening exam initiated at 3:06 AM.  Appropriate orders placed.  BRECCAN LAMPMAN was informed that the remainder of the evaluation will be completed by another provider, this initial triage assessment does not replace that evaluation, and the importance of remaining in the ED until their evaluation is complete.     Merryl Hacker, MD 02/09/23 [phone]

## 2023-02-09 NOTE — ED Triage Notes (Signed)
Pt comes via [facility] for ETOH, fell today, c/o of L shoulder pain and swelling to his L hand. Pt also has a bruise to his L knee

## 2023-04-08 ENCOUNTER — Ambulatory Visit (INDEPENDENT_AMBULATORY_CARE_PROVIDER_SITE_OTHER): Admitting: Internal Medicine

## 2023-04-08 ENCOUNTER — Encounter: Payer: Self-pay | Admitting: Internal Medicine

## 2023-04-08 ENCOUNTER — Other Ambulatory Visit (HOSPITAL_COMMUNITY)
Admission: RE | Admit: 2023-04-08 | Discharge: 2023-04-08 | Disposition: A | Discharge status: Home / Self Care | Source: Ambulatory Visit | Attending: Internal Medicine | Admitting: Internal Medicine

## 2023-04-08 ENCOUNTER — Other Ambulatory Visit: Payer: Self-pay

## 2023-04-08 VITALS — BP 110/73 | HR 78 | Temp 98.7°F

## 2023-04-08 DIAGNOSIS — Z113 Encounter for screening for infections with a predominantly sexual mode of transmission: Secondary | ICD-10-CM | POA: Diagnosis present

## 2023-04-08 DIAGNOSIS — B20 Human immunodeficiency virus [HIV] disease: Secondary | ICD-10-CM

## 2023-04-08 DIAGNOSIS — Z1159 Encounter for screening for other viral diseases: Secondary | ICD-10-CM | POA: Diagnosis not present

## 2023-04-08 NOTE — Progress Notes (Signed)
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   Metabolic acidosis with increased anion gap and accumulation of organic acids 04/16/2022   History of seizures 04/16/2022   Tobacco abuse 04/16/2022   Macrocytosis 12/31/2021   Alcohol-induced insomnia (HCC)    MDD (major depressive disorder), recurrent episode, 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/26/2021   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   Alcohol abuse    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   Malnutrition of moderate degree 05/07/2020   Alcoholic ketoacidosis 05/05/2020   Thrombocytopenia (HCC) 07/25/2019   Transaminitis 07/25/2019   Traumatic subdural hematoma (HCC) 07/02/2019   Alcoholic intoxication with complication (HCC) 07/02/2019   Hypocalcemia 07/02/2019   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   MRN: [id number]  No chief complaint on file.   HPI:  Gary [name] is a 37 y.o. male hiv here for reestablishing care after loss to follow up   #hiv Dx'ed 2018 in [place], [place] Cd4 nadir >200 OI hx - none Risk msm; ivdu Therapy: 2022-c  biktarvy 2020-2022 genvoya  Had history of inconsistent adherence  He last saw dr Daiva Eves in 2022 He was admitted and seen in ED several times the last few months prior to this visit 04/08/23 with alcohol intoxication/withdrawal He was actually seen on the inpatient side in 12/2022 by dr Daiva Eves He is currently in jail  He said his biktarvy was stopped for the past 3 days but was continuously on it the 2 months in jail  He has no health concern today but did say "I might have epilepsi now because it hit my head twice walking into something not seeing it."    #social He has been incarcerated as of 04/08/23 for about 2 months for some misdemeanor    ROS: All other ros negative   Allergies  Allergen Reactions   Tegretol [Carbamazepine] Other (See Comments)    Caused vertigo for 2 days after taking it   Caffeine Palpitations      Outpatient Medications Prior to Visit  Medication Sig Dispense Refill   folic acid (FOLVITE) 1 MG tablet Take 1 tablet (1 mg total) by mouth daily. 30 tablet 0   levETIRAcetam (KEPPRA) 500 MG tablet Take 1 tablet (500 mg total) by mouth 2 (two) times daily. 60 tablet 11   thiamine (VITAMIN B1) 100 MG tablet Take 1 tablet (100 mg total) by mouth daily. 30 tablet 0   No facility-administered medications prior to visit.     Social History   Socioeconomic History   Marital status: Single    Spouse name: Not on file   Number of children: Not on file   Years  of education: Not on file   Highest education level: Not on file  Occupational History   Occupation: unemployed  Tobacco Use   Smoking status: Every Day    Packs/day: .5    Types: Cigarettes   Smokeless tobacco: Never   Tobacco comments:    unable to smoke while incarcerated 6+ months 02/13/20  Vaping Use   Vaping Use: Never used  Substance and Sexual Activity   Alcohol use: Yes    Alcohol/week: 105.0 standard drinks of alcohol    Types: 105 Cans of beer per week   Drug use: Yes     Types: Methamphetamines    Comment: last used 04/10/2019   Sexual activity: Yes    Partners: Male, Male    Comment: declined condoms  03/2021  Other Topics Concern   Not on file  Social History Narrative   "Currently living on the streets"   Independent at baseline   Occasionally goes to the [facility]   Social Determinants of Health   Financial Resource Strain: Not on file  Food Insecurity: No Food Insecurity (12/27/2022)   Hunger Vital Sign    Worried About Running Out of Food in the Last Year: Never true    Ran Out of Food in the Last Year: Never true  Recent Concern: Food Insecurity - Food Insecurity Present (12/24/2022)   Hunger Vital Sign    Worried About Running Out of Food in the Last Year: Sometimes true    Ran Out of Food in the Last Year: Sometimes true  Transportation Needs: Unmet Transportation Needs (12/27/2022)   PRAPARE - Administrator, Civil Service (Medical): Yes    Lack of Transportation (Non-Medical): Yes  Physical Activity: Not on file  Stress: Not on file  Social Connections: Not on file  Intimate Partner Violence: Not At Risk (12/27/2022)   Humiliation, Afraid, Rape, and Kick questionnaire    Fear of Current or Ex-Partner: No    Emotionally Abused: No    Physically Abused: No    Sexually Abused: No      Review of Systems    All other ros negative  Objective:    BP 110/73   Pulse 78   Temp 98.7 F (37.1 C) (Oral)  Nursing note and vital signs reviewed.  Physical Exam     General/constitutional: no distress, pleasant; here with 2 correctional officer HEENT: Normocephalic, PER, Conj Clear, EOMI, Oropharynx clear Neck supple CV: rrr no mrg Lungs: clear to auscultation, normal respiratory effort Abd: Soft, Nontender Ext: no edema Skin: facial erythematous papule Neuro: nonfocal MSK: no peripheral joint swelling/tenderness/warmth; back spines nontender     Labs: Lab Results  Component Value Date   WBC 2.8 (L) 01/20/2023   HGB  11.3 (L) 01/20/2023   HCT 33.3 (L) 01/20/2023   MCV 97.9 01/20/2023   PLT 86 (L) 01/20/2023   Last metabolic panel Lab Results  Component Value Date   GLUCOSE 108 (H) 01/20/2023   NA 139 01/20/2023   K 3.8 01/20/2023   CL 104 01/20/2023   CO2 21 (L) 01/20/2023   BUN 9 01/20/2023   CREATININE 0.70 01/20/2023   GFRNONAA >60 01/20/2023   CALCIUM 8.5 (L) 01/20/2023   PHOS 3.6 12/24/2022   PROT 7.1 01/20/2023   ALBUMIN 3.8 01/20/2023   BILITOT 0.2 (L) 01/20/2023   ALKPHOS 73 01/20/2023   AST 132 (H) 01/20/2023   ALT 75 (H) 01/20/2023   ANIONGAP 14 01/20/2023   Lab Results  Component Value Date  HIV1RNAQUANT 30 12/24/2022   Lab Results  Component Value Date   CD4TCELL 34 12/15/2022   CD4TABS 253 (L) 12/15/2022   HIV:    HIV genotype: 09/2019 [id number] presence; no integrase resistance predicted Micro:  Serology:  Imaging:  Assessment & Plan:   Problem List Items Addressed This Visit       Other   HIV disease (HCC) - Primary   Relevant Orders   CBC w/Diff   COMPLETE METABOLIC PANEL WITH GFR   HIV 1 RNA quant-no reflex-bld   Other Visit Diagnoses     Screening for STDs (sexually transmitted diseases)       Relevant Orders   RPR   Urine cytology ancillary only   Cytology (oral, anal, urethral) ancillary only   Cytology (oral, anal, urethral) ancillary only   Need for hepatitis B screening test       Relevant Orders   Hepatitis B surface antibody,quantitative   Hepatitis, Acute   Need for hepatitis C screening test             No orders of the defined types were placed in this encounter.  #hiv Msm/ivdu Dxed 2018 Darrin Luis 2020-2022 Biktarvy 2022-c   In jail currently and has been compliant except 3 days prior to 04/08/23     -discussed u=u -encourage compliance -continue current HIV medication -labs today -f/u in 6 months    #etoh abuse Had passed forced detox for last 2 months in jail as of 04/08/23    #hcm -vaccination Will  review next few visits -hepatitis Repeat testing today -tb Deferred -std Triple screen and rpr testing today 04/08/23 -cancer screening He meets anal pap at age 70 criteria and will do next visit    Follow-up: Return in about 6 months (around 10/09/2023).      Raymondo Band, MD [facility] 04/08/2023, 3:09 PM

## 2023-04-08 NOTE — Addendum Note (Signed)
Addended by: Juanita Laster on: 04/08/2023 03:30 PM   Modules accepted: Orders

## 2023-04-08 NOTE — Patient Instructions (Signed)
Pap smear will be done next visit  Continue your biktarvy   See me in 6 months

## 2023-04-09 LAB — CYTOLOGY, (ORAL, ANAL, URETHRAL) ANCILLARY ONLY
Chlamydia: NEGATIVE
Comment: NEGATIVE
Comment: NORMAL
Neisseria Gonorrhea: NEGATIVE

## 2023-04-09 LAB — URINE CYTOLOGY ANCILLARY ONLY
Chlamydia: NEGATIVE
Comment: NEGATIVE
Comment: NEGATIVE
Comment: NORMAL
Neisseria Gonorrhea: NEGATIVE
Trichomonas: NEGATIVE

## 2023-04-10 LAB — CBC WITH DIFFERENTIAL/PLATELET
Absolute Monocytes: 353 cells/uL (ref 200–950)
Basophils Absolute: 30 cells/uL (ref 0–200)
Basophils Relative: 0.8 %
Eosinophils Absolute: 152 cells/uL (ref 15–500)
Eosinophils Relative: 4 %
HCT: 40.9 % (ref 38.5–50.0)
Hemoglobin: 14.1 g/dL (ref 13.2–17.1)
Lymphs Abs: 1474 cells/uL (ref 850–3900)
MCH: 32.6 pg (ref 27.0–33.0)
MCHC: 34.5 g/dL (ref 32.0–36.0)
MCV: 94.5 fL (ref 80.0–100.0)
MPV: 10.5 fL (ref 7.5–12.5)
Monocytes Relative: 9.3 %
Neutro Abs: 1790 cells/uL (ref 1500–7800)
Neutrophils Relative %: 47.1 %
Platelets: 214 10*3/uL (ref 140–400)
RBC: 4.33 10*6/uL (ref 4.20–5.80)
RDW: 12.4 % (ref 11.0–15.0)
Total Lymphocyte: 38.8 %
WBC: 3.8 10*3/uL (ref 3.8–10.8)

## 2023-04-10 LAB — COMPLETE METABOLIC PANEL WITH GFR
AG Ratio: 1.6 (calc) (ref 1.0–2.5)
ALT: 21 U/L (ref 9–46)
AST: 16 U/L (ref 10–40)
Albumin: 4.6 g/dL (ref 3.6–5.1)
Alkaline phosphatase (APISO): 50 U/L (ref 36–130)
BUN/Creatinine Ratio: 10 (calc) (ref 6–22)
BUN: 6 mg/dL — ABNORMAL LOW (ref 7–25)
CO2: 30 mmol/L (ref 20–32)
Calcium: 9.7 mg/dL (ref 8.6–10.3)
Chloride: 104 mmol/L (ref 98–110)
Creat: 0.59 mg/dL — ABNORMAL LOW (ref 0.60–1.26)
Globulin: 2.8 g/dL (calc) (ref 1.9–3.7)
Glucose, Bld: 87 mg/dL (ref 65–99)
Potassium: 4.1 mmol/L (ref 3.5–5.3)
Sodium: 141 mmol/L (ref 135–146)
Total Bilirubin: 0.4 mg/dL (ref 0.2–1.2)
Total Protein: 7.4 g/dL (ref 6.1–8.1)
eGFR: 129 mL/min/{1.73_m2} (ref >=60)

## 2023-04-10 LAB — HIV-1 RNA QUANT-NO REFLEX-BLD
HIV 1 RNA Quant: NOT DETECTED Copies/mL
HIV-1 RNA Quant, Log: NOT DETECTED Log cps/mL

## 2023-04-10 LAB — HEPATITIS PANEL, ACUTE
Hep A IgM: NONREACTIVE
Hep B C IgM: NONREACTIVE
Hepatitis B Surface Ag: NONREACTIVE
Hepatitis C Ab: NONREACTIVE

## 2023-04-10 LAB — RPR: RPR Ser Ql: NONREACTIVE

## 2023-04-10 LAB — HEPATITIS B SURFACE ANTIBODY, QUANTITATIVE: Hep B S AB Quant (Post): <5 m[IU]/mL — ABNORMAL LOW (ref >=10)

## 2023-04-12 ENCOUNTER — Other Ambulatory Visit: Payer: Self-pay

## 2023-04-12 DIAGNOSIS — B20 Human immunodeficiency virus [HIV] disease: Secondary | ICD-10-CM

## 2023-04-12 MED ORDER — BICTEGRAVIR-EMTRICITAB-TENOFOV 50-200-25 MG PO TABS: 1.0000 | ORAL_TABLET | Freq: Every day | ORAL | 3 refills | Status: AC

## 2023-05-07 ENCOUNTER — Encounter (HOSPITAL_COMMUNITY): Payer: Self-pay

## 2023-05-07 ENCOUNTER — Emergency Department (HOSPITAL_COMMUNITY)
Admission: EM | Admit: 2023-05-07 | Discharge: 2023-05-08 | Disposition: A | Discharge status: Home / Self Care | Payer: Medicaid Other | Attending: Emergency Medicine | Admitting: Emergency Medicine

## 2023-05-07 ENCOUNTER — Other Ambulatory Visit: Payer: Self-pay

## 2023-05-07 DIAGNOSIS — F10120 Alcohol abuse with intoxication, uncomplicated: Secondary | ICD-10-CM | POA: Diagnosis not present

## 2023-05-07 DIAGNOSIS — Z21 Asymptomatic human immunodeficiency virus [HIV] infection status: Secondary | ICD-10-CM | POA: Diagnosis not present

## 2023-05-07 DIAGNOSIS — Y908 Blood alcohol level of 240 mg/100 ml or more: Secondary | ICD-10-CM | POA: Diagnosis not present

## 2023-05-07 DIAGNOSIS — R4182 Altered mental status, unspecified: Secondary | ICD-10-CM | POA: Diagnosis present

## 2023-05-07 DIAGNOSIS — F10920 Alcohol use, unspecified with intoxication, uncomplicated: Secondary | ICD-10-CM

## 2023-05-07 LAB — CBG MONITORING, ED: Glucose-Capillary: 99 mg/dL (ref 70–99)

## 2023-05-07 LAB — RAPID URINE DRUG SCREEN, HOSP PERFORMED
Amphetamines: NOT DETECTED
Barbiturates: NOT DETECTED
Benzodiazepines: NOT DETECTED
Cocaine: NOT DETECTED
Opiates: NOT DETECTED
Tetrahydrocannabinol: NOT DETECTED

## 2023-05-07 MED ORDER — SODIUM CHLORIDE 0.9 % IV BOLUS
1000.0000 mL | Freq: Once | INTRAVENOUS | Status: AC
  Administered 2023-05-07: 1000 mL via INTRAVENOUS

## 2023-05-07 MED ORDER — LEVETIRACETAM 500 MG PO TABS
500.0000 mg | ORAL_TABLET | Freq: Once | ORAL | Status: AC
  Administered 2023-05-07: 500 mg via ORAL
  Filled 2023-05-07: qty 1

## 2023-05-07 MED ORDER — BICTEGRAVIR-EMTRICITAB-TENOFOV 50-200-25 MG PO TABS
1.0000 | ORAL_TABLET | Freq: Once | ORAL | Status: AC
  Administered 2023-05-07: 1 via ORAL
  Filled 2023-05-07: qty 1

## 2023-05-07 NOTE — ED Provider Notes (Signed)
[place] EMERGENCY DEPARTMENT AT [facility] Provider Note   CSN: [id number] Arrival date & time: 05/07/23  2217     History {Add pertinent medical, surgical, social history, OB history to HPI:1} Chief Complaint  Patient presents with   Altered Mental Status    Gary [name] is a 37 y.o. male.  The history is provided by the patient and medical records.  Altered Mental Status  37 y.o. M with hx of alcohol abuse, HIV, bipolar disorder, seizures, schizophrenia, presenting to the ED for reported AMS.  Patient reports that his whole body hurts and he feels "terrible".  He states he has not had his medications in about 2 days as the jail would not give them to him prior to his release.  States he has been out in the hot sun all day.  He did not really eat but has been drinking alcohol today, unable to quantify exactly how much.  He denies nausea/vomiting.  No fevers.  No chest pain reported.    Home Medications Prior to Admission medications   Medication Sig Start Date End Date Taking? Authorizing Provider  bictegravir-emtricitabine-tenofovir AF (BIKTARVY) 50-200-25 MG TABS tablet Take 1 tablet by mouth daily. 04/12/23 08/10/23  Vu, Tonita Phoenix, MD  folic acid (FOLVITE) 1 MG tablet Take 1 tablet (1 mg total) by mouth daily. 10/23/22   Burnadette Pop, MD  levETIRAcetam (KEPPRA) 500 MG tablet Take 1 tablet (500 mg total) by mouth 2 (two) times daily. 12/27/22   Rhetta Mura, MD  thiamine (VITAMIN B1) 100 MG tablet Take 1 tablet (100 mg total) by mouth daily. 10/23/22   Burnadette Pop, MD  famotidine (PEPCID) 20 MG tablet Take 1 tablet (20 mg total) by mouth 2 (two) times daily. Patient not taking: Reported on 06/01/2019 05/14/19 06/02/19  Benjiman Core, MD      Allergies    Tegretol [carbamazepine] and Caffeine    Review of Systems   Review of Systems  Musculoskeletal:  Positive for myalgias.  All other systems reviewed and are negative.   Physical Exam Updated  Vital Signs BP 107/75 (BP Location: Left Arm)   Pulse (!) 109   Temp 98.5 F (36.9 C) (Axillary)   Resp 18   Ht 6' (1.829 m)   Wt 67 kg   SpO2 96%   BMI 20.03 kg/m   Physical Exam Vitals and nursing note reviewed.  Constitutional:      Appearance: He is well-developed.     Comments: Sunburnt including face, neck, arms  HENT:     Head: Normocephalic and atraumatic.     Mouth/Throat:     Comments: Breath smells of EtOH, poor dentition without acute injury Eyes:     Conjunctiva/sclera: Conjunctivae normal.     Pupils: Pupils are equal, round, and reactive to light.     Comments: PERRL  Cardiovascular:     Rate and Rhythm: Normal rate and regular rhythm.     Heart sounds: Normal heart sounds.  Pulmonary:     Effort: Pulmonary effort is normal.     Breath sounds: Normal breath sounds.  Abdominal:     General: Bowel sounds are normal.     Palpations: Abdomen is soft.  Musculoskeletal:        General: Normal range of motion.     Cervical back: Normal range of motion.  Skin:    General: Skin is warm and dry.  Neurological:     Mental Status: He is alert and oriented to person,  place, and time.     Comments: AAOx3, able to answer questions and follow commands, moving extremities spontaneously without focal deficit  Psychiatric:     Comments: Rude affect     ED Results / Procedures / Treatments   Labs (all labs ordered are listed, but only abnormal results are displayed) Labs Reviewed  CBC WITH DIFFERENTIAL/PLATELET  COMPREHENSIVE METABOLIC PANEL  ETHANOL  RAPID URINE DRUG SCREEN, HOSP PERFORMED  CK  CBG MONITORING, ED    EKG None  Radiology No results found.  Procedures Procedures  {Document cardiac monitor, telemetry assessment procedure when appropriate:1}  Medications Ordered in ED Medications  sodium chloride 0.9 % bolus 1,000 mL (has no administration in time range)  levETIRAcetam (KEPPRA) tablet 500 mg (has no administration in time range)   bictegravir-emtricitabine-tenofovir AF (BIKTARVY) 50-200-25 MG per tablet 1 tablet (has no administration in time range)    ED Course/ Medical Decision Making/ A&P   {   Click here for ABCD2, HEART and other calculatorsREFRESH Note before signing :1}                          Medical Decision Making Amount and/or Complexity of Data Reviewed Labs: ordered. ECG/medicine tests: ordered.  Risk Prescription drug management.   ***  {Document critical care time when appropriate:1} {Document review of labs and clinical decision tools ie heart score, Chads2Vasc2 etc:1}  {Document your independent review of radiology images, and any outside records:1} {Document your discussion with family members, caretakers, and with consultants:1} {Document social determinants of health affecting pt's care:1} {Document your decision making why or why not admission, treatments were needed:1} Final Clinical Impression(s) / ED Diagnoses Final diagnoses:  None    Rx / DC Orders ED Discharge Orders     None

## 2023-05-07 NOTE — ED Triage Notes (Signed)
Pt presents via EMS for altered mental status at this time. Pt is alert and oriented to person only. Pt reports the date is Tuesday and he was released from jail yesterday (Monday). Pt skin severely red and warm to touch pt reports he has been out in the sun all day today, drinking alcohol of unknown quantity. Pt reports he has not had his seizure and HIV meds since before he was released from jail Monday.

## 2023-05-08 ENCOUNTER — Emergency Department (HOSPITAL_COMMUNITY)
Admission: EM | Admit: 2023-05-08 | Discharge: 2023-05-08 | Disposition: A | Discharge status: Home / Self Care | Payer: Medicaid Other | Source: Home / Self Care | Attending: Emergency Medicine | Admitting: Emergency Medicine

## 2023-05-08 DIAGNOSIS — Y908 Blood alcohol level of 240 mg/100 ml or more: Secondary | ICD-10-CM | POA: Insufficient documentation

## 2023-05-08 DIAGNOSIS — F10920 Alcohol use, unspecified with intoxication, uncomplicated: Secondary | ICD-10-CM

## 2023-05-08 DIAGNOSIS — F10120 Alcohol abuse with intoxication, uncomplicated: Secondary | ICD-10-CM | POA: Insufficient documentation

## 2023-05-08 LAB — CBC
HCT: 36.7 % — ABNORMAL LOW (ref 39.0–52.0)
Hemoglobin: 12.6 g/dL — ABNORMAL LOW (ref 13.0–17.0)
MCH: 32.3 pg (ref 26.0–34.0)
MCHC: 34.3 g/dL (ref 30.0–36.0)
MCV: 94.1 fL (ref 80.0–100.0)
Platelets: 234 10*3/uL (ref 150–400)
RBC: 3.9 MIL/uL — ABNORMAL LOW (ref 4.22–5.81)
RDW: 12.7 % (ref 11.5–15.5)
WBC: 6.1 10*3/uL (ref 4.0–10.5)
nRBC: 0 % (ref 0.0–0.2)

## 2023-05-08 LAB — CBC WITH DIFFERENTIAL/PLATELET
Abs Immature Granulocytes: 0.02 10*3/uL (ref 0.00–0.07)
Basophils Absolute: 0.1 10*3/uL (ref 0.0–0.1)
Basophils Relative: 1 %
Eosinophils Absolute: 0.1 10*3/uL (ref 0.0–0.5)
Eosinophils Relative: 2 %
HCT: 35.7 % — ABNORMAL LOW (ref 39.0–52.0)
Hemoglobin: 12.2 g/dL — ABNORMAL LOW (ref 13.0–17.0)
Immature Granulocytes: 0 %
Lymphocytes Relative: 26 %
Lymphs Abs: 1.8 10*3/uL (ref 0.7–4.0)
MCH: 31.9 pg (ref 26.0–34.0)
MCHC: 34.2 g/dL (ref 30.0–36.0)
MCV: 93.2 fL (ref 80.0–100.0)
Monocytes Absolute: 0.5 10*3/uL (ref 0.1–1.0)
Monocytes Relative: 7 %
Neutro Abs: 4.5 10*3/uL (ref 1.7–7.7)
Neutrophils Relative %: 64 %
Platelets: 234 10*3/uL (ref 150–400)
RBC: 3.83 MIL/uL — ABNORMAL LOW (ref 4.22–5.81)
RDW: 12.6 % (ref 11.5–15.5)
WBC: 7.1 10*3/uL (ref 4.0–10.5)
nRBC: 0 % (ref 0.0–0.2)

## 2023-05-08 LAB — COMPREHENSIVE METABOLIC PANEL
ALT: 18 U/L (ref 0–44)
ALT: 19 U/L (ref 0–44)
AST: 22 U/L (ref 15–41)
AST: 27 U/L (ref 15–41)
Albumin: 4.2 g/dL (ref 3.5–5.0)
Albumin: 4.4 g/dL (ref 3.5–5.0)
Alkaline Phosphatase: 72 U/L (ref 38–126)
Alkaline Phosphatase: 79 U/L (ref 38–126)
Anion gap: 12 (ref 5–15)
Anion gap: 7 (ref 5–15)
BUN: 16 mg/dL (ref 6–20)
BUN: 9 mg/dL (ref 6–20)
CO2: 23 mmol/L (ref 22–32)
CO2: 27 mmol/L (ref 22–32)
Calcium: 8.2 mg/dL — ABNORMAL LOW (ref 8.9–10.3)
Calcium: 8.6 mg/dL — ABNORMAL LOW (ref 8.9–10.3)
Chloride: 111 mmol/L (ref 98–111)
Chloride: 111 mmol/L (ref 98–111)
Creatinine, Ser: 0.73 mg/dL (ref 0.61–1.24)
Creatinine, Ser: 0.73 mg/dL (ref 0.61–1.24)
GFR, Estimated: >60 mL/min (ref >60)
GFR, Estimated: >60 mL/min (ref >60)
Glucose, Bld: 101 mg/dL — ABNORMAL HIGH (ref 70–99)
Glucose, Bld: 102 mg/dL — ABNORMAL HIGH (ref 70–99)
Potassium: 3.2 mmol/L — ABNORMAL LOW (ref 3.5–5.1)
Potassium: 3.8 mmol/L (ref 3.5–5.1)
Sodium: 145 mmol/L (ref 135–145)
Sodium: 146 mmol/L — ABNORMAL HIGH (ref 135–145)
Total Bilirubin: 0.7 mg/dL (ref 0.3–1.2)
Total Bilirubin: 0.7 mg/dL (ref 0.3–1.2)
Total Protein: 7.6 g/dL (ref 6.5–8.1)
Total Protein: 7.6 g/dL (ref 6.5–8.1)

## 2023-05-08 LAB — ETHANOL
Alcohol, Ethyl (B): 311 mg/dL (ref <10)
Alcohol, Ethyl (B): 429 mg/dL (ref <10)

## 2023-05-08 LAB — CK: Total CK: 479 U/L — ABNORMAL HIGH (ref 49–397)

## 2023-05-08 NOTE — ED Provider Notes (Signed)
[place] EMERGENCY DEPARTMENT AT [facility] Provider Note   CSN: [id number] Arrival date & time: 05/08/23  1225     History  Chief Complaint  Patient presents with   Alcohol Intoxication    Gary [name] is a 37 y.o. male.  HPI    Pt comes in w/ cc of alcohol intoxication. Patient has history of chronic alcoholism.  He comes to the emergency room because he was found laying on the sidewalk, passed out.  He is refusing care by EMS and also refusing care by [place].  Patient wants his backpacking, and just wants to be left alone.  Home Medications Prior to Admission medications   Medication Sig Start Date End Date Taking? Authorizing Provider  bictegravir-emtricitabine-tenofovir AF (BIKTARVY) 50-200-25 MG TABS tablet Take 1 tablet by mouth daily. 04/12/23 08/10/23  Vu, Tonita Phoenix, MD  folic acid (FOLVITE) 1 MG tablet Take 1 tablet (1 mg total) by mouth daily. 10/23/22   Burnadette Pop, MD  levETIRAcetam (KEPPRA) 500 MG tablet Take 1 tablet (500 mg total) by mouth 2 (two) times daily. 12/27/22   Rhetta Mura, MD  thiamine (VITAMIN B1) 100 MG tablet Take 1 tablet (100 mg total) by mouth daily. 10/23/22   Burnadette Pop, MD  famotidine (PEPCID) 20 MG tablet Take 1 tablet (20 mg total) by mouth 2 (two) times daily. Patient not taking: Reported on 06/01/2019 05/14/19 06/02/19  Benjiman Core, MD      Allergies    Tegretol [carbamazepine] and Caffeine    Review of Systems   Review of Systems  Physical Exam Updated Vital Signs BP (!) 119/91 (BP Location: Right Arm)   Pulse (!) 114   Temp 99.5 F (37.5 C) (Oral)   Resp 18   SpO2 100%  Physical Exam Vitals and nursing note reviewed.  Constitutional:      Appearance: He is well-developed.     Comments: Appears intoxicated, but alert and answers to questions appropriately  HENT:     Head: Atraumatic.  Cardiovascular:     Rate and Rhythm: Normal rate.  Pulmonary:     Effort: Pulmonary effort is normal.   Musculoskeletal:     Cervical back: Neck supple.  Skin:    General: Skin is warm.  Neurological:     Mental Status: He is alert and oriented to person, place, and time.     Cranial Nerves: No cranial nerve deficit.     Gait: Gait normal.     ED Results / Procedures / Treatments   Labs (all labs ordered are listed, but only abnormal results are displayed) Labs Reviewed  COMPREHENSIVE METABOLIC PANEL  ETHANOL  CBC  RAPID URINE DRUG SCREEN, HOSP PERFORMED    EKG None  Radiology No results found.  Procedures Procedures    Medications Ordered in ED Medications - No data to display  ED Course/ Medical Decision Making/ A&P                             Medical Decision Making Amount and/or Complexity of Data Reviewed Labs: ordered.  37 year old male comes in with chief complaint of alcohol intoxication.  Per EMS, patient was found laying on the sidewalk, unresponsive and bystanders called EMS.  Patient has been alert for them, but refusing vital sign assessment or interventions.  Patient has been threatening to her nursing staff, not allowing them to take over his backpack which has alcohol limited.  I assessed the  patient.  He is oriented to self, location and time.  He admits to alcohol use.   Patient is clinically sober. He is talking coherently, gait is normal, and is demonstrating rational thought process.  He does not want to stay in the hospital.  Will discharge him.  Final Clinical Impression(s) / ED Diagnoses Final diagnoses:  Alcoholic intoxication without complication [facility])    Rx / DC Orders ED Discharge Orders     None         Derwood Kaplan, MD 05/08/23 1331

## 2023-05-08 NOTE — ED Notes (Signed)
Pt refused AVS at discharge. Pt advised to abstain from ETOH and drink plenty of fluids. Pt continues to tell this RN to stay away and making threats. Security accompanied pt to exit.

## 2023-05-08 NOTE — ED Notes (Addendum)
Pt brought back to [place] [facility] and is making threats toward staff because his back pack was placed in a secure area. Pt stating he wants his back pack and to leave. Attempts to deescalate unsuccessful. EDP notified and is at bedside to evaluate pt. Pt was noted to walk with an even steady gait at [facility] request.

## 2023-05-08 NOTE — Discharge Instructions (Addendum)
Your labs today were normal aside from alcohol in your system. Continue your medications. Follow-up with your doctor. Return here for new concerns.

## 2023-05-08 NOTE — ED Triage Notes (Signed)
Pt BIBA from street. Pt was found laying on sidewalk passed out. Pt was sweating and sunburned. Pt intoxicated and refused EMS vitals.   AOx4

## 2023-05-20 ENCOUNTER — Encounter (HOSPITAL_COMMUNITY): Payer: Self-pay | Admitting: Emergency Medicine

## 2023-05-20 ENCOUNTER — Emergency Department (HOSPITAL_COMMUNITY)
Admission: EM | Admit: 2023-05-20 | Discharge: 2023-05-21 | Disposition: A | Discharge status: Home / Self Care | Payer: MEDICAID | Attending: Emergency Medicine | Admitting: Emergency Medicine

## 2023-05-20 ENCOUNTER — Emergency Department (HOSPITAL_COMMUNITY)
Admission: EM | Admit: 2023-05-20 | Discharge: 2023-05-20 | Disposition: A | Discharge status: Home / Self Care | Payer: MEDICAID | Attending: Emergency Medicine | Admitting: Emergency Medicine

## 2023-05-20 DIAGNOSIS — Y909 Presence of alcohol in blood, level not specified: Secondary | ICD-10-CM | POA: Insufficient documentation

## 2023-05-20 DIAGNOSIS — F10129 Alcohol abuse with intoxication, unspecified: Secondary | ICD-10-CM | POA: Diagnosis not present

## 2023-05-20 DIAGNOSIS — F1010 Alcohol abuse, uncomplicated: Secondary | ICD-10-CM

## 2023-05-20 DIAGNOSIS — M79604 Pain in right leg: Secondary | ICD-10-CM | POA: Insufficient documentation

## 2023-05-20 DIAGNOSIS — Z21 Asymptomatic human immunodeficiency virus [HIV] infection status: Secondary | ICD-10-CM | POA: Diagnosis not present

## 2023-05-20 DIAGNOSIS — M79605 Pain in left leg: Secondary | ICD-10-CM | POA: Insufficient documentation

## 2023-05-20 DIAGNOSIS — F10920 Alcohol use, unspecified with intoxication, uncomplicated: Secondary | ICD-10-CM

## 2023-05-20 LAB — COMPREHENSIVE METABOLIC PANEL
ALT: 36 U/L (ref 0–44)
AST: 57 U/L — ABNORMAL HIGH (ref 15–41)
Albumin: 4.1 g/dL (ref 3.5–5.0)
Alkaline Phosphatase: 78 U/L (ref 38–126)
Anion gap: 12 (ref 5–15)
BUN: 6 mg/dL (ref 6–20)
CO2: 23 mmol/L (ref 22–32)
Calcium: 8.6 mg/dL — ABNORMAL LOW (ref 8.9–10.3)
Chloride: 105 mmol/L (ref 98–111)
Creatinine, Ser: 0.56 mg/dL — ABNORMAL LOW (ref 0.61–1.24)
GFR, Estimated: >60 mL/min (ref >60)
Glucose, Bld: 92 mg/dL (ref 70–99)
Potassium: 3.8 mmol/L (ref 3.5–5.1)
Sodium: 140 mmol/L (ref 135–145)
Total Bilirubin: 0.7 mg/dL (ref 0.3–1.2)
Total Protein: 7.7 g/dL (ref 6.5–8.1)

## 2023-05-20 LAB — CBC WITH DIFFERENTIAL/PLATELET
Abs Immature Granulocytes: 0 10*3/uL (ref 0.00–0.07)
Basophils Absolute: 0 10*3/uL (ref 0.0–0.1)
Basophils Relative: 0 %
Eosinophils Absolute: 0 10*3/uL (ref 0.0–0.5)
Eosinophils Relative: 2 %
HCT: 39.9 % (ref 39.0–52.0)
Hemoglobin: 13.5 g/dL (ref 13.0–17.0)
Immature Granulocytes: 0 %
Lymphocytes Relative: 47 %
Lymphs Abs: 1.2 10*3/uL (ref 0.7–4.0)
MCH: 32.2 pg (ref 26.0–34.0)
MCHC: 33.8 g/dL (ref 30.0–36.0)
MCV: 95.2 fL (ref 80.0–100.0)
Monocytes Absolute: 0.2 10*3/uL (ref 0.1–1.0)
Monocytes Relative: 7 %
Neutro Abs: 1.1 10*3/uL — ABNORMAL LOW (ref 1.7–7.7)
Neutrophils Relative %: 44 %
Platelets: 79 10*3/uL — ABNORMAL LOW (ref 150–400)
RBC: 4.19 MIL/uL — ABNORMAL LOW (ref 4.22–5.81)
RDW: 14.1 % (ref 11.5–15.5)
WBC: 2.6 10*3/uL — ABNORMAL LOW (ref 4.0–10.5)
nRBC: 0 % (ref 0.0–0.2)

## 2023-05-20 LAB — CK: Total CK: 418 U/L — ABNORMAL HIGH (ref 49–397)

## 2023-05-20 MED ORDER — CHLORDIAZEPOXIDE HCL 25 MG PO CAPS
25.0000 mg | ORAL_CAPSULE | Freq: Once | ORAL | Status: AC
  Administered 2023-05-20: 25 mg via ORAL
  Filled 2023-05-20: qty 1

## 2023-05-20 MED ORDER — LACTATED RINGERS IV BOLUS
1000.0000 mL | Freq: Once | INTRAVENOUS | Status: AC
  Administered 2023-05-20: 1000 mL via INTRAVENOUS

## 2023-05-20 MED ORDER — ONDANSETRON 8 MG PO TBDP
8.0000 mg | ORAL_TABLET | Freq: Three times a day (TID) | ORAL | 0 refills | Status: DC | PRN
  Filled 2023-05-20: qty 12, 4d supply, fill #0

## 2023-05-20 MED ORDER — IBUPROFEN 200 MG PO TABS: 600.0000 mg | ORAL_TABLET | Freq: Once | ORAL | Status: DC

## 2023-05-20 MED ORDER — CHLORDIAZEPOXIDE HCL 25 MG PO CAPS
ORAL_CAPSULE | ORAL | 0 refills | Status: AC
  Filled 2023-05-20: qty 12, 3d supply, fill #0

## 2023-05-20 NOTE — ED Triage Notes (Signed)
Pt arrives via EMS with reports of leg pain from walking, been going on "all of my life." Also wants help with etoh abuse.

## 2023-05-20 NOTE — ED Triage Notes (Signed)
Pt returning to department for ETOH withdrawal. Pt seen for same earlier today for same.

## 2023-05-20 NOTE — ED Provider Notes (Signed)
[name] EMERGENCY DEPARTMENT AT [facility] Provider Note   CSN: [id number] Arrival date & time: 05/20/23  2237     History  Chief Complaint  Patient presents with   Alcohol Intoxication    Gary [name] is a 37 y.o. male.   Alcohol Intoxication     Patient presents to the ED with complaints of alcohol withdrawal.  Patient was seen in the ED earlier today for alcohol intoxication.  He was monitored for several hours and was ultimately discharged.  Patient states he now feels like he is withdrawing.  Patient is not having any vomiting or diarrhea.  He has not had any chest pain or shortness of breath.  He is not having any shaking.  Home Medications Prior to Admission medications   Medication Sig Start Date End Date Taking? Authorizing Provider  bictegravir-emtricitabine-tenofovir AF (BIKTARVY) 50-200-25 MG TABS tablet Take 1 tablet by mouth daily. 04/12/23 08/10/23  Vu, Tonita Phoenix, MD  chlordiazePOXIDE (LIBRIUM) 25 MG capsule 50mg  PO TID x 1D, then 25-50mg  PO BID X 1D, then 25-50mg  PO QD X 1D 05/20/23  Yes Linwood Dibbles, MD  folic acid (FOLVITE) 1 MG tablet Take 1 tablet (1 mg total) by mouth daily. 10/23/22   Burnadette Pop, MD  levETIRAcetam (KEPPRA) 500 MG tablet Take 1 tablet (500 mg total) by mouth 2 (two) times daily. 12/27/22   Rhetta Mura, MD  ondansetron (ZOFRAN-ODT) 8 MG disintegrating tablet Take 1 tablet (8 mg total) by mouth every 8 (eight) hours as needed for nausea or vomiting. 05/20/23  Yes Linwood Dibbles, MD  thiamine (VITAMIN B1) 100 MG tablet Take 1 tablet (100 mg total) by mouth daily. 10/23/22   Burnadette Pop, MD  famotidine (PEPCID) 20 MG tablet Take 1 tablet (20 mg total) by mouth 2 (two) times daily. Patient not taking: Reported on 06/01/2019 05/14/19 06/02/19  Benjiman Core, MD      Allergies    Tegretol [carbamazepine] and Caffeine    Review of Systems   Review of Systems  Physical Exam Updated Vital Signs BP (!) 125/95   Pulse 100    Temp 99 F (37.2 C) (Oral)   Resp 19   SpO2 98%  Physical Exam Vitals and nursing note reviewed.  Constitutional:      Appearance: He is well-developed. He is not diaphoretic.     Comments: Disheveled  HENT:     Head: Normocephalic and atraumatic.     Right Ear: External ear normal.     Left Ear: External ear normal.  Eyes:     General: No scleral icterus.       Right eye: No discharge.        Left eye: No discharge.     Conjunctiva/sclera: Conjunctivae normal.  Neck:     Trachea: No tracheal deviation.  Cardiovascular:     Rate and Rhythm: Normal rate and regular rhythm.  Pulmonary:     Effort: Pulmonary effort is normal. No respiratory distress.     Breath sounds: Normal breath sounds. No stridor. No wheezing or rales.  Abdominal:     General: Bowel sounds are normal. There is no distension.     Palpations: Abdomen is soft.     Tenderness: There is no abdominal tenderness. There is no guarding or rebound.  Musculoskeletal:        General: No tenderness or deformity.     Cervical back: Neck supple.  Skin:    General: Skin is warm and dry.  Findings: No rash.  Neurological:     General: No focal deficit present.     Mental Status: He is alert.     Cranial Nerves: No cranial nerve deficit, dysarthria or facial asymmetry.     Sensory: No sensory deficit.     Motor: No tremor, abnormal muscle tone or seizure activity.     Coordination: Coordination normal.  Psychiatric:        Mood and Affect: Mood normal.     ED Results / Procedures / Treatments   Labs (all labs ordered are listed, but only abnormal results are displayed) Labs Reviewed - No data to display  EKG None  Radiology No results found.  Procedures Procedures    Medications Ordered in ED Medications  chlordiazePOXIDE (LIBRIUM) capsule 25 mg (has no administration in time range)    ED Course/ Medical Decision Making/ A&P                             Medical Decision  Making Risk Prescription drug management.   Patient presents with complaints of alcohol withdrawal.  Patient has not shown any signs of withdrawal at this time.  He is not tachycardic or hypertensive.  He is not tremulous.  Patient unfortunately does have a significant problem with alcohol use.  He has been in the ED 18 times in the last 6 months.  Patient did have laboratory test earlier today.  He had a slightly increased creatinine kinase at 418 but that was decreased from previous values.  He had a CBC and metabolic panel.  No signs of any acute electrolyte abnormalities.  Patient does not appear to have severe withdrawal requiring inpatient hospitalization.  I will give him a prescription for Librium.  He was also given a dose in the ED.  Discussed outpatient follow-up with the behavioral health urgent care        Final Clinical Impression(s) / ED Diagnoses Final diagnoses:  Alcohol abuse    Rx / DC Orders ED Discharge Orders          Ordered    ondansetron (ZOFRAN-ODT) 8 MG disintegrating tablet  Every 8 hours PRN        05/20/23 2311    chlordiazePOXIDE (LIBRIUM) 25 MG capsule        05/20/23 2311              Linwood Dibbles, MD 05/20/23 2312

## 2023-05-20 NOTE — ED Provider Notes (Signed)
Patient more alert and awake now.  Vital signs stable.  Appears stable for discharge.   Linwood Dibbles, MD 05/20/23 [phone]

## 2023-05-20 NOTE — ED Provider Notes (Signed)
[place] EMERGENCY DEPARTMENT AT [facility] Provider Note   CSN: [id number] Arrival date & time: 05/20/23  1241     History  Chief Complaint  Patient presents with   Leg Pain   Alcohol Problem    Gary [name] is a 37 y.o. male.  HPI 37 year old male with a history of alcohol abuse, HIV, schizophrenia, presents with leg pain.  He states this is a chronic problem from walking too much.  Both of his legs are hurting.  When asked, he states he is urinating less and the urine is dark and states he feels dehydrated.  He denies any chest pain or shortness of breath.  He states he has a "drinking problem" and drank a lot today.  Home Medications Prior to Admission medications   Medication Sig Start Date End Date Taking? Authorizing Provider  bictegravir-emtricitabine-tenofovir AF (BIKTARVY) 50-200-25 MG TABS tablet Take 1 tablet by mouth daily. 04/12/23 08/10/23  Vu, Tonita Phoenix, MD  folic acid (FOLVITE) 1 MG tablet Take 1 tablet (1 mg total) by mouth daily. 10/23/22   Burnadette Pop, MD  levETIRAcetam (KEPPRA) 500 MG tablet Take 1 tablet (500 mg total) by mouth 2 (two) times daily. 12/27/22   Rhetta Mura, MD  thiamine (VITAMIN B1) 100 MG tablet Take 1 tablet (100 mg total) by mouth daily. 10/23/22   Burnadette Pop, MD  famotidine (PEPCID) 20 MG tablet Take 1 tablet (20 mg total) by mouth 2 (two) times daily. Patient not taking: Reported on 06/01/2019 05/14/19 06/02/19  Benjiman Core, MD      Allergies    Tegretol [carbamazepine] and Caffeine    Review of Systems   Review of Systems  Respiratory:  Negative for shortness of breath.   Cardiovascular:  Negative for chest pain.  Genitourinary:  Positive for decreased urine volume.  Musculoskeletal:  Positive for myalgias.    Physical Exam Updated Vital Signs BP (!) 120/95 (BP Location: Left Arm)   Pulse 100   Temp 98.6 F (37 C) (Oral)   Resp 18   Ht 6' (1.829 m)   Wt 67 kg   SpO2 95%   BMI 20.03 kg/m   Physical Exam Vitals and nursing note reviewed.  Constitutional:      General: He is not in acute distress.    Appearance: He is well-developed. He is not ill-appearing or diaphoretic.     Comments: intoxicated  HENT:     Head: Normocephalic and atraumatic.  Cardiovascular:     Rate and Rhythm: Normal rate and regular rhythm.     Heart sounds: Normal heart sounds.  Pulmonary:     Effort: Pulmonary effort is normal.     Breath sounds: Normal breath sounds.  Abdominal:     General: There is no distension.     Palpations: Abdomen is soft.     Tenderness: There is no abdominal tenderness.  Musculoskeletal:     Right upper leg: Tenderness present. No swelling.     Left upper leg: Tenderness present. No swelling.     Right knee: Normal range of motion.     Left knee: Normal range of motion.     Right lower leg: No swelling or tenderness.     Left lower leg: No swelling or tenderness.  Skin:    General: Skin is warm and dry.  Neurological:     Mental Status: He is alert.     ED Results / Procedures / Treatments   Labs (all labs ordered are  listed, but only abnormal results are displayed) Labs Reviewed - No data to display  EKG None  Radiology No results found.  Procedures Procedures    Medications Ordered in ED Medications  lactated ringers bolus 1,000 mL (has no administration in time range)    ED Course/ Medical Decision Making/ A&P                             Medical Decision Making Amount and/or Complexity of Data Reviewed Labs: ordered.    Details: CK minimally high.  Risk OTC drugs.   Patient appears intoxicated.  He will be given some ibuprofen for his leg pain which seems to be an acute on chronic problem.  No significant rhabdomyolysis.  Labs and metabolizing his alcohol are currently pending. Care transferred to Dr. Lynelle Doctor.        Final Clinical Impression(s) / ED Diagnoses Final diagnoses:  None    Rx / DC Orders ED Discharge Orders      None         Pricilla Loveless, MD 05/20/23 [phone]

## 2023-05-21 ENCOUNTER — Other Ambulatory Visit: Payer: Self-pay

## 2023-05-21 ENCOUNTER — Emergency Department (HOSPITAL_COMMUNITY)
Admission: EM | Admit: 2023-05-21 | Discharge: 2023-05-21 | Disposition: A | Discharge status: Home / Self Care | Payer: MEDICAID | Attending: Emergency Medicine | Admitting: Emergency Medicine

## 2023-05-21 ENCOUNTER — Other Ambulatory Visit (HOSPITAL_COMMUNITY): Payer: Self-pay

## 2023-05-21 ENCOUNTER — Emergency Department (HOSPITAL_COMMUNITY): Payer: MEDICAID

## 2023-05-21 ENCOUNTER — Encounter (HOSPITAL_COMMUNITY): Payer: Self-pay

## 2023-05-21 DIAGNOSIS — F10231 Alcohol dependence with withdrawal delirium: Secondary | ICD-10-CM | POA: Diagnosis not present

## 2023-05-21 DIAGNOSIS — Z20822 Contact with and (suspected) exposure to covid-19: Secondary | ICD-10-CM | POA: Diagnosis not present

## 2023-05-21 DIAGNOSIS — W01198A Fall on same level from slipping, tripping and stumbling with subsequent striking against other object, initial encounter: Secondary | ICD-10-CM | POA: Diagnosis not present

## 2023-05-21 DIAGNOSIS — F10930 Alcohol use, unspecified with withdrawal, uncomplicated: Secondary | ICD-10-CM

## 2023-05-21 DIAGNOSIS — Z21 Asymptomatic human immunodeficiency virus [HIV] infection status: Secondary | ICD-10-CM | POA: Diagnosis not present

## 2023-05-21 DIAGNOSIS — R569 Unspecified convulsions: Secondary | ICD-10-CM | POA: Diagnosis present

## 2023-05-21 LAB — COMPREHENSIVE METABOLIC PANEL
ALT: 36 U/L (ref 0–44)
AST: 54 U/L — ABNORMAL HIGH (ref 15–41)
Albumin: 4.2 g/dL (ref 3.5–5.0)
Alkaline Phosphatase: 76 U/L (ref 38–126)
Anion gap: 20 — ABNORMAL HIGH (ref 5–15)
BUN: 9 mg/dL (ref 6–20)
CO2: 17 mmol/L — ABNORMAL LOW (ref 22–32)
Calcium: 9 mg/dL (ref 8.9–10.3)
Chloride: 100 mmol/L (ref 98–111)
Creatinine, Ser: 0.68 mg/dL (ref 0.61–1.24)
GFR, Estimated: >60 mL/min (ref >60)
Glucose, Bld: 78 mg/dL (ref 70–99)
Potassium: 4 mmol/L (ref 3.5–5.1)
Sodium: 137 mmol/L (ref 135–145)
Total Bilirubin: 1.5 mg/dL — ABNORMAL HIGH (ref 0.3–1.2)
Total Protein: 7.4 g/dL (ref 6.5–8.1)

## 2023-05-21 LAB — CBC
HCT: 37.4 % — ABNORMAL LOW (ref 39.0–52.0)
Hemoglobin: 12.5 g/dL — ABNORMAL LOW (ref 13.0–17.0)
MCH: 32.4 pg (ref 26.0–34.0)
MCHC: 33.4 g/dL (ref 30.0–36.0)
MCV: 96.9 fL (ref 80.0–100.0)
Platelets: 71 10*3/uL — ABNORMAL LOW (ref 150–400)
RBC: 3.86 MIL/uL — ABNORMAL LOW (ref 4.22–5.81)
RDW: 14.3 % (ref 11.5–15.5)
WBC: 2.7 10*3/uL — ABNORMAL LOW (ref 4.0–10.5)
nRBC: 0 % (ref 0.0–0.2)

## 2023-05-21 LAB — TROPONIN I (HIGH SENSITIVITY)
Troponin I (High Sensitivity): 10 ng/L (ref <18)
Troponin I (High Sensitivity): 12 ng/L (ref <18)

## 2023-05-21 LAB — PHOSPHORUS: Phosphorus: 4.4 mg/dL (ref 2.5–4.6)

## 2023-05-21 LAB — CK: Total CK: 323 U/L (ref 49–397)

## 2023-05-21 LAB — CBG MONITORING, ED: Glucose-Capillary: 61 mg/dL — ABNORMAL LOW (ref 70–99)

## 2023-05-21 LAB — SARS CORONAVIRUS 2 BY RT PCR: SARS Coronavirus 2 by RT PCR: NEGATIVE

## 2023-05-21 LAB — LIPASE, BLOOD: Lipase: 33 U/L (ref 11–51)

## 2023-05-21 LAB — MAGNESIUM: Magnesium: 1.8 mg/dL (ref 1.7–2.4)

## 2023-05-21 MED ORDER — FAMOTIDINE IN NACL 20-0.9 MG/50ML-% IV SOLN
20.0000 mg | Freq: Once | INTRAVENOUS | Status: AC
  Administered 2023-05-21: 20 mg via INTRAVENOUS
  Filled 2023-05-21: qty 50

## 2023-05-21 MED ORDER — LORAZEPAM 1 MG PO TABS
1.0000 mg | ORAL_TABLET | ORAL | Status: DC | PRN
  Administered 2023-05-21: 2 mg via ORAL
  Filled 2023-05-21: qty 2

## 2023-05-21 MED ORDER — THIAMINE MONONITRATE 100 MG PO TABS
100.0000 mg | ORAL_TABLET | Freq: Every day | ORAL | Status: DC
  Administered 2023-05-21: 100 mg via ORAL
  Filled 2023-05-21: qty 1

## 2023-05-21 MED ORDER — ADULT MULTIVITAMIN W/MINERALS CH
1.0000 | ORAL_TABLET | Freq: Every day | ORAL | Status: DC
  Administered 2023-05-21: 1 via ORAL
  Filled 2023-05-21: qty 1

## 2023-05-21 MED ORDER — LEVETIRACETAM 500 MG PO TABS
500.0000 mg | ORAL_TABLET | Freq: Two times a day (BID) | ORAL | 11 refills | Status: DC
  Filled 2023-05-21: qty 60, 30d supply, fill #0

## 2023-05-21 MED ORDER — ONDANSETRON 8 MG PO TBDP: 8.0000 mg | ORAL_TABLET | Freq: Three times a day (TID) | ORAL | 0 refills | Status: DC | PRN

## 2023-05-21 MED ORDER — LORAZEPAM 1 MG PO TABS: 1.0000 mg | ORAL_TABLET | ORAL | Status: DC | PRN

## 2023-05-21 MED ORDER — SODIUM CHLORIDE 0.9 % IV BOLUS
1000.0000 mL | Freq: Once | INTRAVENOUS | Status: AC
  Administered 2023-05-21: 1000 mL via INTRAVENOUS

## 2023-05-21 MED ORDER — FOLIC ACID 1 MG PO TABS
1.0000 mg | ORAL_TABLET | Freq: Every day | ORAL | Status: DC
  Administered 2023-05-21: 1 mg via ORAL
  Filled 2023-05-21: qty 1

## 2023-05-21 MED ORDER — CHLORDIAZEPOXIDE HCL 25 MG PO CAPS: ORAL_CAPSULE | ORAL | 0 refills | Status: DC

## 2023-05-21 MED ORDER — THIAMINE HCL 100 MG/ML IJ SOLN
100.0000 mg | Freq: Every day | INTRAMUSCULAR | Status: DC
  Filled 2023-05-21: qty 2

## 2023-05-21 MED ORDER — LORAZEPAM 2 MG/ML IJ SOLN
2.0000 mg | Freq: Once | INTRAMUSCULAR | Status: AC
  Administered 2023-05-21: 2 mg via INTRAVENOUS
  Filled 2023-05-21: qty 1

## 2023-05-21 NOTE — ED Notes (Signed)
Pt ambulated to restroom, pt states he is going through withdrawals. Pt states his chest is hurting and feels like he can't breath. Pt states "if I keep going through this I am going to die"

## 2023-05-21 NOTE — ED Provider Notes (Signed)
Pt signed out by Dr. Karene Fry pending symptomatic improvement.  Pt had a seizure this am, likely due to med noncompliance with keppra and etoh w/d.  Pt is feeling much better after fluids and meds.  He is stable for d/c.  Return if worse. F/u with pcp.   Jacalyn Lefevre, MD 05/21/23 1050

## 2023-05-21 NOTE — ED Triage Notes (Signed)
Pt reports having multiple seizures outside on the bench after being discharged twice. Pt states that his body hurts all over.

## 2023-05-21 NOTE — ED Notes (Signed)
Pt declined departure vitals

## 2023-05-21 NOTE — ED Provider Notes (Signed)
[name] EMERGENCY DEPARTMENT AT [facility] Provider Note   CSN: [id number] Arrival date & time: 05/21/23  4098     History  Chief Complaint  Patient presents with   Seizures    Gary [name] is a 37 y.o. male.   Seizures    37 year old male with medical history significant for HIV, alcohol abuse, subdural hematoma, seizures, bipolar disorder, PTSD, alcohol induced pancreatitis presenting to the emergency department with likely alcohol withdrawal seizures.  The patient states that he was discharged from the emergency department last night.  He was waiting outside in the parking lot when he states that he had multiple generalized tonic-clonic seizures.  He feels tremulous and as if he is going into alcohol withdrawal.  He has a history of alcohol withdrawal seizures in the past.  He endorses diffuse whole body aches.  He states that he did fall and hit his head.  Home Medications Prior to Admission medications   Medication Sig Start Date End Date Taking? Authorizing Provider  bictegravir-emtricitabine-tenofovir AF (BIKTARVY) 50-200-25 MG TABS tablet Take 1 tablet by mouth daily. 04/12/23 08/10/23  Vu, Tonita Phoenix, MD  chlordiazePOXIDE (LIBRIUM) 25 MG capsule 50mg  PO TID x 1D, then 25-50mg  PO BID X 1D, then 25-50mg  PO QD X 1D 05/20/23   Linwood Dibbles, MD  folic acid (FOLVITE) 1 MG tablet Take 1 tablet (1 mg total) by mouth daily. 10/23/22   Burnadette Pop, MD  levETIRAcetam (KEPPRA) 500 MG tablet Take 1 tablet (500 mg total) by mouth 2 (two) times daily. 12/27/22   Rhetta Mura, MD  ondansetron (ZOFRAN-ODT) 8 MG disintegrating tablet Take 1 tablet (8 mg total) by mouth every 8 (eight) hours as needed for nausea or vomiting. 05/20/23   Linwood Dibbles, MD  thiamine (VITAMIN B1) 100 MG tablet Take 1 tablet (100 mg total) by mouth daily. 10/23/22   Burnadette Pop, MD  famotidine (PEPCID) 20 MG tablet Take 1 tablet (20 mg total) by mouth 2 (two) times daily. Patient not taking:  Reported on 06/01/2019 05/14/19 06/02/19  Benjiman Core, MD      Allergies    Tegretol [carbamazepine] and Caffeine    Review of Systems   Review of Systems  Neurological:  Positive for seizures.    Physical Exam Updated Vital Signs BP 127/88   Pulse (!) 103   Temp 98.8 F (37.1 C)   Resp 19   SpO2 96%  Physical Exam Vitals and nursing note reviewed.  Constitutional:      General: He is not in acute distress. HENT:     Head: Normocephalic and atraumatic.  Eyes:     Conjunctiva/sclera: Conjunctivae normal.     Pupils: Pupils are equal, round, and reactive to light.  Cardiovascular:     Rate and Rhythm: Normal rate and regular rhythm.  Pulmonary:     Effort: Pulmonary effort is normal. No respiratory distress.  Abdominal:     General: There is no distension.     Tenderness: There is no guarding.  Musculoskeletal:        General: No deformity or signs of injury.     Cervical back: Normal range of motion and neck supple.     Comments: No evidence of traumatic injury to the extremities, moving all 4 extremities  Skin:    Findings: No lesion or rash.  Neurological:     General: No focal deficit present.     Mental Status: He is alert. Mental status is at baseline.  Comments: No cranial nerve deficit, moving all 4 extremities     ED Results / Procedures / Treatments   Labs (all labs ordered are listed, but only abnormal results are displayed) Labs Reviewed  CBG MONITORING, ED - Abnormal; Notable for the following components:      Result Value   Glucose-Capillary 61 (*)    All other components within normal limits  SARS CORONAVIRUS 2 BY RT PCR  COMPREHENSIVE METABOLIC PANEL  MAGNESIUM  CBC  PHOSPHORUS  CK  LIPASE, BLOOD    EKG None  Radiology No results found.  Procedures Procedures    Medications Ordered in ED Medications  LORazepam (ATIVAN) tablet 1-4 mg (has no administration in time range)    Or  LORazepam (ATIVAN) tablet 1 mg (has no  administration in time range)  thiamine (VITAMIN B1) tablet 100 mg (has no administration in time range)    Or  thiamine (VITAMIN B1) injection 100 mg (has no administration in time range)  folic acid (FOLVITE) tablet 1 mg (has no administration in time range)  multivitamin with minerals tablet 1 tablet (has no administration in time range)    ED Course/ Medical Decision Making/ A&P Clinical Course as of 05/21/23 4098  Fri May 21, 2023  0607 Glucose-Capillary(!): 61 [JL]    Clinical Course User Index [JL] Ernie Avena, MD                             Medical Decision Making Amount and/or Complexity of Data Reviewed Labs: ordered. Decision-making details documented in ED Course.  Risk OTC drugs. Prescription drug management.    37 year old male with medical history significant for HIV, alcohol abuse, subdural hematoma, seizures, bipolar disorder, PTSD, alcohol induced pancreatitis presenting to the emergency department with likely alcohol withdrawal seizures.  The patient states that he was discharged from the emergency department last night.  He was waiting outside in the parking lot when he states that he had multiple generalized tonic-clonic seizures.  He feels tremulous and as if he is going into alcohol withdrawal.  He has a history of alcohol withdrawal seizures in the past.  He endorses diffuse whole body aches.  He states that he did fall and hit his head.  On arrival, the patient was afebrile, tachycardic with heart rate 103, BP 127/88, saturating 96% on room air.  Patient presents with likely alcohol withdrawal seizure.  Patient also hypoglycemic to 61 on arrival.  He was administered juice.  He states that he did fall and hit his head.  No obvious signs of trauma on physical exam.  Patient was placed on CIWA protocol, screening labs were obtained and a CT head was ordered.  Plan to reassess the patient following the above workup, patient states that he is amenable to admission  for alcohol withdrawal if this is indicated as he would like to quit.  Signout given to Dr. Particia Nearing at 0700.   Final Clinical Impression(s) / ED Diagnoses Final diagnoses:  Alcohol withdrawal syndrome without complication (HCC)  Alcohol withdrawal seizure without complication [facility])    Rx / DC Orders ED Discharge Orders     None         Ernie Avena, MD 05/21/23 [phone]

## 2023-05-21 NOTE — Progress Notes (Signed)
TOC consulted for Substance Abuse resources. Resources provided and attached to AVS. No further TOC needs.

## 2023-05-24 ENCOUNTER — Encounter (HOSPITAL_COMMUNITY): Payer: Self-pay | Admitting: *Deleted

## 2023-05-24 ENCOUNTER — Emergency Department (HOSPITAL_COMMUNITY): Payer: MEDICAID

## 2023-05-24 ENCOUNTER — Inpatient Hospital Stay (HOSPITAL_COMMUNITY)
Admission: EM | Admit: 2023-05-24 | Discharge: 2023-05-29 | DRG: 100 | Discharge status: Against Medical Advice | Payer: MEDICAID | Attending: Family Medicine | Admitting: Family Medicine

## 2023-05-24 ENCOUNTER — Other Ambulatory Visit: Payer: Self-pay

## 2023-05-24 DIAGNOSIS — F10229 Alcohol dependence with intoxication, unspecified: Secondary | ICD-10-CM | POA: Diagnosis present

## 2023-05-24 DIAGNOSIS — Z87891 Personal history of nicotine dependence: Secondary | ICD-10-CM

## 2023-05-24 DIAGNOSIS — E162 Hypoglycemia, unspecified: Secondary | ICD-10-CM | POA: Diagnosis present

## 2023-05-24 DIAGNOSIS — F419 Anxiety disorder, unspecified: Secondary | ICD-10-CM | POA: Diagnosis present

## 2023-05-24 DIAGNOSIS — Z91148 Patient's other noncompliance with medication regimen for other reason: Secondary | ICD-10-CM

## 2023-05-24 DIAGNOSIS — K861 Other chronic pancreatitis: Secondary | ICD-10-CM | POA: Diagnosis present

## 2023-05-24 DIAGNOSIS — Z79899 Other long term (current) drug therapy: Secondary | ICD-10-CM

## 2023-05-24 DIAGNOSIS — R569 Unspecified convulsions: Secondary | ICD-10-CM

## 2023-05-24 DIAGNOSIS — R000 Tachycardia, unspecified: Secondary | ICD-10-CM | POA: Diagnosis present

## 2023-05-24 DIAGNOSIS — E8721 Acute metabolic acidosis: Secondary | ICD-10-CM | POA: Diagnosis present

## 2023-05-24 DIAGNOSIS — M7989 Other specified soft tissue disorders: Secondary | ICD-10-CM | POA: Diagnosis present

## 2023-05-24 DIAGNOSIS — K852 Alcohol induced acute pancreatitis without necrosis or infection: Secondary | ICD-10-CM | POA: Diagnosis not present

## 2023-05-24 DIAGNOSIS — F209 Schizophrenia, unspecified: Secondary | ICD-10-CM | POA: Diagnosis present

## 2023-05-24 DIAGNOSIS — Z6820 Body mass index (BMI) 20.0-20.9, adult: Secondary | ICD-10-CM

## 2023-05-24 DIAGNOSIS — Z811 Family history of alcohol abuse and dependence: Secondary | ICD-10-CM | POA: Diagnosis not present

## 2023-05-24 DIAGNOSIS — Z888 Allergy status to other drugs, medicaments and biological substances status: Secondary | ICD-10-CM

## 2023-05-24 DIAGNOSIS — D6959 Other secondary thrombocytopenia: Secondary | ICD-10-CM | POA: Diagnosis not present

## 2023-05-24 DIAGNOSIS — F10239 Alcohol dependence with withdrawal, unspecified: Secondary | ICD-10-CM | POA: Diagnosis not present

## 2023-05-24 DIAGNOSIS — Z5329 Procedure and treatment not carried out because of patient's decision for other reasons: Secondary | ICD-10-CM | POA: Diagnosis present

## 2023-05-24 DIAGNOSIS — F1010 Alcohol abuse, uncomplicated: Secondary | ICD-10-CM

## 2023-05-24 DIAGNOSIS — Z5900 Homelessness unspecified: Secondary | ICD-10-CM | POA: Diagnosis not present

## 2023-05-24 DIAGNOSIS — K760 Fatty (change of) liver, not elsewhere classified: Secondary | ICD-10-CM | POA: Diagnosis not present

## 2023-05-24 DIAGNOSIS — Y908 Blood alcohol level of 240 mg/100 ml or more: Secondary | ICD-10-CM | POA: Diagnosis present

## 2023-05-24 DIAGNOSIS — F4310 Post-traumatic stress disorder, unspecified: Secondary | ICD-10-CM | POA: Diagnosis present

## 2023-05-24 DIAGNOSIS — E876 Hypokalemia: Secondary | ICD-10-CM | POA: Diagnosis present

## 2023-05-24 DIAGNOSIS — G40909 Epilepsy, unspecified, not intractable, without status epilepticus: Principal | ICD-10-CM

## 2023-05-24 DIAGNOSIS — Z21 Asymptomatic human immunodeficiency virus [HIV] infection status: Secondary | ICD-10-CM | POA: Diagnosis not present

## 2023-05-24 DIAGNOSIS — E43 Unspecified severe protein-calorie malnutrition: Secondary | ICD-10-CM | POA: Diagnosis not present

## 2023-05-24 DIAGNOSIS — R0682 Tachypnea, not elsewhere classified: Secondary | ICD-10-CM | POA: Diagnosis present

## 2023-05-24 DIAGNOSIS — E872 Acidosis, unspecified: Secondary | ICD-10-CM | POA: Diagnosis not present

## 2023-05-24 DIAGNOSIS — F3181 Bipolar II disorder: Secondary | ICD-10-CM | POA: Diagnosis present

## 2023-05-24 DIAGNOSIS — R9431 Abnormal electrocardiogram [ECG] [EKG]: Secondary | ICD-10-CM | POA: Diagnosis present

## 2023-05-24 DIAGNOSIS — Z8679 Personal history of other diseases of the circulatory system: Secondary | ICD-10-CM

## 2023-05-24 LAB — CBC WITH DIFFERENTIAL/PLATELET
Abs Immature Granulocytes: 0.02 10*3/uL (ref 0.00–0.07)
Basophils Absolute: 0.1 10*3/uL (ref 0.0–0.1)
Basophils Relative: 1 %
Eosinophils Absolute: 0 10*3/uL (ref 0.0–0.5)
Eosinophils Relative: 0 %
HCT: 43.5 % (ref 39.0–52.0)
Hemoglobin: 13.9 g/dL (ref 13.0–17.0)
Immature Granulocytes: 0 %
Lymphocytes Relative: 19 %
Lymphs Abs: 1.2 10*3/uL (ref 0.7–4.0)
MCH: 32 pg (ref 26.0–34.0)
MCHC: 32 g/dL (ref 30.0–36.0)
MCV: 100.2 fL — ABNORMAL HIGH (ref 80.0–100.0)
Monocytes Absolute: 0.3 10*3/uL (ref 0.1–1.0)
Monocytes Relative: 5 %
Neutro Abs: 4.9 10*3/uL (ref 1.7–7.7)
Neutrophils Relative %: 75 %
Platelets: 102 10*3/uL — ABNORMAL LOW (ref 150–400)
RBC: 4.34 MIL/uL (ref 4.22–5.81)
RDW: 14.7 % (ref 11.5–15.5)
WBC: 6.5 10*3/uL (ref 4.0–10.5)
nRBC: 0 % (ref 0.0–0.2)

## 2023-05-24 LAB — BASIC METABOLIC PANEL
Anion gap: 20 — ABNORMAL HIGH (ref 5–15)
BUN: 9 mg/dL (ref 6–20)
CO2: 13 mmol/L — ABNORMAL LOW (ref 22–32)
Calcium: 8.1 mg/dL — ABNORMAL LOW (ref 8.9–10.3)
Chloride: 102 mmol/L (ref 98–111)
Creatinine, Ser: 0.73 mg/dL (ref 0.61–1.24)
GFR, Estimated: >60 mL/min (ref >60)
Glucose, Bld: 168 mg/dL — ABNORMAL HIGH (ref 70–99)
Potassium: 4.3 mmol/L (ref 3.5–5.1)
Sodium: 135 mmol/L (ref 135–145)

## 2023-05-24 LAB — URINALYSIS, ROUTINE W REFLEX MICROSCOPIC
Bilirubin Urine: NEGATIVE
Glucose, UA: NEGATIVE mg/dL
Hgb urine dipstick: NEGATIVE
Ketones, ur: 80 mg/dL — AB
Leukocytes,Ua: NEGATIVE
Nitrite: NEGATIVE
Protein, ur: NEGATIVE mg/dL
Specific Gravity, Urine: 1.024 (ref 1.005–1.030)
pH: 5 (ref 5.0–8.0)

## 2023-05-24 LAB — RAPID URINE DRUG SCREEN, HOSP PERFORMED
Amphetamines: NOT DETECTED
Barbiturates: NOT DETECTED
Benzodiazepines: NOT DETECTED
Cocaine: NOT DETECTED
Opiates: NOT DETECTED
Tetrahydrocannabinol: NOT DETECTED

## 2023-05-24 LAB — MAGNESIUM
Magnesium: 1.9 mg/dL (ref 1.7–2.4)
Magnesium: 2.7 mg/dL — ABNORMAL HIGH (ref 1.7–2.4)

## 2023-05-24 LAB — PHOSPHORUS: Phosphorus: 4.1 mg/dL (ref 2.5–4.6)

## 2023-05-24 LAB — CK: Total CK: 242 U/L (ref 49–397)

## 2023-05-24 LAB — COMPREHENSIVE METABOLIC PANEL
ALT: 44 U/L (ref 0–44)
AST: 70 U/L — ABNORMAL HIGH (ref 15–41)
Albumin: 4.3 g/dL (ref 3.5–5.0)
Alkaline Phosphatase: 79 U/L (ref 38–126)
Anion gap: 23 — ABNORMAL HIGH (ref 5–15)
BUN: 16 mg/dL (ref 6–20)
CO2: 11 mmol/L — ABNORMAL LOW (ref 22–32)
Calcium: 8.2 mg/dL — ABNORMAL LOW (ref 8.9–10.3)
Chloride: 105 mmol/L (ref 98–111)
Creatinine, Ser: 0.81 mg/dL (ref 0.61–1.24)
GFR, Estimated: >60 mL/min (ref >60)
Glucose, Bld: 105 mg/dL — ABNORMAL HIGH (ref 70–99)
Potassium: 3.9 mmol/L (ref 3.5–5.1)
Sodium: 139 mmol/L (ref 135–145)
Total Bilirubin: 1.3 mg/dL — ABNORMAL HIGH (ref 0.3–1.2)
Total Protein: 7.7 g/dL (ref 6.5–8.1)

## 2023-05-24 LAB — LACTIC ACID, PLASMA
Lactic Acid, Venous: 6.6 mmol/L (ref 0.5–1.9)
Lactic Acid, Venous: 3.3 mmol/L (ref 0.5–1.9)
Lactic Acid, Venous: 2.8 mmol/L (ref 0.5–1.9)

## 2023-05-24 LAB — BETA-HYDROXYBUTYRIC ACID: Beta-Hydroxybutyric Acid: >8 mmol/L — ABNORMAL HIGH (ref 0.05–0.27)

## 2023-05-24 LAB — BLOOD GAS, VENOUS
Acid-base deficit: 12.6 mmol/L — ABNORMAL HIGH (ref 0.0–2.0)
Bicarbonate: 14.4 mmol/L — ABNORMAL LOW (ref 20.0–28.0)
O2 Saturation: 87.1 %
Patient temperature: 37
pCO2, Ven: 36 mmHg — ABNORMAL LOW (ref 44–60)
pH, Ven: 7.21 — ABNORMAL LOW (ref 7.25–7.43)
pO2, Ven: 57 mmHg — ABNORMAL HIGH (ref 32–45)

## 2023-05-24 LAB — TSH: TSH: 0.333 u[IU]/mL — ABNORMAL LOW (ref 0.350–4.500)

## 2023-05-24 LAB — LIPASE, BLOOD: Lipase: 808 U/L — ABNORMAL HIGH (ref 11–51)

## 2023-05-24 LAB — ETHANOL: Alcohol, Ethyl (B): 382 mg/dL (ref <10)

## 2023-05-24 LAB — SALICYLATE LEVEL: Salicylate Lvl: <7 mg/dL — ABNORMAL LOW (ref 7.0–30.0)

## 2023-05-24 LAB — CBG MONITORING, ED: Glucose-Capillary: 122 mg/dL — ABNORMAL HIGH (ref 70–99)

## 2023-05-24 MED ORDER — THIAMINE HCL 100 MG/ML IJ SOLN
100.0000 mg | Freq: Every day | INTRAMUSCULAR | Status: DC
  Filled 2023-05-24: qty 2

## 2023-05-24 MED ORDER — IOHEXOL 300 MG/ML  SOLN
100.0000 mL | Freq: Once | INTRAMUSCULAR | Status: AC | PRN
  Administered 2023-05-24: 100 mL via INTRAVENOUS

## 2023-05-24 MED ORDER — HYDROMORPHONE HCL 1 MG/ML IJ SOLN
0.5000 mg | Freq: Once | INTRAMUSCULAR | Status: AC | PRN
  Administered 2023-05-26: 0.5 mg via INTRAVENOUS

## 2023-05-24 MED ORDER — ONDANSETRON HCL 4 MG/2ML IJ SOLN: 4.0000 mg | Freq: Once | INTRAMUSCULAR | Status: DC

## 2023-05-24 MED ORDER — LEVETIRACETAM IN NACL 1500 MG/100ML IV SOLN
1500.0000 mg | Freq: Once | INTRAVENOUS | Status: AC
  Administered 2023-05-24: 1500 mg via INTRAVENOUS
  Filled 2023-05-24: qty 100

## 2023-05-24 MED ORDER — SUCRALFATE 1 GM/10ML PO SUSP
1.0000 g | Freq: Three times a day (TID) | ORAL | Status: DC
  Administered 2023-05-24 – 2023-05-29 (×12): 1 g via ORAL
  Filled 2023-05-24 (×13): qty 10

## 2023-05-24 MED ORDER — LACTATED RINGERS IV SOLN: INTRAVENOUS | Status: DC

## 2023-05-24 MED ORDER — FAMOTIDINE IN NACL 20-0.9 MG/50ML-% IV SOLN
20.0000 mg | Freq: Once | INTRAVENOUS | Status: AC
  Administered 2023-05-24: 20 mg via INTRAVENOUS
  Filled 2023-05-24: qty 50

## 2023-05-24 MED ORDER — PANTOPRAZOLE SODIUM 40 MG IV SOLR
40.0000 mg | Freq: Two times a day (BID) | INTRAVENOUS | Status: DC
  Administered 2023-05-24 – 2023-05-29 (×10): 40 mg via INTRAVENOUS
  Filled 2023-05-24 (×10): qty 10

## 2023-05-24 MED ORDER — LORAZEPAM 2 MG/ML IJ SOLN
1.0000 mg | INTRAMUSCULAR | Status: AC | PRN
  Administered 2023-05-24: 3 mg via INTRAVENOUS
  Administered 2023-05-25: 4 mg via INTRAVENOUS
  Administered 2023-05-25 (×3): 2 mg via INTRAVENOUS
  Administered 2023-05-26: 1 mg via INTRAVENOUS
  Administered 2023-05-27: 3 mg via INTRAVENOUS
  Administered 2023-05-27: 2 mg via INTRAVENOUS
  Administered 2023-05-27: 1 mg via INTRAVENOUS
  Administered 2023-05-27 – 2023-05-28 (×7): 2 mg via INTRAVENOUS
  Administered 2023-05-28: 3 mg via INTRAVENOUS
  Filled 2023-05-24 (×2): qty 1
  Filled 2023-05-24 (×2): qty 2
  Filled 2023-05-24: qty 1
  Filled 2023-05-24 (×2): qty 2
  Filled 2023-05-24 (×6): qty 1
  Filled 2023-05-24: qty 2
  Filled 2023-05-24 (×4): qty 1

## 2023-05-24 MED ORDER — LEVETIRACETAM IN NACL 500 MG/100ML IV SOLN
500.0000 mg | Freq: Two times a day (BID) | INTRAVENOUS | Status: DC
  Administered 2023-05-24 – 2023-05-28 (×9): 500 mg via INTRAVENOUS
  Filled 2023-05-24 (×11): qty 100

## 2023-05-24 MED ORDER — ALUM & MAG HYDROXIDE-SIMETH 200-200-20 MG/5ML PO SUSP
30.0000 mL | Freq: Four times a day (QID) | ORAL | Status: DC | PRN
  Filled 2023-05-24: qty 30

## 2023-05-24 MED ORDER — HYDROMORPHONE HCL 1 MG/ML IJ SOLN
0.5000 mg | INTRAMUSCULAR | Status: DC | PRN
  Administered 2023-05-24 – 2023-05-29 (×21): 0.5 mg via INTRAVENOUS
  Filled 2023-05-24 (×22): qty 0.5

## 2023-05-24 MED ORDER — ADULT MULTIVITAMIN W/MINERALS CH
1.0000 | ORAL_TABLET | Freq: Every day | ORAL | Status: DC
  Administered 2023-05-24 – 2023-05-29 (×5): 1 via ORAL
  Filled 2023-05-24 (×5): qty 1

## 2023-05-24 MED ORDER — LACTATED RINGERS IV SOLN: INTRAVENOUS | Status: AC

## 2023-05-24 MED ORDER — DEXTROSE 5 % IV SOLN: Freq: Once | INTRAVENOUS | Status: AC

## 2023-05-24 MED ORDER — ALBUTEROL SULFATE (2.5 MG/3ML) 0.083% IN NEBU: 2.5000 mg | INHALATION_SOLUTION | RESPIRATORY_TRACT | Status: DC | PRN

## 2023-05-24 MED ORDER — SODIUM BICARBONATE 8.4 % IV SOLN
INTRAVENOUS | Status: DC
  Filled 2023-05-24: qty 150

## 2023-05-24 MED ORDER — LACTATED RINGERS IV BOLUS
1000.0000 mL | Freq: Once | INTRAVENOUS | Status: AC
  Administered 2023-05-24: 1000 mL via INTRAVENOUS

## 2023-05-24 MED ORDER — FOLIC ACID 1 MG PO TABS
1.0000 mg | ORAL_TABLET | Freq: Every day | ORAL | Status: DC
  Administered 2023-05-24 – 2023-05-29 (×5): 1 mg via ORAL
  Filled 2023-05-24 (×5): qty 1

## 2023-05-24 MED ORDER — MAGNESIUM SULFATE 4 GM/100ML IV SOLN
4.0000 g | Freq: Once | INTRAVENOUS | Status: AC
  Administered 2023-05-24: 4 g via INTRAVENOUS
  Filled 2023-05-24: qty 100

## 2023-05-24 MED ORDER — THIAMINE MONONITRATE 100 MG PO TABS
100.0000 mg | ORAL_TABLET | Freq: Every day | ORAL | Status: DC
  Administered 2023-05-24: 100 mg via ORAL
  Filled 2023-05-24: qty 1

## 2023-05-24 MED ORDER — LORAZEPAM 2 MG/ML IJ SOLN
1.0000 mg | Freq: Once | INTRAMUSCULAR | Status: AC
  Administered 2023-05-24: 1 mg via INTRAVENOUS
  Filled 2023-05-24: qty 1

## 2023-05-24 MED ORDER — LORAZEPAM 1 MG PO TABS
1.0000 mg | ORAL_TABLET | ORAL | Status: AC | PRN
  Administered 2023-05-26: 3 mg via ORAL
  Filled 2023-05-24: qty 4
  Filled 2023-05-24: qty 3

## 2023-05-24 MED ORDER — MORPHINE SULFATE (PF) 4 MG/ML IV SOLN
4.0000 mg | Freq: Once | INTRAVENOUS | Status: AC
  Administered 2023-05-24: 4 mg via INTRAVENOUS
  Filled 2023-05-24: qty 1

## 2023-05-24 NOTE — ED Provider Notes (Signed)
[place] [facility] AT [facility] Provider Note   CSN: [id number] Arrival date & time: 05/24/23  1214     History  No chief complaint on file.   Gary [name] is a 37 y.o. male.  With a history of HIV, bipolar 2 disorder, PTSD, anxiety, schizophrenia, alcohol abuse who presents to the ED via EMS for evaluation of a potential seizure.  EMS reports that patient was sitting on a bench in a park with a friend when the friend noticed the patient having seizure-like activity.  No reports of a fall.  When asked, patient is unsure if he had a seizure.  When asked how much he has had to drink today, he states "not enough."  Currently complaining of generalized abdominal pain, nausea and vomiting.  He is unsure if he is taking his home medications.    HPI     Home Medications Prior to Admission medications   Medication Sig Start Date End Date Taking? Authorizing Provider  bictegravir-emtricitabine-tenofovir AF (BIKTARVY) 50-200-25 MG TABS tablet Take 1 tablet by mouth daily. 04/12/23 08/10/23  Vu, Tonita Phoenix, MD  chlordiazePOXIDE (LIBRIUM) 25 MG capsule Take 2 capsules (50 mg total) by mouth 3 (three) times daily for 1 day, THEN 1-2 capsules (25-50 mg total) 2 (two) times daily for 1 day, THEN 1-2 capsules (25-50 mg total) daily for 1 day. 05/20/23 05/24/23  Linwood Dibbles, MD  folic acid (FOLVITE) 1 MG tablet Take 1 tablet (1 mg total) by mouth daily. 10/23/22   Burnadette Pop, MD  levETIRAcetam (KEPPRA) 500 MG tablet Take 1 tablet (500 mg total) by mouth 2 (two) times daily. 05/21/23   Jacalyn Lefevre, MD  ondansetron (ZOFRAN-ODT) 8 MG disintegrating tablet Take 1 tablet by mouth every 8 hours as needed for nausea or vomiting. 05/21/23   Jacalyn Lefevre, MD  thiamine (VITAMIN B1) 100 MG tablet Take 1 tablet (100 mg total) by mouth daily. 10/23/22   Burnadette Pop, MD  famotidine (PEPCID) 20 MG tablet Take 1 tablet (20 mg total) by mouth 2 (two) times daily. Patient not taking:  Reported on 06/01/2019 05/14/19 06/02/19  Benjiman Core, MD      Allergies    Tegretol [carbamazepine] and Caffeine    Review of Systems   Review of Systems  Reason unable to perform ROS: intoxication.  Gastrointestinal:  Positive for abdominal pain, nausea and vomiting.  Neurological:  Positive for seizures.    Physical Exam Updated Vital Signs BP 127/82   Pulse (!) 107   Temp 97.7 F (36.5 C) (Oral)   Resp (!) 30   Ht 6' (1.829 m)   Wt 67 kg   SpO2 97%   BMI 20.03 kg/m  Physical Exam Vitals and nursing note reviewed.  Constitutional:      General: He is in acute distress.     Appearance: He is well-developed. He is not ill-appearing.     Comments: disheveled  HENT:     Head: Normocephalic and atraumatic.  Eyes:     Conjunctiva/sclera: Conjunctivae normal.  Cardiovascular:     Rate and Rhythm: Normal rate and regular rhythm.     Heart sounds: No murmur heard. Pulmonary:     Effort: Pulmonary effort is normal. No respiratory distress.     Breath sounds: Normal breath sounds.  Abdominal:     Palpations: Abdomen is soft.     Tenderness: There is abdominal tenderness (generalized).  Musculoskeletal:        General: No swelling.  Cervical back: Neck supple.  Skin:    General: Skin is warm and dry.     Capillary Refill: Capillary refill takes less than 2 seconds.  Neurological:     Comments: Drowsy, slow to answer questions but answers appropriately     ED Results / Procedures / Treatments   Labs (all labs ordered are listed, but only abnormal results are displayed) Labs Reviewed  COMPREHENSIVE METABOLIC PANEL - Abnormal; Notable for the following components:      Result Value   CO2 11 (*)    Glucose, Bld 105 (*)    Calcium 8.2 (*)    AST 70 (*)    Total Bilirubin 1.3 (*)    Anion gap 23 (*)    All other components within normal limits  ETHANOL - Abnormal; Notable for the following components:   Alcohol, Ethyl (B) 382 (*)    All other components  within normal limits  CBC WITH DIFFERENTIAL/PLATELET - Abnormal; Notable for the following components:   MCV 100.2 (*)    Platelets 102 (*)    All other components within normal limits  LIPASE, BLOOD - Abnormal; Notable for the following components:   Lipase 808 (*)    All other components within normal limits  LACTIC ACID, PLASMA - Abnormal; Notable for the following components:   Lactic Acid, Venous 6.6 (*)    All other components within normal limits  BETA-HYDROXYBUTYRIC ACID - Abnormal; Notable for the following components:   Beta-Hydroxybutyric Acid >8.00 (*)    All other components within normal limits  CBG MONITORING, ED - Abnormal; Notable for the following components:   Glucose-Capillary 122 (*)    All other components within normal limits  CK  MAGNESIUM  LACTIC ACID, PLASMA  URINALYSIS, ROUTINE W REFLEX MICROSCOPIC  RAPID URINE DRUG SCREEN, HOSP PERFORMED    EKG EKG Interpretation Date/Time:  Monday May 24 2023 12:26:01 EDT Ventricular Rate:  93 PR Interval:  140 QRS Duration:  111 QT Interval:  501 QTC Calculation: 624 R Axis:   78  Text Interpretation: Sinus rhythm Atrial premature complex Probable left ventricular hypertrophy Prolonged QT interval QT has lengthened since last EKG Confirmed by Elayne Snare ([id number]) on 05/24/2023 12:51:39 PM  Radiology No results found.  Procedures Procedures    Medications Ordered in ED Medications  lactated ringers bolus 1,000 mL (1,000 mLs Intravenous New Bag/Given 05/24/23 1308)  levETIRAcetam (KEPPRA) IVPB 1500 mg/ 100 mL premix (0 mg Intravenous Stopped 05/24/23 1351)  famotidine (PEPCID) IVPB 20 mg premix (0 mg Intravenous Stopped 05/24/23 1410)  dextrose 5 % solution ( Intravenous New Bag/Given 05/24/23 1439)  morphine (PF) 4 MG/ML injection 4 mg (4 mg Intravenous Given 05/24/23 1525)    ED Course/ Medical Decision Making/ A&P                             Medical Decision Making Amount and/or Complexity of Data  Reviewed Labs: ordered. Radiology: ordered.  Risk Prescription drug management.  This patient presents to the ED for concern of seizures, abdominal pain, this involves an extensive number of treatment options, and is a complaint that carries with it a high risk of complications and morbidity.  The differential diagnosis for seizures includes but is not limited to idiopathic seizure, traumatic brain injury, intracranial hemorrhage, vascular lesion, mass or space containing lesion, degenerative neurologic disease, congenital brain abnormality, infectious etiology such as meningitis, encephalitis or abscess, metabolic disturbance including hyper or  hypoglycemia, hyper or hyponatremia, hyperosmolar state, uremia, hepatic failure, hypocalcemia, hypomagnesemia.  Toxic substances such as cocaine, lidocaine, antidepressants, theophylline, alcohol withdrawal, drug withdrawal, eclampsia, hypertensive encephalopathy and anoxic brain injury.   Co morbidities that complicate the patient evaluation   HIV, bipolar 2 disorder, PTSD, anxiety, schizophrenia, alcohol abuse  My initial workup includes labs, EKG, fluids, Keppra loading dose, Pepcid  Additional history obtained from: Nursing notes from this visit. Previous records within EMR 20 ED visits in the past 6 months with admissions for similar on 12/14/2022, 12/22/2022 EMS provides a portion of the history  I ordered, reviewed and interpreted labs which include: CBC, CMP, lipase, ethanol, CK, lactic acid, beta hydroxybutyrate, magnesium.  CMP significant for bicarb of 11 with an anion gap of 23.  This may be secondary to alcoholic ketosis.  CBC is within normal limits.  Alcohol level 382.  Lipase of 808.  Initial lactate of 6.6.  Cardiac Monitoring:  The patient was maintained on a cardiac monitor.  I personally viewed and interpreted the cardiac monitored which showed an underlying rhythm of: Borderline sinus tachycardia  Consultations Obtained:  I  requested consultation with the hospitalist  Afebrile, borderline tachycardia but otherwise hemodynamically stable.  37 year old male presenting to the ED via EMS for evaluation of potential seizure-like activity.  On initial exam, patient is drowsy and slow to answer questions.  He chooses not to answer many of my questions.  He states his abdomen hurts and he feels nauseous.  I observe the patient vomiting.  Patient is acutely altered and his EKG shows a QTc of 600 making antiemetics and benzodiazepines dangerous.  Lab workup significant for a anion gap of 23 significantly elevated alcohol level of 382, lipase of 808, initial lactate of 6.6.  Patient likely did have a seizure.  This is likely secondary to medication noncompliance.  Patient does appear to have acute alcohol induced pancreatitis.  Overall given patient's multiple metabolic derangements and poor clinical picture, believe he would benefit from admission to hospitalist service for continued management. Care will be handed off to oncoming provider pending hospitalist consult. Plan at the time of shift change is admission to hospital for acidosis, seizure, pancreatitis. Please see his note for final disposition and decision making.  Patient's case discussed with Dr. Theresia Lo who agrees with plan to admit.   Note: Portions of this report may have been transcribed using voice recognition software. Every effort was made to ensure accuracy; however, inadvertent computerized transcription errors may still be present.        Final Clinical Impression(s) / ED Diagnoses Final diagnoses:  Alcohol-induced acute pancreatitis, unspecified complication status  Acute alcohol intoxication in patient with alcoholism with blood alcohol level over 0.3, with unspecified complication [facility])  Seizure [facility])    Rx / DC Orders ED Discharge Orders     None         Michelle Piper, PA-C 05/24/23 1536    Elayne Snare K, DO 05/24/23  1629

## 2023-05-24 NOTE — ED Provider Notes (Signed)
Patient is a handoff from [place], [place]. Patient is very sick appearing. Had witnessed seizure activity likely medication non-compliance. Will need admission for acute alcohol induced pancreatitis and breakthrough seizure likely induced due to significant alcohol consumption. On CIWA protocols. Physical Exam  BP 127/82   Pulse (!) 107   Temp 97.7 F (36.5 C) (Oral)   Resp (!) 25   Ht 6' (1.829 m)   Wt 67 kg   SpO2 97%   BMI 20.03 kg/m   Physical Exam Vitals and nursing note reviewed.  Constitutional:      General: He is in acute distress.     Appearance: He is well-developed. He is not ill-appearing.     Comments: disheveled  HENT:     Head: Normocephalic and atraumatic.  Eyes:     Conjunctiva/sclera: Conjunctivae normal.  Cardiovascular:     Rate and Rhythm: Normal rate and regular rhythm.     Heart sounds: No murmur heard. Pulmonary:     Effort: Pulmonary effort is normal. No respiratory distress.     Breath sounds: Normal breath sounds.  Abdominal:     Palpations: Abdomen is soft.     Tenderness: There is abdominal tenderness (generalized).  Musculoskeletal:        General: No swelling.     Cervical back: Neck supple.  Skin:    General: Skin is warm and dry.     Capillary Refill: Capillary refill takes less than 2 seconds.  Neurological:     Comments: Drowsy, slow to answer questions but answers appropriately     Procedures  Procedures  ED Course / MDM    Medical Decision Making Amount and/or Complexity of Data Reviewed Labs: ordered. Radiology: ordered.  Risk Prescription drug management. Decision regarding hospitalization.   Patient is a handoff from [place], [place]. Patient is very sick appearing. Had witnessed seizure activity likely medication non-compliance. Will need admission for acute alcohol induced pancreatitis and breakthrough seizure likely induced due to significant alcohol consumption. On CIWA protocols. CT abdomen pelvis showing acute  pancreatitis without obvious changes such as necrosis or pseudocyst at this time.  CT head pending per request of hospitalist. Will be admitted by Dr. Maisie Fus. Patient agreeable with plan for admission.       Smitty Knudsen, PA-C 05/24/23 1703    Wynetta Fines, MD 05/24/23 2251

## 2023-05-24 NOTE — H&P (Signed)
History and Physical    Gary [name] GMW:[id number] DOB: 1986-02-08 DOA: 05/24/2023  PCP: Patient, No Pcp Per  Patient coming from: home  I have personally briefly reviewed patient's old medical records in [facility]  Chief Complaint: seizure episode  HPI: Gary [name] is a 37 y.o. male with medical history significant of  HIV followed by ID Dr Renold Don, Seizure d/o on keppra, severe etoh abuse with history of seizure during withdrawal and frequent admits to ED for ETOH intoxication/withdrawal PTSD, bipolar II,Schizophrenia, Anxiety, hx of Subdural hematoma, history of etoh induced pancreatitis who presents to ED BIB EMS s/p seizure episode. In the field CBG:63 repeat in ED 97.  Patient in ED also with complaints of abdominal pain. Patient states abdominal pain started today. He notes no vomiting but notes nausea and dry heaving. He notes last drink was less than hours prior to hospital admission.  He notes no sob/ chest pain / notes intermittent diarrhea , vague answer to history of dark stool  so unclear if he has has dark stools.  He does note last seizure before his presenting episode was one day ago. He states he has not been able to get his medications and has been out of medications x 3 days.   ED Course:  Vitals: afeb, bp 114/68, hr 92-125, rr 32, sat 96% on ra  Labs wbc  6.5, hgb 13.9, plt 102 (71) Na 139, K 3.9, CL 105, bicarb 11 (17) glu 105 AST 70  Tbili AG 23 CK 242 Mag 1.9 Lipase 808 Lactic 6.6 Beta hydroxybutyric acid >8 Vbg: 7.21/ co2 36, bicarb 11   EKG: Sinus rhythm  PAC , probable LVH  , prolonged QT 624 CTH IMPRESSION: No CT evidence for acute intracranial abnormality. Slight limitation secondary to previously administered contrast media  CTAB/Pelvis IMPRESSION: Changes of acute pancreatitis. No evidence of pancreatic necrosis or developing pseudocyst currently.   Tx LR 1L, keppra 1500, pepcid iv, morphine Review of Systems: As per HPI otherwise  10 point review of systems negative.   Past Medical History:  Diagnosis Date   Alcohol abuse    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   Anxiety    Bipolar 2 disorder (HCC)    HIV (human immunodeficiency virus infection) (HCC)    Pancreatitis    PTSD (post-traumatic stress disorder)    Schizophrenia (HCC)    Seizures (HCC)    Subdural hematoma (HCC)     Past Surgical History:  Procedure Laterality Date   BIOPSY  04/19/2022   Procedure: BIOPSY;  Surgeon: Lemar Lofty., MD;  Location: [facility] ENDOSCOPY;  Service: Gastroenterology;;   ENTEROSCOPY N/A 04/19/2022   Procedure: ENTEROSCOPY;  Surgeon: Lemar Lofty., MD;  Location: [facility] ENDOSCOPY;  Service: Gastroenterology;  Laterality: N/A;   INCISION AND DRAINAGE PERIRECTAL ABSCESS N/A 09/24/2016   Procedure: IRRIGATION AND DEBRIDEMENT PERIRECTAL ABSCESS;  Surgeon: Ricarda Frame, MD;  Location: [facility] ORS;  Service: General;  Laterality: N/A;   none       reports that he has been smoking cigarettes. He has never used smokeless tobacco. He reports current alcohol use of about 105.0 standard drinks of alcohol per week. He reports current drug use. Drug: Methamphetamines.  Allergies  Allergen Reactions   Tegretol [Carbamazepine] Other (See Comments)    Caused vertigo for 2 days after taking it   Caffeine Palpitations    Family History  Problem Relation Age of Onset   Alcohol abuse Mother    Alcohol abuse Father  Colon cancer Other    Other Other    Cancer Other     Prior to Admission medications   Medication Sig Start Date End Date Taking? Authorizing Provider  bictegravir-emtricitabine-tenofovir AF (BIKTARVY) 50-200-25 MG TABS tablet Take 1 tablet by mouth daily. 04/12/23 08/10/23  Vu, Tonita Phoenix, MD  chlordiazePOXIDE (LIBRIUM) 25 MG capsule Take 2 capsules (50 mg total) by mouth 3 (three) times daily for 1 day, THEN 1-2 capsules (25-50 mg total) 2 (two) times daily for 1 day, THEN 1-2 capsules (25-50 mg total) daily  for 1 day. 05/20/23 05/24/23  Linwood Dibbles, MD  folic acid (FOLVITE) 1 MG tablet Take 1 tablet (1 mg total) by mouth daily. 10/23/22   Burnadette Pop, MD  levETIRAcetam (KEPPRA) 500 MG tablet Take 1 tablet (500 mg total) by mouth 2 (two) times daily. 05/21/23   Jacalyn Lefevre, MD  ondansetron (ZOFRAN-ODT) 8 MG disintegrating tablet Take 1 tablet by mouth every 8 hours as needed for nausea or vomiting. 05/21/23   Jacalyn Lefevre, MD  thiamine (VITAMIN B1) 100 MG tablet Take 1 tablet (100 mg total) by mouth daily. 10/23/22   Burnadette Pop, MD  famotidine (PEPCID) 20 MG tablet Take 1 tablet (20 mg total) by mouth 2 (two) times daily. Patient not taking: Reported on 06/01/2019 05/14/19 06/02/19  Benjiman Core, MD    Physical Exam: Vitals:   05/24/23 1445 05/24/23 1500 05/24/23 1515 05/24/23 1530  BP: 122/80 124/83 126/87 127/82  Pulse: (!) 104 (!) 102 (!) 104 (!) 107  Resp:    (!) 25  Temp:      TempSrc:      SpO2: 97% 99% 100% 97%  Weight:      Height:        Constitutional: NAD, calm, comfortable Vitals:   05/24/23 1445 05/24/23 1500 05/24/23 1515 05/24/23 1530  BP: 122/80 124/83 126/87 127/82  Pulse: (!) 104 (!) 102 (!) 104 (!) 107  Resp:    (!) 25  Temp:      TempSrc:      SpO2: 97% 99% 100% 97%  Weight:      Height:       Eyes: PERRL, lids and conjunctivae normal ENMT: Mucous membranes are moist. Posterior pharynx clear of any exudate or lesions.Normal dentition.  Neck: normal, supple, no masses, no thyromegaly Respiratory: clear to auscultation bilaterally, no wheezing, no crackles. Normal respiratory effort. No accessory muscle use.  Cardiovascular: Regular rate and rhythm, no murmurs / rubs / gallops. No extremity edema. 2+ pedal pulses. No carotid bruits.  Abdomen: no tenderness, no masses palpated. No hepatosplenomegaly. Bowel sounds positive.  Musculoskeletal: no clubbing / cyanosis. No joint deformity upper and lower extremities. Good ROM, no contractures. Normal muscle  tone.  Skin: no rashes, lesions, ulcers. No induration Neurologic: CN 2-12 grossly intact. Sensation intact, DTR normal. Strength 5/5 in all 4.  Psychiatric: Normal judgment and insight. Alert and oriented x 3. Normal mood.    Labs on Admission: I have personally reviewed following labs and imaging studies  CBC: Recent Labs  Lab 05/20/23 1440 05/21/23 0658 05/24/23 1315  WBC 2.6* 2.7* 6.5  NEUTROABS 1.1*  --  4.9  HGB 13.5 12.5* 13.9  HCT 39.9 37.4* 43.5  MCV 95.2 96.9 100.2*  PLT 79* 71* 102*   Basic Metabolic Panel: Recent Labs  Lab 05/20/23 1440 05/21/23 0658 05/24/23 1315  NA 140 137 139  K 3.8 4.0 3.9  CL 105 100 105  CO2 23 17* 11*  GLUCOSE  92 78 105*  BUN 6 9 16   CREATININE 0.56* 0.68 0.81  CALCIUM 8.6* 9.0 8.2*  MG  --  1.8 1.9  PHOS  --  4.4  --    GFR: Estimated Creatinine Clearance: 119.5 mL/min (by C-G formula based on SCr of 0.81 mg/dL). Liver Function Tests: Recent Labs  Lab 05/20/23 1440 05/21/23 0658 05/24/23 1315  AST 57* 54* 70*  ALT 36 36 44  ALKPHOS 78 76 79  BILITOT 0.7 1.5* 1.3*  PROT 7.7 7.4 7.7  ALBUMIN 4.1 4.2 4.3   Recent Labs  Lab 05/21/23 0658 05/24/23 1315  LIPASE 33 808*   No results for input(s): "AMMONIA" in the last 168 hours. Coagulation Profile: No results for input(s): "INR", "PROTIME" in the last 168 hours. Cardiac Enzymes: Recent Labs  Lab 05/20/23 1440 05/21/23 0658 05/24/23 1315  CKTOTAL 418* 323 242   BNP (last 3 results) No results for input(s): "PROBNP" in the last 8760 hours. HbA1C: No results for input(s): "HGBA1C" in the last 72 hours. CBG: Recent Labs  Lab 05/21/23 0557 05/24/23 1221  GLUCAP 61* 122*   Lipid Profile: No results for input(s): "CHOL", "HDL", "LDLCALC", "TRIG", "CHOLHDL", "LDLDIRECT" in the last 72 hours. Thyroid Function Tests: No results for input(s): "TSH", "T4TOTAL", "FREET4", "T3FREE", "THYROIDAB" in the last 72 hours. Anemia Panel: No results for input(s):  "VITAMINB12", "FOLATE", "FERRITIN", "TIBC", "IRON", "RETICCTPCT" in the last 72 hours. Urine analysis:    Component Value Date/Time   COLORURINE YELLOW 01/03/2023 0338   APPEARANCEUR CLEAR 01/03/2023 0338   APPEARANCEUR Clear 11/15/2014 1755   LABSPEC 1.016 01/03/2023 0338   LABSPEC 1.002 11/15/2014 1755   PHURINE 5.0 01/03/2023 0338   GLUCOSEU NEGATIVE 01/03/2023 0338   GLUCOSEU Negative 11/15/2014 1755   HGBUR NEGATIVE 01/03/2023 0338   BILIRUBINUR NEGATIVE 01/03/2023 0338   BILIRUBINUR Negative 11/15/2014 1755   KETONESUR NEGATIVE 01/03/2023 0338   PROTEINUR NEGATIVE 01/03/2023 0338   NITRITE NEGATIVE 01/03/2023 0338   LEUKOCYTESUR NEGATIVE 01/03/2023 0338   LEUKOCYTESUR Negative 11/15/2014 1755    Radiological Exams on Admission: CT Head Wo Contrast  Result Date: 05/24/2023 CLINICAL DATA:  Mental status change seizure EXAM: CT HEAD WITHOUT CONTRAST TECHNIQUE: Contiguous axial images were obtained from the base of the skull through the vertex without intravenous contrast. RADIATION DOSE REDUCTION: This exam was performed according to the departmental dose-optimization program which includes automated exposure control, adjustment of the mA and/or kV according to patient size and/or use of iterative reconstruction technique. COMPARISON:  CT 05/21/2023 FINDINGS: Brain: No acute territorial infarction, hemorrhage or intracranial mass. Nonenlarged ventricles Vascular: Contrast media within the intracranial vessels and dural venous sinuses from recently performed contrast-enhanced examination. Skull: Normal. Negative for fracture or focal lesion. Sinuses/Orbits: No acute finding. Other: None IMPRESSION: No CT evidence for acute intracranial abnormality. Slight limitation secondary to previously administered contrast media Electronically Signed   By: Jasmine Pang M.D.   On: 05/24/2023 16:22   CT ABDOMEN PELVIS W CONTRAST  Result Date: 05/24/2023 CLINICAL DATA:  Pancreatitis, acute, severe  EXAM: CT ABDOMEN AND PELVIS WITH CONTRAST TECHNIQUE: Multidetector CT imaging of the abdomen and pelvis was performed using the standard protocol following bolus administration of intravenous contrast. RADIATION DOSE REDUCTION: This exam was performed according to the departmental dose-optimization program which includes automated exposure control, adjustment of the mA and/or kV according to patient size and/or use of iterative reconstruction technique. CONTRAST:  OMNIPAQUE IOHEXOL 300 MG/ML  SOLN COMPARISON:  12/22/2022 FINDINGS: Lower chest: No acute  abnormality Hepatobiliary: Severe diffuse fatty infiltration of the liver. Gallbladder is distended. No visible stones or biliary ductal dilatation. Pancreas: No focal pancreatic mass or ductal dilatation. There is truncation of the pancreatic tail. No visible mass lesion. Surrounding inflammation around the pancreas compatible with acute pancreatitis. Spleen: No focal abnormality.  Normal size. Adrenals/Urinary Tract: No adrenal abnormality. No focal renal abnormality. No stones or hydronephrosis. Urinary bladder is unremarkable. Stomach/Bowel: Normal appendix. Stomach, large and small bowel grossly unremarkable. Vascular/Lymphatic: No evidence of aneurysm or adenopathy. Reproductive: No visible focal abnormality. Other: No free fluid or free air. Musculoskeletal: No acute bony abnormality. IMPRESSION: Changes of acute pancreatitis. No evidence of pancreatic necrosis or developing pseudocyst currently. Severe diffuse fatty infiltration of the liver. Electronically Signed   By: Charlett Nose M.D.   On: 05/24/2023 16:03    EKG: Independently reviewed. See above   Assessment/Plan  Seizure disorder exacerbated by severe ETOH disorder -admit to progressive care  - place on seizure precautions  -seizure episode in setting of ETOH intoxication with level of 382  -also concern for compliance with keppra  - loaded with keppra in ED  -no further sz episode   -mag borderline 1.9 however other electrolytes stable  -resume home dose keppra   Recurrent Acute ETOH pancreatitis  -ivfs per protocol  - monitor I/o  - CT abd/pelvis  note developing pseudocyst /prior mention of pseudocyst in the past  - supportive pain medications as able   Acute Metabolic acidosis  -due to lactic acidosis / ETOH use  - continue to trend lactic acid, -ph 7.2/ bicarb 11 - bicarb drip at 75 , continue LR at 125 cc/hr -  to be complete /volatile acid r/o ingestion -elevated hydroxybutyric acid - due to etoh abuse and starvation ketosis not thought be related to DKA  Mild Elevation of AST - due to etoh in setting of Fatty liver noted no imaging .   Severe ETOH abuse with intoxication  - etoh level 382  - place on ciwa protocol -mvi,folate /thiamine per protocol    QT prolongation ( 624)  - mag 1.9 -replete mag to above 2  - hold QT prolonging medication  -repeat EKG  now and in am   HIV - hold BIKtarvy in setting of acute pancreatitis as well as Prolonged QT  - followed by ID Dr Renold Don,  PTSD Bipolar II Schizophrenia Anxiety -not on medications     DVT prophylaxis: scd , borderline platletes Code Status: full/ as discussed per patient wishes in event of cardiac arrest  Family Communication: none at bedside Disposition Plan: patient  expected to be admitted greater than 2 midnights  Consults called: n/a  Admission status: progressive care   Lurline Del MD [facility]   If 7PM-7AM, please contact night-coverage [email or web address] Password [facility]  05/24/2023, 5:00 PM

## 2023-05-24 NOTE — Progress Notes (Signed)
Pt complain of left side on pain. Place pt on O2, vitals take, did EKG. Notifed provider.

## 2023-05-24 NOTE — ED Triage Notes (Signed)
BIB EMS due to reported seizure while sitting, did not fall, pain responsive, CBG 63 1/4 D 50 now 97. 100. 94% 104/56 #20 L Hand. Told EMS he needs ETOH or his stomach hurts.

## 2023-05-24 NOTE — ED Notes (Signed)
ED TO INPATIENT HANDOFF REPORT  ED Nurse Name and Phone #: Crist Infante, RN [phone]  S Name/Age/Gender Gary [name] 37 y.o. male Room/Bed: WA14/WA14  Code Status   Code Status: Prior  Home/SNF/Other Homeless Patient oriented to: self and place Is this baseline? No   Triage Complete: Triage complete  Chief Complaint Seizure disorder (HCC) [G40.909]  Triage Note BIB EMS due to reported seizure while sitting, did not fall, pain responsive, CBG 63 1/4 D 50 now 97. 100. 94% 104/56 #20 L Hand. Told EMS he needs ETOH or his stomach hurts.   Allergies Allergies  Allergen Reactions   Tegretol [Carbamazepine] Other (See Comments)    Caused vertigo for 2 days after taking it   Caffeine Palpitations    Level of Care/Admitting Diagnosis ED Disposition     ED Disposition  Admit   Condition  --   Comment  Hospital Area: [facility] [[id number]]  Level of Care: Progressive [102]  Admit to Progressive based on following criteria: GI, ENDOCRINE disease patients with GI bleeding, acute liver failure or pancreatitis, stable with diabetic ketoacidosis or thyrotoxicosis (hypothyroid) state.  Admit to Progressive based on following criteria: ACUTE MENTAL DISORDER-RELATED Drug/Alcohol Ingestion/Overdose/Withdrawal, Suicidal Ideation/attempt requiring safety sitter and < Q2h monitoring/assessments, moderate to severe agitation that is managed with medication/sitter, CIWA-Ar score < 20.  May admit patient to [name] or [facility] if equivalent level of care is available:: No  Covid Evaluation: Asymptomatic - no recent exposure (last 10 days) testing not required  Diagnosis: Seizure disorder [facility]) [[id number]]  Admitting Physician: Lurline Del [[id number]]  Attending Physician: Lurline Del [[id number]]  Certification:: I certify this patient will need inpatient services for at least 2 midnights  Estimated Length of Stay: 3          B Medical/Surgery History Past  Medical History:  Diagnosis Date   Alcohol abuse    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   Anxiety    Bipolar 2 disorder (HCC)    HIV (human immunodeficiency virus infection) (HCC)    Pancreatitis    PTSD (post-traumatic stress disorder)    Schizophrenia (HCC)    Seizures (HCC)    Subdural hematoma (HCC)    Past Surgical History:  Procedure Laterality Date   BIOPSY  04/19/2022   Procedure: BIOPSY;  Surgeon: Lemar Lofty., MD;  Location: [facility] ENDOSCOPY;  Service: Gastroenterology;;   ENTEROSCOPY N/A 04/19/2022   Procedure: ENTEROSCOPY;  Surgeon: Lemar Lofty., MD;  Location: [facility] ENDOSCOPY;  Service: Gastroenterology;  Laterality: N/A;   INCISION AND DRAINAGE PERIRECTAL ABSCESS N/A 09/24/2016   Procedure: IRRIGATION AND DEBRIDEMENT PERIRECTAL ABSCESS;  Surgeon: Ricarda Frame, MD;  Location: [facility] ORS;  Service: General;  Laterality: N/A;   none       A IV Location/Drains/Wounds Patient Lines/Drains/Airways Status     Active Line/Drains/Airways     Name Placement date Placement time Site Days   Peripheral IV 05/24/23 20 G 1" Left;Posterior Hand 05/24/23  1229  Hand  less than 1            Intake/Output Last 24 hours  Intake/Output Summary (Last 24 hours) at 05/24/2023 1704 Last data filed at 05/24/2023 1623 Gross per 24 hour  Intake 1134.5 ml  Output --  Net 1134.5 ml    Labs/Imaging Results for orders placed or performed during the hospital encounter of 05/24/23 (from the past 48 hour(s))  CBG monitoring, ED     Status: Abnormal   Collection Time: 05/24/23 12:21 PM  Result Value Ref Range   Glucose-Capillary 122 (H) 70 - 99 mg/dL    Comment: Glucose reference range applies only to samples taken after fasting for at least 8 hours.  Comprehensive metabolic panel     Status: Abnormal   Collection Time: 05/24/23  1:15 PM  Result Value Ref Range   Sodium 139 135 - 145 mmol/L    Comment: ELECTROLYTES REPEATED TO VERIFY   Potassium 3.9 3.5 - 5.1  mmol/L   Chloride 105 98 - 111 mmol/L    Comment: ELECTROLYTES REPEATED TO VERIFY   CO2 11 (L) 22 - 32 mmol/L    Comment: ELECTROLYTES REPEATED TO VERIFY   Glucose, Bld 105 (H) 70 - 99 mg/dL    Comment: Glucose reference range applies only to samples taken after fasting for at least 8 hours.   BUN 16 6 - 20 mg/dL   Creatinine, Ser 1.44 0.61 - 1.24 mg/dL   Calcium 8.2 (L) 8.9 - 10.3 mg/dL   Total Protein 7.7 6.5 - 8.1 g/dL   Albumin 4.3 3.5 - 5.0 g/dL   AST 70 (H) 15 - 41 U/L   ALT 44 0 - 44 U/L   Alkaline Phosphatase 79 38 - 126 U/L   Total Bilirubin 1.3 (H) 0.3 - 1.2 mg/dL   GFR, Estimated >31 >54 mL/min    Comment: (NOTE) Calculated using the CKD-EPI Creatinine Equation (2021)    Anion gap 23 (H) 5 - 15    Comment: ELECTROLYTES REPEATED TO VERIFY Performed at [facility], [address] [place], [place] [address]   Ethanol     Status: Abnormal   Collection Time: 05/24/23  1:15 PM  Result Value Ref Range   Alcohol, Ethyl (B) 382 (HH) <10 mg/dL    Comment: CRITICAL RESULT CALLED TO, READ BACK BY AND VERIFIED WITH Nikkie Liming,T. RN AT 1355 05/24/23 MULLINS,T (NOTE) Lowest detectable limit for serum alcohol is 10 mg/dL.  For medical purposes only. Performed at [facility], [address], [place], [place] [address]   CBC with Differential     Status: Abnormal   Collection Time: 05/24/23  1:15 PM  Result Value Ref Range   WBC 6.5 4.0 - 10.5 K/uL   RBC 4.34 4.22 - 5.81 MIL/uL   Hemoglobin 13.9 13.0 - 17.0 g/dL   HCT 93.2 67.1 - 24.5 %   MCV 100.2 (H) 80.0 - 100.0 fL   MCH 32.0 26.0 - 34.0 pg   MCHC 32.0 30.0 - 36.0 g/dL   RDW 80.9 98.3 - 38.2 %   Platelets 102 (L) 150 - 400 K/uL   nRBC 0.0 0.0 - 0.2 %   Neutrophils Relative % 75 %   Neutro Abs 4.9 1.7 - 7.7 K/uL   Lymphocytes Relative 19 %   Lymphs Abs 1.2 0.7 - 4.0 K/uL   Monocytes Relative 5 %   Monocytes Absolute 0.3 0.1 - 1.0 K/uL   Eosinophils Relative 0 %   Eosinophils  Absolute 0.0 0.0 - 0.5 K/uL   Basophils Relative 1 %   Basophils Absolute 0.1 0.0 - 0.1 K/uL   Immature Granulocytes 0 %   Abs Immature Granulocytes 0.02 0.00 - 0.07 K/uL    Comment: Performed at [facility], [address] [place], [place] [address]  CK     Status: None   Collection Time: 05/24/23  1:15 PM  Result Value Ref Range   Total CK 242 49 - 397 U/L    Comment:  Performed at [facility], [address], [place], [place] [address]  Magnesium     Status: None   Collection Time: 05/24/23  1:15 PM  Result Value Ref Range   Magnesium 1.9 1.7 - 2.4 mg/dL    Comment: Performed at [facility], [address], [place], [place] [address]  Lipase, blood     Status: Abnormal   Collection Time: 05/24/23  1:15 PM  Result Value Ref Range   Lipase 808 (H) 11 - 51 U/L    Comment: RESULT CONFIRMED BY MANUAL DILUTION Performed at [facility], [address], [place], [place] [address]   Lactic acid, plasma     Status: Abnormal   Collection Time: 05/24/23  2:21 PM  Result Value Ref Range   Lactic Acid, Venous 6.6 (HH) 0.5 - 1.9 mmol/L    Comment: CRITICAL RESULT CALLED TO, READ BACK BY AND VERIFIED WITH Avynn Klassen,T. RN AT 1456 05/24/23 MULLINS,T Performed at [facility], [address] [place], [place] [address]   Beta-hydroxybutyric acid     Status: Abnormal   Collection Time: 05/24/23  2:21 PM  Result Value Ref Range   Beta-Hydroxybutyric Acid >8.00 (H) 0.05 - 0.27 mmol/L    Comment: RESULT CONFIRMED BY MANUAL DILUTION Performed at [facility], [address], [place], [place] [address]    CT Head Wo Contrast  Result Date: 05/24/2023 CLINICAL DATA:  Mental status change seizure EXAM: CT HEAD WITHOUT CONTRAST TECHNIQUE: Contiguous axial images were obtained from the base of the skull through the vertex without intravenous contrast. RADIATION DOSE REDUCTION: This exam  was performed according to the departmental dose-optimization program which includes automated exposure control, adjustment of the mA and/or kV according to patient size and/or use of iterative reconstruction technique. COMPARISON:  CT 05/21/2023 FINDINGS: Brain: No acute territorial infarction, hemorrhage or intracranial mass. Nonenlarged ventricles Vascular: Contrast media within the intracranial vessels and dural venous sinuses from recently performed contrast-enhanced examination. Skull: Normal. Negative for fracture or focal lesion. Sinuses/Orbits: No acute finding. Other: None IMPRESSION: No CT evidence for acute intracranial abnormality. Slight limitation secondary to previously administered contrast media Electronically Signed   By: Jasmine Pang M.D.   On: 05/24/2023 16:22   CT ABDOMEN PELVIS W CONTRAST  Result Date: 05/24/2023 CLINICAL DATA:  Pancreatitis, acute, severe EXAM: CT ABDOMEN AND PELVIS WITH CONTRAST TECHNIQUE: Multidetector CT imaging of the abdomen and pelvis was performed using the standard protocol following bolus administration of intravenous contrast. RADIATION DOSE REDUCTION: This exam was performed according to the departmental dose-optimization program which includes automated exposure control, adjustment of the mA and/or kV according to patient size and/or use of iterative reconstruction technique. CONTRAST:  OMNIPAQUE IOHEXOL 300 MG/ML  SOLN COMPARISON:  12/22/2022 FINDINGS: Lower chest: No acute abnormality Hepatobiliary: Severe diffuse fatty infiltration of the liver. Gallbladder is distended. No visible stones or biliary ductal dilatation. Pancreas: No focal pancreatic mass or ductal dilatation. There is truncation of the pancreatic tail. No visible mass lesion. Surrounding inflammation around the pancreas compatible with acute pancreatitis. Spleen: No focal abnormality.  Normal size. Adrenals/Urinary Tract: No adrenal abnormality. No focal renal abnormality. No stones or  hydronephrosis. Urinary bladder is unremarkable. Stomach/Bowel: Normal appendix. Stomach, large and small bowel grossly unremarkable. Vascular/Lymphatic: No evidence of aneurysm or adenopathy. Reproductive: No visible focal abnormality. Other: No free fluid or free air. Musculoskeletal: No acute bony abnormality. IMPRESSION: Changes of acute pancreatitis. No evidence of pancreatic necrosis or  developing pseudocyst currently. Severe diffuse fatty infiltration of the liver. Electronically Signed   By: Charlett Nose M.D.   On: 05/24/2023 16:03    Pending Labs Unresulted Labs (From admission, onward)     Start     Ordered   05/24/23 1534  Urinalysis, Routine w reflex microscopic -Urine, Clean Catch  Once,   URGENT       Question:  Specimen Source  Answer:  Urine, Clean Catch   05/24/23 1533   05/24/23 1534  Rapid urine drug screen (hospital performed)  ONCE - STAT,   STAT        05/24/23 1533   05/24/23 1407  Lactic acid, plasma  Now then every 2 hours,   R (with STAT occurrences)      05/24/23 1407            Vitals/Pain Today's Vitals   05/24/23 1500 05/24/23 1515 05/24/23 1530 05/24/23 1559  BP: 124/83 126/87 127/82   Pulse: (!) 102 (!) 104 (!) 107   Resp:   (!) 25   Temp:      TempSrc:      SpO2: 99% 100% 97%   Weight:      Height:      PainSc:    Asleep    Isolation Precautions No active isolations  Medications Medications  lactated ringers bolus 1,000 mL (0 mLs Intravenous Stopped 05/24/23 1623)  levETIRAcetam (KEPPRA) IVPB 1500 mg/ 100 mL premix (0 mg Intravenous Stopped 05/24/23 1351)  famotidine (PEPCID) IVPB 20 mg premix (0 mg Intravenous Stopped 05/24/23 1410)  dextrose 5 % solution ( Intravenous New Bag/Given 05/24/23 1439)  morphine (PF) 4 MG/ML injection 4 mg (4 mg Intravenous Given 05/24/23 1525)  iohexol (OMNIPAQUE) 300 MG/ML solution 100 mL (100 mLs Intravenous Contrast Given 05/24/23 1557)    Mobility walks     Focused Assessments Neuro Assessment  Handoff:  Swallow screen pass?  N/A         Neuro Assessment:   Neuro Checks:      Has TPA been given? No If patient is a Neuro Trauma and patient is going to OR before floor call report to 4N Charge nurse: [phone] or [phone]   R Recommendations: See Admitting Provider Note  Report given to:   Additional Notes:

## 2023-05-25 DIAGNOSIS — G40909 Epilepsy, unspecified, not intractable, without status epilepticus: Secondary | ICD-10-CM | POA: Diagnosis not present

## 2023-05-25 LAB — LIPASE, BLOOD: Lipase: 949 U/L — ABNORMAL HIGH (ref 11–51)

## 2023-05-25 LAB — BLOOD GAS, VENOUS
Acid-base deficit: 12.6 mmol/L — ABNORMAL HIGH (ref 0.0–2.0)
Acid-base deficit: 8.9 mmol/L — ABNORMAL HIGH (ref 0.0–2.0)
Bicarbonate: 13.8 mmol/L — ABNORMAL LOW (ref 20.0–28.0)
Bicarbonate: 16.8 mmol/L — ABNORMAL LOW (ref 20.0–28.0)
O2 Saturation: 99.8 %
O2 Saturation: 87.7 %
Patient temperature: 36.4
Patient temperature: 36.5
pCO2, Ven: 32 mmHg — ABNORMAL LOW (ref 44–60)
pCO2, Ven: 34 mmHg — ABNORMAL LOW (ref 44–60)
pH, Ven: 7.24 — ABNORMAL LOW (ref 7.25–7.43)
pH, Ven: 7.3 (ref 7.25–7.43)
pO2, Ven: 103 mmHg — ABNORMAL HIGH (ref 32–45)
pO2, Ven: 52 mmHg — ABNORMAL HIGH (ref 32–45)

## 2023-05-25 LAB — BASIC METABOLIC PANEL
Anion gap: 20 — ABNORMAL HIGH (ref 5–15)
Anion gap: 17 — ABNORMAL HIGH (ref 5–15)
Anion gap: 18 — ABNORMAL HIGH (ref 5–15)
BUN: 7 mg/dL (ref 6–20)
BUN: <5 mg/dL — ABNORMAL LOW (ref 6–20)
BUN: <5 mg/dL — ABNORMAL LOW (ref 6–20)
CO2: 12 mmol/L — ABNORMAL LOW (ref 22–32)
CO2: 16 mmol/L — ABNORMAL LOW (ref 22–32)
CO2: 16 mmol/L — ABNORMAL LOW (ref 22–32)
Calcium: 8.1 mg/dL — ABNORMAL LOW (ref 8.9–10.3)
Calcium: 8.4 mg/dL — ABNORMAL LOW (ref 8.9–10.3)
Calcium: 8.9 mg/dL (ref 8.9–10.3)
Chloride: 104 mmol/L (ref 98–111)
Chloride: 105 mmol/L (ref 98–111)
Chloride: 103 mmol/L (ref 98–111)
Creatinine, Ser: 0.63 mg/dL (ref 0.61–1.24)
Creatinine, Ser: 0.74 mg/dL (ref 0.61–1.24)
Creatinine, Ser: 0.74 mg/dL (ref 0.61–1.24)
GFR, Estimated: >60 mL/min (ref >60)
GFR, Estimated: >60 mL/min (ref >60)
GFR, Estimated: >60 mL/min (ref >60)
Glucose, Bld: 168 mg/dL — ABNORMAL HIGH (ref 70–99)
Glucose, Bld: 132 mg/dL — ABNORMAL HIGH (ref 70–99)
Glucose, Bld: 135 mg/dL — ABNORMAL HIGH (ref 70–99)
Potassium: 4.3 mmol/L (ref 3.5–5.1)
Potassium: 4 mmol/L (ref 3.5–5.1)
Potassium: 3.8 mmol/L (ref 3.5–5.1)
Sodium: 136 mmol/L (ref 135–145)
Sodium: 138 mmol/L (ref 135–145)
Sodium: 137 mmol/L (ref 135–145)

## 2023-05-25 LAB — COMPREHENSIVE METABOLIC PANEL
ALT: 39 U/L (ref 0–44)
ALT: 38 U/L (ref 0–44)
AST: 49 U/L — ABNORMAL HIGH (ref 15–41)
AST: 46 U/L — ABNORMAL HIGH (ref 15–41)
Albumin: 4 g/dL (ref 3.5–5.0)
Albumin: 4.1 g/dL (ref 3.5–5.0)
Alkaline Phosphatase: 76 U/L (ref 38–126)
Alkaline Phosphatase: 73 U/L (ref 38–126)
Anion gap: 19 — ABNORMAL HIGH (ref 5–15)
Anion gap: 17 — ABNORMAL HIGH (ref 5–15)
BUN: 6 mg/dL (ref 6–20)
BUN: <5 mg/dL — ABNORMAL LOW (ref 6–20)
CO2: 14 mmol/L — ABNORMAL LOW (ref 22–32)
CO2: 16 mmol/L — ABNORMAL LOW (ref 22–32)
Calcium: 8 mg/dL — ABNORMAL LOW (ref 8.9–10.3)
Calcium: 8.5 mg/dL — ABNORMAL LOW (ref 8.9–10.3)
Chloride: 102 mmol/L (ref 98–111)
Chloride: 104 mmol/L (ref 98–111)
Creatinine, Ser: 0.69 mg/dL (ref 0.61–1.24)
Creatinine, Ser: 0.7 mg/dL (ref 0.61–1.24)
GFR, Estimated: >60 mL/min (ref >60)
GFR, Estimated: >60 mL/min (ref >60)
Glucose, Bld: 148 mg/dL — ABNORMAL HIGH (ref 70–99)
Glucose, Bld: 133 mg/dL — ABNORMAL HIGH (ref 70–99)
Potassium: 4 mmol/L (ref 3.5–5.1)
Potassium: 4.2 mmol/L (ref 3.5–5.1)
Sodium: 135 mmol/L (ref 135–145)
Sodium: 137 mmol/L (ref 135–145)
Total Bilirubin: 1.5 mg/dL — ABNORMAL HIGH (ref 0.3–1.2)
Total Bilirubin: 2.3 mg/dL — ABNORMAL HIGH (ref 0.3–1.2)
Total Protein: 7.1 g/dL (ref 6.5–8.1)
Total Protein: 7.2 g/dL (ref 6.5–8.1)

## 2023-05-25 LAB — CBC
HCT: 45.6 % (ref 39.0–52.0)
Hemoglobin: 14.8 g/dL (ref 13.0–17.0)
MCH: 32.1 pg (ref 26.0–34.0)
MCHC: 32.5 g/dL (ref 30.0–36.0)
MCV: 98.9 fL (ref 80.0–100.0)
Platelets: 109 10*3/uL — ABNORMAL LOW (ref 150–400)
RBC: 4.61 MIL/uL (ref 4.22–5.81)
RDW: 14.6 % (ref 11.5–15.5)
WBC: 8 10*3/uL (ref 4.0–10.5)
nRBC: 0 % (ref 0.0–0.2)

## 2023-05-25 LAB — LACTIC ACID, PLASMA
Lactic Acid, Venous: 2.5 mmol/L (ref 0.5–1.9)
Lactic Acid, Venous: 1.8 mmol/L (ref 0.5–1.9)
Lactic Acid, Venous: 2 mmol/L (ref 0.5–1.9)

## 2023-05-25 LAB — VOLATILES,BLD-ACETONE,ETHANOL,ISOPROP,METHANOL
Acetone, blood: 0.021 g/dL — ABNORMAL HIGH (ref 0.000–0.010)
Ethanol, blood: 0.174 g/dL — ABNORMAL HIGH (ref 0.000–0.010)
Isopropanol, blood: <0.01 g/dL (ref 0.000–0.010)
Methanol, blood: <0.01 g/dL (ref 0.000–0.010)

## 2023-05-25 LAB — OSMOLALITY: Osmolality: 344 mOsm/kg (ref 275–295)

## 2023-05-25 LAB — AMMONIA: Ammonia: 19 umol/L (ref 9–35)

## 2023-05-25 LAB — HEMOGLOBIN A1C
Hgb A1c MFr Bld: 5 % (ref 4.8–5.6)
Mean Plasma Glucose: 96.8 mg/dL

## 2023-05-25 LAB — ETHANOL: Alcohol, Ethyl (B): 154 mg/dL — ABNORMAL HIGH (ref <10)

## 2023-05-25 MED ORDER — ONDANSETRON HCL 4 MG/2ML IJ SOLN
4.0000 mg | Freq: Four times a day (QID) | INTRAMUSCULAR | Status: DC | PRN
  Administered 2023-05-25 – 2023-05-29 (×4): 4 mg via INTRAVENOUS
  Filled 2023-05-25 (×5): qty 2

## 2023-05-25 MED ORDER — THIAMINE HCL 100 MG/ML IJ SOLN
500.0000 mg | INTRAVENOUS | Status: AC
  Administered 2023-05-25 – 2023-05-28 (×9): 500 mg via INTRAVENOUS
  Filled 2023-05-25 (×11): qty 5

## 2023-05-25 MED ORDER — SODIUM CHLORIDE 0.45 % IV SOLN
INTRAVENOUS | Status: DC
  Filled 2023-05-25 (×7): qty 75

## 2023-05-25 MED ORDER — CHLORDIAZEPOXIDE HCL 25 MG PO CAPS
25.0000 mg | ORAL_CAPSULE | Freq: Three times a day (TID) | ORAL | Status: DC
  Administered 2023-05-26 – 2023-05-28 (×8): 25 mg via ORAL
  Filled 2023-05-25 (×9): qty 1

## 2023-05-25 MED ORDER — PROCHLORPERAZINE EDISYLATE 10 MG/2ML IJ SOLN
5.0000 mg | Freq: Once | INTRAMUSCULAR | Status: AC | PRN
  Administered 2023-05-25: 5 mg via INTRAVENOUS
  Filled 2023-05-25: qty 2

## 2023-05-25 MED ORDER — LACTATED RINGERS IV BOLUS
500.0000 mL | Freq: Once | INTRAVENOUS | Status: AC
  Administered 2023-05-25: 500 mL via INTRAVENOUS

## 2023-05-25 NOTE — Plan of Care (Signed)
   Problem: Coping: Goal: Level of anxiety will decrease Outcome: Completed/Met

## 2023-05-25 NOTE — Progress Notes (Signed)
PROGRESS NOTE    Gary [name]  WUJ:[id number] DOB: 08-Oct-1986 DOA: 05/24/2023 PCP: Patient, No Pcp Per    Brief Narrative: 37 year old male with multiple co morbidities HIV followed by Dr. Dorna Bloom, seizure disorder, PTSD, severe protein calorie malnutrition secondary to HIV and homelessness, history of subdural hematoma.,  Anxiety, alcohol use/abuse, schizophrenia, admitted with witnessed seizure activity.  He has been drinking alcohol yesterday and was sitting on the bench with his friend when he had a seizure activity.  He drank alcohol all day till he came to the ER.  Even after he came to the ER he was requesting for alcohol so his abdomen pain will resolve.  He has not been taking his home Keppra. He was admitted with complaints of nausea vomiting and abdominal pain. He has had numerous ED visits and her hospital admissions due to alcohol intoxication and several times he left AGAINST MEDICAL ADVICE. Patient was brought in by EMS due to seizure he was found to be hypoglycemic with CBG 63 he was given D50 repeat CBG in the ER 97 He was admitted with a diagnosis of pancreatitis with a lipase of over 800, alcohol level elevated 382 anion gap of 23 with elevated beta hydroxybutyrate and lactic acid 6.6 concerning for alcoholic/starvation ketoacidosis. He was tachypneic and tachycardic and afebrile on arrival to the ER. QTc 600 CT head no CT evidence of acute intracranial abnormality. CT abdomen and pelvis changes of acute pancreatitis no evidence of pancreatic necrosis or developing pseudocyst.  Severe diffuse fatty infiltration of the liver. Initial gas 7.2 1/36/57 Repeat gas 7. 2/32/103 Anion gap 19 Urine drug screen surprisingly negative AST 49, ALT 39, total bilirubin 1.5, lactic acid 2.8 to 2.5, serum osmolality 344, normal white count, platelets 109   Assessment & Plan:   Principal Problem:   Seizure disorder (HCC)  #1 high gap metabolic acidosis/lactic acidosis/elevated beta  hydroxybutyrate-secondary to EtOH abuse/starvation ketosis- Patient was initially on a bicarb drip which has been stopped overnight now on Ringer lactate 200 cc an hour. Urine drug screen is negative Alcohol level on admission 382 down to 154 Serum acetone, ethanol positive, salicylate less than 7, No methanol isopropanol noted Repeat CMP today and in a.m. Trend lactic acid it is trending down Trend beta hydroxybutyrate Addendum -restarted bicarb drip with worsening acidosis.  #2 acute pancreatitis secondary to alcohol use-continue IV fluids Follow-up lipase Pain control with Dilaudid Continue Protonix for possible gastritis CT abdomen and pelvis shows findings of acute pancreatitis with fatty liver  #3 EtOH abuse/withdrawal/elevated LFTs continue CIWA Add Librium High-dose thiamine B12 folate  #4 alcohol withdrawal seizures//seizure disorder admitted with witnessed seizure prior to admission with hypoglycemia was given glucose by EMS  Continue Keppra Seizure precautions Optimize electrolytes  #5 HIV holding Biktarvy in the setting of acute pancreatitis patient has been noncompliant with his medication prior to admission  #6 homelessness TOC consult  #7 miscellaneous anxiety/bipolar/PTSD-not on any medications prior to admission  #8 prolonged QT on as needed Zofran monitor Follow-up EKG  #9 thrombocytopenia likely secondary to EtOH follow-up levels in a.m. not on heparin or Lovenox.  Estimated body mass index is 20.03 kg/m as calculated from the following:   Height as of this encounter: 6' (1.829 m).   Weight as of this encounter: 67 kg.  DVT prophylaxis: SCD Code Status: Full code Family Communication: None at bedside  disposition Plan:  Status is: Inpatient Remains inpatient appropriate because: Alcohol withdrawal seizures metabolic acidosis   Consultants:  None  Procedures: None Antimicrobials: None  Subjective: Resting in bed with eyes rolled all the way to  the back apparently that is how he sleeps but he was able to follow commands and have a  short conversation  Objective: Vitals:   05/24/23 2224 05/25/23 0057 05/25/23 0436 05/25/23 0924  BP: 123/84 136/83 121/85 (!) 140/95  Pulse: (!) 101 (!) 117 (!) 118 (!) 125  Resp: 16 20 20  (!) 23  Temp: (!) 97.5 F (36.4 C) 97.7 F (36.5 C) (!) 97.4 F (36.3 C) 99 F (37.2 C)  TempSrc: Oral Oral Oral Oral  SpO2: 93% 100% 97% 100%  Weight:      Height:        Intake/Output Summary (Last 24 hours) at 05/25/2023 1131 Last data filed at 05/25/2023 5621 Gross per 24 hour  Intake 1134.5 ml  Output 1475 ml  Net -340.5 ml   Filed Weights   05/24/23 1226  Weight: 67 kg    Examination:  General exam: Appears cachectic chronically ill-appearing  respiratory system: Clear to auscultation. Respiratory effort normal. Cardiovascular system: S1 & S2 heard, RRR. No JVD, murmurs, rubs, gallops or clicks. No pedal edema. Gastrointestinal system: Abdomen is nondistended, soft and tender. No organomegaly or masses felt. Normal bowel sounds heard. Central nervous system: awake  Extremities: edema    Data Reviewed: I have personally reviewed following labs and imaging studies  CBC: Recent Labs  Lab 05/20/23 1440 05/21/23 0658 05/24/23 1315 05/25/23 0534  WBC 2.6* 2.7* 6.5 8.0  NEUTROABS 1.1*  --  4.9  --   HGB 13.5 12.5* 13.9 14.8  HCT 39.9 37.4* 43.5 45.6  MCV 95.2 96.9 100.2* 98.9  PLT 79* 71* 102* 109*   Basic Metabolic Panel: Recent Labs  Lab 05/21/23 0658 05/24/23 1315 05/24/23 2133 05/25/23 0043 05/25/23 0534  NA 137 139 135 136 135  K 4.0 3.9 4.3 4.3 4.0  CL 100 105 102 104 102  CO2 17* 11* 13* 12* 14*  GLUCOSE 78 105* 168* 168* 148*  BUN 9 16 9 7 6   CREATININE 0.68 0.81 0.73 0.63 0.69  CALCIUM 9.0 8.2* 8.1* 8.1* 8.0*  MG 1.8 1.9 2.7*  --   --   PHOS 4.4  --  4.1  --   --    GFR: Estimated Creatinine Clearance: 121 mL/min (by C-G formula based on SCr of 0.69  mg/dL). Liver Function Tests: Recent Labs  Lab 05/20/23 1440 05/21/23 0658 05/24/23 1315 05/25/23 0534  AST 57* 54* 70* 49*  ALT 36 36 44 39  ALKPHOS 78 76 79 76  BILITOT 0.7 1.5* 1.3* 1.5*  PROT 7.7 7.4 7.7 7.1  ALBUMIN 4.1 4.2 4.3 4.0   Recent Labs  Lab 05/21/23 0658 05/24/23 1315  LIPASE 33 808*   No results for input(s): "AMMONIA" in the last 168 hours. Coagulation Profile: No results for input(s): "INR", "PROTIME" in the last 168 hours. Cardiac Enzymes: Recent Labs  Lab 05/20/23 1440 05/21/23 0658 05/24/23 1315  CKTOTAL 418* 323 242   BNP (last 3 results) No results for input(s): "PROBNP" in the last 8760 hours. HbA1C: Recent Labs    05/25/23 0043  HGBA1C 5.0   CBG: Recent Labs  Lab 05/21/23 0557 05/24/23 1221  GLUCAP 61* 122*   Lipid Profile: No results for input(s): "CHOL", "HDL", "LDLCALC", "TRIG", "CHOLHDL", "LDLDIRECT" in the last 72 hours. Thyroid Function Tests: Recent Labs    05/24/23 2133  TSH 0.333*   Anemia Panel: No results for input(s): "VITAMINB12", "  FOLATE", "FERRITIN", "TIBC", "IRON", "RETICCTPCT" in the last 72 hours. Sepsis Labs: Recent Labs  Lab 05/24/23 1421 05/24/23 1753 05/24/23 2133 05/25/23 0043  LATICACIDVEN 6.6* 3.3* 2.8* 2.5*    Recent Results (from the past 240 hour(s))  SARS Coronavirus 2 by RT PCR (hospital order, performed in [facility] hospital lab) *cepheid single result test* Anterior Nasal Swab     Status: None   Collection Time: 05/21/23  6:27 AM   Specimen: Anterior Nasal Swab  Result Value Ref Range Status   SARS Coronavirus 2 by RT PCR NEGATIVE NEGATIVE Final    Comment: (NOTE) SARS-CoV-2 target nucleic acids are NOT DETECTED.  The SARS-CoV-2 RNA is generally detectable in upper and lower respiratory specimens during the acute phase of infection. The lowest concentration of SARS-CoV-2 viral copies this assay can detect is 250 copies / mL. A negative result does not preclude SARS-CoV-2  infection and should not be used as the sole basis for treatment or other patient management decisions.  A negative result may occur with improper specimen collection / handling, submission of specimen other than nasopharyngeal swab, presence of viral mutation(s) within the areas targeted by this assay, and inadequate number of viral copies (<250 copies / mL). A negative result must be combined with clinical observations, patient history, and epidemiological information.  Fact Sheet for Patients:   RoadLapTop.co.za  Fact Sheet for Healthcare Providers: http://kim-miller.com/  This test is not yet approved or  cleared by the [place] FDA and has been authorized for detection and/or diagnosis of SARS-CoV-2 by FDA under an Emergency Use Authorization (EUA).  This EUA will remain in effect (meaning this test can be used) for the duration of the COVID-19 declaration under Section 564(b)(1) of the Act, 21 U.S.C. section 360bbb-3(b)(1), unless the authorization is terminated or revoked sooner.  Performed at [facility], [address], [place], [place] [address]          Radiology Studies: CT Head Wo Contrast  Result Date: 05/24/2023 CLINICAL DATA:  Mental status change seizure EXAM: CT HEAD WITHOUT CONTRAST TECHNIQUE: Contiguous axial images were obtained from the base of the skull through the vertex without intravenous contrast. RADIATION DOSE REDUCTION: This exam was performed according to the departmental dose-optimization program which includes automated exposure control, adjustment of the mA and/or kV according to patient size and/or use of iterative reconstruction technique. COMPARISON:  CT 05/21/2023 FINDINGS: Brain: No acute territorial infarction, hemorrhage or intracranial mass. Nonenlarged ventricles Vascular: Contrast media within the intracranial vessels and dural venous sinuses from recently performed  contrast-enhanced examination. Skull: Normal. Negative for fracture or focal lesion. Sinuses/Orbits: No acute finding. Other: None IMPRESSION: No CT evidence for acute intracranial abnormality. Slight limitation secondary to previously administered contrast media Electronically Signed   By: Jasmine Pang M.D.   On: 05/24/2023 16:22   CT ABDOMEN PELVIS W CONTRAST  Result Date: 05/24/2023 CLINICAL DATA:  Pancreatitis, acute, severe EXAM: CT ABDOMEN AND PELVIS WITH CONTRAST TECHNIQUE: Multidetector CT imaging of the abdomen and pelvis was performed using the standard protocol following bolus administration of intravenous contrast. RADIATION DOSE REDUCTION: This exam was performed according to the departmental dose-optimization program which includes automated exposure control, adjustment of the mA and/or kV according to patient size and/or use of iterative reconstruction technique. CONTRAST:  OMNIPAQUE IOHEXOL 300 MG/ML  SOLN COMPARISON:  12/22/2022 FINDINGS: Lower chest: No acute abnormality Hepatobiliary: Severe diffuse fatty infiltration of the liver. Gallbladder is distended. No visible stones or biliary ductal dilatation.  Pancreas: No focal pancreatic mass or ductal dilatation. There is truncation of the pancreatic tail. No visible mass lesion. Surrounding inflammation around the pancreas compatible with acute pancreatitis. Spleen: No focal abnormality.  Normal size. Adrenals/Urinary Tract: No adrenal abnormality. No focal renal abnormality. No stones or hydronephrosis. Urinary bladder is unremarkable. Stomach/Bowel: Normal appendix. Stomach, large and small bowel grossly unremarkable. Vascular/Lymphatic: No evidence of aneurysm or adenopathy. Reproductive: No visible focal abnormality. Other: No free fluid or free air. Musculoskeletal: No acute bony abnormality. IMPRESSION: Changes of acute pancreatitis. No evidence of pancreatic necrosis or developing pseudocyst currently. Severe diffuse fatty  infiltration of the liver. Electronically Signed   By: Charlett Nose M.D.   On: 05/24/2023 16:03        Scheduled Meds:  folic acid  1 mg Oral Daily   multivitamin with minerals  1 tablet Oral Daily   pantoprazole (PROTONIX) IV  40 mg Intravenous Q12H   sucralfate  1 g Oral TID WC & HS   thiamine  100 mg Oral Daily   Or   thiamine  100 mg Intravenous Daily   Continuous Infusions:  lactated ringers 200 mL/hr at 05/25/23 1054   levETIRAcetam 500 mg (05/25/23 1059)     LOS: 1 day   Time spent: 38 min  Alwyn Ren, MD [facility] 05/25/2023, 11:31 AM

## 2023-05-25 NOTE — TOC Initial Note (Addendum)
Transition of Care [facility]) - Initial/Assessment Note    Patient Details  Name: Gary [name] MRN: [id number] Date of Birth: 04-08-1986  Transition of Care [facility]) CM/SW Contact:    Howell Rucks, RN Phone Number: 05/25/2023, 1:38 PM  Clinical Narrative:  Met with pt at bedside, pt drowsy during assessment, pt reports he is homeless and he lays his head wherever he can, reports he will need transportation assistance at discharge. TOC consult for Substance Abuse Counseling, pt received SA resources with last hospital contact (05/21/23). NCM will add SA resources to AVS, Resource Guide for Social Services added to AVS. TOC will continue to follow.                   Expected Discharge Plan: Homeless Shelter Barriers to Discharge: Continued Medical Work up   Patient Goals and CMS Choice Patient states their goals for this hospitalization and ongoing recovery are:: uanble to verbalize          Expected Discharge Plan and Services       Living arrangements for the past 2 months: Homeless                                      Prior Living Arrangements/Services Living arrangements for the past 2 months: Homeless   Patient language and need for interpreter reviewed:: Yes Do you feel safe going back to the place where you live?: Yes      Need for Family Participation in Patient Care: Yes (Comment) Care giver support system in place?: Yes (comment)   Criminal Activity/Legal Involvement Pertinent to Current Situation/Hospitalization: No - Comment as needed  Activities of Daily Living      Permission Sought/Granted Permission sought to share information with : Case Manager Permission granted to share information with : Yes, Verbal Permission Granted  Share Information with NAME: Fannie Knee, RN           Emotional Assessment Appearance:: Appears older than stated age Attitude/Demeanor/Rapport: Unable to Assess Affect (typically observed): Unable to  Assess Orientation: : Oriented to Self Alcohol / Substance Use: Alcohol Use Psych Involvement: No (comment)  Admission diagnosis:  Seizure (HCC) [R56.9] Seizure disorder (HCC) [G40.909] Acute alcohol intoxication in patient with alcoholism with blood alcohol level over 0.3, with unspecified complication (HCC) [F10.229] Alcohol-induced acute pancreatitis, unspecified complication status [K85.20] Patient Active Problem List   Diagnosis Date Noted   Protein-calorie malnutrition, severe 12/25/2022   Hypomagnesemia 12/24/2022   Alcohol withdrawal (HCC) 12/23/2022   Seizure disorder (HCC) 12/17/2022   Alcohol-induced pancreatitis 12/14/2022   Delirium tremens (HCC) 10/19/2022   Alcohol abuse with alcohol-induced mood disorder (HCC) 10/16/2022   Alcohol dependence with withdrawal (HCC) 10/16/2022   Acute alcoholic pancreatitis 09/11/2022   AKI (acute kidney injury) (HCC) 05/28/2022   Homelessness 05/28/2022   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   Metabolic acidosis with increased anion gap and accumulation of organic acids 04/16/2022   History of seizures 04/16/2022   Tobacco abuse 04/16/2022   Macrocytosis 12/31/2021   Alcohol-induced insomnia (HCC)    MDD (major depressive disorder), recurrent episode, 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/26/2021   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   Alcohol abuse    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   Malnutrition of moderate degree 05/07/2020   Alcoholic ketoacidosis 05/05/2020   Thrombocytopenia (HCC) 07/25/2019   Transaminitis 07/25/2019   Traumatic subdural hematoma (HCC) 07/02/2019   Alcoholic intoxication with complication (HCC)  07/02/2019   Hypocalcemia 07/02/2019   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   Alcohol use disorder, severe, dependence (HCC) 04/16/2019   Major depressive disorder, 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 [place] [address] Phone: [phone] Fax: [phone]  [place], [place] - [address] AT [facility] [address] [place] [address] Phone: [phone] Fax: [phone]     Social Determinants of Health (SDOH) Social History: SDOH Screenings   Food Insecurity: No Food Insecurity (12/27/2022)  Recent Concern: Food Insecurity - Food Insecurity Present (12/24/2022)  Housing: High Risk (12/27/2022)  Transportation Needs: Unmet Transportation Needs (12/27/2022)  Utilities: Not At Risk (12/27/2022)  Alcohol Screen: High Risk (07/25/2021)  Depression (PHQ2-9): Medium Risk (02/12/2021)  Financial Resource Strain: High Risk (06/30/2022)   Received from [facility]  Social Connections: Unknown (06/24/2022)   Received from [facility]  Stress: No Stress Concern Present (06/30/2022)   Received from [facility]  Recent Concern: Stress - Stress Concern Present (06/25/2022)   Received from [facility]  Tobacco Use: High Risk (05/24/2023)   SDOH Interventions:     Readmission Risk Interventions    05/25/2023    1:34 PM 10/23/2022   11:31 AM 10/23/2022    9:05 AM  Readmission Risk Prevention Plan  Transportation Screening Complete Complete Complete  Medication Review Oceanographer) Complete Complete Complete  PCP or Specialist appointment within 3-5 days of discharge Complete  Complete  HRI or Home Care Consult Complete  Complete  SW Recovery Care/Counseling Consult Complete  Complete  Palliative Care Screening Not Applicable  Not Applicable  Skilled Nursing Facility Not Applicable  Not Applicable

## 2023-05-26 ENCOUNTER — Other Ambulatory Visit (HOSPITAL_COMMUNITY): Payer: Self-pay

## 2023-05-26 ENCOUNTER — Inpatient Hospital Stay (HOSPITAL_COMMUNITY): Payer: MEDICAID

## 2023-05-26 DIAGNOSIS — G40909 Epilepsy, unspecified, not intractable, without status epilepticus: Principal | ICD-10-CM

## 2023-05-26 DIAGNOSIS — F10229 Alcohol dependence with intoxication, unspecified: Secondary | ICD-10-CM | POA: Diagnosis not present

## 2023-05-26 LAB — CBC
HCT: 43.5 % (ref 39.0–52.0)
Hemoglobin: 14.4 g/dL (ref 13.0–17.0)
MCH: 32.1 pg (ref 26.0–34.0)
MCHC: 33.1 g/dL (ref 30.0–36.0)
MCV: 97.1 fL (ref 80.0–100.0)
Platelets: 82 10*3/uL — ABNORMAL LOW (ref 150–400)
RBC: 4.48 MIL/uL (ref 4.22–5.81)
RDW: 15.3 % (ref 11.5–15.5)
WBC: 7 10*3/uL (ref 4.0–10.5)
nRBC: 0 % (ref 0.0–0.2)

## 2023-05-26 LAB — COMPREHENSIVE METABOLIC PANEL
ALT: 29 U/L (ref 0–44)
AST: 38 U/L (ref 15–41)
Albumin: 3.3 g/dL — ABNORMAL LOW (ref 3.5–5.0)
Alkaline Phosphatase: 58 U/L (ref 38–126)
Anion gap: 15 (ref 5–15)
BUN: <5 mg/dL — ABNORMAL LOW (ref 6–20)
CO2: 19 mmol/L — ABNORMAL LOW (ref 22–32)
Calcium: 8.7 mg/dL — ABNORMAL LOW (ref 8.9–10.3)
Chloride: 102 mmol/L (ref 98–111)
Creatinine, Ser: 0.7 mg/dL (ref 0.61–1.24)
GFR, Estimated: >60 mL/min (ref >60)
Glucose, Bld: 121 mg/dL — ABNORMAL HIGH (ref 70–99)
Potassium: 3.5 mmol/L (ref 3.5–5.1)
Sodium: 136 mmol/L (ref 135–145)
Total Bilirubin: 2.4 mg/dL — ABNORMAL HIGH (ref 0.3–1.2)
Total Protein: 6.2 g/dL — ABNORMAL LOW (ref 6.5–8.1)

## 2023-05-26 LAB — BLOOD GAS, VENOUS
Acid-base deficit: 2.3 mmol/L — ABNORMAL HIGH (ref 0.0–2.0)
Bicarbonate: 21.4 mmol/L (ref 20.0–28.0)
O2 Saturation: 94.9 %
Patient temperature: 37.5
pCO2, Ven: 34 mmHg — ABNORMAL LOW (ref 44–60)
pH, Ven: 7.41 (ref 7.25–7.43)
pO2, Ven: 66 mmHg — ABNORMAL HIGH (ref 32–45)

## 2023-05-26 LAB — GLUCOSE, CAPILLARY
Glucose-Capillary: 127 mg/dL — ABNORMAL HIGH (ref 70–99)
Glucose-Capillary: 73 mg/dL (ref 70–99)

## 2023-05-26 LAB — LIPASE, BLOOD: Lipase: 330 U/L — ABNORMAL HIGH (ref 11–51)

## 2023-05-26 LAB — ETHANOL: Alcohol, Ethyl (B): 88 mg/dL — ABNORMAL HIGH (ref <10)

## 2023-05-26 MED ORDER — SODIUM CHLORIDE 0.9 % IV SOLN: INTRAVENOUS | Status: DC

## 2023-05-26 MED ORDER — SODIUM BICARBONATE 8.4 % IV SOLN
INTRAVENOUS | Status: DC
  Filled 2023-05-26: qty 1000
  Filled 2023-05-26: qty 150

## 2023-05-26 MED ORDER — LACTATED RINGERS IV BOLUS
500.0000 mL | Freq: Once | INTRAVENOUS | Status: AC
  Administered 2023-05-26: 500 mL via INTRAVENOUS

## 2023-05-26 NOTE — Plan of Care (Signed)
HR 130-140s-NP Garner Nash aware-x2 bolus LR given.  Problem: Clinical Measurements: Goal: Will remain free from infection Outcome: Progressing   Problem: Safety: Goal: Ability to remain free from injury will improve Outcome: Progressing   Problem: Skin Integrity: Goal: Risk for impaired skin integrity will decrease Outcome: Progressing

## 2023-05-26 NOTE — Progress Notes (Signed)
Tele sitter called this RN and stated the pt needed help opening his milk carton. Upon entering and introducing myself to pt, he stated "don't fucking touch me, I'll kick your ass". Pt restrained in bed appropriately. No acute distress, clammy/sweating visible. Pt continues to speak violently to this RN.

## 2023-05-26 NOTE — Significant Event (Signed)
Rapid Response Event Note   Reason for Call :  Altered LOC   Initial Focused Assessment:  Patient presented lethargic, hemodynamically stable. Patient Alert to loud stimuli. With continous stimuli patient became A/O X4 w/ slurred speech. Patients book bag open, laying beside patient with what looked like a liquor bottle present. Upon further investigation patient had a mostly empty bottle of vodka which held . Patient would not tell staff when he finished this bottle of vodka.   Interventions:  CBG check= 72 Tele sitter ordered  Plan of Care:  Continue to monitor patient for signs of ETOH withdrawal, this occurrence likely pushed the peak of his withdrawal.   MD Notified: Mauro Kaufmann MD Call Time: 0160 Arrival Time: 1550 End Time: 1615   Teresita Madura, RN

## 2023-05-26 NOTE — Progress Notes (Signed)
Asked by primary RN to give PO dose of librium, upon entering room found patient with eyes rolled back, and minimally responsive to stimuli. Writer called for the Rapid RN (who was rounding on the unit) to come and assess the patient. Also notified Primary RN and MD.   See rapid response note.   Orders to hold 1600 dose of librium. Tele sitter monitor ordered.

## 2023-05-26 NOTE — Progress Notes (Signed)
[facility]  PROGRESS NOTE  Gary [name] HQI:[id number] DOB: 05-15-1986 DOA: 05/24/2023 PCP: Patient, No Pcp Per   Brief HPI:   37 year old male with history of HIV, followed by ID, seizure disorder on Keppra, severe EtOH abuse with history of seizures during withdrawal, frequent admissions to ED for alcohol intoxication/withdrawal, PTSD, bipolar 2, schizophrenia, anxiety, history of subdural hematoma, history of EtOH induced pancreatitis came to ED via EMS after he had a seizure episode.  In the field CBG was 63, repeat in the ED was 97. He also complained of abdominal pain CT head showed no acute abnormality CT abdomen/pelvis showed changes of acute pancreatitis, no evidence of pancreatic necrosis or developing pseudocyst currently    Assessment/Plan:    Seizure disorder, exacerbated by alcohol intoxication -Had a seizure in the setting of EtOH intoxication with alcohol level 382 -Also concern for compliance with Keppra -Loaded with Keppra in the ED -No further seizures -Continue IV Keppra 500 mg twice daily  Recurrent pancreatitis, alcohol induced -CT abdomen/pelvis showed changes of acute pancreatitis -Lipase 949, improved to 330 -Continue normal saline at 75 mill per hour -Continue pain control with Dilaudid  High anion gap metabolic acidosis/starvation ketosis -Improved after starting bicarb infusion -Follow bicarb level in a.m.   HIV holding Biktarvy in the setting of acute pancreatitis patient has been noncompliant with his medication prior to admission    homelessness TOC consult    miscellaneous anxiety/bipolar/PTSD-not on any medications prior to admission    prolonged QT on as needed Zofran monitor Follow-up EKG   #9 thrombocytopenia likely secondary to EtOH follow-up levels in a.m. not on heparin or Lovenox.   Estimated body mass index is 20.03 kg/m as calculated from the following:   Height as of this encounter: 6' (1.829 m).   Weight as of this  encounter: 67 kg.  Medications     chlordiazePOXIDE  25 mg Oral TID   folic acid  1 mg Oral Daily   multivitamin with minerals  1 tablet Oral Daily   pantoprazole (PROTONIX) IV  40 mg Intravenous Q12H   sucralfate  1 g Oral TID WC & HS     Data Reviewed:   CBG:  Recent Labs  Lab 05/21/23 0557 05/24/23 1221 05/26/23 0157 05/26/23 1600  GLUCAP 61* 122* 127* 73    SpO2: 98 % O2 Flow Rate (L/min): 2 L/min    Vitals:   05/26/23 0600 05/26/23 1115 05/26/23 1551 05/26/23 1800  BP: (!) 111/92  106/77 104/64  Pulse: (!) 117  (!) 107   Resp: 16     Temp: 98.8 F (37.1 C) 98.7 F (37.1 C)  98.6 F (37 C)  TempSrc: Oral     SpO2: 97%  98%   Weight:      Height:          Data Reviewed:  Basic Metabolic Panel: Recent Labs  Lab 05/21/23 0658 05/24/23 1315 05/24/23 2133 05/25/23 0043 05/25/23 0534 05/25/23 1124 05/25/23 1216 05/25/23 1801 05/26/23 0459  NA 137 139 135   < > 135 138 137 137 136  K 4.0 3.9 4.3   < > 4.0 4.0 4.2 3.8 3.5  CL 100 105 102   < > 102 105 104 103 102  CO2 17* 11* 13*   < > 14* 16* 16* 16* 19*  GLUCOSE 78 105* 168*   < > 148* 132* 133* 135* 121*  BUN 9 16 9    < > 6 <5* <5* <5* <5*  CREATININE 0.68 0.81 0.73   < > 0.69 0.74 0.70 0.74 0.70  CALCIUM 9.0 8.2* 8.1*   < > 8.0* 8.4* 8.5* 8.9 8.7*  MG 1.8 1.9 2.7*  --   --   --   --   --   --   PHOS 4.4  --  4.1  --   --   --   --   --   --    < > = values in this interval not displayed.    CBC: Recent Labs  Lab 05/20/23 1440 05/21/23 0658 05/24/23 1315 05/25/23 0534 05/26/23 0459  WBC 2.6* 2.7* 6.5 8.0 7.0  NEUTROABS 1.1*  --  4.9  --   --   HGB 13.5 12.5* 13.9 14.8 14.4  HCT 39.9 37.4* 43.5 45.6 43.5  MCV 95.2 96.9 100.2* 98.9 97.1  PLT 79* 71* 102* 109* 82*    LFT Recent Labs  Lab 05/21/23 0658 05/24/23 1315 05/25/23 0534 05/25/23 1216 05/26/23 0459  AST 54* 70* 49* 46* 38  ALT 36 44 39 38 29  ALKPHOS 76 79 76 73 58  BILITOT 1.5* 1.3* 1.5* 2.3* 2.4*  PROT 7.4 7.7  7.1 7.2 6.2*  ALBUMIN 4.2 4.3 4.0 4.1 3.3*     Antibiotics: Anti-infectives (From admission, onward)    None        DVT prophylaxis: SCDs  Code Status: Full code  Family Communication:    CONSULTS    Subjective   Very somnolent today, required multiple doses of Ativan for alcohol withdrawal   Objective    Physical Examination:   General-appears lethargic Heart-S1-S2, regular, no murmur auscultated Lungs-clear to auscultation bilaterally, no wheezing or crackles auscultated Abdomen-soft, nontender, no organomegaly Extremities-no edema in the lower extremities Neuro-somnolent but arousable to verbal stimuli   Status is: Inpatient:             [name]   [facility] If 7PM-7AM, please contact night-coverage at [email or web address], Office  [phone]   05/26/2023, 6:26 PM  LOS: 2 days

## 2023-05-26 NOTE — Plan of Care (Signed)
  Problem: Education: Goal: Knowledge of General Education information will improve Description: Including pain rating scale, medication(s)/side effects and non-pharmacologic comfort measures Outcome: Not Progressing   Problem: Health Behavior/Discharge Planning: Goal: Ability to manage health-related needs will improve Outcome: Not Progressing   Problem: Clinical Measurements: Goal: Ability to maintain clinical measurements within normal limits will improve Outcome: Not Progressing Goal: Will remain free from infection Outcome: Not Progressing Goal: Diagnostic test results will improve Outcome: Not Progressing Goal: Respiratory complications will improve Outcome: Not Progressing Goal: Cardiovascular complication will be avoided Outcome: Not Progressing   Problem: Activity: Goal: Risk for activity intolerance will decrease Outcome: Not Progressing   Problem: Nutrition: Goal: Adequate nutrition will be maintained Outcome: Not Progressing   Problem: Elimination: Goal: Will not experience complications related to bowel motility Outcome: Not Progressing Goal: Will not experience complications related to urinary retention Outcome: Not Progressing   Problem: Pain Managment: Goal: General experience of comfort will improve Outcome: Not Progressing   Problem: Safety: Goal: Ability to remain free from injury will improve Outcome: Not Progressing   Problem: Skin Integrity: Goal: Risk for impaired skin integrity will decrease Outcome: Not Progressing   Problem: Safety: Goal: Non-violent Restraint(s) Outcome: Not Progressing

## 2023-05-27 ENCOUNTER — Other Ambulatory Visit: Payer: Self-pay

## 2023-05-27 ENCOUNTER — Inpatient Hospital Stay (HOSPITAL_COMMUNITY): Payer: MEDICAID

## 2023-05-27 DIAGNOSIS — F10229 Alcohol dependence with intoxication, unspecified: Secondary | ICD-10-CM | POA: Diagnosis not present

## 2023-05-27 DIAGNOSIS — M7989 Other specified soft tissue disorders: Secondary | ICD-10-CM

## 2023-05-27 DIAGNOSIS — G40909 Epilepsy, unspecified, not intractable, without status epilepticus: Secondary | ICD-10-CM | POA: Diagnosis not present

## 2023-05-27 LAB — GLUCOSE, CAPILLARY: Glucose-Capillary: 97 mg/dL (ref 70–99)

## 2023-05-27 LAB — COMPREHENSIVE METABOLIC PANEL
ALT: 22 U/L (ref 0–44)
AST: 28 U/L (ref 15–41)
Albumin: 2.7 g/dL — ABNORMAL LOW (ref 3.5–5.0)
Alkaline Phosphatase: 50 U/L (ref 38–126)
Anion gap: 10 (ref 5–15)
BUN: 6 mg/dL (ref 6–20)
CO2: 28 mmol/L (ref 22–32)
Calcium: 8.1 mg/dL — ABNORMAL LOW (ref 8.9–10.3)
Chloride: 94 mmol/L — ABNORMAL LOW (ref 98–111)
Creatinine, Ser: 0.49 mg/dL — ABNORMAL LOW (ref 0.61–1.24)
GFR, Estimated: >60 mL/min (ref >60)
Glucose, Bld: 100 mg/dL — ABNORMAL HIGH (ref 70–99)
Potassium: 2.6 mmol/L — CL (ref 3.5–5.1)
Sodium: 132 mmol/L — ABNORMAL LOW (ref 135–145)
Total Bilirubin: 1.7 mg/dL — ABNORMAL HIGH (ref 0.3–1.2)
Total Protein: 5.3 g/dL — ABNORMAL LOW (ref 6.5–8.1)

## 2023-05-27 LAB — CBC
HCT: 35.4 % — ABNORMAL LOW (ref 39.0–52.0)
Hemoglobin: 12.2 g/dL — ABNORMAL LOW (ref 13.0–17.0)
MCH: 33.2 pg (ref 26.0–34.0)
MCHC: 34.5 g/dL (ref 30.0–36.0)
MCV: 96.5 fL (ref 80.0–100.0)
Platelets: 58 10*3/uL — ABNORMAL LOW (ref 150–400)
RBC: 3.67 MIL/uL — ABNORMAL LOW (ref 4.22–5.81)
RDW: 15.1 % (ref 11.5–15.5)
WBC: 4 10*3/uL (ref 4.0–10.5)
nRBC: 0 % (ref 0.0–0.2)

## 2023-05-27 LAB — MAGNESIUM: Magnesium: 1.6 mg/dL — ABNORMAL LOW (ref 1.7–2.4)

## 2023-05-27 LAB — LIPASE, BLOOD: Lipase: 111 U/L — ABNORMAL HIGH (ref 11–51)

## 2023-05-27 LAB — MRSA NEXT GEN BY PCR, NASAL: MRSA by PCR Next Gen: NOT DETECTED

## 2023-05-27 MED ORDER — MAGNESIUM SULFATE 4 GM/100ML IV SOLN
4.0000 g | Freq: Once | INTRAVENOUS | Status: DC
  Filled 2023-05-27: qty 100

## 2023-05-27 MED ORDER — DEXTROSE-SODIUM CHLORIDE 5-0.9 % IV SOLN: INTRAVENOUS | Status: AC

## 2023-05-27 MED ORDER — POTASSIUM CHLORIDE CRYS ER 20 MEQ PO TBCR
40.0000 meq | EXTENDED_RELEASE_TABLET | Freq: Three times a day (TID) | ORAL | Status: DC
  Administered 2023-05-27: 40 meq via ORAL
  Filled 2023-05-27: qty 2

## 2023-05-27 MED ORDER — MAGNESIUM SULFATE 2 GM/50ML IV SOLN
2.0000 g | Freq: Once | INTRAVENOUS | Status: AC
  Administered 2023-05-27: 2 g via INTRAVENOUS
  Filled 2023-05-27: qty 50

## 2023-05-27 MED ORDER — POTASSIUM CHLORIDE 10 MEQ/100ML IV SOLN
10.0000 meq | INTRAVENOUS | Status: AC
  Administered 2023-05-27 (×5): 10 meq via INTRAVENOUS
  Filled 2023-05-27 (×5): qty 100

## 2023-05-27 NOTE — Progress Notes (Addendum)
[facility]  PROGRESS NOTE  INFANT ZINK UUV:[id number] DOB: Jul 17, 1986 DOA: 05/24/2023 PCP: Patient, No Pcp Per   Brief HPI:   37 year old male with history of HIV, followed by ID, seizure disorder on Keppra, severe EtOH abuse with history of seizures during withdrawal, frequent admissions to ED for alcohol intoxication/withdrawal, PTSD, bipolar 2, schizophrenia, anxiety, history of subdural hematoma, history of EtOH induced pancreatitis came to ED via EMS after he had a seizure episode.  In the field CBG was 63, repeat in the ED was 97. He also complained of abdominal pain CT head showed no acute abnormality CT abdomen/pelvis showed changes of acute pancreatitis, no evidence of pancreatic necrosis or developing pseudocyst currently    Assessment/Plan:    Seizure disorder, exacerbated by alcohol intoxication -Had a seizure in the setting of EtOH intoxication with alcohol level 382 -Also concern for compliance with Keppra -Loaded with Keppra in the ED -No further seizures -Continue IV Keppra 500 mg twice daily  Recurrent pancreatitis, alcohol induced -CT abdomen/pelvis showed changes of acute pancreatitis -Lipase 949, improved to 330 -Continue IV fluids -Continue pain control with Dilaudid  High anion gap metabolic acidosis/starvation ketosis Resolved after starting bicarb infusion -Will switch IV fluids to D5 normal saline  Hypokalemia -Replace potassium and follow BMP in am  Hypomagnesemia -Replace magnesium and follow mag level in a.m.  Left upper extremity swelling -Venous duplex of left upper extremity negative for DVT or superficial thrombophlebitis   HIV holding Biktarvy in the setting of acute pancreatitis patient has been noncompliant with his medication prior to admission    homelessness TOC consult    miscellaneous anxiety/bipolar/PTSD-not on any medications prior to admission    prolonged QT on as needed Zofran monitor Follow-up EKG   #9  thrombocytopenia likely secondary to EtOH follow-up levels in a.m. not on heparin or Lovenox.   Estimated body mass index is 20.03 kg/m as calculated from the following:   Height as of this encounter: 6' (1.829 m).   Weight as of this encounter: 67 kg.  Medications     chlordiazePOXIDE  25 mg Oral TID   folic acid  1 mg Oral Daily   multivitamin with minerals  1 tablet Oral Daily   pantoprazole (PROTONIX) IV  40 mg Intravenous Q12H   potassium chloride  40 mEq Oral TID   sucralfate  1 g Oral TID WC & HS     Data Reviewed:   CBG:  Recent Labs  Lab 05/21/23 0557 05/24/23 1221 05/26/23 0157 05/26/23 1600  GLUCAP 61* 122* 127* 73    SpO2: 97 % O2 Flow Rate (L/min): 2 L/min    Vitals:   05/27/23 0400 05/27/23 0430 05/27/23 0500 05/27/23 0600  BP: 107/73 105/72 107/77 111/69  Pulse: 97 (!) 105 97 (!) 110  Resp: 17  15 (!) 21  Temp:   98.4 F (36.9 C)   TempSrc:      SpO2: 90%  96% 97%  Weight:      Height:          Data Reviewed:  Basic Metabolic Panel: Recent Labs  Lab 05/21/23 0658 05/24/23 1315 05/24/23 2133 05/25/23 0043 05/25/23 1124 05/25/23 1216 05/25/23 1801 05/26/23 0459 05/27/23 0532  NA 137 139 135   < > 138 137 137 136 132*  K 4.0 3.9 4.3   < > 4.0 4.2 3.8 3.5 2.6*  CL 100 105 102   < > 105 104 103 102 94*  CO2 17* 11* 13*   < >  16* 16* 16* 19* 28  GLUCOSE 78 105* 168*   < > 132* 133* 135* 121* 100*  BUN 9 16 9    < > <5* <5* <5* <5* 6  CREATININE 0.68 0.81 0.73   < > 0.74 0.70 0.74 0.70 0.49*  CALCIUM 9.0 8.2* 8.1*   < > 8.4* 8.5* 8.9 8.7* 8.1*  MG 1.8 1.9 2.7*  --   --   --   --   --  1.6*  PHOS 4.4  --  4.1  --   --   --   --   --   --    < > = values in this interval not displayed.    CBC: Recent Labs  Lab 05/20/23 1440 05/21/23 0658 05/24/23 1315 05/25/23 0534 05/26/23 0459 05/27/23 0532  WBC 2.6* 2.7* 6.5 8.0 7.0 4.0  NEUTROABS 1.1*  --  4.9  --   --   --   HGB 13.5 12.5* 13.9 14.8 14.4 12.2*  HCT 39.9 37.4* 43.5 45.6  43.5 35.4*  MCV 95.2 96.9 100.2* 98.9 97.1 96.5  PLT 79* 71* 102* 109* 82* 58*    LFT Recent Labs  Lab 05/24/23 1315 05/25/23 0534 05/25/23 1216 05/26/23 0459 05/27/23 0532  AST 70* 49* 46* 38 28  ALT 44 39 38 29 22  ALKPHOS 79 76 73 58 50  BILITOT 1.3* 1.5* 2.3* 2.4* 1.7*  PROT 7.7 7.1 7.2 6.2* 5.3*  ALBUMIN 4.3 4.0 4.1 3.3* 2.7*     Antibiotics: Anti-infectives (From admission, onward)    None        DVT prophylaxis: SCDs  Code Status: Full code  Family Communication:    CONSULTS    Subjective   Looks better today.  Still somnolent but arousable.  Left upper extremity venous duplex was negative for DVT or superficial thrombophlebitis.   Objective    Physical Examination:   General-appears in no acute distress Heart-S1-S2, regular, no murmur auscultated Lungs-clear to auscultation bilaterally, no wheezing or crackles auscultated Abdomen-soft, nontender, no organomegaly Extremities-no edema in the lower extremities Neuro-alert, oriented x3, no focal deficit noted   Status is: Inpatient:             [name]   [facility] If 7PM-7AM, please contact night-coverage at [email or web address], Office  [phone]   05/27/2023, 10:20 AM  LOS: 3 days

## 2023-05-27 NOTE — Progress Notes (Signed)
Left upper extremity venous duplex has been completed. Preliminary results can be found in CV Proc through chart review.   05/27/23 8:36 AM Olen Cordial RVT

## 2023-05-27 NOTE — Plan of Care (Signed)
  Problem: Education: Goal: Knowledge of General Education information will improve Description: Including pain rating scale, medication(s)/side effects and non-pharmacologic comfort measures Outcome: Not Progressing   Problem: Health Behavior/Discharge Planning: Goal: Ability to manage health-related needs will improve Outcome: Not Progressing   Problem: Clinical Measurements: Goal: Ability to maintain clinical measurements within normal limits will improve Outcome: Not Progressing   Problem: Pain Managment: Goal: General experience of comfort will improve Outcome: Not Progressing

## 2023-05-28 DIAGNOSIS — G40909 Epilepsy, unspecified, not intractable, without status epilepticus: Secondary | ICD-10-CM | POA: Diagnosis not present

## 2023-05-28 DIAGNOSIS — F10229 Alcohol dependence with intoxication, unspecified: Secondary | ICD-10-CM | POA: Diagnosis not present

## 2023-05-28 DIAGNOSIS — K852 Alcohol induced acute pancreatitis without necrosis or infection: Secondary | ICD-10-CM | POA: Diagnosis not present

## 2023-05-28 LAB — CBC
HCT: 34.7 % — ABNORMAL LOW (ref 39.0–52.0)
Hemoglobin: 11.3 g/dL — ABNORMAL LOW (ref 13.0–17.0)
MCH: 31.8 pg (ref 26.0–34.0)
MCHC: 32.6 g/dL (ref 30.0–36.0)
MCV: 97.7 fL (ref 80.0–100.0)
Platelets: 63 10*3/uL — ABNORMAL LOW (ref 150–400)
RBC: 3.55 MIL/uL — ABNORMAL LOW (ref 4.22–5.81)
RDW: 14.6 % (ref 11.5–15.5)
WBC: 2.9 10*3/uL — ABNORMAL LOW (ref 4.0–10.5)
nRBC: 0 % (ref 0.0–0.2)

## 2023-05-28 LAB — COMPREHENSIVE METABOLIC PANEL
ALT: 23 U/L (ref 0–44)
AST: 32 U/L (ref 15–41)
Albumin: 2.7 g/dL — ABNORMAL LOW (ref 3.5–5.0)
Alkaline Phosphatase: 46 U/L (ref 38–126)
Anion gap: 6 (ref 5–15)
BUN: 5 mg/dL — ABNORMAL LOW (ref 6–20)
CO2: 31 mmol/L (ref 22–32)
Calcium: 8.4 mg/dL — ABNORMAL LOW (ref 8.9–10.3)
Chloride: 97 mmol/L — ABNORMAL LOW (ref 98–111)
Creatinine, Ser: 0.56 mg/dL — ABNORMAL LOW (ref 0.61–1.24)
GFR, Estimated: >60 mL/min (ref >60)
Glucose, Bld: 115 mg/dL — ABNORMAL HIGH (ref 70–99)
Potassium: 2.9 mmol/L — ABNORMAL LOW (ref 3.5–5.1)
Sodium: 134 mmol/L — ABNORMAL LOW (ref 135–145)
Total Bilirubin: 0.7 mg/dL (ref 0.3–1.2)
Total Protein: 5.6 g/dL — ABNORMAL LOW (ref 6.5–8.1)

## 2023-05-28 LAB — LIPASE, BLOOD: Lipase: 79 U/L — ABNORMAL HIGH (ref 11–51)

## 2023-05-28 LAB — MAGNESIUM: Magnesium: 1.9 mg/dL (ref 1.7–2.4)

## 2023-05-28 MED ORDER — POTASSIUM CHLORIDE 10 MEQ/100ML IV SOLN
INTRAVENOUS | Status: AC
  Filled 2023-05-28: qty 100

## 2023-05-28 MED ORDER — DEXTROSE-SODIUM CHLORIDE 5-0.9 % IV SOLN: INTRAVENOUS | Status: DC

## 2023-05-28 MED ORDER — LORAZEPAM 2 MG/ML IJ SOLN
1.0000 mg | INTRAMUSCULAR | Status: AC | PRN
  Administered 2023-05-28: 3 mg via INTRAVENOUS

## 2023-05-28 MED ORDER — POTASSIUM CHLORIDE 10 MEQ/100ML IV SOLN
10.0000 meq | INTRAVENOUS | Status: AC
  Administered 2023-05-28 (×5): 10 meq via INTRAVENOUS
  Filled 2023-05-28: qty 100

## 2023-05-28 MED ORDER — LORAZEPAM 1 MG PO TABS
1.0000 mg | ORAL_TABLET | ORAL | Status: AC | PRN
  Administered 2023-05-29: 2 mg via ORAL
  Administered 2023-05-29: 3 mg via ORAL
  Filled 2023-05-28: qty 3
  Filled 2023-05-28: qty 2

## 2023-05-28 NOTE — Progress Notes (Addendum)
[facility]  PROGRESS NOTE  Gary [name] WUX:[id number] DOB: 1986/08/15 DOA: 05/24/2023 PCP: Patient, No Pcp Per   Brief HPI:   37 year old male with history of HIV, followed by ID, seizure disorder on Keppra, severe EtOH abuse with history of seizures during withdrawal, frequent admissions to ED for alcohol intoxication/withdrawal, PTSD, bipolar 2, schizophrenia, anxiety, history of subdural hematoma, history of EtOH induced pancreatitis came to ED via EMS after he had a seizure episode.  In the field CBG was 63, repeat in the ED was 97. He also complained of abdominal pain CT head showed no acute abnormality CT abdomen/pelvis showed changes of acute pancreatitis, no evidence of pancreatic necrosis or developing pseudocyst currently    Assessment/Plan:    Seizure disorder, exacerbated by alcohol intoxication -Had a seizure in the setting of EtOH intoxication with alcohol level 382 -Also concern for compliance with Keppra -Loaded with Keppra in the ED -No further seizures -Continue IV Keppra 500 mg twice daily  Recurrent pancreatitis, alcohol induced -CT abdomen/pelvis showed changes of acute pancreatitis -Lipase 949, improved to 79 -Continue IV fluids -Continue pain control with Dilaudid  High anion gap metabolic acidosis/starvation ketosis Resolved after starting bicarb infusion -Will switch IV fluids to D5 normal saline  Hypokalemia -Replace potassium and follow BMP in am  Hypomagnesemia -Replete  Left upper extremity swelling -Venous duplex of left upper extremity negative for DVT or superficial thrombophlebitis   HIV holding Biktarvy in the setting of acute pancreatitis patient has been noncompliant with his medication prior to admission    homelessness TOC consult    miscellaneous anxiety/bipolar/PTSD-not on any medications prior to admission    prolonged QT on as needed Zofran monitor Follow-up EKG   #9 thrombocytopenia likely secondary to EtOH  follow-up levels in a.m. not on heparin or Lovenox.   Estimated body mass index is 20.03 kg/m as calculated from the following:   Height as of this encounter: 6' (1.829 m).   Weight as of this encounter: 67 kg.  Medications     chlordiazePOXIDE  25 mg Oral TID   folic acid  1 mg Oral Daily   multivitamin with minerals  1 tablet Oral Daily   pantoprazole (PROTONIX) IV  40 mg Intravenous Q12H   sucralfate  1 g Oral TID WC & HS     Data Reviewed:   CBG:  Recent Labs  Lab 05/24/23 1221 05/26/23 0157 05/26/23 1600 05/27/23 1622  GLUCAP 122* 127* 73 97    SpO2: 96 % O2 Flow Rate (L/min): 2 L/min    Vitals:   05/28/23 0700 05/28/23 0720 05/28/23 0800 05/28/23 1000  BP:   123/79 100/70  Pulse: 95 96 91 97  Resp:  (!) 22 16 19   Temp:      TempSrc:      SpO2:  95% 98% 96%  Weight:      Height:          Data Reviewed:  Basic Metabolic Panel: Recent Labs  Lab 05/24/23 1315 05/24/23 2133 05/25/23 0043 05/25/23 1216 05/25/23 1801 05/26/23 0459 05/27/23 0532 05/28/23 0539  NA 139 135   < > 137 137 136 132* 134*  K 3.9 4.3   < > 4.2 3.8 3.5 2.6* 2.9*  CL 105 102   < > 104 103 102 94* 97*  CO2 11* 13*   < > 16* 16* 19* 28 31  GLUCOSE 105* 168*   < > 133* 135* 121* 100* 115*  BUN 16 9   < > <  5* <5* <5* 6 5*  CREATININE 0.81 0.73   < > 0.70 0.74 0.70 0.49* 0.56*  CALCIUM 8.2* 8.1*   < > 8.5* 8.9 8.7* 8.1* 8.4*  MG 1.9 2.7*  --   --   --   --  1.6* 1.9  PHOS  --  4.1  --   --   --   --   --   --    < > = values in this interval not displayed.    CBC: Recent Labs  Lab 05/24/23 1315 05/25/23 0534 05/26/23 0459 05/27/23 0532 05/28/23 0539  WBC 6.5 8.0 7.0 4.0 2.9*  NEUTROABS 4.9  --   --   --   --   HGB 13.9 14.8 14.4 12.2* 11.3*  HCT 43.5 45.6 43.5 35.4* 34.7*  MCV 100.2* 98.9 97.1 96.5 97.7  PLT 102* 109* 82* 58* 63*    LFT Recent Labs  Lab 05/25/23 0534 05/25/23 1216 05/26/23 0459 05/27/23 0532 05/28/23 0539  AST 49* 46* 38 28 32  ALT 39  38 29 22 23   ALKPHOS 76 73 58 50 46  BILITOT 1.5* 2.3* 2.4* 1.7* 0.7  PROT 7.1 7.2 6.2* 5.3* 5.6*  ALBUMIN 4.0 4.1 3.3* 2.7* 2.7*     Antibiotics: Anti-infectives (From admission, onward)    None        DVT prophylaxis: SCDs  Code Status: Full code  Family Communication:    CONSULTS    Subjective    He is more alert and communicative today.  Pain adequately controlled with pain medications.  Objective    Physical Examination:   General-appears in no acute distress Heart-S1-S2, regular, no murmur auscultated Lungs-clear to auscultation bilaterally, no wheezing or crackles auscultated Abdomen-soft, nontender, no organomegaly Extremities-no edema in the lower extremities Neuro-alert, oriented x3, no focal deficit noted   Status is: Inpatient:             [name]   [facility] If 7PM-7AM, please contact night-coverage at [email or web address], Office  [phone]   05/28/2023, 11:16 AM  LOS: 4 days

## 2023-05-28 NOTE — Progress Notes (Signed)
Pt stated he accidentally "broke" his penis during the intercourse a few months ago, still feels pain when fully erected. Pt wishes to be checked by a doctor while he is in the hospital.

## 2023-05-29 DIAGNOSIS — F10229 Alcohol dependence with intoxication, unspecified: Secondary | ICD-10-CM

## 2023-05-29 DIAGNOSIS — G40909 Epilepsy, unspecified, not intractable, without status epilepticus: Secondary | ICD-10-CM | POA: Diagnosis not present

## 2023-05-29 LAB — COMPREHENSIVE METABOLIC PANEL
ALT: 35 U/L (ref 0–44)
AST: 48 U/L — ABNORMAL HIGH (ref 15–41)
Albumin: 3 g/dL — ABNORMAL LOW (ref 3.5–5.0)
Alkaline Phosphatase: 49 U/L (ref 38–126)
Anion gap: 6 (ref 5–15)
BUN: <5 mg/dL — ABNORMAL LOW (ref 6–20)
CO2: 28 mmol/L (ref 22–32)
Calcium: 8.3 mg/dL — ABNORMAL LOW (ref 8.9–10.3)
Chloride: 99 mmol/L (ref 98–111)
Creatinine, Ser: 0.46 mg/dL — ABNORMAL LOW (ref 0.61–1.24)
GFR, Estimated: >60 mL/min (ref >60)
Glucose, Bld: 106 mg/dL — ABNORMAL HIGH (ref 70–99)
Potassium: 3.2 mmol/L — ABNORMAL LOW (ref 3.5–5.1)
Sodium: 133 mmol/L — ABNORMAL LOW (ref 135–145)
Total Bilirubin: 0.8 mg/dL (ref 0.3–1.2)
Total Protein: 6.1 g/dL — ABNORMAL LOW (ref 6.5–8.1)

## 2023-05-29 LAB — CBC
HCT: 36.4 % — ABNORMAL LOW (ref 39.0–52.0)
Hemoglobin: 12.2 g/dL — ABNORMAL LOW (ref 13.0–17.0)
MCH: 32.6 pg (ref 26.0–34.0)
MCHC: 33.5 g/dL (ref 30.0–36.0)
MCV: 97.3 fL (ref 80.0–100.0)
Platelets: 93 10*3/uL — ABNORMAL LOW (ref 150–400)
RBC: 3.74 MIL/uL — ABNORMAL LOW (ref 4.22–5.81)
RDW: 14.6 % (ref 11.5–15.5)
WBC: 2.5 10*3/uL — ABNORMAL LOW (ref 4.0–10.5)
nRBC: 0 % (ref 0.0–0.2)

## 2023-05-29 LAB — LIPASE, BLOOD: Lipase: 76 U/L — ABNORMAL HIGH (ref 11–51)

## 2023-05-29 MED ORDER — CHLORDIAZEPOXIDE HCL 25 MG PO CAPS
25.0000 mg | ORAL_CAPSULE | Freq: Two times a day (BID) | ORAL | Status: DC
  Filled 2023-05-29: qty 1

## 2023-05-29 MED ORDER — LORAZEPAM 2 MG/ML IJ SOLN
1.0000 mg | INTRAMUSCULAR | Status: DC | PRN
  Administered 2023-05-29: 2 mg via INTRAVENOUS
  Administered 2023-05-29: 3 mg via INTRAVENOUS
  Filled 2023-05-29: qty 1
  Filled 2023-05-29: qty 2

## 2023-05-29 MED ORDER — LEVETIRACETAM 500 MG PO TABS
500.0000 mg | ORAL_TABLET | Freq: Two times a day (BID) | ORAL | Status: DC
  Administered 2023-05-29: 500 mg via ORAL
  Filled 2023-05-29: qty 1

## 2023-05-29 MED ORDER — POTASSIUM CHLORIDE 20 MEQ PO PACK
40.0000 meq | PACK | Freq: Two times a day (BID) | ORAL | Status: DC
  Administered 2023-05-29: 40 meq via ORAL
  Filled 2023-05-29: qty 2

## 2023-05-29 MED ORDER — LORAZEPAM 1 MG PO TABS: 1.0000 mg | ORAL_TABLET | ORAL | Status: DC | PRN

## 2023-05-29 MED ORDER — DEXTROSE-SODIUM CHLORIDE 5-0.9 % IV SOLN: INTRAVENOUS | Status: DC

## 2023-05-29 NOTE — Progress Notes (Signed)
Patient asking to leave, so he can drink alcohol. MD notified, came to bedside. Patient is A/OX4. AMA signed by patient and Clinical research associate. IV removed.

## 2023-05-29 NOTE — Plan of Care (Signed)
?  Problem: Education: ?Goal: Knowledge of General Education information will improve ?Description: Including pain rating scale, medication(s)/side effects and non-pharmacologic comfort measures ?Outcome: Not Progressing ?  ?Problem: Health Behavior/Discharge Planning: ?Goal: Ability to manage health-related needs will improve ?Outcome: Not Progressing ?  ?Problem: Clinical Measurements: ?Goal: Ability to maintain clinical measurements within normal limits will improve ?Outcome: Not Progressing ?  ?Problem: Activity: ?Goal: Risk for activity intolerance will decrease ?Outcome: Not Progressing ?  ?Problem: Nutrition: ?Goal: Adequate nutrition will be maintained ?Outcome: Not Progressing ?  ?

## 2023-05-29 NOTE — Progress Notes (Signed)
[facility]  PROGRESS NOTE  Gary [name] ZOX:[id number] DOB: 1986/06/27 DOA: 05/24/2023 PCP: Patient, No Pcp Per   Brief HPI:   37 year old male with history of HIV, followed by ID, seizure disorder on Keppra, severe EtOH abuse with history of seizures during withdrawal, frequent admissions to ED for alcohol intoxication/withdrawal, PTSD, bipolar 2, schizophrenia, anxiety, history of subdural hematoma, history of EtOH induced pancreatitis came to ED via EMS after he had a seizure episode.  In the field CBG was 63, repeat in the ED was 97. He also complained of abdominal pain CT head showed no acute abnormality CT abdomen/pelvis showed changes of acute pancreatitis, no evidence of pancreatic necrosis or developing pseudocyst currently    Assessment/Plan:    Seizure disorder, exacerbated by alcohol intoxication -Had a seizure in the setting of EtOH intoxication with alcohol level 382 -Also concern for compliance with Keppra -Loaded with Keppra in the ED -No further seizures -Continue po  Keppra 500 mg twice daily -He is currently on Librium 25 3 times daily, will wean down to 25 twice daily and stop Librium in next 2 days  Recurrent pancreatitis, alcohol induced -CT abdomen/pelvis showed changes of acute pancreatitis -Lipase 949, improved to 76 -Continue IV fluids -Continue pain control with Dilaudid  High anion gap metabolic acidosis/starvation ketosis Resolved after starting bicarb infusion -Will switch IV fluids to D5 normal saline  Hypokalemia -Replace potassium and follow BMP in am  Hypomagnesemia -Replete  Left upper extremity swelling -Venous duplex of left upper extremity negative for DVT or superficial thrombophlebitis   HIV holding Biktarvy in the setting of acute pancreatitis patient has been noncompliant with his medication prior to admission    homelessness TOC consult    miscellaneous anxiety/bipolar/PTSD-not on any medications prior to admission     prolonged QT on as needed Zofran monitor Follow-up EKG   #9 thrombocytopenia likely secondary to EtOH follow-up levels in a.m. not on heparin or Lovenox.   Estimated body mass index is 20.03 kg/m as calculated from the following:   Height as of this encounter: 6' (1.829 m).   Weight as of this encounter: 67 kg.  Medications     chlordiazePOXIDE  25 mg Oral TID   folic acid  1 mg Oral Daily   levETIRAcetam  500 mg Oral BID   multivitamin with minerals  1 tablet Oral Daily   pantoprazole (PROTONIX) IV  40 mg Intravenous Q12H   potassium chloride  40 mEq Oral BID   sucralfate  1 g Oral TID WC & HS     Data Reviewed:   CBG:  Recent Labs  Lab 05/24/23 1221 05/26/23 0157 05/26/23 1600 05/27/23 1622  GLUCAP 122* 127* 73 97    SpO2: 92 % O2 Flow Rate (L/min): 2 L/min    Vitals:   05/29/23 0200 05/29/23 0400 05/29/23 0555 05/29/23 0850  BP: 117/86 (!) 92/59  115/77  Pulse: 97 96  100  Resp: 12 16  16   Temp:   98.2 F (36.8 C) 98.5 F (36.9 C)  TempSrc:    Oral  SpO2: 100% 93%  92%  Weight:      Height:          Data Reviewed:  Basic Metabolic Panel: Recent Labs  Lab 05/24/23 1315 05/24/23 2133 05/25/23 0043 05/25/23 1801 05/26/23 0459 05/27/23 0532 05/28/23 0539 05/29/23 0518  NA 139 135   < > 137 136 132* 134* 133*  K 3.9 4.3   < > 3.8 3.5 2.6*  2.9* 3.2*  CL 105 102   < > 103 102 94* 97* 99  CO2 11* 13*   < > 16* 19* 28 31 28   GLUCOSE 105* 168*   < > 135* 121* 100* 115* 106*  BUN 16 9   < > <5* <5* 6 5* <5*  CREATININE 0.81 0.73   < > 0.74 0.70 0.49* 0.56* 0.46*  CALCIUM 8.2* 8.1*   < > 8.9 8.7* 8.1* 8.4* 8.3*  MG 1.9 2.7*  --   --   --  1.6* 1.9  --   PHOS  --  4.1  --   --   --   --   --   --    < > = values in this interval not displayed.    CBC: Recent Labs  Lab 05/24/23 1315 05/25/23 0534 05/26/23 0459 05/27/23 0532 05/28/23 0539 05/29/23 0518  WBC 6.5 8.0 7.0 4.0 2.9* 2.5*  NEUTROABS 4.9  --   --   --   --   --   HGB 13.9  14.8 14.4 12.2* 11.3* 12.2*  HCT 43.5 45.6 43.5 35.4* 34.7* 36.4*  MCV 100.2* 98.9 97.1 96.5 97.7 97.3  PLT 102* 109* 82* 58* 63* 93*    LFT Recent Labs  Lab 05/25/23 1216 05/26/23 0459 05/27/23 0532 05/28/23 0539 05/29/23 0518  AST 46* 38 28 32 48*  ALT 38 29 22 23  35  ALKPHOS 73 58 50 46 49  BILITOT 2.3* 2.4* 1.7* 0.7 0.8  PROT 7.2 6.2* 5.3* 5.6* 6.1*  ALBUMIN 4.1 3.3* 2.7* 2.7* 3.0*     Antibiotics: Anti-infectives (From admission, onward)    None        DVT prophylaxis: SCDs  Code Status: Full code  Family Communication:    CONSULTS    Subjective   Patient seen and examined, pain well-controlled.  He is more alert today   Objective    Physical Examination:  General-appears in no acute distress Heart-S1-S2, regular, no murmur auscultated Lungs-clear to auscultation bilaterally, no wheezing or crackles auscultated Abdomen-soft, nontender, no organomegaly Extremities-no edema in the lower extremities Neuro-alert, oriented x3, no focal deficit noted   Status is: Inpatient:             [name]   [facility] If 7PM-7AM, please contact night-coverage at [email or web address], Office  [phone]   05/29/2023, 10:14 AM  LOS: 5 days

## 2023-05-31 ENCOUNTER — Other Ambulatory Visit (HOSPITAL_COMMUNITY): Payer: Self-pay

## 2023-06-11 ENCOUNTER — Inpatient Hospital Stay (HOSPITAL_COMMUNITY)
Admission: EM | Admit: 2023-06-11 | Discharge: 2023-06-15 | DRG: 894 | Discharge status: Against Medical Advice | Payer: MEDICAID | Attending: Internal Medicine | Admitting: Internal Medicine

## 2023-06-11 ENCOUNTER — Other Ambulatory Visit: Payer: Self-pay

## 2023-06-11 ENCOUNTER — Encounter (HOSPITAL_COMMUNITY): Payer: Self-pay

## 2023-06-11 ENCOUNTER — Inpatient Hospital Stay (HOSPITAL_COMMUNITY): Payer: MEDICAID

## 2023-06-11 DIAGNOSIS — F10931 Alcohol use, unspecified with withdrawal delirium: Secondary | ICD-10-CM | POA: Diagnosis not present

## 2023-06-11 DIAGNOSIS — F10939 Alcohol use, unspecified with withdrawal, unspecified: Secondary | ICD-10-CM | POA: Diagnosis present

## 2023-06-11 DIAGNOSIS — R109 Unspecified abdominal pain: Secondary | ICD-10-CM

## 2023-06-11 DIAGNOSIS — F10229 Alcohol dependence with intoxication, unspecified: Secondary | ICD-10-CM | POA: Diagnosis present

## 2023-06-11 DIAGNOSIS — Z811 Family history of alcohol abuse and dependence: Secondary | ICD-10-CM | POA: Diagnosis not present

## 2023-06-11 DIAGNOSIS — F10930 Alcohol use, unspecified with withdrawal, uncomplicated: Secondary | ICD-10-CM | POA: Diagnosis present

## 2023-06-11 DIAGNOSIS — F17210 Nicotine dependence, cigarettes, uncomplicated: Secondary | ICD-10-CM | POA: Diagnosis present

## 2023-06-11 DIAGNOSIS — Z5329 Procedure and treatment not carried out because of patient's decision for other reasons: Secondary | ICD-10-CM | POA: Diagnosis present

## 2023-06-11 DIAGNOSIS — G40909 Epilepsy, unspecified, not intractable, without status epilepticus: Secondary | ICD-10-CM | POA: Diagnosis present

## 2023-06-11 DIAGNOSIS — K709 Alcoholic liver disease, unspecified: Secondary | ICD-10-CM | POA: Diagnosis present

## 2023-06-11 DIAGNOSIS — Z21 Asymptomatic human immunodeficiency virus [HIV] infection status: Secondary | ICD-10-CM | POA: Diagnosis present

## 2023-06-11 DIAGNOSIS — E8729 Other acidosis: Secondary | ICD-10-CM | POA: Diagnosis present

## 2023-06-11 DIAGNOSIS — F10231 Alcohol dependence with withdrawal delirium: Principal | ICD-10-CM | POA: Diagnosis present

## 2023-06-11 DIAGNOSIS — E876 Hypokalemia: Secondary | ICD-10-CM | POA: Diagnosis not present

## 2023-06-11 DIAGNOSIS — F4310 Post-traumatic stress disorder, unspecified: Secondary | ICD-10-CM | POA: Diagnosis present

## 2023-06-11 DIAGNOSIS — Z91199 Patient's noncompliance with other medical treatment and regimen due to unspecified reason: Secondary | ICD-10-CM

## 2023-06-11 DIAGNOSIS — K852 Alcohol induced acute pancreatitis without necrosis or infection: Secondary | ICD-10-CM | POA: Diagnosis present

## 2023-06-11 DIAGNOSIS — R110 Nausea: Secondary | ICD-10-CM

## 2023-06-11 DIAGNOSIS — Z5901 Sheltered homelessness: Secondary | ICD-10-CM | POA: Diagnosis not present

## 2023-06-11 DIAGNOSIS — Y908 Blood alcohol level of 240 mg/100 ml or more: Secondary | ICD-10-CM | POA: Diagnosis present

## 2023-06-11 DIAGNOSIS — Z800 Family history of malignant neoplasm of digestive organs: Secondary | ICD-10-CM | POA: Diagnosis not present

## 2023-06-11 DIAGNOSIS — K2920 Alcoholic gastritis without bleeding: Secondary | ICD-10-CM | POA: Diagnosis present

## 2023-06-11 DIAGNOSIS — R54 Age-related physical debility: Secondary | ICD-10-CM | POA: Diagnosis present

## 2023-06-11 DIAGNOSIS — Z79899 Other long term (current) drug therapy: Secondary | ICD-10-CM | POA: Diagnosis not present

## 2023-06-11 LAB — COMPREHENSIVE METABOLIC PANEL
ALT: 90 U/L — ABNORMAL HIGH (ref 0–44)
AST: 215 U/L — ABNORMAL HIGH (ref 15–41)
Albumin: 4.4 g/dL (ref 3.5–5.0)
Alkaline Phosphatase: 93 U/L (ref 38–126)
Anion gap: 23 — ABNORMAL HIGH (ref 5–15)
BUN: 12 mg/dL (ref 6–20)
CO2: 14 mmol/L — ABNORMAL LOW (ref 22–32)
Calcium: 8.5 mg/dL — ABNORMAL LOW (ref 8.9–10.3)
Chloride: 105 mmol/L (ref 98–111)
Creatinine, Ser: 0.94 mg/dL (ref 0.61–1.24)
GFR, Estimated: >60 mL/min (ref >60)
Glucose, Bld: 61 mg/dL — ABNORMAL LOW (ref 70–99)
Potassium: 4.7 mmol/L (ref 3.5–5.1)
Sodium: 142 mmol/L (ref 135–145)
Total Bilirubin: 0.9 mg/dL (ref 0.3–1.2)
Total Protein: 8.1 g/dL (ref 6.5–8.1)

## 2023-06-11 LAB — RAPID URINE DRUG SCREEN, HOSP PERFORMED
Amphetamines: NOT DETECTED
Barbiturates: NOT DETECTED
Benzodiazepines: POSITIVE — AB
Cocaine: NOT DETECTED
Opiates: NOT DETECTED
Tetrahydrocannabinol: NOT DETECTED

## 2023-06-11 LAB — CBC
HCT: 43.6 % (ref 39.0–52.0)
Hemoglobin: 14 g/dL (ref 13.0–17.0)
MCH: 32.4 pg (ref 26.0–34.0)
MCHC: 32.1 g/dL (ref 30.0–36.0)
MCV: 100.9 fL — ABNORMAL HIGH (ref 80.0–100.0)
Platelets: 251 10*3/uL (ref 150–400)
RBC: 4.32 MIL/uL (ref 4.22–5.81)
RDW: 16 % — ABNORMAL HIGH (ref 11.5–15.5)
WBC: 5.2 10*3/uL (ref 4.0–10.5)
nRBC: 0 % (ref 0.0–0.2)

## 2023-06-11 LAB — CBG MONITORING, ED
Glucose-Capillary: 143 mg/dL — ABNORMAL HIGH (ref 70–99)
Glucose-Capillary: 54 mg/dL — ABNORMAL LOW (ref 70–99)
Glucose-Capillary: 55 mg/dL — ABNORMAL LOW (ref 70–99)

## 2023-06-11 LAB — GLUCOSE, CAPILLARY: Glucose-Capillary: 137 mg/dL — ABNORMAL HIGH (ref 70–99)

## 2023-06-11 LAB — MRSA NEXT GEN BY PCR, NASAL: MRSA by PCR Next Gen: NOT DETECTED

## 2023-06-11 LAB — LIPASE, BLOOD: Lipase: 112 U/L — ABNORMAL HIGH (ref 11–51)

## 2023-06-11 LAB — VOLATILES,BLD-ACETONE,ETHANOL,ISOPROP,METHANOL
Acetone, blood: <0.01 g/dL (ref 0.000–0.010)
Ethanol, blood: 0.121 g/dL — ABNORMAL HIGH (ref 0.000–0.010)
Isopropanol, blood: <0.01 g/dL (ref 0.000–0.010)
Methanol, blood: <0.01 g/dL (ref 0.000–0.010)

## 2023-06-11 LAB — ETHANOL: Alcohol, Ethyl (B): 317 mg/dL (ref <10)

## 2023-06-11 MED ORDER — LACTATED RINGERS IV BOLUS
1000.0000 mL | Freq: Once | INTRAVENOUS | Status: AC
  Administered 2023-06-11: 1000 mL via INTRAVENOUS

## 2023-06-11 MED ORDER — HYDROXYZINE HCL 25 MG PO TABS
25.0000 mg | ORAL_TABLET | Freq: Four times a day (QID) | ORAL | Status: DC | PRN
  Administered 2023-06-11 – 2023-06-12 (×3): 25 mg via ORAL
  Filled 2023-06-11 (×3): qty 1

## 2023-06-11 MED ORDER — LORAZEPAM 2 MG/ML IJ SOLN
1.0000 mg | INTRAMUSCULAR | Status: DC | PRN
  Administered 2023-06-11: 2 mg via INTRAVENOUS
  Filled 2023-06-11: qty 1

## 2023-06-11 MED ORDER — TRAZODONE HCL 50 MG PO TABS
25.0000 mg | ORAL_TABLET | Freq: Every evening | ORAL | Status: DC | PRN
  Administered 2023-06-14: 25 mg via ORAL
  Filled 2023-06-11: qty 1

## 2023-06-11 MED ORDER — ONDANSETRON 4 MG PO TBDP: 4.0000 mg | ORAL_TABLET | Freq: Four times a day (QID) | ORAL | Status: DC | PRN

## 2023-06-11 MED ORDER — ONDANSETRON HCL 4 MG/2ML IJ SOLN
4.0000 mg | Freq: Four times a day (QID) | INTRAMUSCULAR | Status: DC | PRN
  Administered 2023-06-11 – 2023-06-15 (×8): 4 mg via INTRAVENOUS
  Filled 2023-06-11 (×9): qty 2

## 2023-06-11 MED ORDER — THIAMINE MONONITRATE 100 MG PO TABS
100.0000 mg | ORAL_TABLET | Freq: Every day | ORAL | Status: DC
  Administered 2023-06-11 – 2023-06-15 (×4): 100 mg via ORAL
  Filled 2023-06-11 (×5): qty 1

## 2023-06-11 MED ORDER — ADULT MULTIVITAMIN W/MINERALS CH
1.0000 | ORAL_TABLET | Freq: Every day | ORAL | Status: DC
  Administered 2023-06-11 – 2023-06-15 (×4): 1 via ORAL
  Filled 2023-06-11 (×5): qty 1

## 2023-06-11 MED ORDER — SENNOSIDES-DOCUSATE SODIUM 8.6-50 MG PO TABS: 1.0000 | ORAL_TABLET | Freq: Every evening | ORAL | Status: DC | PRN

## 2023-06-11 MED ORDER — ALBUTEROL SULFATE (2.5 MG/3ML) 0.083% IN NEBU: 2.5000 mg | INHALATION_SOLUTION | RESPIRATORY_TRACT | Status: DC | PRN

## 2023-06-11 MED ORDER — THIAMINE HCL 100 MG/ML IJ SOLN
100.0000 mg | Freq: Every day | INTRAMUSCULAR | Status: DC
  Filled 2023-06-11: qty 2

## 2023-06-11 MED ORDER — CHLORDIAZEPOXIDE HCL 25 MG PO CAPS
25.0000 mg | ORAL_CAPSULE | ORAL | Status: DC
  Administered 2023-06-13: 25 mg via ORAL
  Filled 2023-06-11: qty 1

## 2023-06-11 MED ORDER — LEVETIRACETAM 500 MG PO TABS
500.0000 mg | ORAL_TABLET | Freq: Two times a day (BID) | ORAL | Status: DC
  Administered 2023-06-11 – 2023-06-15 (×8): 500 mg via ORAL
  Filled 2023-06-11 (×8): qty 1

## 2023-06-11 MED ORDER — FOLIC ACID 1 MG PO TABS
1.0000 mg | ORAL_TABLET | Freq: Every day | ORAL | Status: DC
  Administered 2023-06-11 – 2023-06-15 (×4): 1 mg via ORAL
  Filled 2023-06-11 (×5): qty 1

## 2023-06-11 MED ORDER — CHLORDIAZEPOXIDE HCL 25 MG PO CAPS
50.0000 mg | ORAL_CAPSULE | Freq: Once | ORAL | Status: AC
  Administered 2023-06-11: 50 mg via ORAL
  Filled 2023-06-11: qty 2

## 2023-06-11 MED ORDER — CHLORDIAZEPOXIDE HCL 25 MG PO CAPS
25.0000 mg | ORAL_CAPSULE | Freq: Four times a day (QID) | ORAL | Status: AC
  Administered 2023-06-11 (×4): 25 mg via ORAL
  Filled 2023-06-11 (×4): qty 1

## 2023-06-11 MED ORDER — ONDANSETRON HCL 4 MG PO TABS
4.0000 mg | ORAL_TABLET | Freq: Four times a day (QID) | ORAL | Status: DC | PRN
  Filled 2023-06-11: qty 1

## 2023-06-11 MED ORDER — LORAZEPAM 1 MG PO TABS
1.0000 mg | ORAL_TABLET | ORAL | Status: DC | PRN
  Administered 2023-06-12: 3 mg via ORAL
  Administered 2023-06-12: 1 mg via ORAL
  Filled 2023-06-11: qty 1
  Filled 2023-06-11: qty 3

## 2023-06-11 MED ORDER — CHLORDIAZEPOXIDE HCL 25 MG PO CAPS
25.0000 mg | ORAL_CAPSULE | Freq: Three times a day (TID) | ORAL | Status: DC
  Administered 2023-06-12: 25 mg via ORAL
  Filled 2023-06-11 (×3): qty 1

## 2023-06-11 MED ORDER — CHLORDIAZEPOXIDE HCL 25 MG PO CAPS
25.0000 mg | ORAL_CAPSULE | Freq: Four times a day (QID) | ORAL | Status: DC | PRN
  Administered 2023-06-11: 25 mg via ORAL
  Filled 2023-06-11: qty 1

## 2023-06-11 MED ORDER — SODIUM CHLORIDE 0.9 % IV SOLN: INTRAVENOUS | Status: DC

## 2023-06-11 MED ORDER — IBUPROFEN 200 MG PO TABS
400.0000 mg | ORAL_TABLET | Freq: Four times a day (QID) | ORAL | Status: DC | PRN
  Administered 2023-06-11 (×2): 400 mg via ORAL
  Filled 2023-06-11 (×2): qty 2

## 2023-06-11 MED ORDER — CHLORHEXIDINE GLUCONATE CLOTH 2 % EX PADS
6.0000 | MEDICATED_PAD | Freq: Every day | CUTANEOUS | Status: DC
  Administered 2023-06-11 – 2023-06-14 (×3): 6 via TOPICAL

## 2023-06-11 MED ORDER — LORAZEPAM 1 MG PO TABS: 1.0000 mg | ORAL_TABLET | ORAL | Status: DC | PRN

## 2023-06-11 MED ORDER — LORAZEPAM 1 MG PO TABS
1.0000 mg | ORAL_TABLET | ORAL | Status: DC | PRN
  Administered 2023-06-11: 2 mg via ORAL
  Administered 2023-06-11: 4 mg via ORAL
  Filled 2023-06-11: qty 4
  Filled 2023-06-11: qty 2

## 2023-06-11 MED ORDER — DEXTROSE 50 % IV SOLN
1.0000 | Freq: Once | INTRAVENOUS | Status: AC
  Administered 2023-06-11: 50 mL via INTRAVENOUS
  Filled 2023-06-11: qty 50

## 2023-06-11 MED ORDER — SODIUM CHLORIDE 0.9 % IV BOLUS
1000.0000 mL | Freq: Once | INTRAVENOUS | Status: AC
  Administered 2023-06-11: 1000 mL via INTRAVENOUS

## 2023-06-11 MED ORDER — LOPERAMIDE HCL 2 MG PO CAPS: 2.0000 mg | ORAL_CAPSULE | ORAL | Status: DC | PRN

## 2023-06-11 MED ORDER — CHLORDIAZEPOXIDE HCL 25 MG PO CAPS: 25.0000 mg | ORAL_CAPSULE | Freq: Every day | ORAL | Status: DC

## 2023-06-11 MED ORDER — LORAZEPAM 2 MG/ML IJ SOLN
1.0000 mg | INTRAMUSCULAR | Status: DC | PRN
  Administered 2023-06-12 (×3): 3 mg via INTRAVENOUS
  Filled 2023-06-11 (×4): qty 2

## 2023-06-11 MED ORDER — ENOXAPARIN SODIUM 40 MG/0.4ML IJ SOSY
40.0000 mg | PREFILLED_SYRINGE | INTRAMUSCULAR | Status: DC
  Administered 2023-06-11: 40 mg via SUBCUTANEOUS
  Filled 2023-06-11 (×3): qty 0.4

## 2023-06-11 MED ORDER — OXYCODONE HCL 5 MG PO TABS
5.0000 mg | ORAL_TABLET | ORAL | Status: DC | PRN
  Administered 2023-06-11 – 2023-06-14 (×5): 5 mg via ORAL
  Filled 2023-06-11 (×6): qty 1

## 2023-06-11 NOTE — H&P (Addendum)
History and Physical  Gary [name] WJX:[id number] DOB: April 12, 1986 DOA: 06/11/2023  PCP: Patient, No Pcp Per   Chief Complaint: "I thought I had alcohol poisoning"  HPI: Gary [name] is a 37 y.o. male with medical history significant for HIV, anxiety, bipolar 2 disorder, schizophrenia, seizures, recurrent alcohol abuse with multiple hospital admissions due to alcohol withdrawal seizures as well as alcohol induced seizure being admitted to the hospital with alcohol intoxication and concern for impending withdrawal and seizure.  He denies any recent fevers, chills, vomiting.  Apparently he told triage nurse that he drank 2 bottles of vodka yesterday, but on my interview he tells me he "did not drink much yesterday."  Tells me he has not taken any of his medications including Biktarvy or Keppra for about a week.  Currently states that he has some low back pain, and still feels shaky, but denies any abdominal pain, or nausea.  ED Course: On evaluation in the emergency department, he is afebrile, heart rate about 109, blood pressure stable and saturating well on room air.  Lab work was completed, CBC is unremarkable, CMP notable for baseline creatinine, lipase 112, AST 215, ALT 90, initial glucose was only 54, alcohol level 317.  The emergency department he was initially given IV Ativan, and also started on scheduled Librium taper.  Review of Systems: Please see HPI for pertinent positives and negatives. A complete 10 system review of systems are otherwise negative.  Past Medical History:  Diagnosis Date   Alcohol abuse    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   Anxiety    Bipolar 2 disorder (HCC)    HIV (human immunodeficiency virus infection) (HCC)    Pancreatitis    PTSD (post-traumatic stress disorder)    Schizophrenia (HCC)    Seizures (HCC)    Subdural hematoma (HCC)    Past Surgical History:  Procedure Laterality Date   BIOPSY  04/19/2022   Procedure: BIOPSY;  Surgeon:  Lemar Lofty., MD;  Location: [facility] ENDOSCOPY;  Service: Gastroenterology;;   ENTEROSCOPY N/A 04/19/2022   Procedure: ENTEROSCOPY;  Surgeon: Lemar Lofty., MD;  Location: [facility] ENDOSCOPY;  Service: Gastroenterology;  Laterality: N/A;   INCISION AND DRAINAGE PERIRECTAL ABSCESS N/A 09/24/2016   Procedure: IRRIGATION AND DEBRIDEMENT PERIRECTAL ABSCESS;  Surgeon: Ricarda Frame, MD;  Location: [facility] ORS;  Service: General;  Laterality: N/A;   none      Social History:  reports that he has been smoking cigarettes. He has never used smokeless tobacco. He reports current alcohol use of about 105.0 standard drinks of alcohol per week. He reports current drug use. Drug: Methamphetamines.   Allergies  Allergen Reactions   Tegretol [Carbamazepine] Other (See Comments)    Caused vertigo for 2 days after taking it   Caffeine Palpitations    Family History  Problem Relation Age of Onset   Alcohol abuse Mother    Alcohol abuse Father    Colon cancer Other    Other Other    Cancer Other      Prior to Admission medications   Medication Sig Start Date End Date Taking? Authorizing Provider  bictegravir-emtricitabine-tenofovir AF (BIKTARVY) 50-200-25 MG TABS tablet Take 1 tablet by mouth daily. 04/12/23 08/10/23 Yes Vu, Tonita Phoenix, MD  levETIRAcetam (KEPPRA) 500 MG tablet Take 1 tablet (500 mg total) by mouth 2 (two) times daily. 05/21/23  Yes Jacalyn Lefevre, MD  ondansetron (ZOFRAN-ODT) 8 MG disintegrating tablet Take 1 tablet by mouth every 8 hours as needed for nausea or vomiting. Patient  not taking: Reported on 05/26/2023 05/21/23   Jacalyn Lefevre, MD  famotidine (PEPCID) 20 MG tablet Take 1 tablet (20 mg total) by mouth 2 (two) times daily. Patient not taking: Reported on 06/01/2019 05/14/19 06/02/19  Benjiman Core, MD    Physical Exam: BP 111/71   Pulse (!) 101   Temp 98.1 F (36.7 C) (Oral)   Resp 17   Ht 6' (1.829 m)   Wt 72.6 kg   SpO2 99%   BMI 21.70 kg/m   General:   Alert, oriented, mildly tremulous and anxious appearing, in no acute distress  Eyes: EOMI, clear conjuctivae, white sclerea Neck: supple, no masses, trachea mildline  Cardiovascular: RRR, no murmurs or rubs, no peripheral edema  Respiratory: clear to auscultation bilaterally, no wheezes, no crackles  Abdomen: soft, mildly tender diffusely, no guarding or rebound tenderness, nondistended, normal bowel tones heard  Skin: dry, no rashes  Musculoskeletal: no joint effusions, normal range of motion, right toe and right first metatarsal area is bruised, tender to palpation Psychiatric: appropriate affect, normal speech  Neurologic: extraocular muscles intact, clear speech, moving all extremities with intact sensorium          Labs on Admission:  Basic Metabolic Panel: Recent Labs  Lab 06/11/23 0156  NA 142  K 4.7  CL 105  CO2 14*  GLUCOSE 61*  BUN 12  CREATININE 0.94  CALCIUM 8.5*   Liver Function Tests: Recent Labs  Lab 06/11/23 0156  AST 215*  ALT 90*  ALKPHOS 93  BILITOT 0.9  PROT 8.1  ALBUMIN 4.4   Recent Labs  Lab 06/11/23 0156  LIPASE 112*   No results for input(s): "AMMONIA" in the last 168 hours. CBC: Recent Labs  Lab 06/11/23 0156  WBC 5.2  HGB 14.0  HCT 43.6  MCV 100.9*  PLT 251   Cardiac Enzymes: No results for input(s): "CKTOTAL", "CKMB", "CKMBINDEX", "TROPONINI" in the last 168 hours.  BNP (last 3 results) No results for input(s): "BNP" in the last 8760 hours.  ProBNP (last 3 results) No results for input(s): "PROBNP" in the last 8760 hours.  CBG: Recent Labs  Lab 06/11/23 0047 06/11/23 0154 06/11/23 0553  GLUCAP 54* 55* 143*    Radiological Exams on Admission: No results found.  Assessment/Plan Gary [name] is a 37 y.o. male with medical history significant for HIV, anxiety, bipolar 2 disorder, schizophrenia, seizures, recurrent alcohol abuse with multiple hospital admissions due to alcohol withdrawal seizures as well as alcohol  induced seizure being admitted to the hospital with alcohol intoxication and concern for impending withdrawal and seizure.  Alcohol intoxication-recently drank 2 bottles of vodka on 8/1, alcohol level 317 on arrival, last month he was admitted with alcohol induced seizure.  Admitted for concern of alcohol induced seizure, or eventually alcohol withdrawal seizure. -Inpatient admission to stepdown -thiamine, folate, multivitamin -Librium taper, with Librium as needed for CIWA greater than 10  History of seizure disorder-continue home Keppra 500 mg p.o. twice daily  HIV-will hold Biktarvy for now, has been noncompliant for at least the last week prior to hospital admission  Pancreatitis-I suspect mild alcohol induced pancreatitis given his abdominal tenderness, elevated lipase.  No fevers, severe abdominal pain, or other compelling reason to obtain abdominal imaging at this time. -Normal saline at 150 cc/h -Clear liquid diet -If develops severe or intractable abdominal pain, would obtain CT abdomen  Right toe injury-with bruising and tenderness to palpation, no bony deformity noted -Check x-ray of the right foot to rule  out fracture  Anion gap metabolic acidosis-likely from starvation ketosis from alcohol abuse -Continue IV fluids, trend with AM labs  Abnormal LFTs-most likely due to recent alcohol abuse, baseline liver function is normal.  Will trend with morning labs.  Hypoglycemia-due to reported no solid food intake in at least the last 24 hours, blood sugar has now normalized  History of bipolar schizophrenia-currently not treated  TOC consult placed for homelessness and substance abuse  Note patient was admitted for similar presentation last month, left AMA 7/20.   I feel he is at high risk of leaving AMA during this admission as well.  DVT prophylaxis: Lovenox     Code Status: Full Code  Consults called: None  Admission status: The appropriate patient status for this  patient is INPATIENT. Inpatient status is judged to be reasonable and necessary in order to provide the required intensity of service to ensure the patient's safety. The patient's presenting symptoms, physical exam findings, and initial radiographic and laboratory data in the context of their chronic comorbidities is felt to place them at high risk for further clinical deterioration. Furthermore, it is not anticipated that the patient will be medically stable for discharge from the hospital within 2 midnights of admission.    I certify that at the point of admission it is my clinical judgment that the patient will require inpatient hospital care spanning beyond 2 midnights from the point of admission due to high intensity of service, high risk for further deterioration and high frequency of surveillance required  Time spent: 53 minutes  Braun Rocca Sharlette Dense MD [facility] Pager [phone]  If 7PM-7AM, please contact night-coverage [email or web address] Password [facility]  06/11/2023, 7:46 AM

## 2023-06-11 NOTE — ED Triage Notes (Signed)
[place] [facility] [place] [facility]. Pt says "hey again, I think I have alcohol poisoning". When asked why he thinks that he says "because when I drink more it will not go down".   Appears to not be feeling well.   EMS cbg 58 but pt declined oral glucose.

## 2023-06-11 NOTE — ED Notes (Signed)
Patient given chicken broth. 

## 2023-06-11 NOTE — ED Provider Notes (Signed)
[place] EMERGENCY DEPARTMENT AT [facility] Provider Note   CSN: [id number] Arrival date & time: 06/11/23  0013     History  Chief Complaint  Patient presents with   Alcohol Intoxication    Gary [name] is a 37 y.o. male.  Patient presents to the emergency department via EMS complaining of possible alcohol poisoning.  He endorses drinking 2 bottles of vodka during the day yesterday with no food.  Upon arrival he was noted to be tachycardic with a heart rate of 109.  He was also noted to have a capillary glucose level of 56.  Patient currently denying abdominal pain but endorsing nausea and tremors.  Patient with past medical history for alcohol use disorder, major depressive disorder, polysubstance abuse, HIV, transaminitis, alcoholic ketoacidosis  HPI     Home Medications Prior to Admission medications   Medication Sig Start Date End Date Taking? Authorizing Provider  bictegravir-emtricitabine-tenofovir AF (BIKTARVY) 50-200-25 MG TABS tablet Take 1 tablet by mouth daily. 04/12/23 08/10/23  Vu, Tonita Phoenix, MD  levETIRAcetam (KEPPRA) 500 MG tablet Take 1 tablet (500 mg total) by mouth 2 (two) times daily. 05/21/23   Jacalyn Lefevre, MD  ondansetron (ZOFRAN-ODT) 8 MG disintegrating tablet Take 1 tablet by mouth every 8 hours as needed for nausea or vomiting. Patient not taking: Reported on 05/26/2023 05/21/23   Jacalyn Lefevre, MD  famotidine (PEPCID) 20 MG tablet Take 1 tablet (20 mg total) by mouth 2 (two) times daily. Patient not taking: Reported on 06/01/2019 05/14/19 06/02/19  Benjiman Core, MD      Allergies    Tegretol [carbamazepine] and Caffeine    Review of Systems   Review of Systems  Physical Exam Updated Vital Signs BP 124/80   Pulse (!) 106   Temp 98.7 F (37.1 C) (Oral)   Resp (!) 21   Ht 6' (1.829 m)   Wt 72.6 kg   SpO2 98%   BMI 21.70 kg/m  Physical Exam Vitals and nursing note reviewed.  Constitutional:      General: He is not in acute  distress.    Appearance: He is well-developed.  HENT:     Head: Normocephalic and atraumatic.  Eyes:     Conjunctiva/sclera: Conjunctivae normal.  Cardiovascular:     Rate and Rhythm: Normal rate and regular rhythm.     Heart sounds: No murmur heard. Pulmonary:     Effort: Pulmonary effort is normal. No respiratory distress.     Breath sounds: Normal breath sounds.  Abdominal:     Palpations: Abdomen is soft.     Tenderness: There is no abdominal tenderness.  Musculoskeletal:        General: No swelling.     Cervical back: Neck supple.  Skin:    General: Skin is warm and dry.     Capillary Refill: Capillary refill takes less than 2 seconds.  Neurological:     Mental Status: He is alert.     Comments: Patient with notable tremors in hands  Psychiatric:        Mood and Affect: Mood normal.     ED Results / Procedures / Treatments   Labs (all labs ordered are listed, but only abnormal results are displayed) Labs Reviewed  CBC - Abnormal; Notable for the following components:      Result Value   MCV 100.9 (*)    RDW 16.0 (*)    All other components within normal limits  COMPREHENSIVE METABOLIC PANEL - Abnormal; Notable for  the following components:   CO2 14 (*)    Glucose, Bld 61 (*)    Calcium 8.5 (*)    AST 215 (*)    ALT 90 (*)    Anion gap 23 (*)    All other components within normal limits  ETHANOL - Abnormal; Notable for the following components:   Alcohol, Ethyl (B) 317 (*)    All other components within normal limits  LIPASE, BLOOD - Abnormal; Notable for the following components:   Lipase 112 (*)    All other components within normal limits  CBG MONITORING, ED - Abnormal; Notable for the following components:   Glucose-Capillary 54 (*)    All other components within normal limits  CBG MONITORING, ED - Abnormal; Notable for the following components:   Glucose-Capillary 55 (*)    All other components within normal limits  RAPID URINE DRUG SCREEN, HOSP  PERFORMED    EKG None  Radiology No results found.  Procedures .Critical Care  Performed by: Darrick Grinder, PA-C Authorized by: Darrick Grinder, PA-C   Critical care provider statement:    Critical care time (minutes):  30   Critical care was time spent personally by me on the following activities:  Development of treatment plan with patient or surrogate, discussions with consultants, evaluation of patient's response to treatment, examination of patient, ordering and review of laboratory studies, ordering and review of radiographic studies, ordering and performing treatments and interventions, pulse oximetry, re-evaluation of patient's condition and review of old charts   Care discussed with: admitting provider       Medications Ordered in ED Medications  thiamine (VITAMIN B1) tablet 100 mg (has no administration in time range)    Or  thiamine (VITAMIN B1) injection 100 mg (has no administration in time range)  folic acid (FOLVITE) tablet 1 mg (has no administration in time range)  multivitamin with minerals tablet 1 tablet (has no administration in time range)  lactated ringers bolus 1,000 mL (has no administration in time range)  chlordiazePOXIDE (LIBRIUM) capsule 25 mg (has no administration in time range)  hydrOXYzine (ATARAX) tablet 25 mg (has no administration in time range)  loperamide (IMODIUM) capsule 2-4 mg (has no administration in time range)  ondansetron (ZOFRAN-ODT) disintegrating tablet 4 mg (has no administration in time range)  chlordiazePOXIDE (LIBRIUM) capsule 50 mg (has no administration in time range)  chlordiazePOXIDE (LIBRIUM) capsule 25 mg (has no administration in time range)    Followed by  chlordiazePOXIDE (LIBRIUM) capsule 25 mg (has no administration in time range)    Followed by  chlordiazePOXIDE (LIBRIUM) capsule 25 mg (has no administration in time range)    Followed by  chlordiazePOXIDE (LIBRIUM) capsule 25 mg (has no administration in  time range)  sodium chloride 0.9 % bolus 1,000 mL (1,000 mLs Intravenous New Bag/Given 06/11/23 0416)  dextrose 50 % solution 50 mL (50 mLs Intravenous Given 06/11/23 0429)    ED Course/ Medical Decision Making/ A&P                                 Medical Decision Making Amount and/or Complexity of Data Reviewed Labs: ordered.  Risk OTC drugs. Prescription drug management.   This patient presents to the ED for concern of alcohol intoxication, this involves an extensive number of treatment options, and is a complaint that carries with it a high risk of complications and morbidity.  The differential diagnosis  includes alcoholic ketoacidosis, alcohol withdrawal, alcohol intoxication without complication, others   Co morbidities that complicate the patient evaluation  HIV, polysubstance abuse   Additional history obtained:  Additional history obtained from EMS External records from outside source obtained and reviewed including H&P from July 15 and the patient was admitted due to seizure disorder exacerbated by severe EtOH disorder, recurrent acute alcohol induced pancreatitis.  Patient left AMA on July 20 to drink alcohol   Lab Tests:  I Ordered, and personally interpreted labs.  The pertinent results include: CBG 55, ethanol 317, lipase 112, CO2 14, anion gap 23, AST 215, ALT 90   Consultations Obtained:  I requested consultation with the hospitalist, Dr.Sundil and discussed lab and imaging findings as well as pertinent plan - they recommend: admission, switch ativan protocol to librium protocol   Problem List / ED Course / Critical interventions / Medication management   I ordered medication including D50 for hypoglycemia, normal saline and Ativan for alcohol intoxication/withdrawal symptoms.  Ativan discontinued and Librium initiated at suggestion of hospitalist. Reevaluation of the patient after these medicines showed that the patient improved I have reviewed the patients  home medicines and have made adjustments as needed   Social Determinants of Health:  Patient is currently homeless   Test / Admission - Considered:  Patient with alcohol ketoacidosis, needs admission for continued IV hydration, withdrawal precautions, further evaluation.         Final Clinical Impression(s) / ED Diagnoses Final diagnoses:  Alcoholic ketoacidosis    Rx / DC Orders ED Discharge Orders     None         Pamala Duffel 06/11/23 1610    Zadie Rhine, MD 06/11/23 0600

## 2023-06-11 NOTE — ED Notes (Signed)
ED TO INPATIENT HANDOFF REPORT  Name/Age/Gender Gary [name] 37 y.o. male  Code Status    Code Status Orders  (From admission, onward)           Start     Ordered   06/11/23 0742  Full code  Continuous       Question:  By:  Answer:  Consent: discussion documented in EHR   06/11/23 0742           Code Status History     Date Active Date Inactive Code Status Order ID Comments User Context   05/24/2023 1817 05/29/2023 2224 Full Code [id number]  Lurline Del, MD ED   12/30/2022 2215 12/31/2022 1512 Full Code [id number]  Darylene Price ED   12/23/2022 0422 12/26/2022 1044 Full Code [id number]  Tim Lair, PA-C ED   12/14/2022 1532 12/22/2022 1941 Full Code [id number]  Teddy Spike, DO ED   11/07/2022 2321 11/08/2022 1522 Full Code [id number]  [name], PA-C ED   10/16/2022 2251 10/23/2022 1712 Full Code [id number]  Charlsie Quest, MD ED   10/16/2022 1707 10/16/2022 1800 Full Code [id number]  Jackelyn Poling, NP Inpatient   09/11/2022 2015 09/13/2022 1644 Full Code [id number]  Belva Agee, MD ED   06/18/2022 2328 06/19/2022 2240 Full Code [id number]  Gilles Chiquito, MD ED   06/11/2022 2314 06/12/2022 1121 Full Code [id number]  Shon Baton, MD ED   05/28/2022 0346 06/01/2022 1913 Full Code [id number]  Carollee Herter, DO ED   05/20/2022 2019 05/21/2022 1727 Full Code [id number]  Ernie Avena, MD ED   04/25/2022 2012 05/03/2022 1757 Full Code [id number]  Darlin Drop, DO ED   04/16/2022 1415 04/23/2022 1911 Full Code [id number]  Clydie Braun, MD ED   04/16/2022 1033 04/16/2022 1415 Full Code [id number]  Jeannie Fend, PA-C ED   01/20/2022 0849 01/27/2022 1949 Full Code [id number]  Clydie Braun, MD ED   01/20/2022 0440 01/20/2022 0849 Full Code [id number]  Carroll Sage, PA-C ED   12/31/2021 0738 01/06/2022 2055 Full Code [id number]  Bobette Mo, MD ED   12/31/2021 0736 12/31/2021 0738 Full Code [id number]  Bobette Mo, MD ED   12/31/2021 0053 12/31/2021 0735  Full Code [id number]  Shon Baton, MD ED   11/06/2021 1540 11/08/2021 2241 Full Code [id number]  Janeann Forehand D, NP ED   11/06/2021 0200 11/06/2021 1540 Full Code [id number]  Paula Libra, MD ED   09/23/2021 0025 09/23/2021 1818 Full Code [id number]  Alvira Monday, MD ED   09/05/2021 2238 09/06/2021 1111 Full Code [id number]  Gerhard Munch, MD ED   09/05/2021 1507 09/05/2021 2238 Full Code [id number]  Samson Frederic, Cortni Kathie Rhodes, PA-C ED   08/26/2021 1556 08/30/2021 1846 Full Code [id number]  Estella Husk, MD ED   08/25/2021 2252 08/26/2021 1545 Full Code [id number]  Elpidio Anis, PA-C ED   07/26/2021 0108 07/31/2021 1948 Full Code [id number]  Loletta Parish, NP Inpatient   07/24/2021 2309 07/26/2021 0045 Full Code [id number]  Henderly, Britni A, PA-C ED   07/11/2021 2129 07/13/2021 1757 Full Code [id number]  Linwood Dibbles, MD ED   07/08/2021 0519 07/08/2021 1134 Full Code [id number]  Shon Baton, MD ED   06/28/2021 2319 06/29/2021 0621 Full Code [id number]  Maia Plan, MD ED   04/30/2021 0905 05/01/2021 1150 Full Code [id number]  Teddy Spike, DO Inpatient   04/23/2021 1201 04/23/2021 2331 Full Code [id number]  Jeannie Fend, PA-C  ED   04/07/2021 2151 04/08/2021 1516 Full Code [id number]  Jackelyn Poling, NP ED   04/06/2021 2256 04/07/2021 1108 Full Code [id number]  Linwood Dibbles, MD ED   03/11/2021 1520 03/19/2021 1925 Full Code [id number]  Eliezer Bottom, MD ED   03/08/2021 0701 03/09/2021 1757 Full Code [id number]  Cherly Anderson, PA-C ED   03/06/2021 2129 03/07/2021 0952 Full Code [id number]  Sponseller, Eugene Gavia, PA-C ED   02/24/2021 0116 02/24/2021 1107 Full Code [id number]  Petrucelli, Pleas Koch, PA-C ED   02/13/2021 0041 02/13/2021 1245 Full Code [id number]  Milagros Loll, MD ED   02/11/2021 1916 02/12/2021 1555 Full Code [id number]  Samson Frederic, Cortni S, PA-C ED   01/11/2021 2100 01/12/2021 1150 Full Code [id number]  Gerhard Munch, MD ED   12/30/2020 1949 12/31/2020 1257 Full Code [id number]  Chesley Noon, MD ED   12/29/2020 2000 12/30/2020 1235 Full Code [id number]  Sharman Cheek, MD ED   12/09/2020 0420 12/09/2020 1534 Full Code [id number]  Eduard Clos, MD ED   12/09/2020 0131 12/09/2020 0420 Full Code [id number]  Roxy Horseman, PA-C ED   12/05/2020 1448 12/08/2020 1301 Full Code [id number]  Leeroy Bock, DO ED   12/05/2020 1200 12/05/2020 1448 Full Code [id number]  Horton, Clabe Seal, DO ED   10/20/2020 0016 11/09/2020 1546 Full Code [id number]  Irean Hong, MD ED   10/19/2020 0345 10/19/2020 2211 Full Code [id number]  Andris Baumann, MD ED   10/18/2020 2122 10/19/2020 0345 Full Code [id number]  Merwyn Katos, MD ED   10/07/2020 1127 10/08/2020 2023 Full Code [id number]  Chesley Noon, MD ED   09/28/2020 0324 09/29/2020 1457 Full Code [id number]  Shaune Pollack, MD ED   09/26/2020 0950 09/27/2020 1521 Full Code [id number]  Minna Antis, MD ED   09/26/2020 0728 09/26/2020 0950 Full Code [id number]  Nita Sickle, MD ED   07/04/2020 1040 07/05/2020 2139 Full Code [id number]  Jerald Kief, MD ED   06/23/2020 2240 06/25/2020 1923 Full Code [id number]  Jeannie Fend, PA-C ED   06/14/2020 0003 06/18/2020 1749 Full Code [id number]  Venora Maples, MD ED   06/09/2020 1108 06/09/2020 2025 Full Code [id number]  Lanae Boast, MD ED   06/08/2020 1548 06/08/2020 1942 Full Code [id number]  Jackelyn Poling, NP Inpatient   06/07/2020 1758 06/08/2020 1543 Full Code [id number]  Clarene Duke, Ambrose Finland, MD ED   05/05/2020 0254 05/09/2020 1715 Full Code [id number]  Hillary Bow, DO ED   04/17/2020 2319 04/19/2020 0306 Full Code [id number]  Charlsie Quest, MD ED   04/17/2020 1915 04/17/2020 2319 Full Code [id number]  Henderly, Britni A, PA-C ED   07/25/2019 1008 07/30/2019 2058 Full Code [id number]  Clydie Braun, MD ED   07/02/2019 0905 07/03/2019 1445 Full Code [id number]  Clydie Braun, MD ED   05/12/2019 1839 05/13/2019 1500 Full Code [id number]  Fayrene Helper, PA-C ED   04/16/2019 1809 04/17/2019 1841 Full Code  [id number]  Charm Rings, NP Inpatient   04/16/2019 1238 04/16/2019 1710 Full Code [id number]  Jeanmarie Plant, MD ED   03/31/2019 0028 03/31/2019 1449 Full Code [id number]  Arnaldo Natal, MD ED   06/15/2018 2015 06/19/2018 1742 Full Code [id number]  Enid Baas, MD Inpatient   09/24/2016 0429 09/25/2016 1406 Full Code [id number]  Arnaldo Natal Inpatient       Home/SNF/Other Home  Chief Complaint Alcohol withdrawal (HCC) [F10.939]  Level of Care/Admitting Diagnosis ED Disposition  ED Disposition  Admit   Condition  --   Comment  Hospital Area: [facility] [[id number]]  Level of Care: Stepdown [14]  Admit to SDU based on following criteria: Severe physiological/psychological symptoms:  Any diagnosis requiring assessment & intervention at least every 4 hours on an ongoing basis to obtain desired patient outcomes including stability and rehabilitation  May admit patient to [name] or [facility] if equivalent level of care is available:: Yes  Covid Evaluation: Asymptomatic - no recent exposure (last 10 days) testing not required  Diagnosis: Alcohol withdrawal (HCC) [291.81.ICD-9-CM]  Admitting Physician: Maryln Gottron [[id number]]  Attending Physician: Kirby Crigler, MIR [email or web address] [[id number]]  Certification:: I certify this patient will need inpatient services for at least 2 midnights          Medical History Past Medical History:  Diagnosis Date   Alcohol abuse    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   Anxiety    Bipolar 2 disorder (HCC)    HIV (human immunodeficiency virus infection) (HCC)    Pancreatitis    PTSD (post-traumatic stress disorder)    Schizophrenia (HCC)    Seizures (HCC)    Subdural hematoma (HCC)     Allergies Allergies  Allergen Reactions   Tegretol [Carbamazepine] Other (See Comments)    Caused vertigo for 2 days after taking it   Caffeine Palpitations    IV Location/Drains/Wounds Patient Lines/Drains/Airways Status      Active Line/Drains/Airways     Name Placement date Placement time Site Days   Peripheral IV 06/11/23 20 G Anterior;Left;Upper Arm 06/11/23  0410  Arm  less than 1            Labs/Imaging Results for orders placed or performed during the hospital encounter of 06/11/23 (from the past 48 hour(s))  POC CBG, ED     Status: Abnormal   Collection Time: 06/11/23 12:47 AM  Result Value Ref Range   Glucose-Capillary 54 (L) 70 - 99 mg/dL    Comment: Glucose reference range applies only to samples taken after fasting for at least 8 hours.  CBG monitoring, ED     Status: Abnormal   Collection Time: 06/11/23  1:54 AM  Result Value Ref Range   Glucose-Capillary 55 (L) 70 - 99 mg/dL    Comment: Glucose reference range applies only to samples taken after fasting for at least 8 hours.  CBC     Status: Abnormal   Collection Time: 06/11/23  1:56 AM  Result Value Ref Range   WBC 5.2 4.0 - 10.5 K/uL   RBC 4.32 4.22 - 5.81 MIL/uL   Hemoglobin 14.0 13.0 - 17.0 g/dL   HCT 62.1 30.8 - 65.7 %   MCV 100.9 (H) 80.0 - 100.0 fL   MCH 32.4 26.0 - 34.0 pg   MCHC 32.1 30.0 - 36.0 g/dL   RDW 84.6 (H) 96.2 - 95.2 %   Platelets 251 150 - 400 K/uL   nRBC 0.0 0.0 - 0.2 %    Comment: Performed at [facility], [address], [place], [place] [address]  Comprehensive metabolic panel     Status: Abnormal   Collection Time: 06/11/23  1:56 AM  Result Value Ref Range   Sodium 142 135 - 145 mmol/L   Potassium 4.7 3.5 - 5.1 mmol/L   Chloride 105 98 - 111 mmol/L   CO2 14 (L) 22 - 32 mmol/L   Glucose, Bld 61 (L) 70 - 99 mg/dL    Comment: Glucose reference range  applies only to samples taken after fasting for at least 8 hours.   BUN 12 6 - 20 mg/dL   Creatinine, Ser 1.61 0.61 - 1.24 mg/dL   Calcium 8.5 (L) 8.9 - 10.3 mg/dL   Total Protein 8.1 6.5 - 8.1 g/dL   Albumin 4.4 3.5 - 5.0 g/dL   AST 096 (H) 15 - 41 U/L   ALT 90 (H) 0 - 44 U/L   Alkaline Phosphatase 93 38 - 126 U/L   Total Bilirubin  0.9 0.3 - 1.2 mg/dL   GFR, Estimated >04 >54 mL/min    Comment: (NOTE) Calculated using the CKD-EPI Creatinine Equation (2021)    Anion gap 23 (H) 5 - 15    Comment: ELECTROLYTES REPEATED TO VERIFY Performed at [facility], [address], [place], [place] [address]   Ethanol     Status: Abnormal   Collection Time: 06/11/23  1:56 AM  Result Value Ref Range   Alcohol, Ethyl (B) 317 (HH) <10 mg/dL    Comment: CRITICAL RESULT CALLED TO, READ BACK BY AND VERIFIED WITH LYNCH,J RN @ [phone] 06/11/23 BY CHILDRESS,E (NOTE) Lowest detectable limit for serum alcohol is 10 mg/dL.  For medical purposes only. Performed at [facility], [address], [place], [place] [address]   Lipase, blood     Status: Abnormal   Collection Time: 06/11/23  1:56 AM  Result Value Ref Range   Lipase 112 (H) 11 - 51 U/L    Comment: Performed at [facility], [address], [place], [place] [address]  Rapid urine drug screen (hospital performed)     Status: Abnormal   Collection Time: 06/11/23  5:52 AM  Result Value Ref Range   Opiates NONE DETECTED NONE DETECTED   Cocaine NONE DETECTED NONE DETECTED   Benzodiazepines POSITIVE (A) NONE DETECTED   Amphetamines NONE DETECTED NONE DETECTED   Tetrahydrocannabinol NONE DETECTED NONE DETECTED   Barbiturates NONE DETECTED NONE DETECTED    Comment: (NOTE) DRUG SCREEN FOR MEDICAL PURPOSES ONLY.  IF CONFIRMATION IS NEEDED FOR ANY PURPOSE, NOTIFY LAB WITHIN 5 DAYS.  LOWEST DETECTABLE LIMITS FOR URINE DRUG SCREEN Drug Class                     Cutoff (ng/mL) Amphetamine and metabolites    1000 Barbiturate and metabolites    200 Benzodiazepine                 200 Opiates and metabolites        300 Cocaine and metabolites        300 THC                            50 Performed at [facility], [address], [place], [place] [address]   CBG monitoring, ED     Status: Abnormal    Collection Time: 06/11/23  5:53 AM  Result Value Ref Range   Glucose-Capillary 143 (H) 70 - 99 mg/dL    Comment: Glucose reference range applies only to samples taken after fasting for at least 8 hours.  Volatiles,Blood (acetone,ethanol,isoprop,methanol)     Status: Abnormal   Collection Time: 06/11/23  6:45 AM  Result Value Ref Range   Acetone, blood <0.010 0.000 - 0.010 g/dL    Comment: (NOTE)  Detection Limit = 0.010 Performed At: [facility] [address] [place], [place] [id number] Jolene Schimke MD UX:[id number]    Ethanol, blood 0.121 (H) 0.000 - 0.010 g/dL    Comment: (NOTE) **Verified by repeat analysis** called to Jolande Mecial T 10:05 am on 08 02 24 called by Leafy Half on 08 02 24                                Detection Limit = 0.010 This test was developed and its performance characteristics determined by [facility]. It has not been cleared or approved by the Food and Drug Administration.    Isopropanol, blood <0.010 0.000 - 0.010 g/dL    Comment:                                 Detection Limit = 0.010   Methanol, blood <0.010 0.000 - 0.010 g/dL    Comment:                                 Detection Limit = 0.010   DG Foot 2 Views Right  Result Date: 06/11/2023 CLINICAL DATA:  First digit pain. EXAM: RIGHT FOOT - 2 VIEW COMPARISON:  Foot radiographs 03/11/2021 FINDINGS: There is no acute fracture or dislocation. Bony alignment is normal. There is no erosive change. There is minimal Achilles enthesopathy and inferior calcaneal spurring. The soft tissues are unremarkable. IMPRESSION: No acute finding in the foot. Electronically Signed   By: Lesia Hausen M.D.   On: 06/11/2023 08:19    Pending Labs Unresulted Labs (From admission, onward)     Start     Ordered   06/12/23 0500  Comprehensive metabolic panel  Tomorrow morning,   R        06/11/23 0742   06/12/23 0500  CBC  Tomorrow morning,   R        06/11/23 0742             Vitals/Pain Today's Vitals   06/11/23 1112 06/11/23 1312 06/11/23 1400 06/11/23 1408  BP:  113/76 116/75   Pulse:  81 90   Resp:  19 (!) 22   Temp: 98.1 F (36.7 C)   98.2 F (36.8 C)  TempSrc: Oral   Oral  SpO2:  98% 97%   Weight:      Height:      PainSc:        Isolation Precautions No active isolations  Medications Medications  thiamine (VITAMIN B1) tablet 100 mg (100 mg Oral Given 06/11/23 0941)    Or  thiamine (VITAMIN B1) injection 100 mg ( Intravenous See Alternative 06/11/23 0941)  folic acid (FOLVITE) tablet 1 mg (1 mg Oral Given 06/11/23 0941)  multivitamin with minerals tablet 1 tablet (1 tablet Oral Given 06/11/23 0941)  chlordiazePOXIDE (LIBRIUM) capsule 25 mg (25 mg Oral Given 06/11/23 1436)  hydrOXYzine (ATARAX) tablet 25 mg (25 mg Oral Given 06/11/23 1422)  loperamide (IMODIUM) capsule 2-4 mg (has no administration in time range)  chlordiazePOXIDE (LIBRIUM) capsule 25 mg (25 mg Oral Given 06/11/23 1420)    Followed by  chlordiazePOXIDE (LIBRIUM) capsule 25 mg (has no administration in time range)    Followed by  chlordiazePOXIDE (LIBRIUM) capsule 25 mg (has no administration in time range)    Followed by  chlordiazePOXIDE (LIBRIUM) capsule 25 mg (has no administration in time range)  levETIRAcetam (KEPPRA) tablet 500 mg (500 mg Oral Given 06/11/23 0846)  enoxaparin (LOVENOX) injection 40 mg (has no administration in time range)  0.9 %  sodium chloride infusion ( Intravenous New Bag/Given 06/11/23 0845)  ibuprofen (ADVIL) tablet 400 mg (400 mg Oral Given 06/11/23 0846)  traZODone (DESYREL) tablet 25 mg (has no administration in time range)  senna-docusate (Senokot-S) tablet 1 tablet (has no administration in time range)  ondansetron (ZOFRAN) tablet 4 mg ( Oral See Alternative 06/11/23 1422)    Or  ondansetron (ZOFRAN) injection 4 mg (4 mg Intravenous Given 06/11/23 1422)  albuterol (PROVENTIL) (2.5 MG/3ML) 0.083% nebulizer solution 2.5 mg (has no administration in time  range)  sodium chloride 0.9 % bolus 1,000 mL (0 mLs Intravenous Stopped 06/11/23 0520)  dextrose 50 % solution 50 mL (50 mLs Intravenous Given 06/11/23 0429)  lactated ringers bolus 1,000 mL (0 mLs Intravenous Stopped 06/11/23 0846)  chlordiazePOXIDE (LIBRIUM) capsule 50 mg (50 mg Oral Given 06/11/23 0648)    Mobility walks with person assist   A&Ox4. Last CIWA 19, MD is aware.

## 2023-06-11 NOTE — ED Notes (Signed)
CIWA 19, MD made aware. No new orders at this time.

## 2023-06-12 DIAGNOSIS — F10931 Alcohol use, unspecified with withdrawal delirium: Secondary | ICD-10-CM | POA: Diagnosis not present

## 2023-06-12 LAB — GLUCOSE, CAPILLARY
Glucose-Capillary: 75 mg/dL (ref 70–99)
Glucose-Capillary: 119 mg/dL — ABNORMAL HIGH (ref 70–99)
Glucose-Capillary: 135 mg/dL — ABNORMAL HIGH (ref 70–99)
Glucose-Capillary: 138 mg/dL — ABNORMAL HIGH (ref 70–99)

## 2023-06-12 MED ORDER — LORAZEPAM 2 MG/ML IJ SOLN
1.0000 mg | INTRAMUSCULAR | Status: DC | PRN
  Administered 2023-06-12 (×2): 3 mg via INTRAVENOUS
  Administered 2023-06-12: 4 mg via INTRAVENOUS
  Administered 2023-06-13: 2 mg via INTRAVENOUS
  Administered 2023-06-13: 1 mg via INTRAVENOUS
  Administered 2023-06-13: 2 mg via INTRAVENOUS
  Administered 2023-06-13 (×2): 3 mg via INTRAVENOUS
  Administered 2023-06-13 (×2): 1 mg via INTRAVENOUS
  Administered 2023-06-13: 3 mg via INTRAVENOUS
  Filled 2023-06-12 (×2): qty 2
  Filled 2023-06-12: qty 1
  Filled 2023-06-12: qty 2
  Filled 2023-06-12: qty 1
  Filled 2023-06-12: qty 2
  Filled 2023-06-12 (×2): qty 1
  Filled 2023-06-12 (×2): qty 2
  Filled 2023-06-12: qty 1

## 2023-06-12 MED ORDER — PROCHLORPERAZINE EDISYLATE 10 MG/2ML IJ SOLN: 10.0000 mg | Freq: Once | INTRAMUSCULAR | Status: DC

## 2023-06-12 MED ORDER — LORAZEPAM 1 MG PO TABS
1.0000 mg | ORAL_TABLET | ORAL | Status: DC | PRN
  Administered 2023-06-13: 4 mg via ORAL
  Filled 2023-06-12: qty 4

## 2023-06-12 NOTE — Progress Notes (Signed)
Pt refused evening dose of Librium and Lovenox. Pt stated "these medications don't treat seizures or withdrawal, so I am not taking them". Pt educated on use for both medications and refusal of them. Provider, Manuela Schwartz notified on patient refusal

## 2023-06-12 NOTE — Plan of Care (Signed)
  Problem: Health Behavior/Discharge Planning: Goal: Ability to manage health-related needs will improve Outcome: Progressing   Problem: Nutrition: Goal: Adequate nutrition will be maintained Outcome: Progressing   Problem: Activity: Goal: Risk for activity intolerance will decrease Outcome: Not Progressing

## 2023-06-12 NOTE — Progress Notes (Signed)
PROGRESS NOTE    Gary [name]  JXB:[id number] DOB: 29-Sep-1986 DOA: 06/11/2023 PCP: Patient, No Pcp Per    Brief Narrative:  37 year old with alcoholism, HIV untreated, schizophrenia, seizure disorder with multiple hospitalizations, homelessness presents to the emergency room stating that he might have alcohol poisoning.  He was also not taking his antiviral medications and Keppra for about a week.  Complains of being shaky, low back pain.  In the emergency room afebrile.  Tachycardic with heart rate 109.  Blood alcohol level 317.  Admitted for alcohol intoxication, withdrawal and nausea.   Assessment & Plan:   Alcohol intoxication with high risk of alcohol withdrawal and delirium tremens: Currently remains fairly stable.  On Librium taper and CIWA based benzodiazepine protocol with Ativan.  Multivitamins.  Fall precautions.  Seizure precautions. Currently not acceptable to counseling.  Will continue to counsel.  Hopefully his symptoms will stabilize on benzodiazepine, if unable to control, will need Precedex.  History of seizure disorder: Currently no evidence of new seizures.  Resume his long-term medication including Keppra 500 mg twice daily.  HIV disease: Unknown treatment.  Not taking medications.  Noncompliant.  Using [place] for 1 day in the hospital is not going to change his HIV treatment.  Abdomen pain, nausea: Suspect due to alcoholic gastritis or mild pancreatitis.  Continue clears.  If tolerates without nausea, will gradually advance diet.  Chronic liver disease with abnormal LFTs: Consistent with his chronic alcohol intake.  Compensated.  DVT prophylaxis: enoxaparin (LOVENOX) injection 40 mg Start: 06/11/23 2200 SCDs Start: 06/11/23 0741   Code Status: Full code Family Communication: None at the bedside Disposition Plan: Status is: Inpatient Remains inpatient appropriate because: Treated for active alcohol withdrawal     Consultants:  None  Procedures:   None  Antimicrobials:  None   Subjective: Patient seen and examined.  No overnight events except he had some nausea.  Patient tells me that he needs medicine for withdrawals and Librium does not work. Explained to him that he will be on scheduled Librium and as needed Ativan.  Currently without episodes of tremulousness.  He looks quite uncomfortable.  Asking for more medications.  Objective: Vitals:   06/12/23 0858 06/12/23 0900 06/12/23 1000 06/12/23 1100  BP:  119/81 (!) 106/51 111/65  Pulse:  (!) 56 64 62  Resp:  14 15 16   Temp: 98.1 F (36.7 C)     TempSrc: Oral     SpO2:  99% 96% 93%  Weight:      Height:        Intake/Output Summary (Last 24 hours) at 06/12/2023 1143 Last data filed at 06/12/2023 1000 Gross per 24 hour  Intake 4143.58 ml  Output 4825 ml  Net -681.42 ml   Filed Weights   06/11/23 0019 06/11/23 1500  Weight: 72.6 kg 69.9 kg    Examination:  General: Looks thin frail.  He is alert awake and oriented.  No focal neurological deficits. Mildly tremulous.  Not in any distress.  No flapping tremors. Cardiovascular: S1-S2 normal.  Regular rhythm. Respiratory: Bilateral clear.  No added sounds. Gastrointestinal: Soft.  Nontender.  Bowel sound present. Ext: No swelling or edema.  No cyanosis. Neuro: Anxious and tremulous.  Not in any distress.  Flat affect.     Data Reviewed: I have personally reviewed following labs and imaging studies  CBC: Recent Labs  Lab 06/11/23 0156 06/12/23 1014  WBC 5.2 2.6*  HGB 14.0 11.1*  HCT 43.6 34.3*  MCV 100.9* 100.6*  PLT 251 111*   Basic Metabolic Panel: Recent Labs  Lab 06/11/23 0156  NA 142  K 4.7  CL 105  CO2 14*  GLUCOSE 61*  BUN 12  CREATININE 0.94  CALCIUM 8.5*   GFR: Estimated Creatinine Clearance: 107.4 mL/min (by C-G formula based on SCr of 0.94 mg/dL). Liver Function Tests: Recent Labs  Lab 06/11/23 0156  AST 215*  ALT 90*  ALKPHOS 93  BILITOT 0.9  PROT 8.1  ALBUMIN 4.4    Recent Labs  Lab 06/11/23 0156  LIPASE 112*   No results for input(s): "AMMONIA" in the last 168 hours. Coagulation Profile: No results for input(s): "INR", "PROTIME" in the last 168 hours. Cardiac Enzymes: No results for input(s): "CKTOTAL", "CKMB", "CKMBINDEX", "TROPONINI" in the last 168 hours. BNP (last 3 results) No results for input(s): "PROBNP" in the last 8760 hours. HbA1C: No results for input(s): "HGBA1C" in the last 72 hours. CBG: Recent Labs  Lab 06/11/23 0047 06/11/23 0154 06/11/23 0553 06/11/23 2133 06/12/23 0749  GLUCAP 54* 55* 143* 137* 75   Lipid Profile: No results for input(s): "CHOL", "HDL", "LDLCALC", "TRIG", "CHOLHDL", "LDLDIRECT" in the last 72 hours. Thyroid Function Tests: No results for input(s): "TSH", "T4TOTAL", "FREET4", "T3FREE", "THYROIDAB" in the last 72 hours. Anemia Panel: No results for input(s): "VITAMINB12", "FOLATE", "FERRITIN", "TIBC", "IRON", "RETICCTPCT" in the last 72 hours. Sepsis Labs: No results for input(s): "PROCALCITON", "LATICACIDVEN" in the last 168 hours.  Recent Results (from the past 240 hour(s))  MRSA Next Gen by PCR, Nasal     Status: None   Collection Time: 06/11/23  3:55 PM   Specimen: Nasal Mucosa; Nasal Swab  Result Value Ref Range Status   MRSA by PCR Next Gen NOT DETECTED NOT DETECTED Final    Comment: (NOTE) The GeneXpert MRSA Assay (FDA approved for NASAL specimens only), is one component of a comprehensive MRSA colonization surveillance program. It is not intended to diagnose MRSA infection nor to guide or monitor treatment for MRSA infections. Test performance is not FDA approved in patients less than 97 years old. Performed at [facility], [address], [place], [place] [address]          Radiology Studies: DG Foot 2 Views Right  Result Date: 06/11/2023 CLINICAL DATA:  First digit pain. EXAM: RIGHT FOOT - 2 VIEW COMPARISON:  Foot radiographs 03/11/2021 FINDINGS: There is  no acute fracture or dislocation. Bony alignment is normal. There is no erosive change. There is minimal Achilles enthesopathy and inferior calcaneal spurring. The soft tissues are unremarkable. IMPRESSION: No acute finding in the foot. Electronically Signed   By: Lesia Hausen M.D.   On: 06/11/2023 08:19        Scheduled Meds:  chlordiazePOXIDE  25 mg Oral TID   Followed by   Melene Muller ON 06/13/2023] chlordiazePOXIDE  25 mg Oral BH-qamhs   Followed by   Melene Muller ON 06/14/2023] chlordiazePOXIDE  25 mg Oral Daily   Chlorhexidine Gluconate Cloth  6 each Topical Daily   enoxaparin (LOVENOX) injection  40 mg Subcutaneous Q24H   folic acid  1 mg Oral Daily   levETIRAcetam  500 mg Oral BID   multivitamin with minerals  1 tablet Oral Daily   thiamine  100 mg Oral Daily   Or   thiamine  100 mg Intravenous Daily   Continuous Infusions:  sodium chloride 150 mL/hr at 06/12/23 1000     LOS: 1 day    Time spent: 35 minutes  Dorcas Carrow, MD [facility]

## 2023-06-12 NOTE — Progress Notes (Signed)
       CROSS COVER NOTE  NAME: TRAJAN GROVE MRN: [id number] DOB : 1985/12/10    Concern as stated by nurse / staff   Patietn scoring 19 1 h after lorazepam dose and complaining of ongoing nausea.Also refusing librium Also with ongoing nausea post zofran     Pertinent findings on chart review: H &P progress and labs reviewed   Assessment and  Interventions   Assessment:  Plan: Change CIWA protocol to stepdown with q1h ativan prn     Donnie Mesa NP [facility] Cross Cover 7pm-7am - check amion for availability Pager [phone]

## 2023-06-12 NOTE — Progress Notes (Signed)
Patient refused all morning meds other than Keppra and his prn oxycodone. Stated "all those other pills are useless."

## 2023-06-13 DIAGNOSIS — F10931 Alcohol use, unspecified with withdrawal delirium: Secondary | ICD-10-CM | POA: Diagnosis not present

## 2023-06-13 LAB — GLUCOSE, CAPILLARY
Glucose-Capillary: 86 mg/dL (ref 70–99)
Glucose-Capillary: 98 mg/dL (ref 70–99)
Glucose-Capillary: 104 mg/dL — ABNORMAL HIGH (ref 70–99)

## 2023-06-13 MED ORDER — PHENOBARBITAL 32.4 MG PO TABS: 32.4000 mg | ORAL_TABLET | Freq: Three times a day (TID) | ORAL | Status: DC

## 2023-06-13 MED ORDER — PHENOBARBITAL 97.2 MG PO TABS
97.2000 mg | ORAL_TABLET | Freq: Three times a day (TID) | ORAL | Status: DC
  Administered 2023-06-14 – 2023-06-15 (×4): 97.2 mg via ORAL
  Filled 2023-06-13 (×4): qty 3

## 2023-06-13 MED ORDER — LORAZEPAM 2 MG/ML IJ SOLN
1.0000 mg | INTRAMUSCULAR | Status: DC | PRN
  Administered 2023-06-14 – 2023-06-15 (×8): 2 mg via INTRAVENOUS
  Filled 2023-06-13 (×8): qty 1

## 2023-06-13 MED ORDER — SODIUM CHLORIDE 0.9 % IV SOLN
12.5000 mg | Freq: Three times a day (TID) | INTRAVENOUS | Status: DC | PRN
  Filled 2023-06-13: qty 0.5

## 2023-06-13 MED ORDER — DEXMEDETOMIDINE HCL IN NACL 200 MCG/50ML IV SOLN
0.0000 ug/kg/h | INTRAVENOUS | Status: DC
  Administered 2023-06-13: 0.4 ug/kg/h via INTRAVENOUS
  Administered 2023-06-13 – 2023-06-14 (×3): 0.5 ug/kg/h via INTRAVENOUS
  Administered 2023-06-14: 0.4 ug/kg/h via INTRAVENOUS
  Filled 2023-06-13 (×5): qty 50

## 2023-06-13 MED ORDER — PHENOBARBITAL 64.8 MG PO TABS
64.8000 mg | ORAL_TABLET | Freq: Three times a day (TID) | ORAL | Status: DC
  Administered 2023-06-15: 64.8 mg via ORAL
  Filled 2023-06-13: qty 2

## 2023-06-13 NOTE — TOC CM/SW Note (Signed)
CSW added resources to the patient's chart.  

## 2023-06-13 NOTE — Progress Notes (Signed)
PROGRESS NOTE    Gary [name]  EXB:[id number] DOB: 08/01/1986 DOA: 06/11/2023 PCP: Patient, No Pcp Per    Brief Narrative:  37 year old with alcoholism, HIV untreated, schizophrenia, seizure disorder with multiple hospitalizations, homelessness presents to the emergency room stating that he might have alcohol poisoning.  He was also not taking his antiviral medications and Keppra for about a week.  Complains of being shaky, low back pain.  In the emergency room afebrile.  Tachycardic with heart rate 109.  Blood alcohol level 317.  Admitted for alcohol intoxication, withdrawal and nausea.   Assessment & Plan:   Alcohol intoxication with high risk of alcohol withdrawal and delirium tremens: Currently remains fairly stable.  On Librium taper that he declines.  Happy to take more doses of Ativan.  Multivitamins.  Fall precautions.  Seizure precautions. Currently not acceptable to counseling.  Will continue to counsel.  Hopefully his symptoms will stabilize on benzodiazepine, if unable to control, will need Precedex.  History of seizure disorder: Currently no evidence of new seizures.  Resumed his long-term medication including Keppra 500 mg twice daily.  HIV disease: Unknown treatment.  Not taking medications.  Noncompliant.  Using [place] for 1 day in the hospital is not going to change his HIV treatment.  Abdomen pain, nausea: Suspect due to alcoholic gastritis or mild pancreatitis.  Continue clears.  Improved.  Tolerating regular diet.  Chronic liver disease with abnormal LFTs: Consistent with his chronic alcohol intake.  Compensated.  DVT prophylaxis: enoxaparin (LOVENOX) injection 40 mg Start: 06/11/23 2200 SCDs Start: 06/11/23 0741   Code Status: Full code Family Communication: None at the bedside Disposition Plan: Status is: Inpatient Remains inpatient appropriate because: Treated for active alcohol withdrawal     Consultants:  None  Procedures:  None  Antimicrobials:   None   Subjective: Patient seen and examined.  Currently sleepy.  Keeps asking for Dilaudid and higher doses of Ativan.  He has been receiving Ativan all night for tachycardia and tremulousness.  Objective: Vitals:   06/13/23 0800 06/13/23 0816 06/13/23 0900 06/13/23 1000  BP: 119/69  (!) 92/56 103/76  Pulse: 91 72 73 75  Resp: 20  (!) 22 (!) 7  Temp:      TempSrc:      SpO2: 92%  94% 95%  Weight:      Height:        Intake/Output Summary (Last 24 hours) at 06/13/2023 1038 Last data filed at 06/13/2023 1000 Gross per 24 hour  Intake 3873.98 ml  Output 3025 ml  Net 848.98 ml   Filed Weights   06/11/23 0019 06/11/23 1500  Weight: 72.6 kg 69.9 kg    Examination:  General: Looks thin frail.  Alert awake oriented.  No hallucinations or delusions.  No focal neurological deficits. Mildly tremulous.  Not in any distress.   Cardiovascular: S1-S2 normal.  Regular rhythm. Respiratory: Bilateral clear.  No added sounds. Gastrointestinal: Soft.  Nontender.  Bowel sound present. Ext: No swelling or edema.  No cyanosis. Neuro: Anxious and tremulous.  Not in any distress.  Flat affect.     Data Reviewed: I have personally reviewed following labs and imaging studies  CBC: Recent Labs  Lab 06/11/23 0156 06/12/23 1014  WBC 5.2 2.6*  HGB 14.0 11.1*  HCT 43.6 34.3*  MCV 100.9* 100.6*  PLT 251 111*   Basic Metabolic Panel: Recent Labs  Lab 06/11/23 0156 06/12/23 1014  NA 142 134*  K 4.7 3.9  CL 105 104  CO2  14* 23  GLUCOSE 61* 145*  BUN 12 <5*  CREATININE 0.94 0.64  CALCIUM 8.5* 8.2*   GFR: Estimated Creatinine Clearance: 126.2 mL/min (by C-G formula based on SCr of 0.64 mg/dL). Liver Function Tests: Recent Labs  Lab 06/11/23 0156 06/12/23 1014  AST 215* 51*  ALT 90* 44  ALKPHOS 93 60  BILITOT 0.9 1.4*  PROT 8.1 5.4*  ALBUMIN 4.4 2.9*   Recent Labs  Lab 06/11/23 0156  LIPASE 112*   No results for input(s): "AMMONIA" in the last 168 hours. Coagulation  Profile: No results for input(s): "INR", "PROTIME" in the last 168 hours. Cardiac Enzymes: No results for input(s): "CKTOTAL", "CKMB", "CKMBINDEX", "TROPONINI" in the last 168 hours. BNP (last 3 results) No results for input(s): "PROBNP" in the last 8760 hours. HbA1C: No results for input(s): "HGBA1C" in the last 72 hours. CBG: Recent Labs  Lab 06/12/23 0749 06/12/23 1130 06/12/23 1615 06/12/23 2258 06/13/23 0752  GLUCAP 75 119* 135* 138* 86   Lipid Profile: No results for input(s): "CHOL", "HDL", "LDLCALC", "TRIG", "CHOLHDL", "LDLDIRECT" in the last 72 hours. Thyroid Function Tests: No results for input(s): "TSH", "T4TOTAL", "FREET4", "T3FREE", "THYROIDAB" in the last 72 hours. Anemia Panel: No results for input(s): "VITAMINB12", "FOLATE", "FERRITIN", "TIBC", "IRON", "RETICCTPCT" in the last 72 hours. Sepsis Labs: No results for input(s): "PROCALCITON", "LATICACIDVEN" in the last 168 hours.  Recent Results (from the past 240 hour(s))  MRSA Next Gen by PCR, Nasal     Status: None   Collection Time: 06/11/23  3:55 PM   Specimen: Nasal Mucosa; Nasal Swab  Result Value Ref Range Status   MRSA by PCR Next Gen NOT DETECTED NOT DETECTED Final    Comment: (NOTE) The GeneXpert MRSA Assay (FDA approved for NASAL specimens only), is one component of a comprehensive MRSA colonization surveillance program. It is not intended to diagnose MRSA infection nor to guide or monitor treatment for MRSA infections. Test performance is not FDA approved in patients less than 44 years old. Performed at [facility], [address] [place], [place] [address]          Radiology Studies: No results found.      Scheduled Meds:  chlordiazePOXIDE  25 mg Oral BH-qamhs   Followed by   Melene Muller ON 06/14/2023] chlordiazePOXIDE  25 mg Oral Daily   Chlorhexidine Gluconate Cloth  6 each Topical Daily   enoxaparin (LOVENOX) injection  40 mg Subcutaneous Q24H   folic acid  1 mg  Oral Daily   levETIRAcetam  500 mg Oral BID   multivitamin with minerals  1 tablet Oral Daily   thiamine  100 mg Oral Daily   Or   thiamine  100 mg Intravenous Daily   Continuous Infusions:  sodium chloride 150 mL/hr at 06/13/23 1000     LOS: 2 days    Time spent: 35 minutes     Dorcas Carrow, MD [facility]

## 2023-06-13 NOTE — Plan of Care (Signed)
  Problem: Education: Goal: Knowledge of General Education information will improve Description: Including pain rating scale, medication(s)/side effects and non-pharmacologic comfort measures Outcome: Not Progressing   Problem: Health Behavior/Discharge Planning: Goal: Ability to manage health-related needs will improve Outcome: Not Progressing   Problem: Coping: Goal: Level of anxiety will decrease Outcome: Not Progressing   Problem: Safety: Goal: Ability to remain free from injury will improve Outcome: Not Progressing   

## 2023-06-13 NOTE — Plan of Care (Signed)
  Problem: Education: Goal: Knowledge of General Education information will improve Description: Including pain rating scale, medication(s)/side effects and non-pharmacologic comfort measures Outcome: Progressing   Problem: Health Behavior/Discharge Planning: Goal: Ability to manage health-related needs will improve Outcome: Progressing   Problem: Activity: Goal: Risk for activity intolerance will decrease Outcome: Progressing   Problem: Nutrition: Goal: Adequate nutrition will be maintained Outcome: Progressing   Problem: Coping: Goal: Level of anxiety will decrease Outcome: Progressing   Problem: Elimination: Goal: Will not experience complications related to bowel motility Outcome: Progressing Goal: Will not experience complications related to urinary retention Outcome: Progressing   Problem: Safety: Goal: Ability to remain free from injury will improve Outcome: Progressing

## 2023-06-14 DIAGNOSIS — F10931 Alcohol use, unspecified with withdrawal delirium: Secondary | ICD-10-CM | POA: Diagnosis not present

## 2023-06-14 LAB — GLUCOSE, CAPILLARY
Glucose-Capillary: 97 mg/dL (ref 70–99)
Glucose-Capillary: 87 mg/dL (ref 70–99)
Glucose-Capillary: 139 mg/dL — ABNORMAL HIGH (ref 70–99)

## 2023-06-14 MED ORDER — POTASSIUM CHLORIDE CRYS ER 20 MEQ PO TBCR
40.0000 meq | EXTENDED_RELEASE_TABLET | Freq: Two times a day (BID) | ORAL | Status: DC
  Administered 2023-06-14 – 2023-06-15 (×3): 40 meq via ORAL
  Filled 2023-06-14 (×3): qty 2

## 2023-06-14 MED ORDER — POTASSIUM CHLORIDE 10 MEQ/100ML IV SOLN
10.0000 meq | Freq: Once | INTRAVENOUS | Status: AC
  Administered 2023-06-14: 10 meq via INTRAVENOUS
  Filled 2023-06-14: qty 100

## 2023-06-14 MED ORDER — MAGNESIUM SULFATE IN D5W 1-5 GM/100ML-% IV SOLN
1.0000 g | Freq: Once | INTRAVENOUS | Status: DC
  Filled 2023-06-14: qty 100

## 2023-06-14 MED ORDER — MAGNESIUM OXIDE -MG SUPPLEMENT 400 (240 MG) MG PO TABS
400.0000 mg | ORAL_TABLET | Freq: Two times a day (BID) | ORAL | Status: DC
  Administered 2023-06-14 – 2023-06-15 (×3): 400 mg via ORAL
  Filled 2023-06-14 (×3): qty 1

## 2023-06-14 MED ORDER — POTASSIUM CHLORIDE 10 MEQ/100ML IV SOLN
10.0000 meq | INTRAVENOUS | Status: DC
Start: 2023-06-14 — End: 2023-06-14

## 2023-06-14 NOTE — Progress Notes (Signed)
For this shift, the patient has refused all PO medications, has been inconstant of urine x2, and has refused his IV potassium and Magnesium, NP is aware of patients refusal of medications, patient also states he will only take IV dilaudid for his pain.

## 2023-06-14 NOTE — Plan of Care (Signed)

## 2023-06-14 NOTE — Plan of Care (Signed)
  Problem: Education: Goal: Knowledge of General Education information will improve Description: Including pain rating scale, medication(s)/side effects and non-pharmacologic comfort measures Outcome: Progressing   Problem: Health Behavior/Discharge Planning: Goal: Ability to manage health-related needs will improve Outcome: Progressing   Problem: Clinical Measurements: Goal: Ability to maintain clinical measurements within normal limits will improve Outcome: Progressing Goal: Will remain free from infection Outcome: Progressing Goal: Diagnostic test results will improve Outcome: Progressing Goal: Respiratory complications will improve Outcome: Progressing Goal: Cardiovascular complication will be avoided Outcome: Progressing   Problem: Activity: Goal: Risk for activity intolerance will decrease Outcome: Not Progressing Note: Patient refusing mobility   Problem: Nutrition: Goal: Adequate nutrition will be maintained Outcome: Progressing   Problem: Coping: Goal: Level of anxiety will decrease Outcome: Progressing   Problem: Pain Managment: Goal: General experience of comfort will improve Outcome: Progressing   Problem: Safety: Goal: Ability to remain free from injury will improve Outcome: Not Progressing   Problem: Skin Integrity: Goal: Risk for impaired skin integrity will decrease Outcome: Progressing

## 2023-06-14 NOTE — Progress Notes (Addendum)
0745 patient alert to place and self and year not month patient cursing and agitated noted tremors in hands with drinking water PRN medications given all AM medications given while patient agreeable to take them 1200 patient sleeping  1535 patient asking for ativan but does not meet criteria for it education given

## 2023-06-14 NOTE — Progress Notes (Signed)
PROGRESS NOTE    Gary [name]  ZOX:[id number] DOB: [birth year] DOA: 06/11/2023 PCP: Patient, No Pcp Per    Brief Narrative:  37 year old with alcoholism, HIV, schizophrenia, seizure disorder with multiple hospitalizations, homelessness presented to the emergency room stating that he might have alcohol poisoning.  He was also not taking his antiviral medications and Keppra for about a week after he was released from [place]. Complains of being shaky, low back pain.  In the emergency room afebrile.  Tachycardic with heart rate 109.  Blood alcohol level 317.  Admitted for alcohol intoxication, withdrawal and nausea.  Assessment & Plan:  Alcohol intoxication with alcohol withdrawal and delirium tremens: Currently with episodes of delirium tremens but overall hemodynamically stable. Declined Librium. Agreed with phenobarbital, will start on phenobarb taper. Continue CIWA based benzodiazepine protocol with Ativan as needed. Started on Precedex 8/4 due to persistent symptoms and high requirement of Ativan. Multivitamins.  Fall precautions.  Seizure precautions. Patient wants to quit drinking.  Will continue to counsel.  History of seizure disorder: Currently no evidence of new seizures.  Resumed his long-term medication including Keppra 500 mg twice daily.  Will prescribe on discharge.  HIV disease: Unknown treatment.  Does not remember CD4 count.  He does follow-up with [place] [facility].   Patient tells me he had been taking [facility] in the jail but when he was released a week ago he forgot to pick up the medications.  Will resume.    Abdomen pain, nausea: Suspect due to alcoholic gastritis or mild pancreatitis.  Currently improved.  Tolerating regular diet.  Discontinue IV fluids.   Chronic liver disease with abnormal LFTs: Consistent with his chronic alcohol intake.  Compensated.  Hypokalemia/hypomagnesemia: Replace aggressively.  Recheck levels.  DVT prophylaxis: enoxaparin (LOVENOX)  injection 40 mg Start: 06/11/23 2200 SCDs Start: 06/11/23 0741   Code Status: Full code Family Communication: None at the bedside Disposition Plan: Status is: Inpatient Remains inpatient appropriate because: Treated for active alcohol withdrawal     Consultants:  None  Procedures:  None  Antimicrobials:  None   Subjective:  Patient seen and examined.  Patient declined oral medications unless given Dilaudid and Ativan.  We discussed about not using Dilaudid and he agreed.  He is agreeable to take oral medications including phenobarbital.  He is on minimal dose of Precedex and managed with intermittent doses of Ativan.  No evidence of complications but he tends to ask for injectable Dilaudid and Ativan.  Objective: Vitals:   06/14/23 0800 06/14/23 0811 06/14/23 0900 06/14/23 1000  BP: 117/78  111/78 107/77  Pulse: 71  62 (!) 55  Resp: 17  17 18   Temp:  97.7 F (36.5 C)    TempSrc:  Oral    SpO2: 96%  96% 95%  Weight:      Height:        Intake/Output Summary (Last 24 hours) at 06/14/2023 1011 Last data filed at 06/14/2023 1000 Gross per 24 hour  Intake 3673.39 ml  Output 1600 ml  Net 2073.39 ml   Filed Weights   06/11/23 0019 06/11/23 1500  Weight: 72.6 kg 69.9 kg    Examination:  General: Looks thin frail.  Alert awake oriented.  No hallucinations or delusions.  No focal neurological deficits. Mildly anxious and tremulous.  No flapping tremors. Cardiovascular: S1-S2 normal.  Regular rhythm. Respiratory: Bilateral clear.  No added sounds. Gastrointestinal: Soft.  Nontender.  Bowel sound present. Ext: No swelling or edema.  No cyanosis. Neuro: Anxious  and tremulous.  Not in any distress.  Flat affect.     Data Reviewed: I have personally reviewed following labs and imaging studies  CBC: Recent Labs  Lab 06/11/23 0156 06/12/23 1014 06/14/23 0305  WBC 5.2 2.6* 2.9*  NEUTROABS  --   --  1.6*  HGB 14.0 11.1* 12.5*  HCT 43.6 34.3* 39.0  MCV 100.9*  100.6* 99.7  PLT 251 111* 93*   Basic Metabolic Panel: Recent Labs  Lab 06/11/23 0156 06/12/23 1014 06/14/23 0305  NA 142 134* 136  K 4.7 3.9 3.4*  CL 105 104 105  CO2 14* 23 21*  GLUCOSE 61* 145* 94  BUN 12 <5* <5*  CREATININE 0.94 0.64 0.55*  CALCIUM 8.5* 8.2* 8.7*  MG  --   --  1.6*  PHOS  --   --  4.3   GFR: Estimated Creatinine Clearance: 126.2 mL/min (A) (by C-G formula based on SCr of 0.55 mg/dL (L)). Liver Function Tests: Recent Labs  Lab 06/11/23 0156 06/12/23 1014 06/14/23 0305  AST 215* 51* 61*  ALT 90* 44 46*  ALKPHOS 93 60 60  BILITOT 0.9 1.4* 1.0  PROT 8.1 5.4* 5.7*  ALBUMIN 4.4 2.9* 2.9*   Recent Labs  Lab 06/11/23 0156  LIPASE 112*   No results for input(s): "AMMONIA" in the last 168 hours. Coagulation Profile: No results for input(s): "INR", "PROTIME" in the last 168 hours. Cardiac Enzymes: No results for input(s): "CKTOTAL", "CKMB", "CKMBINDEX", "TROPONINI" in the last 168 hours. BNP (last 3 results) No results for input(s): "PROBNP" in the last 8760 hours. HbA1C: No results for input(s): "HGBA1C" in the last 72 hours. CBG: Recent Labs  Lab 06/12/23 2258 06/13/23 0752 06/13/23 1121 06/13/23 1558 06/14/23 0801  GLUCAP 138* 86 98 104* 97   Lipid Profile: No results for input(s): "CHOL", "HDL", "LDLCALC", "TRIG", "CHOLHDL", "LDLDIRECT" in the last 72 hours. Thyroid Function Tests: No results for input(s): "TSH", "T4TOTAL", "FREET4", "T3FREE", "THYROIDAB" in the last 72 hours. Anemia Panel: No results for input(s): "VITAMINB12", "FOLATE", "FERRITIN", "TIBC", "IRON", "RETICCTPCT" in the last 72 hours. Sepsis Labs: No results for input(s): "PROCALCITON", "LATICACIDVEN" in the last 168 hours.  Recent Results (from the past 240 hour(s))  MRSA Next Gen by PCR, Nasal     Status: None   Collection Time: 06/11/23  3:55 PM   Specimen: Nasal Mucosa; Nasal Swab  Result Value Ref Range Status   MRSA by PCR Next Gen NOT DETECTED NOT DETECTED  Final    Comment: (NOTE) The GeneXpert MRSA Assay (FDA approved for NASAL specimens only), is one component of a comprehensive MRSA colonization surveillance program. It is not intended to diagnose MRSA infection nor to guide or monitor treatment for MRSA infections. Test performance is not FDA approved in patients less than 12 years old. Performed at [facility], [address] [place], [place] [address]          Radiology Studies: No results found.      Scheduled Meds:  Chlorhexidine Gluconate Cloth  6 each Topical Daily   enoxaparin (LOVENOX) injection  40 mg Subcutaneous Q24H   folic acid  1 mg Oral Daily   levETIRAcetam  500 mg Oral BID   magnesium oxide  400 mg Oral BID   multivitamin with minerals  1 tablet Oral Daily   phenobarbital  97.2 mg Oral Q8H   Followed by   [START ON 06/15/2023] phenobarbital  64.8 mg Oral Q8H   Followed by   Melene Muller  ON 06/17/2023] phenobarbital  32.4 mg Oral Q8H   potassium chloride  40 mEq Oral BID   thiamine  100 mg Oral Daily   Or   thiamine  100 mg Intravenous Daily   Continuous Infusions:  dexmedetomidine (PRECEDEX) IV infusion 0.4 mcg/kg/hr (06/14/23 1000)   magnesium sulfate bolus IVPB     promethazine (PHENERGAN) injection (IM or IVPB)       LOS: 3 days    Time spent: 50 minutes.    Dorcas Carrow, MD [facility]

## 2023-06-15 DIAGNOSIS — F10931 Alcohol use, unspecified with withdrawal delirium: Secondary | ICD-10-CM | POA: Diagnosis not present

## 2023-06-15 LAB — GLUCOSE, CAPILLARY
Glucose-Capillary: 113 mg/dL — ABNORMAL HIGH (ref 70–99)
Glucose-Capillary: 99 mg/dL (ref 70–99)

## 2023-06-15 MED ORDER — BICTEGRAVIR-EMTRICITAB-TENOFOV 50-200-25 MG PO TABS
1.0000 | ORAL_TABLET | Freq: Every day | ORAL | Status: DC
  Administered 2023-06-15: 1 via ORAL
  Filled 2023-06-15: qty 1

## 2023-06-15 NOTE — Discharge Summary (Signed)
Left AMA 37 year old male with history of HIV, followed by ID, seizure disorder on Keppra, severe EtOH abuse with history of seizures during withdrawal, frequent admissions to ED for alcohol intoxication/withdrawal, PTSD, bipolar 2, schizophrenia, anxiety, history of subdural hematoma, history of EtOH induced pancreatitis came to ED via EMS after he had a seizure episode.   Patient decided to leave AMA, he was alert oriented x 4, understood that he was not stable for discharge.  Still wanted to leave AMA.

## 2023-06-15 NOTE — Progress Notes (Signed)
Transition of Care [facility]) - Inpatient Brief Assessment   Patient Details  Name: Gary [name] MRN: [id number] Date of Birth: 08-02-1986  [facility]) CM/SW Contact:    Coralyn Helling, LCSW Phone Number: 06/15/2023, 10:20 AM   Clinical Narrative: Patient is familiar to [facility] team seen on 2/16, 2/18, 2/22, 7/12 and 7/16. Patient has refused resources on passed admissions. Resources added to patient AVS. Patient now has medicaid. Per notes he is followed by ID MD in the community. TOC can assist with bus pass at dc if needed.    Transition of Care Asessment: Insurance and Status: (P) Insurance coverage has been reviewed Patient has primary care physician: (P)  (Followed by ID MD, per notes.) Home environment has been reviewed: (P) Homeless Prior level of function:: (P) Independent Prior/Current Home Services: (P) No current home services Social Determinants of Health Reivew: (P) SDOH reviewed needs interventions (Resources added to AVS) Readmission risk has been reviewed: (P) Yes Transition of care needs: (P) no transition of care needs at this time

## 2023-06-15 NOTE — Progress Notes (Signed)
PROGRESS NOTE    LUIZ ANGELOS  WUJ:[id number] DOB: 1986/06/29 DOA: 06/11/2023 PCP: Patient, No Pcp Per    Brief Narrative:  37 year old with alcoholism, HIV, schizophrenia, seizure disorder with multiple hospitalizations, homelessness presented to the emergency room stating that he might have alcohol poisoning.  He was also not taking his antiviral medications and Keppra for about a week after he was released from [facility]. Complains of being shaky, low back pain.  In the emergency room afebrile, Tachycardic with heart rate 109.  Blood alcohol level 317.  Admitted for alcohol intoxication, withdrawal and nausea. Patient is manipulative, looks for Dilaudid and Ativan.  5 days in the hospital course and he has done well symptomatically.  Assessment & Plan:  Alcohol intoxication with alcohol withdrawal and delirium tremens: Had episodes of delirium tremens but overall hemodynamically stable. Was on Precedex, stopped 8/5. On phenobarbital taper. IV Ativan as needed.  Seizure precautions. Start mobilizing with PT OT today. Patient wants to quit drinking.  Will continue to counsel.  He declines outpatient resources. Patient tells me he is going to quit on his own.  History of seizure disorder: Currently no evidence of new seizures.  Resumed his long-term medication including Keppra 500 mg twice daily.  Will prescribe on discharge.  HIV disease: Unknown treatment.  Does not remember CD4 count.  He does follow-up with [facility].   Patient tells me he had been taking Biktarvy in the jail but when he was released a week ago he forgot to pick up the medications.  Will resume.    Abdomen pain, nausea: Suspect due to alcoholic gastritis or mild pancreatitis.  Currently improved.  Tolerating regular diet.  Discontinue IV fluids.   Chronic liver disease with abnormal LFTs: Consistent with his chronic alcohol intake.  Compensated.  Hypokalemia/hypomagnesemia: Replaced and  adequate.  Transfer out of ICU.  MedSurg bed.  Mobilize with PT OT.  If no event next 24 hours, he should be able to go home tomorrow.  DVT prophylaxis: enoxaparin (LOVENOX) injection 40 mg Start: 06/11/23 2200 SCDs Start: 06/11/23 0741   Code Status: Full code Family Communication: None at the bedside Disposition Plan: Status is: Inpatient Remains inpatient appropriate because: Treated for active alcohol withdrawal.     Consultants:  None  Procedures:  None  Antimicrobials:  None   Subjective:  Patient seen and examined.  No overnight events.  Looks fairly comfortable.  He always asks for IV Ativan and IV Dilaudid.   I told him he looks well today and we should start walking.  "You want me to walk so that you can discharge me, you are trying to get rid of me" We discussed about importance of mobility and getting him to steady state before discharge and he agreed.  Objective: Vitals:   06/15/23 0700 06/15/23 0800 06/15/23 0900 06/15/23 1000  BP: (!) 97/54 (!) 97/55 (!) 95/59 (!) 90/36  Pulse: 84 74 76 75  Resp: 10 15 20 18   Temp:  98.2 F (36.8 C)    TempSrc:  Oral    SpO2: 92% 94% 96% 96%  Weight:      Height:        Intake/Output Summary (Last 24 hours) at 06/15/2023 1046 Last data filed at 06/14/2023 1500 Gross per 24 hour  Intake 328.77 ml  Output 850 ml  Net -521.23 ml   Filed Weights   06/11/23 0019 06/11/23 1500  Weight: 72.6 kg 69.9 kg    Examination:  General: Looks  thin frail.  Comfortable.  On room air. Mildly anxious and tremulous.  No flapping tremors. Cardiovascular: S1-S2 normal.  Regular rhythm. Respiratory: Bilateral clear.  No added sounds. Gastrointestinal: Soft.  Nontender.  Bowel sound present. Ext: No swelling or edema.  No cyanosis. Neuro: Alert awake and oriented x 4.  Looks fairly comfortable.  Not in any distress.  Flat affect.     Data Reviewed: I have personally reviewed following labs and imaging studies  CBC: Recent  Labs  Lab 06/11/23 0156 06/12/23 1014 06/14/23 0305 06/15/23 0302  WBC 5.2 2.6* 2.9* 4.9  NEUTROABS  --   --  1.6* 3.1  HGB 14.0 11.1* 12.5* 12.6*  HCT 43.6 34.3* 39.0 38.4*  MCV 100.9* 100.6* 99.7 100.8*  PLT 251 111* 93* 121*   Basic Metabolic Panel: Recent Labs  Lab 06/11/23 0156 06/12/23 1014 06/14/23 0305 06/15/23 0302  NA 142 134* 136 134*  K 4.7 3.9 3.4* 3.7  CL 105 104 105 100  CO2 14* 23 21* 25  GLUCOSE 61* 145* 94 107*  BUN 12 <5* <5* 9  CREATININE 0.94 0.64 0.55* 0.61  CALCIUM 8.5* 8.2* 8.7* 9.1  MG  --   --  1.6* 1.7  PHOS  --   --  4.3 4.1   GFR: Estimated Creatinine Clearance: 126.2 mL/min (by C-G formula based on SCr of 0.61 mg/dL). Liver Function Tests: Recent Labs  Lab 06/11/23 0156 06/12/23 1014 06/14/23 0305 06/15/23 0302  AST 215* 51* 61* 47*  ALT 90* 44 46* 50*  ALKPHOS 93 60 60 65  BILITOT 0.9 1.4* 1.0 0.5  PROT 8.1 5.4* 5.7* 6.2*  ALBUMIN 4.4 2.9* 2.9* 3.1*   Recent Labs  Lab 06/11/23 0156  LIPASE 112*   No results for input(s): "AMMONIA" in the last 168 hours. Coagulation Profile: No results for input(s): "INR", "PROTIME" in the last 168 hours. Cardiac Enzymes: No results for input(s): "CKTOTAL", "CKMB", "CKMBINDEX", "TROPONINI" in the last 168 hours. BNP (last 3 results) No results for input(s): "PROBNP" in the last 8760 hours. HbA1C: No results for input(s): "HGBA1C" in the last 72 hours. CBG: Recent Labs  Lab 06/13/23 1558 06/14/23 0801 06/14/23 1150 06/14/23 1706 06/15/23 0733  GLUCAP 104* 97 87 139* 113*   Lipid Profile: No results for input(s): "CHOL", "HDL", "LDLCALC", "TRIG", "CHOLHDL", "LDLDIRECT" in the last 72 hours. Thyroid Function Tests: No results for input(s): "TSH", "T4TOTAL", "FREET4", "T3FREE", "THYROIDAB" in the last 72 hours. Anemia Panel: No results for input(s): "VITAMINB12", "FOLATE", "FERRITIN", "TIBC", "IRON", "RETICCTPCT" in the last 72 hours. Sepsis Labs: No results for input(s):  "PROCALCITON", "LATICACIDVEN" in the last 168 hours.  Recent Results (from the past 240 hour(s))  MRSA Next Gen by PCR, Nasal     Status: None   Collection Time: 06/11/23  3:55 PM   Specimen: Nasal Mucosa; Nasal Swab  Result Value Ref Range Status   MRSA by PCR Next Gen NOT DETECTED NOT DETECTED Final    Comment: (NOTE) The GeneXpert MRSA Assay (FDA approved for NASAL specimens only), is one component of a comprehensive MRSA colonization surveillance program. It is not intended to diagnose MRSA infection nor to guide or monitor treatment for MRSA infections. Test performance is not FDA approved in patients less than 67 years old. Performed at [facility], [address] [place], [place] [address]          Radiology Studies: No results found.      Scheduled Meds:  bictegravir-emtricitabine-tenofovir AF  1  tablet Oral Daily   Chlorhexidine Gluconate Cloth  6 each Topical Daily   enoxaparin (LOVENOX) injection  40 mg Subcutaneous Q24H   folic acid  1 mg Oral Daily   levETIRAcetam  500 mg Oral BID   magnesium oxide  400 mg Oral BID   multivitamin with minerals  1 tablet Oral Daily   phenobarbital  97.2 mg Oral Q8H   Followed by   phenobarbital  64.8 mg Oral Q8H   Followed by   Melene Muller ON 06/17/2023] phenobarbital  32.4 mg Oral Q8H   potassium chloride  40 mEq Oral BID   thiamine  100 mg Oral Daily   Or   thiamine  100 mg Intravenous Daily   Continuous Infusions:  magnesium sulfate bolus IVPB     promethazine (PHENERGAN) injection (IM or IVPB)       LOS: 4 days    Time spent: 35 minutes    Dorcas Carrow, MD [facility]

## 2023-06-19 NOTE — Discharge Summary (Signed)
Physician Discharge Summary  Gary [name] WRU:[id number] DOB: 1986-05-31 DOA: 06/11/2023  PCP: Patient, No Pcp Per  Admit date: 06/11/2023 Discharge date: 06/19/2023  Admitted From: Home Disposition: Home, homeless shelter  Recommendations for Outpatient Follow-up:  Follow up with PCP in 1-2 weeks  Discharge Condition: Stable CODE STATUS: Full code Diet recommendation: Regular diet, avoid alcohol  Discharge summary: Gentleman with alcoholism, HIV, schizophrenia, seizure disorder and multiple hospitalization, homelessness who was admitted from the emergency room where he presented with himself including alcohol poisoning.  He was not taking his antiviral medications and Keppra for about a week after released from jail.  In the ER he was hemodynamically stable.  Blood alcohol level was 317.  He was admitted for alcohol intoxication, withdrawal and nausea.  Patient was treated with Precedex, phenobarbital taper, Ativan as needed with good clinical recovery.  He was about 5 days in the hospital, recovering and we were planning for him to have long-term referral and follow-up, however he just decided to walk out of the hospital AGAINST MEDICAL ADVICE.  Patient was stable, he was alert awake and oriented.  He was able to make his healthcare decisions.  Did not agree to stay to complete phenobarbital taper but had no evidence of delirium tremens for the last 3 days.  He was tolerating regular diet. High risk of readmission due to alcoholism and also homelessness.   Discharge Diagnoses:  Active Problems:   Alcohol withdrawal (HCC)    Discharge Instructions   Allergies as of 06/15/2023       Reactions   Tegretol [carbamazepine] Other (See Comments)   Caused vertigo for 2 days after taking it   Caffeine Palpitations        Medication List     ASK your doctor about these medications    bictegravir-emtricitabine-tenofovir AF 50-200-25 MG Tabs tablet Commonly known as: BIKTARVY Take 1  tablet by mouth daily.   levETIRAcetam 500 MG tablet Commonly known as: KEPPRA Take 1 tablet (500 mg total) by mouth 2 (two) times daily.   ondansetron 8 MG disintegrating tablet Commonly known as: ZOFRAN-ODT Take 1 tablet by mouth every 8 hours as needed for nausea or vomiting.        Allergies  Allergen Reactions   Tegretol [Carbamazepine] Other (See Comments)    Caused vertigo for 2 days after taking it   Caffeine Palpitations    Consultations: None   Procedures/Studies: DG Foot 2 Views Right  Result Date: 06/11/2023 CLINICAL DATA:  First digit pain. EXAM: RIGHT FOOT - 2 VIEW COMPARISON:  Foot radiographs 03/11/2021 FINDINGS: There is no acute fracture or dislocation. Bony alignment is normal. There is no erosive change. There is minimal Achilles enthesopathy and inferior calcaneal spurring. The soft tissues are unremarkable. IMPRESSION: No acute finding in the foot. Electronically Signed   By: Lesia Hausen M.D.   On: 06/11/2023 08:19   VAS [place] UPPER EXTREMITY VENOUS DUPLEX  Result Date: 05/27/2023 UPPER VENOUS STUDY  Patient Name:  Gary [name]  Date of Exam:   05/27/2023 Medical Rec #: [id number]        Accession #:    [id number] Date of Birth: 1985-11-29        Patient Gender: M Patient Age:   37 years Exam Location:  [facility] Procedure:      VAS [place] UPPER EXTREMITY VENOUS DUPLEX Referring Phys: ABIGAIL CHAVEZ --------------------------------------------------------------------------------  Indications: Swelling Risk Factors: None identified. Limitations: Poor ultrasound/tissue interface. Comparison Study: No prior studies. Performing  Technologist: Chanda Busing RVT  Examination Guidelines: A complete evaluation includes B-mode imaging, spectral Doppler, color Doppler, and power Doppler as needed of all accessible portions of each vessel. Bilateral testing is considered an integral part of a complete examination. Limited examinations for reoccurring indications may be  performed as noted.  Right Findings: +----------+------------+---------+-----------+----------+-------+ RIGHT     CompressiblePhasicitySpontaneousPropertiesSummary +----------+------------+---------+-----------+----------+-------+ Subclavian    Full       Yes       Yes                      +----------+------------+---------+-----------+----------+-------+  Left Findings: +----------+------------+---------+-----------+----------+-------+ LEFT      CompressiblePhasicitySpontaneousPropertiesSummary +----------+------------+---------+-----------+----------+-------+ IJV           Full       Yes       Yes                      +----------+------------+---------+-----------+----------+-------+ Subclavian    Full       Yes       Yes                      +----------+------------+---------+-----------+----------+-------+ Axillary      Full       Yes       Yes                      +----------+------------+---------+-----------+----------+-------+ Brachial      Full                                          +----------+------------+---------+-----------+----------+-------+ Radial        Full                                          +----------+------------+---------+-----------+----------+-------+ Ulnar         Full                                          +----------+------------+---------+-----------+----------+-------+ Cephalic      Full                                          +----------+------------+---------+-----------+----------+-------+ Basilic       Full                                          +----------+------------+---------+-----------+----------+-------+  Summary:  Right: No evidence of thrombosis in the subclavian.  Left: No evidence of deep vein thrombosis in the upper extremity. No evidence of superficial vein thrombosis in the upper extremity.  *See table(s) above for measurements and observations.  Diagnosing physician: Heath Lark Electronically signed by Heath Lark on 05/27/2023 at 2:14:34 PM.    Final    DG CHEST PORT 1 VIEW  Result Date: 05/26/2023 CLINICAL DATA:  Shortness of breath. EXAM: PORTABLE CHEST 1 VIEW COMPARISON:  12/22/2022. FINDINGS: The heart size and mediastinal contours are stable. Lung volumes are low with strandy  atelectasis at the lung bases. No effusion or pneumothorax. No acute osseous abnormality. IMPRESSION: Low lung volumes with atelectasis at the lung bases. Electronically Signed   By: Thornell Sartorius M.D.   On: 05/26/2023 03:17   CT Head Wo Contrast  Result Date: 05/24/2023 CLINICAL DATA:  Mental status change seizure EXAM: CT HEAD WITHOUT CONTRAST TECHNIQUE: Contiguous axial images were obtained from the base of the skull through the vertex without intravenous contrast. RADIATION DOSE REDUCTION: This exam was performed according to the departmental dose-optimization program which includes automated exposure control, adjustment of the mA and/or kV according to patient size and/or use of iterative reconstruction technique. COMPARISON:  CT 05/21/2023 FINDINGS: Brain: No acute territorial infarction, hemorrhage or intracranial mass. Nonenlarged ventricles Vascular: Contrast media within the intracranial vessels and dural venous sinuses from recently performed contrast-enhanced examination. Skull: Normal. Negative for fracture or focal lesion. Sinuses/Orbits: No acute finding. Other: None IMPRESSION: No CT evidence for acute intracranial abnormality. Slight limitation secondary to previously administered contrast media Electronically Signed   By: Jasmine Pang M.D.   On: 05/24/2023 16:22   CT ABDOMEN PELVIS W CONTRAST  Result Date: 05/24/2023 CLINICAL DATA:  Pancreatitis, acute, severe EXAM: CT ABDOMEN AND PELVIS WITH CONTRAST TECHNIQUE: Multidetector CT imaging of the abdomen and pelvis was performed using the standard protocol following bolus administration of intravenous contrast. RADIATION DOSE  REDUCTION: This exam was performed according to the departmental dose-optimization program which includes automated exposure control, adjustment of the mA and/or kV according to patient size and/or use of iterative reconstruction technique. CONTRAST:  OMNIPAQUE IOHEXOL 300 MG/ML  SOLN COMPARISON:  12/22/2022 FINDINGS: Lower chest: No acute abnormality Hepatobiliary: Severe diffuse fatty infiltration of the liver. Gallbladder is distended. No visible stones or biliary ductal dilatation. Pancreas: No focal pancreatic mass or ductal dilatation. There is truncation of the pancreatic tail. No visible mass lesion. Surrounding inflammation around the pancreas compatible with acute pancreatitis. Spleen: No focal abnormality.  Normal size. Adrenals/Urinary Tract: No adrenal abnormality. No focal renal abnormality. No stones or hydronephrosis. Urinary bladder is unremarkable. Stomach/Bowel: Normal appendix. Stomach, large and small bowel grossly unremarkable. Vascular/Lymphatic: No evidence of aneurysm or adenopathy. Reproductive: No visible focal abnormality. Other: No free fluid or free air. Musculoskeletal: No acute bony abnormality. IMPRESSION: Changes of acute pancreatitis. No evidence of pancreatic necrosis or developing pseudocyst currently. Severe diffuse fatty infiltration of the liver. Electronically Signed   By: Charlett Nose M.D.   On: 05/24/2023 16:03   CT Head Wo Contrast  Result Date: 05/21/2023 CLINICAL DATA:  37 year old male status post seizure with fall. Possible ETOH withdrawal. HIV positive. EXAM: CT HEAD WITHOUT CONTRAST TECHNIQUE: Contiguous axial images were obtained from the base of the skull through the vertex without intravenous contrast. RADIATION DOSE REDUCTION: This exam was performed according to the departmental dose-optimization program which includes automated exposure control, adjustment of the mA and/or kV according to patient size and/or use of iterative reconstruction technique.  COMPARISON:  Head CT 01/20/2023 and earlier. FINDINGS: Brain: Stable cerebral volume, chronically reduced for age. No midline shift, ventriculomegaly, mass effect, evidence of mass lesion, intracranial hemorrhage or evidence of cortically based acute infarction. Gray-white matter differentiation remains within normal limits throughout the brain. Vascular: No suspicious intracranial vascular hyperdensity. Skull: Stable.  No acute osseous abnormality identified. Sinuses/Orbits: Visualized paranasal sinuses and mastoids are clear. Other: No acute orbit or scalp soft tissue injury identified. IMPRESSION: No acute intracranial abnormality or acute traumatic injury identified. Electronically Signed   By: Rexene Edison  Margo Aye M.D.   On: 05/21/2023 07:22   (Echo, Carotid, EGD, Colonoscopy, ERCP)    Subjective: Patient was seen in the morning rounds.  He was transferred out of ICU to the general medical floor.  Immediately after arriving to the floor, he decided to walk out and did not wait for this provider to come and talk to him.   Discharge Exam: Vitals:   06/15/23 1125 06/15/23 1147  BP: 101/60   Pulse: 78   Resp: 14   Temp:  97.7 F (36.5 C)  SpO2: 99%    Vitals:   06/15/23 0900 06/15/23 1000 06/15/23 1125 06/15/23 1147  BP: (!) 95/59 (!) 90/36 101/60   Pulse: 76 75 78   Resp: 20 18 14    Temp:    97.7 F (36.5 C)  TempSrc:    Oral  SpO2: 96% 96% 99%   Weight:      Height:          The results of significant diagnostics from this hospitalization (including imaging, microbiology, ancillary and laboratory) are listed below for reference.     Microbiology: Recent Results (from the past 240 hour(s))  MRSA Next Gen by PCR, Nasal     Status: None   Collection Time: 06/11/23  3:55 PM   Specimen: Nasal Mucosa; Nasal Swab  Result Value Ref Range Status   MRSA by PCR Next Gen NOT DETECTED NOT DETECTED Final    Comment: (NOTE) The GeneXpert MRSA Assay (FDA approved for NASAL specimens only), is one  component of a comprehensive MRSA colonization surveillance program. It is not intended to diagnose MRSA infection nor to guide or monitor treatment for MRSA infections. Test performance is not FDA approved in patients less than 52 years old. Performed at [facility], [address] [place], [place] [address]      Labs: BNP (last 3 results) No results for input(s): "BNP" in the last 8760 hours. Basic Metabolic Panel: Recent Labs  Lab 06/14/23 0305 06/15/23 0302  NA 136 134*  K 3.4* 3.7  CL 105 100  CO2 21* 25  GLUCOSE 94 107*  BUN <5* 9  CREATININE 0.55* 0.61  CALCIUM 8.7* 9.1  MG 1.6* 1.7  PHOS 4.3 4.1   Liver Function Tests: Recent Labs  Lab 06/14/23 0305 06/15/23 0302  AST 61* 47*  ALT 46* 50*  ALKPHOS 60 65  BILITOT 1.0 0.5  PROT 5.7* 6.2*  ALBUMIN 2.9* 3.1*   No results for input(s): "LIPASE", "AMYLASE" in the last 168 hours. No results for input(s): "AMMONIA" in the last 168 hours. CBC: Recent Labs  Lab 06/14/23 0305 06/15/23 0302  WBC 2.9* 4.9  NEUTROABS 1.6* 3.1  HGB 12.5* 12.6*  HCT 39.0 38.4*  MCV 99.7 100.8*  PLT 93* 121*   Cardiac Enzymes: No results for input(s): "CKTOTAL", "CKMB", "CKMBINDEX", "TROPONINI" in the last 168 hours. BNP: Invalid input(s): "POCBNP" CBG: Recent Labs  Lab 06/14/23 0801 06/14/23 1150 06/14/23 1706 06/15/23 0733 06/15/23 1130  GLUCAP 97 87 139* 113* 99   D-Dimer No results for input(s): "DDIMER" in the last 72 hours. Hgb A1c No results for input(s): "HGBA1C" in the last 72 hours. Lipid Profile No results for input(s): "CHOL", "HDL", "LDLCALC", "TRIG", "CHOLHDL", "LDLDIRECT" in the last 72 hours. Thyroid function studies No results for input(s): "TSH", "T4TOTAL", "T3FREE", "THYROIDAB" in the last 72 hours.  Invalid input(s): "FREET3" Anemia work up No results for input(s): "VITAMINB12", "FOLATE", "FERRITIN", "TIBC", "IRON", "RETICCTPCT" in the last 72 hours.  Urinalysis     Component Value Date/Time   COLORURINE YELLOW 05/24/2023 1747   APPEARANCEUR CLEAR 05/24/2023 1747   APPEARANCEUR Clear 11/15/2014 1755   LABSPEC 1.024 05/24/2023 1747   LABSPEC 1.002 11/15/2014 1755   PHURINE 5.0 05/24/2023 1747   GLUCOSEU NEGATIVE 05/24/2023 1747   GLUCOSEU Negative 11/15/2014 1755   HGBUR NEGATIVE 05/24/2023 1747   BILIRUBINUR NEGATIVE 05/24/2023 1747   BILIRUBINUR Negative 11/15/2014 1755   KETONESUR 80 (A) 05/24/2023 1747   PROTEINUR NEGATIVE 05/24/2023 1747   NITRITE NEGATIVE 05/24/2023 1747   LEUKOCYTESUR NEGATIVE 05/24/2023 1747   LEUKOCYTESUR Negative 11/15/2014 1755   Sepsis Labs Recent Labs  Lab 06/14/23 0305 06/15/23 0302  WBC 2.9* 4.9   Microbiology Recent Results (from the past 240 hour(s))  MRSA Next Gen by PCR, Nasal     Status: None   Collection Time: 06/11/23  3:55 PM   Specimen: Nasal Mucosa; Nasal Swab  Result Value Ref Range Status   MRSA by PCR Next Gen NOT DETECTED NOT DETECTED Final    Comment: (NOTE) The GeneXpert MRSA Assay (FDA approved for NASAL specimens only), is one component of a comprehensive MRSA colonization surveillance program. It is not intended to diagnose MRSA infection nor to guide or monitor treatment for MRSA infections. Test performance is not FDA approved in patients less than 17 years old. Performed at [facility], [address], [place], [place] [address]      Time coordinating discharge: 0 minutes  SIGNED:   Dorcas Carrow, MD  [facility] 06/19/2023, 3:09 PM

## 2023-06-28 ENCOUNTER — Encounter (HOSPITAL_COMMUNITY): Payer: Self-pay

## 2023-06-28 ENCOUNTER — Other Ambulatory Visit: Payer: Self-pay

## 2023-06-28 ENCOUNTER — Emergency Department (HOSPITAL_COMMUNITY)
Admission: EM | Admit: 2023-06-28 | Discharge: 2023-06-29 | Disposition: A | Discharge status: Home / Self Care | Payer: MEDICAID | Attending: Emergency Medicine | Admitting: Emergency Medicine

## 2023-06-28 DIAGNOSIS — F10120 Alcohol abuse with intoxication, uncomplicated: Secondary | ICD-10-CM | POA: Insufficient documentation

## 2023-06-28 DIAGNOSIS — Z5900 Homelessness unspecified: Secondary | ICD-10-CM | POA: Diagnosis not present

## 2023-06-28 DIAGNOSIS — Y908 Blood alcohol level of 240 mg/100 ml or more: Secondary | ICD-10-CM | POA: Diagnosis not present

## 2023-06-28 DIAGNOSIS — Z21 Asymptomatic human immunodeficiency virus [HIV] infection status: Secondary | ICD-10-CM | POA: Insufficient documentation

## 2023-06-28 DIAGNOSIS — F1010 Alcohol abuse, uncomplicated: Secondary | ICD-10-CM

## 2023-06-28 LAB — CBG MONITORING, ED: Glucose-Capillary: 92 mg/dL (ref 70–99)

## 2023-06-28 MED ORDER — THIAMINE HCL 100 MG PO TABS
100.0000 mg | ORAL_TABLET | Freq: Once | ORAL | Status: AC
  Administered 2023-06-29: 100 mg via ORAL
  Filled 2023-06-28 (×2): qty 1

## 2023-06-28 MED ORDER — ADULT MULTIVITAMIN W/MINERALS CH
1.0000 | ORAL_TABLET | Freq: Once | ORAL | Status: AC
  Administered 2023-06-29: 1 via ORAL
  Filled 2023-06-28: qty 1

## 2023-06-28 MED ORDER — LORAZEPAM 2 MG/ML IJ SOLN: 1.0000 mg | Freq: Once | INTRAMUSCULAR | Status: DC

## 2023-06-28 MED ORDER — SODIUM CHLORIDE 0.9 % IV BOLUS
1000.0000 mL | Freq: Once | INTRAVENOUS | Status: AC
  Administered 2023-06-29: 1000 mL via INTRAVENOUS

## 2023-06-28 MED ORDER — FOLIC ACID 1 MG PO TABS
1.0000 mg | ORAL_TABLET | Freq: Once | ORAL | Status: AC
  Administered 2023-06-29: 1 mg via ORAL
  Filled 2023-06-28: qty 1

## 2023-06-28 NOTE — BH Assessment (Signed)
TTS attempted to assess pt. Pt is asleep at this time and RN staff will notify TTS when pt is awake and alert.

## 2023-06-28 NOTE — ED Provider Notes (Signed)
[name]   CSN: [id number] Arrival date & time: 06/28/23  1905     History  Chief Complaint  Patient presents with   Alcohol Intoxication    Gary [name] is a 37 y.o. male with PMHx HIV, bipolar 2 disorder, anxiety, alcohol abuse, PTSD, schizophrenia, seizures who presents to ED requesting to be voluntarily committed. Patient stating that he was drinking earlier today and started talking to some passerby's stating that he needs help and never wants to drink again. The passerby's called 911 for the patient. Patient declining any specific symptoms at this time, just stating that he feels horrible all over. CIWA 5 on arrival.   Alcohol Intoxication       Home Medications Prior to Admission medications   Medication Sig Start Date End Date Taking? Authorizing Provider  bictegravir-emtricitabine-tenofovir AF (BIKTARVY) 50-200-25 MG TABS tablet Take 1 tablet by mouth daily. 04/12/23 08/10/23  Vu, Gary Phoenix, MD  levETIRAcetam (KEPPRA) 500 MG tablet Take 1 tablet (500 mg total) by mouth 2 (two) times daily. 05/21/23   Gary Lefevre, MD  ondansetron (ZOFRAN-ODT) 8 MG disintegrating tablet Take 1 tablet by mouth every 8 hours as needed for nausea or vomiting. Patient not taking: Reported on 05/26/2023 05/21/23   Gary Lefevre, MD  famotidine (PEPCID) 20 MG tablet Take 1 tablet (20 mg total) by mouth 2 (two) times daily. Patient not taking: Reported on 06/01/2019 05/14/19 06/02/19  Gary Core, MD      Allergies    Tegretol [carbamazepine] and Caffeine    Review of Systems   Review of Systems  Psychiatric/Behavioral:         Alcohol intoxication    Physical Exam Updated Vital Signs BP 111/80 (BP Location: Right Arm)   Pulse 76   Temp 98.1 F (36.7 C) (Oral)   Resp 16   Ht 6' (1.829 m)   Wt 70 kg   SpO2 95%   BMI 20.93 kg/m  Physical Exam Vitals and nursing note reviewed.  Constitutional:      General: He is  not in acute distress. HENT:     Head: Normocephalic and atraumatic.     Mouth/Throat:     Mouth: Mucous membranes are moist.  Eyes:     General: No scleral icterus.       Right eye: No discharge.        Left eye: No discharge.     Conjunctiva/sclera: Conjunctivae normal.  Cardiovascular:     Rate and Rhythm: Normal rate and regular rhythm.     Pulses: Normal pulses.     Heart sounds: Normal heart sounds. No murmur heard. Pulmonary:     Effort: Pulmonary effort is normal. No respiratory distress.     Breath sounds: Normal breath sounds. No wheezing, rhonchi or rales.  Abdominal:     General: Abdomen is flat. Bowel sounds are normal.     Palpations: Abdomen is soft.     Tenderness: There is no abdominal tenderness.  Musculoskeletal:     Right lower leg: No edema.     Left lower leg: No edema.  Skin:    General: Skin is warm and dry.     Findings: No rash.  Neurological:     General: No focal deficit present.     Mental Status: He is alert. Mental status is at baseline.     Comments: GCS 15. Speech is goal oriented.  Patient moves extremities without ataxia.  Psychiatric:        Mood and Affect: Mood normal.     ED Results / Procedures / Treatments   Labs (all labs ordered are listed, but only abnormal results are displayed) Labs Reviewed  CBC WITH DIFFERENTIAL/PLATELET  COMPREHENSIVE METABOLIC PANEL  CBG MONITORING, ED    EKG None  Radiology No results found.  Procedures Procedures    Medications Ordered in ED Medications  LORazepam (ATIVAN) injection 1 mg (has no administration in time range)  thiamine (VITAMIN B1) tablet 100 mg (has no administration in time range)  multivitamin with minerals tablet 1 tablet (has no administration in time range)  folic acid (FOLVITE) tablet 1 mg (has no administration in time range)  sodium chloride 0.9 % bolus 1,000 mL (has no administration in time range)    ED Course/ Medical Decision Making/ A&P                                  Medical Decision Making Amount and/or Complexity of Data Reviewed Labs: ordered.  Risk OTC drugs. Prescription drug management.   This patient presents to the ED for concern of alcohol detox, this involves an extensive number of treatment options, and is a complaint that carries with it a high risk of complications and morbidity.    Co morbidities that complicate the patient evaluation  HIV, bipolar 2 disorder, anxiety, alcohol abuse, PTSD, schizophrenia, seizures   Lab Tests:  I Ordered, and personally interpreted labs.  The pertinent results include:  -ETOH: pending -POC: 92 -CMP: pending -CBC: pending   Problem List / ED Course / Critical interventions / Medication management  Patient presents to ED requesting to be involuntary committed.  Patient not reporting any specific symptoms but stating that he feels horrible all over.  Patient unsure of his last drink but states it was earlier today.  Patient denying  SI.  When asked patient about HI, he states "I will do anything."  Patient also stating that he has auditory hallucinations that are baseline for him. Starting basic lab workup.  Provided patient with vitamin supplementation and IV fluids. I have reviewed the patients home medicines and have made adjustments as needed  10PM Care of [facility] transferred to [facility] at the end of my shift as the patient will require reassessment once labs/imaging have resulted. Patient presentation, ED course, and plan of care discussed with review of all pertinent labs and imaging. Please see his/her note for further details regarding further ED course and disposition. Plan at time of handoff is reassess patient after lab result. As of right now, it seems patient should be appropriate for discharge if labs are reassuring. This may be altered or completely changed at the discretion of the oncoming team pending results of further workup.    Social Determinants  of Health:  homelessness          Final Clinical Impression(s) / ED Diagnoses Final diagnoses:  None    Rx / DC Orders ED Discharge Orders     None         Gary [name] 06/28/23 2205    Wynetta Fines, MD 06/29/23 0020

## 2023-06-28 NOTE — ED Notes (Signed)
Provided pt with a urinal 

## 2023-06-28 NOTE — ED Triage Notes (Signed)
[place] was [place].  Patient was alert when EMS arrived but he became combative.  Patient has a hx of seizures and he consumed alcohol today starting "when the sun came up".  VS en route BP 104/60, HR 92, CBG 100 92% on RA

## 2023-06-28 NOTE — ED Notes (Signed)
Pt states he wants to be voluntarily committed

## 2023-06-28 NOTE — ED Notes (Signed)
Contacted lab and they do not have the blood that was sent. Labs recollected and sent.

## 2023-06-28 NOTE — ED Provider Notes (Signed)
37 yo male with history of alcohol abuse, requesting detox. Denies SI, HI. Pending labs and TTS. Physical Exam  BP 118/72   Pulse 61   Temp 98.4 F (36.9 C) (Oral)   Resp 15   Ht 6' (1.829 m)   Wt 70 kg   SpO2 95%   BMI 20.93 kg/m   Physical Exam  Procedures  Procedures  ED Course / MDM    Medical Decision Making Amount and/or Complexity of Data Reviewed Labs: ordered.  Risk OTC drugs. Prescription drug management.   Patient was observed in the emergency department, vitals remained stable.  He declined to speak to counselor and instead chose to sleep.  His initial CMP was concerning for significant hypokalemia potassium of 2.4 with calcium of 6.0, he was provided with oral potassium supplement and a BMP was obtained with improved results, suspect hemolyzed sample.  Magnesium level normal.  CBC without significant findings compared to prior.  UDS is positive for benzos and barbiturates alcohol is elevated 266. Patient is medically cleared, may seek detox outpatient (provided with resource lists), discussed [facility].        Jeannie Fend, PA-C 06/29/23 6440    Nira Conn, MD 06/29/23 [phone]

## 2023-06-29 LAB — RAPID URINE DRUG SCREEN, HOSP PERFORMED
Amphetamines: NOT DETECTED
Barbiturates: POSITIVE — AB
Benzodiazepines: POSITIVE — AB
Cocaine: NOT DETECTED
Opiates: NOT DETECTED
Tetrahydrocannabinol: NOT DETECTED

## 2023-06-29 LAB — CBC WITH DIFFERENTIAL/PLATELET
Abs Immature Granulocytes: 0.01 10*3/uL (ref 0.00–0.07)
Basophils Absolute: 0 10*3/uL (ref 0.0–0.1)
Basophils Relative: 1 %
Eosinophils Absolute: 0.1 10*3/uL (ref 0.0–0.5)
Eosinophils Relative: 3 %
HCT: 34.5 % — ABNORMAL LOW (ref 39.0–52.0)
Hemoglobin: 11.4 g/dL — ABNORMAL LOW (ref 13.0–17.0)
Immature Granulocytes: 0 %
Lymphocytes Relative: 47 %
Lymphs Abs: 1.5 10*3/uL (ref 0.7–4.0)
MCH: 32.2 pg (ref 26.0–34.0)
MCHC: 33 g/dL (ref 30.0–36.0)
MCV: 97.5 fL (ref 80.0–100.0)
Monocytes Absolute: 0.2 10*3/uL (ref 0.1–1.0)
Monocytes Relative: 7 %
Neutro Abs: 1.4 10*3/uL — ABNORMAL LOW (ref 1.7–7.7)
Neutrophils Relative %: 42 %
Platelets: 120 10*3/uL — ABNORMAL LOW (ref 150–400)
RBC: 3.54 MIL/uL — ABNORMAL LOW (ref 4.22–5.81)
RDW: 14.7 % (ref 11.5–15.5)
WBC: 3.2 10*3/uL — ABNORMAL LOW (ref 4.0–10.5)
nRBC: 0 % (ref 0.0–0.2)

## 2023-06-29 LAB — COMPREHENSIVE METABOLIC PANEL
ALT: 34 U/L (ref 0–44)
AST: 47 U/L — ABNORMAL HIGH (ref 15–41)
Albumin: 2.3 g/dL — ABNORMAL LOW (ref 3.5–5.0)
Alkaline Phosphatase: 60 U/L (ref 38–126)
Anion gap: 7 (ref 5–15)
BUN: <5 mg/dL — ABNORMAL LOW (ref 6–20)
CO2: 20 mmol/L — ABNORMAL LOW (ref 22–32)
Calcium: 6 mg/dL — CL (ref 8.9–10.3)
Chloride: 114 mmol/L — ABNORMAL HIGH (ref 98–111)
Creatinine, Ser: 0.36 mg/dL — ABNORMAL LOW (ref 0.61–1.24)
GFR, Estimated: >60 mL/min (ref >60)
Glucose, Bld: 76 mg/dL (ref 70–99)
Potassium: 2.4 mmol/L — CL (ref 3.5–5.1)
Sodium: 141 mmol/L (ref 135–145)
Total Bilirubin: 0.4 mg/dL (ref 0.3–1.2)
Total Protein: 4.7 g/dL — ABNORMAL LOW (ref 6.5–8.1)

## 2023-06-29 LAB — BASIC METABOLIC PANEL
Anion gap: 10 (ref 5–15)
BUN: <5 mg/dL — ABNORMAL LOW (ref 6–20)
CO2: 25 mmol/L (ref 22–32)
Calcium: 7.7 mg/dL — ABNORMAL LOW (ref 8.9–10.3)
Chloride: 105 mmol/L (ref 98–111)
Creatinine, Ser: 0.47 mg/dL — ABNORMAL LOW (ref 0.61–1.24)
GFR, Estimated: >60 mL/min (ref >60)
Glucose, Bld: 88 mg/dL (ref 70–99)
Potassium: 3.2 mmol/L — ABNORMAL LOW (ref 3.5–5.1)
Sodium: 140 mmol/L (ref 135–145)

## 2023-06-29 LAB — MAGNESIUM: Magnesium: 1.8 mg/dL (ref 1.7–2.4)

## 2023-06-29 LAB — ETHANOL: Alcohol, Ethyl (B): 266 mg/dL — ABNORMAL HIGH (ref <10)

## 2023-06-29 MED ORDER — IBUPROFEN 800 MG PO TABS
800.0000 mg | ORAL_TABLET | Freq: Once | ORAL | Status: AC
  Administered 2023-06-29: 800 mg via ORAL
  Filled 2023-06-29: qty 1

## 2023-06-29 MED ORDER — ONDANSETRON 8 MG PO TBDP
8.0000 mg | ORAL_TABLET | Freq: Once | ORAL | Status: AC
  Administered 2023-06-29: 8 mg via ORAL
  Filled 2023-06-29: qty 1

## 2023-06-29 MED ORDER — POTASSIUM CHLORIDE CRYS ER 20 MEQ PO TBCR
40.0000 meq | EXTENDED_RELEASE_TABLET | Freq: Once | ORAL | Status: AC
  Administered 2023-06-29: 40 meq via ORAL
  Filled 2023-06-29: qty 2

## 2023-06-29 NOTE — ED Notes (Signed)
Per EAVWU@ Main Lab- recollect needed for light green tube. [employer]

## 2023-06-29 NOTE — ED Notes (Signed)
Repeat light green top tube sent to lab.

## 2023-06-29 NOTE — ED Notes (Signed)
Pt was upset that he was being discharged. Informed pt of other options and that he would be medically cleared to go to a facility or rehab. Pt stated that he wasn't going to a facility. Pt ambulated without assistance out of the front door of the [facility].

## 2023-07-06 ENCOUNTER — Other Ambulatory Visit: Payer: Self-pay

## 2023-07-06 ENCOUNTER — Emergency Department (HOSPITAL_COMMUNITY)
Admission: EM | Admit: 2023-07-06 | Discharge: 2023-07-07 | Discharge status: Against Medical Advice | Payer: Self-pay | Attending: Emergency Medicine | Admitting: Emergency Medicine

## 2023-07-06 DIAGNOSIS — Z5321 Procedure and treatment not carried out due to patient leaving prior to being seen by health care provider: Secondary | ICD-10-CM | POA: Insufficient documentation

## 2023-07-06 DIAGNOSIS — F10129 Alcohol abuse with intoxication, unspecified: Secondary | ICD-10-CM | POA: Diagnosis present

## 2023-07-06 NOTE — ED Provider Triage Note (Signed)
Emergency Medicine Provider Triage Evaluation Note  Gary [name] , a 37 y.o. male  was evaluated in triage.  Pt brought in by EMS for ETOH. Apparently was found unconscious. Pt states he "has infections" but does not elaborate  Review of Systems  Positive: ETOH Negative:   Physical Exam  Ht 6' (1.829 m)   Wt 70 kg   BMI 20.93 kg/m  Gen:   Awake, no distress   Resp:  Normal effort  MSK:   Moves extremities without difficulty  Other:  Resting comfortably in wheelchair  Medical Decision Making  Medically screening exam initiated at 5:34 PM.  Appropriate orders placed.  Gary [name] was informed that the remainder of the evaluation will be completed by another provider, this initial triage assessment does not replace that evaluation, and the importance of remaining in the ED until their evaluation is complete.     Gary Monks, PA-C 07/06/23 1734

## 2023-07-06 NOTE — ED Triage Notes (Signed)
Pt BIB EMS found laying in the street, reportedly unconscious. Pt hx etoh states he "has infections"

## 2023-07-07 ENCOUNTER — Other Ambulatory Visit: Payer: Self-pay

## 2023-07-07 ENCOUNTER — Encounter (HOSPITAL_COMMUNITY): Payer: Self-pay | Admitting: *Deleted

## 2023-07-07 ENCOUNTER — Emergency Department (HOSPITAL_COMMUNITY)
Admission: EM | Admit: 2023-07-07 | Discharge: 2023-07-08 | Discharge status: Against Medical Advice | Payer: MEDICAID | Attending: Emergency Medicine | Admitting: Emergency Medicine

## 2023-07-07 DIAGNOSIS — F10929 Alcohol use, unspecified with intoxication, unspecified: Secondary | ICD-10-CM | POA: Diagnosis not present

## 2023-07-07 DIAGNOSIS — Z5321 Procedure and treatment not carried out due to patient leaving prior to being seen by health care provider: Secondary | ICD-10-CM | POA: Insufficient documentation

## 2023-07-07 DIAGNOSIS — Y908 Blood alcohol level of 240 mg/100 ml or more: Secondary | ICD-10-CM | POA: Insufficient documentation

## 2023-07-07 LAB — COMPREHENSIVE METABOLIC PANEL
ALT: 35 U/L (ref 0–44)
AST: 54 U/L — ABNORMAL HIGH (ref 15–41)
Albumin: 3 g/dL — ABNORMAL LOW (ref 3.5–5.0)
Alkaline Phosphatase: 89 U/L (ref 38–126)
Anion gap: 12 (ref 5–15)
BUN: <5 mg/dL — ABNORMAL LOW (ref 6–20)
CO2: 23 mmol/L (ref 22–32)
Calcium: 7.7 mg/dL — ABNORMAL LOW (ref 8.9–10.3)
Chloride: 102 mmol/L (ref 98–111)
Creatinine, Ser: 0.62 mg/dL (ref 0.61–1.24)
GFR, Estimated: >60 mL/min (ref >60)
Glucose, Bld: 102 mg/dL — ABNORMAL HIGH (ref 70–99)
Potassium: 4.7 mmol/L (ref 3.5–5.1)
Sodium: 137 mmol/L (ref 135–145)
Total Bilirubin: 0.8 mg/dL (ref 0.3–1.2)
Total Protein: 6.6 g/dL (ref 6.5–8.1)

## 2023-07-07 LAB — RAPID URINE DRUG SCREEN, HOSP PERFORMED
Amphetamines: NOT DETECTED
Barbiturates: POSITIVE — AB
Benzodiazepines: POSITIVE — AB
Cocaine: NOT DETECTED
Opiates: NOT DETECTED
Tetrahydrocannabinol: NOT DETECTED

## 2023-07-07 LAB — ETHANOL: Alcohol, Ethyl (B): 379 mg/dL (ref <10)

## 2023-07-07 NOTE — ED Notes (Signed)
Called patient for vitals- no answer. Pulled OTF.

## 2023-07-07 NOTE — ED Triage Notes (Signed)
The pt is here for alcohol intoxication he was outside a [facility] facility and he was told by someone  there to leave and called the police  the police officer told the pt that he would go to jail unless he wanted to go to the hospital  so the pt came here he was dropped off by the police officer

## 2023-07-07 NOTE — ED Notes (Signed)
Called patient 3x for vitals, no answer.

## 2023-07-07 NOTE — ED Triage Notes (Signed)
The pt thinks he has an infection on his nose and face for awhile

## 2023-07-16 ENCOUNTER — Inpatient Hospital Stay (HOSPITAL_COMMUNITY)
Admission: EM | Admit: 2023-07-16 | Discharge: 2023-07-18 | DRG: 439 | Discharge status: Against Medical Advice | Payer: MEDICAID | Attending: Internal Medicine | Admitting: Internal Medicine

## 2023-07-16 ENCOUNTER — Encounter (HOSPITAL_COMMUNITY): Payer: Self-pay

## 2023-07-16 ENCOUNTER — Other Ambulatory Visit: Payer: Self-pay

## 2023-07-16 DIAGNOSIS — Z5329 Procedure and treatment not carried out because of patient's decision for other reasons: Secondary | ICD-10-CM | POA: Diagnosis not present

## 2023-07-16 DIAGNOSIS — R7989 Other specified abnormal findings of blood chemistry: Secondary | ICD-10-CM | POA: Insufficient documentation

## 2023-07-16 DIAGNOSIS — F10920 Alcohol use, unspecified with intoxication, uncomplicated: Principal | ICD-10-CM

## 2023-07-16 DIAGNOSIS — Z8669 Personal history of other diseases of the nervous system and sense organs: Secondary | ICD-10-CM | POA: Diagnosis not present

## 2023-07-16 DIAGNOSIS — F10930 Alcohol use, unspecified with withdrawal, uncomplicated: Secondary | ICD-10-CM | POA: Diagnosis not present

## 2023-07-16 DIAGNOSIS — Z811 Family history of alcohol abuse and dependence: Secondary | ICD-10-CM | POA: Diagnosis not present

## 2023-07-16 DIAGNOSIS — F1020 Alcohol dependence, uncomplicated: Secondary | ICD-10-CM | POA: Diagnosis present

## 2023-07-16 DIAGNOSIS — K760 Fatty (change of) liver, not elsewhere classified: Secondary | ICD-10-CM | POA: Diagnosis present

## 2023-07-16 DIAGNOSIS — E871 Hypo-osmolality and hyponatremia: Secondary | ICD-10-CM | POA: Diagnosis present

## 2023-07-16 DIAGNOSIS — Z5900 Homelessness unspecified: Secondary | ICD-10-CM | POA: Diagnosis not present

## 2023-07-16 DIAGNOSIS — E876 Hypokalemia: Secondary | ICD-10-CM | POA: Diagnosis present

## 2023-07-16 DIAGNOSIS — F10229 Alcohol dependence with intoxication, unspecified: Secondary | ICD-10-CM | POA: Diagnosis present

## 2023-07-16 DIAGNOSIS — K7010 Alcoholic hepatitis without ascites: Secondary | ICD-10-CM | POA: Diagnosis present

## 2023-07-16 DIAGNOSIS — F10239 Alcohol dependence with withdrawal, unspecified: Secondary | ICD-10-CM | POA: Diagnosis present

## 2023-07-16 DIAGNOSIS — R64 Cachexia: Secondary | ICD-10-CM | POA: Diagnosis present

## 2023-07-16 DIAGNOSIS — F17210 Nicotine dependence, cigarettes, uncomplicated: Secondary | ICD-10-CM | POA: Diagnosis present

## 2023-07-16 DIAGNOSIS — G8929 Other chronic pain: Secondary | ICD-10-CM | POA: Diagnosis present

## 2023-07-16 DIAGNOSIS — Z79899 Other long term (current) drug therapy: Secondary | ICD-10-CM

## 2023-07-16 DIAGNOSIS — Z800 Family history of malignant neoplasm of digestive organs: Secondary | ICD-10-CM

## 2023-07-16 DIAGNOSIS — R109 Unspecified abdominal pain: Secondary | ICD-10-CM | POA: Diagnosis present

## 2023-07-16 DIAGNOSIS — Z681 Body mass index (BMI) 19 or less, adult: Secondary | ICD-10-CM | POA: Diagnosis not present

## 2023-07-16 DIAGNOSIS — Z888 Allergy status to other drugs, medicaments and biological substances status: Secondary | ICD-10-CM

## 2023-07-16 DIAGNOSIS — K852 Alcohol induced acute pancreatitis without necrosis or infection: Principal | ICD-10-CM | POA: Diagnosis present

## 2023-07-16 DIAGNOSIS — Z21 Asymptomatic human immunodeficiency virus [HIV] infection status: Secondary | ICD-10-CM | POA: Diagnosis present

## 2023-07-16 DIAGNOSIS — B20 Human immunodeficiency virus [HIV] disease: Secondary | ICD-10-CM | POA: Diagnosis present

## 2023-07-16 DIAGNOSIS — Y908 Blood alcohol level of 240 mg/100 ml or more: Secondary | ICD-10-CM | POA: Diagnosis present

## 2023-07-16 DIAGNOSIS — Z87898 Personal history of other specified conditions: Secondary | ICD-10-CM | POA: Diagnosis not present

## 2023-07-16 DIAGNOSIS — F10939 Alcohol use, unspecified with withdrawal, unspecified: Secondary | ICD-10-CM | POA: Diagnosis present

## 2023-07-16 LAB — COMPREHENSIVE METABOLIC PANEL
ALT: 49 U/L — ABNORMAL HIGH (ref 0–44)
AST: 92 U/L — ABNORMAL HIGH (ref 15–41)
Albumin: 3.2 g/dL — ABNORMAL LOW (ref 3.5–5.0)
Alkaline Phosphatase: 112 U/L (ref 38–126)
Anion gap: 12 (ref 5–15)
BUN: <5 mg/dL — ABNORMAL LOW (ref 6–20)
CO2: 23 mmol/L (ref 22–32)
Calcium: 7.7 mg/dL — ABNORMAL LOW (ref 8.9–10.3)
Chloride: 97 mmol/L — ABNORMAL LOW (ref 98–111)
Creatinine, Ser: 0.58 mg/dL — ABNORMAL LOW (ref 0.61–1.24)
GFR, Estimated: >60 mL/min (ref >60)
Glucose, Bld: 126 mg/dL — ABNORMAL HIGH (ref 70–99)
Potassium: 3.1 mmol/L — ABNORMAL LOW (ref 3.5–5.1)
Sodium: 132 mmol/L — ABNORMAL LOW (ref 135–145)
Total Bilirubin: 0.7 mg/dL (ref 0.3–1.2)
Total Protein: 7.1 g/dL (ref 6.5–8.1)

## 2023-07-16 LAB — CBC WITH DIFFERENTIAL/PLATELET
Abs Immature Granulocytes: 0.01 10*3/uL (ref 0.00–0.07)
Basophils Absolute: 0 10*3/uL (ref 0.0–0.1)
Basophils Relative: 1 %
Eosinophils Absolute: 0 10*3/uL (ref 0.0–0.5)
Eosinophils Relative: 1 %
HCT: 40.1 % (ref 39.0–52.0)
Hemoglobin: 13.5 g/dL (ref 13.0–17.0)
Immature Granulocytes: 0 %
Lymphocytes Relative: 15 %
Lymphs Abs: 0.8 10*3/uL (ref 0.7–4.0)
MCH: 32 pg (ref 26.0–34.0)
MCHC: 33.7 g/dL (ref 30.0–36.0)
MCV: 95 fL (ref 80.0–100.0)
Monocytes Absolute: 0.3 10*3/uL (ref 0.1–1.0)
Monocytes Relative: 6 %
Neutro Abs: 4.3 10*3/uL (ref 1.7–7.7)
Neutrophils Relative %: 77 %
Platelets: 120 10*3/uL — ABNORMAL LOW (ref 150–400)
RBC: 4.22 MIL/uL (ref 4.22–5.81)
RDW: 15.2 % (ref 11.5–15.5)
WBC: 5.6 10*3/uL (ref 4.0–10.5)
nRBC: 0 % (ref 0.0–0.2)

## 2023-07-16 LAB — LIPASE, BLOOD: Lipase: 598 U/L — ABNORMAL HIGH (ref 11–51)

## 2023-07-16 LAB — ETHANOL: Alcohol, Ethyl (B): 377 mg/dL (ref <10)

## 2023-07-16 MED ORDER — LACTATED RINGERS IV SOLN: INTRAVENOUS | Status: DC

## 2023-07-16 MED ORDER — METOCLOPRAMIDE HCL 5 MG/ML IJ SOLN
10.0000 mg | Freq: Once | INTRAMUSCULAR | Status: AC
  Administered 2023-07-16: 10 mg via INTRAVENOUS
  Filled 2023-07-16: qty 2

## 2023-07-16 MED ORDER — THIAMINE HCL 100 MG/ML IJ SOLN
100.0000 mg | Freq: Every day | INTRAMUSCULAR | Status: DC
  Administered 2023-07-17: 100 mg via INTRAVENOUS
  Filled 2023-07-16: qty 2

## 2023-07-16 MED ORDER — POTASSIUM CHLORIDE 10 MEQ/100ML IV SOLN
10.0000 meq | INTRAVENOUS | Status: AC
  Administered 2023-07-17 (×2): 10 meq via INTRAVENOUS
  Filled 2023-07-16 (×2): qty 100

## 2023-07-16 MED ORDER — ENOXAPARIN SODIUM 40 MG/0.4ML IJ SOSY
40.0000 mg | PREFILLED_SYRINGE | Freq: Every day | INTRAMUSCULAR | Status: DC
  Administered 2023-07-16 – 2023-07-17 (×2): 40 mg via SUBCUTANEOUS
  Filled 2023-07-16 (×2): qty 0.4

## 2023-07-16 MED ORDER — SODIUM CHLORIDE 0.9 % IV SOLN
12.5000 mg | Freq: Four times a day (QID) | INTRAVENOUS | Status: DC | PRN
  Administered 2023-07-16 – 2023-07-18 (×4): 12.5 mg via INTRAVENOUS
  Filled 2023-07-16: qty 0.5
  Filled 2023-07-16 (×3): qty 12.5

## 2023-07-16 MED ORDER — PANTOPRAZOLE SODIUM 40 MG IV SOLR
40.0000 mg | Freq: Once | INTRAVENOUS | Status: AC
  Administered 2023-07-16: 40 mg via INTRAVENOUS
  Filled 2023-07-16: qty 10

## 2023-07-16 MED ORDER — FOLIC ACID 1 MG PO TABS
1.0000 mg | ORAL_TABLET | Freq: Every day | ORAL | Status: DC
  Administered 2023-07-16 – 2023-07-18 (×3): 1 mg via ORAL
  Filled 2023-07-16 (×3): qty 1

## 2023-07-16 MED ORDER — ADULT MULTIVITAMIN W/MINERALS CH
1.0000 | ORAL_TABLET | Freq: Every day | ORAL | Status: DC
  Administered 2023-07-16 – 2023-07-18 (×3): 1 via ORAL
  Filled 2023-07-16 (×3): qty 1

## 2023-07-16 MED ORDER — LORAZEPAM 1 MG PO TABS
1.0000 mg | ORAL_TABLET | ORAL | Status: DC | PRN
  Administered 2023-07-17: 3 mg via ORAL
  Filled 2023-07-16 (×2): qty 3

## 2023-07-16 MED ORDER — THIAMINE MONONITRATE 100 MG PO TABS
100.0000 mg | ORAL_TABLET | Freq: Every day | ORAL | Status: DC
  Administered 2023-07-16 – 2023-07-18 (×2): 100 mg via ORAL
  Filled 2023-07-16 (×2): qty 1

## 2023-07-16 MED ORDER — ALUM & MAG HYDROXIDE-SIMETH 200-200-20 MG/5ML PO SUSP
30.0000 mL | Freq: Once | ORAL | Status: AC
  Administered 2023-07-16: 30 mL via ORAL
  Filled 2023-07-16: qty 30

## 2023-07-16 MED ORDER — LACTATED RINGERS IV BOLUS
1000.0000 mL | Freq: Once | INTRAVENOUS | Status: AC
  Administered 2023-07-16: 1000 mL via INTRAVENOUS

## 2023-07-16 MED ORDER — CALCIUM GLUCONATE-NACL 1-0.675 GM/50ML-% IV SOLN
1.0000 g | Freq: Once | INTRAVENOUS | Status: AC
  Administered 2023-07-16: 1000 mg via INTRAVENOUS
  Filled 2023-07-16: qty 50

## 2023-07-16 MED ORDER — LORAZEPAM 2 MG/ML IJ SOLN
1.0000 mg | INTRAMUSCULAR | Status: DC | PRN
  Administered 2023-07-16: 3 mg via INTRAVENOUS
  Administered 2023-07-17 – 2023-07-18 (×8): 2 mg via INTRAVENOUS
  Administered 2023-07-18: 3 mg via INTRAVENOUS
  Administered 2023-07-18: 2 mg via INTRAVENOUS
  Administered 2023-07-18: 3 mg via INTRAVENOUS
  Administered 2023-07-18 (×2): 2 mg via INTRAVENOUS
  Administered 2023-07-18: 4 mg via INTRAVENOUS
  Administered 2023-07-18: 3 mg via INTRAVENOUS
  Administered 2023-07-18: 2 mg via INTRAVENOUS
  Administered 2023-07-18 (×2): 1 mg via INTRAVENOUS
  Filled 2023-07-16 (×2): qty 1
  Filled 2023-07-16: qty 2
  Filled 2023-07-16 (×4): qty 1
  Filled 2023-07-16: qty 2
  Filled 2023-07-16 (×2): qty 1
  Filled 2023-07-16: qty 2
  Filled 2023-07-16: qty 1
  Filled 2023-07-16: qty 2
  Filled 2023-07-16: qty 1
  Filled 2023-07-16: qty 2
  Filled 2023-07-16 (×4): qty 1

## 2023-07-16 MED ORDER — POTASSIUM CHLORIDE CRYS ER 20 MEQ PO TBCR
40.0000 meq | EXTENDED_RELEASE_TABLET | Freq: Once | ORAL | Status: DC
  Filled 2023-07-16: qty 2

## 2023-07-16 NOTE — Assessment & Plan Note (Signed)
-   Secondary to alcohol dependence - Also had recent CT imaging demonstrating severe fatty infiltrate of the liver

## 2023-07-16 NOTE — ED Provider Notes (Signed)
[place] EMERGENCY DEPARTMENT AT [facility] Provider Note   CSN: [id number] Arrival date & time: 07/16/23  1732     History  Chief Complaint  Patient presents with   Alcohol Intoxication    Gary [name] is a 37 y.o. male.  Patient is a 38 year old male with a history of seizures, schizophrenia, HIV, alcohol abuse with recurrent intoxication, pancreatitis and history of being unhoused who is presenting today with EMS with complaints of abdominal pain, nausea and vomiting.  Patient reports that he was trying to drink because it helps with the pain that he chronically has in his arms and legs but he states his body will not let him drink.  When he drinks alcohol he then vomits.  He also reports being hungry but every time he tries to eat he also vomits.  He denies any blood in his emesis.  He is not taking any medications right now.  He does report feeling generally weak and feels that he cannot protect himself because he lives around very dangerous and mean people.  He does report hitting the side of his face on the pavement and scraping it but has not been in any fights.  The history is provided by the patient and the EMS personnel.  Alcohol Intoxication       Home Medications Prior to Admission medications   Medication Sig Start Date End Date Taking? Authorizing Provider  bictegravir-emtricitabine-tenofovir AF (BIKTARVY) 50-200-25 MG TABS tablet Take 1 tablet by mouth daily. 04/12/23 08/10/23  Vu, Tonita Phoenix, MD  levETIRAcetam (KEPPRA) 500 MG tablet Take 1 tablet (500 mg total) by mouth 2 (two) times daily. 05/21/23   Jacalyn Lefevre, MD  ondansetron (ZOFRAN-ODT) 8 MG disintegrating tablet Take 1 tablet by mouth every 8 hours as needed for nausea or vomiting. Patient not taking: Reported on 05/26/2023 05/21/23   Jacalyn Lefevre, MD  famotidine (PEPCID) 20 MG tablet Take 1 tablet (20 mg total) by mouth 2 (two) times daily. Patient not taking: Reported on 06/01/2019 05/14/19  06/02/19  Benjiman Core, MD      Allergies    Tegretol [carbamazepine] and Caffeine    Review of Systems   Review of Systems  Physical Exam Updated Vital Signs BP 115/84 (BP Location: Left Arm)   Pulse (!) 103   Temp 98.3 F (36.8 C) (Oral)   Resp 18   Ht 6' (1.829 m)   Wt 70 kg   SpO2 97%   BMI 20.93 kg/m  Physical Exam Vitals and nursing note reviewed.  Constitutional:      General: He is not in acute distress.    Appearance: He is well-developed.  HENT:     Head: Normocephalic and atraumatic.  Eyes:     Conjunctiva/sclera: Conjunctivae normal.     Pupils: Pupils are equal, round, and reactive to light.  Cardiovascular:     Rate and Rhythm: Normal rate and regular rhythm.     Heart sounds: No murmur heard. Pulmonary:     Effort: Pulmonary effort is normal. No respiratory distress.     Breath sounds: Normal breath sounds. No wheezing or rales.  Abdominal:     General: There is no distension.     Palpations: Abdomen is soft.     Tenderness: There is abdominal tenderness in the epigastric area and periumbilical area. There is no guarding or rebound.  Musculoskeletal:        General: No tenderness. Normal range of motion.     Cervical back:  Normal range of motion and neck supple.  Skin:    General: Skin is warm and dry.     Findings: No erythema or rash.  Neurological:     Mental Status: He is alert and oriented to person, place, and time.     Comments: Patient appears intoxicated but is able to carry on a conversation and move all extremities  Psychiatric:     Comments: Patient states his situation is bad he is feeling weak having these medical issues and states that he sometimes feels like he would take his life because this is not a good situation to live in     ED Results / Procedures / Treatments   Labs (all labs ordered are listed, but only abnormal results are displayed) Labs Reviewed  CBC WITH DIFFERENTIAL/PLATELET - Abnormal; Notable for the  following components:      Result Value   Platelets 120 (*)    All other components within normal limits  COMPREHENSIVE METABOLIC PANEL - Abnormal; Notable for the following components:   Sodium 132 (*)    Potassium 3.1 (*)    Chloride 97 (*)    Glucose, Bld 126 (*)    BUN <5 (*)    Creatinine, Ser 0.58 (*)    Calcium 7.7 (*)    Albumin 3.2 (*)    AST 92 (*)    ALT 49 (*)    All other components within normal limits  LIPASE, BLOOD - Abnormal; Notable for the following components:   Lipase 598 (*)    All other components within normal limits  ETHANOL - Abnormal; Notable for the following components:   Alcohol, Ethyl (B) 377 (*)    All other components within normal limits    EKG None  Radiology No results found.  Procedures Procedures    Medications Ordered in ED Medications  lactated ringers infusion (has no administration in time range)  LORazepam (ATIVAN) tablet 1-4 mg (has no administration in time range)    Or  LORazepam (ATIVAN) injection 1-4 mg (has no administration in time range)  thiamine (VITAMIN B1) tablet 100 mg (has no administration in time range)    Or  thiamine (VITAMIN B1) injection 100 mg (has no administration in time range)  folic acid (FOLVITE) tablet 1 mg (has no administration in time range)  multivitamin with minerals tablet 1 tablet (has no administration in time range)  lactated ringers bolus 1,000 mL (1,000 mLs Intravenous New Bag/Given 07/16/23 1854)  pantoprazole (PROTONIX) injection 40 mg (40 mg Intravenous Given 07/16/23 1821)  metoCLOPramide (REGLAN) injection 10 mg (10 mg Intravenous Given 07/16/23 1854)  alum & mag hydroxide-simeth (MAALOX/MYLANTA) 200-200-20 MG/5ML suspension 30 mL (30 mLs Oral Given 07/16/23 1819)    ED Course/ Medical Decision Making/ A&P                                 Medical Decision Making Amount and/or Complexity of Data Reviewed External Data Reviewed: notes. Labs: ordered. Decision-making details documented  in ED Course.  Risk OTC drugs. Prescription drug management. Decision regarding hospitalization.   Pt with multiple medical problems and comorbidities and presenting today with a complaint that caries a high risk for morbidity and mortality.  Here today with concern for alcohol intoxication, possible pancreatitis versus gastritis.  No history to suggest GI bleed at this time.  Patient has had emesis but there has been no blood in it.  He  does have epigastric and periumbilical abdominal pain but no lower abdominal pain concerning for appendicitis or diverticulitis.  Patient will be given IV fluids, Protonix, antiemetic.  He is requesting to eat.  Labs are pending.  8:19 PM I independently interpreted patient's labs and CBC with thrombocytopenia which is no different than prior, CMP with hypokalemia of 3.1, hyponatremia of 132, mildly elevated LFTs most likely from his ongoing alcohol use and a lipase today of almost 600.  EtOH of 377.  Patient has had pancreatitis in the past most recently in July with a CT that showed pancreatitis but no evidence of pseudocyst.  Patient is still having significant abdominal pain and is not tolerating p.o.'s.  Will continue IV fluids he was already given Protonix and will do further antiemetics.  Feel that he needs admission for pancreatitis.  Patient is at high risk for alcohol withdrawal and CIWA was also ordered.  Consulted hospitalist for admission.  Discussed this with the patient and he is comfortable with this plan.         Final Clinical Impression(s) / ED Diagnoses Final diagnoses:  Alcoholic intoxication without complication (HCC)  Homeless  Alcohol-induced acute pancreatitis without infection or necrosis    Rx / DC Orders ED Discharge Orders     None         Gwyneth Sprout, MD 07/16/23 2019

## 2023-07-16 NOTE — ED Triage Notes (Addendum)
Patient BIB [facility] from the street. Patient complaining of being lethargic after drinking alcohol. Patient drank 2 bottles of beer today. History of withdrawals seizures. Patient says "I fucking need help."

## 2023-07-16 NOTE — H&P (Signed)
History and Physical    Patient: Gary [name] ZOX:[id number] DOB: 24-May-1986 DOA: 07/16/2023 DOS: the patient was seen and examined on 07/17/2023 PCP: Patient, No Pcp Per  Patient coming from: Homeless  Chief Complaint:  Chief Complaint  Patient presents with   Alcohol Intoxication   HPI: [name] is a 37 y.o. male with medical history significant of HIV, polysubstance abuse, alcohol abuse with hx of withdrawal seizures and DT, depression, homelessness who presents with abdominal pain, nausea and vomiting.   Pt was lethargic after receiving IV ativan on my evaluation so unable to provide history. Hx obtain from ED physician and documentation. Pt has been drinking alcohol to help with chronic pain in his arms and legs but has been vomiting each time that he tries to drink. Also vomits with food. Has been feel weak.   He was last admitted 8/2 for alcohol intoxication, withdrawal and nausea. Treated with precedex, phenobarbital taper and as needed ativan. He was in the hospital for 5 days and was recovering but then left AMA.   In the ED, he was afebrile, normotensive on room air.   CBC without leukocytosis or anemia.  CMP notable for hyponatremia of 132, hypokalemia of 3.1, stable creatinine of 0.58 without anion gap.  Abnormal AST of 92, ALT of 49.  Alkaline phosphatase and total bilirubin within normal limits. Corrected calcium of 7.9.  Lipase of 598.  Alcohol level of 377  Patient was started on IV fluid infusion and placed on CIWA protocol in the ED.  Hospitalist then consulted for admission for acute alcoholic pancreatitis.    Review of Systems: pt unable to review full ROS due to lethargy Past Medical History:  Diagnosis Date   Alcohol abuse    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   Anxiety    Bipolar 2 disorder (HCC)    HIV (human immunodeficiency virus infection) (HCC)    Pancreatitis    PTSD (post-traumatic stress disorder)    Schizophrenia (HCC)     Seizures (HCC)    Subdural hematoma (HCC)    Past Surgical History:  Procedure Laterality Date   BIOPSY  04/19/2022   Procedure: BIOPSY;  Surgeon: Lemar Lofty., MD;  Location: [facility] ENDOSCOPY;  Service: Gastroenterology;;   ENTEROSCOPY N/A 04/19/2022   Procedure: ENTEROSCOPY;  Surgeon: Lemar Lofty., MD;  Location: [facility] ENDOSCOPY;  Service: Gastroenterology;  Laterality: N/A;   INCISION AND DRAINAGE PERIRECTAL ABSCESS N/A 09/24/2016   Procedure: IRRIGATION AND DEBRIDEMENT PERIRECTAL ABSCESS;  Surgeon: Ricarda Frame, MD;  Location: [facility] ORS;  Service: General;  Laterality: N/A;   none     Social History:  reports that he has been smoking cigarettes. He has never used smokeless tobacco. He reports current alcohol use of about 105.0 standard drinks of alcohol per week. He reports current drug use. Drug: Methamphetamines.  Allergies  Allergen Reactions   Tegretol [Carbamazepine] Other (See Comments)    Caused vertigo for 2 days after taking it   Caffeine Palpitations    Family History  Problem Relation Age of Onset   Alcohol abuse Mother    Alcohol abuse Father    Colon cancer Other    Other Other    Cancer Other     Prior to Admission medications   Medication Sig Start Date End Date Taking? Authorizing Provider  bictegravir-emtricitabine-tenofovir AF (BIKTARVY) 50-200-25 MG TABS tablet Take 1 tablet by mouth daily. 04/12/23 08/10/23  Vu, Tonita Phoenix, MD  levETIRAcetam (KEPPRA) 500 MG tablet Take 1 tablet (500 mg  total) by mouth 2 (two) times daily. 05/21/23   Jacalyn Lefevre, MD  ondansetron (ZOFRAN-ODT) 8 MG disintegrating tablet Take 1 tablet by mouth every 8 hours as needed for nausea or vomiting. Patient not taking: Reported on 05/26/2023 05/21/23   Jacalyn Lefevre, MD  famotidine (PEPCID) 20 MG tablet Take 1 tablet (20 mg total) by mouth 2 (two) times daily. Patient not taking: Reported on 06/01/2019 05/14/19 06/02/19  Benjiman Core, MD    Physical Exam: Vitals:    07/16/23 2215 07/16/23 2330 07/17/23 0000 07/17/23 0030  BP: 109/66 (!) 120/94 (!) 125/92 126/82  Pulse: 84 95 99 97  Resp:  18 20 (!) 21  Temp:      TempSrc:      SpO2: 92% 96% 99% 96%  Weight:      Height:       Constitutional: NAD, lethargic middle age male laying upright in bed with facial flushing. Responsive only to noxious stimuli after receiving IV ativan.  Eyes: lids and conjunctivae normal ENMT: Mucous membranes are moist.  Neck: normal, supple Respiratory: clear to auscultation bilaterally, no wheezing, no crackles. Normal respiratory effort. No accessory muscle use. On room air.  Cardiovascular: Regular rate and rhythm, no murmurs / rubs / gallops. No extremity edema.  Abdomen: soft, no grimace with palpation  Musculoskeletal: no clubbing / cyanosis. No joint deformity upper and lower extremities. Normal muscle tone.  Skin: a few healing ulceration noted to bilateral hand  Neurologic: lethargic and responsive only to noxious stimuli  Psychiatric: unable to assess with lethargy  Data Reviewed:  See HPI  Assessment and Plan: * Acute alcoholic pancreatitis Lipase of 598 - Continue aggressive IV fluid hydration - PRN narcotics for pain    Alcohol use disorder, severe, dependence (HCC) -pt presented with intoxication, withdrawal and nausea. Last admitted for the same in August -ETOH level of 377 -CIWA protocol started   History of seizures -Unclear if he is still taking antiepileptics   Hypocalcemia - Replete with IV calcium - Check magnesium level  Abnormal LFTs - Secondary to alcohol dependence - Also had recent CT imaging demonstrating severe fatty infiltrate of the liver  Hypokalemia - Replete with potassium IV supplement  HIV disease (HCC) - Check current viral load and CD4 - Viral load was undetectable in 03/2023, CD4 of 253 in 12/2022 - unclear if he is currently on antiviral       Advance Care Planning:   Code Status: Full Code   Consults:  NONE  Family Communication: none at bedside  Severity of Illness: The appropriate patient status for this patient is INPATIENT. Inpatient status is judged to be reasonable and necessary in order to provide the required intensity of service to ensure the patient's safety. The patient's presenting symptoms, physical exam findings, and initial radiographic and laboratory data in the context of their chronic comorbidities is felt to place them at high risk for further clinical deterioration. Furthermore, it is not anticipated that the patient will be medically stable for discharge from the hospital within 2 midnights of admission.   * I certify that at the point of admission it is my clinical judgment that the patient will require inpatient hospital care spanning beyond 2 midnights from the point of admission due to high intensity of service, high risk for further deterioration and high frequency of surveillance required.*  Author: Anselm Jungling, DO 07/17/2023 12:35 AM  For on call review [email or web address].

## 2023-07-16 NOTE — Assessment & Plan Note (Signed)
-   Replete with IV calcium - Check magnesium level

## 2023-07-16 NOTE — Assessment & Plan Note (Signed)
Lipase of 598 - Continue aggressive IV fluid hydration - PRN narcotics for pain

## 2023-07-16 NOTE — ED Notes (Signed)
Pt currently resting at this time. VSS and NAD noted. Will continue to monitor.

## 2023-07-16 NOTE — Assessment & Plan Note (Signed)
-   Replete with potassium oral supplement

## 2023-07-16 NOTE — Assessment & Plan Note (Signed)
-   Check current viral load and CD4 - Viral load was undetectable in 03/2023, CD4 of 253 in 12/2022 - unclear if he is currently on antiviral

## 2023-07-17 DIAGNOSIS — E876 Hypokalemia: Secondary | ICD-10-CM

## 2023-07-17 DIAGNOSIS — B20 Human immunodeficiency virus [HIV] disease: Secondary | ICD-10-CM

## 2023-07-17 DIAGNOSIS — R7989 Other specified abnormal findings of blood chemistry: Secondary | ICD-10-CM

## 2023-07-17 DIAGNOSIS — F10930 Alcohol use, unspecified with withdrawal, uncomplicated: Secondary | ICD-10-CM

## 2023-07-17 DIAGNOSIS — Z87898 Personal history of other specified conditions: Secondary | ICD-10-CM

## 2023-07-17 DIAGNOSIS — K852 Alcohol induced acute pancreatitis without necrosis or infection: Secondary | ICD-10-CM

## 2023-07-17 LAB — BASIC METABOLIC PANEL
Anion gap: 13 (ref 5–15)
BUN: <5 mg/dL — ABNORMAL LOW (ref 6–20)
CO2: 23 mmol/L (ref 22–32)
Calcium: 7.8 mg/dL — ABNORMAL LOW (ref 8.9–10.3)
Chloride: 98 mmol/L (ref 98–111)
Creatinine, Ser: 0.51 mg/dL — ABNORMAL LOW (ref 0.61–1.24)
GFR, Estimated: >60 mL/min (ref >60)
Glucose, Bld: 84 mg/dL (ref 70–99)
Potassium: 3.4 mmol/L — ABNORMAL LOW (ref 3.5–5.1)
Sodium: 134 mmol/L — ABNORMAL LOW (ref 135–145)

## 2023-07-17 LAB — CBC
HCT: 36.9 % — ABNORMAL LOW (ref 39.0–52.0)
Hemoglobin: 12.1 g/dL — ABNORMAL LOW (ref 13.0–17.0)
MCH: 31 pg (ref 26.0–34.0)
MCHC: 32.8 g/dL (ref 30.0–36.0)
MCV: 94.6 fL (ref 80.0–100.0)
Platelets: 99 10*3/uL — ABNORMAL LOW (ref 150–400)
RBC: 3.9 MIL/uL — ABNORMAL LOW (ref 4.22–5.81)
RDW: 15.1 % (ref 11.5–15.5)
WBC: 3.7 10*3/uL — ABNORMAL LOW (ref 4.0–10.5)
nRBC: 0 % (ref 0.0–0.2)

## 2023-07-17 LAB — MAGNESIUM: Magnesium: 1.6 mg/dL — ABNORMAL LOW (ref 1.7–2.4)

## 2023-07-17 LAB — LIPASE, BLOOD: Lipase: 349 U/L — ABNORMAL HIGH (ref 11–51)

## 2023-07-17 LAB — MRSA NEXT GEN BY PCR, NASAL: MRSA by PCR Next Gen: NOT DETECTED

## 2023-07-17 MED ORDER — MAGNESIUM SULFATE 2 GM/50ML IV SOLN
2.0000 g | Freq: Once | INTRAVENOUS | Status: AC
  Administered 2023-07-17: 2 g via INTRAVENOUS
  Filled 2023-07-17: qty 50

## 2023-07-17 MED ORDER — ORAL CARE MOUTH RINSE: 15.0000 mL | OROMUCOSAL | Status: DC | PRN

## 2023-07-17 MED ORDER — PANTOPRAZOLE SODIUM 40 MG IV SOLR
40.0000 mg | Freq: Once | INTRAVENOUS | Status: AC
  Administered 2023-07-17: 40 mg via INTRAVENOUS
  Filled 2023-07-17: qty 10

## 2023-07-17 MED ORDER — LEVETIRACETAM 500 MG PO TABS
500.0000 mg | ORAL_TABLET | Freq: Two times a day (BID) | ORAL | Status: DC
  Administered 2023-07-17 – 2023-07-18 (×2): 500 mg via ORAL
  Filled 2023-07-17 (×2): qty 1

## 2023-07-17 MED ORDER — BICTEGRAVIR-EMTRICITAB-TENOFOV 50-200-25 MG PO TABS
1.0000 | ORAL_TABLET | Freq: Every day | ORAL | Status: DC
  Administered 2023-07-17 – 2023-07-18 (×2): 1 via ORAL
  Filled 2023-07-17 (×2): qty 1

## 2023-07-17 MED ORDER — ENSURE ENLIVE PO LIQD
237.0000 mL | Freq: Two times a day (BID) | ORAL | Status: DC
  Administered 2023-07-18: 237 mL via ORAL

## 2023-07-17 MED ORDER — CHLORHEXIDINE GLUCONATE CLOTH 2 % EX PADS
6.0000 | MEDICATED_PAD | Freq: Every day | CUTANEOUS | Status: DC
  Administered 2023-07-17: 6 via TOPICAL

## 2023-07-17 MED ORDER — MORPHINE SULFATE (PF) 2 MG/ML IV SOLN
1.0000 mg | INTRAVENOUS | Status: DC | PRN
  Administered 2023-07-17 (×3): 1 mg via INTRAVENOUS
  Filled 2023-07-17 (×3): qty 1

## 2023-07-17 MED ORDER — POTASSIUM CHLORIDE 10 MEQ/100ML IV SOLN
10.0000 meq | INTRAVENOUS | Status: AC
  Administered 2023-07-17: 10 meq via INTRAVENOUS
  Filled 2023-07-17 (×2): qty 100

## 2023-07-17 NOTE — ED Notes (Signed)
ED TO INPATIENT HANDOFF REPORT  ED Nurse Name and Phone #: Crist Infante, RN [phone]  S Name/Age/Gender Gary [name] 37 y.o. male Room/Bed: WA15/WA15  Code Status   Code Status: Full Code  Home/SNF/Other Homeless Patient oriented to: self, place, time, and situation Is this baseline? Yes   Triage Complete: Triage complete  Chief Complaint Acute alcoholic pancreatitis [K85.20]  Triage Note Patient BIB [facility] from the street. Patient complaining of being lethargic after drinking alcohol. Patient drank 2 bottles of beer today. History of withdrawals seizures. Patient says "I fucking need help."   Allergies Allergies  Allergen Reactions   Tegretol [Carbamazepine] Other (See Comments)    Caused vertigo for 2 days after taking it   Caffeine Palpitations    Level of Care/Admitting Diagnosis ED Disposition     ED Disposition  Admit   Condition  --   Comment  Hospital Area: [facility] [[id number]]  Level of Care: Stepdown [14]  Admit to SDU based on following criteria: Severe physiological/psychological symptoms:  Any diagnosis requiring assessment & intervention at least every 4 hours on an ongoing basis to obtain desired patient outcomes including stability and rehabilitation  May admit patient to [name] or [facility] if equivalent level of care is available:: No  Covid Evaluation: Asymptomatic - no recent exposure (last 10 days) testing not required  Diagnosis: Acute alcoholic pancreatitis [951884]  Admitting Physician: Anselm Jungling [[id number]]  Attending Physician: Anselm Jungling [[id number]]  Certification:: I certify this patient will need inpatient services for at least 2 midnights  Expected Medical Readiness: 07/18/2023          B Medical/Surgery History Past Medical History:  Diagnosis Date   Alcohol abuse    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   Anxiety    Bipolar 2 disorder (HCC)    HIV (human immunodeficiency virus infection) (HCC)     Pancreatitis    PTSD (post-traumatic stress disorder)    Schizophrenia (HCC)    Seizures (HCC)    Subdural hematoma (HCC)    Past Surgical History:  Procedure Laterality Date   BIOPSY  04/19/2022   Procedure: BIOPSY;  Surgeon: Lemar Lofty., MD;  Location: [facility] ENDOSCOPY;  Service: Gastroenterology;;   ENTEROSCOPY N/A 04/19/2022   Procedure: ENTEROSCOPY;  Surgeon: Lemar Lofty., MD;  Location: [facility] ENDOSCOPY;  Service: Gastroenterology;  Laterality: N/A;   INCISION AND DRAINAGE PERIRECTAL ABSCESS N/A 09/24/2016   Procedure: IRRIGATION AND DEBRIDEMENT PERIRECTAL ABSCESS;  Surgeon: Ricarda Frame, MD;  Location: [facility] ORS;  Service: General;  Laterality: N/A;   none       A IV Location/Drains/Wounds Patient Lines/Drains/Airways Status     Active Line/Drains/Airways     Name Placement date Placement time Site Days   Peripheral IV 07/16/23 22 G Distal;Left Wrist 07/16/23  1848  Wrist  1   Peripheral IV 07/16/23 20 G 1" Anterior;Distal;Right;Upper Arm 07/16/23  2325  Arm  1            Intake/Output Last 24 hours  Intake/Output Summary (Last 24 hours) at 07/17/2023 0854 Last data filed at 07/17/2023 0607 Gross per 24 hour  Intake 1236.38 ml  Output --  Net 1236.38 ml    Labs/Imaging Results for orders placed or performed during the hospital encounter of 07/16/23 (from the past 48 hour(s))  CBC with Differential/Platelet     Status: Abnormal   Collection Time: 07/16/23  6:38 PM  Result Value Ref Range   WBC 5.6 4.0 - 10.5 K/uL  RBC 4.22 4.22 - 5.81 MIL/uL   Hemoglobin 13.5 13.0 - 17.0 g/dL   HCT 40.9 81.1 - 91.4 %   MCV 95.0 80.0 - 100.0 fL   MCH 32.0 26.0 - 34.0 pg   MCHC 33.7 30.0 - 36.0 g/dL   RDW 78.2 95.6 - 21.3 %   Platelets 120 (L) 150 - 400 K/uL    Comment: Immature Platelet Fraction may be clinically indicated, consider ordering this additional test [id number]    nRBC 0.0 0.0 - 0.2 %   Neutrophils Relative % 77 %   Neutro Abs 4.3 1.7 - 7.7  K/uL   Lymphocytes Relative 15 %   Lymphs Abs 0.8 0.7 - 4.0 K/uL   Monocytes Relative 6 %   Monocytes Absolute 0.3 0.1 - 1.0 K/uL   Eosinophils Relative 1 %   Eosinophils Absolute 0.0 0.0 - 0.5 K/uL   Basophils Relative 1 %   Basophils Absolute 0.0 0.0 - 0.1 K/uL   Immature Granulocytes 0 %   Abs Immature Granulocytes 0.01 0.00 - 0.07 K/uL    Comment: Performed at [facility], [address], [place], [place] [address]  Comprehensive metabolic panel     Status: Abnormal   Collection Time: 07/16/23  6:38 PM  Result Value Ref Range   Sodium 132 (L) 135 - 145 mmol/L   Potassium 3.1 (L) 3.5 - 5.1 mmol/L   Chloride 97 (L) 98 - 111 mmol/L   CO2 23 22 - 32 mmol/L   Glucose, Bld 126 (H) 70 - 99 mg/dL    Comment: Glucose reference range applies only to samples taken after fasting for at least 8 hours.   BUN <5 (L) 6 - 20 mg/dL   Creatinine, Ser 2.84 (L) 0.61 - 1.24 mg/dL   Calcium 7.7 (L) 8.9 - 10.3 mg/dL   Total Protein 7.1 6.5 - 8.1 g/dL   Albumin 3.2 (L) 3.5 - 5.0 g/dL   AST 92 (H) 15 - 41 U/L   ALT 49 (H) 0 - 44 U/L   Alkaline Phosphatase 112 38 - 126 U/L   Total Bilirubin 0.7 0.3 - 1.2 mg/dL   GFR, Estimated >13 >24 mL/min    Comment: (NOTE) Calculated using the CKD-EPI Creatinine Equation (2021)    Anion gap 12 5 - 15    Comment: Performed at [facility], [address], [place], [place] [address]  Lipase, blood     Status: Abnormal   Collection Time: 07/16/23  6:38 PM  Result Value Ref Range   Lipase 598 (H) 11 - 51 U/L    Comment: RESULT CONFIRMED BY MANUAL DILUTION Performed at [facility], [address], [place], [place] [address]   Ethanol     Status: Abnormal   Collection Time: 07/16/23  6:38 PM  Result Value Ref Range   Alcohol, Ethyl (B) 377 (HH) <10 mg/dL    Comment: CRITICAL RESULT CALLED TO, READ BACK BY AND VERIFIED WITH Clois Dupes RN @ [phone] 07/16/23. GILBERTL (NOTE) Lowest detectable limit for serum  alcohol is 10 mg/dL.  For medical purposes only. Performed at [facility], [address], [place], [place] [address]   Magnesium     Status: Abnormal   Collection Time: 07/17/23 12:27 AM  Result Value Ref Range   Magnesium 1.6 (L) 1.7 - 2.4 mg/dL    Comment: Performed at [facility], [address], [place], [place] [address]  Lipase, blood  Status: Abnormal   Collection Time: 07/17/23  3:59 AM  Result Value Ref Range   Lipase 349 (H) 11 - 51 U/L    Comment: Performed at [facility], [address] [place], [place] [address]  CBC     Status: Abnormal   Collection Time: 07/17/23  3:59 AM  Result Value Ref Range   WBC 3.7 (L) 4.0 - 10.5 K/uL   RBC 3.90 (L) 4.22 - 5.81 MIL/uL   Hemoglobin 12.1 (L) 13.0 - 17.0 g/dL   HCT 60.4 (L) 54.0 - 98.1 %   MCV 94.6 80.0 - 100.0 fL   MCH 31.0 26.0 - 34.0 pg   MCHC 32.8 30.0 - 36.0 g/dL   RDW 19.1 47.8 - 29.5 %   Platelets 99 (L) 150 - 400 K/uL    Comment: SPECIMEN CHECKED FOR CLOTS Immature Platelet Fraction may be clinically indicated, consider ordering this additional test [id number] REPEATED TO VERIFY PLATELET COUNT CONFIRMED BY SMEAR    nRBC 0.0 0.0 - 0.2 %    Comment: Performed at [facility], [address], [place], [place] [address]  Basic metabolic panel     Status: Abnormal   Collection Time: 07/17/23  3:59 AM  Result Value Ref Range   Sodium 134 (L) 135 - 145 mmol/L   Potassium 3.4 (L) 3.5 - 5.1 mmol/L   Chloride 98 98 - 111 mmol/L   CO2 23 22 - 32 mmol/L   Glucose, Bld 84 70 - 99 mg/dL    Comment: Glucose reference range applies only to samples taken after fasting for at least 8 hours.   BUN <5 (L) 6 - 20 mg/dL   Creatinine, Ser 6.29 (L) 0.61 - 1.24 mg/dL   Calcium 7.8 (L) 8.9 - 10.3 mg/dL   GFR, Estimated >52 >84 mL/min    Comment: (NOTE) Calculated using the CKD-EPI Creatinine Equation (2021)    Anion gap 13 5 - 15    Comment:  Performed at [facility], [address] [place], [place] [address]   No results found.  Pending Labs Unresulted Labs (From admission, onward)     Start     Ordered   07/17/23 0500  T-helper cells (CD4) count (not at [facility])  Tomorrow morning,   R        07/16/23 2228   07/17/23 0500  HIV-1 RNA quant-no reflex-bld  Tomorrow morning,   R        07/16/23 2228            Vitals/Pain Today's Vitals   07/17/23 0726 07/17/23 0748 07/17/23 0800 07/17/23 0818  BP: 124/89  120/83   Pulse: (!) 107  (!) 103   Resp: (!) 21  19   Temp:  99.8 F (37.7 C)    TempSrc:  Oral    SpO2: 95%  97%   Weight:      Height:      PainSc:    4     Isolation Precautions No active isolations  Medications Medications  lactated ringers infusion ( Intravenous New Bag/Given 07/17/23 0410)  LORazepam (ATIVAN) tablet 1-4 mg ( Oral See Alternative 07/17/23 0739)    Or  LORazepam (ATIVAN) injection 1-4 mg (2 mg Intravenous Given 07/17/23 0739)  thiamine (VITAMIN B1) tablet 100 mg (100 mg Oral Given 07/16/23 2129)    Or  thiamine (VITAMIN B1) injection 100 mg ( Intravenous See Alternative 07/16/23 2129)  folic acid (FOLVITE) tablet 1 mg (1 mg  Oral Given 07/16/23 2129)  multivitamin with minerals tablet 1 tablet (1 tablet Oral Given 07/16/23 2129)  promethazine (PHENERGAN) 12.5 mg in sodium chloride 0.9 % 50 mL IVPB (0 mg Intravenous Stopped 07/17/23 0607)  enoxaparin (LOVENOX) injection 40 mg (40 mg Subcutaneous Given 07/16/23 2327)  morphine (PF) 2 MG/ML injection 1 mg (has no administration in time range)  lactated ringers bolus 1,000 mL (0 mLs Intravenous Stopped 07/16/23 2115)  pantoprazole (PROTONIX) injection 40 mg (40 mg Intravenous Given 07/16/23 1821)  metoCLOPramide (REGLAN) injection 10 mg (10 mg Intravenous Given 07/16/23 1854)  alum & mag hydroxide-simeth (MAALOX/MYLANTA) 200-200-20 MG/5ML suspension 30 mL (30 mLs Oral Given 07/16/23 1819)  calcium gluconate 1 g/ 50 mL sodium chloride IVPB (0  mg Intravenous Stopped 07/17/23 0031)  potassium chloride 10 mEq in 100 mL IVPB (0 mEq Intravenous Stopped 07/17/23 0357)    Mobility walks     Focused Assessments Neuro Assessment Handoff:  Swallow screen pass?  N/A         Neuro Assessment:   Neuro Checks:      Has TPA been given? No If patient is a Neuro Trauma and patient is going to OR before floor call report to 4N Charge nurse: [phone] or [phone]   R Recommendations: See Admitting Provider Note  Report given to:   Additional Notes:

## 2023-07-17 NOTE — Progress Notes (Signed)
[facility]                                                                               Gary [name], is a 37 y.o. male, DOB - November 21, 1985, QIO:[id number] Admit date - 07/16/2023    Outpatient Primary MD for the patient is Patient, No Pcp Per  LOS - 1  days    Brief summary   37 y.o. male with medical history significant of HIV, polysubstance abuse, alcohol abuse with hx of withdrawal seizures and DT, depression, homelessness who presents with abdominal pain, nausea and vomiting.    Assessment & Plan    Assessment and Plan: * Acute alcoholic pancreatitis Lipase of 598 - Continue aggressive IV fluid hydration - PRN narcotics for pain  -   Alcohol use disorder, severe, dependence (HCC) -pt presented with intoxication, withdrawal and nausea. Last admitted for the same in August On CIWA Protocol.   History of seizures -Unclear if he is still taking antiepileptics   Hypocalcemia Replaced.   Abnormal LFTs - Secondary to alcohol dependence - alcoholic hepatitis.   Hypokalemia - Replaced.   HIV disease (HCC) - Check current viral load and CD4 - Viral load was undetectable in 03/2023, CD4 of 253 in 12/2022 - unclear if he is currently on antiviral   Pancytopenia:  From ? HIV    Polysubstance abuse:  - positive for barbiturates and benzos.    Estimated body mass index is 20.93 kg/m as calculated from the following:   Height as of this encounter: 6' (1.829 m).   Weight as of this encounter: 70 kg.  Code Status: full code.  DVT Prophylaxis:  enoxaparin (LOVENOX) injection 40 mg Start: 07/16/23 2300   Level of Care: Level of care: Stepdown Family Communication: none at bedside.   Disposition Plan:     Remains inpatient appropriate:  tachycardic, in withdrawals.   Procedures:  None.   Consultants:   None.   Antimicrobials:   Anti-infectives (From admission, onward)    None        Medications  Scheduled Meds:  Chlorhexidine  Gluconate Cloth  6 each Topical Q2200   enoxaparin (LOVENOX) injection  40 mg Subcutaneous QHS   folic acid  1 mg Oral Daily   multivitamin with minerals  1 tablet Oral Daily   thiamine  100 mg Oral Daily   Or   thiamine  100 mg Intravenous Daily   Continuous Infusions:  lactated ringers 125 mL/hr at 07/17/23 1321   promethazine (PHENERGAN) injection (IM or IVPB) Stopped (07/17/23 1005)   PRN Meds:.LORazepam **OR** LORazepam, morphine injection, promethazine (PHENERGAN) injection (IM or IVPB)    Subjective:   Gary [name] was seen and examined today.  Very tremulous, nauseated.   Objective:   Vitals:   07/17/23 1115 07/17/23 1134 07/17/23 1300 07/17/23 1500  BP: 118/84  (!) 122/94 (!) 133/97  Pulse: (!) 108  (!) 102 (!) 120  Resp:   (!) 24 (!) 21  Temp:  99.8 F (37.7 C)    TempSrc:      SpO2:   98% 98%  Weight:      Height:  Intake/Output Summary (Last 24 hours) at 07/17/2023 1606 Last data filed at 07/17/2023 1241 Gross per 24 hour  Intake 1432.25 ml  Output --  Net 1432.25 ml   Filed Weights   07/16/23 1741  Weight: 70 kg     Exam General exam: ill appearing disheveled gentleman tremulous Respiratory system: Clear to auscultation. Respiratory effort normal. Cardiovascular system: S1 & S2 heard,tachycardic, no JVD,  Gastrointestinal system: Abdomen is nondistended, soft and nontender.  Central nervous system: Alert and oriented.  Extremities: no pedal edema.  Skin: No rashes,  Psychiatry: Mood & affect appropriate.     Data Reviewed:  I have personally reviewed following labs and imaging studies   CBC Lab Results  Component Value Date   WBC 3.7 (L) 07/17/2023   RBC 3.90 (L) 07/17/2023   HGB 12.1 (L) 07/17/2023   HCT 36.9 (L) 07/17/2023   MCV 94.6 07/17/2023   MCH 31.0 07/17/2023   PLT 99 (L) 07/17/2023   MCHC 32.8 07/17/2023   RDW 15.1 07/17/2023   LYMPHSABS 0.8 07/16/2023   MONOABS 0.3 07/16/2023   EOSABS 0.0 07/16/2023   BASOSABS  0.0 07/16/2023     Last metabolic panel Lab Results  Component Value Date   NA 134 (L) 07/17/2023   K 3.4 (L) 07/17/2023   CL 98 07/17/2023   CO2 23 07/17/2023   BUN <5 (L) 07/17/2023   CREATININE 0.51 (L) 07/17/2023   GLUCOSE 84 07/17/2023   GFRNONAA >60 07/17/2023   GFRAA 124 03/20/2021   CALCIUM 7.8 (L) 07/17/2023   PHOS 4.1 06/15/2023   PROT 7.1 07/16/2023   ALBUMIN 3.2 (L) 07/16/2023   BILITOT 0.7 07/16/2023   ALKPHOS 112 07/16/2023   AST 92 (H) 07/16/2023   ALT 49 (H) 07/16/2023   ANIONGAP 13 07/17/2023    CBG (last 3)  No results for input(s): "GLUCAP" in the last 72 hours.    Coagulation Profile: No results for input(s): "INR", "PROTIME" in the last 168 hours.   Radiology Studies: No results found.     Kathlen Mody M.D. [facility] 07/17/2023, 4:06 PM  Available via Epic secure chat 7am-7pm After 7 pm, please refer to night coverage provider listed on amion.

## 2023-07-17 NOTE — Plan of Care (Signed)
  Problem: Health Behavior/Discharge Planning: Goal: Ability to manage health-related needs will improve Outcome: Not Progressing   Problem: Nutrition: Goal: Adequate nutrition will be maintained Outcome: Not Progressing   

## 2023-07-17 NOTE — Assessment & Plan Note (Signed)
-  pt presented with intoxication, withdrawal and nausea. Last admitted for the same in August -ETOH level of 377 -CIWA protocol started

## 2023-07-17 NOTE — Assessment & Plan Note (Signed)
-  Unclear if he is still taking antiepileptics

## 2023-07-18 DIAGNOSIS — R7989 Other specified abnormal findings of blood chemistry: Secondary | ICD-10-CM | POA: Diagnosis not present

## 2023-07-18 DIAGNOSIS — F10930 Alcohol use, unspecified with withdrawal, uncomplicated: Secondary | ICD-10-CM | POA: Diagnosis not present

## 2023-07-18 DIAGNOSIS — Z87898 Personal history of other specified conditions: Secondary | ICD-10-CM | POA: Diagnosis not present

## 2023-07-18 DIAGNOSIS — K852 Alcohol induced acute pancreatitis without necrosis or infection: Secondary | ICD-10-CM | POA: Diagnosis not present

## 2023-07-18 LAB — CBC WITH DIFFERENTIAL/PLATELET
Abs Immature Granulocytes: 0.01 10*3/uL (ref 0.00–0.07)
Basophils Absolute: 0 10*3/uL (ref 0.0–0.1)
Basophils Relative: 1 %
Eosinophils Absolute: 0.1 10*3/uL (ref 0.0–0.5)
Eosinophils Relative: 2 %
HCT: 45.7 % (ref 39.0–52.0)
Hemoglobin: 15.1 g/dL (ref 13.0–17.0)
Immature Granulocytes: 0 %
Lymphocytes Relative: 23 %
Lymphs Abs: 0.8 10*3/uL (ref 0.7–4.0)
MCH: 32.1 pg (ref 26.0–34.0)
MCHC: 33 g/dL (ref 30.0–36.0)
MCV: 97.2 fL (ref 80.0–100.0)
Monocytes Absolute: 0.3 10*3/uL (ref 0.1–1.0)
Monocytes Relative: 10 %
Neutro Abs: 2.2 10*3/uL (ref 1.7–7.7)
Neutrophils Relative %: 64 %
Platelets: 83 10*3/uL — ABNORMAL LOW (ref 150–400)
RBC: 4.7 MIL/uL (ref 4.22–5.81)
RDW: 14.8 % (ref 11.5–15.5)
WBC: 3.3 10*3/uL — ABNORMAL LOW (ref 4.0–10.5)
nRBC: 0 % (ref 0.0–0.2)

## 2023-07-18 LAB — BASIC METABOLIC PANEL
Anion gap: 16 — ABNORMAL HIGH (ref 5–15)
BUN: <5 mg/dL — ABNORMAL LOW (ref 6–20)
CO2: 21 mmol/L — ABNORMAL LOW (ref 22–32)
Calcium: 9 mg/dL (ref 8.9–10.3)
Chloride: 92 mmol/L — ABNORMAL LOW (ref 98–111)
Creatinine, Ser: 0.5 mg/dL — ABNORMAL LOW (ref 0.61–1.24)
GFR, Estimated: >60 mL/min (ref >60)
Glucose, Bld: 76 mg/dL (ref 70–99)
Potassium: 3.9 mmol/L (ref 3.5–5.1)
Sodium: 129 mmol/L — ABNORMAL LOW (ref 135–145)

## 2023-07-18 LAB — HEPATIC FUNCTION PANEL
ALT: 33 U/L (ref 0–44)
AST: 45 U/L — ABNORMAL HIGH (ref 15–41)
Albumin: 3.2 g/dL — ABNORMAL LOW (ref 3.5–5.0)
Alkaline Phosphatase: 113 U/L (ref 38–126)
Bilirubin, Direct: 0.3 mg/dL — ABNORMAL HIGH (ref 0.0–0.2)
Indirect Bilirubin: 1.3 mg/dL — ABNORMAL HIGH (ref 0.3–0.9)
Total Bilirubin: 1.6 mg/dL — ABNORMAL HIGH (ref 0.3–1.2)
Total Protein: 7.6 g/dL (ref 6.5–8.1)

## 2023-07-18 LAB — MAGNESIUM: Magnesium: 1.9 mg/dL (ref 1.7–2.4)

## 2023-07-18 LAB — HIV-1 RNA QUANT-NO REFLEX-BLD
HIV 1 RNA Quant: 52600 {copies}/mL
LOG10 HIV-1 RNA: 4.721 {Log_copies}/mL

## 2023-07-18 LAB — PHOSPHORUS: Phosphorus: 3.5 mg/dL (ref 2.5–4.6)

## 2023-07-18 LAB — LIPASE, BLOOD: Lipase: 454 U/L — ABNORMAL HIGH (ref 11–51)

## 2023-07-18 MED ORDER — SODIUM CHLORIDE 0.9 % IV SOLN: INTRAVENOUS | Status: DC

## 2023-07-18 NOTE — Progress Notes (Signed)
[facility]                                                                               Gary [name], is a 37 y.o. male, DOB - October 16, 1986, ZOX:[id number] Admit date - 07/16/2023    Outpatient Primary MD for the patient is Patient, No Pcp Per  LOS - 2  days    Brief summary   37 y.o. male with medical history significant of HIV, polysubstance abuse, alcohol abuse with hx of withdrawal seizures and DT, depression, homelessness who presents with abdominal pain, nausea and vomiting.    Assessment & Plan    Assessment and Plan: * Acute alcoholic pancreatitis Lipase of 598 - Continue aggressive IV fluid hydration - PRN narcotics for pain  - repeat lipase is around 454   Alcohol use disorder, severe, dependence (HCC) -pt presented with intoxication, withdrawal and nausea. Last admitted for the same in August On CIWA Protocol. Improving enzymes.    History of seizures -restarted him on keppra.   Hypocalcemia Replaced.   Abnormal LFTs - Secondary to alcohol dependence - alcoholic hepatitis.  - Improving.   Hypokalemia - Replaced.   HIV disease (HCC) - Check current viral load and CD4 - Viral load was undetectable in 03/2023, CD4 of 253 in 12/2022 - unclear if he is currently on antiviral   Pancytopenia:  From ? HIV    Polysubstance abuse:  - positive for barbiturates and benzos.    Hyponatremia:  Sodium of 129.  Possibly from RL fluids.    Hypomagnesemia Replaced.   Estimated body mass index is 19.58 kg/m as calculated from the following:   Height as of this encounter: 6' (1.829 m).   Weight as of this encounter: 65.5 kg.  Code Status: full code.  DVT Prophylaxis:  enoxaparin (LOVENOX) injection 40 mg Start: 07/16/23 2300   Level of Care: Level of care: Stepdown Family Communication: none at bedside.   Disposition Plan:     Remains inpatient appropriate:  tachycardic, in withdrawals.   Procedures:  None.   Consultants:    None.   Antimicrobials:   Anti-infectives (From admission, onward)    Start     Dose/Rate Route Frequency Ordered Stop   07/17/23 2200  bictegravir-emtricitabine-tenofovir AF (BIKTARVY) 50-200-25 MG per tablet 1 tablet        1 tablet Oral Daily 07/17/23 1652          Medications  Scheduled Meds:  bictegravir-emtricitabine-tenofovir AF  1 tablet Oral Daily   Chlorhexidine Gluconate Cloth  6 each Topical Q2200   enoxaparin (LOVENOX) injection  40 mg Subcutaneous QHS   feeding supplement  237 mL Oral BID BM   folic acid  1 mg Oral Daily   levETIRAcetam  500 mg Oral BID   multivitamin with minerals  1 tablet Oral Daily   thiamine  100 mg Oral Daily   Or   thiamine  100 mg Intravenous Daily   Continuous Infusions:  lactated ringers 125 mL/hr at 07/18/23 0723   promethazine (PHENERGAN) injection (IM or IVPB) Stopped (07/17/23 2215)   PRN Meds:.LORazepam **OR** LORazepam, morphine injection, mouth rinse, promethazine (PHENERGAN) injection (IM or IVPB)  Subjective:   Gary [name] was seen and examined today.  Pt reports pain not well controlled. Still receiving IV ativan.   Objective:   Vitals:   07/18/23 0310 07/18/23 0451 07/18/23 0700 07/18/23 0941  BP:  129/84 (!) 122/93   Pulse:   (!) 102   Resp:   17   Temp: 98.6 F (37 C)   97.8 F (36.6 C)  TempSrc: Oral   Oral  SpO2:   92%   Weight:      Height:        Intake/Output Summary (Last 24 hours) at 07/18/2023 1023 Last data filed at 07/18/2023 0135 Gross per 24 hour  Intake 3038.33 ml  Output 1525 ml  Net 1513.33 ml   Filed Weights   07/16/23 1741 07/17/23 1600  Weight: 70 kg 65.5 kg     Exam General exam: ill appearing gentleman, cachetic looking , not in distress.  Respiratory system: Clear to auscultation. Respiratory effort normal. Cardiovascular system: S1 & S2 heard, tachycardic.  Gastrointestinal system: Abdomen is soft, mild gen tenderness bs+ Central nervous system: sleepy but answering  questions appropriately.  Extremities: no edema.  Skin: No rashes,  Psychiatry: lethargic.     Data Reviewed:  I have personally reviewed following labs and imaging studies   CBC Lab Results  Component Value Date   WBC 3.7 (L) 07/17/2023   RBC 3.90 (L) 07/17/2023   HGB 12.1 (L) 07/17/2023   HCT 36.9 (L) 07/17/2023   MCV 94.6 07/17/2023   MCH 31.0 07/17/2023   PLT 99 (L) 07/17/2023   MCHC 32.8 07/17/2023   RDW 15.1 07/17/2023   LYMPHSABS 0.8 07/16/2023   MONOABS 0.3 07/16/2023   EOSABS 0.0 07/16/2023   BASOSABS 0.0 07/16/2023     Last metabolic panel Lab Results  Component Value Date   NA 134 (L) 07/17/2023   K 3.4 (L) 07/17/2023   CL 98 07/17/2023   CO2 23 07/17/2023   BUN <5 (L) 07/17/2023   CREATININE 0.51 (L) 07/17/2023   GLUCOSE 84 07/17/2023   GFRNONAA >60 07/17/2023   GFRAA 124 03/20/2021   CALCIUM 7.8 (L) 07/17/2023   PHOS 4.1 06/15/2023   PROT 7.1 07/16/2023   ALBUMIN 3.2 (L) 07/16/2023   BILITOT 0.7 07/16/2023   ALKPHOS 112 07/16/2023   AST 92 (H) 07/16/2023   ALT 49 (H) 07/16/2023   ANIONGAP 13 07/17/2023    CBG (last 3)  No results for input(s): "GLUCAP" in the last 72 hours.    Coagulation Profile: No results for input(s): "INR", "PROTIME" in the last 168 hours.   Radiology Studies: No results found.     Kathlen Mody M.D. [facility] 07/18/2023, 10:23 AM  Available via Epic secure chat 7am-7pm After 7 pm, please refer to night coverage provider listed on amion.

## 2023-07-18 NOTE — Plan of Care (Signed)
  Problem: Clinical Measurements: Goal: Will remain free from infection Outcome: Progressing Goal: Diagnostic test results will improve Outcome: Progressing Goal: Respiratory complications will improve Outcome: Progressing Goal: Cardiovascular complication will be avoided Outcome: Progressing   Problem: Activity: Goal: Risk for activity intolerance will decrease Outcome: Progressing   Problem: Nutrition: Goal: Adequate nutrition will be maintained Outcome: Progressing   Problem: Coping: Goal: Level of anxiety will decrease Outcome: Progressing   Problem: Pain Managment: Goal: General experience of comfort will improve Outcome: Progressing   Problem: Safety: Goal: Ability to remain free from injury will improve Outcome: Progressing

## 2023-07-18 NOTE — Progress Notes (Signed)
Pt has become increasingly agitated, requesting to leave AMA and refusing morning labs. Jeris Penta., NP notified CIWA reassessed. New CIWA score:16. Following parameters in [facility] for ativan administration

## 2023-07-18 NOTE — TOC Progression Note (Signed)
Transition of Care [facility]) - Progression Note    Patient Details  Name: Gary [name] MRN: [id number] Date of Birth: [birth year]  Transition of Care [facility]) CM/SW Contact  Adrian Prows, RN Phone Number: 07/18/2023, 4:18 PM  Clinical Narrative:    Pt disoriented; no family at bedside; unable to complete TOC assessment.        Expected Discharge Plan and Services                                               Social Determinants of Health (SDOH) Interventions SDOH Screenings   Food Insecurity: Food Insecurity Present (07/17/2023)  Housing: High Risk (07/17/2023)  Transportation Needs: Unmet Transportation Needs (07/17/2023)  Utilities: Patient Declined (07/17/2023)  Alcohol Screen: High Risk (07/25/2021)  Depression (PHQ2-9): Medium Risk (02/12/2021)  Financial Resource Strain: High Risk (06/30/2022)   Received from [facility], [facility]  Social Connections: Unknown (06/24/2022)   Received from [facility], [facility]  Stress: No Stress Concern Present (06/30/2022)   Received from [facility], [facility]  Recent Concern: Stress - Stress Concern Present (06/25/2022)   Received from [facility]  Tobacco Use: High Risk (07/16/2023)    Readmission Risk Interventions    06/15/2023   10:18 AM 05/25/2023    1:34 PM 10/23/2022   11:31 AM  Readmission Risk Prevention Plan  Transportation Screening Complete Complete Complete  Medication Review Oceanographer) Complete Complete Complete  PCP or Specialist appointment within 3-5 days of discharge  Complete   HRI or Home Care Consult Complete Complete   SW Recovery Care/Counseling Consult Complete Complete   Palliative Care Screening Not Applicable Not Applicable   Skilled Nursing Facility Not Applicable Not Applicable

## 2023-07-18 NOTE — Progress Notes (Signed)
Pt stated "[facility] doesn't know how to treat a man that's dying Im going to leave."  This RN informed pt of risks of leaving against medical advice which could include disability and/or death. Pt stated he understood risks and signed  the leaving AMA paperwork.  Informed Dr. Blake Divine that pt is leaving AMA. Erick Blinks, RN

## 2023-07-19 LAB — T-HELPER CELLS (CD4) COUNT (NOT AT ARMC)
CD4 % Helper T Cell: 38 % (ref 33–65)
CD4 T Cell Abs: 248 /uL — ABNORMAL LOW (ref 400–1790)

## 2023-07-19 LAB — HIV-1 RNA QUANT-NO REFLEX-BLD
HIV 1 RNA Quant: 47000 {copies}/mL
LOG10 HIV-1 RNA: 4.672 {Log_copies}/mL

## 2023-07-25 NOTE — Discharge Summary (Signed)
Physician Discharge Summary   Patient: Gary [name] MRN: [id number] DOB: [birth year]  Admit date:     07/16/2023  Discharge date: 07/18/2023  Discharge Physician: Kathlen Mody   PCP: Patient, No Pcp Per   Recommendations at discharge:  Pt left AMA.   Discharge Diagnoses: Principal Problem:   Acute alcoholic pancreatitis Active Problems:   Alcohol use disorder, severe, dependence (HCC)   History of seizures   Hypocalcemia   HIV disease (HCC)   Hypokalemia   Abnormal LFTs   Alcohol withdrawal (HCC)  Resolved Problems:   * No resolved hospital problems. *  Hospital Course: 37 y.o. male with medical history significant of HIV, polysubstance abuse, alcohol abuse with hx of withdrawal seizures and DT, depression, homelessness who presents with abdominal pain, nausea and vomiting.   Assessment and Plan:   Acute alcoholic pancreatitis Lipase of 598 - Continue aggressive IV fluid hydration - PRN narcotics for pain  - repeat lipase is around 454 - STILL pt in pain, left AMA.      Alcohol use disorder, severe, dependence (HCC) -pt presented with intoxication, withdrawal and nausea. Last admitted for the same in August On CIWA Protocol. Improving enzymes.      History of seizures -restarted him on keppra.    Hypocalcemia Replaced.    Abnormal LFTs - Secondary to alcohol dependence - alcoholic hepatitis.  - Improving.    Hypokalemia - Replaced.    HIV disease (HCC) - Check current viral load and CD4 - Viral load was undetectable in 03/2023, CD4 of 253 in 12/2022 - unclear if he is currently on antiviral    Pancytopenia:  From ? HIV      Polysubstance abuse:  - positive for barbiturates and benzos.      Hyponatremia:  Sodium of 129.  Possibly from RL fluids.      Hypomagnesemia Replaced.    DISCHARGE MEDICATION: Allergies as of 07/18/2023       Reactions   Tegretol [carbamazepine] Other (See Comments)   Caused vertigo for 2 days after taking it    Caffeine Palpitations        Medication List     ASK your doctor about these medications    bictegravir-emtricitabine-tenofovir AF 50-200-25 MG Tabs tablet Commonly known as: BIKTARVY Take 1 tablet by mouth daily.   levETIRAcetam 500 MG tablet Commonly known as: KEPPRA Take 1 tablet (500 mg total) by mouth 2 (two) times daily.   ondansetron 8 MG disintegrating tablet Commonly known as: ZOFRAN-ODT Take 1 tablet by mouth every 8 hours as needed for nausea or vomiting.        Discharge Exam: Filed Weights   07/16/23 1741 07/17/23 1600  Weight: 70 kg 65.5 kg     The results of significant diagnostics from this hospitalization (including imaging, microbiology, ancillary and laboratory) are listed below for reference.   Imaging Studies: No results found.  Microbiology: Results for orders placed or performed during the hospital encounter of 07/16/23  MRSA Next Gen by PCR, Nasal     Status: None   Collection Time: 07/17/23  4:14 PM   Specimen: Nasal Mucosa; Nasal Swab  Result Value Ref Range Status   MRSA by PCR Next Gen NOT DETECTED NOT DETECTED Final    Comment: (NOTE) The GeneXpert MRSA Assay (FDA approved for NASAL specimens only), is one component of a comprehensive MRSA colonization surveillance program. It is not intended to diagnose MRSA infection nor to guide or monitor treatment for MRSA infections. Test performance  is not FDA approved in patients less than 58 years old. Performed at [facility], [address], [place], [place] [address]     Labs: CBC: Recent Labs  Lab 07/18/23 1101  WBC 3.3*  NEUTROABS 2.2  HGB 15.1  HCT 45.7  MCV 97.2  PLT 83*   Basic Metabolic Panel: Recent Labs  Lab 07/18/23 1101  NA 129*  K 3.9  CL 92*  CO2 21*  GLUCOSE 76  BUN <5*  CREATININE 0.50*  CALCIUM 9.0  MG 1.9  PHOS 3.5   Liver Function Tests: Recent Labs  Lab 07/18/23 1101  AST 45*  ALT 33  ALKPHOS 113  BILITOT 1.6*  PROT  7.6  ALBUMIN 3.2*   CBG: No results for input(s): "GLUCAP" in the last 168 hours.    Signed: Kathlen Mody, MD [facility]

## 2023-07-30 ENCOUNTER — Emergency Department (HOSPITAL_COMMUNITY)
Admission: EM | Admit: 2023-07-30 | Discharge: 2023-07-31 | Disposition: A | Discharge status: Home / Self Care | Payer: MEDICAID | Attending: Emergency Medicine | Admitting: Emergency Medicine

## 2023-07-30 ENCOUNTER — Other Ambulatory Visit: Payer: Self-pay

## 2023-07-30 DIAGNOSIS — F10930 Alcohol use, unspecified with withdrawal, uncomplicated: Secondary | ICD-10-CM

## 2023-07-30 DIAGNOSIS — Y908 Blood alcohol level of 240 mg/100 ml or more: Secondary | ICD-10-CM | POA: Diagnosis not present

## 2023-07-30 DIAGNOSIS — R569 Unspecified convulsions: Secondary | ICD-10-CM | POA: Insufficient documentation

## 2023-07-30 DIAGNOSIS — R7401 Elevation of levels of liver transaminase levels: Secondary | ICD-10-CM | POA: Diagnosis not present

## 2023-07-30 DIAGNOSIS — E162 Hypoglycemia, unspecified: Secondary | ICD-10-CM

## 2023-07-30 DIAGNOSIS — R110 Nausea: Secondary | ICD-10-CM | POA: Insufficient documentation

## 2023-07-30 DIAGNOSIS — F10230 Alcohol dependence with withdrawal, uncomplicated: Secondary | ICD-10-CM | POA: Insufficient documentation

## 2023-07-30 DIAGNOSIS — Z21 Asymptomatic human immunodeficiency virus [HIV] infection status: Secondary | ICD-10-CM | POA: Insufficient documentation

## 2023-07-30 DIAGNOSIS — Z79899 Other long term (current) drug therapy: Secondary | ICD-10-CM | POA: Diagnosis not present

## 2023-07-30 LAB — COMPREHENSIVE METABOLIC PANEL
ALT: 107 U/L — ABNORMAL HIGH (ref 0–44)
AST: 164 U/L — ABNORMAL HIGH (ref 15–41)
Albumin: 3.5 g/dL (ref 3.5–5.0)
Alkaline Phosphatase: 95 U/L (ref 38–126)
Anion gap: 18 — ABNORMAL HIGH (ref 5–15)
BUN: 7 mg/dL (ref 6–20)
CO2: 19 mmol/L — ABNORMAL LOW (ref 22–32)
Calcium: 8 mg/dL — ABNORMAL LOW (ref 8.9–10.3)
Chloride: 99 mmol/L (ref 98–111)
Creatinine, Ser: 0.69 mg/dL (ref 0.61–1.24)
GFR, Estimated: >60 mL/min (ref >60)
Glucose, Bld: 59 mg/dL — ABNORMAL LOW (ref 70–99)
Potassium: 3.3 mmol/L — ABNORMAL LOW (ref 3.5–5.1)
Sodium: 136 mmol/L (ref 135–145)
Total Bilirubin: 1.1 mg/dL (ref 0.3–1.2)
Total Protein: 7.5 g/dL (ref 6.5–8.1)

## 2023-07-30 LAB — CBC
HCT: 40.8 % (ref 39.0–52.0)
Hemoglobin: 13.3 g/dL (ref 13.0–17.0)
MCH: 32.3 pg (ref 26.0–34.0)
MCHC: 32.6 g/dL (ref 30.0–36.0)
MCV: 99 fL (ref 80.0–100.0)
Platelets: 225 10*3/uL (ref 150–400)
RBC: 4.12 MIL/uL — ABNORMAL LOW (ref 4.22–5.81)
RDW: 15.3 % (ref 11.5–15.5)
WBC: 5.1 10*3/uL (ref 4.0–10.5)
nRBC: 0 % (ref 0.0–0.2)

## 2023-07-30 LAB — RAPID URINE DRUG SCREEN, HOSP PERFORMED
Amphetamines: NOT DETECTED
Barbiturates: NOT DETECTED
Benzodiazepines: NOT DETECTED
Cocaine: NOT DETECTED
Opiates: NOT DETECTED
Tetrahydrocannabinol: NOT DETECTED

## 2023-07-30 LAB — ETHANOL: Alcohol, Ethyl (B): 406 mg/dL (ref <10)

## 2023-07-30 NOTE — ED Provider Triage Note (Signed)
Emergency Medicine Provider Triage Evaluation Note  HJALMER DANOWSKI , a 37 y.o. male  was evaluated in triage.  Pt complains of "behavioral issues". Brought in with another patient. No specific complaints.   Said he would "raise hell" if he didn't get to go to the hospital with his friend.   Review of Systems  No complaints  Physical Exam  There were no vitals taken for this visit. Gen:   Awake, no distress   Resp:  Normal effort  MSK:   Moves extremities without difficulty  Other:    Medical Decision Making  Medically screening exam initiated at 6:43 PM.  Appropriate orders placed.  NATHANEIL BORCHERS was informed that the remainder of the evaluation will be completed by another provider, this initial triage assessment does not replace that evaluation, and the importance of remaining in the ED until their evaluation is complete.     Su Monks, PA-C 07/30/23 1843

## 2023-07-30 NOTE — ED Triage Notes (Signed)
Pt says he is here for alcoholism, seizure today (has not taken his keppra in about 3 weeks), and he thinks his right hand might be infected because he skinned his hand and the flies got on it. Last ETOH earlier today.

## 2023-07-31 ENCOUNTER — Other Ambulatory Visit (HOSPITAL_COMMUNITY): Payer: Self-pay

## 2023-07-31 LAB — MAGNESIUM: Magnesium: 1.8 mg/dL (ref 1.7–2.4)

## 2023-07-31 MED ORDER — SODIUM CHLORIDE 0.9 % IV BOLUS
1000.0000 mL | Freq: Once | INTRAVENOUS | Status: AC
  Administered 2023-07-31: 1000 mL via INTRAVENOUS

## 2023-07-31 MED ORDER — LORAZEPAM 1 MG PO TABS
0.0000 mg | ORAL_TABLET | Freq: Four times a day (QID) | ORAL | Status: DC
  Administered 2023-07-31: 1 mg via ORAL
  Filled 2023-07-31: qty 1

## 2023-07-31 MED ORDER — LORAZEPAM 1 MG PO TABS
2.0000 mg | ORAL_TABLET | Freq: Once | ORAL | Status: AC
  Administered 2023-07-31: 2 mg via ORAL
  Filled 2023-07-31: qty 2

## 2023-07-31 MED ORDER — LEVETIRACETAM IN NACL 1000 MG/100ML IV SOLN
1000.0000 mg | Freq: Once | INTRAVENOUS | Status: AC
  Administered 2023-07-31: 1000 mg via INTRAVENOUS
  Filled 2023-07-31: qty 100

## 2023-07-31 MED ORDER — LORAZEPAM 2 MG/ML IJ SOLN
INTRAMUSCULAR | Status: AC
  Administered 2023-07-31: 2 mg
  Filled 2023-07-31: qty 1

## 2023-07-31 MED ORDER — ONDANSETRON HCL 4 MG/2ML IJ SOLN
4.0000 mg | Freq: Once | INTRAMUSCULAR | Status: AC
  Administered 2023-07-31: 4 mg via INTRAVENOUS
  Filled 2023-07-31: qty 2

## 2023-07-31 MED ORDER — THIAMINE HCL 100 MG/ML IJ SOLN
100.0000 mg | Freq: Every day | INTRAMUSCULAR | Status: DC
  Filled 2023-07-31: qty 2

## 2023-07-31 MED ORDER — DOXYCYCLINE HYCLATE 100 MG PO TABS
100.0000 mg | ORAL_TABLET | Freq: Once | ORAL | Status: AC
  Administered 2023-07-31: 100 mg via ORAL
  Filled 2023-07-31: qty 1

## 2023-07-31 MED ORDER — POTASSIUM CHLORIDE CRYS ER 20 MEQ PO TBCR
40.0000 meq | EXTENDED_RELEASE_TABLET | Freq: Once | ORAL | Status: AC
  Administered 2023-07-31: 40 meq via ORAL
  Filled 2023-07-31: qty 2

## 2023-07-31 MED ORDER — MAGNESIUM SULFATE 2 GM/50ML IV SOLN
2.0000 g | Freq: Once | INTRAVENOUS | Status: AC
  Administered 2023-07-31: 2 g via INTRAVENOUS
  Filled 2023-07-31: qty 50

## 2023-07-31 MED ORDER — CHLORDIAZEPOXIDE HCL 25 MG PO CAPS
ORAL_CAPSULE | ORAL | 0 refills | Status: DC
  Filled 2023-07-31: qty 10, 2d supply, fill #0

## 2023-07-31 MED ORDER — LORAZEPAM 1 MG PO TABS
2.0000 mg | ORAL_TABLET | Freq: Four times a day (QID) | ORAL | Status: DC
  Administered 2023-07-31: 2 mg via ORAL
  Filled 2023-07-31: qty 2

## 2023-07-31 MED ORDER — THIAMINE MONONITRATE 100 MG PO TABS
100.0000 mg | ORAL_TABLET | Freq: Every day | ORAL | Status: DC
  Administered 2023-07-31 (×2): 100 mg via ORAL
  Filled 2023-07-31 (×2): qty 1

## 2023-07-31 MED ORDER — DEXTROSE 50 % IV SOLN
1.0000 | Freq: Once | INTRAVENOUS | Status: AC
  Administered 2023-07-31: 50 mL via INTRAVENOUS
  Filled 2023-07-31: qty 50

## 2023-07-31 NOTE — ED Notes (Signed)
No answer when called 

## 2023-07-31 NOTE — ED Provider Notes (Addendum)
Patient signed to me by Dr. Preston Fleeting.  Patient given Ativan here and his CIWA score is stable.  He is able to ambulate without assistance.  He appears to be clinically sober at this time.  His speech is not slurred and clear.  Will place on Librium taper and give outpatient resources for alcohol addiction   Lorre Nick, MD 07/31/23 1110    Lorre Nick, MD 07/31/23 1112

## 2023-07-31 NOTE — ED Provider Notes (Signed)
[place] EMERGENCY DEPARTMENT AT [facility] Provider Note   CSN: [id number] Arrival date & time: 07/30/23  1836     History  Chief Complaint  Patient presents with   Alcohol Problem    Gary [name] is a 37 y.o. male.  The history is provided by the patient.  Alcohol Problem  He has history of HIV infection, bipolar disorder, alcohol abuse, seizure disorder and comes in requesting assistance with alcohol abuse.  He drinks heavily and states his last drink was yesterday morning.  He also states that he has not had his levetiracetam in the last 3 weeks because when he was released from prison he neglected to get the prescription filled.  He did have a seizure prior to coming to the ED.  He is also concerned about MRSA infection in his right hand.  He is complaining of some nausea but no vomiting.  He denies other drug use.   Home Medications Prior to Admission medications   Medication Sig Start Date End Date Taking? Authorizing Provider  bictegravir-emtricitabine-tenofovir AF (BIKTARVY) 50-200-25 MG TABS tablet Take 1 tablet by mouth daily. Patient not taking: Reported on 07/17/2023 04/12/23 08/10/23  Rutha Bouchard T, MD  levETIRAcetam (KEPPRA) 500 MG tablet Take 1 tablet (500 mg total) by mouth 2 (two) times daily. Patient not taking: Reported on 07/17/2023 05/21/23   Jacalyn Lefevre, MD  ondansetron (ZOFRAN-ODT) 8 MG disintegrating tablet Take 1 tablet by mouth every 8 hours as needed for nausea or vomiting. Patient not taking: Reported on 05/26/2023 05/21/23   Jacalyn Lefevre, MD  famotidine (PEPCID) 20 MG tablet Take 1 tablet (20 mg total) by mouth 2 (two) times daily. Patient not taking: Reported on 06/01/2019 05/14/19 06/02/19  Benjiman Core, MD      Allergies    Tegretol [carbamazepine] and Caffeine    Review of Systems   Review of Systems  All other systems reviewed and are negative.   Physical Exam Updated Vital Signs BP 117/68 (BP Location: Left Arm)   Pulse 80    Temp 98.7 F (37.1 C) (Oral)   Resp 16   SpO2 96%  Physical Exam Vitals and nursing note reviewed.   37 year old male, resting comfortably and in no acute distress. Vital signs are normal. Oxygen saturation is 96%, which is normal. Head is normocephalic and atraumatic. PERRLA, EOMI. Oropharynx is clear. Neck is nontender and supple without adenopathy. Lungs are clear without rales, wheezes, or rhonchi. Chest is nontender. Heart has regular rate and rhythm without murmur. Abdomen is soft, flat, nontender. Extremities: There is an abrasion over the flexor surface of the right third finger with mild soft tissue swelling but no drainage and no lymphangitic streaks.  Shallow ulcers present on the radial aspect of the right wrist without erythema or swelling. Skin is warm and dry without rash. Neurologic: Mental status is normal, cranial nerves are intact, moves all extremities equally.  He is markedly tremulous.  ED Results / Procedures / Treatments   Labs (all labs ordered are listed, but only abnormal results are displayed) Labs Reviewed  COMPREHENSIVE METABOLIC PANEL - Abnormal; Notable for the following components:      Result Value   Potassium 3.3 (*)    CO2 19 (*)    Glucose, Bld 59 (*)    Calcium 8.0 (*)    AST 164 (*)    ALT 107 (*)    Anion gap 18 (*)    All other components within normal limits  ETHANOL - Abnormal; Notable for the following components:   Alcohol, Ethyl (B) 406 (*)    All other components within normal limits  CBC - Abnormal; Notable for the following components:   RBC 4.12 (*)    All other components within normal limits  RAPID URINE DRUG SCREEN, HOSP PERFORMED  MAGNESIUM   Procedures Procedures    Medications Ordered in ED Medications  LORazepam (ATIVAN) tablet 2 mg ( Oral See Alternative 07/31/23 0704)    Or  LORazepam (ATIVAN) tablet 0-4 mg (1 mg Oral Given 07/31/23 0704)  thiamine (VITAMIN B1) tablet 100 mg (100 mg Oral Given 07/31/23  0705)    Or  thiamine (VITAMIN B1) injection 100 mg ( Intravenous See Alternative 07/31/23 0705)  dextrose 50 % solution 50 mL (has no administration in time range)  potassium chloride SA (KLOR-CON M) CR tablet 40 mEq (40 mEq Oral Given 07/31/23 0652)  ondansetron (ZOFRAN) injection 4 mg (4 mg Intravenous Given 07/31/23 0653)  sodium chloride 0.9 % bolus 1,000 mL (1,000 mLs Intravenous Bolus 07/31/23 0646)  LORazepam (ATIVAN) tablet 2 mg (2 mg Oral Given 07/31/23 0653)  levETIRAcetam (KEPPRA) IVPB 1000 mg/100 mL premix (1,000 mg Intravenous New Bag/Given 07/31/23 0648)  doxycycline (VIBRA-TABS) tablet 100 mg (100 mg Oral Given 07/31/23 1610)   ED Course/ Medical Decision Making/ A&P                                 Medical Decision Making Amount and/or Complexity of Data Reviewed Labs: ordered.  Risk OTC drugs. Prescription drug management.   Alcohol withdrawal without overt delirium tremens.  Seizure secondary to medication noncompliance.  Abrasion of right third finger with possible mild cellulitis.  I have reviewed his laboratory tests, and my interpretation is mild hypokalemia and oral potassium has been ordered.  Mild metabolic acidosis with elevated anion gap which is most likely alcoholic ketoacidosis, elevated transaminases significantly higher than on 07/18/2023, normal CBC, ethanol level 406 indicative of severe ethanol intoxication, negative urine drug screen.  I have ordered IV fluids, intravenous levetiracetam, oral lorazepam, oral doxycycline.  I have ordered a magnesium level.  I have reviewed his past records, and he was admitted 07/16/2023-07/18/2019 for for alcoholic pancreatitis, admitted 06/11/2023 - 08/2023 with alcoholic ketoacidosis and alcohol withdrawal.  Magnesium has come back normal but at the lower end of normal.  I have ordered intravenous magnesium.  He will need to be observed for response to fluids and lorazepam, may possibly need to be admitted for alcohol withdrawal.   Case is signed out to Dr. Freida Busman.  CRITICAL CARE Performed by: Dione Booze Total critical care time: 40 minutes Critical care time was exclusive of separately billable procedures and treating other patients. Critical care was necessary to treat or prevent imminent or life-threatening deterioration. Critical care was time spent personally by me on the following activities: development of treatment plan with patient and/or surrogate as well as nursing, discussions with consultants, evaluation of patient's response to treatment, examination of patient, obtaining history from patient or surrogate, ordering and performing treatments and interventions, ordering and review of laboratory studies, ordering and review of radiographic studies, pulse oximetry and re-evaluation of patient's condition.  Final Clinical Impression(s) / ED Diagnoses Final diagnoses:  Alcohol withdrawal syndrome without complication (HCC)  Hypoglycemia  Elevated transaminase level    Rx / DC Orders ED Discharge Orders     None  Dione Booze, MD 07/31/23 [phone]

## 2023-07-31 NOTE — ED Notes (Signed)
Breakfast tray given to patient able to tolerate well. Patient ambulated independently to bathroom, he denies pain, SOB or chest discomfort. No N/V/D voiced at this time. Patient remains in bed with eyes closed resting quietly, care ongoing.

## 2023-07-31 NOTE — ED Notes (Signed)
Pt. Refused oral meds RN received verbal order from MD to give Ativan and Tiamine IV.

## 2023-08-02 ENCOUNTER — Encounter (HOSPITAL_COMMUNITY): Payer: Self-pay | Admitting: Emergency Medicine

## 2023-08-02 ENCOUNTER — Emergency Department (HOSPITAL_COMMUNITY)
Admission: EM | Admit: 2023-08-02 | Discharge: 2023-08-03 | Disposition: A | Discharge status: Home / Self Care | Payer: MEDICAID | Attending: Emergency Medicine | Admitting: Emergency Medicine

## 2023-08-02 ENCOUNTER — Other Ambulatory Visit (HOSPITAL_COMMUNITY): Payer: Self-pay

## 2023-08-02 DIAGNOSIS — D72819 Decreased white blood cell count, unspecified: Secondary | ICD-10-CM | POA: Diagnosis not present

## 2023-08-02 DIAGNOSIS — F10129 Alcohol abuse with intoxication, unspecified: Secondary | ICD-10-CM | POA: Diagnosis present

## 2023-08-02 DIAGNOSIS — Y908 Blood alcohol level of 240 mg/100 ml or more: Secondary | ICD-10-CM | POA: Insufficient documentation

## 2023-08-02 DIAGNOSIS — Z21 Asymptomatic human immunodeficiency virus [HIV] infection status: Secondary | ICD-10-CM | POA: Insufficient documentation

## 2023-08-02 DIAGNOSIS — F10920 Alcohol use, unspecified with intoxication, uncomplicated: Secondary | ICD-10-CM

## 2023-08-02 DIAGNOSIS — R7401 Elevation of levels of liver transaminase levels: Secondary | ICD-10-CM

## 2023-08-02 DIAGNOSIS — G40909 Epilepsy, unspecified, not intractable, without status epilepticus: Secondary | ICD-10-CM | POA: Diagnosis not present

## 2023-08-02 DIAGNOSIS — R45851 Suicidal ideations: Secondary | ICD-10-CM | POA: Diagnosis not present

## 2023-08-02 DIAGNOSIS — K859 Acute pancreatitis without necrosis or infection, unspecified: Secondary | ICD-10-CM | POA: Diagnosis not present

## 2023-08-02 DIAGNOSIS — E876 Hypokalemia: Secondary | ICD-10-CM | POA: Diagnosis not present

## 2023-08-02 DIAGNOSIS — D696 Thrombocytopenia, unspecified: Secondary | ICD-10-CM | POA: Diagnosis not present

## 2023-08-02 LAB — COMPREHENSIVE METABOLIC PANEL WITH GFR
ALT: 77 U/L — ABNORMAL HIGH (ref 0–44)
AST: 102 U/L — ABNORMAL HIGH (ref 15–41)
Albumin: 3.4 g/dL — ABNORMAL LOW (ref 3.5–5.0)
Alkaline Phosphatase: 83 U/L (ref 38–126)
Anion gap: 10 (ref 5–15)
BUN: <5 mg/dL — ABNORMAL LOW (ref 6–20)
CO2: 26 mmol/L (ref 22–32)
Calcium: 8.6 mg/dL — ABNORMAL LOW (ref 8.9–10.3)
Chloride: 103 mmol/L (ref 98–111)
Creatinine, Ser: 0.37 mg/dL — ABNORMAL LOW (ref 0.61–1.24)
GFR, Estimated: >60 mL/min
Glucose, Bld: 109 mg/dL — ABNORMAL HIGH (ref 70–99)
Potassium: 3.3 mmol/L — ABNORMAL LOW (ref 3.5–5.1)
Sodium: 139 mmol/L (ref 135–145)
Total Bilirubin: 0.6 mg/dL (ref 0.3–1.2)
Total Protein: 7.5 g/dL (ref 6.5–8.1)

## 2023-08-02 LAB — CBC
HCT: 39.4 % (ref 39.0–52.0)
Hemoglobin: 13 g/dL (ref 13.0–17.0)
MCH: 31.8 pg (ref 26.0–34.0)
MCHC: 33 g/dL (ref 30.0–36.0)
MCV: 96.3 fL (ref 80.0–100.0)
Platelets: 149 10*3/uL — ABNORMAL LOW (ref 150–400)
RBC: 4.09 MIL/uL — ABNORMAL LOW (ref 4.22–5.81)
RDW: 15.5 % (ref 11.5–15.5)
WBC: 3.1 10*3/uL — ABNORMAL LOW (ref 4.0–10.5)
nRBC: 0 % (ref 0.0–0.2)

## 2023-08-02 LAB — ETHANOL: Alcohol, Ethyl (B): 379 mg/dL

## 2023-08-02 LAB — MAGNESIUM: Magnesium: 1.7 mg/dL (ref 1.7–2.4)

## 2023-08-02 LAB — LIPASE, BLOOD: Lipase: 114 U/L — ABNORMAL HIGH (ref 11–51)

## 2023-08-02 MED ORDER — LORAZEPAM 1 MG PO TABS
1.0000 mg | ORAL_TABLET | ORAL | Status: DC | PRN
  Administered 2023-08-03: 3 mg via ORAL
  Filled 2023-08-02: qty 3
  Filled 2023-08-02: qty 1

## 2023-08-02 MED ORDER — THIAMINE MONONITRATE 100 MG PO TABS: 100.0000 mg | ORAL_TABLET | Freq: Every day | ORAL | Status: DC

## 2023-08-02 MED ORDER — THIAMINE HCL 100 MG/ML IJ SOLN: 100.0000 mg | Freq: Every day | INTRAMUSCULAR | Status: DC

## 2023-08-02 MED ORDER — SODIUM CHLORIDE 0.9 % IV BOLUS
1000.0000 mL | Freq: Once | INTRAVENOUS | Status: AC
  Administered 2023-08-02: 1000 mL via INTRAVENOUS

## 2023-08-02 MED ORDER — LEVETIRACETAM IN NACL 1000 MG/100ML IV SOLN
1000.0000 mg | Freq: Once | INTRAVENOUS | Status: AC
  Administered 2023-08-03: 1000 mg via INTRAVENOUS
  Filled 2023-08-02: qty 100

## 2023-08-02 MED ORDER — ONDANSETRON HCL 4 MG/2ML IJ SOLN
4.0000 mg | Freq: Once | INTRAMUSCULAR | Status: AC
  Administered 2023-08-02: 4 mg via INTRAVENOUS
  Filled 2023-08-02: qty 2

## 2023-08-02 MED ORDER — POTASSIUM CHLORIDE CRYS ER 20 MEQ PO TBCR
40.0000 meq | EXTENDED_RELEASE_TABLET | Freq: Once | ORAL | Status: AC
  Administered 2023-08-03: 40 meq via ORAL
  Filled 2023-08-02: qty 2

## 2023-08-02 MED ORDER — LORAZEPAM 1 MG PO TABS
2.0000 mg | ORAL_TABLET | Freq: Once | ORAL | Status: AC
  Administered 2023-08-02: 2 mg via ORAL
  Filled 2023-08-02: qty 2

## 2023-08-02 MED ORDER — LORAZEPAM 1 MG PO TABS: 2.0000 mg | ORAL_TABLET | ORAL | Status: DC | PRN

## 2023-08-02 MED ORDER — ADULT MULTIVITAMIN W/MINERALS CH: 1.0000 | ORAL_TABLET | Freq: Every day | ORAL | Status: DC

## 2023-08-02 MED ORDER — FOLIC ACID 1 MG PO TABS: 1.0000 mg | ORAL_TABLET | Freq: Every day | ORAL | Status: DC

## 2023-08-02 NOTE — ED Triage Notes (Signed)
Pt arriving via [facility] for intoxication and is requesting to be IVC'd. [place] [address] by [place] and told them he had "a little wine". Pt states he will kill himself is he is D/C'd. Pt also reporting arm and leg pain.

## 2023-08-02 NOTE — ED Provider Notes (Signed)
[place] EMERGENCY DEPARTMENT AT [facility] Provider Note   CSN: [id number] Arrival date & time: 08/02/23  1957     History {Add pertinent medical, surgical, social history, OB history to HPI:1} Chief Complaint  Patient presents with   Alcohol Intoxication   Suicidal    Gary [name] is a 37 y.o. male.  The history is provided by the patient.  Alcohol Intoxication  He has history of HIV infection, bipolar disorder, seizure disorder, alcohol abuse and comes in stating that he had at least 1 seizure today.  He also endorses both homicidal and suicidal ideation.  He states he has been drinking heavily but has not had a drink since this morning.  He had a seizure during which he had fecal and urinary incontinence but denies bit lip or tongue.  He does endorses visual hallucinations of hearing voices but without commands.  He states that he has both homicidal and suicidal thoughts of getting a gun and shooting as many people as he could.  He was in the emergency department recently and was discharged with a prescription for chlordiazepoxide, but he never picked the prescription up.   Home Medications Prior to Admission medications   Medication Sig Start Date End Date Taking? Authorizing Provider  bictegravir-emtricitabine-tenofovir AF (BIKTARVY) 50-200-25 MG TABS tablet Take 1 tablet by mouth daily. Patient not taking: Reported on 07/17/2023 04/12/23 08/10/23  Rutha Bouchard T, MD  chlordiazePOXIDE (LIBRIUM) 25 MG capsule Take 2 capsules (50 mg total) 3 (three) times daily for 1 day, THEN take 1 - 2 capsules twice daily for 1 day, THEN take 1 - 2 capsules daily for 1 day. 07/31/23 08/04/23  Lorre Nick, MD  levETIRAcetam (KEPPRA) 500 MG tablet Take 1 tablet (500 mg total) by mouth 2 (two) times daily. Patient not taking: Reported on 07/17/2023 05/21/23   Jacalyn Lefevre, MD  ondansetron (ZOFRAN-ODT) 8 MG disintegrating tablet Take 1 tablet by mouth every 8 hours as needed for nausea or  vomiting. Patient not taking: Reported on 05/26/2023 05/21/23   Jacalyn Lefevre, MD  famotidine (PEPCID) 20 MG tablet Take 1 tablet (20 mg total) by mouth 2 (two) times daily. Patient not taking: Reported on 06/01/2019 05/14/19 06/02/19  Benjiman Core, MD      Allergies    Tegretol [carbamazepine] and Caffeine    Review of Systems   Review of Systems  All other systems reviewed and are negative.   Physical Exam Updated Vital Signs BP 116/80 (BP Location: Left Arm)   Pulse 86   Resp 18   SpO2 95%  Physical Exam Vitals and nursing note reviewed.   37 year old male, resting comfortably and in no acute distress. Vital signs are normal. Oxygen saturation is 95%, which is normal. Head is normocephalic and atraumatic. PERRLA, EOMI. Oropharynx is clear. Neck is nontender and supple without adenopathy. Lungs are clear without rales, wheezes, or rhonchi. Chest is nontender. Heart has regular rate and rhythm without murmur. Abdomen is soft, flat, nontender. Extremities have no cyanosis or edema, full range of motion is present. Skin is warm and dry.  Multiple excoriations present on arms and legs but without signs of active infection. Neurologic: Awake and alert, cranial nerves are intact, moves all extremities equally.  He is moderately tremulous.  ED Results / Procedures / Treatments   Labs (all labs ordered are listed, but only abnormal results are displayed) Labs Reviewed  COMPREHENSIVE METABOLIC PANEL - Abnormal; Notable for the following components:  Result Value   Potassium 3.3 (*)    Glucose, Bld 109 (*)    BUN <5 (*)    Creatinine, Ser 0.37 (*)    Calcium 8.6 (*)    Albumin 3.4 (*)    AST 102 (*)    ALT 77 (*)    All other components within normal limits  ETHANOL - Abnormal; Notable for the following components:   Alcohol, Ethyl (B) 379 (*)    All other components within normal limits  CBC - Abnormal; Notable for the following components:   WBC 3.1 (*)    RBC  4.09 (*)    Platelets 149 (*)    All other components within normal limits  RAPID URINE DRUG SCREEN, HOSP PERFORMED  LIPASE, BLOOD  MAGNESIUM    EKG None  Radiology No results found.  Procedures Procedures  {Document cardiac monitor, telemetry assessment procedure when appropriate:1}  Medications Ordered in ED Medications - No data to display  ED Course/ Medical Decision Making/ A&P   {   Click here for ABCD2, HEART and other calculatorsREFRESH Note before signing :1}                              Medical Decision Making Amount and/or Complexity of Data Reviewed Labs: ordered.   Alcohol abuse with probable alcohol withdrawal seizure, suicidal and homicidal ideation.  I have reviewed his past records, and he was in the ED on 07/30/2023 and discharged on 07/31/2023 with prescription for chlordiazepoxide, admitted to the hospital 07/16/2023 and discharged on 07/18/2023 with alcoholic pancreatitis, admitted 06/11/2023-06/19/2023 with alcoholic ketoacidosis.  He has been referred for to outpatient detox programs numerous times.  He has been on levetiracetam, I am assuming that he has not been taking it and I am ordering a loading dose of levetiracetam.  I am also ordering lorazepam per CIWA protocol.  I have ordered IV fluids and intravenous ondansetron for nausea.  I have reviewed his laboratory test, and my interpretation is mild hypokalemia and I am ordering a dose of oral potassium, mildly elevated random glucose which will need to be followed as an outpatient, mild elevation of transaminases which is not significantly changed from baseline, mild leukopenia which is also not significantly changed from baseline, borderline thrombocytopenia which is not felt to be clinically significant, ethanol level 379 consistent with ethanol intoxication.  Mental status will need to be reevaluated once he is more sober.  I have added lipase and magnesium to tests ordered from triage.  {Document critical care  time when appropriate:1} {Document review of labs and clinical decision tools ie heart score, Chads2Vasc2 etc:1}  {Document your independent review of radiology images, and any outside records:1} {Document your discussion with family members, caretakers, and with consultants:1} {Document social determinants of health affecting pt's care:1} {Document your decision making why or why not admission, treatments were needed:1} Final Clinical Impression(s) / ED Diagnoses Final diagnoses:  None    Rx / DC Orders ED Discharge Orders     None

## 2023-08-02 NOTE — ED Notes (Signed)
Date and time results received: 08/02/23 2314 (use smartphrase ".now" to insert current time)  Test: etoh Critical Value: 379  Name of Provider Notified: Tonette Lederer, MD  Orders Received? Or Actions Taken?:  n/a

## 2023-08-03 ENCOUNTER — Other Ambulatory Visit (HOSPITAL_COMMUNITY): Payer: Self-pay

## 2023-08-03 MED ORDER — PANTOPRAZOLE SODIUM 40 MG PO TBEC
40.0000 mg | DELAYED_RELEASE_TABLET | Freq: Once | ORAL | Status: DC
  Filled 2023-08-03: qty 1

## 2023-08-03 MED ORDER — CHLORDIAZEPOXIDE HCL 25 MG PO CAPS: ORAL_CAPSULE | ORAL | 0 refills | Status: AC

## 2023-08-03 MED ORDER — PANTOPRAZOLE SODIUM 40 MG PO TBEC
40.0000 mg | DELAYED_RELEASE_TABLET | Freq: Every day | ORAL | 0 refills | Status: DC
  Filled 2023-08-03: qty 30, 30d supply, fill #0

## 2023-08-03 MED ORDER — OXYCODONE-ACETAMINOPHEN 5-325 MG PO TABS
1.0000 | ORAL_TABLET | Freq: Once | ORAL | Status: AC
  Administered 2023-08-03: 1 via ORAL
  Filled 2023-08-03: qty 1

## 2023-08-03 MED ORDER — ALUM & MAG HYDROXIDE-SIMETH 200-200-20 MG/5ML PO SUSP
30.0000 mL | Freq: Once | ORAL | Status: DC
  Filled 2023-08-03: qty 30

## 2023-08-03 MED ORDER — LEVETIRACETAM 500 MG PO TABS
500.0000 mg | ORAL_TABLET | Freq: Two times a day (BID) | ORAL | 11 refills | Status: DC
  Filled 2023-08-03: qty 60, 30d supply, fill #0

## 2023-08-03 MED ORDER — MAGNESIUM SULFATE 2 GM/50ML IV SOLN
2.0000 g | Freq: Once | INTRAVENOUS | Status: AC
  Administered 2023-08-03: 2 g via INTRAVENOUS
  Filled 2023-08-03: qty 50

## 2023-08-03 MED ORDER — POTASSIUM CHLORIDE CRYS ER 20 MEQ PO TBCR
20.0000 meq | EXTENDED_RELEASE_TABLET | Freq: Two times a day (BID) | ORAL | 0 refills | Status: DC
  Filled 2023-08-03: qty 10, 5d supply, fill #0

## 2023-08-03 NOTE — ED Notes (Addendum)
Pt has 2 patient belongings bags and one black bag in cabinet for [employer]

## 2023-08-03 NOTE — Discharge Instructions (Addendum)
Do not drink any alcohol whatsoever.  Any alcohol you drink or make your pancreas more inflamed and increase your abdominal pain.  Please follow-up with one of the outpatient detox programs listed in the resource guide.  It is very important that you take levetiracetam every day.  This is to prevent you from having additional seizures.

## 2023-08-06 ENCOUNTER — Encounter (HOSPITAL_COMMUNITY): Payer: Self-pay

## 2023-08-06 ENCOUNTER — Emergency Department (HOSPITAL_COMMUNITY)
Admission: EM | Admit: 2023-08-06 | Discharge: 2023-08-07 | Disposition: A | Discharge status: Home / Self Care | Payer: MEDICAID | Attending: Emergency Medicine | Admitting: Emergency Medicine

## 2023-08-06 ENCOUNTER — Other Ambulatory Visit: Payer: Self-pay

## 2023-08-06 DIAGNOSIS — R441 Visual hallucinations: Secondary | ICD-10-CM | POA: Insufficient documentation

## 2023-08-06 DIAGNOSIS — Y908 Blood alcohol level of 240 mg/100 ml or more: Secondary | ICD-10-CM | POA: Insufficient documentation

## 2023-08-06 DIAGNOSIS — F10121 Alcohol abuse with intoxication delirium: Secondary | ICD-10-CM | POA: Insufficient documentation

## 2023-08-06 DIAGNOSIS — F10221 Alcohol dependence with intoxication delirium: Secondary | ICD-10-CM

## 2023-08-06 LAB — PROTIME-INR
INR: 1 (ref 0.8–1.2)
Prothrombin Time: 13.6 s (ref 11.4–15.2)

## 2023-08-06 LAB — CBC WITH DIFFERENTIAL/PLATELET
Abs Immature Granulocytes: 0.01 10*3/uL (ref 0.00–0.07)
Basophils Absolute: 0 10*3/uL (ref 0.0–0.1)
Basophils Relative: 1 %
Eosinophils Absolute: 0 10*3/uL (ref 0.0–0.5)
Eosinophils Relative: 0 %
HCT: 41.8 % (ref 39.0–52.0)
Hemoglobin: 13.6 g/dL (ref 13.0–17.0)
Immature Granulocytes: 0 %
Lymphocytes Relative: 45 %
Lymphs Abs: 1.6 10*3/uL (ref 0.7–4.0)
MCH: 32.5 pg (ref 26.0–34.0)
MCHC: 32.5 g/dL (ref 30.0–36.0)
MCV: 100 fL (ref 80.0–100.0)
Monocytes Absolute: 0.2 10*3/uL (ref 0.1–1.0)
Monocytes Relative: 6 %
Neutro Abs: 1.7 10*3/uL (ref 1.7–7.7)
Neutrophils Relative %: 48 %
Platelets: 113 10*3/uL — ABNORMAL LOW (ref 150–400)
RBC: 4.18 MIL/uL — ABNORMAL LOW (ref 4.22–5.81)
RDW: 15.9 % — ABNORMAL HIGH (ref 11.5–15.5)
WBC: 3.5 10*3/uL — ABNORMAL LOW (ref 4.0–10.5)
nRBC: 0 % (ref 0.0–0.2)

## 2023-08-06 LAB — COMPREHENSIVE METABOLIC PANEL
ALT: 82 U/L — ABNORMAL HIGH (ref 0–44)
AST: 182 U/L — ABNORMAL HIGH (ref 15–41)
Albumin: 3.6 g/dL (ref 3.5–5.0)
Alkaline Phosphatase: 89 U/L (ref 38–126)
Anion gap: 18 — ABNORMAL HIGH (ref 5–15)
BUN: 9 mg/dL (ref 6–20)
CO2: 19 mmol/L — ABNORMAL LOW (ref 22–32)
Calcium: 8.3 mg/dL — ABNORMAL LOW (ref 8.9–10.3)
Chloride: 101 mmol/L (ref 98–111)
Creatinine, Ser: 0.58 mg/dL — ABNORMAL LOW (ref 0.61–1.24)
GFR, Estimated: >60 mL/min (ref >60)
Glucose, Bld: 79 mg/dL (ref 70–99)
Potassium: 4.4 mmol/L (ref 3.5–5.1)
Sodium: 138 mmol/L (ref 135–145)
Total Bilirubin: 0.9 mg/dL (ref 0.3–1.2)
Total Protein: 7.7 g/dL (ref 6.5–8.1)

## 2023-08-06 LAB — ETHANOL: Alcohol, Ethyl (B): 411 mg/dL (ref <10)

## 2023-08-06 LAB — LIPASE, BLOOD: Lipase: 142 U/L — ABNORMAL HIGH (ref 11–51)

## 2023-08-06 MED ORDER — THIAMINE HCL 100 MG/ML IJ SOLN
100.0000 mg | Freq: Every day | INTRAMUSCULAR | Status: DC
  Administered 2023-08-06: 100 mg via INTRAVENOUS
  Filled 2023-08-06: qty 2

## 2023-08-06 MED ORDER — LORAZEPAM 2 MG/ML IJ SOLN
0.0000 mg | Freq: Four times a day (QID) | INTRAMUSCULAR | Status: DC
  Administered 2023-08-06: 1 mg via INTRAVENOUS
  Administered 2023-08-07: 2 mg via INTRAVENOUS
  Filled 2023-08-06 (×2): qty 1

## 2023-08-06 MED ORDER — LORAZEPAM 2 MG/ML IJ SOLN: 0.0000 mg | Freq: Two times a day (BID) | INTRAMUSCULAR | Status: DC

## 2023-08-06 MED ORDER — LORAZEPAM 1 MG PO TABS: 0.0000 mg | ORAL_TABLET | Freq: Two times a day (BID) | ORAL | Status: DC

## 2023-08-06 MED ORDER — LACTATED RINGERS IV SOLN: INTRAVENOUS | Status: DC

## 2023-08-06 MED ORDER — LORAZEPAM 1 MG PO TABS: 0.0000 mg | ORAL_TABLET | Freq: Four times a day (QID) | ORAL | Status: DC

## 2023-08-06 MED ORDER — THIAMINE MONONITRATE 100 MG PO TABS: 100.0000 mg | ORAL_TABLET | Freq: Every day | ORAL | Status: DC

## 2023-08-06 NOTE — ED Provider Notes (Signed)
EMERGENCY DEPARTMENT AT [facility] Provider Note   CSN: [id number] Arrival date & time: 08/06/23  1709     History  Chief Complaint  Patient presents with   Hallucinations    Gary [name] is a 37 y.o. male.  HPI Patient reports that he has a really bad problem with alcohol.  He reports it is killing him.  Patient reports he feels suicidal sometimes.  He denies any intent to hurt other people.  He reports he is having visual hallucinations.  He knows there is person down the hall from his stretcher who is just waiting to get him however, he reports he has Jesus sitting right next to him who is protecting him.  Patient reports his last alcohol consumption was this morning.  He reports he quit all other drugs 4 days ago.  Patient reports that he does not recall exactly how he got to the emergency department but knows that the EMS basically made him come because he was in a bad way.  He reports he is having tremors and spasms because he has history of seizures.    Home Medications Prior to Admission medications   Medication Sig Start Date End Date Taking? Authorizing Provider  bictegravir-emtricitabine-tenofovir AF (BIKTARVY) 50-200-25 MG TABS tablet Take 1 tablet by mouth daily. Patient not taking: Reported on 07/17/2023 04/12/23 08/10/23  Rutha Bouchard T, MD  chlordiazePOXIDE (LIBRIUM) 25 MG capsule Take 2 capsules (50 mg total) 3 (three) times daily for 1 day, THEN take 1 - 2 capsules twice daily for 1 day, THEN take 1 - 2 capsules daily for 1 day. 08/03/23 08/06/23  Dione Booze, MD  levETIRAcetam (KEPPRA) 500 MG tablet Take 1 tablet (500 mg total) by mouth 2 (two) times daily. 08/03/23   Dione Booze, MD  ondansetron (ZOFRAN-ODT) 8 MG disintegrating tablet Take 1 tablet by mouth every 8 hours as needed for nausea or vomiting. Patient not taking: Reported on 05/26/2023 05/21/23   Jacalyn Lefevre, MD  pantoprazole (PROTONIX) 40 MG tablet Take 1 tablet (40 mg total) by mouth  daily. 08/03/23   Dione Booze, MD  potassium chloride SA (KLOR-CON M) 20 MEQ tablet Take 1 tablet (20 mEq total) by mouth 2 (two) times daily. 08/03/23   Dione Booze, MD  famotidine (PEPCID) 20 MG tablet Take 1 tablet (20 mg total) by mouth 2 (two) times daily. Patient not taking: Reported on 06/01/2019 05/14/19 06/02/19  Benjiman Core, MD      Allergies    Tegretol [carbamazepine] and Caffeine    Review of Systems   Review of Systems  Physical Exam Updated Vital Signs BP 105/66   Pulse 83   Temp 98.3 F (36.8 C)   Resp 16   SpO2 96%  Physical Exam Constitutional:      Comments: Patient was sleeping as I approached his stretcher.  He awakened with a large startle.  No respiratory distress.  He does appear very thin and malnutrition.  Disheveled.  HENT:     Head:     Comments: Patient has a Band-Aid over a small cut on the left brow without any associated hematoma.  He reports this happened a few days ago.    Mouth/Throat:     Pharynx: Oropharynx is clear.  Eyes:     Extraocular Movements: Extraocular movements intact.  Cardiovascular:     Rate and Rhythm: Tachycardia present.  Pulmonary:     Effort: Pulmonary effort is normal.  Abdominal:  General: There is no distension.  Musculoskeletal:        General: Normal range of motion.  Skin:    General: Skin is warm and dry.  Neurological:     Comments: Patient is alert and hypervigilant.  He is staying fairly on track for most of the conversation but then derivations into describing people who are not present and stream of consciousness about paranoid thoughts such as they are just waiting to get him but he is keeping his eye on them.  Patient occasionally has a clonic jerk.  He reports that is a manifestation of his seizures.  No focal motor deficit.  Psychiatric:     Comments: Patient appears hypervigilant.  He is remaining fairly calm and during our interaction but can quickly escalate and de-escalate.     ED Results /  Procedures / Treatments   Labs (all labs ordered are listed, but only abnormal results are displayed) Labs Reviewed  COMPREHENSIVE METABOLIC PANEL - Abnormal; Notable for the following components:      Result Value   CO2 19 (*)    Creatinine, Ser 0.58 (*)    Calcium 8.3 (*)    AST 182 (*)    ALT 82 (*)    Anion gap 18 (*)    All other components within normal limits  ETHANOL - Abnormal; Notable for the following components:   Alcohol, Ethyl (B) 411 (*)    All other components within normal limits  LIPASE, BLOOD - Abnormal; Notable for the following components:   Lipase 142 (*)    All other components within normal limits  CBC WITH DIFFERENTIAL/PLATELET - Abnormal; Notable for the following components:   WBC 3.5 (*)    RBC 4.18 (*)    RDW 15.9 (*)    Platelets 113 (*)    All other components within normal limits  PROTIME-INR  URINALYSIS, ROUTINE W REFLEX MICROSCOPIC  RAPID URINE DRUG SCREEN, HOSP PERFORMED    EKG EKG Interpretation Date/Time:  Friday August 06 2023 20:12:51 EDT Ventricular Rate:  101 PR Interval:  134 QRS Duration:  97 QT Interval:  360 QTC Calculation: 467 R Axis:   72  Text Interpretation: Sinus tachycardia Abnormal R-wave progression, early transition Probable anterolateral infarct, old similar to older tracings with variable anterior T wave amplitude Confirmed by Arby Barrette [id number]) on 08/06/2023 8:56:02 PM  Radiology No results found.  Procedures Procedures    Medications Ordered in ED Medications  LORazepam (ATIVAN) injection 0-4 mg (1 mg Intravenous Given 08/06/23 2029)    Or  LORazepam (ATIVAN) tablet 0-4 mg ( Oral See Alternative 08/06/23 2029)  LORazepam (ATIVAN) injection 0-4 mg (has no administration in time range)    Or  LORazepam (ATIVAN) tablet 0-4 mg (has no administration in time range)  thiamine (VITAMIN B1) tablet 100 mg ( Oral See Alternative 08/06/23 2028)    Or  thiamine (VITAMIN B1) injection 100 mg (100 mg  Intravenous Given 08/06/23 2028)  lactated ringers infusion ( Intravenous New Bag/Given 08/06/23 2028)    ED Course/ Medical Decision Making/ A&P                                 Medical Decision Making Amount and/or Complexity of Data Reviewed Labs: ordered.  Risk OTC drugs. Prescription drug management.  Patient was brought in by EMS.  Was in disheveled condition and talking about people watching him and demons.  Patient reports  that his main problem is that he has a serious alcohol problem.  He reports that he has been drinking so much that its gotten hard for him to eat and as soon as he wakes up in the morning he has to have a drink.  He reports he has also been seeing shadow people that other people cannot see that are trying to get him.  He however also reports that he has been protected by Yvonne Kendall who is sitting beside him.  Patient reports sometimes he is feeling like hurting himself or killing himself but is adamant that he has no intention to hurt anybody else.  Patient does appear acutely intoxicated.  Vital signs are stable.  He awakens and is predominantly situationally oriented but is talking about visual hallucinations.  This time we will proceed with rehydration and CIWA protocol.  Obtain basic lab work.  Patient's airway is protected.  He is breathing and talking without difficulty.  Patient denies other recent drug use at least within the past 4 days.  However, question possible psychotic break with hallucinations secondary to other drug ingestion.  Continue to monitor and observe.  EtOH 411.  PT/INR normal.  Metabolic panel normal except bicarb of 19 AST 182 ALT 82 GFR greater than 60.  White count 3.5 normal H&H.  EKG interpreted by myself no significant change from prior studies.  I have checked on the patient.  He is sleeping and continues to have stable blood pressures and heart rates are in the 80s with rehydration.  Plan will be to observe and if the patient is at  mental status baseline when he is cleared from acute intoxication, can readdress any issues of ongoing hallucinations or suicidal or homicidal intent.  If patient is back to his normal baseline and does not not have any reports of suicidal or homicidal thought I do feel he could potentially be discharged.  If patient develops alcohol withdrawal or any continued ongoing suicidal thoughts with plan, will need consideration for admission and psychiatric evaluation.        Final Clinical Impression(s) / ED Diagnoses Final diagnoses:  Visual hallucinations  Alcohol intoxication delirium with moderate or severe use disorder [name])    Rx / DC Orders ED Discharge Orders     None         Arby Barrette, MD 08/06/23 2354

## 2023-08-06 NOTE — ED Triage Notes (Signed)
BIBA from the street for possible seizure. Per EMS, pt was alert and responsive upon arrival, but does endorse a/v hallucinations and seeing shadows. Pt states "The demons can't have me" 124/80 BP 104 HR 82 cbg 100% room air

## 2023-08-06 NOTE — ED Notes (Signed)
Pt O2 sat 88% while sleeping. Pt placed on nasal cannula at 2 lpm.

## 2023-08-06 NOTE — ED Notes (Signed)
Pt continuously talking to "the shadow people"

## 2023-08-07 NOTE — ED Provider Notes (Signed)
I assumed care of signout.  Patient presented for his 15th ER visit in 6 months.  Patient had presented with alcohol intoxication.  He also mentioned seeing Jesus sitting near him.  Patient is now more awake and alert.  He reports feeling tremulous.  He does have mild tremor but only mild tachycardia no hypertension. He has now more sober.  Patient had received Ativan while in the ER. At this point patient will be discharged he can follow-up directly with behavioral with urgent care for further alcohol detox needs. No signs of severe alcohol withdrawal at this time    Zadie Rhine, MD 08/07/23 [phone]

## 2023-08-08 ENCOUNTER — Other Ambulatory Visit: Payer: Self-pay

## 2023-08-08 ENCOUNTER — Emergency Department (HOSPITAL_COMMUNITY)
Admission: EM | Admit: 2023-08-08 | Discharge: 2023-08-09 | Disposition: A | Discharge status: Home / Self Care | Payer: MEDICAID | Attending: Emergency Medicine | Admitting: Emergency Medicine

## 2023-08-08 DIAGNOSIS — F10920 Alcohol use, unspecified with intoxication, uncomplicated: Secondary | ICD-10-CM | POA: Insufficient documentation

## 2023-08-08 DIAGNOSIS — Z21 Asymptomatic human immunodeficiency virus [HIV] infection status: Secondary | ICD-10-CM | POA: Diagnosis not present

## 2023-08-08 DIAGNOSIS — Y908 Blood alcohol level of 240 mg/100 ml or more: Secondary | ICD-10-CM | POA: Insufficient documentation

## 2023-08-08 LAB — CBC WITH DIFFERENTIAL/PLATELET
Abs Immature Granulocytes: 0 10*3/uL (ref 0.00–0.07)
Basophils Absolute: 0 10*3/uL (ref 0.0–0.1)
Basophils Relative: 1 %
Eosinophils Absolute: 0 10*3/uL (ref 0.0–0.5)
Eosinophils Relative: 1 %
HCT: 37.7 % — ABNORMAL LOW (ref 39.0–52.0)
Hemoglobin: 12.5 g/dL — ABNORMAL LOW (ref 13.0–17.0)
Immature Granulocytes: 0 %
Lymphocytes Relative: 60 %
Lymphs Abs: 1.7 10*3/uL (ref 0.7–4.0)
MCH: 32.4 pg (ref 26.0–34.0)
MCHC: 33.2 g/dL (ref 30.0–36.0)
MCV: 97.7 fL (ref 80.0–100.0)
Monocytes Absolute: 0.2 10*3/uL (ref 0.1–1.0)
Monocytes Relative: 8 %
Neutro Abs: 0.9 10*3/uL — ABNORMAL LOW (ref 1.7–7.7)
Neutrophils Relative %: 30 %
Platelets: 115 10*3/uL — ABNORMAL LOW (ref 150–400)
RBC: 3.86 MIL/uL — ABNORMAL LOW (ref 4.22–5.81)
RDW: 15.9 % — ABNORMAL HIGH (ref 11.5–15.5)
WBC: 2.9 10*3/uL — ABNORMAL LOW (ref 4.0–10.5)
nRBC: 0 % (ref 0.0–0.2)

## 2023-08-08 LAB — BASIC METABOLIC PANEL
Anion gap: 11 (ref 5–15)
BUN: <5 mg/dL — ABNORMAL LOW (ref 6–20)
CO2: 25 mmol/L (ref 22–32)
Calcium: 8.2 mg/dL — ABNORMAL LOW (ref 8.9–10.3)
Chloride: 100 mmol/L (ref 98–111)
Creatinine, Ser: 0.49 mg/dL — ABNORMAL LOW (ref 0.61–1.24)
GFR, Estimated: >60 mL/min (ref >60)
Glucose, Bld: 91 mg/dL (ref 70–99)
Potassium: 3.1 mmol/L — ABNORMAL LOW (ref 3.5–5.1)
Sodium: 136 mmol/L (ref 135–145)

## 2023-08-08 LAB — ETHANOL: Alcohol, Ethyl (B): 393 mg/dL (ref <10)

## 2023-08-08 MED ORDER — POTASSIUM CHLORIDE CRYS ER 20 MEQ PO TBCR
40.0000 meq | EXTENDED_RELEASE_TABLET | Freq: Once | ORAL | Status: AC
  Administered 2023-08-08: 40 meq via ORAL
  Filled 2023-08-08: qty 2

## 2023-08-08 MED ORDER — THIAMINE MONONITRATE 100 MG PO TABS
100.0000 mg | ORAL_TABLET | Freq: Once | ORAL | Status: AC
  Administered 2023-08-08: 100 mg via ORAL
  Filled 2023-08-08: qty 1

## 2023-08-08 NOTE — ED Provider Notes (Signed)
[place] EMERGENCY DEPARTMENT AT [facility] Provider Note   CSN: [id number] Arrival date & time: 08/08/23  1436     History  Chief Complaint  Patient presents with   Alcohol Problem    Gary [name] is a 37 y.o. male.  37 year old male with prior medical history as detailed below presents for evaluation.  Patient admits to alcohol consumption just prior to arrival here in the ED.  He appears to be clinically intoxicated.  He reports history of PTSD.  Appears to lack a specific emergent complaint.   He does appear inebriated enough to require workup and observation here in the ED.  The history is provided by the patient and medical records.       Home Medications Prior to Admission medications   Medication Sig Start Date End Date Taking? Authorizing Provider  bictegravir-emtricitabine-tenofovir AF (BIKTARVY) 50-200-25 MG TABS tablet Take 1 tablet by mouth daily. Patient not taking: Reported on 07/17/2023 04/12/23 08/10/23  Rutha Bouchard T, MD  levETIRAcetam (KEPPRA) 500 MG tablet Take 1 tablet (500 mg total) by mouth 2 (two) times daily. 08/03/23   Dione Booze, MD  ondansetron (ZOFRAN-ODT) 8 MG disintegrating tablet Take 1 tablet by mouth every 8 hours as needed for nausea or vomiting. Patient not taking: Reported on 05/26/2023 05/21/23   Jacalyn Lefevre, MD  pantoprazole (PROTONIX) 40 MG tablet Take 1 tablet (40 mg total) by mouth daily. 08/03/23   Dione Booze, MD  potassium chloride SA (KLOR-CON M) 20 MEQ tablet Take 1 tablet (20 mEq total) by mouth 2 (two) times daily. 08/03/23   Dione Booze, MD  famotidine (PEPCID) 20 MG tablet Take 1 tablet (20 mg total) by mouth 2 (two) times daily. Patient not taking: Reported on 06/01/2019 05/14/19 06/02/19  Benjiman Core, MD      Allergies    Tegretol [carbamazepine] and Caffeine    Review of Systems   Review of Systems  All other systems reviewed and are negative.   Physical Exam Updated Vital Signs BP 116/80 (BP  Location: Right Arm)   Pulse (!) 101   Temp 98.7 F (37.1 C) (Oral)   Resp 18   Ht 6' (1.829 m)   Wt 65.5 kg   SpO2 94%   BMI 19.58 kg/m  Physical Exam Vitals and nursing note reviewed.  Constitutional:      General: He is not in acute distress.    Appearance: Normal appearance. He is well-developed.     Comments: Appears intoxicated, admits to recent alcohol consumption  HENT:     Head: Normocephalic and atraumatic.  Eyes:     Conjunctiva/sclera: Conjunctivae normal.     Pupils: Pupils are equal, round, and reactive to light.  Cardiovascular:     Rate and Rhythm: Normal rate and regular rhythm.     Heart sounds: Normal heart sounds.  Pulmonary:     Effort: Pulmonary effort is normal. No respiratory distress.     Breath sounds: Normal breath sounds.  Abdominal:     General: There is no distension.     Palpations: Abdomen is soft.     Tenderness: There is no abdominal tenderness.  Musculoskeletal:        General: No deformity. Normal range of motion.     Cervical back: Normal range of motion and neck supple.  Skin:    General: Skin is warm and dry.  Neurological:     General: No focal deficit present.     Mental Status: He is  alert and oriented to person, place, and time.     ED Results / Procedures / Treatments   Labs (all labs ordered are listed, but only abnormal results are displayed) Labs Reviewed  CBC WITH DIFFERENTIAL/PLATELET  ETHANOL  BASIC METABOLIC PANEL    EKG None  Radiology No results found.  Procedures Procedures    Medications Ordered in ED Medications - No data to display  ED Course/ Medical Decision Making/ A&P                                 Medical Decision Making Amount and/or Complexity of Data Reviewed Labs: ordered. Radiology: ordered.  Risk OTC drugs. Prescription drug management.    Medical Screen Complete  This patient presented to the ED with complaint of suspected alcohol intoxication.  This complaint  involves an extensive number of treatment options. The initial differential diagnosis includes, but is not limited to, intoxication, metabolic abnormality, etc.  This presentation is: Acute, Chronic, Self-Limited, Previously Undiagnosed, Uncertain Prognosis, Complicated, Systemic Symptoms, and Threat to Life/Bodily Function  Patient with known history of alcohol abuse presents with suspected alcohol intoxication.  Screening labs ordered.  Oncoming provider aware of case and need for disposition.    Additional history obtained:  External records from outside sources obtained and reviewed including prior ED visits and prior Inpatient records.    Lab Tests:  I ordered and personally interpreted labs.  The pertinent results include: CBC, BMP, EtOH  Problem List / ED Course:  Suspected alcohol intoxication   Reevaluation:  After the interventions noted above, I reevaluated the patient and found that they have: improved   Disposition:  After consideration of the diagnostic results and the patients response to treatment, I feel that the patent would benefit from completion of ED evaluation.          Final Clinical Impression(s) / ED Diagnoses Final diagnoses:  Alcoholic intoxication without complication [facility])    Rx / DC Orders ED Discharge Orders     None         Wynetta Fines, MD 08/16/23 1451

## 2023-08-08 NOTE — ED Triage Notes (Signed)
Pt BIBA from street. C/o alcoholism and poss seizures . Aox4.

## 2023-08-09 ENCOUNTER — Emergency Department (HOSPITAL_COMMUNITY)
Admission: EM | Admit: 2023-08-09 | Discharge: 2023-08-09 | Disposition: A | Discharge status: Home / Self Care | Payer: MEDICAID | Source: Home / Self Care | Attending: Emergency Medicine | Admitting: Emergency Medicine

## 2023-08-09 ENCOUNTER — Encounter (HOSPITAL_COMMUNITY): Payer: Self-pay

## 2023-08-09 ENCOUNTER — Other Ambulatory Visit: Payer: Self-pay

## 2023-08-09 ENCOUNTER — Emergency Department (HOSPITAL_COMMUNITY): Payer: MEDICAID

## 2023-08-09 DIAGNOSIS — F1010 Alcohol abuse, uncomplicated: Secondary | ICD-10-CM | POA: Insufficient documentation

## 2023-08-09 MED ORDER — LEVETIRACETAM 500 MG PO TABS
500.0000 mg | ORAL_TABLET | Freq: Once | ORAL | Status: DC
  Filled 2023-08-09: qty 1

## 2023-08-09 MED ORDER — ONDANSETRON 4 MG PO TBDP
4.0000 mg | ORAL_TABLET | Freq: Once | ORAL | Status: AC
  Administered 2023-08-09: 4 mg via ORAL
  Filled 2023-08-09: qty 1

## 2023-08-09 NOTE — ED Notes (Signed)
Pt ambulated with a steady gait.

## 2023-08-09 NOTE — ED Provider Notes (Signed)
[facility] AT [facility] Provider Note   CSN: [id number] Arrival date & time: 08/09/23  1331     History  Chief Complaint  Patient presents with   Alcohol Problem    Gary [name] is a 37 y.o. male with PMHx seizures, schizophrenia, PTSD, HIV, bipolar disorder, alcohol abuse who presents to ED requesting alcohol detox. [place] and [place] having a [place]. Patient stating that he has not been able to take his keppra recently, but was able to take one of his friend's keppra pills yesterday. Patient stating that he refuses to use [facility] or outpatient resources for alcohol detox without giving a full explanation why he feels this way.   Alcohol Problem       Home Medications Prior to Admission medications   Medication Sig Start Date End Date Taking? Authorizing Provider  bictegravir-emtricitabine-tenofovir AF (BIKTARVY) 50-200-25 MG TABS tablet Take 1 tablet by mouth daily. Patient not taking: Reported on 07/17/2023 04/12/23 08/10/23  Rutha Bouchard T, MD  levETIRAcetam (KEPPRA) 500 MG tablet Take 1 tablet (500 mg total) by mouth 2 (two) times daily. 08/03/23   Dione Booze, MD  ondansetron (ZOFRAN-ODT) 8 MG disintegrating tablet Take 1 tablet by mouth every 8 hours as needed for nausea or vomiting. Patient not taking: Reported on 05/26/2023 05/21/23   Jacalyn Lefevre, MD  pantoprazole (PROTONIX) 40 MG tablet Take 1 tablet (40 mg total) by mouth daily. 08/03/23   Dione Booze, MD  potassium chloride SA (KLOR-CON M) 20 MEQ tablet Take 1 tablet (20 mEq total) by mouth 2 (two) times daily. 08/03/23   Dione Booze, MD  famotidine (PEPCID) 20 MG tablet Take 1 tablet (20 mg total) by mouth 2 (two) times daily. Patient not taking: Reported on 06/01/2019 05/14/19 06/02/19  Benjiman Core, MD      Allergies    Tegretol [carbamazepine] and Caffeine    Review of Systems   Review of Systems  Psychiatric/Behavioral:          Alcohol detox    Physical Exam Updated Vital Signs BP 112/78 (BP Location: Right Arm)   Pulse 77   Temp 98.5 F (36.9 C) (Oral)   Resp 16   Ht 6' (1.829 m)   Wt 65 kg   SpO2 96%   BMI 19.43 kg/m  Physical Exam Vitals and nursing note reviewed.  Constitutional:      General: He is not in acute distress.    Appearance: He is not ill-appearing or toxic-appearing.  HENT:     Head: Normocephalic and atraumatic.     Mouth/Throat:     Mouth: Mucous membranes are moist.  Eyes:     General: No scleral icterus.       Right eye: No discharge.        Left eye: No discharge.     Conjunctiva/sclera: Conjunctivae normal.  Cardiovascular:     Rate and Rhythm: Normal rate and regular rhythm.     Pulses: Normal pulses.     Heart sounds: Normal heart sounds. No murmur heard. Pulmonary:     Effort: Pulmonary effort is normal. No respiratory distress.     Breath sounds: Normal breath sounds. No wheezing, rhonchi or rales.  Abdominal:     General: Abdomen is flat. Bowel sounds are normal.  Musculoskeletal:     Right lower leg: No edema.     Left lower leg: No edema.  Skin:    General: Skin is warm  and dry.     Findings: No rash.  Neurological:     General: No focal deficit present.     Mental Status: He is alert. Mental status is at baseline.     Comments: GCS 15. Speech is goal oriented. No deficits appreciated to CN III-XII; symmetric eyebrow raise, no facial drooping, tongue midline. Patient has equal grip strength bilaterally with 5/5 strength against resistance in all major muscle groups bilaterally. Sensation to light touch intact. Patient moves extremities without ataxia.   Psychiatric:        Mood and Affect: Mood normal.     ED Results / Procedures / Treatments   Labs (all labs ordered are listed, but only abnormal results are displayed) Labs Reviewed - No data to display  EKG None  Radiology CT Head Wo Contrast  Result Date: 08/09/2023 CLINICAL DATA:  Recent head  trauma EXAM: CT HEAD WITHOUT CONTRAST TECHNIQUE: Contiguous axial images were obtained from the base of the skull through the vertex without intravenous contrast. RADIATION DOSE REDUCTION: This exam was performed according to the departmental dose-optimization program which includes automated exposure control, adjustment of the mA and/or kV according to patient size and/or use of iterative reconstruction technique. COMPARISON:  05/24/2023 FINDINGS: Brain: No evidence of acute infarction, hemorrhage, hydrocephalus, extra-axial collection or mass lesion/mass effect. Vascular: No hyperdense vessel or unexpected calcification. Skull: Normal. Negative for fracture or focal lesion. Sinuses/Orbits: No acute finding. Other: None. IMPRESSION: No acute intracranial abnormality noted. Electronically Signed   By: Alcide Clever M.D.   On: 08/09/2023 01:31    Procedures Procedures    Medications Ordered in ED Medications  levETIRAcetam (KEPPRA) tablet 500 mg (has no administration in time range)    ED Course/ Medical Decision Making/ A&P                                 Medical Decision Making  This patient presents to the ED for concern of alcohol detox, this involves an extensive number of treatment options, and is a complaint that carries with it a high risk of complications and morbidity.  The differential diagnosis includes DT, alcohol intoxication, seizure disorder   Co morbidities that complicate the patient evaluation  seizures, schizophrenia, PTSD, HIV, bipolar disorder, alcohol abuse    Additional history obtained:  Patient with full lab workup last night CBC: mild anemia with hgb 12.5; no leukocytosis BMP: hypokalemia at 3.1 - patient provided with oral supplementation CT head: no acute intracranial abnormality    Problem List / ED Course / Critical interventions / Medication management  Patient presents to the ED requesting to be admitted for alcohol detox. Physical exam WITHOUT  extremity ataxia/tremors or diaphoretic skin. Patient is speaking with normal affect and linear in thought. Resting comfortably in wheelchair. No vomiting in ED. Patient is tolerating sandwich and apple sauce provided to him.  Patient is afebrile with stable vitals - no tachycardia. Patient stating that his last drink was earlier this morning. Overall, patient's presentation is not concerning for DT or other alcohol detox emergencies.  Of note, patient stating that he had a seizure earlier today and has not been able to take his keppra as prescribed. Patient stating his last dose of keppra was last night and he got the pill from a friend. Will provide patient with his dose of keppra for today in ED.  Patient had workup during his ED visit last night which was  reassuring. CT head yesterday without acute abnormalities. Patient was given thiamine and potassium in ED yesterday.  Will provide patient a list for outpatient alcohol detox programs. Shared with patient that I cannot admit him for alcohol detox without medical complications as this hospital is not an alcohol detox facility. Patient stating "fine, then just give me my biscuit and I will leave". I have reviewed the patients home medicines and have made adjustments as needed Patient afebrile with stable vitals. Provided with return precautions. Discharged in good condition.           Final Clinical Impression(s) / ED Diagnoses Final diagnoses:  Alcohol abuse    Rx / DC Orders ED Discharge Orders     None         Dorthy Cooler, [place] 08/09/23 1445    Gwyneth Sprout, MD 08/14/23 [phone]

## 2023-08-09 NOTE — Discharge Instructions (Addendum)
Please see resources for detox facilities. Seek emergency care if experiencing any new or worsening symptoms.

## 2023-08-09 NOTE — ED Triage Notes (Signed)
[place] [facility] [place] Stated he wants alcohol detox help. Stated he does not know if he had a seizure or passed out.

## 2023-08-11 ENCOUNTER — Other Ambulatory Visit (HOSPITAL_COMMUNITY): Payer: Self-pay

## 2023-08-22 ENCOUNTER — Other Ambulatory Visit: Payer: Self-pay

## 2023-08-22 ENCOUNTER — Emergency Department (HOSPITAL_COMMUNITY): Payer: MEDICAID

## 2023-08-22 ENCOUNTER — Inpatient Hospital Stay (HOSPITAL_COMMUNITY)
Admission: EM | Admit: 2023-08-22 | Discharge: 2023-09-01 | DRG: 438 | Disposition: A | Discharge status: Home / Self Care | Payer: MEDICAID | Attending: Internal Medicine | Admitting: Internal Medicine

## 2023-08-22 DIAGNOSIS — G40909 Epilepsy, unspecified, not intractable, without status epilepticus: Secondary | ICD-10-CM | POA: Diagnosis present

## 2023-08-22 DIAGNOSIS — K852 Alcohol induced acute pancreatitis without necrosis or infection: Principal | ICD-10-CM | POA: Diagnosis present

## 2023-08-22 DIAGNOSIS — R64 Cachexia: Secondary | ICD-10-CM | POA: Diagnosis present

## 2023-08-22 DIAGNOSIS — G8929 Other chronic pain: Secondary | ICD-10-CM | POA: Diagnosis present

## 2023-08-22 DIAGNOSIS — E43 Unspecified severe protein-calorie malnutrition: Secondary | ICD-10-CM | POA: Diagnosis present

## 2023-08-22 DIAGNOSIS — Z79899 Other long term (current) drug therapy: Secondary | ICD-10-CM

## 2023-08-22 DIAGNOSIS — F419 Anxiety disorder, unspecified: Secondary | ICD-10-CM | POA: Diagnosis present

## 2023-08-22 DIAGNOSIS — F4310 Post-traumatic stress disorder, unspecified: Secondary | ICD-10-CM | POA: Diagnosis present

## 2023-08-22 DIAGNOSIS — Z5900 Homelessness unspecified: Secondary | ICD-10-CM | POA: Diagnosis not present

## 2023-08-22 DIAGNOSIS — E162 Hypoglycemia, unspecified: Secondary | ICD-10-CM | POA: Diagnosis present

## 2023-08-22 DIAGNOSIS — F10929 Alcohol use, unspecified with intoxication, unspecified: Secondary | ICD-10-CM

## 2023-08-22 DIAGNOSIS — I952 Hypotension due to drugs: Secondary | ICD-10-CM | POA: Diagnosis not present

## 2023-08-22 DIAGNOSIS — E876 Hypokalemia: Secondary | ICD-10-CM | POA: Diagnosis present

## 2023-08-22 DIAGNOSIS — E871 Hypo-osmolality and hyponatremia: Secondary | ICD-10-CM | POA: Diagnosis present

## 2023-08-22 DIAGNOSIS — Z811 Family history of alcohol abuse and dependence: Secondary | ICD-10-CM

## 2023-08-22 DIAGNOSIS — F17210 Nicotine dependence, cigarettes, uncomplicated: Secondary | ICD-10-CM | POA: Diagnosis present

## 2023-08-22 DIAGNOSIS — Z888 Allergy status to other drugs, medicaments and biological substances status: Secondary | ICD-10-CM | POA: Diagnosis not present

## 2023-08-22 DIAGNOSIS — Z681 Body mass index (BMI) 19 or less, adult: Secondary | ICD-10-CM

## 2023-08-22 DIAGNOSIS — F1020 Alcohol dependence, uncomplicated: Secondary | ICD-10-CM | POA: Diagnosis not present

## 2023-08-22 DIAGNOSIS — R451 Restlessness and agitation: Secondary | ICD-10-CM | POA: Diagnosis not present

## 2023-08-22 DIAGNOSIS — F209 Schizophrenia, unspecified: Secondary | ICD-10-CM | POA: Diagnosis present

## 2023-08-22 DIAGNOSIS — Z91148 Patient's other noncompliance with medication regimen for other reason: Secondary | ICD-10-CM

## 2023-08-22 DIAGNOSIS — F10229 Alcohol dependence with intoxication, unspecified: Secondary | ICD-10-CM | POA: Diagnosis present

## 2023-08-22 DIAGNOSIS — F1021 Alcohol dependence, in remission: Secondary | ICD-10-CM | POA: Diagnosis not present

## 2023-08-22 DIAGNOSIS — K760 Fatty (change of) liver, not elsewhere classified: Secondary | ICD-10-CM | POA: Diagnosis present

## 2023-08-22 DIAGNOSIS — B20 Human immunodeficiency virus [HIV] disease: Secondary | ICD-10-CM | POA: Diagnosis present

## 2023-08-22 DIAGNOSIS — F10939 Alcohol use, unspecified with withdrawal, unspecified: Secondary | ICD-10-CM | POA: Diagnosis not present

## 2023-08-22 DIAGNOSIS — Z87898 Personal history of other specified conditions: Secondary | ICD-10-CM

## 2023-08-22 DIAGNOSIS — Y908 Blood alcohol level of 240 mg/100 ml or more: Secondary | ICD-10-CM | POA: Diagnosis present

## 2023-08-22 DIAGNOSIS — Z800 Family history of malignant neoplasm of digestive organs: Secondary | ICD-10-CM

## 2023-08-22 DIAGNOSIS — Z21 Asymptomatic human immunodeficiency virus [HIV] infection status: Secondary | ICD-10-CM | POA: Diagnosis present

## 2023-08-22 DIAGNOSIS — Z9151 Personal history of suicidal behavior: Secondary | ICD-10-CM

## 2023-08-22 DIAGNOSIS — Z781 Physical restraint status: Secondary | ICD-10-CM

## 2023-08-22 DIAGNOSIS — D61818 Other pancytopenia: Secondary | ICD-10-CM | POA: Diagnosis present

## 2023-08-22 DIAGNOSIS — K859 Acute pancreatitis without necrosis or infection, unspecified: Principal | ICD-10-CM

## 2023-08-22 DIAGNOSIS — E8809 Other disorders of plasma-protein metabolism, not elsewhere classified: Secondary | ICD-10-CM | POA: Diagnosis present

## 2023-08-22 DIAGNOSIS — F10231 Alcohol dependence with withdrawal delirium: Secondary | ICD-10-CM | POA: Diagnosis present

## 2023-08-22 DIAGNOSIS — T423X5A Adverse effect of barbiturates, initial encounter: Secondary | ICD-10-CM | POA: Diagnosis not present

## 2023-08-22 DIAGNOSIS — K828 Other specified diseases of gallbladder: Secondary | ICD-10-CM | POA: Diagnosis present

## 2023-08-22 DIAGNOSIS — F10151 Alcohol abuse with alcohol-induced psychotic disorder with hallucinations: Secondary | ICD-10-CM | POA: Diagnosis not present

## 2023-08-22 DIAGNOSIS — F1024 Alcohol dependence with alcohol-induced mood disorder: Secondary | ICD-10-CM | POA: Diagnosis present

## 2023-08-22 DIAGNOSIS — R54 Age-related physical debility: Secondary | ICD-10-CM | POA: Diagnosis present

## 2023-08-22 LAB — CBC WITH DIFFERENTIAL/PLATELET
Abs Immature Granulocytes: 0.01 10*3/uL (ref 0.00–0.07)
Basophils Absolute: 0 10*3/uL (ref 0.0–0.1)
Basophils Relative: 1 %
Eosinophils Absolute: 0 10*3/uL (ref 0.0–0.5)
Eosinophils Relative: 0 %
HCT: 30 % — ABNORMAL LOW (ref 39.0–52.0)
Hemoglobin: 10.1 g/dL — ABNORMAL LOW (ref 13.0–17.0)
Immature Granulocytes: 0 %
Lymphocytes Relative: 25 %
Lymphs Abs: 0.6 10*3/uL — ABNORMAL LOW (ref 0.7–4.0)
MCH: 33.3 pg (ref 26.0–34.0)
MCHC: 33.7 g/dL (ref 30.0–36.0)
MCV: 99 fL (ref 80.0–100.0)
Monocytes Absolute: 0.3 10*3/uL (ref 0.1–1.0)
Monocytes Relative: 10 %
Neutro Abs: 1.6 10*3/uL — ABNORMAL LOW (ref 1.7–7.7)
Neutrophils Relative %: 64 %
Platelets: 102 10*3/uL — ABNORMAL LOW (ref 150–400)
RBC: 3.03 MIL/uL — ABNORMAL LOW (ref 4.22–5.81)
RDW: 16.5 % — ABNORMAL HIGH (ref 11.5–15.5)
WBC: 2.5 10*3/uL — ABNORMAL LOW (ref 4.0–10.5)
nRBC: 0 % (ref 0.0–0.2)

## 2023-08-22 LAB — COMPREHENSIVE METABOLIC PANEL
ALT: 44 U/L (ref 0–44)
AST: 66 U/L — ABNORMAL HIGH (ref 15–41)
Albumin: 3.2 g/dL — ABNORMAL LOW (ref 3.5–5.0)
Alkaline Phosphatase: 68 U/L (ref 38–126)
Anion gap: 11 (ref 5–15)
BUN: 7 mg/dL (ref 6–20)
CO2: 24 mmol/L (ref 22–32)
Calcium: 7.6 mg/dL — ABNORMAL LOW (ref 8.9–10.3)
Chloride: 98 mmol/L (ref 98–111)
Creatinine, Ser: 0.52 mg/dL — ABNORMAL LOW (ref 0.61–1.24)
GFR, Estimated: >60 mL/min (ref >60)
Glucose, Bld: 116 mg/dL — ABNORMAL HIGH (ref 70–99)
Potassium: 3.4 mmol/L — ABNORMAL LOW (ref 3.5–5.1)
Sodium: 133 mmol/L — ABNORMAL LOW (ref 135–145)
Total Bilirubin: 0.8 mg/dL (ref 0.3–1.2)
Total Protein: 6.2 g/dL — ABNORMAL LOW (ref 6.5–8.1)

## 2023-08-22 LAB — LIPASE, BLOOD: Lipase: 699 U/L — ABNORMAL HIGH (ref 11–51)

## 2023-08-22 LAB — ETHANOL: Alcohol, Ethyl (B): 280 mg/dL — ABNORMAL HIGH (ref <10)

## 2023-08-22 LAB — RAPID URINE DRUG SCREEN, HOSP PERFORMED
Amphetamines: NOT DETECTED
Barbiturates: NOT DETECTED
Benzodiazepines: NOT DETECTED
Cocaine: NOT DETECTED
Opiates: POSITIVE — AB
Tetrahydrocannabinol: NOT DETECTED

## 2023-08-22 MED ORDER — PANTOPRAZOLE SODIUM 40 MG IV SOLR
40.0000 mg | Freq: Once | INTRAVENOUS | Status: AC
  Administered 2023-08-22: 40 mg via INTRAVENOUS
  Filled 2023-08-22: qty 10

## 2023-08-22 MED ORDER — FOLIC ACID 1 MG PO TABS
1.0000 mg | ORAL_TABLET | Freq: Every day | ORAL | Status: DC
  Administered 2023-08-22 – 2023-09-01 (×10): 1 mg via ORAL
  Filled 2023-08-22 (×11): qty 1

## 2023-08-22 MED ORDER — LORAZEPAM 1 MG PO TABS
1.0000 mg | ORAL_TABLET | ORAL | Status: AC | PRN
  Administered 2023-08-23 (×2): 2 mg via ORAL
  Filled 2023-08-22: qty 2
  Filled 2023-08-22: qty 1
  Filled 2023-08-22: qty 3

## 2023-08-22 MED ORDER — MORPHINE SULFATE (PF) 4 MG/ML IV SOLN
6.0000 mg | Freq: Once | INTRAVENOUS | Status: AC
  Administered 2023-08-22: 6 mg via INTRAVENOUS
  Filled 2023-08-22: qty 2

## 2023-08-22 MED ORDER — THIAMINE HCL 100 MG/ML IJ SOLN
100.0000 mg | Freq: Every day | INTRAMUSCULAR | Status: DC
  Administered 2023-08-22 – 2023-08-28 (×4): 100 mg via INTRAVENOUS
  Filled 2023-08-22 (×5): qty 2

## 2023-08-22 MED ORDER — ADULT MULTIVITAMIN W/MINERALS CH
1.0000 | ORAL_TABLET | Freq: Every day | ORAL | Status: DC
  Administered 2023-08-22 – 2023-08-23 (×2): 1 via ORAL
  Filled 2023-08-22 (×2): qty 1

## 2023-08-22 MED ORDER — CHLORDIAZEPOXIDE HCL 25 MG PO CAPS
25.0000 mg | ORAL_CAPSULE | Freq: Four times a day (QID) | ORAL | Status: AC
  Administered 2023-08-22 – 2023-08-24 (×6): 25 mg via ORAL
  Filled 2023-08-22 (×6): qty 1

## 2023-08-22 MED ORDER — SODIUM CHLORIDE 0.9% FLUSH
3.0000 mL | Freq: Two times a day (BID) | INTRAVENOUS | Status: DC
  Administered 2023-08-23 – 2023-09-01 (×19): 3 mL via INTRAVENOUS

## 2023-08-22 MED ORDER — LORAZEPAM 2 MG/ML IJ SOLN
1.0000 mg | INTRAMUSCULAR | Status: AC | PRN
  Administered 2023-08-23 (×3): 2 mg via INTRAVENOUS
  Administered 2023-08-23: 1 mg via INTRAVENOUS
  Administered 2023-08-23: 3 mg via INTRAVENOUS
  Administered 2023-08-23 (×3): 2 mg via INTRAVENOUS
  Administered 2023-08-24: 3 mg via INTRAVENOUS
  Administered 2023-08-24: 4 mg via INTRAVENOUS
  Administered 2023-08-24 (×3): 2 mg via INTRAVENOUS
  Administered 2023-08-24 (×2): 1 mg via INTRAVENOUS
  Administered 2023-08-24 (×2): 2 mg via INTRAVENOUS
  Administered 2023-08-24: 1 mg via INTRAVENOUS
  Administered 2023-08-24: 2 mg via INTRAVENOUS
  Administered 2023-08-25 (×2): 3 mg via INTRAVENOUS
  Administered 2023-08-25: 2 mg via INTRAVENOUS
  Administered 2023-08-25: 1 mg via INTRAVENOUS
  Administered 2023-08-25: 2 mg via INTRAVENOUS
  Administered 2023-08-25 (×2): 3 mg via INTRAVENOUS
  Filled 2023-08-22 (×2): qty 1
  Filled 2023-08-22: qty 2
  Filled 2023-08-22: qty 1
  Filled 2023-08-22: qty 2
  Filled 2023-08-22 (×4): qty 1
  Filled 2023-08-22 (×2): qty 2
  Filled 2023-08-22 (×3): qty 1
  Filled 2023-08-22: qty 2
  Filled 2023-08-22 (×3): qty 1
  Filled 2023-08-22: qty 2
  Filled 2023-08-22 (×3): qty 1
  Filled 2023-08-22: qty 2
  Filled 2023-08-22 (×4): qty 1

## 2023-08-22 MED ORDER — THIAMINE MONONITRATE 100 MG PO TABS
100.0000 mg | ORAL_TABLET | Freq: Every day | ORAL | Status: DC
  Administered 2023-08-23 – 2023-09-01 (×7): 100 mg via ORAL
  Filled 2023-08-22 (×7): qty 1

## 2023-08-22 MED ORDER — HYDROMORPHONE HCL 1 MG/ML IJ SOLN
0.5000 mg | INTRAMUSCULAR | Status: DC | PRN
  Administered 2023-08-22 – 2023-08-24 (×9): 1 mg via INTRAVENOUS
  Filled 2023-08-22 (×9): qty 1

## 2023-08-22 MED ORDER — ACETAMINOPHEN 650 MG RE SUPP: 650.0000 mg | Freq: Four times a day (QID) | RECTAL | Status: DC | PRN

## 2023-08-22 MED ORDER — ACETAMINOPHEN 325 MG PO TABS
650.0000 mg | ORAL_TABLET | Freq: Four times a day (QID) | ORAL | Status: DC | PRN
  Administered 2023-08-27 – 2023-09-01 (×3): 650 mg via ORAL
  Filled 2023-08-22 (×3): qty 2

## 2023-08-22 MED ORDER — LACTATED RINGERS IV SOLN: INTRAVENOUS | Status: DC

## 2023-08-22 MED ORDER — OXYCODONE HCL 5 MG PO TABS: 5.0000 mg | ORAL_TABLET | ORAL | Status: DC | PRN

## 2023-08-22 MED ORDER — BICTEGRAVIR-EMTRICITAB-TENOFOV 50-200-25 MG PO TABS
1.0000 | ORAL_TABLET | Freq: Every day | ORAL | Status: DC
  Administered 2023-08-23 – 2023-09-01 (×9): 1 via ORAL
  Filled 2023-08-22 (×10): qty 1

## 2023-08-22 MED ORDER — HEPARIN SODIUM (PORCINE) 5000 UNIT/ML IJ SOLN
5000.0000 [IU] | Freq: Three times a day (TID) | INTRAMUSCULAR | Status: DC
  Administered 2023-08-22 – 2023-09-01 (×27): 5000 [IU] via SUBCUTANEOUS
  Filled 2023-08-22 (×29): qty 1

## 2023-08-22 MED ORDER — LEVETIRACETAM 500 MG PO TABS
500.0000 mg | ORAL_TABLET | Freq: Two times a day (BID) | ORAL | Status: DC
  Administered 2023-08-22 – 2023-09-01 (×19): 500 mg via ORAL
  Filled 2023-08-22 (×19): qty 1

## 2023-08-22 MED ORDER — CHLORDIAZEPOXIDE HCL 25 MG PO CAPS: 25.0000 mg | ORAL_CAPSULE | Freq: Three times a day (TID) | ORAL | Status: DC

## 2023-08-22 MED ORDER — SUCRALFATE 1 G PO TABS
1.0000 g | ORAL_TABLET | Freq: Once | ORAL | Status: AC
  Administered 2023-08-22: 1 g via ORAL
  Filled 2023-08-22: qty 1

## 2023-08-22 MED ORDER — POTASSIUM CHLORIDE 20 MEQ PO PACK
40.0000 meq | PACK | Freq: Once | ORAL | Status: AC
  Administered 2023-08-22: 40 meq via ORAL
  Filled 2023-08-22: qty 2

## 2023-08-22 MED ORDER — HYOSCYAMINE SULFATE 0.125 MG SL SUBL
0.2500 mg | SUBLINGUAL_TABLET | Freq: Once | SUBLINGUAL | Status: AC
  Administered 2023-08-22: 0.25 mg via SUBLINGUAL
  Filled 2023-08-22: qty 2

## 2023-08-22 MED ORDER — LOPERAMIDE HCL 2 MG PO CAPS: 2.0000 mg | ORAL_CAPSULE | ORAL | Status: AC | PRN

## 2023-08-22 MED ORDER — CHLORDIAZEPOXIDE HCL 25 MG PO CAPS: 25.0000 mg | ORAL_CAPSULE | Freq: Every day | ORAL | Status: DC

## 2023-08-22 MED ORDER — CHLORDIAZEPOXIDE HCL 25 MG PO CAPS: 25.0000 mg | ORAL_CAPSULE | ORAL | Status: DC

## 2023-08-22 MED ORDER — MORPHINE SULFATE (PF) 4 MG/ML IV SOLN
4.0000 mg | Freq: Once | INTRAVENOUS | Status: AC
  Administered 2023-08-22: 4 mg via INTRAVENOUS
  Filled 2023-08-22: qty 1

## 2023-08-22 MED ORDER — LORAZEPAM 2 MG/ML IJ SOLN
1.0000 mg | Freq: Once | INTRAMUSCULAR | Status: AC
  Administered 2023-08-22: 1 mg via INTRAVENOUS
  Filled 2023-08-22: qty 1

## 2023-08-22 MED ORDER — ONDANSETRON HCL 4 MG/2ML IJ SOLN
4.0000 mg | Freq: Four times a day (QID) | INTRAMUSCULAR | Status: DC | PRN
  Administered 2023-08-22 – 2023-09-01 (×12): 4 mg via INTRAVENOUS
  Filled 2023-08-22 (×13): qty 2

## 2023-08-22 MED ORDER — ONDANSETRON HCL 4 MG PO TABS
4.0000 mg | ORAL_TABLET | Freq: Four times a day (QID) | ORAL | Status: DC | PRN
  Filled 2023-08-22 (×3): qty 1

## 2023-08-22 MED ORDER — LACTATED RINGERS IV BOLUS
1000.0000 mL | Freq: Once | INTRAVENOUS | Status: AC
  Administered 2023-08-22: 1000 mL via INTRAVENOUS

## 2023-08-22 MED ORDER — ALUM & MAG HYDROXIDE-SIMETH 200-200-20 MG/5ML PO SUSP
30.0000 mL | Freq: Once | ORAL | Status: AC
  Administered 2023-08-22: 30 mL via ORAL
  Filled 2023-08-22: qty 30

## 2023-08-22 MED ORDER — CALCIUM GLUCONATE-NACL 1-0.675 GM/50ML-% IV SOLN
1.0000 g | Freq: Once | INTRAVENOUS | Status: AC
  Administered 2023-08-22: 1000 mg via INTRAVENOUS
  Filled 2023-08-22: qty 50

## 2023-08-22 MED ORDER — METOCLOPRAMIDE HCL 5 MG/ML IJ SOLN
5.0000 mg | Freq: Once | INTRAMUSCULAR | Status: AC
  Administered 2023-08-22: 5 mg via INTRAVENOUS
  Filled 2023-08-22: qty 2

## 2023-08-22 NOTE — ED Triage Notes (Signed)
Patient brought in by EMS from [facility] for ABD pain and alcohol intoxication. EMS started 20g in R forearm and gave 4mg  of Zofran.  140/100 100 95% RA CBG: 178

## 2023-08-22 NOTE — H&P (Addendum)
History and Physical    Gary [name] XBM:[id number] DOB: [birth year] DOA: 08/22/2023  PCP: Patient, No Pcp Per  Patient coming from: Homeless  I have personally briefly reviewed patient's old medical records in [facility]  Chief Complaint: Nausea, vomiting, abdominal pain  HPI: Gary [name] is a 37 y.o. male with medical history significant for alcohol use disorder with hx of withdrawal seizures, polysubstance use, recurrent alcohol associated pancreatitis, HIV, PTSD, anxiety, bipolar disorder, homelessness who presented to the ED for evaluation of abdominal pain associate with nausea and vomiting.  Patient states that he drinks about two fifths of liquor daily.  Since he could not get any liquor today being a Sunday he drank two 40 oz malt liquors.  Afterwards he says he began to have severe epigastric pain associated with nausea and vomiting.  This felt similar to his prior pancreatitis episodes.  He reports subjective fevers, chills, diaphoresis.  Patient denies any injection drug use, cocaine use.  He reports smoking about 2 cigarettes daily.  He says he is currently unhoused.  He says he is supposed to take Keppra for history of seizures and Biktarvy for HIV but has not been taking these for the last month since he reportedly got out of jail.  ED Course  Labs/Imaging on admission: I have personally reviewed following labs and imaging studies.  Initial vitals showed BP 142/105, pulse 91, RR 18, temp 97.7 F, SpO2 95% on room air.  Serum ethanol 280.  UDS positive for opiates (obtained after receiving morphine in the ED).  Additional labs (CMP, CBC, lipase) have been pending due to repeat hemolyzed samples.  CT abdomen/pelvis without contrast showed findings consistent with acute pancreatitis.  Markedly thickened and inflamed proximal portion of the duodenum likely secondary to adjacent pancreatitis noted.  Mild to moderate severity gallbladder distention without evidence of  gallstones or biliary dilatation.  Hepatic steatosis seen.  Mild to moderate severity diffuse urinary bladder wall thickening also seen.  Patient was given 1 L LR, IV morphine 4 and 6 mg, IV Reglan, IV Protonix, IV Ativan 1 mg.  The hospitalist service was consulted to admit for further evaluation and management.  Review of Systems: All systems reviewed and are negative except as documented in history of present illness above.   Past Medical History:  Diagnosis Date   Alcohol abuse    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   Anxiety    Bipolar 2 disorder (HCC)    HIV (human immunodeficiency virus infection) (HCC)    Pancreatitis    PTSD (post-traumatic stress disorder)    Schizophrenia (HCC)    Seizures (HCC)    Subdural hematoma (HCC)     Past Surgical History:  Procedure Laterality Date   BIOPSY  04/19/2022   Procedure: BIOPSY;  Surgeon: Lemar Lofty., MD;  Location: [facility] ENDOSCOPY;  Service: Gastroenterology;;   ENTEROSCOPY N/A 04/19/2022   Procedure: ENTEROSCOPY;  Surgeon: Lemar Lofty., MD;  Location: [facility] ENDOSCOPY;  Service: Gastroenterology;  Laterality: N/A;   INCISION AND DRAINAGE PERIRECTAL ABSCESS N/A 09/24/2016   Procedure: IRRIGATION AND DEBRIDEMENT PERIRECTAL ABSCESS;  Surgeon: Ricarda Frame, MD;  Location: [facility] ORS;  Service: General;  Laterality: N/A;   none      Social History:  reports that he has been smoking cigarettes. He has never used smokeless tobacco. He reports current alcohol use of about 105.0 standard drinks of alcohol per week. He reports current drug use. Drug: Methamphetamines.  Allergies  Allergen Reactions   Tegretol [Carbamazepine]  Other (See Comments)    Caused vertigo for 2 days after taking it   Caffeine Palpitations    Family History  Problem Relation Age of Onset   Alcohol abuse Mother    Alcohol abuse Father    Colon cancer Other    Other Other    Cancer Other      Prior to Admission medications    Medication Sig Start Date End Date Taking? Authorizing Provider  levETIRAcetam (KEPPRA) 500 MG tablet Take 1 tablet (500 mg total) by mouth 2 (two) times daily. 08/03/23   Dione Booze, MD  ondansetron (ZOFRAN-ODT) 8 MG disintegrating tablet Take 1 tablet by mouth every 8 hours as needed for nausea or vomiting. Patient not taking: Reported on 05/26/2023 05/21/23   Jacalyn Lefevre, MD  pantoprazole (PROTONIX) 40 MG tablet Take 1 tablet (40 mg total) by mouth daily. 08/03/23   Dione Booze, MD  potassium chloride SA (KLOR-CON M) 20 MEQ tablet Take 1 tablet (20 mEq total) by mouth 2 (two) times daily. 08/03/23   Dione Booze, MD  famotidine (PEPCID) 20 MG tablet Take 1 tablet (20 mg total) by mouth 2 (two) times daily. Patient not taking: Reported on 06/01/2019 05/14/19 06/02/19  Benjiman Core, MD    Physical Exam: Vitals:   08/22/23 2135 08/22/23 2220 08/22/23 2230 08/22/23 2300  BP:   122/82 128/83  Pulse: 100 (!) 107 79 88  Resp:  16 16 17   Temp:      TempSrc:      SpO2: 96% 93% 99% 96%  Weight:      Height:       Constitutional: Chronically ill-appearing disheveled man resting in bed.  Appears fatigued. Eyes: EOMI, lids and conjunctivae normal ENMT: Mucous membranes are dry. Posterior pharynx clear of any exudate or lesions.Normal dentition.  Neck: normal, supple, no masses. Respiratory: clear to auscultation bilaterally, no wheezing, no crackles. Normal respiratory effort. No accessory muscle use.  Cardiovascular: Regular rate and rhythm, no murmurs / rubs / gallops. No extremity edema. 2+ pedal pulses. Abdomen: Epigastric tenderness, no masses palpated.  Musculoskeletal: no clubbing / cyanosis. No joint deformity upper and lower extremities. Good ROM, no contractures. Normal muscle tone.  Skin: no rashes, ulcers. No induration Neurologic: Sensation intact. Strength 5/5 in all 4.  Psychiatric: Alert and oriented x 3.   EKG: Personally reviewed. Sinus rhythm, rate 91, artifact  present.  Rate is slower when compared to previous.  Assessment/Plan Principal Problem:   Acute alcoholic pancreatitis Active Problems:   Alcohol use disorder, severe, dependence (HCC)   History of seizures   HIV disease (HCC)   Pancytopenia (HCC)   Gary [name] is a 37 y.o. male with medical history significant for alcohol use disorder with hx of withdrawal seizures, polysubstance use, recurrent alcohol associated pancreatitis, HIV, PTSD, anxiety, bipolar disorder, homelessness who is admitted with alcohol induced acute pancreatitis.  Assessment and Plan: Acute alcohol induced pancreatitis: As seen on CT imaging, recurrent issue due to ongoing alcohol use.  Lipase 699.  AST 66 otherwise LFTs within normal limits. -Keep n.p.o. except for sips with meds as tolerated -Continue IV fluid hydration overnight -Continue IV analgesics and antiemetics prn -Follow CMP, lipase  Severe alcohol use disorder: Very high risk for severe withdrawals.  Last drink was 10/13 a.m. prior to admission.  Serum ethanol level 280. -Admit to stepdown unit -Start Librium taper -CIWA protocol with Ativan prn -Folate, thiamine, MVM  HIV: CD4 248, HIV RNA 47,000 on 07/18/2023.  Has not  been taking meds for about 1 month.  Restarted on Biktarvy.  Needs to follow-up with ID.  History of seizures: Start back on Keppra 500 mg BID.  Hypocalcemia: Will supplement.  Hypokalemia: Supplement, recheck labs in AM.  Pancytopenia: Persistent pancytopenia, possibly related to HIV and alcohol use.  No indication for PRBC or platelet transfusion.  Bipolar disorder/PTSD/anxiety: Does not appear to be on any medications for mood/behavioral health.   DVT prophylaxis: heparin injection 5,000 Units Start: 08/22/23 2200 Code Status: Full code Family Communication: Patient states he has no close family, friends, or acquaintances Disposition Plan: Homeless, dispo pending clinical progress Consults called: None Severity  of Illness: The appropriate patient status for this patient is INPATIENT. Inpatient status is judged to be reasonable and necessary in order to provide the required intensity of service to ensure the patient's safety. The patient's presenting symptoms, physical exam findings, and initial radiographic and laboratory data in the context of their chronic comorbidities is felt to place them at high risk for further clinical deterioration. Furthermore, it is not anticipated that the patient will be medically stable for discharge from the hospital within 2 midnights of admission.   * I certify that at the point of admission it is my clinical judgment that the patient will require inpatient hospital care spanning beyond 2 midnights from the point of admission due to high intensity of service, high risk for further deterioration and high frequency of surveillance required.Darreld Mclean MD [facility]  If 7PM-7AM, please contact night-coverage [email or web address]  08/22/2023, 11:19 PM

## 2023-08-22 NOTE — Hospital Course (Addendum)
37 y.o. male with medical history significant for alcohol use disorder with hx of withdrawal seizures, polysubstance use, recurrent alcohol associated pancreatitis, HIV, PTSD, anxiety, bipolar disorder, homelessness who presented to ED due to 9/13 with nausea vomiting abdominal pain.  He drinks about 2/5 of liquor daily and could not get any Lasix since being a Sunday so he drank 2 of 40 ounce malt liquor then started having severe epigastric pain nausea vomiting In the ED vitals stable internal to 80 UDS positive for opiate, labs showed hyponatremia, lipase 699 albumin 3.3 WBC 2.4 hemoglobin 10.2 plt 110. CT abdomen/pelvis without contrast>>>acute pancreatitis. Markedly thickened and inflamed proximal portion of the duodenum likely secondary to adjacent pancreatitis noted. Mild to moderate severity gallbladder distention without evidence of gallstones or biliary dilatation. Hepatic steatosis seen. Mild to moderate severity diffuse urinary bladder wall thickening also seen.  Patient is admitted for acute pancreatitis, alcohol abuse and alcohol withdrawal to SDU. Due to ongoing agitation and behavioral issues psychiatry was consulted, subsequently PCCM was consulted due to worsening alcohol withdrawal despite CIWA protocol and frequent benzodiazepine doses, phenobarbital taper initiated.  Patient has completed taper, at this time mentation has improved he is diet was slowly advanced and tolerated diet well no nausea vomiting had some chronic abdominal pain, at this time he is mobilizing well worked with PT OT and did well.  He is stable for discharge.  TOC has seen the patient to help with homeless shelter and hospitalization.  Patient is requesting for discharge

## 2023-08-22 NOTE — ED Provider Notes (Signed)
[facility] AT [facility] Provider Note   CSN: [id number] Arrival date & time: 08/22/23  1613     History  Chief Complaint  Patient presents with   Abdominal Pain   Alcohol Intoxication    Gary [name] is a 37 y.o. male.  Gary [name] is a 37 yo M with PMH alcohol use disorder, PTSD, schizophrenia, HIV presenting via EMS for evaluation of abdominal pain. He states the abdominal pain started overnight and believes it is his pancreatitis acting up. He reports severe pain, 10/10, in the central abdomen and he also reports chest and back pain that started overnight as well. He started vomiting overnight and does report a small amount of blood. He reports continued alcohol consumption, two beers this morning. He denies seizures or loss of consciousness.   Abdominal Pain Associated symptoms: chest pain, diarrhea, nausea and vomiting   Associated symptoms: no chills, no constipation, no dysuria, no fever and no shortness of breath   Alcohol Intoxication Associated symptoms include chest pain, abdominal pain and headaches. Pertinent negatives include no shortness of breath.       Home Medications Prior to Admission medications   Medication Sig Start Date End Date Taking? Authorizing Provider  levETIRAcetam (KEPPRA) 500 MG tablet Take 1 tablet (500 mg total) by mouth 2 (two) times daily. 08/03/23   Dione Booze, MD  ondansetron (ZOFRAN-ODT) 8 MG disintegrating tablet Take 1 tablet by mouth every 8 hours as needed for nausea or vomiting. Patient not taking: Reported on 05/26/2023 05/21/23   Jacalyn Lefevre, MD  pantoprazole (PROTONIX) 40 MG tablet Take 1 tablet (40 mg total) by mouth daily. 08/03/23   Dione Booze, MD  potassium chloride SA (KLOR-CON M) 20 MEQ tablet Take 1 tablet (20 mEq total) by mouth 2 (two) times daily. 08/03/23   Dione Booze, MD  famotidine (PEPCID) 20 MG tablet Take 1 tablet (20 mg total) by mouth 2 (two) times daily. Patient not  taking: Reported on 06/01/2019 05/14/19 06/02/19  Benjiman Core, MD      Allergies    Tegretol [carbamazepine] and Caffeine    Review of Systems   Review of Systems  Constitutional:  Negative for chills and fever.  Respiratory:  Negative for chest tightness and shortness of breath.   Cardiovascular:  Positive for chest pain. Negative for palpitations.  Gastrointestinal:  Positive for abdominal pain, diarrhea, nausea and vomiting. Negative for abdominal distention, blood in stool and constipation.  Genitourinary:  Negative for dysuria and flank pain.  Musculoskeletal:  Positive for back pain.  Neurological:  Positive for headaches. Negative for dizziness, seizures, syncope and light-headedness.  Psychiatric/Behavioral:  Negative for hallucinations.     Physical Exam Updated Vital Signs BP 122/78   Pulse 100   Temp 98.6 F (37 C) (Oral)   Resp 16   Ht 6' (1.829 m)   Wt 66 kg   SpO2 96%   BMI 19.73 kg/m  Physical Exam Constitutional:      Appearance: He is well-developed. He is ill-appearing, toxic-appearing and diaphoretic.  Cardiovascular:     Rate and Rhythm: Normal rate and regular rhythm.     Heart sounds: Normal heart sounds.  Pulmonary:     Effort: Pulmonary effort is normal. No respiratory distress.     Breath sounds: Normal breath sounds.  Abdominal:     General: Abdomen is flat. Bowel sounds are normal. There is no distension.     Palpations: Abdomen is soft. There is no fluid  wave.     Tenderness: There is generalized abdominal tenderness. There is guarding.  Skin:    General: Skin is warm.  Neurological:     General: No focal deficit present.     Mental Status: He is alert and oriented to person, place, and time.  Psychiatric:        Mood and Affect: Mood normal.        Behavior: Behavior normal.     ED Results / Procedures / Treatments   Labs (all labs ordered are listed, but only abnormal results are displayed) Labs Reviewed  ETHANOL - Abnormal;  Notable for the following components:      Result Value   Alcohol, Ethyl (B) 280 (*)    All other components within normal limits  RAPID URINE DRUG SCREEN, HOSP PERFORMED - Abnormal; Notable for the following components:   Opiates POSITIVE (*)    All other components within normal limits  CBC WITH DIFFERENTIAL/PLATELET  COMPREHENSIVE METABOLIC PANEL  LIPASE, BLOOD  CBC WITH DIFFERENTIAL/PLATELET    EKG EKG Interpretation Date/Time:  Sunday August 22 2023 16:30:42 EDT Ventricular Rate:  91 PR Interval:    QRS Duration:  92 QT Interval:  377 QTC Calculation: 464 R Axis:   65  Text Interpretation: Normal sinus rhythm arifact Confirmed by Lorre Nick ([id number]) on 08/22/2023 4:46:26 PM  Radiology CT ABDOMEN PELVIS WO CONTRAST  Result Date: 08/22/2023 CLINICAL DATA:  Abdominal pain. EXAM: CT ABDOMEN AND PELVIS WITHOUT CONTRAST TECHNIQUE: Multidetector CT imaging of the abdomen and pelvis was performed following the standard protocol without IV contrast. RADIATION DOSE REDUCTION: This exam was performed according to the departmental dose-optimization program which includes automated exposure control, adjustment of the mA and/or kV according to patient size and/or use of iterative reconstruction technique. COMPARISON:  May 24, 2023 FINDINGS: Lower chest: Mild linear atelectasis is seen within the left lung base. Hepatobiliary: There is diffuse fatty infiltration of the liver parenchyma. No focal liver abnormality is seen. There is mild to moderate severity gallbladder distension without evidence of gallstones, gallbladder wall thickening, or biliary dilatation. Pancreas: The head of the pancreas is enlarged, without definite evidence of an underlying mass. A moderate to marked amount of peripancreatic inflammatory fat stranding is also seen within this region. The tail of the pancreas is truncated. There is no evidence of pancreatic ductal dilatation. Spleen: No splenic injury or perisplenic  hematoma. Adrenals/Urinary Tract: Adrenal glands are unremarkable. Kidneys are normal, without renal calculi, focal lesion, or hydronephrosis. There is mild to moderate severity diffuse urinary bladder wall thickening. Stomach/Bowel: Stomach is within normal limits. The proximal portion of the duodenum, involving the segment in between the gallbladder and head of the pancreas, is markedly thickened and inflamed. The appendix appears normal. No evidence of bowel dilatation. Vascular/Lymphatic: Very mild aortic atherosclerosis. No enlarged abdominal or pelvic lymph nodes. Reproductive: The prostate gland is mildly enlarged. Other: No abdominal wall hernia or abnormality. A trace amount of posterior pelvic free fluid is noted. Musculoskeletal: No acute or significant osseous findings. IMPRESSION: 1. Findings consistent with acute pancreatitis. Correlation with laboratory values is recommended. 2. Markedly thickened and inflamed proximal portion of the duodenum, likely secondary to adjacent pancreatitis. 3. Mild to moderate severity gallbladder distension without evidence of gallstones or biliary dilatation. 4. Hepatic steatosis. 5. Mild to moderate severity diffuse urinary bladder wall thickening, which may be secondary to chronic bladder outlet obstruction. Correlation with urinalysis is recommended to exclude acute cystitis. 6. Trace amount of posterior pelvic free  fluid. 7. Aortic atherosclerosis. Aortic Atherosclerosis (ICD10-I70.0). Electronically Signed   By: Aram Candela M.D.   On: 08/22/2023 20:39    Procedures Procedures    Medications Ordered in ED Medications  morphine (PF) 4 MG/ML injection 4 mg (4 mg Intravenous Given 08/22/23 1719)  lactated ringers bolus 1,000 mL (0 mLs Intravenous Stopped 08/22/23 1849)  metoCLOPramide (REGLAN) injection 5 mg (5 mg Intravenous Given 08/22/23 1719)  pantoprazole (PROTONIX) injection 40 mg (40 mg Intravenous Given 08/22/23 1925)  sucralfate (CARAFATE)  tablet 1 g (1 g Oral Given 08/22/23 1925)  alum & mag hydroxide-simeth (MAALOX/MYLANTA) 200-200-20 MG/5ML suspension 30 mL (30 mLs Oral Given 08/22/23 1925)  hyoscyamine (LEVSIN SL) SL tablet 0.25 mg (0.25 mg Sublingual Given 08/22/23 1925)  morphine (PF) 4 MG/ML injection 6 mg (6 mg Intravenous Given 08/22/23 2009)  LORazepam (ATIVAN) injection 1 mg (1 mg Intravenous Given 08/22/23 2005)    ED Course/ Medical Decision Making/ A&P                                 Medical Decision Making Mr Yannuzzi presented with ethanol intoxication and acute abdominal pain that he stated felt similar to previous episodes of pancreatitis. On arrival, his vitals were stable. He exhibited acute distress secondary to pain and emesis, curled up in bed and resistant to abdominal exam due to pain. Pain was controlled with morphine. IV fluids were started. GI cocktail was provided. Ethanol was elevated, 280. CT scan was obtained that revealed acute pancreatitis. Further labs including CBC, CMP, Lipase are pending at this moment due to repeated hemolyzed samples. Pt was re-evaluated multiple times and in setting of CT with pancreatitis is agreeable to hospital admission. Ativan was given per concern of early withdrawal with his last drink this morning. He is in a much more stable state than before, able to converse and even make a joke. He expressed a desire to stop drinking. Will continue to monitor this patient's lab work. Plan to admit this patient to hospital for management of acute pancreatitis.  Amount and/or Complexity of Data Reviewed Labs: ordered. Radiology: ordered.  Risk OTC drugs. Prescription drug management.          Final Clinical Impression(s) / ED Diagnoses Final diagnoses:  Acute pancreatitis, unspecified complication status, unspecified pancreatitis type  Alcoholic intoxication with complication [facility])    Rx / DC Orders ED Discharge Orders     None         Katheran James,  DO 08/22/23 2148    Lorre Nick, MD 08/25/23 [phone]

## 2023-08-22 NOTE — ED Notes (Signed)
Pt's O2 60% on RA upon entry to the room. Pt placed on 2lpm [place], O2 increased to 95%.

## 2023-08-22 NOTE — ED Provider Notes (Signed)
I saw and evaluated the patient, reviewed the resident's note and I agree with the findings and plan.  EKG Interpretation Date/Time:  Sunday August 22 2023 16:30:42 EDT Ventricular Rate:  91 PR Interval:    QRS Duration:  92 QT Interval:  377 QTC Calculation: 464 R Axis:   65  Text Interpretation: Normal sinus rhythm arifact Confirmed by Lorre Nick ([id number]) on 08/22/2023 4:44:17 PM   37 year old male well-known to the department history of alcohol abuse.  Also history of chronic pancreatitis.  Here with emesis.  Will check labs, give IV fluids and analgesics and reassess   Lorre Nick, MD 08/22/23 1659

## 2023-08-23 ENCOUNTER — Other Ambulatory Visit (HOSPITAL_COMMUNITY): Payer: Self-pay

## 2023-08-23 DIAGNOSIS — B20 Human immunodeficiency virus [HIV] disease: Secondary | ICD-10-CM

## 2023-08-23 DIAGNOSIS — K852 Alcohol induced acute pancreatitis without necrosis or infection: Secondary | ICD-10-CM | POA: Diagnosis not present

## 2023-08-23 DIAGNOSIS — F1020 Alcohol dependence, uncomplicated: Secondary | ICD-10-CM

## 2023-08-23 LAB — CBC
HCT: 30.6 % — ABNORMAL LOW (ref 39.0–52.0)
Hemoglobin: 10.2 g/dL — ABNORMAL LOW (ref 13.0–17.0)
MCH: 33.7 pg (ref 26.0–34.0)
MCHC: 33.3 g/dL (ref 30.0–36.0)
MCV: 101 fL — ABNORMAL HIGH (ref 80.0–100.0)
Platelets: 110 10*3/uL — ABNORMAL LOW (ref 150–400)
RBC: 3.03 MIL/uL — ABNORMAL LOW (ref 4.22–5.81)
RDW: 16.4 % — ABNORMAL HIGH (ref 11.5–15.5)
WBC: 2.4 10*3/uL — ABNORMAL LOW (ref 4.0–10.5)
nRBC: 0 % (ref 0.0–0.2)

## 2023-08-23 LAB — COMPREHENSIVE METABOLIC PANEL
ALT: 41 U/L (ref 0–44)
AST: 56 U/L — ABNORMAL HIGH (ref 15–41)
Albumin: 3.3 g/dL — ABNORMAL LOW (ref 3.5–5.0)
Alkaline Phosphatase: 65 U/L (ref 38–126)
Anion gap: 8 (ref 5–15)
BUN: 6 mg/dL (ref 6–20)
CO2: 26 mmol/L (ref 22–32)
Calcium: 8 mg/dL — ABNORMAL LOW (ref 8.9–10.3)
Chloride: 98 mmol/L (ref 98–111)
Creatinine, Ser: 0.46 mg/dL — ABNORMAL LOW (ref 0.61–1.24)
GFR, Estimated: >60 mL/min (ref >60)
Glucose, Bld: 86 mg/dL (ref 70–99)
Potassium: 3.7 mmol/L (ref 3.5–5.1)
Sodium: 132 mmol/L — ABNORMAL LOW (ref 135–145)
Total Bilirubin: 1.1 mg/dL (ref 0.3–1.2)
Total Protein: 6.2 g/dL — ABNORMAL LOW (ref 6.5–8.1)

## 2023-08-23 LAB — URINALYSIS, ROUTINE W REFLEX MICROSCOPIC
Bilirubin Urine: NEGATIVE
Glucose, UA: NEGATIVE mg/dL
Hgb urine dipstick: NEGATIVE
Ketones, ur: 20 mg/dL — AB
Leukocytes,Ua: NEGATIVE
Nitrite: NEGATIVE
Protein, ur: NEGATIVE mg/dL
Specific Gravity, Urine: 1.019 (ref 1.005–1.030)
pH: 7 (ref 5.0–8.0)

## 2023-08-23 LAB — MAGNESIUM: Magnesium: 1.6 mg/dL — ABNORMAL LOW (ref 1.7–2.4)

## 2023-08-23 LAB — MRSA NEXT GEN BY PCR, NASAL: MRSA by PCR Next Gen: NOT DETECTED

## 2023-08-23 MED ORDER — CHLORHEXIDINE GLUCONATE CLOTH 2 % EX PADS
6.0000 | MEDICATED_PAD | Freq: Every day | CUTANEOUS | Status: DC
  Administered 2023-08-23 – 2023-08-31 (×8): 6 via TOPICAL

## 2023-08-23 MED ORDER — LACTATED RINGERS IV SOLN: INTRAVENOUS | Status: DC

## 2023-08-23 MED ORDER — ADULT MULTIVITAMIN W/MINERALS CH
1.0000 | ORAL_TABLET | Freq: Every day | ORAL | Status: DC
  Administered 2023-08-24 – 2023-08-31 (×8): 1 via ORAL
  Filled 2023-08-23 (×8): qty 1

## 2023-08-23 MED ORDER — MAGNESIUM SULFATE 2 GM/50ML IV SOLN
2.0000 g | Freq: Once | INTRAVENOUS | Status: AC
  Administered 2023-08-23: 2 g via INTRAVENOUS
  Filled 2023-08-23: qty 50

## 2023-08-23 MED ORDER — ORAL CARE MOUTH RINSE: 15.0000 mL | OROMUCOSAL | Status: DC | PRN

## 2023-08-23 NOTE — Plan of Care (Signed)

## 2023-08-23 NOTE — Care Management (Addendum)
Transition of Care [facility]) - Inpatient Brief Assessment   Patient Details  Name: Gary [name] MRN: [id number] Date of Birth: 1986/07/03  Transition of Care [facility]) CM/SW Contact:    Lavenia Atlas, RN Phone Number: 08/23/2023, 6:14 PM   Clinical Narrative: Per chart review patient is known to [facility], currently in [facility] SDU for alcoholic pancreatitis. Patient previous ED visits and admissions on: 8/2, 8/27, 8/28, 9/6, 9/20, 9/23, 9/27, 9/29, 9/30 as patient has been provided with SA resources and social services on previous admissions. Received TOC consult for medication assistance, SA resources, HIV infected patient and homeless. Patient has medical insurance and does not qualify for [facility] program. Per chart review patient has $0 copay for Biktarvy. This RNCM will attach SA, social services, and PCP resources to AVS.   Transportation at discharge: public/bus pass  TOC will continue to follow for discharge needs.  Transition of Care Asessment: Insurance and Status: Insurance coverage has been reviewed Patient has primary care physician: No (Resources attached to AVS) Home environment has been reviewed: homeless/shelter Prior level of function:: independent Prior/Current Home Services: No current home services Social Determinants of Health Reivew: SDOH reviewed interventions complete Readmission risk has been reviewed: Yes Transition of care needs: transition of care needs identified, TOC will continue to follow

## 2023-08-23 NOTE — TOC Benefit Eligibility Note (Signed)
Patient Product/process development scientist completed.    The patient is insured through [employer] [place].     Ran test claim for Biktary 50-200-25 mg and the current 30 day co-pay is $0.00.   This test claim was processed through [facility]- copay amounts may vary at other pharmacies due to pharmacy/plan contracts, or as the patient moves through the different stages of their insurance plan.     Roland Earl, CPHT Pharmacy Technician III Certified Patient Advocate [facility] Patient Advocate Team Direct Number: [phone]  Fax: [phone]

## 2023-08-23 NOTE — ED Notes (Signed)
ED TO INPATIENT HANDOFF REPORT  ED Nurse Name and Phone #: Durene Cal RN   S Name/Age/Gender [name] 37 y.o. male Room/Bed: WA18/WA18  Code Status   Code Status: Full Code  Home/SNF/Other Home Patient oriented to: self, place, time, and situation Is this baseline? Yes   Triage Complete: Triage complete  Chief Complaint Acute alcoholic pancreatitis [K85.20]  Triage Note Patient brought in by EMS from [facility] for ABD pain and alcohol intoxication. EMS started 20g in R forearm and gave 4mg  of Zofran.  140/100 100 95% RA CBG: 178   Allergies Allergies  Allergen Reactions   Tegretol [Carbamazepine] Other (See Comments)    Caused vertigo for 2 days after taking it   Caffeine Palpitations    Level of Care/Admitting Diagnosis ED Disposition     ED Disposition  Admit   Condition  --   Comment  Hospital Area: [facility] [[id number]]  Level of Care: Stepdown [14]  Admit to SDU based on following criteria: Severe physiological/psychological symptoms:  Any diagnosis requiring assessment & intervention at least every 4 hours on an ongoing basis to obtain desired patient outcomes including stability and rehabilitation  May admit patient to [name] or [facility] if equivalent level of care is available:: No  Covid Evaluation: Asymptomatic - no recent exposure (last 10 days) testing not required  Diagnosis: Acute alcoholic pancreatitis [725366]  Admitting Physician: Charlsie Quest [[id number]]  Attending Physician: Charlsie Quest [[id number]]  Certification:: I certify this patient will need inpatient services for at least 2 midnights  Expected Medical Readiness: 08/26/2023          B Medical/Surgery History Past Medical History:  Diagnosis Date   Alcohol abuse    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   Anxiety    Bipolar 2 disorder (HCC)    HIV (human immunodeficiency virus infection) (HCC)    Pancreatitis    PTSD (post-traumatic stress  disorder)    Schizophrenia (HCC)    Seizures (HCC)    Subdural hematoma (HCC)    Past Surgical History:  Procedure Laterality Date   BIOPSY  04/19/2022   Procedure: BIOPSY;  Surgeon: Lemar Lofty., MD;  Location: [facility] ENDOSCOPY;  Service: Gastroenterology;;   ENTEROSCOPY N/A 04/19/2022   Procedure: ENTEROSCOPY;  Surgeon: Lemar Lofty., MD;  Location: [facility] ENDOSCOPY;  Service: Gastroenterology;  Laterality: N/A;   INCISION AND DRAINAGE PERIRECTAL ABSCESS N/A 09/24/2016   Procedure: IRRIGATION AND DEBRIDEMENT PERIRECTAL ABSCESS;  Surgeon: Ricarda Frame, MD;  Location: [facility] ORS;  Service: General;  Laterality: N/A;   none       A IV Location/Drains/Wounds Patient Lines/Drains/Airways Status     Active Line/Drains/Airways     Name Placement date Placement time Site Days   Peripheral IV 08/22/23 20 G 1" Right;Posterior Forearm 08/22/23  1624  Forearm  1            Intake/Output Last 24 hours  Intake/Output Summary (Last 24 hours) at 08/23/2023 0402 Last data filed at 08/22/2023 1849 Gross per 24 hour  Intake 1000 ml  Output --  Net 1000 ml    Labs/Imaging Results for orders placed or performed during the hospital encounter of 08/22/23 (from the past 48 hour(s))  Ethanol     Status: Abnormal   Collection Time: 08/22/23  5:23 PM  Result Value Ref Range   Alcohol, Ethyl (B) 280 (H) <10 mg/dL    Comment: (NOTE) Lowest detectable limit for serum alcohol is 10 mg/dL.  For medical purposes  only. Performed at [facility], [address], [place], [place] [address]   Rapid urine drug screen (hospital performed)     Status: Abnormal   Collection Time: 08/22/23  7:15 PM  Result Value Ref Range   Opiates POSITIVE (A) NONE DETECTED   Cocaine NONE DETECTED NONE DETECTED   Benzodiazepines NONE DETECTED NONE DETECTED   Amphetamines NONE DETECTED NONE DETECTED   Tetrahydrocannabinol NONE DETECTED NONE DETECTED   Barbiturates NONE DETECTED  NONE DETECTED    Comment: (NOTE) DRUG SCREEN FOR MEDICAL PURPOSES ONLY.  IF CONFIRMATION IS NEEDED FOR ANY PURPOSE, NOTIFY LAB WITHIN 5 DAYS.  LOWEST DETECTABLE LIMITS FOR URINE DRUG SCREEN Drug Class                     Cutoff (ng/mL) Amphetamine and metabolites    1000 Barbiturate and metabolites    200 Benzodiazepine                 200 Opiates and metabolites        300 Cocaine and metabolites        300 THC                            50 Performed at [facility], [address], [place], [place] [address]   Comprehensive metabolic panel     Status: Abnormal   Collection Time: 08/22/23  9:59 PM  Result Value Ref Range   Sodium 133 (L) 135 - 145 mmol/L   Potassium 3.4 (L) 3.5 - 5.1 mmol/L   Chloride 98 98 - 111 mmol/L   CO2 24 22 - 32 mmol/L   Glucose, Bld 116 (H) 70 - 99 mg/dL    Comment: Glucose reference range applies only to samples taken after fasting for at least 8 hours.   BUN 7 6 - 20 mg/dL   Creatinine, Ser 5.78 (L) 0.61 - 1.24 mg/dL   Calcium 7.6 (L) 8.9 - 10.3 mg/dL   Total Protein 6.2 (L) 6.5 - 8.1 g/dL   Albumin 3.2 (L) 3.5 - 5.0 g/dL   AST 66 (H) 15 - 41 U/L   ALT 44 0 - 44 U/L   Alkaline Phosphatase 68 38 - 126 U/L   Total Bilirubin 0.8 0.3 - 1.2 mg/dL   GFR, Estimated >46 >96 mL/min    Comment: (NOTE) Calculated using the CKD-EPI Creatinine Equation (2021)    Anion gap 11 5 - 15    Comment: Performed at [facility], [address] [place], [place] [address]  Lipase, blood     Status: Abnormal   Collection Time: 08/22/23  9:59 PM  Result Value Ref Range   Lipase 699 (H) 11 - 51 U/L    Comment: RESULT CONFIRMED BY MANUAL DILUTION Performed at [facility], [address], [place], [place] [address]   CBC with Differential/Platelet     Status: Abnormal   Collection Time: 08/22/23  9:59 PM  Result Value Ref Range   WBC 2.5 (L) 4.0 - 10.5 K/uL   RBC 3.03 (L) 4.22 - 5.81 MIL/uL   Hemoglobin  10.1 (L) 13.0 - 17.0 g/dL   HCT 40.1 (L) 02.7 - 25.3 %   MCV 99.0 80.0 - 100.0 fL   MCH 33.3 26.0 - 34.0 pg   MCHC 33.7 30.0 - 36.0 g/dL   RDW 66.4 (H) 40.3 - 47.4 %   Platelets 102 (  L) 150 - 400 K/uL   nRBC 0.0 0.0 - 0.2 %   Neutrophils Relative % 64 %   Neutro Abs 1.6 (L) 1.7 - 7.7 K/uL   Lymphocytes Relative 25 %   Lymphs Abs 0.6 (L) 0.7 - 4.0 K/uL   Monocytes Relative 10 %   Monocytes Absolute 0.3 0.1 - 1.0 K/uL   Eosinophils Relative 0 %   Eosinophils Absolute 0.0 0.0 - 0.5 K/uL   Basophils Relative 1 %   Basophils Absolute 0.0 0.0 - 0.1 K/uL   Immature Granulocytes 0 %   Abs Immature Granulocytes 0.01 0.00 - 0.07 K/uL    Comment: Performed at [facility], [address], [place], [place] [address]   CT ABDOMEN PELVIS WO CONTRAST  Result Date: 08/22/2023 CLINICAL DATA:  Abdominal pain. EXAM: CT ABDOMEN AND PELVIS WITHOUT CONTRAST TECHNIQUE: Multidetector CT imaging of the abdomen and pelvis was performed following the standard protocol without IV contrast. RADIATION DOSE REDUCTION: This exam was performed according to the departmental dose-optimization program which includes automated exposure control, adjustment of the mA and/or kV according to patient size and/or use of iterative reconstruction technique. COMPARISON:  May 24, 2023 FINDINGS: Lower chest: Mild linear atelectasis is seen within the left lung base. Hepatobiliary: There is diffuse fatty infiltration of the liver parenchyma. No focal liver abnormality is seen. There is mild to moderate severity gallbladder distension without evidence of gallstones, gallbladder wall thickening, or biliary dilatation. Pancreas: The head of the pancreas is enlarged, without definite evidence of an underlying mass. A moderate to marked amount of peripancreatic inflammatory fat stranding is also seen within this region. The tail of the pancreas is truncated. There is no evidence of pancreatic ductal dilatation. Spleen: No  splenic injury or perisplenic hematoma. Adrenals/Urinary Tract: Adrenal glands are unremarkable. Kidneys are normal, without renal calculi, focal lesion, or hydronephrosis. There is mild to moderate severity diffuse urinary bladder wall thickening. Stomach/Bowel: Stomach is within normal limits. The proximal portion of the duodenum, involving the segment in between the gallbladder and head of the pancreas, is markedly thickened and inflamed. The appendix appears normal. No evidence of bowel dilatation. Vascular/Lymphatic: Very mild aortic atherosclerosis. No enlarged abdominal or pelvic lymph nodes. Reproductive: The prostate gland is mildly enlarged. Other: No abdominal wall hernia or abnormality. A trace amount of posterior pelvic free fluid is noted. Musculoskeletal: No acute or significant osseous findings. IMPRESSION: 1. Findings consistent with acute pancreatitis. Correlation with laboratory values is recommended. 2. Markedly thickened and inflamed proximal portion of the duodenum, likely secondary to adjacent pancreatitis. 3. Mild to moderate severity gallbladder distension without evidence of gallstones or biliary dilatation. 4. Hepatic steatosis. 5. Mild to moderate severity diffuse urinary bladder wall thickening, which may be secondary to chronic bladder outlet obstruction. Correlation with urinalysis is recommended to exclude acute cystitis. 6. Trace amount of posterior pelvic free fluid. 7. Aortic atherosclerosis. Aortic Atherosclerosis (ICD10-I70.0). Electronically Signed   By: Aram Candela M.D.   On: 08/22/2023 20:39    Pending Labs Unresulted Labs (From admission, onward)     Start     Ordered   08/23/23 0500  CBC  Tomorrow morning,   R        08/22/23 2149   08/23/23 0500  Comprehensive metabolic panel  Tomorrow morning,   R        08/22/23 2149   08/23/23 0500  Magnesium  Tomorrow morning,   R        08/22/23 2149  08/22/23 2150  Urinalysis, Routine w reflex microscopic -Urine,  Clean Catch  Add-on,   AD       Question:  Specimen Source  Answer:  Urine, Clean Catch   08/22/23 2149   08/22/23 1641  CBC with Differential  (ED Abdominal Pain)  Once,   STAT        08/22/23 1647            Vitals/Pain Today's Vitals   08/23/23 0330 08/23/23 0341 08/23/23 0353 08/23/23 0401  BP: 127/86  127/86   Pulse: (!) 105  (!) 105   Resp: 16     Temp:  98.7 F (37.1 C)    TempSrc:  Oral    SpO2: 93%     Weight:      Height:      PainSc:    10-Worst pain ever    Isolation Precautions No active isolations  Medications Medications  heparin injection 5,000 Units (5,000 Units Subcutaneous Given 08/22/23 2212)  sodium chloride flush (NS) 0.9 % injection 3 mL (3 mLs Intravenous Not Given 08/22/23 2224)  lactated ringers infusion ( Intravenous New Bag/Given 08/22/23 2220)  ondansetron (ZOFRAN) tablet 4 mg ( Oral See Alternative 08/22/23 2301)    Or  ondansetron (ZOFRAN) injection 4 mg (4 mg Intravenous Given 08/22/23 2301)  acetaminophen (TYLENOL) tablet 650 mg (has no administration in time range)    Or  acetaminophen (TYLENOL) suppository 650 mg (has no administration in time range)  oxyCODONE (Oxy IR/ROXICODONE) immediate release tablet 5 mg (has no administration in time range)  HYDROmorphone (DILAUDID) injection 0.5-1 mg (1 mg Intravenous Given 08/23/23 0225)  LORazepam (ATIVAN) tablet 1-4 mg (2 mg Oral Given 08/23/23 0358)    Or  LORazepam (ATIVAN) injection 1-4 mg ( Intravenous See Alternative 08/23/23 0358)  thiamine (VITAMIN B1) tablet 100 mg ( Oral See Alternative 08/22/23 2212)    Or  thiamine (VITAMIN B1) injection 100 mg (100 mg Intravenous Given 08/22/23 2212)  folic acid (FOLVITE) tablet 1 mg (1 mg Oral Given 08/22/23 2212)  multivitamin with minerals tablet 1 tablet (1 tablet Oral Given 08/22/23 2212)  loperamide (IMODIUM) capsule 2-4 mg (has no administration in time range)  chlordiazePOXIDE (LIBRIUM) capsule 25 mg (25 mg Oral Given 08/22/23 2211)     Followed by  chlordiazePOXIDE (LIBRIUM) capsule 25 mg (has no administration in time range)    Followed by  chlordiazePOXIDE (LIBRIUM) capsule 25 mg (has no administration in time range)    Followed by  chlordiazePOXIDE (LIBRIUM) capsule 25 mg (has no administration in time range)  bictegravir-emtricitabine-tenofovir AF (BIKTARVY) 50-200-25 MG per tablet 1 tablet (has no administration in time range)  levETIRAcetam (KEPPRA) tablet 500 mg (500 mg Oral Given 08/22/23 2211)  morphine (PF) 4 MG/ML injection 4 mg (4 mg Intravenous Given 08/22/23 1719)  lactated ringers bolus 1,000 mL (0 mLs Intravenous Stopped 08/22/23 1849)  metoCLOPramide (REGLAN) injection 5 mg (5 mg Intravenous Given 08/22/23 1719)  pantoprazole (PROTONIX) injection 40 mg (40 mg Intravenous Given 08/22/23 1925)  sucralfate (CARAFATE) tablet 1 g (1 g Oral Given 08/22/23 1925)  alum & mag hydroxide-simeth (MAALOX/MYLANTA) 200-200-20 MG/5ML suspension 30 mL (30 mLs Oral Given 08/22/23 1925)  hyoscyamine (LEVSIN SL) SL tablet 0.25 mg (0.25 mg Sublingual Given 08/22/23 1925)  morphine (PF) 4 MG/ML injection 6 mg (6 mg Intravenous Given 08/22/23 2009)  LORazepam (ATIVAN) injection 1 mg (1 mg Intravenous Given 08/22/23 2005)  potassium chloride (KLOR-CON) packet 40 mEq (40 mEq Oral Given 08/22/23 2346)  calcium gluconate 1 g/ 50 mL sodium chloride IVPB (0 mg Intravenous Stopped 08/23/23 0040)    Mobility walks with person assist     Focused Assessments GI    R Recommendations: See Admitting Provider Note  Report given to:   Additional Notes: n/a
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
## 2023-08-24 ENCOUNTER — Encounter (HOSPITAL_COMMUNITY): Payer: Self-pay | Admitting: Internal Medicine

## 2023-08-24 DIAGNOSIS — B20 Human immunodeficiency virus [HIV] disease: Secondary | ICD-10-CM | POA: Diagnosis not present

## 2023-08-24 DIAGNOSIS — K852 Alcohol induced acute pancreatitis without necrosis or infection: Secondary | ICD-10-CM | POA: Diagnosis not present

## 2023-08-24 DIAGNOSIS — F1020 Alcohol dependence, uncomplicated: Secondary | ICD-10-CM | POA: Diagnosis not present

## 2023-08-24 LAB — COMPREHENSIVE METABOLIC PANEL
ALT: 30 U/L (ref 0–44)
AST: 38 U/L (ref 15–41)
Albumin: 2.9 g/dL — ABNORMAL LOW (ref 3.5–5.0)
Alkaline Phosphatase: 59 U/L (ref 38–126)
Anion gap: 10 (ref 5–15)
BUN: 6 mg/dL (ref 6–20)
CO2: 24 mmol/L (ref 22–32)
Calcium: 8 mg/dL — ABNORMAL LOW (ref 8.9–10.3)
Chloride: 94 mmol/L — ABNORMAL LOW (ref 98–111)
Creatinine, Ser: 0.42 mg/dL — ABNORMAL LOW (ref 0.61–1.24)
GFR, Estimated: >60 mL/min (ref >60)
Glucose, Bld: 73 mg/dL (ref 70–99)
Potassium: 3.7 mmol/L (ref 3.5–5.1)
Sodium: 128 mmol/L — ABNORMAL LOW (ref 135–145)
Total Bilirubin: 1.6 mg/dL — ABNORMAL HIGH (ref 0.3–1.2)
Total Protein: 6 g/dL — ABNORMAL LOW (ref 6.5–8.1)

## 2023-08-24 LAB — CBC
HCT: 31.5 % — ABNORMAL LOW (ref 39.0–52.0)
Hemoglobin: 9.9 g/dL — ABNORMAL LOW (ref 13.0–17.0)
MCH: 33.2 pg (ref 26.0–34.0)
MCHC: 31.4 g/dL (ref 30.0–36.0)
MCV: 105.7 fL — ABNORMAL HIGH (ref 80.0–100.0)
Platelets: 94 10*3/uL — ABNORMAL LOW (ref 150–400)
RBC: 2.98 MIL/uL — ABNORMAL LOW (ref 4.22–5.81)
RDW: 15.9 % — ABNORMAL HIGH (ref 11.5–15.5)
WBC: 2.4 10*3/uL — ABNORMAL LOW (ref 4.0–10.5)
nRBC: 0 % (ref 0.0–0.2)

## 2023-08-24 LAB — MAGNESIUM: Magnesium: 1.9 mg/dL (ref 1.7–2.4)

## 2023-08-24 LAB — PHOSPHORUS: Phosphorus: 2.8 mg/dL (ref 2.5–4.6)

## 2023-08-24 MED ORDER — OXYCODONE HCL 5 MG PO TABS
5.0000 mg | ORAL_TABLET | ORAL | Status: DC | PRN
  Administered 2023-08-24: 10 mg via ORAL
  Filled 2023-08-24: qty 2

## 2023-08-24 MED ORDER — CHLORDIAZEPOXIDE HCL 25 MG PO CAPS
25.0000 mg | ORAL_CAPSULE | Freq: Four times a day (QID) | ORAL | Status: DC
  Administered 2023-08-24 (×3): 25 mg via ORAL
  Filled 2023-08-24 (×3): qty 1

## 2023-08-24 MED ORDER — CHLORDIAZEPOXIDE HCL 25 MG PO CAPS: 25.0000 mg | ORAL_CAPSULE | Freq: Every day | ORAL | Status: DC

## 2023-08-24 MED ORDER — CHLORDIAZEPOXIDE HCL 25 MG PO CAPS: 25.0000 mg | ORAL_CAPSULE | ORAL | Status: DC

## 2023-08-24 MED ORDER — CHLORDIAZEPOXIDE HCL 25 MG PO CAPS
25.0000 mg | ORAL_CAPSULE | Freq: Three times a day (TID) | ORAL | Status: DC
  Administered 2023-08-25: 25 mg via ORAL
  Filled 2023-08-24 (×2): qty 1

## 2023-08-24 MED ORDER — HYDROMORPHONE HCL 1 MG/ML IJ SOLN
1.0000 mg | INTRAMUSCULAR | Status: DC | PRN
  Administered 2023-08-24 – 2023-09-01 (×28): 1 mg via INTRAVENOUS
  Filled 2023-08-24 (×29): qty 1

## 2023-08-24 MED ORDER — OXYCODONE HCL 5 MG PO TABS
5.0000 mg | ORAL_TABLET | ORAL | Status: DC | PRN
  Administered 2023-08-29 – 2023-09-01 (×5): 5 mg via ORAL
  Filled 2023-08-24 (×5): qty 1

## 2023-08-24 MED ORDER — SODIUM CHLORIDE 0.9 % IV SOLN: INTRAVENOUS | Status: DC

## 2023-08-24 NOTE — Plan of Care (Signed)
  Problem: Health Behavior/Discharge Planning: Goal: Ability to manage health-related needs will improve Outcome: Not Progressing   Problem: Clinical Measurements: Goal: Ability to maintain clinical measurements within normal limits will improve Outcome: Not Progressing   Problem: Pain Managment: Goal: General experience of comfort will improve Outcome: Not Progressing   Problem: Education: Goal: Knowledge of General Education information will improve Description: Including pain rating scale, medication(s)/side effects and non-pharmacologic comfort measures Outcome: Progressing

## 2023-08-24 NOTE — Progress Notes (Addendum)
[facility] PROGRESS NOTE  Gary [name] (DOB: Aug 18, 1986) UEA:[id number] PCP: Patient, No Pcp Per  Brief Narrative: Gary [name] is a 37 y.o. male with a history of HIV disease, seizure disorder, EtOH abuse, recurrent alcohol-associated pancreatitis, bipolar disorder, homelessness who presented to the ED on 08/22/2023 with nausea, vomiting and abdominal pain. He was tachycardic and hypertensive with serum EtOH 280, CT abd/pelvis consistent with acute pancreatitis. Admitted to SDU for acute recurrent pancreatitis and alcohol withdrawal.   Subjective: Reports pain in abdomen improved with medications, no hallucinations today.   Objective: BP (!) 114/56   Pulse 91   Temp 99.7 F (37.6 C) (Oral)   Resp 13   Ht 6' (1.829 m)   Wt 66 kg   SpO2 94%   BMI 19.73 kg/m   Gen: Frail, chronically ill-appearing male in no distress  Pulm: Clear, nonlabored  CV: Regular borderline tachycardia (rate slower over past 24 hours) GI: Soft, tender without rebound, guarding, rigidity, +BS Neuro: Alert, interactive but nods off at times. No dysarthria, moves all extremities spontaneously.  Ext: Warm, no deformities Skin: Ecchymoses are stable, no new rashes, lesions or ulcers on visualized skin   Assessment & Plan: Principal Problem:   Acute alcoholic pancreatitis Active Problems:   Alcohol use disorder, severe, dependence (HCC)   History of seizures   HIV disease (HCC)   Pancytopenia (HCC)  Acute recurrent alcohol associated pancreatitis: CT without fluid collection, and not suggestive of necrosis (though was without contrast) at admission.  - NPO, IVF, IV analgesics and antiemetics. Continue this.   Alcohol abuse and withdrawal: EtOH detectable at admission, last drink 10/13 AM.  - Continue librium 25mg  QID, would not taper yet, and SDU intensity prn ativan per CIWA. Scores 4-17 over past 12 hours. Has not yet required precedex and appears a bit more stable than yesterday. Remains in  SDU.  - Continue folic acid, thiamine, MVM  Seizure disorder:  - Keppra restarted   HIV disease: CD4 count 248, VL 47k on 07/18/23, has not been taking ART for last month.  - Restarted biktarvy, confirmed copay is $0.    Bladder wall thickening: No nitrites or leuk esterase on UA. No urinary symptoms reported.   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 Cell lines down some with IV fluids.  - Monitoring with typical transfusion thresholds.   Hyponatremia:  - Continue isotonic fluids and monitor.   Hyperbilirubinemia: Mild - Continue monitoring.   Hypokalemia:  - Supplemented, will monitor  Hypomagnesemia:  - Supplemented, will monitor  Hypocalcemia: Supplemented. With hypoalbuminemia, corrects to 8.9 which is the lower limit of normal.   Bruising: Multiple significant bruises noted due to a fall and "taking hits". Plt and hgb stabilized. Will monitor.  [name], MD [facility] [email or web address] 08/24/2023, 9:35 AM

## 2023-08-25 ENCOUNTER — Other Ambulatory Visit: Payer: Self-pay

## 2023-08-25 DIAGNOSIS — K852 Alcohol induced acute pancreatitis without necrosis or infection: Secondary | ICD-10-CM | POA: Diagnosis not present

## 2023-08-25 DIAGNOSIS — F1021 Alcohol dependence, in remission: Secondary | ICD-10-CM

## 2023-08-25 DIAGNOSIS — B20 Human immunodeficiency virus [HIV] disease: Secondary | ICD-10-CM | POA: Diagnosis not present

## 2023-08-25 DIAGNOSIS — F1024 Alcohol dependence with alcohol-induced mood disorder: Secondary | ICD-10-CM

## 2023-08-25 DIAGNOSIS — F10151 Alcohol abuse with alcohol-induced psychotic disorder with hallucinations: Secondary | ICD-10-CM

## 2023-08-25 DIAGNOSIS — F1020 Alcohol dependence, uncomplicated: Secondary | ICD-10-CM | POA: Diagnosis not present

## 2023-08-25 LAB — CBC
HCT: 32.2 % — ABNORMAL LOW (ref 39.0–52.0)
Hemoglobin: 10.3 g/dL — ABNORMAL LOW (ref 13.0–17.0)
MCH: 33.6 pg (ref 26.0–34.0)
MCHC: 32 g/dL (ref 30.0–36.0)
MCV: 104.9 fL — ABNORMAL HIGH (ref 80.0–100.0)
Platelets: 105 10*3/uL — ABNORMAL LOW (ref 150–400)
RBC: 3.07 MIL/uL — ABNORMAL LOW (ref 4.22–5.81)
RDW: 15.4 % (ref 11.5–15.5)
WBC: 2.4 10*3/uL — ABNORMAL LOW (ref 4.0–10.5)
nRBC: 0 % (ref 0.0–0.2)

## 2023-08-25 LAB — COMPREHENSIVE METABOLIC PANEL
ALT: 27 U/L (ref 0–44)
AST: 36 U/L (ref 15–41)
Albumin: 2.9 g/dL — ABNORMAL LOW (ref 3.5–5.0)
Alkaline Phosphatase: 67 U/L (ref 38–126)
Anion gap: 13 (ref 5–15)
BUN: <5 mg/dL — ABNORMAL LOW (ref 6–20)
CO2: 22 mmol/L (ref 22–32)
Calcium: 8.3 mg/dL — ABNORMAL LOW (ref 8.9–10.3)
Chloride: 98 mmol/L (ref 98–111)
Creatinine, Ser: 0.53 mg/dL — ABNORMAL LOW (ref 0.61–1.24)
GFR, Estimated: >60 mL/min (ref >60)
Glucose, Bld: 63 mg/dL — ABNORMAL LOW (ref 70–99)
Potassium: 4 mmol/L (ref 3.5–5.1)
Sodium: 133 mmol/L — ABNORMAL LOW (ref 135–145)
Total Bilirubin: 1.5 mg/dL — ABNORMAL HIGH (ref 0.3–1.2)
Total Protein: 6.2 g/dL — ABNORMAL LOW (ref 6.5–8.1)

## 2023-08-25 LAB — GLUCOSE, CAPILLARY
Glucose-Capillary: 101 mg/dL — ABNORMAL HIGH (ref 70–99)
Glucose-Capillary: 66 mg/dL — ABNORMAL LOW (ref 70–99)

## 2023-08-25 MED ORDER — LORAZEPAM 1 MG PO TABS: 1.0000 mg | ORAL_TABLET | Freq: Every day | ORAL | Status: DC

## 2023-08-25 MED ORDER — CLINIMIX E/DEXTROSE (8/10) 8 % IV SOLN
INTRAVENOUS | Status: DC
  Filled 2023-08-25: qty 1000

## 2023-08-25 MED ORDER — LORAZEPAM 1 MG PO TABS
1.0000 mg | ORAL_TABLET | Freq: Four times a day (QID) | ORAL | Status: DC
  Administered 2023-08-25 (×2): 1 mg via ORAL
  Filled 2023-08-25 (×3): qty 1

## 2023-08-25 MED ORDER — CHLORPROMAZINE HCL 25 MG PO TABS
25.0000 mg | ORAL_TABLET | Freq: Three times a day (TID) | ORAL | Status: DC | PRN
  Administered 2023-08-26: 25 mg via ORAL
  Filled 2023-08-25 (×2): qty 1

## 2023-08-25 MED ORDER — LORAZEPAM 1 MG PO TABS: 1.0000 mg | ORAL_TABLET | Freq: Three times a day (TID) | ORAL | Status: DC

## 2023-08-25 MED ORDER — NICOTINE 14 MG/24HR TD PT24
14.0000 mg | MEDICATED_PATCH | Freq: Every day | TRANSDERMAL | Status: DC
  Filled 2023-08-25 (×2): qty 1

## 2023-08-25 MED ORDER — DEXTROSE-SODIUM CHLORIDE 5-0.9 % IV SOLN: INTRAVENOUS | Status: AC

## 2023-08-25 MED ORDER — FAT EMUL FISH OIL/PLANT BASED 20% (SMOFLIPID)IV EMUL
250.0000 mL | INTRAVENOUS | Status: DC
  Filled 2023-08-25: qty 250

## 2023-08-25 MED ORDER — LORAZEPAM 1 MG PO TABS: 1.0000 mg | ORAL_TABLET | Freq: Two times a day (BID) | ORAL | Status: DC

## 2023-08-25 MED ORDER — INSULIN ASPART 100 UNIT/ML IJ SOLN
0.0000 [IU] | Freq: Three times a day (TID) | INTRAMUSCULAR | Status: DC
  Administered 2023-08-26: 1 [IU] via SUBCUTANEOUS

## 2023-08-25 MED ORDER — CHLORPROMAZINE HCL 25 MG/ML IJ SOLN: 25.0000 mg | Freq: Three times a day (TID) | INTRAMUSCULAR | Status: DC | PRN

## 2023-08-25 MED ORDER — CHLORPROMAZINE HCL 25 MG PO TABS
25.0000 mg | ORAL_TABLET | Freq: Three times a day (TID) | ORAL | Status: DC
  Administered 2023-08-25 (×2): 25 mg via ORAL
  Filled 2023-08-25 (×4): qty 1
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
## 2023-08-25 NOTE — Progress Notes (Signed)
PICC RN came to bedside to obtain consent for PICC placement and patient now refuses to have PICC placed. This RN educated patient on why the PICC line was ordered. He is still refusing. Will not be able to start/hang his TPN due to this and will continue his maintenance fluids at this time.

## 2023-08-25 NOTE — Progress Notes (Signed)
PHARMACY - TOTAL PARENTERAL NUTRITION CONSULT NOTE   Indication:  severe pancreatitis  Patient Measurements: Height: 6' (182.9 cm) Weight: 66 kg (145 lb 8.1 oz) IBW/kg (Calculated) : 77.6 TPN AdjBW (KG): 66 Body mass index is 19.73 kg/m.  Assessment: 37 year old male with history of HIV disease (recently non-compliant with therapy), EtOH abuse and recurrent alcohol-associated pancreatitis presented with nausea, vomiting and abdominal pain. Oral intake limited by severe pain. Per notes, patient with increasing agitation and using expletives. He declines placement of NGT.  Glucose / Insulin: no hx DM Electrolytes: Na 133, all others WNL Renal: WNL Hepatic: WNL, alb 2.9 Intake / Output; MIVF:  -MIVF at 100 ml/hr -UOP 650 ml/hr GI Imaging: -10/13 CT a/p consistent with acute pancreatitis GI Surgeries / Procedures: NA  Central access: pending PICC TPN start date: 10/16 pending PICC  Nutritional Goals: Clinimix + lipids at current rate will provide 80 g protein and 1164 kcal/day  RD Assessment: Estimated Needs Total Energy Estimated Needs: 2000-2200 kcals Total Protein Estimated Needs: 100-120 grams Total Fluid Estimated Needs: >/= 2L  Current Nutrition: NPO  Plan:  -Clinimix-E 8/10 at 42 ml/hr (24 hr) + SMOF 250 ml over 12 hr *This regimen will not meet current estimated needs from RD, may need to consider giving 2 L bag a few times a week to meet needs depending on course of therapy* -Pt receiving PO multivitamin, thiamine, folic acid -Initiate Sensitive q8h SSI and adjust as needed; may be able to discontinue depending on use  -Will stop D5NS when TPN starts per discussion with MD -Monitor TPN labs on Mon/Thurs at minimum  Pricilla Riffle, PharmD, BCPS Clinical Pharmacist 08/25/2023 3:09 PM

## 2023-08-25 NOTE — Progress Notes (Signed)
[facility] PROGRESS NOTE  Gary [name] (DOB: 19-Aug-1986) BJY:[id number] PCP: Patient, No Pcp Per  Brief Narrative: Gary [name] is a 37 y.o. male with a history of HIV disease, seizure disorder, EtOH abuse, recurrent alcohol-associated pancreatitis, bipolar disorder, homelessness who presented to the ED on 08/22/2023 with nausea, vomiting and abdominal pain. He was tachycardic and hypertensive with serum EtOH 280, CT abd/pelvis consistent with acute pancreatitis. Admitted to SDU for acute recurrent pancreatitis and alcohol withdrawal. Attempt at noncaloric liquids on 10/15 was met with severe abdominal pain. Exam remains suggestive of severe pancreatitis, so PICC/TPN is started (pt vehemently declines postpyloric feeding tube). Due to growing agitation and behavioral issues emerging consistent with prior admissions, psychiatry is consulted for additional recommendations.   Subjective: Pain severe, was worse with water yesterday. He is getting increasingly agitated at time of encounter, using expletives declines a NGT, says he's not just going to sit around and starve. Denies hallucinations.   Objective: BP 125/73   Pulse 84   Temp 98.6 F (37 C) (Oral)   Resp 15   Ht 6' (1.829 m)   Wt 66 kg   SpO2 90%   BMI 19.73 kg/m   Gen: Frail male appearing older than stated age.  Pulm: Clear, nonlabored  CV: Regular, normal rate, no MRG or edema GI: Soft, tender to very light palpation in epigastrium without rebound. +BS. Neuro: Alert and oriented. MAE. Not cooperative with full exam.  Ext: Warm, no deformities Skin: Stable severe diffuse ecchymoses.    Assessment & Plan: Principal Problem:   Acute alcoholic pancreatitis Active Problems:   Alcohol use disorder, severe, dependence (HCC)   History of seizures   HIV disease (HCC)   Pancytopenia (HCC)  Acute recurrent alcohol associated pancreatitis: CT without fluid collection, and not suggestive of necrosis (though was without  contrast) at admission.  - NPO, IVF, IV analgesics and antiemetics. Continue this. Failed attempt at noncaloric fluid 10/15. Recommended postpyloric feeding tube which patient declines. There is shortage on IVF, so we will start clinimix TPN thru PICC (pt consents as long as site is numbed).  - With hypoglycemia, will add D5 to IVF.  Alcohol abuse and withdrawal: EtOH detectable at admission, last drink 10/13 AM.  - Continue librium taper, and SDU intensity prn ativan per CIWA. Scores 8-15 over past 12 hours.  - Continue folic acid, thiamine, MVM  Seizure disorder:  - Keppra restarted   HIV disease: CD4 count 248, VL 47k on 07/18/23, has not been taking ART for last month.  - Restarted biktarvy, confirmed copay is $0.    Mood disorder, NOS:  - His agitation/behavioral issues have been noted in multiple prior hospitalizations. Current agitation strikes me more as psychiatric than due to withdrawal. I would appreciate any recommendations psychiatry consultant has to offer. QTc .   Bladder wall thickening: No nitrites or leuk esterase on UA. No urinary symptoms reported.   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 Cell lines down some with IV fluids.  - Monitoring with typical transfusion thresholds.   Hyponatremia:  - Continue isotonic fluids and monitor.   Hyperbilirubinemia: Mild - Continue monitoring.   Hypokalemia:  - Supplemented, will monitor, improving.  Hypomagnesemia:  - Supplemented, will monitor  Hypocalcemia: Supplemented. With hypoalbuminemia, corrects to 8.9 which is the lower limit of normal.   Bruising: Multiple significant bruises noted due to a fall and "taking hits". Plt and hgb stabilized. Will monitor.  [name], MD [facility]  [email or web address] 08/25/2023, 9:01 AM

## 2023-08-25 NOTE — TOC Progression Note (Signed)
Transition of Care [facility]) - Progression Note    Patient Details  Name: Gary [name] MRN: [id number] Date of Birth: 1986-08-29  Transition of [facility]) CM/SW Contact  Darleene Cleaver, [place] Phone Number: 08/25/2023, 5:47 PM  Clinical Narrative:     TOC has provided resources on AVS.  SDOH has been addressed, no further anticipated TOC needs.       Expected Discharge Plan and Services                                               Social Determinants of Health (SDOH) Interventions SDOH Screenings   Food Insecurity: Food Insecurity Present (08/23/2023)  Housing: Medium Risk (08/23/2023)  Transportation Needs: Unmet Transportation Needs (08/23/2023)  Utilities: Patient Declined (08/23/2023)  Alcohol Screen: High Risk (07/25/2021)  Depression (PHQ2-9): Medium Risk (02/12/2021)  Financial Resource Strain: High Risk (06/30/2022)   Received from [facility], [facility]  Social Connections: Unknown (06/24/2022)   Received from [facility], [facility]  Stress: No Stress Concern Present (06/30/2022)   Received from [facility], [facility]  Recent Concern: Stress - Stress Concern Present (06/25/2022)   Received from [facility]  Tobacco Use: High Risk (08/24/2023)    Readmission Risk Interventions    08/23/2023    6:11 PM 06/15/2023   10:18 AM 05/25/2023    1:34 PM  Readmission Risk Prevention Plan  Transportation Screening Complete Complete Complete  Medication Review (RN Care Manager) Complete Complete Complete  PCP or Specialist appointment within 3-5 days of discharge Complete  Complete  HRI or Home Care Consult Complete Complete Complete  SW Recovery Care/Counseling Consult Complete Complete Complete  Palliative Care Screening Not Applicable Not Applicable Not Applicable  Skilled Nursing Facility Not Applicable Not Applicable Not Applicable

## 2023-08-25 NOTE — Progress Notes (Signed)
Initial Nutrition Assessment  DOCUMENTATION CODES:   Severe malnutrition in context of chronic illness  INTERVENTION:  - TPN to start tonight. Clinimix-E at 20mL/hr + 20% SMOFLIPID  - TPN management per pharmacy.   - Monitor magnesium, potassium, and phosphorus BID for at least 3 days, MD to replete as needed, as pt is at risk for refeeding syndrome given severe malnutrition. - Continue 100mg  thiamine.  - Would recommend stopping D5 once TPN started.   - Continue daily Multivitamin with minerals daily and folic acid due to history of alcohol abuse.   - Daily weights while on TPN.    NUTRITION DIAGNOSIS:   Severe Malnutrition related to chronic illness as evidenced by severe fat depletion, severe muscle depletion.  GOAL:   Patient will meet greater than or equal to 90% of their needs  MONITOR:   Diet advancement, Labs, Weight trends  REASON FOR ASSESSMENT:   Consult New TPN/TNA  ASSESSMENT:   37 y.o. male with PMH of HIV disease, seizure disorder, EtOH abuse, recurrent alcohol-associated pancreatitis, bipolar disorder, homelessness who presented to the ED on 08/22/2023 with nausea, vomiting and abdominal pain. Admitted for acute recurrent alcohol associated pancreatitis.   Patient sleeping during conversation, whispering responses.  Patient reports a UBW of 155# and states his weight tends to fluctuate up and down. Denies any significant changes recently. Per EMR, weight stbale the past 1 month.   He endorses only eating 1 meal a day at home. Noted to have reported drinking 2 fifths of liquor daily.   Patient complaining that he is currently not allowed to eat. Patient had water yesterday which resulted in severe pain.   Per MD, plan to start TPN today. Patient at risk for refeeding syndrome. Has been receiving thiamine since 10/13.   Medications reviewed and include: 1mg  folic acid, MVI, 100mg  thiamine, D5 @ 168mL/hr (providing 408 kcals over 24  hours)  Labs reviewed:  Na 133   NUTRITION - FOCUSED PHYSICAL EXAM:  Flowsheet Row Most Recent Value  Orbital Region Severe depletion  Upper Arm Region Severe depletion  Thoracic and Lumbar Region Moderate depletion  Buccal Region Severe depletion  Temple Region Moderate depletion  Clavicle Bone Region Severe depletion  Clavicle and Acromion Bone Region Severe depletion  Scapular Bone Region Unable to assess  Dorsal Hand Moderate depletion  Patellar Region Severe depletion  Anterior Thigh Region Severe depletion  Posterior Calf Region Severe depletion  Edema (RD Assessment) None  Hair Reviewed  Eyes Reviewed  Mouth Reviewed  Skin Reviewed  Nails Reviewed       Diet Order:   Diet Order             Diet NPO time specified Except for: Ice Chips, Sips with Meds  Diet effective now                   EDUCATION NEEDS:  Education needs have been addressed  Skin:  Skin Assessment: Reviewed RN Assessment  Last BM:  PTA  Height:   Ht Readings from Last 1 Encounters:  08/22/23 6' (1.829 m)   Weight:  Wt Readings from Last 1 Encounters:  08/22/23 66 kg    BMI:  Body mass index is 19.73 kg/m.  Estimated Nutritional Needs:  Kcal:  2000-2200 kcals Protein:  100-120 grams Fluid:  >/= 2L    Shelle Iron RD, LDN For contact information, refer to [facility].

## 2023-08-25 NOTE — Progress Notes (Signed)
Arrived to discuss bedside PICC placement including risks, benefits, and alternatives. Pt stated "not tonight, I'm too F..ing tired." Primary RN called to bedside to discuss PICC placement. Pt refused again.

## 2023-08-26 DIAGNOSIS — F1020 Alcohol dependence, uncomplicated: Secondary | ICD-10-CM | POA: Diagnosis not present

## 2023-08-26 DIAGNOSIS — K852 Alcohol induced acute pancreatitis without necrosis or infection: Secondary | ICD-10-CM | POA: Diagnosis not present

## 2023-08-26 DIAGNOSIS — B20 Human immunodeficiency virus [HIV] disease: Secondary | ICD-10-CM | POA: Diagnosis not present

## 2023-08-26 LAB — COMPREHENSIVE METABOLIC PANEL
ALT: 28 U/L (ref 0–44)
AST: 34 U/L (ref 15–41)
Albumin: 3.1 g/dL — ABNORMAL LOW (ref 3.5–5.0)
Alkaline Phosphatase: 72 U/L (ref 38–126)
Anion gap: 9 (ref 5–15)
BUN: <5 mg/dL — ABNORMAL LOW (ref 6–20)
CO2: 26 mmol/L (ref 22–32)
Calcium: 8.5 mg/dL — ABNORMAL LOW (ref 8.9–10.3)
Chloride: 97 mmol/L — ABNORMAL LOW (ref 98–111)
Creatinine, Ser: 0.53 mg/dL — ABNORMAL LOW (ref 0.61–1.24)
GFR, Estimated: >60 mL/min (ref >60)
Glucose, Bld: 116 mg/dL — ABNORMAL HIGH (ref 70–99)
Potassium: 3.5 mmol/L (ref 3.5–5.1)
Sodium: 132 mmol/L — ABNORMAL LOW (ref 135–145)
Total Bilirubin: 1.4 mg/dL — ABNORMAL HIGH (ref 0.3–1.2)
Total Protein: 6.7 g/dL (ref 6.5–8.1)

## 2023-08-26 LAB — GLUCOSE, CAPILLARY
Glucose-Capillary: 130 mg/dL — ABNORMAL HIGH (ref 70–99)
Glucose-Capillary: 108 mg/dL — ABNORMAL HIGH (ref 70–99)
Glucose-Capillary: 105 mg/dL — ABNORMAL HIGH (ref 70–99)

## 2023-08-26 LAB — MAGNESIUM: Magnesium: 1.7 mg/dL (ref 1.7–2.4)

## 2023-08-26 LAB — PHOSPHORUS: Phosphorus: 3.4 mg/dL (ref 2.5–4.6)

## 2023-08-26 MED ORDER — PHENOBARBITAL SODIUM 65 MG/ML IJ SOLN
65.0000 mg | Freq: Three times a day (TID) | INTRAMUSCULAR | Status: AC
  Administered 2023-08-26 – 2023-08-27 (×6): 65 mg via INTRAVENOUS
  Filled 2023-08-26 (×6): qty 1

## 2023-08-26 MED ORDER — POTASSIUM CHLORIDE CRYS ER 20 MEQ PO TBCR
40.0000 meq | EXTENDED_RELEASE_TABLET | Freq: Once | ORAL | Status: DC
  Filled 2023-08-26: qty 2

## 2023-08-26 MED ORDER — MAGNESIUM SULFATE 2 GM/50ML IV SOLN
2.0000 g | Freq: Once | INTRAVENOUS | Status: AC
  Administered 2023-08-26: 2 g via INTRAVENOUS
  Filled 2023-08-26: qty 50

## 2023-08-26 MED ORDER — LORAZEPAM 1 MG PO TABS: 1.0000 mg | ORAL_TABLET | ORAL | Status: AC | PRN

## 2023-08-26 MED ORDER — CHLORDIAZEPOXIDE HCL 25 MG PO CAPS: 25.0000 mg | ORAL_CAPSULE | Freq: Every day | ORAL | Status: DC

## 2023-08-26 MED ORDER — CHLORPROMAZINE HCL 25 MG/ML IJ SOLN: 25.0000 mg | Freq: Two times a day (BID) | INTRAMUSCULAR | Status: DC | PRN

## 2023-08-26 MED ORDER — LORAZEPAM 2 MG/ML IJ SOLN
1.0000 mg | Freq: Once | INTRAMUSCULAR | Status: AC
  Administered 2023-08-26: 1 mg via INTRAVENOUS
  Filled 2023-08-26: qty 1

## 2023-08-26 MED ORDER — LORAZEPAM 2 MG/ML IJ SOLN
1.0000 mg | INTRAMUSCULAR | Status: AC | PRN
  Administered 2023-08-29: 1 mg via INTRAVENOUS
  Filled 2023-08-26 (×2): qty 1

## 2023-08-26 MED ORDER — LORAZEPAM 1 MG PO TABS
1.0000 mg | ORAL_TABLET | ORAL | Status: AC | PRN
  Administered 2023-08-28: 3 mg via ORAL
  Administered 2023-08-28 (×2): 2 mg via ORAL
  Filled 2023-08-26: qty 2
  Filled 2023-08-26: qty 3
  Filled 2023-08-26 (×2): qty 2

## 2023-08-26 MED ORDER — LORAZEPAM 2 MG/ML IJ SOLN
4.0000 mg | Freq: Once | INTRAMUSCULAR | Status: AC
  Administered 2023-08-26: 4 mg via INTRAVENOUS

## 2023-08-26 MED ORDER — PHENOBARBITAL SODIUM 65 MG/ML IJ SOLN: 65.0000 mg | Freq: Three times a day (TID) | INTRAMUSCULAR | Status: DC

## 2023-08-26 MED ORDER — CHLORDIAZEPOXIDE HCL 25 MG PO CAPS: 25.0000 mg | ORAL_CAPSULE | Freq: Four times a day (QID) | ORAL | Status: DC

## 2023-08-26 MED ORDER — LACTATED RINGERS IV BOLUS
500.0000 mL | Freq: Once | INTRAVENOUS | Status: AC
  Administered 2023-08-26: 500 mL via INTRAVENOUS

## 2023-08-26 MED ORDER — LORAZEPAM 2 MG/ML IJ SOLN
1.0000 mg | INTRAMUSCULAR | Status: AC | PRN
  Administered 2023-08-26: 3 mg via INTRAVENOUS
  Administered 2023-08-26: 4 mg via INTRAVENOUS
  Filled 2023-08-26 (×4): qty 2

## 2023-08-26 MED ORDER — LORAZEPAM 1 MG PO TABS: 1.0000 mg | ORAL_TABLET | ORAL | Status: DC | PRN

## 2023-08-26 MED ORDER — CHLORDIAZEPOXIDE HCL 25 MG PO CAPS: 25.0000 mg | ORAL_CAPSULE | ORAL | Status: DC

## 2023-08-26 MED ORDER — CHLORDIAZEPOXIDE HCL 25 MG PO CAPS: 25.0000 mg | ORAL_CAPSULE | Freq: Three times a day (TID) | ORAL | Status: DC

## 2023-08-26 MED ORDER — LORAZEPAM 2 MG/ML IJ SOLN
1.0000 mg | INTRAMUSCULAR | Status: DC | PRN
  Administered 2023-08-26: 4 mg via INTRAVENOUS

## 2023-08-26 MED ORDER — MAGNESIUM OXIDE -MG SUPPLEMENT 400 (240 MG) MG PO TABS
400.0000 mg | ORAL_TABLET | Freq: Once | ORAL | Status: DC
  Filled 2023-08-26: qty 1

## 2023-08-26 MED ORDER — POTASSIUM CHLORIDE 10 MEQ/100ML IV SOLN
10.0000 meq | INTRAVENOUS | Status: AC
  Administered 2023-08-26 (×4): 10 meq via INTRAVENOUS
  Filled 2023-08-26 (×4): qty 100

## 2023-08-26 NOTE — Progress Notes (Signed)
eLink Physician-Brief Progress Note Patient Name: Gary [name] DOB: 26-Nov-1985 MRN: [id number]   Date of Service  08/26/2023  HPI/Events of Note  Camera alert: Cleaning him, woke up, HR > 140 to 160, with stable MAP Now HR normalizing to 120's . SBP 126. Sats good, no chest pain. On room air.  On TPN. Pancreatitis. On anti retroviral meds.   eICU Interventions  - follow AM labs for any electrolyte imbalance, for now will give LR 500 ml bolus. If not better call us back.      Intervention Category Intermediate Interventions: Arrhythmia - evaluation and management  Ranee Gosselin 08/26/2023, 5:29 AM

## 2023-08-26 NOTE — Plan of Care (Signed)
  Problem: Clinical Measurements: Goal: Respiratory complications will improve Outcome: Progressing   Problem: Elimination: Goal: Will not experience complications related to urinary retention Outcome: Progressing   Problem: Skin Integrity: Goal: Risk for impaired skin integrity will decrease Outcome: Progressing   Problem: Fluid Volume: Goal: Ability to maintain a balanced intake and output will improve Outcome: Progressing   Problem: Metabolic: Goal: Ability to maintain appropriate glucose levels will improve Outcome: Progressing   Problem: Education: Goal: Knowledge of General Education information will improve Description: Including pain rating scale, medication(s)/side effects and non-pharmacologic comfort measures Outcome: Not Progressing   Problem: Health Behavior/Discharge Planning: Goal: Ability to manage health-related needs will improve Outcome: Not Progressing   Problem: Clinical Measurements: Goal: Ability to maintain clinical measurements within normal limits will improve Outcome: Not Progressing Goal: Cardiovascular complication will be avoided Outcome: Not Progressing   Problem: Nutrition: Goal: Adequate nutrition will be maintained Outcome: Not Progressing   Problem: Coping: Goal: Level of anxiety will decrease Outcome: Not Progressing   Problem: Elimination: Goal: Will not experience complications related to bowel motility Outcome: Not Progressing   Problem: Pain Managment: Goal: General experience of comfort will improve Outcome: Not Progressing   Problem: Safety: Goal: Ability to remain free from injury will improve Outcome: Not Progressing   Problem: Coping: Goal: Ability to adjust to condition or change in health will improve Outcome: Not Progressing   Problem: Nutritional: Goal: Maintenance of adequate nutrition will improve Outcome: Not Progressing

## 2023-08-26 NOTE — Progress Notes (Signed)
[facility] PROGRESS NOTE  Gary [name] (DOB: [birth year]) WJX:[id number] PCP: Patient, No Pcp Per  Brief Narrative: AUNDRA ESPIN is a 37 y.o. male with a history of HIV disease, seizure disorder, EtOH abuse, recurrent alcohol-associated pancreatitis, bipolar disorder, homelessness who presented to the ED on 08/22/2023 with nausea, vomiting and abdominal pain. He was tachycardic and hypertensive with serum EtOH 280, CT abd/pelvis consistent with acute pancreatitis. Admitted to SDU for acute recurrent pancreatitis and alcohol withdrawal. Attempt at noncaloric liquids on 10/15 was met with severe abdominal pain. Exam remains suggestive of severe pancreatitis, so PICC/TPN is started (pt vehemently declines postpyloric feeding tube). Due to growing agitation and behavioral issues emerging consistent with prior admissions, psychiatry is consulted for additional recommendations. Agitation worsening 10/17. Phenobarbital ordered and CCM consulted for consideration of precedex.   Subjective: Drowsy after receiving the 4mg  ativan for severe agitation which worsened overnight. He's been abusive and threatening.   Objective: BP 131/88   Pulse (!) 117   Temp 98.6 F (37 C) (Axillary)   Resp 15   Ht 6' (1.829 m)   Wt 66 kg   SpO2 (!) 88%   BMI 19.73 kg/m   Gen: Chronically ill-appearing male in no acute distress Pulm: Clear, nonlabored  CV: Regular tachycardia, no MRG, no edema GI: Soft, grimaces with epigastric palpation, nondistended, +BS Neuro: Drowsy, not cooperative with full exam. Maintaining airway, moving all extremities.    Assessment & Plan: Principal Problem:   Acute alcoholic pancreatitis Active Problems:   Alcohol use disorder, severe, dependence (HCC)   History of seizures   HIV disease (HCC)   Pancytopenia (HCC)  Acute recurrent alcohol associated pancreatitis: CT without fluid collection, and not suggestive of necrosis (though was without contrast) at admission.  -  Continue NPO, IVF, IV analgesics and antiemetics. Failed attempt at noncaloric fluid 10/15. Recommended postpyloric feeding tube which patient declined, but agreed to PICC/TPN initially. Later refused this. Will continue monitoring clinical progress to decide on feeding possibilities.   Alcohol abuse and withdrawal: EtOH detectable at admission, last drink 10/13 AM.  - Continue librium taper. This was changed to ativan 1mg , will change back to 25mg  and add phenobarbital. PCCM consulted in case he needs precedex. Soft restraints ordered as he has been severely abusive and threatening to staff and remains a danger to them and himself.  - Continue folic acid, thiamine, MVM  Seizure disorder:  - Keppra restarted   HIV disease: CD4 count 248, VL 47k on 07/18/23, has not been taking ART for last month.  - Restarted biktarvy, confirmed copay is $0.    Mood disorder, NOS:  - His agitation/behavioral issues have been noted in multiple prior hospitalizations. Current agitation strikes me more as psychiatric than due to withdrawal. I would appreciate any recommendations psychiatry consultant has to offer. QTc .   Bladder wall thickening: No nitrites or leuk esterase on UA. No urinary symptoms reported.   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 Cell lines down some with IV fluids.  - Monitoring with typical transfusion thresholds.   Hyponatremia:  - Continue isotonic fluids and monitor.   Hyperbilirubinemia: Mild - Continue monitoring.   Hypokalemia:  - Supplemented, will monitor, improving.  Hypomagnesemia:  - Supplemented, will monitor  Hypocalcemia: Supplemented. With hypoalbuminemia, corrects to 8.9 which is the lower limit of normal.   Bruising: Multiple significant bruises noted due to a fall and "taking hits". Plt and hgb stabilized. Will monitor.  [name], MD [facility] [email or web address] 08/26/2023, 10:19 AM
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
## 2023-08-26 NOTE — Consult Note (Signed)
NAME:  Gary EMPEY, MRN:  [id number], DOB:  Sep 02, 1986, LOS: 4 ADMISSION DATE:  08/22/2023, CONSULTATION DATE:  08/26/2023 REFERRING MD:  [name] - [facility], CHIEF COMPLAINT:  ETOH withdrawal    History of Present Illness:  Gary [name] is a 37 y.o. male with a PMH significant for alcohol abuse  disorder with history of withdrawl seizures, alcohol induce pancreatitis, polysubstance use, bipolar 2 disorder, schizophrenia, PTSD, prior SDH, and homelessness who presented to the ED 08/22/2023 with complaints of abdominal pain with associated nausea and vomiting. Patient states symptoms felt similar to prior episodes of pancreatitis.   Patient was admitted per hospitilist service for management of acute alcohol induced pancreatitis.   By morning of 10/17 patient began displaying signs of worsening alcohol withdrawal despite CIWA protocol and frequent benzodiazepines doses. PCCM consulted for assistance in management   Pertinent  Medical History  Alcohol abuse  disorder with history of withdrawl seizures, alcohol induce pancreatitis, polysubstance use, bipolar 2 disorder, schizophrenia, PTSD, prior SDH, and homelessness  Significant Hospital Events: Including procedures, antibiotic start and stop dates in addition to other pertinent events   10/13 admitted for acute alcohol induce pancreatitis  10/17 worsening ETOH withdrawal PCCM consulted and phenobarb taper started   Interim History / Subjective:  Sleepy post ativan administration   Objective   Blood pressure (!) 134/91, pulse (!) 124, temperature 98.6 F (37 C), temperature source Axillary, resp. rate 15, height 6' (1.829 m), weight 66 kg, SpO2 (!) 88%.        Intake/Output Summary (Last 24 hours) at 08/26/2023 0919 Last data filed at 08/26/2023 0846 Gross per 24 hour  Intake 1601.06 ml  Output 3450 ml  Net -1848.94 ml   Filed Weights   08/22/23 1628  Weight: 66 kg    Examination: General: Acute on chronically ill  appearing deconditioned adult male lying in bed, in NAD HEENT: [place]/AT, MM pink/moist, PERRL,  Neuro: Slightly sedated post benso push will arouse to verbal stimuli, unable to follow commands  CV: s1s2 regular rate and rhythm, no murmur, rubs, or gallops,  PULM:  Clear to auscultation, no increased work of breathing, no added breath sounds GI: soft, bowel sounds active in all 4 quadrants, non-tender, non-distended Extremities: warm/dry, no edema  Skin: no rashes or lesions  Resolved Hospital Problem list     Assessment & Plan:  Severe alcohol use disorder in acute withdrawal with history of withdrawal seizure -Last drink 10/13 PTA Hyperbilirubinemia P: Start phenobarbital taper  Continue CIWA protocol Seizure precautions  Precedex for symptom management as needed  NPO Aspiration precautions  Stop librium taper in favor of the phenobarb  Supplement thiamine, folate, and MVI   Acute alcohol induced pancreatitis  -Acute pancreatitis seen on admission CT without evidence pf necrosis,  with lipase 699 on admit  P: Continue IV hydration  NPO until mentation improves  Pain control   HIV  -CD4 248, reportable not taking medications x1 month prior to admission  P: Continue Biktarvy when able to safely take oral medications   Mood disorder History of Bipolar disorder  P: Psychiatry following  Withdrawal management as above   Bladder wall thickening -No signs of UTI at present  P: Supportive care    Pancytopenia: Chronic P: Trend CBC    Hyponatremia Hypokalemia Hypomagnesemia Hypocalcemia P: Trend Bmet Supplement as needed   Moderate to severe protein calorie malnutrition  Hypoalbuminemia P: When able/safe try placement of NGT vs TPN as patient mentaiton will allow, he  has been refusing  Protein supplementation    Best Practice (right click and "Reselect all SmartList Selections" daily)   Diet/type: NPO DVT prophylaxis: prophylactic heparin  GI prophylaxis:  N/A Lines: N/A Foley:  N/A Code Status:  full code Last date of multidisciplinary goals of care discussion Pending   Labs   CBC: Recent Labs  Lab 08/22/23 2159 08/23/23 0641 08/24/23 0253 08/25/23 0301  WBC 2.5* 2.4* 2.4* 2.4*  NEUTROABS 1.6*  --   --   --   HGB 10.1* 10.2* 9.9* 10.3*  HCT 30.0* 30.6* 31.5* 32.2*  MCV 99.0 101.0* 105.7* 104.9*  PLT 102* 110* 94* 105*    Basic Metabolic Panel: Recent Labs  Lab 08/22/23 2159 08/23/23 0641 08/24/23 0253 08/25/23 0301 08/26/23 0434  NA 133* 132* 128* 133* 132*  K 3.4* 3.7 3.7 4.0 3.5  CL 98 98 94* 98 97*  CO2 24 26 24 22 26   GLUCOSE 116* 86 73 63* 116*  BUN 7 6 6  <5* <5*  CREATININE 0.52* 0.46* 0.42* 0.53* 0.53*  CALCIUM 7.6* 8.0* 8.0* 8.3* 8.5*  MG  --  1.6* 1.9  --  1.7  PHOS  --   --  2.8  --  3.4   GFR: Estimated Creatinine Clearance: 119.2 mL/min (A) (by C-G formula based on SCr of 0.53 mg/dL (L)). Recent Labs  Lab 08/22/23 2159 08/23/23 0641 08/24/23 0253 08/25/23 0301  WBC 2.5* 2.4* 2.4* 2.4*    Liver Function Tests: Recent Labs  Lab 08/22/23 2159 08/23/23 0641 08/24/23 0253 08/25/23 0301 08/26/23 0434  AST 66* 56* 38 36 34  ALT 44 41 30 27 28   ALKPHOS 68 65 59 67 72  BILITOT 0.8 1.1 1.6* 1.5* 1.4*  PROT 6.2* 6.2* 6.0* 6.2* 6.7  ALBUMIN 3.2* 3.3* 2.9* 2.9* 3.1*   Recent Labs  Lab 08/22/23 2159  LIPASE 699*   No results for input(s): "AMMONIA" in the last 168 hours.  ABG    Component Value Date/Time   HCO3 21.4 05/26/2023 0459   TCO2 26 07/15/2022 0251   ACIDBASEDEF 2.3 (H) 05/26/2023 0459   O2SAT 94.9 05/26/2023 0459     Coagulation Profile: No results for input(s): "INR", "PROTIME" in the last 168 hours.  Cardiac Enzymes: No results for input(s): "CKTOTAL", "CKMB", "CKMBINDEX", "TROPONINI" in the last 168 hours.  HbA1C: Hgb A1c MFr Bld  Date/Time Value Ref Range Status  05/25/2023 12:43 AM 5.0 4.8 - 5.6 % Final    Comment:    (NOTE) Pre diabetes:           5.7%-6.4%  Diabetes:              >6.4%  Glycemic control for   <7.0% adults with diabetes   12/15/2022 07:37 AM 5.3 4.8 - 5.6 % Final    Comment:    (NOTE) Pre diabetes:          5.7%-6.4%  Diabetes:              >6.4%  Glycemic control for   <7.0% adults with diabetes     CBG: Recent Labs  Lab 08/25/23 0859 08/25/23 2331 08/26/23 0530  GLUCAP 66* 101* 130*    Review of Systems:   Unable to assess   Past Medical History:  He,  has a past medical history of Alcohol abuse, Alcohol-induced pancreatitis (04/16/2022), Anxiety, Bipolar 2 disorder (HCC), HIV (human immunodeficiency virus infection) (HCC), Pancreatitis, PTSD (post-traumatic stress disorder), Schizophrenia (HCC), Seizures (HCC), and Subdural hematoma (HCC).  Surgical History:   Past Surgical History:  Procedure Laterality Date   BIOPSY  04/19/2022   Procedure: BIOPSY;  Surgeon: Meridee Score Netty Starring., MD;  Location: [facility] ENDOSCOPY;  Service: Gastroenterology;;   ENTEROSCOPY N/A 04/19/2022   Procedure: ENTEROSCOPY;  Surgeon: Meridee Score Netty Starring., MD;  Location: [facility] ENDOSCOPY;  Service: Gastroenterology;  Laterality: N/A;   INCISION AND DRAINAGE PERIRECTAL ABSCESS N/A 09/24/2016   Procedure: IRRIGATION AND DEBRIDEMENT PERIRECTAL ABSCESS;  Surgeon: Ricarda Frame, MD;  Location: [facility] ORS;  Service: General;  Laterality: N/A;   none       Social History:   reports that he has been smoking cigarettes. He has never used smokeless tobacco. He reports current alcohol use of about 105.0 standard drinks of alcohol per week. He reports current drug use. Drug: Methamphetamines.   Family History:  His family history includes Alcohol abuse in his father and mother; Cancer in an other family member; Colon cancer in an other family member; Other in an other family member.   Allergies Allergies  Allergen Reactions   Tegretol [Carbamazepine] Other (See Comments)    Caused vertigo for 2 days after taking it   Caffeine  Palpitations     Home Medications  Prior to Admission medications   Medication Sig Start Date End Date Taking? Authorizing Provider  bictegravir-emtricitabine-tenofovir AF (BIKTARVY) 50-200-25 MG TABS tablet Take 1 tablet by mouth daily.   Yes [provider]  levETIRAcetam (KEPPRA) 500 MG tablet Take 1 tablet (500 mg total) by mouth 2 (two) times daily. 08/03/23  Yes Dione Booze, MD  famotidine (PEPCID) 20 MG tablet Take 1 tablet (20 mg total) by mouth 2 (two) times daily. Patient not taking: Reported on 06/01/2019 05/14/19 06/02/19  Benjiman Core, MD     Critical care time:   Performed by: Carime Dinkel D. Harris  Total critical care time: 40 minutes  Critical care time was exclusive of separately billable procedures and treating other patients.  Critical care was necessary to treat or prevent imminent or life-threatening deterioration.  Critical care was time spent personally by me on the following activities: development of treatment plan with patient and/or surrogate as well as nursing, discussions with consultants, evaluation of patient's response to treatment, examination of patient, obtaining history from patient or surrogate, ordering and performing treatments and interventions, ordering and review of laboratory studies, ordering and review of radiographic studies, pulse oximetry and re-evaluation of patient's condition.  Venecia Mehl D. Harris, NP-C  Pulmonary & Critical Care Personal contact information can be found on Amion  If no contact or response made please call [phone] 08/26/2023, 10:28 AM

## 2023-08-26 NOTE — Progress Notes (Signed)
       CROSS COVER NOTE  NAME: Gary [name] MRN: [id number] DOB : [birth year]    Date of Service   08/26/2023   HPI/Events of Note   Nurse paged "he is still agitated. sustaining in the 150s-160s now. he is having increased hallucinations and delusions and attempting to remove mitts and get out of bed". Patient initially was on CIWA protocol with lorazepam.  No current active order for lorazepam.  Interventions   Plan: Ordered SDU CIWA and appropriate lorazepam dosing Care team to reassess and continue as needed.       Edu On [name], MSN, APRN, AGACNP-BC [facility] [place] Pager: [phone]. Check Amion for Availability

## 2023-08-27 DIAGNOSIS — F10939 Alcohol use, unspecified with withdrawal, unspecified: Secondary | ICD-10-CM | POA: Diagnosis not present

## 2023-08-27 DIAGNOSIS — K852 Alcohol induced acute pancreatitis without necrosis or infection: Secondary | ICD-10-CM | POA: Diagnosis not present

## 2023-08-27 LAB — COMPREHENSIVE METABOLIC PANEL
ALT: 26 U/L (ref 0–44)
AST: 30 U/L (ref 15–41)
Albumin: 3 g/dL — ABNORMAL LOW (ref 3.5–5.0)
Alkaline Phosphatase: 69 U/L (ref 38–126)
Anion gap: 12 (ref 5–15)
BUN: <5 mg/dL — ABNORMAL LOW (ref 6–20)
CO2: 22 mmol/L (ref 22–32)
Calcium: 8.4 mg/dL — ABNORMAL LOW (ref 8.9–10.3)
Chloride: 97 mmol/L — ABNORMAL LOW (ref 98–111)
Creatinine, Ser: 0.49 mg/dL — ABNORMAL LOW (ref 0.61–1.24)
GFR, Estimated: >60 mL/min (ref >60)
Glucose, Bld: 110 mg/dL — ABNORMAL HIGH (ref 70–99)
Potassium: 3.6 mmol/L (ref 3.5–5.1)
Sodium: 131 mmol/L — ABNORMAL LOW (ref 135–145)
Total Bilirubin: 1.1 mg/dL (ref 0.3–1.2)
Total Protein: 6.3 g/dL — ABNORMAL LOW (ref 6.5–8.1)

## 2023-08-27 LAB — GLUCOSE, CAPILLARY
Glucose-Capillary: 101 mg/dL — ABNORMAL HIGH (ref 70–99)
Glucose-Capillary: 101 mg/dL — ABNORMAL HIGH (ref 70–99)
Glucose-Capillary: 103 mg/dL — ABNORMAL HIGH (ref 70–99)
Glucose-Capillary: 120 mg/dL — ABNORMAL HIGH (ref 70–99)
Glucose-Capillary: 87 mg/dL (ref 70–99)
Glucose-Capillary: 93 mg/dL (ref 70–99)
Glucose-Capillary: 97 mg/dL (ref 70–99)

## 2023-08-27 LAB — CBC
HCT: 33.5 % — ABNORMAL LOW (ref 39.0–52.0)
Hemoglobin: 10.8 g/dL — ABNORMAL LOW (ref 13.0–17.0)
MCH: 33.3 pg (ref 26.0–34.0)
MCHC: 32.2 g/dL (ref 30.0–36.0)
MCV: 103.4 fL — ABNORMAL HIGH (ref 80.0–100.0)
Platelets: 145 10*3/uL — ABNORMAL LOW (ref 150–400)
RBC: 3.24 MIL/uL — ABNORMAL LOW (ref 4.22–5.81)
RDW: 15.3 % (ref 11.5–15.5)
WBC: 2.3 10*3/uL — ABNORMAL LOW (ref 4.0–10.5)
nRBC: 0 % (ref 0.0–0.2)

## 2023-08-27 MED ORDER — PHENOBARBITAL 32.4 MG PO TABS
32.4000 mg | ORAL_TABLET | Freq: Three times a day (TID) | ORAL | Status: AC
  Administered 2023-08-29: 32.4 mg via ORAL
  Filled 2023-08-27: qty 1

## 2023-08-27 MED ORDER — LACTATED RINGERS IV BOLUS
500.0000 mL | Freq: Once | INTRAVENOUS | Status: AC
  Administered 2023-08-27: 500 mL via INTRAVENOUS

## 2023-08-27 MED ORDER — LACTATED RINGERS IV SOLN: INTRAVENOUS | Status: AC

## 2023-08-27 MED ORDER — DEXTROSE 50 % IV SOLN: 12.5000 g | INTRAVENOUS | Status: DC

## 2023-08-27 MED ORDER — PHENOBARBITAL SODIUM 65 MG/ML IJ SOLN
32.4000 mg | Freq: Three times a day (TID) | INTRAMUSCULAR | Status: AC
  Administered 2023-08-28 – 2023-08-29 (×5): 32.4 mg via INTRAVENOUS
  Filled 2023-08-27 (×5): qty 1

## 2023-08-27 NOTE — Plan of Care (Signed)
[facility] Tonsillectomy/Adenoidectomy, Postoperative PEDS care plan entered in error.

## 2023-08-27 NOTE — Consult Note (Signed)
NAME:  Gary [name], MRN:  [id number], DOB:  30-Sep-1986, LOS: 5 ADMISSION DATE:  08/22/2023, CONSULTATION DATE:  08/26/2023 REFERRING MD:  [name] - [facility], CHIEF COMPLAINT:  ETOH withdrawal    History of Present Illness:  Gary [name] is a 37 y.o. male with a PMH significant for alcohol abuse  disorder with history of withdrawl seizures, alcohol induce pancreatitis, polysubstance use, bipolar 2 disorder, schizophrenia, PTSD, prior SDH, and homelessness who presented to the ED 08/22/2023 with complaints of abdominal pain with associated nausea and vomiting. Patient states symptoms felt similar to prior episodes of pancreatitis.   Patient was admitted per hospitilist service for management of acute alcohol induced pancreatitis.   By morning of 10/17 patient began displaying signs of worsening alcohol withdrawal despite CIWA protocol and frequent benzodiazepines doses. PCCM consulted for assistance in management   Pertinent  Medical History  Alcohol abuse  disorder with history of withdrawl seizures, alcohol induce pancreatitis, polysubstance use, bipolar 2 disorder, schizophrenia, PTSD, prior SDH, and homelessness  Significant Hospital Events: Including procedures, antibiotic start and stop dates in addition to other pertinent events   10/13 admitted for acute alcohol induce pancreatitis  10/17 worsening ETOH withdrawal PCCM consulted and phenobarb taper started   Interim History / Subjective:  Awake, drowsy, garbled speech but attempting to answers questions. Able to answer name  Objective   Blood pressure 103/75, pulse 86, temperature 98.7 F (37.1 C), temperature source Axillary, resp. rate 14, height 6' (1.829 m), weight 68.1 kg, SpO2 98%.        Intake/Output Summary (Last 24 hours) at 08/27/2023 0901 Last data filed at 08/27/2023 0500 Gross per 24 hour  Intake --  Output 2850 ml  Net -2850 ml   Filed Weights   08/22/23 1628 08/27/23 0500  Weight: 66 kg 68.1 kg    Physical Exam: General: Chronically ill-appearing, no acute distress HENT: [place], AT, OP clear, MMM Eyes: EOMI, no scleral icterus Respiratory: Clear to auscultation bilaterally.  No crackles, wheezing or rales Cardiovascular: RRR, -M/R/G, no JVD GI: BS+, soft, nontender Extremities:-Edema,-tenderness Neuro: Drowsy, eyes open to voice, answers to name, unable to follow commands, withdraws to noxious stimuli  WBC 2.3 stable  Resolved Hospital Problem list     Assessment & Plan:  Severe alcohol use disorder in acute withdrawal with history of withdrawal seizure -Last drink 10/13 PTA Hyperbilirubinemia P: Continue CIWA protocol Seizure precautions  Stop librium taper in favor of the phenobarb  Start phenobarbital taper  Continue keppra 500 mg BID NPO Aspiration precautions  Supplement thiamine, folate, and MVI   Acute alcohol induced pancreatitis  -Acute pancreatitis seen on admission CT without evidence pf necrosis,  with lipase 699 on admit  P: Continue IV hydration  NPO until mentation improves  Pain control   HIV  -CD4 248, reportable not taking medications x1 month prior to admission  P: Continue Biktarvy when able to safely take oral medications   Mood disorder History of Bipolar disorder  P: Psychiatry following  Withdrawal management as above   Bladder wall thickening -No signs of UTI at present  P: Supportive care    Pancytopenia: Chronic P: Trend CBC    Hyponatremia Hypokalemia Hypomagnesemia Hypocalcemia P: Trend Bmet Supplement as needed   Moderate to severe protein calorie malnutrition  Hypoalbuminemia P: When able/safe try placement of NGT vs TPN as patient mentaiton will allow, he has been refusing  Protein supplementation    Best Practice (right click and "Reselect all SmartList Selections"  daily)   Diet/type: NPO, per primaru DVT prophylaxis: prophylactic heparin  GI prophylaxis: N/A Lines: N/A Foley:  N/A Code Status:  full  code Last date of multidisciplinary goals of care discussion Pending    Critical care time:     The patient is critically ill with agitation requiring sedation and requires high complexity decision making for assessment and support, frequent evaluation and titration of therapies, application of advanced monitoring technologies and extensive interpretation of multiple databases.  Independent Critical Care Time: 30 Minutes.   Mechele Collin, M.D. [facility] Pulmonary/Critical Care Medicine 08/27/2023 9:01 AM   Please see Amion for pager number to reach on-call Pulmonary and Critical Care Team.

## 2023-08-27 NOTE — Progress Notes (Addendum)
[facility] PROGRESS NOTE  Gary [name] (DOB: 1986-04-04) OZD:[id number] PCP: Patient, No Pcp Per  Brief Narrative: Gary [name] is a 37 y.o. male with a history of HIV disease, seizure disorder, EtOH abuse, recurrent alcohol-associated pancreatitis, bipolar disorder, homelessness who presented to the ED on 08/22/2023 with nausea, vomiting and abdominal pain. He was tachycardic and hypertensive with serum EtOH 280, CT abd/pelvis consistent with acute pancreatitis. Admitted to SDU for acute recurrent pancreatitis and alcohol withdrawal. Attempt at noncaloric liquids on 10/15 was met with severe abdominal pain. Exam remains suggestive of severe pancreatitis, so PICC/TPN is started (pt vehemently declines postpyloric feeding tube). Due to growing agitation and behavioral issues emerging consistent with prior admissions, psychiatry is consulted for additional recommendations. Agitation worsening 10/17. Phenobarbital ordered and CCM consulted for consideration of precedex.   08/27/2023: Patient seen alongside patient's nurse.  Low blood pressure noted, with systolic blood pressures in the 80s.  Fluid bolus is going and 500 cc planned.  Systolic blood pressure has improved to 91 mmHg.  Poor historian.  Patient continues to refuse NG tube.  Have a low threshold to proceed with repeat CTA of the abdomen and pelvis if hypotension persists.  Critical care input is appreciated.  Labs reviewed today: CBC reveals WBC of 2.3, hemoglobin of 10.8, hematocrit of 33.5 and MCV of 103.4.  Chemistry reveals sodium of 131, potassium of 3.6, chloride of 97, CO2 of 22, BUN of less than 5, serum creatinine of 0.49, blood sugar 110, albumin of 3, AST of 30, ALT of 26, total protein of 6.3, total bilirubin of 1.1.  Last lipase level of 699 was noted on 08/22/2023.  Subjective: Poor historian. No significant history from patient.  Objective: Gen: Chronically ill looking.  Not in any distress.  Awake and alert.  Very  soft spoken.  Difficult to understand.  Poor historian.   HEENT: Patient is pale. Neck: Supple. Lungs: Clear to auscultation. CVS: S1-S2. Abdomen: Soft and nontender. Neuro: Awake and alert. Extremities no edema.     Assessment & Plan: Principal Problem:   Acute alcoholic pancreatitis Active Problems:   HIV disease (HCC)   Alcohol use disorder, severe, dependence (HCC)   History of seizures   Pancytopenia (HCC)  Acute recurrent alcohol associated pancreatitis: CT without fluid collection, and not suggestive of necrosis (though was without contrast) at admission.  - Continue NPO, IVF, IV analgesics and antiemetics. Failed attempt at noncaloric fluid 10/15. Recommended postpyloric feeding tube which patient declined, but agreed to PICC/TPN initially. Later refused this. Will continue monitoring clinical progress to decide on feeding possibilities.  08/27/2023:  See above documentation.  Alcohol abuse and withdrawal: EtOH detectable at admission, last drink 10/13 AM.  - Continue librium taper. This was changed to ativan 1mg , will change back to 25mg  and add phenobarbital. PCCM consulted in case he needs precedex. Soft restraints ordered as he has been severely abusive and threatening to staff and remains a danger to them and himself.  - Continue folic acid, thiamine, MVM 08/27/2023: No withdrawal symptoms noted.  Hypotension: -Volume resuscitation. -Low threshold to repeat CT abdomen and pelvis.  Seizure disorder:  - Keppra restarted   HIV disease: CD4 count 248, VL 47k on 07/18/23, has not been taking ART for last month.  - Restarted biktarvy, confirmed copay is $0.    Mood disorder, NOS:  - His agitation/behavioral issues have been noted in multiple prior hospitalizations. Current agitation strikes me more as psychiatric than due to withdrawal. I would appreciate  any recommendations psychiatry consultant has to offer. QTc .   Bladder wall thickening: No nitrites or leuk  esterase on UA. No urinary symptoms reported.   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 Cell lines down some with IV fluids.  - Monitoring with typical transfusion thresholds.   Hyponatremia:  - Continue isotonic fluids and monitor.   Hyperbilirubinemia: Mild - Continue monitoring.   Hypokalemia:  - Supplemented, will monitor, improving.  Hypomagnesemia:  - Supplemented, will monitor  Hypocalcemia: Supplemented. With hypoalbuminemia, corrects to 8.9 which is the lower limit of normal.   Bruising: Multiple significant bruises noted due to a fall and "taking hits". Plt and hgb stabilized. Will monitor.  Barnetta Chapel, MD [facility] [email or web address] 08/27/2023, 11:08 AM

## 2023-08-28 ENCOUNTER — Inpatient Hospital Stay (HOSPITAL_COMMUNITY): Payer: MEDICAID

## 2023-08-28 DIAGNOSIS — R451 Restlessness and agitation: Secondary | ICD-10-CM | POA: Diagnosis not present

## 2023-08-28 DIAGNOSIS — K852 Alcohol induced acute pancreatitis without necrosis or infection: Secondary | ICD-10-CM | POA: Diagnosis not present

## 2023-08-28 LAB — GLUCOSE, CAPILLARY
Glucose-Capillary: 78 mg/dL (ref 70–99)
Glucose-Capillary: 86 mg/dL (ref 70–99)
Glucose-Capillary: 177 mg/dL — ABNORMAL HIGH (ref 70–99)

## 2023-08-28 LAB — CBC WITH DIFFERENTIAL/PLATELET
Abs Immature Granulocytes: 0.02 10*3/uL (ref 0.00–0.07)
Basophils Absolute: 0 10*3/uL (ref 0.0–0.1)
Basophils Relative: 1 %
Eosinophils Absolute: 0 10*3/uL (ref 0.0–0.5)
Eosinophils Relative: 0 %
HCT: 32.9 % — ABNORMAL LOW (ref 39.0–52.0)
Hemoglobin: 10.9 g/dL — ABNORMAL LOW (ref 13.0–17.0)
Immature Granulocytes: 1 %
Lymphocytes Relative: 13 %
Lymphs Abs: 0.5 10*3/uL — ABNORMAL LOW (ref 0.7–4.0)
MCH: 33.1 pg (ref 26.0–34.0)
MCHC: 33.1 g/dL (ref 30.0–36.0)
MCV: 100 fL (ref 80.0–100.0)
Monocytes Absolute: 0.7 10*3/uL (ref 0.1–1.0)
Monocytes Relative: 17 %
Neutro Abs: 2.9 10*3/uL (ref 1.7–7.7)
Neutrophils Relative %: 68 %
Platelets: 185 10*3/uL (ref 150–400)
RBC: 3.29 MIL/uL — ABNORMAL LOW (ref 4.22–5.81)
RDW: 14.2 % (ref 11.5–15.5)
WBC: 4.1 10*3/uL (ref 4.0–10.5)
nRBC: 0 % (ref 0.0–0.2)

## 2023-08-28 LAB — CBC
HCT: 34.6 % — ABNORMAL LOW (ref 39.0–52.0)
Hemoglobin: 11.2 g/dL — ABNORMAL LOW (ref 13.0–17.0)
MCH: 34 pg (ref 26.0–34.0)
MCHC: 32.4 g/dL (ref 30.0–36.0)
MCV: 105.2 fL — ABNORMAL HIGH (ref 80.0–100.0)
Platelets: 150 10*3/uL (ref 150–400)
RBC: 3.29 MIL/uL — ABNORMAL LOW (ref 4.22–5.81)
RDW: 14.8 % (ref 11.5–15.5)
WBC: 5.4 10*3/uL (ref 4.0–10.5)
nRBC: 0 % (ref 0.0–0.2)

## 2023-08-28 LAB — BASIC METABOLIC PANEL
Anion gap: 10 (ref 5–15)
BUN: 6 mg/dL (ref 6–20)
CO2: 24 mmol/L (ref 22–32)
Calcium: 8.5 mg/dL — ABNORMAL LOW (ref 8.9–10.3)
Chloride: 95 mmol/L — ABNORMAL LOW (ref 98–111)
Creatinine, Ser: 0.39 mg/dL — ABNORMAL LOW (ref 0.61–1.24)
GFR, Estimated: >60 mL/min (ref >60)
Glucose, Bld: 88 mg/dL (ref 70–99)
Potassium: 4.4 mmol/L (ref 3.5–5.1)
Sodium: 129 mmol/L — ABNORMAL LOW (ref 135–145)

## 2023-08-28 LAB — LACTIC ACID, PLASMA: Lactic Acid, Venous: 1.1 mmol/L (ref 0.5–1.9)

## 2023-08-28 MED ORDER — LACTATED RINGERS IV SOLN: INTRAVENOUS | Status: AC

## 2023-08-28 MED ORDER — SODIUM CHLORIDE 0.9 % IV SOLN
2.0000 g | Freq: Three times a day (TID) | INTRAVENOUS | Status: DC
  Administered 2023-08-29 – 2023-09-01 (×11): 2 g via INTRAVENOUS
  Filled 2023-08-28 (×11): qty 12.5

## 2023-08-28 MED ORDER — SENNOSIDES-DOCUSATE SODIUM 8.6-50 MG PO TABS
1.0000 | ORAL_TABLET | Freq: Two times a day (BID) | ORAL | Status: DC
  Administered 2023-08-28 – 2023-09-01 (×8): 1 via ORAL
  Filled 2023-08-28 (×9): qty 1

## 2023-08-28 MED ORDER — SODIUM CHLORIDE 0.9 % IV BOLUS
1000.0000 mL | Freq: Once | INTRAVENOUS | Status: AC
  Administered 2023-08-29: 1000 mL via INTRAVENOUS

## 2023-08-28 MED ORDER — METRONIDAZOLE 500 MG/100ML IV SOLN
500.0000 mg | Freq: Two times a day (BID) | INTRAVENOUS | Status: DC
  Administered 2023-08-29 – 2023-09-01 (×7): 500 mg via INTRAVENOUS
  Filled 2023-08-28 (×8): qty 100

## 2023-08-28 NOTE — Plan of Care (Signed)
  Problem: Nutrition: Goal: Adequate nutrition will be maintained Outcome: Progressing   Problem: Safety: Goal: Ability to remain free from injury will improve Outcome: Progressing   Problem: Skin Integrity: Goal: Risk for impaired skin integrity will decrease Outcome: Progressing   Problem: Education: Goal: Knowledge of General Education information will improve Description: Including pain rating scale, medication(s)/side effects and non-pharmacologic comfort measures Outcome: Not Progressing   Problem: Clinical Measurements: Goal: Respiratory complications will improve Outcome: Not Progressing Goal: Cardiovascular complication will be avoided Outcome: Not Progressing   Problem: Activity: Goal: Risk for activity intolerance will decrease Outcome: Not Progressing   Problem: Elimination: Goal: Will not experience complications related to bowel motility Outcome: Not Progressing Goal: Will not experience complications related to urinary retention Outcome: Not Progressing   Problem: Pain Managment: Goal: General experience of comfort will improve Outcome: Not Progressing

## 2023-08-28 NOTE — Plan of Care (Signed)
  Problem: Nutrition: Goal: Adequate nutrition will be maintained Outcome: Progressing Note: Advanced from clear liquid diet to full liquid diet today.   Problem: Elimination: Goal: Will not experience complications related to bowel motility Outcome: Not Progressing Note: Still had no bowel movement.   Let MD know to start bowel regimen.

## 2023-08-28 NOTE — Progress Notes (Signed)
Pharmacy Antibiotic Note  Gary [name] is a 37 y.o. male admitted on 08/22/2023 with acute alcoholic pancreatitis.  Pharmacy has been consulted to dose cefepime for fever of unknown source.  Plan: Cefepime 2gm IV q8h Follow renal function, cultures and clinical course  Height: 6' (182.9 cm) Weight: 68.4 kg (150 lb 12.7 oz) (70.2-1.8) IBW/kg (Calculated) : 77.6  Temp (24hrs), Avg:100 F (37.8 C), Min:97.7 F (36.5 C), Max:102.3 F (39.1 C)  Recent Labs  Lab 08/24/23 0253 08/25/23 0301 08/26/23 0434 08/27/23 0257 08/28/23 0859 08/28/23 2043  WBC 2.4* 2.4*  --  2.3* 5.4 4.1  CREATININE 0.42* 0.53* 0.53* 0.49* 0.39*  --   LATICACIDVEN  --   --   --   --   --  1.1    Estimated Creatinine Clearance: 123.5 mL/min (A) (by C-G formula based on SCr of 0.39 mg/dL (L)).    Allergies  Allergen Reactions   Tegretol [Carbamazepine] Other (See Comments)    Caused vertigo for 2 days after taking it   Caffeine Palpitations     Thank you for allowing pharmacy to be a part of this patient's care.  Arley Phenix RPh 08/28/2023, 11:41 PM

## 2023-08-28 NOTE — Progress Notes (Incomplete)
    Patient Name: Gary [name]           DOB: June 28, 1986  MRN: [id number]      Admission Date: 08/22/2023  Attending Provider: Lanae Boast, MD  Primary Diagnosis: Acute alcoholic pancreatitis   Level of care: Stepdown    CROSS COVER NOTE   Date of Service   08/28/2023   Gary [name], 37 y.o. male, was admitted on 08/22/2023 for Acute alcoholic pancreatitis.    HPI/Events of Note   Febrile, 102.3 Hemodynamically stable, no acute changes reported by nursing staff. Previous WBC 5.4 with low-grade temp only. Started workup for fever of unknown source.  Includes CBC, UA, chest x-ray, BC.  Bedside evaluation- Patient is awake, A/O to self and place, with no associated distress. Reports weakness and chills.  Respiratory: Bilaterally clear breath sounds. Normal effort. No accessory muscle use.  Cardiovascular: RRR.  2+ pedal pulses. No carotid bruits, JVD. Abdomen: Abdomen is soft and nontender. Positive bowel sounds noted in all quadrants.      ADDENDUM-  Lactic acid negative, WBC 4.1, BP trending down. SBP 80-90's.  Intermittent hypotension thought to be related to phenobarbital use. Will administer 1 L fluid bolus and start cefepime and Flagyl.  Day team to follow-up on antibiotic needs.   Interventions/ Plan   Labs--> CBC, lactic acid, blood cultures Urinalysis Chest x-ray Fluid bolus, 1.5 L total Broad-spectrum ABX        Gary Harada, DNP, ACNPC- AG [facility] [place]

## 2023-08-28 NOTE — Progress Notes (Signed)
PROGRESS NOTE Gary [name]  VWU:[id number] DOB: [birth year] DOA: 08/22/2023 PCP: Patient, No Pcp Per  Brief Narrative/Hospital Course: 37 y.o. male with medical history significant for alcohol use disorder with hx of withdrawal seizures, polysubstance use, recurrent alcohol associated pancreatitis, HIV, PTSD, anxiety, bipolar disorder, homelessness who presented to ED due to 9/13 with nausea vomiting abdominal pain.  He drinks about 2/5 of liquor daily and could not get any Lasix since being a Sunday so he drank 2 of 40 ounce malt liquor then started having severe epigastric pain nausea vomiting In the ED vitals stable internal to 80 UDS positive for opiate, labs showed hyponatremia, lipase 699 albumin 3.3 WBC 2.4 hemoglobin 10.2 plt 110. CT abdomen/pelvis without contrast>>>acute pancreatitis. Markedly thickened and inflamed proximal portion of the duodenum likely secondary to adjacent pancreatitis noted. Mild to moderate severity gallbladder distention without evidence of gallstones or biliary dilatation. Hepatic steatosis seen. Mild to moderate severity diffuse urinary bladder wall thickening also seen.  Patient is admitted for acute pancreatitis, alcohol abuse and alcohol withdrawal to SDU. Due to ongoing agitation and behavioral issues psychiatry was consulted, subsequently PCCM was consulted due to worsening alcohol withdrawal despite CIWA protocol and frequent benzodiazepine doses, phenobarbital taper initiated     Subjective: Seen and examined, Overnight vitals/labs/events reviewed -has been afebrile BP in 90s to low 100, on 2 L nasal cannula No labs this morning ordered routine labs On Keppra phenobarbital taper Having abdominal pain but no nausea vomiting tolerating clear liquid diet and wants to try solid  Assessment and Plan: Principal Problem:   Acute alcoholic pancreatitis Active Problems:   Alcohol use disorder, severe, dependence (HCC)   History of seizures   HIV disease  (HCC)   Pancytopenia (HCC)   Severe alcohol use disorder Acute alcohol withdrawal History of withdrawal seizure: Patient with chronic alcohol abuse last drink 10/13 PTA.psychiatry was consulted, subsequently PCCM was consulted due to worsening alcohol withdrawal despite CIWA protocol and frequent benzodiazepine doses, phenobarbital taper initiated. Continue home Keppra, seizure precaution.  Has been off Librium taper potassium supplement thiamine folate MVI Monitor in SDU per PCCM  Acute alcohol induced pancreatitis: No necrosis on CT on admission.  Continue symptomatic conservative management clear liquid diet, ADA T, continue pain control, antiemetics, IV fluids  Hypotension: BP has improved IVFas needed.  He received bolus on 10/18.  HIV: Noncompliant with meds time 1 month, CD 248 07/18/23  Low-grade temperature: No leukocytosis or leukopenia CBC this morning with WBC 5.4.  Monitor, order UA/chest x-ray blood cultures if fever again.  Also has pancreatitis  Mood disorder Bipolar disorder: Mood stable.  Psychiatry following  Chronic pancytopenia Multiple bruising due to thrombocytopenia/fall " taking hits": Monitor CBC routinely Recent Labs  Lab 08/24/23 0253 08/25/23 0301 08/27/23 0257  HGB 9.9* 10.3* 10.8*  HCT 31.5* 32.2* 33.5*  WBC 2.4* 2.4* 2.3*  PLT 94* 105* 145*    Hyponatremia Hypokalemia Hypocalcemia Hypomagnesemia: Electrolytes have +improved  Severe malnutrition: Augment diet as able to take p.o. refused PICC line and TPN Nutrition Problem: Severe Malnutrition Etiology: chronic illness Signs/Symptoms: severe fat depletion, severe muscle depletion Interventions: Refer to RD note for recommendations, TPN    DVT prophylaxis: heparin injection 5,000 Units Start: 08/22/23 2200 Code Status:   Code Status: Full Code Family Communication: plan of care discussed with patient at bedside. Patient status is: Inpatient because of alcohol withdrawal Level of  care: Stepdown   Dispo: The patient is from: Homeless  Anticipated disposition: TBD Objective: Vitals last 24 hrs: Vitals:   08/28/23 0500 08/28/23 0530 08/28/23 0600 08/28/23 0630  BP: (!) 99/50 101/66 103/73 94/65  Pulse: 81 94 92 (!) 102  Resp: 15 19 13 14   Temp:      TempSrc:      SpO2: 100% 91% 93% 96%  Weight:      Height:       Weight change: 0.3 kg  Physical Examination: General exam: alert awake, thin and frail, older than stated age HEENT:Oral mucosa moist, Ear/Nose WNL grossly Respiratory system: bilaterally clear BS, no use of accessory muscle Cardiovascular system: S1 & S2 +, No JVD. Gastrointestinal system: Abdomen soft,NT,ND, BS+ Nervous System:Alert, awake, moving extremities. Extremities: LE edema neg,distal peripheral pulses palpable.  Skin: No rashes,no icterus. MSK: Normal muscle bulk,tone, power  Medications reviewed:  Scheduled Meds:  bictegravir-emtricitabine-tenofovir AF  1 tablet Oral Daily   Chlorhexidine Gluconate Cloth  6 each Topical QHS   folic acid  1 mg Oral Daily   heparin  5,000 Units Subcutaneous Q8H   levETIRAcetam  500 mg Oral BID   multivitamin with minerals  1 tablet Oral QHS   nicotine  14 mg Transdermal Daily   phenobarbital  32.4 mg Oral TID   Or   PHENObarbital  32.4 mg Intravenous TID   sodium chloride flush  3 mL Intravenous Q12H   thiamine  100 mg Oral Daily   Or   thiamine  100 mg Intravenous Daily   Continuous Infusions:    Diet Order             Diet clear liquid Room service appropriate? Yes; Fluid consistency: Thin  Diet effective now                    Nutrition Problem: Severe Malnutrition Etiology: chronic illness Signs/Symptoms: severe fat depletion, severe muscle depletion Interventions: Refer to RD note for recommendations, TPN   Intake/Output Summary (Last 24 hours) at 08/28/2023 0826 Last data filed at 08/28/2023 0500 Gross per 24 hour  Intake 664.42 ml  Output 700 ml  Net  -35.58 ml   Net IO Since Admission: -1,439.73 mL [08/28/23 0826]  Wt Readings from Last 3 Encounters:  08/28/23 68.4 kg  08/09/23 65 kg  08/08/23 65.5 kg     Unresulted Labs (From admission, onward)     Start     Ordered   08/22/23 1641  CBC with Differential  (ED Abdominal Pain)  Once,   STAT        08/22/23 1647           Data Reviewed: I have personally reviewed following labs and imaging studies CBC: Recent Labs  Lab 08/22/23 2159 08/23/23 0641 08/24/23 0253 08/25/23 0301 08/27/23 0257  WBC 2.5* 2.4* 2.4* 2.4* 2.3*  NEUTROABS 1.6*  --   --   --   --   HGB 10.1* 10.2* 9.9* 10.3* 10.8*  HCT 30.0* 30.6* 31.5* 32.2* 33.5*  MCV 99.0 101.0* 105.7* 104.9* 103.4*  PLT 102* 110* 94* 105* 145*   Basic Metabolic Panel: Recent Labs  Lab 08/23/23 0641 08/24/23 0253 08/25/23 0301 08/26/23 0434 08/27/23 0257  NA 132* 128* 133* 132* 131*  K 3.7 3.7 4.0 3.5 3.6  CL 98 94* 98 97* 97*  CO2 26 24 22 26 22   GLUCOSE 86 73 63* 116* 110*  BUN 6 6 <5* <5* <5*  CREATININE 0.46* 0.42* 0.53* 0.53* 0.49*  CALCIUM 8.0* 8.0* 8.3* 8.5* 8.4*  MG 1.6* 1.9  --  1.7  --   PHOS  --  2.8  --  3.4  --    GFR: Estimated Creatinine Clearance: 123.5 mL/min (A) (by C-G formula based on SCr of 0.49 mg/dL (L)). Liver Function Tests: Recent Labs  Lab 08/23/23 0641 08/24/23 0253 08/25/23 0301 08/26/23 0434 08/27/23 0257  AST 56* 38 36 34 30  ALT 41 30 27 28 26   ALKPHOS 65 59 67 72 69  BILITOT 1.1 1.6* 1.5* 1.4* 1.1  PROT 6.2* 6.0* 6.2* 6.7 6.3*  ALBUMIN 3.3* 2.9* 2.9* 3.1* 3.0*   Recent Labs  Lab 08/22/23 2159  LIPASE 699*   No results for input(s): "AMMONIA" in the last 168 hours. Coagulation Profile: No results for input(s): "HGBA1C" in the last 72 hours. CBG: Recent Labs  Lab 08/27/23 1203 08/27/23 1620 08/27/23 1959 08/27/23 2344 08/28/23 0311  GLUCAP 101* 87 101* 97 78   Lipid Profile: No results for input(s): "CHOL", "HDL", "LDLCALC", "TRIG", "CHOLHDL",  "LDLDIRECT" in the last 72 hours. Thyroid Function Tests: No results for input(s): "TSH", "T4TOTAL", "FREET4", "T3FREE", "THYROIDAB" in the last 72 hours. Sepsis Labs: No results for input(s): "PROCALCITON", "LATICACIDVEN" in the last 168 hours.  Recent Results (from the past 240 hour(s))  MRSA Next Gen by PCR, Nasal     Status: None   Collection Time: 08/23/23  4:08 AM   Specimen: Nasal Mucosa; Nasal Swab  Result Value Ref Range Status   MRSA by PCR Next Gen NOT DETECTED NOT DETECTED Final    Comment: (NOTE) The GeneXpert MRSA Assay (FDA approved for NASAL specimens only), is one component of a comprehensive MRSA colonization surveillance program. It is not intended to diagnose MRSA infection nor to guide or monitor treatment for MRSA infections. Test performance is not FDA approved in patients less than 81 years old. Performed at [facility], [address], [place], [place] [address]     Antimicrobials: Anti-infectives (From admission, onward)    Start     Dose/Rate Route Frequency Ordered Stop   08/23/23 0800  bictegravir-emtricitabine-tenofovir AF (BIKTARVY) 50-200-25 MG per tablet 1 tablet        1 tablet Oral Daily 08/22/23 2153        Culture/Microbiology    Component Value Date/Time   SDES BLOOD RIGHT ARM 04/25/2022 2122   SPECREQUEST  04/25/2022 2122    BOTTLES DRAWN AEROBIC AND ANAEROBIC Blood Culture adequate volume   CULT  04/25/2022 2122    NO GROWTH 5 DAYS Performed at [facility], [address], [place], [place] [address]    REPTSTATUS 04/30/2022 FINAL 04/25/2022 2122   Radiology Studies: No results found.   LOS: 6 days   Lanae Boast, MD [facility]  08/28/2023, 8:26 AM

## 2023-08-28 NOTE — Consult Note (Signed)
NAME:  Gary [name], MRN:  [id number], DOB:  1986/03/17, LOS: 6 ADMISSION DATE:  08/22/2023, CONSULTATION DATE:  08/26/2023 REFERRING MD:  [name] - [facility], CHIEF COMPLAINT:  ETOH withdrawal    History of Present Illness:  Gary [name] is a 37 y.o. male with a PMH significant for alcohol abuse  disorder with history of withdrawl seizures, alcohol induce pancreatitis, polysubstance use, bipolar 2 disorder, schizophrenia, PTSD, prior SDH, and homelessness who presented to the ED 08/22/2023 with complaints of abdominal pain with associated nausea and vomiting. Patient states symptoms felt similar to prior episodes of pancreatitis.   Patient was admitted per hospitilist service for management of acute alcohol induced pancreatitis.   By morning of 10/17 patient began displaying signs of worsening alcohol withdrawal despite CIWA protocol and frequent benzodiazepines doses. PCCM consulted for assistance in management   Pertinent  Medical History  Alcohol abuse  disorder with history of withdrawl seizures, alcohol induce pancreatitis, polysubstance use, bipolar 2 disorder, schizophrenia, PTSD, prior SDH, and homelessness  Significant Hospital Events: Including procedures, antibiotic start and stop dates in addition to other pertinent events   10/13 admitted for acute alcohol induce pancreatitis  10/17 worsening ETOH withdrawal PCCM consulted and phenobarb taper started   Interim History / Subjective:  Improving mentation on phenobarb taper Tolerating clear liquid diet Objective   Blood pressure 94/65, pulse (!) 102, temperature 99 F (37.2 C), temperature source Oral, resp. rate 14, height 6' (1.829 m), weight 68.4 kg, SpO2 96%.        Intake/Output Summary (Last 24 hours) at 08/28/2023 0822 Last data filed at 08/28/2023 0500 Gross per 24 hour  Intake 664.42 ml  Output 700 ml  Net -35.58 ml   Filed Weights   08/22/23 1628 08/27/23 0500 08/28/23 0416  Weight: 66 kg 68.1 kg 68.4 kg    Physical Exam: General: Chronically ill-appearing, no acute distress HENT: [place], AT Eyes: EOMI, no scleral icterus Respiratory: Clear to auscultation bilaterally.  No crackles, wheezing or rales Cardiovascular: RRR, -M/R/G, no JVD Extremities:-Edema,-tenderness Neuro: Drowsy, awakens with voice, speech more clear and able to answer simple questions, does not consistently follow commands  No labs today  Resolved Hospital Problem list     Assessment & Plan:  Severe alcohol use disorder in acute withdrawal with history of withdrawal seizure -Last drink 10/13 PTA Hyperbilirubinemia P: Continue CIWA protocol Seizure precautions  Stopped librium taper in favor of the phenobarb  Start phenobarbital taper 10/17. Taper Continue keppra 500 mg BID Aspiration precautions  Supplement thiamine, folate, and MVI   Intermittent hypotension secondary to phenobarb P: S/p IVF boluses 10/18 Monitor vitals Additional PRN bolus as needed Encourage PO intake when awake  Acute alcohol induced pancreatitis  -Acute pancreatitis seen on admission CT without evidence pf necrosis,  with lipase 699 on admit  P: Advance diet as tolerated Pain control   HIV  -CD4 248, reportable not taking medications x1 month prior to admission  P: Continue Biktarvy when able to safely take oral medications   Mood disorder History of Bipolar disorder  P: Psychiatry following  Withdrawal management as above   Bladder wall thickening -No signs of UTI at present  P: Supportive care    Pancytopenia: Chronic P: Trend CBC    Hyponatremia Hypokalemia Hypomagnesemia Hypocalcemia P: Trend Bmet Supplement as needed   Moderate to severe protein calorie malnutrition  Hypoalbuminemia P: When able/safe try placement of NGT vs TPN as patient mentaiton will allow, he has been refusing  Protein supplementation    Best Practice (right click and "Reselect all SmartList Selections" daily)   Diet/type: NPO,  per primary DVT prophylaxis: prophylactic heparin  GI prophylaxis: N/A Lines: N/A Foley:  N/A Code Status:  full code Last date of multidisciplinary goals of care discussion Pending    Critical care time:    The patient is critically ill with agitation requiring sedation and requires high complexity decision making for assessment and support, frequent evaluation and titration of therapies, application of advanced monitoring technologies and extensive interpretation of multiple databases.  Independent Critical Care Time: 30 Minutes.   Mechele Collin, M.D. [facility] Pulmonary/Critical Care Medicine 08/28/2023 8:23 AM   Please see Amion for pager number to reach on-call Pulmonary and Critical Care Team.   Mechele Collin, M.D. [facility] Pulmonary/Critical Care Medicine 08/28/2023 8:22 AM   Please see Amion for pager number to reach on-call Pulmonary and Critical Care Team.

## 2023-08-29 DIAGNOSIS — F10939 Alcohol use, unspecified with withdrawal, unspecified: Secondary | ICD-10-CM | POA: Diagnosis not present

## 2023-08-29 DIAGNOSIS — K852 Alcohol induced acute pancreatitis without necrosis or infection: Secondary | ICD-10-CM | POA: Diagnosis not present

## 2023-08-29 LAB — URINALYSIS, W/ REFLEX TO CULTURE (INFECTION SUSPECTED)
Bacteria, UA: NONE SEEN
Bilirubin Urine: NEGATIVE
Glucose, UA: NEGATIVE mg/dL
Hgb urine dipstick: NEGATIVE
Ketones, ur: NEGATIVE mg/dL
Leukocytes,Ua: NEGATIVE
Nitrite: NEGATIVE
Protein, ur: NEGATIVE mg/dL
Specific Gravity, Urine: 1.004 — ABNORMAL LOW (ref 1.005–1.030)
pH: 8 (ref 5.0–8.0)

## 2023-08-29 LAB — COMPREHENSIVE METABOLIC PANEL
ALT: 49 U/L — ABNORMAL HIGH (ref 0–44)
AST: 57 U/L — ABNORMAL HIGH (ref 15–41)
Albumin: 2.7 g/dL — ABNORMAL LOW (ref 3.5–5.0)
Alkaline Phosphatase: 67 U/L (ref 38–126)
Anion gap: 7 (ref 5–15)
BUN: <5 mg/dL — ABNORMAL LOW (ref 6–20)
CO2: 24 mmol/L (ref 22–32)
Calcium: 8.7 mg/dL — ABNORMAL LOW (ref 8.9–10.3)
Chloride: 100 mmol/L (ref 98–111)
Creatinine, Ser: 0.67 mg/dL (ref 0.61–1.24)
GFR, Estimated: >60 mL/min (ref >60)
Glucose, Bld: 103 mg/dL — ABNORMAL HIGH (ref 70–99)
Potassium: 4.3 mmol/L (ref 3.5–5.1)
Sodium: 131 mmol/L — ABNORMAL LOW (ref 135–145)
Total Bilirubin: 1.6 mg/dL — ABNORMAL HIGH (ref 0.3–1.2)
Total Protein: 6.2 g/dL — ABNORMAL LOW (ref 6.5–8.1)

## 2023-08-29 LAB — GLUCOSE, CAPILLARY
Glucose-Capillary: 80 mg/dL (ref 70–99)
Glucose-Capillary: 159 mg/dL — ABNORMAL HIGH (ref 70–99)
Glucose-Capillary: 100 mg/dL — ABNORMAL HIGH (ref 70–99)

## 2023-08-29 LAB — CBC
HCT: 33.1 % — ABNORMAL LOW (ref 39.0–52.0)
Hemoglobin: 10.8 g/dL — ABNORMAL LOW (ref 13.0–17.0)
MCH: 33.8 pg (ref 26.0–34.0)
MCHC: 32.6 g/dL (ref 30.0–36.0)
MCV: 103.4 fL — ABNORMAL HIGH (ref 80.0–100.0)
Platelets: 164 10*3/uL (ref 150–400)
RBC: 3.2 MIL/uL — ABNORMAL LOW (ref 4.22–5.81)
RDW: 14.5 % (ref 11.5–15.5)
WBC: 5.1 10*3/uL (ref 4.0–10.5)
nRBC: 0 % (ref 0.0–0.2)

## 2023-08-29 LAB — LACTIC ACID, PLASMA: Lactic Acid, Venous: 1.2 mmol/L (ref 0.5–1.9)

## 2023-08-29 LAB — PROCALCITONIN: Procalcitonin: 0.28 ng/mL

## 2023-08-29 MED ORDER — LORAZEPAM 2 MG/ML IJ SOLN
0.5000 mg | INTRAMUSCULAR | Status: DC | PRN
  Administered 2023-08-29 – 2023-08-31 (×8): 0.5 mg via INTRAVENOUS
  Filled 2023-08-29 (×9): qty 1

## 2023-08-29 MED ORDER — SODIUM CHLORIDE 0.9 % IV BOLUS
500.0000 mL | Freq: Once | INTRAVENOUS | Status: AC
  Administered 2023-08-29: 500 mL via INTRAVENOUS

## 2023-08-29 NOTE — Plan of Care (Signed)
  Problem: Education: Goal: Knowledge of General Education information will improve Description: Including pain rating scale, medication(s)/side effects and non-pharmacologic comfort measures Outcome: Progressing   Problem: Health Behavior/Discharge Planning: Goal: Ability to manage health-related needs will improve Outcome: Progressing   Problem: Clinical Measurements: Goal: Will remain free from infection Outcome: Progressing Goal: Diagnostic test results will improve Outcome: Progressing Goal: Respiratory complications will improve Outcome: Progressing Goal: Cardiovascular complication will be avoided Outcome: Progressing   Problem: Activity: Goal: Risk for activity intolerance will decrease Outcome: Progressing   Problem: Coping: Goal: Level of anxiety will decrease Outcome: Progressing   Problem: Elimination: Goal: Will not experience complications related to urinary retention Outcome: Progressing   Problem: Pain Managment: Goal: General experience of comfort will improve Outcome: Progressing   Problem: Safety: Goal: Ability to remain free from injury will improve Outcome: Progressing   Problem: Skin Integrity: Goal: Risk for impaired skin integrity will decrease Outcome: Progressing   Problem: Education: Goal: Ability to describe self-care measures that may prevent or decrease complications (Diabetes Survival Skills Education) will improve Outcome: Progressing Goal: Individualized Educational Video(s) Outcome: Progressing   Problem: Coping: Goal: Ability to adjust to condition or change in health will improve Outcome: Progressing   Problem: Fluid Volume: Goal: Ability to maintain a balanced intake and output will improve Outcome: Progressing   Problem: Health Behavior/Discharge Planning: Goal: Ability to identify and utilize available resources and services will improve Outcome: Progressing Goal: Ability to manage health-related needs will  improve Outcome: Progressing   Problem: Metabolic: Goal: Ability to maintain appropriate glucose levels will improve Outcome: Progressing   Problem: Skin Integrity: Goal: Risk for impaired skin integrity will decrease Outcome: Progressing   Problem: Tissue Perfusion: Goal: Adequacy of tissue perfusion will improve Outcome: Progressing   Problem: Safety: Goal: Violent Restraint(s) Outcome: Progressing   Problem: Safety: Goal: Non-violent Restraint(s) Outcome: Progressing   Problem: Clinical Measurements: Goal: Ability to maintain clinical measurements within normal limits will improve Outcome: Not Progressing   Problem: Nutrition: Goal: Adequate nutrition will be maintained Outcome: Not Progressing   Problem: Elimination: Goal: Will not experience complications related to bowel motility Outcome: Not Progressing   Problem: Nutritional: Goal: Maintenance of adequate nutrition will improve Outcome: Not Progressing Goal: Progress toward achieving an optimal weight will improve Outcome: Not Progressing

## 2023-08-29 NOTE — Progress Notes (Incomplete)
    Patient Name: Gary [name]           DOB: 1986-08-10  MRN: [id number]      Admission Date: 08/22/2023  Attending Provider: Lanae Boast, MD  Primary Diagnosis: Acute alcoholic pancreatitis   Level of care: Stepdown    CROSS COVER NOTE   Date of Service   08/28/2023   Gary [name], 37 y.o. male, was admitted on 08/22/2023 for Acute alcoholic pancreatitis.    HPI/Events of Note   Febrile, 102.3 Hemodynamically stable, no acute changes reported by nursing staff. Previous WBC 5.4 with low-grade temp only. Started workup for fever of unknown source.  Includes CBC, UA, chest x-ray, BC.  ADDENDUM-  Lactic acid negative, leukopenia WBC 4.1, BP trending down. SBP 80-90's.    Interventions/ Plan   Labs--> CBC, lactic acid, blood cultures Urinalysis Chest x-ray        Anthoney Harada, DNP, ACNPC- AG [facility]

## 2023-08-29 NOTE — Plan of Care (Signed)
  Problem: Clinical Measurements: Goal: Ability to maintain clinical measurements within normal limits will improve Outcome: Progressing Goal: Will remain free from infection Outcome: Progressing   Problem: Coping: Goal: Level of anxiety will decrease Outcome: Progressing   Problem: Elimination: Goal: Will not experience complications related to bowel motility Outcome: Progressing Goal: Will not experience complications related to urinary retention Outcome: Progressing

## 2023-08-29 NOTE — Progress Notes (Signed)
PROGRESS NOTE Gary [name]  ZOX:[id number] DOB: 03-May-1986 DOA: 08/22/2023 PCP: Patient, No Pcp Per  Brief Narrative/Hospital Course: 37 y.o. male with medical history significant for alcohol use disorder with hx of withdrawal seizures, polysubstance use, recurrent alcohol associated pancreatitis, HIV, PTSD, anxiety, bipolar disorder, homelessness who presented to ED due to 9/13 with nausea vomiting abdominal pain.  He drinks about 2/5 of liquor daily and could not get any Lasix since being a Sunday so he drank 2 of 40 ounce malt liquor then started having severe epigastric pain nausea vomiting In the ED vitals stable internal to 80 UDS positive for opiate, labs showed hyponatremia, lipase 699 albumin 3.3 WBC 2.4 hemoglobin 10.2 plt 110. CT abdomen/pelvis without contrast>>>acute pancreatitis. Markedly thickened and inflamed proximal portion of the duodenum likely secondary to adjacent pancreatitis noted. Mild to moderate severity gallbladder distention without evidence of gallstones or biliary dilatation. Hepatic steatosis seen. Mild to moderate severity diffuse urinary bladder wall thickening also seen.  Patient is admitted for acute pancreatitis, alcohol abuse and alcohol withdrawal to SDU. Due to ongoing agitation and behavioral issues psychiatry was consulted, subsequently PCCM was consulted due to worsening alcohol withdrawal despite CIWA protocol and frequent benzodiazepine doses, phenobarbital taper initiated     Subjective: Seen and examined, Overnight vitals/labs/events reviewed  Again febrile overnight and workup initiated including : PCT 0.2 borderline  lactic acid -1.2,WBC 5.1, he has a WBC 0-5, and chest x-ray Limited exam possible right infrahilar opacity advised PA lateral view once patient is able.flagyl and cefepime was ordered, also hypotensive in 80s, felt to be from phenobarb- got bolus ivf  Blood pressure stable now, he is alert awake conversant appears improved has ongoing  abdominal pain chronic pancreatitis but not worse Tolerating liquid diet but endorses vomiting yesterday.  Assessment and Plan: Principal Problem:   Acute alcoholic pancreatitis Active Problems:   Alcohol use disorder, severe, dependence (HCC)   History of seizures   HIV disease (HCC)   Pancytopenia (HCC)   Severe alcohol use disorder Acute alcohol withdrawal History of withdrawal seizure Transaminitis: Patient with chronic alcohol abuse last drink 10/13 PTA.psychiatry was consulted, subsequently PCCM was consulted due to worsening alcohol withdrawal despite CIWA protocol and frequent benzodiazepine doses, phenobarbital taper initiated. Mentation overall stable and doing well mild tremors present, continue on current phenobarbital taper per PCCM, continue Keppra, seizure precaution, encourage alcohol cessation. Cont thiamine folate MVI.  AST ALT 157/49 TB 1.6, monitor  Acute alcohol induced pancreatitis: No necrosis on CT on admission.  Continue symptomatic conservative management> diet ADAT on FLD.  Had vomiting previously but wants to continue current regimen and doing well antiemetics, IV fluids  Hypotension: BP was low needing IV fluid resuscitation overnight BP stable today.   HIV: Noncompliant with meds time 1 month, CD 248 07/18/23  Low-grade temperature: Again febrile overnight and workup initiated including : PCT 0.2 borderline  lactic acid -1.2,WBC 5.1, he has a WBC 0-5, and chest x-ray Limited exam possible right infrahilar opacity advised PA lateral view once patient is able.flagyl and cefepime was ordered, also hypotensive in 80s, felt to be from phenobarb- got bolus ivf .? If from pancreatitis-but abdomen is soft benign no worsening of pain.  Follow-up blood culture  Mood disorder Bipolar disorder: Mood stable.  Seen by psychiatry  Chronic pancytopenia Multiple bruising due to thrombocytopenia/fall " taking hits": CBC stable improved w/ mild anemia    Hyponatremia Hypokalemia Hypocalcemia Hypomagnesemia: Electrolytes stable, hyponatremia improving   Severe malnutrition: Augment diet as able to  take p.o. refused PICC line and TPN Nutrition Problem: Severe Malnutrition Etiology: chronic illness Signs/Symptoms: severe fat depletion, severe muscle depletion Interventions: Refer to RD note for recommendations, TPN   DVT prophylaxis: heparin injection 5,000 Units Start: 08/22/23 2200 Code Status:   Code Status: Full Code Family Communication: plan of care discussed with patient at bedside. Patient status is: Inpatient because of alcohol withdrawal Level of care: Stepdown   Dispo: The patient is from: Homeless            Anticipated disposition: TBD Objective: Vitals last 24 hrs: Vitals:   08/29/23 0414 08/29/23 0430 08/29/23 0500 08/29/23 0540  BP:  104/66 (!) 99/55 105/62  Pulse:  67 69 82  Resp:  16 17 (!) 25  Temp: 98.1 F (36.7 C)     TempSrc: Oral     SpO2:  100% 94% 100%  Weight:   64.9 kg   Height:       Weight change: -3.5 kg  Physical Examination: General exam: alert awake, oriented , mildly tremulous  HEENT:Oral mucosa moist, Ear/Nose WNL grossly Respiratory system: Bilaterally clear BS,no use of accessory muscle Cardiovascular system: S1 & S2 +, No JVD. Gastrointestinal system: Abdomen soft, mild generalized tenderness present nondistended  but full , BS sluggish Nervous System: Alert, awake, moving all extremities,and following commands. Extremities: LE edema neg,distal peripheral pulses palpable and warm.  Skin: No rashes,no icterus. MSK: Normal muscle bulk,tone, power   Medications reviewed:  Scheduled Meds:  bictegravir-emtricitabine-tenofovir AF  1 tablet Oral Daily   Chlorhexidine Gluconate Cloth  6 each Topical QHS   folic acid  1 mg Oral Daily   heparin  5,000 Units Subcutaneous Q8H   levETIRAcetam  500 mg Oral BID   multivitamin with minerals  1 tablet Oral QHS   phenobarbital  32.4 mg Oral  TID   Or   PHENObarbital  32.4 mg Intravenous TID   senna-docusate  1 tablet Oral BID   sodium chloride flush  3 mL Intravenous Q12H   thiamine  100 mg Oral Daily   Or   thiamine  100 mg Intravenous Daily   Continuous Infusions:  ceFEPime (MAXIPIME) IV Stopped (08/29/23 0042)   metronidazole Stopped (08/29/23 0202)      Diet Order             Diet full liquid Room service appropriate? Yes; Fluid consistency: Thin  Diet effective now                  Intake/Output Summary (Last 24 hours) at 08/29/2023 0807 Last data filed at 08/29/2023 0400 Gross per 24 hour  Intake 3088.4 ml  Output 1025 ml  Net 2063.4 ml   Net IO Since Admission: 863.67 mL [08/29/23 0807]  Wt Readings from Last 3 Encounters:  08/29/23 64.9 kg  08/09/23 65 kg  08/08/23 65.5 kg     Unresulted Labs (From admission, onward)     Start     Ordered   08/29/23 0500  CBC  Daily,   R     Question:  Specimen collection method  Answer:  Lab=Lab collect   08/28/23 0831   08/29/23 0500  Comprehensive metabolic panel  Daily,   R     Question:  Specimen collection method  Answer:  Lab=Lab collect   08/28/23 0831   08/22/23 1641  CBC with Differential  (ED Abdominal Pain)  Once,   STAT        08/22/23 1647  Data Reviewed: I have personally reviewed following labs and imaging studies CBC: Recent Labs  Lab 08/22/23 2159 08/23/23 0641 08/25/23 0301 08/27/23 0257 08/28/23 0859 08/28/23 2043 08/29/23 0119  WBC 2.5*   < > 2.4* 2.3* 5.4 4.1 5.1  NEUTROABS 1.6*  --   --   --   --  2.9  --   HGB 10.1*   < > 10.3* 10.8* 11.2* 10.9* 10.8*  HCT 30.0*   < > 32.2* 33.5* 34.6* 32.9* 33.1*  MCV 99.0   < > 104.9* 103.4* 105.2* 100.0 103.4*  PLT 102*   < > 105* 145* 150 185 164   < > = values in this interval not displayed.   Basic Metabolic Panel: Recent Labs  Lab 08/23/23 0641 08/24/23 0253 08/25/23 0301 08/26/23 0434 08/27/23 0257 08/28/23 0859 08/29/23 0119  NA 132* 128* 133* 132* 131*  129* 131*  K 3.7 3.7 4.0 3.5 3.6 4.4 4.3  CL 98 94* 98 97* 97* 95* 100  CO2 26 24 22 26 22 24 24   GLUCOSE 86 73 63* 116* 110* 88 103*  BUN 6 6 <5* <5* <5* 6 <5*  CREATININE 0.46* 0.42* 0.53* 0.53* 0.49* 0.39* 0.67  CALCIUM 8.0* 8.0* 8.3* 8.5* 8.4* 8.5* 8.7*  MG 1.6* 1.9  --  1.7  --   --   --   PHOS  --  2.8  --  3.4  --   --   --    GFR: Estimated Creatinine Clearance: 117.2 mL/min (by C-G formula based on SCr of 0.67 mg/dL). Liver Function Tests: Recent Labs  Lab 08/24/23 0253 08/25/23 0301 08/26/23 0434 08/27/23 0257 08/29/23 0119  AST 38 36 34 30 57*  ALT 30 27 28 26  49*  ALKPHOS 59 67 72 69 67  BILITOT 1.6* 1.5* 1.4* 1.1 1.6*  PROT 6.0* 6.2* 6.7 6.3* 6.2*  ALBUMIN 2.9* 2.9* 3.1* 3.0* 2.7*   Recent Labs  Lab 08/22/23 2159  LIPASE 699*  CBG: Recent Labs  Lab 08/27/23 2344 08/28/23 0311 08/28/23 1116 08/28/23 1930 08/29/23 0407  GLUCAP 97 78 86 177* 80   Recent Labs  Lab 08/28/23 2043 08/29/23 0119  PROCALCITON  --  0.28  LATICACIDVEN 1.1 1.2    Recent Results (from the past 240 hour(s))  MRSA Next Gen by PCR, Nasal     Status: None   Collection Time: 08/23/23  4:08 AM   Specimen: Nasal Mucosa; Nasal Swab  Result Value Ref Range Status   MRSA by PCR Next Gen NOT DETECTED NOT DETECTED Final    Comment: (NOTE) The GeneXpert MRSA Assay (FDA approved for NASAL specimens only), is one component of a comprehensive MRSA colonization surveillance program. It is not intended to diagnose MRSA infection nor to guide or monitor treatment for MRSA infections. Test performance is not FDA approved in patients less than 58 years old. Performed at [facility], [address] [place], [place] [address]   Culture, blood (x 2)     Status: None (Preliminary result)   Collection Time: 08/28/23  8:43 PM   Specimen: BLOOD RIGHT HAND  Result Value Ref Range Status   Specimen Description   Final    BLOOD RIGHT HAND Performed at [facility], [address], [place], [place] [address]    Special Requests   Final    BOTTLES DRAWN AEROBIC ONLY Blood Culture adequate volume Performed at [facility], [address] [place], [place] [address]  Culture   Final    NO GROWTH < 12 HOURS Performed at [facility], [address] [place], [place] [address]    Report Status PENDING  Incomplete  Culture, blood (x 2)     Status: None (Preliminary result)   Collection Time: 08/28/23  8:43 PM   Specimen: BLOOD RIGHT HAND  Result Value Ref Range Status   Specimen Description   Final    BLOOD RIGHT HAND Performed at [facility], [address], [place], [place] [address]    Special Requests   Final    BOTTLES DRAWN AEROBIC ONLY Blood Culture adequate volume Performed at [facility], [address], [place], [place] [address]    Culture   Final    NO GROWTH < 12 HOURS Performed at [facility], [address] [place], [place] [address]    Report Status PENDING  Incomplete    Antimicrobials: Anti-infectives (From admission, onward)    Start     Dose/Rate Route Frequency Ordered Stop   08/29/23 0015  metroNIDAZOLE (FLAGYL) IVPB 500 mg        500 mg 100 mL/hr over 60 Minutes Intravenous Every 12 hours 08/28/23 2320 09/05/23 0014   08/29/23 0015  ceFEPIme (MAXIPIME) 2 g in sodium chloride 0.9 % 100 mL IVPB        2 g 200 mL/hr over 30 Minutes Intravenous Every 8 hours 08/28/23 2325     08/23/23 0800  bictegravir-emtricitabine-tenofovir AF (BIKTARVY) 50-200-25 MG per tablet 1 tablet        1 tablet Oral Daily 08/22/23 2153        Culture/Microbiology    Component Value Date/Time   SDES  08/28/2023 2043    BLOOD RIGHT HAND Performed at [facility], [address], [place], [place] [address]    SDES  08/28/2023 2043    BLOOD RIGHT HAND Performed at [facility], [address], [place], [place] [address]    SPECREQUEST  08/28/2023 2043    BOTTLES  DRAWN AEROBIC ONLY Blood Culture adequate volume Performed at [facility], [address] [place], [place] [address]    SPECREQUEST  08/28/2023 2043    BOTTLES DRAWN AEROBIC ONLY Blood Culture adequate volume Performed at [facility], [address], [place], [place] [address]    CULT  08/28/2023 2043    NO GROWTH < 12 HOURS Performed at [facility], [address], [place], [place] [address]    CULT  08/28/2023 2043    NO GROWTH < 12 HOURS Performed at [facility], [address], [place], [place] [address]    REPTSTATUS PENDING 08/28/2023 2043   REPTSTATUS PENDING 08/28/2023 2043   Radiology Studies: [facility] Chest Port 1 View  Result Date: 08/28/2023 CLINICAL DATA:  Sepsis. EXAM: PORTABLE CHEST 1 VIEW COMPARISON:  Chest radiograph 05/26/2023. Included lung bases from abdominopelvic CT 08/22/2023 FINDINGS: Evaluation is technically limited due to significant patient rotation and low lung volumes. The heart size and mediastinal contours are obscured. Possible right infrahilar opacity. No pneumothorax. More detailed assessment is limited. IMPRESSION: Technically limited exam due to significant patient rotation and low lung volumes. Possible right infrahilar opacity. Recommend PA and lateral views when patient is able. Electronically Signed   By: Narda Rutherford M.D.   On: 08/28/2023 22:58     LOS: 7 days   Lanae Boast, MD [facility]  08/29/2023, 8:07  AM

## 2023-08-29 NOTE — Consult Note (Signed)
NAME:  Gary [name], MRN:  [id number], DOB:  10/03/1986, LOS: 7 ADMISSION DATE:  08/22/2023, CONSULTATION DATE:  08/26/2023 REFERRING MD:  [name] - [facility], CHIEF COMPLAINT:  ETOH withdrawal    History of Present Illness:  Gary [name] is a 37 y.o. male with a PMH significant for alcohol abuse  disorder with history of withdrawl seizures, alcohol induce pancreatitis, polysubstance use, bipolar 2 disorder, schizophrenia, PTSD, prior SDH, and homelessness who presented to the ED 08/22/2023 with complaints of abdominal pain with associated nausea and vomiting. Patient states symptoms felt similar to prior episodes of pancreatitis.   Patient was admitted per hospitilist service for management of acute alcohol induced pancreatitis.   By morning of 10/17 patient began displaying signs of worsening alcohol withdrawal despite CIWA protocol and frequent benzodiazepines doses. PCCM consulted for assistance in management   Pertinent  Medical History  Alcohol abuse  disorder with history of withdrawl seizures, alcohol induce pancreatitis, polysubstance use, bipolar 2 disorder, schizophrenia, PTSD, prior SDH, and homelessness  Significant Hospital Events: Including procedures, antibiotic start and stop dates in addition to other pertinent events   10/13 admitted for acute alcohol induce pancreatitis  10/17 worsening ETOH withdrawal PCCM consulted and phenobarb taper started  10/20 Completed phenobarb wean. Started on abx for sepsis  Interim History / Subjective:  Improved mentation and today is last day of phenobarb taper Intermittent hypotension initially thought from phenobarb however overnight had fever 102 and primary team started antibiotics for possible sepsis from ?pneumonia  Tolerating CLD with mild intermittent abdominal pain Objective   Blood pressure (!) 93/45, pulse 82, temperature 98.1 F (36.7 C), temperature source Oral, resp. rate 15, height 6' (1.829 m), weight 64.9 kg, SpO2  100%.        Intake/Output Summary (Last 24 hours) at 08/29/2023 0823 Last data filed at 08/29/2023 1308 Gross per 24 hour  Intake 3088.4 ml  Output 1485 ml  Net 1603.4 ml   Filed Weights   08/27/23 0500 08/28/23 0416 08/29/23 0500  Weight: 68.1 kg 68.4 kg 64.9 kg   Physical Exam: General: Chronically ill-appearing, no acute distress HENT: [place], AT Eyes: EOMI, no scleral icterus Respiratory: Clear to auscultation bilaterally.  No crackles, wheezing or rales Cardiovascular: RRR, -M/R/G, no JVD Extremities:-Edema,-tenderness Neuro: AAO x3, CNII-XII grossly intact, follows commands Psych: Normal mood, normal affect  Na 131 WBC 5.1  Resolved Hospital Problem list     Assessment & Plan:  Severe alcohol use disorder in acute withdrawal with history of withdrawal seizure Acute metabolic encephalopathy - improved -Last drink 10/13 PTA P: Continue CIWA protocol Seizure precautions  Stopped librium taper in favor of the phenobarb  Phenobarbital taper 10/17-10/20 Continue keppra 500 mg BID Aspiration precautions  Supplement thiamine, folate, and MVI   Intermittent hypotension secondary to phenobarb Fever 08/28/23. Cannot rule out sepsis P: S/p IVF boluses 10/18 and 10/19 Monitor vitals Started on broad spectrum abx and cultures per primary team Encourage PO intake when awake  Acute alcohol induced pancreatitis  Hyperbilirubinemia -Acute pancreatitis seen on admission CT without evidence pf necrosis,  with lipase 699 on admit  P: Per primary Advance diet as tolerated Pain control   HIV  -CD4 248, reportable not taking medications x1 month prior to admission  P: Continue Biktarvy when able to safely take oral medications   Mood disorder History of Bipolar disorder  P: Psychiatry following  Withdrawal management as above   Bladder wall thickening -No signs of UTI at present  P:  Supportive care    Pancytopenia: Chronic P: Trend CBC     Hyponatremia Hypokalemia Hypomagnesemia Hypocalcemia P: Trend Bmet Supplement as needed   Moderate to severe protein calorie malnutrition  Hypoalbuminemia P: When able/safe try placement of NGT vs TPN as patient mentaiton will allow, he has been refusing  Protein supplementation   Pulmonary team will sign off. Thank you for involving [place] in the care of your patient  Best Practice (right click and "Reselect all SmartList Selections" daily)   Diet/type: clear liquids, per primary DVT prophylaxis: prophylactic heparin  GI prophylaxis: N/A Lines: N/A Foley:  N/A Code Status:  full code Last date of multidisciplinary goals of care discussion Pending    Critical care time:    Care Time: 50 min  Mechele Collin, M.D. [facility] Pulmonary/Critical Care Medicine 08/29/2023 8:23 AM   See Amion for personal pager For hours between 7 PM to 7 AM, please call Elink for urgent questions

## 2023-08-30 DIAGNOSIS — K852 Alcohol induced acute pancreatitis without necrosis or infection: Secondary | ICD-10-CM | POA: Diagnosis not present

## 2023-08-30 LAB — GLUCOSE, CAPILLARY
Glucose-Capillary: 112 mg/dL — ABNORMAL HIGH (ref 70–99)
Glucose-Capillary: 134 mg/dL — ABNORMAL HIGH (ref 70–99)
Glucose-Capillary: 92 mg/dL (ref 70–99)
Glucose-Capillary: 94 mg/dL (ref 70–99)

## 2023-08-30 LAB — COMPREHENSIVE METABOLIC PANEL
ALT: 35 U/L (ref 0–44)
AST: 22 U/L (ref 15–41)
Albumin: 2.6 g/dL — ABNORMAL LOW (ref 3.5–5.0)
Alkaline Phosphatase: 57 U/L (ref 38–126)
Anion gap: 10 (ref 5–15)
BUN: 8 mg/dL (ref 6–20)
CO2: 22 mmol/L (ref 22–32)
Calcium: 8.8 mg/dL — ABNORMAL LOW (ref 8.9–10.3)
Chloride: 100 mmol/L (ref 98–111)
Creatinine, Ser: 0.74 mg/dL (ref 0.61–1.24)
GFR, Estimated: >60 mL/min (ref >60)
Glucose, Bld: 105 mg/dL — ABNORMAL HIGH (ref 70–99)
Potassium: 3.8 mmol/L (ref 3.5–5.1)
Sodium: 132 mmol/L — ABNORMAL LOW (ref 135–145)
Total Bilirubin: 1 mg/dL (ref 0.3–1.2)
Total Protein: 6.3 g/dL — ABNORMAL LOW (ref 6.5–8.1)

## 2023-08-30 LAB — CBC
HCT: 32.2 % — ABNORMAL LOW (ref 39.0–52.0)
Hemoglobin: 10.5 g/dL — ABNORMAL LOW (ref 13.0–17.0)
MCH: 33.2 pg (ref 26.0–34.0)
MCHC: 32.6 g/dL (ref 30.0–36.0)
MCV: 101.9 fL — ABNORMAL HIGH (ref 80.0–100.0)
Platelets: 221 10*3/uL (ref 150–400)
RBC: 3.16 MIL/uL — ABNORMAL LOW (ref 4.22–5.81)
RDW: 14.5 % (ref 11.5–15.5)
WBC: 3.6 10*3/uL — ABNORMAL LOW (ref 4.0–10.5)
nRBC: 0 % (ref 0.0–0.2)

## 2023-08-30 MED ORDER — BOOST / RESOURCE BREEZE PO LIQD CUSTOM
1.0000 | Freq: Three times a day (TID) | ORAL | Status: DC
  Administered 2023-08-30 – 2023-09-01 (×6): 1 via ORAL

## 2023-08-30 MED ORDER — GUAIFENESIN-DM 100-10 MG/5ML PO SYRP
5.0000 mL | ORAL_SOLUTION | ORAL | Status: DC | PRN
  Administered 2023-08-30: 5 mL via ORAL
  Filled 2023-08-30: qty 10

## 2023-08-30 NOTE — Progress Notes (Signed)
PROGRESS NOTE Gary [name]  GMW:[id number] DOB: 22-Jul-1986 DOA: 08/22/2023 PCP: Patient, No Pcp Per  Brief Narrative/Hospital Course: 37 y.o. male with medical history significant for alcohol use disorder with hx of withdrawal seizures, polysubstance use, recurrent alcohol associated pancreatitis, HIV, PTSD, anxiety, bipolar disorder, homelessness who presented to ED due to 9/13 with nausea vomiting abdominal pain.  He drinks about 2/5 of liquor daily and could not get any Lasix since being a Sunday so he drank 2 of 40 ounce malt liquor then started having severe epigastric pain nausea vomiting In the ED vitals stable internal to 80 UDS positive for opiate, labs showed hyponatremia, lipase 699 albumin 3.3 WBC 2.4 hemoglobin 10.2 plt 110. CT abdomen/pelvis without contrast>>>acute pancreatitis. Markedly thickened and inflamed proximal portion of the duodenum likely secondary to adjacent pancreatitis noted. Mild to moderate severity gallbladder distention without evidence of gallstones or biliary dilatation. Hepatic steatosis seen. Mild to moderate severity diffuse urinary bladder wall thickening also seen.  Patient is admitted for acute pancreatitis, alcohol abuse and alcohol withdrawal to SDU. Due to ongoing agitation and behavioral issues psychiatry was consulted, subsequently PCCM was consulted due to worsening alcohol withdrawal despite CIWA protocol and frequent benzodiazepine doses, phenobarbital taper initiated    Subjective: Seen and examined, Overnight vitals/labs/events reviewed Overnight vitals/labs/events reviewed-has been afebrile, no hypotension He has seen stable renal function mild hyponatremia mild leukopenia Has been meeting low-dose Ativan intermittently, completed phenobarbital taper Reports Massimino bothering him by asking for ptot and shower, and he wanted to leave AMA.   Assessment and Plan: Principal Problem:   Acute alcoholic pancreatitis Active Problems:   Alcohol  use disorder, severe, dependence (HCC)   History of seizures   HIV disease (HCC)   Pancytopenia (HCC)   Severe alcohol use disorder Acute alcohol withdrawal History of withdrawal seizure Transaminitis: Patient with chronic alcohol abuse last drink 10/13 PTA.seen by psychiatry> managed with CIWA, danger for withdrawal but did not improve> and subsequently on phenobarbital taper per PCCM and completed.  Mentation improved, oriented and at baseline. Continue Keppra, seizure precaution fall precaution thiamine folate. Alcohol cessation encouraged. Mobilize with PT OT-refused this morning.  Acute alcohol induced pancreatitis: No necrosis on CT on admission.Manage conservatively, tolerating diet, on iv Dilaudid> wean to oral oxy as able.Diet ADAT.  Has ongoing abdominal pain but chronic component to0.   Hypotension: Needed IV fluid bolus 10/19 9.  BP stable- runs on lower side.  HIV: Noncompliant with meds x1 month, last CD 248 07/18/23.  Continue on home Biktarvy  Episode of fever 10/19 workup initiated including : PCT 0.2 borderline  lactic acid normal no leukocytosis.cxr-Limited exam possible right infrahilar opacity advised PA lateral view once patient is able. Added on flagyl and cefepime. ? If from pancreatitis-but abdomen is soft benign no worsening of pain.blood culture 10/19 ngtd.  Mood disorder Bipolar disorder: Mood stable.Seen by psychiatry  Chronic pancytopenia Multiple bruising due to thrombocytopenia/fall " taking hits": CBC stable improved w/ mild anemia   Hyponatremia Hypokalemia Hypocalcemia Hypomagnesemia: Electrolytes stable including monitor, mild hyponatremia persist encourage oral   Severe malnutrition: Augment diet as able to take p.o. refused PICC line and TPN Nutrition Problem: Severe Malnutrition Etiology: chronic illness Signs/Symptoms: severe fat depletion, severe muscle depletion Interventions: Refer to RD note for recommendations,  TPN  Dyspnea/debility/homelessness: PT OT requested, encourage mobilization  DVT prophylaxis: heparin injection 5,000 Units Start: 08/22/23 2200 Code Status:   Code Status: Full Code Family Communication: plan of care discussed with patient at bedside. Patient status  is: Inpatient because of alcohol withdrawal Level of care: Stepdown   Dispo: The patient is from: Homeless            Anticipated disposition: TBD Objective: Vitals last 24 hrs: Vitals:   08/30/23 0400 08/30/23 0500 08/30/23 0700 08/30/23 0800  BP: 116/66 (!) 109/54 124/85 138/76  Pulse: 65 63 81 67  Resp: 14 (!) 9    Temp: 98.5 F (36.9 C)     TempSrc: Oral     SpO2: 97% 93%  94%  Weight:  66.1 kg    Height:       Weight change: 1.2 kg  Physical Examination: General exam: alert awake, oriented , thin frail cachectic  HEENT:Oral mucosa moist, Ear/Nose WNL grossly Respiratory system: Bilaterally clear BS,no use of accessory muscle Cardiovascular system: S1 & S2 +, No JVD. Gastrointestinal system: Abdomen soft,NT,ND, BS+ Nervous System: Alert, awake, moving all extremities,and following commands. Extremities: LE edema neg,distal peripheral pulses palpable and warm.  Skin: No rashes,no icterus. MSK: Normal muscle bulk,tone, power   Medications reviewed:  Scheduled Meds:  bictegravir-emtricitabine-tenofovir AF  1 tablet Oral Daily   Chlorhexidine Gluconate Cloth  6 each Topical QHS   feeding supplement  1 Container Oral TID BM   folic acid  1 mg Oral Daily   heparin  5,000 Units Subcutaneous Q8H   levETIRAcetam  500 mg Oral BID   multivitamin with minerals  1 tablet Oral QHS   senna-docusate  1 tablet Oral BID   sodium chloride flush  3 mL Intravenous Q12H   thiamine  100 mg Oral Daily   Or   thiamine  100 mg Intravenous Daily   Continuous Infusions:  ceFEPime (MAXIPIME) IV 2 g (08/30/23 0906)   metronidazole Stopped (08/30/23 0133)      Diet Order             Diet Heart Room service  appropriate? Yes; Fluid consistency: Thin  Diet effective now                  Intake/Output Summary (Last 24 hours) at 08/30/2023 1026 Last data filed at 08/30/2023 1024 Gross per 24 hour  Intake 478.35 ml  Output 2145 ml  Net -1666.65 ml   Net IO Since Admission: -1,262.98 mL [08/30/23 1026]  Wt Readings from Last 3 Encounters:  08/30/23 66.1 kg  08/09/23 65 kg  08/08/23 65.5 kg     Unresulted Labs (From admission, onward)     Start     Ordered   08/29/23 0500  CBC  Daily,   R     Question:  Specimen collection method  Answer:  Lab=Lab collect   08/28/23 0831   08/29/23 0500  Comprehensive metabolic panel  Daily,   R     Question:  Specimen collection method  Answer:  Lab=Lab collect   08/28/23 0831   08/22/23 1641  CBC with Differential  (ED Abdominal Pain)  Once,   STAT        08/22/23 1647           Data Reviewed: I have personally reviewed following labs and imaging studies CBC: Recent Labs  Lab 08/27/23 0257 08/28/23 0859 08/28/23 2043 08/29/23 0119 08/30/23 0244  WBC 2.3* 5.4 4.1 5.1 3.6*  NEUTROABS  --   --  2.9  --   --   HGB 10.8* 11.2* 10.9* 10.8* 10.5*  HCT 33.5* 34.6* 32.9* 33.1* 32.2*  MCV 103.4* 105.2* 100.0 103.4* 101.9*  PLT 145* 150 185  164 221   Basic Metabolic Panel: Recent Labs  Lab 08/24/23 0253 08/25/23 0301 08/26/23 0434 08/27/23 0257 08/28/23 0859 08/29/23 0119 08/30/23 0244  NA 128*   < > 132* 131* 129* 131* 132*  K 3.7   < > 3.5 3.6 4.4 4.3 3.8  CL 94*   < > 97* 97* 95* 100 100  CO2 24   < > 26 22 24 24 22   GLUCOSE 73   < > 116* 110* 88 103* 105*  BUN 6   < > <5* <5* 6 <5* 8  CREATININE 0.42*   < > 0.53* 0.49* 0.39* 0.67 0.74  CALCIUM 8.0*   < > 8.5* 8.4* 8.5* 8.7* 8.8*  MG 1.9  --  1.7  --   --   --   --   PHOS 2.8  --  3.4  --   --   --   --    < > = values in this interval not displayed.   GFR: Estimated Creatinine Clearance: 119.3 mL/min (by C-G formula based on SCr of 0.74 mg/dL). Liver Function  Tests: Recent Labs  Lab 08/25/23 0301 08/26/23 0434 08/27/23 0257 08/29/23 0119 08/30/23 0244  AST 36 34 30 57* 22  ALT 27 28 26  49* 35  ALKPHOS 67 72 69 67 57  BILITOT 1.5* 1.4* 1.1 1.6* 1.0  PROT 6.2* 6.7 6.3* 6.2* 6.3*  ALBUMIN 2.9* 3.1* 3.0* 2.7* 2.6*   No results for input(s): "LIPASE", "AMYLASE" in the last 168 hours. CBG: Recent Labs  Lab 08/29/23 0407 08/29/23 1249 08/29/23 1934 08/30/23 0043 08/30/23 0346  GLUCAP 80 159* 100* 112* 94   Recent Labs  Lab 08/28/23 2043 08/29/23 0119  PROCALCITON  --  0.28  LATICACIDVEN 1.1 1.2    Recent Results (from the past 240 hour(s))  MRSA Next Gen by PCR, Nasal     Status: None   Collection Time: 08/23/23  4:08 AM   Specimen: Nasal Mucosa; Nasal Swab  Result Value Ref Range Status   MRSA by PCR Next Gen NOT DETECTED NOT DETECTED Final    Comment: (NOTE) The GeneXpert MRSA Assay (FDA approved for NASAL specimens only), is one component of a comprehensive MRSA colonization surveillance program. It is not intended to diagnose MRSA infection nor to guide or monitor treatment for MRSA infections. Test performance is not FDA approved in patients less than 67 years old. Performed at [facility], [address] [place], [place] [address]   Culture, blood (x 2)     Status: None (Preliminary result)   Collection Time: 08/28/23  8:43 PM   Specimen: BLOOD RIGHT HAND  Result Value Ref Range Status   Specimen Description   Final    BLOOD RIGHT HAND Performed at [facility], [address] [place], [place] [address]    Special Requests   Final    BOTTLES DRAWN AEROBIC ONLY Blood Culture adequate volume Performed at [facility], [address], [place], [place] [address]    Culture   Final    NO GROWTH 2 DAYS Performed at [facility], [address] [place], [place] [address]    Report Status PENDING  Incomplete  Culture, blood (x 2)     Status: None (Preliminary  result)   Collection Time: 08/28/23  8:43 PM   Specimen: BLOOD RIGHT HAND  Result Value Ref Range Status   Specimen Description   Final    BLOOD RIGHT HAND  Performed at [facility], [address] [place], [place] [address]    Special Requests   Final    BOTTLES DRAWN AEROBIC ONLY Blood Culture adequate volume Performed at [facility], [address], [place], [place] [address]    Culture   Final    NO GROWTH 2 DAYS Performed at [facility], [address], [place], [place] [address]    Report Status PENDING  Incomplete    Antimicrobials: Anti-infectives (From admission, onward)    Start     Dose/Rate Route Frequency Ordered Stop   08/29/23 0015  metroNIDAZOLE (FLAGYL) IVPB 500 mg        500 mg 100 mL/hr over 60 Minutes Intravenous Every 12 hours 08/28/23 2320 09/05/23 0014   08/29/23 0015  ceFEPIme (MAXIPIME) 2 g in sodium chloride 0.9 % 100 mL IVPB        2 g 200 mL/hr over 30 Minutes Intravenous Every 8 hours 08/28/23 2325     08/23/23 0800  bictegravir-emtricitabine-tenofovir AF (BIKTARVY) 50-200-25 MG per tablet 1 tablet        1 tablet Oral Daily 08/22/23 2153        Culture/Microbiology    Component Value Date/Time   SDES  08/28/2023 2043    BLOOD RIGHT HAND Performed at [facility], [address] [place], [place] [address]    SDES  08/28/2023 2043    BLOOD RIGHT HAND Performed at [facility], [address] [place], [place] [address]    SPECREQUEST  08/28/2023 2043    BOTTLES DRAWN AEROBIC ONLY Blood Culture adequate volume Performed at [facility], [address], [place], [place] [address]    SPECREQUEST  08/28/2023 2043    BOTTLES DRAWN AEROBIC ONLY Blood Culture adequate volume Performed at [facility], [address], [place], [place] [address]    CULT  08/28/2023 2043    NO GROWTH 2 DAYS Performed at [facility], [address], [place], [place]  [address]    CULT  08/28/2023 2043    NO GROWTH 2 DAYS Performed at [facility], [address], [place], [place] [address]    REPTSTATUS PENDING 08/28/2023 2043   REPTSTATUS PENDING 08/28/2023 2043   Radiology Studies: [facility] Chest Port 1 View  Result Date: 08/28/2023 CLINICAL DATA:  Sepsis. EXAM: PORTABLE CHEST 1 VIEW COMPARISON:  Chest radiograph 05/26/2023. Included lung bases from abdominopelvic CT 08/22/2023 FINDINGS: Evaluation is technically limited due to significant patient rotation and low lung volumes. The heart size and mediastinal contours are obscured. Possible right infrahilar opacity. No pneumothorax. More detailed assessment is limited. IMPRESSION: Technically limited exam due to significant patient rotation and low lung volumes. Possible right infrahilar opacity. Recommend PA and lateral views when patient is able. Electronically Signed   By: Narda Rutherford M.D.   On: 08/28/2023 22:58     LOS: 8 days   Lanae Boast, MD [facility]  08/30/2023, 10:26 AM

## 2023-08-30 NOTE — Progress Notes (Signed)
Patient is agitated and is refusing vital signs monitoring and physical assessments. Patient currently threatening to leave AMA. MD made aware.

## 2023-08-30 NOTE — Progress Notes (Signed)
Pt has refused bath on numerous attempts throughout the night by this RN and nighttime nurse tech.

## 2023-08-30 NOTE — Progress Notes (Signed)
PT Cancellation Note  Patient Details Name: Gary [name] MRN: [id number] DOB: 1986-02-11   Cancelled Treatment:    Reason Eval/Treat Not Completed: Patient declined, no reason specified (pt c/o nausea and stomach aches. declining to mobilize OOB at this time. pt making threatening statements regarding staff though not specifiaclly this therapist. RN notified of need for nausea meds. Will follow up at later date/time as schedule allows.)    Renaldo Fiddler PT, DPT Acute Rehabilitation Services Office [phone]  08/30/23 9:04 AM

## 2023-08-30 NOTE — TOC Progression Note (Addendum)
Transition of Care [facility]) - Progression Note    Patient Details  Name: Gary [name] MRN: [id number] Date of Birth: 11-22-1985  Transition of Care [facility]) CM/SW Contact  Lavenia Atlas, RN Phone Number: 08/30/2023, 5:55 PM  Clinical Narrative:   Patient has medical insurance and does not qualify for [facility] program. Per chart review patient has $0 copay for Biktarvy. This RNCM will attach SA, social services, and PCP resources to AVS.    Transportation at discharge: public/bus pass   No TOC needs at this time. If additional needs arise place [facility] consult.         Expected Discharge Plan and Services                                               Social Determinants of Health (SDOH) Interventions SDOH Screenings   Food Insecurity: Food Insecurity Present (08/23/2023)  Housing: Medium Risk (08/23/2023)  Transportation Needs: Unmet Transportation Needs (08/23/2023)  Utilities: Patient Declined (08/23/2023)  Alcohol Screen: High Risk (07/25/2021)  Depression (PHQ2-9): Medium Risk (02/12/2021)  Financial Resource Strain: High Risk (06/30/2022)   Received from [facility]  Social Connections: Unknown (06/24/2022)   Received from [facility]  Stress: No Stress Concern Present (06/30/2022)   Received from [facility]  Recent Concern: Stress - Stress Concern Present (06/25/2022)   Received from [facility]  Tobacco Use: High Risk (08/24/2023)    Readmission Risk Interventions    08/23/2023    6:11 PM 06/15/2023   10:18 AM 05/25/2023    1:34 PM  Readmission Risk Prevention Plan  Transportation Screening Complete Complete Complete  Medication Review (RN Care Manager) Complete Complete Complete  PCP or Specialist appointment within 3-5 days of discharge Complete  Complete  HRI or Home Care Consult Complete Complete Complete  SW Recovery Care/Counseling Consult Complete Complete Complete  Palliative Care Screening Not  Applicable Not Applicable Not Applicable  Skilled Nursing Facility Not Applicable Not Applicable Not Applicable

## 2023-08-30 NOTE — Plan of Care (Signed)
  Problem: Education: Goal: Knowledge of General Education information will improve Description: Including pain rating scale, medication(s)/side effects and non-pharmacologic comfort measures Outcome: Progressing   Problem: Health Behavior/Discharge Planning: Goal: Ability to manage health-related needs will improve Outcome: Progressing   Problem: Clinical Measurements: Goal: Diagnostic test results will improve Outcome: Progressing Goal: Respiratory complications will improve Outcome: Progressing Goal: Cardiovascular complication will be avoided Outcome: Progressing   Problem: Activity: Goal: Risk for activity intolerance will decrease Outcome: Progressing   Problem: Nutrition: Goal: Adequate nutrition will be maintained Outcome: Progressing   Problem: Elimination: Goal: Will not experience complications related to urinary retention Outcome: Progressing   Problem: Skin Integrity: Goal: Risk for impaired skin integrity will decrease Outcome: Progressing   Problem: Education: Goal: Ability to describe self-care measures that may prevent or decrease complications (Diabetes Survival Skills Education) will improve Outcome: Progressing Goal: Individualized Educational Video(s) Outcome: Progressing   Problem: Coping: Goal: Ability to adjust to condition or change in health will improve Outcome: Progressing   Problem: Health Behavior/Discharge Planning: Goal: Ability to identify and utilize available resources and services will improve Outcome: Progressing Goal: Ability to manage health-related needs will improve Outcome: Progressing   Problem: Metabolic: Goal: Ability to maintain appropriate glucose levels will improve Outcome: Progressing   Problem: Nutritional: Goal: Maintenance of adequate nutrition will improve Outcome: Progressing Goal: Progress toward achieving an optimal weight will improve Outcome: Progressing   Problem: Skin Integrity: Goal: Risk for  impaired skin integrity will decrease Outcome: Progressing   Problem: Tissue Perfusion: Goal: Adequacy of tissue perfusion will improve Outcome: Progressing   Problem: Safety: Goal: Violent Restraint(s) Outcome: Progressing   Problem: Safety: Goal: Non-violent Restraint(s) Outcome: Progressing   Problem: Clinical Measurements: Goal: Ability to maintain clinical measurements within normal limits will improve Outcome: Not Progressing Goal: Will remain free from infection Outcome: Not Progressing   Problem: Coping: Goal: Level of anxiety will decrease Outcome: Not Progressing   Problem: Elimination: Goal: Will not experience complications related to bowel motility Outcome: Not Progressing   Problem: Pain Managment: Goal: General experience of comfort will improve Outcome: Not Progressing   Problem: Safety: Goal: Ability to remain free from injury will improve Outcome: Not Progressing   Problem: Fluid Volume: Goal: Ability to maintain a balanced intake and output will improve Outcome: Not Progressing

## 2023-08-31 ENCOUNTER — Inpatient Hospital Stay (HOSPITAL_COMMUNITY): Payer: MEDICAID

## 2023-08-31 DIAGNOSIS — K852 Alcohol induced acute pancreatitis without necrosis or infection: Secondary | ICD-10-CM | POA: Diagnosis not present

## 2023-08-31 LAB — COMPREHENSIVE METABOLIC PANEL
ALT: 28 U/L (ref 0–44)
AST: 18 U/L (ref 15–41)
Albumin: 2.6 g/dL — ABNORMAL LOW (ref 3.5–5.0)
Alkaline Phosphatase: 65 U/L (ref 38–126)
Anion gap: 9 (ref 5–15)
BUN: 11 mg/dL (ref 6–20)
CO2: 26 mmol/L (ref 22–32)
Calcium: 8.7 mg/dL — ABNORMAL LOW (ref 8.9–10.3)
Chloride: 99 mmol/L (ref 98–111)
Creatinine, Ser: 0.62 mg/dL (ref 0.61–1.24)
GFR, Estimated: >60 mL/min (ref >60)
Glucose, Bld: 136 mg/dL — ABNORMAL HIGH (ref 70–99)
Potassium: 3.6 mmol/L (ref 3.5–5.1)
Sodium: 134 mmol/L — ABNORMAL LOW (ref 135–145)
Total Bilirubin: 0.7 mg/dL (ref 0.3–1.2)
Total Protein: 6.3 g/dL — ABNORMAL LOW (ref 6.5–8.1)

## 2023-08-31 LAB — CBC
HCT: 31.8 % — ABNORMAL LOW (ref 39.0–52.0)
Hemoglobin: 10.2 g/dL — ABNORMAL LOW (ref 13.0–17.0)
MCH: 32.8 pg (ref 26.0–34.0)
MCHC: 32.1 g/dL (ref 30.0–36.0)
MCV: 102.3 fL — ABNORMAL HIGH (ref 80.0–100.0)
Platelets: 272 10*3/uL (ref 150–400)
RBC: 3.11 MIL/uL — ABNORMAL LOW (ref 4.22–5.81)
RDW: 15 % (ref 11.5–15.5)
WBC: 3.8 10*3/uL — ABNORMAL LOW (ref 4.0–10.5)
nRBC: 0 % (ref 0.0–0.2)

## 2023-08-31 LAB — GLUCOSE, CAPILLARY
Glucose-Capillary: 133 mg/dL — ABNORMAL HIGH (ref 70–99)
Glucose-Capillary: 102 mg/dL — ABNORMAL HIGH (ref 70–99)
Glucose-Capillary: 147 mg/dL — ABNORMAL HIGH (ref 70–99)
Glucose-Capillary: 123 mg/dL — ABNORMAL HIGH (ref 70–99)

## 2023-08-31 MED ORDER — POLYETHYLENE GLYCOL 3350 17 G PO PACK
17.0000 g | PACK | Freq: Every day | ORAL | Status: DC
  Administered 2023-08-31 – 2023-09-01 (×2): 17 g via ORAL
  Filled 2023-08-31 (×2): qty 1

## 2023-08-31 MED ORDER — BISACODYL 10 MG RE SUPP: 10.0000 mg | Freq: Every day | RECTAL | Status: DC | PRN

## 2023-08-31 NOTE — Progress Notes (Signed)
PT Cancellation Note  Patient Details Name: Gary [name] MRN: [id number] DOB: 11-14-1985   Cancelled Treatment:    Reason Eval/Treat Not Completed: Patient declined, no reason specified;Patient's level of consciousness at ~ 900. Will check back another time. [name] PT Acute Rehabilitation Services Office [phone] Weekend [phone]   [name] 08/31/2023, 4:23 PM

## 2023-08-31 NOTE — Progress Notes (Signed)
OT Cancellation Note  Patient Details Name: Gary [name] MRN: [id number] DOB: 08/18/1986   Cancelled Treatment:    Reason Eval/Treat Not Completed: Fatigue/lethargy limiting ability to participate OT to continue to follow and check back as schedule will allow.  Rosalio Loud, MS Acute Rehabilitation Department Office# [phone]  08/31/2023, 9:53 AM

## 2023-08-31 NOTE — Plan of Care (Signed)
  Problem: Clinical Measurements: Goal: Ability to maintain clinical measurements within normal limits will improve Outcome: Progressing Goal: Diagnostic test results will improve Outcome: Progressing   Problem: Activity: Goal: Risk for activity intolerance will decrease Outcome: Progressing   Problem: Nutrition: Goal: Adequate nutrition will be maintained Outcome: Progressing   Problem: Pain Managment: Goal: General experience of comfort will improve Outcome: Progressing   Problem: Fluid Volume: Goal: Ability to maintain a balanced intake and output will improve Outcome: Progressing   Problem: Nutritional: Goal: Maintenance of adequate nutrition will improve Outcome: Progressing

## 2023-08-31 NOTE — Progress Notes (Signed)
Pharmacy Antibiotic Note  Gary [name] is a 37 y.o. male admitted on 08/22/2023 with  FUO .  Pharmacy has been consulted for Cefepime dosing.  ID: FUO 10/19, PCT 0.28; WBC WNL  UA neg, 10/19 CXR: possible opacity - Afebrile, WBC 3.8, Scr <1  Biktarvy  (noncompliant PTA) >> 10/20 Cefepime>  10/20 Flagyl >   10/14 MRSA - 10/19 BCx2: ngtd  Plan: Cefepime 2g IV q8hr days #4 Pharmacy will sign off. Please reconsult for further dosing assitance.     Height: 6' (182.9 cm) Weight: 57.8 kg (127 lb 6.8 oz) IBW/kg (Calculated) : 77.6  Temp (24hrs), Avg:98.3 F (36.8 C), Min:97.4 F (36.3 C), Max:98.9 F (37.2 C)  Recent Labs  Lab 08/27/23 0257 08/28/23 0859 08/28/23 2043 08/29/23 0119 08/30/23 0244 08/31/23 0257  WBC 2.3* 5.4 4.1 5.1 3.6* 3.8*  CREATININE 0.49* 0.39*  --  0.67 0.74 0.62  LATICACIDVEN  --   --  1.1 1.2  --   --     Estimated Creatinine Clearance: 104.4 mL/min (by C-G formula based on SCr of 0.62 mg/dL).    Allergies  Allergen Reactions   Tegretol [Carbamazepine] Other (See Comments)    Caused vertigo for 2 days after taking it   Caffeine Palpitations    Tayton Decaire S. Merilynn Finland, PharmD, BCPS Clinical Staff Pharmacist [email or web address]  Pasty Spillers 08/31/2023 9:05 AM

## 2023-08-31 NOTE — Progress Notes (Signed)
Nutrition Follow-up  DOCUMENTATION CODES:   Severe malnutrition in context of chronic illness  INTERVENTION:  - Heart Healthy diet per MD.  - Boost Breeze po TID, each supplement provides 250 kcal and 9 grams of protein - Encourage intake at all meals and of supplements.  - Continue daily Multivitamin with minerals daily, folic acid, thiamine for alcohol abuse. - Monitor weight trends.   NUTRITION DIAGNOSIS:   Severe Malnutrition related to chronic illness as evidenced by severe fat depletion, severe muscle depletion. *ongoing  GOAL:   Patient will meet greater than or equal to 90% of their needs *progressing  MONITOR:   Diet advancement, Labs, Weight trends  REASON FOR ASSESSMENT:   Consult New TPN/TNA  ASSESSMENT:   37 y.o. male with PMH of HIV disease, seizure disorder, EtOH abuse, recurrent alcohol-associated pancreatitis, bipolar disorder, homelessness who presented to the ED on 08/22/2023 with nausea, vomiting and abdominal pain. Admitted for acute recurrent alcohol associated pancreatitis.  10/13: Admit; NPO 10/16: plan to place PICC and start TPN but pt redused PICC placement; remains NPO 10/18: CL diet 10/19: FL diet 10/20: Heart diet  TPN never started as patient refused PICC. Was able to advance to a clear liquid diet on 10/18 and has continued to tolerate diet fairly well.   On as heart healthy diet as on 10/22. Unsure how patient is eating as nothing documented. Has been receiving automatic house trays. Per RN, patient has been eating 100% of meals. He accepted x3 Boost Breeze yesterday.   Admit weight: 145# Current weight: 127# I&O's: -1.6L Most recent weight from today of 127# indicates possible weight loss since admit. However, this weight is an outlier as patient was just weighed at 145# yesterday and has been weighing between 143-150# since admit. Will continue to monitor trends closely.   Medications reviewed and include: 1mg  folic acid, MVI,  100mg  thiamine  Labs reviewed:  Na 134   Diet Order:   Diet Order             Diet Heart Room service appropriate? Yes; Fluid consistency: Thin  Diet effective now                   EDUCATION NEEDS:  Education needs have been addressed  Skin:  Skin Assessment: Reviewed RN Assessment  Last BM:  10/20  Height:  Ht Readings from Last 1 Encounters:  08/22/23 6' (1.829 m)   Weight: Wt Readings from Last 1 Encounters:  08/31/23 57.8 kg    BMI:  Body mass index is 17.28 kg/m.  Estimated Nutritional Needs:  Kcal:  2000-2200 kcals Protein:  100-120 grams Fluid:  >/= 2L    Shelle Iron RD, LDN For contact information, refer to [facility].

## 2023-08-31 NOTE — Progress Notes (Signed)
PROGRESS NOTE ZECHARIAH STINSON  ZOX:[id number] DOB: 10/28/1986 DOA: 08/22/2023 PCP: Patient, No Pcp Per  Brief Narrative/Hospital Course: 37 y.o. male with medical history significant for alcohol use disorder with hx of withdrawal seizures, polysubstance use, recurrent alcohol associated pancreatitis, HIV, PTSD, anxiety, bipolar disorder, homelessness who presented to ED due to 9/13 with nausea vomiting abdominal pain.  He drinks about 2/5 of liquor daily and could not get any Lasix since being a Sunday so he drank 2 of 40 ounce malt liquor then started having severe epigastric pain nausea vomiting In the ED vitals stable internal to 80 UDS positive for opiate, labs showed hyponatremia, lipase 699 albumin 3.3 WBC 2.4 hemoglobin 10.2 plt 110. CT abdomen/pelvis without contrast>>>acute pancreatitis. Markedly thickened and inflamed proximal portion of the duodenum likely secondary to adjacent pancreatitis noted. Mild to moderate severity gallbladder distention without evidence of gallstones or biliary dilatation. Hepatic steatosis seen. Mild to moderate severity diffuse urinary bladder wall thickening also seen.  Patient is admitted for acute pancreatitis, alcohol abuse and alcohol withdrawal to SDU. Due to ongoing agitation and behavioral issues psychiatry was consulted, subsequently PCCM was consulted due to worsening alcohol withdrawal despite CIWA protocol and frequent benzodiazepine doses, phenobarbital taper initiated    Subjective: Patient seen and examined Tolerating diet, complains of chronic abdominal pain no nausea vomiting  Afebrile overnight.  Labs stable.  Continue to work with PT OT   Assessment and Plan: Principal Problem:   Acute alcoholic pancreatitis Active Problems:   Alcohol use disorder, severe, dependence (HCC)   History of seizures   HIV disease (HCC)   Pancytopenia (HCC)   Severe alcohol use disorder Acute alcohol withdrawal History of withdrawal  seizure Transaminitis: Patient with chronic alcohol abuse last drink 10/13 PTA.seen by psychiatry> managed with CIWA, danger for withdrawal but did not improve> and completed phenobarbital taper.  Oriented x 3 out of delirium.  Continue Keppra, seizure precaution fall precaution thiamine folate. Alcohol cessation encouraged. Mobilize with PT OT with plan for disposition.  Acute alcohol induced pancreatitis: No necrosis on CT on admission.Managed conservatively, tolerating diet, on iv Dilaudid> wean to oral oxy as able.continue diet, has chronic abdominal pain.    Hypotension: Needed IV fluid bolus 10/19. BP is stable currently.    HIV: Noncompliant with meds x1 month, last CD 248 07/18/23.  Continue on home Biktarvy  Episode of fever 10/19 workup initiated including : PCT 0.2 borderline  lactic acid normal no leukocytosis.cxr-Limited exam possible right infrahilar opacity advised PA lateral view once patient is able. Added on flagyl and cefepime. ? If from pancreatitis-but abdomen is soft benign no worsening of pain.blood culture 10/19 ngtd.  Mood disorder Bipolar disorder: Mood stable.Seen by psychiatry  Chronic pancytopenia Multiple bruising due to thrombocytopenia/fall " taking hits": CBC stable improved w/ mild anemia   Hyponatremia Hypokalemia Hypocalcemia Hypomagnesemia: Electrolytes stable including monitor, mild hyponatremia persist encourage oral   Severe malnutrition: Augment diet as able to take p.o. refused PICC line and TPN Nutrition Problem: Severe Malnutrition Etiology: chronic illness Signs/Symptoms: severe fat depletion, severe muscle depletion Interventions: Refer to RD note for recommendations, TPN  Dyspnea/debility/homelessness: PT OT requested, encourage mobilization  DVT prophylaxis: heparin injection 5,000 Units Start: 08/22/23 2200 Code Status:   Code Status: Full Code Family Communication: plan of care discussed with patient at bedside. Patient  status is: Inpatient because of alcohol withdrawal Level of care: Stepdown   Dispo: The patient is from: Homeless  Anticipated disposition: Pending PT OT evaluation for disposition.    Objective: Vitals last 24 hrs: Vitals:   08/30/23 1600 08/30/23 1900 08/31/23 0500 08/31/23 0558  BP:    111/67  Pulse:    64  Resp:    18  Temp: 98.8 F (37.1 C) 98.9 F (37.2 C)  (!) 97.4 F (36.3 C)  TempSrc: Oral Oral  Oral  SpO2:    97%  Weight:   57.8 kg   Height:       Weight change: -8.3 kg  Physical Examination: General exam: alert awake HEENT:Oral mucosa moist, Ear/Nose WNL grossly Respiratory system: Bilaterally clear BS,no use of accessory muscle Cardiovascular system: S1 & S2 +, No JVD. Gastrointestinal system: Abdomen soft,NT,ND, BS+ Nervous System: Alert, awake, moving all extremities,and following commands. Extremities: LE edema neg,distal peripheral pulses palpable and warm.  Skin: No rashes,no icterus. MSK: Normal muscle bulk,tone, power   Medications reviewed:  Scheduled Meds:  bictegravir-emtricitabine-tenofovir AF  1 tablet Oral Daily   Chlorhexidine Gluconate Cloth  6 each Topical QHS   feeding supplement  1 Container Oral TID BM   folic acid  1 mg Oral Daily   heparin  5,000 Units Subcutaneous Q8H   levETIRAcetam  500 mg Oral BID   multivitamin with minerals  1 tablet Oral QHS   senna-docusate  1 tablet Oral BID   sodium chloride flush  3 mL Intravenous Q12H   thiamine  100 mg Oral Daily   Or   thiamine  100 mg Intravenous Daily   Continuous Infusions:  ceFEPime (MAXIPIME) IV 2 g (08/31/23 0846)   metronidazole Stopped (08/31/23 0210)      Diet Order             Diet Heart Room service appropriate? Yes; Fluid consistency: Thin  Diet effective now                  Intake/Output Summary (Last 24 hours) at 08/31/2023 1012 Last data filed at 08/31/2023 0846 Gross per 24 hour  Intake 503 ml  Output 650 ml  Net -147 ml   Net IO Since  Admission: -1,412.98 mL [08/31/23 1012]  Wt Readings from Last 3 Encounters:  08/31/23 57.8 kg  08/09/23 65 kg  08/08/23 65.5 kg     Unresulted Labs (From admission, onward)     Start     Ordered   08/29/23 0500  CBC  Daily,   R     Question:  Specimen collection method  Answer:  Lab=Lab collect   08/28/23 0831   08/29/23 0500  Comprehensive metabolic panel  Daily,   R     Question:  Specimen collection method  Answer:  Lab=Lab collect   08/28/23 0831   08/22/23 1641  CBC with Differential  (ED Abdominal Pain)  Once,   STAT        08/22/23 1647           Data Reviewed: I have personally reviewed following labs and imaging studies CBC: Recent Labs  Lab 08/28/23 0859 08/28/23 2043 08/29/23 0119 08/30/23 0244 08/31/23 0257  WBC 5.4 4.1 5.1 3.6* 3.8*  NEUTROABS  --  2.9  --   --   --   HGB 11.2* 10.9* 10.8* 10.5* 10.2*  HCT 34.6* 32.9* 33.1* 32.2* 31.8*  MCV 105.2* 100.0 103.4* 101.9* 102.3*  PLT 150 185 164 221 272   Basic Metabolic Panel: Recent Labs  Lab 08/26/23 0434 08/27/23 0257 08/28/23 0859 08/29/23 0119 08/30/23 0244 08/31/23  0257  NA 132* 131* 129* 131* 132* 134*  K 3.5 3.6 4.4 4.3 3.8 3.6  CL 97* 97* 95* 100 100 99  CO2 26 22 24 24 22 26   GLUCOSE 116* 110* 88 103* 105* 136*  BUN <5* <5* 6 <5* 8 11  CREATININE 0.53* 0.49* 0.39* 0.67 0.74 0.62  CALCIUM 8.5* 8.4* 8.5* 8.7* 8.8* 8.7*  MG 1.7  --   --   --   --   --   PHOS 3.4  --   --   --   --   --    GFR: Estimated Creatinine Clearance: 104.4 mL/min (by C-G formula based on SCr of 0.62 mg/dL). Liver Function Tests: Recent Labs  Lab 08/26/23 0434 08/27/23 0257 08/29/23 0119 08/30/23 0244 08/31/23 0257  AST 34 30 57* 22 18  ALT 28 26 49* 35 28  ALKPHOS 72 69 67 57 65  BILITOT 1.4* 1.1 1.6* 1.0 0.7  PROT 6.7 6.3* 6.2* 6.3* 6.3*  ALBUMIN 3.1* 3.0* 2.7* 2.6* 2.6*   No results for input(s): "LIPASE", "AMYLASE" in the last 168 hours. CBG: Recent Labs  Lab 08/30/23 0346 08/30/23 1110  08/30/23 1841 08/31/23 0333 08/31/23 0808  GLUCAP 94 92 134* 133* 102*   Recent Labs  Lab 08/28/23 2043 08/29/23 0119  PROCALCITON  --  0.28  LATICACIDVEN 1.1 1.2    Recent Results (from the past 240 hour(s))  MRSA Next Gen by PCR, Nasal     Status: None   Collection Time: 08/23/23  4:08 AM   Specimen: Nasal Mucosa; Nasal Swab  Result Value Ref Range Status   MRSA by PCR Next Gen NOT DETECTED NOT DETECTED Final    Comment: (NOTE) The GeneXpert MRSA Assay (FDA approved for NASAL specimens only), is one component of a comprehensive MRSA colonization surveillance program. It is not intended to diagnose MRSA infection nor to guide or monitor treatment for MRSA infections. Test performance is not FDA approved in patients less than 87 years old. Performed at [facility], [address], [place], [place] [address]   Culture, blood (x 2)     Status: None (Preliminary result)   Collection Time: 08/28/23  8:43 PM   Specimen: BLOOD RIGHT HAND  Result Value Ref Range Status   Specimen Description   Final    BLOOD RIGHT HAND Performed at [facility], [address] [place], [place] [address]    Special Requests   Final    BOTTLES DRAWN AEROBIC ONLY Blood Culture adequate volume Performed at [facility], [address], [place], [place] [address]    Culture   Final    NO GROWTH 3 DAYS Performed at [facility], [address], [place], [place] [address]    Report Status PENDING  Incomplete  Culture, blood (x 2)     Status: None (Preliminary result)   Collection Time: 08/28/23  8:43 PM   Specimen: BLOOD RIGHT HAND  Result Value Ref Range Status   Specimen Description   Final    BLOOD RIGHT HAND Performed at [facility], [address], [place], [place] [address]    Special Requests   Final    BOTTLES DRAWN AEROBIC ONLY Blood Culture adequate volume Performed at [facility], [address],  [place], [place] [address]    Culture   Final    NO GROWTH 3 DAYS Performed at [facility], [address],  [place], [place] [address]    Report Status PENDING  Incomplete    Antimicrobials: Anti-infectives (From admission, onward)    Start     Dose/Rate Route Frequency Ordered Stop   08/29/23 0015  metroNIDAZOLE (FLAGYL) IVPB 500 mg        500 mg 100 mL/hr over 60 Minutes Intravenous Every 12 hours 08/28/23 2320 09/05/23 0014   08/29/23 0015  ceFEPIme (MAXIPIME) 2 g in sodium chloride 0.9 % 100 mL IVPB        2 g 200 mL/hr over 30 Minutes Intravenous Every 8 hours 08/28/23 2325     08/23/23 0800  bictegravir-emtricitabine-tenofovir AF (BIKTARVY) 50-200-25 MG per tablet 1 tablet        1 tablet Oral Daily 08/22/23 2153        Culture/Microbiology    Component Value Date/Time   SDES  08/28/2023 2043    BLOOD RIGHT HAND Performed at [facility], [address], [place], [place] [address]    SDES  08/28/2023 2043    BLOOD RIGHT HAND Performed at [facility], [address] [place], [place] [address]    SPECREQUEST  08/28/2023 2043    BOTTLES DRAWN AEROBIC ONLY Blood Culture adequate volume Performed at [facility], [address], [place], [place] [address]    SPECREQUEST  08/28/2023 2043    BOTTLES DRAWN AEROBIC ONLY Blood Culture adequate volume Performed at [facility], [address], [place], [place] [address]    CULT  08/28/2023 2043    NO GROWTH 3 DAYS Performed at [facility], [address], [place], [place] [address]    CULT  08/28/2023 2043    NO GROWTH 3 DAYS Performed at [facility], [address], [place], [place] [address]    REPTSTATUS PENDING 08/28/2023 2043   REPTSTATUS PENDING 08/28/2023 2043   Radiology Studies: No results found.   LOS: 9 days   Lanae Boast, MD [facility]  08/31/2023, 10:12 AM

## 2023-09-01 ENCOUNTER — Other Ambulatory Visit (HOSPITAL_COMMUNITY): Payer: Self-pay

## 2023-09-01 DIAGNOSIS — K852 Alcohol induced acute pancreatitis without necrosis or infection: Secondary | ICD-10-CM | POA: Diagnosis not present

## 2023-09-01 LAB — COMPREHENSIVE METABOLIC PANEL
ALT: 25 U/L (ref 0–44)
AST: 19 U/L (ref 15–41)
Albumin: 2.7 g/dL — ABNORMAL LOW (ref 3.5–5.0)
Alkaline Phosphatase: 56 U/L (ref 38–126)
Anion gap: 5 (ref 5–15)
BUN: 8 mg/dL (ref 6–20)
CO2: 29 mmol/L (ref 22–32)
Calcium: 8.9 mg/dL (ref 8.9–10.3)
Chloride: 100 mmol/L (ref 98–111)
Creatinine, Ser: 0.44 mg/dL — ABNORMAL LOW (ref 0.61–1.24)
GFR, Estimated: >60 mL/min (ref >60)
Glucose, Bld: 109 mg/dL — ABNORMAL HIGH (ref 70–99)
Potassium: 4 mmol/L (ref 3.5–5.1)
Sodium: 134 mmol/L — ABNORMAL LOW (ref 135–145)
Total Bilirubin: 0.6 mg/dL (ref 0.3–1.2)
Total Protein: 6.4 g/dL — ABNORMAL LOW (ref 6.5–8.1)

## 2023-09-01 LAB — CBC
HCT: 34.5 % — ABNORMAL LOW (ref 39.0–52.0)
Hemoglobin: 11 g/dL — ABNORMAL LOW (ref 13.0–17.0)
MCH: 32.7 pg (ref 26.0–34.0)
MCHC: 31.9 g/dL (ref 30.0–36.0)
MCV: 102.7 fL — ABNORMAL HIGH (ref 80.0–100.0)
Platelets: 307 10*3/uL (ref 150–400)
RBC: 3.36 MIL/uL — ABNORMAL LOW (ref 4.22–5.81)
RDW: 15.2 % (ref 11.5–15.5)
WBC: 4 10*3/uL (ref 4.0–10.5)
nRBC: 0 % (ref 0.0–0.2)

## 2023-09-01 LAB — GLUCOSE, CAPILLARY: Glucose-Capillary: 97 mg/dL (ref 70–99)

## 2023-09-01 MED ORDER — FOLIC ACID 1 MG PO TABS
1.0000 mg | ORAL_TABLET | Freq: Every day | ORAL | 0 refills | Status: DC
  Filled 2023-09-01: qty 30, 30d supply, fill #0

## 2023-09-01 MED ORDER — VITAMIN B-1 100 MG PO TABS
100.0000 mg | ORAL_TABLET | Freq: Every day | ORAL | 0 refills | Status: DC
  Filled 2023-09-01: qty 30, 30d supply, fill #0

## 2023-09-01 NOTE — Discharge Summary (Signed)
Physician Discharge Summary  Gary [name] NWG:[id number] DOB: 08-Jul-1986 DOA: 08/22/2023  PCP: Patient, No Pcp Per  Admit date: 08/22/2023 Discharge date: 09/01/2023 Recommendations for Outpatient Follow-up:  Follow up with PCP in 1 weeks-call for appointment Please obtain BMP/CBC in one week  Discharge Dispo: Shelter Discharge Condition: Stable Code Status:   Code Status: Full Code Diet recommendation:  Diet Order             Diet Heart Room service appropriate? Yes; Fluid consistency: Thin  Diet effective now                    Brief/Interim Summary: 37 y.o. male with medical history significant for alcohol use disorder with hx of withdrawal seizures, polysubstance use, recurrent alcohol associated pancreatitis, HIV, PTSD, anxiety, bipolar disorder, homelessness who presented to ED due to 9/13 with nausea vomiting abdominal pain.  He drinks about 2/5 of liquor daily and could not get any Lasix since being a Sunday so he drank 2 of 40 ounce malt liquor then started having severe epigastric pain nausea vomiting In the ED vitals stable internal to 80 UDS positive for opiate, labs showed hyponatremia, lipase 699 albumin 3.3 WBC 2.4 hemoglobin 10.2 plt 110. CT abdomen/pelvis without contrast>>>acute pancreatitis. Markedly thickened and inflamed proximal portion of the duodenum likely secondary to adjacent pancreatitis noted. Mild to moderate severity gallbladder distention without evidence of gallstones or biliary dilatation. Hepatic steatosis seen. Mild to moderate severity diffuse urinary bladder wall thickening also seen.  Patient is admitted for acute pancreatitis, alcohol abuse and alcohol withdrawal to SDU. Due to ongoing agitation and behavioral issues psychiatry was consulted, subsequently PCCM was consulted due to worsening alcohol withdrawal despite CIWA protocol and frequent benzodiazepine doses, phenobarbital taper initiated.  Patient has completed taper, at this time  mentation has improved he is diet was slowly advanced and tolerated diet well no nausea vomiting had some chronic abdominal pain, at this time he is mobilizing well worked with PT OT and did well.  He is stable for discharge.  TOC has seen the patient to help with homeless shelter and hospitalization.  Patient is requesting for discharge    Discharge Diagnoses:  Principal Problem:   Acute alcoholic pancreatitis Active Problems:   Alcohol use disorder, severe, dependence (HCC)   History of seizures   HIV disease (HCC)   Pancytopenia (HCC)  Severe alcohol use disorder Acute alcohol withdrawal History of withdrawal seizure Transaminitis: Patient with chronic alcohol abuse last drink 10/13 PTA.seen by psychiatry> managed with CIWA, danger for withdrawal but did not improve> and completed phenobarbital taper.  Oriented x 3 out of delirium. Continue Keppra, seizure precaution fall precaution thiamine folate.  We discussed alcohol cessation    Acute alcohol induced pancreatitis: No necrosis on CT on admission.Managed conservatively, tolerating diet, discontinued IV pain management, at this time no new issues tolerating diet and stable for discharge   Hypotension: Needed IV fluid bolus 10/19. BP is stable currently.     HIV: Noncompliant with meds x1 month, last CD 248 07/18/23.  Continue on home Biktarvy   Episode of fever 10/19 workup initiated including : PCT 0.2 borderline  lactic acid normal no leukocytosis.cxr-Limited exam possible right infrahilar opacity advised PA lateral view once patient is able. Added on flagyl and cefepime. ? If from pancreatitis-but abdomen is soft benign no worsening of pain.blood culture 10/19 ngtd.  Antibiotic discontinued   Mood disorder Bipolar disorder: Mood stable.Seen by psychiatry   Chronic pancytopenia Multiple bruising due  to thrombocytopenia/fall " taking hits": CBC stable improved w/ mild anemia    Hyponatremia Hypokalemia Hypocalcemia Hypomagnesemia: Electrolytes normalized   Severe malnutrition: Augment diet as able to take p.o. refused PICC line and TPN Nutrition Problem: Severe Malnutrition Etiology: chronic illness Signs/Symptoms: severe fat depletion, severe muscle depletion Interventions: Refer to RD note for recommendations, TPN   Dyspnea/debility/homelessness: PT OT requested he did well with ambulation and is stable for discharge  Consults: PCCM Subjective: Alert awake oriented x 3, eating diet, looking forward to going home today  Discharge Exam: Vitals:   09/01/23 0414 09/01/23 0800  BP: 121/75   Pulse: 77   Resp: 18   Temp: 98.1 F (36.7 C) 98.5 F (36.9 C)  SpO2: 97%    General: Pt is alert, awake, not in acute distress Cardiovascular: RRR, S1/S2 +, no rubs, no gallops Respiratory: CTA bilaterally, no wheezing, no rhonchi Abdominal: Soft, NT, ND, bowel sounds + Extremities: no edema, no cyanosis  Discharge Instructions  Discharge Instructions     Discharge instructions   Complete by: As directed    Please call call MD or return to ER for similar or worsening recurring problem that brought you to hospital or if any fever,nausea/vomiting,abdominal pain, uncontrolled pain, chest pain,  shortness of breath or any other alarming symptoms.  Please follow-up your doctor as instructed in a week time and call the office for appointment.  Please avoid alcohol, smoking, or any other illicit substance and maintain healthy habits including taking your regular medications as prescribed.  You were cared for by a hospitalist during your hospital stay. If you have any questions about your discharge medications or the care you received while you were in the hospital after you are discharged, you can call the unit and ask to speak with the hospitalist on call if the hospitalist that took care of you is not available.  Once you are discharged, your primary care  physician will handle any further medical issues. Please note that NO REFILLS for any discharge medications will be authorized once you are discharged, as it is imperative that you return to your primary care physician (or establish a relationship with a primary care physician if you do not have one) for your aftercare needs so that they can reassess your need for medications and monitor your lab values   Increase activity slowly   Complete by: As directed       Allergies as of 09/01/2023       Reactions   Tegretol [carbamazepine] Other (See Comments)   Caused vertigo for 2 days after taking it   Caffeine Palpitations        Medication List     TAKE these medications    Biktarvy 50-200-25 MG Tabs tablet Generic drug: bictegravir-emtricitabine-tenofovir AF Take 1 tablet by mouth daily.   folic acid 1 MG tablet Commonly known as: FOLVITE Take 1 tablet (1 mg total) by mouth daily. Start taking on: September 02, 2023   levETIRAcetam 500 MG tablet Commonly known as: KEPPRA Take 1 tablet (500 mg total) by mouth 2 (two) times daily.   thiamine 100 MG tablet Commonly known as: VITAMIN B1 Take 1 tablet (100 mg total) by mouth daily. Start taking on: September 02, 2023        Follow-up Information     [facility]. Schedule an appointment as soon as possible for a visit in 2 day(s).   Specialty: Internal Medicine Why: Call to make follow up appt within  2 days from the date you are discharged from the hospital to establish primary care provider services. Contact information: [address] [place] [address] [phone]               Allergies  Allergen Reactions   Tegretol [Carbamazepine] Other (See Comments)    Caused vertigo for 2 days after taking it   Caffeine Palpitations    The results of significant diagnostics from this hospitalization (including imaging, microbiology, ancillary and laboratory) are listed below for  reference.    Microbiology: Recent Results (from the past 240 hour(s))  MRSA Next Gen by PCR, Nasal     Status: None   Collection Time: 08/23/23  4:08 AM   Specimen: Nasal Mucosa; Nasal Swab  Result Value Ref Range Status   MRSA by PCR Next Gen NOT DETECTED NOT DETECTED Final    Comment: (NOTE) The GeneXpert MRSA Assay (FDA approved for NASAL specimens only), is one component of a comprehensive MRSA colonization surveillance program. It is not intended to diagnose MRSA infection nor to guide or monitor treatment for MRSA infections. Test performance is not FDA approved in patients less than 33 years old. Performed at [facility], [address], [place], [place] [address]   Culture, blood (x 2)     Status: None (Preliminary result)   Collection Time: 08/28/23  8:43 PM   Specimen: BLOOD RIGHT HAND  Result Value Ref Range Status   Specimen Description   Final    BLOOD RIGHT HAND Performed at [facility], [address], [place], [place] [address]    Special Requests   Final    BOTTLES DRAWN AEROBIC ONLY Blood Culture adequate volume Performed at [facility], [address] [place], [place] [address]    Culture   Final    NO GROWTH 4 DAYS Performed at [facility], [address] [place], [place] [address]    Report Status PENDING  Incomplete  Culture, blood (x 2)     Status: None (Preliminary result)   Collection Time: 08/28/23  8:43 PM   Specimen: BLOOD RIGHT HAND  Result Value Ref Range Status   Specimen Description   Final    BLOOD RIGHT HAND Performed at [facility], [address], [place], [place] [address]    Special Requests   Final    BOTTLES DRAWN AEROBIC ONLY Blood Culture adequate volume Performed at [facility], [address], [place], [place] [address]    Culture   Final    NO GROWTH 4 DAYS Performed at [facility], [address] [place], [place] [address]     Report Status PENDING  Incomplete    Procedures/Studies: DG CHEST PORT 1 VIEW  Result Date: 08/31/2023 CLINICAL DATA:  Cough EXAM: PORTABLE CHEST 1 VIEW COMPARISON:  Chest x-ray 08/28/2023 FINDINGS: There stable elevation of the left hemidiaphragm. There some strandy opacities in the lung bases, right greater than left. There is a possible small right pleural effusion. The cardiomediastinal silhouette is stable. There is no pneumothorax or acute fracture. IMPRESSION: 1. Strandy opacities in the lung bases, right greater than left, likely atelectasis. 2. Possible small right pleural effusion. Electronically Signed   By: Darliss Cheney M.D.   On: 08/31/2023 23:24   DG Chest Port 1 View  Result Date: 08/28/2023 CLINICAL DATA:  Sepsis. EXAM: PORTABLE CHEST 1 VIEW COMPARISON:  Chest radiograph 05/26/2023. Included lung bases  from abdominopelvic CT 08/22/2023 FINDINGS: Evaluation is technically limited due to significant patient rotation and low lung volumes. The heart size and mediastinal contours are obscured. Possible right infrahilar opacity. No pneumothorax. More detailed assessment is limited. IMPRESSION: Technically limited exam due to significant patient rotation and low lung volumes. Possible right infrahilar opacity. Recommend PA and lateral views when patient is able. Electronically Signed   By: Narda Rutherford M.D.   On: 08/28/2023 22:58   [place] EKG SITE RITE  Result Date: 08/25/2023 If Site Rite image not attached, placement could not be confirmed due to current cardiac rhythm.  CT ABDOMEN PELVIS WO CONTRAST  Result Date: 08/22/2023 CLINICAL DATA:  Abdominal pain. EXAM: CT ABDOMEN AND PELVIS WITHOUT CONTRAST TECHNIQUE: Multidetector CT imaging of the abdomen and pelvis was performed following the standard protocol without IV contrast. RADIATION DOSE REDUCTION: This exam was performed according to the departmental dose-optimization program which includes automated exposure control, adjustment  of the mA and/or kV according to patient size and/or use of iterative reconstruction technique. COMPARISON:  May 24, 2023 FINDINGS: Lower chest: Mild linear atelectasis is seen within the left lung base. Hepatobiliary: There is diffuse fatty infiltration of the liver parenchyma. No focal liver abnormality is seen. There is mild to moderate severity gallbladder distension without evidence of gallstones, gallbladder wall thickening, or biliary dilatation. Pancreas: The head of the pancreas is enlarged, without definite evidence of an underlying mass. A moderate to marked amount of peripancreatic inflammatory fat stranding is also seen within this region. The tail of the pancreas is truncated. There is no evidence of pancreatic ductal dilatation. Spleen: No splenic injury or perisplenic hematoma. Adrenals/Urinary Tract: Adrenal glands are unremarkable. Kidneys are normal, without renal calculi, focal lesion, or hydronephrosis. There is mild to moderate severity diffuse urinary bladder wall thickening. Stomach/Bowel: Stomach is within normal limits. The proximal portion of the duodenum, involving the segment in between the gallbladder and head of the pancreas, is markedly thickened and inflamed. The appendix appears normal. No evidence of bowel dilatation. Vascular/Lymphatic: Very mild aortic atherosclerosis. No enlarged abdominal or pelvic lymph nodes. Reproductive: The prostate gland is mildly enlarged. Other: No abdominal wall hernia or abnormality. A trace amount of posterior pelvic free fluid is noted. Musculoskeletal: No acute or significant osseous findings. IMPRESSION: 1. Findings consistent with acute pancreatitis. Correlation with laboratory values is recommended. 2. Markedly thickened and inflamed proximal portion of the duodenum, likely secondary to adjacent pancreatitis. 3. Mild to moderate severity gallbladder distension without evidence of gallstones or biliary dilatation. 4. Hepatic steatosis. 5. Mild  to moderate severity diffuse urinary bladder wall thickening, which may be secondary to chronic bladder outlet obstruction. Correlation with urinalysis is recommended to exclude acute cystitis. 6. Trace amount of posterior pelvic free fluid. 7. Aortic atherosclerosis. Aortic Atherosclerosis (ICD10-I70.0). Electronically Signed   By: Aram Candela M.D.   On: 08/22/2023 20:39   CT Head Wo Contrast  Result Date: 08/09/2023 CLINICAL DATA:  Recent head trauma EXAM: CT HEAD WITHOUT CONTRAST TECHNIQUE: Contiguous axial images were obtained from the base of the skull through the vertex without intravenous contrast. RADIATION DOSE REDUCTION: This exam was performed according to the departmental dose-optimization program which includes automated exposure control, adjustment of the mA and/or kV according to patient size and/or use of iterative reconstruction technique. COMPARISON:  05/24/2023 FINDINGS: Brain: No evidence of acute infarction, hemorrhage, hydrocephalus, extra-axial collection or mass lesion/mass effect. Vascular: No hyperdense vessel or unexpected calcification. Skull: Normal. Negative for fracture or focal lesion. Sinuses/Orbits:  No acute finding. Other: None. IMPRESSION: No acute intracranial abnormality noted. Electronically Signed   By: Alcide Clever M.D.   On: 08/09/2023 01:31    Labs: BNP (last 3 results) No results for input(s): "BNP" in the last 8760 hours. Basic Metabolic Panel: Recent Labs  Lab 08/26/23 0434 08/27/23 0257 08/28/23 0859 08/29/23 0119 08/30/23 0244 08/31/23 0257 09/01/23 0251  NA 132*   < > 129* 131* 132* 134* 134*  K 3.5   < > 4.4 4.3 3.8 3.6 4.0  CL 97*   < > 95* 100 100 99 100  CO2 26   < > 24 24 22 26 29   GLUCOSE 116*   < > 88 103* 105* 136* 109*  BUN <5*   < > 6 <5* 8 11 8   CREATININE 0.53*   < > 0.39* 0.67 0.74 0.62 0.44*  CALCIUM 8.5*   < > 8.5* 8.7* 8.8* 8.7* 8.9  MG 1.7  --   --   --   --   --   --   PHOS 3.4  --   --   --   --   --   --    < > =  values in this interval not displayed.   Liver Function Tests: Recent Labs  Lab 08/27/23 0257 08/29/23 0119 08/30/23 0244 08/31/23 0257 09/01/23 0251  AST 30 57* 22 18 19   ALT 26 49* 35 28 25  ALKPHOS 69 67 57 65 56  BILITOT 1.1 1.6* 1.0 0.7 0.6  PROT 6.3* 6.2* 6.3* 6.3* 6.4*  ALBUMIN 3.0* 2.7* 2.6* 2.6* 2.7*   No results for input(s): "LIPASE", "AMYLASE" in the last 168 hours. No results for input(s): "AMMONIA" in the last 168 hours. CBC: Recent Labs  Lab 08/28/23 2043 08/29/23 0119 08/30/23 0244 08/31/23 0257 09/01/23 0251  WBC 4.1 5.1 3.6* 3.8* 4.0  NEUTROABS 2.9  --   --   --   --   HGB 10.9* 10.8* 10.5* 10.2* 11.0*  HCT 32.9* 33.1* 32.2* 31.8* 34.5*  MCV 100.0 103.4* 101.9* 102.3* 102.7*  PLT 185 164 221 272 307   Cardiac Enzymes: No results for input(s): "CKTOTAL", "CKMB", "CKMBINDEX", "TROPONINI" in the last 168 hours. BNP: Invalid input(s): "POCBNP" CBG: Recent Labs  Lab 08/31/23 0333 08/31/23 0808 08/31/23 1613 08/31/23 2327 09/01/23 0853  GLUCAP 133* 102* 147* 123* 97   D-Dimer No results for input(s): "DDIMER" in the last 72 hours. Hgb A1c No results for input(s): "HGBA1C" in the last 72 hours. Lipid Profile No results for input(s): "CHOL", "HDL", "LDLCALC", "TRIG", "CHOLHDL", "LDLDIRECT" in the last 72 hours. Thyroid function studies No results for input(s): "TSH", "T4TOTAL", "T3FREE", "THYROIDAB" in the last 72 hours.  Invalid input(s): "FREET3" Anemia work up No results for input(s): "VITAMINB12", "FOLATE", "FERRITIN", "TIBC", "IRON", "RETICCTPCT" in the last 72 hours. Urinalysis    Component Value Date/Time   COLORURINE YELLOW 08/29/2023 0147   APPEARANCEUR CLEAR 08/29/2023 0147   APPEARANCEUR Clear 11/15/2014 1755   LABSPEC 1.004 (L) 08/29/2023 0147   LABSPEC 1.002 11/15/2014 1755   PHURINE 8.0 08/29/2023 0147   GLUCOSEU NEGATIVE 08/29/2023 0147   GLUCOSEU Negative 11/15/2014 1755   HGBUR NEGATIVE 08/29/2023 0147   BILIRUBINUR  NEGATIVE 08/29/2023 0147   BILIRUBINUR Negative 11/15/2014 1755   KETONESUR NEGATIVE 08/29/2023 0147   PROTEINUR NEGATIVE 08/29/2023 0147   NITRITE NEGATIVE 08/29/2023 0147   LEUKOCYTESUR NEGATIVE 08/29/2023 0147   LEUKOCYTESUR Negative 11/15/2014 1755   Sepsis Labs Recent Labs  Lab 08/29/23  0119 08/30/23 0244 08/31/23 0257 09/01/23 0251  WBC 5.1 3.6* 3.8* 4.0   Microbiology Recent Results (from the past 240 hour(s))  MRSA Next Gen by PCR, Nasal     Status: None   Collection Time: 08/23/23  4:08 AM   Specimen: Nasal Mucosa; Nasal Swab  Result Value Ref Range Status   MRSA by PCR Next Gen NOT DETECTED NOT DETECTED Final    Comment: (NOTE) The GeneXpert MRSA Assay (FDA approved for NASAL specimens only), is one component of a comprehensive MRSA colonization surveillance program. It is not intended to diagnose MRSA infection nor to guide or monitor treatment for MRSA infections. Test performance is not FDA approved in patients less than 76 years old. Performed at [facility], [address] [place], [place] [address]   Culture, blood (x 2)     Status: None (Preliminary result)   Collection Time: 08/28/23  8:43 PM   Specimen: BLOOD RIGHT HAND  Result Value Ref Range Status   Specimen Description   Final    BLOOD RIGHT HAND Performed at [facility], [address], [place], [place] [address]    Special Requests   Final    BOTTLES DRAWN AEROBIC ONLY Blood Culture adequate volume Performed at [facility], [address] [place], [place] [address]    Culture   Final    NO GROWTH 4 DAYS Performed at [facility], [address] [place], [place] [address]    Report Status PENDING  Incomplete  Culture, blood (x 2)     Status: None (Preliminary result)   Collection Time: 08/28/23  8:43 PM   Specimen: BLOOD RIGHT HAND  Result Value Ref Range Status   Specimen Description   Final    BLOOD RIGHT HAND Performed at [facility], [address] [place], [place] [address]    Special Requests   Final    BOTTLES DRAWN AEROBIC ONLY Blood Culture adequate volume Performed at [facility], [address] [place], [place] [address]    Culture   Final    NO GROWTH 4 DAYS Performed at [facility], [address] [place], [place] [address]    Report Status PENDING  Incomplete     Time coordinating discharge: 35 minutes  SIGNED: Lanae Boast, MD  [facility] 09/01/2023, 1:45 PM  If 7PM-7AM, please contact night-coverage [email or web address]

## 2023-09-01 NOTE — Evaluation (Signed)
Physical Therapy Evaluation Patient Details Name: Gary [name] MRN: [id number] DOB: 03/03/1986 Today's Date: 09/01/2023  History of Present Illness  Patient is a 37 year old male who presented to the hosptial on 10/16 with  nausea vomiting abdominal pain.  He drinks about 2/5 of liquor daily and could not get any Lasix since being a Sunday so he drank 2 of 40 ounce malt liquor then started having severe epigastric pain nausea vomiting. PMH significant for alcohol use disorder with hx of withdrawal seizures, polysubstance use, recurrent alcohol associated pancreatitis, HIV, PTSD, anxiety, bipolar disorder, homelessness.  Clinical Impression  The patient ambulated x ~ 400' with no device, declined need for a cane. Patient reports that he plans to stay with a friend and wants to quit alcohol. Patient with mild deficits and reports multiple falls ( because I drink). Patient does endorse peripheral neuropathy and reports ambulates best in his boots so he will not hit the toes that are painful.  Noted right foot drop mild . No further PT needs . Will have Mobility specialist follow as patient remains in hospital. PT will sign off.      If plan is discharge home, recommend the following: Help with stairs or ramp for entrance;Assist for transportation;Assistance with cooking/housework   Can travel by private vehicle        Equipment Recommendations None recommended by PT  Recommendations for Other Services       Functional Status Assessment Patient has had a recent decline in their functional status and/or demonstrates limited ability to make significant improvements in function in a reasonable and predictable amount of time     Precautions / Restrictions Precautions Precautions: Fall Restrictions Weight Bearing Restrictions: No      Mobility  Bed Mobility Overal bed mobility: Independent                  Transfers Overall transfer level: Needs assistance Equipment used:  None Transfers: Sit to/from Stand             General transfer comment: patient stands with wide base , moves slowly    Ambulation/Gait Ambulation/Gait assistance: Supervision Gait Distance (Feet): 400 Feet Assistive device: None Gait Pattern/deviations: Step-to pattern, Step-through pattern, Wide base of support Gait velocity: decr     General Gait Details: patient  lateral shifts  to advance the legs at times, noted  right foot drop. patient staggers but no loss of balance  Stairs            Wheelchair Mobility     Tilt Bed    Modified Rankin (Stroke Patients Only)       Balance Overall balance assessment: Mild deficits observed, not formally tested                                           Pertinent Vitals/Pain Pain Assessment Pain Assessment: Faces Faces Pain Scale: Hurts even more Pain Location: abdomen Pain Descriptors / Indicators: Discomfort Pain Intervention(s): Limited activity within patient's tolerance    Home Living Family/patient expects to be discharged to:: Shelter/Homeless                   Additional Comments: patient reported plan was to go stay with friends to continue detoxing    Prior Function Prior Level of Function : Independent/Modified Independent  Mobility Comments: reported having so many falls he was unable to count them all       Extremity/Trunk Assessment   Upper Extremity Assessment Upper Extremity Assessment: Overall WFL for tasks assessed (patietn reported history of R shoulder disolcations over 5. patient then reported having had fracture history on L side.)    Lower Extremity Assessment Lower Extremity Assessment: Generalized weakness (reports peripheral neuropathy, does best with boots)    Cervical / Trunk Assessment Cervical / Trunk Assessment: Normal  Communication   Communication Communication: No apparent difficulties  Cognition Arousal: Alert Behavior  During Therapy: Flat affect Overall Cognitive Status: Within Functional Limits for tasks assessed                                 General Comments: patient was knowledgeable about his detox process. patient was not open to education during session.        General Comments      Exercises     Assessment/Plan    PT Assessment Patient does not need any further PT services  PT Problem List Decreased knowledge of precautions;Decreased range of motion;Impaired sensation       PT Treatment Interventions Therapeutic activities;Functional mobility training;Gait training    PT Goals (Current goals can be found in the Care Plan section)  Acute Rehab PT Goals Patient Stated Goal: get out PT Goal Formulation: All assessment and education complete, DC therapy    Frequency       Co-evaluation PT/OT/SLP Co-Evaluation/Treatment: Yes Reason for Co-Treatment: Necessary to address cognition/behavior during functional activity PT goals addressed during session: Mobility/safety with mobility OT goals addressed during session: ADL's and self-care       AM-PAC PT "6 Clicks" Mobility  Outcome Measure Help needed turning from your back to your side while in a flat bed without using bedrails?: None Help needed moving from lying on your back to sitting on the side of a flat bed without using bedrails?: None Help needed moving to and from a bed to a chair (including a wheelchair)?: None Help needed standing up from a chair using your arms (e.g., wheelchair or bedside chair)?: None Help needed to walk in hospital room?: None Help needed climbing 3-5 steps with a railing? : A Little 6 Click Score: 23    End of Session Equipment Utilized During Treatment: Gait belt Activity Tolerance: Patient tolerated treatment well Patient left: in bed;with nursing/sitter in room Nurse Communication: Mobility status PT Visit Diagnosis: Unsteadiness on feet (R26.81);Other symptoms and signs  involving the nervous system (R29.898)    Time: 1610-9604 PT Time Calculation (min) (ACUTE ONLY): 18 min   Charges:   PT Evaluation $PT Eval Low Complexity: 1 Low   PT General Charges $$ ACUTE PT VISIT: 1 Visit         [name] PT Acute Rehabilitation Services Office [phone] Weekend [phone]   [name] 09/01/2023, 1:45 PM

## 2023-09-01 NOTE — Plan of Care (Signed)
  Problem: Education: Goal: Knowledge of General Education information will improve Description: Including pain rating scale, medication(s)/side effects and non-pharmacologic comfort measures Outcome: Progressing   Problem: Health Behavior/Discharge Planning: Goal: Ability to manage health-related needs will improve Outcome: Progressing   Problem: Clinical Measurements: Goal: Ability to maintain clinical measurements within normal limits will improve Outcome: Progressing Goal: Will remain free from infection Outcome: Progressing Goal: Diagnostic test results will improve Outcome: Progressing Goal: Respiratory complications will improve Outcome: Progressing   Problem: Nutrition: Goal: Adequate nutrition will be maintained Outcome: Progressing   Problem: Coping: Goal: Level of anxiety will decrease Outcome: Progressing   Problem: Safety: Goal: Ability to remain free from injury will improve Outcome: Progressing

## 2023-09-01 NOTE — Progress Notes (Signed)
PT provided bus pass and resources for shelter prior to leaving hospital. PT ambulated from ICU with nurse off unit

## 2023-09-01 NOTE — Evaluation (Signed)
Occupational Therapy Evaluation Patient Details Name: Gary [name] MRN: [id number] DOB: 10-23-1986 Today's Date: 09/01/2023   History of Present Illness Patient is a 37 year old male who presented to the hosptial on 10/16 with  nausea vomiting abdominal pain.  He drinks about 2/5 of liquor daily and could not get any Lasix since being a Sunday so he drank 2 of 40 ounce malt liquor then started having severe epigastric pain nausea vomiting. PMH significant for alcohol use disorder with hx of withdrawal seizures, polysubstance use, recurrent alcohol associated pancreatitis, HIV, PTSD, anxiety, bipolar disorder, homelessness.   Clinical Impression   Patient evaluated by Occupational Therapy with no further acute OT needs identified. All education has been completed and the patient has no further questions. Patient endorsed being at baseline for ADLs at this time.  See below for any follow-up Occupational Therapy or equipment needs. OT is signing off. Thank you for this referral.        If plan is discharge home, recommend the following: Assistance with cooking/housework;Direct supervision/assist for medications management;Direct supervision/assist for financial management    Functional Status Assessment  Patient has had a recent decline in their functional status and demonstrates the ability to make significant improvements in function in a reasonable and predictable amount of time.  Equipment Recommendations  None recommended by OT       Precautions / Restrictions Precautions Precautions: Fall Restrictions Weight Bearing Restrictions: No      Mobility Bed Mobility Overal bed mobility: Needs Assistance Bed Mobility: Supine to Sit     Supine to sit: Modified independent (Device/Increase time)          Transfers                          Balance Overall balance assessment: Mild deficits observed, not formally tested                                          ADL either performed or assessed with clinical judgement   ADL Overall ADL's : At baseline       General ADL Comments: Patient appears to be at baseline in ADLs at this time. Patient was supervision level during session. patient fixated on not bumping toes with reports that he would have too much pain if they were touched. patient noted to have some shakiness in BUE at rest but patient reported that this was part of his detox process and was still able to participate in tasks. patient endorsed being at baseline and wanting to leave the hosptial. patient completed toileting tasks standing with supervision. patient noted to have wide base of support with functional mobility in room and in hallway.     Vision   Vision Assessment?: No apparent visual deficits            Pertinent Vitals/Pain Pain Assessment Pain Assessment: No/denies pain     Extremity/Trunk Assessment Upper Extremity Assessment Upper Extremity Assessment: Overall WFL for tasks assessed (patietn reported history of R shoulder disolcations over 5. patient then reported having had fracture history on L side.)   Lower Extremity Assessment Lower Extremity Assessment: Defer to PT evaluation          Cognition Arousal: Alert Behavior During Therapy: Flat affect Overall Cognitive Status: Within Functional Limits for tasks assessed       General Comments: patient  was knowledgeable about his detox process. patient was not open to education during session.                Home Living Family/patient expects to be discharged to:: Shelter/Homeless       Additional Comments: patient reported plan was to go stay with friends to continue detoxing      Prior Functioning/Environment Prior Level of Function : Independent/Modified Independent             Mobility Comments: reported having so many falls he was unable to count them all                   OT Goals(Current goals can be found in the  care plan section) Acute Rehab OT Goals Patient Stated Goal: to get out of the hosptial OT Goal Formulation: All assessment and education complete, DC therapy  OT Frequency:      Co-evaluation PT/OT/SLP Co-Evaluation/Treatment: Yes Reason for Co-Treatment: Necessary to address cognition/behavior during functional activity PT goals addressed during session: Mobility/safety with mobility OT goals addressed during session: ADL's and self-care      AM-PAC OT "6 Clicks" Daily Activity     Outcome Measure Help from another person eating meals?: None Help from another person taking care of personal grooming?: None Help from another person toileting, which includes using toliet, bedpan, or urinal?: A Little Help from another person bathing (including washing, rinsing, drying)?: A Little Help from another person to put on and taking off regular upper body clothing?: A Little Help from another person to put on and taking off regular lower body clothing?: A Little (supervision for tasks on feet high falls risk) 6 Click Score: 20   End of Session Equipment Utilized During Treatment: Gait belt Nurse Communication: Mobility status  Activity Tolerance: Patient tolerated treatment well Patient left: in bed;with call bell/phone within reach;with nursing/sitter in room  OT Visit Diagnosis: Unsteadiness on feet (R26.81)                Time: 9562-1308 OT Time Calculation (min): 18 min Charges:  OT General Charges $OT Visit: 1 Visit OT Evaluation $OT Eval Low Complexity: 1 Low  Vergil Burby OTR/L, MS Acute Rehabilitation Department Office# [phone]   Selinda Flavin 09/01/2023, 12:51 PM

## 2023-09-01 NOTE — TOC Transition Note (Signed)
Transition of Care [facility]) - CM/SW Discharge Note   Patient Details  Name: Gary [name] MRN: [id number] Date of Birth: 1986/07/28  Transition of Care [facility]) CM/SW Contact:  Lavenia Atlas, RN Phone Number: 09/01/2023, 1:57 PM   Clinical Narrative:   This RNCM provided patient with bus pass for discharge. No current TOC needs.    Final next level of care: Home/Self Care Barriers to Discharge: No Barriers Identified   Patient Goals and CMS Choice CMS Medicare.gov Compare Post Acute Care list provided to:: Patient Choice offered to / list presented to : Patient  Discharge Placement                         Discharge Plan and Services Additional resources added to the After Visit Summary for                  DME Arranged: N/A DME Agency: NA       HH Arranged: NA HH Agency: NA        Social Determinants of Health (SDOH) Interventions SDOH Screenings   Food Insecurity: Food Insecurity Present (08/23/2023)  Housing: Medium Risk (08/23/2023)  Transportation Needs: Unmet Transportation Needs (08/23/2023)  Utilities: Patient Declined (08/23/2023)  Alcohol Screen: High Risk (07/25/2021)  Depression (PHQ2-9): Medium Risk (02/12/2021)  Financial Resource Strain: High Risk (06/30/2022)   Received from [facility], [facility]  Social Connections: Unknown (06/24/2022)   Received from [facility], [facility]  Stress: No Stress Concern Present (06/30/2022)   Received from [facility], [facility]  Recent Concern: Stress - Stress Concern Present (06/25/2022)   Received from [facility]  Tobacco Use: High Risk (08/24/2023)     Readmission Risk Interventions    08/23/2023    6:11 PM 06/15/2023   10:18 AM 05/25/2023    1:34 PM  Readmission Risk Prevention Plan  Transportation Screening Complete Complete Complete  Medication Review Oceanographer) Complete Complete Complete  PCP or Specialist appointment within 3-5 days of discharge Complete  Complete   HRI or Home Care Consult Complete Complete Complete  SW Recovery Care/Counseling Consult Complete Complete Complete  Palliative Care Screening Not Applicable Not Applicable Not Applicable  Skilled Nursing Facility Not Applicable Not Applicable Not Applicable

## 2023-09-01 NOTE — Plan of Care (Signed)
Pt ready for discharge

## 2023-09-02 ENCOUNTER — Other Ambulatory Visit (HOSPITAL_COMMUNITY): Payer: Self-pay

## 2023-09-02 LAB — CULTURE, BLOOD (ROUTINE X 2)
Special Requests: ADEQUATE
Special Requests: ADEQUATE

## 2023-09-06 ENCOUNTER — Other Ambulatory Visit (HOSPITAL_COMMUNITY): Payer: Self-pay

## 2023-09-07 ENCOUNTER — Other Ambulatory Visit (HOSPITAL_COMMUNITY): Payer: Self-pay

## 2023-09-08 ENCOUNTER — Other Ambulatory Visit (HOSPITAL_COMMUNITY): Payer: Self-pay

## 2023-09-10 ENCOUNTER — Emergency Department (HOSPITAL_COMMUNITY)
Admission: EM | Admit: 2023-09-10 | Discharge: 2023-09-11 | Discharge status: Against Medical Advice | Payer: MEDICAID | Attending: Emergency Medicine | Admitting: Emergency Medicine

## 2023-09-10 ENCOUNTER — Other Ambulatory Visit: Payer: Self-pay

## 2023-09-10 ENCOUNTER — Encounter (HOSPITAL_COMMUNITY): Payer: Self-pay | Admitting: Emergency Medicine

## 2023-09-10 DIAGNOSIS — R109 Unspecified abdominal pain: Secondary | ICD-10-CM | POA: Diagnosis present

## 2023-09-10 DIAGNOSIS — R112 Nausea with vomiting, unspecified: Secondary | ICD-10-CM | POA: Diagnosis not present

## 2023-09-10 DIAGNOSIS — Z5321 Procedure and treatment not carried out due to patient leaving prior to being seen by health care provider: Secondary | ICD-10-CM | POA: Diagnosis not present

## 2023-09-10 DIAGNOSIS — R197 Diarrhea, unspecified: Secondary | ICD-10-CM | POA: Diagnosis not present

## 2023-09-10 NOTE — ED Triage Notes (Signed)
[place] [facility] [place] with abdominal pain and N/V/D. Admits to alcohol today.

## 2023-09-11 ENCOUNTER — Encounter (HOSPITAL_COMMUNITY): Payer: Self-pay

## 2023-09-11 ENCOUNTER — Emergency Department (HOSPITAL_COMMUNITY)
Admission: EM | Admit: 2023-09-11 | Discharge: 2023-09-11 | Discharge status: Against Medical Advice | Payer: MEDICAID | Source: Home / Self Care

## 2023-09-11 DIAGNOSIS — Z5321 Procedure and treatment not carried out due to patient leaving prior to being seen by health care provider: Secondary | ICD-10-CM | POA: Insufficient documentation

## 2023-09-11 DIAGNOSIS — R109 Unspecified abdominal pain: Secondary | ICD-10-CM | POA: Insufficient documentation

## 2023-09-11 LAB — COMPREHENSIVE METABOLIC PANEL
ALT: 99 U/L — ABNORMAL HIGH (ref 0–44)
AST: 191 U/L — ABNORMAL HIGH (ref 15–41)
Albumin: 4.1 g/dL (ref 3.5–5.0)
Alkaline Phosphatase: 81 U/L (ref 38–126)
Anion gap: 15 (ref 5–15)
BUN: 6 mg/dL (ref 6–20)
CO2: 25 mmol/L (ref 22–32)
Calcium: 8.9 mg/dL (ref 8.9–10.3)
Chloride: 98 mmol/L (ref 98–111)
Creatinine, Ser: 0.45 mg/dL — ABNORMAL LOW (ref 0.61–1.24)
GFR, Estimated: >60 mL/min (ref >60)
Glucose, Bld: 169 mg/dL — ABNORMAL HIGH (ref 70–99)
Potassium: 3.6 mmol/L (ref 3.5–5.1)
Sodium: 138 mmol/L (ref 135–145)
Total Bilirubin: 0.9 mg/dL (ref 0.3–1.2)
Total Protein: 8.3 g/dL — ABNORMAL HIGH (ref 6.5–8.1)

## 2023-09-11 LAB — CBC
HCT: 40.6 % (ref 39.0–52.0)
Hemoglobin: 13.6 g/dL (ref 13.0–17.0)
MCH: 32.9 pg (ref 26.0–34.0)
MCHC: 33.5 g/dL (ref 30.0–36.0)
MCV: 98.3 fL (ref 80.0–100.0)
Platelets: 628 10*3/uL — ABNORMAL HIGH (ref 150–400)
RBC: 4.13 MIL/uL — ABNORMAL LOW (ref 4.22–5.81)
RDW: 16.6 % — ABNORMAL HIGH (ref 11.5–15.5)
WBC: 7.6 10*3/uL (ref 4.0–10.5)
nRBC: 0 % (ref 0.0–0.2)

## 2023-09-11 LAB — LIPASE, BLOOD: Lipase: 205 U/L — ABNORMAL HIGH (ref 11–51)

## 2023-09-11 NOTE — ED Triage Notes (Signed)
Patient arrives via EMS for abdominal pain. Patient left AMA earlier tonight. Patient has a history of pancreatitis. EMS reports the patient was drinking alcohol when they arrived. Patient was picked down the street next tot main parking lot.

## 2023-09-11 NOTE — ED Notes (Signed)
Security states the patient left.

## 2023-09-12 ENCOUNTER — Other Ambulatory Visit: Payer: Self-pay

## 2023-09-12 ENCOUNTER — Emergency Department (HOSPITAL_COMMUNITY)
Admission: EM | Admit: 2023-09-12 | Discharge: 2023-09-13 | Disposition: A | Discharge status: Home / Self Care | Payer: MEDICAID | Attending: Emergency Medicine | Admitting: Emergency Medicine

## 2023-09-12 ENCOUNTER — Encounter (HOSPITAL_COMMUNITY): Payer: Self-pay

## 2023-09-12 DIAGNOSIS — F1010 Alcohol abuse, uncomplicated: Secondary | ICD-10-CM

## 2023-09-12 DIAGNOSIS — K852 Alcohol induced acute pancreatitis without necrosis or infection: Secondary | ICD-10-CM | POA: Insufficient documentation

## 2023-09-12 DIAGNOSIS — Z5900 Homelessness unspecified: Secondary | ICD-10-CM | POA: Diagnosis not present

## 2023-09-12 DIAGNOSIS — R1010 Upper abdominal pain, unspecified: Secondary | ICD-10-CM | POA: Diagnosis present

## 2023-09-12 LAB — CBC
HCT: 38.7 % — ABNORMAL LOW (ref 39.0–52.0)
Hemoglobin: 13 g/dL (ref 13.0–17.0)
MCH: 33.4 pg (ref 26.0–34.0)
MCHC: 33.6 g/dL (ref 30.0–36.0)
MCV: 99.5 fL (ref 80.0–100.0)
Platelets: 361 K/uL (ref 150–400)
RBC: 3.89 MIL/uL — ABNORMAL LOW (ref 4.22–5.81)
RDW: 15.9 % — ABNORMAL HIGH (ref 11.5–15.5)
WBC: 4.7 K/uL (ref 4.0–10.5)
nRBC: 0 % (ref 0.0–0.2)

## 2023-09-12 LAB — COMPREHENSIVE METABOLIC PANEL
ALT: 65 U/L — ABNORMAL HIGH (ref 0–44)
AST: 64 U/L — ABNORMAL HIGH (ref 15–41)
Albumin: 3.8 g/dL (ref 3.5–5.0)
Alkaline Phosphatase: 74 U/L (ref 38–126)
Anion gap: 12 (ref 5–15)
BUN: <5 mg/dL — ABNORMAL LOW (ref 6–20)
CO2: 28 mmol/L (ref 22–32)
Calcium: 8.4 mg/dL — ABNORMAL LOW (ref 8.9–10.3)
Chloride: 98 mmol/L (ref 98–111)
Creatinine, Ser: 0.61 mg/dL (ref 0.61–1.24)
GFR, Estimated: >60 mL/min (ref >60)
Glucose, Bld: 105 mg/dL — ABNORMAL HIGH (ref 70–99)
Potassium: 3.3 mmol/L — ABNORMAL LOW (ref 3.5–5.1)
Sodium: 138 mmol/L (ref 135–145)
Total Bilirubin: 0.5 mg/dL (ref 0.3–1.2)
Total Protein: 7.8 g/dL (ref 6.5–8.1)

## 2023-09-12 LAB — URINALYSIS, ROUTINE W REFLEX MICROSCOPIC
Bacteria, UA: NONE SEEN
Bilirubin Urine: NEGATIVE
Glucose, UA: NEGATIVE mg/dL
Hgb urine dipstick: NEGATIVE
Ketones, ur: NEGATIVE mg/dL
Leukocytes,Ua: NEGATIVE
Nitrite: NEGATIVE
Protein, ur: 100 mg/dL — AB
Specific Gravity, Urine: 1.031 — ABNORMAL HIGH (ref 1.005–1.030)
pH: 5 (ref 5.0–8.0)

## 2023-09-12 LAB — LIPASE, BLOOD: Lipase: 220 U/L — ABNORMAL HIGH (ref 11–51)

## 2023-09-12 NOTE — ED Provider Notes (Signed)
EMERGENCY DEPARTMENT AT [facility] Provider Note   CSN: [id number] Arrival date & time: 09/12/23  1941     History {Add pertinent medical, surgical, social history, OB history to HPI:1} Chief Complaint  Patient presents with   Abdominal Pain    Gary [name] is a 37 y.o. male.  Presents to the emergency department for evaluation of abdominal pain.  Patient reporting upper abdominal pain that is constant.  He reports a history of pancreatitis and continues to drink alcohol.       Home Medications Prior to Admission medications   Medication Sig Start Date End Date Taking? Authorizing Provider  bictegravir-emtricitabine-tenofovir AF (BIKTARVY) 50-200-25 MG TABS tablet Take 1 tablet by mouth daily.    [provider]  folic acid (FOLVITE) 1 MG tablet Take 1 tablet (1 mg total) by mouth daily. 09/02/23 10/02/23  Lanae Boast, MD  levETIRAcetam (KEPPRA) 500 MG tablet Take 1 tablet (500 mg total) by mouth 2 (two) times daily. 08/03/23   Dione Booze, MD  thiamine (VITAMIN B-1) 100 MG tablet Take 1 tablet (100 mg total) by mouth daily. 09/02/23 10/02/23  Lanae Boast, MD  famotidine (PEPCID) 20 MG tablet Take 1 tablet (20 mg total) by mouth 2 (two) times daily. Patient not taking: Reported on 06/01/2019 05/14/19 06/02/19  Benjiman Core, MD      Allergies    Tegretol [carbamazepine] and Caffeine    Review of Systems   Review of Systems  Physical Exam Updated Vital Signs BP 119/89 (BP Location: Left Arm)   Pulse 96   Temp 98.6 F (37 C) (Oral)   Resp 16   SpO2 100%  Physical Exam Vitals and nursing note reviewed.  Constitutional:      General: He is not in acute distress.    Appearance: He is well-developed.  HENT:     Head: Normocephalic and atraumatic.     Mouth/Throat:     Mouth: Mucous membranes are moist.  Eyes:     General: Vision grossly intact. Gaze aligned appropriately.     Extraocular Movements: Extraocular movements intact.      Conjunctiva/sclera: Conjunctivae normal.  Cardiovascular:     Rate and Rhythm: Normal rate and regular rhythm.     Pulses: Normal pulses.     Heart sounds: Normal heart sounds, S1 normal and S2 normal. No murmur heard.    No friction rub. No gallop.  Pulmonary:     Effort: Pulmonary effort is normal. No respiratory distress.     Breath sounds: Normal breath sounds.  Abdominal:     Palpations: Abdomen is soft.     Tenderness: There is abdominal tenderness in the left upper quadrant. There is no guarding or rebound.     Hernia: No hernia is present.  Musculoskeletal:        General: No swelling.     Cervical back: Full passive range of motion without pain, normal range of motion and neck supple. No pain with movement, spinous process tenderness or muscular tenderness. Normal range of motion.     Right lower leg: No edema.     Left lower leg: No edema.  Skin:    General: Skin is warm and dry.     Capillary Refill: Capillary refill takes less than 2 seconds.     Findings: No ecchymosis, erythema, lesion or wound.  Neurological:     Mental Status: He is alert and oriented to person, place, and time.     GCS: GCS  eye subscore is 4. GCS verbal subscore is 5. GCS motor subscore is 6.     Cranial Nerves: Cranial nerves 2-12 are intact.     Sensory: Sensation is intact.     Motor: Motor function is intact. No weakness or abnormal muscle tone.     Coordination: Coordination is intact.  Psychiatric:        Mood and Affect: Mood normal.        Speech: Speech normal.        Behavior: Behavior normal.     ED Results / Procedures / Treatments   Labs (all labs ordered are listed, but only abnormal results are displayed) Labs Reviewed  LIPASE, BLOOD - Abnormal; Notable for the following components:      Result Value   Lipase 220 (*)    All other components within normal limits  COMPREHENSIVE METABOLIC PANEL - Abnormal; Notable for the following components:   Potassium 3.3 (*)    Glucose,  Bld 105 (*)    BUN <5 (*)    Calcium 8.4 (*)    AST 64 (*)    ALT 65 (*)    All other components within normal limits  CBC - Abnormal; Notable for the following components:   RBC 3.89 (*)    HCT 38.7 (*)    RDW 15.9 (*)    All other components within normal limits  URINALYSIS, ROUTINE W REFLEX MICROSCOPIC - Abnormal; Notable for the following components:   Color, Urine AMBER (*)    Specific Gravity, Urine 1.031 (*)    Protein, ur 100 (*)    All other components within normal limits    EKG None  Radiology No results found.  Procedures Procedures  {Document cardiac monitor, telemetry assessment procedure when appropriate:1}  Medications Ordered in ED Medications - No data to display  ED Course/ Medical Decision Making/ A&P   {   Click here for ABCD2, HEART and other calculatorsREFRESH Note before signing :1}                              Medical Decision Making  ***  {Document critical care time when appropriate:1} {Document review of labs and clinical decision tools ie heart score, Chads2Vasc2 etc:1}  {Document your independent review of radiology images, and any outside records:1} {Document your discussion with family members, caretakers, and with consultants:1} {Document social determinants of health affecting pt's care:1} {Document your decision making why or why not admission, treatments were needed:1} Final Clinical Impression(s) / ED Diagnoses Final diagnoses:  None    Rx / DC Orders ED Discharge Orders     None

## 2023-09-12 NOTE — ED Triage Notes (Signed)
Pt. BIB [facility] for abdominal pain. Pt. Checked in yesterday for the same, but left AMA. Hx of pancreatitis. Pt. Admits to having alcohol today. Endorses vomiting.

## 2023-09-13 MED ORDER — LORAZEPAM 1 MG PO TABS
1.0000 mg | ORAL_TABLET | Freq: Once | ORAL | Status: AC
  Administered 2023-09-13: 1 mg via ORAL
  Filled 2023-09-13: qty 1

## 2023-09-13 MED ORDER — OXYCODONE-ACETAMINOPHEN 5-325 MG PO TABS
1.0000 | ORAL_TABLET | Freq: Once | ORAL | Status: AC
  Administered 2023-09-13: 1 via ORAL
  Filled 2023-09-13: qty 1

## 2023-09-13 MED ORDER — OXYCODONE-ACETAMINOPHEN 5-325 MG PO TABS
2.0000 | ORAL_TABLET | Freq: Once | ORAL | Status: AC
  Administered 2023-09-13: 2 via ORAL
  Filled 2023-09-13: qty 2

## 2023-09-13 MED ORDER — FAMOTIDINE 20 MG PO TABS
40.0000 mg | ORAL_TABLET | Freq: Once | ORAL | Status: AC
  Administered 2023-09-13: 40 mg via ORAL
  Filled 2023-09-13: qty 2

## 2023-09-14 ENCOUNTER — Other Ambulatory Visit (HOSPITAL_COMMUNITY): Payer: Self-pay

## 2023-09-24 ENCOUNTER — Other Ambulatory Visit: Payer: Self-pay

## 2023-09-24 ENCOUNTER — Encounter (HOSPITAL_COMMUNITY): Payer: Self-pay

## 2023-09-24 ENCOUNTER — Other Ambulatory Visit (HOSPITAL_COMMUNITY): Payer: Self-pay

## 2023-09-24 ENCOUNTER — Inpatient Hospital Stay (HOSPITAL_COMMUNITY)
Admission: EM | Admit: 2023-09-24 | Discharge: 2023-09-30 | DRG: 439 | Disposition: A | Discharge status: Home / Self Care | Payer: MEDICAID | Attending: Family Medicine | Admitting: Family Medicine

## 2023-09-24 DIAGNOSIS — R7401 Elevation of levels of liver transaminase levels: Secondary | ICD-10-CM | POA: Diagnosis present

## 2023-09-24 DIAGNOSIS — Z5900 Homelessness unspecified: Secondary | ICD-10-CM

## 2023-09-24 DIAGNOSIS — K760 Fatty (change of) liver, not elsewhere classified: Secondary | ICD-10-CM | POA: Diagnosis present

## 2023-09-24 DIAGNOSIS — Z800 Family history of malignant neoplasm of digestive organs: Secondary | ICD-10-CM

## 2023-09-24 DIAGNOSIS — F3181 Bipolar II disorder: Secondary | ICD-10-CM | POA: Diagnosis present

## 2023-09-24 DIAGNOSIS — G40909 Epilepsy, unspecified, not intractable, without status epilepticus: Secondary | ICD-10-CM | POA: Diagnosis present

## 2023-09-24 DIAGNOSIS — Z21 Asymptomatic human immunodeficiency virus [HIV] infection status: Secondary | ICD-10-CM | POA: Diagnosis present

## 2023-09-24 DIAGNOSIS — F4310 Post-traumatic stress disorder, unspecified: Secondary | ICD-10-CM | POA: Diagnosis present

## 2023-09-24 DIAGNOSIS — E876 Hypokalemia: Secondary | ICD-10-CM | POA: Diagnosis present

## 2023-09-24 DIAGNOSIS — F209 Schizophrenia, unspecified: Secondary | ICD-10-CM | POA: Diagnosis present

## 2023-09-24 DIAGNOSIS — Z9109 Other allergy status, other than to drugs and biological substances: Secondary | ICD-10-CM

## 2023-09-24 DIAGNOSIS — F10230 Alcohol dependence with withdrawal, uncomplicated: Secondary | ICD-10-CM | POA: Diagnosis present

## 2023-09-24 DIAGNOSIS — K852 Alcohol induced acute pancreatitis without necrosis or infection: Principal | ICD-10-CM | POA: Diagnosis present

## 2023-09-24 DIAGNOSIS — F17210 Nicotine dependence, cigarettes, uncomplicated: Secondary | ICD-10-CM | POA: Diagnosis present

## 2023-09-24 DIAGNOSIS — Y908 Blood alcohol level of 240 mg/100 ml or more: Secondary | ICD-10-CM | POA: Diagnosis present

## 2023-09-24 DIAGNOSIS — F1020 Alcohol dependence, uncomplicated: Secondary | ICD-10-CM | POA: Diagnosis present

## 2023-09-24 DIAGNOSIS — Z720 Tobacco use: Secondary | ICD-10-CM | POA: Diagnosis present

## 2023-09-24 DIAGNOSIS — F1010 Alcohol abuse, uncomplicated: Secondary | ICD-10-CM | POA: Diagnosis present

## 2023-09-24 DIAGNOSIS — T50996A Underdosing of other drugs, medicaments and biological substances, initial encounter: Secondary | ICD-10-CM | POA: Diagnosis present

## 2023-09-24 DIAGNOSIS — Z888 Allergy status to other drugs, medicaments and biological substances status: Secondary | ICD-10-CM

## 2023-09-24 DIAGNOSIS — D7589 Other specified diseases of blood and blood-forming organs: Secondary | ICD-10-CM | POA: Diagnosis present

## 2023-09-24 DIAGNOSIS — Z91128 Patient's intentional underdosing of medication regimen for other reason: Secondary | ICD-10-CM

## 2023-09-24 DIAGNOSIS — D539 Nutritional anemia, unspecified: Secondary | ICD-10-CM | POA: Diagnosis present

## 2023-09-24 DIAGNOSIS — Z811 Family history of alcohol abuse and dependence: Secondary | ICD-10-CM

## 2023-09-24 DIAGNOSIS — F10930 Alcohol use, unspecified with withdrawal, uncomplicated: Secondary | ICD-10-CM | POA: Insufficient documentation

## 2023-09-24 DIAGNOSIS — Z79899 Other long term (current) drug therapy: Secondary | ICD-10-CM

## 2023-09-24 DIAGNOSIS — Z87898 Personal history of other specified conditions: Secondary | ICD-10-CM

## 2023-09-24 DIAGNOSIS — R64 Cachexia: Secondary | ICD-10-CM | POA: Diagnosis present

## 2023-09-24 DIAGNOSIS — B20 Human immunodeficiency virus [HIV] disease: Secondary | ICD-10-CM | POA: Diagnosis present

## 2023-09-24 DIAGNOSIS — K700 Alcoholic fatty liver: Secondary | ICD-10-CM | POA: Diagnosis present

## 2023-09-24 NOTE — ED Triage Notes (Addendum)
Pt to ED by EMS with c/o abdominal pain for the past 12 hours. Denies any NVD. Arrives A+O, VSS, NADN. Hx of pancreatitis, states he has been drinking all day today.

## 2023-09-25 ENCOUNTER — Emergency Department (HOSPITAL_COMMUNITY): Payer: MEDICAID

## 2023-09-25 DIAGNOSIS — F209 Schizophrenia, unspecified: Secondary | ICD-10-CM | POA: Diagnosis present

## 2023-09-25 DIAGNOSIS — B20 Human immunodeficiency virus [HIV] disease: Secondary | ICD-10-CM | POA: Diagnosis present

## 2023-09-25 DIAGNOSIS — Z800 Family history of malignant neoplasm of digestive organs: Secondary | ICD-10-CM | POA: Diagnosis not present

## 2023-09-25 DIAGNOSIS — F10230 Alcohol dependence with withdrawal, uncomplicated: Secondary | ICD-10-CM | POA: Diagnosis present

## 2023-09-25 DIAGNOSIS — Z888 Allergy status to other drugs, medicaments and biological substances status: Secondary | ICD-10-CM | POA: Diagnosis not present

## 2023-09-25 DIAGNOSIS — Y908 Blood alcohol level of 240 mg/100 ml or more: Secondary | ICD-10-CM | POA: Diagnosis present

## 2023-09-25 DIAGNOSIS — K700 Alcoholic fatty liver: Secondary | ICD-10-CM | POA: Diagnosis present

## 2023-09-25 DIAGNOSIS — G40909 Epilepsy, unspecified, not intractable, without status epilepticus: Secondary | ICD-10-CM | POA: Diagnosis present

## 2023-09-25 DIAGNOSIS — Z811 Family history of alcohol abuse and dependence: Secondary | ICD-10-CM | POA: Diagnosis not present

## 2023-09-25 DIAGNOSIS — F17210 Nicotine dependence, cigarettes, uncomplicated: Secondary | ICD-10-CM | POA: Diagnosis present

## 2023-09-25 DIAGNOSIS — F4310 Post-traumatic stress disorder, unspecified: Secondary | ICD-10-CM | POA: Diagnosis present

## 2023-09-25 DIAGNOSIS — T50996A Underdosing of other drugs, medicaments and biological substances, initial encounter: Secondary | ICD-10-CM | POA: Diagnosis present

## 2023-09-25 DIAGNOSIS — R109 Unspecified abdominal pain: Secondary | ICD-10-CM | POA: Diagnosis present

## 2023-09-25 DIAGNOSIS — F10930 Alcohol use, unspecified with withdrawal, uncomplicated: Secondary | ICD-10-CM | POA: Diagnosis not present

## 2023-09-25 DIAGNOSIS — R64 Cachexia: Secondary | ICD-10-CM | POA: Diagnosis present

## 2023-09-25 DIAGNOSIS — Z5900 Homelessness unspecified: Secondary | ICD-10-CM | POA: Diagnosis not present

## 2023-09-25 DIAGNOSIS — E876 Hypokalemia: Secondary | ICD-10-CM | POA: Diagnosis present

## 2023-09-25 DIAGNOSIS — Y906 Blood alcohol level of 120-199 mg/100 ml: Secondary | ICD-10-CM | POA: Diagnosis not present

## 2023-09-25 DIAGNOSIS — K852 Alcohol induced acute pancreatitis without necrosis or infection: Secondary | ICD-10-CM

## 2023-09-25 DIAGNOSIS — D539 Nutritional anemia, unspecified: Secondary | ICD-10-CM | POA: Diagnosis present

## 2023-09-25 DIAGNOSIS — F3181 Bipolar II disorder: Secondary | ICD-10-CM | POA: Diagnosis present

## 2023-09-25 DIAGNOSIS — Z79899 Other long term (current) drug therapy: Secondary | ICD-10-CM | POA: Diagnosis not present

## 2023-09-25 DIAGNOSIS — Z91128 Patient's intentional underdosing of medication regimen for other reason: Secondary | ICD-10-CM | POA: Diagnosis not present

## 2023-09-25 DIAGNOSIS — R1110 Vomiting, unspecified: Secondary | ICD-10-CM | POA: Diagnosis not present

## 2023-09-25 DIAGNOSIS — Z9109 Other allergy status, other than to drugs and biological substances: Secondary | ICD-10-CM | POA: Diagnosis not present

## 2023-09-25 DIAGNOSIS — D7589 Other specified diseases of blood and blood-forming organs: Secondary | ICD-10-CM | POA: Diagnosis present

## 2023-09-25 DIAGNOSIS — K760 Fatty (change of) liver, not elsewhere classified: Secondary | ICD-10-CM | POA: Diagnosis present

## 2023-09-25 LAB — GLUCOSE, CAPILLARY
Glucose-Capillary: 102 mg/dL — ABNORMAL HIGH (ref 70–99)
Glucose-Capillary: 126 mg/dL — ABNORMAL HIGH (ref 70–99)
Glucose-Capillary: 131 mg/dL — ABNORMAL HIGH (ref 70–99)
Glucose-Capillary: 65 mg/dL — ABNORMAL LOW (ref 70–99)
Glucose-Capillary: 72 mg/dL (ref 70–99)

## 2023-09-25 LAB — URINALYSIS, ROUTINE W REFLEX MICROSCOPIC
Bilirubin Urine: NEGATIVE
Glucose, UA: NEGATIVE mg/dL
Hgb urine dipstick: NEGATIVE
Ketones, ur: NEGATIVE mg/dL
Leukocytes,Ua: NEGATIVE
Nitrite: NEGATIVE
Protein, ur: NEGATIVE mg/dL
Specific Gravity, Urine: 1.027 (ref 1.005–1.030)
pH: 6 (ref 5.0–8.0)

## 2023-09-25 LAB — COMPREHENSIVE METABOLIC PANEL
ALT: 60 U/L — ABNORMAL HIGH (ref 0–44)
AST: 62 U/L — ABNORMAL HIGH (ref 15–41)
Albumin: 3.8 g/dL (ref 3.5–5.0)
Alkaline Phosphatase: 102 U/L (ref 38–126)
Anion gap: 11 (ref 5–15)
BUN: 8 mg/dL (ref 6–20)
CO2: 23 mmol/L (ref 22–32)
Calcium: 8.7 mg/dL — ABNORMAL LOW (ref 8.9–10.3)
Chloride: 104 mmol/L (ref 98–111)
Creatinine, Ser: 0.52 mg/dL — ABNORMAL LOW (ref 0.61–1.24)
GFR, Estimated: >60 mL/min (ref >60)
Glucose, Bld: 108 mg/dL — ABNORMAL HIGH (ref 70–99)
Potassium: 2.7 mmol/L — CL (ref 3.5–5.1)
Sodium: 138 mmol/L (ref 135–145)
Total Bilirubin: 0.4 mg/dL (ref <1.2)
Total Protein: 7.6 g/dL (ref 6.5–8.1)

## 2023-09-25 LAB — CBC
HCT: 43.6 % (ref 39.0–52.0)
Hemoglobin: 13.7 g/dL (ref 13.0–17.0)
MCH: 33 pg (ref 26.0–34.0)
MCHC: 31.4 g/dL (ref 30.0–36.0)
MCV: 105.1 fL — ABNORMAL HIGH (ref 80.0–100.0)
Platelets: 390 10*3/uL (ref 150–400)
RBC: 4.15 MIL/uL — ABNORMAL LOW (ref 4.22–5.81)
RDW: 15.3 % (ref 11.5–15.5)
WBC: 4.8 10*3/uL (ref 4.0–10.5)
nRBC: 0 % (ref 0.0–0.2)

## 2023-09-25 LAB — LIPASE, BLOOD: Lipase: 625 U/L — ABNORMAL HIGH (ref 11–51)

## 2023-09-25 LAB — RAPID URINE DRUG SCREEN, HOSP PERFORMED
Amphetamines: NOT DETECTED
Barbiturates: POSITIVE — AB
Benzodiazepines: POSITIVE — AB
Cocaine: NOT DETECTED
Opiates: POSITIVE — AB
Tetrahydrocannabinol: NOT DETECTED

## 2023-09-25 LAB — MAGNESIUM: Magnesium: 2.4 mg/dL (ref 1.7–2.4)

## 2023-09-25 LAB — PHOSPHORUS: Phosphorus: 4.3 mg/dL (ref 2.5–4.6)

## 2023-09-25 LAB — ETHANOL: Alcohol, Ethyl (B): 296 mg/dL — ABNORMAL HIGH (ref <10)

## 2023-09-25 MED ORDER — LORAZEPAM 2 MG/ML IJ SOLN
1.0000 mg | INTRAMUSCULAR | Status: AC | PRN
  Administered 2023-09-25 (×2): 1 mg via INTRAVENOUS
  Administered 2023-09-25: 4 mg via INTRAVENOUS
  Administered 2023-09-25: 1 mg via INTRAVENOUS
  Administered 2023-09-25: 3 mg via INTRAVENOUS
  Administered 2023-09-25 (×2): 4 mg via INTRAVENOUS
  Administered 2023-09-26: 2 mg via INTRAVENOUS
  Administered 2023-09-26: 4 mg via INTRAVENOUS
  Administered 2023-09-26: 3 mg via INTRAVENOUS
  Administered 2023-09-26: 2 mg via INTRAVENOUS
  Administered 2023-09-26: 3 mg via INTRAVENOUS
  Administered 2023-09-26: 2 mg via INTRAVENOUS
  Administered 2023-09-26: 3 mg via INTRAVENOUS
  Administered 2023-09-27: 2 mg via INTRAVENOUS
  Administered 2023-09-27: 3 mg via INTRAVENOUS
  Administered 2023-09-27 (×2): 2 mg via INTRAVENOUS
  Filled 2023-09-25 (×3): qty 2
  Filled 2023-09-25: qty 1
  Filled 2023-09-25 (×2): qty 2
  Filled 2023-09-25: qty 1
  Filled 2023-09-25 (×3): qty 2
  Filled 2023-09-25 (×2): qty 1
  Filled 2023-09-25: qty 2
  Filled 2023-09-25 (×2): qty 1
  Filled 2023-09-25: qty 2
  Filled 2023-09-25 (×2): qty 1

## 2023-09-25 MED ORDER — MAGNESIUM SULFATE 2 GM/50ML IV SOLN
2.0000 g | Freq: Once | INTRAVENOUS | Status: AC
  Administered 2023-09-25: 2 g via INTRAVENOUS
  Filled 2023-09-25: qty 50

## 2023-09-25 MED ORDER — HYDROMORPHONE HCL 1 MG/ML IJ SOLN
0.5000 mg | INTRAMUSCULAR | Status: DC | PRN
  Administered 2023-09-25 – 2023-09-26 (×5): 0.5 mg via INTRAVENOUS
  Filled 2023-09-25 (×3): qty 1
  Filled 2023-09-25: qty 0.5
  Filled 2023-09-25: qty 1
  Filled 2023-09-25: qty 0.5

## 2023-09-25 MED ORDER — POTASSIUM CHLORIDE 10 MEQ/100ML IV SOLN
10.0000 meq | INTRAVENOUS | Status: AC
  Administered 2023-09-25 (×2): 10 meq via INTRAVENOUS
  Filled 2023-09-25 (×2): qty 100

## 2023-09-25 MED ORDER — CHLORHEXIDINE GLUCONATE CLOTH 2 % EX PADS
6.0000 | MEDICATED_PAD | Freq: Every day | CUTANEOUS | Status: DC
  Administered 2023-09-25 – 2023-09-29 (×4): 6 via TOPICAL

## 2023-09-25 MED ORDER — NICOTINE 14 MG/24HR TD PT24
14.0000 mg | MEDICATED_PATCH | Freq: Every day | TRANSDERMAL | Status: DC | PRN
  Administered 2023-09-29: 14 mg via TRANSDERMAL
  Filled 2023-09-25: qty 1

## 2023-09-25 MED ORDER — ADULT MULTIVITAMIN W/MINERALS CH
1.0000 | ORAL_TABLET | Freq: Every day | ORAL | Status: DC
  Administered 2023-09-25 – 2023-09-30 (×6): 1 via ORAL
  Filled 2023-09-25 (×6): qty 1

## 2023-09-25 MED ORDER — ORAL CARE MOUTH RINSE: 15.0000 mL | OROMUCOSAL | Status: DC | PRN

## 2023-09-25 MED ORDER — POTASSIUM CHLORIDE 10 MEQ/100ML IV SOLN
10.0000 meq | INTRAVENOUS | Status: AC
Start: 2023-09-25 — End: 2023-09-25
  Administered 2023-09-25 (×2): 10 meq via INTRAVENOUS
  Filled 2023-09-25 (×2): qty 100

## 2023-09-25 MED ORDER — PHENOBARBITAL 32.4 MG PO TABS
64.8000 mg | ORAL_TABLET | Freq: Two times a day (BID) | ORAL | Status: AC
  Administered 2023-09-25 – 2023-09-26 (×2): 64.8 mg via ORAL
  Filled 2023-09-25 (×2): qty 2

## 2023-09-25 MED ORDER — POTASSIUM CHLORIDE IN NACL 40-0.9 MEQ/L-% IV SOLN
INTRAVENOUS | Status: DC
  Filled 2023-09-25: qty 1000

## 2023-09-25 MED ORDER — PHENOBARBITAL 32.4 MG PO TABS
16.2000 mg | ORAL_TABLET | Freq: Two times a day (BID) | ORAL | Status: AC
  Administered 2023-09-27 – 2023-09-28 (×2): 16.2 mg via ORAL
  Filled 2023-09-25 (×2): qty 1

## 2023-09-25 MED ORDER — DEXTROSE 50 % IV SOLN
25.0000 g | INTRAVENOUS | Status: DC | PRN
  Administered 2023-09-25: 25 g via INTRAVENOUS
  Filled 2023-09-25: qty 50

## 2023-09-25 MED ORDER — LEVETIRACETAM 500 MG PO TABS
500.0000 mg | ORAL_TABLET | Freq: Two times a day (BID) | ORAL | Status: DC
  Administered 2023-09-25 – 2023-09-30 (×11): 500 mg via ORAL
  Filled 2023-09-25 (×11): qty 1

## 2023-09-25 MED ORDER — FOLIC ACID 1 MG PO TABS
1.0000 mg | ORAL_TABLET | Freq: Every day | ORAL | Status: DC
  Administered 2023-09-25 – 2023-09-30 (×6): 1 mg via ORAL
  Filled 2023-09-25 (×6): qty 1

## 2023-09-25 MED ORDER — THIAMINE MONONITRATE 100 MG PO TABS
100.0000 mg | ORAL_TABLET | Freq: Every day | ORAL | Status: DC
Start: 2023-09-25 — End: 2023-09-30
  Administered 2023-09-27 – 2023-09-30 (×4): 100 mg via ORAL
  Filled 2023-09-25 (×4): qty 1

## 2023-09-25 MED ORDER — ACETAMINOPHEN 325 MG PO TABS: 650.0000 mg | ORAL_TABLET | Freq: Four times a day (QID) | ORAL | Status: DC | PRN

## 2023-09-25 MED ORDER — DIPHENHYDRAMINE HCL 50 MG/ML IJ SOLN
INTRAMUSCULAR | Status: AC
  Administered 2023-09-25: 25 mg via INTRAVENOUS
  Filled 2023-09-25: qty 1

## 2023-09-25 MED ORDER — POTASSIUM CHLORIDE IN NACL 40-0.9 MEQ/L-% IV SOLN: INTRAVENOUS | Status: DC

## 2023-09-25 MED ORDER — DEXTROSE 10 % IV SOLN: INTRAVENOUS | Status: DC

## 2023-09-25 MED ORDER — ONDANSETRON HCL 4 MG/2ML IJ SOLN
4.0000 mg | Freq: Once | INTRAMUSCULAR | Status: AC
  Administered 2023-09-25: 4 mg via INTRAVENOUS
  Filled 2023-09-25: qty 2

## 2023-09-25 MED ORDER — LACTATED RINGERS IV SOLN: INTRAVENOUS | Status: DC

## 2023-09-25 MED ORDER — ACETAMINOPHEN 650 MG RE SUPP: 650.0000 mg | Freq: Four times a day (QID) | RECTAL | Status: DC | PRN

## 2023-09-25 MED ORDER — NALOXONE HCL 0.4 MG/ML IJ SOLN: 0.4000 mg | INTRAMUSCULAR | Status: DC | PRN

## 2023-09-25 MED ORDER — PHENOBARBITAL 32.4 MG PO TABS
32.4000 mg | ORAL_TABLET | Freq: Two times a day (BID) | ORAL | Status: AC
  Administered 2023-09-26 – 2023-09-27 (×2): 32.4 mg via ORAL
  Filled 2023-09-25 (×2): qty 1

## 2023-09-25 MED ORDER — LEVETIRACETAM IN NACL 1000 MG/100ML IV SOLN
1000.0000 mg | Freq: Once | INTRAVENOUS | Status: AC
  Administered 2023-09-25: 1000 mg via INTRAVENOUS
  Filled 2023-09-25: qty 100

## 2023-09-25 MED ORDER — MORPHINE SULFATE (PF) 4 MG/ML IV SOLN
4.0000 mg | Freq: Once | INTRAVENOUS | Status: AC
  Administered 2023-09-25: 4 mg via INTRAVENOUS
  Filled 2023-09-25: qty 1

## 2023-09-25 MED ORDER — LACTATED RINGERS IV BOLUS
1000.0000 mL | Freq: Once | INTRAVENOUS | Status: AC
  Administered 2023-09-25: 1000 mL via INTRAVENOUS

## 2023-09-25 MED ORDER — KCL IN DEXTROSE-NACL 20-5-0.9 MEQ/L-%-% IV SOLN
INTRAVENOUS | Status: AC
  Filled 2023-09-25 (×3): qty 1000

## 2023-09-25 MED ORDER — LACTATED RINGERS IV BOLUS
1000.0000 mL | Freq: Once | INTRAVENOUS | Status: AC
Start: 2023-09-25 — End: 2023-09-25
  Administered 2023-09-25: 1000 mL via INTRAVENOUS

## 2023-09-25 MED ORDER — IOHEXOL 300 MG/ML  SOLN
100.0000 mL | Freq: Once | INTRAMUSCULAR | Status: AC | PRN
  Administered 2023-09-25: 100 mL via INTRAVENOUS

## 2023-09-25 MED ORDER — ONDANSETRON HCL 4 MG/2ML IJ SOLN
4.0000 mg | Freq: Four times a day (QID) | INTRAMUSCULAR | Status: DC | PRN
  Administered 2023-09-25 – 2023-09-30 (×6): 4 mg via INTRAVENOUS
  Filled 2023-09-25 (×6): qty 2

## 2023-09-25 MED ORDER — PANTOPRAZOLE SODIUM 40 MG IV SOLR
40.0000 mg | INTRAVENOUS | Status: DC
  Administered 2023-09-25 – 2023-09-29 (×5): 40 mg via INTRAVENOUS
  Filled 2023-09-25 (×5): qty 10

## 2023-09-25 MED ORDER — DEXTROSE 50 % IV SOLN
25.0000 g | Freq: Once | INTRAVENOUS | Status: AC
  Administered 2023-09-25: 25 g via INTRAVENOUS
  Filled 2023-09-25: qty 50

## 2023-09-25 MED ORDER — LORAZEPAM 1 MG PO TABS: 1.0000 mg | ORAL_TABLET | ORAL | Status: AC | PRN

## 2023-09-25 MED ORDER — THIAMINE HCL 100 MG/ML IJ SOLN
100.0000 mg | Freq: Every day | INTRAMUSCULAR | Status: DC
  Administered 2023-09-25: 200 mg via INTRAVENOUS
  Administered 2023-09-26: 100 mg via INTRAVENOUS
  Filled 2023-09-25 (×2): qty 2

## 2023-09-25 NOTE — ED Notes (Signed)
Dr. Robb Matar aware of pts VS and pt medicated per provider orders. Pt is awake, alert and answering questions approprietly. See VS documentation and MAR for interventions

## 2023-09-25 NOTE — H&P (Signed)
History and Physical    Patient: Gary [name] FAO:[id number] DOB: 1986/05/09 DOA: 09/24/2023 DOS: the patient was seen and examined on 09/25/2023 PCP: Patient, No Pcp Per  Patient coming from: Home  Chief Complaint:  Chief Complaint  Patient presents with   Abdominal Pain   HPI: Gary [name] is a 37 y.o. male with medical history significant of alcohol abuse, tobacco abuse, polysubstance abuse, HIV disease not on treatment, history of subdural hematoma, history of seizures, treatment noncompliance, bipolar 2 disorder, PTSD, schizophrenia, alcohol induced pancreatitis who presented to the emergency department with complaints of severe epigastric abdominal pain radiated to his back associated with nausea and multiple episodes of emesis.  He has been drinking half a gallon of vodka daily.  He last drank prior to arriving to the emergency department.  No diarrhea, constipation, melena or hematochezia.  No flank pain, dysuria, frequency or hematuriaHe denied fever, chills, rhinorrhea, sore throat, wheezing or hemoptysis.  No chest pain, palpitations, diaphoresis, PND, orthopnea or pitting edema of the lower extremities. .  No polyuria, polydipsia, polyphagia or blurred vision.   Lab work: UDS is positive for opiates, benzodiazepine and barbiturates.  Urine analysis was normal.  CBC showed a white count of 4.8, hemoglobin 13.7 g/dL with an MCV of 696.2 fL platelets 390.  Lipase is 625 units/L.  Alcohol 296 mg/dL.  CMP showed a sodium of 2.7 mmol/L, the rest of the electrolytes and renal function were unremarkable after calcium correction.  Glucose 108 mg deciliter, AST 62 and ALT 60 units/L, the rest of the hepatic functions were normal.  Imaging: CT abdomen/pelvis with contrast showing continue enlargement of the pancreatic head with surrounding inflammation compatible with acute pancreatitis.  Hepatic steatosis.  ED course: Initial vital signs were temperature 97.5 F, pulse 94, respiration 15,  BP 112/83 mmHg O2 sat 100% on room air.  The patient received 2000 mL of LR bolus, morphine 4 mg IVP x 2, ondansetron 4 mg IVP x 2, KCl 10 mEq IVP x 2.   Review of Systems: As mentioned in the history of present illness. All other systems reviewed and are negative. Past Medical History:  Diagnosis Date   Alcohol abuse    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   Anxiety    Bipolar 2 disorder (HCC)    HIV (human immunodeficiency virus infection) (HCC)    Pancreatitis    PTSD (post-traumatic stress disorder)    Schizophrenia (HCC)    Seizures (HCC)    Subdural hematoma (HCC)    Past Surgical History:  Procedure Laterality Date   BIOPSY  04/19/2022   Procedure: BIOPSY;  Surgeon: Lemar Lofty., MD;  Location: [facility] ENDOSCOPY;  Service: Gastroenterology;;   ENTEROSCOPY N/A 04/19/2022   Procedure: ENTEROSCOPY;  Surgeon: Lemar Lofty., MD;  Location: [facility] ENDOSCOPY;  Service: Gastroenterology;  Laterality: N/A;   INCISION AND DRAINAGE PERIRECTAL ABSCESS N/A 09/24/2016   Procedure: IRRIGATION AND DEBRIDEMENT PERIRECTAL ABSCESS;  Surgeon: Ricarda Frame, MD;  Location: [facility] ORS;  Service: General;  Laterality: N/A;   none     Social History:  reports that he has been smoking cigarettes. He has never used smokeless tobacco. He reports current alcohol use of about 105.0 standard drinks of alcohol per week. He reports current drug use. Drug: Methamphetamines.  Allergies  Allergen Reactions   Tegretol [Carbamazepine] Other (See Comments)    Caused vertigo for 2 days after taking it   Caffeine Palpitations    Family History  Problem Relation Age of Onset  Alcohol abuse Mother    Alcohol abuse Father    Colon cancer Other    Other Other    Cancer Other     Prior to Admission medications   Medication Sig Start Date End Date Taking? Authorizing Provider  bictegravir-emtricitabine-tenofovir AF (BIKTARVY) 50-200-25 MG TABS tablet Take 1 tablet by mouth daily. Patient not  taking: Reported on 09/13/2023    [provider]  folic acid (FOLVITE) 1 MG tablet Take 1 tablet (1 mg total) by mouth daily. Patient not taking: Reported on 09/13/2023 09/02/23 10/02/23  Lanae Boast, MD  levETIRAcetam (KEPPRA) 500 MG tablet Take 1 tablet (500 mg total) by mouth 2 (two) times daily. Patient not taking: Reported on 09/13/2023 08/03/23   Dione Booze, MD  thiamine (VITAMIN B-1) 100 MG tablet Take 1 tablet (100 mg total) by mouth daily. Patient not taking: Reported on 09/13/2023 09/02/23 10/02/23  Lanae Boast, MD  famotidine (PEPCID) 20 MG tablet Take 1 tablet (20 mg total) by mouth 2 (two) times daily. Patient not taking: Reported on 06/01/2019 05/14/19 06/02/19  Benjiman Core, MD    Physical Exam: Vitals:   09/25/23 0455 09/25/23 0455 09/25/23 0606 09/25/23 0742  BP:  115/85 (!) 94/55 (!) 108/56  Pulse:  75 88 70  Resp:    16  Temp: 97.9 F (36.6 C)   98.3 F (36.8 C)  TempSrc: Oral   Oral  SpO2:    97%  Weight:      Height:       Physical Exam Vitals and nursing note reviewed.  Constitutional:      General: He is awake. He is not in acute distress.    Appearance: He is well-developed and normal weight. He is ill-appearing. He is not diaphoretic.  HENT:     Head: Normocephalic.     Nose: No rhinorrhea.     Mouth/Throat:     Mouth: Mucous membranes are dry.  Eyes:     General: No scleral icterus.    Pupils: Pupils are equal, round, and reactive to light.  Cardiovascular:     Rate and Rhythm: Normal rate and regular rhythm.  Pulmonary:     Effort: Pulmonary effort is normal.     Breath sounds: Normal breath sounds.  Abdominal:     General: Bowel sounds are normal. There is no distension.     Palpations: Abdomen is soft.     Tenderness: There is abdominal tenderness in the epigastric area. There is no right CVA tenderness, left CVA tenderness, guarding or rebound.  Musculoskeletal:     Cervical back: Neck supple.     Right lower leg: No edema.      Left lower leg: No edema.  Skin:    General: Skin is warm and dry.  Neurological:     General: No focal deficit present.     Mental Status: He is alert and oriented to person, place, and time.  Psychiatric:        Mood and Affect: Mood normal.        Behavior: Behavior normal. Behavior is cooperative.     Data Reviewed:  Results are pending, will review when available.  Assessment and Plan: Principal Problem:   Acute alcoholic pancreatitis Telemetry/inpatient. Continue IV fluids. Keep n.p.o. for now. Analgesics as needed. Antiemetics as needed. Pantoprazole 40 mg IVP daily. Follow CBC, CMP and lipase in AM.  Active Problems:   Alcohol use disorder, severe, dependence (HCC) CIWA protocol with lorazepam. Magnesium sulfate supplementation. Folate, MVI  and thiamine. Consult TOC team. Alcohol cessation advised.    History of seizures Keppra was resumed. Subsequently loaded with 1000 mg IVPB due to high CIWA score.    Transaminitis with hepatic steatosis Secondary to alcohol abuse.    HIV disease (HCC) Due to noncompliance will not resume antiretrovirals. The patient was asked to follow-up with infectious diseases.    Macrocytosis Secondary to EtOH use.    Hypokalemia Replacing. Magnesium supplemented. Follow-up potassium level.    Tobacco abuse Tobacco cessation advised. Nicotine replacement therapy ordered.     Advance Care Planning:   Code Status: Full Code   Consults:   Family Communication:   Severity of Illness: The appropriate patient status for this patient is INPATIENT. Inpatient status is judged to be reasonable and necessary in order to provide the required intensity of service to ensure the patient's safety. The patient's presenting symptoms, physical exam findings, and initial radiographic and laboratory data in the context of their chronic comorbidities is felt to place them at high risk for further clinical deterioration. Furthermore, it is  not anticipated that the patient will be medically stable for discharge from the hospital within 2 midnights of admission.   * I certify that at the point of admission it is my clinical judgment that the patient will require inpatient hospital care spanning beyond 2 midnights from the point of admission due to high intensity of service, high risk for further deterioration and high frequency of surveillance required.*  Author: Bobette Mo, MD 09/25/2023 7:53 AM  For on call review [email or web address].   This document was prepared using Dragon voice recognition software and may contain some unintended transcription errors.

## 2023-09-25 NOTE — ED Notes (Signed)
ED TO INPATIENT HANDOFF REPORT  Name/Age/Gender Gary [name] 37 y.o. male  Code Status    Code Status Orders  (From admission, onward)           Start     Ordered   09/25/23 0438  Full code  Continuous       Question:  By:  Answer:  Consent: discussion documented in EHR   09/25/23 0437           Code Status History     Date Active Date Inactive Code Status Order ID Comments User Context   08/22/2023 2149 09/01/2023 1905 Full Code [id number]  Charlsie Quest, MD ED   07/16/2023 2225 07/19/2023 0025 Full Code [id number]  Anselm Jungling, DO ED   06/11/2023 0742 06/15/2023 1947 Full Code [id number]  Maryln Gottron, MD ED   05/24/2023 1817 05/29/2023 2224 Full Code [id number]  Lurline Del, MD ED   12/30/2022 2215 12/31/2022 1512 Full Code [id number]  [name], PA-C ED   12/23/2022 0422 12/26/2022 1044 Full Code [id number]  Tim Lair, PA-C ED   12/14/2022 1532 12/22/2022 1941 Full Code [id number]  Teddy Spike, DO ED   11/07/2022 2321 11/08/2022 1522 Full Code [id number]  [name], PA-C ED   10/16/2022 2251 10/23/2022 1712 Full Code [id number]  Charlsie Quest, MD ED   10/16/2022 1707 10/16/2022 1800 Full Code [id number]  Jackelyn Poling, NP Inpatient   09/11/2022 2015 09/13/2022 1644 Full Code [id number]  Belva Agee, MD ED   06/18/2022 2328 06/19/2022 2240 Full Code [id number]  Gilles Chiquito, MD ED   06/11/2022 2314 06/12/2022 1121 Full Code [id number]  Shon Baton, MD ED   05/28/2022 0346 06/01/2022 1913 Full Code [id number]  Carollee Herter, DO ED   05/20/2022 2019 05/21/2022 1727 Full Code [id number]  Ernie Avena, MD ED   04/25/2022 2012 05/03/2022 1757 Full Code [id number]  Darlin Drop, DO ED   04/16/2022 1415 04/23/2022 1911 Full Code [id number]  Clydie Braun, MD ED   04/16/2022 1033 04/16/2022 1415 Full Code [id number]  Jeannie Fend, PA-C ED   01/20/2022 0849 01/27/2022 1949 Full Code [id number]  Clydie Braun, MD ED   01/20/2022 0440 01/20/2022 0849 Full Code [id number]   Carroll Sage, PA-C ED   12/31/2021 0738 01/06/2022 2055 Full Code [id number]  Bobette Mo, MD ED   12/31/2021 0736 12/31/2021 0738 Full Code [id number]  Bobette Mo, MD ED   12/31/2021 0053 12/31/2021 0735 Full Code [id number]  Shon Baton, MD ED   11/06/2021 1540 11/08/2021 2241 Full Code [id number]  Janeann Forehand D, NP ED   11/06/2021 0200 11/06/2021 1540 Full Code [id number]  Paula Libra, MD ED   09/23/2021 0025 09/23/2021 1818 Full Code [id number]  Alvira Monday, MD ED   09/05/2021 2238 09/06/2021 1111 Full Code [id number]  Gerhard Munch, MD ED   09/05/2021 1507 09/05/2021 2238 Full Code [id number]  Samson Frederic, Cortni Kathie Rhodes, PA-C ED   08/26/2021 1556 08/30/2021 1846 Full Code [id number]  Estella Husk, MD ED   08/25/2021 2252 08/26/2021 1545 Full Code [id number]  Elpidio Anis, PA-C ED   07/26/2021 0108 07/31/2021 1948 Full Code [id number]  Loletta Parish, NP Inpatient   07/24/2021 2309 07/26/2021 0045 Full Code [id number]  Henderly, Britni A, PA-C ED   07/11/2021 2129 07/13/2021 1757 Full Code [id number]  Linwood Dibbles, MD ED   07/08/2021 0519 07/08/2021 1134 Full Code [id number]  Horton, Mayer Masker, MD  ED   06/28/2021 2319 06/29/2021 0621 Full Code [id number]  Maia Plan, MD ED   04/30/2021 0905 05/01/2021 1150 Full Code [id number]  Teddy Spike, DO Inpatient   04/23/2021 1201 04/23/2021 2331 Full Code [id number]  Alden Hipp ED   04/07/2021 2151 04/08/2021 1516 Full Code [id number]  Jackelyn Poling, NP ED   04/06/2021 2256 04/07/2021 1108 Full Code [id number]  Linwood Dibbles, MD ED   03/11/2021 1520 03/19/2021 1925 Full Code [id number]  Eliezer Bottom, MD ED   03/08/2021 0701 03/09/2021 1757 Full Code [id number]  Petrucelli, Pleas Koch, PA-C ED   03/06/2021 2129 03/07/2021 0952 Full Code [id number]  Sponseller, Eugene Gavia, PA-C ED   02/24/2021 0116 02/24/2021 1107 Full Code [id number]  Petrucelli, Pleas Koch, PA-C ED   02/13/2021 0041 02/13/2021 1245 Full Code [id number]  Milagros Loll, MD  ED   02/11/2021 1916 02/12/2021 1555 Full Code [id number]  Samson Frederic, Cortni S, PA-C ED   01/11/2021 2100 01/12/2021 1150 Full Code [id number]  Gerhard Munch, MD ED   12/30/2020 1949 12/31/2020 1257 Full Code [id number]  Chesley Noon, MD ED   12/29/2020 2000 12/30/2020 1235 Full Code [id number]  Sharman Cheek, MD ED   12/09/2020 0420 12/09/2020 1534 Full Code [id number]  Eduard Clos, MD ED   12/09/2020 0131 12/09/2020 0420 Full Code [id number]  Roxy Horseman, PA-C ED   12/05/2020 1448 12/08/2020 1301 Full Code [id number]  Leeroy Bock, DO ED   12/05/2020 1200 12/05/2020 1448 Full Code [id number]  Horton, Clabe Seal, DO ED   10/20/2020 0016 11/09/2020 1546 Full Code [id number]  Irean Hong, MD ED   10/19/2020 0345 10/19/2020 2211 Full Code [id number]  Andris Baumann, MD ED   10/18/2020 2122 10/19/2020 0345 Full Code [id number]  Merwyn Katos, MD ED   10/07/2020 1127 10/08/2020 2023 Full Code [id number]  Chesley Noon, MD ED   09/28/2020 0324 09/29/2020 1457 Full Code [id number]  Shaune Pollack, MD ED   09/26/2020 0950 09/27/2020 1521 Full Code [id number]  Minna Antis, MD ED   09/26/2020 0728 09/26/2020 0950 Full Code [id number]  Nita Sickle, MD ED   07/04/2020 1040 07/05/2020 2139 Full Code [id number]  Jerald Kief, MD ED   06/23/2020 2240 06/25/2020 1923 Full Code [id number]  Jeannie Fend, PA-C ED   06/14/2020 0003 06/18/2020 1749 Full Code [id number]  Venora Maples, MD ED   06/09/2020 1108 06/09/2020 2025 Full Code [id number]  Lanae Boast, MD ED   06/08/2020 1548 06/08/2020 1942 Full Code [id number]  Jackelyn Poling, NP Inpatient   06/07/2020 1758 06/08/2020 1543 Full Code [id number]  Clarene Duke, Ambrose Finland, MD ED   05/05/2020 0254 05/09/2020 1715 Full Code [id number]  Hillary Bow, DO ED   04/17/2020 2319 04/19/2020 0306 Full Code [id number]  Charlsie Quest, MD ED   04/17/2020 1915 04/17/2020 2319 Full Code [id number]  Henderly, Britni A, PA-C ED   07/25/2019 1008 07/30/2019 2058 Full Code [id number]   Clydie Braun, MD ED   07/02/2019 0905 07/03/2019 1445 Full Code [id number]  Clydie Braun, MD ED   05/12/2019 1839 05/13/2019 1500 Full Code [id number]  Fayrene Helper, PA-C ED   04/16/2019 1809 04/17/2019 1841 Full Code [id number]  Charm Rings, NP Inpatient   04/16/2019 1238 04/16/2019 1710 Full Code [id number]  Jeanmarie Plant, MD ED   03/31/2019 0028 03/31/2019 1449 Full Code [id number]  Arnaldo Natal, MD ED   06/15/2018 2015 06/19/2018 1742 Full Code [id number]  Enid Baas, MD Inpatient   09/24/2016 0429 09/25/2016 1406 Full Code [id number]  Arnaldo Natal Inpatient       Home/SNF/Other Home  Chief Complaint Acute alcoholic pancreatitis [K85.20]  Level of Care/Admitting Diagnosis ED Disposition     ED Disposition  Admit   Condition  --   Comment  Hospital Area: [facility] [[id number]]  Level of Care: Telemetry [5]  Admit to tele based on following criteria: Monitor for Ischemic changes  May admit patient to [name] or [facility] if equivalent level of care is available:: No  Covid Evaluation: Asymptomatic - no recent exposure (last 10 days) testing not required  Diagnosis: Acute alcoholic pancreatitis [409811]  Admitting Physician: Angie Fava [[id number]]  Attending Physician: Angie Fava [[id number]]  Certification:: I certify this patient will need inpatient services for at least 2 midnights  Expected Medical Readiness: 09/27/2023          Medical History Past Medical History:  Diagnosis Date   Alcohol abuse    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   Anxiety    Bipolar 2 disorder (HCC)    HIV (human immunodeficiency virus infection) (HCC)    Pancreatitis    PTSD (post-traumatic stress disorder)    Schizophrenia (HCC)    Seizures (HCC)    Subdural hematoma (HCC)     Allergies Allergies  Allergen Reactions   Tegretol [Carbamazepine] Other (See Comments)    Caused vertigo for 2 days after taking it   Caffeine Palpitations     IV Location/Drains/Wounds Patient Lines/Drains/Airways Status     Active Line/Drains/Airways     Name Placement date Placement time Site Days   Peripheral IV 09/25/23 22 G Anterior;Right Hand 09/25/23  0045  Hand  less than 1   Peripheral IV 09/25/23 20 G 1" Anterior;Distal;Left;Upper Forearm 09/25/23  0205  Forearm  less than 1            Labs/Imaging Results for orders placed or performed during the hospital encounter of 09/24/23 (from the past 48 hour(s))  Lipase, blood     Status: Abnormal   Collection Time: 09/25/23 12:03 AM  Result Value Ref Range   Lipase 625 (H) 11 - 51 U/L    Comment: RESULT CONFIRMED BY MANUAL DILUTION Performed at [facility], [address], [place], [place] [address]   Comprehensive metabolic panel     Status: Abnormal   Collection Time: 09/25/23 12:03 AM  Result Value Ref Range   Sodium 138 135 - 145 mmol/L   Potassium 2.7 (LL) 3.5 - 5.1 mmol/L    Comment: CRITICAL RESULT CALLED TO, READ BACK BY AND VERIFIED WITH KOCH, C RN @ 0120 09/25/23. GILBERTL    Chloride 104 98 - 111 mmol/L   CO2 23 22 - 32 mmol/L   Glucose, Bld 108 (H) 70 - 99 mg/dL    Comment: Glucose reference range applies only to samples taken after fasting for at least 8 hours.   BUN 8 6 - 20 mg/dL   Creatinine, Ser 6.96 (L) 0.61 - 1.24 mg/dL   Calcium 8.7 (L) 8.9 - 10.3 mg/dL   Total Protein 7.6 6.5 - 8.1 g/dL   Albumin 3.8 3.5 - 5.0 g/dL   AST 62 (H) 15 - 41 U/L   ALT 60 (H) 0 - 44 U/L   Alkaline Phosphatase 102 38 - 126 U/L   Total Bilirubin 0.4 <1.2 mg/dL   GFR, Estimated >29 >52 mL/min    Comment: (NOTE) Calculated  using the CKD-EPI Creatinine Equation (2021)    Anion gap 11 5 - 15    Comment: Performed at [facility], [address] [place], [place] [address]  CBC     Status: Abnormal   Collection Time: 09/25/23 12:03 AM  Result Value Ref Range   WBC 4.8 4.0 - 10.5 K/uL   RBC 4.15 (L) 4.22 - 5.81 MIL/uL   Hemoglobin  13.7 13.0 - 17.0 g/dL   HCT 56.2 13.0 - 86.5 %   MCV 105.1 (H) 80.0 - 100.0 fL   MCH 33.0 26.0 - 34.0 pg   MCHC 31.4 30.0 - 36.0 g/dL   RDW 78.4 69.6 - 29.5 %   Platelets 390 150 - 400 K/uL   nRBC 0.0 0.0 - 0.2 %    Comment: Performed at [facility], [address], [place], [place] [address]  Urinalysis, Routine w reflex microscopic -Urine, Clean Catch     Status: None   Collection Time: 09/25/23 12:03 AM  Result Value Ref Range   Color, Urine YELLOW YELLOW   APPearance CLEAR CLEAR   Specific Gravity, Urine 1.027 1.005 - 1.030   pH 6.0 5.0 - 8.0   Glucose, UA NEGATIVE NEGATIVE mg/dL   Hgb urine dipstick NEGATIVE NEGATIVE   Bilirubin Urine NEGATIVE NEGATIVE   Ketones, ur NEGATIVE NEGATIVE mg/dL   Protein, ur NEGATIVE NEGATIVE mg/dL   Nitrite NEGATIVE NEGATIVE   Leukocytes,Ua NEGATIVE NEGATIVE    Comment: Performed at [facility], [address], [place], [place] [address]  Magnesium     Status: None   Collection Time: 09/25/23 12:03 AM  Result Value Ref Range   Magnesium 2.4 1.7 - 2.4 mg/dL    Comment: Performed at [facility], [address], [place], [place] [address]  Phosphorus     Status: None   Collection Time: 09/25/23 12:03 AM  Result Value Ref Range   Phosphorus 4.3 2.5 - 4.6 mg/dL    Comment: Performed at [facility], [address], [place], [place] [address]  Ethanol     Status: Abnormal   Collection Time: 09/25/23  2:48 AM  Result Value Ref Range   Alcohol, Ethyl (B) 296 (H) <10 mg/dL    Comment: (NOTE) Lowest detectable limit for serum alcohol is 10 mg/dL.  For medical purposes only. Performed at [facility], [address], [place], [place] [address]    CT ABDOMEN PELVIS W CONTRAST  Result Date: 09/25/2023 CLINICAL DATA:  Epigastric pain EXAM: CT ABDOMEN AND PELVIS WITH CONTRAST TECHNIQUE: Multidetector CT imaging of the abdomen and pelvis was performed  using the standard protocol following bolus administration of intravenous contrast. RADIATION DOSE REDUCTION: This exam was performed according to the departmental dose-optimization program which includes automated exposure control, adjustment of the mA and/or kV according to patient size and/or use of iterative reconstruction technique. CONTRAST:  OMNIPAQUE IOHEXOL 300 MG/ML  SOLN COMPARISON:  09/16/2023 FINDINGS: Lower chest: Elevation of the left hemidiaphragm, stable. No acute findings. Hepatobiliary: Diffuse low-density throughout the liver compatible with fatty infiltration. No focal abnormality. Gallbladder unremarkable. Common bile duct prominent at 8 mm, similar to prior study. Pancreas: Truncation of the pancreatic tail. Enlargement of the pancreatic head with surrounding peripancreatic edema. Findings are similar to prior study. Spleen: No focal abnormality.  Normal size. Adrenals/Urinary Tract: No adrenal abnormality. No focal renal abnormality. No stones or hydronephrosis. Urinary bladder is unremarkable. Stomach/Bowel: Normal appendix. Stomach, large and  small bowel grossly unremarkable. Vascular/Lymphatic: No evidence of aneurysm or adenopathy. Reproductive: No visible focal abnormality. Other: No free fluid or free air. Musculoskeletal: No acute bony abnormality IMPRESSION: Continued enlargement of the pancreatic head with surrounding inflammation compatible with acute pancreatitis Hepatic steatosis. Electronically Signed   By: Charlett Nose M.D.   On: 09/25/2023 02:34    Pending Labs Unresulted Labs (From admission, onward)     Start     Ordered   09/26/23 0500  CBC  Daily,   R      09/25/23 0907   09/26/23 0500  Lipase, blood  Daily,   R      09/25/23 0907   09/26/23 0500  Comprehensive metabolic panel  Daily,   R      09/25/23 0907   09/25/23 1034  Rapid urine drug screen (hospital performed)  ONCE - STAT,   STAT        09/25/23 1033            Vitals/Pain Today's Vitals    09/25/23 1045 09/25/23 1111 09/25/23 1137 09/25/23 1305  BP: 100/68 (!) 90/54  93/66  Pulse: 88 86  88  Resp: (!) 21     Temp:  98.1 F (36.7 C)  98.1 F (36.7 C)  TempSrc:    Oral  SpO2: (!) 87% 97%  98%  Weight:      Height:      PainSc:   5      Isolation Precautions No active isolations  Medications Medications  acetaminophen (TYLENOL) tablet 650 mg (has no administration in time range)    Or  acetaminophen (TYLENOL) suppository 650 mg (has no administration in time range)  ondansetron (ZOFRAN) injection 4 mg (4 mg Intravenous Given 09/25/23 1131)  naloxone (NARCAN) injection 0.4 mg (has no administration in time range)  HYDROmorphone (DILAUDID) injection 0.5 mg (has no administration in time range)  LORazepam (ATIVAN) tablet 1-4 mg ( Oral See Alternative 09/25/23 1146)    Or  LORazepam (ATIVAN) injection 1-4 mg (1 mg Intravenous Given 09/25/23 1146)  thiamine (VITAMIN B1) tablet 100 mg ( Oral See Alternative 09/25/23 0502)    Or  thiamine (VITAMIN B1) injection 100 mg (200 mg Intravenous Given by Other 09/25/23 0502)  folic acid (FOLVITE) tablet 1 mg (1 mg Oral Given 09/25/23 1022)  multivitamin with minerals tablet 1 tablet (1 tablet Oral Given 09/25/23 1022)  0.9 % NaCl with KCl 40 mEq / L  infusion (0 mL/hr Intravenous Stopped 09/25/23 1127)  pantoprazole (PROTONIX) injection 40 mg (40 mg Intravenous Given 09/25/23 0837)  levETIRAcetam (KEPPRA) tablet 500 mg (500 mg Oral Given 09/25/23 1107)  lactated ringers bolus 1,000 mL (0 mLs Intravenous Stopped 09/25/23 0417)  morphine (PF) 4 MG/ML injection 4 mg (4 mg Intravenous Given 09/25/23 0151)  ondansetron (ZOFRAN) injection 4 mg (4 mg Intravenous Given 09/25/23 0147)  potassium chloride 10 mEq in 100 mL IVPB (0 mEq Intravenous Stopped 09/25/23 0418)  magnesium sulfate IVPB 2 g 50 mL (0 g Intravenous Stopped 09/25/23 0326)  diphenhydrAMINE (BENADRYL) 50 MG/ML injection (25 mg Intravenous Given 09/25/23 0200)  iohexol  (OMNIPAQUE) 300 MG/ML solution 100 mL (100 mLs Intravenous Contrast Given 09/25/23 0202)  morphine (PF) 4 MG/ML injection 4 mg (4 mg Intravenous Given 09/25/23 0433)  ondansetron (ZOFRAN) injection 4 mg (4 mg Intravenous Given 09/25/23 0433)  potassium chloride 10 mEq in 100 mL IVPB (0 mEq Intravenous Stopped 09/25/23 1302)  lactated ringers bolus 1,000 mL (0 mLs Intravenous Stopped  09/25/23 1303)    Mobility walks

## 2023-09-25 NOTE — Progress Notes (Signed)
  Carryover admission to the Day Admitter.  I discussed this case with the EDP, Dr. Posey Rea.  Per these discussions:   This is a 37 year old male with history of chronic alcohol abuse, recurrent acute alcoholic pancreatitis, who is being admitted with acute alcoholic pancreatitis after presenting with 1 day of worsening epigastric discomfort associated with nausea/vomiting, with presenting lipase elevated into the 600s, relative to most recent prior value of 220 on 09/12/2023, with CT abdomen/pelvis showing evidence of acute pancreatitis.  He has a history of chronic alcohol abuse, consuming half a gallon per day of vodka, with most recent alcohol consumed just prior to presenting to the emergency department this evening.  Presenting serum ethanol level 296.  He reportedly has a history of alcohol withdrawal seizures.  I have placed an order for inpatient admission to med/tele for further evaluation and management of the above.  I have placed some additional preliminary admit orders via the adult multi-morbid admission order set. I have also ordered n.p.o., prn IV Zofran, as needed IV Dilaudid, lactated Ringer's at 150 cc/h x 24 hours.  Have added on serum magnesium and phosphorus levels.  Have initiated CIWA protocol with as needed IV Ativan, ordered transition of care consult for his chronic alcohol abuse, and also ordered daily thiamine, folic acid and multivitamin supplementation, with first dose of IV thiamine supplementation to occur now.  Also added seizure precautions given his history of alcohol withdrawal seizures.    Newton Pigg, DO Hospitalist

## 2023-09-25 NOTE — Plan of Care (Signed)
Patient just admitted and has required copious medications to prevent withdrawal. Goals for discharge or transfer not currently met.

## 2023-09-25 NOTE — ED Provider Notes (Signed)
[place] EMERGENCY DEPARTMENT AT [facility] Provider Note  CSN: [id number] Arrival date & time: 09/24/23 2329  Chief Complaint(s) Abdominal Pain  HPI Gary [name] is a 37 y.o. male with PMH alcohol use disorder with history of withdrawal seizures, polysubstance abuse, recurrent alcohol-induced pancreatitis, HIV, PTSD, anxiety, bipolar disorder, homelessness who presents emergency department for evaluation of epigastric abdominal pain nausea and vomiting.  Patient states that he drinks half a gallon of vodka daily and last drink was this morning.  Denies chest pain, shortness of breath, headache, fever or other systemic symptoms.   Past Medical History Past Medical History:  Diagnosis Date   Alcohol abuse    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   Anxiety    Bipolar 2 disorder (HCC)    HIV (human immunodeficiency virus infection) (HCC)    Pancreatitis    PTSD (post-traumatic stress disorder)    Schizophrenia (HCC)    Seizures (HCC)    Subdural hematoma (HCC)    Patient Active Problem List   Diagnosis Date Noted   Protein-calorie malnutrition, severe 12/25/2022   Hypomagnesemia 12/24/2022   Alcohol withdrawal (HCC) 12/23/2022   Seizure disorder (HCC) 12/17/2022   Pancytopenia (HCC) 12/14/2022   Alcohol-induced pancreatitis 12/14/2022   Delirium tremens (HCC) 10/19/2022   Alcohol abuse with alcohol-induced mood disorder (HCC) 10/16/2022   Alcohol dependence with withdrawal (HCC) 10/16/2022   Abnormal LFTs 10/16/2022   Acute alcoholic pancreatitis 09/11/2022   AKI (acute kidney injury) (HCC) 05/28/2022   Homelessness 05/28/2022   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   Metabolic acidosis with increased anion gap and accumulation of organic acids 04/16/2022   History of seizures 04/16/2022   Tobacco abuse 04/16/2022   Hypokalemia 01/22/2022   Macrocytosis 12/31/2021   Alcohol-induced insomnia (HCC)    MDD (major depressive disorder), recurrent episode, severe (HCC)  07/26/2021   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   Alcohol abuse    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   Malnutrition of moderate degree 05/07/2020   Alcoholic ketoacidosis 05/05/2020   Thrombocytopenia (HCC) 07/25/2019   Transaminitis 07/25/2019   Traumatic subdural hematoma (HCC) 07/02/2019   Alcoholic intoxication with complication (HCC) 07/02/2019   Hypocalcemia 07/02/2019   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 Authorizing Provider  bictegravir-emtricitabine-tenofovir AF (BIKTARVY) 50-200-25 MG TABS tablet Take 1 tablet by mouth daily. Patient not taking: Reported on 09/13/2023    [provider]  folic acid (FOLVITE) 1 MG tablet Take 1 tablet (1 mg total) by mouth daily. Patient not taking: Reported on 09/13/2023 09/02/23 10/02/23  Lanae Boast, MD  levETIRAcetam (KEPPRA) 500 MG tablet Take 1 tablet (500 mg total) by mouth 2 (two) times daily. Patient not taking: Reported on 09/13/2023 08/03/23   Dione Booze, MD  thiamine (VITAMIN B-1) 100 MG tablet Take 1 tablet (100 mg total) by mouth daily. Patient not taking: Reported on 09/13/2023 09/02/23 10/02/23  Lanae Boast, MD  famotidine (PEPCID) 20 MG tablet Take 1 tablet (20 mg total) by mouth 2 (two) times daily. Patient not taking: Reported on 06/01/2019 05/14/19 06/02/19  Benjiman Core, MD  Past Surgical History Past Surgical History:  Procedure Laterality Date   BIOPSY  04/19/2022   Procedure: BIOPSY;  Surgeon: Meridee Score Netty Starring., MD;  Location: [facility] ENDOSCOPY;  Service: Gastroenterology;;   ENTEROSCOPY N/A 04/19/2022   Procedure: ENTEROSCOPY;  Surgeon: Meridee Score Netty Starring., MD;  Location: [facility] ENDOSCOPY;  Service: Gastroenterology;  Laterality: N/A;   INCISION AND DRAINAGE PERIRECTAL ABSCESS N/A 09/24/2016   Procedure: IRRIGATION AND DEBRIDEMENT PERIRECTAL ABSCESS;  Surgeon: Ricarda Frame, MD;  Location: [facility] ORS;  Service: General;  Laterality: N/A;   none     Family History Family History  Problem Relation Age of Onset   Alcohol abuse Mother    Alcohol abuse Father    Colon cancer Other    Other Other    Cancer Other     Social History Social History   Tobacco Use   Smoking status: Every Day    Current packs/day: 0.50    Types: Cigarettes   Smokeless tobacco: Never   Tobacco comments:    unable to smoke while incarcerated 6+ months 02/13/20  Vaping Use   Vaping status: Never Used  Substance Use Topics   Alcohol use: Yes    Alcohol/week: 105.0 standard drinks of alcohol    Types: 105 Cans of beer per week    Comment: drinks every day, all day, "whatever I can get my hands on"   Drug use: Yes    Types: Methamphetamines    Comment: last used 04/10/2019   Allergies Tegretol [carbamazepine] and Caffeine  Review of Systems Review of Systems  Gastrointestinal:  Positive for abdominal pain, nausea and vomiting.    Physical Exam Vital Signs  I have reviewed the triage vital signs BP 101/75   Pulse 89   Temp (!) 97.5 F (36.4 C) (Oral)   Resp 10   Ht 6' (1.829 m)   Wt 68 kg   SpO2 100%   BMI 20.34 kg/m   Physical Exam Constitutional:      General: He is not in acute distress.    Appearance: Normal appearance.  HENT:     Head: Normocephalic and atraumatic.     Nose: No congestion or rhinorrhea.  Eyes:     General:        Right eye: No discharge.        Left eye: No discharge.     Extraocular Movements: Extraocular movements intact.     Pupils: Pupils are equal, round, and  reactive to light.  Cardiovascular:     Rate and Rhythm: Normal rate and regular rhythm.     Heart sounds: No murmur heard. Pulmonary:     Effort: No respiratory distress.     Breath sounds: No wheezing or rales.  Abdominal:     General: There is no distension.     Tenderness: There is abdominal tenderness in the epigastric area.  Musculoskeletal:        General: Normal range of motion.     Cervical back: Normal range of motion.  Skin:    General: Skin is warm and dry.  Neurological:     General: No focal deficit present.     Mental Status: He is alert.     ED Results and Treatments Labs (all labs ordered are listed, but only abnormal results are displayed) Labs Reviewed  LIPASE, BLOOD - Abnormal; Notable for the following components:      Result Value   Lipase 625 (*)    All other components within normal limits  COMPREHENSIVE METABOLIC PANEL - Abnormal; Notable for the following components:   Potassium 2.7 (*)    Glucose, Bld 108 (*)    Creatinine, Ser 0.52 (*)    Calcium 8.7 (*)    AST 62 (*)    ALT 60 (*)    All other components within normal limits  CBC - Abnormal; Notable for the following components:   RBC 4.15 (*)    MCV 105.1 (*)    All other components within normal limits  ETHANOL - Abnormal; Notable for the following components:   Alcohol, Ethyl (B) 296 (*)    All other components within normal limits  URINALYSIS, ROUTINE W REFLEX MICROSCOPIC                                                                                                                          Radiology CT ABDOMEN PELVIS W CONTRAST  Result Date: 09/25/2023 CLINICAL DATA:  Epigastric pain EXAM: CT ABDOMEN AND PELVIS WITH CONTRAST TECHNIQUE: Multidetector CT imaging of the abdomen and pelvis was performed using the standard protocol following bolus administration of intravenous contrast. RADIATION DOSE REDUCTION: This exam was performed according to the departmental dose-optimization  program which includes automated exposure control, adjustment of the mA and/or kV according to patient size and/or use of iterative reconstruction technique. CONTRAST:  OMNIPAQUE IOHEXOL 300 MG/ML  SOLN COMPARISON:  09/16/2023 FINDINGS: Lower chest: Elevation of the left hemidiaphragm, stable. No acute findings. Hepatobiliary: Diffuse low-density throughout the liver compatible with fatty infiltration. No focal abnormality. Gallbladder unremarkable. Common bile duct prominent at 8 mm, similar to prior study. Pancreas: Truncation of the pancreatic tail. Enlargement of the pancreatic head with surrounding peripancreatic edema. Findings are similar to prior study. Spleen: No focal abnormality.  Normal size. Adrenals/Urinary Tract: No adrenal abnormality. No focal renal abnormality. No stones or hydronephrosis. Urinary bladder is unremarkable. Stomach/Bowel: Normal appendix. Stomach, large and small bowel grossly unremarkable. Vascular/Lymphatic: No evidence of aneurysm or adenopathy. Reproductive: No visible focal abnormality. Other: No free fluid or free air. Musculoskeletal: No acute bony abnormality IMPRESSION: Continued enlargement of the pancreatic head with surrounding inflammation compatible with acute pancreatitis Hepatic steatosis. Electronically Signed   By: Charlett Nose M.D.   On: 09/25/2023 02:34    Pertinent labs & imaging results that were available during my care of the patient were reviewed by me and considered in my medical decision making (see MDM for details).  Medications Ordered in ED Medications  potassium chloride 10 mEq in 100 mL IVPB (10 mEq Intravenous New Bag/Given 09/25/23 0320)  lactated ringers bolus 1,000 mL (1,000 mLs Intravenous New Bag/Given 09/25/23 0217)  morphine (PF) 4 MG/ML injection 4 mg (4 mg Intravenous Given 09/25/23 0151)  ondansetron (ZOFRAN) injection 4 mg (4 mg Intravenous Given 09/25/23 0147)  magnesium sulfate IVPB 2 g 50 mL (0 g Intravenous Stopped  09/25/23 0326)  diphenhydrAMINE (BENADRYL) 50 MG/ML injection (25 mg Intravenous Given 09/25/23 0200)  iohexol (OMNIPAQUE) 300 MG/ML solution  100 mL (100 mLs Intravenous Contrast Given 09/25/23 0202)                                                                                                                                     Procedures Procedures  (including critical care time)  Medical Decision Making / ED Course   This patient presents to the ED for concern of abdominal pain, this involves an extensive number of treatment options, and is a complaint that carries with it a high risk of complications and morbidity.  The differential diagnosis includes GERD/gastritis, peptic ulcer disease, pancreatitis, gastroparesis, pneumonia, pleurisy, pericarditis  MDM: Patient seen emergency room for evaluation of abdominal pain.  Physical exam with severe tenderness in the epigastrium, facial flushing likely consistent with his chronic alcohol use but is otherwise unremarkable.  Laboratory evaluation with a potassium of 2.7 which was repleted in the emergency department with IV potassium and magnesium, lipase 625, alcohol 296.  CT abdomen pelvis showing evidence of persistent acute pancreatitis.  Patient pain controlled and will require hospital admission for alcohol induced pancreatitis   Additional history obtained:  -External records from outside source obtained and reviewed including: Chart review including previous notes, labs, imaging, consultation notes   Lab Tests: -I ordered, reviewed, and interpreted labs.   The pertinent results include:   Labs Reviewed  LIPASE, BLOOD - Abnormal; Notable for the following components:      Result Value   Lipase 625 (*)    All other components within normal limits  COMPREHENSIVE METABOLIC PANEL - Abnormal; Notable for the following components:   Potassium 2.7 (*)    Glucose, Bld 108 (*)    Creatinine, Ser 0.52 (*)    Calcium 8.7 (*)    AST 62  (*)    ALT 60 (*)    All other components within normal limits  CBC - Abnormal; Notable for the following components:   RBC 4.15 (*)    MCV 105.1 (*)    All other components within normal limits  ETHANOL - Abnormal; Notable for the following components:   Alcohol, Ethyl (B) 296 (*)    All other components within normal limits  URINALYSIS, ROUTINE W REFLEX MICROSCOPIC      Imaging Studies ordered: I ordered imaging studies including CTAP I independently visualized and interpreted imaging. I agree with the radiologist interpretation   Medicines ordered and prescription drug management: Meds ordered this encounter  Medications   lactated ringers bolus 1,000 mL   morphine (PF) 4 MG/ML injection 4 mg   ondansetron (ZOFRAN) injection 4 mg   potassium chloride 10 mEq in 100 mL IVPB   magnesium sulfate IVPB 2 g 50 mL   diphenhydrAMINE (BENADRYL) 50 MG/ML injection    [place], [place] : cabinet override   iohexol (OMNIPAQUE) 300 MG/ML solution 100 mL    -I have reviewed the patients home medicines and have made adjustments as needed  Critical interventions none   Cardiac Monitoring: The patient was maintained on a cardiac monitor.  I personally viewed and interpreted the cardiac monitored which showed an underlying rhythm of: NSR  Social Determinants of Health:  Factors impacting patients care include: Homeless, daily alcohol use   Reevaluation: After the interventions noted above, I reevaluated the patient and found that they have :improved  Co morbidities that complicate the patient evaluation  Past Medical History:  Diagnosis Date   Alcohol abuse    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   Anxiety    Bipolar 2 disorder (HCC)    HIV (human immunodeficiency virus infection) (HCC)    Pancreatitis    PTSD (post-traumatic stress disorder)    Schizophrenia (HCC)    Seizures (HCC)    Subdural hematoma (HCC)       Dispostion: I considered admission for this  patient, and given acute alcoholic pancreatitis patient require hospital admission     Final Clinical Impression(s) / ED Diagnoses Final diagnoses:  Alcohol-induced acute pancreatitis, unspecified complication status     @PCDICTATION @    [place]ora Score, MD 09/25/23 [phone]

## 2023-09-25 NOTE — Progress Notes (Signed)
[facility] admitting physician addendum:  The patient will be transferred to the stepdown unit for closer monitoring.  His CIWA score has been over 20 several times despite being medicated with lorazepam and Keppra (500 mg p.o. tablet earlier and then 1000 mg IVPB.  In the past, he has had very to go with carbamazepine used for bipolar disorder.  We will try phenobarbital for his withdrawal symptoms since they had 2 different mechanisms of action as phenobarbital works on the GABA a receptors and carbamazepine on the sodium channels.  He has also been having low glucose levels.  Dextrose 10% initiated.  Sanda Klein, MD.

## 2023-09-26 DIAGNOSIS — K700 Alcoholic fatty liver: Secondary | ICD-10-CM

## 2023-09-26 DIAGNOSIS — F10930 Alcohol use, unspecified with withdrawal, uncomplicated: Secondary | ICD-10-CM

## 2023-09-26 DIAGNOSIS — D539 Nutritional anemia, unspecified: Secondary | ICD-10-CM

## 2023-09-26 DIAGNOSIS — K852 Alcohol induced acute pancreatitis without necrosis or infection: Secondary | ICD-10-CM | POA: Diagnosis not present

## 2023-09-26 DIAGNOSIS — F17210 Nicotine dependence, cigarettes, uncomplicated: Secondary | ICD-10-CM | POA: Diagnosis not present

## 2023-09-26 DIAGNOSIS — G40909 Epilepsy, unspecified, not intractable, without status epilepticus: Secondary | ICD-10-CM

## 2023-09-26 DIAGNOSIS — Z21 Asymptomatic human immunodeficiency virus [HIV] infection status: Secondary | ICD-10-CM

## 2023-09-26 LAB — GLUCOSE, CAPILLARY
Glucose-Capillary: 125 mg/dL — ABNORMAL HIGH (ref 70–99)
Glucose-Capillary: 116 mg/dL — ABNORMAL HIGH (ref 70–99)
Glucose-Capillary: 136 mg/dL — ABNORMAL HIGH (ref 70–99)
Glucose-Capillary: 110 mg/dL — ABNORMAL HIGH (ref 70–99)
Glucose-Capillary: 123 mg/dL — ABNORMAL HIGH (ref 70–99)
Glucose-Capillary: 135 mg/dL — ABNORMAL HIGH (ref 70–99)
Glucose-Capillary: 108 mg/dL — ABNORMAL HIGH (ref 70–99)
Glucose-Capillary: 114 mg/dL — ABNORMAL HIGH (ref 70–99)
Glucose-Capillary: 108 mg/dL — ABNORMAL HIGH (ref 70–99)
Glucose-Capillary: 134 mg/dL — ABNORMAL HIGH (ref 70–99)
Glucose-Capillary: 127 mg/dL — ABNORMAL HIGH (ref 70–99)

## 2023-09-26 LAB — CBC
HCT: 32.7 % — ABNORMAL LOW (ref 39.0–52.0)
Hemoglobin: 11 g/dL — ABNORMAL LOW (ref 13.0–17.0)
MCH: 34.1 pg — ABNORMAL HIGH (ref 26.0–34.0)
MCHC: 33.6 g/dL (ref 30.0–36.0)
MCV: 101.2 fL — ABNORMAL HIGH (ref 80.0–100.0)
Platelets: 315 10*3/uL (ref 150–400)
RBC: 3.23 MIL/uL — ABNORMAL LOW (ref 4.22–5.81)
RDW: 15 % (ref 11.5–15.5)
WBC: 2.6 10*3/uL — ABNORMAL LOW (ref 4.0–10.5)
nRBC: 0 % (ref 0.0–0.2)

## 2023-09-26 LAB — IRON AND TIBC
Iron: 140 ug/dL (ref 45–182)
Saturation Ratios: 46 % — ABNORMAL HIGH (ref 17.9–39.5)
TIBC: 304 ug/dL (ref 250–450)
UIBC: 164 ug/dL

## 2023-09-26 LAB — COMPREHENSIVE METABOLIC PANEL
ALT: 41 U/L (ref 0–44)
AST: 35 U/L (ref 15–41)
Albumin: 2.8 g/dL — ABNORMAL LOW (ref 3.5–5.0)
Alkaline Phosphatase: 74 U/L (ref 38–126)
Anion gap: 5 (ref 5–15)
BUN: <5 mg/dL — ABNORMAL LOW (ref 6–20)
CO2: 25 mmol/L (ref 22–32)
Calcium: 8.3 mg/dL — ABNORMAL LOW (ref 8.9–10.3)
Chloride: 106 mmol/L (ref 98–111)
Creatinine, Ser: 0.5 mg/dL — ABNORMAL LOW (ref 0.61–1.24)
GFR, Estimated: >60 mL/min (ref >60)
Glucose, Bld: 123 mg/dL — ABNORMAL HIGH (ref 70–99)
Potassium: 3.5 mmol/L (ref 3.5–5.1)
Sodium: 136 mmol/L (ref 135–145)
Total Bilirubin: 0.7 mg/dL (ref <1.2)
Total Protein: 5.7 g/dL — ABNORMAL LOW (ref 6.5–8.1)

## 2023-09-26 LAB — APTT: aPTT: 31 s (ref 24–36)

## 2023-09-26 LAB — LIPASE, BLOOD: Lipase: 201 U/L — ABNORMAL HIGH (ref 11–51)

## 2023-09-26 LAB — PROTIME-INR
INR: 1.1 (ref 0.8–1.2)
Prothrombin Time: 14.8 s (ref 11.4–15.2)

## 2023-09-26 LAB — MAGNESIUM: Magnesium: 1.5 mg/dL — ABNORMAL LOW (ref 1.7–2.4)

## 2023-09-26 LAB — FOLATE: Folate: 12.4 ng/mL (ref >5.9)

## 2023-09-26 LAB — PHOSPHORUS: Phosphorus: 3.6 mg/dL (ref 2.5–4.6)

## 2023-09-26 LAB — VITAMIN B12: Vitamin B-12: 267 pg/mL (ref 180–914)

## 2023-09-26 MED ORDER — MAGNESIUM SULFATE 4 GM/100ML IV SOLN
4.0000 g | Freq: Once | INTRAVENOUS | Status: AC
  Administered 2023-09-26: 4 g via INTRAVENOUS
  Filled 2023-09-26 (×2): qty 100

## 2023-09-26 MED ORDER — HYDROMORPHONE HCL 1 MG/ML IJ SOLN: 0.5000 mg | INTRAMUSCULAR | Status: DC | PRN

## 2023-09-26 MED ORDER — KCL IN DEXTROSE-NACL 20-5-0.9 MEQ/L-%-% IV SOLN
INTRAVENOUS | Status: AC
  Filled 2023-09-26 (×2): qty 1000

## 2023-09-26 MED ORDER — HYDROMORPHONE HCL 1 MG/ML IJ SOLN
1.0000 mg | INTRAMUSCULAR | Status: DC | PRN
  Administered 2023-09-26 – 2023-09-29 (×18): 1 mg via INTRAVENOUS
  Filled 2023-09-26 (×18): qty 1

## 2023-09-26 NOTE — Plan of Care (Signed)
Discussed earlier with patient plan of care for the evening, pain management and medications with some teach back displayed.  What is important to the patient is pain control and mouth care.  Problem: Education: Goal: Knowledge of General Education information will improve Description: Including pain rating scale, medication(s)/side effects and non-pharmacologic comfort measures Outcome: Progressing   Problem: Health Behavior/Discharge Planning: Goal: Ability to manage health-related needs will improve Outcome: Not Progressing

## 2023-09-26 NOTE — Progress Notes (Signed)
Gary [name]  NWG:[id number] DOB: 12-05-1985 DOA: 09/24/2023 PCP: Patient, No Pcp Per   Date of Service: the patient was seen and examined on 09/26/2023  Brief Narrative:  37 y.o. male with medical history significant of   polysubstance abuse, HIV disease not on treatment, history of subdural hematoma, history of seizures, treatment noncompliance, bipolar 2 disorder, PTSD, schizophrenia, alcohol induced pancreatitis who presented to the emergency department with complaints of severe epigastric abdominal pain after reported drinking half a gallon of vodka daily.  Upon evaluation in the emergency department patient was found to have an elevated lipase l with CT imaging of the abdomen pelvis revealing enlarging pancreatic head with surrounding inflammation compatible with acute pancreatitis.  Etiology suspected to be acute alcoholic pancreatitis.  The hospitalist group was called to assess the patient for admission to the hospital.   Assessment & Plan Acute alcoholic pancreatitis Patient presenting with severe abdominal pain and findings on CT consistent with acute alcoholic pancreatitis in the setting of ongoing heavy alcohol abuse Keeping patient n.p.o. for now as patient's symptoms are currently substantial, anticipate patient may be started on clear liquids in the next 24 hours As needed opiate-based analgesics for associated symptoms Alcohol withdrawal syndrome without complication (HCC) Frequent hospitalizations with alcohol withdrawal in the past as well as history of alcohol withdrawal seizures CIWA protocol initiated Currently on tapering regimen of phenobarbital with additional doses of as needed benzodiazepines No evidence of delirium tremens at this point Alcohol abuse Counseling aggressively on cessation HIV (human immunodeficiency virus infection) (HCC) Not currently on HAART Hypomagnesemia Replacing with intravenous magnesium sulfate Monitoring magnesium  levels with serial chemistries.  Hypokalemia Replaced Alcoholic fatty liver Significant fatty liver noted on imaging without evidence of cirrhosis at this point Counseling on alcohol cessation Nicotine dependence, cigarettes, uncomplicated Counseling on smoking cessation Nicotine replacement therapy Seizure disorder (HCC) History of alcohol withdrawal seizures Continuing Keppra Macrocytic anemia Obtaining iron panel, folate, vitamin B12    Subjective:  Patient complaining of severe epigastric pain, 9 out of 10 in intensity, sharp in quality, radiating to the back.  Patient complaining of associated tremors.  Physical Exam:  Vitals:   09/26/23 0500 09/26/23 0600 09/26/23 0620 09/26/23 0700  BP: 119/71 123/78 111/73 (!) 97/54  Pulse: 74 77 69 (!) 56  Resp: 20 17 11 15   Temp:      TempSrc:      SpO2: 99% 96% 100% 100%  Weight:      Height:        Constitutional: Awake alert and oriented x3, patient in distress due to pain Skin: no rashes, no lesions, good skin turgor noted. Eyes: Pupils are equally reactive to light.  No evidence of scleral icterus or conjunctival pallor.  ENMT: Moist mucous membranes noted.  Posterior pharynx clear of any exudate or lesions.   Respiratory: clear to auscultation bilaterally, no wheezing, no crackles. Normal respiratory effort. No accessory muscle use.  Cardiovascular: Regular rate and rhythm, no murmurs / rubs / gallops. No extremity edema. 2+ pedal pulses. No carotid bruits.  Abdomen: Abdomen is soft and nontender.  No evidence of intra-abdominal masses.  Positive bowel sounds noted in all quadrants.   Musculoskeletal: No joint deformity upper and lower extremities. Good ROM, no contractures. Normal muscle tone.    Data Reviewed:  I have personally reviewed and interpreted labs, imaging.  Significant findings are   CBC: Recent Labs  Lab 09/25/23 0003 09/26/23 0311  WBC 4.8 2.6*  HGB 13.7  11.0*  HCT 43.6 32.7*  MCV 105.1*  101.2*  PLT 390 315   Basic Metabolic Panel: Recent Labs  Lab 09/25/23 0003 09/26/23 0311  NA 138 136  K 2.7* 3.5  CL 104 106  CO2 23 25  GLUCOSE 108* 123*  BUN 8 <5*  CREATININE 0.52* 0.50*  CALCIUM 8.7* 8.3*  MG 2.4  --   PHOS 4.3  --    GFR: Estimated Creatinine Clearance: 138.8 mL/min (A) (by C-G formula based on SCr of 0.5 mg/dL (L)). Liver Function Tests: Recent Labs  Lab 09/25/23 0003 09/26/23 0311  AST 62* 35  ALT 60* 41  ALKPHOS 102 74  BILITOT 0.4 0.7  PROT 7.6 5.7*  ALBUMIN 3.8 2.8*     Telemetry: Personally reviewed.  Rhythm is normal sinus rhythm with heart rate of 80 bpm.  No dynamic ST segment changes appreciated.   Code Status:  Full code.  Code status decision has been confirmed with: patient     Severity of Illness:  The appropriate patient status for this patient is INPATIENT. Inpatient status is judged to be reasonable and necessary in order to provide the required intensity of service to ensure the patient's safety. The patient's presenting symptoms, physical exam findings, and initial radiographic and laboratory data in the context of their chronic comorbidities is felt to place them at high risk for further clinical deterioration. Furthermore, it is not anticipated that the patient will be medically stable for discharge from the hospital within 2 midnights of admission.   * I certify that at the point of admission it is my clinical judgment that the patient will require inpatient hospital care spanning beyond 2 midnights from the point of admission due to high intensity of service, high risk for further deterioration and high frequency of surveillance required.*  Time spent:  51 minutes  Author:  Marinda Elk MD  09/26/2023 7:38 AM

## 2023-09-26 NOTE — TOC Initial Note (Signed)
Transition of Care [facility]) - Initial/Assessment Note   Patient Details  Name: Gary [name] MRN: [id number] Date of Birth: 11/25/1985  Transition of Care [facility]) CM/SW Contact:    Ewing Schlein, LCSW Phone Number: 09/26/2023, 2:48 PM  Clinical Narrative: [facility] consulted for ETOH use resources, which have been added to AVS. Per chart review, patient is homeless. TOC to follow for possible additional needs.          Expected Discharge Plan:  (TBD) Barriers to Discharge: Continued Medical Work up  Patient Goals and CMS Choice Choice offered to / list presented to : NA  Expected Discharge Plan and Services In-house Referral: Clinical Social Work Post Acute Care Choice: NA Living arrangements for the past 2 months: Homeless           DME Arranged: N/A DME Agency: NA  Prior Living Arrangements/Services Living arrangements for the past 2 months: Homeless Patient language and need for interpreter reviewed:: Yes Need for Family Participation in Patient Care: No (Comment) Care giver support system in place?: Yes (comment) Criminal Activity/Legal Involvement Pertinent to Current Situation/Hospitalization: No - Comment as needed  Activities of Daily Living ADL Screening (condition at time of admission) Independently performs ADLs?: Yes (appropriate for developmental age) Is the patient deaf or have difficulty hearing?: No Does the patient have difficulty seeing, even when wearing glasses/contacts?: No Does the patient have difficulty concentrating, remembering, or making decisions?: No  Emotional Assessment Orientation: : Oriented to Self, Oriented to Place, Oriented to  Time, Oriented to Situation Alcohol / Substance Use: Alcohol Use Psych Involvement: No (comment)  Admission diagnosis:  Acute alcoholic pancreatitis [K85.20] Alcohol-induced acute pancreatitis, unspecified complication status [K85.20] Patient Active Problem List   Diagnosis Date Noted   Macrocytic anemia 09/26/2023    Alcoholic fatty liver 09/25/2023   Protein-calorie malnutrition, severe 12/25/2022   Hypomagnesemia 12/24/2022   Alcohol withdrawal (HCC) 12/23/2022   Seizure disorder (HCC) 12/17/2022   Pancytopenia (HCC) 12/14/2022   Alcohol-induced pancreatitis 12/14/2022   Delirium tremens (HCC) 10/19/2022   Alcohol abuse with alcohol-induced mood disorder (HCC) 10/16/2022   Alcohol dependence with withdrawal (HCC) 10/16/2022   Abnormal LFTs 10/16/2022   Acute alcoholic pancreatitis 09/11/2022   AKI (acute kidney injury) (HCC) 05/28/2022   Homelessness 05/28/2022   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   Metabolic acidosis with increased anion gap and accumulation of organic acids 04/16/2022   Hypokalemia 01/22/2022   Alcohol-induced insomnia (HCC)    MDD (major depressive disorder), recurrent episode, 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/26/2021   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   Alcohol abuse    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   Malingering 05/15/2020   Malnutrition of moderate degree 05/07/2020   Alcoholic ketoacidosis 05/05/2020   Thrombocytopenia (HCC) 07/25/2019   Traumatic subdural hematoma (HCC) 07/02/2019   Alcoholic intoxication with complication (HCC) 07/02/2019   Hypocalcemia 07/02/2019   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   Alcohol use disorder, severe, dependence (HCC) 04/16/2019   Major depressive disorder, 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   HIV (human immunodeficiency virus infection) (HCC) 06/16/2018   Polysubstance abuse (HCC) 06/16/2018   Nicotine dependence, cigarettes, uncomplicated 06/16/2018   PCP:  Patient, No Pcp Per Pharmacy:   [facility] - [facility] [address] [place] [address] Phone: [phone] Fax: [phone]  [facility]) [phone] - [place], [place] - [address] AT [facility] [address] [place] [address] Phone: [phone] Fax: [phone]  [facility] [phone] -  [place], [place] - [address] AT [facility] OF [address] [place] [address] Phone: [phone] Fax: [phone]  Social Determinants of Health (SDOH) Social History: SDOH Screenings   Food Insecurity: Food Insecurity Present (09/25/2023)  Housing: High Risk (09/25/2023)  Transportation Needs: Unmet Transportation Needs (09/25/2023)  Utilities: Patient Declined (09/25/2023)  Alcohol Screen: High Risk (07/25/2021)  Depression (PHQ2-9): Medium Risk (02/12/2021)  Financial Resource Strain: High Risk (06/30/2022)   Received from [facility]  Social Connections: Unknown (06/24/2022)   Received from [facility], [facility]  Stress: No Stress Concern Present (06/30/2022)   Received from [facility]  Recent Concern: Stress - Stress Concern Present (06/25/2022)   Received from [facility]  Tobacco Use: High Risk (09/24/2023)   SDOH Interventions:    Readmission Risk Interventions    09/26/2023    2:46 PM 08/23/2023    6:11 PM 06/15/2023   10:18 AM  Readmission Risk Prevention Plan  Transportation Screening Complete Complete Complete  Medication Review [employer]) Complete Complete Complete  PCP or Specialist appointment within 3-5 days of discharge  Complete   HRI or Home Care Consult Complete Complete Complete  SW Recovery Care/Counseling Consult Complete Complete Complete  Palliative Care Screening Not Applicable Not Applicable Not Applicable  Skilled Nursing Facility Not Applicable Not Applicable Not Applicable

## 2023-09-26 NOTE — Assessment & Plan Note (Signed)
Patient presenting with severe abdominal pain and findings on CT consistent with acute alcoholic pancreatitis in the setting of ongoing heavy alcohol abuse Keeping patient n.p.o. for now as patient's symptoms are currently substantial, anticipate patient may be started on clear liquids in the next 24 hours As needed opiate-based analgesics for associated symptoms

## 2023-09-26 NOTE — Assessment & Plan Note (Signed)
History of alcohol withdrawal seizures Continuing Keppra

## 2023-09-26 NOTE — Hospital Course (Addendum)
Gary [name] is a 37 y.o. male with a history of polysubstance abuse, HIV, seizures, bipolar 2 disorder, PTSD, schizophrenia, alcohol induced pancreatitis.  Patient presented secondary to severe abdominal pain and found to have evidence of recurrent alcoholic pancreatitis. Bowel rest and analgesic therapy initiated. CT imaging significant for uncomplicated pancreatitis. Hospitalization complicated by alcohol withdrawal requiring phenobarbital with taper. Diet advanced.

## 2023-09-26 NOTE — Assessment & Plan Note (Signed)
Replacing with intravenous magnesium sulfate Monitoring magnesium levels with serial chemistries.

## 2023-09-26 NOTE — Assessment & Plan Note (Signed)
Not currently on HAART

## 2023-09-26 NOTE — Assessment & Plan Note (Signed)
Replaced. °

## 2023-09-26 NOTE — Assessment & Plan Note (Signed)
Significant fatty liver noted on imaging without evidence of cirrhosis at this point Counseling on alcohol cessation

## 2023-09-26 NOTE — Assessment & Plan Note (Signed)
Counseling on smoking cessation Nicotine replacement therapy

## 2023-09-26 NOTE — Assessment & Plan Note (Signed)
Obtaining iron panel, folate, vitamin B12

## 2023-09-26 NOTE — Assessment & Plan Note (Signed)
Counseling aggressively on cessation

## 2023-09-26 NOTE — Assessment & Plan Note (Signed)
Frequent hospitalizations with alcohol withdrawal in the past as well as history of alcohol withdrawal seizures CIWA protocol initiated Currently on tapering regimen of phenobarbital with additional doses of as needed benzodiazepines No evidence of delirium tremens at this point

## 2023-09-27 DIAGNOSIS — K852 Alcohol induced acute pancreatitis without necrosis or infection: Secondary | ICD-10-CM | POA: Diagnosis not present

## 2023-09-27 LAB — GLUCOSE, CAPILLARY
Glucose-Capillary: 107 mg/dL — ABNORMAL HIGH (ref 70–99)
Glucose-Capillary: 116 mg/dL — ABNORMAL HIGH (ref 70–99)
Glucose-Capillary: 77 mg/dL (ref 70–99)
Glucose-Capillary: 91 mg/dL (ref 70–99)
Glucose-Capillary: 93 mg/dL (ref 70–99)
Glucose-Capillary: 94 mg/dL (ref 70–99)

## 2023-09-27 LAB — CBC
HCT: 38.5 % — ABNORMAL LOW (ref 39.0–52.0)
Hemoglobin: 12.1 g/dL — ABNORMAL LOW (ref 13.0–17.0)
MCH: 32.6 pg (ref 26.0–34.0)
MCHC: 31.4 g/dL (ref 30.0–36.0)
MCV: 103.8 fL — ABNORMAL HIGH (ref 80.0–100.0)
Platelets: 352 10*3/uL (ref 150–400)
RBC: 3.71 MIL/uL — ABNORMAL LOW (ref 4.22–5.81)
RDW: 14.6 % (ref 11.5–15.5)
WBC: 2.8 10*3/uL — ABNORMAL LOW (ref 4.0–10.5)
nRBC: 0 % (ref 0.0–0.2)

## 2023-09-27 LAB — COMPREHENSIVE METABOLIC PANEL
ALT: 36 U/L (ref 0–44)
AST: 25 U/L (ref 15–41)
Albumin: 3.3 g/dL — ABNORMAL LOW (ref 3.5–5.0)
Alkaline Phosphatase: 82 U/L (ref 38–126)
Anion gap: 7 (ref 5–15)
BUN: <5 mg/dL — ABNORMAL LOW (ref 6–20)
CO2: 27 mmol/L (ref 22–32)
Calcium: 8.9 mg/dL (ref 8.9–10.3)
Chloride: 102 mmol/L (ref 98–111)
Creatinine, Ser: 0.56 mg/dL — ABNORMAL LOW (ref 0.61–1.24)
GFR, Estimated: >60 mL/min (ref >60)
Glucose, Bld: 98 mg/dL (ref 70–99)
Potassium: 4 mmol/L (ref 3.5–5.1)
Sodium: 136 mmol/L (ref 135–145)
Total Bilirubin: 0.5 mg/dL (ref <1.2)
Total Protein: 6.6 g/dL (ref 6.5–8.1)

## 2023-09-27 LAB — LIPASE, BLOOD: Lipase: 93 U/L — ABNORMAL HIGH (ref 11–51)

## 2023-09-27 LAB — PHOSPHORUS: Phosphorus: 4.5 mg/dL (ref 2.5–4.6)

## 2023-09-27 LAB — MAGNESIUM: Magnesium: 2.4 mg/dL (ref 1.7–2.4)

## 2023-09-27 MED ORDER — ENOXAPARIN SODIUM 40 MG/0.4ML IJ SOSY
40.0000 mg | PREFILLED_SYRINGE | INTRAMUSCULAR | Status: DC
  Administered 2023-09-29 – 2023-09-30 (×2): 40 mg via SUBCUTANEOUS
  Filled 2023-09-27 (×3): qty 0.4

## 2023-09-27 MED ORDER — SODIUM CHLORIDE 0.9 % IV BOLUS
500.0000 mL | Freq: Once | INTRAVENOUS | Status: AC
  Administered 2023-09-27: 500 mL via INTRAVENOUS

## 2023-09-27 NOTE — Progress Notes (Signed)
PROGRESS NOTE    Gary [name]  RUE:[id number] DOB: 05/23/1986 DOA: 09/24/2023 PCP: Patient, No Pcp Per   Brief Narrative:  This 37 yrs old male with PMH significant for polysubstance abuse, HIV disease not on treatment, history of subdural hematoma, history of seizures, Medication noncompliance, bipolar 2 disorder, PTSD, schizophrenia, alcohol induced pancreatitis who presented to the ED with  severe epigastric abdominal pain after reported drinking half a gallon of vodka daily. He was found to have an elevated lipase with CT A/P revealing enlarging pancreatic head with surrounding inflammation compatible with acute pancreatitis.  Etiology suspected to be acute alcoholic pancreatitis.  Patient was admitted for further evaluation.  Assessment & Plan:   Principal Problem:   Acute alcoholic pancreatitis Active Problems:   Nicotine dependence, cigarettes, uncomplicated   Alcohol use disorder, severe, dependence (HCC)   HIV (human immunodeficiency virus infection) (HCC)   Alcohol abuse   Hypokalemia   Seizure disorder (HCC)   Alcohol withdrawal syndrome without complication (HCC)   Hypomagnesemia   Alcoholic fatty liver   Macrocytic anemia  Acute alcoholic pancreatitis: Patient presented with severe abdominal pain , elevated Lipase. CT consistent with acute pancreatitis in the setting of ongoing heavy alcohol abuse. Continue n.p.o. for now as symptoms are still present. Anticipate patient may be started on clear liquids in the next 24 hours. Continue as needed opiate-based analgesics for associated symptoms.  Alcohol withdrawal syndrome: Patient has frequent hospitalizations with alcohol withdrawal in the past as well as history of alcohol withdrawal seizures. Continue CIWA protocol  Currently on tapering regimen of phenobarbital with additional doses of as needed benzodiazepines. No evidence of delirium tremens at this point.  Alcohol abuse: Continue counseling on  cessation.  HIV (human immunodeficiency virus infection) (HCC) Not currently on HAART.  Hypomagnesemia: Replaced.  Continue to monitor   Hypokalemia Replaced, Continue to monitor.  Alcoholic fatty liver: Significant fatty liver noted on imaging without evidence of cirrhosis at this point. Counseling on alcohol cessation.  Nicotine dependence, cigarettes, uncomplicated Counseling on smoking cessation Nicotine replacement therapy.  Seizure disorder (HCC) History of alcohol withdrawal seizures Continuing Keppra.  Macrocytic anemia Iron,  folate and B12 normal.  DVT prophylaxis:SCDs Code Status: Full code Family Communication: No family at bed side Disposition Plan:    Status is: Inpatient Remains inpatient appropriate because: Admitted for acute pancreatitis in the setting of binge EtOH intake.   Consultants:  None  Procedures: CT A/P  Antimicrobials: Anti-infectives (From admission, onward)    None      Subjective: Patient was seen and examined at bedside.  Overnight events noted.   Patient appears severely emaciated. He appears very deconditioned, weak, has significant shaking while talking.  Objective: Vitals:   09/27/23 0630 09/27/23 0700 09/27/23 0800 09/27/23 0900  BP: 95/65 103/74 108/69 (!) 79/56  Pulse: (!) 52 60 68 (!) 54  Resp: (!) 9 15 14 15   Temp:   (!) 97.5 F (36.4 C)   TempSrc:   Oral   SpO2: 99% 100% 96% 99%  Weight:      Height:        Intake/Output Summary (Last 24 hours) at 09/27/2023 1100 Last data filed at 09/27/2023 0800 Gross per 24 hour  Intake 2778.03 ml  Output 3810 ml  Net -1031.97 ml   Filed Weights   09/24/23 2339 09/26/23 0152 09/27/23 0455  Weight: 68 kg 78.8 kg 64.9 kg    Examination:  General exam: Appears calm and comfortable, deconditioned, not in any acute distress.  Respiratory system: Clear to auscultation. Respiratory effort normal.  RR 16 Cardiovascular system: S1 & S2 heard, RRR.  No murmur. No  pedal edema. Gastrointestinal system: Abdomen is non distended, soft and non tender.  Normal bowel sounds heard. Central nervous system: Alert and oriented x 3. No focal neurological deficits. Extremities: No edema, no cyanosis, no clubbing. Skin: No rashes, lesions or ulcers Psychiatry: Judgement and insight appear normal. Mood & affect appropriate.     Data Reviewed: I have personally reviewed following labs and imaging studies  CBC: Recent Labs  Lab 09/25/23 0003 09/26/23 0311 09/27/23 0252  WBC 4.8 2.6* 2.8*  HGB 13.7 11.0* 12.1*  HCT 43.6 32.7* 38.5*  MCV 105.1* 101.2* 103.8*  PLT 390 315 352   Basic Metabolic Panel: Recent Labs  Lab 09/25/23 0003 09/26/23 0311 09/26/23 0800 09/27/23 0252  NA 138 136  --  136  K 2.7* 3.5  --  4.0  CL 104 106  --  102  CO2 23 25  --  27  GLUCOSE 108* 123*  --  98  BUN 8 <5*  --  <5*  CREATININE 0.52* 0.50*  --  0.56*  CALCIUM 8.7* 8.3*  --  8.9  MG 2.4  --  1.5* 2.4  PHOS 4.3  --  3.6 4.5   GFR: Estimated Creatinine Clearance: 116.1 mL/min (A) (by C-G formula based on SCr of 0.56 mg/dL (L)). Liver Function Tests: Recent Labs  Lab 09/25/23 0003 09/26/23 0311 09/27/23 0252  AST 62* 35 25  ALT 60* 41 36  ALKPHOS 102 74 82  BILITOT 0.4 0.7 0.5  PROT 7.6 5.7* 6.6  ALBUMIN 3.8 2.8* 3.3*   Recent Labs  Lab 09/25/23 0003 09/26/23 0311 09/27/23 0252  LIPASE 625* 201* 93*   No results for input(s): "AMMONIA" in the last 168 hours. Coagulation Profile: Recent Labs  Lab 09/26/23 0800  INR 1.1   Cardiac Enzymes: No results for input(s): "CKTOTAL", "CKMB", "CKMBINDEX", "TROPONINI" in the last 168 hours. BNP (last 3 results) No results for input(s): "PROBNP" in the last 8760 hours. HbA1C: No results for input(s): "HGBA1C" in the last 72 hours. CBG: Recent Labs  Lab 09/26/23 1954 09/26/23 2200 09/27/23 0019 09/27/23 0251 09/27/23 0754  GLUCAP 134* 127* 116* 94 93   Lipid Profile: No results for input(s):  "CHOL", "HDL", "LDLCALC", "TRIG", "CHOLHDL", "LDLDIRECT" in the last 72 hours. Thyroid Function Tests: No results for input(s): "TSH", "T4TOTAL", "FREET4", "T3FREE", "THYROIDAB" in the last 72 hours. Anemia Panel: Recent Labs    09/26/23 0800  VITAMINB12 267  FOLATE 12.4  TIBC 304  IRON 140   Sepsis Labs: No results for input(s): "PROCALCITON", "LATICACIDVEN" in the last 168 hours.  No results found for this or any previous visit (from the past 240 hour(s)).   Radiology Studies: No results found.  Scheduled Meds:  Chlorhexidine Gluconate Cloth  6 each Topical Daily   folic acid  1 mg Oral Daily   levETIRAcetam  500 mg Oral BID   multivitamin with minerals  1 tablet Oral Daily   pantoprazole (PROTONIX) IV  40 mg Intravenous Q24H   phenobarbital  16.2 mg Oral BID   thiamine  100 mg Oral Daily   Or   thiamine  100 mg Intravenous Daily   Continuous Infusions:  dextrose 5 % and 0.9 % NaCl with KCl 20 mEq/L 125 mL/hr at 09/27/23 0729   sodium chloride       LOS: 2 days    Time spent:  50 mins.    Willeen Niece, MD [facility]   If 7PM-7AM, please contact night-coverage

## 2023-09-27 NOTE — Plan of Care (Signed)
  Problem: Health Behavior/Discharge Planning: Goal: Ability to identify changes in lifestyle to reduce recurrence of condition will improve Outcome: Progressing Goal: Identification of resources available to assist in meeting health care needs will improve Outcome: Progressing

## 2023-09-28 DIAGNOSIS — K852 Alcohol induced acute pancreatitis without necrosis or infection: Secondary | ICD-10-CM | POA: Diagnosis not present

## 2023-09-28 LAB — BASIC METABOLIC PANEL
Anion gap: 9 (ref 5–15)
BUN: <5 mg/dL — ABNORMAL LOW (ref 6–20)
CO2: 27 mmol/L (ref 22–32)
Calcium: 9.5 mg/dL (ref 8.9–10.3)
Chloride: 99 mmol/L (ref 98–111)
Creatinine, Ser: 0.61 mg/dL (ref 0.61–1.24)
GFR, Estimated: >60 mL/min (ref >60)
Glucose, Bld: 97 mg/dL (ref 70–99)
Potassium: 4.4 mmol/L (ref 3.5–5.1)
Sodium: 135 mmol/L (ref 135–145)

## 2023-09-28 LAB — CBC
HCT: 41.6 % (ref 39.0–52.0)
Hemoglobin: 13.6 g/dL (ref 13.0–17.0)
MCH: 33.1 pg (ref 26.0–34.0)
MCHC: 32.7 g/dL (ref 30.0–36.0)
MCV: 101.2 fL — ABNORMAL HIGH (ref 80.0–100.0)
Platelets: 479 10*3/uL — ABNORMAL HIGH (ref 150–400)
RBC: 4.11 MIL/uL — ABNORMAL LOW (ref 4.22–5.81)
RDW: 14.3 % (ref 11.5–15.5)
WBC: 4.4 10*3/uL (ref 4.0–10.5)
nRBC: 0 % (ref 0.0–0.2)

## 2023-09-28 LAB — GLUCOSE, CAPILLARY
Glucose-Capillary: 109 mg/dL — ABNORMAL HIGH (ref 70–99)
Glucose-Capillary: 112 mg/dL — ABNORMAL HIGH (ref 70–99)
Glucose-Capillary: 113 mg/dL — ABNORMAL HIGH (ref 70–99)
Glucose-Capillary: 114 mg/dL — ABNORMAL HIGH (ref 70–99)
Glucose-Capillary: 80 mg/dL (ref 70–99)
Glucose-Capillary: 98 mg/dL (ref 70–99)

## 2023-09-28 LAB — PHOSPHORUS: Phosphorus: 5 mg/dL — ABNORMAL HIGH (ref 2.5–4.6)

## 2023-09-28 LAB — MAGNESIUM: Magnesium: 2.1 mg/dL (ref 1.7–2.4)

## 2023-09-28 MED ORDER — MELATONIN 5 MG PO TABS
5.0000 mg | ORAL_TABLET | Freq: Once | ORAL | Status: AC
  Administered 2023-09-28: 5 mg via ORAL
  Filled 2023-09-28: qty 1

## 2023-09-28 MED ORDER — SODIUM CHLORIDE 0.9 % IV BOLUS
500.0000 mL | Freq: Once | INTRAVENOUS | Status: AC
  Administered 2023-09-28: 500 mL via INTRAVENOUS

## 2023-09-28 NOTE — Progress Notes (Signed)
PROGRESS NOTE    Gary [name]  GYI:[id number] DOB: 10-12-1986 DOA: 09/24/2023 PCP: Patient, No Pcp Per   Brief Narrative:  This 37 yrs old male with PMH significant for polysubstance abuse, HIV disease not on treatment, history of subdural hematoma, history of seizures, Medication noncompliance, bipolar 2 disorder, PTSD, schizophrenia, alcohol induced pancreatitis who presented to the ED with severe epigastric abdominal pain after reported drinking half a gallon of vodka daily. He was found to have an elevated lipase with CT A/P revealing enlarging pancreatic head with surrounding inflammation compatible with acute pancreatitis.  Etiology suspected to be acute alcoholic pancreatitis.  Patient was admitted for further evaluation.  Assessment & Plan:   Principal Problem:   Acute alcoholic pancreatitis Active Problems:   Nicotine dependence, cigarettes, uncomplicated   Alcohol use disorder, severe, dependence (HCC)   HIV (human immunodeficiency virus infection) (HCC)   Alcohol abuse   Hypokalemia   Seizure disorder (HCC)   Alcohol withdrawal syndrome without complication (HCC)   Hypomagnesemia   Alcoholic fatty liver   Macrocytic anemia  Acute alcoholic pancreatitis: Patient presented with severe abdominal pain , elevated Lipase. CT consistent with acute pancreatitis in the setting of ongoing heavy alcohol abuse. Started on clear liquid diet, advance as tolerated. Continue as needed opiate-based analgesics for associated symptoms.  Alcohol withdrawal syndrome: Patient has frequent hospitalizations with alcohol withdrawal in the past as well as history of alcohol withdrawal seizures. Continue CIWA protocol  Currently on tapering regimen of phenobarbital with additional doses of as needed benzodiazepines. No evidence of delirium tremens at this point.  Alcohol abuse: Continue counseling on cessation.  HIV (human immunodeficiency virus infection) (HCC) Not currently on  HAART.  Hypomagnesemia: Replaced.  Continue to monitor   Hypokalemia Replaced, Resolved.  Alcoholic fatty liver: Significant fatty liver noted on imaging without evidence of cirrhosis at this point. Counseling on alcohol cessation.  Nicotine dependence, cigarettes, uncomplicated Counseling on smoking cessation. Nicotine replacement therapy.  Seizure disorder (HCC) History of alcohol withdrawal seizures. Continuing Keppra.  Macrocytic anemia Iron,  folate and B12 normal.  DVT prophylaxis:SCDs Code Status: Full code Family Communication: No family at bed side Disposition Plan:    Status is: Inpatient Remains inpatient appropriate because: Admitted for acute pancreatitis in the setting of binge EtOH intake.   Consultants:  None  Procedures: CT A/P  Antimicrobials: Anti-infectives (From admission, onward)    None      Subjective: Patient was seen and examined at bedside. Overnight events noted.   Patient appears severely emaciated, very deconditioned, weak and has significant shaking while talking.  Objective: Vitals:   09/28/23 0400 09/28/23 0500 09/28/23 0531 09/28/23 0730  BP: (!) 86/58 (!) 86/44 (!) 93/54   Pulse: 72 90 79   Resp: 14 (!) 25 (!) 22   Temp:    98.2 F (36.8 C)  TempSrc:    Oral  SpO2: 95% 91% 95%   Weight:  65.3 kg    Height:        Intake/Output Summary (Last 24 hours) at 09/28/2023 1106 Last data filed at 09/28/2023 0513 Gross per 24 hour  Intake 1879.58 ml  Output 3425 ml  Net -1545.42 ml   Filed Weights   09/26/23 0152 09/27/23 0455 09/28/23 0500  Weight: 78.8 kg 64.9 kg 65.3 kg    Examination:  General exam: Appears calm and comfortable, deconditioned, not in any acute distress. Respiratory system: Clear to auscultation. Respiratory effort normal.  RR 14 Cardiovascular system: S1 & S2 heard, RRR.  No murmur. No pedal edema. Gastrointestinal system: Abdomen is non distended, soft and non tender.  Normal bowel sounds  heard. Central nervous system: Alert and oriented x 3. No focal neurological deficits. Extremities: No edema, no cyanosis, no clubbing. Skin: No rashes, lesions or ulcers Psychiatry: Judgement and insight appear normal. Mood & affect appropriate.     Data Reviewed: I have personally reviewed following labs and imaging studies  CBC: Recent Labs  Lab 09/25/23 0003 09/26/23 0311 09/27/23 0252 09/28/23 0252  WBC 4.8 2.6* 2.8* 4.4  HGB 13.7 11.0* 12.1* 13.6  HCT 43.6 32.7* 38.5* 41.6  MCV 105.1* 101.2* 103.8* 101.2*  PLT 390 315 352 479*   Basic Metabolic Panel: Recent Labs  Lab 09/25/23 0003 09/26/23 0311 09/26/23 0800 09/27/23 0252 09/28/23 0252  NA 138 136  --  136 135  K 2.7* 3.5  --  4.0 4.4  CL 104 106  --  102 99  CO2 23 25  --  27 27  GLUCOSE 108* 123*  --  98 97  BUN 8 <5*  --  <5* <5*  CREATININE 0.52* 0.50*  --  0.56* 0.61  CALCIUM 8.7* 8.3*  --  8.9 9.5  MG 2.4  --  1.5* 2.4 2.1  PHOS 4.3  --  3.6 4.5 5.0*   GFR: Estimated Creatinine Clearance: 116.8 mL/min (by C-G formula based on SCr of 0.61 mg/dL). Liver Function Tests: Recent Labs  Lab 09/25/23 0003 09/26/23 0311 09/27/23 0252  AST 62* 35 25  ALT 60* 41 36  ALKPHOS 102 74 82  BILITOT 0.4 0.7 0.5  PROT 7.6 5.7* 6.6  ALBUMIN 3.8 2.8* 3.3*   Recent Labs  Lab 09/25/23 0003 09/26/23 0311 09/27/23 0252  LIPASE 625* 201* 93*   No results for input(s): "AMMONIA" in the last 168 hours. Coagulation Profile: Recent Labs  Lab 09/26/23 0800  INR 1.1   Cardiac Enzymes: No results for input(s): "CKTOTAL", "CKMB", "CKMBINDEX", "TROPONINI" in the last 168 hours. BNP (last 3 results) No results for input(s): "PROBNP" in the last 8760 hours. HbA1C: No results for input(s): "HGBA1C" in the last 72 hours. CBG: Recent Labs  Lab 09/27/23 1522 09/27/23 1949 09/27/23 2358 09/28/23 0339 09/28/23 0745  GLUCAP 107* 91 109* 98 80   Lipid Profile: No results for input(s): "CHOL", "HDL", "LDLCALC",  "TRIG", "CHOLHDL", "LDLDIRECT" in the last 72 hours. Thyroid Function Tests: No results for input(s): "TSH", "T4TOTAL", "FREET4", "T3FREE", "THYROIDAB" in the last 72 hours. Anemia Panel: Recent Labs    09/26/23 0800  VITAMINB12 267  FOLATE 12.4  TIBC 304  IRON 140   Sepsis Labs: No results for input(s): "PROCALCITON", "LATICACIDVEN" in the last 168 hours.  No results found for this or any previous visit (from the past 240 hour(s)).   Radiology Studies: No results found.  Scheduled Meds:  Chlorhexidine Gluconate Cloth  6 each Topical Daily   enoxaparin (LOVENOX) injection  40 mg Subcutaneous Q24H   folic acid  1 mg Oral Daily   levETIRAcetam  500 mg Oral BID   multivitamin with minerals  1 tablet Oral Daily   pantoprazole (PROTONIX) IV  40 mg Intravenous Q24H   thiamine  100 mg Oral Daily   Or   thiamine  100 mg Intravenous Daily   Continuous Infusions:     LOS: 3 days    Time spent: 35 mins.    Willeen Niece, MD [facility]   If 7PM-7AM, please contact night-coverage

## 2023-09-29 DIAGNOSIS — K852 Alcohol induced acute pancreatitis without necrosis or infection: Secondary | ICD-10-CM | POA: Diagnosis not present

## 2023-09-29 LAB — HEPATIC FUNCTION PANEL
ALT: 25 U/L (ref 0–44)
AST: 16 U/L (ref 15–41)
Albumin: 3.2 g/dL — ABNORMAL LOW (ref 3.5–5.0)
Alkaline Phosphatase: 85 U/L (ref 38–126)
Bilirubin, Direct: 0.1 mg/dL (ref 0.0–0.2)
Indirect Bilirubin: 0.4 mg/dL (ref 0.3–0.9)
Total Bilirubin: 0.5 mg/dL (ref <1.2)
Total Protein: 6.8 g/dL (ref 6.5–8.1)

## 2023-09-29 LAB — GLUCOSE, CAPILLARY
Glucose-Capillary: 104 mg/dL — ABNORMAL HIGH (ref 70–99)
Glucose-Capillary: 105 mg/dL — ABNORMAL HIGH (ref 70–99)
Glucose-Capillary: 108 mg/dL — ABNORMAL HIGH (ref 70–99)
Glucose-Capillary: 129 mg/dL — ABNORMAL HIGH (ref 70–99)
Glucose-Capillary: 77 mg/dL (ref 70–99)
Glucose-Capillary: 84 mg/dL (ref 70–99)
Glucose-Capillary: 97 mg/dL (ref 70–99)

## 2023-09-29 LAB — LIPASE, BLOOD: Lipase: 189 U/L — ABNORMAL HIGH (ref 11–51)

## 2023-09-29 MED ORDER — HYDROCODONE-ACETAMINOPHEN 5-325 MG PO TABS
1.0000 | ORAL_TABLET | ORAL | Status: DC | PRN
Start: 2023-09-29 — End: 2023-09-30
  Administered 2023-09-29 (×2): 2 via ORAL
  Administered 2023-09-29: 1 via ORAL
  Administered 2023-09-29 – 2023-09-30 (×4): 2 via ORAL
  Filled 2023-09-29 (×7): qty 2

## 2023-09-29 MED ORDER — SODIUM CHLORIDE 0.9 % IV BOLUS
500.0000 mL | Freq: Once | INTRAVENOUS | Status: AC
  Administered 2023-09-29: 500 mL via INTRAVENOUS

## 2023-09-29 MED ORDER — HYDROXYZINE HCL 25 MG PO TABS
25.0000 mg | ORAL_TABLET | Freq: Three times a day (TID) | ORAL | Status: DC | PRN
  Administered 2023-09-29 – 2023-09-30 (×3): 25 mg via ORAL
  Filled 2023-09-29 (×3): qty 1

## 2023-09-29 NOTE — Plan of Care (Signed)
  Problem: Clinical Measurements: Goal: Ability to maintain clinical measurements within normal limits will improve Outcome: Progressing Goal: Diagnostic test results will improve Outcome: Progressing Goal: Cardiovascular complication will be avoided Outcome: Progressing   Problem: Activity: Goal: Risk for activity intolerance will decrease Outcome: Progressing   Problem: Nutrition: Goal: Adequate nutrition will be maintained Outcome: Progressing   Problem: Coping: Goal: Level of anxiety will decrease Outcome: Progressing

## 2023-09-29 NOTE — Progress Notes (Signed)
PROGRESS NOTE    Gary [name]  ZOX:[id number] DOB: 29-Jan-1986 DOA: 09/24/2023 PCP: Patient, No Pcp Per   Brief Narrative: Gary [name] is a 37 y.o. male with a history of polysubstance abuse, HIV, seizures, bipolar 2 disorder, PTSD, schizophrenia, alcohol induced pancreatitis.  Patient presented secondary to severe abdominal pain and found to have evidence of recurrent alcoholic pancreatitis. Bowel rest and analgesic therapy initiated. CT imaging significant for uncomplicated pancreatitis. Hospitalization complicated by alcohol withdrawal requiring phenobarbital with taper. Diet advanced.   Assessment and Plan:  Acute alcoholic pancreatitis Patient with pain, lipase of 625 and CT evidence consistent with acute pancreatitis related to alcohol abuse. Patient  managed with bowel rest, IV fluids and opioid analgesics. Diet was advanced. -Discontinue hydromorphone and start hydrocodone PRN -Continue soft diet  Alcohol withdrawal syndrome Patient managed with CIWA, including phenobarbital with taper. Significant withdrawal resolved.  Alcohol abuse Counseled on cessation.  HIV Patient was previously prescribed Biktarvy but is not currently taking this regimen. Biktarvy not resumed on admission.  Hypomagnesemia Supplementation given. Resolved.  Hypokalemia Supplementation given. Resolved.  Alcoholic fatty liver Noted on imaging. No evidence of cirrhosis noted.  Nicotine dependence Noted. -Continue nicotine prn  Seizure disorder -Continue   Macrocytic anemia Folate and vitamin B12 normal.   DVT prophylaxis: Lovenox Code Status:   Code Status: Full Code Family Communication: None at bedside Disposition Plan: Discharge home likely in 24 hours if pain remains manageable and patient continues to tolerate a soft diet   Consultants:  None  Procedures:  None  Antimicrobials: None    Subjective: Pain with oral intake, however patient has been eating fatty  foods.  Objective: BP (!) 96/57   Pulse 61   Temp 97.8 F (36.6 C) (Oral)   Resp (!) 22   Ht 6' (1.829 m)   Wt 67.3 kg   SpO2 99%   BMI 20.12 kg/m   Examination:  General exam: Appears calm and comfortable Respiratory system: Clear to auscultation. Respiratory effort normal. Cardiovascular system: S1 & S2 heard, RRR. No murmurs, rubs, gallops or clicks. Gastrointestinal system: Abdomen is nondistended, soft and tender. No organomegaly or masses felt. Normal bowel sounds heard. Central nervous system: Alert and oriented. Mild tremor of bilateral hands noted Musculoskeletal: No edema. No calf tenderness Skin: No cyanosis. No rashes Psychiatry: Judgement and insight appear normal. Mood & affect appropriate.    Data Reviewed: I have personally reviewed following labs and imaging studies  CBC Lab Results  Component Value Date   WBC 4.4 09/28/2023   RBC 4.11 (L) 09/28/2023   HGB 13.6 09/28/2023   HCT 41.6 09/28/2023   MCV 101.2 (H) 09/28/2023   MCH 33.1 09/28/2023   PLT 479 (H) 09/28/2023   MCHC 32.7 09/28/2023   RDW 14.3 09/28/2023   LYMPHSABS 0.5 (L) 08/28/2023   MONOABS 0.7 08/28/2023   EOSABS 0.0 08/28/2023   BASOSABS 0.0 08/28/2023     Last metabolic panel Lab Results  Component Value Date   NA 135 09/28/2023   K 4.4 09/28/2023   CL 99 09/28/2023   CO2 27 09/28/2023   BUN <5 (L) 09/28/2023   CREATININE 0.61 09/28/2023   GLUCOSE 97 09/28/2023   GFRNONAA >60 09/28/2023   GFRAA 124 03/20/2021   CALCIUM 9.5 09/28/2023   PHOS 5.0 (H) 09/28/2023   PROT 6.8 09/29/2023   ALBUMIN 3.2 (L) 09/29/2023   BILITOT 0.5 09/29/2023   ALKPHOS 85 09/29/2023   AST 16 09/29/2023   ALT 25 09/29/2023  ANIONGAP 9 09/28/2023    GFR: Estimated Creatinine Clearance: 120.3 mL/min (by C-G formula based on SCr of 0.61 mg/dL).  No results found for this or any previous visit (from the past 240 hour(s)).    Radiology Studies: No results found.    LOS: 4 days    Gary Hawking, MD [facility] 09/29/2023, 7:47 AM   If 7PM-7AM, please contact night-coverage [email or web address]

## 2023-09-30 ENCOUNTER — Emergency Department (HOSPITAL_COMMUNITY): Payer: MEDICAID

## 2023-09-30 ENCOUNTER — Inpatient Hospital Stay (HOSPITAL_COMMUNITY): Payer: MEDICAID

## 2023-09-30 ENCOUNTER — Encounter (HOSPITAL_COMMUNITY): Payer: Self-pay

## 2023-09-30 ENCOUNTER — Emergency Department (HOSPITAL_COMMUNITY)
Admission: EM | Admit: 2023-09-30 | Discharge: 2023-09-30 | Disposition: A | Discharge status: Home / Self Care | Payer: MEDICAID | Attending: Emergency Medicine | Admitting: Emergency Medicine

## 2023-09-30 ENCOUNTER — Other Ambulatory Visit: Payer: Self-pay

## 2023-09-30 DIAGNOSIS — Y906 Blood alcohol level of 120-199 mg/100 ml: Secondary | ICD-10-CM | POA: Diagnosis not present

## 2023-09-30 DIAGNOSIS — K2900 Acute gastritis without bleeding: Secondary | ICD-10-CM

## 2023-09-30 DIAGNOSIS — K852 Alcohol induced acute pancreatitis without necrosis or infection: Secondary | ICD-10-CM | POA: Diagnosis not present

## 2023-09-30 DIAGNOSIS — R109 Unspecified abdominal pain: Secondary | ICD-10-CM | POA: Diagnosis present

## 2023-09-30 DIAGNOSIS — R1110 Vomiting, unspecified: Secondary | ICD-10-CM | POA: Insufficient documentation

## 2023-09-30 DIAGNOSIS — F1010 Alcohol abuse, uncomplicated: Secondary | ICD-10-CM

## 2023-09-30 LAB — COMPREHENSIVE METABOLIC PANEL
ALT: 20 U/L (ref 0–44)
AST: 16 U/L (ref 15–41)
Albumin: 3.6 g/dL (ref 3.5–5.0)
Alkaline Phosphatase: 76 U/L (ref 38–126)
Anion gap: 13 (ref 5–15)
BUN: 9 mg/dL (ref 6–20)
CO2: 21 mmol/L — ABNORMAL LOW (ref 22–32)
Calcium: 9.5 mg/dL (ref 8.9–10.3)
Chloride: 101 mmol/L (ref 98–111)
Creatinine, Ser: 0.63 mg/dL (ref 0.61–1.24)
GFR, Estimated: >60 mL/min (ref >60)
Glucose, Bld: 97 mg/dL (ref 70–99)
Potassium: 3.3 mmol/L — ABNORMAL LOW (ref 3.5–5.1)
Sodium: 135 mmol/L (ref 135–145)
Total Bilirubin: 0.5 mg/dL (ref <1.2)
Total Protein: 7.6 g/dL (ref 6.5–8.1)

## 2023-09-30 LAB — CBC WITH DIFFERENTIAL/PLATELET
Abs Immature Granulocytes: 0.01 10*3/uL (ref 0.00–0.07)
Basophils Absolute: 0.1 10*3/uL (ref 0.0–0.1)
Basophils Relative: 1 %
Eosinophils Absolute: 0.1 10*3/uL (ref 0.0–0.5)
Eosinophils Relative: 1 %
HCT: 39.3 % (ref 39.0–52.0)
Hemoglobin: 13.5 g/dL (ref 13.0–17.0)
Immature Granulocytes: 0 %
Lymphocytes Relative: 34 %
Lymphs Abs: 2 10*3/uL (ref 0.7–4.0)
MCH: 33.8 pg (ref 26.0–34.0)
MCHC: 34.4 g/dL (ref 30.0–36.0)
MCV: 98.5 fL (ref 80.0–100.0)
Monocytes Absolute: 0.4 10*3/uL (ref 0.1–1.0)
Monocytes Relative: 7 %
Neutro Abs: 3.4 10*3/uL (ref 1.7–7.7)
Neutrophils Relative %: 57 %
Platelets: 470 10*3/uL — ABNORMAL HIGH (ref 150–400)
RBC: 3.99 MIL/uL — ABNORMAL LOW (ref 4.22–5.81)
RDW: 14.3 % (ref 11.5–15.5)
WBC: 5.9 10*3/uL (ref 4.0–10.5)
nRBC: 0 % (ref 0.0–0.2)

## 2023-09-30 LAB — ETHANOL: Alcohol, Ethyl (B): 184 mg/dL — ABNORMAL HIGH (ref <10)

## 2023-09-30 LAB — GLUCOSE, CAPILLARY
Glucose-Capillary: 84 mg/dL (ref 70–99)
Glucose-Capillary: 72 mg/dL (ref 70–99)
Glucose-Capillary: 109 mg/dL — ABNORMAL HIGH (ref 70–99)
Glucose-Capillary: 114 mg/dL — ABNORMAL HIGH (ref 70–99)

## 2023-09-30 LAB — LIPASE, BLOOD: Lipase: 157 U/L — ABNORMAL HIGH (ref 11–51)

## 2023-09-30 MED ORDER — POLYETHYLENE GLYCOL 3350 17 GM/SCOOP PO POWD: 17.0000 g | Freq: Every day | ORAL | 0 refills | Status: DC

## 2023-09-30 MED ORDER — DOCUSATE SODIUM 100 MG PO CAPS
100.0000 mg | ORAL_CAPSULE | Freq: Every day | ORAL | Status: DC | PRN
  Administered 2023-09-30: 100 mg via ORAL
  Filled 2023-09-30: qty 1

## 2023-09-30 MED ORDER — HYDROCODONE-ACETAMINOPHEN 5-325 MG PO TABS: 1.0000 | ORAL_TABLET | ORAL | 0 refills | Status: DC | PRN

## 2023-09-30 MED ORDER — POLYETHYLENE GLYCOL 3350 17 G PO PACK
17.0000 g | PACK | Freq: Every day | ORAL | Status: DC
Start: 2023-09-30 — End: 2023-09-30
  Administered 2023-09-30: 17 g via ORAL
  Filled 2023-09-30: qty 1

## 2023-09-30 MED ORDER — BISACODYL 10 MG RE SUPP
10.0000 mg | Freq: Once | RECTAL | Status: AC
  Administered 2023-09-30: 10 mg via RECTAL
  Filled 2023-09-30: qty 1

## 2023-09-30 MED ORDER — FAMOTIDINE 20 MG PO TABS: 40.0000 mg | ORAL_TABLET | Freq: Once | ORAL | Status: DC

## 2023-09-30 MED ORDER — FENTANYL CITRATE PF 50 MCG/ML IJ SOSY
100.0000 ug | PREFILLED_SYRINGE | Freq: Once | INTRAMUSCULAR | Status: AC
  Administered 2023-09-30: 100 ug via INTRAVENOUS
  Filled 2023-09-30: qty 2

## 2023-09-30 MED ORDER — DIPHENHYDRAMINE HCL 50 MG/ML IJ SOLN
12.5000 mg | Freq: Once | INTRAMUSCULAR | Status: AC
Start: 2023-09-30 — End: 2023-09-30
  Administered 2023-09-30: 12.5 mg via INTRAVENOUS
  Filled 2023-09-30: qty 1

## 2023-09-30 MED ORDER — LACTATED RINGERS IV BOLUS
1000.0000 mL | Freq: Once | INTRAVENOUS | Status: AC
  Administered 2023-09-30: 1000 mL via INTRAVENOUS

## 2023-09-30 MED ORDER — LACTATED RINGERS IV SOLN: INTRAVENOUS | Status: DC

## 2023-09-30 MED ORDER — METOCLOPRAMIDE HCL 5 MG/ML IJ SOLN
5.0000 mg | Freq: Once | INTRAMUSCULAR | Status: AC
  Administered 2023-09-30: 5 mg via INTRAVENOUS
  Filled 2023-09-30: qty 2

## 2023-09-30 MED ORDER — PANTOPRAZOLE SODIUM 40 MG PO TBEC
40.0000 mg | DELAYED_RELEASE_TABLET | Freq: Every day | ORAL | 0 refills | Status: DC
  Filled 2023-09-30: qty 30, 30d supply, fill #0

## 2023-09-30 NOTE — ED Provider Notes (Signed)
[place] EMERGENCY DEPARTMENT AT [facility] Provider Note   CSN: [id number] Arrival date & time: 09/30/23  2027     History  Chief Complaint  Patient presents with   Abdominal Pain    Gary [name] is a 37 y.o. male.  37 year old male with history of polysubstance abuse who presents abdominal pain.  Patient was just hospitalized and discharged today for pancreatitis.  States he drank fifth of wine and started to have abdominal pain he has had emesis x 2.  No blood or coffee grounds noted.  No black bloody stools with no fever or chills.  Donnell pain is epigastric goes to his back is similar to his prior pancreatitis.       Home Medications Prior to Admission medications   Medication Sig Start Date End Date Taking? Authorizing Provider  bictegravir-emtricitabine-tenofovir AF (BIKTARVY) 50-200-25 MG TABS tablet Take 1 tablet by mouth daily. Patient not taking: Reported on 09/13/2023    [provider]  folic acid (FOLVITE) 1 MG tablet Take 1 tablet (1 mg total) by mouth daily. Patient not taking: Reported on 09/13/2023 09/02/23 10/02/23  Lanae Boast, MD  HYDROcodone-acetaminophen (NORCO/VICODIN) 5-325 MG tablet Take 1 tablet by mouth every 4 (four) hours as needed for up to 3 days for severe pain (pain score 7-10). 09/30/23 10/03/23  Narda Bonds, MD  levETIRAcetam (KEPPRA) 500 MG tablet Take 1 tablet (500 mg total) by mouth 2 (two) times daily. Patient not taking: Reported on 09/13/2023 08/03/23   Dione Booze, MD  polyethylene glycol powder [facility]) 17 GM/SCOOP powder Take 17 g by mouth daily. 09/30/23   Narda Bonds, MD  thiamine (VITAMIN B-1) 100 MG tablet Take 1 tablet (100 mg total) by mouth daily. Patient not taking: Reported on 09/13/2023 09/02/23 10/02/23  Lanae Boast, MD  famotidine (PEPCID) 20 MG tablet Take 1 tablet (20 mg total) by mouth 2 (two) times daily. Patient not taking: Reported on 06/01/2019 05/14/19 06/02/19  Benjiman Core, MD       Allergies    Tegretol [carbamazepine] and Caffeine    Review of Systems   Review of Systems  All other systems reviewed and are negative.   Physical Exam Updated Vital Signs BP 98/70 (BP Location: Right Arm)   Pulse (!) 103   Temp 98 F (36.7 C) (Oral)   Resp 18   Wt 65 kg   SpO2 100%   BMI 19.43 kg/m  Physical Exam Vitals and nursing note reviewed.  Constitutional:      Gary: He is not in acute distress.    Appearance: Normal appearance. He is well-developed. He is not toxic-appearing.  HENT:     Head: Normocephalic and atraumatic.  Eyes:     Gary: Lids are normal.     Conjunctiva/sclera: Conjunctivae normal.     Pupils: Pupils are equal, round, and reactive to light.  Neck:     Thyroid: No thyroid mass.     Trachea: No tracheal deviation.  Cardiovascular:     Rate and Rhythm: Normal rate and regular rhythm.     Heart sounds: Normal heart sounds. No murmur heard.    No gallop.  Pulmonary:     Effort: Pulmonary effort is normal. No respiratory distress.     Breath sounds: Normal breath sounds. No stridor. No decreased breath sounds, wheezing, rhonchi or rales.  Abdominal:     Gary: There is no distension.     Palpations: Abdomen is soft.     Tenderness:  There is abdominal tenderness in the epigastric area. There is guarding. There is no rebound.    Musculoskeletal:        Gary: No tenderness. Normal range of motion.     Cervical back: Normal range of motion and neck supple.  Skin:    Gary: Skin is warm and dry.     Findings: No abrasion or rash.  Neurological:     Mental Status: He is alert and oriented to person, place, and time. Mental status is at baseline.     GCS: GCS eye subscore is 4. GCS verbal subscore is 5. GCS motor subscore is 6.     Cranial Nerves: Cranial nerves are intact. No cranial nerve deficit.     Sensory: No sensory deficit.     Motor: Motor function is intact.  Psychiatric:        Attention and Perception: Attention  normal.        Speech: Speech normal.        Behavior: Behavior normal.     ED Results / Procedures / Treatments   Labs (all labs ordered are listed, but only abnormal results are displayed) Labs Reviewed  CBC WITH DIFFERENTIAL/PLATELET  COMPREHENSIVE METABOLIC PANEL  LIPASE, BLOOD  ETHANOL    EKG None  Radiology DG Abd Portable 1V  Result Date: 09/30/2023 CLINICAL DATA:  Abdominal pain EXAM: PORTABLE ABDOMEN - 1 VIEW COMPARISON:  CT 09/25/23. FINDINGS: Gas seen in nondilated loops of small and large bowel. Scattered colonic stool. Air in the rectum. No obstruction. There is some motion on this portable supine radiograph. The diaphragm is clipped off the edge of the film. Overlapping cardiac leads. IMPRESSION: Nonspecific bowel gas pattern. Please correlate with the prior CT findings of pancreatitis Electronically Signed   By: Karen Kays M.D.   On: 09/30/2023 14:13    Procedures Procedures    Medications Ordered in ED Medications  lactated ringers infusion (has no administration in time range)  fentaNYL (SUBLIMAZE) injection 100 mcg (has no administration in time range)  lactated ringers bolus 1,000 mL (1,000 mLs Intravenous New Bag/Given 09/30/23 2155)    ED Course/ Medical Decision Making/ A&P                                 Medical Decision Making Amount and/or Complexity of Data Reviewed Labs: ordered. Radiology: ordered.  Risk Prescription drug management.   Patient given IV fluids here along with pain medication.  Concern for possible worsening pancreatitis.  Lipase is going down.  Alcohol level is 184.  Suspect patient likely has alcoholic gastritis.  Acute abdominal series shows no free air.  No indication for abdominal CT at this time.  Will discharge home with homeless resources.  Patient strongly encouraged to not use alcohol.  Will place on Pepcid and/or PPI with patient on that        Final Clinical Impression(s) / ED Diagnoses Final diagnoses:   None    Rx / DC Orders ED Discharge Orders     None         Lorre Nick, MD 09/30/23 2313

## 2023-09-30 NOTE — Discharge Instructions (Addendum)
Gary [name],  You were in the hospital   Patient given list of [facility]  FOOD PANTRY [employer] [address] [phone]  [facility] [employer] [address] [place] [phone]  [facility] - Food Distribution Center [address] [place] [phone]  [facility] [employer] [address] [phone]  [facility] - Food Distribution Center [address] [place], [place] [address] [phone]

## 2023-09-30 NOTE — Discharge Summary (Addendum)
Physician Discharge Summary   Patient: Gary [name] MRN: [id number] DOB: 1986/04/28  Admit date:     09/24/2023  Discharge date: 09/30/2023  Discharge Physician: Jacquelin Hawking, MD   PCP: Patient, No Pcp Per   Recommendations at discharge:  Establish with PCP Complete alcohol cessation Low fat diet  Discharge Diagnoses: Principal Problem:   Acute alcoholic pancreatitis Active Problems:   Nicotine dependence, cigarettes, uncomplicated   Alcohol use disorder, severe, dependence (HCC)   HIV (human immunodeficiency virus infection) (HCC)   Alcohol abuse   Hypokalemia   Seizure disorder (HCC)   Alcohol withdrawal syndrome without complication (HCC)   Hypomagnesemia   Alcoholic fatty liver   Macrocytic anemia  Resolved Problems:   * No resolved hospital problems. [facility] Course: Gary [name] is a 37 y.o. male with a history of polysubstance abuse, HIV, seizures, bipolar 2 disorder, PTSD, schizophrenia, alcohol induced pancreatitis.  Patient presented secondary to severe abdominal pain and found to have evidence of recurrent alcoholic pancreatitis. Bowel rest and analgesic therapy initiated. CT imaging significant for uncomplicated pancreatitis. Hospitalization complicated by alcohol withdrawal requiring phenobarbital with taper. Diet advanced.  Assessment and Plan:  Acute alcoholic pancreatitis Patient with pain, lipase of 625 and CT evidence consistent with acute pancreatitis related to alcohol abuse. Patient  managed with bowel rest, IV fluids and opioid analgesics. Diet was advanced successfully with patient eating 100% of his meal several times. Patient was counseled extensively on need to cease alcohol intake as this will cause recurrent episodes. High bounce back risk secondary to continued alcohol use.   Alcohol withdrawal syndrome Alcohol abuse Patient managed with CIWA, including phenobarbital with taper. Significant withdrawal resolved. Counseled on alcohol  intake cessation. TOC consulted for resources.   HIV Patient was previously prescribed Biktarvy but is not currently taking this regimen. Biktarvy not resumed on admission.   Hypomagnesemia Supplementation given. Resolved.   Hypokalemia Supplementation given. Resolved.   Alcoholic fatty liver Noted on imaging. No evidence of cirrhosis noted.   Nicotine dependence Noted. Given nicotine patches while inpatient.   Seizure disorder Continue Keppra.   Macrocytic anemia Folate and vitamin B12 normal.   Consultants: None Procedures performed: None  Disposition: Home Diet recommendation: Low fat   DISCHARGE MEDICATION: Allergies as of 09/30/2023       Reactions   Tegretol [carbamazepine] Other (See Comments)   Caused vertigo for 2 days after taking it   Caffeine Palpitations        Medication List     TAKE these medications    Biktarvy 50-200-25 MG Tabs tablet Generic drug: bictegravir-emtricitabine-tenofovir AF Take 1 tablet by mouth daily.   folic acid 1 MG tablet Commonly known as: FOLVITE Take 1 tablet (1 mg total) by mouth daily.   HYDROcodone-acetaminophen 5-325 MG tablet Commonly known as: NORCO/VICODIN Take 1 tablet by mouth every 4 (four) hours as needed for up to 3 days for severe pain (pain score 7-10).   levETIRAcetam 500 MG tablet Commonly known as: KEPPRA Take 1 tablet (500 mg total) by mouth 2 (two) times daily.   polyethylene glycol powder 17 GM/SCOOP powder Commonly known as: MiraLax Take 17 g by mouth daily.   thiamine 100 MG tablet Commonly known as: VITAMIN B1 Take 1 tablet (100 mg total) by mouth daily.        Discharge Exam: BP 117/74 (BP Location: Right Arm)   Pulse 89   Temp 98.3 F (36.8 C) (Oral)   Resp 15   Ht 6' (  1.829 m)   Wt 65.1 kg   SpO2 100%   BMI 19.46 kg/m   General exam: Appears calm and comfortable Respiratory system: Clear to auscultation. Respiratory effort normal. Cardiovascular system: S1 & S2  heard, RRR. No murmurs. Gastrointestinal system: Abdomen is non-distended and soft. Normal bowel sounds heard. Central nervous system: Alert and oriented. No focal neurological deficits. Musculoskeletal: No edema. No calf tenderness Psychiatry: Judgement and insight appear normal. Mood & affect appropriate.   Condition at discharge: stable  The results of significant diagnostics from this hospitalization (including imaging, microbiology, ancillary and laboratory) are listed below for reference.   Imaging Studies: DG Abd Portable 1V  Result Date: 09/30/2023 CLINICAL DATA:  Abdominal pain EXAM: PORTABLE ABDOMEN - 1 VIEW COMPARISON:  CT 09/25/23. FINDINGS: Gas seen in nondilated loops of small and large bowel. Scattered colonic stool. Air in the rectum. No obstruction. There is some motion on this portable supine radiograph. The diaphragm is clipped off the edge of the film. Overlapping cardiac leads. IMPRESSION: Nonspecific bowel gas pattern. Please correlate with the prior CT findings of pancreatitis Electronically Signed   By: Karen Kays M.D.   On: 09/30/2023 14:13   CT ABDOMEN PELVIS W CONTRAST  Result Date: 09/25/2023 CLINICAL DATA:  Epigastric pain EXAM: CT ABDOMEN AND PELVIS WITH CONTRAST TECHNIQUE: Multidetector CT imaging of the abdomen and pelvis was performed using the standard protocol following bolus administration of intravenous contrast. RADIATION DOSE REDUCTION: This exam was performed according to the departmental dose-optimization program which includes automated exposure control, adjustment of the mA and/or kV according to patient size and/or use of iterative reconstruction technique. CONTRAST:  OMNIPAQUE IOHEXOL 300 MG/ML  SOLN COMPARISON:  09/16/2023 FINDINGS: Lower chest: Elevation of the left hemidiaphragm, stable. No acute findings. Hepatobiliary: Diffuse low-density throughout the liver compatible with fatty infiltration. No focal abnormality. Gallbladder  unremarkable. Common bile duct prominent at 8 mm, similar to prior study. Pancreas: Truncation of the pancreatic tail. Enlargement of the pancreatic head with surrounding peripancreatic edema. Findings are similar to prior study. Spleen: No focal abnormality.  Normal size. Adrenals/Urinary Tract: No adrenal abnormality. No focal renal abnormality. No stones or hydronephrosis. Urinary bladder is unremarkable. Stomach/Bowel: Normal appendix. Stomach, large and small bowel grossly unremarkable. Vascular/Lymphatic: No evidence of aneurysm or adenopathy. Reproductive: No visible focal abnormality. Other: No free fluid or free air. Musculoskeletal: No acute bony abnormality IMPRESSION: Continued enlargement of the pancreatic head with surrounding inflammation compatible with acute pancreatitis Hepatic steatosis. Electronically Signed   By: Charlett Nose M.D.   On: 09/25/2023 02:34   DG CHEST PORT 1 VIEW  Result Date: 08/31/2023 CLINICAL DATA:  Cough EXAM: PORTABLE CHEST 1 VIEW COMPARISON:  Chest x-ray 08/28/2023 FINDINGS: There stable elevation of the left hemidiaphragm. There some strandy opacities in the lung bases, right greater than left. There is a possible small right pleural effusion. The cardiomediastinal silhouette is stable. There is no pneumothorax or acute fracture. IMPRESSION: 1. Strandy opacities in the lung bases, right greater than left, likely atelectasis. 2. Possible small right pleural effusion. Electronically Signed   By: Darliss Cheney M.D.   On: 08/31/2023 23:24    Microbiology: Results for orders placed or performed during the hospital encounter of 08/22/23  MRSA Next Gen by PCR, Nasal     Status: None   Collection Time: 08/23/23  4:08 AM   Specimen: Nasal Mucosa; Nasal Swab  Result Value Ref Range Status   MRSA by PCR Next Gen NOT DETECTED NOT DETECTED  Final    Comment: (NOTE) The GeneXpert MRSA Assay (FDA approved for NASAL specimens only), is one component of a comprehensive MRSA  colonization surveillance program. It is not intended to diagnose MRSA infection nor to guide or monitor treatment for MRSA infections. Test performance is not FDA approved in patients less than 57 years old. Performed at [facility], [address], [place], [place] [address]   Culture, blood (x 2)     Status: None   Collection Time: 08/28/23  8:43 PM   Specimen: BLOOD RIGHT HAND  Result Value Ref Range Status   Specimen Description   Final    BLOOD RIGHT HAND Performed at [facility], [address], [place], [place] [address]    Special Requests   Final    BOTTLES DRAWN AEROBIC ONLY Blood Culture adequate volume Performed at [facility], [address] [place], [place] [address]    Culture   Final    NO GROWTH 5 DAYS Performed at [facility], [address], [place], [place] [address]    Report Status 09/02/2023 FINAL  Final  Culture, blood (x 2)     Status: None   Collection Time: 08/28/23  8:43 PM   Specimen: BLOOD RIGHT HAND  Result Value Ref Range Status   Specimen Description   Final    BLOOD RIGHT HAND Performed at [facility], [address] [place], [place] [address]    Special Requests   Final    BOTTLES DRAWN AEROBIC ONLY Blood Culture adequate volume Performed at [facility], [address], [place], [place] [address]    Culture   Final    NO GROWTH 5 DAYS Performed at [facility], [address] [place], [place] [address]    Report Status 09/02/2023 FINAL  Final    Labs: CBC: Recent Labs  Lab 09/25/23 0003 09/26/23 0311 09/27/23 0252 09/28/23 0252  WBC 4.8 2.6* 2.8* 4.4  HGB 13.7 11.0* 12.1* 13.6  HCT 43.6 32.7* 38.5* 41.6  MCV 105.1* 101.2* 103.8* 101.2*  PLT 390 315 352 479*   Basic Metabolic Panel: Recent Labs  Lab 09/25/23 0003 09/26/23 0311 09/26/23 0800 09/27/23 0252 09/28/23 0252  NA 138 136  --  136 135  K 2.7* 3.5  --  4.0 4.4  CL 104  106  --  102 99  CO2 23 25  --  27 27  GLUCOSE 108* 123*  --  98 97  BUN 8 <5*  --  <5* <5*  CREATININE 0.52* 0.50*  --  0.56* 0.61  CALCIUM 8.7* 8.3*  --  8.9 9.5  MG 2.4  --  1.5* 2.4 2.1  PHOS 4.3  --  3.6 4.5 5.0*   Liver Function Tests: Recent Labs  Lab 09/25/23 0003 09/26/23 0311 09/27/23 0252 09/29/23 0302  AST 62* 35 25 16  ALT 60* 41 36 25  ALKPHOS 102 74 82 85  BILITOT 0.4 0.7 0.5 0.5  PROT 7.6 5.7* 6.6 6.8  ALBUMIN 3.8 2.8* 3.3* 3.2*   CBG: Recent Labs  Lab 09/29/23 2350 09/30/23 0349 09/30/23 0754 09/30/23 1133 09/30/23 1526  GLUCAP 97 84 72 109* 114*    Discharge time spent: 35 minutes.  Signed: Jacquelin Hawking, MD [facility] 09/30/2023

## 2023-09-30 NOTE — ED Triage Notes (Signed)
BIBA c/o generalized abd pain with n/v/d.  Hx pancreatitis.  Pt reports ETOH usage 2 hours PTA.  Pt reports recently discharged from hospital for the same.

## 2023-09-30 NOTE — TOC Transition Note (Signed)
Transition of Care [facility]) - CM/SW Discharge Note   Patient Details  Name: RAYVON CURLEY MRN: [id number] Date of Birth: 08-07-1986  [facility] CM/SW Contact:  Adrian Prows, RN Phone Number: 09/30/2023, 3:50 PM   Clinical Narrative:    TOC for d/c planning; spoke w/ pt in room; pt says he is homeless; pt says he does not have a PCP; he also says he does not have transportation; pt says he does not want to go to a shelter; pt verified his POC Jamarcus Perteet (mother) [phone]; he verified his insurance; pt is not a MATCH candidate as he has insurance; pt declines this RN CM assisting him to obtain appt for PCP; pt given resources for [employer], OP/IP substance abuse, financial assistance, transportation, shelters, and social services; also list of additional food resources placed in d/c instructions; pt will call agencies of choice; bus pass given to unit secretary Lauren as pt's nurse is at lunch; she will give pass to pt's nurse Ethan; no TOC needs.   Final next level of care: Home/Self Care Barriers to Discharge: No Barriers Identified   Patient Goals and CMS Choice   Choice offered to / list presented to : NA  Discharge Placement                         Discharge Plan and Services Additional resources added to the After Visit Summary for   In-house Referral: Clinical Social Work   Post Acute Care Choice: NA          DME Arranged: N/A DME Agency: NA                  Social Determinants of Health (SDOH) Interventions SDOH Screenings   Food Insecurity: Food Insecurity Present (09/30/2023)  Housing: High Risk (09/30/2023)  Transportation Needs: Unmet Transportation Needs (09/30/2023)  Utilities: Not At Risk (09/30/2023)  Alcohol Screen: High Risk (07/25/2021)  Depression (PHQ2-9): Medium Risk (02/12/2021)  Financial Resource Strain: High Risk (06/30/2022)   Received from [facility]  Social Connections:  Unknown (06/24/2022)   Received from [facility], [facility]  Stress: No Stress Concern Present (06/30/2022)   Received from [facility], [facility]  Recent Concern: Stress - Stress Concern Present (06/25/2022)   Received from [facility]  Tobacco Use: High Risk (09/24/2023)     Readmission Risk Interventions    09/30/2023    3:35 PM 09/26/2023    2:46 PM 08/23/2023    6:11 PM  Readmission Risk Prevention Plan  Transportation Screening Complete Complete Complete  Medication Review Oceanographer) Complete Complete Complete  PCP or Specialist appointment within 3-5 days of discharge Patient refused  Complete  HRI or Home Care Consult Complete Complete Complete  SW Recovery Care/Counseling Consult Complete Complete Complete  Palliative Care Screening Not Applicable Not Applicable Not Applicable  Skilled Nursing Facility Not Applicable Not Applicable Not Applicable

## 2023-10-01 ENCOUNTER — Other Ambulatory Visit (HOSPITAL_COMMUNITY): Payer: Self-pay

## 2023-10-01 ENCOUNTER — Encounter (HOSPITAL_COMMUNITY): Payer: Self-pay | Admitting: Emergency Medicine

## 2023-10-01 ENCOUNTER — Other Ambulatory Visit: Payer: Self-pay

## 2023-10-01 ENCOUNTER — Inpatient Hospital Stay (HOSPITAL_COMMUNITY)
Admission: EM | Admit: 2023-10-01 | Discharge: 2023-10-03 | DRG: 439 | Disposition: A | Discharge status: Home / Self Care | Payer: MEDICAID | Attending: Family Medicine | Admitting: Family Medicine

## 2023-10-01 DIAGNOSIS — Z5901 Sheltered homelessness: Secondary | ICD-10-CM

## 2023-10-01 DIAGNOSIS — Y906 Blood alcohol level of 120-199 mg/100 ml: Secondary | ICD-10-CM | POA: Diagnosis present

## 2023-10-01 DIAGNOSIS — E876 Hypokalemia: Secondary | ICD-10-CM | POA: Diagnosis present

## 2023-10-01 DIAGNOSIS — F3181 Bipolar II disorder: Secondary | ICD-10-CM | POA: Diagnosis present

## 2023-10-01 DIAGNOSIS — F17210 Nicotine dependence, cigarettes, uncomplicated: Secondary | ICD-10-CM | POA: Diagnosis present

## 2023-10-01 DIAGNOSIS — T426X6A Underdosing of other antiepileptic and sedative-hypnotic drugs, initial encounter: Secondary | ICD-10-CM | POA: Diagnosis present

## 2023-10-01 DIAGNOSIS — B20 Human immunodeficiency virus [HIV] disease: Secondary | ICD-10-CM | POA: Diagnosis present

## 2023-10-01 DIAGNOSIS — F209 Schizophrenia, unspecified: Secondary | ICD-10-CM | POA: Diagnosis present

## 2023-10-01 DIAGNOSIS — K852 Alcohol induced acute pancreatitis without necrosis or infection: Secondary | ICD-10-CM | POA: Diagnosis not present

## 2023-10-01 DIAGNOSIS — Z79899 Other long term (current) drug therapy: Secondary | ICD-10-CM

## 2023-10-01 DIAGNOSIS — Z91048 Other nonmedicinal substance allergy status: Secondary | ICD-10-CM

## 2023-10-01 DIAGNOSIS — R569 Unspecified convulsions: Secondary | ICD-10-CM | POA: Diagnosis present

## 2023-10-01 DIAGNOSIS — F4310 Post-traumatic stress disorder, unspecified: Secondary | ICD-10-CM | POA: Diagnosis present

## 2023-10-01 DIAGNOSIS — Z91138 Patient's unintentional underdosing of medication regimen for other reason: Secondary | ICD-10-CM

## 2023-10-01 DIAGNOSIS — Z800 Family history of malignant neoplasm of digestive organs: Secondary | ICD-10-CM

## 2023-10-01 DIAGNOSIS — Z888 Allergy status to other drugs, medicaments and biological substances status: Secondary | ICD-10-CM

## 2023-10-01 DIAGNOSIS — F1020 Alcohol dependence, uncomplicated: Secondary | ICD-10-CM | POA: Diagnosis present

## 2023-10-01 DIAGNOSIS — Z811 Family history of alcohol abuse and dependence: Secondary | ICD-10-CM

## 2023-10-01 DIAGNOSIS — T375X6A Underdosing of antiviral drugs, initial encounter: Secondary | ICD-10-CM | POA: Diagnosis present

## 2023-10-01 LAB — COMPREHENSIVE METABOLIC PANEL
ALT: 21 U/L (ref 0–44)
AST: 20 U/L (ref 15–41)
Albumin: 3.7 g/dL (ref 3.5–5.0)
Alkaline Phosphatase: 80 U/L (ref 38–126)
Anion gap: 11 (ref 5–15)
BUN: 8 mg/dL (ref 6–20)
CO2: 23 mmol/L (ref 22–32)
Calcium: 8.8 mg/dL — ABNORMAL LOW (ref 8.9–10.3)
Chloride: 101 mmol/L (ref 98–111)
Creatinine, Ser: 0.65 mg/dL (ref 0.61–1.24)
GFR, Estimated: >60 mL/min (ref >60)
Glucose, Bld: 122 mg/dL — ABNORMAL HIGH (ref 70–99)
Potassium: 3.5 mmol/L (ref 3.5–5.1)
Sodium: 135 mmol/L (ref 135–145)
Total Bilirubin: 0.4 mg/dL (ref <1.2)
Total Protein: 7.3 g/dL (ref 6.5–8.1)

## 2023-10-01 LAB — CBC WITH DIFFERENTIAL/PLATELET
Abs Immature Granulocytes: 0.01 10*3/uL (ref 0.00–0.07)
Basophils Absolute: 0.1 10*3/uL (ref 0.0–0.1)
Basophils Relative: 1 %
Eosinophils Absolute: 0.1 10*3/uL (ref 0.0–0.5)
Eosinophils Relative: 1 %
HCT: 38.8 % — ABNORMAL LOW (ref 39.0–52.0)
Hemoglobin: 13.1 g/dL (ref 13.0–17.0)
Immature Granulocytes: 0 %
Lymphocytes Relative: 31 %
Lymphs Abs: 1.4 10*3/uL (ref 0.7–4.0)
MCH: 33.3 pg (ref 26.0–34.0)
MCHC: 33.8 g/dL (ref 30.0–36.0)
MCV: 98.7 fL (ref 80.0–100.0)
Monocytes Absolute: 0.3 10*3/uL (ref 0.1–1.0)
Monocytes Relative: 7 %
Neutro Abs: 2.8 10*3/uL (ref 1.7–7.7)
Neutrophils Relative %: 60 %
Platelets: 420 10*3/uL — ABNORMAL HIGH (ref 150–400)
RBC: 3.93 MIL/uL — ABNORMAL LOW (ref 4.22–5.81)
RDW: 14.4 % (ref 11.5–15.5)
WBC: 4.6 10*3/uL (ref 4.0–10.5)
nRBC: 0 % (ref 0.0–0.2)

## 2023-10-01 LAB — ETHANOL: Alcohol, Ethyl (B): 161 mg/dL — ABNORMAL HIGH (ref <10)

## 2023-10-01 LAB — LACTIC ACID, PLASMA
Lactic Acid, Venous: 1.8 mmol/L (ref 0.5–1.9)
Lactic Acid, Venous: 0.6 mmol/L (ref 0.5–1.9)

## 2023-10-01 LAB — LIPASE, BLOOD: Lipase: 533 U/L — ABNORMAL HIGH (ref 11–51)

## 2023-10-01 MED ORDER — ACETAMINOPHEN 650 MG RE SUPP: 650.0000 mg | Freq: Four times a day (QID) | RECTAL | Status: DC | PRN

## 2023-10-01 MED ORDER — SODIUM CHLORIDE 0.9 % IV BOLUS
1000.0000 mL | Freq: Once | INTRAVENOUS | Status: AC
Start: 2023-10-01 — End: 2023-10-01
  Administered 2023-10-01: 1000 mL via INTRAVENOUS

## 2023-10-01 MED ORDER — PANTOPRAZOLE SODIUM 40 MG IV SOLR
80.0000 mg | Freq: Once | INTRAVENOUS | Status: AC
  Administered 2023-10-01: 80 mg via INTRAVENOUS
  Filled 2023-10-01: qty 20

## 2023-10-01 MED ORDER — CHLORDIAZEPOXIDE HCL 25 MG PO CAPS
25.0000 mg | ORAL_CAPSULE | Freq: Four times a day (QID) | ORAL | Status: AC
  Administered 2023-10-01 (×4): 25 mg via ORAL
  Filled 2023-10-01 (×4): qty 1

## 2023-10-01 MED ORDER — THIAMINE HCL 100 MG/ML IJ SOLN: 100.0000 mg | Freq: Every day | INTRAMUSCULAR | Status: DC

## 2023-10-01 MED ORDER — CHLORDIAZEPOXIDE HCL 25 MG PO CAPS
25.0000 mg | ORAL_CAPSULE | ORAL | Status: DC
  Administered 2023-10-03: 25 mg via ORAL
  Filled 2023-10-01: qty 1

## 2023-10-01 MED ORDER — SODIUM CHLORIDE 0.9 % IV BOLUS
1000.0000 mL | Freq: Once | INTRAVENOUS | Status: AC
  Administered 2023-10-01: 1000 mL via INTRAVENOUS

## 2023-10-01 MED ORDER — CHLORDIAZEPOXIDE HCL 25 MG PO CAPS
25.0000 mg | ORAL_CAPSULE | Freq: Three times a day (TID) | ORAL | Status: AC
  Administered 2023-10-02 (×3): 25 mg via ORAL
  Filled 2023-10-01 (×3): qty 1

## 2023-10-01 MED ORDER — MORPHINE SULFATE (PF) 2 MG/ML IV SOLN
1.0000 mg | INTRAVENOUS | Status: DC | PRN
  Administered 2023-10-01 – 2023-10-03 (×14): 1 mg via INTRAVENOUS
  Filled 2023-10-01 (×14): qty 1

## 2023-10-01 MED ORDER — LOPERAMIDE HCL 2 MG PO CAPS: 2.0000 mg | ORAL_CAPSULE | ORAL | Status: DC | PRN

## 2023-10-01 MED ORDER — ONDANSETRON HCL 4 MG PO TABS: 4.0000 mg | ORAL_TABLET | Freq: Four times a day (QID) | ORAL | Status: DC | PRN

## 2023-10-01 MED ORDER — TRAZODONE HCL 50 MG PO TABS
25.0000 mg | ORAL_TABLET | Freq: Every evening | ORAL | Status: DC | PRN
  Administered 2023-10-01 – 2023-10-02 (×2): 25 mg via ORAL
  Filled 2023-10-01 (×2): qty 1

## 2023-10-01 MED ORDER — MORPHINE SULFATE (PF) 2 MG/ML IV SOLN
2.0000 mg | INTRAVENOUS | Status: DC | PRN
  Administered 2023-10-01: 2 mg via INTRAVENOUS
  Filled 2023-10-01: qty 1

## 2023-10-01 MED ORDER — OXYCODONE HCL 5 MG PO TABS
5.0000 mg | ORAL_TABLET | ORAL | Status: DC | PRN
  Administered 2023-10-01 – 2023-10-03 (×8): 5 mg via ORAL
  Filled 2023-10-01 (×9): qty 1

## 2023-10-01 MED ORDER — FOLIC ACID 1 MG PO TABS
1.0000 mg | ORAL_TABLET | Freq: Every day | ORAL | Status: DC
  Administered 2023-10-01 – 2023-10-03 (×3): 1 mg via ORAL
  Filled 2023-10-01 (×3): qty 1

## 2023-10-01 MED ORDER — ONDANSETRON HCL 4 MG/2ML IJ SOLN
4.0000 mg | Freq: Four times a day (QID) | INTRAMUSCULAR | Status: DC | PRN
  Administered 2023-10-01: 4 mg via INTRAVENOUS
  Filled 2023-10-01: qty 2

## 2023-10-01 MED ORDER — SODIUM CHLORIDE 0.9 % IV SOLN: INTRAVENOUS | Status: AC

## 2023-10-01 MED ORDER — HYDROXYZINE HCL 25 MG PO TABS
25.0000 mg | ORAL_TABLET | Freq: Four times a day (QID) | ORAL | Status: DC | PRN
  Administered 2023-10-01 – 2023-10-02 (×2): 25 mg via ORAL
  Filled 2023-10-01 (×2): qty 1

## 2023-10-01 MED ORDER — ACETAMINOPHEN 325 MG PO TABS: 650.0000 mg | ORAL_TABLET | Freq: Four times a day (QID) | ORAL | Status: DC | PRN

## 2023-10-01 MED ORDER — THIAMINE MONONITRATE 100 MG PO TABS
100.0000 mg | ORAL_TABLET | Freq: Every day | ORAL | Status: DC
  Administered 2023-10-01 – 2023-10-03 (×3): 100 mg via ORAL
  Filled 2023-10-01 (×3): qty 1

## 2023-10-01 MED ORDER — OXYCODONE-ACETAMINOPHEN 5-325 MG PO TABS
1.0000 | ORAL_TABLET | Freq: Once | ORAL | Status: AC
  Administered 2023-10-01: 1 via ORAL
  Filled 2023-10-01: qty 1

## 2023-10-01 MED ORDER — LORAZEPAM 1 MG PO TABS
1.0000 mg | ORAL_TABLET | ORAL | Status: DC | PRN
Start: 2023-10-01 — End: 2023-10-04
  Administered 2023-10-01 – 2023-10-03 (×4): 1 mg via ORAL
  Filled 2023-10-01: qty 1
  Filled 2023-10-01: qty 2
  Filled 2023-10-01 (×2): qty 1

## 2023-10-01 MED ORDER — ONDANSETRON HCL 4 MG/2ML IJ SOLN
4.0000 mg | Freq: Once | INTRAMUSCULAR | Status: AC
  Administered 2023-10-01: 4 mg via INTRAVENOUS
  Filled 2023-10-01: qty 2

## 2023-10-01 MED ORDER — CHLORDIAZEPOXIDE HCL 25 MG PO CAPS: 25.0000 mg | ORAL_CAPSULE | Freq: Every day | ORAL | Status: DC

## 2023-10-01 MED ORDER — MORPHINE SULFATE (PF) 4 MG/ML IV SOLN
4.0000 mg | Freq: Once | INTRAVENOUS | Status: AC
  Administered 2023-10-01: 4 mg via INTRAVENOUS
  Filled 2023-10-01: qty 1

## 2023-10-01 MED ORDER — ADULT MULTIVITAMIN W/MINERALS CH
1.0000 | ORAL_TABLET | Freq: Every day | ORAL | Status: DC
  Administered 2023-10-01 – 2023-10-03 (×2): 1 via ORAL
  Filled 2023-10-01 (×2): qty 1

## 2023-10-01 MED ORDER — ALBUTEROL SULFATE (2.5 MG/3ML) 0.083% IN NEBU: 2.5000 mg | INHALATION_SOLUTION | RESPIRATORY_TRACT | Status: DC | PRN

## 2023-10-01 MED ORDER — LEVETIRACETAM 500 MG PO TABS
500.0000 mg | ORAL_TABLET | Freq: Two times a day (BID) | ORAL | Status: DC
  Administered 2023-10-01 – 2023-10-03 (×5): 500 mg via ORAL
  Filled 2023-10-01 (×5): qty 1

## 2023-10-01 MED ORDER — ENOXAPARIN SODIUM 40 MG/0.4ML IJ SOSY
40.0000 mg | PREFILLED_SYRINGE | INTRAMUSCULAR | Status: DC
  Administered 2023-10-01 – 2023-10-02 (×2): 40 mg via SUBCUTANEOUS
  Filled 2023-10-01 (×2): qty 0.4

## 2023-10-01 NOTE — ED Triage Notes (Signed)
Pt reports abd pain. Was seen & d/c earlier tonight

## 2023-10-01 NOTE — ED Notes (Signed)
ED TO INPATIENT HANDOFF REPORT  Name/Age/Gender Gary [name] 37 y.o. male  Code Status    Code Status Orders  (From admission, onward)           Start     Ordered   10/01/23 0749  Full code  Continuous       Question:  By:  Answer:  Consent: discussion documented in EHR   10/01/23 0750           Code Status History     Date Active Date Inactive Code Status Order ID Comments User Context   09/25/2023 0437 09/30/2023 2027 Full Code [id number]  Angie Fava, DO ED   08/22/2023 2149 09/01/2023 1905 Full Code [id number]  Charlsie Quest, MD ED   07/16/2023 2225 07/19/2023 0025 Full Code [id number]  Anselm Jungling, DO ED   06/11/2023 0742 06/15/2023 1947 Full Code [id number]  Maryln Gottron, MD ED   05/24/2023 1817 05/29/2023 2224 Full Code [id number]  Lurline Del, MD ED   12/30/2022 2215 12/31/2022 1512 Full Code [id number]  [name], PA-C ED   12/23/2022 0422 12/26/2022 1044 Full Code [id number]  Tim Lair, PA-C ED   12/14/2022 1532 12/22/2022 1941 Full Code [id number]  Teddy Spike, DO ED   11/07/2022 2321 11/08/2022 1522 Full Code [id number]  [name], PA-C ED   10/16/2022 2251 10/23/2022 1712 Full Code [id number]  Charlsie Quest, MD ED   10/16/2022 1707 10/16/2022 1800 Full Code [id number]  Jackelyn Poling, NP Inpatient   09/11/2022 2015 09/13/2022 1644 Full Code [id number]  Belva Agee, MD ED   06/18/2022 2328 06/19/2022 2240 Full Code [id number]  Gilles Chiquito, MD ED   06/11/2022 2314 06/12/2022 1121 Full Code [id number]  Shon Baton, MD ED   05/28/2022 0346 06/01/2022 1913 Full Code [id number]  Carollee Herter, DO ED   05/20/2022 2019 05/21/2022 1727 Full Code [id number]  Ernie Avena, MD ED   04/25/2022 2012 05/03/2022 1757 Full Code [id number]  Darlin Drop, DO ED   04/16/2022 1415 04/23/2022 1911 Full Code [id number]  Clydie Braun, MD ED   04/16/2022 1033 04/16/2022 1415 Full Code [id number]  Jeannie Fend, PA-C ED   01/20/2022 0849 01/27/2022 1949 Full Code  [id number]  Clydie Braun, MD ED   01/20/2022 0440 01/20/2022 0849 Full Code [id number]  Carroll Sage, PA-C ED   12/31/2021 0738 01/06/2022 2055 Full Code [id number]  Bobette Mo, MD ED   12/31/2021 0736 12/31/2021 0738 Full Code [id number]  Bobette Mo, MD ED   12/31/2021 0053 12/31/2021 0735 Full Code [id number]  Shon Baton, MD ED   11/06/2021 1540 11/08/2021 2241 Full Code [id number]  Janeann Forehand D, NP ED   11/06/2021 0200 11/06/2021 1540 Full Code [id number]  Paula Libra, MD ED   09/23/2021 0025 09/23/2021 1818 Full Code [id number]  Alvira Monday, MD ED   09/05/2021 2238 09/06/2021 1111 Full Code [id number]  Gerhard Munch, MD ED   09/05/2021 1507 09/05/2021 2238 Full Code [id number]  Samson Frederic, Cortni Kathie Rhodes, PA-C ED   08/26/2021 1556 08/30/2021 1846 Full Code [id number]  Estella Husk, MD ED   08/25/2021 2252 08/26/2021 1545 Full Code [id number]  Elpidio Anis, PA-C ED   07/26/2021 0108 07/31/2021 1948 Full Code [id number]  Loletta Parish, NP Inpatient   07/24/2021 2309 07/26/2021 0045 Full Code [id number]  Henderly, Britni A, PA-C ED   07/11/2021 2129 07/13/2021 1757 Full Code [id number]  Linwood Dibbles, MD  ED   07/08/2021 0519 07/08/2021 1134 Full Code [id number]  Shon Baton, MD ED   06/28/2021 2319 06/29/2021 0621 Full Code [id number]  Maia Plan, MD ED   04/30/2021 0905 05/01/2021 1150 Full Code [id number]  Teddy Spike, DO Inpatient   04/23/2021 1201 04/23/2021 2331 Full Code [id number]  Jeannie Fend, PA-C ED   04/07/2021 2151 04/08/2021 1516 Full Code [id number]  Jackelyn Poling, NP ED   04/06/2021 2256 04/07/2021 1108 Full Code [id number]  Linwood Dibbles, MD ED   03/11/2021 1520 03/19/2021 1925 Full Code [id number]  Eliezer Bottom, MD ED   03/08/2021 0701 03/09/2021 1757 Full Code [id number]  Petrucelli, Pleas Koch, PA-C ED   03/06/2021 2129 03/07/2021 0952 Full Code [id number]  Sponseller, Eugene Gavia, PA-C ED   02/24/2021 0116 02/24/2021 1107 Full Code [id number]  Petrucelli,  Pleas Koch, PA-C ED   02/13/2021 0041 02/13/2021 1245 Full Code [id number]  Milagros Loll, MD ED   02/11/2021 1916 02/12/2021 1555 Full Code [id number]  Samson Frederic, Cortni S, PA-C ED   01/11/2021 2100 01/12/2021 1150 Full Code [id number]  Gerhard Munch, MD ED   12/30/2020 1949 12/31/2020 1257 Full Code [id number]  Chesley Noon, MD ED   12/29/2020 2000 12/30/2020 1235 Full Code [id number]  Sharman Cheek, MD ED   12/09/2020 0420 12/09/2020 1534 Full Code [id number]  Eduard Clos, MD ED   12/09/2020 0131 12/09/2020 0420 Full Code [id number]  Roxy Horseman, PA-C ED   12/05/2020 1448 12/08/2020 1301 Full Code [id number]  Leeroy Bock, DO ED   12/05/2020 1200 12/05/2020 1448 Full Code [id number]  Horton, Clabe Seal, DO ED   10/20/2020 0016 11/09/2020 1546 Full Code [id number]  Irean Hong, MD ED   10/19/2020 0345 10/19/2020 2211 Full Code [id number]  Andris Baumann, MD ED   10/18/2020 2122 10/19/2020 0345 Full Code [id number]  Merwyn Katos, MD ED   10/07/2020 1127 10/08/2020 2023 Full Code [id number]  Chesley Noon, MD ED   09/28/2020 0324 09/29/2020 1457 Full Code [id number]  Shaune Pollack, MD ED   09/26/2020 0950 09/27/2020 1521 Full Code [id number]  Minna Antis, MD ED   09/26/2020 0728 09/26/2020 0950 Full Code [id number]  Nita Sickle, MD ED   07/04/2020 1040 07/05/2020 2139 Full Code [id number]  Jerald Kief, MD ED   06/23/2020 2240 06/25/2020 1923 Full Code [id number]  Jeannie Fend, PA-C ED   06/14/2020 0003 06/18/2020 1749 Full Code [id number]  Venora Maples, MD ED   06/09/2020 1108 06/09/2020 2025 Full Code [id number]  Lanae Boast, MD ED   06/08/2020 1548 06/08/2020 1942 Full Code [id number]  Jackelyn Poling, NP Inpatient   06/07/2020 1758 06/08/2020 1543 Full Code [id number]  Little, Ambrose Finland, MD ED   05/05/2020 0254 05/09/2020 1715 Full Code [id number]  Hillary Bow, DO ED   04/17/2020 2319 04/19/2020 0306 Full Code [id number]  Charlsie Quest, MD ED   04/17/2020 1915 04/17/2020 2319 Full Code  [id number]  Henderly, Britni A, PA-C ED   07/25/2019 1008 07/30/2019 2058 Full Code [id number]  Clydie Braun, MD ED   07/02/2019 0905 07/03/2019 1445 Full Code [id number]  Clydie Braun, MD ED   05/12/2019 1839 05/13/2019 1500 Full Code [id number]  Fayrene Helper, PA-C ED   04/16/2019 1809 04/17/2019 1841 Full Code [id number]  Charm Rings, NP Inpatient   04/16/2019 1238 04/16/2019 1710 Full Code [id number]  Jeanmarie Plant, MD ED   03/31/2019 0028 03/31/2019 1449 Full Code [id number]  Arnaldo Natal, MD ED   06/15/2018 2015 06/19/2018 1742 Full Code [id number]  Enid Baas, MD Inpatient   09/24/2016 0429 09/25/2016 1406 Full Code [id number]  Arnaldo Natal Inpatient       Home/SNF/Other Home  Chief Complaint Acute alcoholic pancreatitis [K85.20]  Level of Care/Admitting Diagnosis ED Disposition     ED Disposition  Admit   Condition  --   Comment  Hospital Area: [facility] [[id number]]  Level of Care: Progressive [102]  Admit to Progressive based on following criteria: MULTISYSTEM THREATS such as stable sepsis, metabolic/electrolyte imbalance with or without encephalopathy that is responding to early treatment.  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   Anxiety    Bipolar 2 disorder (HCC)    HIV (human immunodeficiency virus infection) (HCC)    Pancreatitis    PTSD (post-traumatic stress disorder)    Schizophrenia (HCC)    Seizures (HCC)    Subdural hematoma (HCC)     Allergies Allergies  Allergen Reactions   Tegretol  [Carbamazepine] Other (See Comments)    Caused vertigo for 2 days after taking it   Caffeine Palpitations    IV Location/Drains/Wounds Patient Lines/Drains/Airways Status     Active Line/Drains/Airways     Name Placement date Placement time Site Days   Peripheral IV 09/30/23 20 G Anterior;Left Forearm 09/30/23  2150  Forearm  1   Peripheral IV 10/01/23 20 G 1" Anterior;Distal;Left Forearm 10/01/23  0455  Forearm  less than 1   Peripheral IV 10/01/23 20 G 1.88" Anterior;Left;Upper Arm 10/01/23  0516  Arm  less than 1            Labs/Imaging Results for orders placed or performed during the hospital encounter of 10/01/23 (from the past 48 hour(s))  Comprehensive metabolic panel     Status: Abnormal   Collection Time: 10/01/23  5:24 AM  Result Value Ref Range   Sodium 135 135 - 145 mmol/L   Potassium 3.5 3.5 - 5.1 mmol/L   Chloride 101 98 - 111 mmol/L   CO2 23 22 - 32 mmol/L   Glucose, Bld 122 (H) 70 - 99 mg/dL    Comment: Glucose reference range applies only to samples taken after fasting for at least 8 hours.   BUN 8 6 - 20 mg/dL   Creatinine, Ser 6.29 0.61 - 1.24 mg/dL   Calcium 8.8 (L) 8.9 - 10.3 mg/dL   Total Protein 7.3 6.5 - 8.1 g/dL   Albumin 3.7 3.5 - 5.0 g/dL   AST 20 15 - 41 U/L   ALT 21 0 - 44 U/L   Alkaline Phosphatase 80 38 - 126 U/L   Total Bilirubin 0.4 <1.2 mg/dL   GFR, Estimated >52 >84 mL/min    Comment: (NOTE) Calculated using the CKD-EPI Creatinine Equation (2021)    Anion gap 11 5 - 15    Comment: Performed at [facility], [address] [place], [place] [address]  Lipase, blood     Status: Abnormal   Collection Time: 10/01/23  5:24 AM  Result Value Ref Range   Lipase 533 (H) 11 - 51 U/L    Comment: RESULT CONFIRMED BY MANUAL DILUTION Performed at [facility], [address], [place], [place] [address]   CBC with Differential     Status: Abnormal   Collection Time: 10/01/23  5:24 AM  Result Value Ref  Range   WBC 4.6 4.0 - 10.5 K/uL   RBC 3.93 (L) 4.22 - 5.81 MIL/uL   Hemoglobin 13.1 13.0 - 17.0 g/dL   HCT 91.4 (L) 78.2 - 95.6 %   MCV 98.7 80.0 - 100.0 fL   MCH 33.3 26.0 - 34.0 pg   MCHC 33.8 30.0 - 36.0 g/dL   RDW 21.3 08.6 - 57.8 %   Platelets 420 (H) 150 - 400 K/uL   nRBC 0.0 0.0 - 0.2 %   Neutrophils Relative % 60 %   Neutro Abs 2.8 1.7 - 7.7 K/uL   Lymphocytes Relative 31 %   Lymphs Abs 1.4 0.7 - 4.0 K/uL   Monocytes Relative 7 %   Monocytes Absolute 0.3 0.1 - 1.0 K/uL   Eosinophils Relative 1 %   Eosinophils Absolute 0.1 0.0 - 0.5 K/uL   Basophils Relative 1 %   Basophils Absolute 0.1 0.0 - 0.1 K/uL   Immature Granulocytes 0 %   Abs Immature Granulocytes 0.01 0.00 - 0.07 K/uL    Comment: Performed at [facility], [address], [place], [place] [address]  Ethanol     Status: Abnormal   Collection Time: 10/01/23  5:24 AM  Result Value Ref Range   Alcohol, Ethyl (B) 161 (H) <10 mg/dL    Comment: (NOTE) Lowest detectable limit for serum alcohol is 10 mg/dL.  For medical purposes only. Performed at [facility], [address], [place], [place] [address]   Lactic acid, plasma     Status: None   Collection Time: 10/01/23  9:21 AM  Result Value Ref Range   Lactic Acid, Venous 1.8 0.5 - 1.9 mmol/L    Comment: Performed at [facility], [address] [place], [place] [address]   DG ABD ACUTE 2+V W 1V CHEST  Result Date: 09/30/2023 CLINICAL DATA:  Abdominal pain. EXAM: DG ABDOMEN ACUTE WITH 1 VIEW CHEST COMPARISON:  Radiographs earlier today, CT 09/25/2023 FINDINGS: Unchanged elevation of left hemidiaphragm. Left lung base atelectasis/scarring. No acute airspace disease. Normal heart size. No evidence of bowel obstruction or free air. Stable air throughout small bowel centrally, unchanged from earlier today. Small to moderate volume of stool in the colon. No acute osseous abnormalities are seen. IMPRESSION: 1. No  bowel obstruction or free air. Unchanged bowel-gas pattern from earlier today. 2. Unchanged elevation of left hemidiaphragm with left lung base atelectasis/scarring. Electronically Signed   By: Narda Rutherford M.D.   On: 09/30/2023 22:37   DG Abd Portable 1V  Result Date: 09/30/2023 CLINICAL DATA:  Abdominal pain EXAM: PORTABLE ABDOMEN - 1 VIEW COMPARISON:  CT 09/25/23. FINDINGS: Gas seen in nondilated loops of small and large bowel. Scattered colonic stool. Air in the rectum. No obstruction. There is some motion on this portable supine radiograph. The diaphragm is clipped off the edge of the film. Overlapping cardiac leads. IMPRESSION: Nonspecific bowel gas pattern. Please correlate with the prior CT findings of pancreatitis Electronically Signed   By: Karen Kays M.D.   On: 09/30/2023 14:13    Pending Labs [employer] (From admission, onward)     Start  Ordered   10/02/23 0500  Comprehensive metabolic panel  Tomorrow morning,   R        10/01/23 0750   10/02/23 0500  CBC  Tomorrow morning,   R        10/01/23 0750   10/01/23 0919  Lactic acid, plasma  (Lactic Acid)  STAT Now then every 3 hours,   R (with STAT occurrences)      10/01/23 0918            Vitals/Pain Today's Vitals   10/01/23 1156 10/01/23 1200 10/01/23 1217 10/01/23 1330  BP:  (!) 105/52  109/70  Pulse:  66  (!) 58  Resp:    17  Temp: 98.3 F (36.8 C)     TempSrc: Oral     SpO2:    96%  Weight:   65 kg   Height:   6' (1.829 m)   PainSc:        Isolation Precautions No active isolations  Medications Medications  enoxaparin (LOVENOX) injection 40 mg (40 mg Subcutaneous Given 10/01/23 1115)  0.9 %  sodium chloride infusion ( Intravenous New Bag/Given 10/01/23 0753)  acetaminophen (TYLENOL) tablet 650 mg (has no administration in time range)    Or  acetaminophen (TYLENOL) suppository 650 mg (has no administration in time range)  traZODone (DESYREL) tablet 25 mg (has no administration in time  range)  oxyCODONE (Oxy IR/ROXICODONE) immediate release tablet 5 mg (5 mg Oral Given 10/01/23 1408)  ondansetron (ZOFRAN) tablet 4 mg ( Oral See Alternative 10/01/23 1214)    Or  ondansetron (ZOFRAN) injection 4 mg (4 mg Intravenous Given 10/01/23 1214)  albuterol (PROVENTIL) (2.5 MG/3ML) 0.083% nebulizer solution 2.5 mg (has no administration in time range)  LORazepam (ATIVAN) tablet 1-4 mg (has no administration in time range)  thiamine (VITAMIN B1) tablet 100 mg (100 mg Oral Given 10/01/23 0911)    Or  thiamine (VITAMIN B1) injection 100 mg ( Intravenous See Alternative 10/01/23 0911)  folic acid (FOLVITE) tablet 1 mg (1 mg Oral Given 10/01/23 0911)  multivitamin with minerals tablet 1 tablet (1 tablet Oral Given 10/01/23 0911)  hydrOXYzine (ATARAX) tablet 25 mg (has no administration in time range)  loperamide (IMODIUM) capsule 2-4 mg (has no administration in time range)  chlordiazePOXIDE (LIBRIUM) capsule 25 mg (25 mg Oral Given 10/01/23 1408)    Followed by  chlordiazePOXIDE (LIBRIUM) capsule 25 mg (has no administration in time range)    Followed by  chlordiazePOXIDE (LIBRIUM) capsule 25 mg (has no administration in time range)    Followed by  chlordiazePOXIDE (LIBRIUM) capsule 25 mg (has no administration in time range)  levETIRAcetam (KEPPRA) tablet 500 mg (500 mg Oral Given 10/01/23 0911)  morphine (PF) 2 MG/ML injection 1 mg (has no administration in time range)  sodium chloride 0.9 % bolus 1,000 mL (0 mLs Intravenous Stopped 10/01/23 0651)  morphine (PF) 4 MG/ML injection 4 mg (4 mg Intravenous Given 10/01/23 0518)  ondansetron (ZOFRAN) injection 4 mg (4 mg Intravenous Given 10/01/23 0517)  pantoprazole (PROTONIX) injection 80 mg (80 mg Intravenous Given 10/01/23 0517)  oxyCODONE-acetaminophen (PERCOCET/ROXICET) 5-325 MG per tablet 1 tablet (1 tablet Oral Given 10/01/23 0651)  sodium chloride 0.9 % bolus 1,000 mL (0 mLs Intravenous Stopped 10/01/23 0910)  sodium chloride 0.9  % bolus 1,000 mL (0 mLs Intravenous Stopped 10/01/23 0947)    Mobility walks

## 2023-10-01 NOTE — ED Provider Notes (Signed)
Emergency Department Provider Note   I have reviewed the triage vital signs and the nursing notes.   HISTORY  Chief Complaint Abdominal Pain   HPI Gary [name] is a 37 y.o. male past history of alcohol abuse and pancreatitis returns to the emergency department with epigastric pain along with vomiting.  He was seen in the ER earlier today with similar symptoms.  Ultimately labs were reassuring and he was discharged.  He states he left but pain worsened and vomiting returned.  Denies fevers.  No blood in the emesis or stool.  Denies drug use.   Past Medical History:  Diagnosis Date   Alcohol abuse    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   Anxiety    Bipolar 2 disorder (HCC)    HIV (human immunodeficiency virus infection) (HCC)    Pancreatitis    PTSD (post-traumatic stress disorder)    Schizophrenia (HCC)    Seizures (HCC)    Subdural hematoma (HCC)     Review of Systems  Constitutional: No fever/chills Cardiovascular: Denies chest pain. Respiratory: Denies shortness of breath. Gastrointestinal: Positive epigastric abdominal pain. Positive  vomiting.  No diarrhea.  No constipation. Musculoskeletal: Negative for back pain. Skin: Negative for rash. Neurological: Negative for headaches  ____________________________________________   PHYSICAL EXAM:  VITAL SIGNS: ED Triage Vitals [10/01/23 0414]  Encounter Vitals Group     BP (!) 122/91     Pulse Rate 96     Resp 16     Temp 97.7 F (36.5 C)     SpO2 98 %   Constitutional: Alert and oriented. Well appearing and in no acute distress. Eyes: Conjunctivae are normal.  Head: Atraumatic. Nose: No congestion/rhinnorhea. Mouth/Throat: Mucous membranes are slightly dry.  Neck: No stridor.   Cardiovascular: Normal rate, regular rhythm. Good peripheral circulation. Grossly normal heart sounds.   Respiratory: Normal respiratory effort.  No retractions. Lungs CTAB. Gastrointestinal: Soft with mild diffuse  tenderness. No distention.  Musculoskeletal: No gross deformities of extremities. Neurologic:  Normal speech and language.  Skin:  Skin is warm, dry and intact. No rash noted.   ____________________________________________   LABS (all labs ordered are listed, but only abnormal results are displayed)  Labs Reviewed  COMPREHENSIVE METABOLIC PANEL - Abnormal; Notable for the following components:      Result Value   Glucose, Bld 122 (*)    Calcium 8.8 (*)    All other components within normal limits  LIPASE, BLOOD - Abnormal; Notable for the following components:   Lipase 533 (*)    All other components within normal limits  CBC WITH DIFFERENTIAL/PLATELET - Abnormal; Notable for the following components:   RBC 3.93 (*)    HCT 38.8 (*)    Platelets 420 (*)    All other components within normal limits  ETHANOL - Abnormal; Notable for the following components:   Alcohol, Ethyl (B) 161 (*)    All other components within normal limits  LACTIC ACID, PLASMA  LACTIC ACID, PLASMA  COMPREHENSIVE METABOLIC PANEL  CBC   ____________________________________________   PROCEDURES  Procedure(s) performed:   Procedures  None  ____________________________________________   INITIAL IMPRESSION / ASSESSMENT AND PLAN / ED COURSE  Pertinent labs & imaging results that were available during my care of the patient were reviewed by me and considered in my medical decision making (see chart for details).   This patient is Presenting for Evaluation of abdominal pain, which does require a range of treatment options, and is a complaint that involves  a high risk of morbidity and mortality.  The Differential Diagnoses include pancreatitis, gastritis, sepsis, AKI, dehydration, etc.  Critical Interventions-    Medications  enoxaparin (LOVENOX) injection 40 mg (40 mg Subcutaneous Given 10/01/23 1115)  0.9 %  sodium chloride infusion (0 mLs Intravenous Stopped 10/02/23 0428)  acetaminophen  (TYLENOL) tablet 650 mg (has no administration in time range)    Or  acetaminophen (TYLENOL) suppository 650 mg (has no administration in time range)  traZODone (DESYREL) tablet 25 mg (25 mg Oral Given 10/01/23 2100)  oxyCODONE (Oxy IR/ROXICODONE) immediate release tablet 5 mg (5 mg Oral Given 10/01/23 1809)  ondansetron (ZOFRAN) tablet 4 mg ( Oral See Alternative 10/01/23 1214)    Or  ondansetron (ZOFRAN) injection 4 mg (4 mg Intravenous Given 10/01/23 1214)  albuterol (PROVENTIL) (2.5 MG/3ML) 0.083% nebulizer solution 2.5 mg (has no administration in time range)  LORazepam (ATIVAN) tablet 1-4 mg (1 mg Oral Given 10/01/23 2037)  thiamine (VITAMIN B1) tablet 100 mg (100 mg Oral Given 10/01/23 0911)    Or  thiamine (VITAMIN B1) injection 100 mg ( Intravenous See Alternative 10/01/23 0911)  folic acid (FOLVITE) tablet 1 mg (1 mg Oral Given 10/01/23 0911)  multivitamin with minerals tablet 1 tablet (1 tablet Oral Given 10/01/23 0911)  hydrOXYzine (ATARAX) tablet 25 mg (25 mg Oral Given 10/01/23 1714)  loperamide (IMODIUM) capsule 2-4 mg (has no administration in time range)  chlordiazePOXIDE (LIBRIUM) capsule 25 mg (25 mg Oral Given 10/01/23 2100)    Followed by  chlordiazePOXIDE (LIBRIUM) capsule 25 mg (has no administration in time range)    Followed by  chlordiazePOXIDE (LIBRIUM) capsule 25 mg (has no administration in time range)    Followed by  chlordiazePOXIDE (LIBRIUM) capsule 25 mg (has no administration in time range)  levETIRAcetam (KEPPRA) tablet 500 mg (500 mg Oral Given 10/01/23 2100)  morphine (PF) 2 MG/ML injection 1 mg (1 mg Intravenous Given 10/02/23 0434)  sodium chloride 0.9 % bolus 1,000 mL (0 mLs Intravenous Stopped 10/01/23 0651)  morphine (PF) 4 MG/ML injection 4 mg (4 mg Intravenous Given 10/01/23 0518)  ondansetron (ZOFRAN) injection 4 mg (4 mg Intravenous Given 10/01/23 0517)  pantoprazole (PROTONIX) injection 80 mg (80 mg Intravenous Given 10/01/23 0517)   oxyCODONE-acetaminophen (PERCOCET/ROXICET) 5-325 MG per tablet 1 tablet (1 tablet Oral Given 10/01/23 0651)  sodium chloride 0.9 % bolus 1,000 mL (0 mLs Intravenous Stopped 10/01/23 0910)  sodium chloride 0.9 % bolus 1,000 mL (0 mLs Intravenous Stopped 10/01/23 0947)    Reassessment after intervention:  pain improved.   I decided to review pertinent External Data, and in summary patient seen earlier in the ED and discharge. D/c from inpatient stay the day prior. Returned to drinking EtOH. Patient is also un-housed.   Clinical Laboratory Tests Ordered, included lipase up-trending in the last 24 hours. CBC without leukocytosis. No AKI.   Radiologic Tests: patient with frequent CT abdomen imaging including scan from 6 days prior. No fever. Reassuring exam. Defer abdominal imaging for now.   Cardiac Monitor Tracing which shows NSR.    Social Determinants of Health Risk positive EtOH abuse and homelessness.   Consult complete with [facility]. Plan for admit.   Medical Decision Making: Summary:  Lynnell Chad returns emergency department with epigastric abdominal pain and vomiting.  Arrives with normal vital signs.  Doubt complicated pancreatitis.  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  Plan for repeat labs in comparison to labs done earlier.  Overall, symptoms appear fairly  chronic for the patient. Defer advanced imaging for now. Last Ct abdomen/pelvis was 6 days prior showing uncomplicated pancreatitis at that time. No pseudocyst or abscess.   Reevaluation with update and discussion with patient. Pain continues. Plan for admit for EtOH pancreatitis.   Patient's presentation is most consistent with acute presentation with potential threat to life or bodily function.   Disposition: admit  ____________________________________________  FINAL CLINICAL IMPRESSION(S) / ED DIAGNOSES  Final diagnoses:  Alcohol-induced acute pancreatitis, unspecified complication status     Note:  This document was prepared using Dragon voice recognition software and may include unintentional dictation errors.  Alona Bene, MD, [facility] Emergency Medicine    Taray Normoyle, Arlyss Repress, MD 10/02/23 [phone]

## 2023-10-01 NOTE — ED Notes (Signed)
Dr. Kirby Crigler notified about patients BP 82/60.

## 2023-10-01 NOTE — H&P (Signed)
History and Physical  Gary [name]:[id number] DOB: 02/18/1986 DOA: 10/01/2023  PCP: Patient, No Pcp Per   Chief Complaint: Abdominal pain, vomiting  HPI: Gary [name] is a 37 y.o. male with medical history significant for HIV not on treatment, polysubstance abuse, PTSD, alcohol abuse and alcohol induced pancreatitis being admitted to the hospital with recurrent alcohol induced pancreatitis.  Patient was recently hospitalized and discharged home on 11/20 after stay for uncomplicated pancreatitis but hospital course was prolonged by alcohol withdrawal requiring phenobarbital taper.  The day after discharge from the hospital, he presented to the emergency department with vomiting and abdominal pain, was found to have gastritis due to no evidence of worsening pancreatitis he was discharged from the hospital again.  Patient is reportedly homeless.  Early this morning he presented to the emergency department with recurrent worsening abdominal pain, vomiting.  Admits to drinking alcohol in the early morning hours today.  Diagnosed with recurrent acute pancreatitis and admitted to the hospitalist service.  Tells me his pain is in his epigastrium, radiating to the right side and into his back.  States that this is typical of his pancreatitis pain.  Review of Systems: Please see HPI for pertinent positives and negatives. A complete 10 system review of systems are otherwise negative.  Past Medical History:  Diagnosis Date   Alcohol abuse    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   Anxiety    Bipolar 2 disorder (HCC)    HIV (human immunodeficiency virus infection) (HCC)    Pancreatitis    PTSD (post-traumatic stress disorder)    Schizophrenia (HCC)    Seizures (HCC)    Subdural hematoma (HCC)    Past Surgical History:  Procedure Laterality Date   BIOPSY  04/19/2022   Procedure: BIOPSY;  Surgeon: Lemar Lofty., MD;  Location: [facility] ENDOSCOPY;  Service: Gastroenterology;;    ENTEROSCOPY N/A 04/19/2022   Procedure: ENTEROSCOPY;  Surgeon: Lemar Lofty., MD;  Location: [facility] ENDOSCOPY;  Service: Gastroenterology;  Laterality: N/A;   INCISION AND DRAINAGE PERIRECTAL ABSCESS N/A 09/24/2016   Procedure: IRRIGATION AND DEBRIDEMENT PERIRECTAL ABSCESS;  Surgeon: Ricarda Frame, MD;  Location: [facility] ORS;  Service: General;  Laterality: N/A;   none      Social History:  reports that he has been smoking cigarettes. He has never used smokeless tobacco. He reports current alcohol use of about 105.0 standard drinks of alcohol per week. He reports current drug use. Drug: Methamphetamines.   Allergies  Allergen Reactions   Tegretol [Carbamazepine] Other (See Comments)    Caused vertigo for 2 days after taking it   Caffeine Palpitations    Family History  Problem Relation Age of Onset   Alcohol abuse Mother    Alcohol abuse Father    Colon cancer Other    Other Other    Cancer Other      Prior to Admission medications   Medication Sig Start Date End Date Taking? Authorizing Provider  bictegravir-emtricitabine-tenofovir AF (BIKTARVY) 50-200-25 MG TABS tablet Take 1 tablet by mouth daily. Patient not taking: Reported on 09/13/2023    [provider]  folic acid (FOLVITE) 1 MG tablet Take 1 tablet (1 mg total) by mouth daily. Patient not taking: Reported on 09/13/2023 09/02/23 10/02/23  Lanae Boast, MD  HYDROcodone-acetaminophen (NORCO/VICODIN) 5-325 MG tablet Take 1 tablet by mouth every 4 (four) hours as needed for up to 3 days for severe pain (pain score 7-10). 09/30/23 10/03/23  Narda Bonds, MD  levETIRAcetam (KEPPRA) 500 MG tablet  Take 1 tablet (500 mg total) by mouth 2 (two) times daily. Patient not taking: Reported on 09/13/2023 08/03/23   Dione Booze, MD  pantoprazole (PROTONIX) 40 MG tablet Take 1 tablet (40 mg total) by mouth daily. 09/30/23   Lorre Nick, MD  polyethylene glycol powder [facility]) 17 GM/SCOOP powder Take 17 g by mouth daily.  09/30/23   Narda Bonds, MD  thiamine (VITAMIN B-1) 100 MG tablet Take 1 tablet (100 mg total) by mouth daily. Patient not taking: Reported on 09/13/2023 09/02/23 10/02/23  Lanae Boast, MD  famotidine (PEPCID) 20 MG tablet Take 1 tablet (20 mg total) by mouth 2 (two) times daily. Patient not taking: Reported on 06/01/2019 05/14/19 06/02/19  Benjiman Core, MD    Physical Exam: BP 111/62   Pulse 83   Temp 97.7 F (36.5 C)   Resp 16   SpO2 98%   General:  Alert, oriented, calm, in no acute distress, lying on his side on a stretcher in the ER.  Does not look toxic.  Pleasant and cooperative. Cardiovascular: RRR, no murmurs or rubs, no peripheral edema  Respiratory: clear to auscultation bilaterally, no wheezes, no crackles  Abdomen: soft, tender, nondistended, normal bowel tones heard  Skin: dry, no rashes  Musculoskeletal: no joint effusions, normal range of motion  Psychiatric: appropriate affect, normal speech  Neurologic: extraocular muscles intact, clear speech, moving all extremities with intact sensorium         Labs on Admission:  Basic Metabolic Panel: Recent Labs  Lab 09/25/23 0003 09/26/23 0311 09/26/23 0800 09/27/23 0252 09/28/23 0252 09/30/23 2107 10/01/23 0524  NA 138 136  --  136 135 135 135  K 2.7* 3.5  --  4.0 4.4 3.3* 3.5  CL 104 106  --  102 99 101 101  CO2 23 25  --  27 27 21* 23  GLUCOSE 108* 123*  --  98 97 97 122*  BUN 8 <5*  --  <5* <5* 9 8  CREATININE 0.52* 0.50*  --  0.56* 0.61 0.63 0.65  CALCIUM 8.7* 8.3*  --  8.9 9.5 9.5 8.8*  MG 2.4  --  1.5* 2.4 2.1  --   --   PHOS 4.3  --  3.6 4.5 5.0*  --   --    Liver Function Tests: Recent Labs  Lab 09/26/23 0311 09/27/23 0252 09/29/23 0302 09/30/23 2107 10/01/23 0524  AST 35 25 16 16 20   ALT 41 36 25 20 21   ALKPHOS 74 82 85 76 80  BILITOT 0.7 0.5 0.5 0.5 0.4  PROT 5.7* 6.6 6.8 7.6 7.3  ALBUMIN 2.8* 3.3* 3.2* 3.6 3.7   Recent Labs  Lab 09/26/23 0311 09/27/23 0252 09/29/23 0302  09/30/23 2107 10/01/23 0524  LIPASE 201* 93* 189* 157* 533*   No results for input(s): "AMMONIA" in the last 168 hours. CBC: Recent Labs  Lab 09/26/23 0311 09/27/23 0252 09/28/23 0252 09/30/23 2107 10/01/23 0524  WBC 2.6* 2.8* 4.4 5.9 4.6  NEUTROABS  --   --   --  3.4 2.8  HGB 11.0* 12.1* 13.6 13.5 13.1  HCT 32.7* 38.5* 41.6 39.3 38.8*  MCV 101.2* 103.8* 101.2* 98.5 98.7  PLT 315 352 479* 470* 420*   Cardiac Enzymes: No results for input(s): "CKTOTAL", "CKMB", "CKMBINDEX", "TROPONINI" in the last 168 hours.  BNP (last 3 results) No results for input(s): "BNP" in the last 8760 hours.  ProBNP (last 3 results) No results for input(s): "PROBNP" in the last 8760 hours.  CBG: Recent Labs  Lab 09/29/23 2350 09/30/23 0349 09/30/23 0754 09/30/23 1133 09/30/23 1526  GLUCAP 97 84 72 109* 114*    Radiological Exams on Admission: DG ABD ACUTE 2+V W 1V CHEST  Result Date: 09/30/2023 CLINICAL DATA:  Abdominal pain. EXAM: DG ABDOMEN ACUTE WITH 1 VIEW CHEST COMPARISON:  Radiographs earlier today, CT 09/25/2023 FINDINGS: Unchanged elevation of left hemidiaphragm. Left lung base atelectasis/scarring. No acute airspace disease. Normal heart size. No evidence of bowel obstruction or free air. Stable air throughout small bowel centrally, unchanged from earlier today. Small to moderate volume of stool in the colon. No acute osseous abnormalities are seen. IMPRESSION: 1. No bowel obstruction or free air. Unchanged bowel-gas pattern from earlier today. 2. Unchanged elevation of left hemidiaphragm with left lung base atelectasis/scarring. Electronically Signed   By: Narda Rutherford M.D.   On: 09/30/2023 22:37   DG Abd Portable 1V  Result Date: 09/30/2023 CLINICAL DATA:  Abdominal pain EXAM: PORTABLE ABDOMEN - 1 VIEW COMPARISON:  CT 09/25/23. FINDINGS: Gas seen in nondilated loops of small and large bowel. Scattered colonic stool. Air in the rectum. No obstruction. There is some motion on  this portable supine radiograph. The diaphragm is clipped off the edge of the film. Overlapping cardiac leads. IMPRESSION: Nonspecific bowel gas pattern. Please correlate with the prior CT findings of pancreatitis Electronically Signed   By: Karen Kays M.D.   On: 09/30/2023 14:13    Assessment/Plan LAIRD WEINHEIMER is a 37 y.o. male with medical history significant for HIV not on treatment, polysubstance abuse, PTSD, alcohol abuse and alcohol induced pancreatitis being admitted to the hospital with recurrent alcohol induced pancreatitis.   Acute alcoholic pancreatitis-with abdominal pain, nausea, elevated lipase and recent alcohol use.  He just had CT abdomen pelvis 09/25/2023, no significant fever or evidence of other complication so we will hold off on repeat imaging for now. -Observation admission to progressive -N.p.o. except for ice chips and meds -Pain and nausea control as needed -Aggressive IV fluids  Alcohol abuse-with history of multiple hospital admissions complicated by alcohol withdrawal -Librium taper -P.o. Ativan per CIWA protocol -Thiamine, folate, multivitamin  HIV disease-noncompliant with treatment  Seizure history-noncompliant with Keppra, will resume this  DVT prophylaxis: Lovenox     Code Status: Full Code  Consults called: None  Admission status: Observation  Time spent: 55 minutes  Mahnoor Mathisen Sharlette Dense MD [facility] Pager [phone]  If 7PM-7AM, please contact night-coverage [email or web address] Password [facility]  10/01/2023, 7:51 AM

## 2023-10-02 DIAGNOSIS — R109 Unspecified abdominal pain: Secondary | ICD-10-CM | POA: Diagnosis present

## 2023-10-02 DIAGNOSIS — Z79899 Other long term (current) drug therapy: Secondary | ICD-10-CM | POA: Diagnosis not present

## 2023-10-02 DIAGNOSIS — Z91048 Other nonmedicinal substance allergy status: Secondary | ICD-10-CM | POA: Diagnosis not present

## 2023-10-02 DIAGNOSIS — R569 Unspecified convulsions: Secondary | ICD-10-CM | POA: Diagnosis present

## 2023-10-02 DIAGNOSIS — K852 Alcohol induced acute pancreatitis without necrosis or infection: Secondary | ICD-10-CM

## 2023-10-02 DIAGNOSIS — F1020 Alcohol dependence, uncomplicated: Secondary | ICD-10-CM | POA: Diagnosis present

## 2023-10-02 DIAGNOSIS — Z888 Allergy status to other drugs, medicaments and biological substances status: Secondary | ICD-10-CM | POA: Diagnosis not present

## 2023-10-02 DIAGNOSIS — F17210 Nicotine dependence, cigarettes, uncomplicated: Secondary | ICD-10-CM | POA: Diagnosis present

## 2023-10-02 DIAGNOSIS — T375X6A Underdosing of antiviral drugs, initial encounter: Secondary | ICD-10-CM | POA: Diagnosis present

## 2023-10-02 DIAGNOSIS — Z5901 Sheltered homelessness: Secondary | ICD-10-CM | POA: Diagnosis not present

## 2023-10-02 DIAGNOSIS — T426X6A Underdosing of other antiepileptic and sedative-hypnotic drugs, initial encounter: Secondary | ICD-10-CM | POA: Diagnosis present

## 2023-10-02 DIAGNOSIS — F4310 Post-traumatic stress disorder, unspecified: Secondary | ICD-10-CM | POA: Diagnosis present

## 2023-10-02 DIAGNOSIS — Y906 Blood alcohol level of 120-199 mg/100 ml: Secondary | ICD-10-CM | POA: Diagnosis present

## 2023-10-02 DIAGNOSIS — Z800 Family history of malignant neoplasm of digestive organs: Secondary | ICD-10-CM | POA: Diagnosis not present

## 2023-10-02 DIAGNOSIS — F3181 Bipolar II disorder: Secondary | ICD-10-CM | POA: Diagnosis present

## 2023-10-02 DIAGNOSIS — Z91138 Patient's unintentional underdosing of medication regimen for other reason: Secondary | ICD-10-CM | POA: Diagnosis not present

## 2023-10-02 DIAGNOSIS — E876 Hypokalemia: Secondary | ICD-10-CM | POA: Diagnosis present

## 2023-10-02 DIAGNOSIS — B20 Human immunodeficiency virus [HIV] disease: Secondary | ICD-10-CM | POA: Diagnosis present

## 2023-10-02 DIAGNOSIS — F209 Schizophrenia, unspecified: Secondary | ICD-10-CM | POA: Diagnosis present

## 2023-10-02 DIAGNOSIS — Z811 Family history of alcohol abuse and dependence: Secondary | ICD-10-CM | POA: Diagnosis not present

## 2023-10-02 LAB — COMPREHENSIVE METABOLIC PANEL
ALT: 15 U/L (ref 0–44)
AST: 15 U/L (ref 15–41)
Albumin: 2.6 g/dL — ABNORMAL LOW (ref 3.5–5.0)
Alkaline Phosphatase: 61 U/L (ref 38–126)
Anion gap: 6 (ref 5–15)
BUN: <5 mg/dL — ABNORMAL LOW (ref 6–20)
CO2: 23 mmol/L (ref 22–32)
Calcium: 8.1 mg/dL — ABNORMAL LOW (ref 8.9–10.3)
Chloride: 105 mmol/L (ref 98–111)
Creatinine, Ser: 0.58 mg/dL — ABNORMAL LOW (ref 0.61–1.24)
GFR, Estimated: >60 mL/min (ref >60)
Glucose, Bld: 75 mg/dL (ref 70–99)
Potassium: 3.5 mmol/L (ref 3.5–5.1)
Sodium: 134 mmol/L — ABNORMAL LOW (ref 135–145)
Total Bilirubin: 0.5 mg/dL (ref <1.2)
Total Protein: 5.5 g/dL — ABNORMAL LOW (ref 6.5–8.1)

## 2023-10-02 LAB — CBC
HCT: 35.2 % — ABNORMAL LOW (ref 39.0–52.0)
Hemoglobin: 11.3 g/dL — ABNORMAL LOW (ref 13.0–17.0)
MCH: 32.8 pg (ref 26.0–34.0)
MCHC: 32.1 g/dL (ref 30.0–36.0)
MCV: 102 fL — ABNORMAL HIGH (ref 80.0–100.0)
Platelets: 296 10*3/uL (ref 150–400)
RBC: 3.45 MIL/uL — ABNORMAL LOW (ref 4.22–5.81)
RDW: 14.3 % (ref 11.5–15.5)
WBC: 3 10*3/uL — ABNORMAL LOW (ref 4.0–10.5)
nRBC: 0 % (ref 0.0–0.2)

## 2023-10-02 MED ORDER — SODIUM CHLORIDE 0.9 % IV SOLN: INTRAVENOUS | Status: DC

## 2023-10-02 NOTE — Progress Notes (Signed)
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--not visualized on recent scan PTSD schizophrenia He has severe alcohol use disorder-he has been admitted several times placed on phenobarb tapers and on Keppra-he is noncompliant on his HIV meds last CD4 count 07/18/2023 was 248, HIV RNA 47,000  Patient was discharged on 09/30/2023 only to return the same day having drank 1/5 of wine and started having abdominal pain with emesis Alcohol level 184 acute abdominal series no free air did not feel like he needed other resources-was strongly encouraged not to use EtOH and was discharged to homeless shelter  Return 10/01/2023 early morning 0 400 with intractable pain and kept n.p.o. on ice chips and meds with aggressive IV fluid Librium taper etc. Lipase 533 BUN/creatinine 8/0.6 WBC 4.6 platelet 420   Plan  Re- current alcoholic pancreatitis Needs to absolutely abstain from drinking--continue IVF 50 cc/H, soft diet only Would not reimage unless signs of SIRS and/or sepsis-I have explained to him we will keep IV pain meds on board today with no escalation and that we would primarily use Oxy IR from tomorrow onwards  Ethanol abuse disorder On Librium taper with hydroxyzine So far not withdrawing Last admission required phenobarbital  Uncontrolled noncompliant HIV Has not been taking Biktarvy-to prevent resistance would not resume the same  Mild pancytopenia-monitor only at this time  Seizure related to alcohol use versus seizure disorder noncompliant with Keppra Resumed at admission  500 twice daily   DVT prophylaxis: SCD  Status is: Observation The patient remains OBS appropriate and will d/c before 2 midnights.      Subjective: Awake coherent no distress looks fair seems to have well-controlled pain  Objective + exam Vitals:   10/01/23 2037 10/01/23 2039 10/01/23 2329 10/02/23 0319  BP: 97/64 101/68 95/69 109/76  Pulse: 60 61 (!) 51 62  Resp:   18 18  Temp:   98.8 F (37.1 C) 98.8 F (37.1 C)  TempSrc:   Oral Oral  SpO2:   99% 99%  Weight:      Height:       Filed Weights   10/01/23 1217  Weight: 65 kg    Examination:  Frail cachectic white male Chest clear no wheeze No icterus Abdomen slightly tender in epigastrium No lower extremity edema Neuro intact  Data Reviewed: reviewed   CBC    Component Value Date/Time   WBC 3.0 (L) 10/02/2023 0549   RBC 3.45 (L) 10/02/2023 0549   HGB 11.3 (L) 10/02/2023 0549   HGB 11.1 (L) 06/15/2018 1902   HCT 35.2 (L) 10/02/2023 0549   HCT 33.0 (L) 06/15/2018 1902   PLT 296 10/02/2023 0549   PLT 235 06/15/2018 1902   MCV 102.0 (H) 10/02/2023 0549   MCV 94 06/15/2018 1902   MCV 100 11/15/2014 1810   MCH 32.8 10/02/2023 0549   MCHC 32.1 10/02/2023 0549   RDW 14.3 10/02/2023 0549   RDW 13.8 06/15/2018 1902   RDW 13.8 11/15/2014 1810   LYMPHSABS 1.4 10/01/2023 0524   LYMPHSABS 1.0 06/15/2018 1902   MONOABS 0.3 10/01/2023 0524  EOSABS 0.1 10/01/2023 0524   EOSABS 0.0 06/15/2018 1902   BASOSABS 0.1 10/01/2023 0524   BASOSABS 0.0 06/15/2018 1902      Latest Ref Rng & Units 10/01/2023    5:24 AM 09/30/2023    9:07 PM 09/29/2023    3:02 AM  CMP  Glucose 70 - 99 mg/dL 621  97    BUN 6 - 20 mg/dL 8  9    Creatinine 3.08 - 1.24 mg/dL 6.57  8.46    Sodium 962 - 145 mmol/L 135  135    Potassium 3.5 - 5.1 mmol/L 3.5  3.3    Chloride 98 - 111 mmol/L 101  101    CO2 22 - 32 mmol/L 23  21    Calcium 8.9 - 10.3 mg/dL 8.8  9.5    Total Protein 6.5 - 8.1 g/dL 7.3  7.6  6.8   Total Bilirubin <1.2  mg/dL 0.4  0.5  0.5   Alkaline Phos 38 - 126 U/L 80  76  85   AST 15 - 41 U/L 20  16  16    ALT 0 - 44 U/L 21  20  25       Scheduled Meds:  chlordiazePOXIDE  25 mg Oral TID   Followed by   Melene Muller ON 10/03/2023] chlordiazePOXIDE  25 mg Oral BH-qamhs   Followed by   Melene Muller ON 10/04/2023] chlordiazePOXIDE  25 mg Oral Daily   enoxaparin (LOVENOX) injection  40 mg Subcutaneous Q24H   folic acid  1 mg Oral Daily   levETIRAcetam  500 mg Oral BID   multivitamin with minerals  1 tablet Oral Daily   thiamine  100 mg Oral Daily   Or   thiamine  100 mg Intravenous Daily   Continuous Infusions:  Time  47  Rhetta Mura, MD  [facility]

## 2023-10-02 NOTE — Plan of Care (Signed)
  Problem: Education: Goal: Knowledge of General Education information will improve Description: Including pain rating scale, medication(s)/side effects and non-pharmacologic comfort measures Outcome: Progressing   Problem: Coping: Goal: Level of anxiety will decrease Outcome: Progressing   Problem: Elimination: Goal: Will not experience complications related to urinary retention Outcome: Progressing   Problem: Pain Management: Goal: General experience of comfort will improve Outcome: Not Progressing   Problem: Safety: Goal: Ability to remain free from injury will improve Outcome: Progressing   Problem: Skin Integrity: Goal: Risk for impaired skin integrity will decrease Outcome: Progressing

## 2023-10-03 DIAGNOSIS — K852 Alcohol induced acute pancreatitis without necrosis or infection: Secondary | ICD-10-CM | POA: Diagnosis not present

## 2023-10-03 LAB — COMPREHENSIVE METABOLIC PANEL
ALT: 13 U/L (ref 0–44)
AST: 11 U/L — ABNORMAL LOW (ref 15–41)
Albumin: 2.7 g/dL — ABNORMAL LOW (ref 3.5–5.0)
Alkaline Phosphatase: 60 U/L (ref 38–126)
Anion gap: 4 — ABNORMAL LOW (ref 5–15)
BUN: 7 mg/dL (ref 6–20)
CO2: 25 mmol/L (ref 22–32)
Calcium: 8.5 mg/dL — ABNORMAL LOW (ref 8.9–10.3)
Chloride: 103 mmol/L (ref 98–111)
Creatinine, Ser: 0.6 mg/dL — ABNORMAL LOW (ref 0.61–1.24)
GFR, Estimated: >60 mL/min (ref >60)
Glucose, Bld: 98 mg/dL (ref 70–99)
Potassium: 3.3 mmol/L — ABNORMAL LOW (ref 3.5–5.1)
Sodium: 132 mmol/L — ABNORMAL LOW (ref 135–145)
Total Bilirubin: 0.4 mg/dL (ref <1.2)
Total Protein: 5.6 g/dL — ABNORMAL LOW (ref 6.5–8.1)

## 2023-10-03 LAB — CBC
HCT: 34.2 % — ABNORMAL LOW (ref 39.0–52.0)
Hemoglobin: 11.2 g/dL — ABNORMAL LOW (ref 13.0–17.0)
MCH: 32.9 pg (ref 26.0–34.0)
MCHC: 32.7 g/dL (ref 30.0–36.0)
MCV: 100.6 fL — ABNORMAL HIGH (ref 80.0–100.0)
Platelets: 283 10*3/uL (ref 150–400)
RBC: 3.4 MIL/uL — ABNORMAL LOW (ref 4.22–5.81)
RDW: 13.9 % (ref 11.5–15.5)
WBC: 4.1 10*3/uL (ref 4.0–10.5)
nRBC: 0 % (ref 0.0–0.2)

## 2023-10-03 MED ORDER — LEVETIRACETAM 500 MG PO TABS: 500.0000 mg | ORAL_TABLET | Freq: Two times a day (BID) | ORAL | 11 refills | Status: DC

## 2023-10-03 MED ORDER — PANTOPRAZOLE SODIUM 40 MG PO TBEC: 40.0000 mg | DELAYED_RELEASE_TABLET | Freq: Every day | ORAL | 0 refills | Status: DC

## 2023-10-03 MED ORDER — POTASSIUM CHLORIDE CRYS ER 20 MEQ PO TBCR
40.0000 meq | EXTENDED_RELEASE_TABLET | Freq: Every day | ORAL | Status: DC
  Administered 2023-10-03: 40 meq via ORAL
  Filled 2023-10-03: qty 2

## 2023-10-03 NOTE — Discharge Summary (Signed)
Physician Discharge Summary  Gary [name] DGL:[id number] DOB: 1986/07/10 DOA: 10/01/2023  PCP: Patient, No Pcp Per  Admit date: 10/01/2023 Discharge date: 10/03/2023  Time spent: 26 minutes  Recommendations for Outpatient Follow-up:  Recommend outpatient follow-up with [facility] for management of HIV High risk for readmission  Discharge Diagnoses:  MAIN problem for hospitalization   Acute pancreatitis secondary to alcoholism Ethanol abuse disorder HIV noncompliant on meds Homelessness  Please see below for itemized issues addressed in HOpsital- refer to other progress notes for clarity if needed  Discharge Condition: Guarded  Diet recommendation: Regular  Filed Weights   10/01/23 1217  Weight: 65 kg    History of present illness:  37 year old white male -- intermittently homeless-stays with friends 7 admissions since February 2024 known history of polysubstance abuse Underlying EtOH half gallon vodka daily prior EtOH withdrawal seizures 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--not visualized on recent scan PTSD schizophrenia He has severe alcohol use disorder-he has been admitted several times placed on phenobarb tapers and on Keppra-he is noncompliant on his HIV meds last CD4 count 07/18/2023 was 248, HIV RNA 47,000   Patient was discharged on 09/30/2023 only to return the same day having drank 1/5 of wine and started having abdominal pain with emesis Alcohol level 184 acute abdominal series no free air did not feel like he needed other resources-was strongly encouraged not to use EtOH and was discharged to homeless shelter   Return 10/01/2023 early morning 0 400 with intractable pain and kept n.p.o. on ice chips and meds with aggressive IV fluid Librium taper etc. Lipase 533 BUN/creatinine 8/0.6 WBC 4.6 platelet 420     Plan   Re- current alcoholic pancreatitis Needs to absolutely abstain from drinking--IVF saline locked tolerating diet at discharge  uncomfortable Understands quite clearly that no prescription meds will be given at discharge for pain control   Ethanol abuse disorder On Librium taper with hydroxyzine  So far not withdrawing-withdrawal scales seemed to be below 5 consecutively this admission he did not require phenobarbital He has reliably withdrawn off of alcohol and will need to stop completely Uncontrolled noncompliant HIV Has not been taking Biktarvy-to prevent resistance would not resume the same   Mild pancytopenia, macrocytic anemia-monitor only at this time   Seizure related to alcohol use versus seizure disorder noncompliant with Keppra Resumed at admission 500 twice daily  Mild hypokalemia Outpatient labs as able-no need for replacement aggressively  Hospital Course:   Discharge Exam: Vitals:   10/03/23 0248 10/03/23 0625  BP:  104/63  Pulse:  (!) 51  Resp: 14 16  Temp:  98.2 F (36.8 C)  SpO2:      Subj on day of d/c   Awake coherent looks well feels fair eating  General Exam on discharge  EOMI NCAT no focal deficit Neck soft supple Thin eccentric quite wasted appearing No thrush No submandibular lymphadenopathy Chest clear no added sound Abdomen soft no rebound Bruising over lower extremities Power 5/5  Discharge Instructions   Discharge Instructions     Diet - low sodium heart healthy   Complete by: As directed    Discharge instructions   Complete by: As directed    Cease/desist from drinking immediately and stay off of alcohol if you do not want to have further issues with your pancreas problems Please follow-up with [facility] for further management of your other underlying conditions   Increase activity slowly   Complete by: As directed       Allergies as of  10/03/2023       Reactions   Tegretol [carbamazepine] Other (See Comments)   Caused vertigo for 2 days after taking it   Caffeine Palpitations        Medication List     STOP taking these medications     Biktarvy 50-200-25 MG Tabs tablet Generic drug: bictegravir-emtricitabine-tenofovir AF   folic acid 1 MG tablet Commonly known as: FOLVITE   HYDROcodone-acetaminophen 5-325 MG tablet Commonly known as: NORCO/VICODIN   polyethylene glycol powder 17 GM/SCOOP powder Commonly known as: MiraLax   thiamine 100 MG tablet Commonly known as: VITAMIN B1       TAKE these medications    levETIRAcetam 500 MG tablet Commonly known as: KEPPRA Take 1 tablet (500 mg total) by mouth 2 (two) times daily.   pantoprazole 40 MG tablet Commonly known as: Protonix Take 1 tablet (40 mg total) by mouth daily.       Allergies  Allergen Reactions   Tegretol [Carbamazepine] Other (See Comments)    Caused vertigo for 2 days after taking it   Caffeine Palpitations      The results of significant diagnostics from this hospitalization (including imaging, microbiology, ancillary and laboratory) are listed below for reference.    Significant Diagnostic Studies: DG ABD ACUTE 2+V W 1V CHEST  Result Date: 09/30/2023 CLINICAL DATA:  Abdominal pain. EXAM: DG ABDOMEN ACUTE WITH 1 VIEW CHEST COMPARISON:  Radiographs earlier today, CT 09/25/2023 FINDINGS: Unchanged elevation of left hemidiaphragm. Left lung base atelectasis/scarring. No acute airspace disease. Normal heart size. No evidence of bowel obstruction or free air. Stable air throughout small bowel centrally, unchanged from earlier today. Small to moderate volume of stool in the colon. No acute osseous abnormalities are seen. IMPRESSION: 1. No bowel obstruction or free air. Unchanged bowel-gas pattern from earlier today. 2. Unchanged elevation of left hemidiaphragm with left lung base atelectasis/scarring. Electronically Signed   By: Narda Rutherford M.D.   On: 09/30/2023 22:37   DG Abd Portable 1V  Result Date: 09/30/2023 CLINICAL DATA:  Abdominal pain EXAM: PORTABLE ABDOMEN - 1 VIEW COMPARISON:  CT 09/25/23. FINDINGS: Gas seen in nondilated  loops of small and large bowel. Scattered colonic stool. Air in the rectum. No obstruction. There is some motion on this portable supine radiograph. The diaphragm is clipped off the edge of the film. Overlapping cardiac leads. IMPRESSION: Nonspecific bowel gas pattern. Please correlate with the prior CT findings of pancreatitis Electronically Signed   By: Karen Kays M.D.   On: 09/30/2023 14:13   CT ABDOMEN PELVIS W CONTRAST  Result Date: 09/25/2023 CLINICAL DATA:  Epigastric pain EXAM: CT ABDOMEN AND PELVIS WITH CONTRAST TECHNIQUE: Multidetector CT imaging of the abdomen and pelvis was performed using the standard protocol following bolus administration of intravenous contrast. RADIATION DOSE REDUCTION: This exam was performed according to the departmental dose-optimization program which includes automated exposure control, adjustment of the mA and/or kV according to patient size and/or use of iterative reconstruction technique. CONTRAST:  OMNIPAQUE IOHEXOL 300 MG/ML  SOLN COMPARISON:  09/16/2023 FINDINGS: Lower chest: Elevation of the left hemidiaphragm, stable. No acute findings. Hepatobiliary: Diffuse low-density throughout the liver compatible with fatty infiltration. No focal abnormality. Gallbladder unremarkable. Common bile duct prominent at 8 mm, similar to prior study. Pancreas: Truncation of the pancreatic tail. Enlargement of the pancreatic head with surrounding peripancreatic edema. Findings are similar to prior study. Spleen: No focal abnormality.  Normal size. Adrenals/Urinary Tract: No adrenal abnormality. No focal renal abnormality. No stones or  hydronephrosis. Urinary bladder is unremarkable. Stomach/Bowel: Normal appendix. Stomach, large and small bowel grossly unremarkable. Vascular/Lymphatic: No evidence of aneurysm or adenopathy. Reproductive: No visible focal abnormality. Other: No free fluid or free air. Musculoskeletal: No acute bony abnormality IMPRESSION: Continued enlargement  of the pancreatic head with surrounding inflammation compatible with acute pancreatitis Hepatic steatosis. Electronically Signed   By: Charlett Nose M.D.   On: 09/25/2023 02:34    Microbiology: No results found for this or any previous visit (from the past 240 hour(s)).   Labs: Basic Metabolic Panel: Recent Labs  Lab 09/27/23 0252 09/28/23 0252 09/30/23 2107 10/01/23 0524 10/02/23 0549 10/03/23 0522  NA 136 135 135 135 134* 132*  K 4.0 4.4 3.3* 3.5 3.5 3.3*  CL 102 99 101 101 105 103  CO2 27 27 21* 23 23 25   GLUCOSE 98 97 97 122* 75 98  BUN <5* <5* 9 8 <5* 7  CREATININE 0.56* 0.61 0.63 0.65 0.58* 0.60*  CALCIUM 8.9 9.5 9.5 8.8* 8.1* 8.5*  MG 2.4 2.1  --   --   --   --   PHOS 4.5 5.0*  --   --   --   --    Liver Function Tests: Recent Labs  Lab 09/29/23 0302 09/30/23 2107 10/01/23 0524 10/02/23 0549 10/03/23 0522  AST 16 16 20 15  11*  ALT 25 20 21 15 13   ALKPHOS 85 76 80 61 60  BILITOT 0.5 0.5 0.4 0.5 0.4  PROT 6.8 7.6 7.3 5.5* 5.6*  ALBUMIN 3.2* 3.6 3.7 2.6* 2.7*   Recent Labs  Lab 09/27/23 0252 09/29/23 0302 09/30/23 2107 10/01/23 0524  LIPASE 93* 189* 157* 533*   No results for input(s): "AMMONIA" in the last 168 hours. CBC: Recent Labs  Lab 09/28/23 0252 09/30/23 2107 10/01/23 0524 10/02/23 0549 10/03/23 0522  WBC 4.4 5.9 4.6 3.0* 4.1  NEUTROABS  --  3.4 2.8  --   --   HGB 13.6 13.5 13.1 11.3* 11.2*  HCT 41.6 39.3 38.8* 35.2* 34.2*  MCV 101.2* 98.5 98.7 102.0* 100.6*  PLT 479* 470* 420* 296 283   Cardiac Enzymes: No results for input(s): "CKTOTAL", "CKMB", "CKMBINDEX", "TROPONINI" in the last 168 hours. BNP: BNP (last 3 results) No results for input(s): "BNP" in the last 8760 hours.  ProBNP (last 3 results) No results for input(s): "PROBNP" in the last 8760 hours.  CBG: Recent Labs  Lab 09/29/23 2350 09/30/23 0349 09/30/23 0754 09/30/23 1133 09/30/23 1526  GLUCAP 97 84 72 109* 114*       Signed:  Rhetta Mura MD    [facility] 10/03/2023, 11:03 AM

## 2023-10-03 NOTE — TOC Initial Note (Signed)
Transition of Care [facility]) - Initial/Assessment Note    Patient Details  Name: Gary [name] MRN: [id number] Date of Birth: [birth year]  Transition of Care [facility]) CM/SW Contact:    Gary Prows, RN Phone Number: 10/03/2023, 11:26 AM  Clinical Narrative:                 TOC for SA counseling/education; spoke w/ pt in room; pt says he is homeless, and he "will be heading toward downtown"; pt says he does not have a PCP; he also says he does not have transportation, and requested a bus pass; pt verified his POC Gary [name] (mother) [phone]; pt declines this RN CM assisting him to obtain appt for PCP; pt also declines resources for [facility], OP/IP substance abuse, financial assistance, transportation, shelters, and social services; pt's bus pass given to [place], Charity fundraiser; no TOC needs.   Expected Discharge Plan: Home/Self Care Barriers to Discharge: No Barriers Identified   Patient Goals and CMS Choice Patient states their goals for this hospitalization and ongoing recovery are:: "going [place] downtown" CMS Medicare.gov Compare Post Acute Care list provided to:: Patient Choice offered to / list presented to : NA      Expected Discharge Plan and Services   Discharge Planning Services: CM Consult   Living arrangements for the past 2 months: Homeless Expected Discharge Date: 10/03/23               DME Arranged: N/A DME Agency: NA       HH Arranged: NA HH Agency: NA        Prior Living Arrangements/Services Living arrangements for the past 2 months: Homeless Lives with:: Self Patient language and need for interpreter reviewed:: Yes Do you feel safe going back to the place where you live?: Yes      Need for Family Participation in Patient Care: Yes (Comment) Care giver support system in place?: Yes (comment) Current home services:  (n/a) Criminal Activity/Legal Involvement Pertinent to Current Situation/Hospitalization: No - Comment as  needed  Activities of Daily Living   ADL Screening (condition at time of admission) Independently performs ADLs?: Yes (appropriate for developmental age) Is the patient deaf or have difficulty hearing?: No Does the patient have difficulty seeing, even when wearing glasses/contacts?: No Does the patient have difficulty concentrating, remembering, or making decisions?: No  Permission Sought/Granted Permission sought to share information with : Case Manager Permission granted to share information with : Yes, Verbal Permission Granted  Share Information with NAME: Case Manager     Permission granted to share info w Relationship: Gary [name] (mother) [phone]     Emotional Assessment Appearance:: Appears stated age Attitude/Demeanor/Rapport: Gracious Affect (typically observed): Accepting Orientation: : Oriented to Self, Oriented to Place, Oriented to  Time, Oriented to Situation Alcohol / Substance Use: Alcohol Use Psych Involvement: No (comment)  Admission diagnosis:  Acute alcoholic pancreatitis [K85.20] Alcohol-induced acute pancreatitis, unspecified complication status [K85.20] Patient Active Problem List   Diagnosis Date Noted   Macrocytic anemia 09/26/2023   Alcoholic fatty liver 09/25/2023   Protein-calorie malnutrition, severe 12/25/2022   Hypomagnesemia 12/24/2022   Alcohol withdrawal syndrome without complication (HCC) 12/23/2022   Seizure disorder (HCC) 12/17/2022   Pancytopenia (HCC) 12/14/2022   Alcohol-induced pancreatitis 12/14/2022   Delirium tremens (HCC) 10/19/2022   Alcohol abuse with alcohol-induced mood disorder (HCC) 10/16/2022   Alcohol dependence with withdrawal (HCC) 10/16/2022   Abnormal LFTs 10/16/2022   Acute alcoholic pancreatitis 09/11/2022   AKI (acute kidney injury) (HCC)  05/28/2022   Homelessness 05/28/2022   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   Metabolic acidosis with increased anion gap and accumulation of organic acids 04/16/2022    Hypokalemia 01/22/2022   Alcohol-induced insomnia (HCC)    MDD (major depressive disorder), recurrent episode, 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/26/2021   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   Alcohol abuse    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   Malingering 05/15/2020   Malnutrition of moderate degree 05/07/2020   Alcoholic ketoacidosis 05/05/2020   Thrombocytopenia (HCC) 07/25/2019   Traumatic subdural hematoma (HCC) 07/02/2019   Alcoholic intoxication with complication (HCC) 07/02/2019   Hypocalcemia 07/02/2019   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   Alcohol use disorder, severe, dependence (HCC) 04/16/2019   Major depressive disorder, 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 [place] [address] Phone: [phone] Fax: [phone]  [facility]) [phone] - [place], [place] - [address] AT [facility] [address] [place] [address] Phone: [phone] Fax: [phone]  [id number] [place], [place] - [address] AT [facility] OF [address] & [address] [place] [address] Phone: [phone] Fax: [phone]     Social Determinants of Health (SDOH) Social History: SDOH Screenings   Food Insecurity: Food Insecurity Present (10/03/2023)  Housing: High Risk (10/03/2023)  Transportation Needs: Unmet Transportation Needs (10/03/2023)  Utilities: Not At Risk (10/03/2023)  Alcohol Screen: High Risk (07/25/2021)  Depression (PHQ2-9): Medium Risk (02/12/2021)  Financial Resource Strain: High Risk (06/30/2022)   Received from [facility]  Social Connections: Unknown (06/24/2022)   Received from  [facility]  Stress: No Stress Concern Present (06/30/2022)   [facility]  Recent Concern: Stress - Stress Concern Present (06/25/2022)   Received from [facility]  Tobacco Use: High Risk (10/01/2023)   SDOH Interventions: Food Insecurity Interventions: Inpatient TOC (patient declined resources) Housing Interventions: Inpatient TOC (patient declined resources) Transportation Interventions: Inpatient TOC, Bus Pass Given Utilities Interventions: Intervention Not Indicated, Inpatient TOC   Readmission Risk Interventions    10/03/2023   11:23 AM 09/30/2023    3:35 PM 09/26/2023    2:46 PM  Readmission Risk Prevention Plan  Transportation Screening Complete Complete Complete  Medication Review Oceanographer) Complete Complete Complete  PCP or Specialist appointment within 3-5 days of discharge Patient refused Patient refused   HRI or Home Care Consult Complete Complete Complete  SW Recovery Care/Counseling Consult Patient refused Complete Complete  Palliative Care Screening Not Applicable Not Applicable Not Applicable  Skilled Nursing Facility Not Applicable Not Applicable Not Applicable

## 2023-10-04 ENCOUNTER — Other Ambulatory Visit (HOSPITAL_COMMUNITY): Payer: Self-pay

## 2023-10-09 ENCOUNTER — Encounter (HOSPITAL_COMMUNITY): Payer: Self-pay | Admitting: Family Medicine

## 2023-10-09 ENCOUNTER — Inpatient Hospital Stay (HOSPITAL_COMMUNITY)
Admission: EM | Admit: 2023-10-09 | Discharge: 2023-10-15 | DRG: 438 | Disposition: A | Discharge status: Home / Self Care | Payer: MEDICAID | Attending: Family Medicine | Admitting: Family Medicine

## 2023-10-09 ENCOUNTER — Other Ambulatory Visit: Payer: Self-pay

## 2023-10-09 DIAGNOSIS — K859 Acute pancreatitis without necrosis or infection, unspecified: Secondary | ICD-10-CM | POA: Diagnosis present

## 2023-10-09 DIAGNOSIS — R627 Adult failure to thrive: Secondary | ICD-10-CM | POA: Diagnosis present

## 2023-10-09 DIAGNOSIS — K863 Pseudocyst of pancreas: Secondary | ICD-10-CM | POA: Diagnosis present

## 2023-10-09 DIAGNOSIS — Z9109 Other allergy status, other than to drugs and biological substances: Secondary | ICD-10-CM

## 2023-10-09 DIAGNOSIS — Z91199 Patient's noncompliance with other medical treatment and regimen due to unspecified reason: Secondary | ICD-10-CM

## 2023-10-09 DIAGNOSIS — K852 Alcohol induced acute pancreatitis without necrosis or infection: Secondary | ICD-10-CM | POA: Diagnosis present

## 2023-10-09 DIAGNOSIS — F10232 Alcohol dependence with withdrawal with perceptual disturbance: Secondary | ICD-10-CM | POA: Diagnosis not present

## 2023-10-09 DIAGNOSIS — Z888 Allergy status to other drugs, medicaments and biological substances status: Secondary | ICD-10-CM

## 2023-10-09 DIAGNOSIS — G40509 Epileptic seizures related to external causes, not intractable, without status epilepticus: Secondary | ICD-10-CM | POA: Diagnosis present

## 2023-10-09 DIAGNOSIS — F17210 Nicotine dependence, cigarettes, uncomplicated: Secondary | ICD-10-CM | POA: Diagnosis present

## 2023-10-09 DIAGNOSIS — G40909 Epilepsy, unspecified, not intractable, without status epilepticus: Secondary | ICD-10-CM | POA: Diagnosis not present

## 2023-10-09 DIAGNOSIS — K860 Alcohol-induced chronic pancreatitis: Secondary | ICD-10-CM | POA: Diagnosis present

## 2023-10-09 DIAGNOSIS — Z79899 Other long term (current) drug therapy: Secondary | ICD-10-CM

## 2023-10-09 DIAGNOSIS — F3181 Bipolar II disorder: Secondary | ICD-10-CM | POA: Diagnosis present

## 2023-10-09 DIAGNOSIS — E871 Hypo-osmolality and hyponatremia: Secondary | ICD-10-CM | POA: Diagnosis not present

## 2023-10-09 DIAGNOSIS — F10230 Alcohol dependence with withdrawal, uncomplicated: Secondary | ICD-10-CM | POA: Diagnosis present

## 2023-10-09 DIAGNOSIS — D649 Anemia, unspecified: Secondary | ICD-10-CM | POA: Diagnosis present

## 2023-10-09 DIAGNOSIS — K760 Fatty (change of) liver, not elsewhere classified: Secondary | ICD-10-CM | POA: Diagnosis present

## 2023-10-09 DIAGNOSIS — Z765 Malingerer [conscious simulation]: Secondary | ICD-10-CM | POA: Diagnosis not present

## 2023-10-09 DIAGNOSIS — B20 Human immunodeficiency virus [HIV] disease: Secondary | ICD-10-CM | POA: Diagnosis present

## 2023-10-09 DIAGNOSIS — E876 Hypokalemia: Secondary | ICD-10-CM | POA: Diagnosis present

## 2023-10-09 DIAGNOSIS — R188 Other ascites: Secondary | ICD-10-CM | POA: Diagnosis present

## 2023-10-09 DIAGNOSIS — Z91148 Patient's other noncompliance with medication regimen for other reason: Secondary | ICD-10-CM

## 2023-10-09 DIAGNOSIS — R636 Underweight: Secondary | ICD-10-CM | POA: Diagnosis present

## 2023-10-09 DIAGNOSIS — K567 Ileus, unspecified: Secondary | ICD-10-CM | POA: Diagnosis present

## 2023-10-09 DIAGNOSIS — F209 Schizophrenia, unspecified: Secondary | ICD-10-CM | POA: Diagnosis present

## 2023-10-09 DIAGNOSIS — D6959 Other secondary thrombocytopenia: Secondary | ICD-10-CM | POA: Diagnosis present

## 2023-10-09 DIAGNOSIS — F10239 Alcohol dependence with withdrawal, unspecified: Secondary | ICD-10-CM | POA: Diagnosis present

## 2023-10-09 DIAGNOSIS — Z681 Body mass index (BMI) 19 or less, adult: Secondary | ICD-10-CM | POA: Diagnosis not present

## 2023-10-09 DIAGNOSIS — R54 Age-related physical debility: Secondary | ICD-10-CM | POA: Diagnosis present

## 2023-10-09 DIAGNOSIS — K8521 Alcohol induced acute pancreatitis with uninfected necrosis: Secondary | ICD-10-CM | POA: Diagnosis not present

## 2023-10-09 DIAGNOSIS — T510X1A Toxic effect of ethanol, accidental (unintentional), initial encounter: Secondary | ICD-10-CM | POA: Diagnosis present

## 2023-10-09 DIAGNOSIS — F4310 Post-traumatic stress disorder, unspecified: Secondary | ICD-10-CM | POA: Diagnosis present

## 2023-10-09 DIAGNOSIS — T50916A Underdosing of multiple unspecified drugs, medicaments and biological substances, initial encounter: Secondary | ICD-10-CM | POA: Diagnosis present

## 2023-10-09 DIAGNOSIS — Z800 Family history of malignant neoplasm of digestive organs: Secondary | ICD-10-CM

## 2023-10-09 DIAGNOSIS — K8522 Alcohol induced acute pancreatitis with infected necrosis: Secondary | ICD-10-CM | POA: Diagnosis not present

## 2023-10-09 DIAGNOSIS — R64 Cachexia: Secondary | ICD-10-CM | POA: Diagnosis present

## 2023-10-09 DIAGNOSIS — K55059 Acute (reversible) ischemia of intestine, part and extent unspecified: Secondary | ICD-10-CM | POA: Diagnosis present

## 2023-10-09 DIAGNOSIS — Z21 Asymptomatic human immunodeficiency virus [HIV] infection status: Secondary | ICD-10-CM | POA: Diagnosis present

## 2023-10-09 DIAGNOSIS — Z5902 Unsheltered homelessness: Secondary | ICD-10-CM | POA: Diagnosis not present

## 2023-10-09 DIAGNOSIS — Z5982 Transportation insecurity: Secondary | ICD-10-CM

## 2023-10-09 DIAGNOSIS — Z811 Family history of alcohol abuse and dependence: Secondary | ICD-10-CM

## 2023-10-09 DIAGNOSIS — Z560 Unemployment, unspecified: Secondary | ICD-10-CM

## 2023-10-09 LAB — CBC
HCT: 45.1 % (ref 39.0–52.0)
Hemoglobin: 15 g/dL (ref 13.0–17.0)
MCH: 32.5 pg (ref 26.0–34.0)
MCHC: 33.3 g/dL (ref 30.0–36.0)
MCV: 97.6 fL (ref 80.0–100.0)
Platelets: 292 10*3/uL (ref 150–400)
RBC: 4.62 MIL/uL (ref 4.22–5.81)
RDW: 14.3 % (ref 11.5–15.5)
WBC: 12.8 10*3/uL — ABNORMAL HIGH (ref 4.0–10.5)
nRBC: 0 % (ref 0.0–0.2)

## 2023-10-09 LAB — COMPREHENSIVE METABOLIC PANEL
ALT: 23 U/L (ref 0–44)
AST: 31 U/L (ref 15–41)
Albumin: 3.8 g/dL (ref 3.5–5.0)
Alkaline Phosphatase: 73 U/L (ref 38–126)
Anion gap: 13 (ref 5–15)
BUN: 5 mg/dL — ABNORMAL LOW (ref 6–20)
CO2: 22 mmol/L (ref 22–32)
Calcium: 8.5 mg/dL — ABNORMAL LOW (ref 8.9–10.3)
Chloride: 103 mmol/L (ref 98–111)
Creatinine, Ser: 0.56 mg/dL — ABNORMAL LOW (ref 0.61–1.24)
GFR, Estimated: >60 mL/min (ref >60)
Glucose, Bld: 83 mg/dL (ref 70–99)
Potassium: 3.3 mmol/L — ABNORMAL LOW (ref 3.5–5.1)
Sodium: 138 mmol/L (ref 135–145)
Total Bilirubin: 0.6 mg/dL (ref <1.2)
Total Protein: 7.5 g/dL (ref 6.5–8.1)

## 2023-10-09 LAB — LIPASE, BLOOD: Lipase: 379 U/L — ABNORMAL HIGH (ref 11–51)

## 2023-10-09 MED ORDER — OXYCODONE HCL 5 MG PO TABS
5.0000 mg | ORAL_TABLET | ORAL | Status: DC | PRN
  Administered 2023-10-10: 5 mg via ORAL
  Filled 2023-10-09: qty 1

## 2023-10-09 MED ORDER — ENOXAPARIN SODIUM 40 MG/0.4ML IJ SOSY
40.0000 mg | PREFILLED_SYRINGE | INTRAMUSCULAR | Status: DC
  Administered 2023-10-10 – 2023-10-11 (×2): 40 mg via SUBCUTANEOUS
  Filled 2023-10-09 (×2): qty 0.4

## 2023-10-09 MED ORDER — LORAZEPAM 2 MG/ML IJ SOLN: 0.0000 mg | Freq: Two times a day (BID) | INTRAMUSCULAR | Status: DC

## 2023-10-09 MED ORDER — FOLIC ACID 1 MG PO TABS
1.0000 mg | ORAL_TABLET | Freq: Every day | ORAL | Status: DC
Start: 2023-10-10 — End: 2023-10-15
  Administered 2023-10-10 – 2023-10-15 (×6): 1 mg via ORAL
  Filled 2023-10-09 (×6): qty 1

## 2023-10-09 MED ORDER — SENNOSIDES-DOCUSATE SODIUM 8.6-50 MG PO TABS: 1.0000 | ORAL_TABLET | Freq: Every evening | ORAL | Status: DC | PRN

## 2023-10-09 MED ORDER — ACETAMINOPHEN 325 MG PO TABS
650.0000 mg | ORAL_TABLET | Freq: Four times a day (QID) | ORAL | Status: DC | PRN
  Administered 2023-10-10 – 2023-10-12 (×4): 650 mg via ORAL
  Filled 2023-10-09 (×4): qty 2

## 2023-10-09 MED ORDER — ACETAMINOPHEN 650 MG RE SUPP: 650.0000 mg | Freq: Four times a day (QID) | RECTAL | Status: DC | PRN

## 2023-10-09 MED ORDER — DIPHENHYDRAMINE HCL 50 MG/ML IJ SOLN
25.0000 mg | Freq: Once | INTRAMUSCULAR | Status: AC
  Administered 2023-10-09: 25 mg via INTRAVENOUS
  Filled 2023-10-09: qty 1

## 2023-10-09 MED ORDER — ONDANSETRON HCL 4 MG/2ML IJ SOLN
4.0000 mg | Freq: Four times a day (QID) | INTRAMUSCULAR | Status: DC | PRN
  Administered 2023-10-11 – 2023-10-14 (×6): 4 mg via INTRAVENOUS
  Filled 2023-10-09 (×6): qty 2

## 2023-10-09 MED ORDER — ADULT MULTIVITAMIN W/MINERALS CH
1.0000 | ORAL_TABLET | Freq: Every day | ORAL | Status: DC
  Administered 2023-10-10 – 2023-10-15 (×6): 1 via ORAL
  Filled 2023-10-09 (×6): qty 1

## 2023-10-09 MED ORDER — POTASSIUM CHLORIDE 10 MEQ/100ML IV SOLN
10.0000 meq | INTRAVENOUS | Status: AC
  Administered 2023-10-10 (×3): 10 meq via INTRAVENOUS
  Filled 2023-10-09 (×4): qty 100

## 2023-10-09 MED ORDER — LORAZEPAM 2 MG/ML IJ SOLN
0.0000 mg | Freq: Four times a day (QID) | INTRAMUSCULAR | Status: DC
  Administered 2023-10-10: 1 mg via INTRAVENOUS
  Filled 2023-10-09: qty 1

## 2023-10-09 MED ORDER — DIAZEPAM 5 MG/ML IJ SOLN
10.0000 mg | Freq: Once | INTRAMUSCULAR | Status: AC
  Administered 2023-10-09: 10 mg via INTRAVENOUS
  Filled 2023-10-09: qty 2

## 2023-10-09 MED ORDER — DROPERIDOL 2.5 MG/ML IJ SOLN
1.2500 mg | Freq: Once | INTRAMUSCULAR | Status: AC
  Administered 2023-10-09: 1.25 mg via INTRAVENOUS
  Filled 2023-10-09: qty 2

## 2023-10-09 MED ORDER — THIAMINE HCL 100 MG/ML IJ SOLN: 100.0000 mg | Freq: Every day | INTRAMUSCULAR | Status: DC

## 2023-10-09 MED ORDER — ONDANSETRON HCL 4 MG PO TABS
4.0000 mg | ORAL_TABLET | Freq: Four times a day (QID) | ORAL | Status: DC | PRN
  Administered 2023-10-10: 4 mg via ORAL
  Filled 2023-10-09: qty 1

## 2023-10-09 MED ORDER — SODIUM CHLORIDE 0.9 % IV BOLUS
1000.0000 mL | Freq: Once | INTRAVENOUS | Status: AC
  Administered 2023-10-09: 1000 mL via INTRAVENOUS

## 2023-10-09 MED ORDER — FAMOTIDINE IN NACL 20-0.9 MG/50ML-% IV SOLN
20.0000 mg | Freq: Once | INTRAVENOUS | Status: AC
  Administered 2023-10-09: 20 mg via INTRAVENOUS
  Filled 2023-10-09: qty 50

## 2023-10-09 MED ORDER — LEVETIRACETAM 500 MG PO TABS
500.0000 mg | ORAL_TABLET | Freq: Two times a day (BID) | ORAL | Status: DC
  Administered 2023-10-10 – 2023-10-15 (×12): 500 mg via ORAL
  Filled 2023-10-09 (×12): qty 1

## 2023-10-09 MED ORDER — PANTOPRAZOLE SODIUM 40 MG PO TBEC
40.0000 mg | DELAYED_RELEASE_TABLET | Freq: Every day | ORAL | Status: DC
  Administered 2023-10-10 – 2023-10-15 (×6): 40 mg via ORAL
  Filled 2023-10-09 (×6): qty 1

## 2023-10-09 MED ORDER — LORAZEPAM 2 MG/ML IJ SOLN: 1.0000 mg | INTRAMUSCULAR | Status: AC | PRN

## 2023-10-09 MED ORDER — HYDROMORPHONE HCL 1 MG/ML IJ SOLN
0.5000 mg | INTRAMUSCULAR | Status: DC | PRN
Start: 2023-10-09 — End: 2023-10-10
  Administered 2023-10-10 (×2): 1 mg via INTRAVENOUS
  Filled 2023-10-09 (×2): qty 1

## 2023-10-09 MED ORDER — HYDROMORPHONE HCL 1 MG/ML IJ SOLN
1.0000 mg | Freq: Once | INTRAMUSCULAR | Status: AC
  Administered 2023-10-09: 1 mg via INTRAVENOUS
  Filled 2023-10-09: qty 1

## 2023-10-09 MED ORDER — LACTATED RINGERS IV SOLN
INTRAVENOUS | Status: AC
Start: 2023-10-09 — End: 2023-10-10

## 2023-10-09 MED ORDER — LORAZEPAM 1 MG PO TABS
1.0000 mg | ORAL_TABLET | ORAL | Status: AC | PRN
  Administered 2023-10-10: 1 mg via ORAL
  Administered 2023-10-10: 2 mg via ORAL
  Administered 2023-10-10 (×2): 1 mg via ORAL
  Administered 2023-10-11 (×4): 2 mg via ORAL
  Administered 2023-10-12: 1 mg via ORAL
  Filled 2023-10-09 (×2): qty 2
  Filled 2023-10-09 (×2): qty 1
  Filled 2023-10-09: qty 2
  Filled 2023-10-09: qty 1
  Filled 2023-10-09 (×2): qty 2
  Filled 2023-10-09: qty 1

## 2023-10-09 MED ORDER — THIAMINE MONONITRATE 100 MG PO TABS
100.0000 mg | ORAL_TABLET | Freq: Every day | ORAL | Status: DC
  Administered 2023-10-10 – 2023-10-15 (×6): 100 mg via ORAL
  Filled 2023-10-09 (×6): qty 1

## 2023-10-09 NOTE — ED Notes (Signed)
Pt able to sit up on side of bed and stand with assistance, reports 10/10 generalized abdominal pain. VSS at this time.

## 2023-10-09 NOTE — ED Notes (Signed)
..ED TO INPATIENT HANDOFF REPORT  Name/Age/Gender Gary [name] 37 y.o. male  Code Status    Code Status Orders  (From admission, onward)           Start     Ordered   10/09/23 2336  Full code  Continuous       Question:  By:  Answer:  Consent: discussion documented in EHR   10/09/23 2336           Code Status History     Date Active Date Inactive Code Status Order ID Comments User Context   10/01/2023 0750 10/03/2023 1927 Full Code [id number]  Maryln Gottron, MD ED   09/25/2023 0437 09/30/2023 2027 Full Code [id number]  Howerter, Chaney Born, DO ED   08/22/2023 2149 09/01/2023 1905 Full Code [id number]  Charlsie Quest, MD ED   07/16/2023 2225 07/19/2023 0025 Full Code [id number]  Anselm Jungling, DO ED   06/11/2023 0742 06/15/2023 1947 Full Code [id number]  Maryln Gottron, MD ED   05/24/2023 1817 05/29/2023 2224 Full Code [id number]  Lurline Del, MD ED   12/30/2022 2215 12/31/2022 1512 Full Code [id number]  [name], PA-C ED   12/23/2022 0422 12/26/2022 1044 Full Code [id number]  Tim Lair, PA-C ED   12/14/2022 1532 12/22/2022 1941 Full Code [id number]  Teddy Spike, DO ED   11/07/2022 2321 11/08/2022 1522 Full Code [id number]  [name], PA-C ED   10/16/2022 2251 10/23/2022 1712 Full Code [id number]  Charlsie Quest, MD ED   10/16/2022 1707 10/16/2022 1800 Full Code [id number]  Jackelyn Poling, NP Inpatient   09/11/2022 2015 09/13/2022 1644 Full Code [id number]  Belva Agee, MD ED   06/18/2022 2328 06/19/2022 2240 Full Code [id number]  Gilles Chiquito, MD ED   06/11/2022 2314 06/12/2022 1121 Full Code [id number]  Shon Baton, MD ED   05/28/2022 0346 06/01/2022 1913 Full Code [id number]  Carollee Herter, DO ED   05/20/2022 2019 05/21/2022 1727 Full Code [id number]  Ernie Avena, MD ED   04/25/2022 2012 05/03/2022 1757 Full Code [id number]  Darlin Drop, DO ED   04/16/2022 1415 04/23/2022 1911 Full Code [id number]  Clydie Braun, MD ED   04/16/2022 1033 04/16/2022 1415 Full Code  [id number]  Jeannie Fend, PA-C ED   01/20/2022 0849 01/27/2022 1949 Full Code [id number]  Clydie Braun, MD ED   01/20/2022 0440 01/20/2022 0849 Full Code [id number]  Carroll Sage, PA-C ED   12/31/2021 0738 01/06/2022 2055 Full Code [id number]  Bobette Mo, MD ED   12/31/2021 0736 12/31/2021 0738 Full Code [id number]  Bobette Mo, MD ED   12/31/2021 0053 12/31/2021 0735 Full Code [id number]  Shon Baton, MD ED   11/06/2021 1540 11/08/2021 2241 Full Code [id number]  Janeann Forehand D, NP ED   11/06/2021 0200 11/06/2021 1540 Full Code [id number]  Paula Libra, MD ED   09/23/2021 0025 09/23/2021 1818 Full Code [id number]  Alvira Monday, MD ED   09/05/2021 2238 09/06/2021 1111 Full Code [id number]  Gerhard Munch, MD ED   09/05/2021 1507 09/05/2021 2238 Full Code [id number]  Karrie Meres, PA-C ED   08/26/2021 1556 08/30/2021 1846 Full Code [id number]  Estella Husk, MD ED   08/25/2021 2252 08/26/2021 1545 Full Code [id number]  Elpidio Anis, PA-C ED   07/26/2021 0108 07/31/2021 1948 Full Code [id number]  Loletta Parish, NP Inpatient   07/24/2021 2309 07/26/2021 0045 Full Code [id number]  Henderly, Britni A,  PA-C ED   07/11/2021 2129 07/13/2021 1757 Full Code [id number]  Linwood Dibbles, MD ED   07/08/2021 0519 07/08/2021 1134 Full Code [id number]  Shon Baton, MD ED   06/28/2021 2319 06/29/2021 0621 Full Code [id number]  Maia Plan, MD ED   04/30/2021 0905 05/01/2021 1150 Full Code [id number]  Teddy Spike, DO Inpatient   04/23/2021 1201 04/23/2021 2331 Full Code [id number]  Jeannie Fend, PA-C ED   04/07/2021 2151 04/08/2021 1516 Full Code [id number]  Jackelyn Poling, NP ED   04/06/2021 2256 04/07/2021 1108 Full Code [id number]  Linwood Dibbles, MD ED   03/11/2021 1520 03/19/2021 1925 Full Code [id number]  Eliezer Bottom, MD ED   03/08/2021 0701 03/09/2021 1757 Full Code [id number]  Petrucelli, Pleas Koch, PA-C ED   03/06/2021 2129 03/07/2021 0952 Full Code [id number]  Sponseller, Eugene Gavia,  PA-C ED   02/24/2021 0116 02/24/2021 1107 Full Code [id number]  Petrucelli, Pleas Koch, PA-C ED   02/13/2021 0041 02/13/2021 1245 Full Code [id number]  Milagros Loll, MD ED   02/11/2021 1916 02/12/2021 1555 Full Code [id number]  Samson Frederic, Cortni S, PA-C ED   01/11/2021 2100 01/12/2021 1150 Full Code [id number]  Gerhard Munch, MD ED   12/30/2020 1949 12/31/2020 1257 Full Code [id number]  Chesley Noon, MD ED   12/29/2020 2000 12/30/2020 1235 Full Code [id number]  Sharman Cheek, MD ED   12/09/2020 0420 12/09/2020 1534 Full Code [id number]  Eduard Clos, MD ED   12/09/2020 0131 12/09/2020 0420 Full Code [id number]  Roxy Horseman, PA-C ED   12/05/2020 1448 12/08/2020 1301 Full Code [id number]  Leeroy Bock, DO ED   12/05/2020 1200 12/05/2020 1448 Full Code [id number]  Horton, Clabe Seal, DO ED   10/20/2020 0016 11/09/2020 1546 Full Code [id number]  Irean Hong, MD ED   10/19/2020 0345 10/19/2020 2211 Full Code [id number]  Andris Baumann, MD ED   10/18/2020 2122 10/19/2020 0345 Full Code [id number]  Merwyn Katos, MD ED   10/07/2020 1127 10/08/2020 2023 Full Code [id number]  Chesley Noon, MD ED   09/28/2020 0324 09/29/2020 1457 Full Code [id number]  Shaune Pollack, MD ED   09/26/2020 0950 09/27/2020 1521 Full Code [id number]  Minna Antis, MD ED   09/26/2020 0728 09/26/2020 0950 Full Code [id number]  Nita Sickle, MD ED   07/04/2020 1040 07/05/2020 2139 Full Code [id number]  Jerald Kief, MD ED   06/23/2020 2240 06/25/2020 1923 Full Code [id number]  Jeannie Fend, PA-C ED   06/14/2020 0003 06/18/2020 1749 Full Code [id number]  Venora Maples, MD ED   06/09/2020 1108 06/09/2020 2025 Full Code [id number]  Lanae Boast, MD ED   06/08/2020 1548 06/08/2020 1942 Full Code [id number]  Jackelyn Poling, NP Inpatient   06/07/2020 1758 06/08/2020 1543 Full Code [id number]  Little, Ambrose Finland, MD ED   05/05/2020 0254 05/09/2020 1715 Full Code [id number]  Hillary Bow, DO ED   04/17/2020 2319 04/19/2020 0306 Full Code  [id number]  Charlsie Quest, MD ED   04/17/2020 1915 04/17/2020 2319 Full Code [id number]  Henderly, Britni A, PA-C ED   07/25/2019 1008 07/30/2019 2058 Full Code [id number]  Clydie Braun, MD ED   07/02/2019 0905 07/03/2019 1445 Full Code [id number]  Clydie Braun, MD ED   05/12/2019 1839 05/13/2019 1500 Full Code [id number]  Fayrene Helper, PA-C ED   04/16/2019 1809 04/17/2019 1841 Full Code [id number]  Charm Rings, NP Inpatient   04/16/2019 1238 04/16/2019 1710 Full Code [id number]  Jeanmarie Plant, MD ED   03/31/2019 0028 03/31/2019 1449 Full Code [id number]  Arnaldo Natal, MD ED   06/15/2018 2015 06/19/2018 1742 Full Code [id number]  Enid Baas, MD Inpatient   09/24/2016 0429 09/25/2016 1406 Full Code [id number]  Arnaldo Natal Inpatient       Home/SNF/Other Home  Chief Complaint 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]  Level of Care/Admitting Diagnosis ED Disposition     ED Disposition  Admit   Condition  --   Comment  Hospital Area: [facility] [[id number]]  Level of Care: Med-Surg [16]  May admit patient to [name] or [facility] if equivalent level of care is available:: Yes  Covid Evaluation: Asymptomatic - no recent exposure (last 10 days) testing not required  Diagnosis: Recurrent acute pancreatitis [518841]  Admitting Physician: Briscoe Deutscher [[id number]]  Attending Physician: Briscoe Deutscher [[id number]]  Certification:: I certify this patient will need inpatient services for at least 2 midnights  Expected Medical Readiness: 10/11/2023          Medical History Past Medical History:  Diagnosis Date   Alcohol abuse    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   Anxiety    Bipolar 2 disorder (HCC)    HIV (human immunodeficiency virus infection) (HCC)    Pancreatitis    PTSD (post-traumatic stress disorder)    Schizophrenia (HCC)    Seizures (HCC)    Subdural hematoma (HCC)     Allergies Allergies  Allergen Reactions   Tegretol [Carbamazepine] Other  (See Comments)    Caused vertigo for 2 days after taking it   Caffeine Palpitations    IV Location/Drains/Wounds Patient Lines/Drains/Airways Status     Active Line/Drains/Airways     Name Placement date Placement time Site Days   Peripheral IV 10/09/23 20 G Anterior;Distal;Right;Upper Arm 10/09/23  2050  Arm  less than 1            Labs/Imaging Results for orders placed or performed during the hospital encounter of 10/09/23 (from the past 48 hour(s))  Lipase, blood     Status: Abnormal   Collection Time: 10/09/23  8:44 PM  Result Value Ref Range   Lipase 379 (H) 11 - 51 U/L    Comment: Performed at [facility], [address], [place], [place] [address]  Comprehensive metabolic panel     Status: Abnormal   Collection Time: 10/09/23  8:44 PM  Result Value Ref Range   Sodium 138 135 - 145 mmol/L   Potassium 3.3 (L) 3.5 - 5.1 mmol/L   Chloride 103 98 - 111 mmol/L   CO2 22 22 - 32 mmol/L   Glucose, Bld 83 70 - 99 mg/dL    Comment: Glucose reference range applies only to samples taken after fasting for at least 8 hours.   BUN 5 (L) 6 - 20 mg/dL   Creatinine, Ser 2.20 (L) 0.61 - 1.24 mg/dL   Calcium 8.5 (L) 8.9 - 10.3 mg/dL   Total Protein 7.5 6.5 - 8.1 g/dL   Albumin 3.8 3.5 - 5.0 g/dL   AST 31 15 - 41 U/L   ALT 23 0 - 44 U/L   Alkaline Phosphatase 73 38 - 126 U/L   Total Bilirubin 0.6 <1.2 mg/dL   GFR, Estimated >25 >42 mL/min    Comment: (NOTE) Calculated using the CKD-EPI Creatinine Equation (2021)    Anion gap 13 5 - 15    Comment: Performed at [facility], [address]  [place], [place] [address]  CBC     Status: Abnormal   Collection Time: 10/09/23  8:44 PM  Result Value Ref Range   WBC 12.8 (H) 4.0 - 10.5 K/uL   RBC 4.62 4.22 - 5.81 MIL/uL   Hemoglobin 15.0 13.0 - 17.0 g/dL   HCT 32.4 40.1 - 02.7 %   MCV 97.6 80.0 - 100.0 fL   MCH 32.5 26.0 - 34.0 pg   MCHC 33.3 30.0 - 36.0 g/dL   RDW 25.3 66.4 - 40.3 %    Platelets 292 150 - 400 K/uL   nRBC 0.0 0.0 - 0.2 %    Comment: Performed at [facility], [address], [place], [place] [address]   No results found.  Pending Labs Unresulted Labs (From admission, onward)     Start     Ordered   10/16/23 0500  Creatinine, serum  (enoxaparin (LOVENOX)    CrCl >/= 30 ml/min)  Weekly,   R     Comments: while on enoxaparin therapy    10/09/23 2336   10/10/23 0500  Magnesium  Tomorrow morning,   R        10/09/23 2336   10/10/23 0500  Phosphorus  Tomorrow morning,   R        10/09/23 2336   10/10/23 0500  CBC  Daily,   R      10/09/23 2336   10/10/23 0500  Basic metabolic panel  Daily,   R      10/09/23 2336   10/09/23 2340  Triglycerides  Once,   R        10/09/23 2339            Vitals/Pain Today's Vitals   10/09/23 2245 10/09/23 2300 10/09/23 2315 10/09/23 2330  BP: 117/80 112/72 113/77 (!) 102/58  Pulse: 73 74 64 (!) 57  Resp: 10 10 14 12   Temp:      TempSrc:      SpO2: 100% 100% 100% 100%  Weight:      Height:      PainSc:        Isolation Precautions No active isolations  Medications Medications  pantoprazole (PROTONIX) EC tablet 40 mg (has no administration in time range)  levETIRAcetam (KEPPRA) tablet 500 mg (has no administration in time range)  LORazepam (ATIVAN) tablet 1-4 mg (has no administration in time range)    Or  LORazepam (ATIVAN) injection 1-4 mg (has no administration in time range)  thiamine (VITAMIN B1) tablet 100 mg (has no administration in time range)    Or  thiamine (VITAMIN B1) injection 100 mg (has no administration in time range)  folic acid (FOLVITE) tablet 1 mg (has no administration in time range)  multivitamin with minerals tablet 1 tablet (has no administration in time range)  potassium chloride 10 mEq in 100 mL IVPB (has no administration in time range)  LORazepam (ATIVAN) injection 0-4 mg (has no administration in time range)    Followed by  LORazepam (ATIVAN)  injection 0-4 mg (has no administration in time range)  enoxaparin (LOVENOX) injection 40 mg (has no administration in time range)  lactated ringers infusion (has no administration in time range)  acetaminophen (TYLENOL) tablet 650 mg (has no administration in time range)    Or  acetaminophen (TYLENOL) suppository 650 mg (has no administration in time range)  oxyCODONE (Oxy IR/ROXICODONE) immediate release tablet 5 mg (has no administration in time range)  HYDROmorphone (DILAUDID) injection 0.5-1 mg (has no  administration in time range)  senna-docusate (Senokot-S) tablet 1 tablet (has no administration in time range)  ondansetron (ZOFRAN) tablet 4 mg (has no administration in time range)    Or  ondansetron (ZOFRAN) injection 4 mg (has no administration in time range)  sodium chloride 0.9 % bolus 1,000 mL (0 mLs Intravenous Stopped 10/09/23 2223)  HYDROmorphone (DILAUDID) injection 1 mg (1 mg Intravenous Given 10/09/23 2108)  droperidol (INAPSINE) 2.5 MG/ML injection 1.25 mg (1.25 mg Intravenous Given 10/09/23 2106)  diphenhydrAMINE (BENADRYL) injection 25 mg (25 mg Intravenous Given 10/09/23 2108)  famotidine (PEPCID) IVPB 20 mg premix (0 mg Intravenous Stopped 10/09/23 2223)  diazepam (VALIUM) injection 10 mg (10 mg Intravenous Given 10/09/23 2109)    Mobility walks with person assist

## 2023-10-09 NOTE — ED Provider Notes (Signed)
[place] [facility] AT [facility] Provider Note   CSN: [id number] Arrival date & time: 10/09/23  2021     History  Chief Complaint  Patient presents with   Abdominal Pain    Gary [name] is a 37 y.o. male.  37 yo M with a chief complaints of recurrent abdominal pain.  Tells me it feels like when he had pancreatitis in the past.  Started having some nausea and vomiting as well.  He was able to get some alcohol into them yesterday but was not really able to today.  He drinks a whole bottle of vodka a day.  No fevers.  No diarrhea.   Abdominal Pain      Home Medications Prior to Admission medications   Medication Sig Start Date End Date Taking? Authorizing Provider  levETIRAcetam (KEPPRA) 500 MG tablet Take 1 tablet (500 mg total) by mouth 2 (two) times daily. 10/03/23   Rhetta Mura, MD  pantoprazole (PROTONIX) 40 MG tablet Take 1 tablet (40 mg total) by mouth daily. 10/03/23   Rhetta Mura, MD  famotidine (PEPCID) 20 MG tablet Take 1 tablet (20 mg total) by mouth 2 (two) times daily. Patient not taking: Reported on 06/01/2019 05/14/19 06/02/19  Benjiman Core, MD      Allergies    Tegretol [carbamazepine] and Caffeine    Review of Systems   Review of Systems  Gastrointestinal:  Positive for abdominal pain.    Physical Exam Updated Vital Signs BP 117/80   Pulse 73   Temp 98.3 F (36.8 C) (Oral)   Resp 10   Ht 6' (1.829 m)   Wt 68 kg   SpO2 100%   BMI 20.34 kg/m  Physical Exam Vitals and nursing note reviewed.  Constitutional:      Appearance: He is well-developed.  HENT:     Head: Normocephalic and atraumatic.  Eyes:     Pupils: Pupils are equal, round, and reactive to light.  Neck:     Vascular: No JVD.  Cardiovascular:     Rate and Rhythm: Normal rate and regular rhythm.     Heart sounds: No murmur heard.    No friction rub. No gallop.  Pulmonary:     Effort: No respiratory distress.     Breath sounds: No  wheezing.  Abdominal:     General: There is no distension.     Tenderness: There is no abdominal tenderness. There is no guarding or rebound.     Comments: Patient flinches before I even touch his abdomen.  I do not appreciate any obvious guarding.  Abdomen is soft.  Musculoskeletal:        General: Normal range of motion.     Cervical back: Normal range of motion and neck supple.  Skin:    Coloration: Skin is not pale.     Findings: No rash.  Neurological:     Mental Status: He is alert and oriented to person, place, and time.  Psychiatric:        Behavior: Behavior normal.     ED Results / Procedures / Treatments   Labs (all labs ordered are listed, but only abnormal results are displayed) Labs Reviewed  LIPASE, BLOOD - Abnormal; Notable for the following components:      Result Value   Lipase 379 (*)    All other components within normal limits  COMPREHENSIVE METABOLIC PANEL - Abnormal; Notable for the following components:   Potassium 3.3 (*)    BUN  5 (*)    Creatinine, Ser 0.56 (*)    Calcium 8.5 (*)    All other components within normal limits  CBC - Abnormal; Notable for the following components:   WBC 12.8 (*)    All other components within normal limits    EKG None  Radiology No results found.  Procedures Procedures    Medications Ordered in ED Medications  sodium chloride 0.9 % bolus 1,000 mL (0 mLs Intravenous Stopped 10/09/23 2223)  HYDROmorphone (DILAUDID) injection 1 mg (1 mg Intravenous Given 10/09/23 2108)  droperidol (INAPSINE) 2.5 MG/ML injection 1.25 mg (1.25 mg Intravenous Given 10/09/23 2106)  diphenhydrAMINE (BENADRYL) injection 25 mg (25 mg Intravenous Given 10/09/23 2108)  famotidine (PEPCID) IVPB 20 mg premix (0 mg Intravenous Stopped 10/09/23 2223)  diazepam (VALIUM) injection 10 mg (10 mg Intravenous Given 10/09/23 2109)    ED Course/ Medical Decision Making/ A&P                                 Medical Decision Making Amount  and/or Complexity of Data Reviewed Labs: ordered.  Risk Prescription drug management.   37 yo M with a chief complaints of abdominal pain.  This has been an ongoing issue for him.  He has been admitted to the hospital multiple times for recurrent pancreatitis.  He thinks this feels similar.  Will check lab work here.  Attempt to treat his pain and nausea.  Will give a dose of Valium for possible con commitment alcohol withdrawal.  Lipase elevated.  Leukocytosis.  No LFT elevations.    Patient with ongoing pain on repeat assessment, still not tolerating by mouth will discuss with medicine.   The patients results and plan were reviewed and discussed.   Any x-rays performed were independently reviewed by myself.   Differential diagnosis were considered with the presenting HPI.  Medications  sodium chloride 0.9 % bolus 1,000 mL (0 mLs Intravenous Stopped 10/09/23 2223)  HYDROmorphone (DILAUDID) injection 1 mg (1 mg Intravenous Given 10/09/23 2108)  droperidol (INAPSINE) 2.5 MG/ML injection 1.25 mg (1.25 mg Intravenous Given 10/09/23 2106)  diphenhydrAMINE (BENADRYL) injection 25 mg (25 mg Intravenous Given 10/09/23 2108)  famotidine (PEPCID) IVPB 20 mg premix (0 mg Intravenous Stopped 10/09/23 2223)  diazepam (VALIUM) injection 10 mg (10 mg Intravenous Given 10/09/23 2109)    Vitals:   10/09/23 2145 10/09/23 2200 10/09/23 2215 10/09/23 2245  BP: 111/76 111/71 113/73 117/80  Pulse: 84 71 69 73  Resp: 11 11 10 10   Temp:      TempSrc:      SpO2: 99% 100% 99% 100%  Weight:      Height:        Final diagnoses:  Alcohol-induced acute pancreatitis, unspecified complication status    Admission/ observation were discussed with the admitting physician, patient and/or family and they are comfortable with the plan.         Final Clinical Impression(s) / ED Diagnoses Final diagnoses:  Alcohol-induced acute pancreatitis, unspecified complication status    Rx / DC Orders ED  Discharge Orders     None         Melene Plan, DO 10/09/23 2313

## 2023-10-09 NOTE — ED Triage Notes (Signed)
Pt BIBA, presenting with abd pain, primarily in epigastric region. Pt has hx of ETOH, pancreatitis. Pt reports pain has been ongoing for weeks, hasn't been able to keep food or liquids down today, states has not had any alcohol for this reason today. Pt reports drinking 1/2 gallon of alcohol daily on days he is not vomiting.  2 doses of 50 mcg fentanyl ( total) intranasal given prior to arrival for pain.

## 2023-10-09 NOTE — H&P (Signed)
History and Physical    Gary [name] ZOX:[id number] DOB: Apr 29, 1986 DOA: 10/09/2023  PCP: Patient, No Pcp Per   Patient coming from: Home   Chief Complaint: Abdominal pain, N/V   HPI: Gary [name] is a 37 y.o. male with medical history significant for alcohol abuse, seizures, pancreatitis, HIV, and nonadherence with medications, now presenting with abdominal pain, nausea, and vomiting.   Patient has been medicated and heavily sedated in the ED, limiting his ability to provide history at time of admission.  He had indicated to the ED physician that his current symptoms feel like when he had pancreatitis previously.  He stated that he typically consumes a bottle of vodka daily, was unable to drink alcohol today due to his GI symptoms, but was able to tolerate some yesterday.  ED Course: Upon arrival to the ED, patient is found to be afebrile and saturating well on room air with stable blood pressure.  Labs are most notable for lipase 379, potassium 3.3, normal renal function, normal LFTs, and WBC 12,800.   He was treated with 1 L NS bolus, Dilaudid, Valium, Benadryl, Pepcid, and droperidol in the ED.  Review of Systems:  Limited by patient's clinical condition.  Past Medical History:  Diagnosis Date   Alcohol abuse    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   Anxiety    Bipolar 2 disorder (HCC)    HIV (human immunodeficiency virus infection) (HCC)    Pancreatitis    PTSD (post-traumatic stress disorder)    Schizophrenia (HCC)    Seizures (HCC)    Subdural hematoma (HCC)     Past Surgical History:  Procedure Laterality Date   BIOPSY  04/19/2022   Procedure: BIOPSY;  Surgeon: Lemar Lofty., MD;  Location: [facility] ENDOSCOPY;  Service: Gastroenterology;;   ENTEROSCOPY N/A 04/19/2022   Procedure: ENTEROSCOPY;  Surgeon: Lemar Lofty., MD;  Location: [facility] ENDOSCOPY;  Service: Gastroenterology;  Laterality: N/A;   INCISION AND DRAINAGE PERIRECTAL ABSCESS N/A  09/24/2016   Procedure: IRRIGATION AND DEBRIDEMENT PERIRECTAL ABSCESS;  Surgeon: Ricarda Frame, MD;  Location: [facility] ORS;  Service: General;  Laterality: N/A;   none      Social History:   reports that he has been smoking cigarettes. He has never used smokeless tobacco. He reports current alcohol use of about 105.0 standard drinks of alcohol per week. He reports current drug use. Drug: Methamphetamines.  Allergies  Allergen Reactions   Tegretol [Carbamazepine] Other (See Comments)    Caused vertigo for 2 days after taking it   Caffeine Palpitations    Family History  Problem Relation Age of Onset   Alcohol abuse Mother    Alcohol abuse Father    Colon cancer Other    Other Other    Cancer Other      Prior to Admission medications   Medication Sig Start Date End Date Taking? Authorizing Provider  levETIRAcetam (KEPPRA) 500 MG tablet Take 1 tablet (500 mg total) by mouth 2 (two) times daily. 10/03/23   Rhetta Mura, MD  pantoprazole (PROTONIX) 40 MG tablet Take 1 tablet (40 mg total) by mouth daily. 10/03/23   Rhetta Mura, MD  famotidine (PEPCID) 20 MG tablet Take 1 tablet (20 mg total) by mouth 2 (two) times daily. Patient not taking: Reported on 06/01/2019 05/14/19 06/02/19  Benjiman Core, MD    Physical Exam: Vitals:   10/09/23 2215 10/09/23 2245 10/09/23 2300 10/09/23 2315  BP: 113/73 117/80 112/72 113/77  Pulse: 69 73 74 64  Resp: 10 10  10 14  Temp:      TempSrc:      SpO2: 99% 100% 100% 100%  Weight:      Height:        Constitutional: NAD, no pallor or diaphoresis   Eyes: PERTLA, lids and conjunctivae normal ENMT: Mucous membranes are moist. Posterior pharynx clear of any exudate or lesions.   Neck: supple, no masses  Respiratory: no wheezing, no crackles. No accessory muscle use.  Cardiovascular: S1 & S2 heard, regular rate and rhythm. No extremity edema.   Abdomen: No distension, soft, tender in epigastrium. Bowel sounds active.   Musculoskeletal: no clubbing / cyanosis. No joint deformity upper and lower extremities.   Skin: no significant rashes, lesions, ulcers. Warm, dry, well-perfused. Neurologic: CN 2-12 grossly intact. Moving all extremities. Somnolent, wakes to voice and slowly answers questions appropriately.    Labs and Imaging on Admission: I have personally reviewed following labs and imaging studies  CBC: Recent Labs  Lab 10/03/23 0522 10/09/23 2044  WBC 4.1 12.8*  HGB 11.2* 15.0  HCT 34.2* 45.1  MCV 100.6* 97.6  PLT 283 292   Basic Metabolic Panel: Recent Labs  Lab 10/03/23 0522 10/09/23 2044  NA 132* 138  K 3.3* 3.3*  CL 103 103  CO2 25 22  GLUCOSE 98 83  BUN 7 5*  CREATININE 0.60* 0.56*  CALCIUM 8.5* 8.5*   GFR: Estimated Creatinine Clearance: 121.6 mL/min (A) (by C-G formula based on SCr of 0.56 mg/dL (L)). Liver Function Tests: Recent Labs  Lab 10/03/23 0522 10/09/23 2044  AST 11* 31  ALT 13 23  ALKPHOS 60 73  BILITOT 0.4 0.6  PROT 5.6* 7.5  ALBUMIN 2.7* 3.8   Recent Labs  Lab 10/09/23 2044  LIPASE 379*   No results for input(s): "AMMONIA" in the last 168 hours. Coagulation Profile: No results for input(s): "INR", "PROTIME" in the last 168 hours. Cardiac Enzymes: No results for input(s): "CKTOTAL", "CKMB", "CKMBINDEX", "TROPONINI" in the last 168 hours. BNP (last 3 results) No results for input(s): "PROBNP" in the last 8760 hours. HbA1C: No results for input(s): "HGBA1C" in the last 72 hours. CBG: No results for input(s): "GLUCAP" in the last 168 hours. Lipid Profile: No results for input(s): "CHOL", "HDL", "LDLCALC", "TRIG", "CHOLHDL", "LDLDIRECT" in the last 72 hours. Thyroid Function Tests: No results for input(s): "TSH", "T4TOTAL", "FREET4", "T3FREE", "THYROIDAB" in the last 72 hours. Anemia Panel: No results for input(s): "VITAMINB12", "FOLATE", "FERRITIN", "TIBC", "IRON", "RETICCTPCT" in the last 72 hours. Urine analysis:    Component Value  Date/Time   COLORURINE YELLOW 09/25/2023 0003   APPEARANCEUR CLEAR 09/25/2023 0003   APPEARANCEUR Clear 11/15/2014 1755   LABSPEC 1.027 09/25/2023 0003   LABSPEC 1.002 11/15/2014 1755   PHURINE 6.0 09/25/2023 0003   GLUCOSEU NEGATIVE 09/25/2023 0003   GLUCOSEU Negative 11/15/2014 1755   HGBUR NEGATIVE 09/25/2023 0003   BILIRUBINUR NEGATIVE 09/25/2023 0003   BILIRUBINUR Negative 11/15/2014 1755   KETONESUR NEGATIVE 09/25/2023 0003   PROTEINUR NEGATIVE 09/25/2023 0003   NITRITE NEGATIVE 09/25/2023 0003   LEUKOCYTESUR NEGATIVE 09/25/2023 0003   LEUKOCYTESUR Negative 11/15/2014 1755   Sepsis Labs: @LABRCNTIP (procalcitonin:4,lacticidven:4) )No results found for this or any previous visit (from the past 240 hour(s)).   Radiological Exams on Admission: No results found.  EKG: Independently reviewed.   Assessment/Plan   1. Acute pancreatitis  - Continue bowel-rest, IVF hydration, and pain-control, check triglycerides, encourage alcohol avoidance going forward   2. Alcohol dependence  - Monitor with CIWA scoring,  use Ativan as needed, supplement vitamins, consult TOC   3. Hypokalemia  - Replacing   4. Hx of seizures - Resume Keppra    5. HIV  - CD4 was 248 and VL 47,000 in September 2024  - He was non-compliant with Biktarvy and it was discontinued during recent hospitalization  - Follow-up with ID on discharge    DVT prophylaxis: Lovenox  Code Status: Full  Level of Care: Level of care: Med-Surg Family Communication: None present   Disposition Plan:  Patient is from: Home Anticipated d/c is to: Home Anticipated d/c date is: 10/11/23  Patient currently: Pending pain-control, tolerance of adequate oral intake  Consults called: None  Admission status: Inpatient     Briscoe Deutscher, MD [facility]  10/09/2023, 11:36 PM

## 2023-10-10 ENCOUNTER — Inpatient Hospital Stay (HOSPITAL_COMMUNITY): Payer: MEDICAID

## 2023-10-10 DIAGNOSIS — K859 Acute pancreatitis without necrosis or infection, unspecified: Secondary | ICD-10-CM | POA: Diagnosis not present

## 2023-10-10 LAB — CBC
HCT: 43.5 % (ref 39.0–52.0)
Hemoglobin: 13.8 g/dL (ref 13.0–17.0)
MCH: 32.1 pg (ref 26.0–34.0)
MCHC: 31.7 g/dL (ref 30.0–36.0)
MCV: 101.2 fL — ABNORMAL HIGH (ref 80.0–100.0)
Platelets: 187 10*3/uL (ref 150–400)
RBC: 4.3 MIL/uL (ref 4.22–5.81)
RDW: 14.5 % (ref 11.5–15.5)
WBC: 12 10*3/uL — ABNORMAL HIGH (ref 4.0–10.5)
nRBC: 0 % (ref 0.0–0.2)

## 2023-10-10 LAB — BASIC METABOLIC PANEL
Anion gap: 11 (ref 5–15)
BUN: 5 mg/dL — ABNORMAL LOW (ref 6–20)
CO2: 21 mmol/L — ABNORMAL LOW (ref 22–32)
Calcium: 8 mg/dL — ABNORMAL LOW (ref 8.9–10.3)
Chloride: 106 mmol/L (ref 98–111)
Creatinine, Ser: 0.47 mg/dL — ABNORMAL LOW (ref 0.61–1.24)
GFR, Estimated: >60 mL/min (ref >60)
Glucose, Bld: 72 mg/dL (ref 70–99)
Potassium: 3.8 mmol/L (ref 3.5–5.1)
Sodium: 138 mmol/L (ref 135–145)

## 2023-10-10 LAB — TRIGLYCERIDES: Triglycerides: 94 mg/dL (ref <150)

## 2023-10-10 LAB — PHOSPHORUS: Phosphorus: 4.4 mg/dL (ref 2.5–4.6)

## 2023-10-10 LAB — LIPASE, BLOOD: Lipase: 279 U/L — ABNORMAL HIGH (ref 11–51)

## 2023-10-10 LAB — MAGNESIUM: Magnesium: 2.1 mg/dL (ref 1.7–2.4)

## 2023-10-10 MED ORDER — HYDROMORPHONE HCL 1 MG/ML IJ SOLN
0.5000 mg | INTRAMUSCULAR | Status: DC | PRN
  Administered 2023-10-10 (×3): 0.5 mg via INTRAVENOUS
  Filled 2023-10-10 (×3): qty 0.5

## 2023-10-10 MED ORDER — CHLORDIAZEPOXIDE HCL 25 MG PO CAPS: 25.0000 mg | ORAL_CAPSULE | Freq: Four times a day (QID) | ORAL | Status: DC | PRN

## 2023-10-10 MED ORDER — CHLORDIAZEPOXIDE HCL 25 MG PO CAPS
25.0000 mg | ORAL_CAPSULE | Freq: Three times a day (TID) | ORAL | Status: AC
  Administered 2023-10-11 – 2023-10-12 (×3): 25 mg via ORAL
  Filled 2023-10-10 (×3): qty 1

## 2023-10-10 MED ORDER — SENNOSIDES-DOCUSATE SODIUM 8.6-50 MG PO TABS
1.0000 | ORAL_TABLET | Freq: Two times a day (BID) | ORAL | Status: DC
  Administered 2023-10-10 – 2023-10-15 (×10): 1 via ORAL
  Filled 2023-10-10 (×11): qty 1

## 2023-10-10 MED ORDER — HYDROXYZINE HCL 25 MG PO TABS
25.0000 mg | ORAL_TABLET | Freq: Four times a day (QID) | ORAL | Status: DC | PRN
Start: 2023-10-10 — End: 2023-10-11
  Administered 2023-10-10 – 2023-10-11 (×3): 25 mg via ORAL
  Filled 2023-10-10 (×3): qty 1

## 2023-10-10 MED ORDER — CHLORDIAZEPOXIDE HCL 25 MG PO CAPS
25.0000 mg | ORAL_CAPSULE | ORAL | Status: AC
  Administered 2023-10-12 – 2023-10-13 (×2): 25 mg via ORAL
  Filled 2023-10-10 (×2): qty 1

## 2023-10-10 MED ORDER — IOHEXOL 300 MG/ML  SOLN
100.0000 mL | Freq: Once | INTRAMUSCULAR | Status: AC | PRN
  Administered 2023-10-10: 100 mL via INTRAVENOUS

## 2023-10-10 MED ORDER — HYDROMORPHONE HCL 1 MG/ML IJ SOLN
0.5000 mg | INTRAMUSCULAR | Status: DC | PRN
  Administered 2023-10-10 – 2023-10-12 (×11): 0.5 mg via INTRAVENOUS
  Filled 2023-10-10 (×11): qty 0.5

## 2023-10-10 MED ORDER — IOHEXOL 9 MG/ML PO SOLN: 500.0000 mL | ORAL | Status: DC

## 2023-10-10 MED ORDER — CHLORDIAZEPOXIDE HCL 25 MG PO CAPS
25.0000 mg | ORAL_CAPSULE | Freq: Every day | ORAL | Status: AC
  Administered 2023-10-13: 25 mg via ORAL
  Filled 2023-10-10: qty 1

## 2023-10-10 MED ORDER — OXYCODONE HCL 5 MG PO TABS
10.0000 mg | ORAL_TABLET | ORAL | Status: DC | PRN
  Administered 2023-10-10 – 2023-10-12 (×9): 10 mg via ORAL
  Filled 2023-10-10 (×10): qty 2

## 2023-10-10 MED ORDER — LOPERAMIDE HCL 2 MG PO CAPS: 2.0000 mg | ORAL_CAPSULE | ORAL | Status: AC | PRN

## 2023-10-10 MED ORDER — CHLORDIAZEPOXIDE HCL 25 MG PO CAPS
25.0000 mg | ORAL_CAPSULE | Freq: Four times a day (QID) | ORAL | Status: AC
  Administered 2023-10-10 – 2023-10-11 (×6): 25 mg via ORAL
  Filled 2023-10-10 (×6): qty 1

## 2023-10-10 MED ORDER — IOHEXOL 300 MG/ML  SOLN
30.0000 mL | Freq: Once | INTRAMUSCULAR | Status: AC | PRN
  Administered 2023-10-10: 30 mL via ORAL

## 2023-10-10 NOTE — Plan of Care (Signed)
  Problem: Education: Goal: Knowledge of General Education information will improve Description Including pain rating scale, medication(s)/side effects and non-pharmacologic comfort measures Outcome: Progressing   

## 2023-10-10 NOTE — Plan of Care (Signed)

## 2023-10-10 NOTE — Progress Notes (Addendum)
[facility] Progress Note Patient: Gary [name] QMV:[id number] DOB: Mar 28, 1986 DOA: 10/09/2023  DOS: the patient was seen and examined on 10/10/2023  Brief hospital course: PMH of alcohol abuse with recurrent pancreatitis, alcohol abuse seizures, HIV and noncompliance with medical regimen comes to the hospital with numbness of abdominal pain nausea and vomiting.  Lipase level is elevated and currently being treated for pancreatitis.  Assessment and Plan: Recurrent alcohol induced pancreatitis. Presents with abdominal pain. Test with that he was trying to make mixed drinks. Lipase was 300. Reports that his abdominal pain is more severe than his last admission. Was able to be controlled with morphine last admit but currently requesting Dilaudid. Will check CT abdomen to ensure there is no acute abnormality. Continue with n.p.o. status. Monitor.  Alcohol abuse. Appears to be having some withdrawal. Will repeat introduce Librium again. Continue IV Ativan. Monitor for now.  HIV. Noncompliant with medication. Has not been taking Biktarvy. Will request ID follow-up outpatient.  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 Resume.  leukocytosis. Likely stress-induced. For now we will monitor. Check CT abdomen.   Underweight. Adult failure to thrive. Placing the patient at high risk of poor outcome. Body mass index is 18.24 kg/m.   Subjective: Reports severe pain.  Requesting Dilaudid.  No nausea no vomiting.  Passing gas but no BM.  Physical Exam: General: in severe distress, No Rash, appears to be anxious and agitated Cardiovascular: S1 and S2 Present, No Murmur Respiratory: Good respiratory effort, Bilateral Air entry present. No Crackles, No wheezes Abdomen: Bowel Sound present, diffuse abdominal tenderness Extremities: No edema Neuro: Alert and oriented x3, no new focal deficit  Data Reviewed: I have Reviewed nursing notes, Vitals, and Lab results. Since last  encounter, pertinent lab results CBC and BMP   . I have ordered test including CBC and CMP  . I have ordered imaging CT abdomen  .   Disposition: Status is: Inpatient Remains inpatient appropriate because: Monitor for improvement in abdominal pain.  enoxaparin (LOVENOX) injection 40 mg Start: 10/10/23 1000   Family Communication: No one at bedside Level of care: Med-Surg   Vitals:   10/10/23 1328 10/10/23 1522 10/10/23 1704 10/10/23 1737  BP: 121/79 (!) 135/96 (!) 133/99 (!) 137/96  Pulse: 91 80 84 85  Resp: 16 20  17   Temp: 99.5 F (37.5 C) 98.8 F (37.1 C)  98.2 F (36.8 C)  TempSrc: Oral Oral    SpO2: 97% 98%  100%  Weight:      Height:         Author: Lynden Oxford, MD 10/10/2023 6:28 PM  Please look on [email or web address] to find out who is on call.

## 2023-10-11 DIAGNOSIS — K859 Acute pancreatitis without necrosis or infection, unspecified: Secondary | ICD-10-CM | POA: Diagnosis not present

## 2023-10-11 LAB — CBC
HCT: 34.3 % — ABNORMAL LOW (ref 39.0–52.0)
Hemoglobin: 11.4 g/dL — ABNORMAL LOW (ref 13.0–17.0)
MCH: 33.3 pg (ref 26.0–34.0)
MCHC: 33.2 g/dL (ref 30.0–36.0)
MCV: 100.3 fL — ABNORMAL HIGH (ref 80.0–100.0)
Platelets: 132 10*3/uL — ABNORMAL LOW (ref 150–400)
RBC: 3.42 MIL/uL — ABNORMAL LOW (ref 4.22–5.81)
RDW: 13.9 % (ref 11.5–15.5)
WBC: 4.3 10*3/uL (ref 4.0–10.5)
nRBC: 0 % (ref 0.0–0.2)

## 2023-10-11 LAB — COMPREHENSIVE METABOLIC PANEL
ALT: 17 U/L (ref 0–44)
AST: 23 U/L (ref 15–41)
Albumin: 3 g/dL — ABNORMAL LOW (ref 3.5–5.0)
Alkaline Phosphatase: 62 U/L (ref 38–126)
Anion gap: 9 (ref 5–15)
BUN: <5 mg/dL — ABNORMAL LOW (ref 6–20)
CO2: 25 mmol/L (ref 22–32)
Calcium: 8.5 mg/dL — ABNORMAL LOW (ref 8.9–10.3)
Chloride: 96 mmol/L — ABNORMAL LOW (ref 98–111)
Creatinine, Ser: 0.6 mg/dL — ABNORMAL LOW (ref 0.61–1.24)
GFR, Estimated: >60 mL/min (ref >60)
Glucose, Bld: 69 mg/dL — ABNORMAL LOW (ref 70–99)
Potassium: 3.8 mmol/L (ref 3.5–5.1)
Sodium: 130 mmol/L — ABNORMAL LOW (ref 135–145)
Total Bilirubin: 1.2 mg/dL — ABNORMAL HIGH (ref <1.2)
Total Protein: 5.9 g/dL — ABNORMAL LOW (ref 6.5–8.1)

## 2023-10-11 LAB — LIPASE, BLOOD: Lipase: 321 U/L — ABNORMAL HIGH (ref 11–51)

## 2023-10-11 LAB — MAGNESIUM: Magnesium: 1.7 mg/dL (ref 1.7–2.4)

## 2023-10-11 MED ORDER — LACTATED RINGERS IV SOLN: INTRAVENOUS | Status: AC

## 2023-10-11 MED ORDER — HYDROXYZINE HCL 25 MG PO TABS
25.0000 mg | ORAL_TABLET | Freq: Three times a day (TID) | ORAL | Status: DC
  Administered 2023-10-11 – 2023-10-15 (×13): 25 mg via ORAL
  Filled 2023-10-11 (×13): qty 1

## 2023-10-11 NOTE — Consult Note (Addendum)
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   Patient came to ED two days ago c/o abdominal pain, nausea, and vomiting.   Patient was evaluated in ED with labs and CT scan abd done.  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  Medical/psych history significant for schizophrenia, PTSD and seizures. Social history significant for heavy alcohol use. Admits to consumption of a bottle of vodka per day. Also admits to drug use w/meth.   Patient appears awake and alert today, however not answering all questions. States he's "okay" and just wants pain meds.    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  -will determine further treatment plan for discharge.    2. Recurrent acute pancreatitis -alcohol induced -continue pain management per Medicine -GI Eval done. Will be followed by outpatient GI   3. Thrombocytopenia and Anemia -likely due to alcohol use -platelets low 132k. Continue to monitor -hemoglobin 11. Continue to monitor.  -No transfusional support indicated at this time  4. HIV -continue meds per ID; Biktarvy  5. Alcohol +Drug use disorder -on CIWA protocol -continue meds as ordered.     Past Medical History:  Diagnosis Date   Alcohol abuse    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   Anxiety    Bipolar 2 disorder (HCC)    HIV (human immunodeficiency virus infection) (HCC)    Pancreatitis    PTSD (post-traumatic stress disorder)    Schizophrenia (HCC)    Seizures (HCC)    Subdural  hematoma (HCC)   :  Past Surgical History:  Procedure Laterality Date   BIOPSY  04/19/2022   Procedure: BIOPSY;  Surgeon: Lemar Lofty., MD;  Location: [facility] ENDOSCOPY;  Service: Gastroenterology;;   ENTEROSCOPY N/A 04/19/2022   Procedure: ENTEROSCOPY;  Surgeon: Lemar Lofty., MD;  Location: [facility] ENDOSCOPY;  Service: Gastroenterology;  Laterality: N/A;   INCISION AND DRAINAGE PERIRECTAL ABSCESS N/A 09/24/2016   Procedure: IRRIGATION AND DEBRIDEMENT PERIRECTAL ABSCESS;  Surgeon: Ricarda Frame, MD;  Location: [facility] ORS;  Service: General;  Laterality: N/A;   none    :  Allergies  Allergen Reactions   Tegretol [Carbamazepine] Other (See Comments)    Caused vertigo for 2 days after taking it   Caffeine Palpitations  :   Family History  Problem Relation Age of Onset   Alcohol abuse Mother    Alcohol abuse Father    Colon cancer Other    Other Other    Cancer Other   :   Social History   Socioeconomic History   Marital status: Single    Spouse name: Not on file   Number of children: Not on file   Years of education: Not on file   Highest education level: Not on file  Occupational History   Occupation: unemployed  Tobacco Use   Smoking status: Every Day    Current packs/day: 0.50    Types: Cigarettes   Smokeless tobacco: Never   Tobacco comments:    unable to smoke while incarcerated 6+ months 02/13/20  Vaping Use   Vaping status: Never Used  Substance and Sexual Activity  Alcohol use: Yes    Alcohol/week: 105.0 standard drinks of alcohol    Types: 105 Cans of beer per week    Comment: drinks every day, all day, "whatever I can get my hands on"   Drug use: Yes    Types: Methamphetamines    Comment: last used 04/10/2019   Sexual activity: Yes    Partners: Male, Male    Comment: declined condoms  03/2021  Other Topics Concern   Not on file  Social History Narrative   "Currently living on the streets"   Independent at baseline   Occasionally goes  to the [facility]   Social Determinants of Health   Financial Resource Strain: Patient Declined (10/11/2023)   Overall Financial Resource Strain (CARDIA)    Difficulty of Paying Living Expenses: Patient declined  Food Insecurity: Patient Declined (10/11/2023)   Hunger Vital Sign    Worried About Running Out of Food in the Last Year: Patient declined    Ran Out of Food in the Last Year: Patient declined  Recent Concern: Food Insecurity - Food Insecurity Present (10/10/2023)   Hunger Vital Sign    Worried About Running Out of Food in the Last Year: Sometimes true    Ran Out of Food in the Last Year: Sometimes true  Transportation Needs: Unmet Transportation Needs (10/11/2023)   PRAPARE - Transportation    Lack of Transportation (Medical): Yes    Lack of Transportation (Non-Medical): Yes  Physical Activity: Not on file  Stress: No Stress Concern Present (06/30/2022)   Received from [facility], [facility]   [employer] of Occupational Health - Occupational Stress Questionnaire    Feeling of Stress : Not at all  Recent Concern: Stress - Stress Concern Present (06/25/2022)   Received from [facility] of Occupational Health - Occupational Stress Questionnaire    Feeling of Stress : Rather much  Social Connections: Unknown (06/24/2022)   Received from [facility], [facility]   Social Network    Social Network: Not on file  Intimate Partner Violence: Not At Risk (10/10/2023)   Humiliation, Afraid, Rape, and Kick questionnaire    Fear of Current or Ex-Partner: No    Emotionally Abused: No    Physically Abused: No    Sexually Abused: No  :   CURRENT MEDS: Current Facility-Administered Medications  Medication Dose Route Frequency Provider Last Rate Last Admin   acetaminophen (TYLENOL) tablet 650 mg  650 mg Oral Q6H PRN Opyd, Lavone Neri, MD   650 mg at 10/10/23 1709   Or   acetaminophen (TYLENOL) suppository 650 mg  650 mg Rectal Q6H PRN Opyd, Lavone Neri, MD        chlordiazePOXIDE (LIBRIUM) capsule 25 mg  25 mg Oral QID Rolly Salter, MD   25 mg at 10/11/23 1205   Followed by   chlordiazePOXIDE (LIBRIUM) capsule 25 mg  25 mg Oral TID Rolly Salter, MD       Followed by   Melene Muller ON 10/12/2023] chlordiazePOXIDE (LIBRIUM) capsule 25 mg  25 mg Oral BH-qamhs Rolly Salter, MD       Followed by   Melene Muller ON 10/13/2023] chlordiazePOXIDE (LIBRIUM) capsule 25 mg  25 mg Oral Daily Rolly Salter, MD       enoxaparin (LOVENOX) injection 40 mg  40 mg Subcutaneous Q24H Opyd, Lavone Neri, MD   40 mg at 10/11/23 0813   folic acid (FOLVITE) tablet 1 mg  1 mg Oral Daily Opyd, Marcial Pacas  S, MD   1 mg at 10/11/23 0813   HYDROmorphone (DILAUDID) injection 0.5 mg  0.5 mg Intravenous Q3H PRN Luiz Iron, NP   0.5 mg at 10/11/23 1250   hydrOXYzine (ATARAX) tablet 25 mg  25 mg Oral TID Rolly Salter, MD   25 mg at 10/11/23 1401   lactated ringers infusion   Intravenous Continuous Rolly Salter, MD 150 mL/hr at 10/11/23 1021 New Bag at 10/11/23 1021   levETIRAcetam (KEPPRA) tablet 500 mg  500 mg Oral BID Briscoe Deutscher, MD   500 mg at 10/11/23 4098   loperamide (IMODIUM) capsule 2-4 mg  2-4 mg Oral PRN Rolly Salter, MD       LORazepam (ATIVAN) tablet 1-4 mg  1-4 mg Oral Q1H PRN Briscoe Deutscher, MD   2 mg at 10/11/23 1191   Or   LORazepam (ATIVAN) injection 1-4 mg  1-4 mg Intravenous Q1H PRN Opyd, Lavone Neri, MD       multivitamin with minerals tablet 1 tablet  1 tablet Oral Daily Opyd, Lavone Neri, MD   1 tablet at 10/11/23 0812   ondansetron (ZOFRAN) tablet 4 mg  4 mg Oral Q6H PRN Opyd, Lavone Neri, MD   4 mg at 10/10/23 1709   Or   ondansetron (ZOFRAN) injection 4 mg  4 mg Intravenous Q6H PRN Opyd, Lavone Neri, MD       oxyCODONE (Oxy IR/ROXICODONE) immediate release tablet 10 mg  10 mg Oral Q4H PRN Rolly Salter, MD   10 mg at 10/11/23 1401   pantoprazole (PROTONIX) EC tablet 40 mg  40 mg Oral Daily Opyd, Lavone Neri, MD   40 mg at 10/11/23 4782   senna-docusate  (Senokot-S) tablet 1 tablet  1 tablet Oral BID Rolly Salter, MD   1 tablet at 10/11/23 9562   thiamine (VITAMIN B1) tablet 100 mg  100 mg Oral Daily Opyd, Lavone Neri, MD   100 mg at 10/11/23 1308   Or   thiamine (VITAMIN B1) injection 100 mg  100 mg Intravenous Daily Opyd, Lavone Neri, MD        REVIEW OF SYSTEMS:   Constitutional: Denies fevers, chills or abnormal night sweats Eyes: Denies blurriness of vision, double vision or watery eyes Ears, nose, mouth, throat, and face: Denies mucositis or sore throat Respiratory: Denies cough, dyspnea or wheezes Cardiovascular: Denies palpitation, chest discomfort or lower extremity swelling Gastrointestinal: +abd pain.  Denies nausea, heartburn or change in bowel habits Skin: Denies abnormal skin rashes Lymphatics: Denies new lymphadenopathy or easy bruising Neurological:Denies numbness, tingling or new weaknesses Behavioral/Psych: Mood is stable, no new changes  All other systems were reviewed with the patient and are negative.  PHYSICAL EXAMINATION: ECOG PERFORMANCE STATUS: 1 - Symptomatic but completely ambulatory  Vitals:   10/11/23 1032 10/11/23 1315  BP: 125/70 107/74  Pulse: 90 81  Resp:  16  Temp:  98 F (36.7 C)  SpO2:  94%   Filed Weights   10/09/23 2032 10/10/23 0500  Weight: 150 lb (68 kg) 134 lb 7.7 oz (61 kg)    GENERAL:alert, no distress and comfortable SKIN: skin color, texture, turgor are normal, no rashes or significant lesions EYES: normal, conjunctiva are pink and non-injected, sclera clear OROPHARYNX:no exudate, no erythema and lips, buccal mucosa, and tongue normal  NECK: supple, thyroid normal size, non-tender, without nodularity LYMPH:  no palpable lymphadenopathy in the cervical, axillary or inguinal LUNGS: clear to auscultation and percussion with normal breathing  effort HEART: regular rate & rhythm and no murmurs and no lower extremity edema ABDOMEN:abdomen soft, non-tender and normal bowel  sounds Musculoskeletal:no cyanosis of digits and no clubbing  PSYCH: alert & oriented x 3 with fluent speech NEURO: no focal motor/sensory deficits   LABS:  Lab Results  Component Value Date   WBC 4.3 10/11/2023   HGB 11.4 (L) 10/11/2023   HCT 34.3 (L) 10/11/2023   PLT 132 (L) 10/11/2023   GLUCOSE 69 (L) 10/11/2023   CHOL 162 12/15/2022   TRIG 94 10/10/2023   HDL 79 12/15/2022   LDLCALC 73 12/15/2022   ALT 17 10/11/2023   AST 23 10/11/2023   NA 130 (L) 10/11/2023   K 3.8 10/11/2023   CL 96 (L) 10/11/2023   CREATININE 0.60 (L) 10/11/2023   BUN <5 (L) 10/11/2023   CO2 25 10/11/2023   INR 1.1 09/26/2023   HGBA1C 5.0 05/25/2023    CT ABDOMEN PELVIS W CONTRAST  Result Date: 10/10/2023 CLINICAL DATA:  Pancreatitis suspected EXAM: CT ABDOMEN AND PELVIS WITH CONTRAST TECHNIQUE: Multidetector CT imaging of the abdomen and pelvis was performed using the standard protocol following bolus administration of intravenous contrast. RADIATION DOSE REDUCTION: This exam was performed according to the departmental dose-optimization program which includes automated exposure control, adjustment of the mA and/or kV according to patient size and/or use of iterative reconstruction technique. CONTRAST:  OMNIPAQUE IOHEXOL 300 MG/ML  SOLN COMPARISON:  CT abdomen and pelvis 09/25/2023 FINDINGS: Lower chest: No acute abnormality. Hepatobiliary: There is diffuse fatty infiltration of the liver. No focal liver lesions are seen. The gallbladder is distended. No gallstones are identified. There is no biliary ductal dilatation. Pancreas: There is fat stranding and fluid surrounding the pancreas, predominantly of the head of the pancreas, which has increased from prior. There is ill-defined hypodensity in the pancreatic head measuring 1.9 by 0.6 cm similar to prior. No pancreatic ductal dilatation. Spleen: Mildly enlarged, unchanged. Adrenals/Urinary Tract: Adrenal glands are unremarkable. Kidneys are normal,  without renal calculi, focal lesion, or hydronephrosis. Bladder is unremarkable. Stomach/Bowel: There are focal areas of wall thickening inflammation of the proximal transverse colon and hepatic flexure. Appendix is within normal limits. There are dilated small bowel loops in the left abdomen measuring up to 4.8 cm. No definitive transition point visualized, but there are decompressed distal small bowel loops. Stomach is distended with contrast and air. There is wall thickening of the duodenal as it approximates the pancreatic head. Vascular/Lymphatic: Aorta and IVC are normal in size. 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 The portal vein appears small in size, a new finding. Compression or thrombus not excluded at this level. Right and left portal veins appear patent. Splenic vein is patent. 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 No enlarged lymph nodes are seen. Reproductive: Prostate gland is enlarged. Other: There is a new small amount of ascites throughout the abdomen and pelvis. Musculoskeletal: No acute or significant osseous findings. IMPRESSION: 1. Findings compatible with acute pancreatitis. Fluid and inflammation has increased compared to prior. 2. There is ill-defined hypodensity in the pancreatic head which may represent a small pseudocyst, but underlying mass is not excluded. Recommend follow-up MRI after resolution of acute symptoms. 3. New small amount of ascites throughout the abdomen and pelvis. 4. Dilated small bowel loops in the left abdomen measuring up to 4.8 cm. No definitive transition point visualized, but there are decompressed distal small bowel loops. Findings may represent ileus or partial small bowel obstruction. 5. Questionable nonocclusive  superior mesenteric vein thrombus. 6. The portal vein appears small in size, a new finding. Compression or thrombus not excluded at this level. 7. Focal areas of wall thickening and inflammation of the proximal  transverse colon and hepatic flexure. Findings may be reactive, but colitis is not excluded. 8. Fatty infiltration of the liver. 9. Splenomegaly. Electronically Signed   By: Darliss Cheney M.D.   On: 10/10/2023 19:48   DG ABD ACUTE 2+V W 1V CHEST  Result Date: 09/30/2023 CLINICAL DATA:  Abdominal pain. EXAM: DG ABDOMEN ACUTE WITH 1 VIEW CHEST COMPARISON:  Radiographs earlier today, CT 09/25/2023 FINDINGS: Unchanged elevation of left hemidiaphragm. Left lung base atelectasis/scarring. No acute airspace disease. Normal heart size. No evidence of bowel obstruction or free air. Stable air throughout small bowel centrally, unchanged from earlier today. Small to moderate volume of stool in the colon. No acute osseous abnormalities are seen. IMPRESSION: 1. No bowel obstruction or free air. Unchanged bowel-gas pattern from earlier today. 2. Unchanged elevation of left hemidiaphragm with left lung base atelectasis/scarring. Electronically Signed   By: Narda Rutherford M.D.   On: 09/30/2023 22:37   DG Abd Portable 1V  Result Date: 09/30/2023 CLINICAL DATA:  Abdominal pain EXAM: PORTABLE ABDOMEN - 1 VIEW COMPARISON:  CT 09/25/23. FINDINGS: Gas seen in nondilated loops of small and large bowel. Scattered colonic stool. Air in the rectum. No obstruction. There is some motion on this portable supine radiograph. The diaphragm is clipped off the edge of the film. Overlapping cardiac leads. IMPRESSION: Nonspecific bowel gas pattern. Please correlate with the prior CT findings of pancreatitis Electronically Signed   By: Karen Kays M.D.   On: 09/30/2023 14:13   CT ABDOMEN PELVIS W CONTRAST  Result Date: 09/25/2023 CLINICAL DATA:  Epigastric pain EXAM: CT ABDOMEN AND PELVIS WITH CONTRAST TECHNIQUE: Multidetector CT imaging of the abdomen and pelvis was performed using the standard protocol following bolus administration of intravenous contrast. RADIATION DOSE REDUCTION: This exam was performed according to the  departmental dose-optimization program which includes automated exposure control, adjustment of the mA and/or kV according to patient size and/or use of iterative reconstruction technique. CONTRAST:  OMNIPAQUE IOHEXOL 300 MG/ML  SOLN COMPARISON:  09/16/2023 FINDINGS: Lower chest: Elevation of the left hemidiaphragm, stable. No acute findings. Hepatobiliary: Diffuse low-density throughout the liver compatible with fatty infiltration. No focal abnormality. Gallbladder unremarkable. Common bile duct prominent at 8 mm, similar to prior study. Pancreas: Truncation of the pancreatic tail. Enlargement of the pancreatic head with surrounding peripancreatic edema. Findings are similar to prior study. Spleen: No focal abnormality.  Normal size. Adrenals/Urinary Tract: No adrenal abnormality. No focal renal abnormality. No stones or hydronephrosis. Urinary bladder is unremarkable. Stomach/Bowel: Normal appendix. Stomach, large and small bowel grossly unremarkable. Vascular/Lymphatic: No evidence of aneurysm or adenopathy. Reproductive: No visible focal abnormality. Other: No free fluid or free air. Musculoskeletal: No acute bony abnormality IMPRESSION: Continued enlargement of the pancreatic head with surrounding inflammation compatible with acute pancreatitis Hepatic steatosis. Electronically Signed   By: Charlett Nose M.D.   On: 09/25/2023 02:34       The total time spent in the appointment was 30 minutes encounter with patients including review of chart and various tests results, discussions about plan of care and coordination of care plan   All questions were answered. The patient knows to call the clinic with any problems, questions or concerns. No barriers to learning was detected.  Thank you for the courtesy of this consultation, Dawson Bills,  NP  12/2/20242:43 PM  Addendum I have seen the patient, examined him. I agree with the assessment and and plan and have edited the notes.   This is a  37 year old male with past traumatic history of alcohol abuse, recurrent pancreatitis, HIV noncompliant with medication, homeless, who was admitted for recurrent pancreatitis.  CT scan reviewed nonocclusive mesentery vein thrombosis, and a pancreatic lesion which requires further workup of abdominal MRI, and determine if biopsy is warranted. This will be better done after his acute pancreatitis resolves.  His mesenteric thrombosis is likely related to pancreatitis,or pancreatic lesion if it is malignant.  I will check a CA 19.9.  I recommend anticoagulation with DOAC such as Eliquis, but I am not sure if patient will be compliant with his medication.  We discussed alcohol cessation and rehab, he is willing to go to rehab after detox in the hospital.  I gave him my contact information and asked him to call me for follow-up after discharge.  I will follow-up as needed before discharge.   Malachy Mood MD  10/11/2023

## 2023-10-11 NOTE — Progress Notes (Signed)
[facility] Progress Note Patient: Gary [name] WNU:[id number] DOB: [birth year] DOA: 10/09/2023  DOS: the patient was seen and examined on 10/11/2023  Brief hospital course: PMH of alcohol abuse with recurrent pancreatitis, alcohol abuse seizures, HIV and noncompliance with medical regimen comes to the hospital with numbness of abdominal pain nausea and vomiting.  Lipase level is elevated and currently being treated for pancreatitis. GI consulted due to findings of pseudocyst formation as well as mesenteric thrombus formation and ascites. Hematology was consulted as well for mesenteric thrombus.   Assessment and Plan: Recurrent alcohol induced pancreatitis. Suspect small pseudocyst Presents with abdominal pain.  Continues to drink. Lipase was 300. Reports that his abdominal pain is more severe than his last admission. Was able to be controlled with morphine last admit but currently requesting Dilaudid. CT abdomen was performed.  Shows evidence of mesenteric thrombus, worsening edema, ascites as well as pseudocyst formation. Appreciate GI consultation. Recommend clear liquid diet, IV fluids, judicious use of analgesics.   Alcohol abuse. Appears to be having some withdrawal. Continue Librium.  Continue Ativan as needed.  Continue thiamine.  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 Currently on Lovenox. Platelet counts are trending down. Hematology consulted. Patient has history of very poor compliance with medical regimen.  At present initiating anticoagulation in therapeutic range would place the patient at a high risk for poor outcome and therefore recommendation is to continue conservative care for now.  Appreciate hematology.  Ileus. So far no vomiting. Will monitor for Now on clear liquid diet.  Pancreatic head lesion. Will require MRI once recovers from acute pancreatitis.    HIV. Noncompliant with medication. Has not been taking Biktarvy. Last viral titer was more than 47,000, CD4 38% to 48 absolute in September 2024. Will request ID follow-up outpatient.   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 Resume.   Underweight. Adult failure to thrive. Placing the patient at high risk of poor outcome. Body mass index is 18.24 kg/m.    Subjective: Pain improving.  No nausea no vomiting.  Passing gas but no BM.  Physical Exam: General: in Mild distress, No Rash Cardiovascular: S1 and S2 Present, No Murmur Respiratory: Good respiratory effort, Bilateral Air entry present. No Crackles, No wheezes Abdomen: Bowel Sound present, mild diffuse tenderness Extremities: No edema Neuro: Alert and oriented x3, no new focal deficit  Data Reviewed: I have Reviewed nursing notes, Vitals, and Lab results. Since last encounter, pertinent lab results CBC and CMP   . I have ordered test including CBC and CMP  . I have discussed pt's care plan and test results with GI and hematology  .   Disposition: Status is: Inpatient Remains inpatient appropriate because: Monitor for improvement in abdominal pain and diet tolerance  enoxaparin (LOVENOX) injection 40 mg Start: 10/10/23 1000   Family Communication: No one at bedside Level of care: Med-Surg   Vitals:   10/11/23 1032 10/11/23 1200 10/11/23 1315 10/11/23 1800  BP: 125/70 110/69 107/74 112/74  Pulse: 90 83 81 85  Resp:   16   Temp:   98 F (36.7 C)   TempSrc:   Oral   SpO2:   94%   Weight:      Height:         Author: Lynden Oxford, MD 10/11/2023 6:19 PM  Please look on [email or web address] to find out who is on call.

## 2023-10-11 NOTE — Plan of Care (Signed)
  Problem: Education: Goal: Knowledge of General Education information will improve Description: Including pain rating scale, medication(s)/side effects and non-pharmacologic comfort measures Outcome: Progressing   Problem: Pain Management: Goal: General experience of comfort will improve Outcome: Progressing

## 2023-10-11 NOTE — Hospital Course (Addendum)
Brief hospital course: PMH of alcohol abuse with recurrent pancreatitis, alcohol abuse seizures, HIV and noncompliance with medical regimen comes to the hospital with numbness of abdominal pain nausea and vomiting.  Lipase level is elevated and currently being treated for pancreatitis. GI consulted due to findings of pseudocyst formation as well as mesenteric thrombus formation and ascites. Hematology was consulted as well for mesenteric thrombus.   Assessment and Plan: Recurrent alcohol induced pancreatitis. Suspect small pseudocyst Presents with abdominal pain.  Continues to drink. Lipase was 300. Reports that his abdominal pain is more severe than his last admission. Was able to be controlled with morphine last admit but currently requesting Dilaudid. CT abdomen was performed.  Shows evidence of mesenteric thrombus, worsening edema, ascites as well as pseudocyst formation. Appreciate GI consultation. Recommend clear liquid diet, IV fluids, judicious use of analgesics.   Alcohol abuse. Appears to be having some withdrawal. Continue Librium.  Continue Ativan as needed.  Continue thiamine.  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 Currently on Lovenox. Platelet counts are trending down. Hematology consulted. Patient has history of very poor compliance with medical regimen.  At present initiating anticoagulation in therapeutic range would place the patient at a high risk for poor outcome and therefore recommendation is to continue conservative care for now.  Appreciate hematology.  Ileus. So far no vomiting. Will monitor for Now on clear liquid diet.  Pancreatic head lesion. Will require MRI once recovers from acute pancreatitis.   HIV. Noncompliant with medication. Has not been taking Biktarvy. Last viral titer was more than 47,000, CD4 38% to 48 absolute in September 2024. Will request ID follow-up  outpatient.   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 Resume.   Underweight. Adult failure to thrive. Placing the patient at high risk of poor outcome. Body mass index is 18.24 kg/m.   Hyponatremia. Will recheck tomorrow.

## 2023-10-11 NOTE — Consult Note (Signed)
[facility] Consultation Note  Referring Provider: [facility] Primary Care Physician:  Patient, No Pcp Per Primary Gastroenterologist:  None  Reason for Consultation:  pancreatitis  HPI: Gary [name] is a 37 y.o. male HIV and alcohol abuse and schizophrenia.  Admitted abdominal pain and pancreatitis.  Drinks heavily, several bottles per day of wine/liquor.  Has had prior recurrent pancreatitis.  He is a bit tremulous and somnolent today and can't answer questions well.  No further history obtainable.   Past Medical History:  Diagnosis Date   Alcohol abuse    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   Anxiety    Bipolar 2 disorder (HCC)    HIV (human immunodeficiency virus infection) (HCC)    Pancreatitis    PTSD (post-traumatic stress disorder)    Schizophrenia (HCC)    Seizures (HCC)    Subdural hematoma (HCC)     Past Surgical History:  Procedure Laterality Date   BIOPSY  04/19/2022   Procedure: BIOPSY;  Surgeon: Lemar Lofty., MD;  Location: [facility] ENDOSCOPY;  Service: Gastroenterology;;   ENTEROSCOPY N/A 04/19/2022   Procedure: ENTEROSCOPY;  Surgeon: Lemar Lofty., MD;  Location: [facility] ENDOSCOPY;  Service: Gastroenterology;  Laterality: N/A;   INCISION AND DRAINAGE PERIRECTAL ABSCESS N/A 09/24/2016   Procedure: IRRIGATION AND DEBRIDEMENT PERIRECTAL ABSCESS;  Surgeon: Ricarda Frame, MD;  Location: [facility] ORS;  Service: General;  Laterality: N/A;   none      Prior to Admission medications   Medication Sig Start Date End Date Taking? Authorizing Provider  levETIRAcetam (KEPPRA) 500 MG tablet Take 1 tablet (500 mg total) by mouth 2 (two) times daily. Patient not taking: Reported on 10/10/2023 10/03/23   Rhetta Mura, MD  pantoprazole (PROTONIX) 40 MG tablet Take 1 tablet (40 mg total) by mouth daily. Patient not taking: Reported on 10/10/2023 10/03/23   Rhetta Mura, MD  famotidine (PEPCID) 20 MG tablet Take 1 tablet (20 mg  total) by mouth 2 (two) times daily. Patient not taking: Reported on 06/01/2019 05/14/19 06/02/19  Benjiman Core, MD    Current Facility-Administered Medications  Medication Dose Route Frequency Provider Last Rate Last Admin   acetaminophen (TYLENOL) tablet 650 mg  650 mg Oral Q6H PRN Briscoe Deutscher, MD   650 mg at 10/10/23 1709   Or   acetaminophen (TYLENOL) suppository 650 mg  650 mg Rectal Q6H PRN Opyd, Lavone Neri, MD       chlordiazePOXIDE (LIBRIUM) capsule 25 mg  25 mg Oral QID Rolly Salter, MD   25 mg at 10/11/23 1205   Followed by   chlordiazePOXIDE (LIBRIUM) capsule 25 mg  25 mg Oral TID Rolly Salter, MD       Followed by   Melene Muller ON 10/12/2023] chlordiazePOXIDE (LIBRIUM) capsule 25 mg  25 mg Oral BH-qamhs Rolly Salter, MD       Followed by   Melene Muller ON 10/13/2023] chlordiazePOXIDE (LIBRIUM) capsule 25 mg  25 mg Oral Daily Rolly Salter, MD       enoxaparin (LOVENOX) injection 40 mg  40 mg Subcutaneous Q24H Opyd, Lavone Neri, MD   40 mg at 10/11/23 0813   folic acid (FOLVITE) tablet 1 mg  1 mg Oral Daily Opyd, Lavone Neri, MD   1 mg at 10/11/23 0813   HYDROmorphone (DILAUDID) injection 0.5 mg  0.5 mg Intravenous Q3H PRN Luiz Iron, NP   0.5 mg at 10/11/23 1250   hydrOXYzine (ATARAX) tablet 25 mg  25 mg Oral TID Rolly Salter, MD  25 mg at 10/11/23 1020   lactated ringers infusion   Intravenous Continuous Rolly Salter, MD 150 mL/hr at 10/11/23 1021 New Bag at 10/11/23 1021   levETIRAcetam (KEPPRA) tablet 500 mg  500 mg Oral BID Briscoe Deutscher, MD   500 mg at 10/11/23 1610   loperamide (IMODIUM) capsule 2-4 mg  2-4 mg Oral PRN Rolly Salter, MD       LORazepam (ATIVAN) tablet 1-4 mg  1-4 mg Oral Q1H PRN Briscoe Deutscher, MD   2 mg at 10/11/23 9604   Or   LORazepam (ATIVAN) injection 1-4 mg  1-4 mg Intravenous Q1H PRN Opyd, Lavone Neri, MD       multivitamin with minerals tablet 1 tablet  1 tablet Oral Daily Opyd, Lavone Neri, MD   1 tablet at 10/11/23 0812   ondansetron  (ZOFRAN) tablet 4 mg  4 mg Oral Q6H PRN Briscoe Deutscher, MD   4 mg at 10/10/23 1709   Or   ondansetron (ZOFRAN) injection 4 mg  4 mg Intravenous Q6H PRN Opyd, Lavone Neri, MD       oxyCODONE (Oxy IR/ROXICODONE) immediate release tablet 10 mg  10 mg Oral Q4H PRN Rolly Salter, MD   10 mg at 10/11/23 0704   pantoprazole (PROTONIX) EC tablet 40 mg  40 mg Oral Daily Opyd, Lavone Neri, MD   40 mg at 10/11/23 5409   senna-docusate (Senokot-S) tablet 1 tablet  1 tablet Oral BID Rolly Salter, MD   1 tablet at 10/11/23 8119   thiamine (VITAMIN B1) tablet 100 mg  100 mg Oral Daily Opyd, Lavone Neri, MD   100 mg at 10/11/23 1478   Or   thiamine (VITAMIN B1) injection 100 mg  100 mg Intravenous Daily Opyd, Lavone Neri, MD        Allergies as of 10/09/2023 - Review Complete 10/09/2023  Allergen Reaction Noted   Tegretol [carbamazepine] Other (See Comments) 06/07/2020   Caffeine Palpitations 06/15/2018    Family History  Problem Relation Age of Onset   Alcohol abuse Mother    Alcohol abuse Father    Colon cancer Other    Other Other    Cancer Other     Social History   Socioeconomic History   Marital status: Single    Spouse name: Not on file   Number of children: Not on file   Years of education: Not on file   Highest education level: Not on file  Occupational History   Occupation: unemployed  Tobacco Use   Smoking status: Every Day    Current packs/day: 0.50    Types: Cigarettes   Smokeless tobacco: Never   Tobacco comments:    unable to smoke while incarcerated 6+ months 02/13/20  Vaping Use   Vaping status: Never Used  Substance and Sexual Activity   Alcohol use: Yes    Alcohol/week: 105.0 standard drinks of alcohol    Types: 105 Cans of beer per week    Comment: drinks every day, all day, "whatever I can get my hands on"   Drug use: Yes    Types: Methamphetamines    Comment: last used 04/10/2019   Sexual activity: Yes    Partners: Male, Male    Comment: declined condoms   03/2021  Other Topics Concern   Not on file  Social History Narrative   "Currently living on the streets"   Independent at baseline   Occasionally goes to the [facility]   Social  Determinants of Health   Financial Resource Strain: Patient Declined (10/11/2023)   Overall Financial Resource Strain (CARDIA)    Difficulty of Paying Living Expenses: Patient declined  Food Insecurity: Patient Declined (10/11/2023)   Hunger Vital Sign    Worried About Running Out of Food in the Last Year: Patient declined    Ran Out of Food in the Last Year: Patient declined  Recent Concern: Food Insecurity - Food Insecurity Present (10/10/2023)   Hunger Vital Sign    Worried About Programme researcher, broadcasting/film/video in the Last Year: Sometimes true    Ran Out of Food in the Last Year: Sometimes true  Transportation Needs: Unmet Transportation Needs (10/11/2023)   PRAPARE - Administrator, Civil Service (Medical): Yes    Lack of Transportation (Non-Medical): Yes  Physical Activity: Not on file  Stress: No Stress Concern Present (06/30/2022)   Received from [facility], [facility] of Occupational Health - Occupational Stress Questionnaire    Feeling of Stress : Not at all  Recent Concern: Stress - Stress Concern Present (06/25/2022)   Received from [facility] of Occupational Health - Occupational Stress Questionnaire    Feeling of Stress : Rather much  Social Connections: Unknown (06/24/2022)   Received from [facility]   Social Network    Social Network: Not on file  Intimate Partner Violence: Not At Risk (10/10/2023)   Humiliation, Afraid, Rape, and Kick questionnaire    Fear of Current or Ex-Partner: No    Emotionally Abused: No    Physically Abused: No    Sexually Abused: No    Review of Systems: As per HPI, all others negative.  Physical Exam: Vital signs in last 24 hours: Temp:  [98 F (36.7 C)-98.8 F (37.1 C)] 98 F (36.7 C) (12/02  1315) Pulse Rate:  [80-90] 81 (12/02 1315) Resp:  [16-20] 16 (12/02 1315) BP: (106-137)/(66-99) 107/74 (12/02 1315) SpO2:  [94 %-100 %] 94 % (12/02 1315) Last BM Date : 10/09/23 General:   Thin, cachectic, much older-appearing than stated age, tremulous Head:  Normocephalic and atraumatic. Eyes:  Sclera clear, no icterus.   Conjunctiva pink. Ears:  Normal auditory acuity. Nose:  No deformity, discharge,  or lesions. Mouth:  No deformity or lesions.  Oropharynx pale and dry Neck:  Supple; no masses or thyromegaly. Lungs:  No respiratory distress Abdomen:  Mild distended, diffuse tenderness with voluntary guarding Msk:  Symmetrical without gross deformities. Normal posture. Pulses:  Normal pulses noted. Extremities:  Without clubbing or edema. Neurologic:  Awake, tremulous, doesn't answer questions readily, confused Skin:  Several excoriations Cervical Nodes:  No significant cervical adenopathy. Psych:  Awake but not readily answering questions   Lab Results: Recent Labs    10/09/23 2044 10/10/23 0041 10/11/23 0418  WBC 12.8* 12.0* 4.3  HGB 15.0 13.8 11.4*  HCT 45.1 43.5 34.3*  PLT 292 187 132*   BMET Recent Labs    10/09/23 2044 10/10/23 0041 10/11/23 0418  NA 138 138 130*  K 3.3* 3.8 3.8  CL 103 106 96*  CO2 22 21* 25  GLUCOSE 83 72 69*  BUN 5* 5* <5*  CREATININE 0.56* 0.47* 0.60*  CALCIUM 8.5* 8.0* 8.5*   LFT Recent Labs    10/11/23 0418  PROT 5.9*  ALBUMIN 3.0*  AST 23  ALT 17  ALKPHOS 62  BILITOT 1.2*   PT/INR No results for input(s): "LABPROT", "INR" in the last 72  hours.  Studies/Results: CT ABDOMEN PELVIS W CONTRAST  Result Date: 10/10/2023 CLINICAL DATA:  Pancreatitis suspected EXAM: CT ABDOMEN AND PELVIS WITH CONTRAST TECHNIQUE: Multidetector CT imaging of the abdomen and pelvis was performed using the standard protocol following bolus administration of intravenous contrast. RADIATION DOSE REDUCTION: This exam was performed according to the  departmental dose-optimization program which includes automated exposure control, adjustment of the mA and/or kV according to patient size and/or use of iterative reconstruction technique. CONTRAST:  OMNIPAQUE IOHEXOL 300 MG/ML  SOLN COMPARISON:  CT abdomen and pelvis 09/25/2023 FINDINGS: Lower chest: No acute abnormality. Hepatobiliary: There is diffuse fatty infiltration of the liver. No focal liver lesions are seen. The gallbladder is distended. No gallstones are identified. There is no biliary ductal dilatation. Pancreas: There is fat stranding and fluid surrounding the pancreas, predominantly of the head of the pancreas, which has increased from prior. There is ill-defined hypodensity in the pancreatic head measuring 1.9 by 0.6 cm similar to prior. No pancreatic ductal dilatation. Spleen: Mildly enlarged, unchanged. Adrenals/Urinary Tract: Adrenal glands are unremarkable. Kidneys are normal, without renal calculi, focal lesion, or hydronephrosis. Bladder is unremarkable. Stomach/Bowel: There are focal areas of wall thickening inflammation of the proximal transverse colon and hepatic flexure. Appendix is within normal limits. There are dilated small bowel loops in the left abdomen measuring up to 4.8 cm. No definitive transition point visualized, but there are decompressed distal small bowel loops. Stomach is distended with contrast and air. There is wall thickening of the duodenal as it approximates the pancreatic head. Vascular/Lymphatic: Aorta and IVC are normal in size. 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 The portal vein appears small in size, a new finding. Compression or thrombus not excluded at this level. Right and left portal veins appear patent. Splenic vein is patent. 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 No enlarged lymph nodes are seen. Reproductive: Prostate gland is enlarged. Other: There is a new small amount of ascites throughout the abdomen and pelvis.  Musculoskeletal: No acute or significant osseous findings. IMPRESSION: 1. Findings compatible with acute pancreatitis. Fluid and inflammation has increased compared to prior. 2. There is ill-defined hypodensity in the pancreatic head which may represent a small pseudocyst, but underlying mass is not excluded. Recommend follow-up MRI after resolution of acute symptoms. 3. New small amount of ascites throughout the abdomen and pelvis. 4. Dilated small bowel loops in the left abdomen measuring up to 4.8 cm. No definitive transition point visualized, but there are decompressed distal small bowel loops. Findings may represent ileus or partial small bowel obstruction. 5. Questionable nonocclusive superior mesenteric vein thrombus. 6. The portal vein appears small in size, a new finding. Compression or thrombus not excluded at this level. 7. Focal areas of wall thickening and inflammation of the proximal transverse colon and hepatic flexure. Findings may be reactive, but colitis is not excluded. 8. Fatty infiltration of the liver. 9. Splenomegaly. Electronically Signed   By: Darliss Cheney M.D.   On: 10/10/2023 19:48    Impression:   Pancreatitis, highly likely alcohol-induced. Distended small bowel, suspect ileus. 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 Hypodensity pancreatic head, small, suspect pseudocyst (lesion otherwise not excluded). Alcohol abuse with current symptoms most consistent alcohol withdrawal. Hepatic steatosis.  Plan:   Supportive care:  IVF, judicious analgesics. Sips clear liquids ok. Hematology consult for mesenteric thromboses; I would be highly reluctant to anticoagulate this patient for many reasons (poor compliance, active pancreatitis which could become hemorrhagic, ongoing alcohol abuse). Treat alcohol withdrawal. [facility]  [facility] will follow along.   LOS: 2 days   Marykatherine Sherwood M  10/11/2023, 1:43 PM  Cell [phone] If no answer or after 5 PM call [phone]

## 2023-10-11 NOTE — TOC CM/SW Note (Addendum)
Transition of Care [facility]) - Inpatient Brief Assessment   Patient Details  Name: MARICE VACLAVIK MRN: [id number] Date of Birth: [birth year]  Transition [facility]) CM/SW Contact:    Darleene Cleaver, LCSW Phone Number: 10/11/2023, 12:27 PM   Clinical Narrative:  Patient was just discharged last week on the 21st then readmitted on the 24th, then discharged again.  Patient is chronically homeless and noncompliant.    Patient has been offered resources several times to address his SDOH needs.  Patient declines each admission and does not want a PCP to be set up.  CSW will add resources for substance abuse, homelessness, medication assistance, and transportation to AVS.  Patient had DSS information added to his AVS as well.  Patient has history of being noncompliant and not following through with appointments or following up with resources provided.  TOC to continue to follow if patient needs anything else.  Transition of Care Asessment: Insurance and Status: Insurance coverage has been reviewed Patient has primary care physician: No Home environment has been reviewed: Patient is homeless Prior level of function:: indep Prior/Current Home Services: No current home services Social Determinants of Health Reivew: SDOH reviewed interventions complete Readmission risk has been reviewed: Yes Transition of care needs: transition of care needs identified, TOC will continue to follow

## 2023-10-12 ENCOUNTER — Other Ambulatory Visit (HOSPITAL_COMMUNITY): Payer: Self-pay

## 2023-10-12 DIAGNOSIS — K859 Acute pancreatitis without necrosis or infection, unspecified: Secondary | ICD-10-CM | POA: Diagnosis not present

## 2023-10-12 LAB — CBC
HCT: 31.7 % — ABNORMAL LOW (ref 39.0–52.0)
Hemoglobin: 10.4 g/dL — ABNORMAL LOW (ref 13.0–17.0)
MCH: 32.5 pg (ref 26.0–34.0)
MCHC: 32.8 g/dL (ref 30.0–36.0)
MCV: 99.1 fL (ref 80.0–100.0)
Platelets: 112 10*3/uL — ABNORMAL LOW (ref 150–400)
RBC: 3.2 MIL/uL — ABNORMAL LOW (ref 4.22–5.81)
RDW: 13.9 % (ref 11.5–15.5)
WBC: 3.2 10*3/uL — ABNORMAL LOW (ref 4.0–10.5)
nRBC: 0 % (ref 0.0–0.2)

## 2023-10-12 LAB — COMPREHENSIVE METABOLIC PANEL
ALT: 15 U/L (ref 0–44)
AST: 18 U/L (ref 15–41)
Albumin: 2.7 g/dL — ABNORMAL LOW (ref 3.5–5.0)
Alkaline Phosphatase: 54 U/L (ref 38–126)
Anion gap: 6 (ref 5–15)
BUN: <5 mg/dL — ABNORMAL LOW (ref 6–20)
CO2: 26 mmol/L (ref 22–32)
Calcium: 8.1 mg/dL — ABNORMAL LOW (ref 8.9–10.3)
Chloride: 99 mmol/L (ref 98–111)
Creatinine, Ser: 0.59 mg/dL — ABNORMAL LOW (ref 0.61–1.24)
GFR, Estimated: >60 mL/min (ref >60)
Glucose, Bld: 74 mg/dL (ref 70–99)
Potassium: 3.4 mmol/L — ABNORMAL LOW (ref 3.5–5.1)
Sodium: 131 mmol/L — ABNORMAL LOW (ref 135–145)
Total Bilirubin: 1.1 mg/dL (ref <1.2)
Total Protein: 5.5 g/dL — ABNORMAL LOW (ref 6.5–8.1)

## 2023-10-12 LAB — TECHNOLOGIST SMEAR REVIEW

## 2023-10-12 LAB — DIFFERENTIAL
Abs Immature Granulocytes: 0.01 10*3/uL (ref 0.00–0.07)
Basophils Absolute: 0 10*3/uL (ref 0.0–0.1)
Basophils Relative: 0 %
Eosinophils Absolute: 0.2 10*3/uL (ref 0.0–0.5)
Eosinophils Relative: 6 %
Immature Granulocytes: 0 %
Lymphocytes Relative: 28 %
Lymphs Abs: 0.9 10*3/uL (ref 0.7–4.0)
Monocytes Absolute: 0.3 10*3/uL (ref 0.1–1.0)
Monocytes Relative: 8 %
Neutro Abs: 1.9 10*3/uL (ref 1.7–7.7)
Neutrophils Relative %: 58 %

## 2023-10-12 LAB — LIPASE, BLOOD: Lipase: 121 U/L — ABNORMAL HIGH (ref 11–51)

## 2023-10-12 LAB — MAGNESIUM: Magnesium: 1.6 mg/dL — ABNORMAL LOW (ref 1.7–2.4)

## 2023-10-12 MED ORDER — ENOXAPARIN SODIUM 40 MG/0.4ML IJ SOSY
40.0000 mg | PREFILLED_SYRINGE | INTRAMUSCULAR | Status: DC
  Filled 2023-10-12: qty 0.4

## 2023-10-12 MED ORDER — HEPARIN BOLUS VIA INFUSION
3700.0000 [IU] | Freq: Once | INTRAVENOUS | Status: DC
  Filled 2023-10-12: qty 3700

## 2023-10-12 MED ORDER — LACTATED RINGERS IV SOLN: INTRAVENOUS | Status: AC

## 2023-10-12 MED ORDER — OXYCODONE HCL 5 MG PO TABS: 10.0000 mg | ORAL_TABLET | ORAL | Status: DC | PRN

## 2023-10-12 MED ORDER — MAGNESIUM SULFATE 2 GM/50ML IV SOLN
2.0000 g | Freq: Once | INTRAVENOUS | Status: AC
  Administered 2023-10-12: 2 g via INTRAVENOUS
  Filled 2023-10-12: qty 50

## 2023-10-12 MED ORDER — BOOST / RESOURCE BREEZE PO LIQD CUSTOM
1.0000 | Freq: Two times a day (BID) | ORAL | Status: DC
  Administered 2023-10-12 – 2023-10-15 (×3): 1 via ORAL

## 2023-10-12 MED ORDER — HEPARIN (PORCINE) 25000 UT/250ML-% IV SOLN: 1000.0000 [IU]/h | INTRAVENOUS | Status: DC

## 2023-10-12 MED ORDER — OXYCODONE HCL 5 MG PO TABS
15.0000 mg | ORAL_TABLET | ORAL | Status: DC | PRN
  Administered 2023-10-12 – 2023-10-13 (×6): 15 mg via ORAL
  Filled 2023-10-12 (×7): qty 3

## 2023-10-12 MED ORDER — HYDROMORPHONE HCL 1 MG/ML IJ SOLN
0.5000 mg | INTRAMUSCULAR | Status: DC | PRN
  Administered 2023-10-12 – 2023-10-14 (×10): 0.5 mg via INTRAVENOUS
  Filled 2023-10-12 (×10): qty 0.5

## 2023-10-12 NOTE — Progress Notes (Signed)
Patient educated on the importance of RN administering Lovenox, patient continues to refuse medication administration after education given by RN, MD Allena Katz aware of situation.

## 2023-10-12 NOTE — Progress Notes (Addendum)
PHARMACY - ANTICOAGULATION CONSULT NOTE  Pharmacy Consult for heparin Indication: superior mesenteric vein thrombosis  Allergies  Allergen Reactions   Tegretol [Carbamazepine] Other (See Comments)    Caused vertigo for 2 days after taking it   Caffeine Palpitations    Patient Measurements: Height: 6' (182.9 cm) Weight: 61 kg (134 lb 7.7 oz) IBW/kg (Calculated) : 77.6 Heparin Dosing Weight: 61 kg  Vital Signs: Temp: 98.5 F (36.9 C) (12/03 1353) Temp Source: Oral (12/03 0525) BP: 110/75 (12/03 1353) Pulse Rate: 94 (12/03 1353)  Labs: Recent Labs    10/10/23 0041 10/11/23 0418 10/12/23 0440  HGB 13.8 11.4* 10.4*  HCT 43.5 34.3* 31.7*  PLT 187 132* 112*  CREATININE 0.47* 0.60* 0.59*    Estimated Creatinine Clearance: 109.1 mL/min (A) (by C-G formula based on SCr of 0.59 mg/dL (L)).   Medical History: Past Medical History:  Diagnosis Date   Alcohol abuse    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   Anxiety    Bipolar 2 disorder (HCC)    HIV (human immunodeficiency virus infection) (HCC)    Pancreatitis    PTSD (post-traumatic stress disorder)    Schizophrenia (HCC)    Seizures (HCC)    Subdural hematoma (HCC)      Assessment: 37 year old male with recurrent pancreatitis, seizures related to alcohol withdrawal, HIV and non-compliance to medications. CT abdomen/pelvis with "questionable nonocclusive superior mesenteric vein thrombus." Initial decision to continue conservative care, patient was on prophylactic enoxaparin which he refused today. Pharmacy now consulted for therapeutic anticoagulation with heparin.  Of note, patient with thrombocytopenia likely related to alcohol use.  Goal of Therapy:  Heparin level 0.3-0.7 units/ml Monitor platelets by anticoagulation protocol: Yes   Plan:  -Heparin 3700 unit bolus -Start heparin infusion at 1000 units/hr -Check 6 hour heparin level -Daily CBC  ADDENDUM: Heparin consult/orders discontinued by MD and  patient placed back on prophylactic enoxaparin.   Pricilla Riffle, PharmD, BCPS Clinical Pharmacist 10/12/2023 4:23 PM

## 2023-10-12 NOTE — Progress Notes (Signed)
[facility] Progress Note Patient: Gary [name] ZHY:[id number] DOB: 1985-12-23 DOA: 10/09/2023  DOS: the patient was seen and examined on 10/12/2023  Brief hospital course: PMH of alcohol abuse with recurrent pancreatitis, alcohol abuse seizures, HIV and noncompliance with medical regimen comes to the hospital with numbness of abdominal pain nausea and vomiting.  Lipase level is elevated and currently being treated for pancreatitis. GI consulted due to findings of pseudocyst formation as well as mesenteric thrombus formation and ascites. Hematology was consulted as well for mesenteric thrombus.   Assessment and Plan: Recurrent alcohol induced pancreatitis. Suspect small pseudocyst Presents with abdominal pain.  Continues to drink. Lipase was 300. Reports that his abdominal pain is more severe than his last admission. Was able to be controlled with morphine last admit but currently requesting Dilaudid. CT abdomen was performed.  Shows evidence of mesenteric thrombus, worsening edema, ascites as well as pseudocyst formation. Appreciate GI consultation. Recommend clear liquid diet, IV fluids, judicious use of analgesics.   Alcohol abuse. Appears to be having some withdrawal. Continue Librium.  Continue Ativan as needed.  Continue thiamine.  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 Currently on Lovenox. Platelet counts are trending down. Hematology consulted. Patient has history of very poor compliance with medical regimen.  At present initiating anticoagulation in therapeutic range would place the patient at a high risk for poor outcome and therefore recommendation is to continue conservative care for now.  Discussed with hematology who initially recommend anticoagulation but as of my conversation with Dr. Parke Poisson on 12/3 recommend to follow conservative measures and monitor and outpatient follow-up in 1 month  with repeat CT scan to evaluate his mesenteric thrombosis given the small size. Smear reassuring.  For now we will monitor.  Ileus. So far no vomiting. Will monitor Now on clear liquid diet.  Pancreatic head lesion. Will require MRI once recovers from acute pancreatitis.   HIV. Noncompliant with medication. Has not been taking Biktarvy. Last viral titer was more than 47,000, CD4 38% to 48 absolute in September 2024. Will request ID follow-up outpatient.   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 Resume.   Underweight. Adult failure to thrive. Placing the patient at high risk of poor outcome. Body mass index is 18.24 kg/m.   Hyponatremia. Monitor for now.  Subjective: Pain unchanged.  No BM.  Not passing gas.  No vomiting.  Tells me that he is hungry.  Physical Exam: In mild distress.  No rash. S1-S2 present. Bowel sound present.  Reports diffuse tenderness. No edema. Alert awake and oriented x 3.  Data Reviewed: I have Reviewed nursing notes, Vitals, and Lab results. Reviewed CBC and CMP.  Reordered CBC and CMP.  Discussed with GI as well as hematology.  Disposition: Status is: Inpatient Remains inpatient appropriate because: Monitor for improvement in abdominal pain and diet tolerance  enoxaparin (LOVENOX) injection 40 mg Start: 10/13/23 1000   Family Communication: No one at bedside Level of care: Med-Surg   Vitals:   10/11/23 2045 10/11/23 2217 10/12/23 0525 10/12/23 1353  BP: 131/82 106/70 98/68 110/75  Pulse: 88 89 73 94  Resp: 18  16 16   Temp: 98.7 F (37.1 C)  98.4 F (36.9 C) 98.5 F (36.9 C)  TempSrc: Oral  Oral   SpO2: 94% 94% 92% 94%  Weight:      Height:         Author: Lynden Oxford, MD 10/12/2023 8:16 PM  Please look on [email or web address] to find out  who is on call.

## 2023-10-12 NOTE — Progress Notes (Signed)
Subjective: Ongoing abdominal pain.  Objective: Vital signs in last 24 hours: Temp:  [98 F (36.7 C)-98.7 F (37.1 C)] 98.4 F (36.9 C) (12/03 0525) Pulse Rate:  [73-89] 73 (12/03 0525) Resp:  [16-18] 16 (12/03 0525) BP: (98-131)/(68-82) 98/68 (12/03 0525) SpO2:  [92 %-94 %] 92 % (12/03 0525) Weight change:  Last BM Date : 10/11/23  PE: GEN:  Thin, cachectic-appearing, older-appearing than stated age ABD:  Mild generalized tenderness (improved cf. Yesterday)  Lab Results:  CBC    Component Value Date/Time   WBC 3.2 (L) 10/12/2023 0440   RBC 3.20 (L) 10/12/2023 0440   HGB 10.4 (L) 10/12/2023 0440   HGB 11.1 (L) 06/15/2018 1902   HCT 31.7 (L) 10/12/2023 0440   HCT 33.0 (L) 06/15/2018 1902   PLT 112 (L) 10/12/2023 0440   PLT 235 06/15/2018 1902   MCV 99.1 10/12/2023 0440   MCV 94 06/15/2018 1902   MCV 100 11/15/2014 1810   MCH 32.5 10/12/2023 0440   MCHC 32.8 10/12/2023 0440   RDW 13.9 10/12/2023 0440   RDW 13.8 06/15/2018 1902   RDW 13.8 11/15/2014 1810   LYMPHSABS 0.9 10/12/2023 0440   LYMPHSABS 1.0 06/15/2018 1902   MONOABS 0.3 10/12/2023 0440   EOSABS 0.2 10/12/2023 0440   EOSABS 0.0 06/15/2018 1902   BASOSABS 0.0 10/12/2023 0440   BASOSABS 0.0 06/15/2018 1902  CMP     Component Value Date/Time   NA 131 (L) 10/12/2023 0440   NA 138 11/15/2014 1810   K 3.4 (L) 10/12/2023 0440   K 4.1 11/15/2014 1810   CL 99 10/12/2023 0440   CL 100 11/15/2014 1810   CO2 26 10/12/2023 0440   CO2 30 11/15/2014 1810   GLUCOSE 74 10/12/2023 0440   GLUCOSE 115 (H) 11/15/2014 1810   BUN <5 (L) 10/12/2023 0440   BUN 6 (L) 11/15/2014 1810   CREATININE 0.59 (L) 10/12/2023 0440   CREATININE 0.59 (L) 04/08/2023 0329   CALCIUM 8.1 (L) 10/12/2023 0440   CALCIUM 9.4 11/15/2014 1810   PROT 5.5 (L) 10/12/2023 0440   PROT 8.6 (H) 11/15/2014 1810   ALBUMIN 2.7 (L) 10/12/2023 0440   ALBUMIN 4.7 11/15/2014 1810   AST 18 10/12/2023 0440   AST 42 (H) 11/15/2014 1810   ALT 15  10/12/2023 0440   ALT 61 11/15/2014 1810   ALKPHOS 54 10/12/2023 0440   ALKPHOS 64 11/15/2014 1810   BILITOT 1.1 10/12/2023 0440   BILITOT 0.4 11/15/2014 1810   EGFR 129 04/08/2023 0329   GFRNONAA >60 10/12/2023 0440   GFRNONAA 107 03/20/2021 1641   Assessment:   Pancreatitis, highly likely alcohol-induced. Distended small bowel, suspect ileus. 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 Hypodensity pancreatic head, small, suspect pseudocyst (lesion otherwise not excluded). Alcohol abuse with current symptoms most consistent alcohol withdrawal. Hepatic steatosis.  Plan:   Clear liquid diet. Supportive care:  IVF, judicious analgesics, replete electrolytes. [facility] will follow.   Freddy Jaksch 10/12/2023, 10:53 AM   Cell [phone] If no answer or after 5 PM call [phone]

## 2023-10-13 DIAGNOSIS — K859 Acute pancreatitis without necrosis or infection, unspecified: Secondary | ICD-10-CM | POA: Diagnosis not present

## 2023-10-13 LAB — LIPASE, BLOOD: Lipase: 104 U/L — ABNORMAL HIGH (ref 11–51)

## 2023-10-13 LAB — COMPREHENSIVE METABOLIC PANEL
ALT: 13 U/L (ref 0–44)
AST: 13 U/L — ABNORMAL LOW (ref 15–41)
Albumin: 2.6 g/dL — ABNORMAL LOW (ref 3.5–5.0)
Alkaline Phosphatase: 50 U/L (ref 38–126)
Anion gap: 4 — ABNORMAL LOW (ref 5–15)
BUN: <5 mg/dL — ABNORMAL LOW (ref 6–20)
CO2: 29 mmol/L (ref 22–32)
Calcium: 8.2 mg/dL — ABNORMAL LOW (ref 8.9–10.3)
Chloride: 100 mmol/L (ref 98–111)
Creatinine, Ser: 0.38 mg/dL — ABNORMAL LOW (ref 0.61–1.24)
GFR, Estimated: >60 mL/min (ref >60)
Glucose, Bld: 120 mg/dL — ABNORMAL HIGH (ref 70–99)
Potassium: 3.3 mmol/L — ABNORMAL LOW (ref 3.5–5.1)
Sodium: 133 mmol/L — ABNORMAL LOW (ref 135–145)
Total Bilirubin: 0.5 mg/dL (ref <1.2)
Total Protein: 5.6 g/dL — ABNORMAL LOW (ref 6.5–8.1)

## 2023-10-13 LAB — CBC
HCT: 32.5 % — ABNORMAL LOW (ref 39.0–52.0)
Hemoglobin: 10.6 g/dL — ABNORMAL LOW (ref 13.0–17.0)
MCH: 32.6 pg (ref 26.0–34.0)
MCHC: 32.6 g/dL (ref 30.0–36.0)
MCV: 100 fL (ref 80.0–100.0)
Platelets: 106 10*3/uL — ABNORMAL LOW (ref 150–400)
RBC: 3.25 MIL/uL — ABNORMAL LOW (ref 4.22–5.81)
RDW: 14 % (ref 11.5–15.5)
WBC: 2.8 10*3/uL — ABNORMAL LOW (ref 4.0–10.5)
nRBC: 0 % (ref 0.0–0.2)

## 2023-10-13 LAB — MAGNESIUM: Magnesium: 1.6 mg/dL — ABNORMAL LOW (ref 1.7–2.4)

## 2023-10-13 MED ORDER — MAGNESIUM SULFATE 4 GM/100ML IV SOLN
4.0000 g | Freq: Once | INTRAVENOUS | Status: AC
  Administered 2023-10-13: 4 g via INTRAVENOUS
  Filled 2023-10-13: qty 100

## 2023-10-13 MED ORDER — POTASSIUM CHLORIDE CRYS ER 20 MEQ PO TBCR: 40.0000 meq | EXTENDED_RELEASE_TABLET | ORAL | Status: AC

## 2023-10-13 NOTE — Progress Notes (Signed)
Patient continues to refuse Lovenox after RN has educated patient on importance of receiving medication ordered by MD, MD is aware of situation.

## 2023-10-13 NOTE — Progress Notes (Addendum)
Gary [name]   DOB:[birth year]   MW#:[id number]      ASSESSMENT & PLAN:  Mesenteric thrombosis -Recommended Lovenox 40 mg subcu daily however patient has been refusing. Pt agrees to accept Lovenox today, discussed with nurse.  -Conservative measures at this point patient may be followed as a hematology outpatient   2.  Recurrent acute pancreatitis -alcohol induced -Pain management per medicine -May follow-up with GI as outpatient  3.  Thrombocytopenia and anemia -Likely due to alcohol use -No transfusional support warranted at this time  4.  HIV positive -Follow-up with ID as outpatient  5.  Alcohol and drug use disorder -Continue CIWA protocol  Code Status Full  Discharge planning: Make outpatient appt upon discharge.   Subjective:  Patient seen asleep in bed somnolent lethargic appearing.  However answers questions.  States he refused Lovenox injection because he did not "like" how it was being given.  Agrees to get injection today.  Continues to complain of pain.  Objective:  Vitals:   10/12/23 2037 10/13/23 0506  BP: 110/72 102/77  Pulse: 91 86  Resp: 17 14  Temp: 98.7 F (37.1 C) 98.2 F (36.8 C)  SpO2: 93% 94%     Intake/Output Summary (Last 24 hours) at 10/13/2023 0919 Last data filed at 10/13/2023 4403 Gross per 24 hour  Intake 2919.48 ml  Output 1350 ml  Net 1569.48 ml     REVIEW OF SYSTEMS:   Constitutional: +lethargic/somnolent, Denies fevers, chills or abnormal night sweats Eyes: Denies blurriness of vision, double vision or watery eyes Ears, nose, mouth, throat, and face: Denies mucositis or sore throat Respiratory: Denies cough, dyspnea or wheezes Cardiovascular: Denies palpitation, chest discomfort or lower extremity swelling Gastrointestinal:  Denies nausea, heartburn or change in bowel habits Skin: Denies abnormal skin rashes Lymphatics: Denies new lymphadenopathy or easy bruising Neurological: Denies numbness, tingling or new  weaknesses Behavioral/Psych: Mood is stable, no new changes  All other systems were reviewed with the patient and are negative.  PHYSICAL EXAMINATION: ECOG PERFORMANCE STATUS: 1 - Symptomatic but completely ambulatory  Vitals:   10/12/23 2037 10/13/23 0506  BP: 110/72 102/77  Pulse: 91 86  Resp: 17 14  Temp: 98.7 F (37.1 C) 98.2 F (36.8 C)  SpO2: 93% 94%   Filed Weights   10/09/23 2032 10/10/23 0500  Weight: 150 lb (68 kg) 134 lb 7.7 oz (61 kg)    GENERAL: lethargic, no distress and comfortable SKIN: + pale skin color, texture, turgor are normal, no rashes or significant lesions EYES: normal, conjunctiva are pink and non-injected, sclera clear OROPHARYNX: no exudate, no erythema and lips, buccal mucosa, and tongue normal  NECK: supple, thyroid normal size, non-tender, without nodularity LYMPH: no palpable lymphadenopathy in the cervical, axillary or inguinal LUNGS: clear to auscultation and percussion with normal breathing effort HEART: regular rate & rhythm and no murmurs and no lower extremity edema ABDOMEN: abdomen soft, non-tender and normal bowel sounds MUSCULOSKELETAL: no cyanosis of digits and no clubbing  PSYCH: +lethargic NEURO: no focal motor/sensory deficits   All questions were answered. The patient knows to call the clinic with any problems, questions or concerns.   The total time spent in the appointment was 20 minutes encounter with patient including review of chart and various tests results, discussions about plan of care and coordination of care plan  Dawson Bills, NP 10/13/2023 9:19 AM    Labs Reviewed:  Lab Results  Component Value Date   WBC 2.8 (L) 10/13/2023   HGB  10.6 (L) 10/13/2023   HCT 32.5 (L) 10/13/2023   MCV 100.0 10/13/2023   PLT 106 (L) 10/13/2023   Recent Labs    07/18/23 1101 07/30/23 1917 09/29/23 0302 09/30/23 2107 10/11/23 0418 10/12/23 0440 10/13/23 0508  NA 129*   < >  --    < > 130* 131* 133*  K 3.9   < >  --    <  > 3.8 3.4* 3.3*  CL 92*   < >  --    < > 96* 99 100  CO2 21*   < >  --    < > 25 26 29   GLUCOSE 76   < >  --    < > 69* 74 120*  BUN <5*   < >  --    < > <5* <5* <5*  CREATININE 0.50*   < >  --    < > 0.60* 0.59* 0.38*  CALCIUM 9.0   < >  --    < > 8.5* 8.1* 8.2*  GFRNONAA >60   < >  --    < > >60 >60 >60  PROT 7.6   < > 6.8   < > 5.9* 5.5* 5.6*  ALBUMIN 3.2*   < > 3.2*   < > 3.0* 2.7* 2.6*  AST 45*   < > 16   < > 23 18 13*  ALT 33   < > 25   < > 17 15 13   ALKPHOS 113   < > 85   < > 62 54 50  BILITOT 1.6*   < > 0.5   < > 1.2* 1.1 0.5  BILIDIR 0.3*  --  0.1  --   --   --   --   IBILI 1.3*  --  0.4  --   --   --   --    < > = values in this interval not displayed.    Studies Reviewed:  CT ABDOMEN PELVIS W CONTRAST  Result Date: 10/10/2023 CLINICAL DATA:  Pancreatitis suspected EXAM: CT ABDOMEN AND PELVIS WITH CONTRAST TECHNIQUE: Multidetector CT imaging of the abdomen and pelvis was performed using the standard protocol following bolus administration of intravenous contrast. RADIATION DOSE REDUCTION: This exam was performed according to the departmental dose-optimization program which includes automated exposure control, adjustment of the mA and/or kV according to patient size and/or use of iterative reconstruction technique. CONTRAST:  OMNIPAQUE IOHEXOL 300 MG/ML  SOLN COMPARISON:  CT abdomen and pelvis 09/25/2023 FINDINGS: Lower chest: No acute abnormality. Hepatobiliary: There is diffuse fatty infiltration of the liver. No focal liver lesions are seen. The gallbladder is distended. No gallstones are identified. There is no biliary ductal dilatation. Pancreas: There is fat stranding and fluid surrounding the pancreas, predominantly of the head of the pancreas, which has increased from prior. There is ill-defined hypodensity in the pancreatic head measuring 1.9 by 0.6 cm similar to prior. No pancreatic ductal dilatation. Spleen: Mildly enlarged, unchanged. Adrenals/Urinary Tract: Adrenal  glands are unremarkable. Kidneys are normal, without renal calculi, focal lesion, or hydronephrosis. Bladder is unremarkable. Stomach/Bowel: There are focal areas of wall thickening inflammation of the proximal transverse colon and hepatic flexure. Appendix is within normal limits. There are dilated small bowel loops in the left abdomen measuring up to 4.8 cm. No definitive transition point visualized, but there are decompressed distal small bowel loops. Stomach is distended with contrast and air. There is wall thickening of the duodenal as it approximates  the pancreatic head. Vascular/Lymphatic: Aorta and IVC are normal in size. 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 The portal vein appears small in size, a new finding. Compression or thrombus not excluded at this level. Right and left portal veins appear patent. Splenic vein is patent. 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 No enlarged lymph nodes are seen. Reproductive: Prostate gland is enlarged. Other: There is a new small amount of ascites throughout the abdomen and pelvis. Musculoskeletal: No acute or significant osseous findings. IMPRESSION: 1. Findings compatible with acute pancreatitis. Fluid and inflammation has increased compared to prior. 2. There is ill-defined hypodensity in the pancreatic head which may represent a small pseudocyst, but underlying mass is not excluded. Recommend follow-up MRI after resolution of acute symptoms. 3. New small amount of ascites throughout the abdomen and pelvis. 4. Dilated small bowel loops in the left abdomen measuring up to 4.8 cm. No definitive transition point visualized, but there are decompressed distal small bowel loops. Findings may represent ileus or partial small bowel obstruction. 5. Questionable nonocclusive superior mesenteric vein thrombus. 6. The portal vein appears small in size, a new finding. Compression or thrombus not excluded at this level. 7. Focal areas of wall  thickening and inflammation of the proximal transverse colon and hepatic flexure. Findings may be reactive, but colitis is not excluded. 8. Fatty infiltration of the liver. 9. Splenomegaly. Electronically Signed   By: Darliss Cheney M.D.   On: 10/10/2023 19:48   DG ABD ACUTE 2+V W 1V CHEST  Result Date: 09/30/2023 CLINICAL DATA:  Abdominal pain. EXAM: DG ABDOMEN ACUTE WITH 1 VIEW CHEST COMPARISON:  Radiographs earlier today, CT 09/25/2023 FINDINGS: Unchanged elevation of left hemidiaphragm. Left lung base atelectasis/scarring. No acute airspace disease. Normal heart size. No evidence of bowel obstruction or free air. Stable air throughout small bowel centrally, unchanged from earlier today. Small to moderate volume of stool in the colon. No acute osseous abnormalities are seen. IMPRESSION: 1. No bowel obstruction or free air. Unchanged bowel-gas pattern from earlier today. 2. Unchanged elevation of left hemidiaphragm with left lung base atelectasis/scarring. Electronically Signed   By: Narda Rutherford M.D.   On: 09/30/2023 22:37   DG Abd Portable 1V  Result Date: 09/30/2023 CLINICAL DATA:  Abdominal pain EXAM: PORTABLE ABDOMEN - 1 VIEW COMPARISON:  CT 09/25/23. FINDINGS: Gas seen in nondilated loops of small and large bowel. Scattered colonic stool. Air in the rectum. No obstruction. There is some motion on this portable supine radiograph. The diaphragm is clipped off the edge of the film. Overlapping cardiac leads. IMPRESSION: Nonspecific bowel gas pattern. Please correlate with the prior CT findings of pancreatitis Electronically Signed   By: Karen Kays M.D.   On: 09/30/2023 14:13   CT ABDOMEN PELVIS W CONTRAST  Result Date: 09/25/2023 CLINICAL DATA:  Epigastric pain EXAM: CT ABDOMEN AND PELVIS WITH CONTRAST TECHNIQUE: Multidetector CT imaging of the abdomen and pelvis was performed using the standard protocol following bolus administration of intravenous contrast. RADIATION DOSE REDUCTION: This  exam was performed according to the departmental dose-optimization program which includes automated exposure control, adjustment of the mA and/or kV according to patient size and/or use of iterative reconstruction technique. CONTRAST:  OMNIPAQUE IOHEXOL 300 MG/ML  SOLN COMPARISON:  09/16/2023 FINDINGS: Lower chest: Elevation of the left hemidiaphragm, stable. No acute findings. Hepatobiliary: Diffuse low-density throughout the liver compatible with fatty infiltration. No focal abnormality. Gallbladder unremarkable. Common bile duct prominent at 8 mm, similar to prior study. Pancreas: Truncation of  the pancreatic tail. Enlargement of the pancreatic head with surrounding peripancreatic edema. Findings are similar to prior study. Spleen: No focal abnormality.  Normal size. Adrenals/Urinary Tract: No adrenal abnormality. No focal renal abnormality. No stones or hydronephrosis. Urinary bladder is unremarkable. Stomach/Bowel: Normal appendix. Stomach, large and small bowel grossly unremarkable. Vascular/Lymphatic: No evidence of aneurysm or adenopathy. Reproductive: No visible focal abnormality. Other: No free fluid or free air. Musculoskeletal: No acute bony abnormality IMPRESSION: Continued enlargement of the pancreatic head with surrounding inflammation compatible with acute pancreatitis Hepatic steatosis. Electronically Signed   By: Charlett Nose M.D.   On: 09/25/2023 02:34     Addendum I have seen the patient, examined him. I agree with the assessment and and plan and have edited the notes.   Chart reviewed, pt has been refusing lovenox injections, and the primary team is concerned about drug-seeking of pain medication.  Due to the very high possibility that the patient would not take anticoagulation after discharge, and the small thrombosis, I then would not recommend puting him on anticoagulation.  He will unlike come to office for follow-up, he has a very high possibility to return to hospital with  similar complains after discharge. We will see him as needed.  Malachy Mood  10/13/2023

## 2023-10-13 NOTE — Progress Notes (Signed)
Subjective: Less pain.  Objective: Vital signs in last 24 hours: Temp:  [98.2 F (36.8 C)-98.7 F (37.1 C)] 98.2 F (36.8 C) (12/04 0506) Pulse Rate:  [86-94] 86 (12/04 0506) Resp:  [14-17] 14 (12/04 0506) BP: (102-110)/(72-77) 102/77 (12/04 0506) SpO2:  [93 %-94 %] 94 % (12/04 0506) Weight change:  Last BM Date : 10/11/23  PE: GEN:  Thin, frail, cachectic-appearing, much older-appearing than stated age ABD:  Much less tender, no peritonitis NEURO:  Somewhat tremulous, but can answer questions appropriately.  Lab Results: CBC    Component Value Date/Time   WBC 2.8 (L) 10/13/2023 0508   RBC 3.25 (L) 10/13/2023 0508   HGB 10.6 (L) 10/13/2023 0508   HGB 11.1 (L) 06/15/2018 1902   HCT 32.5 (L) 10/13/2023 0508   HCT 33.0 (L) 06/15/2018 1902   PLT 106 (L) 10/13/2023 0508   PLT 235 06/15/2018 1902   MCV 100.0 10/13/2023 0508   MCV 94 06/15/2018 1902   MCV 100 11/15/2014 1810   MCH 32.6 10/13/2023 0508   MCHC 32.6 10/13/2023 0508   RDW 14.0 10/13/2023 0508   RDW 13.8 06/15/2018 1902   RDW 13.8 11/15/2014 1810   LYMPHSABS 0.9 10/12/2023 0440   LYMPHSABS 1.0 06/15/2018 1902   MONOABS 0.3 10/12/2023 0440   EOSABS 0.2 10/12/2023 0440   EOSABS 0.0 06/15/2018 1902   BASOSABS 0.0 10/12/2023 0440   BASOSABS 0.0 06/15/2018 1902  CMP     Component Value Date/Time   NA 133 (L) 10/13/2023 0508   NA 138 11/15/2014 1810   K 3.3 (L) 10/13/2023 0508   K 4.1 11/15/2014 1810   CL 100 10/13/2023 0508   CL 100 11/15/2014 1810   CO2 29 10/13/2023 0508   CO2 30 11/15/2014 1810   GLUCOSE 120 (H) 10/13/2023 0508   GLUCOSE 115 (H) 11/15/2014 1810   BUN <5 (L) 10/13/2023 0508   BUN 6 (L) 11/15/2014 1810   CREATININE 0.38 (L) 10/13/2023 0508   CREATININE 0.59 (L) 04/08/2023 0329   CALCIUM 8.2 (L) 10/13/2023 0508   CALCIUM 9.4 11/15/2014 1810   PROT 5.6 (L) 10/13/2023 0508   PROT 8.6 (H) 11/15/2014 1810   ALBUMIN 2.6 (L) 10/13/2023 0508   ALBUMIN 4.7 11/15/2014 1810   AST 13 (L)  10/13/2023 0508   AST 42 (H) 11/15/2014 1810   ALT 13 10/13/2023 0508   ALT 61 11/15/2014 1810   ALKPHOS 50 10/13/2023 0508   ALKPHOS 64 11/15/2014 1810   BILITOT 0.5 10/13/2023 0508   BILITOT 0.4 11/15/2014 1810   EGFR 129 04/08/2023 0329   GFRNONAA >60 10/13/2023 0508   GFRNONAA 107 03/20/2021 1641    Assessment:  Pancreatitis, highly likely alcohol-induced. Distended small bowel, suspect ileus.  Seemingly improved. 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 Hypodensity pancreatic head, small, suspect pseudocyst (lesion otherwise not excluded). Alcohol abuse with current symptoms most consistent alcohol withdrawal. Hepatic steatosis.  Plan:   Advancing diet. De-escalate analgesics. Don't see how on earth patient will be compliant with anticoagulation, at least at his present state (alcoholic, homeless), and I can envision risks of anticoagulation to be far worse than benefits, but will defer ultimate decision to hematology team. Main things patient needs at this point are:  Social Worker consult to help arrange outpatient housing; and compelling alcohol rehabilitation center to break this vicious cycle, both of which (homelessness and alcoholism) likely adversely feed one upon the other. [facility] will follow at a distance.  We are happy to see patient in follow-up.  Freddy Jaksch 10/13/2023, 12:19 PM   Cell [phone] If no answer or after 5 PM call [phone]

## 2023-10-13 NOTE — Progress Notes (Signed)
PROGRESS NOTE    Gary [name]  MVH:[id number] DOB: [birth year] DOA: 10/09/2023 PCP: Patient, No Pcp Per   Brief Narrative:  PMH of alcohol abuse with recurrent pancreatitis, alcohol abuse seizures, HIV and noncompliance with medical regimen comes to the hospital with numbness of abdominal pain nausea and vomiting.  Lipase level is elevated and currently being treated for pancreatitis. GI consulted due to findings of pseudocyst formation as well as mesenteric thrombus formation and ascites. Hematology was consulted as well for mesenteric thrombus.  Assessment & Plan:   Principal Problem:   Recurrent acute pancreatitis Active Problems:   Alcohol dependence with withdrawal (HCC)   HIV (human immunodeficiency virus infection) (HCC)   Hypokalemia   Seizure disorder (HCC)  Recurrent alcohol induced pancreatitis. Suspect small pseudocyst Lipase was 300. Reports that his abdominal pain is more severe than his last admission. Was able to be controlled with morphine last admit but currently requesting Dilaudid. CT abdomen was performed.  Shows evidence of mesenteric thrombus, worsening edema, ascites as well as pseudocyst formation. Appreciate GI consultation.  On clear liquid diet but is still complaining of abdominal pain 9 out of 10 and nausea but no vomiting.   Alcohol abuse withdrawal. Last Ativan requirement based on the CIWA was last night.  Does not appear to be withdrawing.  Continue Librium protocol.  Continue to monitor CIWA but no Ativan as needed anymore.   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  Hematology consulted who initially recommend anticoagulation but as of my conversation with Dr. Parke Poisson on 12/3 recommend to follow conservative measures and monitor and outpatient follow-up in 1 month with repeat CT scan to evaluate his mesenteric thrombosis given the small size.  He remains on prophylactic  dose of Lovenox but he is refusing that.  I have counseled him, he is willing to take it.   Ileus. So far no vomiting.  Improved.  Continue clear liquid diet.   Pancreatic head lesion. Will require MRI once recovers from acute pancreatitis.   HIV. Noncompliant with medication. Has not been taking Biktarvy. Last viral titer was more than 47,000, CD4 38% to 48 absolute in September 2024. Will request ID follow-up outpatient.   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 Resume.   Underweight. Adult failure to thrive. Placing the patient at high risk of poor outcome. Body mass index is 18.24 kg/m.    Hyponatremia. Monitor for now.  Hypomagnesemia/hypokalemia: Will replenish both.  DVT prophylaxis: enoxaparin (LOVENOX) injection 40 mg Start: 10/13/23 1000   Code Status: Full Code  Family Communication:  None present at bedside.  Plan of care discussed with patient in length and he/she verbalized understanding and agreed with it.  Status is: Inpatient Remains inpatient appropriate because: Still symptomatic with abdominal pain.   Estimated body mass index is 18.24 kg/m as calculated from the following:   Height as of this encounter: 6' (1.829 m).   Weight as of this encounter: 61 kg.    Nutritional Assessment: Body mass index is 18.24 kg/m.Marland Kitchen Seen by dietician.  I agree with the assessment and plan as outlined below: Nutrition Status:        . Skin Assessment: I have examined the patient's skin and I agree with the wound assessment as performed by the wound care RN as outlined below:    Consultants:  GI and hematology  Procedures:  As above  Antimicrobials:  Anti-infectives (From admission, onward)    None  Subjective: Patient seen and examined.  Complains of abdominal pain 9 out of 10 and intermittent nausea but no vomiting.  No other complaint.  Objective: Vitals:   10/12/23 0525 10/12/23 1353 10/12/23 2037 10/13/23 0506  BP: 98/68 110/75  110/72 102/77  Pulse: 73 94 91 86  Resp: 16 16 17 14   Temp: 98.4 F (36.9 C) 98.5 F (36.9 C) 98.7 F (37.1 C) 98.2 F (36.8 C)  TempSrc: Oral  Oral Oral  SpO2: 92% 94% 93% 94%  Weight:      Height:        Intake/Output Summary (Last 24 hours) at 10/13/2023 0852 Last data filed at 10/13/2023 6387 Gross per 24 hour  Intake 2919.48 ml  Output 1350 ml  Net 1569.48 ml   Filed Weights   10/09/23 2032 10/10/23 0500  Weight: 68 kg 61 kg    Examination:  General exam: Appears calm and comfortable  Respiratory system: Clear to auscultation. Respiratory effort normal. Cardiovascular system: S1 & S2 heard, RRR. No JVD, murmurs, rubs, gallops or clicks. No pedal edema. Gastrointestinal system: Abdomen is nondistended, soft and generalized abdominal tenderness, more pronounced at epigastric and right upper quadrant area. No organomegaly or masses felt. Normal bowel sounds heard. Central nervous system: Alert and oriented. No focal neurological deficits. Extremities: Symmetric 5 x 5 power. Skin: No rashes, lesions or ulcers  Data Reviewed: I have personally reviewed following labs and imaging studies  CBC: Recent Labs  Lab 10/09/23 2044 10/10/23 0041 10/11/23 0418 10/12/23 0440 10/13/23 0508  WBC 12.8* 12.0* 4.3 3.2* 2.8*  NEUTROABS  --   --   --  1.9  --   HGB 15.0 13.8 11.4* 10.4* 10.6*  HCT 45.1 43.5 34.3* 31.7* 32.5*  MCV 97.6 101.2* 100.3* 99.1 100.0  PLT 292 187 132* 112* 106*   Basic Metabolic Panel: Recent Labs  Lab 10/09/23 2044 10/10/23 0041 10/11/23 0418 10/12/23 0440 10/13/23 0508  NA 138 138 130* 131* 133*  K 3.3* 3.8 3.8 3.4* 3.3*  CL 103 106 96* 99 100  CO2 22 21* 25 26 29   GLUCOSE 83 72 69* 74 120*  BUN 5* 5* <5* <5* <5*  CREATININE 0.56* 0.47* 0.60* 0.59* 0.38*  CALCIUM 8.5* 8.0* 8.5* 8.1* 8.2*  MG  --  2.1 1.7 1.6* 1.6*  PHOS  --  4.4  --   --   --    GFR: Estimated Creatinine Clearance: 109.1 mL/min (A) (by C-G formula based on SCr of 0.38  mg/dL (L)). Liver Function Tests: Recent Labs  Lab 10/09/23 2044 10/11/23 0418 10/12/23 0440 10/13/23 0508  AST 31 23 18  13*  ALT 23 17 15 13   ALKPHOS 73 62 54 50  BILITOT 0.6 1.2* 1.1 0.5  PROT 7.5 5.9* 5.5* 5.6*  ALBUMIN 3.8 3.0* 2.7* 2.6*   Recent Labs  Lab 10/09/23 2044 10/10/23 0041 10/11/23 0418 10/12/23 0440 10/13/23 0508  LIPASE 379* 279* 321* 121* 104*   No results for input(s): "AMMONIA" in the last 168 hours. Coagulation Profile: No results for input(s): "INR", "PROTIME" in the last 168 hours. Cardiac Enzymes: No results for input(s): "CKTOTAL", "CKMB", "CKMBINDEX", "TROPONINI" in the last 168 hours. BNP (last 3 results) No results for input(s): "PROBNP" in the last 8760 hours. HbA1C: No results for input(s): "HGBA1C" in the last 72 hours. CBG: No results for input(s): "GLUCAP" in the last 168 hours. Lipid Profile: No results for input(s): "CHOL", "HDL", "LDLCALC", "TRIG", "CHOLHDL", "LDLDIRECT" in the last 72 hours. Thyroid Function  Tests: No results for input(s): "TSH", "T4TOTAL", "FREET4", "T3FREE", "THYROIDAB" in the last 72 hours. Anemia Panel: No results for input(s): "VITAMINB12", "FOLATE", "FERRITIN", "TIBC", "IRON", "RETICCTPCT" in the last 72 hours. Sepsis Labs: No results for input(s): "PROCALCITON", "LATICACIDVEN" in the last 168 hours.  No results found for this or any previous visit (from the past 240 hour(s)).   Radiology Studies: No results found.  Scheduled Meds:  chlordiazePOXIDE  25 mg Oral Daily   enoxaparin (LOVENOX) injection  40 mg Subcutaneous Q24H   feeding supplement  1 Container Oral BID BM   folic acid  1 mg Oral Daily   hydrOXYzine  25 mg Oral TID   levETIRAcetam  500 mg Oral BID   multivitamin with minerals  1 tablet Oral Daily   pantoprazole  40 mg Oral Daily   potassium chloride  40 mEq Oral Q4H   senna-docusate  1 tablet Oral BID   thiamine  100 mg Oral Daily   Or   thiamine  100 mg Intravenous Daily    Continuous Infusions:  lactated ringers 150 mL/hr at 10/13/23 0804   magnesium sulfate bolus IVPB       LOS: 4 days   Hughie Closs, MD [facility]  10/13/2023, 8:52 AM   *Please note that this is a verbal dictation therefore any spelling or grammatical errors are due to the "Dragon Medical One" system interpretation.  Please page via Amion and do not message via secure chat for urgent patient care matters. Secure chat can be used for non urgent patient care matters.  How to contact the [facility] Attending or Consulting provider 7A - 7P or covering provider during after hours 7P -7A, for this patient?  Check the care team in [facility] and look for a) attending/consulting TRH provider listed and b) the [facility] team listed. Page or secure chat 7A-7P. Log into [email or web address] and use [facility]'s universal password to access. If you do not have the password, please contact the hospital operator. Locate the [facility] provider you are looking for under [facility] and page to a number that you can be directly reached. If you still have difficulty reaching the provider, please page the [facility] (Director on Call) for the Hospitalists listed on amion for assistance.

## 2023-10-14 DIAGNOSIS — K859 Acute pancreatitis without necrosis or infection, unspecified: Secondary | ICD-10-CM | POA: Diagnosis not present

## 2023-10-14 LAB — CBC WITH DIFFERENTIAL/PLATELET
Abs Immature Granulocytes: 0 10*3/uL (ref 0.00–0.07)
Basophils Absolute: 0 10*3/uL (ref 0.0–0.1)
Basophils Relative: 0 %
Eosinophils Absolute: 0.1 10*3/uL (ref 0.0–0.5)
Eosinophils Relative: 5 %
HCT: 33.7 % — ABNORMAL LOW (ref 39.0–52.0)
Hemoglobin: 11.5 g/dL — ABNORMAL LOW (ref 13.0–17.0)
Immature Granulocytes: 0 %
Lymphocytes Relative: 25 %
Lymphs Abs: 0.7 10*3/uL (ref 0.7–4.0)
MCH: 33.2 pg (ref 26.0–34.0)
MCHC: 34.1 g/dL (ref 30.0–36.0)
MCV: 97.4 fL (ref 80.0–100.0)
Monocytes Absolute: 0.3 10*3/uL (ref 0.1–1.0)
Monocytes Relative: 12 %
Neutro Abs: 1.6 10*3/uL — ABNORMAL LOW (ref 1.7–7.7)
Neutrophils Relative %: 58 %
Platelets: 111 10*3/uL — ABNORMAL LOW (ref 150–400)
RBC: 3.46 MIL/uL — ABNORMAL LOW (ref 4.22–5.81)
RDW: 14.1 % (ref 11.5–15.5)
WBC: 2.8 10*3/uL — ABNORMAL LOW (ref 4.0–10.5)
nRBC: 0 % (ref 0.0–0.2)

## 2023-10-14 LAB — COMPREHENSIVE METABOLIC PANEL
ALT: 12 U/L (ref 0–44)
AST: 15 U/L (ref 15–41)
Albumin: 2.8 g/dL — ABNORMAL LOW (ref 3.5–5.0)
Alkaline Phosphatase: 58 U/L (ref 38–126)
Anion gap: 5 (ref 5–15)
BUN: <5 mg/dL — ABNORMAL LOW (ref 6–20)
CO2: 29 mmol/L (ref 22–32)
Calcium: 8.6 mg/dL — ABNORMAL LOW (ref 8.9–10.3)
Chloride: 102 mmol/L (ref 98–111)
Creatinine, Ser: 0.52 mg/dL — ABNORMAL LOW (ref 0.61–1.24)
GFR, Estimated: >60 mL/min (ref >60)
Glucose, Bld: 127 mg/dL — ABNORMAL HIGH (ref 70–99)
Potassium: 4 mmol/L (ref 3.5–5.1)
Sodium: 136 mmol/L (ref 135–145)
Total Bilirubin: 0.5 mg/dL (ref <1.2)
Total Protein: 5.8 g/dL — ABNORMAL LOW (ref 6.5–8.1)

## 2023-10-14 LAB — MAGNESIUM: Magnesium: 1.8 mg/dL (ref 1.7–2.4)

## 2023-10-14 LAB — LIPASE, BLOOD: Lipase: 114 U/L — ABNORMAL HIGH (ref 11–51)

## 2023-10-14 MED ORDER — OXYCODONE HCL 5 MG PO TABS
10.0000 mg | ORAL_TABLET | ORAL | Status: DC | PRN
  Administered 2023-10-14 – 2023-10-15 (×5): 10 mg via ORAL
  Filled 2023-10-14 (×5): qty 2

## 2023-10-14 MED ORDER — HYDROMORPHONE HCL 1 MG/ML IJ SOLN
0.5000 mg | INTRAMUSCULAR | Status: DC | PRN
  Administered 2023-10-14: 0.5 mg via INTRAVENOUS
  Filled 2023-10-14: qty 0.5

## 2023-10-14 NOTE — Progress Notes (Signed)
PROGRESS NOTE    Gary [name]  ZOX:[id number] DOB: 11-Jun-1986 DOA: 10/09/2023 PCP: Patient, No Pcp Per   Brief Narrative:  PMH of alcohol abuse with recurrent pancreatitis, alcohol abuse seizures, HIV and noncompliance with medical regimen comes to the hospital with numbness of abdominal pain nausea and vomiting.  Lipase level is elevated and currently being treated for pancreatitis. GI consulted due to findings of pseudocyst formation as well as mesenteric thrombus formation and ascites. Hematology was consulted as well for mesenteric thrombus.  Assessment & Plan:   Principal Problem:   Recurrent acute pancreatitis Active Problems:   Alcohol dependence with withdrawal (HCC)   HIV (human immunodeficiency virus infection) (HCC)   Hypokalemia   Seizure disorder (HCC)  Recurrent alcohol induced pancreatitis. Suspect small pseudocyst Lipase was 300. Reports that his abdominal pain is more severe than his last admission. Was able to be controlled with morphine last admit but this time requesting Dilaudid. CT abdomen was performed.  Shows evidence of mesenteric thrombus, worsening edema, ascites as well as pseudocyst formation. Appreciate GI consultation.  Has been advance to soft diet. During my evaluation today, patient appeared extremely comfortable but complained of 10 out of 10 pain.  When I told him that I am planning to taper his IV as well as oral opioid medications in preparation for discharge in next 24 to 48 hours.  He said " while then discharged me home today if I am not going to get the pain medications, I will go back and drink" .  I asked him if he has any place to live.  He said he is going to live with his friend.  He said he has confirmed with him as well.  Then he said that he cannot afford the pain medications.  Unfortunately, I had to confront him and had to ask him where he would get the money to buy the alcohol from.  Then he responds, " people give it to me".   After that, patient looked very comfortable, he started walking with the mobility team.  Today's actions are clearly indicated of of drug-seeking behavior.  Based on this, I am going to discontinue IV Dilaudid as well as oxycodone 15 mg every 4 hours.  I will only keep him on oxycodone 10 mg every 4 hours as needed.   Alcohol abuse withdrawal. Last Ativan requirement based on the CIWA was last night.  Does not appear to be withdrawing.  Completed Librium protocol.  Continue to monitor CIWA but no Ativan as needed anymore.   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  Hematology consulted who initially recommend anticoagulation but as of my conversation with Dr. Parke Poisson on 12/3 recommend to follow conservative measures and monitor and outpatient follow-up in 1 month with repeat CT scan to evaluate his mesenteric thrombosis given the small size.  He remains on prophylactic dose of Lovenox but he is refusing tha   Ileus. Resolved.   Pancreatic head lesion. Will require MRI once recovers from acute pancreatitis.   HIV. Noncompliant with medication. Has not been taking Biktarvy. Last viral titer was more than 47,000, CD4 38% to 48 absolute in September 2024. Will request ID follow-up outpatient.   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  Underweight. Adult failure to thrive. Placing the patient at high risk of poor outcome. Body mass index is 18.24 kg/m.    Hyponatremia. Monitor for now.  Hypomagnesemia/hypokalemia: Resolved.  DVT prophylaxis: enoxaparin (LOVENOX) injection 40 mg Start:  10/13/23 1000   Code Status: Full Code  Family Communication:  None present at bedside.  Plan of care discussed with patient in length and he/she verbalized understanding and agreed with it.  Status is: Inpatient Remains inpatient appropriate because: Tapering opioid pain medications, will be ready for discharge in 1 to 2  days.   Estimated body mass index is 18.24 kg/m as calculated from the following:   Height as of this encounter: 6' (1.829 m).   Weight as of this encounter: 61 kg.    Nutritional Assessment: Body mass index is 18.24 kg/m.Marland Kitchen Seen by dietician.  I agree with the assessment and plan as outlined below: Nutrition Status:        . Skin Assessment: I have examined the patient's skin and I agree with the wound assessment as performed by the wound care RN as outlined below:    Consultants:  GI and hematology  Procedures:  As above  Antimicrobials:  Anti-infectives (From admission, onward)    None         Subjective: Patient seen and examined.  Please see details above.  Objective: Vitals:   10/13/23 2056 10/14/23 0444 10/14/23 0925 10/14/23 1312  BP: 116/82 109/79 102/64 96/66  Pulse: 97 94 95 78  Resp: 14 15 15 17   Temp: (!) 100.7 F (38.2 C) 99 F (37.2 C) 98.4 F (36.9 C) 98.8 F (37.1 C)  TempSrc: Oral Oral Oral Oral  SpO2: (!) 89% 93% 95% 90%  Weight:      Height:        Intake/Output Summary (Last 24 hours) at 10/14/2023 1337 Last data filed at 10/14/2023 0924 Gross per 24 hour  Intake 1643.43 ml  Output 2450 ml  Net -806.57 ml   Filed Weights   10/09/23 2032 10/10/23 0500  Weight: 68 kg 61 kg    Examination:  General exam: Appears calm and comfortable  Respiratory system: Clear to auscultation. Respiratory effort normal. Cardiovascular system: S1 & S2 heard, RRR. No JVD, murmurs, rubs, gallops or clicks. No pedal edema. Gastrointestinal system: Abdomen is nondistended, soft and nontender. No organomegaly or masses felt. Normal bowel sounds heard. Central nervous system: Alert and oriented. No focal neurological deficits. Extremities: Symmetric 5 x 5 power. Skin: No rashes, lesions or ulcers.   Data Reviewed: I have personally reviewed following labs and imaging studies  CBC: Recent Labs  Lab 10/10/23 0041 10/11/23 0418 10/12/23 0440  10/13/23 0508 10/14/23 0515  WBC 12.0* 4.3 3.2* 2.8* 2.8*  NEUTROABS  --   --  1.9  --  1.6*  HGB 13.8 11.4* 10.4* 10.6* 11.5*  HCT 43.5 34.3* 31.7* 32.5* 33.7*  MCV 101.2* 100.3* 99.1 100.0 97.4  PLT 187 132* 112* 106* 111*   Basic Metabolic Panel: Recent Labs  Lab 10/10/23 0041 10/11/23 0418 10/12/23 0440 10/13/23 0508 10/14/23 0515  NA 138 130* 131* 133* 136  K 3.8 3.8 3.4* 3.3* 4.0  CL 106 96* 99 100 102  CO2 21* 25 26 29 29   GLUCOSE 72 69* 74 120* 127*  BUN 5* <5* <5* <5* <5*  CREATININE 0.47* 0.60* 0.59* 0.38* 0.52*  CALCIUM 8.0* 8.5* 8.1* 8.2* 8.6*  MG 2.1 1.7 1.6* 1.6* 1.8  PHOS 4.4  --   --   --   --    GFR: Estimated Creatinine Clearance: 109.1 mL/min (A) (by C-G formula based on SCr of 0.52 mg/dL (L)). Liver Function Tests: Recent Labs  Lab 10/09/23 2044 10/11/23 0418 10/12/23 0440 10/13/23 1610  10/14/23 0515  AST 31 23 18  13* 15  ALT 23 17 15 13 12   ALKPHOS 73 62 54 50 58  BILITOT 0.6 1.2* 1.1 0.5 0.5  PROT 7.5 5.9* 5.5* 5.6* 5.8*  ALBUMIN 3.8 3.0* 2.7* 2.6* 2.8*   Recent Labs  Lab 10/10/23 0041 10/11/23 0418 10/12/23 0440 10/13/23 0508 10/14/23 0515  LIPASE 279* 321* 121* 104* 114*   No results for input(s): "AMMONIA" in the last 168 hours. Coagulation Profile: No results for input(s): "INR", "PROTIME" in the last 168 hours. Cardiac Enzymes: No results for input(s): "CKTOTAL", "CKMB", "CKMBINDEX", "TROPONINI" in the last 168 hours. BNP (last 3 results) No results for input(s): "PROBNP" in the last 8760 hours. HbA1C: No results for input(s): "HGBA1C" in the last 72 hours. CBG: No results for input(s): "GLUCAP" in the last 168 hours. Lipid Profile: No results for input(s): "CHOL", "HDL", "LDLCALC", "TRIG", "CHOLHDL", "LDLDIRECT" in the last 72 hours. Thyroid Function Tests: No results for input(s): "TSH", "T4TOTAL", "FREET4", "T3FREE", "THYROIDAB" in the last 72 hours. Anemia Panel: No results for input(s): "VITAMINB12", "FOLATE",  "FERRITIN", "TIBC", "IRON", "RETICCTPCT" in the last 72 hours. Sepsis Labs: No results for input(s): "PROCALCITON", "LATICACIDVEN" in the last 168 hours.  No results found for this or any previous visit (from the past 240 hour(s)).   Radiology Studies: No results found.  Scheduled Meds:  enoxaparin (LOVENOX) injection  40 mg Subcutaneous Q24H   feeding supplement  1 Container Oral BID BM   folic acid  1 mg Oral Daily   hydrOXYzine  25 mg Oral TID   levETIRAcetam  500 mg Oral BID   multivitamin with minerals  1 tablet Oral Daily   pantoprazole  40 mg Oral Daily   senna-docusate  1 tablet Oral BID   thiamine  100 mg Oral Daily   Or   thiamine  100 mg Intravenous Daily   Continuous Infusions:     LOS: 5 days   Hughie Closs, MD [facility]  10/14/2023, 1:37 PM   *Please note that this is a verbal dictation therefore any spelling or grammatical errors are due to the "Dragon Medical One" system interpretation.  Please page via Amion and do not message via secure chat for urgent patient care matters. Secure chat can be used for non urgent patient care matters.  How to contact the [facility] Attending or Consulting provider 7A - 7P or covering provider during after hours 7P -7A, for this patient?  Check the care team in [facility] and look for a) attending/consulting TRH provider listed and b) the [facility] team listed. Page or secure chat 7A-7P. Log into [email or web address] and use [place]'s universal password to access. If you do not have the password, please contact the hospital operator. Locate the [facility] provider you are looking for under [facility] and page to a number that you can be directly reached. If you still have difficulty reaching the provider, please page the [facility] (Director on Call) for the Hospitalists listed on amion for assistance.

## 2023-10-14 NOTE — Progress Notes (Addendum)
Mobility Specialist - Progress Note   10/14/23 0852  Mobility  Activity Ambulated independently in hallway  Level of Assistance Standby assist, set-up cues, supervision of patient - no hands on  Assistive Device None  Distance Ambulated (ft) 275 ft  Activity Response Tolerated well  Mobility Referral Yes  $Mobility charge 1 Mobility  Mobility Specialist Start Time (ACUTE ONLY) 0835  Mobility Specialist Stop Time (ACUTE ONLY) 0848  Mobility Specialist Time Calculation (min) (ACUTE ONLY) 13 min   Pt received in bed and agreeable to mobility. No complaints during session. Pt to bed after session with all needs met.    [facility]

## 2023-10-14 NOTE — Plan of Care (Signed)
  Problem: Education: Goal: Knowledge of General Education information will improve Description: Including pain rating scale, medication(s)/side effects and non-pharmacologic comfort measures Outcome: Not Progressing   Problem: Health Behavior/Discharge Planning: Goal: Ability to manage health-related needs will improve Outcome: Not Progressing   

## 2023-10-15 ENCOUNTER — Emergency Department (HOSPITAL_COMMUNITY): Payer: MEDICAID

## 2023-10-15 ENCOUNTER — Inpatient Hospital Stay (HOSPITAL_COMMUNITY)
Admission: EM | Admit: 2023-10-15 | Discharge: 2023-10-20 | DRG: 438 | Disposition: A | Discharge status: Home / Self Care | Payer: MEDICAID | Attending: Internal Medicine | Admitting: Internal Medicine

## 2023-10-15 ENCOUNTER — Encounter (HOSPITAL_COMMUNITY): Payer: Self-pay

## 2023-10-15 DIAGNOSIS — E876 Hypokalemia: Secondary | ICD-10-CM | POA: Diagnosis not present

## 2023-10-15 DIAGNOSIS — K861 Other chronic pancreatitis: Secondary | ICD-10-CM | POA: Diagnosis present

## 2023-10-15 DIAGNOSIS — Z681 Body mass index (BMI) 19 or less, adult: Secondary | ICD-10-CM

## 2023-10-15 DIAGNOSIS — K859 Acute pancreatitis without necrosis or infection, unspecified: Secondary | ICD-10-CM | POA: Diagnosis not present

## 2023-10-15 DIAGNOSIS — F1020 Alcohol dependence, uncomplicated: Secondary | ICD-10-CM | POA: Diagnosis present

## 2023-10-15 DIAGNOSIS — F4310 Post-traumatic stress disorder, unspecified: Secondary | ICD-10-CM | POA: Diagnosis present

## 2023-10-15 DIAGNOSIS — Z888 Allergy status to other drugs, medicaments and biological substances status: Secondary | ICD-10-CM

## 2023-10-15 DIAGNOSIS — Z9102 Food additives allergy status: Secondary | ICD-10-CM

## 2023-10-15 DIAGNOSIS — G8929 Other chronic pain: Secondary | ICD-10-CM | POA: Diagnosis present

## 2023-10-15 DIAGNOSIS — F1910 Other psychoactive substance abuse, uncomplicated: Secondary | ICD-10-CM | POA: Diagnosis present

## 2023-10-15 DIAGNOSIS — F209 Schizophrenia, unspecified: Secondary | ICD-10-CM | POA: Diagnosis present

## 2023-10-15 DIAGNOSIS — E43 Unspecified severe protein-calorie malnutrition: Secondary | ICD-10-CM | POA: Diagnosis present

## 2023-10-15 DIAGNOSIS — F10239 Alcohol dependence with withdrawal, unspecified: Secondary | ICD-10-CM | POA: Diagnosis present

## 2023-10-15 DIAGNOSIS — Z608 Other problems related to social environment: Secondary | ICD-10-CM | POA: Diagnosis present

## 2023-10-15 DIAGNOSIS — K8521 Alcohol induced acute pancreatitis with uninfected necrosis: Principal | ICD-10-CM | POA: Diagnosis present

## 2023-10-15 DIAGNOSIS — Z91199 Patient's noncompliance with other medical treatment and regimen due to unspecified reason: Secondary | ICD-10-CM

## 2023-10-15 DIAGNOSIS — K852 Alcohol induced acute pancreatitis without necrosis or infection: Principal | ICD-10-CM | POA: Diagnosis present

## 2023-10-15 DIAGNOSIS — F17210 Nicotine dependence, cigarettes, uncomplicated: Secondary | ICD-10-CM | POA: Diagnosis present

## 2023-10-15 DIAGNOSIS — T45516A Underdosing of anticoagulants, initial encounter: Secondary | ICD-10-CM | POA: Diagnosis present

## 2023-10-15 DIAGNOSIS — K8681 Exocrine pancreatic insufficiency: Secondary | ICD-10-CM | POA: Diagnosis present

## 2023-10-15 DIAGNOSIS — B20 Human immunodeficiency virus [HIV] disease: Secondary | ICD-10-CM | POA: Diagnosis present

## 2023-10-15 DIAGNOSIS — F3181 Bipolar II disorder: Secondary | ICD-10-CM | POA: Diagnosis present

## 2023-10-15 DIAGNOSIS — Z7901 Long term (current) use of anticoagulants: Secondary | ICD-10-CM

## 2023-10-15 DIAGNOSIS — F419 Anxiety disorder, unspecified: Secondary | ICD-10-CM | POA: Diagnosis present

## 2023-10-15 DIAGNOSIS — Z5900 Homelessness unspecified: Secondary | ICD-10-CM

## 2023-10-15 DIAGNOSIS — Z21 Asymptomatic human immunodeficiency virus [HIV] infection status: Secondary | ICD-10-CM | POA: Diagnosis present

## 2023-10-15 DIAGNOSIS — G40909 Epilepsy, unspecified, not intractable, without status epilepticus: Secondary | ICD-10-CM | POA: Diagnosis present

## 2023-10-15 DIAGNOSIS — Z765 Malingerer [conscious simulation]: Secondary | ICD-10-CM

## 2023-10-15 DIAGNOSIS — T50996A Underdosing of other drugs, medicaments and biological substances, initial encounter: Secondary | ICD-10-CM | POA: Diagnosis present

## 2023-10-15 DIAGNOSIS — Z811 Family history of alcohol abuse and dependence: Secondary | ICD-10-CM

## 2023-10-15 LAB — COMPREHENSIVE METABOLIC PANEL
ALT: 13 U/L (ref 0–44)
AST: 16 U/L (ref 15–41)
Albumin: 3.1 g/dL — ABNORMAL LOW (ref 3.5–5.0)
Alkaline Phosphatase: 63 U/L (ref 38–126)
Anion gap: 12 (ref 5–15)
BUN: <5 mg/dL — ABNORMAL LOW (ref 6–20)
CO2: 25 mmol/L (ref 22–32)
Calcium: 9.1 mg/dL (ref 8.9–10.3)
Chloride: 101 mmol/L (ref 98–111)
Creatinine, Ser: 0.67 mg/dL (ref 0.61–1.24)
GFR, Estimated: >60 mL/min (ref >60)
Glucose, Bld: 135 mg/dL — ABNORMAL HIGH (ref 70–99)
Potassium: 3.7 mmol/L (ref 3.5–5.1)
Sodium: 138 mmol/L (ref 135–145)
Total Bilirubin: 0.4 mg/dL (ref <1.2)
Total Protein: 6.7 g/dL (ref 6.5–8.1)

## 2023-10-15 LAB — CBC
HCT: 33.8 % — ABNORMAL LOW (ref 39.0–52.0)
Hemoglobin: 11.4 g/dL — ABNORMAL LOW (ref 13.0–17.0)
MCH: 32.1 pg (ref 26.0–34.0)
MCHC: 33.7 g/dL (ref 30.0–36.0)
MCV: 95.2 fL (ref 80.0–100.0)
Platelets: 158 10*3/uL (ref 150–400)
RBC: 3.55 MIL/uL — ABNORMAL LOW (ref 4.22–5.81)
RDW: 14.1 % (ref 11.5–15.5)
WBC: 5 10*3/uL (ref 4.0–10.5)
nRBC: 0 % (ref 0.0–0.2)

## 2023-10-15 LAB — LIPASE, BLOOD
Lipase: 53 U/L — ABNORMAL HIGH (ref 11–51)
Lipase: 97 U/L — ABNORMAL HIGH (ref 11–51)

## 2023-10-15 MED ORDER — OXYCODONE HCL 5 MG PO TABS: 5.0000 mg | ORAL_TABLET | Freq: Four times a day (QID) | ORAL | Status: DC | PRN

## 2023-10-15 NOTE — Discharge Instructions (Signed)
Patient given list of [facility]

## 2023-10-15 NOTE — ED Triage Notes (Signed)
Pt has an extensive Hx of ETOH induced pancreatitis, [place] symptoms. Pt has been having nausea and vomiting. He also has a cough present, but is mostly complianing of the abd pain,nasuea, inability to keep food down.    Medic vitals   138/86 102hr 20rr 97% 150bgl

## 2023-10-15 NOTE — ED Provider Triage Note (Signed)
Emergency Medicine Provider Triage Evaluation Note  Gary [name] , a 37 y.o. male  was evaluated in triage.  Pt complains of persistent abdominal pain consistent with his acute on chronic abdominal pain from pancreatitis.  Review of Systems  Positive: Abdominal pain, nausea, vomiting Negative: Chest pain  Physical Exam  BP 108/84   Pulse 91   Temp 98.4 F (36.9 C)   Resp 18   SpO2 98%  Gen:   Awake, appears uncomfortable, nontoxic Resp:  Normal effort  MSK:   Moves extremities without difficulty  Other:  Disheveled  Medical Decision Making  Medically screening exam initiated at 7:25 PM.  Appropriate orders placed.  NICKOLAI ZERBE was informed that the remainder of the evaluation will be completed by another provider, this initial triage assessment does not replace that evaluation, and the importance of remaining in the ED until their evaluation is complete.  37 year old male with past medical history of HIV, alcohol abuse, and schizophrenia presenting to the emergency department today with acute on chronic abdominal pain.  The patient's vital signs here are stable.  Labs are ordered to evaluate for recurrent pancreatitis.  He will need a CT scan as well.  This is ordered.  He is hemodynamically stable at this time.   Durwin Glaze, MD 10/15/23 [phone]

## 2023-10-15 NOTE — Progress Notes (Signed)
AVS reviewed w/ patient who verbalized an understanding. Pt states he needs to do a medical detox for longer before he can go to an inpatient rehab facility. RN stated he should be able to one of the facility's listed since he has been inpatient since 11/30. Pt verbalized an understanding. PIV removed as noted. TOC gave pt resources at discharge. No other questions at this time. Pt given a bus pass - d/c to lobby via w/c.

## 2023-10-15 NOTE — Discharge Summary (Signed)
Physician Discharge Summary  Gary [name] QIO:[id number] DOB: February 12, 1986 DOA: 10/09/2023  PCP: Patient, No Pcp Per  Admit date: 10/09/2023 Discharge date: 10/15/2023 30 Day Unplanned Readmission Risk Score    Flowsheet Row ED to Hosp-Admission (Current) from 10/09/2023 in [facility] General Surgery  30 Day Unplanned Readmission Risk Score (%) 86.68 Filed at 10/15/2023 0801       This score is the patient's risk of an unplanned readmission within 30 days of being discharged (0 -100%). The score is based on dignosis, age, lab data, medications, orders, and past utilization.   Low:  0-14.9   Medium: 15-21.9   High: 22-29.9   Extreme: 30 and above          Admitted From: Home Disposition: Home  Recommendations for Outpatient Follow-up:  Follow up with PCP in 1-2 weeks Please obtain BMP/CBC in one week Please follow up with your PCP on the following pending results: Unresulted Labs (From admission, onward)     Start     Ordered   10/11/23 0500  Lipase, blood  Daily,   R     Question:  Specimen collection method  Answer:  Lab=Lab collect   10/10/23 1417              Home Health: None Equipment/Devices: None  Discharge Condition: Stable CODE STATUS: Full code Diet recommendation: Cardiac  Subjective: Seen and examined.  Yet again complaints of abdominal pain but appears very very comfortable.  Yet again, he says that if he is not going to get his demand feels for increasing opioid medications than he would like to go home.  He is planning to go to his friend's home to live and I did verify with him that his friend is agreeable with that arrangement with him.  Brief/Interim Summary: PMH of alcohol abuse with recurrent pancreatitis, alcohol abuse seizures, HIV and noncompliance with medical regimen presented to ED with abdominal pain and yet again was admitted with recurrent alcohol induced pancreatitis and small pseudocyst.  Lipase was 300.  Patient is specifically  asked for Dilaudid instead of morphine this admission.CT abdomen was performed.  Shows evidence of mesenteric thrombus, worsening edema, ascites as well as pseudocyst formation.  Seen by GI, they recommended conservative management, diet was advanced and he is tolerating soft diet without any reports of nausea or vomiting.  However patient continued to complain of abdominal pain but looked extremely comfortable.  On 10/14/2023, when I told him that I am planning to taper his IV as well as oral opioid medications in preparation for discharge in next 24 to 48 hours.  He said " while then discharge me home today if I am not going to get the pain medications, I will go back and drink" .  I asked him if he has any place to live.  He said he is going to live with his friend.  He said he has confirmed with him as well.  Then he said that he cannot afford the pain medications.  Unfortunately, I had to confront him and had to ask him where he would get the money to buy the alcohol from.  Then he responds, " people give it to me".  After that, patient looked very comfortable, he started walking with the mobility team.  These actions are clearly indicated of of drug-seeking behavior.  Based on this, I discontinued IV Dilaudid as well as oxycodone 15 mg every 4 hours and kept him on 10 mg every 4  hours as needed oxycodone and he has been asking for that every 4 hours on the clock.  Patient's behaviors yet again are indicative of drug-seeking behavior.  I do not see any true indication of keeping him in the hospital anymore.  Patient has tendency to go out on the streets, drink, remain noncompliant with the medications and PCP follow-ups as mentioned in previous TOC notes which I will copy paste below, and tendency to come back to the hospital.  Unfortunately, this is a cycle that he follows and I am afraid that he will continue to do so.  Following is copy paste of comments of TOC documented during this hospitalization on  10/11/2023.  "Patient has been offered resources several times to address his SDOH needs.  Patient declines each admission and does not want a PCP to be set up.  CSW will add resources for substance abuse, homelessness, medication assistance, and transportation to AVS.  Patient had DSS information added to his AVS as well.   Patient has history of being noncompliant and not following through with appointments or following up with resources provided.  TOC to continue to follow if patient needs anything else."   Alcohol abuse withdrawal. Last Ativan requirement based on the CIWA was last night.  Does not appear to be withdrawing.  Completed Librium protocol.  Continue to monitor CIWA but no Ativan as needed anymore.   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  Hematology consulted who initially recommend anticoagulation but as of my conversation with Dr. Parke Poisson on 12/3 recommend to follow conservative measures and monitor and outpatient follow-up in 1 month with repeat CT scan to evaluate his mesenteric thrombosis given the small size.  He was started on prophylactic dose of Lovenox but he continued to refuse that intermittently as well.   Ileus. Resolved.   Pancreatic head lesion. Will require MRI once recovers from acute pancreatitis.   HIV. Noncompliant with medication. Has not been taking Biktarvy. Last viral titer was more than 47,000, CD4 38% to 48 absolute in September 2024.  Follow-up with ID.   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   Underweight. Adult failure to thrive. Placing the patient at high risk of poor outcome. Body mass index is 18.24 kg/m.    Hyponatremia. Stable   Hypomagnesemia/hypokalemia: Resolved.  Discharge plan was discussed with patient and/or family member and they verbalized understanding and agreed with it.  Discharge Diagnoses:  Principal Problem:   Recurrent acute  pancreatitis Active Problems:   Alcohol dependence with withdrawal (HCC)   HIV (human immunodeficiency virus infection) (HCC)   Hypokalemia   Seizure disorder (HCC)    Discharge Instructions   Allergies as of 10/15/2023       Reactions   Tegretol [carbamazepine] Other (See Comments)   Caused vertigo for 2 days after taking it   Caffeine Palpitations        Medication List     TAKE these medications    levETIRAcetam 500 MG tablet Commonly known as: KEPPRA Take 1 tablet (500 mg total) by mouth 2 (two) times daily.   pantoprazole 40 MG tablet Commonly known as: Protonix Take 1 tablet (40 mg total) by mouth daily.        Follow-up Information     PCP Follow up in 1 week(s).                 Allergies  Allergen Reactions   Tegretol [Carbamazepine] Other (See Comments)  Caused vertigo for 2 days after taking it   Caffeine Palpitations    Consultations: GI    Procedures/Studies: CT ABDOMEN PELVIS W CONTRAST  Result Date: 10/10/2023 CLINICAL DATA:  Pancreatitis suspected EXAM: CT ABDOMEN AND PELVIS WITH CONTRAST TECHNIQUE: Multidetector CT imaging of the abdomen and pelvis was performed using the standard protocol following bolus administration of intravenous contrast. RADIATION DOSE REDUCTION: This exam was performed according to the departmental dose-optimization program which includes automated exposure control, adjustment of the mA and/or kV according to patient size and/or use of iterative reconstruction technique. CONTRAST:  OMNIPAQUE IOHEXOL 300 MG/ML  SOLN COMPARISON:  CT abdomen and pelvis 09/25/2023 FINDINGS: Lower chest: No acute abnormality. Hepatobiliary: There is diffuse fatty infiltration of the liver. No focal liver lesions are seen. The gallbladder is distended. No gallstones are identified. There is no biliary ductal dilatation. Pancreas: There is fat stranding and fluid surrounding the pancreas, predominantly of the head of the pancreas,  which has increased from prior. There is ill-defined hypodensity in the pancreatic head measuring 1.9 by 0.6 cm similar to prior. No pancreatic ductal dilatation. Spleen: Mildly enlarged, unchanged. Adrenals/Urinary Tract: Adrenal glands are unremarkable. Kidneys are normal, without renal calculi, focal lesion, or hydronephrosis. Bladder is unremarkable. Stomach/Bowel: There are focal areas of wall thickening inflammation of the proximal transverse colon and hepatic flexure. Appendix is within normal limits. There are dilated small bowel loops in the left abdomen measuring up to 4.8 cm. No definitive transition point visualized, but there are decompressed distal small bowel loops. Stomach is distended with contrast and air. There is wall thickening of the duodenal as it approximates the pancreatic head. Vascular/Lymphatic: Aorta and IVC are normal in size. 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 The portal vein appears small in size, a new finding. Compression or thrombus not excluded at this level. Right and left portal veins appear patent. Splenic vein is patent. 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 No enlarged lymph nodes are seen. Reproductive: Prostate gland is enlarged. Other: There is a new small amount of ascites throughout the abdomen and pelvis. Musculoskeletal: No acute or significant osseous findings. IMPRESSION: 1. Findings compatible with acute pancreatitis. Fluid and inflammation has increased compared to prior. 2. There is ill-defined hypodensity in the pancreatic head which may represent a small pseudocyst, but underlying mass is not excluded. Recommend follow-up MRI after resolution of acute symptoms. 3. New small amount of ascites throughout the abdomen and pelvis. 4. Dilated small bowel loops in the left abdomen measuring up to 4.8 cm. No definitive transition point visualized, but there are decompressed distal small bowel loops. Findings may represent ileus or  partial small bowel obstruction. 5. Questionable nonocclusive superior mesenteric vein thrombus. 6. The portal vein appears small in size, a new finding. Compression or thrombus not excluded at this level. 7. Focal areas of wall thickening and inflammation of the proximal transverse colon and hepatic flexure. Findings may be reactive, but colitis is not excluded. 8. Fatty infiltration of the liver. 9. Splenomegaly. Electronically Signed   By: Darliss Cheney M.D.   On: 10/10/2023 19:48   DG ABD ACUTE 2+V W 1V CHEST  Result Date: 09/30/2023 CLINICAL DATA:  Abdominal pain. EXAM: DG ABDOMEN ACUTE WITH 1 VIEW CHEST COMPARISON:  Radiographs earlier today, CT 09/25/2023 FINDINGS: Unchanged elevation of left hemidiaphragm. Left lung base atelectasis/scarring. No acute airspace disease. Normal heart size. No evidence of bowel obstruction or free air. Stable air throughout small bowel centrally, unchanged from earlier today.  Small to moderate volume of stool in the colon. No acute osseous abnormalities are seen. IMPRESSION: 1. No bowel obstruction or free air. Unchanged bowel-gas pattern from earlier today. 2. Unchanged elevation of left hemidiaphragm with left lung base atelectasis/scarring. Electronically Signed   By: Narda Rutherford M.D.   On: 09/30/2023 22:37   DG Abd Portable 1V  Result Date: 09/30/2023 CLINICAL DATA:  Abdominal pain EXAM: PORTABLE ABDOMEN - 1 VIEW COMPARISON:  CT 09/25/23. FINDINGS: Gas seen in nondilated loops of small and large bowel. Scattered colonic stool. Air in the rectum. No obstruction. There is some motion on this portable supine radiograph. The diaphragm is clipped off the edge of the film. Overlapping cardiac leads. IMPRESSION: Nonspecific bowel gas pattern. Please correlate with the prior CT findings of pancreatitis Electronically Signed   By: Karen Kays M.D.   On: 09/30/2023 14:13   CT ABDOMEN PELVIS W CONTRAST  Result Date: 09/25/2023 CLINICAL DATA:  Epigastric pain EXAM:  CT ABDOMEN AND PELVIS WITH CONTRAST TECHNIQUE: Multidetector CT imaging of the abdomen and pelvis was performed using the standard protocol following bolus administration of intravenous contrast. RADIATION DOSE REDUCTION: This exam was performed according to the departmental dose-optimization program which includes automated exposure control, adjustment of the mA and/or kV according to patient size and/or use of iterative reconstruction technique. CONTRAST:  OMNIPAQUE IOHEXOL 300 MG/ML  SOLN COMPARISON:  09/16/2023 FINDINGS: Lower chest: Elevation of the left hemidiaphragm, stable. No acute findings. Hepatobiliary: Diffuse low-density throughout the liver compatible with fatty infiltration. No focal abnormality. Gallbladder unremarkable. Common bile duct prominent at 8 mm, similar to prior study. Pancreas: Truncation of the pancreatic tail. Enlargement of the pancreatic head with surrounding peripancreatic edema. Findings are similar to prior study. Spleen: No focal abnormality.  Normal size. Adrenals/Urinary Tract: No adrenal abnormality. No focal renal abnormality. No stones or hydronephrosis. Urinary bladder is unremarkable. Stomach/Bowel: Normal appendix. Stomach, large and small bowel grossly unremarkable. Vascular/Lymphatic: No evidence of aneurysm or adenopathy. Reproductive: No visible focal abnormality. Other: No free fluid or free air. Musculoskeletal: No acute bony abnormality IMPRESSION: Continued enlargement of the pancreatic head with surrounding inflammation compatible with acute pancreatitis Hepatic steatosis. Electronically Signed   By: Charlett Nose M.D.   On: 09/25/2023 02:34     Discharge Exam: Vitals:   10/14/23 2058 10/15/23 0552  BP: 110/79 111/74  Pulse: 79 71  Resp: 18 18  Temp: 99.4 F (37.4 C) 98.5 F (36.9 C)  SpO2: 92% 91%   Vitals:   10/14/23 1718 10/14/23 2058 10/15/23 0509 10/15/23 0552  BP: 101/66 110/79  111/74  Pulse: 79 79  71  Resp:  18  18  Temp:  99.4 F  (37.4 C)  98.5 F (36.9 C)  TempSrc:  Oral  Oral  SpO2:  92%  91%  Weight:   62.2 kg   Height:        General: Pt is alert, awake, not in acute distress Cardiovascular: RRR, S1/S2 +, no rubs, no gallops Respiratory: CTA bilaterally, no wheezing, no rhonchi Abdominal: Soft, NT, ND, bowel sounds + Extremities: no edema, no cyanosis    The results of significant diagnostics from this hospitalization (including imaging, microbiology, ancillary and laboratory) are listed below for reference.     Microbiology: No results found for this or any previous visit (from the past 240 hour(s)).   Labs: BNP (last 3 results) No results for input(s): "BNP" in the last 8760 hours. Basic Metabolic Panel: Recent Labs  Lab 10/10/23  0041 10/11/23 0418 10/12/23 0440 10/13/23 0508 10/14/23 0515  NA 138 130* 131* 133* 136  K 3.8 3.8 3.4* 3.3* 4.0  CL 106 96* 99 100 102  CO2 21* 25 26 29 29   GLUCOSE 72 69* 74 120* 127*  BUN 5* <5* <5* <5* <5*  CREATININE 0.47* 0.60* 0.59* 0.38* 0.52*  CALCIUM 8.0* 8.5* 8.1* 8.2* 8.6*  MG 2.1 1.7 1.6* 1.6* 1.8  PHOS 4.4  --   --   --   --    Liver Function Tests: Recent Labs  Lab 10/09/23 2044 10/11/23 0418 10/12/23 0440 10/13/23 0508 10/14/23 0515  AST 31 23 18  13* 15  ALT 23 17 15 13 12   ALKPHOS 73 62 54 50 58  BILITOT 0.6 1.2* 1.1 0.5 0.5  PROT 7.5 5.9* 5.5* 5.6* 5.8*  ALBUMIN 3.8 3.0* 2.7* 2.6* 2.8*   Recent Labs  Lab 10/11/23 0418 10/12/23 0440 10/13/23 0508 10/14/23 0515 10/15/23 0502  LIPASE 321* 121* 104* 114* 53*   No results for input(s): "AMMONIA" in the last 168 hours. CBC: Recent Labs  Lab 10/10/23 0041 10/11/23 0418 10/12/23 0440 10/13/23 0508 10/14/23 0515  WBC 12.0* 4.3 3.2* 2.8* 2.8*  NEUTROABS  --   --  1.9  --  1.6*  HGB 13.8 11.4* 10.4* 10.6* 11.5*  HCT 43.5 34.3* 31.7* 32.5* 33.7*  MCV 101.2* 100.3* 99.1 100.0 97.4  PLT 187 132* 112* 106* 111*   Cardiac Enzymes: No results for input(s): "CKTOTAL", "CKMB",  "CKMBINDEX", "TROPONINI" in the last 168 hours. BNP: Invalid input(s): "POCBNP" CBG: No results for input(s): "GLUCAP" in the last 168 hours. D-Dimer No results for input(s): "DDIMER" in the last 72 hours. Hgb A1c No results for input(s): "HGBA1C" in the last 72 hours. Lipid Profile No results for input(s): "CHOL", "HDL", "LDLCALC", "TRIG", "CHOLHDL", "LDLDIRECT" in the last 72 hours. Thyroid function studies No results for input(s): "TSH", "T4TOTAL", "T3FREE", "THYROIDAB" in the last 72 hours.  Invalid input(s): "FREET3" Anemia work up No results for input(s): "VITAMINB12", "FOLATE", "FERRITIN", "TIBC", "IRON", "RETICCTPCT" in the last 72 hours. Urinalysis    Component Value Date/Time   COLORURINE YELLOW 09/25/2023 0003   APPEARANCEUR CLEAR 09/25/2023 0003   APPEARANCEUR Clear 11/15/2014 1755   LABSPEC 1.027 09/25/2023 0003   LABSPEC 1.002 11/15/2014 1755   PHURINE 6.0 09/25/2023 0003   GLUCOSEU NEGATIVE 09/25/2023 0003   GLUCOSEU Negative 11/15/2014 1755   HGBUR NEGATIVE 09/25/2023 0003   BILIRUBINUR NEGATIVE 09/25/2023 0003   BILIRUBINUR Negative 11/15/2014 1755   KETONESUR NEGATIVE 09/25/2023 0003   PROTEINUR NEGATIVE 09/25/2023 0003   NITRITE NEGATIVE 09/25/2023 0003   LEUKOCYTESUR NEGATIVE 09/25/2023 0003   LEUKOCYTESUR Negative 11/15/2014 1755   Sepsis Labs Recent Labs  Lab 10/11/23 0418 10/12/23 0440 10/13/23 0508 10/14/23 0515  WBC 4.3 3.2* 2.8* 2.8*   Microbiology No results found for this or any previous visit (from the past 240 hour(s)).  FURTHER DISCHARGE INSTRUCTIONS:   Get Medicines reviewed and adjusted: Please take all your medications with you for your next visit with your Primary MD   Laboratory/radiological data: Please request your Primary MD to go over all hospital tests and procedure/radiological results at the follow up, please ask your Primary MD to get all Hospital records sent to his/her office.   In some cases, they will be blood  work, cultures and biopsy results pending at the time of your discharge. Please request that your primary care M.D. goes through all the records of your hospital data and  follows up on these results.   Also Note the following: If you experience worsening of your admission symptoms, develop shortness of breath, life threatening emergency, suicidal or homicidal thoughts you must seek medical attention immediately by calling 911 or calling your MD immediately  if symptoms less severe.   You must read complete instructions/literature along with all the possible adverse reactions/side effects for all the Medicines you take and that have been prescribed to you. Take any new Medicines after you have completely understood and accpet all the possible adverse reactions/side effects.    Do not drive when taking Pain medications or sleeping medications (Benzodaizepines)   Do not take more than prescribed Pain, Sleep and Anxiety Medications. It is not advisable to combine anxiety,sleep and pain medications without talking with your primary care practitioner   Special Instructions: If you have smoked or chewed Tobacco  in the last 2 yrs please stop smoking, stop any regular Alcohol  and or any Recreational drug use.   Wear Seat belts while driving.   Please note: You were cared for by a hospitalist during your hospital stay. Once you are discharged, your primary care physician will handle any further medical issues. Please note that NO REFILLS for any discharge medications will be authorized once you are discharged, as it is imperative that you return to your primary care physician (or establish a relationship with a primary care physician if you do not have one) for your post hospital discharge needs so that they can reassess your need for medications and monitor your lab values  Time coordinating discharge: Over 30 minutes  SIGNED:   Hughie Closs, MD  [facility] 10/15/2023, 9:35 AM *Please note  that this is a verbal dictation therefore any spelling or grammatical errors are due to the "[employer] One" system interpretation. If 7PM-7AM, please contact night-coverage [email or web address]

## 2023-10-15 NOTE — Progress Notes (Signed)
Patent refused discharge paperwork that included resources for homeless shelters, community care clinics AVS, social services, financial aide and transportation guides. Patient was taken to main entrance in wheelchair.

## 2023-10-15 NOTE — TOC Transition Note (Signed)
Transition of Care [facility]) - CM/SW Discharge Note   Patient Details  Name: Gary [name] MRN: [id number] Date of Birth: 05-10-1986  [facility] CM/SW Contact:  Adrian Prows, RN Phone Number: 10/15/2023, 10:13 AM   Clinical Narrative:    Spoke w/ pt in room; he agrees to receive resources for SA counseling/education, [employer]; social services, shelters, financial assistance, and transportation; pt declined having this RN, CM schedule appt for new PCP; resources placed in d/c instructions; copies of resources given to pt; bus pass given to pt by discharge RN; no TOC needs.   Final next level of care: Home/Self Care Barriers to Discharge: No Barriers Identified   Patient Goals and CMS Choice      Discharge Placement                         Discharge Plan and Services Additional resources added to the After Visit Summary for                                       Social Determinants of Health (SDOH) Interventions SDOH Screenings   Food Insecurity: Patient Declined (10/11/2023)  Recent Concern: Food Insecurity - Food Insecurity Present (10/10/2023)  Housing: Patient Declined (10/10/2023)  Recent Concern: Housing - High Risk (10/03/2023)  Transportation Needs: Unmet Transportation Needs (10/11/2023)  Utilities: Not At Risk (10/10/2023)  Alcohol Screen: High Risk (07/25/2021)  Depression (PHQ2-9): Medium Risk (02/12/2021)  Financial Resource Strain: Patient Declined (10/11/2023)  Social Connections: Unknown (06/24/2022)   Received from [facility]  Stress: No Stress Concern Present (06/30/2022)   Received from [facility], [facility]  Recent Concern: Stress - Stress Concern Present (06/25/2022)   Received from [facility]  Tobacco Use: High Risk (10/09/2023)     Readmission Risk Interventions    10/11/2023   12:26 PM 10/03/2023   11:23 AM 09/30/2023    3:35 PM  Readmission Risk Prevention Plan   Transportation Screening Complete Complete Complete  Medication Review (RN Care Manager) Referral to Pharmacy Complete Complete  PCP or Specialist appointment within 3-5 days of discharge Patient refused Patient refused Patient refused  HRI or Home Care Consult Patient refused Complete Complete  SW Recovery Care/Counseling Consult Complete Patient refused Complete  Palliative Care Screening Not Applicable Not Applicable Not Applicable  Skilled Nursing Facility Not Applicable Not Applicable Not Applicable

## 2023-10-16 ENCOUNTER — Emergency Department (HOSPITAL_COMMUNITY): Payer: MEDICAID

## 2023-10-16 ENCOUNTER — Other Ambulatory Visit: Payer: Self-pay

## 2023-10-16 DIAGNOSIS — G40909 Epilepsy, unspecified, not intractable, without status epilepticus: Secondary | ICD-10-CM | POA: Diagnosis present

## 2023-10-16 DIAGNOSIS — Z5900 Homelessness unspecified: Secondary | ICD-10-CM | POA: Diagnosis not present

## 2023-10-16 DIAGNOSIS — K861 Other chronic pancreatitis: Secondary | ICD-10-CM | POA: Diagnosis present

## 2023-10-16 DIAGNOSIS — G8929 Other chronic pain: Secondary | ICD-10-CM | POA: Diagnosis present

## 2023-10-16 DIAGNOSIS — E43 Unspecified severe protein-calorie malnutrition: Secondary | ICD-10-CM | POA: Diagnosis present

## 2023-10-16 DIAGNOSIS — F4310 Post-traumatic stress disorder, unspecified: Secondary | ICD-10-CM | POA: Diagnosis present

## 2023-10-16 DIAGNOSIS — K859 Acute pancreatitis without necrosis or infection, unspecified: Secondary | ICD-10-CM | POA: Diagnosis present

## 2023-10-16 DIAGNOSIS — K8521 Alcohol induced acute pancreatitis with uninfected necrosis: Secondary | ICD-10-CM | POA: Diagnosis not present

## 2023-10-16 DIAGNOSIS — F10239 Alcohol dependence with withdrawal, unspecified: Secondary | ICD-10-CM | POA: Diagnosis present

## 2023-10-16 DIAGNOSIS — F419 Anxiety disorder, unspecified: Secondary | ICD-10-CM | POA: Diagnosis present

## 2023-10-16 DIAGNOSIS — Z91199 Patient's noncompliance with other medical treatment and regimen due to unspecified reason: Secondary | ICD-10-CM | POA: Diagnosis not present

## 2023-10-16 DIAGNOSIS — Z9102 Food additives allergy status: Secondary | ICD-10-CM | POA: Diagnosis not present

## 2023-10-16 DIAGNOSIS — Z811 Family history of alcohol abuse and dependence: Secondary | ICD-10-CM | POA: Diagnosis not present

## 2023-10-16 DIAGNOSIS — Z7901 Long term (current) use of anticoagulants: Secondary | ICD-10-CM | POA: Diagnosis not present

## 2023-10-16 DIAGNOSIS — F17210 Nicotine dependence, cigarettes, uncomplicated: Secondary | ICD-10-CM | POA: Diagnosis present

## 2023-10-16 DIAGNOSIS — K8522 Alcohol induced acute pancreatitis with infected necrosis: Secondary | ICD-10-CM | POA: Diagnosis not present

## 2023-10-16 DIAGNOSIS — Z888 Allergy status to other drugs, medicaments and biological substances status: Secondary | ICD-10-CM | POA: Diagnosis not present

## 2023-10-16 DIAGNOSIS — T50996A Underdosing of other drugs, medicaments and biological substances, initial encounter: Secondary | ICD-10-CM | POA: Diagnosis present

## 2023-10-16 DIAGNOSIS — Z21 Asymptomatic human immunodeficiency virus [HIV] infection status: Secondary | ICD-10-CM | POA: Diagnosis present

## 2023-10-16 DIAGNOSIS — Z681 Body mass index (BMI) 19 or less, adult: Secondary | ICD-10-CM | POA: Diagnosis not present

## 2023-10-16 DIAGNOSIS — E876 Hypokalemia: Secondary | ICD-10-CM | POA: Diagnosis not present

## 2023-10-16 DIAGNOSIS — Z608 Other problems related to social environment: Secondary | ICD-10-CM | POA: Diagnosis present

## 2023-10-16 DIAGNOSIS — K852 Alcohol induced acute pancreatitis without necrosis or infection: Secondary | ICD-10-CM | POA: Diagnosis present

## 2023-10-16 DIAGNOSIS — Z765 Malingerer [conscious simulation]: Secondary | ICD-10-CM | POA: Diagnosis not present

## 2023-10-16 DIAGNOSIS — F3181 Bipolar II disorder: Secondary | ICD-10-CM | POA: Diagnosis present

## 2023-10-16 DIAGNOSIS — K8681 Exocrine pancreatic insufficiency: Secondary | ICD-10-CM | POA: Diagnosis present

## 2023-10-16 DIAGNOSIS — F209 Schizophrenia, unspecified: Secondary | ICD-10-CM | POA: Diagnosis present

## 2023-10-16 LAB — CBC
HCT: 32.8 % — ABNORMAL LOW (ref 39.0–52.0)
Hemoglobin: 11 g/dL — ABNORMAL LOW (ref 13.0–17.0)
MCH: 31.9 pg (ref 26.0–34.0)
MCHC: 33.5 g/dL (ref 30.0–36.0)
MCV: 95.1 fL (ref 80.0–100.0)
Platelets: 161 10*3/uL (ref 150–400)
RBC: 3.45 MIL/uL — ABNORMAL LOW (ref 4.22–5.81)
RDW: 14.5 % (ref 11.5–15.5)
WBC: 3.3 10*3/uL — ABNORMAL LOW (ref 4.0–10.5)
nRBC: 0 % (ref 0.0–0.2)

## 2023-10-16 LAB — BASIC METABOLIC PANEL
Anion gap: 11 (ref 5–15)
BUN: <5 mg/dL — ABNORMAL LOW (ref 6–20)
CO2: 24 mmol/L (ref 22–32)
Calcium: 8.5 mg/dL — ABNORMAL LOW (ref 8.9–10.3)
Chloride: 103 mmol/L (ref 98–111)
Creatinine, Ser: 0.63 mg/dL (ref 0.61–1.24)
GFR, Estimated: >60 mL/min (ref >60)
Glucose, Bld: 85 mg/dL (ref 70–99)
Potassium: 4 mmol/L (ref 3.5–5.1)
Sodium: 138 mmol/L (ref 135–145)

## 2023-10-16 LAB — URINALYSIS, ROUTINE W REFLEX MICROSCOPIC
Bilirubin Urine: NEGATIVE
Glucose, UA: NEGATIVE mg/dL
Hgb urine dipstick: NEGATIVE
Ketones, ur: NEGATIVE mg/dL
Leukocytes,Ua: NEGATIVE
Nitrite: NEGATIVE
Protein, ur: NEGATIVE mg/dL
Specific Gravity, Urine: 1.014 (ref 1.005–1.030)
pH: 5 (ref 5.0–8.0)

## 2023-10-16 LAB — I-STAT CG4 LACTIC ACID, ED
Lactic Acid, Venous: 2.1 mmol/L (ref 0.5–1.9)
Lactic Acid, Venous: 0.5 mmol/L (ref 0.5–1.9)

## 2023-10-16 MED ORDER — FENTANYL CITRATE PF 50 MCG/ML IJ SOSY
100.0000 ug | PREFILLED_SYRINGE | Freq: Once | INTRAMUSCULAR | Status: AC
  Administered 2023-10-16: 50 ug via INTRAVENOUS
  Filled 2023-10-16: qty 2

## 2023-10-16 MED ORDER — LEVETIRACETAM 500 MG PO TABS
500.0000 mg | ORAL_TABLET | Freq: Two times a day (BID) | ORAL | Status: DC
  Administered 2023-10-16 – 2023-10-20 (×9): 500 mg via ORAL
  Filled 2023-10-16 (×9): qty 1

## 2023-10-16 MED ORDER — FENTANYL CITRATE PF 50 MCG/ML IJ SOSY
50.0000 ug | PREFILLED_SYRINGE | Freq: Once | INTRAMUSCULAR | Status: AC
Start: 2023-10-16 — End: 2023-10-16
  Administered 2023-10-16: 50 ug via INTRAVENOUS
  Filled 2023-10-16: qty 1

## 2023-10-16 MED ORDER — ONDANSETRON HCL 4 MG/2ML IJ SOLN
4.0000 mg | Freq: Four times a day (QID) | INTRAMUSCULAR | Status: DC | PRN
  Administered 2023-10-17: 4 mg via INTRAVENOUS
  Filled 2023-10-16: qty 2

## 2023-10-16 MED ORDER — PANCRELIPASE (LIP-PROT-AMYL) 12000-38000 UNITS PO CPEP
12000.0000 [IU] | ORAL_CAPSULE | Freq: Three times a day (TID) | ORAL | Status: DC
  Administered 2023-10-17: 12000 [IU] via ORAL
  Filled 2023-10-16 (×4): qty 1

## 2023-10-16 MED ORDER — THIAMINE HCL 100 MG/ML IJ SOLN
100.0000 mg | Freq: Every day | INTRAMUSCULAR | Status: DC
  Administered 2023-10-16 – 2023-10-17 (×2): 100 mg via INTRAVENOUS
  Filled 2023-10-16 (×2): qty 2

## 2023-10-16 MED ORDER — IOHEXOL 350 MG/ML SOLN
75.0000 mL | Freq: Once | INTRAVENOUS | Status: AC | PRN
  Administered 2023-10-16: 75 mL via INTRAVENOUS

## 2023-10-16 MED ORDER — HYDROMORPHONE HCL 1 MG/ML IJ SOLN
1.0000 mg | INTRAMUSCULAR | Status: DC | PRN
  Administered 2023-10-16 – 2023-10-19 (×20): 1 mg via INTRAVENOUS
  Filled 2023-10-16 (×20): qty 1

## 2023-10-16 MED ORDER — ENOXAPARIN SODIUM 40 MG/0.4ML IJ SOSY
40.0000 mg | PREFILLED_SYRINGE | INTRAMUSCULAR | Status: DC
  Administered 2023-10-16 – 2023-10-18 (×3): 40 mg via SUBCUTANEOUS
  Filled 2023-10-16 (×4): qty 0.4

## 2023-10-16 MED ORDER — PANTOPRAZOLE SODIUM 40 MG IV SOLR
40.0000 mg | INTRAVENOUS | Status: DC
  Administered 2023-10-16 – 2023-10-19 (×4): 40 mg via INTRAVENOUS
  Filled 2023-10-16 (×4): qty 10

## 2023-10-16 MED ORDER — DROPERIDOL 2.5 MG/ML IJ SOLN
1.2500 mg | Freq: Once | INTRAMUSCULAR | Status: AC
  Administered 2023-10-16: 1.25 mg via INTRAVENOUS
  Filled 2023-10-16: qty 2

## 2023-10-16 MED ORDER — SODIUM CHLORIDE 0.9 % IV SOLN
1.0000 g | Freq: Three times a day (TID) | INTRAVENOUS | Status: DC
  Administered 2023-10-16 – 2023-10-19 (×9): 1 g via INTRAVENOUS
  Filled 2023-10-16 (×10): qty 20

## 2023-10-16 MED ORDER — SODIUM CHLORIDE 0.9 % IV SOLN: INTRAVENOUS | Status: DC

## 2023-10-16 NOTE — ED Notes (Signed)
ED TO INPATIENT HANDOFF REPORT  ED Nurse Name and Phone #: Murlean Iba Paramedic [phone]  S Name/Age/Gender Gary [name] 37 y.o. male Room/Bed: 035C/035C  Code Status   Code Status: Prior  Home/SNF/Other Home Patient oriented to: self, place, time, and situation Is this baseline? Yes   Triage Complete: Triage complete  Chief Complaint Pancreatitis [K85.90]  Triage Note Pt has an extensive Hx of ETOH induced pancreatitis, [place] for very similar symptoms. Pt has been having nausea and vomiting. He also has a cough present, but is mostly complianing of the abd pain,nasuea, inability to keep food down.    Medic vitals   138/86 102hr 20rr 97% 150bgl   Allergies Allergies  Allergen Reactions   Tegretol [Carbamazepine] Other (See Comments)    Caused vertigo for 2 days after taking it   Caffeine Palpitations    Level of Care/Admitting Diagnosis ED Disposition     ED Disposition  Admit   Condition  --   Comment  Hospital Area: [facility] [[id number]]  Level of Care: Med-Surg [16]  May admit patient to [name] or [facility] if equivalent level of care is available:: No  Covid Evaluation: Asymptomatic - no recent exposure (last 10 days) testing not required  Diagnosis: Pancreatitis [454098]  Admitting Physician: Zannie Cove [[id number]]  Attending Physician: Zannie Cove [[id number]]  Certification:: I certify this patient will need inpatient services for at least 2 midnights  Expected Medical Readiness: 10/19/2023          B Medical/Surgery History Past Medical History:  Diagnosis Date   Alcohol abuse    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   Anxiety    Bipolar 2 disorder (HCC)    HIV (human immunodeficiency virus infection) (HCC)    Pancreatitis    PTSD (post-traumatic stress disorder)    Schizophrenia (HCC)    Seizures (HCC)    Subdural hematoma (HCC)    Past Surgical History:  Procedure  Laterality Date   BIOPSY  04/19/2022   Procedure: BIOPSY;  Surgeon: Lemar Lofty., MD;  Location: [facility] ENDOSCOPY;  Service: Gastroenterology;;   ENTEROSCOPY N/A 04/19/2022   Procedure: ENTEROSCOPY;  Surgeon: Lemar Lofty., MD;  Location: [facility] ENDOSCOPY;  Service: Gastroenterology;  Laterality: N/A;   INCISION AND DRAINAGE PERIRECTAL ABSCESS N/A 09/24/2016   Procedure: IRRIGATION AND DEBRIDEMENT PERIRECTAL ABSCESS;  Surgeon: Ricarda Frame, MD;  Location: [facility] ORS;  Service: General;  Laterality: N/A;   none       A IV Location/Drains/Wounds Patient Lines/Drains/Airways Status     Active Line/Drains/Airways     Name Placement date Placement time Site Days   Peripheral IV 10/16/23 20 G 2.5" Anterior;Right;Upper Arm 10/16/23  0512  Arm  less than 1            Intake/Output Last 24 hours No intake or output data in the 24 hours ending 10/16/23 1120  Labs/Imaging Results for orders placed or performed during the hospital encounter of 10/15/23 (from the past 48 hour(s))  Lipase, blood     Status: Abnormal   Collection Time: 10/15/23  7:30 PM  Result Value Ref Range   Lipase 97 (H) 11 - 51 U/L    Comment: Performed at [facility], [address] [place], [place] [address]  Comprehensive metabolic panel     Status: Abnormal   Collection Time: 10/15/23  7:30 PM  Result Value Ref Range   Sodium 138 135 - 145 mmol/L  Potassium 3.7 3.5 - 5.1 mmol/L   Chloride 101 98 - 111 mmol/L   CO2 25 22 - 32 mmol/L   Glucose, Bld 135 (H) 70 - 99 mg/dL    Comment: Glucose reference range applies only to samples taken after fasting for at least 8 hours.   BUN <5 (L) 6 - 20 mg/dL   Creatinine, Ser 6.04 0.61 - 1.24 mg/dL   Calcium 9.1 8.9 - 54.0 mg/dL   Total Protein 6.7 6.5 - 8.1 g/dL   Albumin 3.1 (L) 3.5 - 5.0 g/dL   AST 16 15 - 41 U/L   ALT 13 0 - 44 U/L   Alkaline Phosphatase 63 38 - 126 U/L   Total Bilirubin 0.4 <1.2 mg/dL   GFR, Estimated >98 >11 mL/min     Comment: (NOTE) Calculated using the CKD-EPI Creatinine Equation (2021)    Anion gap 12 5 - 15    Comment: Performed at [facility], [address], [place], [place] [address]  CBC     Status: Abnormal   Collection Time: 10/15/23  7:30 PM  Result Value Ref Range   WBC 5.0 4.0 - 10.5 K/uL   RBC 3.55 (L) 4.22 - 5.81 MIL/uL   Hemoglobin 11.4 (L) 13.0 - 17.0 g/dL   HCT 29.5 (L) 62.1 - 30.8 %   MCV 95.2 80.0 - 100.0 fL   MCH 32.1 26.0 - 34.0 pg   MCHC 33.7 30.0 - 36.0 g/dL   RDW 65.7 84.6 - 96.2 %   Platelets 158 150 - 400 K/uL   nRBC 0.0 0.0 - 0.2 %    Comment: Performed at [facility], [address] [place], [place] [address]  I-Stat CG4 Lactic Acid     Status: Abnormal   Collection Time: 10/16/23  2:21 AM  Result Value Ref Range   Lactic Acid, Venous 2.1 (HH) 0.5 - 1.9 mmol/L   Comment NOTIFIED PHYSICIAN   Urinalysis, Routine w reflex microscopic -Urine, Clean Catch     Status: None   Collection Time: 10/16/23  4:05 AM  Result Value Ref Range   Color, Urine YELLOW YELLOW   APPearance CLEAR CLEAR   Specific Gravity, Urine 1.014 1.005 - 1.030   pH 5.0 5.0 - 8.0   Glucose, UA NEGATIVE NEGATIVE mg/dL   Hgb urine dipstick NEGATIVE NEGATIVE   Bilirubin Urine NEGATIVE NEGATIVE   Ketones, ur NEGATIVE NEGATIVE mg/dL   Protein, ur NEGATIVE NEGATIVE mg/dL   Nitrite NEGATIVE NEGATIVE   Leukocytes,Ua NEGATIVE NEGATIVE    Comment: Performed at [facility], 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 EXAM: CTA ABDOMEN AND PELVIS WITHOUT AND WITH CONTRAST TECHNIQUE: Multidetector CT imaging of the abdomen and pelvis was performed using the standard  protocol during bolus administration of intravenous contrast. Multiplanar reconstructed images and MIPs were obtained and reviewed to evaluate the vascular anatomy. RADIATION DOSE REDUCTION: This exam was performed according to the departmental dose-optimization program which includes automated exposure control, adjustment of the mA and/or kV according to patient size and/or use of iterative reconstruction technique. CONTRAST:  75mL OMNIPAQUE IOHEXOL 350 MG/ML SOLN  COMPARISON:  CT Abdomen and Pelvis 10/10/2023 and earlier. FINDINGS: VASCULAR Initial noncontrast images demonstrate minimal left iliac artery calcified plaque and normal caliber of the abdominal aorta. Following contrast the abdominal aorta is patent and normal. The major aortic branches are patent including the celiac and S in the. There is mild ectasia of the left Common iliac artery. Bilateral iliac and proximal femoral arteries otherwise appear patent and normal. On the delayed portal venous phase images there is mass effect and moderate to severe attenuation of the confluence of the splenic vein, SMV, and main portal vein on series 14, image 28. But those structures remain patent. Tiny nonocclusive SMV thrombus redemonstrated on series 14, image 32, but elsewhere the major mesenteric venous structures appear to be patent. Review of the MIP images confirms the above findings. NON-VASCULAR Lower chest: Heart size remains normal. No pericardial effusion. Elevated left hemidiaphragm is chronic. Trace layering pleural effusions are more apparent. Chronic but increased left lower lobe atelectasis. Mild right lung base atelectasis. Hepatobiliary: Liver enhancement is maintained with evidence of hepatic steatosis. Gallbladder appears negative. No significant bile duct enlargement. Pancreas: Severe pancreatitis redemonstrated. Phlegmon like heterogeneous enlargement of the pancreatic head, uncinate, proximal body. Areas of heterogeneous pancreatic necrosis  and progressive cystic spaces within the affected parenchyma individually up to 14-17 mm (series 14, image 31) compatible with developing pseudocysts. Inflammation tracks into the root of the small bowel mesentery. Comparatively small volume of regional free fluid. Secondary inflammation of the stomach and duodenum. Spleen: Negative. Adrenals/Urinary Tract: Negative. Symmetric renal enhancement and contrast excretion. Excreted contrast in the urinary bladder on the 0500 hour repeat images. Stomach/Bowel: Oral contrast mixed with stool in the descending colon. Redundant splenic flexure. Decompressed transverse colon. Normal contrast containing appendix. Indistinct appearance of abdominal small bowel loops, although less dilated since 10/10/2023. Inflammation emitting from the lesser sac. No pneumoperitoneum identified. Lymphatic: No lymphadenopathy identified. Reproductive: Stable. Other: Small volume of free fluid in the pelvis with simple fluid density. Musculoskeletal: No acute osseous abnormality identified. Not present at 0218 hours, but present on repeat images at 0522 hours is patchy contrast and/or enhancement in the left lower lateral chest wall from the lower pectoralis muscle tracking laterally on series 12, image 1. This persists on the delayed portal venous phase images. Underlying ribs appear intact. Furthermore, there is contrast extravasation into the adjacent left upper extremity visible there. These are poorly visible on the scout view. However, on series 21 coronal images the upper extremity extravasation is visible with associated soft tissue edema. IMPRESSION: 1. Ongoing Severe Pancreatitis. Heterogeneous pancreatic phlegmon, necrosis with developing small pseudocysts in the head, uncinate, and proximal body (currently up to 1.7 cm). 2. Regional inflammation, with mass effect on the confluence of the major portal venous structures. The portal venous system does remain patent, although there is  again evidence of a small nonocclusive thrombus there at the SMV. 3. Significant Contrast Extravasation into the Left upper extremity, and also visible at the left lateral chest wall (contrast probably migrated into the chest from a ruptured regional vein such as in near the axilla). Recommend cold compresses, elevation of the left extremity, physical exam checks, and plastic surgery consultation if any skin changes are observed. 4. Small volume of free fluid in the abdomen and pelvis, trace pleural effusions. Lung base atelectasis. Electronically Signed   By: Odessa Fleming M.D.   On: 10/16/2023 06:45    Pending Labs Unresulted Labs (From admission, onward)    None  Vitals/Pain Today's Vitals   10/16/23 0947 10/16/23 0955 10/16/23 0956 10/16/23 1000  BP:    106/71  Pulse:    80  Resp:    14  Temp:   98.4 F (36.9 C)   TempSrc:   Oral   SpO2:    95%  Weight: 137 lb 2 oz (62.2 kg)     Height: 6' (1.829 m)     PainSc:  10-Worst pain ever      Isolation Precautions No active isolations  Medications Medications  0.9 %  sodium chloride infusion ( Intravenous New Bag/Given 10/16/23 0602)  HYDROmorphone (DILAUDID) injection 1 mg (1 mg Intravenous Given 10/16/23 0955)  iohexol (OMNIPAQUE) 350 MG/ML injection 75 mL (75 mLs Intravenous Contrast Given 10/16/23 0530)  droperidol (INAPSINE) 2.5 MG/ML injection 1.25 mg (1.25 mg Intravenous Given 10/16/23 0203)  iohexol (OMNIPAQUE) 350 MG/ML injection 75 mL (75 mLs Intravenous Contrast Given 10/16/23 0242)  fentaNYL (SUBLIMAZE) injection 100 mcg (50 mcg Intravenous Given 10/16/23 0356)  fentaNYL (SUBLIMAZE) injection 50 mcg (50 mcg Intravenous Given 10/16/23 6045)    Mobility walks     Focused Assessments     R Recommendations: See Admitting Provider Note  Report given to:   Additional Notes:

## 2023-10-16 NOTE — ED Notes (Signed)
To ct

## 2023-10-16 NOTE — ED Provider Notes (Addendum)
[place] [facility] AT [facility] Provider Note   CSN: [id number] Arrival date & time: 10/15/23  1910     History  Chief Complaint  Patient presents with   Abdominal Pain    Gary [name] is a 37 y.o. male.  The history is provided by the patient.  Abdominal Pain Pain location:  Generalized Pain radiates to:  Does not radiate Pain severity:  Severe Timing:  Constant Progression:  Unchanged Chronicity:  Recurrent Relieved by:  Nothing Worsened by:  Nothing Ineffective treatments:  None tried Associated symptoms: no fever   Risk factors: no alcohol abuse        Home Medications Prior to Admission medications   Medication Sig Start Date End Date Taking? Authorizing Provider  levETIRAcetam (KEPPRA) 500 MG tablet Take 1 tablet (500 mg total) by mouth 2 (two) times daily. Patient not taking: Reported on 10/10/2023 10/03/23   Rhetta Mura, MD  pantoprazole (PROTONIX) 40 MG tablet Take 1 tablet (40 mg total) by mouth daily. Patient not taking: Reported on 10/10/2023 10/03/23   Rhetta Mura, MD  famotidine (PEPCID) 20 MG tablet Take 1 tablet (20 mg total) by mouth 2 (two) times daily. Patient not taking: Reported on 06/01/2019 05/14/19 06/02/19  Benjiman Core, MD      Allergies    Tegretol [carbamazepine] and Caffeine    Review of Systems   Review of Systems  Constitutional:  Negative for fever.  HENT:  Negative for facial swelling.   Eyes:  Negative for redness.  Respiratory:  Negative for wheezing and stridor.   Gastrointestinal:  Positive for abdominal pain.  All other systems reviewed and are negative.   Physical Exam Updated Vital Signs BP 122/84 (BP Location: Right Arm)   Pulse 91   Temp 98.7 F (37.1 C) (Oral)   Resp (!) 22   SpO2 100%  Physical Exam Vitals and nursing note reviewed.  Constitutional:      General: He is not in acute distress.    Appearance: Normal appearance. He is well-developed. He is not  diaphoretic.  HENT:     Head: Normocephalic and atraumatic.     Nose: Nose normal.  Eyes:     Conjunctiva/sclera: Conjunctivae normal.     Pupils: Pupils are equal, round, and reactive to light.  Cardiovascular:     Rate and Rhythm: Normal rate and regular rhythm.     Pulses: Normal pulses.     Heart sounds: Normal heart sounds.  Pulmonary:     Effort: Pulmonary effort is normal.     Breath sounds: Normal breath sounds. No wheezing or rales.  Abdominal:     General: Bowel sounds are normal.     Palpations: Abdomen is soft. There is no mass.     Tenderness: There is no guarding or rebound.     Hernia: No hernia is present.  Musculoskeletal:        General: Normal range of motion.     Cervical back: Normal range of motion and neck supple.  Skin:    General: Skin is warm and dry.     Capillary Refill: Capillary refill takes less than 2 seconds.  Neurological:     General: No focal deficit present.     Mental Status: He is alert and oriented to person, place, and time.     Deep Tendon Reflexes: Reflexes normal.  Psychiatric:        Mood and Affect: Mood normal.     ED Results /  Procedures / Treatments   Labs (all labs ordered are listed, but only abnormal results are displayed) Results for orders placed or performed during the hospital encounter of 10/15/23  Lipase, blood  Result Value Ref Range   Lipase 97 (H) 11 - 51 U/L  Comprehensive metabolic panel  Result Value Ref Range   Sodium 138 135 - 145 mmol/L   Potassium 3.7 3.5 - 5.1 mmol/L   Chloride 101 98 - 111 mmol/L   CO2 25 22 - 32 mmol/L   Glucose, Bld 135 (H) 70 - 99 mg/dL   BUN <5 (L) 6 - 20 mg/dL   Creatinine, Ser 1.61 0.61 - 1.24 mg/dL   Calcium 9.1 8.9 - 09.6 mg/dL   Total Protein 6.7 6.5 - 8.1 g/dL   Albumin 3.1 (L) 3.5 - 5.0 g/dL   AST 16 15 - 41 U/L   ALT 13 0 - 44 U/L   Alkaline Phosphatase 63 38 - 126 U/L   Total Bilirubin 0.4 <1.2 mg/dL   GFR, Estimated >04 >54 mL/min   Anion gap 12 5 - 15  CBC   Result Value Ref Range   WBC 5.0 4.0 - 10.5 K/uL   RBC 3.55 (L) 4.22 - 5.81 MIL/uL   Hemoglobin 11.4 (L) 13.0 - 17.0 g/dL   HCT 09.8 (L) 11.9 - 14.7 %   MCV 95.2 80.0 - 100.0 fL   MCH 32.1 26.0 - 34.0 pg   MCHC 33.7 30.0 - 36.0 g/dL   RDW 82.9 56.2 - 13.0 %   Platelets 158 150 - 400 K/uL   nRBC 0.0 0.0 - 0.2 %  Urinalysis, Routine w reflex microscopic -Urine, Clean Catch  Result Value Ref Range   Color, Urine YELLOW YELLOW   APPearance CLEAR CLEAR   Specific Gravity, Urine 1.014 1.005 - 1.030   pH 5.0 5.0 - 8.0   Glucose, UA NEGATIVE NEGATIVE mg/dL   Hgb urine dipstick NEGATIVE NEGATIVE   Bilirubin Urine NEGATIVE NEGATIVE   Ketones, ur NEGATIVE NEGATIVE mg/dL   Protein, ur NEGATIVE NEGATIVE mg/dL   Nitrite NEGATIVE NEGATIVE   Leukocytes,Ua NEGATIVE NEGATIVE  I-Stat CG4 Lactic Acid  Result Value Ref Range   Lactic Acid, Venous 2.1 (HH) 0.5 - 1.9 mmol/L   Comment NOTIFIED PHYSICIAN   I-Stat CG4 Lactic Acid  Result Value Ref Range   Lactic Acid, Venous 0.5 0.5 - 1.9 mmol/L   CT ABDOMEN PELVIS W CONTRAST  Result Date: 10/10/2023 CLINICAL DATA:  Pancreatitis suspected EXAM: CT ABDOMEN AND PELVIS WITH CONTRAST TECHNIQUE: Multidetector CT imaging of the abdomen and pelvis was performed using the standard protocol following bolus administration of intravenous contrast. RADIATION DOSE REDUCTION: This exam was performed according to the departmental dose-optimization program which includes automated exposure control, adjustment of the mA and/or kV according to patient size and/or use of iterative reconstruction technique. CONTRAST:  OMNIPAQUE IOHEXOL 300 MG/ML  SOLN COMPARISON:  CT abdomen and pelvis 09/25/2023 FINDINGS: Lower chest: No acute abnormality. Hepatobiliary: There is diffuse fatty infiltration of the liver. No focal liver lesions are seen. The gallbladder is distended. No gallstones are identified. There is no biliary ductal dilatation. Pancreas: There is fat stranding and  fluid surrounding the pancreas, predominantly of the head of the pancreas, which has increased from prior. There is ill-defined hypodensity in the pancreatic head measuring 1.9 by 0.6 cm similar to prior. No pancreatic ductal dilatation. Spleen: Mildly enlarged, unchanged. Adrenals/Urinary Tract: Adrenal glands are unremarkable. Kidneys are normal, without renal calculi, focal  lesion, or hydronephrosis. Bladder is unremarkable. Stomach/Bowel: There are focal areas of wall thickening inflammation of the proximal transverse colon and hepatic flexure. Appendix is within normal limits. There are dilated small bowel loops in the left abdomen measuring up to 4.8 cm. No definitive transition point visualized, but there are decompressed distal small bowel loops. Stomach is distended with contrast and air. There is wall thickening of the duodenal as it approximates the pancreatic head. Vascular/Lymphatic: Aorta and IVC are normal in size. 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 The portal vein appears small in size, a new finding. Compression or thrombus not excluded at this level. Right and left portal veins appear patent. Splenic vein is patent. 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 No enlarged lymph nodes are seen. Reproductive: Prostate gland is enlarged. Other: There is a new small amount of ascites throughout the abdomen and pelvis. Musculoskeletal: No acute or significant osseous findings. IMPRESSION: 1. Findings compatible with acute pancreatitis. Fluid and inflammation has increased compared to prior. 2. There is ill-defined hypodensity in the pancreatic head which may represent a small pseudocyst, but underlying mass is not excluded. Recommend follow-up MRI after resolution of acute symptoms. 3. New small amount of ascites throughout the abdomen and pelvis. 4. Dilated small bowel loops in the left abdomen measuring up to 4.8 cm. No definitive transition point visualized, but there are  decompressed distal small bowel loops. Findings may represent ileus or partial small bowel obstruction. 5. Questionable nonocclusive superior mesenteric vein thrombus. 6. The portal vein appears small in size, a new finding. Compression or thrombus not excluded at this level. 7. Focal areas of wall thickening and inflammation of the proximal transverse colon and hepatic flexure. Findings may be reactive, but colitis is not excluded. 8. Fatty infiltration of the liver. 9. Splenomegaly. Electronically Signed   By: Darliss Cheney M.D.   On: 10/10/2023 19:48   DG ABD ACUTE 2+V W 1V CHEST  Result Date: 09/30/2023 CLINICAL DATA:  Abdominal pain. EXAM: DG ABDOMEN ACUTE WITH 1 VIEW CHEST COMPARISON:  Radiographs earlier today, CT 09/25/2023 FINDINGS: Unchanged elevation of left hemidiaphragm. Left lung base atelectasis/scarring. No acute airspace disease. Normal heart size. No evidence of bowel obstruction or free air. Stable air throughout small bowel centrally, unchanged from earlier today. Small to moderate volume of stool in the colon. No acute osseous abnormalities are seen. IMPRESSION: 1. No bowel obstruction or free air. Unchanged bowel-gas pattern from earlier today. 2. Unchanged elevation of left hemidiaphragm with left lung base atelectasis/scarring. Electronically Signed   By: Narda Rutherford M.D.   On: 09/30/2023 22:37   DG Abd Portable 1V  Result Date: 09/30/2023 CLINICAL DATA:  Abdominal pain EXAM: PORTABLE ABDOMEN - 1 VIEW COMPARISON:  CT 09/25/23. FINDINGS: Gas seen in nondilated loops of small and large bowel. Scattered colonic stool. Air in the rectum. No obstruction. There is some motion on this portable supine radiograph. The diaphragm is clipped off the edge of the film. Overlapping cardiac leads. IMPRESSION: Nonspecific bowel gas pattern. Please correlate with the prior CT findings of pancreatitis Electronically Signed   By: Karen Kays M.D.   On: 09/30/2023 14:13   CT ABDOMEN PELVIS W  CONTRAST  Result Date: 09/25/2023 CLINICAL DATA:  Epigastric pain EXAM: CT ABDOMEN AND PELVIS WITH CONTRAST TECHNIQUE: Multidetector CT imaging of the abdomen and pelvis was performed using the standard protocol following bolus administration of intravenous contrast. RADIATION DOSE REDUCTION: This exam was performed according to the departmental dose-optimization program which  includes automated exposure control, adjustment of the mA and/or kV according to patient size and/or use of iterative reconstruction technique. CONTRAST:  OMNIPAQUE IOHEXOL 300 MG/ML  SOLN COMPARISON:  09/16/2023 FINDINGS: Lower chest: Elevation of the left hemidiaphragm, stable. No acute findings. Hepatobiliary: Diffuse low-density throughout the liver compatible with fatty infiltration. No focal abnormality. Gallbladder unremarkable. Common bile duct prominent at 8 mm, similar to prior study. Pancreas: Truncation of the pancreatic tail. Enlargement of the pancreatic head with surrounding peripancreatic edema. Findings are similar to prior study. Spleen: No focal abnormality.  Normal size. Adrenals/Urinary Tract: No adrenal abnormality. No focal renal abnormality. No stones or hydronephrosis. Urinary bladder is unremarkable. Stomach/Bowel: Normal appendix. Stomach, large and small bowel grossly unremarkable. Vascular/Lymphatic: No evidence of aneurysm or adenopathy. Reproductive: No visible focal abnormality. Other: No free fluid or free air. Musculoskeletal: No acute bony abnormality IMPRESSION: Continued enlargement of the pancreatic head with surrounding inflammation compatible with acute pancreatitis Hepatic steatosis. Electronically Signed   By: Charlett Nose M.D.   On: 09/25/2023 02:34     Radiology No results found.  Procedures Procedures    Medications Ordered in ED Medications  0.9 %  sodium chloride infusion ( Intravenous New Bag/Given 10/16/23 0602)  iohexol (OMNIPAQUE) 350 MG/ML injection 75 mL (75 mLs  Intravenous Contrast Given 10/16/23 0530)  droperidol (INAPSINE) 2.5 MG/ML injection 1.25 mg (1.25 mg Intravenous Given 10/16/23 0203)  iohexol (OMNIPAQUE) 350 MG/ML injection 75 mL (75 mLs Intravenous Contrast Given 10/16/23 0242)  fentaNYL (SUBLIMAZE) injection 100 mcg (50 mcg Intravenous Given 10/16/23 0356)    ED Course/ Medical Decision Making/ A&P                                 Medical Decision Making Patient with acute and chronic pancreatitis who was here for pain   Amount and/or Complexity of Data Reviewed External Data Reviewed: labs, radiology and notes.    Details: Previous admission and imaging and labs reviewed  Labs: ordered.    Details: Normal white count 5, hemoglobin low 11.4, normal platelets. Normal sodium 138, normal potassium 3.7  Radiology: ordered.  Risk Prescription drug management. Decision regarding hospitalization. Risk Details: Reexamined post contrast extravasation.  Swelling in the axilla.  Soft.  Non tender.  No skin changes.  Arm elevated.  Will start cold compresses     Final Clinical Impression(s) / ED Diagnoses Final diagnoses:  Acute pancreatitis, unspecified complication status, unspecified pancreatitis type   The patient appears reasonably stabilized for admission considering the current resources, flow, and capabilities available in the ED at this time, and I doubt any other [facility] requiring further screening and/or treatment in the ED prior to admission.  Rx / DC Orders ED Discharge Orders     None           Jaylee Freeze, MD 10/16/23 [phone]

## 2023-10-16 NOTE — ED Provider Notes (Signed)
648 Dr. Loney Loh re-paged for admission with CT results.  Secure chat also sent.     Danyah Guastella, MD 10/16/23 [phone]

## 2023-10-16 NOTE — ED Notes (Signed)
The pts iv infiltrated in c-t  his study could not be completed

## 2023-10-16 NOTE — Progress Notes (Signed)
Pharmacy Antibiotic Note  Gary [name] is a 37 y.o. male for which pharmacy has been consulted for meropenem dosing for  pancreatitis w/ necrosis .  SCr 0.67 WBC 5; LA 0.5; T 98.4; HR 72; RR 14  Plan: Meropenem 1g q8h Monitor WBC, fever, renal function, cultures De-escalate when able  Height: 6' (182.9 cm) Weight: 62.2 kg (137 lb 2 oz) IBW/kg (Calculated) : 77.6  Temp (24hrs), Avg:98.6 F (37 C), Min:98.4 F (36.9 C), Max:98.7 F (37.1 C)  Recent Labs  Lab 10/11/23 0418 10/12/23 0440 10/13/23 0508 10/14/23 0515 10/15/23 1930 10/16/23 0221 10/16/23 0617  WBC 4.3 3.2* 2.8* 2.8* 5.0  --   --   CREATININE 0.60* 0.59* 0.38* 0.52* 0.67  --   --   LATICACIDVEN  --   --   --   --   --  2.1* 0.5    Estimated Creatinine Clearance: 111.2 mL/min (by C-G formula based on SCr of 0.67 mg/dL).    Allergies  Allergen Reactions   Tegretol [Carbamazepine] Other (See Comments)    Caused vertigo for 2 days after taking it   Caffeine Palpitations   Microbiology results: Pending  Thank you for allowing pharmacy to be a part of this patient's care.  Delmar Landau, PharmD, BCPS 10/16/2023 11:33 AM ED Clinical Pharmacist -  [phone]

## 2023-10-16 NOTE — H&P (Addendum)
History and Physical    Gary [name] ZOX:[id number] DOB: 1986-06-20 DOA: 10/15/2023  Referring MD/NP/PA: EDP PCP:   Chief Complaint: abd pain  HPI: Gary [name] is a 37/M with history of homelessness alcohol abuse, recurrent pancreatitis, has HIV, seizures, noncompliance with discharged from [facility] after conservative management of acute alcoholic pancreatitis and mesenteric thrombus, seen by [facility] as well, discharged on 12/6, there was documented concern for pain seeking behavior as well.  After discharge, reports drinking vodka and coming back to the ER yesterday evening with worsening pain, unable to keep anything down, vital signs stable, labs in the ED were largely unchanged, lipase 97, lactate 2.1.  Repeat CT abdomen pelvis with ongoing severe pancreatitis, necrosis and phlegmon, early pseudocyst.  TRH consulted for readmission  Review of Systems: As per HPI otherwise 14 point review of systems negative.   Past Medical History:  Diagnosis Date   Alcohol abuse    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   Anxiety    Bipolar 2 disorder (HCC)    HIV (human immunodeficiency virus infection) (HCC)    Pancreatitis    PTSD (post-traumatic stress disorder)    Schizophrenia (HCC)    Seizures (HCC)    Subdural hematoma (HCC)     Past Surgical History:  Procedure Laterality Date   BIOPSY  04/19/2022   Procedure: BIOPSY;  Surgeon: Lemar Lofty., MD;  Location: [facility] ENDOSCOPY;  Service: Gastroenterology;;   ENTEROSCOPY N/A 04/19/2022   Procedure: ENTEROSCOPY;  Surgeon: Lemar Lofty., MD;  Location: [facility] ENDOSCOPY;  Service: Gastroenterology;  Laterality: N/A;   INCISION AND DRAINAGE PERIRECTAL ABSCESS N/A 09/24/2016   Procedure: IRRIGATION AND DEBRIDEMENT PERIRECTAL ABSCESS;  Surgeon: Ricarda Frame, MD;  Location: [facility] ORS;  Service: General;  Laterality: N/A;   none       reports that he has been smoking cigarettes. He has never used smokeless tobacco. He reports  current alcohol use of about 105.0 standard drinks of alcohol per week. He reports current drug use. Drug: Methamphetamines.  Allergies  Allergen Reactions   Tegretol [Carbamazepine] Other (See Comments)    Caused vertigo for 2 days after taking it   Caffeine Palpitations    Family History  Problem Relation Age of Onset   Alcohol abuse Mother    Alcohol abuse Father    Colon cancer Other    Other Other    Cancer Other      Prior to Admission medications   Medication Sig Start Date End Date Taking? Authorizing Provider  levETIRAcetam (KEPPRA) 500 MG tablet Take 1 tablet (500 mg total) by mouth 2 (two) times daily. Patient not taking: Reported on 10/10/2023 10/03/23   Rhetta Mura, MD  pantoprazole (PROTONIX) 40 MG tablet Take 1 tablet (40 mg total) by mouth daily. Patient not taking: Reported on 10/10/2023 10/03/23   Rhetta Mura, MD  famotidine (PEPCID) 20 MG tablet Take 1 tablet (20 mg total) by mouth 2 (two) times daily. Patient not taking: Reported on 06/01/2019 05/14/19 06/02/19  Benjiman Core, MD    Physical Exam: Vitals:   10/16/23 0930 10/16/23 0947 10/16/23 0956 10/16/23 1000  BP: 108/73   106/71  Pulse: 86   80  Resp: 19   14  Temp:   98.4 F (36.9 C)   TempSrc:   Oral   SpO2: 98%   95%  Weight:  62.2 kg    Height:  6' (1.829 m)        Constitutional: Chronically ill cachectic male laying in bed,  AAO x 3 HEENT: No JVD, no icterus CVS: S1-S2, regular rhythm Lungs: Decreased breath sounds to bases Abdomen: Soft, tender in the epigastrium and periumbilical region, bowel sounds decreased Extremities: No edema  Psych: Flat affect Labs on Admission: I have personally reviewed following labs and imaging studies  CBC: Recent Labs  Lab 10/11/23 0418 10/12/23 0440 10/13/23 0508 10/14/23 0515 10/15/23 1930  WBC 4.3 3.2* 2.8* 2.8* 5.0  NEUTROABS  --  1.9  --  1.6*  --   HGB 11.4* 10.4* 10.6* 11.5* 11.4*  HCT 34.3* 31.7* 32.5* 33.7* 33.8*   MCV 100.3* 99.1 100.0 97.4 95.2  PLT 132* 112* 106* 111* 158   Basic Metabolic Panel: Recent Labs  Lab 10/10/23 0041 10/11/23 0418 10/12/23 0440 10/13/23 0508 10/14/23 0515 10/15/23 1930  NA 138 130* 131* 133* 136 138  K 3.8 3.8 3.4* 3.3* 4.0 3.7  CL 106 96* 99 100 102 101  CO2 21* 25 26 29 29 25   GLUCOSE 72 69* 74 120* 127* 135*  BUN 5* <5* <5* <5* <5* <5*  CREATININE 0.47* 0.60* 0.59* 0.38* 0.52* 0.67  CALCIUM 8.0* 8.5* 8.1* 8.2* 8.6* 9.1  MG 2.1 1.7 1.6* 1.6* 1.8  --   PHOS 4.4  --   --   --   --   --    GFR: Estimated Creatinine Clearance: 111.2 mL/min (by C-G formula based on SCr of 0.67 mg/dL). Liver Function Tests: Recent Labs  Lab 10/11/23 0418 10/12/23 0440 10/13/23 0508 10/14/23 0515 10/15/23 1930  AST 23 18 13* 15 16  ALT 17 15 13 12 13   ALKPHOS 62 54 50 58 63  BILITOT 1.2* 1.1 0.5 0.5 0.4  PROT 5.9* 5.5* 5.6* 5.8* 6.7  ALBUMIN 3.0* 2.7* 2.6* 2.8* 3.1*   Recent Labs  Lab 10/12/23 0440 10/13/23 0508 10/14/23 0515 10/15/23 0502 10/15/23 1930  LIPASE 121* 104* 114* 53* 97*   No results for input(s): "AMMONIA" in the last 168 hours. Coagulation Profile: No results for input(s): "INR", "PROTIME" in the last 168 hours. Cardiac Enzymes: No results for input(s): "CKTOTAL", "CKMB", "CKMBINDEX", "TROPONINI" in the last 168 hours. BNP (last 3 results) No results for input(s): "PROBNP" in the last 8760 hours. HbA1C: No results for input(s): "HGBA1C" in the last 72 hours. CBG: No results for input(s): "GLUCAP" in the last 168 hours. Lipid Profile: No results for input(s): "CHOL", "HDL", "LDLCALC", "TRIG", "CHOLHDL", "LDLDIRECT" in the last 72 hours. Thyroid Function Tests: No results for input(s): "TSH", "T4TOTAL", "FREET4", "T3FREE", "THYROIDAB" in the last 72 hours. Anemia Panel: No results for input(s): "VITAMINB12", "FOLATE", "FERRITIN", "TIBC", "IRON", "RETICCTPCT" in the last 72 hours. Urine analysis:    Component Value Date/Time   COLORURINE  YELLOW 10/16/2023 0405   APPEARANCEUR CLEAR 10/16/2023 0405   APPEARANCEUR Clear 11/15/2014 1755   LABSPEC 1.014 10/16/2023 0405   LABSPEC 1.002 11/15/2014 1755   PHURINE 5.0 10/16/2023 0405   GLUCOSEU NEGATIVE 10/16/2023 0405   GLUCOSEU Negative 11/15/2014 1755   HGBUR NEGATIVE 10/16/2023 0405   BILIRUBINUR NEGATIVE 10/16/2023 0405   BILIRUBINUR Negative 11/15/2014 1755   KETONESUR NEGATIVE 10/16/2023 0405   PROTEINUR NEGATIVE 10/16/2023 0405   NITRITE NEGATIVE 10/16/2023 0405   LEUKOCYTESUR NEGATIVE 10/16/2023 0405   LEUKOCYTESUR Negative 11/15/2014 1755   Sepsis Labs: @LABRCNTIP (procalcitonin:4,lacticidven:4) )No results found for this or any previous visit (from the past 240 hour(s)).   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 EXAM: CTA ABDOMEN AND PELVIS WITHOUT AND WITH CONTRAST TECHNIQUE: Multidetector CT imaging of the abdomen and pelvis was performed using the standard protocol during bolus administration of intravenous contrast. Multiplanar reconstructed images and MIPs were obtained and reviewed to evaluate the vascular anatomy. RADIATION DOSE REDUCTION: This exam was performed according to the departmental dose-optimization program which includes automated exposure control, adjustment of the mA and/or kV according to patient size and/or use of iterative reconstruction technique. CONTRAST:  75mL OMNIPAQUE IOHEXOL 350 MG/ML SOLN COMPARISON:  CT Abdomen and Pelvis 10/10/2023 and earlier. FINDINGS: VASCULAR Initial noncontrast images demonstrate minimal left iliac artery calcified plaque and normal caliber of the abdominal aorta. Following contrast the abdominal aorta is patent and normal. The major aortic branches are patent including the celiac and S in the. There is mild ectasia of the left Common iliac artery. Bilateral  iliac and proximal femoral arteries otherwise appear patent and normal. On the delayed portal venous phase images there is mass effect and moderate to severe attenuation of the confluence of the splenic vein, SMV, and main portal vein on series 14, image 28. But those structures remain patent. Tiny nonocclusive SMV thrombus redemonstrated on series 14, image 32, but elsewhere the major mesenteric venous structures appear to be patent. Review of the MIP images confirms the above findings. NON-VASCULAR Lower chest: Heart size remains normal. No pericardial effusion. Elevated left hemidiaphragm is chronic. Trace layering pleural effusions are more apparent. Chronic but increased left lower lobe atelectasis. Mild right lung base atelectasis. Hepatobiliary: Liver enhancement is maintained with evidence of hepatic steatosis. Gallbladder appears negative. No significant bile duct enlargement. Pancreas: Severe pancreatitis redemonstrated. Phlegmon like heterogeneous enlargement of the pancreatic head, uncinate, proximal body. Areas of heterogeneous pancreatic necrosis and progressive cystic spaces within the affected parenchyma individually up to 14-17 mm (series 14, image 31) compatible with developing pseudocysts. Inflammation tracks into the root of the small bowel mesentery. Comparatively small volume of regional free fluid. Secondary inflammation of the stomach and duodenum. Spleen: Negative. Adrenals/Urinary Tract: Negative. Symmetric renal enhancement and contrast excretion. Excreted contrast in the urinary bladder on the 0500 hour repeat images. Stomach/Bowel: Oral contrast mixed with stool in the descending colon. Redundant splenic flexure. Decompressed transverse colon. Normal contrast containing appendix. Indistinct appearance of abdominal small bowel loops, although less dilated since 10/10/2023. Inflammation emitting from the lesser sac. No pneumoperitoneum identified. Lymphatic: No lymphadenopathy identified.  Reproductive: Stable. Other: Small volume of free fluid in the pelvis with simple fluid density. Musculoskeletal: No acute osseous abnormality identified. Not present at 0218 hours, but present on repeat images at 0522 hours is patchy contrast and/or enhancement in the left lower lateral chest wall from the lower pectoralis muscle tracking laterally on series 12, image 1. This persists on the delayed portal venous phase images. Underlying ribs appear intact. Furthermore, there is contrast extravasation into the adjacent left upper extremity visible there. These are poorly visible on the scout view. However, on series 21 coronal images the upper extremity extravasation is visible with associated soft tissue edema. IMPRESSION: 1. Ongoing Severe Pancreatitis. Heterogeneous pancreatic phlegmon, necrosis with developing small pseudocysts in the head, uncinate, and proximal body (currently up to 1.7 cm). 2. Regional inflammation, with mass effect on the confluence of the major portal venous structures. The portal venous system does remain patent, although there is again evidence of a small nonocclusive thrombus there at the SMV. 3. Significant Contrast Extravasation into the Left upper extremity,  and also visible at the left lateral chest wall (contrast probably migrated into the chest from a ruptured regional vein such as in near the axilla). Recommend cold compresses, elevation of the left extremity, physical exam checks, and plastic surgery consultation if any skin changes are observed. 4. Small volume of free fluid in the abdomen and pelvis, trace pleural effusions. Lung base atelectasis. Electronically Signed   By: Odessa Fleming M.D.   On: 10/16/2023 06:45     Assessment/Plan    Acute alcoholic pancreatitis -Recent admission last week for same, just left yesterday, repeat imaging with severe pancreatitis, heterogenous phlegmon and necrosis with developing small pseudocysts -Etiology is ongoing heavy EtOH abuse,  drank some vodka yesterday after discharge despite the hospitalization -Further complicated by homelessness and lack of social support -IV fluids, bowel rest, IV meropenem, GI consult -Discussed importance of EtOH cessation    Polysubstance abuse (HCC)   Homelessness    Alcohol use disorder, severe, dependence (HCC) -Unlikely to withdraw, since he just left the hospital -Thiamine, multivitamins    HIV (human immunodeficiency virus infection) (HCC) -Has not been taking Biktarvy, last viral load was 47K in September with CD4 count of 48 -Needs infectious disease follow-up    Seizure disorder (HCC) -likely related to ETOH use -Resume Keppra    Protein-calorie malnutrition, severe -Secondary to above, n.p.o. 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

## 2023-10-17 ENCOUNTER — Encounter (HOSPITAL_COMMUNITY): Payer: Self-pay | Admitting: Internal Medicine

## 2023-10-17 DIAGNOSIS — K8522 Alcohol induced acute pancreatitis with infected necrosis: Secondary | ICD-10-CM

## 2023-10-17 LAB — CBC
HCT: 32 % — ABNORMAL LOW (ref 39.0–52.0)
Hemoglobin: 10.6 g/dL — ABNORMAL LOW (ref 13.0–17.0)
MCH: 31.7 pg (ref 26.0–34.0)
MCHC: 33.1 g/dL (ref 30.0–36.0)
MCV: 95.8 fL (ref 80.0–100.0)
Platelets: 132 10*3/uL — ABNORMAL LOW (ref 150–400)
RBC: 3.34 MIL/uL — ABNORMAL LOW (ref 4.22–5.81)
RDW: 14.3 % (ref 11.5–15.5)
WBC: 2.6 10*3/uL — ABNORMAL LOW (ref 4.0–10.5)
nRBC: 0 % (ref 0.0–0.2)

## 2023-10-17 LAB — COMPREHENSIVE METABOLIC PANEL
ALT: 12 U/L (ref 0–44)
AST: 17 U/L (ref 15–41)
Albumin: 2.5 g/dL — ABNORMAL LOW (ref 3.5–5.0)
Alkaline Phosphatase: 50 U/L (ref 38–126)
Anion gap: 11 (ref 5–15)
BUN: <5 mg/dL — ABNORMAL LOW (ref 6–20)
CO2: 22 mmol/L (ref 22–32)
Calcium: 8.1 mg/dL — ABNORMAL LOW (ref 8.9–10.3)
Chloride: 104 mmol/L (ref 98–111)
Creatinine, Ser: 0.64 mg/dL (ref 0.61–1.24)
GFR, Estimated: >60 mL/min (ref >60)
Glucose, Bld: 72 mg/dL (ref 70–99)
Potassium: 3.4 mmol/L — ABNORMAL LOW (ref 3.5–5.1)
Sodium: 137 mmol/L (ref 135–145)
Total Bilirubin: 0.7 mg/dL (ref <1.2)
Total Protein: 5.6 g/dL — ABNORMAL LOW (ref 6.5–8.1)

## 2023-10-17 LAB — LIPASE, BLOOD: Lipase: 67 U/L — ABNORMAL HIGH (ref 11–51)

## 2023-10-17 MED ORDER — LORAZEPAM 2 MG/ML IJ SOLN
1.0000 mg | INTRAMUSCULAR | Status: DC | PRN
  Administered 2023-10-17: 1 mg via INTRAVENOUS
  Administered 2023-10-17 – 2023-10-18 (×2): 2 mg via INTRAVENOUS
  Filled 2023-10-17 (×3): qty 1

## 2023-10-17 MED ORDER — SODIUM CHLORIDE 0.9% FLUSH
3.0000 mL | Freq: Two times a day (BID) | INTRAVENOUS | Status: DC
  Administered 2023-10-18 – 2023-10-20 (×5): 3 mL via INTRAVENOUS

## 2023-10-17 MED ORDER — FOLIC ACID 1 MG PO TABS
1.0000 mg | ORAL_TABLET | Freq: Every day | ORAL | Status: DC
  Administered 2023-10-17 – 2023-10-20 (×4): 1 mg via ORAL
  Filled 2023-10-17 (×4): qty 1

## 2023-10-17 MED ORDER — THIAMINE HCL 100 MG/ML IJ SOLN
100.0000 mg | Freq: Every day | INTRAMUSCULAR | Status: DC
  Filled 2023-10-17: qty 2

## 2023-10-17 MED ORDER — SODIUM CHLORIDE 0.9% FLUSH
3.0000 mL | INTRAVENOUS | Status: DC | PRN
  Administered 2023-10-17: 3 mL via INTRAVENOUS

## 2023-10-17 MED ORDER — LORAZEPAM 1 MG PO TABS: 1.0000 mg | ORAL_TABLET | ORAL | Status: DC | PRN

## 2023-10-17 MED ORDER — ADULT MULTIVITAMIN W/MINERALS CH
1.0000 | ORAL_TABLET | Freq: Every day | ORAL | Status: DC
  Administered 2023-10-17 – 2023-10-20 (×4): 1 via ORAL
  Filled 2023-10-17 (×4): qty 1

## 2023-10-17 MED ORDER — THIAMINE MONONITRATE 100 MG PO TABS
100.0000 mg | ORAL_TABLET | Freq: Every day | ORAL | Status: DC
  Administered 2023-10-17 – 2023-10-20 (×4): 100 mg via ORAL
  Filled 2023-10-17 (×4): qty 1

## 2023-10-17 MED ORDER — SODIUM CHLORIDE 0.9 % IV SOLN: 250.0000 mL | INTRAVENOUS | Status: AC | PRN

## 2023-10-17 MED ORDER — DM-GUAIFENESIN ER 30-600 MG PO TB12
1.0000 | ORAL_TABLET | Freq: Two times a day (BID) | ORAL | Status: DC
  Administered 2023-10-17 – 2023-10-20 (×6): 1 via ORAL
  Filled 2023-10-17 (×7): qty 1

## 2023-10-17 MED ORDER — PANCRELIPASE (LIP-PROT-AMYL) 36000-114000 UNITS PO CPEP
36000.0000 [IU] | ORAL_CAPSULE | Freq: Three times a day (TID) | ORAL | Status: DC
  Administered 2023-10-17 – 2023-10-20 (×9): 36000 [IU] via ORAL
  Filled 2023-10-17 (×11): qty 1

## 2023-10-17 NOTE — Plan of Care (Signed)

## 2023-10-17 NOTE — Progress Notes (Signed)
Alcohol withdrawal: Patient's nurse reported that patient is saying he has last drink 2 days ago.  Having some visual hallucination and patient is restless - Moderate CIWA protocol with Ativan as needed. Implicated alcohol drug protocol. - Continue folic acid, thiamine and multivitamin

## 2023-10-17 NOTE — Consult Note (Signed)
Reason for Consult: Pancreatitis Referring Physician: Hospital team  Gary [name] is an 37 y.o. male.  HPI: Patient seen and examined in his hospital computer chart reviewed and his case discussed with the hospitalist yesterday and he does want to eat and he does know that alcohol causes his pancreatitis however since he has no money and cannot afford his medicine drinking does help the pain for a short time and we had a long talk about alcohol rehab social services Medicaid disability and he is in significant need of community wide help but has no other complaints  Past Medical History:  Diagnosis Date   Alcohol abuse    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   Anxiety    Bipolar 2 disorder (HCC)    HIV (human immunodeficiency virus infection) (HCC)    Pancreatitis    PTSD (post-traumatic stress disorder)    Schizophrenia (HCC)    Seizures (HCC)    Subdural hematoma (HCC)     Past Surgical History:  Procedure Laterality Date   BIOPSY  04/19/2022   Procedure: BIOPSY;  Surgeon: Lemar Lofty., MD;  Location: [facility] ENDOSCOPY;  Service: Gastroenterology;;   ENTEROSCOPY N/A 04/19/2022   Procedure: ENTEROSCOPY;  Surgeon: Lemar Lofty., MD;  Location: [facility] ENDOSCOPY;  Service: Gastroenterology;  Laterality: N/A;   INCISION AND DRAINAGE PERIRECTAL ABSCESS N/A 09/24/2016   Procedure: IRRIGATION AND DEBRIDEMENT PERIRECTAL ABSCESS;  Surgeon: Ricarda Frame, MD;  Location: [facility] ORS;  Service: General;  Laterality: N/A;   none      Family History  Problem Relation Age of Onset   Alcohol abuse Mother    Alcohol abuse Father    Colon cancer Other    Other Other    Cancer Other     Social History:  reports that he has been smoking cigarettes. He has never used smokeless tobacco. He reports current alcohol use of about 105.0 standard drinks of alcohol per week. He reports current drug use. Drug: Methamphetamines.  Allergies:  Allergies  Allergen Reactions   Tegretol  [Carbamazepine] Other (See Comments)    Caused vertigo for 2 days after taking it   Caffeine Palpitations    Medications: I have reviewed the patient's current medications.  Results for orders placed or performed during the hospital encounter of 10/15/23 (from the past 48 hour(s))  Lipase, blood     Status: Abnormal   Collection Time: 10/15/23  7:30 PM  Result Value Ref Range   Lipase 97 (H) 11 - 51 U/L    Comment: Performed at [facility], [address], [place], [place] [address]  Comprehensive metabolic panel     Status: Abnormal   Collection Time: 10/15/23  7:30 PM  Result Value Ref Range   Sodium 138 135 - 145 mmol/L   Potassium 3.7 3.5 - 5.1 mmol/L   Chloride 101 98 - 111 mmol/L   CO2 25 22 - 32 mmol/L   Glucose, Bld 135 (H) 70 - 99 mg/dL    Comment: Glucose reference range applies only to samples taken after fasting for at least 8 hours.   BUN <5 (L) 6 - 20 mg/dL   Creatinine, Ser 6.04 0.61 - 1.24 mg/dL   Calcium 9.1 8.9 - 54.0 mg/dL   Total Protein 6.7 6.5 - 8.1 g/dL   Albumin 3.1 (L) 3.5 - 5.0 g/dL   AST 16 15 - 41 U/L   ALT 13 0 - 44 U/L   Alkaline Phosphatase 63 38 - 126 U/L   Total  Bilirubin 0.4 <1.2 mg/dL   GFR, Estimated >16 >10 mL/min    Comment: (NOTE) Calculated using the CKD-EPI Creatinine Equation (2021)    Anion gap 12 5 - 15    Comment: Performed at [facility], [address] [place], [place] [address]  CBC     Status: Abnormal   Collection Time: 10/15/23  7:30 PM  Result Value Ref Range   WBC 5.0 4.0 - 10.5 K/uL   RBC 3.55 (L) 4.22 - 5.81 MIL/uL   Hemoglobin 11.4 (L) 13.0 - 17.0 g/dL   HCT 40.9 (L) 81.1 - 91.4 %   MCV 95.2 80.0 - 100.0 fL   MCH 32.1 26.0 - 34.0 pg   MCHC 33.7 30.0 - 36.0 g/dL   RDW 78.2 95.6 - 21.3 %   Platelets 158 150 - 400 K/uL   nRBC 0.0 0.0 - 0.2 %    Comment: Performed at [facility], [address], [place], [place] [address]  I-Stat CG4 Lactic Acid     Status: Abnormal   Collection Time:  10/16/23  2:21 AM  Result Value Ref Range   Lactic Acid, Venous 2.1 (HH) 0.5 - 1.9 mmol/L   Comment NOTIFIED PHYSICIAN   Urinalysis, Routine w reflex microscopic -Urine, Clean Catch     Status: None   Collection Time: 10/16/23  4:05 AM  Result Value Ref Range   Color, Urine YELLOW YELLOW   APPearance CLEAR CLEAR   Specific Gravity, Urine 1.014 1.005 - 1.030   pH 5.0 5.0 - 8.0   Glucose, UA NEGATIVE NEGATIVE mg/dL   Hgb urine dipstick NEGATIVE NEGATIVE   Bilirubin Urine NEGATIVE NEGATIVE   Ketones, ur NEGATIVE NEGATIVE mg/dL   Protein, ur NEGATIVE NEGATIVE mg/dL   Nitrite NEGATIVE NEGATIVE   Leukocytes,Ua NEGATIVE NEGATIVE    Comment: Performed at [facility], [address] [place], [place] [address]  I-Stat CG4 Lactic Acid     Status: None   Collection Time: 10/16/23  6:17 AM  Result Value Ref Range   Lactic Acid, Venous 0.5 0.5 - 1.9 mmol/L  CBC     Status: Abnormal   Collection Time: 10/16/23 12:12 PM  Result Value Ref Range   WBC 3.3 (L) 4.0 - 10.5 K/uL   RBC 3.45 (L) 4.22 - 5.81 MIL/uL   Hemoglobin 11.0 (L) 13.0 - 17.0 g/dL   HCT 29.5 (L) 28.4 - 13.2 %   MCV 95.1 80.0 - 100.0 fL   MCH 31.9 26.0 - 34.0 pg   MCHC 33.5 30.0 - 36.0 g/dL   RDW 44.0 10.2 - 72.5 %   Platelets 161 150 - 400 K/uL   nRBC 0.0 0.0 - 0.2 %    Comment: Performed at [facility], [address] [place], [place] [address]  Basic metabolic panel     Status: Abnormal   Collection Time: 10/16/23 12:12 PM  Result Value Ref Range   Sodium 138 135 - 145 mmol/L   Potassium 4.0 3.5 - 5.1 mmol/L   Chloride 103 98 - 111 mmol/L   CO2 24 22 - 32 mmol/L   Glucose, Bld 85 70 - 99 mg/dL    Comment: Glucose reference range applies only to samples taken after fasting for at least 8 hours.   BUN <5 (L) 6 - 20 mg/dL   Creatinine, Ser 0.34 0.61 - 1.24 mg/dL   Calcium 8.5 (L) 8.9 - 10.3 mg/dL   GFR, Estimated >74 >25 mL/min  Comment: (NOTE) Calculated using the CKD-EPI Creatinine Equation (2021)     Anion gap 11 5 - 15    Comment: Performed at [facility], [address], [place], [place] [address]  CBC     Status: Abnormal   Collection Time: 10/17/23  4:38 AM  Result Value Ref Range   WBC 2.6 (L) 4.0 - 10.5 K/uL   RBC 3.34 (L) 4.22 - 5.81 MIL/uL   Hemoglobin 10.6 (L) 13.0 - 17.0 g/dL   HCT 11.9 (L) 14.7 - 82.9 %   MCV 95.8 80.0 - 100.0 fL   MCH 31.7 26.0 - 34.0 pg   MCHC 33.1 30.0 - 36.0 g/dL   RDW 56.2 13.0 - 86.5 %   Platelets 132 (L) 150 - 400 K/uL   nRBC 0.0 0.0 - 0.2 %    Comment: Performed at [facility], [address] [place], [place] [address]  Comprehensive metabolic panel     Status: Abnormal   Collection Time: 10/17/23  4:38 AM  Result Value Ref Range   Sodium 137 135 - 145 mmol/L   Potassium 3.4 (L) 3.5 - 5.1 mmol/L   Chloride 104 98 - 111 mmol/L   CO2 22 22 - 32 mmol/L   Glucose, Bld 72 70 - 99 mg/dL    Comment: Glucose reference range applies only to samples taken after fasting for at least 8 hours.   BUN <5 (L) 6 - 20 mg/dL   Creatinine, Ser 6.29 0.61 - 1.24 mg/dL   Calcium 8.1 (L) 8.9 - 10.3 mg/dL   Total Protein 5.6 (L) 6.5 - 8.1 g/dL   Albumin 2.5 (L) 3.5 - 5.0 g/dL   AST 17 15 - 41 U/L   ALT 12 0 - 44 U/L   Alkaline Phosphatase 50 38 - 126 U/L   Total Bilirubin 0.7 <1.2 mg/dL   GFR, Estimated >52 >84 mL/min    Comment: (NOTE) Calculated using the CKD-EPI Creatinine Equation (2021)    Anion gap 11 5 - 15    Comment: Performed at [facility], [address], [place], [place] [address]  Lipase, blood     Status: Abnormal   Collection Time: 10/17/23  4:38 AM  Result Value Ref Range   Lipase 67 (H) 11 - 51 U/L    Comment: Performed at [facility], 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 EXAM: CTA ABDOMEN AND PELVIS WITHOUT AND WITH CONTRAST  TECHNIQUE: Multidetector CT imaging of the abdomen and pelvis was performed using the standard protocol during bolus administration of intravenous contrast. Multiplanar reconstructed images and MIPs were obtained and reviewed to evaluate the vascular anatomy. RADIATION DOSE REDUCTION: This exam was performed according to the departmental dose-optimization program which includes automated exposure control, adjustment of the mA and/or kV according to patient size and/or use of iterative reconstruction technique. CONTRAST:  75mL OMNIPAQUE IOHEXOL 350 MG/ML SOLN COMPARISON:  CT Abdomen and Pelvis 10/10/2023 and earlier. FINDINGS: VASCULAR Initial noncontrast images demonstrate minimal left iliac artery calcified plaque and normal caliber of the abdominal aorta. Following contrast the abdominal aorta is patent and normal. The major aortic branches are patent including the celiac and S in the. There is mild ectasia of the left Common iliac artery. Bilateral iliac and proximal femoral arteries otherwise appear patent and  normal. On the delayed portal venous phase images there is mass effect and moderate to severe attenuation of the confluence of the splenic vein, SMV, and main portal vein on series 14, image 28. But those structures remain patent. Tiny nonocclusive SMV thrombus redemonstrated on series 14, image 32, but elsewhere the major mesenteric venous structures appear to be patent. Review of the MIP images confirms the above findings. NON-VASCULAR Lower chest: Heart size remains normal. No pericardial effusion. Elevated left hemidiaphragm is chronic. Trace layering pleural effusions are more apparent. Chronic but increased left lower lobe atelectasis. Mild right lung base atelectasis. Hepatobiliary: Liver enhancement is maintained with evidence of hepatic steatosis. Gallbladder appears negative. No significant bile duct enlargement. Pancreas: Severe pancreatitis redemonstrated. Phlegmon like heterogeneous  enlargement of the pancreatic head, uncinate, proximal body. Areas of heterogeneous pancreatic necrosis and progressive cystic spaces within the affected parenchyma individually up to 14-17 mm (series 14, image 31) compatible with developing pseudocysts. Inflammation tracks into the root of the small bowel mesentery. Comparatively small volume of regional free fluid. Secondary inflammation of the stomach and duodenum. Spleen: Negative. Adrenals/Urinary Tract: Negative. Symmetric renal enhancement and contrast excretion. Excreted contrast in the urinary bladder on the 0500 hour repeat images. Stomach/Bowel: Oral contrast mixed with stool in the descending colon. Redundant splenic flexure. Decompressed transverse colon. Normal contrast containing appendix. Indistinct appearance of abdominal small bowel loops, although less dilated since 10/10/2023. Inflammation emitting from the lesser sac. No pneumoperitoneum identified. Lymphatic: No lymphadenopathy identified. Reproductive: Stable. Other: Small volume of free fluid in the pelvis with simple fluid density. Musculoskeletal: No acute osseous abnormality identified. Not present at 0218 hours, but present on repeat images at 0522 hours is patchy contrast and/or enhancement in the left lower lateral chest wall from the lower pectoralis muscle tracking laterally on series 12, image 1. This persists on the delayed portal venous phase images. Underlying ribs appear intact. Furthermore, there is contrast extravasation into the adjacent left upper extremity visible there. These are poorly visible on the scout view. However, on series 21 coronal images the upper extremity extravasation is visible with associated soft tissue edema. IMPRESSION: 1. Ongoing Severe Pancreatitis. Heterogeneous pancreatic phlegmon, necrosis with developing small pseudocysts in the head, uncinate, and proximal body (currently up to 1.7 cm). 2. Regional inflammation, with mass effect on the confluence  of the major portal venous structures. The portal venous system does remain patent, although there is again evidence of a small nonocclusive thrombus there at the SMV. 3. Significant Contrast Extravasation into the Left upper extremity, and also visible at the left lateral chest wall (contrast probably migrated into the chest from a ruptured regional vein such as in near the axilla). Recommend cold compresses, elevation of the left extremity, physical exam checks, and plastic surgery consultation if any skin changes are observed. 4. Small volume of free fluid in the abdomen and pelvis, trace pleural effusions. Lung base atelectasis. Electronically Signed   By: Odessa Fleming M.D.   On: 10/16/2023 06:45    ROS negative except above Blood pressure (!) 93/56, pulse 64, temperature 98.1 F (36.7 C), temperature source Oral, resp. rate 16, height 6' (1.829 m), weight 62.2 kg, SpO2 92%. Physical Exam thin no acute distress lying comfortably in the bed is upper abdomen is tender without any rebound BUN and creatinine okay lipase slight decrease liver tests okay CBC okay CT looks worse than patient does  Assessment/Plan: Multiple medical problems including alcoholic pancreatitis in dire need of social services long-term Plan: Will  allow clear liquids if he does well may slowly advance his diet to low-fat and would use high-dose pancreatic enzymes and probably would benefit from an infectious disease consult as well as psychiatry and probably needs a rehab unit of some kind and certainly a better housing situation as well as a way to get medicines and I will ask my rounding partner to check on him later in the week but please call Dr. Bosie Clos next week if any specific GI question or problem  Zalayah Pizzuto E 10/17/2023, 11:10 AM

## 2023-10-17 NOTE — Progress Notes (Signed)
PROGRESS NOTE    Gary [name]  ZOX:[id number] DOB: 1986/05/25 DOA: 10/15/2023 PCP: Default, Provider, MD    Brief Narrative:  37 year old with ongoing alcohol use, recurrent pancreatitis, HIV, seizures, mesenteric thrombus, discharged 1 day prior, went out of the hospital, started drinking vodka came back to the ER with worsening pain and unable to keep anything down.  He was discharged in the afternoon, went to downtown had some drinks and came back to the ER.  Subjective: Patient seen and examined.  He tells me he is starving.  He is also telling me that he needs stronger pain medications.  Not very keen to conversation. Assessment & Plan:   Acute recurrent alcoholic pancreatitis, ongoing use of alcohol.  Necrosis. Adequate IV fluids.  Challenging with clears.  Adequate oral and IV opiates for pain relief.  IV antibiotics with meropenem.  GI following. Pancreatic enzyme supplements.  Alcohol use disorder with dependence: Risk of withdrawal however he was in the hospital for more than 5 days.  Multivitamins.  HIV: On Biktarvy.  Not sure he is taking. Seizure disorder: On Keppra. Protein-calorie malnutrition, high risk of protein calorie malnutrition and decompensation.  Monitor electrolytes closely.  Starting clear liquid diet today.  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  Not a candidate for anticoagulation given ongoing alcohol use and noncompliance.  Hypokalemia: Replace.   DVT prophylaxis: enoxaparin (LOVENOX) injection 40 mg Start: 10/16/23 1200   Code Status: Full code Family Communication: None at bedside Disposition Plan: Status is: Inpatient Remains inpatient appropriate because: Significant belly pain     Consultants:  GI  Procedures:  None  Antimicrobials:  Meropenem 12/7---     Objective: Vitals:   10/16/23 2115 10/16/23 2338 10/17/23 0609 10/17/23 0749  BP: 94/61 105/73 94/71 (!) 93/56  Pulse: (!) 54 71 60 64  Resp: 18 18 18 16   Temp:  98.5 F (36.9 C) 98.4 F (36.9 C) 98.6 F (37 C) 98.1 F (36.7 C)  TempSrc: Oral Oral Oral Oral  SpO2: 98% 98% 94% 92%  Weight:      Height:        Intake/Output Summary (Last 24 hours) at 10/17/2023 1350 Last data filed at 10/17/2023 1224 Gross per 24 hour  Intake 2713.83 ml  Output 1050 ml  Net 1663.83 ml   Filed Weights   10/16/23 0947  Weight: 62.2 kg    Examination:  General: Chronically sick looking.  In moderate distress due to pain. Cardiovascular: S1-S2 normal.  Regular rate rhythm. Respiratory: Bilateral clear.  No added sounds. Gastrointestinal: Soft but diffusely tender.  Uncooperative to exam.  Bowel sound present. Ext: No swelling or edema.     Data Reviewed: I have personally reviewed following labs and imaging studies  CBC: Recent Labs  Lab 10/12/23 0440 10/13/23 0508 10/14/23 0515 10/15/23 1930 10/16/23 1212 10/17/23 0438  WBC 3.2* 2.8* 2.8* 5.0 3.3* 2.6*  NEUTROABS 1.9  --  1.6*  --   --   --   HGB 10.4* 10.6* 11.5* 11.4* 11.0* 10.6*  HCT 31.7* 32.5* 33.7* 33.8* 32.8* 32.0*  MCV 99.1 100.0 97.4 95.2 95.1 95.8  PLT 112* 106* 111* 158 161 132*   Basic Metabolic Panel: Recent Labs  Lab 10/11/23 0418 10/12/23 0440 10/13/23 0508 10/14/23 0515 10/15/23 1930 10/16/23 1212 10/17/23 0438  NA 130* 131* 133* 136 138 138 137  K 3.8 3.4* 3.3* 4.0 3.7 4.0 3.4*  CL 96* 99 100 102 101 103 104  CO2 25 26 29 29  25  24 22  GLUCOSE 69* 74 120* 127* 135* 85 72  BUN <5* <5* <5* <5* <5* <5* <5*  CREATININE 0.60* 0.59* 0.38* 0.52* 0.67 0.63 0.64  CALCIUM 8.5* 8.1* 8.2* 8.6* 9.1 8.5* 8.1*  MG 1.7 1.6* 1.6* 1.8  --   --   --    GFR: Estimated Creatinine Clearance: 111.2 mL/min (by C-G formula based on SCr of 0.64 mg/dL). Liver Function Tests: Recent Labs  Lab 10/12/23 0440 10/13/23 0508 10/14/23 0515 10/15/23 1930 10/17/23 0438  AST 18 13* 15 16 17   ALT 15 13 12 13 12   ALKPHOS 54 50 58 63 50  BILITOT 1.1 0.5 0.5 0.4 0.7  PROT 5.5* 5.6* 5.8*  6.7 5.6*  ALBUMIN 2.7* 2.6* 2.8* 3.1* 2.5*   Recent Labs  Lab 10/13/23 0508 10/14/23 0515 10/15/23 0502 10/15/23 1930 10/17/23 0438  LIPASE 104* 114* 53* 97* 67*   No results for input(s): "AMMONIA" in the last 168 hours. Coagulation Profile: No results for input(s): "INR", "PROTIME" in the last 168 hours. Cardiac Enzymes: No results for input(s): "CKTOTAL", "CKMB", "CKMBINDEX", "TROPONINI" in the last 168 hours. BNP (last 3 results) No results for input(s): "PROBNP" in the last 8760 hours. HbA1C: No results for input(s): "HGBA1C" in the last 72 hours. CBG: No results for input(s): "GLUCAP" in the last 168 hours. Lipid Profile: No results for input(s): "CHOL", "HDL", "LDLCALC", "TRIG", "CHOLHDL", "LDLDIRECT" in the last 72 hours. Thyroid Function Tests: No results for input(s): "TSH", "T4TOTAL", "FREET4", "T3FREE", "THYROIDAB" in the last 72 hours. Anemia Panel: No results for input(s): "VITAMINB12", "FOLATE", "FERRITIN", "TIBC", "IRON", "RETICCTPCT" in the last 72 hours. Sepsis Labs: Recent Labs  Lab 10/16/23 0221 10/16/23 0617  LATICACIDVEN 2.1* 0.5    No results found for this or any previous visit (from the past 240 hour(s)).       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 EXAM: CTA ABDOMEN AND PELVIS WITHOUT AND WITH CONTRAST TECHNIQUE: Multidetector CT imaging of the abdomen and pelvis was performed using the standard protocol during bolus administration of intravenous contrast. Multiplanar reconstructed images and MIPs were obtained and reviewed to evaluate the vascular anatomy. RADIATION DOSE REDUCTION: This exam was performed according to the departmental dose-optimization program which includes automated exposure control, adjustment of the mA and/or kV according to patient size and/or use of iterative reconstruction  technique. CONTRAST:  75mL OMNIPAQUE IOHEXOL 350 MG/ML SOLN COMPARISON:  CT Abdomen and Pelvis 10/10/2023 and earlier. FINDINGS: VASCULAR Initial noncontrast images demonstrate minimal left iliac artery calcified plaque and normal caliber of the abdominal aorta. Following contrast the abdominal aorta is patent and normal. The major aortic branches are patent including the celiac and S in the. There is mild ectasia of the left Common iliac artery. Bilateral iliac and proximal femoral arteries otherwise appear patent and normal. On the delayed portal venous phase images there is mass effect and moderate to severe attenuation of the confluence of the splenic vein, SMV, and main portal vein on series 14, image 28. But those structures remain patent. Tiny nonocclusive SMV thrombus redemonstrated on series 14, image 32, but elsewhere the major mesenteric venous structures appear to be patent. Review of the MIP images confirms the above findings. NON-VASCULAR Lower chest: Heart size remains normal. No pericardial effusion. Elevated left hemidiaphragm is chronic. Trace layering pleural effusions are more apparent. Chronic but increased left lower lobe atelectasis.  Mild right lung base atelectasis. Hepatobiliary: Liver enhancement is maintained with evidence of hepatic steatosis. Gallbladder appears negative. No significant bile duct enlargement. Pancreas: Severe pancreatitis redemonstrated. Phlegmon like heterogeneous enlargement of the pancreatic head, uncinate, proximal body. Areas of heterogeneous pancreatic necrosis and progressive cystic spaces within the affected parenchyma individually up to 14-17 mm (series 14, image 31) compatible with developing pseudocysts. Inflammation tracks into the root of the small bowel mesentery. Comparatively small volume of regional free fluid. Secondary inflammation of the stomach and duodenum. Spleen: Negative. Adrenals/Urinary Tract: Negative. Symmetric renal enhancement and contrast  excretion. Excreted contrast in the urinary bladder on the 0500 hour repeat images. Stomach/Bowel: Oral contrast mixed with stool in the descending colon. Redundant splenic flexure. Decompressed transverse colon. Normal contrast containing appendix. Indistinct appearance of abdominal small bowel loops, although less dilated since 10/10/2023. Inflammation emitting from the lesser sac. No pneumoperitoneum identified. Lymphatic: No lymphadenopathy identified. Reproductive: Stable. Other: Small volume of free fluid in the pelvis with simple fluid density. Musculoskeletal: No acute osseous abnormality identified. Not present at 0218 hours, but present on repeat images at 0522 hours is patchy contrast and/or enhancement in the left lower lateral chest wall from the lower pectoralis muscle tracking laterally on series 12, image 1. This persists on the delayed portal venous phase images. Underlying ribs appear intact. Furthermore, there is contrast extravasation into the adjacent left upper extremity visible there. These are poorly visible on the scout view. However, on series 21 coronal images the upper extremity extravasation is visible with associated soft tissue edema. IMPRESSION: 1. Ongoing Severe Pancreatitis. Heterogeneous pancreatic phlegmon, necrosis with developing small pseudocysts in the head, uncinate, and proximal body (currently up to 1.7 cm). 2. Regional inflammation, with mass effect on the confluence of the major portal venous structures. The portal venous system does remain patent, although there is again evidence of a small nonocclusive thrombus there at the SMV. 3. Significant Contrast Extravasation into the Left upper extremity, and also visible at the left lateral chest wall (contrast probably migrated into the chest from a ruptured regional vein such as in near the axilla). Recommend cold compresses, elevation of the left extremity, physical exam checks, and plastic surgery consultation if any skin  changes are observed. 4. Small volume of free fluid in the abdomen and pelvis, trace pleural effusions. Lung base atelectasis. Electronically Signed   By: Odessa Fleming M.D.   On: 10/16/2023 06:45        Scheduled Meds:  enoxaparin (LOVENOX) injection  40 mg Subcutaneous Q24H   levETIRAcetam  500 mg Oral BID   lipase/protease/amylase  36,000 Units Oral TID AC   pantoprazole (PROTONIX) IV  40 mg Intravenous Q24H   thiamine (VITAMIN B1) injection  100 mg Intravenous Daily   Continuous Infusions:  sodium chloride 125 mL/hr at 10/17/23 0911   meropenem (MERREM) IV 1 g (10/17/23 1322)     LOS: 1 day    Time spent: 35 minutes    Dorcas Carrow, MD [facility]

## 2023-10-18 DIAGNOSIS — K8522 Alcohol induced acute pancreatitis with infected necrosis: Secondary | ICD-10-CM | POA: Diagnosis not present

## 2023-10-18 MED ORDER — SODIUM CHLORIDE 0.9 % IV BOLUS
1000.0000 mL | Freq: Once | INTRAVENOUS | Status: AC
  Administered 2023-10-18: 1000 mL via INTRAVENOUS

## 2023-10-18 MED ORDER — ENSURE ENLIVE PO LIQD
237.0000 mL | Freq: Two times a day (BID) | ORAL | Status: DC
  Administered 2023-10-18: 237 mL via ORAL

## 2023-10-18 NOTE — Progress Notes (Signed)
PROGRESS NOTE    Gary [name]  OZH:[id number] DOB: [birth year] DOA: 10/15/2023 PCP: Default, Provider, MD    Brief Narrative:  37 year old with ongoing alcohol use, recurrent pancreatitis, HIV, seizures, mesenteric thrombus, discharged 1 day prior, went out of the hospital, started drinking vodka came back to the ER with worsening pain and unable to keep anything down.  He was discharged in the afternoon, went to downtown had some drinks and came back to the ER.  Subjective:  Patient was seen and examined.  Keeps complaining of pain.  Overnight lower blood pressures.  He needs both Ativan and Dilaudid. Additional 1 L fluid boluses given.  Continues to complain of pain. Multiple times called out to ask to eat.  He wants to eat some soft diet.  Denies any vomiting but has ongoing pain.   Assessment & Plan:   Acute recurrent alcoholic pancreatitis, ongoing use of alcohol.  Necrosis of the pancreas. Adequate IV fluids.  1 L bolus today.  Continue maintenance IV fluids.  Adequate oral and IV opiates for pain relief.  IV antibiotics with meropenem.  GI following. Pancreatic enzyme supplements. Wants to try soft food.  Will advance diet.  Alcohol use disorder with dependence: Risk of withdrawal however he was in the hospital for more than 5 days.  Multivitamins.  Low-dose CIWA protocol.  HIV: On Biktarvy.  Not sure he is taking. Seizure disorder: On Keppra. Protein-calorie malnutrition, high risk of protein calorie malnutrition and decompensation.  Monitor electrolytes closely.   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  Not a candidate for anticoagulation given ongoing alcohol use and noncompliance.  Hypokalemia: Replaced and recheck tomorrow.   DVT prophylaxis: enoxaparin (LOVENOX) injection 40 mg Start: 10/16/23 1200   Code Status: Full code Family Communication: None at bedside Disposition Plan: Status is: Inpatient Remains inpatient appropriate because: Significant belly  pain     Consultants:  GI  Procedures:  None  Antimicrobials:  Meropenem 12/7---     Objective: Vitals:   10/18/23 0912 10/18/23 0915 10/18/23 0935 10/18/23 1213  BP: (!) 86/66 (!) 86/66 96/64 102/68  Pulse: 68  72 65  Resp:      Temp:      TempSrc:      SpO2:      Weight:      Height:        Intake/Output Summary (Last 24 hours) at 10/18/2023 1322 Last data filed at 10/18/2023 6295 Gross per 24 hour  Intake 2351.8 ml  Output 2050 ml  Net 301.8 ml   Filed Weights   10/16/23 0947  Weight: 62.2 kg    Examination:  General: Sick looking.  Frail.  Flat affect.  Anxious. Cardiovascular: S1-S2 normal.  Regular rate rhythm. Respiratory: Bilateral clear.  No added sounds. Gastrointestinal: Soft but diffusely tender.  Uncooperative to exam.  Bowel sound present. Ext: No swelling or edema.     Data Reviewed: I have personally reviewed following labs and imaging studies  CBC: Recent Labs  Lab 10/12/23 0440 10/13/23 0508 10/14/23 0515 10/15/23 1930 10/16/23 1212 10/17/23 0438  WBC 3.2* 2.8* 2.8* 5.0 3.3* 2.6*  NEUTROABS 1.9  --  1.6*  --   --   --   HGB 10.4* 10.6* 11.5* 11.4* 11.0* 10.6*  HCT 31.7* 32.5* 33.7* 33.8* 32.8* 32.0*  MCV 99.1 100.0 97.4 95.2 95.1 95.8  PLT 112* 106* 111* 158 161 132*   Basic Metabolic Panel: Recent Labs  Lab 10/12/23 0440 10/13/23 0508 10/14/23 0515 10/15/23 1930 10/16/23  1212 10/17/23 0438  NA 131* 133* 136 138 138 137  K 3.4* 3.3* 4.0 3.7 4.0 3.4*  CL 99 100 102 101 103 104  CO2 26 29 29 25 24 22   GLUCOSE 74 120* 127* 135* 85 72  BUN <5* <5* <5* <5* <5* <5*  CREATININE 0.59* 0.38* 0.52* 0.67 0.63 0.64  CALCIUM 8.1* 8.2* 8.6* 9.1 8.5* 8.1*  MG 1.6* 1.6* 1.8  --   --   --    GFR: Estimated Creatinine Clearance: 111.2 mL/min (by C-G formula based on SCr of 0.64 mg/dL). Liver Function Tests: Recent Labs  Lab 10/12/23 0440 10/13/23 0508 10/14/23 0515 10/15/23 1930 10/17/23 0438  AST 18 13* 15 16 17   ALT  15 13 12 13 12   ALKPHOS 54 50 58 63 50  BILITOT 1.1 0.5 0.5 0.4 0.7  PROT 5.5* 5.6* 5.8* 6.7 5.6*  ALBUMIN 2.7* 2.6* 2.8* 3.1* 2.5*   Recent Labs  Lab 10/13/23 0508 10/14/23 0515 10/15/23 0502 10/15/23 1930 10/17/23 0438  LIPASE 104* 114* 53* 97* 67*   No results for input(s): "AMMONIA" in the last 168 hours. Coagulation Profile: No results for input(s): "INR", "PROTIME" in the last 168 hours. Cardiac Enzymes: No results for input(s): "CKTOTAL", "CKMB", "CKMBINDEX", "TROPONINI" in the last 168 hours. BNP (last 3 results) No results for input(s): "PROBNP" in the last 8760 hours. HbA1C: No results for input(s): "HGBA1C" in the last 72 hours. CBG: No results for input(s): "GLUCAP" in the last 168 hours. Lipid Profile: No results for input(s): "CHOL", "HDL", "LDLCALC", "TRIG", "CHOLHDL", "LDLDIRECT" in the last 72 hours. Thyroid Function Tests: No results for input(s): "TSH", "T4TOTAL", "FREET4", "T3FREE", "THYROIDAB" in the last 72 hours. Anemia Panel: No results for input(s): "VITAMINB12", "FOLATE", "FERRITIN", "TIBC", "IRON", "RETICCTPCT" in the last 72 hours. Sepsis Labs: Recent Labs  Lab 10/16/23 0221 10/16/23 0617  LATICACIDVEN 2.1* 0.5    No results found for this or any previous visit (from the past 240 hour(s)).       Radiology Studies: No results found.      Scheduled Meds:  dextromethorphan-guaiFENesin  1 tablet Oral BID   enoxaparin (LOVENOX) injection  40 mg Subcutaneous Q24H   feeding supplement  237 mL Oral BID BM   folic acid  1 mg Oral Daily   levETIRAcetam  500 mg Oral BID   lipase/protease/amylase  36,000 Units Oral TID AC   multivitamin with minerals  1 tablet Oral Daily   pantoprazole (PROTONIX) IV  40 mg Intravenous Q24H   sodium chloride flush  3 mL Intravenous Q12H   thiamine  100 mg Oral Daily   Or   thiamine  100 mg Intravenous Daily   Continuous Infusions:  sodium chloride 125 mL/hr at 10/18/23 0222   sodium chloride      meropenem (MERREM) IV 1 g (10/18/23 0620)     LOS: 2 days    Time spent: 35 minutes    Dorcas Carrow, MD [facility]

## 2023-10-18 NOTE — Progress Notes (Signed)
Patient scoring a 7 on CIWA, patient wants pain meds as per his symptoms he would only receive 1 mg Ativan and patient states that 1 mg doesn't help him. Will notify dr. Stann Mainland have oncoming nurse reassess for patients needs for ativan.

## 2023-10-18 NOTE — Progress Notes (Signed)
   10/18/23 1655  Mobility  Activity Ambulated independently in room  Level of Assistance Standby assist, set-up cues, supervision of patient - no hands on  Assistive Device None  Distance Ambulated (ft) 50 ft  Activity Response Tolerated fair  Mobility Referral Yes  Mobility visit 1 Mobility  Mobility Specialist Start Time (ACUTE ONLY) 1640  Mobility Specialist Stop Time (ACUTE ONLY) 1655  Mobility Specialist Time Calculation (min) (ACUTE ONLY) 15 min   Mobility Specialist: Progress Note  Pt agreeable to mobility session - received in bed. Required SB using IV pole, pt with a slow gait. C/o abd pain. Returned to bed with all needs met - call bell within reach.   Barnie Mort, BS Mobility Specialist Please contact via SecureChat or Rehab office at [phone].

## 2023-10-18 NOTE — TOC Initial Note (Addendum)
Transition of Care [facility]) - Initial/Assessment Note    Patient Details  Name: Gary [name] MRN: [id number] Date of Birth: [birth year]  [facility] CM/SW Contact:    Marliss Coots, LCSW Phone Number: 10/18/2023, 12:45 PM  Clinical Narrative:                  9:30AM This CSW introduced herself and role to patient at bedside. CSW inquired about TOC consult (ETOH) and SDOH Museum/gallery exhibitions officer, Housing, Editor, commissioning, Transportation). Patient stated that he resides "outside" and that he "walks" as his method of transportation. Patient expressed interest in applying for disability and obtaining a phone. CSW offered to provide resources that could assist in obtaining a free phone and disability, as well as address SDOH needs. Patient accepted this offered. Patient informed CSW that he is on a waitlist with [facility] for pilot house and planned to call them with room phone.   2:22 PM CSW returned to patient's bedside and provided them with requested resources [facility] [employer] Application/Information, [facility] [employer] Pant[employer] List, Financial controller Information, [employer] information to obtain free phone). CSW offered bus passes for discharge, which patient accepted. CSW inquried about [facility] pilot house. Patient informed CSW that he has yet to call and does not have their phone number. CSW provided patient with [facility] phone number. CSW offered to call family to notify them of admission. Patient declined this offer and stated that he would call brother, whose phone number he knows from memory. CSW emphasized the importance of social supports, which patient expressed understanding of.  Expected Discharge Plan: Homeless Shelter Barriers to Discharge: Homeless with medical needs, Continued Medical Work up   Patient Goals and CMS Choice Patient states their goals for this hospitalization and ongoing recovery are:: to go  home          Expected Discharge Plan and Services In-house Referral: Clinical Social Work     Living arrangements for the past 2 months: Homeless                                      Prior Living Arrangements/Services Living arrangements for the past 2 months: Homeless Lives with:: Self Patient language and need for interpreter reviewed:: Yes Do you feel safe going back to the place where you live?: Yes      Need for Family Participation in Patient Care: Yes (Comment) Care giver support system in place?: Yes (comment)   Criminal Activity/Legal Involvement Pertinent to Current Situation/Hospitalization: No - Comment as needed  Activities of Daily Living   ADL Screening (condition at time of admission) Independently performs ADLs?: Yes (appropriate for developmental age) Is the patient deaf or have difficulty hearing?: No Does the patient have difficulty seeing, even when wearing glasses/contacts?: No Does the patient have difficulty concentrating, remembering, or making decisions?: No  Permission Sought/Granted Permission sought to share information with : Family Supports Permission granted to share information with : Yes, Verbal Permission Granted  Share Information with NAME: Rexford Mizrachi     Permission granted to share info w Relationship: Mother  Permission granted to share info w Contact Information: [phone]  Emotional Assessment Appearance:: Appears stated age Attitude/Demeanor/Rapport: Engaged Affect (typically observed): Accepting, Stable, Appropriate Orientation: : Oriented to Self, Oriented to Place, Oriented to  Time, Oriented to Situation Alcohol / Substance Use: Alcohol Use, Tobacco Use Psych Involvement:  No (comment)  Admission diagnosis:  Pancreatitis [K85.90] Acute on chronic pancreatitis (HCC) [K85.90, K86.1] Acute pancreatitis, unspecified complication status, unspecified pancreatitis type [K85.90] Patient Active Problem List    Diagnosis Date Noted   Pancreatitis 10/16/2023   Acute on chronic pancreatitis (HCC) 10/16/2023   Recurrent acute pancreatitis 10/09/2023   Macrocytic anemia 09/26/2023   Alcoholic fatty liver 09/25/2023   Protein-calorie malnutrition, severe 12/25/2022   Hypomagnesemia 12/24/2022   Alcohol withdrawal syndrome without complication (HCC) 12/23/2022   Seizure disorder (HCC) 12/17/2022   Pancytopenia (HCC) 12/14/2022   Alcohol-induced pancreatitis 12/14/2022   Delirium tremens (HCC) 10/19/2022   Alcohol abuse with alcohol-induced mood disorder (HCC) 10/16/2022   Alcohol dependence with withdrawal (HCC) 10/16/2022   Abnormal LFTs 10/16/2022   Acute alcoholic pancreatitis 09/11/2022   AKI (acute kidney injury) (HCC) 05/28/2022   Homelessness 05/28/2022   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   Metabolic acidosis with increased anion gap and accumulation of organic acids 04/16/2022   Hypokalemia 01/22/2022   Alcohol-induced insomnia (HCC)    MDD (major depressive disorder), recurrent episode, 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/26/2021   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   Alcohol abuse    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   Malingering 05/15/2020   Malnutrition of moderate degree 05/07/2020   Alcoholic ketoacidosis 05/05/2020   Thrombocytopenia (HCC) 07/25/2019   Traumatic subdural hematoma (HCC) 07/02/2019   Alcoholic intoxication with complication (HCC) 07/02/2019   Hypocalcemia 07/02/2019   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   Alcohol use disorder, severe, dependence (HCC) 04/16/2019   Major depressive disorder, 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 [place] [address] Phone: [phone]  Fax: [phone]  [facility]) [phone] - [place], [place] - [address] AT [facility] [address] [place] [address] Phone: [phone] Fax: [phone]  [id number] [place], [place] - [address] AT [facility] OF [address] [place] [address] Phone: [phone] Fax: [phone]     Social Determinants of Health (SDOH) Social History: SDOH Screenings   Food Insecurity: Patient Declined (10/16/2023)  Recent Concern: Food Insecurity - Food Insecurity Present (10/10/2023)  Housing: Patient Declined (10/10/2023)  Recent Concern: Housing - High Risk (10/03/2023)  Transportation Needs: Unmet Transportation Needs (10/16/2023)  Utilities: Patient Declined (10/16/2023)  Alcohol Screen: High Risk (07/25/2021)  Depression (PHQ2-9): Medium Risk (02/12/2021)  Financial Resource Strain: Patient Declined (10/11/2023)  Social Connections: Unknown (06/24/2022)   Received from [facility], [facility]  Stress: No Stress Concern Present (06/30/2022)   Received from [facility], [facility]  Recent Concern: Stress - Stress Concern Present (06/25/2022)   Received from [facility]  Tobacco Use: High Risk (10/17/2023)   SDOH Interventions:     Readmission Risk Interventions    10/11/2023   12:26 PM 10/03/2023   11:23 AM 09/30/2023    3:35 PM  Readmission Risk Prevention Plan  Transportation Screening Complete Complete Complete  Medication Review (RN Care Manager) Referral to Pharmacy Complete Complete  PCP or Specialist appointment within 3-5 days of discharge Patient refused Patient refused Patient refused  HRI or Home Care Consult Patient refused Complete Complete  SW Recovery Care/Counseling Consult Complete Patient refused Complete  Palliative Care Screening Not Applicable Not Applicable  Not Applicable  Skilled Nursing Facility Not Applicable Not Applicable Not Applicable

## 2023-10-18 NOTE — Plan of Care (Signed)
  Problem: Clinical Measurements: Goal: Will remain free from infection Outcome: Progressing Goal: Diagnostic test results will improve Outcome: Progressing Goal: Respiratory complications will improve Outcome: Progressing Goal: Cardiovascular complication will be avoided Outcome: Progressing   Problem: Activity: Goal: Risk for activity intolerance will decrease Outcome: Progressing   Problem: Nutrition: Goal: Adequate nutrition will be maintained Outcome: Progressing   Problem: Elimination: Goal: Will not experience complications related to bowel motility Outcome: Progressing Goal: Will not experience complications related to urinary retention Outcome: Progressing   Problem: Safety: Goal: Ability to remain free from injury will improve Outcome: Progressing   Problem: Skin Integrity: Goal: Risk for impaired skin integrity will decrease Outcome: Progressing

## 2023-10-19 DIAGNOSIS — K8522 Alcohol induced acute pancreatitis with infected necrosis: Secondary | ICD-10-CM | POA: Diagnosis not present

## 2023-10-19 LAB — CBC WITH DIFFERENTIAL/PLATELET
Abs Immature Granulocytes: 0 10*3/uL (ref 0.00–0.07)
Basophils Absolute: 0 10*3/uL (ref 0.0–0.1)
Basophils Relative: 1 %
Eosinophils Absolute: 0.1 10*3/uL (ref 0.0–0.5)
Eosinophils Relative: 2 %
HCT: 32.1 % — ABNORMAL LOW (ref 39.0–52.0)
Hemoglobin: 10.9 g/dL — ABNORMAL LOW (ref 13.0–17.0)
Lymphocytes Relative: 14 %
Lymphs Abs: 0.4 10*3/uL — ABNORMAL LOW (ref 0.7–4.0)
MCH: 32.1 pg (ref 26.0–34.0)
MCHC: 34 g/dL (ref 30.0–36.0)
MCV: 94.4 fL (ref 80.0–100.0)
Monocytes Absolute: 0.1 10*3/uL (ref 0.1–1.0)
Monocytes Relative: 3 %
Neutro Abs: 2.3 10*3/uL (ref 1.7–7.7)
Neutrophils Relative %: 80 %
Platelets: 208 10*3/uL (ref 150–400)
RBC: 3.4 MIL/uL — ABNORMAL LOW (ref 4.22–5.81)
RDW: 14.5 % (ref 11.5–15.5)
WBC: 2.9 10*3/uL — ABNORMAL LOW (ref 4.0–10.5)
nRBC: 0 % (ref 0.0–0.2)
nRBC: 0 /100{WBCs}

## 2023-10-19 LAB — COMPREHENSIVE METABOLIC PANEL
ALT: 11 U/L (ref 0–44)
AST: 13 U/L — ABNORMAL LOW (ref 15–41)
Albumin: 2.4 g/dL — ABNORMAL LOW (ref 3.5–5.0)
Alkaline Phosphatase: 45 U/L (ref 38–126)
Anion gap: 7 (ref 5–15)
BUN: <5 mg/dL — ABNORMAL LOW (ref 6–20)
CO2: 27 mmol/L (ref 22–32)
Calcium: 8.6 mg/dL — ABNORMAL LOW (ref 8.9–10.3)
Chloride: 104 mmol/L (ref 98–111)
Creatinine, Ser: 0.47 mg/dL — ABNORMAL LOW (ref 0.61–1.24)
GFR, Estimated: >60 mL/min (ref >60)
Glucose, Bld: 108 mg/dL — ABNORMAL HIGH (ref 70–99)
Potassium: 3.7 mmol/L (ref 3.5–5.1)
Sodium: 138 mmol/L (ref 135–145)
Total Bilirubin: 0.3 mg/dL (ref <1.2)
Total Protein: 5.5 g/dL — ABNORMAL LOW (ref 6.5–8.1)

## 2023-10-19 LAB — LIPASE, BLOOD: Lipase: 103 U/L — ABNORMAL HIGH (ref 11–51)

## 2023-10-19 LAB — MAGNESIUM: Magnesium: 1.7 mg/dL (ref 1.7–2.4)

## 2023-10-19 LAB — PHOSPHORUS: Phosphorus: 4.6 mg/dL (ref 2.5–4.6)

## 2023-10-19 MED ORDER — MAGNESIUM SULFATE 2 GM/50ML IV SOLN
2.0000 g | Freq: Once | INTRAVENOUS | Status: AC
  Administered 2023-10-19: 2 g via INTRAVENOUS
  Filled 2023-10-19: qty 50

## 2023-10-19 MED ORDER — HYDROMORPHONE HCL 1 MG/ML IJ SOLN
0.5000 mg | Freq: Three times a day (TID) | INTRAMUSCULAR | Status: DC | PRN
  Administered 2023-10-19 – 2023-10-20 (×2): 0.5 mg via SUBCUTANEOUS
  Filled 2023-10-19 (×2): qty 0.5

## 2023-10-19 MED ORDER — LORAZEPAM 2 MG/ML IJ SOLN
1.0000 mg | INTRAMUSCULAR | Status: DC | PRN
  Administered 2023-10-19: 2 mg via INTRAVENOUS
  Administered 2023-10-20: 1 mg via INTRAVENOUS
  Filled 2023-10-19 (×2): qty 1

## 2023-10-19 MED ORDER — LORAZEPAM 1 MG PO TABS: 1.0000 mg | ORAL_TABLET | ORAL | Status: DC | PRN

## 2023-10-19 MED ORDER — PANTOPRAZOLE SODIUM 40 MG PO TBEC
40.0000 mg | DELAYED_RELEASE_TABLET | Freq: Every day | ORAL | Status: DC
  Administered 2023-10-20: 40 mg via ORAL
  Filled 2023-10-19: qty 1

## 2023-10-19 MED ORDER — OXYCODONE HCL 5 MG PO TABS
10.0000 mg | ORAL_TABLET | ORAL | Status: DC | PRN
  Administered 2023-10-19 – 2023-10-20 (×4): 10 mg via ORAL
  Filled 2023-10-19 (×4): qty 2

## 2023-10-19 MED ORDER — ALPRAZOLAM 0.5 MG PO TABS
0.5000 mg | ORAL_TABLET | Freq: Three times a day (TID) | ORAL | Status: DC | PRN
  Administered 2023-10-19 – 2023-10-20 (×3): 0.5 mg via ORAL
  Filled 2023-10-19 (×3): qty 1

## 2023-10-19 NOTE — Progress Notes (Signed)
  Patient's nurse reported that patient CIWA score is 13.  As needed Ativan has been discontinued.  Per chart review no evidence of alcohol withdrawal syndrome in the daytime and patient has been placed on low-dose Xanax for anxiety. -Given patient is showing significant alcohol withdrawal symptoms with CIWA 13 score reinitiating CIWA with Ativan as needed.

## 2023-10-19 NOTE — Progress Notes (Signed)
Pt not satisfied with pain regime. Pt doesn't think his PRN doses are strong enough. Pt states "he wants to leave and drink a L of vodka." Primary RN advised him of alternate ways to decrease pain and give him what he is ordered per the MD. MD aware.

## 2023-10-19 NOTE — Progress Notes (Signed)
PROGRESS NOTE    Gary [name]  ZOX:[id number] DOB: [birth year] DOA: 10/15/2023 PCP: Default, Provider, MD    Brief Narrative:  37 year old with ongoing alcohol use, recurrent pancreatitis, HIV, seizures, mesenteric thrombus, discharged from hospital,  went out of the hospital, drank Vodka and came back to the ER with worsening pain and unable to keep anything down.  He was discharged in the afternoon, went to downtown had some drinks and came back to the ER.  CT scan with severely necrotic pancreas.  Subjective:  Patient seen in the morning rounds.  He has been asking higher doses of Dilaudid and Ativan in the morning.  He also insisted on eating food yesterday. Detailed discussion with the patient that if he is still needing injectable pain medications, we will keep him n.p.o., insert postpyloric feeding tube and start him on tube feeding.  Patient then realized that he may manage his pain with oral medications and continue to eat. Will advance diet.  Keep on oral pain medications and monitor.  If significant pain, will scale back to n.p.o. and put postpyloric feeding tube. Complained of alcohol withdrawal and was asking for Ativan.  He looks fairly comfortable.  We discussed about using oral Xanax and he is agreed.   Assessment & Plan:   Acute recurrent alcoholic pancreatitis, ongoing use of alcohol.  Necrosis of the pancreas. Patient was given multiple doses of IV fluids.  Continue maintenance IV fluids. Clinical improvement on objective findings. Discontinue IV pain medications.  Challenge with soft diet.  Continue oral pain medications and nausea medications. Initially no role of antibiotics, will discontinue meropenem. GI following. Pancreatic enzyme supplements.  Alcohol use disorder with dependence: Risk of withdrawal however he was in the hospital for more than 5 days.  Multivitamins.   No evidence of alcohol withdrawal syndrome.  Use low-dose Xanax for anxiety.  HIV: On  Biktarvy.  Not sure he is taking.  Compliance is major issue.  He will resume that on discharge.  Seizure disorder: On Keppra.  Protein-calorie malnutrition, high risk of protein calorie malnutrition and decompensation.  Monitor electrolytes closely.  If patient does not eat adequate, will start tube feeding.  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  Not a candidate for anticoagulation given ongoing alcohol use and noncompliance.  Hypokalemia: Replaced and adequate.   DVT prophylaxis: enoxaparin (LOVENOX) injection 40 mg Start: 10/16/23 1200   Code Status: Full code Family Communication: None at bedside Disposition Plan: Status is: Inpatient Remains inpatient appropriate because: Significant pain.  Intolerance to diet.     Consultants:  GI  Procedures:  None  Antimicrobials:  Meropenem 12/7--- 12/10     Objective: Vitals:   10/19/23 0050 10/19/23 0429 10/19/23 0821 10/19/23 0839  BP: 92/63 (!) 88/55 (!) 84/52 95/69  Pulse: 70 83 (!) 56   Resp: 18 18 18    Temp:  98.3 F (36.8 C) 98 F (36.7 C)   TempSrc:  Oral    SpO2:  95% 92%   Weight:      Height:        Intake/Output Summary (Last 24 hours) at 10/19/2023 1152 Last data filed at 10/19/2023 0508 Gross per 24 hour  Intake 2757.34 ml  Output 3225 ml  Net -467.66 ml   Filed Weights   10/16/23 0947  Weight: 62.2 kg    Examination:  General: Chronically ill looking.  Interactive today.  Not in any distress. Cardiovascular: S1-S2 normal.  Regular rate rhythm. Respiratory: Bilateral clear.  No added sounds.  Gastrointestinal: Soft but diffusely tender.  Uncooperative to exam.  Bowel sound present. Ext: No swelling or edema.     Data Reviewed: I have personally reviewed following labs and imaging studies  CBC: Recent Labs  Lab 10/14/23 0515 10/15/23 1930 10/16/23 1212 10/17/23 0438 10/19/23 0455  WBC 2.8* 5.0 3.3* 2.6* 2.9*  NEUTROABS 1.6*  --   --   --  2.3  HGB 11.5* 11.4* 11.0* 10.6*  10.9*  HCT 33.7* 33.8* 32.8* 32.0* 32.1*  MCV 97.4 95.2 95.1 95.8 94.4  PLT 111* 158 161 132* 208   Basic Metabolic Panel: Recent Labs  Lab 10/13/23 0508 10/14/23 0515 10/15/23 1930 10/16/23 1212 10/17/23 0438 10/19/23 0455  NA 133* 136 138 138 137 138  K 3.3* 4.0 3.7 4.0 3.4* 3.7  CL 100 102 101 103 104 104  CO2 29 29 25 24 22 27   GLUCOSE 120* 127* 135* 85 72 108*  BUN <5* <5* <5* <5* <5* <5*  CREATININE 0.38* 0.52* 0.67 0.63 0.64 0.47*  CALCIUM 8.2* 8.6* 9.1 8.5* 8.1* 8.6*  MG 1.6* 1.8  --   --   --  1.7  PHOS  --   --   --   --   --  4.6   GFR: Estimated Creatinine Clearance: 111.2 mL/min (A) (by C-G formula based on SCr of 0.47 mg/dL (L)). Liver Function Tests: Recent Labs  Lab 10/13/23 0508 10/14/23 0515 10/15/23 1930 10/17/23 0438 10/19/23 0455  AST 13* 15 16 17  13*  ALT 13 12 13 12 11   ALKPHOS 50 58 63 50 45  BILITOT 0.5 0.5 0.4 0.7 0.3  PROT 5.6* 5.8* 6.7 5.6* 5.5*  ALBUMIN 2.6* 2.8* 3.1* 2.5* 2.4*   Recent Labs  Lab 10/14/23 0515 10/15/23 0502 10/15/23 1930 10/17/23 0438 10/19/23 0455  LIPASE 114* 53* 97* 67* 103*   No results for input(s): "AMMONIA" in the last 168 hours. Coagulation Profile: No results for input(s): "INR", "PROTIME" in the last 168 hours. Cardiac Enzymes: No results for input(s): "CKTOTAL", "CKMB", "CKMBINDEX", "TROPONINI" in the last 168 hours. BNP (last 3 results) No results for input(s): "PROBNP" in the last 8760 hours. HbA1C: No results for input(s): "HGBA1C" in the last 72 hours. CBG: No results for input(s): "GLUCAP" in the last 168 hours. Lipid Profile: No results for input(s): "CHOL", "HDL", "LDLCALC", "TRIG", "CHOLHDL", "LDLDIRECT" in the last 72 hours. Thyroid Function Tests: No results for input(s): "TSH", "T4TOTAL", "FREET4", "T3FREE", "THYROIDAB" in the last 72 hours. Anemia Panel: No results for input(s): "VITAMINB12", "FOLATE", "FERRITIN", "TIBC", "IRON", "RETICCTPCT" in the last 72 hours. Sepsis  Labs: Recent Labs  Lab 10/16/23 0221 10/16/23 0617  LATICACIDVEN 2.1* 0.5    No results found for this or any previous visit (from the past 240 hour(s)).       Radiology Studies: No results found.      Scheduled Meds:  dextromethorphan-guaiFENesin  1 tablet Oral BID   enoxaparin (LOVENOX) injection  40 mg Subcutaneous Q24H   feeding supplement  237 mL Oral BID BM   folic acid  1 mg Oral Daily   levETIRAcetam  500 mg Oral BID   lipase/protease/amylase  36,000 Units Oral TID AC   multivitamin with minerals  1 tablet Oral Daily   pantoprazole (PROTONIX) IV  40 mg Intravenous Q24H   sodium chloride flush  3 mL Intravenous Q12H   thiamine  100 mg Oral Daily   Or   thiamine  100 mg Intravenous Daily   Continuous Infusions:  sodium chloride 125 mL/hr at 10/19/23 0505   magnesium sulfate bolus IVPB       LOS: 3 days    Time spent: 35 minutes    Dorcas Carrow, MD [facility]

## 2023-10-19 NOTE — Progress Notes (Signed)
PHARMACIST - PHYSICIAN COMMUNICATION  DR:   Jerral Ralph  CONCERNING: IV to Oral Route Change Policy  RECOMMENDATION: This patient is receiving pantoprazole by the intravenous route.  Based on criteria approved by the Pharmacy and Therapeutics Committee, the intravenous medication(s) is/are being converted to the equivalent oral dose form(s).   DESCRIPTION: These criteria include: The patient is eating (either orally or via tube) and/or has been taking other orally administered medications for a least 24 hours The patient has no evidence of active gastrointestinal bleeding or impaired GI absorption (gastrectomy, short bowel, patient on TNA or NPO).  If you have questions about this conversion, please contact the Pharmacy Department  []   [phone] )  [facility] []   [phone] )  [facility] [x]   [phone] )  [name] []   [phone] )  [facility] []   [phone] )  [facility]   Rexford Maus, PharmD, BCPS 10/19/2023 1:36 PM

## 2023-10-20 ENCOUNTER — Other Ambulatory Visit (HOSPITAL_COMMUNITY): Payer: Self-pay

## 2023-10-20 DIAGNOSIS — K8521 Alcohol induced acute pancreatitis with uninfected necrosis: Secondary | ICD-10-CM

## 2023-10-20 MED ORDER — PANTOPRAZOLE SODIUM 40 MG PO TBEC
40.0000 mg | DELAYED_RELEASE_TABLET | Freq: Every day | ORAL | 0 refills | Status: DC
  Filled 2023-10-20: qty 30, 30d supply, fill #0

## 2023-10-20 MED ORDER — LEVETIRACETAM 500 MG PO TABS: 500.0000 mg | ORAL_TABLET | Freq: Two times a day (BID) | ORAL | 0 refills | Status: DC

## 2023-10-20 MED ORDER — PANCRELIPASE (LIP-PROT-AMYL) 36000-114000 UNITS PO CPEP
36000.0000 [IU] | ORAL_CAPSULE | Freq: Three times a day (TID) | ORAL | 0 refills | Status: DC
  Filled 2023-10-20: qty 200, 67d supply, fill #0

## 2023-10-20 MED ORDER — THIAMINE HCL 100 MG PO TABS
100.0000 mg | ORAL_TABLET | Freq: Every day | ORAL | 0 refills | Status: DC
  Filled 2023-10-20: qty 30, 30d supply, fill #0

## 2023-10-20 MED ORDER — OXYCODONE HCL 10 MG PO TABS
10.0000 mg | ORAL_TABLET | ORAL | 0 refills | Status: AC | PRN
  Filled 2023-10-20: qty 20, 4d supply, fill #0

## 2023-10-20 NOTE — Plan of Care (Signed)
  Problem: Education: Goal: Knowledge of General Education information will improve Description: Including pain rating scale, medication(s)/side effects and non-pharmacologic comfort measures Outcome: Adequate for Discharge   Problem: Health Behavior/Discharge Planning: Goal: Ability to manage health-related needs will improve Outcome: Adequate for Discharge   Problem: Clinical Measurements: Goal: Ability to maintain clinical measurements within normal limits will improve Outcome: Adequate for Discharge Goal: Will remain free from infection Outcome: Adequate for Discharge Goal: Diagnostic test results will improve Outcome: Adequate for Discharge Goal: Respiratory complications will improve Outcome: Adequate for Discharge Goal: Cardiovascular complication will be avoided Outcome: Adequate for Discharge   Problem: Nutrition: Goal: Adequate nutrition will be maintained Outcome: Adequate for Discharge   Problem: Activity: Goal: Risk for activity intolerance will decrease Outcome: Adequate for Discharge

## 2023-10-20 NOTE — Discharge Summary (Signed)
Physician Discharge Summary  Gary [name]:[id number] DOB: [birth year] DOA: 10/15/2023  PCP: Default, Provider, MD  Admit date: 10/15/2023 Discharge date: 10/20/2023  Admitted From: Homeless  Disposition: Home, he is homeless.  Recommendations for Outpatient Follow-up:  Follow up with PCP in 1-2 weeks  Discharge Condition: Stable CODE STATUS: Full code Diet recommendation: Soft and liquid diet  Discharge summary: Patient is being discharged today in stable condition.  He is high risk of readmission due to homelessness and alcoholism.  He will likely come back to the hospital soon. Lot of narcotic seeking behavior. Patient will ask for IV narcotics and he will order cheeseburger to eat.   37 year old with ongoing alcohol use, recurrent pancreatitis, HIV, seizures, mesenteric thrombus, discharged from hospital,  went out of the hospital, drank Vodka and came back to the ER with worsening pain and unable to keep anything down.  He was discharged in the afternoon, went to downtown had some drinks and came back to the ER.  CT scan with necrotic pancreas.  Patient was treated with IV fluids, initially with IV antibiotics then discontinued.  He received adequate oral and IV opiates for pain relief.  Today he is tolerating regular diet, occasional use of oral pain medications and willing to go home.  Patient was discharged with short course of pain medications, nausea medications.  Advised to stay on liquid diet and soft food.  Advised to avoid fatty food, advised to avoid alcohol.  Patient does have history of HIV.  Was on Biktarvy and last dose was 2 months ago.  Patient was advised to keep follow-up with ID clinic. Advised to continue multivitamins.  Electrolytes are adequate.  Pancrelipase was prescribed.  Stable for discharge.   Discharge Diagnoses:  Principal Problem:   Acute alcoholic pancreatitis Active Problems:   Polysubstance abuse (HCC)   Homelessness   Alcohol use  disorder, severe, dependence (HCC)   HIV (human immunodeficiency virus infection) (HCC)   Seizure disorder (HCC)   Protein-calorie malnutrition, severe   Acute on chronic pancreatitis [facility])    Discharge Instructions  Discharge Instructions     Diet general   Complete by: As directed    Liquid and soft diet   Increase activity slowly   Complete by: As directed       Allergies as of 10/20/2023       Reactions   Tegretol [carbamazepine] Other (See Comments)   Caused vertigo for 2 days after taking it   Caffeine Palpitations        Medication List     TAKE these medications    levETIRAcetam 500 MG tablet Commonly known as: KEPPRA Take 1 tablet (500 mg total) by mouth 2 (two) times daily.   lipase/protease/amylase 95621 UNITS Cpep capsule Commonly known as: CREON Take 1 capsule (36,000 Units total) by mouth 3 (three) times daily before meals.   Oxycodone HCl 10 MG Tabs Take 1 tablet (10 mg total) by mouth every 4 (four) hours as needed for up to 5 days for moderate pain (pain score 4-6) or severe pain (pain score 7-10).   pantoprazole 40 MG tablet Commonly known as: Protonix Take 1 tablet (40 mg total) by mouth daily.   thiamine 100 MG tablet Commonly known as: VITAMIN B1 Take 1 tablet (100 mg total) by mouth daily. 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 EXAM: CTA ABDOMEN AND PELVIS WITHOUT AND WITH CONTRAST TECHNIQUE: Multidetector CT imaging of the abdomen and pelvis was performed using the standard protocol during bolus administration of intravenous contrast. Multiplanar reconstructed  images and MIPs were obtained and reviewed to evaluate the vascular anatomy. RADIATION DOSE REDUCTION: This exam was performed according to the departmental dose-optimization program which includes automated exposure control, adjustment of the mA and/or kV according to patient size and/or use of iterative reconstruction technique. CONTRAST:  75mL OMNIPAQUE IOHEXOL 350 MG/ML SOLN COMPARISON:  CT Abdomen and Pelvis 10/10/2023 and earlier. FINDINGS: VASCULAR Initial noncontrast images demonstrate minimal left iliac artery calcified plaque and normal caliber of the abdominal aorta. Following contrast the abdominal aorta is patent and normal. The major aortic branches are patent including the celiac and S in the. There is mild ectasia of the left Common iliac artery. Bilateral iliac and proximal femoral arteries otherwise appear patent and normal. On the delayed portal venous phase images there is mass effect and moderate to severe attenuation of the confluence of the splenic vein, SMV, and main portal vein on series 14, image 28. But those structures remain patent. Tiny nonocclusive SMV thrombus redemonstrated on series 14, image 32, but elsewhere the major mesenteric venous structures appear to be patent. Review of the MIP images confirms the above findings. NON-VASCULAR Lower chest: Heart size remains normal. No pericardial effusion. Elevated left hemidiaphragm is chronic. Trace layering pleural effusions are more apparent. Chronic but increased left lower lobe atelectasis. Mild right lung base atelectasis. Hepatobiliary: Liver enhancement is maintained with evidence of hepatic steatosis. Gallbladder appears negative. No significant bile duct enlargement. Pancreas: Severe pancreatitis redemonstrated. Phlegmon like heterogeneous enlargement of the pancreatic head, uncinate, proximal body. Areas of heterogeneous pancreatic necrosis and progressive cystic spaces within the affected parenchyma individually up to 14-17 mm  (series 14, image 31) compatible with developing pseudocysts. Inflammation tracks into the root of the small bowel mesentery. Comparatively small volume of regional free fluid. Secondary inflammation of the stomach and duodenum. Spleen: Negative. Adrenals/Urinary Tract: Negative. Symmetric renal enhancement and contrast excretion. Excreted contrast in the urinary bladder on the 0500 hour repeat images. Stomach/Bowel: Oral contrast mixed with stool in the descending colon. Redundant splenic flexure. Decompressed transverse colon. Normal contrast containing appendix. Indistinct appearance of abdominal small bowel loops, although less dilated since 10/10/2023. Inflammation emitting from the lesser sac. No pneumoperitoneum identified. Lymphatic: No lymphadenopathy identified. Reproductive: Stable. Other: Small volume of free fluid in the pelvis with simple fluid density. Musculoskeletal: No acute osseous abnormality identified. Not present at 0218 hours, but present on repeat images at 0522 hours is patchy contrast and/or enhancement in the left lower lateral chest wall from the lower pectoralis muscle tracking laterally on series 12, image 1. This persists on the delayed portal venous phase images. Underlying ribs appear intact. Furthermore, there is contrast extravasation into the adjacent left upper extremity visible there. These are poorly visible on the scout view. However, on series 21 coronal images the upper extremity extravasation is visible with associated soft tissue edema. IMPRESSION: 1. Ongoing Severe Pancreatitis. Heterogeneous pancreatic phlegmon, necrosis with developing small pseudocysts in the head, uncinate, and proximal body (currently up to 1.7 cm). 2. Regional inflammation, with mass effect on the confluence of the major portal venous  structures. The portal venous system does remain patent, although there is again evidence of a small nonocclusive thrombus there at the SMV. 3. Significant Contrast  Extravasation into the Left upper extremity, and also visible at the left lateral chest wall (contrast probably migrated into the chest from a ruptured regional vein such as in near the axilla). Recommend cold compresses, elevation of the left extremity, physical exam checks, and plastic surgery consultation if any skin changes are observed. 4. Small volume of free fluid in the abdomen and pelvis, trace pleural effusions. Lung base atelectasis. Electronically Signed   By: Odessa Fleming M.D.   On: 10/16/2023 06:45   CT ABDOMEN PELVIS W CONTRAST  Result Date: 10/10/2023 CLINICAL DATA:  Pancreatitis suspected EXAM: CT ABDOMEN AND PELVIS WITH CONTRAST TECHNIQUE: Multidetector CT imaging of the abdomen and pelvis was performed using the standard protocol following bolus administration of intravenous contrast. RADIATION DOSE REDUCTION: This exam was performed according to the departmental dose-optimization program which includes automated exposure control, adjustment of the mA and/or kV according to patient size and/or use of iterative reconstruction technique. CONTRAST:  OMNIPAQUE IOHEXOL 300 MG/ML  SOLN COMPARISON:  CT abdomen and pelvis 09/25/2023 FINDINGS: Lower chest: No acute abnormality. Hepatobiliary: There is diffuse fatty infiltration of the liver. No focal liver lesions are seen. The gallbladder is distended. No gallstones are identified. There is no biliary ductal dilatation. Pancreas: There is fat stranding and fluid surrounding the pancreas, predominantly of the head of the pancreas, which has increased from prior. There is ill-defined hypodensity in the pancreatic head measuring 1.9 by 0.6 cm similar to prior. No pancreatic ductal dilatation. Spleen: Mildly enlarged, unchanged. Adrenals/Urinary Tract: Adrenal glands are unremarkable. Kidneys are normal, without renal calculi, focal lesion, or hydronephrosis. Bladder is unremarkable. Stomach/Bowel: There are focal areas of wall thickening inflammation of  the proximal transverse colon and hepatic flexure. Appendix is within normal limits. There are dilated small bowel loops in the left abdomen measuring up to 4.8 cm. No definitive transition point visualized, but there are decompressed distal small bowel loops. Stomach is distended with contrast and air. There is wall thickening of the duodenal as it approximates the pancreatic head. Vascular/Lymphatic: Aorta and IVC are normal in size. 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 The portal vein appears small in size, a new finding. Compression or thrombus not excluded at this level. Right and left portal veins appear patent. Splenic vein is patent. 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 No enlarged lymph nodes are seen. Reproductive: Prostate gland is enlarged. Other: There is a new small amount of ascites throughout the abdomen and pelvis. Musculoskeletal: No acute or significant osseous findings. IMPRESSION: 1. Findings compatible with acute pancreatitis. Fluid and inflammation has increased compared to prior. 2. There is ill-defined hypodensity in the pancreatic head which may represent a small pseudocyst, but underlying mass is not excluded. Recommend follow-up MRI after resolution of acute symptoms. 3. New small amount of ascites throughout the abdomen and pelvis. 4. Dilated small bowel loops in the left abdomen measuring up to 4.8 cm. No definitive transition point visualized, but there are decompressed distal small bowel loops. Findings may represent ileus or partial small bowel obstruction. 5. Questionable nonocclusive superior mesenteric vein thrombus. 6. The portal vein appears small in size, a new finding. Compression or thrombus not excluded at this level. 7. Focal areas of wall thickening and inflammation of the proximal transverse colon and hepatic flexure. Findings may be reactive, but colitis is not excluded.  8. Fatty infiltration of the liver. 9. Splenomegaly.  Electronically Signed   By: Darliss Cheney M.D.   On: 10/10/2023 19:48   DG ABD ACUTE 2+V W 1V CHEST  Result Date: 09/30/2023 CLINICAL DATA:  Abdominal pain. EXAM: DG ABDOMEN ACUTE WITH 1 VIEW CHEST COMPARISON:  Radiographs earlier today, CT 09/25/2023 FINDINGS: Unchanged elevation of left hemidiaphragm. Left lung base atelectasis/scarring. No acute airspace disease. Normal heart size. No evidence of bowel obstruction or free air. Stable air throughout small bowel centrally, unchanged from earlier today. Small to moderate volume of stool in the colon. No acute osseous abnormalities are seen. IMPRESSION: 1. No bowel obstruction or free air. Unchanged bowel-gas pattern from earlier today. 2. Unchanged elevation of left hemidiaphragm with left lung base atelectasis/scarring. Electronically Signed   By: Narda Rutherford M.D.   On: 09/30/2023 22:37   DG Abd Portable 1V  Result Date: 09/30/2023 CLINICAL DATA:  Abdominal pain EXAM: PORTABLE ABDOMEN - 1 VIEW COMPARISON:  CT 09/25/23. FINDINGS: Gas seen in nondilated loops of small and large bowel. Scattered colonic stool. Air in the rectum. No obstruction. There is some motion on this portable supine radiograph. The diaphragm is clipped off the edge of the film. Overlapping cardiac leads. IMPRESSION: Nonspecific bowel gas pattern. Please correlate with the prior CT findings of pancreatitis Electronically Signed   By: Karen Kays M.D.   On: 09/30/2023 14:13   CT ABDOMEN PELVIS W CONTRAST  Result Date: 09/25/2023 CLINICAL DATA:  Epigastric pain EXAM: CT ABDOMEN AND PELVIS WITH CONTRAST TECHNIQUE: Multidetector CT imaging of the abdomen and pelvis was performed using the standard protocol following bolus administration of intravenous contrast. RADIATION DOSE REDUCTION: This exam was performed according to the departmental dose-optimization program which includes automated exposure control, adjustment of the mA and/or kV according to patient size and/or use of  iterative reconstruction technique. CONTRAST:  OMNIPAQUE IOHEXOL 300 MG/ML  SOLN COMPARISON:  09/16/2023 FINDINGS: Lower chest: Elevation of the left hemidiaphragm, stable. No acute findings. Hepatobiliary: Diffuse low-density throughout the liver compatible with fatty infiltration. No focal abnormality. Gallbladder unremarkable. Common bile duct prominent at 8 mm, similar to prior study. Pancreas: Truncation of the pancreatic tail. Enlargement of the pancreatic head with surrounding peripancreatic edema. Findings are similar to prior study. Spleen: No focal abnormality.  Normal size. Adrenals/Urinary Tract: No adrenal abnormality. No focal renal abnormality. No stones or hydronephrosis. Urinary bladder is unremarkable. Stomach/Bowel: Normal appendix. Stomach, large and small bowel grossly unremarkable. Vascular/Lymphatic: No evidence of aneurysm or adenopathy. Reproductive: No visible focal abnormality. Other: No free fluid or free air. Musculoskeletal: No acute bony abnormality IMPRESSION: Continued enlargement of the pancreatic head with surrounding inflammation compatible with acute pancreatitis Hepatic steatosis. Electronically Signed   By: Charlett Nose M.D.   On: 09/25/2023 02:34   (Echo, Carotid, EGD, Colonoscopy, ERCP)    Subjective: Patient seen and examined.  Overnight events noted.  He complained of anxiety and was given Ativan.  On my interview, patient is calm and comfortable.  He does complain of having pain especially after eating and he has been ordering cheeseburger.  Discussed about pain management and currently managed with oral pain medication and he was comfortable with plan to go home.   Discharge Exam: Vitals:   10/20/23 0332 10/20/23 0829  BP: 100/71 97/63  Pulse: 64 60  Resp: 17 18  Temp: 97.8 F (36.6 C) 98 F (36.7 C)  SpO2: 95% 96%   Vitals:   10/19/23 1616 10/19/23 2127 10/20/23 3500  10/20/23 0829  BP: 105/73 104/79 100/71 97/63  Pulse: 77 64 64 60  Resp:  17  17 18   Temp: 98 F (36.7 C)  97.8 F (36.6 C) 98 F (36.7 C)  TempSrc:   Axillary   SpO2: 98% 96% 95% 96%  Weight:      Height:        General: Pt is alert, awake, not in acute distress.  Pleasant and interactive. Cardiovascular: RRR, S1/S2 +, no rubs, no gallops Respiratory: CTA bilaterally, no wheezing, no rhonchi Abdominal: Soft, subjective tenderness with some voluntary guarding.  Soft.  ND, bowel sounds + Extremities: no edema, no cyanosis    The results of significant diagnostics from this hospitalization (including imaging, microbiology, ancillary and laboratory) are listed below for reference.     Microbiology: No results found for this or any previous visit (from the past 240 hour(s)).   Labs: BNP (last 3 results) No results for input(s): "BNP" in the last 8760 hours. Basic Metabolic Panel: Recent Labs  Lab 10/14/23 0515 10/15/23 1930 10/16/23 1212 10/17/23 0438 10/19/23 0455  NA 136 138 138 137 138  K 4.0 3.7 4.0 3.4* 3.7  CL 102 101 103 104 104  CO2 29 25 24 22 27   GLUCOSE 127* 135* 85 72 108*  BUN <5* <5* <5* <5* <5*  CREATININE 0.52* 0.67 0.63 0.64 0.47*  CALCIUM 8.6* 9.1 8.5* 8.1* 8.6*  MG 1.8  --   --   --  1.7  PHOS  --   --   --   --  4.6   Liver Function Tests: Recent Labs  Lab 10/14/23 0515 10/15/23 1930 10/17/23 0438 10/19/23 0455  AST 15 16 17  13*  ALT 12 13 12 11   ALKPHOS 58 63 50 45  BILITOT 0.5 0.4 0.7 0.3  PROT 5.8* 6.7 5.6* 5.5*  ALBUMIN 2.8* 3.1* 2.5* 2.4*   Recent Labs  Lab 10/14/23 0515 10/15/23 0502 10/15/23 1930 10/17/23 0438 10/19/23 0455  LIPASE 114* 53* 97* 67* 103*   No results for input(s): "AMMONIA" in the last 168 hours. CBC: Recent Labs  Lab 10/14/23 0515 10/15/23 1930 10/16/23 1212 10/17/23 0438 10/19/23 0455  WBC 2.8* 5.0 3.3* 2.6* 2.9*  NEUTROABS 1.6*  --   --   --  2.3  HGB 11.5* 11.4* 11.0* 10.6* 10.9*  HCT 33.7* 33.8* 32.8* 32.0* 32.1*  MCV 97.4 95.2 95.1 95.8 94.4  PLT 111* 158 161 132*  208   Cardiac Enzymes: No results for input(s): "CKTOTAL", "CKMB", "CKMBINDEX", "TROPONINI" in the last 168 hours. BNP: Invalid input(s): "POCBNP" CBG: No results for input(s): "GLUCAP" in the last 168 hours. D-Dimer No results for input(s): "DDIMER" in the last 72 hours. Hgb A1c No results for input(s): "HGBA1C" in the last 72 hours. Lipid Profile No results for input(s): "CHOL", "HDL", "LDLCALC", "TRIG", "CHOLHDL", "LDLDIRECT" in the last 72 hours. Thyroid function studies No results for input(s): "TSH", "T4TOTAL", "T3FREE", "THYROIDAB" in the last 72 hours.  Invalid input(s): "FREET3" Anemia work up No results for input(s): "VITAMINB12", "FOLATE", "FERRITIN", "TIBC", "IRON", "RETICCTPCT" in the last 72 hours. Urinalysis    Component Value Date/Time   COLORURINE YELLOW 10/16/2023 0405   APPEARANCEUR CLEAR 10/16/2023 0405   APPEARANCEUR Clear 11/15/2014 1755   LABSPEC 1.014 10/16/2023 0405   LABSPEC 1.002 11/15/2014 1755   PHURINE 5.0 10/16/2023 0405   GLUCOSEU NEGATIVE 10/16/2023 0405   GLUCOSEU Negative 11/15/2014 1755   HGBUR NEGATIVE 10/16/2023 0405   BILIRUBINUR NEGATIVE 10/16/2023 0405  BILIRUBINUR Negative 11/15/2014 1755   KETONESUR NEGATIVE 10/16/2023 0405   PROTEINUR NEGATIVE 10/16/2023 0405   NITRITE NEGATIVE 10/16/2023 0405   LEUKOCYTESUR NEGATIVE 10/16/2023 0405   LEUKOCYTESUR Negative 11/15/2014 1755   Sepsis Labs Recent Labs  Lab 10/15/23 1930 10/16/23 1212 10/17/23 0438 10/19/23 0455  WBC 5.0 3.3* 2.6* 2.9*   Microbiology No results found for this or any previous visit (from the past 240 hour(s)).   Time coordinating discharge: 35 minutes  SIGNED:   Dorcas Carrow, MD  [facility] 10/20/2023, 10:47 AM

## 2023-10-20 NOTE — TOC Transition Note (Signed)
Transition of Care [facility]) - CM/SW Discharge Note   Patient Details  Name: Gary [name] MRN: [id number] Date of Birth: 1986-10-26  Transition of Care [facility]) CM/SW Contact:  Marliss Coots, LCSW Phone Number: 10/20/2023, 10:17 AM   Clinical Narrative:     10:18 AM CSW provided bus passes not discharge today. No further assistance was requested at this time.   Final next level of care: Homeless Shelter Barriers to Discharge: Barriers Resolved   Patient Goals and CMS Choice      Discharge Placement                      Patient and family notified of of transfer: 10/20/23  Discharge Plan and Services Additional resources added to the After Visit Summary for   In-house Referral: Clinical Social Work                                   Social Determinants of Health (SDOH) Interventions SDOH Screenings   Food Insecurity: Patient Declined (10/16/2023)  Recent Concern: Food Insecurity - Food Insecurity Present (10/10/2023)  Housing: Patient Declined (10/10/2023)  Recent Concern: Housing - High Risk (10/03/2023)  Transportation Needs: Unmet Transportation Needs (10/16/2023)  Utilities: Patient Declined (10/16/2023)  Alcohol Screen: High Risk (07/25/2021)  Depression (PHQ2-9): Medium Risk (02/12/2021)  Financial Resource Strain: Patient Declined (10/11/2023)  Social Connections: Unknown (06/24/2022)   Received from [facility]  Stress: No Stress Concern Present (06/30/2022)   Received from [facility], [facility]  Recent Concern: Stress - Stress Concern Present (06/25/2022)   Received from [facility]  Tobacco Use: High Risk (10/17/2023)     Readmission Risk Interventions    10/11/2023   12:26 PM 10/03/2023   11:23 AM 09/30/2023    3:35 PM  Readmission Risk Prevention Plan  Transportation Screening Complete Complete Complete  Medication Review (RN Care Manager) Referral to Pharmacy Complete Complete  PCP or Specialist appointment within  3-5 days of discharge Patient refused Patient refused Patient refused  HRI or Home Care Consult Patient refused Complete Complete  SW Recovery Care/Counseling Consult Complete Patient refused Complete  Palliative Care Screening Not Applicable Not Applicable Not Applicable  Skilled Nursing Facility Not Applicable Not Applicable Not Applicable

## 2023-10-20 NOTE — TOC Transition Note (Signed)
Transition of Care [facility]) - CM/SW Discharge Note   Patient Details  Name: Gary [name] MRN: [id number] Date of Birth: 04-10-1986  Transition of Care [facility]) CM/SW Contact:  Harriet Masson, RN Phone Number: 10/20/2023, 12:12 PM   Clinical Narrative:    Patient declineds TOC making him a PCP apt at this time.    Final next level of care: Homeless Shelter Barriers to Discharge: Barriers Resolved   Patient Goals and CMS Choice      Discharge Placement                      Patient and family notified of of transfer: 10/20/23  Discharge Plan and Services Additional resources added to the After Visit Summary for   In-house Referral: Clinical Social Work                                   Social Determinants of Health (SDOH) Interventions SDOH Screenings   Food Insecurity: Patient Declined (10/16/2023)  Recent Concern: Food Insecurity - Food Insecurity Present (10/10/2023)  Housing: Patient Declined (10/10/2023)  Recent Concern: Housing - High Risk (10/03/2023)  Transportation Needs: Unmet Transportation Needs (10/16/2023)  Utilities: Patient Declined (10/16/2023)  Alcohol Screen: High Risk (07/25/2021)  Depression (PHQ2-9): Medium Risk (02/12/2021)  Financial Resource Strain: Patient Declined (10/11/2023)  Social Connections: Unknown (06/24/2022)   Received from [facility], [facility]  Stress: No Stress Concern Present (06/30/2022)   Received from [facility], [facility]  Recent Concern: Stress - Stress Concern Present (06/25/2022)   Received from [facility]  Tobacco Use: High Risk (10/17/2023)     Readmission Risk Interventions    10/11/2023   12:26 PM 10/03/2023   11:23 AM 09/30/2023    3:35 PM  Readmission Risk Prevention Plan  Transportation Screening Complete Complete Complete  Medication Review (RN Care Manager) Referral to Pharmacy Complete Complete  PCP or Specialist appointment within 3-5 days of discharge Patient refused Patient  refused Patient refused  HRI or Home Care Consult Patient refused Complete Complete  SW Recovery Care/Counseling Consult Complete Patient refused Complete  Palliative Care Screening Not Applicable Not Applicable Not Applicable  Skilled Nursing Facility Not Applicable Not Applicable Not Applicable

## 2023-10-24 ENCOUNTER — Inpatient Hospital Stay (HOSPITAL_COMMUNITY)
Admission: EM | Admit: 2023-10-24 | Discharge: 2023-10-28 | DRG: 438 | Discharge status: Against Medical Advice | Payer: MEDICAID | Attending: Internal Medicine | Admitting: Internal Medicine

## 2023-10-24 ENCOUNTER — Other Ambulatory Visit: Payer: Self-pay

## 2023-10-24 DIAGNOSIS — Z800 Family history of malignant neoplasm of digestive organs: Secondary | ICD-10-CM

## 2023-10-24 DIAGNOSIS — G40909 Epilepsy, unspecified, not intractable, without status epilepticus: Secondary | ICD-10-CM | POA: Diagnosis present

## 2023-10-24 DIAGNOSIS — Z79899 Other long term (current) drug therapy: Secondary | ICD-10-CM

## 2023-10-24 DIAGNOSIS — Z5900 Homelessness unspecified: Secondary | ICD-10-CM

## 2023-10-24 DIAGNOSIS — Z811 Family history of alcohol abuse and dependence: Secondary | ICD-10-CM | POA: Diagnosis not present

## 2023-10-24 DIAGNOSIS — F1020 Alcohol dependence, uncomplicated: Secondary | ICD-10-CM | POA: Diagnosis present

## 2023-10-24 DIAGNOSIS — Z21 Asymptomatic human immunodeficiency virus [HIV] infection status: Secondary | ICD-10-CM | POA: Diagnosis present

## 2023-10-24 DIAGNOSIS — K852 Alcohol induced acute pancreatitis without necrosis or infection: Principal | ICD-10-CM | POA: Diagnosis present

## 2023-10-24 DIAGNOSIS — Z91148 Patient's other noncompliance with medication regimen for other reason: Secondary | ICD-10-CM | POA: Diagnosis not present

## 2023-10-24 DIAGNOSIS — E876 Hypokalemia: Secondary | ICD-10-CM | POA: Diagnosis present

## 2023-10-24 DIAGNOSIS — R109 Unspecified abdominal pain: Secondary | ICD-10-CM | POA: Diagnosis present

## 2023-10-24 DIAGNOSIS — K861 Other chronic pancreatitis: Secondary | ICD-10-CM | POA: Diagnosis present

## 2023-10-24 DIAGNOSIS — Z765 Malingerer [conscious simulation]: Secondary | ICD-10-CM

## 2023-10-24 DIAGNOSIS — Z5329 Procedure and treatment not carried out because of patient's decision for other reasons: Secondary | ICD-10-CM | POA: Diagnosis present

## 2023-10-24 DIAGNOSIS — Z888 Allergy status to other drugs, medicaments and biological substances status: Secondary | ICD-10-CM

## 2023-10-24 DIAGNOSIS — E43 Unspecified severe protein-calorie malnutrition: Secondary | ICD-10-CM | POA: Diagnosis present

## 2023-10-24 DIAGNOSIS — R54 Age-related physical debility: Secondary | ICD-10-CM | POA: Diagnosis present

## 2023-10-24 DIAGNOSIS — E162 Hypoglycemia, unspecified: Secondary | ICD-10-CM | POA: Diagnosis present

## 2023-10-24 DIAGNOSIS — Z6820 Body mass index (BMI) 20.0-20.9, adult: Secondary | ICD-10-CM | POA: Diagnosis not present

## 2023-10-24 DIAGNOSIS — F17210 Nicotine dependence, cigarettes, uncomplicated: Secondary | ICD-10-CM | POA: Diagnosis present

## 2023-10-24 DIAGNOSIS — K859 Acute pancreatitis without necrosis or infection, unspecified: Secondary | ICD-10-CM | POA: Diagnosis present

## 2023-10-24 LAB — COMPREHENSIVE METABOLIC PANEL
ALT: 27 U/L (ref 0–44)
AST: 34 U/L (ref 15–41)
Albumin: 3.4 g/dL — ABNORMAL LOW (ref 3.5–5.0)
Alkaline Phosphatase: 63 U/L (ref 38–126)
Anion gap: 15 (ref 5–15)
BUN: <5 mg/dL — ABNORMAL LOW (ref 6–20)
CO2: 28 mmol/L (ref 22–32)
Calcium: 9.1 mg/dL (ref 8.9–10.3)
Chloride: 100 mmol/L (ref 98–111)
Creatinine, Ser: 0.56 mg/dL — ABNORMAL LOW (ref 0.61–1.24)
GFR, Estimated: >60 mL/min (ref >60)
Glucose, Bld: 108 mg/dL — ABNORMAL HIGH (ref 70–99)
Potassium: 3.5 mmol/L (ref 3.5–5.1)
Sodium: 143 mmol/L (ref 135–145)
Total Bilirubin: 0.6 mg/dL (ref <1.2)
Total Protein: 7.5 g/dL (ref 6.5–8.1)

## 2023-10-24 LAB — URINALYSIS, ROUTINE W REFLEX MICROSCOPIC
Bilirubin Urine: NEGATIVE
Glucose, UA: NEGATIVE mg/dL
Hgb urine dipstick: NEGATIVE
Ketones, ur: NEGATIVE mg/dL
Leukocytes,Ua: NEGATIVE
Nitrite: NEGATIVE
Protein, ur: NEGATIVE mg/dL
Specific Gravity, Urine: 1.006 (ref 1.005–1.030)
pH: 6 (ref 5.0–8.0)

## 2023-10-24 LAB — CBC WITH DIFFERENTIAL/PLATELET
Abs Immature Granulocytes: 0.02 10*3/uL (ref 0.00–0.07)
Basophils Absolute: 0.1 10*3/uL (ref 0.0–0.1)
Basophils Relative: 2 %
Eosinophils Absolute: 0 10*3/uL (ref 0.0–0.5)
Eosinophils Relative: 1 %
HCT: 40.4 % (ref 39.0–52.0)
Hemoglobin: 13.5 g/dL (ref 13.0–17.0)
Immature Granulocytes: 0 %
Lymphocytes Relative: 40 %
Lymphs Abs: 2.7 10*3/uL (ref 0.7–4.0)
MCH: 31 pg (ref 26.0–34.0)
MCHC: 33.4 g/dL (ref 30.0–36.0)
MCV: 92.9 fL (ref 80.0–100.0)
Monocytes Absolute: 0.5 10*3/uL (ref 0.1–1.0)
Monocytes Relative: 7 %
Neutro Abs: 3.3 10*3/uL (ref 1.7–7.7)
Neutrophils Relative %: 50 %
Platelets: 715 10*3/uL — ABNORMAL HIGH (ref 150–400)
RBC: 4.35 MIL/uL (ref 4.22–5.81)
RDW: 15.4 % (ref 11.5–15.5)
WBC: 6.6 10*3/uL (ref 4.0–10.5)
nRBC: 0 % (ref 0.0–0.2)

## 2023-10-24 LAB — LIPASE, BLOOD: Lipase: 595 U/L — ABNORMAL HIGH (ref 11–51)

## 2023-10-24 MED ORDER — ACETAMINOPHEN 500 MG PO TABS
500.0000 mg | ORAL_TABLET | Freq: Four times a day (QID) | ORAL | Status: DC | PRN
  Administered 2023-10-26 – 2023-10-28 (×4): 500 mg via ORAL
  Filled 2023-10-24 (×4): qty 1

## 2023-10-24 MED ORDER — MELATONIN 5 MG PO TABS
5.0000 mg | ORAL_TABLET | Freq: Every evening | ORAL | Status: DC | PRN
  Administered 2023-10-27 (×2): 5 mg via ORAL
  Filled 2023-10-24 (×2): qty 1

## 2023-10-24 MED ORDER — ENOXAPARIN SODIUM 40 MG/0.4ML IJ SOSY
40.0000 mg | PREFILLED_SYRINGE | INTRAMUSCULAR | Status: DC
  Administered 2023-10-26: 40 mg via SUBCUTANEOUS
  Filled 2023-10-24 (×2): qty 0.4

## 2023-10-24 MED ORDER — HYDROMORPHONE HCL 1 MG/ML IJ SOLN
0.5000 mg | INTRAMUSCULAR | Status: DC | PRN
  Administered 2023-10-25 – 2023-10-26 (×6): 0.5 mg via INTRAVENOUS
  Filled 2023-10-24 (×2): qty 0.5
  Filled 2023-10-24: qty 1
  Filled 2023-10-24 (×3): qty 0.5

## 2023-10-24 MED ORDER — LACTATED RINGERS IV SOLN: INTRAVENOUS | Status: AC

## 2023-10-24 MED ORDER — MORPHINE SULFATE (PF) 4 MG/ML IV SOLN
4.0000 mg | Freq: Once | INTRAVENOUS | Status: AC
Start: 2023-10-24 — End: 2023-10-24
  Administered 2023-10-24: 4 mg via INTRAVENOUS
  Filled 2023-10-24: qty 1

## 2023-10-24 MED ORDER — ONDANSETRON HCL 4 MG/2ML IJ SOLN
4.0000 mg | Freq: Once | INTRAMUSCULAR | Status: AC
  Administered 2023-10-24: 4 mg via INTRAVENOUS
  Filled 2023-10-24: qty 2

## 2023-10-24 MED ORDER — POLYETHYLENE GLYCOL 3350 17 G PO PACK: 17.0000 g | PACK | Freq: Every day | ORAL | Status: DC | PRN

## 2023-10-24 MED ORDER — OXYCODONE HCL 5 MG PO TABS
5.0000 mg | ORAL_TABLET | Freq: Four times a day (QID) | ORAL | Status: DC | PRN
  Administered 2023-10-25 – 2023-10-27 (×5): 5 mg via ORAL
  Filled 2023-10-24 (×5): qty 1

## 2023-10-24 MED ORDER — MORPHINE SULFATE (PF) 4 MG/ML IV SOLN
4.0000 mg | Freq: Once | INTRAVENOUS | Status: AC
  Administered 2023-10-24: 4 mg via INTRAVENOUS
  Filled 2023-10-24: qty 1

## 2023-10-24 MED ORDER — LACTATED RINGERS IV BOLUS
1000.0000 mL | Freq: Once | INTRAVENOUS | Status: AC
  Administered 2023-10-24: 1000 mL via INTRAVENOUS

## 2023-10-24 MED ORDER — PROCHLORPERAZINE EDISYLATE 10 MG/2ML IJ SOLN
5.0000 mg | Freq: Four times a day (QID) | INTRAMUSCULAR | Status: DC | PRN
  Administered 2023-10-26 – 2023-10-27 (×2): 5 mg via INTRAVENOUS
  Filled 2023-10-24 (×2): qty 2

## 2023-10-24 NOTE — ED Triage Notes (Signed)
Pt [facility]IB [facility]. Pt c/o abd pain that started yesterday but got significantly worse tonight. Pt has extensive hx of ETOH abuse and  pancreatitis. Last alcohol drink was about 0500 this morning.  Pt also ran out of home meds 4days ago and states he had 2 seizures yesterday.

## 2023-10-24 NOTE — ED Provider Notes (Signed)
[place] EMERGENCY DEPARTMENT AT [facility] Provider Note  CSN: [id number] Arrival date & time: 10/24/23 2004  Chief Complaint(s) Abdominal Pain  HPI Gary [name] is a 37 y.o. male with PMH alcohol abuse, PTSD, HIV, recurrent hospital admission for alcohol and this pancreatitis who presents emergency department for evaluation of abdominal pain nausea and vomiting.  States that last alcoholic drink was this morning around 5 AM.  Was recently discharged on 10/20/2023 for alcoholic induced pancreatitis.  Currently denies chest pain, shortness breath, headache, fever or other systemic symptoms.   Past Medical History Past Medical History:  Diagnosis Date   Alcohol abuse    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   Anxiety    Bipolar 2 disorder (HCC)    HIV (human immunodeficiency virus infection) (HCC)    Pancreatitis    PTSD (post-traumatic stress disorder)    Schizophrenia (HCC)    Seizures (HCC)    Subdural hematoma (HCC)    Patient Active Problem List   Diagnosis Date Noted   Pancreatitis 10/16/2023   Acute on chronic pancreatitis (HCC) 10/16/2023   Recurrent acute pancreatitis 10/09/2023   Macrocytic anemia 09/26/2023   Alcoholic fatty liver 09/25/2023   Protein-calorie malnutrition, severe 12/25/2022   Hypomagnesemia 12/24/2022   Alcohol withdrawal syndrome without complication (HCC) 12/23/2022   Seizure disorder (HCC) 12/17/2022   Pancytopenia (HCC) 12/14/2022   Alcohol-induced pancreatitis 12/14/2022   Delirium tremens (HCC) 10/19/2022   Alcohol abuse with alcohol-induced mood disorder (HCC) 10/16/2022   Alcohol dependence with withdrawal (HCC) 10/16/2022   Abnormal LFTs 10/16/2022   Acute alcoholic pancreatitis 09/11/2022   AKI (acute kidney injury) (HCC) 05/28/2022   Homelessness 05/28/2022   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   Metabolic acidosis with increased anion gap and accumulation of organic acids 04/16/2022   Hypokalemia 01/22/2022    Alcohol-induced insomnia (HCC)    MDD (major depressive disorder), recurrent episode, 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/26/2021   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   Alcohol abuse    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   Malingering 05/15/2020   Malnutrition of moderate degree 05/07/2020   Alcoholic ketoacidosis 05/05/2020   Thrombocytopenia (HCC) 07/25/2019   Traumatic subdural hematoma (HCC) 07/02/2019   Alcoholic intoxication with complication (HCC) 07/02/2019   Hypocalcemia 07/02/2019   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   Alcohol use disorder, severe, dependence (HCC) 04/16/2019   Major depressive disorder, 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 Authorizing Provider  bictegravir-emtricitabine-tenofovir AF (BIKTARVY) 50-200-25 MG TABS tablet Take 1 tablet by mouth daily.   Yes [provider]  levETIRAcetam (KEPPRA) 500 MG tablet Take 1 tablet (500 mg total) by mouth 2 (two) times daily. 10/20/23 11/19/23 Yes Dorcas Carrow, MD  lipase/protease/amylase (CREON) 36000 UNITS CPEP capsule Take 1 capsule (36,000 Units total) by mouth 3 (three) times daily before meals. 10/20/23  Yes Dorcas Carrow, MD  Oxycodone HCl 10 MG TABS Take 1 tablet (10 mg total) by mouth every 4 (four) hours as needed for up to 5 days for moderate pain (pain score 4-6) or severe pain (pain score 7-10). 10/20/23 10/25/23  Dorcas Carrow, MD  pantoprazole (PROTONIX) 40 MG tablet Take 1 tablet (40 mg total) by mouth daily. 10/20/23  Yes Dorcas Carrow, MD  thiamine (VITAMIN B1) 100 MG tablet Take 1 tablet (100  mg total) by mouth daily. 10/21/23 11/20/23 Yes Dorcas Carrow, MD  famotidine (PEPCID) 20 MG tablet Take 1 tablet  (20 mg total) by mouth 2 (two) times daily. Patient not taking: Reported on 06/01/2019 05/14/19 06/02/19  Benjiman Core, MD                                                                                                                                    Past Surgical History Past Surgical History:  Procedure Laterality Date   BIOPSY  04/19/2022   Procedure: BIOPSY;  Surgeon: Meridee Score Netty Starring., MD;  Location: [facility] ENDOSCOPY;  Service: Gastroenterology;;   ENTEROSCOPY N/A 04/19/2022   Procedure: ENTEROSCOPY;  Surgeon: Meridee Score Netty Starring., MD;  Location: [facility] ENDOSCOPY;  Service: Gastroenterology;  Laterality: N/A;   INCISION AND DRAINAGE PERIRECTAL ABSCESS N/A 09/24/2016   Procedure: IRRIGATION AND DEBRIDEMENT PERIRECTAL ABSCESS;  Surgeon: Ricarda Frame, MD;  Location: [facility] ORS;  Service: General;  Laterality: N/A;   none     Family History Family History  Problem Relation Age of Onset   Alcohol abuse Mother    Alcohol abuse Father    Colon cancer Other    Other Other    Cancer Other     Social History Social History   Tobacco Use   Smoking status: Every Day    Current packs/day: 0.50    Types: Cigarettes   Smokeless tobacco: Never   Tobacco comments:    unable to smoke while incarcerated 6+ months 02/13/20  Vaping Use   Vaping status: Never Used  Substance Use Topics   Alcohol use: Yes    Alcohol/week: 105.0 standard drinks of alcohol    Types: 105 Cans of beer per week    Comment: drinks every day, all day, "whatever I can get my hands on"   Drug use: Yes    Types: Methamphetamines    Comment: last used 04/10/2019   Allergies Tegretol [carbamazepine] and Caffeine  Review of Systems Review of Systems  Gastrointestinal:  Positive for abdominal pain, nausea and vomiting.    Physical Exam Vital Signs  I have reviewed the triage vital signs BP 101/81   Pulse (!) 106   Temp 98.2 F (36.8 C) (Oral)   Resp 13   Ht 6' (1.829 m)   Wt 68 kg   SpO2 100%   BMI  20.34 kg/m   Physical Exam Constitutional:      General: He is not in acute distress.    Appearance: Normal appearance.  HENT:     Head: Normocephalic and atraumatic.     Nose: No congestion or rhinorrhea.  Eyes:     General:        Right eye: No discharge.        Left eye: No discharge.     Extraocular Movements: Extraocular movements intact.     Pupils: Pupils are equal, round, and reactive to light.  Cardiovascular:  Rate and Rhythm: Normal rate and regular rhythm.     Heart sounds: No murmur heard. Pulmonary:     Effort: No respiratory distress.     Breath sounds: No wheezing or rales.  Abdominal:     General: There is no distension.     Tenderness: There is abdominal tenderness in the epigastric area.  Musculoskeletal:        General: Normal range of motion.     Cervical back: Normal range of motion.  Skin:    General: Skin is warm and dry.  Neurological:     General: No focal deficit present.     Mental Status: He is alert.     ED Results and Treatments Labs (all labs ordered are listed, but only abnormal results are displayed) Labs Reviewed  CBC WITH DIFFERENTIAL/PLATELET - Abnormal; Notable for the following components:      Result Value   Platelets 715 (*)    All other components within normal limits  URINALYSIS, ROUTINE W REFLEX MICROSCOPIC  COMPREHENSIVE METABOLIC PANEL  LIPASE, BLOOD                                                                                                                          Radiology No results found.  Pertinent labs & imaging results that were available during my care of the patient were reviewed by me and considered in my medical decision making (see MDM for details).  Medications Ordered in ED Medications  morphine (PF) 4 MG/ML injection 4 mg (4 mg Intravenous Given 10/24/23 2148)  ondansetron (ZOFRAN) injection 4 mg (4 mg Intravenous Given 10/24/23 2148)  lactated ringers bolus 1,000 mL (1,000 mLs Intravenous  New Bag/Given 10/24/23 2153)                                                                                                                                     Procedures Procedures  (including critical care time)  Medical Decision Making / ED Course   This patient presents to the ED for concern of abdominal pain, this involves an extensive number of treatment options, and is a complaint that carries with it a high risk of complications and morbidity.  The differential diagnosis includes GERD/gastritis, peptic ulcer disease, pancreatitis, gastroparesis, pneumonia, pleurisy, pericarditis  MDM: Patient seen emergency room for evaluation of epigastric abdominal pain nausea and vomiting.  Physical exam with tenderness in  the right upper quadrant and epigastrium.  Laboratory evaluation with a lipase of 595 but is otherwise unremarkable.  Pain medicine and fluid resuscitation begun.  Patient will require hospital admission for acute on chronic alcoholic pancreatitis.   Additional history obtained:  -External records from outside source obtained and reviewed including: Chart review including previous notes, labs, imaging, consultation notes   Lab Tests: -I ordered, reviewed, and interpreted labs.   The pertinent results include:   Labs Reviewed  CBC WITH DIFFERENTIAL/PLATELET - Abnormal; Notable for the following components:      Result Value   Platelets 715 (*)    All other components within normal limits  URINALYSIS, ROUTINE W REFLEX MICROSCOPIC  COMPREHENSIVE METABOLIC PANEL  LIPASE, BLOOD       Medicines ordered and prescription drug management: Meds ordered this encounter  Medications   morphine (PF) 4 MG/ML injection 4 mg   ondansetron (ZOFRAN) injection 4 mg   lactated ringers bolus 1,000 mL    -I have reviewed the patients home medicines and have made adjustments as needed  Critical interventions none   Cardiac Monitoring: The patient was maintained on a cardiac  monitor.  I personally viewed and interpreted the cardiac monitored which showed an underlying rhythm of: NSR  Social Determinants of Health:  Factors impacting patients care include: Alcohol use   Reevaluation: After the interventions noted above, I reevaluated the patient and found that they have :improved  Co morbidities that complicate the patient evaluation  Past Medical History:  Diagnosis Date   Alcohol abuse    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   Anxiety    Bipolar 2 disorder (HCC)    HIV (human immunodeficiency virus infection) (HCC)    Pancreatitis    PTSD (post-traumatic stress disorder)    Schizophrenia (HCC)    Seizures (HCC)    Subdural hematoma (HCC)       Dispostion: I considered admission for this patient, and patient will require hospital admission for alcoholic pancreatitis     Final Clinical Impression(s) / ED Diagnoses Final diagnoses:  None     @PCDICTATION @    Sawyer Kahan, Wyn Forster, MD 10/25/23 1254

## 2023-10-24 NOTE — ED Provider Notes (Signed)
I assumed care in signout.  Patient with history of HIV, alcohol use disorder presented with worsening abdominal pain. Patient worsening signs of pancreatitis by labs. Patient is had extensive CT imaging previously, this was deferred tonight. Plan was for admission due to intractable pain. Patient currently resting comfortably with appropriate vital signs Discussed case with Dr. Margo Aye for admission   Zadie Rhine, MD 10/24/23 [phone]

## 2023-10-25 ENCOUNTER — Encounter (HOSPITAL_COMMUNITY): Payer: Self-pay | Admitting: Internal Medicine

## 2023-10-25 DIAGNOSIS — K859 Acute pancreatitis without necrosis or infection, unspecified: Secondary | ICD-10-CM

## 2023-10-25 DIAGNOSIS — K852 Alcohol induced acute pancreatitis without necrosis or infection: Secondary | ICD-10-CM

## 2023-10-25 LAB — COMPREHENSIVE METABOLIC PANEL
ALT: 23 U/L (ref 0–44)
AST: 25 U/L (ref 15–41)
Albumin: 2.4 g/dL — ABNORMAL LOW (ref 3.5–5.0)
Alkaline Phosphatase: 45 U/L (ref 38–126)
Anion gap: 4 — ABNORMAL LOW (ref 5–15)
BUN: <5 mg/dL — ABNORMAL LOW (ref 6–20)
CO2: 30 mmol/L (ref 22–32)
Calcium: 7.7 mg/dL — ABNORMAL LOW (ref 8.9–10.3)
Chloride: 108 mmol/L (ref 98–111)
Creatinine, Ser: 0.5 mg/dL — ABNORMAL LOW (ref 0.61–1.24)
GFR, Estimated: >60 mL/min (ref >60)
Glucose, Bld: 67 mg/dL — ABNORMAL LOW (ref 70–99)
Potassium: 3.4 mmol/L — ABNORMAL LOW (ref 3.5–5.1)
Sodium: 142 mmol/L (ref 135–145)
Total Bilirubin: 0.5 mg/dL (ref <1.2)
Total Protein: 5.5 g/dL — ABNORMAL LOW (ref 6.5–8.1)

## 2023-10-25 LAB — CBC
HCT: 33.8 % — ABNORMAL LOW (ref 39.0–52.0)
Hemoglobin: 11.3 g/dL — ABNORMAL LOW (ref 13.0–17.0)
MCH: 31.2 pg (ref 26.0–34.0)
MCHC: 33.4 g/dL (ref 30.0–36.0)
MCV: 93.4 fL (ref 80.0–100.0)
Platelets: 474 10*3/uL — ABNORMAL HIGH (ref 150–400)
RBC: 3.62 MIL/uL — ABNORMAL LOW (ref 4.22–5.81)
RDW: 15.4 % (ref 11.5–15.5)
WBC: 4.8 10*3/uL (ref 4.0–10.5)
nRBC: 0 % (ref 0.0–0.2)

## 2023-10-25 LAB — GLUCOSE, CAPILLARY
Glucose-Capillary: 72 mg/dL (ref 70–99)
Glucose-Capillary: 89 mg/dL (ref 70–99)
Glucose-Capillary: 96 mg/dL (ref 70–99)

## 2023-10-25 LAB — MAGNESIUM: Magnesium: 1.3 mg/dL — ABNORMAL LOW (ref 1.7–2.4)

## 2023-10-25 LAB — PHOSPHORUS: Phosphorus: 3.1 mg/dL (ref 2.5–4.6)

## 2023-10-25 LAB — CBG MONITORING, ED: Glucose-Capillary: 72 mg/dL (ref 70–99)

## 2023-10-25 MED ORDER — LORAZEPAM 1 MG PO TABS
1.0000 mg | ORAL_TABLET | ORAL | Status: DC | PRN
Start: 2023-10-25 — End: 2023-10-25
  Administered 2023-10-25: 1 mg via ORAL
  Filled 2023-10-25: qty 1

## 2023-10-25 MED ORDER — LORAZEPAM 2 MG/ML IJ SOLN: 1.0000 mg | INTRAMUSCULAR | Status: DC | PRN

## 2023-10-25 MED ORDER — THIAMINE HCL 100 MG/ML IJ SOLN: 100.0000 mg | Freq: Every day | INTRAMUSCULAR | Status: DC

## 2023-10-25 MED ORDER — PANTOPRAZOLE SODIUM 40 MG PO TBEC
40.0000 mg | DELAYED_RELEASE_TABLET | Freq: Every day | ORAL | Status: DC
  Administered 2023-10-25 – 2023-10-28 (×4): 40 mg via ORAL
  Filled 2023-10-25 (×4): qty 1

## 2023-10-25 MED ORDER — POTASSIUM CHLORIDE 10 MEQ/100ML IV SOLN
10.0000 meq | INTRAVENOUS | Status: AC
Start: 2023-10-25 — End: 2023-10-25
  Administered 2023-10-25 (×2): 10 meq via INTRAVENOUS
  Filled 2023-10-25 (×2): qty 100

## 2023-10-25 MED ORDER — DEXTROSE 50 % IV SOLN
50.0000 mL | INTRAVENOUS | Status: DC | PRN
  Administered 2023-10-26: 50 mL via INTRAVENOUS
  Filled 2023-10-25: qty 50

## 2023-10-25 MED ORDER — HALOPERIDOL LACTATE 5 MG/ML IJ SOLN
5.0000 mg | Freq: Once | INTRAMUSCULAR | Status: AC
  Administered 2023-10-25: 5 mg via INTRAMUSCULAR
  Filled 2023-10-25: qty 1

## 2023-10-25 MED ORDER — PANCRELIPASE (LIP-PROT-AMYL) 36000-114000 UNITS PO CPEP
36000.0000 [IU] | ORAL_CAPSULE | Freq: Three times a day (TID) | ORAL | Status: DC
  Administered 2023-10-25 – 2023-10-28 (×8): 36000 [IU] via ORAL
  Filled 2023-10-25 (×11): qty 1

## 2023-10-25 MED ORDER — ADULT MULTIVITAMIN W/MINERALS CH
1.0000 | ORAL_TABLET | Freq: Every day | ORAL | Status: DC
  Administered 2023-10-25 – 2023-10-28 (×4): 1 via ORAL
  Filled 2023-10-25 (×4): qty 1

## 2023-10-25 MED ORDER — THIAMINE MONONITRATE 100 MG PO TABS
100.0000 mg | ORAL_TABLET | Freq: Every day | ORAL | Status: DC
  Administered 2023-10-25 – 2023-10-28 (×4): 100 mg via ORAL
  Filled 2023-10-25 (×4): qty 1

## 2023-10-25 MED ORDER — MAGNESIUM SULFATE 4 GM/100ML IV SOLN
4.0000 g | Freq: Once | INTRAVENOUS | Status: DC
  Filled 2023-10-25: qty 100

## 2023-10-25 MED ORDER — BICTEGRAVIR-EMTRICITAB-TENOFOV 50-200-25 MG PO TABS
1.0000 | ORAL_TABLET | Freq: Every day | ORAL | Status: DC
  Administered 2023-10-25 – 2023-10-28 (×4): 1 via ORAL
  Filled 2023-10-25 (×5): qty 1

## 2023-10-25 MED ORDER — FOLIC ACID 1 MG PO TABS
1.0000 mg | ORAL_TABLET | Freq: Every day | ORAL | Status: DC
  Administered 2023-10-25 – 2023-10-28 (×4): 1 mg via ORAL
  Filled 2023-10-25 (×4): qty 1

## 2023-10-25 MED ORDER — LORAZEPAM 2 MG/ML IJ SOLN
2.0000 mg | Freq: Three times a day (TID) | INTRAMUSCULAR | Status: DC
  Administered 2023-10-25 – 2023-10-27 (×6): 2 mg via INTRAVENOUS
  Filled 2023-10-25 (×6): qty 1

## 2023-10-25 MED ORDER — LACTATED RINGERS IV SOLN: INTRAVENOUS | Status: AC

## 2023-10-25 MED ORDER — MAGNESIUM SULFATE 4 GM/100ML IV SOLN
4.0000 g | Freq: Once | INTRAVENOUS | Status: AC
  Administered 2023-10-25: 4 g via INTRAVENOUS
  Filled 2023-10-25: qty 100

## 2023-10-25 MED ORDER — LEVETIRACETAM 500 MG PO TABS
500.0000 mg | ORAL_TABLET | Freq: Two times a day (BID) | ORAL | Status: DC
  Administered 2023-10-25 – 2023-10-28 (×7): 500 mg via ORAL
  Filled 2023-10-25 (×7): qty 1

## 2023-10-25 NOTE — Progress Notes (Signed)
PROGRESS NOTE    Gary [name]  WUJ:[id number] DOB: 09-Feb-1986 DOA: 10/24/2023 PCP: Default, Provider, MD  Chief Complaint  Patient presents with   Abdominal Pain    Hospital Course:  Gary [name] is 37 y.o. male with alcohol abuse, recurrent pancreatitis, HIV, seizure disorder, mesenteric thrombus and noncompliance with DOAC, multiple prior admissions for alcohol abuse and recurrent pancreatitis with poor adherence to medication prescriptions and general medical advice.  Patient presents on this admission complaining of abdominal pain.  Does report he has been decreasing his alcohol consumption.  On arrival he was found to have an elevated lipase of 595 with abdominal tenderness.  CIWA protocol was also initiated.  Subjective: Evaluation today patient is complaining of diffuse abdominal pain.  He is also endorsing the beginning of withdrawals.  He reports he is shaky.  Also endorsing nausea.  Also requesting something to eat.   Objective: Vitals:   10/25/23 0826 10/25/23 1037 10/25/23 1138 10/25/23 1319  BP: 92/60 96/65 96/71  96/71  Pulse: 76 70 67 67  Resp:  18  20  Temp:    98.6 F (37 C)  TempSrc:    Oral  SpO2:  97%  100%  Weight:      Height:        Intake/Output Summary (Last 24 hours) at 10/25/2023 1516 Last data filed at 10/24/2023 2316 Gross per 24 hour  Intake 1000 ml  Output --  Net 1000 ml   Filed Weights   10/24/23 2019  Weight: 68 kg    Examination: General exam: Appears calm and comfortable, thin, chronically ill-appearing Respiratory system: No work of breathing, symmetric chest wall expansion Cardiovascular system: S1 & S2 heard, RRR.  Gastrointestinal system: Abdomen diffusely tender to palpation Neuro: Alert and oriented. No focal neurological deficits.  Tremor in bilateral upper extremities Extremities: Symmetric, expected ROM, thin, frail Skin: No rashes, lesions Psychiatry: Demonstrates appropriate judgement and insight. Mood & affect  appropriate for situation.   Assessment & Plan:  Active Problems:   Pancreatitis    Recurrent acute alcoholic pancreatitis - Patient last drink earlier this morning - Lipase 595 on arrival - N.p.o. for now - LR at 100 cc/h -Continue IV analgesics as needed -Continue IV antiemetics as needed - Trend lipase -- Cont creon  HIV - Patient has been nonadherent outpatient - Will resume his home dose HAART for now - Obtain CD4 counts  Seizure disorder - Resume home dose AEDs - Seizure precautions  Alcohol abuse with alcohol withdrawal - Continue CIWA protocol - Currently on scheduled Ativan 2 mg every 8 hours - If patient is requiring greater amounts of this may have to transition to ICU for Precedex drip - Continue multivitamin, thiamine, folic acid supplements  Hypokalemia - Replace as needed  Hypomagnesemia - Replace as needed  Severe protein calorie malnutrition - Likely secondary to severe alcoholism - Albumin 2.4 - Nutrition consult  Hypoglycemia due to poor oral intake - Glucose 61 on arrival - Continue IV fluids as above - CBG per protocol - N.p.o. for now given severe abdominal pain  Narcotic seeking behavior - As noted on multiple prior admissions - Complicates care as above  Nonadherent - Patient has had multiple prior admissions for same reasons.  Frequently returns for ongoing pancreatitis as he continues to drink alcohol. - Complicates the above - High risk for readmissions - [facility] consult  Homeless - Patient is currently experiencing homelessness, this complicates discharge planning.  TOC consult as above  DVT prophylaxis:  Lovenox   Code Status: Full Code Family Communication: none at bedside. Discussed directly with patient.  Disposition:  Status is: Inpatient   Antimicrobials:  Anti-infectives (From admission, onward)    Start     Dose/Rate Route Frequency Ordered Stop   10/25/23 1000  bictegravir-emtricitabine-tenofovir AF (BIKTARVY)  50-200-25 MG per tablet 1 tablet        1 tablet Oral Daily 10/25/23 0611         Data Reviewed: I have personally reviewed following labs and imaging studies CBC: Recent Labs  Lab 10/19/23 0455 10/24/23 2145 10/25/23 0430  WBC 2.9* 6.6 4.8  NEUTROABS 2.3 3.3  --   HGB 10.9* 13.5 11.3*  HCT 32.1* 40.4 33.8*  MCV 94.4 92.9 93.4  PLT 208 715* 474*   Basic Metabolic Panel: Recent Labs  Lab 10/19/23 0455 10/24/23 2145 10/25/23 0430  NA 138 143 142  K 3.7 3.5 3.4*  CL 104 100 108  CO2 27 28 30   GLUCOSE 108* 108* 67*  BUN <5* <5* <5*  CREATININE 0.47* 0.56* 0.50*  CALCIUM 8.6* 9.1 7.7*  MG 1.7  --  1.3*  PHOS 4.6  --  3.1   GFR: Estimated Creatinine Clearance: 121.6 mL/min (A) (by C-G formula based on SCr of 0.5 mg/dL (L)). Liver Function Tests: Recent Labs  Lab 10/19/23 0455 10/24/23 2145 10/25/23 0430  AST 13* 34 25  ALT 11 27 23   ALKPHOS 45 63 45  BILITOT 0.3 0.6 0.5  PROT 5.5* 7.5 5.5*  ALBUMIN 2.4* 3.4* 2.4*   CBG: Recent Labs  Lab 10/25/23 0654 10/25/23 0801 10/25/23 1034  GLUCAP 72 72 89    No results found for this or any previous visit (from the past 240 hours).   Radiology Studies: No results found.  Scheduled Meds:  bictegravir-emtricitabine-tenofovir AF  1 tablet Oral Daily   enoxaparin (LOVENOX) injection  40 mg Subcutaneous Q24H   folic acid  1 mg Oral Daily   levETIRAcetam  500 mg Oral BID   lipase/protease/amylase  36,000 Units Oral TID AC   LORazepam  2 mg Intravenous Q8H   multivitamin with minerals  1 tablet Oral Daily   pantoprazole  40 mg Oral Daily   thiamine  100 mg Oral Daily   Or   thiamine  100 mg Intravenous Daily   Continuous Infusions:  lactated ringers 100 mL/hr at 10/25/23 1256   lactated ringers       LOS: 1 day    Time spent:   Debarah Crape, DO [facility]  To contact the attending physician between 7A-7P please use Epic Chat. To contact the covering physician during after hours  7P-7A, please review Amion.   10/25/2023, 3:16 PM   *This document has been created with the assistance of dictation software. Please excuse typographical errors. *

## 2023-10-25 NOTE — Progress Notes (Signed)
Ok to check HIV RNA and CD4 d/t a poor hx of adherence per Dr. Rennis Chris.  Ulyses Southward, PharmD, BCIDP, AAHIVP, CPP Infectious Disease Pharmacist 10/25/2023 8:26 AM

## 2023-10-25 NOTE — Progress Notes (Signed)
Transition of Care [facility]) - Inpatient Brief Assessment   Patient Details  Name: Gary [name] MRN: [id number] Date of Birth: 1986/01/19  Transition of Care [facility]) CM/SW Contact:    Janae Bridgeman, RN Phone Number: 10/25/2023, 1:59 PM   Clinical Narrative: Patient is homeless and admitted for pancreatitis.  Patient has history of ETOH and substance abuse.  Resources provided in the AVS.   Transition of Care Asessment: Insurance and Status: (P) Insurance coverage has been reviewed Patient has primary care physician: (P) No Home environment has been reviewed: (P) Homeless Prior level of function:: (P) Independent Prior/Current Home Services: (P) No current home services Social Drivers of Health Review: (P) SDOH reviewed interventions complete Readmission risk has been reviewed: (P) Yes Transition of care needs: (P) transition of care needs identified, TOC will continue to follow

## 2023-10-25 NOTE — Progress Notes (Signed)
Mobility Specialist Progress Note:    10/25/23 1428  Mobility  Activity Stood at bedside  Level of Assistance Modified independent, requires aide device or extra time  Assistive Device Other (Comment) (windowsill)  Activity Response Tolerated well  Mobility Referral Yes  Mobility visit 1 Mobility  Mobility Specialist Start Time (ACUTE ONLY) 1420  Mobility Specialist Stop Time (ACUTE ONLY) 1428  Mobility Specialist Time Calculation (min) (ACUTE ONLY) 8 min   Pt received in bed, c/o nausea, chills, pain, tremors, itching, and feeling overheated. Agreeable to mobility session. Pt able to stand at bedside with ModI, holding onto window sill for support. MinA required to stand via gait belt. Tolerated well, c/o abdominal pain. Limited by pain, deferred ambulation, returned to bed supine. Left with all needs met, call bell in reach, and alarm on.   Feliciana Rossetti Mobility Specialist Please contact via Special educational needs teacher or  Rehab office at [phone]

## 2023-10-25 NOTE — Evaluation (Signed)
Physical Therapy Evaluation Patient Details Name: Gary [name] MRN: [id number] DOB: 1986/08/25 Today's Date: 10/25/2023  History of Present Illness  Patient is a 37 year old male with recurrent acute alcoholic pancreatitis. History of ETOH, recurrent pancreatitis, HIV, seizure disorder, mesenteric thrombus with noncompliance with DOAC.  Clinical Impression  Patient is agreeable to PT evaluation. He reports he is independent at baseline but has right knee pain with history of falls. He reports he is homeless and lives outside. He expressed he wants to quit drinking alcohol for good after this hospital stay.  Today, the patient has tremors with mobility, reports nausea, and right knee pain with movement. Patient required CGA for safety with standing and for standing balance secondary to tremors. Anticipate mobility and independence will improve as symptoms improve. Recommend PT follow up while in the hospital to maximize independence in preparation for return to community.       If plan is discharge home, recommend the following: Assist for transportation     Equipment Recommendations None recommended by PT     Functional Status Assessment Patient has had a recent decline in their functional status and demonstrates the ability to make significant improvements in function in a reasonable and predictable amount of time.     Precautions / Restrictions Precautions Precautions: Fall Restrictions Weight Bearing Restrictions Per Provider Order: No      Mobility  Bed Mobility Overal bed mobility: Needs Assistance Bed Mobility: Supine to Sit, Sit to Supine     Supine to sit: Supervision, HOB elevated Sit to supine: Supervision, HOB elevated   General bed mobility comments: increased time required with mobility. pain reported in RLE knee with movement. upper body termors throughout    Transfers Overall transfer level: Needs assistance Equipment used: None Transfers: Sit to/from  Stand Sit to Stand: Contact guard assist           General transfer comment: CGA for safety secondary to tremors    Ambulation/Gait             Pre-gait activities: ambulation not attempted due to termors and generally feeling poorly with mobility (nausea, pain). CGA for standing balance without UE support and for taking 2 side steps towards head of bed    Stairs            Wheelchair Mobility     Tilt Bed    Modified Rankin (Stroke Patients Only)       Balance Overall balance assessment: Needs assistance Sitting-balance support: Feet supported Sitting balance-Leahy Scale: Fair     Standing balance support: Single extremity supported Standing balance-Leahy Scale: Fair                               Pertinent Vitals/Pain Pain Assessment Pain Assessment: Faces Faces Pain Scale: Hurts even more Pain Location: R knee    Home Living Family/patient expects to be discharged to:: Shelter/Homeless                   Additional Comments: patient reports he lives outside    Prior Function Prior Level of Function : Independent/Modified Independent;History of Falls (last six months)             Mobility Comments: no DME used, however patient reports right knee pain and frequent falls       Extremity/Trunk Assessment   Upper Extremity Assessment Upper Extremity Assessment: Generalized weakness (tremors noted)    Lower Extremity  Assessment Lower Extremity Assessment: Generalized weakness (R knee pain with AROM)       Communication   Communication Communication: No apparent difficulties  Cognition Arousal: Alert Behavior During Therapy: WFL for tasks assessed/performed Overall Cognitive Status: Within Functional Limits for tasks assessed                                          General Comments General comments (skin integrity, edema, etc.): patient expressed he wants to quit drinking for good after this  hospital stay    Exercises     Assessment/Plan    PT Assessment Patient needs continued PT services  PT Problem List Decreased strength;Decreased activity tolerance;Decreased mobility;Decreased balance;Pain       PT Treatment Interventions DME instruction;Gait training;Stair training;Functional mobility training;Therapeutic activities;Therapeutic exercise;Balance training;Neuromuscular re-education;Cognitive remediation;Patient/family education    PT Goals (Current goals can be found in the Care Plan section)  Acute Rehab PT Goals Patient Stated Goal: to quit alcohol for good PT Goal Formulation: With patient Time For Goal Achievement: 11/08/23 Potential to Achieve Goals: Fair    Frequency Min 1X/week     Co-evaluation               AM-PAC PT "6 Clicks" Mobility  Outcome Measure Help needed turning from your back to your side while in a flat bed without using bedrails?: None Help needed moving from lying on your back to sitting on the side of a flat bed without using bedrails?: A Little Help needed moving to and from a bed to a chair (including a wheelchair)?: A Little Help needed standing up from a chair using your arms (e.g., wheelchair or bedside chair)?: A Little Help needed to walk in hospital room?: A Little Help needed climbing 3-5 steps with a railing? : A Little 6 Click Score: 19    End of Session   Activity Tolerance: Patient limited by fatigue Patient left: in bed;with call bell/phone within reach;with bed alarm set Nurse Communication: Mobility status PT Visit Diagnosis: Unsteadiness on feet (R26.81);Muscle weakness (generalized) (M62.81)    Time: 1914-7829 PT Time Calculation (min) (ACUTE ONLY): 17 min   Charges:   PT Evaluation $PT Eval Moderate Complexity: 1 Mod   PT General Charges $$ ACUTE PT VISIT: 1 Visit         Donna Bernard, PT, MPT   Ina Homes 10/25/2023, 10:19 AM

## 2023-10-25 NOTE — H&P (Addendum)
History and Physical  CASHIS DIERKER MWN:[id number] DOB: [birth year] DOA: 10/24/2023  Referring physician: Dr. Bebe Shaggy, EDP  PCP: Default, Provider, MD  Outpatient Specialists: None Patient coming from: Homeless  Chief Complaint: Severe abdominal pain  HPI: Gary [name] is a 37 y.o. male with medical history significant for alcohol abuse, recurrent pancreatitis, HIV, seizure disorder, mesenteric thrombus with noncompliance with DOAC, multiple admissions for alcohol abuse and recurrent pancreatitis who presents today with complaints of severe upper abdominal pain after drinking wine this morning.  States he has been cutting down on the amount of alcohol he consumes.  In the ED, workup revealed elevated lipase level 595 with exam findings of upper abdominal tenderness.  The patient was started on supportive care for recurrent acute alcoholic pancreatitis.  CIWA protocol has been initiated.  Admitted by [facility], hospitalist service.  ED Course: Temperature 98.4.  BP 87/60, pulse 69, respiratory 12, O2 saturation 94% on room air.  Review of Systems: Review of systems as noted in the HPI. All other systems reviewed and are negative.   Past Medical History:  Diagnosis Date   Alcohol abuse    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   Anxiety    Bipolar 2 disorder (HCC)    HIV (human immunodeficiency virus infection) (HCC)    Pancreatitis    PTSD (post-traumatic stress disorder)    Schizophrenia (HCC)    Seizures (HCC)    Subdural hematoma (HCC)    Past Surgical History:  Procedure Laterality Date   BIOPSY  04/19/2022   Procedure: BIOPSY;  Surgeon: Lemar Lofty., MD;  Location: [facility] ENDOSCOPY;  Service: Gastroenterology;;   ENTEROSCOPY N/A 04/19/2022   Procedure: ENTEROSCOPY;  Surgeon: Lemar Lofty., MD;  Location: [facility] ENDOSCOPY;  Service: Gastroenterology;  Laterality: N/A;   INCISION AND DRAINAGE PERIRECTAL ABSCESS N/A 09/24/2016   Procedure: IRRIGATION AND  DEBRIDEMENT PERIRECTAL ABSCESS;  Surgeon: Ricarda Frame, MD;  Location: [facility] ORS;  Service: General;  Laterality: N/A;   none      Social History:  reports that he has been smoking cigarettes. He has never used smokeless tobacco. He reports current alcohol use of about 105.0 standard drinks of alcohol per week. He reports current drug use. Drug: Methamphetamines.   Allergies  Allergen Reactions   Tegretol [Carbamazepine] Other (See Comments)    Caused vertigo for 2 days after taking it   Caffeine Palpitations    Family History  Problem Relation Age of Onset   Alcohol abuse Mother    Alcohol abuse Father    Colon cancer Other    Other Other    Cancer Other       Prior to Admission medications   Medication Sig Start Date End Date Taking? Authorizing Provider  bictegravir-emtricitabine-tenofovir AF (BIKTARVY) 50-200-25 MG TABS tablet Take 1 tablet by mouth daily.   Yes [provider]  levETIRAcetam (KEPPRA) 500 MG tablet Take 1 tablet (500 mg total) by mouth 2 (two) times daily. 10/20/23 11/19/23 Yes Dorcas Carrow, MD  lipase/protease/amylase (CREON) 36000 UNITS CPEP capsule Take 1 capsule (36,000 Units total) by mouth 3 (three) times daily before meals. 10/20/23  Yes Dorcas Carrow, MD  pantoprazole (PROTONIX) 40 MG tablet Take 1 tablet (40 mg total) by mouth daily. 10/20/23  Yes Dorcas Carrow, MD  thiamine (VITAMIN B1) 100 MG tablet Take 1 tablet (100 mg total) by mouth daily. 10/21/23 11/20/23 Yes Dorcas Carrow, MD  Oxycodone HCl 10 MG TABS Take 1 tablet (10 mg total) by mouth every 4 (four) hours as  needed for up to 5 days for moderate pain (pain score 4-6) or severe pain (pain score 7-10). 10/20/23 10/25/23  Dorcas Carrow, MD  famotidine (PEPCID) 20 MG tablet Take 1 tablet (20 mg total) by mouth 2 (two) times daily. Patient not taking: Reported on 06/01/2019 05/14/19 06/02/19  Benjiman Core, MD    Physical Exam: BP 96/76   Pulse 86   Temp 98.4 F (36.9 C)  (Oral)   Resp 18   Ht 6' (1.829 m)   Wt 68 kg   SpO2 99%   BMI 20.34 kg/m   General: 37 y.o. year-old male well developed well nourished in no acute distress.  Alert and oriented x3. Cardiovascular: Regular rate and rhythm with no rubs or gallops.  No thyromegaly or JVD noted.  No lower extremity edema. 2/4 pulses in all 4 extremities. Respiratory: Clear to auscultation with no wheezes or rales. Good inspiratory effort. Abdomen: Soft with upper abdominal tenderness.  Nondistended with normal bowel sounds x4 quadrants. Muskuloskeletal: No cyanosis, clubbing or edema noted bilaterally Neuro: CN II-XII intact, strength, sensation, reflexes Skin: No ulcerative lesions noted or rashes Psychiatry: Judgement and insight appear normal. Mood is appropriate for condition and setting          Labs on Admission:  Basic Metabolic Panel: Recent Labs  Lab 10/19/23 0455 10/24/23 2145 10/25/23 0430  NA 138 143 142  K 3.7 3.5 3.4*  CL 104 100 108  CO2 27 28 30   GLUCOSE 108* 108* 67*  BUN <5* <5* <5*  CREATININE 0.47* 0.56* 0.50*  CALCIUM 8.6* 9.1 7.7*  MG 1.7  --  1.3*  PHOS 4.6  --  3.1   Liver Function Tests: Recent Labs  Lab 10/19/23 0455 10/24/23 2145 10/25/23 0430  AST 13* 34 25  ALT 11 27 23   ALKPHOS 45 63 45  BILITOT 0.3 0.6 0.5  PROT 5.5* 7.5 5.5*  ALBUMIN 2.4* 3.4* 2.4*   Recent Labs  Lab 10/19/23 0455 10/24/23 2145  LIPASE 103* 595*   No results for input(s): "AMMONIA" in the last 168 hours. CBC: Recent Labs  Lab 10/19/23 0455 10/24/23 2145 10/25/23 0430  WBC 2.9* 6.6 4.8  NEUTROABS 2.3 3.3  --   HGB 10.9* 13.5 11.3*  HCT 32.1* 40.4 33.8*  MCV 94.4 92.9 93.4  PLT 208 715* 474*   Cardiac Enzymes: No results for input(s): "CKTOTAL", "CKMB", "CKMBINDEX", "TROPONINI" in the last 168 hours.  BNP (last 3 results) No results for input(s): "BNP" in the last 8760 hours.  ProBNP (last 3 results) No results for input(s): "PROBNP" in the last 8760  hours.  CBG: No results for input(s): "GLUCAP" in the last 168 hours.  Radiological Exams on Admission: No results found.  EKG: I independently viewed the EKG done and my findings are as followed: None available at the time of this visit.  Assessment/Plan Present on Admission:  Pancreatitis  Active Problems:   Pancreatitis  Recurrent acute alcoholic pancreatitis Last alcohol intake this morning Presented with lipase level greater than 590 Upper abdominal tenderness on exam Continue supportive care IV fluid LR 125 cc/h x 1 day IV analgesics as needed IV antiemetics as needed Repeat lipase level in the morning  HIV Resume home HAART  Seizure disorder Resume home AEDs Seizure precautions  Alcohol abuse with concern for alcohol withdrawal CIWA protocol Multivitamin, thiamine and folic acid supplements  Hypokalemia Serum sodium 3.4 Repleted intravenously  Hypomagnesemia Magnesium 1.3 Repleted intravenously  Severe protein calorie malnutrition Albumin 2.4 BMI  20 Increase oral intake as tolerated when pancreatitis has resolved  Hypoglycemia due to poor oral intake Serum glucose 61 IV D50 as needed CBG per protocol Clear liquid diet, advance as tolerated  Generalized weakness PT OT assessment Fall precautions   Time: 75 minutes.   DVT prophylaxis: Subcu Lovenox daily  Code Status: Full code.  Family Communication: None at bedside.  Disposition Plan: Admitted to telemetry medical unit  Consults called: None.  Admission status: Inpatient status.   Status is: Inpatient The patient requires at least 2 midnights for further evaluation and treatment of pedunculation.   Darlin Drop MD [facility] Pager [phone]  If 7PM-7AM, please contact night-coverage [email or web address] Password [email or web address]  10/25/2023, 6:07 AM

## 2023-10-25 NOTE — ED Notes (Signed)
ED TO INPATIENT HANDOFF REPORT  ED Nurse Name and Phone #: Forde Dandy [phone]  S Name/Age/Gender Gary [name] 37 y.o. male Room/Bed: 007C/007C  Code Status   Code Status: Full Code  Home/SNF/Other Home    Patient oriented to: self, place, time, and situation Is this baseline? Yes   Triage Complete: Triage complete  Chief Complaint Pancreatitis [K85.90]  Triage Note [facility]. Pt c/o abd pain that started yesterday but got significantly worse tonight. Pt has extensive hx of ETOH abuse and  pancreatitis. Last alcohol drink was about 0500 this morning.  Pt also ran out of home meds 4days ago and states he had 2 seizures yesterday.    Allergies Allergies  Allergen Reactions   Tegretol [Carbamazepine] Other (See Comments)    Caused vertigo for 2 days after taking it   Caffeine Palpitations    Level of Care/Admitting Diagnosis ED Disposition     ED Disposition  Admit   Condition  --   Comment  Hospital Area: [facility] [[id number]]  Level of Care: Telemetry Medical [104]  May admit patient to [name] or [facility] if equivalent level of care is available:: Yes  Covid Evaluation: Asymptomatic - no recent exposure (last 10 days) testing not required  Diagnosis: Pancreatitis [202663]  Admitting Physician: Darlin Drop [[id number]]  Attending Physician: Darlin Drop [[id number]]  Certification:: I certify this patient will need inpatient services for at least 2 midnights  Expected Medical Readiness: 10/26/2023          B Medical/Surgery History Past Medical History:  Diagnosis Date   Alcohol abuse    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   Anxiety    Bipolar 2 disorder (HCC)    HIV (human immunodeficiency virus infection) (HCC)    Pancreatitis    PTSD (post-traumatic stress disorder)    Schizophrenia (HCC)    Seizures (HCC)    Subdural hematoma (HCC)    Past Surgical History:  Procedure Laterality Date   BIOPSY   04/19/2022   Procedure: BIOPSY;  Surgeon: Lemar Lofty., MD;  Location: [facility] ENDOSCOPY;  Service: Gastroenterology;;   ENTEROSCOPY N/A 04/19/2022   Procedure: ENTEROSCOPY;  Surgeon: Lemar Lofty., MD;  Location: [facility] ENDOSCOPY;  Service: Gastroenterology;  Laterality: N/A;   INCISION AND DRAINAGE PERIRECTAL ABSCESS N/A 09/24/2016   Procedure: IRRIGATION AND DEBRIDEMENT PERIRECTAL ABSCESS;  Surgeon: Ricarda Frame, MD;  Location: [facility] ORS;  Service: General;  Laterality: N/A;   none       A IV Location/Drains/Wounds Patient Lines/Drains/Airways Status     Active Line/Drains/Airways     Name Placement date Placement time Site Days   Peripheral IV 10/24/23 20 G Right Antecubital 10/24/23  2144  Antecubital  1            Intake/Output Last 24 hours  Intake/Output Summary (Last 24 hours) at 10/25/2023 0723 Last data filed at 10/24/2023 2316 Gross per 24 hour  Intake 1000 ml  Output --  Net 1000 ml    Labs/Imaging Results for orders placed or performed during the hospital encounter of 10/24/23 (from the past 48 hours)  Urinalysis, Routine w reflex microscopic -Urine, Clean Catch     Status: None   Collection Time: 10/24/23  8:42 PM  Result Value Ref Range   Color, Urine YELLOW YELLOW   APPearance CLEAR CLEAR   Specific Gravity, Urine 1.006 1.005 - 1.030   pH 6.0 5.0 - 8.0   Glucose, UA NEGATIVE NEGATIVE mg/dL  Hgb urine dipstick NEGATIVE NEGATIVE   Bilirubin Urine NEGATIVE NEGATIVE   Ketones, ur NEGATIVE NEGATIVE mg/dL   Protein, ur NEGATIVE NEGATIVE mg/dL   Nitrite NEGATIVE NEGATIVE   Leukocytes,Ua NEGATIVE NEGATIVE    Comment: Performed at [facility], [address], [place], [place] [address]  CBC with Differential     Status: Abnormal   Collection Time: 10/24/23  9:45 PM  Result Value Ref Range   WBC 6.6 4.0 - 10.5 K/uL   RBC 4.35 4.22 - 5.81 MIL/uL   Hemoglobin 13.5 13.0 - 17.0 g/dL   HCT 60.4 54.0 - 98.1 %   MCV 92.9 80.0 - 100.0 fL    MCH 31.0 26.0 - 34.0 pg   MCHC 33.4 30.0 - 36.0 g/dL   RDW 19.1 47.8 - 29.5 %   Platelets 715 (H) 150 - 400 K/uL   nRBC 0.0 0.0 - 0.2 %   Neutrophils Relative % 50 %   Neutro Abs 3.3 1.7 - 7.7 K/uL   Lymphocytes Relative 40 %   Lymphs Abs 2.7 0.7 - 4.0 K/uL   Monocytes Relative 7 %   Monocytes Absolute 0.5 0.1 - 1.0 K/uL   Eosinophils Relative 1 %   Eosinophils Absolute 0.0 0.0 - 0.5 K/uL   Basophils Relative 2 %   Basophils Absolute 0.1 0.0 - 0.1 K/uL   Immature Granulocytes 0 %   Abs Immature Granulocytes 0.02 0.00 - 0.07 K/uL    Comment: Performed at [facility], [address], [place], [place] [address]  Comprehensive metabolic panel     Status: Abnormal   Collection Time: 10/24/23  9:45 PM  Result Value Ref Range   Sodium 143 135 - 145 mmol/L   Potassium 3.5 3.5 - 5.1 mmol/L   Chloride 100 98 - 111 mmol/L   CO2 28 22 - 32 mmol/L   Glucose, Bld 108 (H) 70 - 99 mg/dL    Comment: Glucose reference range applies only to samples taken after fasting for at least 8 hours.   BUN <5 (L) 6 - 20 mg/dL   Creatinine, Ser 8.65 (L) 0.61 - 1.24 mg/dL   Calcium 9.1 8.9 - 78.4 mg/dL   Total Protein 7.5 6.5 - 8.1 g/dL   Albumin 3.4 (L) 3.5 - 5.0 g/dL   AST 34 15 - 41 U/L   ALT 27 0 - 44 U/L   Alkaline Phosphatase 63 38 - 126 U/L   Total Bilirubin 0.6 <1.2 mg/dL   GFR, Estimated >69 >62 mL/min    Comment: (NOTE) Calculated using the CKD-EPI Creatinine Equation (2021)    Anion gap 15 5 - 15    Comment: Performed at [facility], [address], [place], [place] [address]  Lipase, blood     Status: Abnormal   Collection Time: 10/24/23  9:45 PM  Result Value Ref Range   Lipase 595 (H) 11 - 51 U/L    Comment: RESULTS CONFIRMED BY MANUAL DILUTION Performed at [facility], [address] [place], [place] [address]   CBC     Status: Abnormal   Collection Time: 10/25/23  4:30 AM  Result Value Ref Range   WBC 4.8 4.0 - 10.5 K/uL   RBC 3.62 (L) 4.22 - 5.81 MIL/uL    Hemoglobin 11.3 (L) 13.0 - 17.0 g/dL   HCT 01.0 (L) 27.2 - 53.6 %   MCV 93.4 80.0 - 100.0 fL   MCH 31.2 26.0 - 34.0 pg  MCHC 33.4 30.0 - 36.0 g/dL   RDW 16.1 09.6 - 04.5 %   Platelets 474 (H) 150 - 400 K/uL   nRBC 0.0 0.0 - 0.2 %    Comment: Performed at [facility], [address] [place], [place] [address]  Comprehensive metabolic panel     Status: Abnormal   Collection Time: 10/25/23  4:30 AM  Result Value Ref Range   Sodium 142 135 - 145 mmol/L   Potassium 3.4 (L) 3.5 - 5.1 mmol/L   Chloride 108 98 - 111 mmol/L   CO2 30 22 - 32 mmol/L   Glucose, Bld 67 (L) 70 - 99 mg/dL    Comment: Glucose reference range applies only to samples taken after fasting for at least 8 hours.   BUN <5 (L) 6 - 20 mg/dL   Creatinine, Ser 1.91 (L) 0.61 - 1.24 mg/dL   Calcium 7.7 (L) 8.9 - 10.3 mg/dL   Total Protein 5.5 (L) 6.5 - 8.1 g/dL   Albumin 2.4 (L) 3.5 - 5.0 g/dL   AST 25 15 - 41 U/L   ALT 23 0 - 44 U/L   Alkaline Phosphatase 45 38 - 126 U/L   Total Bilirubin 0.5 <1.2 mg/dL   GFR, Estimated >47 >82 mL/min    Comment: (NOTE) Calculated using the CKD-EPI Creatinine Equation (2021)    Anion gap 4 (L) 5 - 15    Comment: Performed at [facility], [address] [place], [place] [address]  Magnesium     Status: Abnormal   Collection Time: 10/25/23  4:30 AM  Result Value Ref Range   Magnesium 1.3 (L) 1.7 - 2.4 mg/dL    Comment: Performed at [facility], [address] [place], [place] [address]  Phosphorus     Status: None   Collection Time: 10/25/23  4:30 AM  Result Value Ref Range   Phosphorus 3.1 2.5 - 4.6 mg/dL    Comment: Performed at [facility], [address] [place], [place] [address]  CBG monitoring, ED     Status: None   Collection Time: 10/25/23  6:54 AM  Result Value Ref Range   Glucose-Capillary 72 70 - 99 mg/dL    Comment: Glucose reference range applies only to samples taken after fasting for at least 8 hours.   No results found.  Pending  Labs Unresulted Labs (From admission, onward)     Start     Ordered   10/31/23 0500  Creatinine, serum  (enoxaparin (LOVENOX)    CrCl >/= 30 ml/min)  Weekly,   R     Comments: while on enoxaparin therapy    10/24/23 2345            Vitals/Pain Today's Vitals   10/25/23 0512 10/25/23 0513 10/25/23 0645 10/25/23 0649  BP:  95/63 95/63   Pulse:  79 95   Resp:   11   Temp:      TempSrc:      SpO2:   95%   Weight:      Height:      PainSc: 10-Worst pain ever   8     Isolation Precautions No active isolations  Medications Medications  enoxaparin (LOVENOX) injection 40 mg (has no administration in time range)  lactated ringers infusion ( Intravenous New Bag/Given 10/25/23 0000)  acetaminophen (TYLENOL) tablet 500 mg (has no administration in time range)  prochlorperazine (COMPAZINE) injection 5 mg (has no administration in time range)  polyethylene glycol (MIRALAX / GLYCOLAX) packet 17 g (has no administration in time range)  melatonin tablet 5 mg (has no administration in time range)  oxyCODONE (Oxy IR/ROXICODONE) immediate release tablet 5 mg (5 mg Oral Given 10/25/23 0045)  HYDROmorphone (DILAUDID) injection 0.5 mg (0.5 mg Intravenous Given 10/25/23 0515)  LORazepam (ATIVAN) tablet 1-4 mg (1 mg Oral Given 10/25/23 0449)    Or  LORazepam (ATIVAN) injection 1-4 mg ( Intravenous See Alternative 10/25/23 0449)  thiamine (VITAMIN B1) tablet 100 mg (has no administration in time range)    Or  thiamine (VITAMIN B1) injection 100 mg (has no administration in time range)  folic acid (FOLVITE) tablet 1 mg (has no administration in time range)  multivitamin with minerals tablet 1 tablet (has no administration in time range)  levETIRAcetam (KEPPRA) tablet 500 mg (has no administration in time range)  bictegravir-emtricitabine-tenofovir AF (BIKTARVY) 50-200-25 MG per tablet 1 tablet (has no administration in time range)  lipase/protease/amylase (CREON) capsule 36,000 Units (has no  administration in time range)  pantoprazole (PROTONIX) EC tablet 40 mg (has no administration in time range)  potassium chloride 10 mEq in 100 mL IVPB (10 mEq Intravenous New Bag/Given 10/25/23 0700)  magnesium sulfate IVPB 4 g 100 mL (has no administration in time range)  dextrose 50 % solution 50 mL (has no administration in time range)  morphine (PF) 4 MG/ML injection 4 mg (4 mg Intravenous Given 10/24/23 2148)  ondansetron (ZOFRAN) injection 4 mg (4 mg Intravenous Given 10/24/23 2148)  lactated ringers bolus 1,000 mL (0 mLs Intravenous Stopped 10/24/23 2316)  morphine (PF) 4 MG/ML injection 4 mg (4 mg Intravenous Given 10/24/23 2316)    Mobility walks     Focused Assessments .   R Recommendations: See Admitting Provider Note  Report given to:   Additional Notes: .

## 2023-10-26 DIAGNOSIS — K852 Alcohol induced acute pancreatitis without necrosis or infection: Secondary | ICD-10-CM | POA: Diagnosis not present

## 2023-10-26 LAB — CBC WITH DIFFERENTIAL/PLATELET
Abs Immature Granulocytes: 0.01 10*3/uL (ref 0.00–0.07)
Basophils Absolute: 0 10*3/uL (ref 0.0–0.1)
Basophils Relative: 1 %
Eosinophils Absolute: 0.1 10*3/uL (ref 0.0–0.5)
Eosinophils Relative: 2 %
HCT: 32.2 % — ABNORMAL LOW (ref 39.0–52.0)
Hemoglobin: 10.7 g/dL — ABNORMAL LOW (ref 13.0–17.0)
Immature Granulocytes: 0 %
Lymphocytes Relative: 34 %
Lymphs Abs: 1 10*3/uL (ref 0.7–4.0)
MCH: 31.1 pg (ref 26.0–34.0)
MCHC: 33.2 g/dL (ref 30.0–36.0)
MCV: 93.6 fL (ref 80.0–100.0)
Monocytes Absolute: 0.3 10*3/uL (ref 0.1–1.0)
Monocytes Relative: 9 %
Neutro Abs: 1.5 10*3/uL — ABNORMAL LOW (ref 1.7–7.7)
Neutrophils Relative %: 54 %
Platelets: 338 10*3/uL (ref 150–400)
RBC: 3.44 MIL/uL — ABNORMAL LOW (ref 4.22–5.81)
RDW: 15.2 % (ref 11.5–15.5)
WBC: 2.9 10*3/uL — ABNORMAL LOW (ref 4.0–10.5)
nRBC: 0 % (ref 0.0–0.2)

## 2023-10-26 LAB — COMPREHENSIVE METABOLIC PANEL
ALT: 23 U/L (ref 0–44)
AST: 30 U/L (ref 15–41)
Albumin: 2.6 g/dL — ABNORMAL LOW (ref 3.5–5.0)
Alkaline Phosphatase: 58 U/L (ref 38–126)
Anion gap: 6 (ref 5–15)
BUN: <5 mg/dL — ABNORMAL LOW (ref 6–20)
CO2: 27 mmol/L (ref 22–32)
Calcium: 8.6 mg/dL — ABNORMAL LOW (ref 8.9–10.3)
Chloride: 101 mmol/L (ref 98–111)
Creatinine, Ser: 0.58 mg/dL — ABNORMAL LOW (ref 0.61–1.24)
GFR, Estimated: >60 mL/min (ref >60)
Glucose, Bld: 167 mg/dL — ABNORMAL HIGH (ref 70–99)
Potassium: 3.4 mmol/L — ABNORMAL LOW (ref 3.5–5.1)
Sodium: 134 mmol/L — ABNORMAL LOW (ref 135–145)
Total Bilirubin: 0.9 mg/dL (ref <1.2)
Total Protein: 5.4 g/dL — ABNORMAL LOW (ref 6.5–8.1)

## 2023-10-26 LAB — GLUCOSE, CAPILLARY
Glucose-Capillary: 68 mg/dL — ABNORMAL LOW (ref 70–99)
Glucose-Capillary: 84 mg/dL (ref 70–99)
Glucose-Capillary: 113 mg/dL — ABNORMAL HIGH (ref 70–99)
Glucose-Capillary: 139 mg/dL — ABNORMAL HIGH (ref 70–99)

## 2023-10-26 LAB — HIV-1 RNA QUANT-NO REFLEX-BLD
HIV 1 RNA Quant: 4520 {copies}/mL
LOG10 HIV-1 RNA: 3.655 {Log}

## 2023-10-26 LAB — LIPASE, BLOOD: Lipase: 90 U/L — ABNORMAL HIGH (ref 11–51)

## 2023-10-26 LAB — T-HELPER CELLS (CD4) COUNT (NOT AT ARMC)
CD4 % Helper T Cell: 48 % (ref 33–65)
CD4 T Cell Abs: 566 /uL (ref 400–1790)

## 2023-10-26 MED ORDER — DEXTROSE-SODIUM CHLORIDE 5-0.45 % IV SOLN: INTRAVENOUS | Status: DC

## 2023-10-26 MED ORDER — HYDROMORPHONE HCL 1 MG/ML IJ SOLN
0.5000 mg | Freq: Once | INTRAMUSCULAR | Status: AC
  Administered 2023-10-27: 0.5 mg via INTRAVENOUS
  Filled 2023-10-26: qty 0.5

## 2023-10-26 MED ORDER — HALOPERIDOL LACTATE 5 MG/ML IJ SOLN
5.0000 mg | Freq: Once | INTRAMUSCULAR | Status: AC
  Administered 2023-10-26: 5 mg via INTRAVENOUS
  Filled 2023-10-26: qty 1

## 2023-10-26 NOTE — Progress Notes (Addendum)
Patient's nurse reported that patient requesting additional pain meds, they DC his iv dilaudid, he wants it back. he says he is withdrawing, he wants ativan, that was also Dc'd today. he is now on oxy 5mg  q 4, got it last 1822, not due until 2222. he also says the 5mg  is not enough for his pain that's a 10.  -Seems like that patient has been medication seeking behavior.  It is very hard to negotiate with the patient and make him understand that he does not need any pain medication.  Ordered Dilaudid 0.5 mg one-time dose.  Tereasa Coop, MD [facility] 10/26/2023, 9:15 PM

## 2023-10-26 NOTE — Progress Notes (Signed)
PROGRESS NOTE    Gary [name]  ZOX:[id number] DOB: 1986/06/26 DOA: 10/24/2023 PCP: Default, Provider, MD  Chief Complaint  Patient presents with   Abdominal Pain    Hospital Course:  Gary [name] is 37 y.o. male with alcohol abuse, recurrent pancreatitis, HIV, seizure disorder, mesenteric thrombus and noncompliance with DOAC, multiple prior admissions for alcohol abuse and recurrent pancreatitis with poor adherence to medication prescriptions and general medical advice.  Patient presents on this admission complaining of abdominal pain.  Does report he has been decreasing his alcohol consumption.  On arrival he was found to have an elevated lipase of 595 with abdominal tenderness.  CIWA protocol was also initiated.  Subjective: Hypoglycemia this morning 69, received amp of D50.  Reports worsening tremors and agitation.  He is nontachycardic, no tremors appreciated, and requesting extra ativan. Haldol given  Objective: Vitals:   10/25/23 1924 10/25/23 2338 10/26/23 0444 10/26/23 0811  BP: 95/75 98/67 98/69  94/64  Pulse: (!) 52 (!) 50 (!) 56 (!) 49  Resp:   18   Temp: 98.5 F (36.9 C)  98.7 F (37.1 C) 97.8 F (36.6 C)  TempSrc:   Oral   SpO2: 97%  97% 97%  Weight:      Height:        Intake/Output Summary (Last 24 hours) at 10/26/2023 0907 Last data filed at 10/26/2023 0841 Gross per 24 hour  Intake 2200.74 ml  Output 1900 ml  Net 300.74 ml   Filed Weights   10/24/23 2019  Weight: 68 kg    Examination: General exam: Appears calm and comfortable, thin, chronically ill-appearing Respiratory system: No work of breathing, symmetric chest wall expansion Cardiovascular system: S1 & S2 heard, RRR.  Gastrointestinal system: Abdomen diffusely tender to palpation Neuro: Alert and oriented. No focal neurological deficits.  No tremors Extremities: Symmetric, expected ROM, thin, frail Skin: No rashes, lesions Psychiatry: Demonstrates appropriate judgement and insight. Mood  & affect appropriate for situation.   Assessment & Plan:  Active Problems:   Pancreatitis    Recurrent acute alcoholic pancreatitis - Patient last drink on day of arrival - Lipase 595 on arrival, downtrending -- Still endorsing significant pain, npo for now -- Hypoglycemia - D5 1/2 NS now. -Continue IV analgesics as needed -Continue IV antiemetics as needed - Trend lipase -- Cont chronic creon  HIV - Patient has been nonadherent outpatient - Will resume his home dose HAART for now - Obtain CD4 counts  Seizure disorder - Resume home dose AEDs - Seizure precautions  Alcohol abuse with alcohol withdrawal - Continue CIWA protocol - Currently on scheduled Ativan 2 mg every 8 hours -- No objective evidence of withdrawal at this time. - If patient is requiring greater amounts of this may have to transition to ICU for Precedex drip - Continue multivitamin, thiamine, folic acid supplements  Hypokalemia - Replace as needed  Hypomagnesemia - Replace as needed  Severe protein calorie malnutrition - Likely secondary to severe alcoholism - Albumin 2.4 - Nutrition consult  Hypoglycemia due to poor oral intake - Glucose 61 on arrival - Continue IV fluids as above - CBG per protocol - N.p.o. for now given severe abdominal pain from pancreatitis  Narcotic seeking behavior - As noted on multiple prior admissions - Complicates care as above  Nonadherent - Patient has had multiple prior admissions for same reasons.  Frequently returns for ongoing pancreatitis as he continues to drink alcohol. - Complicates the above - High risk for readmissions - [facility] consult  Homeless - Patient is currently experiencing homelessness, this complicates discharge planning.  TOC consult as above  DVT prophylaxis: Lovenox   Code Status: Full Code Family Communication: none at bedside. Discussed directly with patient.  Disposition:  Status is: Inpatient   Antimicrobials:  Anti-infectives  (From admission, onward)    Start     Dose/Rate Route Frequency Ordered Stop   10/25/23 1000  bictegravir-emtricitabine-tenofovir AF (BIKTARVY) 50-200-25 MG per tablet 1 tablet        1 tablet Oral Daily 10/25/23 0611         Data Reviewed: I have personally reviewed following labs and imaging studies CBC: Recent Labs  Lab 10/24/23 2145 10/25/23 0430  WBC 6.6 4.8  NEUTROABS 3.3  --   HGB 13.5 11.3*  HCT 40.4 33.8*  MCV 92.9 93.4  PLT 715* 474*   Basic Metabolic Panel: Recent Labs  Lab 10/24/23 2145 10/25/23 0430  NA 143 142  K 3.5 3.4*  CL 100 108  CO2 28 30  GLUCOSE 108* 67*  BUN <5* <5*  CREATININE 0.56* 0.50*  CALCIUM 9.1 7.7*  MG  --  1.3*  PHOS  --  3.1   GFR: Estimated Creatinine Clearance: 121.6 mL/min (A) (by C-G formula based on SCr of 0.5 mg/dL (L)). Liver Function Tests: Recent Labs  Lab 10/24/23 2145 10/25/23 0430  AST 34 25  ALT 27 23  ALKPHOS 63 45  BILITOT 0.6 0.5  PROT 7.5 5.5*  ALBUMIN 3.4* 2.4*   CBG: Recent Labs  Lab 10/25/23 0654 10/25/23 0801 10/25/23 1034 10/25/23 1529 10/26/23 0833  GLUCAP 72 72 89 96 68*    No results found for this or any previous visit (from the past 240 hours).   Radiology Studies: No results found.  Scheduled Meds:  bictegravir-emtricitabine-tenofovir AF  1 tablet Oral Daily   enoxaparin (LOVENOX) injection  40 mg Subcutaneous Q24H   folic acid  1 mg Oral Daily   levETIRAcetam  500 mg Oral BID   lipase/protease/amylase  36,000 Units Oral TID AC   LORazepam  2 mg Intravenous Q8H   multivitamin with minerals  1 tablet Oral Daily   pantoprazole  40 mg Oral Daily   thiamine  100 mg Oral Daily   Or   thiamine  100 mg Intravenous Daily   Continuous Infusions:  dextrose 5 % and 0.45 % NaCl     lactated ringers 100 mL/hr at 10/26/23 0115     LOS: 2 days    Time spent:   Debarah Crape, DO [facility]  To contact the attending physician between 7A-7P please use Epic Chat. To  contact the covering physician during after hours 7P-7A, please review Amion.   10/26/2023, 9:07 AM   *This document has been created with the assistance of dictation software. Please excuse typographical errors. *

## 2023-10-26 NOTE — Plan of Care (Signed)

## 2023-10-26 NOTE — Progress Notes (Signed)
Mobility Specialist Progress Note:    10/26/23 1543  Mobility  Activity Stood at bedside  Level of Assistance Modified independent, requires aide device or extra time  Assistive Device Other (Comment) (IV pole)  Activity Response Tolerated well  Mobility Referral Yes  Mobility visit 1 Mobility  Mobility Specialist Start Time (ACUTE ONLY) 1535  Mobility Specialist Stop Time (ACUTE ONLY) 1540  Mobility Specialist Time Calculation (min) (ACUTE ONLY) 5 min   Pt received in bed, agreeable to mobility session. Able to stand at bedside with ModI using IV pole. Tolerated well, however deferred ambulation d/t pt feeling nauseous and gagging. Pt reports stabbing feeling in stomach and is requesting food and/or pain medication. Left pt with all needs met, call bell in reach and bed alarm on.   Feliciana Rossetti Mobility Specialist Please contact via Special educational needs teacher or  Rehab office at [phone]

## 2023-10-26 NOTE — Evaluation (Signed)
Occupational Therapy Evaluation Patient Details Name: Gary [name] MRN: [id number] DOB: 1986/06/22 Today's Date: 10/26/2023   History of Present Illness Patient is a 37 year old male with recurrent acute alcoholic pancreatitis. History of ETOH, recurrent pancreatitis, HIV, seizure disorder, mesenteric thrombus with noncompliance with DOAC.   Clinical Impression   This 37 yo male admitted with above presents to acute OT with PLOF of being homeless and taking care of himself. Currently he has pain in stomach with me and right knee pain with PT as well as mild tremors/shakes from withdrawls as well as decreased cognition thus affecting his safety and independence with basic ADLs and mobility. He will continue to benefit from acute OT with hopefully no need for follow up.        If plan is discharge home, recommend the following: A little help with walking and/or transfers;A lot of help with bathing/dressing/bathroom;Assistance with cooking/housework;Assist for transportation;Assistance with feeding;Direct supervision/assist for financial management;Direct supervision/assist for medications management;Help with stairs or ramp for entrance;Supervision due to cognitive status    Functional Status Assessment  Patient has had a recent decline in their functional status and demonstrates the ability to make significant improvements in function in a reasonable and predictable amount of time.  Equipment Recommendations  None recommended by OT       Precautions / Restrictions Precautions Precautions: Fall Restrictions Weight Bearing Restrictions Per Provider Order: No      Mobility Bed Mobility Overal bed mobility: Needs Assistance Bed Mobility: Supine to Sit, Sit to Supine     Supine to sit: Supervision, HOB elevated Sit to supine: Supervision, HOB elevated   General bed mobility comments: increased time required with mobility    Transfers Overall transfer level: Needs  assistance Equipment used: None Transfers: Sit to/from Stand Sit to Stand: Min assist                  Balance Overall balance assessment: Needs assistance Sitting-balance support: No upper extremity supported, Feet supported Sitting balance-Leahy Scale: Fair     Standing balance support: Single extremity supported Standing balance-Leahy Scale: Poor                             ADL either performed or assessed with clinical judgement   ADL Overall ADL's : Needs assistance/impaired Eating/Feeding: NPO   Grooming: Wash/dry face;Set up;Supervision/safety;Sitting   Upper Body Bathing: Minimal assistance;Sitting   Lower Body Bathing: Moderate assistance Lower Body Bathing Details (indicate cue type and reason): with min A sit <>stand Upper Body Dressing : Minimal assistance;Sitting   Lower Body Dressing: Moderate assistance Lower Body Dressing Details (indicate cue type and reason): with min A sit <>stand Toilet Transfer: Minimal assistance Toilet Transfer Details (indicate cue type and reason): simulated holding onto IV Pole with one hand and the bed rails and foot board with other hand and he walked around the bed and then back around to where he started (slow pace) Toileting- Architect and Hygiene: Moderate assistance Toileting - Clothing Manipulation Details (indicate cue type and reason): with min A sit <>stand             Vision   Additional Comments: Reports he does not wear glasses. When first entered he had his eyelids open but you could only see the whites of his eyes until I asked him to look at me and then he rolled his eyes down so you could see his whole eye and it  appeared he needed increased time to focus (but when asked he said no, he was fine)            Pertinent Vitals/Pain Pain Assessment Pain Assessment: Faces Faces Pain Scale: Hurts little more Pain Location: stomach Pain Descriptors / Indicators: Aching, Sore Pain  Intervention(s): Limited activity within patient's tolerance, Monitored during session     Extremity/Trunk Assessment Upper Extremity Assessment Upper Extremity Assessment: Generalized weakness (mild tremor/shakiness when he holds his hands out)           Communication Communication Communication:  (low tone of speech)   Cognition Arousal: Lethargic Behavior During Therapy: Flat affect (low tone of speech) Overall Cognitive Status: Impaired/Different from baseline                                 General Comments: Pt able to tell me why he is npo (pancreaitis and emesis due to withdrawing), but then at the end of the session he said "hey can you get me something to eat"                Home Living Family/patient expects to be discharged to:: Shelter/Homeless                                 Additional Comments: patient reports he lives outside      Prior Functioning/Environment Prior Level of Function : Independent/Modified Independent;History of Falls (last six months)             Mobility Comments: no DME used, however patient reports right knee pain and frequent falls          OT Problem List: Decreased activity tolerance;Impaired balance (sitting and/or standing);Impaired vision/perception;Pain;Decreased cognition;Decreased safety awareness      OT Treatment/Interventions: Self-care/ADL training;DME and/or AE instruction;Balance training;Patient/family education    OT Goals(Current goals can be found in the care plan section) Acute Rehab OT Goals Patient Stated Goal: to get something to eat OT Goal Formulation: With patient Time For Goal Achievement: 11/09/23 Potential to Achieve Goals: Good  OT Frequency: Min 1X/week       AM-PAC OT "6 Clicks" Daily Activity     Outcome Measure Help from another person eating meals?: Total Help from another person taking care of personal grooming?: A Little Help from another person  toileting, which includes using toliet, bedpan, or urinal?: A Lot Help from another person bathing (including washing, rinsing, drying)?: A Lot Help from another person to put on and taking off regular upper body clothing?: A Lot Help from another person to put on and taking off regular lower body clothing?: A Lot 6 Click Score: 12   End of Session Equipment Utilized During Treatment: Gait belt  Activity Tolerance: Patient limited by lethargy Patient left: in bed;with call bell/phone within reach (Bed alarm would not set despite everything I tried. Reported this to RN and we decided I would put a chair alarm pad under him in bed and have secretary call factilites to come look at the bed.)  OT Visit Diagnosis: Unsteadiness on feet (R26.81);Other abnormalities of gait and mobility (R26.89);Repeated falls (R29.6);History of falling (Z91.81);Low vision, both eyes (H54.2);Other symptoms and signs involving cognitive function;Pain Pain - part of body:  (stomach)                Time: 2841-3244 OT Time Calculation (min): 34 min Charges:  OT General Charges $OT Visit: 1 Visit OT Evaluation $OT Eval Moderate Complexity: 1 Mod OT Treatments $Self Care/Home Management : 8-22 mins  Lindon Romp OT Acute Rehabilitation Services Office [phone]    Evette Georges 10/26/2023, 11:27 AM

## 2023-10-27 DIAGNOSIS — K852 Alcohol induced acute pancreatitis without necrosis or infection: Secondary | ICD-10-CM | POA: Diagnosis not present

## 2023-10-27 LAB — CBC WITH DIFFERENTIAL/PLATELET
Abs Immature Granulocytes: 0.01 10*3/uL (ref 0.00–0.07)
Basophils Absolute: 0 10*3/uL (ref 0.0–0.1)
Basophils Relative: 1 %
Eosinophils Absolute: 0.2 10*3/uL (ref 0.0–0.5)
Eosinophils Relative: 5 %
HCT: 34.7 % — ABNORMAL LOW (ref 39.0–52.0)
Hemoglobin: 11.5 g/dL — ABNORMAL LOW (ref 13.0–17.0)
Immature Granulocytes: 0 %
Lymphocytes Relative: 43 %
Lymphs Abs: 1.2 10*3/uL (ref 0.7–4.0)
MCH: 31.3 pg (ref 26.0–34.0)
MCHC: 33.1 g/dL (ref 30.0–36.0)
MCV: 94.6 fL (ref 80.0–100.0)
Monocytes Absolute: 0.3 10*3/uL (ref 0.1–1.0)
Monocytes Relative: 9 %
Neutro Abs: 1.2 10*3/uL — ABNORMAL LOW (ref 1.7–7.7)
Neutrophils Relative %: 42 %
Platelets: 287 10*3/uL (ref 150–400)
RBC: 3.67 MIL/uL — ABNORMAL LOW (ref 4.22–5.81)
RDW: 15.1 % (ref 11.5–15.5)
WBC: 2.9 10*3/uL — ABNORMAL LOW (ref 4.0–10.5)
nRBC: 0 % (ref 0.0–0.2)

## 2023-10-27 LAB — COMPREHENSIVE METABOLIC PANEL
ALT: 18 U/L (ref 0–44)
AST: 24 U/L (ref 15–41)
Albumin: 2.6 g/dL — ABNORMAL LOW (ref 3.5–5.0)
Alkaline Phosphatase: 57 U/L (ref 38–126)
Anion gap: 7 (ref 5–15)
BUN: <5 mg/dL — ABNORMAL LOW (ref 6–20)
CO2: 21 mmol/L — ABNORMAL LOW (ref 22–32)
Calcium: 8.3 mg/dL — ABNORMAL LOW (ref 8.9–10.3)
Chloride: 107 mmol/L (ref 98–111)
Creatinine, Ser: 0.55 mg/dL — ABNORMAL LOW (ref 0.61–1.24)
GFR, Estimated: >60 mL/min (ref >60)
Glucose, Bld: 105 mg/dL — ABNORMAL HIGH (ref 70–99)
Potassium: 3.6 mmol/L (ref 3.5–5.1)
Sodium: 135 mmol/L (ref 135–145)
Total Bilirubin: 0.7 mg/dL (ref <1.2)
Total Protein: 5.6 g/dL — ABNORMAL LOW (ref 6.5–8.1)

## 2023-10-27 LAB — MAGNESIUM: Magnesium: 1.7 mg/dL (ref 1.7–2.4)

## 2023-10-27 LAB — GLUCOSE, CAPILLARY
Glucose-Capillary: 113 mg/dL — ABNORMAL HIGH (ref 70–99)
Glucose-Capillary: 145 mg/dL — ABNORMAL HIGH (ref 70–99)

## 2023-10-27 LAB — PHOSPHORUS: Phosphorus: 4.3 mg/dL (ref 2.5–4.6)

## 2023-10-27 MED ORDER — HYDROMORPHONE HCL 1 MG/ML IJ SOLN: 0.5000 mg | Freq: Once | INTRAMUSCULAR | Status: DC

## 2023-10-27 MED ORDER — LORAZEPAM 0.5 MG PO TABS: 0.5000 mg | ORAL_TABLET | Freq: Three times a day (TID) | ORAL | Status: DC | PRN

## 2023-10-27 MED ORDER — LORAZEPAM 1 MG PO TABS
1.0000 mg | ORAL_TABLET | Freq: Three times a day (TID) | ORAL | Status: DC | PRN
  Administered 2023-10-27 (×2): 1 mg via ORAL
  Filled 2023-10-27 (×2): qty 1

## 2023-10-27 MED ORDER — OXYCODONE HCL 5 MG PO TABS
5.0000 mg | ORAL_TABLET | Freq: Four times a day (QID) | ORAL | Status: DC | PRN
  Filled 2023-10-27: qty 1

## 2023-10-27 MED ORDER — OXYCODONE HCL 5 MG PO TABS
5.0000 mg | ORAL_TABLET | Freq: Four times a day (QID) | ORAL | Status: DC | PRN
  Administered 2023-10-27 (×2): 10 mg via ORAL
  Administered 2023-10-28: 5 mg via ORAL
  Filled 2023-10-27 (×3): qty 2
  Filled 2023-10-27: qty 1

## 2023-10-27 NOTE — Progress Notes (Signed)
Physical Therapy Treatment Patient Details Name: Gary [name] MRN: [id number] DOB: [birth year] Today's Date: 10/27/2023   History of Present Illness Patient is a 37 year old male with recurrent acute alcoholic pancreatitis. History of ETOH, recurrent pancreatitis, HIV, seizure disorder, mesenteric thrombus with noncompliance with DOAC.    PT Comments  PT chart review completed prior to cancellation of PT orders so PT treatment performed. Pt is making steady progress, ambulat to transfer and ambulate without physical assistance or UE support of DME. Pt is mildly agitated, upset about lack of pain medication and meds to assist with withdrawal. Pt reports that is discharged he will return to the hospital every day if he has to in order to get resolution of his pancreatitis and withdrawal symptoms. MD made aware. If further PT services are warranted please re-consult.    If plan is discharge home, recommend the following: Assist for transportation   Can travel by private vehicle        Equipment Recommendations  None recommended by PT    Recommendations for Other Services       Precautions / Restrictions Precautions Precautions: Fall Restrictions Weight Bearing Restrictions Per Provider Order: No     Mobility  Bed Mobility Overal bed mobility: Needs Assistance Bed Mobility: Supine to Sit, Sit to Supine     Supine to sit: Modified independent (Device/Increase time) Sit to supine: Modified independent (Device/Increase time)        Transfers Overall transfer level: Needs assistance Equipment used: None Transfers: Sit to/from Stand Sit to Stand: Supervision                Ambulation/Gait Ambulation/Gait assistance: Supervision Gait Distance (Feet): 100 Feet Assistive device: None Gait Pattern/deviations: Step-through pattern Gait velocity: functional Gait velocity interpretation: 1.31 - 2.62 ft/sec, indicative of limited community ambulator   General Gait  Details: steady step-through gait, widened BOS   Stairs             Wheelchair Mobility     Tilt Bed    Modified Rankin (Stroke Patients Only)       Balance Overall balance assessment: Needs assistance Sitting-balance support: No upper extremity supported, Feet supported Sitting balance-Leahy Scale: Good     Standing balance support: No upper extremity supported, During functional activity Standing balance-Leahy Scale: Good                              Cognition Arousal: Alert Behavior During Therapy: Agitated Overall Cognitive Status: Within Functional Limits for tasks assessed                                 General Comments: pt appears mildly agitated about lack of pain meds, he also requests medications for withdrawal management        Exercises      General Comments General comments (skin integrity, edema, etc.): VSS on RA, HR in 80s during mobility      Pertinent Vitals/Pain Pain Assessment Pain Assessment: Faces Faces Pain Scale: Hurts even more Pain Location: abdomen Pain Descriptors / Indicators: Grimacing Pain Intervention(s): Monitored during session    Home Living                          Prior Function            PT Goals (current goals can now  be found in the care plan section) Acute Rehab PT Goals Patient Stated Goal: to quit alcohol for good Progress towards PT goals: Progressing toward goals    Frequency    Min 1X/week      PT Plan      Co-evaluation              AM-PAC PT "6 Clicks" Mobility   Outcome Measure  Help needed turning from your back to your side while in a flat bed without using bedrails?: None Help needed moving from lying on your back to sitting on the side of a flat bed without using bedrails?: None Help needed moving to and from a bed to a chair (including a wheelchair)?: None Help needed standing up from a chair using your arms (e.g., wheelchair or bedside  chair)?: None Help needed to walk in hospital room?: A Little Help needed climbing 3-5 steps with a railing? : A Little 6 Click Score: 22    End of Session Equipment Utilized During Treatment:  (pt refuses gait belt) Activity Tolerance: Patient tolerated treatment well Patient left: in bed;with call bell/phone within reach;with bed alarm set Nurse Communication: Mobility status PT Visit Diagnosis: Unsteadiness on feet (R26.81);Muscle weakness (generalized) (M62.81)     Time: 4098-1191 PT Time Calculation (min) (ACUTE ONLY): 17 min  Charges:    $Gait Training: 8-22 mins PT General Charges $$ ACUTE PT VISIT: 1 Visit                     Arlyss Gandy, PT, DPT Acute Rehabilitation Office [phone]    Arlyss Gandy 10/27/2023, 9:32 AM

## 2023-10-27 NOTE — Plan of Care (Signed)
Patient has been complaining of 9-10/10 all day.He refused to take oxycodone and wanted IV pain medication. He finally took oxycodone 10mg  at 3:54pm after I brought my manager into the room with me to explain why he needs to try PO medication vs IV. I have educated him all day about why he will not be receiving IV pain medication and the doctor has also been to his room. On my last round he told me "suicide will take my pain away since you all are not helping me". I asked him if he was having thoughts of suicide he said no but it would take his pain away. I made the MD aware of his outburst and behavior for today. He is stable and is not showing any signs of withdrawal requiring ativan other than being nauseous. Will continue to monitor

## 2023-10-27 NOTE — Progress Notes (Signed)
Gary [name]  XLK:[id number] DOB: November 07, 1986 DOA: 10/24/2023 PCP: Default, Provider, MD    Brief Narrative:  37 year old with a history of alcohol abuse and recurrent alcoholic pancreatitis, HIV, seizure disorder, mesenteric thrombus with noncompliance with anticoagulation, and multiple prior admissions for alcohol related pancreatitis with poor adherence to medications and general medical advice who returned to the hospital 10/24/2023 with complaints of severe abdominal pain with a lipase elevated at 595 and abdominal tenderness.  During his admission the patient has made multiple request for increasing doses of Ativan as well as narcotics.  He has reported withdrawal symptoms at times that no physiologic evidence of active withdrawal was apparent.  Goals of Care:   Code Status: Full Code   DVT prophylaxis: enoxaparin (LOVENOX) injection 40 mg Start: 10/25/23 1000  Interim Hx: The patient made multiple complaints yesterday after his IV Dilaudid was discontinued and the nighttime covering doctor provided him with 0.5 mg Dilaudid as a one-time administration.  He is afebrile with stable vitals.  Reports ongoing abdominal pain, but did tolerate clear liquids without apparent difficulty this morning.  He has been educated on the need to transition to oral pain medications and that returning to IV will not be an option as he is improving.  Assessment & Plan:  Recurrent acute alcoholic pancreatitis Patient has been counseled multiple times on absolute need to discontinue alcohol abuse and on directly between this ongoing abuse and his recurrent abdominal pain -continue chronic Creon -making progress with the ability to slowly advance diet  Alcohol abuse No objective evidence of significant withdrawal has been appreciated during this admission  Drug-seeking behavior Patient has made multiple requests for increasing doses of both benzodiazepines as well as narcotics -he is claimed to be  experiencing withdrawal symptoms at times when there is no evidence of true physiologic withdrawal -increasing doses of narcotics and benzodiazepines will only serve to harm this patient and should be avoided -transition to oral meds only for both benzos and narcotics today with plan to continue to wean benzos  HIV CD4 count fortunately is 566 with a HIV-1 RNA quant of 4520, with both numbers improved from 3 months prior suggesting patient has likely been compliant with meds for this diagnosis  Seizure disorder Continue usual home AEDs  Hypokalemia Corrected with supplementation and due to alcoholism  Hypomagnesemia Corrected with supplementation and due to alcoholism  Hypoglycemia In setting of acute pancreatitis with poor oral intake -resolved  Homelessness TOC following   Family Communication: No family present at time of exam Disposition: Anticipate medical readiness for discharge in next 24-48 hours   Objective: Blood pressure 91/63, pulse (!) 58, temperature 98.2 F (36.8 C), temperature source Oral, resp. rate 17, height 6' (1.829 m), weight 68 kg, SpO2 98%.  Intake/Output Summary (Last 24 hours) at 10/27/2023 0843 Last data filed at 10/27/2023 0618 Gross per 24 hour  Intake 1321.52 ml  Output 3750 ml  Net -2428.48 ml   Filed Weights   10/24/23 2019  Weight: 68 kg    Examination: General: No acute respiratory distress Lungs: Clear to auscultation bilaterally without wheezes or crackles Cardiovascular: Regular rate and rhythm without murmur gallop or rub normal S1 and S2 Abdomen: Nontender to distracted exam, nondistended, soft, bowel sounds positive, no rebound, no ascites, no appreciable mass Extremities: No significant cyanosis, clubbing, or edema bilateral lower extremities  CBC: Recent Labs  Lab 10/24/23 2145 10/25/23 0430 10/26/23 0938 10/27/23 0637  WBC 6.6 4.8 2.9* 2.9*  NEUTROABS 3.3  --  1.5* 1.2*  HGB 13.5 11.3* 10.7* 11.5*  HCT 40.4 33.8*  32.2* 34.7*  MCV 92.9 93.4 93.6 94.6  PLT 715* 474* 338 287   Basic Metabolic Panel: Recent Labs  Lab 10/25/23 0430 10/26/23 0938 10/27/23 0637  NA 142 134* 135  K 3.4* 3.4* 3.6  CL 108 101 107  CO2 30 27 21*  GLUCOSE 67* 167* 105*  BUN <5* <5* <5*  CREATININE 0.50* 0.58* 0.55*  CALCIUM 7.7* 8.6* 8.3*  MG 1.3*  --  1.7  PHOS 3.1  --  4.3   GFR: Estimated Creatinine Clearance: 121.6 mL/min (A) (by C-G formula based on SCr of 0.55 mg/dL (L)).   Scheduled Meds:  bictegravir-emtricitabine-tenofovir AF  1 tablet Oral Daily   enoxaparin (LOVENOX) injection  40 mg Subcutaneous Q24H   folic acid  1 mg Oral Daily   levETIRAcetam  500 mg Oral BID   lipase/protease/amylase  36,000 Units Oral TID AC   LORazepam  2 mg Intravenous Q8H   multivitamin with minerals  1 tablet Oral Daily   pantoprazole  40 mg Oral Daily   thiamine  100 mg Oral Daily   Or   thiamine  100 mg Intravenous Daily   Continuous Infusions:  dextrose 5 % and 0.45 % NaCl 100 mL/hr at 10/26/23 2350     LOS: 3 days   Lonia Blood, MD [facility] Office  [phone] Pager - Text Page per Loretha Stapler  If 7PM-7AM, please contact night-coverage per Amion 10/27/2023, 8:43 AM

## 2023-10-27 NOTE — Progress Notes (Signed)
Mobility Specialist Progress Note:   10/27/23 1353  Mobility  Activity Ambulated with assistance in room  Level of Assistance Contact guard assist, steadying assist  Assistive Device Other (Comment) (furniture walking)  Distance Ambulated (ft) 20 ft  Activity Response Tolerated well  Mobility Referral Yes  Mobility visit 1 Mobility  Mobility Specialist Start Time (ACUTE ONLY) 1340  Mobility Specialist Stop Time (ACUTE ONLY) 1350  Mobility Specialist Time Calculation (min) (ACUTE ONLY) 10 min   Pt received in bed, required encouragement to participate. Reports the following symptoms: feeling cold, hot, itchy, nauseous, and pain. Pt agreeable to ambulate in room, labored and slow cadence. Required CGA for safety, however no physical assistance or device required to stand or ambulate in room. Pt demanding pain meds throughout session. Pt making comments threatening to sue if they are d/c w/o pain medication. Returned pt to bed, NT in room to take vitals, left with all needs met.   Feliciana Rossetti Mobility Specialist Please contact via Special educational needs teacher or  Rehab office at [phone]

## 2023-10-28 ENCOUNTER — Other Ambulatory Visit: Payer: Self-pay

## 2023-10-28 ENCOUNTER — Emergency Department (HOSPITAL_COMMUNITY)
Admission: EM | Admit: 2023-10-28 | Discharge: 2023-10-29 | Disposition: A | Discharge status: Home / Self Care | Payer: MEDICAID | Attending: Emergency Medicine | Admitting: Emergency Medicine

## 2023-10-28 ENCOUNTER — Emergency Department (HOSPITAL_COMMUNITY): Payer: MEDICAID

## 2023-10-28 DIAGNOSIS — Z21 Asymptomatic human immunodeficiency virus [HIV] infection status: Secondary | ICD-10-CM | POA: Insufficient documentation

## 2023-10-28 DIAGNOSIS — R109 Unspecified abdominal pain: Secondary | ICD-10-CM | POA: Diagnosis present

## 2023-10-28 DIAGNOSIS — F17210 Nicotine dependence, cigarettes, uncomplicated: Secondary | ICD-10-CM | POA: Diagnosis not present

## 2023-10-28 DIAGNOSIS — K852 Alcohol induced acute pancreatitis without necrosis or infection: Secondary | ICD-10-CM | POA: Diagnosis not present

## 2023-10-28 LAB — MAGNESIUM: Magnesium: 2.2 mg/dL (ref 1.7–2.4)

## 2023-10-28 LAB — COMPREHENSIVE METABOLIC PANEL
ALT: 20 U/L (ref 0–44)
AST: 23 U/L (ref 15–41)
Albumin: 3.6 g/dL (ref 3.5–5.0)
Alkaline Phosphatase: 67 U/L (ref 38–126)
Anion gap: 9 (ref 5–15)
BUN: <5 mg/dL — ABNORMAL LOW (ref 6–20)
CO2: 24 mmol/L (ref 22–32)
Calcium: 9.2 mg/dL (ref 8.9–10.3)
Chloride: 104 mmol/L (ref 98–111)
Creatinine, Ser: 0.67 mg/dL (ref 0.61–1.24)
GFR, Estimated: >60 mL/min (ref >60)
Glucose, Bld: 124 mg/dL — ABNORMAL HIGH (ref 70–99)
Potassium: 3.2 mmol/L — ABNORMAL LOW (ref 3.5–5.1)
Sodium: 137 mmol/L (ref 135–145)
Total Bilirubin: 0.4 mg/dL (ref <1.2)
Total Protein: 7.4 g/dL (ref 6.5–8.1)

## 2023-10-28 LAB — CBC WITH DIFFERENTIAL/PLATELET
Abs Immature Granulocytes: 0.03 10*3/uL (ref 0.00–0.07)
Basophils Absolute: 0.1 10*3/uL (ref 0.0–0.1)
Basophils Relative: 1 %
Eosinophils Absolute: 0 10*3/uL (ref 0.0–0.5)
Eosinophils Relative: 0 %
HCT: 39 % (ref 39.0–52.0)
Hemoglobin: 13.3 g/dL (ref 13.0–17.0)
Immature Granulocytes: 0 %
Lymphocytes Relative: 22 %
Lymphs Abs: 1.6 10*3/uL (ref 0.7–4.0)
MCH: 32 pg (ref 26.0–34.0)
MCHC: 34.1 g/dL (ref 30.0–36.0)
MCV: 94 fL (ref 80.0–100.0)
Monocytes Absolute: 0.3 10*3/uL (ref 0.1–1.0)
Monocytes Relative: 4 %
Neutro Abs: 5.2 10*3/uL (ref 1.7–7.7)
Neutrophils Relative %: 73 %
Platelets: 381 10*3/uL (ref 150–400)
RBC: 4.15 MIL/uL — ABNORMAL LOW (ref 4.22–5.81)
RDW: 14.9 % (ref 11.5–15.5)
WBC: 7.2 10*3/uL (ref 4.0–10.5)
nRBC: 0 % (ref 0.0–0.2)

## 2023-10-28 LAB — ETHANOL: Alcohol, Ethyl (B): 192 mg/dL — ABNORMAL HIGH (ref <10)

## 2023-10-28 LAB — GLUCOSE, CAPILLARY: Glucose-Capillary: 90 mg/dL (ref 70–99)

## 2023-10-28 LAB — LIPASE, BLOOD: Lipase: 97 U/L — ABNORMAL HIGH (ref 11–51)

## 2023-10-28 MED ORDER — LACTATED RINGERS IV BOLUS
500.0000 mL | Freq: Once | INTRAVENOUS | Status: AC
  Administered 2023-10-28: 500 mL via INTRAVENOUS

## 2023-10-28 MED ORDER — ENOXAPARIN SODIUM 40 MG/0.4ML IJ SOSY
40.0000 mg | PREFILLED_SYRINGE | INTRAMUSCULAR | Status: DC
  Filled 2023-10-28: qty 0.4

## 2023-10-28 MED ORDER — LORAZEPAM 1 MG PO TABS
1.0000 mg | ORAL_TABLET | Freq: Two times a day (BID) | ORAL | Status: DC | PRN
  Administered 2023-10-28: 1 mg via ORAL
  Filled 2023-10-28: qty 1

## 2023-10-28 MED ORDER — SODIUM CHLORIDE 0.9 % IV BOLUS
1000.0000 mL | Freq: Once | INTRAVENOUS | Status: AC
  Administered 2023-10-28: 1000 mL via INTRAVENOUS

## 2023-10-28 MED ORDER — OXYCODONE-ACETAMINOPHEN 5-325 MG PO TABS
2.0000 | ORAL_TABLET | Freq: Once | ORAL | Status: AC
Start: 2023-10-28 — End: 2023-10-28
  Administered 2023-10-28: 2 via ORAL
  Filled 2023-10-28: qty 2

## 2023-10-28 MED ORDER — ONDANSETRON HCL 4 MG/2ML IJ SOLN
4.0000 mg | Freq: Once | INTRAMUSCULAR | Status: AC
  Administered 2023-10-28: 4 mg via INTRAVENOUS
  Filled 2023-10-28: qty 2

## 2023-10-28 MED ORDER — IOHEXOL 300 MG/ML  SOLN
100.0000 mL | Freq: Once | INTRAMUSCULAR | Status: AC | PRN
  Administered 2023-10-28: 100 mL via INTRAVENOUS

## 2023-10-28 NOTE — ED Triage Notes (Signed)
Pt arrived via POV. C/o abd pain that started yesterday. Pt is a heavy drinker and states they drank today after leaving [facility].   Hx of abd pain, pancreatitis, and ETOH abuse.  Aox4

## 2023-10-28 NOTE — ED Provider Notes (Signed)
[place] EMERGENCY DEPARTMENT AT [facility] Provider Note  CSN: [id number] Arrival date & time: 10/28/23 1750  Chief Complaint(s) Abdominal Pain  HPI Gary [name] is a 37 y.o. male history of alcohol use disorder, HIV, chronic pancreatitis, schizophrenia presenting to the emergency department with abdominal pain.  Patient was recently admitted to the hospital for pancreatitis.  He has had numerous previous presentations for similar.  He left AMA today because they would not give him IV pain medication.  He reports he left and then drink alcohol.  He returns because he is having continued abdominal pain.  Reports radiates to the back.  Reports associated nausea and vomiting.  No chest pain or shortness of breath.  No diarrhea.  No lightheadedness or dizziness.  No fainting.   Past Medical History Past Medical History:  Diagnosis Date   Alcohol abuse    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   Anxiety    Bipolar 2 disorder (HCC)    HIV (human immunodeficiency virus infection) (HCC)    Pancreatitis    PTSD (post-traumatic stress disorder)    Schizophrenia (HCC)    Seizures (HCC)    Subdural hematoma (HCC)    Patient Active Problem List   Diagnosis Date Noted   Pancreatitis 10/16/2023   Acute on chronic pancreatitis (HCC) 10/16/2023   Recurrent acute pancreatitis 10/09/2023   Macrocytic anemia 09/26/2023   Alcoholic fatty liver 09/25/2023   Protein-calorie malnutrition, severe 12/25/2022   Hypomagnesemia 12/24/2022   Alcohol withdrawal syndrome without complication (HCC) 12/23/2022   Seizure disorder (HCC) 12/17/2022   Pancytopenia (HCC) 12/14/2022   Alcohol-induced pancreatitis 12/14/2022   Delirium tremens (HCC) 10/19/2022   Alcohol abuse with alcohol-induced mood disorder (HCC) 10/16/2022   Alcohol dependence with withdrawal (HCC) 10/16/2022   Abnormal LFTs 10/16/2022   Acute alcoholic pancreatitis 09/11/2022   AKI (acute kidney injury) (HCC) 05/28/2022    Homelessness 05/28/2022   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   Metabolic acidosis with increased anion gap and accumulation of organic acids 04/16/2022   Hypokalemia 01/22/2022   Alcohol-induced insomnia (HCC)    MDD (major depressive disorder), recurrent episode, 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/26/2021   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   Alcohol abuse    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   Malingering 05/15/2020   Malnutrition of moderate degree 05/07/2020   Alcoholic ketoacidosis 05/05/2020   Thrombocytopenia (HCC) 07/25/2019   Traumatic subdural hematoma (HCC) 07/02/2019   Alcoholic intoxication with complication (HCC) 07/02/2019   Hypocalcemia 07/02/2019   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   Alcohol use disorder, severe, dependence (HCC) 04/16/2019   Major depressive disorder, 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 Authorizing Provider  bictegravir-emtricitabine-tenofovir AF (BIKTARVY) 50-200-25 MG TABS tablet Take 1 tablet by mouth daily.    [provider]  levETIRAcetam (KEPPRA) 500 MG tablet Take 1 tablet (500 mg total) by mouth 2 (two) times daily. 10/20/23 11/19/23  Dorcas Carrow, MD  lipase/protease/amylase (CREON) 36000 UNITS CPEP capsule Take 1 capsule (36,000 Units total) by mouth 3 (three) times daily before meals. 10/20/23   Dorcas Carrow, MD  pantoprazole (PROTONIX) 40 MG tablet Take 1 tablet (40 mg total) by mouth daily. 10/20/23   Dorcas Carrow, MD  famotidine (PEPCID) 20 MG tablet Take 1 tablet (20 mg total) by mouth 2 (  two) times daily. Patient not taking: Reported on 06/01/2019 05/14/19 06/02/19  Benjiman Core, MD                                                                                                                                     Past Surgical History Past Surgical History:  Procedure Laterality Date   BIOPSY  04/19/2022   Procedure: BIOPSY;  Surgeon: Meridee Score Netty Starring., MD;  Location: [facility] ENDOSCOPY;  Service: Gastroenterology;;   ENTEROSCOPY N/A 04/19/2022   Procedure: ENTEROSCOPY;  Surgeon: Meridee Score Netty Starring., MD;  Location: [facility] ENDOSCOPY;  Service: Gastroenterology;  Laterality: N/A;   INCISION AND DRAINAGE PERIRECTAL ABSCESS N/A 09/24/2016   Procedure: IRRIGATION AND DEBRIDEMENT PERIRECTAL ABSCESS;  Surgeon: Ricarda Frame, MD;  Location: [facility] ORS;  Service: General;  Laterality: N/A;   none     Family History Family History  Problem Relation Age of Onset   Alcohol abuse Mother    Alcohol abuse Father    Colon cancer Other    Other Other    Cancer Other     Social History Social History   Tobacco Use   Smoking status: Every Day    Current packs/day: 0.50    Average packs/day: 0.5 packs/day for 21.0 years (10.5 ttl pk-yrs)    Types: Cigarettes    Start date: 2004   Smokeless tobacco: Never   Tobacco comments:    unable to smoke while incarcerated 6+ months 02/13/20  Vaping Use   Vaping status: Never Used  Substance Use Topics   Alcohol use: Yes    Alcohol/week: 105.0 standard drinks of alcohol    Types: 105 Cans of beer per week    Comment: drinks every day, all day, "whatever I can get my hands on". "Half a gallon of vodka" 12/16   Drug use: Yes    Types: Methamphetamines, Cocaine    Comment: last used 10/22/2023   Allergies Tegretol [carbamazepine] and Caffeine  Review of Systems Review of Systems  All other systems reviewed and are negative.   Physical Exam Vital Signs  I have reviewed the triage vital signs BP 110/80 (BP Location: Left Arm)   Pulse (!) 106   Temp 98.3 F (36.8 C) (Oral)   Resp 18   Ht 6' (1.829 m)   Wt 68 kg   SpO2 100%   BMI 20.34 kg/m  Physical Exam Vitals and nursing  note reviewed.  Constitutional:      General: He is not in acute distress.    Appearance: Normal appearance.  HENT:     Mouth/Throat:     Mouth: Mucous membranes are moist.  Eyes:     Conjunctiva/sclera: Conjunctivae normal.  Cardiovascular:     Rate and Rhythm: Normal rate and regular rhythm.  Pulmonary:     Effort: Pulmonary effort is normal. No respiratory distress.     Breath sounds: Normal breath sounds.  Abdominal:  General: Abdomen is flat.     Palpations: Abdomen is soft.     Tenderness: There is abdominal tenderness in the epigastric area.  Musculoskeletal:     Right lower leg: No edema.     Left lower leg: No edema.  Skin:    General: Skin is warm and dry.     Capillary Refill: Capillary refill takes less than 2 seconds.  Neurological:     Mental Status: He is alert and oriented to person, place, and time. Mental status is at baseline.  Psychiatric:        Mood and Affect: Mood normal.        Behavior: Behavior normal.     ED Results and Treatments Labs (all labs ordered are listed, but only abnormal results are displayed) Labs Reviewed  CBC WITH DIFFERENTIAL/PLATELET - Abnormal; Notable for the following components:      Result Value   RBC 4.15 (*)    All other components within normal limits  COMPREHENSIVE METABOLIC PANEL - Abnormal; Notable for the following components:   Potassium 3.2 (*)    Glucose, Bld 124 (*)    BUN <5 (*)    All other components within normal limits  LIPASE, BLOOD - Abnormal; Notable for the following components:   Lipase 97 (*)    All other components within normal limits  ETHANOL - Abnormal; Notable for the following components:   Alcohol, Ethyl (B) 192 (*)    All other components within normal limits  MAGNESIUM  URINALYSIS, W/ REFLEX TO CULTURE (INFECTION SUSPECTED)                                                                                                                          Radiology No results  found.  Pertinent labs & imaging results that were available during my care of the patient were reviewed by me and considered in my medical decision making (see MDM for details).  Medications Ordered in ED Medications  oxyCODONE-acetaminophen (PERCOCET/ROXICET) 5-325 MG per tablet 2 tablet (2 tablets Oral Given 10/28/23 2133)  ondansetron (ZOFRAN) injection 4 mg (4 mg Intravenous Given 10/28/23 2133)  sodium chloride 0.9 % bolus 1,000 mL (1,000 mLs Intravenous New Bag/Given 10/28/23 2134)  iohexol (OMNIPAQUE) 300 MG/ML solution 100 mL (100 mLs Intravenous Contrast Given 10/28/23 2220)  Procedures Procedures  (including critical care time)  Medical Decision Making / ED Course   MDM:  37 year old male presenting to the emergency department with abdominal pain.  Patient overall well-appearing, no acute distress.  Does have epigastric tenderness.  Suspect ongoing alcoholic pancreatitis.  Did seem to have evolving pancreatitis on recent imaging.  Has not had recent CT scan, will obtain to evaluate for any acute process.  His lipase is improved from recent hospitalization.  He left AMA because they would not give him IV pain medication.  I discussed with the patient that we would have to try oral pain medication prior to consideration of IV pain medication.  Will reassess after CT scan.  Clinical Course as of 10/28/23 2309  Thu Oct 28, 2023  2307 Signed out to Dr. Wilkie Aye pending CT scan results and re-assessment.  [WS]    Clinical Course User Index [WS] Lonell Grandchild, MD     Additional history obtained: -External records from outside source obtained and reviewed including: Chart review including previous notes, labs, imaging, consultation notes including recent admission and d/c summary    Lab Tests: -I ordered, reviewed, and interpreted labs.    The pertinent results include:   Labs Reviewed  CBC WITH DIFFERENTIAL/PLATELET - Abnormal; Notable for the following components:      Result Value   RBC 4.15 (*)    All other components within normal limits  COMPREHENSIVE METABOLIC PANEL - Abnormal; Notable for the following components:   Potassium 3.2 (*)    Glucose, Bld 124 (*)    BUN <5 (*)    All other components within normal limits  LIPASE, BLOOD - Abnormal; Notable for the following components:   Lipase 97 (*)    All other components within normal limits  ETHANOL - Abnormal; Notable for the following components:   Alcohol, Ethyl (B) 192 (*)    All other components within normal limits  MAGNESIUM  URINALYSIS, W/ REFLEX TO CULTURE (INFECTION SUSPECTED)    Notable for mild hypokalemia. Elevated alcohol level   Imaging Studies ordered: I ordered imaging studies including CT abdomen On my interpretation imaging demonstrates pancreatitis  Radiology interpretation pending    Medicines ordered and prescription drug management: Meds ordered this encounter  Medications   oxyCODONE-acetaminophen (PERCOCET/ROXICET) 5-325 MG per tablet 2 tablet    Refill:  0   ondansetron (ZOFRAN) injection 4 mg   sodium chloride 0.9 % bolus 1,000 mL   iohexol (OMNIPAQUE) 300 MG/ML solution 100 mL    -I have reviewed the patients home medicines and have made adjustments as needed  Social Determinants of Health:  Diagnosis or treatment significantly limited by social determinants of health: alcohol use   Reevaluation: After the interventions noted above, I reevaluated the patient and found that their symptoms have improved  Co morbidities that complicate the patient evaluation  Past Medical History:  Diagnosis Date   Alcohol abuse    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   Anxiety    Bipolar 2 disorder (HCC)    HIV (human immunodeficiency virus infection) (HCC)    Pancreatitis    PTSD (post-traumatic stress disorder)     Schizophrenia (HCC)    Seizures (HCC)    Subdural hematoma (HCC)       Dispostion: Disposition decision including need for hospitalization was considered, and patient disposition pending at time of sign out.    Final Clinical Impression(s) / ED Diagnoses Final diagnoses:  Alcohol-induced acute pancreatitis, unspecified complication status     This  chart was dictated using voice recognition software.  Despite best efforts to proofread,  errors can occur which can change the documentation meaning.    Lonell Grandchild, MD 10/28/23 [phone]

## 2023-10-28 NOTE — Discharge Summary (Addendum)
PT LEFT AMA - DISCHARGE SUMMARY  Gary [name] MRN - [id number] DOB - [birth year]  Date of Admission - 10/24/2023 Date LEFT AMA: 10/28/2023  Attending Physician:  Lonia Blood  Patient's PCP:  Default, Provider, MD  Disposition: LEFT AMA  Follow-up Appts:  Not able to be arranged or discussed as pt LEFT AMA  Diagnoses at time pt LEFT AMA: Recurrent acute alcoholic pancreatitis Alcohol abuse Drug-seeking behavior - narcotics and benzos  HIV+ Seizure disorder Hypokalemia Hypomagnesemia Hypoglycemia Homelessness  Initial presentation: 37 year old with a history of alcohol abuse and recurrent alcoholic pancreatitis, HIV, seizure disorder, mesenteric thrombus with noncompliance with anticoagulation, and multiple prior admissions for alcohol related pancreatitis with poor adherence to medications and general medical advice who returned to the hospital 10/24/2023 with complaints of severe abdominal pain with a lipase elevated at 595 and abdominal tenderness.   During his admission the patient has made multiple request for increasing doses of Ativan as well as narcotics.  He has reported withdrawal symptoms at times that no physiologic evidence of active withdrawal was apparent.  Hospital Course: Listed below are the active problems present, and the status of the care of these problems, at the time the pt decided to LEAVE AMA:  Recurrent acute alcoholic pancreatitis Patient has been counseled multiple times on absolute need to discontinue alcohol abuse and on direct link between this ongoing abuse and his recurrent abdominal pain -continue chronic Creon -advance diet again and if tolerates will begin to plan for discharge   Alcohol abuse No objective evidence of significant withdrawal has been appreciated during this admission -remained stable in this regard -as noted above has been counseled on absolute need to stop alcohol   Drug-seeking behavior Patient has made  multiple requests for increasing doses of both benzodiazepines as well as narcotics -he reports to be experiencing withdrawal symptoms at times when there is no evidence of true physiologic withdrawal - he has frequently threatened to leave AMA, to "sue the whole hospital," or even threatens self harm (then later admits he is not serious) if not given the narcotics he desires - doses of narcotics and benzodiazepines will only serve to harm this patient and should be avoided - transitioned to oral meds only for both benzos and narcotics 12/18 with plan to continue to wean both as further objective improvement is noted    HIV CD4 count is 566 with a HIV-1 RNA quant of 4520, with both numbers improved from 3 months prior suggesting patient has likely been compliant with meds for this diagnosis   Seizure disorder Continue usual home AEDs   Hypokalemia Corrected with supplementation - due to alcoholism   Hypomagnesemia Corrected with supplementation - due to alcoholism   Hypoglycemia In setting of acute pancreatitis with poor oral intake -resolved with improving oral intake   Homelessness TOC following     Medication List    Unable to be finalized as pt LEFT AMA  Day of Discharge Wt Readings from Last 3 Encounters:  10/24/23 68 kg  10/16/23 62.2 kg  10/15/23 62.2 kg   Temp Readings from Last 3 Encounters:  10/28/23 98.8 F (37.1 C) (Oral)  10/20/23 98 F (36.7 C)  10/15/23 98.5 F (36.9 C) (Oral)   BP Readings from Last 3 Encounters:  10/28/23 99/60  10/20/23 97/63  10/15/23 111/74   Pulse Readings from Last 3 Encounters:  10/28/23 (!) 58  10/20/23 60  10/15/23 71    Physical Exam: Exam not able to be completed  at time of d/c as pt LEFT AMA  3:14 PM 10/28/23  Lonia Blood, MD [facility] Office  [phone]

## 2023-10-28 NOTE — Progress Notes (Signed)
Patient is leaving AMA. He stated that no one is helping him. He is going to leave and get a drink to relieve his pain since we will not give him IV pain medication.

## 2023-10-28 NOTE — ED Provider Triage Note (Signed)
Emergency Medicine Provider Triage Evaluation Note  JAHMALI HINNANT , a 37 y.o. male  was evaluated in triage.  Pt complains of abd pain, emesis. Left AMA from admission for etoh withdrawal and etoh pancreatitis. States he drank etoh after leaving the hospital. Has pain now to abd. States feels consistent with prior pancreatitis  Review of Systems  Positive: Epigastric pain, emesis Negative:   Physical Exam  BP 111/81   Pulse (!) 112   Temp 97.7 F (36.5 C) (Oral)   Ht 6' (1.829 m)   Wt 68 kg   SpO2 100%   BMI 20.34 kg/m  Gen:   Awake, no distress   Resp:  Normal effort  MSK:   Moves extremities without difficulty  Other:    Medical Decision Making  Medically screening exam initiated at 5:58 PM.  Appropriate orders placed.  GADGE GRGURICH was informed that the remainder of the evaluation will be completed by another provider, this initial triage assessment does not replace that evaluation, and the importance of remaining in the ED until their evaluation is complete.  Abd pain   Verlia Kaney A, PA-C 10/28/23 1759

## 2023-10-28 NOTE — Progress Notes (Signed)
Mobility Specialist Progress Note:   10/28/23 1148  Mobility  Activity Ambulated independently in room  Level of Assistance Standby assist, set-up cues, supervision of patient - no hands on  Assistive Device None  Distance Ambulated (ft) 15 ft  Activity Response Tolerated well  Mobility Referral Yes  Mobility visit 1 Mobility  Mobility Specialist Start Time (ACUTE ONLY) 1145  Mobility Specialist Stop Time (ACUTE ONLY) 1148  Mobility Specialist Time Calculation (min) (ACUTE ONLY) 3 min   Responded to pt bed alarm. Pt is ambulating independently in room, currently collecting belongings to leave hospital stating "[he's] leaving since nobody is giving him pain medication."  Notified RN. Left with all needs met.   Feliciana Rossetti Mobility Specialist Please contact via Special educational needs teacher or  Rehab office at [phone]

## 2023-10-29 ENCOUNTER — Other Ambulatory Visit (HOSPITAL_COMMUNITY): Payer: Self-pay

## 2023-10-29 ENCOUNTER — Other Ambulatory Visit: Payer: Self-pay

## 2023-10-29 LAB — URINALYSIS, W/ REFLEX TO CULTURE (INFECTION SUSPECTED)
Bacteria, UA: NONE SEEN
Bilirubin Urine: NEGATIVE
Glucose, UA: NEGATIVE mg/dL
Hgb urine dipstick: NEGATIVE
Ketones, ur: NEGATIVE mg/dL
Leukocytes,Ua: NEGATIVE
Nitrite: NEGATIVE
Protein, ur: NEGATIVE mg/dL
pH: 5 (ref 5.0–8.0)

## 2023-10-29 MED ORDER — SUCRALFATE 1 G PO TABS
1.0000 g | ORAL_TABLET | Freq: Three times a day (TID) | ORAL | 0 refills | Status: DC
  Filled 2023-10-29: qty 120, 30d supply, fill #0

## 2023-10-29 NOTE — Discharge Instructions (Addendum)
You were seen today for ongoing pancreatitis symptoms.  Your CT scan actually looks slightly improved.  However if you continue to drink alcohol you will continue to have issues with ongoing pain and pancreatitis symptoms.  I highly suggest that you seek resources for detox of alcohol.  These were provided.  Continue your Protonix and Creon.  Carafate will be added.

## 2023-10-31 ENCOUNTER — Inpatient Hospital Stay (HOSPITAL_COMMUNITY)
Admission: EM | Admit: 2023-10-31 | Discharge: 2023-11-06 | DRG: 439 | Disposition: A | Discharge status: Home / Self Care | Payer: MEDICAID | Attending: Internal Medicine | Admitting: Internal Medicine

## 2023-10-31 ENCOUNTER — Other Ambulatory Visit: Payer: Self-pay

## 2023-10-31 DIAGNOSIS — D649 Anemia, unspecified: Secondary | ICD-10-CM | POA: Diagnosis present

## 2023-10-31 DIAGNOSIS — G8929 Other chronic pain: Secondary | ICD-10-CM | POA: Diagnosis present

## 2023-10-31 DIAGNOSIS — F10920 Alcohol use, unspecified with intoxication, uncomplicated: Secondary | ICD-10-CM | POA: Diagnosis not present

## 2023-10-31 DIAGNOSIS — F4310 Post-traumatic stress disorder, unspecified: Secondary | ICD-10-CM | POA: Diagnosis present

## 2023-10-31 DIAGNOSIS — Z765 Malingerer [conscious simulation]: Secondary | ICD-10-CM | POA: Diagnosis not present

## 2023-10-31 DIAGNOSIS — Z21 Asymptomatic human immunodeficiency virus [HIV] infection status: Secondary | ICD-10-CM | POA: Diagnosis not present

## 2023-10-31 DIAGNOSIS — F10939 Alcohol use, unspecified with withdrawal, unspecified: Secondary | ICD-10-CM

## 2023-10-31 DIAGNOSIS — R000 Tachycardia, unspecified: Secondary | ICD-10-CM | POA: Diagnosis present

## 2023-10-31 DIAGNOSIS — B20 Human immunodeficiency virus [HIV] disease: Secondary | ICD-10-CM | POA: Diagnosis present

## 2023-10-31 DIAGNOSIS — K700 Alcoholic fatty liver: Secondary | ICD-10-CM | POA: Diagnosis present

## 2023-10-31 DIAGNOSIS — K859 Acute pancreatitis without necrosis or infection, unspecified: Secondary | ICD-10-CM | POA: Diagnosis not present

## 2023-10-31 DIAGNOSIS — Z79899 Other long term (current) drug therapy: Secondary | ICD-10-CM

## 2023-10-31 DIAGNOSIS — F1010 Alcohol abuse, uncomplicated: Secondary | ICD-10-CM | POA: Diagnosis not present

## 2023-10-31 DIAGNOSIS — Z87891 Personal history of nicotine dependence: Secondary | ICD-10-CM

## 2023-10-31 DIAGNOSIS — F10929 Alcohol use, unspecified with intoxication, unspecified: Secondary | ICD-10-CM | POA: Diagnosis not present

## 2023-10-31 DIAGNOSIS — F209 Schizophrenia, unspecified: Secondary | ICD-10-CM | POA: Diagnosis present

## 2023-10-31 DIAGNOSIS — F419 Anxiety disorder, unspecified: Secondary | ICD-10-CM | POA: Diagnosis present

## 2023-10-31 DIAGNOSIS — F3181 Bipolar II disorder: Secondary | ICD-10-CM | POA: Diagnosis present

## 2023-10-31 DIAGNOSIS — Z87820 Personal history of traumatic brain injury: Secondary | ICD-10-CM

## 2023-10-31 DIAGNOSIS — F1020 Alcohol dependence, uncomplicated: Secondary | ICD-10-CM | POA: Diagnosis not present

## 2023-10-31 DIAGNOSIS — Z5900 Homelessness unspecified: Secondary | ICD-10-CM | POA: Diagnosis not present

## 2023-10-31 DIAGNOSIS — R112 Nausea with vomiting, unspecified: Secondary | ICD-10-CM | POA: Diagnosis present

## 2023-10-31 DIAGNOSIS — Y908 Blood alcohol level of 240 mg/100 ml or more: Secondary | ICD-10-CM | POA: Diagnosis present

## 2023-10-31 DIAGNOSIS — Z811 Family history of alcohol abuse and dependence: Secondary | ICD-10-CM

## 2023-10-31 DIAGNOSIS — Z91148 Patient's other noncompliance with medication regimen for other reason: Secondary | ICD-10-CM

## 2023-10-31 DIAGNOSIS — J690 Pneumonitis due to inhalation of food and vomit: Secondary | ICD-10-CM | POA: Diagnosis not present

## 2023-10-31 DIAGNOSIS — Z888 Allergy status to other drugs, medicaments and biological substances status: Secondary | ICD-10-CM

## 2023-10-31 DIAGNOSIS — G40909 Epilepsy, unspecified, not intractable, without status epilepticus: Secondary | ICD-10-CM | POA: Diagnosis present

## 2023-10-31 DIAGNOSIS — Z800 Family history of malignant neoplasm of digestive organs: Secondary | ICD-10-CM | POA: Diagnosis not present

## 2023-10-31 DIAGNOSIS — K852 Alcohol induced acute pancreatitis without necrosis or infection: Principal | ICD-10-CM | POA: Diagnosis present

## 2023-10-31 DIAGNOSIS — K5900 Constipation, unspecified: Secondary | ICD-10-CM | POA: Diagnosis present

## 2023-10-31 DIAGNOSIS — F10229 Alcohol dependence with intoxication, unspecified: Secondary | ICD-10-CM | POA: Diagnosis present

## 2023-10-31 DIAGNOSIS — I959 Hypotension, unspecified: Secondary | ICD-10-CM | POA: Diagnosis present

## 2023-10-31 DIAGNOSIS — F10239 Alcohol dependence with withdrawal, unspecified: Secondary | ICD-10-CM | POA: Diagnosis present

## 2023-10-31 DIAGNOSIS — E8729 Other acidosis: Secondary | ICD-10-CM | POA: Diagnosis present

## 2023-10-31 DIAGNOSIS — K861 Other chronic pancreatitis: Secondary | ICD-10-CM | POA: Diagnosis not present

## 2023-10-31 DIAGNOSIS — R748 Abnormal levels of other serum enzymes: Secondary | ICD-10-CM | POA: Diagnosis present

## 2023-10-31 DIAGNOSIS — E872 Acidosis, unspecified: Secondary | ICD-10-CM | POA: Diagnosis present

## 2023-10-31 DIAGNOSIS — K860 Alcohol-induced chronic pancreatitis: Secondary | ICD-10-CM | POA: Diagnosis present

## 2023-10-31 DIAGNOSIS — J189 Pneumonia, unspecified organism: Secondary | ICD-10-CM | POA: Diagnosis not present

## 2023-10-31 LAB — CBC WITH DIFFERENTIAL/PLATELET
Abs Immature Granulocytes: 0.06 10*3/uL (ref 0.00–0.07)
Basophils Absolute: 0.1 10*3/uL (ref 0.0–0.1)
Basophils Relative: 1 %
Eosinophils Absolute: 0 10*3/uL (ref 0.0–0.5)
Eosinophils Relative: 0 %
HCT: 38.6 % — ABNORMAL LOW (ref 39.0–52.0)
Hemoglobin: 12.3 g/dL — ABNORMAL LOW (ref 13.0–17.0)
Immature Granulocytes: 1 %
Lymphocytes Relative: 13 %
Lymphs Abs: 1.3 10*3/uL (ref 0.7–4.0)
MCH: 30.7 pg (ref 26.0–34.0)
MCHC: 31.9 g/dL (ref 30.0–36.0)
MCV: 96.3 fL (ref 80.0–100.0)
Monocytes Absolute: 0.6 10*3/uL (ref 0.1–1.0)
Monocytes Relative: 5 %
Neutro Abs: 8.6 10*3/uL — ABNORMAL HIGH (ref 1.7–7.7)
Neutrophils Relative %: 80 %
Platelets: 307 10*3/uL (ref 150–400)
RBC: 4.01 MIL/uL — ABNORMAL LOW (ref 4.22–5.81)
RDW: 16.2 % — ABNORMAL HIGH (ref 11.5–15.5)
WBC: 10.6 10*3/uL — ABNORMAL HIGH (ref 4.0–10.5)
nRBC: 0 % (ref 0.0–0.2)

## 2023-10-31 LAB — COMPREHENSIVE METABOLIC PANEL
ALT: 30 U/L (ref 0–44)
AST: 49 U/L — ABNORMAL HIGH (ref 15–41)
Albumin: 4 g/dL (ref 3.5–5.0)
Alkaline Phosphatase: 94 U/L (ref 38–126)
Anion gap: 26 — ABNORMAL HIGH (ref 5–15)
BUN: 10 mg/dL (ref 6–20)
CO2: 16 mmol/L — ABNORMAL LOW (ref 22–32)
Calcium: 9.1 mg/dL (ref 8.9–10.3)
Chloride: 101 mmol/L (ref 98–111)
Creatinine, Ser: 0.86 mg/dL (ref 0.61–1.24)
GFR, Estimated: >60 mL/min (ref >60)
Glucose, Bld: 138 mg/dL — ABNORMAL HIGH (ref 70–99)
Potassium: 4 mmol/L (ref 3.5–5.1)
Sodium: 143 mmol/L (ref 135–145)
Total Bilirubin: 0.9 mg/dL (ref <1.2)
Total Protein: 8.3 g/dL — ABNORMAL HIGH (ref 6.5–8.1)

## 2023-10-31 LAB — LIPASE, BLOOD: Lipase: 431 U/L — ABNORMAL HIGH (ref 11–51)

## 2023-10-31 LAB — ETHANOL: Alcohol, Ethyl (B): 287 mg/dL — ABNORMAL HIGH (ref <10)

## 2023-10-31 MED ORDER — LORAZEPAM 2 MG/ML IJ SOLN
1.0000 mg | Freq: Once | INTRAMUSCULAR | Status: AC
  Administered 2023-10-31: 1 mg via INTRAVENOUS
  Filled 2023-10-31: qty 1

## 2023-10-31 MED ORDER — SODIUM CHLORIDE 0.9 % IV BOLUS
1000.0000 mL | Freq: Once | INTRAVENOUS | Status: AC
  Administered 2023-10-31: 1000 mL via INTRAVENOUS

## 2023-10-31 MED ORDER — ONDANSETRON HCL 4 MG/2ML IJ SOLN
4.0000 mg | Freq: Once | INTRAMUSCULAR | Status: AC
  Administered 2023-10-31: 4 mg via INTRAVENOUS
  Filled 2023-10-31: qty 2

## 2023-10-31 MED ORDER — OXYCODONE HCL 5 MG PO TABS
10.0000 mg | ORAL_TABLET | Freq: Once | ORAL | Status: AC
  Administered 2023-10-31: 10 mg via ORAL
  Filled 2023-10-31: qty 2

## 2023-10-31 MED ORDER — ONDANSETRON 4 MG PO TBDP
4.0000 mg | ORAL_TABLET | Freq: Once | ORAL | Status: AC
  Administered 2023-10-31: 4 mg via ORAL
  Filled 2023-10-31: qty 1

## 2023-10-31 NOTE — ED Triage Notes (Signed)
PT BIB by [facility] from [place] with a c/o of severe abdominal pain.

## 2023-10-31 NOTE — ED Notes (Signed)
Patient states his last alcohol drink was yesterday. He drank vodka.

## 2023-10-31 NOTE — ED Provider Notes (Signed)
[place] EMERGENCY DEPARTMENT AT [facility] Provider Note   CSN: [id number] Arrival date & time: 10/31/23  1928     History  Chief Complaint  Patient presents with   Abdominal Pain    Gary [name] is a 37 y.o. male.  Patient with history of chronic abdominal pain, frequent ED visits, HIV, schizophrenia --presents to the emergency department for evaluation of abdominal pain.  Patient was recently admitted, left AMA after not receiving IV narcotics.  He subsequently was seen in the emergency department and had reassuring workup, repeat CT on 12/20 showing slightly improved pancreatitis.  Patient continues to drink alcohol.  He reports ongoing vomiting.  Is requesting "help" with pain control.  States that his pain starts at his sternum and goes down to his scrotum.       Home Medications Prior to Admission medications   Medication Sig Start Date End Date Taking? Authorizing Provider  bictegravir-emtricitabine-tenofovir AF (BIKTARVY) 50-200-25 MG TABS tablet Take 1 tablet by mouth daily.    [provider]  levETIRAcetam (KEPPRA) 500 MG tablet Take 1 tablet (500 mg total) by mouth 2 (two) times daily. 10/20/23 11/19/23  Dorcas Carrow, MD  lipase/protease/amylase (CREON) 36000 UNITS CPEP capsule Take 1 capsule (36,000 Units total) by mouth 3 (three) times daily before meals. 10/20/23   Dorcas Carrow, MD  pantoprazole (PROTONIX) 40 MG tablet Take 1 tablet (40 mg total) by mouth daily. 10/20/23   Dorcas Carrow, MD  sucralfate (CARAFATE) 1 g tablet Take 1 tablet (1 g total) by mouth 4 (four) times daily -  with meals and at bedtime. 10/29/23   Horton, Mayer Masker, MD  famotidine (PEPCID) 20 MG tablet Take 1 tablet (20 mg total) by mouth 2 (two) times daily. Patient not taking: Reported on 06/01/2019 05/14/19 06/02/19  Benjiman Core, MD      Allergies    Tegretol [carbamazepine] and Caffeine    Review of Systems   Review of Systems  Physical Exam Updated  Vital Signs BP (!) 114/91 (BP Location: Right Arm)   Pulse (!) 108   Temp 97.8 F (36.6 C) (Oral)   Resp 20   SpO2 99%   Physical Exam Vitals and nursing note reviewed.  Constitutional:      General: He is not in acute distress.    Appearance: He is well-developed.  HENT:     Head: Normocephalic and atraumatic.  Eyes:     General:        Right eye: No discharge.        Left eye: No discharge.     Conjunctiva/sclera: Conjunctivae normal.  Cardiovascular:     Rate and Rhythm: Normal rate and regular rhythm.     Heart sounds: Normal heart sounds.  Pulmonary:     Effort: Pulmonary effort is normal.     Breath sounds: Normal breath sounds.  Abdominal:     Palpations: Abdomen is soft.     Tenderness: There is generalized abdominal tenderness.  Musculoskeletal:     Cervical back: Normal range of motion and neck supple.  Skin:    General: Skin is warm and dry.  Neurological:     Mental Status: He is alert.     ED Results / Procedures / Treatments   Labs (all labs ordered are listed, but only abnormal results are displayed) Labs Reviewed  CBC WITH DIFFERENTIAL/PLATELET - Abnormal; Notable for the following components:      Result Value   WBC 10.6 (*)  RBC 4.01 (*)    Hemoglobin 12.3 (*)    HCT 38.6 (*)    RDW 16.2 (*)    Neutro Abs 8.6 (*)    All other components within normal limits  COMPREHENSIVE METABOLIC PANEL - Abnormal; Notable for the following components:   CO2 16 (*)    Glucose, Bld 138 (*)    Total Protein 8.3 (*)    AST 49 (*)    Anion gap 26 (*)    All other components within normal limits  LIPASE, BLOOD - Abnormal; Notable for the following components:   Lipase 431 (*)    All other components within normal limits  ETHANOL - Abnormal; Notable for the following components:   Alcohol, Ethyl (B) 287 (*)    All other components within normal limits    EKG None  Radiology No results found.  Procedures Procedures    Medications Ordered in  ED Medications  sodium chloride 0.9 % bolus 1,000 mL (0 mLs Intravenous Stopped 10/31/23 2324)  ondansetron (ZOFRAN-ODT) disintegrating tablet 4 mg (4 mg Oral Given 10/31/23 2035)  oxyCODONE (Oxy IR/ROXICODONE) immediate release tablet 10 mg (10 mg Oral Given 10/31/23 2034)  LORazepam (ATIVAN) injection 1 mg (1 mg Intravenous Given 10/31/23 2301)  ondansetron (ZOFRAN) injection 4 mg (4 mg Intravenous Given 10/31/23 2301)    ED Course/ Medical Decision Making/ A&P    Patient seen and examined. History obtained directly from patient.   Labs/EKG: Ordered CBC, CMP, lipase, ethanol level.  Imaging: None ordered  Medications/Fluids: Ordered: IV fluid bolus, ODT Zofran, p.o oxycodone.   Most recent vital signs reviewed and are as follows: BP (!) 114/91 (BP Location: Right Arm)   Pulse (!) 108   Temp 97.8 F (36.6 C) (Oral)   Resp 20   SpO2 99%   Initial impression: Chronic pancreatitis, possibly alcoholic gastritis, in setting of ongoing uncontrolled alcohol use and substance abuse issues.  I have personally reviewed previous hospitalization notes and ED workups.  I agree that treating with oral medications is appropriate for this patient given his history.  11:03 PM Reassessment performed. Patient now vomiting.  More tremulous. He is more tachycardic now, 130s.  CIWA score of 14.  Labs personally reviewed and interpreted including: CBC with white blood cell count 10.6, hemoglobin 12.3; CMP bicarb 16, anion gap 26 likely element of starvation ketosis; lipase elevated again to 431; alcohol abated to 87.  Most current vital signs reviewed and are as follows: BP 120/82   Pulse (!) 136   Temp 97.8 F (36.6 C) (Oral)   Resp (!) 35   Ht 6' (1.829 m)   Wt 68 kg   SpO2 100%   BMI 20.33 kg/m   Plan: IV has been placed, patient receiving IV fluids.  I have ordered 1 mg Ativan.  Will discuss with hospitalist in regards to admission.   11:38 PM Discussed with [facility], Dr. Maisie Fus who  will see.                                 Medical Decision Making Amount and/or Complexity of Data Reviewed Labs: ordered.  Risk Prescription drug management.  For this patient's complaint of abdominal pain, the following conditions were considered on the differential diagnosis: gastritis/PUD, enteritis/duodenitis, appendicitis, cholelithiasis/cholecystitis, cholangitis, pancreatitis, ruptured viscus, colitis, diverticulitis, small/large bowel obstruction, proctitis, cystitis, pyelonephritis, ureteral colic, aortic dissection, aortic aneurysm. Atypical chest etiologies were also considered including ACS, PE,  and pneumonia.  Likely acute on chronic pancreatitis, ongoing alcohol use.  Patient with elevated anion gap likely starvation ketosis in setting of alcohol use.  Elevated CIWA score.  Pulse rate elevated in the 130s.  Patient will need additional stabilization prior to safe discharge.  Hospitalist to see.         Final Clinical Impression(s) / ED Diagnoses Final diagnoses:  Nausea and vomiting, unspecified vomiting type  Chronic pancreatitis due to acute alcohol intoxication [facility])    Rx / DC Orders ED Discharge Orders     None         Renne Crigler, PA-C 10/31/23 2341    Rondel Baton, MD 11/02/23 1421

## 2023-10-31 NOTE — H&P (Incomplete)
History and Physical    Gary [name] WUJ:[id number] DOB: February 13, 1986 DOA: 10/31/2023  PCP: Default, Provider, MD  Patient coming from: Home  I have personally briefly reviewed patient's old medical records in [facility]  Chief Complaint: n/v abdominal pain , etoh w/d with last drink 24 hours prior  HPI: Gary [name] is a 37 y.o. male with medical history significant of  alcohol abuse, recurrent pancreatitis, HIV, seizure disorder, mesenteric thrombus with noncompliance with DOAC, multiple admissions for alcohol abuse and recurrent pancreatitis, drug seeking behavior, with recent hospitalization 12/15-12/19/2024 where he was treated for recurrent acute alcholic pancreatitis. Patient now presents with complaint of refractory n/v associated with abdominal pain similar to previous  hospitalization where he was treated for  acute on chronic alcoho pancreatitis. Patient also note continue etoh abuse and on initial evaluation in ED was noted to have a CIWA of 14.  Per patient his symptoms started   ED Course:  Vitals: afeb , bp 114/91, Hr 108, rr 20  sat 99% on RA In the ED, workup revealed elevated lipase level 431 with exam findings of upper abdominal tenderness. The patient was started on supportive care for recurrent acute alcoholic pancreatitis. CIWA protocol has been initiated.   CT abd 10/29/23 . Slightly improved severe pancreatitis compared to 10/16/2023. 2. 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 3. Hepatic steatosis. 4. Diffuse bladder wall thickening. Correlate with urinalysis to exclude cystitis. Labs  Wbc 10.6, hgb 12.3,  plt307 Na 143,K 4, CL 101, bicarb 16, glu 138, cr 0.86,  AST 49, AG 26 ETOH 287 Tx oxycodone, zofran , ativan NS 1L Review of Systems: As per HPI otherwise 10 point review of systems negative.   Past Medical History:  Diagnosis Date  . Alcohol abuse   . 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  . Anxiety   . Bipolar 2 disorder (HCC)   . HIV  (human immunodeficiency virus infection) (HCC)   . Pancreatitis   . PTSD (post-traumatic stress disorder)   . Schizophrenia (HCC)   . Seizures (HCC)   . Subdural hematoma [facility])     Past Surgical History:  Procedure Laterality Date  . BIOPSY  04/19/2022   Procedure: BIOPSY;  Surgeon: Meridee Score Netty Starring., MD;  Location: [facility] ENDOSCOPY;  Service: Gastroenterology;;  . ENTEROSCOPY N/A 04/19/2022   Procedure: ENTEROSCOPY;  Surgeon: Meridee Score Netty Starring., MD;  Location: [facility] ENDOSCOPY;  Service: Gastroenterology;  Laterality: N/A;  . INCISION AND DRAINAGE PERIRECTAL ABSCESS N/A 09/24/2016   Procedure: IRRIGATION AND DEBRIDEMENT PERIRECTAL ABSCESS;  Surgeon: Ricarda Frame, MD;  Location: [facility] ORS;  Service: General;  Laterality: N/A;  . none       reports that he has been smoking cigarettes. He started smoking about 20 years ago. He has a 10.5 pack-year smoking history. He has never used smokeless tobacco. He reports current alcohol use of about 105.0 standard drinks of alcohol per week. He reports current drug use. Drugs: Methamphetamines and Cocaine.  Allergies  Allergen Reactions  . Tegretol [Carbamazepine] Other (See Comments)    Caused vertigo for 2 days after taking it  . Caffeine Palpitations    Family History  Problem Relation Age of Onset  . Alcohol abuse Mother   . Alcohol abuse Father   . Colon cancer Other   . Other Other   . Cancer Other    *** Prior to Admission medications   Medication Sig Start Date End Date Taking? Authorizing Provider  bictegravir-emtricitabine-tenofovir AF (BIKTARVY) 50-200-25 MG TABS tablet Take 1  tablet by mouth daily.    [provider]  levETIRAcetam (KEPPRA) 500 MG tablet Take 1 tablet (500 mg total) by mouth 2 (two) times daily. 10/20/23 11/19/23  Dorcas Carrow, MD  lipase/protease/amylase (CREON) 36000 UNITS CPEP capsule Take 1 capsule (36,000 Units total) by mouth 3 (three) times daily before meals. 10/20/23   Dorcas Carrow, MD   pantoprazole (PROTONIX) 40 MG tablet Take 1 tablet (40 mg total) by mouth daily. 10/20/23   Dorcas Carrow, MD  sucralfate (CARAFATE) 1 g tablet Take 1 tablet (1 g total) by mouth 4 (four) times daily -  with meals and at bedtime. 10/29/23   Horton, Mayer Masker, MD  famotidine (PEPCID) 20 MG tablet Take 1 tablet (20 mg total) by mouth 2 (two) times daily. Patient not taking: Reported on 06/01/2019 05/14/19 06/02/19  Benjiman Core, MD    Physical Exam: Vitals:   10/31/23 2020 10/31/23 2200 10/31/23 2230 10/31/23 2256  BP:  106/77 111/86 120/82  Pulse:  (!) 130 (!) 132 (!) 136  Resp:  (!) 30 (!) 35   Temp:      TempSrc:      SpO2:  99% 100%   Weight: 68 kg     Height: 6' (1.829 m)       Constitutional: NAD, calm, comfortable Vitals:   10/31/23 2020 10/31/23 2200 10/31/23 2230 10/31/23 2256  BP:  106/77 111/86 120/82  Pulse:  (!) 130 (!) 132 (!) 136  Resp:  (!) 30 (!) 35   Temp:      TempSrc:      SpO2:  99% 100%   Weight: 68 kg     Height: 6' (1.829 m)      Eyes: PERRL, lids and conjunctivae normal ENMT: Mucous membranes are moist. Posterior pharynx clear of any exudate or lesions.Normal dentition.  Neck: normal, supple, no masses, no thyromegaly Respiratory: clear to auscultation bilaterally, no wheezing, no crackles. Normal respiratory effort. No accessory muscle use.  Cardiovascular: Regular rate and rhythm, no murmurs / rubs / gallops. No extremity edema. 2+ pedal pulses. No carotid bruits.  Abdomen: no tenderness, no masses palpated. No hepatosplenomegaly. Bowel sounds positive.  Musculoskeletal: no clubbing / cyanosis. No joint deformity upper and lower extremities. Good ROM, no contractures. Normal muscle tone.  Skin: no rashes, lesions, ulcers. No induration Neurologic: CN 2-12 grossly intact. Sensation intact, DTR normal. Strength 5/5 in all 4.  Psychiatric: Normal judgment and insight. Alert and oriented x 3. Normal mood.    Labs on Admission: I have personally  reviewed following labs and imaging studies  CBC: Recent Labs  Lab 10/25/23 0430 10/26/23 0938 10/27/23 0637 10/28/23 1846 10/31/23 1942  WBC 4.8 2.9* 2.9* 7.2 10.6*  NEUTROABS  --  1.5* 1.2* 5.2 8.6*  HGB 11.3* 10.7* 11.5* 13.3 12.3*  HCT 33.8* 32.2* 34.7* 39.0 38.6*  MCV 93.4 93.6 94.6 94.0 96.3  PLT 474* 338 287 381 307   Basic Metabolic Panel: Recent Labs  Lab 10/25/23 0430 10/26/23 0938 10/27/23 0637 10/28/23 1846 10/31/23 1942  NA 142 134* 135 137 143  K 3.4* 3.4* 3.6 3.2* 4.0  CL 108 101 107 104 101  CO2 30 27 21* 24 16*  GLUCOSE 67* 167* 105* 124* 138*  BUN <5* <5* <5* <5* 10  CREATININE 0.50* 0.58* 0.55* 0.67 0.86  CALCIUM 7.7* 8.6* 8.3* 9.2 9.1  MG 1.3*  --  1.7 2.2  --   PHOS 3.1  --  4.3  --   --  GFR: Estimated Creatinine Clearance: 113.1 mL/min (by C-G formula based on SCr of 0.86 mg/dL). Liver Function Tests: Recent Labs  Lab 10/25/23 0430 10/26/23 0938 10/27/23 0637 10/28/23 1846 10/31/23 1942  AST 25 30 24 23  49*  ALT 23 23 18 20 30   ALKPHOS 45 58 57 67 94  BILITOT 0.5 0.9 0.7 0.4 0.9  PROT 5.5* 5.4* 5.6* 7.4 8.3*  ALBUMIN 2.4* 2.6* 2.6* 3.6 4.0   Recent Labs  Lab 10/26/23 0938 10/28/23 1846 10/31/23 1942  LIPASE 90* 97* 431*   No results for input(s): "AMMONIA" in the last 168 hours. Coagulation Profile: No results for input(s): "INR", "PROTIME" in the last 168 hours. Cardiac Enzymes: No results for input(s): "CKTOTAL", "CKMB", "CKMBINDEX", "TROPONINI" in the last 168 hours. BNP (last 3 results) No results for input(s): "PROBNP" in the last 8760 hours. HbA1C: No results for input(s): "HGBA1C" in the last 72 hours. CBG: Recent Labs  Lab 10/26/23 1605 10/26/23 2038 10/27/23 0808 10/27/23 1155 10/28/23 0648  GLUCAP 113* 139* 113* 145* 90   Lipid Profile: No results for input(s): "CHOL", "HDL", "LDLCALC", "TRIG", "CHOLHDL", "LDLDIRECT" in the last 72 hours. Thyroid Function Tests: No results for input(s): "TSH",  "T4TOTAL", "FREET4", "T3FREE", "THYROIDAB" in the last 72 hours. Anemia Panel: No results for input(s): "VITAMINB12", "FOLATE", "FERRITIN", "TIBC", "IRON", "RETICCTPCT" in the last 72 hours. Urine analysis:    Component Value Date/Time   COLORURINE YELLOW 10/28/2023 2358   APPEARANCEUR CLEAR 10/28/2023 2358   APPEARANCEUR Clear 11/15/2014 1755   LABSPEC RESULTS UNAVAILABLE DUE TO INTERFERING SUBSTANCE 10/28/2023 2358   LABSPEC 1.002 11/15/2014 1755   PHURINE 5.0 10/28/2023 2358   GLUCOSEU NEGATIVE 10/28/2023 2358   GLUCOSEU Negative 11/15/2014 1755   HGBUR NEGATIVE 10/28/2023 2358   BILIRUBINUR NEGATIVE 10/28/2023 2358   BILIRUBINUR Negative 11/15/2014 1755   KETONESUR NEGATIVE 10/28/2023 2358   PROTEINUR NEGATIVE 10/28/2023 2358   NITRITE NEGATIVE 10/28/2023 2358   LEUKOCYTESUR NEGATIVE 10/28/2023 2358   LEUKOCYTESUR Negative 11/15/2014 1755    Radiological Exams on Admission: No results found.  EKG: Independently reviewed. ***  Assessment/Plan Active Problems:   * No active hospital problems. *   ***  DVT prophylaxis: *** (Lovenox/Heparin/SCD's/anticoagulated/None (if comfort care) Code Status: *** (Full/Partial (specify details) Family Communication: *** (Specify name, relationship. Do not write "discussed with patient". Specify tel # if discussed over the phone) Disposition Plan: *** (specify when and where you expect patient to be discharged) Consults called: *** (with names) Admission status: *** (inpatient / obs / tele / medical floor / SDU)   Lurline Del MD [facility] Pager [phone]  If 7PM-7AM, please contact night-coverage [email or web address] Password [facility]  10/31/2023, 11:42 PM

## 2023-10-31 NOTE — ED Notes (Signed)
Consulted with another RN to get a IV stick

## 2023-10-31 NOTE — H&P (Addendum)
History and Physical    Gary [name] YTK:[id number] DOB: November 17, 1985 DOA: 10/31/2023  PCP: Default, Provider, MD  Patient coming from: Home  I have personally briefly reviewed patient's old medical records in [facility]  Chief Complaint: n/v abdominal pain , etoh w/d with last drink 24 hours prior  HPI: Gary [name] is a 37 y.o. male with medical history significant of  alcohol abuse, recurrent pancreatitis, HIV, seizure disorder noncompliance with his keppra, mesenteric thrombus off OAC, multiple admissions for alcohol abuse and recurrent pancreatitis, drug seeking behavior, with recent hospitalization 12/15-12/19/2024 where he was treated for recurrent acute alcoholic pancreatitis but signed out AMA. Patient now presents with complaint of refractory n/v associated with abdominal pain similar to previous  hospitalization. Patient also notes continue etoh abuse. On initial evaluation in ED  patient was noted to have a CIWA of 14.  Patient states last drink was  the am prior to presentation. He notes he drinks daily. He however states he would like to quit and is interested in inpatient etoh rehab. Patient noted he still has  abdominal pain despite pain medications oral/ iv given in ED. He also noted continued nausea.  He also notes feeling anxious and tremulous. He however notes no chest pain/ sob or diarrhea, but does endorse chronic back pain and chronic knee pain.  ED Course:  Vitals: afeb , bp 114/91, Hr 108, rr 20  sat 99% on RA In the ED, workup revealed elevated lipase level 431 with exam findings of upper abdominal tenderness. The patient was started on supportive care for recurrent acute alcoholic pancreatitis. CIWA protocol has been initiated.   CT abd 10/29/23 . Slightly improved severe pancreatitis compared to 10/16/2023. 2. 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 3. Hepatic steatosis. 4. Diffuse bladder wall thickening. Correlate with urinalysis to exclude  cystitis. Labs  Wbc 10.6, hgb 12.3,  plt307 Na 143,K 4, CL 101, bicarb 16, glu 138, cr 0.86,  AST 49, AG 26 ETOH 287 Tx oxycodone, zofran , ativan NS 1L Review of Systems: As per HPI otherwise 10 point review of systems negative.   Past Medical History:  Diagnosis Date   Alcohol abuse    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   Anxiety    Bipolar 2 disorder (HCC)    HIV (human immunodeficiency virus infection) (HCC)    Pancreatitis    PTSD (post-traumatic stress disorder)    Schizophrenia (HCC)    Seizures (HCC)    Subdural hematoma (HCC)     Past Surgical History:  Procedure Laterality Date   BIOPSY  04/19/2022   Procedure: BIOPSY;  Surgeon: Lemar Lofty., MD;  Location: [facility] ENDOSCOPY;  Service: Gastroenterology;;   ENTEROSCOPY N/A 04/19/2022   Procedure: ENTEROSCOPY;  Surgeon: Lemar Lofty., MD;  Location[facility]COPY;  Service: Gastroenterology;  Laterality: N/A;   INCISION AND DRAINAGE PERIRECTAL ABSCESS N/A 09/24/2016   Procedure: IRRIGATION AND DEBRIDEMENT PERIRECTAL ABSCESS;  Surgeon: Ricarda Frame, MD;  Location: [facility] ORS;  Service: General;  Laterality: N/A;   none       reports that he has been smoking cigarettes. He started smoking about 20 years ago. He has a 10.5 pack-year smoking history. He has never used smokeless tobacco. He reports current alcohol use of about 105.0 standard drinks of alcohol per week. He reports current drug use. Drugs: Methamphetamines and Cocaine.  Allergies  Allergen Reactions   Tegretol [Carbamazepine] Other (See Comments)    Caused vertigo for 2 days after taking it  Caffeine Palpitations    Family History  Problem Relation Age of Onset   Alcohol abuse Mother    Alcohol abuse Father    Colon cancer Other    Other Other    Cancer Other     Prior to Admission medications   Medication Sig Start Date End Date Taking? Authorizing Provider  bictegravir-emtricitabine-tenofovir AF (BIKTARVY) 50-200-25 MG TABS  tablet Take 1 tablet by mouth daily.    [provider]  levETIRAcetam (KEPPRA) 500 MG tablet Take 1 tablet (500 mg total) by mouth 2 (two) times daily. 10/20/23 11/19/23  Dorcas Carrow, MD  lipase/protease/amylase (CREON) 36000 UNITS CPEP capsule Take 1 capsule (36,000 Units total) by mouth 3 (three) times daily before meals. 10/20/23   Dorcas Carrow, MD  pantoprazole (PROTONIX) 40 MG tablet Take 1 tablet (40 mg total) by mouth daily. 10/20/23   Dorcas Carrow, MD  sucralfate (CARAFATE) 1 g tablet Take 1 tablet (1 g total) by mouth 4 (four) times daily -  with meals and at bedtime. 10/29/23   Horton, Mayer Masker, MD  famotidine (PEPCID) 20 MG tablet Take 1 tablet (20 mg total) by mouth 2 (two) times daily. Patient not taking: Reported on 06/01/2019 05/14/19 06/02/19  Benjiman Core, MD    Physical Exam: Vitals:   10/31/23 2020 10/31/23 2200 10/31/23 2230 10/31/23 2256  BP:  106/77 111/86 120/82  Pulse:  (!) 130 (!) 132 (!) 136  Resp:  (!) 30 (!) 35   Temp:      TempSrc:      SpO2:  99% 100%   Weight: 68 kg     Height: 6' (1.829 m)       Constitutional: NAD, calm, comfortable cachetic Vitals:   10/31/23 2020 10/31/23 2200 10/31/23 2230 10/31/23 2256  BP:  106/77 111/86 120/82  Pulse:  (!) 130 (!) 132 (!) 136  Resp:  (!) 30 (!) 35   Temp:      TempSrc:      SpO2:  99% 100%   Weight: 68 kg     Height: 6' (1.829 m)      Eyes: PERRL, lids and conjunctivae normal ENMT: Mucous membranes are moist. Posterior pharynx clear of any exudate or lesions.Normal dentition.  Neck: normal, supple, no masses, no thyromegaly Respiratory: clear to auscultation bilaterally, no wheezing, no crackles. Normal respiratory effort. No accessory muscle use.  Cardiovascular: Regular rate and rhythm, no murmurs / rubs / gallops. No extremity edema. 2+ pedal pulses. No carotid bruits.  Abdomen:scaphoid, +tenderness diffuse but greater in mid abdomen and epigastrium, no masses palpated. No  hepatosplenomegaly. Bowel sounds positive.  Musculoskeletal: no clubbing / cyanosis. No joint deformity upper and lower extremities. Good ROM, no contractures Skin: no rashes, lesions, ulcers. No induration Neurologic: CN 2-12 grossly intact. Sensation intact, MAE x 4, + tremulousness  Psychiatric: Normal judgment and insight. Alert and oriented x 3. Normal mood.    Labs on Admission: I have personally reviewed following labs and imaging studies  CBC: Recent Labs  Lab 10/25/23 0430 10/26/23 0938 10/27/23 0637 10/28/23 1846 10/31/23 1942  WBC 4.8 2.9* 2.9* 7.2 10.6*  NEUTROABS  --  1.5* 1.2* 5.2 8.6*  HGB 11.3* 10.7* 11.5* 13.3 12.3*  HCT 33.8* 32.2* 34.7* 39.0 38.6*  MCV 93.4 93.6 94.6 94.0 96.3  PLT 474* 338 287 381 307   Basic Metabolic Panel: Recent Labs  Lab 10/25/23 0430 10/26/23 0938 10/27/23 0637 10/28/23 1846 10/31/23 1942  NA 142 134* 135 137 143  K  3.4* 3.4* 3.6 3.2* 4.0  CL 108 101 107 104 101  CO2 30 27 21* 24 16*  GLUCOSE 67* 167* 105* 124* 138*  BUN <5* <5* <5* <5* 10  CREATININE 0.50* 0.58* 0.55* 0.67 0.86  CALCIUM 7.7* 8.6* 8.3* 9.2 9.1  MG 1.3*  --  1.7 2.2  --   PHOS 3.1  --  4.3  --   --    GFR: Estimated Creatinine Clearance: 113.1 mL/min (by C-G formula based on SCr of 0.86 mg/dL). Liver Function Tests: Recent Labs  Lab 10/25/23 0430 10/26/23 0938 10/27/23 0637 10/28/23 1846 10/31/23 1942  AST 25 30 24 23  49*  ALT 23 23 18 20 30   ALKPHOS 45 58 57 67 94  BILITOT 0.5 0.9 0.7 0.4 0.9  PROT 5.5* 5.4* 5.6* 7.4 8.3*  ALBUMIN 2.4* 2.6* 2.6* 3.6 4.0   Recent Labs  Lab 10/26/23 0938 10/28/23 1846 10/31/23 1942  LIPASE 90* 97* 431*   No results for input(s): "AMMONIA" in the last 168 hours. Coagulation Profile: No results for input(s): "INR", "PROTIME" in the last 168 hours. Cardiac Enzymes: No results for input(s): "CKTOTAL", "CKMB", "CKMBINDEX", "TROPONINI" in the last 168 hours. BNP (last 3 results) No results for input(s):  "PROBNP" in the last 8760 hours. HbA1C: No results for input(s): "HGBA1C" in the last 72 hours. CBG: Recent Labs  Lab 10/26/23 1605 10/26/23 2038 10/27/23 0808 10/27/23 1155 10/28/23 0648  GLUCAP 113* 139* 113* 145* 90   Lipid Profile: No results for input(s): "CHOL", "HDL", "LDLCALC", "TRIG", "CHOLHDL", "LDLDIRECT" in the last 72 hours. Thyroid Function Tests: No results for input(s): "TSH", "T4TOTAL", "FREET4", "T3FREE", "THYROIDAB" in the last 72 hours. Anemia Panel: No results for input(s): "VITAMINB12", "FOLATE", "FERRITIN", "TIBC", "IRON", "RETICCTPCT" in the last 72 hours. Urine analysis:    Component Value Date/Time   COLORURINE YELLOW 10/28/2023 2358   APPEARANCEUR CLEAR 10/28/2023 2358   APPEARANCEUR Clear 11/15/2014 1755   LABSPEC RESULTS UNAVAILABLE DUE TO INTERFERING SUBSTANCE 10/28/2023 2358   LABSPEC 1.002 11/15/2014 1755   PHURINE 5.0 10/28/2023 2358   GLUCOSEU NEGATIVE 10/28/2023 2358   GLUCOSEU Negative 11/15/2014 1755   HGBUR NEGATIVE 10/28/2023 2358   BILIRUBINUR NEGATIVE 10/28/2023 2358   BILIRUBINUR Negative 11/15/2014 1755   KETONESUR NEGATIVE 10/28/2023 2358   PROTEINUR NEGATIVE 10/28/2023 2358   NITRITE NEGATIVE 10/28/2023 2358   LEUKOCYTESUR NEGATIVE 10/28/2023 2358   LEUKOCYTESUR Negative 11/15/2014 1755    Radiological Exams on Admission: No results found.  EKG: Independently reviewed.   Assessment/Plan  Acute on chronic recurrent Alcoholic pancreatitis  -admit to progressive care  -supportive IVFS  - KUB to be complete ensure no obstruction,  -CT imaging completed 2 days ago no acute need to repeat at this time unless noted clinical change.    ETOH abuse with intoxication  Anion Gap acidosis -etoh high 200's - place on ciwa protocol  - noted low bicarb probable etoh ketosis /lactic acidosis, to be complete check vbg, lactate -continue on ivfs -monitor labs  Seizure d/o  -probable related to ETOH abuse  - on keppra 500 mg po  bid will resume  -place on seizure precautions  - check mag ,electrolytes currently noted to be stable   HIV -Continue on anti-viral medications  -last CD4 count 566, and HIV-1 RNA quant 4520 per chart improved from prior -patient notes his compliance with his HIV medications  Hypomagnesemia -replete prn   DVT prophylaxis: heparin Code Status: full/ as discussed per patient wishes in event of cardiac  arrest  Family Communication: none at bedside Disposition Plan: patient  expected to be admitted greater than 2 midnights  Consults called: n/a Admission status: progressive care   Lurline Del MD [facility]   If 7PM-7AM, please contact night-coverage [email or web address] Password [facility]  10/31/2023, 11:42 PM

## 2023-11-01 ENCOUNTER — Inpatient Hospital Stay (HOSPITAL_COMMUNITY): Payer: MEDICAID

## 2023-11-01 ENCOUNTER — Encounter (HOSPITAL_COMMUNITY): Payer: Self-pay | Admitting: Internal Medicine

## 2023-11-01 DIAGNOSIS — R112 Nausea with vomiting, unspecified: Secondary | ICD-10-CM

## 2023-11-01 LAB — COMPREHENSIVE METABOLIC PANEL
ALT: 23 U/L (ref 0–44)
AST: 33 U/L (ref 15–41)
Albumin: 3.2 g/dL — ABNORMAL LOW (ref 3.5–5.0)
Alkaline Phosphatase: 73 U/L (ref 38–126)
Anion gap: 16 — ABNORMAL HIGH (ref 5–15)
BUN: 10 mg/dL (ref 6–20)
CO2: 21 mmol/L — ABNORMAL LOW (ref 22–32)
Calcium: 7.8 mg/dL — ABNORMAL LOW (ref 8.9–10.3)
Chloride: 104 mmol/L (ref 98–111)
Creatinine, Ser: 1.01 mg/dL (ref 0.61–1.24)
GFR, Estimated: >60 mL/min (ref >60)
Glucose, Bld: 126 mg/dL — ABNORMAL HIGH (ref 70–99)
Potassium: 4.1 mmol/L (ref 3.5–5.1)
Sodium: 141 mmol/L (ref 135–145)
Total Bilirubin: 0.7 mg/dL (ref <1.2)
Total Protein: 6.6 g/dL (ref 6.5–8.1)

## 2023-11-01 LAB — CBC
HCT: 42 % (ref 39.0–52.0)
Hemoglobin: 13.7 g/dL (ref 13.0–17.0)
MCH: 31.1 pg (ref 26.0–34.0)
MCHC: 32.6 g/dL (ref 30.0–36.0)
MCV: 95.5 fL (ref 80.0–100.0)
Platelets: 372 10*3/uL (ref 150–400)
RBC: 4.4 MIL/uL (ref 4.22–5.81)
RDW: 16.1 % — ABNORMAL HIGH (ref 11.5–15.5)
WBC: 11.9 10*3/uL — ABNORMAL HIGH (ref 4.0–10.5)
nRBC: 0 % (ref 0.0–0.2)

## 2023-11-01 LAB — URINALYSIS, W/ REFLEX TO CULTURE (INFECTION SUSPECTED)
Bilirubin Urine: NEGATIVE
Glucose, UA: 50 mg/dL — AB
Hgb urine dipstick: NEGATIVE
Ketones, ur: 80 mg/dL — AB
Leukocytes,Ua: NEGATIVE
Nitrite: NEGATIVE
Protein, ur: 100 mg/dL — AB
Specific Gravity, Urine: 1.026 (ref 1.005–1.030)
pH: 5 (ref 5.0–8.0)

## 2023-11-01 LAB — LACTIC ACID, PLASMA
Lactic Acid, Venous: 2.1 mmol/L (ref 0.5–1.9)
Lactic Acid, Venous: 1.8 mmol/L (ref 0.5–1.9)

## 2023-11-01 LAB — BLOOD GAS, VENOUS
Acid-Base Excess: 1.7 mmol/L (ref 0.0–2.0)
Bicarbonate: 27.7 mmol/L (ref 20.0–28.0)
O2 Saturation: 78.6 %
Patient temperature: 37.2
pCO2, Ven: 48 mm[Hg] (ref 44–60)
pH, Ven: 7.37 (ref 7.25–7.43)
pO2, Ven: 52 mm[Hg] — ABNORMAL HIGH (ref 32–45)

## 2023-11-01 LAB — GLUCOSE, CAPILLARY
Glucose-Capillary: 120 mg/dL — ABNORMAL HIGH (ref 70–99)
Glucose-Capillary: 131 mg/dL — ABNORMAL HIGH (ref 70–99)
Glucose-Capillary: 100 mg/dL — ABNORMAL HIGH (ref 70–99)

## 2023-11-01 LAB — MAGNESIUM: Magnesium: 1.6 mg/dL — ABNORMAL LOW (ref 1.7–2.4)

## 2023-11-01 LAB — CBG MONITORING, ED: Glucose-Capillary: 90 mg/dL (ref 70–99)

## 2023-11-01 MED ORDER — SODIUM CHLORIDE 0.45 % IV BOLUS
500.0000 mL | Freq: Once | INTRAVENOUS | Status: AC
  Administered 2023-11-01: 500 mL via INTRAVENOUS

## 2023-11-01 MED ORDER — ALBUTEROL SULFATE (2.5 MG/3ML) 0.083% IN NEBU: 2.5000 mg | INHALATION_SOLUTION | RESPIRATORY_TRACT | Status: DC | PRN

## 2023-11-01 MED ORDER — FOLIC ACID 1 MG PO TABS
1.0000 mg | ORAL_TABLET | Freq: Every day | ORAL | Status: DC
  Administered 2023-11-01 – 2023-11-06 (×6): 1 mg via ORAL
  Filled 2023-11-01 (×6): qty 1

## 2023-11-01 MED ORDER — LORAZEPAM 1 MG PO TABS: 1.0000 mg | ORAL_TABLET | ORAL | Status: AC | PRN

## 2023-11-01 MED ORDER — PANTOPRAZOLE SODIUM 40 MG PO TBEC
40.0000 mg | DELAYED_RELEASE_TABLET | Freq: Every day | ORAL | Status: DC
  Administered 2023-11-01 – 2023-11-06 (×6): 40 mg via ORAL
  Filled 2023-11-01 (×6): qty 1

## 2023-11-01 MED ORDER — HEPARIN SODIUM (PORCINE) 5000 UNIT/ML IJ SOLN
5000.0000 [IU] | Freq: Three times a day (TID) | INTRAMUSCULAR | Status: DC
  Administered 2023-11-01 – 2023-11-05 (×13): 5000 [IU] via SUBCUTANEOUS
  Filled 2023-11-01 (×15): qty 1

## 2023-11-01 MED ORDER — OXYCODONE HCL 5 MG PO TABS
5.0000 mg | ORAL_TABLET | ORAL | Status: DC | PRN
  Administered 2023-11-02 – 2023-11-06 (×8): 5 mg via ORAL
  Filled 2023-11-01 (×9): qty 1

## 2023-11-01 MED ORDER — ACETAMINOPHEN 650 MG RE SUPP: 650.0000 mg | Freq: Four times a day (QID) | RECTAL | Status: DC | PRN

## 2023-11-01 MED ORDER — SODIUM CHLORIDE 0.9 % IV SOLN: INTRAVENOUS | Status: AC

## 2023-11-01 MED ORDER — MAGNESIUM SULFATE 4 GM/100ML IV SOLN
4.0000 g | Freq: Once | INTRAVENOUS | Status: AC
  Administered 2023-11-01: 4 g via INTRAVENOUS
  Filled 2023-11-01: qty 100

## 2023-11-01 MED ORDER — SUCRALFATE 1 G PO TABS
1.0000 g | ORAL_TABLET | Freq: Three times a day (TID) | ORAL | Status: DC
Start: 2023-11-01 — End: 2023-11-01

## 2023-11-01 MED ORDER — LORAZEPAM 2 MG/ML IJ SOLN
1.0000 mg | INTRAMUSCULAR | Status: AC | PRN
  Administered 2023-11-01 – 2023-11-02 (×5): 1 mg via INTRAVENOUS
  Administered 2023-11-02: 2 mg via INTRAVENOUS
  Administered 2023-11-02: 1 mg via INTRAVENOUS
  Administered 2023-11-02: 2 mg via INTRAVENOUS
  Administered 2023-11-02 – 2023-11-03 (×3): 1 mg via INTRAVENOUS
  Administered 2023-11-03 (×2): 2 mg via INTRAVENOUS
  Administered 2023-11-03: 1 mg via INTRAVENOUS
  Filled 2023-11-01 (×15): qty 1

## 2023-11-01 MED ORDER — ONDANSETRON HCL 4 MG/2ML IJ SOLN
4.0000 mg | Freq: Four times a day (QID) | INTRAMUSCULAR | Status: DC | PRN
  Administered 2023-11-01 – 2023-11-06 (×4): 4 mg via INTRAVENOUS
  Filled 2023-11-01 (×3): qty 2

## 2023-11-01 MED ORDER — ACETAMINOPHEN 325 MG PO TABS: 650.0000 mg | ORAL_TABLET | Freq: Four times a day (QID) | ORAL | Status: DC | PRN

## 2023-11-01 MED ORDER — ONDANSETRON HCL 4 MG/2ML IJ SOLN
4.0000 mg | Freq: Once | INTRAMUSCULAR | Status: DC
  Filled 2023-11-01: qty 2

## 2023-11-01 MED ORDER — THIAMINE MONONITRATE 100 MG PO TABS
100.0000 mg | ORAL_TABLET | Freq: Every day | ORAL | Status: DC
  Administered 2023-11-01 – 2023-11-06 (×6): 100 mg via ORAL
  Filled 2023-11-01 (×6): qty 1

## 2023-11-01 MED ORDER — PROMETHAZINE HCL 12.5 MG PO TABS
12.5000 mg | ORAL_TABLET | Freq: Four times a day (QID) | ORAL | Status: DC | PRN
  Administered 2023-11-01 – 2023-11-05 (×5): 12.5 mg via ORAL
  Filled 2023-11-01 (×4): qty 1

## 2023-11-01 MED ORDER — ADULT MULTIVITAMIN W/MINERALS CH
1.0000 | ORAL_TABLET | Freq: Every day | ORAL | Status: DC
  Administered 2023-11-01 – 2023-11-06 (×6): 1 via ORAL
  Filled 2023-11-01 (×5): qty 1

## 2023-11-01 MED ORDER — BICTEGRAVIR-EMTRICITAB-TENOFOV 50-200-25 MG PO TABS
1.0000 | ORAL_TABLET | Freq: Every day | ORAL | Status: DC
  Administered 2023-11-01 – 2023-11-06 (×6): 1 via ORAL
  Filled 2023-11-01 (×6): qty 1

## 2023-11-01 MED ORDER — LEVETIRACETAM IN NACL 500 MG/100ML IV SOLN
500.0000 mg | Freq: Two times a day (BID) | INTRAVENOUS | Status: DC
  Administered 2023-11-01 – 2023-11-02 (×3): 500 mg via INTRAVENOUS
  Filled 2023-11-01 (×3): qty 100

## 2023-11-01 MED ORDER — HYDROMORPHONE HCL 1 MG/ML IJ SOLN
0.5000 mg | INTRAMUSCULAR | Status: DC | PRN
  Administered 2023-11-01 – 2023-11-03 (×13): 1 mg via INTRAVENOUS
  Administered 2023-11-03: 0.5 mg via INTRAVENOUS
  Administered 2023-11-04: 1 mg via INTRAVENOUS
  Administered 2023-11-04: 0.5 mg via INTRAVENOUS
  Administered 2023-11-04 (×2): 1 mg via INTRAVENOUS
  Administered 2023-11-04: 0.5 mg via INTRAVENOUS
  Administered 2023-11-05 – 2023-11-06 (×6): 1 mg via INTRAVENOUS
  Administered 2023-11-06: 0.5 mg via INTRAVENOUS
  Filled 2023-11-01 (×3): qty 1
  Filled 2023-11-01: qty 0.5
  Filled 2023-11-01 (×11): qty 1
  Filled 2023-11-01: qty 0.5
  Filled 2023-11-01 (×9): qty 1
  Filled 2023-11-01: qty 0.5

## 2023-11-01 MED ORDER — THIAMINE HCL 100 MG/ML IJ SOLN
100.0000 mg | Freq: Every day | INTRAMUSCULAR | Status: DC
  Filled 2023-11-01: qty 2

## 2023-11-01 NOTE — Plan of Care (Signed)
  Problem: Education: Goal: Knowledge of General Education information will improve Description: Including pain rating scale, medication(s)/side effects and non-pharmacologic comfort measures Outcome: Progressing   Problem: Health Behavior/Discharge Planning: Goal: Ability to manage health-related needs will improve Outcome: Progressing   Problem: Pain Management: Goal: General experience of comfort will improve Outcome: Progressing   Problem: Safety: Goal: Ability to remain free from injury will improve Outcome: Progressing

## 2023-11-01 NOTE — Discharge Instructions (Signed)
                   Outpatient Substance Abuse  Treatment- uninsured  Narcotics Anonymous 24-HOUR HELPLINE Pre-recorded for Meeting Schedules [facility] [phone]  [email or web address] ALCOHOLICS ANONYMOUS  [place]  Answering Service [phone] Please Note: All [place] Meetings are Non-smoking [email or web address]  Alcohol and Drug Services -  Insurance: Medicaid /State funding/private insurance Methadone, suboxone/Intensive outpatient  [place] [phone] Fax: [phone] [address], [place], [place], [address] [place] [phone] Fax: [phone]    [address], [place], [place], [address] (Serves [place], [place], [place], [place], [place], [place], [place], [place]) [facility] [email or web address] Accepts State funding/Medicaid Transitional housing, Intensive Outpatient Treatment, Outpatient treatment, Veterans Services  Phone: [phone] Fax: [phone] Address: [address], [place] [address]   [facility] (http://[email or web address].[email or web address]/) Insurance: Medicaid Case Management, Community Support Team, Medication Management, Outpatient Therapy, Psychosocial Rehabilitation, Substance Abuse Intensive Outpatient  Phone: [phone] Fax: [phone] [address], [place], [place], [address]  [facility] Medicaid, most private insurance providers Types of Program: Individual/Group Therapy, Substance Abuse Treatment  Phone: [place] [phone] Fax: [phone] [address], [place], [place], [address] [place] [phone] [address], [place], [place], [address]  [facility]  Medicaid Types of Program: SAIOP  Phone: [phone] Fax: [phone] [address], [place], [place], [address] [facility] Medicaid/state funds Crisis line [phone] [place]-[address] [phone] [place] -[phone]  [facility] [address];  [place], [place] [address] [phone]  Substance Abuse Intensive Outpatient Program [facility] [address] [place], [place] [address] [phone]- Substance abuse treatment  Successful Transitions  Insurance: [place] Medicaid, BCBS, sliding scale Types of Program: substance abuse treatment, transportation assistance Phone: [phone] Fax: [phone] Address: [address], [place] [address] [facility] ([email or web address]/) Insurance: UHC, BCBS, Aetna, Medicaid of [place] Program: addiction counseling, detoxification,  Phone: [phone]  Fax: [phone] Address: [address], [place] [address]  [facility] (statewide facilities/programs) [address] (Medicaid/state funds) [place], [place] [address]                      [email or web address] [phone] [place]- [phone] [place]- [phone] [facility] (2 Locations) (Medicaid/state funds) --[address]  walk in 8:30-12 and 1-2:30 [place], [place]   PH- [phone] --[address] [place], [place] [address]  [phone] walk in 8:30-12 and 2-3:30  [facility] state funds/medicaid [address]  [place], [place] [address] [phone] [facility] (Suboxone clinic) Medicaid/state funds  [address]  [place], [place] [address] [phone]   [facility]  [place]-[address], [place], [place] [address]  [phone] (24 hours) [place]- [address] [place], [place] [address]  [phone] (24 hours) [place]- [address] [place] [phone] [place]- [address] [place] [phone] [place]- [address] [place] [address] [place] [phone] [facility]- Medicaid and state funds  [place]- [address] [place], [place] [address] [phone] (24 hours) [place]- [address] [place] [address] [place], [place]  [address] [phone] [place]- [address] [place], [place] [address] [phone] (24 hours) [place] [address] [place], [place]  [address] [phone] [place]- [address] [place] [phone]    

## 2023-11-01 NOTE — Progress Notes (Signed)
 CSW received consult for substance abuse resources - resources were added to AVS.  Edwin Dada, MSW, LCSW Transitions of Care  Clinical Social Worker II [phone]

## 2023-11-01 NOTE — ED Notes (Signed)
ED TO INPATIENT HANDOFF REPORT  ED Nurse Name and Phone #:  Rodney Booze [phone]  S Name/Age/Gender Gary [name] 37 y.o. male Room/Bed: 028C/028C  Code Status   Code Status: Full Code  Home/SNF/Other Home Patient oriented to: self, place, time, and situation Is this baseline? Yes   Triage Complete: Triage complete  Chief Complaint Acute pancreatitis [K85.90]  Triage Note PT BIB by [facility] from [place] with a c/o of severe abdominal pain.   Allergies Allergies  Allergen Reactions   Tegretol [Carbamazepine] Other (See Comments)    Caused vertigo for 2 days after taking it   Caffeine Palpitations    Level of Care/Admitting Diagnosis ED Disposition     ED Disposition  Admit   Condition  --   Comment  [facility]ospital [facility] [[id number]]  Level of Care: Progressive [102]  Admit to Progressive based on following criteria: GI, ENDOCRINE disease patients with GI bleeding, acute liver failure or pancreatitis, stable with diabetic ketoacidosis or thyrotoxicosis (hypothyroid) state.  Admit to Progressive based on following criteria: Other see comments  Comments: etoh abuse  May admit patient to [name] or [facility] if equivalent level of care is available:: Yes  Covid Evaluation: Asymptomatic - no recent exposure (last 10 days) testing not required  Diagnosis: Acute pancreatitis [577.0.ICD-9-CM]  Admitting Physician: Lurline Del [[id number]]  Attending Physician: Lurline Del [[id number]]  Certification:: I certify this patient will need inpatient services for at least 2 midnights  Expected Medical Readiness: 11/02/2023          B Medical/Surgery History Past Medical History:  Diagnosis Date   Alcohol abuse    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   Anxiety    Bipolar 2 disorder (HCC)    HIV (human immunodeficiency virus infection) (HCC)    Pancreatitis    PTSD (post-traumatic stress disorder)    Schizophrenia (HCC)    Seizures  (HCC)    Subdural hematoma (HCC)    Past Surgical History:  Procedure Laterality Date   BIOPSY  04/19/2022   Procedure: BIOPSY;  Surgeon: Lemar Lofty., MD;  Location[facility] ENDOSCOPY;  Service: Gastroenterology;;   ENTEROSCOPY N/A 04/19/2022   Procedure: ENTEROSCOPY;  Surgeon: Lemar Lofty., MD;  Location[facility] ENDOSCOPY;  Service: Gastroenterology;  Laterality: N/A;   INCISION AND DRAINAGE PERIRECTAL ABSCESS N/A 09/24/2016   Procedure: IRRIGATION AND DEBRIDEMENT PERIRECTAL ABSCESS;  Surgeon: Ricarda Frame, MD;  Location: [facility] ORS;  Service: General;  Laterality: N/A;   none       A IV Location/Drains/Wounds Patient Lines/Drains/Airways Status     Active Line/Drains/Airways     Name Placement date Placement time Site Days   Peripheral IV 10/31/23 22 G 1.75" Anterior;Right Forearm 10/31/23  2250  Forearm  1            Intake/Output Last 24 hours  Intake/Output Summary (Last 24 hours) at 11/01/2023 0037 Last data filed at 10/31/2023 2324 Gross per 24 hour  Intake 1000 ml  Output --  Net 1000 ml    Labs/Imaging Results for orders placed or performed during the hospital encounter of 10/31/23 (from the past 48 hours)  CBC with Differential     Status: Abnormal   Collection Time: 10/31/23  7:42 PM  Result Value Ref Range   WBC 10.6 (H) 4.0 - 10.5 K/uL   RBC 4.01 (L) 4.22 - 5.81 MIL/uL   Hemoglobin 12.3 (L) 13.0 - 17.0 g/dL   HCT 46.9 (L) 62.9 - 52.8 %  MCV 96.3 80.0 - 100.0 fL   MCH 30.7 26.0 - 34.0 pg   MCHC 31.9 30.0 - 36.0 g/dL   RDW 32.4 (H) 40.1 - 02.7 %   Platelets 307 150 - 400 K/uL    Comment: REPEATED TO VERIFY   nRBC 0.0 0.0 - 0.2 %   Neutrophils Relative % 80 %   Neutro Abs 8.6 (H) 1.7 - 7.7 K/uL   Lymphocytes Relative 13 %   Lymphs Abs 1.3 0.7 - 4.0 K/uL   Monocytes Relative 5 %   Monocytes Absolute 0.6 0.1 - 1.0 K/uL   Eosinophils Relative 0 %   Eosinophils Absolute 0.0 0.0 - 0.5 K/uL   Basophils Relative 1 %   Basophils Absolute  0.1 0.0 - 0.1 K/uL   Immature Granulocytes 1 %   Abs Immature Granulocytes 0.06 0.00 - 0.07 K/uL    Comment: Performed at [facility], [address] [place], [place] [address]  Comprehensive metabolic panel     Status: Abnormal   Collection Time: 10/31/23  7:42 PM  Result Value Ref Range   Sodium 143 135 - 145 mmol/L   Potassium 4.0 3.5 - 5.1 mmol/L   Chloride 101 98 - 111 mmol/L   CO2 16 (L) 22 - 32 mmol/L   Glucose, Bld 138 (H) 70 - 99 mg/dL    Comment: Glucose reference range applies only to samples taken after fasting for at least 8 hours.   BUN 10 6 - 20 mg/dL   Creatinine, Ser 4.40 0.61 - 1.24 mg/dL   Calcium 9.1 8.9 - 34.7 mg/dL   Total Protein 8.3 (H) 6.5 - 8.1 g/dL   Albumin 4.0 3.5 - 5.0 g/dL   AST 49 (H) 15 - 41 U/L   ALT 30 0 - 44 U/L   Alkaline Phosphatase 94 38 - 126 U/L   Total Bilirubin 0.9 <1.2 mg/dL   GFR, Estimated >42 >59 mL/min    Comment: (NOTE) Calculated using the CKD-EPI Creatinine Equation (2021)    Anion gap 26 (H) 5 - 15    Comment: ELECTROLYTES REPEATED TO VERIFY Performed at [facility], [address], [place], [place] [address]   Lipase, blood     Status: Abnormal   Collection Time: 10/31/23  7:42 PM  Result Value Ref Range   Lipase 431 (H) 11 - 51 U/L    Comment: RESULTS CONFIRMED BY MANUAL DILUTION Performed at [facility], [address] [place], [place] [address]   Ethanol     Status: Abnormal   Collection Time: 10/31/23  9:00 PM  Result Value Ref Range   Alcohol, Ethyl (B) 287 (H) <10 mg/dL    Comment: (NOTE) Lowest detectable limit for serum alcohol is 10 mg/dL.  For medical purposes only. Performed at [facility], [address] [place], [place] [address]    No results found.  Pending Labs Unresulted Labs (From admission, onward)     Start     Ordered   11/01/23 0500  CBC  Tomorrow morning,   R        11/01/23 0026   11/01/23 0500  Comprehensive metabolic panel  Tomorrow morning,   R         11/01/23 0026   11/01/23 0026  Urinalysis, w/ Reflex to Culture (Infection Suspected) -Urine, Clean Catch  (Urine Culture)  Once,   R       Question:  Specimen Source  Answer:  Urine, Clean  Catch   11/01/23 0026   11/01/23 0025  Magnesium  Once,   R        11/01/23 0026   11/01/23 0024  CBC  (heparin)  Once,   R       Comments: Baseline for heparin therapy IF NOT ALREADY DRAWN.  Notify MD if PLT < 100 K.    11/01/23 0026   11/01/23 0024  Creatinine, serum  (heparin)  Once,   R       Comments: Baseline for heparin therapy IF NOT ALREADY DRAWN.    11/01/23 0026   10/31/23 2359  Blood gas, venous  Once,   R        10/31/23 2358   10/31/23 2358  Lactic acid, plasma  (Lactic Acid)  STAT Now then every 3 hours,   R (with STAT occurrences)      10/31/23 2358            Vitals/Pain Today's Vitals   10/31/23 2200 10/31/23 2230 10/31/23 2253 10/31/23 2256  BP: 106/77 111/86  120/82  Pulse: (!) 130 (!) 132  (!) 136  Resp: (!) 30 (!) 35    Temp:      TempSrc:      SpO2: 99% 100%    Weight:      Height:      PainSc:   10-Worst pain ever     Isolation Precautions No active isolations  Medications Medications  levETIRAcetam (KEPPRA) IVPB 500 mg/100 mL premix (has no administration in time range)  heparin injection 5,000 Units (has no administration in time range)  0.9 %  sodium chloride infusion (has no administration in time range)  acetaminophen (TYLENOL) tablet 650 mg (has no administration in time range)    Or  acetaminophen (TYLENOL) suppository 650 mg (has no administration in time range)  oxyCODONE (Oxy IR/ROXICODONE) immediate release tablet 5 mg (has no administration in time range)  HYDROmorphone (DILAUDID) injection 0.5-1 mg (has no administration in time range)  promethazine (PHENERGAN) tablet 12.5 mg (has no administration in time range)  albuterol (PROVENTIL) (2.5 MG/3ML) 0.083% nebulizer solution 2.5 mg (has no administration in time range)  sodium chloride  0.9 % bolus 1,000 mL (0 mLs Intravenous Stopped 10/31/23 2324)  ondansetron (ZOFRAN-ODT) disintegrating tablet 4 mg (4 mg Oral Given 10/31/23 2035)  oxyCODONE (Oxy IR/ROXICODONE) immediate release tablet 10 mg (10 mg Oral Given 10/31/23 2034)  LORazepam (ATIVAN) injection 1 mg (1 mg Intravenous Given 10/31/23 2301)  ondansetron (ZOFRAN) injection 4 mg (4 mg Intravenous Given 10/31/23 2301)    Mobility walks     Focused Assessments Cardiac Assessment Handoff:    Lab Results  Component Value Date   CKTOTAL 242 05/24/2023   TROPONINI <0.03 10/04/2016   No results found for: "DDIMER" Does the Patient currently have chest pain? No    R Recommendations: See Admitting Provider Note  Report given to:   Additional Notes:

## 2023-11-01 NOTE — Progress Notes (Addendum)
PROGRESS NOTE    Gary [name]  ZOX:[id number] DOB: 1986-01-02 DOA: 10/31/2023 PCP: Default, Provider, MD  Brief Narrative: HPI per admitting provider  37 y.o. male with medical history significant of  alcohol abuse, recurrent pancreatitis, HIV, seizure disorder noncompliance with his keppra, mesenteric thrombus off OAC, multiple admissions for alcohol abuse and recurrent pancreatitis, drug seeking behavior, with recent hospitalization 12/15-12/19/2024 where he was treated for recurrent acute alcoholic pancreatitis but signed out AMA. Patient now presents with complaint of refractory n/v associated with abdominal pain similar to previous  hospitalization. Patient also notes continue etoh abuse. On initial evaluation in ED  patient was noted to have a CIWA of 14.  Patient states last drink was  the am prior to presentation. He notes he drinks daily. He however states he would like to quit and is interested in inpatient etoh rehab. Patient noted he still has  abdominal pain despite pain medications oral/ iv given in ED. He also noted continued nausea.  He also notes feeling anxious and tremulous. He however notes no chest pain/ sob or diarrhea, but does endorse chronic back pain and chronic knee pain.  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 Chest x-ray and KUB shows no acute cardiopulmonary disease elevation of the left hemidiaphragm with significant gaseous distention of the stomach.  Assessment & Plan:   Active Problems:   Metabolic acidosis with increased anion gap and accumulation of organic acids   Acute alcoholic pancreatitis   Alcohol dependence with withdrawal (HCC)   HIV (human immunodeficiency virus infection) (HCC)   Alcohol abuse   Seizure disorder (HCC)   Hypomagnesemia   Alcoholic fatty liver   Acute on  chronic pancreatitis (HCC)   Acute pancreatitis   Nausea and vomiting   Acute on chronic recurrent Alcoholic pancreatitis -continue n.p.o. and IV fluids.  Patient had a CT abdomen 2 days prior to admission so this was not repeated. Lipase 431 on admission, Lipase from today pending.  ETOH abuse with intoxication with anion gap acidosis-EtOH on admission to 87.  ER nurse reported his has been requiring Ativan every hour.  His CIWA was as high as 14. Urine shows ketones, glucose and protein Continue IV fluids.  Acidosis improving. Gap 16 down from 26 on admission. Lactate 2.1 will repeat to ensure his trending down VBG 7.3 7/48/52   Seizure d/o  -probable related to ETOH abuse  - on keppra 500 mg po bid  -place on seizure precautions    HIV -Continue on anti-viral medications  -last CD4 count 566, and HIV-1 RNA quant 4520 per chart improved from prior -patient notes his compliance with his HIV medications   Hypomagnesemia -Mag 1.6, will replete, potassium 4.1,  Estimated body mass index is 20.33 kg/m as calculated from the following:   Height as of this encounter: 6' (1.829 m).   Weight as of this encounter: 68 kg.  DVT prophylaxis: Heparin  code Status: Full code Family Communication: None  disposition Plan:  Status is: Inpatient Remains inpatient appropriate because: Acute pancreatitis alcohol withdrawal   Consultants:  None  Procedures: None Antimicrobials: None Subjective: Patient seen in the ED with complaints of abdominal pain still no nausea vomiting reported had a bowel movement yesterday reports more of constipation than diarrhea drinks half a gallon of vodka daily whole day  Objective: Vitals:   11/01/23 0930 11/01/23 0945  11/01/23 1000 11/01/23 1012  BP: 103/74 104/73 111/74   Pulse: (!) 104 100 100 (!) 106  Resp: 14 20 13    Temp:      TempSrc:      SpO2: 98% 97% 97%   Weight:      Height:        Intake/Output Summary (Last 24 hours) at 11/01/2023  1059 Last data filed at 11/01/2023 0201 Gross per 24 hour  Intake 1180 ml  Output --  Net 1180 ml   Filed Weights   10/31/23 2020  Weight: 68 kg    Examination:  General exam: Appears chronically ill-appearing Respiratory system: Clear to auscultation. Respiratory effort normal. Cardiovascular system: S1 & S2 heard, RRR. No JVD, murmurs, rubs, gallops or clicks. No pedal edema. Gastrointestinal system: Abdomen is nondistended, soft and nontender. No organomegaly or masses felt. Normal bowel sounds heard. Central nervous system: Alert and oriented. Extremities: no edema  Data Reviewed: I have personally reviewed following labs and imaging studies  CBC: Recent Labs  Lab 10/26/23 0938 10/27/23 0637 10/28/23 1846 10/31/23 1942 11/01/23 0343  WBC 2.9* 2.9* 7.2 10.6* 11.9*  NEUTROABS 1.5* 1.2* 5.2 8.6*  --   HGB 10.7* 11.5* 13.3 12.3* 13.7  HCT 32.2* 34.7* 39.0 38.6* 42.0  MCV 93.6 94.6 94.0 96.3 95.5  PLT 338 287 381 307 372   Basic Metabolic Panel: Recent Labs  Lab 10/26/23 0938 10/27/23 0637 10/28/23 1846 10/31/23 1942 11/01/23 0343  NA 134* 135 137 143 141  K 3.4* 3.6 3.2* 4.0 4.1  CL 101 107 104 101 104  CO2 27 21* 24 16* 21*  GLUCOSE 167* 105* 124* 138* 126*  BUN <5* <5* <5* 10 10  CREATININE 0.58* 0.55* 0.67 0.86 1.01  CALCIUM 8.6* 8.3* 9.2 9.1 7.8*  MG  --  1.7 2.2  --  1.6*  PHOS  --  4.3  --   --   --    GFR: Estimated Creatinine Clearance: 96.3 mL/min (by C-G formula based on SCr of 1.01 mg/dL). Liver Function Tests: Recent Labs  Lab 10/26/23 0938 10/27/23 0637 10/28/23 1846 10/31/23 1942 11/01/23 0343  AST 30 24 23  49* 33  ALT 23 18 20 30 23   ALKPHOS 58 57 67 94 73  BILITOT 0.9 0.7 0.4 0.9 0.7  PROT 5.4* 5.6* 7.4 8.3* 6.6  ALBUMIN 2.6* 2.6* 3.6 4.0 3.2*   Recent Labs  Lab 10/26/23 0938 10/28/23 1846 10/31/23 1942  LIPASE 90* 97* 431*   No results for input(s): "AMMONIA" in the last 168 hours. Coagulation Profile: No results for  input(s): "INR", "PROTIME" in the last 168 hours. Cardiac Enzymes: No results for input(s): "CKTOTAL", "CKMB", "CKMBINDEX", "TROPONINI" in the last 168 hours. BNP (last 3 results) No results for input(s): "PROBNP" in the last 8760 hours. HbA1C: No results for input(s): "HGBA1C" in the last 72 hours. CBG: Recent Labs  Lab 10/26/23 2038 10/27/23 0808 10/27/23 1155 10/28/23 0648 11/01/23 0826  GLUCAP 139* 113* 145* 90 90   Lipid Profile: No results for input(s): "CHOL", "HDL", "LDLCALC", "TRIG", "CHOLHDL", "LDLDIRECT" in the last 72 hours. Thyroid Function Tests: No results for input(s): "TSH", "T4TOTAL", "FREET4", "T3FREE", "THYROIDAB" in the last 72 hours. Anemia Panel: No results for input(s): "VITAMINB12", "FOLATE", "FERRITIN", "TIBC", "IRON", "RETICCTPCT" in the last 72 hours. Sepsis Labs: Recent Labs  Lab 11/01/23 0342  LATICACIDVEN 2.1*    No results found for this or any previous visit (from the past 240 hours).    Radiology Studies:  Acute Abdominal Series Result Date: 11/01/2023 CLINICAL DATA:  Nausea EXAM: DG ABDOMEN ACUTE WITH 1 VIEW CHEST COMPARISON:  09/30/2023 FINDINGS: Elevated left hemidiaphragm. Significant gaseous distention of the stomach. No confluent airspace opacities or effusions. Heart mediastinal contours within normal limits. No acute bony abnormality. IMPRESSION: No acute cardiopulmonary disease. Elevation of the left hemidiaphragm with significant gaseous distention of the stomach. Electronically Signed   By: Charlett Nose M.D.   On: 11/01/2023 01:12     Scheduled Meds:  bictegravir-emtricitabine-tenofovir AF  1 tablet Oral Daily   folic acid  1 mg Oral Daily   heparin  5,000 Units Subcutaneous Q8H   multivitamin with minerals  1 tablet Oral Daily   ondansetron  4 mg Intravenous Once   pantoprazole  40 mg Oral Daily   thiamine  100 mg Oral Daily   Or   thiamine  100 mg Intravenous Daily   Continuous Infusions:  sodium chloride 150 mL/hr at  11/01/23 0110   levETIRAcetam Stopped (11/01/23 0201)     LOS: 1 day    Time spent: 39 min Alwyn Ren, MD 11/01/2023, 10:59 AM

## 2023-11-02 DIAGNOSIS — G40909 Epilepsy, unspecified, not intractable, without status epilepticus: Secondary | ICD-10-CM

## 2023-11-02 DIAGNOSIS — F1010 Alcohol abuse, uncomplicated: Secondary | ICD-10-CM

## 2023-11-02 DIAGNOSIS — K859 Acute pancreatitis without necrosis or infection, unspecified: Secondary | ICD-10-CM | POA: Diagnosis not present

## 2023-11-02 DIAGNOSIS — K861 Other chronic pancreatitis: Secondary | ICD-10-CM | POA: Diagnosis not present

## 2023-11-02 LAB — COMPREHENSIVE METABOLIC PANEL
ALT: 18 U/L (ref 0–44)
AST: 28 U/L (ref 15–41)
Albumin: 2.6 g/dL — ABNORMAL LOW (ref 3.5–5.0)
Alkaline Phosphatase: 52 U/L (ref 38–126)
Anion gap: 7 (ref 5–15)
BUN: 5 mg/dL — ABNORMAL LOW (ref 6–20)
CO2: 24 mmol/L (ref 22–32)
Calcium: 8 mg/dL — ABNORMAL LOW (ref 8.9–10.3)
Chloride: 106 mmol/L (ref 98–111)
Creatinine, Ser: 0.76 mg/dL (ref 0.61–1.24)
GFR, Estimated: >60 mL/min (ref >60)
Glucose, Bld: 94 mg/dL (ref 70–99)
Potassium: 4 mmol/L (ref 3.5–5.1)
Sodium: 137 mmol/L (ref 135–145)
Total Bilirubin: 1.1 mg/dL (ref <1.2)
Total Protein: 5.1 g/dL — ABNORMAL LOW (ref 6.5–8.1)

## 2023-11-02 LAB — CBC
HCT: 31.9 % — ABNORMAL LOW (ref 39.0–52.0)
Hemoglobin: 10.5 g/dL — ABNORMAL LOW (ref 13.0–17.0)
MCH: 31.1 pg (ref 26.0–34.0)
MCHC: 32.9 g/dL (ref 30.0–36.0)
MCV: 94.4 fL (ref 80.0–100.0)
Platelets: 175 10*3/uL (ref 150–400)
RBC: 3.38 MIL/uL — ABNORMAL LOW (ref 4.22–5.81)
RDW: 15.8 % — ABNORMAL HIGH (ref 11.5–15.5)
WBC: 4.4 10*3/uL (ref 4.0–10.5)
nRBC: 0 % (ref 0.0–0.2)

## 2023-11-02 LAB — URINE CULTURE

## 2023-11-02 LAB — LIPASE, BLOOD: Lipase: 128 U/L — ABNORMAL HIGH (ref 11–51)

## 2023-11-02 LAB — GLUCOSE, CAPILLARY
Glucose-Capillary: 84 mg/dL (ref 70–99)
Glucose-Capillary: 99 mg/dL (ref 70–99)
Glucose-Capillary: 109 mg/dL — ABNORMAL HIGH (ref 70–99)
Glucose-Capillary: 96 mg/dL (ref 70–99)

## 2023-11-02 LAB — MAGNESIUM: Magnesium: 2.1 mg/dL (ref 1.7–2.4)

## 2023-11-02 LAB — PHOSPHORUS: Phosphorus: 2.5 mg/dL (ref 2.5–4.6)

## 2023-11-02 MED ORDER — LEVETIRACETAM 500 MG PO TABS
500.0000 mg | ORAL_TABLET | Freq: Two times a day (BID) | ORAL | Status: DC
  Administered 2023-11-02 – 2023-11-06 (×9): 500 mg via ORAL
  Filled 2023-11-02 (×9): qty 1

## 2023-11-02 MED ORDER — SODIUM CHLORIDE 0.9 % IV SOLN: INTRAVENOUS | Status: AC

## 2023-11-02 MED ORDER — DOCUSATE SODIUM 100 MG PO CAPS
100.0000 mg | ORAL_CAPSULE | Freq: Two times a day (BID) | ORAL | Status: DC
  Administered 2023-11-02 – 2023-11-06 (×8): 100 mg via ORAL
  Filled 2023-11-02 (×9): qty 1

## 2023-11-02 NOTE — Plan of Care (Signed)

## 2023-11-02 NOTE — Progress Notes (Signed)
[facility] PROGRESS NOTE   Gary [name] ZOX:[id number] DOB: 05-20-1986 DOA: 10/31/2023  PCP: Default, Provider, MD  Brief History: 37 y.o. male with medical history significant for alcohol abuse, recurrent pancreatitis, HIV, seizure disorder noncompliance with his keppra, mesenteric thrombus off OAC, multiple admissions for alcohol abuse and recurrent pancreatitis, drug seeking behavior, with recent hospitalization 12/15-12/19/2024 where he was treated for recurrent acute alcoholic pancreatitis but signed out AMA.  Patient presented with nausea vomiting and abdominal pain.  Was found to have elevated lipase level.  He was hospitalized for further management.    Consultants: None  Procedures: None    Subjective/Interval History: Complains of 9 out of 10 abdominal pain in the upper abdomen.  Some nausea but no vomiting.  No other complaints offered.    Assessment/Plan:  Acute on chronic alcoholic pancreatitis Lipase level was elevated at 431.  Had a recent CT scan 2 days prior to this admission on 12/19 which showed slightly improved severe pancreatitis compared to 12/7.  Will check lipase level.  Supportive treatment.  Continue clear liquids for today.  Mobilize.  Alcohol abuse Continues to drink heavily.  Continue CIWA protocol.  Alcohol level on admission was 87.  Thiamine folic acid multivitamins.  Normocytic anemia Drop in hemoglobin is dilutional.  Recheck labs tomorrow.  Seizure disorder Probably related to alcohol abuse.  Not very compliant with his Keppra.  Continue Keppra for now.  History of HIV Last CD4 count was 5 and 66.  Noted to be on Biktarvy.  Hypomagnesemia Supplemented   DVT Prophylaxis: Subcutaneous heparin Code Status: Full code Family Communication: Discussed with patient Disposition Plan: Hopefully discharge in 24 to 48 hours  Status is: Inpatient Remains inpatient appropriate because: Acute pancreatitis      Medications:  Scheduled:  bictegravir-emtricitabine-tenofovir AF  1 tablet Oral Daily   folic acid  1 mg Oral Daily   heparin  5,000 Units Subcutaneous Q8H   multivitamin with minerals  1 tablet Oral Daily   ondansetron  4 mg Intravenous Once   pantoprazole  40 mg Oral Daily   thiamine  100 mg Oral Daily   Or   thiamine  100 mg Intravenous Daily   Continuous:  levETIRAcetam 500 mg (11/02/23 0217)   WJX:BJYNWGNFAOZHY **OR** acetaminophen, albuterol, HYDROmorphone (DILAUDID) injection, LORazepam **OR** LORazepam, ondansetron (ZOFRAN) IV, oxyCODONE, promethazine  Antibiotics: Anti-infectives (From admission, onward)    Start     Dose/Rate Route Frequency Ordered Stop   11/01/23 0800  bictegravir-emtricitabine-tenofovir AF (BIKTARVY) 50-200-25 MG per tablet 1 tablet        1 tablet Oral Daily 11/01/23 0451         Objective:  Vital Signs  Vitals:   11/01/23 2351 11/02/23 0200 11/02/23 0356 11/02/23 0400  BP: 96/67  91/67 108/74  Pulse: 66 69 82 68  Resp: 11  14 11   Temp: 98.5 F (36.9 C)  98.4 F (36.9 C)   TempSrc: Oral  Oral   SpO2: 94%  94% 96%  Weight:      Height:        Intake/Output Summary (Last 24 hours) at 11/02/2023 0843 Last data filed at 11/02/2023 0217 Gross per 24 hour  Intake 2005.97 ml  Output 700 ml  Net 1305.97 ml   Filed Weights   10/31/23 2020  Weight: 68 kg    General appearance: Awake alert.  In no distress Resp: Clear to auscultation bilaterally.  Normal effort Cardio: S1-S2 is normal regular.  No S3-S4.  No  rubs murmurs or bruit GI: Abdomen is soft.   tender in the epigastric area without any rebound rigidity or guarding.  Bowel sounds sluggish. Extremities: No edema.  Full range of motion of lower extremities. Neurologic: Alert and oriented x3.  No focal neurological deficits.    Lab Results:  Data Reviewed: I have personally reviewed following labs and reports of the imaging studies  CBC: Recent Labs  Lab 10/26/23 0938 10/27/23 0637  10/28/23 1846 10/31/23 1942 11/01/23 0343 11/02/23 0431  WBC 2.9* 2.9* 7.2 10.6* 11.9* 4.4  NEUTROABS 1.5* 1.2* 5.2 8.6*  --   --   HGB 10.7* 11.5* 13.3 12.3* 13.7 10.5*  HCT 32.2* 34.7* 39.0 38.6* 42.0 31.9*  MCV 93.6 94.6 94.0 96.3 95.5 94.4  PLT 338 287 381 307 372 175    Basic Metabolic Panel: Recent Labs  Lab 10/27/23 0637 10/28/23 1846 10/31/23 1942 11/01/23 0343 11/02/23 0431  NA 135 137 143 141 137  K 3.6 3.2* 4.0 4.1 4.0  CL 107 104 101 104 106  CO2 21* 24 16* 21* 24  GLUCOSE 105* 124* 138* 126* 94  BUN <5* <5* 10 10 5*  CREATININE 0.55* 0.67 0.86 1.01 0.76  CALCIUM 8.3* 9.2 9.1 7.8* 8.0*  MG 1.7 2.2  --  1.6* 2.1  PHOS 4.3  --   --   --  2.5    GFR: Estimated Creatinine Clearance: 121.6 mL/min (by C-G formula based on SCr of 0.76 mg/dL).  Liver Function Tests: Recent Labs  Lab 10/27/23 0637 10/28/23 1846 10/31/23 1942 11/01/23 0343 11/02/23 0431  AST 24 23 49* 33 28  ALT 18 20 30 23 18   ALKPHOS 57 67 94 73 52  BILITOT 0.7 0.4 0.9 0.7 1.1  PROT 5.6* 7.4 8.3* 6.6 5.1*  ALBUMIN 2.6* 3.6 4.0 3.2* 2.6*    Recent Labs  Lab 10/26/23 0938 10/28/23 1846 10/31/23 1942  LIPASE 90* 97* 431*    CBG: Recent Labs  Lab 11/01/23 0826 11/01/23 1419 11/01/23 1629 11/01/23 2108 11/02/23 0811  GLUCAP 90 120* 131* 100* 84     Radiology Studies: Acute Abdominal Series Result Date: 11/01/2023 CLINICAL DATA:  Nausea EXAM: DG ABDOMEN ACUTE WITH 1 VIEW CHEST COMPARISON:  09/30/2023 FINDINGS: Elevated left hemidiaphragm. Significant gaseous distention of the stomach. No confluent airspace opacities or effusions. Heart mediastinal contours within normal limits. No acute bony abnormality. IMPRESSION: No acute cardiopulmonary disease. Elevation of the left hemidiaphragm with significant gaseous distention of the stomach. Electronically Signed   By: Charlett Nose M.D.   On: 11/01/2023 01:12       LOS: 2 days   Gary [name] Gary [name]  [facility] Pager on  [email or web address]  11/02/2023, 8:43 AM

## 2023-11-03 DIAGNOSIS — G40909 Epilepsy, unspecified, not intractable, without status epilepticus: Secondary | ICD-10-CM | POA: Diagnosis not present

## 2023-11-03 DIAGNOSIS — K859 Acute pancreatitis without necrosis or infection, unspecified: Secondary | ICD-10-CM | POA: Diagnosis not present

## 2023-11-03 DIAGNOSIS — K861 Other chronic pancreatitis: Secondary | ICD-10-CM | POA: Diagnosis not present

## 2023-11-03 DIAGNOSIS — F1010 Alcohol abuse, uncomplicated: Secondary | ICD-10-CM | POA: Diagnosis not present

## 2023-11-03 LAB — COMPREHENSIVE METABOLIC PANEL
ALT: 21 U/L (ref 0–44)
AST: 28 U/L (ref 15–41)
Albumin: 2.8 g/dL — ABNORMAL LOW (ref 3.5–5.0)
Alkaline Phosphatase: 55 U/L (ref 38–126)
Anion gap: 6 (ref 5–15)
BUN: <5 mg/dL — ABNORMAL LOW (ref 6–20)
CO2: 31 mmol/L (ref 22–32)
Calcium: 8.9 mg/dL (ref 8.9–10.3)
Chloride: 100 mmol/L (ref 98–111)
Creatinine, Ser: 0.71 mg/dL (ref 0.61–1.24)
GFR, Estimated: >60 mL/min (ref >60)
Glucose, Bld: 86 mg/dL (ref 70–99)
Potassium: 3.7 mmol/L (ref 3.5–5.1)
Sodium: 137 mmol/L (ref 135–145)
Total Bilirubin: 0.6 mg/dL (ref <1.2)
Total Protein: 5.8 g/dL — ABNORMAL LOW (ref 6.5–8.1)

## 2023-11-03 LAB — GLUCOSE, CAPILLARY
Glucose-Capillary: 88 mg/dL (ref 70–99)
Glucose-Capillary: 102 mg/dL — ABNORMAL HIGH (ref 70–99)
Glucose-Capillary: 82 mg/dL (ref 70–99)
Glucose-Capillary: 114 mg/dL — ABNORMAL HIGH (ref 70–99)

## 2023-11-03 LAB — CBC
HCT: 34.2 % — ABNORMAL LOW (ref 39.0–52.0)
Hemoglobin: 11.3 g/dL — ABNORMAL LOW (ref 13.0–17.0)
MCH: 31.4 pg (ref 26.0–34.0)
MCHC: 33 g/dL (ref 30.0–36.0)
MCV: 95 fL (ref 80.0–100.0)
Platelets: 151 10*3/uL (ref 150–400)
RBC: 3.6 MIL/uL — ABNORMAL LOW (ref 4.22–5.81)
RDW: 15.3 % (ref 11.5–15.5)
WBC: 3.1 10*3/uL — ABNORMAL LOW (ref 4.0–10.5)
nRBC: 0 % (ref 0.0–0.2)

## 2023-11-03 LAB — LIPASE, BLOOD: Lipase: 115 U/L — ABNORMAL HIGH (ref 11–51)

## 2023-11-03 MED ORDER — POTASSIUM CHLORIDE CRYS ER 20 MEQ PO TBCR
40.0000 meq | EXTENDED_RELEASE_TABLET | Freq: Once | ORAL | Status: AC
  Administered 2023-11-03: 40 meq via ORAL
  Filled 2023-11-03: qty 2

## 2023-11-03 MED ORDER — SODIUM CHLORIDE 0.9 % IV SOLN: INTRAVENOUS | Status: DC

## 2023-11-03 NOTE — Progress Notes (Signed)
[facility] PROGRESS NOTE   RUFFUS HOPP UJW:[id number] DOB: [birth year] DOA: 10/31/2023  PCP: Default, Provider, MD  Brief History: 37 y.o. male with medical history significant for alcohol abuse, recurrent pancreatitis, HIV, seizure disorder noncompliance with his keppra, mesenteric thrombus off OAC, multiple admissions for alcohol abuse and recurrent pancreatitis, drug seeking behavior, with recent hospitalization 12/15-12/19/2024 where he was treated for recurrent acute alcoholic pancreatitis but signed out AMA.  Patient presented with nausea vomiting and abdominal pain.  Was found to have elevated lipase level.  He was hospitalized for further management.    Consultants: None  Procedures: None    Subjective/Interval History: Continues to have 7 out of 10 pain in the abdomen though he is feeling somewhat better.  Denies any vomiting.  Had some nausea yesterday.  Requesting advancement in his diet.      Assessment/Plan:  Acute on chronic alcoholic pancreatitis Lipase level was elevated at 431.  Had a recent CT scan 2 days prior to this admission on 12/19 which showed slightly improved severe pancreatitis compared to 12/7.   Lipase levels are improving.  Continue supportive treatment.  Advance to soft diet today.  Continue to mobilize.    Alcohol abuse Continues to drink heavily.  Continue CIWA protocol.  Alcohol level on admission was 87.  Thiamine folic acid multivitamins. Patient requesting rehabilitation.  Will consult TOC to assist.    Normocytic anemia Hemoglobin is stable.  No evidence of overt bleeding.  Hypotension Has chronically low blood pressures.  He is asymptomatic.  Continue to monitor.  Seizure disorder Probably related to alcohol abuse.  Not very compliant with his Keppra.  Continue Keppra for now.  History of HIV Last CD4 count was 5 and 66.  Noted to be on Biktarvy.  Hypomagnesemia Supplemented   DVT Prophylaxis: Subcutaneous  heparin Code Status: Full code Family Communication: Discussed with patient Disposition Plan: Hopefully discharge in 24 to 48 hours  Status is: Inpatient Remains inpatient appropriate because: Acute pancreatitis      Medications: Scheduled:  bictegravir-emtricitabine-tenofovir AF  1 tablet Oral Daily   docusate sodium  100 mg Oral BID   folic acid  1 mg Oral Daily   heparin  5,000 Units Subcutaneous Q8H   levETIRAcetam  500 mg Oral BID   multivitamin with minerals  1 tablet Oral Daily   ondansetron  4 mg Intravenous Once   pantoprazole  40 mg Oral Daily   thiamine  100 mg Oral Daily   Or   thiamine  100 mg Intravenous Daily   Continuous:   FAO:ZHYQMVHQIONGE **OR** acetaminophen, albuterol, HYDROmorphone (DILAUDID) injection, LORazepam **OR** LORazepam, ondansetron (ZOFRAN) IV, oxyCODONE, promethazine  Antibiotics: Anti-infectives (From admission, onward)    Start     Dose/Rate Route Frequency Ordered Stop   11/01/23 0800  bictegravir-emtricitabine-tenofovir AF (BIKTARVY) 50-200-25 MG per tablet 1 tablet        1 tablet Oral Daily 11/01/23 0451         Objective:  Vital Signs  Vitals:   11/03/23 0350 11/03/23 0400 11/03/23 0654 11/03/23 0700  BP: 102/73 96/70 103/87 103/87  Pulse: 69 70 (!) 57 79  Resp: 13 10  10   Temp: 98.3 F (36.8 C) 98 F (36.7 C)    TempSrc: Oral     SpO2: 93% 94%  95%  Weight:      Height:        Intake/Output Summary (Last 24 hours) at 11/03/2023 1029 Last data filed at 11/03/2023 0528 Gross per  24 hour  Intake 2128.86 ml  Output 3309 ml  Net -1180.14 ml   Filed Weights   10/31/23 2020  Weight: 68 kg    General appearance: Awake alert.  In no distress Resp: Clear to auscultation bilaterally.  Normal effort Cardio: S1-S2 is normal regular.  No S3-S4.  No rubs murmurs or bruit GI: Abdomen is soft.  Tender in the epigastric area without any rebound rigidity or guarding.  No masses organomegaly. Extremities: No edema.  Full  range of motion of lower extremities. Neurologic: Alert and oriented x3.  No focal neurological deficits.     Lab Results:  Data Reviewed: I have personally reviewed following labs and reports of the imaging studies  CBC: Recent Labs  Lab 10/28/23 1846 10/31/23 1942 11/01/23 0343 11/02/23 0431 11/03/23 0500  WBC 7.2 10.6* 11.9* 4.4 3.1*  NEUTROABS 5.2 8.6*  --   --   --   HGB 13.3 12.3* 13.7 10.5* 11.3*  HCT 39.0 38.6* 42.0 31.9* 34.2*  MCV 94.0 96.3 95.5 94.4 95.0  PLT 381 307 372 175 151    Basic Metabolic Panel: Recent Labs  Lab 10/28/23 1846 10/31/23 1942 11/01/23 0343 11/02/23 0431 11/03/23 0500  NA 137 143 141 137 137  K 3.2* 4.0 4.1 4.0 3.7  CL 104 101 104 106 100  CO2 24 16* 21* 24 31  GLUCOSE 124* 138* 126* 94 86  BUN <5* 10 10 5* <5*  CREATININE 0.67 0.86 1.01 0.76 0.71  CALCIUM 9.2 9.1 7.8* 8.0* 8.9  MG 2.2  --  1.6* 2.1  --   PHOS  --   --   --  2.5  --     GFR: Estimated Creatinine Clearance: 121.6 mL/min (by C-G formula based on SCr of 0.71 mg/dL).  Liver Function Tests: Recent Labs  Lab 10/28/23 1846 10/31/23 1942 11/01/23 0343 11/02/23 0431 11/03/23 0500  AST 23 49* 33 28 28  ALT 20 30 23 18 21   ALKPHOS 67 94 73 52 55  BILITOT 0.4 0.9 0.7 1.1 0.6  PROT 7.4 8.3* 6.6 5.1* 5.8*  ALBUMIN 3.6 4.0 3.2* 2.6* 2.8*    Recent Labs  Lab 10/28/23 1846 10/31/23 1942 11/02/23 0431 11/03/23 0500  LIPASE 97* 431* 128* 115*    CBG: Recent Labs  Lab 11/02/23 0811 11/02/23 1127 11/02/23 1644 11/02/23 2120 11/03/23 0829  GLUCAP 84 99 109* 96 88     Radiology Studies: No results found.      LOS: 3 days   Kikue Gerhart [employer] on [email or web address]  11/03/2023, 10:29 AM

## 2023-11-03 NOTE — Plan of Care (Signed)

## 2023-11-04 DIAGNOSIS — K859 Acute pancreatitis without necrosis or infection, unspecified: Secondary | ICD-10-CM | POA: Diagnosis not present

## 2023-11-04 DIAGNOSIS — F1010 Alcohol abuse, uncomplicated: Secondary | ICD-10-CM | POA: Diagnosis not present

## 2023-11-04 DIAGNOSIS — K861 Other chronic pancreatitis: Secondary | ICD-10-CM | POA: Diagnosis not present

## 2023-11-04 DIAGNOSIS — G40909 Epilepsy, unspecified, not intractable, without status epilepticus: Secondary | ICD-10-CM | POA: Diagnosis not present

## 2023-11-04 LAB — COMPREHENSIVE METABOLIC PANEL
ALT: 19 U/L (ref 0–44)
AST: 23 U/L (ref 15–41)
Albumin: 2.7 g/dL — ABNORMAL LOW (ref 3.5–5.0)
Alkaline Phosphatase: 53 U/L (ref 38–126)
Anion gap: 7 (ref 5–15)
BUN: 6 mg/dL (ref 6–20)
CO2: 29 mmol/L (ref 22–32)
Calcium: 9 mg/dL (ref 8.9–10.3)
Chloride: 101 mmol/L (ref 98–111)
Creatinine, Ser: 0.97 mg/dL (ref 0.61–1.24)
GFR, Estimated: >60 mL/min (ref >60)
Glucose, Bld: 116 mg/dL — ABNORMAL HIGH (ref 70–99)
Potassium: 4.2 mmol/L (ref 3.5–5.1)
Sodium: 137 mmol/L (ref 135–145)
Total Bilirubin: 0.7 mg/dL (ref <1.2)
Total Protein: 5.9 g/dL — ABNORMAL LOW (ref 6.5–8.1)

## 2023-11-04 LAB — GLUCOSE, CAPILLARY
Glucose-Capillary: 96 mg/dL (ref 70–99)
Glucose-Capillary: 109 mg/dL — ABNORMAL HIGH (ref 70–99)
Glucose-Capillary: 107 mg/dL — ABNORMAL HIGH (ref 70–99)
Glucose-Capillary: 116 mg/dL — ABNORMAL HIGH (ref 70–99)

## 2023-11-04 LAB — LIPASE, BLOOD: Lipase: 68 U/L — ABNORMAL HIGH (ref 11–51)

## 2023-11-04 LAB — MAGNESIUM: Magnesium: 1.6 mg/dL — ABNORMAL LOW (ref 1.7–2.4)

## 2023-11-04 MED ORDER — LORAZEPAM 0.5 MG PO TABS
0.5000 mg | ORAL_TABLET | Freq: Four times a day (QID) | ORAL | Status: DC | PRN
  Administered 2023-11-04 – 2023-11-05 (×2): 0.5 mg via ORAL
  Filled 2023-11-04 (×2): qty 1

## 2023-11-04 MED ORDER — LORAZEPAM 2 MG/ML IJ SOLN
0.5000 mg | Freq: Once | INTRAMUSCULAR | Status: AC
  Administered 2023-11-05: 0.5 mg via INTRAVENOUS
  Filled 2023-11-04: qty 1

## 2023-11-04 MED ORDER — MAGNESIUM SULFATE 4 GM/100ML IV SOLN
4.0000 g | Freq: Once | INTRAVENOUS | Status: AC
  Administered 2023-11-04: 4 g via INTRAVENOUS
  Filled 2023-11-04: qty 100

## 2023-11-04 NOTE — Plan of Care (Signed)
  Problem: Clinical Measurements: Goal: Diagnostic test results will improve Outcome: Progressing Goal: Respiratory complications will improve Outcome: Progressing   Problem: Pain Management: Goal: General experience of comfort will improve Outcome: Progressing   Problem: Safety: Goal: Ability to remain free from injury will improve Outcome: Progressing

## 2023-11-04 NOTE — TOC Initial Note (Signed)
Transition of Care [facility]) - Initial/Assessment Note    Patient Details  Name: Gary [name] MRN: [id number] Date of Birth: [birth year]  Transition of Care [facility]) CM/SW Contact:    Mearl Latin, LCSW Phone Number: 11/04/2023, 2:29 PM  Clinical Narrative:                 CSW received ETOH consult. CSW met with patient and he stated he needs to go to an ETOH detox facility because if he does not, he will go right to an alcohol store. He is requesting benzos be prescribed. CSW discussed outpatient care and patient stated he knows nothing about that. CSW explained that outpatient providers are the best way to obtain a medication regimen once discharged home. Patient stated he cannot afford one. CSW will look into patient's medicaid plan to see what is covered. CSW provided community resources for substance use and homelessness but patient stated that the hospital needed to help him. CSW explained that oftentimes there is a waitlist and facilities likely deny patients due to medical acuity when referrals come from the hospital setting, which is why it is important for patient to call to complete the assessment himself as the facilities will want to talk directly with him. CSW discussed outpatient places like [facility] but patient stated he did not need that or a long term program, only detox for two weeks. Of note, [facility] and [facility] are both in network with patient's insurance. Patient will require bus pass at discharge.    Expected Discharge Plan: Homeless Shelter Barriers to Discharge: Continued Medical Work up   Patient Goals and CMS Choice Patient states their goals for this hospitalization and ongoing recovery are:: Have less pain          Expected Discharge Plan and Services In-house Referral: Clinical Social Work     Living arrangements for the past 2 months: Homeless                                      Prior Living Arrangements/Services Living arrangements for the past 2  months: Homeless Lives with:: Self Patient language and need for interpreter reviewed:: Yes Do you feel safe going back to the place where you live?: Yes      Need for Family Participation in Patient Care: Yes (Comment) Care giver support system in place?: Yes (comment)   Criminal Activity/Legal Involvement Pertinent to Current Situation/Hospitalization: No - Comment as needed  Activities of Daily Living   ADL Screening (condition at time of admission) Independently performs ADLs?: Yes (appropriate for developmental age) Is the patient deaf or have difficulty hearing?: No Does the patient have difficulty seeing, even when wearing glasses/contacts?: No Does the patient have difficulty concentrating, remembering, or making decisions?: Yes  Permission Sought/Granted Permission sought to share information with : Oceanographer granted to share information with : Yes, Verbal Permission Granted              Emotional Assessment Appearance:: Appears stated age Attitude/Demeanor/Rapport: Guarded Affect (typically observed): Appropriate, Guarded Orientation: : Oriented to Self, Oriented to Place, Oriented to  Time, Oriented to Situation Alcohol / Substance Use: Alcohol Use Psych Involvement: No (comment)  Admission diagnosis:  Acute pancreatitis [K85.90] Chronic pancreatitis due to acute alcohol intoxication (HCC) [K86.0, F10.929] Nausea and vomiting, unspecified vomiting type [R11.2] Patient Active Problem List   Diagnosis Date Noted   Nausea and vomiting  11/01/2023   Acute pancreatitis 10/31/2023   Pancreatitis 10/16/2023   Acute on chronic pancreatitis (HCC) 10/16/2023   Recurrent acute pancreatitis 10/09/2023   Macrocytic anemia 09/26/2023   Alcoholic fatty liver 09/25/2023   Protein-calorie malnutrition, severe 12/25/2022   Hypomagnesemia 12/24/2022   Alcohol withdrawal syndrome without complication (HCC) 12/23/2022   Seizure disorder (HCC)  12/17/2022   Pancytopenia (HCC) 12/14/2022   Alcohol-induced pancreatitis 12/14/2022   Delirium tremens (HCC) 10/19/2022   Alcohol abuse with alcohol-induced mood disorder (HCC) 10/16/2022   Alcohol dependence with withdrawal (HCC) 10/16/2022   Abnormal LFTs 10/16/2022   Acute alcoholic pancreatitis 09/11/2022   AKI (acute kidney injury) (HCC) 05/28/2022   Homelessness 05/28/2022   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   Metabolic acidosis with increased anion gap and accumulation of organic acids 04/16/2022   Hypokalemia 01/22/2022   Alcohol-induced insomnia (HCC)    MDD (major depressive disorder), recurrent episode, 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/26/2021   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   Alcohol abuse    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   Malingering 05/15/2020   Malnutrition of moderate degree 05/07/2020   Alcoholic ketoacidosis 05/05/2020   Thrombocytopenia (HCC) 07/25/2019   Traumatic subdural hematoma (HCC) 07/02/2019   Alcoholic intoxication with complication (HCC) 07/02/2019   Hypocalcemia 07/02/2019   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   Alcohol use disorder, severe, dependence (HCC) 04/16/2019   Major depressive disorder, 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 [place] [address] Phone: [phone] Fax: [phone]  [facility]) [phone] - [place], [place] - [address] AT [facility] [address] [place] [address] Phone: [phone] Fax: [phone]  [id number] [place], [place] - [address] AT [facility] OF [address] & [address] [place] [address] Phone: [phone] Fax:  [phone]     Social Drivers of Health (SDOH) Social History: SDOH Screenings   Food Insecurity: Food Insecurity Present (11/01/2023)  Housing: High Risk (11/01/2023)  Transportation Needs: Unmet Transportation Needs (11/01/2023)  Utilities: At Risk (11/01/2023)  Alcohol Screen: High Risk (07/25/2021)  Depression (PHQ2-9): Medium Risk (02/12/2021)  Financial Resource Strain: Patient Declined (10/11/2023)  Social Connections: Unknown (06/24/2022)   [facility] from [facility]  Stress: No Stress Concern Present (06/30/2022)   Received from [facility]  Recent Concern: Stress - Stress Concern Present (06/25/2022)   Received from [facility]  Tobacco Use: High Risk (11/01/2023)   SDOH Interventions:     Readmission Risk Interventions    11/04/2023    2:28 PM 10/25/2023    1:58 PM 10/11/2023   12:26 PM  Readmission Risk Prevention Plan  Transportation Screening Complete Complete Complete  Medication Review (RN Care Manager) Complete Complete Referral to Pharmacy  PCP or Specialist appointment within 3-5 days of discharge Patient refused  Patient refused  HRI or Home Care Consult Complete Complete Patient refused  SW Recovery Care/Counseling Consult Complete Complete Complete  Palliative Care Screening Not Applicable Not Applicable Not Applicable  Skilled Nursing Facility Not Applicable Not Applicable Not Applicable

## 2023-11-04 NOTE — Progress Notes (Signed)
[facility] PROGRESS NOTE   Gary [name] FAO:[id number] DOB: 03-Apr-1986 DOA: 10/31/2023  PCP: Default, Provider, MD  Brief History: 37 y.o. male with medical history significant for alcohol abuse, recurrent pancreatitis, HIV, seizure disorder noncompliance with his keppra, mesenteric thrombus off OAC, multiple admissions for alcohol abuse and recurrent pancreatitis, drug seeking behavior, with recent hospitalization 12/15-12/19/2024 where he was treated for recurrent acute alcoholic pancreatitis but signed out AMA.  Patient presented with nausea vomiting and abdominal pain.  Was found to have elevated lipase level.  He was hospitalized for further management.    Consultants: None  Procedures: None    Subjective/Interval History: Continues to have 6-7 out of 10 pain in the upper abdomen.  Denies any nausea or vomiting.  Passing gas from below.  Has not really ambulated.     Assessment/Plan:  Acute on chronic alcoholic pancreatitis Lipase level was elevated at 431.  Had a recent CT scan 2 days prior to this admission on 12/19 which showed slightly improved severe pancreatitis compared to 12/7.   Lipase levels have improved.  Continues to have significant pain.  Continue with current pain medications without any escalation.  Continue with soft diet.  Mobilize.  Alcohol abuse Continues to drink heavily.  Patient started on CIWA protocol.   Alcohol level on admission was 87.  Thiamine folic acid multivitamins. Patient requesting rehabilitation.  Will consult TOC to assist.   LFTs are normal.  Normocytic anemia Hemoglobin is stable.  No evidence for overt bleeding.  Hypotension Has chronically low blood pressures.  He is asymptomatic.  Continue to monitor.  Seizure disorder Probably related to alcohol abuse.  Not very compliant with his Keppra.  Continue Keppra for now.  History of HIV Last CD4 count was 5 and 66.  Noted to be on Biktarvy.  Hypomagnesemia Continue  to supplement.  Recheck labs tomorrow.   DVT Prophylaxis: Subcutaneous heparin Code Status: Full code Family Communication: Discussed with patient Disposition Plan: Start mobilizing.  Hopefully discharge in 24 to 48 hours.     Medications: Scheduled:  bictegravir-emtricitabine-tenofovir AF  1 tablet Oral Daily   docusate sodium  100 mg Oral BID   folic acid  1 mg Oral Daily   heparin  5,000 Units Subcutaneous Q8H   levETIRAcetam  500 mg Oral BID   multivitamin with minerals  1 tablet Oral Daily   ondansetron  4 mg Intravenous Once   pantoprazole  40 mg Oral Daily   thiamine  100 mg Oral Daily   Or   thiamine  100 mg Intravenous Daily   Continuous:  sodium chloride 75 mL/hr at 11/04/23 0218   magnesium sulfate bolus IVPB 4 g (11/04/23 0816)    ONG:[id number] **OR** acetaminophen, albuterol, HYDROmorphone (DILAUDID) injection, ondansetron (ZOFRAN) IV, oxyCODONE, promethazine  Antibiotics: Anti-infectives (From admission, onward)    Start     Dose/Rate Route Frequency Ordered Stop   11/01/23 0800  bictegravir-emtricitabine-tenofovir AF (BIKTARVY) 50-200-25 MG per tablet 1 tablet        1 tablet Oral Daily 11/01/23 0451         Objective:  Vital Signs  Vitals:   11/04/23 0000 11/04/23 0004 11/04/23 0400 11/04/23 0800  BP: 105/76 105/76 94/64 102/81  Pulse: 67 67 65 88  Resp: 16  16 19   Temp: 98.1 F (36.7 C)  98 F (36.7 C)   TempSrc: Oral  Oral   SpO2: 94%  90% 95%  Weight:      Height:  Intake/Output Summary (Last 24 hours) at 11/04/2023 0925 Last data filed at 11/04/2023 0654 Gross per 24 hour  Intake 1198.58 ml  Output 3800 ml  Net -2601.42 ml   Filed Weights   10/31/23 2020  Weight: 68 kg    General appearance: Awake alert.  In no distress Resp: Clear to auscultation bilaterally.  Normal effort Cardio: S1-S2 is normal regular.  No S3-S4.  No rubs murmurs or bruit GI: Abdomen is soft.  Tender in the epigastric area without any rebound  rigidity or guarding. Extremities: No edema.  Full range of motion of lower extremities. Neurologic: Alert and oriented x3.  No focal neurological deficits.     Lab Results:  Data Reviewed: I have personally reviewed following labs and reports of the imaging studies  CBC: Recent Labs  Lab 10/28/23 1846 10/31/23 1942 11/01/23 0343 11/02/23 0431 11/03/23 0500  WBC 7.2 10.6* 11.9* 4.4 3.1*  NEUTROABS 5.2 8.6*  --   --   --   HGB 13.3 12.3* 13.7 10.5* 11.3*  HCT 39.0 38.6* 42.0 31.9* 34.2*  MCV 94.0 96.3 95.5 94.4 95.0  PLT 381 307 372 175 151    Basic Metabolic Panel: Recent Labs  Lab 10/28/23 1846 10/31/23 1942 11/01/23 0343 11/02/23 0431 11/03/23 0500 11/04/23 0509  NA 137 143 141 137 137 137  K 3.2* 4.0 4.1 4.0 3.7 4.2  CL 104 101 104 106 100 101  CO2 24 16* 21* 24 31 29   GLUCOSE 124* 138* 126* 94 86 116*  BUN <5* 10 10 5* <5* 6  CREATININE 0.67 0.86 1.01 0.76 0.71 0.97  CALCIUM 9.2 9.1 7.8* 8.0* 8.9 9.0  MG 2.2  --  1.6* 2.1  --  1.6*  PHOS  --   --   --  2.5  --   --     GFR: Estimated Creatinine Clearance: 100.3 mL/min (by C-G formula based on SCr of 0.97 mg/dL).  Liver Function Tests: Recent Labs  Lab 10/31/23 1942 11/01/23 0343 11/02/23 0431 11/03/23 0500 11/04/23 0509  AST 49* 33 28 28 23   ALT 30 23 18 21 19   ALKPHOS 94 73 52 55 53  BILITOT 0.9 0.7 1.1 0.6 0.7  PROT 8.3* 6.6 5.1* 5.8* 5.9*  ALBUMIN 4.0 3.2* 2.6* 2.8* 2.7*    Recent Labs  Lab 10/28/23 1846 10/31/23 1942 11/02/23 0431 11/03/23 0500 11/04/23 0509  LIPASE 97* 431* 128* 115* 68*    CBG: Recent Labs  Lab 11/03/23 0829 11/03/23 1222 11/03/23 1526 11/03/23 2144 11/04/23 0809  GLUCAP 88 102* 82 114* 96     Radiology Studies: No results found.      LOS: 4 days   Gary [name] [employer] on [email or web address]  11/04/2023, 9:25 AM

## 2023-11-05 DIAGNOSIS — F10929 Alcohol use, unspecified with intoxication, unspecified: Secondary | ICD-10-CM | POA: Diagnosis not present

## 2023-11-05 DIAGNOSIS — K860 Alcohol-induced chronic pancreatitis: Secondary | ICD-10-CM | POA: Diagnosis not present

## 2023-11-05 LAB — GLUCOSE, CAPILLARY
Glucose-Capillary: 102 mg/dL — ABNORMAL HIGH (ref 70–99)
Glucose-Capillary: 112 mg/dL — ABNORMAL HIGH (ref 70–99)
Glucose-Capillary: 109 mg/dL — ABNORMAL HIGH (ref 70–99)
Glucose-Capillary: 109 mg/dL — ABNORMAL HIGH (ref 70–99)

## 2023-11-05 LAB — BASIC METABOLIC PANEL
Anion gap: 8 (ref 5–15)
BUN: 10 mg/dL (ref 6–20)
CO2: 26 mmol/L (ref 22–32)
Calcium: 9.2 mg/dL (ref 8.9–10.3)
Chloride: 99 mmol/L (ref 98–111)
Creatinine, Ser: 0.81 mg/dL (ref 0.61–1.24)
GFR, Estimated: >60 mL/min (ref >60)
Glucose, Bld: 108 mg/dL — ABNORMAL HIGH (ref 70–99)
Potassium: 4 mmol/L (ref 3.5–5.1)
Sodium: 133 mmol/L — ABNORMAL LOW (ref 135–145)

## 2023-11-05 LAB — MAGNESIUM: Magnesium: 1.6 mg/dL — ABNORMAL LOW (ref 1.7–2.4)

## 2023-11-05 MED ORDER — LORAZEPAM 2 MG/ML IJ SOLN
1.0000 mg | INTRAMUSCULAR | Status: DC | PRN
  Administered 2023-11-05: 2 mg via INTRAVENOUS
  Filled 2023-11-05: qty 1

## 2023-11-05 MED ORDER — LORAZEPAM 1 MG PO TABS
1.0000 mg | ORAL_TABLET | ORAL | Status: DC | PRN
  Administered 2023-11-05: 2 mg via ORAL
  Administered 2023-11-06: 1 mg via ORAL
  Administered 2023-11-06: 2 mg via ORAL
  Filled 2023-11-05: qty 2
  Filled 2023-11-05: qty 1
  Filled 2023-11-05: qty 2

## 2023-11-05 MED ORDER — POLYETHYLENE GLYCOL 3350 17 G PO PACK
17.0000 g | PACK | Freq: Two times a day (BID) | ORAL | Status: AC
  Administered 2023-11-05 – 2023-11-06 (×3): 17 g via ORAL
  Filled 2023-11-05 (×3): qty 1

## 2023-11-05 MED ORDER — BISACODYL 5 MG PO TBEC
10.0000 mg | DELAYED_RELEASE_TABLET | Freq: Once | ORAL | Status: AC
  Administered 2023-11-05: 10 mg via ORAL
  Filled 2023-11-05: qty 2

## 2023-11-05 MED ORDER — MAGNESIUM SULFATE 2 GM/50ML IV SOLN
2.0000 g | Freq: Once | INTRAVENOUS | Status: AC
  Administered 2023-11-05: 2 g via INTRAVENOUS
  Filled 2023-11-05: qty 50

## 2023-11-05 NOTE — Progress Notes (Signed)
[facility] PROGRESS NOTE   Gary [name] OZH:[id number] DOB: September 15, 1986 DOA: 10/31/2023  PCP: Default, Provider, MD  Brief History: 37 y.o. male with medical history significant for alcohol abuse, recurrent pancreatitis, HIV, seizure disorder noncompliance with his keppra, mesenteric thrombus off OAC, multiple admissions for alcohol abuse and recurrent pancreatitis, drug seeking behavior, with recent hospitalization 12/15-12/19/2024 where he was treated for recurrent acute alcoholic pancreatitis but signed out AMA.  Patient presented with nausea vomiting and abdominal pain.  Was found to have elevated lipase level.  He was hospitalized for further management.    Consultants: None  Procedures: None    Subjective/Interval History:  Reports abdominal pain has improved, but that has improved as well, asking for his withdrawal protocol to be increased.  Assessment/Plan:  Acute on chronic alcoholic pancreatitis Lipase level was elevated at 431.  Had a recent CT scan 2 days prior to this admission on 12/19 which showed slightly improved severe pancreatitis compared to 12/7.   Lipase levels have improved.  Continues to have significant pain.  Continue with current pain medications without any escalation.  Continue with soft diet.  Mobilize.  Alcohol abuse Continues to drink heavily. Continue with CIWA protocol Alcohol level on admission was 87.  Thiamine folic acid multivitamins. Patient requesting rehabilitation.  Will consult TOC to assist.   LFTs are normal.  Normocytic anemia Hemoglobin is stable.  No evidence for overt bleeding.  Hypotension Has chronically low blood pressures.  He is asymptomatic.  Continue to monitor.  Seizure disorder Probably related to alcohol abuse.  Not very compliant with his Keppra.  Continue Keppra for now.  Hypomagnesemia  -replaced, monitor closely  History of HIV Last CD4 count was 5 and 66.  Noted to be on Biktarvy.   DVT  Prophylaxis: Subcutaneous heparin Code Status: Full code Family Communication: Discussed with patient Disposition Plan: Start mobilizing.       Medications: Scheduled:  bictegravir-emtricitabine-tenofovir AF  1 tablet Oral Daily   docusate sodium  100 mg Oral BID   folic acid  1 mg Oral Daily   heparin  5,000 Units Subcutaneous Q8H   levETIRAcetam  500 mg Oral BID   multivitamin with minerals  1 tablet Oral Daily   ondansetron  4 mg Intravenous Once   pantoprazole  40 mg Oral Daily   thiamine  100 mg Oral Daily   Or   thiamine  100 mg Intravenous Daily   Continuous:    GEX:BMWUXLKGMWNUU **OR** acetaminophen, albuterol, HYDROmorphone (DILAUDID) injection, LORazepam **OR** LORazepam, LORazepam, ondansetron (ZOFRAN) IV, oxyCODONE, promethazine  Antibiotics: Anti-infectives (From admission, onward)    Start     Dose/Rate Route Frequency Ordered Stop   11/01/23 0800  bictegravir-emtricitabine-tenofovir AF (BIKTARVY) 50-200-25 MG per tablet 1 tablet        1 tablet Oral Daily 11/01/23 0451         Objective:  Vital Signs  Vitals:   11/05/23 0515 11/05/23 0809 11/05/23 1107 11/05/23 1215  BP: 110/79 96/79 107/73 111/83  Pulse: 82 74  71  Resp: 16 18  10   Temp: (!) 97.4 F (36.3 C) 98.1 F (36.7 C)  98.3 F (36.8 C)  TempSrc: Oral Oral  Oral  SpO2:  94%  94%  Weight: 59.3 kg     Height:        Intake/Output Summary (Last 24 hours) at 11/05/2023 1327 Last data filed at 11/05/2023 0600 Gross per 24 hour  Intake --  Output 2110 ml  Net -2110 ml  Filed Weights   10/31/23 2020 11/05/23 0515  Weight: 68 kg 59.3 kg    Physical exam Awake Alert, Oriented X 3, frail, appears older than stated age, Symmetrical Chest wall movement, Good air movement bilaterally, CTAB RRR,No Gallops,Rubs or new Murmurs, No Parasternal Heave +ve B.Sounds, Abd Soft, No rebound - guarding or rigidity. No Cyanosis, Clubbing or edema, No new Rash or bruise     Lab Results:  Data  Reviewed: I have personally reviewed following labs and reports of the imaging studies  CBC: Recent Labs  Lab 10/31/23 1942 11/01/23 0343 11/02/23 0431 11/03/23 0500  WBC 10.6* 11.9* 4.4 3.1*  NEUTROABS 8.6*  --   --   --   HGB 12.3* 13.7 10.5* 11.3*  HCT 38.6* 42.0 31.9* 34.2*  MCV 96.3 95.5 94.4 95.0  PLT 307 372 175 151    Basic Metabolic Panel: Recent Labs  Lab 11/01/23 0343 11/02/23 0431 11/03/23 0500 11/04/23 0509 11/05/23 0539  NA 141 137 137 137 133*  K 4.1 4.0 3.7 4.2 4.0  CL 104 106 100 101 99  CO2 21* 24 31 29 26   GLUCOSE 126* 94 86 116* 108*  BUN 10 5* <5* 6 10  CREATININE 1.01 0.76 0.71 0.97 0.81  CALCIUM 7.8* 8.0* 8.9 9.0 9.2  MG 1.6* 2.1  --  1.6* 1.6*  PHOS  --  2.5  --   --   --     GFR: Estimated Creatinine Clearance: 104.7 mL/min (by C-G formula based on SCr of 0.81 mg/dL).  Liver Function Tests: Recent Labs  Lab 10/31/23 1942 11/01/23 0343 11/02/23 0431 11/03/23 0500 11/04/23 0509  AST 49* 33 28 28 23   ALT 30 23 18 21 19   ALKPHOS 94 73 52 55 53  BILITOT 0.9 0.7 1.1 0.6 0.7  PROT 8.3* 6.6 5.1* 5.8* 5.9*  ALBUMIN 4.0 3.2* 2.6* 2.8* 2.7*    Recent Labs  Lab 10/31/23 1942 11/02/23 0431 11/03/23 0500 11/04/23 0509  LIPASE 431* 128* 115* 68*    CBG: Recent Labs  Lab 11/04/23 1212 11/04/23 1604 11/04/23 2130 11/05/23 0807 11/05/23 1214  GLUCAP 109* 107* 116* 102* 112*     Radiology Studies: No results found.      LOS: 5 days   [facility]  [facility] Pager on [email or web address]  11/05/2023, 1:27 PM

## 2023-11-05 NOTE — Progress Notes (Signed)
Patient is alert , awake. PT kept asking the nurse pain meds and IV Ativan despite having all PRN meds around clock.

## 2023-11-05 NOTE — Plan of Care (Signed)

## 2023-11-05 NOTE — TOC Progression Note (Signed)
Transition of Care [facility]) - Progression Note    Patient Details  Name: Gary [name] MRN: [id number] Date of Birth: 11-Jan-1986  Transition of Care [facility]) CM/SW Contact  Mearl Latin, LCSW Phone Number: 11/05/2023, 3:11 PM  Clinical Narrative:    3:11 PM-CSW spoke with [facility] in [place] and admissions stated that patient does not qualify for their detox program as he has been in the hospital for 5 days already and he does not qualify for their 60 day program since he has Medicaid. He suggested [facility]. CSW left voicemail for [facility], though patient has stated he did not want a 30 day program, only a two-week one.    Expected Discharge Plan: Homeless Shelter Barriers to Discharge: Continued Medical Work up  Expected Discharge Plan and Services In-house Referral: Clinical Social Work     Living arrangements for the past 2 months: Homeless                                       Social Determinants of Health (SDOH) Interventions SDOH Screenings   Food Insecurity: Food Insecurity Present (11/01/2023)  Housing: High Risk (11/01/2023)  Transportation Needs: Unmet Transportation Needs (11/01/2023)  Utilities: At Risk (11/01/2023)  Alcohol Screen: High Risk (07/25/2021)  Depression (PHQ2-9): Medium Risk (02/12/2021)  Financial Resource Strain: Patient Declined (10/11/2023)  Social Connections: Unknown (06/24/2022)   Received from [facility]  Stress: No Stress Concern Present (06/30/2022)   Received from [facility]  Recent Concern: Stress - Stress Concern Present (06/25/2022)   Received from [facility]  Tobacco Use: High Risk (11/01/2023)    Readmission Risk Interventions    11/04/2023    2:28 PM 10/25/2023    1:58 PM 10/11/2023   12:26 PM  Readmission Risk Prevention Plan  Transportation Screening Complete Complete Complete  Medication Review (RN Care Manager) Complete Complete Referral to Pharmacy  PCP or Specialist appointment  within 3-5 days of discharge Patient refused  Patient refused  HRI or Home Care Consult Complete Complete Patient refused  SW Recovery Care/Counseling Consult Complete Complete Complete  Palliative Care Screening Not Applicable Not Applicable Not Applicable  Skilled Nursing Facility Not Applicable Not Applicable Not Applicable

## 2023-11-05 NOTE — Plan of Care (Signed)
  Problem: Education: Goal: Knowledge of General Education information will improve Description: Including pain rating scale, medication(s)/side effects and non-pharmacologic comfort measures Outcome: Not Progressing   Problem: Health Behavior/Discharge Planning: Goal: Ability to manage health-related needs will improve Outcome: Not Progressing   Problem: Clinical Measurements: Goal: Ability to maintain clinical measurements within normal limits will improve Outcome: Not Progressing Goal: Will remain free from infection Outcome: Not Progressing Goal: Diagnostic test results will improve Outcome: Not Progressing Goal: Respiratory complications will improve Outcome: Not Progressing Goal: Cardiovascular complication will be avoided Outcome: Not Progressing   Problem: Activity: Goal: Risk for activity intolerance will decrease Outcome: Not Progressing   Problem: Nutrition: Goal: Adequate nutrition will be maintained Outcome: Not Progressing   Problem: Coping: Goal: Level of anxiety will decrease Outcome: Not Progressing   Problem: Elimination: Goal: Will not experience complications related to bowel motility Outcome: Not Progressing Goal: Will not experience complications related to urinary retention Outcome: Not Progressing   Problem: Pain Management: Goal: General experience of comfort will improve Outcome: Not Progressing   Problem: Safety: Goal: Ability to remain free from injury will improve Outcome: Not Progressing   Problem: Skin Integrity: Goal: Risk for impaired skin integrity will decrease Outcome: Not Progressing

## 2023-11-06 ENCOUNTER — Other Ambulatory Visit: Payer: Self-pay

## 2023-11-06 ENCOUNTER — Inpatient Hospital Stay (HOSPITAL_COMMUNITY)
Admission: EM | Admit: 2023-11-06 | Discharge: 2023-11-10 | DRG: 974 | Disposition: A | Discharge status: Home / Self Care | Payer: MEDICAID | Attending: Family Medicine | Admitting: Family Medicine

## 2023-11-06 ENCOUNTER — Other Ambulatory Visit (HOSPITAL_COMMUNITY): Payer: Self-pay

## 2023-11-06 ENCOUNTER — Emergency Department (HOSPITAL_COMMUNITY): Payer: MEDICAID

## 2023-11-06 ENCOUNTER — Encounter (HOSPITAL_COMMUNITY): Payer: Self-pay

## 2023-11-06 DIAGNOSIS — R053 Chronic cough: Secondary | ICD-10-CM | POA: Diagnosis present

## 2023-11-06 DIAGNOSIS — E872 Acidosis, unspecified: Secondary | ICD-10-CM | POA: Diagnosis present

## 2023-11-06 DIAGNOSIS — K852 Alcohol induced acute pancreatitis without necrosis or infection: Secondary | ICD-10-CM | POA: Diagnosis not present

## 2023-11-06 DIAGNOSIS — F209 Schizophrenia, unspecified: Secondary | ICD-10-CM | POA: Diagnosis present

## 2023-11-06 DIAGNOSIS — Z811 Family history of alcohol abuse and dependence: Secondary | ICD-10-CM

## 2023-11-06 DIAGNOSIS — K861 Other chronic pancreatitis: Secondary | ICD-10-CM | POA: Diagnosis present

## 2023-11-06 DIAGNOSIS — T375X6A Underdosing of antiviral drugs, initial encounter: Secondary | ICD-10-CM | POA: Diagnosis present

## 2023-11-06 DIAGNOSIS — Z21 Asymptomatic human immunodeficiency virus [HIV] infection status: Secondary | ICD-10-CM | POA: Diagnosis not present

## 2023-11-06 DIAGNOSIS — F4310 Post-traumatic stress disorder, unspecified: Secondary | ICD-10-CM | POA: Diagnosis present

## 2023-11-06 DIAGNOSIS — G40909 Epilepsy, unspecified, not intractable, without status epilepticus: Secondary | ICD-10-CM | POA: Diagnosis present

## 2023-11-06 DIAGNOSIS — F3181 Bipolar II disorder: Secondary | ICD-10-CM | POA: Diagnosis present

## 2023-11-06 DIAGNOSIS — F17210 Nicotine dependence, cigarettes, uncomplicated: Secondary | ICD-10-CM | POA: Diagnosis present

## 2023-11-06 DIAGNOSIS — Z888 Allergy status to other drugs, medicaments and biological substances status: Secondary | ICD-10-CM

## 2023-11-06 DIAGNOSIS — Z79899 Other long term (current) drug therapy: Secondary | ICD-10-CM

## 2023-11-06 DIAGNOSIS — D6959 Other secondary thrombocytopenia: Secondary | ICD-10-CM | POA: Diagnosis present

## 2023-11-06 DIAGNOSIS — Z91148 Patient's other noncompliance with medication regimen for other reason: Secondary | ICD-10-CM

## 2023-11-06 DIAGNOSIS — F1020 Alcohol dependence, uncomplicated: Secondary | ICD-10-CM | POA: Diagnosis present

## 2023-11-06 DIAGNOSIS — J189 Pneumonia, unspecified organism: Secondary | ICD-10-CM | POA: Diagnosis not present

## 2023-11-06 DIAGNOSIS — F10220 Alcohol dependence with intoxication, uncomplicated: Secondary | ICD-10-CM | POA: Diagnosis present

## 2023-11-06 DIAGNOSIS — Z5900 Homelessness unspecified: Secondary | ICD-10-CM

## 2023-11-06 DIAGNOSIS — I959 Hypotension, unspecified: Secondary | ICD-10-CM | POA: Diagnosis present

## 2023-11-06 DIAGNOSIS — J690 Pneumonitis due to inhalation of food and vomit: Principal | ICD-10-CM | POA: Diagnosis present

## 2023-11-06 DIAGNOSIS — E860 Dehydration: Secondary | ICD-10-CM | POA: Diagnosis present

## 2023-11-06 DIAGNOSIS — F419 Anxiety disorder, unspecified: Secondary | ICD-10-CM | POA: Diagnosis present

## 2023-11-06 DIAGNOSIS — K860 Alcohol-induced chronic pancreatitis: Secondary | ICD-10-CM | POA: Diagnosis present

## 2023-11-06 DIAGNOSIS — B20 Human immunodeficiency virus [HIV] disease: Secondary | ICD-10-CM | POA: Diagnosis present

## 2023-11-06 DIAGNOSIS — F10920 Alcohol use, unspecified with intoxication, uncomplicated: Secondary | ICD-10-CM

## 2023-11-06 DIAGNOSIS — Y906 Blood alcohol level of 120-199 mg/100 ml: Secondary | ICD-10-CM | POA: Diagnosis present

## 2023-11-06 LAB — COMPREHENSIVE METABOLIC PANEL
ALT: 19 U/L (ref 0–44)
AST: 23 U/L (ref 15–41)
Albumin: 3.6 g/dL (ref 3.5–5.0)
Alkaline Phosphatase: 59 U/L (ref 38–126)
Anion gap: 15 (ref 5–15)
BUN: 21 mg/dL — ABNORMAL HIGH (ref 6–20)
CO2: 21 mmol/L — ABNORMAL LOW (ref 22–32)
Calcium: 9.9 mg/dL (ref 8.9–10.3)
Chloride: 95 mmol/L — ABNORMAL LOW (ref 98–111)
Creatinine, Ser: 1.06 mg/dL (ref 0.61–1.24)
GFR, Estimated: >60 mL/min (ref >60)
Glucose, Bld: 146 mg/dL — ABNORMAL HIGH (ref 70–99)
Potassium: 3.6 mmol/L (ref 3.5–5.1)
Sodium: 131 mmol/L — ABNORMAL LOW (ref 135–145)
Total Bilirubin: 0.4 mg/dL (ref <1.2)
Total Protein: 7.6 g/dL (ref 6.5–8.1)

## 2023-11-06 LAB — URINALYSIS, ROUTINE W REFLEX MICROSCOPIC
Bilirubin Urine: NEGATIVE
Glucose, UA: NEGATIVE mg/dL
Ketones, ur: NEGATIVE mg/dL
Leukocytes,Ua: NEGATIVE
Nitrite: NEGATIVE
Protein, ur: NEGATIVE mg/dL
Specific Gravity, Urine: 1.008 (ref 1.005–1.030)
pH: 5 (ref 5.0–8.0)

## 2023-11-06 LAB — ETHANOL: Alcohol, Ethyl (B): 172 mg/dL — ABNORMAL HIGH (ref <10)

## 2023-11-06 LAB — RAPID URINE DRUG SCREEN, HOSP PERFORMED
Amphetamines: NOT DETECTED
Barbiturates: NOT DETECTED
Benzodiazepines: POSITIVE — AB
Cocaine: NOT DETECTED
Opiates: POSITIVE — AB
Tetrahydrocannabinol: NOT DETECTED

## 2023-11-06 LAB — CBC
HCT: 37.8 % — ABNORMAL LOW (ref 39.0–52.0)
Hemoglobin: 12.4 g/dL — ABNORMAL LOW (ref 13.0–17.0)
MCH: 30.5 pg (ref 26.0–34.0)
MCHC: 32.8 g/dL (ref 30.0–36.0)
MCV: 93.1 fL (ref 80.0–100.0)
Platelets: 204 10*3/uL (ref 150–400)
RBC: 4.06 MIL/uL — ABNORMAL LOW (ref 4.22–5.81)
RDW: 15.5 % (ref 11.5–15.5)
WBC: 6.9 10*3/uL (ref 4.0–10.5)
nRBC: 0 % (ref 0.0–0.2)

## 2023-11-06 LAB — GLUCOSE, CAPILLARY: Glucose-Capillary: 93 mg/dL (ref 70–99)

## 2023-11-06 LAB — I-STAT CG4 LACTIC ACID, ED: Lactic Acid, Venous: 2.9 mmol/L (ref 0.5–1.9)

## 2023-11-06 LAB — LIPASE, BLOOD: Lipase: 158 U/L — ABNORMAL HIGH (ref 11–51)

## 2023-11-06 LAB — PROCALCITONIN: Procalcitonin: <0.1 ng/mL

## 2023-11-06 MED ORDER — POLYETHYLENE GLYCOL 3350 17 G PO PACK
17.0000 g | PACK | Freq: Every day | ORAL | Status: DC | PRN
  Administered 2023-11-08: 17 g via ORAL
  Filled 2023-11-06: qty 1

## 2023-11-06 MED ORDER — SODIUM CHLORIDE 0.9% FLUSH
3.0000 mL | Freq: Two times a day (BID) | INTRAVENOUS | Status: DC
  Administered 2023-11-07 – 2023-11-10 (×7): 3 mL via INTRAVENOUS

## 2023-11-06 MED ORDER — LORAZEPAM 1 MG PO TABS
1.0000 mg | ORAL_TABLET | ORAL | Status: AC | PRN
Start: 2023-11-06 — End: 2023-11-09
  Administered 2023-11-07: 1 mg via ORAL
  Filled 2023-11-06: qty 1

## 2023-11-06 MED ORDER — PROCHLORPERAZINE EDISYLATE 10 MG/2ML IJ SOLN
5.0000 mg | INTRAMUSCULAR | Status: DC | PRN
  Administered 2023-11-07 – 2023-11-09 (×3): 5 mg via INTRAVENOUS
  Filled 2023-11-06 (×3): qty 2

## 2023-11-06 MED ORDER — ACETAMINOPHEN 650 MG RE SUPP: 650.0000 mg | Freq: Four times a day (QID) | RECTAL | Status: DC | PRN

## 2023-11-06 MED ORDER — ACETAMINOPHEN 325 MG PO TABS: 650.0000 mg | ORAL_TABLET | Freq: Four times a day (QID) | ORAL | Status: DC | PRN

## 2023-11-06 MED ORDER — LORAZEPAM 1 MG PO TABS
0.0000 mg | ORAL_TABLET | Freq: Four times a day (QID) | ORAL | Status: DC
  Filled 2023-11-06: qty 1

## 2023-11-06 MED ORDER — PANTOPRAZOLE SODIUM 40 MG PO TBEC
40.0000 mg | DELAYED_RELEASE_TABLET | Freq: Every day | ORAL | Status: DC
  Administered 2023-11-07 – 2023-11-10 (×4): 40 mg via ORAL
  Filled 2023-11-06 (×4): qty 1

## 2023-11-06 MED ORDER — BICTEGRAVIR-EMTRICITAB-TENOFOV 50-200-25 MG PO TABS
1.0000 | ORAL_TABLET | Freq: Every day | ORAL | Status: DC
  Administered 2023-11-07 – 2023-11-10 (×4): 1 via ORAL
  Filled 2023-11-06 (×5): qty 1

## 2023-11-06 MED ORDER — SODIUM CHLORIDE 0.9 % IV BOLUS
1000.0000 mL | Freq: Once | INTRAVENOUS | Status: AC
  Administered 2023-11-06: 1000 mL via INTRAVENOUS

## 2023-11-06 MED ORDER — ADULT MULTIVITAMIN W/MINERALS CH
1.0000 | ORAL_TABLET | Freq: Every day | ORAL | Status: DC
  Administered 2023-11-06 – 2023-11-10 (×5): 1 via ORAL
  Filled 2023-11-06 (×5): qty 1

## 2023-11-06 MED ORDER — LORAZEPAM 1 MG PO TABS: 1.0000 mg | ORAL_TABLET | ORAL | Status: DC | PRN

## 2023-11-06 MED ORDER — LORAZEPAM 2 MG/ML IJ SOLN: 0.0000 mg | Freq: Two times a day (BID) | INTRAMUSCULAR | Status: DC

## 2023-11-06 MED ORDER — PANCRELIPASE (LIP-PROT-AMYL) 36000-114000 UNITS PO CPEP
36000.0000 [IU] | ORAL_CAPSULE | Freq: Three times a day (TID) | ORAL | Status: DC
  Administered 2023-11-07 – 2023-11-10 (×11): 36000 [IU] via ORAL
  Filled 2023-11-06 (×13): qty 1

## 2023-11-06 MED ORDER — LORAZEPAM 2 MG/ML IJ SOLN
1.0000 mg | INTRAMUSCULAR | Status: AC | PRN
  Administered 2023-11-09: 2 mg via INTRAVENOUS
  Filled 2023-11-06: qty 1

## 2023-11-06 MED ORDER — THIAMINE HCL 100 MG/ML IJ SOLN
100.0000 mg | Freq: Every day | INTRAMUSCULAR | Status: DC
  Administered 2023-11-07: 100 mg via INTRAVENOUS
  Filled 2023-11-06: qty 2

## 2023-11-06 MED ORDER — LORAZEPAM 2 MG/ML IJ SOLN
0.0000 mg | Freq: Four times a day (QID) | INTRAMUSCULAR | Status: AC
  Administered 2023-11-06 – 2023-11-07 (×2): 2 mg via INTRAVENOUS
  Administered 2023-11-07: 1 mg via INTRAVENOUS
  Administered 2023-11-07: 2 mg via INTRAVENOUS
  Administered 2023-11-08 (×4): 1 mg via INTRAVENOUS
  Filled 2023-11-06 (×8): qty 1

## 2023-11-06 MED ORDER — SODIUM CHLORIDE 0.9 % IV SOLN
3.0000 g | Freq: Once | INTRAVENOUS | Status: AC
  Administered 2023-11-06: 3 g via INTRAVENOUS
  Filled 2023-11-06: qty 8

## 2023-11-06 MED ORDER — SUCRALFATE 1 G PO TABS
1.0000 g | ORAL_TABLET | Freq: Three times a day (TID) | ORAL | Status: DC
  Administered 2023-11-07 – 2023-11-10 (×14): 1 g via ORAL
  Filled 2023-11-06 (×14): qty 1

## 2023-11-06 MED ORDER — THIAMINE MONONITRATE 100 MG PO TABS
100.0000 mg | ORAL_TABLET | Freq: Every day | ORAL | Status: DC
Start: 2023-11-06 — End: 2023-11-10
  Administered 2023-11-06 – 2023-11-10 (×4): 100 mg via ORAL
  Filled 2023-11-06 (×4): qty 1

## 2023-11-06 MED ORDER — FOLIC ACID 1 MG PO TABS
1.0000 mg | ORAL_TABLET | Freq: Every day | ORAL | Status: DC
  Administered 2023-11-06 – 2023-11-10 (×5): 1 mg via ORAL
  Filled 2023-11-06 (×5): qty 1

## 2023-11-06 MED ORDER — LORAZEPAM 1 MG PO TABS: 0.0000 mg | ORAL_TABLET | Freq: Two times a day (BID) | ORAL | Status: DC

## 2023-11-06 MED ORDER — FENTANYL CITRATE PF 50 MCG/ML IJ SOSY
25.0000 ug | PREFILLED_SYRINGE | Freq: Once | INTRAMUSCULAR | Status: AC
  Administered 2023-11-06: 25 ug via INTRAVENOUS
  Filled 2023-11-06: qty 1

## 2023-11-06 MED ORDER — SODIUM CHLORIDE 0.9 % IV SOLN
500.0000 mg | INTRAVENOUS | Status: DC
  Administered 2023-11-06 – 2023-11-09 (×4): 500 mg via INTRAVENOUS
  Filled 2023-11-06 (×5): qty 5

## 2023-11-06 MED ORDER — METHOCARBAMOL 1000 MG/10ML IJ SOLN: 500.0000 mg | Freq: Four times a day (QID) | INTRAMUSCULAR | Status: DC | PRN

## 2023-11-06 MED ORDER — LEVETIRACETAM 500 MG PO TABS
500.0000 mg | ORAL_TABLET | Freq: Two times a day (BID) | ORAL | Status: DC
  Administered 2023-11-06 – 2023-11-10 (×8): 500 mg via ORAL
  Filled 2023-11-06 (×8): qty 1

## 2023-11-06 MED ORDER — LEVETIRACETAM 500 MG PO TABS
500.0000 mg | ORAL_TABLET | Freq: Two times a day (BID) | ORAL | 0 refills | Status: DC
  Filled 2023-11-06 – 2023-11-08 (×2): qty 60, 30d supply, fill #0

## 2023-11-06 MED ORDER — ENOXAPARIN SODIUM 40 MG/0.4ML IJ SOSY
40.0000 mg | PREFILLED_SYRINGE | INTRAMUSCULAR | Status: DC
  Administered 2023-11-06 – 2023-11-09 (×4): 40 mg via SUBCUTANEOUS
  Filled 2023-11-06 (×4): qty 0.4

## 2023-11-06 MED ORDER — SODIUM CHLORIDE 0.9 % IV SOLN
2.0000 g | INTRAVENOUS | Status: DC
  Administered 2023-11-07 – 2023-11-10 (×4): 2 g via INTRAVENOUS
  Filled 2023-11-06 (×4): qty 20

## 2023-11-06 MED ORDER — LORAZEPAM 2 MG/ML IJ SOLN: 1.0000 mg | INTRAMUSCULAR | Status: DC | PRN

## 2023-11-06 MED ORDER — KETOROLAC TROMETHAMINE 15 MG/ML IJ SOLN
15.0000 mg | Freq: Four times a day (QID) | INTRAMUSCULAR | Status: DC | PRN
  Administered 2023-11-07 – 2023-11-10 (×8): 15 mg via INTRAVENOUS
  Filled 2023-11-06 (×8): qty 1

## 2023-11-06 MED ORDER — LEVETIRACETAM 500 MG PO TABS: 500.0000 mg | ORAL_TABLET | Freq: Two times a day (BID) | ORAL | 0 refills | Status: DC

## 2023-11-06 MED ORDER — OXYCODONE HCL 5 MG PO TABS
5.0000 mg | ORAL_TABLET | ORAL | Status: DC | PRN
  Administered 2023-11-07 – 2023-11-08 (×6): 5 mg via ORAL
  Filled 2023-11-06 (×6): qty 1

## 2023-11-06 NOTE — ED Notes (Signed)
Pt's belongings are being stored in locker #4.

## 2023-11-06 NOTE — H&P (Signed)
History and Physical    Gary [name] WUJ:[id number] DOB: [birth year] DOA: 11/06/2023  PCP: Default, Provider, MD   Patient coming from: Home   Chief Complaint: Aches, malaise   HPI: Gary [name] is a 37 y.o. male with medical history significant for alcohol abuse, chronic pancreatitis, HIV, seizure disorder, and nonadherence with treatment plan who presents to the emergency department with aches and malaise.   Patient had just been discharged earlier today after admission for acute on chronic alcoholic pancreatitis.  Nausea and pain had improved, he had been tolerating a soft diet, but reports worsening malaise and generalized aches and pains since the discharge.  He consumed some alcohol but continued to feel poorly.  He reports that his chronic cough has recently been productive of thick sputum.  ED Course: Upon arrival to the ED, patient is found to be afebrile and saturating mid 90s on room air with SBP in the 90s and higher.  Labs are most notable for sodium 131, normal WBC, lactic acid 2.9, ethanol 172, and lipase 158.  Chest x-ray is concerning for pneumonia at the left base.  Patient was treated with 2 L of NS, fentanyl, and Unasyn in the ED.  Review of Systems:  All other systems reviewed and apart from HPI, are negative.  Past Medical History:  Diagnosis Date   Alcohol abuse    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   Anxiety    Bipolar 2 disorder (HCC)    HIV (human immunodeficiency virus infection) (HCC)    Pancreatitis    PTSD (post-traumatic stress disorder)    Schizophrenia (HCC)    Seizures (HCC)    Subdural hematoma (HCC)     Past Surgical History:  Procedure Laterality Date   BIOPSY  04/19/2022   Procedure: BIOPSY;  Surgeon: Lemar Lofty., MD;  Location: [facility] ENDOSCOPY;  Service: Gastroenterology;;   ENTEROSCOPY N/A 04/19/2022   Procedure: ENTEROSCOPY;  Surgeon: Lemar Lofty., MD;  Location: [facility] ENDOSCOPY;  Service: Gastroenterology;   Laterality: N/A;   INCISION AND DRAINAGE PERIRECTAL ABSCESS N/A 09/24/2016   Procedure: IRRIGATION AND DEBRIDEMENT PERIRECTAL ABSCESS;  Surgeon: Ricarda Frame, MD;  Location: [facility] ORS;  Service: General;  Laterality: N/A;   none      Social History:   reports that he has been smoking cigarettes. He started smoking about 21 years ago. He has a 10.5 pack-year smoking history. He has never used smokeless tobacco. He reports current alcohol use of about 105.0 standard drinks of alcohol per week. He reports current drug use. Drugs: Methamphetamines and Cocaine.  Allergies  Allergen Reactions   Tegretol [Carbamazepine] Other (See Comments)    Vertigo   Caffeine Palpitations    Family History  Problem Relation Age of Onset   Alcohol abuse Mother    Alcohol abuse Father    Colon cancer Other    Other Other    Cancer Other      Prior to Admission medications   Medication Sig Start Date End Date Taking? Authorizing Provider  bictegravir-emtricitabine-tenofovir AF (BIKTARVY) 50-200-25 MG TABS tablet Take 1 tablet by mouth daily.    [provider]  levETIRAcetam (KEPPRA) 500 MG tablet Take 1 tablet (500 mg total) by mouth 2 (two) times daily. 11/06/23 12/06/23  Elgergawy, Leana Roe, MD  lipase/protease/amylase (CREON) 36000 UNITS CPEP capsule Take 1 capsule (36,000 Units total) by mouth 3 (three) times daily before meals. Patient not taking: Reported on 11/01/2023 10/20/23   Dorcas Carrow, MD  pantoprazole (PROTONIX) 40 MG tablet  Take 1 tablet (40 mg total) by mouth daily. 10/20/23   Dorcas Carrow, MD  sucralfate (CARAFATE) 1 g tablet Take 1 tablet (1 g total) by mouth 4 (four) times daily -  with meals and at bedtime. Patient not taking: Reported on 11/01/2023 10/29/23   Horton, Mayer Masker, MD  thiamine (VITAMIN B1) 100 MG tablet Take 100 mg by mouth daily.    [provider]  famotidine (PEPCID) 20 MG tablet Take 1 tablet (20 mg total) by mouth 2 (two) times  daily. Patient not taking: Reported on 06/01/2019 05/14/19 06/02/19  Benjiman Core, MD    Physical Exam: Vitals:   11/06/23 1753 11/06/23 2031 11/06/23 2122  BP: 97/73 103/72 106/78  Pulse: (!) 101 94 92  Resp: 19 12 16   Temp: 98.2 F (36.8 C) 99.1 F (37.3 C)   TempSrc: Oral Oral   SpO2: 97% 97% 100%    Constitutional: NAD, cachectic Eyes: PERTLA, lids and conjunctivae normal ENMT: Mucous membranes are dry. Posterior pharynx clear of any exudate or lesions.   Neck: supple, no masses  Respiratory: no wheezing, no crackles. No accessory muscle use.  Cardiovascular: S1 & S2 heard, regular rate and rhythm. No extremity edema.   Abdomen: No distension, soft. Bowel sounds active.  Musculoskeletal: no clubbing / cyanosis. No joint deformity upper and lower extremities.   Skin: no significant rashes, lesions, ulcers. Warm, dry, well-perfused. Neurologic: CN 2-12 grossly intact. Moving all extremities. Sleeping; wakes to voice and is oriented.  Psychiatric: Calm. Cooperative.    Labs and Imaging on Admission: I have personally reviewed following labs and imaging studies  CBC: Recent Labs  Lab 10/31/23 1942 11/01/23 0343 11/02/23 0431 11/03/23 0500 11/06/23 1754  WBC 10.6* 11.9* 4.4 3.1* 6.9  NEUTROABS 8.6*  --   --   --   --   HGB 12.3* 13.7 10.5* 11.3* 12.4*  HCT 38.6* 42.0 31.9* 34.2* 37.8*  MCV 96.3 95.5 94.4 95.0 93.1  PLT 307 372 175 151 204   Basic Metabolic Panel: Recent Labs  Lab 11/01/23 0343 11/02/23 0431 11/03/23 0500 11/04/23 0509 11/05/23 0539 11/06/23 1754  NA 141 137 137 137 133* 131*  K 4.1 4.0 3.7 4.2 4.0 3.6  CL 104 106 100 101 99 95*  CO2 21* 24 31 29 26  21*  GLUCOSE 126* 94 86 116* 108* 146*  BUN 10 5* <5* 6 10 21*  CREATININE 1.01 0.76 0.71 0.97 0.81 1.06  CALCIUM 7.8* 8.0* 8.9 9.0 9.2 9.9  MG 1.6* 2.1  --  1.6* 1.6*  --   PHOS  --  2.5  --   --   --   --    GFR: Estimated Creatinine Clearance: 80 mL/min (by C-G formula based on SCr of  1.06 mg/dL). Liver Function Tests: Recent Labs  Lab 11/01/23 0343 11/02/23 0431 11/03/23 0500 11/04/23 0509 11/06/23 1754  AST 33 28 28 23 23   ALT 23 18 21 19 19   ALKPHOS 73 52 55 53 59  BILITOT 0.7 1.1 0.6 0.7 0.4  PROT 6.6 5.1* 5.8* 5.9* 7.6  ALBUMIN 3.2* 2.6* 2.8* 2.7* 3.6   Recent Labs  Lab 10/31/23 1942 11/02/23 0431 11/03/23 0500 11/04/23 0509 11/06/23 1754  LIPASE 431* 128* 115* 68* 158*   No results for input(s): "AMMONIA" in the last 168 hours. Coagulation Profile: No results for input(s): "INR", "PROTIME" in the last 168 hours. Cardiac Enzymes: No results for input(s): "CKTOTAL", "CKMB", "CKMBINDEX", "TROPONINI" in the last 168 hours. BNP (last  3 results) No results for input(s): "PROBNP" in the last 8760 hours. HbA1C: No results for input(s): "HGBA1C" in the last 72 hours. CBG: Recent Labs  Lab 11/05/23 0807 11/05/23 1214 11/05/23 1550 11/05/23 2122 11/06/23 0806  GLUCAP 102* 112* 109* 109* 93   Lipid Profile: No results for input(s): "CHOL", "HDL", "LDLCALC", "TRIG", "CHOLHDL", "LDLDIRECT" in the last 72 hours. Thyroid Function Tests: No results for input(s): "TSH", "T4TOTAL", "FREET4", "T3FREE", "THYROIDAB" in the last 72 hours. Anemia Panel: No results for input(s): "VITAMINB12", "FOLATE", "FERRITIN", "TIBC", "IRON", "RETICCTPCT" in the last 72 hours. Urine analysis:    Component Value Date/Time   COLORURINE YELLOW 11/06/2023 2039   APPEARANCEUR CLEAR 11/06/2023 2039   APPEARANCEUR Clear 11/15/2014 1755   LABSPEC 1.008 11/06/2023 2039   LABSPEC 1.002 11/15/2014 1755   PHURINE 5.0 11/06/2023 2039   GLUCOSEU NEGATIVE 11/06/2023 2039   GLUCOSEU Negative 11/15/2014 1755   HGBUR MODERATE (A) 11/06/2023 2039   BILIRUBINUR NEGATIVE 11/06/2023 2039   BILIRUBINUR Negative 11/15/2014 1755   KETONESUR NEGATIVE 11/06/2023 2039   PROTEINUR NEGATIVE 11/06/2023 2039   NITRITE NEGATIVE 11/06/2023 2039   LEUKOCYTESUR NEGATIVE 11/06/2023 2039    LEUKOCYTESUR Negative 11/15/2014 1755   Sepsis Labs: @LABRCNTIP (procalcitonin:4,lacticidven:4) ) Recent Results (from the past 240 hours)  Urine Culture     Status: Abnormal   Collection Time: 10/31/23  4:40 AM   Specimen: Urine, Random  Result Value Ref Range Status   Specimen Description URINE, RANDOM  Final   Special Requests   Final    NONE Reflexed from [id number] Performed at [facility], [address], [place], [place] [address]    Culture MULTIPLE SPECIES PRESENT, SUGGEST RECOLLECTION (A)  Final   Report Status 11/02/2023 FINAL  Final     Radiological Exams on Admission: DG Chest 2 View Result Date: 11/06/2023 CLINICAL DATA:  Hypotension. EXAM: CHEST - 2 VIEW COMPARISON:  Radiograph 8 days ago 11/01/2023 FINDINGS: Significant patient rotation. Chronic elevation of left hemidiaphragm. There is increasing patchy opacity at the left lung base. The right lung is clear. Grossly stable heart size and mediastinal contours allowing for rotation on the current exam. No pneumothorax or large pleural effusion. Normal pulmonary vasculature. IMPRESSION: 1. Increasing patchy opacity at the left lung base, suspicious for pneumonia. 2. Chronic elevation of left hemidiaphragm. Electronically Signed   By: Narda Rutherford M.D.   On: 11/06/2023 20:57    Assessment/Plan   1. Pneumonia  - Check strep pneumo and legionella antigens, treat with Rocephin and azithromycin    2. Alcohol abuse  - Monitor with CIWA, use Ativan as needed, supplement vitamins    3. Chronic pancreatitis  - Continue Creon    4. HIV  - VL was 4520 and CD4 566 in December 2024  - Continue Biktarvy   5. Seizure disorder  - Continue Keppra    DVT prophylaxis: Lovenox  Code Status: Full  Level of Care: Level of care: Med-Surg Family Communication: none present  Disposition Plan:  Patient is from: home Anticipated d/c is to: home  Anticipated d/c date is: 12/29 or 11/08/23  Patient currently: Pending stable  respiratory status  Consults called: None  Admission status: Observation     Briscoe Deutscher, MD [facility]  11/06/2023, 9:24 PM

## 2023-11-06 NOTE — Discharge Summary (Signed)
Physician Discharge Summary  STONE WATRING VHQ:[id number] DOB: [birth year] DOA: 10/31/2023  PCP: Default, Provider, MD  Admit date: 10/31/2023 Discharge date: 11/06/2023  Admitted From: (Homeless) Disposition:  (Same)  Recommendations for Outpatient Follow-up:  Continue counseling about alcohol abstinence   Patient is very high risk for readmission, this is his 10th admission over last 74-month, he is high risk of readmission due to his homelessness and alcoholism, he would likely come back to the hospital soon, he has narcotic seeking behavior.   Diet recommendation: Low-fat diet  Brief/Interim Summary:  37 y.o. male with medical history significant for alcohol abuse, recurrent pancreatitis, HIV, seizure disorder noncompliance with his keppra, mesenteric thrombus off OAC, multiple admissions for alcohol abuse and recurrent pancreatitis, drug seeking behavior, with recent hospitalization 12/15-12/19/2024 where he was treated for recurrent acute alcoholic pancreatitis but signed out AMA.  Patient presented with nausea vomiting and abdominal pain.  Was found to have elevated lipase level.  He was hospitalized for further management.      Acute on chronic alcoholic pancreatitis Lipase level was elevated at 431.  Had a recent CT scan 2 days prior to this admission on 12/19 which showed slightly improved severe pancreatitis compared to 12/7.   Lipase levels have improved.  He is tolerating soft diet for last 3 days, he was counseled at length about alcohol abstinence  He is at high risk for recurrent admission and acute on chronic alcoholic pancreatitis due to his alcoholism  Metabolic acidosis -Resolved with IV fluids  Alcohol abuse On CIWA protocol and thiamine during hospital stay LFTs are normal.   Normocytic anemia Hemoglobin is stable.  No evidence for overt bleeding.   Hypotension Has chronically low blood pressures.  He is asymptomatic.    Seizure disorder Probably  related to alcohol abuse.  Not very compliant with his Keppra.  Continue Keppra for now.  Bided at time of discharge   Hypomagnesemia  -replaced, monitor closely   History of HIV Last CD4 count was 5 and 66.  Noted to be on Biktarvy.   Discharge Diagnoses:  Active Problems:   Metabolic acidosis with increased anion gap and accumulation of organic acids   Acute alcoholic pancreatitis   Alcohol dependence with withdrawal (HCC)   HIV (human immunodeficiency virus infection) (HCC)   Alcohol abuse   Seizure disorder (HCC)   Hypomagnesemia   Alcoholic fatty liver   Acute on chronic pancreatitis (HCC)   Acute pancreatitis   Nausea and vomiting    Discharge Instructions  Discharge Instructions     Diet - low sodium heart healthy   Complete by: As directed    Discharge instructions   Complete by: As directed    NO alcohol consumption Low-fat/heart healthy diet   Increase activity slowly   Complete by: As directed       Allergies as of 11/06/2023       Reactions   Tegretol [carbamazepine] Other (See Comments)   Vertigo   Caffeine Palpitations        Medication List     TAKE these medications    bictegravir-emtricitabine-tenofovir AF 50-200-25 MG Tabs tablet Commonly known as: BIKTARVY Take 1 tablet by mouth daily.   Creon 36000-114000 units Cpep capsule Generic drug: lipase/protease/amylase Take 1 capsule (36,000 Units total) by mouth 3 (three) times daily before meals.   levETIRAcetam 500 MG tablet Commonly known as: KEPPRA Take 1 tablet (500 mg total) by mouth 2 (two) times daily.   pantoprazole 40 MG tablet Commonly known  as: Protonix Take 1 tablet (40 mg total) by mouth daily.   sucralfate 1 g tablet Commonly known as: Carafate Take 1 tablet (1 g total) by mouth 4 (four) times daily -  with meals and at bedtime.   thiamine 100 MG tablet Commonly known as: VITAMIN B1 Take 100 mg by mouth daily.        Allergies  Allergen Reactions    Tegretol [Carbamazepine] Other (See Comments)    Vertigo   Caffeine Palpitations    Consultations: None   Procedures/Studies: Acute Abdominal Series Result Date: 11/01/2023 CLINICAL DATA:  Nausea EXAM: DG ABDOMEN ACUTE WITH 1 VIEW CHEST COMPARISON:  09/30/2023 FINDINGS: Elevated left hemidiaphragm. Significant gaseous distention of the stomach. No confluent airspace opacities or effusions. Heart mediastinal contours within normal limits. No acute bony abnormality. IMPRESSION: No acute cardiopulmonary disease. Elevation of the left hemidiaphragm with significant gaseous distention of the stomach. Electronically Signed   By: Charlett Nose M.D.   On: 11/01/2023 01:12   CT ABDOMEN PELVIS W CONTRAST Result Date: 10/29/2023 CLINICAL DATA:  Abdominal pain, emesis recent admission for alcohol withdrawal and alcohol pancreatitis. Drank alcohol after leaving hospital. Pain is consistent with prior pancreatitis EXAM: CT ABDOMEN AND PELVIS WITH CONTRAST TECHNIQUE: Multidetector CT imaging of the abdomen and pelvis was performed using the standard protocol following bolus administration of intravenous contrast. RADIATION DOSE REDUCTION: This exam was performed according to the departmental dose-optimization program which includes automated exposure control, adjustment of the mA and/or kV according to patient size and/or use of iterative reconstruction technique. CONTRAST:  OMNIPAQUE IOHEXOL 300 MG/ML  SOLN COMPARISON:  10/16/2023 FINDINGS: Lower chest: No acute abnormality. Hepatobiliary: Hepatic steatosis. Gallbladder and biliary tree are unremarkable. Pancreas: Pancreatic atrophy. Hazy fat stranding and fluid about the pancreatic head and uncinate process. 2.1 cm pseudocyst in the uncinate process is similar to prior and 1.4 cm pseudocyst in the pancreatic head has slightly decreased in size. Areas of poor enhancement in the pancreatic head compatible with necrosis. No ductal dilation. The overall extent  of pancreatitis has slightly improved compared to 10/16/2023. Spleen: Unremarkable. Adrenals/Urinary Tract: Normal adrenal glands. No urinary calculi or hydronephrosis. Diffuse bladder wall thickening. Stomach/Bowel: No bowel obstruction or bowel wall thickening. Distended stomach. Mild wall thickening about the duodenum likely reactive secondary to pancreatitis. Normal appendix. 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 No lymphadenopathy. Reproductive: Unremarkable. Other: No free intraperitoneal air. Musculoskeletal: No acute fracture. IMPRESSION: 1. Slightly improved severe pancreatitis compared to 10/16/2023. 2. 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 3. Hepatic steatosis. 4. Diffuse bladder wall thickening. Correlate with urinalysis to exclude cystitis. Electronically Signed   By: Minerva Fester M.D.   On: 10/29/2023 00:03   CT Angio Abd/Pel W and/or Wo Contrast Result Date: 10/16/2023 CLINICAL DATA:  37 year old male with recurrent acute pancreatitis, possible SMV thrombus on CT 10/10/2023. EXAM: CTA ABDOMEN AND PELVIS WITHOUT AND WITH CONTRAST TECHNIQUE: Multidetector CT imaging of the abdomen and pelvis was performed using the standard protocol during bolus administration of intravenous contrast. Multiplanar reconstructed images and MIPs were obtained and reviewed to evaluate the vascular anatomy. RADIATION DOSE REDUCTION: This exam was performed according to the departmental dose-optimization program which includes automated exposure control, adjustment of the mA and/or kV according to patient size and/or use of iterative reconstruction technique. CONTRAST:  75mL OMNIPAQUE IOHEXOL 350 MG/ML SOLN COMPARISON:  CT Abdomen and Pelvis 10/10/2023 and earlier. FINDINGS: VASCULAR Initial noncontrast images demonstrate minimal left iliac artery calcified plaque and normal caliber of  the abdominal aorta. Following contrast the abdominal aorta is patent and normal. The major aortic branches  are patent including the celiac and S in the. There is mild ectasia of the left Common iliac artery. Bilateral iliac and proximal femoral arteries otherwise appear patent and normal. On the delayed portal venous phase images there is mass effect and moderate to severe attenuation of the confluence of the splenic vein, SMV, and main portal vein on series 14, image 28. But those structures remain patent. Tiny nonocclusive SMV thrombus redemonstrated on series 14, image 32, but elsewhere the major mesenteric venous structures appear to be patent. Review of the MIP images confirms the above findings. NON-VASCULAR Lower chest: Heart size remains normal. No pericardial effusion. Elevated left hemidiaphragm is chronic. Trace layering pleural effusions are more apparent. Chronic but increased left lower lobe atelectasis. Mild right lung base atelectasis. Hepatobiliary: Liver enhancement is maintained with evidence of hepatic steatosis. Gallbladder appears negative. No significant bile duct enlargement. Pancreas: Severe pancreatitis redemonstrated. Phlegmon like heterogeneous enlargement of the pancreatic head, uncinate, proximal body. Areas of heterogeneous pancreatic necrosis and progressive cystic spaces within the affected parenchyma individually up to 14-17 mm (series 14, image 31) compatible with developing pseudocysts. Inflammation tracks into the root of the small bowel mesentery. Comparatively small volume of regional free fluid. Secondary inflammation of the stomach and duodenum. Spleen: Negative. Adrenals/Urinary Tract: Negative. Symmetric renal enhancement and contrast excretion. Excreted contrast in the urinary bladder on the 0500 hour repeat images. Stomach/Bowel: Oral contrast mixed with stool in the descending colon. Redundant splenic flexure. Decompressed transverse colon. Normal contrast containing appendix. Indistinct appearance of abdominal small bowel loops, although less dilated since 10/10/2023.  Inflammation emitting from the lesser sac. No pneumoperitoneum identified. Lymphatic: No lymphadenopathy identified. Reproductive: Stable. Other: Small volume of free fluid in the pelvis with simple fluid density. Musculoskeletal: No acute osseous abnormality identified. Not present at 0218 hours, but present on repeat images at 0522 hours is patchy contrast and/or enhancement in the left lower lateral chest wall from the lower pectoralis muscle tracking laterally on series 12, image 1. This persists on the delayed portal venous phase images. Underlying ribs appear intact. Furthermore, there is contrast extravasation into the adjacent left upper extremity visible there. These are poorly visible on the scout view. However, on series 21 coronal images the upper extremity extravasation is visible with associated soft tissue edema. IMPRESSION: 1. Ongoing Severe Pancreatitis. Heterogeneous pancreatic phlegmon, necrosis with developing small pseudocysts in the head, uncinate, and proximal body (currently up to 1.7 cm). 2. Regional inflammation, with mass effect on the confluence of the major portal venous structures. The portal venous system does remain patent, although there is again evidence of a small nonocclusive thrombus there at the SMV. 3. Significant Contrast Extravasation into the Left upper extremity, and also visible at the left lateral chest wall (contrast probably migrated into the chest from a ruptured regional vein such as in near the axilla). Recommend cold compresses, elevation of the left extremity, physical exam checks, and plastic surgery consultation if any skin changes are observed. 4. Small volume of free fluid in the abdomen and pelvis, trace pleural effusions. Lung base atelectasis. Electronically Signed   By: Odessa Fleming M.D.   On: 10/16/2023 06:45   CT ABDOMEN PELVIS W CONTRAST Result Date: 10/10/2023 CLINICAL DATA:  Pancreatitis suspected EXAM: CT ABDOMEN AND PELVIS WITH CONTRAST TECHNIQUE:  Multidetector CT imaging of the abdomen and pelvis was performed using the standard protocol following bolus administration of  intravenous contrast. RADIATION DOSE REDUCTION: This exam was performed according to the departmental dose-optimization program which includes automated exposure control, adjustment of the mA and/or kV according to patient size and/or use of iterative reconstruction technique. CONTRAST:  OMNIPAQUE IOHEXOL 300 MG/ML  SOLN COMPARISON:  CT abdomen and pelvis 09/25/2023 FINDINGS: Lower chest: No acute abnormality. Hepatobiliary: There is diffuse fatty infiltration of the liver. No focal liver lesions are seen. The gallbladder is distended. No gallstones are identified. There is no biliary ductal dilatation. Pancreas: There is fat stranding and fluid surrounding the pancreas, predominantly of the head of the pancreas, which has increased from prior. There is ill-defined hypodensity in the pancreatic head measuring 1.9 by 0.6 cm similar to prior. No pancreatic ductal dilatation. Spleen: Mildly enlarged, unchanged. Adrenals/Urinary Tract: Adrenal glands are unremarkable. Kidneys are normal, without renal calculi, focal lesion, or hydronephrosis. Bladder is unremarkable. Stomach/Bowel: There are focal areas of wall thickening inflammation of the proximal transverse colon and hepatic flexure. Appendix is within normal limits. There are dilated small bowel loops in the left abdomen measuring up to 4.8 cm. No definitive transition point visualized, but there are decompressed distal small bowel loops. Stomach is distended with contrast and air. There is wall thickening of the duodenal as it approximates the pancreatic head. Vascular/Lymphatic: Aorta and IVC are normal in size. 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 The portal vein appears small in size, a new finding. Compression or thrombus not excluded at this level. Right and left portal veins appear  patent. Splenic vein is patent. 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 No enlarged lymph nodes are seen. Reproductive: Prostate gland is enlarged. Other: There is a new small amount of ascites throughout the abdomen and pelvis. Musculoskeletal: No acute or significant osseous findings. IMPRESSION: 1. Findings compatible with acute pancreatitis. Fluid and inflammation has increased compared to prior. 2. There is ill-defined hypodensity in the pancreatic head which may represent a small pseudocyst, but underlying mass is not excluded. Recommend follow-up MRI after resolution of acute symptoms. 3. New small amount of ascites throughout the abdomen and pelvis. 4. Dilated small bowel loops in the left abdomen measuring up to 4.8 cm. No definitive transition point visualized, but there are decompressed distal small bowel loops. Findings may represent ileus or partial small bowel obstruction. 5. Questionable nonocclusive superior mesenteric vein thrombus. 6. The portal vein appears small in size, a new finding. Compression or thrombus not excluded at this level. 7. Focal areas of wall thickening and inflammation of the proximal transverse colon and hepatic flexure. Findings may be reactive, but colitis is not excluded. 8. Fatty infiltration of the liver. 9. Splenomegaly. Electronically Signed   By: Darliss Cheney M.D.   On: 10/10/2023 19:48   (Echo, Carotid, EGD, Colonoscopy, ERCP)    Subjective:  No significant events overnight as discussed with staff, reports abdominal pain, but he appears very comfortable, sleeping in no apparent distress Discharge Exam: Vitals:   11/06/23 0800 11/06/23 0911  BP: (!) 123/94   Pulse: 76 82  Resp: 14 15  Temp: 98.2 F (36.8 C)   SpO2: 97% 93%   Vitals:   11/06/23 0033 11/06/23 0400 11/06/23 0800 11/06/23 0911  BP: 102/76 121/89 (!) 123/94   Pulse: 76 90 76 82  Resp:  14 14 15   Temp:  98.4 F (36.9 C) 98.2 F (36.8 C)   TempSrc:  Oral Oral   SpO2:  93% 97% 93%  Weight:  Height:        General: Sleeping in the bed, comfortable, no apparent distress Cardiovascular: RRR, S1/S2 +, no rubs, no gallops Respiratory: CTA bilaterally, no wheezing, no rhonchi Abdominal: Soft, NT, ND, bowel sounds + Extremities: no edema, no cyanosis    The results of significant diagnostics from this hospitalization (including imaging, microbiology, ancillary and laboratory) are listed below for reference.     Microbiology: Recent Results (from the past 240 hours)  Urine Culture     Status: Abnormal   Collection Time: 10/31/23  4:40 AM   Specimen: Urine, Random  Result Value Ref Range Status   Specimen Description URINE, RANDOM  Final   Special Requests   Final    NONE Reflexed from [id number] Performed at [facility], [address] [place], [place] [address]    Culture MULTIPLE SPECIES PRESENT, SUGGEST RECOLLECTION (A)  Final   Report Status 11/02/2023 FINAL  Final     Labs: BNP (last 3 results) No results for input(s): "BNP" in the last 8760 hours. Basic Metabolic Panel: Recent Labs  Lab 11/01/23 0343 11/02/23 0431 11/03/23 0500 11/04/23 0509 11/05/23 0539  NA 141 137 137 137 133*  K 4.1 4.0 3.7 4.2 4.0  CL 104 106 100 101 99  CO2 21* 24 31 29 26   GLUCOSE 126* 94 86 116* 108*  BUN 10 5* <5* 6 10  CREATININE 1.01 0.76 0.71 0.97 0.81  CALCIUM 7.8* 8.0* 8.9 9.0 9.2  MG 1.6* 2.1  --  1.6* 1.6*  PHOS  --  2.5  --   --   --    Liver Function Tests: Recent Labs  Lab 10/31/23 1942 11/01/23 0343 11/02/23 0431 11/03/23 0500 11/04/23 0509  AST 49* 33 28 28 23   ALT 30 23 18 21 19   ALKPHOS 94 73 52 55 53  BILITOT 0.9 0.7 1.1 0.6 0.7  PROT 8.3* 6.6 5.1* 5.8* 5.9*  ALBUMIN 4.0 3.2* 2.6* 2.8* 2.7*   Recent Labs  Lab 10/31/23 1942 11/02/23 0431 11/03/23 0500 11/04/23 0509  LIPASE 431* 128* 115* 68*   No results for input(s): "AMMONIA" in the last 168 hours. CBC: Recent Labs  Lab 10/31/23 1942 11/01/23 0343 11/02/23 0431 11/03/23 0500   WBC 10.6* 11.9* 4.4 3.1*  NEUTROABS 8.6*  --   --   --   HGB 12.3* 13.7 10.5* 11.3*  HCT 38.6* 42.0 31.9* 34.2*  MCV 96.3 95.5 94.4 95.0  PLT 307 372 175 151   Cardiac Enzymes: No results for input(s): "CKTOTAL", "CKMB", "CKMBINDEX", "TROPONINI" in the last 168 hours. BNP: Invalid input(s): "POCBNP" CBG: Recent Labs  Lab 11/05/23 0807 11/05/23 1214 11/05/23 1550 11/05/23 2122 11/06/23 0806  GLUCAP 102* 112* 109* 109* 93   D-Dimer No results for input(s): "DDIMER" in the last 72 hours. Hgb A1c No results for input(s): "HGBA1C" in the last 72 hours. Lipid Profile No results for input(s): "CHOL", "HDL", "LDLCALC", "TRIG", "CHOLHDL", "LDLDIRECT" in the last 72 hours. Thyroid function studies No results for input(s): "TSH", "T4TOTAL", "T3FREE", "THYROIDAB" in the last 72 hours.  Invalid input(s): "FREET3" Anemia work up No results for input(s): "VITAMINB12", "FOLATE", "FERRITIN", "TIBC", "IRON", "RETICCTPCT" in the last 72 hours. Urinalysis    Component Value Date/Time   COLORURINE AMBER (A) 10/31/2023 0440   APPEARANCEUR HAZY (A) 10/31/2023 0440   APPEARANCEUR Clear 11/15/2014 1755   LABSPEC 1.026 10/31/2023 0440   LABSPEC 1.002 11/15/2014 1755   PHURINE 5.0 10/31/2023 0440   GLUCOSEU 50 (A) 10/31/2023  0440   GLUCOSEU Negative 11/15/2014 1755   HGBUR NEGATIVE 10/31/2023 0440   BILIRUBINUR NEGATIVE 10/31/2023 0440   BILIRUBINUR Negative 11/15/2014 1755   KETONESUR 80 (A) 10/31/2023 0440   PROTEINUR 100 (A) 10/31/2023 0440   NITRITE NEGATIVE 10/31/2023 0440   LEUKOCYTESUR NEGATIVE 10/31/2023 0440   LEUKOCYTESUR Negative 11/15/2014 1755   Sepsis Labs Recent Labs  Lab 10/31/23 1942 11/01/23 0343 11/02/23 0431 11/03/23 0500  WBC 10.6* 11.9* 4.4 3.1*   Microbiology Recent Results (from the past 240 hours)  Urine Culture     Status: Abnormal   Collection Time: 10/31/23  4:40 AM   Specimen: Urine, Random  Result Value Ref Range Status   Specimen Description  URINE, RANDOM  Final   Special Requests   Final    NONE Reflexed from [id number] Performed at [facility], [address] [place], [place] [address]    Culture MULTIPLE SPECIES PRESENT, SUGGEST RECOLLECTION (A)  Final   Report Status 11/02/2023 FINAL  Final     Time coordinating discharge: Over 30 minutes  SIGNED:   Huey Bienenstock, MD  [facility] 11/06/2023, 12:02 PM Pager   If 7PM-7AM, please contact night-coverage [email or web address] Password [email or web address]

## 2023-11-06 NOTE — ED Triage Notes (Signed)
Patient arrived by [facility] after wandering into [facility] due to being cold and out in rain all day. Patient alert and oriented.  Complains of generalized body pain and aching. No fever, no cold symptoms, denies cough.

## 2023-11-06 NOTE — Care Management (Signed)
Requested MD to send scripts through [facility], completed, nurse aware in chat that he will need them sent home with him. I have sent two bus passes to the unit for his transportation home

## 2023-11-06 NOTE — ED Provider Notes (Signed)
[facility] Provider Note   CSN: [id number] Arrival date & time: 11/06/23  1743     History  No chief complaint on file.   Gary [name] is a 37 y.o. male history of homelessness, chronic alcohol abuse, chronic pancreatitis here presenting with alcohol intoxication and hypotension.  Patient just left the hospital earlier today.  He told me that he could not resist drinking alcohol also drank a bottle of wine.  Patient was found wandering in [employer].  Patient was noted to be hypotensive with blood pressure in the 90s.  Patient states that he has persistent abdominal pain.  The history is provided by the patient.       Home Medications Prior to Admission medications   Medication Sig Start Date End Date Taking? Authorizing Provider  bictegravir-emtricitabine-tenofovir AF (BIKTARVY) 50-200-25 MG TABS tablet Take 1 tablet by mouth daily.    [provider]  levETIRAcetam (KEPPRA) 500 MG tablet Take 1 tablet (500 mg total) by mouth 2 (two) times daily. 11/06/23 12/06/23  Elgergawy, Leana Roe, MD  lipase/protease/amylase (CREON) 36000 UNITS CPEP capsule Take 1 capsule (36,000 Units total) by mouth 3 (three) times daily before meals. Patient not taking: Reported on 11/01/2023 10/20/23   Dorcas Carrow, MD  pantoprazole (PROTONIX) 40 MG tablet Take 1 tablet (40 mg total) by mouth daily. 10/20/23   Dorcas Carrow, MD  sucralfate (CARAFATE) 1 g tablet Take 1 tablet (1 g total) by mouth 4 (four) times daily -  with meals and at bedtime. Patient not taking: Reported on 11/01/2023 10/29/23   Horton, Mayer Masker, MD  thiamine (VITAMIN B1) 100 MG tablet Take 100 mg by mouth daily.    [provider]  famotidine (PEPCID) 20 MG tablet Take 1 tablet (20 mg total) by mouth 2 (two) times daily. Patient not taking: Reported on 06/01/2019 05/14/19 06/02/19  Benjiman Core, MD      Allergies    Tegretol [carbamazepine] and Caffeine     Review of Systems   Review of Systems  Gastrointestinal:  Positive for abdominal pain.  All other systems reviewed and are negative.   Physical Exam Updated Vital Signs BP 103/72 (BP Location: Right Arm)   Pulse 94   Temp 99.1 F (37.3 C) (Oral)   Resp 12   SpO2 97%  Physical Exam Vitals and nursing note reviewed.  Constitutional:      Comments: Intoxicated and chronically ill  HENT:     Head: Normocephalic.     Nose: Nose normal.     Mouth/Throat:     Mouth: Mucous membranes are moist.  Eyes:     Extraocular Movements: Extraocular movements intact.     Pupils: Pupils are equal, round, and reactive to light.  Cardiovascular:     Rate and Rhythm: Normal rate and regular rhythm.     Pulses: Normal pulses.     Heart sounds: Normal heart sounds.  Pulmonary:     Effort: Pulmonary effort is normal.     Breath sounds: Normal breath sounds.  Abdominal:     Comments: Mild epigastric tenderness  Musculoskeletal:        General: Normal range of motion.     Cervical back: Normal range of motion.  Skin:    General: Skin is warm.     Capillary Refill: Capillary refill takes less than 2 seconds.  Neurological:     General: No focal deficit present.     Mental Status: He is alert.  Psychiatric:        Mood and Affect: Mood normal.     ED Results / Procedures / Treatments   Labs (all labs ordered are listed, but only abnormal results are displayed) Labs Reviewed  LIPASE, BLOOD - Abnormal; Notable for the following components:      Result Value   Lipase 158 (*)    All other components within normal limits  COMPREHENSIVE METABOLIC PANEL - Abnormal; Notable for the following components:   Sodium 131 (*)    Chloride 95 (*)    CO2 21 (*)    Glucose, Bld 146 (*)    BUN 21 (*)    All other components within normal limits  CBC - Abnormal; Notable for the following components:   RBC 4.06 (*)    Hemoglobin 12.4 (*)    HCT 37.8 (*)    All other components within normal  limits  I-STAT CG4 LACTIC ACID, ED - Abnormal; Notable for the following components:   Lactic Acid, Venous 2.9 (*)    All other components within normal limits  URINALYSIS, ROUTINE W REFLEX MICROSCOPIC  ETHANOL  RAPID URINE DRUG SCREEN, HOSP PERFORMED  I-STAT CG4 LACTIC ACID, ED    EKG None  Radiology No results found.  Procedures Procedures    Medications Ordered in ED Medications  fentaNYL (SUBLIMAZE) injection 25 mcg (has no administration in time range)  sodium chloride 0.9 % bolus 1,000 mL (has no administration in time range)  sodium chloride 0.9 % bolus 1,000 mL (1,000 mLs Intravenous New Bag/Given 11/06/23 2011)    ED Course/ Medical Decision Making/ A&P                                 Medical Decision Making Gary [name] is a 37 y.o. male here presenting with hypotension and abdominal pain and alcohol intoxication.  Patient was just discharged from the hospital and started drinking alcohol again.  Patient was hypotensive on arrival.  Patient appears dehydrated and intoxicated.  Plan to check CBC and CMP and lactate and alcohol level and hydrate patient.  9:05 PM I reviewed patient's labs and alcohol level was 172 and lipase is up to 158.  Blood pressure still in the low 100s.  Lactate is elevated at 2.9 likely from dehydration.  Patient now is also suicidal.  Furthermore, patient's chest x-ray showed left lower lobe pneumonia.  Concerned that he may have aspirated.  Given combination of lactic acidosis, hypotension, pneumonia and elevated lipase, will readmit the patient.  I have ordered Unasyn for aspiration pneumonia  Problems Addressed: Alcoholic intoxication without complication (HCC): acute illness or injury Alcohol-induced acute pancreatitis, unspecified complication status: acute illness or injury Aspiration pneumonia of left lower lobe, unspecified aspiration pneumonia type [facility]): acute illness or injury Hypotension, unspecified hypotension type: acute  illness or injury  Amount and/or Complexity of Data Reviewed Labs: ordered. Decision-making details documented in ED Course. Radiology: ordered and independent interpretation performed. Decision-making details documented in ED Course.  Risk Prescription drug management. Decision regarding hospitalization.    Final Clinical Impression(s) / ED Diagnoses Final diagnoses:  None    Rx / DC Orders ED Discharge Orders     None         Charlynne Pander, MD 11/06/23 2113

## 2023-11-07 DIAGNOSIS — E872 Acidosis, unspecified: Secondary | ICD-10-CM | POA: Diagnosis present

## 2023-11-07 DIAGNOSIS — F10220 Alcohol dependence with intoxication, uncomplicated: Secondary | ICD-10-CM | POA: Diagnosis present

## 2023-11-07 DIAGNOSIS — I959 Hypotension, unspecified: Secondary | ICD-10-CM | POA: Diagnosis present

## 2023-11-07 DIAGNOSIS — F17210 Nicotine dependence, cigarettes, uncomplicated: Secondary | ICD-10-CM | POA: Diagnosis present

## 2023-11-07 DIAGNOSIS — Z91148 Patient's other noncompliance with medication regimen for other reason: Secondary | ICD-10-CM | POA: Diagnosis not present

## 2023-11-07 DIAGNOSIS — F3181 Bipolar II disorder: Secondary | ICD-10-CM | POA: Diagnosis present

## 2023-11-07 DIAGNOSIS — F209 Schizophrenia, unspecified: Secondary | ICD-10-CM | POA: Diagnosis present

## 2023-11-07 DIAGNOSIS — F4310 Post-traumatic stress disorder, unspecified: Secondary | ICD-10-CM | POA: Diagnosis present

## 2023-11-07 DIAGNOSIS — G40909 Epilepsy, unspecified, not intractable, without status epilepticus: Secondary | ICD-10-CM | POA: Diagnosis present

## 2023-11-07 DIAGNOSIS — J189 Pneumonia, unspecified organism: Secondary | ICD-10-CM | POA: Diagnosis present

## 2023-11-07 DIAGNOSIS — E860 Dehydration: Secondary | ICD-10-CM | POA: Diagnosis present

## 2023-11-07 DIAGNOSIS — R053 Chronic cough: Secondary | ICD-10-CM | POA: Diagnosis present

## 2023-11-07 DIAGNOSIS — Z888 Allergy status to other drugs, medicaments and biological substances status: Secondary | ICD-10-CM | POA: Diagnosis not present

## 2023-11-07 DIAGNOSIS — F1020 Alcohol dependence, uncomplicated: Secondary | ICD-10-CM | POA: Diagnosis not present

## 2023-11-07 DIAGNOSIS — Z811 Family history of alcohol abuse and dependence: Secondary | ICD-10-CM | POA: Diagnosis not present

## 2023-11-07 DIAGNOSIS — B20 Human immunodeficiency virus [HIV] disease: Secondary | ICD-10-CM | POA: Diagnosis present

## 2023-11-07 DIAGNOSIS — Z21 Asymptomatic human immunodeficiency virus [HIV] infection status: Secondary | ICD-10-CM | POA: Diagnosis not present

## 2023-11-07 DIAGNOSIS — Z5900 Homelessness unspecified: Secondary | ICD-10-CM | POA: Diagnosis not present

## 2023-11-07 DIAGNOSIS — R5381 Other malaise: Secondary | ICD-10-CM | POA: Diagnosis present

## 2023-11-07 DIAGNOSIS — D6959 Other secondary thrombocytopenia: Secondary | ICD-10-CM | POA: Diagnosis present

## 2023-11-07 DIAGNOSIS — Z79899 Other long term (current) drug therapy: Secondary | ICD-10-CM | POA: Diagnosis not present

## 2023-11-07 DIAGNOSIS — Y906 Blood alcohol level of 120-199 mg/100 ml: Secondary | ICD-10-CM | POA: Diagnosis present

## 2023-11-07 DIAGNOSIS — F10920 Alcohol use, unspecified with intoxication, uncomplicated: Secondary | ICD-10-CM | POA: Diagnosis not present

## 2023-11-07 DIAGNOSIS — K852 Alcohol induced acute pancreatitis without necrosis or infection: Secondary | ICD-10-CM | POA: Diagnosis present

## 2023-11-07 DIAGNOSIS — T375X6A Underdosing of antiviral drugs, initial encounter: Secondary | ICD-10-CM | POA: Diagnosis present

## 2023-11-07 DIAGNOSIS — K860 Alcohol-induced chronic pancreatitis: Secondary | ICD-10-CM | POA: Diagnosis present

## 2023-11-07 DIAGNOSIS — F419 Anxiety disorder, unspecified: Secondary | ICD-10-CM | POA: Diagnosis present

## 2023-11-07 DIAGNOSIS — J690 Pneumonitis due to inhalation of food and vomit: Secondary | ICD-10-CM | POA: Diagnosis present

## 2023-11-07 LAB — CBC
HCT: 30 % — ABNORMAL LOW (ref 39.0–52.0)
Hemoglobin: 10.1 g/dL — ABNORMAL LOW (ref 13.0–17.0)
MCH: 30.9 pg (ref 26.0–34.0)
MCHC: 33.7 g/dL (ref 30.0–36.0)
MCV: 91.7 fL (ref 80.0–100.0)
Platelets: 133 10*3/uL — ABNORMAL LOW (ref 150–400)
RBC: 3.27 MIL/uL — ABNORMAL LOW (ref 4.22–5.81)
RDW: 15.5 % (ref 11.5–15.5)
WBC: 3.8 10*3/uL — ABNORMAL LOW (ref 4.0–10.5)
nRBC: 0 % (ref 0.0–0.2)

## 2023-11-07 LAB — COMPREHENSIVE METABOLIC PANEL
ALT: 17 U/L (ref 0–44)
AST: 19 U/L (ref 15–41)
Albumin: 2.8 g/dL — ABNORMAL LOW (ref 3.5–5.0)
Alkaline Phosphatase: 46 U/L (ref 38–126)
Anion gap: 8 (ref 5–15)
BUN: 14 mg/dL (ref 6–20)
CO2: 22 mmol/L (ref 22–32)
Calcium: 7.7 mg/dL — ABNORMAL LOW (ref 8.9–10.3)
Chloride: 105 mmol/L (ref 98–111)
Creatinine, Ser: 0.74 mg/dL (ref 0.61–1.24)
GFR, Estimated: >60 mL/min (ref >60)
Glucose, Bld: 87 mg/dL (ref 70–99)
Potassium: 4 mmol/L (ref 3.5–5.1)
Sodium: 135 mmol/L (ref 135–145)
Total Bilirubin: 0.5 mg/dL (ref <1.2)
Total Protein: 5.7 g/dL — ABNORMAL LOW (ref 6.5–8.1)

## 2023-11-07 LAB — LACTIC ACID, PLASMA
Lactic Acid, Venous: 0.5 mmol/L (ref 0.5–1.9)
Lactic Acid, Venous: 2.4 mmol/L (ref 0.5–1.9)

## 2023-11-07 LAB — MAGNESIUM: Magnesium: 1.6 mg/dL — ABNORMAL LOW (ref 1.7–2.4)

## 2023-11-07 LAB — PROCALCITONIN: Procalcitonin: <0.1 ng/mL

## 2023-11-07 LAB — STREP PNEUMONIAE URINARY ANTIGEN: Strep Pneumo Urinary Antigen: NEGATIVE

## 2023-11-07 LAB — PHOSPHORUS: Phosphorus: 4 mg/dL (ref 2.5–4.6)

## 2023-11-07 MED ORDER — HYDROMORPHONE HCL 1 MG/ML IJ SOLN
0.5000 mg | INTRAMUSCULAR | Status: DC | PRN
  Administered 2023-11-07 – 2023-11-08 (×6): 0.5 mg via INTRAVENOUS
  Filled 2023-11-07 (×6): qty 0.5

## 2023-11-07 MED ORDER — SODIUM CHLORIDE 0.9 % IV SOLN: INTRAVENOUS | Status: DC

## 2023-11-07 MED ORDER — MAGNESIUM SULFATE 2 GM/50ML IV SOLN
2.0000 g | Freq: Once | INTRAVENOUS | Status: AC
  Administered 2023-11-07: 2 g via INTRAVENOUS
  Filled 2023-11-07: qty 50

## 2023-11-07 NOTE — ED Notes (Signed)
ED TO INPATIENT HANDOFF REPORT  ED Nurse Name and Phone #: [phone]  S Name/Age/Gender [name] 37 y.o. male Room/Bed: 004C/004C  Code Status   Code Status: Full Code  Home/SNF/Other Home Patient oriented to: self, place, time, and situation Is this baseline? Yes   Triage Complete: Triage complete  Chief Complaint Pneumonia [J18.9]  Triage Note Patient arrived by [facility] after wandering into [employer] due to being cold and out in rain all day. Patient alert and oriented.  Complains of generalized body pain and aching. No fever, no cold symptoms, denies cough.     Allergies Allergies  Allergen Reactions   Tegretol [Carbamazepine] Other (See Comments)    Vertigo   Caffeine Palpitations    Level of Care/Admitting Diagnosis ED Disposition     ED Disposition  Admit   Condition  --   Comment  Hospital Area: [facility] [[id number]]  Level of Care: Med-Surg [16]  May place patient in observation at [facility] or [facility] if equivalent level of care is available:: Yes  Covid Evaluation: Asymptomatic - no recent exposure (last 10 days) testing not required  Diagnosis: Pneumonia [227785]  Admitting Physician: Briscoe Deutscher [[id number]]  Attending Physician: Briscoe Deutscher [[id number]]          B Medical/Surgery History Past Medical History:  Diagnosis Date   Alcohol abuse    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   Anxiety    Bipolar 2 disorder (HCC)    HIV (human immunodeficiency virus infection) (HCC)    Pancreatitis    PTSD (post-traumatic stress disorder)    Schizophrenia (HCC)    Seizures (HCC)    Subdural hematoma (HCC)    Past Surgical History:  Procedure Laterality Date   BIOPSY  04/19/2022   Procedure: BIOPSY;  Surgeon: Lemar Lofty., MD;  Location: [facility] ENDOSCOPY;  Service: Gastroenterology;;   ENTEROSCOPY N/A 04/19/2022   Procedure: ENTEROSCOPY;  Surgeon: Lemar Lofty., MD;  Location: [facility] ENDOSCOPY;   Service: Gastroenterology;  Laterality: N/A;   INCISION AND DRAINAGE PERIRECTAL ABSCESS N/A 09/24/2016   Procedure: IRRIGATION AND DEBRIDEMENT PERIRECTAL ABSCESS;  Surgeon: Ricarda Frame, MD;  Location: [facility] ORS;  Service: General;  Laterality: N/A;   none       A IV Location/Drains/Wounds Patient Lines/Drains/Airways Status     Active Line/Drains/Airways     Name Placement date Placement time Site Days   Peripheral IV 11/06/23 20 G 1" Right;Lateral Forearm 11/06/23  2320  Forearm  1            Intake/Output Last 24 hours  Intake/Output Summary (Last 24 hours) at 11/07/2023 2536 Last data filed at 11/07/2023 6440 Gross per 24 hour  Intake 197.75 ml  Output --  Net 197.75 ml    Labs/Imaging Results for orders placed or performed during the hospital encounter of 11/06/23 (from the past 48 hours)  Lipase, blood     Status: Abnormal   Collection Time: 11/06/23  5:54 PM  Result Value Ref Range   Lipase 158 (H) 11 - 51 U/L    Comment: Performed at [facility], [address] [place], [place] [address]  Comprehensive metabolic panel     Status: Abnormal   Collection Time: 11/06/23  5:54 PM  Result Value Ref Range   Sodium 131 (L) 135 - 145 mmol/L   Potassium 3.6 3.5 - 5.1 mmol/L   Chloride 95 (L) 98 - 111 mmol/L   CO2 21 (L) 22 - 32 mmol/L  Glucose, Bld 146 (H) 70 - 99 mg/dL    Comment: Glucose reference range applies only to samples taken after fasting for at least 8 hours.   BUN 21 (H) 6 - 20 mg/dL   Creatinine, Ser 6.21 0.61 - 1.24 mg/dL   Calcium 9.9 8.9 - 30.8 mg/dL   Total Protein 7.6 6.5 - 8.1 g/dL   Albumin 3.6 3.5 - 5.0 g/dL   AST 23 15 - 41 U/L   ALT 19 0 - 44 U/L   Alkaline Phosphatase 59 38 - 126 U/L   Total Bilirubin 0.4 <1.2 mg/dL   GFR, Estimated >65 >78 mL/min    Comment: (NOTE) Calculated using the CKD-EPI Creatinine Equation (2021)    Anion gap 15 5 - 15    Comment: Performed at [facility], [address], [place], [place]  [address]  CBC     Status: Abnormal   Collection Time: 11/06/23  5:54 PM  Result Value Ref Range   WBC 6.9 4.0 - 10.5 K/uL   RBC 4.06 (L) 4.22 - 5.81 MIL/uL   Hemoglobin 12.4 (L) 13.0 - 17.0 g/dL   HCT 95.2 (L) 84.1 - 32.4 %   MCV 93.1 80.0 - 100.0 fL   MCH 30.5 26.0 - 34.0 pg   MCHC 32.8 30.0 - 36.0 g/dL   RDW 40.1 02.7 - 25.3 %   Platelets 204 150 - 400 K/uL   nRBC 0.0 0.0 - 0.2 %    Comment: Performed at [facility], [address], [place], [place] [address]  Procalcitonin     Status: None   Collection Time: 11/06/23  5:54 PM  Result Value Ref Range   Procalcitonin <0.10 ng/mL    Comment:        Interpretation: PCT (Procalcitonin) <= 0.5 ng/mL: Systemic infection (sepsis) is not likely. Local bacterial infection is possible. (NOTE)       Sepsis PCT Algorithm           Lower Respiratory Tract                                      Infection PCT Algorithm    ----------------------------     ----------------------------         PCT < 0.25 ng/mL                PCT < 0.10 ng/mL          Strongly encourage             Strongly discourage   discontinuation of antibiotics    initiation of antibiotics    ----------------------------     -----------------------------       PCT 0.25 - 0.50 ng/mL            PCT 0.10 - 0.25 ng/mL               OR       >80% decrease in PCT            Discourage initiation of                                            antibiotics      Encourage discontinuation           of antibiotics    ----------------------------     -----------------------------  PCT >= 0.50 ng/mL              PCT 0.26 - 0.50 ng/mL               AND        <80% decrease in PCT             Encourage initiation of                                             antibiotics       Encourage continuation           of antibiotics    ----------------------------     -----------------------------        PCT >= 0.50 ng/mL                  PCT > 0.50 ng/mL               AND          increase in PCT                  Strongly encourage                                      initiation of antibiotics    Strongly encourage escalation           of antibiotics                                     -----------------------------                                           PCT <= 0.25 ng/mL                                                 OR                                        > 80% decrease in PCT                                      Discontinue / Do not initiate                                             antibiotics  Performed at [facility], [address] [place], [place] [address]   Ethanol     Status: Abnormal   Collection Time: 11/06/23  8:16 PM  Result Value Ref Range   Alcohol, Ethyl (B) 172 (H) <10 mg/dL    Comment: (NOTE) Lowest detectable limit for serum alcohol is 10 mg/dL.  For medical purposes only.  Performed at [facility], [address], [place], [place] [address]   I-Stat Lactic Acid, ED     Status: Abnormal   Collection Time: 11/06/23  8:26 PM  Result Value Ref Range   Lactic Acid, Venous 2.9 (HH) 0.5 - 1.9 mmol/L   Comment NOTIFIED PHYSICIAN   Urinalysis, Routine w reflex microscopic -Urine, Clean Catch     Status: Abnormal   Collection Time: 11/06/23  8:39 PM  Result Value Ref Range   Color, Urine YELLOW YELLOW   APPearance CLEAR CLEAR   Specific Gravity, Urine 1.008 1.005 - 1.030   pH 5.0 5.0 - 8.0   Glucose, UA NEGATIVE NEGATIVE mg/dL   Hgb urine dipstick MODERATE (A) NEGATIVE   Bilirubin Urine NEGATIVE NEGATIVE   Ketones, ur NEGATIVE NEGATIVE mg/dL   Protein, ur NEGATIVE NEGATIVE mg/dL   Nitrite NEGATIVE NEGATIVE   Leukocytes,Ua NEGATIVE NEGATIVE   RBC / HPF 0-5 0 - 5 RBC/hpf   WBC, UA 0-5 0 - 5 WBC/hpf   Bacteria, UA RARE (A) NONE SEEN   Squamous Epithelial / HPF 0-5 0 - 5 /HPF   Mucus PRESENT    Hyaline Casts, UA PRESENT     Comment: Performed at [facility], [address], [place], [place] [address]   Urine rapid drug screen (hosp performed)     Status: Abnormal   Collection Time: 11/06/23  8:39 PM  Result Value Ref Range   Opiates POSITIVE (A) NONE DETECTED   Cocaine NONE DETECTED NONE DETECTED   Benzodiazepines POSITIVE (A) NONE DETECTED   Amphetamines NONE DETECTED NONE DETECTED   Tetrahydrocannabinol NONE DETECTED NONE DETECTED   Barbiturates NONE DETECTED NONE DETECTED    Comment: (NOTE) DRUG SCREEN FOR MEDICAL PURPOSES ONLY.  IF CONFIRMATION IS NEEDED FOR ANY PURPOSE, NOTIFY LAB WITHIN 5 DAYS.  LOWEST DETECTABLE LIMITS FOR URINE DRUG SCREEN Drug Class                     Cutoff (ng/mL) Amphetamine and metabolites    1000 Barbiturate and metabolites    200 Benzodiazepine                 200 Opiates and metabolites        300 Cocaine and metabolites        300 THC                            50 Performed at [facility], [address], [place], [place] [address]   Strep pneumoniae urinary antigen     Status: None   Collection Time: 11/06/23  9:25 PM  Result Value Ref Range   Strep Pneumo Urinary Antigen NEGATIVE NEGATIVE    Comment: Performed at [facility], [address], [place], [place] [address]  Lactic acid, plasma     Status: Abnormal   Collection Time: 11/06/23 11:36 PM  Result Value Ref Range   Lactic Acid, Venous 2.4 (HH) 0.5 - 1.9 mmol/L    Comment: CRITICAL RESULT CALLED TO, READ BACK BY AND VERIFIED WITH C.WALKR RN [phone] [facility] Performed at [facility], [address], [place], [place] [address]   Magnesium     Status: Abnormal   Collection Time: 11/07/23  5:21 AM  Result Value Ref Range   Magnesium 1.6 (L) 1.7 - 2.4 mg/dL    Comment: Performed at [facility],  [address], [place], [place] [address]  Phosphorus     Status: None   Collection Time: 11/07/23  5:21 AM  Result Value Ref Range   Phosphorus 4.0 2.5 - 4.6 mg/dL    Comment: Performed at [facility], [address], [place], [place] [address]   CBC     Status: Abnormal   Collection Time: 11/07/23  5:21 AM  Result Value Ref Range   WBC 3.8 (L) 4.0 - 10.5 K/uL   RBC 3.27 (L) 4.22 - 5.81 MIL/uL   Hemoglobin 10.1 (L) 13.0 - 17.0 g/dL   HCT 11.9 (L) 14.7 - 82.9 %   MCV 91.7 80.0 - 100.0 fL   MCH 30.9 26.0 - 34.0 pg   MCHC 33.7 30.0 - 36.0 g/dL   RDW 56.2 13.0 - 86.5 %   Platelets 133 (L) 150 - 400 K/uL   nRBC 0.0 0.0 - 0.2 %    Comment: Performed at [facility], [address], [place], [place] [address]  Comprehensive metabolic panel     Status: Abnormal   Collection Time: 11/07/23  5:21 AM  Result Value Ref Range   Sodium 135 135 - 145 mmol/L   Potassium 4.0 3.5 - 5.1 mmol/L   Chloride 105 98 - 111 mmol/L   CO2 22 22 - 32 mmol/L   Glucose, Bld 87 70 - 99 mg/dL    Comment: Glucose reference range applies only to samples taken after fasting for at least 8 hours.   BUN 14 6 - 20 mg/dL   Creatinine, Ser 6.29 0.61 - 1.24 mg/dL   Calcium 7.7 (L) 8.9 - 10.3 mg/dL    Comment: REPEATED TO VERIFY   Total Protein 5.7 (L) 6.5 - 8.1 g/dL   Albumin 2.8 (L) 3.5 - 5.0 g/dL   AST 19 15 - 41 U/L   ALT 17 0 - 44 U/L   Alkaline Phosphatase 46 38 - 126 U/L   Total Bilirubin 0.5 <1.2 mg/dL   GFR, Estimated >52 >84 mL/min    Comment: (NOTE) Calculated using the CKD-EPI Creatinine Equation (2021)    Anion gap 8 5 - 15    Comment: Performed at [facility], [address] [place], [place] [address]  Procalcitonin     Status: None   Collection Time: 11/07/23  5:21 AM  Result Value Ref Range   Procalcitonin <0.10 ng/mL    Comment:        Interpretation: PCT (Procalcitonin) <= 0.5 ng/mL: Systemic infection (sepsis) is not likely. Local bacterial infection is possible. (NOTE)       Sepsis PCT Algorithm           Lower Respiratory Tract                                      Infection PCT Algorithm    ----------------------------     ----------------------------         PCT < 0.25 ng/mL                PCT < 0.10 ng/mL           Strongly encourage             Strongly discourage   discontinuation of antibiotics    initiation of antibiotics    ----------------------------     -----------------------------       PCT 0.25 -  0.50 ng/mL            PCT 0.10 - 0.25 ng/mL               OR       >80% decrease in PCT            Discourage initiation of                                            antibiotics      Encourage discontinuation           of antibiotics    ----------------------------     -----------------------------         PCT >= 0.50 ng/mL              PCT 0.26 - 0.50 ng/mL               AND        <80% decrease in PCT             Encourage initiation of                                             antibiotics       Encourage continuation           of antibiotics    ----------------------------     -----------------------------        PCT >= 0.50 ng/mL                  PCT > 0.50 ng/mL               AND         increase in PCT                  Strongly encourage                                      initiation of antibiotics    Strongly encourage escalation           of antibiotics                                     -----------------------------                                           PCT <= 0.25 ng/mL                                                 OR                                        > 80% decrease in PCT  Discontinue / Do not initiate                                             antibiotics  Performed at [facility], [address], [place], [place] [address]   Lactic acid, plasma     Status: None   Collection Time: 11/07/23  5:21 AM  Result Value Ref Range   Lactic Acid, Venous 0.5 0.5 - 1.9 mmol/L    Comment: Performed at [facility], [address], [place], [place] [address]   DG Chest 2 View Result Date: 11/06/2023 CLINICAL DATA:  Hypotension. EXAM: CHEST - 2 VIEW COMPARISON:  Radiograph 8 days ago 11/01/2023 FINDINGS: Significant  patient rotation. Chronic elevation of left hemidiaphragm. There is increasing patchy opacity at the left lung base. The right lung is clear. Grossly stable heart size and mediastinal contours allowing for rotation on the current exam. No pneumothorax or large pleural effusion. Normal pulmonary vasculature. IMPRESSION: 1. Increasing patchy opacity at the left lung base, suspicious for pneumonia. 2. Chronic elevation of left hemidiaphragm. Electronically Signed   By: Narda Rutherford M.D.   On: 11/06/2023 20:57    Pending Labs Unresulted Labs (From admission, onward)     Start     Ordered   11/13/23 0500  Creatinine, serum  (enoxaparin (LOVENOX)    CrCl >/= 30 ml/min)  Weekly,   R     Comments: while on enoxaparin therapy    11/06/23 2124   11/06/23 2132  Procalcitonin  Daily,   R     References:    Procalcitonin Lower Respiratory Tract Infection AND Sepsis Procalcitonin Algorithm   11/06/23 2131   11/06/23 2125  Legionella Pneumophila Serogp 1 Ur Ag  (COPD / Pneumonia / Cellulitis / Lower Extremity Wound)  Add-on,   AD        11/06/23 2124            Vitals/Pain Today's Vitals   11/07/23 0500 11/07/23 0530 11/07/23 0614 11/07/23 0615  BP: 94/67 100/74  99/82  Pulse: 71 88  74  Resp: 14 15  16   Temp:  98.5 F (36.9 C)    TempSrc:  Oral    SpO2: 98% 100%  98%  PainSc:   10-Worst pain ever     Isolation Precautions No active isolations  Medications Medications  bictegravir-emtricitabine-tenofovir AF (BIKTARVY) 50-200-25 MG per tablet 1 tablet (has no administration in time range)  lipase/protease/amylase (CREON) capsule 36,000 Units (has no administration in time range)  pantoprazole (PROTONIX) EC tablet 40 mg (has no administration in time range)  sucralfate (CARAFATE) tablet 1 g (has no administration in time range)  levETIRAcetam (KEPPRA) tablet 500 mg (500 mg Oral Given 11/06/23 2228)  thiamine (VITAMIN B1) tablet 100 mg (100 mg Oral Given 11/06/23 2228)    Or   thiamine (VITAMIN B1) injection 100 mg ( Intravenous See Alternative 11/06/23 2228)  folic acid (FOLVITE) tablet 1 mg (1 mg Oral Given 11/06/23 2228)  multivitamin with minerals tablet 1 tablet (1 tablet Oral Given 11/06/23 2228)  enoxaparin (LOVENOX) injection 40 mg (40 mg Subcutaneous Given 11/06/23 2228)  sodium chloride flush (NS) 0.9 % injection 3 mL (3 mLs Intravenous Given 11/07/23 0615)  acetaminophen (TYLENOL) tablet 650 mg (has no administration in time range)    Or  acetaminophen (TYLENOL) suppository 650 mg (has no  administration in time range)  oxyCODONE (Oxy IR/ROXICODONE) immediate release tablet 5 mg (5 mg Oral Given 11/07/23 0259)  ketorolac (TORADOL) 15 MG/ML injection 15 mg (15 mg Intravenous Given 11/07/23 0102)  methocarbamol (ROBAXIN) injection 500 mg (has no administration in time range)  polyethylene glycol (MIRALAX / GLYCOLAX) packet 17 g (has no administration in time range)  prochlorperazine (COMPAZINE) injection 5 mg (has no administration in time range)  cefTRIAXone (ROCEPHIN) 2 g in sodium chloride 0.9 % 100 mL IVPB (0 g Intravenous Stopped 11/07/23 0615)  azithromycin (ZITHROMAX) 500 mg in sodium chloride 0.9 % 250 mL IVPB (0 mg Intravenous Stopped 11/06/23 2237)  LORazepam (ATIVAN) tablet 1-4 mg (has no administration in time range)    Or  LORazepam (ATIVAN) injection 1-4 mg (has no administration in time range)  LORazepam (ATIVAN) injection 0-4 mg (1 mg Intravenous Given 11/07/23 0612)    Followed by  LORazepam (ATIVAN) injection 0-4 mg (has no administration in time range)  sodium chloride 0.9 % bolus 1,000 mL (0 mLs Intravenous Stopped 11/06/23 2237)  fentaNYL (SUBLIMAZE) injection 25 mcg (25 mcg Intravenous Given 11/06/23 2124)  sodium chloride 0.9 % bolus 1,000 mL (0 mLs Intravenous Stopped 11/06/23 2237)  Ampicillin-Sulbactam (UNASYN) 3 g in sodium chloride 0.9 % 100 mL IVPB (0 g Intravenous Stopped 11/06/23 2202)    Mobility walks with person  assist     Focused Assessments    R Recommendations: See Admitting Provider Note  Report given to:   Additional Notes:

## 2023-11-07 NOTE — Progress Notes (Signed)
patient is requesting for higher pain doses he states his pain is still 10/10. I offered the oxycodone and tylenol; he says that will not help. I also just gave him Toradol and ativan 1 mg. He received Dilauded 0.5 mg around 9:35. he states if he doesn't get his pain under control he will leave AMA. Noralee Stain DO notified and aware.

## 2023-11-07 NOTE — Progress Notes (Addendum)
PROGRESS NOTE  Gary [name]  UJW:[id number] DOB: 10-13-1986 DOA: 11/06/2023 PCP: Default, Provider, MD   Brief Narrative:  Patient is a 37 year old male with history of alcohol abuse, chronic pancreatitis, seizure disorder, HIV, noncompliance, frequent admissions who presented to the Emergency Department with complaint of generalized body ache, malaise.  He was just discharged a day ago after he was treated for acute on chronic alcoholic pancreatitis.  Patient continues to drink.  Complaint of productive cough.  On presentation, he was afebrile, blood pressure was soft but he was hemodynamically stable.  Lab work showed sodium of 131, normal white cell count, lactate of 2.9, lipase of 158.  Ethanol level is 172.  Chest x-ray was concerning for pneumonia in the left lower lobe.  Started on antibiotics.  Assessment & Plan:  Principal Problem:   Pneumonia Active Problems:   Alcohol use disorder, severe, dependence (HCC)   HIV (human immunodeficiency virus infection) (HCC)   Chronic pancreatitis (HCC)   Left lower lobe pneumonia: Presented with general malaise, body aches, productive cough.  X-ray concerning for left lower lobe pneumonia.  Could be aspiration pneumonia in the setting of alcohol consumption.  Continue ceftriaxone, azithromycin. Patient with mild lactate elevation on presentation,now resolved.  Procalcitonin reassuring.  Chronic alcohol abuse: Monitor CIWA.  Continues to drink despite history of recurrent alcoholic pancreatitis.  Drinks hard liquor.  Chronic recurrent  pancreatitis: On Creon.  Was just admitted and was discharged after being managed for acute on chronic pancreatitis.  Lipase mildly elevated.  Complains of abdominal pain.  No nausea or vomiting.  Tolerating diet  HIV: Last CD4 of 566.  Continue Biktarvy.  To follow-up with ID as an outpatient, he says he misses the doses frequently.  Counseled for compliance  Seizure disorder: Continue Keppra.  Misses doses  frequently  Mild thrombocytopenia: Likely associated with chronic alcohol abuse  Hypomagnesemia: Supplemented  Homelessness: TOC consulted        DVT prophylaxis:enoxaparin (LOVENOX) injection 40 mg Start: 11/06/23 2130     Code Status: Full Code  Family Communication: None at the bedside  Patient status:Obs  Patient is from :homeless  Anticipated discharge to: Back to previous environment  Estimated DC date:1-2 days   Consultants: None  Procedures:None  Antimicrobials:  Anti-infectives (From admission, onward)    Start     Dose/Rate Route Frequency Ordered Stop   11/07/23 1000  bictegravir-emtricitabine-tenofovir AF (BIKTARVY) 50-200-25 MG per tablet 1 tablet        1 tablet Oral Daily 11/06/23 2124     11/07/23 0500  cefTRIAXone (ROCEPHIN) 2 g in sodium chloride 0.9 % 100 mL IVPB        2 g 200 mL/hr over 30 Minutes Intravenous Every 24 hours 11/06/23 2124 11/12/23 0459   11/06/23 2130  azithromycin (ZITHROMAX) 500 mg in sodium chloride 0.9 % 250 mL IVPB        500 mg 250 mL/hr over 60 Minutes Intravenous Every 24 hours 11/06/23 2124 11/11/23 2129   11/06/23 2115  Ampicillin-Sulbactam (UNASYN) 3 g in sodium chloride 0.9 % 100 mL IVPB        3 g 200 mL/hr over 30 Minutes Intravenous  Once 11/06/23 2106 11/06/23 2202       Subjective: Patient seen and examined at bedside today.  Hemodynamically stable.  Lying in bed.  Appears comfortable.  Complains of abdominal pain.  No nausea or vomiting.  On room air.  Speaking in full sentences.  Objective: Vitals:   11/07/23 0245  11/07/23 0500 11/07/23 0530 11/07/23 0615  BP: 99/73 94/67 100/74 99/82  Pulse: 87 71 88 74  Resp: 18 14 15 16   Temp:   98.5 F (36.9 C)   TempSrc:   Oral   SpO2: 98% 98% 100% 98%    Intake/Output Summary (Last 24 hours) at 11/07/2023 0748 Last data filed at 11/07/2023 0615 Gross per 24 hour  Intake 197.75 ml  Output --  Net 197.75 ml   There were no vitals filed for this  visit.  Examination:  General exam: Overall comfortable, not in distress, thin built HEENT: PERRL Respiratory system:  no wheezes or crackles , mild diminished air sounds on the left side Cardiovascular system: S1 & S2 heard, RRR.  Gastrointestinal system: Abdomen is nondistended, soft.  Tenderness on the epigastric area.  Bowel sounds present Central nervous system: Alert and oriented Extremities: No edema, no clubbing ,no cyanosis Skin: No rashes, no ulcers,no icterus     Data Reviewed: I have personally reviewed following labs and imaging studies  CBC: Recent Labs  Lab 10/31/23 1942 11/01/23 0343 11/02/23 0431 11/03/23 0500 11/06/23 1754 11/07/23 0521  WBC 10.6* 11.9* 4.4 3.1* 6.9 3.8*  NEUTROABS 8.6*  --   --   --   --   --   HGB 12.3* 13.7 10.5* 11.3* 12.4* 10.1*  HCT 38.6* 42.0 31.9* 34.2* 37.8* 30.0*  MCV 96.3 95.5 94.4 95.0 93.1 91.7  PLT 307 372 175 151 204 133*   Basic Metabolic Panel: Recent Labs  Lab 11/01/23 0343 11/02/23 0431 11/03/23 0500 11/04/23 0509 11/05/23 0539 11/06/23 1754 11/07/23 0521  NA 141 137 137 137 133* 131* 135  K 4.1 4.0 3.7 4.2 4.0 3.6 4.0  CL 104 106 100 101 99 95* 105  CO2 21* 24 31 29 26  21* 22  GLUCOSE 126* 94 86 116* 108* 146* 87  BUN 10 5* <5* 6 10 21* 14  CREATININE 1.01 0.76 0.71 0.97 0.81 1.06 0.74  CALCIUM 7.8* 8.0* 8.9 9.0 9.2 9.9 7.7*  MG 1.6* 2.1  --  1.6* 1.6*  --  1.6*  PHOS  --  2.5  --   --   --   --  4.0     Recent Results (from the past 240 hours)  Urine Culture     Status: Abnormal   Collection Time: 10/31/23  4:40 AM   Specimen: Urine, Random  Result Value Ref Range Status   Specimen Description URINE, RANDOM  Final   Special Requests   Final    NONE Reflexed from [id number] Performed at [facility], [address] [place], [place] [address]    Culture MULTIPLE SPECIES PRESENT, SUGGEST RECOLLECTION (A)  Final   Report Status 11/02/2023 FINAL  Final     Radiology Studies: DG Chest 2  View Result Date: 11/06/2023 CLINICAL DATA:  Hypotension. EXAM: CHEST - 2 VIEW COMPARISON:  Radiograph 8 days ago 11/01/2023 FINDINGS: Significant patient rotation. Chronic elevation of left hemidiaphragm. There is increasing patchy opacity at the left lung base. The right lung is clear. Grossly stable heart size and mediastinal contours allowing for rotation on the current exam. No pneumothorax or large pleural effusion. Normal pulmonary vasculature. IMPRESSION: 1. Increasing patchy opacity at the left lung base, suspicious for pneumonia. 2. Chronic elevation of left hemidiaphragm. Electronically Signed   By: Narda Rutherford M.D.   On: 11/06/2023 20:57    Scheduled Meds:  bictegravir-emtricitabine-tenofovir AF  1 tablet Oral Daily   enoxaparin (LOVENOX)  injection  40 mg Subcutaneous Q24H   folic acid  1 mg Oral Daily   levETIRAcetam  500 mg Oral BID   lipase/protease/amylase  36,000 Units Oral TID AC   LORazepam  0-4 mg Intravenous Q6H   Followed by   Melene Muller ON 11/09/2023] LORazepam  0-4 mg Intravenous Q12H   multivitamin with minerals  1 tablet Oral Daily   pantoprazole  40 mg Oral Daily   sodium chloride flush  3 mL Intravenous Q12H   sucralfate  1 g Oral TID WC & HS   thiamine  100 mg Oral Daily   Or   thiamine  100 mg Intravenous Daily   Continuous Infusions:  azithromycin Stopped (11/06/23 2237)   cefTRIAXone (ROCEPHIN)  IV Stopped (11/07/23 0615)   magnesium sulfate bolus IVPB       LOS: 0 days   Burnadette Pop, MD [facility] P12/29/2024, 7:48 AM

## 2023-11-07 NOTE — Plan of Care (Signed)
  Problem: Education: Goal: Knowledge of General Education information will improve Description: Including pain rating scale, medication(s)/side effects and non-pharmacologic comfort measures Outcome: Progressing   Problem: Health Behavior/Discharge Planning: Goal: Ability to manage health-related needs will improve Outcome: Progressing   Problem: Clinical Measurements: Goal: Ability to maintain clinical measurements within normal limits will improve Outcome: Progressing Goal: Will remain free from infection Outcome: Progressing Goal: Diagnostic test results will improve Outcome: Progressing Goal: Respiratory complications will improve Outcome: Progressing Goal: Cardiovascular complication will be avoided Outcome: Progressing   Problem: Activity: Goal: Risk for activity intolerance will decrease Outcome: Progressing   Problem: Nutrition: Goal: Adequate nutrition will be maintained Outcome: Progressing   Problem: Coping: Goal: Level of anxiety will decrease Outcome: Progressing   Problem: Elimination: Goal: Will not experience complications related to bowel motility Outcome: Progressing Goal: Will not experience complications related to urinary retention Outcome: Progressing   Problem: Pain Management: Goal: General experience of comfort will improve Outcome: Progressing   Problem: Safety: Goal: Ability to remain free from injury will improve Outcome: Progressing   Problem: Skin Integrity: Goal: Risk for impaired skin integrity will decrease Outcome: Progressing   Problem: Activity: Goal: Ability to tolerate increased activity will improve Outcome: Progressing   Problem: Clinical Measurements: Goal: Ability to maintain a body temperature in the normal range will improve Outcome: Progressing   Problem: Respiratory: Goal: Ability to maintain adequate ventilation will improve Outcome: Progressing Goal: Ability to maintain a clear airway will improve Outcome:  Progressing

## 2023-11-08 ENCOUNTER — Other Ambulatory Visit (HOSPITAL_COMMUNITY): Payer: Self-pay

## 2023-11-08 DIAGNOSIS — K860 Alcohol-induced chronic pancreatitis: Secondary | ICD-10-CM | POA: Diagnosis not present

## 2023-11-08 DIAGNOSIS — F10920 Alcohol use, unspecified with intoxication, uncomplicated: Secondary | ICD-10-CM

## 2023-11-08 DIAGNOSIS — J189 Pneumonia, unspecified organism: Secondary | ICD-10-CM | POA: Diagnosis not present

## 2023-11-08 DIAGNOSIS — Z21 Asymptomatic human immunodeficiency virus [HIV] infection status: Secondary | ICD-10-CM | POA: Diagnosis not present

## 2023-11-08 LAB — I-STAT CG4 LACTIC ACID, ED: Lactic Acid, Venous: 2.8 mmol/L (ref 0.5–1.9)

## 2023-11-08 LAB — MAGNESIUM: Magnesium: 2 mg/dL (ref 1.7–2.4)

## 2023-11-08 MED ORDER — HYDROMORPHONE HCL 1 MG/ML IJ SOLN
0.5000 mg | Freq: Four times a day (QID) | INTRAMUSCULAR | Status: DC | PRN
  Administered 2023-11-08 – 2023-11-09 (×3): 0.5 mg via INTRAVENOUS
  Filled 2023-11-08 (×3): qty 0.5

## 2023-11-08 MED ORDER — OXYCODONE HCL 5 MG PO TABS
7.5000 mg | ORAL_TABLET | ORAL | Status: DC | PRN
  Administered 2023-11-08 – 2023-11-10 (×4): 7.5 mg via ORAL
  Filled 2023-11-08 (×4): qty 2

## 2023-11-08 NOTE — Progress Notes (Addendum)
Pt calm resting in bed watching tv  no acute distress noted  medicated for pain per order safety measures in place    handoff report given to jaritou rN at bedside

## 2023-11-08 NOTE — Plan of Care (Signed)
  Problem: Education: Goal: Knowledge of General Education information will improve Description: Including pain rating scale, medication(s)/side effects and non-pharmacologic comfort measures Outcome: Progressing   Problem: Health Behavior/Discharge Planning: Goal: Ability to manage health-related needs will improve Outcome: Progressing   Problem: Clinical Measurements: Goal: Ability to maintain clinical measurements within normal limits will improve Outcome: Progressing Goal: Will remain free from infection Outcome: Progressing Goal: Diagnostic test results will improve Outcome: Progressing Goal: Respiratory complications will improve Outcome: Progressing Goal: Cardiovascular complication will be avoided Outcome: Progressing   Problem: Activity: Goal: Risk for activity intolerance will decrease Outcome: Progressing   Problem: Nutrition: Goal: Adequate nutrition will be maintained Outcome: Progressing   Problem: Coping: Goal: Level of anxiety will decrease Outcome: Progressing   Problem: Elimination: Goal: Will not experience complications related to bowel motility Outcome: Progressing Goal: Will not experience complications related to urinary retention Outcome: Progressing   Problem: Pain Management: Goal: General experience of comfort will improve Outcome: Progressing   Problem: Safety: Goal: Ability to remain free from injury will improve Outcome: Progressing   Problem: Skin Integrity: Goal: Risk for impaired skin integrity will decrease Outcome: Progressing   Problem: Activity: Goal: Ability to tolerate increased activity will improve Outcome: Progressing   Problem: Clinical Measurements: Goal: Ability to maintain a body temperature in the normal range will improve Outcome: Progressing   Problem: Respiratory: Goal: Ability to maintain adequate ventilation will improve Outcome: Progressing Goal: Ability to maintain a clear airway will improve Outcome:  Progressing

## 2023-11-08 NOTE — Progress Notes (Signed)
[facility]  PROGRESS NOTE  Gary [name] ZOX:[id number] DOB: [birth year] DOA: 11/06/2023 PCP: Default, Provider, MD   Brief HPI:   37 year old male with history of alcohol abuse, chronic pancreatitis, seizure disorder, HIV, noncompliance, frequent admissions who presented to the Emergency Department with complaint of generalized body ache, malaise.  He was just discharged a day ago after he was treated for acute on chronic alcoholic pancreatitis.  Patient continues to drink.  Complaint of productive cough.  On presentation, he was afebrile, blood pressure was soft but he was hemodynamically stable.  Lab work showed sodium of 131, normal white cell count, lactate of 2.9, lipase of 158.  Ethanol level is 172.  Chest x-ray was concerning for pneumonia in the left lower lobe.  Started on antibiotics.     Assessment/Plan:    Left lower lobe pneumonia: Presented with general malaise, body aches, productive cough.  X-ray concerning for left lower lobe pneumonia.  Could be aspiration pneumonia in the setting of alcohol consumption.  Continue ceftriaxone, azithromycin. Patient with mild lactate elevation on presentation,now resolved.  Procalcitonin reassuring.   Chronic alcohol abuse: Monitor CIWA.  Continues to drink despite history of recurrent alcoholic pancreatitis.  Drinks hard liquor.   Chronic recurrent  pancreatitis: On Creon.  Was just admitted and was discharged after being managed for acute on chronic pancreatitis.  Lipase mildly elevated.  Complains of abdominal pain.  No nausea or vomiting.  Tolerating diet -Will increase dose of oxycodone to 7.5 mg every 4 hours as needed -Continue Dilaudid 0.5 mg IV every 6 hours as needed   HIV: Last CD4 of 566.  Continue Biktarvy.  To follow-up with ID as an outpatient, he says he misses the doses frequently.  Counseled for compliance   Seizure disorder: Continue Keppra.  Misses doses frequently.  Did not pick up Keppra from [facility]  outpatient pharmacy.  Will get Keppra from [facility] outpatient pharmacy via meds to beds before discharge.   Mild thrombocytopenia: Likely associated with chronic alcohol abuse   Hypomagnesemia: Supplemented   Homelessness: TOC consulted   Medications     bictegravir-emtricitabine-tenofovir AF  1 tablet Oral Daily   enoxaparin (LOVENOX) injection  40 mg Subcutaneous Q24H   folic acid  1 mg Oral Daily   levETIRAcetam  500 mg Oral BID   lipase/protease/amylase  36,000 Units Oral TID AC   LORazepam  0-4 mg Intravenous Q6H   Followed by   Melene Muller ON 11/09/2023] LORazepam  0-4 mg Intravenous Q12H   multivitamin with minerals  1 tablet Oral Daily   pantoprazole  40 mg Oral Daily   sodium chloride flush  3 mL Intravenous Q12H   sucralfate  1 g Oral TID WC & HS   thiamine  100 mg Oral Daily   Or   thiamine  100 mg Intravenous Daily     Data Reviewed:   CBG:  Recent Labs  Lab 11/05/23 0807 11/05/23 1214 11/05/23 1550 11/05/23 2122 11/06/23 0806  GLUCAP 102* 112* 109* 109* 93    SpO2: 99 %    Vitals:   11/07/23 2203 11/08/23 0013 11/08/23 0453 11/08/23 0820  BP: 113/73 113/69 107/66 108/75  Pulse: 63 64 65 67  Resp:   18 18  Temp:   98.1 F (36.7 C) 98.1 F (36.7 C)  TempSrc:   Oral   SpO2:   98% 99%      Data Reviewed:  Basic Metabolic Panel: Recent Labs  Lab 11/02/23 0431 11/03/23 0500 11/04/23  4696 11/05/23 0539 11/06/23 1754 11/07/23 0521 11/08/23 0408  NA 137 137 137 133* 131* 135  --   K 4.0 3.7 4.2 4.0 3.6 4.0  --   CL 106 100 101 99 95* 105  --   CO2 24 31 29 26  21* 22  --   GLUCOSE 94 86 116* 108* 146* 87  --   BUN 5* <5* 6 10 21* 14  --   CREATININE 0.76 0.71 0.97 0.81 1.06 0.74  --   CALCIUM 8.0* 8.9 9.0 9.2 9.9 7.7*  --   MG 2.1  --  1.6* 1.6*  --  1.6* 2.0  PHOS 2.5  --   --   --   --  4.0  --     CBC: Recent Labs  Lab 11/02/23 0431 11/03/23 0500 11/06/23 1754 11/07/23 0521  WBC 4.4 3.1* 6.9 3.8*  HGB 10.5* 11.3* 12.4*  10.1*  HCT 31.9* 34.2* 37.8* 30.0*  MCV 94.4 95.0 93.1 91.7  PLT 175 151 204 133*    LFT Recent Labs  Lab 11/02/23 0431 11/03/23 0500 11/04/23 0509 11/06/23 1754 11/07/23 0521  AST 28 28 23 23 19   ALT 18 21 19 19 17   ALKPHOS 52 55 53 59 46  BILITOT 1.1 0.6 0.7 0.4 0.5  PROT 5.1* 5.8* 5.9* 7.6 5.7*  ALBUMIN 2.6* 2.8* 2.7* 3.6 2.8*     Antibiotics: Anti-infectives (From admission, onward)    Start     Dose/Rate Route Frequency Ordered Stop   11/07/23 1000  bictegravir-emtricitabine-tenofovir AF (BIKTARVY) 50-200-25 MG per tablet 1 tablet        1 tablet Oral Daily 11/06/23 2124     11/07/23 0500  cefTRIAXone (ROCEPHIN) 2 g in sodium chloride 0.9 % 100 mL IVPB        2 g 200 mL/hr over 30 Minutes Intravenous Every 24 hours 11/06/23 2124 11/12/23 0459   11/06/23 2130  azithromycin (ZITHROMAX) 500 mg in sodium chloride 0.9 % 250 mL IVPB        500 mg 250 mL/hr over 60 Minutes Intravenous Every 24 hours 11/06/23 2124 11/11/23 2129   11/06/23 2115  Ampicillin-Sulbactam (UNASYN) 3 g in sodium chloride 0.9 % 100 mL IVPB        3 g 200 mL/hr over 30 Minutes Intravenous  Once 11/06/23 2106 11/06/23 2202        DVT prophylaxis: Lovenox  Code Status: Full code  Family Communication: No family present at bedside   CONSULTS    Subjective   Continues to complain of abdominal pain.  Objective    Physical Examination:   General-appears in no acute distress Heart-S1-S2, regular, no murmur auscultated Lungs-clear to auscultation bilaterally, no wheezing or crackles auscultated Abdomen-soft, positive tenderness to palpation in epigastric region Extremities-no edema in the lower extremities Neuro-alert, oriented x3, no focal deficit noted   Status is: Inpatient:         [name]   [facility] If 7PM-7AM, please contact night-coverage at [email or web address], Office  [phone]   11/08/2023, 11:43 AM  LOS: 1 day

## 2023-11-09 DIAGNOSIS — J690 Pneumonitis due to inhalation of food and vomit: Secondary | ICD-10-CM

## 2023-11-09 DIAGNOSIS — K860 Alcohol-induced chronic pancreatitis: Secondary | ICD-10-CM | POA: Diagnosis not present

## 2023-11-09 DIAGNOSIS — F1020 Alcohol dependence, uncomplicated: Secondary | ICD-10-CM | POA: Diagnosis not present

## 2023-11-09 LAB — LEGIONELLA PNEUMOPHILA SEROGP 1 UR AG: L. pneumophila Serogp 1 Ur Ag: NEGATIVE

## 2023-11-09 MED ORDER — HYDROXYZINE HCL 25 MG PO TABS
25.0000 mg | ORAL_TABLET | Freq: Three times a day (TID) | ORAL | Status: DC | PRN
  Administered 2023-11-09 – 2023-11-10 (×2): 25 mg via ORAL
  Filled 2023-11-09 (×2): qty 1

## 2023-11-09 NOTE — Plan of Care (Signed)
  Problem: Education: Goal: Knowledge of General Education information will improve Description: Including pain rating scale, medication(s)/side effects and non-pharmacologic comfort measures Outcome: Progressing   Problem: Health Behavior/Discharge Planning: Goal: Ability to manage health-related needs will improve Outcome: Progressing   Problem: Clinical Measurements: Goal: Ability to maintain clinical measurements within normal limits will improve Outcome: Progressing Goal: Will remain free from infection Outcome: Progressing Goal: Diagnostic test results will improve Outcome: Progressing Goal: Respiratory complications will improve Outcome: Progressing Goal: Cardiovascular complication will be avoided Outcome: Progressing   Problem: Activity: Goal: Risk for activity intolerance will decrease Outcome: Progressing   Problem: Nutrition: Goal: Adequate nutrition will be maintained Outcome: Progressing   Problem: Coping: Goal: Level of anxiety will decrease Outcome: Progressing   Problem: Elimination: Goal: Will not experience complications related to bowel motility Outcome: Progressing Goal: Will not experience complications related to urinary retention Outcome: Progressing   Problem: Pain Management: Goal: General experience of comfort will improve Outcome: Progressing   Problem: Safety: Goal: Ability to remain free from injury will improve Outcome: Progressing   Problem: Skin Integrity: Goal: Risk for impaired skin integrity will decrease Outcome: Progressing   Problem: Activity: Goal: Ability to tolerate increased activity will improve Outcome: Progressing   Problem: Clinical Measurements: Goal: Ability to maintain a body temperature in the normal range will improve Outcome: Progressing   Problem: Respiratory: Goal: Ability to maintain adequate ventilation will improve Outcome: Progressing Goal: Ability to maintain a clear airway will improve Outcome:  Progressing

## 2023-11-09 NOTE — Progress Notes (Addendum)
Pt calm resting in bed , pt anxious at this time  has been medicated per order    room door open per pt request  call bell within reach  safety measures in place  handoff report completed at bedside with tameka Rn

## 2023-11-09 NOTE — Progress Notes (Addendum)
 [facility]  PROGRESS NOTE  CORDON GASSETT FMW:[id number] DOB: 06-27-1986 DOA: 11/06/2023 PCP: Default, Provider, MD   Brief HPI:   37 year old male with history of alcohol  abuse, chronic pancreatitis, seizure disorder, HIV, noncompliance, frequent admissions who presented to the Emergency Department with complaint of generalized body ache, malaise.  He was just discharged a day ago after he was treated for acute on chronic alcoholic pancreatitis.  Patient continues to drink.  Complaint of productive cough.  On presentation, he was afebrile, blood pressure was soft but he was hemodynamically stable.  Lab work showed sodium of 131, normal white cell count, lactate of 2.9, lipase of 158.  Ethanol level is 172.  Chest x-ray was concerning for pneumonia in the left lower lobe.  Started on antibiotics.     Assessment/Plan:    Left lower lobe pneumonia: Presented with general malaise, body aches, productive cough.  X-ray concerning for left lower lobe pneumonia.  Could be aspiration pneumonia in the setting of alcohol  consumption.  Continue ceftriaxone , azithromycin . Patient with mild lactate elevation on presentation,now resolved.  Procalcitonin reassuring.   Chronic alcohol  abuse: Monitor CIWA.  Continues to drink despite history of recurrent alcoholic pancreatitis.  Drinks hard liquor.   Chronic recurrent  pancreatitis: On Creon .  Was just admitted and was discharged after being managed for acute on chronic pancreatitis.  Lipase mildly elevated.  Complains of abdominal pain.  No nausea or vomiting.  Tolerating diet -Continue oxycodone  to 7.5 mg every 4 hours as needed -Continue Dilaudid  0.5 mg IV every 6 hours as needed   HIV: Last CD4 of 566.  Continue Biktarvy .  To follow-up with ID as an outpatient, he says he misses the doses frequently.  Counseled for compliance   Seizure disorder: Continue Keppra .  Misses doses frequently.  Did not pick up Keppra  from [facility] outpatient pharmacy.   Will get Keppra  from [facility] outpatient pharmacy via meds to beds before discharge.   Mild thrombocytopenia: Likely associated with chronic alcohol  abuse   Hypomagnesemia: Supplemented   Homelessness: TOC consulted   Medications     bictegravir-emtricitabine -tenofovir  AF  1 tablet Oral Daily   enoxaparin  (LOVENOX ) injection  40 mg Subcutaneous Q24H   folic acid   1 mg Oral Daily   levETIRAcetam   500 mg Oral BID   lipase/protease/amylase  36,000 Units Oral TID AC   multivitamin with minerals  1 tablet Oral Daily   pantoprazole   40 mg Oral Daily   sodium chloride  flush  3 mL Intravenous Q12H   sucralfate   1 g Oral TID WC & HS   thiamine   100 mg Oral Daily   Or   thiamine   100 mg Intravenous Daily     Data Reviewed:   CBG:  Recent Labs  Lab 11/05/23 0807 11/05/23 1214 11/05/23 1550 11/05/23 2122 11/06/23 0806  GLUCAP 102* 112* 109* 109* 93    SpO2: 100 %    Vitals:   11/08/23 2052 11/09/23 0000 11/09/23 0458 11/09/23 1244  BP: 117/88 108/68 111/85 118/85  Pulse: 70 (!) 59 71 71  Resp: 17  17 16   Temp: 98.9 F (37.2 C)  98.8 F (37.1 C) 98.3 F (36.8 C)  TempSrc:    Oral  SpO2: 100%  97% 100%      Data Reviewed:  Basic Metabolic Panel: Recent Labs  Lab 11/03/23 0500 11/04/23 0509 11/05/23 0539 11/06/23 1754 11/07/23 0521 11/08/23 0408  NA 137 137 133* 131* 135  --   K 3.7 4.2 4.0  3.6 4.0  --   CL 100 101 99 95* 105  --   CO2 31 29 26  21* 22  --   GLUCOSE 86 116* 108* 146* 87  --   BUN <5* 6 10 21* 14  --   CREATININE 0.71 0.97 0.81 1.06 0.74  --   CALCIUM  8.9 9.0 9.2 9.9 7.7*  --   MG  --  1.6* 1.6*  --  1.6* 2.0  PHOS  --   --   --   --  4.0  --     CBC: Recent Labs  Lab 11/03/23 0500 11/06/23 1754 11/07/23 0521  WBC 3.1* 6.9 3.8*  HGB 11.3* 12.4* 10.1*  HCT 34.2* 37.8* 30.0*  MCV 95.0 93.1 91.7  PLT 151 204 133*    LFT Recent Labs  Lab 11/03/23 0500 11/04/23 0509 11/06/23 1754 11/07/23 0521  AST 28 23 23 19   ALT  21 19 19 17   ALKPHOS 55 53 59 46  BILITOT 0.6 0.7 0.4 0.5  PROT 5.8* 5.9* 7.6 5.7*  ALBUMIN 2.8* 2.7* 3.6 2.8*     Antibiotics: Anti-infectives (From admission, onward)    Start     Dose/Rate Route Frequency Ordered Stop   11/07/23 1000  bictegravir-emtricitabine -tenofovir  AF (BIKTARVY ) 50-200-25 MG per tablet 1 tablet        1 tablet Oral Daily 11/06/23 2124     11/07/23 0500  cefTRIAXone  (ROCEPHIN ) 2 g in sodium chloride  0.9 % 100 mL IVPB        2 g 200 mL/hr over 30 Minutes Intravenous Every 24 hours 11/06/23 2124 11/12/23 0459   11/06/23 2130  azithromycin  (ZITHROMAX ) 500 mg in sodium chloride  0.9 % 250 mL IVPB        500 mg 250 mL/hr over 60 Minutes Intravenous Every 24 hours 11/06/23 2124 11/11/23 2129   11/06/23 2115  Ampicillin -Sulbactam (UNASYN ) 3 g in sodium chloride  0.9 % 100 mL IVPB        3 g 200 mL/hr over 30 Minutes Intravenous  Once 11/06/23 2106 11/06/23 2202        DVT prophylaxis: Lovenox   Code Status: Full code  Family Communication: No family present at bedside   CONSULTS    Subjective   Patient seen and examined, still complains of abdominal pain.  Denies nausea and vomiting.  Objective    Physical Examination:   General-appears in no acute distress Heart-S1-S2, regular, no murmur auscultated Lungs-clear to auscultation bilaterally, no wheezing or crackles auscultated Abdomen-soft, nontender, no organomegaly Extremities-no edema in the lower extremities Neuro-alert, oriented x3, no focal deficit noted   Status is: Inpatient:         [name]   [facility] If 7PM-7AM, please contact night-coverage at [email or web address], Office  [phone]   11/09/2023, 3:35 PM  LOS: 2 days

## 2023-11-10 ENCOUNTER — Inpatient Hospital Stay (HOSPITAL_COMMUNITY)
Admission: EM | Admit: 2023-11-10 | Discharge: 2023-11-16 | DRG: 439 | Disposition: A | Discharge status: Home / Self Care | Payer: MEDICAID | Attending: Internal Medicine | Admitting: Internal Medicine

## 2023-11-10 DIAGNOSIS — F1010 Alcohol abuse, uncomplicated: Secondary | ICD-10-CM | POA: Diagnosis present

## 2023-11-10 DIAGNOSIS — Z811 Family history of alcohol abuse and dependence: Secondary | ICD-10-CM

## 2023-11-10 DIAGNOSIS — Z21 Asymptomatic human immunodeficiency virus [HIV] infection status: Secondary | ICD-10-CM | POA: Diagnosis present

## 2023-11-10 DIAGNOSIS — D72819 Decreased white blood cell count, unspecified: Secondary | ICD-10-CM | POA: Diagnosis present

## 2023-11-10 DIAGNOSIS — R001 Bradycardia, unspecified: Secondary | ICD-10-CM | POA: Diagnosis present

## 2023-11-10 DIAGNOSIS — Z91199 Patient's noncompliance with other medical treatment and regimen due to unspecified reason: Secondary | ICD-10-CM

## 2023-11-10 DIAGNOSIS — F1910 Other psychoactive substance abuse, uncomplicated: Secondary | ICD-10-CM | POA: Diagnosis present

## 2023-11-10 DIAGNOSIS — Z91148 Patient's other noncompliance with medication regimen for other reason: Secondary | ICD-10-CM

## 2023-11-10 DIAGNOSIS — F4310 Post-traumatic stress disorder, unspecified: Secondary | ICD-10-CM | POA: Diagnosis present

## 2023-11-10 DIAGNOSIS — K8681 Exocrine pancreatic insufficiency: Secondary | ICD-10-CM | POA: Diagnosis present

## 2023-11-10 DIAGNOSIS — J189 Pneumonia, unspecified organism: Secondary | ICD-10-CM | POA: Diagnosis not present

## 2023-11-10 DIAGNOSIS — Z5900 Homelessness unspecified: Secondary | ICD-10-CM

## 2023-11-10 DIAGNOSIS — F17210 Nicotine dependence, cigarettes, uncomplicated: Secondary | ICD-10-CM | POA: Diagnosis present

## 2023-11-10 DIAGNOSIS — F419 Anxiety disorder, unspecified: Secondary | ICD-10-CM | POA: Diagnosis present

## 2023-11-10 DIAGNOSIS — D649 Anemia, unspecified: Secondary | ICD-10-CM | POA: Diagnosis present

## 2023-11-10 DIAGNOSIS — F209 Schizophrenia, unspecified: Secondary | ICD-10-CM | POA: Diagnosis present

## 2023-11-10 DIAGNOSIS — Z765 Malingerer [conscious simulation]: Secondary | ICD-10-CM

## 2023-11-10 DIAGNOSIS — F3181 Bipolar II disorder: Secondary | ICD-10-CM | POA: Diagnosis present

## 2023-11-10 DIAGNOSIS — F10920 Alcohol use, unspecified with intoxication, uncomplicated: Secondary | ICD-10-CM | POA: Diagnosis not present

## 2023-11-10 DIAGNOSIS — K852 Alcohol induced acute pancreatitis without necrosis or infection: Secondary | ICD-10-CM

## 2023-11-10 DIAGNOSIS — Z888 Allergy status to other drugs, medicaments and biological substances status: Secondary | ICD-10-CM

## 2023-11-10 DIAGNOSIS — Z7141 Alcohol abuse counseling and surveillance of alcoholic: Secondary | ICD-10-CM

## 2023-11-10 DIAGNOSIS — G40909 Epilepsy, unspecified, not intractable, without status epilepticus: Secondary | ICD-10-CM | POA: Diagnosis present

## 2023-11-10 DIAGNOSIS — E876 Hypokalemia: Secondary | ICD-10-CM | POA: Diagnosis present

## 2023-11-10 DIAGNOSIS — K860 Alcohol-induced chronic pancreatitis: Principal | ICD-10-CM | POA: Diagnosis present

## 2023-11-10 DIAGNOSIS — Z79899 Other long term (current) drug therapy: Secondary | ICD-10-CM

## 2023-11-10 LAB — CBC
HCT: 35 % — ABNORMAL LOW (ref 39.0–52.0)
Hemoglobin: 11.9 g/dL — ABNORMAL LOW (ref 13.0–17.0)
MCH: 31.3 pg (ref 26.0–34.0)
MCHC: 34 g/dL (ref 30.0–36.0)
MCV: 92.1 fL (ref 80.0–100.0)
Platelets: 225 10*3/uL (ref 150–400)
RBC: 3.8 MIL/uL — ABNORMAL LOW (ref 4.22–5.81)
RDW: 15.9 % — ABNORMAL HIGH (ref 11.5–15.5)
WBC: 4.4 10*3/uL (ref 4.0–10.5)
nRBC: 0 % (ref 0.0–0.2)

## 2023-11-10 MED ORDER — PANTOPRAZOLE SODIUM 40 MG PO TBEC: 40.0000 mg | DELAYED_RELEASE_TABLET | Freq: Every day | ORAL | 1 refills | Status: DC

## 2023-11-10 MED ORDER — OXYCODONE HCL 5 MG PO TABS: 5.0000 mg | ORAL_TABLET | Freq: Four times a day (QID) | ORAL | 0 refills | Status: DC | PRN

## 2023-11-10 MED ORDER — VITAMIN B-1 100 MG PO TABS: 100.0000 mg | ORAL_TABLET | Freq: Every day | ORAL | 1 refills | Status: DC

## 2023-11-10 MED ORDER — FOLIC ACID 1 MG PO TABS: 1.0000 mg | ORAL_TABLET | Freq: Every day | ORAL | 1 refills | Status: DC

## 2023-11-10 MED ORDER — PANCRELIPASE (LIP-PROT-AMYL) 36000-114000 UNITS PO CPEP: 36000.0000 [IU] | ORAL_CAPSULE | Freq: Three times a day (TID) | ORAL | 1 refills | Status: DC

## 2023-11-10 MED ORDER — BICTEGRAVIR-EMTRICITAB-TENOFOV 50-200-25 MG PO TABS: 1.0000 | ORAL_TABLET | Freq: Every day | ORAL | 2 refills | Status: DC

## 2023-11-10 NOTE — ED Triage Notes (Signed)
 Pt states " Its my pancreatitis" , pt c/o abd pain x today, pt reports ETOH use earlier today. Pt reports he's homeless.

## 2023-11-10 NOTE — Discharge Summary (Addendum)
 Physician Discharge Summary   Patient: Gary [name] MRN: [id number] DOB: 1986/04/28  Admit date:     11/06/2023  Discharge date: 11/10/23  Discharge Physician: [name]   PCP: Default, Provider, MD   Recommendations at discharge:   Follow-up PCP as outpatient   Discharge Diagnoses: Principal Problem:   Pneumonia Active Problems:   Alcohol  use disorder, severe, dependence (HCC)   HIV (human immunodeficiency virus infection) (HCC)   Chronic pancreatitis (HCC)  Resolved Problems:   * No resolved hospital problems. *  Hospital Course: 38 year old male with history of alcohol  abuse, chronic pancreatitis, seizure disorder, HIV, noncompliance, frequent admissions who presented to the Emergency Department with complaint of generalized body ache, malaise. He was just discharged a day ago after he was treated for acute on chronic alcoholic pancreatitis. Patient continues to drink. Complaint of productive cough. On presentation, he was afebrile, blood pressure was soft but he was hemodynamically stable. Lab work showed sodium of 131, normal white cell count, lactate of 2.9, lipase of 158. Ethanol level is 172. Chest x-ray was concerning for pneumonia in the left lower lobe. Started on antibiotics.   Assessment and Plan:  Left lower lobe pneumonia: Presented with general malaise, body aches, productive cough.  X-ray concerning for left lower lobe pneumonia.  Could be aspiration pneumonia in the setting of alcohol  consumption.  Continue ceftriaxone , azithromycin . Patient with mild lactate elevation on presentation,now resolved.  Procalcitonin reassuring. -Completed 5 days of antibiotics in the hospital.  Will discontinue antibiotics.   Chronic alcohol  abuse:   Continues to drink despite history of recurrent alcoholic pancreatitis.  Drinks hard liquor. -Wants to quit drinking alcohol , resources provided by the social work for outpatient rehab.   Chronic recurrent  pancreatitis: On  Creon .  Was just admitted and was discharged after being managed for acute on chronic pancreatitis.  Lipase mildly elevated.  Abdominal pain has improved..  No nausea or vomiting.  Tolerating diet -Will discharge on Creon , as needed oxycodone    HIV: Last CD4 of 566.  Continue Biktarvy .  To follow-up with ID as an outpatient, he says he misses the doses frequently.  Counseled for compliance.   Seizure disorder: Continue Keppra .  Misses doses frequently.  Did not pick up Keppra  from [facility] outpatient pharmacy.  Keppra  was delivered to him via meds to beds from [facility] outpatient pharmacy.   -Continue Keppra  as prescribed   Mild thrombocytopenia: Likely associated with chronic alcohol  abuse   Hypomagnesemia: Supplemented   Homelessness: TOC provided resources.         Consultants:  Procedures performed:  Disposition: Home Diet recommendation:  Discharge Diet Orders (From admission, onward)     Start     Ordered   11/10/23 0000  Diet - low sodium heart healthy        11/10/23 1438           Regular diet DISCHARGE MEDICATION: Allergies as of 11/10/2023       Reactions   Tegretol  [carbamazepine ] Other (See Comments)   Vertigo   Caffeine Palpitations        Medication List     STOP taking these medications    sucralfate  1 g tablet Commonly known as: Carafate    thiamine  100 MG tablet Commonly known as: VITAMIN B1       TAKE these medications    bictegravir-emtricitabine -tenofovir  AF 50-200-25 MG Tabs tablet Commonly known as: BIKTARVY  Take 1 tablet by mouth daily.   folic acid  1 MG tablet Commonly  known as: FOLVITE  Take 1 tablet (1 mg total) by mouth daily. Start taking on: November 11, 2023   levETIRAcetam  500 MG tablet Commonly known as: KEPPRA  Take 1 tablet (500 mg total) by mouth 2 (two) times daily.   lipase/protease/amylase 63999 UNITS Cpep capsule Commonly known as: CREON  Take 1 capsule (36,000 Units total) by mouth 3 (three) times daily  before meals.   oxyCODONE  5 MG immediate release tablet Commonly known as: Oxy IR/ROXICODONE  Take 1 tablet (5 mg total) by mouth every 6 (six) hours as needed for moderate pain (pain score 4-6).   pantoprazole  40 MG tablet Commonly known as: Protonix  Take 1 tablet (40 mg total) by mouth daily.   thiamine  100 MG tablet Commonly known as: VITAMIN B1 Take 1 tablet (100 mg total) by mouth daily. Start taking on: November 11, 2023        Discharge Exam: There were no vitals filed for this visit. General-appears in no acute distress Heart-S1-S2, regular, no murmur auscultated Lungs-clear to auscultation bilaterally, no wheezing or crackles auscultated Abdomen-soft, nontender, no organomegaly Extremities-no edema in the lower extremities Neuro-alert, oriented x3, no focal deficit noted  Condition at discharge: good  The results of significant diagnostics from this hospitalization (including imaging, microbiology, ancillary and laboratory) are listed below for reference.   Imaging Studies: DG Chest 2 View Result Date: 11/06/2023 CLINICAL DATA:  Hypotension. EXAM: CHEST - 2 VIEW COMPARISON:  Radiograph 8 days ago 11/01/2023 FINDINGS: Significant patient rotation. Chronic elevation of left hemidiaphragm. There is increasing patchy opacity at the left lung base. The right lung is clear. Grossly stable heart size and mediastinal contours allowing for rotation on the current exam. No pneumothorax or large pleural effusion. Normal pulmonary vasculature. IMPRESSION: 1. Increasing patchy opacity at the left lung base, suspicious for pneumonia. 2. Chronic elevation of left hemidiaphragm. Electronically Signed   By: Andrea Gasman M.D.   On: 11/06/2023 20:57   Acute Abdominal Series Result Date: 11/01/2023 CLINICAL DATA:  Nausea EXAM: DG ABDOMEN ACUTE WITH 1 VIEW CHEST COMPARISON:  09/30/2023 FINDINGS: Elevated left hemidiaphragm. Significant gaseous distention of the stomach. No confluent  airspace opacities or effusions. Heart mediastinal contours within normal limits. No acute bony abnormality. IMPRESSION: No acute cardiopulmonary disease. Elevation of the left hemidiaphragm with significant gaseous distention of the stomach. Electronically Signed   By: Franky Crease M.D.   On: 11/01/2023 01:12   CT ABDOMEN PELVIS W CONTRAST Result Date: 10/29/2023 CLINICAL DATA:  Abdominal pain, emesis recent admission for alcohol  withdrawal and alcohol  pancreatitis. Drank alcohol  after leaving hospital. Pain is consistent with prior pancreatitis EXAM: CT ABDOMEN AND PELVIS WITH CONTRAST TECHNIQUE: Multidetector CT imaging of the abdomen and pelvis was performed using the standard protocol following bolus administration of intravenous contrast. RADIATION DOSE REDUCTION: This exam was performed according to the departmental dose-optimization program which includes automated exposure control, adjustment of the mA and/or kV according to patient size and/or use of iterative reconstruction technique. CONTRAST:  OMNIPAQUE  IOHEXOL  300 MG/ML  SOLN COMPARISON:  10/16/2023 FINDINGS: Lower chest: No acute abnormality. Hepatobiliary: Hepatic steatosis. Gallbladder and biliary tree are unremarkable. Pancreas: Pancreatic atrophy. Hazy fat stranding and fluid about the pancreatic head and uncinate process. 2.1 cm pseudocyst in the uncinate process is similar to prior and 1.4 cm pseudocyst in the pancreatic head has slightly decreased in size. Areas of poor enhancement in the pancreatic head compatible with necrosis. No ductal dilation. The overall extent of pancreatitis has slightly improved compared to 10/16/2023. Spleen: Unremarkable.  Adrenals/Urinary Tract: Normal adrenal glands. No urinary calculi or hydronephrosis. Diffuse bladder wall thickening. Stomach/Bowel: No bowel obstruction or bowel wall thickening. Distended stomach. Mild wall thickening about the duodenum likely reactive secondary to pancreatitis. Normal  appendix. 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 No lymphadenopathy. Reproductive: Unremarkable. Other: No free intraperitoneal air. Musculoskeletal: No acute fracture. IMPRESSION: 1. Slightly improved severe pancreatitis compared to 10/16/2023. 2. 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 3. Hepatic steatosis. 4. Diffuse bladder wall thickening. Correlate with urinalysis to exclude cystitis. Electronically Signed   By: Norman Gatlin M.D.   On: 10/29/2023 00:03   CT Angio Abd/Pel W and/or Wo Contrast Result Date: 10/16/2023 CLINICAL DATA:  38 year old male with recurrent acute pancreatitis, possible SMV thrombus on CT 10/10/2023. EXAM: CTA ABDOMEN AND PELVIS WITHOUT AND WITH CONTRAST TECHNIQUE: Multidetector CT imaging of the abdomen and pelvis was performed using the standard protocol during bolus administration of intravenous contrast. Multiplanar reconstructed images and MIPs were obtained and reviewed to evaluate the vascular anatomy. RADIATION DOSE REDUCTION: This exam was performed according to the departmental dose-optimization program which includes automated exposure control, adjustment of the mA and/or kV according to patient size and/or use of iterative reconstruction technique. CONTRAST:  75mL OMNIPAQUE  IOHEXOL  350 MG/ML SOLN COMPARISON:  CT Abdomen and Pelvis 10/10/2023 and earlier. FINDINGS: VASCULAR Initial noncontrast images demonstrate minimal left iliac artery calcified plaque and normal caliber of the abdominal aorta. Following contrast the abdominal aorta is patent and normal. The major aortic branches are patent including the celiac and S in the. There is mild ectasia of the left Common iliac artery. Bilateral iliac and proximal femoral arteries otherwise appear patent and normal. On the delayed portal venous phase images there is mass effect and moderate to severe attenuation of the confluence of the splenic vein, SMV, and main portal vein on series 14, image 28. But  those structures remain patent. Tiny nonocclusive SMV thrombus redemonstrated on series 14, image 32, but elsewhere the major mesenteric venous structures appear to be patent. Review of the MIP images confirms the above findings. NON-VASCULAR Lower chest: Heart size remains normal. No pericardial effusion. Elevated left hemidiaphragm is chronic. Trace layering pleural effusions are more apparent. Chronic but increased left lower lobe atelectasis. Mild right lung base atelectasis. Hepatobiliary: Liver enhancement is maintained with evidence of hepatic steatosis. Gallbladder appears negative. No significant bile duct enlargement. Pancreas: Severe pancreatitis redemonstrated. Phlegmon like heterogeneous enlargement of the pancreatic head, uncinate, proximal body. Areas of heterogeneous pancreatic necrosis and progressive cystic spaces within the affected parenchyma individually up to 14-17 mm (series 14, image 31) compatible with developing pseudocysts. Inflammation tracks into the root of the small bowel mesentery. Comparatively small volume of regional free fluid. Secondary inflammation of the stomach and duodenum. Spleen: Negative. Adrenals/Urinary Tract: Negative. Symmetric renal enhancement and contrast excretion. Excreted contrast in the urinary bladder on the 0500 hour repeat images. Stomach/Bowel: Oral contrast mixed with stool in the descending colon. Redundant splenic flexure. Decompressed transverse colon. Normal contrast containing appendix. Indistinct appearance of abdominal small bowel loops, although less dilated since 10/10/2023. Inflammation emitting from the lesser sac. No pneumoperitoneum identified. Lymphatic: No lymphadenopathy identified. Reproductive: Stable. Other: Small volume of free fluid in the pelvis with simple fluid density. Musculoskeletal: No acute osseous abnormality identified. Not present at 0218 hours, but present on repeat images at 0522 hours is patchy contrast and/or enhancement  in the left lower lateral chest wall from the lower pectoralis muscle tracking laterally on series 12, image 1. This  persists on the delayed portal venous phase images. Underlying ribs appear intact. Furthermore, there is contrast extravasation into the adjacent left upper extremity visible there. These are poorly visible on the scout view. However, on series 21 coronal images the upper extremity extravasation is visible with associated soft tissue edema. IMPRESSION: 1. Ongoing Severe Pancreatitis. Heterogeneous pancreatic phlegmon, necrosis with developing small pseudocysts in the head, uncinate, and proximal body (currently up to 1.7 cm). 2. Regional inflammation, with mass effect on the confluence of the major portal venous structures. The portal venous system does remain patent, although there is again evidence of a small nonocclusive thrombus there at the SMV. 3. Significant Contrast Extravasation into the Left upper extremity, and also visible at the left lateral chest wall (contrast probably migrated into the chest from a ruptured regional vein such as in near the axilla). Recommend cold compresses, elevation of the left extremity, physical exam checks, and plastic surgery consultation if any skin changes are observed. 4. Small volume of free fluid in the abdomen and pelvis, trace pleural effusions. Lung base atelectasis. Electronically Signed   By: VEAR Hurst M.D.   On: 10/16/2023 06:45    Microbiology: Results for orders placed or performed during the hospital encounter of 10/31/23  Urine Culture     Status: Abnormal   Collection Time: 10/31/23  4:40 AM   Specimen: Urine, Random  Result Value Ref Range Status   Specimen Description URINE, RANDOM  Final   Special Requests   Final    NONE Reflexed from [id number] Performed at [facility], [address], [place], [place] [address]    Culture MULTIPLE SPECIES PRESENT, SUGGEST RECOLLECTION (A)  Final   Report Status 11/02/2023 FINAL  Final     Labs: CBC: Recent Labs  Lab 11/06/23 1754 11/07/23 0521  WBC 6.9 3.8*  HGB 12.4* 10.1*  HCT 37.8* 30.0*  MCV 93.1 91.7  PLT 204 133*   Basic Metabolic Panel: Recent Labs  Lab 11/04/23 0509 11/05/23 0539 11/06/23 1754 11/07/23 0521 11/08/23 0408  NA 137 133* 131* 135  --   K 4.2 4.0 3.6 4.0  --   CL 101 99 95* 105  --   CO2 29 26 21* 22  --   GLUCOSE 116* 108* 146* 87  --   BUN 6 10 21* 14  --   CREATININE 0.97 0.81 1.06 0.74  --   CALCIUM  9.0 9.2 9.9 7.7*  --   MG 1.6* 1.6*  --  1.6* 2.0  PHOS  --   --   --  4.0  --    Liver Function Tests: Recent Labs  Lab 11/04/23 0509 11/06/23 1754 11/07/23 0521  AST 23 23 19   ALT 19 19 17   ALKPHOS 53 59 46  BILITOT 0.7 0.4 0.5  PROT 5.9* 7.6 5.7*  ALBUMIN 2.7* 3.6 2.8*   CBG: Recent Labs  Lab 11/05/23 0807 11/05/23 1214 11/05/23 1550 11/05/23 2122 11/06/23 0806  GLUCAP 102* 112* 109* 109* 93    Discharge time spent: greater than 30 minutes.  Signed: [name], MD [facility] 11/10/2023

## 2023-11-10 NOTE — Plan of Care (Signed)
  Problem: Clinical Measurements: Goal: Diagnostic test results will improve Outcome: Progressing Goal: Respiratory complications will improve Outcome: Progressing Goal: Cardiovascular complication will be avoided Outcome: Progressing   Problem: Coping: Goal: Level of anxiety will decrease Outcome: Progressing   Problem: Pain Management: Goal: General experience of comfort will improve Outcome: Progressing   Problem: Safety: Goal: Ability to remain free from injury will improve Outcome: Progressing

## 2023-11-10 NOTE — Plan of Care (Signed)
  Problem: Education: Goal: Knowledge of General Education information will improve Description: Including pain rating scale, medication(s)/side effects and non-pharmacologic comfort measures Outcome: Adequate for Discharge   Problem: Health Behavior/Discharge Planning: Goal: Ability to manage health-related needs will improve Outcome: Adequate for Discharge   Problem: Clinical Measurements: Goal: Ability to maintain clinical measurements within normal limits will improve Outcome: Adequate for Discharge Goal: Will remain free from infection Outcome: Adequate for Discharge Goal: Diagnostic test results will improve Outcome: Adequate for Discharge Goal: Respiratory complications will improve Outcome: Adequate for Discharge Goal: Cardiovascular complication will be avoided Outcome: Adequate for Discharge   Problem: Activity: Goal: Risk for activity intolerance will decrease Outcome: Adequate for Discharge   Problem: Nutrition: Goal: Adequate nutrition will be maintained Outcome: Adequate for Discharge   Problem: Coping: Goal: Level of anxiety will decrease Outcome: Adequate for Discharge   Problem: Elimination: Goal: Will not experience complications related to bowel motility Outcome: Adequate for Discharge Goal: Will not experience complications related to urinary retention Outcome: Adequate for Discharge   Problem: Pain Management: Goal: General experience of comfort will improve Outcome: Adequate for Discharge   Problem: Safety: Goal: Ability to remain free from injury will improve Outcome: Adequate for Discharge   Problem: Skin Integrity: Goal: Risk for impaired skin integrity will decrease Outcome: Adequate for Discharge   Problem: Activity: Goal: Ability to tolerate increased activity will improve Outcome: Adequate for Discharge   Problem: Clinical Measurements: Goal: Ability to maintain a body temperature in the normal range will improve Outcome: Adequate  for Discharge   Problem: Respiratory: Goal: Ability to maintain adequate ventilation will improve Outcome: Adequate for Discharge Goal: Ability to maintain a clear airway will improve Outcome: Adequate for Discharge

## 2023-11-10 NOTE — TOC Transition Note (Addendum)
 Transition of Care [facility]) - Discharge Note   Patient Details  Name: CHUCKY HOMES MRN: [id number] Date of Birth: [birth year]  [facility] CM/SW Contact:  Bascom Service, RN Phone Number: 11/10/2023, 1:12 PM   Clinical Narrative:  Substance use resources added to d/c instructions, also shelter resources.TOC prior note on 1/27-doesn't qualify for 60day detox program with medicaid. Will provide bus pass if needed-Nsg to advise. No further CM needs.  -3:48p-has health insurance w/script coverage-has an obligated c pay for meds. Bus pass provided. No further CM needs.    Final next level of care: Homeless Shelter Barriers to Discharge: No Barriers Identified   Patient Goals and CMS Choice            Discharge Placement                       Discharge Plan and Services Additional resources added to the After Visit Summary for                                       Social Drivers of Health (SDOH) Interventions SDOH Screenings   Food Insecurity: Food Insecurity Present (11/07/2023)  Housing: High Risk (11/07/2023)  Transportation Needs: Unmet Transportation Needs (11/07/2023)  Utilities: At Risk (11/07/2023)  Alcohol  Screen: High Risk (07/25/2021)  Depression (PHQ2-9): Medium Risk (02/12/2021)  Financial Resource Strain: Patient Declined (10/11/2023)  Social Connections: Unknown (06/24/2022)   Received from [facility], [facility]  Stress: No Stress Concern Present (06/30/2022)   Received from [facility], [facility]  Recent Concern: Stress - Stress Concern Present (06/25/2022)   Received from [facility]  Tobacco Use: High Risk (11/06/2023)     Readmission Risk Interventions    11/04/2023    2:28 PM 10/25/2023    1:58 PM 10/11/2023   12:26 PM  Readmission Risk Prevention Plan  Transportation Screening Complete Complete Complete  Medication Review (RN Care Manager) Complete Complete Referral to Pharmacy  PCP or Specialist  appointment within 3-5 days of discharge Patient refused  Patient refused  HRI or Home Care Consult Complete Complete Patient refused  SW Recovery Care/Counseling Consult Complete Complete Complete  Palliative Care Screening Not Applicable Not Applicable Not Applicable  Skilled Nursing Facility Not Applicable Not Applicable Not Applicable

## 2023-11-11 ENCOUNTER — Other Ambulatory Visit: Payer: Self-pay

## 2023-11-11 DIAGNOSIS — D649 Anemia, unspecified: Secondary | ICD-10-CM | POA: Diagnosis present

## 2023-11-11 DIAGNOSIS — K852 Alcohol induced acute pancreatitis without necrosis or infection: Secondary | ICD-10-CM

## 2023-11-11 DIAGNOSIS — Z79899 Other long term (current) drug therapy: Secondary | ICD-10-CM | POA: Diagnosis not present

## 2023-11-11 DIAGNOSIS — F17210 Nicotine dependence, cigarettes, uncomplicated: Secondary | ICD-10-CM | POA: Diagnosis present

## 2023-11-11 DIAGNOSIS — D72819 Decreased white blood cell count, unspecified: Secondary | ICD-10-CM | POA: Diagnosis present

## 2023-11-11 DIAGNOSIS — Z91148 Patient's other noncompliance with medication regimen for other reason: Secondary | ICD-10-CM | POA: Diagnosis not present

## 2023-11-11 DIAGNOSIS — K8681 Exocrine pancreatic insufficiency: Secondary | ICD-10-CM | POA: Diagnosis present

## 2023-11-11 DIAGNOSIS — F1910 Other psychoactive substance abuse, uncomplicated: Secondary | ICD-10-CM | POA: Diagnosis present

## 2023-11-11 DIAGNOSIS — Z5900 Homelessness unspecified: Secondary | ICD-10-CM | POA: Diagnosis not present

## 2023-11-11 DIAGNOSIS — F4310 Post-traumatic stress disorder, unspecified: Secondary | ICD-10-CM | POA: Diagnosis present

## 2023-11-11 DIAGNOSIS — F1010 Alcohol abuse, uncomplicated: Secondary | ICD-10-CM | POA: Diagnosis present

## 2023-11-11 DIAGNOSIS — Z7141 Alcohol abuse counseling and surveillance of alcoholic: Secondary | ICD-10-CM | POA: Diagnosis not present

## 2023-11-11 DIAGNOSIS — Z888 Allergy status to other drugs, medicaments and biological substances status: Secondary | ICD-10-CM | POA: Diagnosis not present

## 2023-11-11 DIAGNOSIS — G40909 Epilepsy, unspecified, not intractable, without status epilepticus: Secondary | ICD-10-CM | POA: Diagnosis present

## 2023-11-11 DIAGNOSIS — R001 Bradycardia, unspecified: Secondary | ICD-10-CM | POA: Diagnosis present

## 2023-11-11 DIAGNOSIS — E876 Hypokalemia: Secondary | ICD-10-CM | POA: Diagnosis present

## 2023-11-11 DIAGNOSIS — Z21 Asymptomatic human immunodeficiency virus [HIV] infection status: Secondary | ICD-10-CM | POA: Diagnosis present

## 2023-11-11 DIAGNOSIS — F3181 Bipolar II disorder: Secondary | ICD-10-CM | POA: Diagnosis present

## 2023-11-11 DIAGNOSIS — K860 Alcohol-induced chronic pancreatitis: Secondary | ICD-10-CM | POA: Diagnosis present

## 2023-11-11 DIAGNOSIS — Z765 Malingerer [conscious simulation]: Secondary | ICD-10-CM | POA: Diagnosis not present

## 2023-11-11 DIAGNOSIS — Z811 Family history of alcohol abuse and dependence: Secondary | ICD-10-CM | POA: Diagnosis not present

## 2023-11-11 DIAGNOSIS — F209 Schizophrenia, unspecified: Secondary | ICD-10-CM | POA: Diagnosis present

## 2023-11-11 DIAGNOSIS — F419 Anxiety disorder, unspecified: Secondary | ICD-10-CM | POA: Diagnosis present

## 2023-11-11 DIAGNOSIS — Z91199 Patient's noncompliance with other medical treatment and regimen due to unspecified reason: Secondary | ICD-10-CM | POA: Diagnosis not present

## 2023-11-11 LAB — COMPREHENSIVE METABOLIC PANEL
ALT: 16 U/L (ref 0–44)
AST: 15 U/L (ref 15–41)
Albumin: 3.7 g/dL (ref 3.5–5.0)
Alkaline Phosphatase: 44 U/L (ref 38–126)
Anion gap: 8 (ref 5–15)
BUN: 7 mg/dL (ref 6–20)
CO2: 24 mmol/L (ref 22–32)
Calcium: 9.1 mg/dL (ref 8.9–10.3)
Chloride: 106 mmol/L (ref 98–111)
Creatinine, Ser: 0.63 mg/dL (ref 0.61–1.24)
GFR, Estimated: >60 mL/min (ref >60)
Glucose, Bld: 94 mg/dL (ref 70–99)
Potassium: 3.2 mmol/L — ABNORMAL LOW (ref 3.5–5.1)
Sodium: 138 mmol/L (ref 135–145)
Total Bilirubin: 0.3 mg/dL (ref 0.0–1.2)
Total Protein: 7.7 g/dL (ref 6.5–8.1)

## 2023-11-11 LAB — LIPASE, BLOOD: Lipase: 729 U/L — ABNORMAL HIGH (ref 11–51)

## 2023-11-11 LAB — MAGNESIUM: Magnesium: 1.8 mg/dL (ref 1.7–2.4)

## 2023-11-11 MED ORDER — THIAMINE HCL 100 MG/ML IJ SOLN
100.0000 mg | Freq: Once | INTRAMUSCULAR | Status: DC
  Filled 2023-11-11: qty 2

## 2023-11-11 MED ORDER — CHLORDIAZEPOXIDE HCL 25 MG PO CAPS
25.0000 mg | ORAL_CAPSULE | Freq: Three times a day (TID) | ORAL | Status: AC
  Administered 2023-11-12 (×3): 25 mg via ORAL
  Filled 2023-11-11 (×3): qty 1

## 2023-11-11 MED ORDER — ACETAMINOPHEN 650 MG RE SUPP: 650.0000 mg | Freq: Four times a day (QID) | RECTAL | Status: DC | PRN

## 2023-11-11 MED ORDER — MORPHINE SULFATE (PF) 4 MG/ML IV SOLN
4.0000 mg | INTRAVENOUS | Status: DC | PRN
  Administered 2023-11-11 – 2023-11-12 (×4): 4 mg via INTRAVENOUS
  Filled 2023-11-11 (×4): qty 1

## 2023-11-11 MED ORDER — CHLORDIAZEPOXIDE HCL 25 MG PO CAPS: 25.0000 mg | ORAL_CAPSULE | Freq: Four times a day (QID) | ORAL | Status: AC | PRN

## 2023-11-11 MED ORDER — ONDANSETRON HCL 4 MG/2ML IJ SOLN
4.0000 mg | Freq: Once | INTRAMUSCULAR | Status: AC
  Administered 2023-11-11: 4 mg via INTRAVENOUS
  Filled 2023-11-11 (×2): qty 2

## 2023-11-11 MED ORDER — CHLORDIAZEPOXIDE HCL 25 MG PO CAPS
25.0000 mg | ORAL_CAPSULE | Freq: Four times a day (QID) | ORAL | Status: AC
  Administered 2023-11-11 (×4): 25 mg via ORAL
  Filled 2023-11-11 (×4): qty 1

## 2023-11-11 MED ORDER — LEVETIRACETAM 500 MG PO TABS
500.0000 mg | ORAL_TABLET | Freq: Two times a day (BID) | ORAL | Status: DC
  Administered 2023-11-11 – 2023-11-16 (×11): 500 mg via ORAL
  Filled 2023-11-11 (×11): qty 1

## 2023-11-11 MED ORDER — SODIUM CHLORIDE 0.9 % IV SOLN: INTRAVENOUS | Status: AC

## 2023-11-11 MED ORDER — PANTOPRAZOLE SODIUM 40 MG PO TBEC
40.0000 mg | DELAYED_RELEASE_TABLET | Freq: Every day | ORAL | Status: DC
  Administered 2023-11-11 – 2023-11-16 (×6): 40 mg via ORAL
  Filled 2023-11-11 (×6): qty 1

## 2023-11-11 MED ORDER — ONDANSETRON HCL 4 MG/2ML IJ SOLN: 4.0000 mg | Freq: Four times a day (QID) | INTRAMUSCULAR | Status: DC | PRN

## 2023-11-11 MED ORDER — ENOXAPARIN SODIUM 40 MG/0.4ML IJ SOSY
40.0000 mg | PREFILLED_SYRINGE | INTRAMUSCULAR | Status: DC
  Administered 2023-11-13: 40 mg via SUBCUTANEOUS
  Filled 2023-11-11 (×5): qty 0.4

## 2023-11-11 MED ORDER — OXYCODONE-ACETAMINOPHEN 5-325 MG PO TABS
1.0000 | ORAL_TABLET | Freq: Once | ORAL | Status: AC
  Administered 2023-11-11: 1 via ORAL
  Filled 2023-11-11: qty 1

## 2023-11-11 MED ORDER — SODIUM CHLORIDE 0.9 % IV BOLUS
1000.0000 mL | Freq: Once | INTRAVENOUS | Status: AC
Start: 2023-11-11 — End: 2023-11-11
  Administered 2023-11-11: 1000 mL via INTRAVENOUS

## 2023-11-11 MED ORDER — HYDROXYZINE HCL 25 MG PO TABS
25.0000 mg | ORAL_TABLET | Freq: Four times a day (QID) | ORAL | Status: AC | PRN
  Administered 2023-11-11 – 2023-11-14 (×2): 25 mg via ORAL
  Filled 2023-11-11 (×2): qty 1

## 2023-11-11 MED ORDER — ACETAMINOPHEN 325 MG PO TABS
650.0000 mg | ORAL_TABLET | Freq: Four times a day (QID) | ORAL | Status: DC | PRN
  Administered 2023-11-11 – 2023-11-14 (×2): 650 mg via ORAL
  Filled 2023-11-11 (×2): qty 2

## 2023-11-11 MED ORDER — CHLORDIAZEPOXIDE HCL 25 MG PO CAPS
25.0000 mg | ORAL_CAPSULE | ORAL | Status: AC
  Administered 2023-11-13 (×2): 25 mg via ORAL
  Filled 2023-11-11 (×2): qty 1

## 2023-11-11 MED ORDER — LOPERAMIDE HCL 2 MG PO CAPS
2.0000 mg | ORAL_CAPSULE | ORAL | Status: AC | PRN
Start: 2023-11-11 — End: 2023-11-14

## 2023-11-11 MED ORDER — MORPHINE SULFATE (PF) 4 MG/ML IV SOLN
4.0000 mg | Freq: Once | INTRAVENOUS | Status: AC
  Administered 2023-11-11: 4 mg via INTRAVENOUS
  Filled 2023-11-11 (×2): qty 1

## 2023-11-11 MED ORDER — ONDANSETRON 4 MG PO TBDP: 4.0000 mg | ORAL_TABLET | Freq: Four times a day (QID) | ORAL | Status: AC | PRN

## 2023-11-11 MED ORDER — OXYCODONE HCL 5 MG PO TABS
10.0000 mg | ORAL_TABLET | ORAL | Status: DC | PRN
  Administered 2023-11-12 – 2023-11-15 (×14): 10 mg via ORAL
  Filled 2023-11-11 (×14): qty 2

## 2023-11-11 MED ORDER — CHLORDIAZEPOXIDE HCL 25 MG PO CAPS
25.0000 mg | ORAL_CAPSULE | Freq: Every day | ORAL | Status: AC
  Administered 2023-11-14: 25 mg via ORAL
  Filled 2023-11-11: qty 1

## 2023-11-11 MED ORDER — ADULT MULTIVITAMIN W/MINERALS CH
1.0000 | ORAL_TABLET | Freq: Every day | ORAL | Status: DC
  Administered 2023-11-11 – 2023-11-16 (×6): 1 via ORAL
  Filled 2023-11-11 (×6): qty 1

## 2023-11-11 MED ORDER — SUCRALFATE 1 G PO TABS
1.0000 g | ORAL_TABLET | Freq: Once | ORAL | Status: AC
  Administered 2023-11-11: 1 g via ORAL
  Filled 2023-11-11: qty 1

## 2023-11-11 MED ORDER — NALOXONE HCL 0.4 MG/ML IJ SOLN: 0.4000 mg | INTRAMUSCULAR | Status: DC | PRN

## 2023-11-11 NOTE — H&P (Signed)
 History and Physical  WITT PLITT FMW:[id number] DOB: 20-Mar-1986 DOA: 11/10/2023  PCP: Default, Provider, MD   Chief Complaint: Abdominal pain and vomiting  HPI: Gary [name] is a 38 y.o. male with medical history significant for HIV, polysubstance abuse, PTSD, schizophrenia, seizure disorder, alcohol  abuse and recurrent alcohol  induced pancreatitis being admitted to the hospital with recurrent pancreatitis.  He was just discharged from the hospital yesterday after being treated for left lower lobe community-acquired pneumonia.  After discharge from the hospital yesterday afternoon, tells me that the buses were not running to where he wanted to go, so he drank hard liquor instead.  He started to develop abdominal pain, and vomiting, and returned to the ER.  He was diagnosed with pancreatitis.  He was given IV fluids, pain medication and admitted to the hospitalist service.  Denies any fever, hematemesis, or any new concerns.  States that it feels like his pancreatitis.  Review of Systems: Please see HPI for pertinent positives and negatives. A complete 10 system review of systems are otherwise negative.  Past Medical History:  Diagnosis Date   Alcohol  abuse    Alcohol -induced pancreatitis 04/16/2022   Anxiety    Bipolar 2 disorder (HCC)    HIV (human immunodeficiency virus infection) (HCC)    Pancreatitis    PTSD (post-traumatic stress disorder)    Schizophrenia (HCC)    Seizures (HCC)    Subdural hematoma (HCC)    Past Surgical History:  Procedure Laterality Date   BIOPSY  04/19/2022   Procedure: BIOPSY;  Surgeon: Wilhelmenia Aloha Raddle., MD;  Location: [facility] ENDOSCOPY;  Service: Gastroenterology;;   ENTEROSCOPY N/A 04/19/2022   Procedure: ENTEROSCOPY;  Surgeon: Wilhelmenia Aloha Raddle., MD;  Location: [facility] ENDOSCOPY;  Service: Gastroenterology;  Laterality: N/A;   INCISION AND DRAINAGE PERIRECTAL ABSCESS N/A 09/24/2016   Procedure: IRRIGATION AND DEBRIDEMENT PERIRECTAL ABSCESS;   Surgeon: Carlin Pastel, MD;  Location: [facility] ORS;  Service: General;  Laterality: N/A;   none     Social History:  reports that he has been smoking cigarettes. He started smoking about 21 years ago. He has a 10.5 pack-year smoking history. He has never used smokeless tobacco. He reports current alcohol  use of about 105.0 standard drinks of alcohol  per week. He reports current drug use. Drugs: Methamphetamines and Cocaine.  Allergies  Allergen Reactions   Tegretol  [Carbamazepine ] Other (See Comments)    Vertigo   Caffeine Palpitations    Family History  Problem Relation Age of Onset   Alcohol  abuse Mother    Alcohol  abuse Father    Colon cancer Other    Other Other    Cancer Other      Prior to Admission medications   Medication Sig Start Date End Date Taking? Authorizing Provider  bictegravir-emtricitabine -tenofovir  AF (BIKTARVY ) 50-200-25 MG TABS tablet Take 1 tablet by mouth daily. 11/10/23   Drusilla Sabas RAMAN, MD  folic acid  (FOLVITE ) 1 MG tablet Take 1 tablet (1 mg total) by mouth daily. 11/11/23   Drusilla Sabas RAMAN, MD  levETIRAcetam  (KEPPRA ) 500 MG tablet Take 1 tablet (500 mg total) by mouth 2 (two) times daily. 11/06/23 12/09/23  Elgergawy, Brayton RAMAN, MD  lipase/protease/amylase (CREON ) 36000 UNITS CPEP capsule Take 1 capsule (36,000 Units total) by mouth 3 (three) times daily before meals. 11/10/23   Drusilla Sabas RAMAN, MD  oxyCODONE  (OXY IR/ROXICODONE ) 5 MG immediate release tablet Take 1 tablet (5 mg total) by mouth every 6 (six) hours as needed for moderate pain (pain score 4-6). 11/10/23  Drusilla Sabas RAMAN, MD  pantoprazole  (PROTONIX ) 40 MG tablet Take 1 tablet (40 mg total) by mouth daily. 11/10/23   Drusilla Sabas RAMAN, MD  thiamine  (VITAMIN B-1) 100 MG tablet Take 1 tablet (100 mg total) by mouth daily. 11/11/23   Drusilla Sabas RAMAN, MD  famotidine  (PEPCID ) 20 MG tablet Take 1 tablet (20 mg total) by mouth 2 (two) times daily. Patient not taking: Reported on 06/01/2019 05/14/19 06/02/19  [name], MD     Physical Exam: BP 111/75   Pulse 68   Temp 98.7 F (37.1 C) (Oral)   Resp 18   SpO2 99%  General:  Alert, oriented, calm, in no acute distress  Cardiovascular: RRR, no murmurs or rubs, no peripheral edema  Respiratory: clear to auscultation bilaterally, no wheezes, no crackles  Abdomen: soft, tender epigastrum, nondistended, normal bowel tones heard  Skin: dry, no rashes  Musculoskeletal: no joint effusions, normal range of motion  Psychiatric: appropriate affect, normal speech  Neurologic: extraocular muscles intact, clear speech, moving all extremities with intact sensorium         Labs on Admission:  Basic Metabolic Panel: Recent Labs  Lab 11/05/23 0539 11/06/23 1754 11/07/23 0521 11/08/23 0408 11/10/23 2326  NA 133* 131* 135  --  138  K 4.0 3.6 4.0  --  3.2*  CL 99 95* 105  --  106  CO2 26 21* 22  --  24  GLUCOSE 108* 146* 87  --  94  BUN 10 21* 14  --  7  CREATININE 0.81 1.06 0.74  --  0.63  CALCIUM  9.2 9.9 7.7*  --  9.1  MG 1.6*  --  1.6* 2.0  --   PHOS  --   --  4.0  --   --    Liver Function Tests: Recent Labs  Lab 11/06/23 1754 11/07/23 0521 11/10/23 2326  AST 23 19 15   ALT 19 17 16   ALKPHOS 59 46 44  BILITOT 0.4 0.5 0.3  PROT 7.6 5.7* 7.7  ALBUMIN 3.6 2.8* 3.7   Recent Labs  Lab 11/06/23 1754 11/10/23 2326  LIPASE 158* 729*   No results for input(s): AMMONIA in the last 168 hours. CBC: Recent Labs  Lab 11/06/23 1754 11/07/23 0521 11/10/23 2326  WBC 6.9 3.8* 4.4  HGB 12.4* 10.1* 11.9*  HCT 37.8* 30.0* 35.0*  MCV 93.1 91.7 92.1  PLT 204 133* 225   Cardiac Enzymes: No results for input(s): CKTOTAL, CKMB, CKMBINDEX, TROPONINI in the last 168 hours. BNP (last 3 results) No results for input(s): BNP in the last 8760 hours.  ProBNP (last 3 results) No results for input(s): PROBNP in the last 8760 hours.  CBG: Recent Labs  Lab 11/05/23 0807 11/05/23 1214 11/05/23 1550 11/05/23 2122 11/06/23 0806  GLUCAP 102*  112* 109* 109* 93    Radiological Exams on Admission: No results found. Assessment/Plan LONI ABDON is a 38 y.o. male with medical history significant for HIV, polysubstance abuse, PTSD, schizophrenia, seizure disorder, alcohol  abuse and recurrent alcohol  induced pancreatitis being admitted to the hospital with recurrent pancreatitis.   Alcoholic pancreatitis-with abdominal pain, vomiting, elevated lipase.  Note that he does have a well-documented history of drug-seeking behavior, and was resting very comfortably until I started to examine him.  No concerning symptoms, not toxic, currently no indication for repeat imaging. -Inpatient admission -N.p.o. except ice chips and meds -Normal saline infusion -Pain and nausea medication as needed  Chronic alcohol  abuse-drinks hard liquor, last drink 1/1  PM.  Has a history of severe alcohol  withdrawal, requiring phenobarbital .  Currently looks comfortable with no evidence of alcohol  withdrawal. -Thiamine , folate, multivitamin -Librium  taper, with additional doses per CIWA scale  Alcohol  induced seizure-continue Keppra   HIV-noncompliant with medications including Biktarvy .  Will hold this in house, needs to follow-up with ID as an outpatient if he is interested in treatment.  DVT prophylaxis: Lovenox      Code Status: Full Code  Consults called: None  Admission status: The appropriate patient status for this patient is INPATIENT. Inpatient status is judged to be reasonable and necessary in order to provide the required intensity of service to ensure the patient's safety. The patient's presenting symptoms, physical exam findings, and initial radiographic and laboratory data in the context of their chronic comorbidities is felt to place them at high risk for further clinical deterioration. Furthermore, it is not anticipated that the patient will be medically stable for discharge from the hospital within 2 midnights of admission.    I certify that  at the point of admission it is my clinical judgment that the patient will require inpatient hospital care spanning beyond 2 midnights from the point of admission due to high intensity of service, high risk for further deterioration and high frequency of surveillance required  Time spent: 59 minutes  Latamara Melder CHRISTELLA Gail MD [facility] Pager [phone]  If 7PM-7AM, please contact night-coverage [email or web address] Password [email or web address]  11/11/2023, 9:01 AM

## 2023-11-11 NOTE — ED Provider Notes (Signed)
  EMERGENCY DEPARTMENT AT [facility] Provider Note   CSN: [id number] Arrival date & time: 11/10/23  2245     History  Chief Complaint  Patient presents with   Abdominal Pain    Gary [name] is a 38 y.o. male.  HPI     This is a 38 year old male with history of HIV, seizure disorder, chronic pancreatitis, noncompliance who presents with abdominal pain.  Just discharged yesterday from the hospital after being admitted for pneumonia.  Patient reports that he went out and drank because is just so hard to quit.  Now he is complaining of abdominal pain consistent with his pancreatitis.  He reports nausea without vomiting.  Last CT imaging was 12/19.  Home Medications Prior to Admission medications   Medication Sig Start Date End Date Taking? Authorizing Provider  bictegravir-emtricitabine -tenofovir  AF (BIKTARVY ) 50-200-25 MG TABS tablet Take 1 tablet by mouth daily. 11/10/23   Drusilla Sabas RAMAN, MD  folic acid  (FOLVITE ) 1 MG tablet Take 1 tablet (1 mg total) by mouth daily. 11/11/23   Drusilla Sabas RAMAN, MD  levETIRAcetam  (KEPPRA ) 500 MG tablet Take 1 tablet (500 mg total) by mouth 2 (two) times daily. 11/06/23 12/09/23  Elgergawy, Brayton RAMAN, MD  lipase/protease/amylase (CREON ) 36000 UNITS CPEP capsule Take 1 capsule (36,000 Units total) by mouth 3 (three) times daily before meals. 11/10/23   Drusilla Sabas RAMAN, MD  oxyCODONE  (OXY IR/ROXICODONE ) 5 MG immediate release tablet Take 1 tablet (5 mg total) by mouth every 6 (six) hours as needed for moderate pain (pain score 4-6). 11/10/23   Drusilla Sabas RAMAN, MD  pantoprazole  (PROTONIX ) 40 MG tablet Take 1 tablet (40 mg total) by mouth daily. 11/10/23   Drusilla Sabas RAMAN, MD  thiamine  (VITAMIN B-1) 100 MG tablet Take 1 tablet (100 mg total) by mouth daily. 11/11/23   Drusilla Sabas RAMAN, MD  famotidine  (PEPCID ) 20 MG tablet Take 1 tablet (20 mg total) by mouth 2 (two) times daily. Patient not taking: Reported on 06/01/2019 05/14/19 06/02/19  [name],  MD      Allergies    Tegretol  [carbamazepine ] and Caffeine    Review of Systems   Review of Systems  Constitutional:  Negative for fever.  Respiratory:  Negative for shortness of breath.   Cardiovascular:  Negative for chest pain.  Gastrointestinal:  Positive for abdominal pain and nausea. Negative for vomiting.    Physical Exam Updated Vital Signs BP 109/79 (BP Location: Right Arm)   Pulse 77   Temp 98.7 F (37.1 C) (Oral)   Resp 18   SpO2 99%  Physical Exam Vitals and nursing note reviewed.  Constitutional:      Appearance: He is not ill-appearing.  HENT:     Head: Normocephalic and atraumatic.     Mouth/Throat:     Mouth: Mucous membranes are dry.  Eyes:     Pupils: Pupils are equal, round, and reactive to light.  Cardiovascular:     Rate and Rhythm: Regular rhythm. Tachycardia present.     Heart sounds: Normal heart sounds. No murmur heard. Pulmonary:     Effort: Pulmonary effort is normal. No respiratory distress.     Breath sounds: Normal breath sounds. No wheezing.  Abdominal:     General: Bowel sounds are normal.     Palpations: Abdomen is soft.     Tenderness: There is abdominal tenderness in the right upper quadrant and epigastric area. There is no guarding or rebound.  Musculoskeletal:  Cervical back: Neck supple.  Lymphadenopathy:     Cervical: No cervical adenopathy.  Skin:    General: Skin is warm and dry.  Neurological:     Mental Status: He is alert and oriented to person, place, and time.  Psychiatric:        Mood and Affect: Mood normal.     ED Results / Procedures / Treatments   Labs (all labs ordered are listed, but only abnormal results are displayed) Labs Reviewed  LIPASE, BLOOD - Abnormal; Notable for the following components:      Result Value   Lipase 729 (*)    All other components within normal limits  COMPREHENSIVE METABOLIC PANEL - Abnormal; Notable for the following components:   Potassium 3.2 (*)    All other  components within normal limits  CBC - Abnormal; Notable for the following components:   RBC 3.80 (*)    Hemoglobin 11.9 (*)    HCT 35.0 (*)    RDW 15.9 (*)    All other components within normal limits  URINALYSIS, ROUTINE W REFLEX MICROSCOPIC    EKG None  Radiology No results found.  Procedures .Ultrasound ED Peripheral IV (Provider)  Date/Time: 11/11/2023 6:13 AM  Performed by: Bari Charmaine FALCON, MD Authorized by: Bari Charmaine FALCON, MD   Procedure details:    Indications: multiple failed IV attempts     Skin Prep: chlorhexidine  gluconate     Location:  Left AC   Angiocath:  18 G   Bedside Ultrasound Guided: Yes     Images: not archived     Patient tolerated procedure without complications: Yes     Dressing applied: Yes       Medications Ordered in ED Medications  sucralfate  (CARAFATE ) tablet 1 g (has no administration in time range)  morphine  (PF) 4 MG/ML injection 4 mg (has no administration in time range)  ondansetron  (ZOFRAN ) injection 4 mg (has no administration in time range)  sodium chloride  0.9 % bolus 1,000 mL (has no administration in time range)  oxyCODONE -acetaminophen  (PERCOCET/ROXICET) 5-325 MG per tablet 1 tablet (1 tablet Oral Given 11/11/23 0509)    ED Course/ Medical Decision Making/ A&P                                 Medical Decision Making Amount and/or Complexity of Data Reviewed Labs: ordered.  Risk Prescription drug management. Decision regarding hospitalization.   This patient presents to the ED for concern of abdominal pain, this involves an extensive number of treatment options, and is a complaint that carries with it a high risk of complications and morbidity.  I considered the following differential and admission for this acute, potentially life threatening condition.  The differential diagnosis includes chronic pancreatitis, cholecystitis, gastritis, less likely SBO  MDM:    This is a 38 year old male well-known to our  emergency department who was just discharged yesterday from the hospital for pneumonia who presents with abdominal pain.  Patient reports that he left the hospital and drank alcohol .  He began to have epigastric abdominal pain.  This is consistent with his prior pancreatitis.  I had a long discussion with the patient that as long as he continues to drink he will continue to have significant pain.  He expresses frustration with not being able to stop.  Labs obtained.  Normal white count.  LFTs normal.  Lipase elevated acutely to 729.  Potassium 3.2.  729  is significantly elevated from patient's baseline.  His last CT scan on 12/19 did not show any evidence of pancreatic pseudocyst or complication.  Will treat symptomatically.  Patient was given pain and nausea medication as well as fluids and Carafate .  He had ongoing symptoms.  Will plan for admission to the hospitalist.  (Labs, imaging, consults)  Labs: I Ordered, and personally interpreted labs.  The pertinent results include: CBC, CMP, lipase  Imaging Studies ordered: I ordered imaging studies including none I independently visualized and interpreted imaging. I agree with the radiologist interpretation  Additional history obtained from chart review.  External records from outside source obtained and reviewed including studies  Cardiac Monitoring: The patient was maintained on a cardiac monitor.  If on the cardiac monitor, I personally viewed and interpreted the cardiac monitored which showed an underlying rhythm of: NS  Reevaluation: After the interventions noted above, I reevaluated the patient and found that they have :stayed the same  Social Determinants of Health:  homeless  Disposition: Admit  Co morbidities that complicate the patient evaluation  Past Medical History:  Diagnosis Date   Alcohol  abuse    Alcohol -induced pancreatitis 04/16/2022   Anxiety    Bipolar 2 disorder (HCC)    HIV (human immunodeficiency virus  infection) (HCC)    Pancreatitis    PTSD (post-traumatic stress disorder)    Schizophrenia (HCC)    Seizures (HCC)    Subdural hematoma (HCC)      Medicines Meds ordered this encounter  Medications   oxyCODONE -acetaminophen  (PERCOCET/ROXICET) 5-325 MG per tablet 1 tablet    Refill:  0   sucralfate  (CARAFATE ) tablet 1 g   morphine  (PF) 4 MG/ML injection 4 mg   ondansetron  (ZOFRAN ) injection 4 mg   sodium chloride  0.9 % bolus 1,000 mL    I have reviewed the patients home medicines and have made adjustments as needed  Problem List / ED Course: Problem List Items Addressed This Visit       Digestive   Chronic pancreatitis (HCC) - Primary   Relevant Medications   sucralfate  (CARAFATE ) tablet 1 g   morphine  (PF) 4 MG/ML injection 4 mg                Final Clinical Impression(s) / ED Diagnoses Final diagnoses:  Alcohol -induced chronic pancreatitis [facility])    Rx / DC Orders ED Discharge Orders     None         Bari Charmaine FALCON, MD 11/11/23 [phone]

## 2023-11-11 NOTE — Progress Notes (Signed)
  Carryover admission to the Day Admitter.  I discussed this case with the EDP, Dr. Charmaine Bough.  Per these discussions:   This is a 38 year old male with history of recurrent alcoholic pancreatitis, with multiple recent hospitalizations for such, HIV, chronic alcohol  abuse, who is being hospitalized for acute alcoholic pancreatitis after presenting with worsening epigastric discomfort associate with nausea/vomiting after consuming alcohol  on 11/10/2023.  Lipase shows a 6-7 fold increase relative to this recent prior, is now noted to be 700.  Given multiple prior recent CT scans of the abdomen, no additional imaging was pursued this evening.  I have placed an order for inpatient admission to MedSurg for further evaluation management of the above  I have placed some additional preliminary admit orders via the adult multi-morbid admission order set. I have also ordered clear liquid diet, prn IV Zofran , prn IV Dilaudid , and add on serum magnesium  level.    Eva Pore, DO Hospitalist

## 2023-11-12 DIAGNOSIS — K852 Alcohol induced acute pancreatitis without necrosis or infection: Secondary | ICD-10-CM | POA: Diagnosis not present

## 2023-11-12 LAB — CBC WITH DIFFERENTIAL/PLATELET
Abs Immature Granulocytes: 0 K/uL (ref 0.00–0.07)
Basophils Absolute: 0 K/uL (ref 0.0–0.1)
Basophils Relative: 1 %
Eosinophils Absolute: 0.2 K/uL (ref 0.0–0.5)
Eosinophils Relative: 5 %
HCT: 32 % — ABNORMAL LOW (ref 39.0–52.0)
Hemoglobin: 10.4 g/dL — ABNORMAL LOW (ref 13.0–17.0)
Immature Granulocytes: 0 %
Lymphocytes Relative: 39 %
Lymphs Abs: 1.2 K/uL (ref 0.7–4.0)
MCH: 31 pg (ref 26.0–34.0)
MCHC: 32.5 g/dL (ref 30.0–36.0)
MCV: 95.2 fL (ref 80.0–100.0)
Monocytes Absolute: 0.3 K/uL (ref 0.1–1.0)
Monocytes Relative: 9 %
Neutro Abs: 1.4 K/uL — ABNORMAL LOW (ref 1.7–7.7)
Neutrophils Relative %: 46 %
Platelets: 162 K/uL (ref 150–400)
RBC: 3.36 MIL/uL — ABNORMAL LOW (ref 4.22–5.81)
RDW: 15.8 % — ABNORMAL HIGH (ref 11.5–15.5)
WBC: 3.1 K/uL — ABNORMAL LOW (ref 4.0–10.5)
nRBC: 0 % (ref 0.0–0.2)

## 2023-11-12 LAB — BASIC METABOLIC PANEL
Anion gap: 10 (ref 5–15)
BUN: 7 mg/dL (ref 6–20)
CO2: 21 mmol/L — ABNORMAL LOW (ref 22–32)
Calcium: 8.7 mg/dL — ABNORMAL LOW (ref 8.9–10.3)
Chloride: 104 mmol/L (ref 98–111)
Creatinine, Ser: 0.49 mg/dL — ABNORMAL LOW (ref 0.61–1.24)
GFR, Estimated: >60 mL/min (ref >60)
Glucose, Bld: 62 mg/dL — ABNORMAL LOW (ref 70–99)
Potassium: 3.3 mmol/L — ABNORMAL LOW (ref 3.5–5.1)
Sodium: 135 mmol/L (ref 135–145)

## 2023-11-12 LAB — LIPASE, BLOOD: Lipase: 64 U/L — ABNORMAL HIGH (ref 11–51)

## 2023-11-12 MED ORDER — BICTEGRAVIR-EMTRICITAB-TENOFOV 50-200-25 MG PO TABS
1.0000 | ORAL_TABLET | Freq: Every day | ORAL | Status: DC
  Administered 2023-11-12 – 2023-11-16 (×5): 1 via ORAL
  Filled 2023-11-12 (×5): qty 1

## 2023-11-12 MED ORDER — SODIUM CHLORIDE 0.9 % IV SOLN: INTRAVENOUS | Status: DC

## 2023-11-12 MED ORDER — PANCRELIPASE (LIP-PROT-AMYL) 12000-38000 UNITS PO CPEP
36000.0000 [IU] | ORAL_CAPSULE | Freq: Three times a day (TID) | ORAL | Status: DC
  Administered 2023-11-12 – 2023-11-16 (×13): 36000 [IU] via ORAL
  Filled 2023-11-12 (×14): qty 3

## 2023-11-12 MED ORDER — MORPHINE SULFATE (PF) 2 MG/ML IV SOLN
2.0000 mg | INTRAVENOUS | Status: DC | PRN
  Administered 2023-11-13: 2 mg via INTRAVENOUS
  Filled 2023-11-12: qty 1

## 2023-11-12 MED ORDER — POTASSIUM CHLORIDE CRYS ER 20 MEQ PO TBCR
40.0000 meq | EXTENDED_RELEASE_TABLET | Freq: Two times a day (BID) | ORAL | Status: AC
  Administered 2023-11-12 (×2): 40 meq via ORAL
  Filled 2023-11-12 (×2): qty 2

## 2023-11-12 NOTE — Progress Notes (Signed)
 PROGRESS NOTE  Gary [name] FMW:[id number] DOB: Aug 24, 1986 DOA: 11/10/2023 PCP: Default, Provider, MD  HPI/Recap of past 24 hours: Gary [name] is a 38 y.o. male with medical history significant for HIV, polysubstance abuse, PTSD, schizophrenia, seizure disorder, alcohol  abuse and recurrent alcohol  induced pancreatitis, with multiple recurrent admissions, presented with recurrent pancreatitis. Pt was just discharged from the hospital on 11/10/23 pm and returned later that night, after being treated for left lower lobe community-acquired pneumonia.  After discharge, he went back to drinking, developed abdominal pain, and vomiting, and returned to the ER.  In the ER, lipase noted to be 729.  Patient admitted for further management.    Today, patient still reporting abdominal pain, although noted it is improving and is eager to eat.  Reported he cannot stop drinking because he is very anxious and worried about going into withdrawal.  Like to be started on anxiety meds, but has no money.  So because of this he uses alcohol  to help out with his anxiety.  He usually steals alcohol , or gets from his friends.  Advised and educated patient extensively.  Noted to be bradycardic, asymptomatic as well as soft BP, possibly from IV narcotics   Assessment/Plan: Principal Problem:   Acute alcoholic pancreatitis   Recurrent alcoholic pancreatitis Lipase 729--> 64, trended down Last imaging done on 10/28/23 showed severe pancreatitis, no need for repeat Switch to clear liquid diet, advance as tolerated Continue IV fluids Pain management, antiemetics  Hypokalemia Replace as needed  Leukopenia/normocytic anemia In the setting of possible bone marrow suppression from alcohol  versus HIV Daily CBC   Alcohol  abuse Polysubstance abuse Continues to drink alcohol  daily  History of severe alcohol  withdrawal, requiring phenobarbital  UDS pending Thiamine , folate, multivitamin Librium  taper, with  additional doses per CIWA scale   Alcohol  induced seizure Continue Keppra    HIV Noncompliant with medication Continue Biktarvy  Advised to follow-up with ID as an outpatient and be compliant with medication     Estimated body mass index is 17.73 kg/m as calculated from the following:   Height as of 10/31/23: 6' (1.829 m).   Weight as of 11/05/23: 59.3 kg.     Code Status: Full  Family Communication: None at bedside  Disposition Plan: Status is: Inpatient Remains inpatient appropriate because: Level of care      Consultants: None  Procedures: None  Antimicrobials: None  DVT prophylaxis: Lovenox    Objective: Vitals:   11/12/23 0151 11/12/23 0411 11/12/23 0548 11/12/23 1200  BP: 104/67 104/70  (!) 95/56  Pulse: (!) 53 (!) 54 (!) 48 (!) 47  Resp: 18 18    Temp: 98 F (36.7 C) 98 F (36.7 C)    TempSrc:  Oral    SpO2: 98% 100%      Intake/Output Summary (Last 24 hours) at 11/12/2023 1343 Last data filed at 11/12/2023 0600 Gross per 24 hour  Intake 1268.33 ml  Output 0 ml  Net 1268.33 ml   There were no vitals filed for this visit.  Exam: General: NAD  Cardiovascular: S1, S2 present Respiratory: CTAB Abdomen: Soft, nontender, nondistended, bowel sounds present Musculoskeletal: No bilateral pedal edema noted Skin: Normal Psychiatry: Normal mood     Data Reviewed: CBC: Recent Labs  Lab 11/06/23 1754 11/07/23 0521 11/10/23 2326 11/12/23 0832  WBC 6.9 3.8* 4.4 3.1*  NEUTROABS  --   --   --  1.4*  HGB 12.4* 10.1* 11.9* 10.4*  HCT 37.8* 30.0* 35.0* 32.0*  MCV 93.1 91.7 92.1 95.2  PLT 204 133* 225 162   Basic Metabolic Panel: Recent Labs  Lab 11/06/23 1754 11/07/23 0521 11/08/23 0408 11/10/23 2326 11/11/23 0934 11/12/23 0832  NA 131* 135  --  138  --  135  K 3.6 4.0  --  3.2*  --  3.3*  CL 95* 105  --  106  --  104  CO2 21* 22  --  24  --  21*  GLUCOSE 146* 87  --  94  --  62*  BUN 21* 14  --  7  --  7  CREATININE 1.06 0.74  --   0.63  --  0.49*  CALCIUM  9.9 7.7*  --  9.1  --  8.7*  MG  --  1.6* 2.0  --  1.8  --   PHOS  --  4.0  --   --   --   --    GFR: Estimated Creatinine Clearance: 106 mL/min (A) (by C-G formula based on SCr of 0.49 mg/dL (L)). Liver Function Tests: Recent Labs  Lab 11/06/23 1754 11/07/23 0521 11/10/23 2326  AST 23 19 15   ALT 19 17 16   ALKPHOS 59 46 44  BILITOT 0.4 0.5 0.3  PROT 7.6 5.7* 7.7  ALBUMIN 3.6 2.8* 3.7   Recent Labs  Lab 11/06/23 1754 11/10/23 2326 11/12/23 0832  LIPASE 158* 729* 64*   No results for input(s): AMMONIA in the last 168 hours. Coagulation Profile: No results for input(s): INR, PROTIME in the last 168 hours. Cardiac Enzymes: No results for input(s): CKTOTAL, CKMB, CKMBINDEX, TROPONINI in the last 168 hours. BNP (last 3 results) No results for input(s): PROBNP in the last 8760 hours. HbA1C: No results for input(s): HGBA1C in the last 72 hours. CBG: Recent Labs  Lab 11/05/23 1550 11/05/23 2122 11/06/23 0806  GLUCAP 109* 109* 93   Lipid Profile: No results for input(s): CHOL, HDL, LDLCALC, TRIG, CHOLHDL, LDLDIRECT in the last 72 hours. Thyroid  Function Tests: No results for input(s): TSH, T4TOTAL, FREET4, T3FREE, THYROIDAB in the last 72 hours. Anemia Panel: No results for input(s): VITAMINB12, FOLATE, FERRITIN, TIBC, IRON, RETICCTPCT in the last 72 hours. Urine analysis:    Component Value Date/Time   COLORURINE YELLOW 11/06/2023 2039   APPEARANCEUR CLEAR 11/06/2023 2039   APPEARANCEUR Clear 11/15/2014 1755   LABSPEC 1.008 11/06/2023 2039   LABSPEC 1.002 11/15/2014 1755   PHURINE 5.0 11/06/2023 2039   GLUCOSEU NEGATIVE 11/06/2023 2039   GLUCOSEU Negative 11/15/2014 1755   HGBUR MODERATE (A) 11/06/2023 2039   BILIRUBINUR NEGATIVE 11/06/2023 2039   BILIRUBINUR Negative 11/15/2014 1755   KETONESUR NEGATIVE 11/06/2023 2039   PROTEINUR NEGATIVE 11/06/2023 2039   NITRITE NEGATIVE  11/06/2023 2039   LEUKOCYTESUR NEGATIVE 11/06/2023 2039   LEUKOCYTESUR Negative 11/15/2014 1755   Sepsis Labs: @LABRCNTIP (procalcitonin:4,lacticidven:4)  )No results found for this or any previous visit (from the past 240 hours).    Studies: No results found.  Scheduled Meds:  bictegravir-emtricitabine -tenofovir  AF  1 tablet Oral Daily   chlordiazePOXIDE   25 mg Oral TID   Followed by   NOREEN ON 11/13/2023] chlordiazePOXIDE   25 mg Oral BH-qamhs   Followed by   NOREEN ON 11/14/2023] chlordiazePOXIDE   25 mg Oral Daily   enoxaparin  (LOVENOX ) injection  40 mg Subcutaneous Q24H   levETIRAcetam   500 mg Oral BID   lipase/protease/amylase  36,000 Units Oral TID AC   multivitamin with minerals  1 tablet Oral Daily   pantoprazole   40 mg Oral Daily   potassium chloride   40 mEq Oral BID   thiamine   100 mg Intramuscular Once    Continuous Infusions:  sodium chloride  100 mL/hr at 11/12/23 1248     LOS: 1 day     Lebron JINNY Cage, MD [facility]  If 7PM-7AM, please contact night-coverage [email or web address] 11/12/2023, 1:43 PM

## 2023-11-12 NOTE — Plan of Care (Signed)
   Problem: Education: Goal: Knowledge of General Education information will improve Description: Including pain rating scale, medication(s)/side effects and non-pharmacologic comfort measures Outcome: Progressing   Problem: Clinical Measurements: Goal: Ability to maintain clinical measurements within normal limits will improve Outcome: Progressing

## 2023-11-12 NOTE — TOC Initial Note (Signed)
 Transition of Care [facility]) - Initial/Assessment Note   Patient Details  Name: Gary [name] MRN: [id number] Date of Birth: 11-29-1985  [facility]) CM/SW Contact:    Duwaine GORMAN Aran, LCSW Phone Number: 11/12/2023, 12:42 PM  Clinical Narrative: Patient is currently homeless and recently discharged from the hospital on 11/10/23. Patient was provided ETOH use and shelter resources at time of discharge. TOC following for discharge needs.  Expected Discharge Plan: Homeless Shelter Barriers to Discharge: Continued Medical Work up  Patient Goals and CMS Choice Choice offered to / list presented to : NA  Expected Discharge Plan and Services In-house Referral: Clinical Social Work Post Acute Care Choice: NA Living arrangements for the past 2 months: Homeless           DME Arranged: N/A DME Agency: NA  Prior Living Arrangements/Services Living arrangements for the past 2 months: Homeless Lives with:: Self Patient language and need for interpreter reviewed:: Yes Need for Family Participation in Patient Care: Yes (Comment) Care giver support system in place?: Yes (comment) Criminal Activity/Legal Involvement Pertinent to Current Situation/Hospitalization: No - Comment as needed  Emotional Assessment Orientation: : Oriented to Self, Oriented to Place, Oriented to  Time, Oriented to Situation Alcohol  / Substance Use: Alcohol  Use Psych Involvement: No (comment)  Admission diagnosis:  Alcohol -induced chronic pancreatitis (HCC) [K86.0] Acute alcoholic pancreatitis [K85.20] Patient Active Problem List   Diagnosis Date Noted   Pneumonia 11/06/2023   Nausea and vomiting 11/01/2023   Acute pancreatitis 10/31/2023   Chronic pancreatitis (HCC) 10/16/2023   Acute on chronic pancreatitis (HCC) 10/16/2023   Recurrent acute pancreatitis 10/09/2023   Macrocytic anemia 09/26/2023   Alcoholic fatty liver 09/25/2023   Protein-calorie malnutrition, severe 12/25/2022   Hypomagnesemia  12/24/2022   Alcohol  withdrawal syndrome without complication (HCC) 12/23/2022   Seizure disorder (HCC) 12/17/2022   Pancytopenia (HCC) 12/14/2022   Alcohol -induced pancreatitis 12/14/2022   Delirium tremens (HCC) 10/19/2022   Alcohol  abuse with alcohol -induced mood disorder (HCC) 10/16/2022   Alcohol  dependence with withdrawal (HCC) 10/16/2022   Abnormal LFTs 10/16/2022   Acute alcoholic pancreatitis 09/11/2022   AKI (acute kidney injury) (HCC) 05/28/2022   Homelessness 05/28/2022   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   Metabolic acidosis with increased anion gap and accumulation of organic acids 04/16/2022   Hypokalemia 01/22/2022   Alcohol -induced insomnia (HCC)    MDD (major depressive disorder), recurrent episode, 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/26/2021   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   Alcohol  abuse    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   Malingering 05/15/2020   Malnutrition of moderate degree 05/07/2020   Alcoholic ketoacidosis 05/05/2020   Thrombocytopenia (HCC) 07/25/2019   Traumatic subdural hematoma (HCC) 07/02/2019   Alcoholic intoxication with complication (HCC) 07/02/2019   Hypocalcemia 07/02/2019   Alcohol -induced mood disorder (HCC) 05/13/2019   Alcohol  use disorder, severe, dependence (HCC) 04/16/2019   Major depressive disorder, 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   HIV (human immunodeficiency virus infection) (HCC) 06/16/2018   Polysubstance abuse (HCC) 06/16/2018   Nicotine  dependence, cigarettes, uncomplicated 06/16/2018   PCP:  Default, Provider, MD Pharmacy:   [name] - [facility] [address] [place] [address] Phone: [phone] Fax: [phone]  [facility]) [phone] - [place], [place] - [address] AT [facility] [address] [place] [address] Phone: [phone] Fax: [phone]  [id number] - [place], [place] - [address] AT [facility] OF [address] [place] [address] Phone: [phone] Fax: [phone]  Social Drivers of Health (SDOH) Social History: SDOH Screenings   Food Insecurity: Food Insecurity Present (11/11/2023)  Housing: High Risk (11/11/2023)  Transportation Needs: Unmet Transportation Needs (11/11/2023)  Utilities: At Risk (11/11/2023)  Alcohol  Screen: High Risk (07/25/2021)  Depression (PHQ2-9): Medium Risk (02/12/2021)  Financial Resource Strain: Patient Declined (10/11/2023)  Social Connections: Unknown (06/24/2022)   Received from [facility]  Stress: No Stress Concern Present (06/30/2022)   Received from [facility], [facility]  Recent Concern: Stress - Stress Concern Present (06/25/2022)   Received from [facility]  Tobacco Use: High Risk (11/06/2023)   SDOH Interventions:    Readmission Risk Interventions    11/12/2023   12:41 PM 11/04/2023    2:28 PM 10/25/2023    1:58 PM  Readmission Risk Prevention Plan  Transportation Screening Complete Complete Complete  Medication Review Oceanographer) Complete Complete Complete  PCP or Specialist appointment within 3-5 days of discharge  Patient refused   HRI or Home Care Consult Complete Complete Complete  SW Recovery Care/Counseling Consult Complete Complete Complete  Palliative Care Screening Not Applicable Not Applicable Not Applicable  Skilled Nursing Facility Not Applicable Not Applicable Not Applicable

## 2023-11-13 DIAGNOSIS — K852 Alcohol induced acute pancreatitis without necrosis or infection: Secondary | ICD-10-CM | POA: Diagnosis not present

## 2023-11-13 LAB — CBC WITH DIFFERENTIAL/PLATELET
Abs Immature Granulocytes: 0.01 10*3/uL (ref 0.00–0.07)
Basophils Absolute: 0 10*3/uL (ref 0.0–0.1)
Basophils Relative: 1 %
Eosinophils Absolute: 0.1 10*3/uL (ref 0.0–0.5)
Eosinophils Relative: 2 %
HCT: 33.3 % — ABNORMAL LOW (ref 39.0–52.0)
Hemoglobin: 10.8 g/dL — ABNORMAL LOW (ref 13.0–17.0)
Immature Granulocytes: 0 %
Lymphocytes Relative: 10 %
Lymphs Abs: 0.3 10*3/uL — ABNORMAL LOW (ref 0.7–4.0)
MCH: 31 pg (ref 26.0–34.0)
MCHC: 32.4 g/dL (ref 30.0–36.0)
MCV: 95.7 fL (ref 80.0–100.0)
Monocytes Absolute: 0.6 10*3/uL (ref 0.1–1.0)
Monocytes Relative: 20 %
Neutro Abs: 2.1 10*3/uL (ref 1.7–7.7)
Neutrophils Relative %: 67 %
Platelets: 152 10*3/uL (ref 150–400)
RBC: 3.48 MIL/uL — ABNORMAL LOW (ref 4.22–5.81)
RDW: 15.9 % — ABNORMAL HIGH (ref 11.5–15.5)
WBC: 3.2 10*3/uL — ABNORMAL LOW (ref 4.0–10.5)
nRBC: 0 % (ref 0.0–0.2)

## 2023-11-13 LAB — MAGNESIUM: Magnesium: 1.7 mg/dL (ref 1.7–2.4)

## 2023-11-13 LAB — BASIC METABOLIC PANEL
Anion gap: 7 (ref 5–15)
BUN: 5 mg/dL — ABNORMAL LOW (ref 6–20)
CO2: 23 mmol/L (ref 22–32)
Calcium: 8.9 mg/dL (ref 8.9–10.3)
Chloride: 103 mmol/L (ref 98–111)
Creatinine, Ser: 0.58 mg/dL — ABNORMAL LOW (ref 0.61–1.24)
GFR, Estimated: >60 mL/min (ref >60)
Glucose, Bld: 91 mg/dL (ref 70–99)
Potassium: 4.2 mmol/L (ref 3.5–5.1)
Sodium: 133 mmol/L — ABNORMAL LOW (ref 135–145)

## 2023-11-13 LAB — RAPID URINE DRUG SCREEN, HOSP PERFORMED
Amphetamines: NOT DETECTED
Barbiturates: NOT DETECTED
Benzodiazepines: POSITIVE — AB
Cocaine: NOT DETECTED
Opiates: POSITIVE — AB
Tetrahydrocannabinol: NOT DETECTED

## 2023-11-13 LAB — URINALYSIS, ROUTINE W REFLEX MICROSCOPIC
Bilirubin Urine: NEGATIVE
Glucose, UA: NEGATIVE mg/dL
Hgb urine dipstick: NEGATIVE
Ketones, ur: NEGATIVE mg/dL
Leukocytes,Ua: NEGATIVE
Nitrite: NEGATIVE
Protein, ur: NEGATIVE mg/dL
Specific Gravity, Urine: 1.011 (ref 1.005–1.030)
pH: 5 (ref 5.0–8.0)

## 2023-11-13 NOTE — Progress Notes (Signed)
 PROGRESS NOTE  Gary [name] FMW:[id number] DOB: Apr 08, 1986 DOA: 11/10/2023 PCP: Default, Provider, MD  HPI/Recap of past 24 hours: Gary [name] is a 38 y.o. male with medical history significant for HIV, polysubstance abuse, PTSD, schizophrenia, seizure disorder, alcohol  abuse and recurrent alcohol  induced pancreatitis, with multiple recurrent admissions, presented with recurrent pancreatitis. Pt was just discharged from the hospital on 11/10/23 pm and returned later that night, after being treated for left lower lobe community-acquired pneumonia.  After discharge, he went back to drinking, developed abdominal pain, and vomiting, and returned to the ER.  In the ER, lipase noted to be 729.  Patient admitted for further management.    Patient still complaining of abdominal pain.  Insisting of having a soft diet, and would eat as tolerated   Assessment/Plan: Principal Problem:   Acute alcoholic pancreatitis   Recurrent alcoholic pancreatitis Lipase 729--> 64, trended down Last imaging done on 10/28/23 showed severe pancreatitis, no need for repeat On soft diet, advance as tolerated Continue IV fluids Pain management, antiemetics  Hypokalemia Replace as needed  Leukopenia/normocytic anemia In the setting of possible bone marrow suppression from alcohol  versus HIV Daily CBC   Alcohol  abuse Polysubstance abuse Continues to drink alcohol  daily  History of severe alcohol  withdrawal, requiring phenobarbital  UDS positive for opiates and benzos (UDS collected while on the floor) Thiamine , folate, multivitamin Librium  taper, with additional doses per CIWA scale   Alcohol  induced seizure Continue Keppra    HIV Noncompliant with medication Continue Biktarvy  Advised to follow-up with ID as an outpatient and be compliant with medication     Estimated body mass index is 18.96 kg/m as calculated from the following:   Height as of 10/31/23: 6' (1.829 m).   Weight as of this  encounter: 63.4 kg.     Code Status: Full  Family Communication: None at bedside  Disposition Plan: Status is: Inpatient Remains inpatient appropriate because: Level of care      Consultants: None  Procedures: None  Antimicrobials: None  DVT prophylaxis: Lovenox    Objective: Vitals:   11/12/23 1408 11/12/23 2137 11/13/23 0610 11/13/23 1439  BP: 96/61 (!) 92/58 96/60 95/67   Pulse: (!) 51 64 83 73  Resp: 18 18 17 18   Temp: 98.4 F (36.9 C) 98.7 F (37.1 C) 99 F (37.2 C) 100.2 F (37.9 C)  TempSrc: Oral Oral Oral Oral  SpO2: 100% 98% 98% 98%  Weight:   63.4 kg     Intake/Output Summary (Last 24 hours) at 11/13/2023 1509 Last data filed at 11/13/2023 1400 Gross per 24 hour  Intake 5090.4 ml  Output 0 ml  Net 5090.4 ml   Filed Weights   11/13/23 0610  Weight: 63.4 kg    Exam: General: NAD  Cardiovascular: S1, S2 present Respiratory: CTAB Abdomen: Soft, nontender, nondistended, bowel sounds present Musculoskeletal: No bilateral pedal edema noted Skin: Normal Psychiatry: Normal mood     Data Reviewed: CBC: Recent Labs  Lab 11/06/23 1754 11/07/23 0521 11/10/23 2326 11/12/23 0832 11/13/23 0607  WBC 6.9 3.8* 4.4 3.1* 3.2*  NEUTROABS  --   --   --  1.4* 2.1  HGB 12.4* 10.1* 11.9* 10.4* 10.8*  HCT 37.8* 30.0* 35.0* 32.0* 33.3*  MCV 93.1 91.7 92.1 95.2 95.7  PLT 204 133* 225 162 152   Basic Metabolic Panel: Recent Labs  Lab 11/06/23 1754 11/07/23 0521 11/08/23 0408 11/10/23 2326 11/11/23 0934 11/12/23 0832 11/13/23 0607  NA 131* 135  --  138  --  135  133*  K 3.6 4.0  --  3.2*  --  3.3* 4.2  CL 95* 105  --  106  --  104 103  CO2 21* 22  --  24  --  21* 23  GLUCOSE 146* 87  --  94  --  62* 91  BUN 21* 14  --  7  --  7 5*  CREATININE 1.06 0.74  --  0.63  --  0.49* 0.58*  CALCIUM  9.9 7.7*  --  9.1  --  8.7* 8.9  MG  --  1.6* 2.0  --  1.8  --  1.7  PHOS  --  4.0  --   --   --   --   --    GFR: Estimated Creatinine Clearance: 113.4  mL/min (A) (by C-G formula based on SCr of 0.58 mg/dL (L)). Liver Function Tests: Recent Labs  Lab 11/06/23 1754 11/07/23 0521 11/10/23 2326  AST 23 19 15   ALT 19 17 16   ALKPHOS 59 46 44  BILITOT 0.4 0.5 0.3  PROT 7.6 5.7* 7.7  ALBUMIN 3.6 2.8* 3.7   Recent Labs  Lab 11/06/23 1754 11/10/23 2326 11/12/23 0832  LIPASE 158* 729* 64*   No results for input(s): AMMONIA in the last 168 hours. Coagulation Profile: No results for input(s): INR, PROTIME in the last 168 hours. Cardiac Enzymes: No results for input(s): CKTOTAL, CKMB, CKMBINDEX, TROPONINI in the last 168 hours. BNP (last 3 results) No results for input(s): PROBNP in the last 8760 hours. HbA1C: No results for input(s): HGBA1C in the last 72 hours. CBG: No results for input(s): GLUCAP in the last 168 hours.  Lipid Profile: No results for input(s): CHOL, HDL, LDLCALC, TRIG, CHOLHDL, LDLDIRECT in the last 72 hours. Thyroid  Function Tests: No results for input(s): TSH, T4TOTAL, FREET4, T3FREE, THYROIDAB in the last 72 hours. Anemia Panel: No results for input(s): VITAMINB12, FOLATE, FERRITIN, TIBC, IRON, RETICCTPCT in the last 72 hours. Urine analysis:    Component Value Date/Time   COLORURINE YELLOW 11/13/2023 1039   APPEARANCEUR CLEAR 11/13/2023 1039   APPEARANCEUR Clear 11/15/2014 1755   LABSPEC 1.011 11/13/2023 1039   LABSPEC 1.002 11/15/2014 1755   PHURINE 5.0 11/13/2023 1039   GLUCOSEU NEGATIVE 11/13/2023 1039   GLUCOSEU Negative 11/15/2014 1755   HGBUR NEGATIVE 11/13/2023 1039   BILIRUBINUR NEGATIVE 11/13/2023 1039   BILIRUBINUR Negative 11/15/2014 1755   KETONESUR NEGATIVE 11/13/2023 1039   PROTEINUR NEGATIVE 11/13/2023 1039   NITRITE NEGATIVE 11/13/2023 1039   LEUKOCYTESUR NEGATIVE 11/13/2023 1039   LEUKOCYTESUR Negative 11/15/2014 1755   Sepsis Labs: @LABRCNTIP (procalcitonin:4,lacticidven:4)  )No results found for this or any previous visit  (from the past 240 hours).    Studies: No results found.  Scheduled Meds:  bictegravir-emtricitabine -tenofovir  AF  1 tablet Oral Daily   chlordiazePOXIDE   25 mg Oral BH-qamhs   Followed by   NOREEN ON 11/14/2023] chlordiazePOXIDE   25 mg Oral Daily   enoxaparin  (LOVENOX ) injection  40 mg Subcutaneous Q24H   levETIRAcetam   500 mg Oral BID   lipase/protease/amylase  36,000 Units Oral TID AC   multivitamin with minerals  1 tablet Oral Daily   pantoprazole   40 mg Oral Daily   thiamine   100 mg Intramuscular Once    Continuous Infusions:  sodium chloride  100 mL/hr at 11/13/23 0413     LOS: 2 days     Lebron JINNY Cage, MD [facility]  If 7PM-7AM, please contact night-coverage [email or web address] 11/13/2023, 3:09 PM

## 2023-11-14 DIAGNOSIS — K852 Alcohol induced acute pancreatitis without necrosis or infection: Secondary | ICD-10-CM | POA: Diagnosis not present

## 2023-11-14 NOTE — Progress Notes (Signed)
 PROGRESS NOTE  Gary [name] FMW:[id number] DOB: 1986/02/02 DOA: 11/10/2023 PCP: Default, Provider, MD  HPI/Recap of past 24 hours: Gary [name] is a 38 y.o. male with medical history significant for HIV, polysubstance abuse, PTSD, schizophrenia, seizure disorder, alcohol  abuse and recurrent alcohol  induced pancreatitis, with multiple recurrent admissions, presented with recurrent pancreatitis. Pt was just discharged from the hospital on 11/10/23 pm and returned later that night, after being treated for left lower lobe community-acquired pneumonia.  After discharge, he went back to drinking, developed abdominal pain, and vomiting, and returned to the ER.  In the ER, lipase noted to be 729.  Patient admitted for further management.    Patient still complaining about abdominal pain.   Assessment/Plan: Principal Problem:   Acute alcoholic pancreatitis   Recurrent alcoholic pancreatitis Lipase 729--> 64, trended down Last imaging done on 10/28/23 showed severe pancreatitis, no need for repeat On soft diet, advance as tolerated Continue IV fluids Pain management, antiemetics  Hypokalemia Replace as needed  Leukopenia/normocytic anemia In the setting of possible bone marrow suppression from alcohol  versus HIV Daily CBC   Alcohol  abuse Polysubstance abuse Continues to drink alcohol  daily  History of severe alcohol  withdrawal, requiring phenobarbital  UDS positive for opiates and benzos (UDS collected while on the floor) Thiamine , folate, multivitamin Completed Librium  taper   Alcohol  induced seizure Continue Keppra    HIV Noncompliant with medication Continue Biktarvy  Advised to follow-up with ID as an outpatient and be compliant with medication     Estimated body mass index is 18.96 kg/m as calculated from the following:   Height as of 10/31/23: 6' (1.829 m).   Weight as of this encounter: 63.4 kg.     Code Status: Full  Family Communication: None at  bedside  Disposition Plan: Status is: Inpatient Remains inpatient appropriate because: Level of care      Consultants: None  Procedures: None  Antimicrobials: None  DVT prophylaxis: Lovenox    Objective: Vitals:   11/13/23 1600 11/13/23 2018 11/14/23 0519 11/14/23 1135  BP:  97/61 95/64 (!) 86/57  Pulse:  70 62 66  Resp:  17 16 16   Temp: 99.1 F (37.3 C) 99.5 F (37.5 C) 98.7 F (37.1 C) 99.2 F (37.3 C)  TempSrc: Oral Oral Oral Oral  SpO2:  98% 93% 100%  Weight:        Intake/Output Summary (Last 24 hours) at 11/14/2023 1431 Last data filed at 11/14/2023 1018 Gross per 24 hour  Intake 3310.88 ml  Output 0 ml  Net 3310.88 ml   Filed Weights   11/13/23 0610  Weight: 63.4 kg    Exam: General: NAD  Cardiovascular: S1, S2 present Respiratory: CTAB Abdomen: Soft, nontender, nondistended, bowel sounds present Musculoskeletal: No bilateral pedal edema noted Skin: Normal Psychiatry: Normal mood     Data Reviewed: CBC: Recent Labs  Lab 11/10/23 2326 11/12/23 0832 11/13/23 0607  WBC 4.4 3.1* 3.2*  NEUTROABS  --  1.4* 2.1  HGB 11.9* 10.4* 10.8*  HCT 35.0* 32.0* 33.3*  MCV 92.1 95.2 95.7  PLT 225 162 152   Basic Metabolic Panel: Recent Labs  Lab 11/08/23 0408 11/10/23 2326 11/11/23 0934 11/12/23 0832 11/13/23 0607  NA  --  138  --  135 133*  K  --  3.2*  --  3.3* 4.2  CL  --  106  --  104 103  CO2  --  24  --  21* 23  GLUCOSE  --  94  --  62* 91  BUN  --  7  --  7 5*  CREATININE  --  0.63  --  0.49* 0.58*  CALCIUM   --  9.1  --  8.7* 8.9  MG 2.0  --  1.8  --  1.7   GFR: Estimated Creatinine Clearance: 113.4 mL/min (A) (by C-G formula based on SCr of 0.58 mg/dL (L)). Liver Function Tests: Recent Labs  Lab 11/10/23 2326  AST 15  ALT 16  ALKPHOS 44  BILITOT 0.3  PROT 7.7  ALBUMIN 3.7   Recent Labs  Lab 11/10/23 2326 11/12/23 0832  LIPASE 729* 64*   No results for input(s): AMMONIA in the last 168 hours. Coagulation  Profile: No results for input(s): INR, PROTIME in the last 168 hours. Cardiac Enzymes: No results for input(s): CKTOTAL, CKMB, CKMBINDEX, TROPONINI in the last 168 hours. BNP (last 3 results) No results for input(s): PROBNP in the last 8760 hours. HbA1C: No results for input(s): HGBA1C in the last 72 hours. CBG: No results for input(s): GLUCAP in the last 168 hours.  Lipid Profile: No results for input(s): CHOL, HDL, LDLCALC, TRIG, CHOLHDL, LDLDIRECT in the last 72 hours. Thyroid  Function Tests: No results for input(s): TSH, T4TOTAL, FREET4, T3FREE, THYROIDAB in the last 72 hours. Anemia Panel: No results for input(s): VITAMINB12, FOLATE, FERRITIN, TIBC, IRON, RETICCTPCT in the last 72 hours. Urine analysis:    Component Value Date/Time   COLORURINE YELLOW 11/13/2023 1039   APPEARANCEUR CLEAR 11/13/2023 1039   APPEARANCEUR Clear 11/15/2014 1755   LABSPEC 1.011 11/13/2023 1039   LABSPEC 1.002 11/15/2014 1755   PHURINE 5.0 11/13/2023 1039   GLUCOSEU NEGATIVE 11/13/2023 1039   GLUCOSEU Negative 11/15/2014 1755   HGBUR NEGATIVE 11/13/2023 1039   BILIRUBINUR NEGATIVE 11/13/2023 1039   BILIRUBINUR Negative 11/15/2014 1755   KETONESUR NEGATIVE 11/13/2023 1039   PROTEINUR NEGATIVE 11/13/2023 1039   NITRITE NEGATIVE 11/13/2023 1039   LEUKOCYTESUR NEGATIVE 11/13/2023 1039   LEUKOCYTESUR Negative 11/15/2014 1755   Sepsis Labs: @LABRCNTIP (procalcitonin:4,lacticidven:4)  )No results found for this or any previous visit (from the past 240 hours).    Studies: No results found.  Scheduled Meds:  bictegravir-emtricitabine -tenofovir  AF  1 tablet Oral Daily   enoxaparin  (LOVENOX ) injection  40 mg Subcutaneous Q24H   levETIRAcetam   500 mg Oral BID   lipase/protease/amylase  36,000 Units Oral TID AC   multivitamin with minerals  1 tablet Oral Daily   pantoprazole   40 mg Oral Daily   thiamine   100 mg Intramuscular Once     Continuous Infusions:  sodium chloride  100 mL/hr at 11/14/23 1134     LOS: 3 days     Lebron JINNY Cage, MD [facility]  If 7PM-7AM, please contact night-coverage [email or web address] 11/14/2023, 2:31 PM

## 2023-11-15 DIAGNOSIS — K852 Alcohol induced acute pancreatitis without necrosis or infection: Secondary | ICD-10-CM | POA: Diagnosis not present

## 2023-11-15 MED ORDER — OXYCODONE HCL 5 MG PO TABS
10.0000 mg | ORAL_TABLET | Freq: Four times a day (QID) | ORAL | Status: DC | PRN
  Administered 2023-11-15 – 2023-11-16 (×3): 10 mg via ORAL
  Filled 2023-11-15 (×3): qty 2

## 2023-11-15 NOTE — Plan of Care (Signed)
   Problem: Education: Goal: Knowledge of General Education information will improve Description: Including pain rating scale, medication(s)/side effects and non-pharmacologic comfort measures Outcome: Progressing   Problem: Nutrition: Goal: Adequate nutrition will be maintained Outcome: Progressing

## 2023-11-15 NOTE — Progress Notes (Signed)
 PROGRESS NOTE  RON BESKE FMW:[id number] DOB: [birth year] DOA: 11/10/2023 PCP: Default, Provider, MD  HPI/Recap of past 24 hours: Gary [name] is a 38 y.o. male with medical history significant for HIV, polysubstance abuse, PTSD, schizophrenia, seizure disorder, alcohol  abuse and recurrent alcohol  induced pancreatitis, with multiple recurrent admissions, presented with recurrent pancreatitis. Pt was just discharged from the hospital on 11/10/23 pm and returned later that night, after being treated for left lower lobe community-acquired pneumonia.  After discharge, he went back to drinking, developed abdominal pain, and vomiting, and returned to the ER.  In the ER, lipase noted to be 729.  Patient admitted for further management.    Today, patient still with abdominal pain.   Assessment/Plan: Principal Problem:   Acute alcoholic pancreatitis   Recurrent alcoholic pancreatitis Lipase 729--> 64, trended down Last imaging done on 10/28/23 showed severe pancreatitis, no need for repeat On soft diet, advance as tolerated Continue IV fluids Pain management, antiemetics  Hypokalemia Replace as needed  Leukopenia/normocytic anemia In the setting of possible bone marrow suppression from alcohol  versus HIV Daily CBC   Alcohol  abuse Polysubstance abuse Continues to drink alcohol  daily  History of severe alcohol  withdrawal, requiring phenobarbital  UDS positive for opiates and benzos (UDS collected while on the floor) Thiamine , folate, multivitamin Completed Librium  taper   Alcohol  induced seizure Continue Keppra    HIV Noncompliant with medication Continue Biktarvy  Advised to follow-up with ID as an outpatient and be compliant with medication     Estimated body mass index is 18.63 kg/m as calculated from the following:   Height as of 10/31/23: 6' (1.829 m).   Weight as of this encounter: 62.3 kg.     Code Status: Full  Family Communication: None at  bedside  Disposition Plan: Status is: Inpatient Remains inpatient appropriate because: Level of care      Consultants: None  Procedures: None  Antimicrobials: None  DVT prophylaxis: Lovenox    Objective: Vitals:   11/15/23 0500 11/15/23 0551 11/15/23 0906 11/15/23 1258  BP:  (!) 90/55 94/65 (!) 90/56  Pulse:  (!) 56 (!) 56 66  Resp:  18 16 19   Temp:  98.4 F (36.9 C) 98.2 F (36.8 C) 98.4 F (36.9 C)  TempSrc:  Oral Oral Oral  SpO2:  97% 100% 97%  Weight: 62.3 kg       Intake/Output Summary (Last 24 hours) at 11/15/2023 1326 Last data filed at 11/15/2023 1200 Gross per 24 hour  Intake 2707.51 ml  Output --  Net 2707.51 ml   Filed Weights   11/13/23 0610 11/15/23 0500  Weight: 63.4 kg 62.3 kg    Exam: General: NAD  Cardiovascular: S1, S2 present Respiratory: CTAB Abdomen: Soft, nontender, nondistended, bowel sounds present Musculoskeletal: No bilateral pedal edema noted Skin: Normal Psychiatry: Normal mood     Data Reviewed: CBC: Recent Labs  Lab 11/10/23 2326 11/12/23 0832 11/13/23 0607  WBC 4.4 3.1* 3.2*  NEUTROABS  --  1.4* 2.1  HGB 11.9* 10.4* 10.8*  HCT 35.0* 32.0* 33.3*  MCV 92.1 95.2 95.7  PLT 225 162 152   Basic Metabolic Panel: Recent Labs  Lab 11/10/23 2326 11/11/23 0934 11/12/23 0832 11/13/23 0607  NA 138  --  135 133*  K 3.2*  --  3.3* 4.2  CL 106  --  104 103  CO2 24  --  21* 23  GLUCOSE 94  --  62* 91  BUN 7  --  7 5*  CREATININE 0.63  --  0.49* 0.58*  CALCIUM  9.1  --  8.7* 8.9  MG  --  1.8  --  1.7   GFR: Estimated Creatinine Clearance: 111.4 mL/min (A) (by C-G formula based on SCr of 0.58 mg/dL (L)). Liver Function Tests: Recent Labs  Lab 11/10/23 2326  AST 15  ALT 16  ALKPHOS 44  BILITOT 0.3  PROT 7.7  ALBUMIN 3.7   Recent Labs  Lab 11/10/23 2326 11/12/23 0832  LIPASE 729* 64*   No results for input(s): AMMONIA in the last 168 hours. Coagulation Profile: No results for input(s): INR,  PROTIME in the last 168 hours. Cardiac Enzymes: No results for input(s): CKTOTAL, CKMB, CKMBINDEX, TROPONINI in the last 168 hours. BNP (last 3 results) No results for input(s): PROBNP in the last 8760 hours. HbA1C: No results for input(s): HGBA1C in the last 72 hours. CBG: No results for input(s): GLUCAP in the last 168 hours.  Lipid Profile: No results for input(s): CHOL, HDL, LDLCALC, TRIG, CHOLHDL, LDLDIRECT in the last 72 hours. Thyroid  Function Tests: No results for input(s): TSH, T4TOTAL, FREET4, T3FREE, THYROIDAB in the last 72 hours. Anemia Panel: No results for input(s): VITAMINB12, FOLATE, FERRITIN, TIBC, IRON, RETICCTPCT in the last 72 hours. Urine analysis:    Component Value Date/Time   COLORURINE YELLOW 11/13/2023 1039   APPEARANCEUR CLEAR 11/13/2023 1039   APPEARANCEUR Clear 11/15/2014 1755   LABSPEC 1.011 11/13/2023 1039   LABSPEC 1.002 11/15/2014 1755   PHURINE 5.0 11/13/2023 1039   GLUCOSEU NEGATIVE 11/13/2023 1039   GLUCOSEU Negative 11/15/2014 1755   HGBUR NEGATIVE 11/13/2023 1039   BILIRUBINUR NEGATIVE 11/13/2023 1039   BILIRUBINUR Negative 11/15/2014 1755   KETONESUR NEGATIVE 11/13/2023 1039   PROTEINUR NEGATIVE 11/13/2023 1039   NITRITE NEGATIVE 11/13/2023 1039   LEUKOCYTESUR NEGATIVE 11/13/2023 1039   LEUKOCYTESUR Negative 11/15/2014 1755   Sepsis Labs: @LABRCNTIP (procalcitonin:4,lacticidven:4)  )No results found for this or any previous visit (from the past 240 hours).    Studies: No results found.  Scheduled Meds:  bictegravir-emtricitabine -tenofovir  AF  1 tablet Oral Daily   enoxaparin  (LOVENOX ) injection  40 mg Subcutaneous Q24H   levETIRAcetam   500 mg Oral BID   lipase/protease/amylase  36,000 Units Oral TID AC   multivitamin with minerals  1 tablet Oral Daily   pantoprazole   40 mg Oral Daily   thiamine   100 mg Intramuscular Once    Continuous Infusions:  sodium chloride  100 mL/hr  at 11/14/23 2159     LOS: 4 days     Lebron JINNY Cage, MD [facility]  If 7PM-7AM, please contact night-coverage [email or web address] 11/15/2023, 1:26 PM

## 2023-11-16 ENCOUNTER — Other Ambulatory Visit (HOSPITAL_COMMUNITY): Payer: Self-pay

## 2023-11-16 ENCOUNTER — Other Ambulatory Visit: Payer: Self-pay

## 2023-11-16 DIAGNOSIS — K852 Alcohol induced acute pancreatitis without necrosis or infection: Secondary | ICD-10-CM | POA: Diagnosis not present

## 2023-11-16 MED ORDER — OXYCODONE HCL 5 MG PO TABS
5.0000 mg | ORAL_TABLET | Freq: Four times a day (QID) | ORAL | 0 refills | Status: DC | PRN
  Filled 2023-11-16: qty 12, 3d supply, fill #0

## 2023-11-16 MED ORDER — BICTEGRAVIR-EMTRICITAB-TENOFOV 50-200-25 MG PO TABS
1.0000 | ORAL_TABLET | Freq: Every day | ORAL | 2 refills | Status: DC
  Filled 2023-11-16: qty 30, 30d supply, fill #0

## 2023-11-16 MED ORDER — PANCRELIPASE (LIP-PROT-AMYL) 36000-114000 UNITS PO CPEP
36000.0000 [IU] | ORAL_CAPSULE | Freq: Three times a day (TID) | ORAL | 0 refills | Status: DC
  Filled 2023-11-16: qty 100, 34d supply, fill #0

## 2023-11-16 MED ORDER — THIAMINE MONONITRATE 100 MG PO TABS
100.0000 mg | ORAL_TABLET | Freq: Every day | ORAL | Status: DC
Start: 2023-11-16 — End: 2023-11-16
  Administered 2023-11-16: 100 mg via ORAL
  Filled 2023-11-16: qty 1

## 2023-11-16 MED ORDER — HYDROXYZINE HCL 10 MG PO TABS
10.0000 mg | ORAL_TABLET | Freq: Three times a day (TID) | ORAL | 0 refills | Status: DC | PRN
  Filled 2023-11-16: qty 30, 10d supply, fill #0

## 2023-11-16 MED ORDER — LEVETIRACETAM 500 MG PO TABS
500.0000 mg | ORAL_TABLET | Freq: Two times a day (BID) | ORAL | 0 refills | Status: DC
  Filled 2023-11-16: qty 60, 30d supply, fill #0

## 2023-11-16 MED ORDER — FOLIC ACID 1 MG PO TABS
1.0000 mg | ORAL_TABLET | Freq: Every day | ORAL | Status: DC
  Administered 2023-11-16: 1 mg via ORAL
  Filled 2023-11-16: qty 1

## 2023-11-16 NOTE — Progress Notes (Signed)
 Patient was given discharge orders, and all questions were answered. Patient's meds were brought over from [facility], and he was stable for discharge. Patient was taken to the main exit by wheelchair where he will catch a bus.

## 2023-11-16 NOTE — Progress Notes (Signed)
 Chaplain brought Gary [name] some clothing from the clothing closet prior to his discharge.

## 2023-11-16 NOTE — Discharge Summary (Signed)
 Physician Discharge Summary   Patient: Gary [name] MRN: [id number] DOB: 06-10-1986  Admit date:     11/10/2023  Discharge date: 11/16/23  Discharge Physician: Lebron JINNY Cage   PCP: Default, Provider, MD   Recommendations at discharge:   Follow-up with PCP  Discharge Diagnoses: Principal Problem:   Acute alcoholic pancreatitis   Hospital Course: Gary [name] is a 38 y.o. male with medical history significant for HIV, polysubstance abuse, PTSD, schizophrenia, seizure disorder, alcohol  abuse and recurrent alcohol  induced pancreatitis, with multiple recurrent admissions, presented with recurrent pancreatitis. Pt was just discharged from the hospital on 11/10/23 pm and returned later that night, after being treated for left lower lobe community-acquired pneumonia.  After discharge, he went back to drinking, developed abdominal pain, and vomiting, and returned to the ER.  In the ER, lipase noted to be 729.  Patient admitted for further management.     Today, patient denies any new complaints.  Reports today, patient denies any new complaints.  Reports abdominal pain has improved but does feel bloated.  TOC discussed with patient about discharge plans, requesting bus pass to go to [facility].     Assessment and Plan:  Recurrent alcoholic pancreatitis Lipase 729--> 64, trended down Last imaging done on 10/28/23 showed severe pancreatitis, no need for repeat Discharge on some pain meds, requested medication for anxiety to enable him stop drinking Discharged on Atarax  as needed Creon    Hypokalemia Replaced as needed   Leukopenia/normocytic anemia In the setting of possible bone marrow suppression from alcohol  versus HIV   Alcohol  abuse Polysubstance abuse Continues to drink alcohol  daily  History of severe alcohol  withdrawal, requiring phenobarbital  UDS positive for opiates and benzos (UDS collected while on the floor) Completed Librium  taper Advised to abstain, provided  patient with Atarax  as needed for anxiety   Alcohol  induced seizure Continue Keppra    HIV Noncompliant with medication Continue Biktarvy  Advised to follow-up with ID as an outpatient and be compliant with medication    Consultants: None Procedures performed: None Disposition: [facility] Diet recommendation: Soft diet     DISCHARGE MEDICATION: Allergies as of 11/16/2023       Reactions   Tegretol  [carbamazepine ] Other (See Comments)   Vertigo   Caffeine Palpitations        Medication List     STOP taking these medications    thiamine  100 MG tablet Commonly known as: VITAMIN B1       TAKE these medications    Biktarvy  50-200-25 MG Tabs tablet Generic drug: bictegravir-emtricitabine -tenofovir  AF Take 1 tablet by mouth daily.   folic acid  1 MG tablet Commonly known as: FOLVITE  Take 1 tablet (1 mg total) by mouth daily.   hydrOXYzine  10 MG tablet Commonly known as: ATARAX  Take 1 tablet (10 mg total) by mouth 3 (three) times daily as needed.   levETIRAcetam  500 MG tablet Commonly known as: KEPPRA  Take 1 tablet (500 mg total) by mouth 2 (two) times daily.   lipase/protease/amylase 63999 UNITS Cpep capsule Commonly known as: CREON  Take 1 capsule (36,000 Units total) by mouth 3 (three) times daily before meals.   oxyCODONE  5 MG immediate release tablet Commonly known as: Oxy IR/ROXICODONE  Take 1 tablet (5 mg total) by mouth every 6 (six) hours as needed for up to 3 days for moderate pain (pain score 4-6).   pantoprazole  40 MG tablet Commonly known as: Protonix  Take 1 tablet (40 mg total) by mouth daily.        Discharge Exam: [employer]  11/13/23 0610 11/15/23 0500 11/16/23 0434  Weight: 63.4 kg 62.3 kg 63.9 kg   General: NAD  Cardiovascular: S1, S2 present Respiratory: CTAB Abdomen: Soft, nontender, nondistended, bowel sounds present Musculoskeletal: No bilateral pedal edema noted Skin: Normal Psychiatry: Normal mood   Condition at  discharge: stable  The results of significant diagnostics from this hospitalization (including imaging, microbiology, ancillary and laboratory) are listed below for reference.   Imaging Studies: DG Chest 2 View Result Date: 11/06/2023 CLINICAL DATA:  Hypotension. EXAM: CHEST - 2 VIEW COMPARISON:  Radiograph 8 days ago 11/01/2023 FINDINGS: Significant patient rotation. Chronic elevation of left hemidiaphragm. There is increasing patchy opacity at the left lung base. The right lung is clear. Grossly stable heart size and mediastinal contours allowing for rotation on the current exam. No pneumothorax or large pleural effusion. Normal pulmonary vasculature. IMPRESSION: 1. Increasing patchy opacity at the left lung base, suspicious for pneumonia. 2. Chronic elevation of left hemidiaphragm. Electronically Signed   By: Andrea Gasman M.D.   On: 11/06/2023 20:57   Acute Abdominal Series Result Date: 11/01/2023 CLINICAL DATA:  Nausea EXAM: DG ABDOMEN ACUTE WITH 1 VIEW CHEST COMPARISON:  09/30/2023 FINDINGS: Elevated left hemidiaphragm. Significant gaseous distention of the stomach. No confluent airspace opacities or effusions. Heart mediastinal contours within normal limits. No acute bony abnormality. IMPRESSION: No acute cardiopulmonary disease. Elevation of the left hemidiaphragm with significant gaseous distention of the stomach. Electronically Signed   By: Franky Crease M.D.   On: 11/01/2023 01:12   CT ABDOMEN PELVIS W CONTRAST Result Date: 10/29/2023 CLINICAL DATA:  Abdominal pain, emesis recent admission for alcohol  withdrawal and alcohol  pancreatitis. Drank alcohol  after leaving hospital. Pain is consistent with prior pancreatitis EXAM: CT ABDOMEN AND PELVIS WITH CONTRAST TECHNIQUE: Multidetector CT imaging of the abdomen and pelvis was performed using the standard protocol following bolus administration of intravenous contrast. RADIATION DOSE REDUCTION: This exam was performed according to the  departmental dose-optimization program which includes automated exposure control, adjustment of the mA and/or kV according to patient size and/or use of iterative reconstruction technique. CONTRAST:  OMNIPAQUE  IOHEXOL  300 MG/ML  SOLN COMPARISON:  10/16/2023 FINDINGS: Lower chest: No acute abnormality. Hepatobiliary: Hepatic steatosis. Gallbladder and biliary tree are unremarkable. Pancreas: Pancreatic atrophy. Hazy fat stranding and fluid about the pancreatic head and uncinate process. 2.1 cm pseudocyst in the uncinate process is similar to prior and 1.4 cm pseudocyst in the pancreatic head has slightly decreased in size. Areas of poor enhancement in the pancreatic head compatible with necrosis. No ductal dilation. The overall extent of pancreatitis has slightly improved compared to 10/16/2023. Spleen: Unremarkable. Adrenals/Urinary Tract: Normal adrenal glands. No urinary calculi or hydronephrosis. Diffuse bladder wall thickening. Stomach/Bowel: No bowel obstruction or bowel wall thickening. Distended stomach. Mild wall thickening about the duodenum likely reactive secondary to pancreatitis. Normal appendix. 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 No lymphadenopathy. Reproductive: Unremarkable. Other: No free intraperitoneal air. Musculoskeletal: No acute fracture. IMPRESSION: 1. Slightly improved severe pancreatitis compared to 10/16/2023. 2. 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 3. Hepatic steatosis. 4. Diffuse bladder wall thickening. Correlate with urinalysis to exclude cystitis. Electronically Signed   By: Norman Gatlin M.D.   On: 10/29/2023 00:03    Microbiology: Results for orders placed or performed during the hospital encounter of 10/31/23  Urine Culture     Status: Abnormal   Collection Time: 10/31/23  4:40 AM   Specimen: Urine, Random  Result Value Ref Range Status   Specimen Description URINE, RANDOM  Final   Special Requests   Final    NONE Reflexed from  [id number] Performed at [facility], [address], [place], [place] [address]    Culture MULTIPLE SPECIES PRESENT, SUGGEST RECOLLECTION (A)  Final   Report Status 11/02/2023 FINAL  Final    Labs: CBC: Recent Labs  Lab 11/10/23 2326 11/12/23 0832 11/13/23 0607  WBC 4.4 3.1* 3.2*  NEUTROABS  --  1.4* 2.1  HGB 11.9* 10.4* 10.8*  HCT 35.0* 32.0* 33.3*  MCV 92.1 95.2 95.7  PLT 225 162 152   Basic Metabolic Panel: Recent Labs  Lab 11/10/23 2326 11/11/23 0934 11/12/23 0832 11/13/23 0607  NA 138  --  135 133*  K 3.2*  --  3.3* 4.2  CL 106  --  104 103  CO2 24  --  21* 23  GLUCOSE 94  --  62* 91  BUN 7  --  7 5*  CREATININE 0.63  --  0.49* 0.58*  CALCIUM  9.1  --  8.7* 8.9  MG  --  1.8  --  1.7   Liver Function Tests: Recent Labs  Lab 11/10/23 2326  AST 15  ALT 16  ALKPHOS 44  BILITOT 0.3  PROT 7.7  ALBUMIN 3.7   CBG: No results for input(s): GLUCAP in the last 168 hours.  Discharge time spent: greater than 30 minutes.  Signed: Lebron JINNY Cage, MD [facility] 11/16/2023

## 2023-11-16 NOTE — TOC Transition Note (Signed)
 Transition of Care [facility]) - Discharge Note   Patient Details  Name: Gary [name] MRN: [id number] Date of Birth: June 21, 1986  Transition of [facility]) CM/SW Contact:  Alfonse JONELLE Rex, RN Phone Number: 11/16/2023, 1:24 PM   Clinical Narrative:  Met with patient at bedside to confirm discharge disposition. Patient reports he is not sure where he is going but if he can get a bus pass to take him downtown he can go to the [facility]. NCM provided bus pass to patient. No further TOC needs identified.      Final next level of care: Homeless Shelter Barriers to Discharge: Barriers Resolved   Patient Goals and CMS Choice     Choice offered to / list presented to : NA      Discharge Placement                       Discharge Plan and Services Additional resources added to the After Visit Summary for   In-house Referral: Clinical Social Work   Post Acute Care Choice: NA          DME Arranged: N/A DME Agency: NA                  Social Drivers of Health (SDOH) Interventions SDOH Screenings   Food Insecurity: Food Insecurity Present (11/11/2023)  Housing: High Risk (11/11/2023)  Transportation Needs: Unmet Transportation Needs (11/11/2023)  Utilities: At Risk (11/11/2023)  Alcohol  Screen: High Risk (07/25/2021)  Depression (PHQ2-9): Medium Risk (02/12/2021)  Financial Resource Strain: Patient Declined (10/11/2023)  Social Connections: Unknown (06/24/2022)   Received from [facility]  Stress: No Stress Concern Present (06/30/2022)   Received from [facility]  Recent Concern: Stress - Stress Concern Present (06/25/2022)   Received from [facility]  Tobacco Use: High Risk (11/06/2023)     Readmission Risk Interventions    11/12/2023   12:41 PM 11/04/2023    2:28 PM 10/25/2023    1:58 PM  Readmission Risk Prevention Plan  Transportation Screening Complete Complete Complete  Medication Review (RN Care Manager) Complete Complete Complete  PCP or  Specialist appointment within 3-5 days of discharge  Patient refused   HRI or Home Care Consult Complete Complete Complete  SW Recovery Care/Counseling Consult Complete Complete Complete  Palliative Care Screening Not Applicable Not Applicable Not Applicable  Skilled Nursing Facility Not Applicable Not Applicable Not Applicable

## 2023-11-17 ENCOUNTER — Other Ambulatory Visit: Payer: Self-pay

## 2023-11-17 ENCOUNTER — Emergency Department (HOSPITAL_COMMUNITY)
Admission: EM | Admit: 2023-11-17 | Discharge: 2023-11-18 | Disposition: A | Discharge status: Home / Self Care | Payer: MEDICAID | Attending: Emergency Medicine | Admitting: Emergency Medicine

## 2023-11-17 DIAGNOSIS — Z21 Asymptomatic human immunodeficiency virus [HIV] infection status: Secondary | ICD-10-CM | POA: Diagnosis not present

## 2023-11-17 DIAGNOSIS — Z79899 Other long term (current) drug therapy: Secondary | ICD-10-CM | POA: Insufficient documentation

## 2023-11-17 DIAGNOSIS — Z789 Other specified health status: Secondary | ICD-10-CM

## 2023-11-17 DIAGNOSIS — Y907 Blood alcohol level of 200-239 mg/100 ml: Secondary | ICD-10-CM | POA: Insufficient documentation

## 2023-11-17 DIAGNOSIS — F1090 Alcohol use, unspecified, uncomplicated: Secondary | ICD-10-CM | POA: Diagnosis not present

## 2023-11-17 DIAGNOSIS — K852 Alcohol induced acute pancreatitis without necrosis or infection: Secondary | ICD-10-CM | POA: Insufficient documentation

## 2023-11-17 DIAGNOSIS — X31XXXA Exposure to excessive natural cold, initial encounter: Secondary | ICD-10-CM | POA: Insufficient documentation

## 2023-11-17 DIAGNOSIS — R109 Unspecified abdominal pain: Secondary | ICD-10-CM | POA: Diagnosis present

## 2023-11-17 LAB — RAPID URINE DRUG SCREEN, HOSP PERFORMED
Amphetamines: NOT DETECTED
Barbiturates: NOT DETECTED
Benzodiazepines: POSITIVE — AB
Cocaine: NOT DETECTED
Opiates: NOT DETECTED
Tetrahydrocannabinol: NOT DETECTED

## 2023-11-17 LAB — CBC WITH DIFFERENTIAL/PLATELET
Abs Immature Granulocytes: 0 10*3/uL (ref 0.00–0.07)
Basophils Absolute: 0 10*3/uL (ref 0.0–0.1)
Basophils Relative: 1 %
Eosinophils Absolute: 0.3 10*3/uL (ref 0.0–0.5)
Eosinophils Relative: 5 %
HCT: 35.4 % — ABNORMAL LOW (ref 39.0–52.0)
Hemoglobin: 11.3 g/dL — ABNORMAL LOW (ref 13.0–17.0)
Immature Granulocytes: 0 %
Lymphocytes Relative: 41 %
Lymphs Abs: 2.1 10*3/uL (ref 0.7–4.0)
MCH: 30.5 pg (ref 26.0–34.0)
MCHC: 31.9 g/dL (ref 30.0–36.0)
MCV: 95.4 fL (ref 80.0–100.0)
Monocytes Absolute: 0.6 10*3/uL (ref 0.1–1.0)
Monocytes Relative: 13 %
Neutro Abs: 2 10*3/uL (ref 1.7–7.7)
Neutrophils Relative %: 40 %
Platelets: 317 10*3/uL (ref 150–400)
RBC: 3.71 MIL/uL — ABNORMAL LOW (ref 4.22–5.81)
RDW: 15.9 % — ABNORMAL HIGH (ref 11.5–15.5)
WBC: 5 10*3/uL (ref 4.0–10.5)
nRBC: 0 % (ref 0.0–0.2)

## 2023-11-17 LAB — ETHANOL: Alcohol, Ethyl (B): 207 mg/dL — ABNORMAL HIGH (ref <10)

## 2023-11-17 LAB — COMPREHENSIVE METABOLIC PANEL
ALT: 13 U/L (ref 0–44)
AST: 16 U/L (ref 15–41)
Albumin: 3.6 g/dL (ref 3.5–5.0)
Alkaline Phosphatase: 42 U/L (ref 38–126)
Anion gap: 11 (ref 5–15)
BUN: 7 mg/dL (ref 6–20)
CO2: 23 mmol/L (ref 22–32)
Calcium: 8.9 mg/dL (ref 8.9–10.3)
Chloride: 108 mmol/L (ref 98–111)
Creatinine, Ser: <0.3 mg/dL — ABNORMAL LOW (ref 0.61–1.24)
Glucose, Bld: 74 mg/dL (ref 70–99)
Potassium: 3.6 mmol/L (ref 3.5–5.1)
Sodium: 142 mmol/L (ref 135–145)
Total Bilirubin: 0.4 mg/dL (ref 0.0–1.2)
Total Protein: 7.1 g/dL (ref 6.5–8.1)

## 2023-11-17 LAB — SALICYLATE LEVEL: Salicylate Lvl: <7 mg/dL — ABNORMAL LOW (ref 7.0–30.0)

## 2023-11-17 LAB — LIPASE, BLOOD: Lipase: 376 U/L — ABNORMAL HIGH (ref 11–51)

## 2023-11-17 LAB — ACETAMINOPHEN LEVEL: Acetaminophen (Tylenol), Serum: <10 ug/mL — ABNORMAL LOW (ref 10–30)

## 2023-11-17 MED ORDER — SODIUM CHLORIDE 0.9 % IV BOLUS
1000.0000 mL | Freq: Once | INTRAVENOUS | Status: AC
  Administered 2023-11-18: 1000 mL via INTRAVENOUS

## 2023-11-17 MED ORDER — MORPHINE SULFATE (PF) 4 MG/ML IV SOLN
4.0000 mg | Freq: Once | INTRAVENOUS | Status: AC
  Administered 2023-11-18: 4 mg via INTRAVENOUS
  Filled 2023-11-17 (×2): qty 1

## 2023-11-17 MED ORDER — ONDANSETRON HCL 4 MG/2ML IJ SOLN
4.0000 mg | Freq: Once | INTRAMUSCULAR | Status: AC
  Administered 2023-11-18: 4 mg via INTRAVENOUS
  Filled 2023-11-17 (×2): qty 2

## 2023-11-17 NOTE — ED Provider Triage Note (Addendum)
 Emergency Medicine Provider Triage Evaluation Note  Gary [name] , a 38 y.o. male  was evaluated in triage.  Pt brought in by EMS complaining of abdominal pain and cold exposure.  Upon evaluation in emergency department, patient is sleeping and will grunt in response to questions.  He opens his eyes and tracks but does not verbally respond.   Upon reassessment, patient is awake complaining of pain all over and needing to urinate.  Review of Systems  Positive: Abdominal pain, cold exposure Negative: Nausea, vomiting, fevers  Physical Exam  BP 114/77   Pulse 93   Temp 97.7 F (36.5 C) (Oral)   Resp 16   Ht 6' (1.829 m)   Wt 65 kg   SpO2 99%   BMI 19.43 kg/m  Gen:   Awake, no distress   Resp:  Normal effort  MSK:   Moves extremities without difficulty  Other:    Medical Decision Making  Medically screening exam initiated at 7:53 PM.  Appropriate orders placed.  Gary [name] was informed that the remainder of the evaluation will be completed by another provider, this initial triage assessment does not replace that evaluation, and the importance of remaining in the ED until their evaluation is complete.  Labs ordered   Gary Tinnie BRAVO, PA 11/17/23 1954    Gary Tinnie BRAVO, PA 11/17/23 [phone]

## 2023-11-17 NOTE — ED Triage Notes (Signed)
 Patient BIB EMS c/o abdominal pain and cold exposure. Patient report 10/10 abdominal pain. Patient denies N/V.

## 2023-11-17 NOTE — ED Notes (Signed)
I attempted to collect labs twice and was unsuccessful 

## 2023-11-18 ENCOUNTER — Encounter (HOSPITAL_COMMUNITY): Payer: Self-pay

## 2023-11-18 ENCOUNTER — Emergency Department (HOSPITAL_COMMUNITY): Payer: MEDICAID

## 2023-11-18 ENCOUNTER — Inpatient Hospital Stay (HOSPITAL_COMMUNITY)
Admission: EM | Admit: 2023-11-18 | Discharge: 2023-11-23 | DRG: 439 | Disposition: A | Discharge status: Home / Self Care | Payer: MEDICAID | Attending: Family Medicine | Admitting: Family Medicine

## 2023-11-18 DIAGNOSIS — Z888 Allergy status to other drugs, medicaments and biological substances status: Secondary | ICD-10-CM

## 2023-11-18 DIAGNOSIS — K2970 Gastritis, unspecified, without bleeding: Secondary | ICD-10-CM | POA: Diagnosis present

## 2023-11-18 DIAGNOSIS — Z800 Family history of malignant neoplasm of digestive organs: Secondary | ICD-10-CM

## 2023-11-18 DIAGNOSIS — R569 Unspecified convulsions: Secondary | ICD-10-CM

## 2023-11-18 DIAGNOSIS — Y907 Blood alcohol level of 200-239 mg/100 ml: Secondary | ICD-10-CM | POA: Diagnosis present

## 2023-11-18 DIAGNOSIS — F1994 Other psychoactive substance use, unspecified with psychoactive substance-induced mood disorder: Secondary | ICD-10-CM | POA: Diagnosis present

## 2023-11-18 DIAGNOSIS — F17210 Nicotine dependence, cigarettes, uncomplicated: Secondary | ICD-10-CM | POA: Diagnosis present

## 2023-11-18 DIAGNOSIS — Y92009 Unspecified place in unspecified non-institutional (private) residence as the place of occurrence of the external cause: Secondary | ICD-10-CM

## 2023-11-18 DIAGNOSIS — F10239 Alcohol dependence with withdrawal, unspecified: Secondary | ICD-10-CM | POA: Diagnosis not present

## 2023-11-18 DIAGNOSIS — T426X6A Underdosing of other antiepileptic and sedative-hypnotic drugs, initial encounter: Secondary | ICD-10-CM | POA: Diagnosis present

## 2023-11-18 DIAGNOSIS — Z6820 Body mass index (BMI) 20.0-20.9, adult: Secondary | ICD-10-CM

## 2023-11-18 DIAGNOSIS — I959 Hypotension, unspecified: Secondary | ICD-10-CM | POA: Diagnosis present

## 2023-11-18 DIAGNOSIS — R64 Cachexia: Secondary | ICD-10-CM | POA: Diagnosis present

## 2023-11-18 DIAGNOSIS — G40909 Epilepsy, unspecified, not intractable, without status epilepticus: Secondary | ICD-10-CM | POA: Diagnosis present

## 2023-11-18 DIAGNOSIS — K760 Fatty (change of) liver, not elsewhere classified: Secondary | ICD-10-CM | POA: Diagnosis present

## 2023-11-18 DIAGNOSIS — K529 Noninfective gastroenteritis and colitis, unspecified: Secondary | ICD-10-CM | POA: Diagnosis not present

## 2023-11-18 DIAGNOSIS — K863 Pseudocyst of pancreas: Secondary | ICD-10-CM | POA: Diagnosis present

## 2023-11-18 DIAGNOSIS — Z91199 Patient's noncompliance with other medical treatment and regimen due to unspecified reason: Secondary | ICD-10-CM

## 2023-11-18 DIAGNOSIS — Z5982 Transportation insecurity: Secondary | ICD-10-CM

## 2023-11-18 DIAGNOSIS — K852 Alcohol induced acute pancreatitis without necrosis or infection: Principal | ICD-10-CM | POA: Diagnosis present

## 2023-11-18 DIAGNOSIS — F1020 Alcohol dependence, uncomplicated: Secondary | ICD-10-CM | POA: Diagnosis not present

## 2023-11-18 DIAGNOSIS — D72819 Decreased white blood cell count, unspecified: Secondary | ICD-10-CM | POA: Diagnosis present

## 2023-11-18 DIAGNOSIS — Z560 Unemployment, unspecified: Secondary | ICD-10-CM

## 2023-11-18 DIAGNOSIS — K2990 Gastroduodenitis, unspecified, without bleeding: Secondary | ICD-10-CM | POA: Insufficient documentation

## 2023-11-18 DIAGNOSIS — K861 Other chronic pancreatitis: Secondary | ICD-10-CM | POA: Diagnosis present

## 2023-11-18 DIAGNOSIS — F419 Anxiety disorder, unspecified: Secondary | ICD-10-CM | POA: Diagnosis present

## 2023-11-18 DIAGNOSIS — K859 Acute pancreatitis without necrosis or infection, unspecified: Principal | ICD-10-CM

## 2023-11-18 DIAGNOSIS — Z79899 Other long term (current) drug therapy: Secondary | ICD-10-CM

## 2023-11-18 DIAGNOSIS — K2980 Duodenitis without bleeding: Secondary | ICD-10-CM | POA: Diagnosis present

## 2023-11-18 DIAGNOSIS — B20 Human immunodeficiency virus [HIV] disease: Secondary | ICD-10-CM | POA: Diagnosis present

## 2023-11-18 DIAGNOSIS — E876 Hypokalemia: Secondary | ICD-10-CM | POA: Diagnosis present

## 2023-11-18 DIAGNOSIS — Z811 Family history of alcohol abuse and dependence: Secondary | ICD-10-CM

## 2023-11-18 DIAGNOSIS — Z91128 Patient's intentional underdosing of medication regimen for other reason: Secondary | ICD-10-CM

## 2023-11-18 DIAGNOSIS — Z5902 Unsheltered homelessness: Secondary | ICD-10-CM

## 2023-11-18 DIAGNOSIS — Z5941 Food insecurity: Secondary | ICD-10-CM

## 2023-11-18 DIAGNOSIS — Z21 Asymptomatic human immunodeficiency virus [HIV] infection status: Secondary | ICD-10-CM | POA: Diagnosis present

## 2023-11-18 DIAGNOSIS — D649 Anemia, unspecified: Secondary | ICD-10-CM | POA: Diagnosis present

## 2023-11-18 LAB — COMPREHENSIVE METABOLIC PANEL
ALT: 16 U/L (ref 0–44)
AST: 22 U/L (ref 15–41)
Albumin: 3.5 g/dL (ref 3.5–5.0)
Alkaline Phosphatase: 48 U/L (ref 38–126)
Anion gap: 14 (ref 5–15)
BUN: 5 mg/dL — ABNORMAL LOW (ref 6–20)
CO2: 23 mmol/L (ref 22–32)
Calcium: 8.7 mg/dL — ABNORMAL LOW (ref 8.9–10.3)
Chloride: 101 mmol/L (ref 98–111)
Creatinine, Ser: 0.76 mg/dL (ref 0.61–1.24)
GFR, Estimated: >60 mL/min (ref >60)
Glucose, Bld: 105 mg/dL — ABNORMAL HIGH (ref 70–99)
Potassium: 3.1 mmol/L — ABNORMAL LOW (ref 3.5–5.1)
Sodium: 138 mmol/L (ref 135–145)
Total Bilirubin: <0.2 mg/dL (ref 0.0–1.2)
Total Protein: 7 g/dL (ref 6.5–8.1)

## 2023-11-18 LAB — LIPASE, BLOOD: Lipase: 509 U/L — ABNORMAL HIGH (ref 11–51)

## 2023-11-18 LAB — CBC
HCT: 34.2 % — ABNORMAL LOW (ref 39.0–52.0)
Hemoglobin: 11.3 g/dL — ABNORMAL LOW (ref 13.0–17.0)
MCH: 30.7 pg (ref 26.0–34.0)
MCHC: 33 g/dL (ref 30.0–36.0)
MCV: 92.9 fL (ref 80.0–100.0)
Platelets: 326 10*3/uL (ref 150–400)
RBC: 3.68 MIL/uL — ABNORMAL LOW (ref 4.22–5.81)
RDW: 15.8 % — ABNORMAL HIGH (ref 11.5–15.5)
WBC: 3.9 10*3/uL — ABNORMAL LOW (ref 4.0–10.5)
nRBC: 0 % (ref 0.0–0.2)

## 2023-11-18 MED ORDER — ACETAMINOPHEN 500 MG PO TABS
1000.0000 mg | ORAL_TABLET | Freq: Once | ORAL | Status: AC
  Administered 2023-11-18: 1000 mg via ORAL
  Filled 2023-11-18: qty 2

## 2023-11-18 MED ORDER — LEVETIRACETAM IN NACL 1000 MG/100ML IV SOLN
1000.0000 mg | Freq: Once | INTRAVENOUS | Status: AC
  Administered 2023-11-18: 1000 mg via INTRAVENOUS
  Filled 2023-11-18: qty 100

## 2023-11-18 MED ORDER — ACETAMINOPHEN 325 MG PO TABS
650.0000 mg | ORAL_TABLET | Freq: Once | ORAL | Status: AC
Start: 2023-11-18 — End: 2023-11-18
  Administered 2023-11-18: 650 mg via ORAL
  Filled 2023-11-18: qty 2

## 2023-11-18 MED ORDER — IOHEXOL 300 MG/ML  SOLN
100.0000 mL | Freq: Once | INTRAMUSCULAR | Status: AC | PRN
  Administered 2023-11-18: 100 mL via INTRAVENOUS

## 2023-11-18 MED ORDER — FENTANYL CITRATE PF 50 MCG/ML IJ SOSY
50.0000 ug | PREFILLED_SYRINGE | Freq: Once | INTRAMUSCULAR | Status: AC
  Administered 2023-11-18: 50 ug via INTRAVENOUS
  Filled 2023-11-18: qty 1

## 2023-11-18 MED ORDER — LACTATED RINGERS IV BOLUS
1000.0000 mL | Freq: Once | INTRAVENOUS | Status: AC
  Administered 2023-11-18: 1000 mL via INTRAVENOUS

## 2023-11-18 MED ORDER — LEVETIRACETAM 500 MG PO TABS
500.0000 mg | ORAL_TABLET | Freq: Two times a day (BID) | ORAL | 0 refills | Status: DC
  Filled 2023-11-18: qty 60, 30d supply, fill #0

## 2023-11-18 MED ORDER — ONDANSETRON 4 MG PO TBDP
4.0000 mg | ORAL_TABLET | Freq: Once | ORAL | Status: AC
  Administered 2023-11-18: 4 mg via ORAL
  Filled 2023-11-18: qty 1

## 2023-11-18 MED ORDER — ONDANSETRON HCL 4 MG/2ML IJ SOLN
4.0000 mg | Freq: Once | INTRAMUSCULAR | Status: AC
  Administered 2023-11-18: 4 mg via INTRAVENOUS
  Filled 2023-11-18: qty 2

## 2023-11-18 MED ORDER — MORPHINE SULFATE (PF) 4 MG/ML IV SOLN
4.0000 mg | Freq: Once | INTRAVENOUS | Status: AC
Start: 2023-11-18 — End: 2023-11-18
  Administered 2023-11-18: 4 mg via INTRAVENOUS
  Filled 2023-11-18: qty 1

## 2023-11-18 MED ORDER — THIAMINE HCL 100 MG/ML IJ SOLN
100.0000 mg | Freq: Every day | INTRAMUSCULAR | Status: DC
  Administered 2023-11-18: 100 mg via INTRAVENOUS
  Filled 2023-11-18: qty 2

## 2023-11-18 MED ORDER — ONDANSETRON 4 MG PO TBDP
4.0000 mg | ORAL_TABLET | Freq: Three times a day (TID) | ORAL | 0 refills | Status: DC | PRN
  Filled 2023-11-18: qty 20, 7d supply, fill #0

## 2023-11-18 NOTE — Discharge Instructions (Addendum)
 You have acute on chronic pancreatitis, please come back if your symptoms worsen, I have prescribed you nausea medicine, please take Tylenol  for pain. ?                                   Outpatient Substance Abuse   Treatment- uninsured  Narcotics Anonymous 24-HOUR HELPLINE Pre-recorded for Meeting Schedules [facility] [phone] [email or web address] ALCOHOLICS ANONYMOUS [place]  Answering Service [phone] Please Note: All [place] Meetings are Non-smoking findspice.es  Alcohol  and Drug Services - Insurance: Medicaid /State funding/private insurance Methadone, suboxone/Intensive outpatient [place]   [phone] Fax: [phone] [address] [place], [place], [address] [place] [phone] Fax: [phone]    [address] [place], [place], [address] ([address] [place], [place], [place], [place], [place], [place], [place], [place]) [facility] [email or web address] Accepts State funding/Medicaid Transitional housing, Intensive Outpatient Treatment, Outpatient treatment, Veterans Services Phone: [phone] Fax: [phone] Address: [address], [place] [address]    [employer] of Care ([email or web address].[email or web address]/) Insurance: Medicaid Case Management, Administrator, Arts, Medication Management, Outpatient Therapy, Psychosocial Rehabilitation, Substance Abuse Intensive Outpatient Phone: [phone] Fax: [phone] [address], [place], [place], [address]   [facility] Medicaid, most private insurance providers Types of Program: Individual/Group Therapy, Substance Abuse Treatment Phone: [place] [phone] Fax: [phone] [address], [place], [place], [address] [place] [phone] [address], [address], [place], [address]  [facility] Medicaid Types of Program: [facility] Phone: [phone] Fax: [phone] [address] [place],  [place], [place], [address] [facility] Medicaid/state funds Crisis line [phone] [place] [phone] [place] [phone] [place] [phone]  [facility] [address]; [place], [place] [address] [phone] Substance Abuse Intensive Outpatient Program [facility] [address] [place], [place] [address] [phone]- Substance abuse treatment  [facility] Insurance: [facility], [address], sliding scale Types of Program: substance abuse treatment, transportation assistance Phone: [phone] Fax: [phone] Address: [address], [place] [address] [facility] ([email or web address]) Insurance: UHC, [place], [place], [place] Program: addiction counseling, detoxification, Phone: [phone]  Fax: [phone] Address: [address] [place], [place] [address]  [facility] (statewide facilities/programs) [address] (Medicaid/state funds) [place], [place] [address]                      [email or web address] [phone] [place]- [phone] [place]- [phone] [facility] (2 Locations) (Medicaid/state funds) --[address]  walk in 8:30-12 and 1-2:30 [place], [id number]   [facility]- [phone] --[address] [place], [place] [address]  [phone]-[phone] walk in 8:30-12 and 2-3:30  [facility] state funds/medicaid [address] [place], [place] [address] [phone] [facility] (Suboxone clinic) Medicaid/state funds [address] [place], [place] [address] [phone]    [facility]   [address] [place], [place] [address]  [phone] (24 hours) [place]- [address] [place], [place] [address]  [phone] (24 hours) [place]- [address] [place] [phone] [place]- [address] [place] [phone] [place] [address] [place] [phone] [facility]-  Medicaid and state funds   [place]- [address] [place], [place] [address] [phone] (24 hours) [place]- [address] [place], [place] [address] [phone] [phone] [place], [place] [address] [phone] (24 hours) [place] [address] [place], [place]  [address] [phone] [place]- [address] [facility] [address] [place] [phone]

## 2023-11-18 NOTE — ED Notes (Signed)
Pt tolerated water and crackers.

## 2023-11-18 NOTE — H&P (Signed)
 History and Physical    Patient: Gary [name] FMW:[id number] DOB: 1986-03-25 DOA: 11/18/2023 DOS: the patient was seen and examined on 11/19/2023 PCP: Default, Provider, MD  Patient coming from: Home  Chief Complaint:  Chief Complaint  Patient presents with   Medical problem   HPI: Gary [name] is a 38 y.o. male with medical history significant of alcoholic pancreatitis, fatty liver, alcohol  abuse, seizure disorder, HIV, polysubstance abuse, homelessness who presents with concerns of seizure and abdominal pain.  Patient evaluated earlier today in the ED and was diagnosed with pancreatitis seen on CT abdomen and pelvis.  He was able to pass p.o. challenge without difficulty and no episodes of vomiting and discharged. Later had seizure in a public bathroom. Has not taken his Keppra  for some time. Last had 2 bottles of wine before arrival to ED. Typically drinks 6 bottles daily. Denies other drug use.  Has not been taking antiviral for his HIV.   On arrival to the ED, he was afebrile, initially mildly hypotensive with BP of 94/53. CBC leukocytosis of 3.9, mild anemia with hemoglobin of 11.3.  CMP with hypokalemia 3.1, stable creatinine of 0.76, hypocalcemia of 8.7.  Lipase has upward trended from 376 yesterday to 509.  Patient was administered 2 doses of IV 50 mcg fentanyl , 1000 mg Keppra  and antiemetics but continued to have nausea and abdominal pain so hospitalist was consulted for admission.  Review of Systems: As mentioned in the history of present illness. All other systems reviewed and are negative. Past Medical History:  Diagnosis Date   Alcohol  abuse    Alcohol -induced pancreatitis 04/16/2022   Anxiety    Bipolar 2 disorder (HCC)    HIV (human immunodeficiency virus infection) (HCC)    Pancreatitis    PTSD (post-traumatic stress disorder)    Schizophrenia (HCC)    Seizures (HCC)    Subdural hematoma (HCC)    Past Surgical History:  Procedure Laterality Date    BIOPSY  04/19/2022   Procedure: BIOPSY;  Surgeon: Wilhelmenia Aloha Raddle., MD;  [facility];  Service: Gastroenterology;;   ENTEROSCOPY N/A 04/19/2022   Procedure: ENTEROSCOPY;  Surgeon: Wilhelmenia Aloha Raddle., MD;  Location[facility] ENDOSCOPY;  Service: Gastroenterology;  Laterality: N/A;   INCISION AND DRAINAGE PERIRECTAL ABSCESS N/A 09/24/2016   Procedure: IRRIGATION AND DEBRIDEMENT PERIRECTAL ABSCESS;  Surgeon: Carlin Pastel, MD;  Location: [facility] ORS;  Service: General;  Laterality: N/A;   none     Social History:  reports that he has been smoking cigarettes. He started smoking about 21 years ago. He has a 10.5 pack-year smoking history. He has never used smokeless tobacco. He reports current alcohol  use of about 105.0 standard drinks of alcohol  per week. He reports current drug use. Drugs: Methamphetamines and Cocaine.  Allergies  Allergen Reactions   Tegretol  [Carbamazepine ] Other (See Comments)    Vertigo and paralysis   Caffeine Palpitations    Family History  Problem Relation Age of Onset   Alcohol  abuse Mother    Alcohol  abuse Father    Colon cancer Other    Other Other    Cancer Other     Prior to Admission medications   Medication Sig Start Date End Date Taking? Authorizing Provider  bictegravir-emtricitabine -tenofovir  AF (BIKTARVY ) 50-200-25 MG TABS tablet Take 1 tablet by mouth daily. 11/16/23  Yes Ezenduka, Nkeiruka J, MD  hydrOXYzine  (ATARAX ) 10 MG tablet Take 1 tablet (10 mg total) by mouth 3 (three) times daily as needed. 11/16/23  Yes Ezenduka, Nkeiruka J, MD  levETIRAcetam  (KEPPRA ) 500 MG tablet Take 1 tablet (500 mg total) by mouth 2 (two) times daily. 11/16/23 12/16/23 Yes Ezenduka, Nkeiruka J, MD  levETIRAcetam  (KEPPRA ) 500 MG tablet Take 1 tablet (500 mg total) by mouth 2 (two) times daily. 11/18/23  Yes Gaetano Pac, MD  lipase/protease/amylase (CREON ) 36000 UNITS CPEP capsule Take 1 capsule (36,000 Units total) by mouth 3 (three) times daily before meals. 11/16/23  12/16/23 Yes Ezenduka, Nkeiruka J, MD  ondansetron  (ZOFRAN -ODT) 4 MG disintegrating tablet Take 1 tablet (4 mg total) by mouth every 8 (eight) hours as needed for nausea or vomiting. 11/18/23  Yes Gaetano Pac, MD  oxyCODONE  (OXY IR/ROXICODONE ) 5 MG immediate release tablet Take 1 tablet (5 mg total) by mouth every 6 (six) hours as needed for up to 3 days for moderate pain (pain score 4-6). 11/16/23 11/19/23 Yes Ezenduka, Nkeiruka J, MD  pantoprazole  (PROTONIX ) 40 MG tablet Take 1 tablet (40 mg total) by mouth daily. 11/10/23  Yes Drusilla Sabas RAMAN, MD  famotidine  (PEPCID ) 20 MG tablet Take 1 tablet (20 mg total) by mouth 2 (two) times daily. Patient not taking: Reported on 06/01/2019 05/14/19 06/02/19  [name], MD    Physical Exam: Vitals:   11/18/23 2100 11/18/23 2130 11/18/23 2251 11/19/23 0024  BP: 100/69 99/68 116/64 98/66  Pulse: 76 68 70 78  Resp: 17 16 16 12   Temp:   98.4 F (36.9 C) 98.4 F (36.9 C)  TempSrc:    Oral  SpO2: 96% 96% 95% 98%   Constitutional: NAD, cachectic, chronically ill-drowsy appearing young male lying in bed  Eyes: lids and conjunctivae normal ENMT: Mucous membranes are moist.  Neck: normal, supple Respiratory: clear to auscultation bilaterally, no wheezing, no crackles. Normal respiratory effort. No accessory muscle use.  Cardiovascular: Regular rate and rhythm, no murmurs / rubs / gallops. No extremity edema. .  Abdomen: Soft, diffuse tenderness with light palpation, no rebound tenderness, guarding or rigidity Musculoskeletal: no clubbing / cyanosis. No joint deformity upper and lower extremities.  Skin: no rashes, lesions, ulcers. No induration Neurologic: CN 2-12 grossly intact.  Psychiatric: . Alert and oriented x 3. Normal mood.   Data Reviewed:  See HPI  Assessment and Plan: * Seizure (HCC) -pt has not been compliant with Keppra  with breakthrough seizures -administered loading dose of Keppra  in ED. Continue home BID dosing.     Alcohol  induced  acute pancreatitis -evaluated in ED earlier and diagnosed with pancreatitis with worsening abdominal pain.  Pancreatitis secondary to alcohol  abuse.  CT A/P showed reactive gastritis/duodenitis/colitis.  -Continue aggressive IV fluid hydration -prn IV opioids for pain -full liquid diet for bowel rest and can advance as tolerated   Alcohol  use disorder, severe, dependence (HCC) -drinks up to 6 bottles of wine daily. Last had 2 bottles just prior to admission.  -keep on CIWA protocol -MTV and thiamine  supplement  Hypokalemia - Will give oral potassium supplementation  HIV (human immunodeficiency virus infection) (HCC) -pt has not been compliant with antiviral. Unfortunately likely will be a challenge for him to be compliant with medication or with ID follow-up as he is unhoused with alcohol  dependence.       Advance Care Planning: Full  Consults: none  Family Communication: none at bedside  Severity of Illness: The appropriate patient status for this patient is OBSERVATION. Observation status is judged to be reasonable and necessary in order to provide the required intensity of service to ensure the patient's safety. The patient's presenting symptoms, physical exam findings, and  initial radiographic and laboratory data in the context of their medical condition is felt to place them at decreased risk for further clinical deterioration. Furthermore, it is anticipated that the patient will be medically stable for discharge from the hospital within 2 midnights of admission.   Author: Alfrieda ONEIDA Pillar, DO 11/19/2023 12:51 AM  For on call review [email or web address].

## 2023-11-18 NOTE — ED Provider Notes (Addendum)
 [place] EMERGENCY DEPARTMENT AT [facility] Provider Note  HPI   Gary [name] is a 38 y.o. male patient with a PMHx of alcohol  abuse, homelessness, pancreatitis, and seizures, who is here today with concern for seizure.  Patient states he was in some art facility or museum, had a seizure in the bathroom, did not fall did not hit his head, and he went to the front desk, had them call 9 1 for him.  He states he is homeless, slept on her bridge last night.  He has not been taking his Keppra  medications as a friend is currently holding them for him.  He also is complaining of some sharp abdominal pain in the upper abdomen.  ROS Negative except as per HPI   Medical Decision Making   Upon presentation, the patient is afebrile hemodynamically stable appears well moving all fours, pupils are equal and reactive to light, no signs of trauma  Patient does have abdominal tenderness  This patient is seen in this emergency department frequently, including just earlier today, at around 1 in the morning, at that time his CT abdomen pelvis which does show similar findings of acute pancreatitis with areas of poor enhancement in the pancreatic head and uncinate process suspicious for necrosis.   we performed some blood work, given some Tylenol  and fentanyl , did Keppra  load him, give him some fluid and thiamine , I suspect his hypotension is mainly due to poor p.o. intake, as he states that he is not having any major eating or drinking over the past couple of days.  He is homeless, we checked some labs his lipase is 509 which is slightly higher than prior, he has a mild leukopenia of 3.9 hemoglobin 11.3 potassium 3.1 no major acidemia noted, his EKG showed no major signs of ischemia or arrhythmia  I went to reevaluate the patient, her he says his pain is much improved, went to push on his belly and it was a lot softer, he is tolerating p.o. intake, he does not endorse any urinary symptoms, we will  discharge the patient at this time with a prescription for Zofran .  We did discuss possible admission, however symptoms are improving he always has pancreatitis very frequently, we feel that he would be okay for discharge and he will come back if any acutely changes.     Will send home with Zofran  and Keppra   This patient will require admission ultimately, he is still having a lot of pain and nausea, requesting more pain and nausea medications, will admit the patient back to the hospital service who discharged him just few days ago with increasing pain and increased lipase level  1. Acute pancreatitis, unspecified complication status, unspecified pancreatitis type     @DISPOSITION @  Rx / DC Orders ED Discharge Orders          Ordered    ondansetron  (ZOFRAN -ODT) 4 MG disintegrating tablet  Every 8 hours PRN        11/18/23 2221             Past Medical History:  Diagnosis Date   Alcohol  abuse    Alcohol -induced pancreatitis 04/16/2022   Anxiety    Bipolar 2 disorder (HCC)    HIV (human immunodeficiency virus infection) (HCC)    Pancreatitis    PTSD (post-traumatic stress disorder)    Schizophrenia (HCC)    Seizures (HCC)    Subdural hematoma (HCC)    Past Surgical History:  Procedure Laterality Date   BIOPSY  04/19/2022   Procedure: BIOPSY;  Surgeon: Wilhelmenia Aloha Raddle., MD;  Location: [facility] ENDOSCOPY;  Service: Gastroenterology;;   ENTEROSCOPY N/A 04/19/2022   Procedure: ENTEROSCOPY;  Surgeon: Wilhelmenia Aloha Raddle., MD;  Location: [facility] ENDOSCOPY;  Service: Gastroenterology;  Laterality: N/A;   INCISION AND DRAINAGE PERIRECTAL ABSCESS N/A 09/24/2016   Procedure: IRRIGATION AND DEBRIDEMENT PERIRECTAL ABSCESS;  Surgeon: Carlin Pastel, MD;  Location: [facility] ORS;  Service: General;  Laterality: N/A;   none     Family History  Problem Relation Age of Onset   Alcohol  abuse Mother    Alcohol  abuse Father    Colon cancer Other    Other Other    Cancer Other    Social  History   Socioeconomic History   Marital status: Single    Spouse name: Not on file   Number of children: Not on file   Years of education: Not on file   Highest education level: Not on file  Occupational History   Occupation: unemployed  Tobacco Use   Smoking status: Every Day    Current packs/day: 0.50    Average packs/day: 0.5 packs/day for 21.0 years (10.5 ttl pk-yrs)    Types: Cigarettes    Start date: 2004   Smokeless tobacco: Never   Tobacco comments:    unable to smoke while incarcerated 6+ months 02/13/20  Vaping Use   Vaping status: Never Used  Substance and Sexual Activity   Alcohol  use: Yes    Alcohol /week: 105.0 standard drinks of alcohol     Types: 105 Cans of beer per week    Comment: drinks every day, all day, whatever I can get my hands on. Half a gallon of vodka 12/16   Drug use: Yes    Types: Methamphetamines, Cocaine    Comment: last used 10/22/2023   Sexual activity: Yes    Partners: Male, Male    Comment: declined condoms  03/2021  Other Topics Concern   Not on file  Social History Narrative   Currently living on the streets   Independent at baseline   Occasionally goes to the [facility]   Social Drivers of Health   Financial Resource Strain: Patient Declined (10/11/2023)   Overall Financial Resource Strain (CARDIA)    Difficulty of Paying Living Expenses: Patient declined  Food Insecurity: Food Insecurity Present (11/11/2023)   Hunger Vital Sign    Worried About Running Out of Food in the Last Year: Sometimes true    Ran Out of Food in the Last Year: Sometimes true  Transportation Needs: Unmet Transportation Needs (11/11/2023)   PRAPARE - Transportation    Lack of Transportation (Medical): Yes    Lack of Transportation (Non-Medical): Yes  Physical Activity: Not on file  Stress: No Stress Concern Present (06/30/2022)   Received from [facility], [facility]   [employer] of Occupational Health - Occupational Stress Questionnaire     Feeling of Stress : Not at all  Recent Concern: Stress - Stress Concern Present (06/25/2022)   Received from [facility] of [facility] - Occupational Stress Questionnaire    Feeling of Stress : Rather much  Social Connections: Unknown (06/24/2022)   Received from [facility], [facility]   Social Network    Social Network: Not on file  Intimate Partner Violence: Not At Risk (11/11/2023)   Humiliation, Afraid, Rape, and Kick questionnaire    Fear of Current or Ex-Partner: No    Emotionally Abused: No    Physically Abused: No  Sexually Abused: No     Physical Exam   Vitals:   11/18/23 2005 11/18/23 2100 11/18/23 2130  BP: (!) 94/53 100/69 99/68  Pulse: 75 76 68  Resp: 14 17 16   Temp: 97.9 F (36.6 C)    SpO2: 99% 96% 96%    Physical Exam Vitals and nursing note reviewed.  Constitutional:      General: He is not in acute distress.    Appearance: Normal appearance. He is well-developed. He is not ill-appearing or toxic-appearing.  HENT:     Head: Normocephalic and atraumatic.     Right Ear: External ear normal.     Left Ear: External ear normal.     Nose: Nose normal.     Mouth/Throat:     Mouth: Mucous membranes are moist.  Eyes:     Extraocular Movements: Extraocular movements intact.     Pupils: Pupils are equal, round, and reactive to light.  Cardiovascular:     Rate and Rhythm: Normal rate.     Pulses: Normal pulses.  Pulmonary:     Effort: Pulmonary effort is normal. No respiratory distress.     Breath sounds: Normal breath sounds. No stridor. No wheezing, rhonchi or rales.  Abdominal:     Palpations: Abdomen is soft.     Tenderness: There is abdominal tenderness (Epigastric abdominal tenderness). There is no right CVA tenderness or left CVA tenderness.  Musculoskeletal:        General: Normal range of motion.     Cervical back: Normal range of motion and neck supple.  Skin:    General: Skin is warm and dry.     Capillary  Refill: Capillary refill takes less than 2 seconds.  Neurological:     General: No focal deficit present.     Mental Status: He is alert and oriented to person, place, and time. Mental status is at baseline.  Psychiatric:        Mood and Affect: Mood normal.      Procedures   If procedures were preformed on this patient, they are listed below:  Procedures  The patient was seen, evaluated, and treated in conjunction with the attending physician, who voiced agreement in the care provided.  Note generated using Dragon voice dictation software and may contain dictation errors. Please contact me for any clarification or with any questions.   Electronically signed by:  Fairy Kerby Revere, M.D. (PGY-2)    Revere Fairy, MD 11/18/23 2221    Revere Fairy, MD 11/18/23 2251    Revere Fairy, MD 11/18/23 2259    Darra Fonda MATSU, MD 11/19/23 2215

## 2023-11-18 NOTE — Discharge Instructions (Signed)
 Your workup today was concerning again for acute pancreatitis.  It is extremely important that you work on alcohol  cessation.  You will continue to likely have flares of your pancreas until you are able to cease alcohol  consumption.  I have attached resource guides for local alcohol  treatment options.  Please return to the emergency department if you develop any life-threatening symptoms.

## 2023-11-18 NOTE — ED Triage Notes (Signed)
 Pt reports having an unwitnessed seizure in the art center bathroom in which he went to the front desk and asked them to call 911 for the seizure. He also has reports of upper abd pain that is consistent with his Hx of pancreatitis.    Medical vitals   [id number] VSS

## 2023-11-18 NOTE — ED Notes (Signed)
Pt was given water and graham crackers.

## 2023-11-18 NOTE — ED Provider Notes (Signed)
 [place] [facility] AT [facility] Provider Note   CSN: [id number] Arrival date & time: 11/17/23  1844     History  Chief Complaint  Patient presents with   Cold Exposure   Abdominal Pain    Gary [name] is a 38 y.o. male.  Patient with past medical history significant HIV, polysubstance abuse, alcohol  use of disorder, chronic pancreatitis, homelessness presents to the emergency department via EMS complaining of epigastric abdominal pain with nausea and cold exposure.  He denies emesis at this time.  Patient does endorse drinking earlier today.  He denies chest pain, shortness of breath.  He complains of being cold due to being homeless.   Abdominal Pain      Home Medications Prior to Admission medications   Medication Sig Start Date End Date Taking? Authorizing Provider  bictegravir-emtricitabine -tenofovir  AF (BIKTARVY ) 50-200-25 MG TABS tablet Take 1 tablet by mouth daily. 11/16/23   Ezenduka, Nkeiruka J, MD  folic acid  (FOLVITE ) 1 MG tablet Take 1 tablet (1 mg total) by mouth daily. Patient not taking: Reported on 11/11/2023 11/11/23   Drusilla Sabas RAMAN, MD  hydrOXYzine  (ATARAX ) 10 MG tablet Take 1 tablet (10 mg total) by mouth 3 (three) times daily as needed. 11/16/23   Ezenduka, Nkeiruka J, MD  levETIRAcetam  (KEPPRA ) 500 MG tablet Take 1 tablet (500 mg total) by mouth 2 (two) times daily. 11/16/23 12/16/23  Ezenduka, Nkeiruka J, MD  lipase/protease/amylase (CREON ) 36000 UNITS CPEP capsule Take 1 capsule (36,000 Units total) by mouth 3 (three) times daily before meals. 11/16/23 12/16/23  Ezenduka, Nkeiruka J, MD  oxyCODONE  (OXY IR/ROXICODONE ) 5 MG immediate release tablet Take 1 tablet (5 mg total) by mouth every 6 (six) hours as needed for up to 3 days for moderate pain (pain score 4-6). 11/16/23 11/19/23  Ezenduka, Nkeiruka J, MD  pantoprazole  (PROTONIX ) 40 MG tablet Take 1 tablet (40 mg total) by mouth daily. Patient not taking: Reported on 11/11/2023 11/10/23   Drusilla Sabas RAMAN,  MD  famotidine  (PEPCID ) 20 MG tablet Take 1 tablet (20 mg total) by mouth 2 (two) times daily. Patient not taking: Reported on 06/01/2019 05/14/19 06/02/19  [name], MD      Allergies    Tegretol  [carbamazepine ] and Caffeine    Review of Systems   Review of Systems  Gastrointestinal:  Positive for abdominal pain.    Physical Exam Updated Vital Signs BP 105/68   Pulse 60   Temp (!) 97.5 F (36.4 C) (Oral)   Resp 17   Ht 6' (1.829 m)   Wt 65 kg   SpO2 98%   BMI 19.43 kg/m  Physical Exam Vitals and nursing note reviewed.  Constitutional:      General: He is not in acute distress.    Appearance: He is well-developed.  HENT:     Head: Normocephalic and atraumatic.  Eyes:     Conjunctiva/sclera: Conjunctivae normal.  Cardiovascular:     Rate and Rhythm: Normal rate and regular rhythm.     Heart sounds: No murmur heard. Pulmonary:     Effort: Pulmonary effort is normal. No respiratory distress.     Breath sounds: Normal breath sounds.  Abdominal:     Palpations: Abdomen is soft.     Tenderness: There is abdominal tenderness in the epigastric area.  Musculoskeletal:        General: No swelling.     Cervical back: Neck supple.  Skin:    General: Skin is warm and dry.  Capillary Refill: Capillary refill takes less than 2 seconds.  Neurological:     Mental Status: He is alert.  Psychiatric:        Mood and Affect: Mood normal.     ED Results / Procedures / Treatments   Labs (all labs ordered are listed, but only abnormal results are displayed) Labs Reviewed  COMPREHENSIVE METABOLIC PANEL - Abnormal; Notable for the following components:      Result Value   Creatinine, Ser <0.30 (*)    All other components within normal limits  ETHANOL - Abnormal; Notable for the following components:   Alcohol , Ethyl (B) 207 (*)    All other components within normal limits  RAPID URINE DRUG SCREEN, HOSP PERFORMED - Abnormal; Notable for the following components:    Benzodiazepines POSITIVE (*)    All other components within normal limits  CBC WITH DIFFERENTIAL/PLATELET - Abnormal; Notable for the following components:   RBC 3.71 (*)    Hemoglobin 11.3 (*)    HCT 35.4 (*)    RDW 15.9 (*)    All other components within normal limits  SALICYLATE LEVEL - Abnormal; Notable for the following components:   Salicylate Lvl <7.0 (*)    All other components within normal limits  ACETAMINOPHEN  LEVEL - Abnormal; Notable for the following components:   Acetaminophen  (Tylenol ), Serum <10 (*)    All other components within normal limits  LIPASE, BLOOD - Abnormal; Notable for the following components:   Lipase 376 (*)    All other components within normal limits    EKG None  Radiology CT ABDOMEN PELVIS W CONTRAST Result Date: 11/18/2023 CLINICAL DATA:  Severe acute pancreatitis. Abdominal pain. History of alcoholic pancreatitis. EXAM: CT ABDOMEN AND PELVIS WITH CONTRAST TECHNIQUE: Multidetector CT imaging of the abdomen and pelvis was performed using the standard protocol following bolus administration of intravenous contrast. RADIATION DOSE REDUCTION: This exam was performed according to the departmental dose-optimization program which includes automated exposure control, adjustment of the mA and/or kV according to patient size and/or use of iterative reconstruction technique. CONTRAST:  OMNIPAQUE  IOHEXOL  300 MG/ML  SOLN COMPARISON:  10/28/2023 FINDINGS: Lower chest: Left lower lobe atelectasis or pneumonia. Hepatobiliary: Hepatic steatosis. Unremarkable gallbladder and biliary tree. Pancreas: Pancreatic atrophy. Redemonstrated inflammatory stranding about the pancreatic head. The previous 2.1 cm pseudocyst in the uncinate process is not well visualized today. Unchanged 1.4 cm pseudocyst in the pancreatic head. Similar areas of poor enhancement in the pancreatic head and uncinate process suspicious for necrosis. No pancreatic ductal dilation. The overall  appearance is similar to 10/28/2023. Spleen: Unremarkable. Adrenals/Urinary Tract: Normal adrenal glands. No urinary calculi or hydronephrosis. Unremarkable bladder. Stomach/Bowel: Normal caliber large and small bowel. Colonic wall thickening and mucosal hyperenhancement near the hepatic flexure likely reactive secondary to pancreatitis. Wall thickening of the duodenum and stomach with mucosal hyperenhancement is also favored reactive. Normal appendix. 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 Ill-defined filling defects in the superior mesenteric vein are favored due to mixing artifact. Thrombus is considered less likely though not excluded without delayed phase. No lymphadenopathy. Normal caliber aorta. Reproductive: Unremarkable. Other: Peripancreatic free fluid and free fluid in the pelvis. No organized fluid collection or abscess. No free intraperitoneal air. Musculoskeletal: No acute fracture. IMPRESSION: 1. Similar findings of acute pancreatitis with areas of poor enhancement in the pancreatic head and uncinate process suspicious for necrosis. 2. Reactive duodenitis, gastritis, and colitis near the hepatic flexure. 3. The previous thrombus in the  portal vein is no longer visualized. 4. Left lower lobe atelectasis or pneumonia. 5. Hepatic steatosis. Electronically Signed   By: Norman Gatlin M.D.   On: 11/18/2023 01:25    Procedures Procedures    Medications Ordered in ED Medications  morphine  (PF) 4 MG/ML injection 4 mg (4 mg Intravenous Given 11/18/23 0002)  ondansetron  (ZOFRAN ) injection 4 mg (4 mg Intravenous Given 11/18/23 0002)  sodium chloride  0.9 % bolus 1,000 mL (0 mLs Intravenous Stopped 11/18/23 0410)  iohexol  (OMNIPAQUE ) 300 MG/ML solution 100 mL (100 mLs Intravenous Contrast Given 11/18/23 0100)  acetaminophen  (TYLENOL ) tablet 650 mg (650 mg Oral Given 11/18/23 0427)  ondansetron  (ZOFRAN ) injection 4 mg (4 mg Intravenous Given 11/18/23 0435)   morphine  (PF) 4 MG/ML injection 4 mg (4 mg Intravenous Given 11/18/23 9547)    ED Course/ Medical Decision Making/ A&P                                 Medical Decision Making Amount and/or Complexity of Data Reviewed Radiology: ordered.  Risk OTC drugs. Prescription drug management.   This patient presents to the ED for concern of cold exposure and abdominal pain, this involves an extensive number of treatment options, and is a complaint that carries with it a high risk of complications and morbidity.  The differential diagnosis includes hypothermia, pancreatitis, appendicitis, others   Co morbidities that complicate the patient evaluation  Chronic pancreatitis, alcohol  abuse   Additional history obtained:  Additional history obtained from EMS External records from outside source obtained and reviewed including previous discharge summary   Lab Tests:  I Ordered, and personally interpreted labs.  The pertinent results include: Lipase 376, ethanol 207, hemoglobin 11.3   Imaging Studies ordered:  I ordered imaging studies including CT abdomen pelvis with contrast I independently visualized and interpreted imaging which showed  1. Similar findings of acute pancreatitis with areas of poor  enhancement in the pancreatic head and uncinate process suspicious  for necrosis.  2. Reactive duodenitis, gastritis, and colitis near the hepatic  flexure.  3. 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  4. Left lower lobe atelectasis or pneumonia.  5. Hepatic steatosis.   I agree with the radiologist interpretation    Problem List / ED Course / Critical interventions / Medication management   I ordered medication including morphine  for pain, Zofran  for nausea, saline bolus for fluid resuscitation, Tylenol  for body aches Reevaluation of the patient after these medicines showed that the patient improved I have reviewed the patients home medicines and have  made adjustments as needed   Social Determinants of Health:  Patient is homeless, daily cigarette smoker, alcohol  use disorder   Test / Admission - Considered:  Patient feeling better after medication able to pass p.o. challenge without difficulty.  No episodes of vomiting noted.  Patient does still have findings consistent with pancreatitis but patient also endorses continuing to drink as an outpatient.  He was prescribed medications just 2 days ago to assist with alcohol  cessation when he was discharged from the hospital after being admitted for acute pancreatitis.  The patient voices understanding with need to work on alcohol  cessation but does not endorse intent to follow through at this time.  Patient advised to seek local resources for substance abuse to assist him with alcohol  cessation as to avoid cold turkey stoppage which would likely lead to withdrawal in this patient.  At this time patient is stable for discharge.  Strict return precautions provided.         Final Clinical Impression(s) / ED Diagnoses Final diagnoses:  Alcohol -induced acute pancreatitis, unspecified complication status  Alcohol  use    Rx / DC Orders ED Discharge Orders     None         Logan Ubaldo KATHEE DEVONNA 11/18/23 0522    Bari Charmaine FALCON, MD 11/18/23 [phone]

## 2023-11-19 ENCOUNTER — Other Ambulatory Visit: Payer: Self-pay

## 2023-11-19 DIAGNOSIS — R569 Unspecified convulsions: Secondary | ICD-10-CM | POA: Diagnosis not present

## 2023-11-19 DIAGNOSIS — K2990 Gastroduodenitis, unspecified, without bleeding: Secondary | ICD-10-CM | POA: Insufficient documentation

## 2023-11-19 LAB — URINALYSIS, ROUTINE W REFLEX MICROSCOPIC
Bilirubin Urine: NEGATIVE
Glucose, UA: NEGATIVE mg/dL
Hgb urine dipstick: NEGATIVE
Ketones, ur: NEGATIVE mg/dL
Leukocytes,Ua: NEGATIVE
Nitrite: NEGATIVE
Protein, ur: NEGATIVE mg/dL
Specific Gravity, Urine: 1.012 (ref 1.005–1.030)
pH: 6 (ref 5.0–8.0)

## 2023-11-19 LAB — CBC
HCT: 30.6 % — ABNORMAL LOW (ref 39.0–52.0)
Hemoglobin: 10.2 g/dL — ABNORMAL LOW (ref 13.0–17.0)
MCH: 30.7 pg (ref 26.0–34.0)
MCHC: 33.3 g/dL (ref 30.0–36.0)
MCV: 92.2 fL (ref 80.0–100.0)
Platelets: 270 10*3/uL (ref 150–400)
RBC: 3.32 MIL/uL — ABNORMAL LOW (ref 4.22–5.81)
RDW: 15.8 % — ABNORMAL HIGH (ref 11.5–15.5)
WBC: 3.4 10*3/uL — ABNORMAL LOW (ref 4.0–10.5)
nRBC: 0 % (ref 0.0–0.2)

## 2023-11-19 LAB — BASIC METABOLIC PANEL
Anion gap: 10 (ref 5–15)
BUN: <5 mg/dL — ABNORMAL LOW (ref 6–20)
CO2: 24 mmol/L (ref 22–32)
Calcium: 8.3 mg/dL — ABNORMAL LOW (ref 8.9–10.3)
Chloride: 107 mmol/L (ref 98–111)
Creatinine, Ser: 0.57 mg/dL — ABNORMAL LOW (ref 0.61–1.24)
GFR, Estimated: >60 mL/min (ref >60)
Glucose, Bld: 86 mg/dL (ref 70–99)
Potassium: 3.6 mmol/L (ref 3.5–5.1)
Sodium: 141 mmol/L (ref 135–145)

## 2023-11-19 MED ORDER — LORAZEPAM 1 MG PO TABS
1.0000 mg | ORAL_TABLET | ORAL | Status: DC | PRN
  Administered 2023-11-19 – 2023-11-21 (×8): 1 mg via ORAL
  Filled 2023-11-19 (×9): qty 1

## 2023-11-19 MED ORDER — POTASSIUM CHLORIDE CRYS ER 20 MEQ PO TBCR
40.0000 meq | EXTENDED_RELEASE_TABLET | Freq: Once | ORAL | Status: AC
  Administered 2023-11-19: 40 meq via ORAL
  Filled 2023-11-19: qty 2

## 2023-11-19 MED ORDER — SODIUM CHLORIDE 0.9 % IV SOLN: INTRAVENOUS | Status: AC

## 2023-11-19 MED ORDER — ADULT MULTIVITAMIN W/MINERALS CH
1.0000 | ORAL_TABLET | Freq: Every day | ORAL | Status: DC
  Administered 2023-11-19 – 2023-11-23 (×5): 1 via ORAL
  Filled 2023-11-19 (×5): qty 1

## 2023-11-19 MED ORDER — OXYCODONE HCL 5 MG PO TABS
5.0000 mg | ORAL_TABLET | ORAL | Status: DC | PRN
  Administered 2023-11-19 – 2023-11-20 (×5): 5 mg via ORAL
  Filled 2023-11-19 (×5): qty 1

## 2023-11-19 MED ORDER — MORPHINE SULFATE (PF) 2 MG/ML IV SOLN: 2.0000 mg | INTRAVENOUS | Status: DC | PRN

## 2023-11-19 MED ORDER — LORAZEPAM 2 MG/ML IJ SOLN: 1.0000 mg | INTRAMUSCULAR | Status: DC | PRN

## 2023-11-19 MED ORDER — OXYCODONE HCL 5 MG PO TABS
5.0000 mg | ORAL_TABLET | Freq: Once | ORAL | Status: AC
  Administered 2023-11-19: 5 mg via ORAL
  Filled 2023-11-19: qty 1

## 2023-11-19 MED ORDER — BICTEGRAVIR-EMTRICITAB-TENOFOV 50-200-25 MG PO TABS: 1.0000 | ORAL_TABLET | Freq: Every day | ORAL | Status: DC

## 2023-11-19 MED ORDER — HYDROMORPHONE HCL 1 MG/ML IJ SOLN
1.0000 mg | INTRAMUSCULAR | Status: DC | PRN
  Administered 2023-11-19 (×2): 1 mg via INTRAVENOUS
  Filled 2023-11-19 (×2): qty 1

## 2023-11-19 MED ORDER — FOLIC ACID 1 MG PO TABS
1.0000 mg | ORAL_TABLET | Freq: Every day | ORAL | Status: DC
  Administered 2023-11-19 – 2023-11-23 (×5): 1 mg via ORAL
  Filled 2023-11-19 (×5): qty 1

## 2023-11-19 MED ORDER — ONDANSETRON HCL 4 MG/2ML IJ SOLN
4.0000 mg | Freq: Four times a day (QID) | INTRAMUSCULAR | Status: DC | PRN
  Administered 2023-11-21 – 2023-11-22 (×4): 4 mg via INTRAVENOUS
  Filled 2023-11-19 (×5): qty 2

## 2023-11-19 MED ORDER — THIAMINE MONONITRATE 100 MG PO TABS
100.0000 mg | ORAL_TABLET | Freq: Every day | ORAL | Status: DC
  Administered 2023-11-19 – 2023-11-23 (×5): 100 mg via ORAL
  Filled 2023-11-19 (×5): qty 1

## 2023-11-19 MED ORDER — ENOXAPARIN SODIUM 40 MG/0.4ML IJ SOSY
40.0000 mg | PREFILLED_SYRINGE | Freq: Every day | INTRAMUSCULAR | Status: DC
  Filled 2023-11-19 (×3): qty 0.4

## 2023-11-19 MED ORDER — LEVETIRACETAM 500 MG PO TABS
500.0000 mg | ORAL_TABLET | Freq: Two times a day (BID) | ORAL | Status: DC
  Administered 2023-11-19 – 2023-11-23 (×10): 500 mg via ORAL
  Filled 2023-11-19 (×10): qty 1

## 2023-11-19 NOTE — Assessment & Plan Note (Addendum)
 Noncompliant with Keppra.   - Continue Keppra

## 2023-11-19 NOTE — Assessment & Plan Note (Addendum)
 Noncompliant with antiviral.  HIV RNA 4520 last Dec, CD4 ct 500s.   Discussed with ID.  Given he is truly unreliable in taking Biktarvy, it should be held. - Outpatient follow up

## 2023-11-19 NOTE — Assessment & Plan Note (Signed)
Due to alcohol °

## 2023-11-19 NOTE — Hospital Course (Addendum)
 38 y.o. male with medical history significant of alcoholic pancreatitis, fatty liver, alcohol  abuse 2+ bottles wine daily, seizure disorder, hx portal vein thrombosis, HIV, polysubstance abuse, homelessness who presented with ongoing abdominal pain, possible seizure.  In the ER, CT abdomen confirmed pancreatitis, lipase was up to 509.

## 2023-11-19 NOTE — Plan of Care (Signed)

## 2023-11-19 NOTE — Assessment & Plan Note (Addendum)
 -  Supp K

## 2023-11-19 NOTE — Progress Notes (Signed)
  Progress Note   Patient: Gary [name] FMW:[id number] DOB: Oct 21, 1986 DOA: 11/18/2023     0 DOS: the patient was seen and examined on 11/19/2023 at 10:35 AM      Brief hospital course: 38 y.o. male with medical history significant of alcoholic pancreatitis, fatty liver, alcohol  abuse 2+ bottles wine daily, seizure disorder, hx portal vein thrombosis, HIV, polysubstance abuse, homelessness who presented with ongoing abdominal pain, possible seizure.  In the ER, CT abdomen confirmed pancreatitis, lipase was up to 509.       Assessment and Plan: * Seizure (HCC) Noncompliant with Keppra .  Also in withdrawal. - Resume Keppra     Alcohol  induced acute pancreatitis - IV fluids - CLD - Trend Lipase     Alcohol  use disorder, severe, dependence (HCC) Drinks several bottles of wine. EtOH 207 at admission.  Had seizure prior to admission.  High risk for complicated withdrawal. - Continue CIWA protocol with Ativan  - Folate, MVI and thiamine  supplement  Gastritis and duodenitis Due to alcohol   Hypokalemia - Supp K  HIV (human immunodeficiency virus infection) (HCC) Noncompliant with antiviral.  HIV RNA 4520 last Dec, CD4 ct 500s.   Discussed with ID.  Given he is truly unreliable in taking Biktarvy , it should be held. - Outpatient follow up          Subjective: Patient is having epigastric pain, this waxes and wanes, and is about like his baseline.  He has had no further seizures.  He ate grits this morning, had pain afterwards, had no vomiting, no fever.       Physical Exam: BP 90/69 (BP Location: Right Arm)   Pulse 81   Temp 98 F (36.7 C)   Resp 18   Ht 6' (1.829 m)   Wt 68 kg   SpO2 97%   BMI 20.34 kg/m   Thin adult male, lying in bed, interactive and appropriate RRR, no murmurs, no peripheral edema Respiratory rate normal, lungs clear without rales or wheezes Abdomen with guarding throughout Attention normal, affect normal, judgment insight appear  impaired but at baseline    Data Reviewed: Urinalysis negative Lipase up to 509 Basic metabolic panel normal White blood cell 3.4, hemoglobin 10.2    Family Communication:             Author: Lonni SHAUNNA Dalton, MD 11/19/2023 2:10 PM  For on call review [email or web address].

## 2023-11-19 NOTE — Assessment & Plan Note (Addendum)
-   Advance to full liquid diet - Continue analgesics and antiemetics - ADAT

## 2023-11-19 NOTE — ED Notes (Signed)
 ED TO INPATIENT HANDOFF REPORT  ED Nurse Name and Phone #: [phone]  S Name/Age/Gender Gary [name] 38 y.o. male Room/Bed: 037C/037C  Code Status   Code Status: Full Code  Home/SNF/Other Homeless Patient oriented to: self, place, time, and situation Is this baseline? Yes   Triage Complete: Triage complete  Chief Complaint Seizure [facility]) [R56.9]  Triage Note Pt reports having an unwitnessed seizure in the art center bathroom in which he went to the front desk and asked them to call 911 for the seizure. He also has reports of upper abd pain that is consistent with his Hx of pancreatitis.    Medical vitals   137bgl VSS   Allergies Allergies  Allergen Reactions   Tegretol  [Carbamazepine ] Other (See Comments)    Vertigo and paralysis   Caffeine Palpitations    Level of Care/Admitting Diagnosis ED Disposition     ED Disposition  Admit   Condition  --   Comment  Hospital Area: [facility] [[id number]]  Level of Care: Telemetry Medical [104]  May place patient in observation at [facility] or [facility] if equivalent level of care is available:: No  Covid Evaluation: Asymptomatic - no recent exposure (last 10 days) testing not required  Diagnosis: Seizure [facility]) [205090]  Admitting Physician: RICKY ALFRIEDA DASEN [[id number]]  Attending Physician: RICKY ALFRIEDA DASEN [[id number]]          B Medical/Surgery History Past Medical History:  Diagnosis Date   Alcohol  abuse    Alcohol -induced pancreatitis 04/16/2022   Anxiety    Bipolar 2 disorder (HCC)    HIV (human immunodeficiency virus infection) (HCC)    Pancreatitis    PTSD (post-traumatic stress disorder)    Schizophrenia (HCC)    Seizures (HCC)    Subdural hematoma (HCC)    Past Surgical History:  Procedure Laterality Date   BIOPSY  04/19/2022   Procedure: BIOPSY;  Surgeon: Wilhelmenia Aloha Raddle., MD;  Location: [facility] ENDOSCOPY;  Service: Gastroenterology;;   ENTEROSCOPY N/A 04/19/2022   Procedure:  ENTEROSCOPY;  Surgeon: Wilhelmenia Aloha Raddle., MD;  Location: [facility] ENDOSCOPY;  Service: Gastroenterology;  Laterality: N/A;   INCISION AND DRAINAGE PERIRECTAL ABSCESS N/A 09/24/2016   Procedure: IRRIGATION AND DEBRIDEMENT PERIRECTAL ABSCESS;  Surgeon: Carlin Pastel, MD;  Location: [facility] ORS;  Service: General;  Laterality: N/A;   none       A IV Location/Drains/Wounds Patient Lines/Drains/Airways Status     Active Line/Drains/Airways     Name Placement date Placement time Site Days   Peripheral IV 11/18/23 Anterior;Right Forearm 11/18/23  2335  Forearm  1            Intake/Output Last 24 hours  Intake/Output Summary (Last 24 hours) at 11/19/2023 9176 Last data filed at 11/18/2023 2210 Gross per 24 hour  Intake 1099 ml  Output --  Net 1099 ml    Labs/Imaging Results for orders placed or performed during the hospital encounter of 11/18/23 (from the past 48 hours)  Lipase, blood     Status: Abnormal   Collection Time: 11/18/23  8:20 PM  Result Value Ref Range   Lipase 509 (H) 11 - 51 U/L    Comment: RESULTS CONFIRMED BY MANUAL DILUTION Performed at [facility], [address], [place], [place] [address]   Comprehensive metabolic panel     Status: Abnormal   Collection Time: 11/18/23  8:20 PM  Result Value Ref Range   Sodium 138 135 - 145 mmol/L   Potassium 3.1 (L) 3.5 -  5.1 mmol/L   Chloride 101 98 - 111 mmol/L   CO2 23 22 - 32 mmol/L   Glucose, Bld 105 (H) 70 - 99 mg/dL    Comment: Glucose reference range applies only to samples taken after fasting for at least 8 hours.   BUN 5 (L) 6 - 20 mg/dL   Creatinine, Ser 9.23 0.61 - 1.24 mg/dL   Calcium  8.7 (L) 8.9 - 10.3 mg/dL   Total Protein 7.0 6.5 - 8.1 g/dL   Albumin 3.5 3.5 - 5.0 g/dL   AST 22 15 - 41 U/L   ALT 16 0 - 44 U/L   Alkaline Phosphatase 48 38 - 126 U/L   Total Bilirubin <0.2 0.0 - 1.2 mg/dL   GFR, Estimated >39 >39 mL/min    Comment: (NOTE) Calculated using the CKD-EPI Creatinine Equation (2021)     Anion gap 14 5 - 15    Comment: Performed at [facility], [address], [place], [place] [address]  CBC     Status: Abnormal   Collection Time: 11/18/23  8:20 PM  Result Value Ref Range   WBC 3.9 (L) 4.0 - 10.5 K/uL   RBC 3.68 (L) 4.22 - 5.81 MIL/uL   Hemoglobin 11.3 (L) 13.0 - 17.0 g/dL   HCT 65.7 (L) 60.9 - 47.9 %   MCV 92.9 80.0 - 100.0 fL   MCH 30.7 26.0 - 34.0 pg   MCHC 33.0 30.0 - 36.0 g/dL   RDW 84.1 (H) 88.4 - 84.4 %   Platelets 326 150 - 400 K/uL   nRBC 0.0 0.0 - 0.2 %    Comment: Performed at [facility], [address], [place], [place] [address]  CBC     Status: Abnormal   Collection Time: 11/19/23  4:55 AM  Result Value Ref Range   WBC 3.4 (L) 4.0 - 10.5 K/uL   RBC 3.32 (L) 4.22 - 5.81 MIL/uL   Hemoglobin 10.2 (L) 13.0 - 17.0 g/dL   HCT 69.3 (L) 60.9 - 47.9 %   MCV 92.2 80.0 - 100.0 fL   MCH 30.7 26.0 - 34.0 pg   MCHC 33.3 30.0 - 36.0 g/dL   RDW 84.1 (H) 88.4 - 84.4 %   Platelets 270 150 - 400 K/uL   nRBC 0.0 0.0 - 0.2 %    Comment: Performed at [facility], [address], [place], [place] [address]  Basic metabolic panel     Status: Abnormal   Collection Time: 11/19/23  4:55 AM  Result Value Ref Range   Sodium 141 135 - 145 mmol/L   Potassium 3.6 3.5 - 5.1 mmol/L   Chloride 107 98 - 111 mmol/L   CO2 24 22 - 32 mmol/L   Glucose, Bld 86 70 - 99 mg/dL    Comment: Glucose reference range applies only to samples taken after fasting for at least 8 hours.   BUN <5 (L) 6 - 20 mg/dL   Creatinine, Ser 9.42 (L) 0.61 - 1.24 mg/dL   Calcium  8.3 (L) 8.9 - 10.3 mg/dL   GFR, Estimated >39 >39 mL/min    Comment: (NOTE) Calculated using the CKD-EPI Creatinine Equation (2021)    Anion gap 10 5 - 15    Comment: Performed at [facility], [address], [place], [place] [address]  Urinalysis, Routine w reflex microscopic -Urine, Clean Catch     Status: None   Collection Time: 11/19/23  6:44 AM  Result Value Ref  Range   Color, Urine YELLOW  YELLOW   APPearance CLEAR CLEAR   Specific Gravity, Urine 1.012 1.005 - 1.030   pH 6.0 5.0 - 8.0   Glucose, UA NEGATIVE NEGATIVE mg/dL   Hgb urine dipstick NEGATIVE NEGATIVE   Bilirubin Urine NEGATIVE NEGATIVE   Ketones, ur NEGATIVE NEGATIVE mg/dL   Protein, ur NEGATIVE NEGATIVE mg/dL   Nitrite NEGATIVE NEGATIVE   Leukocytes,Ua NEGATIVE NEGATIVE    Comment: Performed at [facility], [address], [place], [place] [address]   CT ABDOMEN PELVIS W CONTRAST Result Date: 11/18/2023 CLINICAL DATA:  Severe acute pancreatitis. Abdominal pain. History of alcoholic pancreatitis. EXAM: CT ABDOMEN AND PELVIS WITH CONTRAST TECHNIQUE: Multidetector CT imaging of the abdomen and pelvis was performed using the standard protocol following bolus administration of intravenous contrast. RADIATION DOSE REDUCTION: This exam was performed according to the departmental dose-optimization program which includes automated exposure control, adjustment of the mA and/or kV according to patient size and/or use of iterative reconstruction technique. CONTRAST:  OMNIPAQUE  IOHEXOL  300 MG/ML  SOLN COMPARISON:  10/28/2023 FINDINGS: Lower chest: Left lower lobe atelectasis or pneumonia. Hepatobiliary: Hepatic steatosis. Unremarkable gallbladder and biliary tree. Pancreas: Pancreatic atrophy. Redemonstrated inflammatory stranding about the pancreatic head. The previous 2.1 cm pseudocyst in the uncinate process is not well visualized today. Unchanged 1.4 cm pseudocyst in the pancreatic head. Similar areas of poor enhancement in the pancreatic head and uncinate process suspicious for necrosis. No pancreatic ductal dilation. The overall appearance is similar to 10/28/2023. Spleen: Unremarkable. Adrenals/Urinary Tract: Normal adrenal glands. No urinary calculi or hydronephrosis. Unremarkable bladder. Stomach/Bowel: Normal caliber large and small bowel. Colonic wall thickening and mucosal hyperenhancement near the hepatic flexure  likely reactive secondary to pancreatitis. Wall thickening of the duodenum and stomach with mucosal hyperenhancement is also favored reactive. Normal appendix. 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 Ill-defined filling defects in the superior mesenteric vein are favored due to mixing artifact. Thrombus is considered less likely though not excluded without delayed phase. No lymphadenopathy. Normal caliber aorta. Reproductive: Unremarkable. Other: Peripancreatic free fluid and free fluid in the pelvis. No organized fluid collection or abscess. No free intraperitoneal air. Musculoskeletal: No acute fracture. IMPRESSION: 1. Similar findings of acute pancreatitis with areas of poor enhancement in the pancreatic head and uncinate process suspicious for necrosis. 2. Reactive duodenitis, gastritis, and colitis near the hepatic flexure. 3. 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 4. Left lower lobe atelectasis or pneumonia. 5. Hepatic steatosis. Electronically Signed   By: Norman Gatlin M.D.   On: 11/18/2023 01:25    Pending Labs Unresulted Labs (From admission, onward)    None       Vitals/Pain Today's Vitals   11/19/23 0814 11/19/23 0815 11/19/23 0817 11/19/23 0820  BP:  103/73 97/70   Pulse:  73 75   Resp:  (!) 21 (!) 25   Temp:   98.5 F (36.9 C)   TempSrc:   Oral   SpO2:  100%  100%  Weight:      Height:      PainSc: 9        Isolation Precautions No active isolations  Medications Medications  enoxaparin  (LOVENOX ) injection 40 mg (has no administration in time range)  ondansetron  (ZOFRAN ) injection 4 mg (has no administration in time range)  morphine  (PF) 2 MG/ML injection 2 mg (has no administration in time range)  HYDROmorphone  (DILAUDID ) injection 1 mg (1 mg  Intravenous Given 11/19/23 0813)  0.9 %  sodium chloride  infusion ( Intravenous New Bag/Given 11/19/23 0139)  LORazepam  (ATIVAN ) tablet 1-4 mg (has no  administration in time range)    Or  LORazepam  (ATIVAN ) injection 1-4 mg (has no administration in time range)  thiamine  (VITAMIN B1) tablet 100 mg (has no administration in time range)  folic acid  (FOLVITE ) tablet 1 mg (has no administration in time range)  multivitamin with minerals tablet 1 tablet (has no administration in time range)  levETIRAcetam  (KEPPRA ) tablet 500 mg (500 mg Oral Given 11/19/23 0131)  lactated ringers  bolus 1,000 mL (0 mLs Intravenous Stopped 11/18/23 2210)  fentaNYL  (SUBLIMAZE ) injection 50 mcg (50 mcg Intravenous Given 11/18/23 2041)  acetaminophen  (TYLENOL ) tablet 1,000 mg (1,000 mg Oral Given 11/18/23 2042)  ondansetron  (ZOFRAN ) injection 4 mg (4 mg Intravenous Given 11/18/23 2042)  levETIRAcetam  (KEPPRA ) IVPB 1000 mg/100 mL premix (0 mg Intravenous Stopped 11/18/23 2205)  ondansetron  (ZOFRAN -ODT) disintegrating tablet 4 mg (4 mg Oral Given 11/18/23 2253)  fentaNYL  (SUBLIMAZE ) injection 50 mcg (50 mcg Intravenous Given 11/18/23 2340)  potassium chloride  SA (KLOR-CON  M) CR tablet 40 mEq (40 mEq Oral Given 11/19/23 0131)    Mobility walks with person assist     Focused Assessments GI   R Recommendations: See Admitting Provider Note  Report given to:   Additional Notes:

## 2023-11-19 NOTE — Progress Notes (Signed)
 CSW added substance abuse resources to patient's AVS.  Edwin Dada, MSW, LCSW Transitions of Care  Clinical Social Worker II [phone]

## 2023-11-19 NOTE — Assessment & Plan Note (Addendum)
 Improving - Continue vitamins - Continue CIWA scoring

## 2023-11-20 DIAGNOSIS — Z888 Allergy status to other drugs, medicaments and biological substances status: Secondary | ICD-10-CM | POA: Diagnosis not present

## 2023-11-20 DIAGNOSIS — D649 Anemia, unspecified: Secondary | ICD-10-CM | POA: Diagnosis present

## 2023-11-20 DIAGNOSIS — Z91199 Patient's noncompliance with other medical treatment and regimen due to unspecified reason: Secondary | ICD-10-CM | POA: Diagnosis not present

## 2023-11-20 DIAGNOSIS — K863 Pseudocyst of pancreas: Secondary | ICD-10-CM | POA: Diagnosis present

## 2023-11-20 DIAGNOSIS — T426X6A Underdosing of other antiepileptic and sedative-hypnotic drugs, initial encounter: Secondary | ICD-10-CM | POA: Diagnosis present

## 2023-11-20 DIAGNOSIS — F17210 Nicotine dependence, cigarettes, uncomplicated: Secondary | ICD-10-CM | POA: Diagnosis present

## 2023-11-20 DIAGNOSIS — K852 Alcohol induced acute pancreatitis without necrosis or infection: Secondary | ICD-10-CM | POA: Diagnosis present

## 2023-11-20 DIAGNOSIS — K861 Other chronic pancreatitis: Secondary | ICD-10-CM | POA: Diagnosis present

## 2023-11-20 DIAGNOSIS — F419 Anxiety disorder, unspecified: Secondary | ICD-10-CM | POA: Diagnosis present

## 2023-11-20 DIAGNOSIS — R64 Cachexia: Secondary | ICD-10-CM | POA: Diagnosis present

## 2023-11-20 DIAGNOSIS — F1994 Other psychoactive substance use, unspecified with psychoactive substance-induced mood disorder: Secondary | ICD-10-CM | POA: Diagnosis present

## 2023-11-20 DIAGNOSIS — K760 Fatty (change of) liver, not elsewhere classified: Secondary | ICD-10-CM | POA: Diagnosis present

## 2023-11-20 DIAGNOSIS — F1020 Alcohol dependence, uncomplicated: Secondary | ICD-10-CM | POA: Diagnosis present

## 2023-11-20 DIAGNOSIS — Z5902 Unsheltered homelessness: Secondary | ICD-10-CM | POA: Diagnosis not present

## 2023-11-20 DIAGNOSIS — E876 Hypokalemia: Secondary | ICD-10-CM | POA: Diagnosis present

## 2023-11-20 DIAGNOSIS — I959 Hypotension, unspecified: Secondary | ICD-10-CM | POA: Diagnosis present

## 2023-11-20 DIAGNOSIS — Z79899 Other long term (current) drug therapy: Secondary | ICD-10-CM | POA: Diagnosis not present

## 2023-11-20 DIAGNOSIS — F10239 Alcohol dependence with withdrawal, unspecified: Secondary | ICD-10-CM | POA: Diagnosis not present

## 2023-11-20 DIAGNOSIS — Z91128 Patient's intentional underdosing of medication regimen for other reason: Secondary | ICD-10-CM | POA: Diagnosis not present

## 2023-11-20 DIAGNOSIS — Y92009 Unspecified place in unspecified non-institutional (private) residence as the place of occurrence of the external cause: Secondary | ICD-10-CM | POA: Diagnosis not present

## 2023-11-20 DIAGNOSIS — K2990 Gastroduodenitis, unspecified, without bleeding: Secondary | ICD-10-CM | POA: Diagnosis not present

## 2023-11-20 DIAGNOSIS — K2980 Duodenitis without bleeding: Secondary | ICD-10-CM | POA: Diagnosis present

## 2023-11-20 DIAGNOSIS — Y907 Blood alcohol level of 200-239 mg/100 ml: Secondary | ICD-10-CM | POA: Diagnosis present

## 2023-11-20 DIAGNOSIS — G40909 Epilepsy, unspecified, not intractable, without status epilepticus: Secondary | ICD-10-CM | POA: Diagnosis present

## 2023-11-20 DIAGNOSIS — Z21 Asymptomatic human immunodeficiency virus [HIV] infection status: Secondary | ICD-10-CM | POA: Diagnosis present

## 2023-11-20 DIAGNOSIS — D72819 Decreased white blood cell count, unspecified: Secondary | ICD-10-CM | POA: Diagnosis present

## 2023-11-20 DIAGNOSIS — R569 Unspecified convulsions: Secondary | ICD-10-CM | POA: Diagnosis present

## 2023-11-20 LAB — COMPREHENSIVE METABOLIC PANEL
ALT: 13 U/L (ref 0–44)
AST: 15 U/L (ref 15–41)
Albumin: 2.9 g/dL — ABNORMAL LOW (ref 3.5–5.0)
Alkaline Phosphatase: 50 U/L (ref 38–126)
Anion gap: 8 (ref 5–15)
BUN: <5 mg/dL — ABNORMAL LOW (ref 6–20)
CO2: 26 mmol/L (ref 22–32)
Calcium: 9 mg/dL (ref 8.9–10.3)
Chloride: 103 mmol/L (ref 98–111)
Creatinine, Ser: 0.64 mg/dL (ref 0.61–1.24)
GFR, Estimated: >60 mL/min (ref >60)
Glucose, Bld: 98 mg/dL (ref 70–99)
Potassium: 4.1 mmol/L (ref 3.5–5.1)
Sodium: 137 mmol/L (ref 135–145)
Total Bilirubin: 0.5 mg/dL (ref 0.0–1.2)
Total Protein: 5.8 g/dL — ABNORMAL LOW (ref 6.5–8.1)

## 2023-11-20 LAB — CBC
HCT: 30.7 % — ABNORMAL LOW (ref 39.0–52.0)
Hemoglobin: 10.4 g/dL — ABNORMAL LOW (ref 13.0–17.0)
MCH: 31 pg (ref 26.0–34.0)
MCHC: 33.9 g/dL (ref 30.0–36.0)
MCV: 91.4 fL (ref 80.0–100.0)
Platelets: 221 10*3/uL (ref 150–400)
RBC: 3.36 MIL/uL — ABNORMAL LOW (ref 4.22–5.81)
RDW: 15.2 % (ref 11.5–15.5)
WBC: 3.1 10*3/uL — ABNORMAL LOW (ref 4.0–10.5)
nRBC: 0 % (ref 0.0–0.2)

## 2023-11-20 LAB — LIPASE, BLOOD: Lipase: 344 U/L — ABNORMAL HIGH (ref 11–51)

## 2023-11-20 MED ORDER — OXYCODONE HCL 5 MG PO TABS
7.5000 mg | ORAL_TABLET | ORAL | Status: DC | PRN
  Administered 2023-11-20 – 2023-11-21 (×5): 7.5 mg via ORAL
  Filled 2023-11-20 (×6): qty 2

## 2023-11-20 NOTE — Plan of Care (Signed)

## 2023-11-20 NOTE — Progress Notes (Signed)
  Progress Note   Patient: Gary [name] FMW:[id number] DOB: [birth year] DOA: 11/18/2023     0 DOS: the patient was seen and examined on 11/20/2023 at 10:43 AM      Brief hospital course: 38 y.o. male with medical history significant of alcoholic pancreatitis, fatty liver, alcohol  abuse 2+ bottles wine daily, seizure disorder, hx portal vein thrombosis, HIV, polysubstance abuse, homelessness who presented with ongoing abdominal pain, possible seizure.  In the ER, CT abdomen confirmed pancreatitis, lipase was up to 509.       Assessment and Plan: * Alcohol  induced acute pancreatitis Still having severe pain Lipase trending down - Continue clear liquid diet - Trend Lipase     Seizure (HCC) Noncompliant with Keppra .  Also in withdrawal. - Continue Keppra     Alcohol  use disorder, severe, dependence (HCC) Drinks several bottles of wine. EtOH 207 at admission.  Had seizure prior to admission.  High risk for complicated withdrawal. Feels like he is in withdrawals, CIWA score 6-10 over the last 24 hours - Continue CIWA protocol with Ativan  - Folate, MVI and thiamine  supplement  Gastritis and duodenitis Due to alcohol   Hypokalemia Supplemented and resolved  HIV (human immunodeficiency virus infection) (HCC) Outpatient follow-up, see note yesterday          Subjective: Patient still having severe abdominal pain, feels like he is in withdrawal.  No vomiting, no confusion, no disorientation.     Physical Exam: BP 95/64 (BP Location: Left Arm)   Pulse (!) 51   Temp 98.3 F (36.8 C) (Oral)   Resp 18   Ht 6' (1.829 m)   Wt 68 kg   SpO2 97%   BMI 20.34 kg/m   Send adult male, sitting up in bed, interactive and appropriate RRR, no murmurs, no peripheral edema Respiratory rate normal, lungs clear without rales or wheezes Abdomen with voluntary guarding throughout Attention normal, affect guarded, judgment and insight appear at baseline, face symmetric, gait  shuffling, no tremor    Data Reviewed: Comprehensive metabolic panel shows improving lipase, stable renal function and electrolytes CBC shows mild stable anemia  Family Communication: None available    Disposition: The patient has ongoing alcohol  withdrawal and pancreatitis, not able to eat a normal diet, will need ongoing analgesics and slowly advancing diet         Author: Lonni SHAUNNA Dalton, MD 11/20/2023 12:26 PM  For on call review [email or web address].

## 2023-11-21 DIAGNOSIS — K852 Alcohol induced acute pancreatitis without necrosis or infection: Secondary | ICD-10-CM | POA: Diagnosis not present

## 2023-11-21 DIAGNOSIS — R569 Unspecified convulsions: Secondary | ICD-10-CM | POA: Diagnosis not present

## 2023-11-21 DIAGNOSIS — K2990 Gastroduodenitis, unspecified, without bleeding: Secondary | ICD-10-CM | POA: Diagnosis not present

## 2023-11-21 DIAGNOSIS — F1020 Alcohol dependence, uncomplicated: Secondary | ICD-10-CM | POA: Diagnosis not present

## 2023-11-21 LAB — COMPREHENSIVE METABOLIC PANEL
ALT: 13 U/L (ref 0–44)
AST: 17 U/L (ref 15–41)
Albumin: 3 g/dL — ABNORMAL LOW (ref 3.5–5.0)
Alkaline Phosphatase: 46 U/L (ref 38–126)
Anion gap: 8 (ref 5–15)
BUN: <5 mg/dL — ABNORMAL LOW (ref 6–20)
CO2: 26 mmol/L (ref 22–32)
Calcium: 8.9 mg/dL (ref 8.9–10.3)
Chloride: 102 mmol/L (ref 98–111)
Creatinine, Ser: 0.57 mg/dL — ABNORMAL LOW (ref 0.61–1.24)
GFR, Estimated: >60 mL/min (ref >60)
Glucose, Bld: 91 mg/dL (ref 70–99)
Potassium: 3.5 mmol/L (ref 3.5–5.1)
Sodium: 136 mmol/L (ref 135–145)
Total Bilirubin: 0.5 mg/dL (ref 0.0–1.2)
Total Protein: 6 g/dL — ABNORMAL LOW (ref 6.5–8.1)

## 2023-11-21 LAB — LIPASE, BLOOD: Lipase: 302 U/L — ABNORMAL HIGH (ref 11–51)

## 2023-11-21 LAB — CBC
HCT: 30.4 % — ABNORMAL LOW (ref 39.0–52.0)
Hemoglobin: 10.2 g/dL — ABNORMAL LOW (ref 13.0–17.0)
MCH: 30.4 pg (ref 26.0–34.0)
MCHC: 33.6 g/dL (ref 30.0–36.0)
MCV: 90.5 fL (ref 80.0–100.0)
Platelets: 225 10*3/uL (ref 150–400)
RBC: 3.36 MIL/uL — ABNORMAL LOW (ref 4.22–5.81)
RDW: 15 % (ref 11.5–15.5)
WBC: 2.8 10*3/uL — ABNORMAL LOW (ref 4.0–10.5)
nRBC: 0 % (ref 0.0–0.2)

## 2023-11-21 MED ORDER — LORAZEPAM 2 MG/ML IJ SOLN
1.0000 mg | INTRAMUSCULAR | Status: DC | PRN
  Administered 2023-11-21: 2 mg via INTRAVENOUS
  Administered 2023-11-22: 1 mg via INTRAVENOUS
  Filled 2023-11-21 (×2): qty 1

## 2023-11-21 MED ORDER — LORAZEPAM 1 MG PO TABS
1.0000 mg | ORAL_TABLET | ORAL | Status: DC | PRN
  Administered 2023-11-22: 2 mg via ORAL
  Filled 2023-11-21: qty 1

## 2023-11-21 MED ORDER — OXYCODONE HCL 5 MG PO TABS
5.0000 mg | ORAL_TABLET | Freq: Once | ORAL | Status: AC
  Administered 2023-11-21: 5 mg via ORAL
  Filled 2023-11-21: qty 1

## 2023-11-21 MED ORDER — HYDROMORPHONE HCL 2 MG PO TABS
2.0000 mg | ORAL_TABLET | ORAL | Status: DC | PRN
  Administered 2023-11-21 – 2023-11-23 (×9): 2 mg via ORAL
  Filled 2023-11-21 (×9): qty 1

## 2023-11-21 NOTE — Progress Notes (Signed)
 Pt c/o 10/10 abd pain.  PRNs exhausted, but pt doesn't endorse any relief.  States IV pain medicine worked better.  Dr. Shona, on call hospitalist, notified of pt complaints throughout the night.  2 separate One time orders of 5mg  OxyIR ordered at 0100 and 0430, see MAR.

## 2023-11-21 NOTE — Plan of Care (Signed)

## 2023-11-21 NOTE — Progress Notes (Signed)
  Progress Note   Patient: Gary [name] FMW:[id number] DOB: Dec 11, 1985 DOA: 11/18/2023     1 DOS: the patient was seen and examined on 11/21/2023 at 11:40AM      Brief hospital course: 38 y.o. male with medical history significant of alcoholic pancreatitis, fatty liver, alcohol  abuse 2+ bottles wine daily, seizure disorder, hx portal vein thrombosis, HIV, polysubstance abuse, homelessness who presented with ongoing abdominal pain, possible seizure.  In the ER, CT abdomen confirmed pancreatitis, lipase was up to 509.       Assessment and Plan: * Alcohol  induced acute pancreatitis Pain slightly better. Lipase flat 300s. - Advance to FLD - Change oxycodone  to PO hydromorphone      Seizure (HCC) Noncompliant with Keppra .  Also in withdrawal. - Continue Keppra     Alcohol  use disorder, severe, dependence (HCC) CIWA still 4-7, no delirium or seizures - Continue CIWA protocol with Ativan  - Folate, MVI and thiamine  supplement  Gastritis and duodenitis Due to alcohol   Hypokalemia Resolved  HIV (human immunodeficiency virus infection) (HCC) Noncompliant with antiviral.  HIV RNA 4520 last Dec, CD4 ct 500s.   Discussed with ID.  Given he is truly unreliable in taking Biktarvy , it should be held. - Outpatient follow up          Subjective: Still with pain, asking for IV Dilaudid , asking to advance diet.        Physical Exam: BP (!) 91/56 (BP Location: Left Arm)   Pulse 82   Temp 98.2 F (36.8 C) (Oral)   Resp 18   Ht 6' (1.829 m)   Wt 68 kg   SpO2 97%   BMI 20.34 kg/m   Thin adult male, sitting up in bed, interactive, irritable RRR, no murmurs, no peripheral edema Respiratory rate normal, lungs clear without rales or wheezes Abdomen with voluntary guarding throughout Attention normal, affect guarded, judgment and insight appear baseline  Data Reviewed: Lipase trending down, metabolic panel and LFTs normal CBC unchanged  Family Communication: None  available    Disposition: Status is: Inpatient         Author: Lonni SHAUNNA Dalton, MD 11/21/2023 3:51 PM  For on call review [email or web address].

## 2023-11-22 DIAGNOSIS — F1994 Other psychoactive substance use, unspecified with psychoactive substance-induced mood disorder: Secondary | ICD-10-CM

## 2023-11-22 DIAGNOSIS — K852 Alcohol induced acute pancreatitis without necrosis or infection: Secondary | ICD-10-CM | POA: Diagnosis not present

## 2023-11-22 DIAGNOSIS — R569 Unspecified convulsions: Secondary | ICD-10-CM | POA: Diagnosis not present

## 2023-11-22 DIAGNOSIS — F1020 Alcohol dependence, uncomplicated: Secondary | ICD-10-CM | POA: Diagnosis not present

## 2023-11-22 LAB — BASIC METABOLIC PANEL
Anion gap: 7 (ref 5–15)
BUN: <5 mg/dL — ABNORMAL LOW (ref 6–20)
CO2: 28 mmol/L (ref 22–32)
Calcium: 9.9 mg/dL (ref 8.9–10.3)
Chloride: 101 mmol/L (ref 98–111)
Creatinine, Ser: 0.62 mg/dL (ref 0.61–1.24)
GFR, Estimated: >60 mL/min (ref >60)
Glucose, Bld: 87 mg/dL (ref 70–99)
Potassium: 4.3 mmol/L (ref 3.5–5.1)
Sodium: 136 mmol/L (ref 135–145)

## 2023-11-22 LAB — CBC
HCT: 33.2 % — ABNORMAL LOW (ref 39.0–52.0)
Hemoglobin: 11 g/dL — ABNORMAL LOW (ref 13.0–17.0)
MCH: 30.1 pg (ref 26.0–34.0)
MCHC: 33.1 g/dL (ref 30.0–36.0)
MCV: 90.7 fL (ref 80.0–100.0)
Platelets: 269 10*3/uL (ref 150–400)
RBC: 3.66 MIL/uL — ABNORMAL LOW (ref 4.22–5.81)
RDW: 15.1 % (ref 11.5–15.5)
WBC: 3.6 10*3/uL — ABNORMAL LOW (ref 4.0–10.5)
nRBC: 0 % (ref 0.0–0.2)

## 2023-11-22 LAB — LIPASE, BLOOD: Lipase: 328 U/L — ABNORMAL HIGH (ref 11–51)

## 2023-11-22 MED ORDER — ESCITALOPRAM OXALATE 10 MG PO TABS: 5.0000 mg | ORAL_TABLET | Freq: Every day | ORAL | Status: DC

## 2023-11-22 MED ORDER — ESCITALOPRAM OXALATE 10 MG PO TABS
5.0000 mg | ORAL_TABLET | Freq: Every day | ORAL | Status: DC
  Administered 2023-11-22: 5 mg via ORAL
  Filled 2023-11-22: qty 1

## 2023-11-22 NOTE — Plan of Care (Signed)

## 2023-11-22 NOTE — Progress Notes (Signed)
 Transition of Care [facility]) - Inpatient Brief Assessment   Patient Details  Name: Gary [name] MRN: [id number] Date of Birth: 1986-05-22  Transition of Care [facility] CM/SW Contact:    Rosaline JONELLE Joe, RN Phone Number: 11/22/2023, 10:37 AM   Clinical Narrative: CM met with the patient at the bedside to discuss TOC needs.  The patient is homeless and has been sleeping outside in the community and states that the shelters are full.  Patient has history of drinking 2 bottles of Vodka that he receives from friends.   I spoke with the patient regarding ETOH and provided OP counseling resources and the patient states that he does not plan to follow up since he said only medication helps... otherwise he just goes back to drinking.  Patient will be provided with new pants from the [facility] closet for homeless.  I encouraged patient to seek shelter tomorrow when discharged - patient was hesitant and likely plans to discharge to the community.  PCP follow up placed in the discharge summary.  Patient will be provided with bus ticket back to [facility] tomorrow and follow up with PCP.    Transition of Care Asessment: Insurance and Status: (P) Insurance coverage has been reviewed Patient has primary care physician: (P) No (PCP follow up provided in the AVS.) Home environment has been reviewed: (P) Homeless Prior level of function:: (P) Independent Prior/Current Home Services: (P) No current home services Social Drivers of Health Review: (P) SDOH reviewed interventions complete Readmission risk has been reviewed: (P) Yes Transition of care needs: (P) transition of care needs identified, TOC will continue to follow

## 2023-11-22 NOTE — Progress Notes (Signed)
  Progress Note   Patient: Gary [name] FMW:[id number] DOB: Oct 20, 1986 DOA: 11/18/2023     2 DOS: the patient was seen and examined on 11/22/2023 at 9:29AM      Brief hospital course: 38 y.o. male with medical history significant of alcoholic pancreatitis, fatty liver, alcohol  abuse 2+ bottles wine daily, seizure disorder, hx portal vein thrombosis, HIV, polysubstance abuse, homelessness who presented with ongoing abdominal pain, possible seizure.  In the ER, CT abdomen confirmed pancreatitis, lipase was up to 509.       Assessment and Plan: * Alcohol  induced acute pancreatitis - Advance to full liquid diet - Continue analgesics and antiemetics - ADAT      Seizure (HCC) Noncompliant with Keppra .   - Continue Keppra    Alcohol  use disorder, severe, dependence (HCC) Improving - Continue vitamins - Continue CIWA scoring  Substance induced mood disorder (HCC) - Start Lexapro   Gastritis and duodenitis Due to alcohol   Hypokalemia - Supp K  HIV (human immunodeficiency virus infection) (HCC) Noncompliant with antiviral.  HIV RNA 4520 last Dec, CD4 ct 500s.   Discussed with ID.  Given he is truly unreliable in taking Biktarvy , it should be held. - Outpatient follow up          Subjective: Pain resolving.  Some discomfort after oral intake yesterday, no vomiting.  Withdrawals improving.     Physical Exam: BP 99/68 (BP Location: Left Arm)   Pulse 68   Temp 98.2 F (36.8 C) (Oral)   Resp 17   Ht 6' (1.829 m)   Wt 68 kg   SpO2 99%   BMI 20.34 kg/m   Thin adult, lyin gin bed, no acute distress, disheveled and malnourished RRR no murmurs, no LE edema RR noraml, lungs clear without rales or wheezes Abdomen diffusely tender, voluntary guarding throughout Neuro intact   Data Reviewed: Lipase up to mid 300s LFTs normal Cr and K normal Hgb stable at 11  Family Communication:     Disposition: Status is: Inpatient Needs ongoing management of  pancreatitis, still not taking solid diet        Author: Lonni SHAUNNA Dalton, MD 11/22/2023 10:44 AM  For on call review [email or web address].

## 2023-11-22 NOTE — Assessment & Plan Note (Signed)
Start Lexapro

## 2023-11-23 ENCOUNTER — Other Ambulatory Visit (HOSPITAL_COMMUNITY): Payer: Self-pay

## 2023-11-23 DIAGNOSIS — K852 Alcohol induced acute pancreatitis without necrosis or infection: Secondary | ICD-10-CM | POA: Diagnosis not present

## 2023-11-23 LAB — COMPREHENSIVE METABOLIC PANEL
ALT: 12 U/L (ref 0–44)
AST: 17 U/L (ref 15–41)
Albumin: 3.4 g/dL — ABNORMAL LOW (ref 3.5–5.0)
Alkaline Phosphatase: 47 U/L (ref 38–126)
Anion gap: 8 (ref 5–15)
BUN: 9 mg/dL (ref 6–20)
CO2: 26 mmol/L (ref 22–32)
Calcium: 9.5 mg/dL (ref 8.9–10.3)
Chloride: 100 mmol/L (ref 98–111)
Creatinine, Ser: 0.8 mg/dL (ref 0.61–1.24)
GFR, Estimated: >60 mL/min (ref >60)
Glucose, Bld: 93 mg/dL (ref 70–99)
Potassium: 4.5 mmol/L (ref 3.5–5.1)
Sodium: 134 mmol/L — ABNORMAL LOW (ref 135–145)
Total Bilirubin: 0.6 mg/dL (ref 0.0–1.2)
Total Protein: 6.8 g/dL (ref 6.5–8.1)

## 2023-11-23 LAB — CBC
HCT: 37.7 % — ABNORMAL LOW (ref 39.0–52.0)
Hemoglobin: 12.8 g/dL — ABNORMAL LOW (ref 13.0–17.0)
MCH: 30.4 pg (ref 26.0–34.0)
MCHC: 34 g/dL (ref 30.0–36.0)
MCV: 89.5 fL (ref 80.0–100.0)
Platelets: 254 10*3/uL (ref 150–400)
RBC: 4.21 MIL/uL — ABNORMAL LOW (ref 4.22–5.81)
RDW: 15 % (ref 11.5–15.5)
WBC: 4.1 10*3/uL (ref 4.0–10.5)
nRBC: 0 % (ref 0.0–0.2)

## 2023-11-23 LAB — LIPASE, BLOOD: Lipase: 304 U/L — ABNORMAL HIGH (ref 11–51)

## 2023-11-23 MED ORDER — HYDROXYZINE HCL 10 MG PO TABS
10.0000 mg | ORAL_TABLET | Freq: Three times a day (TID) | ORAL | 0 refills | Status: DC | PRN
  Filled 2023-11-23: qty 12, 4d supply, fill #0

## 2023-11-23 MED ORDER — HYDROMORPHONE HCL 2 MG PO TABS
2.0000 mg | ORAL_TABLET | Freq: Once | ORAL | Status: AC
  Administered 2023-11-23: 2 mg via ORAL
  Filled 2023-11-23: qty 1

## 2023-11-23 MED ORDER — ESCITALOPRAM OXALATE 5 MG PO TABS
5.0000 mg | ORAL_TABLET | Freq: Every day | ORAL | 5 refills | Status: DC
  Filled 2023-11-23: qty 30, 30d supply, fill #0

## 2023-11-23 MED ORDER — GABAPENTIN 300 MG PO CAPS
300.0000 mg | ORAL_CAPSULE | Freq: Every day | ORAL | 5 refills | Status: DC
  Filled 2023-11-23: qty 30, 30d supply, fill #0

## 2023-11-23 MED ORDER — ONDANSETRON HCL 4 MG PO TABS
4.0000 mg | ORAL_TABLET | Freq: Four times a day (QID) | ORAL | Status: DC | PRN
  Administered 2023-11-23: 4 mg via ORAL
  Filled 2023-11-23: qty 1

## 2023-11-23 MED ORDER — PANCRELIPASE (LIP-PROT-AMYL) 36000-114000 UNITS PO CPEP
36000.0000 [IU] | ORAL_CAPSULE | Freq: Three times a day (TID) | ORAL | 5 refills | Status: DC
  Filled 2023-11-23: qty 100, 34d supply, fill #0

## 2023-11-23 NOTE — Discharge Summary (Signed)
 Physician Discharge Summary   Patient: Gary [name] MRN: [id number] DOB: Jun 17, 1986  Admit date:     11/18/2023  Discharge date: 11/23/23  Discharge Physician: Lonni SHAUNNA Dalton   PCP: Default, Provider, MD     Recommendations at discharge:  Follow up with new PCP at [facility] as soon as able Follow up with substance use treatment center as soon as able Follow up with [facility] as soon as able to resume ART     Discharge Diagnoses: Principal Problem:   Alcohol  induced acute pancreatitis Active Problems:   Alcohol  use disorder, severe, dependence (HCC)   Seizure (HCC)   Substance induced mood disorder (HCC)   HIV (human immunodeficiency virus infection) (HCC)   Hypokalemia   Gastritis and duodenitis       Hospital Course: 38 y.o. male with medical history significant of alcoholic pancreatitis, fatty liver, alcohol  abuse 2+ bottles wine daily, seizure disorder, hx portal vein thrombosis, HIV, polysubstance abuse, homelessness who presented with ongoing abdominal pain, possible seizure.  In the ER, CT abdomen confirmed pancreatitis, lipase was up to 509.        * Alcohol  induced acute pancreatitis Admitted on fluids, CLD.  CT showed acute on chronic pancreatitis, small areas of pseudocyst and necrosis unchanged from 1 month prior.  After bowel rest and fluids, lipase improved to 300 and he was able to advance diet to low fat diet over 72 hours and did well.  Discharged with Creon .  Recommend alcohol  cessation.    Alcohol  use disorder, severe, dependence (HCC) Alcohol  withdrawal, uncomplicated Treated with on demand lorazepam  for mild withdrawal symptoms, this resolved, no seizures or delirium.  Discharged on gabapentin  at night, and with instructions for where to find SUT. Cessation strongly recommended.  Substance induced mood disorder (anxiety) Patient with anxiety related to alcohol .  Denies depressed mood.  Started Lexapro  here.  HIV  (human immunodeficiency virus infection) (HCC) Noncompliant with antivirals.    HIV RNA 4520 last Dec, CD4 ct 500s.     I discussed with ID, given his unreliable outpatient use of Biktarvy , they recommend not resuming this without follow up in the office with ID.  Patient informed. - Outpatient follow up  History of seizures Noncompliant with Keppra .   Refills given at discharge           [place]  Controlled Substances Registry was reviewed for this patient prior to discharge.  Consultants: None Procedures performed: CT abdomen  Disposition: Home Diet recommendation:  Discharge Diet Orders (From admission, onward)     Start     Ordered   11/23/23 0000  Diet - low sodium heart healthy        11/23/23 1406             DISCHARGE MEDICATION: Allergies as of 11/23/2023       Reactions   Tegretol  [carbamazepine ] Other (See Comments)   Vertigo and paralysis   Caffeine Palpitations        Medication List     PAUSE taking these medications    Biktarvy  50-200-25 MG Tabs tablet Wait to take this until your doctor or other care provider tells you to start again. Generic drug: bictegravir-emtricitabine -tenofovir  AF Take 1 tablet by mouth daily.       TAKE these medications    escitalopram  5 MG tablet Commonly known as: LEXAPRO  Take 1 tablet (5 mg total) by mouth at bedtime.   gabapentin  300 MG capsule Commonly known as: Neurontin  Take 1 capsule (300  mg total) by mouth at bedtime.   hydrOXYzine  10 MG tablet Commonly known as: ATARAX  Take 1 tablet (10 mg total) by mouth 3 (three) times daily as needed.   levETIRAcetam  500 MG tablet Commonly known as: Keppra  Take 1 tablet (500 mg total) by mouth 2 (two) times daily.   lipase/protease/amylase 63999 UNITS Cpep capsule Commonly known as: CREON  Take 1 capsule (36,000 Units total) by mouth 3 (three) times daily before meals.   ondansetron  4 MG disintegrating tablet Commonly known as:  ZOFRAN -ODT Dissolve 1 tablet (4 mg total) by mouth every 8 (eight) hours as needed for nausea or vomiting.        Follow-up Information     [facility]. Schedule an appointment as soon as possible for a visit.   Why: Call the clinic and schedule a hospifal follow up in the next 7-10 days. Contact information: [address] [place]  [address] [phone]        [facility]              Follow up.   Contact information: [address] [place]  [phone]                Discharge Instructions     Diet - low sodium heart healthy   Complete by: As directed    Discharge instructions   Complete by: As directed    **IMPORTANT DISCHARGE INSTRUCTIONS**   From Dr. Jonel: You were admitted for acute on chronic pancreatitis  The acute pancreatitis appears to have resolved.  The most important thing is to stop alcohol  completely Any alcohol  that you resume will make the pancreas flare up again  Call one of the substance use treatment centers in the packet we provided and connect with treatment as soon as possible  In addition, eat a bland diet (bananas, rice, toast, cereal, crackers, mashed potatoes).  AVOID fatty meaty foods.  When you eat, take Creon  with each meal Creon  is pancreas enzymes, and will reduce pain with eating for someone with chronic pancreatitis  Also, if you need it, take ondansetron /Zofran  for nausea  For your HIV meds: do not take them for now.  Call the Infectious Disease clinic (see below in the To Do section) and ask them for an appointment and to restart meds  For your seizure medicine: Restart Keppra  Go see a primary care doctor (see below in the To Do section, and call the [facility] for an appointment) Ask the new primary care doctor for instructions  For the anxiety: Take Lexapro  5 mg  nightly If you have bad anxiety, you can take hydroxyzine  to calm down for a few hours  Take gabapentin  at night, to help with insomnia from being off alcohol  for the next 2 months   Increase activity slowly   Complete by: As directed        Discharge Exam: Filed Weights   11/19/23 0507  Weight: 68 kg    General: Pt is alert, awake, not in acute distress, appears chronically ill and malnourished Cardiovascular: RRR, nl S1-S2, no murmurs appreciated.   No LE edema.   Respiratory: Normal respiratory rate and rhythm.  CTAB without rales or wheezes. Abdominal: Abdomen soft and with diffuse guarding and tenderness.  No distension or HSM.   Neuro/Psych: Strength symmetric in upper and lower extremities.  Judgment and insight appear normal.   Condition at discharge:  fair  The results of significant diagnostics from this hospitalization (including imaging, microbiology, ancillary and laboratory) are listed below for reference.   Imaging Studies: CT ABDOMEN PELVIS W CONTRAST Result Date: 11/18/2023 CLINICAL DATA:  Severe acute pancreatitis. Abdominal pain. History of alcoholic pancreatitis. EXAM: CT ABDOMEN AND PELVIS WITH CONTRAST TECHNIQUE: Multidetector CT imaging of the abdomen and pelvis was performed using the standard protocol following bolus administration of intravenous contrast. RADIATION DOSE REDUCTION: This exam was performed according to the departmental dose-optimization program which includes automated exposure control, adjustment of the mA and/or kV according to patient size and/or use of iterative reconstruction technique. CONTRAST:  OMNIPAQUE  IOHEXOL  300 MG/ML  SOLN COMPARISON:  10/28/2023 FINDINGS: Lower chest: Left lower lobe atelectasis or pneumonia. Hepatobiliary: Hepatic steatosis. Unremarkable gallbladder and biliary tree. Pancreas: Pancreatic atrophy. Redemonstrated inflammatory stranding about the pancreatic head. The previous 2.1 cm pseudocyst in the uncinate  process is not well visualized today. Unchanged 1.4 cm pseudocyst in the pancreatic head. Similar areas of poor enhancement in the pancreatic head and uncinate process suspicious for necrosis. No pancreatic ductal dilation. The overall appearance is similar to 10/28/2023. Spleen: Unremarkable. Adrenals/Urinary Tract: Normal adrenal glands. No urinary calculi or hydronephrosis. Unremarkable bladder. Stomach/Bowel: Normal caliber large and small bowel. Colonic wall thickening and mucosal hyperenhancement near the hepatic flexure likely reactive secondary to pancreatitis. Wall thickening of the duodenum and stomach with mucosal hyperenhancement is also favored reactive. Normal appendix. 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 Ill-defined filling defects in the superior mesenteric vein are favored due to mixing artifact. Thrombus is considered less likely though not excluded without delayed phase. No lymphadenopathy. Normal caliber aorta. Reproductive: Unremarkable. Other: Peripancreatic free fluid and free fluid in the pelvis. No organized fluid collection or abscess. No free intraperitoneal air. Musculoskeletal: No acute fracture. IMPRESSION: 1. Similar findings of acute pancreatitis with areas of poor enhancement in the pancreatic head and uncinate process suspicious for necrosis. 2. Reactive duodenitis, gastritis, and colitis near the hepatic flexure. 3. 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 4. Left lower lobe atelectasis or pneumonia. 5. Hepatic steatosis. Electronically Signed   By: Norman Gatlin M.D.   On: 11/18/2023 01:25   DG Chest 2 View Result Date: 11/06/2023 CLINICAL DATA:  Hypotension. EXAM: CHEST - 2 VIEW COMPARISON:  Radiograph 8 days ago 11/01/2023 FINDINGS: Significant patient rotation. Chronic elevation of left hemidiaphragm. There is increasing patchy opacity at the left lung base. The right lung is clear. Grossly stable  heart size and mediastinal contours allowing for rotation on the current exam. No pneumothorax or large pleural effusion. Normal pulmonary vasculature. IMPRESSION: 1. Increasing patchy opacity at the left lung base, suspicious for pneumonia. 2. Chronic elevation of left hemidiaphragm. Electronically Signed   By: Andrea Gasman M.D.   On: 11/06/2023 20:57   Acute Abdominal Series Result Date: 11/01/2023 CLINICAL DATA:  Nausea EXAM: DG ABDOMEN ACUTE WITH 1 VIEW CHEST COMPARISON:  09/30/2023 FINDINGS: Elevated left hemidiaphragm. Significant gaseous distention of the stomach. No confluent airspace opacities or effusions. Heart mediastinal contours within normal limits. No acute bony abnormality. IMPRESSION: No acute cardiopulmonary disease. Elevation of the left hemidiaphragm with significant gaseous distention of the stomach. Electronically Signed   By: Franky Crease M.D.   On: 11/01/2023 01:12   CT ABDOMEN PELVIS W CONTRAST Result Date: 10/29/2023 CLINICAL DATA:  Abdominal pain, emesis recent admission for alcohol  withdrawal and alcohol  pancreatitis. Drank alcohol  after leaving hospital. Pain is  consistent with prior pancreatitis EXAM: CT ABDOMEN AND PELVIS WITH CONTRAST TECHNIQUE: Multidetector CT imaging of the abdomen and pelvis was performed using the standard protocol following bolus administration of intravenous contrast. RADIATION DOSE REDUCTION: This exam was performed according to the departmental dose-optimization program which includes automated exposure control, adjustment of the mA and/or kV according to patient size and/or use of iterative reconstruction technique. CONTRAST:  OMNIPAQUE  IOHEXOL  300 MG/ML  SOLN COMPARISON:  10/16/2023 FINDINGS: Lower chest: No acute abnormality. Hepatobiliary: Hepatic steatosis. Gallbladder and biliary tree are unremarkable. Pancreas: Pancreatic atrophy. Hazy fat stranding and fluid about the pancreatic head and uncinate process. 2.1 cm pseudocyst in the  uncinate process is similar to prior and 1.4 cm pseudocyst in the pancreatic head has slightly decreased in size. Areas of poor enhancement in the pancreatic head compatible with necrosis. No ductal dilation. The overall extent of pancreatitis has slightly improved compared to 10/16/2023. Spleen: Unremarkable. Adrenals/Urinary Tract: Normal adrenal glands. No urinary calculi or hydronephrosis. Diffuse bladder wall thickening. Stomach/Bowel: No bowel obstruction or bowel wall thickening. Distended stomach. Mild wall thickening about the duodenum likely reactive secondary to pancreatitis. Normal appendix. 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 No lymphadenopathy. Reproductive: Unremarkable. Other: No free intraperitoneal air. Musculoskeletal: No acute fracture. IMPRESSION: 1. Slightly improved severe pancreatitis compared to 10/16/2023. 2. 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 3. Hepatic steatosis. 4. Diffuse bladder wall thickening. Correlate with urinalysis to exclude cystitis. Electronically Signed   By: Norman Gatlin M.D.   On: 10/29/2023 00:03    Microbiology: Results for orders placed or performed during the hospital encounter of 10/31/23  Urine Culture     Status: Abnormal   Collection Time: 10/31/23  4:40 AM   Specimen: Urine, Random  Result Value Ref Range Status   Specimen Description URINE, RANDOM  Final   Special Requests   Final    NONE Reflexed from [id number] Performed at [facility], [address] [place], [place] [address]    Culture MULTIPLE SPECIES PRESENT, SUGGEST RECOLLECTION (A)  Final   Report Status 11/02/2023 FINAL  Final    Labs: CBC: Recent Labs  Lab 11/17/23 2052 11/18/23 2020 11/19/23 0455 11/20/23 0636 11/21/23 9347 11/22/23 0556 11/23/23 0839  WBC 5.0   < > 3.4* 3.1* 2.8* 3.6* 4.1  NEUTROABS 2.0  --   --   --   --   --   --   HGB 11.3*   < > 10.2* 10.4* 10.2* 11.0* 12.8*  HCT 35.4*   < > 30.6* 30.7* 30.4* 33.2* 37.7*  MCV  95.4   < > 92.2 91.4 90.5 90.7 89.5  PLT 317   < > 270 221 225 269 254   < > = values in this interval not displayed.   Basic Metabolic Panel: Recent Labs  Lab 11/19/23 0455 11/20/23 0636 11/21/23 0652 11/22/23 0556 11/23/23 0839  NA 141 137 136 136 134*  K 3.6 4.1 3.5 4.3 4.5  CL 107 103 102 101 100  CO2 24 26 26 28 26   GLUCOSE 86 98 91 87 93  BUN <5* <5* <5* <5* 9  CREATININE 0.57* 0.64 0.57* 0.62 0.80  CALCIUM  8.3* 9.0 8.9 9.9 9.5   Liver Function Tests: Recent Labs  Lab 11/17/23 2052 11/18/23 2020 11/20/23 0636 11/21/23 0652 11/23/23 0839  AST 16 22 15 17 17   ALT 13 16 13 13 12   ALKPHOS 42 48 50 46 47  BILITOT 0.4 <0.2 0.5 0.5 0.6  PROT  7.1 7.0 5.8* 6.0* 6.8  ALBUMIN 3.6 3.5 2.9* 3.0* 3.4*   CBG: No results for input(s): GLUCAP in the last 168 hours.  Discharge time spent: approximately 45 minutes spent on discharge counseling, evaluation of patient on day of discharge, and coordination of discharge planning with nursing, social work, pharmacy and case management  Signed: Lonni SHAUNNA Dalton, MD [facility] 11/23/2023

## 2023-11-23 NOTE — Progress Notes (Signed)
 Discharge instructions given; patient expressed understanding, all questions answered.

## 2023-11-23 NOTE — TOC Progression Note (Signed)
 Transition of Care [facility]) - Progression Note    Patient Details  Name: Gary [name] MRN: [id number] Date of Birth: [birth year]  [facility] CM/SW Contact  Yan Pankratz A Dinesh Ulysse, LCSWA Phone Number: 11/23/2023, 12:25 PM  Clinical Narrative:     CSW provided pt with clothing to assist with need. Charitable foundation documentation completed and form signed by pt.    No other needs identified at this time. TOC will sign off, please consult again if TOC needs arise.         Expected Discharge Plan and Services                                               Social Determinants of Health (SDOH) Interventions SDOH Screenings   Food Insecurity: Food Insecurity Present (11/19/2023)  Housing: High Risk (11/19/2023)  Transportation Needs: Unmet Transportation Needs (11/19/2023)  Utilities: Patient Unable To Answer (11/19/2023)  Recent Concern: Utilities - At Risk (11/11/2023)  Alcohol  Screen: High Risk (07/25/2021)  Depression (PHQ2-9): Medium Risk (02/12/2021)  Financial Resource Strain: Patient Declined (10/11/2023)  Social Connections: Unknown (06/24/2022)   Received from [facility], [facility]  Stress: No Stress Concern Present (06/30/2022)   Received from [facility], [facility]  Recent Concern: Stress - Stress Concern Present (06/25/2022)   Received from [facility]  Tobacco Use: High Risk (11/18/2023)    Readmission Risk Interventions    11/22/2023   10:36 AM 11/12/2023   12:41 PM 11/04/2023    2:28 PM  Readmission Risk Prevention Plan  Transportation Screening Complete Complete Complete  Medication Review Oceanographer) Complete Complete Complete  PCP or Specialist appointment within 3-5 days of discharge Complete  Patient refused  HRI or Home Care Consult Complete Complete Complete  SW Recovery Care/Counseling Consult Complete Complete Complete  Palliative Care Screening Not Applicable Not Applicable Not Applicable  Skilled Nursing Facility Not  Applicable Not Applicable Not Applicable

## 2023-11-24 ENCOUNTER — Encounter (HOSPITAL_COMMUNITY): Payer: Self-pay | Admitting: Emergency Medicine

## 2023-11-24 ENCOUNTER — Inpatient Hospital Stay (HOSPITAL_COMMUNITY)
Admission: EM | Admit: 2023-11-24 | Discharge: 2023-12-01 | DRG: 439 | Disposition: A | Discharge status: Home / Self Care | Payer: MEDICAID | Attending: Internal Medicine | Admitting: Internal Medicine

## 2023-11-24 ENCOUNTER — Other Ambulatory Visit: Payer: Self-pay

## 2023-11-24 DIAGNOSIS — G629 Polyneuropathy, unspecified: Secondary | ICD-10-CM | POA: Diagnosis present

## 2023-11-24 DIAGNOSIS — F1910 Other psychoactive substance abuse, uncomplicated: Secondary | ICD-10-CM | POA: Diagnosis present

## 2023-11-24 DIAGNOSIS — Z888 Allergy status to other drugs, medicaments and biological substances status: Secondary | ICD-10-CM

## 2023-11-24 DIAGNOSIS — K852 Alcohol induced acute pancreatitis without necrosis or infection: Secondary | ICD-10-CM | POA: Diagnosis present

## 2023-11-24 DIAGNOSIS — Z5900 Homelessness unspecified: Secondary | ICD-10-CM

## 2023-11-24 DIAGNOSIS — F3181 Bipolar II disorder: Secondary | ICD-10-CM | POA: Diagnosis present

## 2023-11-24 DIAGNOSIS — Z21 Asymptomatic human immunodeficiency virus [HIV] infection status: Secondary | ICD-10-CM | POA: Diagnosis present

## 2023-11-24 DIAGNOSIS — K859 Acute pancreatitis without necrosis or infection, unspecified: Secondary | ICD-10-CM | POA: Diagnosis present

## 2023-11-24 DIAGNOSIS — F10939 Alcohol use, unspecified with withdrawal, unspecified: Secondary | ICD-10-CM | POA: Diagnosis present

## 2023-11-24 DIAGNOSIS — R54 Age-related physical debility: Secondary | ICD-10-CM | POA: Diagnosis present

## 2023-11-24 DIAGNOSIS — F4310 Post-traumatic stress disorder, unspecified: Secondary | ICD-10-CM | POA: Diagnosis present

## 2023-11-24 DIAGNOSIS — Z800 Family history of malignant neoplasm of digestive organs: Secondary | ICD-10-CM

## 2023-11-24 DIAGNOSIS — F1010 Alcohol abuse, uncomplicated: Secondary | ICD-10-CM | POA: Diagnosis present

## 2023-11-24 DIAGNOSIS — Z91199 Patient's noncompliance with other medical treatment and regimen due to unspecified reason: Secondary | ICD-10-CM

## 2023-11-24 DIAGNOSIS — F10930 Alcohol use, unspecified with withdrawal, uncomplicated: Secondary | ICD-10-CM

## 2023-11-24 DIAGNOSIS — Z681 Body mass index (BMI) 19 or less, adult: Secondary | ICD-10-CM

## 2023-11-24 DIAGNOSIS — Z811 Family history of alcohol abuse and dependence: Secondary | ICD-10-CM

## 2023-11-24 DIAGNOSIS — F209 Schizophrenia, unspecified: Secondary | ICD-10-CM | POA: Diagnosis present

## 2023-11-24 DIAGNOSIS — K8521 Alcohol induced acute pancreatitis with uninfected necrosis: Principal | ICD-10-CM | POA: Diagnosis present

## 2023-11-24 DIAGNOSIS — Y907 Blood alcohol level of 200-239 mg/100 ml: Secondary | ICD-10-CM | POA: Diagnosis present

## 2023-11-24 DIAGNOSIS — R569 Unspecified convulsions: Secondary | ICD-10-CM | POA: Diagnosis present

## 2023-11-24 DIAGNOSIS — R64 Cachexia: Secondary | ICD-10-CM | POA: Diagnosis present

## 2023-11-24 DIAGNOSIS — K5900 Constipation, unspecified: Secondary | ICD-10-CM | POA: Diagnosis present

## 2023-11-24 DIAGNOSIS — F17210 Nicotine dependence, cigarettes, uncomplicated: Secondary | ICD-10-CM | POA: Diagnosis present

## 2023-11-24 DIAGNOSIS — Z79899 Other long term (current) drug therapy: Secondary | ICD-10-CM

## 2023-11-24 LAB — COMPREHENSIVE METABOLIC PANEL
ALT: 13 U/L (ref 0–44)
AST: 18 U/L (ref 15–41)
Alkaline Phosphatase: 61 U/L (ref 38–126)
Creatinine, Ser: 0.7 mg/dL (ref 0.61–1.24)
Glucose, Bld: 90 mg/dL (ref 70–99)
Sodium: 135 mmol/L (ref 135–145)
Total Protein: 8.5 g/dL — ABNORMAL HIGH (ref 6.5–8.1)

## 2023-11-24 LAB — CBC
HCT: 40.8 % (ref 39.0–52.0)
Hemoglobin: 13.6 g/dL (ref 13.0–17.0)
MCH: 30.4 pg (ref 26.0–34.0)
MCHC: 33.3 g/dL (ref 30.0–36.0)
MCV: 91.3 fL (ref 80.0–100.0)
Platelets: 358 10*3/uL (ref 150–400)
RBC: 4.47 MIL/uL (ref 4.22–5.81)
RDW: 15.4 % (ref 11.5–15.5)
WBC: 8.3 10*3/uL (ref 4.0–10.5)
nRBC: 0 % (ref 0.0–0.2)

## 2023-11-24 LAB — COMPREHENSIVE METABOLIC PANEL WITH GFR
Albumin: 4.2 g/dL (ref 3.5–5.0)
Anion gap: 13 (ref 5–15)
BUN: 18 mg/dL (ref 6–20)
CO2: 25 mmol/L (ref 22–32)
Calcium: 9 mg/dL (ref 8.9–10.3)
Chloride: 97 mmol/L — ABNORMAL LOW (ref 98–111)
GFR, Estimated: >60 mL/min (ref >60)
Potassium: 3.6 mmol/L (ref 3.5–5.1)
Total Bilirubin: 0.4 mg/dL (ref 0.0–1.2)

## 2023-11-24 LAB — LIPASE, BLOOD: Lipase: 399 U/L — ABNORMAL HIGH (ref 11–51)

## 2023-11-24 LAB — ETHANOL: Alcohol, Ethyl (B): 216 mg/dL — ABNORMAL HIGH (ref <10)

## 2023-11-24 NOTE — ED Triage Notes (Signed)
 BIB EMS from [facility] for upper abdominal pain.  Suspects recurrence of his pancreatitis.

## 2023-11-25 ENCOUNTER — Emergency Department (HOSPITAL_COMMUNITY): Payer: MEDICAID

## 2023-11-25 DIAGNOSIS — K8500 Idiopathic acute pancreatitis without necrosis or infection: Secondary | ICD-10-CM | POA: Diagnosis not present

## 2023-11-25 LAB — URINALYSIS, ROUTINE W REFLEX MICROSCOPIC
Bilirubin Urine: NEGATIVE
Glucose, UA: NEGATIVE mg/dL
Hgb urine dipstick: NEGATIVE
Ketones, ur: NEGATIVE mg/dL
Leukocytes,Ua: NEGATIVE
Nitrite: NEGATIVE
Protein, ur: NEGATIVE mg/dL
Specific Gravity, Urine: 1.015 (ref 1.005–1.030)
pH: 5 (ref 5.0–8.0)

## 2023-11-25 LAB — TROPONIN I (HIGH SENSITIVITY): Troponin I (High Sensitivity): 3 ng/L (ref <18)

## 2023-11-25 MED ORDER — HYDROMORPHONE HCL 1 MG/ML IJ SOLN
0.5000 mg | INTRAMUSCULAR | Status: DC | PRN
  Administered 2023-11-25 – 2023-11-26 (×5): 1 mg via INTRAVENOUS
  Filled 2023-11-25 (×6): qty 1

## 2023-11-25 MED ORDER — NICOTINE 21 MG/24HR TD PT24: 21.0000 mg | MEDICATED_PATCH | Freq: Every day | TRANSDERMAL | Status: DC | PRN

## 2023-11-25 MED ORDER — MORPHINE SULFATE (PF) 4 MG/ML IV SOLN: 4.0000 mg | INTRAVENOUS | Status: DC | PRN

## 2023-11-25 MED ORDER — FOLIC ACID 1 MG PO TABS
1.0000 mg | ORAL_TABLET | Freq: Every day | ORAL | Status: DC
  Administered 2023-11-25 – 2023-12-01 (×7): 1 mg via ORAL
  Filled 2023-11-25 (×7): qty 1

## 2023-11-25 MED ORDER — METRONIDAZOLE 500 MG/100ML IV SOLN
500.0000 mg | Freq: Two times a day (BID) | INTRAVENOUS | Status: DC
  Administered 2023-11-25 – 2023-11-26 (×2): 500 mg via INTRAVENOUS
  Filled 2023-11-25 (×2): qty 100

## 2023-11-25 MED ORDER — OXYCODONE HCL 5 MG PO TABS
5.0000 mg | ORAL_TABLET | ORAL | Status: DC | PRN
  Filled 2023-11-25: qty 1

## 2023-11-25 MED ORDER — CHLORDIAZEPOXIDE HCL 25 MG PO CAPS
25.0000 mg | ORAL_CAPSULE | Freq: Every day | ORAL | Status: AC
  Administered 2023-11-27: 25 mg via ORAL
  Filled 2023-11-25: qty 1

## 2023-11-25 MED ORDER — PANTOPRAZOLE SODIUM 40 MG PO TBEC
40.0000 mg | DELAYED_RELEASE_TABLET | Freq: Two times a day (BID) | ORAL | Status: DC
  Administered 2023-11-25 – 2023-12-01 (×12): 40 mg via ORAL
  Filled 2023-11-25 (×12): qty 1

## 2023-11-25 MED ORDER — FLEET ENEMA RE ENEM: 1.0000 | ENEMA | Freq: Once | RECTAL | Status: DC | PRN

## 2023-11-25 MED ORDER — PANCRELIPASE (LIP-PROT-AMYL) 36000-114000 UNITS PO CPEP
36000.0000 [IU] | ORAL_CAPSULE | Freq: Three times a day (TID) | ORAL | Status: DC
  Administered 2023-11-25 – 2023-12-01 (×17): 36000 [IU] via ORAL
  Filled 2023-11-25 (×18): qty 1

## 2023-11-25 MED ORDER — CHLORDIAZEPOXIDE HCL 25 MG PO CAPS
25.0000 mg | ORAL_CAPSULE | Freq: Three times a day (TID) | ORAL | Status: AC
  Administered 2023-11-25 – 2023-11-26 (×3): 25 mg via ORAL
  Filled 2023-11-25 (×4): qty 1

## 2023-11-25 MED ORDER — LACTATED RINGERS IV SOLN: INTRAVENOUS | Status: DC

## 2023-11-25 MED ORDER — SENNOSIDES-DOCUSATE SODIUM 8.6-50 MG PO TABS
1.0000 | ORAL_TABLET | Freq: Every evening | ORAL | Status: DC | PRN
Start: 2023-11-25 — End: 2023-12-01
  Administered 2023-11-25 – 2023-11-29 (×2): 1 via ORAL
  Filled 2023-11-25 (×2): qty 1

## 2023-11-25 MED ORDER — METOCLOPRAMIDE HCL 5 MG/ML IJ SOLN
10.0000 mg | Freq: Four times a day (QID) | INTRAMUSCULAR | Status: DC | PRN
  Administered 2023-11-25: 10 mg via INTRAVENOUS
  Filled 2023-11-25: qty 2

## 2023-11-25 MED ORDER — ONDANSETRON HCL 4 MG/2ML IJ SOLN
4.0000 mg | Freq: Four times a day (QID) | INTRAMUSCULAR | Status: DC | PRN
  Administered 2023-11-28 – 2023-11-30 (×2): 4 mg via INTRAVENOUS
  Filled 2023-11-25 (×3): qty 2

## 2023-11-25 MED ORDER — LORAZEPAM 2 MG/ML IJ SOLN
1.0000 mg | INTRAMUSCULAR | Status: AC | PRN
  Administered 2023-11-25: 2 mg via INTRAVENOUS
  Administered 2023-11-25: 1 mg via INTRAVENOUS
  Administered 2023-11-25 – 2023-11-26 (×3): 2 mg via INTRAVENOUS
  Administered 2023-11-26: 1 mg via INTRAVENOUS
  Administered 2023-11-26 – 2023-11-28 (×4): 2 mg via INTRAVENOUS
  Filled 2023-11-25 (×11): qty 1

## 2023-11-25 MED ORDER — FENTANYL CITRATE PF 50 MCG/ML IJ SOSY
50.0000 ug | PREFILLED_SYRINGE | Freq: Once | INTRAMUSCULAR | Status: AC
  Administered 2023-11-25: 50 ug via INTRAVENOUS
  Filled 2023-11-25: qty 1

## 2023-11-25 MED ORDER — IOHEXOL 300 MG/ML  SOLN
100.0000 mL | Freq: Once | INTRAMUSCULAR | Status: AC | PRN
  Administered 2023-11-25: 100 mL via INTRAVENOUS

## 2023-11-25 MED ORDER — ADULT MULTIVITAMIN W/MINERALS CH
1.0000 | ORAL_TABLET | Freq: Every day | ORAL | Status: DC
  Administered 2023-11-25 – 2023-12-01 (×7): 1 via ORAL
  Filled 2023-11-25 (×7): qty 1

## 2023-11-25 MED ORDER — LORAZEPAM 1 MG PO TABS
1.0000 mg | ORAL_TABLET | ORAL | Status: AC | PRN
  Administered 2023-11-27: 1 mg via ORAL
  Administered 2023-11-28: 2 mg via ORAL
  Filled 2023-11-25 (×2): qty 1
  Filled 2023-11-25: qty 2

## 2023-11-25 MED ORDER — ENOXAPARIN SODIUM 40 MG/0.4ML IJ SOSY
40.0000 mg | PREFILLED_SYRINGE | INTRAMUSCULAR | Status: DC
  Filled 2023-11-25 (×3): qty 0.4

## 2023-11-25 MED ORDER — SODIUM CHLORIDE 0.9 % IV SOLN
2.0000 g | INTRAVENOUS | Status: DC
  Administered 2023-11-25: 2 g via INTRAVENOUS
  Filled 2023-11-25: qty 20

## 2023-11-25 MED ORDER — BISACODYL 5 MG PO TBEC: 5.0000 mg | DELAYED_RELEASE_TABLET | Freq: Every day | ORAL | Status: DC | PRN

## 2023-11-25 MED ORDER — SODIUM CHLORIDE 0.9 % IV BOLUS
1000.0000 mL | Freq: Once | INTRAVENOUS | Status: AC
  Administered 2023-11-25: 1000 mL via INTRAVENOUS

## 2023-11-25 MED ORDER — ACETAMINOPHEN 650 MG RE SUPP: 650.0000 mg | Freq: Four times a day (QID) | RECTAL | Status: DC | PRN

## 2023-11-25 MED ORDER — MORPHINE SULFATE (PF) 4 MG/ML IV SOLN
4.0000 mg | Freq: Once | INTRAVENOUS | Status: AC
  Administered 2023-11-25: 4 mg via INTRAVENOUS
  Filled 2023-11-25: qty 1

## 2023-11-25 MED ORDER — METOPROLOL TARTRATE 5 MG/5ML IV SOLN: 10.0000 mg | INTRAVENOUS | Status: DC | PRN

## 2023-11-25 MED ORDER — ACETAMINOPHEN 325 MG PO TABS: 650.0000 mg | ORAL_TABLET | Freq: Four times a day (QID) | ORAL | Status: DC | PRN

## 2023-11-25 MED ORDER — ALBUTEROL SULFATE (2.5 MG/3ML) 0.083% IN NEBU: 2.5000 mg | INHALATION_SOLUTION | RESPIRATORY_TRACT | Status: DC | PRN

## 2023-11-25 MED ORDER — THIAMINE HCL 100 MG/ML IJ SOLN
100.0000 mg | Freq: Every day | INTRAMUSCULAR | Status: DC
  Administered 2023-11-25: 100 mg via INTRAVENOUS
  Filled 2023-11-25 (×2): qty 2

## 2023-11-25 MED ORDER — ONDANSETRON HCL 4 MG/2ML IJ SOLN
4.0000 mg | Freq: Once | INTRAMUSCULAR | Status: AC
  Administered 2023-11-25: 4 mg via INTRAVENOUS
  Filled 2023-11-25: qty 2

## 2023-11-25 MED ORDER — ONDANSETRON HCL 4 MG PO TABS
4.0000 mg | ORAL_TABLET | Freq: Four times a day (QID) | ORAL | Status: DC | PRN
  Administered 2023-11-27: 4 mg via ORAL
  Filled 2023-11-25: qty 1

## 2023-11-25 MED ORDER — THIAMINE MONONITRATE 100 MG PO TABS
100.0000 mg | ORAL_TABLET | Freq: Every day | ORAL | Status: DC
  Administered 2023-11-25 – 2023-12-01 (×7): 100 mg via ORAL
  Filled 2023-11-25 (×7): qty 1

## 2023-11-25 MED ORDER — CHLORDIAZEPOXIDE HCL 25 MG PO CAPS
25.0000 mg | ORAL_CAPSULE | Freq: Two times a day (BID) | ORAL | Status: AC
  Administered 2023-11-26 – 2023-11-27 (×2): 25 mg via ORAL
  Filled 2023-11-25 (×2): qty 1

## 2023-11-25 NOTE — ED Notes (Signed)
Patient transported to CT 

## 2023-11-25 NOTE — ED Notes (Signed)
Pt refused/sleep vital recheck

## 2023-11-25 NOTE — ED Notes (Signed)
Pt was advised that urine sample is needed when he came back to room 23 and reminder given again

## 2023-11-25 NOTE — ED Provider Notes (Signed)
Gary [name] EMERGENCY DEPARTMENT AT [facility] Provider Note   CSN: [id number] Arrival date & time: 11/24/23  1912     History  Chief Complaint  Patient presents with   Abdominal Pain    Gary [name] is a 38 y.o. male.   Abdominal Pain 38 year old male with history of alcohol use disorder, recurrent alcohol induced pancreatitis, seizures, HIV, polysubstance use, homelessness presenting for abdominal pain.  Pain has been ongoing since his discharge 2 days ago.  He was admitted for pancreatitis, had CT scan done at the time as well.  He states pain is even worse.  Has been ED overnight.  He drinks about 2/5 of vodka a day, last drink yesterday.  He has had nausea and vomiting without blood.  Has been constipated, no melena or hematochezia or diarrhea.  Endorses some mild chest pain since EMS picked him up.  Nonradiating, nonspecific.  No shortness of breath.  No fevers.     Home Medications Prior to Admission medications   Medication Sig Start Date End Date Taking? Authorizing Provider  bictegravir-emtricitabine-tenofovir AF (BIKTARVY) 50-200-25 MG TABS tablet Take 1 tablet by mouth daily. Patient not taking: Reported on 11/25/2023 11/16/23   Briant Cedar, MD  escitalopram (LEXAPRO) 5 MG tablet Take 1 tablet (5 mg total) by mouth at bedtime. Patient not taking: Reported on 11/25/2023 11/23/23   Alberteen Sam, MD  gabapentin (NEURONTIN) 300 MG capsule Take 1 capsule (300 mg total) by mouth at bedtime. Patient not taking: Reported on 11/25/2023 11/23/23   Alberteen Sam, MD  hydrOXYzine (ATARAX) 10 MG tablet Take 1 tablet (10 mg total) by mouth 3 (three) times daily as needed. Patient not taking: Reported on 11/25/2023 11/23/23   Alberteen Sam, MD  levETIRAcetam (KEPPRA) 500 MG tablet Take 1 tablet (500 mg total) by mouth 2 (two) times daily. Patient not taking: Reported on 11/25/2023 11/18/23   Gunnar Bulla, MD  lipase/protease/amylase (CREON)  36000 UNITS CPEP capsule Take 1 capsule (36,000 Units total) by mouth 3 (three) times daily before meals. Patient not taking: Reported on 11/25/2023 11/23/23   Alberteen Sam, MD  ondansetron (ZOFRAN-ODT) 4 MG disintegrating tablet Dissolve 1 tablet (4 mg total) by mouth every 8 (eight) hours as needed for nausea or vomiting. Patient not taking: Reported on 11/25/2023 11/18/23   Gunnar Bulla, MD  oxyCODONE (OXY IR/ROXICODONE) 5 MG immediate release tablet Take 5 mg by mouth every 6 (six) hours as needed for severe pain (pain score 7-10). Patient not taking: Reported on 11/25/2023    [provider]  pantoprazole (PROTONIX) 40 MG tablet Take 40 mg by mouth daily. Patient not taking: Reported on 11/25/2023    [provider]  sucralfate (CARAFATE) 1 g tablet Take 1 g by mouth 4 (four) times daily. Patient not taking: Reported on 11/25/2023    [provider]  thiamine (VITAMIN B-1) 100 MG tablet Take 100 mg by mouth daily. Patient not taking: Reported on 11/25/2023    [provider]  famotidine (PEPCID) 20 MG tablet Take 1 tablet (20 mg total) by mouth 2 (two) times daily. Patient not taking: Reported on 06/01/2019 05/14/19 06/02/19  Benjiman Core, MD      Allergies    Tegretol [carbamazepine] and Caffeine    Review of Systems   Review of Systems  Gastrointestinal:  Positive for abdominal pain.  Review of systems completed and notable as per HPI.  ROS otherwise negative.   Physical Exam Updated Vital  Signs BP 111/77   Pulse 68   Temp 98.3 F (36.8 C) (Oral)   Resp 16   SpO2 100%  Physical Exam Vitals and nursing note reviewed.  Constitutional:      General: He is not in acute distress.    Appearance: He is well-developed.  HENT:     Head: Normocephalic and atraumatic.     Mouth/Throat:     Mouth: Mucous membranes are moist.     Pharynx: Oropharynx is clear.  Eyes:     Extraocular Movements: Extraocular movements intact.      Conjunctiva/sclera: Conjunctivae normal.     Pupils: Pupils are equal, round, and reactive to light.  Cardiovascular:     Rate and Rhythm: Normal rate and regular rhythm.     Pulses: Normal pulses.     Heart sounds: Normal heart sounds. No murmur heard. Pulmonary:     Effort: Pulmonary effort is normal. No respiratory distress.     Breath sounds: Normal breath sounds.  Abdominal:     Palpations: Abdomen is soft.     Tenderness: There is generalized abdominal tenderness and tenderness in the epigastric area. There is no right CVA tenderness, left CVA tenderness or guarding.  Musculoskeletal:        General: No swelling.     Cervical back: Neck supple.     Right lower leg: No edema.     Left lower leg: No edema.  Skin:    General: Skin is warm and dry.     Capillary Refill: Capillary refill takes less than 2 seconds.  Neurological:     General: No focal deficit present.     Mental Status: He is alert and oriented to person, place, and time. Mental status is at baseline.  Psychiatric:        Mood and Affect: Mood normal.     ED Results / Procedures / Treatments   Labs (all labs ordered are listed, but only abnormal results are displayed) Labs Reviewed  LIPASE, BLOOD - Abnormal; Notable for the following components:      Result Value   Lipase 399 (*)    All other components within normal limits  COMPREHENSIVE METABOLIC PANEL - Abnormal; Notable for the following components:   Chloride 97 (*)    Total Protein 8.5 (*)    All other components within normal limits  ETHANOL - Abnormal; Notable for the following components:   Alcohol, Ethyl (B) 216 (*)    All other components within normal limits  CBC  URINALYSIS, ROUTINE W REFLEX MICROSCOPIC  TROPONIN I (HIGH SENSITIVITY)  TROPONIN I (HIGH SENSITIVITY)    EKG EKG Interpretation Date/Time:  Thursday November 25 2023 12:07:12 EST Ventricular Rate:  73 PR Interval:  142 QRS Duration:  93 QT Interval:  452 QTC  Calculation: 499 R Axis:   76  Text Interpretation: Sinus rhythm Probable anteroseptal infarct, old Confirmed by Fulton Reek [phone]) on 11/25/2023 12:31:09 PM  Radiology CT ABDOMEN PELVIS W CONTRAST Result Date: 11/25/2023 CLINICAL DATA:  Upper abdominal pain EXAM: CT ABDOMEN AND PELVIS WITH CONTRAST TECHNIQUE: Multidetector CT imaging of the abdomen and pelvis was performed using the standard protocol following bolus administration of intravenous contrast. RADIATION DOSE REDUCTION: This exam was performed according to the departmental dose-optimization program which includes automated exposure control, adjustment of the mA and/or kV according to patient size and/or use of iterative reconstruction technique. CONTRAST:  OMNIPAQUE IOHEXOL 300 MG/ML  SOLN COMPARISON:  11/18/2023 FINDINGS: Lower chest: Stable mild  elevation of the left hemidiaphragm. Hepatobiliary: Diffuse low-density throughout the liver compatible with fatty infiltration. No focal abnormality. Gallbladder unremarkable. Pancreas: Continued fluid/stranding around the pancreatic head and uncinate process. Truncation of the pancreatic tail. Mild pancreatic ductal dilatation. Focal fluid collection in the pancreatic head measuring 1.3 cm, unchanged. Areas of decreased perfusion in the pancreatic head are stable and may reflect necrosis. Spleen: No focal abnormality.  Normal size. Adrenals/Urinary Tract: No suspicious renal or adrenal lesion. No stones or hydronephrosis. Urinary bladder decompressed, unremarkable. Stomach/Bowel: Normal appendix. Stomach, large and small bowel grossly unremarkable. Vascular/Lymphatic: No evidence of aneurysm or adenopathy. Reproductive: No visible focal abnormality. Other: No free fluid or free air. Musculoskeletal: No acute bony abnormality. IMPRESSION: Similar changes of acute pancreatitis. 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 Stable areas of decreased perfusion in the pancreatic head which may  reflect areas of necrosis. Hepatic steatosis. Electronically Signed   By: Charlett Nose M.D.   On: 11/25/2023 11:10   DG Chest 2 View Result Date: 11/25/2023 CLINICAL DATA:  Chest pain, upper abdominal pain EXAM: CHEST - 2 VIEW COMPARISON:  11/06/2023 FINDINGS: Heart and mediastinal contours are within normal limits. No focal opacities or effusions. No acute bony abnormality. IMPRESSION: No active cardiopulmonary disease. Electronically Signed   By: Charlett Nose M.D.   On: 11/25/2023 11:06    Procedures Procedures    Medications Ordered in ED Medications  pantoprazole (PROTONIX) EC tablet 40 mg (has no administration in time range)  lipase/protease/amylase (CREON) capsule 36,000 Units (has no administration in time range)  LORazepam (ATIVAN) tablet 1-4 mg ( Oral See Alternative 11/25/23 1532)    Or  LORazepam (ATIVAN) injection 1-4 mg (1 mg Intravenous Given 11/25/23 1532)  thiamine (VITAMIN B1) tablet 100 mg (100 mg Oral Patient Refused/Not Given 11/25/23 1548)    Or  thiamine (VITAMIN B1) injection 100 mg ( Intravenous See Alternative 11/25/23 1548)  folic acid (FOLVITE) tablet 1 mg (1 mg Oral Patient Refused/Not Given 11/25/23 1549)  multivitamin with minerals tablet 1 tablet (1 tablet Oral Patient Refused/Not Given 11/25/23 1548)  chlordiazePOXIDE (LIBRIUM) capsule 25 mg (25 mg Oral Given 11/25/23 1543)    Followed by  chlordiazePOXIDE (LIBRIUM) capsule 25 mg (has no administration in time range)    Followed by  chlordiazePOXIDE (LIBRIUM) capsule 25 mg (has no administration in time range)  acetaminophen (TYLENOL) tablet 650 mg (has no administration in time range)    Or  acetaminophen (TYLENOL) suppository 650 mg (has no administration in time range)  oxyCODONE (Oxy IR/ROXICODONE) immediate release tablet 5 mg (has no administration in time range)  HYDROmorphone (DILAUDID) injection 0.5-1 mg (has no administration in time range)  senna-docusate (Senokot-S) tablet 1 tablet (has no  administration in time range)  bisacodyl (DULCOLAX) EC tablet 5 mg (has no administration in time range)  sodium phosphate (FLEET) enema 1 enema (has no administration in time range)  ondansetron (ZOFRAN) tablet 4 mg (has no administration in time range)    Or  ondansetron (ZOFRAN) injection 4 mg (has no administration in time range)  albuterol (PROVENTIL) (2.5 MG/3ML) 0.083% nebulizer solution 2.5 mg (has no administration in time range)  nicotine (NICODERM CQ - dosed in mg/24 hours) patch 21 mg (has no administration in time range)  metoprolol tartrate (LOPRESSOR) injection 10 mg (has no administration in time range)  enoxaparin (LOVENOX) injection 40 mg (has no administration in time range)  lactated ringers infusion ( Intravenous New Bag/Given 11/25/23 1625)  cefTRIAXone (ROCEPHIN) 2 g in  sodium chloride 0.9 % 100 mL IVPB (0 g Intravenous Stopped 11/25/23 1619)  metroNIDAZOLE (FLAGYL) IVPB 500 mg (has no administration in time range)  metoCLOPramide (REGLAN) injection 10 mg (10 mg Intravenous Given 11/25/23 1625)  sodium chloride 0.9 % bolus 1,000 mL (0 mLs Intravenous Stopped 11/25/23 1414)  fentaNYL (SUBLIMAZE) injection 50 mcg (50 mcg Intravenous Given 11/25/23 1039)  ondansetron (ZOFRAN) injection 4 mg (4 mg Intravenous Given 11/25/23 1039)  iohexol (OMNIPAQUE) 300 MG/ML solution 100 mL (100 mLs Intravenous Contrast Given 11/25/23 1056)  morphine (PF) 4 MG/ML injection 4 mg (4 mg Intravenous Given 11/25/23 1136)    ED Course/ Medical Decision Making/ A&P                                 Medical Decision Making Amount and/or Complexity of Data Reviewed Labs: ordered. Radiology: ordered.  Risk Prescription drug management. Decision regarding hospitalization.   Medical Decision Making:   Gary [name] is a 38 y.o. male who presented to the ED today with recurrent abdominal pain.  He already had lab work done notable for elevated lipase, suspect he has continued recurrent alcoholic  pancreatitis.  He does not appear to be withdrawing at this time.  However he is quite tender on exam, obtain CT scan to rule out complications and try to control his pain.   Patient placed on continuous vitals and telemetry monitoring while in ED which was reviewed periodically.  Reviewed and confirmed nursing documentation for past medical history, family history, social history.  Reassessment and Plan:   Patient's pain has persisted.  CT scan shows stable findings of pancreatitis with some necrosis.  Necrosis been ongoing for about a month and is unchanged.  His pain is not well-controlled is not tolerating p.o.  He needs admission.  I talked with the hospitalist.  He requested I talk with gastroenterology.  Spoke with [facility] who recommended symptomatic treatment for pancreatitis.  No indication for further intervention.  Admitted.   Patient's presentation is most consistent with acute complicated illness / injury requiring diagnostic workup.           Final Clinical Impression(s) / ED Diagnoses Final diagnoses:  Alcohol-induced acute pancreatitis with uninfected necrosis    Rx / DC Orders ED Discharge Orders     None         Laurence Spates, MD 11/25/23 1729

## 2023-11-25 NOTE — H&P (Signed)
History and Physical    Gary [name] WUX:[id number] DOB: 02/12/1986 DOA: 11/24/2023  PCP: Patient, No Pcp Per Patient coming from: Home  Chief Complaint: Abdominal pain  HPI: Gary [name] is a 38 y.o. male with medical history significant of alcohol abuse, substance abuse, noncompliant HIV comes to the hospital with complaints of abdominal pain.  Patient was admitted to the hospital from 1/9 - 1/14 for alcohol induced acute pancreatitis and discharged.  After going home states he started drinking vodka again and his last drink was yesterday.  Started having abdominal pain yesterday afternoon along with feeling of nausea and vomiting therefore comes to the hospital.  Upon admission lipase again noted to be mildly elevated at 399 with CT scan showing evidence of acute pancreatitis with possible area of necrosis.  EDP has consulted [facility], will admit the patient.   Review of Systems: As per HPI otherwise 10 point review of systems negative.  Review of Systems Otherwise negative except as per HPI, including: General: Denies fever, chills, night sweats or unintended weight loss. Resp: Denies cough, wheezing, shortness of breath. Cardiac: Denies chest pain, palpitations, orthopnea, paroxysmal nocturnal dyspnea. GI: Denies constipation GU: Denies dysuria, frequency, hesitancy or incontinence MS: Denies muscle aches, joint pain or swelling Neuro: Denies headache, neurologic deficits (focal weakness, numbness, tingling), abnormal gait Psych: Denies anxiety, depression, SI/HI/AVH Skin: Denies new rashes or lesions ID: Denies sick contacts, exotic exposures, travel  Past Medical History:  Diagnosis Date   Alcohol abuse    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   Anxiety    Bipolar 2 disorder (HCC)    HIV (human immunodeficiency virus infection) (HCC)    Pancreatitis    PTSD (post-traumatic stress disorder)    Schizophrenia (HCC)    Seizures (HCC)    Subdural hematoma (HCC)      Past Surgical History:  Procedure Laterality Date   BIOPSY  04/19/2022   Procedure: BIOPSY;  Surgeon: Lemar Lofty., MD;  Location: [facility] ENDOSCOPY;  Service: Gastroenterology;;   ENTEROSCOPY N/A 04/19/2022   Procedure: ENTEROSCOPY;  Surgeon: Lemar Lofty., MD;  Location: [facility] ENDOSCOPY;  Service: Gastroenterology;  Laterality: N/A;   INCISION AND DRAINAGE PERIRECTAL ABSCESS N/A 09/24/2016   Procedure: IRRIGATION AND DEBRIDEMENT PERIRECTAL ABSCESS;  Surgeon: Ricarda Frame, MD;  Location: [facility] ORS;  Service: General;  Laterality: N/A;   none      SOCIAL HISTORY:  reports that he has been smoking cigarettes. He started smoking about 21 years ago. He has a 10.5 pack-year smoking history. He has never used smokeless tobacco. He reports current alcohol use of about 105.0 standard drinks of alcohol per week. He reports current drug use. Drugs: Methamphetamines and Cocaine.  Allergies  Allergen Reactions   Tegretol [Carbamazepine] Other (See Comments)    Vertigo and paralysis   Caffeine Palpitations    FAMILY HISTORY: Family History  Problem Relation Age of Onset   Alcohol abuse Mother    Alcohol abuse Father    Colon cancer Other    Other Other    Cancer Other      Prior to Admission medications   Medication Sig Start Date End Date Taking? Authorizing Provider  bictegravir-emtricitabine-tenofovir AF (BIKTARVY) 50-200-25 MG TABS tablet Take 1 tablet by mouth daily. Patient not taking: Reported on 11/25/2023 11/16/23   Briant Cedar, MD  escitalopram (LEXAPRO) 5 MG tablet Take 1 tablet (5 mg total) by mouth at bedtime. Patient not taking: Reported on 11/25/2023 11/23/23   Alberteen Sam, MD  gabapentin (NEURONTIN) 300 MG capsule Take 1 capsule (300 mg total) by mouth at bedtime. Patient not taking: Reported on 11/25/2023 11/23/23   Alberteen Sam, MD  hydrOXYzine (ATARAX) 10 MG tablet Take 1 tablet (10 mg total) by mouth 3 (three) times daily as  needed. Patient not taking: Reported on 11/25/2023 11/23/23   Alberteen Sam, MD  levETIRAcetam (KEPPRA) 500 MG tablet Take 1 tablet (500 mg total) by mouth 2 (two) times daily. Patient not taking: Reported on 11/25/2023 11/18/23   Gunnar Bulla, MD  lipase/protease/amylase (CREON) 36000 UNITS CPEP capsule Take 1 capsule (36,000 Units total) by mouth 3 (three) times daily before meals. Patient not taking: Reported on 11/25/2023 11/23/23   Alberteen Sam, MD  ondansetron (ZOFRAN-ODT) 4 MG disintegrating tablet Dissolve 1 tablet (4 mg total) by mouth every 8 (eight) hours as needed for nausea or vomiting. Patient not taking: Reported on 11/25/2023 11/18/23   Gunnar Bulla, MD  oxyCODONE (OXY IR/ROXICODONE) 5 MG immediate release tablet Take 5 mg by mouth every 6 (six) hours as needed for severe pain (pain score 7-10). Patient not taking: Reported on 11/25/2023    [provider]  pantoprazole (PROTONIX) 40 MG tablet Take 40 mg by mouth daily. Patient not taking: Reported on 11/25/2023    [provider]  sucralfate (CARAFATE) 1 g tablet Take 1 g by mouth 4 (four) times daily. Patient not taking: Reported on 11/25/2023    [provider]  thiamine (VITAMIN B-1) 100 MG tablet Take 100 mg by mouth daily. Patient not taking: Reported on 11/25/2023    [provider]  famotidine (PEPCID) 20 MG tablet Take 1 tablet (20 mg total) by mouth 2 (two) times daily. Patient not taking: Reported on 06/01/2019 05/14/19 06/02/19  Benjiman Core, MD    Physical Exam: Vitals:   11/25/23 1130 11/25/23 1200 11/25/23 1209 11/25/23 1311  BP: 102/80 104/74 104/74   Pulse: 78 80 80   Resp: 15 15    Temp:    98.3 F (36.8 C)  TempSrc:    Oral  SpO2: 98% 100%        Constitutional: NAD, calm, comfortable Eyes: PERRL, lids and conjunctivae normal ENMT: Mucous membranes are moist. Posterior pharynx clear of any exudate or lesions.Normal dentition.  Neck: normal, supple, no  masses, no thyromegaly Respiratory: clear to auscultation bilaterally, no wheezing, no crackles. Normal respiratory effort. No accessory muscle use.  Cardiovascular: Regular rate and rhythm, no murmurs / rubs / gallops. No extremity edema. 2+ pedal pulses. No carotid bruits.  Abdomen: no tenderness, no masses palpated. No hepatosplenomegaly. Bowel sounds positive.  Musculoskeletal: no clubbing / cyanosis. No joint deformity upper and lower extremities. Good ROM, no contractures. Normal muscle tone.  Skin: no rashes, lesions, ulcers. No induration Neurologic: CN 2-12 grossly intact. Sensation intact, DTR normal. Strength 5/5 in all 4.  Psychiatric: Normal judgment and insight. Alert and oriented x 3. Normal mood.    There is no height or weight on file to calculate BMI.      Labs on Admission: I have personally reviewed following labs and imaging studies  CBC: Recent Labs  Lab 11/20/23 0636 11/21/23 0652 11/22/23 0556 11/23/23 0839 11/24/23 1954  WBC 3.1* 2.8* 3.6* 4.1 8.3  HGB 10.4* 10.2* 11.0* 12.8* 13.6  HCT 30.7* 30.4* 33.2* 37.7* 40.8  MCV 91.4 90.5 90.7 89.5 91.3  PLT 221 225 269 254 358   Basic Metabolic Panel: Recent Labs  Lab 11/20/23 0636 11/21/23 0652 11/22/23  5732 11/23/23 0839 11/24/23 1954  NA 137 136 136 134* 135  K 4.1 3.5 4.3 4.5 3.6  CL 103 102 101 100 97*  CO2 26 26 28 26 25   GLUCOSE 98 91 87 93 90  BUN <5* <5* <5* 9 18  CREATININE 0.64 0.57* 0.62 0.80 0.70  CALCIUM 9.0 8.9 9.9 9.5 9.0   GFR: Estimated Creatinine Clearance: 121.6 mL/min (by C-G formula based on SCr of 0.7 mg/dL). Liver Function Tests: Recent Labs  Lab 11/18/23 2020 11/20/23 0636 11/21/23 0652 11/23/23 0839 11/24/23 1954  AST 22 15 17 17 18   ALT 16 13 13 12 13   ALKPHOS 48 50 46 47 61  BILITOT <0.2 0.5 0.5 0.6 0.4  PROT 7.0 5.8* 6.0* 6.8 8.5*  ALBUMIN 3.5 2.9* 3.0* 3.4* 4.2   Recent Labs  Lab 11/20/23 0636 11/21/23 0652 11/22/23 0556 11/23/23 0839 11/24/23 1954   LIPASE 344* 302* 328* 304* 399*   No results for input(s): "AMMONIA" in the last 168 hours. Coagulation Profile: No results for input(s): "INR", "PROTIME" in the last 168 hours. Cardiac Enzymes: No results for input(s): "CKTOTAL", "CKMB", "CKMBINDEX", "TROPONINI" in the last 168 hours. BNP (last 3 results) No results for input(s): "PROBNP" in the last 8760 hours. HbA1C: No results for input(s): "HGBA1C" in the last 72 hours. CBG: No results for input(s): "GLUCAP" in the last 168 hours. Lipid Profile: No results for input(s): "CHOL", "HDL", "LDLCALC", "TRIG", "CHOLHDL", "LDLDIRECT" in the last 72 hours. Thyroid Function Tests: No results for input(s): "TSH", "T4TOTAL", "FREET4", "T3FREE", "THYROIDAB" in the last 72 hours. Anemia Panel: No results for input(s): "VITAMINB12", "FOLATE", "FERRITIN", "TIBC", "IRON", "RETICCTPCT" in the last 72 hours. Urine analysis:    Component Value Date/Time   COLORURINE YELLOW 11/19/2023 0644   APPEARANCEUR CLEAR 11/19/2023 0644   APPEARANCEUR Clear 11/15/2014 1755   LABSPEC 1.012 11/19/2023 0644   LABSPEC 1.002 11/15/2014 1755   PHURINE 6.0 11/19/2023 0644   GLUCOSEU NEGATIVE 11/19/2023 0644   GLUCOSEU Negative 11/15/2014 1755   HGBUR NEGATIVE 11/19/2023 0644   BILIRUBINUR NEGATIVE 11/19/2023 0644   BILIRUBINUR Negative 11/15/2014 1755   KETONESUR NEGATIVE 11/19/2023 0644   PROTEINUR NEGATIVE 11/19/2023 0644   NITRITE NEGATIVE 11/19/2023 0644   LEUKOCYTESUR NEGATIVE 11/19/2023 0644   LEUKOCYTESUR Negative 11/15/2014 1755   Sepsis Labs: !!!!!!!!!!!!!!!!!!!!!!!!!!!!!!!!!!!!!!!!!!!! @LABRCNTIP (procalcitonin:4,lacticidven:4) )No results found for this or any previous visit (from the past 240 hours).   Radiological Exams on Admission: CT ABDOMEN PELVIS W CONTRAST Result Date: 11/25/2023 CLINICAL DATA:  Upper abdominal pain EXAM: CT ABDOMEN AND PELVIS WITH CONTRAST TECHNIQUE: Multidetector CT imaging of the abdomen and pelvis was performed  using the standard protocol following bolus administration of intravenous contrast. RADIATION DOSE REDUCTION: This exam was performed according to the departmental dose-optimization program which includes automated exposure control, adjustment of the mA and/or kV according to patient size and/or use of iterative reconstruction technique. CONTRAST:  OMNIPAQUE IOHEXOL 300 MG/ML  SOLN COMPARISON:  11/18/2023 FINDINGS: Lower chest: Stable mild elevation of the left hemidiaphragm. Hepatobiliary: Diffuse low-density throughout the liver compatible with fatty infiltration. No focal abnormality. Gallbladder unremarkable. Pancreas: Continued fluid/stranding around the pancreatic head and uncinate process. Truncation of the pancreatic tail. Mild pancreatic ductal dilatation. Focal fluid collection in the pancreatic head measuring 1.3 cm, unchanged. Areas of decreased perfusion in the pancreatic head are stable and may reflect necrosis. Spleen: No focal abnormality.  Normal size. Adrenals/Urinary Tract: No suspicious renal or adrenal lesion. No stones or hydronephrosis. Urinary bladder decompressed, unremarkable. Stomach/Bowel:  Normal appendix. Stomach, large and small bowel grossly unremarkable. Vascular/Lymphatic: No evidence of aneurysm or adenopathy. Reproductive: No visible focal abnormality. Other: No free fluid or free air. Musculoskeletal: No acute bony abnormality. IMPRESSION: Similar changes of acute pancreatitis. 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 Stable areas of decreased perfusion in the pancreatic head which may reflect areas of necrosis. Hepatic steatosis. Electronically Signed   By: Charlett Nose M.D.   On: 11/25/2023 11:10   DG Chest 2 View Result Date: 11/25/2023 CLINICAL DATA:  Chest pain, upper abdominal pain EXAM: CHEST - 2 VIEW COMPARISON:  11/06/2023 FINDINGS: Heart and mediastinal contours are within normal limits. No focal opacities or effusions. No acute bony abnormality.  IMPRESSION: No active cardiopulmonary disease. Electronically Signed   By: Charlett Nose M.D.   On: 11/25/2023 11:06    Nutritional status  Assessment/Plan Principal Problem:   Acute pancreatitis Active Problems:   Polysubstance abuse (HCC)   Homelessness   Alcohol induced acute pancreatitis   Acute alcoholic pancreatitis with possible area of necrosis Alcohol abuse -Currently on clear liquid diet, slowly advance as tolerated, IV fluids, pain control, bowel regimen.  Empiric IV meropenem.  [facility] consulted for their input - Librium taper, alcohol withdrawal protocol, multivitamin, folic acid and thiamine  HIV  -Has not been taking his Biktarvy  History of depression Peripheral neuropathy/seizure - Denies taking Lexapro, gabapentin, Keppra.     DVT prophylaxis: Lovenox Code Status: Full code code Family Communication:  Consults called: [facility] Admission status: MedSurg  Status is: Observation The patient remains OBS appropriate and will d/c before 2 midnights.   Time Spent: 65 minutes.  >50% of the time was devoted to discussing the patients care, assessment, plan and disposition with other care givers along with counseling the patient about the risks and benefits of treatment.    [name] MD [facility]  If 7PM-7AM, please contact night-coverage   11/25/2023, 1:46 PM

## 2023-11-25 NOTE — ED Notes (Signed)
Nurse received pt resting in bed requesting pain meds. Pt stated he had 9/10 abdominal pain.

## 2023-11-25 NOTE — ED Notes (Signed)
Pt had a [place] sandwich. Nurse overlooked clear liquid diet. MD made aware. Pt denies any nausea and vomiting.

## 2023-11-26 DIAGNOSIS — Z800 Family history of malignant neoplasm of digestive organs: Secondary | ICD-10-CM | POA: Diagnosis not present

## 2023-11-26 DIAGNOSIS — Y907 Blood alcohol level of 200-239 mg/100 ml: Secondary | ICD-10-CM | POA: Diagnosis present

## 2023-11-26 DIAGNOSIS — F17210 Nicotine dependence, cigarettes, uncomplicated: Secondary | ICD-10-CM | POA: Diagnosis present

## 2023-11-26 DIAGNOSIS — Z681 Body mass index (BMI) 19 or less, adult: Secondary | ICD-10-CM | POA: Diagnosis not present

## 2023-11-26 DIAGNOSIS — Z888 Allergy status to other drugs, medicaments and biological substances status: Secondary | ICD-10-CM | POA: Diagnosis not present

## 2023-11-26 DIAGNOSIS — K8500 Idiopathic acute pancreatitis without necrosis or infection: Secondary | ICD-10-CM

## 2023-11-26 DIAGNOSIS — F3181 Bipolar II disorder: Secondary | ICD-10-CM | POA: Diagnosis present

## 2023-11-26 DIAGNOSIS — F4310 Post-traumatic stress disorder, unspecified: Secondary | ICD-10-CM | POA: Diagnosis present

## 2023-11-26 DIAGNOSIS — K8521 Alcohol induced acute pancreatitis with uninfected necrosis: Secondary | ICD-10-CM | POA: Diagnosis present

## 2023-11-26 DIAGNOSIS — Z91199 Patient's noncompliance with other medical treatment and regimen due to unspecified reason: Secondary | ICD-10-CM | POA: Diagnosis not present

## 2023-11-26 DIAGNOSIS — R54 Age-related physical debility: Secondary | ICD-10-CM | POA: Diagnosis present

## 2023-11-26 DIAGNOSIS — Z5900 Homelessness unspecified: Secondary | ICD-10-CM | POA: Diagnosis not present

## 2023-11-26 DIAGNOSIS — Z21 Asymptomatic human immunodeficiency virus [HIV] infection status: Secondary | ICD-10-CM | POA: Diagnosis present

## 2023-11-26 DIAGNOSIS — Z79899 Other long term (current) drug therapy: Secondary | ICD-10-CM | POA: Diagnosis not present

## 2023-11-26 DIAGNOSIS — F1010 Alcohol abuse, uncomplicated: Secondary | ICD-10-CM | POA: Diagnosis present

## 2023-11-26 DIAGNOSIS — F10939 Alcohol use, unspecified with withdrawal, unspecified: Secondary | ICD-10-CM | POA: Diagnosis present

## 2023-11-26 DIAGNOSIS — R569 Unspecified convulsions: Secondary | ICD-10-CM | POA: Diagnosis present

## 2023-11-26 DIAGNOSIS — R64 Cachexia: Secondary | ICD-10-CM | POA: Diagnosis present

## 2023-11-26 DIAGNOSIS — F209 Schizophrenia, unspecified: Secondary | ICD-10-CM | POA: Diagnosis present

## 2023-11-26 DIAGNOSIS — Z811 Family history of alcohol abuse and dependence: Secondary | ICD-10-CM | POA: Diagnosis not present

## 2023-11-26 DIAGNOSIS — G629 Polyneuropathy, unspecified: Secondary | ICD-10-CM | POA: Diagnosis present

## 2023-11-26 DIAGNOSIS — K5900 Constipation, unspecified: Secondary | ICD-10-CM | POA: Diagnosis present

## 2023-11-26 DIAGNOSIS — F1910 Other psychoactive substance abuse, uncomplicated: Secondary | ICD-10-CM | POA: Diagnosis present

## 2023-11-26 DIAGNOSIS — K859 Acute pancreatitis without necrosis or infection, unspecified: Secondary | ICD-10-CM | POA: Diagnosis present

## 2023-11-26 LAB — COMPREHENSIVE METABOLIC PANEL
ALT: 11 U/L (ref 0–44)
AST: 15 U/L (ref 15–41)
Albumin: 3.2 g/dL — ABNORMAL LOW (ref 3.5–5.0)
Alkaline Phosphatase: 43 U/L (ref 38–126)
Anion gap: 7 (ref 5–15)
BUN: 9 mg/dL (ref 6–20)
CO2: 24 mmol/L (ref 22–32)
Calcium: 8.6 mg/dL — ABNORMAL LOW (ref 8.9–10.3)
Chloride: 103 mmol/L (ref 98–111)
Creatinine, Ser: 0.34 mg/dL — ABNORMAL LOW (ref 0.61–1.24)
GFR, Estimated: >60 mL/min (ref >60)
Glucose, Bld: 75 mg/dL (ref 70–99)
Potassium: 4.5 mmol/L (ref 3.5–5.1)
Sodium: 134 mmol/L — ABNORMAL LOW (ref 135–145)
Total Bilirubin: 0.9 mg/dL (ref 0.0–1.2)
Total Protein: 5.9 g/dL — ABNORMAL LOW (ref 6.5–8.1)

## 2023-11-26 LAB — CBC
HCT: 32 % — ABNORMAL LOW (ref 39.0–52.0)
Hemoglobin: 10.3 g/dL — ABNORMAL LOW (ref 13.0–17.0)
MCH: 30.2 pg (ref 26.0–34.0)
MCHC: 32.2 g/dL (ref 30.0–36.0)
MCV: 93.8 fL (ref 80.0–100.0)
Platelets: 166 10*3/uL (ref 150–400)
RBC: 3.41 MIL/uL — ABNORMAL LOW (ref 4.22–5.81)
RDW: 15.5 % (ref 11.5–15.5)
WBC: 2.9 10*3/uL — ABNORMAL LOW (ref 4.0–10.5)
nRBC: 0 % (ref 0.0–0.2)

## 2023-11-26 LAB — PHOSPHORUS: Phosphorus: 2.5 mg/dL (ref 2.5–4.6)

## 2023-11-26 LAB — MAGNESIUM: Magnesium: 2.1 mg/dL (ref 1.7–2.4)

## 2023-11-26 MED ORDER — SODIUM CHLORIDE 0.9 % IV BOLUS
1000.0000 mL | Freq: Once | INTRAVENOUS | Status: AC
  Administered 2023-11-26: 1000 mL via INTRAVENOUS

## 2023-11-26 MED ORDER — LACTATED RINGERS IV SOLN: INTRAVENOUS | Status: AC

## 2023-11-26 MED ORDER — HYDRALAZINE HCL 20 MG/ML IJ SOLN: 10.0000 mg | INTRAMUSCULAR | Status: DC | PRN

## 2023-11-26 MED ORDER — HYDROMORPHONE HCL 1 MG/ML IJ SOLN
0.5000 mg | INTRAMUSCULAR | Status: DC | PRN
  Administered 2023-11-26 – 2023-11-27 (×4): 0.5 mg via INTRAVENOUS
  Filled 2023-11-26 (×4): qty 0.5

## 2023-11-26 NOTE — Hospital Course (Addendum)
Brief Narrative:    38 y.o. male with medical history significant of alcohol abuse, substance abuse, noncompliant HIV comes to the hospital with complaints of abdominal pain.  Patient was admitted to the hospital from 1/9 - 1/14 for alcohol induced acute pancreatitis and discharged.   Assessment & Plan:  Principal Problem:   Acute pancreatitis Active Problems:   Polysubstance abuse (HCC)   Homelessness   Alcohol induced acute pancreatitis     Acute alcoholic pancreatitis Alcohol abuse - Currently on Librium taper and alcohol withdrawal protocol.  Continue multivitamin, folic acid and thiamine - Advance diet as tolerated, IV fluids, pain control.  Upon further review with [facility] (Dr Ewing Schlein), no evidence of necrosis at this time therefore we will discontinue Abx   HIV  -Has not been taking his Biktarvy. Follows at [facility]. Noncompliant.    History of depression Peripheral neuropathy/seizure - Denies taking Lexapro, gabapentin, Keppra.         DVT prophylaxis: Lovenox Code Status: Full code code Family Communication:  Admission status: MedSurg   Subjective: Patient is tolerating p.o., very argumentative when I reduced his pain medication dosage and frequency. He appears to be very comfortable.  I have advised him he needs to quit drinking and he keeps telling me that is not the drinking that is leading to pancreatitis but it is me not giving him pain medications and not controlling his pain therefore he has to resort to alcohol.   Examination:  General exam: Appears calm and comfortable, cachectic frail with bilateral temporal wasting.  Appears older than stated age Respiratory system: Clear to auscultation. Respiratory effort normal. Cardiovascular system: S1 & S2 heard, RRR. No JVD, murmurs, rubs, gallops or clicks. No pedal edema. Gastrointestinal system: Abdomen is nondistended, soft and nontender. No organomegaly or masses felt. Normal bowel sounds heard. Central  nervous system: Alert and oriented. No focal neurological deficits. Extremities: Symmetric 5 x 5 power. Skin: No rashes, lesions or ulcers Psychiatry: Judgement and insight appear normal. Mood & affect appropriate.

## 2023-11-26 NOTE — Progress Notes (Signed)
   11/26/23 0758  Assess: MEWS Score  Temp 97.9 F (36.6 C)  BP (!) 88/52  Pulse Rate 63  Resp (!) 24  Level of Consciousness Alert  SpO2 98 %  O2 Device Room Air  Assess: MEWS Score  MEWS Temp 0  MEWS Systolic 1  MEWS Pulse 0  MEWS RR 1  MEWS LOC 0  MEWS Score 2  MEWS Score Color Yellow  Assess: if the MEWS score is Yellow or Red  Were vital signs accurate and taken at a resting state? Yes  Does the patient meet 2 or more of the SIRS criteria? No  Notify: Charge Nurse/RN  Name of Charge Nurse/RN Notified Anderson Malta RN  Provider Notification  Provider Name/Title Dr. Nelson Chimes  Date Provider Notified 11/26/23  Time Provider Notified 0800  Method of Notification Page (secure chat)  Notification Reason Change in status  Assess: SIRS CRITERIA  SIRS Temperature  0  SIRS Respirations  1  SIRS Pulse 0  SIRS WBC 0  SIRS Score Sum  1

## 2023-11-26 NOTE — Progress Notes (Signed)
PROGRESS NOTE    Gary [name]  ZOX:[id number] DOB: 12-Jul-1986 DOA: 11/24/2023 PCP: Patient, No Pcp Per    Brief Narrative:    38 y.o. male with medical history significant of alcohol abuse, substance abuse, noncompliant HIV comes to the hospital with complaints of abdominal pain.  Patient was admitted to the hospital from 1/9 - 1/14 for alcohol induced acute pancreatitis and discharged.   Assessment & Plan:  Principal Problem:   Acute pancreatitis Active Problems:   Polysubstance abuse (HCC)   Homelessness   Alcohol induced acute pancreatitis     Acute alcoholic pancreatitis Alcohol abuse - Currently on Librium taper and alcohol withdrawal protocol.  Continue multivitamin, folic acid and thiamine - Advance diet as tolerated, IV fluids, pain control.  Upon further review with [facility] (Dr Ewing Schlein), no evidence of necrosis at this time therefore we will discontinue Abx   HIV  -Has not been taking his Biktarvy. Follows at [facility]. Noncompliant.    History of depression Peripheral neuropathy/seizure - Denies taking Lexapro, gabapentin, Keppra.         DVT prophylaxis: Lovenox Code Status: Full code code Family Communication:  Admission status: MedSurg   Subjective: Patient is tolerating p.o., very argumentative when I reduced his pain medication dosage and frequency. He appears to be very comfortable.  I have advised him he needs to quit drinking and he keeps telling me that is not the drinking that is leading to pancreatitis but it is me not giving him pain medications and not controlling his pain therefore he has to resort to alcohol.   Examination:  General exam: Appears calm and comfortable, cachectic frail with bilateral temporal wasting.  Appears older than stated age Respiratory system: Clear to auscultation. Respiratory effort normal. Cardiovascular system: S1 & S2 heard, RRR. No JVD, murmurs, rubs, gallops or clicks. No pedal edema. Gastrointestinal  system: Abdomen is nondistended, soft and nontender. No organomegaly or masses felt. Normal bowel sounds heard. Central nervous system: Alert and oriented. No focal neurological deficits. Extremities: Symmetric 5 x 5 power. Skin: No rashes, lesions or ulcers Psychiatry: Judgement and insight appear normal. Mood & affect appropriate.                Diet Orders (From admission, onward)     Start     Ordered   11/26/23 1057  Diet full liquid Room service appropriate? Yes; Fluid consistency: Thin  Diet effective now       Question Answer Comment  Room service appropriate? Yes   Fluid consistency: Thin      11/26/23 1056            Objective: Vitals:   11/26/23 0521 11/26/23 0758 11/26/23 0928 11/26/23 1033  BP: 91/60 (!) 88/52 97/62 98/64   Pulse: (!) 55 63 64 66  Resp:  (!) 24    Temp:  97.9 F (36.6 C) 97.7 F (36.5 C)   TempSrc:  Oral Oral   SpO2:  98% 100%     Intake/Output Summary (Last 24 hours) at 11/26/2023 1150 Last data filed at 11/26/2023 8119 Gross per 24 hour  Intake 2120 ml  Output 1750 ml  Net 370 ml   There were no vitals filed for this visit.  Scheduled Meds:  chlordiazePOXIDE  25 mg Oral BID   Followed by   Melene Muller ON 11/27/2023] chlordiazePOXIDE  25 mg Oral Q1500   enoxaparin (LOVENOX) injection  40 mg Subcutaneous Q24H   folic acid  1 mg Oral Daily  lipase/protease/amylase  36,000 Units Oral TID AC   multivitamin with minerals  1 tablet Oral Daily   pantoprazole  40 mg Oral BID AC   thiamine  100 mg Oral Daily   Or   thiamine  100 mg Intravenous Daily   Continuous Infusions:  lactated ringers 100 mL/hr at 11/26/23 1052    Nutritional status     There is no height or weight on file to calculate BMI.  Data Reviewed:   CBC: Recent Labs  Lab 11/20/23 0636 11/21/23 0652 11/22/23 0556 11/23/23 0839 11/24/23 1954  WBC 3.1* 2.8* 3.6* 4.1 8.3  HGB 10.4* 10.2* 11.0* 12.8* 13.6  HCT 30.7* 30.4* 33.2* 37.7* 40.8  MCV 91.4 90.5  90.7 89.5 91.3  PLT 221 225 269 254 358   Basic Metabolic Panel: Recent Labs  Lab 11/21/23 0652 11/22/23 0556 11/23/23 0839 11/24/23 1954 11/26/23 0842  NA 136 136 134* 135 134*  K 3.5 4.3 4.5 3.6 4.5  CL 102 101 100 97* 103  CO2 26 28 26 25 24   GLUCOSE 91 87 93 90 75  BUN <5* <5* 9 18 9   CREATININE 0.57* 0.62 0.80 0.70 0.34*  CALCIUM 8.9 9.9 9.5 9.0 8.6*  MG  --   --   --   --  2.1  PHOS  --   --   --   --  2.5   GFR: Estimated Creatinine Clearance: 121.6 mL/min (A) (by C-G formula based on SCr of 0.34 mg/dL (L)). Liver Function Tests: Recent Labs  Lab 11/20/23 0636 11/21/23 0652 11/23/23 0839 11/24/23 1954 11/26/23 0842  AST 15 17 17 18 15   ALT 13 13 12 13 11   ALKPHOS 50 46 47 61 43  BILITOT 0.5 0.5 0.6 0.4 0.9  PROT 5.8* 6.0* 6.8 8.5* 5.9*  ALBUMIN 2.9* 3.0* 3.4* 4.2 3.2*   Recent Labs  Lab 11/20/23 0636 11/21/23 0652 11/22/23 0556 11/23/23 0839 11/24/23 1954  LIPASE 344* 302* 328* 304* 399*   No results for input(s): "AMMONIA" in the last 168 hours. Coagulation Profile: No results for input(s): "INR", "PROTIME" in the last 168 hours. Cardiac Enzymes: No results for input(s): "CKTOTAL", "CKMB", "CKMBINDEX", "TROPONINI" in the last 168 hours. BNP (last 3 results) No results for input(s): "PROBNP" in the last 8760 hours. HbA1C: No results for input(s): "HGBA1C" in the last 72 hours. CBG: No results for input(s): "GLUCAP" in the last 168 hours. Lipid Profile: No results for input(s): "CHOL", "HDL", "LDLCALC", "TRIG", "CHOLHDL", "LDLDIRECT" in the last 72 hours. Thyroid Function Tests: No results for input(s): "TSH", "T4TOTAL", "FREET4", "T3FREE", "THYROIDAB" in the last 72 hours. Anemia Panel: No results for input(s): "VITAMINB12", "FOLATE", "FERRITIN", "TIBC", "IRON", "RETICCTPCT" in the last 72 hours. Sepsis Labs: No results for input(s): "PROCALCITON", "LATICACIDVEN" in the last 168 hours.  No results found for this or any previous visit (from  the past 240 hours).       Radiology Studies: CT ABDOMEN PELVIS W CONTRAST Result Date: 11/25/2023 CLINICAL DATA:  Upper abdominal pain EXAM: CT ABDOMEN AND PELVIS WITH CONTRAST TECHNIQUE: Multidetector CT imaging of the abdomen and pelvis was performed using the standard protocol following bolus administration of intravenous contrast. RADIATION DOSE REDUCTION: This exam was performed according to the departmental dose-optimization program which includes automated exposure control, adjustment of the mA and/or kV according to patient size and/or use of iterative reconstruction technique. CONTRAST:  OMNIPAQUE IOHEXOL 300 MG/ML  SOLN COMPARISON:  11/18/2023 FINDINGS: Lower chest: Stable mild elevation of the left  hemidiaphragm. Hepatobiliary: Diffuse low-density throughout the liver compatible with fatty infiltration. No focal abnormality. Gallbladder unremarkable. Pancreas: Continued fluid/stranding around the pancreatic head and uncinate process. Truncation of the pancreatic tail. Mild pancreatic ductal dilatation. Focal fluid collection in the pancreatic head measuring 1.3 cm, unchanged. Areas of decreased perfusion in the pancreatic head are stable and may reflect necrosis. Spleen: No focal abnormality.  Normal size. Adrenals/Urinary Tract: No suspicious renal or adrenal lesion. No stones or hydronephrosis. Urinary bladder decompressed, unremarkable. Stomach/Bowel: Normal appendix. Stomach, large and small bowel grossly unremarkable. Vascular/Lymphatic: No evidence of aneurysm or adenopathy. Reproductive: No visible focal abnormality. Other: No free fluid or free air. Musculoskeletal: No acute bony abnormality. IMPRESSION: Similar changes of acute pancreatitis. 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 Stable areas of decreased perfusion in the pancreatic head which may reflect areas of necrosis. Hepatic steatosis. Electronically Signed   By: Charlett Nose M.D.   On: 11/25/2023 11:10   DG  Chest 2 View Result Date: 11/25/2023 CLINICAL DATA:  Chest pain, upper abdominal pain EXAM: CHEST - 2 VIEW COMPARISON:  11/06/2023 FINDINGS: Heart and mediastinal contours are within normal limits. No focal opacities or effusions. No acute bony abnormality. IMPRESSION: No active cardiopulmonary disease. Electronically Signed   By: Charlett Nose M.D.   On: 11/25/2023 11:06           LOS: 0 days   Time spent= 35 mins    [name], MD [facility]  If 7PM-7AM, please contact night-coverage  11/26/2023, 11:50 AM

## 2023-11-26 NOTE — Plan of Care (Signed)
  Problem: Education: Goal: Knowledge of General Education information will improve Description: Including pain rating scale, medication(s)/side effects and non-pharmacologic comfort measures Outcome: Progressing   Problem: Health Behavior/Discharge Planning: Goal: Ability to manage health-related needs will improve Outcome: Progressing   Problem: Clinical Measurements: Goal: Ability to maintain clinical measurements within normal limits will improve Outcome: Progressing Goal: Will remain free from infection Outcome: Progressing Goal: Diagnostic test results will improve Outcome: Progressing Goal: Respiratory complications will improve Outcome: Progressing Goal: Cardiovascular complication will be avoided Outcome: Progressing   Problem: Activity: Goal: Risk for activity intolerance will decrease Outcome: Progressing   Problem: Nutrition: Goal: Adequate nutrition will be maintained Outcome: Progressing   Problem: Coping: Goal: Level of anxiety will decrease Outcome: Progressing   Problem: Elimination: Goal: Will not experience complications related to bowel motility Outcome: Progressing Goal: Will not experience complications related to urinary retention Outcome: Progressing   Problem: Pain Managment: Goal: General experience of comfort will improve and/or be controlled Outcome: Progressing   Problem: Safety: Goal: Ability to remain free from injury will improve Outcome: Progressing   Problem: Skin Integrity: Goal: Risk for impaired skin integrity will decrease Outcome: Progressing   Problem: Education: Goal: Knowledge of Pancreatitis treatment and prevention will improve Outcome: Progressing   Problem: Health Behavior/Discharge Planning: Goal: Ability to formulate a plan to maintain an alcohol-free life will improve Outcome: Progressing   Problem: Nutritional: Goal: Ability to achieve adequate nutritional intake will improve Outcome: Progressing    Problem: Clinical Measurements: Goal: Complications related to the disease process, condition or treatment will be avoided or minimized Outcome: Progressing

## 2023-11-26 NOTE — Progress Notes (Signed)
   11/26/23 0843  Provider Notification  Provider response See new orders  Date of Provider Response 11/26/23  Time of Provider Response [phone]

## 2023-11-26 NOTE — Progress Notes (Addendum)
1905 During report patient asking for pain medication and previous shift RN educated patient he just had his pain medication. Will continue to monitor. Pt just received at 1846  Ativan 1mg  IV for CIWA 6.   1930 VS taken CIWA 3. Pt asking for pain medication stated "I am supposed to get my pain medication every 2 hrs." Educated pt that it is not ordered like that and per CIWA he is not due per CIWA protocol. Pt stated "So we're playing that game." Educated pt "It is not a game sir it is per our protocols". Will continue to monitor. Pt NADN Pt resting in bed VS stable no stress noted.   2020 Gave pt Dilaudid 0.5 mg IV for pain in ab 9/10. Pt said "They're not listening to me. I need Dilaudid 1 mg. They keep decreasing it and I can't eat". Educated pt Dr. Did address the pain medication today and it is up to his discretion. Pt addiment to increase Dilaudid to 1 mg. Educated pt that is not up to my discretion, that is a Dr order. Will continue to monitor. NADN

## 2023-11-27 DIAGNOSIS — K8500 Idiopathic acute pancreatitis without necrosis or infection: Secondary | ICD-10-CM | POA: Diagnosis not present

## 2023-11-27 LAB — CBC
HCT: 31.2 % — ABNORMAL LOW (ref 39.0–52.0)
Hemoglobin: 10.3 g/dL — ABNORMAL LOW (ref 13.0–17.0)
MCH: 30.6 pg (ref 26.0–34.0)
MCHC: 33 g/dL (ref 30.0–36.0)
MCV: 92.6 fL (ref 80.0–100.0)
Platelets: 143 10*3/uL — ABNORMAL LOW (ref 150–400)
RBC: 3.37 MIL/uL — ABNORMAL LOW (ref 4.22–5.81)
RDW: 15.3 % (ref 11.5–15.5)
WBC: 2.5 10*3/uL — ABNORMAL LOW (ref 4.0–10.5)
nRBC: 0 % (ref 0.0–0.2)

## 2023-11-27 LAB — LIPASE, BLOOD: Lipase: 68 U/L — ABNORMAL HIGH (ref 11–51)

## 2023-11-27 LAB — MAGNESIUM: Magnesium: 1.8 mg/dL (ref 1.7–2.4)

## 2023-11-27 MED ORDER — OXYCODONE HCL 5 MG PO TABS
5.0000 mg | ORAL_TABLET | ORAL | Status: DC | PRN
  Administered 2023-11-27: 5 mg via ORAL
  Administered 2023-11-27 – 2023-12-01 (×11): 10 mg via ORAL
  Filled 2023-11-27 (×9): qty 2
  Filled 2023-11-27: qty 1
  Filled 2023-11-27: qty 2
  Filled 2023-11-27: qty 1
  Filled 2023-11-27 (×2): qty 2

## 2023-11-27 MED ORDER — HYDROMORPHONE HCL 1 MG/ML IJ SOLN
0.5000 mg | INTRAMUSCULAR | Status: DC | PRN
  Administered 2023-11-27 – 2023-12-01 (×15): 0.5 mg via INTRAVENOUS
  Filled 2023-11-27 (×16): qty 0.5

## 2023-11-27 NOTE — Plan of Care (Signed)

## 2023-11-27 NOTE — Plan of Care (Signed)
  Problem: Nutrition: Goal: Adequate nutrition will be maintained Outcome: Progressing   

## 2023-11-27 NOTE — Progress Notes (Signed)
2000 Pt called C/O pain 10/10 offered Oxy d/t to soon for dilaudid. Pt refused. Educated pt then he will have to wait. Pt agitated and c/o Dilaudid was lowered. Re-educated pt that the Dilaudid was lowered the day prior and he has been receiving that for the past day and we have discussed that the MD is aware and I can not change the order. Pt c/o nausea as well.  2015 Gave pt Oxy 10mg  PO and Zofran 4mg  PO for nausea and pain 10/10 2030 Pt called out for pain medication and re-educated pt he just received Oxy 10mg  15 minutes ago. NADN Needs met. Will continue to monitor  0215 Pt called out C/O pain 10/10. Pt resting in bed with blanket over pt head. Lethargic and slow speech. Offered pt Oxy 10 mg PO since to soon for Dilaudid. Pt refused oxy "I want Dilaudid" Educated pt it was to soon for dilaudid. Pt stated "I'll wait to get the IV dilaudid. What's the use of having an IV?" Educated pt we can stagger Oxy and Dilaudid to help control the pain. Pt still refused. "I don't want that weak shit"  Will continue to monitor

## 2023-11-27 NOTE — Progress Notes (Signed)
PROGRESS NOTE    LANCELOT LAPEYROUSE  WUJ:[id number] DOB: 1986-07-01 DOA: 11/24/2023 PCP: Patient, No Pcp Per    Brief Narrative:    38 y.o. male with medical history significant of alcohol abuse, substance abuse, noncompliant HIV comes to the hospital with complaints of abdominal pain.  Patient was admitted to the hospital from 1/9 - 1/14 for alcohol induced acute pancreatitis and discharged.  Upon admission patient was started on fluid, diet was slowly advanced.  Case reviewed with GI who did not think patient really had necrosis therefore antibiotics were discontinued.  Assessment & Plan:  Principal Problem:   Acute pancreatitis Active Problems:   Polysubstance abuse (HCC)   Homelessness   Alcohol induced acute pancreatitis     Acute alcoholic pancreatitis Alcohol abuse - Currently on Librium taper and alcohol withdrawal protocol.  Continue multivitamin, folic acid and thiamine - Advance diet as tolerated, IV fluids, pain control.  Upon further review with [facility] (Dr Ewing Schlein), no evidence of necrosis at this time therefore we will discontinue Abx   HIV  -Has not been taking his Biktarvy. Follows at [facility] Noncompliant.    History of depression Peripheral neuropathy/seizure - Denies taking Lexapro, gabapentin, Keppra.         DVT prophylaxis: Lovenox Code Status: Full code code Family Communication:  Cont hosp stay for another 24-48hrs.    Subjective: Doing better no complaints.   Examination:  General exam: Appears calm and comfortable, cachectic frail with bilateral temporal wasting.  Appears older than stated age Respiratory system: Clear to auscultation. Respiratory effort normal. Cardiovascular system: S1 & S2 heard, RRR. No JVD, murmurs, rubs, gallops or clicks. No pedal edema. Gastrointestinal system: Abdomen is nondistended, soft and nontender. No organomegaly or masses felt. Normal bowel sounds heard. Central nervous system: Alert and oriented. No focal  neurological deficits. Extremities: Symmetric 5 x 5 power. Skin: No rashes, lesions or ulcers Psychiatry: Judgement and insight appear normal. Mood & affect appropriate.                Diet Orders (From admission, onward)     Start     Ordered   11/26/23 1057  Diet full liquid Room service appropriate? Yes; Fluid consistency: Thin  Diet effective now       Question Answer Comment  Room service appropriate? Yes   Fluid consistency: Thin      11/26/23 1056            Objective: Vitals:   11/27/23 0500 11/27/23 0610 11/27/23 0830 11/27/23 0933  BP:  93/61 96/70 96/70   Pulse:  (!) 55 60 (!) 59  Resp:  20    Temp:  98 F (36.7 C)    TempSrc:  Oral    SpO2:  97%    Weight: 63.9 kg     Height:        Intake/Output Summary (Last 24 hours) at 11/27/2023 1151 Last data filed at 11/27/2023 0900 Gross per 24 hour  Intake 1853.22 ml  Output 2750 ml  Net -896.78 ml   Filed Weights   11/26/23 1411 11/27/23 0500  Weight: 69.2 kg 63.9 kg    Scheduled Meds:  chlordiazePOXIDE  25 mg Oral Q1500   enoxaparin (LOVENOX) injection  40 mg Subcutaneous Q24H   folic acid  1 mg Oral Daily   lipase/protease/amylase  36,000 Units Oral TID AC   multivitamin with minerals  1 tablet Oral Daily   pantoprazole  40 mg Oral BID AC  thiamine  100 mg Oral Daily   Or   thiamine  100 mg Intravenous Daily   Continuous Infusions:  Nutritional status     Body mass index is 19.11 kg/m.  Data Reviewed:   CBC: Recent Labs  Lab 11/22/23 0556 11/23/23 0839 11/24/23 1954 11/26/23 1219 11/27/23 0608  WBC 3.6* 4.1 8.3 2.9* 2.5*  HGB 11.0* 12.8* 13.6 10.3* 10.3*  HCT 33.2* 37.7* 40.8 32.0* 31.2*  MCV 90.7 89.5 91.3 93.8 92.6  PLT 269 254 358 166 143*   Basic Metabolic Panel: Recent Labs  Lab 11/21/23 0652 11/22/23 0556 11/23/23 0839 11/24/23 1954 11/26/23 0842 11/27/23 0608  NA 136 136 134* 135 134*  --   K 3.5 4.3 4.5 3.6 4.5  --   CL 102 101 100 97* 103  --   CO2  26 28 26 25 24   --   GLUCOSE 91 87 93 90 75  --   BUN <5* <5* 9 18 9   --   CREATININE 0.57* 0.62 0.80 0.70 0.34*  --   CALCIUM 8.9 9.9 9.5 9.0 8.6*  --   MG  --   --   --   --  2.1 1.8  PHOS  --   --   --   --  2.5  --    GFR: Estimated Creatinine Clearance: 114.3 mL/min (A) (by C-G formula based on SCr of 0.34 mg/dL (L)). Liver Function Tests: Recent Labs  Lab 11/21/23 0652 11/23/23 0839 11/24/23 1954 11/26/23 0842  AST 17 17 18 15   ALT 13 12 13 11   ALKPHOS 46 47 61 43  BILITOT 0.5 0.6 0.4 0.9  PROT 6.0* 6.8 8.5* 5.9*  ALBUMIN 3.0* 3.4* 4.2 3.2*   Recent Labs  Lab 11/21/23 0652 11/22/23 0556 11/23/23 0839 11/24/23 1954 11/27/23 0608  LIPASE 302* 328* 304* 399* 68*   No results for input(s): "AMMONIA" in the last 168 hours. Coagulation Profile: No results for input(s): "INR", "PROTIME" in the last 168 hours. Cardiac Enzymes: No results for input(s): "CKTOTAL", "CKMB", "CKMBINDEX", "TROPONINI" in the last 168 hours. BNP (last 3 results) No results for input(s): "PROBNP" in the last 8760 hours. HbA1C: No results for input(s): "HGBA1C" in the last 72 hours. CBG: No results for input(s): "GLUCAP" in the last 168 hours. Lipid Profile: No results for input(s): "CHOL", "HDL", "LDLCALC", "TRIG", "CHOLHDL", "LDLDIRECT" in the last 72 hours. Thyroid Function Tests: No results for input(s): "TSH", "T4TOTAL", "FREET4", "T3FREE", "THYROIDAB" in the last 72 hours. Anemia Panel: No results for input(s): "VITAMINB12", "FOLATE", "FERRITIN", "TIBC", "IRON", "RETICCTPCT" in the last 72 hours. Sepsis Labs: No results for input(s): "PROCALCITON", "LATICACIDVEN" in the last 168 hours.  No results found for this or any previous visit (from the past 240 hours).       Radiology Studies: No results found.         LOS: 1 day   Time spent= 35 mins    [name], MD [facility]  If 7PM-7AM, please contact night-coverage  11/27/2023, 11:51 AM

## 2023-11-27 NOTE — Plan of Care (Signed)
  Problem: Education: Goal: Knowledge of General Education information will improve Description: Including pain rating scale, medication(s)/side effects and non-pharmacologic comfort measures Outcome: Progressing   Problem: Health Behavior/Discharge Planning: Goal: Ability to manage health-related needs will improve Outcome: Progressing   Problem: Clinical Measurements: Goal: Ability to maintain clinical measurements within normal limits will improve Outcome: Progressing Goal: Will remain free from infection Outcome: Progressing Goal: Diagnostic test results will improve Outcome: Progressing Goal: Respiratory complications will improve Outcome: Progressing Goal: Cardiovascular complication will be avoided Outcome: Progressing   Problem: Activity: Goal: Risk for activity intolerance will decrease Outcome: Progressing   Problem: Nutrition: Goal: Adequate nutrition will be maintained Outcome: Progressing   Problem: Coping: Goal: Level of anxiety will decrease Outcome: Progressing   Problem: Elimination: Goal: Will not experience complications related to bowel motility Outcome: Progressing Goal: Will not experience complications related to urinary retention Outcome: Progressing   Problem: Pain Managment: Goal: General experience of comfort will improve and/or be controlled Outcome: Progressing

## 2023-11-28 DIAGNOSIS — K8500 Idiopathic acute pancreatitis without necrosis or infection: Secondary | ICD-10-CM | POA: Diagnosis not present

## 2023-11-28 LAB — CBC
HCT: 32.4 % — ABNORMAL LOW (ref 39.0–52.0)
Hemoglobin: 10.6 g/dL — ABNORMAL LOW (ref 13.0–17.0)
MCH: 30.4 pg (ref 26.0–34.0)
MCHC: 32.7 g/dL (ref 30.0–36.0)
MCV: 92.8 fL (ref 80.0–100.0)
Platelets: 146 10*3/uL — ABNORMAL LOW (ref 150–400)
RBC: 3.49 MIL/uL — ABNORMAL LOW (ref 4.22–5.81)
RDW: 15.2 % (ref 11.5–15.5)
WBC: 3 10*3/uL — ABNORMAL LOW (ref 4.0–10.5)
nRBC: 0 % (ref 0.0–0.2)

## 2023-11-28 LAB — MAGNESIUM: Magnesium: 1.8 mg/dL (ref 1.7–2.4)

## 2023-11-28 LAB — LIPASE, BLOOD: Lipase: 99 U/L — ABNORMAL HIGH (ref 11–51)

## 2023-11-28 NOTE — Plan of Care (Signed)

## 2023-11-28 NOTE — Progress Notes (Signed)
PROGRESS NOTE    Gary [name]  RUE:[id number] DOB: 12/23/1985 DOA: 11/24/2023 PCP: Patient, No Pcp Per    Brief Narrative:    38 y.o. male with medical history significant of alcohol abuse, substance abuse, noncompliant HIV comes to the hospital with complaints of abdominal pain.  Patient was admitted to the hospital from 1/9 - 1/14 for alcohol induced acute pancreatitis and discharged.  Upon admission patient was started on fluid, diet was slowly advanced.  Case reviewed with GI who did not think patient really had necrosis therefore antibiotics were discontinued.  Assessment & Plan:  Principal Problem:   Acute pancreatitis Active Problems:   Polysubstance abuse (HCC)   Homelessness   Alcohol induced acute pancreatitis     Acute alcoholic pancreatitis Alcohol abuse - Currently on Librium taper and alcohol withdrawal protocol.  Continue multivitamin, folic acid and thiamine - Advance diet as tolerated, IV fluids, pain control.  Upon further review with [facility] (Dr Ewing Schlein), no evidence of necrosis at this time therefore we will discontinue Abx   HIV  -Has not been taking his Biktarvy. Follows at [facility]. Noncompliant.    History of depression Peripheral neuropathy/seizure - Denies taking Lexapro, gabapentin, Keppra.         DVT prophylaxis: Lovenox Code Status: Full code code Family Communication:  Cont hosp stay for another 24hrs.    Subjective: States he is feeling little better and would like to try soft diet.  Examination:  General exam: Appears calm and comfortable, cachectic frail with bilateral temporal wasting.  Appears older than stated age Respiratory system: Clear to auscultation. Respiratory effort normal. Cardiovascular system: S1 & S2 heard, RRR. No JVD, murmurs, rubs, gallops or clicks. No pedal edema. Gastrointestinal system: Abdomen is nondistended, soft and nontender. No organomegaly or masses felt. Normal bowel sounds heard. Central nervous  system: Alert and oriented. No focal neurological deficits. Extremities: Symmetric 5 x 5 power. Skin: No rashes, lesions or ulcers Psychiatry: Judgement and insight appear normal. Mood & affect appropriate.                Diet Orders (From admission, onward)     Start     Ordered   11/28/23 1107  DIET SOFT Room service appropriate? Yes; Fluid consistency: Thin  Diet effective now       Question Answer Comment  Room service appropriate? Yes   Fluid consistency: Thin      11/28/23 1106            Objective: Vitals:   11/27/23 1800 11/28/23 0038 11/28/23 0539 11/28/23 0938  BP: 98/66 101/67 101/66 98/65  Pulse: (!) 58 (!) 50 (!) 56 60  Resp:  16 15   Temp:  98.2 F (36.8 C) 98 F (36.7 C)   TempSrc:  Oral Oral   SpO2:  96% 99%   Weight:      Height:        Intake/Output Summary (Last 24 hours) at 11/28/2023 1135 Last data filed at 11/28/2023 1478 Gross per 24 hour  Intake --  Output 3640 ml  Net -3640 ml   Filed Weights   11/26/23 1411 11/27/23 0500  Weight: 69.2 kg 63.9 kg    Scheduled Meds:  enoxaparin (LOVENOX) injection  40 mg Subcutaneous Q24H   folic acid  1 mg Oral Daily   lipase/protease/amylase  36,000 Units Oral TID AC   multivitamin with minerals  1 tablet Oral Daily   pantoprazole  40 mg Oral BID AC  thiamine  100 mg Oral Daily   Or   thiamine  100 mg Intravenous Daily   Continuous Infusions:  Nutritional status     Body mass index is 19.11 kg/m.  Data Reviewed:   CBC: Recent Labs  Lab 11/23/23 0839 11/24/23 1954 11/26/23 1219 11/27/23 0608 11/28/23 0535  WBC 4.1 8.3 2.9* 2.5* 3.0*  HGB 12.8* 13.6 10.3* 10.3* 10.6*  HCT 37.7* 40.8 32.0* 31.2* 32.4*  MCV 89.5 91.3 93.8 92.6 92.8  PLT 254 358 166 143* 146*   Basic Metabolic Panel: Recent Labs  Lab 11/22/23 0556 11/23/23 0839 11/24/23 1954 11/26/23 0842 11/27/23 0608 11/28/23 0535  NA 136 134* 135 134*  --   --   K 4.3 4.5 3.6 4.5  --   --   CL 101 100 97*  103  --   --   CO2 28 26 25 24   --   --   GLUCOSE 87 93 90 75  --   --   BUN <5* 9 18 9   --   --   CREATININE 0.62 0.80 0.70 0.34*  --   --   CALCIUM 9.9 9.5 9.0 8.6*  --   --   MG  --   --   --  2.1 1.8 1.8  PHOS  --   --   --  2.5  --   --    GFR: Estimated Creatinine Clearance: 114.3 mL/min (A) (by C-G formula based on SCr of 0.34 mg/dL (L)). Liver Function Tests: Recent Labs  Lab 11/23/23 0839 11/24/23 1954 11/26/23 0842  AST 17 18 15   ALT 12 13 11   ALKPHOS 47 61 43  BILITOT 0.6 0.4 0.9  PROT 6.8 8.5* 5.9*  ALBUMIN 3.4* 4.2 3.2*   Recent Labs  Lab 11/22/23 0556 11/23/23 0839 11/24/23 1954 11/27/23 0608 11/28/23 0535  LIPASE 328* 304* 399* 68* 99*   No results for input(s): "AMMONIA" in the last 168 hours. Coagulation Profile: No results for input(s): "INR", "PROTIME" in the last 168 hours. Cardiac Enzymes: No results for input(s): "CKTOTAL", "CKMB", "CKMBINDEX", "TROPONINI" in the last 168 hours. BNP (last 3 results) No results for input(s): "PROBNP" in the last 8760 hours. HbA1C: No results for input(s): "HGBA1C" in the last 72 hours. CBG: No results for input(s): "GLUCAP" in the last 168 hours. Lipid Profile: No results for input(s): "CHOL", "HDL", "LDLCALC", "TRIG", "CHOLHDL", "LDLDIRECT" in the last 72 hours. Thyroid Function Tests: No results for input(s): "TSH", "T4TOTAL", "FREET4", "T3FREE", "THYROIDAB" in the last 72 hours. Anemia Panel: No results for input(s): "VITAMINB12", "FOLATE", "FERRITIN", "TIBC", "IRON", "RETICCTPCT" in the last 72 hours. Sepsis Labs: No results for input(s): "PROCALCITON", "LATICACIDVEN" in the last 168 hours.  No results found for this or any previous visit (from the past 240 hours).       Radiology Studies: No results found.         LOS: 2 days   Time spent= 35 mins    [name], MD [facility]  If 7PM-7AM, please contact night-coverage  11/28/2023, 11:35 AM

## 2023-11-28 NOTE — Plan of Care (Signed)
  Problem: Coping: Goal: Level of anxiety will decrease Outcome: Not Progressing   

## 2023-11-29 DIAGNOSIS — K8500 Idiopathic acute pancreatitis without necrosis or infection: Secondary | ICD-10-CM | POA: Diagnosis not present

## 2023-11-29 LAB — CBC
HCT: 32.8 % — ABNORMAL LOW (ref 39.0–52.0)
Hemoglobin: 10.8 g/dL — ABNORMAL LOW (ref 13.0–17.0)
MCH: 30.3 pg (ref 26.0–34.0)
MCHC: 32.9 g/dL (ref 30.0–36.0)
MCV: 91.9 fL (ref 80.0–100.0)
Platelets: 168 10*3/uL (ref 150–400)
RBC: 3.57 MIL/uL — ABNORMAL LOW (ref 4.22–5.81)
RDW: 15.1 % (ref 11.5–15.5)
WBC: 3.9 10*3/uL — ABNORMAL LOW (ref 4.0–10.5)
nRBC: 0 % (ref 0.0–0.2)

## 2023-11-29 LAB — LIPASE, BLOOD: Lipase: 118 U/L — ABNORMAL HIGH (ref 11–51)

## 2023-11-29 LAB — MAGNESIUM: Magnesium: 1.8 mg/dL (ref 1.7–2.4)

## 2023-11-29 MED ORDER — SODIUM CHLORIDE 0.9 % IV BOLUS
500.0000 mL | Freq: Once | INTRAVENOUS | Status: AC
  Administered 2023-11-29: 500 mL via INTRAVENOUS

## 2023-11-29 MED ORDER — DIPHENHYDRAMINE HCL 25 MG PO CAPS
25.0000 mg | ORAL_CAPSULE | Freq: Three times a day (TID) | ORAL | Status: DC | PRN
  Administered 2023-11-29 – 2023-11-30 (×2): 25 mg via ORAL
  Filled 2023-11-29 (×2): qty 1

## 2023-11-29 NOTE — Progress Notes (Signed)
PROGRESS NOTE    Gary [name]  IRW:[id number] DOB: [birth year] DOA: 11/24/2023 PCP: Patient, No Pcp Per    Brief Narrative:    38 y.o. male with medical history significant of alcohol abuse, substance abuse, noncompliant HIV comes to the hospital with complaints of abdominal pain.  Patient was admitted to the hospital from 1/9 - 1/14 for alcohol induced acute pancreatitis and discharged.  Upon admission patient was started on fluid, diet was slowly advanced.  Case reviewed with GI who did not think patient really had necrosis therefore antibiotics were discontinued.  Assessment & Plan:  Principal Problem:   Acute pancreatitis Active Problems:   Polysubstance abuse (HCC)   Homelessness   Alcohol induced acute pancreatitis     Acute alcoholic pancreatitis Alcohol abuse - Completed Librium taper, monitor for any alcohol withdrawal but I think he is outside of the window now.  Continue multivitamin, folic acid and thiamine. - Still having quite a bit of abdominal pain, will change his diet back to full liquid again.   HIV  -Has not been taking his Biktarvy. Follows at [facility]. Noncompliant.    History of depression Peripheral neuropathy/seizure - Denies taking Lexapro, gabapentin, Keppra.    DVT prophylaxis: Lovenox Code Status: Full code code Family Communication:  Cont hosp stay for another 24hrs.    Subjective: Still having abd pain.   Examination:  General exam: Appears calm and comfortable, cachectic frail with bilateral temporal wasting.  Appears older than stated age Respiratory system: Clear to auscultation. Respiratory effort normal. Cardiovascular system: S1 & S2 heard, RRR. No JVD, murmurs, rubs, gallops or clicks. No pedal edema. Gastrointestinal system: Abdomen is nondistended, soft and nontender. No organomegaly or masses felt. Normal bowel sounds heard. Central nervous system: Alert and oriented. No focal neurological deficits. Extremities: Symmetric  5 x 5 power. Skin: No rashes, lesions or ulcers Psychiatry: Judgement and insight appear normal. Mood & affect appropriate.                Diet Orders (From admission, onward)     Start     Ordered   11/29/23 1117  Diet full liquid Room service appropriate? Yes; Fluid consistency: Thin  Diet effective now       Question Answer Comment  Room service appropriate? Yes   Fluid consistency: Thin      11/29/23 1118            Objective: Vitals:   11/29/23 0016 11/29/23 0500 11/29/23 0540 11/29/23 0841  BP: 101/69  (!) 93/59 (!) 92/58  Pulse: 60  (!) 57 63  Resp: 18  20 18   Temp: 98.8 F (37.1 C)  98.1 F (36.7 C)   TempSrc: Oral     SpO2: 97%  98% 99%  Weight:  64.1 kg    Height:        Intake/Output Summary (Last 24 hours) at 11/29/2023 1211 Last data filed at 11/29/2023 1048 Gross per 24 hour  Intake 508.32 ml  Output 1850 ml  Net -1341.68 ml   Filed Weights   11/26/23 1411 11/27/23 0500 11/29/23 0500  Weight: 69.2 kg 63.9 kg 64.1 kg    Scheduled Meds:  enoxaparin (LOVENOX) injection  40 mg Subcutaneous Q24H   folic acid  1 mg Oral Daily   lipase/protease/amylase  36,000 Units Oral TID AC   multivitamin with minerals  1 tablet Oral Daily   pantoprazole  40 mg Oral BID AC   thiamine  100 mg Oral Daily  Or   thiamine  100 mg Intravenous Daily   Continuous Infusions:  Nutritional status     Body mass index is 19.17 kg/m.  Data Reviewed:   CBC: Recent Labs  Lab 11/24/23 1954 11/26/23 1219 11/27/23 0608 11/28/23 0535 11/29/23 0421  WBC 8.3 2.9* 2.5* 3.0* 3.9*  HGB 13.6 10.3* 10.3* 10.6* 10.8*  HCT 40.8 32.0* 31.2* 32.4* 32.8*  MCV 91.3 93.8 92.6 92.8 91.9  PLT 358 166 143* 146* 168   Basic Metabolic Panel: Recent Labs  Lab 11/23/23 0839 11/24/23 1954 11/26/23 0842 11/27/23 0608 11/28/23 0535 11/29/23 0421  NA 134* 135 134*  --   --   --   K 4.5 3.6 4.5  --   --   --   CL 100 97* 103  --   --   --   CO2 26 25 24   --   --    --   GLUCOSE 93 90 75  --   --   --   BUN 9 18 9   --   --   --   CREATININE 0.80 0.70 0.34*  --   --   --   CALCIUM 9.5 9.0 8.6*  --   --   --   MG  --   --  2.1 1.8 1.8 1.8  PHOS  --   --  2.5  --   --   --    GFR: Estimated Creatinine Clearance: 114.6 mL/min (A) (by C-G formula based on SCr of 0.34 mg/dL (L)). Liver Function Tests: Recent Labs  Lab 11/23/23 0839 11/24/23 1954 11/26/23 0842  AST 17 18 15   ALT 12 13 11   ALKPHOS 47 61 43  BILITOT 0.6 0.4 0.9  PROT 6.8 8.5* 5.9*  ALBUMIN 3.4* 4.2 3.2*   Recent Labs  Lab 11/23/23 0839 11/24/23 1954 11/27/23 0608 11/28/23 0535 11/29/23 0421  LIPASE 304* 399* 68* 99* 118*   No results for input(s): "AMMONIA" in the last 168 hours. Coagulation Profile: No results for input(s): "INR", "PROTIME" in the last 168 hours. Cardiac Enzymes: No results for input(s): "CKTOTAL", "CKMB", "CKMBINDEX", "TROPONINI" in the last 168 hours. BNP (last 3 results) No results for input(s): "PROBNP" in the last 8760 hours. HbA1C: No results for input(s): "HGBA1C" in the last 72 hours. CBG: No results for input(s): "GLUCAP" in the last 168 hours. Lipid Profile: No results for input(s): "CHOL", "HDL", "LDLCALC", "TRIG", "CHOLHDL", "LDLDIRECT" in the last 72 hours. Thyroid Function Tests: No results for input(s): "TSH", "T4TOTAL", "FREET4", "T3FREE", "THYROIDAB" in the last 72 hours. Anemia Panel: No results for input(s): "VITAMINB12", "FOLATE", "FERRITIN", "TIBC", "IRON", "RETICCTPCT" in the last 72 hours. Sepsis Labs: No results for input(s): "PROCALCITON", "LATICACIDVEN" in the last 168 hours.  No results found for this or any previous visit (from the past 240 hours).       Radiology Studies: No results found.         LOS: 3 days   Time spent= 35 mins    [name], MD [facility]  If 7PM-7AM, please contact night-coverage  11/29/2023, 12:11 PM

## 2023-11-29 NOTE — TOC Initial Note (Signed)
Transition of Care [facility]) - Initial/Assessment Note    Patient Details  Name: Gary [name] MRN: [id number] Date of Birth: Apr 02, 1986  Transition of [facility]) CM/SW Contact:    Elliot Gault, LCSW Phone Number: 11/29/2023, 10:28 AM  Clinical Narrative:                  Pt was discharged from [facility] on 1/14 and then presented to [facility] ED on 1/15. Pt high risk for readmission.  Pt does not have secure housing and has stated that he stays on the street. Pt does not have a PCP and he does not take medications as prescribed. Pt does have a managed Medicaid plan and has coverage for care and for medications.  Substance abuse treatment resource list and shelter resource list added to pt's AVS. Pt utilizes public transportation.  TOC will follow and assist if any other needs arise. Expected Discharge Plan: Homeless Shelter Barriers to Discharge: Continued Medical Work up   Patient Goals and CMS Choice            Expected Discharge Plan and Services In-house Referral: Clinical Social Work     Living arrangements for the past 2 months: Homeless                                      Prior Living Arrangements/Services Living arrangements for the past 2 months: Homeless Lives with:: Self Patient language and need for interpreter reviewed:: Yes Do you feel safe going back to the place where you live?: Yes      Need for Family Participation in Patient Care: No (Comment)     Criminal Activity/Legal Involvement Pertinent to Current Situation/Hospitalization: No - Comment as needed  Activities of Daily Living   ADL Screening (condition at time of admission) Independently performs ADLs?: Yes (appropriate for developmental age) Is the patient deaf or have difficulty hearing?: No Does the patient have difficulty seeing, even when wearing glasses/contacts?: No Does the patient have difficulty concentrating, remembering, or making decisions?: No  Permission Sought/Granted                   Emotional Assessment Appearance:: Appears stated age   Affect (typically observed): Appropriate Orientation: : Oriented to Self, Oriented to Place, Oriented to  Time, Oriented to Situation Alcohol / Substance Use: Illicit Drugs, Alcohol Use Psych Involvement: No (comment)  Admission diagnosis:  Acute pancreatitis [K85.90] Alcohol-induced acute pancreatitis with uninfected necrosis [K85.21] Alcohol withdrawal (HCC) [F10.939] Patient Active Problem List   Diagnosis Date Noted   Alcohol withdrawal (HCC) 11/26/2023   Seizure (HCC) 11/19/2023   Gastritis and duodenitis 11/19/2023   Pneumonia 11/06/2023   Nausea and vomiting 11/01/2023   Acute pancreatitis 10/31/2023   Chronic pancreatitis (HCC) 10/16/2023   Acute on chronic pancreatitis (HCC) 10/16/2023   Recurrent acute pancreatitis 10/09/2023   Macrocytic anemia 09/26/2023   Alcoholic fatty liver 09/25/2023   Protein-calorie malnutrition, severe 12/25/2022   Hypomagnesemia 12/24/2022   Alcohol withdrawal syndrome without complication (HCC) 12/23/2022   Seizure disorder (HCC) 12/17/2022   Pancytopenia (HCC) 12/14/2022   Alcohol-induced pancreatitis 12/14/2022   Delirium tremens (HCC) 10/19/2022   Alcohol abuse with alcohol-induced mood disorder (HCC) 10/16/2022   Alcohol dependence with withdrawal (HCC) 10/16/2022   Abnormal LFTs 10/16/2022   Alcohol induced acute pancreatitis 09/11/2022   AKI (acute kidney injury) (HCC) 05/28/2022   Homelessness 05/28/2022   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  Metabolic acidosis with increased anion gap and accumulation of organic acids 04/16/2022   Hypokalemia 01/22/2022   Alcohol-induced insomnia (HCC)    MDD (major depressive disorder), recurrent episode, 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/26/2021   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   Alcohol abuse    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   Malingering 05/15/2020   Malnutrition of moderate degree 05/07/2020   Alcoholic ketoacidosis  05/05/2020   Thrombocytopenia (HCC) 07/25/2019   Traumatic subdural hematoma (HCC) 07/02/2019   Alcoholic intoxication with complication (HCC) 07/02/2019   Hypocalcemia 07/02/2019   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   Alcohol use disorder, severe, dependence (HCC) 04/16/2019   Major depressive disorder, 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 [place] [address] Phone: [phone] Fax: [phone]  [facility]) [phone] - [place], [place] - [address] AT [facility] [address] [place] [address] Phone: [phone] Fax: [phone]  [id number] [place], [place] - [address] AT [facility] OF [address] [place] [address] Phone: [phone] Fax: [phone]     Social Drivers of Health (SDOH) Social History: SDOH Screenings   Food Insecurity: Food Insecurity Present (11/25/2023)  Housing: High Risk (11/25/2023)  Transportation Needs: Unmet Transportation Needs (11/25/2023)  Utilities: Patient Unable To Answer (11/25/2023)  Recent Concern: Utilities - At Risk (11/11/2023)  Alcohol Screen: High Risk (07/25/2021)  Depression (PHQ2-9): Medium Risk (02/12/2021)  Financial Resource Strain: Patient Declined (10/11/2023)  Social Connections: Unknown (06/24/2022)   Received from [facility]  Stress: No Stress Concern Present (06/30/2022)   Received from [facility]  Recent Concern: Stress - Stress Concern Present (06/25/2022)   Received from [facility]  Tobacco Use: High Risk (11/24/2023)   SDOH Interventions:     Readmission Risk Interventions    11/29/2023    10:27 AM 11/22/2023   10:36 AM 11/12/2023   12:41 PM  Readmission Risk Prevention Plan  Transportation Screening Complete Complete Complete  Medication Review Oceanographer) Complete Complete Complete  PCP or Specialist appointment within 3-5 days of discharge  Complete   HRI or Home Care Consult Complete Complete Complete  SW Recovery Care/Counseling Consult Complete Complete Complete  Palliative Care Screening Not Applicable Not Applicable Not Applicable  Skilled Nursing Facility Not Applicable Not Applicable Not Applicable

## 2023-11-30 DIAGNOSIS — K8500 Idiopathic acute pancreatitis without necrosis or infection: Secondary | ICD-10-CM | POA: Diagnosis not present

## 2023-11-30 LAB — COMPREHENSIVE METABOLIC PANEL
ALT: 14 U/L (ref 0–44)
AST: 15 U/L (ref 15–41)
Albumin: 3.3 g/dL — ABNORMAL LOW (ref 3.5–5.0)
Alkaline Phosphatase: 37 U/L — ABNORMAL LOW (ref 38–126)
Anion gap: 6 (ref 5–15)
BUN: 8 mg/dL (ref 6–20)
CO2: 24 mmol/L (ref 22–32)
Calcium: 8.8 mg/dL — ABNORMAL LOW (ref 8.9–10.3)
Chloride: 104 mmol/L (ref 98–111)
Creatinine, Ser: 0.42 mg/dL — ABNORMAL LOW (ref 0.61–1.24)
GFR, Estimated: >60 mL/min (ref >60)
Glucose, Bld: 96 mg/dL (ref 70–99)
Potassium: 3.6 mmol/L (ref 3.5–5.1)
Sodium: 134 mmol/L — ABNORMAL LOW (ref 135–145)
Total Bilirubin: 0.5 mg/dL (ref 0.0–1.2)
Total Protein: 6.3 g/dL — ABNORMAL LOW (ref 6.5–8.1)

## 2023-11-30 LAB — CBC
HCT: 33.2 % — ABNORMAL LOW (ref 39.0–52.0)
Hemoglobin: 11.1 g/dL — ABNORMAL LOW (ref 13.0–17.0)
MCH: 30.2 pg (ref 26.0–34.0)
MCHC: 33.4 g/dL (ref 30.0–36.0)
MCV: 90.2 fL (ref 80.0–100.0)
Platelets: 126 10*3/uL — ABNORMAL LOW (ref 150–400)
RBC: 3.68 MIL/uL — ABNORMAL LOW (ref 4.22–5.81)
RDW: 14.8 % (ref 11.5–15.5)
WBC: 3.5 10*3/uL — ABNORMAL LOW (ref 4.0–10.5)
nRBC: 0 % (ref 0.0–0.2)

## 2023-11-30 LAB — MAGNESIUM: Magnesium: 1.8 mg/dL (ref 1.7–2.4)

## 2023-11-30 MED ORDER — SODIUM CHLORIDE 0.9 % IV BOLUS
500.0000 mL | Freq: Once | INTRAVENOUS | Status: AC
  Administered 2023-11-30: 500 mL via INTRAVENOUS

## 2023-11-30 NOTE — Progress Notes (Signed)
PROGRESS NOTE    BRANTLY FREER  YWV:[id number] DOB: 08/20/1986 DOA: 11/24/2023 PCP: Patient, No Pcp Per    Brief Narrative:    38 y.o. male with medical history significant of alcohol abuse, substance abuse, noncompliant HIV comes to the hospital with complaints of abdominal pain.  Patient was admitted to the hospital from 1/9 - 1/14 for alcohol induced acute pancreatitis and discharged.  Upon admission patient was started on fluid, diet was slowly advanced.  Case reviewed with GI who did not think patient really had necrosis therefore antibiotics were discontinued.  Slow to improve.  I think he should be able to go home by tomorrow  Assessment & Plan:  Principal Problem:   Acute pancreatitis Active Problems:   Polysubstance abuse (HCC)   Homelessness   Alcohol induced acute pancreatitis     Acute alcoholic pancreatitis Alcohol abuse - Completed Librium taper, monitor for any alcohol withdrawal but I think he is outside of the window now.  Continue multivitamin, folic acid and thiamine. - Still having quite a bit of abdominal pain, will change his diet back to full liquid again.   HIV  -Has not been taking his Biktarvy. Follows at [facility]. Noncompliant.    History of depression Peripheral neuropathy/seizure - Denies taking Lexapro, gabapentin, Keppra.    DVT prophylaxis: Lovenox Code Status: Full code code Family Communication:  Cont hosp stay for another 24hrs.    Subjective: Still having abdominal pain but some improvement today  Examination:  General exam: Appears calm and comfortable, cachectic frail with bilateral temporal wasting.  Appears older than stated age Respiratory system: Clear to auscultation. Respiratory effort normal. Cardiovascular system: S1 & S2 heard, RRR. No JVD, murmurs, rubs, gallops or clicks. No pedal edema. Gastrointestinal system: Abdomen is nondistended, soft and nontender. No organomegaly or masses felt. Normal bowel sounds  heard. Central nervous system: Alert and oriented. No focal neurological deficits. Extremities: Symmetric 5 x 5 power. Skin: No rashes, lesions or ulcers Psychiatry: Judgement and insight appear normal. Mood & affect appropriate.                Diet Orders (From admission, onward)     Start     Ordered   11/30/23 1218  DIET SOFT Room service appropriate? Yes; Fluid consistency: Thin  Diet effective now       Question Answer Comment  Room service appropriate? Yes   Fluid consistency: Thin      11/30/23 1218            Objective: Vitals:   11/29/23 1303 11/29/23 1954 11/30/23 0500 11/30/23 0520  BP: 108/79 93/65  (!) 89/61  Pulse: (!) 56 63  60  Resp: 18 16  16   Temp: 98.1 F (36.7 C) 98.7 F (37.1 C)  98.3 F (36.8 C)  TempSrc: Oral Oral  Oral  SpO2: 100% 100%  95%  Weight:   62.2 kg   Height:        Intake/Output Summary (Last 24 hours) at 11/30/2023 1242 Last data filed at 11/30/2023 0300 Gross per 24 hour  Intake 120 ml  Output 1825 ml  Net -1705 ml   Filed Weights   11/27/23 0500 11/29/23 0500 11/30/23 0500  Weight: 63.9 kg 64.1 kg 62.2 kg    Scheduled Meds:  enoxaparin (LOVENOX) injection  40 mg Subcutaneous Q24H   folic acid  1 mg Oral Daily   lipase/protease/amylase  36,000 Units Oral TID AC   multivitamin with minerals  1 tablet  Oral Daily   pantoprazole  40 mg Oral BID AC   thiamine  100 mg Oral Daily   Or   thiamine  100 mg Intravenous Daily   Continuous Infusions:  Nutritional status     Body mass index is 18.6 kg/m.  Data Reviewed:   CBC: Recent Labs  Lab 11/26/23 1219 11/27/23 0608 11/28/23 0535 11/29/23 0421 11/30/23 0502  WBC 2.9* 2.5* 3.0* 3.9* 3.5*  HGB 10.3* 10.3* 10.6* 10.8* 11.1*  HCT 32.0* 31.2* 32.4* 32.8* 33.2*  MCV 93.8 92.6 92.8 91.9 90.2  PLT 166 143* 146* 168 126*   Basic Metabolic Panel: Recent Labs  Lab 11/24/23 1954 11/26/23 0842 11/27/23 0608 11/28/23 0535 11/29/23 0421 11/30/23 0502   NA 135 134*  --   --   --  134*  K 3.6 4.5  --   --   --  3.6  CL 97* 103  --   --   --  104  CO2 25 24  --   --   --  24  GLUCOSE 90 75  --   --   --  96  BUN 18 9  --   --   --  8  CREATININE 0.70 0.34*  --   --   --  0.42*  CALCIUM 9.0 8.6*  --   --   --  8.8*  MG  --  2.1 1.8 1.8 1.8 1.8  PHOS  --  2.5  --   --   --   --    GFR: Estimated Creatinine Clearance: 111.2 mL/min (A) (by C-G formula based on SCr of 0.42 mg/dL (L)). Liver Function Tests: Recent Labs  Lab 11/24/23 1954 11/26/23 0842 11/30/23 0502  AST 18 15 15   ALT 13 11 14   ALKPHOS 61 43 37*  BILITOT 0.4 0.9 0.5  PROT 8.5* 5.9* 6.3*  ALBUMIN 4.2 3.2* 3.3*   Recent Labs  Lab 11/24/23 1954 11/27/23 0608 11/28/23 0535 11/29/23 0421  LIPASE 399* 68* 99* 118*   No results for input(s): "AMMONIA" in the last 168 hours. Coagulation Profile: No results for input(s): "INR", "PROTIME" in the last 168 hours. Cardiac Enzymes: No results for input(s): "CKTOTAL", "CKMB", "CKMBINDEX", "TROPONINI" in the last 168 hours. BNP (last 3 results) No results for input(s): "PROBNP" in the last 8760 hours. HbA1C: No results for input(s): "HGBA1C" in the last 72 hours. CBG: No results for input(s): "GLUCAP" in the last 168 hours. Lipid Profile: No results for input(s): "CHOL", "HDL", "LDLCALC", "TRIG", "CHOLHDL", "LDLDIRECT" in the last 72 hours. Thyroid Function Tests: No results for input(s): "TSH", "T4TOTAL", "FREET4", "T3FREE", "THYROIDAB" in the last 72 hours. Anemia Panel: No results for input(s): "VITAMINB12", "FOLATE", "FERRITIN", "TIBC", "IRON", "RETICCTPCT" in the last 72 hours. Sepsis Labs: No results for input(s): "PROCALCITON", "LATICACIDVEN" in the last 168 hours.  No results found for this or any previous visit (from the past 240 hours).       Radiology Studies: No results found.         LOS: 4 days   Time spent= 35 mins    [name], MD [facility]  If 7PM-7AM, please  contact night-coverage  11/30/2023, 12:42 PM

## 2023-12-01 ENCOUNTER — Other Ambulatory Visit: Payer: Self-pay

## 2023-12-01 ENCOUNTER — Other Ambulatory Visit (HOSPITAL_COMMUNITY): Payer: Self-pay

## 2023-12-01 DIAGNOSIS — K8500 Idiopathic acute pancreatitis without necrosis or infection: Secondary | ICD-10-CM | POA: Diagnosis not present

## 2023-12-01 LAB — COMPREHENSIVE METABOLIC PANEL
ALT: 14 U/L (ref 0–44)
AST: 14 U/L — ABNORMAL LOW (ref 15–41)
Albumin: 3.3 g/dL — ABNORMAL LOW (ref 3.5–5.0)
Alkaline Phosphatase: 35 U/L — ABNORMAL LOW (ref 38–126)
Anion gap: 5 (ref 5–15)
BUN: 10 mg/dL (ref 6–20)
CO2: 27 mmol/L (ref 22–32)
Calcium: 8.9 mg/dL (ref 8.9–10.3)
Chloride: 101 mmol/L (ref 98–111)
Creatinine, Ser: 0.39 mg/dL — ABNORMAL LOW (ref 0.61–1.24)
GFR, Estimated: >60 mL/min (ref >60)
Glucose, Bld: 89 mg/dL (ref 70–99)
Potassium: 3.8 mmol/L (ref 3.5–5.1)
Sodium: 133 mmol/L — ABNORMAL LOW (ref 135–145)
Total Bilirubin: 0.5 mg/dL (ref 0.0–1.2)
Total Protein: 6.2 g/dL — ABNORMAL LOW (ref 6.5–8.1)

## 2023-12-01 LAB — CBC
HCT: 32.6 % — ABNORMAL LOW (ref 39.0–52.0)
Hemoglobin: 10.7 g/dL — ABNORMAL LOW (ref 13.0–17.0)
MCH: 30.6 pg (ref 26.0–34.0)
MCHC: 32.8 g/dL (ref 30.0–36.0)
MCV: 93.1 fL (ref 80.0–100.0)
Platelets: 155 10*3/uL (ref 150–400)
RBC: 3.5 MIL/uL — ABNORMAL LOW (ref 4.22–5.81)
RDW: 15.2 % (ref 11.5–15.5)
WBC: 4.2 10*3/uL (ref 4.0–10.5)
nRBC: 0 % (ref 0.0–0.2)

## 2023-12-01 MED ORDER — ONDANSETRON 4 MG PO TBDP
4.0000 mg | ORAL_TABLET | Freq: Three times a day (TID) | ORAL | 0 refills | Status: DC | PRN
  Filled 2023-12-01: qty 20, 7d supply, fill #0

## 2023-12-01 MED ORDER — BISACODYL 5 MG PO TBEC
5.0000 mg | DELAYED_RELEASE_TABLET | Freq: Every day | ORAL | 0 refills | Status: DC | PRN
  Filled 2023-12-01: qty 30, 30d supply, fill #0

## 2023-12-01 MED ORDER — OXYCODONE HCL 5 MG PO TABS
5.0000 mg | ORAL_TABLET | Freq: Four times a day (QID) | ORAL | 0 refills | Status: DC | PRN
  Filled 2023-12-01: qty 20, 5d supply, fill #0

## 2023-12-01 MED ORDER — ONDANSETRON 4 MG PO TBDP: 4.0000 mg | ORAL_TABLET | Freq: Three times a day (TID) | ORAL | 0 refills | Status: DC | PRN

## 2023-12-01 NOTE — TOC Transition Note (Addendum)
Transition of Care [facility]) - Discharge Note   Patient Details  Name: Gary [name] MRN: [id number] Date of Birth: 08-21-1986  Transition of Care [facility]) CM/SW Contact:  Lanier Clam, RN Phone Number: 12/01/2023, 10:54 AM   Clinical Narrative:  Resources provided. Chaplain to provide clothing-scarf,gloves,blanket. Has own transport to shelter.No further CM needs.  -2p bus pass given.     Final next level of care: Homeless Shelter Barriers to Discharge: No Barriers Identified   Patient Goals and CMS Choice            Discharge Placement                       Discharge Plan and Services Additional resources added to the After Visit Summary for   In-house Referral: Clinical Social Work                                   Social Drivers of Health (SDOH) Interventions SDOH Screenings   Food Insecurity: Food Insecurity Present (11/25/2023)  Housing: High Risk (11/25/2023)  Transportation Needs: Unmet Transportation Needs (11/25/2023)  Utilities: Patient Unable To Answer (11/25/2023)  Recent Concern: Utilities - At Risk (11/11/2023)  Alcohol Screen: High Risk (07/25/2021)  Depression (PHQ2-9): Medium Risk (02/12/2021)  Financial Resource Strain: Patient Declined (10/11/2023)  Social Connections: Unknown (06/24/2022)   Received from [facility]  Stress: No Stress Concern Present (06/30/2022)   Received from [facility]  Recent Concern: Stress - Stress Concern Present (06/25/2022)   Received from [facility]  Tobacco Use: High Risk (11/24/2023)     Readmission Risk Interventions    11/29/2023   10:27 AM 11/22/2023   10:36 AM 11/12/2023   12:41 PM  Readmission Risk Prevention Plan  Transportation Screening Complete Complete Complete  Medication Review Oceanographer) Complete Complete Complete  PCP or Specialist appointment within 3-5 days of discharge  Complete   HRI or Home Care Consult Complete Complete Complete  SW Recovery  Care/Counseling Consult Complete Complete Complete  Palliative Care Screening Not Applicable Not Applicable Not Applicable  Skilled Nursing Facility Not Applicable Not Applicable Not Applicable

## 2023-12-01 NOTE — Discharge Summary (Signed)
Physician Discharge Summary  Gary [name]:[id number] DOB: 11-Sep-1986 DOA: 11/24/2023  PCP: Patient, No Pcp Per  Admit date: 11/24/2023 Discharge date: 12/01/2023  Admitted From: Home Disposition: Home, resources provided by [facility]  Recommendations for Outpatient Follow-up:  Follow up with PCP in 1-2 weeks Please obtain BMP/CBC in one week your next doctors visit.  Pain medication, bowel regimen and antiemetic prescribed Patient does not take outpatient Keppra or other routine medications   Discharge Condition: Stable CODE STATUS: Full code Diet recommendation: Regular  Brief/Interim Summary: Brief Narrative:    38 y.o. male with medical history significant of alcohol abuse, substance abuse, noncompliant HIV comes to the hospital with complaints of abdominal pain.  Patient was admitted to the hospital from 1/9 - 1/14 for alcohol induced acute pancreatitis and discharged.  Upon admission patient was started on fluid, diet was slowly advanced.  Case reviewed with GI who did not think patient really had necrosis therefore antibiotics were discontinued.  Slow to improve.  Today doing significantly well and okay with discharge as he is tolerating p.o.  Assessment & Plan:  Principal Problem:   Acute pancreatitis Active Problems:   Polysubstance abuse (HCC)   Homelessness   Alcohol induced acute pancreatitis     Acute alcoholic pancreatitis Alcohol abuse - Completed Abrahim taper, no longer showing signs of any alcohol withdrawal.  Counseled to quit using alcohol.  He is tolerating p.o. without any issues.  Will discharge him on p.o. pain medication, antiemetics and bowel regimen.   HIV  -Has not been taking his Biktarvy. Follows at [facility]. Noncompliant.    History of depression Peripheral neuropathy/seizure - Denies taking Lexapro, gabapentin, Keppra.    DVT prophylaxis: Lovenox Code Status: Full code code Family Communication:  Feeling well, discharge  today   Subjective: Still having abdominal pain but some improvement today  Examination:  General exam: Appears calm and comfortable, cachectic frail with bilateral temporal wasting.  Appears older than stated age Respiratory system: Clear to auscultation. Respiratory effort normal. Cardiovascular system: S1 & S2 heard, RRR. No JVD, murmurs, rubs, gallops or clicks. No pedal edema. Gastrointestinal system: Abdomen is nondistended, soft and nontender. No organomegaly or masses felt. Normal bowel sounds heard. Central nervous system: Alert and oriented. No focal neurological deficits. Extremities: Symmetric 5 x 5 power. Skin: No rashes, lesions or ulcers Psychiatry: Judgement and insight appear normal. Mood & affect appropriate.    Discharge Diagnoses:  Principal Problem:   Acute pancreatitis Active Problems:   Polysubstance abuse (HCC)   Homelessness   Alcohol induced acute pancreatitis   Alcohol withdrawal (HCC)      Discharge Exam: Vitals:   11/30/23 2141 12/01/23 0347  BP: 92/64 95/61  Pulse: (!) 58 (!) 55  Resp:  15  Temp:  98 F (36.7 C)  SpO2:  97%   Vitals:   11/30/23 2130 11/30/23 2141 12/01/23 0347 12/01/23 0500  BP: (!) 88/57 92/64 95/61    Pulse: (!) 57 (!) 58 (!) 55   Resp: 16  15   Temp: 98.2 F (36.8 C)  98 F (36.7 C)   TempSrc: Oral  Oral   SpO2: 98%  97%   Weight:    60.6 kg  Height:          Discharge Instructions  Discharge Instructions     meds to beds pharmacy consult ([facility] ONLY)   Complete by: As directed       Allergies as of 12/01/2023       Reactions  Tegretol [carbamazepine] Other (See Comments)   Vertigo and paralysis   Caffeine Palpitations        Medication List     PAUSE taking these medications    Biktarvy 50-200-25 MG Tabs tablet Wait to take this until your doctor or other care provider tells you to start again. Generic drug: bictegravir-emtricitabine-tenofovir AF Take 1 tablet by mouth daily.        STOP taking these medications    escitalopram 5 MG tablet Commonly known as: LEXAPRO   gabapentin 300 MG capsule Commonly known as: Neurontin   hydrOXYzine 10 MG tablet Commonly known as: ATARAX   lipase/protease/amylase 32440 UNITS Cpep capsule Commonly known as: CREON   pantoprazole 40 MG tablet Commonly known as: PROTONIX   sucralfate 1 g tablet Commonly known as: CARAFATE       TAKE these medications    bisacodyl 5 MG EC tablet Commonly known as: DULCOLAX Take 1 tablet (5 mg total) by mouth daily as needed for moderate constipation.   levETIRAcetam 500 MG tablet Commonly known as: Keppra Take 1 tablet (500 mg total) by mouth 2 (two) times daily.   ondansetron 4 MG disintegrating tablet Commonly known as: ZOFRAN-ODT Dissolve 1 tablet (4 mg total) by mouth every 8 (eight) hours as needed for nausea or vomiting. What changed: You were already taking a medication with the same name, and this prescription was added. Make sure you understand how and when to take each.   ondansetron 4 MG disintegrating tablet Commonly known as: ZOFRAN-ODT Dissolve 1 tablet (4 mg total) by mouth every 8 (eight) hours as needed for nausea or vomiting. What changed: Another medication with the same name was added. Make sure you understand how and when to take each.   oxyCODONE 5 MG immediate release tablet Commonly known as: Oxy IR/ROXICODONE Take 1 tablet (5 mg total) by mouth every 6 (six) hours as needed for severe pain (pain score 7-10) or moderate pain (pain score 4-6). What changed: reasons to take this   thiamine 100 MG tablet Commonly known as: VITAMIN B1 Take 100 mg by mouth daily.        Follow-up Information     [facility]. Schedule an appointment as soon as possible for a visit.   Contact information: [address] [place] [address] [phone]               Allergies  Allergen  Reactions   Tegretol [Carbamazepine] Other (See Comments)    Vertigo and paralysis   Caffeine Palpitations    You were cared for by a hospitalist during your hospital stay. If you have any questions about your discharge medications or the care you received while you were in the hospital after you are discharged, you can call the unit and asked to speak with the hospitalist on call if the hospitalist that took care of you is not available. Once you are discharged, your primary care physician will handle any further medical issues. Please note that no refills for any discharge medications will be authorized once you are discharged, as it is imperative that you return to your primary care physician (or establish a relationship with a primary care physician if you do not have one) for your aftercare needs so that they can reassess your need for medications and monitor your lab values.  You were cared for by a hospitalist during your hospital stay. If you have any questions about your discharge  medications or the care you received while you were in the hospital after you are discharged, you can call the unit and asked to speak with the hospitalist on call if the hospitalist that took care of you is not available. Once you are discharged, your primary care physician will handle any further medical issues. Please note that NO REFILLS for any discharge medications will be authorized once you are discharged, as it is imperative that you return to your primary care physician (or establish a relationship with a primary care physician if you do not have one) for your aftercare needs so that they can reassess your need for medications and monitor your lab values.  Please request your Prim.MD to go over all Hospital Tests and Procedure/Radiological results at the follow up, please get all Hospital records sent to your Prim MD by signing hospital release before you go home.  Get CBC, CMP, 2 view Chest X ray checked  by  Primary MD during your next visit or SNF MD in 5-7 days ( we routinely change or add medications that can affect your baseline labs and fluid status, therefore we recommend that you get the mentioned basic workup next visit with your PCP, your PCP may decide not to get them or add new tests based on their clinical decision)  On your next visit with your primary care physician please Get Medicines reviewed and adjusted.  If you experience worsening of your admission symptoms, develop shortness of breath, life threatening emergency, suicidal or homicidal thoughts you must seek medical attention immediately by calling 911 or calling your MD immediately  if symptoms less severe.  You Must read complete instructions/literature along with all the possible adverse reactions/side effects for all the Medicines you take and that have been prescribed to you. Take any new Medicines after you have completely understood and accpet all the possible adverse reactions/side effects.   Do not drive, operate heavy machinery, perform activities at heights, swimming or participation in water activities or provide baby sitting services if your were admitted for syncope or siezures until you have seen by Primary MD or a Neurologist and advised to do so again.  Do not drive when taking Pain medications.   Procedures/Studies: CT ABDOMEN PELVIS W CONTRAST Result Date: 11/25/2023 CLINICAL DATA:  Upper abdominal pain EXAM: CT ABDOMEN AND PELVIS WITH CONTRAST TECHNIQUE: Multidetector CT imaging of the abdomen and pelvis was performed using the standard protocol following bolus administration of intravenous contrast. RADIATION DOSE REDUCTION: This exam was performed according to the departmental dose-optimization program which includes automated exposure control, adjustment of the mA and/or kV according to patient size and/or use of iterative reconstruction technique. CONTRAST:  OMNIPAQUE IOHEXOL 300 MG/ML  SOLN COMPARISON:   11/18/2023 FINDINGS: Lower chest: Stable mild elevation of the left hemidiaphragm. Hepatobiliary: Diffuse low-density throughout the liver compatible with fatty infiltration. No focal abnormality. Gallbladder unremarkable. Pancreas: Continued fluid/stranding around the pancreatic head and uncinate process. Truncation of the pancreatic tail. Mild pancreatic ductal dilatation. Focal fluid collection in the pancreatic head measuring 1.3 cm, unchanged. Areas of decreased perfusion in the pancreatic head are stable and may reflect necrosis. Spleen: No focal abnormality.  Normal size. Adrenals/Urinary Tract: No suspicious renal or adrenal lesion. No stones or hydronephrosis. Urinary bladder decompressed, unremarkable. Stomach/Bowel: Normal appendix. Stomach, large and small bowel grossly unremarkable. Vascular/Lymphatic: No evidence of aneurysm or adenopathy. Reproductive: No visible focal abnormality. Other: No free fluid or free air. Musculoskeletal: No acute bony abnormality. IMPRESSION: Similar changes  of acute pancreatitis. 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 Stable areas of decreased perfusion in the pancreatic head which may reflect areas of necrosis. Hepatic steatosis. Electronically Signed   By: Charlett Nose M.D.   On: 11/25/2023 11:10   DG Chest 2 View Result Date: 11/25/2023 CLINICAL DATA:  Chest pain, upper abdominal pain EXAM: CHEST - 2 VIEW COMPARISON:  11/06/2023 FINDINGS: Heart and mediastinal contours are within normal limits. No focal opacities or effusions. No acute bony abnormality. IMPRESSION: No active cardiopulmonary disease. Electronically Signed   By: Charlett Nose M.D.   On: 11/25/2023 11:06   CT ABDOMEN PELVIS W CONTRAST Result Date: 11/18/2023 CLINICAL DATA:  Severe acute pancreatitis. Abdominal pain. History of alcoholic pancreatitis. EXAM: CT ABDOMEN AND PELVIS WITH CONTRAST TECHNIQUE: Multidetector CT imaging of the abdomen and pelvis was performed using the standard  protocol following bolus administration of intravenous contrast. RADIATION DOSE REDUCTION: This exam was performed according to the departmental dose-optimization program which includes automated exposure control, adjustment of the mA and/or kV according to patient size and/or use of iterative reconstruction technique. CONTRAST:  OMNIPAQUE IOHEXOL 300 MG/ML  SOLN COMPARISON:  10/28/2023 FINDINGS: Lower chest: Left lower lobe atelectasis or pneumonia. Hepatobiliary: Hepatic steatosis. Unremarkable gallbladder and biliary tree. Pancreas: Pancreatic atrophy. Redemonstrated inflammatory stranding about the pancreatic head. The previous 2.1 cm pseudocyst in the uncinate process is not well visualized today. Unchanged 1.4 cm pseudocyst in the pancreatic head. Similar areas of poor enhancement in the pancreatic head and uncinate process suspicious for necrosis. No pancreatic ductal dilation. The overall appearance is similar to 10/28/2023. Spleen: Unremarkable. Adrenals/Urinary Tract: Normal adrenal glands. No urinary calculi or hydronephrosis. Unremarkable bladder. Stomach/Bowel: Normal caliber large and small bowel. Colonic wall thickening and mucosal hyperenhancement near the hepatic flexure likely reactive secondary to pancreatitis. Wall thickening of the duodenum and stomach with mucosal hyperenhancement is also favored reactive. Normal appendix. 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 Ill-defined filling defects in the superior mesenteric vein are favored due to mixing artifact. Thrombus is considered less likely though not excluded without delayed phase. No lymphadenopathy. Normal caliber aorta. Reproductive: Unremarkable. Other: Peripancreatic free fluid and free fluid in the pelvis. No organized fluid collection or abscess. No free intraperitoneal air. Musculoskeletal: No acute fracture. IMPRESSION: 1. Similar findings of acute pancreatitis with areas of poor  enhancement in the pancreatic head and uncinate process suspicious for necrosis. 2. Reactive duodenitis, gastritis, and colitis near the hepatic flexure. 3. 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 4. Left lower lobe atelectasis or pneumonia. 5. Hepatic steatosis. Electronically Signed   By: Minerva Fester M.D.   On: 11/18/2023 01:25   DG Chest 2 View Result Date: 11/06/2023 CLINICAL DATA:  Hypotension. EXAM: CHEST - 2 VIEW COMPARISON:  Radiograph 8 days ago 11/01/2023 FINDINGS: Significant patient rotation. Chronic elevation of left hemidiaphragm. There is increasing patchy opacity at the left lung base. The right lung is clear. Grossly stable heart size and mediastinal contours allowing for rotation on the current exam. No pneumothorax or large pleural effusion. Normal pulmonary vasculature. IMPRESSION: 1. Increasing patchy opacity at the left lung base, suspicious for pneumonia. 2. Chronic elevation of left hemidiaphragm. Electronically Signed   By: Narda Rutherford M.D.   On: 11/06/2023 20:57     The results of significant diagnostics from this hospitalization (including imaging, microbiology, ancillary and laboratory) are listed below for reference.     Microbiology: No results  found for this or any previous visit (from the past 240 hours).   Labs: BNP (last 3 results) No results for input(s): "BNP" in the last 8760 hours. Basic Metabolic Panel: Recent Labs  Lab 11/24/23 1954 11/26/23 0842 11/27/23 0608 11/28/23 0535 11/29/23 0421 11/30/23 0502 12/01/23 0502  NA 135 134*  --   --   --  134* 133*  K 3.6 4.5  --   --   --  3.6 3.8  CL 97* 103  --   --   --  104 101  CO2 25 24  --   --   --  24 27  GLUCOSE 90 75  --   --   --  96 89  BUN 18 9  --   --   --  8 10  CREATININE 0.70 0.34*  --   --   --  0.42* 0.39*  CALCIUM 9.0 8.6*  --   --   --  8.8* 8.9  MG  --  2.1 1.8 1.8 1.8 1.8  --   PHOS  --  2.5  --   --   --   --   --    Liver Function  Tests: Recent Labs  Lab 11/24/23 1954 11/26/23 0842 11/30/23 0502 12/01/23 0502  AST 18 15 15  14*  ALT 13 11 14 14   ALKPHOS 61 43 37* 35*  BILITOT 0.4 0.9 0.5 0.5  PROT 8.5* 5.9* 6.3* 6.2*  ALBUMIN 4.2 3.2* 3.3* 3.3*   Recent Labs  Lab 11/24/23 1954 11/27/23 0608 11/28/23 0535 11/29/23 0421  LIPASE 399* 68* 99* 118*   No results for input(s): "AMMONIA" in the last 168 hours. CBC: Recent Labs  Lab 11/27/23 0608 11/28/23 0535 11/29/23 0421 11/30/23 0502 12/01/23 0502  WBC 2.5* 3.0* 3.9* 3.5* 4.2  HGB 10.3* 10.6* 10.8* 11.1* 10.7*  HCT 31.2* 32.4* 32.8* 33.2* 32.6*  MCV 92.6 92.8 91.9 90.2 93.1  PLT 143* 146* 168 126* 155   Cardiac Enzymes: No results for input(s): "CKTOTAL", "CKMB", "CKMBINDEX", "TROPONINI" in the last 168 hours. BNP: Invalid input(s): "POCBNP" CBG: No results for input(s): "GLUCAP" in the last 168 hours. D-Dimer No results for input(s): "DDIMER" in the last 72 hours. Hgb A1c No results for input(s): "HGBA1C" in the last 72 hours. Lipid Profile No results for input(s): "CHOL", "HDL", "LDLCALC", "TRIG", "CHOLHDL", "LDLDIRECT" in the last 72 hours. Thyroid function studies No results for input(s): "TSH", "T4TOTAL", "T3FREE", "THYROIDAB" in the last 72 hours.  Invalid input(s): "FREET3" Anemia work up No results for input(s): "VITAMINB12", "FOLATE", "FERRITIN", "TIBC", "IRON", "RETICCTPCT" in the last 72 hours. Urinalysis    Component Value Date/Time   COLORURINE YELLOW 11/25/2023 1309   APPEARANCEUR CLEAR 11/25/2023 1309   APPEARANCEUR Clear 11/15/2014 1755   LABSPEC 1.015 11/25/2023 1309   LABSPEC 1.002 11/15/2014 1755   PHURINE 5.0 11/25/2023 1309   GLUCOSEU NEGATIVE 11/25/2023 1309   GLUCOSEU Negative 11/15/2014 1755   HGBUR NEGATIVE 11/25/2023 1309   BILIRUBINUR NEGATIVE 11/25/2023 1309   BILIRUBINUR Negative 11/15/2014 1755   KETONESUR NEGATIVE 11/25/2023 1309   PROTEINUR NEGATIVE 11/25/2023 1309   NITRITE NEGATIVE 11/25/2023  1309   LEUKOCYTESUR NEGATIVE 11/25/2023 1309   LEUKOCYTESUR Negative 11/15/2014 1755   Sepsis Labs Recent Labs  Lab 11/28/23 0535 11/29/23 0421 11/30/23 0502 12/01/23 0502  WBC 3.0* 3.9* 3.5* 4.2   Microbiology No results found for this or any previous visit (from the past 240 hours).   Time coordinating discharge:  I  have spent 35 minutes face to face with the patient and on the ward discussing the patients care, assessment, plan and disposition with other care givers. >50% of the time was devoted counseling the patient about the risks and benefits of treatment/Discharge disposition and coordinating care.   SIGNED:   [name], MD  [facility] 12/01/2023, 10:57 AM   If 7PM-7AM, please contact night-coverage

## 2023-12-02 ENCOUNTER — Other Ambulatory Visit (HOSPITAL_COMMUNITY): Payer: Self-pay

## 2023-12-03 ENCOUNTER — Other Ambulatory Visit: Payer: Self-pay

## 2023-12-03 ENCOUNTER — Other Ambulatory Visit (HOSPITAL_COMMUNITY): Payer: Self-pay

## 2023-12-03 ENCOUNTER — Emergency Department (HOSPITAL_COMMUNITY)
Admission: EM | Admit: 2023-12-03 | Discharge: 2023-12-03 | Disposition: A | Discharge status: Home / Self Care | Payer: MEDICAID | Attending: Emergency Medicine | Admitting: Emergency Medicine

## 2023-12-03 ENCOUNTER — Encounter (HOSPITAL_COMMUNITY): Payer: Self-pay | Admitting: Emergency Medicine

## 2023-12-03 DIAGNOSIS — Z21 Asymptomatic human immunodeficiency virus [HIV] infection status: Secondary | ICD-10-CM | POA: Insufficient documentation

## 2023-12-03 DIAGNOSIS — F10920 Alcohol use, unspecified with intoxication, uncomplicated: Secondary | ICD-10-CM

## 2023-12-03 DIAGNOSIS — F10220 Alcohol dependence with intoxication, uncomplicated: Secondary | ICD-10-CM | POA: Insufficient documentation

## 2023-12-03 NOTE — ED Provider Notes (Signed)
[place] EMERGENCY DEPARTMENT AT [facility] Provider Note   CSN: [id number] Arrival date & time: 12/03/23  2216     History  Chief Complaint  Patient presents with   Alcohol Intoxication   Cold Exposure    LEVON BOETTCHER is a 38 y.o. male.  The history is provided by the patient.  Alcohol Intoxication This is a recurrent problem. The current episode started 1 to 2 hours ago. The problem occurs constantly. The problem has not changed since onset.Pertinent negatives include no chest pain, no abdominal pain, no headaches and no shortness of breath. Nothing aggravates the symptoms. Nothing relieves the symptoms. He has tried nothing for the symptoms. The treatment provided no relief.  Patient with alcohol abuse who presents following drinking alcohol.  No complaints.      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 Authorizing Provider  bictegravir-emtricitabine-tenofovir AF (BIKTARVY) 50-200-25 MG TABS tablet Take 1 tablet by mouth daily. Patient not taking: Reported on 11/25/2023 11/16/23   Briant Cedar, MD  bisacodyl (DULCOLAX) 5 MG EC tablet Take 1 tablet (5 mg total) by mouth daily as needed for moderate constipation. 12/01/23   Amin, Ankit C, MD  levETIRAcetam (KEPPRA) 500 MG tablet Take 1 tablet (500 mg total) by mouth 2 (two) times daily. Patient not taking: Reported on 11/25/2023 11/18/23   Gunnar Bulla, MD  ondansetron (ZOFRAN-ODT) 4 MG disintegrating tablet Dissolve 1 tablet (4 mg total) by mouth every 8 (eight) hours as needed for nausea or vomiting. 12/01/23   Amin, Ankit C, MD   ondansetron (ZOFRAN-ODT) 4 MG disintegrating tablet Dissolve 1 tablet (4 mg total) by mouth every 8 (eight) hours as needed for nausea or vomiting. 12/01/23   Amin, Ankit C, MD  oxyCODONE (OXY IR/ROXICODONE) 5 MG immediate release tablet Take 1 tablet (5 mg total) by mouth every 6 (six) hours as needed for severe pain (pain score 7-10) or moderate pain (pain score 4-6). 12/01/23   Amin, Ankit C, MD  thiamine (VITAMIN B-1) 100 MG tablet Take 100 mg by mouth daily. Patient not taking: Reported on 11/25/2023    [provider]  famotidine (PEPCID) 20 MG tablet Take 1 tablet (20 mg total) by mouth 2 (two) times daily. Patient not taking: Reported on 06/01/2019 05/14/19 06/02/19  Benjiman Core, MD      Allergies    Tegretol [carbamazepine] and Caffeine    Review of Systems   Review of Systems  Constitutional:  Negative for fever.  Respiratory:  Negative for shortness of breath.   Cardiovascular:  Negative for chest pain.  Gastrointestinal:  Negative for abdominal pain.  Neurological:  Negative for headaches.  Psychiatric/Behavioral:  Negative for suicidal ideas.   All other systems reviewed and are negative.   Physical Exam Updated Vital Signs BP 101/81   Pulse 88   Temp (!) 96 F (35.6 C) (Oral)   Resp 16   Ht 6' (1.829 m)   Wt 60 kg   SpO2 100%   BMI 17.94 kg/m  Physical Exam Vitals and nursing note  reviewed.  Constitutional:      General: He is not in acute distress.    Appearance: Normal appearance. He is well-developed. He is not diaphoretic.  HENT:     Head: Normocephalic and atraumatic.     Nose: Nose normal.  Eyes:     Conjunctiva/sclera: Conjunctivae normal.     Pupils: Pupils are equal, round, and reactive to light.  Cardiovascular:     Rate and Rhythm: Normal rate and regular rhythm.  Pulmonary:     Effort: Pulmonary effort is normal.     Breath sounds: Normal breath sounds. No wheezing or rales.  Abdominal:     General: Bowel sounds are normal.      Palpations: Abdomen is soft.     Tenderness: There is no abdominal tenderness. There is no guarding or rebound.  Musculoskeletal:        General: Normal range of motion.     Cervical back: Normal range of motion and neck supple.  Skin:    General: Skin is warm and dry.     Capillary Refill: Capillary refill takes less than 2 seconds.  Neurological:     General: No focal deficit present.     Mental Status: He is alert and oriented to person, place, and time.     Deep Tendon Reflexes: Reflexes normal.     ED Results / Procedures / Treatments   Labs (all labs ordered are listed, but only abnormal results are displayed) Labs Reviewed - No data to display  EKG None  Radiology No results found.  Procedures Procedures    Medications Ordered in ED Medications - No data to display  ED Course/ Medical Decision Making/ A&P                                 Medical Decision Making Patient who BIB EMS for drinking alcohol   Amount and/or Complexity of Data Reviewed External Data Reviewed: notes.    Details: Previous notes reviewed   Risk Risk Details: Well appearing with normal exam and no complaints.  Stable for discharge.  Strict returns    Final Clinical Impression(s) / ED Diagnoses Final diagnoses:  Alcoholic intoxication without complication (HCC)   Return for intractable cough, coughing up blood, fevers > 100.4 unrelieved by medication, shortness of breath, intractable vomiting, chest pain, shortness of breath, weakness, numbness, changes in speech, facial asymmetry, abdominal pain, passing out, Inability to tolerate liquids or food, cough, altered mental status or any concerns. No signs of systemic illness or infection. The patient is nontoxic-appearing on exam and vital signs are within normal limits.  I have reviewed the triage vital signs and the nursing notes. Pertinent labs & imaging results that were available during my care of the patient were reviewed by me and  considered in my medical decision making (see chart for details). After history, exam, and medical workup I feel the patient has been appropriately medically screened and is safe for discharge home. Pertinent diagnoses were discussed with the patient. Patient was given return precautions Rx / DC Orders ED Discharge Orders     None         Helio Lack, MD 12/03/23 2311

## 2023-12-03 NOTE — ED Triage Notes (Signed)
Patient came in with EMS c/o alcohol intoxication. Patient report his been drinking alcohol outside with his friends.

## 2023-12-04 ENCOUNTER — Inpatient Hospital Stay (HOSPITAL_COMMUNITY)
Admission: EM | Admit: 2023-12-04 | Discharge: 2023-12-08 | DRG: 439 | Discharge status: Against Medical Advice | Payer: MEDICAID | Attending: Internal Medicine | Admitting: Internal Medicine

## 2023-12-04 ENCOUNTER — Other Ambulatory Visit: Payer: Self-pay

## 2023-12-04 DIAGNOSIS — F419 Anxiety disorder, unspecified: Secondary | ICD-10-CM | POA: Diagnosis present

## 2023-12-04 DIAGNOSIS — F1910 Other psychoactive substance abuse, uncomplicated: Secondary | ICD-10-CM | POA: Diagnosis present

## 2023-12-04 DIAGNOSIS — F1020 Alcohol dependence, uncomplicated: Secondary | ICD-10-CM | POA: Diagnosis present

## 2023-12-04 DIAGNOSIS — K861 Other chronic pancreatitis: Secondary | ICD-10-CM | POA: Diagnosis present

## 2023-12-04 DIAGNOSIS — Z91128 Patient's intentional underdosing of medication regimen for other reason: Secondary | ICD-10-CM

## 2023-12-04 DIAGNOSIS — F17210 Nicotine dependence, cigarettes, uncomplicated: Secondary | ICD-10-CM | POA: Diagnosis present

## 2023-12-04 DIAGNOSIS — E860 Dehydration: Secondary | ICD-10-CM | POA: Diagnosis present

## 2023-12-04 DIAGNOSIS — F1510 Other stimulant abuse, uncomplicated: Secondary | ICD-10-CM | POA: Diagnosis present

## 2023-12-04 DIAGNOSIS — Z811 Family history of alcohol abuse and dependence: Secondary | ICD-10-CM

## 2023-12-04 DIAGNOSIS — X31XXXA Exposure to excessive natural cold, initial encounter: Secondary | ICD-10-CM

## 2023-12-04 DIAGNOSIS — F10220 Alcohol dependence with intoxication, uncomplicated: Secondary | ICD-10-CM | POA: Diagnosis present

## 2023-12-04 DIAGNOSIS — G40509 Epileptic seizures related to external causes, not intractable, without status epilepticus: Secondary | ICD-10-CM | POA: Diagnosis present

## 2023-12-04 DIAGNOSIS — F4310 Post-traumatic stress disorder, unspecified: Secondary | ICD-10-CM | POA: Diagnosis present

## 2023-12-04 DIAGNOSIS — F1994 Other psychoactive substance use, unspecified with psychoactive substance-induced mood disorder: Secondary | ICD-10-CM | POA: Diagnosis present

## 2023-12-04 DIAGNOSIS — K859 Acute pancreatitis without necrosis or infection, unspecified: Secondary | ICD-10-CM | POA: Diagnosis present

## 2023-12-04 DIAGNOSIS — K863 Pseudocyst of pancreas: Secondary | ICD-10-CM | POA: Diagnosis present

## 2023-12-04 DIAGNOSIS — Z800 Family history of malignant neoplasm of digestive organs: Secondary | ICD-10-CM

## 2023-12-04 DIAGNOSIS — K760 Fatty (change of) liver, not elsewhere classified: Secondary | ICD-10-CM | POA: Diagnosis present

## 2023-12-04 DIAGNOSIS — I959 Hypotension, unspecified: Secondary | ICD-10-CM | POA: Diagnosis present

## 2023-12-04 DIAGNOSIS — D649 Anemia, unspecified: Secondary | ICD-10-CM | POA: Diagnosis present

## 2023-12-04 DIAGNOSIS — Z21 Asymptomatic human immunodeficiency virus [HIV] infection status: Secondary | ICD-10-CM | POA: Diagnosis present

## 2023-12-04 DIAGNOSIS — B20 Human immunodeficiency virus [HIV] disease: Secondary | ICD-10-CM | POA: Diagnosis present

## 2023-12-04 DIAGNOSIS — Z5900 Homelessness unspecified: Secondary | ICD-10-CM

## 2023-12-04 DIAGNOSIS — F209 Schizophrenia, unspecified: Secondary | ICD-10-CM | POA: Diagnosis present

## 2023-12-04 DIAGNOSIS — T50996A Underdosing of other drugs, medicaments and biological substances, initial encounter: Secondary | ICD-10-CM | POA: Diagnosis present

## 2023-12-04 DIAGNOSIS — Z5329 Procedure and treatment not carried out because of patient's decision for other reasons: Secondary | ICD-10-CM | POA: Diagnosis present

## 2023-12-04 DIAGNOSIS — E876 Hypokalemia: Secondary | ICD-10-CM | POA: Diagnosis present

## 2023-12-04 DIAGNOSIS — K5900 Constipation, unspecified: Secondary | ICD-10-CM | POA: Diagnosis present

## 2023-12-04 DIAGNOSIS — Z79899 Other long term (current) drug therapy: Secondary | ICD-10-CM

## 2023-12-04 DIAGNOSIS — Z888 Allergy status to other drugs, medicaments and biological substances status: Secondary | ICD-10-CM

## 2023-12-04 DIAGNOSIS — R569 Unspecified convulsions: Secondary | ICD-10-CM

## 2023-12-04 DIAGNOSIS — K852 Alcohol induced acute pancreatitis without necrosis or infection: Principal | ICD-10-CM | POA: Diagnosis present

## 2023-12-04 DIAGNOSIS — Z7151 Drug abuse counseling and surveillance of drug abuser: Secondary | ICD-10-CM

## 2023-12-04 LAB — COMPREHENSIVE METABOLIC PANEL
ALT: 16 U/L (ref 0–44)
AST: 25 U/L (ref 15–41)
Albumin: 4 g/dL (ref 3.5–5.0)
Alkaline Phosphatase: 51 U/L (ref 38–126)
Anion gap: 17 — ABNORMAL HIGH (ref 5–15)
BUN: 13 mg/dL (ref 6–20)
CO2: 21 mmol/L — ABNORMAL LOW (ref 22–32)
Calcium: 9 mg/dL (ref 8.9–10.3)
Chloride: 102 mmol/L (ref 98–111)
Creatinine, Ser: 0.76 mg/dL (ref 0.61–1.24)
GFR, Estimated: >60 mL/min (ref >60)
Glucose, Bld: 91 mg/dL (ref 70–99)
Potassium: 4.2 mmol/L (ref 3.5–5.1)
Sodium: 140 mmol/L (ref 135–145)
Total Bilirubin: 0.8 mg/dL (ref 0.0–1.2)
Total Protein: 7.1 g/dL (ref 6.5–8.1)

## 2023-12-04 LAB — LIPASE, BLOOD: Lipase: 391 U/L — ABNORMAL HIGH (ref 11–51)

## 2023-12-04 MED ORDER — HYDROMORPHONE HCL 1 MG/ML IJ SOLN
1.0000 mg | Freq: Once | INTRAMUSCULAR | Status: AC
  Administered 2023-12-04: 1 mg via INTRAVENOUS
  Filled 2023-12-04: qty 1

## 2023-12-04 MED ORDER — LORAZEPAM 1 MG PO TABS
1.0000 mg | ORAL_TABLET | ORAL | Status: AC | PRN
Start: 2023-12-04 — End: 2023-12-07
  Administered 2023-12-05 (×2): 1 mg via ORAL
  Filled 2023-12-04 (×2): qty 1

## 2023-12-04 MED ORDER — ONDANSETRON HCL 4 MG/2ML IJ SOLN
4.0000 mg | Freq: Once | INTRAMUSCULAR | Status: AC
  Administered 2023-12-04: 4 mg via INTRAVENOUS
  Filled 2023-12-04: qty 2

## 2023-12-04 MED ORDER — ADULT MULTIVITAMIN W/MINERALS CH
1.0000 | ORAL_TABLET | Freq: Every day | ORAL | Status: DC
  Administered 2023-12-05 – 2023-12-08 (×4): 1 via ORAL
  Filled 2023-12-04 (×4): qty 1

## 2023-12-04 MED ORDER — SODIUM CHLORIDE 0.9 % IV BOLUS
1000.0000 mL | Freq: Once | INTRAVENOUS | Status: AC
  Administered 2023-12-04: 1000 mL via INTRAVENOUS

## 2023-12-04 MED ORDER — LORAZEPAM 2 MG/ML IJ SOLN
1.0000 mg | INTRAMUSCULAR | Status: AC | PRN
  Administered 2023-12-06 (×2): 2 mg via INTRAVENOUS
  Administered 2023-12-07 (×2): 1 mg via INTRAVENOUS
  Administered 2023-12-07 (×3): 2 mg via INTRAVENOUS
  Administered 2023-12-07: 1 mg via INTRAVENOUS
  Filled 2023-12-04 (×8): qty 1

## 2023-12-04 MED ORDER — THIAMINE MONONITRATE 100 MG PO TABS: 100.0000 mg | ORAL_TABLET | Freq: Every day | ORAL | Status: DC

## 2023-12-04 MED ORDER — FOLIC ACID 1 MG PO TABS
1.0000 mg | ORAL_TABLET | Freq: Every day | ORAL | Status: DC
  Administered 2023-12-05 – 2023-12-08 (×4): 1 mg via ORAL
  Filled 2023-12-04 (×4): qty 1

## 2023-12-04 MED ORDER — THIAMINE HCL 100 MG/ML IJ SOLN: 100.0000 mg | Freq: Every day | INTRAMUSCULAR | Status: DC

## 2023-12-04 NOTE — ED Provider Notes (Incomplete)
EMERGENCY DEPARTMENT AT [facility] Provider Note   CSN: [id number] Arrival date & time: 12/04/23  2229     History {Add pertinent medical, surgical, social history, OB history to HPI:1} Chief Complaint  Patient presents with   Abdominal Pain    [place] [name] is a 38 y.o. male.  Patient with past medical history significant for HIV, alcohol use disorder with history of frequent episodes of pancreatitis presents to the emergency room complaining of severe 10 out of 10 epigastric abdominal pain.  Patient also complains of associated nausea and vomiting.  He states his last alcoholic drink was this morning.  Patient currently denying shortness of breath, chest pain, urinary symptoms, diarrhea.  Patient does have history of alcohol withdrawal requiring ICU admission in the past.   Abdominal Pain      Home Medications Prior to Admission medications   Medication Sig Start Date End Date Taking? Authorizing Provider  bictegravir-emtricitabine-tenofovir AF (BIKTARVY) 50-200-25 MG TABS tablet Take 1 tablet by mouth daily. Patient not taking: Reported on 11/25/2023 11/16/23   Briant Cedar, MD  bisacodyl (DULCOLAX) 5 MG EC tablet Take 1 tablet (5 mg total) by mouth daily as needed for moderate constipation. 12/01/23   Amin, Ankit C, MD  levETIRAcetam (KEPPRA) 500 MG tablet Take 1 tablet (500 mg total) by mouth 2 (two) times daily. Patient not taking: Reported on 11/25/2023 11/18/23   Gunnar Bulla, MD  ondansetron (ZOFRAN-ODT) 4 MG disintegrating tablet Dissolve 1 tablet (4 mg total) by mouth every 8 (eight) hours as needed for nausea or vomiting. 12/01/23   Amin, Ankit C, MD  ondansetron (ZOFRAN-ODT) 4 MG disintegrating tablet Dissolve 1 tablet (4 mg total) by mouth every 8 (eight) hours as needed for nausea or vomiting. 12/01/23   Amin, Ankit C, MD  oxyCODONE (OXY IR/ROXICODONE) 5 MG immediate release tablet Take 1 tablet (5 mg total) by mouth every 6 (six) hours as needed  for severe pain (pain score 7-10) or moderate pain (pain score 4-6). 12/01/23   Amin, Ankit C, MD  thiamine (VITAMIN B-1) 100 MG tablet Take 100 mg by mouth daily. Patient not taking: Reported on 11/25/2023    [provider]  famotidine (PEPCID) 20 MG tablet Take 1 tablet (20 mg total) by mouth 2 (two) times daily. Patient not taking: Reported on 06/01/2019 05/14/19 06/02/19  Benjiman Core, MD      Allergies    Tegretol [carbamazepine] and Caffeine    Review of Systems   Review of Systems  Gastrointestinal:  Positive for abdominal pain.    Physical Exam Updated Vital Signs BP (!) 95/54 (BP Location: Left Arm)   Pulse (!) 109   Temp (!) 96.5 F (35.8 C) (Oral)   Resp 16   Ht 6' (1.829 m)   Wt 54.4 kg   SpO2 100%   BMI 16.27 kg/m  Physical Exam Vitals and nursing note reviewed.  Constitutional:      General: He is in acute distress.     Appearance: He is well-developed.  HENT:     Head: Normocephalic and atraumatic.  Eyes:     Conjunctiva/sclera: Conjunctivae normal.  Cardiovascular:     Rate and Rhythm: Normal rate and regular rhythm.  Pulmonary:     Effort: Pulmonary effort is normal. No respiratory distress.     Breath sounds: Normal breath sounds.  Abdominal:     Palpations: Abdomen is soft.     Tenderness: There is abdominal tenderness in the epigastric  area.  Musculoskeletal:        General: No swelling.     Cervical back: Neck supple.  Skin:    General: Skin is dry.     Capillary Refill: Capillary refill takes less than 2 seconds.  Neurological:     Mental Status: He is alert.  Psychiatric:        Mood and Affect: Mood normal.     ED Results / Procedures / Treatments   Labs (all labs ordered are listed, but only abnormal results are displayed) Labs Reviewed  CBC WITH DIFFERENTIAL/PLATELET  COMPREHENSIVE METABOLIC PANEL  LIPASE, BLOOD  URINALYSIS, ROUTINE W REFLEX MICROSCOPIC    EKG None  Radiology No results  found.  Procedures Procedures  {Document cardiac monitor, telemetry assessment procedure when appropriate:1}  Medications Ordered in ED Medications  LORazepam (ATIVAN) tablet 1-4 mg (has no administration in time range)    Or  LORazepam (ATIVAN) injection 1-4 mg (has no administration in time range)  thiamine (VITAMIN B1) tablet 100 mg (has no administration in time range)    Or  thiamine (VITAMIN B1) injection 100 mg (has no administration in time range)  folic acid (FOLVITE) tablet 1 mg (has no administration in time range)  multivitamin with minerals tablet 1 tablet (has no administration in time range)  sodium chloride 0.9 % bolus 1,000 mL (has no administration in time range)  ondansetron (ZOFRAN) injection 4 mg (has no administration in time range)  HYDROmorphone (DILAUDID) injection 1 mg (has no administration in time range)    ED Course/ Medical Decision Making/ A&P   {   Click here for ABCD2, HEART and other calculatorsREFRESH Note before signing :1}                              Medical Decision Making Amount and/or Complexity of Data Reviewed Labs: ordered.  Risk OTC drugs. Prescription drug management.   This patient presents to the ED for concern of epigastric abdominal pain, this involves an extensive number of treatment options, and is a complaint that carries with it a high risk of complications and morbidity.  The differential diagnosis includes pancreatitis, gastritis, cholecystitis, others   Co morbidities that complicate the patient evaluation  History of alcohol induced pancreatitis,   Additional history obtained:  Additional history obtained from EMS External records from outside source obtained and reviewed including recent discharge summaries, patient admitted 3 times this month for similar symptoms   Lab Tests:  I Ordered, and personally interpreted labs.  The pertinent results include: Lipase 391   Imaging Studies ordered:  I ordered  imaging studies including ***  I independently visualized and interpreted imaging which showed *** I agree with the radiologist interpretation   Cardiac Monitoring: / EKG:  The patient was maintained on a cardiac monitor.  I personally viewed and interpreted the cardiac monitored which showed an underlying rhythm of: ***   Consultations Obtained:  I requested consultation with the ***,  and discussed lab and imaging findings as well as pertinent plan - they recommend: ***   Problem List / ED Course / Critical interventions / Medication management   I ordered medication including saline bolus for fluid resuscitation, Dilaudid for abdominal pain, Zofran for nausea, CIWA protocol meds including thiamine, folic acid, and Ativan as needed for withdrawal symptoms Reevaluation of the patient after these medicines showed that the patient {resolved/improved/worsened:23923::"improved"} I have reviewed the patients home medicines and have made  adjustments as needed   Social Determinants of Health:  Patient is homeless   Test / Admission - Considered:  ***   {Document critical care time when appropriate:1} {Document review of labs and clinical decision tools ie heart score, Chads2Vasc2 etc:1}  {Document your independent review of radiology images, and any outside records:1} {Document your discussion with family members, caretakers, and with consultants:1} {Document social determinants of health affecting pt's care:1} {Document your decision making why or why not admission, treatments were needed:1} Final Clinical Impression(s) / ED Diagnoses Final diagnoses:  None    Rx / DC Orders ED Discharge Orders     None

## 2023-12-05 ENCOUNTER — Emergency Department (HOSPITAL_COMMUNITY): Payer: MEDICAID

## 2023-12-05 DIAGNOSIS — E876 Hypokalemia: Secondary | ICD-10-CM | POA: Diagnosis present

## 2023-12-05 DIAGNOSIS — Z800 Family history of malignant neoplasm of digestive organs: Secondary | ICD-10-CM | POA: Diagnosis not present

## 2023-12-05 DIAGNOSIS — E860 Dehydration: Secondary | ICD-10-CM | POA: Diagnosis present

## 2023-12-05 DIAGNOSIS — D649 Anemia, unspecified: Secondary | ICD-10-CM | POA: Diagnosis present

## 2023-12-05 DIAGNOSIS — Z811 Family history of alcohol abuse and dependence: Secondary | ICD-10-CM | POA: Diagnosis not present

## 2023-12-05 DIAGNOSIS — K859 Acute pancreatitis without necrosis or infection, unspecified: Secondary | ICD-10-CM | POA: Diagnosis present

## 2023-12-05 DIAGNOSIS — F17210 Nicotine dependence, cigarettes, uncomplicated: Secondary | ICD-10-CM | POA: Diagnosis present

## 2023-12-05 DIAGNOSIS — K863 Pseudocyst of pancreas: Secondary | ICD-10-CM | POA: Diagnosis present

## 2023-12-05 DIAGNOSIS — X31XXXA Exposure to excessive natural cold, initial encounter: Secondary | ICD-10-CM | POA: Diagnosis not present

## 2023-12-05 DIAGNOSIS — F209 Schizophrenia, unspecified: Secondary | ICD-10-CM | POA: Diagnosis present

## 2023-12-05 DIAGNOSIS — I959 Hypotension, unspecified: Secondary | ICD-10-CM | POA: Diagnosis present

## 2023-12-05 DIAGNOSIS — Z79899 Other long term (current) drug therapy: Secondary | ICD-10-CM | POA: Diagnosis not present

## 2023-12-05 DIAGNOSIS — Z5900 Homelessness unspecified: Secondary | ICD-10-CM | POA: Diagnosis not present

## 2023-12-05 DIAGNOSIS — Z888 Allergy status to other drugs, medicaments and biological substances status: Secondary | ICD-10-CM | POA: Diagnosis not present

## 2023-12-05 DIAGNOSIS — Z21 Asymptomatic human immunodeficiency virus [HIV] infection status: Secondary | ICD-10-CM | POA: Diagnosis present

## 2023-12-05 DIAGNOSIS — K852 Alcohol induced acute pancreatitis without necrosis or infection: Secondary | ICD-10-CM | POA: Diagnosis not present

## 2023-12-05 DIAGNOSIS — F1910 Other psychoactive substance abuse, uncomplicated: Secondary | ICD-10-CM | POA: Diagnosis present

## 2023-12-05 DIAGNOSIS — F1510 Other stimulant abuse, uncomplicated: Secondary | ICD-10-CM | POA: Diagnosis present

## 2023-12-05 DIAGNOSIS — G40509 Epileptic seizures related to external causes, not intractable, without status epilepticus: Secondary | ICD-10-CM | POA: Diagnosis present

## 2023-12-05 DIAGNOSIS — K760 Fatty (change of) liver, not elsewhere classified: Secondary | ICD-10-CM | POA: Diagnosis present

## 2023-12-05 DIAGNOSIS — Z5329 Procedure and treatment not carried out because of patient's decision for other reasons: Secondary | ICD-10-CM | POA: Diagnosis present

## 2023-12-05 DIAGNOSIS — Z91128 Patient's intentional underdosing of medication regimen for other reason: Secondary | ICD-10-CM | POA: Diagnosis not present

## 2023-12-05 DIAGNOSIS — T50996A Underdosing of other drugs, medicaments and biological substances, initial encounter: Secondary | ICD-10-CM | POA: Diagnosis present

## 2023-12-05 DIAGNOSIS — F10220 Alcohol dependence with intoxication, uncomplicated: Secondary | ICD-10-CM | POA: Diagnosis present

## 2023-12-05 DIAGNOSIS — Z7151 Drug abuse counseling and surveillance of drug abuser: Secondary | ICD-10-CM | POA: Diagnosis not present

## 2023-12-05 DIAGNOSIS — K861 Other chronic pancreatitis: Secondary | ICD-10-CM | POA: Diagnosis present

## 2023-12-05 LAB — CBC WITH DIFFERENTIAL/PLATELET
Abs Immature Granulocytes: 0.01 10*3/uL (ref 0.00–0.07)
Basophils Absolute: 0 10*3/uL (ref 0.0–0.1)
Basophils Relative: 1 %
Eosinophils Absolute: 0.2 10*3/uL (ref 0.0–0.5)
Eosinophils Relative: 4 %
HCT: 24.1 % — ABNORMAL LOW (ref 39.0–52.0)
Hemoglobin: 8.1 g/dL — ABNORMAL LOW (ref 13.0–17.0)
Immature Granulocytes: 0 %
Lymphocytes Relative: 40 %
Lymphs Abs: 2 10*3/uL (ref 0.7–4.0)
MCH: 30.6 pg (ref 26.0–34.0)
MCHC: 33.6 g/dL (ref 30.0–36.0)
MCV: 90.9 fL (ref 80.0–100.0)
Monocytes Absolute: 0.4 10*3/uL (ref 0.1–1.0)
Monocytes Relative: 9 %
Neutro Abs: 2.4 10*3/uL (ref 1.7–7.7)
Neutrophils Relative %: 46 %
Platelets: 179 10*3/uL (ref 150–400)
RBC: 2.65 MIL/uL — ABNORMAL LOW (ref 4.22–5.81)
RDW: 16.4 % — ABNORMAL HIGH (ref 11.5–15.5)
WBC: 5 10*3/uL (ref 4.0–10.5)
nRBC: 0 % (ref 0.0–0.2)

## 2023-12-05 LAB — CBC
HCT: 29.2 % — ABNORMAL LOW (ref 39.0–52.0)
HCT: 33 % — ABNORMAL LOW (ref 39.0–52.0)
Hemoglobin: 9.7 g/dL — ABNORMAL LOW (ref 13.0–17.0)
Hemoglobin: 10.4 g/dL — ABNORMAL LOW (ref 13.0–17.0)
MCH: 30.3 pg (ref 26.0–34.0)
MCH: 29.9 pg (ref 26.0–34.0)
MCHC: 33.2 g/dL (ref 30.0–36.0)
MCHC: 31.5 g/dL (ref 30.0–36.0)
MCV: 91.3 fL (ref 80.0–100.0)
MCV: 94.8 fL (ref 80.0–100.0)
Platelets: 143 10*3/uL — ABNORMAL LOW (ref 150–400)
Platelets: 148 10*3/uL — ABNORMAL LOW (ref 150–400)
RBC: 3.2 MIL/uL — ABNORMAL LOW (ref 4.22–5.81)
RBC: 3.48 MIL/uL — ABNORMAL LOW (ref 4.22–5.81)
RDW: 16.2 % — ABNORMAL HIGH (ref 11.5–15.5)
RDW: 16.1 % — ABNORMAL HIGH (ref 11.5–15.5)
WBC: 3.5 10*3/uL — ABNORMAL LOW (ref 4.0–10.5)
WBC: 3.5 10*3/uL — ABNORMAL LOW (ref 4.0–10.5)
nRBC: 0 % (ref 0.0–0.2)
nRBC: 0 % (ref 0.0–0.2)

## 2023-12-05 LAB — LIPASE, BLOOD: Lipase: 138 U/L — ABNORMAL HIGH (ref 11–51)

## 2023-12-05 LAB — RAPID URINE DRUG SCREEN, HOSP PERFORMED
Amphetamines: NOT DETECTED
Barbiturates: NOT DETECTED
Benzodiazepines: POSITIVE — AB
Cocaine: NOT DETECTED
Opiates: POSITIVE — AB
Tetrahydrocannabinol: NOT DETECTED

## 2023-12-05 LAB — COMPREHENSIVE METABOLIC PANEL
ALT: 16 U/L (ref 0–44)
AST: 21 U/L (ref 15–41)
Albumin: 3.5 g/dL (ref 3.5–5.0)
Alkaline Phosphatase: 46 U/L (ref 38–126)
Anion gap: 8 (ref 5–15)
BUN: 11 mg/dL (ref 6–20)
CO2: 25 mmol/L (ref 22–32)
Calcium: 8.2 mg/dL — ABNORMAL LOW (ref 8.9–10.3)
Chloride: 107 mmol/L (ref 98–111)
Creatinine, Ser: 0.72 mg/dL (ref 0.61–1.24)
GFR, Estimated: >60 mL/min (ref >60)
Glucose, Bld: 83 mg/dL (ref 70–99)
Potassium: 3.6 mmol/L (ref 3.5–5.1)
Sodium: 140 mmol/L (ref 135–145)
Total Bilirubin: 0.9 mg/dL (ref 0.0–1.2)
Total Protein: 6.3 g/dL — ABNORMAL LOW (ref 6.5–8.1)

## 2023-12-05 LAB — MAGNESIUM: Magnesium: 1.8 mg/dL (ref 1.7–2.4)

## 2023-12-05 LAB — IRON AND TIBC
Iron: 228 ug/dL — ABNORMAL HIGH (ref 45–182)
Saturation Ratios: 64 % — ABNORMAL HIGH (ref 17.9–39.5)
TIBC: 357 ug/dL (ref 250–450)
UIBC: 129 ug/dL

## 2023-12-05 LAB — FERRITIN: Ferritin: 35 ng/mL (ref 24–336)

## 2023-12-05 LAB — T4, FREE: Free T4: 0.9 ng/dL (ref 0.61–1.12)

## 2023-12-05 LAB — VITAMIN B12: Vitamin B-12: 367 pg/mL (ref 180–914)

## 2023-12-05 LAB — FOLATE: Folate: 19.7 ng/mL (ref >5.9)

## 2023-12-05 LAB — TSH: TSH: 8.994 u[IU]/mL — ABNORMAL HIGH (ref 0.350–4.500)

## 2023-12-05 MED ORDER — CHLORDIAZEPOXIDE HCL 25 MG PO CAPS
25.0000 mg | ORAL_CAPSULE | Freq: Four times a day (QID) | ORAL | Status: AC
  Administered 2023-12-05 (×3): 25 mg via ORAL
  Filled 2023-12-05 (×3): qty 1

## 2023-12-05 MED ORDER — PANTOPRAZOLE SODIUM 40 MG IV SOLR
40.0000 mg | Freq: Every day | INTRAVENOUS | Status: DC
  Administered 2023-12-05 – 2023-12-08 (×4): 40 mg via INTRAVENOUS
  Filled 2023-12-05 (×4): qty 10

## 2023-12-05 MED ORDER — CHLORDIAZEPOXIDE HCL 5 MG PO CAPS: 25.0000 mg | ORAL_CAPSULE | ORAL | Status: DC

## 2023-12-05 MED ORDER — THIAMINE HCL 100 MG/ML IJ SOLN
100.0000 mg | Freq: Every day | INTRAMUSCULAR | Status: AC
  Administered 2023-12-05: 100 mg via INTRAVENOUS
  Filled 2023-12-05: qty 2

## 2023-12-05 MED ORDER — IOHEXOL 350 MG/ML SOLN
75.0000 mL | Freq: Once | INTRAVENOUS | Status: AC | PRN
  Administered 2023-12-05: 75 mL via INTRAVENOUS

## 2023-12-05 MED ORDER — ACETAMINOPHEN 650 MG RE SUPP: 650.0000 mg | Freq: Four times a day (QID) | RECTAL | Status: DC | PRN

## 2023-12-05 MED ORDER — LACTATED RINGERS IV BOLUS
1000.0000 mL | Freq: Once | INTRAVENOUS | Status: AC
  Administered 2023-12-05: 1000 mL via INTRAVENOUS

## 2023-12-05 MED ORDER — POLYETHYLENE GLYCOL 3350 17 G PO PACK
17.0000 g | PACK | Freq: Every day | ORAL | Status: DC
  Administered 2023-12-05 – 2023-12-08 (×4): 17 g via ORAL
  Filled 2023-12-05 (×4): qty 1

## 2023-12-05 MED ORDER — HYDROMORPHONE HCL 1 MG/ML IJ SOLN
1.0000 mg | Freq: Once | INTRAMUSCULAR | Status: AC
Start: 2023-12-05 — End: 2023-12-05
  Administered 2023-12-05: 1 mg via INTRAVENOUS
  Filled 2023-12-05: qty 1

## 2023-12-05 MED ORDER — LACTATED RINGERS IV SOLN: INTRAVENOUS | Status: AC

## 2023-12-05 MED ORDER — CHLORDIAZEPOXIDE HCL 25 MG PO CAPS
25.0000 mg | ORAL_CAPSULE | Freq: Once | ORAL | Status: AC
  Administered 2023-12-05: 25 mg via ORAL
  Filled 2023-12-05: qty 1

## 2023-12-05 MED ORDER — MORPHINE SULFATE (PF) 2 MG/ML IV SOLN
2.0000 mg | INTRAVENOUS | Status: DC | PRN
Start: 2023-12-05 — End: 2023-12-06
  Administered 2023-12-05 – 2023-12-06 (×5): 2 mg via INTRAVENOUS
  Filled 2023-12-05 (×5): qty 1

## 2023-12-05 MED ORDER — CHLORDIAZEPOXIDE HCL 5 MG PO CAPS
25.0000 mg | ORAL_CAPSULE | Freq: Three times a day (TID) | ORAL | Status: DC
  Administered 2023-12-06 (×2): 25 mg via ORAL
  Filled 2023-12-05: qty 5
  Filled 2023-12-05: qty 1

## 2023-12-05 MED ORDER — LOPERAMIDE HCL 2 MG PO CAPS: 2.0000 mg | ORAL_CAPSULE | ORAL | Status: DC | PRN

## 2023-12-05 MED ORDER — ONDANSETRON HCL 4 MG/2ML IJ SOLN
4.0000 mg | Freq: Four times a day (QID) | INTRAMUSCULAR | Status: DC | PRN
  Administered 2023-12-06: 4 mg via INTRAVENOUS
  Filled 2023-12-05: qty 2

## 2023-12-05 MED ORDER — CHLORDIAZEPOXIDE HCL 5 MG PO CAPS: 25.0000 mg | ORAL_CAPSULE | Freq: Four times a day (QID) | ORAL | Status: DC | PRN

## 2023-12-05 MED ORDER — THIAMINE MONONITRATE 100 MG PO TABS
100.0000 mg | ORAL_TABLET | Freq: Every day | ORAL | Status: DC
Start: 2023-12-06 — End: 2023-12-08
  Administered 2023-12-06 – 2023-12-08 (×3): 100 mg via ORAL
  Filled 2023-12-05 (×3): qty 1

## 2023-12-05 MED ORDER — ONDANSETRON HCL 4 MG PO TABS: 4.0000 mg | ORAL_TABLET | Freq: Four times a day (QID) | ORAL | Status: DC | PRN

## 2023-12-05 MED ORDER — ACETAMINOPHEN 325 MG PO TABS: 650.0000 mg | ORAL_TABLET | Freq: Four times a day (QID) | ORAL | Status: DC | PRN

## 2023-12-05 MED ORDER — CHLORDIAZEPOXIDE HCL 5 MG PO CAPS: 25.0000 mg | ORAL_CAPSULE | Freq: Every day | ORAL | Status: DC

## 2023-12-05 MED ORDER — HYDROXYZINE HCL 25 MG PO TABS: 25.0000 mg | ORAL_TABLET | Freq: Four times a day (QID) | ORAL | Status: DC | PRN

## 2023-12-05 NOTE — Assessment & Plan Note (Addendum)
38 year old presenting with one day history of severe epigastric pain in setting of alcohol abuse found to have acute alcoholic pancreatitis by findings on CT abdomen and elevated lipase.  -admit to progressive -NPO except for ice chips/meds>clear liquids  -continue IVF -pain medication -CT abdomen/pelvis: persistent inflammatory stranding about the head and uncinate process of the pancreas with small amount of peripancreatic fluid, but improved from previous CT imaging. 1.2cm pseudocyst, previously 1.4cm. No focal necrosis. No indication for abx.  -trend lipase -imperative that he stops drinking

## 2023-12-05 NOTE — Assessment & Plan Note (Addendum)
Not taking his keppra daily, but tells me he will take this Likely alcohol induced Re -initiate keppra  Seizure precautions

## 2023-12-05 NOTE — Assessment & Plan Note (Signed)
UDS pending, hx of meth use

## 2023-12-05 NOTE — ED Notes (Signed)
Spoke to Dr. Artis Flock via phone call about hypotension and continued tremors. Plan to hold additional ativan, give fluid bolus, draw blood. See orders.

## 2023-12-05 NOTE — Assessment & Plan Note (Signed)
Place in progressive with seizure precautions  Start librium taper and CIWA protocol  MV, thiamine and folic acid  Completed librium taper on 1/15-1/22 He states he wants to stop drinking, but once sober his anxiety is severe like in state of withdrawal.  Consider psych consult

## 2023-12-05 NOTE — Assessment & Plan Note (Signed)
Likely opioid induced and poor fiber intake  Continue IVF, bowel regimen

## 2023-12-05 NOTE — Assessment & Plan Note (Signed)
Declines nicotine patch

## 2023-12-05 NOTE — H&P (Signed)
History and Physical    Patient: Gary [name] ZDG:[id number] DOB: 1986-04-02 DOA: 12/04/2023 DOS: the patient was seen and examined on 12/05/2023 PCP: Patient, No Pcp Per  Patient coming from: Home - homeless    Chief Complaint: "pancreatitis"   HPI: Gary [name] is a 38 y.o. male with medical history significant of alcohol abuse, substance abuse, noncompliant HIV comes to ED  Admitted from 1/15/-1/22 for acute alcoholic pancreatitis. Completed librium taper.  Tells me he drank a bottle of wine yesterday and started to have stomach pain so called EMS. HE typically drinks 1/5 or bottle of vodka daily. He did not have this yesterday, but has drank this must about 3 times his hospital discharge. Does want to stop drinking, but has such anxiety when he is sober. He denies any fever or chills. States his stomach pain is in stomach area and rated as a 10/10 and constant. Some radiation into RUQ. He has tried to avoid fatty foods, but states he gets hungry and eats. He also states he has been very constipated. No N/V/D.     Denies any fever/chills, vision changes/headaches, chest pain or palpitations, shortness of breath or cough,  N/V/D, dysuria or leg swelling.    He does smoke, alcohol use above and does Meth from time to time.   ER Course:  vitals: afebrile, bp: 95/54, HR: 109, RR: 16, oxygen: 100%RA Pertinent labs: lipase: 391,  hgb: 8.1 CT abdomen: Persistent inflammatory stranding about the head and uncinate process of the pancreas with small amount of peripancreatic fluid but less fluid than previously. 2. 1.2 cm pseudocyst in the pancreatic head and neck junction, previously 1.4 cm. 3. No mass enhancement or focal necrosis. Truncated pancreatic tail. 4. Chronic thickened folds in the duodenum likely reactive. 5. Constipation. 6. Mild hepatic steatosis.  Mild splenomegaly. 7. Two-vessel coronary calcific plaque, unusual in a 38 year old and age-advanced. Consider risk factor  modification unless already being done. 8. Enlarged prostate. 9. 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 In ED: started on CIWA protocol, given 1L IVF, pain medication.      Review of Systems: As mentioned in the history of present illness. All other systems reviewed and are negative. Past Medical History:  Diagnosis Date   Alcohol abuse    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   Anxiety    Bipolar 2 disorder (HCC)    HIV (human immunodeficiency virus infection) (HCC)    Pancreatitis    PTSD (post-traumatic stress disorder)    Schizophrenia (HCC)    Seizures (HCC)    Subdural hematoma (HCC)    Past Surgical History:  Procedure Laterality Date   BIOPSY  04/19/2022   Procedure: BIOPSY;  Surgeon: Lemar Lofty., MD;  Location[facility] END[facility]COPY;  Service: Gastroenterology;;   ENTEROSCOPY N/A 04/19/2022   Procedure: ENTEROSCOPY;  Surgeon: Lemar Lofty., MD;  Location: [facility] ENDOSCOPY;  Service: Gastroenterology;  Laterality: N/A;   INCISION AND DRAINAGE PERIRECTAL ABSCESS N/A 09/24/2016   Procedure: IRRIGATION AND DEBRIDEMENT PERIRECTAL ABSCESS;  Surgeon: Ricarda Frame, MD;  Location: [facility] ORS;  Service: General;  Laterality: N/A;   none     Social History:  reports that he has been smoking cigarettes. He started smoking about 21 years ago. He has a 10.5 pack-year smoking history. He has never used smokeless tobacco. He reports current alcohol use of about 105.0 standard drinks of alcohol per week. He reports current drug use. Drugs: Methamphetamines and Cocaine.  Allergies  Allergen Reactions  Tegretol [Carbamazepine] Other (See Comments)    Vertigo and paralysis   Caffeine Palpitations    Family History  Problem Relation Age of Onset   Alcohol abuse Mother    Alcohol abuse Father    Colon cancer Other    Other Other    Cancer Other     Prior to Admission medications   Medication Sig Start Date End  Date Taking? Authorizing Provider  bictegravir-emtricitabine-tenofovir AF (BIKTARVY) 50-200-25 MG TABS tablet Take 1 tablet by mouth daily. Patient not taking: Reported on 11/25/2023 11/16/23   Briant Cedar, MD  bisacodyl (DULCOLAX) 5 MG EC tablet Take 1 tablet (5 mg total) by mouth daily as needed for moderate constipation. 12/01/23   Amin, Ankit C, MD  levETIRAcetam (KEPPRA) 500 MG tablet Take 1 tablet (500 mg total) by mouth 2 (two) times daily. Patient not taking: Reported on 11/25/2023 11/18/23   Gunnar Bulla, MD  ondansetron (ZOFRAN-ODT) 4 MG disintegrating tablet Dissolve 1 tablet (4 mg total) by mouth every 8 (eight) hours as needed for nausea or vomiting. 12/01/23   Amin, Ankit C, MD  ondansetron (ZOFRAN-ODT) 4 MG disintegrating tablet Dissolve 1 tablet (4 mg total) by mouth every 8 (eight) hours as needed for nausea or vomiting. 12/01/23   Amin, Ankit C, MD  oxyCODONE (OXY IR/ROXICODONE) 5 MG immediate release tablet Take 1 tablet (5 mg total) by mouth every 6 (six) hours as needed for severe pain (pain score 7-10) or moderate pain (pain score 4-6). 12/01/23   Amin, Ankit C, MD  thiamine (VITAMIN B-1) 100 MG tablet Take 100 mg by mouth daily. Patient not taking: Reported on 11/25/2023    [provider]  famotidine (PEPCID) 20 MG tablet Take 1 tablet (20 mg total) by mouth 2 (two) times daily. Patient not taking: Reported on 06/01/2019 05/14/19 06/02/19  Benjiman Core, MD    Physical Exam: Vitals:   12/05/23 0842 12/05/23 1110 12/05/23 1209 12/05/23 1211  BP: (!) 91/43 (!) 95/56 93/64   Pulse: 88 76 60   Resp: 18  15   Temp: 98.6 F (37 C)   99 F (37.2 C)  TempSrc: Oral   Oral  SpO2: 100%  100%   Weight:      Height:       General:  Appears calm and comfortable and is in NAD. Thin, shaking  Eyes:  PERRL, EOMI, normal lids, iris ENT:  grossly normal hearing, lips & tongue, mmm; appropriate dentition Neck:  no LAD, masses or thyromegaly; no carotid  bruits Cardiovascular:  RRR, no m/r/g. No LE edema.  Respiratory:   CTA bilaterally with no wheezes/rales/rhonchi.  Normal respiratory effort. Abdomen:  soft, TTP in epigastric area, ND, NABS Back:   normal alignment, no CVAT Skin:  no rash or induration seen on limited exam Musculoskeletal:  grossly normal tone BUE/BLE, good ROM, no bony abnormality Lower extremity:  No LE edema.  Limited foot exam with no ulcerations.  2+ distal pulses. Psychiatric:  grossly normal mood and affect, speech fluent and appropriate, AOx3 Neurologic:  CN 2-12 grossly intact, moves all extremities in coordinated fashion, sensation intact   Radiological Exams on Admission: Independently reviewed - see discussion in A/P where applicable  CT ABDOMEN PELVIS W CONTRAST Result Date: 12/05/2023 CLINICAL DATA:  Acute severe pancreatitis with abdominal pain. EXAM: CT ABDOMEN AND PELVIS WITH CONTRAST TECHNIQUE: Multidetector CT imaging of the abdomen and pelvis was performed using the standard protocol following bolus administration of intravenous contrast. RADIATION DOSE REDUCTION:  This exam was performed according to the departmental dose-optimization program which includes automated exposure control, adjustment of the mA and/or kV according to patient size and/or use of iterative reconstruction technique. CONTRAST:  75mL OMNIPAQUE IOHEXOL 350 MG/ML SOLN COMPARISON:  Multiple prior CTs since 2017. The 2 most recent are both with contrast and dated 11/25/2023 and 11/18/2023. 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 Stable appearance of overlying atelectasis in the left lower lobe. Lung bases are otherwise clear. The cardiac size is normal. There is scattered two-vessel coronary calcific plaque in the LAD and right coronary arteries but this is unusual in a 38 year old and age-advanced. Consider risk factor modification unless already being done.  Hepatobiliary: No focal liver abnormality is seen. No gallstones, gallbladder wall thickening, or biliary dilatation. The liver is mildly steatotic. Pancreas: Small pancreas with truncated tail section. Inflammatory stranding continues to be seen about the head and uncinate process with small amount of peripancreatic fluid in the area but less fluid than previously. Again noted is a 1.2 cm pseudocyst, previously 1.4 cm in the pancreatic head and neck junction. No other localizing collection is seen. The overall appearance is similar to 11/18/2023, as above with decreased peripancreatic fluid since 11/24/2018. There is no mass enhancement.  No focal necrosis is seen. Spleen: Mildly prominent, measures 14 cm length. No mass enhancement. Adrenals/Urinary Tract: There is no adrenal mass. 8 mm Bosniak 2 cyst in the inferior pole of the right kidney is too small to characterize but unchanged. Both kidneys are otherwise homogeneous. There is no urinary stone or obstruction. The bladder is unremarkable. Stomach/Bowel: There are chronic thickened folds in the duodenum likely reactive. No bowel obstruction or inflammation. No evidence of appendicitis. Moderate fecal stasis. Vascular/Lymphatic: No significant vascular findings are present. No enlarged abdominal or pelvic lymph nodes. Reproductive: Enlarged prostate 5.1 cm transverse, also unusual at this age. Other: No pelvic free fluid. No free hemorrhage, free air or abscess. No incarcerated hernia. Musculoskeletal: Degenerative disc disease and endplate irregularities of the lower thoracic spine. Transitional anatomy lumbosacral junction. No acute or other significant osseous findings. IMPRESSION: 1. Persistent inflammatory stranding about the head and uncinate process of the pancreas with small amount of peripancreatic fluid but less fluid than previously. 2. 1.2 cm pseudocyst in the pancreatic head and neck junction, previously 1.4 cm. 3. No mass enhancement or focal  necrosis. Truncated pancreatic tail. 4. Chronic thickened folds in the duodenum likely reactive. 5. Constipation. 6. Mild hepatic steatosis.  Mild splenomegaly. 7. Two-vessel coronary calcific plaque, unusual in a 38 year old and age-advanced. Consider risk factor modification unless already being done. 8. Enlarged prostate. 9. 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 Electronically Signed   By: Almira Bar M.D.   On: 12/05/2023 05:42    EKG: NSR, rate of 74  Labs on Admission: I have personally reviewed the available labs and imaging studies at the time of the admission.  Pertinent labs:   Lipase: 391,  hgb: 8.1  Assessment and Plan: Principal Problem:   Alcohol induced acute pancreatitis Active Problems:   Acute on chronic anemia   Alcohol use disorder, severe, dependence (HCC)   HIV (human immunodeficiency virus infection) (HCC)   Seizure (HCC)   Polysubstance abuse (HCC)   Nicotine dependence, cigarettes, uncomplicated   Constipation    Assessment and Plan: * Alcohol induced acute pancreatitis 39 year old presenting with one day history of severe epigastric pain in  setting of alcohol abuse found to have acute alcoholic pancreatitis by findings on CT abdomen and elevated lipase.  -admit to progressive -NPO except for ice chips/meds>clear liquids  -continue IVF -pain medication -CT abdomen/pelvis: persistent inflammatory stranding about the head and uncinate process of the pancreas with small amount of peripancreatic fluid, but improved from previous CT imaging. 1.2cm pseudocyst, previously 1.4cm. No focal necrosis. No indication for abx.  -trend lipase -imperative that he stops drinking   Acute on chronic anemia Hgb 8.1 on admit (was previously 10.7 on 1/22)  States he has been constipated with some blood with straining, but no other bleeding  -stat repeat of hgb 9.7 -fecal occult ordered -iron studies pending -started  protonix 40mg  q 24 hours -serial CBC   Alcohol use disorder, severe, dependence (HCC) Place in progressive with seizure precautions  Start librium taper and CIWA protocol  MV, thiamine and folic acid  Completed librium taper on 1/15-1/22 He states he wants to stop drinking, but once sober his anxiety is severe like in state of withdrawal.  Consider psych consult   HIV (human immunodeficiency virus infection) (HCC) Follows at [facility]. Non complaint,  Not taking his Susanne Borders he tells me it was held as thought to cause his pancreatitis.  Discussed needs to f/u with his ID physician   Seizure [facility]) Not taking his keppra daily, but tells me he will take this Likely alcohol induced Re -initiate keppra  Seizure precautions   Polysubstance abuse (HCC) UDS pending, hx of meth use   Nicotine dependence, cigarettes, uncomplicated Declines nicotine patch   Constipation Likely opioid induced and poor fiber intake  Continue IVF, bowel regimen     Advance Care Planning:   Code Status: Full Code   Consults: SW   DVT Prophylaxis: SCDs  Family Communication: none   Severity of Illness: The appropriate patient status for this patient is INPATIENT. Inpatient status is judged to be reasonable and necessary in order to provide the required intensity of service to ensure the patient's safety. The patient's presenting symptoms, physical exam findings, and initial radiographic and laboratory data in the context of their chronic comorbidities is felt to place them at high risk for further clinical deterioration. Furthermore, it is not anticipated that the patient will be medically stable for discharge from the hospital within 2 midnights of admission.   * I certify that at the point of admission it is my clinical judgment that the patient will require inpatient hospital care spanning beyond 2 midnights from the point of admission due to high intensity of service, high risk for further deterioration  and high frequency of surveillance required.*  Author: Orland Mustard, MD 12/05/2023 12:47 PM  For on call review [email or web address].

## 2023-12-05 NOTE — ED Notes (Signed)
Patient transported to CT

## 2023-12-05 NOTE — Assessment & Plan Note (Signed)
Hgb 8.1 on admit (was previously 10.7 on 1/22)  States he has been constipated with some blood with straining, but no other bleeding  -stat repeat of hgb 9.7 -fecal occult ordered -iron studies pending -started protonix 40mg  q 24 hours -serial CBC

## 2023-12-05 NOTE — Assessment & Plan Note (Addendum)
Follows at [facility]. Non complaint,  Not taking his Susanne Borders he tells me it was held as thought to cause his pancreatitis.  Discussed needs to f/u with his ID physician

## 2023-12-06 ENCOUNTER — Encounter (HOSPITAL_COMMUNITY): Payer: Self-pay | Admitting: Family Medicine

## 2023-12-06 LAB — COMPREHENSIVE METABOLIC PANEL
ALT: 14 U/L (ref 0–44)
AST: 18 U/L (ref 15–41)
Albumin: 2.9 g/dL — ABNORMAL LOW (ref 3.5–5.0)
Alkaline Phosphatase: 44 U/L (ref 38–126)
Anion gap: 6 (ref 5–15)
BUN: <5 mg/dL — ABNORMAL LOW (ref 6–20)
CO2: 24 mmol/L (ref 22–32)
Calcium: 8.4 mg/dL — ABNORMAL LOW (ref 8.9–10.3)
Chloride: 103 mmol/L (ref 98–111)
Creatinine, Ser: 0.59 mg/dL — ABNORMAL LOW (ref 0.61–1.24)
GFR, Estimated: >60 mL/min (ref >60)
Glucose, Bld: 91 mg/dL (ref 70–99)
Potassium: 3.2 mmol/L — ABNORMAL LOW (ref 3.5–5.1)
Sodium: 137 mmol/L (ref 135–145)
Total Bilirubin: 1.4 mg/dL — ABNORMAL HIGH (ref 0.0–1.2)
Total Protein: 5.6 g/dL — ABNORMAL LOW (ref 6.5–8.1)

## 2023-12-06 LAB — LIPASE, BLOOD: Lipase: 82 U/L — ABNORMAL HIGH (ref 11–51)

## 2023-12-06 LAB — CBC
HCT: 31.6 % — ABNORMAL LOW (ref 39.0–52.0)
Hemoglobin: 10.4 g/dL — ABNORMAL LOW (ref 13.0–17.0)
MCH: 30 pg (ref 26.0–34.0)
MCHC: 32.9 g/dL (ref 30.0–36.0)
MCV: 91.1 fL (ref 80.0–100.0)
Platelets: 140 10*3/uL — ABNORMAL LOW (ref 150–400)
RBC: 3.47 MIL/uL — ABNORMAL LOW (ref 4.22–5.81)
RDW: 15.5 % (ref 11.5–15.5)
WBC: 3.2 10*3/uL — ABNORMAL LOW (ref 4.0–10.5)
nRBC: 0 % (ref 0.0–0.2)

## 2023-12-06 LAB — MRSA NEXT GEN BY PCR, NASAL: MRSA by PCR Next Gen: DETECTED — AB

## 2023-12-06 MED ORDER — BISACODYL 5 MG PO TBEC: 5.0000 mg | DELAYED_RELEASE_TABLET | Freq: Every day | ORAL | Status: DC | PRN

## 2023-12-06 MED ORDER — OXYCODONE HCL 5 MG PO TABS
5.0000 mg | ORAL_TABLET | ORAL | Status: DC | PRN
  Administered 2023-12-06 – 2023-12-07 (×3): 5 mg via ORAL
  Filled 2023-12-06 (×3): qty 1

## 2023-12-06 MED ORDER — LEVETIRACETAM 500 MG PO TABS
500.0000 mg | ORAL_TABLET | Freq: Two times a day (BID) | ORAL | Status: DC
  Administered 2023-12-06 – 2023-12-08 (×5): 500 mg via ORAL
  Filled 2023-12-06 (×5): qty 1

## 2023-12-06 NOTE — Progress Notes (Signed)
CSW added substance abuse resources to patient's AVS.  Edwin Dada, MSW, LCSW Transitions of Care  Clinical Social Worker II [phone]

## 2023-12-06 NOTE — Discharge Instructions (Signed)
Outpatient Substance Abuse  Treatment- uninsured  Narcotics Anonymous 24-HOUR HELPLINE Pre-recorded for Meeting Schedules [facility] [phone]  [email or web address] ALCOHOLICS ANONYMOUS  [facility]  Answering Service [phone] Please Note: All [place] Meetings are Non-smoking [email or web address]  Alcohol and Drug Services -  Insurance: Medicaid /State funding/private insurance Methadone, suboxone/Intensive outpatient  [place] [phone] Fax: [phone] [address] [place], [place], [address] [place] [phone] Fax: [phone]    [address] [place], [place], [address] ([address] [place], [place], [place], [place], [place], [place], [place], [place]) [facility] [email or web address] Accepts State funding/Medicaid Transitional housing, Intensive Outpatient Treatment, Outpatient treatment, Veterans Services  Phone: [phone] Fax: [phone] Address: [address] [place] [address]   [employer] of Care ([email or web address]/) Insurance: Medicaid Case Management, Administrator, arts, Medication Management, Outpatient Therapy, Psychosocial Rehabilitation, Substance Abuse Intensive Outpatient  Phone: [phone] Fax: [phone] [address], [place], [place], [address]  [facility] Medicaid, most private insurance providers Types of Program: Individual/Group Therapy, Substance Abuse Treatment  Phone: [place] [phone] Fax: [phone] [address], [place], [place], [address] [place] [phone] [address] [place], [place], [address]  [facility]  Medicaid Types of Program: SAIOP  Phone: [phone] Fax: [phone] [address] [place], [place], [place], [address] [facility] Medicaid/state funds Crisis line [phone] [facility] [phone] [place] [phone] [place] [phone]  [facility] [address];  [place], [place] [address] [phone]  Substance Abuse Intensive Outpatient Program [facility] [address] [place], [place] [address] [phone] Substance abuse treatment  [facility]  Insurance: [facility], [address], sliding scale Types of Program: substance abuse treatment, transportation assistance Phone: [phone] Fax: [phone] Address: [address], [place] [address] [facility] ([email or web address]) Insurance: UHC, [place], [place], [place] Program: addiction counseling, detoxification,  Phone: [phone]  Fax: [phone] Address: [address] [place], [place] [address]  [facility] (statewide facilities/programs) [address] (Medicaid/state funds) [place], [place] [address]                      [email or web address] [phone] Marcy Panning- [phone] [place]- [phone] [facility] [place] (2 Locations) (Medicaid/state funds) --[address]  walk in 8:30-12 and 1-2:30 [place], [id number]   [facility]- [phone] --[address] [place], [place] [address]  [phone] walk in 8:30-12 and 2-3:30  [facility] state funds/medicaid [address] [place], [place] [address] [phone] [facility] (Suboxone clinic) Medicaid/state funds  [address]  [place], [place] [address] [phone]   [facility]  [address], [place], [place] [address]  [phone] (24 hours) [place]- [address] [place] [address] [place], [place] [address]  [phone] (24 hours) [place]- [address] [place] [phone] [place]- [address] Rosalita Levan [phone] Turner Daniels [address] [place] [phone] [facility]- Medicaid and state funds  [place]- [address] [place], [place] [address] [phone] (24 hours) [place]- [address] [place], [place]  [address] [phone] [facility]- [address] [place], [place] [address] [phone] (24 hours) [place] [address] [place], [place]  [address] [phone] [place]- [address] [place] [phone]

## 2023-12-06 NOTE — Progress Notes (Addendum)
PROGRESS NOTE    Gary [name]  ZOX:[id number] DOB: 01/17/1986 DOA: 12/04/2023 PCP: Patient, No Pcp Per     Brief Narrative:   From admission h and p  Gary [name] is a 38 y.o. male with medical history significant of alcohol abuse, substance abuse, noncompliant HIV comes to ED  Admitted from 1/15/-1/22 for acute alcoholic pancreatitis. Completed librium taper.  Tells me he drank a bottle of wine yesterday and started to have stomach pain so called EMS. HE typically drinks 1/5 or bottle of vodka daily. He did not have this yesterday, but has drank this must about 3 times his hospital discharge. Does want to stop drinking, but has such anxiety when he is sober. He denies any fever or chills. States his stomach pain is in stomach area and rated as a 10/10 and constant. Some radiation into RUQ. He has tried to avoid fatty foods, but states he gets hungry and eats. He also states he has been very constipated. No N/V/D.   Assessment & Plan:   Principal Problem:   Alcohol induced acute pancreatitis Active Problems:   Homelessness   Acute on chronic anemia   Alcohol use disorder, severe, dependence (HCC)   HIV (human immunodeficiency virus infection) (HCC)   Seizure (HCC)   Polysubstance abuse (HCC)   Nicotine dependence, cigarettes, uncomplicated   Constipation  # Acute on chronic pancreatitis Ct shows stable pseudocyst, no signs necrosis or other acute complication. Today tolerating liquids. Reviewed case w/ Dr. Myrtie Neither today of GI who reports nothing additional to offer beyond routine treatment of pancreatitis and addressing the underlying issues. - will advance diet to full liquids - continue IVF - consider addition of creon at discharge  # Alcohol abuse No signs withdrawal today - continue librium taper  # GI bleed concern Initial hgb in the 8s but since then 10s and stable from priors, patient denies bleeding - monitor  # Seizure disorder - continue keppra  # HIV Not  compliant with antiretrovirals, not currently on meds - needs to re-establish with ID  # Homeless - TOC consulted  # Polysubstance abuse History methamphetamine abuse. Rpr, hbv, hcv negative 03/11/23 - will repeat hcv, hbv, rpr   DVT prophylaxis: lovenox Code Status: full Family Communication: none at bedside  Level of care: Progressive Status is: Inpatient Remains inpatient appropriate because: severity of illness    Consultants:  none  Procedures: none  Antimicrobials:  none    Subjective: Reports stable abd pain, tolerating clear liquids  Objective: Vitals:   12/06/23 0810 12/06/23 1016 12/06/23 1219 12/06/23 1238  BP: 100/77 96/62  98/65  Pulse: 60 (!) 54  64  Resp: 16 16  11   Temp: 98.2 F (36.8 C)  98.1 F (36.7 C)   TempSrc: Oral  Oral   SpO2: 100% 100%  100%  Weight:      Height:        Intake/Output Summary (Last 24 hours) at 12/06/2023 1313 Last data filed at 12/06/2023 1243 Gross per 24 hour  Intake 1600.71 ml  Output 1900 ml  Net -299.29 ml   Filed Weights   12/04/23 2233  Weight: 54.4 kg    Examination:  General exam: Appears calm and comfortable, chronically ill appearing Respiratory system: Clear to auscultation. Respiratory effort normal. Cardiovascular system: S1 & S2 heard, RRR. No JVD, murmurs, rubs, gallops or clicks. No pedal edema. Gastrointestinal system: Abdomen is nondistended, soft and tender epigastrum Central nervous system: Alert and oriented. No focal neurological  deficits. Extremities: Symmetric 5 x 5 power. Skin: No rashes, lesions or ulcers Psychiatry: Judgement and insight appear normal. Mood & affect appropriate.     Data Reviewed: I have personally reviewed following labs and imaging studies  CBC: Recent Labs  Lab 12/01/23 0502 12/05/23 0221 12/05/23 0748 12/05/23 1355 12/06/23 0536  WBC 4.2 5.0 3.5* 3.5* 3.2*  NEUTROABS  --  2.4  --   --   --   HGB 10.7* 8.1* 9.7* 10.4* 10.4*  HCT 32.6* 24.1* 29.2*  33.0* 31.6*  MCV 93.1 90.9 91.3 94.8 91.1  PLT 155 179 143* 148* 140*   Basic Metabolic Panel: Recent Labs  Lab 11/30/23 0502 12/01/23 0502 12/04/23 2312 12/05/23 0748 12/06/23 0536  NA 134* 133* 140 140 137  K 3.6 3.8 4.2 3.6 3.2*  CL 104 101 102 107 103  CO2 24 27 21* 25 24  GLUCOSE 96 89 91 83 91  BUN 8 10 13 11  <5*  CREATININE 0.42* 0.39* 0.76 0.72 0.59*  CALCIUM 8.8* 8.9 9.0 8.2* 8.4*  MG 1.8  --   --  1.8  --    GFR: Estimated Creatinine Clearance: 97.3 mL/min (A) (by C-G formula based on SCr of 0.59 mg/dL (L)). Liver Function Tests: Recent Labs  Lab 11/30/23 0502 12/01/23 0502 12/04/23 2312 12/05/23 0748 12/06/23 0536  AST 15 14* 25 21 18   ALT 14 14 16 16 14   ALKPHOS 37* 35* 51 46 44  BILITOT 0.5 0.5 0.8 0.9 1.4*  PROT 6.3* 6.2* 7.1 6.3* 5.6*  ALBUMIN 3.3* 3.3* 4.0 3.5 2.9*   Recent Labs  Lab 12/04/23 2312 12/05/23 0748 12/06/23 0536  LIPASE 391* 138* 82*   No results for input(s): "AMMONIA" in the last 168 hours. Coagulation Profile: No results for input(s): "INR", "PROTIME" in the last 168 hours. Cardiac Enzymes: No results for input(s): "CKTOTAL", "CKMB", "CKMBINDEX", "TROPONINI" in the last 168 hours. BNP (last 3 results) No results for input(s): "PROBNP" in the last 8760 hours. HbA1C: No results for input(s): "HGBA1C" in the last 72 hours. CBG: No results for input(s): "GLUCAP" in the last 168 hours. Lipid Profile: No results for input(s): "CHOL", "HDL", "LDLCALC", "TRIG", "CHOLHDL", "LDLDIRECT" in the last 72 hours. Thyroid Function Tests: Recent Labs    12/05/23 0805 12/05/23 1355  TSH 8.994*  --   FREET4  --  0.90   Anemia Panel: Recent Labs    12/05/23 0748 12/05/23 0805  VITAMINB12  --  367  FOLATE  --  19.7  FERRITIN 35  --   TIBC 357  --   IRON 228*  --    Urine analysis:    Component Value Date/Time   COLORURINE YELLOW 11/25/2023 1309   APPEARANCEUR CLEAR 11/25/2023 1309   APPEARANCEUR Clear 11/15/2014 1755    LABSPEC 1.015 11/25/2023 1309   LABSPEC 1.002 11/15/2014 1755   PHURINE 5.0 11/25/2023 1309   GLUCOSEU NEGATIVE 11/25/2023 1309   GLUCOSEU Negative 11/15/2014 1755   HGBUR NEGATIVE 11/25/2023 1309   BILIRUBINUR NEGATIVE 11/25/2023 1309   BILIRUBINUR Negative 11/15/2014 1755   KETONESUR NEGATIVE 11/25/2023 1309   PROTEINUR NEGATIVE 11/25/2023 1309   NITRITE NEGATIVE 11/25/2023 1309   LEUKOCYTESUR NEGATIVE 11/25/2023 1309   LEUKOCYTESUR Negative 11/15/2014 1755   Sepsis Labs: @LABRCNTIP (procalcitonin:4,lacticidven:4)  )No results found for this or any previous visit (from the past 240 hours).       Radiology Studies: CT ABDOMEN PELVIS W CONTRAST Result Date: 12/05/2023 CLINICAL DATA:  Acute severe pancreatitis with abdominal pain.  EXAM: CT ABDOMEN AND PELVIS WITH CONTRAST TECHNIQUE: Multidetector CT imaging of the abdomen and pelvis was performed using the standard protocol following bolus administration of intravenous contrast. RADIATION DOSE REDUCTION: This exam was performed according to the departmental dose-optimization program which includes automated exposure control, adjustment of the mA and/or kV according to patient size and/or use of iterative reconstruction technique. CONTRAST:  75mL OMNIPAQUE IOHEXOL 350 MG/ML SOLN COMPARISON:  Multiple prior CTs since 2017. The 2 most recent are both with contrast and dated 11/25/2023 and 11/18/2023. 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 Stable appearance of overlying atelectasis in the left lower lobe. Lung bases are otherwise clear. The cardiac size is normal. There is scattered two-vessel coronary calcific plaque in the LAD and right coronary arteries but this is unusual in a 38 year old and age-advanced. Consider risk factor modification unless already being done. Hepatobiliary: No focal liver abnormality is seen. No gallstones, gallbladder wall thickening, or  biliary dilatation. The liver is mildly steatotic. Pancreas: Small pancreas with truncated tail section. Inflammatory stranding continues to be seen about the head and uncinate process with small amount of peripancreatic fluid in the area but less fluid than previously. Again noted is a 1.2 cm pseudocyst, previously 1.4 cm in the pancreatic head and neck junction. No other localizing collection is seen. The overall appearance is similar to 11/18/2023, as above with decreased peripancreatic fluid since 11/24/2018. There is no mass enhancement.  No focal necrosis is seen. Spleen: Mildly prominent, measures 14 cm length. No mass enhancement. Adrenals/Urinary Tract: There is no adrenal mass. 8 mm Bosniak 2 cyst in the inferior pole of the right kidney is too small to characterize but unchanged. Both kidneys are otherwise homogeneous. There is no urinary stone or obstruction. The bladder is unremarkable. Stomach/Bowel: There are chronic thickened folds in the duodenum likely reactive. No bowel obstruction or inflammation. No evidence of appendicitis. Moderate fecal stasis. Vascular/Lymphatic: No significant vascular findings are present. No enlarged abdominal or pelvic lymph nodes. Reproductive: Enlarged prostate 5.1 cm transverse, also unusual at this age. Other: No pelvic free fluid. No free hemorrhage, free air or abscess. No incarcerated hernia. Musculoskeletal: Degenerative disc disease and endplate irregularities of the lower thoracic spine. Transitional anatomy lumbosacral junction. No acute or other significant osseous findings. IMPRESSION: 1. Persistent inflammatory stranding about the head and uncinate process of the pancreas with small amount of peripancreatic fluid but less fluid than previously. 2. 1.2 cm pseudocyst in the pancreatic head and neck junction, previously 1.4 cm. 3. No mass enhancement or focal necrosis. Truncated pancreatic tail. 4. Chronic thickened folds in the duodenum likely reactive. 5.  Constipation. 6. Mild hepatic steatosis.  Mild splenomegaly. 7. Two-vessel coronary calcific plaque, unusual in a 38 year old and age-advanced. Consider risk factor modification unless already being done. 8. Enlarged prostate. 9. 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 Electronically Signed   By: Almira Bar M.D.   On: 12/05/2023 05:42        Scheduled Meds:  chlordiazePOXIDE  25 mg Oral TID   Followed by   Melene Muller ON 12/07/2023] chlordiazePOXIDE  25 mg Oral BH-qamhs   Followed by   Melene Muller ON 12/08/2023] chlordiazePOXIDE  25 mg Oral Daily   folic acid  1 mg Oral Daily   multivitamin with minerals  1 tablet Oral Daily   pantoprazole (PROTONIX) IV  40 mg Intravenous Daily   polyethylene glycol  17 g Oral Daily  thiamine  100 mg Oral Daily   Continuous Infusions:   LOS: 1 day     Silvano Bilis, MD [facility]   If 7PM-7AM, please contact night-coverage [email or web address] Password [email or web address] 12/06/2023, 1:13 PM

## 2023-12-06 NOTE — ED Notes (Signed)
Unit secretary notified of patient transport to floor.

## 2023-12-07 DIAGNOSIS — K852 Alcohol induced acute pancreatitis without necrosis or infection: Secondary | ICD-10-CM | POA: Diagnosis not present

## 2023-12-07 LAB — CBC WITH DIFFERENTIAL/PLATELET
Abs Immature Granulocytes: 0.03 10*3/uL (ref 0.00–0.07)
Basophils Absolute: 0 10*3/uL (ref 0.0–0.1)
Basophils Relative: 1 %
Eosinophils Absolute: 0.3 10*3/uL (ref 0.0–0.5)
Eosinophils Relative: 7 %
HCT: 31.3 % — ABNORMAL LOW (ref 39.0–52.0)
Hemoglobin: 10.6 g/dL — ABNORMAL LOW (ref 13.0–17.0)
Immature Granulocytes: 1 %
Lymphocytes Relative: 35 %
Lymphs Abs: 1.4 10*3/uL (ref 0.7–4.0)
MCH: 30.5 pg (ref 26.0–34.0)
MCHC: 33.9 g/dL (ref 30.0–36.0)
MCV: 90.2 fL (ref 80.0–100.0)
Monocytes Absolute: 0.3 10*3/uL (ref 0.1–1.0)
Monocytes Relative: 8 %
Neutro Abs: 1.8 10*3/uL (ref 1.7–7.7)
Neutrophils Relative %: 48 %
Platelets: 166 10*3/uL (ref 150–400)
RBC: 3.47 MIL/uL — ABNORMAL LOW (ref 4.22–5.81)
RDW: 15.3 % (ref 11.5–15.5)
WBC: 3.9 10*3/uL — ABNORMAL LOW (ref 4.0–10.5)
nRBC: 0 % (ref 0.0–0.2)

## 2023-12-07 LAB — COMPREHENSIVE METABOLIC PANEL
ALT: 15 U/L (ref 0–44)
AST: 19 U/L (ref 15–41)
Albumin: 3.3 g/dL — ABNORMAL LOW (ref 3.5–5.0)
Alkaline Phosphatase: 46 U/L (ref 38–126)
Anion gap: 9 (ref 5–15)
BUN: <5 mg/dL — ABNORMAL LOW (ref 6–20)
CO2: 26 mmol/L (ref 22–32)
Calcium: 9.1 mg/dL (ref 8.9–10.3)
Chloride: 104 mmol/L (ref 98–111)
Creatinine, Ser: 0.6 mg/dL — ABNORMAL LOW (ref 0.61–1.24)
GFR, Estimated: >60 mL/min (ref >60)
Glucose, Bld: 86 mg/dL (ref 70–99)
Potassium: 3.4 mmol/L — ABNORMAL LOW (ref 3.5–5.1)
Sodium: 139 mmol/L (ref 135–145)
Total Bilirubin: 0.8 mg/dL (ref 0.0–1.2)
Total Protein: 6.1 g/dL — ABNORMAL LOW (ref 6.5–8.1)

## 2023-12-07 LAB — PHOSPHORUS: Phosphorus: 4.6 mg/dL (ref 2.5–4.6)

## 2023-12-07 LAB — URINALYSIS, ROUTINE W REFLEX MICROSCOPIC
Bilirubin Urine: NEGATIVE
Glucose, UA: NEGATIVE mg/dL
Hgb urine dipstick: NEGATIVE
Ketones, ur: NEGATIVE mg/dL
Leukocytes,Ua: NEGATIVE
Nitrite: NEGATIVE
Protein, ur: NEGATIVE mg/dL
Specific Gravity, Urine: 1.012 (ref 1.005–1.030)
pH: 7 (ref 5.0–8.0)

## 2023-12-07 LAB — AMMONIA: Ammonia: 15 umol/L (ref 9–35)

## 2023-12-07 LAB — MAGNESIUM: Magnesium: 1.7 mg/dL (ref 1.7–2.4)

## 2023-12-07 LAB — LIPASE, BLOOD: Lipase: 107 U/L — ABNORMAL HIGH (ref 11–51)

## 2023-12-07 LAB — PROCALCITONIN: Procalcitonin: <0.1 ng/mL

## 2023-12-07 LAB — RPR: RPR Ser Ql: NONREACTIVE

## 2023-12-07 LAB — C-REACTIVE PROTEIN: CRP: <0.5 mg/dL (ref <1.0)

## 2023-12-07 LAB — HEPATITIS B SURFACE ANTIGEN: Hepatitis B Surface Ag: NONREACTIVE

## 2023-12-07 MED ORDER — CHLORHEXIDINE GLUCONATE CLOTH 2 % EX PADS: 6.0000 | MEDICATED_PAD | Freq: Every day | CUTANEOUS | Status: DC

## 2023-12-07 MED ORDER — ENOXAPARIN SODIUM 30 MG/0.3ML IJ SOSY
30.0000 mg | PREFILLED_SYRINGE | INTRAMUSCULAR | Status: DC
  Filled 2023-12-07: qty 0.3

## 2023-12-07 MED ORDER — MUPIROCIN 2 % EX OINT
1.0000 | TOPICAL_OINTMENT | Freq: Two times a day (BID) | CUTANEOUS | Status: DC
  Administered 2023-12-07 – 2023-12-08 (×4): 1 via NASAL
  Filled 2023-12-07 (×2): qty 22

## 2023-12-07 MED ORDER — HYDROMORPHONE HCL 1 MG/ML IJ SOLN
1.0000 mg | INTRAMUSCULAR | Status: DC | PRN
  Administered 2023-12-08 (×2): 1 mg via INTRAVENOUS
  Filled 2023-12-07 (×2): qty 1

## 2023-12-07 MED ORDER — LACTATED RINGERS IV BOLUS
1000.0000 mL | Freq: Once | INTRAVENOUS | Status: AC
  Administered 2023-12-07: 1000 mL via INTRAVENOUS

## 2023-12-07 MED ORDER — OXYCODONE HCL 5 MG PO TABS
10.0000 mg | ORAL_TABLET | ORAL | Status: DC | PRN
  Administered 2023-12-07 – 2023-12-08 (×6): 10 mg via ORAL
  Filled 2023-12-07 (×6): qty 2

## 2023-12-07 MED ORDER — LACTATED RINGERS IV SOLN: INTRAVENOUS | Status: DC

## 2023-12-07 MED ORDER — CHLORDIAZEPOXIDE HCL 5 MG PO CAPS
10.0000 mg | ORAL_CAPSULE | Freq: Three times a day (TID) | ORAL | Status: DC
  Administered 2023-12-07: 10 mg via ORAL
  Filled 2023-12-07: qty 2

## 2023-12-07 MED ORDER — HYDROMORPHONE HCL 1 MG/ML IJ SOLN
1.0000 mg | Freq: Once | INTRAMUSCULAR | Status: AC
  Administered 2023-12-07: 1 mg via INTRAVENOUS
  Filled 2023-12-07: qty 1

## 2023-12-07 MED ORDER — POTASSIUM CHLORIDE CRYS ER 20 MEQ PO TBCR
40.0000 meq | EXTENDED_RELEASE_TABLET | Freq: Once | ORAL | Status: AC
  Administered 2023-12-07: 40 meq via ORAL
  Filled 2023-12-07: qty 2

## 2023-12-07 MED ORDER — MAGNESIUM SULFATE 2 GM/50ML IV SOLN
2.0000 g | Freq: Once | INTRAVENOUS | Status: AC
  Administered 2023-12-07: 2 g via INTRAVENOUS
  Filled 2023-12-07: qty 50

## 2023-12-07 NOTE — TOC CM/SW Note (Signed)
Transition of Care [facility]) - Inpatient Brief Assessment   Patient Details  Name: Gary [name] MRN: [id number] Date of Birth: 1986/06/28  [facility]) CM/SW Contact:    Mearl Latin, LCSW Phone Number: 12/07/2023, 3:30 PM   Clinical Narrative: Patient admitted with recent discharge on 1/22, 1/14, 1/7, and 1/1 for the month of January. Patient with history of homelessness. Resources provided each admit. Medical workup ongoing.     Transition of Care Asessment: Insurance and Status: Insurance coverage has been reviewed Patient has primary care physician: No (Pt did not go to appointment) Home environment has been reviewed: Homeless Prior level of function:: Independent Prior/Current Home Services: No current home services Social Drivers of Health Review: SDOH reviewed interventions complete Readmission risk has been reviewed: Yes Transition of care needs: no transition of care needs at this time

## 2023-12-07 NOTE — Progress Notes (Addendum)
TR[facility] night cross cover note:   I was notified by RN that this patient, who is hospitalized with acute pancreatitis, is complaining of 10 out of 10 pain shortly after eating dinner this evening, and refractory to existing order for oxycodone IR 10 mg prn.  I subsequently ordered a one-time dose of IV Dilaudid to further address his discomfort.   Update: The patient reports return of his generalized abdominal discomfort, which was temporarily improved with the aforementioned one-time dose of IV Dilaudid.  He has just received a dose of his existing prn oxycodone IR 10 mg, but notes no significant improvement in his abdominal pain with the oxycodone.  Patient conveys that he feels that he has regressed in terms of his pain control, and that he had trouble tolerating dinner this evening from the standpoint.  I have subsequently placed a new order for Dilaudid 1 mg IV every 2 hours as needed.     Newton Pigg, DO Hospitalist

## 2023-12-07 NOTE — Progress Notes (Addendum)
PROGRESS NOTE                                                                                                                                                                                                             Patient Demographics:    Gary [name], is a 38 y.o. male, DOB - [birth year], VOH:[id number]  Outpatient Primary MD for the patient is Patient, No Pcp Per    LOS - 2  Admit date - 12/04/2023    Chief Complaint  Patient presents with   Abdominal Pain       Brief Narrative (HPI from H&P)   38 y.o. male with medical history significant of alcohol abuse, substance abuse, noncompliant HIV comes to ED  Admitted from 1/15/-1/22 for acute alcoholic pancreatitis   Subjective:    Gary [name] today has, No headache, No chest pain, +ve mild abdominal pain - No Nausea, No new weakness tingling or numbness, no SOB   Assessment  & Plan :    Alcohol induced acute pancreatitis 38 year old presenting with one day history of severe epigastric pain in setting of alcohol abuse found to have acute alcoholic pancreatitis by findings on CT abdomen and elevated lipase.  Counseled to quit alcohol, stable LFTs, CT scan abdomen pelvis does not show any CBD dilation or stones, triglycerides stable, continue supportive care with bowel rest, IV fluids, pain control and monitor.  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  Stable from that standpoint will definitely require outpatient GI follow-up for continued monitoring.    Acute on chronic anemia - Hgb 8.1 on admit (was previously 10.7 on 1/22), due to heme dilution no signs of bleeding.  Continue PPI and monitor CBC, anemia panel unremarkable.  Borderline B12.  PCP to monitor.  Alcohol use disorder, severe, dependence (HCC) Still to quit, and mild DTs, on Librium and CIWA protocol.  HIV (human immunodeficiency virus infection) (HCC) Follows at [facility]. Non complaint, not  compliant with his Biktarvy, also has pancreatitis.  Check CD4 count and viral load.  Counseled to follow-up with his ID clinic.  Seizure [facility]) Not taking his keppra daily, but tells me he will take this Likely alcohol induced Re -initiate keppra  Seizure precautions   Polysubstance abuse (HCC) UDS noted positive for benzos and opioids, counseled to quit all recreational drug abuse and  prescription drug abuse.  Nicotine dependence, cigarettes, uncomplicated Declines nicotine patch   Dehydration, hypokalemia, hypotension.  Hydrate with IV fluids, replace potassium.    Constipation Likely opioid induced and poor fiber intake  Continue IVF, bowel regimen         Condition -   Guarded  Family Communication  :  None  Code Status :  Full  Consults  :  None  PUD Prophylaxis : PPI   Procedures  :     CT - 1. Persistent inflammatory stranding about the head and uncinate process of the pancreas with small amount of peripancreatic fluid but less fluid than previously. 2. 1.2 cm pseudocyst in the pancreatic head and neck junction, previously 1.4 cm. 3. No mass enhancement or focal necrosis. Truncated pancreatic tail. 4. Chronic thickened folds in the duodenum likely reactive. 5. Constipation. 6. Mild hepatic steatosis.  Mild splenomegaly. 7. Two-vessel coronary calcific plaque, unusual in a 38 year old and age-advanced. Consider risk factor modification unless already being done. 8. Enlarged prostate. 9. 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 Yes; Fluid consistency: Thin  Diet effective now                    Inpatient Medications  Scheduled Meds:   chlordiazePOXIDE  10 mg Oral TID   Chlorhexidine Gluconate Cloth  6 each Topical Q0600   folic acid  1 mg Oral Daily   levETIRAcetam  500 mg Oral BID   multivitamin with minerals  1 tablet Oral Daily   mupirocin ointment  1 Application Nasal BID   pantoprazole (PROTONIX) IV  40 mg Intravenous Daily   polyethylene glycol  17 g Oral Daily   thiamine  100 mg Oral Daily   Continuous Infusions:  lactated ringers     magnesium sulfate bolus IVPB 2 g (12/07/23 0952)   PRN Meds:.acetaminophen **OR** acetaminophen, bisacodyl, hydrOXYzine, loperamide, LORazepam **OR** LORazepam, ondansetron **OR** ondansetron (ZOFRAN) IV, oxyCODONE  Antibiotics  :    Anti-infectives (From admission, onward)    None         Objective:   Vitals:   12/07/23 0400 12/07/23 0600 12/07/23 0820 12/07/23 0829  BP: (!) 91/54 98/70 92/63  92/63  Pulse: (!) 57 73 68 70  Resp:  13 14 15   Temp:   98.1 F (36.7 C)   TempSrc:   Oral   SpO2:  92% 97% 100%  Weight:      Height:        Wt Readings from Last 3 Encounters:  12/04/23 54.4 kg  12/03/23 60 kg  12/01/23 60.6 kg     Intake/Output Summary (Last 24 hours) at 12/07/2023 1022 Last data filed at 12/06/2023 1243 Gross per 24 hour  Intake --  Output 1200 ml  Net -1200 ml     Physical Exam  Awake Alert, No new F.N deficits, Normal affect [place].AT,PERRAL Supple Neck, No JVD,   Symmetrical Chest wall movement, Good air movement bilaterally, CTAB RRR,No Gallops,Rubs or new Murmurs,  +ve B.Sounds, Abd Soft, mild epigastric tenderness,   No Cyanosis,  Clubbing or edema        Data Review:    Recent Labs  Lab 12/05/23 0221 12/05/23 0748 12/05/23 1355 12/06/23 0536 12/07/23 0443  WBC 5.0 3.5* 3.5* 3.2* 3.9*  HGB 8.1* 9.7* 10.4* 10.4* 10.6*  HCT 24.1* 29.2* 33.0* 31.6* 31.3*  PLT 179 143* 148* 140* 166  MCV 90.9 91.3 94.8 91.1 90.2  MCH 30.6 30.3 29.9 30.0 30.5  MCHC 33.6 33.2 31.5 32.9 33.9  RDW 16.4* 16.2* 16.1* 15.5 15.3  LYMPHSABS 2.0   --   --   --  1.4  MONOABS 0.4  --   --   --  0.3  EOSABS 0.2  --   --   --  0.3  BASOSABS 0.0  --   --   --  0.0    Recent Labs  Lab 12/01/23 0502 12/04/23 2312 12/05/23 0748 12/05/23 0805 12/06/23 0536 12/07/23 0700  NA 133* 140 140  --  137 139  K 3.8 4.2 3.6  --  3.2* 3.4*  CL 101 102 107  --  103 104  CO2 27 21* 25  --  24 26  ANIONGAP 5 17* 8  --  6 9  GLUCOSE 89 91 83  --  91 86  BUN 10 13 11   --  <5* <5*  CREATININE 0.39* 0.76 0.72  --  0.59* 0.60*  AST 14* 25 21  --  18 19  ALT 14 16 16   --  14 15  ALKPHOS 35* 51 46  --  44 46  BILITOT 0.5 0.8 0.9  --  1.4* 0.8  ALBUMIN 3.3* 4.0 3.5  --  2.9* 3.3*  CRP  --   --   --   --   --  <0.5  TSH  --   --   --  8.994*  --   --   AMMONIA  --   --   --   --   --  15  MG  --   --  1.8  --   --  1.7  PHOS  --   --   --   --   --  4.6  CALCIUM 8.9 9.0 8.2*  --  8.4* 9.1      Recent Labs  Lab 12/01/23 0502 12/04/23 2312 12/05/23 0748 12/05/23 0805 12/06/23 0536 12/07/23 0700  CRP  --   --   --   --   --  <0.5  TSH  --   --   --  8.994*  --   --   AMMONIA  --   --   --   --   --  15  MG  --   --  1.8  --   --  1.7  CALCIUM 8.9 9.0 8.2*  --  8.4* 9.1    --------------------------------------------------------------------------------------------------------------- Lab Results  Component Value Date   CHOL 162 12/15/2022   HDL 79 12/15/2022   LDLCALC 73 12/15/2022   TRIG 94 10/10/2023   CHOLHDL 2.1 12/15/2022    Lab Results  Component Value Date   HGBA1C 5.0 05/25/2023   Recent Labs    12/05/23 0805 12/05/23 1355  TSH 8.994*  --   FREET4  --  0.90   Recent Labs    12/05/23 0748 12/05/23 0805  VITAMINB12  --  367  FOLATE  --  19.7  FERRITIN 35  --   TIBC 357  --   IRON 228*  --  Micro Results Recent Results (from the past 240 hours)  MRSA Next Gen by PCR, Nasal     Status: Abnormal   Collection Time: 12/06/23  9:06 PM   Specimen: Nasal Mucosa; Nasal Swab  Result Value Ref Range Status    MRSA by PCR Next Gen DETECTED (A) NOT DETECTED Final    Comment: RESULTS CALLED TO,READ BACK BY AND VERIFIED WITH RN Z.[email or web address] ON 12/06/23 AT 2350 BY NM (NOTE) The GeneXpert MRSA Assay (FDA approved for NASAL specimens only), is one component of a comprehensive MRSA colonization surveillance program. It is not intended to diagnose MRSA infection nor to guide or monitor treatment for MRSA infections. Test performance is not FDA approved in patients less than 41 years old. Performed at [facility], [address] [place], [place] [address]     Radiology Reports CT ABDOMEN PELVIS W CONTRAST Result Date: 12/05/2023 CLINICAL DATA:  Acute severe pancreatitis with abdominal pain. EXAM: CT ABDOMEN AND PELVIS WITH CONTRAST TECHNIQUE: Multidetector CT imaging of the abdomen and pelvis was performed using the standard protocol following bolus administration of intravenous contrast. RADIATION DOSE REDUCTION: This exam was performed according to the departmental dose-optimization program which includes automated exposure control, adjustment of the mA and/or kV according to patient size and/or use of iterative reconstruction technique. CONTRAST:  75mL OMNIPAQUE IOHEXOL 350 MG/ML SOLN COMPARISON:  Multiple prior CTs since 2017. The 2 most recent are both with contrast and dated 11/25/2023 and 11/18/2023. 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 Stable appearance of overlying atelectasis in the left lower lobe. Lung bases are otherwise clear. The cardiac size is normal. There is scattered two-vessel coronary calcific plaque in the LAD and [name] coronary arteries but this is unusual in a 38 year old and age-advanced. Consider risk factor modification unless already being done. Hepatobiliary: No focal liver abnormality is seen. No gallstones, gallbladder wall thickening, or biliary dilatation. The liver is mildly steatotic. Pancreas:  Small pancreas with truncated tail section. Inflammatory stranding continues to be seen about the head and uncinate process with small amount of peripancreatic fluid in the area but less fluid than previously. Again noted is a 1.2 cm pseudocyst, previously 1.4 cm in the pancreatic head and neck junction. No other localizing collection is seen. The overall appearance is similar to 11/18/2023, as above with decreased peripancreatic fluid since 11/24/2018. There is no mass enhancement.  No focal necrosis is seen. Spleen: Mildly prominent, measures 14 cm length. No mass enhancement. Adrenals/Urinary Tract: There is no adrenal mass. 8 mm Bosniak 2 cyst in the inferior pole of the [name] kidney is too small to characterize but unchanged. Both kidneys are otherwise homogeneous. There is no urinary stone or obstruction. The bladder is unremarkable. Stomach/Bowel: There are chronic thickened folds in the duodenum likely reactive. No bowel obstruction or inflammation. No evidence of appendicitis. Moderate fecal stasis. Vascular/Lymphatic: No significant vascular findings are present. No enlarged abdominal or pelvic lymph nodes. Reproductive: Enlarged prostate 5.1 cm transverse, also unusual at this age. Other: No pelvic free fluid. No free hemorrhage, free air or abscess. No incarcerated hernia. Musculoskeletal: Degenerative disc disease and endplate irregularities of the lower thoracic spine. Transitional anatomy lumbosacral junction. No acute or other significant osseous findings. IMPRESSION: 1. Persistent inflammatory stranding about the head and uncinate process of the pancreas with small amount of peripancreatic fluid but less fluid than previously. 2. 1.2 cm pseudocyst in the pancreatic head and neck junction, previously 1.4 cm.  3. No mass enhancement or focal necrosis. Truncated pancreatic tail. 4. Chronic thickened folds in the duodenum likely reactive. 5. Constipation. 6. Mild hepatic steatosis.  Mild splenomegaly. 7.  Two-vessel coronary calcific plaque, unusual in a 38 year old and age-advanced. Consider risk factor modification unless already being done. 8. Enlarged prostate. 9. 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 Electronically Signed   By: Almira Bar M.D.   On: 12/05/2023 05:42      Signature  -   Susa Raring M.D on 12/07/2023 at 10:22 AM   -  To page go to [email or web address]

## 2023-12-07 NOTE — Evaluation (Signed)
Physical Therapy Brief Evaluation and Discharge Note Patient Details Name: Gary [name] MRN: [id number] DOB: May 29, 1986 Today's Date: 12/07/2023   History of Present Illness  38 y.o. male with medical history significant of alcohol abuse, substance abuse, noncompliant HIV comes to ED   Admitted from 1/15/-1/22 for acute alcoholic pancreatitis  Clinical Impression  Pt presents with admitting diagnosis above. Pt today was able to ambulate in hallway independently despite being agitated about having to move. Per chart review it appears that pt was homeless PTA. Pt presents at or near baseline mobility. Pt has no further acute PT needs and will be signing off. Re consult PT if mobility status changes. Pt would benefit from continued mobility with mobility specialist during acute stay.        PT Assessment Patient does not need any further PT services  Assistance Needed at Discharge  PRN    Equipment Recommendations None recommended by PT  Recommendations for Other Services       Precautions/Restrictions Precautions Precautions: Fall Restrictions Weight Bearing Restrictions Per Provider Order: No        Mobility  Bed Mobility   Supine/Sidelying to sit: Modified independent (Device/Increased time) Sit to supine/sidelying: Modified independent (Device/Increased time)    Transfers Overall transfer level: Independent Equipment used: None                    Ambulation/Gait Ambulation/Gait assistance: Independent Gait Distance (Feet): 20 Feet Assistive device: None Gait Pattern/deviations: Step-to pattern Gait Speed: Pace WFL General Gait Details: no LOB noted. Pt agitated about having to move.  Home Activity Instructions    Stairs            Modified Rankin (Stroke Patients Only)        Balance Overall balance assessment: Mild deficits observed, not formally tested                        Pertinent Vitals/Pain PT - Brief Vital Signs All  Vital Signs Stable: Yes Pain Assessment Pain Assessment: No/denies pain     Home Living Family/patient expects to be discharged to:: Shelter/Homeless             Additional Comments: patient reports he lives outside    Prior Function Level of Independence: Independent      UE/LE Assessment   UE ROM/Strength/Tone/Coordination: WFL    LE ROM/Strength/Tone/Coordination: [facility]      Communication   Communication Communication: No apparent difficulties Cueing Techniques: Verbal cues     Cognition Overall Cognitive Status: Appears within functional limits for tasks assessed/performed (Agitated)       General Comments General comments (skin integrity, edema, etc.): VSS on RA    Exercises     Assessment/Plan    PT Problem List         PT Visit Diagnosis Other abnormalities of gait and mobility (R26.89)    No Skilled PT Patient at baseline level of functioning;Patient is independent with all acitivity/mobility   Co-evaluation                AMPAC 6 Clicks Help needed turning from your back to your side while in a flat bed without using bedrails?: None Help needed moving from lying on your back to sitting on the side of a flat bed without using bedrails?: None Help needed moving to and from a bed to a chair (including a wheelchair)?: None Help needed standing up from a chair using your arms (e.g.,  wheelchair or bedside chair)?: None Help needed to walk in hospital room?: None Help needed climbing 3-5 steps with a railing? : None 6 Click Score: 24      End of Session   Activity Tolerance: Patient tolerated treatment well Patient left: in bed;with call bell/phone within reach Nurse Communication: Mobility status PT Visit Diagnosis: Other abnormalities of gait and mobility (R26.89)     Time: 1610-9604 PT Time Calculation (min) (ACUTE ONLY): 8 min  Charges:   PT Evaluation $PT Eval Low Complexity: 1 Low      Mable Dara B, PT, DPT Acute Rehab  Services [phone]   Gladys Damme  12/07/2023, 3:25 PM

## 2023-12-07 NOTE — Progress Notes (Signed)
PHARMACY - ANTICOAGULATION CONSULT NOTE  Pharmacy Consult for Lovenox Indication: VTE prophylaxis  Allergies  Allergen Reactions   Tegretol [Carbamazepine] Other (See Comments)    Vertigo and paralysis   Caffeine Palpitations    Patient Measurements: Height: 6' (182.9 cm) Weight: 54.4 kg (120 lb) IBW/kg (Calculated) : 77.6 Dosing weight: 54.4 kg  Vital Signs: Temp: 98.1 F (36.7 C) (01/28 0820) Temp Source: Oral (01/28 0820) BP: 92/63 (01/28 0829) Pulse Rate: 70 (01/28 0829)  Labs: Recent Labs    12/05/23 0748 12/05/23 1355 12/06/23 0536 12/07/23 0443 12/07/23 0700  HGB 9.7* 10.4* 10.4* 10.6*  --   HCT 29.2* 33.0* 31.6* 31.3*  --   PLT 143* 148* 140* 166  --   CREATININE 0.72  --  0.59*  --  0.60*    Estimated Creatinine Clearance: 97.3 mL/min (A) (by C-G formula based on SCr of 0.6 mg/dL (L)).   Medical History: Past Medical History:  Diagnosis Date   Alcohol abuse    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   Anxiety    Bipolar 2 disorder (HCC)    HIV (human immunodeficiency virus infection) (HCC)    Pancreatitis    PTSD (post-traumatic stress disorder)    Schizophrenia (HCC)    Seizures (HCC)    Subdural hematoma (HCC)     Medications:  Medications Prior to Admission  Medication Sig Dispense Refill Last Dose/Taking   bisacodyl (DULCOLAX) 5 MG EC tablet Take 1 tablet (5 mg total) by mouth daily as needed for moderate constipation. 30 tablet 0 Taking As Needed   levETIRAcetam (KEPPRA) 500 MG tablet Take 1 tablet (500 mg total) by mouth 2 (two) times daily. 60 tablet 0 Taking   oxyCODONE (OXY IR/ROXICODONE) 5 MG immediate release tablet Take 1 tablet (5 mg total) by mouth every 6 (six) hours as needed for severe pain (pain score 7-10) or moderate pain (pain score 4-6). 20 tablet 0 12/04/2023   [Paused] bictegravir-emtricitabine-tenofovir AF (BIKTARVY) 50-200-25 MG TABS tablet Take 1 tablet by mouth daily. (Patient not taking: Reported on 11/25/2023) 30  tablet 2    ondansetron (ZOFRAN-ODT) 4 MG disintegrating tablet Dissolve 1 tablet (4 mg total) by mouth every 8 (eight) hours as needed for nausea or vomiting. (Patient not taking: Reported on 12/05/2023) 20 tablet 0 Not Taking   ondansetron (ZOFRAN-ODT) 4 MG disintegrating tablet Dissolve 1 tablet (4 mg total) by mouth every 8 (eight) hours as needed for nausea or vomiting. (Patient not taking: Reported on 12/05/2023) 30 tablet 0 Not Taking   Scheduled:   chlordiazePOXIDE  10 mg Oral TID   Chlorhexidine Gluconate Cloth  6 each Topical Q0600   folic acid  1 mg Oral Daily   levETIRAcetam  500 mg Oral BID   multivitamin with minerals  1 tablet Oral Daily   mupirocin ointment  1 Application Nasal BID   pantoprazole (PROTONIX) IV  40 mg Intravenous Daily   polyethylene glycol  17 g Oral Daily   thiamine  100 mg Oral Daily   Infusions:   lactated ringers     PRN: acetaminophen **OR** acetaminophen, bisacodyl, hydrOXYzine, loperamide, LORazepam **OR** LORazepam, ondansetron **OR** ondansetron (ZOFRAN) IV, oxyCODONE   Assessment:  38 y.o male with alcohol induced acute pancreatitis,acute on chronic anemia,  HIV, seizure, polysubstance abuse.   Patient is underweight at  54.4 kg, height 6 feet.   BMI is 16.2.   LFTs are within normal limits.  Hgb 10.6 low/stable. PLTC  166.  No active bleeding reported.  Will dose  lovenox at lower dose of 30mg  q24h due to underweight, alcohol induced acute pancreatitis, and acute on chronic anemia.    Goal of Therapy:  VTE prevention Monitor platelets by anticoagulation protocol: Yes   Plan:  Lovenox 30 mg SQ q24hr Recommend to monitor for s/sx of bleding and CBC.  Pharmacy will sign off  Thank you for allowing pharmacy to be part of this patients care team. Noah Delaine, RPh Clinical Pharmacist 12/07/2023,11:17 AM  Noah Delaine, RPh Clinical Pharmacist

## 2023-12-07 NOTE — Progress Notes (Signed)
TRH night cross cover note:   I was notified by RN that this patient, who is hospitalized with acute pancreatitis, and is reporting breakthrough abdominal discomfort following his most recent dose of oxycodone 5 mg p.o. every 4 hours as needed.  I subsequently increased his prn oxycodone IR order to 10 mg po q4hours prn, and conveyed to the patient's RN that it is okay to administer the first dose of 10 mg of oxycodone IR now.      Gary Pigg, DO Hospitalist

## 2023-12-08 DIAGNOSIS — K852 Alcohol induced acute pancreatitis without necrosis or infection: Secondary | ICD-10-CM | POA: Diagnosis not present

## 2023-12-08 LAB — CBC WITH DIFFERENTIAL/PLATELET
Abs Immature Granulocytes: 0 10*3/uL (ref 0.00–0.07)
Basophils Absolute: 0 10*3/uL (ref 0.0–0.1)
Basophils Relative: 0 %
Eosinophils Absolute: 0.3 10*3/uL (ref 0.0–0.5)
Eosinophils Relative: 7 %
HCT: 28.7 % — ABNORMAL LOW (ref 39.0–52.0)
Hemoglobin: 9.6 g/dL — ABNORMAL LOW (ref 13.0–17.0)
Immature Granulocytes: 0 %
Lymphocytes Relative: 38 %
Lymphs Abs: 1.4 10*3/uL (ref 0.7–4.0)
MCH: 30.6 pg (ref 26.0–34.0)
MCHC: 33.4 g/dL (ref 30.0–36.0)
MCV: 91.4 fL (ref 80.0–100.0)
Monocytes Absolute: 0.3 10*3/uL (ref 0.1–1.0)
Monocytes Relative: 7 %
Neutro Abs: 1.7 10*3/uL (ref 1.7–7.7)
Neutrophils Relative %: 48 %
Platelets: 124 10*3/uL — ABNORMAL LOW (ref 150–400)
RBC: 3.14 MIL/uL — ABNORMAL LOW (ref 4.22–5.81)
RDW: 15.4 % (ref 11.5–15.5)
WBC: 3.6 10*3/uL — ABNORMAL LOW (ref 4.0–10.5)
nRBC: 0 % (ref 0.0–0.2)

## 2023-12-08 LAB — CD4/CD8 (T-HELPER/T-SUPPRESSOR CELL)
CD4 absolute: 460 /uL (ref 400–1790)
CD4%: 45.54 % (ref 33–65)
CD8 T Cell Abs: 362 /uL (ref 190–1000)
CD8tox: 35.83 % (ref 12–40)
Ratio: 1.27 (ref 1.0–3.0)
Total lymphocyte count: 1011 /uL (ref 1000–4000)

## 2023-12-08 LAB — T-HELPER CELLS (CD4) COUNT (NOT AT ARMC)
CD4 % Helper T Cell: 43 % (ref 33–65)
CD4 T Cell Abs: 598 /uL (ref 400–1790)

## 2023-12-08 LAB — COMPREHENSIVE METABOLIC PANEL
ALT: 11 U/L (ref 0–44)
AST: 15 U/L (ref 15–41)
Albumin: 2.8 g/dL — ABNORMAL LOW (ref 3.5–5.0)
Alkaline Phosphatase: 42 U/L (ref 38–126)
Anion gap: 9 (ref 5–15)
BUN: 10 mg/dL (ref 6–20)
CO2: 22 mmol/L (ref 22–32)
Calcium: 8.5 mg/dL — ABNORMAL LOW (ref 8.9–10.3)
Chloride: 104 mmol/L (ref 98–111)
Creatinine, Ser: 0.57 mg/dL — ABNORMAL LOW (ref 0.61–1.24)
GFR, Estimated: >60 mL/min (ref >60)
Glucose, Bld: 117 mg/dL — ABNORMAL HIGH (ref 70–99)
Potassium: 4.1 mmol/L (ref 3.5–5.1)
Sodium: 135 mmol/L (ref 135–145)
Total Bilirubin: 0.4 mg/dL (ref 0.0–1.2)
Total Protein: 5.3 g/dL — ABNORMAL LOW (ref 6.5–8.1)

## 2023-12-08 LAB — C-REACTIVE PROTEIN: CRP: 0.6 mg/dL (ref <1.0)

## 2023-12-08 LAB — HIV-1 RNA QUANT-NO REFLEX-BLD
HIV 1 RNA Quant: 90 {copies}/mL
LOG10 HIV-1 RNA: 1.954 {Log}

## 2023-12-08 LAB — MAGNESIUM: Magnesium: 1.7 mg/dL (ref 1.7–2.4)

## 2023-12-08 LAB — PROCALCITONIN: Procalcitonin: <0.1 ng/mL

## 2023-12-08 LAB — AMMONIA: Ammonia: 42 umol/L — ABNORMAL HIGH (ref 9–35)

## 2023-12-08 LAB — BRAIN NATRIURETIC PEPTIDE: B Natriuretic Peptide: 78.3 pg/mL (ref 0.0–100.0)

## 2023-12-08 LAB — HCV AB W REFLEX TO QUANT PCR: HCV Ab: NONREACTIVE

## 2023-12-08 LAB — HCV INTERPRETATION

## 2023-12-08 LAB — PHOSPHORUS: Phosphorus: 4.9 mg/dL — ABNORMAL HIGH (ref 2.5–4.6)

## 2023-12-08 MED ORDER — MAGNESIUM SULFATE IN D5W 1-5 GM/100ML-% IV SOLN
1.0000 g | Freq: Once | INTRAVENOUS | Status: AC
  Administered 2023-12-08: 1 g via INTRAVENOUS
  Filled 2023-12-08: qty 100

## 2023-12-08 MED ORDER — LORAZEPAM 1 MG PO TABS
1.0000 mg | ORAL_TABLET | Freq: Four times a day (QID) | ORAL | Status: DC | PRN
  Administered 2023-12-08: 1 mg via ORAL
  Filled 2023-12-08: qty 1

## 2023-12-08 MED ORDER — MIDODRINE HCL 5 MG PO TABS
5.0000 mg | ORAL_TABLET | Freq: Three times a day (TID) | ORAL | Status: DC
  Administered 2023-12-08 (×2): 5 mg via ORAL
  Filled 2023-12-08 (×2): qty 1

## 2023-12-08 NOTE — TOC CM/SW Note (Signed)
Transition of Care [facility]) - Inpatient Brief Assessment   Patient Details  Name: Gary [name] MRN: [id number] Date of Birth: 1986-01-30  [facility] of [facility]) CM/SW Contact:    Mearl Latin, LCSW Phone Number: 12/08/2023, 12:32 PM   Clinical Narrative: CSW received request for bus pass as patient is leaving AMA. CSW spoke with [facility] Supervisor and she stated unfortunately hospital cannot provide transport when leaving against medical advice. RN notified.    Transition of Care Asessment: Insurance and Status: Insurance coverage has been reviewed Patient has primary care physician: No (Pt did not go to appointment) Home environment has been reviewed: Homeless Prior level of function:: Independent Prior/Current Home Services: No current home services Social Drivers of Health Review: SDOH reviewed interventions complete Readmission risk has been reviewed: Yes Transition of care needs: no transition of care needs at this time

## 2023-12-08 NOTE — Progress Notes (Addendum)
PROGRESS NOTE                                                                                                                                                                                                             Patient Demographics:    Gary [name], is a 38 y.o. male, DOB - [birth year], NFA:[id number]  Outpatient Primary MD for the patient is Patient, No Pcp Per    LOS - 3  Admit date - 12/04/2023    Chief Complaint  Patient presents with   Abdominal Pain       Brief Narrative (HPI from H&P)   38 y.o. male with medical history significant of alcohol abuse, substance abuse, noncompliant HIV comes to ED  Admitted from 1/15/-1/22 for acute alcoholic pancreatitis   Subjective:   Patient in bed, appears comfortable, denies any headache, no fever, no chest pain or pressure, no shortness of breath , improved abdominal pain( but wants IV Dilaudid only), good appetite finished all meals yesterday and this am. No focal weakness.   Assessment  & Plan :    Alcohol induced acute pancreatitis 38 year old presenting with one day history of severe epigastric pain in setting of alcohol abuse found to have acute alcoholic pancreatitis by findings on CT abdomen and elevated lipase.  Counseled to quit alcohol, stable LFTs, CT scan abdomen pelvis does not show any CBD dilation or stones, triglycerides stable, continue supportive care with bowel rest, IV fluids, pain control and monitor.  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  Advance diet, advance activity likely discharge tomorrow with outpatient GI  follow-up.    Acute on chronic anemia - Hgb 8.1 on admit (was previously 10.7 on 1/22), due to heme dilution no signs of bleeding.  Continue PPI and monitor CBC, anemia panel unremarkable.  Borderline B12.  PCP to monitor.  Alcohol use disorder, severe, dependence (HCC) Still to quit, DTs much improved and upon distraction no tremors noted whatsoever, on Librium and CIWA protocol.  HIV (human immunodeficiency virus infection) (HCC) Follows at  [facility]. Non complaint, not compliant with his Biktarvy, also has pancreatitis.  Check CD4 count and viral load.  Counseled to follow-up with his ID clinic at [facility] within 7 to 10 days of discharge.  Seizure [facility]) Not taking his keppra daily, but tells me he will take this Likely alcohol induced Re -initiate keppra  Seizure precautions   Polysubstance abuse (HCC) UDS noted positive for benzos and opioids, counseled to quit all recreational drug abuse and prescription drug abuse.  Nicotine dependence, cigarettes, uncomplicated Declines nicotine patch   Dehydration, hypokalemia, hypotension.  Hydrated with IV fluids, replaced potassium.    Constipation Patient on bowel regimen        Condition -   Guarded  Family Communication  :  None  Code Status :  Full  Consults  :  None  PUD Prophylaxis : PPI   Procedures  :     CT - 1. Persistent inflammatory stranding about the head and uncinate process of the pancreas with small amount of peripancreatic fluid but less fluid than previously. 2. 1.2 cm pseudocyst in the pancreatic head and neck junction, previously 1.4 cm. 3. No mass enhancement or focal necrosis. Truncated pancreatic tail. 4. Chronic thickened folds in the duodenum likely reactive. 5. Constipation. 6. Mild hepatic steatosis.  Mild splenomegaly. 7. Two-vessel coronary calcific plaque, unusual in a 38 year old and age-advanced. Consider risk factor modification unless already being done. 8. Enlarged prostate. 9. 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 Yes; Fluid consistency: Thin  Diet effective now                    Inpatient Medications  Scheduled Meds:  chlordiazePOXIDE  10 mg Oral TID   Chlorhexidine Gluconate Cloth  6 each Topical Q0600   enoxaparin (LOVENOX) injection  30 mg Subcutaneous Q24H   folic acid  1 mg Oral Daily   levETIRAcetam  500 mg Oral BID   midodrine  5 mg Oral TID WC   multivitamin with minerals  1 tablet Oral Daily   mupirocin ointment  1 Application Nasal BID   pantoprazole (PROTONIX) IV  40 mg Intravenous Daily   polyethylene glycol  17 g Oral Daily   thiamine  100 mg Oral Daily   Continuous Infusions:  magnesium sulfate bolus IVPB     PRN Meds:.acetaminophen **OR** acetaminophen, bisacodyl, HYDROmorphone (DILAUDID) injection, hydrOXYzine, loperamide, ondansetron **OR** ondansetron (ZOFRAN) IV, oxyCODONE  Antibiotics  :    Anti-infectives (From admission, onward)    None         Objective:   Vitals:   12/07/23 2235 12/07/23 2334 12/08/23 0000 12/08/23 0400  BP: 92/71 96/74 108/80 (!) 88/57  Pulse: 76  85 (!) 59  Resp:  15    Temp:  97.6 F (36.4 C)    TempSrc:  Axillary    SpO2: 93%  95% 93%  Weight:      Height:        Wt Readings from Last 3 Encounters:  12/04/23  54.4 kg  12/03/23 60 kg  12/01/23 60.6 kg     Intake/Output Summary (Last 24 hours) at 12/08/2023 0824 Last data filed at 12/07/2023 2242 Gross per 24 hour  Intake 1725 ml  Output 350 ml  Net 1375 ml     Physical Exam  Awake Alert, No new F.N deficits, Normal affect, in no distress whatsoever, no tremors  when distracted [place].AT,PERRAL Supple Neck, No JVD,   Symmetrical Chest wall movement, Good air movement bilaterally, CTAB RRR,No Gallops,Rubs or new Murmurs,  +ve B.Sounds, Abd Soft, ?? mild epigastric tenderness >> inconsistent when distracted,   No Cyanosis, Clubbing or edema        Data Review:    Recent Labs  Lab 12/05/23 0221 12/05/23 0748 12/05/23 1355 12/06/23 0536 12/07/23 0443 12/08/23 0452  WBC 5.0 3.5* 3.5* 3.2* 3.9* 3.6*  HGB 8.1* 9.7* 10.4* 10.4* 10.6* 9.6*  HCT 24.1* 29.2* 33.0* 31.6* 31.3* 28.7*  PLT 179 143* 148* 140* 166 124*  MCV 90.9 91.3 94.8 91.1 90.2 91.4  MCH 30.6 30.3 29.9 30.0 30.5 30.6  MCHC 33.6 33.2 31.5 32.9 33.9 33.4  RDW 16.4* 16.2* 16.1* 15.5 15.3 15.4  LYMPHSABS 2.0  --   --   --  1.4 1.4  MONOABS 0.4  --   --   --  0.3 0.3  EOSABS 0.2  --   --   --  0.3 0.3  BASOSABS 0.0  --   --   --  0.0 0.0    Recent Labs  Lab 12/04/23 2312 12/05/23 0748 12/05/23 0805 12/06/23 0536 12/07/23 0700 12/08/23 0452  NA 140 140  --  137 139 135  K 4.2 3.6  --  3.2* 3.4* 4.1  CL 102 107  --  103 104 104  CO2 21* 25  --  24 26 22   ANIONGAP 17* 8  --  6 9 9   GLUCOSE 91 83  --  91 86 117*  BUN 13 11  --  <5* <5* 10  CREATININE 0.76 0.72  --  0.59* 0.60* 0.57*  AST 25 21  --  18 19 15   ALT 16 16  --  14 15 11   ALKPHOS 51 46  --  44 46 42  BILITOT 0.8 0.9  --  1.4* 0.8 0.4  ALBUMIN 4.0 3.5  --  2.9* 3.3* 2.8*  CRP  --   --   --   --  <0.5 0.6  PROCALCITON  --   --   --   --  <0.10 <0.10  TSH  --   --  8.994*  --   --   --   AMMONIA  --   --   --   --  15 42*  BNP  --   --   --   --   --  78.3  MG  --  1.8  --   --  1.7 1.7  PHOS  --   --   --   --  4.6 4.9*  CALCIUM 9.0 8.2*  --  8.4* 9.1 8.5*      Recent Labs  Lab 12/04/23 2312 12/05/23 0748 12/05/23 0805 12/06/23 0536 12/07/23 0700 12/08/23 0452  CRP  --   --   --   --  <0.5 0.6  PROCALCITON  --   --   --   --  <0.10 <0.10  TSH  --   --  8.994*  --   --   --  AMMONIA  --   --   --    --  15 42*  BNP  --   --   --   --   --  78.3  MG  --  1.8  --   --  1.7 1.7  CALCIUM 9.0 8.2*  --  8.4* 9.1 8.5*    --------------------------------------------------------------------------------------------------------------- Lab Results  Component Value Date   CHOL 162 12/15/2022   HDL 79 12/15/2022   LDLCALC 73 12/15/2022   TRIG 94 10/10/2023   CHOLHDL 2.1 12/15/2022    Lab Results  Component Value Date   HGBA1C 5.0 05/25/2023   Recent Labs    12/05/23 1355  FREET4 0.90   No results for input(s): "VITAMINB12", "FOLATE", "FERRITIN", "TIBC", "IRON", "RETICCTPCT" in the last 72 hours.      Micro Results Recent Results (from the past 240 hours)  MRSA Next Gen by PCR, Nasal     Status: Abnormal   Collection Time: 12/06/23  9:06 PM   Specimen: Nasal Mucosa; Nasal Swab  Result Value Ref Range Status   MRSA by PCR Next Gen DETECTED (A) NOT DETECTED Final    Comment: RESULTS CALLED TO,READ BACK BY AND VERIFIED WITH RN [email or web address] ON 12/06/23 AT 2350 BY NM (NOTE) The GeneXpert MRSA Assay (FDA approved for NASAL specimens only), is one component of a comprehensive MRSA colonization surveillance program. It is not intended to diagnose MRSA infection nor to guide or monitor treatment for MRSA infections. Test performance is not FDA approved in patients less than 34 years old. Performed at [facility], [address] [place], [place] [address]     Radiology Reports CT ABDOMEN PELVIS W CONTRAST Result Date: 12/05/2023 CLINICAL DATA:  Acute severe pancreatitis with abdominal pain. EXAM: CT ABDOMEN AND PELVIS WITH CONTRAST TECHNIQUE: Multidetector CT imaging of the abdomen and pelvis was performed using the standard protocol following bolus administration of intravenous contrast. RADIATION DOSE REDUCTION: This exam was performed according to the departmental dose-optimization program which includes automated exposure control, adjustment of the mA and/or kV according to  patient size and/or use of iterative reconstruction technique. CONTRAST:  75mL OMNIPAQUE IOHEXOL 350 MG/ML SOLN COMPARISON:  Multiple prior CTs since 2017. The 2 most recent are both with contrast and dated 11/25/2023 and 11/18/2023. 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 Stable appearance of overlying atelectasis in the left lower lobe. Lung bases are otherwise clear. The cardiac size is normal. There is scattered two-vessel coronary calcific plaque in the LAD and right coronary arteries but this is unusual in a 38 year old and age-advanced. Consider risk factor modification unless already being done. Hepatobiliary: No focal liver abnormality is seen. No gallstones, gallbladder wall thickening, or biliary dilatation. The liver is mildly steatotic. Pancreas: Small pancreas with truncated tail section. Inflammatory stranding continues to be seen about the head and uncinate process with small amount of peripancreatic fluid in the area but less fluid than previously. Again noted is a 1.2 cm pseudocyst, previously 1.4 cm in the pancreatic head and neck junction. No other localizing collection is seen. The overall appearance is similar to 11/18/2023, as above with decreased peripancreatic fluid since 11/24/2018. There is no mass enhancement.  No focal necrosis is seen. Spleen: Mildly prominent, measures 14 cm length. No mass enhancement. Adrenals/Urinary Tract: There is no adrenal mass. 8 mm Bosniak 2 cyst in the inferior pole of the right kidney is too small to characterize but unchanged. Both  kidneys are otherwise homogeneous. There is no urinary stone or obstruction. The bladder is unremarkable. Stomach/Bowel: There are chronic thickened folds in the duodenum likely reactive. No bowel obstruction or inflammation. No evidence of appendicitis. Moderate fecal stasis. Vascular/Lymphatic: No significant vascular findings are present. No enlarged  abdominal or pelvic lymph nodes. Reproductive: Enlarged prostate 5.1 cm transverse, also unusual at this age. Other: No pelvic free fluid. No free hemorrhage, free air or abscess. No incarcerated hernia. Musculoskeletal: Degenerative disc disease and endplate irregularities of the lower thoracic spine. Transitional anatomy lumbosacral junction. No acute or other significant osseous findings. IMPRESSION: 1. Persistent inflammatory stranding about the head and uncinate process of the pancreas with small amount of peripancreatic fluid but less fluid than previously. 2. 1.2 cm pseudocyst in the pancreatic head and neck junction, previously 1.4 cm. 3. No mass enhancement or focal necrosis. Truncated pancreatic tail. 4. Chronic thickened folds in the duodenum likely reactive. 5. Constipation. 6. Mild hepatic steatosis.  Mild splenomegaly. 7. Two-vessel coronary calcific plaque, unusual in a 38 year old and age-advanced. Consider risk factor modification unless already being done. 8. Enlarged prostate. 9. 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 Electronically Signed   By: Almira Bar M.D.   On: 12/05/2023 05:42      Signature  -   Susa Raring M.D on 12/08/2023 at 8:24 AM   -  To page go to [email or web address]

## 2023-12-08 NOTE — Plan of Care (Signed)

## 2023-12-08 NOTE — Discharge Summary (Signed)
AMA  Patient at this time expresses desire to leave the Hospital immidiately, patient has been warned that this is not Medically advisable at this time, and can result in Medical complications like Death and Disability, patient understands and accepts the risks involved and assumes full responsibilty of this decision.   Susa Raring M.D on 12/08/2023 at 3:37 PM  [facility]  Time < 30 minutes  Last Note Below                                                                      PROGRESS NOTE                                                                                                                                                                                                             Patient Demographics:    Gary [name], is a 38 y.o. male, DOB - 1985-11-26, YNW:[id number]  Outpatient Primary MD for the patient is Patient, No Pcp Per    LOS - 3  Admit date - 12/04/2023    Chief Complaint  Patient presents with   Abdominal Pain       Brief Narrative (HPI from H&P)   38 y.o. male with medical history significant of alcohol abuse, substance abuse, noncompliant HIV comes to ED  Admitted from 1/15/-1/22 for acute alcoholic pancreatitis   Subjective:   Patient in bed, appears comfortable, denies any headache, no fever, no chest pain or pressure, no shortness of breath , improved abdominal pain( but wants IV Dilaudid only), good appetite finished all meals yesterday and this am. No focal weakness.   Assessment  & Plan :    Alcohol induced acute pancreatitis 38 year old presenting with one day history of severe epigastric pain in setting of alcohol abuse found to have acute alcoholic pancreatitis by findings on CT abdomen and elevated lipase.  Counseled to quit alcohol, stable LFTs, CT scan abdomen pelvis does not show any CBD  dilation or stones, triglycerides stable, continue supportive care with bowel rest, IV fluids, pain control and monitor.  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  Advance diet, advance activity likely discharge tomorrow with outpatient GI follow-up.    Acute on chronic anemia - Hgb 8.1 on admit (was previously 10.7 on 1/22), due to heme dilution no signs of bleeding.  Continue PPI and monitor CBC, anemia panel unremarkable.  Borderline B12.  PCP to monitor.  Alcohol use disorder, severe, dependence (HCC) Still to quit, DTs much improved and upon distraction no tremors noted whatsoever, on Librium and CIWA protocol.  HIV (human immunodeficiency virus infection) (HCC) Follows at [facility]. Non complaint, not compliant with his Biktarvy, also has pancreatitis.  Check CD4 count and viral load.  Counseled to follow-up with his ID clinic at [facility] within 7 to 10 days of discharge.  Seizure [facility]) Not taking his keppra daily, but tells me he will take this Likely alcohol induced Re -initiate keppra  Seizure precautions   Polysubstance abuse (HCC) UDS noted positive for benzos and opioids, counseled to quit all recreational drug abuse and prescription drug abuse.  Nicotine dependence, cigarettes, uncomplicated Declines nicotine patch   Dehydration, hypokalemia, hypotension.  Hydrated with IV fluids, replaced potassium.    Constipation Patient on bowel regimen        Condition -   Guarded  Family Communication  :  None  Code Status :  Full  Consults  :  None  PUD Prophylaxis : PPI   Procedures  :     CT - 1. Persistent inflammatory stranding about  the head and uncinate process of the pancreas with small amount of peripancreatic fluid but less fluid than previously. 2. 1.2 cm pseudocyst in the pancreatic head and neck junction, previously 1.4 cm. 3. No mass enhancement or focal necrosis. Truncated pancreatic tail. 4. Chronic thickened folds in the duodenum likely reactive. 5. Constipation. 6. Mild hepatic steatosis.  Mild splenomegaly. 7. Two-vessel coronary calcific plaque, unusual in a 38 year old and age-advanced. Consider risk factor modification unless already being done. 8. Enlarged prostate. 9. 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 Yes; Fluid consistency: Thin  Diet effective now                    Inpatient Medications  Scheduled Meds:  chlordiazePOXIDE  10 mg Oral TID   Chlorhexidine Gluconate Cloth  6 each Topical Q0600   enoxaparin (LOVENOX) injection  30 mg Subcutaneous Q24H   folic acid  1 mg Oral Daily   levETIRAcetam  500 mg Oral BID   midodrine  5 mg Oral TID WC   multivitamin with minerals  1 tablet Oral Daily   mupirocin ointment  1 Application Nasal BID   pantoprazole (PROTONIX) IV  40 mg Intravenous Daily   polyethylene glycol  17 g Oral Daily   thiamine  100 mg Oral Daily   Continuous Infusions:   PRN Meds:.acetaminophen **OR** acetaminophen, bisacodyl, LORazepam, ondansetron **OR** ondansetron (ZOFRAN) IV, oxyCODONE  Antibiotics  :    Anti-infectives (From admission, onward)    None         Objective:   Vitals:   12/07/23 2235 12/07/23 2334 12/08/23 0000 12/08/23 0400  BP: 92/71 96/74 108/80 (!) 88/57  Pulse: 76  85 (!) 59  Resp:  15    Temp:  97.6 F (36.4 C)    TempSrc:  Axillary    SpO2:  93%  95% 93%  Weight:      Height:        Wt Readings from Last 3 Encounters:  12/04/23 54.4 kg  12/03/23 60 kg  12/01/23 60.6 kg     Intake/Output Summary (Last 24 hours) at 12/08/2023 1537 Last data filed at 12/07/2023 2242 Gross per 24 hour  Intake 725 ml  Output 350 ml  Net 375 ml     Physical Exam  Awake Alert, No new F.N deficits, Normal affect, in no distress whatsoever, no tremors when distracted [place].AT,PERRAL Supple Neck, No JVD,   Symmetrical Chest wall movement, Good air movement bilaterally, CTAB RRR,No Gallops,Rubs or new Murmurs,  +ve B.Sounds, Abd Soft, ?? mild epigastric tenderness >> inconsistent when distracted,   No Cyanosis, Clubbing or edema        Data Review:    Recent Labs  Lab 12/05/23 0221 12/05/23 0748 12/05/23 1355 12/06/23 0536 12/07/23 0443 12/08/23 0452  WBC 5.0 3.5* 3.5* 3.2* 3.9* 3.6*  HGB 8.1* 9.7* 10.4* 10.4* 10.6* 9.6*  HCT 24.1* 29.2* 33.0* 31.6* 31.3* 28.7*  PLT 179 143* 148* 140* 166 124*  MCV 90.9 91.3 94.8 91.1 90.2 91.4  MCH 30.6 30.3 29.9 30.0 30.5 30.6  MCHC 33.6 33.2 31.5 32.9 33.9 33.4  RDW 16.4* 16.2* 16.1* 15.5 15.3 15.4  LYMPHSABS 2.0  --   --   --  1.4 1.4  MONOABS 0.4  --   --   --  0.3 0.3  EOSABS 0.2  --   --   --  0.3 0.3  BASOSABS 0.0  --   --   --  0.0 0.0    Recent Labs  Lab 12/04/23 2312 12/05/23 0748 12/05/23 0805 12/06/23 0536 12/07/23 0700 12/08/23 0452  NA 140 140  --  137 139 135  K 4.2 3.6  --  3.2* 3.4* 4.1  CL 102 107  --  103 104 104  CO2 21* 25  --  24 26 22   ANIONGAP 17* 8  --  6 9 9   GLUCOSE 91 83  --  91 86 117*  BUN 13 11  --  <5* <5* 10  CREATININE 0.76 0.72  --  0.59* 0.60* 0.57*  AST 25 21  --  18 19 15   ALT 16 16  --  14 15 11   ALKPHOS 51 46  --  44 46 42  BILITOT 0.8 0.9  --  1.4* 0.8 0.4  ALBUMIN 4.0 3.5  --  2.9* 3.3* 2.8*  CRP  --   --   --   --  <0.5 0.6  PROCALCITON  --   --   --   --  <0.10 <0.10  TSH  --   --  8.994*  --   --   --   AMMONIA  --   --   --    --  15 42*  BNP  --   --   --   --   --  78.3  MG  --  1.8  --   --  1.7 1.7  PHOS  --   --   --   --  4.6 4.9*  CALCIUM 9.0 8.2*  --  8.4* 9.1 8.5*      Recent Labs  Lab 12/04/23 2312 12/05/23 0748 12/05/23 0805 12/06/23 0536 12/07/23 0700 12/08/23 0452  CRP  --   --   --   --  <0.5 0.6  PROCALCITON  --   --   --   --  <0.10 <0.10  TSH  --   --  8.994*  --   --   --   AMMONIA  --   --   --   --  15 42*  BNP  --   --   --   --   --  78.3  MG  --  1.8  --   --  1.7 1.7  CALCIUM 9.0 8.2*  --  8.4* 9.1 8.5*    --------------------------------------------------------------------------------------------------------------- Lab Results  Component Value Date   CHOL 162 12/15/2022   HDL 79 12/15/2022   LDLCALC 73 12/15/2022   TRIG 94 10/10/2023   CHOLHDL 2.1 12/15/2022    Lab Results  Component Value Date   HGBA1C 5.0 05/25/2023   No results for input(s): "TSH", "T4TOTAL", "FREET4", "T3FREE", "THYROIDAB" in the last 72 hours.  No results for input(s): "VITAMINB12", "FOLATE", "FERRITIN", "TIBC", "IRON", "RETICCTPCT" in the last 72 hours.      Micro Results Recent Results (from the past 240 hours)  MRSA Next Gen by PCR, Nasal     Status: Abnormal   Collection Time: 12/06/23  9:06 PM   Specimen: Nasal Mucosa; Nasal Swab  Result Value Ref Range Status   MRSA by PCR Next Gen DETECTED (A) NOT DETECTED Final    Comment: RESULTS CALLED TO,READ BACK BY AND VERIFIED WITH RN Z.BULLARD ON 12/06/23 AT 2350 BY NM (NOTE) The GeneXpert MRSA Assay (FDA approved for NASAL specimens only), is one component of a comprehensive MRSA colonization surveillance program. It is not intended to diagnose MRSA infection nor to guide or monitor treatment for MRSA infections. Test performance is not FDA approved in patients less than 56 years old. Performed at [facility], [address] [place], [place] [address]     Radiology Reports CT ABDOMEN PELVIS W CONTRAST Result Date:  12/05/2023 CLINICAL DATA:  Acute severe pancreatitis with abdominal pain. EXAM: CT ABDOMEN AND PELVIS WITH CONTRAST TECHNIQUE: Multidetector CT imaging of the abdomen and pelvis was performed using the standard protocol following bolus administration of intravenous contrast. RADIATION DOSE REDUCTION: This exam was performed according to the departmental dose-optimization program which includes automated exposure control, adjustment of the mA and/or kV according to patient size and/or use of iterative reconstruction technique. CONTRAST:  75mL OMNIPAQUE IOHEXOL 350 MG/ML SOLN COMPARISON:  Multiple prior CTs since 2017. The 2 most recent are both with contrast and dated 11/25/2023 and 11/18/2023. 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 Stable appearance of overlying atelectasis in the left lower lobe. Lung bases are otherwise clear. The cardiac size is normal. There is scattered two-vessel coronary calcific plaque in the LAD and right coronary arteries but this is unusual in a 38 year old and age-advanced. Consider risk factor modification unless already being done. Hepatobiliary: No focal liver abnormality is  seen. No gallstones, gallbladder wall thickening, or biliary dilatation. The liver is mildly steatotic. Pancreas: Small pancreas with truncated tail section. Inflammatory stranding continues to be seen about the head and uncinate process with small amount of peripancreatic fluid in the area but less fluid than previously. Again noted is a 1.2 cm pseudocyst, previously 1.4 cm in the pancreatic head and neck junction. No other localizing collection is seen. The overall appearance is similar to 11/18/2023, as above with decreased peripancreatic fluid since 11/24/2018. There is no mass enhancement.  No focal necrosis is seen. Spleen: Mildly prominent, measures 14 cm length. No mass enhancement. Adrenals/Urinary Tract: There is no adrenal  mass. 8 mm Bosniak 2 cyst in the inferior pole of the right kidney is too small to characterize but unchanged. Both kidneys are otherwise homogeneous. There is no urinary stone or obstruction. The bladder is unremarkable. Stomach/Bowel: There are chronic thickened folds in the duodenum likely reactive. No bowel obstruction or inflammation. No evidence of appendicitis. Moderate fecal stasis. Vascular/Lymphatic: No significant vascular findings are present. No enlarged abdominal or pelvic lymph nodes. Reproductive: Enlarged prostate 5.1 cm transverse, also unusual at this age. Other: No pelvic free fluid. No free hemorrhage, free air or abscess. No incarcerated hernia. Musculoskeletal: Degenerative disc disease and endplate irregularities of the lower thoracic spine. Transitional anatomy lumbosacral junction. No acute or other significant osseous findings. IMPRESSION: 1. Persistent inflammatory stranding about the head and uncinate process of the pancreas with small amount of peripancreatic fluid but less fluid than previously. 2. 1.2 cm pseudocyst in the pancreatic head and neck junction, previously 1.4 cm. 3. No mass enhancement or focal necrosis. Truncated pancreatic tail. 4. Chronic thickened folds in the duodenum likely reactive. 5. Constipation. 6. Mild hepatic steatosis.  Mild splenomegaly. 7. Two-vessel coronary calcific plaque, unusual in a 38 year old and age-advanced. Consider risk factor modification unless already being done. 8. Enlarged prostate. 9. 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 Electronically Signed   By: Almira Bar M.D.   On: 12/05/2023 05:42      Signature  -   Susa Raring M.D on 12/08/2023 at 3:37 PM   -  To page go to [email or web address]

## 2023-12-09 ENCOUNTER — Encounter (HOSPITAL_COMMUNITY): Payer: Self-pay

## 2023-12-09 ENCOUNTER — Emergency Department (HOSPITAL_COMMUNITY)
Admission: EM | Admit: 2023-12-09 | Discharge: 2023-12-10 | Disposition: A | Discharge status: Home / Self Care | Payer: MEDICAID | Attending: Emergency Medicine | Admitting: Emergency Medicine

## 2023-12-09 DIAGNOSIS — K860 Alcohol-induced chronic pancreatitis: Secondary | ICD-10-CM | POA: Insufficient documentation

## 2023-12-09 DIAGNOSIS — Z21 Asymptomatic human immunodeficiency virus [HIV] infection status: Secondary | ICD-10-CM | POA: Insufficient documentation

## 2023-12-09 LAB — COMPREHENSIVE METABOLIC PANEL
ALT: 16 U/L (ref 0–44)
AST: 19 U/L (ref 15–41)
Albumin: 3.7 g/dL (ref 3.5–5.0)
Alkaline Phosphatase: 55 U/L (ref 38–126)
Anion gap: 11 (ref 5–15)
BUN: 9 mg/dL (ref 6–20)
CO2: 25 mmol/L (ref 22–32)
Calcium: 9.1 mg/dL (ref 8.9–10.3)
Chloride: 106 mmol/L (ref 98–111)
Creatinine, Ser: 0.64 mg/dL (ref 0.61–1.24)
GFR, Estimated: >60 mL/min (ref >60)
Glucose, Bld: 113 mg/dL — ABNORMAL HIGH (ref 70–99)
Potassium: 3.7 mmol/L (ref 3.5–5.1)
Sodium: 142 mmol/L (ref 135–145)
Total Bilirubin: 0.6 mg/dL (ref 0.0–1.2)
Total Protein: 6.9 g/dL (ref 6.5–8.1)

## 2023-12-09 LAB — CBC WITH DIFFERENTIAL/PLATELET
Abs Immature Granulocytes: 0 10*3/uL (ref 0.00–0.07)
Basophils Absolute: 0.1 10*3/uL (ref 0.0–0.1)
Basophils Relative: 1 %
Eosinophils Absolute: 0.1 10*3/uL (ref 0.0–0.5)
Eosinophils Relative: 3 %
HCT: 30.8 % — ABNORMAL LOW (ref 39.0–52.0)
Hemoglobin: 10.2 g/dL — ABNORMAL LOW (ref 13.0–17.0)
Immature Granulocytes: 0 %
Lymphocytes Relative: 34 %
Lymphs Abs: 1.4 10*3/uL (ref 0.7–4.0)
MCH: 30.1 pg (ref 26.0–34.0)
MCHC: 33.1 g/dL (ref 30.0–36.0)
MCV: 90.9 fL (ref 80.0–100.0)
Monocytes Absolute: 0.3 10*3/uL (ref 0.1–1.0)
Monocytes Relative: 6 %
Neutro Abs: 2.3 10*3/uL (ref 1.7–7.7)
Neutrophils Relative %: 56 %
Platelets: 218 10*3/uL (ref 150–400)
RBC: 3.39 MIL/uL — ABNORMAL LOW (ref 4.22–5.81)
RDW: 16 % — ABNORMAL HIGH (ref 11.5–15.5)
WBC: 4.2 10*3/uL (ref 4.0–10.5)
nRBC: 0 % (ref 0.0–0.2)

## 2023-12-09 LAB — LIPASE, BLOOD: Lipase: 347 U/L — ABNORMAL HIGH (ref 11–51)

## 2023-12-09 NOTE — ED Provider Triage Note (Signed)
Emergency Medicine Provider Triage Evaluation Note  Gary [name] , a 38 y.o. male  was evaluated in triage.  Pt complains of "pancreatitis".  Review of Systems  Positive: Abdominal pain, nausea, vomiting, etoh use  Negative: Diarrhea   Physical Exam  There were no vitals taken for this visit. Gen:   Awake, no distress   Resp:  Normal effort  MSK:   Moves extremities without difficulty  Other:    Medical Decision Making  Medically screening exam initiated at 7:59 PM.  Appropriate orders placed.  HARBERT FITTERER was informed that the remainder of the evaluation will be completed by another provider, this initial triage assessment does not replace that evaluation, and the importance of remaining in the ED until their evaluation is complete.     Lonell Grandchild, MD 12/09/23 Rosamaria Lints

## 2023-12-09 NOTE — ED Triage Notes (Signed)
Pt is coming in for his chronic problem of pancreatitis and abd pain, pt was picked up outside of [place]. He is otherwise stable at this time.   Medic vitals   110/70 110hr 96%ra 18rr

## 2023-12-10 ENCOUNTER — Inpatient Hospital Stay (HOSPITAL_COMMUNITY)
Admission: EM | Admit: 2023-12-10 | Discharge: 2023-12-15 | DRG: 439 | Disposition: A | Discharge status: Home / Self Care | Payer: MEDICAID | Attending: Internal Medicine | Admitting: Internal Medicine

## 2023-12-10 ENCOUNTER — Other Ambulatory Visit: Payer: Self-pay

## 2023-12-10 ENCOUNTER — Other Ambulatory Visit (HOSPITAL_COMMUNITY): Payer: Self-pay

## 2023-12-10 DIAGNOSIS — R001 Bradycardia, unspecified: Secondary | ICD-10-CM | POA: Diagnosis not present

## 2023-12-10 DIAGNOSIS — F1910 Other psychoactive substance abuse, uncomplicated: Secondary | ICD-10-CM | POA: Diagnosis present

## 2023-12-10 DIAGNOSIS — F10232 Alcohol dependence with withdrawal with perceptual disturbance: Secondary | ICD-10-CM | POA: Diagnosis not present

## 2023-12-10 DIAGNOSIS — F10239 Alcohol dependence with withdrawal, unspecified: Secondary | ICD-10-CM | POA: Diagnosis present

## 2023-12-10 DIAGNOSIS — K852 Alcohol induced acute pancreatitis without necrosis or infection: Principal | ICD-10-CM | POA: Diagnosis present

## 2023-12-10 DIAGNOSIS — Z800 Family history of malignant neoplasm of digestive organs: Secondary | ICD-10-CM

## 2023-12-10 DIAGNOSIS — Z91128 Patient's intentional underdosing of medication regimen for other reason: Secondary | ICD-10-CM

## 2023-12-10 DIAGNOSIS — Z91048 Other nonmedicinal substance allergy status: Secondary | ICD-10-CM | POA: Diagnosis not present

## 2023-12-10 DIAGNOSIS — Z5900 Homelessness unspecified: Secondary | ICD-10-CM

## 2023-12-10 DIAGNOSIS — G40909 Epilepsy, unspecified, not intractable, without status epilepticus: Secondary | ICD-10-CM | POA: Diagnosis present

## 2023-12-10 DIAGNOSIS — Z79899 Other long term (current) drug therapy: Secondary | ICD-10-CM

## 2023-12-10 DIAGNOSIS — T375X6A Underdosing of antiviral drugs, initial encounter: Secondary | ICD-10-CM | POA: Diagnosis present

## 2023-12-10 DIAGNOSIS — Z888 Allergy status to other drugs, medicaments and biological substances status: Secondary | ICD-10-CM

## 2023-12-10 DIAGNOSIS — R112 Nausea with vomiting, unspecified: Secondary | ICD-10-CM | POA: Diagnosis present

## 2023-12-10 DIAGNOSIS — F17210 Nicotine dependence, cigarettes, uncomplicated: Secondary | ICD-10-CM | POA: Diagnosis present

## 2023-12-10 DIAGNOSIS — Z21 Asymptomatic human immunodeficiency virus [HIV] infection status: Secondary | ICD-10-CM | POA: Diagnosis not present

## 2023-12-10 DIAGNOSIS — B20 Human immunodeficiency virus [HIV] disease: Secondary | ICD-10-CM | POA: Diagnosis present

## 2023-12-10 DIAGNOSIS — F1020 Alcohol dependence, uncomplicated: Secondary | ICD-10-CM | POA: Diagnosis not present

## 2023-12-10 DIAGNOSIS — F3181 Bipolar II disorder: Secondary | ICD-10-CM | POA: Diagnosis present

## 2023-12-10 DIAGNOSIS — Z811 Family history of alcohol abuse and dependence: Secondary | ICD-10-CM | POA: Diagnosis not present

## 2023-12-10 LAB — URINALYSIS, ROUTINE W REFLEX MICROSCOPIC
Bacteria, UA: NONE SEEN
Bilirubin Urine: NEGATIVE
Glucose, UA: NEGATIVE mg/dL
Hgb urine dipstick: NEGATIVE
Ketones, ur: NEGATIVE mg/dL
Leukocytes,Ua: NEGATIVE
Nitrite: NEGATIVE
Protein, ur: 30 mg/dL — AB
Specific Gravity, Urine: 1.031 — ABNORMAL HIGH (ref 1.005–1.030)
pH: 5 (ref 5.0–8.0)

## 2023-12-10 LAB — COMPREHENSIVE METABOLIC PANEL
ALT: 15 U/L (ref 0–44)
AST: 19 U/L (ref 15–41)
Albumin: 3.9 g/dL (ref 3.5–5.0)
Alkaline Phosphatase: 52 U/L (ref 38–126)
Anion gap: 12 (ref 5–15)
BUN: 10 mg/dL (ref 6–20)
CO2: 24 mmol/L (ref 22–32)
Calcium: 8.7 mg/dL — ABNORMAL LOW (ref 8.9–10.3)
Chloride: 103 mmol/L (ref 98–111)
Creatinine, Ser: 0.31 mg/dL — ABNORMAL LOW (ref 0.61–1.24)
GFR, Estimated: >60 mL/min (ref >60)
Glucose, Bld: 110 mg/dL — ABNORMAL HIGH (ref 70–99)
Potassium: 3.5 mmol/L (ref 3.5–5.1)
Sodium: 139 mmol/L (ref 135–145)
Total Bilirubin: 0.6 mg/dL (ref 0.0–1.2)
Total Protein: 7.3 g/dL (ref 6.5–8.1)

## 2023-12-10 LAB — HIV-1/HIV-2 QUAL RNA
HIV-1 RNA, Qualitative: REACTIVE
HIV-2 RNA, Qualitative: NONREACTIVE

## 2023-12-10 LAB — CBC
HCT: 32 % — ABNORMAL LOW (ref 39.0–52.0)
Hemoglobin: 10.4 g/dL — ABNORMAL LOW (ref 13.0–17.0)
MCH: 30.3 pg (ref 26.0–34.0)
MCHC: 32.5 g/dL (ref 30.0–36.0)
MCV: 93.3 fL (ref 80.0–100.0)
Platelets: 213 10*3/uL (ref 150–400)
RBC: 3.43 MIL/uL — ABNORMAL LOW (ref 4.22–5.81)
RDW: 16.5 % — ABNORMAL HIGH (ref 11.5–15.5)
WBC: 4 10*3/uL (ref 4.0–10.5)
nRBC: 0 % (ref 0.0–0.2)

## 2023-12-10 LAB — LIPASE, BLOOD: Lipase: 1080 U/L — ABNORMAL HIGH (ref 11–51)

## 2023-12-10 MED ORDER — LORAZEPAM 2 MG/ML IJ SOLN
1.0000 mg | INTRAMUSCULAR | Status: AC | PRN
  Administered 2023-12-10 – 2023-12-11 (×4): 2 mg via INTRAVENOUS
  Administered 2023-12-11: 3 mg via INTRAVENOUS
  Administered 2023-12-11 – 2023-12-13 (×16): 2 mg via INTRAVENOUS
  Filled 2023-12-10 (×18): qty 1
  Filled 2023-12-10: qty 2
  Filled 2023-12-10 (×2): qty 1

## 2023-12-10 MED ORDER — ONDANSETRON HCL 4 MG/2ML IJ SOLN
4.0000 mg | Freq: Four times a day (QID) | INTRAMUSCULAR | Status: DC | PRN
  Administered 2023-12-11 – 2023-12-14 (×6): 4 mg via INTRAVENOUS
  Filled 2023-12-10 (×6): qty 2

## 2023-12-10 MED ORDER — OXYCODONE HCL 5 MG PO TABS
5.0000 mg | ORAL_TABLET | Freq: Once | ORAL | Status: AC
  Administered 2023-12-10: 5 mg via ORAL
  Filled 2023-12-10: qty 1

## 2023-12-10 MED ORDER — SODIUM CHLORIDE 0.9% FLUSH
3.0000 mL | Freq: Two times a day (BID) | INTRAVENOUS | Status: DC
  Administered 2023-12-10 – 2023-12-15 (×10): 3 mL via INTRAVENOUS

## 2023-12-10 MED ORDER — SUCRALFATE 1 G PO TABS
1.0000 g | ORAL_TABLET | Freq: Three times a day (TID) | ORAL | 0 refills | Status: DC
  Filled 2023-12-10: qty 120, 30d supply, fill #0

## 2023-12-10 MED ORDER — LEVETIRACETAM 500 MG PO TABS
500.0000 mg | ORAL_TABLET | Freq: Two times a day (BID) | ORAL | Status: DC
  Administered 2023-12-10 – 2023-12-12 (×4): 500 mg via ORAL
  Filled 2023-12-10 (×4): qty 1

## 2023-12-10 MED ORDER — ADULT MULTIVITAMIN W/MINERALS CH
1.0000 | ORAL_TABLET | Freq: Every day | ORAL | Status: DC
  Administered 2023-12-10 – 2023-12-15 (×6): 1 via ORAL
  Filled 2023-12-10 (×6): qty 1

## 2023-12-10 MED ORDER — OXYCODONE-ACETAMINOPHEN 5-325 MG PO TABS
1.0000 | ORAL_TABLET | Freq: Once | ORAL | Status: AC
  Administered 2023-12-10: 1 via ORAL
  Filled 2023-12-10: qty 1

## 2023-12-10 MED ORDER — OXYCODONE HCL 5 MG PO TABS
5.0000 mg | ORAL_TABLET | ORAL | Status: DC | PRN
  Administered 2023-12-11 (×2): 5 mg via ORAL
  Filled 2023-12-10 (×2): qty 1

## 2023-12-10 MED ORDER — THIAMINE MONONITRATE 100 MG PO TABS
100.0000 mg | ORAL_TABLET | Freq: Every day | ORAL | Status: DC
  Administered 2023-12-10 – 2023-12-15 (×5): 100 mg via ORAL
  Filled 2023-12-10 (×5): qty 1

## 2023-12-10 MED ORDER — ACETAMINOPHEN 325 MG PO TABS
650.0000 mg | ORAL_TABLET | Freq: Four times a day (QID) | ORAL | Status: DC | PRN
  Administered 2023-12-11: 650 mg via ORAL
  Filled 2023-12-10: qty 2

## 2023-12-10 MED ORDER — SODIUM CHLORIDE 0.9 % IV BOLUS
1000.0000 mL | Freq: Once | INTRAVENOUS | Status: AC
  Administered 2023-12-10: 1000 mL via INTRAVENOUS

## 2023-12-10 MED ORDER — ONDANSETRON HCL 4 MG/2ML IJ SOLN
4.0000 mg | Freq: Once | INTRAMUSCULAR | Status: AC
  Administered 2023-12-10: 4 mg via INTRAVENOUS
  Filled 2023-12-10: qty 2

## 2023-12-10 MED ORDER — ONDANSETRON 8 MG PO TBDP
8.0000 mg | ORAL_TABLET | Freq: Once | ORAL | Status: AC
  Administered 2023-12-10: 8 mg via ORAL
  Filled 2023-12-10: qty 1

## 2023-12-10 MED ORDER — MORPHINE SULFATE (PF) 2 MG/ML IV SOLN
2.0000 mg | INTRAVENOUS | Status: DC | PRN
  Administered 2023-12-10 – 2023-12-12 (×9): 2 mg via INTRAVENOUS
  Filled 2023-12-10 (×9): qty 1

## 2023-12-10 MED ORDER — ONDANSETRON HCL 4 MG PO TABS: 4.0000 mg | ORAL_TABLET | Freq: Four times a day (QID) | ORAL | Status: DC | PRN

## 2023-12-10 MED ORDER — THIAMINE HCL 100 MG/ML IJ SOLN
100.0000 mg | Freq: Every day | INTRAMUSCULAR | Status: DC
  Administered 2023-12-11: 100 mg via INTRAVENOUS
  Filled 2023-12-10 (×2): qty 2

## 2023-12-10 MED ORDER — FOLIC ACID 1 MG PO TABS
1.0000 mg | ORAL_TABLET | Freq: Every day | ORAL | Status: DC
  Administered 2023-12-10 – 2023-12-15 (×6): 1 mg via ORAL
  Filled 2023-12-10 (×6): qty 1

## 2023-12-10 MED ORDER — ENOXAPARIN SODIUM 40 MG/0.4ML IJ SOSY
40.0000 mg | PREFILLED_SYRINGE | INTRAMUSCULAR | Status: DC
  Filled 2023-12-10 (×4): qty 0.4

## 2023-12-10 MED ORDER — LACTATED RINGERS IV SOLN: INTRAVENOUS | Status: AC

## 2023-12-10 MED ORDER — HYDROMORPHONE HCL 1 MG/ML IJ SOLN
1.0000 mg | Freq: Once | INTRAMUSCULAR | Status: AC
  Administered 2023-12-10: 1 mg via INTRAVENOUS
  Filled 2023-12-10: qty 1

## 2023-12-10 MED ORDER — LORAZEPAM 1 MG PO TABS
1.0000 mg | ORAL_TABLET | ORAL | Status: AC | PRN
Start: 2023-12-10 — End: 2023-12-13
  Administered 2023-12-11: 3 mg via ORAL
  Filled 2023-12-10: qty 1
  Filled 2023-12-10: qty 3

## 2023-12-10 MED ORDER — ACETAMINOPHEN 650 MG RE SUPP: 650.0000 mg | Freq: Four times a day (QID) | RECTAL | Status: DC | PRN

## 2023-12-10 NOTE — Discharge Instructions (Addendum)
You were seen today for your known abdominal pain.  This is related to ongoing alcohol abuse.  If you do not stop drinking alcohol, you will continue to have pancreatic inflammation.

## 2023-12-10 NOTE — ED Triage Notes (Signed)
C/o generalized abd pain Pt recently admitted for pancreatitis and left AMA.  ETOH uses last night

## 2023-12-10 NOTE — ED Provider Notes (Signed)
[place] [facility]ERGENCY DEPARTMENT AT [facility] Provider Note  CSN: [id number] Arrival date & time: 12/10/23 1059  Chief Complaint(s) Abdominal Pain  HPI Gary [name] is a 38 y.o. male history of alcohol use disorder, HIV, recurrent alcohol induced pancreatitis, presenting to the emergency department with abdominal pain.  He reports epigastric abdominal pain.  Reports that radiates to both sides of his abdomen.  Feels similar to previous episodes of pancreatitis.  Reports he continues to drink alcohol, last drink this morning.  Reports associated nausea and vomiting.  No hematemesis, no melena.  No fevers or chills.  No urinary symptoms.   Past Medical History Past Medical History:  Diagnosis Date   Alcohol abuse    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   Anxiety    Bipolar 2 disorder (HCC)    HIV (human immunodeficiency virus infection) (HCC)    Pancreatitis    PTSD (post-traumatic stress disorder)    Schizophrenia (HCC)    Seizures (HCC)    Subdural hematoma (HCC)    Patient Active Problem List   Diagnosis Date Noted   Acute on chronic anemia 12/05/2023   Alcohol withdrawal (HCC) 11/26/2023   Seizure (HCC) 11/19/2023   Gastritis and duodenitis 11/19/2023   Pneumonia 11/06/2023   Nausea and vomiting 11/01/2023   Chronic pancreatitis (HCC) 10/16/2023   Acute on chronic pancreatitis (HCC) 10/16/2023   Recurrent acute pancreatitis 10/09/2023   Macrocytic anemia 09/26/2023   Alcoholic fatty liver 09/25/2023   Protein-calorie malnutrition, severe 12/25/2022   Hypomagnesemia 12/24/2022   Alcohol withdrawal syndrome without complication (HCC) 12/23/2022   Seizure disorder (HCC) 12/17/2022   Pancytopenia (HCC) 12/14/2022   Alcohol-induced pancreatitis 12/14/2022   Delirium tremens (HCC) 10/19/2022   Alcohol abuse with alcohol-induced mood disorder (HCC) 10/16/2022   Alcohol dependence with withdrawal (HCC) 10/16/2022   Abnormal LFTs 10/16/2022   Alcohol  induced acute pancreatitis 09/11/2022   AKI (acute kidney injury) (HCC) 05/28/2022   Homelessness 05/28/2022   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   Metabolic acidosis with increased anion gap and accumulation of organic acids 04/16/2022   Hypokalemia 01/22/2022   Constipation 07/30/2021   Alcohol-induced insomnia (HCC)    MDD (major depressive disorder), recurrent episode, 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/26/2021   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   Alcohol abuse    Malingering 05/15/2020   Malnutrition of moderate degree 05/07/2020   Alcoholic ketoacidosis 05/05/2020   Thrombocytopenia (HCC) 07/25/2019   Traumatic subdural hematoma (HCC) 07/02/2019   Alcoholic intoxication with complication (HCC) 07/02/2019   Hypocalcemia 07/02/2019   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   Alcohol use disorder, severe, dependence (HCC) 04/16/2019   Major depressive disorder, 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 Authorizing Provider  bisacodyl (DULCOLAX) 5 MG EC tablet Take 1 tablet (5 mg total) by mouth daily as needed for moderate constipation. 12/01/23  Yes Amin, Ankit C, MD  levETIRAcetam (KEPPRA) 500 MG tablet Take 1 tablet (500 mg total) by mouth 2 (two) times daily. 11/18/23  Yes Gunnar Bulla, MD  oxyCODONE (OXY IR/ROXICODONE) 5 MG immediate release tablet Take 1 tablet (5 mg total) by mouth every 6 (six) hours as needed for severe pain (pain score 7-10) or moderate pain (pain score 4-6). 12/01/23  Yes Amin, Ankit C, MD  sucralfate (CARAFATE) 1 g tablet Take  1 tablet (1 g total) by mouth 4 (four) times daily -  with meals and at bedtime. 12/10/23  Yes Horton, Mayer Masker, MD  bictegravir-emtricitabine-tenofovir AF (BIKTARVY) 50-200-25  MG TABS tablet Take 1 tablet by mouth daily. Patient not taking: Reported on 11/25/2023 11/16/23   Briant Cedar, MD  ondansetron (ZOFRAN-ODT) 4 MG disintegrating tablet Dissolve 1 tablet (4 mg total) by mouth every 8 (eight) hours as needed for nausea or vomiting. Patient not taking: Reported on 12/05/2023 12/01/23   [name], MD  ondansetron (ZOFRAN-ODT) 4 MG disintegrating tablet Dissolve 1 tablet (4 mg total) by mouth every 8 (eight) hours as needed for nausea or vomiting. Patient not taking: Reported on 12/05/2023 12/01/23   [name], MD  famotidine (PEPCID) 20 MG tablet Take 1 tablet (20 mg total) by mouth 2 (two) times daily. Patient not taking: Reported on 06/01/2019 05/14/19 06/02/19  Benjiman Core, MD                                                                                                                                    Past Surgical History Past Surgical History:  Procedure Laterality Date   BIOPSY  04/19/2022   Procedure: BIOPSY;  Surgeon: Meridee Score Netty Starring., MD;  [facility] END[facility]COPY;  Service: Gastroenterology;;   ENTEROSCOPY N/A 04/19/2022   Procedure: ENTEROSCOPY;  Surgeon: Meridee Score Netty Starring., MD;  Location: [facility] ENDOSCOPY;  Service: Gastroenterology;  Laterality: N/A;   INCISION AND DRAINAGE PERIRECTAL ABSCESS N/A 09/24/2016   Procedure: IRRIGATION AND DEBRIDEMENT PERIRECTAL ABSCESS;  Surgeon: Ricarda Frame, MD;  Location: [facility] ORS;  Service: General;  Laterality: N/A;   none     Family History Family History  Problem Relation Age of Onset   Alcohol abuse Mother    Alcohol abuse Father    Colon cancer Other    Other Other    Cancer Other     Social History Social History   Tobacco Use   Smoking status: Every Day    Current packs/day: 0.50    Average packs/day: 0.5 packs/day for 21.1 years (10.5 ttl pk-yrs)    Types: Cigarettes    Start date: 2004   Smokeless tobacco: Never   Tobacco comments:    unable to smoke while incarcerated  6+ months 02/13/20  Vaping Use   Vaping status: Never Used  Substance Use Topics   Alcohol use: Yes    Alcohol/week: 105.0 standard drinks of alcohol    Types: 105 Cans of beer per week    Comment: drinks every day, all day, "whatever I can get my hands on". "Half a gallon of vodka" 12/16   Drug use: Yes    Types: Methamphetamines, Cocaine    Comment: last used 10/22/2023   Allergies Tegretol [carbamazepine] and Caffeine  Review of Systems Review of Systems  All other systems reviewed and are negative.   Physical Exam Vital Signs  I have  reviewed the triage vital signs BP 130/89 (BP Location: Left Arm)   Pulse 82   Temp 98.5 F (36.9 C) (Oral)   Resp 20   Ht 6' (1.829 m)   Wt 54 kg   SpO2 100%   BMI 16.15 kg/m  Physical Exam Vitals and nursing note reviewed.  Constitutional:      General: He is not in acute distress.    Appearance: Normal appearance.  HENT:     Mouth/Throat:     Mouth: Mucous membranes are moist.  Eyes:     Conjunctiva/sclera: Conjunctivae normal.  Cardiovascular:     Rate and Rhythm: Normal rate and regular rhythm.  Pulmonary:     Effort: Pulmonary effort is normal. No respiratory distress.     Breath sounds: Normal breath sounds.  Abdominal:     General: Abdomen is flat.     Palpations: Abdomen is soft.     Tenderness: There is abdominal tenderness in the epigastric area.  Musculoskeletal:     Right lower leg: No edema.     Left lower leg: No edema.  Skin:    General: Skin is warm and dry.     Capillary Refill: Capillary refill takes less than 2 seconds.  Neurological:     Mental Status: He is alert and oriented to person, place, and time. Mental status is at baseline.  Psychiatric:        Mood and Affect: Mood normal.        Behavior: Behavior normal.     ED Results and Treatments Labs (all labs ordered are listed, but only abnormal results are displayed) Labs Reviewed  LIPASE, BLOOD - Abnormal; Notable for the following  components:      Result Value   Lipase 1,080 (*)    All other components within normal limits  COMPREHENSIVE METABOLIC PANEL - Abnormal; Notable for the following components:   Glucose, Bld 110 (*)    Creatinine, Ser 0.31 (*)    Calcium 8.7 (*)    All other components within normal limits  CBC - Abnormal; Notable for the following components:   RBC 3.43 (*)    Hemoglobin 10.4 (*)    HCT 32.0 (*)    RDW 16.5 (*)    All other components within normal limits  URINALYSIS, ROUTINE W REFLEX MICROSCOPIC - Abnormal; Notable for the following components:   Specific Gravity, Urine 1.031 (*)    Protein, ur 30 (*)    All other components within normal limits                                                                                                                          Radiology No results found.  Pertinent labs & imaging results that were available during my care of the patient were reviewed by me and considered in my medical decision making (see MDM for details).  Medications Ordered in ED Medications  LORazepam (ATIVAN) tablet 1-4 mg (has no administration in  time range)    Or  LORazepam (ATIVAN) injection 1-4 mg (has no administration in time range)  thiamine (VITAMIN B1) tablet 100 mg (has no administration in time range)    Or  thiamine (VITAMIN B1) injection 100 mg (has no administration in time range)  folic acid (FOLVITE) tablet 1 mg (has no administration in time range)  multivitamin with minerals tablet 1 tablet (has no administration in time range)  oxyCODONE (Oxy IR/ROXICODONE) immediate release tablet 5 mg (5 mg Oral Given 12/10/23 1736)  ondansetron (ZOFRAN-ODT) disintegrating tablet 8 mg (8 mg Oral Given 12/10/23 1735)  HYDROmorphone (DILAUDID) injection 1 mg (1 mg Intravenous Given 12/10/23 2116)  sodium chloride 0.9 % bolus 1,000 mL (1,000 mLs Intravenous New Bag/Given 12/10/23 2123)                                                                                                                                      Procedures Procedures  (including critical care time)  Medical Decision Making / ED Course   MDM:  38 year old presenting to the emergency department with abdominal pain.  Suspect ongoing symptoms from pancreatitis.  Has been evaluated numerous times for this previously.  Does not seem symptoms are significantly worse than previous so we will defer any further imaging.  Will check labs including lipase, LFTs.  Will give oral medication for symptoms.  Will reassess.  Clinical Course as of 12/10/23 2224  Fri Dec 10, 2023  2221 Patient's lipase is uptrending.  He reports his pain did not improve with oral medication and so did receive IV medicine with some improvement.  He now reports alcohol withdrawal symptoms with tremor.  He does have a mild tremor and tongue fasciculations.  Will start on CIWA.  Given worsening pancreatitis discussed with hospitalist Dr. Allena Katz who will admit the patient for further management of his pancreatitis and alcohol withdrawal. [WS]    Clinical Course User Index [WS] Suezanne Jacquet Jerilee Field, MD     Additional history obtained:  -External records from outside source obtained and reviewed including: Chart review including previous notes, labs, imaging, consultation notes including numerous prior visits for same    Lab Tests: -I ordered, reviewed, and interpreted labs.   The pertinent results include:   Labs Reviewed  LIPASE, BLOOD - Abnormal; Notable for the following components:      Result Value   Lipase 1,080 (*)    All other components within normal limits  COMPREHENSIVE METABOLIC PANEL - Abnormal; Notable for the following components:   Glucose, Bld 110 (*)    Creatinine, Ser 0.31 (*)    Calcium 8.7 (*)    All other components within normal limits  CBC - Abnormal; Notable for the following components:   RBC 3.43 (*)    Hemoglobin 10.4 (*)    HCT 32.0 (*)    RDW 16.5 (*)    All other components  within normal  limits  URINALYSIS, ROUTINE W REFLEX MICROSCOPIC - Abnormal; Notable for the following components:   Specific Gravity, Urine 1.031 (*)    Protein, ur 30 (*)    All other components within normal limits    Notable for elevated lipase     Medicines ordered and prescription drug management: Meds ordered this encounter  Medications   oxyCODONE (Oxy IR/ROXICODONE) immediate release tablet 5 mg    Refill:  0   ondansetron (ZOFRAN-ODT) disintegrating tablet 8 mg   HYDROmorphone (DILAUDID) injection 1 mg   sodium chloride 0.9 % bolus 1,000 mL   OR Linked Order Group    LORazepam (ATIVAN) tablet 1-4 mg     CIWA-AR < 5 =:   0 mg     CIWA-AR 5 -10 =:   1 mg     CIWA-AR 11 -15 =:   2 mg     CIWA-AR 16 -20 =:   3 mg     CIWA-AR 16 -20 =:   Recheck CIWA-AR in 1 hour; if > 20 notify MD     CIWA-AR > 20 =:   4 mg     CIWA-AR > 20 =:   Call Rapid Response    LORazepam (ATIVAN) injection 1-4 mg     CIWA-AR < 5 =:   0 mg     CIWA-AR 5 -10 =:   1 mg     CIWA-AR 11 -15 =:   2 mg     CIWA-AR 16 -20 =:   3 mg     CIWA-AR 16 -20 =:   Recheck CIWA-AR in 1 hour; if > 20 notify MD     CIWA-AR > 20 =:   4 mg     CIWA-AR > 20 =:   Call Rapid Response   OR Linked Order Group    thiamine (VITAMIN B1) tablet 100 mg    thiamine (VITAMIN B1) injection 100 mg   folic acid (FOLVITE) tablet 1 mg   multivitamin with minerals tablet 1 tablet    -I have reviewed the patients home medicines and have made adjustments as needed   Consultations Obtained: I requested consultation with the hospitalist,  and discussed lab and imaging findings as well as pertinent plan - they recommend: admission   Cardiac Monitoring: The patient was maintained on a cardiac monitor.  I personally viewed and interpreted the cardiac monitored which showed an underlying rhythm of: NSR  Social Determinants of Health:  Diagnosis or treatment significantly limited by social determinants of health: alcohol use and  homelessness   Reevaluation: After the interventions noted above, I reevaluated the patient and found that their symptoms have improved  Co morbidities that complicate the patient evaluation  Past Medical History:  Diagnosis Date   Alcohol abuse    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   Anxiety    Bipolar 2 disorder (HCC)    HIV (human immunodeficiency virus infection) (HCC)    Pancreatitis    PTSD (post-traumatic stress disorder)    Schizophrenia (HCC)    Seizures (HCC)    Subdural hematoma (HCC)       Dispostion: Disposition decision including need for hospitalization was considered, and patient admitted to the hospital.    Final Clinical Impression(s) / ED Diagnoses Final diagnoses:  Alcohol-induced acute pancreatitis, unspecified complication status     This chart was dictated using voice recognition software.  Despite best efforts to proofread,  errors can occur which can change the documentation meaning.    Bekka Qian,  Jerilee Field, MD 12/10/23 2224

## 2023-12-10 NOTE — ED Notes (Addendum)
Took call from [facility] lab. Pt HIV-1 RNA, Qualitative TMA resulted reactive. [name] MD informed. Pt happens to be in department again at this time.

## 2023-12-10 NOTE — H&P (Signed)
History and Physical    Gary [name] AOZ:[id number] DOB: 08-Sep-1986 DOA: 12/10/2023  PCP: Patient, No Pcp Per  Patient coming from: Homeless  I have personally briefly reviewed patient's old medical records in [facility]  Chief Complaint: Abdominal pain  HPI: Gary [name] is a 38 y.o. male with medical history significant for alcohol use disorder, recurrent alcohol induced pancreatitis, chronic pancreatic head pseudocyst, HIV (last CD4 598, quant RNA 90), history of seizures, normocytic anemia, polysubstance use, homelessness, frequent ED visits/admissions for alcohol-related issues who returned to the ED today for evaluation of abdominal pain associated with nausea and vomiting.  Patient states that he developed recurrent epigastric abdominal pain yesterday morning after he was drinking wine.  He had associated nausea and vomiting.  This has felt similar to his prior pancreatitis episodes.  Patient states that he usually will drink up to two fifths of vodka daily.  He has felt some early withdrawal symptoms since he has not had anything to drink since about 2 AM 1/31.  He says send him his are improving at time of admitting exam after he received some Ativan.  He is currently eating peanut butter without issue.  ED Course  Labs/Imaging on admission: I have personally reviewed following labs and imaging studies.  Initial vitals showed BP 106/75, pulse 88, RR 18, temp 98.4 F, SpO2 100% on room air.  Labs show lipase 1080, sodium 139, potassium 3.5, bicarb 24, BUN 10, creatinine 0.31, serum glucose 110, LFTs within normal limits, WBC 4.0, hemoglobin 10.4, platelets 20 13,000.  UA negative for UTI.  Patient was given 1 L normal saline, IV Dilaudid 1 mg, Oxy IR 5 mg and placed on CIWA protocol.  The hospitalist service was consulted to admit for further evaluation and management.  Review of Systems: All systems reviewed and are negative except as documented in history of present  illness above.   Past Medical History:  Diagnosis Date   Alcohol abuse    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   Anxiety    Bipolar 2 disorder (HCC)    HIV (human immunodeficiency virus infection) (HCC)    Pancreatitis    PTSD (post-traumatic stress disorder)    Schizophrenia (HCC)    Seizures (HCC)    Subdural hematoma (HCC)     Past Surgical History:  Procedure Laterality Date   BIOPSY  04/19/2022   Procedure: BIOPSY;  Surgeon: Lemar Lofty., MD;  Location: [facility] ENDOSCOPY;  Service: Gastroenterology;;   ENTEROSCOPY N/A 04/19/2022   Procedure: ENTEROSCOPY;  Surgeon: Lemar Lofty., MD;  Location: [facility] ENDOSCOPY;  Service: Gastroenterology;  Laterality: N/A;   INCISION AND DRAINAGE PERIRECTAL ABSCESS N/A 09/24/2016   Procedure: IRRIGATION AND DEBRIDEMENT PERIRECTAL ABSCESS;  Surgeon: Ricarda Frame, MD;  Location: [facility] ORS;  Service: General;  Laterality: N/A;   none      Social History:  reports that he has been smoking cigarettes. He started smoking about 21 years ago. He has a 10.5 pack-year smoking history. He has never used smokeless tobacco. He reports current alcohol use of about 105.0 standard drinks of alcohol per week. He reports current drug use. Drugs: Methamphetamines and Cocaine.  Allergies  Allergen Reactions   Tegretol [Carbamazepine] Other (See Comments)    Vertigo and paralysis   Caffeine Palpitations    Family History  Problem Relation Age of Onset   Alcohol abuse Mother    Alcohol abuse Father    Colon cancer Other    Other Other    Cancer  Other      Prior to Admission medications   Medication Sig Start Date End Date Taking? Authorizing Provider  bisacodyl (DULCOLAX) 5 MG EC tablet Take 1 tablet (5 mg total) by mouth daily as needed for moderate constipation. 12/01/23  Yes Amin, Ankit C, MD  levETIRAcetam (KEPPRA) 500 MG tablet Take 1 tablet (500 mg total) by mouth 2 (two) times daily. 11/18/23  Yes Gunnar Bulla, MD  oxyCODONE (OXY  IR/ROXICODONE) 5 MG immediate release tablet Take 1 tablet (5 mg total) by mouth every 6 (six) hours as needed for severe pain (pain score 7-10) or moderate pain (pain score 4-6). 12/01/23  Yes Amin, Ankit C, MD  sucralfate (CARAFATE) 1 g tablet Take 1 tablet (1 g total) by mouth 4 (four) times daily -  with meals and at bedtime. 12/10/23  Yes Horton, Mayer Masker, MD  bictegravir-emtricitabine-tenofovir AF (BIKTARVY) 50-200-25 MG TABS tablet Take 1 tablet by mouth daily. Patient not taking: Reported on 11/25/2023 11/16/23   Briant Cedar, MD  ondansetron (ZOFRAN-ODT) 4 MG disintegrating tablet Dissolve 1 tablet (4 mg total) by mouth every 8 (eight) hours as needed for nausea or vomiting. Patient not taking: Reported on 12/05/2023 12/01/23   [name], MD  ondansetron (ZOFRAN-ODT) 4 MG disintegrating tablet Dissolve 1 tablet (4 mg total) by mouth every 8 (eight) hours as needed for nausea or vomiting. Patient not taking: Reported on 12/05/2023 12/01/23   [name], MD  famotidine (PEPCID) 20 MG tablet Take 1 tablet (20 mg total) by mouth 2 (two) times daily. Patient not taking: Reported on 06/01/2019 05/14/19 06/02/19  Benjiman Core, MD    Physical Exam: Vitals:   12/10/23 2013 12/10/23 2352 12/10/23 2355 12/11/23 0002  BP: 130/89 (!) 96/55 99/66   Pulse: 82 87 83   Resp: 20     Temp: 98.5 F (36.9 C)   98 F (36.7 C)  TempSrc: Oral   Oral  SpO2: 100%  98%   Weight:      Height:       Constitutional: NAD, calm, comfortable Eyes: EOMI, lids and conjunctivae normal ENMT: Mucous membranes are dry. Posterior pharynx clear of any exudate or lesions.Normal dentition.  Neck: normal, supple, no masses. Respiratory: clear to auscultation bilaterally, no wheezing, no crackles. Normal respiratory effort. No accessory muscle use.  Cardiovascular: Regular rate and rhythm, no murmurs / rubs / gallops. No extremity edema. 2+ pedal pulses. Abdomen: Mild epigastric tenderness, no masses palpated.   Musculoskeletal: no clubbing / cyanosis. No joint deformity upper and lower extremities. Good ROM, no contractures. Normal muscle tone.  Skin: no rashes, lesions, ulcers. No induration Neurologic: Sensation intact. Strength 5/5 in all 4.  Psychiatric: Normal judgment and insight. Alert and oriented x 3. Normal mood.   EKG: Personally reviewed. Sinus tachycardia, rate 105.  Assessment/Plan Principal Problem:   Acute alcoholic pancreatitis Active Problems:   Alcohol use disorder, severe, dependence (HCC)   Alcohol dependence with withdrawal (HCC)   HIV (human immunodeficiency virus infection) (HCC)   Gary [name] is a 38 y.o. male with medical history significant for alcohol use disorder, recurrent alcohol induced pancreatitis, chronic pancreatic head pseudocyst, HIV (last CD4 598, quant RNA 90), history of seizures, normocytic anemia, polysubstance use, homelessness, frequent ED visits/admissions for alcohol-related issues who is admitted with recurrent acute alcohol associated pancreatitis.  Assessment and Plan: Recurrent alcohol associated acute pancreatitis Chronic pancreatic head pseudocyst: Lipase 1080.  Last CT A/P 12/05/2023 showed persistent inflammatory stranding around head  and uncinate process of pancreas, 1.2 cm pseudocyst in pancreatic head neck junction which was previously 1.4 cm. -Continue IV fluid hydration -Analgesics and antiemetics as needed -Advance diet as tolerated  Alcohol use disorder, severe with dependence and early withdrawal: Received Ativan in the ED with control of withdrawal symptoms. -Continue CIWA protocol with Ativan as needed -Continue MVM, folate, thiamine  HIV: Labs on 12/07/2023 showed HIV RNA 90, CD4 460.  Follows with [facility] ID and had been on [place] however on prior admission ID had recommended that this be held given noncompliance until he actually follows up in ID clinic.  History of seizures: Continue Keppra.  Normocytic  anemia: Hemoglobin stable at 10.4.   DVT prophylaxis: enoxaparin (LOVENOX) injection 40 mg Start: 12/11/23 1000 Code Status: Full code Family Communication: Discussed with patient Disposition Plan: Homeless Consults called: None Severity of Illness: The appropriate patient status for this patient is INPATIENT. Inpatient status is judged to be reasonable and necessary in order to provide the required intensity of service to ensure the patient's safety. The patient's presenting symptoms, physical exam findings, and initial radiographic and laboratory data in the context of their chronic comorbidities is felt to place them at high risk for further clinical deterioration. Furthermore, it is not anticipated that the patient will be medically stable for discharge from the hospital within 2 midnights of admission.   * I certify that at the point of admission it is my clinical judgment that the patient will require inpatient hospital care spanning beyond 2 midnights from the point of admission due to high intensity of service, high risk for further deterioration and high frequency of surveillance required.Darreld Mclean MD [facility]  If 7PM-7AM, please contact night-coverage [email or web address]  12/11/2023, 12:52 AM

## 2023-12-10 NOTE — ED Notes (Signed)
Patient stated that he did not want to try the PO pain medication at this time due to nausea.

## 2023-12-10 NOTE — ED Notes (Signed)
Called ED pharmacist regarding infiltration of patient's IV, pharmacist's recommendations is cold or warm compress with elevation of the limb.

## 2023-12-10 NOTE — ED Provider Notes (Signed)
[facility] Provider Note   CSN: [id number] Arrival date & time: 12/09/23  1931     History  Chief Complaint  Patient presents with   Abdominal Pain    Gary [name] is a 38 y.o. male.  HPI     This is a 38 year old male well-known to our emergency department who presents with ongoing abdominal pain.  Was just discharged from the hospital 2 days ago.  Has known recurrent alcoholic pancreatitis.  Has had 30 ED visits and 14 inpatient visits in the last 6 months.  He frequently will be discharged and continue to drink.  He states he did drink upon discharge 2 days ago.  He is complaining of upper abdominal discomfort.  Denies nausea or vomiting.  Denies change in bowels.  Chart reviewed.  At discharge, patient was tolerating orals.  There was concern that the patient may have had some drug-seeking behavior as he was demanding IV Dilaudid prior to demanding to leave.  Home Medications Prior to Admission medications   Medication Sig Start Date End Date Taking? Authorizing Provider  bictegravir-emtricitabine-tenofovir AF (BIKTARVY) 50-200-25 MG TABS tablet Take 1 tablet by mouth daily. Patient not taking: Reported on 11/25/2023 11/16/23   Briant Cedar, MD  bisacodyl (DULCOLAX) 5 MG EC tablet Take 1 tablet (5 mg total) by mouth daily as needed for moderate constipation. 12/01/23   Amin, Ankit C, MD  levETIRAcetam (KEPPRA) 500 MG tablet Take 1 tablet (500 mg total) by mouth 2 (two) times daily. 11/18/23   Gunnar Bulla, MD  ondansetron (ZOFRAN-ODT) 4 MG disintegrating tablet Dissolve 1 tablet (4 mg total) by mouth every 8 (eight) hours as needed for nausea or vomiting. Patient not taking: Reported on 12/05/2023 12/01/23   [name], MD  ondansetron (ZOFRAN-ODT) 4 MG disintegrating tablet Dissolve 1 tablet (4 mg total) by mouth every 8 (eight) hours as needed for nausea or vomiting. Patient not taking: Reported on 12/05/2023 12/01/23   [name], MD  oxyCODONE (OXY IR/ROXICODONE) 5 MG immediate release tablet Take 1 tablet (5 mg total) by mouth every 6 (six) hours as needed for severe pain (pain score 7-10) or moderate pain (pain score 4-6). 12/01/23   Amin, Ankit C, MD  famotidine (PEPCID) 20 MG tablet Take 1 tablet (20 mg total) by mouth 2 (two) times daily. Patient not taking: Reported on 06/01/2019 05/14/19 06/02/19  Benjiman Core, MD      Allergies    Tegretol [carbamazepine] and Caffeine    Review of Systems   Review of Systems  Cardiovascular:  Negative for chest pain.  Gastrointestinal:  Positive for abdominal pain and nausea. Negative for vomiting.  All other systems reviewed and are negative.   Physical Exam Updated Vital Signs BP 105/72   Pulse 93   Temp 98.3 F (36.8 C)   Resp 16   SpO2 99%  Physical Exam Vitals and nursing note reviewed.  Constitutional:      Appearance: He is well-developed. He is not ill-appearing.  HENT:     Head: Normocephalic and atraumatic.  Eyes:     Pupils: Pupils are equal, round, and reactive to light.  Cardiovascular:     Rate and Rhythm: Normal rate and regular rhythm.  Pulmonary:     Effort: Pulmonary effort is normal. No respiratory distress.  Abdominal:     General: Bowel sounds are normal.     Palpations: Abdomen is soft.     Tenderness: There  is abdominal tenderness in the epigastric area. There is no rebound.  Musculoskeletal:     Cervical back: Neck supple.  Lymphadenopathy:     Cervical: No cervical adenopathy.  Skin:    General: Skin is warm and dry.  Neurological:     Mental Status: He is alert and oriented to person, place, and time.     ED Results / Procedures / Treatments   Labs (all labs ordered are listed, but only abnormal results are displayed) Labs Reviewed  COMPREHENSIVE METABOLIC PANEL - Abnormal; Notable for the following components:      Result Value   Glucose, Bld 113 (*)    All other components within normal limits  CBC WITH  DIFFERENTIAL/PLATELET - Abnormal; Notable for the following components:   RBC 3.39 (*)    Hemoglobin 10.2 (*)    HCT 30.8 (*)    RDW 16.0 (*)    All other components within normal limits  LIPASE, BLOOD - Abnormal; Notable for the following components:   Lipase 347 (*)    All other components within normal limits    EKG None  Radiology No results found.  Procedures Procedures    Medications Ordered in ED Medications  oxyCODONE-acetaminophen (PERCOCET/ROXICET) 5-325 MG per tablet 1 tablet (has no administration in time range)  ondansetron (ZOFRAN) injection 4 mg (has no administration in time range)  sodium chloride 0.9 % bolus 1,000 mL (has no administration in time range)    ED Course/ Medical Decision Making/ A&P Clinical Course as of 12/10/23 2336  [name] Dec 10, 2023  0981 Patient with recent CT on 1/26 with persistent pancreatic inflammation and pseudocyst that had shrunk in size. [CH]    Clinical Course User Index [CH] Jaiyah Beining, Mayer Masker, MD                                 Medical Decision Making Risk Prescription drug management.   This patient presents to the ED for concern of abdominal pain, this involves an extensive number of treatment options, and is a complaint that carries with it a high risk of complications and morbidity.  I considered the following differential and admission for this acute, potentially life threatening condition.  The differential diagnosis includes acute on chronic pancreatitis, gastritis, dehydration, complication such as pseudocyst  MDM:    This is a 38 year old male who presents with ongoing and worsening abdominal pain.  He continues to drink.  Was just discharged from the hospital less than 48 hours ago after being admitted for several days.  At that time he was tolerating p.o.  He has since continued to drink.  He appears at his baseline.  I have evaluated him multiple times.  Labs are notable for a lipase of 347.  Most recent lipase  3 days ago 107.  Likely continues to have an element of ongoing pancreatitis secondary to alcohol abuse and use.  I have had multiple discussions with the patient both on this admission and multiple prior evaluations regarding his ongoing alcohol use as culprit.  He states that it is because he is having so much pain that he has to drink.  Offered him resources for alcohol cessation.  Given his multiple ED visits and recurrent admissions for the same as well as some behaviors suggestive of drug-seeking behavior, will try to avoid IV pain medication.  He was given fluids and p.o. pain medications as well as Zofran.  Recently had a CT scan that did not show any pancreatic complications.  At this time we will discharge and have encouraged him to stop drinking.  Will discharge with Carafate.  (Labs, imaging, consults)  Labs: I Ordered, and personally interpreted labs.  The pertinent results include: CBC, CMP, lipase  Imaging Studies ordered: I ordered imaging studies including none I independently visualized and interpreted imaging. I agree with the radiologist interpretation  Additional history obtained from chart review.  External records from outside source obtained and reviewed including recent discharge summary  Cardiac Monitoring: The patient was maintained on a cardiac monitor.  If on the cardiac monitor, I personally viewed and interpreted the cardiac monitored which showed an underlying rhythm of: Sinus  Reevaluation: After the interventions noted above, I reevaluated the patient and found that they have :stayed the same  Social Determinants of Health:  housing instability  Disposition: Discharge  Co morbidities that complicate the patient evaluation  Past Medical History:  Diagnosis Date   Alcohol abuse    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   Anxiety    Bipolar 2 disorder (HCC)    HIV (human immunodeficiency virus infection) (HCC)    Pancreatitis    PTSD (post-traumatic  stress disorder)    Schizophrenia (HCC)    Seizures (HCC)    Subdural hematoma (HCC)      Medicines Meds ordered this encounter  Medications   oxyCODONE-acetaminophen (PERCOCET/ROXICET) 5-325 MG per tablet 1 tablet    Refill:  0   ondansetron (ZOFRAN) injection 4 mg   sodium chloride 0.9 % bolus 1,000 mL   sucralfate (CARAFATE) 1 g tablet    Sig: Take 1 tablet (1 g total) by mouth 4 (four) times daily -  with meals and at bedtime.    Dispense:  120 tablet    Refill:  0    I have reviewed the patients home medicines and have made adjustments as needed  Problem List / ED Course: Problem List Items Addressed This Visit       Digestive   Chronic pancreatitis (HCC) - Primary   Relevant Medications   sucralfate (CARAFATE) 1 g tablet                Final Clinical Impression(s) / ED Diagnoses Final diagnoses:  Alcohol-induced chronic pancreatitis [facility])    Rx / DC Orders ED Discharge Orders     None         Shon Baton, MD 12/10/23 2340

## 2023-12-11 ENCOUNTER — Encounter (HOSPITAL_COMMUNITY): Payer: Self-pay | Admitting: Internal Medicine

## 2023-12-11 DIAGNOSIS — K852 Alcohol induced acute pancreatitis without necrosis or infection: Secondary | ICD-10-CM | POA: Diagnosis not present

## 2023-12-11 LAB — COMPREHENSIVE METABOLIC PANEL
ALT: 12 U/L (ref 0–44)
AST: 16 U/L (ref 15–41)
Albumin: 3 g/dL — ABNORMAL LOW (ref 3.5–5.0)
Alkaline Phosphatase: 46 U/L (ref 38–126)
Anion gap: 4 — ABNORMAL LOW (ref 5–15)
BUN: 11 mg/dL (ref 6–20)
CO2: 27 mmol/L (ref 22–32)
Calcium: 8.2 mg/dL — ABNORMAL LOW (ref 8.9–10.3)
Chloride: 106 mmol/L (ref 98–111)
Creatinine, Ser: 0.49 mg/dL — ABNORMAL LOW (ref 0.61–1.24)
GFR, Estimated: >60 mL/min (ref >60)
Glucose, Bld: 107 mg/dL — ABNORMAL HIGH (ref 70–99)
Potassium: 3.7 mmol/L (ref 3.5–5.1)
Sodium: 137 mmol/L (ref 135–145)
Total Bilirubin: 0.7 mg/dL (ref 0.0–1.2)
Total Protein: 5.8 g/dL — ABNORMAL LOW (ref 6.5–8.1)

## 2023-12-11 LAB — CBC
HCT: 28.2 % — ABNORMAL LOW (ref 39.0–52.0)
Hemoglobin: 9.3 g/dL — ABNORMAL LOW (ref 13.0–17.0)
MCH: 31.4 pg (ref 26.0–34.0)
MCHC: 33 g/dL (ref 30.0–36.0)
MCV: 95.3 fL (ref 80.0–100.0)
Platelets: 191 10*3/uL (ref 150–400)
RBC: 2.96 MIL/uL — ABNORMAL LOW (ref 4.22–5.81)
RDW: 16.5 % — ABNORMAL HIGH (ref 11.5–15.5)
WBC: 3.9 10*3/uL — ABNORMAL LOW (ref 4.0–10.5)
nRBC: 0 % (ref 0.0–0.2)

## 2023-12-11 LAB — RAPID URINE DRUG SCREEN, HOSP PERFORMED
Amphetamines: NOT DETECTED
Barbiturates: NOT DETECTED
Benzodiazepines: POSITIVE — AB
Cocaine: NOT DETECTED
Opiates: NOT DETECTED
Tetrahydrocannabinol: NOT DETECTED

## 2023-12-11 LAB — ETHANOL: Alcohol, Ethyl (B): <10 mg/dL (ref <10)

## 2023-12-11 NOTE — ED Notes (Signed)
Adjusted rate on pt's LR infusion due to pt's blood pressure  Rate adjustment of 100 to 365mL/hr

## 2023-12-11 NOTE — Plan of Care (Signed)
  Problem: Clinical Measurements: Goal: Will remain free from infection Outcome: Progressing   Problem: Clinical Measurements: Goal: Respiratory complications will improve Outcome: Progressing   Problem: Clinical Measurements: Goal: Cardiovascular complication will be avoided Outcome: Progressing   Problem: Activity: Goal: Risk for activity intolerance will decrease Outcome: Progressing   

## 2023-12-11 NOTE — Hospital Course (Signed)
Gary [name] is a 38 y.o. male with medical history significant for alcohol use disorder, recurrent alcohol induced pancreatitis, chronic pancreatic head pseudocyst, HIV (last CD4 598, quant RNA 90), history of seizures, normocytic anemia, polysubstance use, homelessness, frequent ED visits/admissions for alcohol-related issues who is admitted with recurrent acute alcohol associated pancreatitis.

## 2023-12-11 NOTE — Progress Notes (Signed)
During admission assessment, patient verbalizes complaints of "pancreatic" pain that was affecting his ability to stand erect. Additionally, patient stated "I am going through withdrawal due to alcohol". Pain and CIWA assessments were completed. Offered patient IV Morphine 2 mg for pain score - 10/10 and PO Ativan 3 mg for CIWA score - 18. Patient was also offered IV Zofran 4 mg for complaint of nausea but refused stating "the Ativan usually takes care of it". Patient welcomed IV Morphine but immediately requested IV Ativan stating "the pills do not work".  Patient then stated, "I could seize if I am not medicated appropriately". Advised patient that PO Ativan would be the 1st course of action and IV Ativan is ordered if he is unable to tolerate PO. Shortly after leaving room, I was notified by NT that patient was laying over the side of the bed stating he felt nauseated. Shortly, thereafter, Clinical research associate was notified by Licensed conveyancer that patient had called stating he had thrown up his pills. Upon entering room, writer offered patient IV Zofran 4 mg. Patient asked writer repeatedly when he could get IV Ativan. Writer administered IV Zofran 4 mg as ordered and advised patient that attending MD would be notified of his concerns with PO Ativan.

## 2023-12-11 NOTE — Progress Notes (Signed)
[facility] PROGRESS NOTE  Gary [name] (DOB: 09-11-1986) ZOX:[id number] PCP: Patient, No Pcp Per  Brief Narrative: Gary [name] is a 38 y.o. male with a history of HIV disease (last CD4 598, VL 90), seizure disorder, EtOH abuse, recurrent alcohol-associated pancreatitis, bipolar disorder, homelessness who presented to the ED on 12/10/2023 with abdominal pain. Lipase elevated. Admitted for recurrent pancreatitis and EtOH abuse with anticiipated withdrawal.   Note frequent readmissions and several instances of leaving AMA. Since 09/24/2023 he has been in the ED or admitted every day EXCEPT 12/02/2023, 12/20-12/21, 12/12-14.   Subjective: Pain severe, requesting IV pain medications, some tremor.   Objective: BP (!) 87/57 (BP Location: Left Arm)   Pulse 77   Temp 97.6 F (36.4 C) (Oral)   Resp 18   Ht 6' (1.829 m)   Wt 54 kg   SpO2 100%   BMI 16.15 kg/m   Gen: No distress, thin Pulm: Clear, nonlabored  CV: RRR, no tachycardia, no MRG or pitting edema GI: Soft, minimally tender when distracted, nondistended, +BS Neuro: Alert and oriented. No new focal deficits. Tremor (intentional vs. nonintentional) without asterixis. Ext: Warm, no deformities. Skin: No open wounds on visualized skin   Assessment & Plan: Acute recurrent alcohol-associated pancreatitis: No peritonitis by exam. H&P states he was eating peanut butter without issues.  - Will continue NPO and continue IVF. Once he feels ready to start po challenge, we will know he's ready to switch to oral analgesic/antiemetic alone.  - As discussed above, he is frequently readmitted, particularly during the winter, one discharge summary mentioned that he remains a high risk of readmission and will often be found eating cheeseburgers while reporting severe pancreatitis-related pain.     Alcohol abuse and withdrawal: Last drink said to be 1/31 at 2am.  - Continue CIWA. His vital signs and exam do not suggest severe withdrawal at  this time, though he has had instances requiring ICU level of care.   - Continue folic acid, thiamine, MVM   Seizure disorder:  - Continue keppra   HIV disease:  - Not adherent to ART regularly, so not restarted on biktarvy while admitted per ID recommendations.    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 Near baseline. - Monitoring with typical transfusion thresholds.    [name], MD [facility] [email or web address] 12/11/2023, 9:00 AM

## 2023-12-11 NOTE — ED Notes (Signed)
Nurse was aware of the bp being low

## 2023-12-11 NOTE — ED Notes (Signed)
Attempted to contact floor to advise pt is coming up

## 2023-12-12 DIAGNOSIS — K852 Alcohol induced acute pancreatitis without necrosis or infection: Secondary | ICD-10-CM | POA: Diagnosis not present

## 2023-12-12 MED ORDER — OXYCODONE HCL 5 MG PO TABS
5.0000 mg | ORAL_TABLET | ORAL | Status: DC | PRN
  Administered 2023-12-12 (×3): 10 mg via ORAL
  Administered 2023-12-12: 5 mg via ORAL
  Administered 2023-12-13 – 2023-12-15 (×13): 10 mg via ORAL
  Filled 2023-12-12 (×18): qty 2

## 2023-12-12 MED ORDER — LACTATED RINGERS IV SOLN: INTRAVENOUS | Status: DC

## 2023-12-12 MED ORDER — BOOST / RESOURCE BREEZE PO LIQD CUSTOM
1.0000 | Freq: Three times a day (TID) | ORAL | Status: DC
  Administered 2023-12-12 – 2023-12-15 (×6): 1 via ORAL

## 2023-12-12 MED ORDER — PHENOBARBITAL 32.4 MG PO TABS: 32.4000 mg | ORAL_TABLET | Freq: Two times a day (BID) | ORAL | Status: DC

## 2023-12-12 MED ORDER — LEVETIRACETAM IN NACL 500 MG/100ML IV SOLN
500.0000 mg | Freq: Two times a day (BID) | INTRAVENOUS | Status: DC
  Administered 2023-12-12 – 2023-12-15 (×6): 500 mg via INTRAVENOUS
  Filled 2023-12-12 (×6): qty 100

## 2023-12-12 MED ORDER — MELATONIN 5 MG PO TABS
5.0000 mg | ORAL_TABLET | Freq: Every evening | ORAL | Status: AC | PRN
  Administered 2023-12-13 – 2023-12-14 (×3): 5 mg via ORAL
  Filled 2023-12-12 (×3): qty 1

## 2023-12-12 NOTE — Progress Notes (Signed)
[facility] PROGRESS NOTE  Gary [name] (DOB: 08-13-1986) ZOX:[id number] PCP: Patient, No Pcp Per  Brief Narrative: Gary [name] is a 38 y.o. male with a history of HIV disease (last CD4 598, VL 90), seizure disorder, EtOH abuse, recurrent alcohol-associated pancreatitis, bipolar disorder, homelessness who presented to the ED on 12/10/2023 with abdominal pain. Lipase elevated. Admitted for recurrent pancreatitis and EtOH abuse with anticiipated withdrawal.   Note frequent readmissions and several instances of leaving AMA. Since 09/24/2023 he has been in the ED or admitted every day EXCEPT 12/02/2023, 12/20-12/21, 12/12-14.   Subjective: Largely unchanged, he's receiving frequent ativan IV and morphine IV, says the pain remains severe and was initially tripped off by some fried chicken eaten PTA.  Objective: BP (!) 100/56 (BP Location: Left Arm)   Pulse (!) 48   Temp 97.9 F (36.6 C) (Oral)   Resp 12   Ht 6' (1.829 m)   Wt 54 kg   SpO2 100%   BMI 16.15 kg/m   Gen: No distress, chronically ill-appearing Pulm: Clear, nonlabored  CV: RRR, bradycardia, no MRG or edema GI: Soft, epigastric tenderness without rebound, nondistended, +BS Neuro: Alert and oriented. No new focal deficits. Ext: Warm, no deformities. Skin: N open wounds on visualized skin   Assessment & Plan: Acute recurrent alcohol-associated pancreatitis: No peritonitis by exam. H&P states he was eating peanut butter without issues.  - Will continue NPO and continue IVF. Once he feels ready to start po challenge, we will know he's ready to switch to oral analgesic/antiemetic alone.  - As discussed above, he is frequently readmitted, particularly during the winter, one discharge summary mentioned that he remains a high risk of readmission and will often be found eating cheeseburgers while reporting severe pancreatitis-related pain.     Alcohol abuse and withdrawal: Last drink said to be 1/31 at 2am.  - Continue  CIWA. His vital signs and exam do not suggest severe withdrawal at this time, though he has had instances requiring ICU level of care. Remain in PCU for now and will start phenobarb if developing objective findings of severe withdrawal.  - Continue folic acid, thiamine, MVM   Seizure disorder:  - Continue keppra, change to IV for now.   HIV disease:  - Not adherent to ART regularly, so not restarted on biktarvy while admitted per ID recommendations.    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 Near baseline. - Monitoring with typical transfusion thresholds.   Bradycardia: Chronic, BP remains soft but adequate.   Gary Nine, MD [email or web address] 12/12/2023, 11:13 AM

## 2023-12-13 DIAGNOSIS — K852 Alcohol induced acute pancreatitis without necrosis or infection: Secondary | ICD-10-CM | POA: Diagnosis not present

## 2023-12-13 MED ORDER — ALUM & MAG HYDROXIDE-SIMETH 200-200-20 MG/5ML PO SUSP
30.0000 mL | ORAL | Status: DC | PRN
  Administered 2023-12-13: 30 mL via ORAL
  Filled 2023-12-13: qty 30

## 2023-12-13 NOTE — Progress Notes (Signed)
[facility] PROGRESS NOTE  EMORY GALLENTINE (DOB: 1985/12/15) QMV:[id number] PCP: Patient, No Pcp Per  Brief Narrative: Gary [name] is a 38 y.o. male with a history of HIV disease (last CD4 598, VL 90), seizure disorder, EtOH abuse, recurrent alcohol-associated pancreatitis, bipolar disorder, homelessness who presented to the ED on 12/10/2023 with abdominal pain. Lipase elevated. Admitted for recurrent pancreatitis and EtOH abuse with anticiipated withdrawal.   Note frequent readmissions and several instances of leaving AMA. Since 09/24/2023 he has been in the ED or admitted every day EXCEPT 12/02/2023, 12/20-12/21, 12/12-14.   Subjective: Sleeping soundly this morning, says clear liquids were tolerated, requests advanced diet.   Objective: BP (!) 89/59 (BP Location: Right Arm)   Pulse 64   Temp 98.8 F (37.1 C) (Oral)   Resp 16   Ht 6' (1.829 m)   Wt 54 kg   SpO2 97%   BMI 16.15 kg/m   Gen: Thin male in no distress Pulm: Clear, nonlabored  CV: RRR, no MRG GI: Soft, modestly tender in epigastrium, ND, +BS Neuro: Alert and oriented. No new focal deficits. Ext: Warm, no deformities. Skin: No open wounds on visualized skin   Assessment & Plan: Acute recurrent alcohol-associated pancreatitis: No peritonitis by exam. H&P states he was eating peanut butter without issues.  - Clears > fulls today. Advance as tolerated. Our options are, if not tolerated, return to NPO and consider cortrak or, if tolerated, continue advancing diet and discharge in next 24 hours. Continue oral analgesic. - As discussed above, he is frequently readmitted, particularly during the winter, one discharge summary mentioned that he remains a high risk of readmission and will often be found eating cheeseburgers while reporting severe pancreatitis-related pain.     Alcohol abuse and withdrawal: Last drink said to be 1/31 at 2am.  - Continue CIWA. His vital signs and exam do not suggest severe withdrawal at  this time and he is now likely outside window for severe withdrawal.   - Continue folic acid, thiamine, MVM   Seizure disorder:  - Continue keppra   HIV disease:  - Not adherent to ART regularly, so not restarted on biktarvy while admitted per ID recommendations.    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 Near baseline. - Monitoring with typical transfusion thresholds.   Bradycardia: Chronic, BP remains soft but adequate.   [name], MD [email or web address] 12/13/2023, 8:50 AM

## 2023-12-13 NOTE — Plan of Care (Signed)

## 2023-12-13 NOTE — Discharge Summary (Signed)
Physician Discharge Summary   Patient: Gary [name] MRN: [id number] DOB: 05/29/1986  Admit date:     12/10/2023  Discharge date: Left AMA 12/13/2023   Discharge Physician: [name]   PCP: Patient, No Pcp Per   Recommendations at discharge:  Alcohol cessation counseling Reestablish care for HIV disease with [facility]  Discharge Diagnoses: Principal Problem:   Acute alcoholic pancreatitis Active Problems:   Alcohol use disorder, severe, dependence (HCC)   Alcohol dependence with withdrawal (HCC)   HIV (human immunodeficiency virus infection) (HCC)   Brief Narrative: Gary [name] is a 38 y.o. male with a history of HIV disease (last CD4 598, VL 90), seizure disorder, EtOH abuse, recurrent alcohol-associated pancreatitis, bipolar disorder, homelessness who presented to the ED on 12/10/2023 with abdominal pain. Lipase elevated. Admitted for recurrent pancreatitis and EtOH abuse with anticipated withdrawal.    Note frequent readmissions and several instances of leaving AMA. Since 09/24/2023 he has been in the ED or admitted every day EXCEPT 12/02/2023, 12/20-12/21, 12/12-14.   We treated him for alcohol withdrawal with significant improvement. We had advanced his diet to full liquids with oxycodone support for >24 hours. On 2/3 his withdrawal symptoms are improved to the point that he may no longer require benzodiazepines but is requesting ativan 3mg  IV. When I return to discuss this with him once again he is laying somnolent in bed. As it was this morning his eyelids are drooping at times while we are talking. He is also stating that his abdominal pain is uncontrolled. He requests IV morphine. He does not want to revert his diet to clears or NPO. He's had no vomiting, abdominal exam is reassuring. When these requests were denied with thorough explanation of clinical rationale, he accepted the plan. I'm now told, however, he will be leaving this hospital. Progress note from earlier today is  copied below.    Subjective: Sleeping soundly this morning, says clear liquids were tolerated, requests advanced diet.    Objective: BP (!) 89/59 (BP Location: Right Arm)   Pulse 64   Temp 98.8 F (37.1 C) (Oral)   Resp 16   Ht 6' (1.829 m)   Wt 54 kg   SpO2 97%   BMI 16.15 kg/m   Gen: Thin male in no distress Pulm: Clear, nonlabored  CV: RRR, no MRG GI: Soft, modestly tender in epigastrium, ND, +BS Neuro: Alert and oriented. No new focal deficits. Ext: Warm, no deformities. Skin: No open wounds on visualized skin    Assessment & Plan: Acute recurrent alcohol-associated pancreatitis: No peritonitis by exam. H&P states he was eating peanut butter without issues.  - Clears > fulls today. Advance as tolerated. Our options are, if not tolerated, return to NPO and consider cortrak or, if tolerated, continue advancing diet and discharge in next 24 hours. Continue oral analgesic. - As discussed above, he is frequently readmitted, particularly during the winter, one discharge summary mentioned that he remains a high risk of readmission and will often be found eating cheeseburgers while reporting severe pancreatitis-related pain.     Alcohol abuse and withdrawal: Last drink said to be 1/31 at 2am.  - Continue CIWA. His vital signs and exam do not suggest severe withdrawal at this time and he is now likely outside window for severe withdrawal.   - Continue folic acid, thiamine, MVM   Seizure disorder:  - Continue keppra    HIV disease:  - Not adherent to ART regularly, so not restarted on biktarvy while  admitted per ID recommendations.    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 Near baseline. - Monitoring with typical transfusion thresholds.    Bradycardia: Chronic, BP remains soft but adequate. Condition at discharge: Patient has decision making capacity, not yet having advanced diet adequately, it is not medically advised that he  discharge at this time.  The results of significant diagnostics from this hospitalization (including imaging, microbiology, ancillary and laboratory) are listed below for reference.   Imaging Studies: CT ABDOMEN PELVIS W CONTRAST Result Date: 12/05/2023 CLINICAL DATA:  Acute severe pancreatitis with abdominal pain. EXAM: CT ABDOMEN AND PELVIS WITH CONTRAST TECHNIQUE: Multidetector CT imaging of the abdomen and pelvis was performed using the standard protocol following bolus administration of intravenous contrast. RADIATION DOSE REDUCTION: This exam was performed according to the departmental dose-optimization program which includes automated exposure control, adjustment of the mA and/or kV according to patient size and/or use of iterative reconstruction technique. CONTRAST:  75mL OMNIPAQUE IOHEXOL 350 MG/ML SOLN COMPARISON:  Multiple prior CTs since 2017. The 2 most recent are both with contrast and dated 11/25/2023 and 11/18/2023. 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 Stable appearance of overlying atelectasis in the left lower lobe. Lung bases are otherwise clear. The cardiac size is normal. There is scattered two-vessel coronary calcific plaque in the LAD and right coronary arteries but this is unusual in a 38 year old and age-advanced. Consider risk factor modification unless already being done. Hepatobiliary: No focal liver abnormality is seen. No gallstones, gallbladder wall thickening, or biliary dilatation. The liver is mildly steatotic. Pancreas: Small pancreas with truncated tail section. Inflammatory stranding continues to be seen about the head and uncinate process with small amount of peripancreatic fluid in the area but less fluid than previously. Again noted is a 1.2 cm pseudocyst, previously 1.4 cm in the pancreatic head and neck junction. No other localizing collection is seen. The overall appearance is similar to  11/18/2023, as above with decreased peripancreatic fluid since 11/24/2018. There is no mass enhancement.  No focal necrosis is seen. Spleen: Mildly prominent, measures 14 cm length. No mass enhancement. Adrenals/Urinary Tract: There is no adrenal mass. 8 mm Bosniak 2 cyst in the inferior pole of the right kidney is too small to characterize but unchanged. Both kidneys are otherwise homogeneous. There is no urinary stone or obstruction. The bladder is unremarkable. Stomach/Bowel: There are chronic thickened folds in the duodenum likely reactive. No bowel obstruction or inflammation. No evidence of appendicitis. Moderate fecal stasis. Vascular/Lymphatic: No significant vascular findings are present. No enlarged abdominal or pelvic lymph nodes. Reproductive: Enlarged prostate 5.1 cm transverse, also unusual at this age. Other: No pelvic free fluid. No free hemorrhage, free air or abscess. No incarcerated hernia. Musculoskeletal: Degenerative disc disease and endplate irregularities of the lower thoracic spine. Transitional anatomy lumbosacral junction. No acute or other significant osseous findings. IMPRESSION: 1. Persistent inflammatory stranding about the head and uncinate process of the pancreas with small amount of peripancreatic fluid but less fluid than previously. 2. 1.2 cm pseudocyst in the pancreatic head and neck junction, previously 1.4 cm. 3. No mass enhancement or focal necrosis. Truncated pancreatic tail. 4. Chronic thickened folds in the duodenum likely reactive. 5. Constipation. 6. Mild hepatic steatosis.  Mild splenomegaly. 7. Two-vessel coronary calcific plaque, unusual in a 38 year old and age-advanced. Consider risk factor modification unless already being done. 8. Enlarged prostate. 9. Chronic moderate elevation of  the left hemidiaphragm consistent with eventration or paresis. Electronically Signed   By: Almira Bar M.D.   On: 12/05/2023 05:42   CT ABDOMEN PELVIS W CONTRAST Result Date:  11/25/2023 CLINICAL DATA:  Upper abdominal pain EXAM: CT ABDOMEN AND PELVIS WITH CONTRAST TECHNIQUE: Multidetector CT imaging of the abdomen and pelvis was performed using the standard protocol following bolus administration of intravenous contrast. RADIATION DOSE REDUCTION: This exam was performed according to the departmental dose-optimization program which includes automated exposure control, adjustment of the mA and/or kV according to patient size and/or use of iterative reconstruction technique. CONTRAST:  OMNIPAQUE IOHEXOL 300 MG/ML  SOLN COMPARISON:  11/18/2023 FINDINGS: Lower chest: Stable mild elevation of the left hemidiaphragm. Hepatobiliary: Diffuse low-density throughout the liver compatible with fatty infiltration. No focal abnormality. Gallbladder unremarkable. Pancreas: Continued fluid/stranding around the pancreatic head and uncinate process. Truncation of the pancreatic tail. Mild pancreatic ductal dilatation. Focal fluid collection in the pancreatic head measuring 1.3 cm, unchanged. Areas of decreased perfusion in the pancreatic head are stable and may reflect necrosis. Spleen: No focal abnormality.  Normal size. Adrenals/Urinary Tract: No suspicious renal or adrenal lesion. No stones or hydronephrosis. Urinary bladder decompressed, unremarkable. Stomach/Bowel: Normal appendix. Stomach, large and small bowel grossly unremarkable. Vascular/Lymphatic: No evidence of aneurysm or adenopathy. Reproductive: No visible focal abnormality. Other: No free fluid or free air. Musculoskeletal: No acute bony abnormality. IMPRESSION: Similar changes of acute pancreatitis. 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 Stable areas of decreased perfusion in the pancreatic head which may reflect areas of necrosis. Hepatic steatosis. Electronically Signed   By: Charlett Nose M.D.   On: 11/25/2023 11:10   DG Chest 2 View Result Date: 11/25/2023 CLINICAL DATA:  Chest pain, upper abdominal pain EXAM: CHEST -  2 VIEW COMPARISON:  11/06/2023 FINDINGS: Heart and mediastinal contours are within normal limits. No focal opacities or effusions. No acute bony abnormality. IMPRESSION: No active cardiopulmonary disease. Electronically Signed   By: Charlett Nose M.D.   On: 11/25/2023 11:06   CT ABDOMEN PELVIS W CONTRAST Result Date: 11/18/2023 CLINICAL DATA:  Severe acute pancreatitis. Abdominal pain. History of alcoholic pancreatitis. EXAM: CT ABDOMEN AND PELVIS WITH CONTRAST TECHNIQUE: Multidetector CT imaging of the abdomen and pelvis was performed using the standard protocol following bolus administration of intravenous contrast. RADIATION DOSE REDUCTION: This exam was performed according to the departmental dose-optimization program which includes automated exposure control, adjustment of the mA and/or kV according to patient size and/or use of iterative reconstruction technique. CONTRAST:  OMNIPAQUE IOHEXOL 300 MG/ML  SOLN COMPARISON:  10/28/2023 FINDINGS: Lower chest: Left lower lobe atelectasis or pneumonia. Hepatobiliary: Hepatic steatosis. Unremarkable gallbladder and biliary tree. Pancreas: Pancreatic atrophy. Redemonstrated inflammatory stranding about the pancreatic head. The previous 2.1 cm pseudocyst in the uncinate process is not well visualized today. Unchanged 1.4 cm pseudocyst in the pancreatic head. Similar areas of poor enhancement in the pancreatic head and uncinate process suspicious for necrosis. No pancreatic ductal dilation. The overall appearance is similar to 10/28/2023. Spleen: Unremarkable. Adrenals/Urinary Tract: Normal adrenal glands. No urinary calculi or hydronephrosis. Unremarkable bladder. Stomach/Bowel: Normal caliber large and small bowel. Colonic wall thickening and mucosal hyperenhancement near the hepatic flexure likely reactive secondary to pancreatitis. Wall thickening of the duodenum and stomach with mucosal hyperenhancement is also favored reactive. Normal appendix.  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 Ill-defined filling defects in the superior mesenteric vein are favored due to mixing artifact. Thrombus is considered  less likely though not excluded without delayed phase. No lymphadenopathy. Normal caliber aorta. Reproductive: Unremarkable. Other: Peripancreatic free fluid and free fluid in the pelvis. No organized fluid collection or abscess. No free intraperitoneal air. Musculoskeletal: No acute fracture. IMPRESSION: 1. Similar findings of acute pancreatitis with areas of poor enhancement in the pancreatic head and uncinate process suspicious for necrosis. 2. Reactive duodenitis, gastritis, and colitis near the hepatic flexure. 3. 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 4. Left lower lobe atelectasis or pneumonia. 5. Hepatic steatosis. Electronically Signed   By: Minerva Fester M.D.   On: 11/18/2023 01:25    Microbiology: Results for orders placed or performed during the hospital encounter of 12/04/23  MRSA Next Gen by PCR, Nasal     Status: Abnormal   Collection Time: 12/06/23  9:06 PM   Specimen: Nasal Mucosa; Nasal Swab  Result Value Ref Range Status   MRSA by PCR Next Gen DETECTED (A) NOT DETECTED Final    Comment: RESULTS CALLED TO,READ BACK BY AND VERIFIED WITH RN Z.[email or web address] ON 12/06/23 AT 2350 BY NM (NOTE) The GeneXpert MRSA Assay (FDA approved for NASAL specimens only), is one component of a comprehensive MRSA colonization surveillance program. It is not intended to diagnose MRSA infection nor to guide or monitor treatment for MRSA infections. Test performance is not FDA approved in patients less than 42 years old. Performed at [facility], [address], [place], [place] [address]     Labs: CBC: Recent Labs  Lab 12/07/23 [phone] 12/08/23 0452 12/09/23 2000 12/10/23 1628 12/11/23 0500  WBC 3.9* 3.6* 4.2 4.0 3.9*  NEUTROABS 1.8 1.7 2.3  --   --   HGB 10.6*  9.6* 10.2* 10.4* 9.3*  HCT 31.3* 28.7* 30.8* 32.0* 28.2*  MCV 90.2 91.4 90.9 93.3 95.3  PLT 166 124* 218 213 191   Basic Metabolic Panel: Recent Labs  Lab 12/07/23 0700 12/08/23 0452 12/09/23 2000 12/10/23 1628 12/11/23 0500  NA 139 135 142 139 137  K 3.4* 4.1 3.7 3.5 3.7  CL 104 104 106 103 106  CO2 26 22 25 24 27   GLUCOSE 86 117* 113* 110* 107*  BUN <5* 10 9 10 11   CREATININE 0.60* 0.57* 0.64 0.31* 0.49*  CALCIUM 9.1 8.5* 9.1 8.7* 8.2*  MG 1.7 1.7  --   --   --   PHOS 4.6 4.9*  --   --   --    Liver Function Tests: Recent Labs  Lab 12/07/23 0700 12/08/23 0452 12/09/23 2000 12/10/23 1628 12/11/23 0500  AST 19 15 19 19 16   ALT 15 11 16 15 12   ALKPHOS 46 42 55 52 46  BILITOT 0.8 0.4 0.6 0.6 0.7  PROT 6.1* 5.3* 6.9 7.3 5.8*  ALBUMIN 3.3* 2.8* 3.7 3.9 3.0*   CBG: No results for input(s): "GLUCAP" in the last 168 hours.  Discharge time spent: greater than 30 minutes.  Signed: [name], MD [facility] 12/13/2023

## 2023-12-14 DIAGNOSIS — K852 Alcohol induced acute pancreatitis without necrosis or infection: Secondary | ICD-10-CM | POA: Diagnosis not present

## 2023-12-14 LAB — COMPREHENSIVE METABOLIC PANEL
ALT: 13 U/L (ref 0–44)
AST: 12 U/L — ABNORMAL LOW (ref 15–41)
Albumin: 3.3 g/dL — ABNORMAL LOW (ref 3.5–5.0)
Alkaline Phosphatase: 40 U/L (ref 38–126)
Anion gap: 7 (ref 5–15)
BUN: 8 mg/dL (ref 6–20)
CO2: 28 mmol/L (ref 22–32)
Calcium: 9.6 mg/dL (ref 8.9–10.3)
Chloride: 101 mmol/L (ref 98–111)
Creatinine, Ser: 0.45 mg/dL — ABNORMAL LOW (ref 0.61–1.24)
GFR, Estimated: >60 mL/min (ref >60)
Glucose, Bld: 106 mg/dL — ABNORMAL HIGH (ref 70–99)
Potassium: 4.3 mmol/L (ref 3.5–5.1)
Sodium: 136 mmol/L (ref 135–145)
Total Bilirubin: 0.5 mg/dL (ref 0.0–1.2)
Total Protein: 6.2 g/dL — ABNORMAL LOW (ref 6.5–8.1)

## 2023-12-14 LAB — CBC
HCT: 31.4 % — ABNORMAL LOW (ref 39.0–52.0)
Hemoglobin: 10.3 g/dL — ABNORMAL LOW (ref 13.0–17.0)
MCH: 30.6 pg (ref 26.0–34.0)
MCHC: 32.8 g/dL (ref 30.0–36.0)
MCV: 93.2 fL (ref 80.0–100.0)
Platelets: 199 10*3/uL (ref 150–400)
RBC: 3.37 MIL/uL — ABNORMAL LOW (ref 4.22–5.81)
RDW: 15.8 % — ABNORMAL HIGH (ref 11.5–15.5)
WBC: 4.3 10*3/uL (ref 4.0–10.5)
nRBC: 0 % (ref 0.0–0.2)

## 2023-12-14 NOTE — TOC CM/SW Note (Signed)
Patient admitted with recent discharge on 1/26, 1/22, 1/14, 1/7 and 1/1. Patient with history of homelessness. Resources provided each admit. Resources added to AVS.

## 2023-12-14 NOTE — Progress Notes (Signed)
 [facility] PROGRESS NOTE  Gary [name] (DOB: February 26, 1986) FMW:[id number] PCP: Patient, No Pcp Per  Brief Narrative: Gary [name] is a 38 y.o. male with a history of HIV disease (last CD4 598, VL 90), seizure disorder, EtOH abuse, recurrent alcohol -associated pancreatitis, bipolar disorder, homelessness who presented to the ED on 12/10/2023 with abdominal pain. Lipase elevated. Admitted for recurrent pancreatitis and EtOH abuse with anticiipated withdrawal.   Note frequent readmissions and several instances of leaving AMA. Since 09/24/2023 he has been in the ED or admitted every day EXCEPT 12/02/2023, 12/20-12/21, 12/12-14.   Subjective: Sleeping soundly, says the night went better and he feels a bit better. Wants to see how it goes with full liquids this morning, then advance diet to soft if well tolerated today. No ativan  required lately, 3 doses of oxycodone  over past 24 hours.  Objective: BP (!) 98/58 (BP Location: Right Arm)   Pulse (!) 59   Temp 98.2 F (36.8 C) (Oral)   Resp 15   Ht 6' (1.829 m)   Wt 54 kg   SpO2 98%   BMI 16.15 kg/m   Gen: Thin male in no distress Pulm: Clear, nonlabored  CV: Regular bradycardia, no edema or JVD laying supine GI: Soft, modestly tender (improved) in epigastrium without rebound or guarding. +BS Neuro: Alert and oriented. No new focal deficits. Ext: Warm, no deformities. Skin: No rashes, lesions or ulcers on visualized skin   Assessment & Plan: Acute recurrent alcohol -associated pancreatitis: No peritonitis by exam. H&P states he was eating peanut butter without issues.  - Continue fulls for breakfast, will place order to advance to soft / low fat if tolerated later in the day. Continue oral analgesic.    Alcohol  abuse and withdrawal: Last drink said to be 1/31 at 2am. Withdrawal symptoms have subsided, no longer requiring CIWA.  - Continue folic acid , thiamine , MVM   Seizure disorder:  - Continue keppra    HIV disease:  - Not  adherent to ART regularly, so not restarted on biktarvy  while admitted per ID recommendations.    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 Near baseline. - Monitoring with typical transfusion thresholds.   Bradycardia: Chronic, BP remains soft but adequate.   Bernardino KATHEE Come, MD [facility] [email or web address] 12/14/2023, 8:17 AM

## 2023-12-15 ENCOUNTER — Other Ambulatory Visit (HOSPITAL_COMMUNITY): Payer: Self-pay

## 2023-12-15 DIAGNOSIS — K852 Alcohol induced acute pancreatitis without necrosis or infection: Secondary | ICD-10-CM | POA: Diagnosis not present

## 2023-12-15 DIAGNOSIS — F1020 Alcohol dependence, uncomplicated: Secondary | ICD-10-CM

## 2023-12-15 DIAGNOSIS — F10232 Alcohol dependence with withdrawal with perceptual disturbance: Secondary | ICD-10-CM

## 2023-12-15 DIAGNOSIS — Z21 Asymptomatic human immunodeficiency virus [HIV] infection status: Secondary | ICD-10-CM | POA: Diagnosis not present

## 2023-12-15 MED ORDER — OXYCODONE HCL 5 MG PO TABS
5.0000 mg | ORAL_TABLET | Freq: Four times a day (QID) | ORAL | 0 refills | Status: DC | PRN
  Filled 2023-12-15: qty 12, 3d supply, fill #0

## 2023-12-15 NOTE — Discharge Summary (Signed)
 Physician Discharge Summary  Gary [name] FMW:[id number] DOB: 11-May-1986 DOA: 12/10/2023  PCP: Patient, No Pcp Per  Admit date: 12/10/2023 Discharge date: 12/15/2023  Admitted From: Home Disposition: Home  Recommendations for Outpatient Follow-up:  Follow up with PCP in 1-2 weeks  Discharge Condition: Stable CODE STATUS: Full Diet recommendation: Regular diet as tolerated  Brief/Interim Summary: Gary [name] is a 38 y.o. male with a history of HIV disease (last CD4 598, VL 90), seizure disorder, EtOH abuse, recurrent alcohol -associated pancreatitis, bipolar disorder, homelessness who presented to the ED on 12/10/2023 with abdominal pain. Lipase elevated. Admitted for recurrent pancreatitis and EtOH abuse with anticipated withdrawal.    Note frequent readmissions and several instances of leaving AMA. Since 09/24/2023 he has been in the ED or admitted every day EXCEPT 12/02/2023, 12/20-12/21, 12/12-14.    We treated him for alcohol  withdrawal with significant improvement. We had advanced his diet to full liquids with oxycodone  support for >24 hours. On 2/3 his withdrawal symptoms are improved to the point that he may no longer require benzodiazepines but is requesting ativan  3mg  IV which patient was educated on the risks of continuing this medication without medical necessity.  Patient continues to request IV morphine  as well.  Patient now tolerating p.o. quite well, weaned off IV medications and otherwise stable and agreeable for discharge.  Discharge Diagnoses:  Principal Problem:   Acute alcoholic pancreatitis Active Problems:   Alcohol  use disorder, severe, dependence (HCC)   Alcohol  dependence with withdrawal (HCC)   HIV (human immunodeficiency virus infection) [facility])    Discharge Instructions  Discharge Instructions     Call MD for:  difficulty breathing, headache or visual disturbances   Complete by: As directed    Call MD for:  extreme fatigue   Complete by: As directed     Call MD for:  persistant dizziness or light-headedness   Complete by: As directed    Call MD for:  persistant nausea and vomiting   Complete by: As directed    Call MD for:  severe uncontrolled pain   Complete by: As directed    Call MD for:  temperature >100.4   Complete by: As directed    Diet - low sodium heart healthy   Complete by: As directed    Increase activity slowly   Complete by: As directed       Allergies as of 12/15/2023       Reactions   Tegretol  [carbamazepine ] Other (See Comments)   Vertigo and paralysis   Caffeine Palpitations        Medication List     STOP taking these medications    Biktarvy  50-200-25 MG Tabs tablet Generic drug: bictegravir-emtricitabine -tenofovir  AF   ondansetron  4 MG disintegrating tablet Commonly known as: ZOFRAN -ODT       TAKE these medications    bisacodyl  5 MG EC tablet Generic drug: bisacodyl  Take 1 tablet (5 mg total) by mouth daily as needed for moderate constipation.   levETIRAcetam  500 MG tablet Commonly known as: Keppra  Take 1 tablet (500 mg total) by mouth 2 (two) times daily.   oxyCODONE  5 MG immediate release tablet Commonly known as: Oxy IR/ROXICODONE  Take 1 tablet (5 mg total) by mouth every 6 (six) hours as needed for up to 3 days for moderate pain (pain score 4-6) or severe pain (pain score 7-10).   sucralfate  1 g tablet Commonly known as: Carafate  Take 1 tablet (1 g total) by mouth 4 (four) times daily -  with meals  and at bedtime.        Follow-up Information     [facility]. Schedule an appointment as soon as possible for a visit today.   Contact information: [address] [place]  [address] [phone]               Allergies  Allergen Reactions   Tegretol  [Carbamazepine ] Other (See Comments)    Vertigo and paralysis   Caffeine Palpitations    Consultations: None  Procedures/Studies: CT ABDOMEN PELVIS W  CONTRAST Result Date: 12/05/2023 CLINICAL DATA:  Acute severe pancreatitis with abdominal pain. EXAM: CT ABDOMEN AND PELVIS WITH CONTRAST TECHNIQUE: Multidetector CT imaging of the abdomen and pelvis was performed using the standard protocol following bolus administration of intravenous contrast. RADIATION DOSE REDUCTION: This exam was performed according to the departmental dose-optimization program which includes automated exposure control, adjustment of the mA and/or kV according to patient size and/or use of iterative reconstruction technique. CONTRAST:  75mL OMNIPAQUE  IOHEXOL  350 MG/ML SOLN COMPARISON:  Multiple prior CTs since 2017. The 2 most recent are both with contrast and dated 11/25/2023 and 11/18/2023. 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 Stable appearance of overlying atelectasis in the left lower lobe. Lung bases are otherwise clear. The cardiac size is normal. There is scattered two-vessel coronary calcific plaque in the LAD and right coronary arteries but this is unusual in a 38 year old and age-advanced. Consider risk factor modification unless already being done. Hepatobiliary: No focal liver abnormality is seen. No gallstones, gallbladder wall thickening, or biliary dilatation. The liver is mildly steatotic. Pancreas: Small pancreas with truncated tail section. Inflammatory stranding continues to be seen about the head and uncinate process with small amount of peripancreatic fluid in the area but less fluid than previously. Again noted is a 1.2 cm pseudocyst, previously 1.4 cm in the pancreatic head and neck junction. No other localizing collection is seen. The overall appearance is similar to 11/18/2023, as above with decreased peripancreatic fluid since 11/24/2018. There is no mass enhancement.  No focal necrosis is seen. Spleen: Mildly prominent, measures 14 cm length. No mass enhancement. Adrenals/Urinary Tract:  There is no adrenal mass. 8 mm Bosniak 2 cyst in the inferior pole of the right kidney is too small to characterize but unchanged. Both kidneys are otherwise homogeneous. There is no urinary stone or obstruction. The bladder is unremarkable. Stomach/Bowel: There are chronic thickened folds in the duodenum likely reactive. No bowel obstruction or inflammation. No evidence of appendicitis. Moderate fecal stasis. Vascular/Lymphatic: No significant vascular findings are present. No enlarged abdominal or pelvic lymph nodes. Reproductive: Enlarged prostate 5.1 cm transverse, also unusual at this age. Other: No pelvic free fluid. No free hemorrhage, free air or abscess. No incarcerated hernia. Musculoskeletal: Degenerative disc disease and endplate irregularities of the lower thoracic spine. Transitional anatomy lumbosacral junction. No acute or other significant osseous findings. IMPRESSION: 1. Persistent inflammatory stranding about the head and uncinate process of the pancreas with small amount of peripancreatic fluid but less fluid than previously. 2. 1.2 cm pseudocyst in the pancreatic head and neck junction, previously 1.4 cm. 3. No mass enhancement or focal necrosis. Truncated pancreatic tail. 4. Chronic thickened folds in the duodenum likely reactive. 5. Constipation. 6. Mild hepatic steatosis.  Mild splenomegaly. 7. Two-vessel coronary calcific plaque, unusual in a 38 year old and age-advanced. Consider risk factor modification unless already being done. 8. Enlarged prostate. 9. Chronic moderate elevation of  the left hemidiaphragm consistent with eventration or paresis. Electronically Signed   By: Francis Quam M.D.   On: 12/05/2023 05:42   CT ABDOMEN PELVIS W CONTRAST Result Date: 11/25/2023 CLINICAL DATA:  Upper abdominal pain EXAM: CT ABDOMEN AND PELVIS WITH CONTRAST TECHNIQUE: Multidetector CT imaging of the abdomen and pelvis was performed using the standard protocol following bolus administration of  intravenous contrast. RADIATION DOSE REDUCTION: This exam was performed according to the departmental dose-optimization program which includes automated exposure control, adjustment of the mA and/or kV according to patient size and/or use of iterative reconstruction technique. CONTRAST:  OMNIPAQUE  IOHEXOL  300 MG/ML  SOLN COMPARISON:  11/18/2023 FINDINGS: Lower chest: Stable mild elevation of the left hemidiaphragm. Hepatobiliary: Diffuse low-density throughout the liver compatible with fatty infiltration. No focal abnormality. Gallbladder unremarkable. Pancreas: Continued fluid/stranding around the pancreatic head and uncinate process. Truncation of the pancreatic tail. Mild pancreatic ductal dilatation. Focal fluid collection in the pancreatic head measuring 1.3 cm, unchanged. Areas of decreased perfusion in the pancreatic head are stable and may reflect necrosis. Spleen: No focal abnormality.  Normal size. Adrenals/Urinary Tract: No suspicious renal or adrenal lesion. No stones or hydronephrosis. Urinary bladder decompressed, unremarkable. Stomach/Bowel: Normal appendix. Stomach, large and small bowel grossly unremarkable. Vascular/Lymphatic: No evidence of aneurysm or adenopathy. Reproductive: No visible focal abnormality. Other: No free fluid or free air. Musculoskeletal: No acute bony abnormality. IMPRESSION: Similar changes of acute pancreatitis. 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 Stable areas of decreased perfusion in the pancreatic head which may reflect areas of necrosis. Hepatic steatosis. Electronically Signed   By: Franky Crease M.D.   On: 11/25/2023 11:10   DG Chest 2 View Result Date: 11/25/2023 CLINICAL DATA:  Chest pain, upper abdominal pain EXAM: CHEST - 2 VIEW COMPARISON:  11/06/2023 FINDINGS: Heart and mediastinal contours are within normal limits. No focal opacities or effusions. No acute bony abnormality. IMPRESSION: No active cardiopulmonary disease. Electronically Signed    By: Franky Crease M.D.   On: 11/25/2023 11:06   CT ABDOMEN PELVIS W CONTRAST Result Date: 11/18/2023 CLINICAL DATA:  Severe acute pancreatitis. Abdominal pain. History of alcoholic pancreatitis. EXAM: CT ABDOMEN AND PELVIS WITH CONTRAST TECHNIQUE: Multidetector CT imaging of the abdomen and pelvis was performed using the standard protocol following bolus administration of intravenous contrast. RADIATION DOSE REDUCTION: This exam was performed according to the departmental dose-optimization program which includes automated exposure control, adjustment of the mA and/or kV according to patient size and/or use of iterative reconstruction technique. CONTRAST:  OMNIPAQUE  IOHEXOL  300 MG/ML  SOLN COMPARISON:  10/28/2023 FINDINGS: Lower chest: Left lower lobe atelectasis or pneumonia. Hepatobiliary: Hepatic steatosis. Unremarkable gallbladder and biliary tree. Pancreas: Pancreatic atrophy. Redemonstrated inflammatory stranding about the pancreatic head. The previous 2.1 cm pseudocyst in the uncinate process is not well visualized today. Unchanged 1.4 cm pseudocyst in the pancreatic head. Similar areas of poor enhancement in the pancreatic head and uncinate process suspicious for necrosis. No pancreatic ductal dilation. The overall appearance is similar to 10/28/2023. Spleen: Unremarkable. Adrenals/Urinary Tract: Normal adrenal glands. No urinary calculi or hydronephrosis. Unremarkable bladder. Stomach/Bowel: Normal caliber large and small bowel. Colonic wall thickening and mucosal hyperenhancement near the hepatic flexure likely reactive secondary to pancreatitis. Wall thickening of the duodenum and stomach with mucosal hyperenhancement is also favored reactive. Normal appendix. 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 Ill-defined filling defects in the superior mesenteric vein are favored due to mixing artifact. Thrombus is considered less  likely though not excluded  without delayed phase. No lymphadenopathy. Normal caliber aorta. Reproductive: Unremarkable. Other: Peripancreatic free fluid and free fluid in the pelvis. No organized fluid collection or abscess. No free intraperitoneal air. Musculoskeletal: No acute fracture. IMPRESSION: 1. Similar findings of acute pancreatitis with areas of poor enhancement in the pancreatic head and uncinate process suspicious for necrosis. 2. Reactive duodenitis, gastritis, and colitis near the hepatic flexure. 3. 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 4. Left lower lobe atelectasis or pneumonia. 5. Hepatic steatosis. Electronically Signed   By: Norman Gatlin M.D.   On: 11/18/2023 01:25     Subjective: No acute issues or events overnight denies nausea vomiting diarrhea constipation any fevers chills or chest pain   Discharge Exam: Vitals:   12/14/23 2028 12/15/23 0452  BP: 101/71 97/63  Pulse: 65 (!) 58  Resp: 20 16  Temp: 98.3 F (36.8 C) 98 F (36.7 C)  SpO2: 98% 98%   Vitals:   12/14/23 0523 12/14/23 1329 12/14/23 2028 12/15/23 0452  BP: (!) 98/58 105/74 101/71 97/63  Pulse: (!) 59 77 65 (!) 58  Resp: 15 18 20 16   Temp: 98.2 F (36.8 C) 98.4 F (36.9 C) 98.3 F (36.8 C) 98 F (36.7 C)  TempSrc: Oral Oral Oral Oral  SpO2: 98% 97% 98% 98%  Weight:      Height:        General: Pt is alert, awake, not in acute distress Cardiovascular: RRR, S1/S2 +, no rubs, no gallops Respiratory: CTA bilaterally, no wheezing, no rhonchi Abdominal: Soft, NT, ND, bowel sounds + Extremities: no edema, no cyanosis    The results of significant diagnostics from this hospitalization (including imaging, microbiology, ancillary and laboratory) are listed below for reference.     Microbiology: Recent Results (from the past 240 hours)  MRSA Next Gen by PCR, Nasal     Status: Abnormal   Collection Time: 12/06/23  9:06 PM   Specimen: Nasal Mucosa; Nasal Swab  Result Value Ref Range Status    MRSA by PCR Next Gen DETECTED (A) NOT DETECTED Final    Comment: RESULTS CALLED TO,READ BACK BY AND VERIFIED WITH RN Z.BULLARD ON 12/06/23 AT 2350 BY NM (NOTE) The GeneXpert MRSA Assay (FDA approved for NASAL specimens only), is one component of a comprehensive MRSA colonization surveillance program. It is not intended to diagnose MRSA infection nor to guide or monitor treatment for MRSA infections. Test performance is not FDA approved in patients less than 92 years old. Performed at [facility], [address] [place], [place] [address]      Labs: BNP (last 3 results) Recent Labs    12/08/23 0452  BNP 78.3   Basic Metabolic Panel: Recent Labs  Lab 12/09/23 2000 12/10/23 1628 12/11/23 0500 12/14/23 0425  NA 142 139 137 136  K 3.7 3.5 3.7 4.3  CL 106 103 106 101  CO2 25 24 27 28   GLUCOSE 113* 110* 107* 106*  BUN 9 10 11 8   CREATININE 0.64 0.31* 0.49* 0.45*  CALCIUM  9.1 8.7* 8.2* 9.6   Liver Function Tests: Recent Labs  Lab 12/09/23 2000 12/10/23 1628 12/11/23 0500 12/14/23 0425  AST 19 19 16  12*  ALT 16 15 12 13   ALKPHOS 55 52 46 40  BILITOT 0.6 0.6 0.7 0.5  PROT 6.9 7.3 5.8* 6.2*  ALBUMIN 3.7 3.9 3.0* 3.3*   Recent Labs  Lab 12/09/23 2000 12/10/23 1628  LIPASE 347* 1,080*  No results for input(s): AMMONIA in the last 168 hours. CBC: Recent Labs  Lab 12/09/23 2000 12/10/23 1628 12/11/23 0500 12/14/23 0425  WBC 4.2 4.0 3.9* 4.3  NEUTROABS 2.3  --   --   --   HGB 10.2* 10.4* 9.3* 10.3*  HCT 30.8* 32.0* 28.2* 31.4*  MCV 90.9 93.3 95.3 93.2  PLT 218 213 191 199   Urinalysis    Component Value Date/Time   COLORURINE YELLOW 12/10/2023 1617   APPEARANCEUR CLEAR 12/10/2023 1617   APPEARANCEUR Clear 11/15/2014 1755   LABSPEC 1.031 (H) 12/10/2023 1617   LABSPEC 1.002 11/15/2014 1755   PHURINE 5.0 12/10/2023 1617   GLUCOSEU NEGATIVE 12/10/2023 1617   GLUCOSEU Negative 11/15/2014 1755   HGBUR NEGATIVE 12/10/2023 1617   BILIRUBINUR  NEGATIVE 12/10/2023 1617   BILIRUBINUR Negative 11/15/2014 1755   KETONESUR NEGATIVE 12/10/2023 1617   PROTEINUR 30 (A) 12/10/2023 1617   NITRITE NEGATIVE 12/10/2023 1617   LEUKOCYTESUR NEGATIVE 12/10/2023 1617   LEUKOCYTESUR Negative 11/15/2014 1755   Sepsis Labs Recent Labs  Lab 12/09/23 2000 12/10/23 1628 12/11/23 0500 12/14/23 0425  WBC 4.2 4.0 3.9* 4.3   Microbiology Recent Results (from the past 240 hours)  MRSA Next Gen by PCR, Nasal     Status: Abnormal   Collection Time: 12/06/23  9:06 PM   Specimen: Nasal Mucosa; Nasal Swab  Result Value Ref Range Status   MRSA by PCR Next Gen DETECTED (A) NOT DETECTED Final    Comment: RESULTS CALLED TO,READ BACK BY AND VERIFIED WITH RN Z.BULLARD ON 12/06/23 AT 2350 BY NM (NOTE) The GeneXpert MRSA Assay (FDA approved for NASAL specimens only), is one component of a comprehensive MRSA colonization surveillance program. It is not intended to diagnose MRSA infection nor to guide or monitor treatment for MRSA infections. Test performance is not FDA approved in patients less than 84 years old. Performed at [facility], [address], [place], [place] [address]      Time coordinating discharge: Over 30 minutes  SIGNED:   Elsie JAYSON [place], DO [facility] 12/15/2023, 10:15 AM Pager   If 7PM-7AM, please contact night-coverage [email or web address]

## 2023-12-15 NOTE — Progress Notes (Signed)
 AVS reviewed w/ pt who verbalized an understanding. No other questions at this time. PIV removed as noted by primary RN. Pt requested additional clothing for d/c to home from primary RN- Chaplain consult placed at 1115. Clothing obtained by this RN  from clothing pantry in ED- no pants available at this time- pt provided w/ t-shirt, socks  &sweatshirt. Pt dressing for d/c to homePt also has 2 bus passes from TOC. TOC discharge meds in a secure bag given to pt in lobby by this RN.

## 2023-12-15 NOTE — TOC Transition Note (Signed)
 Transition of Care [facility]) - Discharge Note   Patient Details  Name: Gary [name] MRN: [id number] Date of Birth: April 25, 1986  Transition of Care [facility] CM/SW Contact:  Tawni CHRISTELLA Eva, LCSW Phone Number: 12/15/2023, 11:08 AM   Clinical Narrative:    CSW received a consult for a PCP referral and transportation assistance. CSW met with pt and provided the pt with a PCP list and 2 bus passes. Pt is requesting clothing, CSW requested the MD to place a consult for the chaplain. TOC sign-off   Final next level of care: Homeless Shelter Barriers to Discharge: Barriers Resolved   Patient Goals and CMS Choice Patient states their goals for this hospitalization and ongoing recovery are:: retrun to homeless shelter          Discharge Placement                    Patient and family notified of of transfer: 12/15/23  Discharge Plan and Services Additional resources added to the After Visit Summary for                                       Social Drivers of Health (SDOH) Interventions SDOH Screenings   Food Insecurity: Food Insecurity Present (12/11/2023)  Housing: High Risk (12/11/2023)  Transportation Needs: Unmet Transportation Needs (12/11/2023)  Utilities: Patient Declined (12/11/2023)  Recent Concern: Utilities - At Risk (11/11/2023)  Alcohol  Screen: High Risk (07/25/2021)  Depression (PHQ2-9): Medium Risk (02/12/2021)  Financial Resource Strain: Patient Declined (10/11/2023)  Social Connections: Unknown (06/24/2022)   Received from [facility], [facility]  Stress: No Stress Concern Present (06/30/2022)   Received from [facility], [facility]  Recent Concern: Stress - Stress Concern Present (06/25/2022)   Received from [facility]  Tobacco Use: High Risk (12/11/2023)     Readmission Risk Interventions    11/29/2023   10:27 AM 11/22/2023   10:36 AM 11/12/2023   12:41 PM  Readmission Risk Prevention Plan  Transportation Screening Complete Complete  Complete  Medication Review Oceanographer) Complete Complete Complete  PCP or Specialist appointment within 3-5 days of discharge  Complete   HRI or Home Care Consult Complete Complete Complete  SW Recovery Care/Counseling Consult Complete Complete Complete  Palliative Care Screening Not Applicable Not Applicable Not Applicable  Skilled Nursing Facility Not Applicable Not Applicable Not Applicable

## 2023-12-17 ENCOUNTER — Encounter (HOSPITAL_COMMUNITY): Payer: Self-pay | Admitting: Internal Medicine

## 2023-12-17 ENCOUNTER — Inpatient Hospital Stay (HOSPITAL_COMMUNITY)
Admission: EM | Admit: 2023-12-17 | Discharge: 2023-12-27 | DRG: 439 | Disposition: A | Discharge status: Home / Self Care | Payer: MEDICAID | Attending: Internal Medicine | Admitting: Internal Medicine

## 2023-12-17 ENCOUNTER — Emergency Department (HOSPITAL_COMMUNITY): Payer: MEDICAID

## 2023-12-17 ENCOUNTER — Other Ambulatory Visit: Payer: Self-pay

## 2023-12-17 DIAGNOSIS — Z91148 Patient's other noncompliance with medication regimen for other reason: Secondary | ICD-10-CM

## 2023-12-17 DIAGNOSIS — F1020 Alcohol dependence, uncomplicated: Secondary | ICD-10-CM | POA: Diagnosis not present

## 2023-12-17 DIAGNOSIS — K700 Alcoholic fatty liver: Secondary | ICD-10-CM | POA: Diagnosis present

## 2023-12-17 DIAGNOSIS — Z720 Tobacco use: Secondary | ICD-10-CM | POA: Diagnosis not present

## 2023-12-17 DIAGNOSIS — F3181 Bipolar II disorder: Secondary | ICD-10-CM | POA: Diagnosis present

## 2023-12-17 DIAGNOSIS — Z91199 Patient's noncompliance with other medical treatment and regimen due to unspecified reason: Secondary | ICD-10-CM | POA: Diagnosis not present

## 2023-12-17 DIAGNOSIS — R636 Underweight: Secondary | ICD-10-CM | POA: Diagnosis present

## 2023-12-17 DIAGNOSIS — F17210 Nicotine dependence, cigarettes, uncomplicated: Secondary | ICD-10-CM | POA: Diagnosis present

## 2023-12-17 DIAGNOSIS — Z716 Tobacco abuse counseling: Secondary | ICD-10-CM | POA: Diagnosis not present

## 2023-12-17 DIAGNOSIS — F10239 Alcohol dependence with withdrawal, unspecified: Secondary | ICD-10-CM | POA: Diagnosis present

## 2023-12-17 DIAGNOSIS — Z5900 Homelessness unspecified: Secondary | ICD-10-CM | POA: Diagnosis not present

## 2023-12-17 DIAGNOSIS — B20 Human immunodeficiency virus [HIV] disease: Secondary | ICD-10-CM | POA: Diagnosis present

## 2023-12-17 DIAGNOSIS — Z888 Allergy status to other drugs, medicaments and biological substances status: Secondary | ICD-10-CM

## 2023-12-17 DIAGNOSIS — Z811 Family history of alcohol abuse and dependence: Secondary | ICD-10-CM | POA: Diagnosis not present

## 2023-12-17 DIAGNOSIS — F1910 Other psychoactive substance abuse, uncomplicated: Secondary | ICD-10-CM | POA: Diagnosis present

## 2023-12-17 DIAGNOSIS — K852 Alcohol induced acute pancreatitis without necrosis or infection: Secondary | ICD-10-CM | POA: Diagnosis not present

## 2023-12-17 DIAGNOSIS — Z681 Body mass index (BMI) 19 or less, adult: Secondary | ICD-10-CM | POA: Diagnosis not present

## 2023-12-17 DIAGNOSIS — Z5901 Sheltered homelessness: Secondary | ICD-10-CM

## 2023-12-17 DIAGNOSIS — G40909 Epilepsy, unspecified, not intractable, without status epilepticus: Secondary | ICD-10-CM | POA: Diagnosis present

## 2023-12-17 DIAGNOSIS — Z79899 Other long term (current) drug therapy: Secondary | ICD-10-CM

## 2023-12-17 DIAGNOSIS — F411 Generalized anxiety disorder: Secondary | ICD-10-CM | POA: Diagnosis present

## 2023-12-17 DIAGNOSIS — F209 Schizophrenia, unspecified: Secondary | ICD-10-CM | POA: Diagnosis present

## 2023-12-17 DIAGNOSIS — K8591 Acute pancreatitis with uninfected necrosis, unspecified: Secondary | ICD-10-CM | POA: Insufficient documentation

## 2023-12-17 DIAGNOSIS — K859 Acute pancreatitis without necrosis or infection, unspecified: Principal | ICD-10-CM | POA: Insufficient documentation

## 2023-12-17 DIAGNOSIS — F4310 Post-traumatic stress disorder, unspecified: Secondary | ICD-10-CM | POA: Diagnosis present

## 2023-12-17 DIAGNOSIS — F10939 Alcohol use, unspecified with withdrawal, unspecified: Secondary | ICD-10-CM | POA: Diagnosis not present

## 2023-12-17 DIAGNOSIS — K567 Ileus, unspecified: Secondary | ICD-10-CM | POA: Diagnosis not present

## 2023-12-17 DIAGNOSIS — Z21 Asymptomatic human immunodeficiency virus [HIV] infection status: Secondary | ICD-10-CM | POA: Diagnosis present

## 2023-12-17 DIAGNOSIS — R109 Unspecified abdominal pain: Secondary | ICD-10-CM | POA: Diagnosis present

## 2023-12-17 DIAGNOSIS — F17200 Nicotine dependence, unspecified, uncomplicated: Secondary | ICD-10-CM | POA: Diagnosis present

## 2023-12-17 LAB — COMPREHENSIVE METABOLIC PANEL
ALT: 189 U/L — ABNORMAL HIGH (ref 0–44)
AST: 465 U/L — ABNORMAL HIGH (ref 15–41)
Albumin: 3.4 g/dL — ABNORMAL LOW (ref 3.5–5.0)
Alkaline Phosphatase: 42 U/L (ref 38–126)
Anion gap: 10 (ref 5–15)
BUN: 9 mg/dL (ref 6–20)
CO2: 25 mmol/L (ref 22–32)
Calcium: 9.1 mg/dL (ref 8.9–10.3)
Chloride: 102 mmol/L (ref 98–111)
Creatinine, Ser: 0.61 mg/dL (ref 0.61–1.24)
GFR, Estimated: >60 mL/min (ref >60)
Glucose, Bld: 91 mg/dL (ref 70–99)
Potassium: 4.2 mmol/L (ref 3.5–5.1)
Sodium: 137 mmol/L (ref 135–145)
Total Bilirubin: 0.8 mg/dL (ref 0.0–1.2)
Total Protein: 7.1 g/dL (ref 6.5–8.1)

## 2023-12-17 LAB — CBC WITH DIFFERENTIAL/PLATELET
Abs Immature Granulocytes: 0.01 10*3/uL (ref 0.00–0.07)
Basophils Absolute: 0 10*3/uL (ref 0.0–0.1)
Basophils Relative: 1 %
Eosinophils Absolute: 0 10*3/uL (ref 0.0–0.5)
Eosinophils Relative: 0 %
HCT: 37 % — ABNORMAL LOW (ref 39.0–52.0)
Hemoglobin: 12.2 g/dL — ABNORMAL LOW (ref 13.0–17.0)
Immature Granulocytes: 0 %
Lymphocytes Relative: 26 %
Lymphs Abs: 1.4 10*3/uL (ref 0.7–4.0)
MCH: 30.3 pg (ref 26.0–34.0)
MCHC: 33 g/dL (ref 30.0–36.0)
MCV: 91.8 fL (ref 80.0–100.0)
Monocytes Absolute: 0.5 10*3/uL (ref 0.1–1.0)
Monocytes Relative: 10 %
Neutro Abs: 3.3 10*3/uL (ref 1.7–7.7)
Neutrophils Relative %: 63 %
Platelets: 310 10*3/uL (ref 150–400)
RBC: 4.03 MIL/uL — ABNORMAL LOW (ref 4.22–5.81)
RDW: 16.1 % — ABNORMAL HIGH (ref 11.5–15.5)
WBC: 5.2 10*3/uL (ref 4.0–10.5)
nRBC: 0 % (ref 0.0–0.2)

## 2023-12-17 LAB — PHOSPHORUS: Phosphorus: 3.5 mg/dL (ref 2.5–4.6)

## 2023-12-17 LAB — MAGNESIUM: Magnesium: 2.4 mg/dL (ref 1.7–2.4)

## 2023-12-17 LAB — LIPASE, BLOOD: Lipase: 383 U/L — ABNORMAL HIGH (ref 11–51)

## 2023-12-17 MED ORDER — LORAZEPAM 1 MG PO TABS
1.0000 mg | ORAL_TABLET | ORAL | Status: AC | PRN
  Administered 2023-12-18: 1 mg via ORAL
  Administered 2023-12-19 – 2023-12-20 (×3): 2 mg via ORAL
  Filled 2023-12-17 (×3): qty 2
  Filled 2023-12-17: qty 1

## 2023-12-17 MED ORDER — ONDANSETRON HCL 4 MG/2ML IJ SOLN
4.0000 mg | Freq: Four times a day (QID) | INTRAMUSCULAR | Status: DC | PRN
  Administered 2023-12-18 – 2023-12-23 (×3): 4 mg via INTRAVENOUS
  Filled 2023-12-17 (×3): qty 2

## 2023-12-17 MED ORDER — ACETAMINOPHEN 325 MG PO TABS
650.0000 mg | ORAL_TABLET | Freq: Four times a day (QID) | ORAL | Status: DC | PRN
  Administered 2023-12-18 – 2023-12-27 (×7): 650 mg via ORAL
  Filled 2023-12-17 (×7): qty 2

## 2023-12-17 MED ORDER — LEVETIRACETAM IN NACL 500 MG/100ML IV SOLN
500.0000 mg | Freq: Two times a day (BID) | INTRAVENOUS | Status: DC
  Administered 2023-12-17 – 2023-12-19 (×6): 500 mg via INTRAVENOUS
  Filled 2023-12-17 (×7): qty 100

## 2023-12-17 MED ORDER — METOCLOPRAMIDE HCL 5 MG/ML IJ SOLN
10.0000 mg | Freq: Once | INTRAMUSCULAR | Status: AC
  Administered 2023-12-17: 10 mg via INTRAVENOUS
  Filled 2023-12-17: qty 2

## 2023-12-17 MED ORDER — LORAZEPAM 2 MG/ML IJ SOLN
1.0000 mg | Freq: Once | INTRAMUSCULAR | Status: AC
  Administered 2023-12-17: 1 mg via INTRAVENOUS
  Filled 2023-12-17: qty 1

## 2023-12-17 MED ORDER — LORAZEPAM 1 MG PO TABS
0.0000 mg | ORAL_TABLET | Freq: Three times a day (TID) | ORAL | Status: AC
  Administered 2023-12-19 – 2023-12-20 (×3): 2 mg via ORAL
  Filled 2023-12-17: qty 2
  Filled 2023-12-17: qty 1
  Filled 2023-12-17 (×2): qty 2

## 2023-12-17 MED ORDER — PHENOBARBITAL 32.4 MG PO TABS
64.8000 mg | ORAL_TABLET | Freq: Two times a day (BID) | ORAL | Status: AC
  Administered 2023-12-17 – 2023-12-18 (×2): 64.8 mg via ORAL
  Filled 2023-12-17 (×2): qty 2

## 2023-12-17 MED ORDER — MORPHINE SULFATE (PF) 4 MG/ML IV SOLN
4.0000 mg | Freq: Once | INTRAVENOUS | Status: AC
  Administered 2023-12-17: 4 mg via INTRAVENOUS
  Filled 2023-12-17: qty 1

## 2023-12-17 MED ORDER — THIAMINE MONONITRATE 100 MG PO TABS
100.0000 mg | ORAL_TABLET | Freq: Every day | ORAL | Status: DC
  Administered 2023-12-18 – 2023-12-27 (×10): 100 mg via ORAL
  Filled 2023-12-17 (×10): qty 1

## 2023-12-17 MED ORDER — DEXTROSE-SODIUM CHLORIDE 5-0.9 % IV SOLN: INTRAVENOUS | Status: AC

## 2023-12-17 MED ORDER — ONDANSETRON HCL 4 MG/2ML IJ SOLN
4.0000 mg | Freq: Once | INTRAMUSCULAR | Status: AC
  Administered 2023-12-17: 4 mg via INTRAVENOUS
  Filled 2023-12-17: qty 2

## 2023-12-17 MED ORDER — PANTOPRAZOLE SODIUM 40 MG IV SOLR
40.0000 mg | Freq: Every day | INTRAVENOUS | Status: DC
  Administered 2023-12-17 – 2023-12-18 (×2): 40 mg via INTRAVENOUS
  Filled 2023-12-17 (×2): qty 10

## 2023-12-17 MED ORDER — ADULT MULTIVITAMIN W/MINERALS CH
1.0000 | ORAL_TABLET | Freq: Every day | ORAL | Status: DC
  Administered 2023-12-17 – 2023-12-27 (×11): 1 via ORAL
  Filled 2023-12-17 (×11): qty 1

## 2023-12-17 MED ORDER — PHENOBARBITAL 32.4 MG PO TABS
32.4000 mg | ORAL_TABLET | Freq: Two times a day (BID) | ORAL | Status: DC
  Filled 2023-12-17: qty 1

## 2023-12-17 MED ORDER — SODIUM CHLORIDE 0.9 % IV BOLUS
1000.0000 mL | Freq: Once | INTRAVENOUS | Status: AC
Start: 2023-12-17 — End: 2023-12-17
  Administered 2023-12-17: 1000 mL via INTRAVENOUS

## 2023-12-17 MED ORDER — ACETAMINOPHEN 650 MG RE SUPP: 650.0000 mg | Freq: Four times a day (QID) | RECTAL | Status: DC | PRN

## 2023-12-17 MED ORDER — FOLIC ACID 1 MG PO TABS
1.0000 mg | ORAL_TABLET | Freq: Every day | ORAL | Status: DC
  Administered 2023-12-17 – 2023-12-27 (×11): 1 mg via ORAL
  Filled 2023-12-17 (×11): qty 1

## 2023-12-17 MED ORDER — PHENOBARBITAL 32.4 MG PO TABS: 16.2000 mg | ORAL_TABLET | Freq: Two times a day (BID) | ORAL | Status: DC

## 2023-12-17 MED ORDER — ONDANSETRON HCL 4 MG PO TABS
4.0000 mg | ORAL_TABLET | Freq: Four times a day (QID) | ORAL | Status: DC | PRN
  Administered 2023-12-20 – 2023-12-22 (×4): 4 mg via ORAL
  Filled 2023-12-17 (×5): qty 1

## 2023-12-17 MED ORDER — LORAZEPAM 2 MG/ML IJ SOLN
1.0000 mg | INTRAMUSCULAR | Status: DC | PRN
Start: 2023-12-17 — End: 2023-12-20
  Administered 2023-12-18: 2 mg via INTRAVENOUS
  Administered 2023-12-18: 3 mg via INTRAVENOUS
  Administered 2023-12-18 – 2023-12-19 (×3): 2 mg via INTRAVENOUS
  Filled 2023-12-17 (×3): qty 1
  Filled 2023-12-17: qty 2
  Filled 2023-12-17: qty 1

## 2023-12-17 MED ORDER — LORAZEPAM 1 MG PO TABS
0.0000 mg | ORAL_TABLET | ORAL | Status: AC
  Administered 2023-12-18: 1 mg via ORAL
  Administered 2023-12-19: 2 mg via ORAL
  Filled 2023-12-17: qty 2
  Filled 2023-12-17: qty 1
  Filled 2023-12-17 (×2): qty 2

## 2023-12-17 MED ORDER — THIAMINE HCL 100 MG/ML IJ SOLN
100.0000 mg | Freq: Every day | INTRAMUSCULAR | Status: DC
  Administered 2023-12-17: 100 mg via INTRAVENOUS
  Filled 2023-12-17: qty 2

## 2023-12-17 MED ORDER — IOHEXOL 300 MG/ML  SOLN
100.0000 mL | Freq: Once | INTRAMUSCULAR | Status: AC | PRN
  Administered 2023-12-17: 100 mL via INTRAVENOUS

## 2023-12-17 MED ORDER — BOOST / RESOURCE BREEZE PO LIQD CUSTOM
1.0000 | Freq: Three times a day (TID) | ORAL | Status: DC
  Administered 2023-12-17 – 2023-12-27 (×25): 1 via ORAL

## 2023-12-17 NOTE — ED Provider Notes (Signed)
 [facility] EMERGENCY DEPARTMENT AT [facility] Provider Note   CSN: [id number] Arrival date & time: 12/17/23  9194     History  Chief Complaint  Patient presents with   Abdominal Pain    Gary [name] is a 38 y.o. male.   Abdominal Pain 38 year old male history of alcohol  use, recurrent pancreatitis presenting for abdominal pain.  He said belly pain for 2 days as well as nonbloody diarrhea.  No recent antibiotics per patient.  He tried to drink some alcohol  last night but could not.  No chest pain or shortness of breath.  He has had some vomiting.  No fevers or chills.     Home Medications Prior to Admission medications   Medication Sig Start Date End Date Taking? Authorizing Provider  bisacodyl  (DULCOLAX) 5 MG EC tablet Take 1 tablet (5 mg total) by mouth daily as needed for moderate constipation. 12/01/23   Amin, Ankit C, MD  levETIRAcetam  (KEPPRA ) 500 MG tablet Take 1 tablet (500 mg total) by mouth 2 (two) times daily. 11/18/23   Gaetano Pac, MD  oxyCODONE  (OXY IR/ROXICODONE ) 5 MG immediate release tablet Take 1 tablet (5 mg total) by mouth every 6 (six) hours as needed for up to 3 days for moderate pain (pain score 4-6) or severe pain (pain score 7-10). 12/15/23 12/18/23  Lue Elsie BROCKS, MD  sucralfate  (CARAFATE ) 1 g tablet Take 1 tablet (1 g total) by mouth 4 (four) times daily -  with meals and at bedtime. 12/10/23   Horton, Charmaine FALCON, MD  famotidine  (PEPCID ) 20 MG tablet Take 1 tablet (20 mg total) by mouth 2 (two) times daily. Patient not taking: Reported on 06/01/2019 05/14/19 06/02/19  [name], MD      Allergies    Tegretol  [carbamazepine ] and Caffeine    Review of Systems   Review of Systems  Gastrointestinal:  Positive for abdominal pain.  Review of systems completed and notable as per HPI.  ROS otherwise negative.   Physical Exam Updated Vital Signs BP 112/76   Pulse 88   Temp 98.9 F (37.2 C)   Resp 18   Ht 6' (1.829 m)   Wt 54 kg    SpO2 100%   BMI 16.15 kg/m  Physical Exam Vitals and nursing note reviewed.  Constitutional:      General: He is not in acute distress.    Appearance: He is well-developed.  HENT:     Head: Normocephalic and atraumatic.     Nose: Nose normal.     Mouth/Throat:     Mouth: Mucous membranes are dry.     Pharynx: Oropharynx is clear.  Eyes:     Conjunctiva/sclera: Conjunctivae normal.  Cardiovascular:     Rate and Rhythm: Normal rate and regular rhythm.     Pulses: Normal pulses.     Heart sounds: Normal heart sounds. No murmur heard. Pulmonary:     Effort: Pulmonary effort is normal. No respiratory distress.     Breath sounds: Normal breath sounds.  Abdominal:     Palpations: Abdomen is soft.     Tenderness: There is abdominal tenderness. There is no guarding or rebound.     Comments: Mild generalized tenderness  Musculoskeletal:        General: No swelling.     Cervical back: Neck supple.     Right lower leg: No edema.     Left lower leg: No edema.  Skin:    General: Skin is warm and dry.  Capillary Refill: Capillary refill takes less than 2 seconds.  Neurological:     General: No focal deficit present.     Mental Status: He is alert and oriented to person, place, and time. Mental status is at baseline.  Psychiatric:        Mood and Affect: Mood normal.     ED Results / Procedures / Treatments   Labs (all labs ordered are listed, but only abnormal results are displayed) Labs Reviewed  COMPREHENSIVE METABOLIC PANEL - Abnormal; Notable for the following components:      Result Value   Albumin 3.4 (*)    AST 465 (*)    ALT 189 (*)    All other components within normal limits  CBC WITH DIFFERENTIAL/PLATELET - Abnormal; Notable for the following components:   RBC 4.03 (*)    Hemoglobin 12.2 (*)    HCT 37.0 (*)    RDW 16.1 (*)    All other components within normal limits  LIPASE, BLOOD - Abnormal; Notable for the following components:   Lipase 383 (*)    All  other components within normal limits  GASTROINTESTINAL PANEL BY PCR, STOOL (REPLACES STOOL CULTURE)  MAGNESIUM   PHOSPHORUS    EKG None  Radiology CT ABDOMEN PELVIS W CONTRAST Result Date: 12/17/2023 CLINICAL DATA:  Abdominal pain, acute, nonlocalized. Abdominal pain. Vomiting. EXAM: CT ABDOMEN AND PELVIS WITH CONTRAST TECHNIQUE: Multidetector CT imaging of the abdomen and pelvis was performed using the standard protocol following bolus administration of intravenous contrast. RADIATION DOSE REDUCTION: This exam was performed according to the departmental dose-optimization program which includes automated exposure control, adjustment of the mA and/or kV according to patient size and/or use of iterative reconstruction technique. CONTRAST:  OMNIPAQUE  IOHEXOL  300 MG/ML  SOLN COMPARISON:  CT scan abdomen and pelvis from 12/05/2023. FINDINGS: Lower chest: Linear area of atelectasis/scarring noted in the left lower lobe. The lung bases are otherwise clear. No pleural effusion. The heart is normal in size. No pericardial effusion. Hepatobiliary: The liver is normal in size. Non-cirrhotic configuration. No suspicious mass. These is diffuse hepatic steatosis. No intrahepatic or extrahepatic bile duct dilation. No calcified gallstones. Normal gallbladder wall thickness. No pericholecystic inflammatory changes. Pancreas: There is mild-to-moderate fat stranding surrounding the pancreatic head/uncinate process, slightly less pronounced when compared to the prior exam. Redemonstration of at least 2, ill-defined hypoattenuating peripancreatic areas abutting the pancreas measuring 1.0 x 1.3 cm orthogonally on coronal plane in the pancreatic head region anteriorly and 1.0 x 1.2 cm orthogonally on coronal plane in the pancreatic head region, posteriorly. These areas were present on the prior examination but appears slightly enlarged. No discrete wall seen. There is also moderate narrowing of the superior mesenteric  vein just before the confluence to form portal vein (which has increased since the prior study). There is mild-to-moderate narrowing of the proximal portion of portal vein (series 8, image 66), which also has increased since the prior study. Small/atrophic pancreatic body and absent pancreatic tail noted. Spleen: Within normal limits. No focal lesion. Adrenals/Urinary Tract: Adrenal glands are unremarkable. No suspicious renal mass. There is a stable subcentimeter simple cyst in the right kidney lower pole no hydronephrosis. No renal or ureteric calculi. Urinary bladder is under distended, precluding optimal assessment. However, no large mass or stones identified. No perivesical fat stranding. Stomach/Bowel: No disproportionate dilation of the small or large bowel loops. No evidence of abnormal bowel wall thickening or inflammatory changes. The appendix is unremarkable. Vascular/Lymphatic: No ascites or pneumoperitoneum. No abdominal  or pelvic lymphadenopathy, by size criteria. No aneurysmal dilation of the major abdominal arteries. Reproductive: Enlarged prostate. Symmetric seminal vesicles. Other: There is a tiny fat containing umbilical hernia. The soft tissues and abdominal wall are otherwise unremarkable. Musculoskeletal: No suspicious osseous lesions. IMPRESSION: 1. Redemonstration of fat stranding surrounding the pancreatic head/uncinate process and small amount of non walled-off fluid in the peripancreatic region. Findings favor sequela of pancreatitis with peripancreatic collections/pseudocyst. Correlate with serum lipase levels. 2. There is increasing mass effect with moderate narrowing of the superior mesenteric vein and mild-to-moderate narrowing of the proximal portal vein, as discussed above. No thrombosis. 3. No other acute inflammatory process identified within the abdomen or pelvis. 4. Multiple other nonacute observations, as described above. Electronically Signed   By: Ree Molt M.D.   On:  12/17/2023 14:20    Procedures Procedures    Medications Ordered in ED Medications  levETIRAcetam  (KEPPRA ) IVPB 500 mg/100 mL premix (500 mg Intravenous New Bag/Given 12/17/23 1540)  PHENobarbital  (LUMINAL) tablet 64.8 mg (has no administration in time range)  PHENobarbital  (LUMINAL) tablet 32.4 mg (has no administration in time range)  phenobarbital  (LUMINAL) tablet 16.2 mg (has no administration in time range)  LORazepam  (ATIVAN ) tablet 1-4 mg (has no administration in time range)    Or  LORazepam  (ATIVAN ) injection 1-4 mg (has no administration in time range)  thiamine  (VITAMIN B1) tablet 100 mg ( Oral See Alternative 12/17/23 1540)    Or  thiamine  (VITAMIN B1) injection 100 mg (100 mg Intravenous Given 12/17/23 1540)  folic acid  (FOLVITE ) tablet 1 mg (1 mg Oral Given 12/17/23 1540)  multivitamin with minerals tablet 1 tablet (1 tablet Oral Given 12/17/23 1540)  LORazepam  (ATIVAN ) tablet 0-4 mg (has no administration in time range)    Followed by  LORazepam  (ATIVAN ) tablet 0-4 mg (has no administration in time range)  acetaminophen  (TYLENOL ) tablet 650 mg (has no administration in time range)    Or  acetaminophen  (TYLENOL ) suppository 650 mg (has no administration in time range)  ondansetron  (ZOFRAN ) tablet 4 mg (has no administration in time range)    Or  ondansetron  (ZOFRAN ) injection 4 mg (has no administration in time range)  pantoprazole  (PROTONIX ) injection 40 mg (40 mg Intravenous Given 12/17/23 1540)  dextrose  5 %-0.9 % sodium chloride  infusion (has no administration in time range)  morphine  (PF) 4 MG/ML injection 4 mg (4 mg Intravenous Given 12/17/23 0931)  sodium chloride  0.9 % bolus 1,000 mL (1,000 mLs Intravenous New Bag/Given 12/17/23 0931)  ondansetron  (ZOFRAN ) injection 4 mg (4 mg Intravenous Given 12/17/23 0931)  morphine  (PF) 4 MG/ML injection 4 mg (4 mg Intravenous Given 12/17/23 1105)  metoCLOPramide  (REGLAN ) injection 10 mg (10 mg Intravenous Given 12/17/23 1104)  iohexol   (OMNIPAQUE ) 300 MG/ML solution 100 mL (100 mLs Intravenous Contrast Given 12/17/23 1209)  LORazepam  (ATIVAN ) injection 1 mg (1 mg Intravenous Given 12/17/23 1245)  LORazepam  (ATIVAN ) injection 1 mg (1 mg Intravenous Given 12/17/23 1447)    ED Course/ Medical Decision Making/ A&P                                 Medical Decision Making Amount and/or Complexity of Data Reviewed Labs: ordered. Radiology: ordered.  Risk Prescription drug management. Decision regarding hospitalization.   Medical Decision Making:   ZAKI GERTSCH is a 38 y.o. male who presented to the ED today with abdominal pain, vomiting.  Vital signs are overall unremarkable.  On exam he is mildly tender but not peritonitic.  He reports significant pain.  Lab was notable for AST predominant transaminitis likely related to alcohol  use, lipase is elevated as well.  CT scan shows findings roughly similar to prior consistent with acute on chronic pancreatitis.  Despite multiple doses of pain and nausea medication he is still intolerant p.o.  He is having significant diarrhea, no signs of C. difficile.  GI pathogen panel was added on.  He has had some elevated CO but is not tremulous, nontachycardic, not hypertensive.  I did give him some Ativan  with improvement.  I spoke with the hospitalist Dr. Celinda and he was admitted for further management.   Patient placed on continuous vitals and telemetry monitoring while in ED which was reviewed periodically.  Reviewed and confirmed nursing documentation for past medical history, family history, social history.   Patient's presentation is most consistent with acute complicated illness / injury requiring diagnostic workup.           Final Clinical Impression(s) / ED Diagnoses Final diagnoses:  Acute pancreatitis, unspecified complication status, unspecified pancreatitis type    Rx / DC Orders ED Discharge Orders     None         Nicholaus Cassondra DEL, MD 12/17/23 1541

## 2023-12-17 NOTE — ED Notes (Signed)
 Pt belongings consisting of 1 sweatshirt, 1 jacket, 1 hat and oxycodone  bottle place in bag, in 19-22 cabinet

## 2023-12-17 NOTE — ED Triage Notes (Signed)
 Pt presents to ED via EMS, states "I'm having severe pancreatitis flare up and alcohol withdrawal" c/o abd pain and vomiting reports "tried to drink alcohol yesterday but kept throwing it up"

## 2023-12-17 NOTE — H&P (Signed)
 History and Physical    Patient: Gary [name] FMW:[id number] DOB: [birth year] DOA: 12/17/2023 DOS: the patient was seen and examined on 12/17/2023 PCP: Patient, No Pcp Per  Patient coming from: Home  Chief Complaint:  Chief Complaint  Patient presents with   Abdominal Pain   HPI: Gary [name] is a 38 y.o. male with medical history significant of alcohol  abuse, tobacco abuse, polysubstance abuse, HIV disease not on treatment, history of subdural hematoma, history of seizures, treatment noncompliance, bipolar 2 disorder, PTSD, schizophrenia, alcohol  induced pancreatitis who was recently admitted and discharged from 12/10/2023 until 12/15/2023 due to chronic alcoholic pancreatitis exacerbation and alcohol  abuse.  He was treated for alcohol  withdrawal.  The patient stated that after the discharge he was having pain after eating or drinking water .  He has several episodes of emesis.  He started using alcohol  to calm down the pain.  He has had 3 big bottles of vodka and 2 bottles of wine since he was discharged. He denied fever, chills, rhinorrhea, sore throat, wheezing or hemoptysis.  No chest pain, palpitations, diaphoresis, PND, orthopnea or pitting edema of the lower extremities.  No abdominal pain, nausea, emesis, diarrhea, constipation, melena or hematochezia.  No flank pain, dysuria, frequency or hematuria.  No polyuria, polydipsia, polyphagia or blurred vision.   Lab work: CBC showed a white of 5.2, hemoglobin 12.2 g/dL and platelets 689.  Lipase was 383 units/L.  CMP showed an albumin of 3.4 g/dL, AST 534 and ALT 810 units/L, the rest of the LFTs, renal function and electrolytes were normal.  Imaging: CT abdomen/pelvis with contrast redemonstrated fat stranding surrounding the pancreatic head/uncinate process and small amount of known walled off fluid in the peripancreatic region.  Findings favor sequela pancreatitis with peripancreatic collections/pseudocyst.  Correlate with serum lipase.  There  is increasing mass effect with moderate narrowing of the superior mesenteric vein and mild to moderate narrowing of the proximal portal vein, but no thrombosis.  No other acute inflammatory process within the abdomen or pelvis.  ED course: Initial vital signs were temperature 98.8 F, pulse 96, respiration 18, BP 102/79 mmHg O2 sat 100% on room air.  The patient received lorazepam  1 mg IVP x 2, metoclopramide  10 mg IVP x 1, morphine  4 mg IVP x 2, ondansetron  4 mg IVP x 1 and 1000 mL of normal saline bolus.   Review of Systems: As mentioned in the history of present illness. All other systems reviewed and are negative.  Past Medical History:  Diagnosis Date   Alcohol  abuse    Alcohol -induced pancreatitis 04/16/2022   Anxiety    Bipolar 2 disorder (HCC)    HIV (human immunodeficiency virus infection) (HCC)    Pancreatitis    PTSD (post-traumatic stress disorder)    Schizophrenia (HCC)    Seizures (HCC)    Subdural hematoma (HCC)    Past Surgical History:  Procedure Laterality Date   BIOPSY  04/19/2022   Procedure: BIOPSY;  Surgeon: Wilhelmenia Aloha Raddle., MD;  Location: [facility] ENDOSCOPY;  Service: Gastroenterology;;   ENTEROSCOPY N/A 04/19/2022   Procedure: ENTEROSCOPY;  Surgeon: Wilhelmenia Aloha Raddle., MD;  Location: [facility] ENDOSCOPY;  Service: Gastroenterology;  Laterality: N/A;   INCISION AND DRAINAGE PERIRECTAL ABSCESS N/A 09/24/2016   Procedure: IRRIGATION AND DEBRIDEMENT PERIRECTAL ABSCESS;  Surgeon: Carlin Pastel, MD;  Location: [facility] ORS;  Service: General;  Laterality: N/A;   none     Social History:  reports that he has been smoking cigarettes. He started smoking about 21 years ago.  He has a 10.6 pack-year smoking history. He has never used smokeless tobacco. He reports current alcohol  use of about 105.0 standard drinks of alcohol  per week. He reports current drug use. Drugs: Methamphetamines and Cocaine.  Allergies  Allergen Reactions   Tegretol  [Carbamazepine ] Other (See Comments)     Vertigo and paralysis   Caffeine Palpitations    Family History  Problem Relation Age of Onset   Alcohol  abuse Mother    Alcohol  abuse Father    Colon cancer Other    Other Other    Cancer Other     Prior to Admission medications   Medication Sig Start Date End Date Taking? Authorizing Provider  bisacodyl  (DULCOLAX) 5 MG EC tablet Take 1 tablet (5 mg total) by mouth daily as needed for moderate constipation. 12/01/23   Amin, Ankit C, MD  levETIRAcetam  (KEPPRA ) 500 MG tablet Take 1 tablet (500 mg total) by mouth 2 (two) times daily. 11/18/23   Gaetano Pac, MD  oxyCODONE  (OXY IR/ROXICODONE ) 5 MG immediate release tablet Take 1 tablet (5 mg total) by mouth every 6 (six) hours as needed for up to 3 days for moderate pain (pain score 4-6) or severe pain (pain score 7-10). 12/15/23 12/18/23  Lue Elsie BROCKS, MD  sucralfate  (CARAFATE ) 1 g tablet Take 1 tablet (1 g total) by mouth 4 (four) times daily -  with meals and at bedtime. 12/10/23   Horton, Charmaine FALCON, MD  famotidine  (PEPCID ) 20 MG tablet Take 1 tablet (20 mg total) by mouth 2 (two) times daily. Patient not taking: Reported on 06/01/2019 05/14/19 06/02/19  [name], MD    Physical Exam: Vitals:   12/17/23 0935 12/17/23 1145 12/17/23 1300 12/17/23 1348  BP: 103/83 113/78 108/73 112/76  Pulse: 81 86 79 88  Resp:   18   Temp:   98.9 F (37.2 C)   TempSrc:      SpO2:  100% 100%   Weight:      Height:       Physical Exam Vitals and nursing note reviewed.  Constitutional:      Appearance: He is well-developed. He is ill-appearing.  HENT:     Head: Normocephalic.     Nose: No rhinorrhea.     Mouth/Throat:     Mouth: Mucous membranes are dry.  Eyes:     General: No scleral icterus.    Pupils: Pupils are equal, round, and reactive to light.  Cardiovascular:     Rate and Rhythm: Normal rate and regular rhythm.  Pulmonary:     Effort: Pulmonary effort is normal.     Breath sounds: Normal breath sounds.  Abdominal:      General: Bowel sounds are normal.     Palpations: Abdomen is soft.     Tenderness: There is abdominal tenderness in the epigastric area.  Musculoskeletal:     Cervical back: Neck supple.     Right lower leg: No edema.     Left lower leg: No edema.  Skin:    General: Skin is warm and dry.  Neurological:     General: No focal deficit present.     Mental Status: He is alert and oriented to person, place, and time.  Psychiatric:        Mood and Affect: Mood normal.        Behavior: Behavior normal.     Data Reviewed:  Results are pending, will review when available.  Assessment and Plan:  Acute alcoholic pancreatitis PCU/inpatient. Continue IV  fluids. Keep n.p.o. for now. Analgesics as needed. Antiemetics as needed. Pantoprazole  40 mg IVP daily. Follow CBC, CMP and lipase in AM.   Active Problems:   Alcohol  use disorder, severe, dependence (HCC) CIWA protocol with lorazepam . Magnesium  sulfate supplementation. Folate, MVI and thiamine . Consult TOC team. Alcohol  cessation advised. Phenobarbital  taper.     Seizure disorder (HCC) Keppra  was resumed. Phenobarbital  added to withdrawal protocol.     Transaminitis with hepatic steatosis Secondary to alcohol  abuse. Alcohol  cessation.     HIV disease (HCC) Due to noncompliance will not resume antiretrovirals. The patient was asked to follow-up with infectious diseases.     Tobacco abuse nicotine  dependence, cigarettes, uncomplicated Tobacco cessation advised. Nicotine  replacement therapy ordered.     Advance Care Planning:   Code Status: Full Code   Consults:   Family Communication:   Severity of Illness: The appropriate patient status for this patient is INPATIENT. Inpatient status is judged to be reasonable and necessary in order to provide the required intensity of service to ensure the patient's safety. The patient's presenting symptoms, physical exam findings, and initial radiographic and laboratory data in  the context of their chronic comorbidities is felt to place them at high risk for further clinical deterioration. Furthermore, it is not anticipated that the patient will be medically stable for discharge from the hospital within 2 midnights of admission.   * I certify that at the point of admission it is my clinical judgment that the patient will require inpatient hospital care spanning beyond 2 midnights from the point of admission due to high intensity of service, high risk for further deterioration and high frequency of surveillance required.*  Author: Alm Dorn Castor, MD 12/17/2023 2:59 PM  For on call review [email or web address].   This document was prepared using Dragon voice recognition software and may contain some unintended transcription errors.

## 2023-12-18 DIAGNOSIS — Z5900 Homelessness unspecified: Secondary | ICD-10-CM | POA: Diagnosis not present

## 2023-12-18 DIAGNOSIS — B20 Human immunodeficiency virus [HIV] disease: Secondary | ICD-10-CM | POA: Diagnosis not present

## 2023-12-18 DIAGNOSIS — F17210 Nicotine dependence, cigarettes, uncomplicated: Secondary | ICD-10-CM

## 2023-12-18 DIAGNOSIS — F1020 Alcohol dependence, uncomplicated: Secondary | ICD-10-CM | POA: Diagnosis not present

## 2023-12-18 DIAGNOSIS — K852 Alcohol induced acute pancreatitis without necrosis or infection: Secondary | ICD-10-CM | POA: Diagnosis not present

## 2023-12-18 LAB — CBC
HCT: 29.6 % — ABNORMAL LOW (ref 39.0–52.0)
Hemoglobin: 9.7 g/dL — ABNORMAL LOW (ref 13.0–17.0)
MCH: 30.9 pg (ref 26.0–34.0)
MCHC: 32.8 g/dL (ref 30.0–36.0)
MCV: 94.3 fL (ref 80.0–100.0)
Platelets: 191 10*3/uL (ref 150–400)
RBC: 3.14 MIL/uL — ABNORMAL LOW (ref 4.22–5.81)
RDW: 15.8 % — ABNORMAL HIGH (ref 11.5–15.5)
WBC: 3.4 10*3/uL — ABNORMAL LOW (ref 4.0–10.5)
nRBC: 0 % (ref 0.0–0.2)

## 2023-12-18 LAB — COMPREHENSIVE METABOLIC PANEL
ALT: 11 U/L (ref 0–44)
AST: 12 U/L — ABNORMAL LOW (ref 15–41)
Albumin: 3.3 g/dL — ABNORMAL LOW (ref 3.5–5.0)
Alkaline Phosphatase: 42 U/L (ref 38–126)
Anion gap: 7 (ref 5–15)
BUN: 11 mg/dL (ref 6–20)
CO2: 20 mmol/L — ABNORMAL LOW (ref 22–32)
Calcium: 8.5 mg/dL — ABNORMAL LOW (ref 8.9–10.3)
Chloride: 105 mmol/L (ref 98–111)
Creatinine, Ser: 0.49 mg/dL — ABNORMAL LOW (ref 0.61–1.24)
GFR, Estimated: >60 mL/min (ref >60)
Glucose, Bld: 95 mg/dL (ref 70–99)
Potassium: 4 mmol/L (ref 3.5–5.1)
Sodium: 132 mmol/L — ABNORMAL LOW (ref 135–145)
Total Bilirubin: 1 mg/dL (ref 0.0–1.2)
Total Protein: 6.1 g/dL — ABNORMAL LOW (ref 6.5–8.1)

## 2023-12-18 LAB — PROTIME-INR
INR: 1.2 (ref 0.8–1.2)
Prothrombin Time: 15.1 s (ref 11.4–15.2)

## 2023-12-18 LAB — GLUCOSE, CAPILLARY: Glucose-Capillary: 175 mg/dL — ABNORMAL HIGH (ref 70–99)

## 2023-12-18 LAB — LIPASE, BLOOD: Lipase: 154 U/L — ABNORMAL HIGH (ref 11–51)

## 2023-12-18 MED ORDER — LACTATED RINGERS IV SOLN: INTRAVENOUS | Status: AC

## 2023-12-18 MED ORDER — KETOROLAC TROMETHAMINE 15 MG/ML IJ SOLN
15.0000 mg | Freq: Once | INTRAMUSCULAR | Status: AC
  Administered 2023-12-18: 15 mg via INTRAVENOUS
  Filled 2023-12-18: qty 1

## 2023-12-18 MED ORDER — PHENOBARBITAL 32.4 MG PO TABS
32.4000 mg | ORAL_TABLET | Freq: Two times a day (BID) | ORAL | Status: AC
  Administered 2023-12-18 – 2023-12-19 (×3): 32.4 mg via ORAL
  Filled 2023-12-18 (×3): qty 1

## 2023-12-18 MED ORDER — PHENOBARBITAL 32.4 MG PO TABS
16.2000 mg | ORAL_TABLET | Freq: Two times a day (BID) | ORAL | Status: DC
  Administered 2023-12-20 – 2023-12-26 (×13): 16.2 mg via ORAL
  Filled 2023-12-18 (×13): qty 1

## 2023-12-18 NOTE — Progress Notes (Signed)
 PROGRESS NOTE    Gary [name]  FMW:[id number] DOB: [birth year] DOA: 12/17/2023 PCP: Patient, No Pcp Per   Brief Narrative:  Gary [name] is a 38 y.o. male with medical history significant of alcohol  abuse, tobacco abuse, polysubstance abuse, AIDS not on treatment due to noncompliance,history of alcohol  withdrawal and non-withdrawal seizures, bipolar 2 disorder, PTSD, schizophrenia, alcohol  induced pancreatitis(chronic) who was recently admitted and discharged from 12/10/2023 until 12/15/2023 due to chronic alcoholic pancreatitis exacerbation and alcohol  abuse. He was initially tolerating PO well but had increased pain - noted to have started back drinking alcohol  (3 bottles of vodka and 2 bottles of wine per report). Hospitalist called for admission.  Assessment & Plan:   Principal Problem:   Acute alcoholic pancreatitis Active Problems:   Homelessness   Alcohol  use disorder, severe, dependence (HCC)   HIV (human immunodeficiency virus infection) (HCC)   Nicotine  dependence, cigarettes, uncomplicated   Seizure disorder (HCC)   Alcoholic fatty liver   Tobacco abuse   Acute recurrent alcoholic pancreatitis Secondary to noncompliance and alcohol  abuse  Continue IV fluids, clear liquid diet, advance as tolerated -Supportive care ongoing, hold further narcotics given history of polysubstance abuse -Lipase downtrending appropriately   Alcohol  use disorder, severe, dependence CIWA protocol with lorazepam  and phenobarbital  taper. Folate, MVI and thiamine  supplementation ongoing   Seizure disorder (HCC) IV Keppra , transition to p.o. once able to tolerate Phenobarbital  added to withdrawal protocol.   Transaminitis with hepatic steatosis Secondary to alcohol  use and abuse, downtrending appropriately   AIDS, poorly controlled Due to noncompliance will not resume antiretrovirals - have patient follow up with ID for further discussion   Tobacco abuse nicotine  dependence, cigarettes,  uncomplicated Tobacco cessation advised. Nicotine  replacement therapy ordered.  Known history of substance abuse Seeking behavior Avoid narcotics unless absolutely necessary  DVT prophylaxis: SCDs Start: 12/17/23 1503   Code Status:   Code Status: Full Code  Family Communication: None present  Status is: Inpatient  Dispo: The patient is from: Home              Anticipated d/c is to: Same              Anticipated d/c date is: 48 to 72 hours              Patient currently not medically stable for discharge  Consultants:  None  Procedures:  None  Antimicrobials:  None  Subjective: No acute issues or events overnight denies vomiting diarrhea constipation headache fevers chills or chest pain -nausea and abdominal pain reported overnight, appeared to improve this morning  Objective: Vitals:   12/17/23 1906 12/17/23 2221 12/18/23 0245 12/18/23 0623  BP: 103/75 (!) 90/52 105/65 91/60  Pulse: 64 76 (!) 58 (!) 50  Resp: 16 18 16 15   Temp: (!) 97.5 F (36.4 C) 98.1 F (36.7 C) 98 F (36.7 C) 98 F (36.7 C)  TempSrc: Oral Oral Oral Oral  SpO2: 100% 99% 99% 100%  Weight:      Height:        Intake/Output Summary (Last 24 hours) at 12/18/2023 0741 Last data filed at 12/18/2023 0330 Gross per 24 hour  Intake 1859.5 ml  Output 1825 ml  Net 34.5 ml   Filed Weights   12/17/23 0817 12/17/23 1733  Weight: 54 kg 59.1 kg    Examination:  General:  Pleasantly resting in bed, No acute distress. HEENT:  Normocephalic atraumatic.  Sclerae nonicteric, noninjected.  Extraocular movements intact bilaterally. Neck:  Without mass  or deformity.  Trachea is midline. Lungs:  Clear to auscultate bilaterally without rhonchi, wheeze, or rales. Heart:  Regular rate and rhythm.  Without murmurs, rubs, or gallops. Abdomen:  Soft, minimally tender, nondistended.  Without guarding or rebound. Extremities: Without cyanosis, clubbing, edema, or obvious deformity. Skin:  Warm and dry, no  erythema.   Data Reviewed: I have personally reviewed following labs and imaging studies  CBC: Recent Labs  Lab 12/14/23 0425 12/17/23 0927 12/18/23 0427  WBC 4.3 5.2 3.4*  NEUTROABS  --  3.3  --   HGB 10.3* 12.2* 9.7*  HCT 31.4* 37.0* 29.6*  MCV 93.2 91.8 94.3  PLT 199 310 191   Basic Metabolic Panel: Recent Labs  Lab 12/14/23 0425 12/17/23 0927 12/18/23 0427  NA 136 137 132*  K 4.3 4.2 4.0  CL 101 102 105  CO2 28 25 20*  GLUCOSE 106* 91 95  BUN 8 9 11   CREATININE 0.45* 0.61 0.49*  CALCIUM  9.6 9.1 8.5*  MG  --  2.4  --   PHOS  --  3.5  --    GFR: Estimated Creatinine Clearance: 105.7 mL/min (A) (by C-G formula based on SCr of 0.49 mg/dL (L)). Liver Function Tests: Recent Labs  Lab 12/14/23 0425 12/17/23 0927 12/18/23 0427  AST 12* 465* 12*  ALT 13 189* 11  ALKPHOS 40 42 42  BILITOT 0.5 0.8 1.0  PROT 6.2* 7.1 6.1*  ALBUMIN 3.3* 3.4* 3.3*   Recent Labs  Lab 12/17/23 0927 12/18/23 0427  LIPASE 383* 154*   No results for input(s): AMMONIA in the last 168 hours. Coagulation Profile: Recent Labs  Lab 12/18/23 0427  INR 1.2   Cardiac Enzymes: No results for input(s): CKTOTAL, CKMB, CKMBINDEX, TROPONINI in the last 168 hours. BNP (last 3 results) No results for input(s): PROBNP in the last 8760 hours. HbA1C: No results for input(s): HGBA1C in the last 72 hours. CBG: No results for input(s): GLUCAP in the last 168 hours. Lipid Profile: No results for input(s): CHOL, HDL, LDLCALC, TRIG, CHOLHDL, LDLDIRECT in the last 72 hours. Thyroid  Function Tests: No results for input(s): TSH, T4TOTAL, FREET4, T3FREE, THYROIDAB in the last 72 hours. Anemia Panel: No results for input(s): VITAMINB12, FOLATE, FERRITIN, TIBC, IRON, RETICCTPCT in the last 72 hours. Sepsis Labs: No results for input(s): PROCALCITON, LATICACIDVEN in the last 168 hours.  No results found for this or any previous visit (from the  past 240 hours).       Radiology Studies: CT ABDOMEN PELVIS W CONTRAST Result Date: 12/17/2023 CLINICAL DATA:  Abdominal pain, acute, nonlocalized. Abdominal pain. Vomiting. EXAM: CT ABDOMEN AND PELVIS WITH CONTRAST TECHNIQUE: Multidetector CT imaging of the abdomen and pelvis was performed using the standard protocol following bolus administration of intravenous contrast. RADIATION DOSE REDUCTION: This exam was performed according to the departmental dose-optimization program which includes automated exposure control, adjustment of the mA and/or kV according to patient size and/or use of iterative reconstruction technique. CONTRAST:  OMNIPAQUE  IOHEXOL  300 MG/ML  SOLN COMPARISON:  CT scan abdomen and pelvis from 12/05/2023. FINDINGS: Lower chest: Linear area of atelectasis/scarring noted in the left lower lobe. The lung bases are otherwise clear. No pleural effusion. The heart is normal in size. No pericardial effusion. Hepatobiliary: The liver is normal in size. Non-cirrhotic configuration. No suspicious mass. These is diffuse hepatic steatosis. No intrahepatic or extrahepatic bile duct dilation. No calcified gallstones. Normal gallbladder wall thickness. No pericholecystic inflammatory changes. Pancreas: There is mild-to-moderate fat stranding surrounding the  pancreatic head/uncinate process, slightly less pronounced when compared to the prior exam. Redemonstration of at least 2, ill-defined hypoattenuating peripancreatic areas abutting the pancreas measuring 1.0 x 1.3 cm orthogonally on coronal plane in the pancreatic head region anteriorly and 1.0 x 1.2 cm orthogonally on coronal plane in the pancreatic head region, posteriorly. These areas were present on the prior examination but appears slightly enlarged. No discrete wall seen. There is also moderate narrowing of the superior mesenteric vein just before the confluence to form portal vein (which has increased since the prior study). There is  mild-to-moderate narrowing of the proximal portion of portal vein (series 8, image 66), which also has increased since the prior study. Small/atrophic pancreatic body and absent pancreatic tail noted. Spleen: Within normal limits. No focal lesion. Adrenals/Urinary Tract: Adrenal glands are unremarkable. No suspicious renal mass. There is a stable subcentimeter simple cyst in the right kidney lower pole no hydronephrosis. No renal or ureteric calculi. Urinary bladder is under distended, precluding optimal assessment. However, no large mass or stones identified. No perivesical fat stranding. Stomach/Bowel: No disproportionate dilation of the small or large bowel loops. No evidence of abnormal bowel wall thickening or inflammatory changes. The appendix is unremarkable. Vascular/Lymphatic: No ascites or pneumoperitoneum. No abdominal or pelvic lymphadenopathy, by size criteria. No aneurysmal dilation of the major abdominal arteries. Reproductive: Enlarged prostate. Symmetric seminal vesicles. Other: There is a tiny fat containing umbilical hernia. The soft tissues and abdominal wall are otherwise unremarkable. Musculoskeletal: No suspicious osseous lesions. IMPRESSION: 1. Redemonstration of fat stranding surrounding the pancreatic head/uncinate process and small amount of non walled-off fluid in the peripancreatic region. Findings favor sequela of pancreatitis with peripancreatic collections/pseudocyst. Correlate with serum lipase levels. 2. There is increasing mass effect with moderate narrowing of the superior mesenteric vein and mild-to-moderate narrowing of the proximal portal vein, as discussed above. No thrombosis. 3. No other acute inflammatory process identified within the abdomen or pelvis. 4. Multiple other nonacute observations, as described above. Electronically Signed   By: Ree Molt M.D.   On: 12/17/2023 14:20        Scheduled Meds:  feeding supplement  1 Container Oral TID BM   folic acid   1  mg Oral Daily   LORazepam   0-4 mg Oral Q4H   Followed by   NOREEN ON 12/19/2023] LORazepam   0-4 mg Oral Q8H   multivitamin with minerals  1 tablet Oral Daily   pantoprazole  (PROTONIX ) IV  40 mg Intravenous Daily   [START ON 12/19/2023] PHENobarbital   16.2 mg Oral BID   phenobarbital   32.4 mg Oral BID   PHENobarbital   64.8 mg Oral BID   thiamine   100 mg Oral Daily   Or   thiamine   100 mg Intravenous Daily   Continuous Infusions:  dextrose  5 % and 0.9 % NaCl 63 mL/hr at 12/17/23 2136   levETIRAcetam  500 mg (12/17/23 2116)     LOS: 1 day    Time spent:    Elsie JAYSON Montclair, DO [facility]  If 7PM-7AM, please contact night-coverage [email or web address]  12/18/2023, 7:41 AM

## 2023-12-19 DIAGNOSIS — Z5900 Homelessness unspecified: Secondary | ICD-10-CM

## 2023-12-19 DIAGNOSIS — B20 Human immunodeficiency virus [HIV] disease: Secondary | ICD-10-CM

## 2023-12-19 DIAGNOSIS — F1020 Alcohol dependence, uncomplicated: Secondary | ICD-10-CM

## 2023-12-19 DIAGNOSIS — Z720 Tobacco use: Secondary | ICD-10-CM

## 2023-12-19 DIAGNOSIS — K852 Alcohol induced acute pancreatitis without necrosis or infection: Secondary | ICD-10-CM | POA: Diagnosis not present

## 2023-12-19 DIAGNOSIS — K700 Alcoholic fatty liver: Secondary | ICD-10-CM

## 2023-12-19 DIAGNOSIS — G40909 Epilepsy, unspecified, not intractable, without status epilepticus: Secondary | ICD-10-CM

## 2023-12-19 LAB — GASTROINTESTINAL PANEL BY PCR, STOOL (REPLACES STOOL CULTURE)

## 2023-12-19 LAB — LIPASE, BLOOD: Lipase: 158 U/L — ABNORMAL HIGH (ref 11–51)

## 2023-12-19 MED ORDER — PANTOPRAZOLE SODIUM 40 MG PO TBEC
40.0000 mg | DELAYED_RELEASE_TABLET | Freq: Every day | ORAL | Status: DC
  Administered 2023-12-20 – 2023-12-27 (×8): 40 mg via ORAL
  Filled 2023-12-19 (×8): qty 1

## 2023-12-19 NOTE — Progress Notes (Signed)
 Pt refused PO ativan  during the night.

## 2023-12-19 NOTE — Progress Notes (Signed)
 PROGRESS NOTE    Gary [name]  FMW:[id number] DOB: 02-Apr-1986 DOA: 12/17/2023 PCP: Patient, No Pcp Per   Brief Narrative:  Gary [name] is a 38 y.o. male with medical history significant of alcohol  abuse, tobacco abuse, polysubstance abuse, AIDS not on treatment due to noncompliance,history of alcohol  withdrawal and non-withdrawal seizures, bipolar 2 disorder, PTSD, schizophrenia, alcohol  induced pancreatitis(chronic) who was recently admitted and discharged from 12/10/2023 until 12/15/2023 due to chronic alcoholic pancreatitis exacerbation and alcohol  abuse. He was initially tolerating PO well but had increased pain - noted to have started back drinking alcohol  (3 bottles of vodka and 2 bottles of wine per report). Hospitalist called for admission.  Assessment & Plan:   Principal Problem:   Acute alcoholic pancreatitis Active Problems:   Homelessness   Alcohol  use disorder, severe, dependence (HCC)   HIV (human immunodeficiency virus infection) (HCC)   Nicotine  dependence, cigarettes, uncomplicated   Seizure disorder (HCC)   Alcoholic fatty liver   Tobacco abuse   Acute recurrent alcoholic pancreatitis Secondary to noncompliance and alcohol  abuse  Continue IV fluids, advance diet as tolerated -Supportive care ongoing, hold further narcotics given history of polysubstance abuse and recurrent hospitalizations after profound non-compliance(concerning for self harm to obtain narcotics although he denies this). -Lipase downtrending appropriately   Alcohol  use disorder, severe, dependence CIWA protocol with lorazepam  and phenobarbital  taper. Folate, MVI and thiamine  supplementation ongoing Patient indicates he will need lifelong benzos/pain medicine to keep him from having withdrawals - educated that withdrawals are short-lived and once he is detoxed he would not need chronic medications.   Seizure disorder (HCC) IV Keppra , transition to p.o. once able to tolerate Phenobarbital   added to withdrawal protocol.   Transaminitis with hepatic steatosis Secondary to alcohol  use and abuse, downtrending appropriately   AIDS, poorly controlled Due to noncompliance will not resume antiretrovirals - have patient follow up with ID for further discussion   Tobacco abuse nicotine  dependence, cigarettes, uncomplicated Tobacco cessation advised. Nicotine  replacement therapy ordered.  Known history of substance abuse Seeking behavior Avoid narcotics unless absolutely necessary  DVT prophylaxis: SCDs Start: 12/17/23 1503   Code Status:   Code Status: Full Code  Family Communication: None present  Status is: Inpatient  Dispo: The patient is from: Home              Anticipated d/c is to: Same              Anticipated d/c date is: 48 to 72 hours              Patient currently not medically stable for discharge  Consultants:  None  Procedures:  None  Antimicrobials:  None  Subjective: No acute issues or events noted overnight, patient continues to refuse p.o. medications requesting IV only which we discussed is not appropriate given his ability to tolerate p.o. well.  On a low-fat diet today and tolerating well.  Objective: Vitals:   12/18/23 2217 12/18/23 2349 12/19/23 0107 12/19/23 0423  BP: 103/76 123/75 (!) 111/50 104/66  Pulse: (!) 58 62 65 62  Resp: 17  20 13   Temp: 98.8 F (37.1 C)   98.1 F (36.7 C)  TempSrc: Oral   Oral  SpO2: 100%   100%  Weight:      Height:        Intake/Output Summary (Last 24 hours) at 12/19/2023 0740 Last data filed at 12/19/2023 0323 Gross per 24 hour  Intake 2032.4 ml  Output 3875 ml  Net -1842.6  ml   Filed Weights   12/17/23 0817 12/17/23 1733  Weight: 54 kg 59.1 kg    Examination:  General:  Pleasantly resting in bed, No acute distress. HEENT:  Normocephalic atraumatic.  Sclerae nonicteric, noninjected.  Extraocular movements intact bilaterally. Neck:  Without mass or deformity.  Trachea is midline. Lungs:   Clear to auscultate bilaterally without rhonchi, wheeze, or rales. Heart:  Regular rate and rhythm.  Without murmurs, rubs, or gallops. Abdomen:  Soft, minimally tender, nondistended.  Without guarding or rebound. Extremities: Without cyanosis, clubbing, edema, or obvious deformity. Skin:  Warm and dry, no erythema.   Data Reviewed: I have personally reviewed following labs and imaging studies  CBC: Recent Labs  Lab 12/14/23 0425 12/17/23 0927 12/18/23 0427  WBC 4.3 5.2 3.4*  NEUTROABS  --  3.3  --   HGB 10.3* 12.2* 9.7*  HCT 31.4* 37.0* 29.6*  MCV 93.2 91.8 94.3  PLT 199 310 191   Basic Metabolic Panel: Recent Labs  Lab 12/14/23 0425 12/17/23 0927 12/18/23 0427  NA 136 137 132*  K 4.3 4.2 4.0  CL 101 102 105  CO2 28 25 20*  GLUCOSE 106* 91 95  BUN 8 9 11   CREATININE 0.45* 0.61 0.49*  CALCIUM  9.6 9.1 8.5*  MG  --  2.4  --   PHOS  --  3.5  --    GFR: Estimated Creatinine Clearance: 105.7 mL/min (A) (by C-G formula based on SCr of 0.49 mg/dL (L)). Liver Function Tests: Recent Labs  Lab 12/14/23 0425 12/17/23 0927 12/18/23 0427  AST 12* 465* 12*  ALT 13 189* 11  ALKPHOS 40 42 42  BILITOT 0.5 0.8 1.0  PROT 6.2* 7.1 6.1*  ALBUMIN 3.3* 3.4* 3.3*   Recent Labs  Lab 12/17/23 0927 12/18/23 0427 12/19/23 0439  LIPASE 383* 154* 158*   No results for input(s): AMMONIA in the last 168 hours. Coagulation Profile: Recent Labs  Lab 12/18/23 0427  INR 1.2   Cardiac Enzymes: No results for input(s): CKTOTAL, CKMB, CKMBINDEX, TROPONINI in the last 168 hours. BNP (last 3 results) No results for input(s): PROBNP in the last 8760 hours. HbA1C: No results for input(s): HGBA1C in the last 72 hours. CBG: Recent Labs  Lab 12/18/23 1232  GLUCAP 175*   Lipid Profile: No results for input(s): CHOL, HDL, LDLCALC, TRIG, CHOLHDL, LDLDIRECT in the last 72 hours. Thyroid  Function Tests: No results for input(s): TSH, T4TOTAL, FREET4,  T3FREE, THYROIDAB in the last 72 hours. Anemia Panel: No results for input(s): VITAMINB12, FOLATE, FERRITIN, TIBC, IRON, RETICCTPCT in the last 72 hours. Sepsis Labs: No results for input(s): PROCALCITON, LATICACIDVEN in the last 168 hours.  No results found for this or any previous visit (from the past 240 hours).       Radiology Studies: CT ABDOMEN PELVIS W CONTRAST Result Date: 12/17/2023 CLINICAL DATA:  Abdominal pain, acute, nonlocalized. Abdominal pain. Vomiting. EXAM: CT ABDOMEN AND PELVIS WITH CONTRAST TECHNIQUE: Multidetector CT imaging of the abdomen and pelvis was performed using the standard protocol following bolus administration of intravenous contrast. RADIATION DOSE REDUCTION: This exam was performed according to the departmental dose-optimization program which includes automated exposure control, adjustment of the mA and/or kV according to patient size and/or use of iterative reconstruction technique. CONTRAST:  OMNIPAQUE  IOHEXOL  300 MG/ML  SOLN COMPARISON:  CT scan abdomen and pelvis from 12/05/2023. FINDINGS: Lower chest: Linear area of atelectasis/scarring noted in the left lower lobe. The lung bases are otherwise clear. No pleural effusion.  The heart is normal in size. No pericardial effusion. Hepatobiliary: The liver is normal in size. Non-cirrhotic configuration. No suspicious mass. These is diffuse hepatic steatosis. No intrahepatic or extrahepatic bile duct dilation. No calcified gallstones. Normal gallbladder wall thickness. No pericholecystic inflammatory changes. Pancreas: There is mild-to-moderate fat stranding surrounding the pancreatic head/uncinate process, slightly less pronounced when compared to the prior exam. Redemonstration of at least 2, ill-defined hypoattenuating peripancreatic areas abutting the pancreas measuring 1.0 x 1.3 cm orthogonally on coronal plane in the pancreatic head region anteriorly and 1.0 x 1.2 cm orthogonally on coronal  plane in the pancreatic head region, posteriorly. These areas were present on the prior examination but appears slightly enlarged. No discrete wall seen. There is also moderate narrowing of the superior mesenteric vein just before the confluence to form portal vein (which has increased since the prior study). There is mild-to-moderate narrowing of the proximal portion of portal vein (series 8, image 66), which also has increased since the prior study. Small/atrophic pancreatic body and absent pancreatic tail noted. Spleen: Within normal limits. No focal lesion. Adrenals/Urinary Tract: Adrenal glands are unremarkable. No suspicious renal mass. There is a stable subcentimeter simple cyst in the right kidney lower pole no hydronephrosis. No renal or ureteric calculi. Urinary bladder is under distended, precluding optimal assessment. However, no large mass or stones identified. No perivesical fat stranding. Stomach/Bowel: No disproportionate dilation of the small or large bowel loops. No evidence of abnormal bowel wall thickening or inflammatory changes. The appendix is unremarkable. Vascular/Lymphatic: No ascites or pneumoperitoneum. No abdominal or pelvic lymphadenopathy, by size criteria. No aneurysmal dilation of the major abdominal arteries. Reproductive: Enlarged prostate. Symmetric seminal vesicles. Other: There is a tiny fat containing umbilical hernia. The soft tissues and abdominal wall are otherwise unremarkable. Musculoskeletal: No suspicious osseous lesions. IMPRESSION: 1. Redemonstration of fat stranding surrounding the pancreatic head/uncinate process and small amount of non walled-off fluid in the peripancreatic region. Findings favor sequela of pancreatitis with peripancreatic collections/pseudocyst. Correlate with serum lipase levels. 2. There is increasing mass effect with moderate narrowing of the superior mesenteric vein and mild-to-moderate narrowing of the proximal portal vein, as discussed above.  No thrombosis. 3. No other acute inflammatory process identified within the abdomen or pelvis. 4. Multiple other nonacute observations, as described above. Electronically Signed   By: Ree Molt M.D.   On: 12/17/2023 14:20        Scheduled Meds:  feeding supplement  1 Container Oral TID BM   folic acid   1 mg Oral Daily   LORazepam   0-4 mg Oral Q4H   Followed by   LORazepam   0-4 mg Oral Q8H   multivitamin with minerals  1 tablet Oral Daily   pantoprazole  (PROTONIX ) IV  40 mg Intravenous Daily   [START ON 12/20/2023] PHENobarbital   16.2 mg Oral BID   phenobarbital   32.4 mg Oral BID   thiamine   100 mg Oral Daily   Or   thiamine   100 mg Intravenous Daily   Continuous Infusions:  levETIRAcetam  500 mg (12/18/23 2224)     LOS: 2 days    Time spent:    Elsie JAYSON Montclair, DO [facility]  If 7PM-7AM, please contact night-coverage [email or web address]  12/19/2023, 7:40 AM

## 2023-12-19 NOTE — Progress Notes (Signed)
 Patient constantly on call light wanting pain medicine and IV ativan .  I have had numerous conversations with patient regarding what and when he can have it.  Patient is refusing tylenol . He is threatening to sue hospital and kick Dr.s ass.  Patient stated he would throw a big enough fit, jerk IV's out, throw IV pole in the hall until we had to give him medicine to knock him out.

## 2023-12-20 DIAGNOSIS — F10939 Alcohol use, unspecified with withdrawal, unspecified: Secondary | ICD-10-CM | POA: Diagnosis not present

## 2023-12-20 DIAGNOSIS — Z5900 Homelessness unspecified: Secondary | ICD-10-CM | POA: Diagnosis not present

## 2023-12-20 DIAGNOSIS — F411 Generalized anxiety disorder: Secondary | ICD-10-CM | POA: Diagnosis present

## 2023-12-20 DIAGNOSIS — B20 Human immunodeficiency virus [HIV] disease: Secondary | ICD-10-CM | POA: Diagnosis not present

## 2023-12-20 DIAGNOSIS — F1020 Alcohol dependence, uncomplicated: Secondary | ICD-10-CM | POA: Diagnosis not present

## 2023-12-20 DIAGNOSIS — K852 Alcohol induced acute pancreatitis without necrosis or infection: Secondary | ICD-10-CM | POA: Diagnosis not present

## 2023-12-20 LAB — LIPASE, BLOOD: Lipase: 140 U/L — ABNORMAL HIGH (ref 11–51)

## 2023-12-20 MED ORDER — LEVETIRACETAM 500 MG PO TABS
500.0000 mg | ORAL_TABLET | Freq: Two times a day (BID) | ORAL | Status: DC
  Administered 2023-12-20 – 2023-12-27 (×15): 500 mg via ORAL
  Filled 2023-12-20 (×15): qty 1

## 2023-12-20 NOTE — Consult Note (Signed)
[facility] Psychiatric Consult Initial  Patient Name: .Gary [name]  MRN: [id number]  DOB: 01-26-1986  Consult Order details:  Orders (From admission, onward)     Start     Ordered   12/20/23 0736  IP CONSULT TO PSYCHIATRY       Comments: Recurrent admission with ?intentional self harm consuming alcohol with known pancreatitis in hopes to obtain narcotics. Desperately needs outpatient follow up  Ordering Provider: Azucena Fallen, MD  Provider:  (Not yet assigned)  Question Answer Comment  Location [facility]   Reason for Consult? Abnormal/aggressive behavoir      12/20/23 0736             Mode of Visit: In person    Psychiatry Consult Evaluation  Service Date: December 20, 2023 LOS:  LOS: 3 days  Chief Complaint "My stomach hurts like hell."  Primary Psychiatric Diagnoses  Alcohol withdrawal with complications 2.  Polysubstance use disorder, including opiates 3.  Anxiety  Assessment  Gary [name] is a 38 y.o. male admitted: Medicallyfor 12/17/2023  8:08 AM for abdominal pain and nausea and vomiting. He carries the psychiatric diagnoses of polysubstance dependence including opiates and has a past medical history of pancreatitis, HIV (noncompliant with meds), pancytopenia, constipation, seizure d/o  His current presentation of irritability and anxiety is most consistent with alcohol withdrawal symptoms. Current outpatient psychotropic medications include none. On initial examination, patient was irritable and anxious. Please see plan below for detailed recommendations.   Diagnoses:  Active Hospital problems: Principal Problem:   Acute alcoholic pancreatitis Active Problems:   Alcohol use disorder, severe, dependence (HCC)   HIV (human immunodeficiency virus infection) (HCC)   Nicotine dependence, cigarettes, uncomplicated   Homelessness   Seizure disorder (HCC)   Alcoholic fatty liver   Anxiety state    Plan   ## Psychiatric  Medication Recommendations:  Recommended gabapentin 300 mg TID but client declined  ## Medical Decision Making Capacity: Not specifically addressed in this encounter  ## Further Work-up:  -- none -- most recent EKG on 12/17/23 had QtC of 472 -- Pertinent labwork reviewed earlier this admission includes: CMP, HIV labs, GI panel, PT, glucose, EKG, CBC, hepatitis panels   ## Disposition:-- There are no psychiatric contraindications to discharge at this time  ## Behavioral / Environmental: -Utilize compassion and acknowledge the patient's experiences while setting clear and realistic expectations for care.    ## Safety and Observation Level:  - Based on my clinical evaluation, I estimate the patient to be at low risk of self harm in the current setting. - At this time, we recommend  routine. This decision is based on my review of the chart including patient's history and current presentation, interview of the patient, mental status examination, and consideration of suicide risk including evaluating suicidal ideation, plan, intent, suicidal or self-harm behaviors, risk factors, and protective factors. This judgment is based on our ability to directly address suicide risk, implement suicide prevention strategies, and develop a safety plan while the patient is in the clinical setting. Please contact our team if there is a concern that risk level has changed.  CSSR Risk Category:C-SSRS RISK CATEGORY: Error: Q3, 4, or 5 should not be populated when Q2 is No  Suicide Risk Assessment: Patient has following modifiable risk factors for suicide is low: no past suicide attempts, denies depression, no psychiatric admissions which we are addressing with the Ativan/phenobarbital detox protocol. Patient has following non-modifiable or demographic risk factors for suicide:  male gender Patient has the following protective factors against suicide: no history of suicide attempts and no history of NSSIB  Thank you  for this consult request. Recommendations have been communicated to the primary team.  We will sign off at this time.   Nanine Means, NP       History of Present Illness  Relevant Aspects of [facility] Course:  Admitted on 12/17/2023 for abdominal pain and alcohol withdrawal. They are being treated for both.  Multiple hospitalizations per the chart for similar presentations.   Patient Report:  "My stomach hurts like hell" were the first words used upon entering his room.  When this provider introduced herself, he responded with, "I don't need psych.  I don't need psych meds".  He was irritable and denied depression and suicidal ideations, no hallucinations.  His anxiety was high along with cravings for alcohol and sweats with tremors that he described when discussing withdrawal symptoms.  "I'm always like this.  I had these symptoms (sweating and tremors) in prison for a month."  His appetite is decreased related to his abdominal pains, sleep was fair.  He was upset that he is not receiving narcotics and threatening to sue everyone.  When explored medications to assist with his anxiety, he stated, "I need alcohol or benzodiazepines, that's the only thing that helps".  He would not entertain another option despite explaining that benzodiazepines operate in the brain the same way alcohol does and they would not be prescribed outside of his detox taper, which he voiced "Thanks for nothing".  He did stated he was drinking two fifths of vodka daily prior to admission along with some meth and cocaine use.  Discussed rehab which he was only interested in if he got a prescription for benzodiazepines, this provider explained this would not be ordered and was exclusionary criteria for rehabs.  The client continued to push for benzodiazepines throughout the assessment, politely declined his requests.    Psych ROS:  Depression: denied Anxiety:  high Mania (lifetime and current): denied Psychosis: (lifetime  and current): psychosis when using meth at times  Review of Systems  Constitutional:  Positive for malaise/fatigue.  HENT: Negative.    Eyes: Negative.   Respiratory: Negative.    Cardiovascular: Negative.   Gastrointestinal:  Positive for abdominal pain and nausea.  Genitourinary: Negative.   Musculoskeletal: Negative.   Skin: Negative.   Neurological: Negative.   Endo/Heme/Allergies: Negative.   Psychiatric/Behavioral:  Positive for substance abuse. The patient is nervous/anxious.      Psychiatric and Social History  Psychiatric History:  Information collected from chart and patient.  Prev Dx/Sx: polysubstance dependency, alcohol dependency with complicated withdrawal Current Psych Provider: none Home Meds (current): none, noncompliant with medical medications, including his HIV ones Previous Med Trials: gabapentin, propranolol, hydroxyzine Therapy: none currently  Prior Psych Hospitalization: denied  Prior Self Harm: denied Prior Violence: denied  Family Psych History: substance abuse Family Hx suicide: none  Social History:  Legal Hx: past issues with prison time Living Situation: homeless shelter  Access to weapons/lethal means: denies   Substance History Alcohol: 2 1/5s of vodka daily  Type of alcohol vodka Last Drink the day before admission Number of drinks per day 1, 1/5s of vodka History of alcohol withdrawal seizures yes History of DT's yes Tobacco: yes Illicit drugs: meth, cocaine Prescription drug abuse: if available Rehab hx: [facility]  Exam Findings   Vital Signs:  Temp:  [98.4 F (36.9 C)-98.7 F (37.1 C)] 98.7 F (  37.1 C) (02/10 0641) Pulse Rate:  [52-78] 62 (02/10 0641) Resp:  [16-20] 16 (02/10 0641) BP: (100-104)/(63-81) 104/77 (02/10 0641) SpO2:  [99 %-100 %] 100 % (02/10 0641) Blood pressure 104/77, pulse 62, temperature 98.7 F (37.1 C), temperature source Oral, resp. rate 16, height 6' (1.829 m), weight 59.1 kg, SpO2 100%. Body mass  index is 17.67 kg/m.  Physical Exam Vitals and nursing note reviewed.  HENT:     Head: Normocephalic.  Pulmonary:     Effort: Pulmonary effort is normal.  Neurological:     General: No focal deficit present.     Mental Status: He is alert and oriented to person, place, and time.    Mental Status Exam: General Appearance: emaciated   Orientation:  Full (Time, Place, and Person)  Memory:  Immediate;   Good Recent;   Good Remote;   Good  Concentration:  Concentration: Fair and Attention Span: Fair  Recall:  Good  Attention  Fair  Eye Contact:  Good  Speech:  Normal Rate  Language:  Good  Volume:  Normal  Mood: irritable, angry  Affect:  Congruent  Thought Process:  Coherent  Thought Content:  Logical  Suicidal Thoughts:  No  Homicidal Thoughts:  No  Judgement:  Fair  Insight:  Lacking  Psychomotor Activity:  Decreased  Akathisia:  No  Fund of Knowledge:  Fair      Assets:  Resilience  Cognition:  WNL  ADL's:  Intact  AIMS (if indicated):        Other History   These have been pulled in through the EMR, reviewed, and updated if appropriate.  Family History:  The patient's family history includes Alcohol abuse in his father and mother; Cancer in an other family member; Colon cancer in an other family member; Other in an other family member.  Medical History: Past Medical History:  Diagnosis Date   Alcohol abuse    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   Anxiety    Bipolar 2 disorder (HCC)    HIV (human immunodeficiency virus infection) (HCC)    Pancreatitis    PTSD (post-traumatic stress disorder)    Schizophrenia (HCC)    Seizures (HCC)    Subdural hematoma (HCC)     Surgical History: Past Surgical History:  Procedure Laterality Date   BIOPSY  04/19/2022   Procedure: BIOPSY;  Surgeon: Lemar Lofty., MD;  Location: [facility] ENDOSCOPY;  Service: Gastroenterology;;   ENTEROSCOPY N/A 04/19/2022   Procedure: ENTEROSCOPY;  Surgeon: Lemar Lofty., MD;  Location: [facility] ENDOSCOPY;  Service: Gastroenterology;  Laterality: N/A;   INCISION AND DRAINAGE PERIRECTAL ABSCESS N/A 09/24/2016   Procedure: IRRIGATION AND DEBRIDEMENT PERIRECTAL ABSCESS;  Surgeon: Ricarda Frame, MD;  Location: [facility] ORS;  Service: General;  Laterality: N/A;   none       Medications:   Current Facility-Administered Medications:    acetaminophen (TYLENOL) tablet 650 mg, 650 mg, Oral, Q6H PRN, 650 mg at 12/18/23 1019 **OR** acetaminophen (TYLENOL) suppository 650 mg, 650 mg, Rectal, Q6H PRN, Bobette Mo, MD   feeding supplement (BOOST / RESOURCE BREEZE) liquid 1 Container, 1 Container, Oral, TID BM, Bobette Mo, MD, 1 Container at 12/19/23 2130   folic acid (FOLVITE) tablet 1 mg, 1 mg, Oral, Daily, Bobette Mo, MD, 1 mg at 12/19/23 1124   levETIRAcetam (KEPPRA) tablet 500 mg, 500 mg, Oral, BID, Azucena Fallen, MD   [EXPIRED] LORazepam (ATIVAN) tablet 0-4 mg, 0-4 mg, Oral, Q4H, 2 mg at 12/19/23 1401 **FOLLOWED  BY** LORazepam (ATIVAN) tablet 0-4 mg, 0-4 mg, Oral, Q8H, Bobette Mo, MD, 2 mg at 12/19/23 2130   LORazepam (ATIVAN) tablet 1-4 mg, 1-4 mg, Oral, Q1H PRN, 2 mg at 12/20/23 1018 **OR** [DISCONTINUED] LORazepam (ATIVAN) injection 1-4 mg, 1-4 mg, Intravenous, Q1H PRN, Bobette Mo, MD, 2 mg at 12/19/23 0515   multivitamin with minerals tablet 1 tablet, 1 tablet, Oral, Daily, Bobette Mo, MD, 1 tablet at 12/19/23 1124   ondansetron (ZOFRAN) tablet 4 mg, 4 mg, Oral, Q6H PRN **OR** ondansetron (ZOFRAN) injection 4 mg, 4 mg, Intravenous, Q6H PRN, Bobette Mo, MD, 4 mg at 12/18/23 2235   pantoprazole (PROTONIX) EC tablet 40 mg, 40 mg, Oral, Daily, Norva Pavlov, RPH, 40 mg at 12/20/23 1019   PHENobarbital (LUMINAL) tablet 16.2 mg, 16.2 mg, Oral, BID, Wofford, Drew A, RPH, 16.2 mg at 12/20/23 1017   thiamine (VITAMIN B1) tablet 100 mg, 100 mg, Oral, Daily, 100 mg at 12/19/23 1123 **OR** [DISCONTINUED]  thiamine (VITAMIN B1) injection 100 mg, 100 mg, Intravenous, Daily, Bobette Mo, MD, 100 mg at 12/17/23 1540  Allergies: Allergies  Allergen Reactions   Tegretol [Carbamazepine] Other (See Comments)    Vertigo and paralysis   Caffeine Palpitations    Nanine Means, NP

## 2023-12-20 NOTE — Progress Notes (Signed)
 Patient refuses to wear telemetry.  Daniels,NP notified

## 2023-12-20 NOTE — Progress Notes (Signed)
 PROGRESS NOTE    [name]  ZOX:[id number] DOB: [birth year] DOA: 12/17/2023 PCP: Patient, No Pcp Per   Brief Narrative:  Gary [name] is a 38 y.o. male with medical history significant of alcohol abuse, tobacco abuse, polysubstance abuse, AIDS not on treatment due to noncompliance,history of alcohol withdrawal and non-withdrawal seizures, bipolar 2 disorder, PTSD, schizophrenia, alcohol induced pancreatitis(chronic) who was recently admitted and discharged from 12/10/2023 until 12/15/2023 due to chronic alcoholic pancreatitis exacerbation and alcohol abuse. He was initially tolerating PO well but had increased pain - noted to have started back drinking alcohol (3 bottles of vodka and 2 bottles of wine per report). Hospitalist called for admission.  Assessment & Plan:   Principal Problem:   Acute alcoholic pancreatitis Active Problems:   Homelessness   Alcohol use disorder, severe, dependence (HCC)   HIV (human immunodeficiency virus infection) (HCC)   Nicotine  dependence, cigarettes, uncomplicated   Seizure disorder (HCC)   Alcoholic fatty liver   Tobacco abuse  Recurrent hospitalizations, concern for self-harm to gain access to controlled substances History of major depressive disorder, rule out undiagnosed anxiety -Multiple hospitalizations despite education that ongoing alcohol use will cause worsening pancreatitis and pain. -Concern patient is intentionally harming himself to obtain controlled substances at our facility -Denies previous psychiatric follow-up -Patient lashing out 12/19/2023 threatening staff if he does not receive IV narcotics and IV benzos despite having p.o. medications available and is tolerating a diet without difficulty. -Appreciate psychiatric help with this difficult situation  Acute recurrent alcoholic pancreatitis Secondary to noncompliance and alcohol abuse  DC IV fluids, advance diet as tolerated -Supportive care ongoing, hold further narcotics given  history of polysubstance abuse and recurrent hospitalizations after profound non-compliance(concerning for self harm to obtain narcotics although he denies this). -Lipase downtrending appropriately albeit slowly   Alcohol use disorder, severe, dependence CIWA protocol with lorazepam  and phenobarbital  taper. Folate, MVI and thiamine  supplementation ongoing Patient indicates he will need lifelong benzos/pain medicine to keep him from having withdrawals - educated that withdrawals are short-lived and once he is detoxed he would not need chronic medications.   Seizure disorder (HCC) IV Keppra , transition to p.o. once able to tolerate Phenobarbital  added to withdrawal protocol.   Transaminitis with hepatic steatosis Secondary to alcohol use and abuse, downtrending appropriately   AIDS, poorly controlled Due to noncompliance will not resume antiretrovirals - have patient follow up with ID for further discussion   Tobacco abuse nicotine  dependence, cigarettes, uncomplicated Tobacco cessation advised. Nicotine  replacement therapy ordered.  Known history of substance abuse Seeking behavior Avoid narcotics unless absolutely necessary as above  DVT prophylaxis: SCDs Start: 12/17/23 1503 Code Status:   Code Status: Full Code Family Communication: None present  Status is: Inpatient  Dispo: The patient is from: Home              Anticipated d/c is to: Same              Anticipated d/c date is: 48 to 72 hours              Patient currently not medically stable for discharge  Consultants:  None  Procedures:  None  Antimicrobials:  None  Subjective: No acute issues or events noted overnight, patient continues to refuse p.o. medications requesting IV only which we discussed is not appropriate given his ability to tolerate p.o. well.  On a low-fat diet today and tolerating well.  Objective: Vitals:   12/19/23 1345 12/19/23 1946 12/19/23 2339 12/20/23 8119  BP: 100/63 100/81  104/77   Pulse: 67 78 (!) 52 62  Resp:  20  16  Temp: 98.6 F (37 C) 98.4 F (36.9 C)  98.7 F (37.1 C)  TempSrc: Oral Oral  Oral  SpO2: 99% 100%  100%  Weight:      Height:       No intake or output data in the 24 hours ending 12/20/23 0735  Filed Weights   12/17/23 0817 12/17/23 1733  Weight: 54 kg 59.1 kg    Examination:  General:  Pleasantly resting in bed, No acute distress. HEENT:  Normocephalic atraumatic.  Sclerae nonicteric, noninjected.  Extraocular movements intact bilaterally. Neck:  Without mass or deformity.  Trachea is midline. Lungs:  Clear to auscultate bilaterally without rhonchi, wheeze, or rales. Heart:  Regular rate and rhythm.  Without murmurs, rubs, or gallops. Abdomen:  Soft, minimally tender, nondistended.  Without guarding or rebound. Extremities: Without cyanosis, clubbing, edema, or obvious deformity. Skin:  Warm and dry, no erythema.   Data Reviewed: I have personally reviewed following labs and imaging studies  CBC: Recent Labs  Lab 12/14/23 0425 12/17/23 0927 12/18/23 0427  WBC 4.3 5.2 3.4*  NEUTROABS  --  3.3  --   HGB 10.3* 12.2* 9.7*  HCT 31.4* 37.0* 29.6*  MCV 93.2 91.8 94.3  PLT 199 310 191   Basic Metabolic Panel: Recent Labs  Lab 12/14/23 0425 12/17/23 0927 12/18/23 0427  NA 136 137 132*  K 4.3 4.2 4.0  CL 101 102 105  CO2 28 25 20*  GLUCOSE 106* 91 95  BUN 8 9 11   CREATININE 0.45* 0.61 0.49*  CALCIUM  9.6 9.1 8.5*  MG  --  2.4  --   PHOS  --  3.5  --    GFR: Estimated Creatinine Clearance: 105.7 mL/min (A) (by C-G formula based on SCr of 0.49 mg/dL (L)). Liver Function Tests: Recent Labs  Lab 12/14/23 0425 12/17/23 0927 12/18/23 0427  AST 12* 465* 12*  ALT 13 189* 11  ALKPHOS 40 42 42  BILITOT 0.5 0.8 1.0  PROT 6.2* 7.1 6.1*  ALBUMIN 3.3* 3.4* 3.3*   Recent Labs  Lab 12/17/23 0927 12/18/23 0427 12/19/23 0439  LIPASE 383* 154* 158*   No results for input(s): "AMMONIA" in the last 168 hours. Coagulation  Profile: Recent Labs  Lab 12/18/23 0427  INR 1.2   Cardiac Enzymes: No results for input(s): "CKTOTAL", "CKMB", "CKMBINDEX", "TROPONINI" in the last 168 hours. BNP (last 3 results) No results for input(s): "PROBNP" in the last 8760 hours. HbA1C: No results for input(s): "HGBA1C" in the last 72 hours. CBG: Recent Labs  Lab 12/18/23 1232  GLUCAP 175*   Lipid Profile: No results for input(s): "CHOL", "HDL", "LDLCALC", "TRIG", "CHOLHDL", "LDLDIRECT" in the last 72 hours. Thyroid  Function Tests: No results for input(s): "TSH", "T4TOTAL", "FREET4", "T3FREE", "THYROIDAB" in the last 72 hours. Anemia Panel: No results for input(s): "VITAMINB12", "FOLATE", "FERRITIN", "TIBC", "IRON", "RETICCTPCT" in the last 72 hours. Sepsis Labs: No results for input(s): "PROCALCITON", "LATICACIDVEN" in the last 168 hours.  Recent Results (from the past 240 hours)  Gastrointestinal Panel by PCR , Stool     Status: None   Collection Time: 12/17/23 10:26 PM   Specimen: Stool  Result Value Ref Range Status   Campylobacter species NOT DETECTED NOT DETECTED Final   Plesimonas shigelloides NOT DETECTED NOT DETECTED Final   Salmonella species NOT DETECTED NOT DETECTED Final   Yersinia enterocolitica NOT DETECTED NOT DETECTED Final  Vibrio species NOT DETECTED NOT DETECTED Final   Vibrio cholerae NOT DETECTED NOT DETECTED Final   Enteroaggregative E coli (EAEC) NOT DETECTED NOT DETECTED Final   Enteropathogenic E coli (EPEC) NOT DETECTED NOT DETECTED Final   Enterotoxigenic E coli (ETEC) NOT DETECTED NOT DETECTED Final   Shiga like toxin producing E coli (STEC) NOT DETECTED NOT DETECTED Final   Shigella/Enteroinvasive E coli (EIEC) NOT DETECTED NOT DETECTED Final   Cryptosporidium NOT DETECTED NOT DETECTED Final   Cyclospora cayetanensis NOT DETECTED NOT DETECTED Final   Entamoeba histolytica NOT DETECTED NOT DETECTED Final   Giardia lamblia NOT DETECTED NOT DETECTED Final   Adenovirus F40/41 NOT  DETECTED NOT DETECTED Final   Astrovirus NOT DETECTED NOT DETECTED Final   Norovirus GI/GII NOT DETECTED NOT DETECTED Final   Rotavirus A NOT DETECTED NOT DETECTED Final   Sapovirus (I, II, IV, and V) NOT DETECTED NOT DETECTED Final    Comment: Performed at [facility], [address] [place], [place] [address]         Radiology Studies: No results found.       Scheduled Meds:  feeding supplement  1 Container Oral TID BM   folic acid   1 mg Oral Daily   LORazepam   0-4 mg Oral Q8H   multivitamin with minerals  1 tablet Oral Daily   pantoprazole   40 mg Oral Daily   PHENobarbital   16.2 mg Oral BID   thiamine   100 mg Oral Daily   Continuous Infusions:  levETIRAcetam  500 mg (12/19/23 2137)     LOS: 3 days    Time spent:    Haydee Lipa, DO [facility]  If 7PM-7AM, please contact night-coverage [email or web address]  12/20/2023, 7:35 AM

## 2023-12-20 NOTE — TOC CM/SW Note (Signed)
 Transition of Care [facility]) - Inpatient Brief Assessment   Patient Details  Name: Gary [name] MRN: [id number] Date of Birth: [birth year]  Transition of Care [facility]) CM/SW Contact:    Gertha Ku, LCSW Phone Number: 12/20/2023, 2:46 PM   Clinical Narrative:  Patient admitted with recent discharge on 2/5, 1/26, 1/22, 1/14, 1/7 and 1/1. Patient with history of homelessness. Resources provided each admit. Resources added to AVS. Pt will be given bus pass, TOC sign off.   Transition of Care Asessment: Insurance and Status: Insurance coverage has been reviewed Patient has primary care physician: No (was porovided list , will not follow up.) Home environment has been reviewed: homeless Prior level of function:: indepednent Prior/Current Home Services: No current home services Social Drivers of Health Review: SDOH reviewed interventions complete Readmission risk has been reviewed: Yes Transition of care needs: no transition of care needs at this time

## 2023-12-20 NOTE — Plan of Care (Signed)

## 2023-12-21 DIAGNOSIS — F10939 Alcohol use, unspecified with withdrawal, unspecified: Secondary | ICD-10-CM | POA: Diagnosis not present

## 2023-12-21 DIAGNOSIS — F1020 Alcohol dependence, uncomplicated: Secondary | ICD-10-CM | POA: Diagnosis not present

## 2023-12-21 DIAGNOSIS — Z5900 Homelessness unspecified: Secondary | ICD-10-CM | POA: Diagnosis not present

## 2023-12-21 DIAGNOSIS — K852 Alcohol induced acute pancreatitis without necrosis or infection: Secondary | ICD-10-CM | POA: Diagnosis not present

## 2023-12-21 DIAGNOSIS — B20 Human immunodeficiency virus [HIV] disease: Secondary | ICD-10-CM | POA: Diagnosis not present

## 2023-12-21 LAB — LIPASE, BLOOD: Lipase: 468 U/L — ABNORMAL HIGH (ref 11–51)

## 2023-12-21 MED ORDER — QUETIAPINE FUMARATE 25 MG PO TABS
25.0000 mg | ORAL_TABLET | Freq: Every day | ORAL | Status: DC | PRN
  Administered 2023-12-21 – 2023-12-22 (×2): 25 mg via ORAL
  Filled 2023-12-21 (×2): qty 1

## 2023-12-21 NOTE — Progress Notes (Signed)
PROGRESS NOTE    Gary [name]  BJY:[id number] DOB: 07/12/1986 DOA: 12/17/2023 PCP: Patient, No Pcp Per   Brief Narrative:  Gary [name] is a 38 y.o. male with medical history significant of alcohol abuse, tobacco abuse, polysubstance abuse, AIDS not on treatment due to noncompliance,history of alcohol withdrawal and non-withdrawal seizures, bipolar 2 disorder, PTSD, schizophrenia, alcohol induced pancreatitis(chronic) who was recently admitted and discharged from 12/10/2023 until 12/15/2023 due to chronic alcoholic pancreatitis exacerbation and alcohol abuse. He was initially tolerating PO well but had increased pain - noted to have started back drinking alcohol (3 bottles of vodka and 2 bottles of wine per report). Hospitalist called for admission.  Assessment & Plan:   Principal Problem:   Acute alcoholic pancreatitis Active Problems:   Homelessness   Alcohol use disorder, severe, dependence (HCC)   HIV (human immunodeficiency virus infection) (HCC)   Nicotine dependence, cigarettes, uncomplicated   Seizure disorder (HCC)   Alcoholic fatty liver   Anxiety state  Recurrent hospitalizations, concern for self-harm to gain access to controlled substances History of major depressive disorder, rule out undiagnosed anxiety -Multiple hospitalizations despite education that ongoing alcohol use will cause worsening pancreatitis and pain. -Concern patient is intentionally harming himself to obtain controlled substances at our facility -Denies previous psychiatric follow-up -Patient lashing out 12/19/2023 threatening staff if he does not receive IV narcotics and IV benzos despite having p.o. medications available and is tolerating a diet without difficulty. -Appreciate psychiatric help with this difficult situation patient initially not agreeable for medical treatment but is now agreeable to start anti-anxiety medications now (discussed with Psych)  Acute recurrent alcoholic  pancreatitis Secondary to noncompliance and alcohol abuse  DC IV fluids, advance diet as tolerated -Supportive care ongoing, hold further narcotics given history of polysubstance abuse and recurrent hospitalizations after profound non-compliance(concerning for self harm to obtain narcotics although he denies this). -Lipase initially downtrending appropriately but lipase elevated overnight after advancement of diet - placed back on liquid diet   Alcohol use disorder, severe, dependence CIWA protocol with lorazepam and phenobarbital taper. Folate, MVI and thiamine supplementation ongoing Patient indicates he will need lifelong benzos/pain medicine to keep him from having withdrawals - educated that withdrawals are short-lived and once he is detoxed he would not need chronic medications.   Seizure disorder (HCC) PO keppra ongoing Phenobarbital added to withdrawal protocol.   Transaminitis with hepatic steatosis Secondary to alcohol use and abuse, downtrending appropriately   AIDS, poorly controlled Due to noncompliance will not resume antiretrovirals - have patient follow up with ID for further discussion   Tobacco abuse nicotine dependence, cigarettes, uncomplicated Tobacco cessation advised. Nicotine replacement therapy ordered.  Known history of substance abuse Seeking behavior Avoid narcotics unless absolutely necessary as above  DVT prophylaxis: SCDs Start: 12/17/23 1503 Code Status:   Code Status: Full Code Family Communication: None present  Status is: Inpatient  Dispo: The patient is from: Home              Anticipated d/c is to: Same              Anticipated d/c date is: 48 to 72 hours              Patient currently not medically stable for discharge  Consultants:  None  Procedures:  None  Antimicrobials:  None  Subjective: No acute issues or events noted overnight, labs today show elevated lipase, patient's symptoms appear to be stable at this time, patient  now requesting further  medical treatment for his anxiety, willing to speak to psych again about initiating medication during hospitalization.  He otherwise denies nausea vomiting diarrhea constipation headache fevers chills or chest pain.  Objective: Vitals:   12/19/23 2339 12/20/23 0641 12/20/23 1937 12/21/23 0433  BP:  104/77 109/64 (!) 94/51  Pulse: (!) 52 62 71 68  Resp:  16 20 20   Temp:  98.7 F (37.1 C) 98.5 F (36.9 C) 98.7 F (37.1 C)  TempSrc:  Oral Oral Oral  SpO2:  100% 100% 100%  Weight:      Height:        Intake/Output Summary (Last 24 hours) at 12/21/2023 0725 Last data filed at 12/21/2023 0538 Gross per 24 hour  Intake 120 ml  Output 1300 ml  Net -1180 ml    Filed Weights   12/17/23 0817 12/17/23 1733  Weight: 54 kg 59.1 kg    Examination:  General:  Pleasantly resting in bed, No acute distress. HEENT:  Normocephalic atraumatic.  Sclerae nonicteric, noninjected.  Extraocular movements intact bilaterally. Neck:  Without mass or deformity.  Trachea is midline. Lungs:  Clear to auscultate bilaterally without rhonchi, wheeze, or rales. Heart:  Regular rate and rhythm.  Without murmurs, rubs, or gallops. Abdomen:  Soft, minimally tender, nondistended.  Without guarding or rebound. Extremities: Without cyanosis, clubbing, edema, or obvious deformity. Skin:  Warm and dry, no erythema.  Data Reviewed: I have personally reviewed following labs and imaging studies  CBC: Recent Labs  Lab 12/17/23 0927 12/18/23 0427  WBC 5.2 3.4*  NEUTROABS 3.3  --   HGB 12.2* 9.7*  HCT 37.0* 29.6*  MCV 91.8 94.3  PLT 310 191   Basic Metabolic Panel: Recent Labs  Lab 12/17/23 0927 12/18/23 0427  NA 137 132*  K 4.2 4.0  CL 102 105  CO2 25 20*  GLUCOSE 91 95  BUN 9 11  CREATININE 0.61 0.49*  CALCIUM 9.1 8.5*  MG 2.4  --   PHOS 3.5  --    GFR: Estimated Creatinine Clearance: 105.7 mL/min (A) (by C-G formula based on SCr of 0.49 mg/dL (L)). Liver Function  Tests: Recent Labs  Lab 12/17/23 0927 12/18/23 0427  AST 465* 12*  ALT 189* 11  ALKPHOS 42 42  BILITOT 0.8 1.0  PROT 7.1 6.1*  ALBUMIN 3.4* 3.3*   Recent Labs  Lab 12/17/23 0927 12/18/23 0427 12/19/23 0439 12/20/23 0916 12/21/23 0416  LIPASE 383* 154* 158* 140* 468*   Coagulation Profile: Recent Labs  Lab 12/18/23 0427  INR 1.2   CBG: Recent Labs  Lab 12/18/23 1232  GLUCAP 175*    Recent Results (from the past 240 hours)  Gastrointestinal Panel by PCR , Stool     Status: None   Collection Time: 12/17/23 10:26 PM   Specimen: Stool  Result Value Ref Range Status   Campylobacter species NOT DETECTED NOT DETECTED Final   Plesimonas shigelloides NOT DETECTED NOT DETECTED Final   Salmonella species NOT DETECTED NOT DETECTED Final   Yersinia enterocolitica NOT DETECTED NOT DETECTED Final   Vibrio species NOT DETECTED NOT DETECTED Final   Vibrio cholerae NOT DETECTED NOT DETECTED Final   Enteroaggregative E coli (EAEC) NOT DETECTED NOT DETECTED Final   Enteropathogenic E coli (EPEC) NOT DETECTED NOT DETECTED Final   Enterotoxigenic E coli (ETEC) NOT DETECTED NOT DETECTED Final   Shiga like toxin producing E coli (STEC) NOT DETECTED NOT DETECTED Final   Shigella/Enteroinvasive E coli (EIEC) NOT DETECTED NOT DETECTED Final  Cryptosporidium NOT DETECTED NOT DETECTED Final   Cyclospora cayetanensis NOT DETECTED NOT DETECTED Final   Entamoeba histolytica NOT DETECTED NOT DETECTED Final   Giardia lamblia NOT DETECTED NOT DETECTED Final   Adenovirus F40/41 NOT DETECTED NOT DETECTED Final   Astrovirus NOT DETECTED NOT DETECTED Final   Norovirus GI/GII NOT DETECTED NOT DETECTED Final   Rotavirus A NOT DETECTED NOT DETECTED Final   Sapovirus (I, II, IV, and V) NOT DETECTED NOT DETECTED Final    Comment: Performed at [facility], [address], [place], [place] [address]    Radiology Studies: No results found.  Scheduled Meds:  feeding supplement  1  Container Oral TID BM   folic acid  1 mg Oral Daily   levETIRAcetam  500 mg Oral BID   LORazepam  0-4 mg Oral Q8H   multivitamin with minerals  1 tablet Oral Daily   pantoprazole  40 mg Oral Daily   PHENobarbital  16.2 mg Oral BID   thiamine  100 mg Oral Daily   Continuous Infusions:   LOS: 4 days   Time spent:  Azucena Fallen, DO [facility]  If 7PM-7AM, please contact night-coverage [email or web address]  12/21/2023, 7:25 AM

## 2023-12-21 NOTE — Consult Note (Cosign Needed Addendum)
[facility] Psychiatric Consult Initial  Patient Name: .Gary [name]  MRN: [id number]  DOB: Aug 16, 1986  Consult Order details:  Orders (From admission, onward)     Start     Ordered   12/20/23 0736  IP CONSULT TO PSYCHIATRY       Comments: Recurrent admission with ?intentional self harm consuming alcohol with known pancreatitis in hopes to obtain narcotics. Desperately needs outpatient follow up  Ordering Provider: Azucena Fallen, MD  Provider:  (Not yet assigned)  Question Answer Comment  Location [facility]   Reason for Consult? Abnormal/aggressive behavoir      12/20/23 0736             Mode of Visit: In person    Psychiatry Consult Evaluation  Service Date: December 21, 2023 LOS:  LOS: 4 days  Chief Complaint "My stomach hurts like hell."  Primary Psychiatric Diagnoses  Alcohol withdrawal with complications 2.  Polysubstance use disorder, including opiates 3.  Anxiety  Assessment  Gary [name] is a 38 y.o. male admitted: Medicallyfor 12/17/2023  8:08 AM for abdominal pain and nausea and vomiting. He carries the psychiatric diagnoses of polysubstance dependence including opiates and has a past medical history of pancreatitis, HIV (noncompliant with meds), pancytopenia, constipation, seizure d/o  His current presentation of irritability and anxiety is most consistent with alcohol withdrawal symptoms. Current outpatient psychotropic medications include none. On initial examination, patient was irritable and anxious. Please see plan below for detailed recommendations.   Diagnoses:  Active Hospital problems: Principal Problem:   Acute alcoholic pancreatitis Active Problems:   Alcohol use disorder, severe, dependence (HCC)   HIV (human immunodeficiency virus infection) (HCC)   Nicotine dependence, cigarettes, uncomplicated   Homelessness   Seizure disorder (HCC)   Alcoholic fatty liver   Anxiety state    Plan   ## Psychiatric  Medication Recommendations:  Started Seroquel 25 mg daily PRN with re-evaluation tomorrow  ## Medical Decision Making Capacity: Not specifically addressed in this encounter  ## Further Work-up:  -- none -- most recent EKG on 12/17/23 had QtC of 472 -- Pertinent labwork reviewed earlier this admission includes: CMP, HIV labs, GI panel, PT, glucose, EKG, CBC, hepatitis panels   ## Disposition:-- There are no psychiatric contraindications to discharge at this time  ## Behavioral / Environmental: -Utilize compassion and acknowledge the patient's experiences while setting clear and realistic expectations for care.    ## Safety and Observation Level:  - Based on my clinical evaluation, I estimate the patient to be at low risk of self harm in the current setting. - At this time, we recommend  routine. This decision is based on my review of the chart including patient's history and current presentation, interview of the patient, mental status examination, and consideration of suicide risk including evaluating suicidal ideation, plan, intent, suicidal or self-harm behaviors, risk factors, and protective factors. This judgment is based on our ability to directly address suicide risk, implement suicide prevention strategies, and develop a safety plan while the patient is in the clinical setting. Please contact our team if there is a concern that risk level has changed.  CSSR Risk Category:C-SSRS RISK CATEGORY: Error: Q3, 4, or 5 should not be populated when Q2 is No  Suicide Risk Assessment: Patient has following modifiable risk factors for suicide is low: no past suicide attempts, denies depression, no psychiatric admissions which we are addressing with the Ativan/phenobarbital detox protocol. Patient has following non-modifiable or demographic risk factors for  suicide: male gender Patient has the following protective factors against suicide: no history of suicide attempts and no history of  NSSIB  Thank you for this consult request. Recommendations have been communicated to the primary team.  We will sign off at this time.   Nanine Means, NP       History of Present Illness  Relevant Aspects of [facility] Course:  Admitted on 12/17/2023 for abdominal pain and alcohol withdrawal. They are being treated for both.  Multiple hospitalizations per the chart for similar presentations.   Patient Report:  "My stomach hurts like hell" were the first words used upon entering his room.  When this provider introduced herself, he responded with, "I don't need psych.  I don't need psych meds".  He was irritable and denied depression and suicidal ideations, no hallucinations.  His anxiety was high along with cravings for alcohol and sweats with tremors that he described when discussing withdrawal symptoms.  "I'm always like this.  I had these symptoms (sweating and tremors) in prison for a month."  His appetite is decreased related to his abdominal pains, sleep was fair.  He was upset that he is not receiving narcotics and threatening to sue everyone.  When explored medications to assist with his anxiety, he stated, "I need alcohol or benzodiazepines, that's the only thing that helps".  He would not entertain another option despite explaining that benzodiazepines operate in the brain the same way alcohol does and they would not be prescribed outside of his detox taper, which he voiced "Thanks for nothing".  He did stated he was drinking two fifths of vodka daily prior to admission along with some meth and cocaine use.  Discussed rehab which he was only interested in if he got a prescription for benzodiazepines, this provider explained this would not be ordered and was exclusionary criteria for rehabs.  The client continued to push for benzodiazepines throughout the assessment, politely declined his requests.    12/21/23: The MD contacted psych to let them know that he is willing to take anything for  his anxiety.  On assessment, he was pleasantly eating his dinner while expressing the desire to go to rehab, [facility] consult placed for him.  He continues to have high anxiety and low depression.  Discussed Seroquel as an option which he liked as he heard that it does help with anxiety.  Consulted with Dr. Gasper Sells who is in agreement to start the medications and this provider will follow up tomorrow with the client.  Psych ROS:  Depression: denied Anxiety:  high Mania (lifetime and current): denied Psychosis: (lifetime and current): psychosis when using meth at times  Review of Systems  Constitutional:  Positive for malaise/fatigue.  HENT: Negative.    Eyes: Negative.   Respiratory: Negative.    Cardiovascular: Negative.   Gastrointestinal: Negative.   Genitourinary: Negative.   Musculoskeletal: Negative.   Skin: Negative.   Neurological: Negative.   Endo/Heme/Allergies: Negative.   Psychiatric/Behavioral:  Positive for substance abuse. The patient is nervous/anxious.      Psychiatric and Social History  Psychiatric History:  Information collected from chart and patient.  Prev Dx/Sx: polysubstance dependency, alcohol dependency with complicated withdrawal Current Psych Provider: none Home Meds (current): none, noncompliant with medical medications, including his HIV ones Previous Med Trials: gabapentin, propranolol, hydroxyzine Therapy: none currently  Prior Psych Hospitalization: denied  Prior Self Harm: denied Prior Violence: denied  Family Psych History: substance abuse Family Hx suicide: none  Social History:  Armed forces operational officer  Hx: past issues with prison time Living Situation: homeless shelter  Access to weapons/lethal means: denies   Substance History Alcohol: 2 1/5s of vodka daily  Type of alcohol vodka Last Drink the day before admission Number of drinks per day 1, 1/5s of vodka History of alcohol withdrawal seizures yes History of DT's yes Tobacco: yes Illicit drugs:  meth, cocaine Prescription drug abuse: if available Rehab hx: [facility]  Exam Findings   Vital Signs:  Temp:  [98 F (36.7 C)-98.7 F (37.1 C)] 98 F (36.7 C) (02/11 1223) Pulse Rate:  [61-71] 61 (02/11 1223) Resp:  [16-20] 16 (02/11 1223) BP: (94-109)/(51-65) 98/65 (02/11 1223) SpO2:  [100 %] 100 % (02/11 1223) Blood pressure 98/65, pulse 61, temperature 98 F (36.7 C), temperature source Oral, resp. rate 16, height 6' (1.829 m), weight 59.1 kg, SpO2 100%. Body mass index is 17.67 kg/m.  Physical Exam Vitals and nursing note reviewed.  HENT:     Head: Normocephalic.  Pulmonary:     Effort: Pulmonary effort is normal.  Neurological:     General: No focal deficit present.     Mental Status: He is alert and oriented to person, place, and time.     Mental Status Exam: General Appearance: emaciated   Orientation:  Full (Time, Place, and Person)  Memory:  Immediate;   Good Recent;   Good Remote;   Good  Concentration:  Concentration: Fair and Attention Span: Fair  Recall:  Good  Attention  Fair  Eye Contact:  Good  Speech:  Normal Rate  Language:  Good  Volume:  Normal  Mood: anxious, low depression  Affect:  Congruent  Thought Process:  Coherent  Thought Content:  Logical  Suicidal Thoughts:  No  Homicidal Thoughts:  No  Judgement:  Fair  Insight:  Lacking  Psychomotor Activity:  Decreased  Akathisia:  No  Fund of Knowledge:  Fair      Assets:  Resilience  Cognition:  WNL  ADL's:  Intact  AIMS (if indicated):        Other History   These have been pulled in through the EMR, reviewed, and updated if appropriate.  Family History:  The patient's family history includes Alcohol abuse in his father and mother; Cancer in an other family member; Colon cancer in an other family member; Other in an other family member.  Medical History: Past Medical History:  Diagnosis Date   Alcohol abuse    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   Anxiety    Bipolar 2  disorder (HCC)    HIV (human immunodeficiency virus infection) (HCC)    Pancreatitis    PTSD (post-traumatic stress disorder)    Schizophrenia (HCC)    Seizures (HCC)    Subdural hematoma (HCC)     Surgical History: Past Surgical History:  Procedure Laterality Date   BIOPSY  04/19/2022   Procedure: BIOPSY;  Surgeon: Lemar Lofty., MD;  Location: [facility] ENDOSCOPY;  Service: Gastroenterology;;   ENTEROSCOPY N/A 04/19/2022   Procedure: ENTEROSCOPY;  Surgeon: Lemar Lofty., MD;  Location: [facility] ENDOSCOPY;  Service: Gastroenterology;  Laterality: N/A;   INCISION AND DRAINAGE PERIRECTAL ABSCESS N/A 09/24/2016   Procedure: IRRIGATION AND DEBRIDEMENT PERIRECTAL ABSCESS;  Surgeon: Ricarda Frame, MD;  Location: [facility] ORS;  Service: General;  Laterality: N/A;   none       Medications:   Current Facility-Administered Medications:    acetaminophen (TYLENOL) tablet 650 mg, 650 mg, Oral, Q6H PRN, 650 mg at 12/18/23 1019 **OR** acetaminophen (TYLENOL) suppository  650 mg, 650 mg, Rectal, Q6H PRN, Bobette Mo, MD   feeding supplement (BOOST / RESOURCE BREEZE) liquid 1 Container, 1 Container, Oral, TID BM, Bobette Mo, MD, 1 Container at 12/20/23 1640   folic acid (FOLVITE) tablet 1 mg, 1 mg, Oral, Daily, Bobette Mo, MD, 1 mg at 12/21/23 1036   levETIRAcetam (KEPPRA) tablet 500 mg, 500 mg, Oral, BID, Azucena Fallen, MD, 500 mg at 12/21/23 1036   multivitamin with minerals tablet 1 tablet, 1 tablet, Oral, Daily, Bobette Mo, MD, 1 tablet at 12/21/23 1036   ondansetron [facility]) tablet 4 mg, 4 mg, Oral, Q6H PRN, 4 mg at 12/21/23 1036 **OR** ondansetron (ZOFRAN) injection 4 mg, 4 mg, Intravenous, Q6H PRN, Bobette Mo, MD, 4 mg at 12/18/23 2235   pantoprazole (PROTONIX) EC tablet 40 mg, 40 mg, Oral, Daily, Norva Pavlov, RPH, 40 mg at 12/21/23 1036   PHENobarbital (LUMINAL) tablet 16.2 mg, 16.2 mg, Oral, BID, Wofford, Drew A, RPH, 16.2 mg at  12/21/23 1036   thiamine (VITAMIN B1) tablet 100 mg, 100 mg, Oral, Daily, 100 mg at 12/21/23 1036 **OR** [DISCONTINUED] thiamine (VITAMIN B1) injection 100 mg, 100 mg, Intravenous, Daily, Bobette Mo, MD, 100 mg at 12/17/23 1540  Allergies: Allergies  Allergen Reactions   Tegretol [Carbamazepine] Other (See Comments)    Vertigo and paralysis   Caffeine Palpitations    Nanine Means, NP

## 2023-12-22 DIAGNOSIS — Z5900 Homelessness unspecified: Secondary | ICD-10-CM | POA: Diagnosis not present

## 2023-12-22 DIAGNOSIS — F10939 Alcohol use, unspecified with withdrawal, unspecified: Secondary | ICD-10-CM | POA: Diagnosis not present

## 2023-12-22 DIAGNOSIS — F1020 Alcohol dependence, uncomplicated: Secondary | ICD-10-CM | POA: Diagnosis not present

## 2023-12-22 DIAGNOSIS — K852 Alcohol induced acute pancreatitis without necrosis or infection: Secondary | ICD-10-CM | POA: Diagnosis not present

## 2023-12-22 DIAGNOSIS — B20 Human immunodeficiency virus [HIV] disease: Secondary | ICD-10-CM | POA: Diagnosis not present

## 2023-12-22 LAB — LIPASE, BLOOD: Lipase: 283 U/L — ABNORMAL HIGH (ref 11–51)

## 2023-12-22 MED ORDER — QUETIAPINE FUMARATE 50 MG PO TABS
50.0000 mg | ORAL_TABLET | Freq: Two times a day (BID) | ORAL | Status: DC
  Administered 2023-12-23 – 2023-12-27 (×9): 50 mg via ORAL
  Filled 2023-12-22 (×9): qty 1

## 2023-12-22 MED ORDER — QUETIAPINE FUMARATE 50 MG PO TABS
50.0000 mg | ORAL_TABLET | Freq: Every day | ORAL | Status: AC
  Administered 2023-12-22: 50 mg via ORAL
  Filled 2023-12-22: qty 1

## 2023-12-22 MED ORDER — SENNOSIDES-DOCUSATE SODIUM 8.6-50 MG PO TABS
1.0000 | ORAL_TABLET | Freq: Two times a day (BID) | ORAL | Status: DC | PRN
  Administered 2023-12-22: 1 via ORAL
  Filled 2023-12-22: qty 1

## 2023-12-22 MED ORDER — BISACODYL 10 MG RE SUPP
10.0000 mg | Freq: Every day | RECTAL | Status: DC | PRN
  Administered 2023-12-24: 10 mg via RECTAL
  Filled 2023-12-22 (×2): qty 1

## 2023-12-22 MED ORDER — PROCHLORPERAZINE EDISYLATE 10 MG/2ML IJ SOLN: 5.0000 mg | Freq: Four times a day (QID) | INTRAMUSCULAR | Status: DC | PRN

## 2023-12-22 MED ORDER — QUETIAPINE FUMARATE 50 MG PO TABS: 50.0000 mg | ORAL_TABLET | Freq: Every day | ORAL | Status: DC

## 2023-12-22 MED ORDER — POLYETHYLENE GLYCOL 3350 17 G PO PACK
17.0000 g | PACK | Freq: Every day | ORAL | Status: DC
  Administered 2023-12-22 – 2023-12-27 (×4): 17 g via ORAL
  Filled 2023-12-22 (×6): qty 1

## 2023-12-22 MED ORDER — QUETIAPINE FUMARATE 50 MG PO TABS: 50.0000 mg | ORAL_TABLET | Freq: Two times a day (BID) | ORAL | Status: DC

## 2023-12-22 NOTE — TOC Initial Note (Signed)
Transition of Care [name]) - Initial/Assessment Note    Patient Details  Name: Gary [name] MRN: [id number] Date of Birth: [birth year]  [name] CM/SW Contact:    Gary Kass, LCSW Phone Number: 12/22/2023, 9:06 AM  Clinical Narrative:                 CSW received a consult for substance use rehab placement. The CSW discussed placement with the pt. Pt reported he would like assistance with getting placed. He stated that he doesn't want a program that is too long. CSW mentioned [facility], and the pt stated he would like to try there and gave permission to send the referral. TOC to follow.  Expected Discharge Plan:  (substance use rehab) Barriers to Discharge: Continued Medical Work up   Patient Goals and CMS Choice Patient states their goals for this hospitalization and ongoing recovery are:: substance use rehab          Expected Discharge Plan and Services                                              Prior Living Arrangements/Services     Patient language and need for interpreter reviewed:: Yes        Need for Family Participation in Patient Care: No (Comment) Care giver support system in place?: No (comment)   Criminal Activity/Legal Involvement Pertinent to Current Situation/Hospitalization: No - Comment as needed  Activities of Daily Living   ADL Screening (condition at time of admission) Independently performs ADLs?: Yes (appropriate for developmental age) Is the patient deaf or have difficulty hearing?: No Does the patient have difficulty seeing, even when wearing glasses/contacts?: No Does the patient have difficulty concentrating, remembering, or making decisions?: No  Permission Sought/Granted                  Emotional Assessment Appearance:: Appears older than stated age Attitude/Demeanor/Rapport: Gracious Affect (typically observed): Accepting Orientation: : Oriented to Self, Oriented to Place, Oriented to  Situation      Admission diagnosis:  Acute pancreatitis [K85.90] Acute pancreatitis, unspecified complication status, unspecified pancreatitis type [K85.90] Patient Active Problem List   Diagnosis Date Noted   Anxiety state 12/20/2023   Acute pancreatitis 12/17/2023   Acute alcoholic pancreatitis 12/10/2023   Acute on chronic anemia 12/05/2023   Alcohol withdrawal (HCC) 11/26/2023   Seizure (HCC) 11/19/2023   Gastritis and duodenitis 11/19/2023   Chronic pancreatitis (HCC) 10/16/2023   Alcoholic fatty liver 09/25/2023   Seizure disorder (HCC) 12/17/2022   Pancytopenia (HCC) 12/14/2022   Alcohol-induced pancreatitis 12/14/2022   Delirium tremens (HCC) 10/19/2022   Alcohol dependence with withdrawal (HCC) 10/16/2022   Homelessness 05/28/2022   Constipation 07/30/2021   Malingering 05/15/2020   Alcohol use disorder, severe, dependence (HCC) 04/16/2019   HIV (human immunodeficiency virus infection) (HCC) 06/16/2018   Polysubstance abuse (HCC) 06/16/2018   Nicotine dependence, cigarettes, uncomplicated 06/16/2018   PCP:  Patient, No Pcp Per Pharmacy:   [name] - [facility] [address] [place] [address] Phone: [phone] Fax: [phone]  [facility] [phone] - [place] [name], [place] - [address] AT [facility] [address] [place] [address] Phone: [phone] Fax: [phone]  [id number] - [place], [place] - [address] AT [facility] OF [address]  [name] [address] Phone: [phone] Fax: [phone]     Social Drivers of Health (SDOH) Social History: SDOH Screenings   Food Insecurity: Food Insecurity Present (12/17/2023)  Housing: High Risk (12/17/2023)  Transportation Needs: Unmet Transportation Needs (12/17/2023)  Utilities: Not At Risk (12/17/2023)  Recent Concern: Utilities - At Risk (11/11/2023)  Alcohol Screen: High Risk (07/25/2021)  Depression  (PHQ2-9): Medium Risk (02/12/2021)  Financial Resource Strain: Patient Declined (10/11/2023)  Social Connections: Socially Isolated (12/17/2023)  Stress: No Stress Concern Present (06/30/2022)   Received from [place] [name], [facility]  Recent Concern: Stress - Stress Concern Present (06/25/2022)   Received from [facility]  Tobacco Use: High Risk (12/17/2023)   SDOH Interventions:     Readmission Risk Interventions    12/15/2023   11:23 AM 11/29/2023   10:27 AM 11/22/2023   10:36 AM  Readmission Risk Prevention Plan  Transportation Screening Complete Complete Complete  Medication Review Oceanographer) Complete Complete Complete  PCP or Specialist appointment within 3-5 days of discharge Complete  Complete  HRI or Home Care Consult Complete Complete Complete  SW Recovery Care/Counseling Consult Complete Complete Complete  Palliative Care Screening Not Applicable Not Applicable Not Applicable  Skilled Nursing Facility Not Applicable Not Applicable Not Applicable

## 2023-12-22 NOTE — Hospital Course (Addendum)
Gary [name] is a 38 y.o. male with medical history significant of alcohol abuse, tobacco abuse, polysubstance abuse, AIDS not on treatment due to noncompliance,history of alcohol withdrawal and non-withdrawal seizures, bipolar 2 disorder, PTSD, schizophrenia, alcohol induced pancreatitis(chronic) who was recently admitted and discharged from 12/10/2023 until 12/15/2023 due to chronic alcoholic pancreatitis exacerbation and alcohol abuse.  See below for further plan.

## 2023-12-22 NOTE — Progress Notes (Signed)
Patient is complaining of abdominal pain on upper area 10/10 pain scale and had 1 episode of vomiting. Patient was given tylenol for pain but says tylenol did not help with his pain asking if he could get something other than tylenol. Liana Crocker NP was informed of this and will not order anything per MD notes. Explained this to patient and still asking for pain medicine. Offered warm/cold compress but patient refused.

## 2023-12-22 NOTE — Progress Notes (Signed)
Progress Note    Gary [name]   ZOX:[id number]  DOB: 1986/05/17  DOA: 12/17/2023     5 PCP: Patient, No Pcp Per  Initial CC: abdominal pain  Hospital Course: Gary [name] is a 38 y.o. male with medical history significant of alcohol abuse, tobacco abuse, polysubstance abuse, AIDS not on treatment due to noncompliance,history of alcohol withdrawal and non-withdrawal seizures, bipolar 2 disorder, PTSD, schizophrenia, alcohol induced pancreatitis(chronic) who was recently admitted and discharged from 12/10/2023 until 12/15/2023 due to chronic alcoholic pancreatitis exacerbation and alcohol abuse.   Interval History:  No events overnight. Resting comfortably this morning. Tolerated FLD. Amenable to advance diet today.  He was calm, pleasant, and cooperative.   Assessment and Plan:  Recurrent hospitalizations, concern for self-harm to gain access to controlled substances History of major depressive disorder, rule out undiagnosed anxiety -Multiple hospitalizations despite education that ongoing alcohol use will cause worsening pancreatitis and pain. -Concern patient is intentionally harming himself to obtain controlled substances at our facility -Denies previous psychiatric follow-up -Patient lashing out 12/19/2023 threatening staff if he does not receive IV narcotics and IV benzos despite having p.o. medications available and is tolerating a diet without difficulty. -Appreciate psychiatric help with this difficult situation patient initially not agreeable for medical treatment but is now agreeable to start anti-anxiety medications now (discussed with Psych) - has been started on Seroquel    Acute recurrent alcoholic pancreatitis Secondary to noncompliance and alcohol abuse  DC IV fluids, advance diet as tolerated -Supportive care ongoing, hold further narcotics given history of polysubstance abuse and recurrent hospitalizations after profound non-compliance(concerning for self harm to  obtain narcotics although he denies this). -Lipase now downtrending again - advance diet today, 2/12   Alcohol use disorder, severe, dependence CIWA protocol with lorazepam and phenobarbital taper. Folate, MVI and thiamine supplementation ongoing Patient indicates he will need lifelong benzos/pain medicine to keep him from having withdrawals - educated that withdrawals are short-lived and once he is detoxed he would not need chronic medications.   Seizure disorder (HCC) PO keppra ongoing Phenobarbital added to withdrawal protocol.   Transaminitis with hepatic steatosis Secondary to alcohol use and abuse, downtrending appropriately   AIDS, poorly controlled Due to noncompliance will not resume antiretrovirals - have patient follow up with ID for further discussion   Tobacco abuse nicotine dependence, cigarettes, uncomplicated Tobacco cessation advised. Nicotine replacement therapy ordered.   Known history of substance abuse Seeking behavior Avoid narcotics unless absolutely necessary as above   Old records reviewed in assessment of this patient  Antimicrobials:   DVT prophylaxis:  SCDs Start: 12/17/23 1503   Code Status:   Code Status: Full Code  Mobility Assessment (Last 72 Hours)     Mobility Assessment     Row Name 12/22/23 1100 12/21/23 2331 12/21/23 2329 12/21/23 1000 12/20/23 2332   Does patient have an order for bedrest or is patient medically unstable Yes- Bedfast (Level 1) - Complete No - Continue assessment No - Continue assessment Yes- Bedfast (Level 1) - Complete No - Continue assessment   What is the highest level of mobility based on the progressive mobility assessment? Level 6 (Walks independently in room and hall) - Balance while walking in room without assist - Complete Level 6 (Walks independently in room and hall) - Balance while walking in room without assist - Complete Level 6 (Walks independently in room and hall) - Balance while walking in room  without assist - Complete Level 6 (Walks independently in room and  hall) - Balance while walking in room without assist - Complete Level 6 (Walks independently in room and hall) - Balance while walking in room without assist - Complete   Is the above level different from baseline mobility prior to current illness? No - Consider discontinuing PT/OT No - Consider discontinuing PT/OT No - Consider discontinuing PT/OT No - Consider discontinuing PT/OT No - Consider discontinuing PT/OT    Row Name 12/20/23 1100 12/19/23 2229         Does patient have an order for bedrest or is patient medically unstable No - Continue assessment No - Continue assessment      What is the highest level of mobility based on the progressive mobility assessment? Level 6 (Walks independently in room and hall) - Balance while walking in room without assist - Complete Level 6 (Walks independently in room and hall) - Balance while walking in room without assist - Complete      Is the above level different from baseline mobility prior to current illness? No - Consider discontinuing PT/OT No - Consider discontinuing PT/OT               Barriers to discharge: none Disposition Plan:  Rehab? Status is: Inpt  Objective: Blood pressure 94/62, pulse (!) 53, temperature 97.9 F (36.6 C), temperature source Oral, resp. rate 14, height 6' (1.829 m), weight 59.1 kg, SpO2 99%.  Examination:  Physical Exam Constitutional:      General: He is not in acute distress.    Appearance: Normal appearance.  HENT:     Head: Normocephalic and atraumatic.     Mouth/Throat:     Mouth: Mucous membranes are moist.  Eyes:     Extraocular Movements: Extraocular movements intact.  Cardiovascular:     Rate and Rhythm: Normal rate and regular rhythm.  Pulmonary:     Effort: Pulmonary effort is normal. No respiratory distress.     Breath sounds: Normal breath sounds. No wheezing.  Abdominal:     General: Bowel sounds are normal. There is no  distension.     Palpations: Abdomen is soft.     Tenderness: There is abdominal tenderness (generalized).  Musculoskeletal:        General: Normal range of motion.     Cervical back: Normal range of motion and neck supple.  Skin:    General: Skin is warm and dry.  Neurological:     General: No focal deficit present.     Mental Status: He is alert.  Psychiatric:        Mood and Affect: Mood normal.        Behavior: Behavior normal.      Consultants:  Psychiatry   Procedures:    Data Reviewed: Results for orders placed or performed during the hospital encounter of 12/17/23 (from the past 24 hours)  Lipase, blood     Status: Abnormal   Collection Time: 12/22/23  4:16 AM  Result Value Ref Range   Lipase 283 (H) 11 - 51 U/L    I have reviewed pertinent nursing notes, vitals, labs, and images as necessary. I have ordered labwork to follow up on as indicated.  I have reviewed the last notes from staff over past 24 hours. I have discussed patient's care plan and test results with nursing staff, CM/SW, and other staff as appropriate.  Time spent: Greater than 50% of the 55 minute visit was spent in counseling/coordination of care for the patient as laid out in the A&P.  LOS: 5 days   Lewie Chamber, MD [facility] 12/22/2023, 1:28 PM

## 2023-12-22 NOTE — Consult Note (Signed)
[facility] Psychiatric Consult Initial  Patient Name: .Gary [name]  MRN: [id number]  DOB: 12-May-1986  Consult Order details:  Orders (From admission, onward)     Start     Ordered   12/20/23 0736  IP CONSULT TO PSYCHIATRY       Comments: Recurrent admission with ?intentional self harm consuming alcohol with known pancreatitis in hopes to obtain narcotics. Desperately needs outpatient follow up  Ordering Provider: Azucena Fallen, MD  Provider:  (Not yet assigned)  Question Answer Comment  Location [facility]   Reason for Consult? Abnormal/aggressive behavoir      12/20/23 0736             Mode of Visit: In person    Psychiatry Consult Evaluation  Service Date: December 22, 2023 LOS:  LOS: 5 days  Chief Complaint "My stomach hurts like hell."  Primary Psychiatric Diagnoses  Alcohol withdrawal with complications 2.  Polysubstance use disorder, including opiates 3.  Anxiety  Assessment  Gary [name] is a 38 y.o. male admitted: Medicallyfor 12/17/2023  8:08 AM for abdominal pain and nausea and vomiting. He carries the psychiatric diagnoses of polysubstance dependence including opiates and has a past medical history of pancreatitis, HIV (noncompliant with meds), pancytopenia, constipation, seizure d/o  His current presentation of irritability and anxiety is most consistent with alcohol withdrawal symptoms. Current outpatient psychotropic medications include none. On initial examination, patient was irritable and anxious. Please see plan below for detailed recommendations.   Diagnoses:  Active Hospital problems: Principal Problem:   Acute alcoholic pancreatitis Active Problems:   Alcohol use disorder, severe, dependence (HCC)   HIV (human immunodeficiency virus infection) (HCC)   Nicotine dependence, cigarettes, uncomplicated   Homelessness   Seizure disorder (HCC)   Alcoholic fatty liver   Anxiety state    Plan   ## Psychiatric  Medication Recommendations:  Increased Seroquel to 50 mg BID  ## Medical Decision Making Capacity: Not specifically addressed in this encounter  ## Further Work-up:  -- none -- most recent EKG on 12/17/23 had QtC of 472 -- Pertinent labwork reviewed earlier this admission includes: CMP, HIV labs, GI panel, PT, glucose, EKG, CBC, hepatitis panels   ## Disposition:-- There are no psychiatric contraindications to discharge at this time  ## Behavioral / Environmental: -Utilize compassion and acknowledge the patient's experiences while setting clear and realistic expectations for care.    ## Safety and Observation Level:  - Based on my clinical evaluation, I estimate the patient to be at low risk of self harm in the current setting. - At this time, we recommend  routine. This decision is based on my review of the chart including patient's history and current presentation, interview of the patient, mental status examination, and consideration of suicide risk including evaluating suicidal ideation, plan, intent, suicidal or self-harm behaviors, risk factors, and protective factors. This judgment is based on our ability to directly address suicide risk, implement suicide prevention strategies, and develop a safety plan while the patient is in the clinical setting. Please contact our team if there is a concern that risk level has changed.  CSSR Risk Category:C-SSRS RISK CATEGORY: Error: Q3, 4, or 5 should not be populated when Q2 is No  Suicide Risk Assessment: Patient has following modifiable risk factors for suicide is low: no past suicide attempts, denies depression, no psychiatric admissions which we are addressing with the Ativan/phenobarbital detox protocol. Patient has following non-modifiable or demographic risk factors for suicide: male gender  Patient has the following protective factors against suicide: no history of suicide attempts and no history of NSSIB  Thank you for this consult  request. Recommendations have been communicated to the primary team.  We will sign off at this time.   Gary Means, NP       History of Present Illness  Relevant Aspects of [name] Course:  Admitted on 12/17/2023 for abdominal pain and alcohol withdrawal. They are being treated for both.  Multiple hospitalizations per the chart for similar presentations.   Patient Report:  "My stomach hurts like hell" were the first words used upon entering his room.  When this provider introduced herself, he responded with, "I don't need psych.  I don't need psych meds".  He was irritable and denied depression and suicidal ideations, no hallucinations.  His anxiety was high along with cravings for alcohol and sweats with tremors that he described when discussing withdrawal symptoms.  "I'm always like this.  I had these symptoms (sweating and tremors) in prison for a month."  His appetite is decreased related to his abdominal pains, sleep was fair.  He was upset that he is not receiving narcotics and threatening to sue everyone.  When explored medications to assist with his anxiety, he stated, "I need alcohol or benzodiazepines, that's the only thing that helps".  He would not entertain another option despite explaining that benzodiazepines operate in the brain the same way alcohol does and they would not be prescribed outside of his detox taper, which he voiced "Thanks for nothing".  He did stated he was drinking two fifths of vodka daily prior to admission along with some meth and cocaine use.  Discussed rehab which he was only interested in if he got a prescription for benzodiazepines, this provider explained this would not be ordered and was exclusionary criteria for rehabs.  The client continued to push for benzodiazepines throughout the assessment, politely declined his requests.    12/21/23: The MD contacted psych to let them know that he is willing to take anything for his anxiety.  On assessment, he was  pleasantly eating his dinner while expressing the desire to go to rehab, [facility] consult placed for him.  He continues to have high anxiety and low depression.  Discussed Seroquel as an option which he liked as he heard that it does help with anxiety.  Consulted with Dr. Gasper Sells who is in agreement to start the medications and this provider will follow up tomorrow with the client.  12/22/23: On assessment, the client was resting in his bed. Today he states "I can't do the things I need to do, I am a much better man than this.", he denies depression and suicidal ideations. He reports that his anxiety is severe and he believes is related to a symptom of withdrawal. Due to his anxiety he reports sleeping horribly last night. Last night, to cope with his anxiety, he ordered as much food as possible and ate until he had severe stomach pain. This morning he denies any stomach pain. He denies physical withdrawal symptoms as well as cravings. He is not interested in Naltrexone at this time but he is agreeable to increasing his Seroquel to BID which he is reporting helping.  He reports that he is still interested in substance abuse treatment once he is medically cleared. He was calm, cooperative and pleasant on today's assessment.   Psych ROS:  Depression: denied Anxiety:  high Mania (lifetime and current): denied Psychosis: (lifetime and current): psychosis  when using meth at times  Review of Systems  Constitutional:  Positive for malaise/fatigue.  HENT: Negative.    Eyes: Negative.   Respiratory: Negative.    Cardiovascular: Negative.   Gastrointestinal: Negative.   Genitourinary: Negative.   Musculoskeletal: Negative.   Skin: Negative.   Neurological: Negative.   Endo/Heme/Allergies: Negative.   Psychiatric/Behavioral:  Positive for substance abuse. The patient is nervous/anxious.      Psychiatric and Social History  Psychiatric History:  Information collected from chart and patient.  Prev  Dx/Sx: polysubstance dependency, alcohol dependency with complicated withdrawal Current Psych Provider: none Home Meds (current): none, noncompliant with medical medications, including his HIV ones Previous Med Trials: gabapentin, propranolol, hydroxyzine Therapy: none currently  Prior Psych Hospitalization: denied  Prior Self Harm: denied Prior Violence: denied  Family Psych History: substance abuse Family Hx suicide: none  Social History:  Legal Hx: past issues with prison time Living Situation: homeless shelter  Access to weapons/lethal [name]: denies   Substance History Alcohol: 2 1/5s of vodka daily  Type of alcohol vodka Last Drink the day before admission Number of drinks per day 1, 1/5s of vodka History of alcohol withdrawal seizures yes History of DT's yes Tobacco: yes Illicit drugs: meth, cocaine Prescription drug abuse: if available Rehab hx: [facility]  Exam Findings   Vital Signs:  Temp:  [97.9 F (36.6 C)-98.2 F (36.8 C)] 97.9 F (36.6 C) (02/12 0340) Pulse Rate:  [53-72] 53 (02/12 0340) Resp:  [14-20] 14 (02/12 0340) BP: (94-99)/(62-65) 94/62 (02/12 0340) SpO2:  [99 %-100 %] 99 % (02/12 0340) Blood pressure 94/62, pulse (!) 53, temperature 97.9 F (36.6 C), temperature source Oral, resp. rate 14, height 6' (1.829 m), weight 59.1 kg, SpO2 99%. Body mass index is 17.67 kg/m.  Physical Exam Vitals and nursing note reviewed.  HENT:     Head: Normocephalic.  Pulmonary:     Effort: Pulmonary effort is normal.  Neurological:     General: No focal deficit present.     Mental Status: He is alert and oriented to person, place, and time.     Mental Status Exam: General Appearance: emaciated   Orientation:  Full (Time, Place, and Person)  Memory:  Immediate;   Good Recent;   Good Remote;   Good  Concentration:  Concentration: Fair and Attention Span: Fair  Recall:  Good  Attention  Fair  Eye Contact:  Good  Speech:  Normal Rate  Language:  Good   Volume:  Normal  Mood: anxious  Affect:  Congruent  Thought Process:  Coherent  Thought Content:  Logical  Suicidal Thoughts:  No  Homicidal Thoughts:  No  Judgement:  Fair  Insight:  Lacking  Psychomotor Activity:  Decreased  Akathisia:  No  Fund of Knowledge:  Fair      Assets:  Resilience  Cognition:  WNL  ADL's:  Intact  AIMS (if indicated):        Other History   These have been pulled in through the EMR, reviewed, and updated if appropriate.  Family History:  The patient's family history includes Alcohol abuse in his father and mother; Cancer in an other family member; Colon cancer in an other family member; Other in an other family member.  Medical History: Past Medical History:  Diagnosis Date   Alcohol abuse    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   Anxiety    Bipolar 2 disorder (HCC)    HIV (human immunodeficiency virus infection) (HCC)    Pancreatitis  PTSD (post-traumatic stress disorder)    Schizophrenia (HCC)    Seizures (HCC)    Subdural hematoma [facility])     Surgical History: Past Surgical History:  Procedure Laterality Date   BIOPSY  04/19/2022   Procedure: BIOPSY;  Surgeon: Meridee Score Netty Starring., MD;  Location[facility] ENDOSCOPY;  Service: Gastroenterology;;   ENTEROSCOPY N/A 04/19/2022   Procedure: ENTEROSCOPY;  Surgeon: Lemar Lofty., MD;  Location: [facility] ENDOSCOPY;  Service: Gastroenterology;  Laterality: N/A;   INCISION AND DRAINAGE PERIRECTAL ABSCESS N/A 09/24/2016   Procedure: IRRIGATION AND DEBRIDEMENT PERIRECTAL ABSCESS;  Surgeon: Ricarda Frame, MD;  Location: [facility] ORS;  Service: General;  Laterality: N/A;   none       Medications:   Current Facility-Administered Medications:    acetaminophen (TYLENOL) tablet 650 mg, 650 mg, Oral, Q6H PRN, 650 mg at 12/21/23 2133 **OR** acetaminophen (TYLENOL) suppository 650 mg, 650 mg, Rectal, Q6H PRN, Bobette Mo, MD   feeding supplement (BOOST / RESOURCE BREEZE) liquid 1 Container,  1 Container, Oral, TID BM, Bobette Mo, MD, 1 Container at 12/21/23 1714   folic acid (FOLVITE) tablet 1 mg, 1 mg, Oral, Daily, Bobette Mo, MD, 1 mg at 12/21/23 1036   levETIRAcetam (KEPPRA) tablet 500 mg, 500 mg, Oral, BID, Azucena Fallen, MD, 500 mg at 12/21/23 2133   multivitamin with minerals tablet 1 tablet, 1 tablet, Oral, Daily, Bobette Mo, MD, 1 tablet at 12/21/23 1036   ondansetron (ZOFRAN) tablet 4 mg, 4 mg, Oral, Q6H PRN, 4 mg at 12/21/23 1036 **OR** ondansetron (ZOFRAN) injection 4 mg, 4 mg, Intravenous, Q6H PRN, Bobette Mo, MD, 4 mg at 12/18/23 2235   pantoprazole (PROTONIX) EC tablet 40 mg, 40 mg, Oral, Daily, Norva Pavlov, RPH, 40 mg at 12/21/23 1036   PHENobarbital (LUMINAL) tablet 16.2 mg, 16.2 mg, Oral, BID, Wofford, Drew A, RPH, 16.2 mg at 12/21/23 2133   QUEtiapine (SEROQUEL) tablet 25 mg, 25 mg, Oral, Daily PRN, Charm Rings, NP, 25 mg at 12/22/23 0830   thiamine (VITAMIN B1) tablet 100 mg, 100 mg, Oral, Daily, 100 mg at 12/21/23 1036 **OR** [DISCONTINUED] thiamine (VITAMIN B1) injection 100 mg, 100 mg, Intravenous, Daily, Bobette Mo, MD, 100 mg at 12/17/23 1540  Allergies: Allergies  Allergen Reactions   Tegretol [Carbamazepine] Other (See Comments)    Vertigo and paralysis   Caffeine Palpitations    Gary Means, NP

## 2023-12-23 DIAGNOSIS — K852 Alcohol induced acute pancreatitis without necrosis or infection: Secondary | ICD-10-CM | POA: Diagnosis not present

## 2023-12-23 DIAGNOSIS — F1020 Alcohol dependence, uncomplicated: Secondary | ICD-10-CM | POA: Diagnosis not present

## 2023-12-23 DIAGNOSIS — F10939 Alcohol use, unspecified with withdrawal, unspecified: Secondary | ICD-10-CM | POA: Diagnosis not present

## 2023-12-23 LAB — LIPASE, BLOOD: Lipase: 1125 U/L — ABNORMAL HIGH (ref 11–51)

## 2023-12-23 MED ORDER — SODIUM CHLORIDE 0.9 % IV SOLN: INTRAVENOUS | Status: DC

## 2023-12-23 MED ORDER — ALUM & MAG HYDROXIDE-SIMETH 200-200-20 MG/5ML PO SUSP
30.0000 mL | Freq: Once | ORAL | Status: AC
  Administered 2023-12-23: 30 mL via ORAL
  Filled 2023-12-23: qty 30

## 2023-12-23 MED ORDER — MORPHINE SULFATE (PF) 2 MG/ML IV SOLN
2.0000 mg | INTRAVENOUS | Status: DC | PRN
  Administered 2023-12-23: 2 mg via INTRAVENOUS
  Filled 2023-12-23: qty 1

## 2023-12-23 MED ORDER — HYDROMORPHONE HCL 1 MG/ML IJ SOLN
0.5000 mg | INTRAMUSCULAR | Status: DC | PRN
  Administered 2023-12-23 – 2023-12-26 (×13): 0.5 mg via INTRAVENOUS
  Filled 2023-12-23 (×13): qty 0.5

## 2023-12-23 MED ORDER — DIPHENHYDRAMINE HCL 50 MG/ML IJ SOLN
25.0000 mg | Freq: Once | INTRAMUSCULAR | Status: AC
  Administered 2023-12-23: 25 mg via INTRAVENOUS
  Filled 2023-12-23: qty 1

## 2023-12-23 NOTE — TOC Progression Note (Signed)
Transition of Care [facility]) - Progression Note    Patient Details  Name: Gary [name] MRN: [id number] Date of Birth: [birth year]  Transition of Care [facility] CM/SW Contact  Larrie Kass, LCSW Phone Number: 12/23/2023, 3:13 PM  Clinical Narrative:     CSW received a call from [facility] requesting pt's Clinical information. CSW received permission to fax out clinicals to [facility]. TOC to follow   Expected Discharge Plan:  (substance use rehab) Barriers to Discharge: Continued Medical Work up  Expected Discharge Plan and Services                                               Social Determinants of Health (SDOH) Interventions SDOH Screenings   Food Insecurity: Food Insecurity Present (12/17/2023)  Housing: High Risk (12/17/2023)  Transportation Needs: Unmet Transportation Needs (12/17/2023)  Utilities: Not At Risk (12/17/2023)  Recent Concern: Utilities - At Risk (11/11/2023)  Alcohol Screen: High Risk (07/25/2021)  Depression (PHQ2-9): Medium Risk (02/12/2021)  Financial Resource Strain: Patient Declined (10/11/2023)  Social Connections: Socially Isolated (12/17/2023)  Stress: No Stress Concern Present (06/30/2022)   Received from [facility]  Recent Concern: Stress - Stress Concern Present (06/25/2022)   Received from [facility]  Tobacco Use: High Risk (12/17/2023)    Readmission Risk Interventions    12/15/2023   11:23 AM 11/29/2023   10:27 AM 11/22/2023   10:36 AM  Readmission Risk Prevention Plan  Transportation Screening Complete Complete Complete  Medication Review Ocean[employer]) Complete Complete Complete  PCP or Specialist appointment within 3-5 days of discharge Complete  Complete  HRI or Home Care Consult Complete Complete Complete  SW Recovery Care/Counseling Consult Complete Complete Complete  Palliative Care Screening Not Applicable Not Applicable Not Applicable  Skilled Nursing Facility Not Applicable Not Applicable Not Applicable

## 2023-12-23 NOTE — Consult Note (Signed)
[facility] Consult Follow Up  Patient Name: .Gary [name]  MRN: [id number]  DOB: 02/20/1986  Consult Order details:  Orders (From admission, onward)     Start     Ordered   12/20/23 0736  IP CONSULT TO PSYCHIATRY       Comments: Recurrent admission with ?intentional self harm consuming alcohol with known pancreatitis in hopes to obtain narcotics. Desperately needs outpatient follow up  Ordering Provider: Azucena Fallen, MD  Provider:  (Not yet assigned)  Question Answer Comment  Location [facility]   Reason for Consult? Abnormal/aggressive behavoir      12/20/23 0736             Mode of Visit: In person    Psychiatry Consult Evaluation  Service Date: December 23, 2023 LOS:  LOS: 6 days  Chief Complaint "My stomach hurts like hell."  Primary Psychiatric Diagnoses  Alcohol withdrawal with complications 2.  Polysubstance use disorder, including opiates 3.  Anxiety  Assessment  Gary [name] is a 38 y.o. male admitted: Medicallyfor 12/17/2023  8:08 AM for abdominal pain and nausea and vomiting. He carries the psychiatric diagnoses of polysubstance dependence including opiates and has a past medical history of pancreatitis, HIV (noncompliant with meds), pancytopenia, constipation, seizure d/o  His current presentation of irritability and anxiety is most consistent with alcohol withdrawal symptoms. Current outpatient psychotropic medications include none. On initial examination, patient was irritable and anxious. Please see plan below for detailed recommendations.   Diagnoses:  Active Hospital problems: Principal Problem:   Acute alcoholic pancreatitis Active Problems:   Alcohol use disorder, severe, dependence (HCC)   HIV (human immunodeficiency virus infection) (HCC)   Nicotine dependence, cigarettes, uncomplicated   Homelessness   Seizure disorder (HCC)   Alcoholic fatty liver   Anxiety state    Plan   ## Psychiatric  Medication Recommendations:  Continue Seroquel to 50 mg BID  ## Medical Decision Making Capacity: Not specifically addressed in this encounter  ## Further Work-up:  -- none -- most recent EKG on 12/17/23 had QtC of 472 -- Pertinent labwork reviewed earlier this admission includes: CMP, HIV labs, GI panel, PT, glucose, EKG, CBC, hepatitis panels   ## Disposition:-- There are no psychiatric contraindications to discharge at this time  ## Behavioral / Environmental: -Utilize compassion and acknowledge the patient's experiences while setting clear and realistic expectations for care.    ## Safety and Observation Level:  - Based on my clinical evaluation, I estimate the patient to be at low risk of self harm in the current setting. - At this time, we recommend  routine. This decision is based on my review of the chart including patient's history and current presentation, interview of the patient, mental status examination, and consideration of suicide risk including evaluating suicidal ideation, plan, intent, suicidal or self-harm behaviors, risk factors, and protective factors. This judgment is based on our ability to directly address suicide risk, implement suicide prevention strategies, and develop a safety plan while the patient is in the clinical setting. Please contact our team if there is a concern that risk level has changed.  CSSR Risk Category:C-SSRS RISK CATEGORY: No Risk  Suicide Risk Assessment: Patient has following modifiable risk factors for suicide is low: no past suicide attempts, denies depression, no psychiatric admissions which we are addressing with the Ativan/phenobarbital detox protocol. Patient has following non-modifiable or demographic risk factors for suicide: male gender Patient has the following protective factors against suicide: no history  of suicide attempts and no history of NSSIB  Thank you for this consult request. Recommendations have been communicated to the  primary team.  We will sign off at this time.   Nanine Means, NP       History of Present Illness  Relevant Aspects of [facility] Course:  Admitted on 12/17/2023 for abdominal pain and alcohol withdrawal. They are being treated for both.  Multiple hospitalizations per the chart for similar presentations.   Patient Report:  "My stomach hurts like hell" were the first words used upon entering his room.  When this provider introduced herself, he responded with, "I don't need psych.  I don't need psych meds".  He was irritable and denied depression and suicidal ideations, no hallucinations.  His anxiety was high along with cravings for alcohol and sweats with tremors that he described when discussing withdrawal symptoms.  "I'm always like this.  I had these symptoms (sweating and tremors) in prison for a month."  His appetite is decreased related to his abdominal pains, sleep was fair.  He was upset that he is not receiving narcotics and threatening to sue everyone.  When explored medications to assist with his anxiety, he stated, "I need alcohol or benzodiazepines, that's the only thing that helps".  He would not entertain another option despite explaining that benzodiazepines operate in the brain the same way alcohol does and they would not be prescribed outside of his detox taper, which he voiced "Thanks for nothing".  He did stated he was drinking two fifths of vodka daily prior to admission along with some meth and cocaine use.  Discussed rehab which he was only interested in if he got a prescription for benzodiazepines, this provider explained this would not be ordered and was exclusionary criteria for rehabs.  The client continued to push for benzodiazepines throughout the assessment, politely declined his requests.    12/21/23: The MD contacted psych to let them know that he is willing to take anything for his anxiety.  On assessment, he was pleasantly eating his dinner while expressing the  desire to go to rehab, [facility] consult placed for him.  He continues to have high anxiety and low depression.  Discussed Seroquel as an option which he liked as he heard that it does help with anxiety.  Consulted with Dr. Gasper Sells who is in agreement to start the medications and this provider will follow up tomorrow with the client.  12/22/23: On assessment, the client was resting in his bed. Today he states "I can't do the things I need to do, I am a much better man than this.", he denies depression and suicidal ideations. He reports that his anxiety is severe and he believes is related to a symptom of withdrawal. Due to his anxiety he reports sleeping horribly last night. Last night, to cope with his anxiety, he ordered as much food as possible and ate until he had severe stomach pain. This morning he denies any stomach pain. He denies physical withdrawal symptoms as well as cravings. He is not interested in Naltrexone at this time but he is agreeable to increasing his Seroquel to BID which he is reporting helping.  He reports that he is still interested in substance abuse treatment once he is medically cleared. He was calm, cooperative and pleasant on today's assessment.   12/23/2023:  On assessment, the client was resting in his bed and the nurse was at his  bedside doing her morning medication pass. Per RN, his pain  was uncontrolled over night and he did not sleep. His Lipase jumped from 283 to 1125 within 24 hours. She had just given him IV morphine for pain control. The client reports having a horrible night related to his pain. He reports not sleeping at all despite saying "I didn't even have dreams of drinking last night.".  He denies any cravings and physical withdrawal symptoms. Denies depression and suicidal ideations. He rates his anxiety as severe and he believes it is related that to withdrawal symptoms. Per client, his appetite is "ok", he is scared to eat a full meal due to his pain increasing and  nausea associated with food intake. He remains uninterested in Naltrexone. His Seroquel was increased to 50mg  BID and he denies any medication side effects. He confirms that he is still interested in substance abuse treatment once he is medically cleared. During this assessment he was cooperative and pleasant.  Psych ROS:  Depression: denied Anxiety:  high Mania (lifetime and current): denied Psychosis: (lifetime and current): psychosis when using meth at times  Review of Systems  Constitutional:  Positive for malaise/fatigue.  HENT: Negative.    Eyes: Negative.   Respiratory: Negative.    Cardiovascular: Negative.   Gastrointestinal: Negative.   Genitourinary: Negative.   Musculoskeletal: Negative.   Skin: Negative.   Neurological: Negative.   Endo/Heme/Allergies: Negative.   Psychiatric/Behavioral:  Positive for substance abuse. The patient is nervous/anxious and has insomnia.      Psychiatric and Social History  Psychiatric History:  Information collected from chart and patient.  Prev Dx/Sx: polysubstance dependency, alcohol dependency with complicated withdrawal Current Psych Provider: none Home Meds (current): none, noncompliant with medical medications, including his HIV ones Previous Med Trials: gabapentin, propranolol, hydroxyzine Therapy: none currently  Prior Psych Hospitalization: denied  Prior Self Harm: denied Prior Violence: denied  Family Psych History: substance abuse Family Hx suicide: none  Social History:  Legal Hx: past issues with prison time Living Situation: homeless shelter  Access to weapons/lethal means: denies   Substance History Alcohol: 2 1/5s of vodka daily  Type of alcohol vodka Last Drink the day before admission Number of drinks per day 1, 1/5s of vodka History of alcohol withdrawal seizures yes History of DT's yes Tobacco: yes Illicit drugs: meth, cocaine Prescription drug abuse: if available Rehab hx: [facility]  Exam Findings    Vital Signs:  Temp:  [98.5 F (36.9 C)-98.7 F (37.1 C)] 98.5 F (36.9 C) (02/13 0439) Pulse Rate:  [65-66] 65 (02/13 0439) Resp:  [18-19] 18 (02/13 0439) BP: (96-100)/(64) 100/64 (02/13 0439) SpO2:  [98 %-100 %] 98 % (02/13 0439) Blood pressure 100/64, pulse 65, temperature 98.5 F (36.9 C), resp. rate 18, height 6' (1.829 m), weight 59.1 kg, SpO2 98%. Body mass index is 17.67 kg/m.  Physical Exam Vitals and nursing note reviewed.  HENT:     Head: Normocephalic.  Pulmonary:     Effort: Pulmonary effort is normal.  Neurological:     General: No focal deficit present.     Mental Status: He is alert and oriented to person, place, and time.     Mental Status Exam: General Appearance: emaciated   Orientation:  Full (Time, Place, and Person)  Memory:  Immediate;   Good Recent;   Good Remote;   Good  Concentration:  Concentration: Fair and Attention Span: Fair  Recall:  Good  Attention  Fair  Eye Contact:  Good  Speech:  Normal Rate  Language:  Good  Volume:  Normal  Mood: anxious  Affect:  Congruent  Thought Process:  Coherent  Thought Content:  Logical  Suicidal Thoughts:  No  Homicidal Thoughts:  No  Judgement:  Fair  Insight:  Lacking  Psychomotor Activity:  Decreased  Akathisia:  No  Fund of Knowledge:  Fair      Assets:  Resilience  Cognition:  WNL  ADL's:  Intact  AIMS (if indicated):        Other History   These have been pulled in through the EMR, reviewed, and updated if appropriate.  Family History:  The patient's family history includes Alcohol abuse in his father and mother; Cancer in an other family member; Colon cancer in an other family member; Other in an other family member.  Medical History: Past Medical History:  Diagnosis Date   Alcohol abuse    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   Anxiety    Bipolar 2 disorder (HCC)    HIV (human immunodeficiency virus infection) (HCC)    Pancreatitis    PTSD (post-traumatic stress  disorder)    Schizophrenia (HCC)    Seizures (HCC)    Subdural hematoma (HCC)     Surgical History: Past Surgical History:  Procedure Laterality Date   BIOPSY  04/19/2022   Procedure: BIOPSY;  Surgeon: Lemar Lofty., MD;  Location: [facility] ENDOSCOPY;  Service: Gastroenterology;;   ENTEROSCOPY N/A 04/19/2022   Procedure: ENTEROSCOPY;  Surgeon: Lemar Lofty., MD;  Location[facility] ENDOSCOPY;  Service: Gastroenterology;  Laterality: N/A;   INCISION AND DRAINAGE PERIRECTAL ABSCESS N/A 09/24/2016   Procedure: IRRIGATION AND DEBRIDEMENT PERIRECTAL ABSCESS;  Surgeon: Ricarda Frame, MD;  Location: [facility] ORS;  Service: General;  Laterality: N/A;   none       Medications:   Current Facility-Administered Medications:    acetaminophen (TYLENOL) tablet 650 mg, 650 mg, Oral, Q6H PRN, 650 mg at 12/22/23 2053 **OR** acetaminophen (TYLENOL) suppository 650 mg, 650 mg, Rectal, Q6H PRN, Bobette Mo, MD   bisacodyl (DULCOLAX) suppository 10 mg, 10 mg, Rectal, Daily PRN, Anthoney Harada, NP   diphenhydrAMINE (BENADRYL) injection 25 mg, 25 mg, Intravenous, Once, Lewie Chamber, MD   feeding supplement (BOOST / RESOURCE BREEZE) liquid 1 Container, 1 Container, Oral, TID BM, Bobette Mo, MD, 1 Container at 12/21/23 1714   folic acid (FOLVITE) tablet 1 mg, 1 mg, Oral, Daily, Bobette Mo, MD, 1 mg at 12/23/23 2956   HYDROmorphone (DILAUDID) injection 0.5 mg, 0.5 mg, Intravenous, Q4H PRN, Lewie Chamber, MD   levETIRAcetam (KEPPRA) tablet 500 mg, 500 mg, Oral, BID, Azucena Fallen, MD, 500 mg at 12/23/23 0920   multivitamin with minerals tablet 1 tablet, 1 tablet, Oral, Daily, Bobette Mo, MD, 1 tablet at 12/23/23 0921   ondansetron [name]) tablet 4 mg, 4 mg, Oral, Q6H PRN, 4 mg at 12/22/23 1721 **OR** ondansetron (ZOFRAN) injection 4 mg, 4 mg, Intravenous, Q6H PRN, Bobette Mo, MD, 4 mg at 12/23/23 0911   pantoprazole (PROTONIX) EC tablet 40 mg, 40 mg,  Oral, Daily, Norva Pavlov, RPH, 40 mg at 12/23/23 2130   PHENobarbital (LUMINAL) tablet 16.2 mg, 16.2 mg, Oral, BID, Wofford, Drew A, RPH, 16.2 mg at 12/23/23 0920   polyethylene glycol (MIRALAX / GLYCOLAX) packet 17 g, 17 g, Oral, Daily, Girguis, David, MD, 17 g at 12/22/23 1718   prochlorperazine (COMPAZINE) injection 5 mg, 5 mg, Intravenous, Q6H PRN, Anthoney Harada, NP   QUEtiapine (SEROQUEL) tablet 50 mg, 50 mg, Oral, BID, Charm Rings, NP, 50 mg at 12/23/23  9147   senna-docusate (Senokot-S) tablet 1 tablet, 1 tablet, Oral, BID PRN, Lewie Chamber, MD, 1 tablet at 12/22/23 1718   thiamine (VITAMIN B1) tablet 100 mg, 100 mg, Oral, Daily, 100 mg at 12/22/23 1229 **OR** [DISCONTINUED] thiamine (VITAMIN B1) injection 100 mg, 100 mg, Intravenous, Daily, Bobette Mo, MD, 100 mg at 12/17/23 1540  Allergies: Allergies  Allergen Reactions   Tegretol [Carbamazepine] Other (See Comments)    Vertigo and paralysis   Caffeine Palpitations    Nanine Means, NP

## 2023-12-24 DIAGNOSIS — K852 Alcohol induced acute pancreatitis without necrosis or infection: Secondary | ICD-10-CM | POA: Diagnosis not present

## 2023-12-24 DIAGNOSIS — F1020 Alcohol dependence, uncomplicated: Secondary | ICD-10-CM | POA: Diagnosis not present

## 2023-12-24 LAB — COMPREHENSIVE METABOLIC PANEL
ALT: 10 U/L (ref 0–44)
AST: 17 U/L (ref 15–41)
Albumin: 3.3 g/dL — ABNORMAL LOW (ref 3.5–5.0)
Alkaline Phosphatase: 34 U/L — ABNORMAL LOW (ref 38–126)
Anion gap: 9 (ref 5–15)
BUN: 8 mg/dL (ref 6–20)
CO2: 22 mmol/L (ref 22–32)
Calcium: 8.5 mg/dL — ABNORMAL LOW (ref 8.9–10.3)
Chloride: 103 mmol/L (ref 98–111)
Creatinine, Ser: 0.3 mg/dL — ABNORMAL LOW (ref 0.61–1.24)
GFR, Estimated: >60 mL/min (ref >60)
Glucose, Bld: 78 mg/dL (ref 70–99)
Potassium: 4.7 mmol/L (ref 3.5–5.1)
Sodium: 134 mmol/L — ABNORMAL LOW (ref 135–145)
Total Bilirubin: 0.6 mg/dL (ref 0.0–1.2)
Total Protein: 5.9 g/dL — ABNORMAL LOW (ref 6.5–8.1)

## 2023-12-24 LAB — CBC WITH DIFFERENTIAL/PLATELET
Abs Immature Granulocytes: 0.01 10*3/uL (ref 0.00–0.07)
Basophils Absolute: 0 10*3/uL (ref 0.0–0.1)
Basophils Relative: 1 %
Eosinophils Absolute: 0.2 10*3/uL (ref 0.0–0.5)
Eosinophils Relative: 5 %
HCT: 33 % — ABNORMAL LOW (ref 39.0–52.0)
Hemoglobin: 10.6 g/dL — ABNORMAL LOW (ref 13.0–17.0)
Immature Granulocytes: 0 %
Lymphocytes Relative: 51 %
Lymphs Abs: 1.8 10*3/uL (ref 0.7–4.0)
MCH: 30.5 pg (ref 26.0–34.0)
MCHC: 32.1 g/dL (ref 30.0–36.0)
MCV: 94.8 fL (ref 80.0–100.0)
Monocytes Absolute: 0.4 10*3/uL (ref 0.1–1.0)
Monocytes Relative: 10 %
Neutro Abs: 1.2 10*3/uL — ABNORMAL LOW (ref 1.7–7.7)
Neutrophils Relative %: 33 %
Platelets: 184 10*3/uL (ref 150–400)
RBC: 3.48 MIL/uL — ABNORMAL LOW (ref 4.22–5.81)
RDW: 15.4 % (ref 11.5–15.5)
WBC: 3.6 10*3/uL — ABNORMAL LOW (ref 4.0–10.5)
nRBC: 0 % (ref 0.0–0.2)

## 2023-12-24 LAB — LIPASE, BLOOD: Lipase: 254 U/L — ABNORMAL HIGH (ref 11–51)

## 2023-12-24 LAB — MAGNESIUM: Magnesium: 1.9 mg/dL (ref 1.7–2.4)

## 2023-12-24 MED ORDER — SMOG ENEMA
960.0000 mL | Freq: Once | RECTAL | Status: AC
  Administered 2023-12-24: 960 mL via RECTAL
  Filled 2023-12-24: qty 960

## 2023-12-24 MED ORDER — FLEET ENEMA RE ENEM: 1.0000 | ENEMA | Freq: Every day | RECTAL | Status: DC | PRN

## 2023-12-24 MED ORDER — SIMETHICONE 80 MG PO CHEW
80.0000 mg | CHEWABLE_TABLET | Freq: Four times a day (QID) | ORAL | Status: DC | PRN
  Administered 2023-12-24 – 2023-12-27 (×4): 80 mg via ORAL
  Filled 2023-12-24 (×4): qty 1

## 2023-12-24 MED ORDER — BISACODYL 10 MG RE SUPP: 10.0000 mg | Freq: Every day | RECTAL | Status: DC | PRN

## 2023-12-24 NOTE — TOC Progression Note (Signed)
Transition of Care [facility]) - Progression Note    Patient Details  Name: Gary [name] MRN: [id number] Date of Birth: 1985-12-28  [facility] CM/SW Contact  Larrie Kass, LCSW Phone Number: 12/24/2023, 1:28 PM  Clinical Narrative:    CSW received call from [facility] , they have declined pt due to not actively taking his HIV meds and no PCP established. TOC to follow.   Expected Discharge Plan:  (substance use rehab) Barriers to Discharge: Continued Medical Work up  Expected Discharge Plan and Services                                               Social Determinants of Health (SDOH) Interventions SDOH Screenings   Food Insecurity: Food Insecurity Present (12/17/2023)  Housing: High Risk (12/17/2023)  Transportation Needs: Unmet Transportation Needs (12/17/2023)  Utilities: Not At Risk (12/17/2023)  Recent Concern: Utilities - At Risk (11/11/2023)  Alcohol Screen: High Risk (07/25/2021)  Depression (PHQ2-9): Medium Risk (02/12/2021)  Financial Resource Strain: Patient Declined (10/11/2023)  Social Connections: Socially Isolated (12/17/2023)  Stress: No Stress Concern Present (06/30/2022)   Received from [facility]  Recent Concern: Stress - Stress Concern Present (06/25/2022)   Received from [facility]  Tobacco Use: High Risk (12/17/2023)    Readmission Risk Interventions    12/15/2023   11:23 AM 11/29/2023   10:27 AM 11/22/2023   10:36 AM  Readmission Risk Prevention Plan  Transportation Screening Complete Complete Complete  Medication Review Oceanographer) Complete Complete Complete  PCP or Specialist appointment within 3-5 days of discharge Complete  Complete  HRI or Home Care Consult Complete Complete Complete  SW Recovery Care/Counseling Consult Complete Complete Complete  Palliative Care Screening Not Applicable Not Applicable Not Applicable  Skilled Nursing Facility Not Applicable Not Applicable Not Applicable

## 2023-12-24 NOTE — Progress Notes (Signed)
Progress Note    Gary [name]   [id number]  DOB: October 03, 1986  DOA: 12/17/2023     7 PCP: Patient, No Pcp Per  Initial CC: abdominal pain  Hospital Course: Gary [name] is a 38 y.o. male with medical history significant of alcohol abuse, tobacco abuse, polysubstance abuse, AIDS not on treatment due to noncompliance,history of alcohol withdrawal and non-withdrawal seizures, bipolar 2 disorder, PTSD, schizophrenia, alcohol induced pancreatitis(chronic) who was recently admitted and discharged from 12/10/2023 until 12/15/2023 due to chronic alcoholic pancreatitis exacerbation and alcohol abuse.   Interval History:  Late entry: Patient pain 2/13 was more severe and lipase over 1000.  Starting back pain meds and reducing diet.   Assessment and Plan:  Recurrent hospitalizations, concern for self-harm to gain access to controlled substances History of major depressive disorder, rule out undiagnosed anxiety -Multiple hospitalizations despite education that ongoing alcohol use will cause worsening pancreatitis and pain. -Concern patient is intentionally harming himself to obtain controlled substances at our facility -Denies previous psychiatric follow-up -Patient lashing out 12/19/2023 threatening staff if he does not receive IV narcotics and IV benzos despite having p.o. medications available and is tolerating a diet without difficulty. -Appreciate psychiatric help with this difficult situation patient initially not agreeable for medical treatment but is now agreeable to start anti-anxiety medications now (discussed with Psych) - has been started on Seroquel    Acute recurrent alcoholic pancreatitis Secondary to noncompliance and alcohol abuse  - not tolerating diet advancement - lipase again up today - restart IVF - change diet back to FLD - given worsening pain and lipase further uptrended, it's appropriate to treat pain adequately; okay for some IV pain meds as needed    Alcohol use  disorder, severe, dependence CIWA protocol with lorazepam and phenobarbital taper. Folate, MVI and thiamine supplementation ongoing Patient indicates he will need lifelong benzos/pain medicine to keep him from having withdrawals - educated that withdrawals are short-lived and once he is detoxed he would not need chronic medications.   Seizure disorder (HCC) PO keppra ongoing Phenobarbital added to withdrawal protocol.   Transaminitis with hepatic steatosis Secondary to alcohol use and abuse, downtrending appropriately   AIDS, poorly controlled Due to noncompliance will not resume antiretrovirals - have patient follow up with ID for further discussion   Tobacco abuse nicotine dependence, cigarettes, uncomplicated Tobacco cessation advised. Nicotine replacement therapy ordered.   Known history of substance abuse Seeking behavior Avoid narcotics unless absolutely necessary as above   Old records reviewed in assessment of this patient  Antimicrobials:   DVT prophylaxis:  SCDs Start: 12/17/23 1503   Code Status:   Code Status: Full Code  Mobility Assessment (Last 72 Hours)     Mobility Assessment     Row Name 12/24/23 1000 12/24/23 0100 12/23/23 0930 12/22/23 2200 12/22/23 1100   Does patient have an order for bedrest or is patient medically unstable No - Continue assessment No - Continue assessment Yes- Bedfast (Level 1) - Complete No - Continue assessment Yes- Bedfast (Level 1) - Complete   What is the highest level of mobility based on the progressive mobility assessment? Level 6 (Walks independently in room and hall) - Balance while walking in room without assist - Complete Level 6 (Walks independently in room and hall) - Balance while walking in room without assist - Complete Level 6 (Walks independently in room and hall) - Balance while walking in room without assist - Complete Level 6 (Walks independently in room and hall) - Balance  while walking in room without assist -  Complete Level 6 (Walks independently in room and hall) - Balance while walking in room without assist - Complete   Is the above level different from baseline mobility prior to current illness? No - Consider discontinuing PT/OT No - Consider discontinuing PT/OT No - Consider discontinuing PT/OT No - Consider discontinuing PT/OT No - Consider discontinuing PT/OT    Row Name 12/21/23 2331 12/21/23 2329         Does patient have an order for bedrest or is patient medically unstable No - Continue assessment No - Continue assessment      What is the highest level of mobility based on the progressive mobility assessment? Level 6 (Walks independently in room and hall) - Balance while walking in room without assist - Complete Level 6 (Walks independently in room and hall) - Balance while walking in room without assist - Complete      Is the above level different from baseline mobility prior to current illness? No - Consider discontinuing PT/OT No - Consider discontinuing PT/OT               Barriers to discharge: none Disposition Plan:  Rehab? Status is: Inpt  Objective: Blood pressure (!) 91/58, pulse 60, temperature 98.3 F (36.8 C), temperature source Oral, resp. rate 18, height 6' (1.829 m), weight 59.1 kg, SpO2 100%.  Examination:  Physical Exam Constitutional:      General: He is not in acute distress.    Appearance: Normal appearance.  HENT:     Head: Normocephalic and atraumatic.     Mouth/Throat:     Mouth: Mucous membranes are moist.  Eyes:     Extraocular Movements: Extraocular movements intact.  Cardiovascular:     Rate and Rhythm: Normal rate and regular rhythm.  Pulmonary:     Effort: Pulmonary effort is normal. No respiratory distress.     Breath sounds: Normal breath sounds. No wheezing.  Abdominal:     General: Bowel sounds are normal. There is no distension.     Palpations: Abdomen is soft.     Tenderness: There is abdominal tenderness (generalized).   Musculoskeletal:        General: Normal range of motion.     Cervical back: Normal range of motion and neck supple.  Skin:    General: Skin is warm and dry.  Neurological:     General: No focal deficit present.     Mental Status: He is alert.  Psychiatric:        Mood and Affect: Mood normal.        Behavior: Behavior normal.      Consultants:  Psychiatry   Procedures:    Data Reviewed: Results for orders placed or performed during the hospital encounter of 12/17/23 (from the past 24 hours)  Lipase, blood     Status: Abnormal   Collection Time: 12/24/23  4:31 AM  Result Value Ref Range   Lipase 254 (H) 11 - 51 U/L  CBC with Differential/Platelet     Status: Abnormal   Collection Time: 12/24/23  4:31 AM  Result Value Ref Range   WBC 3.6 (L) 4.0 - 10.5 K/uL   RBC 3.48 (L) 4.22 - 5.81 MIL/uL   Hemoglobin 10.6 (L) 13.0 - 17.0 g/dL   HCT 16.1 (L) 09.6 - 04.5 %   MCV 94.8 80.0 - 100.0 fL   MCH 30.5 26.0 - 34.0 pg   MCHC 32.1 30.0 - 36.0 g/dL  RDW 15.4 11.5 - 15.5 %   Platelets 184 150 - 400 K/uL   nRBC 0.0 0.0 - 0.2 %   Neutrophils Relative % 33 %   Neutro Abs 1.2 (L) 1.7 - 7.7 K/uL   Lymphocytes Relative 51 %   Lymphs Abs 1.8 0.7 - 4.0 K/uL   Monocytes Relative 10 %   Monocytes Absolute 0.4 0.1 - 1.0 K/uL   Eosinophils Relative 5 %   Eosinophils Absolute 0.2 0.0 - 0.5 K/uL   Basophils Relative 1 %   Basophils Absolute 0.0 0.0 - 0.1 K/uL   Immature Granulocytes 0 %   Abs Immature Granulocytes 0.01 0.00 - 0.07 K/uL  Comprehensive metabolic panel     Status: Abnormal   Collection Time: 12/24/23  4:31 AM  Result Value Ref Range   Sodium 134 (L) 135 - 145 mmol/L   Potassium 4.7 3.5 - 5.1 mmol/L   Chloride 103 98 - 111 mmol/L   CO2 22 22 - 32 mmol/L   Glucose, Bld 78 70 - 99 mg/dL   BUN 8 6 - 20 mg/dL   Creatinine, Ser 9.14 (L) 0.61 - 1.24 mg/dL   Calcium 8.5 (L) 8.9 - 10.3 mg/dL   Total Protein 5.9 (L) 6.5 - 8.1 g/dL   Albumin 3.3 (L) 3.5 - 5.0 g/dL   AST 17  15 - 41 U/L   ALT 10 0 - 44 U/L   Alkaline Phosphatase 34 (L) 38 - 126 U/L   Total Bilirubin 0.6 0.0 - 1.2 mg/dL   GFR, Estimated >78 >29 mL/min   Anion gap 9 5 - 15  Magnesium     Status: None   Collection Time: 12/24/23  4:31 AM  Result Value Ref Range   Magnesium 1.9 1.7 - 2.4 mg/dL    I have reviewed pertinent nursing notes, vitals, labs, and images as necessary. I have ordered labwork to follow up on as indicated.  I have reviewed the last notes from staff over past 24 hours. I have discussed patient's care plan and test results with nursing staff, CM/SW, and other staff as appropriate.  Time spent: Greater than 50% of the 55 minute visit was spent in counseling/coordination of care for the patient as laid out in the A&P.  Late entry: Patient seen and examined 12/23/23   LOS: 7 days   Lewie Chamber, MD [facility] 12/24/2023, 2:13 PM

## 2023-12-24 NOTE — Progress Notes (Signed)
Progress Note    Gary [name]   [id number]  DOB: May 01, 1986  DOA: 12/17/2023     7 PCP: Patient, No Pcp Per  Initial CC: abdominal pain  Hospital Course: Gary [name] is a 38 y.o. male with medical history significant of alcohol abuse, tobacco abuse, polysubstance abuse, AIDS not on treatment due to noncompliance,history of alcohol withdrawal and non-withdrawal seizures, bipolar 2 disorder, PTSD, schizophrenia, alcohol induced pancreatitis(chronic) who was recently admitted and discharged from 12/10/2023 until 12/15/2023 due to chronic alcoholic pancreatitis exacerbation and alcohol abuse.   Interval History:  Lipase better today and better pain tolerance. Tolerated FLD and he wishes to again try advancing today. Unfortunately [facility] declined.   Assessment and Plan:  Recurrent hospitalizations, concern for self-harm to gain access to controlled substances History of major depressive disorder, rule out undiagnosed anxiety -Multiple hospitalizations despite education that ongoing alcohol use will cause worsening pancreatitis and pain. -Concern patient is intentionally harming himself to obtain controlled substances at our facility -Denies previous psychiatric follow-up -Patient lashing out 12/19/2023 threatening staff if he does not receive IV narcotics and IV benzos despite having p.o. medications available and is tolerating a diet without difficulty. -Appreciate psychiatric help with this difficult situation patient initially not agreeable for medical treatment but is now agreeable to start anti-anxiety medications now (discussed with Psych) - has been started on Seroquel    Acute recurrent alcoholic pancreatitis Secondary to noncompliance and alcohol abuse  - not tolerating diet advancement - lipase better today - continue IVF - advance to low fat diet - monitor response    Alcohol use disorder, severe, dependence CIWA protocol with lorazepam and phenobarbital  taper. Folate, MVI and thiamine supplementation ongoing Patient indicates he will need lifelong benzos/pain medicine to keep him from having withdrawals - educated that withdrawals are short-lived and once he is detoxed he would not need chronic medications.   Seizure disorder (HCC) PO keppra ongoing Phenobarbital added to withdrawal protocol.   Transaminitis with hepatic steatosis Secondary to alcohol use and abuse, downtrending appropriately   AIDS, poorly controlled Due to noncompliance will not resume antiretrovirals - have patient follow up with ID for further discussion   Tobacco abuse nicotine dependence, cigarettes, uncomplicated Tobacco cessation advised. Nicotine replacement therapy ordered.   Known history of substance abuse Seeking behavior Avoid narcotics unless absolutely necessary as above   Old records reviewed in assessment of this patient  Antimicrobials:   DVT prophylaxis:  SCDs Start: 12/17/23 1503   Code Status:   Code Status: Full Code  Mobility Assessment (Last 72 Hours)     Mobility Assessment     Row Name 12/24/23 1000 12/24/23 0100 12/23/23 0930 12/22/23 2200 12/22/23 1100   Does patient have an order for bedrest or is patient medically unstable No - Continue assessment No - Continue assessment Yes- Bedfast (Level 1) - Complete No - Continue assessment Yes- Bedfast (Level 1) - Complete   What is the highest level of mobility based on the progressive mobility assessment? Level 6 (Walks independently in room and hall) - Balance while walking in room without assist - Complete Level 6 (Walks independently in room and hall) - Balance while walking in room without assist - Complete Level 6 (Walks independently in room and hall) - Balance while walking in room without assist - Complete Level 6 (Walks independently in room and hall) - Balance while walking in room without assist - Complete Level 6 (Walks independently in room and hall) - Balance while walking  in room without assist - Complete   Is the above level different from baseline mobility prior to current illness? No - Consider discontinuing PT/OT No - Consider discontinuing PT/OT No - Consider discontinuing PT/OT No - Consider discontinuing PT/OT No - Consider discontinuing PT/OT    Row Name 12/21/23 2331 12/21/23 2329         Does patient have an order for bedrest or is patient medically unstable No - Continue assessment No - Continue assessment      What is the highest level of mobility based on the progressive mobility assessment? Level 6 (Walks independently in room and hall) - Balance while walking in room without assist - Complete Level 6 (Walks independently in room and hall) - Balance while walking in room without assist - Complete      Is the above level different from baseline mobility prior to current illness? No - Consider discontinuing PT/OT No - Consider discontinuing PT/OT               Barriers to discharge: none Disposition Plan:  Home/homeless Status is: Inpt  Objective: Blood pressure (!) 91/58, pulse 60, temperature 98.3 F (36.8 C), temperature source Oral, resp. rate 18, height 6' (1.829 m), weight 59.1 kg, SpO2 100%.  Examination:  Physical Exam Constitutional:      General: He is not in acute distress.    Appearance: Normal appearance.  HENT:     Head: Normocephalic and atraumatic.     Mouth/Throat:     Mouth: Mucous membranes are moist.  Eyes:     Extraocular Movements: Extraocular movements intact.  Cardiovascular:     Rate and Rhythm: Normal rate and regular rhythm.  Pulmonary:     Effort: Pulmonary effort is normal. No respiratory distress.     Breath sounds: Normal breath sounds. No wheezing.  Abdominal:     General: Bowel sounds are normal. There is no distension.     Palpations: Abdomen is soft.     Tenderness: There is abdominal tenderness (generalized).  Musculoskeletal:        General: Normal range of motion.     Cervical back: Normal  range of motion and neck supple.  Skin:    General: Skin is warm and dry.  Neurological:     General: No focal deficit present.     Mental Status: He is alert.  Psychiatric:        Mood and Affect: Mood normal.        Behavior: Behavior normal.      Consultants:  Psychiatry   Procedures:    Data Reviewed: Results for orders placed or performed during the hospital encounter of 12/17/23 (from the past 24 hours)  Lipase, blood     Status: Abnormal   Collection Time: 12/24/23  4:31 AM  Result Value Ref Range   Lipase 254 (H) 11 - 51 U/L  CBC with Differential/Platelet     Status: Abnormal   Collection Time: 12/24/23  4:31 AM  Result Value Ref Range   WBC 3.6 (L) 4.0 - 10.5 K/uL   RBC 3.48 (L) 4.22 - 5.81 MIL/uL   Hemoglobin 10.6 (L) 13.0 - 17.0 g/dL   HCT 60.4 (L) 54.0 - 98.1 %   MCV 94.8 80.0 - 100.0 fL   MCH 30.5 26.0 - 34.0 pg   MCHC 32.1 30.0 - 36.0 g/dL   RDW 19.1 47.8 - 29.5 %   Platelets 184 150 - 400 K/uL   nRBC 0.0 0.0 - 0.2 %  Neutrophils Relative % 33 %   Neutro Abs 1.2 (L) 1.7 - 7.7 K/uL   Lymphocytes Relative 51 %   Lymphs Abs 1.8 0.7 - 4.0 K/uL   Monocytes Relative 10 %   Monocytes Absolute 0.4 0.1 - 1.0 K/uL   Eosinophils Relative 5 %   Eosinophils Absolute 0.2 0.0 - 0.5 K/uL   Basophils Relative 1 %   Basophils Absolute 0.0 0.0 - 0.1 K/uL   Immature Granulocytes 0 %   Abs Immature Granulocytes 0.01 0.00 - 0.07 K/uL  Comprehensive metabolic panel     Status: Abnormal   Collection Time: 12/24/23  4:31 AM  Result Value Ref Range   Sodium 134 (L) 135 - 145 mmol/L   Potassium 4.7 3.5 - 5.1 mmol/L   Chloride 103 98 - 111 mmol/L   CO2 22 22 - 32 mmol/L   Glucose, Bld 78 70 - 99 mg/dL   BUN 8 6 - 20 mg/dL   Creatinine, Ser 8.65 (L) 0.61 - 1.24 mg/dL   Calcium 8.5 (L) 8.9 - 10.3 mg/dL   Total Protein 5.9 (L) 6.5 - 8.1 g/dL   Albumin 3.3 (L) 3.5 - 5.0 g/dL   AST 17 15 - 41 U/L   ALT 10 0 - 44 U/L   Alkaline Phosphatase 34 (L) 38 - 126 U/L   Total  Bilirubin 0.6 0.0 - 1.2 mg/dL   GFR, Estimated >78 >46 mL/min   Anion gap 9 5 - 15  Magnesium     Status: None   Collection Time: 12/24/23  4:31 AM  Result Value Ref Range   Magnesium 1.9 1.7 - 2.4 mg/dL    I have reviewed pertinent nursing notes, vitals, labs, and images as necessary. I have ordered labwork to follow up on as indicated.  I have reviewed the last notes from staff over past 24 hours. I have discussed patient's care plan and test results with nursing staff, CM/SW, and other staff as appropriate.  Time spent: Greater than 50% of the 55 minute visit was spent in counseling/coordination of care for the patient as laid out in the A&P.    LOS: 7 days   Lewie Chamber, MD [facility] 12/24/2023, 2:15 PM

## 2023-12-24 NOTE — Progress Notes (Signed)
Patient refuses for [place] to put an ID bracelet on his arm. His patient bracelet is cut and sitting on computer in patient room.

## 2023-12-25 ENCOUNTER — Other Ambulatory Visit (HOSPITAL_COMMUNITY): Payer: Self-pay

## 2023-12-25 ENCOUNTER — Inpatient Hospital Stay (HOSPITAL_COMMUNITY): Payer: MEDICAID

## 2023-12-25 DIAGNOSIS — K852 Alcohol induced acute pancreatitis without necrosis or infection: Secondary | ICD-10-CM | POA: Diagnosis not present

## 2023-12-25 DIAGNOSIS — B20 Human immunodeficiency virus [HIV] disease: Secondary | ICD-10-CM | POA: Diagnosis not present

## 2023-12-25 DIAGNOSIS — F4310 Post-traumatic stress disorder, unspecified: Secondary | ICD-10-CM

## 2023-12-25 DIAGNOSIS — K700 Alcoholic fatty liver: Secondary | ICD-10-CM | POA: Diagnosis not present

## 2023-12-25 DIAGNOSIS — F1020 Alcohol dependence, uncomplicated: Secondary | ICD-10-CM | POA: Diagnosis not present

## 2023-12-25 DIAGNOSIS — K567 Ileus, unspecified: Secondary | ICD-10-CM

## 2023-12-25 LAB — COMPREHENSIVE METABOLIC PANEL
ALT: 10 U/L (ref 0–44)
AST: 11 U/L — ABNORMAL LOW (ref 15–41)
Albumin: 3.4 g/dL — ABNORMAL LOW (ref 3.5–5.0)
Alkaline Phosphatase: 37 U/L — ABNORMAL LOW (ref 38–126)
Anion gap: 6 (ref 5–15)
BUN: 6 mg/dL (ref 6–20)
CO2: 25 mmol/L (ref 22–32)
Calcium: 9 mg/dL (ref 8.9–10.3)
Chloride: 105 mmol/L (ref 98–111)
Creatinine, Ser: 0.46 mg/dL — ABNORMAL LOW (ref 0.61–1.24)
GFR, Estimated: >60 mL/min (ref >60)
Glucose, Bld: 95 mg/dL (ref 70–99)
Potassium: 4.3 mmol/L (ref 3.5–5.1)
Sodium: 136 mmol/L (ref 135–145)
Total Bilirubin: 0.5 mg/dL (ref 0.0–1.2)
Total Protein: 6.4 g/dL — ABNORMAL LOW (ref 6.5–8.1)

## 2023-12-25 LAB — CBC WITH DIFFERENTIAL/PLATELET
Abs Immature Granulocytes: 0 10*3/uL (ref 0.00–0.07)
Basophils Absolute: 0 10*3/uL (ref 0.0–0.1)
Basophils Relative: 1 %
Eosinophils Absolute: 0.2 10*3/uL (ref 0.0–0.5)
Eosinophils Relative: 6 %
HCT: 32.4 % — ABNORMAL LOW (ref 39.0–52.0)
Hemoglobin: 10.1 g/dL — ABNORMAL LOW (ref 13.0–17.0)
Immature Granulocytes: 0 %
Lymphocytes Relative: 41 %
Lymphs Abs: 1.5 10*3/uL (ref 0.7–4.0)
MCH: 30.4 pg (ref 26.0–34.0)
MCHC: 31.2 g/dL (ref 30.0–36.0)
MCV: 97.6 fL (ref 80.0–100.0)
Monocytes Absolute: 0.4 10*3/uL (ref 0.1–1.0)
Monocytes Relative: 11 %
Neutro Abs: 1.5 10*3/uL — ABNORMAL LOW (ref 1.7–7.7)
Neutrophils Relative %: 41 %
Platelets: 210 10*3/uL (ref 150–400)
RBC: 3.32 MIL/uL — ABNORMAL LOW (ref 4.22–5.81)
RDW: 15.1 % (ref 11.5–15.5)
WBC: 3.6 10*3/uL — ABNORMAL LOW (ref 4.0–10.5)
nRBC: 0 % (ref 0.0–0.2)

## 2023-12-25 LAB — MAGNESIUM: Magnesium: 1.9 mg/dL (ref 1.7–2.4)

## 2023-12-25 LAB — LIPASE, BLOOD: Lipase: 338 U/L — ABNORMAL HIGH (ref 11–51)

## 2023-12-25 MED ORDER — MAGNESIUM CITRATE PO SOLN
1.0000 | Freq: Once | ORAL | Status: AC
  Administered 2023-12-25: 1 via ORAL
  Filled 2023-12-25: qty 296

## 2023-12-25 MED ORDER — BICTEGRAVIR-EMTRICITAB-TENOFOV 50-200-25 MG PO TABS
1.0000 | ORAL_TABLET | Freq: Every day | ORAL | Status: DC
  Administered 2023-12-25 – 2023-12-27 (×3): 1 via ORAL
  Filled 2023-12-25 (×3): qty 1

## 2023-12-25 MED ORDER — QUETIAPINE FUMARATE 50 MG PO TABS
50.0000 mg | ORAL_TABLET | Freq: Two times a day (BID) | ORAL | 3 refills | Status: DC
  Filled 2023-12-25: qty 60, 30d supply, fill #0

## 2023-12-25 NOTE — Progress Notes (Signed)
Progress Note    Gary [name]   ZOX:[id number]  DOB: [birth year]  DOA: 12/17/2023     8 PCP: Patient, No Pcp Per  Initial CC: abdominal pain  Hospital Course: Gary [name] is a 38 y.o. male with medical history significant of alcohol abuse, tobacco abuse, polysubstance abuse, AIDS not on treatment due to noncompliance,history of alcohol withdrawal and non-withdrawal seizures, bipolar 2 disorder, PTSD, schizophrenia, alcohol induced pancreatitis(chronic) who was recently admitted and discharged from 12/10/2023 until 12/15/2023 due to chronic alcoholic pancreatitis exacerbation and alcohol abuse.   Interval History:  Lipase somewhat stable.  Mostly tolerating low-fat diet but not eating too much.  Also having more bloating he says and difficulty with bowel movements.  Laxative regimen has been ordered.  Assessment and Plan:  Recurrent hospitalizations, concern for self-harm to gain access to controlled substances History of major depressive disorder, rule out undiagnosed anxiety -Multiple hospitalizations despite education that ongoing alcohol use will cause worsening pancreatitis and pain. -Concern patient is intentionally harming himself to obtain controlled substances at our facility -Denies previous psychiatric follow-up -Patient lashing out 12/19/2023 threatening staff if he does not receive IV narcotics and IV benzos despite having p.o. medications available and is tolerating a diet without difficulty. -Appreciate psychiatric help with this difficult situation patient initially not agreeable for medical treatment but is now agreeable to start anti-anxiety medications now (discussed with Psych) - has been started on Seroquel    Acute recurrent alcoholic pancreatitis Secondary to noncompliance and alcohol abuse  - lipase better today - continue IVF - advanced to low fat diet - monitor response    Alcohol use disorder, severe, dependence CIWA protocol with lorazepam and  phenobarbital taper. Folate, MVI and thiamine supplementation ongoing Patient indicates he will need lifelong benzos/pain medicine to keep him from having withdrawals - educated that withdrawals are short-lived and once he is detoxed he would not need chronic medications.   Seizure disorder (HCC) PO keppra ongoing Phenobarbital added to withdrawal protocol.   Transaminitis with hepatic steatosis Secondary to alcohol use and abuse, downtrending appropriately   AIDS, poorly controlled Due to noncompliance will not resume antiretrovirals - have patient follow up with ID for further discussion   Tobacco abuse nicotine dependence, cigarettes, uncomplicated Tobacco cessation advised. Nicotine replacement therapy ordered.   Known history of substance abuse Seeking behavior Avoid narcotics unless absolutely necessary as above   Old records reviewed in assessment of this patient  Antimicrobials:   DVT prophylaxis:  SCDs Start: 12/17/23 1503   Code Status:   Code Status: Full Code  Mobility Assessment (Last 72 Hours)     Mobility Assessment     Row Name 12/25/23 0800 12/24/23 2100 12/24/23 1000 12/24/23 0100 12/23/23 0930   Does patient have an order for bedrest or is patient medically unstable No - Continue assessment No - Continue assessment No - Continue assessment No - Continue assessment Yes- Bedfast (Level 1) - Complete   What is the highest level of mobility based on the progressive mobility assessment? Level 6 (Walks independently in room and hall) - Balance while walking in room without assist - Complete Level 6 (Walks independently in room and hall) - Balance while walking in room without assist - Complete Level 6 (Walks independently in room and hall) - Balance while walking in room without assist - Complete Level 6 (Walks independently in room and hall) - Balance while walking in room without assist - Complete Level 6 (Walks independently in room and hall) -  Balance while  walking in room without assist - Complete   Is the above level different from baseline mobility prior to current illness? No - Consider discontinuing PT/OT No - Consider discontinuing PT/OT No - Consider discontinuing PT/OT No - Consider discontinuing PT/OT No - Consider discontinuing PT/OT    Row Name 12/22/23 2200           Does patient have an order for bedrest or is patient medically unstable No - Continue assessment       What is the highest level of mobility based on the progressive mobility assessment? Level 6 (Walks independently in room and hall) - Balance while walking in room without assist - Complete       Is the above level different from baseline mobility prior to current illness? No - Consider discontinuing PT/OT                Barriers to discharge: none Disposition Plan:  Home/homeless Status is: Inpt  Objective: Blood pressure (!) 97/59, pulse (!) 56, temperature 98.4 F (36.9 C), temperature source Oral, resp. rate 18, height 6' (1.829 m), weight 59.1 kg, SpO2 100%.  Examination:  Physical Exam Constitutional:      General: He is not in acute distress.    Appearance: Normal appearance.  HENT:     Head: Normocephalic and atraumatic.     Mouth/Throat:     Mouth: Mucous membranes are moist.  Eyes:     Extraocular Movements: Extraocular movements intact.  Cardiovascular:     Rate and Rhythm: Normal rate and regular rhythm.  Pulmonary:     Effort: Pulmonary effort is normal. No respiratory distress.     Breath sounds: Normal breath sounds. No wheezing.  Abdominal:     General: Bowel sounds are normal. There is no distension.     Palpations: Abdomen is soft.     Tenderness: There is abdominal tenderness (generalized).  Musculoskeletal:        General: Normal range of motion.     Cervical back: Normal range of motion and neck supple.  Skin:    General: Skin is warm and dry.  Neurological:     General: No focal deficit present.     Mental Status: He is  alert.  Psychiatric:        Mood and Affect: Mood normal.        Behavior: Behavior normal.      Consultants:  Psychiatry   Procedures:    Data Reviewed: Results for orders placed or performed during the hospital encounter of 12/17/23 (from the past 24 hours)  Lipase, blood     Status: Abnormal   Collection Time: 12/25/23  4:22 AM  Result Value Ref Range   Lipase 338 (H) 11 - 51 U/L  CBC with Differential/Platelet     Status: Abnormal   Collection Time: 12/25/23  4:22 AM  Result Value Ref Range   WBC 3.6 (L) 4.0 - 10.5 K/uL   RBC 3.32 (L) 4.22 - 5.81 MIL/uL   Hemoglobin 10.1 (L) 13.0 - 17.0 g/dL   HCT 16.1 (L) 09.6 - 04.5 %   MCV 97.6 80.0 - 100.0 fL   MCH 30.4 26.0 - 34.0 pg   MCHC 31.2 30.0 - 36.0 g/dL   RDW 40.9 81.1 - 91.4 %   Platelets 210 150 - 400 K/uL   nRBC 0.0 0.0 - 0.2 %   Neutrophils Relative % 41 %   Neutro Abs 1.5 (L) 1.7 - 7.7 K/uL  Lymphocytes Relative 41 %   Lymphs Abs 1.5 0.7 - 4.0 K/uL   Monocytes Relative 11 %   Monocytes Absolute 0.4 0.1 - 1.0 K/uL   Eosinophils Relative 6 %   Eosinophils Absolute 0.2 0.0 - 0.5 K/uL   Basophils Relative 1 %   Basophils Absolute 0.0 0.0 - 0.1 K/uL   Immature Granulocytes 0 %   Abs Immature Granulocytes 0.00 0.00 - 0.07 K/uL  Comprehensive metabolic panel     Status: Abnormal   Collection Time: 12/25/23  4:22 AM  Result Value Ref Range   Sodium 136 135 - 145 mmol/L   Potassium 4.3 3.5 - 5.1 mmol/L   Chloride 105 98 - 111 mmol/L   CO2 25 22 - 32 mmol/L   Glucose, Bld 95 70 - 99 mg/dL   BUN 6 6 - 20 mg/dL   Creatinine, Ser 4.40 (L) 0.61 - 1.24 mg/dL   Calcium 9.0 8.9 - 10.2 mg/dL   Total Protein 6.4 (L) 6.5 - 8.1 g/dL   Albumin 3.4 (L) 3.5 - 5.0 g/dL   AST 11 (L) 15 - 41 U/L   ALT 10 0 - 44 U/L   Alkaline Phosphatase 37 (L) 38 - 126 U/L   Total Bilirubin 0.5 0.0 - 1.2 mg/dL   GFR, Estimated >72 >53 mL/min   Anion gap 6 5 - 15  Magnesium     Status: None   Collection Time: 12/25/23  4:22 AM  Result  Value Ref Range   Magnesium 1.9 1.7 - 2.4 mg/dL    I have reviewed pertinent nursing notes, vitals, labs, and images as necessary. I have ordered labwork to follow up on as indicated.  I have reviewed the last notes from staff over past 24 hours. I have discussed patient's care plan and test results with nursing staff, CM/SW, and other staff as appropriate.  Time spent: Greater than 50% of the 55 minute visit was spent in counseling/coordination of care for the patient as laid out in the A&P.    LOS: 8 days   Lewie Chamber, MD [facility] 12/25/2023, 1:02 PM

## 2023-12-25 NOTE — Progress Notes (Signed)
TOC med in a secure bag delivered to inpatient pharmacy  by this RN. Primary RN (Sherie)notified in person by this RN.

## 2023-12-25 NOTE — Consult Note (Signed)
Date of Admission:  12/17/2023          Reason for Consult: HIV disease with intermittent adherence    Referring Provider: Lewie Chamber, MD   Assessment:  HIV disease with intermittent adherence to ARV, with MAJORITY of his meds having been dispensed at hospital DC--he has shown ability to take meds IF he actually has them on hand  Severe alcohol use disorder Pancreatitis Homelessness Seizure disorder PTSD ? Secondary gain  Plan:  Tammy Sours We can call [facility] on Monday and connect Jeran to [employer] to coordinate his clinic follow-up post hospial DC and for several months help him with getting to clinic Would again provide him with 30 days of Biktarvy at DC via [employer]   Principal Problem:   Acute alcoholic pancreatitis Active Problems:   HIV (human immunodeficiency virus infection) (HCC)   Nicotine dependence, cigarettes, uncomplicated   Alcohol use disorder, severe, dependence (HCC)   Homelessness   Seizure disorder (HCC)   Alcoholic fatty liver   Anxiety state   Ileus (HCC)   Scheduled Meds:  bictegravir-emtricitabine-tenofovir AF  1 tablet Oral Daily   feeding supplement  1 Container Oral TID BM   folic acid  1 mg Oral Daily   levETIRAcetam  500 mg Oral BID   multivitamin with minerals  1 tablet Oral Daily   pantoprazole  40 mg Oral Daily   PHENobarbital  16.2 mg Oral BID   polyethylene glycol  17 g Oral Daily   QUEtiapine  50 mg Oral BID   thiamine  100 mg Oral Daily   Continuous Infusions:  sodium chloride 100 mL/hr at 12/25/23 0601   PRN Meds:.acetaminophen **OR** acetaminophen, bisacodyl, HYDROmorphone (DILAUDID) injection, ondansetron **OR** ondansetron (ZOFRAN) IV, prochlorperazine, senna-docusate, simethicone, sodium phosphate  HPI: Gary [name] is a 38 y.o. male with HIV disease and intermittent adherence who suffers from substance abuse disorder in particular alcohol use disorder that is severe with multiple  admissions to the hospital related to alcohol induced pathology including pancreatitis with some suspicion of secondary gain.  I saw him nearly a year ago and tried to arrange for outpatient follow-up.  He was eventually seen by my partner Dr. Renold Don while he was in jail and brought to [facility] by the police for follow-up. He is currently homeless and is again hospitalized for alcohol induced pancreatitis  His viral load from the end of January was 90 copies and his CD4 count was 598.  I expect this was because he was given a 30-day supply of Biktarvy by the discharging hospitalist who took care of him at the beginning of January.  When I look at his fill rate based on medication dispense history he appears to fill BIKTARVY in June 20 02 January 2023 December 2023 July 2023 with the majority of these dispenses coming from [place] or [place] long transition of care pharmacy.  I think for him to have regular antiretroviral therapy he will need to be helped by a bridge counselor which I attempted to arrange the last time I saw him.  I would recommend keeping him through Monday so that we can then give a call to the bridge counselor at [facility] and facilitate date and time where she could bring Tor to the clinic and we can schedule his appointment accordingly.  He will need help with housing and hopefully try to help with project can help with that as well.  Dr.  Thedore Mins will be back on Monday.  I have personally spent 82 minutes involved in face-to-face and non-face-to-face activities for this patient on the day of the visit. Professional time spent includes the following activities: Preparing to see the patient (review of tests), Obtaining and/or reviewing separately obtained history (admission/discharge record), Performing a medically appropriate examination and/or evaluation , Ordering medications/tests/procedures, referring and communicating with other health care professionals,  Documenting clinical information in the EMR, Independently interpreting results (not separately reported), Communicating results to the patient/family/caregiver, Counseling and educating the patient/family/caregiver and Care coordination (not separately reported).   Evaluation of the patient requires complex antimicrobial therapy evaluation, counseling , isolation needs to reduce disease transmission and risk assessment and mitigation.       Review of Systems: Review of Systems  Constitutional:  Negative for chills, fever, malaise/fatigue and weight loss.  HENT:  Negative for congestion and sore throat.   Eyes:  Negative for blurred vision and photophobia.  Respiratory:  Negative for cough, shortness of breath and wheezing.   Cardiovascular:  Negative for chest pain, palpitations and leg swelling.  Gastrointestinal:  Positive for abdominal pain, nausea and vomiting. Negative for blood in stool, constipation, diarrhea, heartburn and melena.  Genitourinary:  Negative for dysuria, flank pain and hematuria.  Musculoskeletal:  Negative for back pain, falls, joint pain and myalgias.  Skin:  Negative for itching and rash.  Neurological:  Negative for dizziness, focal weakness, loss of consciousness, weakness and headaches.  Endo/Heme/Allergies:  Does not bruise/bleed easily.  Psychiatric/Behavioral:  Positive for depression and substance abuse. Negative for suicidal ideas. The patient does not have insomnia.     Past Medical History:  Diagnosis Date   Alcohol abuse    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   Anxiety    Bipolar 2 disorder (HCC)    HIV (human immunodeficiency virus infection) (HCC)    Pancreatitis    PTSD (post-traumatic stress disorder)    Schizophrenia (HCC)    Seizures (HCC)    Subdural hematoma (HCC)     Social History   Tobacco Use   Smoking status: Every Day    Current packs/day: 0.50    Average packs/day: 0.5 packs/day for 21.1 years (10.6 ttl pk-yrs)     Types: Cigarettes    Start date: 2004   Smokeless tobacco: Never   Tobacco comments:    unable to smoke while incarcerated 6+ months 02/13/20  Vaping Use   Vaping status: Never Used  Substance Use Topics   Alcohol use: Yes    Alcohol/week: 105.0 standard drinks of alcohol    Types: 105 Cans of beer per week    Comment: drinks every day, all day, "whatever I can get my hands on". "Half a gallon of vodka" 12/16   Drug use: Yes    Types: Methamphetamines, Cocaine    Comment: last used 10/22/2023    Family History  Problem Relation Age of Onset   Alcohol abuse Mother    Alcohol abuse Father    Colon cancer Other    Other Other    Cancer Other    Allergies  Allergen Reactions   Tegretol [Carbamazepine] Other (See Comments)    Vertigo and paralysis   Caffeine Palpitations    OBJECTIVE: Blood pressure (!) 97/59, pulse (!) 56, temperature 98.4 F (36.9 C), temperature source Oral, resp. rate 18, height 6' (1.829 m), weight 59.1 kg, SpO2 100%.  Physical Exam  Lab Results Lab Results  Component Value Date   WBC 3.6 (L) 12/25/2023  HGB 10.1 (L) 12/25/2023   HCT 32.4 (L) 12/25/2023   MCV 97.6 12/25/2023   PLT 210 12/25/2023    Lab Results  Component Value Date   CREATININE 0.46 (L) 12/25/2023   BUN 6 12/25/2023   NA 136 12/25/2023   K 4.3 12/25/2023   CL 105 12/25/2023   CO2 25 12/25/2023    Lab Results  Component Value Date   ALT 10 12/25/2023   AST 11 (L) 12/25/2023   ALKPHOS 37 (L) 12/25/2023   BILITOT 0.5 12/25/2023     Microbiology: Recent Results (from the past 240 hours)  Gastrointestinal Panel by PCR , Stool     Status: None   Collection Time: 12/17/23 10:26 PM   Specimen: Stool  Result Value Ref Range Status   Campylobacter species NOT DETECTED NOT DETECTED Final   Plesimonas shigelloides NOT DETECTED NOT DETECTED Final   Salmonella species NOT DETECTED NOT DETECTED Final   Yersinia enterocolitica NOT DETECTED NOT DETECTED Final   Vibrio species  NOT DETECTED NOT DETECTED Final   Vibrio cholerae NOT DETECTED NOT DETECTED Final   Enteroaggregative E coli (EAEC) NOT DETECTED NOT DETECTED Final   Enteropathogenic E coli (EPEC) NOT DETECTED NOT DETECTED Final   Enterotoxigenic E coli (ETEC) NOT DETECTED NOT DETECTED Final   Shiga like toxin producing E coli (STEC) NOT DETECTED NOT DETECTED Final   Shigella/Enteroinvasive E coli (EIEC) NOT DETECTED NOT DETECTED Final   Cryptosporidium NOT DETECTED NOT DETECTED Final   Cyclospora cayetanensis NOT DETECTED NOT DETECTED Final   Entamoeba histolytica NOT DETECTED NOT DETECTED Final   Giardia lamblia NOT DETECTED NOT DETECTED Final   Adenovirus F40/41 NOT DETECTED NOT DETECTED Final   Astrovirus NOT DETECTED NOT DETECTED Final   Norovirus GI/GII NOT DETECTED NOT DETECTED Final   Rotavirus A NOT DETECTED NOT DETECTED Final   Sapovirus (I, II, IV, and V) NOT DETECTED NOT DETECTED Final    Comment: Performed at [facility], [address] [place], [place] [address]    Acey Lav, MD [facility] [phone] pager  12/25/2023, 5:09 PM

## 2023-12-26 ENCOUNTER — Other Ambulatory Visit (HOSPITAL_COMMUNITY): Payer: Self-pay

## 2023-12-26 DIAGNOSIS — F1020 Alcohol dependence, uncomplicated: Secondary | ICD-10-CM | POA: Diagnosis not present

## 2023-12-26 DIAGNOSIS — K567 Ileus, unspecified: Secondary | ICD-10-CM

## 2023-12-26 DIAGNOSIS — K852 Alcohol induced acute pancreatitis without necrosis or infection: Secondary | ICD-10-CM | POA: Diagnosis not present

## 2023-12-26 LAB — COMPREHENSIVE METABOLIC PANEL
ALT: 11 U/L (ref 0–44)
AST: 11 U/L — ABNORMAL LOW (ref 15–41)
Albumin: 3.3 g/dL — ABNORMAL LOW (ref 3.5–5.0)
Alkaline Phosphatase: 37 U/L — ABNORMAL LOW (ref 38–126)
Anion gap: 6 (ref 5–15)
BUN: 11 mg/dL (ref 6–20)
CO2: 26 mmol/L (ref 22–32)
Calcium: 9.2 mg/dL (ref 8.9–10.3)
Chloride: 104 mmol/L (ref 98–111)
Creatinine, Ser: 0.52 mg/dL — ABNORMAL LOW (ref 0.61–1.24)
GFR, Estimated: >60 mL/min (ref >60)
Glucose, Bld: 114 mg/dL — ABNORMAL HIGH (ref 70–99)
Potassium: 4.2 mmol/L (ref 3.5–5.1)
Sodium: 136 mmol/L (ref 135–145)
Total Bilirubin: <0.2 mg/dL (ref 0.0–1.2)
Total Protein: 6.1 g/dL — ABNORMAL LOW (ref 6.5–8.1)

## 2023-12-26 LAB — CBC WITH DIFFERENTIAL/PLATELET
Abs Immature Granulocytes: 0.01 10*3/uL (ref 0.00–0.07)
Basophils Absolute: 0 10*3/uL (ref 0.0–0.1)
Basophils Relative: 1 %
Eosinophils Absolute: 0.2 10*3/uL (ref 0.0–0.5)
Eosinophils Relative: 5 %
HCT: 30.3 % — ABNORMAL LOW (ref 39.0–52.0)
Hemoglobin: 9.8 g/dL — ABNORMAL LOW (ref 13.0–17.0)
Immature Granulocytes: 0 %
Lymphocytes Relative: 41 %
Lymphs Abs: 1.3 10*3/uL (ref 0.7–4.0)
MCH: 31.2 pg (ref 26.0–34.0)
MCHC: 32.3 g/dL (ref 30.0–36.0)
MCV: 96.5 fL (ref 80.0–100.0)
Monocytes Absolute: 0.3 10*3/uL (ref 0.1–1.0)
Monocytes Relative: 10 %
Neutro Abs: 1.3 10*3/uL — ABNORMAL LOW (ref 1.7–7.7)
Neutrophils Relative %: 43 %
Platelets: 199 10*3/uL (ref 150–400)
RBC: 3.14 MIL/uL — ABNORMAL LOW (ref 4.22–5.81)
RDW: 15.1 % (ref 11.5–15.5)
WBC: 3.2 10*3/uL — ABNORMAL LOW (ref 4.0–10.5)
nRBC: 0 % (ref 0.0–0.2)

## 2023-12-26 LAB — LIPASE, BLOOD: Lipase: 112 U/L — ABNORMAL HIGH (ref 11–51)

## 2023-12-26 LAB — MAGNESIUM: Magnesium: 2.1 mg/dL (ref 1.7–2.4)

## 2023-12-26 MED ORDER — HYDROMORPHONE HCL 1 MG/ML IJ SOLN
0.5000 mg | Freq: Three times a day (TID) | INTRAMUSCULAR | Status: DC | PRN
  Administered 2023-12-26 – 2023-12-27 (×3): 0.5 mg via INTRAVENOUS
  Filled 2023-12-26 (×3): qty 0.5

## 2023-12-26 MED ORDER — BICTEGRAVIR-EMTRICITAB-TENOFOV 50-200-25 MG PO TABS
1.0000 | ORAL_TABLET | Freq: Every day | ORAL | 0 refills | Status: DC
  Filled 2023-12-26: qty 90, 90d supply, fill #0
  Filled 2023-12-27: qty 30, 30d supply, fill #0

## 2023-12-26 MED ORDER — OXYCODONE HCL 5 MG PO TABS
5.0000 mg | ORAL_TABLET | Freq: Four times a day (QID) | ORAL | Status: DC | PRN
  Administered 2023-12-26 – 2023-12-27 (×3): 5 mg via ORAL
  Filled 2023-12-26 (×3): qty 1

## 2023-12-26 NOTE — Progress Notes (Signed)
Progress Note    Gary [name]   ZOX:[id number]  DOB: 24-Jan-1986  DOA: 12/17/2023     9 PCP: Patient, No Pcp Per  Initial CC: abdominal pain  Hospital Course: Gary [name] is a 38 y.o. male with medical history significant of alcohol abuse, tobacco abuse, polysubstance abuse, AIDS not on treatment due to noncompliance,history of alcohol withdrawal and non-withdrawal seizures, bipolar 2 disorder, PTSD, schizophrenia, alcohol induced pancreatitis(chronic) who was recently admitted and discharged from 12/10/2023 until 12/15/2023 due to chronic alcoholic pancreatitis exacerbation and alcohol abuse.   Interval History:  Lipase downtrended and stable. Tolerating diet fairly well and pain tolerable/improving.  Also was seen by ID yesterday and resumed back on Biktarvy.  He states the mag citrate helped with bowel movement as well.  Assessment and Plan:  Acute recurrent alcoholic pancreatitis Secondary to noncompliance and alcohol abuse  - lipase better today - d/c IVF - continue low fat diet - monitor response   Alcohol use disorder, severe, dependence - s/p CIWA protocol with lorazepam and phenobarbital taper. Folate, MVI and thiamine supplementation ongoing Patient indicates he will need lifelong benzos/pain medicine to keep him from having withdrawals - educated that withdrawals are short-lived and once he is detoxed he would not need chronic medications.   AIDS, poorly controlled Due to noncompliance will not resume antiretrovirals usually b/c of lack of transport or access - appreciate ID eval while here; has been resumed back on Biktarvy - per ID: "We can call [facility] on Monday and connect Kento to [employer] to coordinate his clinic follow-up post hospial DC and for several months help him with getting to clinic "  Concern for malingering Anxiety Hx MDD -Multiple hospitalizations despite education that ongoing alcohol use will cause worsening pancreatitis and pain. - on  admission, there was concern patient intentionally harming himself to obtain controlled substances at our facility -Denies previous psychiatric follow-up -Previous behavior from 12/19/2023 now resolved and he has been cooperative and compliant -Appreciate psychiatric help with this difficult situation; patient initially not agreeable for medical treatment but is now agreeable to start anti-anxiety medications now (discussed with Psych) - has been started on Seroquel   Seizure disorder (HCC) PO keppra ongoing Phenobarbital added to withdrawal protocol.   Transaminitis with hepatic steatosis Secondary to alcohol use and abuse, downtrending appropriately   Tobacco abuse nicotine dependence, cigarettes, uncomplicated Tobacco cessation advised. Nicotine replacement therapy ordered.   Known history of substance abuse Seeking behavior Avoid narcotics unless absolutely necessary as above   Old records reviewed in assessment of this patient  Antimicrobials:   DVT prophylaxis:  SCDs Start: 12/17/23 1503   Code Status:   Code Status: Full Code  Mobility Assessment (Last 72 Hours)     Mobility Assessment     Row Name 12/26/23 0800 12/25/23 2045 12/25/23 0800 12/24/23 2100 12/24/23 1000   Does patient have an order for bedrest or is patient medically unstable No - Continue assessment No - Continue assessment No - Continue assessment No - Continue assessment No - Continue assessment   What is the highest level of mobility based on the progressive mobility assessment? Level 6 (Walks independently in room and hall) - Balance while walking in room without assist - Complete Level 6 (Walks independently in room and hall) - Balance while walking in room without assist - Complete Level 6 (Walks independently in room and hall) - Balance while walking in room without assist - Complete Level 6 (Walks independently in room and hall) -  Balance while walking in room without assist - Complete Level 6 (Walks  independently in room and hall) - Balance while walking in room without assist - Complete   Is the above level different from baseline mobility prior to current illness? No - Consider discontinuing PT/OT No - Consider discontinuing PT/OT No - Consider discontinuing PT/OT No - Consider discontinuing PT/OT No - Consider discontinuing PT/OT    Row Name 12/24/23 0100           Does patient have an order for bedrest or is patient medically unstable No - Continue assessment       What is the highest level of mobility based on the progressive mobility assessment? Level 6 (Walks independently in room and hall) - Balance while walking in room without assist - Complete       Is the above level different from baseline mobility prior to current illness? No - Consider discontinuing PT/OT                Barriers to discharge: none Disposition Plan:  Home/homeless Status is: Inpt  Objective: Blood pressure 103/67, pulse (!) 56, temperature 98.8 F (37.1 C), temperature source Oral, resp. rate 20, height 6' (1.829 m), weight 59.1 kg, SpO2 100%.  Examination:  Physical Exam Constitutional:      General: He is not in acute distress.    Appearance: Normal appearance.  HENT:     Head: Normocephalic and atraumatic.     Mouth/Throat:     Mouth: Mucous membranes are moist.  Eyes:     Extraocular Movements: Extraocular movements intact.  Cardiovascular:     Rate and Rhythm: Normal rate and regular rhythm.  Pulmonary:     Effort: Pulmonary effort is normal. No respiratory distress.     Breath sounds: Normal breath sounds. No wheezing.  Abdominal:     General: Bowel sounds are normal. There is no distension.     Palpations: Abdomen is soft.     Tenderness: There is abdominal tenderness (generalized).  Musculoskeletal:        General: Normal range of motion.     Cervical back: Normal range of motion and neck supple.  Skin:    General: Skin is warm and dry.  Neurological:     General: No focal  deficit present.     Mental Status: He is alert.  Psychiatric:        Mood and Affect: Mood normal.        Behavior: Behavior normal.      Consultants:  Psychiatry  ID  Procedures:    Data Reviewed: Results for orders placed or performed during the hospital encounter of 12/17/23 (from the past 24 hours)  Lipase, blood     Status: Abnormal   Collection Time: 12/26/23  3:42 AM  Result Value Ref Range   Lipase 112 (H) 11 - 51 U/L  CBC with Differential/Platelet     Status: Abnormal   Collection Time: 12/26/23  3:42 AM  Result Value Ref Range   WBC 3.2 (L) 4.0 - 10.5 K/uL   RBC 3.14 (L) 4.22 - 5.81 MIL/uL   Hemoglobin 9.8 (L) 13.0 - 17.0 g/dL   HCT 16.1 (L) 09.6 - 04.5 %   MCV 96.5 80.0 - 100.0 fL   MCH 31.2 26.0 - 34.0 pg   MCHC 32.3 30.0 - 36.0 g/dL   RDW 40.9 81.1 - 91.4 %   Platelets 199 150 - 400 K/uL   nRBC 0.0 0.0 - 0.2 %  Neutrophils Relative % 43 %   Neutro Abs 1.3 (L) 1.7 - 7.7 K/uL   Lymphocytes Relative 41 %   Lymphs Abs 1.3 0.7 - 4.0 K/uL   Monocytes Relative 10 %   Monocytes Absolute 0.3 0.1 - 1.0 K/uL   Eosinophils Relative 5 %   Eosinophils Absolute 0.2 0.0 - 0.5 K/uL   Basophils Relative 1 %   Basophils Absolute 0.0 0.0 - 0.1 K/uL   Immature Granulocytes 0 %   Abs Immature Granulocytes 0.01 0.00 - 0.07 K/uL  Comprehensive metabolic panel     Status: Abnormal   Collection Time: 12/26/23  3:42 AM  Result Value Ref Range   Sodium 136 135 - 145 mmol/L   Potassium 4.2 3.5 - 5.1 mmol/L   Chloride 104 98 - 111 mmol/L   CO2 26 22 - 32 mmol/L   Glucose, Bld 114 (H) 70 - 99 mg/dL   BUN 11 6 - 20 mg/dL   Creatinine, Ser 6.29 (L) 0.61 - 1.24 mg/dL   Calcium 9.2 8.9 - 52.8 mg/dL   Total Protein 6.1 (L) 6.5 - 8.1 g/dL   Albumin 3.3 (L) 3.5 - 5.0 g/dL   AST 11 (L) 15 - 41 U/L   ALT 11 0 - 44 U/L   Alkaline Phosphatase 37 (L) 38 - 126 U/L   Total Bilirubin <0.2 0.0 - 1.2 mg/dL   GFR, Estimated >41 >32 mL/min   Anion gap 6 5 - 15  Magnesium     Status:  None   Collection Time: 12/26/23  3:42 AM  Result Value Ref Range   Magnesium 2.1 1.7 - 2.4 mg/dL    I have reviewed pertinent nursing notes, vitals, labs, and images as necessary. I have ordered labwork to follow up on as indicated.  I have reviewed the last notes from staff over past 24 hours. I have discussed patient's care plan and test results with nursing staff, CM/SW, and other staff as appropriate.  Time spent: Greater than 50% of the 55 minute visit was spent in counseling/coordination of care for the patient as laid out in the A&P.    LOS: 9 days   Lewie Chamber, MD [facility] 12/26/2023, 1:41 PM

## 2023-12-27 ENCOUNTER — Other Ambulatory Visit: Payer: Self-pay

## 2023-12-27 ENCOUNTER — Other Ambulatory Visit: Payer: Self-pay | Admitting: Family

## 2023-12-27 ENCOUNTER — Other Ambulatory Visit (HOSPITAL_COMMUNITY): Payer: Self-pay

## 2023-12-27 DIAGNOSIS — K567 Ileus, unspecified: Secondary | ICD-10-CM | POA: Diagnosis not present

## 2023-12-27 DIAGNOSIS — K852 Alcohol induced acute pancreatitis without necrosis or infection: Secondary | ICD-10-CM | POA: Diagnosis not present

## 2023-12-27 DIAGNOSIS — B20 Human immunodeficiency virus [HIV] disease: Secondary | ICD-10-CM | POA: Diagnosis not present

## 2023-12-27 DIAGNOSIS — F1020 Alcohol dependence, uncomplicated: Secondary | ICD-10-CM | POA: Diagnosis not present

## 2023-12-27 LAB — CBC WITH DIFFERENTIAL/PLATELET
Abs Immature Granulocytes: 0.01 10*3/uL (ref 0.00–0.07)
Basophils Absolute: 0 10*3/uL (ref 0.0–0.1)
Basophils Relative: 1 %
Eosinophils Absolute: 0.2 10*3/uL (ref 0.0–0.5)
Eosinophils Relative: 4 %
HCT: 31.6 % — ABNORMAL LOW (ref 39.0–52.0)
Hemoglobin: 10.1 g/dL — ABNORMAL LOW (ref 13.0–17.0)
Immature Granulocytes: 0 %
Lymphocytes Relative: 33 %
Lymphs Abs: 1.3 10*3/uL (ref 0.7–4.0)
MCH: 30.7 pg (ref 26.0–34.0)
MCHC: 32 g/dL (ref 30.0–36.0)
MCV: 96 fL (ref 80.0–100.0)
Monocytes Absolute: 0.4 10*3/uL (ref 0.1–1.0)
Monocytes Relative: 10 %
Neutro Abs: 2 10*3/uL (ref 1.7–7.7)
Neutrophils Relative %: 52 %
Platelets: 207 10*3/uL (ref 150–400)
RBC: 3.29 MIL/uL — ABNORMAL LOW (ref 4.22–5.81)
RDW: 14.8 % (ref 11.5–15.5)
WBC: 3.9 10*3/uL — ABNORMAL LOW (ref 4.0–10.5)
nRBC: 0 % (ref 0.0–0.2)

## 2023-12-27 LAB — COMPREHENSIVE METABOLIC PANEL
ALT: 10 U/L (ref 0–44)
AST: 11 U/L — ABNORMAL LOW (ref 15–41)
Albumin: 3.6 g/dL (ref 3.5–5.0)
Alkaline Phosphatase: 39 U/L (ref 38–126)
Anion gap: 10 (ref 5–15)
BUN: 12 mg/dL (ref 6–20)
CO2: 23 mmol/L (ref 22–32)
Calcium: 9.4 mg/dL (ref 8.9–10.3)
Chloride: 102 mmol/L (ref 98–111)
Creatinine, Ser: 0.5 mg/dL — ABNORMAL LOW (ref 0.61–1.24)
GFR, Estimated: >60 mL/min (ref >60)
Glucose, Bld: 134 mg/dL — ABNORMAL HIGH (ref 70–99)
Potassium: 3.7 mmol/L (ref 3.5–5.1)
Sodium: 135 mmol/L (ref 135–145)
Total Bilirubin: 0.2 mg/dL (ref 0.0–1.2)
Total Protein: 6.6 g/dL (ref 6.5–8.1)

## 2023-12-27 LAB — LIPASE, BLOOD: Lipase: 148 U/L — ABNORMAL HIGH (ref 11–51)

## 2023-12-27 LAB — MAGNESIUM: Magnesium: 1.8 mg/dL (ref 1.7–2.4)

## 2023-12-27 MED ORDER — ORAL CARE MOUTH RINSE: 15.0000 mL | OROMUCOSAL | Status: DC | PRN

## 2023-12-27 NOTE — Progress Notes (Signed)
 refer

## 2023-12-27 NOTE — Progress Notes (Deleted)
Brief ID note - Referred to [facility], bridge counselor Sheletha aware.  Will reach out to patient in regards to transportation to appointment at [facility] - Appointment on 01/10/2024 at [facility]

## 2023-12-27 NOTE — Progress Notes (Signed)
AVS reviewed w/ patient who verbalized an understanding. TOC meds retrieved from inpatient pharmacy & from outpt pharmacy - all meds in a secure bag- delivered by this RN to pt . PIV removed by primary RN. Pt dressing for d/c to home after showering. Seroquel given to pt after he is dressed. Bus pass x2 to pt.

## 2023-12-27 NOTE — Progress Notes (Signed)
Brief ID note - Referred to [facility], bridge counselor Sheletha aware.  Will reach out to patient in regards to transportation to appointment at [facility] - Appointment on 01/10/2024 at [facility]

## 2023-12-27 NOTE — Discharge Summary (Signed)
Physician Discharge Summary   Gary [name] XBJ:[id number] DOB: 1986-11-07 DOA: 12/17/2023  PCP: Patient, No Pcp Per  Admit date: 12/17/2023 Discharge date: 12/27/2023   Admitted From: Home  Disposition:  Home Discharging physician: Lewie Chamber, MD Barriers to discharge: none  Recommendations at discharge: Follow up with ID clinic as scheduled   Discharge Condition: stable CODE STATUS: Full  Diet recommendation:  Diet Orders (From admission, onward)     Start     Ordered   12/27/23 0000  Diet general        12/27/23 0938   12/24/23 1051  Diet Heart Room service appropriate? Yes; Fluid consistency: Thin  Diet effective now       Question Answer Comment  Room service appropriate? Yes   Fluid consistency: Thin      12/24/23 1050            Hospital Course: Gary [name] is a 38 y.o. male with medical history significant of alcohol abuse, tobacco abuse, polysubstance abuse, AIDS not on treatment due to noncompliance,history of alcohol withdrawal and non-withdrawal seizures, bipolar 2 disorder, PTSD, schizophrenia, alcohol induced pancreatitis(chronic) who was recently admitted and discharged from 12/10/2023 until 12/15/2023 due to chronic alcoholic pancreatitis exacerbation and alcohol abuse.  See below for further plan.  Assessment and Plan:  Acute recurrent alcoholic pancreatitis Secondary to noncompliance and alcohol abuse  - lipase better  - d/c IVF - tolerated diet well prior to d/c   Alcohol use disorder, severe, dependence - s/p CIWA protocol with lorazepam and phenobarbital taper. Folate, MVI and thiamine supplementation ongoing Patient indicates he will need lifelong benzos/pain medicine to keep him from having withdrawals - educated that withdrawals are short-lived and once he is detoxed he would not need chronic medications.   AIDS, poorly controlled Due to noncompliance will not resume antiretrovirals usually b/c of lack of transport or access -  appreciate ID eval while here; has been resumed back on Biktarvy - [facility] aware on 2/17 per ID; assistance being arranged to help get patient to appts   Concern for malingering Anxiety Hx MDD -Multiple hospitalizations despite education that ongoing alcohol use will cause worsening pancreatitis and pain. - on admission, there was concern patient intentionally harming himself to obtain controlled substances at our facility -Denies previous psychiatric follow-up -Previous behavior from 12/19/2023 now resolved and he has been cooperative and compliant -Appreciate psychiatric help with this difficult situation; patient initially not agreeable for medical treatment but is now agreeable to start anti-anxiety medications now (discussed with Psych) - has been started on Seroquel    Seizure disorder (HCC) PO keppra ongoing - s/p course of phenobarb   Transaminitis with hepatic steatosis Secondary to alcohol use and abuse, downtrending appropriately   Tobacco abuse nicotine dependence, cigarettes, uncomplicated Tobacco cessation advised. Nicotine replacement therapy ordered   Known history of substance abuse Seeking behavior Avoid narcotics unless absolutely necessary as above      The patient's acute and chronic medical conditions were treated accordingly. On day of discharge, patient was felt deemed stable for discharge. Patient/family member advised to call PCP or come back to ER if needed.   Principal Diagnosis: Acute alcoholic pancreatitis  Discharge Diagnoses: Active Hospital Problems   Diagnosis Date Noted   Acute alcoholic pancreatitis 12/10/2023    Priority: 1.   Ileus (HCC) 12/25/2023    Priority: 2.   Alcohol use disorder, severe, dependence (HCC) 04/16/2019    Priority: 2.   HIV disease (HCC) 06/16/2018  Priority: 3.   Homelessness 05/28/2022    Priority: 5.   Nicotine dependence, cigarettes, uncomplicated 06/16/2018    Priority: 7.   PTSD (post-traumatic stress  disorder) 12/25/2023   Anxiety state 12/20/2023   Alcoholic fatty liver 09/25/2023   Seizure disorder (HCC) 12/17/2022    Resolved Hospital Problems   Diagnosis Date Noted Date Resolved   Tobacco abuse 12/17/2023 12/20/2023     Discharge Instructions     Diet general   Complete by: As directed    Increase activity slowly   Complete by: As directed       Allergies as of 12/27/2023       Reactions   Tegretol [carbamazepine] Other (See Comments)   Vertigo and paralysis   Caffeine Palpitations        Medication List     STOP taking these medications    bisacodyl 5 MG EC tablet Generic drug: bisacodyl   oxyCODONE 5 MG immediate release tablet Commonly known as: Oxy IR/ROXICODONE   sucralfate 1 g tablet Commonly known as: Carafate       TAKE these medications    Biktarvy 50-200-25 MG Tabs tablet Generic drug: bictegravir-emtricitabine-tenofovir AF Take 1 tablet by mouth daily.   levETIRAcetam 500 MG tablet Commonly known as: Keppra Take 1 tablet (500 mg total) by mouth 2 (two) times daily.   QUEtiapine 50 MG tablet Commonly known as: SEROQUEL Take 1 tablet (50 mg total) by mouth 2 (two) times daily.        Allergies  Allergen Reactions   Tegretol [Carbamazepine] Other (See Comments)    Vertigo and paralysis   Caffeine Palpitations    Consultations: ID  Procedures:   Discharge Exam: BP 96/61 (BP Location: Right Arm)   Pulse (!) 53   Temp 98 F (36.7 C) (Oral)   Resp 18   Ht 6' (1.829 m)   Wt (P) 79.4 kg   SpO2 98%   BMI (P) 23.74 kg/m  Physical Exam Constitutional:      General: He is not in acute distress.    Appearance: Normal appearance.  HENT:     Head: Normocephalic and atraumatic.     Mouth/Throat:     Mouth: Mucous membranes are moist.  Eyes:     Extraocular Movements: Extraocular movements intact.  Cardiovascular:     Rate and Rhythm: Normal rate and regular rhythm.  Pulmonary:     Effort: Pulmonary effort is normal.  No respiratory distress.     Breath sounds: Normal breath sounds. No wheezing.  Abdominal:     General: Bowel sounds are normal. There is no distension.     Palpations: Abdomen is soft.     Tenderness: There is abdominal tenderness (generalized).  Musculoskeletal:        General: Normal range of motion.     Cervical back: Normal range of motion and neck supple.  Skin:    General: Skin is warm and dry.  Neurological:     General: No focal deficit present.     Mental Status: He is alert.  Psychiatric:        Mood and Affect: Mood normal.        Behavior: Behavior normal.      The results of significant diagnostics from this hospitalization (including imaging, microbiology, ancillary and laboratory) are listed below for reference.   Microbiology: Recent Results (from the past 240 hours)  Gastrointestinal Panel by PCR , Stool     Status: None   Collection Time: 12/17/23  10:26 PM   Specimen: Stool  Result Value Ref Range Status   Campylobacter species NOT DETECTED NOT DETECTED Final   Plesimonas shigelloides NOT DETECTED NOT DETECTED Final   Salmonella species NOT DETECTED NOT DETECTED Final   Yersinia enterocolitica NOT DETECTED NOT DETECTED Final   Vibrio species NOT DETECTED NOT DETECTED Final   Vibrio cholerae NOT DETECTED NOT DETECTED Final   Enteroaggregative E coli (EAEC) NOT DETECTED NOT DETECTED Final   Enteropathogenic E coli (EPEC) NOT DETECTED NOT DETECTED Final   Enterotoxigenic E coli (ETEC) NOT DETECTED NOT DETECTED Final   Shiga like toxin producing E coli (STEC) NOT DETECTED NOT DETECTED Final   Shigella/Enteroinvasive E coli (EIEC) NOT DETECTED NOT DETECTED Final   Cryptosporidium NOT DETECTED NOT DETECTED Final   Cyclospora cayetanensis NOT DETECTED NOT DETECTED Final   Entamoeba histolytica NOT DETECTED NOT DETECTED Final   Giardia lamblia NOT DETECTED NOT DETECTED Final   Adenovirus F40/41 NOT DETECTED NOT DETECTED Final   Astrovirus NOT DETECTED NOT  DETECTED Final   Norovirus GI/GII NOT DETECTED NOT DETECTED Final   Rotavirus A NOT DETECTED NOT DETECTED Final   Sapovirus (I, II, IV, and V) NOT DETECTED NOT DETECTED Final    Comment: Performed at [facility], [address] [place], [place] [address]     Labs: BNP (last 3 results) Recent Labs    12/08/23 0452  BNP 78.3   Basic Metabolic Panel: Recent Labs  Lab 12/24/23 0431 12/25/23 0422 12/26/23 0342 12/27/23 0409  NA 134* 136 136 135  K 4.7 4.3 4.2 3.7  CL 103 105 104 102  CO2 22 25 26 23   GLUCOSE 78 95 114* 134*  BUN 8 6 11 12   CREATININE 0.30* 0.46* 0.52* 0.50*  CALCIUM 8.5* 9.0 9.2 9.4  MG 1.9 1.9 2.1 1.8   Liver Function Tests: Recent Labs  Lab 12/24/23 0431 12/25/23 0422 12/26/23 0342 12/27/23 0409  AST 17 11* 11* 11*  ALT 10 10 11 10   ALKPHOS 34* 37* 37* 39  BILITOT 0.6 0.5 <0.2 0.2  PROT 5.9* 6.4* 6.1* 6.6  ALBUMIN 3.3* 3.4* 3.3* 3.6   Recent Labs  Lab 12/23/23 0422 12/24/23 0431 12/25/23 0422 12/26/23 0342 12/27/23 0409  LIPASE 1,125* 254* 338* 112* 148*   No results for input(s): "AMMONIA" in the last 168 hours. CBC: Recent Labs  Lab 12/24/23 0431 12/25/23 0422 12/26/23 0342 12/27/23 0409  WBC 3.6* 3.6* 3.2* 3.9*  NEUTROABS 1.2* 1.5* 1.3* 2.0  HGB 10.6* 10.1* 9.8* 10.1*  HCT 33.0* 32.4* 30.3* 31.6*  MCV 94.8 97.6 96.5 96.0  PLT 184 210 199 207   Cardiac Enzymes: No results for input(s): "CKTOTAL", "CKMB", "CKMBINDEX", "TROPONINI" in the last 168 hours. BNP: Invalid input(s): "POCBNP" CBG: No results for input(s): "GLUCAP" in the last 168 hours. D-Dimer No results for input(s): "DDIMER" in the last 72 hours. Hgb A1c No results for input(s): "HGBA1C" in the last 72 hours. Lipid Profile No results for input(s): "CHOL", "HDL", "LDLCALC", "TRIG", "CHOLHDL", "LDLDIRECT" in the last 72 hours. Thyroid function studies No results for input(s): "TSH", "T4TOTAL", "T3FREE", "THYROIDAB" in the last 72 hours.  Invalid  input(s): "FREET3" Anemia work up No results for input(s): "VITAMINB12", "FOLATE", "FERRITIN", "TIBC", "IRON", "RETICCTPCT" in the last 72 hours. Urinalysis    Component Value Date/Time   COLORURINE YELLOW 12/10/2023 1617   APPEARANCEUR CLEAR 12/10/2023 1617   APPEARANCEUR Clear 11/15/2014 1755   LABSPEC 1.031 (H) 12/10/2023 1617   LABSPEC 1.002 11/15/2014 1755  PHURINE 5.0 12/10/2023 1617   GLUCOSEU NEGATIVE 12/10/2023 1617   GLUCOSEU Negative 11/15/2014 1755   HGBUR NEGATIVE 12/10/2023 1617   BILIRUBINUR NEGATIVE 12/10/2023 1617   BILIRUBINUR Negative 11/15/2014 1755   KETONESUR NEGATIVE 12/10/2023 1617   PROTEINUR 30 (A) 12/10/2023 1617   NITRITE NEGATIVE 12/10/2023 1617   LEUKOCYTESUR NEGATIVE 12/10/2023 1617   LEUKOCYTESUR Negative 11/15/2014 1755   Sepsis Labs Recent Labs  Lab 12/24/23 0431 12/25/23 0422 12/26/23 0342 12/27/23 0409  WBC 3.6* 3.6* 3.2* 3.9*   Microbiology Recent Results (from the past 240 hours)  Gastrointestinal Panel by PCR , Stool     Status: None   Collection Time: 12/17/23 10:26 PM   Specimen: Stool  Result Value Ref Range Status   Campylobacter species NOT DETECTED NOT DETECTED Final   Plesimonas shigelloides NOT DETECTED NOT DETECTED Final   Salmonella species NOT DETECTED NOT DETECTED Final   Yersinia enterocolitica NOT DETECTED NOT DETECTED Final   Vibrio species NOT DETECTED NOT DETECTED Final   Vibrio cholerae NOT DETECTED NOT DETECTED Final   Enteroaggregative E coli (EAEC) NOT DETECTED NOT DETECTED Final   Enteropathogenic E coli (EPEC) NOT DETECTED NOT DETECTED Final   Enterotoxigenic E coli (ETEC) NOT DETECTED NOT DETECTED Final   Shiga like toxin producing E coli (STEC) NOT DETECTED NOT DETECTED Final   Shigella/Enteroinvasive E coli (EIEC) NOT DETECTED NOT DETECTED Final   Cryptosporidium NOT DETECTED NOT DETECTED Final   Cyclospora cayetanensis NOT DETECTED NOT DETECTED Final   Entamoeba histolytica NOT DETECTED NOT DETECTED  Final   Giardia lamblia NOT DETECTED NOT DETECTED Final   Adenovirus F40/41 NOT DETECTED NOT DETECTED Final   Astrovirus NOT DETECTED NOT DETECTED Final   Norovirus GI/GII NOT DETECTED NOT DETECTED Final   Rotavirus A NOT DETECTED NOT DETECTED Final   Sapovirus (I, II, IV, and V) NOT DETECTED NOT DETECTED Final    Comment: Performed at [facility], [address] [place], [place] [address]    Procedures/Studies: [place] Abd Portable 1V Result Date: 12/25/2023 CLINICAL DATA:  Constipation. EXAM: PORTABLE ABDOMEN - 1 VIEW COMPARISON:  CT of the abdomen and pelvis on 12/17/2023. Prior abdominal films on 11/01/2023. FINDINGS: Diffuse air throughout nondilated small bowel and colon is suggestive of ileus pattern. No overt small bowel obstruction. No signs of free air. No abnormal calcifications. Visualized bony structures are unremarkable. IMPRESSION: Diffuse air throughout nondilated small bowel and colon suggestive of ileus pattern. Electronically Signed   By: Irish Lack M.D.   On: 12/25/2023 13:24   CT ABDOMEN PELVIS W CONTRAST Result Date: 12/17/2023 CLINICAL DATA:  Abdominal pain, acute, nonlocalized. Abdominal pain. Vomiting. EXAM: CT ABDOMEN AND PELVIS WITH CONTRAST TECHNIQUE: Multidetector CT imaging of the abdomen and pelvis was performed using the standard protocol following bolus administration of intravenous contrast. RADIATION DOSE REDUCTION: This exam was performed according to the departmental dose-optimization program which includes automated exposure control, adjustment of the mA and/or kV according to patient size and/or use of iterative reconstruction technique. CONTRAST:  OMNIPAQUE IOHEXOL 300 MG/ML  SOLN COMPARISON:  CT scan abdomen and pelvis from 12/05/2023. FINDINGS: Lower chest: Linear area of atelectasis/scarring noted in the left lower lobe. The lung bases are otherwise clear. No pleural effusion. The heart is normal in size. No pericardial effusion.  Hepatobiliary: The liver is normal in size. Non-cirrhotic configuration. No suspicious mass. These is diffuse hepatic steatosis. No intrahepatic or extrahepatic bile duct dilation. No calcified gallstones. Normal gallbladder wall thickness. No pericholecystic inflammatory  changes. Pancreas: There is mild-to-moderate fat stranding surrounding the pancreatic head/uncinate process, slightly less pronounced when compared to the prior exam. Redemonstration of at least 2, ill-defined hypoattenuating peripancreatic areas abutting the pancreas measuring 1.0 x 1.3 cm orthogonally on coronal plane in the pancreatic head region anteriorly and 1.0 x 1.2 cm orthogonally on coronal plane in the pancreatic head region, posteriorly. These areas were present on the prior examination but appears slightly enlarged. No discrete wall seen. There is also moderate narrowing of the superior mesenteric vein just before the confluence to form portal vein (which has increased since the prior study). There is mild-to-moderate narrowing of the proximal portion of portal vein (series 8, image 66), which also has increased since the prior study. Small/atrophic pancreatic body and absent pancreatic tail noted. Spleen: Within normal limits. No focal lesion. Adrenals/Urinary Tract: Adrenal glands are unremarkable. No suspicious renal mass. There is a stable subcentimeter simple cyst in the right kidney lower pole no hydronephrosis. No renal or ureteric calculi. Urinary bladder is under distended, precluding optimal assessment. However, no large mass or stones identified. No perivesical fat stranding. Stomach/Bowel: No disproportionate dilation of the small or large bowel loops. No evidence of abnormal bowel wall thickening or inflammatory changes. The appendix is unremarkable. Vascular/Lymphatic: No ascites or pneumoperitoneum. No abdominal or pelvic lymphadenopathy, by size criteria. No aneurysmal dilation of the major abdominal arteries.  Reproductive: Enlarged prostate. Symmetric seminal vesicles. Other: There is a tiny fat containing umbilical hernia. The soft tissues and abdominal wall are otherwise unremarkable. Musculoskeletal: No suspicious osseous lesions. IMPRESSION: 1. Redemonstration of fat stranding surrounding the pancreatic head/uncinate process and small amount of non walled-off fluid in the peripancreatic region. Findings favor sequela of pancreatitis with peripancreatic collections/pseudocyst. Correlate with serum lipase levels. 2. There is increasing mass effect with moderate narrowing of the superior mesenteric vein and mild-to-moderate narrowing of the proximal portal vein, as discussed above. No thrombosis. 3. No other acute inflammatory process identified within the abdomen or pelvis. 4. Multiple other nonacute observations, as described above. Electronically Signed   By: Jules Schick M.D.   On: 12/17/2023 14:20   CT ABDOMEN PELVIS W CONTRAST Result Date: 12/05/2023 CLINICAL DATA:  Acute severe pancreatitis with abdominal pain. EXAM: CT ABDOMEN AND PELVIS WITH CONTRAST TECHNIQUE: Multidetector CT imaging of the abdomen and pelvis was performed using the standard protocol following bolus administration of intravenous contrast. RADIATION DOSE REDUCTION: This exam was performed according to the departmental dose-optimization program which includes automated exposure control, adjustment of the mA and/or kV according to patient size and/or use of iterative reconstruction technique. CONTRAST:  75mL OMNIPAQUE IOHEXOL 350 MG/ML SOLN COMPARISON:  Multiple prior CTs since 2017. The 2 most recent are both with contrast and dated 11/25/2023 and 11/18/2023. 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 Stable appearance of overlying atelectasis in the left lower lobe. Lung bases are otherwise clear. The cardiac size is normal. There is scattered two-vessel coronary  calcific plaque in the LAD and right coronary arteries but this is unusual in a 38 year old and age-advanced. Consider risk factor modification unless already being done. Hepatobiliary: No focal liver abnormality is seen. No gallstones, gallbladder wall thickening, or biliary dilatation. The liver is mildly steatotic. Pancreas: Small pancreas with truncated tail section. Inflammatory stranding continues to be seen about the head and uncinate process with small amount of peripancreatic fluid in the area but less fluid than previously. Again noted is a 1.2 cm pseudocyst, previously  1.4 cm in the pancreatic head and neck junction. No other localizing collection is seen. The overall appearance is similar to 11/18/2023, as above with decreased peripancreatic fluid since 11/24/2018. There is no mass enhancement.  No focal necrosis is seen. Spleen: Mildly prominent, measures 14 cm length. No mass enhancement. Adrenals/Urinary Tract: There is no adrenal mass. 8 mm Bosniak 2 cyst in the inferior pole of the right kidney is too small to characterize but unchanged. Both kidneys are otherwise homogeneous. There is no urinary stone or obstruction. The bladder is unremarkable. Stomach/Bowel: There are chronic thickened folds in the duodenum likely reactive. No bowel obstruction or inflammation. No evidence of appendicitis. Moderate fecal stasis. Vascular/Lymphatic: No significant vascular findings are present. No enlarged abdominal or pelvic lymph nodes. Reproductive: Enlarged prostate 5.1 cm transverse, also unusual at this age. Other: No pelvic free fluid. No free hemorrhage, free air or abscess. No incarcerated hernia. Musculoskeletal: Degenerative disc disease and endplate irregularities of the lower thoracic spine. Transitional anatomy lumbosacral junction. No acute or other significant osseous findings. IMPRESSION: 1. Persistent inflammatory stranding about the head and uncinate process of the pancreas with small amount of  peripancreatic fluid but less fluid than previously. 2. 1.2 cm pseudocyst in the pancreatic head and neck junction, previously 1.4 cm. 3. No mass enhancement or focal necrosis. Truncated pancreatic tail. 4. Chronic thickened folds in the duodenum likely reactive. 5. Constipation. 6. Mild hepatic steatosis.  Mild splenomegaly. 7. Two-vessel coronary calcific plaque, unusual in a 38 year old and age-advanced. Consider risk factor modification unless already being done. 8. Enlarged prostate. 9. 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 Electronically Signed   By: Almira Bar M.D.   On: 12/05/2023 05:42     Time coordinating discharge: Over 30 minutes    Lewie Chamber, MD  [facility] 12/27/2023, 2:53 PM

## 2023-12-29 ENCOUNTER — Emergency Department (HOSPITAL_COMMUNITY)
Admission: EM | Admit: 2023-12-29 | Discharge: 2023-12-29 | Disposition: A | Discharge status: Home / Self Care | Payer: MEDICAID | Attending: Emergency Medicine | Admitting: Emergency Medicine

## 2023-12-29 ENCOUNTER — Other Ambulatory Visit (HOSPITAL_COMMUNITY): Payer: Self-pay

## 2023-12-29 ENCOUNTER — Encounter (HOSPITAL_COMMUNITY): Payer: Self-pay | Admitting: Emergency Medicine

## 2023-12-29 ENCOUNTER — Other Ambulatory Visit: Payer: Self-pay

## 2023-12-29 DIAGNOSIS — F1090 Alcohol use, unspecified, uncomplicated: Secondary | ICD-10-CM | POA: Diagnosis not present

## 2023-12-29 DIAGNOSIS — Z21 Asymptomatic human immunodeficiency virus [HIV] infection status: Secondary | ICD-10-CM | POA: Diagnosis not present

## 2023-12-29 DIAGNOSIS — R109 Unspecified abdominal pain: Secondary | ICD-10-CM | POA: Diagnosis present

## 2023-12-29 DIAGNOSIS — K852 Alcohol induced acute pancreatitis without necrosis or infection: Secondary | ICD-10-CM | POA: Insufficient documentation

## 2023-12-29 LAB — CBC WITH DIFFERENTIAL/PLATELET
Abs Immature Granulocytes: 0.02 10*3/uL (ref 0.00–0.07)
Basophils Absolute: 0 10*3/uL (ref 0.0–0.1)
Basophils Relative: 1 %
Eosinophils Absolute: 0.1 10*3/uL (ref 0.0–0.5)
Eosinophils Relative: 1 %
HCT: 39.6 % (ref 39.0–52.0)
Hemoglobin: 13.6 g/dL (ref 13.0–17.0)
Immature Granulocytes: 0 %
Lymphocytes Relative: 25 %
Lymphs Abs: 1.3 10*3/uL (ref 0.7–4.0)
MCH: 30.9 pg (ref 26.0–34.0)
MCHC: 34.3 g/dL (ref 30.0–36.0)
MCV: 90 fL (ref 80.0–100.0)
Monocytes Absolute: 0.3 10*3/uL (ref 0.1–1.0)
Monocytes Relative: 6 %
Neutro Abs: 3.6 10*3/uL (ref 1.7–7.7)
Neutrophils Relative %: 67 %
Platelets: 290 10*3/uL (ref 150–400)
RBC: 4.4 MIL/uL (ref 4.22–5.81)
RDW: 15.1 % (ref 11.5–15.5)
WBC: 5.3 10*3/uL (ref 4.0–10.5)
nRBC: 0 % (ref 0.0–0.2)

## 2023-12-29 LAB — COMPREHENSIVE METABOLIC PANEL
ALT: 15 U/L (ref 0–44)
AST: 23 U/L (ref 15–41)
Albumin: 4.8 g/dL (ref 3.5–5.0)
Alkaline Phosphatase: 58 U/L (ref 38–126)
Anion gap: 15 (ref 5–15)
BUN: 13 mg/dL (ref 6–20)
CO2: 23 mmol/L (ref 22–32)
Calcium: 10.1 mg/dL (ref 8.9–10.3)
Chloride: 99 mmol/L (ref 98–111)
Creatinine, Ser: 0.81 mg/dL (ref 0.61–1.24)
GFR, Estimated: >60 mL/min (ref >60)
Glucose, Bld: 106 mg/dL — ABNORMAL HIGH (ref 70–99)
Potassium: 3.8 mmol/L (ref 3.5–5.1)
Sodium: 137 mmol/L (ref 135–145)
Total Bilirubin: 0.4 mg/dL (ref 0.0–1.2)
Total Protein: 8.7 g/dL — ABNORMAL HIGH (ref 6.5–8.1)

## 2023-12-29 LAB — LIPASE, BLOOD: Lipase: 337 U/L — ABNORMAL HIGH (ref 11–51)

## 2023-12-29 MED ORDER — LACTATED RINGERS IV BOLUS
1000.0000 mL | Freq: Once | INTRAVENOUS | Status: AC
  Administered 2023-12-29: 1000 mL via INTRAVENOUS

## 2023-12-29 MED ORDER — HYDROMORPHONE HCL 1 MG/ML IJ SOLN
0.5000 mg | Freq: Once | INTRAMUSCULAR | Status: AC
  Administered 2023-12-29: 0.5 mg via INTRAVENOUS
  Filled 2023-12-29: qty 1

## 2023-12-29 MED ORDER — ACETAMINOPHEN 500 MG PO TABS
1000.0000 mg | ORAL_TABLET | Freq: Once | ORAL | Status: AC
  Administered 2023-12-29: 1000 mg via ORAL
  Filled 2023-12-29: qty 2

## 2023-12-29 MED ORDER — ONDANSETRON 4 MG PO TBDP
4.0000 mg | ORAL_TABLET | Freq: Three times a day (TID) | ORAL | 0 refills | Status: DC | PRN
  Filled 2023-12-29: qty 20, 7d supply, fill #0

## 2023-12-29 MED ORDER — METOCLOPRAMIDE HCL 5 MG/ML IJ SOLN
10.0000 mg | Freq: Once | INTRAMUSCULAR | Status: AC
  Administered 2023-12-29: 10 mg via INTRAVENOUS
  Filled 2023-12-29: qty 2

## 2023-12-29 MED ORDER — DIPHENHYDRAMINE HCL 25 MG PO CAPS
25.0000 mg | ORAL_CAPSULE | Freq: Once | ORAL | Status: AC
Start: 2023-12-29 — End: 2023-12-29
  Administered 2023-12-29: 25 mg via ORAL
  Filled 2023-12-29: qty 1

## 2023-12-29 NOTE — ED Notes (Signed)
 [place] PIV placed.

## 2023-12-29 NOTE — ED Notes (Addendum)
0.62mL of Hydromorphone wasted in wasted bin.  Witnessed by [employer] Nurse Felipa Furnace.

## 2023-12-29 NOTE — ED Triage Notes (Signed)
Pt abdominal pain since yesterday. Hx pancreatitis. ETOH on board.

## 2023-12-29 NOTE — ED Notes (Signed)
Getting ultrasound IV, patient normally needs [place] for IV start.

## 2023-12-29 NOTE — ED Provider Notes (Signed)
[place] EMERGENCY DEPARTMENT AT [facility] Provider Note   CSN: [id number] Arrival date & time: 12/29/23  4098     History  Chief Complaint  Patient presents with   Abdominal Pain    Gary [name] is a 38 y.o. male.   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  He states that this feels exactly like his pancreatitis has in the past.  He states alcohol to elicit this each time.  He denies any chest pain or difficulty breathing.  No unilateral or bilateral leg swelling.  No fevers chills or blood in stool.       Home Medications Prior to Admission medications   Medication Sig Start Date End Date Taking? Authorizing Provider  ondansetron (ZOFRAN-ODT) 4 MG disintegrating tablet Dissolve 1 tablet (4 mg total) by mouth every 8 (eight) hours as needed for nausea or vomiting. 12/29/23  Yes Dedric Ethington S, PA  bictegravir-emtricitabine-tenofovir AF (BIKTARVY) 50-200-25 MG TABS tablet Take 1 tablet by mouth daily. 12/27/23   Lewie Chamber, MD  levETIRAcetam (KEPPRA) 500 MG tablet Take 1 tablet (500 mg total) by mouth 2 (two) times daily. 11/18/23   Gunnar Bulla, MD  QUEtiapine (SEROQUEL) 50 MG tablet Take 1 tablet (50 mg total) by mouth 2 (two) times daily. 12/25/23   Lewie Chamber, MD  famotidine (PEPCID) 20 MG tablet Take 1 tablet (20 mg total) by mouth 2 (two) times daily. Patient not taking: Reported on 06/01/2019 05/14/19 06/02/19  Benjiman Core, MD      Allergies    Tegretol [carbamazepine] and Caffeine    Review of Systems   Review of Systems  Gastrointestinal:  Positive for abdominal pain.    Physical Exam Updated Vital Signs BP 114/80 (BP Location: Right Arm)   Pulse 80   Temp 98.6 F (37 C) (Oral)   Resp 16   SpO2 100%   Physical Exam Vitals and nursing note reviewed.  Constitutional:      General: He is not in acute distress.    Comments: Uncomfortable 38 year old male no acute distress.  HENT:     Head: Normocephalic and atraumatic.     Nose: Nose normal.  Eyes:     General: No scleral icterus. Cardiovascular:     Rate and Rhythm: Normal rate and regular rhythm.     Pulses: Normal pulses.     Heart sounds: Normal heart sounds.  Pulmonary:     Effort: Pulmonary effort is normal. No respiratory distress.     Breath sounds: No wheezing.  Abdominal:     Palpations: Abdomen is soft.     Tenderness: There is generalized abdominal tenderness and tenderness in the epigastric area.     Comments: Abdomen is soft, there is tenderness in epigastrium and umbilical region.  No rebound or guarding.  Patient is uncomfortable with palpation of abdomen.  Musculoskeletal:     Cervical back: Normal range of motion.     Right lower leg: No edema.     Left lower leg: No edema.  Skin:    General: Skin is warm and dry.     Capillary Refill: Capillary refill takes less than 2 seconds.  Neurological:     Mental Status: He is alert. Mental status is at baseline.  Psychiatric:  Mood and Affect: Mood normal.        Behavior: Behavior normal.     ED Results / Procedures / Treatments   Labs (all labs ordered are listed, but only abnormal results are displayed) Labs Reviewed  COMPREHENSIVE METABOLIC PANEL - Abnormal; Notable for the following components:      Result Value   Glucose, Bld 106 (*)    Total Protein 8.7 (*)    All other components within normal limits  LIPASE, BLOOD - Abnormal; Notable for the following components:   Lipase 337 (*)    All other components within normal limits  CBC WITH DIFFERENTIAL/PLATELET    EKG None  Radiology No results found.  Procedures Procedures    Medications Ordered in ED Medications  lactated ringers bolus 1,000 mL (0 mLs Intravenous Stopped 12/29/23  0925)  HYDROmorphone (DILAUDID) injection 0.5 mg (0.5 mg Intravenous Given 12/29/23 0703)  metoCLOPramide (REGLAN) injection 10 mg (10 mg Intravenous Given 12/29/23 0702)  diphenhydrAMINE (BENADRYL) capsule 25 mg (25 mg Oral Given 12/29/23 0743)  HYDROmorphone (DILAUDID) injection 0.5 mg (0.5 mg Intravenous Given 12/29/23 0924)  acetaminophen (TYLENOL) tablet 1,000 mg (1,000 mg Oral Given 12/29/23 7846)    ED Course/ Medical Decision Making/ A&P Clinical Course as of 12/29/23 1007  Wed Dec 29, 2023  0656 Abd pain since yesterday. Nausea and vomiting. No fever.  [WF]    Clinical Course User Index [WF] Gailen Shelter, PA                                 Medical Decision Making Amount and/or Complexity of Data Reviewed Labs: ordered.  Risk Prescription drug management.   This patient presents to the ED for concern of abd pain, this involves a number of treatment options, and is a complaint that carries with it a moderate to high risk of complications and morbidity. A differential diagnosis was considered for the patient's symptoms which is discussed below:   The causes of generalized abdominal pain include but are not limited to AAA, mesenteric ischemia, appendicitis, diverticulitis, DKA, gastritis, gastroenteritis, AMI, nephrolithiasis, pancreatitis, peritonitis, adrenal insufficiency,lead poisoning, iron toxicity, intestinal ischemia, constipation, UTI,SBO/LBO, splenic rupture, biliary disease, IBD, IBS, PUD, or hepatitis.   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  He states that this feels exactly like his pancreatitis has in the past.  He states alcohol to elicit this each time.  He denies any chest pain or difficulty  breathing.  No unilateral or bilateral leg swelling.  No fevers chills or blood in stool.     EMR reviewed including pt PMHx, past surgical history and past visits to ER.   See HPI for more details   Lab Tests:  Lipase elevated at 337, CMP unremarkable CBC without leukocytosis or anemia   Imaging Studies:  Last CT scan was done 2/7 with findings consistent with acute pancreatitis.  Possible pseudocyst formation.    Cardiac Monitoring:  The patient was maintained on a cardiac monitor.  I personally viewed and interpreted the cardiac monitored which showed an underlying rhythm of: NSR    Medicines ordered:  I ordered medication including Dilaudid, Reglan, Benadryl, IV hydration, Tylenol, Dilaudid for pain  Reevaluation of the patient after these medicines showed that the patient improved I have reviewed the patients home medicines and have made adjustments as needed   Critical Interventions:     Consults/Attending Physician      Reevaluation:  After the interventions noted above I re-evaluated patient and found that they have :improved   Social Determinants of Health:  The patient's social determinants of health were a factor in the care of this patient    Problem List / ED Course:  Patient is a 38 year old male presenting with acute pancreatitis with history and physical exam consistent with this and elevated lipase.  Patient feels improved after Dilaudid and antiemetics.  Will discharge home with follow-up with primary care. PO challenged successfully.    Dispostion:  After consideration of the diagnostic results and the patients response to treatment, I feel that the patent would benefit from discharge home   Final Clinical Impression(s) / ED Diagnoses Final diagnoses:  Alcohol-induced acute pancreatitis, unspecified complication status  Alcohol use disorder    Rx / DC Orders ED Discharge Orders          Ordered    ondansetron (ZOFRAN-ODT) 4  MG disintegrating tablet  Every 8 hours PRN        12/29/23 0848              Gailen Shelter, PA 12/29/23 1007    [place], [place] K, DO 12/29/23 1236

## 2023-12-29 NOTE — Discharge Instructions (Addendum)
Please follow the eating plan Please do not drink alcohol Follow up with [facility]  Please use Tylenol or ibuprofen for pain.  You may use 600 mg ibuprofen every 6 hours or 1000 mg of Tylenol every 6 hours.  You may choose to alternate between the 2.  This would be most effective.  Not to exceed 4 g of Tylenol within 24 hours.  Not to exceed 3200 mg ibuprofen 24 hours.   Zofran as needed for nausea and vomiting.

## 2023-12-30 ENCOUNTER — Emergency Department (HOSPITAL_COMMUNITY)
Admission: EM | Admit: 2023-12-30 | Discharge: 2023-12-31 | Disposition: A | Discharge status: Home / Self Care | Payer: MEDICAID | Attending: Emergency Medicine | Admitting: Emergency Medicine

## 2023-12-30 ENCOUNTER — Encounter (HOSPITAL_COMMUNITY): Payer: Self-pay | Admitting: Emergency Medicine

## 2023-12-30 DIAGNOSIS — G8929 Other chronic pain: Secondary | ICD-10-CM | POA: Insufficient documentation

## 2023-12-30 DIAGNOSIS — Z21 Asymptomatic human immunodeficiency virus [HIV] infection status: Secondary | ICD-10-CM | POA: Diagnosis not present

## 2023-12-30 DIAGNOSIS — K860 Alcohol-induced chronic pancreatitis: Secondary | ICD-10-CM | POA: Diagnosis not present

## 2023-12-30 DIAGNOSIS — R109 Unspecified abdominal pain: Secondary | ICD-10-CM | POA: Diagnosis present

## 2023-12-30 DIAGNOSIS — Z5900 Homelessness unspecified: Secondary | ICD-10-CM | POA: Diagnosis not present

## 2023-12-30 DIAGNOSIS — K859 Acute pancreatitis without necrosis or infection, unspecified: Secondary | ICD-10-CM | POA: Diagnosis not present

## 2023-12-30 LAB — COMPREHENSIVE METABOLIC PANEL
ALT: 17 U/L (ref 0–44)
AST: 20 U/L (ref 15–41)
Albumin: 4.5 g/dL (ref 3.5–5.0)
Alkaline Phosphatase: 56 U/L (ref 38–126)
Anion gap: 12 (ref 5–15)
BUN: 14 mg/dL (ref 6–20)
CO2: 24 mmol/L (ref 22–32)
Calcium: 9.8 mg/dL (ref 8.9–10.3)
Chloride: 101 mmol/L (ref 98–111)
Creatinine, Ser: 0.59 mg/dL — ABNORMAL LOW (ref 0.61–1.24)
GFR, Estimated: >60 mL/min (ref >60)
Glucose, Bld: 101 mg/dL — ABNORMAL HIGH (ref 70–99)
Potassium: 3.5 mmol/L (ref 3.5–5.1)
Sodium: 137 mmol/L (ref 135–145)
Total Bilirubin: 1.1 mg/dL (ref 0.0–1.2)
Total Protein: 8.5 g/dL — ABNORMAL HIGH (ref 6.5–8.1)

## 2023-12-30 LAB — CBC
HCT: 37.8 % — ABNORMAL LOW (ref 39.0–52.0)
Hemoglobin: 12.3 g/dL — ABNORMAL LOW (ref 13.0–17.0)
MCH: 30.4 pg (ref 26.0–34.0)
MCHC: 32.5 g/dL (ref 30.0–36.0)
MCV: 93.3 fL (ref 80.0–100.0)
Platelets: 274 10*3/uL (ref 150–400)
RBC: 4.05 MIL/uL — ABNORMAL LOW (ref 4.22–5.81)
RDW: 14.9 % (ref 11.5–15.5)
WBC: 5.2 10*3/uL (ref 4.0–10.5)
nRBC: 0 % (ref 0.0–0.2)

## 2023-12-30 LAB — LIPASE, BLOOD: Lipase: 359 U/L — ABNORMAL HIGH (ref 11–51)

## 2023-12-30 MED ORDER — ONDANSETRON 4 MG PO TBDP: 4.0000 mg | ORAL_TABLET | Freq: Once | ORAL | Status: DC | PRN

## 2023-12-30 MED ORDER — KETOROLAC TROMETHAMINE 30 MG/ML IJ SOLN
30.0000 mg | Freq: Once | INTRAMUSCULAR | Status: AC
Start: 2023-12-30 — End: 2023-12-30
  Administered 2023-12-30: 30 mg via INTRAVENOUS
  Filled 2023-12-30: qty 1

## 2023-12-30 MED ORDER — ONDANSETRON HCL 4 MG/2ML IJ SOLN
4.0000 mg | Freq: Once | INTRAMUSCULAR | Status: AC
  Administered 2023-12-30: 4 mg via INTRAVENOUS
  Filled 2023-12-30: qty 2

## 2023-12-30 NOTE — ED Triage Notes (Signed)
Pt homeless, in via [facility] with abdominal pain and emesis, onset 2 hrs ago after eating a greasy taco. Hx of pancreatitis, denies any ETOH use today, VSS en route with EMS

## 2023-12-30 NOTE — ED Provider Notes (Signed)
 Gary [name]   CSN: [id number] Arrival date & time: 12/30/23  2032     History  Chief Complaint  Patient presents with   Abdominal Pain    Gary [name] is a 38 y.o. male.  The history is provided by the patient and medical records.  Abdominal Pain  38 y.o. M with hx of chronic pancreatitis, alcohol abuse, fatty liver, HIV, homelessness, malingering, presenting to the ED for reported abdominal pain.  He states it started 2 hours after eating a greasy taco.  He reports vomiting as well.  No diarrhea.  No fever/chills.  Denies alcohol use today.  Home Medications Prior to Admission medications   Medication Sig Start Date End Date Taking? Authorizing Provider  bictegravir-emtricitabine-tenofovir AF (BIKTARVY) 50-200-25 MG TABS tablet Take 1 tablet by mouth daily. 12/27/23   Lewie Chamber, MD  levETIRAcetam (KEPPRA) 500 MG tablet Take 1 tablet (500 mg total) by mouth 2 (two) times daily. 11/18/23   Gunnar Bulla, MD  ondansetron (ZOFRAN-ODT) 4 MG disintegrating tablet Dissolve 1 tablet (4 mg total) by mouth every 8 (eight) hours as needed for nausea or vomiting. 12/29/23   Gailen Shelter, PA  QUEtiapine (SEROQUEL) 50 MG tablet Take 1 tablet (50 mg total) by mouth 2 (two) times daily. 12/25/23   Lewie Chamber, MD  famotidine (PEPCID) 20 MG tablet Take 1 tablet (20 mg total) by mouth 2 (two) times daily. Patient not taking: Reported on 06/01/2019 05/14/19 06/02/19  Benjiman Core, MD      Allergies    Tegretol [carbamazepine] and Caffeine    Review of Systems   Review of Systems  Gastrointestinal:  Positive for abdominal pain.  All other systems reviewed and are negative.   Physical Exam Updated Vital Signs BP (!) 122/92   Pulse 95   Temp 97.6 F (36.4 C)   Resp 17   Wt 59.1 kg   SpO2 98%   BMI 17.67 kg/m   Physical Exam Vitals and nursing [name].  Constitutional:      Appearance: He is well-developed.      Comments: Disheveled appearing, breath smells of alcohol  HENT:     Head: Normocephalic and atraumatic.  Eyes:     Conjunctiva/sclera: Conjunctivae normal.     Pupils: Pupils are equal, round, and reactive to light.  Cardiovascular:     Rate and Rhythm: Normal rate and regular rhythm.     Heart sounds: Normal heart sounds.  Pulmonary:     Effort: Pulmonary effort is normal.     Breath sounds: Normal breath sounds.  Abdominal:     General: Bowel sounds are normal.     Palpations: Abdomen is soft.     Tenderness: There is no abdominal tenderness. There is no rebound.  Musculoskeletal:        General: Normal range of motion.     Cervical back: Normal range of motion.  Skin:    General: Skin is warm and dry.  Neurological:     Mental Status: He is alert and oriented to person, place, and time.     ED Results / Procedures / Treatments   Labs (all labs ordered are listed, but only abnormal results are displayed) Labs Reviewed  LIPASE, BLOOD - Abnormal; Notable for the following components:      Result Value   Lipase 359 (*)    All other components within normal limits  COMPREHENSIVE METABOLIC PANEL - Abnormal; Notable for the  following components:   Glucose, Bld 101 (*)    Creatinine, Ser 0.59 (*)    Total Protein 8.5 (*)    All other components within normal limits  CBC - Abnormal; Notable for the following components:   RBC 4.05 (*)    Hemoglobin 12.3 (*)    HCT 37.8 (*)    All other components within normal limits  URINALYSIS, ROUTINE W REFLEX MICROSCOPIC    EKG None  Radiology No results found.  Procedures Procedures    Medications Ordered in ED Medications  ondansetron (ZOFRAN-ODT) disintegrating tablet 4 mg (has no administration in time range)  ketorolac (TORADOL) 30 MG/ML injection 30 mg (30 mg Intravenous Given 12/30/23 2337)  ondansetron (ZOFRAN) injection 4 mg (4 mg Intravenous Given 12/30/23 2337)    ED Course/ Medical Decision Making/ A&P                                  Medical Decision Making Amount and/or Complexity of Data Reviewed Labs: ordered. ECG/medicine tests: ordered and independent interpretation performed.  Risk Prescription drug management.   38 year old male here with abdominal pain.  History of alcohol abuse as well as pancreatitis.  He was seen here yesterday for same.  He is afebrile and nontoxic in appearance.  He actually does not have any abdominal tenderness elicited on my exam.  He denies alcohol use but his breath smells of alcohol.  Labs today very similar to yesterday, lipase is 359.  This seems to be about his normal values.  He has no electrolyte derangement.  He does not appear clinically dehydrated.  Patient has longstanding chronic abdominal pain.  Suspect this is likely exacerbated by his ongoing alcohol use.  He is HD stable, no active emesis here in the ED.  He is resting comfortably when not engaged by staff.  He was given toradol and zofran.  He is tolerating oral fluids without issue.  I do not feel he needs hospitalization or repeat imaging at this time.  Just had CT 2 weeks ago which again showed findings of chronic pancreatitis.  Stable for discharge.  He was strongly encouraged to refrain from fatty/greasy foods as well as alcohol as this is likely contributing to his ongoing issues.  He can follow-up with his doctor.  Return here for new concerns.  Final Clinical Impression(s) / ED Diagnoses Final diagnoses:  Alcohol-induced chronic pancreatitis [facility])    Rx / DC Orders ED Discharge Orders     None         Garlon Hatchet, PA-C 12/31/23 0042    Royanne Foots, DO 01/02/24 1205

## 2023-12-30 NOTE — Discharge Instructions (Signed)
Can continue using zofran as needed for nausea. Avoid alcohol and fatty/greasy foods as this will worsen your symptoms. You can follow-up with your doctor. Return for new concerns.

## 2023-12-31 ENCOUNTER — Other Ambulatory Visit: Payer: Self-pay

## 2023-12-31 ENCOUNTER — Encounter (HOSPITAL_COMMUNITY): Payer: Self-pay | Admitting: Emergency Medicine

## 2023-12-31 ENCOUNTER — Emergency Department (HOSPITAL_COMMUNITY)
Admission: EM | Admit: 2023-12-31 | Discharge: 2023-12-31 | Disposition: A | Discharge status: Home / Self Care | Payer: MEDICAID | Source: Home / Self Care | Attending: Emergency Medicine | Admitting: Emergency Medicine

## 2023-12-31 ENCOUNTER — Encounter (HOSPITAL_COMMUNITY): Payer: Self-pay

## 2023-12-31 ENCOUNTER — Emergency Department (HOSPITAL_COMMUNITY)
Admission: EM | Admit: 2023-12-31 | Discharge: 2023-12-31 | Disposition: A | Discharge status: Home / Self Care | Payer: MEDICAID | Source: Home / Self Care | Attending: Student | Admitting: Student

## 2023-12-31 DIAGNOSIS — Z21 Asymptomatic human immunodeficiency virus [HIV] infection status: Secondary | ICD-10-CM | POA: Insufficient documentation

## 2023-12-31 DIAGNOSIS — R109 Unspecified abdominal pain: Secondary | ICD-10-CM | POA: Insufficient documentation

## 2023-12-31 DIAGNOSIS — K859 Acute pancreatitis without necrosis or infection, unspecified: Secondary | ICD-10-CM | POA: Insufficient documentation

## 2023-12-31 DIAGNOSIS — G8929 Other chronic pain: Secondary | ICD-10-CM | POA: Insufficient documentation

## 2023-12-31 DIAGNOSIS — F1020 Alcohol dependence, uncomplicated: Secondary | ICD-10-CM

## 2023-12-31 LAB — COMPREHENSIVE METABOLIC PANEL
ALT: 15 U/L (ref 0–44)
AST: 26 U/L (ref 15–41)
Albumin: 4.2 g/dL (ref 3.5–5.0)
Alkaline Phosphatase: 47 U/L (ref 38–126)
Anion gap: 13 (ref 5–15)
BUN: 14 mg/dL (ref 6–20)
CO2: 21 mmol/L — ABNORMAL LOW (ref 22–32)
Calcium: 9.2 mg/dL (ref 8.9–10.3)
Chloride: 100 mmol/L (ref 98–111)
Creatinine, Ser: 0.87 mg/dL (ref 0.61–1.24)
GFR, Estimated: >60 mL/min (ref >60)
Glucose, Bld: 91 mg/dL (ref 70–99)
Potassium: 3.7 mmol/L (ref 3.5–5.1)
Sodium: 134 mmol/L — ABNORMAL LOW (ref 135–145)
Total Bilirubin: 1.2 mg/dL (ref 0.0–1.2)
Total Protein: 7.4 g/dL (ref 6.5–8.1)

## 2023-12-31 LAB — CBC WITH DIFFERENTIAL/PLATELET
Abs Immature Granulocytes: 0.02 10*3/uL (ref 0.00–0.07)
Basophils Absolute: 0.1 10*3/uL (ref 0.0–0.1)
Basophils Relative: 1 %
Eosinophils Absolute: 0 10*3/uL (ref 0.0–0.5)
Eosinophils Relative: 1 %
HCT: 34 % — ABNORMAL LOW (ref 39.0–52.0)
Hemoglobin: 11.5 g/dL — ABNORMAL LOW (ref 13.0–17.0)
Immature Granulocytes: 1 %
Lymphocytes Relative: 25 %
Lymphs Abs: 1.1 10*3/uL (ref 0.7–4.0)
MCH: 31 pg (ref 26.0–34.0)
MCHC: 33.8 g/dL (ref 30.0–36.0)
MCV: 91.6 fL (ref 80.0–100.0)
Monocytes Absolute: 0.5 10*3/uL (ref 0.1–1.0)
Monocytes Relative: 11 %
Neutro Abs: 2.7 10*3/uL (ref 1.7–7.7)
Neutrophils Relative %: 61 %
Platelets: 221 10*3/uL (ref 150–400)
RBC: 3.71 MIL/uL — ABNORMAL LOW (ref 4.22–5.81)
RDW: 15 % (ref 11.5–15.5)
WBC: 4.4 10*3/uL (ref 4.0–10.5)
nRBC: 0 % (ref 0.0–0.2)

## 2023-12-31 LAB — LIPASE, BLOOD: Lipase: 297 U/L — ABNORMAL HIGH (ref 11–51)

## 2023-12-31 MED ORDER — ONDANSETRON 4 MG PO TBDP
4.0000 mg | ORAL_TABLET | Freq: Once | ORAL | Status: AC
  Administered 2023-12-31: 4 mg via ORAL
  Filled 2023-12-31: qty 1

## 2023-12-31 MED ORDER — KETOROLAC TROMETHAMINE 15 MG/ML IJ SOLN
15.0000 mg | Freq: Once | INTRAMUSCULAR | Status: AC
  Administered 2023-12-31: 15 mg via INTRAVENOUS
  Filled 2023-12-31: qty 1

## 2023-12-31 NOTE — ED Provider Notes (Signed)
[place] EMERGENCY DEPARTMENT AT [facility] Provider Note   CSN: [id number] Arrival date & time: 12/31/23  0126     History  Chief Complaint  Patient presents with   Abdominal Pain    Gary [name] is a 38 y.o. male.  The history is provided by the patient and medical records.  Abdominal Pain  38 year old male with chronic alcoholic pancreatitis, presenting to the ED for abdominal pain.  I personally just evaluated patient and discharged him, he returned to the ED within 1 hour.  He cannot tell me anything different about his symptoms.  He just continues saying "no, I can't make it".  He was picked up by EMS, no vomiting with them or upon return to the ED.  Home Medications Prior to Admission medications   Medication Sig Start Date End Date Taking? Authorizing Provider  bictegravir-emtricitabine-tenofovir AF (BIKTARVY) 50-200-25 MG TABS tablet Take 1 tablet by mouth daily. 12/27/23   Lewie Chamber, MD  levETIRAcetam (KEPPRA) 500 MG tablet Take 1 tablet (500 mg total) by mouth 2 (two) times daily. 11/18/23   Gunnar Bulla, MD  ondansetron (ZOFRAN-ODT) 4 MG disintegrating tablet Dissolve 1 tablet (4 mg total) by mouth every 8 (eight) hours as needed for nausea or vomiting. 12/29/23   Gailen Shelter, PA  QUEtiapine (SEROQUEL) 50 MG tablet Take 1 tablet (50 mg total) by mouth 2 (two) times daily. 12/25/23   Lewie Chamber, MD  famotidine (PEPCID) 20 MG tablet Take 1 tablet (20 mg total) by mouth 2 (two) times daily. Patient not taking: Reported on 06/01/2019 05/14/19 06/02/19  Benjiman Core, MD      Allergies    Tegretol [carbamazepine] and Caffeine    Review of Systems   Review of Systems  Gastrointestinal:  Positive for abdominal pain.  All other systems reviewed and are negative.   Physical Exam Updated Vital Signs BP 119/66   Pulse (!) 109   Temp 98.9 F (37.2 C) (Oral)   Resp 18   Wt 59.1 kg   SpO2 100%   BMI 17.67 kg/m   Physical Exam Vitals and  nursing note reviewed.  Constitutional:      Appearance: He is well-developed.     Comments: Observed trying to stick fingers in mouth while in wheelchair to induce vomiting, no active emesis produced  HENT:     Head: Normocephalic and atraumatic.  Eyes:     Conjunctiva/sclera: Conjunctivae normal.     Pupils: Pupils are equal, round, and reactive to light.  Cardiovascular:     Rate and Rhythm: Normal rate and regular rhythm.     Heart sounds: Normal heart sounds.  Pulmonary:     Effort: Pulmonary effort is normal.     Breath sounds: Normal breath sounds.  Abdominal:     General: Bowel sounds are normal.     Palpations: Abdomen is soft.     Tenderness: There is no abdominal tenderness. There is no rebound.  Musculoskeletal:        General: Normal range of motion.     Cervical back: Normal range of motion.  Skin:    General: Skin is warm and dry.  Neurological:     Mental Status: He is alert and oriented to person, place, and time.     ED Results / Procedures / Treatments   Labs (all labs ordered are listed, but only abnormal results are displayed) Labs Reviewed - No data to display  EKG None  Radiology No results found.  Procedures Procedures    Medications Ordered in ED Medications - No data to display  ED Course/ Medical Decision Making/ A&P                                 Medical Decision Making  38 y.o. M here for 2nd visit tonight for abdominal pain.  This is a chronic issue.  His exam is unchanged from prior evaluation by myself just a few hours ago.  His labs appeared near his baseline.  I suspect he is here desiring narcotics.  He is trying to induce vomiting here which he has done in the past.  He does have drug seeking tendencies and manipulative behaviors.  Patient was advised he would not be receiving any narcotics.  I do not believe he needs repeat labs or imaging at this time.  Stable for discharge.  Final Clinical Impression(s) / ED  Diagnoses Final diagnoses:  Chronic abdominal pain    Rx / DC Orders ED Discharge Orders     None         Garlon Hatchet, PA-C 12/31/23 0200    Glendora Score, MD 12/31/23 [phone]

## 2023-12-31 NOTE — ED Triage Notes (Signed)
Pt returns to ED via [facility] with continued abdominal pain. Pt just seen and released a few hrs ago. EMS states VSS during transport, no emesis reported

## 2023-12-31 NOTE — ED Provider Notes (Signed)
 [place] EMERGENCY DEPARTMENT AT [facility] Provider Note   CSN: [id number] Arrival date & time: 12/31/23  7564     History  Chief Complaint  Patient presents with   Abdominal Pain    Gary [name] is a 38 y.o. male past medical history significant for chronic pancreatitis, alcohol abuse, fatty liver, HIV, homelessness, and malingering presents today for abdominal pain.  Patient states it started after eating a greasy taco.  Patient endorses nausea and vomiting.  Patient denies diarrhea, fever, chills, shortness of breath, alcohol use or chest pain.  Of note, this is the third time in less than 12 hours that the patient has been seen for the same complaint.   Abdominal Pain Associated symptoms: nausea and vomiting        Home Medications Prior to Admission medications   Medication Sig Start Date End Date Taking? Authorizing Provider  bictegravir-emtricitabine-tenofovir AF (BIKTARVY) 50-200-25 MG TABS tablet Take 1 tablet by mouth daily. 12/27/23  Yes Lewie Chamber, MD  levETIRAcetam (KEPPRA) 500 MG tablet Take 1 tablet (500 mg total) by mouth 2 (two) times daily. 11/18/23  Yes Gunnar Bulla, MD  QUEtiapine (SEROQUEL) 50 MG tablet Take 1 tablet (50 mg total) by mouth 2 (two) times daily. 12/25/23  Yes Lewie Chamber, MD  ondansetron (ZOFRAN-ODT) 4 MG disintegrating tablet Dissolve 1 tablet (4 mg total) by mouth every 8 (eight) hours as needed for nausea or vomiting. Patient not taking: Reported on 12/31/2023 12/29/23   Gailen Shelter, PA  famotidine (PEPCID) 20 MG tablet Take 1 tablet (20 mg total) by mouth 2 (two) times daily. Patient not taking: Reported on 06/01/2019 05/14/19 06/02/19  Benjiman Core, MD      Allergies    Tegretol [carbamazepine] and Caffeine    Review of Systems   Review of Systems  Gastrointestinal:  Positive for abdominal pain, nausea and vomiting.    Physical Exam Updated Vital Signs BP 106/74   Pulse 87   Temp 97.8 F (36.6 C)   Resp  16   Ht 6' (1.829 m)   Wt 59.1 kg   SpO2 100%   BMI 17.67 kg/m  Physical Exam Vitals and nursing note reviewed.  Constitutional:      General: He is not in acute distress.    Appearance: He is well-developed. He is not ill-appearing, toxic-appearing or diaphoretic.     Comments: Unkempt  HENT:     Head: Normocephalic and atraumatic.  Eyes:     Conjunctiva/sclera: Conjunctivae normal.  Cardiovascular:     Rate and Rhythm: Normal rate and regular rhythm.     Heart sounds: Normal heart sounds. No murmur heard. Pulmonary:     Effort: Pulmonary effort is normal. No respiratory distress.     Breath sounds: Normal breath sounds.  Abdominal:     General: Bowel sounds are normal.     Palpations: Abdomen is soft.     Tenderness: There is no abdominal tenderness.     Comments: Patient reports pain to even light touching of the abdomen all over  Musculoskeletal:        General: No swelling.     Cervical back: Neck supple.  Skin:    General: Skin is warm and dry.     Capillary Refill: Capillary refill takes less than 2 seconds.  Neurological:     General: No focal deficit present.     Mental Status: He is alert.  Psychiatric:        Mood and  Affect: Mood normal.     ED Results / Procedures / Treatments   Labs (all labs ordered are listed, but only abnormal results are displayed) Labs Reviewed  LIPASE, BLOOD - Abnormal; Notable for the following components:      Result Value   Lipase 297 (*)    All other components within normal limits  COMPREHENSIVE METABOLIC PANEL - Abnormal; Notable for the following components:   Sodium 134 (*)    CO2 21 (*)    All other components within normal limits  CBC WITH DIFFERENTIAL/PLATELET - Abnormal; Notable for the following components:   RBC 3.71 (*)    Hemoglobin 11.5 (*)    HCT 34.0 (*)    All other components within normal limits    EKG None  Radiology No results found.  Procedures Procedures    Medications Ordered in  ED Medications  ondansetron (ZOFRAN-ODT) disintegrating tablet 4 mg (4 mg Oral Given 12/31/23 1005)  ketorolac (TORADOL) 15 MG/ML injection 15 mg (15 mg Intravenous Given 12/31/23 1006)    ED Course/ Medical Decision Making/ A&P                                 Medical Decision Making Amount and/or Complexity of Data Reviewed Labs: ordered.  Risk Prescription drug management.   This patient presents to the ED with chief complaint(s) of abdominal pain with pertinent past medical history of alcohol abuse and chronic pancreatitis which further complicates the presenting complaint. The complaint involves an extensive differential diagnosis and also carries with it a high risk of complications and morbidity.    The differential diagnosis includes pancreatitis, electrolyte abnormality, hepatitis, diverticulitis, appendicitis,  Additional history obtained: Records reviewed previous admission documents  ED Course and Reassessment: Patient given Zofran and Toradol for nausea and pain  Independent labs interpretation:  The following labs were independently interpreted:  CBC: Anemia at 11.5 which is chronic per historical values CMP: No notable findings Lipase: 297  Independent visualization of imaging: CT deferred as patient has had CT approximately 2 weeks ago on 2/7 which again showed findings consistent with sequela of pancreatitis with peripancreatic collections/pseudocyst.  Consultation: - Consulted or discussed management/test interpretation w/ external professional: None  Consideration for admission or further workup: Considered for mission further workup however patient's vital signs, physical exam, and labs are reassuring.  Patient is afebrile and nontoxic in appearance.  Patient has longstanding chronic abdominal pain.  Patient resting comfortably when staff is not in room.  Patient able to tolerate oral intake without issue.  Patient should follow-up with primary care if his  pain persists for further evaluation and treatment.         Final Clinical Impression(s) / ED Diagnoses Final diagnoses:  None    Rx / DC Orders ED Discharge Orders     None         Gretta Began 12/31/23 1143    Benjiman Core, MD 01/01/24 [phone]

## 2023-12-31 NOTE — ED Triage Notes (Signed)
Pt. Stated, My pancreas is acting up with bloating and cramping since yesterday

## 2023-12-31 NOTE — Discharge Instructions (Signed)
They were seen for chronic pancreatitis.  You should refrain from eating greasy foods or ingesting alcohol as to not exacerbate your symptoms.  Thank you for letting us treat you today. After reviewing your labs and forming a physical exam, I feel you are safe to go home. Please follow up with your PCP in the next several days and provide them with your records from this visit. Return to the Emergency Room if pain becomes severe or symptoms worsen.

## 2023-12-31 NOTE — ED Notes (Signed)
Pt c/o N/V/D started yesterday

## 2024-01-09 NOTE — Progress Notes (Deleted)
 Subjective:  Chief complaint: follow-up for HIV disease on medications   Patient ID: Gary [name], male    DOB: 11-04-1986, 38 y.o.   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   Anxiety    Bipolar 2 disorder (HCC)    HIV (human immunodeficiency virus infection) (HCC)    Pancreatitis    PTSD (post-traumatic stress disorder)    Schizophrenia (HCC)    Seizures (HCC)    Subdural hematoma (HCC)     Past Surgical History:  Procedure Laterality Date   BIOPSY  04/19/2022   Procedure: BIOPSY;  Surgeon: Lemar Lofty., MD;  Location: [facility] ENDOSCOPY;  Service: Gastroenterology;;   ENTEROSCOPY N/A 04/19/2022   Procedure: ENTEROSCOPY;  Surgeon: Lemar Lofty., MD;  Location: [facility] ENDOSCOPY;  Service: Gastroenterology;  Laterality: N/A;   INCISION AND DRAINAGE PERIRECTAL ABSCESS N/A 09/24/2016   Procedure: IRRIGATION AND DEBRIDEMENT PERIRECTAL ABSCESS;  Surgeon: Ricarda Frame, MD;  Location: [facility] ORS;  Service: General;  Laterality: N/A;   none      Family History  Problem Relation Age of Onset   Alcohol abuse Mother    Alcohol abuse Father    Colon cancer Other    Other Other    Cancer Other       Social History   Socioeconomic History   Marital status: Single    Spouse name: Not on file   Number of children: Not on file   Years of education: Not on file   Highest education level: Not on file  Occupational History   Occupation: unemployed  Tobacco Use   Smoking status: Every Day    Current packs/day: 0.50    Average packs/day: 0.5 packs/day for 21.2 years (10.6 ttl pk-yrs)    Types: Cigarettes    Start date: 2004   Smokeless tobacco: Never   Tobacco comments:    unable to smoke while incarcerated 6+ months 02/13/20  Vaping Use   Vaping status: Never Used  Substance and Sexual Activity   Alcohol use: Not Currently    Alcohol/week: 105.0 standard drinks of alcohol     Types: 105 Cans of beer per week    Comment: drinks every day, all day, "whatever I can get my hands on". "Half a gallon of vodka" 12/16   Drug use: Not Currently    Types: Methamphetamines, Cocaine    Comment: last used 10/22/2023   Sexual activity: Yes    Partners: Male, Male    Comment: declined condoms  03/2021  Other Topics Concern   Not on file  Social History Narrative   "Currently living on the streets"   Independent at baseline   Occasionally goes to the [facility]   Social Drivers of Health   Financial Resource Strain: Patient Declined (10/11/2023)   Overall Financial Resource Strain (CARDIA)    Difficulty of Paying Living Expenses: Patient declined  Food Insecurity: Food Insecurity Present (12/17/2023)   Hunger Vital Sign    Worried About Running Out of Food in the Last Year: Often true    Ran Out of Food in the Last Year: Often true  Transportation Needs: Unmet Transportation Needs (12/17/2023)   PRAPARE - Transportation    Lack of Transportation (Medical): Yes    Lack of Transportation (Non-Medical): Yes  Physical Activity: Not on file  Stress: No Stress Concern Present (06/30/2022)   Received from [facility]   [place]  [employer] - Occupational Stress Questionnaire    Feeling of Stress : Not at all  Recent Concern: Stress - Stress Concern Present (06/25/2022)   Received from [facility] - Occupational Stress Questionnaire    Feeling of Stress : Rather much  Social Connections: Socially Isolated (12/17/2023)   Social Connection and Isolation Panel [NHANES]    Frequency of Communication with Friends and Family: Never    Frequency of Social Gatherings with Friends and Family: Never    Attends Religious Services: Never    Database administrator or Organizations: No    Attends Engineer, structural: Never    Marital Status: Never married    Allergies  Allergen Reactions   Tegretol  [Carbamazepine] Other (See Comments)    Vertigo and paralysis   Caffeine Palpitations     Current Outpatient Medications:    bictegravir-emtricitabine-tenofovir AF (BIKTARVY) 50-200-25 MG TABS tablet, Take 1 tablet by mouth daily., Disp: 90 tablet, Rfl: 0   levETIRAcetam (KEPPRA) 500 MG tablet, Take 1 tablet (500 mg total) by mouth 2 (two) times daily., Disp: 60 tablet, Rfl: 0   ondansetron (ZOFRAN-ODT) 4 MG disintegrating tablet, Dissolve 1 tablet (4 mg total) by mouth every 8 (eight) hours as needed for nausea or vomiting. (Patient not taking: Reported on 12/31/2023), Disp: 20 tablet, Rfl: 0   QUEtiapine (SEROQUEL) 50 MG tablet, Take 1 tablet (50 mg total) by mouth 2 (two) times daily., Disp: 60 tablet, Rfl: 3   Review of Systems     Objective:   Physical Exam        Assessment & Plan:

## 2024-01-10 ENCOUNTER — Inpatient Hospital Stay: Payer: MEDICAID | Admitting: Infectious Disease

## 2024-01-10 ENCOUNTER — Other Ambulatory Visit: Payer: Self-pay

## 2024-01-10 ENCOUNTER — Emergency Department (HOSPITAL_COMMUNITY)
Admission: EM | Admit: 2024-01-10 | Discharge: 2024-01-11 | Disposition: A | Discharge status: Home / Self Care | Payer: MEDICAID | Attending: Emergency Medicine | Admitting: Emergency Medicine

## 2024-01-10 ENCOUNTER — Encounter (HOSPITAL_COMMUNITY): Payer: Self-pay | Admitting: Emergency Medicine

## 2024-01-10 DIAGNOSIS — K860 Alcohol-induced chronic pancreatitis: Secondary | ICD-10-CM | POA: Diagnosis not present

## 2024-01-10 DIAGNOSIS — Z21 Asymptomatic human immunodeficiency virus [HIV] infection status: Secondary | ICD-10-CM | POA: Insufficient documentation

## 2024-01-10 DIAGNOSIS — B20 Human immunodeficiency virus [HIV] disease: Secondary | ICD-10-CM

## 2024-01-10 DIAGNOSIS — Z1212 Encounter for screening for malignant neoplasm of rectum: Secondary | ICD-10-CM

## 2024-01-10 DIAGNOSIS — R109 Unspecified abdominal pain: Secondary | ICD-10-CM | POA: Diagnosis present

## 2024-01-10 DIAGNOSIS — Z5900 Homelessness unspecified: Secondary | ICD-10-CM

## 2024-01-10 DIAGNOSIS — F1014 Alcohol abuse with alcohol-induced mood disorder: Secondary | ICD-10-CM

## 2024-01-10 LAB — COMPREHENSIVE METABOLIC PANEL
ALT: 12 U/L (ref 0–44)
AST: 14 U/L — ABNORMAL LOW (ref 15–41)
Albumin: 4.1 g/dL (ref 3.5–5.0)
Alkaline Phosphatase: 46 U/L (ref 38–126)
Anion gap: 10 (ref 5–15)
BUN: 10 mg/dL (ref 6–20)
CO2: 23 mmol/L (ref 22–32)
Calcium: 9.9 mg/dL (ref 8.9–10.3)
Chloride: 106 mmol/L (ref 98–111)
Creatinine, Ser: 0.63 mg/dL (ref 0.61–1.24)
GFR, Estimated: >60 mL/min (ref >60)
Glucose, Bld: 115 mg/dL — ABNORMAL HIGH (ref 70–99)
Potassium: 3.5 mmol/L (ref 3.5–5.1)
Sodium: 139 mmol/L (ref 135–145)
Total Bilirubin: 0.6 mg/dL (ref 0.0–1.2)
Total Protein: 7.4 g/dL (ref 6.5–8.1)

## 2024-01-10 LAB — CBC WITH DIFFERENTIAL/PLATELET
Abs Immature Granulocytes: 0.01 10*3/uL (ref 0.00–0.07)
Basophils Absolute: 0 10*3/uL (ref 0.0–0.1)
Basophils Relative: 0 %
Eosinophils Absolute: 0.1 10*3/uL (ref 0.0–0.5)
Eosinophils Relative: 2 %
HCT: 37.5 % — ABNORMAL LOW (ref 39.0–52.0)
Hemoglobin: 12.7 g/dL — ABNORMAL LOW (ref 13.0–17.0)
Immature Granulocytes: 0 %
Lymphocytes Relative: 20 %
Lymphs Abs: 1.2 10*3/uL (ref 0.7–4.0)
MCH: 30.9 pg (ref 26.0–34.0)
MCHC: 33.9 g/dL (ref 30.0–36.0)
MCV: 91.2 fL (ref 80.0–100.0)
Monocytes Absolute: 0.4 10*3/uL (ref 0.1–1.0)
Monocytes Relative: 7 %
Neutro Abs: 4.1 10*3/uL (ref 1.7–7.7)
Neutrophils Relative %: 71 %
Platelets: 239 10*3/uL (ref 150–400)
RBC: 4.11 MIL/uL — ABNORMAL LOW (ref 4.22–5.81)
RDW: 14.6 % (ref 11.5–15.5)
WBC: 5.8 10*3/uL (ref 4.0–10.5)
nRBC: 0 % (ref 0.0–0.2)

## 2024-01-10 LAB — LIPASE, BLOOD: Lipase: 380 U/L — ABNORMAL HIGH (ref 11–51)

## 2024-01-10 NOTE — ED Triage Notes (Addendum)
 Pt BIB EMS with c/o abdominal pain x 1 week. States last etoh was 12/28/23  117/80 70s HR Cbg 113  18g RAC

## 2024-01-10 NOTE — ED Provider Triage Note (Signed)
 Emergency Medicine Provider Triage Evaluation Note  Gary [name] , a 38 y.o. male  was evaluated in triage.  Pt with known alcohol induced pancreatitis presents with epigastric pain. States he ate too much greasy food.  Review of Systems  Positive:  Negative:   Physical Exam  BP 99/73 (BP Location: Left Arm)   Pulse 81   Temp 98.2 F (36.8 C)   Resp 16   Ht 6' (1.829 m)   Wt 59 kg   SpO2 100%   BMI 17.64 kg/m  Gen:   Awake, no distress   Resp:  Normal effort  MSK:   Moves extremities without difficulty  Other:  Abd with epigastric tenderness  Medical Decision Making  Medically screening exam initiated at 8:37 PM.  Appropriate orders placed.  KALETH KOY was informed that the remainder of the evaluation will be completed by another provider, this initial triage assessment does not replace that evaluation, and the importance of remaining in the ED until their evaluation is complete.     Halford Decamp, PA-C 01/10/24 2039

## 2024-01-11 LAB — URINALYSIS, ROUTINE W REFLEX MICROSCOPIC
Bilirubin Urine: NEGATIVE
Glucose, UA: NEGATIVE mg/dL
Hgb urine dipstick: NEGATIVE
Ketones, ur: 5 mg/dL — AB
Leukocytes,Ua: NEGATIVE
Nitrite: NEGATIVE
Protein, ur: NEGATIVE mg/dL
Specific Gravity, Urine: 1.028 (ref 1.005–1.030)
pH: 5 (ref 5.0–8.0)

## 2024-01-11 MED ORDER — KETOROLAC TROMETHAMINE 15 MG/ML IJ SOLN: 15.0000 mg | Freq: Once | INTRAMUSCULAR | Status: DC

## 2024-01-11 MED ORDER — KETOROLAC TROMETHAMINE 15 MG/ML IJ SOLN
15.0000 mg | Freq: Once | INTRAMUSCULAR | Status: AC
  Administered 2024-01-11: 15 mg via INTRAVENOUS
  Filled 2024-01-11: qty 1

## 2024-01-11 MED ORDER — HYDROMORPHONE HCL 1 MG/ML IJ SOLN
0.5000 mg | Freq: Once | INTRAMUSCULAR | Status: AC
  Administered 2024-01-11: 0.5 mg via INTRAVENOUS
  Filled 2024-01-11: qty 1

## 2024-01-11 MED ORDER — ONDANSETRON 4 MG PO TBDP
4.0000 mg | ORAL_TABLET | Freq: Once | ORAL | Status: AC
  Administered 2024-01-11: 4 mg via ORAL
  Filled 2024-01-11: qty 1

## 2024-01-11 MED ORDER — HYDROCODONE-ACETAMINOPHEN 5-325 MG PO TABS
1.0000 | ORAL_TABLET | Freq: Once | ORAL | Status: AC
  Administered 2024-01-11: 1 via ORAL
  Filled 2024-01-11: qty 1

## 2024-01-11 MED ORDER — SODIUM CHLORIDE 0.9 % IV BOLUS
1000.0000 mL | Freq: Once | INTRAVENOUS | Status: AC
  Administered 2024-01-11: 1000 mL via INTRAVENOUS

## 2024-01-11 NOTE — ED Provider Notes (Signed)
 [place] EMERGENCY DEPARTMENT AT [facility] Provider Note   CSN: [id number] Arrival date & time: 01/10/24  2013     History  Chief Complaint  Patient presents with   Abdominal Pain    Gary [name] is a 38 y.o. male.  38 year old male with past medical history significant for alcohol induced pancreatitis who has had 33 presentations to the ED in the past 6 months mostly for similar complaints presents today for concern of abdominal pain which is similar to his episode of pancreatitis.  He states this episode started yesterday but got worse today.  He states he has stopped drinking alcohol.  His last drink was well over a week ago.  He states he could not control his pain at home.  The history is provided by the patient. No language interpreter was used.       Home Medications Prior to Admission medications   Medication Sig Start Date End Date Taking? Authorizing Provider  bictegravir-emtricitabine-tenofovir AF (BIKTARVY) 50-200-25 MG TABS tablet Take 1 tablet by mouth daily. 12/27/23   Lewie Chamber, MD  levETIRAcetam (KEPPRA) 500 MG tablet Take 1 tablet (500 mg total) by mouth 2 (two) times daily. 11/18/23   Gunnar Bulla, MD  ondansetron (ZOFRAN-ODT) 4 MG disintegrating tablet Dissolve 1 tablet (4 mg total) by mouth every 8 (eight) hours as needed for nausea or vomiting. Patient not taking: Reported on 12/31/2023 12/29/23   Gailen Shelter, PA  QUEtiapine (SEROQUEL) 50 MG tablet Take 1 tablet (50 mg total) by mouth 2 (two) times daily. 12/25/23   Lewie Chamber, MD  famotidine (PEPCID) 20 MG tablet Take 1 tablet (20 mg total) by mouth 2 (two) times daily. Patient not taking: Reported on 06/01/2019 05/14/19 06/02/19  Benjiman Core, MD      Allergies    Tegretol [carbamazepine] and Caffeine    Review of Systems   Review of Systems  Constitutional:  Negative for chills and fever.  Gastrointestinal:  Positive for abdominal pain.  All other systems reviewed and are  negative.   Physical Exam Updated Vital Signs BP 106/82 (BP Location: Left Arm)   Pulse 67   Temp 98.2 F (36.8 C) (Oral)   Resp 20   Ht 6' (1.829 m)   Wt 59 kg   SpO2 100%   BMI 17.64 kg/m  Physical Exam Vitals and nursing note reviewed.  Constitutional:      General: He is not in acute distress.    Appearance: Normal appearance. He is not ill-appearing.  HENT:     Head: Normocephalic and atraumatic.     Nose: Nose normal.  Eyes:     General: No scleral icterus.    Extraocular Movements: Extraocular movements intact.     Conjunctiva/sclera: Conjunctivae normal.  Cardiovascular:     Rate and Rhythm: Normal rate and regular rhythm.  Pulmonary:     Effort: Pulmonary effort is normal. No respiratory distress.     Breath sounds: Normal breath sounds. No wheezing or rales.  Abdominal:     General: There is no distension.     Tenderness: There is abdominal tenderness.  Musculoskeletal:        General: Normal range of motion.     Cervical back: Normal range of motion.  Skin:    General: Skin is warm and dry.  Neurological:     General: No focal deficit present.     Mental Status: He is alert. Mental status is at baseline.  ED Results / Procedures / Treatments   Labs (all labs ordered are listed, but only abnormal results are displayed) Labs Reviewed  CBC WITH DIFFERENTIAL/PLATELET - Abnormal; Notable for the following components:      Result Value   RBC 4.11 (*)    Hemoglobin 12.7 (*)    HCT 37.5 (*)    All other components within normal limits  COMPREHENSIVE METABOLIC PANEL - Abnormal; Notable for the following components:   Glucose, Bld 115 (*)    AST 14 (*)    All other components within normal limits  URINALYSIS, ROUTINE W REFLEX MICROSCOPIC - Abnormal; Notable for the following components:   Ketones, ur 5 (*)    All other components within normal limits  LIPASE, BLOOD - Abnormal; Notable for the following components:   Lipase 380 (*)    All other  components within normal limits    EKG None  Radiology No results found.  Procedures Procedures    Medications Ordered in ED Medications  HYDROcodone-acetaminophen (NORCO/VICODIN) 5-325 MG per tablet 1 tablet (1 tablet Oral Given 01/11/24 0809)  ondansetron (ZOFRAN-ODT) disintegrating tablet 4 mg (4 mg Oral Given 01/11/24 0809)  sodium chloride 0.9 % bolus 1,000 mL (1,000 mLs Intravenous New Bag/Given 01/11/24 0810)  ketorolac (TORADOL) 15 MG/ML injection 15 mg (15 mg Intravenous Given 01/11/24 0809)    ED Course/ Medical Decision Making/ A&P                                 Medical Decision Making Risk Prescription drug management.   Medical Decision Making / ED Course   This patient presents to the ED for concern of abdominal pain, this involves an extensive number of treatment options, and is a complaint that carries with it a high risk of complications and morbidity.  The differential diagnosis includes pancreatitis, chronic pancreatitis appendicitis, cholecystitis, colitis, diverticulitis, gastroenteritis  MDM: 38 year old male presents today for concern of abdominal pain.  He was most recently seen on 2/21.  He has been seen 33 times in the past 6 months.  He has history of chronic pancreatitis with acute flareups occasionally.  This particular episode started yesterday.  He denies current alcohol use.  Will give fluids, IV Toradol, and p.o. Norco and reevaluate.  Admission considered but will reevaluate after labs and imaging.  CBC shows no leukocytosis.  Hemoglobin around patient's baseline at 12 7.  CMP with glucose of 115 otherwise without acute findings.  LFTs are normal.  Lipase is elevated at 380 which is around his baseline.  UA without evidence of UTI.  Ketones present which could indicate dehydration.  Will give IV fluids.  0.5 mg of Dilaudid given due to continued pain.  On reevaluation he is much better.  He is stable for discharge.  Discharged in stable  condition.  Imaging considered but ultimately deferred due to stable lab work, and multiple CT scans in the past couple months.   Additional history obtained: -Additional history obtained from previous admissions and ED visit -External records from outside source obtained and reviewed including: Chart review including previous notes, labs, imaging, consultation notes   Lab Tests: -I ordered, reviewed, and interpreted labs.   The pertinent results include:   Labs Reviewed  CBC WITH DIFFERENTIAL/PLATELET - Abnormal; Notable for the following components:      Result Value   RBC 4.11 (*)    Hemoglobin 12.7 (*)    HCT 37.5 (*)  All other components within normal limits  COMPREHENSIVE METABOLIC PANEL - Abnormal; Notable for the following components:   Glucose, Bld 115 (*)    AST 14 (*)    All other components within normal limits  URINALYSIS, ROUTINE W REFLEX MICROSCOPIC - Abnormal; Notable for the following components:   Ketones, ur 5 (*)    All other components within normal limits  LIPASE, BLOOD - Abnormal; Notable for the following components:   Lipase 380 (*)    All other components within normal limits      EKG  EKG Interpretation Date/Time:    Ventricular Rate:    PR Interval:    QRS Duration:    QT Interval:    QTC Calculation:   R Axis:      Text Interpretation:         Medicines ordered and prescription drug management: Meds ordered this encounter  Medications   DISCONTD: ketorolac (TORADOL) 15 MG/ML injection 15 mg   HYDROcodone-acetaminophen (NORCO/VICODIN) 5-325 MG per tablet 1 tablet    Refill:  0   ondansetron (ZOFRAN-ODT) disintegrating tablet 4 mg   sodium chloride 0.9 % bolus 1,000 mL   ketorolac (TORADOL) 15 MG/ML injection 15 mg    -I have reviewed the patients home medicines and have made adjustments as needed  Reevaluation: After the interventions noted above, I reevaluated the patient and found that they have :improved  Co morbidities  that complicate the patient evaluation  Past Medical History:  Diagnosis Date   Alcohol abuse    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  Return precaution discussed.  Patient voices understanding and is in agreement with plan.   Final Clinical Impression(s) / ED Diagnoses Final diagnoses:  Alcohol-induced chronic pancreatitis [facility])    Rx / DC Orders ED Discharge Orders     None         Marita Kansas, PA-C 01/11/24 1128    Glyn Ade, MD 01/11/24 1529

## 2024-01-11 NOTE — ED Notes (Signed)
 Pt provided with water and crackers for PO challenge

## 2024-01-11 NOTE — Discharge Instructions (Signed)
 Your symptoms improved after medications in the emergency department.  Follow-up with your primary care provider.  Return for any concerning symptoms.

## 2024-01-12 ENCOUNTER — Other Ambulatory Visit: Payer: Self-pay

## 2024-01-12 ENCOUNTER — Emergency Department (HOSPITAL_COMMUNITY)
Admission: EM | Admit: 2024-01-12 | Discharge: 2024-01-12 | Disposition: A | Discharge status: Home / Self Care | Payer: MEDICAID | Attending: Emergency Medicine | Admitting: Emergency Medicine

## 2024-01-12 ENCOUNTER — Encounter (HOSPITAL_COMMUNITY): Payer: Self-pay

## 2024-01-12 DIAGNOSIS — G8929 Other chronic pain: Secondary | ICD-10-CM | POA: Insufficient documentation

## 2024-01-12 DIAGNOSIS — R109 Unspecified abdominal pain: Secondary | ICD-10-CM | POA: Insufficient documentation

## 2024-01-12 LAB — COMPREHENSIVE METABOLIC PANEL
ALT: 12 U/L (ref 0–44)
AST: 15 U/L (ref 15–41)
Albumin: 3.7 g/dL (ref 3.5–5.0)
Alkaline Phosphatase: 44 U/L (ref 38–126)
Anion gap: 10 (ref 5–15)
BUN: 6 mg/dL (ref 6–20)
CO2: 22 mmol/L (ref 22–32)
Calcium: 9.3 mg/dL (ref 8.9–10.3)
Chloride: 106 mmol/L (ref 98–111)
Creatinine, Ser: 0.55 mg/dL — ABNORMAL LOW (ref 0.61–1.24)
GFR, Estimated: >60 mL/min (ref >60)
Glucose, Bld: 118 mg/dL — ABNORMAL HIGH (ref 70–99)
Potassium: 3.6 mmol/L (ref 3.5–5.1)
Sodium: 138 mmol/L (ref 135–145)
Total Bilirubin: 0.5 mg/dL (ref 0.0–1.2)
Total Protein: 6.5 g/dL (ref 6.5–8.1)

## 2024-01-12 LAB — CBC WITH DIFFERENTIAL/PLATELET
Abs Immature Granulocytes: 0.01 10*3/uL (ref 0.00–0.07)
Basophils Absolute: 0 10*3/uL (ref 0.0–0.1)
Basophils Relative: 1 %
Eosinophils Absolute: 0.1 10*3/uL (ref 0.0–0.5)
Eosinophils Relative: 3 %
HCT: 34 % — ABNORMAL LOW (ref 39.0–52.0)
Hemoglobin: 11.5 g/dL — ABNORMAL LOW (ref 13.0–17.0)
Immature Granulocytes: 0 %
Lymphocytes Relative: 25 %
Lymphs Abs: 0.8 10*3/uL (ref 0.7–4.0)
MCH: 31.1 pg (ref 26.0–34.0)
MCHC: 33.8 g/dL (ref 30.0–36.0)
MCV: 91.9 fL (ref 80.0–100.0)
Monocytes Absolute: 0.3 10*3/uL (ref 0.1–1.0)
Monocytes Relative: 8 %
Neutro Abs: 2 10*3/uL (ref 1.7–7.7)
Neutrophils Relative %: 63 %
Platelets: 191 10*3/uL (ref 150–400)
RBC: 3.7 MIL/uL — ABNORMAL LOW (ref 4.22–5.81)
RDW: 14.5 % (ref 11.5–15.5)
WBC: 3.2 10*3/uL — ABNORMAL LOW (ref 4.0–10.5)
nRBC: 0 % (ref 0.0–0.2)

## 2024-01-12 LAB — LIPASE, BLOOD: Lipase: 452 U/L — ABNORMAL HIGH (ref 11–51)

## 2024-01-12 MED ORDER — HYDROCODONE-ACETAMINOPHEN 5-325 MG PO TABS
1.0000 | ORAL_TABLET | Freq: Once | ORAL | Status: AC
  Administered 2024-01-12: 1 via ORAL
  Filled 2024-01-12: qty 1

## 2024-01-12 MED ORDER — SODIUM CHLORIDE 0.9 % IV BOLUS: 1000.0000 mL | Freq: Once | INTRAVENOUS | Status: DC

## 2024-01-12 MED ORDER — ONDANSETRON HCL 4 MG/2ML IJ SOLN: 4.0000 mg | Freq: Once | INTRAMUSCULAR | Status: DC

## 2024-01-12 MED ORDER — KETOROLAC TROMETHAMINE 30 MG/ML IJ SOLN
30.0000 mg | Freq: Once | INTRAMUSCULAR | Status: AC
  Administered 2024-01-12: 30 mg via INTRAMUSCULAR
  Filled 2024-01-12: qty 1

## 2024-01-12 NOTE — Discharge Instructions (Signed)
 Your blood cell counts, kidney and liver function looked okay today.  Your pancreas test continues to be elevated.  Please continue to avoid alcohol, drink fluids and adhere to a bland diet.

## 2024-01-12 NOTE — ED Provider Notes (Signed)
 [place] EMERGENCY DEPARTMENT AT [facility] Provider Note   CSN: [id number] Arrival date & time: 01/12/24  1349     History  Chief Complaint  Patient presents with   Abdominal Pain    Gary [name] is a 38 y.o. male.  Patient presents to the emergency department for evaluation of chronic abdominal pain.  Patient with frequent ED visits for chronic pancreatitis.  Patient states that he stopped drinking a couple of weeks ago.  He denies any withdrawals.  He states that he has been eating soup and other foods.  Sometimes he does well other times a set off his symptoms.  Patient was seen in the emergency department yesterday.  He states that he left feeling better.  After eating lunch today he developed sharp stabbing pain in his mid abdomen.  Denies vomiting currently.       Home Medications Prior to Admission medications   Medication Sig Start Date End Date Taking? Authorizing Provider  bictegravir-emtricitabine-tenofovir AF (BIKTARVY) 50-200-25 MG TABS tablet Take 1 tablet by mouth daily. 12/27/23   Lewie Chamber, MD  levETIRAcetam (KEPPRA) 500 MG tablet Take 1 tablet (500 mg total) by mouth 2 (two) times daily. Patient not taking: Reported on 01/11/2024 11/18/23   Gunnar Bulla, MD  ondansetron (ZOFRAN-ODT) 4 MG disintegrating tablet Dissolve 1 tablet (4 mg total) by mouth every 8 (eight) hours as needed for nausea or vomiting. Patient not taking: Reported on 12/31/2023 12/29/23   Gailen Shelter, PA  QUEtiapine (SEROQUEL) 50 MG tablet Take 1 tablet (50 mg total) by mouth 2 (two) times daily. 12/25/23   Lewie Chamber, MD  sucralfate (CARAFATE) 1 g tablet Take 1 g by mouth 4 (four) times daily -  with meals and at bedtime. Patient not taking: Reported on 01/11/2024    [provider]  famotidine (PEPCID) 20 MG tablet Take 1 tablet (20 mg total) by mouth 2 (two) times daily. Patient not taking: Reported on 06/01/2019 05/14/19 06/02/19  Benjiman Core, MD       Allergies    Tegretol [carbamazepine] and Caffeine    Review of Systems   Review of Systems  Physical Exam Updated Vital Signs BP 108/81   Pulse 77   Temp 98.4 F (36.9 C)   Resp 18   SpO2 100%  Physical Exam Vitals and nursing note reviewed.  Constitutional:      General: He is not in acute distress.    Appearance: He is well-developed.  HENT:     Head: Normocephalic and atraumatic.  Eyes:     General:        Right eye: No discharge.        Left eye: No discharge.     Conjunctiva/sclera: Conjunctivae normal.  Cardiovascular:     Rate and Rhythm: Normal rate and regular rhythm.     Heart sounds: Normal heart sounds.  Pulmonary:     Effort: Pulmonary effort is normal.     Breath sounds: Normal breath sounds.  Abdominal:     Palpations: Abdomen is soft.     Tenderness: There is abdominal tenderness. There is no guarding or rebound. Negative signs include Murphy's sign and McBurney's sign.     Comments: No distinct rebound or guarding.  Patient winces with palpation, even light palpation, anywhere on the abdomen.  Musculoskeletal:     Cervical back: Normal range of motion and neck supple.  Skin:    General: Skin is warm and dry.  Neurological:  Mental Status: He is alert.     ED Results / Procedures / Treatments   Labs (all labs ordered are listed, but only abnormal results are displayed) Labs Reviewed  COMPREHENSIVE METABOLIC PANEL - Abnormal; Notable for the following components:      Result Value   Glucose, Bld 118 (*)    Creatinine, Ser 0.55 (*)    All other components within normal limits  LIPASE, BLOOD - Abnormal; Notable for the following components:   Lipase 452 (*)    All other components within normal limits  CBC WITH DIFFERENTIAL/PLATELET - Abnormal; Notable for the following components:   WBC 3.2 (*)    RBC 3.70 (*)    Hemoglobin 11.5 (*)    HCT 34.0 (*)    All other components within normal limits    EKG None  Radiology No results  found.  Procedures Procedures    Medications Ordered in ED Medications  sodium chloride 0.9 % bolus 1,000 mL (has no administration in time range)  ondansetron (ZOFRAN) injection 4 mg (has no administration in time range)  ketorolac (TORADOL) 30 MG/ML injection 30 mg (30 mg Intramuscular Given 01/12/24 2012)  HYDROcodone-acetaminophen (NORCO/VICODIN) 5-325 MG per tablet 1 tablet (1 tablet Oral Given 01/12/24 2012)    ED Course/ Medical Decision Making/ A&P Clinical Course as of 01/12/24 1927  Wed Jan 12, 2024  1852 Lipase(!): 452 [LS]    Clinical Course User Index [LS] Meade Maw, Student-PA   Patient seen and examined. History obtained directly from patient. Work-up including labs, imaging, EKG ordered in triage, if performed, were reviewed.    Labs/EKG: Independently reviewed and interpreted.  This included: CBC with white blood cell count 3.2, hemoglobin slightly low 11.5 though otherwise unremarkable; CMP normal electrolytes, creatinine slightly low at 0.55 with a BUN of 6 otherwise normal transaminases; lipase 452, increased from 380 yesterday and 297 12 days ago.  Imaging: None ordered  Medications/Fluids: Ordered: IM toradol, PO vicodin x 1.   Most recent vital signs reviewed and are as follows: BP 108/81   Pulse 77   Temp 98.4 F (36.9 C)   Resp 18   SpO2 100%   Initial impression: Chronic abdominal pain, chronic pancreatitis.  Patient's vitals are stable.  He looks well-hydrated today.  Will treat as above, plan for discharge.  8:22 PM Reassessment performed. Patient appears stable. Vitals remain normal.   Most current vital signs reviewed and are as follows: BP 108/81   Pulse 77   Temp 98.4 F (36.9 C)   Resp 18   SpO2 100%   Plan: Discharge to home.   Prescriptions written for: None  Other home care instructions discussed: Hydration, bland diet  ED return instructions discussed: New or worsening symptoms  Follow-up instructions discussed: Patient  encouraged to follow-up with their PCP in 3 days.                                 Medical Decision Making Risk Prescription drug management.   For this patient's complaint of abdominal pain, the following conditions were considered on the differential diagnosis: gastritis/PUD, enteritis/duodenitis, appendicitis, cholelithiasis/cholecystitis, cholangitis, pancreatitis, ruptured viscus, colitis, diverticulitis, small/large bowel obstruction, proctitis, cystitis, pyelonephritis, ureteral colic, aortic dissection, aortic aneurysm. Atypical chest etiologies were also considered including ACS, PE, and pneumonia.  Patient appears to be at baseline today.  He is not actively vomiting.  He does not look dehydrated.  Current complaints are  pain related.  Vitals are very reassuring.  He has chronic pancreatitis which is very difficult to control.  Indications for admission today.         Final Clinical Impression(s) / ED Diagnoses Final diagnoses:  Chronic abdominal pain    Rx / DC Orders ED Discharge Orders     None         Renne Crigler, PA-C 01/12/24 2023    Lonell Grandchild, MD 01/12/24 2351

## 2024-01-12 NOTE — ED Triage Notes (Signed)
 [facility]d [facility] starting after he ate a panini earlier today. Has hx of chronic pancreatitis, refused IM zofran with EMS, no line obtained, BP 100/60, HR 90, 98% RA.

## 2024-01-12 NOTE — ED Notes (Signed)
Patient declined zofran at this time.

## 2024-01-12 NOTE — ED Provider Triage Note (Signed)
 Emergency Medicine Provider Triage Evaluation Note  Gary [name] , a 38 y.o. male  was evaluated in triage.  Pt complains of nausea and vomiting with upper abdominal pain consistent with his prior episodes of pancreatitis.  He says he stopped drinking a few weeks ago.  Review of Systems  Positive: Abdominal pain, nausea vomiting Negative: Fevers  Physical Exam  BP 113/77 (BP Location: Right Arm)   Pulse 84   Temp 98.7 F (37.1 C) (Oral)   Resp 18   SpO2 100%  Gen:   Awake, no distress    Resp:  Normal effort   MSK:   Moves extremities without difficulty   Other:     Medical Decision Making  Medically screening exam initiated at 2:07 PM.  Appropriate orders placed.  COLAN LAYMON was informed that the remainder of the evaluation will be completed by another provider, this initial triage assessment does not replace that evaluation, and the importance of remaining in the ED until their evaluation is complete.      Rolan Bucco, MD 01/12/24 [phone]

## 2024-01-13 ENCOUNTER — Other Ambulatory Visit (HOSPITAL_COMMUNITY): Payer: Self-pay

## 2024-01-19 ENCOUNTER — Other Ambulatory Visit (HOSPITAL_COMMUNITY): Payer: Self-pay

## 2024-01-19 ENCOUNTER — Emergency Department (HOSPITAL_COMMUNITY)
Admission: EM | Admit: 2024-01-19 | Discharge: 2024-01-19 | Disposition: A | Discharge status: Home / Self Care | Payer: MEDICAID | Attending: Emergency Medicine | Admitting: Emergency Medicine

## 2024-01-19 ENCOUNTER — Encounter (HOSPITAL_COMMUNITY): Payer: Self-pay | Admitting: Emergency Medicine

## 2024-01-19 ENCOUNTER — Other Ambulatory Visit: Payer: Self-pay

## 2024-01-19 DIAGNOSIS — R1010 Upper abdominal pain, unspecified: Secondary | ICD-10-CM | POA: Diagnosis present

## 2024-01-19 DIAGNOSIS — G8929 Other chronic pain: Secondary | ICD-10-CM

## 2024-01-19 DIAGNOSIS — K860 Alcohol-induced chronic pancreatitis: Secondary | ICD-10-CM | POA: Insufficient documentation

## 2024-01-19 DIAGNOSIS — Z21 Asymptomatic human immunodeficiency virus [HIV] infection status: Secondary | ICD-10-CM | POA: Insufficient documentation

## 2024-01-19 LAB — COMPREHENSIVE METABOLIC PANEL
ALT: 11 U/L (ref 0–44)
AST: 13 U/L — ABNORMAL LOW (ref 15–41)
Albumin: 4.2 g/dL (ref 3.5–5.0)
Alkaline Phosphatase: 50 U/L (ref 38–126)
Anion gap: 10 (ref 5–15)
BUN: 12 mg/dL (ref 6–20)
CO2: 21 mmol/L — ABNORMAL LOW (ref 22–32)
Calcium: 9.1 mg/dL (ref 8.9–10.3)
Chloride: 104 mmol/L (ref 98–111)
Creatinine, Ser: 0.77 mg/dL (ref 0.61–1.24)
GFR, Estimated: >60 mL/min (ref >60)
Glucose, Bld: 126 mg/dL — ABNORMAL HIGH (ref 70–99)
Potassium: 3.4 mmol/L — ABNORMAL LOW (ref 3.5–5.1)
Sodium: 135 mmol/L (ref 135–145)
Total Bilirubin: 0.4 mg/dL (ref 0.0–1.2)
Total Protein: 7.4 g/dL (ref 6.5–8.1)

## 2024-01-19 LAB — URINALYSIS, ROUTINE W REFLEX MICROSCOPIC
Bilirubin Urine: NEGATIVE
Glucose, UA: NEGATIVE mg/dL
Hgb urine dipstick: NEGATIVE
Ketones, ur: NEGATIVE mg/dL
Nitrite: NEGATIVE
Protein, ur: NEGATIVE mg/dL
Specific Gravity, Urine: 1.015 (ref 1.005–1.030)
pH: 6 (ref 5.0–8.0)

## 2024-01-19 LAB — CBC
HCT: 35.2 % — ABNORMAL LOW (ref 39.0–52.0)
Hemoglobin: 11.8 g/dL — ABNORMAL LOW (ref 13.0–17.0)
MCH: 31 pg (ref 26.0–34.0)
MCHC: 33.5 g/dL (ref 30.0–36.0)
MCV: 92.4 fL (ref 80.0–100.0)
Platelets: 175 10*3/uL (ref 150–400)
RBC: 3.81 MIL/uL — ABNORMAL LOW (ref 4.22–5.81)
RDW: 14 % (ref 11.5–15.5)
WBC: 3.5 10*3/uL — ABNORMAL LOW (ref 4.0–10.5)
nRBC: 0 % (ref 0.0–0.2)

## 2024-01-19 LAB — LIPASE, BLOOD: Lipase: 335 U/L — ABNORMAL HIGH (ref 11–51)

## 2024-01-19 LAB — ETHANOL: Alcohol, Ethyl (B): <10 mg/dL (ref <10)

## 2024-01-19 MED ORDER — ALUM & MAG HYDROXIDE-SIMETH 200-200-20 MG/5ML PO SUSP
30.0000 mL | Freq: Once | ORAL | Status: AC
Start: 2024-01-19 — End: 2024-01-19
  Administered 2024-01-19: 30 mL via ORAL
  Filled 2024-01-19: qty 30

## 2024-01-19 MED ORDER — SUCRALFATE 1 G PO TABS
1.0000 g | ORAL_TABLET | Freq: Once | ORAL | Status: AC
  Administered 2024-01-19: 1 g via ORAL
  Filled 2024-01-19: qty 1

## 2024-01-19 MED ORDER — KETOROLAC TROMETHAMINE 30 MG/ML IJ SOLN
30.0000 mg | Freq: Once | INTRAMUSCULAR | Status: AC
  Administered 2024-01-19: 30 mg via INTRAMUSCULAR
  Filled 2024-01-19: qty 1

## 2024-01-19 MED ORDER — OXYCODONE-ACETAMINOPHEN 5-325 MG PO TABS
1.0000 | ORAL_TABLET | Freq: Once | ORAL | Status: AC
  Administered 2024-01-19: 1 via ORAL
  Filled 2024-01-19: qty 1

## 2024-01-19 MED ORDER — SUCRALFATE 1 G PO TABS
1.0000 g | ORAL_TABLET | Freq: Three times a day (TID) | ORAL | 0 refills | Status: DC
  Filled 2024-01-19: qty 30, 8d supply, fill #0

## 2024-01-19 NOTE — ED Triage Notes (Signed)
 BIBA Per EMS: Abd pain x 1 day. Hx pancreatitis states it feels the same. VSS

## 2024-01-19 NOTE — Discharge Instructions (Signed)
 You were seen today for your abdominal pain.  This is chronic in nature.  Your labs are stable from prior evaluations.  Continue to abstain from alcohol.  Take Carafate as needed.  You may have some component of gastritis in addition to your chronic pancreatitis.

## 2024-01-19 NOTE — ED Provider Notes (Signed)
 [place] EMERGENCY DEPARTMENT AT [facility] Provider Note   CSN: [id number] Arrival date & time: 01/19/24  8295     History  Chief Complaint  Patient presents with   Abdominal Pain    Gary [name] is a 38 y.o. male.  HPI     This is a 38 year old male well-known to our emergency department with a history of HIV, chronic pancreatitis, alcohol abuse who presents with abdominal pain.  Patient reports he ate some chicken tonight and had acute onset of sharp upper abdominal pain.  Is consistent with his prior pain related to pancreatitis; however, he states that it is worse.  Patient states that he is no longer drinking.  Reports his last alcohol use was 2 weeks ago.  Reports nausea and vomiting.  No diarrhea.  Home Medications Prior to Admission medications   Medication Sig Start Date End Date Taking? Authorizing Provider  sucralfate (CARAFATE) 1 g tablet Take 1 tablet (1 g total) by mouth 4 (four) times daily -  with meals and at bedtime. 01/19/24  Yes Kalicia Dufresne, Mayer Masker, MD  bictegravir-emtricitabine-tenofovir AF (BIKTARVY) 50-200-25 MG TABS tablet Take 1 tablet by mouth daily. 12/27/23   Lewie Chamber, MD  levETIRAcetam (KEPPRA) 500 MG tablet Take 1 tablet (500 mg total) by mouth 2 (two) times daily. Patient not taking: Reported on 01/11/2024 11/18/23   Gunnar Bulla, MD  ondansetron (ZOFRAN-ODT) 4 MG disintegrating tablet Dissolve 1 tablet (4 mg total) by mouth every 8 (eight) hours as needed for nausea or vomiting. Patient not taking: Reported on 12/31/2023 12/29/23   Gailen Shelter, PA  QUEtiapine (SEROQUEL) 50 MG tablet Take 1 tablet (50 mg total) by mouth 2 (two) times daily. 12/25/23   Lewie Chamber, MD  sucralfate (CARAFATE) 1 g tablet Take 1 g by mouth 4 (four) times daily -  with meals and at bedtime. Patient not taking: Reported on 01/11/2024    [provider]  famotidine (PEPCID) 20 MG tablet Take 1 tablet (20 mg total) by mouth 2 (two) times  daily. Patient not taking: Reported on 06/01/2019 05/14/19 06/02/19  Benjiman Core, MD      Allergies    Tegretol [carbamazepine] and Caffeine    Review of Systems   Review of Systems  Constitutional:  Negative for fever.  Gastrointestinal:  Positive for abdominal pain, nausea and vomiting.  All other systems reviewed and are negative.   Physical Exam Updated Vital Signs BP 113/73   Pulse 78   Temp 97.7 F (36.5 C) (Oral)   Resp 18   SpO2 100%  Physical Exam Vitals and nursing note reviewed.  Constitutional:      Comments: Chronically ill-appearing, no acute distress  HENT:     Head: Normocephalic and atraumatic.  Eyes:     Pupils: Pupils are equal, round, and reactive to light.  Cardiovascular:     Rate and Rhythm: Normal rate and regular rhythm.     Heart sounds: Normal heart sounds.  Pulmonary:     Effort: Pulmonary effort is normal. No respiratory distress.     Breath sounds: Normal breath sounds. No wheezing.  Abdominal:     General: Bowel sounds are normal.     Palpations: Abdomen is soft.     Tenderness: There is abdominal tenderness in the epigastric area. There is no guarding or rebound.  Musculoskeletal:     Cervical back: Neck supple.  Lymphadenopathy:     Cervical: No cervical adenopathy.  Skin:  General: Skin is warm and dry.  Neurological:     Mental Status: He is alert and oriented to person, place, and time.  Psychiatric:        Mood and Affect: Mood normal.     ED Results / Procedures / Treatments   Labs (all labs ordered are listed, but only abnormal results are displayed) Labs Reviewed  LIPASE, BLOOD - Abnormal; Notable for the following components:      Result Value   Lipase 335 (*)    All other components within normal limits  COMPREHENSIVE METABOLIC PANEL - Abnormal; Notable for the following components:   Potassium 3.4 (*)    CO2 21 (*)    Glucose, Bld 126 (*)    AST 13 (*)    All other components within normal limits  CBC -  Abnormal; Notable for the following components:   WBC 3.5 (*)    RBC 3.81 (*)    Hemoglobin 11.8 (*)    HCT 35.2 (*)    All other components within normal limits  ETHANOL  URINALYSIS, ROUTINE W REFLEX MICROSCOPIC    EKG None  Radiology No results found.  Procedures Procedures    Medications Ordered in ED Medications  ketorolac (TORADOL) 30 MG/ML injection 30 mg (30 mg Intramuscular Given 01/19/24 0333)  oxyCODONE-acetaminophen (PERCOCET/ROXICET) 5-325 MG per tablet 1 tablet (1 tablet Oral Given 01/19/24 0333)  sucralfate (CARAFATE) tablet 1 g (1 g Oral Given 01/19/24 0334)  alum & mag hydroxide-simeth (MAALOX/MYLANTA) 200-200-20 MG/5ML suspension 30 mL (30 mLs Oral Given 01/19/24 0335)    ED Course/ Medical Decision Making/ A&P                                 Medical Decision Making Amount and/or Complexity of Data Reviewed Labs: ordered.  Risk OTC drugs. Prescription drug management.   This patient presents to the ED for concern of abdominal pain, this involves an extensive number of treatment options, and is a complaint that carries with it a high risk of complications and morbidity.  I considered the following differential and admission for this acute, potentially life threatening condition.  The differential diagnosis includes pancreatitis, gastritis, cholecystitis, chronic pain  MDM:    This is a 38 year old male well-known to our emergency department who presents with recurrent epigastric abdominal pain.  Known history of alcohol induced pancreatitis and chronic abdominal pain.  He reports that he has not been drinking.  Alcohol level is less than 10.  Lipase is 335.  This is slightly lower than and consistent with recent values in the last 2 weeks.  Most recent CT imaging was in February.  Objectively, patient appears at his baseline.  His vital signs are stable without fever.  He is not significantly tachycardic.  Patient was given Toradol, Percocet, Carafate.  At  this time would hold off on further imaging as he is not in any distress and does not appear to have an acute emergent process.  I highly favor this being consistent with his chronic pain.  (Labs, imaging, consults)  Labs: I Ordered, and personally interpreted labs.  The pertinent results include: CBC, CMP, lipase, urinalysis  Imaging Studies ordered: I ordered imaging studies including none I independently visualized and interpreted imaging. I agree with the radiologist interpretation  Additional history obtained from chart review.  External records from outside source obtained and reviewed including prior evaluations  Cardiac Monitoring: The patient was  maintained on a cardiac monitor.  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   Anxiety    Bipolar 2 disorder (HCC)    HIV (human immunodeficiency virus infection) (HCC)    Pancreatitis    PTSD (post-traumatic stress disorder)    Schizophrenia (HCC)    Seizures (HCC)    Subdural hematoma (HCC)      Medicines Meds ordered this encounter  Medications   ketorolac (TORADOL) 30 MG/ML injection 30 mg   oxyCODONE-acetaminophen (PERCOCET/ROXICET) 5-325 MG per tablet 1 tablet    Refill:  0   sucralfate (CARAFATE) tablet 1 g   alum & mag hydroxide-simeth (MAALOX/MYLANTA) 200-200-20 MG/5ML suspension 30 mL   sucralfate (CARAFATE) 1 g tablet    Sig: Take 1 tablet (1 g total) by mouth 4 (four) times daily -  with meals and at bedtime.    Dispense:  30 tablet    Refill:  0    I have reviewed the  patients home medicines and have made adjustments as needed  Problem List / ED Course: Problem List Items Addressed This Visit       Digestive   Chronic pancreatitis (HCC)   Relevant Medications   sucralfate (CARAFATE) 1 g tablet   Other Visit Diagnoses       Chronic abdominal pain    -  Primary   Relevant Medications   ketorolac (TORADOL) 30 MG/ML injection 30 mg (Completed)   oxyCODONE-acetaminophen (PERCOCET/ROXICET) 5-325 MG per tablet 1 tablet (Completed)                   Final Clinical Impression(s) / ED Diagnoses Final diagnoses:  Chronic abdominal pain  Alcohol-induced chronic pancreatitis (HCC)    Rx / DC Orders ED Discharge Orders          Ordered    sucralfate (CARAFATE) 1 g tablet  3 times daily with meals & bedtime        01/19/24 0520              Shon Baton, MD 01/19/24 [phone]

## 2024-01-22 ENCOUNTER — Other Ambulatory Visit: Payer: Self-pay

## 2024-01-22 ENCOUNTER — Emergency Department (HOSPITAL_COMMUNITY)
Admission: EM | Admit: 2024-01-22 | Discharge: 2024-01-22 | Disposition: A | Discharge status: Home / Self Care | Payer: MEDICAID | Attending: Emergency Medicine | Admitting: Emergency Medicine

## 2024-01-22 ENCOUNTER — Encounter (HOSPITAL_COMMUNITY): Payer: Self-pay | Admitting: *Deleted

## 2024-01-22 DIAGNOSIS — Z21 Asymptomatic human immunodeficiency virus [HIV] infection status: Secondary | ICD-10-CM | POA: Diagnosis not present

## 2024-01-22 DIAGNOSIS — N4889 Other specified disorders of penis: Secondary | ICD-10-CM | POA: Diagnosis present

## 2024-01-22 DIAGNOSIS — Z5900 Homelessness unspecified: Secondary | ICD-10-CM | POA: Insufficient documentation

## 2024-01-22 DIAGNOSIS — N489 Disorder of penis, unspecified: Secondary | ICD-10-CM

## 2024-01-22 NOTE — Discharge Instructions (Addendum)
 Return for any problem.  As discussed, contact the ID clinic on Monday for follow-up.

## 2024-01-22 NOTE — ED Triage Notes (Signed)
 States he has a sore on his penis and wants to be checked for STD. States he noticed it several days ago

## 2024-01-22 NOTE — ED Notes (Signed)
Pt provided bus pass 

## 2024-01-22 NOTE — ED Provider Notes (Signed)
 [place] EMERGENCY DEPARTMENT AT [facility] Provider Note   CSN: [id number] Arrival date & time: 01/22/24  6295     History  Chief Complaint  Patient presents with   Exposure to STD    Gary [name] is a 38 y.o. male.  38 year old male with prior medical history as detailed below presents for evaluation.  Patient reports a lesion on the shaft of his penis.  It is itchy.  He denies pain.  He noticed that may be 1 or 2 days ago.  He denies any new recent sexual contacts.  He denies penile discharge or drainage.  He denies fever.  He denies inflamed lymph nodes in his groin.  He denies testicular pain.  His last sexual contact was approximately 3 months ago.  He reports that he is HIV positive.  He is overdue for a visit to the ID clinic.  He did not think about asking the ID clinic to take a look at his penile lesions.  He agrees that perhaps that would be the best course of action given his HIV positive status (patient is well-known to ID).  Patient is currently homeless.  He admits that he sleeps outside frequently.  He has several other areas/lesions on his body that are consistent with bug bites.  The history is provided by the patient and medical records.       Home Medications Prior to Admission medications   Medication Sig Start Date End Date Taking? Authorizing Provider  bictegravir-emtricitabine-tenofovir AF (BIKTARVY) 50-200-25 MG TABS tablet Take 1 tablet by mouth daily. 12/27/23   Lewie Chamber, MD  levETIRAcetam (KEPPRA) 500 MG tablet Take 1 tablet (500 mg total) by mouth 2 (two) times daily. Patient not taking: Reported on 01/11/2024 11/18/23   Gunnar Bulla, MD  ondansetron (ZOFRAN-ODT) 4 MG disintegrating tablet Dissolve 1 tablet (4 mg total) by mouth every 8 (eight) hours as needed for nausea or vomiting. Patient not taking: Reported on 12/31/2023 12/29/23   Gailen Shelter, PA  QUEtiapine (SEROQUEL) 50 MG tablet Take 1 tablet (50 mg total) by mouth 2 (two)  times daily. 12/25/23   Lewie Chamber, MD  sucralfate (CARAFATE) 1 g tablet Take 1 g by mouth 4 (four) times daily -  with meals and at bedtime. Patient not taking: Reported on 01/11/2024    [provider]  sucralfate (CARAFATE) 1 g tablet Take 1 tablet (1 g total) by mouth 4 (four) times daily -  with meals and at bedtime. 01/19/24   Horton, Mayer Masker, MD  famotidine (PEPCID) 20 MG tablet Take 1 tablet (20 mg total) by mouth 2 (two) times daily. Patient not taking: Reported on 06/01/2019 05/14/19 06/02/19  Benjiman Core, MD      Allergies    Tegretol [carbamazepine] and Caffeine    Review of Systems   Review of Systems  All other systems reviewed and are negative.   Physical Exam Updated Vital Signs BP 100/62 (BP Location: Right Arm)   Pulse 87   Temp 98.2 F (36.8 C) (Oral)   Resp 18   Ht 6' (1.829 m)   Wt 68 kg   SpO2 100%   BMI 20.34 kg/m  Physical Exam Vitals and nursing note reviewed.  Constitutional:      General: He is not in acute distress.    Appearance: Normal appearance. He is well-developed.  HENT:     Head: Normocephalic and atraumatic.  Eyes:     Conjunctiva/sclera: Conjunctivae normal.  Pupils: Pupils are equal, round, and reactive to light.  Cardiovascular:     Rate and Rhythm: Normal rate and regular rhythm.     Heart sounds: Normal heart sounds.  Pulmonary:     Effort: Pulmonary effort is normal. No respiratory distress.     Breath sounds: Normal breath sounds.  Abdominal:     General: There is no distension.     Palpations: Abdomen is soft.     Tenderness: There is no abdominal tenderness.  Genitourinary:    Comments: 2 small lesions on the shaft of the penis.  These lesions are most consistent with small insect bites.  No significant surrounding erythema or drainage.  No penile discharge or drainage noted.  No lymphadenopathy in the groin.  Normal testicles.  No rash or other lesions visualized on examination of the patient's  groin. Musculoskeletal:        General: No deformity. Normal range of motion.     Cervical back: Normal range of motion and neck supple.  Skin:    General: Skin is warm and dry.  Neurological:     General: No focal deficit present.     Mental Status: He is alert and oriented to person, place, and time.     ED Results / Procedures / Treatments   Labs (all labs ordered are listed, but only abnormal results are displayed) Labs Reviewed  GC/CHLAMYDIA PROBE AMP ([place]) NOT AT [facility]    EKG None  Radiology No results found.  Procedures Procedures    Medications Ordered in ED Medications - No data to display  ED Course/ Medical Decision Making/ A&P                                 Medical Decision Making   Medical Screen Complete  This patient presented to the ED with complaint of " itchy spots on penis".  This complaint involves an extensive number of treatment options. The initial differential diagnosis includes, but is not limited to, insect bite, STD, etc.  This presentation is: Acute, Self-Limited, Previously Undiagnosed, and Uncertain Prognosis  Patient is presenting with complaint of itchy lesions on his penile shaft.  Exam is most consistent with likely bug bite.  Patient without penile discharge, penile drainage, genital rash, lymphadenopathy, recent sexual contact.  After discussion with patient appears that his symptoms today are unlikely to be an STD.  He is agreeable with plan to obtain a urine for GC chlamydia testing.  We will hold off on treatment given the unlikely chance that his symptoms today are related to a gonorrhea or chlamydial infection.  Patient is known to ID.  He was planning on talking to them on Monday anyway since he is overdue for a visit.  I advised him that ID could certainly continue to workup his lesions if they do not resolve on their own.  He was also educated about the availability of the health department as a resource.  Co  morbidities that complicated the patient's evaluation  See HPI   Additional history obtained: External records from outside sources obtained and reviewed including prior ED visits and prior Inpatient records.    Problem List / ED Course:  Penile lesion   Reevaluation:  After the interventions noted above, I reevaluated the patient and found that they have: stayed the same    Disposition:  After consideration of the diagnostic results and the patients response to treatment,  I feel that the patent would benefit from close outpatient follow-up.          Final Clinical Impression(s) / ED Diagnoses Final diagnoses:  Penile lesion    Rx / DC Orders ED Discharge Orders     None         Wynetta Fines, MD 01/22/24 [phone]

## 2024-01-24 LAB — GC/CHLAMYDIA PROBE AMP (~~LOC~~) NOT AT ARMC
Chlamydia: NEGATIVE
Comment: NEGATIVE
Comment: NORMAL
Neisseria Gonorrhea: NEGATIVE

## 2024-01-27 ENCOUNTER — Other Ambulatory Visit: Payer: Self-pay

## 2024-01-27 ENCOUNTER — Other Ambulatory Visit (HOSPITAL_COMMUNITY): Payer: Self-pay

## 2024-01-27 NOTE — Telephone Encounter (Signed)
 He can continue Carafate but will have to space it out appropriately from [facility]; this will be tricky since he is taking it 4 times daily at meals and bedtime. Biktarvy needs to be taken at least 2 hours before or 6 hours after polyvalent cations. Marchelle Folks

## 2024-01-28 ENCOUNTER — Other Ambulatory Visit: Payer: Self-pay

## 2024-01-28 ENCOUNTER — Other Ambulatory Visit (HOSPITAL_COMMUNITY): Payer: Self-pay

## 2024-01-28 ENCOUNTER — Ambulatory Visit (INDEPENDENT_AMBULATORY_CARE_PROVIDER_SITE_OTHER): Payer: MEDICAID | Admitting: Infectious Disease

## 2024-01-28 ENCOUNTER — Other Ambulatory Visit: Payer: Self-pay | Admitting: Pharmacist

## 2024-01-28 ENCOUNTER — Encounter: Payer: Self-pay | Admitting: Internal Medicine

## 2024-01-28 VITALS — BP 90/62 | HR 85 | Temp 97.6°F | Ht 72.0 in | Wt 135.0 lb

## 2024-01-28 DIAGNOSIS — Z5900 Homelessness unspecified: Secondary | ICD-10-CM | POA: Diagnosis not present

## 2024-01-28 DIAGNOSIS — B20 Human immunodeficiency virus [HIV] disease: Secondary | ICD-10-CM | POA: Diagnosis not present

## 2024-01-28 DIAGNOSIS — Z23 Encounter for immunization: Secondary | ICD-10-CM

## 2024-01-28 DIAGNOSIS — R21 Rash and other nonspecific skin eruption: Secondary | ICD-10-CM

## 2024-01-28 DIAGNOSIS — Z113 Encounter for screening for infections with a predominantly sexual mode of transmission: Secondary | ICD-10-CM

## 2024-01-28 MED ORDER — HYDROXYZINE PAMOATE 25 MG PO CAPS
25.0000 mg | ORAL_CAPSULE | Freq: Three times a day (TID) | ORAL | 0 refills | Status: DC | PRN
  Filled 2024-01-28: qty 30, 10d supply, fill #0

## 2024-01-28 MED ORDER — BICTEGRAVIR-EMTRICITAB-TENOFOV 50-200-25 MG PO TABS
1.0000 | ORAL_TABLET | Freq: Every day | ORAL | 5 refills | Status: DC
  Filled 2024-01-28: qty 30, 30d supply, fill #0

## 2024-01-28 NOTE — Progress Notes (Signed)
 Specialty Pharmacy Initiation Note   THANH MOTTERN is a 38 y.o. male who will be followed by the specialty pharmacy service for RxSp HIV    Review of administration, indication, effectiveness, safety, potential side effects, storage/disposable, and missed dose instructions occurred today for patient's specialty medication(s) Bictegravir-Emtricitab-Tenofov [facility])     Patient/Caregiver did not have any additional questions or concerns.   Patient's therapy is appropriate to: Continue    Goals Addressed             This Visit's Progress    Achieve Undetectable HIV Viral Load < 20       Patient is not on track and improving. Patient will work on increased adherence      Increase CD4 count until steady state       Patient is on track. Patient will maintain adherence         Jennette Kettle Specialty Pharmacist

## 2024-01-28 NOTE — Patient Instructions (Signed)
 Heplisav today series 1 of 2   Labs today   See me in 3 months   Let us know if you want to get in touch with our case manager to help with housing, job, transportation

## 2024-01-28 NOTE — Progress Notes (Signed)
 Specialty Pharmacy Initial Fill Coordination Note  Gary [name] is a 38 y.o. male contacted today regarding initial fill of specialty medication(s) Bictegravir-Emtricitab-Tenofov [facility])   Patient requested Courier to Provider Office   Delivery date: 01/31/24   Verified address: [address] [place] [address]   Medication will be filled on 01/31/24.   Patient is aware of 0.00 copayment.

## 2024-01-28 NOTE — Addendum Note (Signed)
 Addended by: Linna Hoff D on: 01/28/2024 11:16 AM   Modules accepted: Orders

## 2024-01-28 NOTE — Telephone Encounter (Signed)
 PATIENT NO SHOWED APPOINTMENT ON 01/28/2024.  No phone number in chart to contact patient.

## 2024-01-28 NOTE — Progress Notes (Signed)
 [facility]  Patient Active Problem List   Diagnosis Date Noted   Ileus (HCC) 12/25/2023   PTSD (post-traumatic stress disorder) 12/25/2023   Anxiety state 12/20/2023   Acute pancreatitis 12/17/2023   Acute alcoholic pancreatitis 12/10/2023   Acute on chronic anemia 12/05/2023   Alcohol withdrawal (HCC) 11/26/2023   Seizure (HCC) 11/19/2023   Gastritis and duodenitis 11/19/2023   Chronic pancreatitis (HCC) 10/16/2023   Alcoholic fatty liver 09/25/2023   Seizure disorder (HCC) 12/17/2022   Pancytopenia (HCC) 12/14/2022   Alcohol-induced pancreatitis 12/14/2022   Delirium tremens (HCC) 10/19/2022   Alcohol dependence with withdrawal (HCC) 10/16/2022   Homelessness 05/28/2022   Constipation 07/30/2021   Malingering 05/15/2020   Alcohol use disorder, severe, dependence (HCC) 04/16/2019   HIV disease (HCC) 06/16/2018   Polysubstance abuse (HCC) 06/16/2018   Nicotine dependence, cigarettes, uncomplicated 06/16/2018      Subjective:    Patient ID: Gary [name], male    DOB: [birth year], 38 y.o.   MRN: [id number]  Chief Complaint  Patient presents with   Follow-up    Sore on penis    HPI:  Gary [name] is a 38 y.o. male hiv here for reestablishing care after loss to follow up   #hiv Dx'ed 2018 in [place], [place] Cd4 nadir >200 OI hx - none Risk msm; ivdu Therapy: 2022-c biktarvy 2020-2022 genvoya  Had history of inconsistent adherence  He last saw dr Daiva Eves in 2022 He was admitted and seen in ED several times the last few months prior to this visit 04/08/23 with alcohol intoxication/withdrawal He was actually seen on the inpatient side in 12/2022 by dr Daiva Eves He is currently in jail  He said his biktarvy was stopped for the past 3 days but was continuously on it the 2 months in jail  He has no health concern today but did say "I might have epilepsi now because it hit my head twice walking into something not seeing  it."    #social He has been incarcerated as of 04/08/23 for about 2 months for some misdemeanor  ---------------- 01/28/24 id clinic f/u Reivewed chart Recent ed visit for rash on trunk/penile shaft -- ed staff thought bug bite; urine gc/chlam negative. Also upper gi pain given carafate 12/07/23 rpr nonreactive 12/07/23 hiv rna 90  <---  10/2023 4.5k; cd4 460 (45%) Rash 1 week very itchy on the right side of penile shaft 2 small lesions. He has been scratching a lot. No other lesion. No fever, chill. No malaise. Sleeping outside (woods/concrete) Last sex encounter 3 months prior Had pancreatitis and alcohol withdrawal multiple ed visits; admitted early 12/2023 Weight loss due to etoh/pancreatitis No nightsweat, joint pain, back pain No headache, vision change No n/v/diarrhea He has been taking his biktarvy and 2 missed dose last 4 weeks   ROS: All other ros negative   Allergies  Allergen Reactions   Tegretol [Carbamazepine] Other (See Comments)    Vertigo  Paralysis   Caffeine Palpitations      Outpatient Medications Prior to Visit  Medication Sig Dispense Refill   bictegravir-emtricitabine-tenofovir AF (BIKTARVY) 50-200-25 MG TABS tablet Take 1 tablet by mouth daily. 90 tablet 0   QUEtiapine (SEROQUEL) 50 MG tablet Take 1 tablet (50 mg total) by mouth 2 (two) times daily. 60 tablet 3   levETIRAcetam (KEPPRA) 500 MG tablet Take 1 tablet (500 mg total) by mouth 2 (two) times daily. (Patient not taking: Reported on  01/11/2024) 60 tablet 0   ondansetron (ZOFRAN-ODT) 4 MG disintegrating tablet Dissolve 1 tablet (4 mg total) by mouth every 8 (eight) hours as needed for nausea or vomiting. (Patient not taking: Reported on 12/31/2023) 20 tablet 0   sucralfate (CARAFATE) 1 g tablet Take 1 g by mouth 4 (four) times daily -  with meals and at bedtime. (Patient not taking: Reported on 01/11/2024)     sucralfate (CARAFATE) 1 g tablet Take 1 tablet (1 g total) by mouth 4 (four) times daily -   with meals and at bedtime. (Patient not taking: Reported on 01/28/2024) 30 tablet 0   No facility-administered medications prior to visit.     Social History   Socioeconomic History   Marital status: Single    Spouse name: Not on file   Number of children: Not on file   Years of education: Not on file   Highest education level: Not on file  Occupational History   Occupation: unemployed  Tobacco Use   Smoking status: Every Day    Current packs/day: 0.50    Average packs/day: 0.5 packs/day for 21.2 years (10.6 ttl pk-yrs)    Types: Cigarettes    Start date: 2004   Smokeless tobacco: Never   Tobacco comments:    unable to smoke while incarcerated 6+ months 02/13/20  Vaping Use   Vaping status: Never Used  Substance and Sexual Activity   Alcohol use: Not Currently    Alcohol/week: 105.0 standard drinks of alcohol    Types: 105 Cans of beer per week    Comment: drinks every day, all day, "whatever I can get my hands on". "Half a gallon of vodka" 12/16   Drug use: Not Currently    Types: Methamphetamines, Cocaine    Comment: last used 10/22/2023   Sexual activity: Yes    Partners: Male, Male    Comment: declined condoms 01/2024  Other Topics Concern   Not on file  Social History Narrative   "Currently living on the streets"   Independent at baseline   Occasionally goes to the [facility]   Social Drivers of Health   Financial Resource Strain: Patient Declined (10/11/2023)   Overall Financial Resource Strain (CARDIA)    Difficulty of Paying Living Expenses: Patient declined  Food Insecurity: Food Insecurity Present (12/17/2023)   Hunger Vital Sign    Worried About Running Out of Food in the Last Year: Often true    Ran Out of Food in the Last Year: Often true  Transportation Needs: Unmet Transportation Needs (12/17/2023)   PRAPARE - Transportation    Lack of Transportation (Medical): Yes    Lack of Transportation (Non-Medical): Yes  Physical Activity: Not on file  Stress: No  Stress Concern Present (06/30/2022)   Received from [facility]   [employer] of Occupational Health - Occupational Stress Questionnaire    Feeling of Stress : Not at all  Recent Concern: Stress - Stress Concern Present (06/25/2022)   Received from [facility] of Occupational Health - Occupational Stress Questionnaire    Feeling of Stress : Rather much  Social Connections: Socially Isolated (12/17/2023)   Social Connection and Isolation Panel [NHANES]    Frequency of Communication with Friends and Family: Never    Frequency of Social Gatherings with Friends and Family: Never    Attends Religious Services: Never    Database administrator or Organizations: No    Attends Banker Meetings: Never  Marital Status: Never married  Intimate Partner Violence: Not At Risk (12/17/2023)   Humiliation, Afraid, Rape, and Kick questionnaire    Fear of Current or Ex-Partner: No    Emotionally Abused: No    Physically Abused: No    Sexually Abused: No      Review of Systems    All other ros negative  Objective:    BP 90/62   Pulse 85   Temp 97.6 F (36.4 C) (Oral)   Ht 6' (1.829 m)   Wt 135 lb (61.2 kg)   SpO2 100%   BMI 18.31 kg/m  Nursing note and vital signs reviewed.  Physical Exam     General/constitutional: no distress, pleasant; here with 2 correctional officer HEENT: Normocephalic, PER, Conj Clear, EOMI, Oropharynx clear Neck supple CV: rrr no mrg Lungs: clear to auscultation, normal respiratory effort Abd: Soft, Nontender Ext: no edema Skin: shotty bilateral inguinal small adneopathy nontender Ulceration without cellulitis change right penile shaft  01/28/24    Neuro: nonfocal MSK: no peripheral joint swelling/tenderness/warmth; back spines nontender     Labs: Lab Results  Component Value Date   WBC 3.5 (L) 01/19/2024   HGB 11.8 (L) 01/19/2024   HCT 35.2 (L) 01/19/2024   MCV 92.4 01/19/2024   PLT 175  01/19/2024   Last metabolic panel Lab Results  Component Value Date   GLUCOSE 126 (H) 01/19/2024   NA 135 01/19/2024   K 3.4 (L) 01/19/2024   CL 104 01/19/2024   CO2 21 (L) 01/19/2024   BUN 12 01/19/2024   CREATININE 0.77 01/19/2024   GFRNONAA >60 01/19/2024   CALCIUM 9.1 01/19/2024   PHOS 3.5 12/17/2023   PROT 7.4 01/19/2024   ALBUMIN 4.2 01/19/2024   BILITOT 0.4 01/19/2024   ALKPHOS 50 01/19/2024   AST 13 (L) 01/19/2024   ALT 11 01/19/2024   ANIONGAP 10 01/19/2024   Lab Results  Component Value Date   HIV1RNAQUANT 90 12/07/2023   Lab Results  Component Value Date   CD4TCELL 43 12/08/2023   CD4TABS 598 12/08/2023      HIV genotype: 09/2019 K103N presence; no integrase resistance predicted Micro:  Serology:  Imaging:  Assessment & Plan:   Problem List Items Addressed This Visit     HIV disease (HCC)   Relevant Medications   bictegravir-emtricitabine-tenofovir AF (BIKTARVY) 50-200-25 MG TABS tablet   Other Relevant Orders   HIV 1 RNA quant-no reflex-bld   Homelessness   Other Visit Diagnoses       Screening for STDs (sexually transmitted diseases)    -  Primary   Relevant Orders   RPR     Rash              No orders of the defined types were placed in this encounter.  #hiv Msm/ivdu Dxed 2018 Darrin Luis [id number] Biktarvy 2022-c   In jail currently and has been compliant except 3 days prior to 04/08/23  01/28/24 taking biktarvy. Homeless. Comes to [facility] to pick up rx   -discussed u=u -encourage compliance -continue current HIV medication biktarvy -labs today -f/u in 3 months; more often given his homeless status    #etoh abuse #chronic pancreatitis Had passed forced detox for last 2 months in jail as of 04/08/23  -currently quiescent   #rash Doesn't appear std Shotty bilateral LAD inguinal ?hiv vs him being very thin Supportive care Hydroxazine prn itch Rpr testing sent   #hcm -vaccination Will review next few  visits -hepatitis 03/2023 core ab and sAg  nonreactive; sAb <5 -tb Deferred -std Triple screen and rpr testing today 04/08/23 -- negative; 01/2024 urine gc/chlam negative -cancer screening He meets anal pap at age 77 criteria and will do next visit    Follow-up: Return in about 3 months (around 04/29/2024).      Raymondo Band, MD [facility] 01/28/2024, 10:39 AM

## 2024-01-30 LAB — HIV-1 RNA QUANT-NO REFLEX-BLD
HIV 1 RNA Quant: <20 {copies}/mL — AB
HIV-1 RNA Quant, Log: <1.3 {Log_copies}/mL — AB

## 2024-01-30 LAB — RPR: RPR Ser Ql: NONREACTIVE

## 2024-01-31 ENCOUNTER — Telehealth: Payer: Self-pay

## 2024-01-31 NOTE — Telephone Encounter (Signed)
 [facility] Patient Advocate Encounter  Patient's medications Biktarvy & Hydroxyzine have been couriered to [facility] from [facility] and will be picked up on 02/01/24.  Clearance Coots, CPhT Specialty Pharmacy Patient Physician [facility] Phone: [phone] Fax:  [phone]

## 2024-02-01 ENCOUNTER — Other Ambulatory Visit (HOSPITAL_COMMUNITY): Payer: Self-pay

## 2024-02-02 ENCOUNTER — Emergency Department (HOSPITAL_COMMUNITY): Payer: MEDICAID

## 2024-02-02 ENCOUNTER — Encounter (HOSPITAL_COMMUNITY): Payer: Self-pay

## 2024-02-02 ENCOUNTER — Other Ambulatory Visit: Payer: Self-pay

## 2024-02-02 ENCOUNTER — Inpatient Hospital Stay (HOSPITAL_COMMUNITY)
Admission: EM | Admit: 2024-02-02 | Discharge: 2024-02-04 | DRG: 438 | Discharge status: Against Medical Advice | Payer: MEDICAID | Attending: Internal Medicine | Admitting: Internal Medicine

## 2024-02-02 DIAGNOSIS — F3181 Bipolar II disorder: Secondary | ICD-10-CM | POA: Diagnosis present

## 2024-02-02 DIAGNOSIS — F10220 Alcohol dependence with intoxication, uncomplicated: Secondary | ICD-10-CM | POA: Diagnosis present

## 2024-02-02 DIAGNOSIS — Z79899 Other long term (current) drug therapy: Secondary | ICD-10-CM

## 2024-02-02 DIAGNOSIS — I81 Portal vein thrombosis: Secondary | ICD-10-CM

## 2024-02-02 DIAGNOSIS — N400 Enlarged prostate without lower urinary tract symptoms: Secondary | ICD-10-CM | POA: Diagnosis present

## 2024-02-02 DIAGNOSIS — F17210 Nicotine dependence, cigarettes, uncomplicated: Secondary | ICD-10-CM | POA: Diagnosis present

## 2024-02-02 DIAGNOSIS — F10239 Alcohol dependence with withdrawal, unspecified: Secondary | ICD-10-CM | POA: Diagnosis present

## 2024-02-02 DIAGNOSIS — K852 Alcohol induced acute pancreatitis without necrosis or infection: Principal | ICD-10-CM | POA: Diagnosis present

## 2024-02-02 DIAGNOSIS — G928 Other toxic encephalopathy: Secondary | ICD-10-CM | POA: Diagnosis present

## 2024-02-02 DIAGNOSIS — F1020 Alcohol dependence, uncomplicated: Secondary | ICD-10-CM | POA: Diagnosis present

## 2024-02-02 DIAGNOSIS — F209 Schizophrenia, unspecified: Secondary | ICD-10-CM | POA: Diagnosis present

## 2024-02-02 DIAGNOSIS — Z811 Family history of alcohol abuse and dependence: Secondary | ICD-10-CM

## 2024-02-02 DIAGNOSIS — K860 Alcohol-induced chronic pancreatitis: Principal | ICD-10-CM

## 2024-02-02 DIAGNOSIS — F419 Anxiety disorder, unspecified: Secondary | ICD-10-CM | POA: Diagnosis present

## 2024-02-02 DIAGNOSIS — E876 Hypokalemia: Secondary | ICD-10-CM | POA: Diagnosis present

## 2024-02-02 DIAGNOSIS — K859 Acute pancreatitis without necrosis or infection, unspecified: Secondary | ICD-10-CM | POA: Diagnosis present

## 2024-02-02 DIAGNOSIS — B20 Human immunodeficiency virus [HIV] disease: Secondary | ICD-10-CM | POA: Diagnosis present

## 2024-02-02 DIAGNOSIS — G40909 Epilepsy, unspecified, not intractable, without status epilepticus: Secondary | ICD-10-CM | POA: Diagnosis present

## 2024-02-02 DIAGNOSIS — K760 Fatty (change of) liver, not elsewhere classified: Secondary | ICD-10-CM | POA: Diagnosis present

## 2024-02-02 DIAGNOSIS — F4310 Post-traumatic stress disorder, unspecified: Secondary | ICD-10-CM | POA: Diagnosis present

## 2024-02-02 DIAGNOSIS — Z888 Allergy status to other drugs, medicaments and biological substances status: Secondary | ICD-10-CM

## 2024-02-02 DIAGNOSIS — I700 Atherosclerosis of aorta: Secondary | ICD-10-CM | POA: Diagnosis present

## 2024-02-02 DIAGNOSIS — Z21 Asymptomatic human immunodeficiency virus [HIV] infection status: Secondary | ICD-10-CM | POA: Diagnosis present

## 2024-02-02 DIAGNOSIS — I871 Compression of vein: Secondary | ICD-10-CM | POA: Diagnosis present

## 2024-02-02 DIAGNOSIS — Z681 Body mass index (BMI) 19 or less, adult: Secondary | ICD-10-CM

## 2024-02-02 DIAGNOSIS — Z87820 Personal history of traumatic brain injury: Secondary | ICD-10-CM

## 2024-02-02 DIAGNOSIS — E43 Unspecified severe protein-calorie malnutrition: Secondary | ICD-10-CM | POA: Diagnosis present

## 2024-02-02 DIAGNOSIS — Z765 Malingerer [conscious simulation]: Secondary | ICD-10-CM

## 2024-02-02 DIAGNOSIS — Y902 Blood alcohol level of 40-59 mg/100 ml: Secondary | ICD-10-CM | POA: Diagnosis present

## 2024-02-02 DIAGNOSIS — E538 Deficiency of other specified B group vitamins: Secondary | ICD-10-CM | POA: Diagnosis present

## 2024-02-02 DIAGNOSIS — K8591 Acute pancreatitis with uninfected necrosis, unspecified: Secondary | ICD-10-CM | POA: Diagnosis present

## 2024-02-02 DIAGNOSIS — Z5900 Homelessness unspecified: Secondary | ICD-10-CM

## 2024-02-02 LAB — COMPREHENSIVE METABOLIC PANEL
ALT: 17 U/L (ref 0–44)
AST: 19 U/L (ref 15–41)
Albumin: 3.8 g/dL (ref 3.5–5.0)
Alkaline Phosphatase: 54 U/L (ref 38–126)
Anion gap: 6 (ref 5–15)
BUN: 8 mg/dL (ref 6–20)
CO2: 27 mmol/L (ref 22–32)
Calcium: 9.3 mg/dL (ref 8.9–10.3)
Chloride: 109 mmol/L (ref 98–111)
Creatinine, Ser: 0.61 mg/dL (ref 0.61–1.24)
GFR, Estimated: >60 mL/min (ref >60)
Glucose, Bld: 103 mg/dL — ABNORMAL HIGH (ref 70–99)
Potassium: 3.7 mmol/L (ref 3.5–5.1)
Sodium: 142 mmol/L (ref 135–145)
Total Bilirubin: 0.4 mg/dL (ref 0.0–1.2)
Total Protein: 6.6 g/dL (ref 6.5–8.1)

## 2024-02-02 LAB — CBC
HCT: 35.9 % — ABNORMAL LOW (ref 39.0–52.0)
Hemoglobin: 12 g/dL — ABNORMAL LOW (ref 13.0–17.0)
MCH: 31 pg (ref 26.0–34.0)
MCHC: 33.4 g/dL (ref 30.0–36.0)
MCV: 92.8 fL (ref 80.0–100.0)
Platelets: 212 10*3/uL (ref 150–400)
RBC: 3.87 MIL/uL — ABNORMAL LOW (ref 4.22–5.81)
RDW: 14.4 % (ref 11.5–15.5)
WBC: 4.2 10*3/uL (ref 4.0–10.5)
nRBC: 0 % (ref 0.0–0.2)

## 2024-02-02 LAB — ETHANOL: Alcohol, Ethyl (B): 45 mg/dL — ABNORMAL HIGH (ref <10)

## 2024-02-02 LAB — LIPASE, BLOOD: Lipase: 607 U/L — ABNORMAL HIGH (ref 11–51)

## 2024-02-02 MED ORDER — LORAZEPAM 1 MG PO TABS
1.0000 mg | ORAL_TABLET | Freq: Once | ORAL | Status: AC
  Administered 2024-02-02: 1 mg via ORAL
  Filled 2024-02-02: qty 1

## 2024-02-02 MED ORDER — SODIUM CHLORIDE 0.9 % IV BOLUS
1000.0000 mL | Freq: Once | INTRAVENOUS | Status: AC
  Administered 2024-02-02: 1000 mL via INTRAVENOUS

## 2024-02-02 MED ORDER — THIAMINE MONONITRATE 100 MG PO TABS
100.0000 mg | ORAL_TABLET | Freq: Every day | ORAL | Status: DC
  Administered 2024-02-03 – 2024-02-04 (×2): 100 mg via ORAL
  Filled 2024-02-02 (×2): qty 1

## 2024-02-02 MED ORDER — THIAMINE HCL 100 MG/ML IJ SOLN
100.0000 mg | Freq: Every day | INTRAMUSCULAR | Status: DC
  Filled 2024-02-02: qty 2

## 2024-02-02 MED ORDER — ONDANSETRON 4 MG PO TBDP
4.0000 mg | ORAL_TABLET | Freq: Once | ORAL | Status: AC
  Administered 2024-02-02: 4 mg via ORAL
  Filled 2024-02-02: qty 1

## 2024-02-02 MED ORDER — LORAZEPAM 1 MG PO TABS: 0.0000 mg | ORAL_TABLET | Freq: Two times a day (BID) | ORAL | Status: DC

## 2024-02-02 MED ORDER — LORAZEPAM 2 MG/ML IJ SOLN: 0.0000 mg | Freq: Two times a day (BID) | INTRAMUSCULAR | Status: DC

## 2024-02-02 MED ORDER — MORPHINE SULFATE (PF) 4 MG/ML IV SOLN
4.0000 mg | Freq: Once | INTRAVENOUS | Status: AC
  Administered 2024-02-02: 4 mg via INTRAVENOUS
  Filled 2024-02-02: qty 1

## 2024-02-02 MED ORDER — LORAZEPAM 2 MG/ML IJ SOLN: 1.0000 mg | Freq: Once | INTRAMUSCULAR | Status: DC

## 2024-02-02 MED ORDER — LORAZEPAM 2 MG/ML IJ SOLN
0.0000 mg | Freq: Four times a day (QID) | INTRAMUSCULAR | Status: DC
Start: 2024-02-02 — End: 2024-02-04
  Administered 2024-02-02: 3 mg via INTRAVENOUS
  Filled 2024-02-02: qty 2

## 2024-02-02 MED ORDER — IOHEXOL 350 MG/ML SOLN
75.0000 mL | Freq: Once | INTRAVENOUS | Status: AC | PRN
  Administered 2024-02-02: 75 mL via INTRAVENOUS

## 2024-02-02 MED ORDER — LORAZEPAM 1 MG PO TABS: 0.0000 mg | ORAL_TABLET | Freq: Four times a day (QID) | ORAL | Status: DC

## 2024-02-02 NOTE — ED Provider Notes (Signed)
 [place] EMERGENCY DEPARTMENT AT [facility] Provider Note   CSN: [id number] Arrival date & time: 02/02/24  1812     History  Chief Complaint  Patient presents with   Abdominal Pain    Gary [name] is a 38 y.o. male history schizophrenia, seizures, subdural hematoma, alcohol use, HIV, bipolar 2, alcohol induced pancreatitis presented for epigastric pain.  Symptoms have been going on since yesterday however states that since beginning last week he has been having 1/5 of hard liquor daily and his last alcohol intake was yesterday morning.  Patient has a history of severe alcohol dependence along with seizures.  Patient does not feel like he is going through seizures at this time.    Home Medications Prior to Admission medications   Medication Sig Start Date End Date Taking? Authorizing Provider  bictegravir-emtricitabine-tenofovir AF (BIKTARVY) 50-200-25 MG TABS tablet Take 1 tablet by mouth daily. 01/28/24   Gary [name], Gary Phoenix, MD  hydrOXYzine (VISTARIL) 25 MG capsule Take 1 capsule (25 mg total) by mouth 3 (three) times daily as needed. 01/28/24 02/27/24  Gary [name], Gary Phoenix, MD  levETIRAcetam (KEPPRA) 500 MG tablet Take 1 tablet (500 mg total) by mouth 2 (two) times daily. Patient not taking: Reported on 01/11/2024 11/18/23   Gary Bulla, MD  ondansetron (ZOFRAN-ODT) 4 MG disintegrating tablet Dissolve 1 tablet (4 mg total) by mouth every 8 (eight) hours as needed for nausea or vomiting. Patient not taking: Reported on 12/31/2023 12/29/23   Gary Shelter, PA  QUEtiapine (SEROQUEL) 50 MG tablet Take 1 tablet (50 mg total) by mouth 2 (two) times daily. 12/25/23   Gary Chamber, MD  sucralfate (CARAFATE) 1 g tablet Take 1 g by mouth 4 (four) times daily -  with meals and at bedtime. Patient not taking: Reported on 01/11/2024    [provider]  sucralfate (CARAFATE) 1 g tablet Take 1 tablet (1 g total) by mouth 4 (four) times daily -  with meals and at bedtime. Patient not taking:  Reported on 01/28/2024 01/19/24   Gary [name], Gary Masker, MD  famotidine (PEPCID) 20 MG tablet Take 1 tablet (20 mg total) by mouth 2 (two) times daily. Patient not taking: Reported on 06/01/2019 05/14/19 06/02/19  Gary Core, MD      Allergies    Tegretol [carbamazepine] and Caffeine    Review of Systems   Review of Systems  Gastrointestinal:  Positive for abdominal pain.    Physical Exam Updated Vital Signs BP 128/87 (BP Location: Left Arm)   Pulse 72   Temp 98.8 F (37.1 C) (Oral)   Resp (!) 21   Ht 6' (1.829 m)   Wt 61.2 kg   SpO2 100%   BMI 18.30 kg/m  Physical Exam Vitals reviewed.  Constitutional:      General: He is in acute distress.  HENT:     Head: Normocephalic and atraumatic.  Eyes:     Extraocular Movements: Extraocular movements intact.     Conjunctiva/sclera: Conjunctivae normal.     Pupils: Pupils are equal, round, and reactive to light.  Cardiovascular:     Rate and Rhythm: Normal rate and regular rhythm.     Pulses: Normal pulses.     Heart sounds: Normal heart sounds.     Comments: 2+ bilateral radial/dorsalis pedis pulses with regular rate Pulmonary:     Effort: Pulmonary effort is normal. No respiratory distress.     Breath sounds: Normal breath sounds.  Abdominal:     Palpations: Abdomen  is soft.     Tenderness: There is abdominal tenderness in the epigastric area. There is no guarding or rebound. Negative signs include Murphy's sign, Rovsing's sign, McBurney's sign and psoas sign.  Musculoskeletal:        General: Normal range of motion.     Cervical back: Normal range of motion and neck supple.     Comments: 5 out of 5 bilateral grip/leg extension strength  Skin:    General: Skin is warm and dry.     Capillary Refill: Capillary refill takes less than 2 seconds.  Neurological:     General: No focal deficit present.     Mental Status: He is alert and oriented to person, place, and time.     Comments: Sensation intact in all 4 limbs   Psychiatric:        Mood and Affect: Mood normal.     ED Results / Procedures / Treatments   Labs (all labs ordered are listed, but only abnormal results are displayed) Labs Reviewed  LIPASE, BLOOD - Abnormal; Notable for the following components:      Result Value   Lipase 607 (*)    All other components within normal limits  COMPREHENSIVE METABOLIC PANEL - Abnormal; Notable for the following components:   Glucose, Bld 103 (*)    All other components within normal limits  CBC - Abnormal; Notable for the following components:   RBC 3.87 (*)    Hemoglobin 12.0 (*)    HCT 35.9 (*)    All other components within normal limits  ETHANOL - Abnormal; Notable for the following components:   Alcohol, Ethyl (B) 45 (*)    All other components within normal limits  URINALYSIS, ROUTINE W REFLEX MICROSCOPIC    EKG None  Radiology No results found.  Procedures Procedures    Medications Ordered in ED Medications  morphine (PF) 4 MG/ML injection 4 mg (has no administration in time range)  LORazepam (ATIVAN) injection 0-4 mg (has no administration in time range)    Or  LORazepam (ATIVAN) tablet 0-4 mg (has no administration in time range)  LORazepam (ATIVAN) injection 0-4 mg (has no administration in time range)    Or  LORazepam (ATIVAN) tablet 0-4 mg (has no administration in time range)  thiamine (VITAMIN B1) tablet 100 mg (has no administration in time range)    Or  thiamine (VITAMIN B1) injection 100 mg (has no administration in time range)  ondansetron (ZOFRAN-ODT) disintegrating tablet 4 mg (4 mg Oral Given 02/02/24 1837)  LORazepam (ATIVAN) tablet 1 mg (1 mg Oral Given 02/02/24 1837)    ED Course/ Medical Decision Making/ A&P                                 Medical Decision Making Amount and/or Complexity of Data Reviewed Labs: ordered.  Risk OTC drugs. Prescription drug management. Decision regarding hospitalization.   Gary [name] 38 y.o. presented today  for abdominal pain.  Working DDx that I considered at this time includes, but not limited to, gastroenteritis, colitis, small bowel obstruction, appendicitis, cholecystitis, hepatobiliary pathology, gastritis, PUD, ACS, aortic dissection, diverticulosis/diverticulitis, pancreatitis, nephrolithiasis, medication induced, AAA, UTI, pyelonephritis, testicular torsion.  R/o DDx: gastroenteritis, colitis, small bowel obstruction, appendicitis, cholecystitis, hepatobiliary pathology, gastritis, PUD, ACS, aortic dissection, diverticulosis/diverticulitis, nephrolithiasis, medication induced, AAA, UTI, pyelonephritis, testicular torsion: These are considered less likely due to history of present illness, physical exam, labs/imaging findings.  Review of prior external notes: 01/28/2024 office visit  Unique Tests and My Independent Interpretation:  CBC with differential: Unremarkable CMP: Unremarkable Lipase: 607 Ethanol: 45 EKG: Rate, rhythm, axis, intervals all examined and without medically relevant abnormality. ST segments without concerns for elevations CT Abd/Pelvis with contrast: Pending  Social Determinants of Health: EtOH/Substance Abuse  Discussion with Independent Historian: None  Discussion of Management of Tests:  Rathore, MD Hospitalist  Risk: High: hospitalization or escalation of hospital-level care  Risk Stratification Score: None  Plan: On exam patient was acute distress with stable vitals.  Patient does have epigastric tenderness without peritoneal signs.  Labs in triage do show significant increase in his lipase up to 6070.  Patient does have ethanol on board as well at 45.  Patient does not have any signs of alcohol withdrawal at this time however we will give Ativan as it has been roughly 24 hours since last drink and does have history of alcohol withdrawal.  Patient given morphine and this did not seem to help with his pain.  Given ongoing pain and increase in lipase will consult  hospitalist for admission.  I spoke to the hospitalist and they recommend a CT scan as patient is had complicated past with his pancreatitis and so this was ordered.  Patient was accepted for admission.  This chart was dictated using voice recognition software.  Despite best efforts to proofread,  errors can occur which can change the documentation meaning.         Final Clinical Impression(s) / ED Diagnoses Final diagnoses:  Alcohol-induced chronic pancreatitis [facility])    Rx / DC Orders ED Discharge Orders     None         Remi Deter 02/02/24 2256    Rondel Baton, MD 02/03/24 1137

## 2024-02-02 NOTE — ED Provider Triage Note (Signed)
 Emergency Medicine Provider Triage Evaluation Note  Gary [name] , a 38 y.o. male  was evaluated in triage.  Pt complains of abdominal pain, nausea, vomiitng, hx of etoh abuse, hx of pancreatitis, also concerned for withdrawals, drinks 1/5 per day of vodka. Reports last drink 24 hours pta. Hx of withdrawal seizures..  Review of Systems  Positive: Abdominal pain, nausea, vomiting Negative:   Physical Exam  BP 103/73   Pulse 81   Temp 98.1 F (36.7 C) (Oral)   Resp (!) 24   Ht 6' (1.829 m)   Wt 61.2 kg   SpO2 100%   BMI 18.30 kg/m  Gen:   Awake, no distress   Resp:  Normal effort  MSK:   Moves extremities without difficulty  Other:  Ttp in epigastric region throughout  Medical Decision Making  Medically screening exam initiated at 6:33 PM.  Appropriate orders placed.  Gary [name] was informed that the remainder of the evaluation will be completed by another provider, this initial triage assessment does not replace that evaluation, and the importance of remaining in the ED until their evaluation is complete.  Workup initiated in triage    Olene Floss, [place] 02/02/24 1610

## 2024-02-02 NOTE — ED Triage Notes (Signed)
 Pt arrived via [facility] from home for c/o generalized abd pain, N/V started today. Pt last ETOH was yesterday "daylight some time."

## 2024-02-03 ENCOUNTER — Other Ambulatory Visit: Payer: Self-pay

## 2024-02-03 ENCOUNTER — Inpatient Hospital Stay (HOSPITAL_COMMUNITY): Payer: MEDICAID

## 2024-02-03 DIAGNOSIS — B20 Human immunodeficiency virus [HIV] disease: Secondary | ICD-10-CM

## 2024-02-03 DIAGNOSIS — F17210 Nicotine dependence, cigarettes, uncomplicated: Secondary | ICD-10-CM | POA: Diagnosis present

## 2024-02-03 DIAGNOSIS — F3181 Bipolar II disorder: Secondary | ICD-10-CM | POA: Diagnosis present

## 2024-02-03 DIAGNOSIS — E876 Hypokalemia: Secondary | ICD-10-CM | POA: Diagnosis present

## 2024-02-03 DIAGNOSIS — F1020 Alcohol dependence, uncomplicated: Secondary | ICD-10-CM

## 2024-02-03 DIAGNOSIS — F10239 Alcohol dependence with withdrawal, unspecified: Secondary | ICD-10-CM | POA: Diagnosis present

## 2024-02-03 DIAGNOSIS — Y902 Blood alcohol level of 40-59 mg/100 ml: Secondary | ICD-10-CM | POA: Diagnosis present

## 2024-02-03 DIAGNOSIS — K852 Alcohol induced acute pancreatitis without necrosis or infection: Secondary | ICD-10-CM | POA: Diagnosis present

## 2024-02-03 DIAGNOSIS — Z5900 Homelessness unspecified: Secondary | ICD-10-CM | POA: Diagnosis not present

## 2024-02-03 DIAGNOSIS — G40909 Epilepsy, unspecified, not intractable, without status epilepticus: Secondary | ICD-10-CM | POA: Diagnosis present

## 2024-02-03 DIAGNOSIS — Z765 Malingerer [conscious simulation]: Secondary | ICD-10-CM | POA: Diagnosis not present

## 2024-02-03 DIAGNOSIS — N400 Enlarged prostate without lower urinary tract symptoms: Secondary | ICD-10-CM | POA: Diagnosis present

## 2024-02-03 DIAGNOSIS — F209 Schizophrenia, unspecified: Secondary | ICD-10-CM | POA: Diagnosis present

## 2024-02-03 DIAGNOSIS — F419 Anxiety disorder, unspecified: Secondary | ICD-10-CM | POA: Diagnosis present

## 2024-02-03 DIAGNOSIS — F4310 Post-traumatic stress disorder, unspecified: Secondary | ICD-10-CM | POA: Diagnosis present

## 2024-02-03 DIAGNOSIS — F10220 Alcohol dependence with intoxication, uncomplicated: Secondary | ICD-10-CM | POA: Diagnosis present

## 2024-02-03 DIAGNOSIS — G928 Other toxic encephalopathy: Secondary | ICD-10-CM | POA: Diagnosis present

## 2024-02-03 DIAGNOSIS — I700 Atherosclerosis of aorta: Secondary | ICD-10-CM | POA: Diagnosis present

## 2024-02-03 DIAGNOSIS — Z87820 Personal history of traumatic brain injury: Secondary | ICD-10-CM | POA: Diagnosis not present

## 2024-02-03 DIAGNOSIS — I81 Portal vein thrombosis: Secondary | ICD-10-CM

## 2024-02-03 DIAGNOSIS — I871 Compression of vein: Secondary | ICD-10-CM

## 2024-02-03 DIAGNOSIS — E538 Deficiency of other specified B group vitamins: Secondary | ICD-10-CM | POA: Diagnosis present

## 2024-02-03 DIAGNOSIS — K760 Fatty (change of) liver, not elsewhere classified: Secondary | ICD-10-CM | POA: Diagnosis present

## 2024-02-03 DIAGNOSIS — Z811 Family history of alcohol abuse and dependence: Secondary | ICD-10-CM | POA: Diagnosis not present

## 2024-02-03 DIAGNOSIS — R1013 Epigastric pain: Secondary | ICD-10-CM | POA: Diagnosis present

## 2024-02-03 DIAGNOSIS — E43 Unspecified severe protein-calorie malnutrition: Secondary | ICD-10-CM | POA: Diagnosis present

## 2024-02-03 DIAGNOSIS — Z681 Body mass index (BMI) 19 or less, adult: Secondary | ICD-10-CM | POA: Diagnosis not present

## 2024-02-03 LAB — URINALYSIS, ROUTINE W REFLEX MICROSCOPIC
Bilirubin Urine: NEGATIVE
Glucose, UA: NEGATIVE mg/dL
Hgb urine dipstick: NEGATIVE
Ketones, ur: NEGATIVE mg/dL
Leukocytes,Ua: NEGATIVE
Nitrite: NEGATIVE
Protein, ur: NEGATIVE mg/dL
Specific Gravity, Urine: 1.043 — ABNORMAL HIGH (ref 1.005–1.030)
pH: 9 — ABNORMAL HIGH (ref 5.0–8.0)

## 2024-02-03 LAB — CBC
HCT: 32.4 % — ABNORMAL LOW (ref 39.0–52.0)
Hemoglobin: 10.7 g/dL — ABNORMAL LOW (ref 13.0–17.0)
MCH: 30.3 pg (ref 26.0–34.0)
MCHC: 33 g/dL (ref 30.0–36.0)
MCV: 91.8 fL (ref 80.0–100.0)
Platelets: 175 10*3/uL (ref 150–400)
RBC: 3.53 MIL/uL — ABNORMAL LOW (ref 4.22–5.81)
RDW: 14.2 % (ref 11.5–15.5)
WBC: 4.6 10*3/uL (ref 4.0–10.5)
nRBC: 0 % (ref 0.0–0.2)

## 2024-02-03 LAB — COMPREHENSIVE METABOLIC PANEL WITH GFR
ALT: 15 U/L (ref 0–44)
AST: 18 U/L (ref 15–41)
Albumin: 3.1 g/dL — ABNORMAL LOW (ref 3.5–5.0)
Alkaline Phosphatase: 50 U/L (ref 38–126)
Anion gap: 6 (ref 5–15)
BUN: 5 mg/dL — ABNORMAL LOW (ref 6–20)
CO2: 24 mmol/L (ref 22–32)
Calcium: 8.6 mg/dL — ABNORMAL LOW (ref 8.9–10.3)
Chloride: 105 mmol/L (ref 98–111)
Creatinine, Ser: 0.49 mg/dL — ABNORMAL LOW (ref 0.61–1.24)
GFR, Estimated: >60 mL/min (ref >60)
Glucose, Bld: 112 mg/dL — ABNORMAL HIGH (ref 70–99)
Potassium: 3.4 mmol/L — ABNORMAL LOW (ref 3.5–5.1)
Sodium: 135 mmol/L (ref 135–145)
Total Bilirubin: 0.6 mg/dL (ref 0.0–1.2)
Total Protein: 5.7 g/dL — ABNORMAL LOW (ref 6.5–8.1)

## 2024-02-03 LAB — RAPID URINE DRUG SCREEN, HOSP PERFORMED
Amphetamines: NOT DETECTED
Barbiturates: NOT DETECTED
Benzodiazepines: POSITIVE — AB
Cocaine: NOT DETECTED
Opiates: POSITIVE — AB
Tetrahydrocannabinol: NOT DETECTED

## 2024-02-03 LAB — AMMONIA: Ammonia: 29 umol/L (ref 9–35)

## 2024-02-03 LAB — HEPARIN LEVEL (UNFRACTIONATED)
Heparin Unfractionated: 0.56 [IU]/mL (ref 0.30–0.70)
Heparin Unfractionated: <0.1 [IU]/mL — ABNORMAL LOW (ref 0.30–0.70)

## 2024-02-03 LAB — LIPASE, BLOOD: Lipase: 728 U/L — ABNORMAL HIGH (ref 11–51)

## 2024-02-03 LAB — PHOSPHORUS: Phosphorus: 3.5 mg/dL (ref 2.5–4.6)

## 2024-02-03 LAB — MAGNESIUM: Magnesium: 1.7 mg/dL (ref 1.7–2.4)

## 2024-02-03 LAB — VITAMIN B12: Vitamin B-12: 267 pg/mL (ref 180–914)

## 2024-02-03 LAB — TSH: TSH: 1.083 u[IU]/mL (ref 0.350–4.500)

## 2024-02-03 MED ORDER — LORAZEPAM 2 MG/ML IJ SOLN
1.0000 mg | INTRAMUSCULAR | Status: DC | PRN
  Administered 2024-02-03 (×3): 2 mg via INTRAVENOUS
  Administered 2024-02-03: 1 mg via INTRAVENOUS
  Administered 2024-02-04: 3 mg via INTRAVENOUS
  Administered 2024-02-04: 2 mg via INTRAVENOUS
  Filled 2024-02-03: qty 1
  Filled 2024-02-03: qty 2
  Filled 2024-02-03 (×4): qty 1

## 2024-02-03 MED ORDER — ADULT MULTIVITAMIN W/MINERALS CH
1.0000 | ORAL_TABLET | Freq: Every day | ORAL | Status: DC
Start: 2024-02-03 — End: 2024-02-04
  Administered 2024-02-03 – 2024-02-04 (×2): 1 via ORAL
  Filled 2024-02-03 (×2): qty 1

## 2024-02-03 MED ORDER — LORAZEPAM 1 MG PO TABS
1.0000 mg | ORAL_TABLET | ORAL | Status: DC | PRN
  Administered 2024-02-03: 1 mg via ORAL
  Filled 2024-02-03: qty 1

## 2024-02-03 MED ORDER — MAGNESIUM SULFATE 2 GM/50ML IV SOLN
2.0000 g | Freq: Once | INTRAVENOUS | Status: AC
  Administered 2024-02-03: 2 g via INTRAVENOUS
  Filled 2024-02-03: qty 50

## 2024-02-03 MED ORDER — POLYETHYLENE GLYCOL 3350 17 G PO PACK: 17.0000 g | PACK | Freq: Two times a day (BID) | ORAL | Status: DC | PRN

## 2024-02-03 MED ORDER — PANTOPRAZOLE SODIUM 40 MG PO TBEC
40.0000 mg | DELAYED_RELEASE_TABLET | Freq: Every day | ORAL | Status: DC
  Administered 2024-02-03 – 2024-02-04 (×2): 40 mg via ORAL
  Filled 2024-02-03 (×2): qty 1

## 2024-02-03 MED ORDER — MORPHINE SULFATE (PF) 2 MG/ML IV SOLN
1.0000 mg | INTRAVENOUS | Status: DC | PRN
  Administered 2024-02-03 (×4): 1 mg via INTRAVENOUS
  Filled 2024-02-03 (×4): qty 1

## 2024-02-03 MED ORDER — MORPHINE SULFATE (PF) 2 MG/ML IV SOLN
1.0000 mg | INTRAVENOUS | Status: DC | PRN
  Administered 2024-02-03: 1 mg via INTRAVENOUS
  Filled 2024-02-03: qty 1

## 2024-02-03 MED ORDER — MORPHINE SULFATE (PF) 2 MG/ML IV SOLN
1.0000 mg | INTRAVENOUS | Status: DC | PRN
  Administered 2024-02-03 – 2024-02-04 (×3): 2 mg via INTRAVENOUS
  Filled 2024-02-03 (×3): qty 1

## 2024-02-03 MED ORDER — BISACODYL 10 MG RE SUPP: 10.0000 mg | Freq: Once | RECTAL | Status: DC

## 2024-02-03 MED ORDER — ACETAMINOPHEN 650 MG RE SUPP: 650.0000 mg | Freq: Four times a day (QID) | RECTAL | Status: DC | PRN

## 2024-02-03 MED ORDER — FOLIC ACID 1 MG PO TABS
1.0000 mg | ORAL_TABLET | Freq: Every day | ORAL | Status: DC
  Administered 2024-02-03 – 2024-02-04 (×2): 1 mg via ORAL
  Filled 2024-02-03 (×2): qty 1

## 2024-02-03 MED ORDER — POTASSIUM CHLORIDE CRYS ER 20 MEQ PO TBCR
40.0000 meq | EXTENDED_RELEASE_TABLET | ORAL | Status: AC
  Administered 2024-02-03 (×2): 40 meq via ORAL
  Filled 2024-02-03 (×2): qty 2

## 2024-02-03 MED ORDER — POLYETHYLENE GLYCOL 3350 17 G PO PACK
17.0000 g | PACK | Freq: Two times a day (BID) | ORAL | Status: AC
  Administered 2024-02-03 (×2): 17 g via ORAL
  Filled 2024-02-03 (×2): qty 1

## 2024-02-03 MED ORDER — BISACODYL 10 MG RE SUPP: 10.0000 mg | Freq: Every day | RECTAL | Status: DC | PRN

## 2024-02-03 MED ORDER — SODIUM CHLORIDE 0.9 % IV SOLN: INTRAVENOUS | Status: AC

## 2024-02-03 MED ORDER — HEPARIN (PORCINE) 25000 UT/250ML-% IV SOLN
1400.0000 [IU]/h | INTRAVENOUS | Status: DC
  Administered 2024-02-03: 1000 [IU]/h via INTRAVENOUS
  Administered 2024-02-04: 1200 [IU]/h via INTRAVENOUS
  Filled 2024-02-03 (×2): qty 250

## 2024-02-03 MED ORDER — ACETAMINOPHEN 325 MG PO TABS
650.0000 mg | ORAL_TABLET | Freq: Four times a day (QID) | ORAL | Status: DC | PRN
  Filled 2024-02-03: qty 2

## 2024-02-03 MED ORDER — ONDANSETRON HCL 4 MG/2ML IJ SOLN: 4.0000 mg | Freq: Four times a day (QID) | INTRAMUSCULAR | Status: DC | PRN

## 2024-02-03 MED ORDER — LEVETIRACETAM 500 MG PO TABS
500.0000 mg | ORAL_TABLET | Freq: Two times a day (BID) | ORAL | Status: DC
  Administered 2024-02-03 – 2024-02-04 (×4): 500 mg via ORAL
  Filled 2024-02-03 (×4): qty 1

## 2024-02-03 MED ORDER — HEPARIN BOLUS VIA INFUSION
4000.0000 [IU] | Freq: Once | INTRAVENOUS | Status: AC
  Administered 2024-02-03: 4000 [IU] via INTRAVENOUS
  Filled 2024-02-03: qty 4000

## 2024-02-03 MED ORDER — BICTEGRAVIR-EMTRICITAB-TENOFOV 50-200-25 MG PO TABS
1.0000 | ORAL_TABLET | Freq: Every day | ORAL | Status: DC
  Administered 2024-02-03 – 2024-02-04 (×2): 1 via ORAL
  Filled 2024-02-03 (×3): qty 1

## 2024-02-03 MED ORDER — NALOXONE HCL 0.4 MG/ML IJ SOLN: 0.4000 mg | INTRAMUSCULAR | Status: DC | PRN

## 2024-02-03 NOTE — H&P (Signed)
 History and Physical    Gary GEISSINGER WUJ:[id number] DOB: Jan 06, 1986 DOA: 02/02/2024  PCP: Pcp, No  Patient coming from: Home  Chief Complaint: Abdominal pain, vomiting  HPI: Gary [name] is a 38 y.o. male with medical history significant of alcohol use disorder (severe, dependence), recurrent alcoholic pancreatitis, HIV, depression, anxiety, bipolar disorder, schizophrenia, PTSD, history of alcohol withdrawal and nonwithdrawal seizures, history of small subdural hematoma secondary to head trauma in August 2020, hepatic steatosis, tobacco/polysubstance abuse presented to ED with complaints of epigastric abdominal pain, nausea, and vomiting in the setting of ongoing alcohol abuse.  Vital signs stable, afebrile.  Labs showing no leukocytosis, hemoglobin 12.0 (stable), platelet count normal, normal LFTs, lipase 607, ethanol level 45, UA pending.    CT abdomen pelvis with contrast showing: "IMPRESSION: 1. Interval worsening acute pancreatitis. 2. 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 Underlying thrombus not excluded. 3. Small bowel loops within the lower anterior abdomen dilated with air and fluid measuring up to 3.7 cm in caliber. No definite transition point identified. Finding could represent ileus versus partial small bowel obstruction. 4. Prostatomegaly. 5.  Aortic Atherosclerosis (ICD10-I70.0)."  Patient was given Ativan, morphine, Zofran, and 1 L normal saline.  [facility] called to admit.  Patient is reporting epigastric abdominal pain, nausea, and vomiting for several days.  Symptoms have been worse for the past 2 days, reporting severe epigastric pain.  He reports ongoing alcohol abuse, drinks 1/5 of vodka every day.  Last drink was over 24 hours ago.  He reports having regular bowel movements and last bowel movement was yesterday morning.  Review of Systems:  Review of Systems  All other  systems reviewed and are negative.   Past Medical History:  Diagnosis Date   Alcohol abuse    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   Anxiety    Bipolar 2 disorder (HCC)    HIV (human immunodeficiency virus infection) (HCC)    Pancreatitis    PTSD (post-traumatic stress disorder)    Schizophrenia (HCC)    Seizures (HCC)    Subdural hematoma (HCC)     Past Surgical History:  Procedure Laterality Date   BIOPSY  04/19/2022   Procedure: BIOPSY;  Surgeon: Lemar Lofty., MD;  Location: [facility] ENDOSCOPY;  Service: Gastroenterology;;   ENTEROSCOPY N/A 04/19/2022   Procedure: ENTEROSCOPY;  Surgeon: Lemar Lofty., MD;  Location: [facility] ENDOSCOPY;  Service: Gastroenterology;  Laterality: N/A;   INCISION AND DRAINAGE PERIRECTAL ABSCESS N/A 09/24/2016   Procedure: IRRIGATION AND DEBRIDEMENT PERIRECTAL ABSCESS;  Surgeon: Ricarda Frame, MD;  Location: [facility] ORS;  Service: General;  Laterality: N/A;   none       reports that he has been smoking cigarettes. He started smoking about 21 years ago. He has a 10.6 pack-year smoking history. He has never used smokeless tobacco. He reports current alcohol use of about 105.0 standard drinks of alcohol per week. He reports that he does not currently use drugs after having used the following drugs: Methamphetamines and Cocaine.  Allergies  Allergen Reactions   Tegretol [Carbamazepine] Other (See Comments)    Vertigo  Paralysis   Caffeine Palpitations    Family History  Problem Relation Age of Onset   Alcohol abuse Mother    Alcohol abuse Father    Colon cancer Other    Other Other    Cancer Other     Prior to Admission medications   Medication Sig Start Date  End Date Taking? Authorizing Provider  bictegravir-emtricitabine-tenofovir AF (BIKTARVY) 50-200-25 MG TABS tablet Take 1 tablet by mouth daily. 01/28/24  Yes Vu, Tonita Phoenix, MD  QUEtiapine (SEROQUEL) 50 MG tablet Take 1 tablet (50 mg total) by mouth 2 (two) times daily. Patient  taking differently: Take 100 mg by mouth at bedtime. For anxiety and sleep 12/25/23  Yes Lewie Chamber, MD  hydrOXYzine (VISTARIL) 25 MG capsule Take 1 capsule (25 mg total) by mouth 3 (three) times daily as needed. Patient not taking: Reported on 02/02/2024 01/28/24 02/27/24  Rutha Bouchard T, MD  levETIRAcetam (KEPPRA) 500 MG tablet Take 1 tablet (500 mg total) by mouth 2 (two) times daily. Patient not taking: Reported on 02/02/2024 11/18/23   Gunnar Bulla, MD  ondansetron (ZOFRAN-ODT) 4 MG disintegrating tablet Dissolve 1 tablet (4 mg total) by mouth every 8 (eight) hours as needed for nausea or vomiting. Patient not taking: Reported on 12/31/2023 12/29/23   Gailen Shelter, PA  sucralfate (CARAFATE) 1 g tablet Take 1 g by mouth 4 (four) times daily -  with meals and at bedtime. Patient not taking: Reported on 01/11/2024    [provider]  sucralfate (CARAFATE) 1 g tablet Take 1 tablet (1 g total) by mouth 4 (four) times daily -  with meals and at bedtime. Patient not taking: Reported on 01/28/2024 01/19/24   Horton, Mayer Masker, MD  famotidine (PEPCID) 20 MG tablet Take 1 tablet (20 mg total) by mouth 2 (two) times daily. Patient not taking: Reported on 06/01/2019 05/14/19 06/02/19  Benjiman Core, MD    Physical Exam: Vitals:   02/02/24 2102 02/02/24 2304 02/02/24 2307 02/03/24 0030  BP: 128/87 111/84 111/84 112/77  Pulse: 72 86 86 75  Resp: (!) 21  18 18   Temp: 98.8 F (37.1 C)  99 F (37.2 C) 98.2 F (36.8 C)  TempSrc: Oral  Oral Oral  SpO2: 100%  99% 100%  Weight:      Height:        Physical Exam Vitals reviewed.  Constitutional:      General: He is not in acute distress. HENT:     Head: Normocephalic and atraumatic.  Eyes:     Extraocular Movements: Extraocular movements intact.  Cardiovascular:     Rate and Rhythm: Normal rate and regular rhythm.     Pulses: Normal pulses.  Pulmonary:     Effort: Pulmonary effort is normal. No respiratory distress.     Breath sounds:  Normal breath sounds. No wheezing or rales.  Abdominal:     General: Bowel sounds are normal. There is no distension.     Palpations: Abdomen is soft.     Tenderness: There is abdominal tenderness. There is no guarding.     Comments: Epigastrium tender to palpation with guarding  Musculoskeletal:     Cervical back: Normal range of motion.     Right lower leg: No edema.     Left lower leg: No edema.  Skin:    General: Skin is warm and dry.  Neurological:     General: No focal deficit present.     Mental Status: He is alert and oriented to person, place, and time.     Labs on Admission: I have personally reviewed following labs and imaging studies  CBC: Recent Labs  Lab 02/02/24 1821  WBC 4.2  HGB 12.0*  HCT 35.9*  MCV 92.8  PLT 212   Basic Metabolic Panel: Recent Labs  Lab 02/02/24 1821  NA 142  K 3.7  CL 109  CO2 27  GLUCOSE 103*  BUN 8  CREATININE 0.61  CALCIUM 9.3   GFR: Estimated Creatinine Clearance: 109.4 mL/min (by C-G formula based on SCr of 0.61 mg/dL). Liver Function Tests: Recent Labs  Lab 02/02/24 1821  AST 19  ALT 17  ALKPHOS 54  BILITOT 0.4  PROT 6.6  ALBUMIN 3.8   Recent Labs  Lab 02/02/24 1821  LIPASE 607*   No results for input(s): "AMMONIA" in the last 168 hours. Coagulation Profile: No results for input(s): "INR", "PROTIME" in the last 168 hours. Cardiac Enzymes: No results for input(s): "CKTOTAL", "CKMB", "CKMBINDEX", "TROPONINI" in the last 168 hours. BNP (last 3 results) No results for input(s): "PROBNP" in the last 8760 hours. HbA1C: No results for input(s): "HGBA1C" in the last 72 hours. CBG: No results for input(s): "GLUCAP" in the last 168 hours. Lipid Profile: No results for input(s): "CHOL", "HDL", "LDLCALC", "TRIG", "CHOLHDL", "LDLDIRECT" in the last 72 hours. Thyroid Function Tests: No results for input(s): "TSH", "T4TOTAL", "FREET4", "T3FREE", "THYROIDAB" in the last 72 hours. Anemia Panel: No results for  input(s): "VITAMINB12", "FOLATE", "FERRITIN", "TIBC", "IRON", "RETICCTPCT" in the last 72 hours. Urine analysis:    Component Value Date/Time   COLORURINE YELLOW 01/19/2024 0500   APPEARANCEUR CLEAR 01/19/2024 0500   APPEARANCEUR Clear 11/15/2014 1755   LABSPEC 1.015 01/19/2024 0500   LABSPEC 1.002 11/15/2014 1755   PHURINE 6.0 01/19/2024 0500   GLUCOSEU NEGATIVE 01/19/2024 0500   GLUCOSEU Negative 11/15/2014 1755   HGBUR NEGATIVE 01/19/2024 0500   BILIRUBINUR NEGATIVE 01/19/2024 0500   BILIRUBINUR Negative 11/15/2014 1755   KETONESUR NEGATIVE 01/19/2024 0500   PROTEINUR NEGATIVE 01/19/2024 0500   NITRITE NEGATIVE 01/19/2024 0500   LEUKOCYTESUR LARGE (A) 01/19/2024 0500   LEUKOCYTESUR Negative 11/15/2014 1755    Radiological Exams on Admission: CT ABDOMEN PELVIS W CONTRAST Result Date: 02/03/2024 CLINICAL DATA:  Pancreatitis, acute, severe EXAM: CT ABDOMEN AND PELVIS WITH CONTRAST TECHNIQUE: Multidetector CT imaging of the abdomen and pelvis was performed using the standard protocol following bolus administration of intravenous contrast. RADIATION DOSE REDUCTION: This exam was performed according to the departmental dose-optimization program which includes automated exposure control, adjustment of the mA and/or kV according to patient size and/or use of iterative reconstruction technique. CONTRAST:  75mL OMNIPAQUE IOHEXOL 350 MG/ML SOLN COMPARISON:  CT abdomen pelvis 12/17/2023, CT abdomen pelvis 11/25/2023, CT abdomen pelvis 10/28/2023, CT abdomen pelvis 08/22/2023, CT abdomen pelvis 05/24/2023 FINDINGS: Lower chest: No acute abnormality. Hepatobiliary: No focal liver abnormality. No gallstones, gallbladder wall thickening, or pericholecystic fluid. No biliary dilatation. Pancreas: Grossly stable 1.6 cm proximal pancreas fluid dense lesion (3:24). Normal pancreatic contour. Interval increase in proximal pancreas peripancreatic free fluid with mass effect on the proximal portal vein with  focal marked stenosis (3:22). No main pancreatic ductal dilatation. Spleen: Normal in size without focal abnormality. Adrenals/Urinary Tract: No adrenal nodule bilaterally. Bilateral kidneys enhance symmetrically. No hydronephrosis. No hydroureter. The urinary bladder is unremarkable. Stomach/Bowel: Stomach is within normal limits. Small bowel loops within the lower anterior abdomen dilated with air and fluid measuring up to 3.7 cm in caliber. No definite transition point identified. No evidence of large bowel wall thickening or dilatation. Appendix appears normal. Vascular/Lymphatic: No abdominal aorta or iliac aneurysm. No abdominal, pelvic, or inguinal lymphadenopathy. Reproductive: Prostate enlarged measuring 4.9 cm. Other: Upper abdomen simple free fluid. No intraperitoneal free gas. No organized fluid collection. Musculoskeletal: No abdominal wall hernia or abnormality. No suspicious lytic or blastic osseous lesions.  No acute displaced fracture. IMPRESSION: 1. Interval worsening acute pancreatitis. 2. 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 Underlying thrombus not excluded. 3. Small bowel loops within the lower anterior abdomen dilated with air and fluid measuring up to 3.7 cm in caliber. No definite transition point identified. Finding could represent ileus versus partial small bowel obstruction. 4. Prostatomegaly. 5.  Aortic Atherosclerosis (ICD10-I70.0). Electronically Signed   By: Tish Frederickson M.D.   On: 02/03/2024 00:00    Assessment and Plan  Recurrent alcoholic pancreatitis In the setting of ongoing alcohol abuse.  Lipase 607.  CT showing interval worsening acute pancreatitis.  Keep n.p.o. except sips with meds.  Continue IV fluid hydration, pain management, and antiemetic as needed for nausea/vomiting.  EKG ordered to check QT interval.  Trend lipase.  Possible portal vein thrombosis CT showing  grossly stable 1.6 cm proximal pancreas fluid dense lesion with mass effect on the proximal portal vein with focal marked stenosis; underlying thrombus not excluded per radiologist.  Although patient denies any recent falls or head injury and no obvious focal neurologic deficit on exam, he is intermittently falling asleep during conversation.  Given history of previous subdural hematoma, ordered stat CT head to rule out intracranial bleed before starting IV heparin for anticoagulation.  ?Ileus versus partial SBO CT also showing small bowel loops within the lower anterior abdomen with air and fluid measuring up to 3.7 cm in caliber but no definite transition point identified.  Findings could represent ileus versus partial SBO per radiologist.  However, patient reports having regular bowel movements.  He has normal bowel sounds on exam and his abdomen is not distended.  Will hold off placing NG tube as he is not actively vomiting.  Keep n.p.o. and consult general surgery in the morning.  Alcohol use disorder, severe, dependence Last CIWA score 10.  Placed on CIWA protocol; Ativan as needed.  Thiamine, folate, and multivitamin.  HIV Last CD4 count 598 on 12/08/2023.  Continue Biktarvy.  Seizure disorder Continue Keppra.  DVT prophylaxis: SCDs Code Status: Full Code (discussed with the patient) Family Communication: No family available at this time. Level of care: Progressive Care Unit Admission status: It is my clinical opinion that admission to INPATIENT is reasonable and necessary because of the expectation that this patient will require hospital care that crosses at least 2 midnights to treat this condition based on the medical complexity of the problems presented.  Given the aforementioned information, the predictability of an adverse outcome is felt to be significant.  John Giovanni MD [facility]  If 7PM-7AM, please contact night-coverage [email or web address]  02/03/2024, 1:07 AM

## 2024-02-03 NOTE — Progress Notes (Signed)
 PHARMACY - ANTICOAGULATION CONSULT NOTE  Pharmacy Consult for heparin Indication: R/O portal vein thrombosis  Allergies  Allergen Reactions   Tegretol [Carbamazepine] Other (See Comments)    Vertigo  Paralysis   Caffeine Palpitations    Patient Measurements: Height: 6' (182.9 cm) Weight: 61.2 kg (134 lb 14.7 oz) IBW/kg (Calculated) : 77.6 HEPARIN DW (KG): 61.2  Vital Signs: Temp: 98.2 F (36.8 C) (03/27 1419) Temp Source: Oral (03/27 1419) BP: 103/73 (03/27 1419) Pulse Rate: 67 (03/27 1419)  Labs: Recent Labs    02/02/24 1821 02/03/24 0435 02/03/24 1109 02/03/24 1655  HGB 12.0* 10.7*  --   --   HCT 35.9* 32.4*  --   --   PLT 212 175  --   --   HEPARINUNFRC  --   --  0.56 <0.10*  CREATININE 0.61 0.49*  --   --     Estimated Creatinine Clearance: 109.4 mL/min (A) (by C-G formula based on SCr of 0.49 mg/dL (L)).   Medical History: Past Medical History:  Diagnosis Date   Alcohol abuse    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   Anxiety    Bipolar 2 disorder (HCC)    HIV (human immunodeficiency virus infection) (HCC)    Pancreatitis    PTSD (post-traumatic stress disorder)    Schizophrenia (HCC)    Seizures (HCC)    Subdural hematoma (HCC)    Assessment: 37 yoM presented to ED with abdominal pain/vomiting. Pharmacy  to dose heparin for possible portal vein thrombosis.  -No PTA anticoagulation -Hgb 12, plts 212  Heparin level now undetectable, no infusion issues per RN  Goal of Therapy:  Heparin level 0.3-0.7 units/ml Monitor platelets by anticoagulation protocol: Yes   Plan:  Increase heparin to 1200 units / hr Heparin level in 6 hours  Thank you. Okey Regal, PharmD 02/03/2024 5:24 PM

## 2024-02-03 NOTE — Progress Notes (Signed)
 CSW added substance abuse resources to patient's AVS.  Edwin Dada, MSW, LCSW Transitions of Care  Clinical Social Worker II [phone]

## 2024-02-03 NOTE — Plan of Care (Signed)

## 2024-02-03 NOTE — Progress Notes (Addendum)
 PHARMACY - ANTICOAGULATION CONSULT NOTE  Pharmacy Consult for heparin Indication: R/O portal vein thrombosis  Allergies  Allergen Reactions   Tegretol [Carbamazepine] Other (See Comments)    Vertigo  Paralysis   Caffeine Palpitations    Patient Measurements: Height: 6' (182.9 cm) Weight: 61.2 kg (134 lb 14.7 oz) IBW/kg (Calculated) : 77.6 HEPARIN DW (KG): 61.2  Vital Signs: Temp: 98 F (36.7 C) (03/27 0300) Temp Source: Oral (03/27 0030) BP: 110/84 (03/27 0330) Pulse Rate: 53 (03/27 0330)  Labs: Recent Labs    02/02/24 1821  HGB 12.0*  HCT 35.9*  PLT 212  CREATININE 0.61    Estimated Creatinine Clearance: 109.4 mL/min (by C-G formula based on SCr of 0.61 mg/dL).   Medical History: Past Medical History:  Diagnosis Date   Alcohol abuse    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   Anxiety    Bipolar 2 disorder (HCC)    HIV (human immunodeficiency virus infection) (HCC)    Pancreatitis    PTSD (post-traumatic stress disorder)    Schizophrenia (HCC)    Seizures (HCC)    Subdural hematoma (HCC)    Assessment: 37 yoM presented to ED with abdominal pain/vomiting. Pharmacy sonculted to dose heparin for possible portal vein thrombosis.  -No PTA anticoagulation -Hgb 12, plts 212  Goal of Therapy:  Heparin level 0.3-0.7 units/ml Monitor platelets by anticoagulation protocol: Yes   Plan:  Give 4000 units bolus x 1 Start heparin infusion at 1000 units/hr Check anti-Xa level in 6 hours and daily while on heparin Continue to monitor H&H and platelets  Arabella Merles, PharmD. Clinical Pharmacist 02/03/2024 3:43 AM   UPDATE 02/03/24 12:27 PM: Heparin level is therapeutic at 0.56 on UFH IV 1000 units/hour. No signs/symptoms of bleeding  Continue UFH IV 1000 units/hour. Check confirmatory heparin level in 6 hours Monitor daily heparin level and CBC

## 2024-02-03 NOTE — Discharge Instructions (Signed)

## 2024-02-03 NOTE — Progress Notes (Signed)
  Patient has pain medication seeking behavior.  He was initially on morphine 1 mg every 4 hour as needed which initially I have changed to 1 mg every 2 hour as needed however patient is requesting for 2 mg every 2 hour as needed.  Patient continues to ask for more pain medications and stating that he is not satisfied with the pain medication that is giving by the physician in the hospital. I have informed the RN that if patient continues to ask for more pain medications I  will not provide more as it is not clinically indicated.  Tereasa Coop, MD [facility] 02/03/2024, 11:14 PM

## 2024-02-03 NOTE — Progress Notes (Addendum)
 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  CT abdomen and pelvis showed interval worsening acute pancreatitis, grossly stable 1.6 cm proximal pancreatic fluid density lesion with mass effect on proximal portal vein with local marked stenosis, ileus versus partial SBO, prostamegaly and aortic atherosclerosis.  Patient was started on antifungal morphine, Zofran and fluid.  Also started on IV heparin due to concern for portal vein thrombosis.   Subjective: Seen and examined earlier this morning.  No major events overnight of this morning.  He is very sleepy but wakes to voice.  He is oriented to self, place and situation but not time.  Reports drinking about a bottle of vodka.  Also reports smoking about half a pack a day.  He denies recreational drug use.  He endorses epigastric pain and he rates pain 4/10.  He endorses nausea but no emesis.  No bowel movement.  Not sure about passing gas.  Objective: Vitals:   02/03/24 0900 02/03/24 1034 02/03/24 1100 02/03/24 1200  BP: 96/68 105/81 105/79 100/69  Pulse: 69 61 69 67  Resp:  17    Temp:   98.2 F (36.8 C)   TempSrc:   Oral   SpO2:  100% 100% 100%  Weight:      Height:        Examination:  GENERAL: No apparent distress.  Appears frail. HEENT: MMM.  Vision and hearing grossly intact.  NECK: Supple.  No apparent JVD.   RESP:  No IWOB.  Fair aeration bilaterally. CVS:  RRR. Heart sounds normal.  ABD/GI/GU: BS+. Abd soft.  Mild epigastric tenderness. MSK/EXT:  Moves extremities.  Significant muscle mass and subcu fat loss. SKIN: no apparent skin lesion or wound NEURO: Sleepy but wakes to voice.  Oriented to self, place and situation but not time.  Follows commands.  Falls back asleep quickly.  No apparent focal neuro deficit. PSYCH: Calm. Normal affect.   Consultants:  None  Procedures: None  Microbiology summarized: None  Assessment and plan: Recurrent alcoholic pancreatitis: Lipase 811>> 728.  CT finding as above.  -Start clear liquid diet -Continue IV fluid, antiemetics and analgesics. -Add Protonix -Encouraged alcohol cessation.  TOC consulted.  Alcohol withdrawal with severe alcohol dependence: Reports drinking a bottle of vodka a day.  EtOH level was 45 on presentation.  LFT within normal. -Continue with CIWA with as needed Ativan -Continue thiamine, folic acid and multivitamin -Continue IV fluids -Monitor electrolytes and replenish aggressively -Continue home Keppra. -TOC consulted for resources  Pancreatic lesion/portal vein stenosis: CT showed grossly stable 1.6 cm proximal pancreatic fluid density lesion with mass effect on proximal portal vein with local marked stenosis.  Radiology not able to completely exclude portal vein thrombosis. -Check liver Doppler. -Continue IV heparin for now.  Will discontinue if liver Doppler negative  Ileus versus partial SBO: Noted on CT.  Emesis resolved.  Pain improved. -MiraLAX and Dulcolax suppository -Clear liquid diet  Acute toxic metabolic encephalopathy: Likely due to alcohol withdrawal and sedatives.  He is sleepy but wakes to voice.  Oriented to self, hospital and situation but not time.  No focal neurodeficit. -Check ammonia level, B12, TSH, UDS, RPR -Reorientation and delirium precautions.   HIV: Last CD4 count 598 on 12/08/2023.    -Continue Biktarvy.   Seizure disorder -Continue Keppra.  Hypokalemia/hypomagnesemia -Monitor replenish as appropriate  Homelessness -TOC consulted  Tobacco use disorder: Reports smoking half a pack a day. -Encourage smoking cessation -Nicotine patch if desired.  Currently sleepy.  Severe malnutrition Body mass index is 18.3 kg/m. -Consult dietitian          DVT prophylaxis:  SCDs Start: 02/03/24 0208 On full dose anticoagulation Code Status: Full code Family Communication: None at bedside Level of care: Progressive Status is: Inpatient Remains inpatient appropriate because: Acute pancreatitis, alcohol withdrawal and possible ileus/SBO   Final disposition: To be determined   55 minutes with more than 50% spent in reviewing records, counseling patient/family and coordinating care.   Sch Meds:  Scheduled Meds:  bictegravir-emtricitabine-tenofovir AF  1 tablet Oral Daily   folic acid  1 mg Oral Daily   levETIRAcetam  500 mg Oral BID   multivitamin with minerals  1 tablet Oral Daily   pantoprazole  40 mg Oral Daily   thiamine  100 mg Oral Daily   Or   thiamine  100 mg Intravenous Daily   Continuous Infusions:  sodium chloride 125 mL/hr at 02/03/24 1102   heparin 1,000 Units/hr (02/03/24 0412)   magnesium sulfate bolus IVPB 2 g (02/03/24 1152)   PRN Meds:.acetaminophen **OR** acetaminophen, LORazepam **OR** LORazepam, morphine injection, naLOXone (NARCAN)  injection, ondansetron (ZOFRAN) IV  Antimicrobials: Anti-infectives (From admission, onward)    Start     Dose/Rate Route Frequency Ordered Stop   02/03/24 1000  bictegravir-emtricitabine-tenofovir AF (BIKTARVY) 50-200-25 MG per tablet 1 tablet        1 tablet Oral Daily 02/03/24 0209          I have personally reviewed the following labs and images: CBC: Recent Labs  Lab 02/02/24 1821 02/03/24 0435  WBC 4.2 4.6  HGB 12.0* 10.7*  HCT 35.9* 32.4*  MCV 92.8 91.8  PLT 212 175   BMP  &GFR Recent Labs  Lab 02/02/24 1821 02/03/24 0435  NA 142 135  K 3.7 3.4*  CL 109 105  CO2 27 24  GLUCOSE 103* 112*  BUN 8 5*  CREATININE 0.61 0.49*  CALCIUM 9.3 8.6*  MG  --  1.7  PHOS  --  3.5   Estimated Creatinine Clearance: 109.4 mL/min (A) (by C-G formula based on SCr of 0.49 mg/dL (L)). Liver & Pancreas: Recent Labs  Lab 02/02/24 1821 02/03/24 0435  AST 19 18  ALT 17 15  ALKPHOS 54 50  BILITOT 0.4 0.6  PROT 6.6 5.7*  ALBUMIN 3.8 3.1*   Recent Labs  Lab 02/02/24 1821 02/03/24 0435  LIPASE 607* 728*   No results for input(s): "AMMONIA" in the last 168 hours. Diabetic: No results for input(s): "HGBA1C" in the last 72 hours. No results for input(s): "GLUCAP" in the last 168 hours. Cardiac Enzymes: No results for input(s): "CKTOTAL", "CKMB", "CKMBINDEX", "TROPONINI" in the last 168 hours. No results for input(s): "PROBNP" in the last 8760 hours. Coagulation Profile: No results for input(s): "INR", "PROTIME" in the last 168 hours. Thyroid Function Tests: No results for input(s): "TSH", "T4TOTAL", "FREET4", "T3FREE", "THYROIDAB" in the last 72 hours. Lipid Profile:  No results for input(s): "CHOL", "HDL", "LDLCALC", "TRIG", "CHOLHDL", "LDLDIRECT" in the last 72 hours. Anemia Panel: No results for input(s): "VITAMINB12", "FOLATE", "FERRITIN", "TIBC", "IRON", "RETICCTPCT" in the last 72 hours. Urine analysis:    Component Value Date/Time   COLORURINE YELLOW 02/03/2024 0105   APPEARANCEUR CLEAR 02/03/2024 0105   APPEARANCEUR Clear 11/15/2014 1755   LABSPEC 1.043 (H) 02/03/2024 0105   LABSPEC 1.002 11/15/2014 1755   PHURINE 9.0 (H) 02/03/2024 0105   GLUCOSEU NEGATIVE 02/03/2024 0105   GLUCOSEU Negative 11/15/2014 1755   HGBUR NEGATIVE 02/03/2024 0105   BILIRUBINUR NEGATIVE 02/03/2024 0105   BILIRUBINUR Negative 11/15/2014 1755   KETONESUR NEGATIVE 02/03/2024 0105   PROTEINUR NEGATIVE 02/03/2024 0105   NITRITE NEGATIVE 02/03/2024 0105   LEUKOCYTESUR  NEGATIVE 02/03/2024 0105   LEUKOCYTESUR Negative 11/15/2014 1755   Sepsis Labs: Invalid input(s): "PROCALCITONIN", "LACTICIDVEN"  Microbiology: No results found for this or any previous visit (from the past 240 hours).  Radiology Studies: CT HEAD WO CONTRAST ( ) Result Date: 02/03/2024 CLINICAL DATA:  Altered mental status EXAM: CT HEAD WITHOUT CONTRAST TECHNIQUE: Contiguous axial images were obtained from the base of the skull through the vertex without intravenous contrast. RADIATION DOSE REDUCTION: This exam was performed according to the departmental dose-optimization program which includes automated exposure control, adjustment of the mA and/or kV according to patient size and/or use of iterative reconstruction technique. COMPARISON:  09/16/2023 FINDINGS: Brain: No evidence of acute infarction, hemorrhage, hydrocephalus, extra-axial collection or mass lesion/mass effect. Vascular: No hyperdense vessel or unexpected calcification. Skull: Normal. Negative for fracture or focal lesion. Sinuses/Orbits: No acute finding. Other: None. IMPRESSION: No acute intracranial abnormality noted. Electronically Signed   By: Alcide Clever M.D.   On: 02/03/2024 02:52   CT ABDOMEN PELVIS W CONTRAST Result Date: 02/03/2024 CLINICAL DATA:  Pancreatitis, acute, severe EXAM: CT ABDOMEN AND PELVIS WITH CONTRAST TECHNIQUE: Multidetector CT imaging of the abdomen and pelvis was performed using the standard protocol following bolus administration of intravenous contrast. RADIATION DOSE REDUCTION: This exam was performed according to the departmental dose-optimization program which includes automated exposure control, adjustment of the mA and/or kV according to patient size and/or use of iterative reconstruction technique. CONTRAST:  75mL OMNIPAQUE IOHEXOL 350 MG/ML SOLN COMPARISON:  CT abdomen pelvis 12/17/2023, CT abdomen pelvis 11/25/2023, CT abdomen pelvis 10/28/2023, CT abdomen pelvis 08/22/2023, CT abdomen pelvis  05/24/2023 FINDINGS: Lower chest: No acute abnormality. Hepatobiliary: No focal liver abnormality. No gallstones, gallbladder wall thickening, or pericholecystic fluid. No biliary dilatation. Pancreas: Grossly stable 1.6 cm proximal pancreas fluid dense lesion (3:24). Normal pancreatic contour. Interval increase in proximal pancreas peripancreatic free fluid with mass effect on the proximal portal vein with focal marked stenosis (3:22). No main pancreatic ductal dilatation. Spleen: Normal in size without focal abnormality. Adrenals/Urinary Tract: No adrenal nodule bilaterally. Bilateral kidneys enhance symmetrically. No hydronephrosis. No hydroureter. The urinary bladder is unremarkable. Stomach/Bowel: Stomach is within normal limits. Small bowel loops within the lower anterior abdomen dilated with air and fluid measuring up to 3.7 cm in caliber. No definite transition point identified. No evidence of large bowel wall thickening or dilatation. Appendix appears normal. Vascular/Lymphatic: No abdominal aorta or iliac aneurysm. No abdominal, pelvic, or inguinal lymphadenopathy. Reproductive: Prostate enlarged measuring 4.9 cm. Other: Upper abdomen simple free fluid. No intraperitoneal free gas. No organized fluid collection. Musculoskeletal: No abdominal wall hernia or abnormality. No suspicious lytic or blastic osseous lesions. No acute displaced fracture. IMPRESSION: 1. Interval worsening acute pancreatitis. 2. Grossly stable 1.6 cm proximal pancreas  fluid dense lesion with mass effect on the proximal portal vein with focal marked stenosis. Underlying thrombus not excluded. 3. Small bowel loops within the lower anterior abdomen dilated with air and fluid measuring up to 3.7 cm in caliber. No definite transition point identified. Finding could represent ileus versus partial small bowel obstruction. 4. Prostatomegaly. 5.  Aortic Atherosclerosis (ICD10-I70.0). Electronically Signed   By: Tish Frederickson M.D.   On:  02/03/2024 00:00      Zebedee Segundo T. Lotoya Casella [facility]  If 7PM-7AM, please contact night-coverage [email or web address] 02/03/2024, 12:51 PM

## 2024-02-04 ENCOUNTER — Telehealth: Payer: Self-pay

## 2024-02-04 DIAGNOSIS — B20 Human immunodeficiency virus [HIV] disease: Secondary | ICD-10-CM | POA: Diagnosis not present

## 2024-02-04 DIAGNOSIS — F1020 Alcohol dependence, uncomplicated: Secondary | ICD-10-CM | POA: Diagnosis not present

## 2024-02-04 DIAGNOSIS — I81 Portal vein thrombosis: Secondary | ICD-10-CM | POA: Diagnosis not present

## 2024-02-04 DIAGNOSIS — K852 Alcohol induced acute pancreatitis without necrosis or infection: Secondary | ICD-10-CM | POA: Diagnosis not present

## 2024-02-04 LAB — COMPREHENSIVE METABOLIC PANEL WITH GFR
ALT: 15 U/L (ref 0–44)
AST: 17 U/L (ref 15–41)
Albumin: 3.1 g/dL — ABNORMAL LOW (ref 3.5–5.0)
Alkaline Phosphatase: 49 U/L (ref 38–126)
Anion gap: 7 (ref 5–15)
BUN: <5 mg/dL — ABNORMAL LOW (ref 6–20)
CO2: 25 mmol/L (ref 22–32)
Calcium: 8.8 mg/dL — ABNORMAL LOW (ref 8.9–10.3)
Chloride: 106 mmol/L (ref 98–111)
Creatinine, Ser: 0.51 mg/dL — ABNORMAL LOW (ref 0.61–1.24)
GFR, Estimated: >60 mL/min (ref >60)
Glucose, Bld: 108 mg/dL — ABNORMAL HIGH (ref 70–99)
Potassium: 4.2 mmol/L (ref 3.5–5.1)
Sodium: 138 mmol/L (ref 135–145)
Total Bilirubin: 0.8 mg/dL (ref 0.0–1.2)
Total Protein: 5.7 g/dL — ABNORMAL LOW (ref 6.5–8.1)

## 2024-02-04 LAB — CBC
HCT: 33.9 % — ABNORMAL LOW (ref 39.0–52.0)
Hemoglobin: 11.3 g/dL — ABNORMAL LOW (ref 13.0–17.0)
MCH: 30.7 pg (ref 26.0–34.0)
MCHC: 33.3 g/dL (ref 30.0–36.0)
MCV: 92.1 fL (ref 80.0–100.0)
Platelets: 139 10*3/uL — ABNORMAL LOW (ref 150–400)
RBC: 3.68 MIL/uL — ABNORMAL LOW (ref 4.22–5.81)
RDW: 13.9 % (ref 11.5–15.5)
WBC: 2.4 10*3/uL — ABNORMAL LOW (ref 4.0–10.5)
nRBC: 0 % (ref 0.0–0.2)

## 2024-02-04 LAB — PROTIME-INR
INR: 1.1 (ref 0.8–1.2)
Prothrombin Time: 14.8 s (ref 11.4–15.2)

## 2024-02-04 LAB — PHOSPHORUS: Phosphorus: 3.7 mg/dL (ref 2.5–4.6)

## 2024-02-04 LAB — AMMONIA: Ammonia: 33 umol/L (ref 9–35)

## 2024-02-04 LAB — MAGNESIUM: Magnesium: 1.8 mg/dL (ref 1.7–2.4)

## 2024-02-04 LAB — RPR: RPR Ser Ql: NONREACTIVE

## 2024-02-04 LAB — CK: Total CK: 52 U/L (ref 49–397)

## 2024-02-04 LAB — LIPASE, BLOOD: Lipase: 84 U/L — ABNORMAL HIGH (ref 11–51)

## 2024-02-04 LAB — HEPARIN LEVEL (UNFRACTIONATED): Heparin Unfractionated: <0.1 [IU]/mL — ABNORMAL LOW (ref 0.30–0.70)

## 2024-02-04 MED ORDER — OXYCODONE HCL 5 MG PO TABS: 5.0000 mg | ORAL_TABLET | Freq: Four times a day (QID) | ORAL | Status: DC | PRN

## 2024-02-04 MED ORDER — HEPARIN BOLUS VIA INFUSION
2000.0000 [IU] | Freq: Once | INTRAVENOUS | Status: AC
  Administered 2024-02-04: 2000 [IU] via INTRAVENOUS
  Filled 2024-02-04: qty 2000

## 2024-02-04 MED ORDER — ENOXAPARIN SODIUM 40 MG/0.4ML IJ SOSY: 40.0000 mg | PREFILLED_SYRINGE | INTRAMUSCULAR | Status: DC

## 2024-02-04 MED ORDER — CYANOCOBALAMIN 1000 MCG/ML IJ SOLN
1000.0000 ug | Freq: Every day | INTRAMUSCULAR | Status: DC
  Administered 2024-02-04: 1000 ug via SUBCUTANEOUS
  Filled 2024-02-04: qty 1

## 2024-02-04 MED ORDER — MORPHINE SULFATE (PF) 2 MG/ML IV SOLN
1.0000 mg | INTRAVENOUS | Status: DC | PRN
  Administered 2024-02-04: 2 mg via INTRAVENOUS
  Filled 2024-02-04: qty 1

## 2024-02-04 NOTE — Progress Notes (Signed)
   02/03/24 2318  Provider Notification  Provider Name/Title Dr. Tereasa Coop, MD  Date Provider Notified 02/03/24  Time Provider Notified 2319  Method of Notification Page  Notification Reason Requested the 2nd time by patient for higher dose and more frequent of pain med. Abdominal pain was not relieved after 1 mg of morphine q 2 hrs.   Provider response Evaluate remotely;See new modified orders for 1-2 mg of morphine q 2 hrs.   Date of Provider Response 02/03/24  Time of Provider Response 2319   Filiberto Pinks, RN

## 2024-02-04 NOTE — Progress Notes (Signed)
 PHARMACY - ANTICOAGULATION CONSULT NOTE  Pharmacy Consult for heparin Indication: R/O portal vein thrombosis  Allergies  Allergen Reactions   Tegretol [Carbamazepine] Other (See Comments)    Vertigo  Paralysis   Caffeine Palpitations    Patient Measurements: Height: 6' (182.9 cm) Weight: 61.2 kg (134 lb 14.7 oz) IBW/kg (Calculated) : 77.6 HEPARIN DW (KG): 61.2  Vital Signs: Temp: 97.9 F (36.6 C) (03/27 2330) Temp Source: Oral (03/27 2330) BP: 97/80 (03/28 0315) Pulse Rate: 69 (03/28 0315)  Labs: Recent Labs    02/02/24 1821 02/03/24 0435 02/03/24 1109 02/03/24 1655 02/04/24 0218  HGB 12.0* 10.7*  --   --  11.3*  HCT 35.9* 32.4*  --   --  33.9*  PLT 212 175  --   --  139*  LABPROT  --   --   --   --  14.8  INR  --   --   --   --  1.1  HEPARINUNFRC  --   --  0.56 <0.10* <0.10*  CREATININE 0.61 0.49*  --   --   --     Estimated Creatinine Clearance: 109.4 mL/min (A) (by C-G formula based on SCr of 0.49 mg/dL (L)).   Medical History: Past Medical History:  Diagnosis Date   Alcohol abuse    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   Anxiety    Bipolar 2 disorder (HCC)    HIV (human immunodeficiency virus infection) (HCC)    Pancreatitis    PTSD (post-traumatic stress disorder)    Schizophrenia (HCC)    Seizures (HCC)    Subdural hematoma (HCC)    Assessment: 37 yoM presented to ED with abdominal pain/vomiting. Pharmacy  to dose heparin for possible portal vein thrombosis.  -No PTA anticoagulation -Hgb 11, plts 212 > 139  Heparin level now undetectable x2, no infusion issues/signs/symptoms of bleeding per RN  Goal of Therapy:  Heparin level 0.3-0.7 units/ml Monitor platelets by anticoagulation protocol: Yes   Plan:  Heparin bolus 2000 units IV x1 Increase heparin to 1400 units / hr Heparin level in 6 hours CBC daily  Arabella Merles, PharmD. Clinical Pharmacist 02/04/2024 3:28 AM

## 2024-02-04 NOTE — Progress Notes (Signed)
 PROGRESS NOTE        PATIENT DETAILS Name: Gary [name] Age: 38 y.o. Sex: male Date of Birth: May 05, 1986 Admit Date: 02/02/2024 Admitting Physician John Giovanni, MD PCP:Pcp, No  Brief Summary: Patient is a 38 y.o.  male with history of alcohol use, recurrent alcoholic pancreatitis, HIV, bipolar disorder/schizophrenia/PTSD, seizure disorder, homelessness-who presented with abdominal pain-found to have acute  pancreatitis.  Significant events: 3/28>> admit to [facility]  Significant studies: 3/26>> CT abdomen/pelvis: Worsening acute pancreatitis, 1.6 cm proximal pancreas fluid density lesion with mass effect on the portal vein-underlying thrombus not excluded. 3/27>> CT head: No acute abnormality 3/27>> Duplex ultrasound of liver: No portal vein thrombosis.  Significant microbiology data: None  Procedures: None  Consults: None  Subjective: Tolerating liquids-slow/some improvement in abdominal pain overnight.  Awake/alert.  Some tremors.  Objective: Vitals: Blood pressure 99/79, pulse 80, temperature 98.2 F (36.8 C), temperature source Oral, resp. rate (!) 24, height 6' (1.829 m), weight 61.2 kg, SpO2 97%.   Exam: Gen Exam:Alert awake-not in any distress HEENT:atraumatic, normocephalic Chest: B/L clear to auscultation anteriorly CVS:S1S2 regular Abdomen: Soft-mildly tender in the epigastric area. Extremities:no edema Neurology: Non focal Skin: no rash  Pertinent Labs/Radiology:    Latest Ref Rng & Units 02/04/2024    2:18 AM 02/03/2024    4:35 AM 02/02/2024    6:21 PM  CBC  WBC 4.0 - 10.5 K/uL 2.4  4.6  4.2   Hemoglobin 13.0 - 17.0 g/dL 16.1  09.6  04.5   Hematocrit 39.0 - 52.0 % 33.9  32.4  35.9   Platelets 150 - 400 K/uL 139  175  212     Lab Results  Component Value Date   NA 138 02/04/2024   K 4.2 02/04/2024   CL 106 02/04/2024   CO2 25 02/04/2024      Assessment/Plan: Acute pancreatitis Related to alcohol use Slowly  improving Advance to full liquids Continue as needed narcotics/antiemetics  1.6 cm proximal fluid density-likely pseudocyst with mass effect on portal vein Needs repeat imaging in 4 to 6 weeks Doppler negative for portal vein thrombus-stop IV heparin.  EtOH withdrawal History of EtOH use Reportedly drinks 1 bottle of vodka daily Some tremors but awake and alert Continue Ativan per CIWA protocol Continue thiamine/MVI/folic acid  Acute toxic/metabolic encephalopathy Improved-likely due to EtOH intoxication/withdrawal. Ammonia/TSH stable Vitamin B12 borderline low will supplement-see below.  History of seizures Continue Keppra  Borderline B12 deficiency Begin SQ supplementation Will place on oral supplementation on discharge.  HIV (last CD4 on 1/29-598)  continue Biktarvy  Homelessness Social worker/TOC eval prior to discharge  Tobacco abuse disorder Counseled  Code status:   Code Status: Full Code   DVT Prophylaxis: enoxaparin (LOVENOX) injection 40 mg Start: 02/04/24 1700 SCDs Start: 02/03/24 0208    Family Communication: None at bedside   Disposition Plan: Status is: Inpatient Remains inpatient appropriate because: Severity of illness   Planned Discharge Destination:Home   Diet: Diet Order             Diet full liquid Fluid consistency: Thin  Diet effective now                     Antimicrobial agents: Anti-infectives (From admission, onward)    Start     Dose/Rate Route Frequency Ordered Stop   02/03/24 1000  bictegravir-emtricitabine-tenofovir AF (BIKTARVY)  50-200-25 MG per tablet 1 tablet        1 tablet Oral Daily 02/03/24 0209          MEDICATIONS: Scheduled Meds:  bictegravir-emtricitabine-tenofovir AF  1 tablet Oral Daily   bisacodyl  10 mg Rectal Once   cyanocobalamin  1,000 mcg Subcutaneous Daily   enoxaparin (LOVENOX) injection  40 mg Subcutaneous Q24H   folic acid  1 mg Oral Daily   levETIRAcetam  500 mg Oral BID    multivitamin with minerals  1 tablet Oral Daily   pantoprazole  40 mg Oral Daily   thiamine  100 mg Oral Daily   Or   thiamine  100 mg Intravenous Daily   Continuous Infusions:   PRN Meds:.acetaminophen **OR** acetaminophen, bisacodyl **FOLLOWED BY** bisacodyl, LORazepam **OR** LORazepam, morphine injection, naLOXone (NARCAN)  injection, ondansetron (ZOFRAN) IV, oxyCODONE, [COMPLETED] polyethylene glycol **FOLLOWED BY** polyethylene glycol   I have personally reviewed following labs and imaging studies  LABORATORY DATA: CBC: Recent Labs  Lab 02/02/24 1821 02/03/24 0435 02/04/24 0218  WBC 4.2 4.6 2.4*  HGB 12.0* 10.7* 11.3*  HCT 35.9* 32.4* 33.9*  MCV 92.8 91.8 92.1  PLT 212 175 139*    Basic Metabolic Panel: Recent Labs  Lab 02/02/24 1821 02/03/24 0435 02/04/24 0218  NA 142 135 138  K 3.7 3.4* 4.2  CL 109 105 106  CO2 27 24 25   GLUCOSE 103* 112* 108*  BUN 8 5* <5*  CREATININE 0.61 0.49* 0.51*  CALCIUM 9.3 8.6* 8.8*  MG  --  1.7 1.8  PHOS  --  3.5 3.7    GFR: Estimated Creatinine Clearance: 109.4 mL/min (A) (by C-G formula based on SCr of 0.51 mg/dL (L)).  Liver Function Tests: Recent Labs  Lab 02/02/24 1821 02/03/24 0435 02/04/24 0218  AST 19 18 17   ALT 17 15 15   ALKPHOS 54 50 49  BILITOT 0.4 0.6 0.8  PROT 6.6 5.7* 5.7*  ALBUMIN 3.8 3.1* 3.1*   Recent Labs  Lab 02/02/24 1821 02/03/24 0435 02/04/24 0218  LIPASE 607* 728* 84*   Recent Labs  Lab 02/03/24 1349 02/04/24 0218  AMMONIA 29 33    Coagulation Profile: Recent Labs  Lab 02/04/24 0218  INR 1.1    Cardiac Enzymes: Recent Labs  Lab 02/04/24 0218  CKTOTAL 52    BNP (last 3 results) No results for input(s): "PROBNP" in the last 8760 hours.  Lipid Profile: No results for input(s): "CHOL", "HDL", "LDLCALC", "TRIG", "CHOLHDL", "LDLDIRECT" in the last 72 hours.  Thyroid Function Tests: Recent Labs    02/03/24 1353  TSH 1.083    Anemia Panel: Recent Labs     02/03/24 1352  VITAMINB12 267    Urine analysis:    Component Value Date/Time   COLORURINE YELLOW 02/03/2024 0105   APPEARANCEUR CLEAR 02/03/2024 0105   APPEARANCEUR Clear 11/15/2014 1755   LABSPEC 1.043 (H) 02/03/2024 0105   LABSPEC 1.002 11/15/2014 1755   PHURINE 9.0 (H) 02/03/2024 0105   GLUCOSEU NEGATIVE 02/03/2024 0105   GLUCOSEU Negative 11/15/2014 1755   HGBUR NEGATIVE 02/03/2024 0105   BILIRUBINUR NEGATIVE 02/03/2024 0105   BILIRUBINUR Negative 11/15/2014 1755   KETONESUR NEGATIVE 02/03/2024 0105   PROTEINUR NEGATIVE 02/03/2024 0105   NITRITE NEGATIVE 02/03/2024 0105   LEUKOCYTESUR NEGATIVE 02/03/2024 0105   LEUKOCYTESUR Negative 11/15/2014 1755    Sepsis Labs: Lactic Acid, Venous    Component Value Date/Time   LATICACIDVEN 0.5 11/07/2023 0521    MICROBIOLOGY: No results found for this  or any previous visit (from the past 240 hours).  RADIOLOGY STUDIES/RESULTS: US LIVER DOPPLER Result Date: 02/04/2024 CLINICAL DATA:  Portal vein thrombosis EXAM: DUPLEX ULTRASOUND OF LIVER TECHNIQUE: Color and duplex Doppler ultrasound was performed to evaluate the hepatic in-flow and out-flow vessels. COMPARISON:  CT from previous day FINDINGS: Liver: Normal parenchymal echogenicity. Normal hepatic contour without nodularity. No focal lesion, mass or intrahepatic biliary ductal dilatation. Main Portal Vein size: 2.3 cm Superior mesenteric vein not visualized. Portal Vein Velocities (all hepatopetal): Main Prox:  86 cm/sec Main Mid: 92 cm/sec Main Dist:  89 cm/sec Right: 25 cm/sec Left: 29 cm/sec Hepatic Vein Velocities (all hepatofugal): Right:  13 cm/sec Middle:  15 cm/sec Left:  22 cm/sec IVC: Present and patent with normal respiratory phasicity. Velocity 16 cm/sec. Hepatic Artery Velocity:  Not identified Splenic Vein Velocity:  23 cm/sec Spleen: 13.5 cm x 13.5 cm x 4.4 cm with a total volume of 420 cm^3 (411 cm^3 is upper limit normal) Portal Vein Occlusion/Thrombus: No Splenic Vein  Occlusion/Thrombus: No Ascites: Trace Varices: None identified IMPRESSION: 1. Patent portal vein with normal directional flow. 2. No hepatic vein thrombosis. 3. Trace ascites. 4. Mild splenomegaly. Electronically Signed   By: Corlis Leak M.D.   On: 02/04/2024 07:47   CT HEAD WO CONTRAST ( ) Result Date: 02/03/2024 CLINICAL DATA:  Altered mental status EXAM: CT HEAD WITHOUT CONTRAST TECHNIQUE: Contiguous axial images were obtained from the base of the skull through the vertex without intravenous contrast. RADIATION DOSE REDUCTION: This exam was performed according to the departmental dose-optimization program which includes automated exposure control, adjustment of the mA and/or kV according to patient size and/or use of iterative reconstruction technique. COMPARISON:  09/16/2023 FINDINGS: Brain: No evidence of acute infarction, hemorrhage, hydrocephalus, extra-axial collection or mass lesion/mass effect. Vascular: No hyperdense vessel or unexpected calcification. Skull: Normal. Negative for fracture or focal lesion. Sinuses/Orbits: No acute finding. Other: None. IMPRESSION: No acute intracranial abnormality noted. Electronically Signed   By: Alcide Clever M.D.   On: 02/03/2024 02:52   CT ABDOMEN PELVIS W CONTRAST Result Date: 02/03/2024 CLINICAL DATA:  Pancreatitis, acute, severe EXAM: CT ABDOMEN AND PELVIS WITH CONTRAST TECHNIQUE: Multidetector CT imaging of the abdomen and pelvis was performed using the standard protocol following bolus administration of intravenous contrast. RADIATION DOSE REDUCTION: This exam was performed according to the departmental dose-optimization program which includes automated exposure control, adjustment of the mA and/or kV according to patient size and/or use of iterative reconstruction technique. CONTRAST:  75mL OMNIPAQUE IOHEXOL 350 MG/ML SOLN COMPARISON:  CT abdomen pelvis 12/17/2023, CT abdomen pelvis 11/25/2023, CT abdomen pelvis 10/28/2023, CT abdomen pelvis 08/22/2023, CT  abdomen pelvis 05/24/2023 FINDINGS: Lower chest: No acute abnormality. Hepatobiliary: No focal liver abnormality. No gallstones, gallbladder wall thickening, or pericholecystic fluid. No biliary dilatation. Pancreas: Grossly stable 1.6 cm proximal pancreas fluid dense lesion (3:24). Normal pancreatic contour. Interval increase in proximal pancreas peripancreatic free fluid with mass effect on the proximal portal vein with focal marked stenosis (3:22). No main pancreatic ductal dilatation. Spleen: Normal in size without focal abnormality. Adrenals/Urinary Tract: No adrenal nodule bilaterally. Bilateral kidneys enhance symmetrically. No hydronephrosis. No hydroureter. The urinary bladder is unremarkable. Stomach/Bowel: Stomach is within normal limits. Small bowel loops within the lower anterior abdomen dilated with air and fluid measuring up to 3.7 cm in caliber. No definite transition point identified. No evidence of large bowel wall thickening or dilatation. Appendix appears normal. Vascular/Lymphatic: No abdominal aorta or iliac aneurysm. No abdominal, pelvic, or inguinal  lymphadenopathy. Reproductive: Prostate enlarged measuring 4.9 cm. Other: Upper abdomen simple free fluid. No intraperitoneal free gas. No organized fluid collection. Musculoskeletal: No abdominal wall hernia or abnormality. No suspicious lytic or blastic osseous lesions. No acute displaced fracture. IMPRESSION: 1. Interval worsening acute pancreatitis. 2. 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 Underlying thrombus not excluded. 3. Small bowel loops within the lower anterior abdomen dilated with air and fluid measuring up to 3.7 cm in caliber. No definite transition point identified. Finding could represent ileus versus partial small bowel obstruction. 4. Prostatomegaly. 5.  Aortic Atherosclerosis (ICD10-I70.0). Electronically Signed   By: Tish Frederickson  M.D.   On: 02/03/2024 00:00     LOS: 1 day   Jeoffrey Massed, MD  [facility]    To contact the attending provider between 7A-7P or the covering provider during after hours 7P-7A, please log into the web site [email or web address] and access using universal [place] password for that web site. If you do not have the password, please call the hospital operator.  02/04/2024, 9:37 AM

## 2024-02-04 NOTE — Progress Notes (Signed)
 Was alerted by tele that patient is off the monitor. On arrival to patient room patient is dressed with back pack on. When asked what happened patient voice that he wants to leave because he does not know why he is here and he needs medication for pain. Patient voice that he is in a 10/10 pain to the abdomen. Patient was educated and Dr Reece Agar was made aware of patient request. I ask patient if he gets the medication if that would help and Dr Reece Agar would speak with him as soon as possible he said yes and he would wait to speak with the Doctor. Patient was subsequently medicated and as soon patient received the 2 mg of morphine among other medication, his remarked was "then now you can take the fucking IV out". Patient was told to wait till I get gauze, however he choose not to wait and pulled them out himself. Patient signed AMA form and left the unit ambulating.

## 2024-02-04 NOTE — Telephone Encounter (Signed)
 Medication was picked up at [facility] on  02/04/24 at 11:45AM

## 2024-02-04 NOTE — Progress Notes (Signed)
   02/03/24 2223  Provider Notification  Provider Name/Title Dr. Tereasa Coop, MD  Date Provider Notified 02/03/24  Time Provider Notified 2223  Method of Notification Page  Notification Reason Requested by patient  for more frequent pain med. Abdominal pain scale 10/10. Pain was not relieved after 2 hrs of 1 mg morphine.   Provider response Evaluate remotely;See new orders for MO 1 mg q 2 hrs  Date of Provider Response 02/03/24  Time of Provider Response 2223   Filiberto Pinks, RN

## 2024-02-04 NOTE — Progress Notes (Addendum)
 Pt has a drug seeking behavior by frequently asking for the pain med q 1 hr, more than the prescription. He does not show any physical signs of sever pain, instead, he appears to be more drowsy, sleepy, looks comfortably sleeping on his bed, HR 67-81, BP 89/61 - 94/71 mmHg, RR 12-16.   We discuss with the Pt, concerning of  low BP and must set the boundary with timing for pain management and Ativan for his anti-anxiety. CIWA score is 10-12 prior giving Ativan. Pt frequently requests more brought, good clear liquid diet per oral intake with no nausea/vomiting. Continue NSS 125 ml/hr.  Will continue to monitor.   Filiberto Pinks, RN

## 2024-02-05 ENCOUNTER — Encounter (HOSPITAL_COMMUNITY): Payer: Self-pay | Admitting: *Deleted

## 2024-02-05 ENCOUNTER — Emergency Department (HOSPITAL_COMMUNITY)
Admission: EM | Admit: 2024-02-05 | Discharge: 2024-02-06 | Disposition: A | Discharge status: Home / Self Care | Payer: MEDICAID | Attending: Emergency Medicine | Admitting: Emergency Medicine

## 2024-02-05 ENCOUNTER — Other Ambulatory Visit: Payer: Self-pay

## 2024-02-05 DIAGNOSIS — K861 Other chronic pancreatitis: Secondary | ICD-10-CM

## 2024-02-05 DIAGNOSIS — K860 Alcohol-induced chronic pancreatitis: Secondary | ICD-10-CM | POA: Insufficient documentation

## 2024-02-05 DIAGNOSIS — K859 Acute pancreatitis without necrosis or infection, unspecified: Secondary | ICD-10-CM | POA: Diagnosis not present

## 2024-02-05 DIAGNOSIS — R112 Nausea with vomiting, unspecified: Secondary | ICD-10-CM | POA: Diagnosis present

## 2024-02-05 LAB — COMPREHENSIVE METABOLIC PANEL WITH GFR
ALT: 15 U/L (ref 0–44)
AST: 16 U/L (ref 15–41)
Albumin: 3.6 g/dL (ref 3.5–5.0)
Alkaline Phosphatase: 49 U/L (ref 38–126)
Anion gap: 10 (ref 5–15)
BUN: 7 mg/dL (ref 6–20)
CO2: 25 mmol/L (ref 22–32)
Calcium: 9.2 mg/dL (ref 8.9–10.3)
Chloride: 104 mmol/L (ref 98–111)
Creatinine, Ser: 0.61 mg/dL (ref 0.61–1.24)
GFR, Estimated: >60 mL/min (ref >60)
Glucose, Bld: 108 mg/dL — ABNORMAL HIGH (ref 70–99)
Potassium: 3.6 mmol/L (ref 3.5–5.1)
Sodium: 139 mmol/L (ref 135–145)
Total Bilirubin: 0.5 mg/dL (ref 0.0–1.2)
Total Protein: 6.3 g/dL — ABNORMAL LOW (ref 6.5–8.1)

## 2024-02-05 LAB — CBC WITH DIFFERENTIAL/PLATELET
Abs Immature Granulocytes: 0.01 10*3/uL (ref 0.00–0.07)
Basophils Absolute: 0 10*3/uL (ref 0.0–0.1)
Basophils Relative: 1 %
Eosinophils Absolute: 0.2 10*3/uL (ref 0.0–0.5)
Eosinophils Relative: 6 %
HCT: 34.2 % — ABNORMAL LOW (ref 39.0–52.0)
Hemoglobin: 11.5 g/dL — ABNORMAL LOW (ref 13.0–17.0)
Immature Granulocytes: 0 %
Lymphocytes Relative: 38 %
Lymphs Abs: 1.4 10*3/uL (ref 0.7–4.0)
MCH: 31 pg (ref 26.0–34.0)
MCHC: 33.6 g/dL (ref 30.0–36.0)
MCV: 92.2 fL (ref 80.0–100.0)
Monocytes Absolute: 0.2 10*3/uL (ref 0.1–1.0)
Monocytes Relative: 7 %
Neutro Abs: 1.8 10*3/uL (ref 1.7–7.7)
Neutrophils Relative %: 48 %
Platelets: 173 10*3/uL (ref 150–400)
RBC: 3.71 MIL/uL — ABNORMAL LOW (ref 4.22–5.81)
RDW: 13.9 % (ref 11.5–15.5)
WBC: 3.6 10*3/uL — ABNORMAL LOW (ref 4.0–10.5)
nRBC: 0 % (ref 0.0–0.2)

## 2024-02-05 LAB — LIPASE, BLOOD: Lipase: 439 U/L — ABNORMAL HIGH (ref 11–51)

## 2024-02-05 MED ORDER — THIAMINE MONONITRATE 100 MG PO TABS
100.0000 mg | ORAL_TABLET | Freq: Every day | ORAL | Status: DC
  Administered 2024-02-06: 100 mg via ORAL
  Filled 2024-02-05: qty 1

## 2024-02-05 MED ORDER — HYDROMORPHONE HCL 1 MG/ML IJ SOLN: 1.0000 mg | Freq: Once | INTRAMUSCULAR | Status: DC

## 2024-02-05 MED ORDER — PROCHLORPERAZINE EDISYLATE 10 MG/2ML IJ SOLN: 10.0000 mg | Freq: Four times a day (QID) | INTRAMUSCULAR | Status: DC | PRN

## 2024-02-05 MED ORDER — PROCHLORPERAZINE EDISYLATE 10 MG/2ML IJ SOLN
10.0000 mg | Freq: Once | INTRAMUSCULAR | Status: AC
  Administered 2024-02-06: 10 mg via INTRAVENOUS
  Filled 2024-02-05: qty 2

## 2024-02-05 MED ORDER — ACETAMINOPHEN 500 MG PO TABS
1000.0000 mg | ORAL_TABLET | Freq: Four times a day (QID) | ORAL | Status: DC
  Administered 2024-02-06: 1000 mg via ORAL
  Filled 2024-02-05: qty 2

## 2024-02-05 MED ORDER — HYDROMORPHONE HCL 1 MG/ML IJ SOLN
1.0000 mg | Freq: Once | INTRAMUSCULAR | Status: AC
  Administered 2024-02-05: 1 mg via INTRAVENOUS
  Filled 2024-02-05: qty 1

## 2024-02-05 MED ORDER — LEVETIRACETAM 500 MG PO TABS
500.0000 mg | ORAL_TABLET | Freq: Two times a day (BID) | ORAL | Status: DC
  Administered 2024-02-06: 500 mg via ORAL
  Filled 2024-02-05: qty 1

## 2024-02-05 MED ORDER — OXYCODONE HCL 5 MG PO TABS: 2.5000 mg | ORAL_TABLET | Freq: Four times a day (QID) | ORAL | Status: DC | PRN

## 2024-02-05 MED ORDER — THIAMINE HCL 100 MG/ML IJ SOLN: 100.0000 mg | Freq: Every day | INTRAMUSCULAR | Status: DC

## 2024-02-05 MED ORDER — FOLIC ACID 1 MG PO TABS: 1.0000 mg | ORAL_TABLET | Freq: Every day | ORAL | Status: DC

## 2024-02-05 MED ORDER — BICTEGRAVIR-EMTRICITAB-TENOFOV 50-200-25 MG PO TABS
1.0000 | ORAL_TABLET | Freq: Every day | ORAL | Status: DC
  Filled 2024-02-05: qty 1

## 2024-02-05 MED ORDER — ONDANSETRON HCL 4 MG/2ML IJ SOLN: 4.0000 mg | Freq: Four times a day (QID) | INTRAMUSCULAR | Status: DC | PRN

## 2024-02-05 MED ORDER — SODIUM CHLORIDE 0.9 % IV SOLN: INTRAVENOUS | Status: DC

## 2024-02-05 MED ORDER — DROPERIDOL 2.5 MG/ML IJ SOLN
1.2500 mg | Freq: Once | INTRAMUSCULAR | Status: AC
  Administered 2024-02-05: 1.25 mg via INTRAVENOUS
  Filled 2024-02-05: qty 2

## 2024-02-05 MED ORDER — ADULT MULTIVITAMIN W/MINERALS CH: 1.0000 | ORAL_TABLET | Freq: Every day | ORAL | Status: DC

## 2024-02-05 MED ORDER — DIAZEPAM 5 MG/ML IJ SOLN
5.0000 mg | Freq: Once | INTRAMUSCULAR | Status: AC
  Administered 2024-02-05: 5 mg via INTRAVENOUS
  Filled 2024-02-05: qty 2

## 2024-02-05 MED ORDER — QUETIAPINE FUMARATE 25 MG PO TABS
50.0000 mg | ORAL_TABLET | Freq: Two times a day (BID) | ORAL | Status: DC
  Administered 2024-02-06: 50 mg via ORAL
  Filled 2024-02-05: qty 2

## 2024-02-05 MED ORDER — OXYCODONE HCL 5 MG PO TABS
5.0000 mg | ORAL_TABLET | Freq: Four times a day (QID) | ORAL | Status: DC | PRN
  Administered 2024-02-06: 5 mg via ORAL
  Filled 2024-02-05: qty 1

## 2024-02-05 MED ORDER — HYDROXYZINE HCL 25 MG PO TABS: 25.0000 mg | ORAL_TABLET | Freq: Three times a day (TID) | ORAL | Status: DC | PRN

## 2024-02-05 MED ORDER — SODIUM CHLORIDE 0.9 % IV BOLUS
1000.0000 mL | Freq: Once | INTRAVENOUS | Status: AC
  Administered 2024-02-05: 1000 mL via INTRAVENOUS

## 2024-02-05 NOTE — ED Triage Notes (Signed)
 The pt arrived by [address] from the [address]  the pt is homeless c/o abd pain today  hx of pancreatitis  he drank a beer this am he frequent this ed last time 3-26

## 2024-02-05 NOTE — ED Provider Notes (Signed)
 Received phone call from Dr. Lazarus Salines who was the consulting hospitalist on this patient's case.  He has examined the patient, and reviewed the patient's chart.  He feels that there is a large malingering component to this patient's recurrent visit and admission, extensive treatment with benzos during his admissions.  Hospitalist is recommending ED opts overnight with oral medications, patient was informed to not be receiving any further IV benzos or narcotics.  Hospitalist recommending GI consult for further management of chronic alcoholic pancreatitis.  He remains available for admission should the patient's condition deteriorate.

## 2024-02-05 NOTE — ED Notes (Signed)
 Left VM for social work regarding [facility] consult

## 2024-02-05 NOTE — ED Provider Notes (Signed)
 [place] EMERGENCY DEPARTMENT AT [facility] Provider Note   CSN: [id number] Arrival date & time: 02/05/24  1814     History  Chief Complaint  Patient presents with   Abdominal Pain    Gary [name] is a 38 y.o. male.  38 yo M with a chief complaint of abdominal pain and intractable nausea and vomiting.  The patient unfortunately has a history of the same.  Was just admitted for acute pancreatitis and inability to eat or drink and left AGAINST MEDICAL ADVICE yesterday.  He said since then the pain has persisted and gotten worse.  No fevers.  Feel similar to his prior episodes.   Abdominal Pain      Home Medications Prior to Admission medications   Medication Sig Start Date End Date Taking? Authorizing Provider  bictegravir-emtricitabine-tenofovir AF (BIKTARVY) 50-200-25 MG TABS tablet Take 1 tablet by mouth daily. 01/28/24   Vu, Tonita Phoenix, MD  hydrOXYzine (VISTARIL) 25 MG capsule Take 1 capsule (25 mg total) by mouth 3 (three) times daily as needed. Patient not taking: Reported on 02/02/2024 01/28/24 02/27/24  Rutha Bouchard T, MD  levETIRAcetam (KEPPRA) 500 MG tablet Take 1 tablet (500 mg total) by mouth 2 (two) times daily. Patient not taking: Reported on 02/02/2024 11/18/23   Gunnar Bulla, MD  ondansetron (ZOFRAN-ODT) 4 MG disintegrating tablet Dissolve 1 tablet (4 mg total) by mouth every 8 (eight) hours as needed for nausea or vomiting. Patient not taking: Reported on 12/31/2023 12/29/23   Gailen Shelter, PA  QUEtiapine (SEROQUEL) 50 MG tablet Take 1 tablet (50 mg total) by mouth 2 (two) times daily. Patient taking differently: Take 100 mg by mouth at bedtime. For anxiety and sleep 12/25/23   Lewie Chamber, MD  sucralfate (CARAFATE) 1 g tablet Take 1 g by mouth 4 (four) times daily -  with meals and at bedtime. Patient not taking: Reported on 01/11/2024    [provider]  sucralfate (CARAFATE) 1 g tablet Take 1 tablet (1 g total) by mouth 4 (four) times daily -   with meals and at bedtime. Patient not taking: Reported on 01/28/2024 01/19/24   Horton, Mayer Masker, MD  famotidine (PEPCID) 20 MG tablet Take 1 tablet (20 mg total) by mouth 2 (two) times daily. Patient not taking: Reported on 06/01/2019 05/14/19 06/02/19  Benjiman Core, MD      Allergies    Tegretol [carbamazepine] and Caffeine    Review of Systems   Review of Systems  Gastrointestinal:  Positive for abdominal pain.    Physical Exam Updated Vital Signs BP 106/71   Pulse 84   Temp 99 F (37.2 C) (Oral)   Resp 16   Ht 6' (1.829 m)   Wt 62.6 kg   SpO2 99%   BMI 18.72 kg/m  Physical Exam Vitals and nursing note reviewed.  Constitutional:      Appearance: He is well-developed.  HENT:     Head: Normocephalic and atraumatic.  Eyes:     Pupils: Pupils are equal, round, and reactive to light.  Neck:     Vascular: No JVD.  Cardiovascular:     Rate and Rhythm: Normal rate and regular rhythm.     Heart sounds: No murmur heard.    No friction rub. No gallop.  Pulmonary:     Effort: No respiratory distress.     Breath sounds: No wheezing.  Abdominal:     General: There is no distension.     Tenderness: There  is abdominal tenderness. There is no guarding or rebound.     Comments: Diffuse abdominal discomfort on exam.   Musculoskeletal:        General: Normal range of motion.     Cervical back: Normal range of motion and neck supple.  Skin:    Coloration: Skin is not pale.     Findings: No rash.  Neurological:     Mental Status: He is alert and oriented to person, place, and time.  Psychiatric:        Behavior: Behavior normal.     ED Results / Procedures / Treatments   Labs (all labs ordered are listed, but only abnormal results are displayed) Labs Reviewed  CBC WITH DIFFERENTIAL/PLATELET - Abnormal; Notable for the following components:      Result Value   WBC 3.6 (*)    RBC 3.71 (*)    Hemoglobin 11.5 (*)    HCT 34.2 (*)    All other components within normal  limits  COMPREHENSIVE METABOLIC PANEL WITH GFR - Abnormal; Notable for the following components:   Glucose, Bld 108 (*)    Total Protein 6.3 (*)    All other components within normal limits  LIPASE, BLOOD - Abnormal; Notable for the following components:   Lipase 439 (*)    All other components within normal limits    EKG None  Radiology No results found.  Procedures .Critical Care  Performed by: Melene Plan, DO Authorized by: Melene Plan, DO   Critical care provider statement:    Critical care time (minutes):  35   Critical care time was exclusive of:  Separately billable procedures and treating other patients   Critical care was time spent personally by me on the following activities:  Development of treatment plan with patient or surrogate, discussions with consultants, evaluation of patient's response to treatment, examination of patient, ordering and review of laboratory studies, ordering and review of radiographic studies, ordering and performing treatments and interventions, pulse oximetry, re-evaluation of patient's condition and review of old charts   Care discussed with: admitting provider       Medications Ordered in ED Medications  HYDROmorphone (DILAUDID) injection 1 mg (has no administration in time range)  prochlorperazine (COMPAZINE) injection 10 mg (has no administration in time range)  sodium chloride 0.9 % bolus 1,000 mL (1,000 mLs Intravenous New Bag/Given 02/05/24 1947)  HYDROmorphone (DILAUDID) injection 1 mg (1 mg Intravenous Given 02/05/24 1947)  droperidol (INAPSINE) 2.5 MG/ML injection 1.25 mg (1.25 mg Intravenous Given 02/05/24 1945)  diazepam (VALIUM) injection 5 mg (5 mg Intravenous Given 02/05/24 1946)    ED Course/ Medical Decision Making/ A&P                                 Medical Decision Making Amount and/or Complexity of Data Reviewed Labs: ordered.  Risk Prescription drug management.   38 yo M with a chief complaints of abdominal pain  and nausea and vomiting.  Patient is well-known to this emergency department with 35 visits in the past 6 months.  He was actually just admitted and left AGAINST MEDICAL ADVICE yesterday.  Will recheck labs here today.  Attempted control pain and nausea.  Reassess.  Patient's lipase is elevated but about where it is for him at baseline.  No significant change to his LFTs.  On repeat assessment patient still does not feel like he can tolerate by mouth and still is  complaining of severe pain.  Will discuss with medicine for possible admission.  The patients results and plan were reviewed and discussed.   Any x-rays performed were independently reviewed by myself.   Differential diagnosis were considered with the presenting HPI.  Medications  HYDROmorphone (DILAUDID) injection 1 mg (has no administration in time range)  prochlorperazine (COMPAZINE) injection 10 mg (has no administration in time range)  sodium chloride 0.9 % bolus 1,000 mL (1,000 mLs Intravenous New Bag/Given 02/05/24 1947)  HYDROmorphone (DILAUDID) injection 1 mg (1 mg Intravenous Given 02/05/24 1947)  droperidol (INAPSINE) 2.5 MG/ML injection 1.25 mg (1.25 mg Intravenous Given 02/05/24 1945)  diazepam (VALIUM) injection 5 mg (5 mg Intravenous Given 02/05/24 1946)    Vitals:   02/05/24 1830 02/05/24 1835 02/05/24 1930  BP: 108/71  106/71  Pulse: 88  84  Resp: 15  16  Temp: 98.7 F (37.1 C)  99 F (37.2 C)  TempSrc: Oral  Oral  SpO2: 100%  99%  Weight:  62.6 kg   Height:  6' (1.829 m)     Final diagnoses:  Alcohol-induced chronic pancreatitis (HCC)    Admission/ observation were discussed with the admitting physician, patient and/or family and they are comfortable with the plan.          Final Clinical Impression(s) / ED Diagnoses Final diagnoses:  Alcohol-induced chronic pancreatitis [facility])    Rx / DC Orders ED Discharge Orders     None         Melene Plan, DO 02/05/24 2245

## 2024-02-05 NOTE — Consult Note (Signed)
 Initial Consultation Note   Patient: Gary [name] ZOX:[id number] DOB: [birth year] PCP: Pcp, No DOA: 02/05/2024 DOS: the patient was seen and examined on 02/05/2024  Primary service: Melene Plan, DO; ED   Referring physician: Melene Plan, DO Reason for consult: Acute on chronic pancreatitis   Assessment/Plan: Assessment and Plan:  38 year old male with a history of chronic pancreatitis with frequent hospitalizations for acute on chronic pancreatitis in the setting of ongoing alcohol abuse, HIV/AIDS, seizure disorder, polysubstance use disorder, bipolar 2, schizophrenia, PTSD, homelessness who presents with worsening epigastric abdominal pain in the setting of relapsed alcohol use.  TRH consulted for possible admission for acute on chronic pancreatitis.  At this time although he does appear to have acute on chronic pancreatitis I am somewhat concerned about his frequent re: hospitalizations and possible malingering to obtain IV benzodiazepine and IV opiate medications.  At this time his exam is reassuring.  I recommend for continued observation in the emergency department and have discussed with the patient my recommendation to avoid IV opioid medication and instead use low-dose oral opiates and allowed to have clear liquid diet along with p.o. pain medication.  Continue to observe for alcohol withdrawal and treat if this develops.   Acute on chronic pancreatitis, alcohol related Frequent re: hospitalizations and emergency department visits for the same.  Vitals are normal, exam with exaggerated and delayed response to abdominal palpation with no peritoneal signs. His lipase is 439 which is decreasing from 3/27 (peaked at 728) although up from yesterday. And his LFT are normal. 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  Follow-up liver duplex with patent portal vein. Given nature of  his frequent rehospitalizations and known substance use disorder, considering his reassuring exam and overall decrease in lipase from peak at last hospitalization hopefully he will have improvement with conservative care and abstinence from alcohol use. -At this time recommend for continued observation in the emergency department with avoidance of IV opiate medications.  Please instead use low-dose oral opiate medications. I have ordered for Oxycodone 2.5 / 5 mg for moderate / severe pain q 6 hr as needed.  -Okay for clear liquid diet, Zofran and compazine as needed for antiemetic  -Continue maintenance IV fluid with normal saline at 150 cc an hour until taking in adequate p.o.'s -At this time do not feel requires additional CT imaging -Would recommend GI consultation in the morning given his recurrent pancreatitis.  May benefit from medications such as TCA for chronic pancreatitis pain, although may benefit from psychiatry input given his psychiatric conditions.   Alcohol use disorder, at risk for alcohol withdrawal - CIWA monitoring, hold off on as needed Ativan order unless CIWA's become elevated with objective evidence of withdrawal. - Would offer him oral naltrexone for medication assisted therapy for alcohol use disorder.  Can start 50 mg daily if he is interested in pursuing alcohol cessation.   Polysubstance use disorder - Send urine for quantitative opiates.  Send tox screen  - TOC involvement in morning for resources for substance use  Chronic medical problems: HIV/AIDS: Continue home Biktarvy, ID follow-up outpatient seizure disorder: Resume home Keppra  Bipolar 2, Schizophrenia, PTSD:  Resume on Seroquel BID  Homelessness: TOC involvement.   TRH will sign off at present, please call us again when needed.; Please call if worsening alcohol withdrawal requiring admission, inability to tolerate POs, or if any other concerns.    ===========================================================  CC: Abdominal pain   HPI: Gary [name] is a 38 y.o. male with chronic pancreatitis with frequent hospitalizations for acute on chronic pancreatitis in the setting of ongoing alcohol abuse, HIV/AIDS, seizure disorder, polysubstance use disorder, bipolar 2, schizophrenia, PTSD, homelessness who presents with worsening epigastric abdominal pain in the setting of relapsed alcohol use.  TRH consulted for possible admission for acute on chronic pancreatitis.  Patient was recently admitted 3/27-28 for treatment of acute on chronic pancreatitis and alcohol withdrawal left AGAINST MEDICAL ADVICE due to inadequate pain control, please see nursing note from yesterday.  He was elusive about reasons for wanting to go AGAINST MEDICAL ADVICE during our conversation and denied that this was due to pain.  Reports that he has had worsening epigastric pain radiating to the back since this morning.  States he drank 1/5 of liquor since being out of the hospital.  Reports having nausea and vomiting and limited tolerance of p.o.'s.  He is having diarrhea.  Denies any other illness.  Review of Systems: As mentioned in the history of present illness. All other systems reviewed and are negative. Past Medical History:  Diagnosis Date   Alcohol abuse    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   Anxiety    Bipolar 2 disorder (HCC)    HIV (human immunodeficiency virus infection) (HCC)    Pancreatitis    PTSD (post-traumatic stress disorder)    Schizophrenia (HCC)    Seizures (HCC)    Subdural hematoma (HCC)    Past Surgical History:  Procedure Laterality Date   BIOPSY  04/19/2022   Procedure: BIOPSY;  Surgeon: Lemar Lofty., MD;  Location: [facility] ENDOSCOPY;  Service: Gastroenterology;;   ENTEROSCOPY N/A 04/19/2022   Procedure: ENTEROSCOPY;  Surgeon: Lemar Lofty., MD;  Location: [facility] ENDOSCOPY;  Service: Gastroenterology;  Laterality:  N/A;   INCISION AND DRAINAGE PERIRECTAL ABSCESS N/A 09/24/2016   Procedure: IRRIGATION AND DEBRIDEMENT PERIRECTAL ABSCESS;  Surgeon: Ricarda Frame, MD;  Location: [facility] ORS;  Service: General;  Laterality: N/A;   none     Social History:  reports that he has been smoking cigarettes. He started smoking about 21 years ago. He has a 10.6 pack-year smoking history. He has never used smokeless tobacco. He reports current alcohol use of about 105.0 standard drinks of alcohol per week. He reports that he does not currently use drugs after having used the following drugs: Methamphetamines and Cocaine.  Allergies  Allergen Reactions   Tegretol [Carbamazepine] Other (See Comments)    Vertigo  Paralysis   Caffeine Palpitations    Family History  Problem Relation Age of Onset   Alcohol abuse Mother    Alcohol abuse Father    Colon cancer Other    Other Other    Cancer Other     Prior to Admission medications   Medication Sig Start Date End Date Taking? Authorizing Provider  bictegravir-emtricitabine-tenofovir AF (BIKTARVY) 50-200-25 MG TABS tablet Take 1 tablet by mouth daily. 01/28/24   Vu, Tonita Phoenix, MD  hydrOXYzine (VISTARIL) 25 MG capsule Take 1 capsule (25 mg total) by mouth 3 (three) times daily as needed. Patient not taking: Reported on 02/02/2024 01/28/24 02/27/24  Rutha Bouchard T, MD  levETIRAcetam (KEPPRA) 500 MG tablet Take 1 tablet (500 mg total) by mouth 2 (two) times daily. Patient not taking: Reported on 02/02/2024 11/18/23   Gunnar Bulla, MD  ondansetron (ZOFRAN-ODT) 4 MG disintegrating tablet Dissolve 1 tablet (4 mg total) by mouth every 8 (eight) hours as needed for nausea or  vomiting. Patient not taking: Reported on 12/31/2023 12/29/23   Gailen Shelter, PA  QUEtiapine (SEROQUEL) 50 MG tablet Take 1 tablet (50 mg total) by mouth 2 (two) times daily. Patient taking differently: Take 100 mg by mouth at bedtime. For anxiety and sleep 12/25/23   Lewie Chamber, MD  sucralfate (CARAFATE) 1 g  tablet Take 1 g by mouth 4 (four) times daily -  with meals and at bedtime. Patient not taking: Reported on 01/11/2024    [provider]  sucralfate (CARAFATE) 1 g tablet Take 1 tablet (1 g total) by mouth 4 (four) times daily -  with meals and at bedtime. Patient not taking: Reported on 01/28/2024 01/19/24   Horton, Mayer Masker, MD  famotidine (PEPCID) 20 MG tablet Take 1 tablet (20 mg total) by mouth 2 (two) times daily. Patient not taking: Reported on 06/01/2019 05/14/19 06/02/19  Benjiman Core, MD    Physical Exam: Vitals:   02/05/24 1830 02/05/24 1835 02/05/24 1930  BP: 108/71  106/71  Pulse: 88  84  Resp: 15  16  Temp: 98.7 F (37.1 C)  99 F (37.2 C)  TempSrc: Oral  Oral  SpO2: 100%  99%  Weight:  62.6 kg   Height:  6' (1.829 m)     Gen: Awake, alert, chronically ill-appearing, malnourished CV: Regular, normal S1, S2, no murmurs  Resp: Normal WOB, CTAB  Abd: Scaphoid, hyperactive, there is delayed and exaggerated response to abdominal palpation, distractible exam, there is no rebound, guarding, rigidity. MSK: Symmetric, no edema  Skin: No rashes or lesions to exposed skin  Neuro: Alert and interactive  Psych: euthymic, appropriate    Data Reviewed:   Cr 0.6 up from prior  Lipase 439  WBC 3.6   Reviewed recent CT imaging as discussed in A/P   Family Communication: No  Primary team communication: Yes discussed with ED providers   Thank you very much for involving [place] in the care of your patient.  Author: Dolly Rias, MD 02/05/2024 11:04 PM  For on call review [email or web address].

## 2024-02-06 LAB — RAPID URINE DRUG SCREEN, HOSP PERFORMED
Amphetamines: NOT DETECTED
Barbiturates: NOT DETECTED
Benzodiazepines: POSITIVE — AB
Cocaine: NOT DETECTED
Opiates: POSITIVE — AB
Tetrahydrocannabinol: NOT DETECTED

## 2024-02-06 LAB — CBC
HCT: 30 % — ABNORMAL LOW (ref 39.0–52.0)
Hemoglobin: 9.9 g/dL — ABNORMAL LOW (ref 13.0–17.0)
MCH: 30.7 pg (ref 26.0–34.0)
MCHC: 33 g/dL (ref 30.0–36.0)
MCV: 92.9 fL (ref 80.0–100.0)
Platelets: 135 10*3/uL — ABNORMAL LOW (ref 150–400)
RBC: 3.23 MIL/uL — ABNORMAL LOW (ref 4.22–5.81)
RDW: 14.1 % (ref 11.5–15.5)
WBC: 3.4 10*3/uL — ABNORMAL LOW (ref 4.0–10.5)
nRBC: 0 % (ref 0.0–0.2)

## 2024-02-06 LAB — BASIC METABOLIC PANEL WITH GFR
Anion gap: 5 (ref 5–15)
BUN: 6 mg/dL (ref 6–20)
CO2: 25 mmol/L (ref 22–32)
Calcium: 7.7 mg/dL — ABNORMAL LOW (ref 8.9–10.3)
Chloride: 110 mmol/L (ref 98–111)
Creatinine, Ser: 0.51 mg/dL — ABNORMAL LOW (ref 0.61–1.24)
GFR, Estimated: >60 mL/min (ref >60)
Glucose, Bld: 79 mg/dL (ref 70–99)
Potassium: 3.4 mmol/L — ABNORMAL LOW (ref 3.5–5.1)
Sodium: 140 mmol/L (ref 135–145)

## 2024-02-06 LAB — PHOSPHORUS: Phosphorus: 4.5 mg/dL (ref 2.5–4.6)

## 2024-02-06 LAB — MAGNESIUM: Magnesium: 1.8 mg/dL (ref 1.7–2.4)

## 2024-02-06 MED ORDER — OXYCODONE-ACETAMINOPHEN 5-325 MG PO TABS
1.0000 | ORAL_TABLET | Freq: Once | ORAL | Status: AC
  Administered 2024-02-06: 1 via ORAL
  Filled 2024-02-06: qty 1

## 2024-02-06 NOTE — Discharge Instructions (Addendum)
 Please follow up with the gastroenterologist listed below. Youshould begin with liquid diet and progress slowly to a normal diet. Return to the ER with any new severe symptoms.

## 2024-02-06 NOTE — ED Notes (Signed)
 Pt provided with apple sauce and water and apple juice for PO challenge. Pt ate entire applesauce container and shortly after complained of worsening abdominal pain. PA Sponseller made aware.

## 2024-02-13 ENCOUNTER — Encounter (HOSPITAL_COMMUNITY): Payer: Self-pay | Admitting: Emergency Medicine

## 2024-02-13 ENCOUNTER — Emergency Department (HOSPITAL_COMMUNITY)
Admission: EM | Admit: 2024-02-13 | Discharge: 2024-02-13 | Disposition: A | Discharge status: Home / Self Care | Payer: MEDICAID | Attending: Emergency Medicine | Admitting: Emergency Medicine

## 2024-02-13 ENCOUNTER — Inpatient Hospital Stay (HOSPITAL_COMMUNITY)
Admission: EM | Admit: 2024-02-13 | Discharge: 2024-02-17 | DRG: 438 | Disposition: A | Discharge status: Home / Self Care | Payer: MEDICAID | Attending: Internal Medicine | Admitting: Internal Medicine

## 2024-02-13 ENCOUNTER — Encounter (HOSPITAL_COMMUNITY): Payer: Self-pay

## 2024-02-13 ENCOUNTER — Other Ambulatory Visit: Payer: Self-pay

## 2024-02-13 ENCOUNTER — Emergency Department (HOSPITAL_COMMUNITY): Payer: MEDICAID

## 2024-02-13 DIAGNOSIS — F17200 Nicotine dependence, unspecified, uncomplicated: Secondary | ICD-10-CM | POA: Diagnosis present

## 2024-02-13 DIAGNOSIS — K852 Alcohol induced acute pancreatitis without necrosis or infection: Principal | ICD-10-CM

## 2024-02-13 DIAGNOSIS — Z21 Asymptomatic human immunodeficiency virus [HIV] infection status: Secondary | ICD-10-CM | POA: Insufficient documentation

## 2024-02-13 DIAGNOSIS — G8929 Other chronic pain: Secondary | ICD-10-CM | POA: Insufficient documentation

## 2024-02-13 DIAGNOSIS — K869 Disease of pancreas, unspecified: Secondary | ICD-10-CM | POA: Diagnosis present

## 2024-02-13 DIAGNOSIS — Z811 Family history of alcohol abuse and dependence: Secondary | ICD-10-CM

## 2024-02-13 DIAGNOSIS — Y903 Blood alcohol level of 60-79 mg/100 ml: Secondary | ICD-10-CM | POA: Diagnosis present

## 2024-02-13 DIAGNOSIS — R112 Nausea with vomiting, unspecified: Secondary | ICD-10-CM | POA: Diagnosis present

## 2024-02-13 DIAGNOSIS — F3181 Bipolar II disorder: Secondary | ICD-10-CM | POA: Diagnosis present

## 2024-02-13 DIAGNOSIS — F419 Anxiety disorder, unspecified: Secondary | ICD-10-CM | POA: Diagnosis present

## 2024-02-13 DIAGNOSIS — K860 Alcohol-induced chronic pancreatitis: Secondary | ICD-10-CM | POA: Diagnosis present

## 2024-02-13 DIAGNOSIS — F10129 Alcohol abuse with intoxication, unspecified: Secondary | ICD-10-CM | POA: Diagnosis present

## 2024-02-13 DIAGNOSIS — K859 Acute pancreatitis without necrosis or infection, unspecified: Secondary | ICD-10-CM | POA: Diagnosis present

## 2024-02-13 DIAGNOSIS — Z800 Family history of malignant neoplasm of digestive organs: Secondary | ICD-10-CM

## 2024-02-13 DIAGNOSIS — R1084 Generalized abdominal pain: Secondary | ICD-10-CM | POA: Insufficient documentation

## 2024-02-13 DIAGNOSIS — G40909 Epilepsy, unspecified, not intractable, without status epilepticus: Secondary | ICD-10-CM | POA: Diagnosis present

## 2024-02-13 DIAGNOSIS — F209 Schizophrenia, unspecified: Secondary | ICD-10-CM | POA: Diagnosis present

## 2024-02-13 DIAGNOSIS — Z79899 Other long term (current) drug therapy: Secondary | ICD-10-CM

## 2024-02-13 DIAGNOSIS — K2091 Esophagitis, unspecified with bleeding: Secondary | ICD-10-CM | POA: Diagnosis present

## 2024-02-13 DIAGNOSIS — F4310 Post-traumatic stress disorder, unspecified: Secondary | ICD-10-CM | POA: Diagnosis present

## 2024-02-13 LAB — CBC WITH DIFFERENTIAL/PLATELET
Abs Immature Granulocytes: 0.01 K/uL (ref 0.00–0.07)
Basophils Absolute: 0 K/uL (ref 0.0–0.1)
Basophils Relative: 1 %
Eosinophils Absolute: 0 K/uL (ref 0.0–0.5)
Eosinophils Relative: 1 %
HCT: 39.9 % (ref 39.0–52.0)
Hemoglobin: 13.6 g/dL (ref 13.0–17.0)
Immature Granulocytes: 0 %
Lymphocytes Relative: 21 %
Lymphs Abs: 1 K/uL (ref 0.7–4.0)
MCH: 31 pg (ref 26.0–34.0)
MCHC: 34.1 g/dL (ref 30.0–36.0)
MCV: 90.9 fL (ref 80.0–100.0)
Monocytes Absolute: 0.4 K/uL (ref 0.1–1.0)
Monocytes Relative: 8 %
Neutro Abs: 3.5 K/uL (ref 1.7–7.7)
Neutrophils Relative %: 69 %
Platelets: 227 K/uL (ref 150–400)
RBC: 4.39 MIL/uL (ref 4.22–5.81)
RDW: 14.5 % (ref 11.5–15.5)
WBC: 5 K/uL (ref 4.0–10.5)
nRBC: 0 % (ref 0.0–0.2)

## 2024-02-13 LAB — TYPE AND SCREEN
ABO/RH(D): A POS
Antibody Screen: NEGATIVE

## 2024-02-13 LAB — ETHANOL: Alcohol, Ethyl (B): 76 mg/dL — ABNORMAL HIGH (ref <10)

## 2024-02-13 LAB — COMPREHENSIVE METABOLIC PANEL WITH GFR
ALT: 15 U/L (ref 0–44)
AST: 22 U/L (ref 15–41)
Albumin: 4.4 g/dL (ref 3.5–5.0)
Alkaline Phosphatase: 72 U/L (ref 38–126)
Anion gap: 16 — ABNORMAL HIGH (ref 5–15)
BUN: 10 mg/dL (ref 6–20)
CO2: 23 mmol/L (ref 22–32)
Calcium: 9.4 mg/dL (ref 8.9–10.3)
Chloride: 103 mmol/L (ref 98–111)
Creatinine, Ser: 0.52 mg/dL — ABNORMAL LOW (ref 0.61–1.24)
GFR, Estimated: >60 mL/min
Glucose, Bld: 113 mg/dL — ABNORMAL HIGH (ref 70–99)
Potassium: 3.7 mmol/L (ref 3.5–5.1)
Sodium: 142 mmol/L (ref 135–145)
Total Bilirubin: 0.8 mg/dL (ref 0.0–1.2)
Total Protein: 8.3 g/dL — ABNORMAL HIGH (ref 6.5–8.1)

## 2024-02-13 LAB — PROTIME-INR
INR: 1.1 (ref 0.8–1.2)
Prothrombin Time: 14.3 s (ref 11.4–15.2)

## 2024-02-13 LAB — LIPASE, BLOOD: Lipase: 279 U/L — ABNORMAL HIGH (ref 11–51)

## 2024-02-13 MED ORDER — HYDROMORPHONE HCL 1 MG/ML IJ SOLN
1.0000 mg | Freq: Once | INTRAMUSCULAR | Status: AC
  Administered 2024-02-13: 1 mg via INTRAVENOUS
  Filled 2024-02-13: qty 1

## 2024-02-13 MED ORDER — HYDROMORPHONE HCL 1 MG/ML IJ SOLN
1.0000 mg | INTRAMUSCULAR | Status: AC | PRN
  Administered 2024-02-13 – 2024-02-14 (×3): 1 mg via INTRAVENOUS
  Filled 2024-02-13 (×3): qty 1

## 2024-02-13 MED ORDER — LACTATED RINGERS IV BOLUS
1000.0000 mL | Freq: Once | INTRAVENOUS | Status: AC
  Administered 2024-02-13: 1000 mL via INTRAVENOUS

## 2024-02-13 MED ORDER — BICTEGRAVIR-EMTRICITAB-TENOFOV 50-200-25 MG PO TABS
1.0000 | ORAL_TABLET | Freq: Every day | ORAL | Status: DC
  Administered 2024-02-13 – 2024-02-17 (×5): 1 via ORAL
  Filled 2024-02-13 (×5): qty 1

## 2024-02-13 MED ORDER — LORAZEPAM 1 MG PO TABS
1.0000 mg | ORAL_TABLET | ORAL | Status: AC | PRN
  Administered 2024-02-14 – 2024-02-15 (×3): 2 mg via ORAL
  Filled 2024-02-13 (×3): qty 2

## 2024-02-13 MED ORDER — ONDANSETRON HCL 4 MG/2ML IJ SOLN
4.0000 mg | Freq: Four times a day (QID) | INTRAMUSCULAR | Status: DC | PRN
  Administered 2024-02-13 – 2024-02-15 (×3): 4 mg via INTRAVENOUS
  Filled 2024-02-13 (×3): qty 2

## 2024-02-13 MED ORDER — LORAZEPAM 1 MG PO TABS
2.0000 mg | ORAL_TABLET | Freq: Once | ORAL | Status: AC
  Administered 2024-02-13: 2 mg via ORAL
  Filled 2024-02-13: qty 2

## 2024-02-13 MED ORDER — PANTOPRAZOLE SODIUM 40 MG IV SOLR
40.0000 mg | Freq: Once | INTRAVENOUS | Status: AC
  Administered 2024-02-13: 40 mg via INTRAVENOUS
  Filled 2024-02-13: qty 10

## 2024-02-13 MED ORDER — THIAMINE MONONITRATE 100 MG PO TABS
100.0000 mg | ORAL_TABLET | Freq: Every day | ORAL | Status: DC
  Administered 2024-02-13 – 2024-02-17 (×5): 100 mg via ORAL
  Filled 2024-02-13 (×5): qty 1

## 2024-02-13 MED ORDER — LACTATED RINGERS IV SOLN: INTRAVENOUS | Status: DC

## 2024-02-13 MED ORDER — LORAZEPAM 2 MG/ML IJ SOLN
1.0000 mg | INTRAMUSCULAR | Status: AC | PRN
  Administered 2024-02-13 – 2024-02-15 (×7): 2 mg via INTRAVENOUS
  Administered 2024-02-15: 4 mg via INTRAVENOUS
  Administered 2024-02-15 – 2024-02-16 (×3): 2 mg via INTRAVENOUS
  Filled 2024-02-13 (×4): qty 1
  Filled 2024-02-13: qty 2
  Filled 2024-02-13 (×6): qty 1

## 2024-02-13 MED ORDER — ADULT MULTIVITAMIN W/MINERALS CH
1.0000 | ORAL_TABLET | Freq: Every day | ORAL | Status: DC
  Administered 2024-02-13 – 2024-02-17 (×5): 1 via ORAL
  Filled 2024-02-13 (×5): qty 1

## 2024-02-13 MED ORDER — FOLIC ACID 1 MG PO TABS
1.0000 mg | ORAL_TABLET | Freq: Every day | ORAL | Status: DC
  Administered 2024-02-13 – 2024-02-17 (×5): 1 mg via ORAL
  Filled 2024-02-13 (×5): qty 1

## 2024-02-13 MED ORDER — QUETIAPINE FUMARATE 25 MG PO TABS
50.0000 mg | ORAL_TABLET | Freq: Two times a day (BID) | ORAL | Status: DC
  Administered 2024-02-13 – 2024-02-17 (×8): 50 mg via ORAL
  Filled 2024-02-13 (×8): qty 2

## 2024-02-13 MED ORDER — THIAMINE HCL 100 MG/ML IJ SOLN
100.0000 mg | Freq: Every day | INTRAMUSCULAR | Status: DC
  Filled 2024-02-13 (×2): qty 2

## 2024-02-13 MED ORDER — PANTOPRAZOLE SODIUM 40 MG IV SOLR
40.0000 mg | Freq: Two times a day (BID) | INTRAVENOUS | Status: DC
  Administered 2024-02-13 – 2024-02-17 (×8): 40 mg via INTRAVENOUS
  Filled 2024-02-13 (×8): qty 10

## 2024-02-13 MED ORDER — ONDANSETRON HCL 4 MG/2ML IJ SOLN
4.0000 mg | Freq: Once | INTRAMUSCULAR | Status: AC
  Administered 2024-02-13: 4 mg via INTRAVENOUS
  Filled 2024-02-13: qty 2

## 2024-02-13 MED ORDER — SODIUM CHLORIDE 0.9 % IV SOLN
25.0000 mg | Freq: Once | INTRAVENOUS | Status: AC
  Administered 2024-02-13: 25 mg via INTRAVENOUS
  Filled 2024-02-13: qty 25

## 2024-02-13 NOTE — ED Triage Notes (Signed)
 Pt brought by EMS from home. Pt with complains of abdominal pain accomapanied by N/V for 1 day. Pt have history of pancreatitis last alcohol intake yesterday.

## 2024-02-13 NOTE — ED Provider Notes (Signed)
 [place] EMERGENCY DEPARTMENT AT [facility] Provider Note   CSN: [id number] Arrival date & time: 02/13/24  7829     History  No chief complaint on file.   Gary [name] is a 38 y.o. male.  HPI 38 year old male presents with hematemesis and abdominal pain.  Symptoms started last night.  Last drank yesterday during the day.  States he was drinking red wine and now has vomiting blood.  He states it tastes like blood and he does not think is the red wine coming back up.  He is also having abdominal pain similar to prior pancreatitis.  His chest is hurting as well.  Home Medications Prior to Admission medications   Medication Sig Start Date End Date Taking? Authorizing Provider  bictegravir-emtricitabine-tenofovir AF (BIKTARVY) 50-200-25 MG TABS tablet Take 1 tablet by mouth daily. 01/28/24   Vu, Tonita Phoenix, MD  hydrOXYzine (VISTARIL) 25 MG capsule Take 1 capsule (25 mg total) by mouth 3 (three) times daily as needed. Patient not taking: Reported on 02/02/2024 01/28/24 02/27/24  Rutha Bouchard T, MD  levETIRAcetam (KEPPRA) 500 MG tablet Take 1 tablet (500 mg total) by mouth 2 (two) times daily. Patient not taking: Reported on 02/02/2024 11/18/23   Gunnar Bulla, MD  ondansetron (ZOFRAN-ODT) 4 MG disintegrating tablet Dissolve 1 tablet (4 mg total) by mouth every 8 (eight) hours as needed for nausea or vomiting. Patient not taking: Reported on 12/31/2023 12/29/23   Gailen Shelter, PA  QUEtiapine (SEROQUEL) 50 MG tablet Take 1 tablet (50 mg total) by mouth 2 (two) times daily. Patient taking differently: Take 100 mg by mouth at bedtime. For anxiety and sleep 12/25/23   Lewie Chamber, MD  sucralfate (CARAFATE) 1 g tablet Take 1 g by mouth 4 (four) times daily -  with meals and at bedtime. Patient not taking: Reported on 01/11/2024    [provider]  sucralfate (CARAFATE) 1 g tablet Take 1 tablet (1 g total) by mouth 4 (four) times daily -  with meals and at bedtime. Patient not  taking: Reported on 01/28/2024 01/19/24   Horton, Mayer Masker, MD  famotidine (PEPCID) 20 MG tablet Take 1 tablet (20 mg total) by mouth 2 (two) times daily. Patient not taking: Reported on 06/01/2019 05/14/19 06/02/19  Benjiman Core, MD      Allergies    Tegretol [carbamazepine] and Caffeine    Review of Systems   Review of Systems  Cardiovascular:  Positive for chest pain.  Gastrointestinal:  Positive for abdominal pain and vomiting.    Physical Exam Updated Vital Signs BP (!) 135/96 (BP Location: Left Arm)   Pulse 94   Temp 98.3 F (36.8 C) (Oral)   Resp 16   SpO2 98%  Physical Exam Vitals and nursing note reviewed.  Constitutional:      Appearance: He is well-developed.  HENT:     Head: Normocephalic and atraumatic.  Cardiovascular:     Rate and Rhythm: Regular rhythm. Tachycardia present.     Heart sounds: Normal heart sounds.     Comments: HR~100 Pulmonary:     Effort: Pulmonary effort is normal.  Abdominal:     Palpations: Abdomen is soft.     Tenderness: There is generalized abdominal tenderness.  Skin:    General: Skin is warm and dry.  Neurological:     Mental Status: He is alert.     ED Results / Procedures / Treatments   Labs (all labs ordered are listed, but only abnormal results  are displayed) Labs Reviewed  COMPREHENSIVE METABOLIC PANEL WITH GFR - Abnormal; Notable for the following components:      Result Value   Glucose, Bld 113 (*)    Creatinine, Ser 0.52 (*)    Total Protein 8.3 (*)    Anion gap 16 (*)    All other components within normal limits  ETHANOL - Abnormal; Notable for the following components:   Alcohol, Ethyl (B) 76 (*)    All other components within normal limits  LIPASE, BLOOD - Abnormal; Notable for the following components:   Lipase 279 (*)    All other components within normal limits  CBC WITH DIFFERENTIAL/PLATELET  PROTIME-INR  TYPE AND SCREEN    EKG EKG Interpretation Date/Time:  Sunday February 13 2024 11:00:32  EDT Ventricular Rate:  85 PR Interval:  132 QRS Duration:  100 QT Interval:  412 QTC Calculation: 490 R Axis:   57  Text Interpretation: Sinus rhythm Borderline prolonged QT interval Baseline wander in lead(s) V4 Confirmed by Pricilla Loveless [phone]) on 02/13/2024 11:18:34 AM  Radiology DG Chest Portable 1 View Result Date: 02/13/2024 CLINICAL DATA:  Chest pain. EXAM: PORTABLE CHEST 1 VIEW COMPARISON:  Chest radiograph dated 11/25/2023. FINDINGS: Similar rotation to the left. The heart size and mediastinal contours are unchanged. Similar mild elevation of the left hemidiaphragm. No focal consolidation, pleural effusion, or pneumothorax. No acute osseous abnormality. IMPRESSION: No acute cardiopulmonary findings. Electronically Signed   By: Hart Robinsons M.D.   On: 02/13/2024 10:38    Procedures Procedures    Medications Ordered in ED Medications  ondansetron (ZOFRAN) injection 4 mg (4 mg Intravenous Given 02/13/24 1052)  lactated ringers bolus 1,000 mL (0 mLs Intravenous Stopped 02/13/24 1322)  HYDROmorphone (DILAUDID) injection 1 mg (1 mg Intravenous Given 02/13/24 1053)  LORazepam (ATIVAN) tablet 2 mg (2 mg Oral Given 02/13/24 1158)  pantoprazole (PROTONIX) injection 40 mg (40 mg Intravenous Given 02/13/24 1402)  promethazine (PHENERGAN) 25 mg in sodium chloride 0.9 % 50 mL IVPB (0 mg Intravenous Stopped 02/13/24 1436)    ED Course/ Medical Decision Making/ A&P                                 Medical Decision Making Amount and/or Complexity of Data Reviewed Labs: ordered.    Details: Normal WBC, elevated lipase Radiology: ordered and independent interpretation performed.    Details: No CHF ECG/medicine tests: ordered and independent interpretation performed.    Details: Sinus rhythm  Risk Prescription drug management. Decision regarding hospitalization.   Patient presents with recurrent pancreatitis and vomiting.  I doubt this is a GI bleed based on the emesis here.  Hemoglobin is  improved from baseline.  He was given fluids, pain, nausea medicine.  Unfortunately he is still vomiting.  He had numerous CTs with similar presentations and I think this is a recurrent episode of his pancreatitis induced by chronic alcohol abuse.  Will defer CT imaging for now.  Discussed with Dr. Avie Arenas, who will admit.        Final Clinical Impression(s) / ED Diagnoses Final diagnoses:  Alcohol-induced acute pancreatitis, unspecified complication status    Rx / DC Orders ED Discharge Orders     None         Pricilla Loveless, MD 02/13/24 [phone]

## 2024-02-13 NOTE — ED Triage Notes (Signed)
 Pt BIBA from [facility] with complains of abdominal pain and N/V. Pt have history of pancreatitis. Endorses vomiting dark red blood that started this morning. Painful LUQ. last alcohol intake yesterday. Denies any other symptoms

## 2024-02-13 NOTE — ED Provider Notes (Signed)
 [place] EMERGENCY DEPARTMENT AT [facility] Provider Note   CSN: [id number] Arrival date & time: 02/13/24  4098     History  Chief Complaint  Patient presents with   Abdominal Pain   Nausea    Gary [name] is a 38 y.o. male.  The history is provided by the patient.   Patient with extensive history including HIV with poor medication adherence, alcohol use disorder, chronic abdominal pain due to pancreatitis presents for abdominal pain and vomiting.  Patient reports over the past days had multiple episodes of nonbloody emesis.  He has had normal bowel movements without any blood or melena.  This feels similar to previous episodes of pancreatitis.  No urinary symptoms.  No fevers. Patient admits to drinking alcohol in the past 24 hours.    Home Medications Prior to Admission medications   Medication Sig Start Date End Date Taking? Authorizing Provider  bictegravir-emtricitabine-tenofovir AF (BIKTARVY) 50-200-25 MG TABS tablet Take 1 tablet by mouth daily. 01/28/24   Vu, Tonita Phoenix, MD  hydrOXYzine (VISTARIL) 25 MG capsule Take 1 capsule (25 mg total) by mouth 3 (three) times daily as needed. Patient not taking: Reported on 02/02/2024 01/28/24 02/27/24  Rutha Bouchard T, MD  levETIRAcetam (KEPPRA) 500 MG tablet Take 1 tablet (500 mg total) by mouth 2 (two) times daily. Patient not taking: Reported on 02/02/2024 11/18/23   Gunnar Bulla, MD  ondansetron (ZOFRAN-ODT) 4 MG disintegrating tablet Dissolve 1 tablet (4 mg total) by mouth every 8 (eight) hours as needed for nausea or vomiting. Patient not taking: Reported on 12/31/2023 12/29/23   Gailen Shelter, PA  QUEtiapine (SEROQUEL) 50 MG tablet Take 1 tablet (50 mg total) by mouth 2 (two) times daily. Patient taking differently: Take 100 mg by mouth at bedtime. For anxiety and sleep 12/25/23   Lewie Chamber, MD  sucralfate (CARAFATE) 1 g tablet Take 1 g by mouth 4 (four) times daily -  with meals and at bedtime. Patient not taking:  Reported on 01/11/2024    [provider]  sucralfate (CARAFATE) 1 g tablet Take 1 tablet (1 g total) by mouth 4 (four) times daily -  with meals and at bedtime. Patient not taking: Reported on 01/28/2024 01/19/24   Horton, Mayer Masker, MD  famotidine (PEPCID) 20 MG tablet Take 1 tablet (20 mg total) by mouth 2 (two) times daily. Patient not taking: Reported on 06/01/2019 05/14/19 06/02/19  Benjiman Core, MD      Allergies    Tegretol [carbamazepine] and Caffeine    Review of Systems   Review of Systems  Constitutional:  Negative for fever.  Gastrointestinal:  Positive for abdominal pain, nausea and vomiting. Negative for blood in stool.    Physical Exam Updated Vital Signs BP (!) 118/90   Pulse 72   Temp 98.2 F (36.8 C) (Oral)   Resp 16   Ht 1.829 m (6')   Wt 63.5 kg   SpO2 100%   BMI 18.99 kg/m  Physical Exam CONSTITUTIONAL: Disheveled, smells of alcohol, resting comfortably with a sheet over his head HEAD: Normocephalic/atraumatic EYES: EOMI/PERRL ENMT: Mucous membranes moist NECK: supple no meningeal signs CV: S1/S2 noted, no murmurs/rubs/gallops noted LUNGS: Lungs are clear to auscultation bilaterally, no apparent distress ABDOMEN: soft, mild diffuse tenderness, no rebound or guarding, bowel sounds noted throughout abdomen, no distention GU:no cva tenderness NEURO: Pt is awake/alert/appropriate, moves all extremitiesx4.  No facial droop.  No tremor EXTREMITIES: full ROM SKIN: warm, color normal PSYCH: no abnormalities  of mood noted, alert and oriented to situation  ED Results / Procedures / Treatments   Labs (all labs ordered are listed, but only abnormal results are displayed) Labs Reviewed - No data to display  EKG None  Radiology No results found.  Procedures Procedures    Medications Ordered in ED Medications - No data to display  ED Course/ Medical Decision Making/ A&P                                 Medical Decision Making  This  patient presents to the ED for concern of abdominal pain, this involves an extensive number of treatment options, and is a complaint that carries with it a high risk of complications and morbidity.  The differential diagnosis includes but is not limited to cholecystitis, cholelithiasis, pancreatitis, gastritis, peptic ulcer disease, , bowel obstruction, bowel perforation, diverticulitis, AAA, ischemic bowel    Comorbidities that complicate the patient evaluation: Patient's presentation is complicated by their history of HIV, pancreatitis, seizure disorder  Social Determinants of Health: Patient's  poor medication adherence, multiple ER visits   increases the complexity of managing their presentation  Additional history obtained: Records reviewed previous admission documents  Test Considered: CT imaging and labs were considered, but patient has had 33 ER visits in 6 months with multiple evaluations previously. Suspect this is a chronic process this patient has appropriate vital signs, no active vomiting, and overall unremarkable abdominal exam  Complexity of problems addressed: Patient's presentation is most consistent with  exacerbation of chronic illness  Disposition: After consideration of the diagnostic results and the patient's response to treatment,  I feel that the patent would benefit from discharge   .           Final Clinical Impression(s) / ED Diagnoses Final diagnoses:  Chronic abdominal pain    Rx / DC Orders ED Discharge Orders     None         Zadie Rhine, MD 02/13/24 [phone]

## 2024-02-13 NOTE — ED Notes (Signed)
 RN called 5th floor informing them that patient is on his way to the floor

## 2024-02-13 NOTE — ED Notes (Signed)
Pt given bus pass ?

## 2024-02-13 NOTE — ED Notes (Signed)
 Unable to get EKG at this time because patient will not be still or lay back

## 2024-02-13 NOTE — H&P (Signed)
 History and Physical    Gary [name]:[id number] DOB: 11-May-1986 DOA: 02/13/2024  PCP: Pcp, No   Chief Complaint: abd pain  HPI: Gary [name] is a 38 y.o. male with medical history significant of alcohol abuse complicated by recurrent pancreatitis, bipolar, HIV, schizophrenia, prior seizure who presented to the emergency department due to 2 abdominal pain and nausea.  Patient presented with nausea vomiting in setting of emesis.  Last drink was approximately 24 hours ago in the emergency department he was afebrile hemodynamically stable.  Labs were obtained which demonstrated AST 22, ALT 15, ethanol 76, WBC 5.0, hemoglobin 13.6, INR, lipase 279.  Patient underwent chest x-ray which showed no acute findings.  Patient was admitted further workup.  Of note patient presented to the ER on 3/29 with similar complaints and was thought to be malingering.  Hospitalist recommend GI consultation for chronic pancreatitis.  On my evaluation given elevated lipase and abdominal pain in setting of active nausea and vomiting.  Given pain control and IV fluids.  I counseled him on the severity of his pancreatitis and recommend complete alcohol cessation.  Patient amenable states that he wants to stop drinking   Review of Systems: Review of Systems  Constitutional: Negative.  Negative for chills.  HENT: Negative.    Eyes: Negative.   Respiratory: Negative.    Cardiovascular: Negative.   Gastrointestinal:  Positive for abdominal pain, nausea and vomiting.  Genitourinary: Negative.   Musculoskeletal: Negative.   Skin: Negative.   Neurological: Negative.   Endo/Heme/Allergies: Negative.   Psychiatric/Behavioral: Negative.       As per HPI otherwise 10 point review of systems negative.   Allergies  Allergen Reactions   Tegretol [Carbamazepine] Other (See Comments)    Vertigo  Paralysis   Caffeine Palpitations    Past Medical History:  Diagnosis Date   Alcohol abuse    Alcohol-induced  pancreatitis 04/16/2022   Anxiety    Bipolar 2 disorder (HCC)    HIV (human immunodeficiency virus infection) (HCC)    Pancreatitis    PTSD (post-traumatic stress disorder)    Schizophrenia (HCC)    Seizures (HCC)    Subdural hematoma (HCC)     Past Surgical History:  Procedure Laterality Date   BIOPSY  04/19/2022   Procedure: BIOPSY;  Surgeon: Lemar Lofty., MD;  Location: [facility] ENDOSCOPY;  Service: Gastroenterology;;   ENTEROSCOPY N/A 04/19/2022   Procedure: ENTEROSCOPY;  Surgeon: Lemar Lofty., MD;  Location: [facility] ENDOSCOPY;  Service: Gastroenterology;  Laterality: N/A;   INCISION AND DRAINAGE PERIRECTAL ABSCESS N/A 09/24/2016   Procedure: IRRIGATION AND DEBRIDEMENT PERIRECTAL ABSCESS;  Surgeon: Ricarda Frame, MD;  Location: [facility] ORS;  Service: General;  Laterality: N/A;   none       reports that he has been smoking cigarettes. He started smoking about 21 years ago. He has a 10.6 pack-year smoking history. He has never used smokeless tobacco. He reports current alcohol use of about 105.0 standard drinks of alcohol per week. He reports that he does not currently use drugs after having used the following drugs: Methamphetamines and Cocaine.  Family History  Problem Relation Age of Onset   Alcohol abuse Mother    Alcohol abuse Father    Colon cancer Other    Other Other    Cancer Other     Prior to Admission medications   Medication Sig Start Date End Date Taking? Authorizing Provider  bictegravir-emtricitabine-tenofovir AF (BIKTARVY) 50-200-25 MG TABS tablet Take 1 tablet by mouth daily. 01/28/24  Yes Vu, Tonita Phoenix, MD  QUEtiapine (SEROQUEL) 50 MG tablet Take 1 tablet (50 mg total) by mouth 2 (two) times daily. Patient taking differently: Take 100 mg by mouth at bedtime. For anxiety and sleep 12/25/23  Yes Lewie Chamber, MD  famotidine (PEPCID) 20 MG tablet Take 1 tablet (20 mg total) by mouth 2 (two) times daily. Patient not taking: Reported on 06/01/2019 05/14/19  06/02/19  Benjiman Core, MD    Physical Exam: Vitals:   02/13/24 0955 02/13/24 1357  BP: 131/74 (!) 135/96  Pulse: 94 94  Resp: 16 16  Temp: 98.6 F (37 C) 98.3 F (36.8 C)  TempSrc: Oral Oral  SpO2: 100% 98%   Physical Exam Vitals reviewed.  Constitutional:      Appearance: He is normal weight.  HENT:     Head: Normocephalic.     Nose: Nose normal.     Mouth/Throat:     Mouth: Mucous membranes are moist.     Pharynx: Oropharynx is clear.  Eyes:     Conjunctiva/sclera: Conjunctivae normal.     Pupils: Pupils are equal, round, and reactive to light.  Cardiovascular:     Rate and Rhythm: Normal rate.     Pulses: Normal pulses.     Heart sounds: Normal heart sounds.  Pulmonary:     Effort: Pulmonary effort is normal.     Breath sounds: Normal breath sounds.  Abdominal:     General: Abdomen is flat. Bowel sounds are normal. There is no distension.  Musculoskeletal:        General: Normal range of motion.  Skin:    General: Skin is warm.     Capillary Refill: Capillary refill takes less than 2 seconds.  Neurological:     General: No focal deficit present.     Mental Status: He is alert.  Psychiatric:        Mood and Affect: Mood normal.        Labs on Admission: I have personally reviewed the patients's labs and imaging studies.  Assessment/Plan Principal Problem:   Acute alcoholic pancreatitis Active Problems:   Pancreatitis   # acute on chronic pancreatitis # CGE most consistent with esophagitis -lipiase elevated with abd pain - CTAP 3/27: 1.6cm pseudocyst -worsening abd pain, nausea vomiting -lfts normal PLAN: Aggressive IVF PRN analgesia Twice daily PPI  #Bipolar- continue seroquel  #Alcohol abuse- continue CIWA  #HIV- continue biktarvy   Admission status: Observation Telemetry  Certification: The appropriate patient status for this patient is OBSERVATION. Observation status is judged to be reasonable and necessary in order to provide  the required intensity of service to ensure the patient's safety. The patient's presenting symptoms, physical exam findings, and initial radiographic and laboratory data in the context of their medical condition is felt to place them at decreased risk for further clinical deterioration. Furthermore, it is anticipated that the patient will be medically stable for discharge from the hospital within 2 midnights of admission.     Alan Mulder MD [facility] If 7PM-7AM, please contact night-coverage [email or web address]  02/13/2024, 4:45 PM

## 2024-02-14 DIAGNOSIS — F419 Anxiety disorder, unspecified: Secondary | ICD-10-CM | POA: Diagnosis present

## 2024-02-14 DIAGNOSIS — F17210 Nicotine dependence, cigarettes, uncomplicated: Secondary | ICD-10-CM | POA: Diagnosis not present

## 2024-02-14 DIAGNOSIS — Z21 Asymptomatic human immunodeficiency virus [HIV] infection status: Secondary | ICD-10-CM | POA: Diagnosis present

## 2024-02-14 DIAGNOSIS — K2091 Esophagitis, unspecified with bleeding: Secondary | ICD-10-CM | POA: Diagnosis present

## 2024-02-14 DIAGNOSIS — F10129 Alcohol abuse with intoxication, unspecified: Secondary | ICD-10-CM | POA: Diagnosis present

## 2024-02-14 DIAGNOSIS — G40909 Epilepsy, unspecified, not intractable, without status epilepticus: Secondary | ICD-10-CM | POA: Diagnosis present

## 2024-02-14 DIAGNOSIS — K860 Alcohol-induced chronic pancreatitis: Secondary | ICD-10-CM | POA: Diagnosis present

## 2024-02-14 DIAGNOSIS — F209 Schizophrenia, unspecified: Secondary | ICD-10-CM | POA: Diagnosis present

## 2024-02-14 DIAGNOSIS — F17200 Nicotine dependence, unspecified, uncomplicated: Secondary | ICD-10-CM | POA: Diagnosis present

## 2024-02-14 DIAGNOSIS — F3181 Bipolar II disorder: Secondary | ICD-10-CM | POA: Diagnosis present

## 2024-02-14 DIAGNOSIS — Z811 Family history of alcohol abuse and dependence: Secondary | ICD-10-CM | POA: Diagnosis not present

## 2024-02-14 DIAGNOSIS — R112 Nausea with vomiting, unspecified: Secondary | ICD-10-CM | POA: Diagnosis present

## 2024-02-14 DIAGNOSIS — G8929 Other chronic pain: Secondary | ICD-10-CM | POA: Diagnosis present

## 2024-02-14 DIAGNOSIS — F4310 Post-traumatic stress disorder, unspecified: Secondary | ICD-10-CM | POA: Diagnosis present

## 2024-02-14 DIAGNOSIS — K852 Alcohol induced acute pancreatitis without necrosis or infection: Secondary | ICD-10-CM | POA: Diagnosis present

## 2024-02-14 DIAGNOSIS — Y903 Blood alcohol level of 60-79 mg/100 ml: Secondary | ICD-10-CM | POA: Diagnosis present

## 2024-02-14 DIAGNOSIS — F10920 Alcohol use, unspecified with intoxication, uncomplicated: Secondary | ICD-10-CM | POA: Diagnosis not present

## 2024-02-14 DIAGNOSIS — Z800 Family history of malignant neoplasm of digestive organs: Secondary | ICD-10-CM | POA: Diagnosis not present

## 2024-02-14 DIAGNOSIS — Z79899 Other long term (current) drug therapy: Secondary | ICD-10-CM | POA: Diagnosis not present

## 2024-02-14 MED ORDER — HYDROMORPHONE HCL 1 MG/ML IJ SOLN
0.5000 mg | INTRAMUSCULAR | Status: AC | PRN
Start: 2024-02-14 — End: 2024-02-15
  Administered 2024-02-14 – 2024-02-15 (×3): 1 mg via INTRAVENOUS
  Filled 2024-02-14 (×3): qty 1

## 2024-02-14 MED ORDER — METOCLOPRAMIDE HCL 5 MG/ML IJ SOLN
5.0000 mg | Freq: Once | INTRAMUSCULAR | Status: AC
  Administered 2024-02-14: 5 mg via INTRAVENOUS
  Filled 2024-02-14: qty 2

## 2024-02-14 MED ORDER — OXYCODONE HCL 5 MG PO TABS
5.0000 mg | ORAL_TABLET | ORAL | Status: DC | PRN
  Administered 2024-02-14 (×2): 5 mg via ORAL
  Filled 2024-02-14 (×2): qty 1

## 2024-02-14 MED ORDER — SODIUM CHLORIDE 0.9% FLUSH: 10.0000 mL | INTRAVENOUS | Status: DC | PRN

## 2024-02-14 MED ORDER — ENOXAPARIN SODIUM 40 MG/0.4ML IJ SOSY
40.0000 mg | PREFILLED_SYRINGE | INTRAMUSCULAR | Status: DC
Start: 2024-02-14 — End: 2024-02-15
  Filled 2024-02-14: qty 0.4

## 2024-02-14 MED ORDER — LACTATED RINGERS IV SOLN: INTRAVENOUS | Status: DC

## 2024-02-14 NOTE — Plan of Care (Signed)
  Problem: Education: Goal: Knowledge of General Education information will improve Description: Including pain rating scale, medication(s)/side effects and non-pharmacologic comfort measures Outcome: Progressing   Problem: Health Behavior/Discharge Planning: Goal: Ability to manage health-related needs will improve Outcome: Progressing   Problem: Clinical Measurements: Goal: Will remain free from infection Outcome: Progressing   Problem: Activity: Goal: Risk for activity intolerance will decrease Outcome: Progressing   Problem: Nutrition: Goal: Adequate nutrition will be maintained Outcome: Progressing   Problem: Coping: Goal: Level of anxiety will decrease Outcome: Progressing   Problem: Pain Managment: Goal: General experience of comfort will improve and/or be controlled Outcome: Progressing   Problem: Safety: Goal: Ability to remain free from injury will improve Outcome: Progressing   Problem: Skin Integrity: Goal: Risk for impaired skin integrity will decrease Outcome: Progressing

## 2024-02-14 NOTE — Progress Notes (Signed)
 PROGRESS NOTE    Gary [name]  ZOX:[id number] DOB: February 18, 1986 DOA: 02/13/2024 PCP: Pcp, No    Brief Narrative:   Gary [name] is a 38 y.o. male with past medical history significant for frequent hospitalizations for acute on chronic pancreatitis in the setting of ongoing alcohol abuse, bipolar disorder, schizophrenia, HIV, prior history of seizure who presented to [facility] ED on 02/13/2024 from home via EMS with complaints of abdominal pain associated with nausea and vomiting over the last 1 day.  Last reported alcohol intake day prior to admission.  In the ED, temperature 98.6 F, HR 94, RR 16, BP 131/74, SpO2 100% on room air.  WBC 5.0, hemoglobin 13.6, platelet count 227.  Sodium 142, potassium 3.7, chloride 103, CO2 23, glucose 113, BUN 10, creatinine 0.52, AST 22, ALT 15, total bilirubin 0.8.  Lipase 279.  EtOH level 76.  Chest x-ray with no acute cardiopulmonary disease process.  Patient was given pain medication, IV fluids.  TRH consulted for admission for further evaluation management of acute on chronic pancreatitis.  Assessment & Plan:   Acute on chronic pancreatitis in the setting of continued alcohol abuse Patient presenting with recurrent abdominal pain in the setting of continued alcohol use disorder.  EtOH level elevated 76 on admission with lipase 279. -- Clear liquid diet, advance as tolerates -- LR at 125 mL/h -- Dilaudid 1 mg IV every 3 hours as needed severe pain -- Zofran as needed nausea/vomiting -- Lipase daily  Bipolar disorder/schizophrenia -- Seroquel 50 mg p.o. twice daily  HIV -- Continue Biktarvy -- Outpatient follow-up with infectious disease  Alcohol intoxication Alcohol use disorder Patient with continued alcohol abuse, last drink day prior to admission.  EtOH level elevated 76 on admission. -- CIWA protocol with symptom triggered Ativan -- Thiamine, folic acid, multivitamin   DVT prophylaxis: Lovenox    Code Status: Full  Code Family Communication: No family present at bedside  Disposition Plan:  Level of care: Telemetry Status is: Inpatient Remains inpatient appropriate because: Needs further advancement of diet with toleration    Consultants:  None  Procedures:  None  Antimicrobials:  None   Subjective: Patient seen examined bedside, lying in bed.  Continues with some mild nausea, epigastric abdominal pain.  No vomiting.  Counseled on need for complete cessation of alcohol given his recurrent pancreatitis.  Patient with no other specific complaints, concerns or questions at this time.  Denies headache, no vision changes, no chest pain, no palpitations, no shortness of breath, no fever/chills/night sweats, no vomiting/diarrhea, no focal weakness, no cough/congestion, no fatigue, no paresthesias.  No acute events overnight per nursing staff.  Objective: Vitals:   02/14/24 0611 02/14/24 0735 02/14/24 1007 02/14/24 1211  BP: 105/64 (!) 110/57 106/62 100/68  Pulse: 66 68 70 93  Resp: 20 16    Temp: 98.7 F (37.1 C)   98.7 F (37.1 C)  TempSrc: Oral     SpO2: 98% 100% 100% 100%    Intake/Output Summary (Last 24 hours) at 02/14/2024 1418 Last data filed at 02/14/2024 0456 Gross per 24 hour  Intake 1432.57 ml  Output 580 ml  Net 852.57 ml   There were no vitals filed for this visit.  Examination:  Physical Exam: GEN: NAD, alert and oriented x 3, chronically ill appearance, appears older than stated age HEENT: NCAT, PERRL, EOMI, sclera clear, MMM PULM: CTAB w/o wheezes/crackles, normal respiratory effort on room air CV: RRR w/o M/G/R GI: abd soft, NTND, NABS, no  R/G/M MSK: no peripheral edema, muscle strength globally intact 5/5 bilateral upper/lower extremities NEURO: CN II-XII intact, no focal deficits, sensation to light touch intact PSYCH: normal mood/affect Integumentary: No concerning rashes/lesions/wounds noted on exposed skin surfaces    Data Reviewed: I have personally reviewed  following labs and imaging studies  CBC: Recent Labs  Lab 02/13/24 1100  WBC 5.0  NEUTROABS 3.5  HGB 13.6  HCT 39.9  MCV 90.9  PLT 227   Basic Metabolic Panel: Recent Labs  Lab 02/13/24 1100  NA 142  K 3.7  CL 103  CO2 23  GLUCOSE 113*  BUN 10  CREATININE 0.52*  CALCIUM 9.4   GFR: Estimated Creatinine Clearance: 113.6 mL/min (A) (by C-G formula based on SCr of 0.52 mg/dL (L)). Liver Function Tests: Recent Labs  Lab 02/13/24 1100  AST 22  ALT 15  ALKPHOS 72  BILITOT 0.8  PROT 8.3*  ALBUMIN 4.4   Recent Labs  Lab 02/13/24 1100  LIPASE 279*   No results for input(s): "AMMONIA" in the last 168 hours. Coagulation Profile: Recent Labs  Lab 02/13/24 1100  INR 1.1   Cardiac Enzymes: No results for input(s): "CKTOTAL", "CKMB", "CKMBINDEX", "TROPONINI" in the last 168 hours. BNP (last 3 results) No results for input(s): "PROBNP" in the last 8760 hours. HbA1C: No results for input(s): "HGBA1C" in the last 72 hours. CBG: No results for input(s): "GLUCAP" in the last 168 hours. Lipid Profile: No results for input(s): "CHOL", "HDL", "LDLCALC", "TRIG", "CHOLHDL", "LDLDIRECT" in the last 72 hours. Thyroid Function Tests: No results for input(s): "TSH", "T4TOTAL", "FREET4", "T3FREE", "THYROIDAB" in the last 72 hours. Anemia Panel: No results for input(s): "VITAMINB12", "FOLATE", "FERRITIN", "TIBC", "IRON", "RETICCTPCT" in the last 72 hours. Sepsis Labs: No results for input(s): "PROCALCITON", "LATICACIDVEN" in the last 168 hours.  No results found for this or any previous visit (from the past 240 hours).       Radiology Studies: DG Chest Portable 1 View Result Date: 02/13/2024 CLINICAL DATA:  Chest pain. EXAM: PORTABLE CHEST 1 VIEW COMPARISON:  Chest radiograph dated 11/25/2023. FINDINGS: Similar rotation to the left. The heart size and mediastinal contours are unchanged. Similar mild elevation of the left hemidiaphragm. No focal consolidation, pleural  effusion, or pneumothorax. No acute osseous abnormality. IMPRESSION: No acute cardiopulmonary findings. Electronically Signed   By: Hart Robinsons M.D.   On: 02/13/2024 10:38        Scheduled Meds:  bictegravir-emtricitabine-tenofovir AF  1 tablet Oral Daily   folic acid  1 mg Oral Daily   multivitamin with minerals  1 tablet Oral Daily   pantoprazole (PROTONIX) IV  40 mg Intravenous Q12H   QUEtiapine  50 mg Oral BID   thiamine  100 mg Oral Daily   Or   thiamine  100 mg Intravenous Daily   Continuous Infusions:  lactated ringers       LOS: 1 day    Time spent: 50 minutes spent on 02/14/2024 caring for this patient face-to-face including chart review, ordering labs/tests, documenting, discussion with nursing staff, consultants, updating family and interview/physical exam    Gary Philips [place], DO [facility] Available via Epic secure chat 7am-7pm After these hours, please refer to coverage provider listed on [email or web address] 02/14/2024, 2:18 PM

## 2024-02-14 NOTE — Progress Notes (Signed)
   02/14/24 1747  Provider Notification  Provider Name/Title [place] Eric  Date Provider Notified 02/14/24  Time Provider Notified 1630  Method of Notification Page  Notification Reason Red med refusal  Provider response No new orders  Date of Provider Response 02/14/24  Time of Provider Response [phone]

## 2024-02-14 NOTE — Progress Notes (Signed)
 Pharmacy: [facility] for VTE pxx  Pharmacy has been consulted to dose lovenox for VTE pxx.  - labs on 4/6: scr <1, cbc ok - weight 63kg   Plan: - Lovenox 40 mg SQ daily - Pharmacy will sign off for abx consult.  Reconsult [place] if need further assistance.   Dorna Leitz, PharmD, BCPS 02/14/2024 2:25 PM

## 2024-02-14 NOTE — Progress Notes (Addendum)
 Patient admitted with recent discharge on 3/26, 2/5, 1/26, 1/22, 1/14, 1/7 and 1/1.  Patient currently homeless. Resources placed on AVS. TOC signing off at this time.    02/14/24 1510  TOC Brief Assessment  Insurance and Status Reviewed  Patient has primary care physician No  Home environment has been reviewed Houseless- staying on the streets  Prior level of function: Independent  Prior/Current Home Services No current home services  Social Drivers of Health Review SDOH reviewed interventions complete  Readmission risk has been reviewed Yes  Transition of care needs no transition of care needs at this time

## 2024-02-14 NOTE — Plan of Care (Signed)
 ?  Problem: Clinical Measurements: ?Goal: Ability to maintain clinical measurements within normal limits will improve ?Outcome: Progressing ?Goal: Will remain free from infection ?Outcome: Progressing ?Goal: Diagnostic test results will improve ?Outcome: Progressing ?  ?

## 2024-02-15 DIAGNOSIS — K852 Alcohol induced acute pancreatitis without necrosis or infection: Secondary | ICD-10-CM | POA: Diagnosis not present

## 2024-02-15 LAB — COMPREHENSIVE METABOLIC PANEL WITH GFR
ALT: 11 U/L (ref 0–44)
AST: 14 U/L — ABNORMAL LOW (ref 15–41)
Albumin: 3.4 g/dL — ABNORMAL LOW (ref 3.5–5.0)
Alkaline Phosphatase: 64 U/L (ref 38–126)
Anion gap: 5 (ref 5–15)
BUN: <5 mg/dL — ABNORMAL LOW (ref 6–20)
CO2: 27 mmol/L (ref 22–32)
Calcium: 9.1 mg/dL (ref 8.9–10.3)
Chloride: 104 mmol/L (ref 98–111)
Creatinine, Ser: 0.58 mg/dL — ABNORMAL LOW (ref 0.61–1.24)
GFR, Estimated: >60 mL/min (ref >60)
Glucose, Bld: 94 mg/dL (ref 70–99)
Potassium: 3.7 mmol/L (ref 3.5–5.1)
Sodium: 136 mmol/L (ref 135–145)
Total Bilirubin: 0.7 mg/dL (ref 0.0–1.2)
Total Protein: 6.4 g/dL — ABNORMAL LOW (ref 6.5–8.1)

## 2024-02-15 LAB — CBC
HCT: 31.4 % — ABNORMAL LOW (ref 39.0–52.0)
Hemoglobin: 10.3 g/dL — ABNORMAL LOW (ref 13.0–17.0)
MCH: 30.9 pg (ref 26.0–34.0)
MCHC: 32.8 g/dL (ref 30.0–36.0)
MCV: 94.3 fL (ref 80.0–100.0)
Platelets: 129 10*3/uL — ABNORMAL LOW (ref 150–400)
RBC: 3.33 MIL/uL — ABNORMAL LOW (ref 4.22–5.81)
RDW: 14.1 % (ref 11.5–15.5)
WBC: 3 10*3/uL — ABNORMAL LOW (ref 4.0–10.5)
nRBC: 0 % (ref 0.0–0.2)

## 2024-02-15 LAB — PHOSPHORUS: Phosphorus: 3.5 mg/dL (ref 2.5–4.6)

## 2024-02-15 LAB — LIPASE, BLOOD: Lipase: 211 U/L — ABNORMAL HIGH (ref 11–51)

## 2024-02-15 LAB — MAGNESIUM: Magnesium: 1.8 mg/dL (ref 1.7–2.4)

## 2024-02-15 MED ORDER — LACTATED RINGERS IV SOLN: INTRAVENOUS | Status: DC

## 2024-02-15 MED ORDER — BOOST / RESOURCE BREEZE PO LIQD CUSTOM
1.0000 | Freq: Three times a day (TID) | ORAL | Status: DC
Start: 2024-02-15 — End: 2024-02-17
  Administered 2024-02-15: 1 via ORAL
  Administered 2024-02-15 (×2): 237 mL via ORAL
  Administered 2024-02-16 – 2024-02-17 (×4): 1 via ORAL

## 2024-02-15 MED ORDER — HYDROMORPHONE HCL 1 MG/ML IJ SOLN
0.5000 mg | INTRAMUSCULAR | Status: DC | PRN
Start: 2024-02-15 — End: 2024-02-16
  Administered 2024-02-15 – 2024-02-16 (×6): 1 mg via INTRAVENOUS
  Filled 2024-02-15 (×6): qty 1

## 2024-02-15 MED ORDER — NICOTINE 14 MG/24HR TD PT24
14.0000 mg | MEDICATED_PATCH | Freq: Every day | TRANSDERMAL | Status: AC
  Administered 2024-02-15 – 2024-02-16 (×2): 14 mg via TRANSDERMAL
  Filled 2024-02-15 (×2): qty 1

## 2024-02-15 NOTE — Progress Notes (Signed)
 Pt declined telemetry. Pt keeps pulling leads off, saying he's a grown man that doesn't want it on so he's not going to keep it on. Night shift floor coverage was made aware.

## 2024-02-15 NOTE — Plan of Care (Signed)

## 2024-02-15 NOTE — Progress Notes (Signed)
 PROGRESS NOTE    Gary [name]  JXB:[id number] DOB: 1985/11/17 DOA: 02/13/2024 PCP: Pcp, No    Brief Narrative:   Gary [name] is a 38 y.o. male with past medical history significant for frequent hospitalizations for acute on chronic pancreatitis in the setting of ongoing alcohol abuse, bipolar disorder, schizophrenia, HIV, prior history of seizure who presented to [facility] ED on 02/13/2024 from home via EMS with complaints of abdominal pain associated with nausea and vomiting over the last 1 day.  Last reported alcohol intake day prior to admission.  In the ED, temperature 98.6 F, HR 94, RR 16, BP 131/74, SpO2 100% on room air.  WBC 5.0, hemoglobin 13.6, platelet count 227.  Sodium 142, potassium 3.7, chloride 103, CO2 23, glucose 113, BUN 10, creatinine 0.52, AST 22, ALT 15, total bilirubin 0.8.  Lipase 279.  EtOH level 76.  Chest x-ray with no acute cardiopulmonary disease process.  Patient was given pain medication, IV fluids.  TRH consulted for admission for further evaluation management of acute on chronic pancreatitis.  Assessment & Plan:   Acute on chronic pancreatitis in the setting of continued alcohol abuse Patient presenting with recurrent abdominal pain in the setting of continued alcohol use disorder.  EtOH level elevated 76 on admission with lipase 279. -- Advance to full liquid diet today, further advancement as tolerates -- LR at 125 mL/h -- Dilaudid 1 mg IV every 3 hours as needed severe pain -- Zofran as needed nausea/vomiting -- Lipase daily  Bipolar disorder/schizophrenia -- Seroquel 50 mg p.o. twice daily  HIV -- Continue Biktarvy -- Outpatient follow-up with infectious disease  Alcohol intoxication Alcohol use disorder Patient with continued alcohol abuse, last drink day prior to admission.  EtOH level elevated 76 on admission. -- CIWA protocol with symptom triggered Ativan -- Thiamine, folic acid, multivitamin   DVT prophylaxis: Refusing  Lovenox    Code Status: Full Code Family Communication: No family present at bedside  Disposition Plan:  Level of care: Med-Surg Status is: Inpatient Remains inpatient appropriate because: Needs further advancement of diet with toleration    Consultants:  None  Procedures:  None  Antimicrobials:  None   Subjective: Patient seen examined bedside, lying in bed.  Abdominal pain improved.  Wishes to further advance to full liquids today.  Refusing to wear telemetry or Lovenox injections. No other specific complaints, concerns or questions at this time.  Denies headache, no vision changes, no chest pain, no palpitations, no shortness of breath, no fever/chills/night sweats, no vomiting/diarrhea, no focal weakness, no cough/congestion, no fatigue, no paresthesias.  No acute events overnight per nursing staff.  Objective: Vitals:   02/14/24 2005 02/15/24 0445 02/15/24 0948 02/15/24 1157  BP: 107/73 103/83 115/85 115/85  Pulse: 81 (!) 59 72 72  Resp: 18 18    Temp: 98.4 F (36.9 C) 97.6 F (36.4 C)    TempSrc:      SpO2: 99% 100%  100%  Weight:      Height:        Intake/Output Summary (Last 24 hours) at 02/15/2024 1245 Last data filed at 02/15/2024 1230 Gross per 24 hour  Intake 1440.57 ml  Output 751 ml  Net 689.57 ml   Filed Weights   02/14/24 1419  Weight: 63.5 kg    Examination:  Physical Exam: GEN: NAD, alert and oriented x 3, chronically ill appearance, appears older than stated age HEENT: NCAT, PERRL, EOMI, sclera clear, MMM PULM: CTAB w/o wheezes/crackles, normal respiratory  effort on room air CV: RRR w/o M/G/R GI: abd soft, NTND, NABS, no R/G/M MSK: no peripheral edema, muscle strength globally intact 5/5 bilateral upper/lower extremities NEURO: CN II-XII intact, no focal deficits, sensation to light touch intact PSYCH: normal mood/affect Integumentary: No concerning rashes/lesions/wounds noted on exposed skin surfaces    Data Reviewed: I have  personally reviewed following labs and imaging studies  CBC: Recent Labs  Lab 02/13/24 1100 02/15/24 0407  WBC 5.0 3.0*  NEUTROABS 3.5  --   HGB 13.6 10.3*  HCT 39.9 31.4*  MCV 90.9 94.3  PLT 227 129*   Basic Metabolic Panel: Recent Labs  Lab 02/13/24 1100 02/15/24 0407  NA 142 136  K 3.7 3.7  CL 103 104  CO2 23 27  GLUCOSE 113* 94  BUN 10 <5*  CREATININE 0.52* 0.58*  CALCIUM 9.4 9.1  MG  --  1.8  PHOS  --  3.5   GFR: Estimated Creatinine Clearance: 113.6 mL/min (A) (by C-G formula based on SCr of 0.58 mg/dL (L)). Liver Function Tests: Recent Labs  Lab 02/13/24 1100 02/15/24 0407  AST 22 14*  ALT 15 11  ALKPHOS 72 64  BILITOT 0.8 0.7  PROT 8.3* 6.4*  ALBUMIN 4.4 3.4*   Recent Labs  Lab 02/13/24 1100 02/15/24 0407  LIPASE 279* 211*   No results for input(s): "AMMONIA" in the last 168 hours. Coagulation Profile: Recent Labs  Lab 02/13/24 1100  INR 1.1   Cardiac Enzymes: No results for input(s): "CKTOTAL", "CKMB", "CKMBINDEX", "TROPONINI" in the last 168 hours. BNP (last 3 results) No results for input(s): "PROBNP" in the last 8760 hours. HbA1C: No results for input(s): "HGBA1C" in the last 72 hours. CBG: No results for input(s): "GLUCAP" in the last 168 hours. Lipid Profile: No results for input(s): "CHOL", "HDL", "LDLCALC", "TRIG", "CHOLHDL", "LDLDIRECT" in the last 72 hours. Thyroid Function Tests: No results for input(s): "TSH", "T4TOTAL", "FREET4", "T3FREE", "THYROIDAB" in the last 72 hours. Anemia Panel: No results for input(s): "VITAMINB12", "FOLATE", "FERRITIN", "TIBC", "IRON", "RETICCTPCT" in the last 72 hours. Sepsis Labs: No results for input(s): "PROCALCITON", "LATICACIDVEN" in the last 168 hours.  No results found for this or any previous visit (from the past 240 hours).       Radiology Studies: No results found.       Scheduled Meds:  bictegravir-emtricitabine-tenofovir AF  1 tablet Oral Daily   feeding supplement  1  Container Oral TID BM   folic acid  1 mg Oral Daily   multivitamin with minerals  1 tablet Oral Daily   nicotine  14 mg Transdermal Daily   pantoprazole (PROTONIX) IV  40 mg Intravenous Q12H   QUEtiapine  50 mg Oral BID   thiamine  100 mg Oral Daily   Or   thiamine  100 mg Intravenous Daily   Continuous Infusions:  lactated ringers 125 mL/hr at 02/15/24 0950     LOS: 2 days    Time spent: 46 minutes spent on 02/15/2024 caring for this patient face-to-face including chart review, ordering labs/tests, documenting, discussion with nursing staff, consultants, updating family and interview/physical exam    Alvira Philips [place], DO [facility] Available via Epic secure chat 7am-7pm After these hours, please refer to coverage provider listed on [email or web address] 02/15/2024, 12:45 PM

## 2024-02-15 NOTE — Plan of Care (Signed)
   Problem: Activity: Goal: Risk for activity intolerance will decrease Outcome: Progressing   Problem: Nutrition: Goal: Adequate nutrition will be maintained Outcome: Progressing   Problem: Coping: Goal: Level of anxiety will decrease Outcome: Progressing   Problem: Safety: Goal: Ability to remain free from injury will improve Outcome: Progressing   Problem: Skin Integrity: Goal: Risk for impaired skin integrity will decrease Outcome: Progressing

## 2024-02-16 DIAGNOSIS — K852 Alcohol induced acute pancreatitis without necrosis or infection: Secondary | ICD-10-CM | POA: Diagnosis not present

## 2024-02-16 MED ORDER — HYDROMORPHONE HCL 1 MG/ML IJ SOLN
0.5000 mg | INTRAMUSCULAR | Status: DC | PRN
  Administered 2024-02-16 – 2024-02-17 (×5): 0.5 mg via INTRAVENOUS
  Filled 2024-02-16 (×5): qty 0.5

## 2024-02-16 MED ORDER — OXYCODONE HCL 5 MG PO TABS
5.0000 mg | ORAL_TABLET | ORAL | Status: DC | PRN
  Administered 2024-02-16: 10 mg via ORAL
  Filled 2024-02-16: qty 2

## 2024-02-16 NOTE — Plan of Care (Signed)

## 2024-02-16 NOTE — Progress Notes (Signed)
 PROGRESS NOTE    Gary [name]  JYN:[id number] DOB: 1985/12/02 DOA: 02/13/2024 PCP: Pcp, No    Brief Narrative:   Gary [name] is a 38 y.o. male with past medical history significant for frequent hospitalizations for acute on chronic pancreatitis in the setting of ongoing alcohol abuse, bipolar disorder, schizophrenia, HIV, prior history of seizure who presented to [facility] ED on 02/13/2024 from home via EMS with complaints of abdominal pain associated with nausea and vomiting over the last 1 day.  Last reported alcohol intake day prior to admission.  In the ED, temperature 98.6 F, HR 94, RR 16, BP 131/74, SpO2 100% on room air.  WBC 5.0, hemoglobin 13.6, platelet count 227.  Sodium 142, potassium 3.7, chloride 103, CO2 23, glucose 113, BUN 10, creatinine 0.52, AST 22, ALT 15, total bilirubin 0.8.  Lipase 279.  EtOH level 76.  Chest x-ray with no acute cardiopulmonary disease process.  Patient was given pain medication, IV fluids.  TRH consulted for admission for further evaluation management of acute on chronic pancreatitis.  Assessment & Plan:   Acute on chronic pancreatitis in the setting of continued alcohol abuse Patient presenting with recurrent abdominal pain in the setting of continued alcohol use disorder.  EtOH level elevated 76 on admission with lipase 279. -- Advance to soft diet today -- Oxycodone 5-10 mg p.o. q4h PRN moderate/severe pain -- Zofran as needed nausea/vomiting -- Lipase daily  Bipolar disorder/schizophrenia -- Seroquel 50 mg p.o. twice daily  HIV -- Continue Biktarvy -- Outpatient follow-up with infectious disease  Alcohol intoxication Alcohol use disorder Patient with continued alcohol abuse, last drink day prior to admission.  EtOH level elevated 76 on admission. -- CIWA protocol with symptom triggered Ativan -- Thiamine, folic acid, multivitamin  Tobacco use disorder Counseled on need for complete cessation. --Nicotine patch   DVT  prophylaxis: Refusing Lovenox    Code Status: Full Code Family Communication: No family present at bedside  Disposition Plan:  Level of care: Med-Surg Status is: Inpatient Remains inpatient appropriate because: Needs further advancement of diet with toleration    Consultants:  None  Procedures:  None  Antimicrobials:  None   Subjective: Patient seen examined bedside, lying in bed.  Abdominal pain improved.  Wishes to further advance to soft diet today.  Refusing to wear telemetry or receive Lovenox injections.  Now advancing diet, will discontinue Dilaudid.  No other specific complaints, concerns or questions at this time.  Denies headache, no vision changes, no chest pain, no palpitations, no shortness of breath, no fever/chills/night sweats, no vomiting/diarrhea, no focal weakness, no cough/congestion, no fatigue, no paresthesias.  No acute events overnight per nursing staff.  Objective: Vitals:   02/15/24 1557 02/15/24 1749 02/15/24 1920 02/16/24 0441  BP: 122/79 122/79 107/76 113/73  Pulse: 74 74 84 75  Resp:   15 15  Temp:   98.5 F (36.9 C) 98.2 F (36.8 C)  TempSrc:      SpO2:   98% 98%  Weight:      Height:        Intake/Output Summary (Last 24 hours) at 02/16/2024 1139 Last data filed at 02/16/2024 0900 Gross per 24 hour  Intake 1651.58 ml  Output 2050 ml  Net -398.42 ml   Filed Weights   02/14/24 1419  Weight: 63.5 kg    Examination:  Physical Exam: GEN: NAD, alert and oriented x 3, chronically ill appearance, appears older than stated age HEENT: NCAT, PERRL, EOMI, sclera clear,  MMM PULM: CTAB w/o wheezes/crackles, normal respiratory effort on room air CV: RRR w/o M/G/R GI: abd soft, NTND, NABS, no R/G/M MSK: no peripheral edema, muscle strength globally intact 5/5 bilateral upper/lower extremities NEURO: CN II-XII intact, no focal deficits, sensation to light touch intact PSYCH: normal mood/affect Integumentary: No concerning rashes/lesions/wounds  noted on exposed skin surfaces    Data Reviewed: I have personally reviewed following labs and imaging studies  CBC: Recent Labs  Lab 02/13/24 1100 02/15/24 0407  WBC 5.0 3.0*  NEUTROABS 3.5  --   HGB 13.6 10.3*  HCT 39.9 31.4*  MCV 90.9 94.3  PLT 227 129*   Basic Metabolic Panel: Recent Labs  Lab 02/13/24 1100 02/15/24 0407  NA 142 136  K 3.7 3.7  CL 103 104  CO2 23 27  GLUCOSE 113* 94  BUN 10 <5*  CREATININE 0.52* 0.58*  CALCIUM 9.4 9.1  MG  --  1.8  PHOS  --  3.5   GFR: Estimated Creatinine Clearance: 113.6 mL/min (A) (by C-G formula based on SCr of 0.58 mg/dL (L)). Liver Function Tests: Recent Labs  Lab 02/13/24 1100 02/15/24 0407  AST 22 14*  ALT 15 11  ALKPHOS 72 64  BILITOT 0.8 0.7  PROT 8.3* 6.4*  ALBUMIN 4.4 3.4*   Recent Labs  Lab 02/13/24 1100 02/15/24 0407  LIPASE 279* 211*   No results for input(s): "AMMONIA" in the last 168 hours. Coagulation Profile: Recent Labs  Lab 02/13/24 1100  INR 1.1   Cardiac Enzymes: No results for input(s): "CKTOTAL", "CKMB", "CKMBINDEX", "TROPONINI" in the last 168 hours. BNP (last 3 results) No results for input(s): "PROBNP" in the last 8760 hours. HbA1C: No results for input(s): "HGBA1C" in the last 72 hours. CBG: No results for input(s): "GLUCAP" in the last 168 hours. Lipid Profile: No results for input(s): "CHOL", "HDL", "LDLCALC", "TRIG", "CHOLHDL", "LDLDIRECT" in the last 72 hours. Thyroid Function Tests: No results for input(s): "TSH", "T4TOTAL", "FREET4", "T3FREE", "THYROIDAB" in the last 72 hours. Anemia Panel: No results for input(s): "VITAMINB12", "FOLATE", "FERRITIN", "TIBC", "IRON", "RETICCTPCT" in the last 72 hours. Sepsis Labs: No results for input(s): "PROCALCITON", "LATICACIDVEN" in the last 168 hours.  No results found for this or any previous visit (from the past 240 hours).       Radiology Studies: No results found.       Scheduled Meds:   bictegravir-emtricitabine-tenofovir AF  1 tablet Oral Daily   feeding supplement  1 Container Oral TID BM   folic acid  1 mg Oral Daily   multivitamin with minerals  1 tablet Oral Daily   nicotine  14 mg Transdermal Daily   pantoprazole (PROTONIX) IV  40 mg Intravenous Q12H   QUEtiapine  50 mg Oral BID   thiamine  100 mg Oral Daily   Or   thiamine  100 mg Intravenous Daily   Continuous Infusions:     LOS: 3 days    Time spent: 46 minutes spent on 02/16/2024 caring for this patient face-to-face including chart review, ordering labs/tests, documenting, discussion with nursing staff, consultants, updating family and interview/physical exam    Alvira Philips [place], DO [facility] Available via Epic secure chat 7am-7pm After these hours, please refer to coverage provider listed on [email or web address] 02/16/2024, 11:39 AM

## 2024-02-17 DIAGNOSIS — F17210 Nicotine dependence, cigarettes, uncomplicated: Secondary | ICD-10-CM

## 2024-02-17 DIAGNOSIS — F17200 Nicotine dependence, unspecified, uncomplicated: Secondary | ICD-10-CM | POA: Diagnosis not present

## 2024-02-17 DIAGNOSIS — F10920 Alcohol use, unspecified with intoxication, uncomplicated: Secondary | ICD-10-CM

## 2024-02-17 DIAGNOSIS — K852 Alcohol induced acute pancreatitis without necrosis or infection: Secondary | ICD-10-CM | POA: Diagnosis not present

## 2024-02-17 LAB — BASIC METABOLIC PANEL WITH GFR
Anion gap: 8 (ref 5–15)
BUN: 9 mg/dL (ref 6–20)
CO2: 22 mmol/L (ref 22–32)
Calcium: 8.4 mg/dL — ABNORMAL LOW (ref 8.9–10.3)
Chloride: 104 mmol/L (ref 98–111)
Creatinine, Ser: 0.65 mg/dL (ref 0.61–1.24)
GFR, Estimated: >60 mL/min (ref >60)
Glucose, Bld: 110 mg/dL — ABNORMAL HIGH (ref 70–99)
Potassium: 3.9 mmol/L (ref 3.5–5.1)
Sodium: 134 mmol/L — ABNORMAL LOW (ref 135–145)

## 2024-02-17 LAB — LIPASE, BLOOD: Lipase: 62 U/L — ABNORMAL HIGH (ref 11–51)

## 2024-02-17 LAB — MAGNESIUM: Magnesium: 1.8 mg/dL (ref 1.7–2.4)

## 2024-02-17 NOTE — Plan of Care (Signed)

## 2024-02-17 NOTE — TOC Transition Note (Signed)
 Transition of Care [facility]) - Discharge Note   Patient Details  Name: Gary [name] MRN: [id number] Date of Birth: August 18, 1986  Transition of Care [facility]) CM/SW Contact:  Otelia Santee, LCSW Phone Number: 02/17/2024, 10:27 AM   Clinical Narrative:    Bus pass provided to RN to provide to pt at discharge.          Patient Goals and CMS Choice            Discharge Placement                       Discharge Plan and Services Additional resources added to the After Visit Summary for                                       Social Drivers of Health (SDOH) Interventions SDOH Screenings   Food Insecurity: No Food Insecurity (02/13/2024)  Recent Concern: Food Insecurity - Food Insecurity Present (02/03/2024)  Housing: High Risk (02/13/2024)  Transportation Needs: Unmet Transportation Needs (02/13/2024)  Utilities: Not At Risk (02/13/2024)  Alcohol Screen: High Risk (07/25/2021)  Depression (PHQ2-9): Medium Risk (01/28/2024)  Financial Resource Strain: Patient Declined (10/11/2023)  Social Connections: Socially Isolated (02/03/2024)  Stress: No Stress Concern Present (06/30/2022)   Received from [facility]  Recent Concern: Stress - Stress Concern Present (06/25/2022)   Received from [facility]  Tobacco Use: High Risk (02/13/2024)     Readmission Risk Interventions    02/14/2024    3:10 PM 12/15/2023   11:23 AM 11/29/2023   10:27 AM  Readmission Risk Prevention Plan  Transportation Screening Complete Complete Complete  Medication Review Oceanographer) Complete Complete Complete  PCP or Specialist appointment within 3-5 days of discharge Patient refused Complete   HRI or Home Care Consult Complete Complete Complete  SW Recovery Care/Counseling Consult Complete Complete Complete  Palliative Care Screening Not Applicable Not Applicable Not Applicable  Skilled Nursing Facility Not Applicable Not Applicable Not Applicable

## 2024-02-17 NOTE — Plan of Care (Signed)
  Problem: Education: Goal: Knowledge of General Education information will improve Description: Including pain rating scale, medication(s)/side effects and non-pharmacologic comfort measures Outcome: Progressing   Problem: Health Behavior/Discharge Planning: Goal: Ability to manage health-related needs will improve Outcome: Progressing   Problem: Clinical Measurements: Goal: Will remain free from infection Outcome: Progressing   Problem: Nutrition: Goal: Adequate nutrition will be maintained Outcome: Progressing   Problem: Coping: Goal: Level of anxiety will decrease Outcome: Progressing   Problem: Elimination: Goal: Will not experience complications related to bowel motility Outcome: Progressing Goal: Will not experience complications related to urinary retention Outcome: Progressing   Problem: Pain Managment: Goal: General experience of comfort will improve and/or be controlled Outcome: Progressing   Problem: Safety: Goal: Ability to remain free from injury will improve Outcome: Progressing

## 2024-02-17 NOTE — Discharge Summary (Signed)
 Physician Discharge Summary  Gary [name] QIO:[id number] DOB: 1986/08/07 DOA: 02/13/2024  PCP: Pcp, No  Admit date: 02/13/2024 Discharge date: 02/17/2024  Admitted From: Home Disposition: Home  Recommendations for Outpatient Follow-up:  Follow up with PCP in 1-2 weeks Outpatient follow-up with GI as needed Continue encourage complete alcohol abstinence/cessation as this is the leading etiology to his recurrent hospitalizations, has had 7 inpatient hospitalizations since January 2025 with 22 ED visits in the same timeframe; high risk for recurrent hospital presentations/hospitalizations due to his alcohol abuse  Home Health: No Equipment/Devices: None  Discharge Condition: Stable CODE STATUS: Full code Diet recommendation: Nonfat/bland diet  History of present illness:  Gary [name] is a 38 y.o. male with past medical history significant for frequent hospitalizations for acute on chronic pancreatitis in the setting of ongoing alcohol abuse, bipolar disorder, schizophrenia, HIV, prior history of seizure who presented to [facility] ED on 02/13/2024 from home via EMS with complaints of abdominal pain associated with nausea and vomiting over the last 1 day.  Last reported alcohol intake day prior to admission.   In the ED, temperature 98.6 F, HR 94, RR 16, BP 131/74, SpO2 100% on room air.  WBC 5.0, hemoglobin 13.6, platelet count 227.  Sodium 142, potassium 3.7, chloride 103, CO2 23, glucose 113, BUN 10, creatinine 0.52, AST 22, ALT 15, total bilirubin 0.8.  Lipase 279.  EtOH level 76.  Chest x-ray with no acute cardiopulmonary disease process.  Patient was given pain medication, IV fluids.  TRH consulted for admission for further evaluation management of acute on chronic pancreatitis.  Hospital course:  Acute on chronic pancreatitis in the setting of continued alcohol abuse Patient presenting with recurrent abdominal pain in the setting of continued alcohol use disorder.  EtOH  level elevated 76 on admission with lipase 279.  Patient was initially kept n.p.o. with aggressive IV fluid resuscitation and diet was slowly advanced with toleration.  Lipase improved to 62 at time of discharge.  Multiple discussions with patient regarding need for complete abstinence/cessation of alcohol given his recurrent pancreatitis events; as this is the etiology to his multiple hospitalizations and ED visits.   Bipolar disorder/schizophrenia Seroquel 50 mg p.o. twice daily   HIV Continue Biktarvy. Outpatient follow-up with infectious disease   Alcohol intoxication Alcohol use disorder Patient with continued alcohol abuse, last drink day prior to admission.  EtOH level elevated 76 on admission.  Monitor on CIWA protocol with no significant withdrawal symptoms noted during hospitalization.  Counseled on complete alcohol cessation/abstinence.   Tobacco use disorder Counseled on need for complete cessation.   Discharge Diagnoses:  Principal Problem:   Acute alcoholic pancreatitis Active Problems:   Pancreatitis   Alcohol induced acute pancreatitis    Discharge Instructions  Discharge Instructions     Call MD for:  difficulty breathing, headache or visual disturbances   Complete by: As directed    Call MD for:  extreme fatigue   Complete by: As directed    Call MD for:  persistant dizziness or light-headedness   Complete by: As directed    Call MD for:  persistant nausea and vomiting   Complete by: As directed    Call MD for:  severe uncontrolled pain   Complete by: As directed    Call MD for:  temperature >100.4   Complete by: As directed    Diet - low sodium heart healthy   Complete by: As directed    Increase activity slowly   Complete  by: As directed       Allergies as of 02/17/2024       Reactions   Tegretol [carbamazepine] Other (See Comments)   Vertigo  Paralysis   Caffeine Palpitations        Medication List     TAKE these medications     Biktarvy 50-200-25 MG Tabs tablet Generic drug: bictegravir-emtricitabine-tenofovir AF Take 1 tablet by mouth daily.   QUEtiapine 50 MG tablet Commonly known as: SEROQUEL Take 1 tablet (50 mg total) by mouth 2 (two) times daily. What changed:  how much to take when to take this additional instructions        Follow-up Information     [facility] Of [place]. [facility]. Schedule an appointment as soon as possible for a visit.   Specialty: Infectious Diseases Contact information: [address] [place], [address] [place] [address] [phone]               Allergies  Allergen Reactions   Tegretol [Carbamazepine] Other (See Comments)    Vertigo  Paralysis   Caffeine Palpitations    Consultations: None   Procedures/Studies: DG Chest Portable 1 View Result Date: 02/13/2024 CLINICAL DATA:  Chest pain. EXAM: PORTABLE CHEST 1 VIEW COMPARISON:  Chest radiograph dated 11/25/2023. FINDINGS: Similar rotation to the left. The heart size and mediastinal contours are unchanged. Similar mild elevation of the left hemidiaphragm. No focal consolidation, pleural effusion, or pneumothorax. No acute osseous abnormality. IMPRESSION: No acute cardiopulmonary findings. Electronically Signed   By: Hart Robinsons M.D.   On: 02/13/2024 10:38   US LIVER DOPPLER Result Date: 02/04/2024 CLINICAL DATA:  Portal vein thrombosis EXAM: DUPLEX ULTRASOUND OF LIVER TECHNIQUE: Color and duplex Doppler ultrasound was performed to evaluate the hepatic in-flow and out-flow vessels. COMPARISON:  CT from previous day FINDINGS: Liver: Normal parenchymal echogenicity. Normal hepatic contour without nodularity. No focal lesion, mass or intrahepatic biliary ductal dilatation. Main Portal Vein size: 2.3 cm Superior mesenteric vein not visualized. Portal Vein Velocities (all hepatopetal): Main Prox:  86 cm/sec Main Mid: 92 cm/sec Main Dist:  89 cm/sec Right:  25 cm/sec Left: 29 cm/sec Hepatic Vein Velocities (all hepatofugal): Right:  13 cm/sec Middle:  15 cm/sec Left:  22 cm/sec IVC: Present and patent with normal respiratory phasicity. Velocity 16 cm/sec. Hepatic Artery Velocity:  Not identified Splenic Vein Velocity:  23 cm/sec Spleen: 13.5 cm x 13.5 cm x 4.4 cm with a total volume of 420 cm^3 (411 cm^3 is upper limit normal) Portal Vein Occlusion/Thrombus: No Splenic Vein Occlusion/Thrombus: No Ascites: Trace Varices: None identified IMPRESSION: 1. Patent portal vein with normal directional flow. 2. No hepatic vein thrombosis. 3. Trace ascites. 4. Mild splenomegaly. Electronically Signed   By: Corlis Leak M.D.   On: 02/04/2024 07:47   CT HEAD WO CONTRAST ( ) Result Date: 02/03/2024 CLINICAL DATA:  Altered mental status EXAM: CT HEAD WITHOUT CONTRAST TECHNIQUE: Contiguous axial images were obtained from the base of the skull through the vertex without intravenous contrast. RADIATION DOSE REDUCTION: This exam was performed according to the departmental dose-optimization program which includes automated exposure control, adjustment of the mA and/or kV according to patient size and/or use of iterative reconstruction technique. COMPARISON:  09/16/2023 FINDINGS: Brain: No evidence of acute infarction, hemorrhage, hydrocephalus, extra-axial collection or mass lesion/mass effect. Vascular: No hyperdense vessel or unexpected calcification. Skull: Normal. Negative for fracture or focal lesion. Sinuses/Orbits: No acute finding. Other: None. IMPRESSION: No  acute intracranial abnormality noted. Electronically Signed   By: Alcide Clever M.D.   On: 02/03/2024 02:52   CT ABDOMEN PELVIS W CONTRAST Result Date: 02/03/2024 CLINICAL DATA:  Pancreatitis, acute, severe EXAM: CT ABDOMEN AND PELVIS WITH CONTRAST TECHNIQUE: Multidetector CT imaging of the abdomen and pelvis was performed using the standard protocol following bolus administration of intravenous contrast. RADIATION DOSE  REDUCTION: This exam was performed according to the departmental dose-optimization program which includes automated exposure control, adjustment of the mA and/or kV according to patient size and/or use of iterative reconstruction technique. CONTRAST:  75mL OMNIPAQUE IOHEXOL 350 MG/ML SOLN COMPARISON:  CT abdomen pelvis 12/17/2023, CT abdomen pelvis 11/25/2023, CT abdomen pelvis 10/28/2023, CT abdomen pelvis 08/22/2023, CT abdomen pelvis 05/24/2023 FINDINGS: Lower chest: No acute abnormality. Hepatobiliary: No focal liver abnormality. No gallstones, gallbladder wall thickening, or pericholecystic fluid. No biliary dilatation. Pancreas: Grossly stable 1.6 cm proximal pancreas fluid dense lesion (3:24). Normal pancreatic contour. Interval increase in proximal pancreas peripancreatic free fluid with mass effect on the proximal portal vein with focal marked stenosis (3:22). No main pancreatic ductal dilatation. Spleen: Normal in size without focal abnormality. Adrenals/Urinary Tract: No adrenal nodule bilaterally. Bilateral kidneys enhance symmetrically. No hydronephrosis. No hydroureter. The urinary bladder is unremarkable. Stomach/Bowel: Stomach is within normal limits. Small bowel loops within the lower anterior abdomen dilated with air and fluid measuring up to 3.7 cm in caliber. No definite transition point identified. No evidence of large bowel wall thickening or dilatation. Appendix appears normal. Vascular/Lymphatic: No abdominal aorta or iliac aneurysm. No abdominal, pelvic, or inguinal lymphadenopathy. Reproductive: Prostate enlarged measuring 4.9 cm. Other: Upper abdomen simple free fluid. No intraperitoneal free gas. No organized fluid collection. Musculoskeletal: No abdominal wall hernia or abnormality. No suspicious lytic or blastic osseous lesions. No acute displaced fracture. IMPRESSION: 1. Interval worsening acute pancreatitis. 2. Grossly stable 1.6 cm proximal pancreas fluid dense lesion with mass  effect on the proximal portal vein with focal marked stenosis. Underlying thrombus not excluded. 3. Small bowel loops within the lower anterior abdomen dilated with air and fluid measuring up to 3.7 cm in caliber. No definite transition point identified. Finding could represent ileus versus partial small bowel obstruction. 4. Prostatomegaly. 5.  Aortic Atherosclerosis (ICD10-I70.0). Electronically Signed   By: Tish Frederickson M.D.   On: 02/03/2024 00:00     Subjective: Patient seen examined bedside, lying in bed.  Tolerating advance diet.  Long discussion once again regarding need for complete abstinence/cessation from alcohol use as this is leading etiology to his recurrent hospitalizations/ED visits.  No other specific complaints, concerns or questions at this time.  Denies headache, no dizziness, no chest pain, no palpitations, no shortness of breath, no significant abdominal pain, no fever/chills/night sweats, no nausea/vomiting/diarrhea, no focal weakness, no fatigue, no paresthesias.  No acute events overnight per nursing.  Discharge Exam: Vitals:   02/17/24 0326 02/17/24 0558  BP: 106/66 105/74  Pulse: 84 (!) 101  Resp: 17 16  Temp: 98.3 F (36.8 C)   SpO2: 100% 100%   Vitals:   02/16/24 1953 02/16/24 2332 02/17/24 0326 02/17/24 0558  BP: 110/75 (!) 104/59 106/66 105/74  Pulse: 87 79 84 (!) 101  Resp: 17 16 17 16   Temp: 98.3 F (36.8 C)  98.3 F (36.8 C)   TempSrc:      SpO2: 100% 100% 100% 100%  Weight:      Height:        Physical Exam: GEN: NAD, alert and oriented x 3,  chronically ill appearance, appears older than stated age HEENT: NCAT, PERRL, EOMI, sclera clear, MMM PULM: CTAB w/o wheezes/crackles, normal respiratory effort on room air CV: RRR w/o M/G/R GI: abd soft, NTND, NABS, no R/G/M MSK: no peripheral edema, muscle strength globally intact 5/5 bilateral upper/lower extremities NEURO: CN II-XII intact, no focal deficits, sensation to light touch intact PSYCH:  normal mood/affect Integumentary: No concerning rashes/lesions/wounds noted on exposed skin surfaces    The results of significant diagnostics from this hospitalization (including imaging, microbiology, ancillary and laboratory) are listed below for reference.     Microbiology: No results found for this or any previous visit (from the past 240 hours).   Labs: BNP (last 3 results) Recent Labs    12/08/23 0452  BNP 78.3   Basic Metabolic Panel: Recent Labs  Lab 02/13/24 1100 02/15/24 0407 02/17/24 0237  NA 142 136 134*  K 3.7 3.7 3.9  CL 103 104 104  CO2 23 27 22   GLUCOSE 113* 94 110*  BUN 10 <5* 9  CREATININE 0.52* 0.58* 0.65  CALCIUM 9.4 9.1 8.4*  MG  --  1.8 1.8  PHOS  --  3.5  --    Liver Function Tests: Recent Labs  Lab 02/13/24 1100 02/15/24 0407  AST 22 14*  ALT 15 11  ALKPHOS 72 64  BILITOT 0.8 0.7  PROT 8.3* 6.4*  ALBUMIN 4.4 3.4*   Recent Labs  Lab 02/13/24 1100 02/15/24 0407 02/17/24 0237  LIPASE 279* 211* 62*   No results for input(s): "AMMONIA" in the last 168 hours. CBC: Recent Labs  Lab 02/13/24 1100 02/15/24 0407  WBC 5.0 3.0*  NEUTROABS 3.5  --   HGB 13.6 10.3*  HCT 39.9 31.4*  MCV 90.9 94.3  PLT 227 129*   Cardiac Enzymes: No results for input(s): "CKTOTAL", "CKMB", "CKMBINDEX", "TROPONINI" in the last 168 hours. BNP: Invalid input(s): "POCBNP" CBG: No results for input(s): "GLUCAP" in the last 168 hours. D-Dimer No results for input(s): "DDIMER" in the last 72 hours. Hgb A1c No results for input(s): "HGBA1C" in the last 72 hours. Lipid Profile No results for input(s): "CHOL", "HDL", "LDLCALC", "TRIG", "CHOLHDL", "LDLDIRECT" in the last 72 hours. Thyroid function studies No results for input(s): "TSH", "T4TOTAL", "T3FREE", "THYROIDAB" in the last 72 hours.  Invalid input(s): "FREET3" Anemia work up No results for input(s): "VITAMINB12", "FOLATE", "FERRITIN", "TIBC", "IRON", "RETICCTPCT" in the last 72  hours. Urinalysis    Component Value Date/Time   COLORURINE YELLOW 02/03/2024 0105   APPEARANCEUR CLEAR 02/03/2024 0105   APPEARANCEUR Clear 11/15/2014 1755   LABSPEC 1.043 (H) 02/03/2024 0105   LABSPEC 1.002 11/15/2014 1755   PHURINE 9.0 (H) 02/03/2024 0105   GLUCOSEU NEGATIVE 02/03/2024 0105   GLUCOSEU Negative 11/15/2014 1755   HGBUR NEGATIVE 02/03/2024 0105   BILIRUBINUR NEGATIVE 02/03/2024 0105   BILIRUBINUR Negative 11/15/2014 1755   KETONESUR NEGATIVE 02/03/2024 0105   PROTEINUR NEGATIVE 02/03/2024 0105   NITRITE NEGATIVE 02/03/2024 0105   LEUKOCYTESUR NEGATIVE 02/03/2024 0105   LEUKOCYTESUR Negative 11/15/2014 1755   Sepsis Labs Recent Labs  Lab 02/13/24 1100 02/15/24 0407  WBC 5.0 3.0*   Microbiology No results found for this or any previous visit (from the past 240 hours).   Time coordinating discharge: Over 30 minutes  SIGNED:   Alvira Philips [place], DO  [facility] 02/17/2024, 10:17 AM

## 2024-02-19 ENCOUNTER — Other Ambulatory Visit: Payer: Self-pay

## 2024-02-19 ENCOUNTER — Encounter (HOSPITAL_COMMUNITY): Payer: Self-pay | Admitting: Internal Medicine

## 2024-02-19 ENCOUNTER — Inpatient Hospital Stay (HOSPITAL_COMMUNITY)
Admission: EM | Admit: 2024-02-19 | Discharge: 2024-02-24 | DRG: 439 | Disposition: A | Discharge status: Home / Self Care | Payer: MEDICAID | Attending: Student | Admitting: Student

## 2024-02-19 DIAGNOSIS — K920 Hematemesis: Secondary | ICD-10-CM | POA: Diagnosis not present

## 2024-02-19 DIAGNOSIS — Z716 Tobacco abuse counseling: Secondary | ICD-10-CM

## 2024-02-19 DIAGNOSIS — Z789 Other specified health status: Secondary | ICD-10-CM | POA: Diagnosis not present

## 2024-02-19 DIAGNOSIS — R54 Age-related physical debility: Secondary | ICD-10-CM | POA: Diagnosis present

## 2024-02-19 DIAGNOSIS — B20 Human immunodeficiency virus [HIV] disease: Secondary | ICD-10-CM | POA: Diagnosis present

## 2024-02-19 DIAGNOSIS — K567 Ileus, unspecified: Secondary | ICD-10-CM | POA: Diagnosis not present

## 2024-02-19 DIAGNOSIS — F209 Schizophrenia, unspecified: Secondary | ICD-10-CM | POA: Diagnosis present

## 2024-02-19 DIAGNOSIS — F10129 Alcohol abuse with intoxication, unspecified: Secondary | ICD-10-CM | POA: Diagnosis present

## 2024-02-19 DIAGNOSIS — G40909 Epilepsy, unspecified, not intractable, without status epilepticus: Secondary | ICD-10-CM | POA: Diagnosis present

## 2024-02-19 DIAGNOSIS — R1010 Upper abdominal pain, unspecified: Secondary | ICD-10-CM | POA: Diagnosis present

## 2024-02-19 DIAGNOSIS — Z21 Asymptomatic human immunodeficiency virus [HIV] infection status: Secondary | ICD-10-CM | POA: Diagnosis not present

## 2024-02-19 DIAGNOSIS — F17210 Nicotine dependence, cigarettes, uncomplicated: Secondary | ICD-10-CM | POA: Diagnosis present

## 2024-02-19 DIAGNOSIS — K860 Alcohol-induced chronic pancreatitis: Secondary | ICD-10-CM | POA: Diagnosis present

## 2024-02-19 DIAGNOSIS — Z800 Family history of malignant neoplasm of digestive organs: Secondary | ICD-10-CM | POA: Diagnosis not present

## 2024-02-19 DIAGNOSIS — Z7141 Alcohol abuse counseling and surveillance of alcoholic: Secondary | ICD-10-CM

## 2024-02-19 DIAGNOSIS — K852 Alcohol induced acute pancreatitis without necrosis or infection: Secondary | ICD-10-CM | POA: Diagnosis present

## 2024-02-19 DIAGNOSIS — Z8659 Personal history of other mental and behavioral disorders: Secondary | ICD-10-CM

## 2024-02-19 DIAGNOSIS — Z681 Body mass index (BMI) 19 or less, adult: Secondary | ICD-10-CM | POA: Diagnosis not present

## 2024-02-19 DIAGNOSIS — Z765 Malingerer [conscious simulation]: Secondary | ICD-10-CM

## 2024-02-19 DIAGNOSIS — Z79899 Other long term (current) drug therapy: Secondary | ICD-10-CM

## 2024-02-19 DIAGNOSIS — K859 Acute pancreatitis without necrosis or infection, unspecified: Principal | ICD-10-CM

## 2024-02-19 DIAGNOSIS — Z888 Allergy status to other drugs, medicaments and biological substances status: Secondary | ICD-10-CM | POA: Diagnosis not present

## 2024-02-19 DIAGNOSIS — Z811 Family history of alcohol abuse and dependence: Secondary | ICD-10-CM

## 2024-02-19 DIAGNOSIS — F1010 Alcohol abuse, uncomplicated: Secondary | ICD-10-CM | POA: Diagnosis not present

## 2024-02-19 DIAGNOSIS — F3181 Bipolar II disorder: Secondary | ICD-10-CM | POA: Diagnosis present

## 2024-02-19 DIAGNOSIS — F4310 Post-traumatic stress disorder, unspecified: Secondary | ICD-10-CM | POA: Diagnosis present

## 2024-02-19 LAB — COMPREHENSIVE METABOLIC PANEL WITH GFR
ALT: 14 U/L (ref 0–44)
AST: 19 U/L (ref 15–41)
Albumin: 5 g/dL (ref 3.5–5.0)
Alkaline Phosphatase: 73 U/L (ref 38–126)
Anion gap: 15 (ref 5–15)
BUN: 9 mg/dL (ref 6–20)
CO2: 28 mmol/L (ref 22–32)
Calcium: 10.6 mg/dL — ABNORMAL HIGH (ref 8.9–10.3)
Chloride: 94 mmol/L — ABNORMAL LOW (ref 98–111)
Creatinine, Ser: 0.71 mg/dL (ref 0.61–1.24)
GFR, Estimated: >60 mL/min (ref >60)
Glucose, Bld: 104 mg/dL — ABNORMAL HIGH (ref 70–99)
Potassium: 3.4 mmol/L — ABNORMAL LOW (ref 3.5–5.1)
Sodium: 137 mmol/L (ref 135–145)
Total Bilirubin: 0.6 mg/dL (ref 0.0–1.2)
Total Protein: 9.4 g/dL — ABNORMAL HIGH (ref 6.5–8.1)

## 2024-02-19 LAB — CBC WITH DIFFERENTIAL/PLATELET
Abs Immature Granulocytes: 0.01 10*3/uL (ref 0.00–0.07)
Basophils Absolute: 0 10*3/uL (ref 0.0–0.1)
Basophils Relative: 1 %
Eosinophils Absolute: 0.1 10*3/uL (ref 0.0–0.5)
Eosinophils Relative: 2 %
HCT: 42.7 % (ref 39.0–52.0)
Hemoglobin: 14.5 g/dL (ref 13.0–17.0)
Immature Granulocytes: 0 %
Lymphocytes Relative: 30 %
Lymphs Abs: 1.7 10*3/uL (ref 0.7–4.0)
MCH: 30.9 pg (ref 26.0–34.0)
MCHC: 34 g/dL (ref 30.0–36.0)
MCV: 91 fL (ref 80.0–100.0)
Monocytes Absolute: 0.4 10*3/uL (ref 0.1–1.0)
Monocytes Relative: 6 %
Neutro Abs: 3.5 10*3/uL (ref 1.7–7.7)
Neutrophils Relative %: 61 %
Platelets: 284 10*3/uL (ref 150–400)
RBC: 4.69 MIL/uL (ref 4.22–5.81)
RDW: 14.4 % (ref 11.5–15.5)
WBC: 5.7 10*3/uL (ref 4.0–10.5)
nRBC: 0 % (ref 0.0–0.2)

## 2024-02-19 LAB — TROPONIN I (HIGH SENSITIVITY)
Troponin I (High Sensitivity): 4 ng/L (ref <18)
Troponin I (High Sensitivity): 3 ng/L (ref <18)

## 2024-02-19 LAB — LIPASE, BLOOD: Lipase: 2790 U/L — ABNORMAL HIGH (ref 11–51)

## 2024-02-19 LAB — ETHANOL: Alcohol, Ethyl (B): <10 mg/dL (ref <10)

## 2024-02-19 MED ORDER — LORAZEPAM 1 MG PO TABS
0.0000 mg | ORAL_TABLET | Freq: Four times a day (QID) | ORAL | Status: DC
Start: 2024-02-19 — End: 2024-02-19

## 2024-02-19 MED ORDER — LORAZEPAM 2 MG/ML IJ SOLN: 0.0000 mg | Freq: Two times a day (BID) | INTRAMUSCULAR | Status: DC

## 2024-02-19 MED ORDER — TRAZODONE HCL 50 MG PO TABS
50.0000 mg | ORAL_TABLET | Freq: Every evening | ORAL | Status: DC | PRN
  Administered 2024-02-20: 50 mg via ORAL
  Filled 2024-02-19: qty 1

## 2024-02-19 MED ORDER — ENOXAPARIN SODIUM 40 MG/0.4ML IJ SOSY
40.0000 mg | PREFILLED_SYRINGE | INTRAMUSCULAR | Status: DC
  Administered 2024-02-19: 40 mg via SUBCUTANEOUS
  Filled 2024-02-19 (×2): qty 0.4

## 2024-02-19 MED ORDER — LORAZEPAM 2 MG/ML IJ SOLN
1.0000 mg | Freq: Once | INTRAMUSCULAR | Status: AC
  Administered 2024-02-19: 1 mg via INTRAVENOUS
  Filled 2024-02-19: qty 1

## 2024-02-19 MED ORDER — LORAZEPAM 1 MG PO TABS
0.0000 mg | ORAL_TABLET | Freq: Two times a day (BID) | ORAL | Status: DC
Start: 2024-02-21 — End: 2024-02-19

## 2024-02-19 MED ORDER — ONDANSETRON HCL 4 MG/2ML IJ SOLN
4.0000 mg | Freq: Four times a day (QID) | INTRAMUSCULAR | Status: DC | PRN
  Administered 2024-02-19 – 2024-02-23 (×6): 4 mg via INTRAVENOUS
  Filled 2024-02-19 (×6): qty 2

## 2024-02-19 MED ORDER — KETOROLAC TROMETHAMINE 15 MG/ML IJ SOLN
15.0000 mg | Freq: Once | INTRAMUSCULAR | Status: AC
Start: 2024-02-19 — End: 2024-02-19
  Administered 2024-02-19: 15 mg via INTRAVENOUS
  Filled 2024-02-19: qty 1

## 2024-02-19 MED ORDER — LACTATED RINGERS IV BOLUS
1000.0000 mL | Freq: Once | INTRAVENOUS | Status: AC
  Administered 2024-02-19: 1000 mL via INTRAVENOUS

## 2024-02-19 MED ORDER — THIAMINE MONONITRATE 100 MG PO TABS
100.0000 mg | ORAL_TABLET | Freq: Every day | ORAL | Status: DC
  Administered 2024-02-20 – 2024-02-24 (×5): 100 mg via ORAL
  Filled 2024-02-19 (×6): qty 1

## 2024-02-19 MED ORDER — IBUPROFEN 200 MG PO TABS: 400.0000 mg | ORAL_TABLET | Freq: Four times a day (QID) | ORAL | Status: DC | PRN

## 2024-02-19 MED ORDER — FOLIC ACID 1 MG PO TABS
1.0000 mg | ORAL_TABLET | Freq: Every day | ORAL | Status: DC
  Administered 2024-02-19 – 2024-02-24 (×6): 1 mg via ORAL
  Filled 2024-02-19 (×6): qty 1

## 2024-02-19 MED ORDER — LORAZEPAM 2 MG/ML IJ SOLN
1.0000 mg | INTRAMUSCULAR | Status: DC | PRN
  Administered 2024-02-19 – 2024-02-20 (×2): 2 mg via INTRAVENOUS
  Administered 2024-02-20: 1 mg via INTRAVENOUS
  Filled 2024-02-19 (×3): qty 1

## 2024-02-19 MED ORDER — HYDROMORPHONE HCL 1 MG/ML IJ SOLN
2.0000 mg | INTRAMUSCULAR | Status: DC | PRN
  Administered 2024-02-19 – 2024-02-20 (×7): 2 mg via INTRAVENOUS
  Filled 2024-02-19 (×7): qty 2

## 2024-02-19 MED ORDER — ONDANSETRON HCL 4 MG PO TABS
4.0000 mg | ORAL_TABLET | Freq: Four times a day (QID) | ORAL | Status: DC | PRN
Start: 2024-02-19 — End: 2024-02-24

## 2024-02-19 MED ORDER — THIAMINE HCL 100 MG/ML IJ SOLN
100.0000 mg | Freq: Every day | INTRAMUSCULAR | Status: DC
  Filled 2024-02-19 (×4): qty 2

## 2024-02-19 MED ORDER — FENTANYL CITRATE PF 50 MCG/ML IJ SOSY
50.0000 ug | PREFILLED_SYRINGE | Freq: Once | INTRAMUSCULAR | Status: AC
  Administered 2024-02-19: 50 ug via INTRAVENOUS
  Filled 2024-02-19: qty 1

## 2024-02-19 MED ORDER — LACTATED RINGERS IV SOLN: INTRAVENOUS | Status: DC

## 2024-02-19 MED ORDER — QUETIAPINE FUMARATE 25 MG PO TABS
50.0000 mg | ORAL_TABLET | Freq: Two times a day (BID) | ORAL | Status: DC
  Administered 2024-02-19 – 2024-02-24 (×11): 50 mg via ORAL
  Filled 2024-02-19 (×11): qty 2

## 2024-02-19 MED ORDER — OXYCODONE HCL 5 MG PO TABS
5.0000 mg | ORAL_TABLET | ORAL | Status: DC | PRN
  Administered 2024-02-19: 5 mg via ORAL
  Filled 2024-02-19: qty 1

## 2024-02-19 MED ORDER — ADULT MULTIVITAMIN W/MINERALS CH
1.0000 | ORAL_TABLET | Freq: Every day | ORAL | Status: DC
  Administered 2024-02-20 – 2024-02-24 (×5): 1 via ORAL
  Filled 2024-02-19 (×5): qty 1

## 2024-02-19 MED ORDER — LORAZEPAM 1 MG PO TABS
1.0000 mg | ORAL_TABLET | ORAL | Status: DC | PRN
  Administered 2024-02-21 (×2): 1 mg via ORAL
  Filled 2024-02-19 (×2): qty 1

## 2024-02-19 MED ORDER — THIAMINE HCL 100 MG/ML IJ SOLN
100.0000 mg | Freq: Once | INTRAMUSCULAR | Status: AC
  Administered 2024-02-19: 100 mg via INTRAVENOUS
  Filled 2024-02-19: qty 2

## 2024-02-19 MED ORDER — HYDROMORPHONE HCL 1 MG/ML IJ SOLN
0.5000 mg | INTRAMUSCULAR | Status: DC | PRN
  Administered 2024-02-19 (×5): 1 mg via INTRAVENOUS
  Filled 2024-02-19 (×5): qty 1

## 2024-02-19 MED ORDER — BICTEGRAVIR-EMTRICITAB-TENOFOV 50-200-25 MG PO TABS
1.0000 | ORAL_TABLET | Freq: Every day | ORAL | Status: DC
  Administered 2024-02-19 – 2024-02-24 (×6): 1 via ORAL
  Filled 2024-02-19 (×6): qty 1

## 2024-02-19 MED ORDER — LORAZEPAM 2 MG/ML IJ SOLN: 0.0000 mg | Freq: Four times a day (QID) | INTRAMUSCULAR | Status: DC

## 2024-02-19 MED ORDER — HYDROMORPHONE HCL 2 MG PO TABS: 2.0000 mg | ORAL_TABLET | ORAL | Status: DC | PRN

## 2024-02-19 NOTE — ED Provider Notes (Signed)
 [place] EMERGENCY DEPARTMENT AT [facility] Provider Note   CSN: [id number] Arrival date & time: 02/19/24  4098     History  Chief Complaint  Patient presents with   Abdominal Pain   Pancreatitis    Gary [name] is a 38 y.o. male who is well-known to this department for frequent visits for polysubstance use, malingering, and acute on chronic pancreatitis secondary to alcohol abuse.  He was just discharged on 4/10 for acute on chronic pancreatitis.  Was discharged, drink alcohol, and returns at this time with abdominal pain nausea vomiting consistent with his chronic pancreatitis.  HIV poorly compliant with antiretroviral therapy.  HPI     Home Medications Prior to Admission medications   Medication Sig Start Date End Date Taking? Authorizing Provider  bictegravir-emtricitabine-tenofovir AF (BIKTARVY) 50-200-25 MG TABS tablet Take 1 tablet by mouth daily. 01/28/24   Vu, Garnette Ka T, MD  QUEtiapine (SEROQUEL) 50 MG tablet Take 1 tablet (50 mg total) by mouth 2 (two) times daily. Patient taking differently: Take 100 mg by mouth at bedtime. For anxiety and sleep 12/25/23   Faith Homes, MD  famotidine (PEPCID) 20 MG tablet Take 1 tablet (20 mg total) by mouth 2 (two) times daily. Patient not taking: Reported on 06/01/2019 05/14/19 06/02/19  Mozell Arias, MD      Allergies    Tegretol [carbamazepine] and Caffeine    Review of Systems   Review of Systems  Gastrointestinal:  Positive for abdominal pain, nausea and vomiting.    Physical Exam Updated Vital Signs BP (!) 113/93 (BP Location: Right Arm)   Pulse 90   Temp 98.1 F (36.7 C) (Oral)   Resp 18   Ht 6' (1.829 m)   Wt 62.6 kg   SpO2 100%   BMI 18.72 kg/m  Physical Exam Vitals and nursing note reviewed.  Constitutional:      Appearance: He is not toxic-appearing.     Comments: Chronically ill-appearing  HENT:     Head: Normocephalic and atraumatic.     Mouth/Throat:     Mouth: Mucous membranes  are moist.     Pharynx: No oropharyngeal exudate or posterior oropharyngeal erythema.  Eyes:     General:        Right eye: No discharge.        Left eye: No discharge.     Conjunctiva/sclera: Conjunctivae normal.  Cardiovascular:     Rate and Rhythm: Normal rate and regular rhythm.     Pulses: Normal pulses.  Pulmonary:     Effort: Pulmonary effort is normal. No respiratory distress.     Breath sounds: Normal breath sounds. No wheezing or rales.  Abdominal:     General: Bowel sounds are normal. There is no distension.     Palpations: Abdomen is soft.     Tenderness: There is generalized abdominal tenderness. There is no right CVA tenderness, left CVA tenderness, guarding or rebound.  Musculoskeletal:        General: No deformity.     Cervical back: Neck supple.  Skin:    General: Skin is warm and dry.     Capillary Refill: Capillary refill takes less than 2 seconds.  Neurological:     Mental Status: He is alert. Mental status is at baseline.  Psychiatric:        Mood and Affect: Mood normal.     ED Results / Procedures / Treatments   Labs (all labs ordered are listed, but only abnormal results are displayed)  Labs Reviewed  COMPREHENSIVE METABOLIC PANEL WITH GFR - Abnormal; Notable for the following components:      Result Value   Potassium 3.4 (*)    Chloride 94 (*)    Glucose, Bld 104 (*)    Calcium 10.6 (*)    Total Protein 9.4 (*)    All other components within normal limits  LIPASE, BLOOD - Abnormal; Notable for the following components:   Lipase 2,790 (*)    All other components within normal limits  CBC WITH DIFFERENTIAL/PLATELET  ETHANOL  TROPONIN I (HIGH SENSITIVITY)  TROPONIN I (HIGH SENSITIVITY)    EKG None  Radiology No results found.  Procedures Procedures    Medications Ordered in ED Medications  LORazepam (ATIVAN) injection 0-4 mg (has no administration in time range)    Or  LORazepam (ATIVAN) tablet 0-4 mg (has no administration in time  range)  LORazepam (ATIVAN) injection 0-4 mg (has no administration in time range)    Or  LORazepam (ATIVAN) tablet 0-4 mg (has no administration in time range)  thiamine (VITAMIN B1) tablet 100 mg (has no administration in time range)    Or  thiamine (VITAMIN B1) injection 100 mg (has no administration in time range)  LORazepam (ATIVAN) injection 1 mg (1 mg Intravenous Given 02/19/24 0532)  ketorolac (TORADOL) 15 MG/ML injection 15 mg (15 mg Intravenous Given 02/19/24 0533)  lactated ringers bolus 1,000 mL (1,000 mLs Intravenous New Bag/Given 02/19/24 0639)  thiamine (VITAMIN B1) injection 100 mg (100 mg Intravenous Given 02/19/24 0638)  fentaNYL (SUBLIMAZE) injection 50 mcg (50 mcg Intravenous Given 02/19/24 1610)    ED Course/ Medical Decision Making/ A&P Clinical Course as of 02/19/24 0716  Sat Feb 19, 2024  0657 Consult to Dr. Amy Kansky, hospitalist who is agreeable to adding the patient for admission to his service but will be carryover from morning team to examine and admit.  I appreciate his in the collaboration of care with patient [RS]    Clinical Course User Index [RS] Gaye Scorza, Adelle Agent, PA-C                                 Medical Decision Making 38 y/o male who presents with concern for abdominal pain nausea vomiting.  Vitals reassuring on intake.  Cardiopulmonary exam unremarkable, abdominal exam with generalized tenderness palpation.  Patient chronically ill-appearing.  Differential diagnosis of epigastric pain includes but is not limited to: Functional or nonulcer dyspepsia (MCC), PUD, GERD, Gastritis, (NSAIDs, alcohol, stress, H. pylori, pernicious anemia), pancreatitis / pancreatic cancer, overeating indigestion (high-fat foods, coffee), drugs (aspirin, antibiotics (eg, macrolides, metronidazole), corticosteroids, digoxin, narcotics, theophylline), gastroparesis, gastric volvulus, gastric cancer, lactose intolerance, malabsorption, parasitic infection (Giardia,  Strongyloides, Ascaris), abdominal hernia, intestinal ischemia, esophageal rupture,  cholelithiasis /choledocholithiasis / cholangitis, hepatitis, ACS, pericarditis, pneumonia, pregnancy.   Amount and/or Complexity of Data Reviewed Labs: ordered.    Details: CBC unremarkable, CMP with mild hypokalemia of 3.3.  EtOH negative, troponin negative, lipase significantly elevated from patient's baseline now at 2790.   Risk OTC drugs. Prescription drug management. Decision regarding hospitalization.    Patient will require mission the hospital for acute on chronic pancreatitis.  Lipase significantly elevated from patient's baseline.  Consult to hospitalist as above.   Audley voiced understanding of her medical evaluation and treatment plan. Each of their questions answered to their expressed satisfaction.   This chart was dictated using voice recognition software, Dragon. Despite the best efforts  of this provider to proofread and correct errors, errors may still occur which can change documentation meaning.         Final Clinical Impression(s) / ED Diagnoses Final diagnoses:  Acute on chronic pancreatitis [facility])    Rx / DC Orders ED Discharge Orders     None         Arlyne Lame 02/19/24 5784    Palumbo, April, MD 02/19/24 2320

## 2024-02-19 NOTE — H&P (Signed)
 History and Physical  Gary [name] QMV:[id number] DOB: 1986/02/15 DOA: 02/19/2024  PCP: Patient, No Pcp Per   Chief Complaint: Abdominal pain, vomiting  HPI: Gary [name] is a 38 y.o. male with medical history significant for alcohol abuse, chronic pancreatitis with frequent hospitalizations for acute on chronic pancreatitis, HIV/AIDS, medication noncompliance, seizure disorder, bipolar schizophrenia, PTSD and homelessness admitted with recurrent acute on chronic pancreatitis.  Patient was just discharged from the hospital on 4/10 after stay for recurrent acute alcoholic pancreatitis.  States that after discharge, he did well for about 24 hours, states that yesterday he had a couple of shots of vodka but threw it back up, after which he started having severe abdominal pain.  Denies any hematemesis, fevers, diarrhea or other concerning symptoms.  He was given pain and nausea medication as well as IV fluids, and hospitalist admission was requested.  Review of Systems: Please see HPI for pertinent positives and negatives. A complete 10 system review of systems are otherwise negative.  Past Medical History:  Diagnosis Date   Alcohol abuse    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   Anxiety    Bipolar 2 disorder (HCC)    HIV (human immunodeficiency virus infection) (HCC)    Pancreatitis    PTSD (post-traumatic stress disorder)    Schizophrenia (HCC)    Seizures (HCC)    Subdural hematoma (HCC)    Past Surgical History:  Procedure Laterality Date   BIOPSY  04/19/2022   Procedure: BIOPSY;  Surgeon: Normie Becton., MD;  Location: [facility] ENDOSCOPY;  Service: Gastroenterology;;   ENTEROSCOPY N/A 04/19/2022   Procedure: ENTEROSCOPY;  Surgeon: Normie Becton., MD;  Location: [facility] ENDOSCOPY;  Service: Gastroenterology;  Laterality: N/A;   INCISION AND DRAINAGE PERIRECTAL ABSCESS N/A 09/24/2016   Procedure: IRRIGATION AND DEBRIDEMENT PERIRECTAL ABSCESS;  Surgeon: Gwyndolyn Lerner,  MD;  Location: [facility] ORS;  Service: General;  Laterality: N/A;   none     Social History:  reports that he has been smoking cigarettes. He started smoking about 21 years ago. He has a 10.6 pack-year smoking history. He has never used smokeless tobacco. He reports current alcohol use of about 105.0 standard drinks of alcohol per week. He reports that he does not currently use drugs after having used the following drugs: Methamphetamines and Cocaine.  Allergies  Allergen Reactions   Tegretol [Carbamazepine] Other (See Comments)    Vertigo  Paralysis   Caffeine Palpitations    Family History  Problem Relation Age of Onset   Alcohol abuse Mother    Alcohol abuse Father    Colon cancer Other    Other Other    Cancer Other      Prior to Admission medications   Medication Sig Start Date End Date Taking? Authorizing Provider  bictegravir-emtricitabine-tenofovir AF (BIKTARVY) 50-200-25 MG TABS tablet Take 1 tablet by mouth daily. 01/28/24   Vu, Garnette Ka T, MD  QUEtiapine (SEROQUEL) 50 MG tablet Take 1 tablet (50 mg total) by mouth 2 (two) times daily. Patient taking differently: Take 100 mg by mouth at bedtime. For anxiety and sleep 12/25/23   Faith Homes, MD  famotidine (PEPCID) 20 MG tablet Take 1 tablet (20 mg total) by mouth 2 (two) times daily. Patient not taking: Reported on 06/01/2019 05/14/19 06/02/19  Mozell Arias, MD    Physical Exam: BP (!) 113/93 (BP Location: Right Arm)   Pulse 90   Temp 98.1 F (36.7 C) (Oral)   Resp 18   Ht 6' (1.829 m)   Wt  62.6 kg   SpO2 100%   BMI 18.72 kg/m  General:  Alert, oriented, calm, in no acute distress Cardiovascular: RRR, no murmurs or rubs, no peripheral edema  Respiratory: clear to auscultation bilaterally, no wheezes, no crackles  Abdomen: soft, nontender with voluntary guarding, no rebound, nondistended, normal bowel tones heard  Skin: dry, no rashes  Musculoskeletal: no joint effusions, normal range of motion  Psychiatric:  appropriate affect, normal speech  Neurologic: extraocular muscles intact, clear speech, moving all extremities with intact sensorium         Labs on Admission:  Basic Metabolic Panel: Recent Labs  Lab 02/13/24 1100 02/15/24 0407 02/17/24 0237 02/19/24 0502  NA 142 136 134* 137  K 3.7 3.7 3.9 3.4*  CL 103 104 104 94*  CO2 23 27 22 28   GLUCOSE 113* 94 110* 104*  BUN 10 <5* 9 9  CREATININE 0.52* 0.58* 0.65 0.71  CALCIUM 9.4 9.1 8.4* 10.6*  MG  --  1.8 1.8  --   PHOS  --  3.5  --   --    Liver Function Tests: Recent Labs  Lab 02/13/24 1100 02/15/24 0407 02/19/24 0502  AST 22 14* 19  ALT 15 11 14   ALKPHOS 72 64 73  BILITOT 0.8 0.7 0.6  PROT 8.3* 6.4* 9.4*  ALBUMIN 4.4 3.4* 5.0   Recent Labs  Lab 02/13/24 1100 02/15/24 0407 02/17/24 0237 02/19/24 0502  LIPASE 279* 211* 62* 2,790*   No results for input(s): "AMMONIA" in the last 168 hours. CBC: Recent Labs  Lab 02/13/24 1100 02/15/24 0407 02/19/24 0502  WBC 5.0 3.0* 5.7  NEUTROABS 3.5  --  3.5  HGB 13.6 10.3* 14.5  HCT 39.9 31.4* 42.7  MCV 90.9 94.3 91.0  PLT 227 129* 284   Cardiac Enzymes: No results for input(s): "CKTOTAL", "CKMB", "CKMBINDEX", "TROPONINI" in the last 168 hours. BNP (last 3 results) Recent Labs    12/08/23 0452  BNP 78.3    ProBNP (last 3 results) No results for input(s): "PROBNP" in the last 8760 hours.  CBG: No results for input(s): "GLUCAP" in the last 168 hours.  Radiological Exams on Admission: No results found.  Assessment/Plan Gary [name] is a 38 y.o. male with medical history significant for alcohol abuse, chronic pancreatitis with frequent hospitalizations for acute on chronic pancreatitis, HIV/AIDS, medication noncompliance, seizure disorder, bipolar schizophrenia, PTSD and homelessness admitted with recurrent acute on chronic pancreatitis.   Acute on chronic pancreatitis-with abdominal pain, vomiting, and significantly elevated lipase.  No fevers. -Inpatient  admission -N.p.o. except for ice chips -Aggressive fluid hydration -Pain and nausea medication if needed -Will defer imaging unless fever or other complication develops  HIV-continue Biktarvy  Alcohol use disorder-note negative alcohol level, currently patient is resting comfortably without signs or symptoms of alcohol withdrawal -Thiamine, folate, multivitamin -IV or p.o. Ativan per CIWA protocol  Bipolar schizophrenia-continue Seroquel twice daily  DVT prophylaxis: Lovenox     Code Status: Full Code  Consults called: None  Admission status: The appropriate patient status for this patient is INPATIENT. Inpatient status is judged to be reasonable and necessary in order to provide the required intensity of service to ensure the patient's safety. The patient's presenting symptoms, physical exam findings, and initial radiographic and laboratory data in the context of their chronic comorbidities is felt to place them at high risk for further clinical deterioration. Furthermore, it is not anticipated that the patient will be medically stable for discharge from the hospital within 2 midnights  of admission.    I certify that at the point of admission it is my clinical judgment that the patient will require inpatient hospital care spanning beyond 2 midnights from the point of admission due to high intensity of service, high risk for further deterioration and high frequency of surveillance required  Time spent: 56 minutes  Jacobi Nile Rickey Charm MD [facility] Pager [phone]  If 7PM-7AM, please contact night-coverage [email or web address] Password [facility]  02/19/2024, 7:53 AM

## 2024-02-19 NOTE — Plan of Care (Signed)
 Vitals:   02/19/24 0851 02/19/24 1339  BP: 104/83 100/61  Pulse: 81 60  Resp: 18 18  Temp: 98 F (36.7 C) 98.1 F (36.7 C)  SpO2: 100% 100%   Received patient from the ED. AOX4. Remains on RA. C/o nausea and pain given PRN meds with good relief noted. CIWA score 9 given ativan waiting for relief. Patient frequent asking to change his diet, education provided for the reason and MD notified. Safety precautions maintained. Problem: Education: Goal: Knowledge of General Education information will improve Description: Including pain rating scale, medication(s)/side effects and non-pharmacologic comfort measures Outcome: Progressing   Problem: Health Behavior/Discharge Planning: Goal: Ability to manage health-related needs will improve Outcome: Progressing   Problem: Clinical Measurements: Goal: Ability to maintain clinical measurements within normal limits will improve Outcome: Progressing Goal: Will remain free from infection Outcome: Progressing Goal: Diagnostic test results will improve Outcome: Progressing Goal: Respiratory complications will improve Outcome: Progressing Goal: Cardiovascular complication will be avoided Outcome: Progressing   Problem: Activity: Goal: Risk for activity intolerance will decrease Outcome: Progressing   Problem: Nutrition: Goal: Adequate nutrition will be maintained Outcome: Progressing   Problem: Coping: Goal: Level of anxiety will decrease Outcome: Progressing   Problem: Elimination: Goal: Will not experience complications related to bowel motility Outcome: Progressing Goal: Will not experience complications related to urinary retention Outcome: Progressing   Problem: Pain Managment: Goal: General experience of comfort will improve and/or be controlled Outcome: Progressing   Problem: Safety: Goal: Ability to remain free from injury will improve Outcome: Progressing   Problem: Skin Integrity: Goal: Risk for impaired skin  integrity will decrease Outcome: Progressing

## 2024-02-19 NOTE — ED Triage Notes (Signed)
 Coming by [facility], recently discharge from hospital 04/10. Pt report alcohol ingestion, last drink 04/11 at 830 and report emesis earlier.  Hx of Alcohol induced pancreatitis.  Homeless, Aox4, pain 10 out of 10.

## 2024-02-20 DIAGNOSIS — K852 Alcohol induced acute pancreatitis without necrosis or infection: Secondary | ICD-10-CM | POA: Diagnosis not present

## 2024-02-20 LAB — COMPREHENSIVE METABOLIC PANEL WITH GFR
ALT: 9 U/L (ref 0–44)
ALT: 10 U/L (ref 0–44)
AST: 16 U/L (ref 15–41)
AST: 12 U/L — ABNORMAL LOW (ref 15–41)
Albumin: 3.8 g/dL (ref 3.5–5.0)
Albumin: 3.6 g/dL (ref 3.5–5.0)
Alkaline Phosphatase: 57 U/L (ref 38–126)
Alkaline Phosphatase: 55 U/L (ref 38–126)
Anion gap: 11 (ref 5–15)
Anion gap: 9 (ref 5–15)
BUN: 5 mg/dL — ABNORMAL LOW (ref 6–20)
BUN: 6 mg/dL (ref 6–20)
CO2: 24 mmol/L (ref 22–32)
CO2: 27 mmol/L (ref 22–32)
Calcium: 9.1 mg/dL (ref 8.9–10.3)
Calcium: 9.3 mg/dL (ref 8.9–10.3)
Chloride: 95 mmol/L — ABNORMAL LOW (ref 98–111)
Chloride: 97 mmol/L — ABNORMAL LOW (ref 98–111)
Creatinine, Ser: 0.43 mg/dL — ABNORMAL LOW (ref 0.61–1.24)
Creatinine, Ser: 0.7 mg/dL (ref 0.61–1.24)
GFR, Estimated: >60 mL/min (ref >60)
GFR, Estimated: >60 mL/min (ref >60)
Glucose, Bld: 90 mg/dL (ref 70–99)
Glucose, Bld: 82 mg/dL (ref 70–99)
Potassium: 3.9 mmol/L (ref 3.5–5.1)
Potassium: 4 mmol/L (ref 3.5–5.1)
Sodium: 130 mmol/L — ABNORMAL LOW (ref 135–145)
Sodium: 133 mmol/L — ABNORMAL LOW (ref 135–145)
Total Bilirubin: 1 mg/dL (ref 0.0–1.2)
Total Bilirubin: 0.8 mg/dL (ref 0.0–1.2)
Total Protein: 7.2 g/dL (ref 6.5–8.1)
Total Protein: 6.8 g/dL (ref 6.5–8.1)

## 2024-02-20 LAB — CBC
HCT: 38.9 % — ABNORMAL LOW (ref 39.0–52.0)
Hemoglobin: 12.9 g/dL — ABNORMAL LOW (ref 13.0–17.0)
MCH: 31.1 pg (ref 26.0–34.0)
MCHC: 33.2 g/dL (ref 30.0–36.0)
MCV: 93.7 fL (ref 80.0–100.0)
Platelets: 159 10*3/uL (ref 150–400)
RBC: 4.15 MIL/uL — ABNORMAL LOW (ref 4.22–5.81)
RDW: 14 % (ref 11.5–15.5)
WBC: 6 10*3/uL (ref 4.0–10.5)
nRBC: 0 % (ref 0.0–0.2)

## 2024-02-20 LAB — LIPASE, BLOOD: Lipase: 329 U/L — ABNORMAL HIGH (ref 11–51)

## 2024-02-20 MED ORDER — LACTATED RINGERS IV SOLN: INTRAVENOUS | Status: AC

## 2024-02-20 MED ORDER — MORPHINE SULFATE (PF) 2 MG/ML IV SOLN
2.0000 mg | INTRAVENOUS | Status: DC | PRN
  Administered 2024-02-20 – 2024-02-21 (×8): 2 mg via INTRAVENOUS
  Filled 2024-02-20 (×8): qty 1

## 2024-02-20 MED ORDER — SIMETHICONE 80 MG PO CHEW
80.0000 mg | CHEWABLE_TABLET | Freq: Once | ORAL | Status: AC
  Administered 2024-02-20: 80 mg via ORAL
  Filled 2024-02-20: qty 1

## 2024-02-20 MED ORDER — ACETAMINOPHEN 325 MG PO TABS
650.0000 mg | ORAL_TABLET | Freq: Four times a day (QID) | ORAL | Status: DC | PRN
  Filled 2024-02-20: qty 2

## 2024-02-20 MED ORDER — NICOTINE 14 MG/24HR TD PT24
14.0000 mg | MEDICATED_PATCH | Freq: Every day | TRANSDERMAL | Status: DC
  Administered 2024-02-20 – 2024-02-24 (×5): 14 mg via TRANSDERMAL
  Filled 2024-02-20 (×5): qty 1

## 2024-02-20 NOTE — Plan of Care (Signed)

## 2024-02-20 NOTE — Hospital Course (Signed)
 37yo with h/o alcohol abuse, chronic pancreatitis with frequent hospitalizations for acute on chronic pancreatitis, HIV/AIDS, medication noncompliance, seizure disorder, bipolar schizophrenia, PTSD and homelessness who was previously admitted from 4/6-10 for acute on chronic alcoholic pancreatitis who presented with the same on 4/12 after resuming ETOH use shortly after discharge.  No recorded emesis since admission.

## 2024-02-20 NOTE — Progress Notes (Signed)
 Progress Note   Patient: Gary [name] WJX:[id number] DOB: 18-Sep-1986 DOA: 02/19/2024     1 DOS: the patient was seen and examined on 02/20/2024   Brief hospital course: 38yo with h/o alcohol abuse, chronic pancreatitis with frequent hospitalizations for acute on chronic pancreatitis, HIV/AIDS, medication noncompliance, seizure disorder, bipolar schizophrenia, PTSD and homelessness who was previously admitted from 4/6-10 for acute on chronic alcoholic pancreatitis who presented with the same on 4/12 after resuming ETOH use shortly after discharge.  No recorded emesis since admission.  Assessment and Plan:  Acute on chronic pancreatitis in the setting of continued alcohol abuse Patient presenting with recurrent abdominal pain in the setting of continued alcohol use disorder Previously admitted with the same, drank again within 24 hours and returned with recurrent symptoms Discussion regarding need for complete abstinence/cessation of alcohol given his recurrent pancreatitis events, as this is the etiology to his multiple hospitalizations and ED visits Continue aggressive LR hydration   Bipolar disorder/schizophrenia Seroquel 50 mg p.o. twice daily   HIV Continue Biktarvy. Outpatient follow-up with infectious disease   Alcohol intoxication Alcohol use disorder Patient with continued alcohol abuse, last drink day of admission, shortly after last dc Monitor on CIWA protocol  Counseled on complete alcohol cessation/abstinence TOC evaluation for possible halfway house program at time of dc   Tobacco use disorder Counseled on need for complete cessation Nicotine patch ordered         Consultants: None   Procedures: None   Antibiotics: None  30 Day Unplanned Readmission Risk Score    Flowsheet Row ED to Hosp-Admission (Current) from 02/19/2024 in [facility]  30 Day Unplanned Readmission Risk Score (%) 96.25 Filed at 02/20/2024 1600        This score is the patient's risk of an unplanned readmission within 30 days of being discharged (0 -100%). The score is based on dignosis, age, lab data, medications, orders, and past utilization.   Low:  0-14.9   Medium: 15-21.9   High: 22-29.9   Extreme: 30 and above           Subjective: Ongoing abdominal pain, tolerating minimal sips/chips.  Has had hematemesis.  He is open to long-term treatment/halfway house program and wants to stop drinking.   Objective: Vitals:   02/20/24 1156 02/20/24 1303  BP: 117/80 97/60  Pulse: 98 86  Resp: 18   Temp: 98.3 F (36.8 C)   SpO2: 99%     Intake/Output Summary (Last 24 hours) at 02/20/2024 1752 Last data filed at 02/20/2024 1500 Gross per 24 hour  Intake 757.06 ml  Output --  Net 757.06 ml   Filed Weights   02/19/24 0433 02/19/24 0435  Weight: 63.5 kg 62.6 kg    Exam:  General:  Appears calm and comfortable and is in NAD, frail, chronically ill Eyes:  EOMI, normal lids, injected conjunctivae ENT:  grossly normal hearing, lips & tongue, mmm Cardiovascular:  RRR, no m/r/g. No LE edema.  Respiratory:   CTA bilaterally with no wheezes/rales/rhonchi.  Normal respiratory effort. Abdomen:  soft, diffuse abdominal pain, ND Skin:  no rash or induration seen on limited exam Musculoskeletal:  grossly normal tone BUE/BLE, good ROM, no bony abnormality Psychiatric:  blunted mood and affect, speech fluent and appropriate, AOx3 Neurologic:  CN 2-12 grossly intact, moves all extremities in coordinated fashion   Data Reviewed: I have reviewed the patient's lab results since admission.  Pertinent labs for today include:   Na++ 130 BUN  5/Creatinine 0.43/GFR >60 WBC 6 Hgb 12.9 ETOH <10    Family Communication: None present  Disposition: Status is: Inpatient Remains inpatient appropriate because: ongoing management     Time spent: 50 minutes  Unresulted Labs (From admission, onward)     Start     Ordered    02/21/24 0500  CBC with Differential/Platelet  Tomorrow morning,   R        02/20/24 1752   02/20/24 1753  Comprehensive metabolic panel  Once,   R        02/20/24 1752             Author: Lorita Rosa, MD 02/20/2024 5:52 PM  For on call review [email or web address].

## 2024-02-21 DIAGNOSIS — K852 Alcohol induced acute pancreatitis without necrosis or infection: Secondary | ICD-10-CM | POA: Diagnosis not present

## 2024-02-21 LAB — COMPREHENSIVE METABOLIC PANEL WITH GFR
ALT: 8 U/L (ref 0–44)
AST: 11 U/L — ABNORMAL LOW (ref 15–41)
Albumin: 3.1 g/dL — ABNORMAL LOW (ref 3.5–5.0)
Alkaline Phosphatase: 44 U/L (ref 38–126)
Anion gap: 11 (ref 5–15)
BUN: 11 mg/dL (ref 6–20)
CO2: 23 mmol/L (ref 22–32)
Calcium: 8.8 mg/dL — ABNORMAL LOW (ref 8.9–10.3)
Chloride: 100 mmol/L (ref 98–111)
Creatinine, Ser: 0.62 mg/dL (ref 0.61–1.24)
GFR, Estimated: >60 mL/min (ref >60)
Glucose, Bld: 59 mg/dL — ABNORMAL LOW (ref 70–99)
Potassium: 3.8 mmol/L (ref 3.5–5.1)
Sodium: 134 mmol/L — ABNORMAL LOW (ref 135–145)
Total Bilirubin: 1 mg/dL (ref 0.0–1.2)
Total Protein: 6 g/dL — ABNORMAL LOW (ref 6.5–8.1)

## 2024-02-21 LAB — CBC WITH DIFFERENTIAL/PLATELET
Abs Immature Granulocytes: 0.02 10*3/uL (ref 0.00–0.07)
Basophils Absolute: 0 10*3/uL (ref 0.0–0.1)
Basophils Relative: 1 %
Eosinophils Absolute: 0 10*3/uL (ref 0.0–0.5)
Eosinophils Relative: 2 %
HCT: 31.2 % — ABNORMAL LOW (ref 39.0–52.0)
Hemoglobin: 10 g/dL — ABNORMAL LOW (ref 13.0–17.0)
Immature Granulocytes: 1 %
Lymphocytes Relative: 32 %
Lymphs Abs: 0.7 10*3/uL (ref 0.7–4.0)
MCH: 30.8 pg (ref 26.0–34.0)
MCHC: 32.1 g/dL (ref 30.0–36.0)
MCV: 96 fL (ref 80.0–100.0)
Monocytes Absolute: 0.3 10*3/uL (ref 0.1–1.0)
Monocytes Relative: 15 %
Neutro Abs: 1.1 10*3/uL — ABNORMAL LOW (ref 1.7–7.7)
Neutrophils Relative %: 49 %
Platelets: 106 10*3/uL — ABNORMAL LOW (ref 150–400)
RBC: 3.25 MIL/uL — ABNORMAL LOW (ref 4.22–5.81)
RDW: 14.1 % (ref 11.5–15.5)
WBC: 2.2 10*3/uL — ABNORMAL LOW (ref 4.0–10.5)
nRBC: 0 % (ref 0.0–0.2)

## 2024-02-21 LAB — GLUCOSE, CAPILLARY
Glucose-Capillary: 51 mg/dL — ABNORMAL LOW (ref 70–99)
Glucose-Capillary: 147 mg/dL — ABNORMAL HIGH (ref 70–99)
Glucose-Capillary: 115 mg/dL — ABNORMAL HIGH (ref 70–99)
Glucose-Capillary: 46 mg/dL — ABNORMAL LOW (ref 70–99)
Glucose-Capillary: 55 mg/dL — ABNORMAL LOW (ref 70–99)

## 2024-02-21 MED ORDER — MORPHINE SULFATE (PF) 2 MG/ML IV SOLN
2.0000 mg | INTRAVENOUS | Status: DC | PRN
  Administered 2024-02-21 – 2024-02-22 (×9): 4 mg via INTRAVENOUS
  Filled 2024-02-21 (×9): qty 2

## 2024-02-21 MED ORDER — KETOROLAC TROMETHAMINE 30 MG/ML IJ SOLN
30.0000 mg | Freq: Four times a day (QID) | INTRAMUSCULAR | Status: DC | PRN
  Administered 2024-02-22 – 2024-02-24 (×4): 30 mg via INTRAVENOUS
  Filled 2024-02-21 (×4): qty 1

## 2024-02-21 MED ORDER — DEXTROSE 50 % IV SOLN: 25.0000 g | Freq: Once | INTRAVENOUS | Status: AC

## 2024-02-21 MED ORDER — DEXTROSE 50 % IV SOLN
INTRAVENOUS | Status: AC
  Administered 2024-02-21: 25 g via INTRAVENOUS
  Filled 2024-02-21: qty 50

## 2024-02-21 MED ORDER — KETOROLAC TROMETHAMINE 30 MG/ML IJ SOLN: 30.0000 mg | Freq: Four times a day (QID) | INTRAMUSCULAR | Status: DC | PRN

## 2024-02-21 MED ORDER — DEXTROSE 50 % IV SOLN
INTRAVENOUS | Status: AC
Start: 2024-02-21 — End: 2024-02-21
  Administered 2024-02-21: 25 g via INTRAVENOUS
  Filled 2024-02-21: qty 50

## 2024-02-21 NOTE — Progress Notes (Signed)
 Progress Note   Patient: Gary [name] RUE:[id number] DOB: 05/14/1986 DOA: 02/19/2024     2 DOS: the patient was seen and examined on 02/21/2024   Brief hospital course: 38yo with h/o alcohol abuse, chronic pancreatitis with frequent hospitalizations for acute on chronic pancreatitis, HIV/AIDS, medication noncompliance, seizure disorder, bipolar schizophrenia, PTSD and homelessness who was previously admitted from 4/6-10 for acute on chronic alcoholic pancreatitis who presented with the same on 4/12 after resuming ETOH use shortly after discharge.  No recorded emesis since admission.  Assessment and Plan:  Acute on chronic pancreatitis in the setting of continued alcohol abuse Patient presenting with recurrent abdominal pain in the setting of continued alcohol use disorder Previously admitted last week with the same, drank again within 24 hours and returned with recurrent symptoms Discussion regarding need for complete abstinence/cessation of alcohol given his recurrent pancreatitis events, as this is the etiology to his multiple hospitalizations and ED visits Continue aggressive LR hydration Changed from Dilaudid to morphine and patient is VERY unhappy about this; offered increased dose of medication vs. Advancing diet (he also reports that he is starving) and he requested pain medication Will increase morphine range to 2-4 mg for moderate to severe pain and add Toradol for breakthrough pain   Bipolar disorder/schizophrenia Seroquel 50 mg p.o. twice daily   HIV Continue Biktarvy. Outpatient follow-up with infectious disease   Alcohol intoxication Alcohol use disorder Patient with continued alcohol abuse, last drink day of admission, shortly after last dc Monitor on CIWA protocol  Counseled on complete alcohol cessation/abstinence TOC evaluation for possible halfway house program at time of dc   Tobacco use disorder Counseled on need for complete cessation Nicotine patch ordered          Consultants: None   Procedures: None   Antibiotics: None   30 Day Unplanned Readmission Risk Score    Flowsheet Row ED to Hosp-Admission (Current) from 02/19/2024 in [facility]  30 Day Unplanned Readmission Risk Score (%) 97.15 Filed at 02/21/2024 0401       This score is the patient's risk of an unplanned readmission within 30 days of being discharged (0 -100%). The score is based on dignosis, age, lab data, medications, orders, and past utilization.   Low:  0-14.9   Medium: 15-21.9   High: 22-29.9   Extreme: 30 and above           Subjective: He is furious today and reports terribly uncontrolled pain since his Dilaudid was changed to morphine yesterday.  He is miserable.  No n/v, just diffuse crippling abdominal pain.   Objective: Vitals:   02/20/24 2235 02/21/24 0446  BP: 109/70 (!) 90/54  Pulse: 89 73  Resp: 17 16  Temp:  98.3 F (36.8 C)  SpO2:  99%    Intake/Output Summary (Last 24 hours) at 02/21/2024 1123 Last data filed at 02/20/2024 1500 Gross per 24 hour  Intake 240 ml  Output --  Net 240 ml   Filed Weights   02/19/24 0433 02/19/24 0435  Weight: 63.5 kg 62.6 kg    Exam:  General:  Appears calm and comfortable and is in NAD, frail, chronically ill Eyes:  EOMI, normal lids, injected conjunctivae ENT:  grossly normal hearing, lips & tongue, mmm Cardiovascular:  RRR, no m/r/g. No LE edema.  Respiratory:   CTA bilaterally with no wheezes/rales/rhonchi.  Normal respiratory effort. Abdomen:  soft, diffuse abdominal pain, ND Skin:  no rash or induration seen on limited  exam Musculoskeletal:  grossly normal tone BUE/BLE, good ROM, no bony abnormality Psychiatric:  irritable mood and affect, speech fluent and appropriate, AOx3 Neurologic:  CN 2-12 grossly intact, moves all extremities in coordinated fashion  Data Reviewed: I have reviewed the patient's lab results since admission.  Pertinent labs for  today include:   WBC 2.2 Hgb 10 Platelets 106     Family Communication: None present  Disposition: Status is: Inpatient Remains inpatient appropriate because: ongoing management     Time spent: 50 minutes  Unresulted Labs (From admission, onward)     Start     Ordered   02/22/24 0500  CBC with Differential/Platelet  Tomorrow morning,   R        02/21/24 0805   02/21/24 0500  CBC with Differential/Platelet  Tomorrow morning,   R        02/20/24 1752             Author: Lorita Rosa, MD 02/21/2024 11:23 AM  For on call review [email or web address].

## 2024-02-21 NOTE — Plan of Care (Signed)

## 2024-02-22 DIAGNOSIS — K852 Alcohol induced acute pancreatitis without necrosis or infection: Secondary | ICD-10-CM | POA: Diagnosis not present

## 2024-02-22 LAB — CBC WITH DIFFERENTIAL/PLATELET
Abs Immature Granulocytes: 0 10*3/uL (ref 0.00–0.07)
Basophils Absolute: 0 10*3/uL (ref 0.0–0.1)
Basophils Relative: 0 %
Eosinophils Absolute: 0.1 10*3/uL (ref 0.0–0.5)
Eosinophils Relative: 3 %
HCT: 29.8 % — ABNORMAL LOW (ref 39.0–52.0)
Hemoglobin: 9.6 g/dL — ABNORMAL LOW (ref 13.0–17.0)
Lymphocytes Relative: 26 %
Lymphs Abs: 0.6 10*3/uL — ABNORMAL LOW (ref 0.7–4.0)
MCH: 31.3 pg (ref 26.0–34.0)
MCHC: 32.2 g/dL (ref 30.0–36.0)
MCV: 97.1 fL (ref 80.0–100.0)
Monocytes Absolute: 0.1 10*3/uL (ref 0.1–1.0)
Monocytes Relative: 5 %
Neutro Abs: 1.5 10*3/uL — ABNORMAL LOW (ref 1.7–7.7)
Neutrophils Relative %: 66 %
Platelets: 133 10*3/uL — ABNORMAL LOW (ref 150–400)
RBC: 3.07 MIL/uL — ABNORMAL LOW (ref 4.22–5.81)
RDW: 14 % (ref 11.5–15.5)
Smear Review: NORMAL
WBC: 2.3 10*3/uL — ABNORMAL LOW (ref 4.0–10.5)
nRBC: 0 % (ref 0.0–0.2)

## 2024-02-22 MED ORDER — MORPHINE SULFATE (PF) 2 MG/ML IV SOLN
2.0000 mg | INTRAVENOUS | Status: DC | PRN
  Administered 2024-02-22 – 2024-02-23 (×6): 4 mg via INTRAVENOUS
  Administered 2024-02-23: 2 mg via INTRAVENOUS
  Filled 2024-02-22: qty 1
  Filled 2024-02-22 (×6): qty 2

## 2024-02-22 MED ORDER — SODIUM CHLORIDE 0.9 % IV BOLUS
500.0000 mL | Freq: Once | INTRAVENOUS | Status: AC
  Administered 2024-02-22: 500 mL via INTRAVENOUS

## 2024-02-22 MED ORDER — LACTATED RINGERS IV SOLN: INTRAVENOUS | Status: DC

## 2024-02-22 NOTE — Plan of Care (Signed)

## 2024-02-22 NOTE — Progress Notes (Addendum)
 Progress Note   Patient: Gary [name] HYQ:[id number] DOB: 05-10-1986 DOA: 02/19/2024     3 DOS: the patient was seen and examined on 02/22/2024   Brief hospital course: 37yo with h/o alcohol abuse, chronic pancreatitis with frequent hospitalizations for acute on chronic pancreatitis, HIV/AIDS, medication noncompliance, seizure disorder, bipolar schizophrenia, PTSD and homelessness who was previously admitted from 4/6-10 for acute on chronic alcoholic pancreatitis who presented with the same on 4/12 after resuming ETOH use shortly after discharge.  No recorded emesis since admission.  Assessment and Plan:  Acute on chronic pancreatitis in the setting of continued alcohol abuse Patient presenting with recurrent abdominal pain in the setting of continued alcohol use  Previously admitted last week with the same, drank again within 24 hours and returned with recurrent symptoms Discussion regarding need for complete abstinence/cessation of alcohol given his recurrent pancreatitis events, as this is the etiology to his multiple hospitalizations and ED visits Continue aggressive LR hydration Changed from Dilaudid to morphine and patient was VERY unhappy about this;  increased morphine range to 2-4 mg for moderate to severe pain and added Toradol for breakthrough pain on 4/14 Will space out morphine to q4h prn today at same dosing Advance diet to full liquids   Bipolar disorder/schizophrenia Seroquel 50 mg p.o. twice daily   HIV positive, asymptomatic Continue Biktarvy Outpatient follow-up with infectious disease   Alcohol intoxication Alcohol use disorder Patient with continued alcohol abuse, last drink day of admission, shortly after last dc Monitor on CIWA protocol  Counseled on complete alcohol cessation/abstinence TOC evaluation for possible halfway house program at time of dc   Tobacco use disorder Counseled on need for complete cessation Nicotine patch ordered          Consultants: None   Procedures: None   Antibiotics: None    30 Day Unplanned Readmission Risk Score    Flowsheet Row ED to Hosp-Admission (Current) from 02/19/2024 in [facility]  30 Day Unplanned Readmission Risk Score (%) 96.56 Filed at 02/22/2024 0801       This score is the patient's risk of an unplanned readmission within 30 days of being discharged (0 -100%). The score is based on dignosis, age, lab data, medications, orders, and past utilization.   Low:  0-14.9   Medium: 15-21.9   High: 22-29.9   Extreme: 30 and above           Subjective: He reports some improvement in pain but still terrible pain.  Also reports desire to advance his diet.   Objective: Vitals:   02/22/24 0610 02/22/24 1213  BP: 102/63 94/61  Pulse: 69 75  Resp:  16  Temp:  97.8 F (36.6 C)  SpO2:  100%    Intake/Output Summary (Last 24 hours) at 02/22/2024 1357 Last data filed at 02/22/2024 1100 Gross per 24 hour  Intake 240 ml  Output 1700 ml  Net -1460 ml   Filed Weights   02/19/24 0433 02/19/24 0435  Weight: 63.5 kg 62.6 kg    Exam:  General:  Appears calm and comfortable and is in NAD, frail, chronically ill Eyes:  EOMI, normal lids ENT:  grossly normal hearing, lips & tongue, mmm Cardiovascular:  RRR, no m/r/g. No LE edema.  Respiratory:   CTA bilaterally with no wheezes/rales/rhonchi.  Normal respiratory effort. Abdomen:  soft, diffuse but improved abdominal pain with voluntary guarding today, ND Skin:  no rash or induration seen on limited exam Musculoskeletal:  grossly normal tone  BUE/BLE, good ROM, no bony abnormality Psychiatric:  irritable mood and affect, speech fluent and appropriate, AOx3 Neurologic:  CN 2-12 grossly intact, moves all extremities in coordinated fashion  Data Reviewed: I have reviewed the patient's lab results since admission.  Pertinent labs for today include:   WBC 2.3 Hgb 9.6 Platelets 133      Family Communication: None present  Disposition: Status is: Inpatient Remains inpatient appropriate because: ongoing management     Time spent: 35 minutes  Unresulted Labs (From admission, onward)     Start     Ordered   02/23/24 0500  Basic metabolic panel with GFR  Tomorrow morning,   R        02/22/24 1357   02/21/24 0500  CBC with Differential/Platelet  Tomorrow morning,   R        02/20/24 1752             Author: Lorita Rosa, MD 02/22/2024 1:57 PM  For on call review [email or web address].

## 2024-02-23 DIAGNOSIS — Z789 Other specified health status: Secondary | ICD-10-CM | POA: Diagnosis not present

## 2024-02-23 DIAGNOSIS — B20 Human immunodeficiency virus [HIV] disease: Secondary | ICD-10-CM | POA: Diagnosis not present

## 2024-02-23 DIAGNOSIS — F1010 Alcohol abuse, uncomplicated: Secondary | ICD-10-CM | POA: Diagnosis not present

## 2024-02-23 DIAGNOSIS — K852 Alcohol induced acute pancreatitis without necrosis or infection: Secondary | ICD-10-CM | POA: Diagnosis not present

## 2024-02-23 LAB — BASIC METABOLIC PANEL WITH GFR
Anion gap: 7 (ref 5–15)
BUN: 6 mg/dL (ref 6–20)
CO2: 26 mmol/L (ref 22–32)
Calcium: 9 mg/dL (ref 8.9–10.3)
Chloride: 101 mmol/L (ref 98–111)
Creatinine, Ser: 0.55 mg/dL — ABNORMAL LOW (ref 0.61–1.24)
GFR, Estimated: >60 mL/min (ref >60)
Glucose, Bld: 101 mg/dL — ABNORMAL HIGH (ref 70–99)
Potassium: 4.2 mmol/L (ref 3.5–5.1)
Sodium: 134 mmol/L — ABNORMAL LOW (ref 135–145)

## 2024-02-23 MED ORDER — SIMETHICONE 80 MG PO CHEW
80.0000 mg | CHEWABLE_TABLET | Freq: Four times a day (QID) | ORAL | Status: DC | PRN
  Administered 2024-02-23: 80 mg via ORAL
  Filled 2024-02-23: qty 1

## 2024-02-23 MED ORDER — HEPARIN SODIUM (PORCINE) 5000 UNIT/ML IJ SOLN
5000.0000 [IU] | Freq: Three times a day (TID) | INTRAMUSCULAR | Status: DC
  Filled 2024-02-23: qty 1

## 2024-02-23 MED ORDER — MORPHINE SULFATE (PF) 2 MG/ML IV SOLN
2.0000 mg | INTRAVENOUS | Status: DC | PRN
  Administered 2024-02-23 – 2024-02-24 (×5): 2 mg via INTRAVENOUS
  Filled 2024-02-23 (×5): qty 1

## 2024-02-23 NOTE — Plan of Care (Signed)

## 2024-02-23 NOTE — Progress Notes (Signed)
 PROGRESS NOTE    Gary [name]  QIO:[id number] DOB: 26-Nov-1985 DOA: 02/19/2024 PCP: Patient, No Pcp Per  Chief Complaint  Patient presents with   Abdominal Pain   Pancreatitis    Hospital Course:  Gary [name] is 38 y.o. male with history of alcohol abuse, chronic pancreatitis, frequent hospitalizations due to acute on chronic pancreatitis, nonadherence to medication regimen, HIV/AIDS, seizure disorder, bipolar, schizophrenia, PTSD, homelessness.he was most recently admitted 4/6 through 4/10 for pancreatitis.  He then presented again 4/12 after resuming alcohol shortly after discharge.  Currently admitted for alcoholic pancreatitis  Subjective: This morning patient is complaining of abdominal bloating.  He believes he is forming an ileus.  He does endorse gas.  He is also tolerating his diet without issue.  We discussed the importance of decreasing his opioid usage in the setting of ileus.   Objective: Vitals:   02/22/24 1617 02/22/24 2025 02/23/24 0230 02/23/24 0414  BP: 93/60 117/63 102/62 101/67  Pulse: 77 78 80 71  Resp:      Temp: 98.1 F (36.7 C) 98.8 F (37.1 C)  98.8 F (37.1 C)  TempSrc: Oral Oral  Oral  SpO2: 98% 100%  100%  Weight:      Height:        Intake/Output Summary (Last 24 hours) at 02/23/2024 0810 Last data filed at 02/23/2024 0414 Gross per 24 hour  Intake --  Output 1200 ml  Net -1200 ml   Filed Weights   02/19/24 0433 02/19/24 0435  Weight: 63.5 kg 62.6 kg    Examination: General exam: Appears calm and comfortable, thin, chronically ill-appearing Respiratory system: No work of breathing, symmetric chest wall expansion Cardiovascular system: S1 & S2 heard, RRR.  Gastrointestinal system: Abdomen is nondistended, soft and nontender.  Bowel sounds x 4 quadrants. Neuro: Alert and oriented. No focal neurological deficits. Extremities: Symmetric, expected ROM Skin: No rashes, lesions Psychiatry: Demonstrates appropriate judgement and insight.  Mood & affect appropriate for situation.   Assessment & Plan:  Principal Problem:   Acute alcoholic pancreatitis    Acute on chronic pancreatitis in the setting of acute alcohol abuse - Recurrent admissions.  Continues to drink alcohol outpatient - Patient demonstrates pain seeking behaviors while admitted - Received extensive counseling regarding the importance of cessation from alcohol - Tolerating a clear liquid diet.  Discontinue further IV fluids - Currently on morphine.  Do not recommend changing to Dilaudid.  - As needed Toradol for breakthrough pain - Continue to taper morphine, Now 2mg  q4h - On liquid diet, given patient's concerns of developing ileus we will hold off on further advancement.  Appears to be tolerating liquids without issue.  No evidence of ileus on exam.  Monitor closely.  Bipolar disorder Schizophrenia - Continue Seroquel  HIV AIDS - Recent CD4 count January: 460 - Continue Biktarvy - Outpatient follow-up with infectious disease  Alcohol intoxication Alcohol use disorder - Extensive cessation counseling as above - Continue on CIWA protocol - TOC consulted for possible halfway house at discharge  Tobacco use disorder - Has also been counseled on cessation.  DVT prophylaxis: Heparin   Code Status: Full Code Family Communication:  Discussed directly with patient Disposition:  Inpatient still hospitalized for pain management and diet advancement, will discharge to home when stable. Hopefully tomorrow  Consultants:    Procedures:    Antimicrobials:  Anti-infectives (From admission, onward)    Start     Dose/Rate Route Frequency Ordered Stop   02/19/24 1000  bictegravir-emtricitabine-tenofovir AF (  BIKTARVY) 50-200-25 MG per tablet 1 tablet        1 tablet Oral Daily 02/19/24 0753         Data Reviewed: I have personally reviewed following labs and imaging studies CBC: Recent Labs  Lab 02/19/24 0502 02/20/24 0931 02/21/24 0738  02/22/24 0425  WBC 5.7 6.0 2.2* 2.3*  NEUTROABS 3.5  --  1.1* 1.5*  HGB 14.5 12.9* 10.0* 9.6*  HCT 42.7 38.9* 31.2* 29.8*  MCV 91.0 93.7 96.0 97.1  PLT 284 159 106* 133*   Basic Metabolic Panel: Recent Labs  Lab 02/17/24 0237 02/19/24 0502 02/20/24 0931 02/20/24 1909 02/21/24 0737 02/23/24 0421  NA 134* 137 130* 133* 134* 134*  K 3.9 3.4* 3.9 4.0 3.8 4.2  CL 104 94* 95* 97* 100 101  CO2 22 28 24 27 23 26   GLUCOSE 110* 104* 90 82 59* 101*  BUN 9 9 5* 6 11 6   CREATININE 0.65 0.71 0.43* 0.70 0.62 0.55*  CALCIUM 8.4* 10.6* 9.1 9.3 8.8* 9.0  MG 1.8  --   --   --   --   --    GFR: Estimated Creatinine Clearance: 111.9 mL/min (A) (by C-G formula based on SCr of 0.55 mg/dL (L)). Liver Function Tests: Recent Labs  Lab 02/19/24 0502 02/20/24 0931 02/20/24 1909 02/21/24 0737  AST 19 16 12* 11*  ALT 14 9 10 8   ALKPHOS 73 57 55 44  BILITOT 0.6 1.0 0.8 1.0  PROT 9.4* 7.2 6.8 6.0*  ALBUMIN 5.0 3.8 3.6 3.1*   CBG: Recent Labs  Lab 02/21/24 1129 02/21/24 1203 02/21/24 1832 02/21/24 1833 02/21/24 1858  GLUCAP 51* 147* 46* 55* 115*    No results found for this or any previous visit (from the past 240 hours).   Radiology Studies: No results found.  Scheduled Meds:  bictegravir-emtricitabine-tenofovir AF  1 tablet Oral Daily   folic acid  1 mg Oral Daily   multivitamin with minerals  1 tablet Oral Daily   nicotine  14 mg Transdermal Daily   QUEtiapine  50 mg Oral BID   thiamine  100 mg Oral Daily   Or   thiamine  100 mg Intravenous Daily   Continuous Infusions:  lactated ringers 125 mL/hr at 02/22/24 1512     LOS: 4 days  MDM: Patient is high risk for one or more organ failure.  They necessitate ongoing hospitalization for continued IV therapies and subsequent lab monitoring. Total time spent interpreting labs and vitals, coordinating care amongst consultants and care team members, directly assessing and discussing care with the patient and/or family: 55 min     Shiva Sahagian, DO [facility]  To contact the attending physician between 7A-7P please use Epic Chat. To contact the covering physician during after hours 7P-7A, please review Amion.   02/23/2024, 8:10 AM   *This document has been created with the assistance of dictation software. Please excuse typographical errors. *

## 2024-02-24 ENCOUNTER — Other Ambulatory Visit (HOSPITAL_COMMUNITY): Payer: Self-pay

## 2024-02-24 ENCOUNTER — Emergency Department (HOSPITAL_COMMUNITY)
Admission: EM | Admit: 2024-02-24 | Discharge: 2024-02-25 | Disposition: A | Discharge status: Home / Self Care | Payer: MEDICAID | Attending: Emergency Medicine | Admitting: Emergency Medicine

## 2024-02-24 DIAGNOSIS — Z21 Asymptomatic human immunodeficiency virus [HIV] infection status: Secondary | ICD-10-CM | POA: Insufficient documentation

## 2024-02-24 DIAGNOSIS — K852 Alcohol induced acute pancreatitis without necrosis or infection: Secondary | ICD-10-CM | POA: Diagnosis not present

## 2024-02-24 DIAGNOSIS — F17210 Nicotine dependence, cigarettes, uncomplicated: Secondary | ICD-10-CM | POA: Diagnosis not present

## 2024-02-24 DIAGNOSIS — R1010 Upper abdominal pain, unspecified: Secondary | ICD-10-CM | POA: Diagnosis present

## 2024-02-24 LAB — CBC WITH DIFFERENTIAL/PLATELET
Abs Immature Granulocytes: 0.01 10*3/uL (ref 0.00–0.07)
Basophils Absolute: 0 10*3/uL (ref 0.0–0.1)
Basophils Relative: 0 %
Eosinophils Absolute: 0 10*3/uL (ref 0.0–0.5)
Eosinophils Relative: 0 %
HCT: 38.4 % — ABNORMAL LOW (ref 39.0–52.0)
Hemoglobin: 13.3 g/dL (ref 13.0–17.0)
Immature Granulocytes: 0 %
Lymphocytes Relative: 14 %
Lymphs Abs: 0.8 10*3/uL (ref 0.7–4.0)
MCH: 31.3 pg (ref 26.0–34.0)
MCHC: 34.6 g/dL (ref 30.0–36.0)
MCV: 90.4 fL (ref 80.0–100.0)
Monocytes Absolute: 0.3 10*3/uL (ref 0.1–1.0)
Monocytes Relative: 5 %
Neutro Abs: 4.6 10*3/uL (ref 1.7–7.7)
Neutrophils Relative %: 81 %
Platelets: 265 10*3/uL (ref 150–400)
RBC: 4.25 MIL/uL (ref 4.22–5.81)
RDW: 13.9 % (ref 11.5–15.5)
WBC: 5.8 10*3/uL (ref 4.0–10.5)
nRBC: 0 % (ref 0.0–0.2)

## 2024-02-24 LAB — COMPREHENSIVE METABOLIC PANEL WITH GFR
ALT: 13 U/L (ref 0–44)
AST: 20 U/L (ref 15–41)
Albumin: 4.2 g/dL (ref 3.5–5.0)
Alkaline Phosphatase: 65 U/L (ref 38–126)
Anion gap: 17 — ABNORMAL HIGH (ref 5–15)
BUN: 8 mg/dL (ref 6–20)
CO2: 28 mmol/L (ref 22–32)
Calcium: 11.5 mg/dL — ABNORMAL HIGH (ref 8.9–10.3)
Chloride: 94 mmol/L — ABNORMAL LOW (ref 98–111)
Creatinine, Ser: 1.01 mg/dL (ref 0.61–1.24)
GFR, Estimated: >60 mL/min (ref >60)
Glucose, Bld: 124 mg/dL — ABNORMAL HIGH (ref 70–99)
Potassium: 4.2 mmol/L (ref 3.5–5.1)
Sodium: 139 mmol/L (ref 135–145)
Total Bilirubin: 0.8 mg/dL (ref 0.0–1.2)
Total Protein: 8.3 g/dL — ABNORMAL HIGH (ref 6.5–8.1)

## 2024-02-24 LAB — LIPASE, BLOOD: Lipase: 32 U/L (ref 11–51)

## 2024-02-24 MED ORDER — BICTEGRAVIR-EMTRICITAB-TENOFOV 50-200-25 MG PO TABS
1.0000 | ORAL_TABLET | Freq: Every day | ORAL | 5 refills | Status: DC
  Filled 2024-02-24: qty 30, 30d supply, fill #0
  Filled 2024-04-18: qty 30, 30d supply, fill #1
  Filled 2024-05-26: qty 30, 30d supply, fill #2

## 2024-02-24 MED ORDER — CYANOCOBALAMIN 1000 MCG PO TABS
1000.0000 ug | ORAL_TABLET | Freq: Every day | ORAL | 2 refills | Status: DC
  Filled 2024-02-24: qty 30, 30d supply, fill #0

## 2024-02-24 MED ORDER — VITAMIN B-1 100 MG PO TABS
100.0000 mg | ORAL_TABLET | Freq: Every day | ORAL | 0 refills | Status: DC
  Filled 2024-02-24: qty 30, 30d supply, fill #0

## 2024-02-24 MED ORDER — QUETIAPINE FUMARATE 50 MG PO TABS
50.0000 mg | ORAL_TABLET | Freq: Two times a day (BID) | ORAL | 3 refills | Status: DC
  Filled 2024-02-24: qty 60, 30d supply, fill #0
  Filled 2024-04-18: qty 60, 30d supply, fill #1

## 2024-02-24 MED ORDER — VITAMIN B-12 1000 MCG PO TABS
1000.0000 ug | ORAL_TABLET | Freq: Every day | ORAL | Status: DC
  Administered 2024-02-24: 1000 ug via ORAL
  Filled 2024-02-24: qty 1

## 2024-02-24 MED ORDER — ONDANSETRON 4 MG PO TBDP
4.0000 mg | ORAL_TABLET | Freq: Once | ORAL | Status: AC | PRN
  Administered 2024-02-24: 4 mg via ORAL
  Filled 2024-02-24: qty 1

## 2024-02-24 MED ORDER — ACETAMINOPHEN 325 MG PO TABS
650.0000 mg | ORAL_TABLET | Freq: Three times a day (TID) | ORAL | 0 refills | Status: AC | PRN
  Filled 2024-02-24: qty 30, 5d supply, fill #0

## 2024-02-24 MED ORDER — OXYCODONE HCL 5 MG PO TABS
5.0000 mg | ORAL_TABLET | Freq: Once | ORAL | Status: AC
  Administered 2024-02-24: 5 mg via ORAL
  Filled 2024-02-24: qty 1

## 2024-02-24 NOTE — Progress Notes (Signed)
Bus pass given to patient.

## 2024-02-24 NOTE — Discharge Summary (Signed)
 [facility] Discharge Summary   Patient: Gary [name]:[id number]  PCP: Patient, No Pcp Per  Date of admission: 02/19/2024   Date of discharge:  02/24/2024     Discharge Diagnoses:   Principal Problem:   Acute alcoholic pancreatitis   Admitted From: Home Disposition:  Home   Recommendations for Outpatient Follow-up:  PCP: in 1 wk ID in  1 wk Follow up LABS/TEST:     Follow-up Information     PCP Follow up in 1 week(s).                 Diet recommendation: low fiber soft diet, soft and easy to digest, small meals.  Activity: The patient is advised to gradually reintroduce usual activities, as tolerated  Discharge Condition: stable  Code Status: Full code   History of present illness: As per the H and P dictated on admission  Hospital Course:  Gary [name] is 38 y.o. male with history of alcohol abuse, chronic pancreatitis, frequent hospitalizations due to acute on chronic pancreatitis, nonadherence to medication regimen, HIV/AIDS, seizure disorder, bipolar, schizophrenia, PTSD, homelessness.he was most recently admitted 4/6 through 4/10 for pancreatitis.  He then presented again 4/12 after resuming alcohol shortly after discharge.  Currently admitted for alcoholic pancreatitis    Assessment & Plan:  Principal Problem:   Acute alcoholic pancreatitis    # Acute on chronic pancreatitis in the setting of acute alcohol abuse Recurrent admissions.  Continues to drink alcohol outpatient Patient demonstrates pain seeking behaviors while admitted Received extensive counseling regarding the importance of cessation from alcohol Tolerating a clear liquid diet.  Discontinue further IV fluids S/p morphine.  Do not recommend changing to Dilaudid.  S/p prn Toradol for breakthrough pain S/p taper morphine, Now 2mg  q4h On liquid diet, given patient's concerns of developing ileus we will hold off on further advancement.  Appears to be tolerating liquids without  issue.  No evidence of ileus on exam.  Monitor closely. Patient is tolerating diet well.  Advance to soft diet.  Patient agreed for the discharge planning today.    # Bipolar disorder and Schizophrenia: Continue Seroquel   # HIV and AIDS Recent CD4 count January: 460 Continue Biktarvy Outpatient follow-up with infectious disease   # Alcohol intoxication # Alcohol use disorder Extensive cessation counseling as above S/p CIWA protocol.  Continue thiamine 100 mg p.o. daily for 30 days TOC consulted for possible halfway house at discharge   # Tobacco use disorder: Smoking cessation counseling done  # Vitamin B12 level 267, goal >400.  Started vitamin B12 1000 mcg p.o. daily.  Follow with the PCP to repeat B12 level after 3 to 6 months.   Body mass index is 18.72 kg/m.  Nutrition Interventions:  - Patient was instructed, not to drive, operate heavy machinery, perform activities at heights, swimming or participation in water activities or provide baby sitting services while on Pain, Sleep and Anxiety Medications; until his outpatient Physician has advised to do so again.  - Also recommended to not to take more than prescribed Pain, Sleep and Anxiety Medications.  Patient was ambulatory without any assistance. On the day of the discharge the patient's vitals were stable, and no other acute medical condition were reported by patient. the patient was felt safe to be discharge at Home.  Consultants: None Procedures: None  Discharge Exam: General: Appear in no distress, no Rash; Oral Mucosa Clear, moist. Cardiovascular: S1 and S2 Present, no Murmur, Respiratory: normal respiratory effort, Bilateral Air entry  present and no Crackles, no wheezes Abdomen: Bowel Sound present, Soft and mild epigastric tenderness Extremities: no Pedal edema, no calf tenderness Neurology: alert and oriented to time, place, and person affect appropriate.  Filed Weights   02/19/24 0433 02/19/24 0435   Weight: 63.5 kg 62.6 kg   Vitals:   02/23/24 2047 02/24/24 0615  BP: 119/82 101/67  Pulse: 65 65  Resp: 18 16  Temp: 99 F (37.2 C) 98.5 F (36.9 C)  SpO2: 99% 97%    DISCHARGE MEDICATION: Allergies as of 02/24/2024       Reactions   Tegretol [carbamazepine] Other (See Comments)   Vertigo  Paralysis   Caffeine Palpitations        Medication List     TAKE these medications    acetaminophen 325 MG tablet Commonly known as: TYLENOL Take 2 tablets (650 mg total) by mouth 3 (three) times daily as needed for up to 10 days for mild pain (pain score 1-3), moderate pain (pain score 4-6), headache or fever.   bictegravir-emtricitabine-tenofovir AF 50-200-25 MG Tabs tablet Commonly known as: BIKTARVY Take 1 tablet by mouth daily.   cyanocobalamin 1000 MCG tablet Take 1 tablet (1,000 mcg total) by mouth daily. Start taking on: February 25, 2024   QUEtiapine 50 MG tablet Commonly known as: SEROQUEL Take 1 tablet (50 mg total) by mouth 2 (two) times daily. What changed:  how much to take when to take this additional instructions   thiamine 100 MG tablet Commonly known as: VITAMIN B1 Take 1 tablet (100 mg total) by mouth daily. Start taking on: February 25, 2024       Allergies  Allergen Reactions   Tegretol [Carbamazepine] Other (See Comments)    Vertigo  Paralysis   Caffeine Palpitations   Discharge Instructions     Call MD for:  difficulty breathing, headache or visual disturbances   Complete by: As directed    Call MD for:  extreme fatigue   Complete by: As directed    Call MD for:  persistant dizziness or light-headedness   Complete by: As directed    Call MD for:  persistant nausea and vomiting   Complete by: As directed    Call MD for:  severe uncontrolled pain   Complete by: As directed    Call MD for:  temperature >100.4   Complete by: As directed    Diet general   Complete by: As directed    Soft diet and small portion of meals   Discharge  instructions   Complete by: As directed    F/u with PCP in 1 wk F/u with ID for HIV treatment   Increase activity slowly   Complete by: As directed        The results of significant diagnostics from this hospitalization (including imaging, microbiology, ancillary and laboratory) are listed below for reference.    Significant Diagnostic Studies: DG Chest Portable 1 View Result Date: 02/13/2024 CLINICAL DATA:  Chest pain. EXAM: PORTABLE CHEST 1 VIEW COMPARISON:  Chest radiograph dated 11/25/2023. FINDINGS: Similar rotation to the left. The heart size and mediastinal contours are unchanged. Similar mild elevation of the left hemidiaphragm. No focal consolidation, pleural effusion, or pneumothorax. No acute osseous abnormality. IMPRESSION: No acute cardiopulmonary findings. Electronically Signed   By: Hart Robinsons M.D.   On: 02/13/2024 10:38   US LIVER DOPPLER Result Date: 02/04/2024 CLINICAL DATA:  Portal vein thrombosis EXAM: DUPLEX ULTRASOUND OF LIVER TECHNIQUE: Color and duplex Doppler ultrasound was performed to  evaluate the hepatic in-flow and out-flow vessels. COMPARISON:  CT from previous day FINDINGS: Liver: Normal parenchymal echogenicity. Normal hepatic contour without nodularity. No focal lesion, mass or intrahepatic biliary ductal dilatation. Main Portal Vein size: 2.3 cm Superior mesenteric vein not visualized. Portal Vein Velocities (all hepatopetal): Main Prox:  86 cm/sec Main Mid: 92 cm/sec Main Dist:  89 cm/sec Right: 25 cm/sec Left: 29 cm/sec Hepatic Vein Velocities (all hepatofugal): Right:  13 cm/sec Middle:  15 cm/sec Left:  22 cm/sec IVC: Present and patent with normal respiratory phasicity. Velocity 16 cm/sec. Hepatic Artery Velocity:  Not identified Splenic Vein Velocity:  23 cm/sec Spleen: 13.5 cm x 13.5 cm x 4.4 cm with a total volume of 420 cm^3 (411 cm^3 is upper limit normal) Portal Vein Occlusion/Thrombus: No Splenic Vein Occlusion/Thrombus: No Ascites: Trace Varices:  None identified IMPRESSION: 1. Patent portal vein with normal directional flow. 2. No hepatic vein thrombosis. 3. Trace ascites. 4. Mild splenomegaly. Electronically Signed   By: Corlis Leak M.D.   On: 02/04/2024 07:47   CT HEAD WO CONTRAST ( ) Result Date: 02/03/2024 CLINICAL DATA:  Altered mental status EXAM: CT HEAD WITHOUT CONTRAST TECHNIQUE: Contiguous axial images were obtained from the base of the skull through the vertex without intravenous contrast. RADIATION DOSE REDUCTION: This exam was performed according to the departmental dose-optimization program which includes automated exposure control, adjustment of the mA and/or kV according to patient size and/or use of iterative reconstruction technique. COMPARISON:  09/16/2023 FINDINGS: Brain: No evidence of acute infarction, hemorrhage, hydrocephalus, extra-axial collection or mass lesion/mass effect. Vascular: No hyperdense vessel or unexpected calcification. Skull: Normal. Negative for fracture or focal lesion. Sinuses/Orbits: No acute finding. Other: None. IMPRESSION: No acute intracranial abnormality noted. Electronically Signed   By: Alcide Clever M.D.   On: 02/03/2024 02:52   CT ABDOMEN PELVIS W CONTRAST Result Date: 02/03/2024 CLINICAL DATA:  Pancreatitis, acute, severe EXAM: CT ABDOMEN AND PELVIS WITH CONTRAST TECHNIQUE: Multidetector CT imaging of the abdomen and pelvis was performed using the standard protocol following bolus administration of intravenous contrast. RADIATION DOSE REDUCTION: This exam was performed according to the departmental dose-optimization program which includes automated exposure control, adjustment of the mA and/or kV according to patient size and/or use of iterative reconstruction technique. CONTRAST:  75mL OMNIPAQUE IOHEXOL 350 MG/ML SOLN COMPARISON:  CT abdomen pelvis 12/17/2023, CT abdomen pelvis 11/25/2023, CT abdomen pelvis 10/28/2023, CT abdomen pelvis 08/22/2023, CT abdomen pelvis 05/24/2023 FINDINGS: Lower  chest: No acute abnormality. Hepatobiliary: No focal liver abnormality. No gallstones, gallbladder wall thickening, or pericholecystic fluid. No biliary dilatation. Pancreas: Grossly stable 1.6 cm proximal pancreas fluid dense lesion (3:24). Normal pancreatic contour. Interval increase in proximal pancreas peripancreatic free fluid with mass effect on the proximal portal vein with focal marked stenosis (3:22). No main pancreatic ductal dilatation. Spleen: Normal in size without focal abnormality. Adrenals/Urinary Tract: No adrenal nodule bilaterally. Bilateral kidneys enhance symmetrically. No hydronephrosis. No hydroureter. The urinary bladder is unremarkable. Stomach/Bowel: Stomach is within normal limits. Small bowel loops within the lower anterior abdomen dilated with air and fluid measuring up to 3.7 cm in caliber. No definite transition point identified. No evidence of large bowel wall thickening or dilatation. Appendix appears normal. Vascular/Lymphatic: No abdominal aorta or iliac aneurysm. No abdominal, pelvic, or inguinal lymphadenopathy. Reproductive: Prostate enlarged measuring 4.9 cm. Other: Upper abdomen simple free fluid. No intraperitoneal free gas. No organized fluid collection. Musculoskeletal: No abdominal wall hernia or abnormality. No suspicious lytic or blastic osseous lesions. No acute displaced  fracture. IMPRESSION: 1. Interval worsening acute pancreatitis. 2. 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 Underlying thrombus not excluded. 3. Small bowel loops within the lower anterior abdomen dilated with air and fluid measuring up to 3.7 cm in caliber. No definite transition point identified. Finding could represent ileus versus partial small bowel obstruction. 4. Prostatomegaly. 5.  Aortic Atherosclerosis (ICD10-I70.0). Electronically Signed   By: Morgane  Naveau M.D.   On: 02/03/2024 00:00     Microbiology: No results found for this or any previous visit (from the past 240 hours).   Labs: CBC: Recent Labs  Lab 02/19/24 0502 02/20/24 0931 02/21/24 0738 02/22/24 0425  WBC 5.7 6.0 2.2* 2.3*  NEUTROABS 3.5  --  1.1* 1.5*  HGB 14.5 12.9* 10.0* 9.6*  HCT 42.7 38.9* 31.2* 29.8*  MCV 91.0 93.7 96.0 97.1  PLT 284 159 106* 133*   Basic Metabolic Panel: Recent Labs  Lab 02/19/24 0502 02/20/24 0931 02/20/24 1909 02/21/24 0737 02/23/24 0421  NA 137 130* 133* 134* 134*  K 3.4* 3.9 4.0 3.8 4.2  CL 94* 95* 97* 100 101  CO2 28 24 27 23 26   GLUCOSE 104* 90 82 59* 101*  BUN 9 5* 6 11 6   CREATININE 0.71 0.43* 0.70 0.62 0.55*  CALCIUM 10.6* 9.1 9.3 8.8* 9.0   Liver Function Tests: Recent Labs  Lab 02/19/24 0502 02/20/24 0931 02/20/24 1909 02/21/24 0737  AST 19 16 12* 11*  ALT 14 9 10 8   ALKPHOS 73 57 55 44  BILITOT 0.6 1.0 0.8 1.0  PROT 9.4* 7.2 6.8 6.0*  ALBUMIN 5.0 3.8 3.6 3.1*   Recent Labs  Lab 02/19/24 0502 02/20/24 0931  LIPASE 2,790* 329*   No results for input(s): "AMMONIA" in the last 168 hours. Cardiac Enzymes: No results for input(s): "CKTOTAL", "CKMB", "CKMBINDEX", "TROPONINI" in the last 168 hours. BNP (last 3 results) Recent Labs    12/08/23 0452  BNP 78.3   CBG: Recent Labs  Lab 02/21/24 1129 02/21/24 1203 02/21/24 1832 02/21/24 1833 02/21/24 1858  GLUCAP 51* 147* 46* 55* 115*    Time spent: 35 minutes  Signed:  Althia Atlas  [facility]  02/24/2024 11:40 AM

## 2024-02-24 NOTE — ED Triage Notes (Signed)
 Pt arrives via [facility] c/o abd pain and N/V. Pt was discharged today from [facility] for pancreatitis. He states that he resumed drinking alcohol after discharge and then had N/V.

## 2024-02-24 NOTE — Progress Notes (Signed)
 TOC meds in a secure bag delivered by this  RN to pt in room. AVS reviewed w/ pt who verbalized an understanding. No other questions at this time.Pt requested a bus pass at discharge. Pt dressed for d/c.

## 2024-02-24 NOTE — TOC Initial Note (Signed)
 Transition of Care [facility]) - Initial/Assessment Note    Patient Details  Name: Gary [name] MRN: [id number] Date of Birth: 08/28/1986  [facility]) CM/SW Contact:    Jessie Foot, RN Phone Number: 02/24/2024, 9:18 AM  Clinical Narrative:                 NCM spoke with patient in the room. Presented for pancreatitis. Pt states he is homeless and has stated that he stays on the street. Pt does not have a PCP and he does not take medications as prescribed. Pt does have a managed Medicaid plan and has coverage for care and for medications. Social Services, substance abuse Publishing copy added to pt's AVS. Patient states he does not have a relationship with brother Treyden Hakim) or his mother Kalvin Buss) and states he does not have a telephone number for either one.  Patient is not a candidate for inpatient substance abuse since he does not take his HIV medications. Not able to schedule appoint with PCP since patient does not have an address or telephone. Pt will need a bus pass at discharge. TOC signing off.   Expected Discharge Plan: Homeless Shelter Barriers to Discharge: Continued Medical Work up   Patient Goals and CMS Choice Patient states their goals for this hospitalization and ongoing recovery are:: Homeless          Expected Discharge Plan and Services   Discharge Planning Services: CM Consult   Living arrangements for the past 2 months: Homeless                 DME Arranged: N/A DME Agency: NA         HH Agency: NA        Prior Living Arrangements/Services Living arrangements for the past 2 months: Homeless   Patient language and need for interpreter reviewed:: Yes Do you feel safe going back to the place where you live?: No   Homeless does not feel safe going to local shelters  Need for Family Participation in Patient Care: Yes (Comment) (No relationship with family) Care giver support system in  place?: No (comment) Current home services:  (NA) Criminal Activity/Legal Involvement Pertinent to Current Situation/Hospitalization: No - Comment as needed  Activities of Daily Living   ADL Screening (condition at time of admission) Independently performs ADLs?: Yes (appropriate for developmental age) Is the patient deaf or have difficulty hearing?: No Does the patient have difficulty seeing, even when wearing glasses/contacts?: No Does the patient have difficulty concentrating, remembering, or making decisions?: No  Permission Sought/Granted Permission sought to share information with : Case Manager Permission granted to share information with : Yes, Verbal Permission Granted              Emotional Assessment Appearance:: Appears older than stated age Attitude/Demeanor/Rapport: Engaged Affect (typically observed): Appropriate Orientation: : Oriented to Self, Oriented to Place, Oriented to  Time, Oriented to Situation Alcohol / Substance Use: Alcohol Use Psych Involvement: No (comment)  Admission diagnosis:  Acute alcoholic pancreatitis [K85.20] Acute on chronic pancreatitis (HCC) [K85.90, K86.1] Patient Active Problem List   Diagnosis Date Noted   Alcohol induced acute pancreatitis 02/14/2024   Pancreatitis 02/13/2024   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/2024   Ileus (HCC) 12/25/2023   PTSD (post-traumatic stress disorder) 12/25/2023   Anxiety state 12/20/2023   Acute pancreatitis 12/17/2023   Acute alcoholic pancreatitis 12/10/2023   Acute on chronic anemia 12/05/2023   Alcohol withdrawal (  HCC) 11/26/2023   Seizure (HCC) 11/19/2023   Gastritis and duodenitis 11/19/2023   Chronic pancreatitis (HCC) 10/16/2023   Alcoholic fatty liver 09/25/2023   Seizure disorder (HCC) 12/17/2022   Pancytopenia (HCC) 12/14/2022   Alcohol-induced pancreatitis 12/14/2022   Delirium tremens (HCC) 10/19/2022   Alcohol dependence with withdrawal (HCC) 10/16/2022   Homelessness 05/28/2022    Constipation 07/30/2021   Malingering 05/15/2020   Alcohol use disorder, severe, dependence (HCC) 04/16/2019   HIV disease (HCC) 06/16/2018   Polysubstance abuse (HCC) 06/16/2018   Nicotine dependence, cigarettes, uncomplicated 06/16/2018   PCP:  Patient, No Pcp Per Pharmacy:   [facility] - [facility] [address] [place] [address] Phone: [phone] Fax: [phone]  [facility]) [phone] - [place], [place] - [address] AT [facility] [address] [place] [address] Phone: [phone] Fax: [phone]  [id number] [place], [place] - [address] AT [facility] [address] & [address] [place] [address] Phone: [phone] Fax: [phone]     Social Drivers of Health (SDOH) Social History: SDOH Screenings   Food Insecurity: Food Insecurity Present (02/19/2024)  Housing: High Risk (02/19/2024)  Transportation Needs: Unmet Transportation Needs (02/19/2024)  Utilities: Not At Risk (02/19/2024)  Alcohol Screen: High Risk (07/25/2021)  Depression (PHQ2-9): Medium Risk (01/28/2024)  Financial Resource Strain: Patient Declined (10/11/2023)  Social Connections: Socially Isolated (02/03/2024)  Stress: No Stress Concern Present (06/30/2022)   Received from [facility]  Recent Concern: Stress - Stress Concern Present (06/25/2022)   Received from [facility]  Tobacco Use: High Risk (02/19/2024)   SDOH Interventions:     Readmission Risk Interventions    02/14/2024    3:10 PM 12/15/2023   11:23 AM 11/29/2023   10:27 AM  Readmission Risk Prevention Plan  Transportation Screening Complete Complete Complete  Medication Review Oceanographer) Complete Complete Complete  PCP or Specialist appointment within 3-5 days of discharge Patient refused Complete   HRI or Home Care Consult Complete Complete Complete  SW Recovery Care/Counseling Consult Complete Complete  Complete  Palliative Care Screening Not Applicable Not Applicable Not Applicable  Skilled Nursing Facility Not Applicable Not Applicable Not Applicable

## 2024-02-24 NOTE — ED Provider Triage Note (Addendum)
 Emergency Medicine Provider Triage Evaluation Note  Gary [name] , a 38 y.o. male  was evaluated in triage.  Pt complains of epig pain  Hx pancreatitis, was discharged earlier today from [facility], went home and had some wine. Worsening epig pain since then. Vomited multiple times, no bloody/bilious.   Review of Systems  Positive: Epig pain Negative: fever  Physical Exam  BP 124/76 (BP Location: Right Arm)   Pulse (!) 112   Temp 98.2 F (36.8 C) (Oral)   Resp 16   SpO2 100%  Gen:   Awake, no distress   Resp:  Normal effort  MSK:   Moves extremities without difficulty  Other:  Mild ttp epig  Medical Decision Making  Medically screening exam initiated at 7:50 PM.  Appropriate orders placed.  ZAKAR BROSCH was informed that the remainder of the evaluation will be completed by another provider, this initial triage assessment does not replace that evaluation, and the importance of remaining in the ED until their evaluation is complete.     Teddi Favors, DO 02/24/24 1951    Teddi Favors, DO 02/24/24 1952

## 2024-02-25 ENCOUNTER — Inpatient Hospital Stay
Admission: EM | Admit: 2024-02-25 | Discharge: 2024-02-28 | DRG: 896 | Disposition: A | Discharge status: Home / Self Care | Payer: MEDICAID | Attending: Family Medicine | Admitting: Family Medicine

## 2024-02-25 ENCOUNTER — Other Ambulatory Visit: Payer: Self-pay

## 2024-02-25 DIAGNOSIS — F332 Major depressive disorder, recurrent severe without psychotic features: Secondary | ICD-10-CM | POA: Diagnosis not present

## 2024-02-25 DIAGNOSIS — F1910 Other psychoactive substance abuse, uncomplicated: Secondary | ICD-10-CM | POA: Diagnosis not present

## 2024-02-25 DIAGNOSIS — F4310 Post-traumatic stress disorder, unspecified: Secondary | ICD-10-CM | POA: Diagnosis present

## 2024-02-25 DIAGNOSIS — F17210 Nicotine dependence, cigarettes, uncomplicated: Secondary | ICD-10-CM | POA: Diagnosis present

## 2024-02-25 DIAGNOSIS — Z79899 Other long term (current) drug therapy: Secondary | ICD-10-CM

## 2024-02-25 DIAGNOSIS — Z681 Body mass index (BMI) 19 or less, adult: Secondary | ICD-10-CM

## 2024-02-25 DIAGNOSIS — K2970 Gastritis, unspecified, without bleeding: Secondary | ICD-10-CM | POA: Diagnosis present

## 2024-02-25 DIAGNOSIS — F10230 Alcohol dependence with withdrawal, uncomplicated: Principal | ICD-10-CM

## 2024-02-25 DIAGNOSIS — R54 Age-related physical debility: Secondary | ICD-10-CM | POA: Diagnosis present

## 2024-02-25 DIAGNOSIS — K860 Alcohol-induced chronic pancreatitis: Secondary | ICD-10-CM

## 2024-02-25 DIAGNOSIS — K5900 Constipation, unspecified: Secondary | ICD-10-CM | POA: Diagnosis present

## 2024-02-25 DIAGNOSIS — Z21 Asymptomatic human immunodeficiency virus [HIV] infection status: Secondary | ICD-10-CM | POA: Diagnosis present

## 2024-02-25 DIAGNOSIS — K861 Other chronic pancreatitis: Secondary | ICD-10-CM | POA: Diagnosis present

## 2024-02-25 DIAGNOSIS — E43 Unspecified severe protein-calorie malnutrition: Secondary | ICD-10-CM | POA: Diagnosis present

## 2024-02-25 DIAGNOSIS — Z811 Family history of alcohol abuse and dependence: Secondary | ICD-10-CM

## 2024-02-25 DIAGNOSIS — F3181 Bipolar II disorder: Secondary | ICD-10-CM | POA: Diagnosis present

## 2024-02-25 DIAGNOSIS — F10939 Alcohol use, unspecified with withdrawal, unspecified: Secondary | ICD-10-CM | POA: Insufficient documentation

## 2024-02-25 DIAGNOSIS — R45851 Suicidal ideations: Secondary | ICD-10-CM | POA: Diagnosis present

## 2024-02-25 DIAGNOSIS — F209 Schizophrenia, unspecified: Secondary | ICD-10-CM | POA: Diagnosis present

## 2024-02-25 DIAGNOSIS — F411 Generalized anxiety disorder: Secondary | ICD-10-CM | POA: Diagnosis present

## 2024-02-25 DIAGNOSIS — F1124 Opioid dependence with opioid-induced mood disorder: Secondary | ICD-10-CM | POA: Diagnosis present

## 2024-02-25 DIAGNOSIS — Z8249 Family history of ischemic heart disease and other diseases of the circulatory system: Secondary | ICD-10-CM | POA: Diagnosis not present

## 2024-02-25 DIAGNOSIS — Z9102 Food additives allergy status: Secondary | ICD-10-CM

## 2024-02-25 DIAGNOSIS — Z91148 Patient's other noncompliance with medication regimen for other reason: Secondary | ICD-10-CM | POA: Diagnosis not present

## 2024-02-25 DIAGNOSIS — E871 Hypo-osmolality and hyponatremia: Secondary | ICD-10-CM | POA: Diagnosis present

## 2024-02-25 DIAGNOSIS — B20 Human immunodeficiency virus [HIV] disease: Secondary | ICD-10-CM | POA: Diagnosis present

## 2024-02-25 DIAGNOSIS — F10229 Alcohol dependence with intoxication, unspecified: Secondary | ICD-10-CM | POA: Diagnosis present

## 2024-02-25 DIAGNOSIS — E88A Wasting disease (syndrome) due to underlying condition: Secondary | ICD-10-CM | POA: Diagnosis present

## 2024-02-25 DIAGNOSIS — K2990 Gastroduodenitis, unspecified, without bleeding: Secondary | ICD-10-CM | POA: Diagnosis not present

## 2024-02-25 DIAGNOSIS — Z888 Allergy status to other drugs, medicaments and biological substances status: Secondary | ICD-10-CM

## 2024-02-25 DIAGNOSIS — Z6372 Alcoholism and drug addiction in family: Secondary | ICD-10-CM

## 2024-02-25 DIAGNOSIS — K700 Alcoholic fatty liver: Secondary | ICD-10-CM | POA: Diagnosis present

## 2024-02-25 DIAGNOSIS — F10239 Alcohol dependence with withdrawal, unspecified: Secondary | ICD-10-CM | POA: Diagnosis present

## 2024-02-25 DIAGNOSIS — R45850 Homicidal ideations: Secondary | ICD-10-CM | POA: Diagnosis present

## 2024-02-25 DIAGNOSIS — F10930 Alcohol use, unspecified with withdrawal, uncomplicated: Principal | ICD-10-CM

## 2024-02-25 DIAGNOSIS — E860 Dehydration: Secondary | ICD-10-CM | POA: Diagnosis present

## 2024-02-25 DIAGNOSIS — K852 Alcohol induced acute pancreatitis without necrosis or infection: Secondary | ICD-10-CM | POA: Diagnosis not present

## 2024-02-25 DIAGNOSIS — K2980 Duodenitis without bleeding: Secondary | ICD-10-CM | POA: Diagnosis present

## 2024-02-25 DIAGNOSIS — Z765 Malingerer [conscious simulation]: Secondary | ICD-10-CM

## 2024-02-25 LAB — CBC
HCT: UNDETERMINED % (ref 39.0–52.0)
Hemoglobin: UNDETERMINED g/dL (ref 13.0–17.0)
MCH: UNDETERMINED pg (ref 26.0–34.0)
MCHC: UNDETERMINED g/dL (ref 30.0–36.0)
MCV: UNDETERMINED fL (ref 80.0–100.0)
Platelets: UNDETERMINED 10*3/uL (ref 150–400)
RBC: UNDETERMINED MIL/uL (ref 4.22–5.81)
RDW: UNDETERMINED % (ref 11.5–15.5)
WBC: UNDETERMINED 10*3/uL (ref 4.0–10.5)
nRBC: UNDETERMINED % (ref 0.0–0.2)

## 2024-02-25 LAB — URINALYSIS, ROUTINE W REFLEX MICROSCOPIC
Bilirubin Urine: NEGATIVE
Glucose, UA: NEGATIVE mg/dL
Hgb urine dipstick: NEGATIVE
Ketones, ur: 5 mg/dL — AB
Leukocytes,Ua: NEGATIVE
Nitrite: NEGATIVE
Protein, ur: 30 mg/dL — AB
Specific Gravity, Urine: 1.026 (ref 1.005–1.030)
Squamous Epithelial / HPF: 0 /HPF (ref 0–5)
pH: 7 (ref 5.0–8.0)

## 2024-02-25 LAB — CBC WITH DIFFERENTIAL/PLATELET
Abs Immature Granulocytes: 0.01 10*3/uL (ref 0.00–0.07)
Basophils Absolute: 0 10*3/uL (ref 0.0–0.1)
Basophils Relative: 1 %
Eosinophils Absolute: 0 10*3/uL (ref 0.0–0.5)
Eosinophils Relative: 1 %
HCT: 33 % — ABNORMAL LOW (ref 39.0–52.0)
Hemoglobin: 10.5 g/dL — ABNORMAL LOW (ref 13.0–17.0)
Immature Granulocytes: 0 %
Lymphocytes Relative: 25 %
Lymphs Abs: 0.9 10*3/uL (ref 0.7–4.0)
MCH: 31.2 pg (ref 26.0–34.0)
MCHC: 31.8 g/dL (ref 30.0–36.0)
MCV: 97.9 fL (ref 80.0–100.0)
Monocytes Absolute: 0.4 10*3/uL (ref 0.1–1.0)
Monocytes Relative: 11 %
Neutro Abs: 2.4 10*3/uL (ref 1.7–7.7)
Neutrophils Relative %: 62 %
Platelets: 210 10*3/uL (ref 150–400)
RBC: 3.37 MIL/uL — ABNORMAL LOW (ref 4.22–5.81)
RDW: 14 % (ref 11.5–15.5)
WBC: 3.8 10*3/uL — ABNORMAL LOW (ref 4.0–10.5)
nRBC: 0 % (ref 0.0–0.2)

## 2024-02-25 LAB — COMPREHENSIVE METABOLIC PANEL WITH GFR
ALT: 13 U/L (ref 0–44)
AST: 19 U/L (ref 15–41)
Albumin: 4.1 g/dL (ref 3.5–5.0)
Alkaline Phosphatase: 54 U/L (ref 38–126)
Anion gap: 11 (ref 5–15)
BUN: 15 mg/dL (ref 6–20)
CO2: 22 mmol/L (ref 22–32)
Calcium: 9 mg/dL (ref 8.9–10.3)
Chloride: 100 mmol/L (ref 98–111)
Creatinine, Ser: 0.89 mg/dL (ref 0.61–1.24)
GFR, Estimated: >60 mL/min (ref >60)
Glucose, Bld: 99 mg/dL (ref 70–99)
Potassium: 3.7 mmol/L (ref 3.5–5.1)
Sodium: 133 mmol/L — ABNORMAL LOW (ref 135–145)
Total Bilirubin: 0.9 mg/dL (ref 0.0–1.2)
Total Protein: 7.7 g/dL (ref 6.5–8.1)

## 2024-02-25 LAB — RAPID URINE DRUG SCREEN, HOSP PERFORMED
Amphetamines: NOT DETECTED
Barbiturates: NOT DETECTED
Benzodiazepines: NOT DETECTED
Cocaine: NOT DETECTED
Opiates: POSITIVE — AB
Tetrahydrocannabinol: NOT DETECTED

## 2024-02-25 LAB — PHOSPHORUS: Phosphorus: 3.7 mg/dL (ref 2.5–4.6)

## 2024-02-25 LAB — MAGNESIUM: Magnesium: 2.2 mg/dL (ref 1.7–2.4)

## 2024-02-25 LAB — LIPASE, BLOOD: Lipase: 33 U/L (ref 11–51)

## 2024-02-25 LAB — ETHANOL: Alcohol, Ethyl (B): <10 mg/dL (ref <10)

## 2024-02-25 MED ORDER — ACETAMINOPHEN 325 MG PO TABS
650.0000 mg | ORAL_TABLET | Freq: Four times a day (QID) | ORAL | Status: DC | PRN
  Filled 2024-02-25: qty 2

## 2024-02-25 MED ORDER — THIAMINE MONONITRATE 100 MG PO TABS
100.0000 mg | ORAL_TABLET | Freq: Every day | ORAL | Status: DC
  Administered 2024-02-25 – 2024-02-28 (×4): 100 mg via ORAL
  Filled 2024-02-25 (×4): qty 1

## 2024-02-25 MED ORDER — HYDROMORPHONE HCL 1 MG/ML IJ SOLN
0.5000 mg | INTRAMUSCULAR | Status: DC | PRN
  Administered 2024-02-25: 0.5 mg via INTRAVENOUS
  Filled 2024-02-25: qty 0.5

## 2024-02-25 MED ORDER — ADULT MULTIVITAMIN W/MINERALS CH
1.0000 | ORAL_TABLET | Freq: Every day | ORAL | Status: DC
  Administered 2024-02-25 – 2024-02-27 (×3): 1 via ORAL
  Filled 2024-02-25 (×3): qty 1

## 2024-02-25 MED ORDER — HYDROMORPHONE HCL 1 MG/ML IJ SOLN
1.0000 mg | Freq: Once | INTRAMUSCULAR | Status: AC
  Administered 2024-02-25: 1 mg via INTRAVENOUS
  Filled 2024-02-25: qty 1

## 2024-02-25 MED ORDER — ONDANSETRON HCL 4 MG PO TABS: 4.0000 mg | ORAL_TABLET | Freq: Four times a day (QID) | ORAL | Status: DC | PRN

## 2024-02-25 MED ORDER — ACETAMINOPHEN 650 MG RE SUPP: 650.0000 mg | Freq: Four times a day (QID) | RECTAL | Status: DC | PRN

## 2024-02-25 MED ORDER — LACTATED RINGERS IV SOLN: INTRAVENOUS | Status: AC

## 2024-02-25 MED ORDER — SENNOSIDES-DOCUSATE SODIUM 8.6-50 MG PO TABS: 1.0000 | ORAL_TABLET | Freq: Every evening | ORAL | Status: DC | PRN

## 2024-02-25 MED ORDER — ENSURE ENLIVE PO LIQD
237.0000 mL | Freq: Two times a day (BID) | ORAL | Status: DC
  Administered 2024-02-25 – 2024-02-28 (×4): 237 mL via ORAL

## 2024-02-25 MED ORDER — LORAZEPAM 2 MG/ML IJ SOLN
1.0000 mg | INTRAMUSCULAR | Status: DC | PRN
  Administered 2024-02-25: 2 mg via INTRAVENOUS
  Administered 2024-02-25: 1 mg via INTRAVENOUS
  Administered 2024-02-26: 2 mg via INTRAVENOUS
  Administered 2024-02-26: 1 mg via INTRAVENOUS
  Filled 2024-02-25 (×4): qty 1

## 2024-02-25 MED ORDER — SODIUM CHLORIDE 0.9 % IV BOLUS
1000.0000 mL | Freq: Once | INTRAVENOUS | Status: AC
  Administered 2024-02-25: 1000 mL via INTRAVENOUS

## 2024-02-25 MED ORDER — LORAZEPAM 2 MG/ML IJ SOLN
2.0000 mg | Freq: Once | INTRAMUSCULAR | Status: AC
  Administered 2024-02-25: 2 mg via INTRAVENOUS
  Filled 2024-02-25: qty 1

## 2024-02-25 MED ORDER — HEPARIN SODIUM (PORCINE) 5000 UNIT/ML IJ SOLN
5000.0000 [IU] | Freq: Three times a day (TID) | INTRAMUSCULAR | Status: DC
  Administered 2024-02-25 – 2024-02-28 (×7): 5000 [IU] via SUBCUTANEOUS
  Filled 2024-02-25 (×7): qty 1

## 2024-02-25 MED ORDER — VITAMIN B-12 1000 MCG PO TABS
1000.0000 ug | ORAL_TABLET | Freq: Every day | ORAL | Status: DC
  Administered 2024-02-26 – 2024-02-28 (×3): 1000 ug via ORAL
  Filled 2024-02-25 (×3): qty 1

## 2024-02-25 MED ORDER — ONDANSETRON HCL 4 MG/2ML IJ SOLN
4.0000 mg | Freq: Once | INTRAMUSCULAR | Status: AC
  Administered 2024-02-25: 4 mg via INTRAVENOUS
  Filled 2024-02-25: qty 2

## 2024-02-25 MED ORDER — ONDANSETRON HCL 4 MG/2ML IJ SOLN: 4.0000 mg | Freq: Four times a day (QID) | INTRAMUSCULAR | Status: DC | PRN

## 2024-02-25 MED ORDER — BICTEGRAVIR-EMTRICITAB-TENOFOV 50-200-25 MG PO TABS
1.0000 | ORAL_TABLET | Freq: Every day | ORAL | Status: DC
  Administered 2024-02-26 – 2024-02-28 (×2): 1 via ORAL
  Filled 2024-02-25 (×3): qty 1

## 2024-02-25 MED ORDER — LORAZEPAM 1 MG PO TABS: 1.0000 mg | ORAL_TABLET | ORAL | Status: DC | PRN

## 2024-02-25 MED ORDER — MORPHINE SULFATE (PF) 2 MG/ML IV SOLN: 2.0000 mg | INTRAVENOUS | Status: DC | PRN

## 2024-02-25 MED ORDER — QUETIAPINE FUMARATE 25 MG PO TABS
50.0000 mg | ORAL_TABLET | Freq: Two times a day (BID) | ORAL | Status: DC
  Administered 2024-02-25 – 2024-02-28 (×6): 50 mg via ORAL
  Filled 2024-02-25 (×6): qty 2

## 2024-02-25 MED ORDER — MORPHINE SULFATE (PF) 4 MG/ML IV SOLN
4.0000 mg | INTRAVENOUS | Status: DC | PRN
  Administered 2024-02-25 – 2024-02-26 (×4): 4 mg via INTRAVENOUS
  Filled 2024-02-25 (×6): qty 1

## 2024-02-25 MED ORDER — MORPHINE SULFATE (PF) 4 MG/ML IV SOLN: 4.0000 mg | INTRAVENOUS | Status: DC | PRN

## 2024-02-25 MED ORDER — MORPHINE SULFATE (PF) 4 MG/ML IV SOLN
4.0000 mg | Freq: Once | INTRAVENOUS | Status: AC
  Administered 2024-02-25: 4 mg via INTRAVENOUS
  Filled 2024-02-25: qty 1

## 2024-02-25 MED ORDER — FOLIC ACID 1 MG PO TABS
1.0000 mg | ORAL_TABLET | Freq: Every day | ORAL | Status: DC
  Administered 2024-02-25 – 2024-02-28 (×4): 1 mg via ORAL
  Filled 2024-02-25 (×4): qty 1

## 2024-02-25 NOTE — Assessment & Plan Note (Signed)
 Complete and safe cessation of alcohol use

## 2024-02-25 NOTE — ED Triage Notes (Signed)
 Pt comes with c/o alcohol , withdrawal and nausea. Pt states he drinks 1/2 gallon a liquor a day. Pt states last drink yesterday. Pt states he has been drinking for 10 years. Pt denies any drug use. Pt states hx of pancreatitis. Pt states dizziness, headache and vomiting.   Pt presenting with obvious DTs

## 2024-02-25 NOTE — TOC Progression Note (Signed)
 Transition of Care [facility]) - Progression Note    Patient Details  Name: Gary [name] MRN: [id number] Date of Birth: [birth year]  Transition of Care [facility]) CM/SW Contact  Asjah Rauda E Searcy Miyoshi, LCSW Phone Number: 02/25/2024, 2:33 PM  Clinical Narrative:    Patient discharged from [facility] 02/24/24 and was given resources per chart review. Will follow on this hospital stay for additional needs.        Expected Discharge Plan and Services                                               Social Determinants of Health (SDOH) Interventions SDOH Screenings   Food Insecurity: Food Insecurity Present (02/19/2024)  Housing: High Risk (02/19/2024)  Transportation Needs: Unmet Transportation Needs (02/19/2024)  Utilities: Not At Risk (02/19/2024)  Alcohol Screen: High Risk (07/25/2021)  Depression (PHQ2-9): Medium Risk (01/28/2024)  Financial Resource Strain: Patient Declined (10/11/2023)  Social Connections: Socially Isolated (02/03/2024)  Stress: No Stress Concern Present (06/30/2022)   Received from [facility]  Recent Concern: Stress - Stress Concern Present (06/25/2022)   Received from [facility]  Tobacco Use: High Risk (02/25/2024)    Readmission Risk Interventions    02/14/2024    3:10 PM 12/15/2023   11:23 AM 11/29/2023   10:27 AM  Readmission Risk Prevention Plan  Transportation Screening Complete Complete Complete  Medication Review Oceanographer) Complete Complete Complete  PCP or Specialist appointment within 3-5 days of discharge Patient refused Complete   HRI or Home Care Consult Complete Complete Complete  SW Recovery Care/Counseling Consult Complete Complete Complete  Palliative Care Screening Not Applicable Not Applicable Not Applicable  Skilled Nursing Facility Not Applicable Not Applicable Not Applicable

## 2024-02-25 NOTE — Assessment & Plan Note (Addendum)
 Home Biktarvy  resumed on admission for 4/19

## 2024-02-25 NOTE — ED Provider Notes (Signed)
 [facility] Provider Note    Event Date/Time   First MD Initiated Contact with Patient 02/25/24 1018     (approximate)   History   Chief Complaint Abdominal Pain and Alcohol  Intoxication   HPI  Gary [name] is a 38 y.o. male with past medical history of alcohol  abuse, chronic pancreatitis, seizures, schizophrenia, and HIV who presents to the ED complaining of abdominal pain.  Patient reports that he has had about 24 hours of diffuse abdominal pain, particularly severe in the epigastrium, associated with nausea and vomiting.  He has been unable to keep anything down during this time, but denies any associated diarrhea.  He describes symptoms as similar to prior episodes of pancreatitis, continues to drink liquor on a daily basis with his last drink yesterday.  He is now concerned about alcohol  withdrawal, states that he has been feeling increasingly shaky.     Physical Exam   Triage Vital Signs: ED Triage Vitals  Encounter Vitals Group     BP 02/25/24 1020 114/79     Systolic BP Percentile --      Diastolic BP Percentile --      Pulse Rate 02/25/24 1020 83     Resp 02/25/24 1020 19     Temp 02/25/24 1020 99.6 F (37.6 C)     Temp src --      SpO2 02/25/24 1020 100 %     Weight 02/25/24 1018 140 lb (63.5 kg)     Height 02/25/24 1018 6' (1.829 m)     Head Circumference --      Peak Flow --      Pain Score 02/25/24 1018 10     Pain Loc --      Pain Education --      Exclude from Growth Chart --     Most recent vital signs: Vitals:   02/25/24 1020 02/25/24 1021  BP: 114/79 114/79  Pulse: 83 83  Resp: 19   Temp: 99.6 F (37.6 C)   SpO2: 100%     Constitutional: Alert and oriented. Eyes: Conjunctivae are normal. Head: Atraumatic. Nose: No congestion/rhinnorhea. Mouth/Throat: Mucous membranes are moist.  Cardiovascular: Normal rate, regular rhythm. Grossly normal heart sounds.  2+ radial pulses bilaterally. Respiratory: Normal  respiratory effort.  No retractions. Lungs CTAB. Gastrointestinal: Soft and diffusely tender to palpation, greatest in the epigastrium.  No distention. Musculoskeletal: No lower extremity tenderness nor edema.  Neurologic:  Normal speech and language. No gross focal neurologic deficits are appreciated.  Tremulous with tongue fasciculations.    ED Results / Procedures / Treatments   Labs (all labs ordered are listed, but only abnormal results are displayed) Labs Reviewed  URINALYSIS, ROUTINE W REFLEX MICROSCOPIC - Abnormal; Notable for the following components:      Result Value   Color, Urine YELLOW (*)    APPearance CLEAR (*)    Ketones, ur 5 (*)    Protein, ur 30 (*)    Bacteria, UA RARE (*)    All other components within normal limits  CBC WITH DIFFERENTIAL/PLATELET - Abnormal; Notable for the following components:   WBC 3.8 (*)    RBC 3.37 (*)    Hemoglobin 10.5 (*)    HCT 33.0 (*)    All other components within normal limits  COMPREHENSIVE METABOLIC PANEL WITH GFR - Abnormal; Notable for the following components:   Sodium 133 (*)    All other components within normal limits  CBC  ETHANOL  LIPASE, BLOOD  MAGNESIUM      EKG  ED ECG REPORT I, Carlin Palin, the attending physician, personally viewed and interpreted this ECG.   Date: 02/25/2024  EKG Time: 10:30  Rate: 82  Rhythm: normal sinus rhythm  Axis: Normal  Intervals: Borderline prolonged QT  ST&T Change: None  PROCEDURES:  Critical Care performed: No  Procedures   MEDICATIONS ORDERED IN ED: Medications  ondansetron  (ZOFRAN ) injection 4 mg (4 mg Intravenous Given 02/25/24 1049)  LORazepam  (ATIVAN ) injection 2 mg (2 mg Intravenous Given 02/25/24 1048)  sodium chloride  0.9 % bolus 1,000 mL (0 mLs Intravenous Stopped 02/25/24 1220)  HYDROmorphone  (DILAUDID ) injection 1 mg (1 mg Intravenous Given 02/25/24 1210)     IMPRESSION / MDM / ASSESSMENT AND PLAN / ED COURSE  I reviewed the triage vital signs  and the nursing notes.                              38 y.o. male with past medical history of alcohol  abuse, chronic pancreatitis, seizures, HIV, and schizophrenia who presents to the ED complaining of 24 hours of upper abdominal pain with vomiting.  Patient's presentation is most consistent with acute presentation with potential threat to life or bodily function.  Differential diagnosis includes, but is not limited to, pancreatitis, hepatitis, chronic pancreatitis, gastritis, GERD, cholecystitis, biliary colic, anemia, electrolyte abnormality, AKI.  Patient nontoxic-appearing and in no acute distress, vital signs are unremarkable.  He does have tremors and tongue fasciculations concerning for alcohol  withdrawal, will treat with IV Ativan  and IV Zofran .  His abdomen is diffusely tender to palpation, greatest in the epigastrium and consistent with acute on chronic pancreatitis.  Lab results are pending at this time.  Labs without significant anemia, leukocytosis, electrolyte abnormality, or AKI.  LFTs and lipase are unremarkable, suspect symptoms due to chronic pancreatitis.  Alcohol  withdrawal symptoms do seem to be improved on reassessment, but patient continues to have significant pain and nausea, unable to tolerate oral intake.  Case discussed with hospitalist for admission.      FINAL CLINICAL IMPRESSION(S) / ED DIAGNOSES   Final diagnoses:  Alcohol  withdrawal syndrome without complication (HCC)  Alcohol -induced chronic pancreatitis (HCC)     Rx / DC Orders   ED Discharge Orders     None        Note:  This document was prepared using Dragon voice recognition software and may include unintentional dictation errors.   Palin Carlin, MD 02/25/24 [phone]

## 2024-02-25 NOTE — ED Provider Notes (Signed)
 [facility] Emergency Department Provider Note MRN:  [id number]  Arrival date & time: 02/25/24     Chief Complaint   Abdominal Pain   History of Present Illness   Gary [name] is a 38 y.o. year-old male with a history of schizophrenia, HIV, alcohol induced pancreatitis presenting to the ED with chief complaint of abdominal pain.  Continued upper abdominal pain for the past week.  Has had recent admissions for pancreatitis.  Unfortunately he began drinking again after his recent discharge and now the pain is worse again.  Denies fever.  Review of Systems  A thorough review of systems was obtained and all systems are negative except as noted in the HPI and PMH.   Patient's Health History    Past Medical History:  Diagnosis Date   Alcohol abuse    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   Anxiety    Bipolar 2 disorder (HCC)    HIV (human immunodeficiency virus infection) (HCC)    Pancreatitis    PTSD (post-traumatic stress disorder)    Schizophrenia (HCC)    Seizures (HCC)    Subdural hematoma (HCC)     Past Surgical History:  Procedure Laterality Date   BIOPSY  04/19/2022   Procedure: BIOPSY;  Surgeon: Normie Becton., MD;  Location: [facility] ENDOSCOPY;  Service: Gastroenterology;;   ENTEROSCOPY N/A 04/19/2022   Procedure: ENTEROSCOPY;  Surgeon: Normie Becton., MD;  Location: [facility] ENDOSCOPY;  Service: Gastroenterology;  Laterality: N/A;   INCISION AND DRAINAGE PERIRECTAL ABSCESS N/A 09/24/2016   Procedure: IRRIGATION AND DEBRIDEMENT PERIRECTAL ABSCESS;  Surgeon: Gwyndolyn Lerner, MD;  Location: [facility] ORS;  Service: General;  Laterality: N/A;   none      Family History  Problem Relation Age of Onset   Alcohol abuse Mother    Alcohol abuse Father    Colon cancer Other    Other Other    Cancer Other     Social History   Socioeconomic History   Marital status: Single    Spouse name: Not on file   Number of children: Not on file    Years of education: Not on file   Highest education level: Not on file  Occupational History   Occupation: unemployed  Tobacco Use   Smoking status: Every Day    Current packs/day: 0.50    Average packs/day: 0.5 packs/day for 21.3 years (10.6 ttl pk-yrs)    Types: Cigarettes    Start date: 2004   Smokeless tobacco: Never   Tobacco comments:    unable to smoke while incarcerated 6+ months 02/13/20  Vaping Use   Vaping status: Never Used  Substance and Sexual Activity   Alcohol use: Yes    Alcohol/week: 105.0 standard drinks of alcohol    Types: 105 Cans of beer per week    Comment: drinks every day, all day, "whatever I can get my hands on". "Half a gallon of vodka" 12/16   Drug use: Not Currently    Types: Methamphetamines, Cocaine    Comment: last used 10/22/2023   Sexual activity: Yes    Partners: Male, Male    Comment: declined condoms 01/2024  Other Topics Concern   Not on file  Social History Narrative   "Currently living on the streets"   Independent at baseline   Occasionally goes to the [facility]   Social Drivers of Health   Financial Resource Strain: Patient Declined (10/11/2023)   Overall Financial Resource Strain (CARDIA)    Difficulty of Paying Living Expenses: Patient  declined  Food Insecurity: Food Insecurity Present (02/19/2024)   Hunger Vital Sign    Worried About Running Out of Food in the Last Year: Often true    Ran Out of Food in the Last Year: Often true  Transportation Needs: Unmet Transportation Needs (02/19/2024)   PRAPARE - Administrator, Civil Service (Medical): Yes    Lack of Transportation (Non-Medical): Yes  Physical Activity: Not on file  Stress: No Stress Concern Present (06/30/2022)   Received from [facility], [facility]   [employer] of Occupational Health - Occupational Stress Questionnaire    Feeling of Stress : Not at all  Recent Concern: Stress - Stress Concern Present (06/25/2022)   Received from [facility] of Occupational Health - Occupational Stress Questionnaire    Feeling of Stress : Rather much  Social Connections: Socially Isolated (02/03/2024)   Social Connection and Isolation Panel [NHANES]    Frequency of Communication with Friends and Family: Never    Frequency of Social Gatherings with Friends and Family: Never    Attends Religious Services: Never    Database administrator or Organizations: No    Attends Banker Meetings: Never    Marital Status: Never married  Intimate Partner Violence: Not At Risk (02/19/2024)   Humiliation, Afraid, Rape, and Kick questionnaire    Fear of Current or Ex-Partner: No    Emotionally Abused: No    Physically Abused: No    Sexually Abused: No     Physical Exam   Vitals:   02/25/24 0500 02/25/24 0600  BP: 118/83 113/86  Pulse: 82 84  Resp: 20 18  Temp:    SpO2: 100% 99%    CONSTITUTIONAL: Chronically ill-appearing, moderate distress due to pain NEURO/PSYCH:  Alert and oriented x 3, no focal deficits EYES:  eyes equal and reactive ENT/NECK:  no LAD, no JVD CARDIO: Regular rate, well-perfused, normal S1 and S2 PULM:  CTAB no wheezing or rhonchi GI/GU:  non-distended, moderate epigastric tenderness to palpation MSK/SPINE:  No gross deformities, no edema SKIN:  no rash, atraumatic   *Additional and/or pertinent findings included in MDM below  Diagnostic and Interventional Summary    EKG Interpretation Date/Time:    Ventricular Rate:    PR Interval:    QRS Duration:    QT Interval:    QTC Calculation:   R Axis:      Text Interpretation:         Labs Reviewed  COMPREHENSIVE METABOLIC PANEL WITH GFR - Abnormal; Notable for the following components:      Result Value   Chloride 94 (*)    Glucose, Bld 124 (*)    Calcium  11.5 (*)    Total Protein 8.3 (*)    Anion gap 17 (*)    All other components within normal limits  CBC WITH DIFFERENTIAL/PLATELET - Abnormal; Notable for the following  components:   HCT 38.4 (*)    All other components within normal limits  LIPASE, BLOOD  RAPID URINE DRUG SCREEN, HOSP PERFORMED    No orders to display    Medications  morphine  (PF) 4 MG/ML injection 4 mg (has no administration in time range)  ondansetron  (ZOFRAN -ODT) disintegrating tablet 4 mg (4 mg Oral Given 02/24/24 1952)  oxyCODONE  (Oxy IR/ROXICODONE ) immediate release tablet 5 mg (5 mg Oral Given 02/24/24 2029)  HYDROmorphone  (DILAUDID ) injection 1 mg (1 mg Intravenous Given 02/25/24 0524)  ondansetron  (ZOFRAN ) injection 4 mg (4 mg  Intravenous Given 02/25/24 0521)  sodium chloride  0.9 % bolus 1,000 mL (1,000 mLs Intravenous New Bag/Given 02/25/24 4098)     Procedures  /  Critical Care Procedures  ED Course and Medical Decision Making  Initial Impression and Ddx Acute on chronic pancreatitis, frequent ED visitor, has had multiple recent admissions for pancreatitis.  Per the recent discharge summary there were concerns for drug-seeking behavior, they also counseled him extensively on quitting alcohol.  Still he started drinking again after discharge yesterday.  He is homeless and he has very little support.  Past medical/surgical history that increases complexity of ED encounter: Alcohol use disorder, recurrent pancreatitis  Interpretation of Diagnostics I personally reviewed the Laboratory Testing and my interpretation is as follows: No significant blood count or electrolyte disturbance.    Patient Reassessment and Ultimate Disposition/Management     Patient seems more comfortable on repeat assessment, appropriate for discharge with return precautions.  Patient management required discussion with the following services or consulting groups:  None  Complexity of Problems Addressed Acute illness or injury that poses threat of life of bodily function  Additional Data Reviewed and Analyzed Further history obtained from: Prior labs/imaging results  Additional Factors Impacting  ED Encounter Risk Use of parenteral controlled substances and Consideration of hospitalization  Merrick Abe. Harless Lien, MD [facility] Emergency Medicine [facility] [email or web address]  Final Clinical Impressions(s) / ED Diagnoses     ICD-10-CM   1. Alcohol-induced acute pancreatitis, unspecified complication status  K85.20       ED Discharge Orders     None        Discharge Instructions Discussed with and Provided to Patient:     Discharge Instructions      You were evaluated in the Emergency Department and after careful evaluation, we did not find any emergent condition requiring admission or further testing in the hospital.  Your exam/testing today is overall reassuring.  Symptoms likely due to recurrent pancreatitis.  Recommend limiting your diet and avoiding alcohol as we discussed.  Please return to the Emergency Department if you experience any worsening of your condition.   Thank you for allowing us  to be a part of your care.       Edson Graces, MD 02/25/24 [phone]

## 2024-02-25 NOTE — Hospital Course (Addendum)
 Gary [name] is 38 y.o. male with HIV, alcohol dependence, schizophrenia, chronic pancreatitis, frequent admissions to the hospital, was discharged on 4/17, went home and drank alcohol, and represented to the ED.  On this admission he is concerned he is experiencing alcohol withdrawals.  He reports he drinks half a gallon of vodka daily, though no has not consumed that volume in weeks as he has been hospitalized. Patient was admitted on CIWA protocol with as needed Ativan .  He did not demonstrate any clinical evidence of withdrawal while admitted, no tachycardia, no tremors.  He did frequently endorse anxiety. Stay was further prolonged by complaints of abdominal pain, and suicidal and homicidal ideation.  There are ongoing concerns of opioid dependence, and medication seeking behaviors.  Psychiatry was consulted.  Multiple conversations were had with the patient.  Please see their documentation for further review. On day of discharge patient was amendable to returning home with prescriptions for NSAIDs and gabapentin .  Opioid dependence Medication Seeking behaviors - Agree with psychiatry: This patient has a well-documented history of severe opioid use disorder, recurrent polysubstance use, and maladaptive patterns of emergency room utilization, including frequent presentations across multiple hospital systems for subjective complaints that escalate to requests for high-risk controlled substances and refusal of alternative pain medications.   His current presentation is consistent with prior patterns: He presents with vague suicidal and homicidal ideation that dissipates when offered non-controlled, lower-risk medications. His safety narrative shifts based on whether access to controlled medications is denied, suggesting instrumental or goal-directed ideation (i.e., ideation used as leverage to obtain medications or inpatient status).    Alcohol dependence with risk of withdrawal Hyponatremia -- No  evidence of alcohol withdrawal during this admission.  Unlikely patient was out of the hospital long enough to experience withdrawals here (presented within 12hrs of discharge) - Patient consistently answers CIWA questions at highest level. No evidence of alcohol withdrawal on clinical exam.  No tremors, no tachycardia.    Chronic pancreatitis - Discharged and returned immediately after admission for acute on chronic pancreatitis.  Patient continues to drink alcohol despite extensive counseling on multiple prior admissions including this one - Lipase 33 on arrival.   -Continue gabapentin  and Mobic  for chronic pain - Follow-up with primary care physician/pain management MD for chronic pain management.    Frequent readmissions - Frequently returns on day of discharge or within 24 hours - Frequent admissions secondary to pancreatitis, alcohol withdrawal, and pain seeking behaviors --TOC consulted for resource management.  Suicidal and homicidal ideation - Psychiatry has been consulted, no need for inpatient admission at this time.   -- Bedside RN to call security if patient becomes violent or aggressive    HIV disease - Resume home dose Biktarvy    Severe protein calorie malnutrition secondary to alcohol abuse - Continue with supplements twice daily

## 2024-02-25 NOTE — Discharge Instructions (Signed)
 You were evaluated in the Emergency Department and after careful evaluation, we did not find any emergent condition requiring admission or further testing in the hospital.  Your exam/testing today is overall reassuring.  Symptoms likely due to recurrent pancreatitis.  Recommend limiting your diet and avoiding alcohol as we discussed.  Please return to the Emergency Department if you experience any worsening of your condition.   Thank you for allowing us  to be a part of your care.

## 2024-02-25 NOTE — Assessment & Plan Note (Signed)
 With chronic pancreatitis, extensive counseling at bedside regarding safe alcohol cessation in the inpatient setting like today Patient states that he does not want to drink anymore he knows that it is killing him

## 2024-02-25 NOTE — Assessment & Plan Note (Addendum)
 Check phosphorus on admission Ensure supplements twice daily between meals ordered on admission

## 2024-02-25 NOTE — Assessment & Plan Note (Signed)
 CIWA precaution initiated on admission LR infusion at 125 mL/h, 1 day ordered Continue thiamine  100 mg p.o. daily Multivitamin 1 tablet, folic acid  1 mg tablet initiated on admission Seizure, fall precaution

## 2024-02-25 NOTE — ED Notes (Signed)
 Called lab to add on Phosphorus to labs drawn earlier, they stated they could.

## 2024-02-25 NOTE — ED Notes (Signed)
 Lab @ bedside to collect CBC w/diff

## 2024-02-25 NOTE — ED Notes (Signed)
 Pt made a comment about SI, stating he is a chronic alcohol user and always in chronic pain, states that he doesn't feel like living anymore and called his brother with the same information. He said brother talked him out of it today, but still feels depressed and not like living like this anymore because he has lived a rough life. EDP made aware.
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
## 2024-02-26 DIAGNOSIS — K2990 Gastroduodenitis, unspecified, without bleeding: Secondary | ICD-10-CM

## 2024-02-26 DIAGNOSIS — F411 Generalized anxiety disorder: Secondary | ICD-10-CM

## 2024-02-26 DIAGNOSIS — B20 Human immunodeficiency virus [HIV] disease: Secondary | ICD-10-CM | POA: Diagnosis not present

## 2024-02-26 DIAGNOSIS — E43 Unspecified severe protein-calorie malnutrition: Secondary | ICD-10-CM

## 2024-02-26 DIAGNOSIS — F10939 Alcohol use, unspecified with withdrawal, unspecified: Secondary | ICD-10-CM

## 2024-02-26 DIAGNOSIS — K700 Alcoholic fatty liver: Secondary | ICD-10-CM | POA: Diagnosis not present

## 2024-02-26 DIAGNOSIS — K5900 Constipation, unspecified: Secondary | ICD-10-CM

## 2024-02-26 DIAGNOSIS — F1910 Other psychoactive substance abuse, uncomplicated: Secondary | ICD-10-CM

## 2024-02-26 DIAGNOSIS — K860 Alcohol-induced chronic pancreatitis: Secondary | ICD-10-CM

## 2024-02-26 LAB — CBC
HCT: 28.8 % — ABNORMAL LOW (ref 39.0–52.0)
Hemoglobin: 9.6 g/dL — ABNORMAL LOW (ref 13.0–17.0)
MCH: 31.2 pg (ref 26.0–34.0)
MCHC: 33.3 g/dL (ref 30.0–36.0)
MCV: 93.5 fL (ref 80.0–100.0)
Platelets: 176 10*3/uL (ref 150–400)
RBC: 3.08 MIL/uL — ABNORMAL LOW (ref 4.22–5.81)
RDW: 13.7 % (ref 11.5–15.5)
WBC: 4 10*3/uL (ref 4.0–10.5)
nRBC: 0 % (ref 0.0–0.2)

## 2024-02-26 LAB — BASIC METABOLIC PANEL WITH GFR
Anion gap: 6 (ref 5–15)
BUN: 12 mg/dL (ref 6–20)
CO2: 23 mmol/L (ref 22–32)
Calcium: 8.4 mg/dL — ABNORMAL LOW (ref 8.9–10.3)
Chloride: 104 mmol/L (ref 98–111)
Creatinine, Ser: 0.77 mg/dL (ref 0.61–1.24)
GFR, Estimated: >60 mL/min (ref >60)
Glucose, Bld: 110 mg/dL — ABNORMAL HIGH (ref 70–99)
Potassium: 4.4 mmol/L (ref 3.5–5.1)
Sodium: 133 mmol/L — ABNORMAL LOW (ref 135–145)

## 2024-02-26 MED ORDER — GABAPENTIN 400 MG PO CAPS
400.0000 mg | ORAL_CAPSULE | Freq: Three times a day (TID) | ORAL | Status: DC
  Administered 2024-02-26 – 2024-02-27 (×3): 400 mg via ORAL
  Filled 2024-02-26 (×4): qty 1

## 2024-02-26 MED ORDER — LACTATED RINGERS IV SOLN: INTRAVENOUS | Status: DC

## 2024-02-26 MED ORDER — LORAZEPAM 2 MG/ML IJ SOLN
1.0000 mg | Freq: Four times a day (QID) | INTRAMUSCULAR | Status: DC | PRN
  Administered 2024-02-26 – 2024-02-27 (×2): 1 mg via INTRAVENOUS
  Filled 2024-02-26 (×2): qty 1

## 2024-02-26 MED ORDER — MORPHINE SULFATE (PF) 2 MG/ML IV SOLN
1.0000 mg | INTRAVENOUS | Status: DC | PRN
  Administered 2024-02-26 – 2024-02-27 (×5): 1 mg via INTRAVENOUS
  Filled 2024-02-26 (×5): qty 1

## 2024-02-26 MED ORDER — KETOROLAC TROMETHAMINE 15 MG/ML IJ SOLN
15.0000 mg | Freq: Three times a day (TID) | INTRAMUSCULAR | Status: DC | PRN
  Administered 2024-02-26 – 2024-02-27 (×2): 15 mg via INTRAVENOUS
  Filled 2024-02-26 (×2): qty 1
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
## 2024-02-26 NOTE — Plan of Care (Signed)
   Problem: Health Behavior/Discharge Planning: Goal: Ability to manage health-related needs will improve Outcome: Not Progressing

## 2024-02-27 DIAGNOSIS — E43 Unspecified severe protein-calorie malnutrition: Secondary | ICD-10-CM | POA: Diagnosis not present

## 2024-02-27 DIAGNOSIS — F332 Major depressive disorder, recurrent severe without psychotic features: Secondary | ICD-10-CM | POA: Diagnosis not present

## 2024-02-27 DIAGNOSIS — K700 Alcoholic fatty liver: Secondary | ICD-10-CM | POA: Diagnosis not present

## 2024-02-27 DIAGNOSIS — F411 Generalized anxiety disorder: Secondary | ICD-10-CM | POA: Diagnosis not present

## 2024-02-27 DIAGNOSIS — B20 Human immunodeficiency virus [HIV] disease: Secondary | ICD-10-CM | POA: Diagnosis not present

## 2024-02-27 LAB — CBC WITH DIFFERENTIAL/PLATELET
Abs Immature Granulocytes: 0.01 K/uL (ref 0.00–0.07)
Basophils Absolute: 0 K/uL (ref 0.0–0.1)
Basophils Relative: 1 %
Eosinophils Absolute: 0.1 K/uL (ref 0.0–0.5)
Eosinophils Relative: 4 %
HCT: 31.4 % — ABNORMAL LOW (ref 39.0–52.0)
Hemoglobin: 10.4 g/dL — ABNORMAL LOW (ref 13.0–17.0)
Immature Granulocytes: 1 %
Lymphocytes Relative: 43 %
Lymphs Abs: 1 K/uL (ref 0.7–4.0)
MCH: 30.8 pg (ref 26.0–34.0)
MCHC: 33.1 g/dL (ref 30.0–36.0)
MCV: 92.9 fL (ref 80.0–100.0)
Monocytes Absolute: 0.1 K/uL (ref 0.1–1.0)
Monocytes Relative: 6 %
Neutro Abs: 1 K/uL — ABNORMAL LOW (ref 1.7–7.7)
Neutrophils Relative %: 45 %
Platelets: 163 K/uL (ref 150–400)
RBC: 3.38 MIL/uL — ABNORMAL LOW (ref 4.22–5.81)
RDW: 13.5 % (ref 11.5–15.5)
WBC: 2.2 K/uL — ABNORMAL LOW (ref 4.0–10.5)
nRBC: 0 % (ref 0.0–0.2)

## 2024-02-27 LAB — COMPREHENSIVE METABOLIC PANEL WITH GFR
ALT: 11 U/L (ref 0–44)
AST: 12 U/L — ABNORMAL LOW (ref 15–41)
Albumin: 3.2 g/dL — ABNORMAL LOW (ref 3.5–5.0)
Alkaline Phosphatase: 44 U/L (ref 38–126)
Anion gap: 5 (ref 5–15)
BUN: 8 mg/dL (ref 6–20)
CO2: 25 mmol/L (ref 22–32)
Calcium: 9 mg/dL (ref 8.9–10.3)
Chloride: 107 mmol/L (ref 98–111)
Creatinine, Ser: 0.71 mg/dL (ref 0.61–1.24)
GFR, Estimated: >60 mL/min
Glucose, Bld: 89 mg/dL (ref 70–99)
Potassium: 4.1 mmol/L (ref 3.5–5.1)
Sodium: 137 mmol/L (ref 135–145)
Total Bilirubin: 0.5 mg/dL (ref 0.0–1.2)
Total Protein: 6.1 g/dL — ABNORMAL LOW (ref 6.5–8.1)

## 2024-02-27 LAB — MAGNESIUM: Magnesium: 1.9 mg/dL (ref 1.7–2.4)

## 2024-02-27 LAB — PHOSPHORUS: Phosphorus: 3.4 mg/dL (ref 2.5–4.6)

## 2024-02-27 MED ORDER — HYDROCODONE-ACETAMINOPHEN 5-325 MG PO TABS
1.0000 | ORAL_TABLET | Freq: Three times a day (TID) | ORAL | Status: DC | PRN
  Administered 2024-02-27 – 2024-02-28 (×2): 1 via ORAL
  Filled 2024-02-27 (×2): qty 1

## 2024-02-27 MED ORDER — ACETAMINOPHEN 325 MG PO TABS
650.0000 mg | ORAL_TABLET | Freq: Four times a day (QID) | ORAL | Status: DC
  Administered 2024-02-27 – 2024-02-28 (×4): 650 mg via ORAL
  Filled 2024-02-27 (×4): qty 2

## 2024-02-27 MED ORDER — GABAPENTIN 300 MG PO CAPS
600.0000 mg | ORAL_CAPSULE | Freq: Three times a day (TID) | ORAL | Status: DC
  Administered 2024-02-27 – 2024-02-28 (×2): 600 mg via ORAL
  Filled 2024-02-27 (×2): qty 2

## 2024-02-27 MED ORDER — KETOROLAC TROMETHAMINE 15 MG/ML IJ SOLN
30.0000 mg | Freq: Three times a day (TID) | INTRAMUSCULAR | Status: DC | PRN
  Administered 2024-02-28: 30 mg via INTRAVENOUS
  Filled 2024-02-27: qty 2

## 2024-02-27 NOTE — Progress Notes (Signed)
 PROGRESS NOTE    [name]  WJX:[id number] DOB: Apr 07, 1986 DOA: 02/25/2024 PCP: Pcp, No  Chief Complaint  Patient presents with   Abdominal Pain   Alcohol Intoxication    Hospital Course:  Gary [name] is 38 y.o. male with HIV, alcohol dependence, schizophrenia, chronic pancreatitis, frequent admissions to the hospital, was discharged on 4/17, went home and drink alcohol, and represented to the ED.  On this admission he is concerned he is experiencing alcohol withdrawals.  He reports he drinks half a gallon of vodka daily.  Subjective: Strictly n.p.o. overnight, he reports he went to nursing station and drink Ensure drinks despite being told that he is NPO.  This morning he is requesting to eat more food.  He is also requesting for increase in pain medications.  I had extensive discussion with him with bedside RN Mariah Shines regarding the care plan and standard of care treatment for pancreatitis.   Objective: Vitals:   02/26/24 1744 02/26/24 2004 02/27/24 0258 02/27/24 0830  BP: 95/63 94/69 (!) 83/55 91/69  Pulse:  62 85 72  Resp: 18 16 16 17   Temp:  98.8 F (37.1 C) 98.3 F (36.8 C) 98.2 F (36.8 C)  TempSrc:    Oral  SpO2:  100% 100% 100%  Weight:      Height:        Intake/Output Summary (Last 24 hours) at 02/27/2024 0919 Last data filed at 02/27/2024 0400 Gross per 24 hour  Intake 1362.5 ml  Output 1000 ml  Net 362.5 ml   Filed Weights   02/25/24 1018  Weight: 63.5 kg    Examination: General exam: Appears calm and comfortable, he is frail and chronically ill-appearing Respiratory system: No work of breathing, symmetric chest wall expansion Cardiovascular system Patient refuses physical exam Gastrointestinal system: Patient refuses physical exam Neuro: Alert and oriented. No focal neurological deficits. Extremities: Symmetric, expected ROM Skin: No rashes, lesions Psychiatry: flat. Irritated.  Assessment & Plan:  Principal Problem:   Alcohol dependence  with withdrawal (HCC) Active Problems:   HIV disease (HCC)   Polysubstance abuse (HCC)   Constipation   Alcoholic fatty liver   Chronic pancreatitis (HCC)   Gastritis and duodenitis   Anxiety state   PTSD (post-traumatic stress disorder)   Severe protein-calorie malnutrition (HCC)     Opioid dependence Medication Seeking behaviors - Agree with psychiatry: This patient has a well-documented history of severe opioid use disorder, recurrent polysubstance use, and maladaptive patterns of emergency room utilization, including frequent presentations across multiple hospital systems for subjective complaints that escalate to requests for high-risk controlled substances and refusal of alternative pain medications.   His current presentation is consistent with prior patterns: He presents with vague suicidal and homicidal ideation that dissipates when offered non-controlled, lower-risk medications. His safety narrative shifts based on whether access to controlled medications is denied, suggesting instrumental or goal-directed ideation (i.e., ideation used as leverage to obtain medications or inpatient status).   Alcohol dependence with risk of withdrawal Hyponatremia --Cont with CIWA protocol, Ativan  as needed.  Of note patient was only out of hospital for 12 hours.  Doubt he was able to consume enough alcohol to actively withdrawal during this admission. - Patient consistently answers CIWA questions at highest level. No evidence of alcohol withdrawal on clinical exam.  No tremors, no tachycardia.  - Multivitamin, folic acid  - Seizure precautions  Chronic pancreatitis - Discharged and returned immediately after admission for acute on chronic pancreatitis.  Patient continues to drink alcohol  despite extensive counseling on multiple prior admissions including this one - Lipase 33 on arrival.  Patient endorses high pain levels but also endorses appetite. - After extensive discussion with patient, we  will discontinue IV fluids today and allow for clear liquid diet. He appears to be tolerating this without issue. - Given he is now able to tolerate diet we can discontinue opioids. - Blood pressures are trending low, avoid further opioids to avoid hypotension.  MAP goal above 65.  Frequent readmissions - Frequently returns on day of discharge or within 24 hours - Frequent admissions secondary to pancreatitis, alcohol withdrawal, and pain seeking behaviors - Patient was recently discharged on 4/16, reports he went home, drank alcohol, and immediately represented to the ED for alcohol withdrawal -TOC has been consulted.  Suicidal and homicidal ideation - Psychiatry has been consulted, no need for inpatient admission at this time.   -- Bedside RN to call security if patient becomes violent or aggressive   HIV disease - Resume home dose Biktarvy   Severe protein calorie malnutrition secondary to alcohol abuse - Continue with supplements twice daily  DVT prophylaxis: Heparin    Code Status: Full Code Disposition:  Inpatient, still hospitalized for pain control. Anticipate discharge tomorrow  Consultants:    Procedures:    Antimicrobials:  Anti-infectives (From admission, onward)    Start     Dose/Rate Route Frequency Ordered Stop   02/26/24 1000  bictegravir-emtricitabine -tenofovir  AF (BIKTARVY ) 50-200-25 MG per tablet 1 tablet        1 tablet Oral Daily 02/25/24 1515         Data Reviewed: I have personally reviewed following labs and imaging studies CBC: Recent Labs  Lab 02/21/24 0738 02/22/24 0425 02/24/24 1914 02/25/24 1033 02/25/24 1124 02/26/24 0520 02/27/24 0338  WBC 2.2* 2.3* 5.8 SPECIMEN CONTAMINATED, UNABLE TO PERFORM TEST(S). 3.8* 4.0 2.2*  NEUTROABS 1.1* 1.5* 4.6  --  2.4  --  1.0*  HGB 10.0* 9.6* 13.3 SPECIMEN CONTAMINATED, UNABLE TO PERFORM TEST(S). 10.5* 9.6* 10.4*  HCT 31.2* 29.8* 38.4* SPECIMEN CONTAMINATED, UNABLE TO PERFORM TEST(S). 33.0* 28.8* 31.4*   MCV 96.0 97.1 90.4 SPECIMEN CONTAMINATED, UNABLE TO PERFORM TEST(S). 97.9 93.5 92.9  PLT 106* 133* 265 SPECIMEN CONTAMINATED, UNABLE TO PERFORM TEST(S). 210 176 163   Basic Metabolic Panel: Recent Labs  Lab 02/23/24 0421 02/24/24 1914 02/25/24 1148 02/25/24 1501 02/26/24 0520 02/27/24 0338  NA 134* 139 133*  --  133* 137  K 4.2 4.2 3.7  --  4.4 4.1  CL 101 94* 100  --  104 107  CO2 26 28 22   --  23 25  GLUCOSE 101* 124* 99  --  110* 89  BUN 6 8 15   --  12 8  CREATININE 0.55* 1.01 0.89  --  0.77 0.71  CALCIUM  9.0 11.5* 9.0  --  8.4* 9.0  MG  --   --  2.2  --   --  1.9  PHOS  --   --   --  3.7  --  3.4   GFR: Estimated Creatinine Clearance: 113.6 mL/min (by C-G formula based on SCr of 0.71 mg/dL). Liver Function Tests: Recent Labs  Lab 02/20/24 1909 02/21/24 0737 02/24/24 1914 02/25/24 1148 02/27/24 0338  AST 12* 11* 20 19 12*  ALT 10 8 13 13 11   ALKPHOS 55 44 65 54 44  BILITOT 0.8 1.0 0.8 0.9 0.5  PROT 6.8 6.0* 8.3* 7.7 6.1*  ALBUMIN 3.6 3.1* 4.2 4.1 3.2*   CBG:  Recent Labs  Lab 02/21/24 1129 02/21/24 1203 02/21/24 1832 02/21/24 1833 02/21/24 1858  GLUCAP 51* 147* 46* 55* 115*    No results found for this or any previous visit (from the past 240 hours).   Radiology Studies: No results found.  Scheduled Meds:  bictegravir-emtricitabine -tenofovir  AF  1 tablet Oral Daily   cyanocobalamin   1,000 mcg Oral Daily   feeding supplement  237 mL Oral BID BM   folic acid   1 mg Oral Daily   gabapentin   400 mg Oral TID   heparin   5,000 Units Subcutaneous Q8H   multivitamin with minerals  1 tablet Oral Daily   QUEtiapine   50 mg Oral BID   thiamine   100 mg Oral Daily   Continuous Infusions:  lactated ringers  125 mL/hr at 02/26/24 1706     LOS: 2 days  MDM: Patient is high risk for one or more organ failure.  They necessitate ongoing hospitalization for continued IV therapies and subsequent lab monitoring. Total time spent interpreting labs and vitals,  reviewing the medical record, coordinating care amongst consultants and care team members, directly assessing and discussing care with the patient and/or family: 55 min  Eleasha Cataldo, DO [facility]  To contact the attending physician between 7A-7P please use Epic Chat. To contact the covering physician during after hours 7P-7A, please review Amion.  02/27/2024, 9:19 AM   *This document has been created with the assistance of dictation software. Please excuse typographical errors. *

## 2024-02-27 NOTE — Consult Note (Addendum)
 [facility] Psychiatric Consult Initial  Patient Name: .Gary [name]  MRN: [id number]  DOB: 07/27/1986  Consult Order details:  Orders (From admission, onward)     Start     Ordered   02/26/24 1514  IP CONSULT TO PSYCHIATRY       Ordering Provider: Dezii, Alexandra, DO  Provider:  (Not yet assigned)  Question Answer Comment  Location [facility]   Reason for Consult? severe substance abuse, suicidal and homocidal ideation      02/26/24 1513             Mode of Visit: In person    Psychiatry Consult Evaluation  Service Date: February 27, 2024 LOS:  LOS: 2 days  Chief Complaint substance-induced mood disorder  Primary Psychiatric Diagnoses  Alcohol dependency 2.  Homicidal ideations 3.  Major depressive disorder  Assessment  Gary [name] is a 38 y.o. male admitted: Medicallyfor 02/25/2024 10:17 AM for alcohol intoxication. He carries the psychiatric diagnoses of schizophrenia, alcohol dependency, major depressive disorder, generalized anxiety disorder, bipolar disorder and substance-induced mood disorder and has a past medical history of HIV, chronic pancreatitis and alcohol withdrawal.   His current presentation of alcohol withdrawals is most consistent with chronic pancreatitis.  Current outpatient psychotropic medications include gabapentin  and Seroquel  and historically he has had a no response to these medications. He was noncompliant  with medications prior to admission as evidenced by self-reported not taking medications as directed. On initial examination, patient flat, guarded and irritable.. Please see plan below for detailed recommendations.   Diagnoses:  Active Hospital problems: Principal Problem:   Alcohol dependence with withdrawal (HCC) Active Problems:   HIV disease (HCC)   Polysubstance abuse (HCC)   Constipation   Alcoholic fatty liver   Chronic pancreatitis (HCC)   Gastritis and duodenitis   Anxiety state   PTSD  (post-traumatic stress disorder)   Severe protein-calorie malnutrition (HCC)    Plan   ## Psychiatric Medication Recommendations:  Continue alcohol detox protocol see chart - May increase Seroquel  25 mg p.o. twice daily to 50 mg p.o. twice daily - May initiate gabapentin  100 mg p.o. 3 times daily (however patient declined)  ## Medical Decision Making Capacity: Not specifically addressed in this encounter  ## Further Work-up:  -- As documented by internal medicine Dr. Marquette Sites, currently strict n.p.o. U/A or UDS -- most recent EKG on 4/19 had QtC of 387 -- Pertinent labwork reviewed earlier this admission includes: Continue to monitor CBC, CMP and lipase   ## Disposition:-- Plan Post Discharge/Psychiatric Care Follow-up resources will place [facility] consult for additional outpatient services for residential treatment  ## Behavioral / Environmental: - No specific recommendations at this time.     ## Safety and Observation Level:  - Based on my clinical evaluation, I estimate the patient to be at moderate risk of self harm in the current setting. - At this time, we recommend  routine. This decision is based on my review of the chart including patient's history and current presentation, interview of the patient, mental status examination, and consideration of suicide risk including evaluating suicidal ideation, plan, intent, suicidal or self-harm behaviors, risk factors, and protective factors. This judgment is based on our ability to directly address suicide risk, implement suicide prevention strategies, and develop a safety plan while the patient is in the clinical setting. Please contact our team if there is a concern that risk level has changed.  CSSR Risk Category:C-SSRS RISK CATEGORY: Error: Q3, 4, or  5 should not be populated when Q2 is No  Suicide Risk Assessment: Patient has following modifiable risk factors for suicide: untreated depression, recklessness, and medication noncompliance,  which we are addressing by offering residential treatment and additional outpatient services for substance abuse. Patient has following non-modifiable or demographic risk factors for suicide: male gender Patient has the following protective factors against suicide: Access to outpatient mental health care  Thank you for this consult request. Recommendations have been communicated to the primary team.  We will outpatient follow-up consideration for residential treatment at this time.   Levester Reagin, NP       History of Present Illness  Relevant Aspects of Hospital ED Course:  Admitted on 02/25/2024 for chronic pancreatitis.   Patient Report:  Gary [name] 38 year old Caucasian male was seen and evaluated face-to-face by this provider.  Initial consult was placed due to suicidal and homicidal ideations.  Gary [name] is currently denying thoughts to harm himself.  Does report homicidal ideation towards 3 friends. " They did me wrong"   Did not disclose names at this time.  Denying plan or intent.  Has a documented history related to patient's induced mood disorder, major depressive disorder, generalized anxiety disorder, opiate dependency disorder.  He reports he is not compliant with medications.  States " not taking it because they do not work," reports last inpatient admission for mental health was 2 months prior.  Reports he was recently discharged from residential treatment facility.  Reports drinking vodka daily.  Chart review BAL on admission less than 10.   During evaluation Gary [name] is resting in bed.  Flat, guarded and blunted.  Presents slightly irritable throughout this assessment.Gary [name] reports mild frustration related to being restricted for food intake.  he is alert/oriented x 3; short abrupt responses and mood flat, congruent with affect.  Patient is speaking in a clear tone at moderate volume, and normal pace; with good eye contact.  His thought process is coherent and relevant;  There is no indication that he is currently responding to internal/external stimuli or experiencing delusional thought content.  Patient denies suicidal ideation, psychosis, and paranoia.  Patient has remained calm throughout assessment and has answered questions appropriately.  Case staffed with attending psychiatrist K, Madaram with chart review   "Gary [name] is a 38 year-old male with a chronic and well-documented history of severe opioid use disorder, recurrent polysubstance use, and maladaptive patterns of emergency room utilization, including frequent presentations across multiple hospital systems for subjective complaints that escalate to requests for high-risk controlled substances (e.g., hydromorphone , alprazolam , quetiapine , promethazine ).  His current presentation is consistent with prior patterns: He presents with vague homicidal ideation that dissipates when offered non-controlled, lower-risk medications. His safety narrative shifts based on whether access to controlled medications is denied, suggesting instrumental or goal-directed ideation (i.e., ideation used as leverage to obtain medications or inpatient status)."   Psych ROS:  Depression: reported history Anxiety: reported history with GAD Mania (lifetime and current): denied Psychosis: (lifetime and current): denied  Collateral information: N/A   Review of Systems  Psychiatric/Behavioral:  Positive for depression and substance abuse. The patient is nervous/anxious.   All other systems reviewed and are negative.    Psychiatric and Social History  Psychiatric History:  Information collected from EHR  Prev Dx/Sx: As documented history related to alcohol abuse, bipolar disorder, HIV, schizophrenia history with seizure disorder Current Psych Provider: Denied Home Meds (current): Denied Previous Med Trials: Gabapentin , Seroquel  denied any other psychotropic medications  currently as he reports they have been ineffective in  the past Therapy: Denied  Prior Psych Hospitalization: Reports previous mental health hospitalizations.  Stated he recently completed residential treatment. Prior Self Harm: " Acute drinking" Prior Violence: Denied  Family Psych History: N/A Family Hx suicide: N/A  Social History:  See chart Access to weapons/lethal means: N/A  Substance History Alcohol: Reported drinking 1 day prior Type of alcohol blocking Last Drink 02/26/2024 Number of drinks per day  History of alcohol withdrawal seizures denied History of DT's history Tobacco: N/A Illicit drugs: N/A Prescription drug abuse: Opiates Rehab hx: States he was recently discharged to residential treatment facility  Exam Findings  Physical Exam:  Vital Signs:  Temp:  [98.1 F (36.7 C)-98.8 F (37.1 C)] 98.2 F (36.8 C) (04/20 0830) Pulse Rate:  [62-88] 72 (04/20 0830) Resp:  [16-18] 17 (04/20 0830) BP: (82-95)/(55-69) 91/69 (04/20 0830) SpO2:  [100 %] 100 % (04/20 0830) Blood pressure 91/69, pulse 72, temperature 98.2 F (36.8 C), temperature source Oral, resp. rate 17, height 6' (1.829 m), weight 63.5 kg, SpO2 100%. Body mass index is 18.99 kg/m.  Physical Exam Vitals and nursing note reviewed.  Constitutional:      Appearance: He is well-developed.  Neurological:     Mental Status: He is alert and oriented to person, place, and time.  Psychiatric:        Mood and Affect: Mood is anxious and depressed.     Mental Status Exam: General Appearance: Disheveled  Orientation:  Full (Time, Place, and Person)  Memory:  Immediate;   Good Recent;   Good  Concentration:  Concentration: Good  Recall:  Good  Attention  Fair  Eye Contact:  Good  Speech:  Clear and Coherent  Language:  Fair  Volume:  Normal  Mood: Flat ,guarded and depressed  Affect:  Congruent  Thought Process:  Coherent  Thought Content:  Logical  Suicidal Thoughts:  No  Homicidal Thoughts:  Yes.  without intent/plan  Judgement:  Fair   Insight:  Fair  Psychomotor Activity:  NA  Akathisia:  NA  Fund of Knowledge:  Fair      Assets:  Manufacturing systems engineer Desire for Improvement Resilience Social Support  Cognition:  WNL  ADL's:  Intact  AIMS (if indicated):        Other History   These have been pulled in through the EMR, reviewed, and updated if appropriate.  Family History:  The patient's family history includes Alcohol abuse in his father and mother; Alcoholism in his mother; Cancer in an other family member; Colon cancer in an other family member; Hypertension in his mother; Other in an other family member.  Medical History: Past Medical History:  Diagnosis Date   Alcohol abuse    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   Anxiety    Bipolar 2 disorder (HCC)    HIV (human immunodeficiency virus infection) (HCC)    Pancreatitis    PTSD (post-traumatic stress disorder)    Schizophrenia (HCC)    Seizures (HCC)    Subdural hematoma (HCC)     Surgical History: Past Surgical History:  Procedure Laterality Date   BIOPSY  04/19/2022   Procedure: BIOPSY;  Surgeon: Normie Becton., MD;  Location: [facility] ENDOSCOPY;  Service: Gastroenterology;;   ENTEROSCOPY N/A 04/19/2022   Procedure: ENTEROSCOPY;  Surgeon: Normie Becton., MD;  Location: [facility] ENDOSCOPY;  Service: Gastroenterology;  Laterality: N/A;   INCISION AND DRAINAGE PERIRECTAL ABSCESS N/A 09/24/2016   Procedure: IRRIGATION AND DEBRIDEMENT PERIRECTAL  ABSCESS;  Surgeon: Gwyndolyn Lerner, MD;  Location: [facility] ORS;  Service: General;  Laterality: N/A;   none       Medications:   Current Facility-Administered Medications:    acetaminophen  (TYLENOL ) tablet 650 mg, 650 mg, Oral, Q6H PRN **OR** acetaminophen  (TYLENOL ) suppository 650 mg, 650 mg, Rectal, Q6H PRN, Cox, Amy N, DO   bictegravir-emtricitabine -tenofovir  AF (BIKTARVY ) 50-200-25 MG per tablet 1 tablet, 1 tablet, Oral, Daily, Cox, Amy N, DO, 1 tablet at 02/26/24 0957   cyanocobalamin   (VITAMIN B12) tablet 1,000 mcg, 1,000 mcg, Oral, Daily, Cox, Amy N, DO, 1,000 mcg at 02/27/24 0853   feeding supplement (ENSURE ENLIVE / ENSURE PLUS) liquid 237 mL, 237 mL, Oral, BID BM, Cox, Amy N, DO, 237 mL at 02/26/24 0958   folic acid  (FOLVITE ) tablet 1 mg, 1 mg, Oral, Daily, Cox, Amy N, DO, 1 mg at 02/27/24 9604   gabapentin  (NEURONTIN ) capsule 400 mg, 400 mg, Oral, TID, Dezii, Alexandra, DO, 400 mg at 02/27/24 0853   heparin  injection 5,000 Units, 5,000 Units, Subcutaneous, Q8H, Cox, Amy N, DO, 5,000 Units at 02/26/24 2102   ketorolac  (TORADOL ) 15 MG/ML injection 15 mg, 15 mg, Intravenous, Q8H PRN, Dezii, Alexandra, DO, 15 mg at 02/26/24 0955   lactated ringers  infusion, , Intravenous, Continuous, Dezii, Alexandra, DO, Last Rate: 125 mL/hr at 02/27/24 1037, New Bag at 02/27/24 1037   LORazepam  (ATIVAN ) injection 1 mg, 1 mg, Intravenous, Q6H PRN, Dezii, Alexandra, DO, 1 mg at 02/26/24 1557   morphine  (PF) 2 MG/ML injection 1 mg, 1 mg, Intravenous, Q4H PRN, Dezii, Alexandra, DO, 1 mg at 02/27/24 0856   multivitamin with minerals tablet 1 tablet, 1 tablet, Oral, Daily, Cox, Amy N, DO, 1 tablet at 02/27/24 5409   ondansetron  (ZOFRAN ) tablet 4 mg, 4 mg, Oral, Q6H PRN **OR** ondansetron  (ZOFRAN ) injection 4 mg, 4 mg, Intravenous, Q6H PRN, Cox, Amy N, DO   QUEtiapine  (SEROQUEL ) tablet 50 mg, 50 mg, Oral, BID, Cox, Amy N, DO, 50 mg at 02/27/24 8119   senna-docusate (Senokot-S) tablet 1 tablet, 1 tablet, Oral, QHS PRN, Cox, Amy N, DO   thiamine  (VITAMIN B1) tablet 100 mg, 100 mg, Oral, Daily, Cox, Amy N, DO, 100 mg at 02/27/24 1478  Allergies: Allergies  Allergen Reactions   Tegretol  [Carbamazepine ] Other (See Comments)    Vertigo  Paralysis   Caffeine Palpitations    Levester Reagin, NP

## 2024-02-28 ENCOUNTER — Other Ambulatory Visit: Payer: Self-pay

## 2024-02-28 DIAGNOSIS — B20 Human immunodeficiency virus [HIV] disease: Secondary | ICD-10-CM | POA: Diagnosis not present

## 2024-02-28 DIAGNOSIS — E43 Unspecified severe protein-calorie malnutrition: Secondary | ICD-10-CM | POA: Diagnosis not present

## 2024-02-28 DIAGNOSIS — F4310 Post-traumatic stress disorder, unspecified: Secondary | ICD-10-CM

## 2024-02-28 DIAGNOSIS — F332 Major depressive disorder, recurrent severe without psychotic features: Secondary | ICD-10-CM | POA: Diagnosis not present

## 2024-02-28 DIAGNOSIS — F411 Generalized anxiety disorder: Secondary | ICD-10-CM | POA: Diagnosis not present

## 2024-02-28 LAB — COMPREHENSIVE METABOLIC PANEL WITH GFR
ALT: 10 U/L (ref 0–44)
AST: 15 U/L (ref 15–41)
Albumin: 2.8 g/dL — ABNORMAL LOW (ref 3.5–5.0)
Alkaline Phosphatase: 40 U/L (ref 38–126)
Anion gap: 5 (ref 5–15)
BUN: 6 mg/dL (ref 6–20)
CO2: 25 mmol/L (ref 22–32)
Calcium: 8.5 mg/dL — ABNORMAL LOW (ref 8.9–10.3)
Chloride: 109 mmol/L (ref 98–111)
Creatinine, Ser: 0.66 mg/dL (ref 0.61–1.24)
GFR, Estimated: >60 mL/min (ref >60)
Glucose, Bld: 99 mg/dL (ref 70–99)
Potassium: 4 mmol/L (ref 3.5–5.1)
Sodium: 139 mmol/L (ref 135–145)
Total Bilirubin: 0.3 mg/dL (ref 0.0–1.2)
Total Protein: 5.6 g/dL — ABNORMAL LOW (ref 6.5–8.1)

## 2024-02-28 LAB — CBC WITH DIFFERENTIAL/PLATELET
Abs Immature Granulocytes: 0 10*3/uL (ref 0.00–0.07)
Basophils Absolute: 0 10*3/uL (ref 0.0–0.1)
Basophils Relative: 1 %
Eosinophils Absolute: 0.1 10*3/uL (ref 0.0–0.5)
Eosinophils Relative: 2 %
HCT: 28.1 % — ABNORMAL LOW (ref 39.0–52.0)
Hemoglobin: 9.4 g/dL — ABNORMAL LOW (ref 13.0–17.0)
Immature Granulocytes: 0 %
Lymphocytes Relative: 42 %
Lymphs Abs: 1.1 10*3/uL (ref 0.7–4.0)
MCH: 30.4 pg (ref 26.0–34.0)
MCHC: 33.5 g/dL (ref 30.0–36.0)
MCV: 90.9 fL (ref 80.0–100.0)
Monocytes Absolute: 0.2 10*3/uL (ref 0.1–1.0)
Monocytes Relative: 9 %
Neutro Abs: 1.2 10*3/uL — ABNORMAL LOW (ref 1.7–7.7)
Neutrophils Relative %: 46 %
Platelets: 172 10*3/uL (ref 150–400)
RBC: 3.09 MIL/uL — ABNORMAL LOW (ref 4.22–5.81)
RDW: 13.6 % (ref 11.5–15.5)
WBC: 2.6 10*3/uL — ABNORMAL LOW (ref 4.0–10.5)
nRBC: 0 % (ref 0.0–0.2)

## 2024-02-28 LAB — MAGNESIUM: Magnesium: 1.9 mg/dL (ref 1.7–2.4)

## 2024-02-28 LAB — PHOSPHORUS: Phosphorus: 3.3 mg/dL (ref 2.5–4.6)

## 2024-02-28 MED ORDER — GABAPENTIN 300 MG PO CAPS
600.0000 mg | ORAL_CAPSULE | Freq: Three times a day (TID) | ORAL | 0 refills | Status: DC
  Filled 2024-02-28: qty 84, 14d supply, fill #0

## 2024-02-28 MED ORDER — MELOXICAM 15 MG PO TABS
15.0000 mg | ORAL_TABLET | Freq: Every day | ORAL | 0 refills | Status: AC
  Filled 2024-02-28: qty 14, 14d supply, fill #0

## 2024-02-28 NOTE — Discharge Summary (Signed)
 Physician Discharge Summary   Patient: Gary [name] MRN: [id number] DOB: 1986-04-24  Admit date:     02/25/2024  Discharge date: 02/28/24  Discharge Physician: Roise Cleaver   PCP: Pcp, No   Recommendations at discharge:   Follow-up with a primary care physician or pain management physician to continue chronic medication management outpatient Follow-up with psychiatry  Discharge Diagnoses: Principal Problem:   Alcohol dependence with withdrawal (HCC) Active Problems:   HIV disease (HCC)   Polysubstance abuse (HCC)   Constipation   Severe episode of recurrent major depressive disorder, without psychotic features (HCC)   Alcoholic fatty liver   Chronic pancreatitis (HCC)   Gastritis and duodenitis   Anxiety state   PTSD (post-traumatic stress disorder)   Severe protein-calorie malnutrition (HCC)  Resolved Problems:   Alcohol withdrawal [facility])  Hospital Course: Gary [name] is 38 y.o. male with HIV, alcohol dependence, schizophrenia, chronic pancreatitis, frequent admissions to the hospital, was discharged on 4/17, went home and drank alcohol, and represented to the ED.  On this admission he is concerned he is experiencing alcohol withdrawals.  He reports he drinks half a gallon of vodka daily, though no has not consumed that volume in weeks as he has been hospitalized. Patient was admitted on CIWA protocol with as needed Ativan .  He did not demonstrate any clinical evidence of withdrawal while admitted, no tachycardia, no tremors.  He did frequently endorse anxiety. Stay was further prolonged by complaints of abdominal pain, and suicidal and homicidal ideation.  There are ongoing concerns of opioid dependence, and medication seeking behaviors.  Psychiatry was consulted.  Multiple conversations were had with the patient.  Please see their documentation for further review. On day of discharge patient was amendable to returning home with prescriptions for NSAIDs and  gabapentin .  Opioid dependence Medication Seeking behaviors - Agree with psychiatry: This patient has a well-documented history of severe opioid use disorder, recurrent polysubstance use, and maladaptive patterns of emergency room utilization, including frequent presentations across multiple hospital systems for subjective complaints that escalate to requests for high-risk controlled substances and refusal of alternative pain medications.   His current presentation is consistent with prior patterns: He presents with vague suicidal and homicidal ideation that dissipates when offered non-controlled, lower-risk medications. His safety narrative shifts based on whether access to controlled medications is denied, suggesting instrumental or goal-directed ideation (i.e., ideation used as leverage to obtain medications or inpatient status).    Alcohol dependence with risk of withdrawal Hyponatremia -- No evidence of alcohol withdrawal during this admission.  Unlikely patient was out of the hospital long enough to experience withdrawals here (presented within 12hrs of discharge) - Patient consistently answers CIWA questions at highest level. No evidence of alcohol withdrawal on clinical exam.  No tremors, no tachycardia.    Chronic pancreatitis - Discharged and returned immediately after admission for acute on chronic pancreatitis.  Patient continues to drink alcohol despite extensive counseling on multiple prior admissions including this one - Lipase 33 on arrival.   -Continue gabapentin  and Mobic  for chronic pain - Follow-up with primary care physician/pain management MD for chronic pain management.    Frequent readmissions - Frequently returns on day of discharge or within 24 hours - Frequent admissions secondary to pancreatitis, alcohol withdrawal, and pain seeking behaviors --TOC consulted for resource management.  Suicidal and homicidal ideation - Psychiatry has been consulted, no need for  inpatient admission at this time.   -- Bedside RN to call security if patient becomes violent  or aggressive    HIV disease - Resume home dose Biktarvy    Severe protein calorie malnutrition secondary to alcohol abuse - Continue with supplements twice daily   Consultants: Psychiatry  Procedures performed:   Disposition: Home Diet recommendation:  Discharge Diet Orders (From admission, onward)     Start     Ordered   02/28/24 0000  Diet general        02/28/24 1214           Regular diet DISCHARGE MEDICATION: Allergies as of 02/28/2024       Reactions   Tegretol  [carbamazepine ] Other (See Comments)   Vertigo  Paralysis   Caffeine Palpitations        Medication List     STOP taking these medications    B-12 1000 MCG Tabs   thiamine  100 MG tablet Commonly known as: VITAMIN B1       TAKE these medications    acetaminophen  325 MG tablet Commonly known as: TYLENOL  Take 2 tablets (650 mg total) by mouth 3 (three) times daily as needed for up to 10 days for mild pain (pain score 1-3), moderate pain (pain score 4-6), headache or fever.   Biktarvy  50-200-25 MG Tabs tablet Generic drug: bictegravir-emtricitabine -tenofovir  AF Take 1 tablet by mouth daily.   gabapentin  300 MG capsule Commonly known as: NEURONTIN  Take 2 capsules (600 mg total) by mouth 3 (three) times daily for 14 days.   meloxicam  15 MG tablet Commonly known as: MOBIC  Take 1 tablet (15 mg total) by mouth daily for 14 days.   QUEtiapine  50 MG tablet Commonly known as: SEROQUEL  Take 1 tablet (50 mg total) by mouth 2 (two) times daily.        Discharge Exam: Filed Weights   02/25/24 1018  Weight: 63.5 kg   General exam: Appears calm and comfortable, he is frail and chronically ill-appearing Respiratory system: No work of breathing, symmetric chest wall expansion Cardiovascular system regular rate and rhythm, no murmurs, no rubs Gastrointestinal system: Abdomen is soft, nondistended,  nontender to palpation Neuro: Alert and oriented. No focal neurological deficits. Extremities: Symmetric, expected ROM Skin: No rashes, lesions Psychiatry: flat. Irritated.  Condition at discharge: stable  The results of significant diagnostics from this hospitalization (including imaging, microbiology, ancillary and laboratory) are listed below for reference.   Imaging Studies: DG Chest Portable 1 View Result Date: 02/13/2024 CLINICAL DATA:  Chest pain. EXAM: PORTABLE CHEST 1 VIEW COMPARISON:  Chest radiograph dated 11/25/2023. FINDINGS: Similar rotation to the left. The heart size and mediastinal contours are unchanged. Similar mild elevation of the left hemidiaphragm. No focal consolidation, pleural effusion, or pneumothorax. No acute osseous abnormality. IMPRESSION: No acute cardiopulmonary findings. Electronically Signed   By: Mannie Seek M.D.   On: 02/13/2024 10:38   US  LIVER DOPPLER Result Date: 02/04/2024 CLINICAL DATA:  Portal vein thrombosis EXAM: DUPLEX ULTRASOUND OF LIVER TECHNIQUE: Color and duplex Doppler ultrasound was performed to evaluate the hepatic in-flow and out-flow vessels. COMPARISON:  CT from previous day FINDINGS: Liver: Normal parenchymal echogenicity. Normal hepatic contour without nodularity. No focal lesion, mass or intrahepatic biliary ductal dilatation. Main Portal Vein size: 2.3 cm Superior mesenteric vein not visualized. Portal Vein Velocities (all hepatopetal): Main Prox:  86 cm/sec Main Mid: 92 cm/sec Main Dist:  89 cm/sec Right: 25 cm/sec Left: 29 cm/sec Hepatic Vein Velocities (all hepatofugal): Right:  13 cm/sec Middle:  15 cm/sec Left:  22 cm/sec IVC: Present and patent with normal respiratory phasicity. Velocity 16 cm/sec. Hepatic  Artery Velocity:  Not identified Splenic Vein Velocity:  23 cm/sec Spleen: 13.5 cm x 13.5 cm x 4.4 cm with a total volume of 420 cm^3 (411 cm^3 is upper limit normal) Portal Vein Occlusion/Thrombus: No Splenic Vein  Occlusion/Thrombus: No Ascites: Trace Varices: None identified IMPRESSION: 1. Patent portal vein with normal directional flow. 2. No hepatic vein thrombosis. 3. Trace ascites. 4. Mild splenomegaly. Electronically Signed   By: Nicoletta Barrier M.D.   On: 02/04/2024 07:47   CT HEAD WO CONTRAST ( ) Result Date: 02/03/2024 CLINICAL DATA:  Altered mental status EXAM: CT HEAD WITHOUT CONTRAST TECHNIQUE: Contiguous axial images were obtained from the base of the skull through the vertex without intravenous contrast. RADIATION DOSE REDUCTION: This exam was performed according to the departmental dose-optimization program which includes automated exposure control, adjustment of the mA and/or kV according to patient size and/or use of iterative reconstruction technique. COMPARISON:  09/16/2023 FINDINGS: Brain: No evidence of acute infarction, hemorrhage, hydrocephalus, extra-axial collection or mass lesion/mass effect. Vascular: No hyperdense vessel or unexpected calcification. Skull: Normal. Negative for fracture or focal lesion. Sinuses/Orbits: No acute finding. Other: None. IMPRESSION: No acute intracranial abnormality noted. Electronically Signed   By: Violeta Grey M.D.   On: 02/03/2024 02:52   CT ABDOMEN PELVIS W CONTRAST Result Date: 02/03/2024 CLINICAL DATA:  Pancreatitis, acute, severe EXAM: CT ABDOMEN AND PELVIS WITH CONTRAST TECHNIQUE: Multidetector CT imaging of the abdomen and pelvis was performed using the standard protocol following bolus administration of intravenous contrast. RADIATION DOSE REDUCTION: This exam was performed according to the departmental dose-optimization program which includes automated exposure control, adjustment of the mA and/or kV according to patient size and/or use of iterative reconstruction technique. CONTRAST:  75mL OMNIPAQUE  IOHEXOL  350 MG/ML SOLN COMPARISON:  CT abdomen pelvis 12/17/2023, CT abdomen pelvis 11/25/2023, CT abdomen pelvis 10/28/2023, CT abdomen pelvis 08/22/2023, CT  abdomen pelvis 05/24/2023 FINDINGS: Lower chest: No acute abnormality. Hepatobiliary: No focal liver abnormality. No gallstones, gallbladder wall thickening, or pericholecystic fluid. No biliary dilatation. Pancreas: Grossly stable 1.6 cm proximal pancreas fluid dense lesion (3:24). Normal pancreatic contour. Interval increase in proximal pancreas peripancreatic free fluid with mass effect on the proximal portal vein with focal marked stenosis (3:22). No main pancreatic ductal dilatation. Spleen: Normal in size without focal abnormality. Adrenals/Urinary Tract: No adrenal nodule bilaterally. Bilateral kidneys enhance symmetrically. No hydronephrosis. No hydroureter. The urinary bladder is unremarkable. Stomach/Bowel: Stomach is within normal limits. Small bowel loops within the lower anterior abdomen dilated with air and fluid measuring up to 3.7 cm in caliber. No definite transition point identified. No evidence of large bowel wall thickening or dilatation. Appendix appears normal. Vascular/Lymphatic: No abdominal aorta or iliac aneurysm. No abdominal, pelvic, or inguinal lymphadenopathy. Reproductive: Prostate enlarged measuring 4.9 cm. Other: Upper abdomen simple free fluid. No intraperitoneal free gas. No organized fluid collection. Musculoskeletal: No abdominal wall hernia or abnormality. No suspicious lytic or blastic osseous lesions. No acute displaced fracture. IMPRESSION: 1. Interval worsening acute pancreatitis. 2. 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 Underlying thrombus not excluded. 3. Small bowel loops within the lower anterior abdomen dilated with air and fluid measuring up to 3.7 cm in caliber. No definite transition point identified. Finding could represent ileus versus partial small bowel obstruction. 4. Prostatomegaly. 5.  Aortic Atherosclerosis (ICD10-I70.0). Electronically Signed   By: Morgane  Naveau  M.D.   On: 02/03/2024 00:00    Microbiology: Results for orders  placed or performed during the hospital encounter of 12/17/23  Gastrointestinal Panel by PCR , Stool     Status: None   Collection Time: 12/17/23 10:26 PM   Specimen: Stool  Result Value Ref Range Status   Campylobacter species NOT DETECTED NOT DETECTED Final   Plesimonas shigelloides NOT DETECTED NOT DETECTED Final   Salmonella species NOT DETECTED NOT DETECTED Final   Yersinia enterocolitica NOT DETECTED NOT DETECTED Final   Vibrio species NOT DETECTED NOT DETECTED Final   Vibrio cholerae NOT DETECTED NOT DETECTED Final   Enteroaggregative E coli (EAEC) NOT DETECTED NOT DETECTED Final   Enteropathogenic E coli (EPEC) NOT DETECTED NOT DETECTED Final   Enterotoxigenic E coli (ETEC) NOT DETECTED NOT DETECTED Final   Shiga like toxin producing E coli (STEC) NOT DETECTED NOT DETECTED Final   Shigella/Enteroinvasive E coli (EIEC) NOT DETECTED NOT DETECTED Final   Cryptosporidium NOT DETECTED NOT DETECTED Final   Cyclospora cayetanensis NOT DETECTED NOT DETECTED Final   Entamoeba histolytica NOT DETECTED NOT DETECTED Final   Giardia lamblia NOT DETECTED NOT DETECTED Final   Adenovirus F40/41 NOT DETECTED NOT DETECTED Final   Astrovirus NOT DETECTED NOT DETECTED Final   Norovirus GI/GII NOT DETECTED NOT DETECTED Final   Rotavirus A NOT DETECTED NOT DETECTED Final   Sapovirus (I, II, IV, and V) NOT DETECTED NOT DETECTED Final    Comment: Performed at [facility], [address] [place], [place] [address]    Labs: CBC: Recent Labs  Lab 02/22/24 0425 02/24/24 1914 02/25/24 1033 02/25/24 1124 02/26/24 0520 02/27/24 0338 02/28/24 0608  WBC 2.3* 5.8 SPECIMEN CONTAMINATED, UNABLE TO PERFORM TEST(S). 3.8* 4.0 2.2* 2.6*  NEUTROABS 1.5* 4.6  --  2.4  --  1.0* 1.2*  HGB 9.6* 13.3 SPECIMEN CONTAMINATED, UNABLE TO PERFORM TEST(S). 10.5* 9.6* 10.4* 9.4*  HCT 29.8* 38.4* SPECIMEN CONTAMINATED, UNABLE TO PERFORM  TEST(S). 33.0* 28.8* 31.4* 28.1*  MCV 97.1 90.4 SPECIMEN CONTAMINATED, UNABLE TO PERFORM TEST(S). 97.9 93.5 92.9 90.9  PLT 133* 265 SPECIMEN CONTAMINATED, UNABLE TO PERFORM TEST(S). 210 176 163 172   Basic Metabolic Panel: Recent Labs  Lab 02/24/24 1914 02/25/24 1148 02/25/24 1501 02/26/24 0520 02/27/24 0338 02/28/24 0608  NA 139 133*  --  133* 137 139  K 4.2 3.7  --  4.4 4.1 4.0  CL 94* 100  --  104 107 109  CO2 28 22  --  23 25 25   GLUCOSE 124* 99  --  110* 89 99  BUN 8 15  --  12 8 6   CREATININE 1.01 0.89  --  0.77 0.71 0.66  CALCIUM  11.5* 9.0  --  8.4* 9.0 8.5*  MG  --  2.2  --   --  1.9 1.9  PHOS  --   --  3.7  --  3.4 3.3   Liver Function Tests: Recent Labs  Lab 02/24/24 1914 02/25/24 1148 02/27/24 0338 02/28/24 0608  AST 20 19 12* 15  ALT 13 13 11 10   ALKPHOS 65 54 44 40  BILITOT 0.8 0.9 0.5 0.3  PROT 8.3* 7.7 6.1* 5.6*  ALBUMIN 4.2 4.1 3.2* 2.8*   CBG: Recent Labs  Lab 02/21/24 1832 02/21/24 1833 02/21/24 1858  GLUCAP 46* 55* 115*    Discharge time spent: 31 minutes.  Signed: Ramya Vanbergen, DO [facility] 02/28/2024

## 2024-02-28 NOTE — Progress Notes (Signed)
 Received communication from [employer], Paramedic with [employer]. She went to [facility] to meet with Baker Bon on 4/17 and discussed case management services with him and provided him with her contact info.   Michiah Mudry, BSN, RN

## 2024-02-28 NOTE — TOC Progression Note (Signed)
 Transition of Care [facility]) - Progression Note    Patient Details  Name: Gary [name] MRN: [id number] Date of Birth: 17-Jul-1986  [facility] CM/SW Contact  Arminda Landmark, RN Phone Number: 02/28/2024, 1:03 PM  Clinical Narrative:    Patient to be discharged back to the streets and is ok with this. Resources added to AVS to find a PCP. Also discussed him finding a pain management doctor for his chronic pancreatitis and he verbalized understanding. No other DC needs identified.      Barriers to Discharge: No Barriers Identified  Expected Discharge Plan and Services         Expected Discharge Date: 02/28/24               DME Arranged:  (none needed)                     Social Determinants of Health (SDOH) Interventions SDOH Screenings   Food Insecurity: No Food Insecurity (02/25/2024)  Recent Concern: Food Insecurity - Food Insecurity Present (02/19/2024)  Housing: Patient Declined (02/25/2024)  Recent Concern: Housing - High Risk (02/19/2024)  Transportation Needs: Patient Declined (02/25/2024)  Recent Concern: Transportation Needs - Unmet Transportation Needs (02/19/2024)  Utilities: Patient Declined (02/25/2024)  Alcohol Screen: High Risk (07/25/2021)  Depression (PHQ2-9): Medium Risk (01/28/2024)  Financial Resource Strain: Patient Declined (10/11/2023)  Social Connections: Patient Declined (02/25/2024)  Recent Concern: Social Connections - Socially Isolated (02/03/2024)  Stress: No Stress Concern Present (06/30/2022)   Received from [facility]  Recent Concern: Stress - Stress Concern Present (06/25/2022)   Received from [facility]  Tobacco Use: High Risk (02/25/2024)    Readmission Risk Interventions    02/14/2024    3:10 PM 12/15/2023   11:23 AM 11/29/2023   10:27 AM  Readmission Risk Prevention Plan  Transportation Screening Complete Complete Complete  Medication Review Oceanographer) Complete Complete Complete  PCP or  Specialist appointment within 3-5 days of discharge Patient refused Complete   HRI or Home Care Consult Complete Complete Complete  SW Recovery Care/Counseling Consult Complete Complete Complete  Palliative Care Screening Not Applicable Not Applicable Not Applicable  Skilled Nursing Facility Not Applicable Not Applicable Not Applicable

## 2024-02-28 NOTE — Discharge Instructions (Signed)
 Some PCP options in [place] area- not a comprehensive list  [facility]- [phone] [facility]- [phone] [facility]- [phone] [facility]- [phone] [facility]- [phone] [facility]- [phone]  or [facility] Physician Referral Line [phone]

## 2024-03-01 ENCOUNTER — Emergency Department: Payer: MEDICAID

## 2024-03-01 ENCOUNTER — Observation Stay: Payer: MEDICAID

## 2024-03-01 ENCOUNTER — Encounter: Payer: Self-pay | Admitting: Emergency Medicine

## 2024-03-01 ENCOUNTER — Inpatient Hospital Stay
Admission: EM | Admit: 2024-03-01 | Discharge: 2024-03-05 | DRG: 438 | Disposition: A | Discharge status: Home / Self Care | Payer: MEDICAID | Attending: Internal Medicine | Admitting: Internal Medicine

## 2024-03-01 ENCOUNTER — Other Ambulatory Visit: Payer: Self-pay

## 2024-03-01 DIAGNOSIS — Z888 Allergy status to other drugs, medicaments and biological substances status: Secondary | ICD-10-CM

## 2024-03-01 DIAGNOSIS — Z79899 Other long term (current) drug therapy: Secondary | ICD-10-CM | POA: Diagnosis not present

## 2024-03-01 DIAGNOSIS — F209 Schizophrenia, unspecified: Secondary | ICD-10-CM | POA: Diagnosis present

## 2024-03-01 DIAGNOSIS — B20 Human immunodeficiency virus [HIV] disease: Secondary | ICD-10-CM | POA: Diagnosis present

## 2024-03-01 DIAGNOSIS — F17210 Nicotine dependence, cigarettes, uncomplicated: Secondary | ICD-10-CM | POA: Diagnosis present

## 2024-03-01 DIAGNOSIS — Z5982 Transportation insecurity: Secondary | ICD-10-CM

## 2024-03-01 DIAGNOSIS — K2970 Gastritis, unspecified, without bleeding: Secondary | ICD-10-CM | POA: Diagnosis present

## 2024-03-01 DIAGNOSIS — Z5902 Unsheltered homelessness: Secondary | ICD-10-CM | POA: Diagnosis not present

## 2024-03-01 DIAGNOSIS — K8591 Acute pancreatitis with uninfected necrosis, unspecified: Secondary | ICD-10-CM | POA: Diagnosis present

## 2024-03-01 DIAGNOSIS — Z681 Body mass index (BMI) 19 or less, adult: Secondary | ICD-10-CM | POA: Diagnosis not present

## 2024-03-01 DIAGNOSIS — K861 Other chronic pancreatitis: Secondary | ICD-10-CM | POA: Diagnosis present

## 2024-03-01 DIAGNOSIS — Z811 Family history of alcohol abuse and dependence: Secondary | ICD-10-CM

## 2024-03-01 DIAGNOSIS — Z8249 Family history of ischemic heart disease and other diseases of the circulatory system: Secondary | ICD-10-CM | POA: Diagnosis not present

## 2024-03-01 DIAGNOSIS — Z21 Asymptomatic human immunodeficiency virus [HIV] infection status: Secondary | ICD-10-CM | POA: Diagnosis not present

## 2024-03-01 DIAGNOSIS — N179 Acute kidney failure, unspecified: Secondary | ICD-10-CM | POA: Diagnosis present

## 2024-03-01 DIAGNOSIS — R112 Nausea with vomiting, unspecified: Secondary | ICD-10-CM

## 2024-03-01 DIAGNOSIS — Z9189 Other specified personal risk factors, not elsewhere classified: Secondary | ICD-10-CM

## 2024-03-01 DIAGNOSIS — F4310 Post-traumatic stress disorder, unspecified: Secondary | ICD-10-CM | POA: Diagnosis present

## 2024-03-01 DIAGNOSIS — K567 Ileus, unspecified: Secondary | ICD-10-CM | POA: Diagnosis present

## 2024-03-01 DIAGNOSIS — K863 Pseudocyst of pancreas: Principal | ICD-10-CM | POA: Diagnosis present

## 2024-03-01 DIAGNOSIS — Z765 Malingerer [conscious simulation]: Secondary | ICD-10-CM | POA: Diagnosis not present

## 2024-03-01 DIAGNOSIS — Z809 Family history of malignant neoplasm, unspecified: Secondary | ICD-10-CM

## 2024-03-01 DIAGNOSIS — F1010 Alcohol abuse, uncomplicated: Secondary | ICD-10-CM

## 2024-03-01 DIAGNOSIS — E43 Unspecified severe protein-calorie malnutrition: Secondary | ICD-10-CM | POA: Diagnosis present

## 2024-03-01 DIAGNOSIS — K2980 Duodenitis without bleeding: Secondary | ICD-10-CM | POA: Diagnosis present

## 2024-03-01 DIAGNOSIS — K859 Acute pancreatitis without necrosis or infection, unspecified: Principal | ICD-10-CM | POA: Diagnosis present

## 2024-03-01 DIAGNOSIS — Z5941 Food insecurity: Secondary | ICD-10-CM

## 2024-03-01 DIAGNOSIS — K56609 Unspecified intestinal obstruction, unspecified as to partial versus complete obstruction: Secondary | ICD-10-CM | POA: Diagnosis present

## 2024-03-01 DIAGNOSIS — Z1152 Encounter for screening for COVID-19: Secondary | ICD-10-CM

## 2024-03-01 DIAGNOSIS — F1910 Other psychoactive substance abuse, uncomplicated: Secondary | ICD-10-CM | POA: Diagnosis present

## 2024-03-01 DIAGNOSIS — F3181 Bipolar II disorder: Secondary | ICD-10-CM | POA: Diagnosis present

## 2024-03-01 DIAGNOSIS — K2990 Gastroduodenitis, unspecified, without bleeding: Secondary | ICD-10-CM | POA: Diagnosis present

## 2024-03-01 DIAGNOSIS — R64 Cachexia: Secondary | ICD-10-CM | POA: Diagnosis present

## 2024-03-01 DIAGNOSIS — R1084 Generalized abdominal pain: Secondary | ICD-10-CM

## 2024-03-01 DIAGNOSIS — Z9102 Food additives allergy status: Secondary | ICD-10-CM

## 2024-03-01 DIAGNOSIS — I959 Hypotension, unspecified: Secondary | ICD-10-CM

## 2024-03-01 DIAGNOSIS — R579 Shock, unspecified: Secondary | ICD-10-CM | POA: Diagnosis present

## 2024-03-01 DIAGNOSIS — Z800 Family history of malignant neoplasm of digestive organs: Secondary | ICD-10-CM

## 2024-03-01 DIAGNOSIS — F10239 Alcohol dependence with withdrawal, unspecified: Secondary | ICD-10-CM | POA: Diagnosis present

## 2024-03-01 DIAGNOSIS — Z560 Unemployment, unspecified: Secondary | ICD-10-CM

## 2024-03-01 DIAGNOSIS — F332 Major depressive disorder, recurrent severe without psychotic features: Secondary | ICD-10-CM | POA: Diagnosis present

## 2024-03-01 LAB — COMPREHENSIVE METABOLIC PANEL WITH GFR
ALT: 13 U/L (ref 0–44)
ALT: 10 U/L (ref 0–44)
AST: 22 U/L (ref 15–41)
AST: 11 U/L — ABNORMAL LOW (ref 15–41)
Albumin: 5.2 g/dL — ABNORMAL HIGH (ref 3.5–5.0)
Albumin: 2.8 g/dL — ABNORMAL LOW (ref 3.5–5.0)
Alkaline Phosphatase: 75 U/L (ref 38–126)
Alkaline Phosphatase: 41 U/L (ref 38–126)
Anion gap: 15 (ref 5–15)
Anion gap: 5 (ref 5–15)
BUN: 10 mg/dL (ref 6–20)
BUN: 6 mg/dL (ref 6–20)
CO2: 23 mmol/L (ref 22–32)
CO2: 20 mmol/L — ABNORMAL LOW (ref 22–32)
Calcium: 10.5 mg/dL — ABNORMAL HIGH (ref 8.9–10.3)
Calcium: 8.2 mg/dL — ABNORMAL LOW (ref 8.9–10.3)
Chloride: 98 mmol/L (ref 98–111)
Chloride: 112 mmol/L — ABNORMAL HIGH (ref 98–111)
Creatinine, Ser: 0.9 mg/dL (ref 0.61–1.24)
Creatinine, Ser: 0.61 mg/dL (ref 0.61–1.24)
GFR, Estimated: >60 mL/min (ref >60)
GFR, Estimated: >60 mL/min (ref >60)
Glucose, Bld: 93 mg/dL (ref 70–99)
Glucose, Bld: 92 mg/dL (ref 70–99)
Potassium: 3.7 mmol/L (ref 3.5–5.1)
Potassium: 4 mmol/L (ref 3.5–5.1)
Sodium: 136 mmol/L (ref 135–145)
Sodium: 137 mmol/L (ref 135–145)
Total Bilirubin: 1.2 mg/dL (ref 0.0–1.2)
Total Bilirubin: 0.5 mg/dL (ref 0.0–1.2)
Total Protein: 9.4 g/dL — ABNORMAL HIGH (ref 6.5–8.1)
Total Protein: 5.4 g/dL — ABNORMAL LOW (ref 6.5–8.1)

## 2024-03-01 LAB — URINE DRUG SCREEN, QUALITATIVE (ARMC ONLY)
Amphetamines, Ur Screen: NOT DETECTED
Barbiturates, Ur Screen: NOT DETECTED
Benzodiazepine, Ur Scrn: NOT DETECTED
Cannabinoid 50 Ng, Ur ~~LOC~~: NOT DETECTED
Cocaine Metabolite,Ur ~~LOC~~: NOT DETECTED
MDMA (Ecstasy)Ur Screen: NOT DETECTED
Methadone Scn, Ur: NOT DETECTED
Opiate, Ur Screen: NOT DETECTED
Phencyclidine (PCP) Ur S: NOT DETECTED
Tricyclic, Ur Screen: NOT DETECTED

## 2024-03-01 LAB — URINALYSIS, W/ REFLEX TO CULTURE (INFECTION SUSPECTED)
Bacteria, UA: NONE SEEN
Bilirubin Urine: NEGATIVE
Glucose, UA: NEGATIVE mg/dL
Hgb urine dipstick: NEGATIVE
Ketones, ur: NEGATIVE mg/dL
Leukocytes,Ua: NEGATIVE
Nitrite: NEGATIVE
Protein, ur: NEGATIVE mg/dL
Specific Gravity, Urine: 1.004 — ABNORMAL LOW (ref 1.005–1.030)
Squamous Epithelial / HPF: 0 /HPF (ref 0–5)
WBC, UA: 0 WBC/hpf (ref 0–5)
pH: 6 (ref 5.0–8.0)

## 2024-03-01 LAB — GASTROINTESTINAL PANEL BY PCR, STOOL (REPLACES STOOL CULTURE)

## 2024-03-01 LAB — LACTIC ACID, PLASMA
Lactic Acid, Venous: 2.5 mmol/L (ref 0.5–1.9)
Lactic Acid, Venous: 2.4 mmol/L (ref 0.5–1.9)
Lactic Acid, Venous: 1.8 mmol/L (ref 0.5–1.9)
Lactic Acid, Venous: 2.6 mmol/L (ref 0.5–1.9)

## 2024-03-01 LAB — RESP PANEL BY RT-PCR (RSV, FLU A&B, COVID)  RVPGX2
Influenza A by PCR: NEGATIVE
Influenza B by PCR: NEGATIVE
Resp Syncytial Virus by PCR: NEGATIVE
SARS Coronavirus 2 by RT PCR: NEGATIVE

## 2024-03-01 LAB — LIPASE, BLOOD: Lipase: 35 U/L (ref 11–51)

## 2024-03-01 LAB — CBC WITH DIFFERENTIAL/PLATELET
Abs Immature Granulocytes: 0.01 10*3/uL (ref 0.00–0.07)
Basophils Absolute: 0.1 10*3/uL (ref 0.0–0.1)
Basophils Relative: 1 %
Eosinophils Absolute: 0.1 10*3/uL (ref 0.0–0.5)
Eosinophils Relative: 2 %
HCT: 40.4 % (ref 39.0–52.0)
Hemoglobin: 13.7 g/dL (ref 13.0–17.0)
Immature Granulocytes: 0 %
Lymphocytes Relative: 38 %
Lymphs Abs: 2.2 10*3/uL (ref 0.7–4.0)
MCH: 30.8 pg (ref 26.0–34.0)
MCHC: 33.9 g/dL (ref 30.0–36.0)
MCV: 90.8 fL (ref 80.0–100.0)
Monocytes Absolute: 0.3 10*3/uL (ref 0.1–1.0)
Monocytes Relative: 6 %
Neutro Abs: 3.1 10*3/uL (ref 1.7–7.7)
Neutrophils Relative %: 53 %
Platelets: 446 10*3/uL — ABNORMAL HIGH (ref 150–400)
RBC: 4.45 MIL/uL (ref 4.22–5.81)
RDW: 13.6 % (ref 11.5–15.5)
WBC: 5.8 10*3/uL (ref 4.0–10.5)
nRBC: 0 % (ref 0.0–0.2)

## 2024-03-01 LAB — PROTIME-INR
INR: 1.1 (ref 0.8–1.2)
Prothrombin Time: 14.1 s (ref 11.4–15.2)

## 2024-03-01 LAB — PROCALCITONIN: Procalcitonin: <0.1 ng/mL

## 2024-03-01 LAB — C DIFFICILE QUICK SCREEN W PCR REFLEX
C Diff antigen: NEGATIVE
C Diff interpretation: NOT DETECTED
C Diff toxin: NEGATIVE

## 2024-03-01 LAB — CORTISOL: Cortisol, Plasma: 8.3 ug/dL

## 2024-03-01 LAB — ETHANOL: Alcohol, Ethyl (B): <15 mg/dL (ref <15)

## 2024-03-01 LAB — MAGNESIUM: Magnesium: 1.6 mg/dL — ABNORMAL LOW (ref 1.7–2.4)

## 2024-03-01 LAB — PHOSPHORUS: Phosphorus: 3.6 mg/dL (ref 2.5–4.6)

## 2024-03-01 MED ORDER — THIAMINE HCL 100 MG/ML IJ SOLN
100.0000 mg | Freq: Once | INTRAMUSCULAR | Status: AC
  Administered 2024-03-01: 100 mg via INTRAVENOUS
  Filled 2024-03-01: qty 2

## 2024-03-01 MED ORDER — MAGNESIUM SULFATE 4 GM/100ML IV SOLN
4.0000 g | Freq: Once | INTRAVENOUS | Status: AC
  Administered 2024-03-01: 4 g via INTRAVENOUS
  Filled 2024-03-01: qty 100

## 2024-03-01 MED ORDER — PANCRELIPASE (LIP-PROT-AMYL) 12000-38000 UNITS PO CPEP
24000.0000 [IU] | ORAL_CAPSULE | Freq: Three times a day (TID) | ORAL | Status: DC
  Administered 2024-03-01 – 2024-03-05 (×13): 24000 [IU] via ORAL
  Filled 2024-03-01 (×14): qty 2

## 2024-03-01 MED ORDER — SODIUM CHLORIDE 0.9 % IV SOLN
2.0000 g | INTRAVENOUS | Status: AC
  Administered 2024-03-01 – 2024-03-05 (×5): 2 g via INTRAVENOUS
  Filled 2024-03-01 (×5): qty 20

## 2024-03-01 MED ORDER — MIDODRINE HCL 5 MG PO TABS
10.0000 mg | ORAL_TABLET | Freq: Three times a day (TID) | ORAL | Status: DC
  Administered 2024-03-01 – 2024-03-05 (×13): 10 mg via ORAL
  Filled 2024-03-01 (×12): qty 2

## 2024-03-01 MED ORDER — ADULT MULTIVITAMIN W/MINERALS CH
1.0000 | ORAL_TABLET | Freq: Every day | ORAL | Status: DC
  Administered 2024-03-01 – 2024-03-05 (×5): 1 via ORAL
  Filled 2024-03-01 (×5): qty 1

## 2024-03-01 MED ORDER — ONDANSETRON HCL 4 MG/2ML IJ SOLN: 4.0000 mg | Freq: Three times a day (TID) | INTRAMUSCULAR | Status: DC | PRN

## 2024-03-01 MED ORDER — SODIUM CHLORIDE 0.9 % IV SOLN: INTRAVENOUS | Status: DC

## 2024-03-01 MED ORDER — ENOXAPARIN SODIUM 40 MG/0.4ML IJ SOSY
40.0000 mg | PREFILLED_SYRINGE | INTRAMUSCULAR | Status: DC
  Administered 2024-03-01 – 2024-03-03 (×3): 40 mg via SUBCUTANEOUS
  Filled 2024-03-01 (×5): qty 0.4

## 2024-03-01 MED ORDER — CHLORDIAZEPOXIDE HCL 5 MG PO CAPS
10.0000 mg | ORAL_CAPSULE | Freq: Four times a day (QID) | ORAL | Status: DC
  Administered 2024-03-01 – 2024-03-03 (×10): 10 mg via ORAL
  Filled 2024-03-01 (×10): qty 2

## 2024-03-01 MED ORDER — SODIUM CHLORIDE 0.9 % IV SOLN
2.0000 g | Freq: Once | INTRAVENOUS | Status: AC
  Administered 2024-03-01: 2 g via INTRAVENOUS
  Filled 2024-03-01: qty 12.5

## 2024-03-01 MED ORDER — FENTANYL CITRATE PF 50 MCG/ML IJ SOSY
100.0000 ug | PREFILLED_SYRINGE | Freq: Once | INTRAMUSCULAR | Status: AC
  Administered 2024-03-01: 100 ug via INTRAVENOUS
  Filled 2024-03-01: qty 2

## 2024-03-01 MED ORDER — THIAMINE HCL 100 MG/ML IJ SOLN
100.0000 mg | Freq: Every day | INTRAMUSCULAR | Status: DC
  Administered 2024-03-03 – 2024-03-05 (×2): 100 mg via INTRAVENOUS
  Filled 2024-03-01 (×3): qty 2

## 2024-03-01 MED ORDER — FENTANYL CITRATE PF 50 MCG/ML IJ SOSY
100.0000 ug | PREFILLED_SYRINGE | INTRAMUSCULAR | Status: AC | PRN
  Administered 2024-03-01 (×5): 100 ug via INTRAVENOUS
  Filled 2024-03-01 (×5): qty 2

## 2024-03-01 MED ORDER — FOLIC ACID 5 MG/ML IJ SOLN
1.0000 mg | Freq: Once | INTRAMUSCULAR | Status: AC
  Administered 2024-03-01: 1 mg via INTRAVENOUS
  Filled 2024-03-01: qty 0.2

## 2024-03-01 MED ORDER — LACTATED RINGERS IV BOLUS (SEPSIS)
1000.0000 mL | Freq: Once | INTRAVENOUS | Status: AC
  Administered 2024-03-01: 1000 mL via INTRAVENOUS

## 2024-03-01 MED ORDER — SODIUM CHLORIDE 0.9 % IV SOLN: 1.0000 mg | Freq: Once | INTRAVENOUS | Status: DC

## 2024-03-01 MED ORDER — VANCOMYCIN HCL IN DEXTROSE 1-5 GM/200ML-% IV SOLN
1000.0000 mg | Freq: Once | INTRAVENOUS | Status: AC
  Administered 2024-03-01: 1000 mg via INTRAVENOUS
  Filled 2024-03-01: qty 200

## 2024-03-01 MED ORDER — LACTATED RINGERS IV BOLUS
1000.0000 mL | Freq: Once | INTRAVENOUS | Status: AC
  Administered 2024-03-01: 1000 mL via INTRAVENOUS

## 2024-03-01 MED ORDER — ONDANSETRON HCL 4 MG/2ML IJ SOLN: 4.0000 mg | Freq: Four times a day (QID) | INTRAMUSCULAR | Status: DC | PRN

## 2024-03-01 MED ORDER — THIAMINE MONONITRATE 100 MG PO TABS
100.0000 mg | ORAL_TABLET | Freq: Every day | ORAL | Status: DC
  Administered 2024-03-01 – 2024-03-04 (×3): 100 mg via ORAL
  Filled 2024-03-01 (×4): qty 1

## 2024-03-01 MED ORDER — LACTATED RINGERS IV SOLN
INTRAVENOUS | Status: AC
Start: 2024-03-01 — End: 2024-03-01

## 2024-03-01 MED ORDER — HYDROMORPHONE HCL 1 MG/ML IJ SOLN
0.5000 mg | INTRAMUSCULAR | Status: DC | PRN
  Administered 2024-03-02 – 2024-03-03 (×4): 0.5 mg via INTRAVENOUS
  Filled 2024-03-01 (×4): qty 1

## 2024-03-01 MED ORDER — LACTATED RINGERS IV BOLUS: 1000.0000 mL | Freq: Once | INTRAVENOUS | Status: DC

## 2024-03-01 MED ORDER — IOHEXOL 300 MG/ML  SOLN
100.0000 mL | Freq: Once | INTRAMUSCULAR | Status: AC | PRN
  Administered 2024-03-01: 100 mL via INTRAVENOUS

## 2024-03-01 MED ORDER — METRONIDAZOLE 500 MG/100ML IV SOLN
500.0000 mg | Freq: Once | INTRAVENOUS | Status: AC
  Administered 2024-03-01: 500 mg via INTRAVENOUS
  Filled 2024-03-01: qty 100

## 2024-03-01 MED ORDER — NICOTINE 14 MG/24HR TD PT24
14.0000 mg | MEDICATED_PATCH | Freq: Every day | TRANSDERMAL | Status: DC
  Administered 2024-03-01 – 2024-03-02 (×2): 14 mg via TRANSDERMAL
  Filled 2024-03-01 (×5): qty 1

## 2024-03-01 MED ORDER — SODIUM CHLORIDE 0.9 % IV BOLUS (SEPSIS): 1000.0000 mL | Freq: Once | INTRAVENOUS | Status: DC

## 2024-03-01 MED ORDER — FOLIC ACID 1 MG PO TABS
1.0000 mg | ORAL_TABLET | Freq: Every day | ORAL | Status: DC
  Administered 2024-03-01 – 2024-03-05 (×5): 1 mg via ORAL
  Filled 2024-03-01 (×5): qty 1

## 2024-03-01 MED ORDER — SODIUM CHLORIDE 0.9 % IV SOLN: 12.5000 mg | Freq: Four times a day (QID) | INTRAVENOUS | Status: DC | PRN

## 2024-03-01 MED ORDER — ONDANSETRON HCL 4 MG/2ML IJ SOLN
4.0000 mg | Freq: Once | INTRAMUSCULAR | Status: AC
  Administered 2024-03-01: 4 mg via INTRAVENOUS
  Filled 2024-03-01: qty 2

## 2024-03-01 MED ORDER — ONDANSETRON HCL 4 MG PO TABS: 4.0000 mg | ORAL_TABLET | Freq: Four times a day (QID) | ORAL | Status: DC | PRN

## 2024-03-01 MED ORDER — PANTOPRAZOLE SODIUM 40 MG IV SOLR
40.0000 mg | Freq: Two times a day (BID) | INTRAVENOUS | Status: DC
  Administered 2024-03-01 – 2024-03-03 (×6): 40 mg via INTRAVENOUS
  Filled 2024-03-01 (×6): qty 10

## 2024-03-01 MED ORDER — GADOBUTROL 1 MMOL/ML IV SOLN
6.0000 mL | Freq: Once | INTRAVENOUS | Status: AC | PRN
  Administered 2024-03-01: 6 mL via INTRAVENOUS

## 2024-03-01 MED ORDER — MIDODRINE HCL 5 MG PO TABS
5.0000 mg | ORAL_TABLET | Freq: Once | ORAL | Status: AC
  Administered 2024-03-01: 5 mg via ORAL
  Filled 2024-03-01: qty 1

## 2024-03-01 MED ORDER — MORPHINE SULFATE (PF) 4 MG/ML IV SOLN: 4.0000 mg | Freq: Once | INTRAVENOUS | Status: DC

## 2024-03-01 MED ORDER — METRONIDAZOLE 500 MG/100ML IV SOLN
500.0000 mg | Freq: Two times a day (BID) | INTRAVENOUS | Status: AC
  Administered 2024-03-01 – 2024-03-05 (×9): 500 mg via INTRAVENOUS
  Filled 2024-03-01 (×9): qty 100

## 2024-03-01 NOTE — Assessment & Plan Note (Signed)
Urine drug screen pending

## 2024-03-01 NOTE — Sepsis Progress Note (Signed)
 Elink monitoring for the code sepsis protocol.

## 2024-03-01 NOTE — Assessment & Plan Note (Signed)
 Nicotine patch

## 2024-03-01 NOTE — Assessment & Plan Note (Signed)
 Cont home biktarvy 

## 2024-03-01 NOTE — Assessment & Plan Note (Addendum)
 Recurrent episodes of pancreatitis in setting of heavy ETOH use  CT imaging today w/ pancreatitis and new complex pancreatic pseudocyst LFTs stable  NPO in setting of SBO Pain control  IVF hydration MRI abdomen pending to better assess pseudocyst  WBC grossly stable  Started on broad spectrum antibiotics in the ER- will continue for now  Consider GI consultation as appropriate

## 2024-03-01 NOTE — ED Notes (Signed)
 This RN gave report to [place] and performed care handoff. Call light in reach, bed wheels locked, side rail raised, pt updated on plan of care. Rounding completed.

## 2024-03-01 NOTE — Assessment & Plan Note (Addendum)
 Malingering Baseline history of Frequent admissions secondary to pancreatitis, alcohol withdrawal, and pain seeking behaviors.  Last admission 4/18-4/21 Noted concurrent acute on chronic pancreatitis with pancreatic pseudocyst Minimize narcotics as appropriate Follow closely

## 2024-03-01 NOTE — Progress Notes (Signed)
       CROSS COVER NOTE  NAME: Gary [name] MRN: [id number] DOB : 08/07/1986 ATTENDING PHYSICIAN: Corrinne Din, MD    Date of Service   03/01/2024   HPI/Events of Note   Nurse reports critical lactate of 2.6 Review of HPI/labs Prior lactate 1.8 Persistent borderline hypotension and bradycardia in patient with known alcoholism on 4th admission of pancreatitis and SBO current CT also notes new complex pseudocyst formation (per sugeon eval not infection related and may need tertiary care for advanced GI endoscopy evaluation); small bowel dilitation, SBO vs ileus (surgery eval reports as eileus as no trnasiion point identified), urinary bladder wall thickening, colonic civerticulitis.  Last CMP seems to be inaccurate as albumin went from 1.8 to 5.2 ? Was seen by Dr Aleskerov for circulatory shock and believes his hypotension and bradycardia are due to his low BMI  Interventions   Assessment/Plan:  X X X

## 2024-03-01 NOTE — ED Provider Notes (Signed)
 [facility] Provider Note    Event Date/Time   First MD Initiated Contact with Patient 03/01/24 0022     (approximate)   History   Abdominal Pain   HPI  Gary [name] is a 38 y.o. male with history of recurrent alcohol induced pancreatitis, HIV, schizophrenia, PTSD, bipolar disorder who presents to the emergency department complaints of generalized abdominal pain, nausea, vomiting, diarrhea.  Reports chills but no known fevers.  No chest pain, shortness of breath.  Patient was recently admitted to the hospital and states he feels like he was discharged too early.  States last alcohol use was 4 to 5 days ago.  He states he is having so much diarrhea that he is incontinent of stool.  Has not been on antibiotics recently.  Denies prior history of C. difficile.  No urinary symptoms.  Denies prior abdominal surgery.  States it is hard to tell if this is similar to his prior episodes of pancreatitis.   History provided by patient, brothers.    Past Medical History:  Diagnosis Date   Alcohol abuse    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   Anxiety    Bipolar 2 disorder (HCC)    HIV (human immunodeficiency virus infection) (HCC)    Pancreatitis    PTSD (post-traumatic stress disorder)    Schizophrenia (HCC)    Seizures (HCC)    Subdural hematoma (HCC)     Past Surgical History:  Procedure Laterality Date   BIOPSY  04/19/2022   Procedure: BIOPSY;  Surgeon: Normie Becton., MD;  Location: [facility] ENDOSCOPY;  Service: Gastroenterology;;   ENTEROSCOPY N/A 04/19/2022   Procedure: ENTEROSCOPY;  Surgeon: Normie Becton., MD;  Location: [facility] ENDOSCOPY;  Service: Gastroenterology;  Laterality: N/A;   INCISION AND DRAINAGE PERIRECTAL ABSCESS N/A 09/24/2016   Procedure: IRRIGATION AND DEBRIDEMENT PERIRECTAL ABSCESS;  Surgeon: Gwyndolyn Lerner, MD;  Location: [facility] ORS;  Service: General;  Laterality: N/A;   none      MEDICATIONS:  Prior to Admission  medications   Medication Sig Start Date End Date Taking? Authorizing Provider  acetaminophen  (TYLENOL ) 325 MG tablet Take 2 tablets (650 mg total) by mouth 3 (three) times daily as needed for up to 10 days for mild pain (pain score 1-3), moderate pain (pain score 4-6), headache or fever. Patient not taking: Reported on 02/25/2024 02/24/24 03/05/24  Althia Atlas, MD  bictegravir-emtricitabine -tenofovir  AF (BIKTARVY ) 50-200-25 MG TABS tablet Take 1 tablet by mouth daily. 02/24/24   Althia Atlas, MD  gabapentin  (NEURONTIN ) 300 MG capsule Take 2 capsules (600 mg total) by mouth 3 (three) times daily for 14 days. 02/28/24 03/13/24  Dezii, Alexandra, DO  meloxicam  (MOBIC ) 15 MG tablet Take 1 tablet (15 mg total) by mouth daily for 14 days. 02/28/24 03/13/24  Dezii, Alexandra, DO  QUEtiapine  (SEROQUEL ) 50 MG tablet Take 1 tablet (50 mg total) by mouth 2 (two) times daily. 02/24/24   Althia Atlas, MD  famotidine  (PEPCID ) 20 MG tablet Take 1 tablet (20 mg total) by mouth 2 (two) times daily. Patient not taking: Reported on 06/01/2019 05/14/19 06/02/19  Mozell Arias, MD    Physical Exam   Triage Vital Signs: ED Triage Vitals  Encounter Vitals Group     BP 03/01/24 0026 (!) 85/61     Systolic BP Percentile --      Diastolic BP Percentile --      Pulse Rate 03/01/24 0026 70     Resp 03/01/24 0026 (!) 22  Temp 03/01/24 0026 98.4 F (36.9 C)     Temp Source 03/01/24 0026 Oral     SpO2 03/01/24 0026 100 %     Weight 03/01/24 0023 140 lb (63.5 kg)     Height 03/01/24 0023 6' (1.829 m)     Head Circumference --      Peak Flow --      Pain Score 03/01/24 0023 10     Pain Loc --      Pain Education --      Exclude from Growth Chart --     Most recent vital signs: Vitals:   03/01/24 0530 03/01/24 0544  BP: 114/76 114/76  Pulse: 76 77  Resp: 14 17  Temp:  98.6 F (37 C)  SpO2: 99% 100%    CONSTITUTIONAL: Alert, responds appropriately to questions.  Thin, chronically ill-appearing, in  pain HEAD: Normocephalic, atraumatic EYES: Conjunctivae clear, pupils appear equal, sclera nonicteric ENT: normal nose; moist mucous membranes NECK: Supple, normal ROM CARD: RRR; S1 and S2 appreciated RESP: Patient is tachypneic, breath sounds clear and equal bilaterally; no wheezes, no rhonchi, no rales, no hypoxia or respiratory distress, speaking full sentences ABD/GI: Non-distended; soft, diffusely tender with voluntary guarding, no rebound BACK: The back appears normal EXT: Normal ROM in all joints; no deformity noted, no edema SKIN: Normal color for age and race; warm; no rash on exposed skin NEURO: Moves all extremities equally, normal speech PSYCH: The patient's mood and manner are appropriate.   ED Results / Procedures / Treatments   LABS: (all labs ordered are listed, but only abnormal results are displayed) Labs Reviewed  CBC WITH DIFFERENTIAL/PLATELET - Abnormal; Notable for the following components:      Result Value   Platelets 446 (*)    All other components within normal limits  COMPREHENSIVE METABOLIC PANEL WITH GFR - Abnormal; Notable for the following components:   Calcium  10.5 (*)    Total Protein 9.4 (*)    Albumin 5.2 (*)    All other components within normal limits  LACTIC ACID, PLASMA - Abnormal; Notable for the following components:   Lactic Acid, Venous 2.5 (*)    All other components within normal limits  LACTIC ACID, PLASMA - Abnormal; Notable for the following components:   Lactic Acid, Venous 2.4 (*)    All other components within normal limits  RESP PANEL BY RT-PCR (RSV, FLU A&B, COVID)  [id number]  CULTURE, BLOOD (ROUTINE X 2)  CULTURE, BLOOD (ROUTINE X 2)  C DIFFICILE QUICK SCREEN W PCR REFLEX    GASTROINTESTINAL PANEL BY PCR, STOOL (REPLACES STOOL CULTURE)  LIPASE, BLOOD  PROTIME-INR  ETHANOL  LACTIC ACID, PLASMA  PROCALCITONIN  URINALYSIS, W/ REFLEX TO CULTURE (INFECTION SUSPECTED)  URINE DRUG SCREEN, QUALITATIVE ([facility] ONLY)      EKG:   Date: 03/01/2024 3:47 AM  Rate: 67  Rhythm: normal sinus rhythm  QRS Axis: normal  Intervals: normal  ST/T Wave abnormalities: normal  Conduction Disutrbances: none  Narrative Interpretation: unremarkable     RADIOLOGY: My personal review and interpretation of imaging: CT scan shows pancreatitis.  Chest x-ray clear.  I have personally reviewed all radiology reports.   CT ABDOMEN PELVIS W CONTRAST Result Date: 03/01/2024 CLINICAL DATA:  Generalized abdominal pain. EXAM: CT ABDOMEN AND PELVIS WITH CONTRAST TECHNIQUE: Multidetector CT imaging of the abdomen and pelvis was performed using the standard protocol following bolus administration of intravenous contrast. RADIATION DOSE REDUCTION: This exam was performed according to the departmental dose-optimization program  which includes automated exposure control, adjustment of the mA and/or kV according to patient size and/or use of iterative reconstruction technique. CONTRAST:  OMNIPAQUE  IOHEXOL  300 MG/ML  SOLN COMPARISON:  February 02, 2024 FINDINGS: Lower chest: No acute abnormality. Hepatobiliary: No focal liver abnormality is seen. No gallstones, gallbladder wall thickening, or biliary dilatation. Pancreas: Since the prior exam there is been interval development of adjacent 3.3 cm x 2.6 cm x 3.6 cm and 3.5 cm x 2.8 cm x 4.3 cm well-defined heterogeneous mildly hyperdense lesions anterior and posterior to the junction of the pancreatic body and head (axial CT images 25 through 33, CT series 2). These areas may be interconnected inferiorly. Peripancreatic fluid and inflammatory stranding is again seen along the posterior aspect of the body of the pancreas. The 1.6 cm proximal pancreas fluid dense lesion seen on the prior study is again visualized and measures 6.7 mm x 11.5 mm on the current study (axial CT image 26, CT series 2). Spleen: Normal in size without focal abnormality. Adrenals/Urinary Tract: Adrenal glands are  unremarkable. Kidneys are normal in size, without renal calculi or hydronephrosis. A stable subcentimeter simple cyst is seen within the right kidney. The urinary bladder is poorly distended and subsequently limited in evaluation. Moderate to marked severity diffuse urinary bladder wall thickening is seen. Stomach/Bowel: Stomach is within normal limits. Appendix appears normal. Dilated small bowel loops are again seen within the mid to lower abdomen (maximum small bowel diameter of approximately 4.0 cm). A clear transition zone is not identified. Noninflamed diverticula are seen within the descending and sigmoid colon. Vascular/Lymphatic: No significant vascular findings are present. No enlarged abdominal or pelvic lymph nodes. Reproductive: Mild to moderate severity prostate gland enlargement is noted. Other: No abdominal wall hernia or abnormality. No abdominopelvic ascites. Musculoskeletal: No acute or significant osseous findings. IMPRESSION: 1. Acute pancreatitis with findings suspicious for interval complex pancreatic pseudocyst formation since the prior study. MRI correlation is recommended. 2. Persistent small bowel dilatation consistent with a partial small bowel obstruction versus ileus. 3. Diffuse urinary bladder wall thickening which may represent acute cystitis. Correlation with urinalysis is recommended. 4. Colonic diverticulosis. Electronically Signed   By: Virgle Grime M.D.   On: 03/01/2024 03:49   DG Chest Port 1 View Result Date: 03/01/2024 CLINICAL DATA:  Possible sepsis EXAM: PORTABLE CHEST 1 VIEW COMPARISON:  02/13/2024 FINDINGS: Elevation of the left diaphragm. No consolidation, pleural effusion or pneumothorax. Stable cardiomediastinal silhouette. No pneumothorax IMPRESSION: No active disease. Electronically Signed   By: Esmeralda Hedge M.D.   On: 03/01/2024 00:57     PROCEDURES:  Critical Care performed: Yes, see critical care procedure note(s)   CRITICAL CARE Performed by:  Starling Eck Jamesyn Lindell   Total critical care time: 45 minutes  Critical care time was exclusive of separately billable procedures and treating other patients.  Critical care was necessary to treat or prevent imminent or life-threatening deterioration.  Critical care was time spent personally by me on the following activities: development of treatment plan with patient and/or surrogate as well as nursing, discussions with consultants, evaluation of patient's response to treatment, examination of patient, obtaining history from patient or surrogate, ordering and performing treatments and interventions, ordering and review of laboratory studies, ordering and review of radiographic studies, pulse oximetry and re-evaluation of patient's condition.   Aaron Aas1-3 Lead EKG Interpretation  Performed by: Koden Hunzeker, Clover Dao, DO Authorized by: Nakai Pollio, Clover Dao, DO     Interpretation: normal     ECG rate:  72  ECG rate assessment: normal     Rhythm: sinus rhythm     Ectopy: none     Conduction: normal       IMPRESSION / MDM / ASSESSMENT AND PLAN / ED COURSE  I reviewed the triage vital signs and the nursing notes.    Patient here with generalized abdominal pain, vomiting, diarrhea.  He is hypotensive, tachypneic here.  Oral temperature of 99.1.  The patient is on the cardiac monitor to evaluate for evidence of arrhythmia and/or significant heart rate changes.   DIFFERENTIAL DIAGNOSIS (includes but not limited to):   Pancreatitis, viral gastroenteritis, dehydration, sepsis, colitis, diverticulitis, appendicitis, UTI, infectious diarrhea, C. difficile given recent hospitalization   Patient's presentation is most consistent with acute presentation with potential threat to life or bodily function.   PLAN: Will obtain labs, urine, cultures, chest x-ray, COVID and flu swab, CT of the abdomen pelvis.  Concern for possible sepsis versus hypovolemia/dehydration from GI loss given hypotension.  Will give 30 mL/kg IV  fluid bolus, broad-spectrum antibiotics, pain medication.  Anticipate admission.   MEDICATIONS GIVEN IN ED: Medications  lactated ringers  infusion ( Intravenous New Bag/Given 03/01/24 0238)  fentaNYL  (SUBLIMAZE ) injection 100 mcg (100 mcg Intravenous Given 03/01/24 0519)  ondansetron  (ZOFRAN ) injection 4 mg (4 mg Intravenous Given 03/01/24 0231)  lactated ringers  bolus 1,000 mL (0 mLs Intravenous Stopped 03/01/24 0327)    And  lactated ringers  bolus 1,000 mL (0 mLs Intravenous Stopped 03/01/24 0327)  ceFEPIme  (MAXIPIME ) 2 g in sodium chloride  0.9 % 100 mL IVPB (0 g Intravenous Stopped 03/01/24 0327)  metroNIDAZOLE  (FLAGYL ) IVPB 500 mg (0 mg Intravenous Stopped 03/01/24 0441)  vancomycin  (VANCOCIN ) IVPB 1000 mg/200 mL premix (1,000 mg Intravenous New Bag/Given 03/01/24 0453)  fentaNYL  (SUBLIMAZE ) injection 100 mcg (100 mcg Intravenous Given 03/01/24 0231)  iohexol  (OMNIPAQUE ) 300 MG/ML solution 100 mL (100 mLs Intravenous Contrast Given 03/01/24 0256)  thiamine  (VITAMIN B1) injection 100 mg (100 mg Intravenous Given 03/01/24 0450)     ED COURSE: Blood pressures improving with IV hydration.  Lactic elevated at 2.5 but downtrending down to 1.8.  Normal white blood cell count, hemoglobin.  Normal LFTs and lipase.  Procalcitonin negative.  COVID, flu and RSV negative.  CT of the abdomen pelvis reviewed and interpreted by myself and the radiologist and shows acute pancreatitis with development of a pancreatic pseudocyst.  He also has persistent small bowel obstruction versus ileus that was seen on previous imaging.  We discussed placing NG tube which patient agreed to however he was not able to tolerate this being placed in the ED despite multiple attempts.  CT also shows diffuse bladder wall thickening which could represent cystitis.  Have not been able to obtain a urine sample as patient has been both incontinent of stool and urine.  He is getting antibiotics here.  Will discuss with hospitalist for  admission.   CONSULTS:  Consulted and discussed patient's case with hospitalist, Dr. Vallarie Gauze.  I have recommended admission and consulting physician agrees and will place admission orders.  Patient (and family if present) agree with this plan.   I reviewed all nursing notes, vitals, pertinent previous records.  All labs, EKGs, imaging ordered have been independently reviewed and interpreted by myself.    OUTSIDE RECORDS REVIEWED: Reviewed patient's recent admission.       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   Aleda Madl, Clover Dao, DO 03/01/24 [phone]

## 2024-03-01 NOTE — ED Notes (Signed)
 Multiple members of nursing team unsuccessfully gained peripheral access. Pt vital signs stable. MD aware. IV team consulted.

## 2024-03-01 NOTE — Assessment & Plan Note (Signed)
 Mood stable  No HI/SI  Cont seroquel  once po status improves

## 2024-03-01 NOTE — ED Notes (Signed)
Gary [name] at bedside 

## 2024-03-01 NOTE — H&P (Addendum)
 History and Physical    Patient: Gary [name] GNF:[id number] DOB: 10/23/1986 DOA: 03/01/2024 DOS: the patient was seen and examined on 03/01/2024 PCP: Pcp, No  Patient coming from: Home  Chief Complaint:  Chief Complaint  Patient presents with   Abdominal Pain   HPI: ADIN LAKER is a 38 y.o. male with medical history significant of HIV, alcohol dependence, schizophrenia, chronic pancreatitis, frequent admissions to the hospital presenting w/ acute on chronic pancreatitis, SBO.  Patient noted to have multiple recurrent admissions for similar issues roughly 3+ admissions in April thus far.  This will be admission #4.  Patient reports generalized abdominal pain with past 4 to 5 days.  Last alcohol intake was roughly 4 days ago with 1 bottle of wine.  Denies any illicit drug use.  No chest pain or shortness of breath.  Mild nausea.  Denies any SI/HI.  No fevers or chills.  Mild diarrhea.  Symptoms similar to previous episodes of pancreatitis.  Denies any illicit drug use. Presented to the ER afebrile, hemodynamically stable.  Satting well on room air.  White count 6, hemoglobin 13.7, platelets 446.  White count 2.4-1.8.  COVID flu and RSV negative.  Alcohol level within normal limits.  Creatinine 0.9.  CT with acute pancreatitis with concern for interval complex pancreatic pseudocyst formation-recommending MRI.  Also with small bowel obstruction on imaging.  Diffuse urinary bladder wall thickening concerning for possible cystitis. Review of Systems: As mentioned in the history of present illness. All other systems reviewed and are negative. Past Medical History:  Diagnosis Date   Alcohol abuse    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   Anxiety    Bipolar 2 disorder (HCC)    HIV (human immunodeficiency virus infection) (HCC)    Pancreatitis    PTSD (post-traumatic stress disorder)    Schizophrenia (HCC)    Seizures (HCC)    Subdural hematoma (HCC)    Past Surgical History:  Procedure  Laterality Date   BIOPSY  04/19/2022   Procedure: BIOPSY;  Surgeon: Normie Becton., MD;  Location: [facility] ENDOSCOPY;  Service: Gastroenterology;;   ENTEROSCOPY N/A 04/19/2022   Procedure: ENTEROSCOPY;  Surgeon: Normie Becton., MD;  Location: [facility] ENDOSCOPY;  Service: Gastroenterology;  Laterality: N/A;   INCISION AND DRAINAGE PERIRECTAL ABSCESS N/A 09/24/2016   Procedure: IRRIGATION AND DEBRIDEMENT PERIRECTAL ABSCESS;  Surgeon: Gwyndolyn Lerner, MD;  Location: [facility] ORS;  Service: General;  Laterality: N/A;   none     Social History:  reports that he has been smoking cigarettes. He started smoking about 21 years ago. He has a 10.7 pack-year smoking history. He has never used smokeless tobacco. He reports current alcohol use of about 105.0 standard drinks of alcohol per week. He reports that he does not currently use drugs after having used the following drugs: Methamphetamines and Cocaine.  Allergies  Allergen Reactions   Tegretol  [Carbamazepine ] Other (See Comments)    Vertigo  Paralysis   Caffeine Palpitations    Family History  Problem Relation Age of Onset   Hypertension Mother    Alcohol abuse Mother    Alcoholism Mother    Alcohol abuse Father    Colon cancer Other    Other Other    Cancer Other     Prior to Admission medications   Medication Sig Start Date End Date Taking? Authorizing Provider  acetaminophen  (TYLENOL ) 325 MG tablet Take 2 tablets (650 mg total) by mouth 3 (three) times daily as needed for up to 10 days for mild  pain (pain score 1-3), moderate pain (pain score 4-6), headache or fever. 02/24/24 03/05/24 Yes Althia Atlas, MD  bictegravir-emtricitabine -tenofovir  AF (BIKTARVY ) 50-200-25 MG TABS tablet Take 1 tablet by mouth daily. 02/24/24  Yes Althia Atlas, MD  gabapentin  (NEURONTIN ) 300 MG capsule Take 2 capsules (600 mg total) by mouth 3 (three) times daily for 14 days. 02/28/24 03/13/24 Yes Dezii, Alexandra, DO  meloxicam  (MOBIC ) 15 MG tablet Take 1  tablet (15 mg total) by mouth daily for 14 days. 02/28/24 03/13/24 Yes Dezii, Alexandra, DO  QUEtiapine  (SEROQUEL ) 50 MG tablet Take 1 tablet (50 mg total) by mouth 2 (two) times daily. 02/24/24  Yes Althia Atlas, MD  famotidine  (PEPCID ) 20 MG tablet Take 1 tablet (20 mg total) by mouth 2 (two) times daily. Patient not taking: Reported on 06/01/2019 05/14/19 06/02/19  Mozell Arias, MD    Physical Exam: Vitals:   03/01/24 0630 03/01/24 0700 03/01/24 0800 03/01/24 0830  BP: 97/64 103/66 118/60 108/67  Pulse: (!) 55 (!) 50 (!) 59 (!) 56  Resp: 12 10 19 13   Temp:      TempSrc:      SpO2: 100% 100% 100% 100%  Weight:      Height:       Physical Exam Constitutional:      Comments: Thin, cachectic  HENT:     Head: Normocephalic and atraumatic.     Nose: Nose normal.     Mouth/Throat:     Mouth: Mucous membranes are dry.  Eyes:     Pupils: Pupils are equal, round, and reactive to light.  Cardiovascular:     Rate and Rhythm: Normal rate and regular rhythm.  Pulmonary:     Effort: Pulmonary effort is normal.  Abdominal:     General: Bowel sounds are normal.     Comments: Positive mild generalized abdominal pain.  Musculoskeletal:        General: Normal range of motion.  Skin:    General: Skin is dry.  Neurological:     General: No focal deficit present.  Psychiatric:        Mood and Affect: Mood normal.     Data Reviewed:  There are no new results to review at this time.  CT ABDOMEN PELVIS W CONTRAST CLINICAL DATA:  Generalized abdominal pain.  EXAM: CT ABDOMEN AND PELVIS WITH CONTRAST  TECHNIQUE: Multidetector CT imaging of the abdomen and pelvis was performed using the standard protocol following bolus administration of intravenous contrast.  RADIATION DOSE REDUCTION: This exam was performed according to the departmental dose-optimization program which includes automated exposure control, adjustment of the mA and/or kV according to patient size and/or use of  iterative reconstruction technique.  CONTRAST:  OMNIPAQUE  IOHEXOL  300 MG/ML  SOLN  COMPARISON:  February 02, 2024  FINDINGS: Lower chest: No acute abnormality.  Hepatobiliary: No focal liver abnormality is seen. No gallstones, gallbladder wall thickening, or biliary dilatation.  Pancreas: Since the prior exam there is been interval development of adjacent 3.3 cm x 2.6 cm x 3.6 cm and 3.5 cm x 2.8 cm x 4.3 cm well-defined heterogeneous mildly hyperdense lesions anterior and posterior to the junction of the pancreatic body and head (axial CT images 25 through 33, CT series 2). These areas may be interconnected inferiorly.  Peripancreatic fluid and inflammatory stranding is again seen along the posterior aspect of the body of the pancreas.  The 1.6 cm proximal pancreas fluid dense lesion seen on the prior study is again visualized and measures 6.7  mm x 11.5 mm on the current study (axial CT image 26, CT series 2).  Spleen: Normal in size without focal abnormality.  Adrenals/Urinary Tract: Adrenal glands are unremarkable. Kidneys are normal in size, without renal calculi or hydronephrosis. A stable subcentimeter simple cyst is seen within the right kidney. The urinary bladder is poorly distended and subsequently limited in evaluation. Moderate to marked severity diffuse urinary bladder wall thickening is seen.  Stomach/Bowel: Stomach is within normal limits. Appendix appears normal. Dilated small bowel loops are again seen within the mid to lower abdomen (maximum small bowel diameter of approximately 4.0 cm). A clear transition zone is not identified. Noninflamed diverticula are seen within the descending and sigmoid colon.  Vascular/Lymphatic: No significant vascular findings are present. No enlarged abdominal or pelvic lymph nodes.  Reproductive: Mild to moderate severity prostate gland enlargement is noted.  Other: No abdominal wall hernia or abnormality. No  abdominopelvic ascites.  Musculoskeletal: No acute or significant osseous findings.  IMPRESSION: 1. Acute pancreatitis with findings suspicious for interval complex pancreatic pseudocyst formation since the prior study. MRI correlation is recommended. 2. Persistent small bowel dilatation consistent with a partial small bowel obstruction versus ileus. 3. Diffuse urinary bladder wall thickening which may represent acute cystitis. Correlation with urinalysis is recommended. 4. Colonic diverticulosis.  Electronically Signed   By: Virgle Grime M.D.   On: 03/01/2024 03:49 DG Chest Port 1 View CLINICAL DATA:  Possible sepsis  EXAM: PORTABLE CHEST 1 VIEW  COMPARISON:  02/13/2024  FINDINGS: Elevation of the left diaphragm. No consolidation, pleural effusion or pneumothorax. Stable cardiomediastinal silhouette. No pneumothorax  IMPRESSION: No active disease.  Electronically Signed   By: Esmeralda Hedge M.D.   On: 03/01/2024 00:57  Lab Results  Component Value Date   WBC 5.8 03/01/2024   HGB 13.7 03/01/2024   HCT 40.4 03/01/2024   MCV 90.8 03/01/2024   PLT 446 (H) 03/01/2024   Last metabolic panel Lab Results  Component Value Date   GLUCOSE 93 03/01/2024   NA 136 03/01/2024   K 3.7 03/01/2024   CL 98 03/01/2024   CO2 23 03/01/2024   BUN 10 03/01/2024   CREATININE 0.90 03/01/2024   GFRNONAA >60 03/01/2024   CALCIUM  10.5 (H) 03/01/2024   PHOS 3.3 02/28/2024   PROT 9.4 (H) 03/01/2024   ALBUMIN 5.2 (H) 03/01/2024   BILITOT 1.2 03/01/2024   ALKPHOS 75 03/01/2024   AST 22 03/01/2024   ALT 13 03/01/2024   ANIONGAP 15 03/01/2024    Assessment and Plan: Acute on chronic pancreatitis (HCC) Recurrent episodes of pancreatitis in setting of heavy ETOH use  CT imaging today w/ pancreatitis and new complex pancreatic pseudocyst LFTs stable  NPO in setting of SBO Pain control  IVF hydration MRI abdomen pending to better assess pseudocyst  WBC grossly stable   Started on broad spectrum antibiotics in the ER- will continue for now  Consider GI consultation as appropriate     Alcohol dependence with withdrawal (HCC) Alcohol level within normal limits Noted history of maladaptive behavior patterns concerning for narcotic and sedating medication abuse Will place on CIWA protocol for now Minimize anxiolytic use only as appropriate given history Monitor  HIV disease (HCC) Cont home biktarvy    Ileus (HCC) Noted partial small bowel obstruction versus ileus on imaging.  NPO for now  NGT as appropriate  Consult general surgery.  Follow up recommendations    Polysubstance abuse (HCC) Urine drug screen pending    Nicotine  dependence, cigarettes, uncomplicated Nicotine   patch    High risk for readmission Malingering Baseline history of Frequent admissions secondary to pancreatitis, alcohol withdrawal, and pain seeking behaviors.  Last admission 4/18-4/21 Noted concurrent acute on chronic pancreatitis with pancreatic pseudocyst Minimize narcotics as appropriate Follow closely  Gastritis and duodenitis IV PPI  Severe episode of recurrent major depressive disorder, without psychotic features (HCC) Mood stable  No HI/SI  Cont seroquel  once po status improves        Advance Care Planning:   Code Status: Full Code   Consults: None   Family Communication: No family at the bedside   Severity of Illness: The appropriate patient status for this patient is OBSERVATION. Observation status is judged to be reasonable and necessary in order to provide the required intensity of service to ensure the patient's safety. The patient's presenting symptoms, physical exam findings, and initial radiographic and laboratory data in the context of their medical condition is felt to place them at decreased risk for further clinical deterioration. Furthermore, it is anticipated that the patient will be medically stable for discharge from the hospital  within 2 midnights of admission.   Author: Corrinne Din, MD 03/01/2024 8:50 AM  For on call review [email or web address].

## 2024-03-01 NOTE — Assessment & Plan Note (Signed)
 IV PPI

## 2024-03-01 NOTE — ED Notes (Signed)
 Repeat green top sent down to lab

## 2024-03-01 NOTE — Assessment & Plan Note (Signed)
 Noted partial small bowel obstruction versus ileus on imaging.  NPO for now  NGT as appropriate  Consult general surgery.  Follow up recommendations

## 2024-03-01 NOTE — Progress Notes (Signed)
 CODE SEPSIS - PHARMACY COMMUNICATION  **Broad Spectrum Antibiotics should be administered within 1 hour of Sepsis diagnosis**  Time Code Sepsis Called/Page Received: 4/23 @ 0037  Antibiotics Ordered: Cefepime , Vanc, metronidazole    Time of 1st antibiotic administration: Cefepime  2 gm IV X 1 on 4/23 @ 0241  Additional action taken by pharmacy:  Messaged RN around 0130 to remind to give abx,  RN was having difficulty establishing IV access , IV team will assist   If necessary, Name of Provider/Nurse Contacted: Edson Graces D ,PharmD Clinical Pharmacist  03/01/2024  3:05 AM

## 2024-03-01 NOTE — ED Notes (Signed)
IV team here at this time

## 2024-03-01 NOTE — Assessment & Plan Note (Signed)
 Alcohol level within normal limits Noted history of maladaptive behavior patterns concerning for narcotic and sedating medication abuse Will place on CIWA protocol for now Minimize anxiolytic use only as appropriate given history Monitor

## 2024-03-01 NOTE — ED Notes (Addendum)
 Secretary called to notify provider of critical lab value

## 2024-03-01 NOTE — Progress Notes (Signed)
 Patient ID: Gary [name], male   DOB: Aug 02, 1986, 38 y.o.   MRN: [id number]   SURGICAL CONSULTATION NOTE   HISTORY OF PRESENT ILLNESS (HPI):  38 y.o. male presented to [facility] ED for evaluation of abdominal pain. Patient reports his abdominal pain is generalized.  No specific pain radiation.  Cannot identify any alleviating or aggravating factors.  He endorses associated nausea and vomiting prior to admission.  Patient has been admitted at least 4 times during the last month due to acute on chronic pancreatitis.  This is due to alcohol induced pancreatitis.  In the ED he was found with generalized abdominal pain.  No specific tenderness of patient.  Vital signs were stable.  No fever.  No tachycardia.  Labs shows normal white cell count of 5.8.  Hemoglobin 13.7.  CMP without any significant electrolyte disturbance.  Adequate renal function.  Lipase is normal.  He had a CT scan of the abdomen and pelvis that shows acute pancreatitis with interval complex pancreatic pseudocyst formation.  CT scan also concern of bowel dilation consistent with ileus versus obstruction.  I personally evaluated the images.  No transition point was identified.  No free air or free fluid.  Surgery is consulted by Dr. Daisey Dryer in this context for evaluation and management of small bowel ileus versus obstruction.  PAST MEDICAL HISTORY (PMH):  Past Medical History:  Diagnosis Date   Alcohol abuse    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   Anxiety    Bipolar 2 disorder (HCC)    HIV (human immunodeficiency virus infection) (HCC)    Pancreatitis    PTSD (post-traumatic stress disorder)    Schizophrenia (HCC)    Seizures (HCC)    Subdural hematoma (HCC)      PAST SURGICAL HISTORY (PSH):  Past Surgical History:  Procedure Laterality Date   BIOPSY  04/19/2022   Procedure: BIOPSY;  Surgeon: Normie Becton., MD;  Location: [facility] ENDOSCOPY;  Service: Gastroenterology;;   ENTEROSCOPY N/A 04/19/2022   Procedure:  ENTEROSCOPY;  Surgeon: Normie Becton., MD;  Location: [facility] ENDOSCOPY;  Service: Gastroenterology;  Laterality: N/A;   INCISION AND DRAINAGE PERIRECTAL ABSCESS N/A 09/24/2016   Procedure: IRRIGATION AND DEBRIDEMENT PERIRECTAL ABSCESS;  Surgeon: Gwyndolyn Lerner, MD;  Location: [facility] ORS;  Service: General;  Laterality: N/A;   none       MEDICATIONS:  Prior to Admission medications   Medication Sig Start Date End Date Taking? Authorizing Provider  acetaminophen  (TYLENOL ) 325 MG tablet Take 2 tablets (650 mg total) by mouth 3 (three) times daily as needed for up to 10 days for mild pain (pain score 1-3), moderate pain (pain score 4-6), headache or fever. 02/24/24 03/05/24 Yes Althia Atlas, MD  bictegravir-emtricitabine -tenofovir  AF (BIKTARVY ) 50-200-25 MG TABS tablet Take 1 tablet by mouth daily. 02/24/24  Yes Althia Atlas, MD  gabapentin  (NEURONTIN ) 300 MG capsule Take 2 capsules (600 mg total) by mouth 3 (three) times daily for 14 days. 02/28/24 03/13/24 Yes Dezii, Alexandra, DO  meloxicam  (MOBIC ) 15 MG tablet Take 1 tablet (15 mg total) by mouth daily for 14 days. 02/28/24 03/13/24 Yes Dezii, Alexandra, DO  QUEtiapine  (SEROQUEL ) 50 MG tablet Take 1 tablet (50 mg total) by mouth 2 (two) times daily. 02/24/24  Yes Althia Atlas, MD  famotidine  (PEPCID ) 20 MG tablet Take 1 tablet (20 mg total) by mouth 2 (two) times daily. Patient not taking: Reported on 06/01/2019 05/14/19 06/02/19  Mozell Arias, MD     ALLERGIES:  Allergies  Allergen Reactions   Tegretol  [  Carbamazepine ] Other (See Comments)    Vertigo  Paralysis   Caffeine Palpitations     SOCIAL HISTORY:  Social History   Socioeconomic History   Marital status: Single    Spouse name: Not on file   Number of children: Not on file   Years of education: Not on file   Highest education level: Not on file  Occupational History   Occupation: unemployed  Tobacco Use   Smoking status: Every Day    Current packs/day: 0.50    Average  packs/day: 0.5 packs/day for 21.3 years (10.7 ttl pk-yrs)    Types: Cigarettes    Start date: 2004   Smokeless tobacco: Never   Tobacco comments:    unable to smoke while incarcerated 6+ months 02/13/20  Vaping Use   Vaping status: Never Used  Substance and Sexual Activity   Alcohol use: Yes    Alcohol/week: 105.0 standard drinks of alcohol    Types: 105 Cans of beer per week    Comment: drinks every day, all day, "whatever I can get my hands on". "Half a gallon of vodka" 12/16   Drug use: Not Currently    Types: Methamphetamines, Cocaine    Comment: last used 10/22/2023   Sexual activity: Yes    Partners: Male, Male    Comment: declined condoms 01/2024  Other Topics Concern   Not on file  Social History Narrative   "Currently living on the streets"   Independent at baseline   Occasionally goes to the [facility]   Social Drivers of Health   Financial Resource Strain: Patient Declined (10/11/2023)   Overall Financial Resource Strain (CARDIA)    Difficulty of Paying Living Expenses: Patient declined  Food Insecurity: No Food Insecurity (02/25/2024)   Hunger Vital Sign    Worried About Running Out of Food in the Last Year: Never true    Ran Out of Food in the Last Year: Never true  Recent Concern: Food Insecurity - Food Insecurity Present (02/19/2024)   Hunger Vital Sign    Worried About Running Out of Food in the Last Year: Often true    Ran Out of Food in the Last Year: Often true  Transportation Needs: Patient Declined (02/25/2024)   PRAPARE - Administrator, Civil Service (Medical): Patient declined    Lack of Transportation (Non-Medical): Patient declined  Recent Concern: Transportation Needs - Unmet Transportation Needs (02/19/2024)   PRAPARE - Transportation    Lack of Transportation (Medical): Yes    Lack of Transportation (Non-Medical): Yes  Physical Activity: Not on file  Stress: No Stress Concern Present (06/30/2022)   Received from [facility]    [employer] of Occupational Health - Occupational Stress Questionnaire    Feeling of Stress : Not at all  Recent Concern: Stress - Stress Concern Present (06/25/2022)   Received from [facility] of Occupational Health - Occupational Stress Questionnaire    Feeling of Stress : Rather much  Social Connections: Patient Declined (02/25/2024)   Social Connection and Isolation Panel [NHANES]    Frequency of Communication with Friends and Family: Patient declined    Frequency of Social Gatherings with Friends and Family: Patient declined    Attends Religious Services: Patient declined    Active Member of Clubs or Organizations: Patient declined    Attends Banker Meetings: Patient declined    Marital Status: Patient declined  Recent Concern: Social Connections - Socially Isolated (02/03/2024)   Social Connection  and Isolation Panel [NHANES]    Frequency of Communication with Friends and Family: Never    Frequency of Social Gatherings with Friends and Family: Never    Attends Religious Services: Never    Database administrator or Organizations: No    Attends Banker Meetings: Never    Marital Status: Never married  Intimate Partner Violence: Patient Declined (02/25/2024)   Humiliation, Afraid, Rape, and Kick questionnaire    Fear of Current or Ex-Partner: Patient declined    Emotionally Abused: Patient declined    Physically Abused: Patient declined    Sexually Abused: Patient declined      FAMILY HISTORY:  Family History  Problem Relation Age of Onset   Hypertension Mother    Alcohol abuse Mother    Alcoholism Mother    Alcohol abuse Father    Colon cancer Other    Other Other    Cancer Other      REVIEW OF SYSTEMS:  Constitutional: denies weight loss, fever, chills, or sweats  Eyes: denies any other vision changes, history of eye injury  ENT: denies sore throat, hearing problems  Respiratory: denies shortness of breath,  wheezing  Cardiovascular: denies chest pain, palpitations  Gastrointestinal: Positive abdominal pain, nausea and vomiting Genitourinary: denies burning with urination or urinary frequency Musculoskeletal: denies any other joint pains or cramps  Skin: denies any other rashes or skin discolorations  Neurological: denies any other headache, dizziness, weakness  Psychiatric: denies any other depression, anxiety   All other review of systems were negative   VITAL SIGNS:  Temp:  [97.6 F (36.4 C)-99.1 F (37.3 C)] 97.6 F (36.4 C) (04/23 1444) Pulse Rate:  [42-86] 52 (04/23 1800) Resp:  [10-49] 13 (04/23 1730) BP: (72-118)/(50-87) 88/62 (04/23 1800) SpO2:  [97 %-100 %] 100 % (04/23 1800) Weight:  [63.5 kg] 63.5 kg (04/23 0023)     Height: 6' (182.9 cm) Weight: 63.5 kg BMI (Calculated): 18.98   INTAKE/OUTPUT:  This shift: No intake/output data recorded.  Last 2 shifts: @IOLAST2SHIFTS @   PHYSICAL EXAM:  Constitutional:  -- Normal body habitus  -- Awake, alert, and oriented x3  Eyes:  -- Pupils equally round and reactive to light  -- No scleral icterus  Ear, nose, and throat:  -- No jugular venous distension  Pulmonary:  -- No crackles  -- Equal breath sounds bilaterally -- Breathing non-labored at rest Cardiovascular:  -- S1, S2 present  -- No pericardial rubs Gastrointestinal:  -- Abdomen soft, mild generalized tenderness.  No focal tenderness.  Non-distended, no guarding or rebound tenderness -- No abdominal masses appreciated, pulsatile or otherwise  Musculoskeletal and Integumentary:  -- Wounds: None appreciated -- Extremities: B/L UE and LE FROM, hands and feet warm, no edema  Neurologic:  -- Motor function: intact and symmetric -- Sensation: intact and symmetric   Labs:     Latest Ref Rng & Units 03/01/2024    1:11 AM 02/28/2024    6:08 AM 02/27/2024    3:38 AM  CBC  WBC 4.0 - 10.5 K/uL 5.8  2.6  2.2   Hemoglobin 13.0 - 17.0 g/dL 16.1  9.4  09.6   Hematocrit  39.0 - 52.0 % 40.4  28.1  31.4   Platelets 150 - 400 K/uL 446  172  163       Latest Ref Rng & Units 03/01/2024    1:11 AM 02/28/2024    6:08 AM 02/27/2024    3:38 AM  CMP  Glucose 70 -  99 mg/dL 93  99  89   BUN 6 - 20 mg/dL 10  6  8    Creatinine 0.61 - 1.24 mg/dL 5.40  9.81  1.91   Sodium 135 - 145 mmol/L 136  139  137   Potassium 3.5 - 5.1 mmol/L 3.7  4.0  4.1   Chloride 98 - 111 mmol/L 98  109  107   CO2 22 - 32 mmol/L 23  25  25    Calcium  8.9 - 10.3 mg/dL 47.8  8.5  9.0   Total Protein 6.5 - 8.1 g/dL 9.4  5.6  6.1   Total Bilirubin 0.0 - 1.2 mg/dL 1.2  0.3  0.5   Alkaline Phos 38 - 126 U/L 75  40  44   AST 15 - 41 U/L 22  15  12    ALT 0 - 44 U/L 13  10  11      Imaging studies:  EXAM: CT ABDOMEN AND PELVIS WITH CONTRAST   TECHNIQUE: Multidetector CT imaging of the abdomen and pelvis was performed using the standard protocol following bolus administration of intravenous contrast.   RADIATION DOSE REDUCTION: This exam was performed according to the departmental dose-optimization program which includes automated exposure control, adjustment of the mA and/or kV according to patient size and/or use of iterative reconstruction technique.   CONTRAST:  OMNIPAQUE  IOHEXOL  300 MG/ML  SOLN   COMPARISON:  February 02, 2024   FINDINGS: Lower chest: No acute abnormality.   Hepatobiliary: No focal liver abnormality is seen. No gallstones, gallbladder wall thickening, or biliary dilatation.   Pancreas: Since the prior exam there is been interval development of adjacent 3.3 cm x 2.6 cm x 3.6 cm and 3.5 cm x 2.8 cm x 4.3 cm well-defined heterogeneous mildly hyperdense lesions anterior and posterior to the junction of the pancreatic body and head (axial CT images 25 through 33, CT series 2). These areas may be interconnected inferiorly.   Peripancreatic fluid and inflammatory stranding is again seen along the posterior aspect of the body of the pancreas.   The 1.6 cm proximal  pancreas fluid dense lesion seen on the prior study is again visualized and measures 6.7 mm x 11.5 mm on the current study (axial CT image 26, CT series 2).   Spleen: Normal in size without focal abnormality.   Adrenals/Urinary Tract: Adrenal glands are unremarkable. Kidneys are normal in size, without renal calculi or hydronephrosis. A stable subcentimeter simple cyst is seen within the right kidney. The urinary bladder is poorly distended and subsequently limited in evaluation. Moderate to marked severity diffuse urinary bladder wall thickening is seen.   Stomach/Bowel: Stomach is within normal limits. Appendix appears normal. Dilated small bowel loops are again seen within the mid to lower abdomen (maximum small bowel diameter of approximately 4.0 cm). A clear transition zone is not identified. Noninflamed diverticula are seen within the descending and sigmoid colon.   Vascular/Lymphatic: No significant vascular findings are present. No enlarged abdominal or pelvic lymph nodes.   Reproductive: Mild to moderate severity prostate gland enlargement is noted.   Other: No abdominal wall hernia or abnormality. No abdominopelvic ascites.   Musculoskeletal: No acute or significant osseous findings.   IMPRESSION: 1. Acute pancreatitis with findings suspicious for interval complex pancreatic pseudocyst formation since the prior study. MRI correlation is recommended. 2. Persistent small bowel dilatation consistent with a partial small bowel obstruction versus ileus. 3. Diffuse urinary bladder wall thickening which may represent acute cystitis. Correlation with urinalysis is  recommended. 4. Colonic diverticulosis.     Electronically Signed   By: Virgle Grime M.D.   On: 03/01/2024 03:49  Assessment/Plan:  38 y.o. male with ileus versus obstruction, complicated by pertinent comorbidities including acute recurrent pancreatitis with pseudocyst, alcohol abuse, HIV,  schizophrenia.  Ileus versus obstruction - Clinically patient with diarrhea with abdominal distention.  There is intermittent nausea. - Imaging of CT scan with small bowel dilation without transition point consistent with ileus - Etiology most likely due to acute recurrent pancreatitis with alcohol withdrawal. - Patient has good chances of resolving ileus with optimization and treating medically his pancreatitis and alcohol withdrawal. - NGT was attempted but due to pain patient refused to get NGT - Upon my evaluation patient was doing her liquid diet and tolerating - At this point I do not recommend any surgical intervention.  I will continue to follow with serial clinical exam and imaging  Acute pancreatitis with pseudocyst - Currently no sign of infected pancreatitis - If treatment for pseudocyst needs to be addressed, patient will need to be transferred to tertiary center for advanced GI endoscopy evaluation versus gastrostomy endoscopically - Supportive management   Lucila Rye, MD

## 2024-03-01 NOTE — ED Notes (Signed)
 Pts BP is soft, spoke with MD [facility] face to face, awaiting new orders

## 2024-03-01 NOTE — ED Notes (Signed)
 Dorian RN attempted NG tube x2 with no success. Upon xray, tube found to be coiled in pt's esophagus. RN removed NG tube and pt's nose bled excessively in both nostrils. Pt unable to tolerate another attempt to place NG tube at this time. Bleeding controlled. Dr. Author Board MD made aware.

## 2024-03-01 NOTE — ED Triage Notes (Signed)
 Pt to triage via w/c, appears uncomfortable; c/o generalized abd pain tonight; hx pancreatitis; last ETOH "3-4 days ago"

## 2024-03-01 NOTE — Consult Note (Signed)
 CRITICAL CARE PROGRESS NOTE    Name: Gary [name] MRN: [id number] DOB: Oct 24, 1986     LOS: 0   SUBJECTIVE FINDINGS & SIGNIFICANT EVENTS    History of Presenting Illness:  38 yo with PMH of HIV, alcohol dependence, schizophrenia, chronic pancreatitis, frequent admissions to the hospital presenting w/ acute on chronic pancreatitis, SBO.  Patient noted to have multiple recurrent admissions for ETOH withdrawal. He complains of abdominal pain and symptoms of withdrawal. He was found to be in circulatory shock requiring pressor support and PCCM has been consulted for shock.   Lines/tubes   Microbiology/Sepsis markers: Results for orders placed or performed during the hospital encounter of 03/01/24  Blood Culture (routine x 2)     Status: None (Preliminary result)   Collection Time: 03/01/24  1:11 AM   Specimen: BLOOD  Result Value Ref Range Status   Specimen Description BLOOD LA  Final   Special Requests   Final    BOTTLES DRAWN AEROBIC AND ANAEROBIC Blood Culture results may not be optimal due to an inadequate volume of blood received in culture bottles   Culture   Final    NO GROWTH < 12 HOURS Performed at [facility], [address], [place], [place] [address]    Report Status PENDING  Incomplete  Blood Culture (routine x 2)     Status: None (Preliminary result)   Collection Time: 03/01/24  2:08 AM   Specimen: BLOOD  Result Value Ref Range Status   Specimen Description BLOOD LA  Final   Special Requests   Final    BOTTLES DRAWN AEROBIC AND ANAEROBIC Blood Culture results may not be optimal due to an inadequate volume of blood received in culture bottles   Culture   Final    NO GROWTH < 12 HOURS Performed at [facility], [address], [place], [place] [address]    Report Status  PENDING  Incomplete  Resp panel by RT-PCR (RSV, Flu A&B, Covid) Anterior Nasal Swab     Status: None   Collection Time: 03/01/24  2:19 AM   Specimen: Anterior Nasal Swab  Result Value Ref Range Status   SARS Coronavirus 2 by RT PCR NEGATIVE NEGATIVE Final    Comment: (NOTE) SARS-CoV-2 target nucleic acids are NOT DETECTED.  The SARS-CoV-2 RNA is generally detectable in upper respiratory specimens during the acute phase of infection. The lowest concentration of SARS-CoV-2 viral copies this assay can detect is 138 copies/mL. A negative result does not preclude SARS-Cov-2 infection and should not be used as the sole basis for treatment or other patient management decisions. A negative result may occur with  improper specimen collection/handling, submission of specimen other than nasopharyngeal swab, presence of viral mutation(s) within the areas targeted by this assay, and inadequate number of viral copies(<138 copies/mL). A negative result must be combined with clinical observations, patient history, and epidemiological information. The expected result is Negative.  Fact Sheet for Patients:  BloggerCourse.com  Fact Sheet for Healthcare Providers:  SeriousBroker.it  This test is no t yet approved or cleared by the [place]  FDA and  has been authorized for detection and/or diagnosis of SARS-CoV-2 by FDA under an Emergency Use Authorization (EUA). This EUA will remain  in effect (meaning this test can be used) for the duration of the COVID-19 declaration under Section 564(b)(1) of the Act, 21 U.S.C.section 360bbb-3(b)(1), unless the authorization is terminated  or revoked sooner.       Influenza A by PCR NEGATIVE  NEGATIVE Final   Influenza B by PCR NEGATIVE NEGATIVE Final    Comment: (NOTE) The Xpert Xpress SARS-CoV-2/FLU/RSV plus assay is intended as an aid in the diagnosis of influenza from Nasopharyngeal swab specimens  and should not be used as a sole basis for treatment. Nasal washings and aspirates are unacceptable for Xpert Xpress SARS-CoV-2/FLU/RSV testing.  Fact Sheet for Patients: BloggerCourse.com  Fact Sheet for Healthcare Providers: SeriousBroker.it  This test is not yet approved or cleared by the [place]  FDA and has been authorized for detection and/or diagnosis of SARS-CoV-2 by FDA under an Emergency Use Authorization (EUA). This EUA will remain in effect (meaning this test can be used) for the duration of the COVID-19 declaration under Section 564(b)(1) of the Act, 21 U.S.C. section 360bbb-3(b)(1), unless the authorization is terminated or revoked.     Resp Syncytial Virus by PCR NEGATIVE NEGATIVE Final    Comment: (NOTE) Fact Sheet for Patients: BloggerCourse.com  Fact Sheet for Healthcare Providers: SeriousBroker.it  This test is not yet approved or cleared by the [place]  FDA and has been authorized for detection and/or diagnosis of SARS-CoV-2 by FDA under an Emergency Use Authorization (EUA). This EUA will remain in effect (meaning this test can be used) for the duration of the COVID-19 declaration under Section 564(b)(1) of the Act, 21 U.S.C. section 360bbb-3(b)(1), unless the authorization is terminated or revoked.  Performed at [facility], [address] [place], [place] [address]   C Difficile Quick Screen w PCR reflex     Status: None   Collection Time: 03/01/24  8:12 AM   Specimen: STOOL  Result Value Ref Range Status   C Diff antigen NEGATIVE NEGATIVE Final   C Diff toxin NEGATIVE NEGATIVE Final   C Diff interpretation No C. difficile detected.  Final    Comment: Performed at [facility], [address] [place], [place] [address]  Gastrointestinal Panel by PCR , Stool     Status: None   Collection Time: 03/01/24  8:12 AM    Specimen: STOOL  Result Value Ref Range Status   Campylobacter species NOT DETECTED NOT DETECTED Final   Plesimonas shigelloides NOT DETECTED NOT DETECTED Final   Salmonella species NOT DETECTED NOT DETECTED Final   Yersinia enterocolitica NOT DETECTED NOT DETECTED Final   Vibrio species NOT DETECTED NOT DETECTED Final   Vibrio cholerae NOT DETECTED NOT DETECTED Final   Enteroaggregative E coli (EAEC) NOT DETECTED NOT DETECTED Final   Enteropathogenic E coli (EPEC) NOT DETECTED NOT DETECTED Final   Enterotoxigenic E coli (ETEC) NOT DETECTED NOT DETECTED Final   Shiga like toxin producing E coli (STEC) NOT DETECTED NOT DETECTED Final   Shigella/Enteroinvasive E coli (EIEC) NOT DETECTED NOT DETECTED Final   Cryptosporidium NOT DETECTED NOT DETECTED Final   Cyclospora cayetanensis NOT DETECTED NOT DETECTED Final   Entamoeba histolytica NOT DETECTED NOT DETECTED Final   Giardia lamblia NOT DETECTED NOT DETECTED Final   Adenovirus F40/41 NOT DETECTED NOT DETECTED Final   Astrovirus NOT DETECTED NOT DETECTED Final   Norovirus GI/GII NOT DETECTED NOT DETECTED Final   Rotavirus A NOT DETECTED NOT DETECTED Final   Sapovirus (I, II, IV, and V) NOT DETECTED NOT DETECTED Final    Comment: Performed at [facility], [address], [place], [place] [address]    Anti-infectives:  Anti-infectives (From admission, onward)    Start     Dose/Rate Route Frequency Ordered Stop   03/01/24 1300  metroNIDAZOLE  (FLAGYL ) IVPB 500  mg        500 mg 100 mL/hr over 60 Minutes Intravenous Every 12 hours 03/01/24 0742     03/01/24 1000  cefTRIAXone  (ROCEPHIN ) 2 g in sodium chloride  0.9 % 100 mL IVPB        2 g 200 mL/hr over 30 Minutes Intravenous Every 24 hours 03/01/24 0742     03/01/24 0045  ceFEPIme  (MAXIPIME ) 2 g in sodium chloride  0.9 % 100 mL IVPB        2 g 200 mL/hr over 30 Minutes Intravenous  Once 03/01/24 0037 03/01/24 0327   03/01/24 0045  metroNIDAZOLE  (FLAGYL ) IVPB 500 mg        500  mg 100 mL/hr over 60 Minutes Intravenous  Once 03/01/24 0037 03/01/24 0441   03/01/24 0045  vancomycin  (VANCOCIN ) IVPB 1000 mg/200 mL premix        1,000 mg 200 mL/hr over 60 Minutes Intravenous  Once 03/01/24 0037 03/01/24 0600        Consults: Treatment Team:  Eldred Grego, MD     PAST MEDICAL HISTORY   Past Medical History:  Diagnosis Date   Alcohol abuse    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   Anxiety    Bipolar 2 disorder (HCC)    HIV (human immunodeficiency virus infection) (HCC)    Pancreatitis    PTSD (post-traumatic stress disorder)    Schizophrenia (HCC)    Seizures (HCC)    Subdural hematoma (HCC)      SURGICAL HISTORY   Past Surgical History:  Procedure Laterality Date   BIOPSY  04/19/2022   Procedure: BIOPSY;  Surgeon: Normie Becton., MD;  Location: [facility] ENDOSCOPY;  Service: Gastroenterology;;   ENTEROSCOPY N/A 04/19/2022   Procedure: ENTEROSCOPY;  Surgeon: Normie Becton., MD;  Location: [facility] ENDOSCOPY;  Service: Gastroenterology;  Laterality: N/A;   INCISION AND DRAINAGE PERIRECTAL ABSCESS N/A 09/24/2016   Procedure: IRRIGATION AND DEBRIDEMENT PERIRECTAL ABSCESS;  Surgeon: Gwyndolyn Lerner, MD;  Location: [facility] ORS;  Service: General;  Laterality: N/A;   none       FAMILY HISTORY   Family History  Problem Relation Age of Onset   Hypertension Mother    Alcohol abuse Mother    Alcoholism Mother    Alcohol abuse Father    Colon cancer Other    Other Other    Cancer Other      SOCIAL HISTORY   Social History   Tobacco Use   Smoking status: Every Day    Current packs/day: 0.50    Average packs/day: 0.5 packs/day for 21.3 years (10.7 ttl pk-yrs)    Types: Cigarettes    Start date: 2004   Smokeless tobacco: Never   Tobacco comments:    unable to smoke while incarcerated 6+ months 02/13/20  Vaping Use   Vaping status: Never Used  Substance Use Topics   Alcohol use: Yes    Alcohol/week: 105.0 standard drinks of  alcohol    Types: 105 Cans of beer per week    Comment: drinks every day, all day, "whatever I can get my hands on". "Half a gallon of vodka" 12/16   Drug use: Not Currently    Types: Methamphetamines, Cocaine    Comment: last used 10/22/2023     MEDICATIONS   Current Medication:  Current Facility-Administered Medications:    cefTRIAXone  (ROCEPHIN ) 2 g in sodium chloride  0.9 % 100 mL IVPB, 2 g, Intravenous, Q24H, Corrinne Din, MD, Stopped at 03/01/24 1057   enoxaparin  (LOVENOX ) injection 40 mg,  40 mg, Subcutaneous, Q24H, Corrinne Din, MD, 40 mg at 03/01/24 1004   HYDROmorphone  (DILAUDID ) injection 0.5 mg, 0.5 mg, Intravenous, Q4H PRN, Corrinne Din, MD   lactated ringers  infusion, , Intravenous, Continuous, Ward, Clover Dao, DO, Last Rate: 150 mL/hr at 03/01/24 0818, New Bag at 03/01/24 0818   metroNIDAZOLE  (FLAGYL ) IVPB 500 mg, 500 mg, Intravenous, Q12H, Corrinne Din, MD, Stopped at 03/01/24 1431   nicotine  (NICODERM CQ  - dosed in mg/24 hours) patch 14 mg, 14 mg, Transdermal, Daily, Corrinne Din, MD, 14 mg at 03/01/24 1005   ondansetron  (ZOFRAN ) tablet 4 mg, 4 mg, Oral, Q6H PRN **OR** ondansetron  (ZOFRAN ) injection 4 mg, 4 mg, Intravenous, Q6H PRN, Corrinne Din, MD   pantoprazole  (PROTONIX ) injection 40 mg, 40 mg, Intravenous, Q12H, Corrinne Din, MD, 40 mg at 03/01/24 1004   promethazine  (PHENERGAN ) 12.5 mg in sodium chloride  0.9 % 50 mL IVPB, 12.5 mg, Intravenous, Q6H PRN, Lanetta Pion, MD  Current Outpatient Medications:    acetaminophen  (TYLENOL ) 325 MG tablet, Take 2 tablets (650 mg total) by mouth 3 (three) times daily as needed for up to 10 days for mild pain (pain score 1-3), moderate pain (pain score 4-6), headache or fever., Disp: 30 tablet, Rfl: 0   bictegravir-emtricitabine -tenofovir  AF (BIKTARVY ) 50-200-25 MG TABS tablet, Take 1 tablet by mouth daily., Disp: 30 tablet, Rfl: 5   gabapentin  (NEURONTIN ) 300 MG capsule, Take 2 capsules (600 mg total)  by mouth 3 (three) times daily for 14 days., Disp: 84 capsule, Rfl: 0   meloxicam  (MOBIC ) 15 MG tablet, Take 1 tablet (15 mg total) by mouth daily for 14 days., Disp: 14 tablet, Rfl: 0   QUEtiapine  (SEROQUEL ) 50 MG tablet, Take 1 tablet (50 mg total) by mouth 2 (two) times daily., Disp: 60 tablet, Rfl: 3    ALLERGIES   Tegretol  [carbamazepine ] and Caffeine    REVIEW OF SYSTEMS    10 point ROS done and is negative except as per subjective findings  PHYSICAL EXAMINATION   Vital Signs: Temp:  [97.6 F (36.4 C)-99.1 F (37.3 C)] 97.6 F (36.4 C) (04/23 1444) Pulse Rate:  [42-86] 49 (04/23 1635) Resp:  [10-49] 14 (04/23 1635) BP: (72-118)/(50-87) 85/59 (04/23 1630) SpO2:  [97 %-100 %] 100 % (04/23 1635) Weight:  [63.5 kg] 63.5 kg (04/23 0023)  GENERAL:Appears older then stated age in mild distress due to abd pain HEAD: Normocephalic, atraumatic.  EYES: Pupils equal, round, reactive to light.  No scleral icterus.  MOUTH: Moist mucosal membrane. NECK: Supple. No thyromegaly. No nodules. No JVD.  PULMONARY: decreased air entry bilaterally  CARDIOVASCULAR: S1 and S2. Regular rate and rhythm. No murmurs, rubs, or gallops.  GASTROINTESTINAL: Soft, nontender, non-distended. No masses. Positive bowel sounds. No hepatosplenomegaly.  MUSCULOSKELETAL: No swelling, clubbing, or edema.  NEUROLOGIC: Mild distress due to acute illness SKIN:intact,warm,dry   PERTINENT DATA     Infusions:  cefTRIAXone  (ROCEPHIN )  IV Stopped (03/01/24 1057)   lactated ringers  150 mL/hr at 03/01/24 0818   metronidazole  Stopped (03/01/24 1431)   promethazine  (PHENERGAN ) injection (IM or IVPB)     Scheduled Medications:  enoxaparin  (LOVENOX ) injection  40 mg Subcutaneous Q24H   nicotine   14 mg Transdermal Daily   pantoprazole  (PROTONIX ) IV  40 mg Intravenous Q12H   PRN Medications: HYDROmorphone  (DILAUDID ) injection, ondansetron  **OR** ondansetron  (ZOFRAN ) IV, promethazine  (PHENERGAN ) injection (IM  or IVPB) Hemodynamic parameters:   Intake/Output: No intake/output data recorded.  Ventilator  Settings:  LAB RESULTS:  Basic Metabolic Panel: Recent Labs  Lab 02/25/24 1148 02/25/24 1501 02/26/24 0520 02/27/24 0338 02/28/24 0608 03/01/24 0111  NA 133*  --  133* 137 139 136  K 3.7  --  4.4 4.1 4.0 3.7  CL 100  --  104 107 109 98  CO2 22  --  23 25 25 23   GLUCOSE 99  --  110* 89 99 93  BUN 15  --  12 8 6 10   CREATININE 0.89  --  0.77 0.71 0.66 0.90  CALCIUM  9.0  --  8.4* 9.0 8.5* 10.5*  MG 2.2  --   --  1.9 1.9  --   PHOS  --  3.7  --  3.4 3.3  --    Liver Function Tests: Recent Labs  Lab 02/24/24 1914 02/25/24 1148 02/27/24 0338 02/28/24 0608 03/01/24 0111  AST 20 19 12* 15 22  ALT 13 13 11 10 13   ALKPHOS 65 54 44 40 75  BILITOT 0.8 0.9 0.5 0.3 1.2  PROT 8.3* 7.7 6.1* 5.6* 9.4*  ALBUMIN 4.2 4.1 3.2* 2.8* 5.2*   Recent Labs  Lab 02/24/24 1914 02/25/24 1148 03/01/24 0111  LIPASE 32 33 35   No results for input(s): "AMMONIA" in the last 168 hours. CBC: Recent Labs  Lab 02/24/24 1914 02/25/24 1033 02/25/24 1124 02/26/24 0520 02/27/24 0338 02/28/24 0608 03/01/24 0111  WBC 5.8   < > 3.8* 4.0 2.2* 2.6* 5.8  NEUTROABS 4.6  --  2.4  --  1.0* 1.2* 3.1  HGB 13.3   < > 10.5* 9.6* 10.4* 9.4* 13.7  HCT 38.4*   < > 33.0* 28.8* 31.4* 28.1* 40.4  MCV 90.4   < > 97.9 93.5 92.9 90.9 90.8  PLT 265   < > 210 176 163 172 446*   < > = values in this interval not displayed.   Cardiac Enzymes: No results for input(s): "CKTOTAL", "CKMB", "CKMBINDEX", "TROPONINI" in the last 168 hours. BNP: Invalid input(s): "POCBNP" CBG: No results for input(s): "GLUCAP" in the last 168 hours.    ASSESSMENT AND PLAN    -Multidisciplinary rounds held today  Circulatory shock    -present on admission due to anorexia and GI losses from chronic diarreah     - patient with alcoholism and chronic pancreatitis    - his baseline is with BMI 18 and hypotension may be new  normal    - continue midodrine  for now 5mg  PO TID -  s/p 3L IV fluids   Chronic pancreatitis   - initiate pancreatic replacement enzyme -monitor for refeeding syndrome  - anaglesia therapy     Renal Failure-most likely due to ATN -follow chem 7 -follow UO -continue Foley Catheter-assess need daily   Chronic alcoholism    -folate and thiamine     - no signs of withdrawal at this moment     - librium  10mg  QID   HIV infection  - continue home BIktravy   ID -continue IV abx as prescibed -follow up cultures  GI/Nutrition GI PROPHYLAXIS as indicated DIET-->TF's as tolerated Constipation protocol as indicated  ENDO - ICU hypoglycemic\Hyperglycemia protocol -check FSBS per protocol   ELECTROLYTES -follow labs as needed -replace as needed -pharmacy consultation   DVT/GI PRX ordered -SCDs  TRANSFUSIONS AS NEEDED MONITOR FSBS ASSESS the need for LABS as needed    Critical care provider statement:   Total critical care time: 33 minutes   Performed by: Jaclynn Mast MD   Critical care time was  exclusive of separately billable procedures and treating other patients.   Critical care was necessary to treat or prevent imminent or life-threatening deterioration.   Critical care was time spent personally by me on the following activities: development of treatment plan with patient and/or surrogate as well as nursing, discussions with consultants, evaluation of patient's response to treatment, examination of patient, obtaining history from patient or surrogate, ordering and performing treatments and interventions, ordering and review of laboratory studies, ordering and review of radiographic studies, pulse oximetry and re-evaluation of patient's condition.    Paislynn Hegstrom, M.D.  Pulmonary & Critical Care Medicine

## 2024-03-01 NOTE — ED Notes (Signed)
 Patient transported to MRI

## 2024-03-02 ENCOUNTER — Inpatient Hospital Stay: Payer: MEDICAID

## 2024-03-02 DIAGNOSIS — K859 Acute pancreatitis without necrosis or infection, unspecified: Secondary | ICD-10-CM | POA: Diagnosis not present

## 2024-03-02 DIAGNOSIS — K861 Other chronic pancreatitis: Secondary | ICD-10-CM | POA: Diagnosis not present

## 2024-03-02 LAB — MAGNESIUM: Magnesium: 2.2 mg/dL (ref 1.7–2.4)

## 2024-03-02 LAB — CBC
HCT: 31.7 % — ABNORMAL LOW (ref 39.0–52.0)
Hemoglobin: 10.9 g/dL — ABNORMAL LOW (ref 13.0–17.0)
MCH: 30.8 pg (ref 26.0–34.0)
MCHC: 34.4 g/dL (ref 30.0–36.0)
MCV: 89.5 fL (ref 80.0–100.0)
Platelets: 250 10*3/uL (ref 150–400)
RBC: 3.54 MIL/uL — ABNORMAL LOW (ref 4.22–5.81)
RDW: 13.8 % (ref 11.5–15.5)
WBC: 2.9 10*3/uL — ABNORMAL LOW (ref 4.0–10.5)
nRBC: 0 % (ref 0.0–0.2)

## 2024-03-02 LAB — GLUCOSE, CAPILLARY: Glucose-Capillary: 74 mg/dL (ref 70–99)

## 2024-03-02 LAB — COMPREHENSIVE METABOLIC PANEL WITH GFR
ALT: 9 U/L (ref 0–44)
AST: 12 U/L — ABNORMAL LOW (ref 15–41)
Albumin: 3.1 g/dL — ABNORMAL LOW (ref 3.5–5.0)
Alkaline Phosphatase: 44 U/L (ref 38–126)
Anion gap: 5 (ref 5–15)
BUN: <5 mg/dL — ABNORMAL LOW (ref 6–20)
CO2: 24 mmol/L (ref 22–32)
Calcium: 8.6 mg/dL — ABNORMAL LOW (ref 8.9–10.3)
Chloride: 108 mmol/L (ref 98–111)
Creatinine, Ser: 0.79 mg/dL (ref 0.61–1.24)
GFR, Estimated: >60 mL/min (ref >60)
Glucose, Bld: 93 mg/dL (ref 70–99)
Potassium: 3.9 mmol/L (ref 3.5–5.1)
Sodium: 137 mmol/L (ref 135–145)
Total Bilirubin: 0.5 mg/dL (ref 0.0–1.2)
Total Protein: 6 g/dL — ABNORMAL LOW (ref 6.5–8.1)

## 2024-03-02 LAB — MRSA NEXT GEN BY PCR, NASAL: MRSA by PCR Next Gen: NOT DETECTED

## 2024-03-02 LAB — LACTIC ACID, PLASMA: Lactic Acid, Venous: 1 mmol/L (ref 0.5–1.9)

## 2024-03-02 LAB — PHOSPHORUS: Phosphorus: 4 mg/dL (ref 2.5–4.6)

## 2024-03-02 MED ORDER — SODIUM CHLORIDE 0.9 % IV BOLUS
1000.0000 mL | Freq: Once | INTRAVENOUS | Status: AC
  Administered 2024-03-02: 1000 mL via INTRAVENOUS

## 2024-03-02 MED ORDER — BICTEGRAVIR-EMTRICITAB-TENOFOV 50-200-25 MG PO TABS
1.0000 | ORAL_TABLET | Freq: Every day | ORAL | Status: DC
  Administered 2024-03-02 – 2024-03-05 (×4): 1 via ORAL
  Filled 2024-03-02 (×4): qty 1

## 2024-03-02 MED ORDER — CHLORHEXIDINE GLUCONATE CLOTH 2 % EX PADS
6.0000 | MEDICATED_PAD | Freq: Every day | CUTANEOUS | Status: DC
  Administered 2024-03-03: 6 via TOPICAL

## 2024-03-02 MED ORDER — ENSURE MAX PROTEIN PO LIQD
11.0000 [oz_av] | Freq: Three times a day (TID) | ORAL | Status: DC
  Administered 2024-03-02 – 2024-03-05 (×8): 11 [oz_av] via ORAL
  Filled 2024-03-02: qty 330

## 2024-03-02 NOTE — Consult Note (Signed)
 CRITICAL CARE PROGRESS NOTE    Name: FYNN VANBLARCOM MRN: [id number] DOB: 05/20/1986     LOS: 1   SUBJECTIVE FINDINGS & SIGNIFICANT EVENTS    History of Presenting Illness:  38 yo with PMH of HIV, alcohol dependence, schizophrenia, chronic pancreatitis, frequent admissions to the hospital presenting w/ acute on chronic pancreatitis, SBO.  Patient noted to have multiple recurrent admissions for ETOH withdrawal. He complains of abdominal pain and symptoms of withdrawal. He was found to be in circulatory shock requiring pressor support and PCCM has been consulted for shock.   03/02/24- abdominal pain is improved this am, he remains on midodrine  10 TID and IVF.  Pharmacy consultant for electrolyte derrangement.  RD on case plan for post pyloric NGT. Palliative care consultation due to very poor prognosis.   Lines/tubes   Microbiology/Sepsis markers: Results for orders placed or performed during the hospital encounter of 03/01/24  Blood Culture (routine x 2)     Status: None (Preliminary result)   Collection Time: 03/01/24  1:11 AM   Specimen: BLOOD  Result Value Ref Range Status   Specimen Description BLOOD LA  Final   Special Requests   Final    BOTTLES DRAWN AEROBIC AND ANAEROBIC Blood Culture results may not be optimal due to an inadequate volume of blood received in culture bottles   Culture   Final    NO GROWTH 1 DAY Performed at [facility], [address] [place], [place] [address]    Report Status PENDING  Incomplete  Blood Culture (routine x 2)     Status: None (Preliminary result)   Collection Time: 03/01/24  2:08 AM   Specimen: BLOOD  Result Value Ref Range Status   Specimen Description BLOOD LA  Final   Special Requests   Final    BOTTLES DRAWN AEROBIC AND ANAEROBIC Blood Culture  results may not be optimal due to an inadequate volume of blood received in culture bottles   Culture   Final    NO GROWTH 1 DAY Performed at [facility], [address] [place], [place] [address]    Report Status PENDING  Incomplete  Resp panel by RT-PCR (RSV, Flu A&B, Covid) Anterior Nasal Swab     Status: None   Collection Time: 03/01/24  2:19 AM   Specimen: Anterior Nasal Swab  Result Value Ref Range Status   SARS Coronavirus 2 by RT PCR NEGATIVE NEGATIVE Final    Comment: (NOTE) SARS-CoV-2 target nucleic acids are NOT DETECTED.  The SARS-CoV-2 RNA is generally detectable in upper respiratory specimens during the acute phase of infection. The lowest concentration of SARS-CoV-2 viral copies this assay can detect is 138 copies/mL. A negative result does not preclude SARS-Cov-2 infection and should not be used as the sole basis for treatment or other patient management decisions. A negative result may occur with  improper specimen collection/handling, submission of specimen other than nasopharyngeal swab, presence of viral mutation(s) within the areas targeted by this assay, and inadequate number of viral copies(<138 copies/mL). A negative result must be combined with clinical observations, patient history, and epidemiological information. The expected result is Negative.  Fact Sheet for Patients:  BloggerCourse.com  Fact Sheet for Healthcare Providers:  SeriousBroker.it  This test is no t yet approved or cleared by the [place]  FDA and  has been authorized for detection and/or diagnosis of SARS-CoV-2 by FDA under an Emergency Use Authorization (EUA). This EUA will remain  in effect (meaning this test can  be used) for the duration of the COVID-19 declaration under Section 564(b)(1) of the Act, 21 U.S.C.section 360bbb-3(b)(1), unless the authorization is terminated  or revoked sooner.       Influenza A by PCR  NEGATIVE NEGATIVE Final   Influenza B by PCR NEGATIVE NEGATIVE Final    Comment: (NOTE) The Xpert Xpress SARS-CoV-2/FLU/RSV plus assay is intended as an aid in the diagnosis of influenza from Nasopharyngeal swab specimens and should not be used as a sole basis for treatment. Nasal washings and aspirates are unacceptable for Xpert Xpress SARS-CoV-2/FLU/RSV testing.  Fact Sheet for Patients: BloggerCourse.com  Fact Sheet for Healthcare Providers: SeriousBroker.it  This test is not yet approved or cleared by the [place]  FDA and has been authorized for detection and/or diagnosis of SARS-CoV-2 by FDA under an Emergency Use Authorization (EUA). This EUA will remain in effect (meaning this test can be used) for the duration of the COVID-19 declaration under Section 564(b)(1) of the Act, 21 U.S.C. section 360bbb-3(b)(1), unless the authorization is terminated or revoked.     Resp Syncytial Virus by PCR NEGATIVE NEGATIVE Final    Comment: (NOTE) Fact Sheet for Patients: BloggerCourse.com  Fact Sheet for Healthcare Providers: SeriousBroker.it  This test is not yet approved or cleared by the [place]  FDA and has been authorized for detection and/or diagnosis of SARS-CoV-2 by FDA under an Emergency Use Authorization (EUA). This EUA will remain in effect (meaning this test can be used) for the duration of the COVID-19 declaration under Section 564(b)(1) of the Act, 21 U.S.C. section 360bbb-3(b)(1), unless the authorization is terminated or revoked.  Performed at [facility], [address] [place], [place] [address]   C Difficile Quick Screen w PCR reflex     Status: None   Collection Time: 03/01/24  8:12 AM   Specimen: STOOL  Result Value Ref Range Status   C Diff antigen NEGATIVE NEGATIVE Final   C Diff toxin NEGATIVE NEGATIVE Final   C Diff interpretation No  C. difficile detected.  Final    Comment: Performed at [facility], [address] [place], [place] [address]  Gastrointestinal Panel by PCR , Stool     Status: None   Collection Time: 03/01/24  8:12 AM   Specimen: STOOL  Result Value Ref Range Status   Campylobacter species NOT DETECTED NOT DETECTED Final   Plesimonas shigelloides NOT DETECTED NOT DETECTED Final   Salmonella species NOT DETECTED NOT DETECTED Final   Yersinia enterocolitica NOT DETECTED NOT DETECTED Final   Vibrio species NOT DETECTED NOT DETECTED Final   Vibrio cholerae NOT DETECTED NOT DETECTED Final   Enteroaggregative E coli (EAEC) NOT DETECTED NOT DETECTED Final   Enteropathogenic E coli (EPEC) NOT DETECTED NOT DETECTED Final   Enterotoxigenic E coli (ETEC) NOT DETECTED NOT DETECTED Final   Shiga like toxin producing E coli (STEC) NOT DETECTED NOT DETECTED Final   Shigella/Enteroinvasive E coli (EIEC) NOT DETECTED NOT DETECTED Final   Cryptosporidium NOT DETECTED NOT DETECTED Final   Cyclospora cayetanensis NOT DETECTED NOT DETECTED Final   Entamoeba histolytica NOT DETECTED NOT DETECTED Final   Giardia lamblia NOT DETECTED NOT DETECTED Final   Adenovirus F40/41 NOT DETECTED NOT DETECTED Final   Astrovirus NOT DETECTED NOT DETECTED Final   Norovirus GI/GII NOT DETECTED NOT DETECTED Final   Rotavirus A NOT DETECTED NOT DETECTED Final   Sapovirus (I, II, IV, and V) NOT DETECTED NOT DETECTED Final    Comment: Performed at [facility],  [address], [place], [place] [address]  MRSA Next Gen by PCR, Nasal     Status: None   Collection Time: 03/02/24  5:32 AM   Specimen: Nasal Mucosa; Nasal Swab  Result Value Ref Range Status   MRSA by PCR Next Gen NOT DETECTED NOT DETECTED Final    Comment: (NOTE) The GeneXpert MRSA Assay (FDA approved for NASAL specimens only), is one component of a comprehensive MRSA colonization surveillance program. It is not intended to diagnose MRSA infection nor to  guide or monitor treatment for MRSA infections. Test performance is not FDA approved in patients less than 103 years old. Performed at [facility], [address] [place], [place] [address]     Anti-infectives:  Anti-infectives (From admission, onward)    Start     Dose/Rate Route Frequency Ordered Stop   03/02/24 1100  bictegravir-emtricitabine -tenofovir  AF (BIKTARVY ) 50-200-25 MG per tablet 1 tablet        1 tablet Oral Daily 03/02/24 0948     03/01/24 1300  metroNIDAZOLE  (FLAGYL ) IVPB 500 mg        500 mg 100 mL/hr over 60 Minutes Intravenous Every 12 hours 03/01/24 0742     03/01/24 1000  cefTRIAXone  (ROCEPHIN ) 2 g in sodium chloride  0.9 % 100 mL IVPB        2 g 200 mL/hr over 30 Minutes Intravenous Every 24 hours 03/01/24 0742     03/01/24 0045  ceFEPIme  (MAXIPIME ) 2 g in sodium chloride  0.9 % 100 mL IVPB        2 g 200 mL/hr over 30 Minutes Intravenous  Once 03/01/24 0037 03/01/24 0327   03/01/24 0045  metroNIDAZOLE  (FLAGYL ) IVPB 500 mg        500 mg 100 mL/hr over 60 Minutes Intravenous  Once 03/01/24 0037 03/01/24 0441   03/01/24 0045  vancomycin  (VANCOCIN ) IVPB 1000 mg/200 mL premix        1,000 mg 200 mL/hr over 60 Minutes Intravenous  Once 03/01/24 0037 03/01/24 0600        Consults: Treatment Team:  Eldred Grego, MD     PAST MEDICAL HISTORY   Past Medical History:  Diagnosis Date   Alcohol abuse    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   Anxiety    Bipolar 2 disorder (HCC)    HIV (human immunodeficiency virus infection) (HCC)    Pancreatitis    PTSD (post-traumatic stress disorder)    Schizophrenia (HCC)    Seizures (HCC)    Subdural hematoma (HCC)      SURGICAL HISTORY   Past Surgical History:  Procedure Laterality Date   BIOPSY  04/19/2022   Procedure: BIOPSY;  Surgeon: Normie Becton., MD;  Location: [facility] ENDOSCOPY;  Service: Gastroenterology;;   ENTEROSCOPY N/A 04/19/2022   Procedure: ENTEROSCOPY;  Surgeon:  Normie Becton., MD;  Location: [facility] ENDOSCOPY;  Service: Gastroenterology;  Laterality: N/A;   INCISION AND DRAINAGE PERIRECTAL ABSCESS N/A 09/24/2016   Procedure: IRRIGATION AND DEBRIDEMENT PERIRECTAL ABSCESS;  Surgeon: Gwyndolyn Lerner, MD;  Location: [facility] ORS;  Service: General;  Laterality: N/A;   none       FAMILY HISTORY   Family History  Problem Relation Age of Onset   Hypertension Mother    Alcohol abuse Mother    Alcoholism Mother    Alcohol abuse Father    Colon cancer Other    Other Other    Cancer Other      SOCIAL HISTORY   Social History   Tobacco Use  Smoking status: Every Day    Current packs/day: 0.50    Average packs/day: 0.5 packs/day for 21.3 years (10.7 ttl pk-yrs)    Types: Cigarettes    Start date: 2004   Smokeless tobacco: Never   Tobacco comments:    unable to smoke while incarcerated 6+ months 02/13/20  Vaping Use   Vaping status: Never Used  Substance Use Topics   Alcohol use: Yes    Alcohol/week: 105.0 standard drinks of alcohol    Types: 105 Cans of beer per week    Comment: drinks every day, all day, "whatever I can get my hands on". "Half a gallon of vodka" 12/16   Drug use: Not Currently    Types: Methamphetamines, Cocaine    Comment: last used 10/22/2023     MEDICATIONS   Current Medication:  Current Facility-Administered Medications:    bictegravir-emtricitabine -tenofovir  AF (BIKTARVY ) 50-200-25 MG per tablet 1 tablet, 1 tablet, Oral, Daily, Adalberto Acton, RPH   cefTRIAXone  (ROCEPHIN ) 2 g in sodium chloride  0.9 % 100 mL IVPB, 2 g, Intravenous, Q24H, Corrinne Din, MD, Stopped at 03/01/24 1057   chlordiazePOXIDE  (LIBRIUM ) capsule 10 mg, 10 mg, Oral, QID, Ousmane Seeman, MD, 10 mg at 03/02/24 1005   Chlorhexidine  Gluconate Cloth 2 % PADS 6 each, 6 each, Topical, Daily, Corrinne Din, MD   enoxaparin  (LOVENOX ) injection 40 mg, 40 mg, Subcutaneous, Q24H, Corrinne Din, MD, 40 mg at 03/02/24 4098   folic acid   (FOLVITE ) tablet 1 mg, 1 mg, Oral, Daily, Corrinne Din, MD, 1 mg at 03/02/24 1005   HYDROmorphone  (DILAUDID ) injection 0.5 mg, 0.5 mg, Intravenous, Q4H PRN, Corrinne Din, MD, 0.5 mg at 03/02/24 1191   lipase/protease/amylase (CREON ) capsule 24,000 Units, 24,000 Units, Oral, TID AC, Latoya Diskin, MD, 24,000 Units at 03/02/24 0809   metroNIDAZOLE  (FLAGYL ) IVPB 500 mg, 500 mg, Intravenous, Q12H, Corrinne Din, MD, Stopped at 03/02/24 0127   midodrine  (PROAMATINE ) tablet 10 mg, 10 mg, Oral, TID, Corrinne Din, MD, 10 mg at 03/02/24 0810   multivitamin with minerals tablet 1 tablet, 1 tablet, Oral, Daily, Corrinne Din, MD, 1 tablet at 03/02/24 1005   nicotine  (NICODERM CQ  - dosed in mg/24 hours) patch 14 mg, 14 mg, Transdermal, Daily, Corrinne Din, MD, 14 mg at 03/02/24 4782   ondansetron  (ZOFRAN ) tablet 4 mg, 4 mg, Oral, Q6H PRN **OR** ondansetron  (ZOFRAN ) injection 4 mg, 4 mg, Intravenous, Q6H PRN, Corrinne Din, MD   pantoprazole  (PROTONIX ) injection 40 mg, 40 mg, Intravenous, Q12H, Corrinne Din, MD, 40 mg at 03/02/24 9562   promethazine  (PHENERGAN ) 12.5 mg in sodium chloride  0.9 % 50 mL IVPB, 12.5 mg, Intravenous, Q6H PRN, Lanetta Pion, MD   thiamine  (VITAMIN B1) tablet 100 mg, 100 mg, Oral, Daily, 100 mg at 03/02/24 1005 **OR** thiamine  (VITAMIN B1) injection 100 mg, 100 mg, Intravenous, Daily, Corrinne Din, MD    ALLERGIES   Tegretol  [carbamazepine ] and Caffeine    REVIEW OF SYSTEMS    10 point ROS done and is negative except as per subjective findings  PHYSICAL EXAMINATION   Vital Signs: Temp:  [97.6 F (36.4 C)-98.7 F (37.1 C)] 98.3 F (36.8 C) (04/24 0700) Pulse Rate:  [42-62] 53 (04/24 1000) Resp:  [11-20] 14 (04/24 1000) BP: (72-152)/(50-73) 100/70 (04/24 1000) SpO2:  [92 %-100 %] 100 % (04/24 1000) Weight:  [57.6 kg] 57.6 kg (04/24 0522)  GENERAL:Appears older then stated age in mild distress due to abd pain  HEAD: Normocephalic,  atraumatic.  EYES: Pupils equal, round, reactive to light.  No scleral icterus.  MOUTH: Moist mucosal membrane. NECK: Supple. No thyromegaly. No nodules. No JVD.  PULMONARY: decreased air entry bilaterally  CARDIOVASCULAR: S1 and S2. Regular rate and rhythm. No murmurs, rubs, or gallops.  GASTROINTESTINAL: Soft, nontender, non-distended. No masses. Positive bowel sounds. No hepatosplenomegaly.  MUSCULOSKELETAL: No swelling, clubbing, or edema.  NEUROLOGIC: Mild distress due to acute illness SKIN:intact,warm,dry   PERTINENT DATA     Infusions:  cefTRIAXone  (ROCEPHIN )  IV Stopped (03/01/24 1057)   metronidazole  Stopped (03/02/24 0127)   promethazine  (PHENERGAN ) injection (IM or IVPB)     Scheduled Medications:  bictegravir-emtricitabine -tenofovir  AF  1 tablet Oral Daily   chlordiazePOXIDE   10 mg Oral QID   Chlorhexidine  Gluconate Cloth  6 each Topical Daily   enoxaparin  (LOVENOX ) injection  40 mg Subcutaneous Q24H   folic acid   1 mg Oral Daily   lipase/protease/amylase  24,000 Units Oral TID AC   midodrine   10 mg Oral TID   multivitamin with minerals  1 tablet Oral Daily   nicotine   14 mg Transdermal Daily   pantoprazole  (PROTONIX ) IV  40 mg Intravenous Q12H   thiamine   100 mg Oral Daily   Or   thiamine   100 mg Intravenous Daily   PRN Medications: HYDROmorphone  (DILAUDID ) injection, ondansetron  **OR** ondansetron  (ZOFRAN ) IV, promethazine  (PHENERGAN ) injection (IM or IVPB) Hemodynamic parameters:   Intake/Output: 04/23 0701 - 04/24 0700 In: 200 [IV Piggyback:200] Out: 4650 [Urine:4650]  Ventilator  Settings:     LAB RESULTS:  Basic Metabolic Panel: Recent Labs  Lab 02/25/24 1148 02/25/24 1501 02/26/24 0520 02/27/24 0338 02/28/24 0608 03/01/24 0111 03/01/24 1950 03/02/24 0549  NA 133*  --    < > 137 139 136 137 137  K 3.7  --    < > 4.1 4.0 3.7 4.0 3.9  CL 100  --    < > 107 109 98 112* 108  CO2 22  --    < > 25 25 23  20* 24  GLUCOSE 99  --    < > 89 99  93 92 93  BUN 15  --    < > 8 6 10 6  <5*  CREATININE 0.89  --    < > 0.71 0.66 0.90 0.61 0.79  CALCIUM  9.0  --    < > 9.0 8.5* 10.5* 8.2* 8.6*  MG 2.2  --   --  1.9 1.9  --  1.6* 2.2  PHOS  --  3.7  --  3.4 3.3  --  3.6 4.0   < > = values in this interval not displayed.   Liver Function Tests: Recent Labs  Lab 02/27/24 0338 02/28/24 0608 03/01/24 0111 03/01/24 1950 03/02/24 0549  AST 12* 15 22 11* 12*  ALT 11 10 13 10 9   ALKPHOS 44 40 75 41 44  BILITOT 0.5 0.3 1.2 0.5 0.5  PROT 6.1* 5.6* 9.4* 5.4* 6.0*  ALBUMIN 3.2* 2.8* 5.2* 2.8* 3.1*   Recent Labs  Lab 02/24/24 1914 02/25/24 1148 03/01/24 0111  LIPASE 32 33 35   No results for input(s): "AMMONIA" in the last 168 hours. CBC: Recent Labs  Lab 02/24/24 1914 02/25/24 1033 02/25/24 1124 02/26/24 0520 02/27/24 0338 02/28/24 0608 03/01/24 0111 03/02/24 0549  WBC 5.8   < > 3.8* 4.0 2.2* 2.6* 5.8 2.9*  NEUTROABS 4.6  --  2.4  --  1.0* 1.2* 3.1  --   HGB 13.3   < >  10.5* 9.6* 10.4* 9.4* 13.7 10.9*  HCT 38.4*   < > 33.0* 28.8* 31.4* 28.1* 40.4 31.7*  MCV 90.4   < > 97.9 93.5 92.9 90.9 90.8 89.5  PLT 265   < > 210 176 163 172 446* 250   < > = values in this interval not displayed.   Cardiac Enzymes: No results for input(s): "CKTOTAL", "CKMB", "CKMBINDEX", "TROPONINI" in the last 168 hours. BNP: Invalid input(s): "POCBNP" CBG: Recent Labs  Lab 03/02/24 0517  GLUCAP 74      ASSESSMENT AND PLAN    -Multidisciplinary rounds held today  Circulatory shock- RESOLVED    -present on admission due to anorexia and GI losses from chronic diarreah     - patient with alcoholism and chronic pancreatitis    - his baseline is with BMI 18 and hypotension may be new normal    - continue midodrine  for now 5-10mg  PO TID -  s/p 3L IV fluids   Chronic pancreatitis   - initiate pancreatic replacement enzyme -monitor for refeeding syndrome  - anaglesia therapy  - RD - post pyloric NGT    Chronic alcoholism    -folate  and thiamine     - no signs of withdrawal at this moment     - librium  10mg  QID   HIV infection  - continue home BIktravy   ID -continue IV abx as prescibed -follow up cultures  GI/Nutrition GI PROPHYLAXIS as indicated DIET-->TF's as tolerated Constipation protocol as indicated  ENDO - ICU hypoglycemic\Hyperglycemia protocol -check FSBS per protocol   ELECTROLYTES -follow labs as needed -replace as needed -pharmacy consultation   DVT/GI PRX ordered -SCDs  TRANSFUSIONS AS NEEDED MONITOR FSBS ASSESS the need for LABS as needed    Critical care provider statement:   Total critical care time: 33 minutes   Performed by: Jaclynn Mast MD   Critical care time was exclusive of separately billable procedures and treating other patients.   Critical care was necessary to treat or prevent imminent or life-threatening deterioration.   Critical care was time spent personally by me on the following activities: development of treatment plan with patient and/or surrogate as well as nursing, discussions with consultants, evaluation of patient's response to treatment, examination of patient, obtaining history from patient or surrogate, ordering and performing treatments and interventions, ordering and review of laboratory studies, ordering and review of radiographic studies, pulse oximetry and re-evaluation of patient's condition.    Tonimarie Gritz, M.D.  Pulmonary & Critical Care Medicine

## 2024-03-02 NOTE — Progress Notes (Signed)
 Progress Note   Patient: Gary [name] HQI:[id number] DOB: [birth year] DOA: 03/01/2024     1 DOS: the patient was seen and examined on 03/02/2024   Brief hospital course: From HPI "Gary [name] is a 38 y.o. male with medical history significant of HIV, alcohol dependence, schizophrenia, chronic pancreatitis, frequent admissions to the hospital presenting w/ acute on chronic pancreatitis, SBO.  Patient noted to have multiple recurrent admissions for similar issues roughly 3+ admissions in April thus far.  This will be admission #4.  Patient reports generalized abdominal pain with past 4 to 5 days.  Last alcohol intake was roughly 4 days ago with 1 bottle of wine.  Denies any illicit drug use.  No chest pain or shortness of breath.  Mild nausea.  Denies any SI/HI.  No fevers or chills.  Mild diarrhea.  Symptoms similar to previous episodes of pancreatitis.  Denies any illicit drug use. Presented to the ER afebrile, hemodynamically stable.  Satting well on room air.  White count 6, hemoglobin 13.7, platelets 446.  White count 2.4-1.8.  COVID flu and RSV negative.  Alcohol level within normal limits.  Creatinine 0.9.  CT with acute pancreatitis with concern for interval complex pancreatic pseudocyst formation-recommending MRI.  Also with small bowel obstruction on imaging.  Diffuse urinary bladder wall thickening concerning for possible cystitis.  "  Assessment and Plan: Acute on chronic pancreatitis (HCC) Recurrent episodes of pancreatitis in setting of heavy ETOH use  CT imaging consistent with acute pancreatitis and new complex pancreatic pseudocyst Patient has been seen by surgeon and no surgery being planned given no findings of obstruction on repeat imaging Continue as needed pain medication Continue IV fluid MRI showing findings of acute pancreatitis WBC grossly stable  I appreciate input by ICU team We will consider discontinuation of antibiotics if no evidence of infection found        Alcohol dependence with withdrawal (HCC) Alcohol level within normal limits Noted history of maladaptive behavior patterns concerning for narcotic and sedating medication abuse Will place on CIWA protocol for now Minimize anxiolytic use only as appropriate given history Monitor   HIV disease (HCC) Cont home biktarvy      Ileus (HCC) Noted partial small bowel obstruction versus ileus on imaging.  General Surgery on board No surgery being planned given no concerns of obstruction     Polysubstance abuse (HCC) Urine drug screen pending      Nicotine  dependence, cigarettes, uncomplicated Nicotine  patch      High risk for readmission Malingering Baseline history of Frequent admissions secondary to pancreatitis, alcohol withdrawal, and pain seeking behaviors.  Last admission 4/18-4/21 Noted concurrent acute on chronic pancreatitis with pancreatic pseudocyst Minimize narcotics as appropriate Follow closely   Gastritis and duodenitis IV PPI   Severe episode of recurrent major depressive disorder, without psychotic features (HCC) Mood stable  No HI/SI  Cont seroquel  once po status improves     Advance Care Planning:   Code Status: Full Code    Consults: None    Family Communication: No family at the bedside       Subjective:  Patient seen and examined at bedside this morning Denies nausea vomiting abdominal pain chest pain or cough  Physical Exam: HENT:     Head: Normocephalic and atraumatic.     Nose: Nose normal.     Mouth/Throat:     Mouth: Mucous membranes are dry.  Eyes:     Pupils: Pupils are equal, round, and reactive to light.  Cardiovascular:     Rate and Rhythm: Normal rate and regular rhythm.  Pulmonary:     Effort: Pulmonary effort is normal.  Abdominal:     General: Bowel sounds are normal.     Comments: Positive mild generalized abdominal pain.  Musculoskeletal:        General: Normal range of motion.  Skin:    General: Skin is dry.   Neurological:     General: No focal deficit present.  Psychiatric:        Mood and Affect: Mood normal.    Vitals:   03/02/24 1330 03/02/24 1400 03/02/24 1430 03/02/24 1500  BP: (!) 89/58 (!) 91/59 (!) 90/59 94/62  Pulse: (!) 53 (!) 46 (!) 45 (!) 44  Resp: 17 18 17 18   Temp:    98.8 F (37.1 C)  TempSrc:      SpO2: 99% 100% 97% 100%  Weight:      Height:        Data Reviewed: Abdominal imaging reviewed with findings as noted above    Latest Ref Rng & Units 03/02/2024    5:49 AM 03/01/2024    1:11 AM 02/28/2024    6:08 AM  CBC  WBC 4.0 - 10.5 K/uL 2.9  5.8  2.6   Hemoglobin 13.0 - 17.0 g/dL 60.4  54.0  9.4   Hematocrit 39.0 - 52.0 % 31.7  40.4  28.1   Platelets 150 - 400 K/uL 250  446  172        Latest Ref Rng & Units 03/02/2024    5:49 AM 03/01/2024    7:50 PM 03/01/2024    1:11 AM  BMP  Glucose 70 - 99 mg/dL 93  92  93   BUN 6 - 20 mg/dL 5  6  10    Creatinine 0.61 - 1.24 mg/dL 9.81  1.91  4.78   Sodium 135 - 145 mmol/L 137  137  136   Potassium 3.5 - 5.1 mmol/L 3.9  4.0  3.7   Chloride 98 - 111 mmol/L 108  112  98   CO2 22 - 32 mmol/L 24  20  23    Calcium  8.9 - 10.3 mg/dL 8.6  8.2  29.5       Family Communication: None at bedside  Disposition: Status is: Inpatient   Planned Discharge Destination: Hopefully home  Time spent: 56 minutes  Author: Ezzard Holms, MD 03/02/2024 5:00 PM  For on call review [email or web address].

## 2024-03-02 NOTE — TOC Progression Note (Signed)
 Transition of Care [facility]) - Progression Note    Patient Details  Name: Gary [name] MRN: [id number] Date of Birth: 1985/12/13  Transition of Care [facility]) CM/SW Contact  Arminda Landmark, RN Phone Number: 03/02/2024, 9:31 AM  Clinical Narrative:    [facility] consult for SA counseling. This RNCM met with pt last week at his previous admission. SA and PCP resources placed in his AVS. TOC to continue to follow        Expected Discharge Plan and Services                                               Social Determinants of Health (SDOH) Interventions SDOH Screenings   Food Insecurity: No Food Insecurity (02/25/2024)  Recent Concern: Food Insecurity - Food Insecurity Present (02/19/2024)  Housing: Patient Declined (02/25/2024)  Recent Concern: Housing - High Risk (02/19/2024)  Transportation Needs: Patient Declined (02/25/2024)  Recent Concern: Transportation Needs - Unmet Transportation Needs (02/19/2024)  Utilities: Patient Declined (02/25/2024)  Alcohol Screen: High Risk (07/25/2021)  Depression (PHQ2-9): Medium Risk (01/28/2024)  Financial Resource Strain: Patient Declined (10/11/2023)  Social Connections: Patient Declined (02/25/2024)  Recent Concern: Social Connections - Socially Isolated (02/03/2024)  Stress: No Stress Concern Present (06/30/2022)   Received from [facility]  Recent Concern: Stress - Stress Concern Present (06/25/2022)   Received from [facility]  Tobacco Use: High Risk (03/01/2024)    Readmission Risk Interventions    02/14/2024    3:10 PM 12/15/2023   11:23 AM 11/29/2023   10:27 AM  Readmission Risk Prevention Plan  Transportation Screening Complete Complete Complete  Medication Review Oceanographer) Complete Complete Complete  PCP or Specialist appointment within 3-5 days of discharge Patient refused Complete   HRI or Home Care Consult Complete Complete Complete  SW Recovery Care/Counseling Consult Complete Complete Complete   Palliative Care Screening Not Applicable Not Applicable Not Applicable  Skilled Nursing Facility Not Applicable Not Applicable Not Applicable

## 2024-03-02 NOTE — Plan of Care (Signed)
 Patient alert and oriented. Room air, low fat diet with Endure ordered. Patient has had some abdominal discomfort, pain medication given. Using urinal, 1 liter bolus given for BP and on midodrine . Continue to assess.

## 2024-03-02 NOTE — Progress Notes (Signed)
 PHARMACY CONSULT NOTE - FOLLOW UP  Pharmacy Consult for Electrolyte Monitoring and Replacement   Recent Labs: Potassium (mmol/L)  Date Value  03/02/2024 3.9  11/15/2014 4.1   Magnesium  (mg/dL)  Date Value  45/40/9811 2.2   Calcium  (mg/dL)  Date Value  91/47/8295 8.6 (L)   Calcium , Total (mg/dL)  Date Value  62/13/0865 9.4   Albumin (g/dL)  Date Value  78/46/9629 3.1 (L)  11/15/2014 4.7   Phosphorus (mg/dL)  Date Value  52/84/1324 4.0   Sodium (mmol/L)  Date Value  03/02/2024 137  11/15/2014 138    Assessment: 38 year old male with history of HIV, alcohol dependence, schizophrenia, chronic pancreatitis, presents emergency department for chief concerns of tremors and concerns of alcohol withdrawal. Pharmacy is asked to follow and replace electrolytes  Goal of Therapy:  Electrolytes WNL  Plan:  ---no electrolyte replacement warranted for today ---recheck electrolytes in am  Adalberto Acton ,PharmD Clinical Pharmacist 03/02/2024 11:24 AM

## 2024-03-02 NOTE — Progress Notes (Signed)
 Initial Nutrition Assessment  DOCUMENTATION CODES:   Severe malnutrition in context of chronic illness  INTERVENTION:   Ensure Max protein supplement po TID, each supplement provides 150kcal and 30g of protein.  MVI, thiamine , folic acid  po daily   Low fat diet   Pt at high refeed risk; recommend monitor potassium, magnesium  and phosphorus labs daily until stable  Daily weights   NUTRITION DIAGNOSIS:   Severe Malnutrition related to chronic illness (chronic pancreatits, HIV, etoh abuse) as evidenced by severe fat depletion, severe muscle depletion.  GOAL:   Patient will meet greater than or equal to 90% of their needs  MONITOR:   PO intake, Supplement acceptance, Labs, Weight trends, Skin, I & O's  REASON FOR ASSESSMENT:   Malnutrition Screening Tool, Rounds    ASSESSMENT:   38 y/o Gary [name] with h/o HIV, chronic pancreatitis, substance abuse, MDD, etoh abuse with seizures, PTSD, SDH, bipolar disorder, schizophrenia and SI who is admitted with acute on chronic pancreatitis with pseudocyst complicated by SBO vs ileus.  Met with pt in room today. Pt reports decreased appetite and oral intake for at least the past 4 months r/t etoh abuse and abdominal pain. Pt reports ongoing issues with pancreatitis; pt reports chronic abdominal pain with intermittent diarrhea and nausea. Pt reports that he has lost ~20lbs over the past 4 months. Per chart, pt is down ~13lbs(Gary%) over the past week; this is significant weight loss. CT scan from 4/23 reporting SBO vs ileus. NGT unable to be placed by RN after multiple attempts. Pt denies any nausea today. Abdomen soft and non distended but pt reports ongoing abdominal pain. KUB from this morning reporting normal gas pattern. Pt is having diarrhea. Pt tolerating clear liquids; will advance to a low fat diet today. RD discussed with pt the importance of adequate nutrition needed to preserve lean muscle. Pt is agreeable to drinking chocolate Ensure.  Recommend fluoroscopy guided post pyloric NGT placement and nutrition support if patient is unable to tolerate an oral diet; this was discussed with patient and medical team. Pt is agreeable to NGT if needed but would like to try and eat first. Creon  initiated yesterday; pt reports that he is non-compliant with this at home. Pt is at high refeed risk.   Medications reviewed and include: lovenox , folic acid , MVI, thiamine , creon , nicotine , protonix , ceftriaxone , metronidazole   Labs reviewed: K 3.9 wnl, BUN <5(L), P 4.0 wnl, Mg 2.2 wnl Lipase- 35 wnl- 4/23 Wbc- 2.9(L), Hgb Gary.9(L), Hct 31.7(L)  NUTRITION - FOCUSED PHYSICAL EXAM:  Flowsheet Row Most Recent Value  Orbital Region Severe depletion  Upper Arm Region Severe depletion  Thoracic and Lumbar Region Severe depletion  Buccal Region Severe depletion  Temple Region Severe depletion  Clavicle Bone Region Severe depletion  Clavicle and Acromion Bone Region Severe depletion  Scapular Bone Region Severe depletion  Dorsal Hand Severe depletion  Patellar Region Severe depletion  Anterior Thigh Region Severe depletion  Posterior Calf Region Severe depletion  Edema (RD Assessment) None  Hair Reviewed  Eyes Reviewed  Mouth Reviewed  Skin Reviewed  Nails Reviewed   Diet Order:   Diet Order             Diet regular Room service appropriate? Yes; Fluid consistency: Thin  Diet effective now                  EDUCATION NEEDS:   Education needs have been addressed  Skin:  Skin Assessment: Reviewed RN Assessment  Last BM:  4/23-  type 7  Height:   Ht Readings from Last 1 Encounters:  03/02/24 6' (1.829 m)    Weight:   Wt Readings from Last 1 Encounters:  03/02/24 57.6 kg    Ideal Body Weight:  80.9 kg  BMI:  Body mass index is 17.22 kg/m.  Estimated Nutritional Needs:   Kcal:  2000-2300kcal/day  Protein:  100-115g/day  Fluid:  1.7-2.0L/day  Torrance Freestone MS, RD, LDN If unable to be reached, please send  secure chat to "RD inpatient" available from 8:00a-4:00p daily

## 2024-03-02 NOTE — Discharge Instructions (Addendum)
 Intensive Outpatient Programs   [facility] [address] [place],  [place], [place] [phone]  [facility] (Inpatient and outpatient)[phone] (Suboxone and Methadone) [address] [phone]  ADS: Alcohol & Drug [facility] Programs - Intensive Outpatient [address] [place], [place] [address], [place]  [phone]  [facility] (Outpatient, Inpatient, [facility] (Groups and Residental) (insurance only) [phone] [place], [place] [phone]   [facility] (for caregivers and family) [address] Amy Kansky [address] [place], [place] [phone]  Residential Treatment Programs  [facility](2 years) Residential: 69 days)[facility] ([facility] [address] [place], [place], [place] [phone] or [phone]  [facility] Treatment [facility] [address] [place], [place], [place] [phone]  [facility] (RTS) [address] [place], [place], [place] [phone] Admissions: 8am-3pm M-F  [facility] Program: Residential Program [phone] Days)             [facility]: [facility]  [place], [place], [place]  [phone] or [phone] in Hours over the weekend or by referral)  [facility] [address] [place], [place] [address] [phone] (Do virtual or phone assessment, offer transportation within 25 miles, have in patient and Outpatient options)   Mobil Crisis: [facility]:[phone] (for crisis  response 24 hours a day)    Some PCP options in [place] area- not a comprehensive list  [facility]- [phone] [facility]- [phone] [facility]- [phone] [facility]- [phone] [facility]- [phone] Kerney Pee- [phone]  or [facility] Health Physician Referral Line [phone]   Rent/Utility/Housing  Agency Name: [facility] Address: [address] [place], [place] [address] Phone: [phone] Email: [email or web address] Website: www.alamanceservices.org Service(s) Offered: Housing services, self-sufficiency, congregate meal program, weatherization program, Field seismologist program, emergency food assistance,  housing counseling, home ownership program, wheels -towork program.  Agency Name: [facility] Address: [address], [place], [place] [address] Phone: [phone] (8a-4p) [phone] (8p- 10p) Email: [email or web address] Website: [email or web address].[email or web address].org Service(s) Offered: A program for homeless and/or needy men that includes one-on-one counseling, life skills training and job rehabilitation.  Agency Name: [employer] [facility] [place] Address: [address], [place], [place] [address] Phone: [phone] Website: www.alliedchurches.org Service(s) Offered: Assistance to needy in emergency with utility bills, heating fuel, and prescriptions. Shelter for homeless 7pm-7am. March 04, 2017 15  Agency Name: [facility] [place] (Developmentally Disabled) Address: [address], [place], [place] [address] Phone: [phone] Contact Person: Genie Key Email: [email or web address] Website: LinkWedding.ca Service(s) Offered: Helps individuals with developmental disabilities move from housing that is more restrictive to homes where they  can achieve greater independence and have more  opportunities.  Agency Name: [employer] Address: [address], [place], [place] [address] Phone: [phone] Email: [email or web address] Website: www.burlingtonhousingauthority.org Service(s) Offered: Provides affordable housing for low-income families, elderly, and disabled individuals. Offer a wide range of  programs and services, from financial planning to afterschool and summer programs.  Agency Name: Department of Social Services Address: [address] [place], [place] [address] Phone: [phone] Service(s) Offered: Child support services; child welfare services; food stamps; Medicaid; work first family assistance; and aid with fuel,  rent, food and medicine.  Agency Name: [facility] [place] [employer]. Address: [facility] [address], [place], [place]  [address] Phone: [phone] Website: www.familyabuseservices.org Service(s) Offered: 24 hour Crisis Line: [phone]; 24 hour Emergency Shelter; Transitional Housing; Support Groups; Scientist, physiological; [employer]; Hispanic Outreach: [phone];  Visitation Center: [phone].  Agency Name: [facility], [place]. Address: [address] [place], [place] [address] Phone: [phone] Service(s) Offered: CAP Services; Home and [employer]; Individual or Group Supports; Respite Care Non-Institutional Nursing;  Residential Supports; Respite Care and Personal Care Services; Transportation; Family and Friends Night; Recreational Activities; Three Nutritious Meals/Snacks; Consultation with Registered Dietician; Twenty-four hour Registered Nurse Access; Daily and [employer] and [employer]; Camp Green Leaves; [place] for the [employer] (During Summer Months) Bingo Night (Every  Wednesday Night); Special Populations Dance Night  (Every Tuesday Night); Professional Hair Care Services.  Agency Name: [facility] Address: [address], [place], [place] [address] Phone: [phone] Contact Person:  Richardo Chandler Website: [email or web address].Physicist, medical) Offered: Residential treatment facility for women; food and  clothing, educational & employment development and  transportation to work; Counsellor of financial skills;  parenting and family reunification; emotional and spiritual  support; transitional housing for program graduates.  Agency Name: [facility] Address: [address], [place], [place] [address] Phone: [phone] Email: [email or web address] Website: TaskTown.es Service(s) Offered: Public housing units for elderly, disabled, and low income people; housing choice vouchers for income eligible  applicants; shelter plus care vouchers; and Psychologist, clinical.  Agency Name: [facility] [employer] Address: [address], [place], [place] [address] Phone: [phone] Email: habitat1@netzero .net Website: www.habitatalamance.org Service(s) Offered: Build houses for families in need of decent housing. Each adult in the family must invest 200 hours of labor on  someone else's house, work with volunteers to build their own house, attend classes on budgeting, home maintenance, yard care, and attend homeowner association meetings.  Agency Name: [facility] Address: [address], [place], [place] [address] Phone: [phone] Website: www.rsli.org Service(s) Offered: Intermediate care facilities for intellectually delayed, Supervised Living in group homes for adults with developmental disabilities, Supervised Living for people who have dual diagnoses (MRMI), Independent Living, Supported Living, respite and a variety of CAP services, pre-vocational services, day supports, and [employer].  Agency Name: [place] [employer] Prevention [employer] Phone: [phone] Website: www.NCForeclosurePrevention.gov Service(s) Offered: Zero-interest, deferred loans to homeowners struggling to  pay their mortgage. Call for more information.  Shelters Resource List  [employer] RESCUE MISSION PROVIDED BY: [facility] [address] [place], [place], [place] Offers a faith-based shelter for homeless men, usually with substance use disorders. Residents receive counseling, life skills training, and help finding a job.  HOMELESS SHELTER PROVIDED BY: [facility] [address] [place], [place], [place] Offers a shelter for men, women, and families experiencing homelessness. Food, clothing and other items are available for residents. Also offers support and services to help residents become self-sufficient. Offers temporary emergency housing for 30 days. Additional shelter may be available when temperatures drop below freezing but is not guaranteed.  FAMILY ABUSE SERVICES OF [facility] COUNTY PROVIDED BY: FAMILY ABUSE SERVICES OF [facility] [address], [place], [place] Offers services for victims of domestic violence. Offers a 24-hour crisis line and emergency shelter. Offers information and referrals to other community resources. Also offers court advocacy and support groups.  HOUSING CHOICE VOUCHER PROGRAM PROVIDED BY: [facility] [place] [address], [place], [place] Offers vouchers for approved Section 8 properties. Vouchers offer financial help with rent  FRUIT TREE MINISTRIES PROVIDED BY: [facility], [place], [place] Offers emergency shelter for victims of domestic violence. Also offers a 24-hour crisis hotline for victims of domestic violence, safety planning, information and referrals, case management, and support groups for victims of domestic violence.   ACT TOGETHER EMERGENCY SHELTER PROVIDED BY: [facility] [address], [place], [place] Offers a 21-day emergency shelter for youth experiencing a family crisis, abuse, or homelessness. Case management, supportive services, healthcare  services, and more are available for residents. SHELTER PROVIDED BY: [facility] [address], [place], [place] Offers a homeless shelter for people and families. Meals, showers, community referrals, case management, and more are available for residents. HEARTH TRANSITIONAL LIVING PROGRAM PROVIDED BY: [facility] [address], [place], [place] Offers an 19-month homeless shelter for younger adults experiencing homelessness. Case management, independent living skills education, and more are available for residents. PARTNERSHIP VILLAGE PROVIDED BY: [facility] [facility] [address], [place],  Offers transitional housing for families and single people experiencing homelessness. Residents meet regularly with a case manager to work towards self-sufficiency TRANSITIONAL HOUSING PROVIDED BY: [facility] [address], [place], [place] Offers transitional housing for male veterans with disabilities. Residents receive meals, transportation, and clothing. Also offers support groups, nutrition classes, and peer support to residents.   WEAVER HOUSE PROVIDED BY: [facility] [facility] [address], [place], [place] Offers shelter to adult men and women. Guests receive hot meals and case management. Also offers overnight shelter when temperatures drop during cold winter months  EMERGENCY FAMILY SHELTER PROVIDED BY: [facility] [place] [address], ,  Offers shelter and support services for families experiencing homelessness.  Some PCP options in [place] area- not a comprehensive list  [facility]- [phone] [facility]- [phone] [facility]- [phone] [facility]- [phone] [facility]- [phone] Kerney Pee- [phone]  or [facility] Physician Referral Line [phone]

## 2024-03-02 NOTE — Progress Notes (Signed)
 Patient ID: Gary [name], male   DOB: Sep 25, 1986, 38 y.o.   MRN: [id number]     SURGICAL PROGRESS NOTE   Hospital Day(s): 1.   Interval History: Patient seen and examined, no acute events or new complaints overnight. Patient reports continued having upper abdominal pain.  The pain is controlled with the pain medications.  No aggravating factors.  No pain radiation.  Patient endorses he tolerated clear liquid diet.  No nausea this morning.  He had a bowel movement yesterday.  Abdomen x-ray today shows normal gas bowel pattern.  No sign of obstruction.  Vital signs in last 24 hours: [min-max] current  Temp:  [97.6 F (36.4 C)-98.7 F (37.1 C)] 98.3 F (36.8 C) (04/24 0700) Pulse Rate:  [42-62] 53 (04/24 1000) Resp:  [11-20] 14 (04/24 1000) BP: (72-152)/(50-73) 100/70 (04/24 1000) SpO2:  [92 %-100 %] 100 % (04/24 1000) Weight:  [57.6 kg] 57.6 kg (04/24 0522)     Height: 6' (182.9 cm) Weight: 57.6 kg BMI (Calculated): 17.22   Physical Exam:  Constitutional: alert, cooperative and no distress  Respiratory: breathing non-labored at rest  Cardiovascular: regular rate and sinus rhythm  Gastrointestinal: soft, non-tender, and non-distended  Labs:     Latest Ref Rng & Units 03/02/2024    5:49 AM 03/01/2024    1:11 AM 02/28/2024    6:08 AM  CBC  WBC 4.0 - 10.5 K/uL 2.9  5.8  2.6   Hemoglobin 13.0 - 17.0 g/dL 13.2  44.0  9.4   Hematocrit 39.0 - 52.0 % 31.7  40.4  28.1   Platelets 150 - 400 K/uL 250  446  172       Latest Ref Rng & Units 03/02/2024    5:49 AM 03/01/2024    7:50 PM 03/01/2024    1:11 AM  CMP  Glucose 70 - 99 mg/dL 93  92  93   BUN 6 - 20 mg/dL 5  6  10    Creatinine 0.61 - 1.24 mg/dL 1.02  7.25  3.66   Sodium 135 - 145 mmol/L 137  137  136   Potassium 3.5 - 5.1 mmol/L 3.9  4.0  3.7   Chloride 98 - 111 mmol/L 108  112  98   CO2 22 - 32 mmol/L 24  20  23    Calcium  8.9 - 10.3 mg/dL 8.6  8.2  44.0   Total Protein 6.5 - 8.1 g/dL 6.0  5.4  9.4   Total Bilirubin 0.0 -  1.2 mg/dL 0.5  0.5  1.2   Alkaline Phos 38 - 126 U/L 44  41  75   AST 15 - 41 U/L 12  11  22    ALT 0 - 44 U/L 9  10  13      Imaging studies: Abdominal x-ray today without sign of obstruction.  Normal gas pattern.  I personally evaluated the images.   Assessment/Plan:  38 y.o. male with ileus versus obstruction, complicated by pertinent comorbidities including acute recurrent pancreatitis with pseudocyst, alcohol abuse, HIV, schizophrenia.   Ileus versus obstruction -This seems to be resolved. -Patient tolerate clear liquid diet.  Patient having bowel movement -Abdominal x-ray today shows normal gas pattern.  No sign of obstruction or ileus -Advance diet as tolerated.  No surgical intervention needed   Acute pancreatitis with pseudocyst - Currently no sign of infected pancreatitis - If treatment for pseudocyst needs to be addressed, patient will need to be transferred to tertiary center for advanced GI endoscopy evaluation  versus gastrostomy endoscopically - Supportive management  Lucila Rye, MD

## 2024-03-02 NOTE — Progress Notes (Signed)
 eLink Physician-Brief Progress Note Patient Name: Gary [name] DOB: 04/29/1986 MRN: [id number]   Date of Service  03/02/2024  HPI/Events of Note  Patient with chronic ETOH abuse and recurrent acute pancreatitis admitted with sepsis due to pancreatitis and recurrent ETOH abuse.  eICU Interventions  New Patient Evaluation        Marlynn Singer 03/02/2024, 5:36 AM

## 2024-03-02 NOTE — Progress Notes (Signed)
 Sent message to Dr. Lynelle Sara regarding patients BP throughout the night and the start of my shift. BP 71/49. Continue to assess.

## 2024-03-03 DIAGNOSIS — K861 Other chronic pancreatitis: Secondary | ICD-10-CM | POA: Diagnosis not present

## 2024-03-03 DIAGNOSIS — K859 Acute pancreatitis without necrosis or infection, unspecified: Secondary | ICD-10-CM | POA: Diagnosis not present

## 2024-03-03 LAB — CBC WITH DIFFERENTIAL/PLATELET
Abs Immature Granulocytes: 0.01 10*3/uL (ref 0.00–0.07)
Basophils Absolute: 0 10*3/uL (ref 0.0–0.1)
Basophils Relative: 1 %
Eosinophils Absolute: 0.2 10*3/uL (ref 0.0–0.5)
Eosinophils Relative: 5 %
HCT: 30 % — ABNORMAL LOW (ref 39.0–52.0)
Hemoglobin: 10.3 g/dL — ABNORMAL LOW (ref 13.0–17.0)
Immature Granulocytes: 0 %
Lymphocytes Relative: 38 %
Lymphs Abs: 1.5 10*3/uL (ref 0.7–4.0)
MCH: 30.7 pg (ref 26.0–34.0)
MCHC: 34.3 g/dL (ref 30.0–36.0)
MCV: 89.6 fL (ref 80.0–100.0)
Monocytes Absolute: 0.3 10*3/uL (ref 0.1–1.0)
Monocytes Relative: 9 %
Neutro Abs: 1.8 10*3/uL (ref 1.7–7.7)
Neutrophils Relative %: 47 %
Platelets: 263 10*3/uL (ref 150–400)
RBC: 3.35 MIL/uL — ABNORMAL LOW (ref 4.22–5.81)
RDW: 14 % (ref 11.5–15.5)
WBC: 3.8 10*3/uL — ABNORMAL LOW (ref 4.0–10.5)
nRBC: 0 % (ref 0.0–0.2)

## 2024-03-03 LAB — RENAL FUNCTION PANEL
Albumin: 3 g/dL — ABNORMAL LOW (ref 3.5–5.0)
Anion gap: 5 (ref 5–15)
BUN: 15 mg/dL (ref 6–20)
CO2: 25 mmol/L (ref 22–32)
Calcium: 8.7 mg/dL — ABNORMAL LOW (ref 8.9–10.3)
Chloride: 104 mmol/L (ref 98–111)
Creatinine, Ser: 0.79 mg/dL (ref 0.61–1.24)
GFR, Estimated: >60 mL/min (ref >60)
Glucose, Bld: 100 mg/dL — ABNORMAL HIGH (ref 70–99)
Phosphorus: 4.5 mg/dL (ref 2.5–4.6)
Potassium: 3.9 mmol/L (ref 3.5–5.1)
Sodium: 134 mmol/L — ABNORMAL LOW (ref 135–145)

## 2024-03-03 LAB — MAGNESIUM: Magnesium: 1.9 mg/dL (ref 1.7–2.4)

## 2024-03-03 MED ORDER — SODIUM CHLORIDE 0.9 % IV BOLUS
1000.0000 mL | Freq: Once | INTRAVENOUS | Status: AC
  Administered 2024-03-03: 1000 mL via INTRAVENOUS

## 2024-03-03 MED ORDER — OXYCODONE-ACETAMINOPHEN 5-325 MG PO TABS
1.0000 | ORAL_TABLET | ORAL | Status: DC | PRN
  Administered 2024-03-03 – 2024-03-04 (×3): 1 via ORAL
  Filled 2024-03-03 (×4): qty 1

## 2024-03-03 MED ORDER — OXYCODONE-ACETAMINOPHEN 5-325 MG PO TABS: 1.0000 | ORAL_TABLET | Freq: Once | ORAL | Status: DC

## 2024-03-03 MED ORDER — HYDROMORPHONE HCL 1 MG/ML IJ SOLN
0.5000 mg | Freq: Once | INTRAMUSCULAR | Status: AC
  Administered 2024-03-03: 0.5 mg via INTRAVENOUS
  Filled 2024-03-03: qty 1

## 2024-03-03 NOTE — Progress Notes (Signed)
 PHARMACY CONSULT NOTE - FOLLOW UP  Pharmacy Consult for Electrolyte Monitoring and Replacement   Recent Labs: Potassium (mmol/L)  Date Value  03/03/2024 3.9  11/15/2014 4.1   Magnesium  (mg/dL)  Date Value  57/84/6962 1.9   Calcium  (mg/dL)  Date Value  95/28/4132 8.7 (L)   Calcium , Total (mg/dL)  Date Value  44/11/270 9.4   Albumin (g/dL)  Date Value  53/66/4403 3.0 (L)  11/15/2014 4.7   Phosphorus (mg/dL)  Date Value  47/42/5956 4.5   Sodium (mmol/L)  Date Value  03/03/2024 134 (L)  11/15/2014 138    Assessment: 38 year old male with history of HIV, alcohol dependence, schizophrenia, chronic pancreatitis, presents emergency department for chief concerns of tremors and concerns of alcohol withdrawal. Pharmacy is asked to follow and replace electrolytes  Goal of Therapy:  Electrolytes WNL  Plan:  ---no electrolyte replacement warranted for today ---recheck electrolytes in am  Adalberto Acton ,PharmD Clinical Pharmacist 03/03/2024 7:08 AM

## 2024-03-03 NOTE — Progress Notes (Signed)
 Patient's B/P is 89/59 (69) and is currently requesting pain medication.  Provider notified and order received to hold pain medication at this time and administer 1L NS bolus.

## 2024-03-03 NOTE — Plan of Care (Signed)
  Problem: Education: Goal: Knowledge of General Education information will improve Description: Including pain rating scale, medication(s)/side effects and non-pharmacologic comfort measures Outcome: Progressing   Problem: Health Behavior/Discharge Planning: Goal: Ability to manage health-related needs will improve Outcome: Progressing   Problem: Clinical Measurements: Goal: Will remain free from infection Outcome: Progressing Goal: Diagnostic test results will improve Outcome: Progressing Goal: Respiratory complications will improve Outcome: Progressing Goal: Cardiovascular complication will be avoided Outcome: Progressing   Problem: Activity: Goal: Risk for activity intolerance will decrease Outcome: Progressing   Problem: Nutrition: Goal: Adequate nutrition will be maintained Outcome: Progressing   Problem: Coping: Goal: Level of anxiety will decrease Outcome: Progressing   Problem: Elimination: Goal: Will not experience complications related to bowel motility Outcome: Progressing   Problem: Pain Managment: Goal: General experience of comfort will improve and/or be controlled Outcome: Progressing   Problem: Safety: Goal: Ability to remain free from injury will improve Outcome: Progressing   Problem: Skin Integrity: Goal: Risk for impaired skin integrity will decrease Outcome: Progressing

## 2024-03-03 NOTE — Progress Notes (Signed)
 CRITICAL CARE PROGRESS NOTE    Name: Gary [name] MRN: [id number] DOB: 12-13-1985     LOS: 2   SUBJECTIVE FINDINGS & SIGNIFICANT EVENTS    History of Presenting Illness:  38 yo with PMH of HIV, alcohol dependence, schizophrenia, chronic pancreatitis, frequent admissions to the hospital presenting w/ acute on chronic pancreatitis, SBO.  Patient noted to have multiple recurrent admissions for ETOH withdrawal. He complains of abdominal pain and symptoms of withdrawal. He was found to be in circulatory shock requiring pressor support and PCCM has been consulted for shock.   03/02/24- abdominal pain is improved this am, he remains on midodrine  10 TID and IVF.  Pharmacy consultant for electrolyte derrangement.  RD on case plan for post pyloric NGT. Palliative care consultation due to very poor prognosis.   03/03/24- patient is able to eat well today without abd pain.  He is stable and clinically is no longer in shock on PO midodrine . He remains on antibiotics and is no longer having diarreah on pancreatic replacement enzyme. PCCM will sign off today but we are available if needed.   Lines/tubes   Microbiology/Sepsis markers: Results for orders placed or performed during the hospital encounter of 03/01/24  Blood Culture (routine x 2)     Status: None (Preliminary result)   Collection Time: 03/01/24  1:11 AM   Specimen: BLOOD  Result Value Ref Range Status   Specimen Description BLOOD LA  Final   Special Requests   Final    BOTTLES DRAWN AEROBIC AND ANAEROBIC Blood Culture results may not be optimal due to an inadequate volume of blood received in culture bottles   Culture   Final    NO GROWTH 2 DAYS Performed at [facility], [address], [place], [place] [address]    Report Status PENDING  Incomplete   Blood Culture (routine x 2)     Status: None (Preliminary result)   Collection Time: 03/01/24  2:08 AM   Specimen: BLOOD  Result Value Ref Range Status   Specimen Description BLOOD LA  Final   Special Requests   Final    BOTTLES DRAWN AEROBIC AND ANAEROBIC Blood Culture results may not be optimal due to an inadequate volume of blood received in culture bottles   Culture   Final    NO GROWTH 2 DAYS Performed at [facility], [address] [place], [place] [address]    Report Status PENDING  Incomplete  Resp panel by RT-PCR (RSV, Flu A&B, Covid) Anterior Nasal Swab     Status: None   Collection Time: 03/01/24  2:19 AM   Specimen: Anterior Nasal Swab  Result Value Ref Range Status   SARS Coronavirus 2 by RT PCR NEGATIVE NEGATIVE Final    Comment: (NOTE) SARS-CoV-2 target nucleic acids are NOT DETECTED.  The SARS-CoV-2 RNA is generally detectable in upper respiratory specimens during the acute phase of infection. The lowest concentration of SARS-CoV-2 viral copies this assay can detect is 138 copies/mL. A negative result does not preclude SARS-Cov-2 infection and should not be used as the sole basis for treatment or other patient management decisions. A negative result may occur with  improper specimen collection/handling, submission of specimen other than nasopharyngeal swab, presence of viral mutation(s) within the areas targeted by this assay, and inadequate number of viral copies(<138 copies/mL). A negative result must be combined with clinical observations, patient history, and epidemiological information. The expected result is Negative.  Fact Sheet for Patients:  BloggerCourse.com  Fact Sheet for Healthcare Providers:  SeriousBroker.it  This test is no t yet approved or cleared by the [place]  FDA and  has been authorized for detection and/or diagnosis of SARS-CoV-2 by FDA under an Emergency Use  Authorization (EUA). This EUA will remain  in effect (meaning this test can be used) for the duration of the COVID-19 declaration under Section 564(b)(1) of the Act, 21 U.S.C.section 360bbb-3(b)(1), unless the authorization is terminated  or revoked sooner.       Influenza A by PCR NEGATIVE NEGATIVE Final   Influenza B by PCR NEGATIVE NEGATIVE Final    Comment: (NOTE) The Xpert Xpress SARS-CoV-2/FLU/RSV plus assay is intended as an aid in the diagnosis of influenza from Nasopharyngeal swab specimens and should not be used as a sole basis for treatment. Nasal washings and aspirates are unacceptable for Xpert Xpress SARS-CoV-2/FLU/RSV testing.  Fact Sheet for Patients: BloggerCourse.com  Fact Sheet for Healthcare Providers: SeriousBroker.it  This test is not yet approved or cleared by the [place]  FDA and has been authorized for detection and/or diagnosis of SARS-CoV-2 by FDA under an Emergency Use Authorization (EUA). This EUA will remain in effect (meaning this test can be used) for the duration of the COVID-19 declaration under Section 564(b)(1) of the Act, 21 U.S.C. section 360bbb-3(b)(1), unless the authorization is terminated or revoked.     Resp Syncytial Virus by PCR NEGATIVE NEGATIVE Final    Comment: (NOTE) Fact Sheet for Patients: BloggerCourse.com  Fact Sheet for Healthcare Providers: SeriousBroker.it  This test is not yet approved or cleared by the [place]  FDA and has been authorized for detection and/or diagnosis of SARS-CoV-2 by FDA under an Emergency Use Authorization (EUA). This EUA will remain in effect (meaning this test can be used) for the duration of the COVID-19 declaration under Section 564(b)(1) of the Act, 21 U.S.C. section 360bbb-3(b)(1), unless the authorization is terminated or revoked.  Performed at [facility], [address] [place], [place] [address]   C Difficile Quick Screen w PCR reflex     Status: None   Collection Time: 03/01/24  8:12 AM   Specimen: STOOL  Result Value Ref Range Status   C Diff antigen NEGATIVE NEGATIVE Final   C Diff toxin NEGATIVE NEGATIVE Final   C Diff interpretation No C. difficile detected.  Final    Comment: Performed at [facility], [address] [place], [place] [address]  Gastrointestinal Panel by PCR , Stool     Status: None   Collection Time: 03/01/24  8:12 AM   Specimen: STOOL  Result Value Ref Range Status   Campylobacter species NOT DETECTED NOT DETECTED Final   Plesimonas shigelloides NOT DETECTED NOT DETECTED Final   Salmonella species NOT DETECTED NOT DETECTED Final   Yersinia enterocolitica NOT DETECTED NOT DETECTED Final   Vibrio species NOT DETECTED NOT DETECTED Final   Vibrio cholerae NOT DETECTED NOT DETECTED Final   Enteroaggregative E coli (EAEC) NOT DETECTED NOT DETECTED Final   Enteropathogenic E coli (EPEC) NOT DETECTED NOT DETECTED Final   Enterotoxigenic E coli (ETEC) NOT DETECTED NOT DETECTED Final   Shiga like toxin producing E coli (STEC) NOT DETECTED NOT DETECTED Final   Shigella/Enteroinvasive E coli (EIEC) NOT DETECTED NOT DETECTED Final   Cryptosporidium NOT DETECTED NOT DETECTED Final   Cyclospora cayetanensis NOT DETECTED NOT DETECTED Final   Entamoeba histolytica NOT DETECTED NOT DETECTED Final   Giardia lamblia NOT DETECTED NOT DETECTED Final   Adenovirus F40/41 NOT  DETECTED NOT DETECTED Final   Astrovirus NOT DETECTED NOT DETECTED Final   Norovirus GI/GII NOT DETECTED NOT DETECTED Final   Rotavirus A NOT DETECTED NOT DETECTED Final   Sapovirus (I, II, IV, and V) NOT DETECTED NOT DETECTED Final    Comment: Performed at [facility], [address] [place], [place] [address]  MRSA Next Gen by PCR, Nasal     Status: None   Collection Time: 03/02/24  5:32 AM   Specimen: Nasal Mucosa; Nasal Swab  Result Value  Ref Range Status   MRSA by PCR Next Gen NOT DETECTED NOT DETECTED Final    Comment: (NOTE) The GeneXpert MRSA Assay (FDA approved for NASAL specimens only), is one component of a comprehensive MRSA colonization surveillance program. It is not intended to diagnose MRSA infection nor to guide or monitor treatment for MRSA infections. Test performance is not FDA approved in patients less than 10 years old. Performed at [facility], [address], [place], [place] [address]     Anti-infectives:  Anti-infectives (From admission, onward)    Start     Dose/Rate Route Frequency Ordered Stop   03/02/24 1100  bictegravir-emtricitabine -tenofovir  AF (BIKTARVY ) 50-200-25 MG per tablet 1 tablet        1 tablet Oral Daily 03/02/24 0948     03/01/24 1300  metroNIDAZOLE  (FLAGYL ) IVPB 500 mg        500 mg 100 mL/hr over 60 Minutes Intravenous Every 12 hours 03/01/24 0742     03/01/24 1000  cefTRIAXone  (ROCEPHIN ) 2 g in sodium chloride  0.9 % 100 mL IVPB        2 g 200 mL/hr over 30 Minutes Intravenous Every 24 hours 03/01/24 0742     03/01/24 0045  ceFEPIme  (MAXIPIME ) 2 g in sodium chloride  0.9 % 100 mL IVPB        2 g 200 mL/hr over 30 Minutes Intravenous  Once 03/01/24 0037 03/01/24 0327   03/01/24 0045  metroNIDAZOLE  (FLAGYL ) IVPB 500 mg        500 mg 100 mL/hr over 60 Minutes Intravenous  Once 03/01/24 0037 03/01/24 0441   03/01/24 0045  vancomycin  (VANCOCIN ) IVPB 1000 mg/200 mL premix        1,000 mg 200 mL/hr over 60 Minutes Intravenous  Once 03/01/24 0037 03/01/24 0600        PAST MEDICAL HISTORY   Past Medical History:  Diagnosis Date   Alcohol abuse    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   Anxiety    Bipolar 2 disorder (HCC)    HIV (human immunodeficiency virus infection) (HCC)    Pancreatitis    PTSD (post-traumatic stress disorder)    Schizophrenia (HCC)    Seizures (HCC)    Subdural hematoma (HCC)      SURGICAL HISTORY   Past Surgical History:   Procedure Laterality Date   BIOPSY  04/19/2022   Procedure: BIOPSY;  Surgeon: Normie Becton., MD;  Location: [facility] ENDOSCOPY;  Service: Gastroenterology;;   ENTEROSCOPY N/A 04/19/2022   Procedure: ENTEROSCOPY;  Surgeon: Normie Becton., MD;  Location: [facility] ENDOSCOPY;  Service: Gastroenterology;  Laterality: N/A;   INCISION AND DRAINAGE PERIRECTAL ABSCESS N/A 09/24/2016   Procedure: IRRIGATION AND DEBRIDEMENT PERIRECTAL ABSCESS;  Surgeon: Gwyndolyn Lerner, MD;  Location: [facility] ORS;  Service: General;  Laterality: N/A;   none       FAMILY HISTORY   Family History  Problem Relation Age of Onset   Hypertension Mother    Alcohol abuse Mother  Alcoholism Mother    Alcohol abuse Father    Colon cancer Other    Other Other    Cancer Other      SOCIAL HISTORY   Social History   Tobacco Use   Smoking status: Every Day    Current packs/day: 0.50    Average packs/day: 0.5 packs/day for 21.3 years (10.7 ttl pk-yrs)    Types: Cigarettes    Start date: 2004   Smokeless tobacco: Never   Tobacco comments:    unable to smoke while incarcerated 6+ months 02/13/20  Vaping Use   Vaping status: Never Used  Substance Use Topics   Alcohol use: Yes    Alcohol/week: 105.0 standard drinks of alcohol    Types: 105 Cans of beer per week    Comment: drinks every day, all day, "whatever I can get my hands on". "Half a gallon of vodka" 12/16   Drug use: Not Currently    Types: Methamphetamines, Cocaine    Comment: last used 10/22/2023     MEDICATIONS   Current Medication:  Current Facility-Administered Medications:    bictegravir-emtricitabine -tenofovir  AF (BIKTARVY ) 50-200-25 MG per tablet 1 tablet, 1 tablet, Oral, Daily, Adalberto Acton, RPH, 1 tablet at 03/03/24 1610   cefTRIAXone  (ROCEPHIN ) 2 g in sodium chloride  0.9 % 100 mL IVPB, 2 g, Intravenous, Q24H, Corrinne Din, MD, Last Rate: 200 mL/hr at 03/03/24 1005, 2 g at 03/03/24 1005   chlordiazePOXIDE  (LIBRIUM ) capsule 10  mg, 10 mg, Oral, QID, Richell Corker, MD, 10 mg at 03/03/24 1003   Chlorhexidine  Gluconate Cloth 2 % PADS 6 each, 6 each, Topical, Daily, Corrinne Din, MD, 6 each at 03/03/24 1003   enoxaparin  (LOVENOX ) injection 40 mg, 40 mg, Subcutaneous, Q24H, Corrinne Din, MD, 40 mg at 03/03/24 0827   folic acid  (FOLVITE ) tablet 1 mg, 1 mg, Oral, Daily, Corrinne Din, MD, 1 mg at 03/03/24 9604   HYDROmorphone  (DILAUDID ) injection 0.5 mg, 0.5 mg, Intravenous, Q4H PRN, Corrinne Din, MD, 0.5 mg at 03/03/24 1010   lipase/protease/amylase (CREON ) capsule 24,000 Units, 24,000 Units, Oral, TID AC, Kevron Patella, MD, 24,000 Units at 03/03/24 1003   metroNIDAZOLE  (FLAGYL ) IVPB 500 mg, 500 mg, Intravenous, Q12H, Corrinne Din, MD, Last Rate: 100 mL/hr at 03/03/24 0015, 500 mg at 03/03/24 0015   midodrine  (PROAMATINE ) tablet 10 mg, 10 mg, Oral, TID, Corrinne Din, MD, 10 mg at 03/03/24 5409   multivitamin with minerals tablet 1 tablet, 1 tablet, Oral, Daily, Corrinne Din, MD, 1 tablet at 03/03/24 8119   nicotine  (NICODERM CQ  - dosed in mg/24 hours) patch 14 mg, 14 mg, Transdermal, Daily, Corrinne Din, MD, 14 mg at 03/02/24 1478   ondansetron  (ZOFRAN ) tablet 4 mg, 4 mg, Oral, Q6H PRN **OR** ondansetron  (ZOFRAN ) injection 4 mg, 4 mg, Intravenous, Q6H PRN, Corrinne Din, MD   pantoprazole  (PROTONIX ) injection 40 mg, 40 mg, Intravenous, Q12H, Corrinne Din, MD, 40 mg at 03/03/24 0827   promethazine  (PHENERGAN ) 12.5 mg in sodium chloride  0.9 % 50 mL IVPB, 12.5 mg, Intravenous, Q6H PRN, Lanetta Pion, MD   protein supplement (ENSURE MAX) liquid, 11 oz, Oral, TID, Tanashia Ciesla, MD, 11 oz at 03/02/24 2109   thiamine  (VITAMIN B1) tablet 100 mg, 100 mg, Oral, Daily, 100 mg at 03/02/24 1005 **OR** thiamine  (VITAMIN B1) injection 100 mg, 100 mg, Intravenous, Daily, Corrinne Din, MD, 100 mg at 03/03/24 0827    ALLERGIES   Tegretol  [carbamazepine ] and Caffeine  REVIEW OF SYSTEMS     10 point ROS done and is negative except as per subjective findings  PHYSICAL EXAMINATION   Vital Signs: Temp:  [98.4 F (36.9 C)-98.8 F (37.1 C)] 98.4 F (36.9 C) (04/25 0800) Pulse Rate:  [39-125] 50 (04/25 0945) Resp:  [12-22] 12 (04/25 0945) BP: (69-114)/(38-82) 108/71 (04/25 0945) SpO2:  [94 %-100 %] 100 % (04/25 0945) Weight:  [59.7 kg] 59.7 kg (04/25 0500)  GENERAL:Appears older then stated age in mild distress due to abd pain HEAD: Normocephalic, atraumatic.  EYES: Pupils equal, round, reactive to light.  No scleral icterus.  MOUTH: Moist mucosal membrane. NECK: Supple. No thyromegaly. No nodules. No JVD.  PULMONARY: decreased air entry bilaterally  CARDIOVASCULAR: S1 and S2. Regular rate and rhythm. No murmurs, rubs, or gallops.  GASTROINTESTINAL: Soft, nontender, non-distended. No masses. Positive bowel sounds. No hepatosplenomegaly.  MUSCULOSKELETAL: No swelling, clubbing, or edema.  NEUROLOGIC: Mild distress due to acute illness SKIN:intact,warm,dry   PERTINENT DATA     Infusions:  cefTRIAXone  (ROCEPHIN )  IV 2 g (03/03/24 1005)   metronidazole  500 mg (03/03/24 0015)   promethazine  (PHENERGAN ) injection (IM or IVPB)     Scheduled Medications:  bictegravir-emtricitabine -tenofovir  AF  1 tablet Oral Daily   chlordiazePOXIDE   10 mg Oral QID   Chlorhexidine  Gluconate Cloth  6 each Topical Daily   enoxaparin  (LOVENOX ) injection  40 mg Subcutaneous Q24H   folic acid   1 mg Oral Daily   lipase/protease/amylase  24,000 Units Oral TID AC   midodrine   10 mg Oral TID   multivitamin with minerals  1 tablet Oral Daily   nicotine   14 mg Transdermal Daily   pantoprazole  (PROTONIX ) IV  40 mg Intravenous Q12H   Ensure Max Protein  11 oz Oral TID   thiamine   100 mg Oral Daily   Or   thiamine   100 mg Intravenous Daily   PRN Medications: HYDROmorphone  (DILAUDID ) injection, ondansetron  **OR** ondansetron  (ZOFRAN ) IV, promethazine  (PHENERGAN ) injection (IM or  IVPB) Hemodynamic parameters:   Intake/Output: 04/24 0701 - 04/25 0700 In: 1869 [P.O.:570; IV Piggyback:1299] Out: 4100 [Urine:4100]  Ventilator  Settings:     LAB RESULTS:  Basic Metabolic Panel: Recent Labs  Lab 02/27/24 0338 02/28/24 0608 03/01/24 0111 03/01/24 1950 03/02/24 0549 03/03/24 0257  NA 137 139 136 137 137 134*  K 4.1 4.0 3.7 4.0 3.9 3.9  CL 107 109 98 112* 108 104  CO2 25 25 23  20* 24 25  GLUCOSE 89 99 93 92 93 100*  BUN 8 6 10 6  <5* 15  CREATININE 0.71 0.66 0.90 0.61 0.79 0.79  CALCIUM  9.0 8.5* 10.5* 8.2* 8.6* 8.7*  MG 1.9 1.9  --  1.6* 2.2 1.9  PHOS 3.4 3.3  --  3.6 4.0 4.5   Liver Function Tests: Recent Labs  Lab 02/27/24 0338 02/28/24 0608 03/01/24 0111 03/01/24 1950 03/02/24 0549 03/03/24 0257  AST 12* 15 22 11* 12*  --   ALT 11 10 13 10 9   --   ALKPHOS 44 40 75 41 44  --   BILITOT 0.5 0.3 1.2 0.5 0.5  --   PROT 6.1* 5.6* 9.4* 5.4* 6.0*  --   ALBUMIN 3.2* 2.8* 5.2* 2.8* 3.1* 3.0*   Recent Labs  Lab 02/25/24 1148 03/01/24 0111  LIPASE 33 35   No results for input(s): "AMMONIA" in the last 168 hours. CBC: Recent Labs  Lab 02/25/24 1124 02/26/24 0520 02/27/24 0338 02/28/24 0608 03/01/24 0111 03/02/24 0549 03/03/24 0257  WBC  3.8*   < > 2.2* 2.6* 5.8 2.9* 3.8*  NEUTROABS 2.4  --  1.0* 1.2* 3.1  --  1.8  HGB 10.5*   < > 10.4* 9.4* 13.7 10.9* 10.3*  HCT 33.0*   < > 31.4* 28.1* 40.4 31.7* 30.0*  MCV 97.9   < > 92.9 90.9 90.8 89.5 89.6  PLT 210   < > 163 172 446* 250 263   < > = values in this interval not displayed.   Cardiac Enzymes: No results for input(s): "CKTOTAL", "CKMB", "CKMBINDEX", "TROPONINI" in the last 168 hours. BNP: Invalid input(s): "POCBNP" CBG: Recent Labs  Lab 03/02/24 0517  GLUCAP 74      ASSESSMENT AND PLAN    -Multidisciplinary rounds held today  Circulatory shock- RESOLVED    -present on admission due to anorexia and GI losses from chronic diarreah     - patient with alcoholism and chronic  pancreatitis    - his baseline is with BMI 18 and hypotension may be new normal    - continue midodrine  for now 5-10mg  PO TID -  s/p 3L IV fluids   Chronic pancreatitis   - initiate pancreatic replacement enzyme -monitor for refeeding syndrome  - anaglesia therapy  - RD - post pyloric NGT    Chronic alcoholism    -folate and thiamine     - no signs of withdrawal at this moment     - librium  10mg  QID   HIV infection  - continue home BIktravy   ID -continue IV abx as prescibed -follow up cultures  GI/Nutrition GI PROPHYLAXIS as indicated DIET-->TF's as tolerated Constipation protocol as indicated  ENDO - ICU hypoglycemic\Hyperglycemia protocol -check FSBS per protocol   ELECTROLYTES -follow labs as needed -replace as needed -pharmacy consultation   DVT/GI PRX ordered -SCDs  TRANSFUSIONS AS NEEDED MONITOR FSBS ASSESS the need for LABS as needed    Critical care provider statement:   Total critical care time: 33 minutes   Performed by: Jaclynn Mast MD   Critical care time was exclusive of separately billable procedures and treating other patients.   Critical care was necessary to treat or prevent imminent or life-threatening deterioration.   Critical care was time spent personally by me on the following activities: development of treatment plan with patient and/or surrogate as well as nursing, discussions with consultants, evaluation of patient's response to treatment, examination of patient, obtaining history from patient or surrogate, ordering and performing treatments and interventions, ordering and review of laboratory studies, ordering and review of radiographic studies, pulse oximetry and re-evaluation of patient's condition.    Thom Ollinger, M.D.  Pulmonary & Critical Care Medicine

## 2024-03-03 NOTE — Progress Notes (Signed)
 Patient requesting pain medication. Patient unable to receive IV PRN dilaudid  due to SBP less than 105 per order. MD notified, and 5 mg percocet ordered, patient states "that's not going to do much" "that's not going to help me now" and refused medication. MD notified.

## 2024-03-03 NOTE — Progress Notes (Signed)
 Progress Note   Patient: Gary [name]:[id number] DOB: [birth year] DOA: 03/01/2024     2 DOS: the patient was seen and examined on 03/03/2024     Brief hospital course: From HPI "Gary [name] is a 38 y.o. male with medical history significant of HIV, alcohol dependence, schizophrenia, chronic pancreatitis, frequent admissions to the hospital presenting w/ acute on chronic pancreatitis, SBO.  Patient noted to have multiple recurrent admissions for similar issues roughly 3+ admissions in April thus far.  This will be admission #4.  Patient reports generalized abdominal pain with past 4 to 5 days.  Last alcohol intake was roughly 4 days ago with 1 bottle of wine.  Denies any illicit drug use.  No chest pain or shortness of breath.  Mild nausea.  Denies any SI/HI.  No fevers or chills.  Mild diarrhea.  Symptoms similar to previous episodes of pancreatitis.  Denies any illicit drug use. Presented to the ER afebrile, hemodynamically stable.  Satting well on room air.  White count 6, hemoglobin 13.7, platelets 446.  White count 2.4-1.8.  COVID flu and RSV negative.  Alcohol level within normal limits.  Creatinine 0.9.  CT with acute pancreatitis with concern for interval complex pancreatic pseudocyst formation-recommending MRI.  Also with small bowel obstruction on imaging.  Diffuse urinary bladder wall thickening concerning for possible cystitis.  "   Assessment and Plan: Acute on chronic pancreatitis (HCC) Recurrent episodes of pancreatitis in setting of heavy ETOH use  CT imaging consistent with acute pancreatitis and new complex pancreatic pseudocyst Patient has been seen by surgeon and no surgery being planned given no findings of obstruction on repeat imaging Continue as needed pain medication Continue IV fluid MRI showing findings of acute pancreatitis WBC grossly stable  I appreciate input by ICU team We will consider discontinuation of antibiotics if no evidence of infection found        Alcohol dependence with withdrawal (HCC) Alcohol level within normal limits Noted history of maladaptive behavior patterns concerning for narcotic and sedating medication abuse Continue CIWA protocol Minimize anxiolytic use only as appropriate given history Monitor   HIV disease (HCC) Cont home biktarvy      Ileus (HCC) Noted partial small bowel obstruction versus ileus on imaging.  General Surgery on board No surgery being planned given no concerns of obstruction     Polysubstance abuse (HCC) Urine drug screen pending      Nicotine  dependence, cigarettes, uncomplicated Nicotine  patch      High risk for readmission Malingering Baseline history of Frequent admissions secondary to pancreatitis, alcohol withdrawal, and pain seeking behaviors.  Last admission 4/18-4/21 Noted concurrent acute on chronic pancreatitis with pancreatic pseudocyst Minimize narcotics as appropriate Follow closely   Gastritis and duodenitis IV PPI   Severe episode of recurrent major depressive disorder, without psychotic features (HCC) Mood stable  No HI/SI  Cont seroquel  once po status improves     Advance Care Planning:   Code Status: Full Code    Consults: None    Family Communication: No family at the bedside    Subjective:  Patient seen and examined at bedside this morning Patient denies chest pain nausea vomiting abdominal pain   Physical Exam: HENT:     Head: Normocephalic and atraumatic.     Nose: Nose normal.     Mouth/Throat:     Mouth: Mucous membranes are dry.  Eyes:     Pupils: Pupils are equal, round, and reactive to light.  Cardiovascular:  Rate and Rhythm: Normal rate and regular rhythm.  Pulmonary:     Effort: Pulmonary effort is normal.  Abdominal:     General: Bowel sounds are normal.     Comments: Positive mild generalized abdominal pain.  Musculoskeletal:        General: Normal range of motion.  Skin:    General: Skin is dry.  Neurological:      General: No focal deficit present.  Psychiatric:        Mood and Affect: Mood normal.    Data Reviewed: Abdominal imaging reviewed with findings as noted above   Vitals:   03/03/24 1425 03/03/24 1500 03/03/24 1600 03/03/24 1700  BP: 93/62 93/60 (!) 94/59 106/66  Pulse:  (!) 56 (!) 52 (!) 48  Resp:  19 19 20   Temp:   98.3 F (36.8 C)   TempSrc:   Axillary   SpO2:  99% 98% 99%  Weight:      Height:          Latest Ref Rng & Units 03/03/2024    2:57 AM 03/02/2024    5:49 AM 03/01/2024    1:11 AM  CBC  WBC 4.0 - 10.5 K/uL 3.8  2.9  5.8   Hemoglobin 13.0 - 17.0 g/dL 78.4  69.6  29.5   Hematocrit 39.0 - 52.0 % 30.0  31.7  40.4   Platelets 150 - 400 K/uL 263  250  446        Latest Ref Rng & Units 03/03/2024    2:57 AM 03/02/2024    5:49 AM 03/01/2024    7:50 PM  BMP  Glucose 70 - 99 mg/dL 284  93  92   BUN 6 - 20 mg/dL 15  <5  6   Creatinine 0.61 - 1.24 mg/dL 1.32  4.40  1.02   Sodium 135 - 145 mmol/L 134  137  137   Potassium 3.5 - 5.1 mmol/L 3.9  3.9  4.0   Chloride 98 - 111 mmol/L 104  108  112   CO2 22 - 32 mmol/L 25  24  20    Calcium  8.9 - 10.3 mg/dL 8.7  8.6  8.2      Author: Ezzard Holms, MD 03/03/2024 5:16 PM  For on call review [email or web address].

## 2024-03-03 NOTE — Plan of Care (Signed)
  Problem: Education: Goal: Knowledge of General Education information will improve Description: Including pain rating scale, medication(s)/side effects and non-pharmacologic comfort measures Outcome: Progressing   Problem: Clinical Measurements: Goal: Ability to maintain clinical measurements within normal limits will improve Outcome: Progressing Goal: Respiratory complications will improve Outcome: Progressing Goal: Cardiovascular complication will be avoided Outcome: Progressing   Problem: Activity: Goal: Risk for activity intolerance will decrease Outcome: Progressing   Problem: Nutrition: Goal: Adequate nutrition will be maintained Outcome: Progressing   Problem: Elimination: Goal: Will not experience complications related to bowel motility Outcome: Progressing   Problem: Pain Managment: Goal: General experience of comfort will improve and/or be controlled Outcome: Progressing

## 2024-03-04 DIAGNOSIS — K859 Acute pancreatitis without necrosis or infection, unspecified: Secondary | ICD-10-CM | POA: Diagnosis not present

## 2024-03-04 DIAGNOSIS — K861 Other chronic pancreatitis: Secondary | ICD-10-CM | POA: Diagnosis not present

## 2024-03-04 LAB — RENAL FUNCTION PANEL
Albumin: 2.9 g/dL — ABNORMAL LOW (ref 3.5–5.0)
Anion gap: 3 — ABNORMAL LOW (ref 5–15)
BUN: 23 mg/dL — ABNORMAL HIGH (ref 6–20)
CO2: 25 mmol/L (ref 22–32)
Calcium: 8.3 mg/dL — ABNORMAL LOW (ref 8.9–10.3)
Chloride: 104 mmol/L (ref 98–111)
Creatinine, Ser: 0.64 mg/dL (ref 0.61–1.24)
GFR, Estimated: >60 mL/min (ref >60)
Glucose, Bld: 113 mg/dL — ABNORMAL HIGH (ref 70–99)
Phosphorus: 4.3 mg/dL (ref 2.5–4.6)
Potassium: 4.1 mmol/L (ref 3.5–5.1)
Sodium: 132 mmol/L — ABNORMAL LOW (ref 135–145)

## 2024-03-04 LAB — CBC WITH DIFFERENTIAL/PLATELET
Abs Immature Granulocytes: 0.01 10*3/uL (ref 0.00–0.07)
Basophils Absolute: 0 10*3/uL (ref 0.0–0.1)
Basophils Relative: 1 %
Eosinophils Absolute: 0.1 10*3/uL (ref 0.0–0.5)
Eosinophils Relative: 4 %
HCT: 29.2 % — ABNORMAL LOW (ref 39.0–52.0)
Hemoglobin: 10.3 g/dL — ABNORMAL LOW (ref 13.0–17.0)
Immature Granulocytes: 0 %
Lymphocytes Relative: 40 %
Lymphs Abs: 1.5 10*3/uL (ref 0.7–4.0)
MCH: 31.3 pg (ref 26.0–34.0)
MCHC: 35.3 g/dL (ref 30.0–36.0)
MCV: 88.8 fL (ref 80.0–100.0)
Monocytes Absolute: 0.3 10*3/uL (ref 0.1–1.0)
Monocytes Relative: 8 %
Neutro Abs: 1.9 10*3/uL (ref 1.7–7.7)
Neutrophils Relative %: 47 %
Platelets: 243 10*3/uL (ref 150–400)
RBC: 3.29 MIL/uL — ABNORMAL LOW (ref 4.22–5.81)
RDW: 13.9 % (ref 11.5–15.5)
WBC: 3.9 10*3/uL — ABNORMAL LOW (ref 4.0–10.5)
nRBC: 0 % (ref 0.0–0.2)

## 2024-03-04 LAB — MAGNESIUM: Magnesium: 1.9 mg/dL (ref 1.7–2.4)

## 2024-03-04 MED ORDER — PANTOPRAZOLE SODIUM 40 MG PO TBEC
40.0000 mg | DELAYED_RELEASE_TABLET | Freq: Two times a day (BID) | ORAL | Status: DC
  Administered 2024-03-04 – 2024-03-05 (×3): 40 mg via ORAL
  Filled 2024-03-04 (×3): qty 1

## 2024-03-04 MED ORDER — CHLORDIAZEPOXIDE HCL 5 MG PO CAPS
10.0000 mg | ORAL_CAPSULE | Freq: Three times a day (TID) | ORAL | Status: DC
  Administered 2024-03-04 – 2024-03-05 (×5): 10 mg via ORAL
  Filled 2024-03-04 (×5): qty 2

## 2024-03-04 MED ORDER — HYDROMORPHONE HCL 1 MG/ML IJ SOLN
0.5000 mg | Freq: Once | INTRAMUSCULAR | Status: AC
  Administered 2024-03-04: 0.5 mg via INTRAVENOUS
  Filled 2024-03-04: qty 0.5

## 2024-03-04 MED ORDER — GABAPENTIN 300 MG PO CAPS
600.0000 mg | ORAL_CAPSULE | Freq: Three times a day (TID) | ORAL | Status: DC
Start: 2024-03-04 — End: 2024-03-05
  Administered 2024-03-04 – 2024-03-05 (×5): 600 mg via ORAL
  Filled 2024-03-04 (×5): qty 2

## 2024-03-04 MED ORDER — QUETIAPINE FUMARATE 25 MG PO TABS
50.0000 mg | ORAL_TABLET | Freq: Two times a day (BID) | ORAL | Status: DC
  Administered 2024-03-04 – 2024-03-05 (×2): 50 mg via ORAL
  Filled 2024-03-04 (×2): qty 2

## 2024-03-04 NOTE — Plan of Care (Signed)
  Problem: Clinical Measurements: Goal: Ability to maintain clinical measurements within normal limits will improve Outcome: Progressing Goal: Will remain free from infection Outcome: Progressing Goal: Respiratory complications will improve Outcome: Progressing Goal: Cardiovascular complication will be avoided Outcome: Progressing   Problem: Activity: Goal: Risk for activity intolerance will decrease Outcome: Progressing   Problem: Nutrition: Goal: Adequate nutrition will be maintained Outcome: Progressing   Problem: Coping: Goal: Level of anxiety will decrease Outcome: Progressing   Problem: Elimination: Goal: Will not experience complications related to urinary retention Outcome: Progressing   Problem: Safety: Goal: Ability to remain free from injury will improve Outcome: Progressing   Problem: Skin Integrity: Goal: Risk for impaired skin integrity will decrease Outcome: Progressing

## 2024-03-04 NOTE — Progress Notes (Signed)
 1505 Report called to [facility] on 1 A.1 1530 Patient transferred to room 151 A via wheelchair.Aaron Aas

## 2024-03-04 NOTE — Progress Notes (Signed)
 Progress Note   Patient: Gary [name] VQQ:[id number] DOB: 18-Dec-1985 DOA: 03/01/2024     3 DOS: the patient was seen and examined on 03/04/2024      Brief hospital course: From HPI "[name] is a 38 y.o. male with medical history significant of HIV, alcohol dependence, schizophrenia, chronic pancreatitis, frequent admissions to the hospital presenting w/ acute on chronic pancreatitis, SBO.  Patient noted to have multiple recurrent admissions for similar issues roughly 3+ admissions in April thus far.  This will be admission #4.  Patient reports generalized abdominal pain with past 4 to 5 days.  Last alcohol intake was roughly 4 days ago with 1 bottle of wine.  Denies any illicit drug use.  No chest pain or shortness of breath.  Mild nausea.  Denies any SI/HI.  No fevers or chills.  Mild diarrhea.  Symptoms similar to previous episodes of pancreatitis.  Denies any illicit drug use. Presented to the ER afebrile, hemodynamically stable.  Satting well on room air.  White count 6, hemoglobin 13.7, platelets 446.  White count 2.4-1.8.  COVID flu and RSV negative.  Alcohol level within normal limits.  Creatinine 0.9.  CT with acute pancreatitis with concern for interval complex pancreatic pseudocyst formation-recommending MRI.  Also with small bowel obstruction on imaging.  Diffuse urinary bladder wall thickening concerning for possible cystitis.  "   Assessment and Plan: Acute on chronic pancreatitis (HCC) Recurrent episodes of pancreatitis in setting of heavy ETOH use  CT imaging consistent with acute pancreatitis and new complex pancreatic pseudocyst Patient has been seen by surgeon and no surgery being planned given no findings of obstruction on repeat imaging Continue as needed pain medication Continue IV fluid MRI showing findings of acute pancreatitis WBC grossly stable  I appreciate input by ICU team We will complete 5 days course of antibiotics/today stable for Endo group found      Alcohol dependence with withdrawal (HCC) Alcohol level within normal limits Noted history of maladaptive behavior patterns concerning for narcotic and sedating medication abuse Continue CIWA protocol Minimize anxiolytic use only as appropriate given history Monitor   HIV disease (HCC) Cont home biktarvy      Ileus (HCC) Noted partial small bowel obstruction versus ileus on imaging.  General Surgery on board No surgery being planned given no concerns of obstruction     Polysubstance abuse (HCC) Urine drug screen pending      Nicotine  dependence, cigarettes, uncomplicated Continue nicotine  patch     High risk for readmission Malingering Baseline history of Frequent admissions secondary to pancreatitis, alcohol withdrawal, and pain seeking behaviors.  Last admission 4/18-4/21 Noted concurrent acute on chronic pancreatitis with pancreatic pseudocyst Minimize narcotics as appropriate Follow closely   Gastritis and duodenitis IV PPI   Severe episode of recurrent major depressive disorder, without psychotic features (HCC) Mood stable  No HI/SI  Cont seroquel  once po status improves     Advance Care Planning:   Code Status: Full Code    Consults: None    Family Communication: No family at the bedside    Subjective:  Patient seen and examined at bedside this morning Patient denies cough chest pain abdominal pain Diet being advanced We will move patient out of the PCU today to the regular floor   Physical Exam: HENT:     Head: Normocephalic and atraumatic.     Nose: Nose normal.     Mouth/Throat:     Mouth: Mucous membranes are dry.  Eyes:  Pupils: Pupils are equal, round, and reactive to light.  Cardiovascular:     Rate and Rhythm: Normal rate and regular rhythm.  Pulmonary:     Effort: Pulmonary effort is normal.  Abdominal:     General: Bowel sounds are normal.     Comments: Positive mild generalized abdominal pain.  Musculoskeletal:        General:  Normal range of motion.  Skin:    General: Skin is dry.  Neurological:     General: No focal deficit present.  Psychiatric:        Mood and Affect: Mood normal.    Data Reviewed: Abdominal imaging reviewed with findings as noted above     Vitals:   03/04/24 1100 03/04/24 1200 03/04/24 1300 03/04/24 1400  BP: (!) 100/59 101/60  (!) 102/54  Pulse: (!) 51 (!) 57 62 (!) 57  Resp: 20 18 20 12   Temp:  98 F (36.7 C)    TempSrc:      SpO2: 97% 100% 97% 97%  Weight:      Height:          Latest Ref Rng & Units 03/04/2024    3:42 AM 03/03/2024    2:57 AM 03/02/2024    5:49 AM  CBC  WBC 4.0 - 10.5 K/uL 3.9  3.8  2.9   Hemoglobin 13.0 - 17.0 g/dL 96.0  45.4  09.8   Hematocrit 39.0 - 52.0 % 29.2  30.0  31.7   Platelets 150 - 400 K/uL 243  263  250        Latest Ref Rng & Units 03/04/2024    3:42 AM 03/03/2024    2:57 AM 03/02/2024    5:49 AM  BMP  Glucose 70 - 99 mg/dL 119  147  93   BUN 6 - 20 mg/dL 23  15  <5   Creatinine 0.61 - 1.24 mg/dL 8.29  5.62  1.30   Sodium 135 - 145 mmol/L 132  134  137   Potassium 3.5 - 5.1 mmol/L 4.1  3.9  3.9   Chloride 98 - 111 mmol/L 104  104  108   CO2 22 - 32 mmol/L 25  25  24    Calcium  8.9 - 10.3 mg/dL 8.3  8.7  8.6      Author: Ezzard Holms, MD 03/04/2024 3:11 PM  For on call review [email or web address].

## 2024-03-05 DIAGNOSIS — K861 Other chronic pancreatitis: Secondary | ICD-10-CM | POA: Diagnosis not present

## 2024-03-05 DIAGNOSIS — K859 Acute pancreatitis without necrosis or infection, unspecified: Secondary | ICD-10-CM | POA: Diagnosis not present

## 2024-03-05 LAB — RENAL FUNCTION PANEL
Albumin: 3.1 g/dL — ABNORMAL LOW (ref 3.5–5.0)
Anion gap: 8 (ref 5–15)
BUN: 28 mg/dL — ABNORMAL HIGH (ref 6–20)
CO2: 25 mmol/L (ref 22–32)
Calcium: 9.1 mg/dL (ref 8.9–10.3)
Chloride: 102 mmol/L (ref 98–111)
Creatinine, Ser: 0.56 mg/dL — ABNORMAL LOW (ref 0.61–1.24)
GFR, Estimated: >60 mL/min (ref >60)
Glucose, Bld: 101 mg/dL — ABNORMAL HIGH (ref 70–99)
Phosphorus: 4.7 mg/dL — ABNORMAL HIGH (ref 2.5–4.6)
Potassium: 4.1 mmol/L (ref 3.5–5.1)
Sodium: 135 mmol/L (ref 135–145)

## 2024-03-05 LAB — CBC WITH DIFFERENTIAL/PLATELET
Abs Immature Granulocytes: 0 10*3/uL (ref 0.00–0.07)
Basophils Absolute: 0 10*3/uL (ref 0.0–0.1)
Basophils Relative: 1 %
Eosinophils Absolute: 0.1 10*3/uL (ref 0.0–0.5)
Eosinophils Relative: 4 %
HCT: 28.5 % — ABNORMAL LOW (ref 39.0–52.0)
Hemoglobin: 9.8 g/dL — ABNORMAL LOW (ref 13.0–17.0)
Immature Granulocytes: 0 %
Lymphocytes Relative: 40 %
Lymphs Abs: 1.6 10*3/uL (ref 0.7–4.0)
MCH: 31.3 pg (ref 26.0–34.0)
MCHC: 34.4 g/dL (ref 30.0–36.0)
MCV: 91.1 fL (ref 80.0–100.0)
Monocytes Absolute: 0.3 10*3/uL (ref 0.1–1.0)
Monocytes Relative: 7 %
Neutro Abs: 1.9 10*3/uL (ref 1.7–7.7)
Neutrophils Relative %: 48 %
Platelets: 212 10*3/uL (ref 150–400)
RBC: 3.13 MIL/uL — ABNORMAL LOW (ref 4.22–5.81)
RDW: 14.4 % (ref 11.5–15.5)
WBC: 3.9 10*3/uL — ABNORMAL LOW (ref 4.0–10.5)
nRBC: 0 % (ref 0.0–0.2)

## 2024-03-05 LAB — MAGNESIUM: Magnesium: 1.9 mg/dL (ref 1.7–2.4)

## 2024-03-05 MED ORDER — FOLIC ACID 1 MG PO TABS: 1.0000 mg | ORAL_TABLET | Freq: Every day | ORAL | 2 refills | Status: DC

## 2024-03-05 MED ORDER — MIDODRINE HCL 10 MG PO TABS: 10.0000 mg | ORAL_TABLET | Freq: Three times a day (TID) | ORAL | 0 refills | Status: DC

## 2024-03-05 MED ORDER — VITAMIN B-1 100 MG PO TABS
100.0000 mg | ORAL_TABLET | Freq: Every day | ORAL | 2 refills | Status: DC
Start: 2024-03-06 — End: 2024-05-26

## 2024-03-05 MED ORDER — SODIUM CHLORIDE 0.9 % IV BOLUS
1000.0000 mL | Freq: Once | INTRAVENOUS | Status: AC
  Administered 2024-03-05: 1000 mL via INTRAVENOUS

## 2024-03-05 MED ORDER — PANTOPRAZOLE SODIUM 40 MG PO TBEC: 40.0000 mg | DELAYED_RELEASE_TABLET | Freq: Two times a day (BID) | ORAL | 2 refills | Status: DC

## 2024-03-05 MED ORDER — SODIUM CHLORIDE 0.9 % IV BOLUS
2000.0000 mL | Freq: Once | INTRAVENOUS | Status: AC
  Administered 2024-03-05: 2000 mL via INTRAVENOUS

## 2024-03-05 MED ORDER — PANCRELIPASE (LIP-PROT-AMYL) 24000-76000 UNITS PO CPEP: 24000.0000 [IU] | ORAL_CAPSULE | Freq: Three times a day (TID) | ORAL | 0 refills | Status: DC

## 2024-03-05 NOTE — Plan of Care (Signed)

## 2024-03-05 NOTE — Progress Notes (Signed)
 Patient discharged to home with all belongings. VSS. A+Ox4. No complaints. Medications and discharge instructions reviewed. All questions answered. PIV x 2 removed, no bleeding, intact. Patient verbalized understanding of signs and symptoms of infection.

## 2024-03-05 NOTE — Plan of Care (Signed)
  Problem: Education: Goal: Knowledge of General Education information will improve Description: Including pain rating scale, medication(s)/side effects and non-pharmacologic comfort measures 03/05/2024 1649 by Josealfredo Adkins N, RN Outcome: Adequate for Discharge 03/05/2024 1648 by Drena Gentile, RN Outcome: Progressing   Problem: Health Behavior/Discharge Planning: Goal: Ability to manage health-related needs will improve 03/05/2024 1649 by Coltan Spinello N, RN Outcome: Adequate for Discharge 03/05/2024 1648 by Drena Gentile, RN Outcome: Progressing   Problem: Clinical Measurements: Goal: Ability to maintain clinical measurements within normal limits will improve 03/05/2024 1649 by Kristan Votta N, RN Outcome: Adequate for Discharge 03/05/2024 1648 by Drena Gentile, RN Outcome: Progressing Goal: Will remain free from infection 03/05/2024 1649 by Branna Cortina N, RN Outcome: Adequate for Discharge 03/05/2024 1648 by Neyland Pettengill N, RN Outcome: Progressing Goal: Diagnostic test results will improve 03/05/2024 1649 by Alveena Taira N, RN Outcome: Adequate for Discharge 03/05/2024 1648 by Filippa Yarbough N, RN Outcome: Progressing Goal: Respiratory complications will improve 03/05/2024 1649 by Drucilla Cumber N, RN Outcome: Adequate for Discharge 03/05/2024 1648 by Clarice Bonaventure N, RN Outcome: Progressing Goal: Cardiovascular complication will be avoided 03/05/2024 1649 by Drena Gentile, RN Outcome: Adequate for Discharge 03/05/2024 1648 by Drena Gentile, RN Outcome: Progressing   Problem: Activity: Goal: Risk for activity intolerance will decrease 03/05/2024 1649 by Drena Gentile, RN Outcome: Adequate for Discharge 03/05/2024 1648 by Drena Gentile, RN Outcome: Progressing   Problem: Nutrition: Goal: Adequate nutrition will be maintained 03/05/2024 1649 by Drena Gentile, RN Outcome: Adequate for Discharge 03/05/2024 1648 by Drena Gentile, RN Outcome:  Progressing   Problem: Coping: Goal: Level of anxiety will decrease 03/05/2024 1649 by Shella Lahman N, RN Outcome: Adequate for Discharge 03/05/2024 1648 by Drena Gentile, RN Outcome: Progressing   Problem: Elimination: Goal: Will not experience complications related to bowel motility 03/05/2024 1649 by Drena Gentile, RN Outcome: Adequate for Discharge 03/05/2024 1648 by Drena Gentile, RN Outcome: Progressing Goal: Will not experience complications related to urinary retention 03/05/2024 1649 by Drena Gentile, RN Outcome: Adequate for Discharge 03/05/2024 1648 by Drena Gentile, RN Outcome: Progressing   Problem: Pain Managment: Goal: General experience of comfort will improve and/or be controlled 03/05/2024 1649 by Dawnya Grams N, RN Outcome: Adequate for Discharge 03/05/2024 1648 by Drena Gentile, RN Outcome: Progressing   Problem: Safety: Goal: Ability to remain free from injury will improve 03/05/2024 1649 by Drena Gentile, RN Outcome: Adequate for Discharge 03/05/2024 1648 by Drena Gentile, RN Outcome: Progressing   Problem: Skin Integrity: Goal: Risk for impaired skin integrity will decrease 03/05/2024 1649 by Drena Gentile, RN Outcome: Adequate for Discharge 03/05/2024 1648 by Braylin Formby N, RN Outcome: Progressing

## 2024-03-05 NOTE — Discharge Summary (Signed)
 Physician Discharge Summary   Patient: Gary [name] MRN: [id number] DOB: 1986-09-24  Admit date:     03/01/2024  Discharge date: 03/05/24  Discharge Physician: Ezzard Holms   PCP: Pcp, No   Recommendations at discharge:   Avoid alcohol use Follow-up with PCP  Discharge Diagnoses: Acute on chronic pancreatitis (HCC) Alcohol dependence with withdrawal (HCC) HIV disease (HCC) Ileus (HCC) Polysubstance abuse (HCC) Nicotine  dependence, cigarettes, uncomplicated High risk for readmission Malingering Gastritis and duodenitis Severe episode of recurrent major depressive disorder, without psychotic features [facility])   Hospital Course:  Gary [name] is a 38 y.o. male with medical history significant of HIV, alcohol dependence, schizophrenia, chronic pancreatitis, frequent admissions to the hospital presenting w/ acute on chronic pancreatitis, SBO.  Patient noted to have multiple recurrent admissions for similar issues roughly 3+ admissions in April thus far.  This will be admission #4.  Patient reports generalized abdominal pain with past 4 to 5 days.  Last alcohol intake was roughly 4 days ago with 1 bottle of wine.  Denies any illicit drug use.  No chest pain or shortness of breath.  Mild nausea.  Denies any SI/HI.  No fevers or chills.  Mild diarrhea.  Symptoms similar to previous episodes of pancreatitis.  Denies any illicit drug use. Presented to the ER afebrile, hemodynamically stable.  Satting well on room air.  White count 6, hemoglobin 13.7, platelets 446.  White count 2.4-1.8.  COVID flu and RSV negative.  Alcohol level within normal limits.  Creatinine 0.9.  CT with acute pancreatitis with concern for interval complex pancreatic pseudocyst formation-recommending MRI.  MRI of the abdomen showed findings of ileus.findings of acute pancreatitis but no abscess or necrosis.  Patient's abdominal pain resolved able to tolerate diet and move about with no complaints.  He stable for being  discharged to follow-up with PCP.      Pain control - [place]  Controlled Substance Reporting System database was reviewed. and patient was instructed, not to drive, operate heavy machinery, perform activities at heights, swimming or participation in water  activities or provide baby-sitting services while on Pain, Sleep and Anxiety Medications; until their outpatient Physician has advised to do so again. Also recommended to not to take more than prescribed Pain, Sleep and Anxiety Medications.  Consultants: Intensivist Procedures performed: None Disposition: Home Diet recommendation:  Cardiac diet DISCHARGE MEDICATION: Allergies as of 03/05/2024       Reactions   Tegretol  [carbamazepine ] Other (See Comments)   Vertigo  Paralysis   Caffeine Palpitations        Medication List     TAKE these medications    acetaminophen  325 MG tablet Commonly known as: TYLENOL  Take 2 tablets (650 mg total) by mouth 3 (three) times daily as needed for up to 10 days for mild pain (pain score 1-3), moderate pain (pain score 4-6), headache or fever.   Biktarvy  50-200-25 MG Tabs tablet Generic drug: bictegravir-emtricitabine -tenofovir  AF Take 1 tablet by mouth daily.   folic acid  1 MG tablet Commonly known as: FOLVITE  Take 1 tablet (1 mg total) by mouth daily. Start taking on: March 06, 2024   gabapentin  300 MG capsule Commonly known as: NEURONTIN  Take 2 capsules (600 mg total) by mouth 3 (three) times daily for 14 days.   meloxicam  15 MG tablet Commonly known as: MOBIC  Take 1 tablet (15 mg total) by mouth daily for 14 days.   Pancrelipase  (Lip-Prot-Amyl) 24000-76000 units Cpep Take 1 capsule (24,000 Units total) by mouth 3 (three)  times daily before meals.   pantoprazole  40 MG tablet Commonly known as: PROTONIX  Take 1 tablet (40 mg total) by mouth 2 (two) times daily.   QUEtiapine  50 MG tablet Commonly known as: SEROQUEL  Take 1 tablet (50 mg total) by mouth 2 (two) times  daily.   thiamine  100 MG tablet Commonly known as: VITAMIN B1 Take 1 tablet (100 mg total) by mouth daily. Start taking on: March 06, 2024        Discharge Exam: Gary [name] Weights   03/03/24 0500 03/04/24 0500 03/05/24 0500  Weight: 59.7 kg 60.9 kg 62.3 kg   General: Young male laying in bed in no distress Eyes:     Pupils: Pupils are equal, round, and reactive to light.  Cardiovascular:     Rate and Rhythm: Normal rate and regular rhythm.  Pulmonary:     Effort: Pulmonary effort is normal.  Abdominal:     General: Bowel sounds are normal.     Comments: Nontender Musculoskeletal:        General: Normal range of motion.  Skin:    General: Skin is dry.  Neurological:     General: No focal deficit present.  Psychiatric:        Mood and Affect: Mood normal.     Condition at discharge: good  The results of significant diagnostics from this hospitalization (including imaging, microbiology, ancillary and laboratory) are listed below for reference.   Imaging Studies: DG Abd 2 Views Result Date: 03/02/2024 CLINICAL DATA:  Pancreatitis EXAM: ABDOMEN - 2 VIEW COMPARISON:  CT from the previous day. FINDINGS: Scattered large and small bowel gas is noted. No obstructive changes are seen. No free air is identified. No abnormal mass or abnormal calcifications are seen. No bony abnormality is noted. IMPRESSION: No acute abnormality seen. Electronically Signed   By: Violeta Grey M.D.   On: 03/02/2024 09:31   MR ABDOMEN MRCP W WO CONTAST Result Date: 03/01/2024 CLINICAL DATA:  Acute pancreatitis, abdominal pain EXAM: MRI ABDOMEN WITHOUT AND WITH CONTRAST (INCLUDING MRCP) TECHNIQUE: Multiplanar multisequence MR imaging of the abdomen was performed both before and after the administration of intravenous contrast. Heavily T2-weighted images of the biliary and pancreatic ducts were obtained, and three-dimensional MRCP images were rendered by post processing. Post-processing was applied at the  acquisition scanner with concurrent physician supervision which includes 3D reconstructions, MIPs, volume rendered images and/or shaded surface rendering. CONTRAST:  6mL GADAVIST  GADOBUTROL  1 MMOL/ML IV SOLN COMPARISON:  CT scan 03/02/2019 FINDINGS: Despite efforts by the technologist and patient, motion artifact is present on today's exam and could not be eliminated. This reduces exam sensitivity and specificity. Lower chest: Mildly elevated left hemidiaphragm. Hepatobiliary: Common hepatic duct 0.9 cm in diameter and common bile duct 0.6 cm in diameter, mildly dilated. No definite choledocholithiasis, sensitivity reduced by motion artifact. Questionable tiny dependent calculus in the gallbladder on image 19 series 3. Pancreas: Shortness/truncated pancreatic tail. No dorsal pancreatic duct dilatation. Indistinct tissue planes around the pancreas suggesting pancreatitis. Below the pancreatic head, a bilobed 5.6 by 3.4 by 4.5 cm collection with high internal T1 signal likely represents a pseudocyst with internal proteinaceous or hemorrhagic components. No definite internal gas density to indicate abscess. No compelling findings of pancreatic necrosis at this time. No definite enhancement in the complex bilobed peripancreatic lesion although subtraction images are limited by misregistration due to motion artifact. Spleen:  Unremarkable Adrenals/Urinary Tract: 1.1 cm Bosniak category 2 cyst of the right kidney lower pole. This lesion appears benign and  does not require further imaging workup. Stomach/Bowel: As on the CT examination, there are mildly dilated loops of jejunum up to about 3.5 cm, likely reflecting local ileus. Vascular/Lymphatic: The portal vein is narrowed near the confluence of the SMV and splenic vein. The splenic vein appears patent. The SMV is severely effaced in the vicinity of the presumed hemorrhagic pseudocyst although does demonstrate some flow signal below this level. I do not see obvious  thrombus in the SMV. Other: Edema along the root of the mesentery along with scattered small mesenteric lymph nodes which are likely reactive. Musculoskeletal: Unremarkable IMPRESSION: 1. Acute pancreatitis with a bilobed 5.6 by 3.4 by 4.5 cm collection below the pancreatic head with high internal T1 signal likely representing a pseudocyst with internal proteinaceous or hemorrhagic components. No definite internal gas density to indicate abscess. No compelling findings of pancreatic necrosis at this time. 2. The SMV is severely effaced in the vicinity of the presumed hemorrhagic pseudocyst although does demonstrate some flow signal below this level. The portal vein is narrowed near the confluence of the SMV and splenic vein. No definite thrombus within the venous structures. 3. Mildly dilated loops of jejunum up to about 3.5 cm, likely reflecting local ileus. 4. Edema along the root of the mesentery along with scattered small mesenteric lymph nodes which are likely reactive. 5. Mildly dilated common hepatic duct and common bile duct, without definite choledocholithiasis. Questionable tiny dependent calculus in the gallbladder. 6. Mildly elevated left hemidiaphragm. Electronically Signed   By: Freida Jes M.D.   On: 03/01/2024 10:10   MR 3D Recon At Scanner Result Date: 03/01/2024 CLINICAL DATA:  Acute pancreatitis, abdominal pain EXAM: MRI ABDOMEN WITHOUT AND WITH CONTRAST (INCLUDING MRCP) TECHNIQUE: Multiplanar multisequence MR imaging of the abdomen was performed both before and after the administration of intravenous contrast. Heavily T2-weighted images of the biliary and pancreatic ducts were obtained, and three-dimensional MRCP images were rendered by post processing. Post-processing was applied at the acquisition scanner with concurrent physician supervision which includes 3D reconstructions, MIPs, volume rendered images and/or shaded surface rendering. CONTRAST:  6mL GADAVIST  GADOBUTROL  1 MMOL/ML IV  SOLN COMPARISON:  CT scan 03/02/2019 FINDINGS: Despite efforts by the technologist and patient, motion artifact is present on today's exam and could not be eliminated. This reduces exam sensitivity and specificity. Lower chest: Mildly elevated left hemidiaphragm. Hepatobiliary: Common hepatic duct 0.9 cm in diameter and common bile duct 0.6 cm in diameter, mildly dilated. No definite choledocholithiasis, sensitivity reduced by motion artifact. Questionable tiny dependent calculus in the gallbladder on image 19 series 3. Pancreas: Shortness/truncated pancreatic tail. No dorsal pancreatic duct dilatation. Indistinct tissue planes around the pancreas suggesting pancreatitis. Below the pancreatic head, a bilobed 5.6 by 3.4 by 4.5 cm collection with high internal T1 signal likely represents a pseudocyst with internal proteinaceous or hemorrhagic components. No definite internal gas density to indicate abscess. No compelling findings of pancreatic necrosis at this time. No definite enhancement in the complex bilobed peripancreatic lesion although subtraction images are limited by misregistration due to motion artifact. Spleen:  Unremarkable Adrenals/Urinary Tract: 1.1 cm Bosniak category 2 cyst of the right kidney lower pole. This lesion appears benign and does not require further imaging workup. Stomach/Bowel: As on the CT examination, there are mildly dilated loops of jejunum up to about 3.5 cm, likely reflecting local ileus. Vascular/Lymphatic: The portal vein is narrowed near the confluence of the SMV and splenic vein. The splenic vein appears patent. The SMV is severely effaced in the  vicinity of the presumed hemorrhagic pseudocyst although does demonstrate some flow signal below this level. I do not see obvious thrombus in the SMV. Other: Edema along the root of the mesentery along with scattered small mesenteric lymph nodes which are likely reactive. Musculoskeletal: Unremarkable IMPRESSION: 1. Acute pancreatitis  with a bilobed 5.6 by 3.4 by 4.5 cm collection below the pancreatic head with high internal T1 signal likely representing a pseudocyst with internal proteinaceous or hemorrhagic components. No definite internal gas density to indicate abscess. No compelling findings of pancreatic necrosis at this time. 2. The SMV is severely effaced in the vicinity of the presumed hemorrhagic pseudocyst although does demonstrate some flow signal below this level. The portal vein is narrowed near the confluence of the SMV and splenic vein. No definite thrombus within the venous structures. 3. Mildly dilated loops of jejunum up to about 3.5 cm, likely reflecting local ileus. 4. Edema along the root of the mesentery along with scattered small mesenteric lymph nodes which are likely reactive. 5. Mildly dilated common hepatic duct and common bile duct, without definite choledocholithiasis. Questionable tiny dependent calculus in the gallbladder. 6. Mildly elevated left hemidiaphragm. Electronically Signed   By: Freida Jes M.D.   On: 03/01/2024 10:10   CT ABDOMEN PELVIS W CONTRAST Result Date: 03/01/2024 CLINICAL DATA:  Generalized abdominal pain. EXAM: CT ABDOMEN AND PELVIS WITH CONTRAST TECHNIQUE: Multidetector CT imaging of the abdomen and pelvis was performed using the standard protocol following bolus administration of intravenous contrast. RADIATION DOSE REDUCTION: This exam was performed according to the departmental dose-optimization program which includes automated exposure control, adjustment of the mA and/or kV according to patient size and/or use of iterative reconstruction technique. CONTRAST:  OMNIPAQUE  IOHEXOL  300 MG/ML  SOLN COMPARISON:  February 02, 2024 FINDINGS: Lower chest: No acute abnormality. Hepatobiliary: No focal liver abnormality is seen. No gallstones, gallbladder wall thickening, or biliary dilatation. Pancreas: Since the prior exam there is been interval development of adjacent 3.3 cm x 2.6 cm x  3.6 cm and 3.5 cm x 2.8 cm x 4.3 cm well-defined heterogeneous mildly hyperdense lesions anterior and posterior to the junction of the pancreatic body and head (axial CT images 25 through 33, CT series 2). These areas may be interconnected inferiorly. Peripancreatic fluid and inflammatory stranding is again seen along the posterior aspect of the body of the pancreas. The 1.6 cm proximal pancreas fluid dense lesion seen on the prior study is again visualized and measures 6.7 mm x 11.5 mm on the current study (axial CT image 26, CT series 2). Spleen: Normal in size without focal abnormality. Adrenals/Urinary Tract: Adrenal glands are unremarkable. Kidneys are normal in size, without renal calculi or hydronephrosis. A stable subcentimeter simple cyst is seen within the right kidney. The urinary bladder is poorly distended and subsequently limited in evaluation. Moderate to marked severity diffuse urinary bladder wall thickening is seen. Stomach/Bowel: Stomach is within normal limits. Appendix appears normal. Dilated small bowel loops are again seen within the mid to lower abdomen (maximum small bowel diameter of approximately 4.0 cm). A clear transition zone is not identified. Noninflamed diverticula are seen within the descending and sigmoid colon. Vascular/Lymphatic: No significant vascular findings are present. No enlarged abdominal or pelvic lymph nodes. Reproductive: Mild to moderate severity prostate gland enlargement is noted. Other: No abdominal wall hernia or abnormality. No abdominopelvic ascites. Musculoskeletal: No acute or significant osseous findings. IMPRESSION: 1. Acute pancreatitis with findings suspicious for interval complex pancreatic pseudocyst formation since the prior  study. MRI correlation is recommended. 2. Persistent small bowel dilatation consistent with a partial small bowel obstruction versus ileus. 3. Diffuse urinary bladder wall thickening which may represent acute cystitis. Correlation  with urinalysis is recommended. 4. Colonic diverticulosis. Electronically Signed   By: Virgle Grime M.D.   On: 03/01/2024 03:49   DG Chest Port 1 View Result Date: 03/01/2024 CLINICAL DATA:  Possible sepsis EXAM: PORTABLE CHEST 1 VIEW COMPARISON:  02/13/2024 FINDINGS: Elevation of the left diaphragm. No consolidation, pleural effusion or pneumothorax. Stable cardiomediastinal silhouette. No pneumothorax IMPRESSION: No active disease. Electronically Signed   By: Esmeralda Hedge M.D.   On: 03/01/2024 00:57   DG Chest Portable 1 View Result Date: 02/13/2024 CLINICAL DATA:  Chest pain. EXAM: PORTABLE CHEST 1 VIEW COMPARISON:  Chest radiograph dated 11/25/2023. FINDINGS: Similar rotation to the left. The heart size and mediastinal contours are unchanged. Similar mild elevation of the left hemidiaphragm. No focal consolidation, pleural effusion, or pneumothorax. No acute osseous abnormality. IMPRESSION: No acute cardiopulmonary findings. Electronically Signed   By: Mannie Seek M.D.   On: 02/13/2024 10:38    Microbiology: Results for orders placed or performed during the hospital encounter of 03/01/24  Blood Culture (routine x 2)     Status: None (Preliminary result)   Collection Time: 03/01/24  1:11 AM   Specimen: BLOOD  Result Value Ref Range Status   Specimen Description BLOOD LA  Final   Special Requests   Final    BOTTLES DRAWN AEROBIC AND ANAEROBIC Blood Culture results may not be optimal due to an inadequate volume of blood received in culture bottles   Culture   Final    NO GROWTH 4 DAYS Performed at [facility], [address] [place], [place] [address]    Report Status PENDING  Incomplete  Blood Culture (routine x 2)     Status: None (Preliminary result)   Collection Time: 03/01/24  2:08 AM   Specimen: BLOOD  Result Value Ref Range Status   Specimen Description BLOOD LA  Final   Special Requests   Final    BOTTLES DRAWN AEROBIC AND ANAEROBIC Blood Culture results  may not be optimal due to an inadequate volume of blood received in culture bottles   Culture   Final    NO GROWTH 4 DAYS Performed at [facility], [address], [place], [place] [address]    Report Status PENDING  Incomplete  Resp panel by RT-PCR (RSV, Flu A&B, Covid) Anterior Nasal Swab     Status: None   Collection Time: 03/01/24  2:19 AM   Specimen: Anterior Nasal Swab  Result Value Ref Range Status   SARS Coronavirus 2 by RT PCR NEGATIVE NEGATIVE Final    Comment: (NOTE) SARS-CoV-2 target nucleic acids are NOT DETECTED.  The SARS-CoV-2 RNA is generally detectable in upper respiratory specimens during the acute phase of infection. The lowest concentration of SARS-CoV-2 viral copies this assay can detect is 138 copies/mL. A negative result does not preclude SARS-Cov-2 infection and should not be used as the sole basis for treatment or other patient management decisions. A negative result may occur with  improper specimen collection/handling, submission of specimen other than nasopharyngeal swab, presence of viral mutation(s) within the areas targeted by this assay, and inadequate number of viral copies(<138 copies/mL). A negative result must be combined with clinical observations, patient history, and epidemiological information. The expected result is Negative.  Fact Sheet for Patients:  BloggerCourse.com  Fact Sheet for Healthcare Providers:  SeriousBroker.it  This test is no t yet approved or cleared by the [place]  FDA and  has been authorized for detection and/or diagnosis of SARS-CoV-2 by FDA under an Emergency Use Authorization (EUA). This EUA will remain  in effect (meaning this test can be used) for the duration of the COVID-19 declaration under Section 564(b)(1) of the Act, 21 U.S.C.section 360bbb-3(b)(1), unless the authorization is terminated  or revoked sooner.       Influenza A by PCR  NEGATIVE NEGATIVE Final   Influenza B by PCR NEGATIVE NEGATIVE Final    Comment: (NOTE) The Xpert Xpress SARS-CoV-2/FLU/RSV plus assay is intended as an aid in the diagnosis of influenza from Nasopharyngeal swab specimens and should not be used as a sole basis for treatment. Nasal washings and aspirates are unacceptable for Xpert Xpress SARS-CoV-2/FLU/RSV testing.  Fact Sheet for Patients: BloggerCourse.com  Fact Sheet for Healthcare Providers: SeriousBroker.it  This test is not yet approved or cleared by the [place]  FDA and has been authorized for detection and/or diagnosis of SARS-CoV-2 by FDA under an Emergency Use Authorization (EUA). This EUA will remain in effect (meaning this test can be used) for the duration of the COVID-19 declaration under Section 564(b)(1) of the Act, 21 U.S.C. section 360bbb-3(b)(1), unless the authorization is terminated or revoked.     Resp Syncytial Virus by PCR NEGATIVE NEGATIVE Final    Comment: (NOTE) Fact Sheet for Patients: BloggerCourse.com  Fact Sheet for Healthcare Providers: SeriousBroker.it  This test is not yet approved or cleared by the [place]  FDA and has been authorized for detection and/or diagnosis of SARS-CoV-2 by FDA under an Emergency Use Authorization (EUA). This EUA will remain in effect (meaning this test can be used) for the duration of the COVID-19 declaration under Section 564(b)(1) of the Act, 21 U.S.C. section 360bbb-3(b)(1), unless the authorization is terminated or revoked.  Performed at [facility], [address] [place], [place] [address]   C Difficile Quick Screen w PCR reflex     Status: None   Collection Time: 03/01/24  8:12 AM   Specimen: STOOL  Result Value Ref Range Status   C Diff antigen NEGATIVE NEGATIVE Final   C Diff toxin NEGATIVE NEGATIVE Final   C Diff interpretation No  C. difficile detected.  Final    Comment: Performed at [facility], [address] [place], [place] [address]  Gastrointestinal Panel by PCR , Stool     Status: None   Collection Time: 03/01/24  8:12 AM   Specimen: STOOL  Result Value Ref Range Status   Campylobacter species NOT DETECTED NOT DETECTED Final   Plesimonas shigelloides NOT DETECTED NOT DETECTED Final   Salmonella species NOT DETECTED NOT DETECTED Final   Yersinia enterocolitica NOT DETECTED NOT DETECTED Final   Vibrio species NOT DETECTED NOT DETECTED Final   Vibrio cholerae NOT DETECTED NOT DETECTED Final   Enteroaggregative E coli (EAEC) NOT DETECTED NOT DETECTED Final   Enteropathogenic E coli (EPEC) NOT DETECTED NOT DETECTED Final   Enterotoxigenic E coli (ETEC) NOT DETECTED NOT DETECTED Final   Shiga like toxin producing E coli (STEC) NOT DETECTED NOT DETECTED Final   Shigella/Enteroinvasive E coli (EIEC) NOT DETECTED NOT DETECTED Final   Cryptosporidium NOT DETECTED NOT DETECTED Final   Cyclospora cayetanensis NOT DETECTED NOT DETECTED Final   Entamoeba histolytica NOT DETECTED NOT DETECTED Final   Giardia lamblia NOT DETECTED NOT DETECTED Final   Adenovirus F40/41 NOT DETECTED NOT DETECTED Final  Astrovirus NOT DETECTED NOT DETECTED Final   Norovirus GI/GII NOT DETECTED NOT DETECTED Final   Rotavirus A NOT DETECTED NOT DETECTED Final   Sapovirus (I, II, IV, and V) NOT DETECTED NOT DETECTED Final    Comment: Performed at [facility], [address] [place], [place] [address]  MRSA Next Gen by PCR, Nasal     Status: None   Collection Time: 03/02/24  5:32 AM   Specimen: Nasal Mucosa; Nasal Swab  Result Value Ref Range Status   MRSA by PCR Next Gen NOT DETECTED NOT DETECTED Final    Comment: (NOTE) The GeneXpert MRSA Assay (FDA approved for NASAL specimens only), is one component of a comprehensive MRSA colonization surveillance program. It is not intended to diagnose MRSA infection nor to  guide or monitor treatment for MRSA infections. Test performance is not FDA approved in patients less than 16 years old. Performed at [facility], [address] [place], [place] [address]     Labs: CBC: Recent Labs  Lab 02/28/24 [phone] 03/01/24 0111 03/02/24 0549 03/03/24 0257 03/04/24 0342 03/05/24 0506  WBC 2.6* 5.8 2.9* 3.8* 3.9* 3.9*  NEUTROABS 1.2* 3.1  --  1.8 1.9 1.9  HGB 9.4* 13.7 10.9* 10.3* 10.3* 9.8*  HCT 28.1* 40.4 31.7* 30.0* 29.2* 28.5*  MCV 90.9 90.8 89.5 89.6 88.8 91.1  PLT 172 446* 250 263 243 212   Basic Metabolic Panel: Recent Labs  Lab 03/01/24 1950 03/02/24 0549 03/03/24 0257 03/04/24 0342 03/05/24 0506  NA 137 137 134* 132* 135  K 4.0 3.9 3.9 4.1 4.1  CL 112* 108 104 104 102  CO2 20* 24 25 25 25   GLUCOSE 92 93 100* 113* 101*  BUN 6 <5* 15 23* 28*  CREATININE 0.61 0.79 0.79 0.64 0.56*  CALCIUM  8.2* 8.6* 8.7* 8.3* 9.1  MG 1.6* 2.2 1.9 1.9 1.9  PHOS 3.6 4.0 4.5 4.3 4.7*   Liver Function Tests: Recent Labs  Lab 02/28/24 0608 03/01/24 0111 03/01/24 1950 03/02/24 0549 03/03/24 0257 03/04/24 0342 03/05/24 0506  AST 15 22 11* 12*  --   --   --   ALT 10 13 10 9   --   --   --   ALKPHOS 40 75 41 44  --   --   --   BILITOT 0.3 1.2 0.5 0.5  --   --   --   PROT 5.6* 9.4* 5.4* 6.0*  --   --   --   ALBUMIN 2.8* 5.2* 2.8* 3.1* 3.0* 2.9* 3.1*   CBG: Recent Labs  Lab 03/02/24 0517  GLUCAP 74    Discharge time spent:  37 minutes.  Signed: Ezzard Holms, MD [facility] 03/05/2024

## 2024-03-06 LAB — CULTURE, BLOOD (ROUTINE X 2)
Culture: NO GROWTH
Culture: NO GROWTH

## 2024-03-29 ENCOUNTER — Other Ambulatory Visit: Payer: Self-pay

## 2024-03-29 ENCOUNTER — Emergency Department
Admission: EM | Admit: 2024-03-29 | Discharge: 2024-03-29 | Disposition: A | Discharge status: Home / Self Care | Payer: MEDICAID | Attending: Emergency Medicine | Admitting: Emergency Medicine

## 2024-03-29 DIAGNOSIS — Y908 Blood alcohol level of 240 mg/100 ml or more: Secondary | ICD-10-CM | POA: Insufficient documentation

## 2024-03-29 DIAGNOSIS — F10129 Alcohol abuse with intoxication, unspecified: Secondary | ICD-10-CM | POA: Insufficient documentation

## 2024-03-29 DIAGNOSIS — F10920 Alcohol use, unspecified with intoxication, uncomplicated: Secondary | ICD-10-CM

## 2024-03-29 DIAGNOSIS — F1010 Alcohol abuse, uncomplicated: Secondary | ICD-10-CM | POA: Diagnosis present

## 2024-03-29 LAB — COMPREHENSIVE METABOLIC PANEL WITH GFR
ALT: 17 U/L (ref 0–44)
AST: 28 U/L (ref 15–41)
Albumin: 3.9 g/dL (ref 3.5–5.0)
Alkaline Phosphatase: 47 U/L (ref 38–126)
Anion gap: 10 (ref 5–15)
BUN: 9 mg/dL (ref 6–20)
CO2: 23 mmol/L (ref 22–32)
Calcium: 8.8 mg/dL — ABNORMAL LOW (ref 8.9–10.3)
Chloride: 109 mmol/L (ref 98–111)
Creatinine, Ser: 0.58 mg/dL — ABNORMAL LOW (ref 0.61–1.24)
GFR, Estimated: >60 mL/min (ref >60)
Glucose, Bld: 94 mg/dL (ref 70–99)
Potassium: 3.6 mmol/L (ref 3.5–5.1)
Sodium: 142 mmol/L (ref 135–145)
Total Bilirubin: 0.7 mg/dL (ref 0.0–1.2)
Total Protein: 6.9 g/dL (ref 6.5–8.1)

## 2024-03-29 LAB — CBC
HCT: 32 % — ABNORMAL LOW (ref 39.0–52.0)
Hemoglobin: 10.4 g/dL — ABNORMAL LOW (ref 13.0–17.0)
MCH: 29.9 pg (ref 26.0–34.0)
MCHC: 32.5 g/dL (ref 30.0–36.0)
MCV: 92 fL (ref 80.0–100.0)
Platelets: 156 10*3/uL (ref 150–400)
RBC: 3.48 MIL/uL — ABNORMAL LOW (ref 4.22–5.81)
RDW: 16 % — ABNORMAL HIGH (ref 11.5–15.5)
WBC: 3.6 10*3/uL — ABNORMAL LOW (ref 4.0–10.5)
nRBC: 0 % (ref 0.0–0.2)

## 2024-03-29 LAB — ETHANOL: Alcohol, Ethyl (B): 374 mg/dL (ref <15)

## 2024-03-29 LAB — LIPASE, BLOOD: Lipase: 61 U/L — ABNORMAL HIGH (ref 11–51)

## 2024-03-29 MED ORDER — LORAZEPAM 1 MG PO TABS: 1.0000 mg | ORAL_TABLET | ORAL | Status: DC | PRN

## 2024-03-29 MED ORDER — THIAMINE HCL 100 MG/ML IJ SOLN: 100.0000 mg | Freq: Every day | INTRAMUSCULAR | Status: DC

## 2024-03-29 MED ORDER — ADULT MULTIVITAMIN W/MINERALS CH
1.0000 | ORAL_TABLET | Freq: Every day | ORAL | Status: DC
  Administered 2024-03-29: 1 via ORAL
  Filled 2024-03-29: qty 1

## 2024-03-29 MED ORDER — THIAMINE MONONITRATE 100 MG PO TABS
100.0000 mg | ORAL_TABLET | Freq: Every day | ORAL | Status: DC
  Administered 2024-03-29: 100 mg via ORAL
  Filled 2024-03-29: qty 1

## 2024-03-29 MED ORDER — FOLIC ACID 1 MG PO TABS
1.0000 mg | ORAL_TABLET | Freq: Every day | ORAL | Status: DC
  Administered 2024-03-29: 1 mg via ORAL
  Filled 2024-03-29: qty 1

## 2024-03-29 NOTE — ED Provider Notes (Signed)
 [facility] Provider Note    Event Date/Time   First MD Initiated Contact with Patient 03/29/24 1358     (approximate)   History   Alcohol Problem   HPI  Gary [name] is a 38 y.o. male history of pancreatitis, alcohol withdrawal, depression and substance use disorder  Patient tells me that he called EMS to "get help" for his alcohol.  He decided to stop drinking this morning.  He needs help.  The patient also made vague statements of suicidal thoughts, but tells me he is not suicidal.  He tells me that he needs treatment for his alcohol.  Denies abdominal pain.  Reports he called EMS to get help before things get out of control.  He relates that he has had issues with violence, agitation in the past but feels like he needs to get help before things get out of control.  Currently reports he needs alcohol treatment       Physical Exam   Triage Vital Signs: ED Triage Vitals [03/29/24 1400]  Encounter Vitals Group     BP 91/67     Systolic BP Percentile      Diastolic BP Percentile      Pulse Rate 77     Resp 19     Temp 98.1 F (36.7 C)     Temp src      SpO2 99 %     Weight 137 lb 5.6 oz (62.3 kg)     Height 6' (1.829 m)     Head Circumference      Peak Flow      Pain Score 0     Pain Loc      Pain Education      Exclude from Growth Chart     Most recent vital signs: Vitals:   03/29/24 1430 03/29/24 1500  BP: 92/66 95/66  Pulse: 63 63  Resp: 16 17  Temp:    SpO2: 96% 96%     General: Awake, no distress.  Head is speech seems slightly slurred. He is calm, but reports that he can get on edge pretty quickly.  He does not want to harm himself or anyone else right now, but tells me at least 2-3 times that he is here for alcohol treatment.  He does suffer from seizures when he stopped drinking sometime CV:  Good peripheral perfusion.  Normal tones and rate Resp:  Normal effort.  Abd:  No distention.  No abdominal pain per patient,  refuses to allow exam at this time.  No active vomiting or emesis Other:     ED Results / Procedures / Treatments   Labs (all labs ordered are listed, but only abnormal results are displayed) Labs Reviewed  COMPREHENSIVE METABOLIC PANEL WITH GFR - Abnormal; Notable for the following components:      Result Value   Creatinine, Ser 0.58 (*)    Calcium  8.8 (*)    All other components within normal limits  ETHANOL - Abnormal; Notable for the following components:   Alcohol, Ethyl (B) 374 (*)    All other components within normal limits  CBC - Abnormal; Notable for the following components:   WBC 3.6 (*)    RBC 3.48 (*)    Hemoglobin 10.4 (*)    HCT 32.0 (*)    RDW 16.0 (*)    All other components within normal limits  LIPASE, BLOOD - Abnormal; Notable for the following components:   Lipase 61 (*)  All other components within normal limits  URINE DRUG SCREEN, QUALITATIVE ([facility] ONLY)   Hemoglobin 10.4 with mild leukopia which appears to be chronic  EKG     RADIOLOGY     PROCEDURES:  Critical Care performed: No  Procedures   MEDICATIONS ORDERED IN ED: Medications  LORazepam  (ATIVAN ) tablet 1-4 mg (has no administration in time range)  thiamine  (VITAMIN B1) tablet 100 mg (has no administration in time range)    Or  thiamine  (VITAMIN B1) injection 100 mg (has no administration in time range)  folic acid  (FOLVITE ) tablet 1 mg (has no administration in time range)  multivitamin with minerals tablet 1 tablet (has no administration in time range)     IMPRESSION / MDM / ASSESSMENT AND PLAN / ED COURSE  I reviewed the triage vital signs and the nursing notes.                              Differential diagnosis includes, but is not limited to, substance abuse treatment, alcohol abuse treatment, known history of alcohol abuse and recent admission for pancreatitis.  Currently not having any active abdominal pain or vomiting, but certainly at high risk for withdrawals.   He does report his last alcohol was drink this morning.  He is gives a somewhat vague history to EMS and nursing about suicidal ideation, but at the moment he reports to me that he is here for alcohol treatment and is not having active suicidal ideation.  He is currently voluntary.  Should his situation change or he starts to do note that he is having active suicidal ideation I would consider placing him under IVC, but not at this time given his motivation/presentation based on substance abuse needs and alcohol treatment.  Patient has been seen evaluated multiple times in the ER over the last 6 months.  Patient's presentation is most consistent with acute complicated illness / injury requiring diagnostic workup.  ----------------------------------------- 3:26 PM on 03/29/2024 ----------------------------------------- Patient reexamined, currently sleeping without distress.  Ongoing care assigned to Dr. Johnetta Nab.  Follow-up on consults by TTS and psychiatry.  Patient currently voluntary  Ethanol level noted at 374.  Suspect this is most attributable to the patient's slurring of speech.  He is likely has significant chronic alcoholic, with his ability to maintain alertness without evidence of acute instability with a alcohol level that is quite elevated  His LFTs and lipase are reassuring.  No evidence of acute pancreatitis by clinical history or labs.      FINAL CLINICAL IMPRESSION(S) / ED DIAGNOSES   Final diagnoses:  Alcoholic intoxication without complication (HCC)     Rx / DC Orders   ED Discharge Orders     None        Note:  This document was prepared using Dragon voice recognition software and may include unintentional dictation errors.   Iver Marker, MD 03/29/24 1530

## 2024-03-29 NOTE — ED Triage Notes (Signed)
 Pt to ED via [facility] for alcohol withdraw. Per EMS pt called out to seek treatment before he got too bad. Pt requested PD to transport with EMS because he did not trust him self to not get violent. EMS reports pt requested EMS to strap him to the stretcher. Pt calm during triage but stating that he will get violent. Pt reports wanting to go get treatment for alcohol detox. Pt reports SI but refusing to share plans with this RN. Pt also reports HI and states that he has "people to take care of first."

## 2024-03-29 NOTE — ED Notes (Signed)
 Pt stating to this RN that he is wanting to leave. Pt reporting " This nurse is a child" and "I am leaving because I am in danger here". MD made aware and comes to bedside. MD aware pt wants to leave. MD assessing pt with this RN at this time. Pt able to answer all orientation questions at this time. RN and MD witnessed pt being able to stand and ambulate without assistance with steady gait. Pt stating he does not need to be here. Pt denying SI at this time to this RN and MD. Pt able to answer orientation questions. MD encouraged pt to stay and continue getting treatment. Pt continues to curse at staff and state that staff does not know what they are doing. Pt assessed for any other needs. Pt refusing vital signs. Pt refusing to sign AMA document. Pt stating he has a ride coming to get him. Pt walking down hall to exit at this time. No ride visualized. Pt did not arrive via POV. Pt exited facility on foot. MD aware of pt leaving facility at this time.

## 2024-03-29 NOTE — ED Notes (Signed)
 Pt on the phone stating "I have a fucking child as a nurse". Pt also stating "Only one that cares about me is the security guard and I'm sick of these faggot ass nurses."

## 2024-03-29 NOTE — ED Notes (Signed)
 MD Quale made aware of ETOH 374 at this time.

## 2024-03-29 NOTE — ED Provider Notes (Signed)
 Patient is requesting to be discharged home.  He states that he is not satisfied with the care that he has been receiving.  He states that he feels like he is withdrawing.  His CIWA score has been 0.  Has been here for almost 5 hours but he states that nobody is paying attention to his care.  I discussed with patient that I would be happy to do another CIWA score and to give some Ativan  to help with some relaxation for patient if he is feeling anxious.  Patient declined.  He states that he has someone coming to pick him up.  I asked patient if he was having SI given there was some concern in the initial visit of some SI.  However patient did not have any plan and it sounded vague when pt initially presented.  Patient states that he does not have any SI right now.  He is alert and oriented x 3 although his alcohol level is elevated he is a chronic drinker and has capacity to make these decisions.  He is able to repeat back to me that if he is to go home and not drink that he could have seizures, death.  Patient is ambulatory with a steady gait has no slurred speech he is denying SI he is a chronic alcoholic drinker with multiple visits to the emergency room I have tried to encourage patient to stated by offering Ativan  but I do not feel that I have the ability to IVC patient at this time given his capacity is intact.  Patient is on the phone and I do hear him talking to somebody who is coming to pick him up and security have helped to escort him out to ensure his safety.      Lubertha Rush, MD 03/29/24 Allison Ivory

## 2024-03-29 NOTE — ED Notes (Signed)
 Pt had episode of urine incontinence. Pt changed by this RN and EDT Caitlyn. Pt continues to make comments to staff calling staff children and cursing at staff. Pt belongings placed in belongings bag with pt label. Pt refusing to change into scrub shirt and stated that he was not going to take his shirt off. Pt still has his blue cell phone.  Pt belongings bag includes  1 pair black and white socks 1 pair brown shoes  1 black belt  1 pair of blue pants

## 2024-03-31 ENCOUNTER — Other Ambulatory Visit: Payer: Self-pay

## 2024-03-31 ENCOUNTER — Observation Stay
Admission: EM | Admit: 2024-03-31 | Discharge: 2024-04-01 | Disposition: A | Discharge status: Home / Self Care | Payer: MEDICAID | Attending: Obstetrics and Gynecology | Admitting: Obstetrics and Gynecology

## 2024-03-31 DIAGNOSIS — F10939 Alcohol use, unspecified with withdrawal, unspecified: Secondary | ICD-10-CM | POA: Diagnosis present

## 2024-03-31 DIAGNOSIS — K861 Other chronic pancreatitis: Secondary | ICD-10-CM | POA: Insufficient documentation

## 2024-03-31 DIAGNOSIS — Z21 Asymptomatic human immunodeficiency virus [HIV] infection status: Secondary | ICD-10-CM | POA: Diagnosis not present

## 2024-03-31 DIAGNOSIS — F17210 Nicotine dependence, cigarettes, uncomplicated: Secondary | ICD-10-CM | POA: Insufficient documentation

## 2024-03-31 DIAGNOSIS — F1910 Other psychoactive substance abuse, uncomplicated: Secondary | ICD-10-CM | POA: Diagnosis not present

## 2024-03-31 DIAGNOSIS — K860 Alcohol-induced chronic pancreatitis: Secondary | ICD-10-CM | POA: Diagnosis not present

## 2024-03-31 DIAGNOSIS — D61818 Other pancytopenia: Secondary | ICD-10-CM | POA: Diagnosis not present

## 2024-03-31 DIAGNOSIS — I959 Hypotension, unspecified: Secondary | ICD-10-CM | POA: Insufficient documentation

## 2024-03-31 DIAGNOSIS — Z79899 Other long term (current) drug therapy: Secondary | ICD-10-CM | POA: Diagnosis not present

## 2024-03-31 DIAGNOSIS — F10930 Alcohol use, unspecified with withdrawal, uncomplicated: Principal | ICD-10-CM

## 2024-03-31 DIAGNOSIS — R10811 Right upper quadrant abdominal tenderness: Secondary | ICD-10-CM | POA: Insufficient documentation

## 2024-03-31 DIAGNOSIS — R1013 Epigastric pain: Secondary | ICD-10-CM

## 2024-03-31 DIAGNOSIS — B20 Human immunodeficiency virus [HIV] disease: Secondary | ICD-10-CM | POA: Diagnosis present

## 2024-03-31 LAB — COMPREHENSIVE METABOLIC PANEL WITH GFR
ALT: 22 U/L (ref 0–44)
AST: 29 U/L (ref 15–41)
Albumin: 4.1 g/dL (ref 3.5–5.0)
Alkaline Phosphatase: 57 U/L (ref 38–126)
Anion gap: 13 (ref 5–15)
BUN: 9 mg/dL (ref 6–20)
CO2: 19 mmol/L — ABNORMAL LOW (ref 22–32)
Calcium: 8.6 mg/dL — ABNORMAL LOW (ref 8.9–10.3)
Chloride: 103 mmol/L (ref 98–111)
Creatinine, Ser: 0.52 mg/dL — ABNORMAL LOW (ref 0.61–1.24)
GFR, Estimated: >60 mL/min (ref >60)
Glucose, Bld: 94 mg/dL (ref 70–99)
Potassium: 3.9 mmol/L (ref 3.5–5.1)
Sodium: 135 mmol/L (ref 135–145)
Total Bilirubin: 0.9 mg/dL (ref 0.0–1.2)
Total Protein: 7.2 g/dL (ref 6.5–8.1)

## 2024-03-31 LAB — CBC
HCT: 37 % — ABNORMAL LOW (ref 39.0–52.0)
Hemoglobin: 12.4 g/dL — ABNORMAL LOW (ref 13.0–17.0)
MCH: 30.4 pg (ref 26.0–34.0)
MCHC: 33.5 g/dL (ref 30.0–36.0)
MCV: 90.7 fL (ref 80.0–100.0)
Platelets: 172 10*3/uL (ref 150–400)
RBC: 4.08 MIL/uL — ABNORMAL LOW (ref 4.22–5.81)
RDW: 16 % — ABNORMAL HIGH (ref 11.5–15.5)
WBC: 3.1 10*3/uL — ABNORMAL LOW (ref 4.0–10.5)
nRBC: 0 % (ref 0.0–0.2)

## 2024-03-31 LAB — URINE DRUG SCREEN, QUALITATIVE (ARMC ONLY)
Amphetamines, Ur Screen: NOT DETECTED
Barbiturates, Ur Screen: NOT DETECTED
Benzodiazepine, Ur Scrn: NOT DETECTED
Cannabinoid 50 Ng, Ur ~~LOC~~: NOT DETECTED
Cocaine Metabolite,Ur ~~LOC~~: NOT DETECTED
MDMA (Ecstasy)Ur Screen: NOT DETECTED
Methadone Scn, Ur: NOT DETECTED
Opiate, Ur Screen: NOT DETECTED
Phencyclidine (PCP) Ur S: NOT DETECTED
Tricyclic, Ur Screen: NOT DETECTED

## 2024-03-31 LAB — LIPASE, BLOOD: Lipase: 56 U/L — ABNORMAL HIGH (ref 11–51)

## 2024-03-31 LAB — ETHANOL: Alcohol, Ethyl (B): 115 mg/dL — ABNORMAL HIGH (ref <15)

## 2024-03-31 MED ORDER — CHLORDIAZEPOXIDE HCL 25 MG PO CAPS: 25.0000 mg | ORAL_CAPSULE | Freq: Every day | ORAL | Status: DC

## 2024-03-31 MED ORDER — POLYETHYLENE GLYCOL 3350 17 G PO PACK: 17.0000 g | PACK | Freq: Every day | ORAL | Status: DC | PRN

## 2024-03-31 MED ORDER — ONDANSETRON HCL 4 MG/2ML IJ SOLN: 4.0000 mg | Freq: Four times a day (QID) | INTRAMUSCULAR | Status: DC | PRN

## 2024-03-31 MED ORDER — PHENOBARBITAL SODIUM 65 MG/ML IJ SOLN
130.0000 mg | Freq: Once | INTRAMUSCULAR | Status: AC
  Administered 2024-03-31: 130 mg via INTRAVENOUS
  Filled 2024-03-31: qty 2

## 2024-03-31 MED ORDER — LOPERAMIDE HCL 2 MG PO CAPS: 2.0000 mg | ORAL_CAPSULE | ORAL | Status: DC | PRN

## 2024-03-31 MED ORDER — ENOXAPARIN SODIUM 40 MG/0.4ML IJ SOSY
40.0000 mg | PREFILLED_SYRINGE | INTRAMUSCULAR | Status: DC
  Administered 2024-03-31: 40 mg via SUBCUTANEOUS
  Filled 2024-03-31: qty 0.4

## 2024-03-31 MED ORDER — CHLORDIAZEPOXIDE HCL 25 MG PO CAPS
25.0000 mg | ORAL_CAPSULE | Freq: Three times a day (TID) | ORAL | Status: DC
  Administered 2024-03-31 – 2024-04-01 (×3): 25 mg via ORAL
  Filled 2024-03-31 (×3): qty 1

## 2024-03-31 MED ORDER — CHLORDIAZEPOXIDE HCL 25 MG PO CAPS: 25.0000 mg | ORAL_CAPSULE | Freq: Two times a day (BID) | ORAL | Status: DC

## 2024-03-31 MED ORDER — FOLIC ACID 1 MG PO TABS
1.0000 mg | ORAL_TABLET | Freq: Every day | ORAL | Status: DC
  Administered 2024-03-31 – 2024-04-01 (×2): 1 mg via ORAL
  Filled 2024-03-31 (×2): qty 1

## 2024-03-31 MED ORDER — SODIUM CHLORIDE 0.9% FLUSH
3.0000 mL | Freq: Two times a day (BID) | INTRAVENOUS | Status: DC
  Administered 2024-03-31 – 2024-04-01 (×3): 3 mL via INTRAVENOUS

## 2024-03-31 MED ORDER — ADULT MULTIVITAMIN W/MINERALS CH
1.0000 | ORAL_TABLET | Freq: Every day | ORAL | Status: DC
Start: 2024-03-31 — End: 2024-04-01
  Administered 2024-03-31: 1 via ORAL
  Filled 2024-03-31: qty 1

## 2024-03-31 MED ORDER — THIAMINE HCL 100 MG/ML IJ SOLN
100.0000 mg | Freq: Every day | INTRAMUSCULAR | Status: DC
  Filled 2024-03-31: qty 2

## 2024-03-31 MED ORDER — LACTATED RINGERS IV BOLUS
1000.0000 mL | Freq: Once | INTRAVENOUS | Status: AC
  Administered 2024-03-31: 1000 mL via INTRAVENOUS

## 2024-03-31 MED ORDER — ONDANSETRON HCL 4 MG PO TABS: 4.0000 mg | ORAL_TABLET | Freq: Four times a day (QID) | ORAL | Status: DC | PRN

## 2024-03-31 MED ORDER — THIAMINE MONONITRATE 100 MG PO TABS
100.0000 mg | ORAL_TABLET | Freq: Every day | ORAL | Status: DC
  Administered 2024-03-31 – 2024-04-01 (×2): 100 mg via ORAL
  Filled 2024-03-31 (×2): qty 1

## 2024-03-31 MED ORDER — LORAZEPAM 2 MG/ML IJ SOLN
1.0000 mg | INTRAMUSCULAR | Status: DC | PRN
  Administered 2024-03-31: 2 mg via INTRAVENOUS
  Filled 2024-03-31: qty 1

## 2024-03-31 MED ORDER — ACETAMINOPHEN 650 MG RE SUPP: 650.0000 mg | Freq: Four times a day (QID) | RECTAL | Status: DC | PRN

## 2024-03-31 MED ORDER — ACETAMINOPHEN 325 MG PO TABS: 650.0000 mg | ORAL_TABLET | Freq: Four times a day (QID) | ORAL | Status: DC | PRN

## 2024-03-31 MED ORDER — PHENOBARBITAL SODIUM 130 MG/ML IJ SOLN
130.0000 mg | Freq: Once | INTRAMUSCULAR | Status: AC
  Administered 2024-03-31: 130 mg via INTRAVENOUS
  Filled 2024-03-31: qty 1

## 2024-03-31 MED ORDER — CHLORDIAZEPOXIDE HCL 25 MG PO CAPS: 25.0000 mg | ORAL_CAPSULE | Freq: Three times a day (TID) | ORAL | Status: DC

## 2024-03-31 MED ORDER — PANCRELIPASE (LIP-PROT-AMYL) 12000-38000 UNITS PO CPEP
24000.0000 [IU] | ORAL_CAPSULE | Freq: Three times a day (TID) | ORAL | Status: DC
  Administered 2024-04-01: 24000 [IU] via ORAL
  Filled 2024-03-31 (×2): qty 2

## 2024-03-31 MED ORDER — PANTOPRAZOLE SODIUM 40 MG PO TBEC
40.0000 mg | DELAYED_RELEASE_TABLET | Freq: Two times a day (BID) | ORAL | Status: DC
  Administered 2024-03-31 – 2024-04-01 (×2): 40 mg via ORAL
  Filled 2024-03-31 (×2): qty 1

## 2024-03-31 MED ORDER — LORAZEPAM 1 MG PO TABS: 1.0000 mg | ORAL_TABLET | ORAL | Status: DC | PRN

## 2024-03-31 MED ORDER — BICTEGRAVIR-EMTRICITAB-TENOFOV 50-200-25 MG PO TABS
1.0000 | ORAL_TABLET | Freq: Every day | ORAL | Status: DC
  Administered 2024-03-31 – 2024-04-01 (×2): 1 via ORAL
  Filled 2024-03-31 (×2): qty 1

## 2024-03-31 MED ORDER — QUETIAPINE FUMARATE 25 MG PO TABS
50.0000 mg | ORAL_TABLET | Freq: Two times a day (BID) | ORAL | Status: DC
  Administered 2024-03-31 – 2024-04-01 (×2): 50 mg via ORAL
  Filled 2024-03-31 (×2): qty 2

## 2024-03-31 MED ORDER — DROPERIDOL 2.5 MG/ML IJ SOLN
1.2500 mg | Freq: Once | INTRAMUSCULAR | Status: AC
  Administered 2024-03-31: 1.25 mg via INTRAVENOUS
  Filled 2024-03-31: qty 2

## 2024-03-31 MED ORDER — GABAPENTIN 300 MG PO CAPS
300.0000 mg | ORAL_CAPSULE | Freq: Three times a day (TID) | ORAL | Status: DC
  Administered 2024-03-31 – 2024-04-01 (×2): 300 mg via ORAL
  Filled 2024-03-31 (×2): qty 1

## 2024-03-31 NOTE — ED Notes (Signed)
 This RN attempted IV stick twice. Iv team consulted.

## 2024-03-31 NOTE — Assessment & Plan Note (Signed)
 Patient is presenting in active alcohol withdrawal with desire for cessation.  He received 2 doses of phenobarbital  which made him quite sleepy though but well-controlled his tremors.  - CIWA monitoring - Start Librium  taper - Ativan  as needed per CIWA score - TOC consultation for resources - Daily folic acid , thiamine  and multivitamin

## 2024-03-31 NOTE — H&P (Addendum)
 History and Physical    Patient: Gary [name] AVW:[id number] DOB: February 21, 1986 DOA: 03/31/2024 DOS: the patient was seen and examined on 03/31/2024 PCP: Pcp, No  Patient coming from: Home  Chief Complaint:  Chief Complaint  Patient presents with   detox   HPI: Gary [name] is a 38 y.o. male with medical history significant of polysubstance abuse (alcohol, opioids), HIV, chronic pancreatitis, schizophrenia, pancytopenia, who presents to the ED due to alcohol withdrawal.  Gary [name] states that he is interested in alcohol cessation, with his last drink yesterday in the evening.  Since that time, he has been experiencing increased tremors, anxiety, but has not experienced any nausea, vomiting.  He endorses chronic abdominal pain that is slightly worse compared to prior.  He denies any dizziness, headache.  ED course: On arrival to the ED, Patient was normotensive at 110/71 with heart rate of 77.  He was saturating at 98% on room air.  He was afebrile at 98.3.  Initial workup notable for WBC 3.1, hemoglobin 12.4, platelets 172, creatinine 0.52, GFR above 60.  AST, ALT within normal limits.  Lipase 56.  UDS negative.  Alcohol level 115.  Due to active withdrawals, patient was started on IV fluids, phenobarbital , and droperidol .  TRH contacted for admission.  Review of Systems: As mentioned in the history of present illness. All other systems reviewed and are negative.  Past Medical History:  Diagnosis Date   Alcohol abuse    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   Anxiety    Bipolar 2 disorder (HCC)    HIV (human immunodeficiency virus infection) (HCC)    Pancreatitis    PTSD (post-traumatic stress disorder)    Schizophrenia (HCC)    Seizures (HCC)    Subdural hematoma (HCC)    Past Surgical History:  Procedure Laterality Date   BIOPSY  04/19/2022   Procedure: BIOPSY;  Surgeon: Normie Becton., MD;  Location: [facility] ENDOSCOPY;  Service: Gastroenterology;;   ENTEROSCOPY N/A  04/19/2022   Procedure: ENTEROSCOPY;  Surgeon: Normie Becton., MD;  Location: [facility] ENDOSCOPY;  Service: Gastroenterology;  Laterality: N/A;   INCISION AND DRAINAGE PERIRECTAL ABSCESS N/A 09/24/2016   Procedure: IRRIGATION AND DEBRIDEMENT PERIRECTAL ABSCESS;  Surgeon: Gwyndolyn Lerner, MD;  Location: [facility] ORS;  Service: General;  Laterality: N/A;   none     Social History:  reports that he has been smoking cigarettes. He started smoking about 21 years ago. He has a 10.7 pack-year smoking history. He has never used smokeless tobacco. He reports current alcohol use of about 105.0 standard drinks of alcohol per week. He reports that he does not currently use drugs after having used the following drugs: Methamphetamines and Cocaine.  Allergies  Allergen Reactions   Tegretol  [Carbamazepine ] Other (See Comments)    Vertigo  Paralysis   Caffeine Palpitations    Family History  Problem Relation Age of Onset   Hypertension Mother    Alcohol abuse Mother    Alcoholism Mother    Alcohol abuse Father    Colon cancer Other    Other Other    Cancer Other     Prior to Admission medications   Medication Sig Start Date End Date Taking? Authorizing Provider  bictegravir-emtricitabine -tenofovir  AF (BIKTARVY ) 50-200-25 MG TABS tablet Take 1 tablet by mouth daily. 02/24/24  Yes Althia Atlas, MD  folic acid  (FOLVITE ) 1 MG tablet Take 1 tablet (1 mg total) by mouth daily. 03/06/24  Yes Ezzard Holms, MD  gabapentin  (NEURONTIN ) 300 MG capsule Take 2  capsules (600 mg total) by mouth 3 (three) times daily for 14 days. 02/28/24 03/13/24  Dezii, Alexandra, DO  lipase/protease/amylase 24000-76000 units CPEP Take 1 capsule (24,000 Units total) by mouth 3 (three) times daily before meals. 03/05/24  Yes Ezzard Holms, MD  midodrine  (PROAMATINE ) 10 MG tablet Take 1 tablet (10 mg total) by mouth 3 (three) times daily. 03/06/24   Ezzard Holms, MD  pantoprazole  (PROTONIX ) 40 MG tablet Take 1 tablet (40 mg total) by  mouth 2 (two) times daily. 03/05/24   Ezzard Holms, MD  QUEtiapine  (SEROQUEL ) 50 MG tablet Take 1 tablet (50 mg total) by mouth 2 (two) times daily. 02/24/24   Althia Atlas, MD  thiamine  (VITAMIN B-1) 100 MG tablet Take 1 tablet (100 mg total) by mouth daily. 03/06/24  Yes Ezzard Holms, MD  famotidine  (PEPCID ) 20 MG tablet Take 1 tablet (20 mg total) by mouth 2 (two) times daily. Patient not taking: Reported on 06/01/2019 05/14/19 06/02/19  Mozell Arias, MD    Physical Exam: Vitals:   03/31/24 1121 03/31/24 1213 03/31/24 1224 03/31/24 1532  BP: 107/74  110/71 108/81  Pulse: 85  77 67  Resp: 18  18 17   Temp: 98.4 F (36.9 C)  98.3 F (36.8 C) 98.4 F (36.9 C)  TempSrc:   Oral Oral  SpO2: 98% 99% 98% 100%  Weight: 62.3 kg     Height: 6' (1.829 m)      Physical Exam Vitals and nursing note reviewed.  Constitutional:      Appearance: He is normal weight.  HENT:     Head: Normocephalic and atraumatic.  Cardiovascular:     Rate and Rhythm: Normal rate and regular rhythm.     Heart sounds: No murmur heard.    No gallop.  Pulmonary:     Effort: Pulmonary effort is normal. No respiratory distress.     Breath sounds: Normal breath sounds. No wheezing, rhonchi or rales.  Abdominal:     General: Bowel sounds are normal. There is no distension.     Palpations: Abdomen is soft.     Tenderness: There is abdominal tenderness (RUQ, epigastric). There is no guarding.  Musculoskeletal:     Right lower leg: No edema.     Left lower leg: No edema.  Neurological:     Mental Status: He is oriented to person, place, and time. He is lethargic.     Comments: No tremor  Psychiatric:        Mood and Affect: Mood normal.        Behavior: Behavior normal.    Data Reviewed: CBC with WBC of 3.1, hemoglobin of 12.4, platelets of 172 CMP with sodium of 135, potassium 3.9, bicarb 19, glucose 94, BUN 9, creatinine 0.52, AST 29, ALT 22, GFR above 60 Creatinine 0.52 Lipase 56 UDS negative  EKG  personally reviewed.  Sinus rhythm with rate of 71.  No acute ischemic changes.  There are no new results to review at this time.  Assessment and Plan:  Alcohol withdrawal (HCC) Patient is presenting in active alcohol withdrawal with desire for cessation.  He received 2 doses of phenobarbital  which made him quite sleepy though but well-controlled his tremors.  - CIWA monitoring - Start Librium  taper - Ativan  as needed per CIWA score - TOC consultation for resources - Daily folic acid , thiamine  and multivitamin  Polysubstance abuse (HCC) History of chronic polysubstance abuse including opioids and alcohol.  UDS is negative today.  Chronic pancreatitis (  HCC) Chronic pancreatitis with slight worsening of pain reported today.  Discussed with patient that chronic pancreatitis pain which generally only worsens over time and this is unlikely an acute flare.  - Continue home regimen  Hypotension Previous history of hypotension requiring scheduled midodrine .  He has not been taking this, blood pressures currently at goal.  - Hold off on restarting at this time  Pancytopenia [facility]) History of pancytopenia, now only with anemia and leukopenia.  Stable.  HIV disease (HCC) - Continue home Biktarvy   Advance Care Planning:   Code Status: Full Code   Consults: None  Family Communication: None  Severity of Illness: The appropriate patient status for this patient is OBSERVATION. Observation status is judged to be reasonable and necessary in order to provide the required intensity of service to ensure the patient's safety. The patient's presenting symptoms, physical exam findings, and initial radiographic and laboratory data in the context of their medical condition is felt to place them at decreased risk for further clinical deterioration. Furthermore, it is anticipated that the patient will be medically stable for discharge from the hospital within 2 midnights of admission.   Author: Avi Body, MD 03/31/2024 4:16 PM  For on call review [email or web address].

## 2024-03-31 NOTE — ED Provider Notes (Signed)
 [facility] Provider Note    Event Date/Time   First MD Initiated Contact with Patient 03/31/24 1155     (approximate)   History   detox   HPI  Gary [name] is a 38 y.o. male with history of alcohol abuse, history of withdrawal, history of HIV, schizophrenia, chronic pancreatitis, presenting with alcohol withdrawal.  Patient states that he last drank 2 beers yesterday night.  States that he had been drinking a lot previously.  He is having tremors.  Also been having diarrhea that is nonbloody, nonmelanotic.  Had some nausea vomiting earlier.  States that he may have had some marijuana as well.  No fevers, chest pain, shortness of breath, urinary symptoms.  Also complaining about generalized abdominal pain, thinks it is related to his pancreatitis.  On independent chart review, he has been to the emergency department multiple times for alcohol withdrawal symptoms as well as symptoms related to his pancreatitis.  He was admitted in April, for pancreatitis had a CT that showed acute pancreatitis with interval complex pancreatic pseudocyst, MRI showed acute pancreatitis but no abscess or necrosis.     Physical Exam   Triage Vital Signs: ED Triage Vitals [03/31/24 1121]  Encounter Vitals Group     BP 107/74     Systolic BP Percentile      Diastolic BP Percentile      Pulse Rate 85     Resp 18     Temp 98.4 F (36.9 C)     Temp src      SpO2 98 %     Weight 137 lb 5.6 oz (62.3 kg)     Height 6' (1.829 m)     Head Circumference      Peak Flow      Pain Score 7     Pain Loc      Pain Education      Exclude from Growth Chart     Most recent vital signs: Vitals:   03/31/24 1213 03/31/24 1224  BP:  110/71  Pulse:  77  Resp:  18  Temp:  98.3 F (36.8 C)  SpO2: 99% 98%     General: Awake, no distress.  CV:  Good peripheral perfusion.  Resp:  Normal effort.  Abd:  No distention.  Soft, no guarding, reacted very intensely to light palpation  but no reaction when I was auscultating and putting deeper pressure using a stethoscope.  He did have mild tenderness to the epigastric region. Other:  Tongue fasciculations as well as bilateral hand tremors.   ED Results / Procedures / Treatments   Labs (all labs ordered are listed, but only abnormal results are displayed) Labs Reviewed  COMPREHENSIVE METABOLIC PANEL WITH GFR - Abnormal; Notable for the following components:      Result Value   CO2 19 (*)    Creatinine, Ser 0.52 (*)    Calcium  8.6 (*)    All other components within normal limits  ETHANOL - Abnormal; Notable for the following components:   Alcohol, Ethyl (B) 115 (*)    All other components within normal limits  CBC - Abnormal; Notable for the following components:   WBC 3.1 (*)    RBC 4.08 (*)    Hemoglobin 12.4 (*)    HCT 37.0 (*)    RDW 16.0 (*)    All other components within normal limits  LIPASE, BLOOD - Abnormal; Notable for the following components:   Lipase 56 (*)  All other components within normal limits  URINE DRUG SCREEN, QUALITATIVE ([facility] ONLY)     EKG  EKG shows, sinus rhythm, rate 71, normal QS, normal QTc, no ischemic ST elevation, T wave inversion in aVL, not significantly compared to prior    PROCEDURES:  Critical Care performed: Yes, see critical care procedure note(s)  .Critical Care  Performed by: Shane Darling, MD Authorized by: Shane Darling, MD   Critical care provider statement:    Critical care time (minutes):  40   Critical care was necessary to treat or prevent imminent or life-threatening deterioration of the following conditions:  Toxidrome (Alcohol withdrawal)   Critical care was time spent personally by me on the following activities:  Development of treatment plan with patient or surrogate, discussions with consultants, evaluation of patient's response to treatment, examination of patient, ordering and review of laboratory studies, ordering and review of radiographic  studies, ordering and performing treatments and interventions, pulse oximetry, re-evaluation of patient's condition and review of old charts    MEDICATIONS ORDERED IN ED: Medications  lactated ringers  bolus 1,000 mL (1,000 mLs Intravenous New Bag/Given 03/31/24 1248)  PHENObarbital  (LUMINAL) injection 130 mg (130 mg Intravenous Given 03/31/24 1251)  droperidol  (INAPSINE ) 2.5 MG/ML injection 1.25 mg (1.25 mg Intravenous Given 03/31/24 1327)  PHENObarbital  (LUMINAL) injection 130 mg (130 mg Intravenous Given 03/31/24 1358)     IMPRESSION / MDM / ASSESSMENT AND PLAN / ED COURSE  I reviewed the triage vital signs and the nursing notes.                              Differential diagnosis includes, but is not limited to, alcohol withdrawal, electrolyte derangements, dehydration, pancreatitis, gastritis, GERD, alcohol related gastritis.  Will get labs, EKG, lipase, IV fluids, IV phenobarbital .  Patient's presentation is most consistent with acute presentation with potential threat to life or bodily function.  Independent interpretation of labs and imaging below.  Given his alcohol withdrawal, patient is at high risk will need to be admitted for further management.  Consult to hospitalist is agreeable plan for admission will evaluate the patient.  She had decision-making outpatient and he is agreeable with this plan.  He is admitted.  The patient is on the cardiac monitor to evaluate for evidence of arrhythmia and/or significant heart rate changes.   Clinical Course as of 03/31/24 1358  Fri Mar 31, 2024  1341 Independent review of labs, mild leukopenia, lipase is mildly elevated, ethanol level is 115, electrolytes not severely deranged, LFTs are normal.   [TT]  1342 On reassessment patient still has some tongue fasciculation and bilateral tremors.  Will give him another round of phenobarbital  and plan to have him admitted for further management. [TT]    Clinical Course User Index [TT] Drenda Gentle, Richard Champion, MD     FINAL CLINICAL IMPRESSION(S) / ED DIAGNOSES   Final diagnoses:  Alcohol withdrawal syndrome without complication (HCC)  Epigastric pain     Rx / DC Orders   ED Discharge Orders     None        Note:  This document was prepared using Dragon voice recognition software and may include unintentional dictation errors.    Shane Darling, MD 03/31/24 [phone]

## 2024-03-31 NOTE — Assessment & Plan Note (Signed)
 History of pancytopenia, now only with anemia and leukopenia.  Stable.

## 2024-03-31 NOTE — ED Notes (Signed)
 Lab called for  this pt, unable to get blood.

## 2024-03-31 NOTE — Assessment & Plan Note (Signed)
 Previous history of hypotension requiring scheduled midodrine .  He has not been taking this, blood pressures currently at goal.  - Hold off on restarting at this time

## 2024-03-31 NOTE — Assessment & Plan Note (Signed)
 Continue home Biktarvy.

## 2024-03-31 NOTE — Plan of Care (Signed)
  Problem: Education: Goal: Knowledge of General Education information will improve Description: Including pain rating scale, medication(s)/side effects and non-pharmacologic comfort measures Outcome: Progressing   Problem: Clinical Measurements: Goal: Ability to maintain clinical measurements within normal limits will improve Outcome: Progressing   Problem: Coping: Goal: Level of anxiety will decrease Outcome: Progressing   Problem: Safety: Goal: Ability to remain free from injury will improve Outcome: Progressing   

## 2024-03-31 NOTE — Assessment & Plan Note (Signed)
 Chronic pancreatitis with slight worsening of pain reported today.  Discussed with patient that chronic pancreatitis pain which generally only worsens over time and this is unlikely an acute flare.  - Continue home regimen

## 2024-03-31 NOTE — ED Triage Notes (Signed)
 Pt comes with needing detox from alcohol. Pt states shakes and pain. Pt states last drink sometime yesterday.   Pt normally drinks a lot daily. Pt states liquor beer and wine. Pt states he drinks as much as he can. Pt has noticeable tremors. Pt has chronic pancreatitis.

## 2024-03-31 NOTE — Progress Notes (Signed)
   03/31/24 1532  Vitals  Temp 98.4 F (36.9 C)  Temp Source Oral  BP 108/81  MAP (mmHg) 91  BP Location Left Arm  BP Method Automatic  Patient Position (if appropriate) Lying  Pulse Rate 67  Pulse Rate Source Monitor  Resp 17  MEWS COLOR  MEWS Score Color Green  Oxygen Therapy  SpO2 100 %  O2 Device Room Air  MEWS Score  MEWS Temp 0  MEWS Systolic 0  MEWS Pulse 0  MEWS RR 0  MEWS LOC 0  MEWS Score 0   Patient admitted from ED via wheelchair alert on room air; head to toe and skin assessment completed.  Safety precautions in place with call bell education provided within hand reach.  Plan of care continues.

## 2024-03-31 NOTE — Assessment & Plan Note (Signed)
 History of chronic polysubstance abuse including opioids and alcohol.  UDS is negative today.

## 2024-04-01 DIAGNOSIS — F10930 Alcohol use, unspecified with withdrawal, uncomplicated: Secondary | ICD-10-CM | POA: Diagnosis not present

## 2024-04-01 LAB — COMPREHENSIVE METABOLIC PANEL WITH GFR
ALT: 17 U/L (ref 0–44)
AST: 21 U/L (ref 15–41)
Albumin: 3.6 g/dL (ref 3.5–5.0)
Alkaline Phosphatase: 46 U/L (ref 38–126)
Anion gap: 8 (ref 5–15)
BUN: 11 mg/dL (ref 6–20)
CO2: 23 mmol/L (ref 22–32)
Calcium: 8.8 mg/dL — ABNORMAL LOW (ref 8.9–10.3)
Chloride: 102 mmol/L (ref 98–111)
Creatinine, Ser: 0.55 mg/dL — ABNORMAL LOW (ref 0.61–1.24)
GFR, Estimated: >60 mL/min (ref >60)
Glucose, Bld: 85 mg/dL (ref 70–99)
Potassium: 3.8 mmol/L (ref 3.5–5.1)
Sodium: 133 mmol/L — ABNORMAL LOW (ref 135–145)
Total Bilirubin: 1.5 mg/dL — ABNORMAL HIGH (ref 0.0–1.2)
Total Protein: 6.1 g/dL — ABNORMAL LOW (ref 6.5–8.1)

## 2024-04-01 MED ORDER — CHLORDIAZEPOXIDE HCL 25 MG PO CAPS: ORAL_CAPSULE | ORAL | 0 refills | Status: DC

## 2024-04-01 NOTE — Progress Notes (Signed)
 Patient was given verbal and written discharge instructions, acknowledge understanding and states he will comply. Patient walked himself out, no distress when leaving the floor.

## 2024-04-01 NOTE — Plan of Care (Signed)
  Problem: Health Behavior/Discharge Planning: Goal: Ability to manage health-related needs will improve Outcome: Progressing   Problem: Activity: Goal: Risk for activity intolerance will decrease Outcome: Progressing   Problem: Nutrition: Goal: Adequate nutrition will be maintained Outcome: Progressing   Problem: Coping: Goal: Level of anxiety will decrease Outcome: Progressing   Problem: Elimination: Goal: Will not experience complications related to bowel motility Outcome: Progressing Goal: Will not experience complications related to urinary retention Outcome: Progressing   Problem: Pain Managment: Goal: General experience of comfort will improve and/or be controlled Outcome: Progressing   Problem: Safety: Goal: Ability to remain free from injury will improve Outcome: Progressing   Problem: Skin Integrity: Goal: Risk for impaired skin integrity will decrease Outcome: Progressing

## 2024-04-01 NOTE — Plan of Care (Signed)
   Problem: Education: Goal: Knowledge of General Education information will improve Description: Including pain rating scale, medication(s)/side effects and non-pharmacologic comfort measures Outcome: Progressing   Problem: Nutrition: Goal: Adequate nutrition will be maintained Outcome: Progressing   Problem: Coping: Goal: Level of anxiety will decrease Outcome: Progressing   Problem: Pain Managment: Goal: General experience of comfort will improve and/or be controlled Outcome: Progressing

## 2024-04-01 NOTE — Discharge Summary (Signed)
 Gary [name]:[id number] DOB: September 15, 1986 DOA: 03/31/2024  PCP: Pcp, No  Admit date: 03/31/2024 Discharge date: 04/01/2024  Time spent: 35 minutes  Recommendations for Outpatient Follow-up:  Close pcp f/u     Discharge Diagnoses:  Active Problems:   Alcohol withdrawal (HCC)   Polysubstance abuse (HCC)   Chronic pancreatitis (HCC)   HIV disease (HCC)   Pancytopenia (HCC)   Hypotension   Discharge Condition: stable  Diet recommendation: heart healthy  Filed Weights   03/31/24 1121  Weight: 62.3 kg    History of present illness:  From admission h and p Gary [name] is a 38 y.o. male with medical history significant of polysubstance abuse (alcohol, opioids), HIV, chronic pancreatitis, schizophrenia, pancytopenia, who presents to the ED due to alcohol withdrawal.   Gary [name] states that he is interested in alcohol cessation, with his last drink yesterday in the evening.  Since that time, he has been experiencing increased tremors, anxiety, but has not experienced any nausea, vomiting.  He endorses chronic abdominal pain that is slightly worse compared to prior.  He denies any dizziness, headache.    Hospital Course:  Patient admitted for alcohol withdrawal. On day of admission he is not showing signs of alcohol withdrawal and has not required prn use of ativan . His chronic pancreatitis is stable, not complaining of significant abdominal pain. Labs and vital signs are stable. Shared decision to discharge with librium  taper.   Procedures: none   Consultations: none  Discharge Exam: Vitals:   04/01/24 0516 04/01/24 0736  BP: (!) 88/61 101/69  Pulse: (!) 56 60  Resp: 16 18  Temp: 98.1 F (36.7 C) 97.8 F (36.6 C)  SpO2: 100% 99%    General: NAD Cardiovascular: RRR Respiratory: CTAB Neuro: calm, no tremor, no diaphoresis, aao x4  Discharge Instructions   Discharge Instructions     Diet - low sodium heart healthy   Complete by: As directed    Diet -  low sodium heart healthy   Complete by: As directed    Increase activity slowly   Complete by: As directed    Increase activity slowly   Complete by: As directed       Allergies as of 04/01/2024       Reactions   Tegretol  [carbamazepine ] Other (See Comments)   Vertigo  Paralysis   Caffeine Palpitations        Medication List     TAKE these medications    Biktarvy  50-200-25 MG Tabs tablet Generic drug: bictegravir-emtricitabine -tenofovir  AF Take 1 tablet by mouth daily.   chlordiazePOXIDE  25 MG capsule Commonly known as: LIBRIUM  Take 2 tabs by mouth every 8 hours the first day, every 12 hours the second day, and once on the third day   folic acid  1 MG tablet Commonly known as: FOLVITE  Take 1 tablet (1 mg total) by mouth daily.   gabapentin  300 MG capsule Commonly known as: NEURONTIN  Take 2 capsules (600 mg total) by mouth 3 (three) times daily for 14 days.   midodrine  10 MG tablet Commonly known as: PROAMATINE  Take 1 tablet (10 mg total) by mouth 3 (three) times daily.   Pancrelipase  (Lip-Prot-Amyl) 24000-76000 units Cpep Take 1 capsule (24,000 Units total) by mouth 3 (three) times daily before meals.   pantoprazole  40 MG tablet Commonly known as: PROTONIX  Take 1 tablet (40 mg total) by mouth 2 (two) times daily.   QUEtiapine  50 MG tablet Commonly known as: SEROQUEL  Take 1 tablet (50 mg total) by mouth  2 (two) times daily.   thiamine  100 MG tablet Commonly known as: VITAMIN B1 Take 1 tablet (100 mg total) by mouth daily.       Allergies  Allergen Reactions   Tegretol  [Carbamazepine ] Other (See Comments)    Vertigo  Paralysis   Caffeine Palpitations      The results of significant diagnostics from this hospitalization (including imaging, microbiology, ancillary and laboratory) are listed below for reference.    Significant Diagnostic Studies: No results found.  Microbiology: No results found for this or any previous visit (from the past 240  hours).   Labs: Basic Metabolic Panel: Recent Labs  Lab 03/29/24 1402 03/31/24 1136 04/01/24 0524  NA 142 135 133*  K 3.6 3.9 3.8  CL 109 103 102  CO2 23 19* 23  GLUCOSE 94 94 85  BUN 9 9 11   CREATININE 0.58* 0.52* 0.55*  CALCIUM  8.8* 8.6* 8.8*   Liver Function Tests: Recent Labs  Lab 03/29/24 1402 03/31/24 1136 04/01/24 0524  AST 28 29 21   ALT 17 22 17   ALKPHOS 47 57 46  BILITOT 0.7 0.9 1.5*  PROT 6.9 7.2 6.1*  ALBUMIN 3.9 4.1 3.6   Recent Labs  Lab 03/29/24 1402 03/31/24 1136  LIPASE 61* 56*   No results for input(s): "AMMONIA" in the last 168 hours. CBC: Recent Labs  Lab 03/29/24 1402 03/31/24 1136  WBC 3.6* 3.1*  HGB 10.4* 12.4*  HCT 32.0* 37.0*  MCV 92.0 90.7  PLT 156 172   Cardiac Enzymes: No results for input(s): "CKTOTAL", "CKMB", "CKMBINDEX", "TROPONINI" in the last 168 hours. BNP: BNP (last 3 results) Recent Labs    12/08/23 0452  BNP 78.3    ProBNP (last 3 results) No results for input(s): "PROBNP" in the last 8760 hours.  CBG: No results for input(s): "GLUCAP" in the last 168 hours.     Signed:  Raymonde Calico MD.  [facility] 04/01/2024, 8:30 AM

## 2024-04-16 ENCOUNTER — Emergency Department (HOSPITAL_COMMUNITY)
Admission: EM | Admit: 2024-04-16 | Discharge: 2024-04-17 | Disposition: A | Discharge status: Other Acute Inpatient Hospital | Payer: MEDICAID | Attending: Emergency Medicine | Admitting: Emergency Medicine

## 2024-04-16 ENCOUNTER — Other Ambulatory Visit: Payer: Self-pay

## 2024-04-16 ENCOUNTER — Emergency Department (HOSPITAL_COMMUNITY): Payer: MEDICAID

## 2024-04-16 ENCOUNTER — Encounter (HOSPITAL_COMMUNITY): Payer: Self-pay

## 2024-04-16 DIAGNOSIS — R45851 Suicidal ideations: Secondary | ICD-10-CM | POA: Insufficient documentation

## 2024-04-16 DIAGNOSIS — F1010 Alcohol abuse, uncomplicated: Secondary | ICD-10-CM

## 2024-04-16 DIAGNOSIS — F332 Major depressive disorder, recurrent severe without psychotic features: Secondary | ICD-10-CM | POA: Diagnosis present

## 2024-04-16 DIAGNOSIS — M7989 Other specified soft tissue disorders: Secondary | ICD-10-CM | POA: Insufficient documentation

## 2024-04-16 DIAGNOSIS — Y908 Blood alcohol level of 240 mg/100 ml or more: Secondary | ICD-10-CM | POA: Insufficient documentation

## 2024-04-16 DIAGNOSIS — F1020 Alcohol dependence, uncomplicated: Secondary | ICD-10-CM | POA: Diagnosis not present

## 2024-04-16 DIAGNOSIS — R1013 Epigastric pain: Secondary | ICD-10-CM | POA: Diagnosis not present

## 2024-04-16 DIAGNOSIS — Z21 Asymptomatic human immunodeficiency virus [HIV] infection status: Secondary | ICD-10-CM | POA: Insufficient documentation

## 2024-04-16 LAB — COMPREHENSIVE METABOLIC PANEL WITH GFR
ALT: 77 U/L — ABNORMAL HIGH (ref 0–44)
AST: 80 U/L — ABNORMAL HIGH (ref 15–41)
Albumin: 3.8 g/dL (ref 3.5–5.0)
Alkaline Phosphatase: 51 U/L (ref 38–126)
Anion gap: 12 (ref 5–15)
BUN: 15 mg/dL (ref 6–20)
CO2: 24 mmol/L (ref 22–32)
Calcium: 8.3 mg/dL — ABNORMAL LOW (ref 8.9–10.3)
Chloride: 105 mmol/L (ref 98–111)
Creatinine, Ser: 0.5 mg/dL — ABNORMAL LOW (ref 0.61–1.24)
GFR, Estimated: >60 mL/min (ref >60)
Glucose, Bld: 90 mg/dL (ref 70–99)
Potassium: 3.7 mmol/L (ref 3.5–5.1)
Sodium: 141 mmol/L (ref 135–145)
Total Bilirubin: 0.7 mg/dL (ref 0.0–1.2)
Total Protein: 7 g/dL (ref 6.5–8.1)

## 2024-04-16 LAB — CBC WITH DIFFERENTIAL/PLATELET
Abs Immature Granulocytes: 0 10*3/uL (ref 0.00–0.07)
Basophils Absolute: 0.1 10*3/uL (ref 0.0–0.1)
Basophils Relative: 1 %
Eosinophils Absolute: 0.1 10*3/uL (ref 0.0–0.5)
Eosinophils Relative: 2 %
HCT: 31.7 % — ABNORMAL LOW (ref 39.0–52.0)
Hemoglobin: 10.5 g/dL — ABNORMAL LOW (ref 13.0–17.0)
Immature Granulocytes: 0 %
Lymphocytes Relative: 47 %
Lymphs Abs: 1.8 10*3/uL (ref 0.7–4.0)
MCH: 31.1 pg (ref 26.0–34.0)
MCHC: 33.1 g/dL (ref 30.0–36.0)
MCV: 93.8 fL (ref 80.0–100.0)
Monocytes Absolute: 0.3 10*3/uL (ref 0.1–1.0)
Monocytes Relative: 7 %
Neutro Abs: 1.6 10*3/uL — ABNORMAL LOW (ref 1.7–7.7)
Neutrophils Relative %: 43 %
Platelets: 139 10*3/uL — ABNORMAL LOW (ref 150–400)
RBC: 3.38 MIL/uL — ABNORMAL LOW (ref 4.22–5.81)
RDW: 18.4 % — ABNORMAL HIGH (ref 11.5–15.5)
WBC: 3.8 10*3/uL — ABNORMAL LOW (ref 4.0–10.5)
nRBC: 0 % (ref 0.0–0.2)

## 2024-04-16 LAB — LIPASE, BLOOD: Lipase: 49 U/L (ref 11–51)

## 2024-04-16 LAB — ETHANOL: Alcohol, Ethyl (B): 402 mg/dL (ref <15)

## 2024-04-16 LAB — ACETAMINOPHEN LEVEL: Acetaminophen (Tylenol), Serum: <10 ug/mL — ABNORMAL LOW (ref 10–30)

## 2024-04-16 LAB — SALICYLATE LEVEL: Salicylate Lvl: <7 mg/dL — ABNORMAL LOW (ref 7.0–30.0)

## 2024-04-16 MED ORDER — FOLIC ACID 1 MG PO TABS
1.0000 mg | ORAL_TABLET | Freq: Every day | ORAL | Status: DC
  Administered 2024-04-16 – 2024-04-17 (×2): 1 mg via ORAL
  Filled 2024-04-16 (×2): qty 1

## 2024-04-16 MED ORDER — PANTOPRAZOLE SODIUM 40 MG PO TBEC
40.0000 mg | DELAYED_RELEASE_TABLET | Freq: Two times a day (BID) | ORAL | Status: DC
  Administered 2024-04-16 – 2024-04-17 (×2): 40 mg via ORAL
  Filled 2024-04-16 (×2): qty 1

## 2024-04-16 MED ORDER — LORAZEPAM 1 MG PO TABS
1.0000 mg | ORAL_TABLET | ORAL | Status: DC | PRN
  Administered 2024-04-16 – 2024-04-17 (×3): 2 mg via ORAL
  Administered 2024-04-17: 1 mg via ORAL
  Administered 2024-04-17 (×2): 2 mg via ORAL
  Administered 2024-04-17: 1 mg via ORAL
  Filled 2024-04-16: qty 1
  Filled 2024-04-16 (×5): qty 2
  Filled 2024-04-16: qty 1

## 2024-04-16 MED ORDER — THIAMINE HCL 100 MG/ML IJ SOLN: 100.0000 mg | Freq: Every day | INTRAMUSCULAR | Status: DC

## 2024-04-16 MED ORDER — BICTEGRAVIR-EMTRICITAB-TENOFOV 50-200-25 MG PO TABS
1.0000 | ORAL_TABLET | Freq: Every day | ORAL | Status: DC
  Administered 2024-04-16 – 2024-04-17 (×2): 1 via ORAL
  Filled 2024-04-16 (×2): qty 1

## 2024-04-16 MED ORDER — LORAZEPAM 2 MG/ML IJ SOLN
1.0000 mg | INTRAMUSCULAR | Status: DC | PRN
  Filled 2024-04-16 (×2): qty 1

## 2024-04-16 MED ORDER — ADULT MULTIVITAMIN W/MINERALS CH
1.0000 | ORAL_TABLET | Freq: Every day | ORAL | Status: DC
  Administered 2024-04-16 – 2024-04-17 (×2): 1 via ORAL
  Filled 2024-04-16 (×2): qty 1

## 2024-04-16 MED ORDER — QUETIAPINE FUMARATE 50 MG PO TABS
50.0000 mg | ORAL_TABLET | Freq: Two times a day (BID) | ORAL | Status: DC
  Administered 2024-04-16 – 2024-04-17 (×2): 50 mg via ORAL
  Filled 2024-04-16 (×2): qty 1

## 2024-04-16 MED ORDER — PHENOBARBITAL 32.4 MG PO TABS: 64.8000 mg | ORAL_TABLET | Freq: Once | ORAL | Status: DC

## 2024-04-16 MED ORDER — DOXYCYCLINE HYCLATE 100 MG PO TABS
100.0000 mg | ORAL_TABLET | Freq: Two times a day (BID) | ORAL | Status: DC
  Administered 2024-04-16 – 2024-04-17 (×2): 100 mg via ORAL
  Filled 2024-04-16 (×2): qty 1

## 2024-04-16 MED ORDER — THIAMINE MONONITRATE 100 MG PO TABS
100.0000 mg | ORAL_TABLET | Freq: Every day | ORAL | Status: DC
  Administered 2024-04-16 – 2024-04-17 (×2): 100 mg via ORAL
  Filled 2024-04-16 (×2): qty 1

## 2024-04-16 MED ORDER — PHENOBARBITAL 32.4 MG PO TABS
64.8000 mg | ORAL_TABLET | Freq: Once | ORAL | Status: AC
  Administered 2024-04-16: 64.8 mg via ORAL
  Filled 2024-04-16: qty 2

## 2024-04-16 NOTE — ED Provider Notes (Signed)
 I, Evelena Hines, assumed care for this patient.  Brief 38 year old male history of alcohol abuse, HIV, intermittently on antiretroviral medications, recurrent major depressive disorder.  Here today for suicidal ideation, homicidal ideation.  Patient intoxicated, ethanol level was greater than 400.  Patient with history of alcohol withdrawal, started him on some phenobarbital  hopefully ward off those symptoms.  Patient with some swelling of the right hand, negative plain films.  Could be some cellulitis.  Started patient on doxycycline .  Patient medically cleared, referred to TTS.  CIWA ordered.  Advise oncoming doctor to be on look out for alcohol withdrawal.   Afton Horse T, DO 04/16/24 2038

## 2024-04-16 NOTE — BH Assessment (Signed)
 [facility] provider attempted to see pt. Per Dylan, paramedic, pt is currently asleep and unable to engage due to receiving ativan  earlier for agitation. Dylan, paramedic, will alert [facility] provider via secure chart when pt is awake and alert for an assessment.

## 2024-04-16 NOTE — ED Triage Notes (Addendum)
 Patient BIB [facility] voluntary. Patient said he "wants to hurt everyone with his hands." Patient said he feels suicidal and said "dont ask me about my plan." Patient drank vodka and wine "all day today."

## 2024-04-16 NOTE — ED Provider Notes (Signed)
 [place] EMERGENCY DEPARTMENT AT [facility] Provider Note   CSN: [id number] Arrival date & time: 04/16/24  1408     History  Chief Complaint  Patient presents with   Suicidal    Gary [name] is a 38 y.o. male who presents to the Emergency Department with a chief complaint of suicidal ideations with plan.  Patient was brought into the emergency department voluntarily by [facility] police department.  Patient stated "wants to hurt everyone with his hands".  Patient states that he feels suicidal and has a plan to kill himself with guns or knives.  Patient also states that he drank vodka and wine "all day today".  Patient states that he has visual and auditory hallucinations of a man telling him that he is not good enough and cannot do things for himself.  Patient has a past history significant for HIV, polysubstance abuse, alcohol use disorder severe, severe episode of recurrent major depressive disorder, delirium tremens, alcohol induced pancreatitis, seizure disorder, chronic pancreatitis, anxiety, PTSD.  HPI     Home Medications Prior to Admission medications   Medication Sig Start Date End Date Taking? Authorizing Provider  bictegravir-emtricitabine -tenofovir  AF (BIKTARVY ) 50-200-25 MG TABS tablet Take 1 tablet by mouth daily. 02/24/24   Althia Atlas, MD  chlordiazePOXIDE  (LIBRIUM ) 25 MG capsule Take 2 tabs by mouth every 8 hours the first day, every 12 hours the second day, and once on the third day 04/01/24   Wouk, Haynes Lips, MD  folic acid  (FOLVITE ) 1 MG tablet Take 1 tablet (1 mg total) by mouth daily. 03/06/24   Ezzard Holms, MD  gabapentin  (NEURONTIN ) 300 MG capsule Take 2 capsules (600 mg total) by mouth 3 (three) times daily for 14 days. 02/28/24 03/13/24  Dezii, Alexandra, DO  lipase/protease/amylase 24000-76000 units CPEP Take 1 capsule (24,000 Units total) by mouth 3 (three) times daily before meals. 03/05/24   Ezzard Holms, MD  midodrine  (PROAMATINE ) 10 MG  tablet Take 1 tablet (10 mg total) by mouth 3 (three) times daily. Patient not taking: Reported on 03/31/2024 03/06/24   Ezzard Holms, MD  pantoprazole  (PROTONIX ) 40 MG tablet Take 1 tablet (40 mg total) by mouth 2 (two) times daily. 03/05/24   Ezzard Holms, MD  QUEtiapine  (SEROQUEL ) 50 MG tablet Take 1 tablet (50 mg total) by mouth 2 (two) times daily. 02/24/24   Althia Atlas, MD  thiamine  (VITAMIN B-1) 100 MG tablet Take 1 tablet (100 mg total) by mouth daily. 03/06/24   Ezzard Holms, MD  famotidine  (PEPCID ) 20 MG tablet Take 1 tablet (20 mg total) by mouth 2 (two) times daily. Patient not taking: Reported on 06/01/2019 05/14/19 06/02/19  Mozell Arias, MD      Allergies    Tegretol  [carbamazepine ] and Caffeine    Review of Systems   Review of Systems Limited review of systems able to be completed due to patient stay at time of exam in the emergency department.  Patient clearly intoxicated, only answering certain questions.  Patient does state that he is experiencing some abdominal pain as well as right hand pain.  Physical Exam Updated Vital Signs BP 103/83 (BP Location: Right Arm)   Pulse (!) 110   Temp 98.6 F (37 C) (Oral)   Resp (!) 22   Ht 6' (1.829 m)   Wt 62 kg   SpO2 98%   BMI 18.54 kg/m  Physical Exam Vitals and nursing note reviewed.  Constitutional:      Comments:  Patient originally sleeping and clearly intoxicated at time of ED arrival, on reassessment patient is extremely anxious, concerned about having a seizure as well as individuals trying to hurt him.  HENT:     Head: Normocephalic and atraumatic.  Eyes:     Extraocular Movements: Extraocular movements intact.  Cardiovascular:     Rate and Rhythm: Normal rate and regular rhythm.  Pulmonary:     Effort: Pulmonary effort is normal.  Abdominal:     General: Abdomen is flat.     Palpations: Abdomen is soft.     Tenderness: There is abdominal tenderness. There is no guarding or rebound.     Comments:  Epigastric abdominal tenderness  Musculoskeletal:     Comments: Swelling of right hand present, range of motion's of fingers intact, limited range of motion of wrist, physical exam limited due to patient state at time of exam  Skin:    General: Skin is warm and dry.     Capillary Refill: Capillary refill takes less than 2 seconds.  Neurological:     General: No focal deficit present.     Mental Status: He is alert and oriented to person, place, and time.     Motor: No weakness.  Psychiatric:        Behavior: Behavior is cooperative.     Comments: Patient extremely anxious at time of exam, so anxious that he is jumpy with any quick movement.  States that he was surrounded by people who are attempting to hurt him, and had to escape.     ED Results / Procedures / Treatments   Labs (all labs ordered are listed, but only abnormal results are displayed) Labs Reviewed  COMPREHENSIVE METABOLIC PANEL WITH GFR - Abnormal; Notable for the following components:      Result Value   Creatinine, Ser 0.50 (*)    Calcium  8.3 (*)    AST 80 (*)    ALT 77 (*)    All other components within normal limits  ETHANOL - Abnormal; Notable for the following components:   Alcohol, Ethyl (B) 402 (*)    All other components within normal limits  CBC WITH DIFFERENTIAL/PLATELET - Abnormal; Notable for the following components:   WBC 3.8 (*)    RBC 3.38 (*)    Hemoglobin 10.5 (*)    HCT 31.7 (*)    RDW 18.4 (*)    Platelets 139 (*)    Neutro Abs 1.6 (*)    All other components within normal limits  SALICYLATE LEVEL - Abnormal; Notable for the following components:   Salicylate Lvl <7.0 (*)    All other components within normal limits  ACETAMINOPHEN  LEVEL - Abnormal; Notable for the following components:   Acetaminophen  (Tylenol ), Serum <10 (*)    All other components within normal limits  LIPASE, BLOOD  RAPID URINE DRUG SCREEN, HOSP PERFORMED    EKG None  Radiology DG Hand Complete Right Result  Date: 04/16/2024 CLINICAL DATA:  Right hand pain and swelling EXAM: RIGHT HAND - COMPLETE 3+ VIEW COMPARISON:  None Available. FINDINGS: No acute fracture or dislocation is noted. Mild soft tissue swelling is noted. No radiopaque foreign body is noted. IMPRESSION: Soft tissue swelling without acute bony abnormality. Electronically Signed   By: Violeta Grey M.D.   On: 04/16/2024 20:21    Procedures Procedures    Medications Ordered in ED Medications  LORazepam  (ATIVAN ) tablet 1-4 mg (2 mg Oral Given 04/16/24 1846)    Or  LORazepam  (ATIVAN ) injection  1-4 mg ( Intravenous See Alternative 04/16/24 1846)  thiamine  (VITAMIN B1) tablet 100 mg (100 mg Oral Given 04/16/24 1728)    Or  thiamine  (VITAMIN B1) injection 100 mg ( Intravenous See Alternative 04/16/24 1728)  folic acid  (FOLVITE ) tablet 1 mg (1 mg Oral Given 04/16/24 1727)  multivitamin with minerals tablet 1 tablet (1 tablet Oral Given 04/16/24 1727)  PHENobarbital  (LUMINAL) tablet 64.8 mg (64.8 mg Oral Given 04/16/24 1941)    ED Course/ Medical Decision Making/ A&P    Patient presents to the ED for concern of suicidal ideation with plan, this involves an extensive number of treatment options, and is a complaint that carries with it a high risk of complications and morbidity.  The differential diagnosis includes suicide, homicide, psychiatric diagnosis, etc.   Co morbidities that complicate the patient evaluation  Extensive substance abuse history including alcohol withdrawal with delirium tremens and seizure, chronic pancreatitis   Lab Tests:  I Ordered, and personally interpreted labs.  The pertinent results include: CBC-white blood cell count 3.8, hemoglobin 10.5, CMP-creatinine 0.5, AST 80, ALT 77, lipase 49, ethanol level 402   Imaging Studies ordered:  I ordered imaging studies including x-ray right hand I independently visualized and interpreted imaging which showed soft tissue swelling without acute bony abnormality I agree with the  radiologist interpretation   Medicines ordered and prescription drug management:  I ordered medication including CIWA protocol including Ativan  for possible withdrawal, phenobarbital  for alcohol withdrawal prevention Reevaluation of the patient after these medicines showed that the patient improved I have reviewed the patients home medicines and have made adjustments as needed   Test Considered:  CT abdomen pelvis: Based off lab work today, no indication for CT scan of abdomen, patient is already diagnosed with chronic pancreatitis, no elevated lipase today, labs overall reassuring for abdominal pathology   Critical Interventions:  CIWA protocol and medication ministration to prevent possible alcohol withdrawal including seizures   Problem List / ED Course:  Patient brought into the emergency department voluntarily by [facility] please department, chief complaint of suicidal ideation with plan Patient states that he has a plan to commit suicide with knives or a gun, also states that he wants to hurt 2 individuals "Raylene Calamity and Mylinda Asa".  Patient will not tell me who these individuals are Patient has extensive past medical history significant for alcohol withdrawal including delirium tremens and seizures, chronic pancreatitis, HIV, substance abuse IVC initiated due to active suicidal plan Plan to medically clear and consult TTS Ethanol level of 402, CIWA protocol initiated to prevent withdrawal and possible seizure, phenobarbital  administered as well Patient seen in coordination with attending Afton Horse, DO who assumes care of this patient at time of my signout UDS pending at time of signout Plan to consult TTS for psychiatric evaluation   Reevaluation:  After the interventions noted above, I reevaluated the patient and found that they have :improved   Social Determinants of Health:  Substance abuse   Dispostion:  After consideration of the diagnostic results and the  patients response to treatment, I feel that the patent would benefit from psychiatric evaluation after medical clearance.  Patient signed out to attending Afton Horse, DO  Click here for ABCD2, HEART and other calculatorsREFRESH Note before signing :1}                              Medical Decision Making Amount and/or Complexity of Data Reviewed Labs: ordered. Radiology: ordered.  Risk OTC drugs. Prescription drug management.          Final Clinical Impression(s) / ED Diagnoses Final diagnoses:  Suicidal ideation  Alcohol abuse    Rx / DC Orders ED Discharge Orders     None         Fonda Hymen, PA-C 04/16/24 2032    Afton Horse T, DO 04/16/24 2236

## 2024-04-16 NOTE — BH Assessment (Signed)
 Patient was deferred to [facility]IS for a telepsych assessment. The assigned care coordinator will provide updates regarding the scheduling of the assessment. IRIS coordinator can be reached at [phone] for further information on the timing of the telepsych evaluation.

## 2024-04-16 NOTE — ED Notes (Addendum)
 TTS informed they are ready for assessment, however pt has received ativan  per CIWA and phenobarbital  reference MAR, for agitation and restlessness, informed TOC of this. Will notify them when pt is awake and alert. Pt is currently resting, care ongoing.

## 2024-04-17 ENCOUNTER — Inpatient Hospital Stay (HOSPITAL_COMMUNITY)
Admission: AD | Admit: 2024-04-17 | Discharge: 2024-04-25 | DRG: 885 | Disposition: A | Discharge status: Home / Self Care | Payer: MEDICAID | Source: Intra-hospital | Attending: Psychiatry | Admitting: Psychiatry

## 2024-04-17 DIAGNOSIS — F10239 Alcohol dependence with withdrawal, unspecified: Secondary | ICD-10-CM | POA: Diagnosis present

## 2024-04-17 DIAGNOSIS — F419 Anxiety disorder, unspecified: Secondary | ICD-10-CM | POA: Diagnosis present

## 2024-04-17 DIAGNOSIS — Z5982 Transportation insecurity: Secondary | ICD-10-CM | POA: Diagnosis not present

## 2024-04-17 DIAGNOSIS — Z21 Asymptomatic human immunodeficiency virus [HIV] infection status: Secondary | ICD-10-CM | POA: Diagnosis present

## 2024-04-17 DIAGNOSIS — F4310 Post-traumatic stress disorder, unspecified: Secondary | ICD-10-CM | POA: Diagnosis present

## 2024-04-17 DIAGNOSIS — F1024 Alcohol dependence with alcohol-induced mood disorder: Secondary | ICD-10-CM | POA: Diagnosis present

## 2024-04-17 DIAGNOSIS — F1020 Alcohol dependence, uncomplicated: Secondary | ICD-10-CM

## 2024-04-17 DIAGNOSIS — Z5948 Other specified lack of adequate food: Secondary | ICD-10-CM

## 2024-04-17 DIAGNOSIS — F3181 Bipolar II disorder: Secondary | ICD-10-CM | POA: Diagnosis present

## 2024-04-17 DIAGNOSIS — Z800 Family history of malignant neoplasm of digestive organs: Secondary | ICD-10-CM

## 2024-04-17 DIAGNOSIS — F23 Brief psychotic disorder: Secondary | ICD-10-CM | POA: Diagnosis present

## 2024-04-17 DIAGNOSIS — Z888 Allergy status to other drugs, medicaments and biological substances status: Secondary | ICD-10-CM

## 2024-04-17 DIAGNOSIS — Z811 Family history of alcohol abuse and dependence: Secondary | ICD-10-CM

## 2024-04-17 DIAGNOSIS — F332 Major depressive disorder, recurrent severe without psychotic features: Secondary | ICD-10-CM | POA: Diagnosis not present

## 2024-04-17 DIAGNOSIS — K869 Disease of pancreas, unspecified: Secondary | ICD-10-CM | POA: Diagnosis present

## 2024-04-17 DIAGNOSIS — L03113 Cellulitis of right upper limb: Secondary | ICD-10-CM | POA: Diagnosis present

## 2024-04-17 DIAGNOSIS — Z8249 Family history of ischemic heart disease and other diseases of the circulatory system: Secondary | ICD-10-CM

## 2024-04-17 DIAGNOSIS — B20 Human immunodeficiency virus [HIV] disease: Secondary | ICD-10-CM | POA: Diagnosis present

## 2024-04-17 DIAGNOSIS — Z5941 Food insecurity: Secondary | ICD-10-CM | POA: Diagnosis not present

## 2024-04-17 DIAGNOSIS — R45851 Suicidal ideations: Secondary | ICD-10-CM | POA: Diagnosis present

## 2024-04-17 DIAGNOSIS — K219 Gastro-esophageal reflux disease without esophagitis: Secondary | ICD-10-CM | POA: Diagnosis present

## 2024-04-17 DIAGNOSIS — E519 Thiamine deficiency, unspecified: Secondary | ICD-10-CM | POA: Diagnosis present

## 2024-04-17 DIAGNOSIS — F17210 Nicotine dependence, cigarettes, uncomplicated: Secondary | ICD-10-CM | POA: Diagnosis present

## 2024-04-17 DIAGNOSIS — Z560 Unemployment, unspecified: Secondary | ICD-10-CM

## 2024-04-17 DIAGNOSIS — K859 Acute pancreatitis without necrosis or infection, unspecified: Secondary | ICD-10-CM | POA: Diagnosis present

## 2024-04-17 DIAGNOSIS — F1094 Alcohol use, unspecified with alcohol-induced mood disorder: Secondary | ICD-10-CM | POA: Diagnosis present

## 2024-04-17 DIAGNOSIS — G40909 Epilepsy, unspecified, not intractable, without status epilepticus: Secondary | ICD-10-CM | POA: Diagnosis present

## 2024-04-17 DIAGNOSIS — R45850 Homicidal ideations: Secondary | ICD-10-CM | POA: Diagnosis present

## 2024-04-17 DIAGNOSIS — Z79899 Other long term (current) drug therapy: Secondary | ICD-10-CM | POA: Diagnosis not present

## 2024-04-17 DIAGNOSIS — Z5902 Unsheltered homelessness: Secondary | ICD-10-CM | POA: Diagnosis not present

## 2024-04-17 DIAGNOSIS — F17200 Nicotine dependence, unspecified, uncomplicated: Secondary | ICD-10-CM | POA: Diagnosis present

## 2024-04-17 LAB — RAPID URINE DRUG SCREEN, HOSP PERFORMED
Amphetamines: NOT DETECTED
Barbiturates: POSITIVE — AB
Benzodiazepines: POSITIVE — AB
Cocaine: NOT DETECTED
Opiates: NOT DETECTED
Tetrahydrocannabinol: NOT DETECTED

## 2024-04-17 MED ORDER — LORAZEPAM 1 MG PO TABS
1.0000 mg | ORAL_TABLET | Freq: Every day | ORAL | Status: DC
  Filled 2024-04-17: qty 1

## 2024-04-17 MED ORDER — VITAMIN B-1 100 MG PO TABS
100.0000 mg | ORAL_TABLET | Freq: Every day | ORAL | Status: DC
  Administered 2024-04-18 – 2024-04-25 (×8): 100 mg via ORAL
  Filled 2024-04-17 (×8): qty 1

## 2024-04-17 MED ORDER — NICOTINE 14 MG/24HR TD PT24
14.0000 mg | MEDICATED_PATCH | Freq: Every day | TRANSDERMAL | Status: DC
  Administered 2024-04-17: 14 mg via TRANSDERMAL
  Filled 2024-04-17: qty 1

## 2024-04-17 MED ORDER — THIAMINE HCL 100 MG/ML IJ SOLN: 100.0000 mg | Freq: Every day | INTRAMUSCULAR | Status: DC

## 2024-04-17 MED ORDER — DOXYCYCLINE HYCLATE 100 MG PO TABS
100.0000 mg | ORAL_TABLET | Freq: Two times a day (BID) | ORAL | Status: DC
  Administered 2024-04-17 – 2024-04-25 (×16): 100 mg via ORAL
  Filled 2024-04-17 (×16): qty 1

## 2024-04-17 MED ORDER — LOPERAMIDE HCL 2 MG PO CAPS: 2.0000 mg | ORAL_CAPSULE | ORAL | Status: AC | PRN

## 2024-04-17 MED ORDER — BICTEGRAVIR-EMTRICITAB-TENOFOV 50-200-25 MG PO TABS
1.0000 | ORAL_TABLET | Freq: Every day | ORAL | Status: AC
Start: 2024-04-18 — End: ?
  Administered 2024-04-18 – 2024-04-25 (×8): 1 via ORAL
  Filled 2024-04-17 (×9): qty 1

## 2024-04-17 MED ORDER — HYDROXYZINE HCL 25 MG PO TABS
25.0000 mg | ORAL_TABLET | Freq: Four times a day (QID) | ORAL | Status: AC | PRN
  Administered 2024-04-18 – 2024-04-20 (×4): 25 mg via ORAL
  Filled 2024-04-17 (×4): qty 1

## 2024-04-17 MED ORDER — ACETAMINOPHEN 325 MG PO TABS
650.0000 mg | ORAL_TABLET | Freq: Four times a day (QID) | ORAL | Status: DC | PRN
  Administered 2024-04-20 – 2024-04-21 (×2): 650 mg via ORAL
  Filled 2024-04-17 (×2): qty 2

## 2024-04-17 MED ORDER — MAGNESIUM HYDROXIDE 400 MG/5ML PO SUSP: 30.0000 mL | Freq: Every day | ORAL | Status: DC | PRN

## 2024-04-17 MED ORDER — HALOPERIDOL 5 MG PO TABS
5.0000 mg | ORAL_TABLET | Freq: Three times a day (TID) | ORAL | Status: DC | PRN
  Administered 2024-04-18: 5 mg via ORAL
  Filled 2024-04-17: qty 1

## 2024-04-17 MED ORDER — ADULT MULTIVITAMIN W/MINERALS CH: 1.0000 | ORAL_TABLET | Freq: Every day | ORAL | Status: DC

## 2024-04-17 MED ORDER — THIAMINE HCL 100 MG/ML IJ SOLN
100.0000 mg | Freq: Once | INTRAMUSCULAR | Status: AC
  Administered 2024-04-17: 100 mg via INTRAMUSCULAR
  Filled 2024-04-17: qty 2

## 2024-04-17 MED ORDER — ADULT MULTIVITAMIN W/MINERALS CH
1.0000 | ORAL_TABLET | Freq: Every day | ORAL | Status: DC
  Administered 2024-04-18 – 2024-04-25 (×8): 1 via ORAL
  Filled 2024-04-17 (×9): qty 1

## 2024-04-17 MED ORDER — LORAZEPAM 1 MG PO TABS: 1.0000 mg | ORAL_TABLET | ORAL | Status: DC | PRN

## 2024-04-17 MED ORDER — LORAZEPAM 1 MG PO TABS
1.0000 mg | ORAL_TABLET | Freq: Four times a day (QID) | ORAL | Status: AC
  Administered 2024-04-17 – 2024-04-18 (×4): 1 mg via ORAL
  Filled 2024-04-17 (×4): qty 1

## 2024-04-17 MED ORDER — PANTOPRAZOLE SODIUM 40 MG PO TBEC
40.0000 mg | DELAYED_RELEASE_TABLET | Freq: Two times a day (BID) | ORAL | Status: DC
  Administered 2024-04-17 – 2024-04-25 (×16): 40 mg via ORAL
  Filled 2024-04-17 (×16): qty 1

## 2024-04-17 MED ORDER — ALUM & MAG HYDROXIDE-SIMETH 200-200-20 MG/5ML PO SUSP: 30.0000 mL | ORAL | Status: DC | PRN

## 2024-04-17 MED ORDER — FOLIC ACID 1 MG PO TABS: 1.0000 mg | ORAL_TABLET | Freq: Every day | ORAL | Status: DC

## 2024-04-17 MED ORDER — LORAZEPAM 1 MG PO TABS
1.0000 mg | ORAL_TABLET | Freq: Two times a day (BID) | ORAL | Status: AC
  Administered 2024-04-20 (×2): 1 mg via ORAL
  Filled 2024-04-17 (×2): qty 1

## 2024-04-17 MED ORDER — LORAZEPAM 2 MG/ML IJ SOLN: 2.0000 mg | Freq: Three times a day (TID) | INTRAMUSCULAR | Status: DC | PRN

## 2024-04-17 MED ORDER — DIPHENHYDRAMINE HCL 25 MG PO CAPS
50.0000 mg | ORAL_CAPSULE | Freq: Three times a day (TID) | ORAL | Status: DC | PRN
  Administered 2024-04-18: 50 mg via ORAL
  Filled 2024-04-17: qty 2

## 2024-04-17 MED ORDER — LORAZEPAM 1 MG PO TABS
1.0000 mg | ORAL_TABLET | Freq: Three times a day (TID) | ORAL | Status: AC
  Administered 2024-04-19 (×3): 1 mg via ORAL
  Filled 2024-04-17 (×3): qty 1

## 2024-04-17 MED ORDER — LORAZEPAM 1 MG PO TABS
1.0000 mg | ORAL_TABLET | Freq: Four times a day (QID) | ORAL | Status: AC | PRN
  Administered 2024-04-18 – 2024-04-19 (×3): 1 mg via ORAL
  Filled 2024-04-17 (×3): qty 1

## 2024-04-17 MED ORDER — QUETIAPINE FUMARATE 50 MG PO TABS
50.0000 mg | ORAL_TABLET | Freq: Two times a day (BID) | ORAL | Status: DC
  Administered 2024-04-17 – 2024-04-19 (×4): 50 mg via ORAL
  Filled 2024-04-17 (×5): qty 1

## 2024-04-17 MED ORDER — DIPHENHYDRAMINE HCL 50 MG/ML IJ SOLN: 50.0000 mg | Freq: Three times a day (TID) | INTRAMUSCULAR | Status: DC | PRN

## 2024-04-17 MED ORDER — ONDANSETRON 4 MG PO TBDP
4.0000 mg | ORAL_TABLET | Freq: Four times a day (QID) | ORAL | Status: AC | PRN
  Administered 2024-04-18: 4 mg via ORAL
  Filled 2024-04-17: qty 1

## 2024-04-17 MED ORDER — HALOPERIDOL LACTATE 5 MG/ML IJ SOLN: 10.0000 mg | Freq: Three times a day (TID) | INTRAMUSCULAR | Status: DC | PRN

## 2024-04-17 MED ORDER — HALOPERIDOL LACTATE 5 MG/ML IJ SOLN: 5.0000 mg | Freq: Three times a day (TID) | INTRAMUSCULAR | Status: DC | PRN

## 2024-04-17 MED ORDER — LORAZEPAM 2 MG/ML IJ SOLN: 1.0000 mg | INTRAMUSCULAR | Status: DC | PRN

## 2024-04-17 MED ORDER — VITAMIN B-1 100 MG PO TABS: 100.0000 mg | ORAL_TABLET | Freq: Every day | ORAL | Status: DC

## 2024-04-17 NOTE — ED Notes (Signed)
 Pt currently asleep. Natural rise and fall of chest noted. Sitter at bedside.

## 2024-04-17 NOTE — Progress Notes (Signed)
 Pt has been accepted to [facility] on 04/17/2024 Bed assignment: 400-1  Pt meets inpatient criteria per: Arvell Latin NP  Attending Physician will be: Dr. Zouev MD  Report can be called to: unit: Adult unit: [phone]  Pt can arrive after [facility] will update when patient can come.   Care Team Notified: [facility] Kathryn Parish RN, Arvell Latin NP, [place] Dimitri Fiscus RN  [place] Trease Bremner LCSW-A   04/17/2024 2:17 PM

## 2024-04-17 NOTE — Consult Note (Signed)
 [facility] Psychiatric Consult Initial  Patient Name: .BARUCH LEWERS  MRN: [id number]  DOB: 05-12-1986  Consult Order details:  Orders (From admission, onward)     Start     Ordered   04/16/24 2038  CONSULT TO CALL ACT TEAM       Ordering Provider: Nathanael Baker, DO  Provider:  (Not yet assigned)  Question:  Reason for Consult?  Answer:  Psych consult   04/16/24 2037             Mode of Visit: In person    Psychiatry Consult Evaluation  Service Date: April 17, 2024 LOS:  LOS: 0 days  Chief Complaint Alcohol intoxication, Alcohol withdrawal.  Primary Psychiatric Diagnoses  Alcohol use disorder with Severe Dependence. 2.  Recurrent Major Depressive disorder, severe without Psychotic features  Assessment  YSABEL COWGILL is a 38 y.o. male admitted: Presented to the EDfor 04/16/2024  2:12 PM for Alcohol intoxication, Alcohol withdrawal. He carries the psychiatric diagnoses of Schizophrenia, MDD, Polysubstance abuse. and has a past medical history of  Alcohol induced Pancreatitis, Pancytopenia and HIV.   His current presentation of Alcohol withdrawal and and intoxication  is most consistent with severe alcohol addiction and failed rehabilitation treatment. He meets criteria for inpatient Psychiatry hospitalization for safe detox treatment based on his presentation, level of Alcohol of 402 .  Current outpatient psychotropic medications include Seroquel , Gabapentin , Thiamine  and historically he has had a unknown  response to these medications. He was non compliant with medications prior to admission as evidenced by his report. On initial examination, patient was calm, tearful . Please see plan below for detailed recommendations.   Diagnoses:  Active Hospital problems: Principal Problem:   Alcohol use disorder, severe, dependence (HCC) Active Problems:   Severe episode of recurrent major depressive disorder, without psychotic features (HCC)    Plan   ## Psychiatric Medication  Recommendations:  Resume home Medications Continue CIWA Assessment protocol with Coverage with Ativan   ## Medical Decision Making Capacity: Not specifically addressed in this encounter  ## Further Work-up:  -- - most recent EKG on 04/16/2024 had QtC of 482 -- Pertinent labwork reviewed earlier this admission includes: Alcohol level, CBC, CMP,    ## Disposition:-- We recommend inpatient psychiatric hospitalization when medically cleared. Patient is under voluntary admission status at this time; please IVC if attempts to leave hospital.  ## Behavioral / Environmental: - No specific recommendations at this time.     ## Safety and Observation Level:  - Based on my clinical evaluation, I estimate the patient to be at Low risk of self harm in the current setting. - At this time, we recommend  routine. This decision is based on my review of the chart including patient's history and current presentation, interview of the patient, mental status examination, and consideration of suicide risk including evaluating suicidal ideation, plan, intent, suicidal or self-harm behaviors, risk factors, and protective factors. This judgment is based on our ability to directly address suicide risk, implement suicide prevention strategies, and develop a safety plan while the patient is in the clinical setting. Please contact our team if there is a concern that risk level has changed.  CSSR Risk Category:C-SSRS RISK CATEGORY: High Risk  Suicide Risk Assessment: Patient has following modifiable risk factors for suicide: active suicidal ideation, untreated depression, under treated depression , recklessness, medication noncompliance, and lack of access to outpatient mental health resources, which we are addressing by recommending inpatient Psychiatry hospitalization for detox treatment and  Depression treatment.. Patient has following non-modifiable or demographic risk factors for suicide: male gender and psychiatric  hospitalization Patient has the following protective factors against suicide: na  Thank you for this consult request. Recommendations have been communicated to the primary team.  We will continue to round on patient until he secures a bed in a Psychiatry unit at this time.   Dorthy Magnussen C Akaash Vandewater, NP-PMHNP-BC       History of Present Illness  Relevant Aspects of Hospital ED Course:  Admitted on 04/16/2024 for Alcohol intoxication, Alcohol withdrawal.  Patient is a male, 38 years old who frequents area ER for Alcohol intoxication and withdrawal.  He came in with with alcohol level 402 and stating he wanted to hurt everybody with his hand.  He also reported feeling suicidal.  Patient engaged in meaningful conversation this morning and was tearful.  Patient states he is suffering from Pancreatitis due to his long hx of Alcoholism.  His AST/ALT are elevated.  He reports two failed Alcohol rehab treatment.  Patient reports drinking half a gallon of Vodka daily and have been doing so for ten years plus he said.  Patient is homeless, unemployed and states he is estranged from his family.  Patient denies any Mental illness but review of chart reveals hx of Schizophrenia and another chart mentioned Bipolar disorder.  He has been to multiple area ER for Alcohol issues.  Patient admitted not taking his Psychotropic medications and he has no outpatient Psychiatrist.  Patient is emaciated, thin and disheveled.  Patient admitted to feeling "sad and depressed and anxious" but related his mood to his Alcohol use.  Patient barely made eye contact with provider stating he is ready to stop drinking Alcohol.  Patient reports feeling hopeless and helpless.  Patient however denied feeling SI/HI/AVH but added he hears voices and sees shadows of things when he is withdrawing from Alcohol.  Patient was hospitalized at [facility] in April for alcohol intoxication.  We will seek bed placement and admit patient.  He is on CIWA Protocol with  Ativan  coverage.   Psych ROS:  Depression: yes Anxiety:  yes Mania (lifetime and current): na Psychosis: (lifetime and current): na  Collateral information:  Contacted -Declined calling anybody  Review of Systems  Constitutional:  Positive for malaise/fatigue and weight loss.  HENT: Negative.    Eyes: Negative.   Respiratory: Negative.    Cardiovascular: Negative.   Gastrointestinal:        Hx Pancreatitis  Genitourinary: Negative.   Musculoskeletal: Negative.   Skin: Negative.   Neurological: Negative.   Endo/Heme/Allergies: Negative.   Psychiatric/Behavioral:  Positive for depression and suicidal ideas. The patient is nervous/anxious.        Alcoholism     Psychiatric and Social History  Psychiatric History:  Information collected from Patient/Medical record  Prev Dx/Sx: see above Current Psych Provider: none Home Meds (current): see above Previous Med Trials: unknown Therapy: denies  Prior Psych Hospitalization: yes  Prior Self Harm: denies Prior Violence: denies  Family Psych History: yes-brother-Schizophrenia, Bipolar, Multiple family members Alcoholic Family Hx suicide: Denies  Social History:  Developmental Hx: wnl Educational Hx: unknown Occupational Hx: unemployed Armed forces operational officer Hx: denies Living Situation: homeless in [place] Spiritual Hx: denies Access to weapons/lethal means: denies   Substance History Alcohol: yes  Type of alcohol Liquor-Vodka Last Drink Yesterday morning Number of drinks per day half a gallon daily History of alcohol withdrawal seizures denies History of DT's yes Tobacco: yes Illicit drugs: denies Prescription drug abuse:  Denied Rehab hx: twice, failed.  Relapsed immediately leaving facility  Exam Findings  Physical Exam:  Vital Signs:  Temp:  [98.4 F (36.9 C)-98.6 F (37 C)] 98.4 F (36.9 C) (06/09 0606) Pulse Rate:  [78-110] 88 (06/09 0829) Resp:  [19-22] 20 (06/09 0606) BP: (103-117)/(67-83) 117/76 (06/09  0829) SpO2:  [98 %-100 %] 98 % (06/09 0606) Weight:  [62 kg] 62 kg (06/08 1420) Blood pressure 117/76, pulse 88, temperature 98.4 F (36.9 C), temperature source Oral, resp. rate 20, height 6' (1.829 m), weight 62 kg, SpO2 98%. Body mass index is 18.54 kg/m.  Physical Exam Vitals and nursing note reviewed.  Constitutional:      Appearance: He is ill-appearing.  HENT:     Nose: Nose normal.  Cardiovascular:     Rate and Rhythm: Normal rate and regular rhythm.  Pulmonary:     Effort: Pulmonary effort is normal.  Musculoskeletal:        General: Normal range of motion.  Skin:    General: Skin is dry.  Neurological:     Mental Status: He is oriented to person, place, and time.  Psychiatric:        Attention and Perception: Attention normal.        Mood and Affect: Mood is anxious and depressed. Affect is tearful.        Speech: Speech normal.        Behavior: Behavior normal. Behavior is cooperative.        Thought Content: Thought content normal.        Cognition and Memory: Cognition normal.        Judgment: Judgment is impulsive.     Mental Status Exam: General Appearance: Casual, Disheveled, and emaciated  Orientation:  Full (Time, Place, and Person)  Memory:  Immediate;   Fair Recent;   Fair Remote;   Fair  Concentration:  Concentration: Good and Attention Span: Good  Recall:  Fair  Attention  Fair  Eye Contact:  Minimal  Speech:  Normal Rate  Language:  Good  Volume:  Decreased  Mood: "sad, depressed, anxious"  Affect:  Congruent  Thought Process:  Coherent  Thought Content:  Logical  Suicidal Thoughts:  No  Homicidal Thoughts:  No  Judgement:  Impaired  Insight:  Shallow  Psychomotor Activity:  Tremor  Akathisia:  NA  Fund of Knowledge:  Fair      Assets:  Manufacturing systems engineer Desire for Improvement  Cognition:  WNL  ADL's:  Impaired  AIMS (if indicated):        Other History   These have been pulled in through the EMR, reviewed, and updated if  appropriate.  Family History:  The patient's family history includes Alcohol abuse in his father and mother; Alcoholism in his mother; Cancer in an other family member; Colon cancer in an other family member; Hypertension in his mother; Other in an other family member.  Medical History: Past Medical History:  Diagnosis Date   Alcohol abuse    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   Anxiety    Bipolar 2 disorder (HCC)    HIV (human immunodeficiency virus infection) (HCC)    Pancreatitis    PTSD (post-traumatic stress disorder)    Schizophrenia (HCC)    Seizures (HCC)    Subdural hematoma (HCC)     Surgical History: Past Surgical History:  Procedure Laterality Date   BIOPSY  04/19/2022   Procedure: BIOPSY;  Surgeon: Normie Becton., MD;  Location: [facility] ENDOSCOPY;  Service: Gastroenterology;;  ENTEROSCOPY N/A 04/19/2022   Procedure: ENTEROSCOPY;  Surgeon: Mansouraty, Albino Alu., MD;  Location: [facility] ENDOSCOPY;  Service: Gastroenterology;  Laterality: N/A;   INCISION AND DRAINAGE PERIRECTAL ABSCESS N/A 09/24/2016   Procedure: IRRIGATION AND DEBRIDEMENT PERIRECTAL ABSCESS;  Surgeon: Gwyndolyn Lerner, MD;  Location: [facility] ORS;  Service: General;  Laterality: N/A;   none       Medications:   Current Facility-Administered Medications:    bictegravir-emtricitabine -tenofovir  AF (BIKTARVY ) 50-200-25 MG per tablet 1 tablet, 1 tablet, Oral, Daily, Afton Horse T, DO, 1 tablet at 04/17/24 0900   doxycycline  (VIBRA -TABS) tablet 100 mg, 100 mg, Oral, Q12H, Afton Horse T, DO, 100 mg at 04/17/24 0900   folic acid  (FOLVITE ) tablet 1 mg, 1 mg, Oral, Daily, Hinnant, Collin F, PA-C, 1 mg at 04/17/24 0900   LORazepam  (ATIVAN ) tablet 1-4 mg, 1-4 mg, Oral, Q1H PRN, 1 mg at 04/17/24 0900 **OR** LORazepam  (ATIVAN ) injection 1-4 mg, 1-4 mg, Intravenous, Q1H PRN, Hinnant, Collin F, PA-C   multivitamin with minerals tablet 1 tablet, 1 tablet, Oral, Daily, Hinnant, Collin F, PA-C, 1 tablet at  04/17/24 0902   pantoprazole  (PROTONIX ) EC tablet 40 mg, 40 mg, Oral, BID, Afton Horse T, DO, 40 mg at 04/17/24 0900   QUEtiapine  (SEROQUEL ) tablet 50 mg, 50 mg, Oral, BID, Afton Horse T, DO, 50 mg at 04/17/24 0900   thiamine  (VITAMIN B1) tablet 100 mg, 100 mg, Oral, Daily, 100 mg at 04/17/24 0900 **OR** thiamine  (VITAMIN B1) injection 100 mg, 100 mg, Intravenous, Daily, Hinnant, Collin F, PA-C  Current Outpatient Medications:    bictegravir-emtricitabine -tenofovir  AF (BIKTARVY ) 50-200-25 MG TABS tablet, Take 1 tablet by mouth daily., Disp: 30 tablet, Rfl: 5   folic acid  (FOLVITE ) 1 MG tablet, Take 1 tablet (1 mg total) by mouth daily., Disp: 30 tablet, Rfl: 2   lipase/protease/amylase 24000-76000 units CPEP, Take 1 capsule (24,000 Units total) by mouth 3 (three) times daily before meals., Disp: 270 capsule, Rfl: 0   pantoprazole  (PROTONIX ) 40 MG tablet, Take 1 tablet (40 mg total) by mouth 2 (two) times daily. (Patient taking differently: Take 40 mg by mouth 2 (two) times daily as needed (Acid reflux).), Disp: 60 tablet, Rfl: 2   thiamine  (VITAMIN B-1) 100 MG tablet, Take 1 tablet (100 mg total) by mouth daily., Disp: 30 tablet, Rfl: 2   chlordiazePOXIDE  (LIBRIUM ) 25 MG capsule, Take 2 tabs by mouth every 8 hours the first day, every 12 hours the second day, and once on the third day (Patient not taking: Reported on 04/17/2024), Disp: 12 capsule, Rfl: 0   gabapentin  (NEURONTIN ) 300 MG capsule, Take 2 capsules (600 mg total) by mouth 3 (three) times daily for 14 days. (Patient not taking: Reported on 04/17/2024), Disp: 84 capsule, Rfl: 0   midodrine  (PROAMATINE ) 10 MG tablet, Take 1 tablet (10 mg total) by mouth 3 (three) times daily. (Patient not taking: Reported on 03/31/2024), Disp: 90 tablet, Rfl: 0   QUEtiapine  (SEROQUEL ) 50 MG tablet, Take 1 tablet (50 mg total) by mouth 2 (two) times daily. (Patient not taking: Reported on 04/17/2024), Disp: 60 tablet, Rfl: 3  Allergies: Allergies  Allergen  Reactions   Tegretol  [Carbamazepine ] Other (See Comments)    Vertigo  Paralysis   Caffeine Palpitations    Alfreida Inches, NP

## 2024-04-18 ENCOUNTER — Encounter (HOSPITAL_COMMUNITY): Payer: Self-pay | Admitting: Nurse Practitioner

## 2024-04-18 ENCOUNTER — Other Ambulatory Visit: Payer: Self-pay

## 2024-04-18 ENCOUNTER — Other Ambulatory Visit (HOSPITAL_COMMUNITY): Payer: Self-pay

## 2024-04-18 DIAGNOSIS — F1020 Alcohol dependence, uncomplicated: Secondary | ICD-10-CM

## 2024-04-18 LAB — LIPID PANEL
Cholesterol: 162 mg/dL (ref 0–200)
HDL: 87 mg/dL (ref >40)
LDL Cholesterol: 66 mg/dL (ref 0–99)
Total CHOL/HDL Ratio: 1.9 ratio
Triglycerides: 47 mg/dL (ref <150)
VLDL: 9 mg/dL (ref 0–40)

## 2024-04-18 LAB — FOLATE: Folate: 17.7 ng/mL (ref >5.9)

## 2024-04-18 LAB — VITAMIN D 25 HYDROXY (VIT D DEFICIENCY, FRACTURES): Vit D, 25-Hydroxy: 43.23 ng/mL (ref 30–100)

## 2024-04-18 LAB — VITAMIN B12: Vitamin B-12: 299 pg/mL (ref 180–914)

## 2024-04-18 LAB — TSH: TSH: 2.899 u[IU]/mL (ref 0.350–4.500)

## 2024-04-18 MED ORDER — PANCRELIPASE (LIP-PROT-AMYL) 12000-38000 UNITS PO CPEP
24000.0000 [IU] | ORAL_CAPSULE | Freq: Three times a day (TID) | ORAL | Status: DC
  Administered 2024-04-18 – 2024-04-25 (×21): 24000 [IU] via ORAL
  Filled 2024-04-18 (×20): qty 2

## 2024-04-18 NOTE — Plan of Care (Signed)
  Problem: Education: Goal: Knowledge of [place] [facility] Education information/materials will improve Outcome: Progressing Goal: Mental status will improve Outcome: Progressing   Problem: Activity: Goal: Interest or engagement in activities will improve Outcome: Progressing   

## 2024-04-18 NOTE — Progress Notes (Signed)
 Pt c/o wanting a cigarette. Pt is fidgety and not sleeping. Pt given agitation protocol for mild agitation.  CIWA also assessed. CIWA=8. Pt states he sees big spiders in the corner of his room. Pt actually reached down in the corner of doorway like he was gathering spiders from a web. Pt denies other hallucinations, pain, nausea, sweats. Will continue to monitor.

## 2024-04-18 NOTE — Progress Notes (Signed)
   04/18/24 1945  Psych Admission Type (Psych Patients Only)  Admission Status Involuntary  Psychosocial Assessment  Patient Complaints Anxiety;Substance abuse  Eye Contact Brief  Facial Expression Anxious  Affect Anxious  Speech Logical/coherent  Interaction Assertive;Demanding  Motor Activity Slow  Appearance/Hygiene Disheveled  Behavior Characteristics Cooperative;Fidgety;Anxious  Mood Anxious  Thought Process  Coherency Circumstantial  Content Preoccupation  Delusions None reported or observed  Perception WDL  Hallucination None reported or observed  Judgment Impaired  Confusion Mild  Danger to Self  Current suicidal ideation? Denies  Danger to Others  Danger to Others None reported or observed   Progress note   D: Pt seen in his room having VS assessed. Pt denies SI, HI, AVH. Pt rates pain  0/10. Pt endorses anxiety but not depression. Pt states that he really wants a cigarette. "You can leave my stuff here and just take me outside for one cigarette." Pt states he smokes 5 cigarettes a day but the cravings are bad. Informed pt more than once that he is not allowed to leave the facility and that the campus is smoke-free. CIWA = 4. No other concerns noted at this time.  A: Pt provided support and encouragement. Pt given scheduled medication as prescribed. PRNs as appropriate. Q15 min checks for safety.   R: Pt safe on the unit. Will continue to monitor.

## 2024-04-18 NOTE — Progress Notes (Signed)
 Pt walking up and down the hallway asking for a cigarette. Pt offered nicotine  patch and/or nicotine  gum. Pt refused. "I just want one cigarette." Pt informed that he cannot be given a cigarette or let out of the facility to smoke one. Will continue to monitor.

## 2024-04-18 NOTE — BHH Counselor (Signed)
 Adult Comprehensive Assessment  Patient ID: Gary [name], male   DOB: 05-02-1986, 38 y.o.   MRN: [id number]  Information Source: Information source: Patient  Current Stressors:  Patient states their primary concerns and needs for treatment are:: "I was using the potty and I fell asleep and the police were called on me, I got really mad about that. I was not under the influence of any drugs or alcohol" Patient states their goals for this hospitilization and ongoing recovery are:: "I just need to learn how to be nicer, maybe learn how ask for more money" Educational / Learning stressors: None reported Employment / Job issues: None reported Family Relationships: None reported Surveyor, quantity / Lack of resources (include bankruptcy): "Yeah" Housing / Lack of housing: "I only have a little piece of land to myself" Physical health (include injuries & life threatening diseases): "I had a knee injury that happened 12 years ago" Social relationships: "I have a black boyfriend, tall and dark like I like them" Substance abuse: "Oh no" Bereavement / Loss: None reported  Living/Environment/Situation:  Living Arrangements: Other (Comment) Living conditions (as described by patient or guardian): Homeless Who else lives in the home?: Alone/NA How long has patient lived in current situation?: 1 year ago What is atmosphere in current home: Chaotic, Temporary  Family History:  Marital status: Single Are you sexually active?: Yes What is your sexual orientation?: "I have a boyfriend" Has your sexual activity been affected by drugs, alcohol, medication, or emotional stress?: No Does patient have children?: No  Childhood History:  By whom was/is the patient raised?: Both parents Additional childhood history information: Parents are not together Description of patient's relationship with caregiver when they were a child: "It was good, you know?" Patient's description of current relationship with people who  raised him/her: UTA "I cannot believe I am in tihs shit hole" How were you disciplined when you got in trouble as a child/adolescent?: "Beat ya know" Does patient have siblings?: Yes Number of Siblings: 6 Description of patient's current relationship with siblings: "I am close to all of them" 5 brothers and a sister Did patient suffer any verbal/emotional/physical/sexual abuse as a child?: No Did patient suffer from severe childhood neglect?: No Has patient ever been sexually abused/assaulted/raped as an adolescent or adult?: No Was the patient ever a victim of a crime or a disaster?: No Witnessed domestic violence?: No Has patient been affected by domestic violence as an adult?: No  Education:  Highest grade of school patient has completed: 12th Currently a student?: No Learning disability?: No  Employment/Work Situation:   Employment Situation: Unemployed Patient's Job has Been Impacted by Current Illness: No What is the Longest Time Patient has Held a Job?: 1 year 10 months Where was the Patient Employed at that Time?: [employer] Has Patient ever Been in the [employer]?: No  Financial Resources:   Surveyor, quantity resources: [employer], Food stamps Does patient have a Lawyer or guardian?: No  Alcohol/Substance Abuse:   What has been your use of drugs/alcohol within the last 12 months?: "No drugs, I am here because of alcohol withdrawal" If attempted suicide, did drugs/alcohol play a role in this?: No Alcohol/Substance Abuse Treatment Hx: Denies past history Has alcohol/substance abuse ever caused legal problems?: No  Social Support System:   Patient's Community Support System: Good Describe Community Support System: Friends, BF Type of faith/religion: Lynder Sanger How does patient's faith help to cope with current illness?: UTA  Leisure/Recreation:   Do You Have Hobbies?: Yes Leisure and  Hobbies: "Things I like to do"  Strengths/Needs:   What is the patient's perception  of their strengths?: UTA Patient states they can use these personal strengths during their treatment to contribute to their recovery: UTA Patient states these barriers may affect/interfere with their treatment: None reported Patient states these barriers may affect their return to the community: "I am not interested in any of this help" Other important information patient would like considered in planning for their treatment: UTA  Discharge Plan:   Currently receiving community mental health services: No Patient states concerns and preferences for aftercare planning are: "I don't need help" Patient states they will know when they are safe and ready for discharge when: "I need to leave now" Does patient have access to transportation?: No Does patient have financial barriers related to discharge medications?: No Plan for no access to transportation at discharge: CSW to arrange Will patient be returning to same living situation after discharge?: Yes  Summary/Recommendations:   Summary and Recommendations (to be completed by the evaluator): [name] is a 38yo male who is involuntarily admitted to [facility] secondary to [facility] due to alcohol intoxication, suicidal ideation, and "wanting to hurt everyone with my hands." Stressors include homelessness, knee pain and finances. Pt agitated, demanding discharge, disorganized, and appeared to be responding to internal stimuli (turning and whispering during assessment). Denies AVH, SI and HI. Denies substance use; UDS + benzo and barbituates, alcohol intoxication upon ED arrival. Does not follow up with a therapist or psychiatrist in the community, refusing appointments at discharge. Not interested in substane use treament stating he does not use drugs or alcohol. Experiencing homelessness for the past year after a breakup with boyfriend, reports they are now back together but he lives on the street. Pt repeatedly stating "my doctors are a bunch of cocksuckers" and  then states "I will learn coping skills to be nicer I promise." While here, Braeson can benefit from crisis stabilization, medication management, therapeutic milieu, and referrals for services.   Vonzell Guerin. 04/18/2024

## 2024-04-18 NOTE — Progress Notes (Deleted)
   04/18/24 2340  CIWA-Ar  BP 104/74 (MAP84)  Pulse Rate 93  Nausea and Vomiting 0  Tactile Disturbances 0  Tremor 2  Auditory Disturbances 0  Paroxysmal Sweats 0  Visual Disturbances 4 (seeing spiders in the corner of the room)  Anxiety 1  Headache, Fullness in Head 0  Agitation 1  Orientation and Clouding of Sensorium 0  CIWA-Ar Total 8

## 2024-04-18 NOTE — Group Note (Signed)
 Recreation Therapy Group Note   Group Topic:Animal Assisted Therapy   Group Date: 04/18/2024 Start Time: 0946 End Time: 1030 Facilitators: Saga Balthazar-McCall, LRT,CTRS Location: [address] Dayroom   Animal-Assisted Activity (AAA) Program Checklist/Progress Notes Patient Eligibility Criteria Checklist & Daily Group note for Rec Tx Intervention  AAA/T Program Assumption of Risk Form signed by Patient/ or Parent Legal Guardian Yes  Patient understands his/her participation is voluntary Yes  Behavioral Response:    Education: Charity fundraiser, Appropriate Animal Interaction   Education Outcome: Acknowledges education.   Clinical Observations/Feedback: Patient attended session and interacted appropriately with therapy dog and peers. Patient asked appropriate questions about therapy dog and his training. Patient shared stories about their pets at home with group.   Sofia Dunn, LRT/CTRS    Affect/Mood: N/A   Participation Level: Did not attend    Clinical Observations/Individualized Feedback:     Plan: Continue to engage patient in RT group sessions 2-3x/week.   Saleena Tamas-McCall, LRT,CTRS 04/18/2024 12:08 PM

## 2024-04-18 NOTE — Progress Notes (Signed)
 Pt is asking to be discharged, woke up  picked  his belongings ready to leave.  Pt stated he is not being given enough Ativan  to keep him from having a sz. Pt stated he wants to go back to the streets to medicated himself with alcohol. Pt is still unsteady on his feet, will continue to monitor.

## 2024-04-18 NOTE — Progress Notes (Signed)
 Pt CIWA=4 upon assessment at beginning of shift. Pulse 110 manual. Provider notified. Pt experiencing tremors and anxiety.

## 2024-04-18 NOTE — Plan of Care (Signed)
   Problem: Coping: Goal: Ability to verbalize frustrations and anger appropriately will improve Outcome: Progressing   Problem: Safety: Goal: Periods of time without injury will increase Outcome: Progressing

## 2024-04-18 NOTE — Progress Notes (Signed)
   04/18/24 0740  Psych Admission Type (Psych Patients Only)  Admission Status Involuntary  Psychosocial Assessment  Patient Complaints Anxiety;Depression  Eye Contact Brief  Facial Expression Anxious  Affect Depressed  Speech Logical/coherent  Interaction Assertive  Motor Activity Slow  Appearance/Hygiene Disheveled  Behavior Characteristics Cooperative  Mood Anxious  Thought Process  Coherency Blocking  Content Preoccupation  Delusions None reported or observed  Perception WDL  Hallucination None reported or observed  Judgment Poor  Confusion None  Danger to Self  Current suicidal ideation? Denies  Agreement Not to Harm Self Yes  Description of Agreement verbal  Danger to Others  Danger to Others None reported or observed

## 2024-04-18 NOTE — Progress Notes (Signed)
 Gary [name] is a 38 y.o. male involuntarily admitted for suicidal ideation with a plan to use guns or knives. Patient states that he has visual and auditory hallucinations of a man telling him that he is not good enough and cannot do things for himself. Patient has a past history significant for HIV, polysubstance abuse, alcohol use disorder severe, severe episode of recurrent major depressive disorder, delirium tremens, alcohol induced pancreatitis, seizure disorder, and chronic pancreatitis. Pt presented tremulous, generalized weakness, unable to walk or stand on his own, pt needed a wheel chair to ambulates to the unit. Oxygen level was at 75% with HR of 202. It took sometime before it normalize. Pt denies SI/HI at this time of admission and contracted fro safety. Consents signed, skin/belongings search completed and pt oriented to unit. Pt stable at this time. Pt given the opportunity to express concerns and ask questions. Pt given toiletries. Will continue to monitor.

## 2024-04-18 NOTE — Progress Notes (Signed)
  Wilhelm Hansen   Type of Note: Follow up's  Spoke with patient this morning who is demanding discharge. Pt refusing any aftercare appointments. Resources will be added to f/u for patient if he chooses.   Will monitor and update if pt changes mind during admission.   Signed:  Hiep Ollis, LCSW-A 04/18/2024  10:32 AM

## 2024-04-18 NOTE — Progress Notes (Signed)
 Specialty Pharmacy Refill Coordination Note  Gary [name] is a 38 y.o. male assessed today regarding refills of clinic administered specialty medication(s) Bictegravir-Emtricitab-Tenofov (BIKTARVY ) Quetiapine   Clinic requested Courier to Provider Office   Delivery date: 04/24/24   Verified address: [address] [place] [address]   Medication will be filled on 04/21/24.

## 2024-04-18 NOTE — Tx Team (Signed)
 Initial Treatment Plan 04/18/2024 12:48 AM Wilhelm Hansen [id number]    PATIENT STRESSORS: Financial difficulties   Health problems   Occupational concerns   Substance abuse     PATIENT STRENGTHS: Communication skills    PATIENT IDENTIFIED PROBLEMS: Depression   Substance use  "Housing"                 DISCHARGE CRITERIA:  Ability to meet basic life and health needs Adequate post-discharge living arrangements Improved stabilization in mood, thinking, and/or behavior Medical problems require only outpatient monitoring Motivation to continue treatment in a less acute level of care  PRELIMINARY DISCHARGE PLAN: Attend PHP/IOP Outpatient therapy Placement in alternative living arrangements  PATIENT/FAMILY INVOLVEMENT: This treatment plan has been presented to and reviewed with the patient, DELRICK DEHART, and/or family member.  The patient and family have been given the opportunity to ask questions and make suggestions.  Suan Elm, RN 04/18/2024, 12:48 AM

## 2024-04-18 NOTE — Group Note (Signed)
 Date:  04/18/2024 Time:  9:05 AM  Group Topic/Focus:  Goals Group:   The focus of this group is to help patients establish daily goals to achieve during treatment and discuss how the patient can incorporate goal setting into their daily lives to aide in recovery. Orientation:   The focus of this group is to educate the patient on the purpose and policies of crisis stabilization and provide a format to answer questions about their admission.  The group details unit policies and expectations of patients while admitted.    Participation Level:  Active  Participation Quality:  Intrusive  Affect:  Irritable and Resistant  Cognitive:  Disorganized  Insight: None  Engagement in Group:  Lacking  Modes of Intervention:  Orientation  Additional Comments:  wants to d/c  Violette Grief 04/18/2024, 9:05 AM

## 2024-04-18 NOTE — Group Note (Signed)
 Date:  04/18/2024 Time:  9:00 PM  Group Topic/Focus:  Wrap-Up Group:   The focus of this group is to help patients review their daily goal of treatment and discuss progress on daily workbooks.    Participation Level:  Minimal  Participation Quality:  Appropriate  Affect:  Appropriate  Cognitive:  Appropriate  Insight: Appropriate  Engagement in Group:  Limited  Modes of Intervention:  Discussion  Additional Comments:  Pt stated his goal for today was to focus on his treatment plan and discuss his discharge plan with his treatment team. Pt stated he accomplished his goals today. Pt stated he talked with his doctor and social worker about his care today. Pt rated his overall day a 7 out of 10. Pt stated the plan is for him to discharge on 04/19/2024. Pt stated he was able to contact his brother today which improved his overall day. Pt stated he felt better about himself today. Pt stated he was able to attend all meals. Pt stated he took all medications provided today. Pt stated he attend all groups held today. Pt stated his appetite was pretty good today. Pt rated sleep last night was fair. Pt stated the goal tonight was to get some rest. Pt stated he had no physical pain tonight. Pt deny visual hallucinations and auditory issues tonight. Pt denies thoughts of harming himself or others. Pt stated he would alert staff if anything changed  Dwaine Gip 04/18/2024, 9:00 PM

## 2024-04-19 ENCOUNTER — Encounter (HOSPITAL_COMMUNITY): Payer: Self-pay

## 2024-04-19 DIAGNOSIS — F1094 Alcohol use, unspecified with alcohol-induced mood disorder: Secondary | ICD-10-CM

## 2024-04-19 LAB — RPR: RPR Ser Ql: NONREACTIVE

## 2024-04-19 MED ORDER — RISPERIDONE 1 MG PO TBDP
1.0000 mg | ORAL_TABLET | Freq: Two times a day (BID) | ORAL | Status: DC
  Administered 2024-04-19: 1 mg via ORAL
  Filled 2024-04-19 (×2): qty 1

## 2024-04-19 MED ORDER — QUETIAPINE FUMARATE 100 MG PO TABS: 100.0000 mg | ORAL_TABLET | Freq: Every day | ORAL | Status: DC

## 2024-04-19 MED ORDER — NICOTINE 14 MG/24HR TD PT24
14.0000 mg | MEDICATED_PATCH | Freq: Every day | TRANSDERMAL | Status: DC
  Administered 2024-04-21 – 2024-04-22 (×2): 14 mg via TRANSDERMAL
  Filled 2024-04-19 (×6): qty 1

## 2024-04-19 MED ORDER — QUETIAPINE FUMARATE 100 MG PO TABS
100.0000 mg | ORAL_TABLET | Freq: Every day | ORAL | Status: DC
  Administered 2024-04-19: 100 mg via ORAL
  Filled 2024-04-19: qty 1

## 2024-04-19 NOTE — Progress Notes (Signed)
 1:1 Note: Patient maintained on constant supervision for safety.  Patient continues to pace the hallway attempting to go into peers room.  Routine safety checks maintained.

## 2024-04-19 NOTE — Progress Notes (Signed)
 Nursing 1:1 note D:Pt observed standing in room RIS. RR even and unlabored. No distress noted. A: 1:1 observation continues for safety and to prevent pt from entering rooms of other pts R: Pt remains safe

## 2024-04-19 NOTE — Group Note (Signed)
 Date:  04/19/2024 Time:  8:54 PM  Group Topic/Focus:  Wrap-Up Group:   The focus of this group is to help patients review their daily goal of treatment and discuss progress on daily workbooks.    Participation Level:  Active  Participation Quality:  Appropriate and Sharing  Affect:  Appropriate and Flat  Cognitive:  Appropriate  Insight: Appropriate  Engagement in Group:  Engaged  Modes of Intervention:  Activity and Socialization  Additional Comments:  Patient rated his day a 9 out of 10. Patient shared that he did not sleep much last night. Patient shared that he feels that he is getting better instead of just being out there drinking. Patient participated in activity after sharing.   Dillard Frame 04/19/2024, 8:54 PM

## 2024-04-19 NOTE — Progress Notes (Signed)
 Pt alerted staff of spiders coming out the restroom and all over the room. Writer turned on all of pt's lights in his room to assure him that no spiders were coming out the restroom or even in the room. Pt is extremely paranoid.

## 2024-04-19 NOTE — [facility] Suicide Risk Assessment (Signed)
 Suicide Risk Assessment  Admission Assessment    [facility] Admission Suicide Risk Assessment   Nursing information obtained from:  Patient Demographic factors:  Male, Low socioeconomic status, [name], lesbian, or bisexual orientation Current Mental Status:  NA Loss Factors:  Decline in physical health, Financial problems / change in socioeconomic status Historical Factors:  Impulsivity Risk Reduction Factors:  NA  Total Time spent with patient: 30 minutes Principal Problem: Alcohol-induced mood disorder (HCC) Diagnosis:  Principal Problem:   Alcohol-induced mood disorder (HCC) Active Problems:   HIV disease (HCC)   Alcohol use disorder, severe, dependence (HCC)   Tobacco use disorder   Pancreatitis  Subjective Data: See H&P   Continued Clinical Symptoms:  Alcohol Use Disorder Identification Test Final Score (AUDIT): 20 The Alcohol Use Disorders Identification Test, Guidelines for Use in Primary Care, Second Edition.  World Science writer [facility]). Score between 0-7:  no or low risk or alcohol related problems. Score between 8-15:  moderate risk of alcohol related problems. Score between 16-19:  high risk of alcohol related problems. Score 20 or above:  warrants further diagnostic evaluation for alcohol dependence and treatment.   CLINICAL FACTORS:   Alcohol/Substance Abuse/Dependencies Previous Psychiatric Diagnoses and Treatments Medical Diagnoses and Treatments/Surgeries   Musculoskeletal: Strength & Muscle Tone: within normal limits Gait & Station: normal Patient leans: N/A  Psychiatric Specialty Exam:  Presentation  General Appearance:  Disheveled  Eye Contact: Minimal  Speech: Clear and Coherent  Speech Volume: Normal  Handedness: -- (not assessed)   Mood and Affect  Mood: Irritable  Affect: Restricted   Thought Process  Thought Processes: Goal Directed  Descriptions of Associations:Intact  Orientation:Full (Time, Place and  Person)  Thought Content:Rumination  History of Schizophrenia/Schizoaffective disorder:No data recorded Duration of Psychotic Symptoms:No data recorded Hallucinations:Hallucinations: None  Ideas of Reference:None  Suicidal Thoughts:Suicidal Thoughts: No  Homicidal Thoughts:Homicidal Thoughts: No   Sensorium  Memory: Immediate Good  Judgment: Impaired  Insight: None   Executive Functions  Concentration: Fair  Attention Span: Fair  Recall: Good  Fund of Knowledge: Poor  Language: Poor   Psychomotor Activity  Psychomotor Activity: Psychomotor Activity: Tremor; Restlessness   Assets  Assets: Resilience   Sleep  Sleep: Sleep: Good    Physical Exam: Physical Exam See H&P  ROS See H&P  Blood pressure 102/74, pulse 91, temperature 98.6 F (37 C), temperature source Oral, resp. rate 14, height 6' (1.829 m), weight 62.1 kg, SpO2 98%. Body mass index is 18.58 kg/m.   COGNITIVE FEATURES THAT CONTRIBUTE TO RISK:  Closed-mindedness    SUICIDE RISK:   Mild:  Suicidal ideation of limited frequency, intensity, duration, and specificity.  There are no identifiable plans, no associated intent, mild dysphoria and related symptoms, good self-control (both objective and subjective assessment), few other risk factors, and identifiable protective factors, including available and accessible social support.  PLAN OF CARE: See H&P  I certify that inpatient services furnished can reasonably be expected to improve the patient's condition.   Lennard Quirk, NP 04/19/2024, 6:19 AM

## 2024-04-19 NOTE — [facility] Group Notes (Signed)
 Adult Psychoeducational Group Note  Date:  04/19/2024 Time:  9:22 AM  Group Topic/Focus:  Goals Group:   The focus of this group is to help patients establish daily goals to achieve during treatment and discuss how the patient can incorporate goal setting into their daily lives to aide in recovery. Orientation:   The focus of this group is to educate the patient on the purpose and policies of crisis stabilization and provide a format to answer questions about their admission.  The group details unit policies and expectations of patients while admitted.  Participation Level:  Active  Participation Quality:  Attentive  Affect:  Not Congruent  Cognitive:  Confused and Delusional  Insight: Limited  Engagement in Group:  Engaged  Modes of Intervention:  Discussion  Additional Comments:  Pt attended the goals group and remained appropriate, but confused during the duration of the group.   Tanyia Grabbe O 04/19/2024, 9:22 AM

## 2024-04-19 NOTE — Group Note (Signed)
 Recreation Therapy Group Note   Group Topic:Team Building  Group Date: 04/19/2024 Start Time: 1115 End Time: 1145 Facilitators: Darinda [place]-McCall, LRT,CTRS Location: [address] Dayroom   Group Topic: Communication, Team Building, Problem Solving  Goal Area(s) Addresses:  Patient will effectively work with peer towards shared goal.  Patient will identify skills used to make activity successful.  Patient will identify how skills used during activity can be used to reach post d/c goals.   Behavioral Response: Minimal  Intervention: STEM Activity  Activity: Landing Pad. In teams of 3-5, patients were given 12 plastic drinking straws and an equal length of masking tape. Using the materials provided, patients were asked to build a landing pad to catch a golf ball dropped from approximately 5 feet in the air. All materials were required to be used by the team in their design. LRT facilitated post-activity discussion.  Education: Pharmacist, community, Scientist, physiological, Discharge Planning   Education Outcome: Acknowledges education/In group clarification offered/Needs additional education.    Affect/Mood: Flat   Participation Level: None   Participation Quality: Independent   Behavior: On-looking and Paranoid   Speech/Thought Process: Barely audible  and Disorganized   Insight: None   Judgement: None   Modes of Intervention: STEM Activity   Patient Response to Interventions:  Disengaged   Education Outcome:  In group clarification offered    Clinical Observations/Individualized Feedback: Pt sat on the floor with the rest of the group. Pt had no engagement with the activity. Pt was responding to internal stimuli. Pt wasn't disruptive. Pt was more to himself even though, he would say something to his peers during group.      Plan: Continue to engage patient in RT group sessions 2-3x/week.   Shayona Hibbitts-McCall, LRT,CTRS 04/19/2024 2:08 PM

## 2024-04-19 NOTE — Progress Notes (Signed)
   04/19/24 1945  Psych Admission Type (Psych Patients Only)  Admission Status Involuntary  Psychosocial Assessment  Patient Complaints Substance abuse;Anxiety  Eye Contact Fair  Facial Expression Anxious  Affect Anxious  Speech Tangential  Interaction Assertive  Motor Activity Slow;Restless  Appearance/Hygiene Disheveled  Behavior Characteristics Cooperative;Anxious;Fidgety  Mood Anxious  Thought Process  Coherency Circumstantial  Content Preoccupation  Delusions None reported or observed  Perception WDL  Hallucination None reported or observed  Judgment Impaired  Confusion Mild  Danger to Self  Current suicidal ideation? Denies  Danger to Others  Danger to Others None reported or observed   Progress note   D: Pt seen with his sitter in dayroom. Pt denies SI, HI, AVH. Pt rates pain  0/10. Pt rates anxiety  some/10 and depression  0/10. Pt denies visual hallucinations of cats in trees and spiders all over the floor. Pt does endorses tactile hallucinations of itching and feeling like things are crawling over him. Endorses anxiety, tremors. Pt was off on the current year by one year. CIWA=8. Pt says he feels better but wants to get some sleep. Medication added to assist with that. Pt states he is eating well. Attended group tonight with sitter. No other concerns noted at this time.  A: Pt provided support and encouragement. Pt given scheduled medication as prescribed. PRNs as appropriate. Q15 min checks for safety.   R: Pt safe on the unit. Will continue to monitor.

## 2024-04-19 NOTE — Progress Notes (Signed)
 Pt sitting in floor in room. Pt states he passed out and had a seizure even though staff has been checking on him every 15 minutes and he is in the floor sitting down. Pt checked bathroom with staff because he said he saw spiders. Now, he is saying that other people were in the room and saw him have a seizure. Provider notified. VS assessed and CIWA assessed. Pt CIWA=11. VSS. Pulse 104 manually. When asked about pain, pt states he has a headache 8/10 because his head is split open. He asks this Clinical research associate, Can't you see it? It's right here. Pt points to the center of his forehead which is free of any trauma.  Provider notified. Confirmed that PRN Ativan  is appropriate for patient. Will continue to monitor.

## 2024-04-19 NOTE — Plan of Care (Signed)
°  Problem: Education: °Goal: Emotional status will improve °Outcome: Progressing °  °Problem: Coping: °Goal: Ability to verbalize frustrations and anger appropriately will improve °Outcome: Progressing °Goal: Ability to demonstrate self-control will improve °Outcome: Progressing °  °Problem: Safety: °Goal: Periods of time without injury will increase °Outcome: Progressing °  °

## 2024-04-19 NOTE — H&P (Addendum)
 Psychiatric Admission Assessment Adult  Patient Identification: Gary [name] MRN:  [id number] Date of Evaluation:  04/19/2024 Chief Complaint:  Alcohol use disorder, severe, dependence (HCC) [F10.20] Principal Diagnosis: Alcohol-induced mood disorder (HCC) Diagnosis:  Principal Problem:   Alcohol-induced mood disorder (HCC) Active Problems:   HIV disease (HCC)   Alcohol use disorder, severe, dependence (HCC)   Tobacco use disorder   Pancreatitis  History of Present Illness: Gary [name] is a 38 year old male who presented to [facility] ED on April 16, 2024 intoxicated, experiencing alcohol withdrawal and expressing suicidal ideation.  He has a documented history of multiple alcohol-related ED visits.  He was admitted to the [facility] on April 18, 2024 for treatment and stabilization. He has a past medical history significant of alcohol induced pancreatitis, pancytopenia, HIV.  Chart review revealed 2 prior psychiatric admission in June 2020 and September 2022.  Psychiatric history includes major depressive disorder, severe alcohol abuse.  On evaluation today, the patient reported being informed by the doctor that he was being discharged this morning and expressed anger over personal items being taken (belt and shoelaces). He also expressed significant frustration with his current placement, requesting bus passes to "get out of here". Patient stated, I'm angry I'm still sitting here; it's only hurting me being here." He denies current suicidal or homicidal ideation, intent, or plan; ED suicidal ideation cited as "had a breakdown. That's all". He denies delusions or current hallucinations; reports alcohol-related visual hallucinosis (floating spiders). Patient reported no auditory or visual hallucinations in current sober state. Patient currently declines rehab for substance use.  Objectively, the patient was cooperative with  encouragement but visibly upset. Affect restricted.  His judgment appeared limited as he asserts no need for psychiatric medications or treatment    The case was discussed with the attending psychiatrist A.Parker. Plan: Continue CIWA protocol with scheduled Ativan  taper.  Continue Seroquel  50 mg twice daily.  Elevated liver enzymes - AST, ALT.  Labs ordered: Vitamin D, B12, TSH, RPR, lipid panel, A1c, folate. Resume Lipase. Continue doxycycline  initiated in the ED for cellulitis on right hand.   Anticipate brief inpatient stay; possible discharge within 1-2 days if stabilized.    Past Psychiatric History   Diagnoses: Schizophrenia, major depressive disorder, polysubstance abuse (per chart review)   Hospitalizations: Denies prior psychiatric inpatient care.  Chart review revealed 2 prior psychiatric admissions at [facility] in June 2020 and September 2022.    Suicide / Self-Harm: Denies past suicide attempts or self-injurious behaviors; denies access to firearms   Substance Use: 10+ years of daily alcohol use approximately  gallon liquor/day; vapes and smokes   pack cigarettes daily; under influence, reports transient visual hallucinations ("floating spiders"); denies current substance use    Medical History (obtained from patient on chart review)   Alcohol-induced pancreatitis   Pancytopenia   HIV (on Biktarvy )   Seizure disorder (last seizure  3?years ago, neurologist-managed)   Right hand cellulitis (started doxycycline  in ED)    Medications   Outpatient psychotropics: None currently    Medical: Biktarvy , Lipase   Past medication trial from inpatient admit: Doxepin , Gabapentin , Seroquel      Social History   Living situation: [name]   Education: Completed 12th grade   Employment: Unemployed; pans for money on roadside   Support system: Brother (intermittently)   Religion / Hobbies: Christian; enjoys video games, socializing   Sexual orientation: [name] (gender identity undisclosed)    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   Associated Signs/Symptoms: Depression Symptoms:  Denies at this time (Hypo) Manic Symptoms:  Denies at this time. However, currently exhibiting irritability and impulsivity.  Anxiety Symptoms:  Denies at this time  Psychotic Symptoms:  Hallucinations: Visual Reports seeing floating spiders when under the influence of alcohol.   PTSD Symptoms: Denies at this time  Total Time spent with patient: 1 hour  Past Psychiatric History: As listed above   Is the patient at risk to self? No.  Has the patient been a risk to self in the past 6 months? No.  Has the patient been a risk to self within the distant past? No.  Is the patient a risk to others? No.  Has the patient been a risk to others in the past 6 months? No.  Has the patient been a risk to others within the distant past? No.   [place] Scale:  Flowsheet Row Ad[facility] ([facility]) [facility] from 04/16/2024 in [facility] at [facility] ED to Hosp-Admission (Discharged) from 03/31/2024 in [facility] ([facility]A)  C-SSRS RISK CATEGORY No Risk High Risk No Risk        Prior Inpatient Therapy: Yes.   [facility] 2020, 2022  Prior Outpatient Therapy: Denies    Alcohol Screening: 1. How often do you have a drink containing alcohol?: 2 to 3 times a week 2. How many drinks containing alcohol do you have on a typical day when you are drinking?: 5 or 6 3. How often do you have six or more drinks on one occasion?: Daily or almost daily AUDIT-C Score: 9 4. How often during the last year have you found that you were not able to stop drinking once you had started?:  Monthly 5. How often during the last year have you failed to do what was normally expected from you because of drinking?: Weekly 6. How often during the last year have you needed a first drink in the morning to get yourself going after a heavy drinking session?: Weekly 7. How often during the last year have you had a feeling of guilt of remorse after drinking?: Monthly 8. How often during the last year have you been unable to remember what happened the night before because you had been drinking?: Less than monthly 9. Have you or someone else been injured as a result of your drinking?: No 10. Has a relative or friend or a doctor or another health worker been concerned about your drinking or suggested you cut down?: No Alcohol Use Disorder Identification Test Final Score (AUDIT): 20 Alcohol Brief Interventions/Follow-up: Alcohol education/Brief advice Substance Abuse History in the last 12 months:  Yes.   Consequences of Substance Abuse: Negative Medical Consequences:  Pancreatitis  Withdrawal Symptoms:   Diaphoresis Tremors Previous Psychotropic Medications: Yes  Psychological Evaluations: Yes  Past Medical History:  Past Medical History:  Diagnosis Date   Alcohol abuse    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   Anxiety    Bipolar 2 disorder (HCC)    HIV (human immunodeficiency virus infection) (HCC)    Pancreatitis    PTSD (post-traumatic stress disorder)    Schizophrenia (HCC)    Seizures (HCC)    Subdural hematoma (HCC)  Past Surgical History:  Procedure Laterality Date   BIOPSY  04/19/2022   Procedure: BIOPSY;  Surgeon: Brice Campi Albino Alu., MD;  Location: [facility] ENDOSCOPY;  Service: Gastroenterology;;   ENTEROSCOPY N/A 04/19/2022   Procedure: ENTEROSCOPY;  Surgeon: Brice Campi Albino Alu., MD;  Location: [facility] ENDOSCOPY;  Service: Gastroenterology;  Laterality: N/A;   INCISION AND DRAINAGE PERIRECTAL ABSCESS N/A 09/24/2016   Procedure: IRRIGATION AND DEBRIDEMENT PERIRECTAL  ABSCESS;  Surgeon: Gwyndolyn Lerner, MD;  Location: [facility] ORS;  Service: General;  Laterality: N/A;   none     Family History:  Family History  Problem Relation Age of Onset   Hypertension Mother    Alcohol abuse Mother    Alcoholism Mother    Alcohol abuse Father    Colon cancer Other    Other Other    Cancer Other    Family Psychiatric  History: As listed above Tobacco Screening:  Social History   Tobacco Use  Smoking Status Every Day   Current packs/day: 0.50   Average packs/day: 0.5 packs/day for 21.4 years (10.7 ttl pk-yrs)   Types: Cigarettes   Start date: 2004  Smokeless Tobacco Never  Tobacco Comments   unable to smoke while incarcerated 6+ months 02/13/20    BH Tobacco Counseling     Are you interested in Tobacco Cessation Medications?  No, patient refused Counseled patient on smoking cessation:  Yes Reason Tobacco Screening Not Completed: No value filed.       Social History:  Social History   Substance and Sexual Activity  Alcohol Use Yes   Alcohol/week: 105.0 standard drinks of alcohol   Types: 105 Cans of beer per week   Comment: drinks every day, all day, whatever I can get my hands on. Half a gallon of vodka 12/16     Social History   Substance and Sexual Activity  Drug Use Not Currently   Types: Methamphetamines, Cocaine   Comment: last used 10/22/2023    Additional Social History: Marital status: Single Are you sexually active?: Yes What is your sexual orientation?: I have a boyfriend Has your sexual activity been affected by drugs, alcohol, medication, or emotional stress?: No Does patient have children?: No                         Allergies:   Allergies  Allergen Reactions   Tegretol  [Carbamazepine ] Other (See Comments)    Vertigo  Paralysis   Caffeine Palpitations   Lab Results:  Results for orders placed or performed during the hospital encounter of 04/17/24 (from the past 48 hours)  Folate     Status: None    Collection Time: 04/18/24  6:47 PM  Result Value Ref Range   Folate 17.7 >5.9 ng/mL    Comment: Performed at [facility], [address], [place], [place] [address]  Lipid panel     Status: None   Collection Time: 04/18/24  6:47 PM  Result Value Ref Range   Cholesterol 162 0 - 200 mg/dL   Triglycerides 47 <956 mg/dL   HDL 87 >21 mg/dL   Total CHOL/HDL Ratio 1.9 RATIO   VLDL 9 0 - 40 mg/dL   LDL Cholesterol 66 0 - 99 mg/dL    Comment:        Total Cholesterol/HDL:CHD Risk Coronary Heart Disease Risk Table                     Men   Women  1/2  Average Risk   3.4   3.3  Average Risk       5.0   4.4  2 X Average Risk   9.6   7.1  3 X Average Risk  23.4   11.0        Use the calculated Patient Ratio above and the CHD Risk Table to determine the patient's CHD Risk.        ATP III CLASSIFICATION (LDL):  <100     mg/dL   Optimal  161-096  mg/dL   Near or Above                    Optimal  130-159  mg/dL   Borderline  045-409  mg/dL   High  >811     mg/dL   Very High Performed at [facility], [address] [place], [place] [address]   TSH     Status: None   Collection Time: 04/18/24  6:47 PM  Result Value Ref Range   TSH 2.899 0.350 - 4.500 uIU/mL    Comment: Performed by a 3rd Generation assay with a functional sensitivity of <=0.01 uIU/mL. Performed at [facility], [address] [place], [place] [address]   Vitamin B12     Status: None   Collection Time: 04/18/24  6:47 PM  Result Value Ref Range   Vitamin B-12 299 180 - 914 pg/mL    Comment: (NOTE) This assay is not validated for testing neonatal or myeloproliferative syndrome specimens for Vitamin B12 levels. Performed at [facility], [address], [place], [place] [address]   VITAMIN D 25 Hydroxy (Vit-D Deficiency, Fractures)     Status: None   Collection Time: 04/18/24  6:47 PM  Result Value Ref Range   Vit D, 25-Hydroxy 43.23 30 -  100 ng/mL    Comment: (NOTE) Vitamin D deficiency has been defined by the Institute of Medicine  and an Endocrine Society practice guideline as a level of serum 25-OH  vitamin D less than 20 ng/mL (1,2). The Endocrine Society went on to  further define vitamin D insufficiency as a level between 21 and 29  ng/mL (2).  1. IOM (Institute of Medicine). 2010. Dietary reference intakes for  calcium  and D. [place]: [employer]. 2. Holick MF, Binkley Barnard, Bischoff-Ferrari HA, et al. Evaluation,  treatment, and prevention of vitamin D deficiency: an Endocrine  Society clinical practice guideline, JCEM. 2011 Jul; 96(7): 1911-30.  Performed at [facility], [address] [place], [place] [address]     Blood Alcohol level:  Lab Results  Component Value Date   ETH [facility]) 04/16/2024   ETH 115 (H) 03/31/2024    Metabolic Disorder Labs:  Lab Results  Component Value Date   HGBA1C 5.0 05/25/2023   MPG 96.8 05/25/2023   MPG 105.41 12/15/2022   No results found for: PROLACTIN Lab Results  Component Value Date   CHOL 162 04/18/2024   TRIG 47 04/18/2024   HDL 87 04/18/2024   CHOLHDL 1.9 04/18/2024   VLDL 9 04/18/2024   LDLCALC 66 04/18/2024   LDLCALC 73 12/15/2022    Current Medications: Current Facility-Administered Medications  Medication Dose Route Frequency Provider Last Rate Last Admin   acetaminophen  (TYLENOL ) tablet 650 mg  650 mg Oral Q6H PRN Onuoha, Josephine C, NP       alum & mag hydroxide-simeth (MAALOX/MYLANTA) 200-200-20 MG/5ML suspension 30 mL  30 mL Oral  Q4H PRN Onuoha, Josephine C, NP       bictegravir-emtricitabine -tenofovir  AF (BIKTARVY ) 50-200-25 MG per tablet 1 tablet  1 tablet Oral Daily Onuoha, Josephine C, NP   1 tablet at 04/18/24 0759   haloperidol  (HALDOL ) tablet 5 mg  5 mg Oral TID PRN Onuoha, Josephine C, NP   5 mg at 04/18/24 2332   And   diphenhydrAMINE  (BENADRYL ) capsule 50 mg  50 mg Oral TID PRN Onuoha, Josephine C, NP    50 mg at 04/18/24 2332   haloperidol  lactate (HALDOL ) injection 5 mg  5 mg Intramuscular TID PRN Onuoha, Josephine C, NP       And   diphenhydrAMINE  (BENADRYL ) injection 50 mg  50 mg Intramuscular TID PRN Onuoha, Josephine C, NP       And   LORazepam  (ATIVAN ) injection 2 mg  2 mg Intramuscular TID PRN Onuoha, Josephine C, NP       haloperidol  lactate (HALDOL ) injection 10 mg  10 mg Intramuscular TID PRN Onuoha, Josephine C, NP       And   diphenhydrAMINE  (BENADRYL ) injection 50 mg  50 mg Intramuscular TID PRN Onuoha, Josephine C, NP       And   LORazepam  (ATIVAN ) injection 2 mg  2 mg Intramuscular TID PRN Onuoha, Josephine C, NP       doxycycline  (VIBRA -TABS) tablet 100 mg  100 mg Oral Q12H Onuoha, Josephine C, NP   100 mg at 04/18/24 2044   hydrOXYzine  (ATARAX ) tablet 25 mg  25 mg Oral Q6H PRN Dorthea Gauze, NP   25 mg at 04/18/24 2044   lipase/protease/amylase (CREON ) capsule 24,000 Units  24,000 Units Oral TID AC Keatin Benham H, NP   24,000 Units at 04/18/24 1850   loperamide  (IMODIUM ) capsule 2-4 mg  2-4 mg Oral PRN Dorthea Gauze, NP       LORazepam  (ATIVAN ) tablet 1 mg  1 mg Oral Q6H PRN Dorthea Gauze, NP   1 mg at 04/19/24 0351   LORazepam  (ATIVAN ) tablet 1 mg  1 mg Oral TID Dorthea Gauze, NP       Followed by   Cecily Cohen ON 04/20/2024] LORazepam  (ATIVAN ) tablet 1 mg  1 mg Oral BID Dorthea Gauze, NP       Followed by   Cecily Cohen ON 04/21/2024] LORazepam  (ATIVAN ) tablet 1 mg  1 mg Oral Daily Dorthea Gauze, NP       magnesium  hydroxide (MILK OF MAGNESIA) suspension 30 mL  30 mL Oral Daily PRN Onuoha, Josephine C, NP       multivitamin with minerals tablet 1 tablet  1 tablet Oral Daily Dorthea Gauze, NP   1 tablet at 04/18/24 0759   nicotine  (NICODERM CQ  - dosed in mg/24 hours) patch 14 mg  14 mg Transdermal Daily Boaz Berisha H, NP       ondansetron  (ZOFRAN -ODT) disintegrating tablet 4 mg  4 mg Oral Q6H PRN Dorthea Gauze, NP   4 mg at 04/18/24 0710   pantoprazole  (PROTONIX ) EC tablet  40 mg  40 mg Oral BID Onuoha, Josephine C, NP   40 mg at 04/18/24 1831   QUEtiapine  (SEROQUEL ) tablet 50 mg  50 mg Oral BID Onuoha, Josephine C, NP   50 mg at 04/18/24 1831   thiamine  (Vitamin B-1) tablet 100 mg  100 mg Oral Daily Dorthea Gauze, NP   100 mg at 04/18/24 0800   PTA Medications: Medications Prior to Admission  Medication Sig Dispense Refill Last Dose/Taking   bictegravir-emtricitabine -tenofovir  AF (BIKTARVY )  50-200-25 MG TABS tablet Take 1 tablet by mouth daily. 30 tablet 5    chlordiazePOXIDE  (LIBRIUM ) 25 MG capsule Take 2 tabs by mouth every 8 hours the first day, every 12 hours the second day, and once on the third day (Patient not taking: Reported on 04/17/2024) 12 capsule 0    folic acid  (FOLVITE ) 1 MG tablet Take 1 tablet (1 mg total) by mouth daily. 30 tablet 2    gabapentin  (NEURONTIN ) 300 MG capsule Take 2 capsules (600 mg total) by mouth 3 (three) times daily for 14 days. (Patient not taking: Reported on 04/17/2024) 84 capsule 0    lipase/protease/amylase 24000-76000 units CPEP Take 1 capsule (24,000 Units total) by mouth 3 (three) times daily before meals. 270 capsule 0    midodrine  (PROAMATINE ) 10 MG tablet Take 1 tablet (10 mg total) by mouth 3 (three) times daily. (Patient not taking: Reported on 03/31/2024) 90 tablet 0    pantoprazole  (PROTONIX ) 40 MG tablet Take 1 tablet (40 mg total) by mouth 2 (two) times daily. (Patient taking differently: Take 40 mg by mouth 2 (two) times daily as needed (Acid reflux).) 60 tablet 2    QUEtiapine  (SEROQUEL ) 50 MG tablet Take 1 tablet (50 mg total) by mouth 2 (two) times daily. (Patient not taking: Reported on 04/17/2024) 60 tablet 3    thiamine  (VITAMIN B-1) 100 MG tablet Take 1 tablet (100 mg total) by mouth daily. 30 tablet 2     Musculoskeletal: Strength & Muscle Tone: within normal limits Gait & Station: normal Patient leans: N/A            Psychiatric Specialty Exam:  Presentation  General Appearance:   Disheveled  Eye Contact: Minimal  Speech: Clear and Coherent  Speech Volume: Normal  Handedness: -- (not assessed)   Mood and Affect  Mood: Irritable  Affect: Restricted   Thought Process  Thought Processes: Goal Directed  Duration of Psychotic Symptoms:N/A Past Diagnosis of Schizophrenia or Psychoactive disorder: No data recorded Descriptions of Associations:Intact  Orientation:Full (Time, Place and Person)  Thought Content:Rumination  Hallucinations:Hallucinations: None  Ideas of Reference:None  Suicidal Thoughts:Suicidal Thoughts: No  Homicidal Thoughts:Homicidal Thoughts: No   Sensorium  Memory: Immediate Good  Judgment: Impaired  Insight: None   Executive Functions  Concentration: Fair  Attention Span: Fair  Recall: Good  Fund of Knowledge: Poor  Language: Poor   Psychomotor Activity  Psychomotor Activity:Psychomotor Activity: Tremor; Restlessness   Assets  Assets: Resilience   Sleep  Sleep:Sleep: Good    Physical Exam: Physical Exam Vitals and nursing note reviewed.  Constitutional:      General: He is not in acute distress. HENT:     Nose: Nose normal.  Cardiovascular:     Rate and Rhythm: Normal rate.     Pulses: Normal pulses.  Pulmonary:     Effort: No respiratory distress.  Musculoskeletal:        General: Normal range of motion.     Cervical back: Normal range of motion.  Skin:    General: Skin is dry.  Neurological:     Mental Status: He is alert and oriented to person, place, and time.    Review of Systems  Constitutional:  Negative for chills, diaphoresis and fever.  HENT:  Negative for congestion and sore throat.   Eyes:  Negative for blurred vision and double vision.  Respiratory:  Negative for cough and shortness of breath.   Cardiovascular:  Negative for chest pain and palpitations.  Gastrointestinal:  Negative  for abdominal pain, constipation, diarrhea, nausea and vomiting.   Genitourinary:  Negative for dysuria.  Skin: Negative.   Neurological:  Positive for tremors. Negative for dizziness, tingling and headaches.  Psychiatric/Behavioral:  Positive for substance abuse. Negative for depression, hallucinations and suicidal ideas. The patient is not nervous/anxious and does not have insomnia.    Blood pressure 102/74, pulse 91, temperature 98.6 F (37 C), temperature source Oral, resp. rate 14, height 6' (1.829 m), weight 62.1 kg, SpO2 98%. Body mass index is 18.58 kg/m.  Treatment Plan Summary: Daily contact with patient to assess and evaluate symptoms and progress in treatment and Medication management    Diagnoses / Active Problems: Principal Problem:   Alcohol-induced mood disorder (HCC) Active Problems:   HIV disease (HCC)   Alcohol use disorder, severe, dependence (HCC)   Tobacco use disorder   Pancreatitis  PLAN: Safety and Monitoring:  -- Involuntary admission to inpatient psychiatric unit for safety, stabilization and treatment  -- Daily contact with patient to assess and evaluate symptoms and progress in treatment  -- Patient's case to be discussed in multi-disciplinary team meeting  -- Observation Level : q15 minute checks  -- Vital signs:  q12 hours  -- Precautions: suicide, elopement, and assault  2. Psychiatric Diagnoses and Treatment:   Alcohol Induced Mood Disorder   -- Continue Seroquel  50 mg BID   -- Continue BH agitation protocol -- Hydroxyzine  25 mg every 6 hours PRN, anxiety    --  The risks/benefits/side-effects/alternatives to this medication were discussed in detail with the patient and time was given for questions. The patient consents to medication trial.  -- FDA  -- Metabolic profile and EKG monitoring obtained while on an atypical antipsychotic (BMI: Lipid Panel: HbgA1c: QTc:)   -- Encouraged patient to participate in unit milieu and in scheduled group therapies   -- Short Term Goals: Ability to identify changes in  lifestyle to reduce recurrence of condition will improve, Ability to verbalize feelings will improve, Ability to disclose and discuss suicidal ideas, Ability to demonstrate self-control will improve, Ability to identify and develop effective coping behaviors will improve, Ability to maintain clinical measurements within normal limits will improve, Compliance with prescribed medications will improve, and Ability to identify triggers associated with substance abuse/mental health issues will improve  -- Long Term Goals: Improvement in symptoms so as ready for discharge    3. Medical Issues Being Addressed:    Alcohol withdrawal   -- CIWA,   -- Continue Ativan  taper, Thiamine , MVI with minerals  Cellulitis of right hand   -- Continue Doxycycline  100 mg Q12 x 18 doses   GERD  -- Protonix  40 mg daily   HIV  -- Resume BIKTARVY  daily   Pancreatis  -- Resume CREON  three times daily before meals    Tobacco Use Disorder  -- Nicotine  patch 14 mg/24 hours ordered  -- Smoking cessation encouraged  Labs, EKG reviewed    4. Discharge Planning:   -- Social work and case management to assist with discharge planning and identification of hospital follow-up needs prior to discharge  -- Estimated LOS: 5-7 days  -- Discharge Concerns: Need to establish a safety plan; Medication compliance and effectiveness  -- Discharge Goals: Return home with outpatient referrals for mental health follow-up including medication management/psychotherapy   I certify that inpatient services furnished can reasonably be expected to improve the patient's condition.    Lennard Quirk, NP 6/11/20256:16 AM

## 2024-04-19 NOTE — Progress Notes (Signed)
 Pt urinating on the wall. Pt still RIS. CIWA assessed. VSS. CIWA=11. Pt tremors, anxiety, headache, tactile and visual hallucinations. Sees the spiders again. PRNs given as appropriate. Pt refused pain medication.

## 2024-04-19 NOTE — BH IP Treatment Plan (Signed)
 Interdisciplinary Treatment and Diagnostic Plan Update  04/19/2024 Time of Session: 10:55 AM  Gary [name] MRN: [id number]  Principal Diagnosis: Alcohol-induced mood disorder (HCC)  Secondary Diagnoses: Principal Problem:   Alcohol-induced mood disorder (HCC) Active Problems:   HIV disease (HCC)   Alcohol use disorder, severe, dependence (HCC)   Tobacco use disorder   Pancreatitis   Current Medications:  Current Facility-Administered Medications  Medication Dose Route Frequency Provider Last Rate Last Admin   acetaminophen  (TYLENOL ) tablet 650 mg  650 mg Oral Q6H PRN Michel Agreste, Josephine C, NP       alum & mag hydroxide-simeth (MAALOX/MYLANTA) 200-200-20 MG/5ML suspension 30 mL  30 mL Oral Q4H PRN Michel Agreste, Josephine C, NP       bictegravir-emtricitabine -tenofovir  AF (BIKTARVY ) 50-200-25 MG per tablet 1 tablet  1 tablet Oral Daily Onuoha, Josephine C, NP   1 tablet at 04/19/24 4098   haloperidol  (HALDOL ) tablet 5 mg  5 mg Oral TID PRN Onuoha, Josephine C, NP   5 mg at 04/18/24 2332   And   diphenhydrAMINE  (BENADRYL ) capsule 50 mg  50 mg Oral TID PRN Onuoha, Josephine C, NP   50 mg at 04/18/24 2332   haloperidol  lactate (HALDOL ) injection 5 mg  5 mg Intramuscular TID PRN Onuoha, Josephine C, NP       And   diphenhydrAMINE  (BENADRYL ) injection 50 mg  50 mg Intramuscular TID PRN Onuoha, Josephine C, NP       And   LORazepam  (ATIVAN ) injection 2 mg  2 mg Intramuscular TID PRN Onuoha, Josephine C, NP       haloperidol  lactate (HALDOL ) injection 10 mg  10 mg Intramuscular TID PRN Onuoha, Josephine C, NP       And   diphenhydrAMINE  (BENADRYL ) injection 50 mg  50 mg Intramuscular TID PRN Onuoha, Josephine C, NP       And   LORazepam  (ATIVAN ) injection 2 mg  2 mg Intramuscular TID PRN Onuoha, Josephine C, NP       doxycycline  (VIBRA -TABS) tablet 100 mg  100 mg Oral Q12H Onuoha, Josephine C, NP   100 mg at 04/19/24 1191   hydrOXYzine  (ATARAX ) tablet 25 mg  25 mg Oral Q6H PRN Dorthea Gauze, NP    25 mg at 04/18/24 2044   lipase/protease/amylase (CREON ) capsule 24,000 Units  24,000 Units Oral TID AC Bennett, Christal H, NP   24,000 Units at 04/19/24 1717   loperamide  (IMODIUM ) capsule 2-4 mg  2-4 mg Oral PRN Dorthea Gauze, NP       LORazepam  (ATIVAN ) tablet 1 mg  1 mg Oral Q6H PRN Dorthea Gauze, NP   1 mg at 04/19/24 0351   [START ON 04/20/2024] LORazepam  (ATIVAN ) tablet 1 mg  1 mg Oral BID Dorthea Gauze, NP       Followed by   Cecily Cohen ON 04/21/2024] LORazepam  (ATIVAN ) tablet 1 mg  1 mg Oral Daily Dorthea Gauze, NP       magnesium  hydroxide (MILK OF MAGNESIA) suspension 30 mL  30 mL Oral Daily PRN Onuoha, Josephine C, NP       multivitamin with minerals tablet 1 tablet  1 tablet Oral Daily Dorthea Gauze, NP   1 tablet at 04/19/24 4782   nicotine  (NICODERM CQ  - dosed in mg/24 hours) patch 14 mg  14 mg Transdermal Daily Bennett, Christal H, NP       ondansetron  (ZOFRAN -ODT) disintegrating tablet 4 mg  4 mg Oral Q6H PRN Dorthea Gauze, NP   4 mg at 04/18/24  0710   pantoprazole  (PROTONIX ) EC tablet 40 mg  40 mg Oral BID Onuoha, Josephine C, NP   40 mg at 04/19/24 1717   [START ON 04/20/2024] QUEtiapine  (SEROQUEL ) tablet 100 mg  100 mg Oral QHS Parker, Alvin S, MD       risperiDONE (RISPERDAL M-TABS) disintegrating tablet 1 mg  1 mg Oral BID Parker, Alvin S, MD   1 mg at 04/19/24 1717   thiamine  (Vitamin B-1) tablet 100 mg  100 mg Oral Daily Dorthea Gauze, NP   100 mg at 04/19/24 1308   PTA Medications: Medications Prior to Admission  Medication Sig Dispense Refill Last Dose/Taking   bictegravir-emtricitabine -tenofovir  AF (BIKTARVY ) 50-200-25 MG TABS tablet Take 1 tablet by mouth daily. 30 tablet 5    chlordiazePOXIDE  (LIBRIUM ) 25 MG capsule Take 2 tabs by mouth every 8 hours the first day, every 12 hours the second day, and once on the third day (Patient not taking: Reported on 04/17/2024) 12 capsule 0    folic acid  (FOLVITE ) 1 MG tablet Take 1 tablet (1 mg total) by mouth daily. 30 tablet 2     gabapentin  (NEURONTIN ) 300 MG capsule Take 2 capsules (600 mg total) by mouth 3 (three) times daily for 14 days. (Patient not taking: Reported on 04/17/2024) 84 capsule 0    lipase/protease/amylase 24000-76000 units CPEP Take 1 capsule (24,000 Units total) by mouth 3 (three) times daily before meals. 270 capsule 0    midodrine  (PROAMATINE ) 10 MG tablet Take 1 tablet (10 mg total) by mouth 3 (three) times daily. (Patient not taking: Reported on 03/31/2024) 90 tablet 0    pantoprazole  (PROTONIX ) 40 MG tablet Take 1 tablet (40 mg total) by mouth 2 (two) times daily. (Patient taking differently: Take 40 mg by mouth 2 (two) times daily as needed (Acid reflux).) 60 tablet 2    QUEtiapine  (SEROQUEL ) 50 MG tablet Take 1 tablet (50 mg total) by mouth 2 (two) times daily. (Patient not taking: Reported on 04/17/2024) 60 tablet 3    thiamine  (VITAMIN B-1) 100 MG tablet Take 1 tablet (100 mg total) by mouth daily. 30 tablet 2     Patient Stressors: Financial difficulties   Health problems   Occupational concerns   Substance abuse    Patient Strengths: Communication skills   Treatment Modalities: Medication Management, Group therapy, Case management,  1 to 1 session with clinician, Psychoeducation, Recreational therapy.   Physician Treatment Plan for Primary Diagnosis: Alcohol-induced mood disorder (HCC) Long Term Goal(s): Improvement in symptoms so as ready for discharge   Short Term Goals: Ability to identify changes in lifestyle to reduce recurrence of condition will improve Ability to verbalize feelings will improve Ability to disclose and discuss suicidal ideas Ability to demonstrate self-control will improve Ability to identify and develop effective coping behaviors will improve Ability to maintain clinical measurements within normal limits will improve Compliance with prescribed medications will improve Ability to identify triggers associated with substance abuse/mental health issues will  improve  Medication Management: Evaluate patient's response, side effects, and tolerance of medication regimen.  Therapeutic Interventions: 1 to 1 sessions, Unit Group sessions and Medication administration.  Evaluation of Outcomes: Not Progressing  Physician Treatment Plan for Secondary Diagnosis: Principal Problem:   Alcohol-induced mood disorder (HCC) Active Problems:   HIV disease (HCC)   Alcohol use disorder, severe, dependence (HCC)   Tobacco use disorder   Pancreatitis  Long Term Goal(s): Improvement in symptoms so as ready for discharge   Short Term Goals: Ability to  identify changes in lifestyle to reduce recurrence of condition will improve Ability to verbalize feelings will improve Ability to disclose and discuss suicidal ideas Ability to demonstrate self-control will improve Ability to identify and develop effective coping behaviors will improve Ability to maintain clinical measurements within normal limits will improve Compliance with prescribed medications will improve Ability to identify triggers associated with substance abuse/mental health issues will improve     Medication Management: Evaluate patient's response, side effects, and tolerance of medication regimen.  Therapeutic Interventions: 1 to 1 sessions, Unit Group sessions and Medication administration.  Evaluation of Outcomes: Not Progressing   RN Treatment Plan for Primary Diagnosis: Alcohol-induced mood disorder (HCC) Long Term Goal(s): Knowledge of disease and therapeutic regimen to maintain health will improve  Short Term Goals: Ability to remain free from injury will improve, Ability to verbalize frustration and anger appropriately will improve, Ability to demonstrate self-control, Ability to participate in decision making will improve, Ability to verbalize feelings will improve, Ability to disclose and discuss suicidal ideas, Ability to identify and develop effective coping behaviors will improve, and  Compliance with prescribed medications will improve  Medication Management: RN will administer medications as ordered by provider, will assess and evaluate patient's response and provide education to patient for prescribed medication. RN will report any adverse and/or side effects to prescribing provider.  Therapeutic Interventions: 1 on 1 counseling sessions, Psychoeducation, Medication administration, Evaluate responses to treatment, Monitor vital signs and CBGs as ordered, Perform/monitor CIWA, COWS, AIMS and Fall Risk screenings as ordered, Perform wound care treatments as ordered.  Evaluation of Outcomes: Not Progressing   LCSW Treatment Plan for Primary Diagnosis: Alcohol-induced mood disorder (HCC) Long Term Goal(s): Safe transition to appropriate next level of care at discharge, Engage patient in therapeutic group addressing interpersonal concerns.  Short Term Goals: Engage patient in aftercare planning with referrals and resources, Increase social support, Increase ability to appropriately verbalize feelings, Increase emotional regulation, Facilitate acceptance of mental health diagnosis and concerns, Facilitate patient progression through stages of change regarding substance use diagnoses and concerns, Identify triggers associated with mental health/substance abuse issues, and Increase skills for wellness and recovery  Therapeutic Interventions: Assess for all discharge needs, 1 to 1 time with Social worker, Explore available resources and support systems, Assess for adequacy in community support network, Educate family and significant other(s) on suicide prevention, Complete Psychosocial Assessment, Interpersonal group therapy.  Evaluation of Outcomes: Not Progressing   Progress in Treatment: Attending groups: Yes. Participating in groups: Yes. Taking medication as prescribed: Yes. Toleration medication: Yes. Family/Significant other contact made: patient declined consents Patient  understands diagnosis: No. Discussing patient identified problems/goals with staff: No. Medical problems stabilized or resolved: Yes. Denies suicidal/homicidal ideation: Yes. Issues/concerns per patient self-inventory: No.  New problem(s) identified:  No  New Short Term/Long Term Goal(s):    medication stabilization, elimination of SI thoughts, development of comprehensive mental wellness plan.    Patient Goals:  Keep things moving forward.  I want to be released from the program.  Discharge Plan or Barriers:  Patient recently admitted. CSW will continue to follow and assess for appropriate referrals and possible discharge planning.    Reason for Continuation of Hospitalization: Anxiety Depression Homicidal ideation Medication stabilization Suicidal ideation  Estimated Length of Stay:  5 - 7 days  Last 3 [place] Suicide Severity Risk Score: Flowsheet Row Ad[facility] (Current) [facility] 04[facility]16/2024 in [facility] ED to Hosp-Admission (  Discharged) from [facility]/31/2024 in [facility] ([facility]  C-SSRS RISK CATEGORY No Risk High Risk No Risk       Last PHQ 2/9 Scores:    01/28/2024   10:37 AM 02/12/2021    5:32 AM  Depression screen PHQ 2/9  Decreased Interest 0 3  Down, Depressed, Hopeless 0 3  PHQ - 2 Score 0 6  Altered sleeping 3 3  Tired, decreased energy 3 3  Change in appetite 0 2  Feeling bad or failure about yourself  0 3  Trouble concentrating 0 2  Moving slowly or fidgety/restless 0 2  Suicidal thoughts 0 2  PHQ-9 Score 6 23  Difficult doing work/chores Not difficult at all Very difficult    Scribe for Treatment Team: Emie Sommerfeld O [place] Duan, LCSWA 04/19/2024 6:55 PM

## 2024-04-19 NOTE — Progress Notes (Signed)
 1:1 Note: Patient placed on constant supervision due to confusion and elopement risk.  Patient observed pacing the hallway going into peers room.  Patient becomes angry whenever he's redirected.  Standing in front of the double door to the unit.  Routine safety checks continues.  Patient is safe on the unit with supervision.

## 2024-04-19 NOTE — Progress Notes (Signed)
   04/19/24 1555  Spiritual Encounters  Type of Visit Initial  Care provided to: Patient  Spiritual Framework  Presenting Themes Other (comment) (needs)   While on unit, I met briefly with Mr. Rilley Stash.  We established a relationship of care. Asaph shared that he has some clothing needs (pants size 30/32 and a belt which I said may be available but not while on unit0.  Will share request with Kathrin Pares for f/u.  Lener Ventresca [name], M.Div [phone]

## 2024-04-19 NOTE — Progress Notes (Signed)
 [facility] MD Progress Note  04/19/2024 5:02 PM Gary [name]  MRN:  [id number]  Reason for admission: 38 year old male who presented to [name] ED on April 16, 2024 intoxicated, experiencing alcohol withdrawal and expressing suicidal ideation.  He has a documented history of multiple alcohol-related ED visits.  He was admitted to the [facility] on April 18, 2024 for treatment and stabilization. He has a past medical history significant of alcohol induced pancreatitis, pancytopenia, HIV.  Chart review revealed 2 prior psychiatric admission in June 2020 and September 2022.  Psychiatric history includes major depressive disorder, severe alcohol abuse.   Daily notes: Gary [name] is seen in his room this morning. He presents highly disorganized. He speaks, however, his speech appears incoherent. He is pacing along the unit hall. It appears he is urinating on himself as his room & patient himself has a very strong urine odor. He reports, I have been here for so many years. One of the techs reports that patient is not attending group sessions. It may be he does not understand to attend. Besides, he presents with a strong body odor that it will not be good to sit with the other patients in the day. The tech states that they will help him bath & provide him with some scrubs to charge in after bathing. Patient seems to be tolerating his treatment regimen as he appears to be in no apparent distress. Reviewed current plan of care, no changes made. Vital signs remain stable. Continue current plan of care as already in progress.  Principal Problem: Alcohol-induced mood disorder (HCC)  Diagnosis: Principal Problem:   Alcohol-induced mood disorder (HCC) Active Problems:   HIV disease (HCC)   Alcohol use disorder, severe, dependence (HCC)   Tobacco use disorder   Pancreatitis  Total Time spent with patient: 45 minutes  Past Psychiatric History: See H&P.  Past Medical History:  Past Medical History:   Diagnosis Date   Alcohol abuse    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   Anxiety    Bipolar 2 disorder (HCC)    HIV (human immunodeficiency virus infection) (HCC)    Pancreatitis    PTSD (post-traumatic stress disorder)    Schizophrenia (HCC)    Seizures (HCC)    Subdural hematoma (HCC)     Past Surgical History:  Procedure Laterality Date   BIOPSY  04/19/2022   Procedure: BIOPSY;  Surgeon: Normie Becton., MD;  Location: [facility] ENDOSCOPY;  Service: Gastroenterology;;   ENTEROSCOPY N/A 04/19/2022   Procedure: ENTEROSCOPY;  Surgeon: Normie Becton., MD;  Location: [facility] ENDOSCOPY;  Service: Gastroenterology;  Laterality: N/A;   INCISION AND DRAINAGE PERIRECTAL ABSCESS N/A 09/24/2016   Procedure: IRRIGATION AND DEBRIDEMENT PERIRECTAL ABSCESS;  Surgeon: Gwyndolyn Lerner, MD;  Location: [facility] ORS;  Service: General;  Laterality: N/A;   none     Family History:  Family History  Problem Relation Age of Onset   Hypertension Mother    Alcohol abuse Mother    Alcoholism Mother    Alcohol abuse Father    Colon cancer Other    Other Other    Cancer Other    Family Psychiatric  History: See H&P.  Social History:  Social History   Substance and Sexual Activity  Alcohol Use Yes   Alcohol/week: 105.0 standard drinks of alcohol   Types: 105 Cans of beer per week   Comment: drinks every day, all day, whatever I can get my hands on. Half a gallon of vodka 12/16     Social  History   Substance and Sexual Activity  Drug Use Not Currently   Types: Methamphetamines, Cocaine   Comment: last used 10/22/2023    Social History   Socioeconomic History   Marital status: Single    Spouse name: Not on file   Number of children: Not on file   Years of education: Not on file   Highest education level: Not on file  Occupational History   Occupation: unemployed  Tobacco Use   Smoking status: Every Day    Current packs/day: 0.50    Average packs/day: 0.5 packs/day for 21.4  years (10.7 ttl pk-yrs)    Types: Cigarettes    Start date: 2004   Smokeless tobacco: Never   Tobacco comments:    unable to smoke while incarcerated 6+ months 02/13/20  Vaping Use   Vaping status: Never Used  Substance and Sexual Activity   Alcohol use: Yes    Alcohol/week: 105.0 standard drinks of alcohol    Types: 105 Cans of beer per week    Comment: drinks every day, all day, whatever I can get my hands on. Half a gallon of vodka 12/16   Drug use: Not Currently    Types: Methamphetamines, Cocaine    Comment: last used 10/22/2023   Sexual activity: Yes    Partners: Male, Male    Comment: declined condoms 01/2024  Other Topics Concern   Not on file  Social History Narrative   Currently living on the streets   Independent at baseline   Occasionally goes to the [facility]   Social Drivers of Health   Financial Resource Strain: Patient Declined (10/11/2023)   Overall Financial Resource Strain (CARDIA)    Difficulty of Paying Living Expenses: Patient declined  Food Insecurity: Patient Declined (04/18/2024)   Hunger Vital Sign    Worried About Running Out of Food in the Last Year: Patient declined    Ran Out of Food in the Last Year: Patient declined  Recent Concern: Food Insecurity - Food Insecurity Present (02/19/2024)   Hunger Vital Sign    Worried About Running Out of Food in the Last Year: Often true    Ran Out of Food in the Last Year: Often true  Transportation Needs: Patient Declined (04/18/2024)   PRAPARE - Administrator, Civil Service (Medical): Patient declined    Lack of Transportation (Non-Medical): Patient declined  Recent Concern: Transportation Needs - Unmet Transportation Needs (02/19/2024)   PRAPARE - Transportation    Lack of Transportation (Medical): Yes    Lack of Transportation (Non-Medical): Yes  Physical Activity: Not on file  Stress: No Stress Concern Present (06/30/2022)   Received from [facility], [facility]   [employer] of  Occupational Health - Occupational Stress Questionnaire    Feeling of Stress : Not at all  Recent Concern: Stress - Stress Concern Present (06/25/2022)   Received from [facility] of Occupational Health - Occupational Stress Questionnaire    Feeling of Stress : Rather much  Social Connections: Patient Declined (04/18/2024)   Social Connection and Isolation Panel [NHANES]    Frequency of Communication with Friends and Family: Patient declined    Frequency of Social Gatherings with Friends and Family: Patient declined    Attends Religious Services: Patient declined    Active Member of Clubs or Organizations: Patient declined    Attends Banker Meetings: Patient declined    Marital Status: Patient declined  Recent Concern: Social Connections - Socially Isolated (02/03/2024)  Social Advertising account executive [NHANES]    Frequency of Communication with Friends and Family: Never    Frequency of Social Gatherings with Friends and Family: Never    Attends Religious Services: Never    Diplomatic Services operational officer: No    Attends Engineer, structural: Never    Marital Status: Never married   Additional Social History:   Sleep: Fair  Appetite:  Fair  Current Medications: Current Facility-Administered Medications  Medication Dose Route Frequency Provider Last Rate Last Admin   acetaminophen  (TYLENOL ) tablet 650 mg  650 mg Oral Q6H PRN Onuoha, Josephine C, NP       alum & mag hydroxide-simeth (MAALOX/MYLANTA) 200-200-20 MG/5ML suspension 30 mL  30 mL Oral Q4H PRN Onuoha, Josephine C, NP       bictegravir-emtricitabine -tenofovir  AF (BIKTARVY ) 50-200-25 MG per tablet 1 tablet  1 tablet Oral Daily Onuoha, Josephine C, NP   1 tablet at 04/19/24 0835   haloperidol  (HALDOL ) tablet 5 mg  5 mg Oral TID PRN Onuoha, Josephine C, NP   5 mg at 04/18/24 2332   And   diphenhydrAMINE  (BENADRYL ) capsule 50 mg  50 mg Oral TID PRN Onuoha, Josephine C, NP    50 mg at 04/18/24 2332   haloperidol  lactate (HALDOL ) injection 5 mg  5 mg Intramuscular TID PRN Onuoha, Josephine C, NP       And   diphenhydrAMINE  (BENADRYL ) injection 50 mg  50 mg Intramuscular TID PRN Onuoha, Josephine C, NP       And   LORazepam  (ATIVAN ) injection 2 mg  2 mg Intramuscular TID PRN Onuoha, Josephine C, NP       haloperidol  lactate (HALDOL ) injection 10 mg  10 mg Intramuscular TID PRN Onuoha, Josephine C, NP       And   diphenhydrAMINE  (BENADRYL ) injection 50 mg  50 mg Intramuscular TID PRN Onuoha, Josephine C, NP       And   LORazepam  (ATIVAN ) injection 2 mg  2 mg Intramuscular TID PRN Onuoha, Josephine C, NP       doxycycline  (VIBRA -TABS) tablet 100 mg  100 mg Oral Q12H Onuoha, Josephine C, NP   100 mg at 04/19/24 1610   hydrOXYzine  (ATARAX ) tablet 25 mg  25 mg Oral Q6H PRN Dorthea Gauze, NP   25 mg at 04/18/24 2044   lipase/protease/amylase (CREON ) capsule 24,000 Units  24,000 Units Oral TID AC Bennett, Christal H, NP   24,000 Units at 04/19/24 1210   loperamide  (IMODIUM ) capsule 2-4 mg  2-4 mg Oral PRN Dorthea Gauze, NP       LORazepam  (ATIVAN ) tablet 1 mg  1 mg Oral Q6H PRN Dorthea Gauze, NP   1 mg at 04/19/24 0351   LORazepam  (ATIVAN ) tablet 1 mg  1 mg Oral TID Dorthea Gauze, NP   1 mg at 04/19/24 1210   Followed by   Cecily Cohen ON 04/20/2024] LORazepam  (ATIVAN ) tablet 1 mg  1 mg Oral BID Dorthea Gauze, NP       Followed by   Cecily Cohen ON 04/21/2024] LORazepam  (ATIVAN ) tablet 1 mg  1 mg Oral Daily Dorthea Gauze, NP       magnesium  hydroxide (MILK OF MAGNESIA) suspension 30 mL  30 mL Oral Daily PRN Onuoha, Josephine C, NP       multivitamin with minerals tablet 1 tablet  1 tablet Oral Daily Dorthea Gauze, NP   1 tablet at 04/19/24 0834   nicotine  (NICODERM CQ  - dosed in mg/24 hours)  patch 14 mg  14 mg Transdermal Daily Bennett, Christal H, NP       ondansetron  (ZOFRAN -ODT) disintegrating tablet 4 mg  4 mg Oral Q6H PRN Dorthea Gauze, NP   4 mg at 04/18/24 0710   pantoprazole   (PROTONIX ) EC tablet 40 mg  40 mg Oral BID Onuoha, Josephine C, NP   40 mg at 04/19/24 0834   [START ON 04/20/2024] QUEtiapine  (SEROQUEL ) tablet 100 mg  100 mg Oral QHS Parker, Alvin S, MD       risperiDONE (RISPERDAL M-TABS) disintegrating tablet 1 mg  1 mg Oral BID Timmothy Foots, MD       thiamine  (Vitamin B-1) tablet 100 mg  100 mg Oral Daily Dorthea Gauze, NP   100 mg at 04/19/24 1610    Lab Results:  Results for orders placed or performed during the hospital encounter of 04/17/24 (from the past 48 hours)  Folate     Status: None   Collection Time: 04/18/24  6:47 PM  Result Value Ref Range   Folate 17.7 >5.9 ng/mL    Comment: Performed at [facility], [address] [place], [place] [address]  Lipid panel     Status: None   Collection Time: 04/18/24  6:47 PM  Result Value Ref Range   Cholesterol 162 0 - 200 mg/dL   Triglycerides 47 <409 mg/dL   HDL 87 >81 mg/dL   Total CHOL/HDL Ratio 1.9 RATIO   VLDL 9 0 - 40 mg/dL   LDL Cholesterol 66 0 - 99 mg/dL    Comment:        Total Cholesterol/HDL:CHD Risk Coronary Heart Disease Risk Table                     Men   Women  1/2 Average Risk   3.4   3.3  Average Risk       5.0   4.4  2 X Average Risk   9.6   7.1  3 X Average Risk  23.4   11.0        Use the calculated Patient Ratio above and the CHD Risk Table to determine the patient's CHD Risk.        ATP III CLASSIFICATION (LDL):  <100     mg/dL   Optimal  191-478  mg/dL   Near or Above                    Optimal  130-159  mg/dL   Borderline  295-621  mg/dL   High  >308     mg/dL   Very High Performed at [facility], [address] [place], [place] [address]   RPR     Status: None   Collection Time: 04/18/24  6:47 PM  Result Value Ref Range   RPR Ser Ql NON REACTIVE NON REACTIVE    Comment: Performed at [facility], [address] [place], [place] [address]  TSH     Status: None   Collection Time: 04/18/24  6:47 PM   Result Value Ref Range   TSH 2.899 0.350 - 4.500 uIU/mL    Comment: Performed by a 3rd Generation assay with a functional sensitivity of <=0.01 uIU/mL. Performed at [facility], [address], [place], [place] [address]   Vitamin B12     Status: None   Collection Time: 04/18/24  6:47 PM  Result Value Ref Range   Vitamin B-12  299 180 - 914 pg/mL    Comment: (NOTE) This assay is not validated for testing neonatal or myeloproliferative syndrome specimens for Vitamin B12 levels. Performed at [facility], [address] [place], [place] [address]   VITAMIN D 25 Hydroxy (Vit-D Deficiency, Fractures)     Status: None   Collection Time: 04/18/24  6:47 PM  Result Value Ref Range   Vit D, 25-Hydroxy 43.23 30 - 100 ng/mL    Comment: (NOTE) Vitamin D deficiency has been defined by the Institute of Medicine  and an Endocrine Society practice guideline as a level of serum 25-OH  vitamin D less than 20 ng/mL (1,2). The Endocrine Society went on to  further define vitamin D insufficiency as a level between 21 and 29  ng/mL (2).  1. IOM (Institute of Medicine). 2010. Dietary reference intakes for  calcium  and D. [place]: The [employer]. 2. Holick MF, Binkley [place], Bischoff-Ferrari HA, et al. Evaluation,  treatment, and prevention of vitamin D deficiency: an Endocrine  Society clinical practice guideline, JCEM. 2011 Jul; 96(7): 1911-30.  Performed at [facility], [address], [place], [place] [address]     Blood Alcohol level:  Lab Results  Component Value Date   ETH [facility]) 04/16/2024   ETH 115 (H) 03/31/2024    Metabolic Disorder Labs: Lab Results  Component Value Date   HGBA1C 5.0 05/25/2023   MPG 96.8 05/25/2023   MPG 105.41 12/15/2022   No results found for: PROLACTIN Lab Results  Component Value Date   CHOL 162 04/18/2024   TRIG 47 04/18/2024   HDL 87 04/18/2024   CHOLHDL 1.9 04/18/2024   VLDL 9  04/18/2024   LDLCALC 66 04/18/2024   LDLCALC 73 12/15/2022    Physical Findings: AIMS:  , ,  ,  ,    CIWA:  CIWA-Ar Total: 5 COWS:     Musculoskeletal: Strength & Muscle Tone: within normal limits Gait & Station: normal Patient leans: N/A  Psychiatric Specialty Exam:  Presentation  General Appearance:  Disheveled  Eye Contact: Minimal  Speech: Clear and Coherent  Speech Volume: Normal  Handedness: -- (not assessed)   Mood and Affect  Mood: Irritable  Affect: Restricted   Thought Process  Thought Processes: Goal Directed  Descriptions of Associations:Intact  Orientation:Full (Time, Place and Person)  Thought Content:Rumination  History of Schizophrenia/Schizoaffective disorder:No data recorded Duration of Psychotic Symptoms:No data recorded Hallucinations:Hallucinations: None  Ideas of Reference:None  Suicidal Thoughts:Suicidal Thoughts: No  Homicidal Thoughts:Homicidal Thoughts: No   Sensorium  Memory: Immediate Good  Judgment: Impaired  Insight: None   Executive Functions  Concentration: Fair  Attention Span: Fair  Recall: Good  Fund of Knowledge: Poor  Language: Poor   Psychomotor Activity  Psychomotor Activity: Psychomotor Activity: Tremor; Restlessness   Assets  Assets: Resilience   Sleep  Sleep: Sleep: Good  Physical Exam: Physical Exam Vitals and nursing note reviewed.  HENT:     Head: Normocephalic.     Nose: Nose normal.     Mouth/Throat:     Pharynx: Oropharynx is clear.  Cardiovascular:     Rate and Rhythm: Normal rate.     Pulses: Normal pulses.  Pulmonary:     Effort: Pulmonary effort is normal.  Genitourinary:    Comments: Deferred. Musculoskeletal:        General: Normal range of motion.     Cervical back: Normal range of motion.  Neurological:     Mental Status: He  is alert.     Comments: Appears disoriented & disorganized.    Review of Systems  Constitutional:  Negative for  chills, diaphoresis and fever.  HENT:  Negative for congestion and sore throat.   Respiratory:  Negative for cough, shortness of breath and wheezing.   Cardiovascular:  Negative for chest pain and palpitations.  Gastrointestinal:  Negative for abdominal pain, blood in stool, constipation, diarrhea, heartburn, melena, nausea and vomiting.  Musculoskeletal:  Negative for joint pain and myalgias.  Neurological:  Positive for weakness (Pt appears sluggish). Negative for dizziness, tingling, tremors, sensory change, speech change, focal weakness, seizures and headaches.  Psychiatric/Behavioral:  Positive for hallucinations and substance abuse.    Blood pressure 107/75, pulse 90, temperature 98.6 F (37 C), temperature source Oral, resp. rate 16, height 6' (1.829 m), weight 62.1 kg, SpO2 100%. Body mass index is 18.58 kg/m.  Treatment Plan Summary: Daily contact with patient to assess and evaluate symptoms and progress in treatment and Medication management.   Diagnoses / Active Problems: Principal Problem:   Alcohol-induced mood disorder (HCC) Active Problems:   HIV disease (HCC)   Alcohol use disorder, severe, dependence (HCC)   Tobacco use disorder   Pancreatitis   PLAN: Safety and Monitoring:             -- Involuntary admission to inpatient psychiatric unit for safety, stabilization and treatment             -- Daily contact with patient to assess and evaluate symptoms and progress in treatment             -- Patient's case to be discussed in multi-disciplinary team meeting             -- Observation Level : q15 minute checks             -- Vital signs:  q12 hours             -- Precautions: suicide, elopement, and assault   2. Psychiatric Diagnoses and Treatment:              Alcohol Induced Mood Disorder              -- Continue Seroquel  100 mg po Q hs.              -- Continue Risperdal M-tab 1 mg po bid for psychosis              -- Continue BH agitation protocol --  Hydroxyzine  25 mg every 6 hours PRN, anxiety     --  The risks/benefits/side-effects/alternatives to this medication were discussed in detail with the patient and time was given for questions. The patient consents to medication trial.  -- FDA             -- Metabolic profile and EKG monitoring obtained while on an atypical antipsychotic (BMI: Lipid Panel: HbgA1c: QTc:)              -- Encouraged patient to participate in unit milieu and in scheduled group therapies              -- Short Term Goals: Ability to identify changes in lifestyle to reduce recurrence of condition will improve, Ability to verbalize feelings will improve, Ability to disclose and discuss suicidal ideas, Ability to demonstrate self-control will improve, Ability to identify and develop effective coping behaviors will improve, Ability to maintain clinical measurements within normal limits will improve, Compliance with prescribed medications will improve, and  Ability to identify triggers associated with substance abuse/mental health issues will improve             -- Long Term Goals: Improvement in symptoms so as ready for discharge                3. Medical Issues Being Addressed:                Alcohol withdrawal              -- CIWA,              -- Continue Ativan  taper, Thiamine , MVI with minerals   Cellulitis of right hand              -- Continue Doxycycline  100 mg Q12 x 18 doses    GERD  -- Protonix  40 mg daily    HIV  -- Resume BIKTARVY  daily    Pancreatis  -- Resume CREON  three times daily before meals                Tobacco Use Disorder             -- Nicotine  patch 14 mg/24 hours ordered             -- Smoking cessation encouraged   Labs, EKG reviewed     4. Discharge Planning:              -- Social work and case management to assist with discharge planning and identification of hospital follow-up needs prior to discharge             -- Estimated LOS: 5-7 days             -- Discharge Concerns:  Need to establish a safety plan; Medication compliance and effectiveness             -- Discharge Goals: Return home with outpatient referrals for mental health follow-up including medication management/psychotherapy   Asuncion Layer, NP, pmhnp, fnp-bc. 04/19/2024, 5:02 PM

## 2024-04-19 NOTE — Progress Notes (Signed)
   04/19/24 0556  15 Minute Checks  Location Bedroom  Visual Appearance Calm  Behavior Composed  Sleep (Behavioral Health Patients Only)  Calculate sleep? (Click Yes once per 24 hr at 0600 safety check) Yes  Documented sleep last 24 hours 0

## 2024-04-19 NOTE — Progress Notes (Signed)
 1:1 Note: Patient maintained on constant supervision for safety.  Patient visible in milieu and pacing the hallway.  Medications given as prescribed.  Patient refused CIWA vital signs after several attempts.  Routine safety checks maintained.  Patient is safe on the unit.

## 2024-04-19 NOTE — Progress Notes (Signed)
 Conversation with patient:  CSW attempted to provide patient with a list of shelter resources, but he declined.  He was standing near the door with his paper bag, and requested to be discharged today.   Shannie Kontos, LCSWA 04/19/2024

## 2024-04-20 DIAGNOSIS — F23 Brief psychotic disorder: Principal | ICD-10-CM

## 2024-04-20 DIAGNOSIS — F1094 Alcohol use, unspecified with alcohol-induced mood disorder: Secondary | ICD-10-CM | POA: Diagnosis not present

## 2024-04-20 LAB — TECHNOLOGIST SMEAR REVIEW

## 2024-04-20 LAB — HEMOGLOBIN A1C
Hgb A1c MFr Bld: 4.8 % (ref 4.8–5.6)
Mean Plasma Glucose: 91 mg/dL

## 2024-04-20 LAB — CBC WITH DIFFERENTIAL/PLATELET
Abs Immature Granulocytes: 0.03 10*3/uL (ref 0.00–0.07)
Basophils Absolute: 0 10*3/uL (ref 0.0–0.1)
Basophils Relative: 1 %
Eosinophils Absolute: 0.1 10*3/uL (ref 0.0–0.5)
Eosinophils Relative: 3 %
HCT: 30.4 % — ABNORMAL LOW (ref 39.0–52.0)
Hemoglobin: 9.6 g/dL — ABNORMAL LOW (ref 13.0–17.0)
Immature Granulocytes: 1 %
Lymphocytes Relative: 30 %
Lymphs Abs: 0.8 10*3/uL (ref 0.7–4.0)
MCH: 31.2 pg (ref 26.0–34.0)
MCHC: 31.6 g/dL (ref 30.0–36.0)
MCV: 98.7 fL (ref 80.0–100.0)
Monocytes Absolute: 0.3 10*3/uL (ref 0.1–1.0)
Monocytes Relative: 9 %
Neutro Abs: 1.6 10*3/uL — ABNORMAL LOW (ref 1.7–7.7)
Neutrophils Relative %: 56 %
Platelets: 97 10*3/uL — ABNORMAL LOW (ref 150–400)
RBC: 3.08 MIL/uL — ABNORMAL LOW (ref 4.22–5.81)
RDW: 18.4 % — ABNORMAL HIGH (ref 11.5–15.5)
WBC: 2.8 10*3/uL — ABNORMAL LOW (ref 4.0–10.5)
nRBC: 0 % (ref 0.0–0.2)

## 2024-04-20 LAB — RETICULOCYTES
Immature Retic Fract: 17.5 % — ABNORMAL HIGH (ref 2.3–15.9)
RBC.: 3.07 MIL/uL — ABNORMAL LOW (ref 4.22–5.81)
Retic Count, Absolute: 69.7 10*3/uL (ref 19.0–186.0)
Retic Ct Pct: 2.3 % (ref 0.4–3.1)

## 2024-04-20 MED ORDER — RISPERIDONE 2 MG PO TBDP
2.0000 mg | ORAL_TABLET | Freq: Two times a day (BID) | ORAL | Status: DC
  Administered 2024-04-20 – 2024-04-25 (×9): 2 mg via ORAL
  Filled 2024-04-20 (×10): qty 1

## 2024-04-20 MED ORDER — ENSURE PLUS HIGH PROTEIN PO LIQD
237.0000 mL | Freq: Two times a day (BID) | ORAL | Status: DC
  Administered 2024-04-20 – 2024-04-25 (×11): 237 mL via ORAL
  Filled 2024-04-20 (×12): qty 237

## 2024-04-20 MED ORDER — QUETIAPINE FUMARATE 50 MG PO TABS
50.0000 mg | ORAL_TABLET | Freq: Every day | ORAL | Status: DC
  Administered 2024-04-20 – 2024-04-24 (×5): 50 mg via ORAL
  Filled 2024-04-20 (×5): qty 1

## 2024-04-20 NOTE — Progress Notes (Signed)
 Pt CIWA reassessed. Pt denies headache, but is still hallucinating visually and tactile. Disorganized speech. CIWA=8. Will continue to monitor.

## 2024-04-20 NOTE — Group Note (Signed)
 Recreation Therapy Group Note   Group Topic:General Recreation  Group Date: 04/20/2024 Start Time: 1000 End Time: 1050 Facilitators: Jnai Snellgrove-McCall, LRT,CTRS Location: [address] Dayroom   Group Topic/Focus: General Recreation   Goal Area(s) Addresses:  Patient will use appropriate interactions in engagement with peers.    Behavioral Response: Appropriate   Intervention: Free Play  Activity: Patients had the option of playing various games offered such as chess, checkers, UNO, cards or with a beach ball.      Affect/Mood: Appropriate   Participation Level: Minimal   Participation Quality: Independent   Behavior: Cooperative   Speech/Thought Process: Barely audible    Insight: Fair   Judgement: Fair    Modes of Intervention: Open Activities   Patient Response to Interventions:  Receptive   Education Outcome:  In group clarification offered    Clinical Observations/Individualized Feedback: Pt was bright and had some engagement with peers. Pt was slow to respond to questions but was hard to understand unless you were right near him. Pt was appropriate in group.     Plan: Continue to engage patient in RT group sessions 2-3x/week.   Naiomi Musto-McCall, LRT,CTRS 04/20/2024 11:44 AM

## 2024-04-20 NOTE — Progress Notes (Signed)
 [facility] MD Progress Note  04/20/2024 1:43 PM Gary [name]  MRN:  [id number]  Reason for admission: 38 year old male who presented to [facility] on April 16, 2024 intoxicated, experiencing alcohol withdrawal and expressing suicidal ideation.  He has a documented history of multiple alcohol-related ED visits.  He was admitted to the [facility] on April 18, 2024 for treatment and stabilization. He has a past medical history significant of alcohol induced pancreatitis, pancytopenia, HIV.  Chart review revealed 2 prior psychiatric admission in June 2020 and September 2022.  Psychiatric history includes major depressive disorder, severe alcohol abuse.   Daily notes: Gary [name] is seen. Chart reviewed. The chart findings will be discussed during the treatment team meeting this afternoon. He presents a bit clearer today. He is making a very intense eye contact & verbally responsive. His affect is reactive more than the previous days. He reports, It is going well with me. My mood is good, but I'm having a bad anxiety. The staff woke me up at 06:00 am this morning. I'm not used to waking up at that kind of time in the morning. My usual wake up time at my home is between 08:00 & 09:00 am. I like sleeping late in the morning. I'm very ready to be discharged from this hospital. Can I get discharged today? I want to go home because I will get more rest at home. I live with my brother. I think my anxiety is coming from me being here. Can I have my clothes washed? I need to bath as well. Patient seems to be showing much improvement in his mood. He is making concrete statements/requests. He is tolerating his treatment regimen as he appears to be in no apparent distress. He is moving in a much steady gait & able to balance without shaking. We briefly released him from 1:1 supervision to see how he does by the evening time. Reviewed current plan of care, there have been some changes made. His Seroquel  is now 50 mg  po Q hs & Risperdal is now 2 mg po bid. Vital signs remain stable. Continue current plan of care as already in progress.  Principal Problem: Brief psychotic disorder (HCC)  Diagnosis: Principal Problem:   Brief psychotic disorder (HCC) Active Problems:   HIV disease (HCC)   Alcohol use disorder, severe, dependence (HCC)   Alcohol-induced mood disorder (HCC)   Tobacco use disorder   Pancreatitis  Total Time spent with patient: 35 minutes  Past Psychiatric History: See H&P.  Past Medical History:  Past Medical History:  Diagnosis Date   Alcohol abuse    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   Anxiety    Bipolar 2 disorder (HCC)    HIV (human immunodeficiency virus infection) (HCC)    Pancreatitis    PTSD (post-traumatic stress disorder)    Schizophrenia (HCC)    Seizures (HCC)    Subdural hematoma (HCC)     Past Surgical History:  Procedure Laterality Date   BIOPSY  04/19/2022   Procedure: BIOPSY;  Surgeon: Normie Becton., MD;  Location: [facility] ENDOSCOPY;  Service: Gastroenterology;;   ENTEROSCOPY N/A 04/19/2022   Procedure: ENTEROSCOPY;  Surgeon: Normie Becton., MD;  Location: [facility] ENDOSCOPY;  Service: Gastroenterology;  Laterality: N/A;   INCISION AND DRAINAGE PERIRECTAL ABSCESS N/A 09/24/2016   Procedure: IRRIGATION AND DEBRIDEMENT PERIRECTAL ABSCESS;  Surgeon: Gwyndolyn Lerner, MD;  Location: [facility] ORS;  Service: General;  Laterality: N/A;   none     Family History:  Family History  Problem Relation Age of Onset   Hypertension Mother    Alcohol abuse Mother    Alcoholism Mother    Alcohol abuse Father    Colon cancer Other    Other Other    Cancer Other    Family Psychiatric  History: See H&P.  Social History:  Social History   Substance and Sexual Activity  Alcohol Use Yes   Alcohol/week: 105.0 standard drinks of alcohol   Types: 105 Cans of beer per week   Comment: drinks every day, all day, whatever I can get my hands on. Half a gallon of  vodka 12/16     Social History   Substance and Sexual Activity  Drug Use Not Currently   Types: Methamphetamines, Cocaine   Comment: last used 10/22/2023    Social History   Socioeconomic History   Marital status: Single    Spouse name: Not on file   Number of children: Not on file   Years of education: Not on file   Highest education level: Not on file  Occupational History   Occupation: unemployed  Tobacco Use   Smoking status: Every Day    Current packs/day: 0.50    Average packs/day: 0.5 packs/day for 21.4 years (10.7 ttl pk-yrs)    Types: Cigarettes    Start date: 2004   Smokeless tobacco: Never   Tobacco comments:    unable to smoke while incarcerated 6+ months 02/13/20  Vaping Use   Vaping status: Never Used  Substance and Sexual Activity   Alcohol use: Yes    Alcohol/week: 105.0 standard drinks of alcohol    Types: 105 Cans of beer per week    Comment: drinks every day, all day, whatever I can get my hands on. Half a gallon of vodka 12/16   Drug use: Not Currently    Types: Methamphetamines, Cocaine    Comment: last used 10/22/2023   Sexual activity: Yes    Partners: Male, Male    Comment: declined condoms 01/2024  Other Topics Concern   Not on file  Social History Narrative   Currently living on the streets   Independent at baseline   [facility]asionally goes to the [facility]   Social Drivers of Health   Financial Resource Strain: Patient Declined (10/11/2023)   Overall Financial Resource Strain (CARDIA)    Difficulty of Paying Living Expenses: Patient declined  Food Insecurity: Patient Declined (04/18/2024)   Hunger Vital Sign    Worried About Running Out of Food in the Last Year: Patient declined    Ran Out of Food in the Last Year: Patient declined  Recent Concern: Food Insecurity - Food Insecurity Present (02/19/2024)   Hunger Vital Sign    Worried About Running Out of Food in the Last Year: Often true    Ran Out of Food in the Last Year: Often true   Transportation Needs: Patient Declined (04/18/2024)   PRAPARE - Administrator, Civil Service (Medical): Patient declined    Lack of Transportation (Non-Medical): Patient declined  Recent Concern: Transportation Needs - Unmet Transportation Needs (02/19/2024)   PRAPARE - Transportation    Lack of Transportation (Medical): Yes    Lack of Transportation (Non-Medical): Yes  Physical Activity: Not on file  Stress: No Stress Concern Present (06/30/2022)   Received from [facility] - Occupational Stress Questionnaire    Feeling of Stress : Not at all  Recent Concern: Stress - Stress Concern Present (06/25/2022)  Received from [facility] - Occupational Stress Questionnaire    Feeling of Stress : Rather much  Social Connections: Patient Declined (04/18/2024)   Social Connection and Isolation Panel    Frequency of Communication with Friends and Family: Patient declined    Frequency of Social Gatherings with Friends and Family: Patient declined    Attends Religious Services: Patient declined    Database administrator or Organizations: Patient declined    Attends Banker Meetings: Patient declined    Marital Status: Patient declined  Recent Concern: Social Connections - Socially Isolated (02/03/2024)   Social Connection and Isolation Panel    Frequency of Communication with Friends and Family: Never    Frequency of Social Gatherings with Friends and Family: Never    Attends Religious Services: Never    Database administrator or Organizations: No    Attends Engineer, structural: Never    Marital Status: Never married   Additional Social History:   Sleep: Fair  Appetite:  Fair  Current Medications: Current Facility-Administered Medications  Medication Dose Route Frequency Provider Last Rate Last Admin   acetaminophen  (TYLENOL ) tablet 650 mg  650 mg Oral Q6H PRN  Onuoha, Josephine C, NP       alum & mag hydroxide-simeth (MAALOX/MYLANTA) 200-200-20 MG/5ML suspension 30 mL  30 mL Oral Q4H PRN Onuoha, Josephine C, NP       bictegravir-emtricitabine -tenofovir  AF (BIKTARVY ) 50-200-25 MG per tablet 1 tablet  1 tablet Oral Daily Onuoha, Josephine C, NP   1 tablet at 04/20/24 0825   haloperidol  (HALDOL ) tablet 5 mg  5 mg Oral TID PRN Onuoha, Josephine C, NP   5 mg at 04/18/24 2332   And   diphenhydrAMINE  (BENADRYL ) capsule 50 mg  50 mg Oral TID PRN Onuoha, Josephine C, NP   50 mg at 04/18/24 2332   haloperidol  lactate (HALDOL ) injection 5 mg  5 mg Intramuscular TID PRN Onuoha, Josephine C, NP       And   diphenhydrAMINE  (BENADRYL ) injection 50 mg  50 mg Intramuscular TID PRN Onuoha, Josephine C, NP       And   LORazepam  (ATIVAN ) injection 2 mg  2 mg Intramuscular TID PRN Onuoha, Josephine C, NP       haloperidol  lactate (HALDOL ) injection 10 mg  10 mg Intramuscular TID PRN Onuoha, Josephine C, NP       And   diphenhydrAMINE  (BENADRYL ) injection 50 mg  50 mg Intramuscular TID PRN Onuoha, Josephine C, NP       And   LORazepam  (ATIVAN ) injection 2 mg  2 mg Intramuscular TID PRN Onuoha, Josephine C, NP       doxycycline  (VIBRA -TABS) tablet 100 mg  100 mg Oral Q12H Onuoha, Josephine C, NP   100 mg at 04/20/24 0833   feeding supplement (ENSURE PLUS HIGH PROTEIN) liquid 237 mL  237 mL Oral BID BM Timmothy Foots, MD       hydrOXYzine  (ATARAX ) tablet 25 mg  25 mg Oral Q6H PRN Dorthea Gauze, NP   25 mg at 04/19/24 2052   lipase/protease/amylase (CREON ) capsule 24,000 Units  24,000 Units Oral TID AC Bennett, Christal H, NP   24,000 Units at 04/20/24 1201   loperamide  (IMODIUM ) capsule 2-4 mg  2-4 mg Oral PRN Dorthea Gauze, NP       LORazepam  (ATIVAN ) tablet 1 mg  1 mg Oral Q6H PRN Dorthea Gauze, NP   1 mg at  04/19/24 2256   LORazepam  (ATIVAN ) tablet 1 mg  1 mg Oral BID Dorthea Gauze, NP   1 mg at 04/20/24 1610   Followed by   Cecily Cohen ON 04/21/2024] LORazepam  (ATIVAN )  tablet 1 mg  1 mg Oral Daily Dorthea Gauze, NP       magnesium  hydroxide (MILK OF MAGNESIA) suspension 30 mL  30 mL Oral Daily PRN Onuoha, Josephine C, NP       multivitamin with minerals tablet 1 tablet  1 tablet Oral Daily Dorthea Gauze, NP   1 tablet at 04/20/24 9604   nicotine  (NICODERM CQ  - dosed in mg/24 hours) patch 14 mg  14 mg Transdermal Daily Bennett, Christal H, NP       ondansetron  (ZOFRAN -ODT) disintegrating tablet 4 mg  4 mg Oral Q6H PRN Dorthea Gauze, NP   4 mg at 04/18/24 0710   pantoprazole  (PROTONIX ) EC tablet 40 mg  40 mg Oral BID Onuoha, Josephine C, NP   40 mg at 04/20/24 0825   QUEtiapine  (SEROQUEL ) tablet 50 mg  50 mg Oral QHS Parker, Alvin S, MD       risperiDONE (RISPERDAL M-TABS) disintegrating tablet 2 mg  2 mg Oral BID Timmothy Foots, MD       thiamine  (Vitamin B-1) tablet 100 mg  100 mg Oral Daily Dorthea Gauze, NP   100 mg at 04/20/24 5409   Lab Results:  Results for orders placed or performed during the hospital encounter of 04/17/24 (from the past 48 hours)  Folate     Status: None   Collection Time: 04/18/24  6:47 PM  Result Value Ref Range   Folate 17.7 >5.9 ng/mL    Comment: Performed at [facility], [address], [place], [place] [address]  Hemoglobin A1c     Status: None   Collection Time: 04/18/24  6:47 PM  Result Value Ref Range   Hgb A1c MFr Bld 4.8 4.8 - 5.6 %    Comment: (NOTE)         Prediabetes: 5.7 - 6.4         Diabetes: >6.4         Glycemic control for adults with diabetes: <7.0    Mean Plasma Glucose 91 mg/dL    Comment: (NOTE) Performed At: [facility] [address] [place], [place] [id number] Pearlean Botts MD HY:[id number]   Lipid panel     Status: None   Collection Time: 04/18/24  6:47 PM  Result Value Ref Range   Cholesterol 162 0 - 200 mg/dL   Triglycerides 47 <952 mg/dL   HDL 87 >84 mg/dL   Total CHOL/HDL Ratio 1.9 RATIO   VLDL 9 0 - 40 mg/dL   LDL Cholesterol 66 0 - 99 mg/dL     Comment:        Total Cholesterol/HDL:CHD Risk Coronary Heart Disease Risk Table                     Men   Women  1/2 Average Risk   3.4   3.3  Average Risk       5.0   4.4  2 X Average Risk   9.6   7.1  3 X Average Risk  23.4   11.0        Use the calculated Patient Ratio above and the CHD Risk Table to determine the patient's CHD Risk.        ATP III CLASSIFICATION (LDL):  <100  mg/dL   Optimal  409-811  mg/dL   Near or Above                    Optimal  130-159  mg/dL   Borderline  914-782  mg/dL   High  >956     mg/dL   Very High Performed at [facility], [address] [place], [place] [address]   RPR     Status: None   Collection Time: 04/18/24  6:47 PM  Result Value Ref Range   RPR Ser Ql NON REACTIVE NON REACTIVE    Comment: Performed at [facility], [address] [place], [place] [address]  TSH     Status: None   Collection Time: 04/18/24  6:47 PM  Result Value Ref Range   TSH 2.899 0.350 - 4.500 uIU/mL    Comment: Performed by a 3rd Generation assay with a functional sensitivity of <=0.01 uIU/mL. Performed at [facility], [address], [place], [place] [address]   Vitamin B12     Status: None   Collection Time: 04/18/24  6:47 PM  Result Value Ref Range   Vitamin B-12 299 180 - 914 pg/mL    Comment: (NOTE) This assay is not validated for testing neonatal or myeloproliferative syndrome specimens for Vitamin B12 levels. Performed at [facility], [address] [place], [place] [address]   VITAMIN D 25 Hydroxy (Vit-D Deficiency, Fractures)     Status: None   Collection Time: 04/18/24  6:47 PM  Result Value Ref Range   Vit D, 25-Hydroxy 43.23 30 - 100 ng/mL    Comment: (NOTE) Vitamin D deficiency has been defined by the Institute of Medicine  and an Endocrine Society practice guideline as a level of serum 25-OH  vitamin D less than 20 ng/mL (1,2). The Endocrine Society went on to   further define vitamin D insufficiency as a level between 21 and 29  ng/mL (2).  1. IOM (Institute of Medicine). 2010. Dietary reference intakes for  calcium  and D. [place]: [employer]. 2. Holick MF, Binkley [place], Bischoff-Ferrari HA, et al. Evaluation,  treatment, and prevention of vitamin D deficiency: an Endocrine  Society clinical practice guideline, JCEM. 2011 Jul; 96(7): 1911-30.  Performed at [facility], [address], [place], [place] [address]    Blood Alcohol level:  Lab Results  Component Value Date   ETH 402 [facility]) 04/16/2024   ETH 115 (H) 03/31/2024   Metabolic Disorder Labs: Lab Results  Component Value Date   HGBA1C 4.8 04/18/2024   MPG 91 04/18/2024   MPG 96.8 05/25/2023   No results found for: PROLACTIN Lab Results  Component Value Date   CHOL 162 04/18/2024   TRIG 47 04/18/2024   HDL 87 04/18/2024   CHOLHDL 1.9 04/18/2024   VLDL 9 04/18/2024   LDLCALC 66 04/18/2024   LDLCALC 73 12/15/2022   Physical Findings: AIMS:  , ,  ,  ,    CIWA:  CIWA-Ar Total: 6 COWS:     Musculoskeletal: Strength & Muscle Tone: within normal limits Gait & Station: normal Patient leans: N/A  Psychiatric Specialty Exam:  Presentation  General Appearance:  Disheveled  Eye Contact: -- (Intense.)  Speech: Clear and Coherent; Normal Rate  Speech Volume: Normal  Handedness: Right   Mood and Affect  Mood: -- (Improving)  Affect: Congruent   Thought Process  Thought Processes: Goal Directed; Coherent  Descriptions  of Associations:Intact  Orientation:Full (Time, Place and Person)  Thought Content:Logical  History of Schizophrenia/Schizoaffective disorder:No data recorded Duration of Psychotic Symptoms:No data recorded Hallucinations:Hallucinations: None  Ideas of Reference:None  Suicidal Thoughts:Suicidal Thoughts: No  Homicidal Thoughts:Homicidal Thoughts: No   Sensorium  Memory: Immediate Good; Recent  Good; Remote Fair  Judgment: Fair  Insight: Fair   Art therapist  Concentration: Fair  Attention Span: Fair  Recall: Good  Fund of Knowledge: Fair  Language: Fair   Psychomotor Activity  Psychomotor Activity: Psychomotor Activity: Normal   Assets  Assets: Resilience; Social Support; Manufacturing systems engineer; Housing   Sleep  Sleep: Sleep: Good Number of Hours of Sleep: 8  Physical Exam: Physical Exam Vitals and nursing note reviewed.  HENT:     Head: Normocephalic.     Nose: Nose normal.     Mouth/Throat:     Pharynx: Oropharynx is clear.   Cardiovascular:     Rate and Rhythm: Normal rate.     Pulses: Normal pulses.  Pulmonary:     Effort: Pulmonary effort is normal.  Genitourinary:    Comments: Deferred.  Musculoskeletal:        General: Normal range of motion.     Cervical back: Normal range of motion.   Neurological:     Mental Status: He is alert.     Comments: Appears disoriented & disorganized.   Review of Systems  Constitutional:  Negative for chills, diaphoresis and fever.  HENT:  Negative for congestion and sore throat.   Respiratory:  Negative for cough, shortness of breath and wheezing.   Cardiovascular:  Negative for chest pain and palpitations.  Gastrointestinal:  Negative for abdominal pain, blood in stool, constipation, diarrhea, heartburn, melena, nausea and vomiting.  Musculoskeletal:  Negative for joint pain and myalgias.  Neurological:  Positive for weakness (Pt appears sluggish). Negative for dizziness, tingling, tremors, sensory change, speech change, focal weakness, seizures and headaches.  Psychiatric/Behavioral:  Positive for hallucinations and substance abuse.    Blood pressure 118/82, pulse 96, temperature 98.3 F (36.8 C), temperature source Oral, resp. rate 18, height 6' (1.829 m), weight 62.1 kg, SpO2 100%. Body mass index is 18.58 kg/m.  Treatment Plan Summary: Daily contact with patient to assess and  evaluate symptoms and progress in treatment and Medication management.   Diagnoses / Active Problems: Principal Problem:   Alcohol-induced mood disorder (HCC) Active Problems:   HIV disease (HCC)   Alcohol use disorder, severe, dependence (HCC)   Tobacco use disorder   Pancreatitis   PLAN: Safety and Monitoring:             -- Involuntary admission to inpatient psychiatric unit for safety, stabilization and treatment             -- Daily contact with patient to assess and evaluate symptoms and progress in treatment             -- Patient's case to be discussed in multi-disciplinary team meeting             -- Observation Level : q15 minute checks             -- Vital signs:  q12 hours             -- Precautions: suicide, elopement, and assault   2. Psychiatric Diagnoses and Treatment:              Alcohol Induced Mood Disorder              --  Continue Seroquel  50 mg po Q hs.              -- Continue Risperdal M-tab 2 mg po bid for psychosis              -- Continue BH agitation protocol -- Continue Hydroxyzine  25 mg every 6 hours PRN, anxiety.   --  The risks/benefits/side-effects/alternatives to this medication were discussed in detail with the patient and time was given for questions. The patient consents to medication trial.  -- FDA             -- Metabolic profile and EKG monitoring obtained while on an atypical antipsychotic (BMI: Lipid Panel: HbgA1c: QTc:)              -- Encouraged patient to participate in unit milieu and in scheduled group therapies              -- Short Term Goals: Ability to identify changes in lifestyle to reduce recurrence of condition will improve, Ability to verbalize feelings will improve, Ability to disclose and discuss suicidal ideas, Ability to demonstrate self-control will improve, Ability to identify and develop effective coping behaviors will improve, Ability to maintain clinical measurements within normal limits will improve, Compliance with  prescribed medications will improve, and Ability to identify triggers associated with substance abuse/mental health issues will improve             -- Long Term Goals: Improvement in symptoms so as ready for discharge                3. Medical Issues Being Addressed:                Alcohol withdrawal              -- CIWA,              -- Continue Ativan  taper, Thiamine , MVI with minerals   Cellulitis of right hand              -- Continue Doxycycline  100 mg Q12 x 18 doses    GERD  -- Protonix  40 mg daily    HIV  -- Resume BIKTARVY  daily    Pancreatis  -- Resume CREON  three times daily before meals                Tobacco Use Disorder             -- Nicotine  patch 14 mg/24 hours ordered             -- Smoking cessation encouraged   Labs, EKG reviewed     4. Discharge Planning:              -- Social work and case management to assist with discharge planning and identification of hospital follow-up needs prior to discharge             -- Estimated LOS: 5-7 days             -- Discharge Concerns: Need to establish a safety plan; Medication compliance and effectiveness             -- Discharge Goals: Return home with outpatient referrals for mental health follow-up including medication management/psychotherapy   Asuncion Layer, NP, pmhnp, fnp-bc. 04/20/2024, 1:43 PM Patient ID: Gary [name], male   DOB: 06/27/1986, 38 y.o.   MRN: [id number]

## 2024-04-20 NOTE — Plan of Care (Signed)
  Problem: Coping: Goal: Ability to verbalize frustrations and anger appropriately will improve Outcome: Progressing   Problem: Safety: Goal: Periods of time without injury will increase Outcome: Progressing   Problem: Education: Goal: Knowledge of [facility] Education information/materials will improve Outcome: Not Progressing   Problem: Activity: Goal: Interest or engagement in activities will improve Outcome: Not Progressing

## 2024-04-20 NOTE — [facility] Suicide Risk Assessment (Signed)
 [facility] INPATIENT:  Family/Significant Other Suicide Prevention Education  Suicide Prevention Education:  Education Completed; [name] (brother) [phone],  (name of family member/significant other) has been identified by the patient as the family member/significant other with whom the patient will be residing, and identified as the person(s) who will aid the patient in the event of a mental health crisis (suicidal ideations/suicide attempt).  With written consent from the patient, the family member/significant other has been provided the following suicide prevention education, prior to the and/or following the discharge of the patient.  Brother said that patient can come to his home upon discharge.  He said he works from 5 AM - 2 PM.  He could possibly pick up patient, depending when he is discharged.  His address is:  [address] [place], [place] [address] (CSW googled distance, it's 32.9 miles from the hospital).  He said there are no guns or weapons in the home.  He said patient doesn't have any guns or weapons and doesn't have access to guns or weapons.  He said he will secure medications and sharp objects (such as knives and scissors). He said he doesn't have any safety concerns about patient  coming to his home.  Brother said he speaks with patient on the phone and patient is doing good.  Brother didn't have anything to write with, so CSW emailed him resources to [email or web address].  The suicide prevention education provided includes the following: Suicide risk factors Suicide prevention and interventions National Suicide Hotline telephone number [facility] assessment telephone number [facility] Emergency Assistance 911 [facility] and/or Residential Mobile Crisis Unit telephone number  Request made of family/significant other to: Remove weapons (e.g., guns, rifles, knives), all items previously/currently identified as safety concern.   Remove drugs/medications  (over-the-counter, prescriptions, illicit drugs), all items previously/currently identified as a safety concern.  The family member/significant other verbalizes understanding of the suicide prevention education information provided.  The family member/significant other agrees to remove the items of safety concern listed above.  Carmaleta Youngers O Jaremy Nosal, LCSWA 04/20/2024, 5:34 PM

## 2024-04-20 NOTE — Progress Notes (Signed)
   04/20/24 2100  Psych Admission Type (Psych Patients Only)  Admission Status Involuntary  Psychosocial Assessment  Patient Complaints Anxiety;Irritability  Eye Contact Brief  Facial Expression Anxious;Flat;Sad  Affect Depressed;Irritable  Speech Argumentative  Interaction Needy  Motor Activity Slow  Appearance/Hygiene Disheveled  Behavior Characteristics Unwilling to participate  Mood Anxious;Irritable  Thought Process  Coherency Circumstantial  Content Blaming others  Delusions None reported or observed  Perception WDL  Hallucination None reported or observed  Judgment Limited  Confusion Mild  Danger to Self  Current suicidal ideation? Denies  Description of Suicide Plan none  Agreement Not to Harm Self Yes  Description of Agreement verbal  Danger to Others  Danger to Others None reported or observed

## 2024-04-20 NOTE — Plan of Care (Signed)
  Problem: Education: Goal: Emotional status will improve Outcome: Progressing Goal: Verbalization of understanding the information provided will improve Outcome: Progressing   Problem: Activity: Goal: Interest or engagement in activities will improve Outcome: Progressing Goal: Sleeping patterns will improve Outcome: Progressing

## 2024-04-20 NOTE — Plan of Care (Signed)
  Problem: Education: Goal: Emotional status will improve Outcome: Progressing   Problem: Safety: Goal: Periods of time without injury will increase Outcome: Progressing   

## 2024-04-20 NOTE — Progress Notes (Signed)
   04/20/24 0600  15 Minute Checks  Location Bedroom  Visual Appearance Calm  Behavior Composed  Sleep (Behavioral Health Patients Only)  Calculate sleep? (Click Yes once per 24 hr at 0600 safety check) Yes  OTHER  Documented sleep last 24 hours .25

## 2024-04-20 NOTE — Progress Notes (Signed)
   04/20/24 0900  Psych Admission Type (Psych Patients Only)  Admission Status Involuntary  Psychosocial Assessment  Patient Complaints Anxiety;Irritability  Eye Contact Fair  Facial Expression Anxious  Affect Anxious  Speech Tangential  Interaction Assertive  Motor Activity Slow;Tremors;Fidgety  Appearance/Hygiene Disheveled  Behavior Characteristics Cooperative;Anxious  Mood Anxious;Irritable  Thought Process  Coherency Circumstantial;Blocking  Content Preoccupation  Delusions None reported or observed  Perception Hallucinations  Hallucination Auditory;Visual  Judgment Limited  Confusion None  Danger to Self  Current suicidal ideation? Denies  Agreement Not to Harm Self Yes  Description of Agreement verbal  Danger to Others  Danger to Others None reported or observed

## 2024-04-20 NOTE — Group Note (Signed)
 Occupational Therapy Group Note  Group Topic: Sleep Hygiene  Group Date: 04/20/2024 Start Time: 1431 End Time: 1500 Facilitators: Lynnda Sas, OT   Group Description: Group encouraged increased participation and engagement through topic focused on sleep hygiene. Patients reflected on the quality of sleep they typically receive and identified areas that need improvement. Group was given background information on sleep and sleep hygiene, including common sleep disorders. Group members also received information on how to improve one's sleep and introduced a sleep diary as a tool that can be utilized to track sleep quality over a length of time. Group session ended with patients identifying one or more strategies they could utilize or implement into their sleep routine in order to improve overall sleep quality.        Therapeutic Goal(s):  Identify one or more strategies to improve overall sleep hygiene  Identify one or more areas of sleep that are negatively impacted (sleep too much, too little, etc)     Participation Level: Engaged   Participation Quality: Independent   Behavior: Calm and Cooperative   Speech/Thought Process: Loose association  and Tangential    Affect/Mood: Flat   Insight: Limited   Judgement: Limited      Modes of Intervention: Education  Patient Response to Interventions:  Attentive   Plan: Continue to engage patient in OT groups 2 - 3x/week.  04/20/2024  Lynnda Sas, OT   Gary [name], OT

## 2024-04-20 NOTE — [facility] Group Notes (Signed)
 Adult Psychoeducational Group Note  Date:  04/20/2024 Time:  9:02 PM  Group Topic/Focus:  Wrap-Up Group:   The focus of this group is to help patients review their daily goal of treatment and discuss progress on daily workbooks.  Participation Level:  Did Not Attend  Dal Dubin 04/20/2024, 9:02 PM

## 2024-04-20 NOTE — Progress Notes (Signed)
 1:1 Note:  Patient seated in dayroom alert and calm. No distress noted. Safety maintained 1:1 continued.

## 2024-04-20 NOTE — Progress Notes (Signed)
 Pt appears to be agitated this morning. I want to be discharged today. I'm going to be discharged today. At 7 o'clock, I need you to call the doctor. Pt on phone with his brother telling him that he is being discharged and to come get him. Staff spoke with brother and said that pt is not discharging today. Pt initially refused VS assessment but agreed with much encouragement. I refuse any medication this morning because I'm being held here against my will. Pt sitting on bench by the telephone. Not participating in assessment other than VS. Tremors can be seen in patient hands. Will continue to monitor. 1:1 sitter with patient.

## 2024-04-20 NOTE — Progress Notes (Signed)
 Nursing 1:1 note D:Pt observed laying in bed talking to himself. RR even and unlabored. No distress noted.  A: 1:1 observation continues for safety  R: Pt remains safe

## 2024-04-21 ENCOUNTER — Other Ambulatory Visit: Payer: Self-pay

## 2024-04-21 DIAGNOSIS — F1094 Alcohol use, unspecified with alcohol-induced mood disorder: Secondary | ICD-10-CM | POA: Diagnosis not present

## 2024-04-21 MED ORDER — GABAPENTIN 100 MG PO CAPS
200.0000 mg | ORAL_CAPSULE | Freq: Three times a day (TID) | ORAL | Status: DC
  Administered 2024-04-21 – 2024-04-25 (×13): 200 mg via ORAL
  Filled 2024-04-21 (×13): qty 2

## 2024-04-21 MED ORDER — LORAZEPAM 1 MG PO TABS
1.0000 mg | ORAL_TABLET | Freq: Once | ORAL | Status: AC
  Administered 2024-04-21: 1 mg via ORAL
  Filled 2024-04-21: qty 1

## 2024-04-21 NOTE — Plan of Care (Signed)
   Problem: Activity: Goal: Interest or engagement in activities will improve Outcome: Progressing   Problem: Coping: Goal: Ability to verbalize frustrations and anger appropriately will improve Outcome: Progressing

## 2024-04-21 NOTE — [facility] Group Notes (Signed)
 Adult Psychoeducational Group Note  Date:  04/21/2024 Time:  9:35 AM  Group Topic/Focus:  Goals Group:   The focus of this group is to help patients establish daily goals to achieve during treatment and discuss how the patient can incorporate goal setting into their daily lives to aide in recovery.  Participation LevelParticipation Quality:  na  Affect:  na  Cognitive:  na  Insight: na  Engagement in Group:  na  Modes of Intervention:  na  Additional Comments:    Onia Shiflett, Trude Furry patient did not attend. 04/21/2024, 9:35 AM

## 2024-04-21 NOTE — Progress Notes (Signed)
   04/21/24 1000  Psych Admission Type (Psych Patients Only)  Admission Status Involuntary  Psychosocial Assessment  Patient Complaints Anxiety;Irritability  Eye Contact Brief  Facial Expression Anxious;Sullen;Flat  Affect Depressed  Speech Logical/coherent  Interaction Needy  Motor Activity Slow  Appearance/Hygiene Disheveled  Behavior Characteristics Unwilling to participate  Mood Anxious;Irritable  Aggressive Behavior  Targets Family  Type of Behavior Verbal  Effect No apparent injury  Thought Process  Coherency Circumstantial  Content Blaming others  Delusions None reported or observed  Perception WDL  Hallucination None reported or observed  Judgment Limited  Confusion Mild  Danger to Self  Current suicidal ideation? Denies  Agreement Not to Harm Self Yes  Description of Agreement Verbal  Danger to Others  Danger to Others None reported or observed

## 2024-04-21 NOTE — Progress Notes (Signed)
   04/21/24 2100  Psych Admission Type (Psych Patients Only)  Admission Status Involuntary  Psychosocial Assessment  Patient Complaints Anxiety;Irritability  Eye Contact Fair  Facial Expression Anxious  Affect Anxious;Depressed  Speech Logical/coherent  Interaction Needy  Motor Activity Slow  Appearance/Hygiene Disheveled  Behavior Characteristics Unable to participate  Mood Anxious  Aggressive Behavior  Effect No apparent injury  Thought Process  Coherency Circumstantial  Content Blaming others  Delusions None reported or observed  Perception WDL  Hallucination None reported or observed  Judgment Limited  Confusion Mild  Danger to Self  Current suicidal ideation? Denies  Danger to Others  Danger to Others None reported or observed

## 2024-04-21 NOTE — Progress Notes (Signed)
 [facility] MD Progress Note  04/21/2024 7:14 PM Gary [name]  MRN:  [id number]  Reason for admission: 38 year old male who presented to [facility] on April 16, 2024 intoxicated, experiencing alcohol  withdrawal and expressing suicidal ideation.  He has a documented history of multiple alcohol -related ED visits.  He was admitted to the [facility] on April 18, 2024 for treatment and stabilization. He has a past medical history significant of alcohol  induced pancreatitis, pancytopenia, HIV.  Chart review revealed 2 prior psychiatric admission in June 2020 and September 2022.  Psychiatric history includes major depressive disorder, severe alcohol  abuse.   Daily notes: Gary [name] is seen. Chart reviewed. The chart findings discussed during the treatment team meeting this afternoon. Patient was lying down in bed during this evaluation. He presents quite clear today, making a concrete statements. He is not making much eye contact today. His affect is restricted. He reports, I'm not feeling good today. My withdrawal symptom is killing me. I'm having the shakes, bad shakes today. I'm burning up & freezing the same time. My anxiety is off the roof. I slept on & off last night. I'm not depressed but my anxiety is horrible. Although has much complaint today, Gary [name] currently denies any SIHI, AVH, delusional thoughts or paranoia. He does not appear to be responding to any internal stimuli during this evaluation. Reviewed current plan of care, there have been some changes made. Gary [name] is started on Gabapentin  300 mg po tid for substance withdrawal syndrome.. Vital signs remain stable. Continue current plan of care as already in progress.  Principal Problem: Brief psychotic disorder (HCC)  Diagnosis: Principal Problem:   Brief psychotic disorder (HCC) Active Problems:   HIV disease (HCC)   Alcohol  use disorder, severe, dependence (HCC)   Alcohol -induced mood disorder (HCC)   Tobacco use disorder    Pancreatitis  Total Time spent with patient: 35 minutes  Past Psychiatric History: See H&P.  Past Medical History:  Past Medical History:  Diagnosis Date   Alcohol  abuse    Alcohol -induced pancreatitis 04/16/2022   Anxiety    Bipolar 2 disorder (HCC)    HIV (human immunodeficiency virus infection) (HCC)    Pancreatitis    PTSD (post-traumatic stress disorder)    Schizophrenia (HCC)    Seizures (HCC)    Subdural hematoma (HCC)     Past Surgical History:  Procedure Laterality Date   BIOPSY  04/19/2022   Procedure: BIOPSY;  Surgeon: Normie Becton., MD;  Location: [facility] ENDOSCOPY;  Service: Gastroenterology;;   ENTEROSCOPY N/A 04/19/2022   Procedure: ENTEROSCOPY;  Surgeon: Normie Becton., MD;  Location: [facility] ENDOSCOPY;  Service: Gastroenterology;  Laterality: N/A;   INCISION AND DRAINAGE PERIRECTAL ABSCESS N/A 09/24/2016   Procedure: IRRIGATION AND DEBRIDEMENT PERIRECTAL ABSCESS;  Surgeon: Gwyndolyn Lerner, MD;  Location: [facility] ORS;  Service: General;  Laterality: N/A;   none     Family History:  Family History  Problem Relation Age of Onset   Hypertension Mother    Alcohol  abuse Mother    Alcoholism Mother    Alcohol  abuse Father    Colon cancer Other    Other Other    Cancer Other    Family Psychiatric  History: See H&P.  Social History:  Social History   Substance and Sexual Activity  Alcohol  Use Yes   Alcohol /week: 105.0 standard drinks of alcohol    Types: 105 Cans of beer per week   Comment: drinks every day, all day, whatever I can get my hands on. Half  a gallon of vodka 12/16     Social History   Substance and Sexual Activity  Drug Use Not Currently   Types: Methamphetamines, Cocaine   Comment: last used 10/22/2023    Social History   Socioeconomic History   Marital status: Single    Spouse name: Not on file   Number of children: Not on file   Years of education: Not on file   Highest education level: Not on file  Occupational History    Occupation: unemployed  Tobacco Use   Smoking status: Every Day    Current packs/day: 0.50    Average packs/day: 0.5 packs/day for 21.4 years (10.7 ttl pk-yrs)    Types: Cigarettes    Start date: 2004   Smokeless tobacco: Never   Tobacco comments:    unable to smoke while incarcerated 6+ months 02/13/20  Vaping Use   Vaping status: Never Used  Substance and Sexual Activity   Alcohol  use: Yes    Alcohol /week: 105.0 standard drinks of alcohol     Types: 105 Cans of beer per week    Comment: drinks every day, all day, whatever I can get my hands on. Half a gallon of vodka 12/16   Drug use: Not Currently    Types: Methamphetamines, Cocaine    Comment: last used 10/22/2023   Sexual activity: Yes    Partners: Male, Male    Comment: declined condoms 01/2024  Other Topics Concern   Not on file  Social History Narrative   Currently living on the streets   Independent at baseline   Occasionally goes to the [facility]   Social Drivers of Health   Financial Resource Strain: Patient Declined (10/11/2023)   Overall Financial Resource Strain (CARDIA)    Difficulty of Paying Living Expenses: Patient declined  Food Insecurity: Patient Declined (04/18/2024)   Hunger Vital Sign    Worried About Running Out of Food in the Last Year: Patient declined    Ran Out of Food in the Last Year: Patient declined  Recent Concern: Food Insecurity - Food Insecurity Present (02/19/2024)   Hunger Vital Sign    Worried About Running Out of Food in the Last Year: Often true    Ran Out of Food in the Last Year: Often true  Transportation Needs: Patient Declined (04/18/2024)   PRAPARE - Administrator, Civil Service (Medical): Patient declined    Lack of Transportation (Non-Medical): Patient declined  Recent Concern: Transportation Needs - Unmet Transportation Needs (02/19/2024)   PRAPARE - Transportation    Lack of Transportation (Medical): Yes    Lack of Transportation (Non-Medical): Yes   Physical Activity: Not on file  Stress: No Stress Concern Present (06/30/2022)   Received from [facility] of Occupational Health - Occupational Stress Questionnaire    Feeling of Stress : Not at all  Recent Concern: Stress - Stress Concern Present (06/25/2022)   Received from [facility] of Occupational Health - Occupational Stress Questionnaire    Feeling of Stress : Rather much  Social Connections: Patient Declined (04/18/2024)   Social Connection and Isolation Panel    Frequency of Communication with Friends and Family: Patient declined    Frequency of Social Gatherings with Friends and Family: Patient declined    Attends Religious Services: Patient declined    Database administrator or Organizations: Patient declined    Attends Banker Meetings: Patient declined    Marital Status: Patient declined  Recent  Concern: Social Connections - Socially Isolated (02/03/2024)   Social Connection and Isolation Panel    Frequency of Communication with Friends and Family: Never    Frequency of Social Gatherings with Friends and Family: Never    Attends Religious Services: Never    Database administrator or Organizations: No    Attends Engineer, structural: Never    Marital Status: Never married   Additional Social History:   Sleep: Fair  Appetite:  Fair  Current Medications: Current Facility-Administered Medications  Medication Dose Route Frequency Provider Last Rate Last Admin   acetaminophen  (TYLENOL ) tablet 650 mg  650 mg Oral Q6H PRN Onuoha, Josephine C, NP   650 mg at 04/21/24 1119   alum & mag hydroxide-simeth (MAALOX/MYLANTA) 200-200-20 MG/5ML suspension 30 mL  30 mL Oral Q4H PRN Onuoha, Josephine C, NP       bictegravir-emtricitabine -tenofovir  AF (BIKTARVY ) 50-200-25 MG per tablet 1 tablet  1 tablet Oral Daily Onuoha, Josephine C, NP   1 tablet at 04/21/24 1024   haloperidol  (HALDOL ) tablet 5 mg  5 mg Oral TID PRN  Onuoha, Josephine C, NP   5 mg at 04/18/24 2332   And   diphenhydrAMINE  (BENADRYL ) capsule 50 mg  50 mg Oral TID PRN Onuoha, Josephine C, NP   50 mg at 04/18/24 2332   haloperidol  lactate (HALDOL ) injection 5 mg  5 mg Intramuscular TID PRN Onuoha, Josephine C, NP       And   diphenhydrAMINE  (BENADRYL ) injection 50 mg  50 mg Intramuscular TID PRN Onuoha, Josephine C, NP       And   LORazepam  (ATIVAN ) injection 2 mg  2 mg Intramuscular TID PRN Onuoha, Josephine C, NP       haloperidol  lactate (HALDOL ) injection 10 mg  10 mg Intramuscular TID PRN Onuoha, Josephine C, NP       And   diphenhydrAMINE  (BENADRYL ) injection 50 mg  50 mg Intramuscular TID PRN Onuoha, Josephine C, NP       And   LORazepam  (ATIVAN ) injection 2 mg  2 mg Intramuscular TID PRN Onuoha, Josephine C, NP       doxycycline  (VIBRA -TABS) tablet 100 mg  100 mg Oral Q12H Onuoha, Josephine C, NP   100 mg at 04/21/24 1020   feeding supplement (ENSURE PLUS HIGH PROTEIN) liquid 237 mL  237 mL Oral BID BM Parker, Alvin S, MD   237 mL at 04/21/24 1619   gabapentin  (NEURONTIN ) capsule 200 mg  200 mg Oral TID Maynor Mwangi I, NP   200 mg at 04/21/24 1619   lipase/protease/amylase (CREON ) capsule 24,000 Units  24,000 Units Oral TID AC Bennett, Christal H, NP   24,000 Units at 04/21/24 1803   magnesium  hydroxide (MILK OF MAGNESIA) suspension 30 mL  30 mL Oral Daily PRN Onuoha, Josephine C, NP       multivitamin with minerals tablet 1 tablet  1 tablet Oral Daily Dorthea Gauze, NP   1 tablet at 04/21/24 1020   nicotine  (NICODERM CQ  - dosed in mg/24 hours) patch 14 mg  14 mg Transdermal Daily Bennett, Christal H, NP   14 mg at 04/21/24 1021   pantoprazole  (PROTONIX ) EC tablet 40 mg  40 mg Oral BID Onuoha, Josephine C, NP   40 mg at 04/21/24 1804   QUEtiapine  (SEROQUEL ) tablet 50 mg  50 mg Oral QHS Parker, Alvin S, MD   50 mg at 04/20/24 2022   risperiDONE  (RISPERDAL  M-TABS) disintegrating tablet 2 mg  2  mg Oral BID Parker, Alvin S, MD   2 mg at  04/21/24 1020   thiamine  (Vitamin B-1) tablet 100 mg  100 mg Oral Daily Dorthea Gauze, NP   100 mg at 04/21/24 1020   Lab Results:  Results for orders placed or performed during the hospital encounter of 04/17/24 (from the past 48 hours)  Reticulocytes     Status: Abnormal   Collection Time: 04/20/24  6:40 PM  Result Value Ref Range   Retic Ct Pct 2.3 0.4 - 3.1 %   RBC. 3.07 (L) 4.22 - 5.81 MIL/uL   Retic Count, Absolute 69.7 19.0 - 186.0 K/uL   Immature Retic Fract 17.5 (H) 2.3 - 15.9 %    Comment: Performed at [facility], [address], [place], [place] [address]  CBC with Differential/Platelet     Status: Abnormal   Collection Time: 04/20/24  6:40 PM  Result Value Ref Range   WBC 2.8 (L) 4.0 - 10.5 K/uL   RBC 3.08 (L) 4.22 - 5.81 MIL/uL   Hemoglobin 9.6 (L) 13.0 - 17.0 g/dL   HCT 57.8 (L) 46.9 - 62.9 %   MCV 98.7 80.0 - 100.0 fL   MCH 31.2 26.0 - 34.0 pg   MCHC 31.6 30.0 - 36.0 g/dL   RDW 52.8 (H) 41.3 - 24.4 %   Platelets 97 (L) 150 - 400 K/uL    Comment: Immature Platelet Fraction may be clinically indicated, consider ordering this additional test [id number] REPEATED TO VERIFY    nRBC 0.0 0.0 - 0.2 %   Neutrophils Relative % 56 %   Neutro Abs 1.6 (L) 1.7 - 7.7 K/uL   Lymphocytes Relative 30 %   Lymphs Abs 0.8 0.7 - 4.0 K/uL   Monocytes Relative 9 %   Monocytes Absolute 0.3 0.1 - 1.0 K/uL   Eosinophils Relative 3 %   Eosinophils Absolute 0.1 0.0 - 0.5 K/uL   Basophils Relative 1 %   Basophils Absolute 0.0 0.0 - 0.1 K/uL   Immature Granulocytes 1 %   Abs Immature Granulocytes 0.03 0.00 - 0.07 K/uL    Comment: Performed at [facility], [address] [place], [place] [address]  Technologist smear review     Status: None   Collection Time: 04/20/24  6:40 PM  Result Value Ref Range   WBC MORPHOLOGY MORPHOLOGY UNREMARKABLE    RBC MORPHOLOGY MORPHOLOGY UNREMARKABLE    Plt Morphology MORPHOLOGY UNREMARKABLE     Comment:  Performed at [facility], [address], [place], [place] [address]   Blood Alcohol  level:  Lab Results  Component Value Date   ETH 402 [facility]) 04/16/2024   ETH 115 (H) 03/31/2024   Metabolic Disorder Labs: Lab Results  Component Value Date   HGBA1C 4.8 04/18/2024   MPG 91 04/18/2024   MPG 96.8 05/25/2023   No results found for: PROLACTIN Lab Results  Component Value Date   CHOL 162 04/18/2024   TRIG 47 04/18/2024   HDL 87 04/18/2024   CHOLHDL 1.9 04/18/2024   VLDL 9 04/18/2024   LDLCALC 66 04/18/2024   LDLCALC 73 12/15/2022   Physical Findings: AIMS:  , ,  ,  ,    CIWA:  CIWA-Ar Total: 6 COWS:     Musculoskeletal: Strength & Muscle Tone: within normal limits Gait & Station: normal Patient leans: N/A  Psychiatric Specialty Exam:  Presentation  General Appearance:  Disheveled  Eye Contact: -- (Intense.)  Speech: Clear and Coherent; Normal  Rate  Speech Volume: Normal  Handedness: Right   Mood and Affect  Mood: -- (Improving)  Affect: Congruent   Thought Process  Thought Processes: Goal Directed; Coherent  Descriptions of Associations:Intact  Orientation:Full (Time, Place and Person)  Thought Content:Logical  History of Schizophrenia/Schizoaffective disorder:No data recorded Duration of Psychotic Symptoms:No data recorded Hallucinations:No data recorded  Ideas of Reference:None  Suicidal Thoughts:No data recorded  Homicidal Thoughts:No data recorded   Sensorium  Memory: Immediate Good; Recent Good; Remote Fair  Judgment: Fair  Insight: Fair   Art therapist  Concentration: Fair  Attention Span: Fair  Recall: Good  Fund of Knowledge: Fair  Language: Fair   Psychomotor Activity  Psychomotor Activity: No data recorded   Assets  Assets: Resilience; Social Support; Manufacturing systems engineer; Housing   Sleep  Sleep: No data recorded  Physical Exam: Physical Exam Vitals and  nursing note reviewed.  HENT:     Head: Normocephalic.     Nose: Nose normal.     Mouth/Throat:     Pharynx: Oropharynx is clear.   Cardiovascular:     Rate and Rhythm: Normal rate.     Pulses: Normal pulses.  Pulmonary:     Effort: Pulmonary effort is normal.  Genitourinary:    Comments: Deferred.  Musculoskeletal:        General: Normal range of motion.     Cervical back: Normal range of motion.   Neurological:     Mental Status: He is alert.     Comments: Appears disoriented & disorganized.   Review of Systems  Constitutional:  Negative for chills, diaphoresis and fever.  HENT:  Negative for congestion and sore throat.   Respiratory:  Negative for cough, shortness of breath and wheezing.   Cardiovascular:  Negative for chest pain and palpitations.  Gastrointestinal:  Negative for abdominal pain, blood in stool, constipation, diarrhea, heartburn, melena, nausea and vomiting.  Musculoskeletal:  Negative for joint pain and myalgias.  Neurological:  Positive for weakness (Pt appears sluggish). Negative for dizziness, tingling, tremors, sensory change, speech change, focal weakness, seizures and headaches.  Psychiatric/Behavioral:  Positive for hallucinations and substance abuse.    Blood pressure 104/66, pulse 93, temperature 98.3 F (36.8 C), temperature source Oral, resp. rate 18, height 6' (1.829 m), weight 62.1 kg, SpO2 100%. Body mass index is 18.58 kg/m.  Treatment Plan Summary: Daily contact with patient to assess and evaluate symptoms and progress in treatment and Medication management.   Diagnoses / Active Problems: Principal Problem:   Alcohol -induced mood disorder (HCC) Active Problems:   HIV disease (HCC)   Alcohol  use disorder, severe, dependence (HCC)   Tobacco use disorder   Pancreatitis   PLAN: Safety and Monitoring:             -- Involuntary admission to inpatient psychiatric unit for safety, stabilization and treatment             -- Daily contact  with patient to assess and evaluate symptoms and progress in treatment             -- Patient's case to be discussed in multi-disciplinary team meeting             -- Observation Level : q15 minute checks             -- Vital signs:  q12 hours             -- Precautions: suicide, elopement, and assault   2. Psychiatric Diagnoses and Treatment:  Alcohol  Induced Mood Disorder              -- Continue Seroquel  50 mg po Q hs.              -- Continue Risperdal  M-tab 2 mg po bid for psychosis              -- Continue BH agitation protocol -- Continue Hydroxyzine  25 mg every 6 hours PRN, anxiety.  -- Initiated Gabapentin  3200 mg po tid for alcohol  withdrawal syndrome.  --  The risks/benefits/side-effects/alternatives to this medication were discussed in detail with the patient and time was given for questions. The patient consents to medication trial.  -- FDA             -- Metabolic profile and EKG monitoring obtained while on an atypical antipsychotic (BMI: Lipid Panel: HbgA1c: QTc:)              -- Encouraged patient to participate in unit milieu and in scheduled group therapies              -- Short Term Goals: Ability to identify changes in lifestyle to reduce recurrence of condition will improve, Ability to verbalize feelings will improve, Ability to disclose and discuss suicidal ideas, Ability to demonstrate self-control will improve, Ability to identify and develop effective coping behaviors will improve, Ability to maintain clinical measurements within normal limits will improve, Compliance with prescribed medications will improve, and Ability to identify triggers associated with substance abuse/mental health issues will improve             -- Long Term Goals: Improvement in symptoms so as ready for discharge                3. Medical Issues Being Addressed:                Alcohol  withdrawal              -- CIWA,              -- Continue Ativan  taper, Thiamine , MVI with  minerals   Cellulitis of right hand              -- Continue Doxycycline  100 mg Q12 x 18 doses    GERD  -- Protonix  40 mg daily    HIV  -- Resume BIKTARVY  daily    Pancreatis  -- Resume CREON  three times daily before meals                Tobacco Use Disorder             -- Nicotine  patch 14 mg/24 hours ordered             -- Smoking cessation encouraged   Labs, EKG reviewed     4. Discharge Planning:              -- Social work and case management to assist with discharge planning and identification of hospital follow-up needs prior to discharge             -- Estimated LOS: 5-7 days             -- Discharge Concerns: Need to establish a safety plan; Medication compliance and effectiveness             -- Discharge Goals: Return home with outpatient referrals for mental health follow-up including medication management/psychotherapy   Asuncion Layer, NP, pmhnp, fnp-bc. 04/21/2024, 7:14 PM Patient ID: Gary Baxter  [name], male   DOB: 01-02-1986, 38 y.o.   MRN: [id number] Patient ID: Gary [name], male   DOB: 1986/04/10, 38 y.o.   MRN: [id number]

## 2024-04-21 NOTE — Progress Notes (Signed)
   04/21/24 1630  Spiritual Encounters  Type of Visit Follow up  Care provided to: Patient   I followed up with Vernadine Golas regarding his need for clothing.  Lavin shared that he was feeling improvement and fewer symptoms following EtOH withdrawal.  I provided relational support and active listening. I left some clothes in his room and suggested he could swap them and to let staff know of futher needs.  Dotsie Gillette [name], M.Div [phone]

## 2024-04-21 NOTE — Plan of Care (Signed)
  Problem: Education: Goal: Emotional status will improve Outcome: Progressing   Problem: Activity: Goal: Interest or engagement in activities will improve Outcome: Progressing Goal: Sleeping patterns will improve Outcome: Progressing   Problem: Education: Goal: Mental status will improve Outcome: Not Progressing

## 2024-04-21 NOTE — Group Note (Signed)
 Date:  04/21/2024 Time:  8:26 PM  Group Topic/Focus:  Wrap-Up Group:   The focus of this group is to help patients review their daily goal of treatment and discuss progress on daily workbooks.    Participation Level:  Active  Participation Quality:  Appropriate  Affect:  Appropriate  Cognitive:  Appropriate  Insight: Appropriate  Engagement in Group:  Engaged  Modes of Intervention:  Education and Exploration  Additional Comments:  Patient attended and participated in group tonight. He reports that the best thing that happened for him today was eating and sleeping. He need to eat and sleep.  Eugena Herter Dacosta 04/21/2024, 8:26 PM

## 2024-04-21 NOTE — Group Note (Signed)
 Recreation Therapy Group Note   Group Topic:Problem Solving  Group Date: 04/21/2024 Start Time: 1010 End Time: 1035 Facilitators: Rynlee Lisbon-McCall, LRT,CTRS Location: [address] Dayroom   Group Topic: Problem Solving  Goal Area(s) Addresses:  Patient will effectively work in a team with other group members. Patient will verbalize importance of using appropriate problem solving techniques.   Behavioral Response:   Intervention: Worksheet  Activity: Dentist. Patients were given two worksheets of brain teasers. Patients got 15 minutes to complete the puzzles. Patients could work with each other if they chose to to figure out what each puzzle was. At the end of the 15 minutes, LRT would go over the answers with the group.     Education: Journalist, newspaper, Communication, Team Building  Education Outcome: Acknowledges understanding/In group clarification offered/Needs additional education.    Affect/Mood: N/A   Participation Level: Did not attend    Clinical Observations/Individualized Feedback:    Plan: Continue to engage patient in RT group sessions 2-3x/week.   Gary [name], LRT,CTRS 04/21/2024 1:25 PM

## 2024-04-22 DIAGNOSIS — F1094 Alcohol use, unspecified with alcohol-induced mood disorder: Secondary | ICD-10-CM | POA: Diagnosis not present

## 2024-04-22 MED ORDER — NICOTINE POLACRILEX 2 MG MT GUM
2.0000 mg | CHEWING_GUM | OROMUCOSAL | Status: AC | PRN
Start: 2024-04-22 — End: ?
  Administered 2024-04-22 – 2024-04-25 (×6): 2 mg via ORAL
  Filled 2024-04-22 (×2): qty 1

## 2024-04-22 NOTE — Plan of Care (Signed)
  Problem: Education: Goal: Emotional status will improve Outcome: Progressing   Problem: Activity: Goal: Sleeping patterns will improve Outcome: Progressing   Problem: Coping: Goal: Ability to verbalize frustrations and anger appropriately will improve Outcome: Progressing   Problem: Safety: Goal: Periods of time without injury will increase Outcome: Progressing

## 2024-04-22 NOTE — Group Note (Signed)
 Date:  04/22/2024 Time:  8:20 PM  Group Topic/Focus:  Wrap-Up Group:   The focus of this group is to help patients review their daily goal of treatment and discuss progress on daily workbooks.    Participation Level:  Active  Participation Quality:  Appropriate  Affect:  Appropriate  Cognitive:  Appropriate  Insight: Appropriate  Engagement in Group:  Engaged  Modes of Intervention:  Education and Exploration  Additional Comments:  Patient attended and participated in group tonight. He reports that he like his demeanor. He like how he treats people.  Eugena Herter Dacosta 04/22/2024, 8:20 PM

## 2024-04-22 NOTE — Progress Notes (Addendum)
 [facility] MD Progress Note  04/22/2024 4:22 PM Gary [name]  MRN:  [id number]  Reason for admission: 38 year old male who presented to [facility] on April 16, 2024 intoxicated, experiencing alcohol  withdrawal and expressing suicidal ideation.  He has a documented history of multiple alcohol -related ED visits.  He was admitted to the [facility] on April 18, 2024 for treatment and stabilization. He has a past medical history significant of alcohol  induced pancreatitis, pancytopenia, HIV.  Chart review revealed 2 prior psychiatric admission in June 2020 and September 2022.  Psychiatric history includes major depressive disorder, severe alcohol  abuse.   Daily notes: Gary [name] is seen outside his room today. Chart reviewed. He was sitting in the day room. He presents alert, oriented & making clear requests & statements. He is making a good eye contact. His affect is bright. He reports, I'm feeling better today. I'm not feeling as bad as I was yesterday. I slept okay last night. I'm still having some shakes & anxiety symptoms here & there, but nothing like those of yesterday. I have had my breakfast already. Can I have some Nicotine  gum to help me out because I'm craving cigarettes? That's all I need. Gary [name] currently denies any SIHI, AVH, delusional thoughts or paranoia. He does not appear to be responding to any internal stimuli during this evaluation. Reviewed current plan of care, there are not much changes to his treatment plan, other than the Gabapentin  300 mg po tid for substance withdrawal syndrome that started yesterday. Patient is tolerating it well. Started him on the nicorette gum on a prn basis. Vital signs remain stable. Continue current plan of care as already in progress.  Principal Problem: Brief psychotic disorder (HCC)  Diagnosis: Principal Problem:   Brief psychotic disorder (HCC) Active Problems:   HIV disease (HCC)   Alcohol  use disorder, severe, dependence (HCC)    Alcohol -induced mood disorder (HCC)   Tobacco use disorder   Pancreatitis  Total Time spent with patient: 35 minutes  Past Psychiatric History: See H&P.  Past Medical History:  Past Medical History:  Diagnosis Date   Alcohol  abuse    Alcohol -induced pancreatitis 04/16/2022   Anxiety    Bipolar 2 disorder (HCC)    HIV (human immunodeficiency virus infection) (HCC)    Pancreatitis    PTSD (post-traumatic stress disorder)    Schizophrenia (HCC)    Seizures (HCC)    Subdural hematoma (HCC)     Past Surgical History:  Procedure Laterality Date   BIOPSY  04/19/2022   Procedure: BIOPSY;  Surgeon: Normie Becton., MD;  Location: [facility] ENDOSCOPY;  Service: Gastroenterology;;   ENTEROSCOPY N/A 04/19/2022   Procedure: ENTEROSCOPY;  Surgeon: Normie Becton., MD;  Location: [facility] ENDOSCOPY;  Service: Gastroenterology;  Laterality: N/A;   INCISION AND DRAINAGE PERIRECTAL ABSCESS N/A 09/24/2016   Procedure: IRRIGATION AND DEBRIDEMENT PERIRECTAL ABSCESS;  Surgeon: Gwyndolyn Lerner, MD;  Location: [facility] ORS;  Service: General;  Laterality: N/A;   none     Family History:  Family History  Problem Relation Age of Onset   Hypertension Mother    Alcohol  abuse Mother    Alcoholism Mother    Alcohol  abuse Father    Colon cancer Other    Other Other    Cancer Other    Family Psychiatric  History: See H&P.  Social History:  Social History   Substance and Sexual Activity  Alcohol  Use Yes   Alcohol /week: 105.0 standard drinks of alcohol    Types: 105 Cans of beer  per week   Comment: drinks every day, all day, whatever I can get my hands on. Half a gallon of vodka 12/16     Social History   Substance and Sexual Activity  Drug Use Not Currently   Types: Methamphetamines, Cocaine   Comment: last used 10/22/2023    Social History   Socioeconomic History   Marital status: Single    Spouse name: Not on file   Number of children: Not on file   Years of education: Not on file    Highest education level: Not on file  Occupational History   Occupation: unemployed  Tobacco Use   Smoking status: Every Day    Current packs/day: 0.50    Average packs/day: 0.5 packs/day for 21.4 years (10.7 ttl pk-yrs)    Types: Cigarettes    Start date: 2004   Smokeless tobacco: Never   Tobacco comments:    unable to smoke while incarcerated 6+ months 02/13/20  Vaping Use   Vaping status: Never Used  Substance and Sexual Activity   Alcohol  use: Yes    Alcohol /week: 105.0 standard drinks of alcohol     Types: 105 Cans of beer per week    Comment: drinks every day, all day, whatever I can get my hands on. Half a gallon of vodka 12/16   Drug use: Not Currently    Types: Methamphetamines, Cocaine    Comment: last used 10/22/2023   Sexual activity: Yes    Partners: Male, Male    Comment: declined condoms 01/2024  Other Topics Concern   Not on file  Social History Narrative   Currently living on the streets   Independent at baseline   Occasionally goes to the [facility]   Social Drivers of Health   Financial Resource Strain: Patient Declined (10/11/2023)   Overall Financial Resource Strain (CARDIA)    Difficulty of Paying Living Expenses: Patient declined  Food Insecurity: Patient Declined (04/18/2024)   Hunger Vital Sign    Worried About Running Out of Food in the Last Year: Patient declined    Ran Out of Food in the Last Year: Patient declined  Recent Concern: Food Insecurity - Food Insecurity Present (02/19/2024)   Hunger Vital Sign    Worried About Running Out of Food in the Last Year: Often true    Ran Out of Food in the Last Year: Often true  Transportation Needs: Patient Declined (04/18/2024)   PRAPARE - Administrator, Civil Service (Medical): Patient declined    Lack of Transportation (Non-Medical): Patient declined  Recent Concern: Transportation Needs - Unmet Transportation Needs (02/19/2024)   PRAPARE - Transportation    Lack of Transportation  (Medical): Yes    Lack of Transportation (Non-Medical): Yes  Physical Activity: Not on file  Stress: No Stress Concern Present (06/30/2022)   Received from [facility] - Occupational Stress Questionnaire    Feeling of Stress : Not at all  Recent Concern: Stress - Stress Concern Present (06/25/2022)   Received from [facility] - Occupational Stress Questionnaire    Feeling of Stress : Rather much  Social Connections: Patient Declined (04/18/2024)   Social Connection and Isolation Panel    Frequency of Communication with Friends and Family: Patient declined    Frequency of Social Gatherings with Friends and Family: Patient declined    Attends Religious Services: Patient declined    Active Member of Clubs or Organizations: Patient declined  Attends Banker Meetings: Patient declined    Marital Status: Patient declined  Recent Concern: Social Connections - Socially Isolated (02/03/2024)   Social Connection and Isolation Panel    Frequency of Communication with Friends and Family: Never    Frequency of Social Gatherings with Friends and Family: Never    Attends Religious Services: Never    Database administrator or Organizations: No    Attends Engineer, structural: Never    Marital Status: Never married   Additional Social History:   Sleep: Fair  Appetite:  Fair  Current Medications: Current Facility-Administered Medications  Medication Dose Route Frequency Provider Last Rate Last Admin   acetaminophen  (TYLENOL ) tablet 650 mg  650 mg Oral Q6H PRN Onuoha, Josephine C, NP   650 mg at 04/21/24 1119   alum & mag hydroxide-simeth (MAALOX/MYLANTA) 200-200-20 MG/5ML suspension 30 mL  30 mL Oral Q4H PRN Onuoha, Josephine C, NP       bictegravir-emtricitabine -tenofovir  AF (BIKTARVY ) 50-200-25 MG per tablet 1 tablet  1 tablet Oral Daily Onuoha, Josephine C, NP   1 tablet at 04/22/24  0741   haloperidol  (HALDOL ) tablet 5 mg  5 mg Oral TID PRN Onuoha, Josephine C, NP   5 mg at 04/18/24 2332   And   diphenhydrAMINE  (BENADRYL ) capsule 50 mg  50 mg Oral TID PRN Onuoha, Josephine C, NP   50 mg at 04/18/24 2332   haloperidol  lactate (HALDOL ) injection 5 mg  5 mg Intramuscular TID PRN Onuoha, Josephine C, NP       And   diphenhydrAMINE  (BENADRYL ) injection 50 mg  50 mg Intramuscular TID PRN Onuoha, Josephine C, NP       And   LORazepam  (ATIVAN ) injection 2 mg  2 mg Intramuscular TID PRN Onuoha, Josephine C, NP       haloperidol  lactate (HALDOL ) injection 10 mg  10 mg Intramuscular TID PRN Onuoha, Josephine C, NP       And   diphenhydrAMINE  (BENADRYL ) injection 50 mg  50 mg Intramuscular TID PRN Onuoha, Josephine C, NP       And   LORazepam  (ATIVAN ) injection 2 mg  2 mg Intramuscular TID PRN Onuoha, Josephine C, NP       doxycycline  (VIBRA -TABS) tablet 100 mg  100 mg Oral Q12H Onuoha, Josephine C, NP   100 mg at 04/22/24 0741   feeding supplement (ENSURE PLUS HIGH PROTEIN) liquid 237 mL  237 mL Oral BID BM Parker, Alvin S, MD   237 mL at 04/22/24 1439   gabapentin  (NEURONTIN ) capsule 200 mg  200 mg Oral TID Nalleli Largent I, NP   200 mg at 04/22/24 1110   lipase/protease/amylase (CREON ) capsule 24,000 Units  24,000 Units Oral TID AC Bennett, Christal H, NP   24,000 Units at 04/22/24 1110   magnesium  hydroxide (MILK OF MAGNESIA) suspension 30 mL  30 mL Oral Daily PRN Onuoha, Josephine C, NP       multivitamin with minerals tablet 1 tablet  1 tablet Oral Daily Dorthea Gauze, NP   1 tablet at 04/22/24 0741   nicotine  (NICODERM CQ  - dosed in mg/24 hours) patch 14 mg  14 mg Transdermal Daily Bennett, Christal H, NP   14 mg at 04/22/24 0741   nicotine  polacrilex (NICORETTE) gum 2 mg  2 mg Oral PRN Taffie Eckmann I, NP       pantoprazole  (PROTONIX ) EC tablet 40 mg  40 mg Oral BID Onuoha, Josephine C, NP   40 mg  at 04/22/24 0741   QUEtiapine  (SEROQUEL ) tablet 50 mg  50 mg Oral QHS Parker,  Alvin S, MD   50 mg at 04/21/24 2033   risperiDONE  (RISPERDAL  M-TABS) disintegrating tablet 2 mg  2 mg Oral BID Parker, Alvin S, MD   2 mg at 04/22/24 0741   thiamine  (Vitamin B-1) tablet 100 mg  100 mg Oral Daily Dorthea Gauze, NP   100 mg at 04/22/24 0741   Lab Results:  Results for orders placed or performed during the hospital encounter of 04/17/24 (from the past 48 hours)  Reticulocytes     Status: Abnormal   Collection Time: 04/20/24  6:40 PM  Result Value Ref Range   Retic Ct Pct 2.3 0.4 - 3.1 %   RBC. 3.07 (L) 4.22 - 5.81 MIL/uL   Retic Count, Absolute 69.7 19.0 - 186.0 K/uL   Immature Retic Fract 17.5 (H) 2.3 - 15.9 %    Comment: Performed at [facility], [address] [place], [place] [address]  CBC with Differential/Platelet     Status: Abnormal   Collection Time: 04/20/24  6:40 PM  Result Value Ref Range   WBC 2.8 (L) 4.0 - 10.5 K/uL   RBC 3.08 (L) 4.22 - 5.81 MIL/uL   Hemoglobin 9.6 (L) 13.0 - 17.0 g/dL   HCT 78.2 (L) 95.6 - 21.3 %   MCV 98.7 80.0 - 100.0 fL   MCH 31.2 26.0 - 34.0 pg   MCHC 31.6 30.0 - 36.0 g/dL   RDW 08.6 (H) 57.8 - 46.9 %   Platelets 97 (L) 150 - 400 K/uL    Comment: Immature Platelet Fraction may be clinically indicated, consider ordering this additional test [id number] REPEATED TO VERIFY    nRBC 0.0 0.0 - 0.2 %   Neutrophils Relative % 56 %   Neutro Abs 1.6 (L) 1.7 - 7.7 K/uL   Lymphocytes Relative 30 %   Lymphs Abs 0.8 0.7 - 4.0 K/uL   Monocytes Relative 9 %   Monocytes Absolute 0.3 0.1 - 1.0 K/uL   Eosinophils Relative 3 %   Eosinophils Absolute 0.1 0.0 - 0.5 K/uL   Basophils Relative 1 %   Basophils Absolute 0.0 0.0 - 0.1 K/uL   Immature Granulocytes 1 %   Abs Immature Granulocytes 0.03 0.00 - 0.07 K/uL    Comment: Performed at [facility], [address] [place], [place] [address]  Technologist smear review     Status: None   Collection Time: 04/20/24  6:40 PM  Result Value Ref Range   WBC  MORPHOLOGY MORPHOLOGY UNREMARKABLE    RBC MORPHOLOGY MORPHOLOGY UNREMARKABLE    Plt Morphology MORPHOLOGY UNREMARKABLE     Comment: Performed at [facility], [address], [place], [place] [address]   Blood Alcohol  level:  Lab Results  Component Value Date   ETH 402 [facility]) 04/16/2024   ETH 115 (H) 03/31/2024   Metabolic Disorder Labs: Lab Results  Component Value Date   HGBA1C 4.8 04/18/2024   MPG 91 04/18/2024   MPG 96.8 05/25/2023   No results found for: PROLACTIN Lab Results  Component Value Date   CHOL 162 04/18/2024   TRIG 47 04/18/2024   HDL 87 04/18/2024   CHOLHDL 1.9 04/18/2024   VLDL 9 04/18/2024   LDLCALC 66 04/18/2024   LDLCALC 73 12/15/2022   Physical Findings: AIMS:  , ,  ,  ,    CIWA:  CIWA-Ar Total: 0 COWS:  Musculoskeletal: Strength & Muscle Tone: within normal limits Gait & Station: normal Patient leans: N/A  Psychiatric Specialty Exam:  Presentation  General Appearance:  Casual; Fairly Groomed  Eye Contact: Good  Speech: Clear and Coherent; Normal Rate  Speech Volume: Normal  Handedness: Right  Mood and Affect  Mood: -- (Some improvement today.)  Affect: Congruent  Thought Process  Thought Processes: Coherent; Linear  Descriptions of Associations:Intact  Orientation:Full (Time, Place and Person)  Thought Content:Logical  History of Schizophrenia/Schizoaffective disorder: NA  Duration of Psychotic Symptoms: NA  Hallucinations:Hallucinations: None  Ideas of Reference:None  Suicidal Thoughts:Suicidal Thoughts: No  Homicidal Thoughts:Homicidal Thoughts: No  Sensorium  Memory: Immediate Good; Recent Fair; Remote Fair  Judgment: -- (Improving)  Insight: Fair   Art therapist  Concentration: Good  Attention Span: Good  Recall: Fair  Fund of Knowledge: Fair  Language: Fair   Psychomotor Activity  Psychomotor Activity: No data recorded   Assets   Assets: Communication Skills; Desire for Improvement; Housing; Resilience; Social Support   Sleep  Sleep: Sleep: Fair Number of Hours of Sleep: 6   Physical Exam: Physical Exam Vitals and nursing note reviewed.  HENT:     Head: Normocephalic.     Nose: Nose normal.     Mouth/Throat:     Pharynx: Oropharynx is clear.   Cardiovascular:     Rate and Rhythm: Normal rate.     Pulses: Normal pulses.  Pulmonary:     Effort: Pulmonary effort is normal.  Genitourinary:    Comments: Deferred.  Musculoskeletal:        General: Normal range of motion.     Cervical back: Normal range of motion.   Neurological:     Mental Status: He is alert.     Comments: Appears disoriented & disorganized.   Review of Systems  Constitutional:  Negative for chills, diaphoresis and fever.  HENT:  Negative for congestion and sore throat.   Respiratory:  Negative for cough, shortness of breath and wheezing.   Cardiovascular:  Negative for chest pain and palpitations.  Gastrointestinal:  Negative for abdominal pain, blood in stool, constipation, diarrhea, heartburn, melena, nausea and vomiting.  Musculoskeletal:  Negative for joint pain and myalgias.  Neurological:  Positive for weakness (Pt appears sluggish). Negative for dizziness, tingling, tremors, sensory change, speech change, focal weakness, seizures and headaches.  Psychiatric/Behavioral:  Positive for hallucinations and substance abuse.    Blood pressure 106/69, pulse 86, temperature 98.2 F (36.8 C), temperature source Oral, resp. rate 18, height 6' (1.829 m), weight 62.1 kg, SpO2 100%. Body mass index is 18.58 kg/m.  Treatment Plan Summary: Daily contact with patient to assess and evaluate symptoms and progress in treatment and Medication management.   Diagnoses / Active Problems: Principal Problem: Alcohol -induced mood disorder (HCC)  Active Problems: HIV disease (HCC) Alcohol  use disorder, severe, dependence (HCC) Tobacco use  disorder Pancreatitis   PLAN: Safety and Monitoring:             -- Involuntary admission to inpatient psychiatric unit for safety, stabilization and treatment             -- Daily contact with patient to assess and evaluate symptoms and progress in treatment             -- Patient's case to be discussed in multi-disciplinary team meeting             -- Observation Level : q15 minute checks             --  Vital signs:  q12 hours             -- Precautions: suicide, elopement, and assault   2. Psychiatric Diagnoses and Treatment:              Alcohol  Induced Mood Disorder              -- Continue Seroquel  50 mg po Q hs.              -- Continue Risperdal  M-tab 2 mg po bid for psychosis              -- Continue BH agitation protocol -- Continue Hydroxyzine  25 mg every 6 hours PRN, anxiety.  -- Continue Gabapentin  3200 mg po tid for alcohol  withdrawal syndrome.  --  The risks/benefits/side-effects/alternatives to this medication were discussed in detail with the patient and time was given for questions. The patient consents to medication trial.  -- FDA             -- Metabolic profile and EKG monitoring obtained while on an atypical antipsychotic (BMI: Lipid Panel: HbgA1c: QTc:)              -- Encouraged patient to participate in unit milieu and in scheduled group therapies              -               3. Medical Issues Being Addressed:                Alcohol  withdrawal              -- CIWA,              --Completed the Ativan  taper, Thiamine , MVI with minerals   Cellulitis of right hand              -- Continue Doxycycline  100 mg Q12 x 18 doses    GERD  -- Protonix  40 mg daily    HIV  -- Resumed BIKTARVY  daily    Pancreatis  -- Resumed CREON  three times daily before meals                Tobacco Use Disorder             -- Nicotine  patch 14 mg/24 hours ordered             -- Smoking cessation encouraged   Labs, EKG reviewed     4. Discharge Planning:              --  Social work and case management to assist with discharge planning and identification of hospital follow-up needs prior to discharge             -- Estimated LOS: 5-7 days             -- Discharge Concerns: Need to establish a safety plan; Medication compliance and effectiveness             -- Discharge Goals: Return home with outpatient referrals for mental health follow-up including medication management/psychotherapy   Asuncion Layer, NP, pmhnp, fnp-bc. 04/22/2024, 4:22 PM Patient ID: Wilhelm Hansen, male   DOB: 1986/03/23, 38 y.o.   MRN: [id number] Patient ID: Wilhelm Hansen, male   DOB: Jul 16, 1986, 38 y.o.   MRN: [id number] Patient ID: Wilhelm Hansen, male   DOB: 1986-05-13, 38 y.o.   MRN: [id number]

## 2024-04-22 NOTE — Progress Notes (Signed)
 Patient observed in dayroom interacting with peers appropriately, dancing and watching tv. No distress noted. Denies any pain or discomfort.

## 2024-04-22 NOTE — Progress Notes (Signed)
   04/22/24 0900  Psych Admission Type (Psych Patients Only)  Admission Status Involuntary  Psychosocial Assessment  Patient Complaints Anxiety  Eye Contact Brief  Facial Expression Anxious;Flat  Affect Anxious  Speech Logical/coherent  Interaction Needy  Motor Activity Slow  Appearance/Hygiene Disheveled  Behavior Characteristics Unable to participate  Mood Anxious  Thought Process  Coherency Circumstantial  Content Blaming others  Delusions None reported or observed  Perception WDL  Hallucination None reported or observed  Judgment Limited  Confusion Mild  Danger to Self  Current suicidal ideation? Denies  Agreement Not to Harm Self Yes  Description of Agreement verbal  Danger to Others  Danger to Others None reported or observed

## 2024-04-23 DIAGNOSIS — F1094 Alcohol use, unspecified with alcohol-induced mood disorder: Secondary | ICD-10-CM | POA: Diagnosis not present

## 2024-04-23 MED ORDER — TRAZODONE HCL 50 MG PO TABS
50.0000 mg | ORAL_TABLET | Freq: Every evening | ORAL | Status: DC | PRN
  Administered 2024-04-23 – 2024-04-24 (×2): 50 mg via ORAL
  Filled 2024-04-23: qty 1

## 2024-04-23 NOTE — Progress Notes (Signed)
 [facility] MD Progress Note  04/23/2024 5:03 PM Gary [name]  MRN:  [id number]  Reason for admission: 38 year old male who presented to [facility] ED on April 16, 2024 intoxicated, experiencing alcohol  withdrawal and expressing suicidal ideation.  He has a documented history of multiple alcohol -related ED visits.  He was admitted to the [facility] on April 18, 2024 for treatment and stabilization. He has a past medical history significant of alcohol  induced pancreatitis, pancytopenia, HIV.  Chart review revealed 2 prior psychiatric admission in June 2020 and September 2022.  Psychiatric history includes major depressive disorder, severe alcohol  abuse.   Daily notes: Tyshaun is seen outside his room again today. Chart reviewed. He was sitting in the day room. He presents alert, oriented & making clear requests & statements. He is making a good eye contact. His affect is bright. He was smiling. He reports, I'm doing great. I have no anxiety today. I'm not depressed. I have been going to the groups. I feel great. I'm wondering when can I get discharged? I slept well last night Blakely currently denies any SIHI, AVH, delusional thoughts or paranoia. He does not appear to be responding to any internal stimuli. Reviewed current plan of care, there are no changes to his treatment plan. He presents casually dressed & groomed. No foul body odor present. Patient may be approaching his baseline. He may be ready to be discharged by Tuesday if not tomorrow.  Vital signs remain stable. Continue current plan of care as already in progress.  Principal Problem: Brief psychotic disorder (HCC)  Diagnosis: Principal Problem:   Brief psychotic disorder (HCC) Active Problems:   HIV disease (HCC)   Alcohol  use disorder, severe, dependence (HCC)   Alcohol -induced mood disorder (HCC)   Tobacco use disorder   Pancreatitis  Total Time spent with patient: 35 minutes  Past Psychiatric History: See H&P.  Past Medical  History:  Past Medical History:  Diagnosis Date   Alcohol  abuse    Alcohol -induced pancreatitis 04/16/2022   Anxiety    Bipolar 2 disorder (HCC)    HIV (human immunodeficiency virus infection) (HCC)    Pancreatitis    PTSD (post-traumatic stress disorder)    Schizophrenia (HCC)    Seizures (HCC)    Subdural hematoma (HCC)     Past Surgical History:  Procedure Laterality Date   BIOPSY  04/19/2022   Procedure: BIOPSY;  Surgeon: Normie Becton., MD;  Location: [facility] ENDOSCOPY;  Service: Gastroenterology;;   ENTEROSCOPY N/A 04/19/2022   Procedure: ENTEROSCOPY;  Surgeon: Normie Becton., MD;  Location: [facility] ENDOSCOPY;  Service: Gastroenterology;  Laterality: N/A;   INCISION AND DRAINAGE PERIRECTAL ABSCESS N/A 09/24/2016   Procedure: IRRIGATION AND DEBRIDEMENT PERIRECTAL ABSCESS;  Surgeon: Gwyndolyn Lerner, MD;  Location: [facility] ORS;  Service: General;  Laterality: N/A;   none     Family History:  Family History  Problem Relation Age of Onset   Hypertension Mother    Alcohol  abuse Mother    Alcoholism Mother    Alcohol  abuse Father    Colon cancer Other    Other Other    Cancer Other    Family Psychiatric  History: See H&P.  Social History:  Social History   Substance and Sexual Activity  Alcohol  Use Yes   Alcohol /week: 105.0 standard drinks of alcohol    Types: 105 Cans of beer per week   Comment: drinks every day, all day, whatever I can get my hands on. Half a gallon of vodka 12/16  Social History   Substance and Sexual Activity  Drug Use Not Currently   Types: Methamphetamines, Cocaine   Comment: last used 10/22/2023    Social History   Socioeconomic History   Marital status: Single    Spouse name: Not on file   Number of children: Not on file   Years of education: Not on file   Highest education level: Not on file  Occupational History   Occupation: unemployed  Tobacco Use   Smoking status: Every Day    Current packs/day: 0.50    Average  packs/day: 0.5 packs/day for 21.5 years (10.7 ttl pk-yrs)    Types: Cigarettes    Start date: 2004   Smokeless tobacco: Never   Tobacco comments:    unable to smoke while incarcerated 6+ months 02/13/20  Vaping Use   Vaping status: Never Used  Substance and Sexual Activity   Alcohol  use: Yes    Alcohol /week: 105.0 standard drinks of alcohol     Types: 105 Cans of beer per week    Comment: drinks every day, all day, whatever I can get my hands on. Half a gallon of vodka 12/16   Drug use: Not Currently    Types: Methamphetamines, Cocaine    Comment: last used 10/22/2023   Sexual activity: Yes    Partners: Male, Male    Comment: declined condoms 01/2024  Other Topics Concern   Not on file  Social History Narrative   Currently living on the streets   Independent at baseline   Occasionally goes to the [facility]   Social Drivers of Health   Financial Resource Strain: Patient Declined (10/11/2023)   Overall Financial Resource Strain (CARDIA)    Difficulty of Paying Living Expenses: Patient declined  Food Insecurity: Patient Declined (04/18/2024)   Hunger Vital Sign    Worried About Running Out of Food in the Last Year: Patient declined    Ran Out of Food in the Last Year: Patient declined  Recent Concern: Food Insecurity - Food Insecurity Present (02/19/2024)   Hunger Vital Sign    Worried About Running Out of Food in the Last Year: Often true    Ran Out of Food in the Last Year: Often true  Transportation Needs: Patient Declined (04/18/2024)   PRAPARE - Administrator, Civil Service (Medical): Patient declined    Lack of Transportation (Non-Medical): Patient declined  Recent Concern: Transportation Needs - Unmet Transportation Needs (02/19/2024)   PRAPARE - Transportation    Lack of Transportation (Medical): Yes    Lack of Transportation (Non-Medical): Yes  Physical Activity: Not on file  Stress: No Stress Concern Present (06/30/2022)   Received from [facility] of Occupational Health - Occupational Stress Questionnaire    Feeling of Stress : Not at all  Recent Concern: Stress - Stress Concern Present (06/25/2022)   Received from [facility] of Occupational Health - Occupational Stress Questionnaire    Feeling of Stress : Rather much  Social Connections: Patient Declined (04/18/2024)   Social Connection and Isolation Panel    Frequency of Communication with Friends and Family: Patient declined    Frequency of Social Gatherings with Friends and Family: Patient declined    Attends Religious Services: Patient declined    Active Member of Clubs or Organizations: Patient declined    Attends Banker Meetings: Patient declined    Marital Status: Patient declined  Recent Concern: Social Connections - Socially Isolated (02/03/2024)  Social Advertising account executive    Frequency of Communication with Friends and Family: Never    Frequency of Social Gatherings with Friends and Family: Never    Attends Religious Services: Never    Diplomatic Services operational officer: No    Attends Engineer, structural: Never    Marital Status: Never married   Additional Social History:   Sleep: Fair  Appetite:  Fair  Current Medications: Current Facility-Administered Medications  Medication Dose Route Frequency Provider Last Rate Last Admin   acetaminophen  (TYLENOL ) tablet 650 mg  650 mg Oral Q6H PRN Onuoha, Josephine C, NP   650 mg at 04/21/24 1119   alum & mag hydroxide-simeth (MAALOX/MYLANTA) 200-200-20 MG/5ML suspension 30 mL  30 mL Oral Q4H PRN Onuoha, Josephine C, NP       bictegravir-emtricitabine -tenofovir  AF (BIKTARVY ) 50-200-25 MG per tablet 1 tablet  1 tablet Oral Daily Onuoha, Josephine C, NP   1 tablet at 04/23/24 0818   haloperidol  (HALDOL ) tablet 5 mg  5 mg Oral TID PRN Onuoha, Josephine C, NP   5 mg at 04/18/24 2332   And   diphenhydrAMINE  (BENADRYL ) capsule 50 mg  50 mg Oral TID  PRN Onuoha, Josephine C, NP   50 mg at 04/18/24 2332   haloperidol  lactate (HALDOL ) injection 5 mg  5 mg Intramuscular TID PRN Onuoha, Josephine C, NP       And   diphenhydrAMINE  (BENADRYL ) injection 50 mg  50 mg Intramuscular TID PRN Onuoha, Josephine C, NP       And   LORazepam  (ATIVAN ) injection 2 mg  2 mg Intramuscular TID PRN Onuoha, Josephine C, NP       haloperidol  lactate (HALDOL ) injection 10 mg  10 mg Intramuscular TID PRN Onuoha, Josephine C, NP       And   diphenhydrAMINE  (BENADRYL ) injection 50 mg  50 mg Intramuscular TID PRN Onuoha, Josephine C, NP       And   LORazepam  (ATIVAN ) injection 2 mg  2 mg Intramuscular TID PRN Onuoha, Josephine C, NP       doxycycline  (VIBRA -TABS) tablet 100 mg  100 mg Oral Q12H Onuoha, Josephine C, NP   100 mg at 04/23/24 0818   feeding supplement (ENSURE PLUS HIGH PROTEIN) liquid 237 mL  237 mL Oral BID BM Parker, Alvin S, MD   237 mL at 04/23/24 1603   gabapentin  (NEURONTIN ) capsule 200 mg  200 mg Oral TID Udell Blasingame I, NP   200 mg at 04/23/24 1604   lipase/protease/amylase (CREON ) capsule 24,000 Units  24,000 Units Oral TID AC Bennett, Christal H, NP   24,000 Units at 04/23/24 1202   magnesium  hydroxide (MILK OF MAGNESIA) suspension 30 mL  30 mL Oral Daily PRN Onuoha, Josephine C, NP       multivitamin with minerals tablet 1 tablet  1 tablet Oral Daily Dorthea Gauze, NP   1 tablet at 04/23/24 0818   nicotine  (NICODERM CQ  - dosed in mg/24 hours) patch 14 mg  14 mg Transdermal Daily Bennett, Christal H, NP   14 mg at 04/22/24 0741   nicotine  polacrilex (NICORETTE) gum 2 mg  2 mg Oral PRN Novah Nessel I, NP   2 mg at 04/23/24 0823   pantoprazole  (PROTONIX ) EC tablet 40 mg  40 mg Oral BID Onuoha, Josephine C, NP   40 mg at 04/23/24 1604   QUEtiapine  (SEROQUEL ) tablet 50 mg  50 mg Oral QHS Timmothy Foots, MD   50  mg at 04/22/24 2048   risperiDONE  (RISPERDAL  M-TABS) disintegrating tablet 2 mg  2 mg Oral BID Parker, Alvin S, MD   2 mg at 04/23/24 1604    thiamine  (Vitamin B-1) tablet 100 mg  100 mg Oral Daily Dorthea Gauze, NP   100 mg at 04/23/24 1478   Lab Results:  No results found for this or any previous visit (from the past 48 hours).  Blood Alcohol  level:  Lab Results  Component Value Date   ETH 402 (HH) 04/16/2024   ETH 115 (H) 03/31/2024   Metabolic Disorder Labs: Lab Results  Component Value Date   HGBA1C 4.8 04/18/2024   MPG 91 04/18/2024   MPG 96.8 05/25/2023   No results found for: PROLACTIN Lab Results  Component Value Date   CHOL 162 04/18/2024   TRIG 47 04/18/2024   HDL 87 04/18/2024   CHOLHDL 1.9 04/18/2024   VLDL 9 04/18/2024   LDLCALC 66 04/18/2024   LDLCALC 73 12/15/2022   Physical Findings: AIMS:  , ,  ,  ,    CIWA:  CIWA-Ar Total: 0 COWS:     Musculoskeletal: Strength & Muscle Tone: within normal limits Gait & Station: normal Patient leans: N/A  Psychiatric Specialty Exam:  Presentation  General Appearance:  Casual; Fairly Groomed  Eye Contact: Good  Speech: Clear and Coherent; Normal Rate  Speech Volume: Normal  Handedness: Right  Mood and Affect  Mood: -- (Some improvement today.)  Affect: Congruent  Thought Process  Thought Processes: Coherent; Linear  Descriptions of Associations:Intact  Orientation:Full (Time, Place and Person)  Thought Content:Logical  History of Schizophrenia/Schizoaffective disorder: NA  Duration of Psychotic Symptoms: NA  Hallucinations:Hallucinations: None  Ideas of Reference:None  Suicidal Thoughts:Suicidal Thoughts: No  Homicidal Thoughts:Homicidal Thoughts: No  Sensorium  Memory: Immediate Good; Recent Fair; Remote Fair  Judgment: -- (Improving)  Insight: Fair   Art therapist  Concentration: Good  Attention Span: Good  Recall: [employer] of Knowledge: Fair  Language: Fair   Psychomotor Activity  Psychomotor Activity: No data recorded   Assets  Assets: Communication Skills; Desire for  Improvement; Housing; Resilience; Social Support   Sleep  Sleep: Sleep: Good Number of Hours of Sleep: 8   Physical Exam: Physical Exam Vitals and nursing note reviewed.  HENT:     Head: Normocephalic.     Nose: Nose normal.     Mouth/Throat:     Pharynx: Oropharynx is clear.   Cardiovascular:     Rate and Rhythm: Normal rate.     Pulses: Normal pulses.  Pulmonary:     Effort: Pulmonary effort is normal.  Genitourinary:    Comments: Deferred.  Musculoskeletal:        General: Normal range of motion.     Cervical back: Normal range of motion.   Neurological:     General: No focal deficit present.     Mental Status: He is alert and oriented to person, place, and time.     Comments: Appears disoriented & disorganized.   Review of Systems  Constitutional:  Negative for chills, diaphoresis and fever.  HENT:  Negative for congestion and sore throat.   Respiratory:  Negative for cough, shortness of breath and wheezing.   Cardiovascular:  Negative for chest pain and palpitations.  Gastrointestinal:  Negative for abdominal pain, blood in stool, constipation, diarrhea, heartburn, melena, nausea and vomiting.  Musculoskeletal:  Negative for joint pain and myalgias.  Neurological:  Positive for weakness (Pt appears sluggish). Negative for dizziness,  tingling, tremors, sensory change, speech change, focal weakness, seizures and headaches.  Psychiatric/Behavioral:  Positive for substance abuse. Negative for hallucinations, memory loss and suicidal ideas. The patient is not nervous/anxious and does not have insomnia.    Blood pressure 110/70, pulse 96, temperature 98 F (36.7 C), temperature source Oral, resp. rate 18, height 6' (1.829 m), weight 62.1 kg, SpO2 100%. Body mass index is 18.58 kg/m.  Treatment Plan Summary: Daily contact with patient to assess and evaluate symptoms and progress in treatment and Medication management.   Diagnoses / Active Problems: Principal  Problem: Alcohol -induced mood disorder (HCC)  Active Problems: HIV disease (HCC) Alcohol  use disorder, severe, dependence (HCC) Tobacco use disorder Pancreatitis   PLAN: Safety and Monitoring:             -- Involuntary admission to inpatient psychiatric unit for safety, stabilization and treatment             -- Daily contact with patient to assess and evaluate symptoms and progress in treatment             -- Patient's case to be discussed in multi-disciplinary team meeting             -- Observation Level : q15 minute checks             -- Vital signs:  q12 hours             -- Precautions: suicide, elopement, and assault   2. Psychiatric Diagnoses and Treatment:              Alcohol  Induced Mood Disorder              -- Continue Seroquel  50 mg po Q hs.              -- Continue Risperdal  M-tab 2 mg po bid for psychosis              -- Continue BH agitation protocol -- Continue Hydroxyzine  25 mg every 6 hours PRN, anxiety.  -- Continue Gabapentin  3200 mg po tid for alcohol  withdrawal syndrome.  --  The risks/benefits/side-effects/alternatives to this medication were discussed in detail with the patient and time was given for questions. The patient consents to medication trial.  -- FDA             -- Metabolic profile and EKG monitoring obtained while on an atypical antipsychotic (BMI: Lipid Panel: HbgA1c: QTc:)              -- Encouraged patient to participate in unit milieu and in scheduled group therapies              -               3. Medical Issues Being Addressed:                Alcohol  withdrawal              -- CIWA,              --Completed the Ativan  taper, Thiamine , MVI with minerals   Cellulitis of right hand              -- Continue Doxycycline  100 mg Q12 x 18 doses    GERD  -- Protonix  40 mg daily    HIV  -- Resumed BIKTARVY  daily    Pancreatis  -- Resumed CREON  three times daily before meals  Tobacco Use Disorder             --  Nicotine  patch 14 mg/24 hours ordered             -- Smoking cessation encouraged   Labs, EKG reviewed     4. Discharge Planning:              -- Social work and case management to assist with discharge planning and identification of hospital follow-up needs prior to discharge             -- Estimated LOS: 5-7 days             -- Discharge Concerns: Need to establish a safety plan; Medication compliance and effectiveness             -- Discharge Goals: Return home with outpatient referrals for mental health follow-up including medication management/psychotherapy   Asuncion Layer, NP, pmhnp, fnp-bc. 04/23/2024, 5:03 PM Patient ID: Wilhelm Hansen, male   DOB: [birth year], 38 y.o.   MRN: [id number] Patient ID: Wilhelm Hansen, male   DOB: 09/09/1986, 38 y.o.   MRN: [id number] Patient ID: Wilhelm Hansen, male   DOB: Feb 14, 1986, 39 y.o.   MRN: [id number] Patient ID: Wilhelm Hansen, male   DOB: Sep 03, 1986, 38 y.o.   MRN: [id number]

## 2024-04-23 NOTE — Plan of Care (Signed)
 Patient is pleasant compliant with treatment plan mood has improved. Denies SI/HI/A/VH and verbally contracts for safety. Interacting well with Games developer. Q 15 minutes safety checks ongoing.   Problem: Education: Goal: Knowledge of [facility] Education information/materials will improve Outcome: Progressing Goal: Emotional status will improve Outcome: Progressing Goal: Mental status will improve Outcome: Progressing

## 2024-04-23 NOTE — Group Note (Signed)
 Date:  04/23/2024 Time:  8:52 PM  Group Topic/Focus:  Wrap-Up Group:   The focus of this group is to help patients review their daily goal of treatment and discuss progress on daily workbooks.    Participation Level:  Active  Participation Quality:  Appropriate  Affect:  Appropriate  Cognitive:  Appropriate  Insight: Appropriate  Engagement in Group:  Engaged  Modes of Intervention:  Discussion  Additional Comments:   Pt states he's had a good day, was able to attend goals group, speak with his doctor, and call his brother. Pt states he is excited and feels prepared to leave soon. Pt denies eveeything  Nyisha Clippard A Grey Rakestraw 04/23/2024, 8:52 PM

## 2024-04-23 NOTE — Plan of Care (Signed)
   Problem: Education: Goal: Emotional status will improve Outcome: Progressing Goal: Mental status will improve Outcome: Progressing Goal: Verbalization of understanding the information provided will improve Outcome: Progressing

## 2024-04-23 NOTE — Group Note (Signed)
 LCSW Group Therapy Note  Group Date: 04/23/2024 Start Time: 1100 End Time: 1200   Type of Therapy and Topic:  Group Therapy - Healthy vs Unhealthy Coping Skills  Participation Level:  Did Not Attend   Description of Group The focus of this group was to determine what unhealthy coping techniques typically are used by group members and what healthy coping techniques would be helpful in coping with various problems. Patients were guided in becoming aware of the differences between healthy and unhealthy coping techniques. Patients were asked to identify 2-3 healthy coping skills they would like to learn to use more effectively.  Therapeutic Goals Patients learned that coping is what human beings do all day long to deal with various situations in their lives Patients defined and discussed healthy vs unhealthy coping techniques Patients identified their preferred coping techniques and identified whether these were healthy or unhealthy Patients determined 2-3 healthy coping skills they would like to become more familiar with and use more often. Patients provided support and ideas to each other   Summary of Patient Progress:  Patient did not attend.    Therapeutic Modalities Cognitive Behavioral Therapy Motivational Interviewing  Xan Ingraham M Belem Hintze, LCSWA 04/23/2024  11:49 AM

## 2024-04-23 NOTE — Progress Notes (Signed)
 D: Patient is alert, oriented, pleasant, and cooperative. Denies SI, HI, AVH, and verbally contracts for safety. Patient reports he slept good last night without sleeping medication. Patient reports his appetite as good, energy level as normal, and concentration as good. Patient rates his depression 0/10, hopelessness 0/10, and anxiety 0/10. Patient denies physical symptoms/pain.    A: Scheduled medications administered per MD order. PRN nicotine  gum administered. Support provided. Patient educated on safety on the unit and medications. Routine safety checks every 15 minutes. Patient stated understanding to tell nurse about any new physical symptoms. Patient understands to tell staff of any needs.     R: No adverse drug reactions noted. Patient remains safe at this time and will continue to monitor.    04/23/24 1000  Psych Admission Type (Psych Patients Only)  Admission Status Involuntary  Psychosocial Assessment  Patient Complaints Anxiety  Eye Contact Fair  Facial Expression Anxious;Flat  Affect Anxious;Depressed  Speech Logical/coherent  Interaction Assertive  Motor Activity Slow  Appearance/Hygiene Disheveled  Behavior Characteristics Cooperative;Calm  Mood Anxious;Pleasant  Thought Process  Coherency Circumstantial  Content Blaming others  Delusions None reported or observed  Perception WDL  Hallucination None reported or observed  Judgment Limited  Confusion Mild  Danger to Self  Current suicidal ideation? Denies  Agreement Not to Harm Self Yes  Description of Agreement verbal  Danger to Others  Danger to Others None reported or observed

## 2024-04-23 NOTE — [facility] Group Notes (Signed)
 Adult Psychoeducational Group Note  Date:  04/23/2024 Time:  10:17 AM  Group Topic/Focus:  Goals Group:   The focus of this group is to help patients establish daily goals to achieve during treatment and discuss how the patient can incorporate goal setting into their daily lives to aide in recovery.  Participation Level:  Did Not Attend  Participation Quality:  na  Affect:  na  Cognitive:  na  Insight: na  Engagement in Group:  na  Modes of Intervention:  na  Additional Comments:  na  Gervis Gaba Lee 04/23/2024, 10:17 AM

## 2024-04-23 NOTE — Progress Notes (Signed)
   04/22/24 2255  Psych Admission Type (Psych Patients Only)  Admission Status Involuntary  Psychosocial Assessment  Patient Complaints Anxiety  Eye Contact Fair  Facial Expression Flat  Affect Anxious  Speech Logical/coherent  Interaction Needy  Motor Activity Slow  Appearance/Hygiene Disheveled  Behavior Characteristics Cooperative;Calm  Mood Anxious  Thought Process  Coherency Circumstantial  Content Blaming others  Delusions None reported or observed  Perception WDL  Hallucination None reported or observed  Judgment Limited  Confusion Mild  Danger to Self  Current suicidal ideation? Denies  Agreement Not to Harm Self Yes  Description of Agreement Verbal  Danger to Others  Danger to Others None reported or observed

## 2024-04-23 NOTE — Plan of Care (Signed)

## 2024-04-24 ENCOUNTER — Encounter (HOSPITAL_COMMUNITY): Payer: Self-pay

## 2024-04-24 ENCOUNTER — Telehealth: Payer: Self-pay

## 2024-04-24 DIAGNOSIS — F1020 Alcohol dependence, uncomplicated: Secondary | ICD-10-CM | POA: Diagnosis not present

## 2024-04-24 NOTE — Progress Notes (Signed)
   04/24/24 2200  Psych Admission Type (Psych Patients Only)  Admission Status Involuntary  Psychosocial Assessment  Patient Complaints None  Eye Contact Fair  Facial Expression Anxious;Animated  Affect Anxious  Speech Logical/coherent  Interaction Assertive  Motor Activity Slow  Appearance/Hygiene Unremarkable  Behavior Characteristics Cooperative  Mood Anxious;Pleasant  Aggressive Behavior  Effect No apparent injury  Thought Process  Coherency Circumstantial  Content WDL  Delusions WDL  Perception WDL  Hallucination None reported or observed  Judgment WDL  Confusion WDL  Danger to Self  Current suicidal ideation? Denies  Danger to Others  Danger to Others None reported or observed

## 2024-04-24 NOTE — [facility] Group Notes (Signed)
 Spirituality Group   Group Goal: Support / Education around grief and loss    Group Description: Following introductions and group rules, group members engaged in facilitated group dialog and support around topic of loss, with particular support around experiences of loss in their lives. Group members identified types of loss (relationships / self / things) as well as patterns, circumstances, and changes that precipitate loss. Reflection invited on thoughts / feelings around loss, normalized grief responses, and recognized variety in grief experience. Group noted Worden's four tasks of grief in discussion. Group drew on Adlerian / Rogerian, narrative, MI, with Yalom's group therapy as a primary framework.   Observations: Gary [name] was quiet during group but still engaged passively with engaged in the group discussion.  Gary [name] [name], M.Div [phone]

## 2024-04-24 NOTE — Progress Notes (Signed)
 D: Patient is alert, oriented, pleasant, and cooperative. Denies SI, HI, AVH, and verbally contracts for safety. Patient reports he slept good last night with sleeping medication. Patient reports his appetite as good, energy level as normal, and concentration as good. Patient rates his depression 0/10, hopelessness 0/10, and anxiety 0/10. Patient denies physical symptoms/pain.   Patient moved to 404-1 this shift.    A: Scheduled medications administered per MD order. PRN nicotine  gum administered. Support provided. Patient educated on safety on the unit and medications. Routine safety checks every 15 minutes. Patient stated understanding to tell nurse about any new physical symptoms. Patient understands to tell staff of any needs.     R: No adverse drug reactions noted. Patient remains safe at this time and will continue to monitor.    04/24/24 0900  Psych Admission Type (Psych Patients Only)  Admission Status Involuntary  Psychosocial Assessment  Patient Complaints None  Eye Contact Fair  Facial Expression Animated  Affect Anxious  Speech Logical/coherent  Interaction Assertive  Motor Activity Slow  Appearance/Hygiene Unremarkable  Behavior Characteristics Cooperative;Calm  Mood Anxious;Pleasant  Thought Process  Coherency Circumstantial  Content WDL  Delusions None reported or observed  Perception WDL  Hallucination None reported or observed  Judgment Limited  Confusion None  Danger to Self  Current suicidal ideation? Denies  Agreement Not to Harm Self Yes  Description of Agreement verbal  Danger to Others  Danger to Others None reported or observed

## 2024-04-24 NOTE — BH IP Treatment Plan (Signed)
 Interdisciplinary Treatment and Diagnostic Plan Update  04/24/2024 Time of Session: UPDATE  Gary [name] MRN: [id number]  Principal Diagnosis: Brief psychotic disorder [facility])  Secondary Diagnoses: Principal Problem:   Brief psychotic disorder (HCC) Active Problems:   HIV disease (HCC)   Alcohol  use disorder, severe, dependence (HCC)   Alcohol -induced mood disorder (HCC)   Tobacco use disorder   Pancreatitis   Current Medications:  Current Facility-Administered Medications  Medication Dose Route Frequency Provider Last Rate Last Admin   acetaminophen  (TYLENOL ) tablet 650 mg  650 mg Oral Q6H PRN Onuoha, Josephine C, NP   650 mg at 04/21/24 1119   alum & mag hydroxide-simeth (MAALOX/MYLANTA) 200-200-20 MG/5ML suspension 30 mL  30 mL Oral Q4H PRN Onuoha, Josephine C, NP       bictegravir-emtricitabine -tenofovir  AF (BIKTARVY ) 50-200-25 MG per tablet 1 tablet  1 tablet Oral Daily Onuoha, Josephine C, NP   1 tablet at 04/24/24 3244   haloperidol  (HALDOL ) tablet 5 mg  5 mg Oral TID PRN Onuoha, Josephine C, NP   5 mg at 04/18/24 2332   And   diphenhydrAMINE  (BENADRYL ) capsule 50 mg  50 mg Oral TID PRN Onuoha, Josephine C, NP   50 mg at 04/18/24 2332   haloperidol  lactate (HALDOL ) injection 5 mg  5 mg Intramuscular TID PRN Onuoha, Josephine C, NP       And   diphenhydrAMINE  (BENADRYL ) injection 50 mg  50 mg Intramuscular TID PRN Onuoha, Josephine C, NP       And   LORazepam  (ATIVAN ) injection 2 mg  2 mg Intramuscular TID PRN Onuoha, Josephine C, NP       haloperidol  lactate (HALDOL ) injection 10 mg  10 mg Intramuscular TID PRN Onuoha, Josephine C, NP       And   diphenhydrAMINE  (BENADRYL ) injection 50 mg  50 mg Intramuscular TID PRN Onuoha, Josephine C, NP       And   LORazepam  (ATIVAN ) injection 2 mg  2 mg Intramuscular TID PRN Onuoha, Josephine C, NP       doxycycline  (VIBRA -TABS) tablet 100 mg  100 mg Oral Q12H Onuoha, Josephine C, NP   100 mg at 04/24/24 0819   feeding supplement  (ENSURE PLUS HIGH PROTEIN) liquid 237 mL  237 mL Oral BID BM Parker, Alvin S, MD   237 mL at 04/24/24 1505   gabapentin  (NEURONTIN ) capsule 200 mg  200 mg Oral TID Asuncion Layer I, NP   200 mg at 04/24/24 1151   lipase/protease/amylase (CREON ) capsule 24,000 Units  24,000 Units Oral TID AC Bennett, Christal H, NP   24,000 Units at 04/24/24 1151   magnesium  hydroxide (MILK OF MAGNESIA) suspension 30 mL  30 mL Oral Daily PRN Onuoha, Josephine C, NP       multivitamin with minerals tablet 1 tablet  1 tablet Oral Daily Dorthea Gauze, NP   1 tablet at 04/24/24 0819   nicotine  (NICODERM CQ  - dosed in mg/24 hours) patch 14 mg  14 mg Transdermal Daily Bennett, Christal H, NP   14 mg at 04/22/24 0741   nicotine  polacrilex (NICORETTE) gum 2 mg  2 mg Oral PRN Nwoko, Agnes I, NP   2 mg at 04/24/24 0102   pantoprazole  (PROTONIX ) EC tablet 40 mg  40 mg Oral BID Onuoha, Josephine C, NP   40 mg at 04/24/24 0819   QUEtiapine  (SEROQUEL ) tablet 50 mg  50 mg Oral QHS Parker, Alvin S, MD   50 mg at 04/23/24 2029   risperiDONE  (  RISPERDAL  M-TABS) disintegrating tablet 2 mg  2 mg Oral BID Parker, Alvin S, MD   2 mg at 04/24/24 1610   thiamine  (Vitamin B-1) tablet 100 mg  100 mg Oral Daily Dorthea Gauze, NP   100 mg at 04/24/24 9604   traZODone  (DESYREL ) tablet 50 mg  50 mg Oral QHS PRN Jhonny Moss, MD   50 mg at 04/23/24 2030   PTA Medications: Medications Prior to Admission  Medication Sig Dispense Refill Last Dose/Taking   bictegravir-emtricitabine -tenofovir  AF (BIKTARVY ) 50-200-25 MG TABS tablet Take 1 tablet by mouth daily. 30 tablet 5    chlordiazePOXIDE  (LIBRIUM ) 25 MG capsule Take 2 tabs by mouth every 8 hours the first day, every 12 hours the second day, and once on the third day (Patient not taking: Reported on 04/17/2024) 12 capsule 0    folic acid  (FOLVITE ) 1 MG tablet Take 1 tablet (1 mg total) by mouth daily. 30 tablet 2    gabapentin  (NEURONTIN ) 300 MG capsule Take 2 capsules (600 mg total) by mouth 3  (three) times daily for 14 days. (Patient not taking: Reported on 04/17/2024) 84 capsule 0    lipase/protease/amylase 24000-76000 units CPEP Take 1 capsule (24,000 Units total) by mouth 3 (three) times daily before meals. 270 capsule 0    midodrine  (PROAMATINE ) 10 MG tablet Take 1 tablet (10 mg total) by mouth 3 (three) times daily. (Patient not taking: Reported on 03/31/2024) 90 tablet 0    pantoprazole  (PROTONIX ) 40 MG tablet Take 1 tablet (40 mg total) by mouth 2 (two) times daily. (Patient taking differently: Take 40 mg by mouth 2 (two) times daily as needed (Acid reflux).) 60 tablet 2    QUEtiapine  (SEROQUEL ) 50 MG tablet Take 1 tablet (50 mg total) by mouth 2 (two) times daily. (Patient not taking: Reported on 04/17/2024) 60 tablet 3    thiamine  (VITAMIN B-1) 100 MG tablet Take 1 tablet (100 mg total) by mouth daily. 30 tablet 2     Patient Stressors: Financial difficulties   Health problems   Occupational concerns   Substance abuse    Patient Strengths: Communication skills   Treatment Modalities: Medication Management, Group therapy, Case management,  1 to 1 session with clinician, Psychoeducation, Recreational therapy.   Physician Treatment Plan for Primary Diagnosis: Brief psychotic disorder (HCC) Long Term Goal(s): Improvement in symptoms so as ready for discharge   Short Term Goals: Ability to identify changes in lifestyle to reduce recurrence of condition will improve Ability to verbalize feelings will improve Ability to disclose and discuss suicidal ideas Ability to demonstrate self-control will improve Ability to identify and develop effective coping behaviors will improve Ability to maintain clinical measurements within normal limits will improve Compliance with prescribed medications will improve Ability to identify triggers associated with substance abuse/mental health issues will improve  Medication Management: Evaluate patient's response, side effects, and tolerance of  medication regimen.  Therapeutic Interventions: 1 to 1 sessions, Unit Group sessions and Medication administration.  Evaluation of Outcomes: Progressing  Physician Treatment Plan for Secondary Diagnosis: Principal Problem:   Brief psychotic disorder (HCC) Active Problems:   HIV disease (HCC)   Alcohol  use disorder, severe, dependence (HCC)   Alcohol -induced mood disorder (HCC)   Tobacco use disorder   Pancreatitis  Long Term Goal(s): Improvement in symptoms so as ready for discharge   Short Term Goals: Ability to identify changes in lifestyle to reduce recurrence of condition will improve Ability to verbalize feelings will improve Ability to disclose and discuss  suicidal ideas Ability to demonstrate self-control will improve Ability to identify and develop effective coping behaviors will improve Ability to maintain clinical measurements within normal limits will improve Compliance with prescribed medications will improve Ability to identify triggers associated with substance abuse/mental health issues will improve     Medication Management: Evaluate patient's response, side effects, and tolerance of medication regimen.  Therapeutic Interventions: 1 to 1 sessions, Unit Group sessions and Medication administration.  Evaluation of Outcomes: Progressing   RN Treatment Plan for Primary Diagnosis: Brief psychotic disorder (HCC) Long Term Goal(s): Knowledge of disease and therapeutic regimen to maintain health will improve  Short Term Goals: Ability to remain free from injury will improve, Ability to verbalize feelings will improve, Ability to disclose and discuss suicidal ideas, and Ability to identify and develop effective coping behaviors will improve  Medication Management: RN will administer medications as ordered by provider, will assess and evaluate patient's response and provide education to patient for prescribed medication. RN will report any adverse and/or side effects to  prescribing provider.  Therapeutic Interventions: 1 on 1 counseling sessions, Psychoeducation, Medication administration, Evaluate responses to treatment, Monitor vital signs and CBGs as ordered, Perform/monitor CIWA, COWS, AIMS and Fall Risk screenings as ordered, Perform wound care treatments as ordered.  Evaluation of Outcomes: Progressing   LCSW Treatment Plan for Primary Diagnosis: Brief psychotic disorder [facility]) Long Term Goal(s): Safe transition to appropriate next level of care at discharge, Engage patient in therapeutic group addressing interpersonal concerns.  Short Term Goals: Engage patient in aftercare planning with referrals and resources, Increase social support, Increase ability to appropriately verbalize feelings, Increase emotional regulation, and Increase skills for wellness and recovery  Therapeutic Interventions: Assess for all discharge needs, 1 to 1 time with Social worker, Explore available resources and support systems, Assess for adequacy in community support network, Educate family and significant other(s) on suicide prevention, Complete Psychosocial Assessment, Interpersonal group therapy.  Evaluation of Outcomes: Progressing   Progress in Treatment: Attending groups: Yes. Participating in groups: Yes. Taking medication as prescribed: Yes. Toleration medication: Yes. Family/Significant other contact made: patient declined consents Patient understands diagnosis: No. Discussing patient identified problems/goals with staff: No. Medical problems stabilized or resolved: Yes. Denies suicidal/homicidal ideation: Yes. Issues/concerns per patient self-inventory: No.   New problem(s) identified:  No   New Short Term/Long Term Goal(s):     medication stabilization, elimination of SI thoughts, development of comprehensive mental wellness plan.      Patient Goals:  Keep things moving forward.  I want to be released from the program.   Discharge Plan or Barriers:   Patient recently admitted. CSW will continue to follow and assess for appropriate referrals and possible discharge planning.      Reason for Continuation of Hospitalization: Anxiety Depression Homicidal ideation Medication stabilization Suicidal ideation   Estimated Length of Stay:  5 - 7 days  Last 3 [place] Suicide Severity Risk Score: Flowsheet Row [facility] (Current[facility] from [facility]16/2024 in [facility] ED to Hosp-Admission (Discharged) from 03/31/2024 in [facility] ([facility])  C-SSRS RISK CATEGORY No Risk High Risk No Risk    Last PHQ 2/9 Scores:    01/28/2024   10:37 AM 02/12/2021    5:32 AM  Depression screen PHQ 2/9  Decreased Interest 0 3  Down, Depressed, Hopeless 0 3  PHQ - 2 Score 0 6  Altered sleeping 3 3  Tired, decreased  energy 3 3  Change in appetite 0 2  Feeling bad or failure about yourself  0 3  Trouble concentrating 0 2  Moving slowly or fidgety/restless 0 2  Suicidal thoughts 0 2  PHQ-9 Score 6 23  Difficult doing work/chores Not difficult at all Very difficult    Scribe for Treatment Team: Airiana Elman N Nakota Ackert, LCSW 04/24/2024 4:46 PM

## 2024-04-24 NOTE — Telephone Encounter (Signed)
 [facility] Patient Advocate Encounter  Patient's medications Biktarvy  & Seroquel  have been couriered to [facility] from [facility] and will be picked up on patients appointment on 04/25/24.  Roylene Corn, CPhT [facility] Phone: [phone] Fax:  [phone]

## 2024-04-24 NOTE — Plan of Care (Signed)
  Problem: Education: Goal: Emotional status will improve Outcome: Progressing   Problem: Activity: Goal: Interest or engagement in activities will improve Outcome: Progressing Goal: Sleeping patterns will improve Outcome: Progressing

## 2024-04-24 NOTE — [facility] Suicide Risk Assessment (Signed)
 Collateral contact - [name] (brother) [phone]   Brother confirmed that he will pick up patient tomorrow, Tuesday, 6/16, after work.  He gets off work at 2 PM, has a 30-minute drive, and should arrive at the hospital around 2:30 or 3 PM.   Elberta Lachapelle, LCSWA 04/24/2024

## 2024-04-24 NOTE — Group Note (Signed)
 Occupational Therapy Group Note  Group Topic: Sleep Hygiene  Group Date: 04/24/2024 Start Time: 1430 End Time: 1500 Facilitators: Lynnda Sas, OT   Group Description: Group encouraged increased participation and engagement through topic focused on sleep hygiene. Patients reflected on the quality of sleep they typically receive and identified areas that need improvement. Group was given background information on sleep and sleep hygiene, including common sleep disorders. Group members also received information on how to improve one's sleep and introduced a sleep diary as a tool that can be utilized to track sleep quality over a length of time. Group session ended with patients identifying one or more strategies they could utilize or implement into their sleep routine in order to improve overall sleep quality.        Therapeutic Goal(s):  Identify one or more strategies to improve overall sleep hygiene  Identify one or more areas of sleep that are negatively impacted (sleep too much, too little, etc)     Participation Level: Engaged   Participation Quality: Independent   Behavior: Appropriate   Speech/Thought Process: Relevant   Affect/Mood: Appropriate   Insight: Fair   Judgement: Fair      Modes of Intervention: Education  Patient Response to Interventions:  Attentive   Plan: Continue to engage patient in OT groups 2 - 3x/week.  04/24/2024  Lynnda Sas, OT   Mykel Sponaugle, OT

## 2024-04-24 NOTE — Group Note (Signed)
 Recreation Therapy Group Note   Group Topic:Healthy Decision Making  Group Date: 04/24/2024 Start Time: 1021 End Time: 1048 Facilitators: Gladstone Rosas-McCall, LRT,CTRS Location: [address] Dayroom   Group Topic: Decision Making, Problem Solving, Communication  Goal Area(s) Addresses:  Patient will effectively work with peer towards shared goal.  Patient will identify factors that guided their decision making.  Patient will pro-socially communicate ideas during group session.   Behavioral Response: Engaged  Intervention: Survival Scenario - pencil, paper  Activity: Patients were given a scenario that they were going to be stranded on a deserted [place] for several months before being rescued. Writer tasked them with making a list of 15 things they would choose to bring with them for survival. The list of items was prioritized most important to least. Each patient would come up with their own list, then work together to create a new list of 15 items while in a group of 3-5 peers. LRT discussed each person's list and how it differed from others. The debrief included discussion of priorities, good decisions versus bad decisions, and how it is important to think before acting so we can make the best decision possible. LRT tied the concept of effective communication among group members to patient's support systems outside of the hospital and its benefit post discharge.  Education: Pharmacist, community, Priorities, Support System, Discharge Planning    Education Outcome: Acknowledges education/In group clarification/Needs additional education   Affect/Mood: Appropriate   Participation Level: Engaged   Participation Quality: Independent   Behavior: Appropriate   Speech/Thought Process: Focused   Insight: Good   Judgement: Good   Modes of Intervention: Decision Making   Patient Response to Interventions:  Engaged   Education Outcome:  Acknowledges education   Clinical  Observations/Individualized Feedback: Pt came in late to group session. Pt was able to join in with the rest of the group creating the master list. Pt was bright and engaged. Pt expressed if he could change anything, he would use common sense more often throughout his life.    Plan: Continue to engage patient in RT group sessions 2-3x/week.   Zavion Sleight-McCall, LRT,CTRS  04/24/2024 11:11 AM

## 2024-04-24 NOTE — Progress Notes (Incomplete)
 [facility] MD Progress Note  04/24/2024 3:18 PM Gary [name]  MRN:  [id number]  Reason for admission: 38 year old male who presented to [facility] on April 16, 2024 intoxicated, experiencing alcohol  withdrawal and expressing suicidal ideation.  He has a documented history of multiple alcohol -related ED visits.  He was admitted to the [facility] on April 18, 2024 for treatment and stabilization. He has a past medical history significant of alcohol  induced pancreatitis, pancytopenia, HIV.  Chart review revealed 2 prior psychiatric admission in June 2020 and September 2022.  Psychiatric history includes major depressive disorder, severe alcohol  abuse.   Daily notes:       Continue current plan of care as already in progress.  Principal Problem: Brief psychotic disorder (HCC)  Diagnosis: Principal Problem:   Brief psychotic disorder (HCC) Active Problems:   HIV disease (HCC)   Alcohol  use disorder, severe, dependence (HCC)   Alcohol -induced mood disorder (HCC)   Tobacco use disorder   Pancreatitis  Total Time spent with patient: 35 minutes  Past Psychiatric History: See H&P.  Past Medical History:  Past Medical History:  Diagnosis Date   Alcohol  abuse    Alcohol -induced pancreatitis 04/16/2022   Anxiety    Bipolar 2 disorder (HCC)    HIV (human immunodeficiency virus infection) (HCC)    Pancreatitis    PTSD (post-traumatic stress disorder)    Schizophrenia (HCC)    Seizures (HCC)    Subdural hematoma (HCC)     Past Surgical History:  Procedure Laterality Date   BIOPSY  04/19/2022   Procedure: BIOPSY;  Surgeon: Normie Becton., MD;  Location: [facility] ENDOSCOPY;  Service: Gastroenterology;;   ENTEROSCOPY N/A 04/19/2022   Procedure: ENTEROSCOPY;  Surgeon: Normie Becton., MD;  Location: [facility] ENDOSCOPY;  Service: Gastroenterology;  Laterality: N/A;   INCISION AND DRAINAGE PERIRECTAL ABSCESS N/A 09/24/2016   Procedure: IRRIGATION AND DEBRIDEMENT PERIRECTAL  ABSCESS;  Surgeon: Gwyndolyn Lerner, MD;  Location: [facility] ORS;  Service: General;  Laterality: N/A;   none     Family History:  Family History  Problem Relation Age of Onset   Hypertension Mother    Alcohol  abuse Mother    Alcoholism Mother    Alcohol  abuse Father    Colon cancer Other    Other Other    Cancer Other    Family Psychiatric  History: See H&P.  Social History:  Social History   Substance and Sexual Activity  Alcohol  Use Yes   Alcohol /week: 105.0 standard drinks of alcohol    Types: 105 Cans of beer per week   Comment: drinks every day, all day, whatever I can get my hands on. Half a gallon of vodka 12/16     Social History   Substance and Sexual Activity  Drug Use Not Currently   Types: Methamphetamines, Cocaine   Comment: last used 10/22/2023    Social History   Socioeconomic History   Marital status: Single    Spouse name: Not on file   Number of children: Not on file   Years of education: Not on file   Highest education level: Not on file  Occupational History   Occupation: unemployed  Tobacco Use   Smoking status: Every Day    Current packs/day: 0.50    Average packs/day: 0.5 packs/day for 21.5 years (10.7 ttl pk-yrs)    Types: Cigarettes    Start date: 2004   Smokeless tobacco: Never   Tobacco comments:    unable to smoke while incarcerated 6+ months 02/13/20  Vaping Use   Vaping status: Never Used  Substance and Sexual Activity   Alcohol  use: Yes    Alcohol /week: 105.0 standard drinks of alcohol     Types: 105 Cans of beer per week    Comment: drinks every day, all day, whatever I can get my hands on. Half a gallon of vodka 12/16   Drug use: Not Currently    Types: Methamphetamines, Cocaine    Comment: last used 10/22/2023   Sexual activity: Yes    Partners: Male, Male    Comment: declined condoms 01/2024  Other Topics Concern   Not on file  Social History Narrative   Currently living on the streets   Independent at baseline    Occasionally goes to the [facility]   Social Drivers of Health   Financial Resource Strain: Patient Declined (10/11/2023)   Overall Financial Resource Strain (CARDIA)    Difficulty of Paying Living Expenses: Patient declined  Food Insecurity: Patient Declined (04/18/2024)   Hunger Vital Sign    Worried About Running Out of Food in the Last Year: Patient declined    Ran Out of Food in the Last Year: Patient declined  Recent Concern: Food Insecurity - Food Insecurity Present (02/19/2024)   Hunger Vital Sign    Worried About Running Out of Food in the Last Year: Often true    Ran Out of Food in the Last Year: Often true  Transportation Needs: Patient Declined (04/18/2024)   PRAPARE - Administrator, Civil Service (Medical): Patient declined    Lack of Transportation (Non-Medical): Patient declined  Recent Concern: Transportation Needs - Unmet Transportation Needs (02/19/2024)   PRAPARE - Transportation    Lack of Transportation (Medical): Yes    Lack of Transportation (Non-Medical): Yes  Physical Activity: Not on file  Stress: No Stress Concern Present (06/30/2022)   Received from [facility] of Occupational Health - Occupational Stress Questionnaire    Feeling of Stress : Not at all  Recent Concern: Stress - Stress Concern Present (06/25/2022)   Received from [facility] of Occupational Health - Occupational Stress Questionnaire    Feeling of Stress : Rather much  Social Connections: Patient Declined (04/18/2024)   Social Connection and Isolation Panel    Frequency of Communication with Friends and Family: Patient declined    Frequency of Social Gatherings with Friends and Family: Patient declined    Attends Religious Services: Patient declined    Database administrator or Organizations: Patient declined    Attends Banker Meetings: Patient declined    Marital Status: Patient declined  Recent Concern: Social Connections -  Socially Isolated (02/03/2024)   Social Connection and Isolation Panel    Frequency of Communication with Friends and Family: Never    Frequency of Social Gatherings with Friends and Family: Never    Attends Religious Services: Never    Database administrator or Organizations: No    Attends Engineer, structural: Never    Marital Status: Never married   Additional Social History:   Sleep: Fair  Appetite:  Fair  Current Medications: Current Facility-Administered Medications  Medication Dose Route Frequency Provider Last Rate Last Admin   acetaminophen  (TYLENOL ) tablet 650 mg  650 mg Oral Q6H PRN Onuoha, Josephine C, NP   650 mg at 04/21/24 1119   alum & mag hydroxide-simeth (MAALOX/MYLANTA) 200-200-20 MG/5ML suspension 30 mL  30 mL Oral Q4H PRN Onuoha, Josephine C, NP  bictegravir-emtricitabine -tenofovir  AF (BIKTARVY ) 50-200-25 MG per tablet 1 tablet  1 tablet Oral Daily Onuoha, Josephine C, NP   1 tablet at 04/24/24 1610   haloperidol  (HALDOL ) tablet 5 mg  5 mg Oral TID PRN Onuoha, Josephine C, NP   5 mg at 04/18/24 2332   And   diphenhydrAMINE  (BENADRYL ) capsule 50 mg  50 mg Oral TID PRN Onuoha, Josephine C, NP   50 mg at 04/18/24 2332   haloperidol  lactate (HALDOL ) injection 5 mg  5 mg Intramuscular TID PRN Onuoha, Josephine C, NP       And   diphenhydrAMINE  (BENADRYL ) injection 50 mg  50 mg Intramuscular TID PRN Onuoha, Josephine C, NP       And   LORazepam  (ATIVAN ) injection 2 mg  2 mg Intramuscular TID PRN Onuoha, Josephine C, NP       haloperidol  lactate (HALDOL ) injection 10 mg  10 mg Intramuscular TID PRN Onuoha, Josephine C, NP       And   diphenhydrAMINE  (BENADRYL ) injection 50 mg  50 mg Intramuscular TID PRN Onuoha, Josephine C, NP       And   LORazepam  (ATIVAN ) injection 2 mg  2 mg Intramuscular TID PRN Onuoha, Josephine C, NP       doxycycline  (VIBRA -TABS) tablet 100 mg  100 mg Oral Q12H Onuoha, Josephine C, NP   100 mg at 04/24/24 0819   feeding supplement  (ENSURE PLUS HIGH PROTEIN) liquid 237 mL  237 mL Oral BID BM Parker, Alvin S, MD   237 mL at 04/24/24 1505   gabapentin  (NEURONTIN ) capsule 200 mg  200 mg Oral TID Nwoko, Agnes I, NP   200 mg at 04/24/24 1151   lipase/protease/amylase (CREON ) capsule 24,000 Units  24,000 Units Oral TID AC Ronelle Smallman H, NP   24,000 Units at 04/24/24 1151   magnesium  hydroxide (MILK OF MAGNESIA) suspension 30 mL  30 mL Oral Daily PRN Onuoha, Josephine C, NP       multivitamin with minerals tablet 1 tablet  1 tablet Oral Daily Dorthea Gauze, NP   1 tablet at 04/24/24 0819   nicotine  (NICODERM CQ  - dosed in mg/24 hours) patch 14 mg  14 mg Transdermal Daily Jaedin Trumbo H, NP   14 mg at 04/22/24 0741   nicotine  polacrilex (NICORETTE) gum 2 mg  2 mg Oral PRN Nwoko, Agnes I, NP   2 mg at 04/24/24 9604   pantoprazole  (PROTONIX ) EC tablet 40 mg  40 mg Oral BID Onuoha, Josephine C, NP   40 mg at 04/24/24 0819   QUEtiapine  (SEROQUEL ) tablet 50 mg  50 mg Oral QHS Parker, Alvin S, MD   50 mg at 04/23/24 2029   risperiDONE  (RISPERDAL  M-TABS) disintegrating tablet 2 mg  2 mg Oral BID Parker, Alvin S, MD   2 mg at 04/24/24 5409   thiamine  (Vitamin B-1) tablet 100 mg  100 mg Oral Daily Dorthea Gauze, NP   100 mg at 04/24/24 8119   traZODone  (DESYREL ) tablet 50 mg  50 mg Oral QHS PRN Jhonny Moss, MD   50 mg at 04/23/24 2030   Lab Results:  No results found for this or any previous visit (from the past 48 hours).  Blood Alcohol  level:  Lab Results  Component Value Date   ETH 402 (HH) 04/16/2024   ETH 115 (H) 03/31/2024   Metabolic Disorder Labs: Lab Results  Component Value Date   HGBA1C 4.8 04/18/2024   MPG 91 04/18/2024   MPG  96.8 05/25/2023   No results found for: PROLACTIN Lab Results  Component Value Date   CHOL 162 04/18/2024   TRIG 47 04/18/2024   HDL 87 04/18/2024   CHOLHDL 1.9 04/18/2024   VLDL 9 04/18/2024   LDLCALC 66 04/18/2024   LDLCALC 73 12/15/2022   Physical Findings: AIMS:  ,  ,  ,  ,    CIWA:  CIWA-Ar Total: 2 COWS:     Musculoskeletal: Strength & Muscle Tone: within normal limits Gait & Station: normal Patient leans: N/A  Psychiatric Specialty Exam:  Presentation  General Appearance:  Casual; Fairly Groomed  Eye Contact: Good  Speech: Clear and Coherent; Normal Rate  Speech Volume: Normal  Handedness: Right  Mood and Affect  Mood: -- (Some improvement today.)  Affect: Congruent  Thought Process  Thought Processes: Coherent; Linear  Descriptions of Associations:Intact  Orientation:Full (Time, Place and Person)  Thought Content:Logical  History of Schizophrenia/Schizoaffective disorder: NA  Duration of Psychotic Symptoms: NA  Hallucinations:No data recorded  Ideas of Reference:None  Suicidal Thoughts:No data recorded  Homicidal Thoughts:No data recorded  Sensorium  Memory: Immediate Good; Recent Fair; Remote Fair  Judgment: -- (Improving)  Insight: Fair   Art therapist  Concentration: Good  Attention Span: Good  Recall: [employer] of Knowledge: Fair  Language: Fair   Psychomotor Activity  Psychomotor Activity: No data recorded   Assets  Assets: Communication Skills; Desire for Improvement; Housing; Resilience; Social Support   Sleep  Sleep: No data recorded   Physical Exam: Physical Exam Vitals and nursing note reviewed.  HENT:     Head: Normocephalic.     Nose: Nose normal.     Mouth/Throat:     Pharynx: Oropharynx is clear.   Cardiovascular:     Rate and Rhythm: Normal rate.     Pulses: Normal pulses.  Pulmonary:     Effort: Pulmonary effort is normal.  Genitourinary:    Comments: Deferred.  Musculoskeletal:        General: Normal range of motion.     Cervical back: Normal range of motion.   Neurological:     General: No focal deficit present.     Mental Status: He is alert and oriented to person, place, and time.     Comments: Appears disoriented &  disorganized.   Review of Systems  Constitutional:  Negative for chills, diaphoresis and fever.  HENT:  Negative for congestion and sore throat.   Respiratory:  Negative for cough, shortness of breath and wheezing.   Cardiovascular:  Negative for chest pain and palpitations.  Gastrointestinal:  Negative for abdominal pain, blood in stool, constipation, diarrhea, heartburn, melena, nausea and vomiting.  Musculoskeletal:  Negative for joint pain and myalgias.  Neurological:  Positive for weakness (Pt appears sluggish). Negative for dizziness, tingling, tremors, sensory change, speech change, focal weakness, seizures and headaches.  Psychiatric/Behavioral:  Positive for substance abuse. Negative for hallucinations, memory loss and suicidal ideas. The patient is not nervous/anxious and does not have insomnia.    Blood pressure 112/72, pulse 83, temperature 98 F (36.7 C), temperature source Oral, resp. rate 18, height 6' (1.829 m), weight 62.1 kg, SpO2 100%. Body mass index is 18.58 kg/m.  Treatment Plan Summary: Daily contact with patient to assess and evaluate symptoms and progress in treatment and Medication management.   Diagnoses / Active Problems: Principal Problem: Alcohol -induced mood disorder (HCC)  Active Problems: HIV disease (HCC) Alcohol  use disorder, severe, dependence (HCC) Tobacco use disorder Pancreatitis   PLAN: Safety  and Monitoring:             -- Involuntary admission to inpatient psychiatric unit for safety, stabilization and treatment             -- Daily contact with patient to assess and evaluate symptoms and progress in treatment             -- Patient's case to be discussed in multi-disciplinary team meeting             -- Observation Level : q15 minute checks             -- Vital signs:  q12 hours             -- Precautions: suicide, elopement, and assault   2. Psychiatric Diagnoses and Treatment:              Alcohol  Induced Mood Disorder               -- Continue Seroquel  50 mg po Q hs.              -- Continue Risperdal  M-tab 2 mg po bid for psychosis              -- Continue BH agitation protocol -- Continue Hydroxyzine  25 mg every 6 hours PRN, anxiety.  -- Continue Gabapentin  3200 mg po tid for alcohol  withdrawal syndrome.  --  The risks/benefits/side-effects/alternatives to this medication were discussed in detail with the patient and time was given for questions. The patient consents to medication trial.  -- FDA             -- Metabolic profile and EKG monitoring obtained while on an atypical antipsychotic (BMI: Lipid Panel: HbgA1c: QTc:)              -- Encouraged patient to participate in unit milieu and in scheduled group therapies              -               3. Medical Issues Being Addressed:                Alcohol  withdrawal              -- CIWA,              --Completed the Ativan  taper, Thiamine , MVI with minerals   Cellulitis of right hand              -- Continue Doxycycline  100 mg Q12 x 18 doses    GERD  -- Protonix  40 mg daily    HIV  -- Resumed BIKTARVY  daily    Pancreatis  -- Resumed CREON  three times daily before meals                Tobacco Use Disorder             -- Nicotine  patch 14 mg/24 hours ordered             -- Smoking cessation encouraged   Labs, EKG reviewed     4. Discharge Planning:              -- Social work and case management to assist with discharge planning and identification of hospital follow-up needs prior to discharge             -- Estimated LOS: 5-7 days             -- Discharge Concerns: Need  to establish a safety plan; Medication compliance and effectiveness             -- Discharge Goals: Return home with outpatient referrals for mental health follow-up including medication management/psychotherapy   Lennard Quirk, NP, pmhnp, fnp-bc. 04/24/2024, 3:18 PM Patient ID: Wilhelm Hansen, male   DOB: 1986/04/16, 38 y.o.   MRN: [id number] Patient ID: Wilhelm Hansen, male    DOB: Oct 04, 1986, 38 y.o.   MRN: [id number] Patient ID: Wilhelm Hansen, male   DOB: Jun 29, 1986, 38 y.o.   MRN: [id number] Patient ID: Wilhelm Hansen, male   DOB: 11/04/1986, 38 y.o.   MRN: [id number] Patient ID: Wilhelm Hansen, male   DOB: Aug 10, 1986, 38 y.o.   MRN: [id number]

## 2024-04-24 NOTE — Plan of Care (Signed)

## 2024-04-25 ENCOUNTER — Ambulatory Visit: Payer: MEDICAID | Admitting: Internal Medicine

## 2024-04-25 ENCOUNTER — Other Ambulatory Visit: Payer: Self-pay

## 2024-04-25 ENCOUNTER — Other Ambulatory Visit (HOSPITAL_COMMUNITY): Payer: Self-pay

## 2024-04-25 DIAGNOSIS — F1020 Alcohol dependence, uncomplicated: Secondary | ICD-10-CM | POA: Diagnosis not present

## 2024-04-25 MED ORDER — DOXYCYCLINE HYCLATE 100 MG PO TABS
100.0000 mg | ORAL_TABLET | Freq: Two times a day (BID) | ORAL | 0 refills | Status: AC
  Filled 2024-04-25 (×2): qty 4, 2d supply, fill #0

## 2024-04-25 MED ORDER — ADULT MULTIVITAMIN W/MINERALS CH: 1.0000 | ORAL_TABLET | Freq: Every day | ORAL | Status: DC

## 2024-04-25 MED ORDER — PANCRELIPASE (LIP-PROT-AMYL) 24000-76000 UNITS PO CPEP
24000.0000 [IU] | ORAL_CAPSULE | Freq: Three times a day (TID) | ORAL | 0 refills | Status: DC
  Filled 2024-04-25: qty 100, 33d supply, fill #0

## 2024-04-25 MED ORDER — QUETIAPINE FUMARATE 50 MG PO TABS
50.0000 mg | ORAL_TABLET | Freq: Every day | ORAL | 0 refills | Status: DC
  Filled 2024-04-25: qty 30, 30d supply, fill #0

## 2024-04-25 MED ORDER — NICOTINE 14 MG/24HR TD PT24: 14.0000 mg | MEDICATED_PATCH | Freq: Every day | TRANSDERMAL | Status: DC

## 2024-04-25 MED ORDER — RISPERIDONE 2 MG PO TBDP
2.0000 mg | ORAL_TABLET | Freq: Two times a day (BID) | ORAL | 0 refills | Status: DC
Start: 2024-04-25 — End: 2024-05-19
  Filled 2024-04-25: qty 56, 28d supply, fill #0

## 2024-04-25 MED ORDER — ENSURE PLUS HIGH PROTEIN PO LIQD: 237.0000 mL | Freq: Two times a day (BID) | ORAL | Status: DC

## 2024-04-25 MED ORDER — GABAPENTIN 100 MG PO CAPS
200.0000 mg | ORAL_CAPSULE | Freq: Three times a day (TID) | ORAL | 0 refills | Status: DC
  Filled 2024-04-25: qty 180, 30d supply, fill #0

## 2024-04-25 NOTE — Group Note (Signed)
 Recreation Therapy Group Note   Group Topic:Animal Assisted Therapy   Group Date: 04/25/2024 Start Time: 1610 End Time: 1035 Facilitators: Honour Schwieger-McCall, LRT,CTRS Location: [address] Dayroom   Animal-Assisted Activity (AAA) Program Checklist/Progress Notes Patient Eligibility Criteria Checklist & Daily Group note for Rec Tx Intervention  AAA/T Program Assumption of Risk Form signed by Patient/ or Parent Legal Guardian Yes  Patient understands his/her participation is voluntary Yes  Behavioral Response:    Education: Charity fundraiser, Appropriate Animal Interaction   Education Outcome: Acknowledges education.    Affect/Mood: N/A   Participation Level: Did not attend    Clinical Observations/Individualized Feedback:    Plan: Continue to engage patient in RT group sessions 2-3x/week.   Tyshauna Finkbiner-McCall, LRT,CTRS 04/25/2024 1:23 PM

## 2024-04-25 NOTE — [facility] Suicide Risk Assessment (Signed)
 [facility] Discharge Suicide Risk Assessment   Principal Problem: Brief psychotic disorder [facility]) Discharge Diagnoses: Principal Problem:   Brief psychotic disorder (HCC) Active Problems:   HIV disease (HCC)   Alcohol  use disorder, severe, dependence (HCC)   Alcohol -induced mood disorder (HCC)   Tobacco use disorder   Pancreatitis   Total Time spent with patient: 30 minutes  Musculoskeletal: Strength & Muscle Tone: within normal limits Gait & Station: normal Patient leans: N/A  Psychiatric Specialty Exam  Presentation  General Appearance and behavior:  Casually dressed, slim build, not in any distress, appropriate behavior, engaged politely.  No EPS.  Eye Contact: Good.  Speech: Spontaneous.  Normal rate, tone and volume.  Normal prosody of speech.  Mood and Affect  Mood: Euthymic.  Affect: Full range and appropriate.  Thought Process  Thought Processes: Linear and goal directed.  Descriptions of Associations:Intact  Orientation:Full (Time, Place and Person)  Thought Content: Future oriented.  No current suicidal thoughts.  No homicidal thoughts.  No thoughts of violence.  No negative ruminative flooding.  No guilty ruminations.  No delusional theme.  No obsessions.  Hallucinations: No hallucination in any modality.  Sensorium  Memory: Good.  Judgment: Good.  Insight: Good  Executive Functions  Concentration: Good.  Attention Span: Good.  Recall: Good.  Fund of Knowledge: Good.  Language: Good   Psychomotor Activity  Normal psychomotor activity   Physical Exam: Physical Exam ROS Blood pressure 104/75, pulse 84, temperature 98.3 F (36.8 C), temperature source Oral, resp. rate 16, height 6' (1.829 m), weight 62.1 kg, SpO2 99%. Body mass index is 18.58 kg/m.  Mental Status Per Nursing Assessment::   On Admission:  NA  Demographic Factors:  Male, Adolescent or young adult, and Unemployed  Loss Factors: Financial problems/change  in socioeconomic status  Historical Factors: Family history of suicide, Family history of mental illness or substance abuse, and Impulsivity  Risk Reduction Factors:   Positive social support, Positive therapeutic relationship, and Positive coping skills or problem solving skills  Continued Clinical Symptoms:  Patient has completely detoxed from alcohol .  He is not exhibiting any residual mood symptoms.  No evidence of depression.  No overwhelming anxiety.  No psychotic features.  No delusional preoccupation.  Cognitive Features That Contribute To Risk:  None    Suicide Risk:  Minimal: Patient is not currently suicidal.  He is not homicidal.  No violent thoughts.  Modifiable risk factor targeted during this admission as alcohol  intoxication.  He has completely detoxed from alcohol .  He is stable on his current regimen.  No new psychosocial stressors.  No current craving for alcohol  or any psychoactive substance. Patient is stable for care at the lower setting.  Follow-up Information     [facility] Follow up.   Specialty: Professional Counselor Why: Please go to this provider on 04/26/2024 at 9 AM for an assessment, to receive an appointment for therapy services.  Otherwise, you can walk-in Monday through Friday, from 9 am to 1 pm. Contact information: [address]  [address] [place] [address] [phone]         [facility] Follow up.   Specialty: Behavioral Health Why: Please go to this provider on 05/02/2024 for an assessment, to receive an appointment for medication management services.  Otherwise, you can walk-in Monday through Friday, arrive by 7:00 am. Contact information: [address] [place]  [phone]  Plan Of Care/Follow-up recommendations:  See discharge summary.  Amelie Jury, MD 04/25/2024, 9:39 AM

## 2024-04-25 NOTE — Group Note (Signed)
 LCSW Group Therapy Note   Group Date: 04/25/2024 Start Time: 1100 End Time: 1200  Participation:  did not attend  Type of Therapy:  Group Therapy  Topic:  Understanding Your Path to Change  Objective:  The goal is to help individuals understand the stages of change, identify where they currently are in the process, and provide actionable next steps to continue moving forward in their journey of change.  Goals: Learn about the six stages of change:  Precontemplation, Contemplation, Preparation, Action, Maintenance, and Relapse Reflect on Current Change Efforts:  Recognize which stage participants are in regarding a personal change. Plan Next Steps for Moving Forward:  Create an action plan based on their current stage of change.  Class Summary:  In this session, we explored the Stages of Change as a framework to understand the process of change.  We discussed how each stage helps individuals recognize where they are in their personal journey and used the Stages of Change Worksheet for self-reflection. Participants answered questions to better understand their current stage, challenges, and progress. We also emphasized the importance of moving forward, even if setbacks (Relapse) occur, and created actionable steps to help participants continue progressing. By the end of the session, participants gained a clearer understanding of their path to change and left with a clear plan for next steps.  Therapeutic Modalities:  Elements of CBT (cognitive restructuring, problem solving)  Element of DBT (mindfulness, distress tolerance)   Shady Padron O Marriana Hibberd, LCSWA 04/25/2024  12:28 PM

## 2024-04-25 NOTE — Progress Notes (Addendum)
 [facility] Adult Case Management Discharge Plan :  Will you be returning to the same living situation after discharge:  Yes,  patient said he lives with his brother At discharge, do you have transportation home?: Yes,  patient's brother, Kito Cuffe will pick him up between 2:30 PM - 3 PM Do you have the ability to pay for your medications: Yes,  patient has insurance  Release of information consent forms completed and in the chart;  Patient's signature needed at discharge.  Patient to Follow up at:  Follow-up Information     [facility] Follow up.   Specialty: Professional Counselor Why: Please go to this provider on 04/26/2024 at 9 AM for an assessment, to receive an appointment for therapy services.  Otherwise, you can walk-in Monday through Friday, from 9 am to 1 pm. Contact information: [address]  [address] [place] [address] [phone]         [facility] Follow up.   Specialty: Behavioral Health Why: Please go to this provider on 05/02/2024 for an assessment, to receive an appointment for medication management services.  Otherwise, you can walk-in Monday through Friday, arrive by 7:00 am. Contact information: [address] [place]  [address] [phone]                Next level of care provider has access to [facility] Link:no  Safety Planning and Suicide Prevention discussed: Yes,  Elier Zellars (brother) [phone]  Has patient been referred to the Quitline?: Patient refused referral for treatment.  Patient declined a referral through [employer].  I can quit on my own.  Patient has been referred for addiction treatment: Patient refused referral for treatment.  CSW offered a print-out including substance use in-patient treatment facilities but patient declined.  Patient admitted to drinking alcohol , but doesn't want to stop at this time (yesterday he said he would like to stop drinking).  According to the information in the file, patient has a history of using methamphetamines and cocaine.   Saragrace Selke O Falicity Sheets, LCSWA 04/25/2024, 8:32 AM

## 2024-04-25 NOTE — Group Note (Signed)
 Date:  04/25/2024 Time:  10:53 AM  Group Topic/Focus:  Coping With Mental Health Crisis:   The purpose of this group is to help patients identify strategies for coping with mental health crisis.  Group discusses possible causes of crisis and ways to manage them effectively. Goals Group:   The focus of this group is to help patients establish daily goals to achieve during treatment and discuss how the patient can incorporate goal setting into their daily lives to aide in recovery. Orientation:   The focus of this group is to educate the patient on the purpose and policies of crisis stabilization and provide a format to answer questions about their admission.  The group details unit policies and expectations of patients while admitted.    Participation Level:  Did Not Attend   Gary [name] 04/25/2024, 10:53 AM

## 2024-04-25 NOTE — Progress Notes (Signed)
 Collateral contact - Javari Bufkin (brother) [phone]   [name] confirmed that he will pick up patient between 2:30 - 3 PM.   Ainsley Sanguinetti, LCSWA

## 2024-04-25 NOTE — Discharge Summary (Signed)
 Physician Discharge Summary Note  Patient:  Gary [name] is an 38 y.o., male MRN:  [id number] DOB:  15-Dec-1985 Patient phone:  [phone] (home)  Patient address:   [address]  [address] [place] [address],  Total Time spent with patient: 30 minutes  Date of Admission:  04/17/2024 Date of Discharge: 04/25/24   Reason for Admission:  Gary [name] is a 38 year old male who presented to [facility] ED on April 16, 2024 intoxicated, experiencing alcohol  withdrawal and expressing suicidal ideation.  He has a documented history of multiple alcohol -related ED visits.  He was admitted to the [facility] on April 18, 2024 for treatment and stabilization. He has a past medical history significant of alcohol  induced pancreatitis, pancytopenia, HIV.  Chart review revealed 2 prior psychiatric admission in June 2020 and September 2022.  Psychiatric history includes major depressive disorder, severe alcohol  abuse.   Principal Problem: Brief psychotic disorder [facility]) Discharge Diagnoses: Principal Problem:   Brief psychotic disorder (HCC) Active Problems:   HIV disease (HCC)   Alcohol  use disorder, severe, dependence (HCC)   Alcohol -induced mood disorder (HCC)   Tobacco use disorder   Pancreatitis   Past Psychiatric History: See H&P   Past Medical History:  Past Medical History:  Diagnosis Date   Alcohol  abuse    Alcohol -induced pancreatitis 04/16/2022   Anxiety    Bipolar 2 disorder (HCC)    HIV (human immunodeficiency virus infection) (HCC)    Pancreatitis    PTSD (post-traumatic stress disorder)    Schizophrenia (HCC)    Seizures (HCC)    Subdural hematoma (HCC)     Past Surgical History:  Procedure Laterality Date   BIOPSY  04/19/2022   Procedure: BIOPSY;  Surgeon: Normie Becton., MD;  Location: [facility] ENDOSCOPY;  Service: Gastroenterology;;   ENTEROSCOPY N/A 04/19/2022   Procedure: ENTEROSCOPY;  Surgeon: Normie Becton., MD;  Location: [facility]  ENDOSCOPY;  Service: Gastroenterology;  Laterality: N/A;   INCISION AND DRAINAGE PERIRECTAL ABSCESS N/A 09/24/2016   Procedure: IRRIGATION AND DEBRIDEMENT PERIRECTAL ABSCESS;  Surgeon: Gwyndolyn Lerner, MD;  Location: [facility] ORS;  Service: General;  Laterality: N/A;   none     Family History:  Family History  Problem Relation Age of Onset   Hypertension Mother    Alcohol  abuse Mother    Alcoholism Mother    Alcohol  abuse Father    Colon cancer Other    Other Other    Cancer Other    Family Psychiatric  History: See H&P  Social History:  Social History   Substance and Sexual Activity  Alcohol  Use Yes   Alcohol /week: 105.0 standard drinks of alcohol    Types: 105 Cans of beer per week   Comment: drinks every day, all day, whatever I can get my hands on. Half a gallon of vodka 12/16     Social History   Substance and Sexual Activity  Drug Use Not Currently   Types: Methamphetamines, Cocaine   Comment: last used 10/22/2023    Social History   Socioeconomic History   Marital status: Single    Spouse name: Not on file   Number of children: Not on file   Years of education: Not on file   Highest education level: Not on file  Occupational History   Occupation: unemployed  Tobacco Use   Smoking status: Every Day    Current packs/day: 0.50    Average packs/day: 0.5 packs/day for 21.5 years (10.7 ttl pk-yrs)    Types: Cigarettes  Start date: 2004   Smokeless tobacco: Never   Tobacco comments:    unable to smoke while incarcerated 6+ months 02/13/20  Vaping Use   Vaping status: Never Used  Substance and Sexual Activity   Alcohol  use: Yes    Alcohol /week: 105.0 standard drinks of alcohol     Types: 105 Cans of beer per week    Comment: drinks every day, all day, whatever I can get my hands on. Half a gallon of vodka 12/16   Drug use: Not Currently    Types: Methamphetamines, Cocaine    Comment: last used 10/22/2023   Sexual activity: Yes    Partners: Male, Male     Comment: declined condoms 01/2024  Other Topics Concern   Not on file  Social History Narrative   Currently living on the streets   Independent at baseline   Occasionally goes to the [facility]   Social Drivers of Health   Financial Resource Strain: Patient Declined (10/11/2023)   Overall Financial Resource Strain (CARDIA)    Difficulty of Paying Living Expenses: Patient declined  Food Insecurity: Patient Declined (04/18/2024)   Hunger Vital Sign    Worried About Running Out of Food in the Last Year: Patient declined    Ran Out of Food in the Last Year: Patient declined  Recent Concern: Food Insecurity - Food Insecurity Present (02/19/2024)   Hunger Vital Sign    Worried About Running Out of Food in the Last Year: Often true    Ran Out of Food in the Last Year: Often true  Transportation Needs: Patient Declined (04/18/2024)   PRAPARE - Administrator, Civil Service (Medical): Patient declined    Lack of Transportation (Non-Medical): Patient declined  Recent Concern: Transportation Needs - Unmet Transportation Needs (02/19/2024)   PRAPARE - Transportation    Lack of Transportation (Medical): Yes    Lack of Transportation (Non-Medical): Yes  Physical Activity: Not on file  Stress: No Stress Concern Present (06/30/2022)   Received from [facility] of Occupational Health - Occupational Stress Questionnaire    Feeling of Stress : Not at all  Recent Concern: Stress - Stress Concern Present (06/25/2022)   Received from [facility] of Occupational Health - Occupational Stress Questionnaire    Feeling of Stress : Rather much  Social Connections: Patient Declined (04/18/2024)   Social Connection and Isolation Panel    Frequency of Communication with Friends and Family: Patient declined    Frequency of Social Gatherings with Friends and Family: Patient declined    Attends Religious Services: Patient declined    Database administrator or  Organizations: Patient declined    Attends Banker Meetings: Patient declined    Marital Status: Patient declined  Recent Concern: Social Connections - Socially Isolated (02/03/2024)   Social Connection and Isolation Panel    Frequency of Communication with Friends and Family: Never    Frequency of Social Gatherings with Friends and Family: Never    Attends Religious Services: Never    Database administrator or Organizations: No    Attends Banker Meetings: Never    Marital Status: Never married    Hospital Course:  During the patient's hospitalization, patient had extensive initial psychiatric evaluation, and follow-up psychiatric evaluations every day.  Psychiatric diagnoses provided upon initial assessment:  Brief psychotic disorder (HCC)  Patient's psychiatric medications were adjusted on admission: Seroquel  50 mg twice daily was continued.  During the  hospitalization, other adjustments were made to the patient's psychiatric medication regimen:  Gabapentin  200 mg 3 times daily was initiated to target alcohol  withdrawal cravings.  Risperidone  2 mg twice daily was started for management of psychosis.  Seroquel  was adjusted to 50 mg nightly.  Patient's care was discussed during the interdisciplinary team meeting every day during the hospitalization.  The patient denies having side effects to prescribed psychiatric medication.  Gradually, patient started adjusting to milieu. The patient was evaluated each day by a clinical provider to ascertain response to treatment. Improvement was noted by the patient's report of decreasing symptoms, improved sleep and appetite, affect, medication tolerance, behavior, and participation in unit programming.  Patient was asked each day to complete a self inventory noting mood, mental status, pain, new symptoms, anxiety and concerns.    Symptoms were reported as significantly decreased or resolved completely by discharge.   On  day of discharge, the patient reports that their mood is stable. The patient denied having suicidal thoughts for more than 48 hours prior to discharge.  Patient denies having homicidal thoughts.  Patient denies having auditory hallucinations.  Patient denies any visual hallucinations or other symptoms of psychosis. The patient was motivated to continue taking medication with a goal of continued improvement in mental health.   The patient reports their target psychiatric symptoms of anxiety, depression, and insomnia.  Responded well to the psychiatric medications, and the patient reports overall benefit other psychiatric hospitalization. Supportive psychotherapy was provided to the patient. The patient also participated in regular group therapy while hospitalized. Coping skills, problem solving as well as relaxation therapies were also part of the unit programming.  Labs were reviewed with the patient, and abnormal results were discussed with the patient.  The patient is able to verbalize their individual safety plan to this provider.  # It is recommended to the patient to continue psychiatric medications as prescribed, after discharge from the hospital.    # It is recommended to the patient to follow up with your outpatient psychiatric provider and PCP.  # It was discussed with the patient, the impact of alcohol , drugs, tobacco have been there overall psychiatric and medical wellbeing, and total abstinence from substance use was recommended the patient.ed.  # Prescriptions provided or sent directly to preferred pharmacy at discharge. Patient agreeable to plan. Given opportunity to ask questions. Appears to feel comfortable with discharge.    # In the event of worsening symptoms, the patient is instructed to call the crisis hotline, 911 and or go to the nearest ED for appropriate evaluation and treatment of symptoms. To follow-up with primary care provider for other medical issues, concerns and or  health care needs  # Patient was discharged home with a plan to follow up as noted below.   Physical Findings: AIMS:  , , 0 ,  ,  ,  ,   CIWA:  CIWA-Ar Total: 1 COWS:   N/A  Musculoskeletal: Strength & Muscle Tone: within normal limits Gait & Station: normal Patient leans: N/A   Psychiatric Specialty Exam:  Presentation  General Appearance:  Casual; Fairly Groomed  Eye Contact: Good  Speech: Clear and Coherent; Normal Rate  Speech Volume: Normal  Handedness: Right   Mood and Affect  Mood: -- (Some improvement today.)  Affect: Congruent   Thought Process  Thought Processes: Coherent; Linear  Descriptions of Associations:Intact  Orientation:Full (Time, Place and Person)  Thought Content:Logical  History of Schizophrenia/Schizoaffective disorder:No data recorded Duration of Psychotic Symptoms:No data recorded  Hallucinations:No data recorded Ideas of Reference:None  Suicidal Thoughts:No data recorded Homicidal Thoughts:No data recorded  Sensorium  Memory: Immediate Good; Recent Fair; Remote Fair  Judgment: -- (Improving)  Insight: Fair   Art therapist  Concentration: Good  Attention Span: Good  Recall: Fair  Fund of Knowledge: Fair  Language: Fair   Psychomotor Activity  Psychomotor Activity:No data recorded  Assets  Assets: Communication Skills; Desire for Improvement; Housing; Resilience; Social Support   Sleep  Sleep:No data recorded Estimated Sleeping Duration (Last 24 Hours): 5.25-6.50 hours   Physical Exam: Physical Exam Nursing note reviewed.  Constitutional:      General: He is not in acute distress. HENT:     Nose: Nose normal.   Cardiovascular:     Rate and Rhythm: Normal rate.     Pulses: Normal pulses.  Pulmonary:     Effort: No respiratory distress.   Musculoskeletal:        General: Normal range of motion.     Cervical back: Normal range of motion.   Skin:    General: Skin is dry.    Neurological:     General: No focal deficit present.     Mental Status: He is alert and oriented to person, place, and time. Mental status is at baseline.   Psychiatric:        Mood and Affect: Mood normal.        Behavior: Behavior normal.        Judgment: Judgment normal.    ROS Blood pressure 104/75, pulse 84, temperature 98.3 F (36.8 C), temperature source Oral, resp. rate 16, height 6' (1.829 m), weight 62.1 kg, SpO2 99%. Body mass index is 18.58 kg/m.   Social History   Tobacco Use  Smoking Status Every Day   Current packs/day: 0.50   Average packs/day: 0.5 packs/day for 21.5 years (10.7 ttl pk-yrs)   Types: Cigarettes   Start date: 2004  Smokeless Tobacco Never  Tobacco Comments   unable to smoke while incarcerated 6+ months 02/13/20   Tobacco Cessation:  A prescription for an FDA-approved tobacco cessation medication provided at discharge   Blood Alcohol  level:  Lab Results  Component Value Date   ETH 402 (HH) 04/16/2024   ETH 115 (H) 03/31/2024    Metabolic Disorder Labs:  Lab Results  Component Value Date   HGBA1C 4.8 04/18/2024   MPG 91 04/18/2024   MPG 96.8 05/25/2023   No results found for: PROLACTIN Lab Results  Component Value Date   CHOL 162 04/18/2024   TRIG 47 04/18/2024   HDL 87 04/18/2024   CHOLHDL 1.9 04/18/2024   VLDL 9 04/18/2024   LDLCALC 66 04/18/2024   LDLCALC 73 12/15/2022    See Psychiatric Specialty Exam and Suicide Risk Assessment completed by Attending Physician prior to discharge.  Discharge destination:  Home  Is patient on multiple antipsychotic therapies at discharge:  Yes,   Do you recommend tapering to monotherapy for antipsychotics?  No   Has Patient had three or more failed trials of antipsychotic monotherapy by history:  No  Recommended Plan for Multiple Antipsychotic Therapies: NA  Discharge Instructions     Activity as tolerated - No restrictions   Complete by: As directed    Diet - low sodium heart  healthy   Complete by: As directed       Allergies as of 04/25/2024       Reactions   Tegretol  [carbamazepine ] Other (See Comments)   Vertigo  Paralysis   Caffeine  Palpitations        Medication List     STOP taking these medications    chlordiazePOXIDE  25 MG capsule Commonly known as: LIBRIUM    midodrine  10 MG tablet Commonly known as: PROAMATINE        TAKE these medications      Indication  Biktarvy  50-200-25 MG Tabs tablet Generic drug: bictegravir-emtricitabine -tenofovir  AF Take 1 tablet by mouth daily.  Indication: HIV Disease   doxycycline  100 MG tablet Commonly known as: VIBRA -TABS Take 1 tablet (100 mg total) by mouth every 12 (twelve) hours for 2 days.  Indication: Infection Under the Skin, Right Hand   feeding supplement Liqd Take 237 mLs by mouth 2 (two) times daily between meals.  Indication: Nutritional Support   folic acid  1 MG tablet Commonly known as: FOLVITE  Take 1 tablet (1 mg total) by mouth daily.  Indication: Anemia From Inadequate Folic Acid    gabapentin  100 MG capsule Commonly known as: NEURONTIN  Take 2 capsules (200 mg total) by mouth 3 (three) times daily. What changed:  medication strength how much to take  Indication: Alcohol  Withdrawal Syndrome, Alcohol  Cravings   multivitamin with minerals Tabs tablet Take 1 tablet by mouth daily. Start taking on: April 26, 2024  Indication: Nutritional Support   nicotine  14 mg/24hr patch Commonly known as: NICODERM CQ  - dosed in mg/24 hours Place 1 patch (14 mg total) onto the skin daily. Start taking on: April 26, 2024  Indication: Nicotine  Addiction   Pancrelipase  (Lip-Prot-Amyl) 24000-76000 units Cpep Take 1 capsule (24,000 Units total) by mouth 3 (three) times daily before meals.  Indication: Pancreatic Insufficiency   pantoprazole  40 MG tablet Commonly known as: PROTONIX  Take 1 tablet (40 mg total) by mouth 2 (two) times daily. What changed:  when to take this reasons to  take this  Indication: Indigestion   QUEtiapine  50 MG tablet Commonly known as: SEROQUEL  Take 1 tablet (50 mg total) by mouth at bedtime. What changed: when to take this  Indication: Trouble Sleeping   risperiDONE  2 MG disintegrating tablet Commonly known as: RISPERDAL  M-TABS Take 1 tablet (2 mg total) by mouth 2 (two) times daily.  Indication: Mood Control   thiamine  100 MG tablet Commonly known as: VITAMIN B1 Take 1 tablet (100 mg total) by mouth daily.  Indication: Deficiency of Vitamin B1        Follow-up Information     [facility] Follow up.   Specialty: Professional Counselor Why: Please go to this provider on 04/26/2024 at 9 AM for an assessment, to receive an appointment for therapy services.  Otherwise, you can walk-in Monday through Friday, from 9 am to 1 pm. Contact information: [address]  [address] [place] [address] [phone]         [facility] Follow up.   Specialty: Behavioral Health Why: Please go to this provider on 05/02/2024 for an assessment, to receive an appointment for medication management services.  Otherwise, you can walk-in Monday through Friday, arrive by 7:00 am. Contact information: [address] [place]  [address] [phone]                Follow-up recommendations: Activity: as tolerated  Diet: heart healthy  Other: -Follow-up with your outpatient psychiatric provider -instructions on appointment date, time, and address (location) are provided to you in discharge paperwork.  -Take your psychiatric medications as prescribed at discharge - instructions are provided to you in the discharge paperwork  -Follow-up with outpatient primary care  doctor and other specialists -for management of preventative medicine and chronic medical disease: HIV, Pancytopenia   -Testing: Follow-up with outpatient provider for abnormal lab results:   -If you are prescribed  an atypical antipsychotic medication, we recommend that your outpatient psychiatrist follow routine screening for side effects within 3 months of discharge, including monitoring: AIMS scale, height, weight, blood pressure, fasting lipid panel, HbA1c, and fasting blood sugar.   -Recommend total abstinence from alcohol , tobacco, and other illicit drug use at discharge.   -If your psychiatric symptoms recur, worsen, or if you have side effects to your psychiatric medications, call your outpatient psychiatric provider, 911, [phone] or go to the nearest emergency department.  -If suicidal thoughts occur, immediately call your outpatient psychiatric provider, 911, [phone] or go to the nearest emergency department.    Signed: Lennard Quirk, NP 04/25/2024, 11:05 AM

## 2024-04-25 NOTE — Plan of Care (Signed)
   Problem: Education: Goal: Knowledge of [facility] Education information/materials will improve Outcome: Completed/Met Goal: Emotional status will improve Outcome: Completed/Met Goal: Mental status will improve Outcome: Completed/Met Goal: Verbalization of understanding the information provided will improve Outcome: Completed/Met   Problem: Activity: Goal: Interest or engagement in activities will improve Outcome: Completed/Met Goal: Sleeping patterns will improve Outcome: Completed/Met   Problem: Coping: Goal: Ability to verbalize frustrations and anger appropriately will improve Outcome: Completed/Met Goal: Ability to demonstrate self-control will improve Outcome: Completed/Met   Problem: Health Behavior/Discharge Planning: Goal: Identification of resources available to assist in meeting health care needs will improve Outcome: Completed/Met Goal: Compliance with treatment plan for underlying cause of condition will improve Outcome: Completed/Met   Problem: Physical Regulation: Goal: Ability to maintain clinical measurements within normal limits will improve Outcome: Completed/Met   Problem: Safety: Goal: Periods of time without injury will increase Outcome: Completed/Met

## 2024-04-25 NOTE — Progress Notes (Signed)
 Gary [name]  D/C'd Home per MD order.  Discussed with the patient and all questions fully answered.   An After Visit Summary was printed and given to the patient. Patient received prescription.  D/c education completed with patient including follow up instructions, medication list, d/c activities limitations if indicated, with other d/c instructions as indicated by MD - patient able to verbalize understanding, all questions fully answered.   Patient instructed to return to ED, call [phone], or call MD for any changes in condition.   Patient escorted to the main, and D/C home via private auto.  Gary [name] 04/25/2024 4:11 PM

## 2024-04-29 ENCOUNTER — Encounter (HOSPITAL_COMMUNITY): Payer: Self-pay

## 2024-04-29 ENCOUNTER — Emergency Department (HOSPITAL_COMMUNITY): Payer: MEDICAID

## 2024-04-29 ENCOUNTER — Emergency Department (HOSPITAL_COMMUNITY)
Admission: EM | Admit: 2024-04-29 | Discharge: 2024-04-30 | Disposition: A | Discharge status: Home / Self Care | Payer: MEDICAID | Attending: Emergency Medicine | Admitting: Emergency Medicine

## 2024-04-29 ENCOUNTER — Other Ambulatory Visit: Payer: Self-pay

## 2024-04-29 DIAGNOSIS — S0031XA Abrasion of nose, initial encounter: Secondary | ICD-10-CM | POA: Diagnosis not present

## 2024-04-29 DIAGNOSIS — R569 Unspecified convulsions: Secondary | ICD-10-CM | POA: Diagnosis present

## 2024-04-29 DIAGNOSIS — F10120 Alcohol abuse with intoxication, uncomplicated: Secondary | ICD-10-CM | POA: Diagnosis not present

## 2024-04-29 DIAGNOSIS — X58XXXA Exposure to other specified factors, initial encounter: Secondary | ICD-10-CM | POA: Diagnosis not present

## 2024-04-29 DIAGNOSIS — Y908 Blood alcohol level of 240 mg/100 ml or more: Secondary | ICD-10-CM | POA: Insufficient documentation

## 2024-04-29 DIAGNOSIS — S0990XA Unspecified injury of head, initial encounter: Secondary | ICD-10-CM | POA: Diagnosis present

## 2024-04-29 DIAGNOSIS — F10920 Alcohol use, unspecified with intoxication, uncomplicated: Secondary | ICD-10-CM | POA: Diagnosis not present

## 2024-04-29 DIAGNOSIS — R000 Tachycardia, unspecified: Secondary | ICD-10-CM | POA: Diagnosis not present

## 2024-04-29 LAB — ETHANOL: Alcohol, Ethyl (B): 373 mg/dL (ref <15)
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
## 2024-04-29 NOTE — Discharge Instructions (Signed)
 Return for any problem.  ?

## 2024-04-30 ENCOUNTER — Encounter (HOSPITAL_COMMUNITY): Payer: Self-pay | Admitting: *Deleted

## 2024-04-30 ENCOUNTER — Emergency Department (HOSPITAL_COMMUNITY): Payer: MEDICAID

## 2024-04-30 ENCOUNTER — Other Ambulatory Visit: Payer: Self-pay

## 2024-04-30 ENCOUNTER — Emergency Department (HOSPITAL_COMMUNITY)
Admission: EM | Admit: 2024-04-30 | Discharge: 2024-04-30 | Disposition: A | Discharge status: Home / Self Care | Payer: MEDICAID | Source: Home / Self Care | Attending: Emergency Medicine | Admitting: Emergency Medicine

## 2024-04-30 DIAGNOSIS — X58XXXA Exposure to other specified factors, initial encounter: Secondary | ICD-10-CM | POA: Insufficient documentation

## 2024-04-30 DIAGNOSIS — S0031XA Abrasion of nose, initial encounter: Secondary | ICD-10-CM | POA: Insufficient documentation

## 2024-04-30 DIAGNOSIS — Y908 Blood alcohol level of 240 mg/100 ml or more: Secondary | ICD-10-CM | POA: Insufficient documentation

## 2024-04-30 DIAGNOSIS — R569 Unspecified convulsions: Secondary | ICD-10-CM | POA: Insufficient documentation

## 2024-04-30 DIAGNOSIS — F10120 Alcohol abuse with intoxication, uncomplicated: Secondary | ICD-10-CM | POA: Insufficient documentation

## 2024-04-30 DIAGNOSIS — R000 Tachycardia, unspecified: Secondary | ICD-10-CM | POA: Insufficient documentation

## 2024-04-30 DIAGNOSIS — F10920 Alcohol use, unspecified with intoxication, uncomplicated: Secondary | ICD-10-CM

## 2024-04-30 LAB — I-STAT CHEM 8, ED
BUN: 18 mg/dL (ref 6–20)
Calcium, Ion: 1.07 mmol/L — ABNORMAL LOW (ref 1.15–1.40)
Chloride: 107 mmol/L (ref 98–111)
Creatinine, Ser: 1.2 mg/dL (ref 0.61–1.24)
Glucose, Bld: 115 mg/dL — ABNORMAL HIGH (ref 70–99)
HCT: 31 % — ABNORMAL LOW (ref 39.0–52.0)
Hemoglobin: 10.5 g/dL — ABNORMAL LOW (ref 13.0–17.0)
Potassium: 3.9 mmol/L (ref 3.5–5.1)
Sodium: 146 mmol/L — ABNORMAL HIGH (ref 135–145)
TCO2: 24 mmol/L (ref 22–32)

## 2024-04-30 LAB — CBC WITH DIFFERENTIAL/PLATELET
Abs Immature Granulocytes: 0.01 10*3/uL (ref 0.00–0.07)
Basophils Absolute: 0.1 10*3/uL (ref 0.0–0.1)
Basophils Relative: 2 %
Eosinophils Absolute: 0.1 10*3/uL (ref 0.0–0.5)
Eosinophils Relative: 1 %
HCT: 30.4 % — ABNORMAL LOW (ref 39.0–52.0)
Hemoglobin: 9.8 g/dL — ABNORMAL LOW (ref 13.0–17.0)
Immature Granulocytes: 0 %
Lymphocytes Relative: 43 %
Lymphs Abs: 2.1 10*3/uL (ref 0.7–4.0)
MCH: 30.3 pg (ref 26.0–34.0)
MCHC: 32.2 g/dL (ref 30.0–36.0)
MCV: 94.1 fL (ref 80.0–100.0)
Monocytes Absolute: 0.4 10*3/uL (ref 0.1–1.0)
Monocytes Relative: 8 %
Neutro Abs: 2.3 10*3/uL (ref 1.7–7.7)
Neutrophils Relative %: 46 %
Platelets: 404 10*3/uL — ABNORMAL HIGH (ref 150–400)
RBC: 3.23 MIL/uL — ABNORMAL LOW (ref 4.22–5.81)
RDW: 18.3 % — ABNORMAL HIGH (ref 11.5–15.5)
WBC: 5 10*3/uL (ref 4.0–10.5)
nRBC: 0 % (ref 0.0–0.2)

## 2024-04-30 LAB — ETHANOL: Alcohol, Ethyl (B): 371 mg/dL (ref <15)

## 2024-04-30 LAB — COMPREHENSIVE METABOLIC PANEL WITH GFR
ALT: 18 U/L (ref 0–44)
AST: 26 U/L (ref 15–41)
Albumin: 3.8 g/dL (ref 3.5–5.0)
Alkaline Phosphatase: 52 U/L (ref 38–126)
Anion gap: 12 (ref 5–15)
BUN: 14 mg/dL (ref 6–20)
CO2: 26 mmol/L (ref 22–32)
Calcium: 9.2 mg/dL (ref 8.9–10.3)
Chloride: 107 mmol/L (ref 98–111)
Creatinine, Ser: 0.75 mg/dL (ref 0.61–1.24)
GFR, Estimated: >60 mL/min (ref >60)
Glucose, Bld: 121 mg/dL — ABNORMAL HIGH (ref 70–99)
Potassium: 3.9 mmol/L (ref 3.5–5.1)
Sodium: 145 mmol/L (ref 135–145)
Total Bilirubin: 0.5 mg/dL (ref 0.0–1.2)
Total Protein: 7.2 g/dL (ref 6.5–8.1)

## 2024-04-30 LAB — MAGNESIUM: Magnesium: 2.4 mg/dL (ref 1.7–2.4)

## 2024-04-30 MED ORDER — LACTATED RINGERS IV BOLUS
1000.0000 mL | Freq: Once | INTRAVENOUS | Status: AC
  Administered 2024-04-30: 1000 mL via INTRAVENOUS

## 2024-04-30 MED ORDER — CHLORDIAZEPOXIDE HCL 25 MG PO CAPS
50.0000 mg | ORAL_CAPSULE | Freq: Once | ORAL | Status: AC
  Administered 2024-04-30: 50 mg via ORAL
  Filled 2024-04-30: qty 2

## 2024-04-30 MED ORDER — LEVETIRACETAM (KEPPRA) 500 MG/5 ML ADULT IV PUSH
1000.0000 mg | Freq: Once | INTRAVENOUS | Status: AC
  Administered 2024-04-30: 1000 mg via INTRAVENOUS
  Filled 2024-04-30: qty 10

## 2024-04-30 NOTE — ED Notes (Signed)
 While tech was getting updated vitals, patient expressed he felt that he was going through withdrawals. I went and asked patient the serial of questions and obtained CIWA. MD notified.

## 2024-04-30 NOTE — Discharge Instructions (Addendum)
 Follow-up with your primary care physician in regards to your seizure.  It is important to cut back on alcohol .  If you develop new or worsening headache, fever, or any other new/concerning symptoms and return to the ER.
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
## 2024-04-30 NOTE — ED Notes (Signed)
 Patient still sleeping in Gary [name] bed. No needs expressed at this time.

## 2024-04-30 NOTE — ED Notes (Signed)
 Patient refused set of vitals and the discharge paperwork. Patient was shown the exit.

## 2024-04-30 NOTE — ED Notes (Signed)
 Charge RN escorted pt back to hall a

## 2024-04-30 NOTE — ED Provider Notes (Signed)
 [place] EMERGENCY DEPARTMENT AT [facility] Provider Note   CSN: [id number] Arrival date & time: 04/30/24  1752     Patient presents with: Seizures   Gary [name] is a 38 y.o. male.   HPI 38 year old male brought in with seizures.  History is primarily from EMS but somewhat from the patient.  The patient reportedly was found outside and had 2 different seizures several minutes apart.  Unclear how long they lasted but sounded generalized per EMS.  Patient is homeless and has alcohol  abuse and reports that he has been outside all day.  Patient tells me that he has a history of seizures that are associated with alcohol .  He states he has been drinking alcohol  all day and been outside all day.  EMS reports his temp was 103 and so they applied ice packs.  He was transiently hypotensive and was tachycardic.  His O2 sats were in the high 80s so they put him on some oxygen.  Patient denies any specific complaints of pain though he does deal with chronic pain and chronic pancreatitis.  He denies any current vomiting.  Prior to Admission medications   Medication Sig Start Date End Date Taking? Authorizing Provider  bictegravir-emtricitabine -tenofovir  AF (BIKTARVY ) 50-200-25 MG TABS tablet Take 1 tablet by mouth daily. 02/24/24   Von Bellis, MD  feeding supplement (ENSURE PLUS HIGH PROTEIN) LIQD Take 237 mLs by mouth 2 (two) times daily between meals. 04/25/24   Bennett, Christal H, NP  folic acid  (FOLVITE ) 1 MG tablet Take 1 tablet (1 mg total) by mouth daily. 03/06/24   Dorinda Drue DASEN, MD  gabapentin  (NEURONTIN ) 100 MG capsule Take 2 capsules (200 mg total) by mouth 3 (three) times daily. 04/25/24   Bennett, Christal H, NP  Multiple Vitamin (MULTIVITAMIN WITH MINERALS) TABS tablet Take 1 tablet by mouth daily. 04/26/24   Bennett, Christal H, NP  nicotine  (NICODERM CQ  - DOSED IN MG/24 HOURS) 14 mg/24hr patch Place 1 patch (14 mg total) onto the skin daily. 04/26/24   Bennett, Christal H,  NP  Pancrelipase , Lip-Prot-Amyl, 24000-76000 units CPEP Take 1 capsule (24,000 Units total) by mouth 3 (three) times daily before meals. 04/25/24   Bennett, Christal H, NP  pantoprazole  (PROTONIX ) 40 MG tablet Take 1 tablet (40 mg total) by mouth 2 (two) times daily. Patient taking differently: Take 40 mg by mouth 2 (two) times daily as needed (Acid reflux). 03/05/24   Dorinda Drue DASEN, MD  QUEtiapine  (SEROQUEL ) 50 MG tablet Take 1 tablet (50 mg total) by mouth at bedtime. 04/25/24   Bennett, Christal H, NP  risperiDONE  (RISPERDAL  M-TABS) 2 MG disintegrating tablet Take 1 tablet (2 mg total) by mouth 2 (two) times daily. 04/25/24   Bennett, Christal H, NP  thiamine  (VITAMIN B-1) 100 MG tablet Take 1 tablet (100 mg total) by mouth daily. 03/06/24   Dorinda Drue DASEN, MD  famotidine  (PEPCID ) 20 MG tablet Take 1 tablet (20 mg total) by mouth 2 (two) times daily. Patient not taking: Reported on 06/01/2019 05/14/19 06/02/19  [name], MD    Allergies: Tegretol  [carbamazepine ] and Caffeine    Review of Systems  Unable to perform ROS: Other    Updated Vital Signs BP 106/68   Pulse 92   Temp 98.3 F (36.8 C) (Oral)   Resp (!) 21   Ht 6' (1.829 m)   Wt 62.1 kg   SpO2 97%   BMI 18.58 kg/m   Physical Exam Vitals and nursing note reviewed.  Constitutional:      Appearance: He is well-developed.  HENT:     Head: Normocephalic.     Comments: Abrasion over nose  Eyes:     Extraocular Movements: Extraocular movements intact.     Pupils: Pupils are equal, round, and reactive to light.    Cardiovascular:     Rate and Rhythm: Regular rhythm. Tachycardia present.     Heart sounds: Normal heart sounds.  Pulmonary:     Effort: Pulmonary effort is normal.     Breath sounds: Normal breath sounds.  Abdominal:     General: There is no distension.   Skin:    General: Skin is warm and dry.   Neurological:     Mental Status: He is alert.     Comments: Awake, alert, moves all 4 extremities  symmetrically.    (all labs ordered are listed, but only abnormal results are displayed) Labs Reviewed  COMPREHENSIVE METABOLIC PANEL WITH GFR - Abnormal; Notable for the following components:      Result Value   Glucose, Bld 121 (*)    All other components within normal limits  CBC WITH DIFFERENTIAL/PLATELET - Abnormal; Notable for the following components:   RBC 3.23 (*)    Hemoglobin 9.8 (*)    HCT 30.4 (*)    RDW 18.3 (*)    Platelets 404 (*)    All other components within normal limits  ETHANOL - Abnormal; Notable for the following components:   Alcohol , Ethyl (B) 371 (*)    All other components within normal limits  I-STAT CHEM 8, ED - Abnormal; Notable for the following components:   Sodium 146 (*)    Glucose, Bld 115 (*)    Calcium , Ion 1.07 (*)    Hemoglobin 10.5 (*)    HCT 31.0 (*)    All other components within normal limits  MAGNESIUM   RAPID URINE DRUG SCREEN, HOSP PERFORMED  URINALYSIS, ROUTINE W REFLEX MICROSCOPIC  CBG MONITORING, ED    EKG: EKG Interpretation Date/Time:  Sunday April 30 2024 18:06:45 EDT Ventricular Rate:  104 PR Interval:  148 QRS Duration:  98 QT Interval:  351 QTC Calculation: 462 R Axis:   74  Text Interpretation: Sinus tachycardia similar to April 16 2024 Confirmed by Freddi Hamilton [phone]) on 04/30/2024 8:08:55 PM  Radiology: CT Cervical Spine Wo Contrast Result Date: 04/30/2024 CLINICAL DATA:  Status post trauma. EXAM: CT CERVICAL SPINE WITHOUT CONTRAST TECHNIQUE: Multidetector CT imaging of the cervical spine was performed without intravenous contrast. Multiplanar CT image reconstructions were also generated. RADIATION DOSE REDUCTION: This exam was performed according to the departmental dose-optimization program which includes automated exposure control, adjustment of the mA and/or kV according to patient size and/or use of iterative reconstruction technique. COMPARISON:  January 20, 2023 FINDINGS: Alignment: Normal. Skull base and  vertebrae: No acute fracture. No primary bone lesion or focal pathologic process. Soft tissues and spinal canal: No prevertebral fluid or swelling. No visible canal hematoma. Disc levels: Very mild anterior osteophyte formation is seen at the levels of C3-C4, C4-C5, C5-C6 and C6-C7. Moderate severity posterior intervertebral disc space narrowing is seen at the levels of C4-C5, C5-C6 and C6-C7. Mild, bilateral multilevel facet joint hypertrophy is noted. Upper chest: Negative. Other: None. IMPRESSION: 1. No acute fracture or subluxation in the cervical spine. 2. Mild to moderate severity degenerative changes, as described above. Electronically Signed   By: Suzen Dials M.D.   On: 04/30/2024 18:48   CT Head Wo Contrast Result Date:  04/30/2024 CLINICAL DATA:  Status post seizure. EXAM: CT HEAD WITHOUT CONTRAST TECHNIQUE: Contiguous axial images were obtained from the base of the skull through the vertex without intravenous contrast. RADIATION DOSE REDUCTION: This exam was performed according to the departmental dose-optimization program which includes automated exposure control, adjustment of the mA and/or kV according to patient size and/or use of iterative reconstruction technique. COMPARISON:  April 29, 2024 FINDINGS: Brain: No evidence of acute infarction, hemorrhage, hydrocephalus, extra-axial collection or mass lesion/mass effect. Vascular: No hyperdense vessel or unexpected calcification. Skull: Normal. Negative for fracture or focal lesion. Sinuses/Orbits: No acute finding. Other: None. IMPRESSION: No acute intracranial pathology. Electronically Signed   By: Suzen Dials M.D.   On: 04/30/2024 18:45   DG Chest Portable 1 View Result Date: 04/30/2024 CLINICAL DATA:  Seizures. EXAM: PORTABLE CHEST 1 VIEW COMPARISON:  March 01, 2024 FINDINGS: The heart size and mediastinal contours are within normal limits. There is prominence of the central pulmonary vasculature. Low lung volumes are seen with mild,  stable elevation of the left hemidiaphragm. No focal consolidation, pleural effusion or pneumothorax is identified. There is mild dextroscoliosis of the mid to lower thoracic spine. IMPRESSION: Low lung volumes without evidence of acute or active cardiopulmonary disease. Electronically Signed   By: Suzen Dials M.D.   On: 04/30/2024 18:30   CT Head Wo Contrast Result Date: 04/29/2024 CLINICAL DATA:  Head trauma, moderate-severe EXAM: CT HEAD WITHOUT CONTRAST TECHNIQUE: Contiguous axial images were obtained from the base of the skull through the vertex without intravenous contrast. RADIATION DOSE REDUCTION: This exam was performed according to the departmental dose-optimization program which includes automated exposure control, adjustment of the mA and/or kV according to patient size and/or use of iterative reconstruction technique. COMPARISON:  02/03/2024. FINDINGS: Brain: No evidence of acute infarction, hemorrhage, hydrocephalus, extra-axial collection or mass lesion/mass effect. Vascular: No hyperdense vessel or unexpected calcification. Skull: Normal. Negative for fracture or focal lesion. Sinuses/Orbits: No acute finding. IMPRESSION: No acute intracranial process. Electronically Signed   By: Fonda Field M.D.   On: 04/29/2024 21:46     Procedures   Medications Ordered in the ED  lactated ringers  bolus 1,000 mL (0 mLs Intravenous Stopped 04/30/24 1841)  levETIRAcetam  (KEPPRA ) undiluted injection 1,000 mg (1,000 mg Intravenous Given 04/30/24 1938)  chlordiazePOXIDE  (LIBRIUM ) capsule 50 mg (50 mg Oral Given 04/30/24 2240)                                    Medical Decision Making Amount and/or Complexity of Data Reviewed Labs: ordered.    Details: Alcohol  371 Radiology: ordered.    Details: No head bleed ECG/medicine tests: ordered and independent interpretation performed.    Details: No ischemia  Risk Prescription drug management.   Patient has been observed in the ED for  almost 5 hours with no recurrent seizure-like activity.  He was given a dose of Keppra  as he states that he is supposed to be on Keppra  and has a history of seizures.  He is also acutely intoxicated on arrival.  EMS reported a significant fever of 103 but he has not had a fever here and this was probably more environmental.  Patient states he has been otherwise feeling well.  He was reassessed when sober and indicates that he feels fine.  I did discuss the potential of an LP as he had a fever and seizure but he declines.  He is  no longer intoxicated I think able to decline this after discussion of my concern for the need to rule out meningitis/encephalitis.  He does state that he feels like he might be starting to have some withdrawals though he is otherwise looking well.  Will give a dose of Librium .  He states he is not planning on stopping alcohol  and does not want a prescription.  He does not feel like he can afford a Keppra  prescription.  He was discharged with return precautions.     Final diagnoses:  Seizure [facility])  Alcoholic intoxication without complication [facility])    ED Discharge Orders     None          Freddi Hamilton, MD 04/30/24 [phone]

## 2024-04-30 NOTE — ED Notes (Signed)
 Pt to the secretary desk stating he was leaving to go drink before he goes in withdrawals. Provider made aware of pt wish to leave. Pt seen walking to exit.

## 2024-05-02 ENCOUNTER — Emergency Department (HOSPITAL_COMMUNITY): Payer: MEDICAID

## 2024-05-02 ENCOUNTER — Other Ambulatory Visit: Payer: Self-pay

## 2024-05-02 ENCOUNTER — Emergency Department (HOSPITAL_COMMUNITY)
Admission: EM | Admit: 2024-05-02 | Discharge: 2024-05-02 | Disposition: A | Discharge status: Home / Self Care | Payer: MEDICAID | Attending: Emergency Medicine | Admitting: Emergency Medicine

## 2024-05-02 DIAGNOSIS — F10120 Alcohol abuse with intoxication, uncomplicated: Secondary | ICD-10-CM | POA: Diagnosis not present

## 2024-05-02 DIAGNOSIS — Y908 Blood alcohol level of 240 mg/100 ml or more: Secondary | ICD-10-CM | POA: Diagnosis not present

## 2024-05-02 DIAGNOSIS — R4182 Altered mental status, unspecified: Secondary | ICD-10-CM | POA: Diagnosis present

## 2024-05-02 DIAGNOSIS — F10920 Alcohol use, unspecified with intoxication, uncomplicated: Secondary | ICD-10-CM

## 2024-05-02 DIAGNOSIS — R21 Rash and other nonspecific skin eruption: Secondary | ICD-10-CM | POA: Insufficient documentation

## 2024-05-02 DIAGNOSIS — E860 Dehydration: Secondary | ICD-10-CM | POA: Insufficient documentation

## 2024-05-02 LAB — RAPID URINE DRUG SCREEN, HOSP PERFORMED
Amphetamines: NOT DETECTED
Barbiturates: NOT DETECTED
Benzodiazepines: POSITIVE — AB
Cocaine: NOT DETECTED
Opiates: NOT DETECTED
Tetrahydrocannabinol: NOT DETECTED

## 2024-05-02 LAB — COMPREHENSIVE METABOLIC PANEL WITH GFR
ALT: 19 U/L (ref 0–44)
AST: 25 U/L (ref 15–41)
Albumin: 3.6 g/dL (ref 3.5–5.0)
Alkaline Phosphatase: 55 U/L (ref 38–126)
Anion gap: 12 (ref 5–15)
BUN: 13 mg/dL (ref 6–20)
CO2: 24 mmol/L (ref 22–32)
Calcium: 7.9 mg/dL — ABNORMAL LOW (ref 8.9–10.3)
Chloride: 106 mmol/L (ref 98–111)
Creatinine, Ser: 0.55 mg/dL — ABNORMAL LOW (ref 0.61–1.24)
GFR, Estimated: >60 mL/min (ref >60)
Glucose, Bld: 90 mg/dL (ref 70–99)
Potassium: 3.4 mmol/L — ABNORMAL LOW (ref 3.5–5.1)
Sodium: 142 mmol/L (ref 135–145)
Total Bilirubin: 0.5 mg/dL (ref 0.0–1.2)
Total Protein: 6.8 g/dL (ref 6.5–8.1)

## 2024-05-02 LAB — CBC WITH DIFFERENTIAL/PLATELET
Abs Immature Granulocytes: 0.01 10*3/uL (ref 0.00–0.07)
Basophils Absolute: 0.1 10*3/uL (ref 0.0–0.1)
Basophils Relative: 2 %
Eosinophils Absolute: 0.1 10*3/uL (ref 0.0–0.5)
Eosinophils Relative: 4 %
HCT: 30.3 % — ABNORMAL LOW (ref 39.0–52.0)
Hemoglobin: 9.8 g/dL — ABNORMAL LOW (ref 13.0–17.0)
Immature Granulocytes: 0 %
Lymphocytes Relative: 45 %
Lymphs Abs: 1.7 10*3/uL (ref 0.7–4.0)
MCH: 30.2 pg (ref 26.0–34.0)
MCHC: 32.3 g/dL (ref 30.0–36.0)
MCV: 93.5 fL (ref 80.0–100.0)
Monocytes Absolute: 0.2 10*3/uL (ref 0.1–1.0)
Monocytes Relative: 5 %
Neutro Abs: 1.7 10*3/uL (ref 1.7–7.7)
Neutrophils Relative %: 44 %
Platelets: 349 10*3/uL (ref 150–400)
RBC: 3.24 MIL/uL — ABNORMAL LOW (ref 4.22–5.81)
RDW: 18.4 % — ABNORMAL HIGH (ref 11.5–15.5)
WBC: 3.8 10*3/uL — ABNORMAL LOW (ref 4.0–10.5)
nRBC: 0 % (ref 0.0–0.2)

## 2024-05-02 LAB — CK: Total CK: 135 U/L (ref 49–397)

## 2024-05-02 LAB — TROPONIN I (HIGH SENSITIVITY): Troponin I (High Sensitivity): 4 ng/L (ref <18)

## 2024-05-02 LAB — ETHANOL: Alcohol, Ethyl (B): 312 mg/dL (ref <15)

## 2024-05-02 MED ORDER — SODIUM CHLORIDE 0.9 % IV BOLUS
1000.0000 mL | Freq: Once | INTRAVENOUS | Status: AC
  Administered 2024-05-02: 1000 mL via INTRAVENOUS

## 2024-05-02 MED ORDER — LEVETIRACETAM (KEPPRA) 500 MG/5 ML ADULT IV PUSH
1000.0000 mg | Freq: Once | INTRAVENOUS | Status: AC
  Administered 2024-05-02: 1000 mg via INTRAVENOUS
  Filled 2024-05-02: qty 10
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
## 2024-05-06 ENCOUNTER — Encounter (HOSPITAL_COMMUNITY): Payer: Self-pay | Admitting: *Deleted

## 2024-05-06 ENCOUNTER — Other Ambulatory Visit: Payer: Self-pay

## 2024-05-06 ENCOUNTER — Emergency Department (HOSPITAL_COMMUNITY)
Admission: EM | Admit: 2024-05-06 | Discharge: 2024-05-07 | Disposition: A | Discharge status: Home / Self Care | Payer: MEDICAID | Source: Home / Self Care | Attending: Emergency Medicine | Admitting: Emergency Medicine

## 2024-05-06 DIAGNOSIS — Z5900 Homelessness unspecified: Secondary | ICD-10-CM | POA: Insufficient documentation

## 2024-05-06 DIAGNOSIS — R52 Pain, unspecified: Secondary | ICD-10-CM

## 2024-05-06 DIAGNOSIS — Y908 Blood alcohol level of 240 mg/100 ml or more: Secondary | ICD-10-CM | POA: Insufficient documentation

## 2024-05-06 DIAGNOSIS — F10120 Alcohol abuse with intoxication, uncomplicated: Secondary | ICD-10-CM | POA: Insufficient documentation

## 2024-05-06 DIAGNOSIS — Z21 Asymptomatic human immunodeficiency virus [HIV] infection status: Secondary | ICD-10-CM | POA: Insufficient documentation

## 2024-05-06 DIAGNOSIS — M791 Myalgia, unspecified site: Secondary | ICD-10-CM | POA: Insufficient documentation

## 2024-05-06 DIAGNOSIS — F10920 Alcohol use, unspecified with intoxication, uncomplicated: Secondary | ICD-10-CM

## 2024-05-06 LAB — LIPASE, BLOOD: Lipase: 46 U/L (ref 11–51)

## 2024-05-06 LAB — CBC
HCT: 31.9 % — ABNORMAL LOW (ref 39.0–52.0)
Hemoglobin: 10.2 g/dL — ABNORMAL LOW (ref 13.0–17.0)
MCH: 29.2 pg (ref 26.0–34.0)
MCHC: 32 g/dL (ref 30.0–36.0)
MCV: 91.4 fL (ref 80.0–100.0)
Platelets: 236 10*3/uL (ref 150–400)
RBC: 3.49 MIL/uL — ABNORMAL LOW (ref 4.22–5.81)
RDW: 18.6 % — ABNORMAL HIGH (ref 11.5–15.5)
WBC: 3.4 10*3/uL — ABNORMAL LOW (ref 4.0–10.5)
nRBC: 0 % (ref 0.0–0.2)

## 2024-05-06 LAB — COMPREHENSIVE METABOLIC PANEL WITH GFR
ALT: 33 U/L (ref 0–44)
AST: 47 U/L — ABNORMAL HIGH (ref 15–41)
Albumin: 3.7 g/dL (ref 3.5–5.0)
Alkaline Phosphatase: 50 U/L (ref 38–126)
Anion gap: 11 (ref 5–15)
BUN: 9 mg/dL (ref 6–20)
CO2: 22 mmol/L (ref 22–32)
Calcium: 8.5 mg/dL — ABNORMAL LOW (ref 8.9–10.3)
Chloride: 107 mmol/L (ref 98–111)
Creatinine, Ser: 0.57 mg/dL — ABNORMAL LOW (ref 0.61–1.24)
GFR, Estimated: >60 mL/min (ref >60)
Glucose, Bld: 114 mg/dL — ABNORMAL HIGH (ref 70–99)
Potassium: 4 mmol/L (ref 3.5–5.1)
Sodium: 140 mmol/L (ref 135–145)
Total Bilirubin: 0.3 mg/dL (ref 0.0–1.2)
Total Protein: 6.7 g/dL (ref 6.5–8.1)

## 2024-05-06 LAB — ETHANOL: Alcohol, Ethyl (B): 368 mg/dL (ref <15)
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
## 2024-05-06 NOTE — ED Provider Triage Note (Signed)
 Emergency Medicine Provider Triage Evaluation Note  Gary [name] , a 38 y.o. male  was evaluated in triage.  Pt complains of abdominal pain. Symptoms began today after he drank more alcohol  than usual.  Review of Systems  Positive:  Negative:   Physical Exam  BP 103/79 (BP Location: Right Arm)   Pulse 90   Temp 98.2 F (36.8 C) (Oral)   Resp 16   Ht 6' (1.829 m)   Wt 62.1 kg   SpO2 100%   BMI 18.58 kg/m  Gen:   Awake, no distress   Resp:  Normal effort  MSK:   Moves extremities without difficulty  Other:    Medical Decision Making  Medically screening exam initiated at 8:47 PM.  Appropriate orders placed.  Gary [name] was informed that the remainder of the evaluation will be completed by another provider, this initial triage assessment does not replace that evaluation, and the importance of remaining in the ED until their evaluation is complete.     Nora Lauraine LABOR, PA-C 05/06/24 2048

## 2024-05-07 MED ORDER — PANCRELIPASE (LIP-PROT-AMYL) 24000-76000 UNITS PO CPEP: 24000.0000 [IU] | ORAL_CAPSULE | Freq: Three times a day (TID) | ORAL | 0 refills | Status: DC

## 2024-05-07 NOTE — Discharge Instructions (Signed)
 Your workup this evening was reassuring.  Please resume your home medications.  I sent a refill of your pancrelipase  to the pharmacy.  If you develop any life threatening symptoms return to the emergency department.

## 2024-05-07 NOTE — ED Provider Notes (Signed)
 [name] [facility] DE[facility]MENT AT [facility] Provider Note   CSN: [id number] Arrival date & time: 05/06/24  1925     Patient presents with: Generalized Body Aches   Gary [name] is a 38 y.o. male.  Patient well-known to this department with 16 emergency department visits in the past 6 months, history of alcohol  abuse, polysubstance abuse, HIV, chronic pancreatitis, seizures, schizophrenia presents to the emergency department via EMS complaining of generalized bodyaches and generalized abdominal pain.  The patient states he drank more than normal today.  He was initially seen lying on the side of the road by a passerby.  Patient is homeless.  He says that he is out of his medicine that he takes for his chronic pancreatitis.  At the time of my assessment patient is resting comfortably, sleeping, in no acute distress.  He denies chest pain, shortness of breath, nausea, vomiting.   HPI     Prior to Admission medications   Medication Sig Start Date End Date Taking? Authorizing Provider  bictegravir-emtricitabine -tenofovir  AF (BIKTARVY ) 50-200-25 MG TABS tablet Take 1 tablet by mouth daily. 02/24/24   Von Bellis, MD  feeding supplement (ENSURE PLUS HIGH PROTEIN) LIQD Take 237 mLs by mouth 2 (two) times daily between meals. 04/25/24   Bennett, Christal H, NP  folic acid  (FOLVITE ) 1 MG tablet Take 1 tablet (1 mg total) by mouth daily. 03/06/24   Dorinda Drue DASEN, MD  gabapentin  (NEURONTIN ) 100 MG capsule Take 2 capsules (200 mg total) by mouth 3 (three) times daily. 04/25/24   Bennett, Christal H, NP  Multiple Vitamin (MULTIVITAMIN WITH MINERALS) TABS tablet Take 1 tablet by mouth daily. 04/26/24   Bennett, Christal H, NP  nicotine  (NICODERM CQ  - DOSED IN MG/24 HOURS) 14 mg/24hr patch Place 1 patch (14 mg total) onto the skin daily. 04/26/24   Bennett, Christal H, NP  Pancrelipase , Lip-Prot-Amyl, 24000-76000 units CPEP Take 1 capsule (24,000 Units total) by mouth 3 (three) times daily  before meals. 05/07/24   Logan Ubaldo NOVAK, PA-C  pantoprazole  (PROTONIX ) 40 MG tablet Take 1 tablet (40 mg total) by mouth 2 (two) times daily. Patient taking differently: Take 40 mg by mouth 2 (two) times daily as needed (Acid reflux). 03/05/24   Dorinda Drue DASEN, MD  QUEtiapine  (SEROQUEL ) 50 MG tablet Take 1 tablet (50 mg total) by mouth at bedtime. 04/25/24   Bennett, Christal H, NP  risperiDONE  (RISPERDAL  M-TABS) 2 MG disintegrating tablet Take 1 tablet (2 mg total) by mouth 2 (two) times daily. 04/25/24   Bennett, Christal H, NP  thiamine  (VITAMIN B-1) 100 MG tablet Take 1 tablet (100 mg total) by mouth daily. 03/06/24   Dorinda Drue DASEN, MD  famotidine  (PEPCID ) 20 MG tablet Take 1 tablet (20 mg total) by mouth 2 (two) times daily. Patient not taking: Reported on 06/01/2019 05/14/19 06/02/19  [name], MD    Allergies: Tegretol  [carbamazepine ] and Caffeine    Review of Systems  Updated Vital Signs BP 103/79 (BP Location: Right Arm)   Pulse 90   Temp 98.2 F (36.8 C) (Oral)   Resp 16   Ht 6' (1.829 m)   Wt 62.1 kg   SpO2 100%   BMI 18.58 kg/m   Physical Exam Vitals and nursing note reviewed.  Constitutional:      General: He is not in acute distress.    Appearance: He is well-developed. He is not ill-appearing, toxic-appearing or diaphoretic.  HENT:     Nose: No congestion or  rhinorrhea.     Mouth/Throat:     Mouth: Mucous membranes are moist.   Eyes:     Extraocular Movements: Extraocular movements intact.     Conjunctiva/sclera: Conjunctivae normal.     Pupils: Pupils are equal, round, and reactive to light.    Cardiovascular:     Rate and Rhythm: Normal rate and regular rhythm.     Heart sounds: No murmur heard. Pulmonary:     Effort: Pulmonary effort is normal. No respiratory distress.     Breath sounds: Normal breath sounds. No wheezing, rhonchi or rales.  Chest:     Chest wall: No tenderness.  Abdominal:     General: Abdomen is flat.     Palpations: Abdomen  is soft.     Tenderness: There is no abdominal tenderness. There is no guarding or rebound.   Musculoskeletal:        General: No swelling.     Cervical back: Neck supple. No tenderness.   Skin:    General: Skin is warm and dry.     Capillary Refill: Capillary refill takes less than 2 seconds.   Neurological:     General: No focal deficit present.     Mental Status: He is alert.     Sensory: No sensory deficit.     Motor: No weakness.   Psychiatric:        Mood and Affect: Mood normal.     (all labs ordered are listed, but only abnormal results are displayed) Labs Reviewed  COMPREHENSIVE METABOLIC PANEL WITH GFR - Abnormal; Notable for the following components:      Result Value   Glucose, Bld 114 (*)    Creatinine, Ser 0.57 (*)    Calcium  8.5 (*)    AST 47 (*)    All other components within normal limits  CBC - Abnormal; Notable for the following components:   WBC 3.4 (*)    RBC 3.49 (*)    Hemoglobin 10.2 (*)    HCT 31.9 (*)    RDW 18.6 (*)    All other components within normal limits  ETHANOL - Abnormal; Notable for the following components:   Alcohol , Ethyl (B) 368 (*)    All other components within normal limits  LIPASE, BLOOD  URINALYSIS, ROUTINE W REFLEX MICROSCOPIC    EKG: None  Radiology: No results found.   Procedures   Medications Ordered in the ED - No data to display                                  Medical Decision Making Amount and/or Complexity of Data Reviewed Labs: ordered.   This patient presents to the ED for concern of generalized bodyaches and abdominal pain, this involves an extensive number of treatment options, and is a complaint that carries with it a high risk of complications and morbidity.  The differential diagnosis includes dehydration, alcohol  withdrawal, pancreatitis, gastritis, cholecystitis, appendicitis, others   Co morbidities / Chronic conditions that complicate the patient evaluation  Chronic pancreatitis,  polysubstance abuse   Additional history obtained:  Additional history obtained from EMR External records from outside source obtained and reviewed including recent discharge summary after patient was admitted due to alcohol  withdrawal   Lab Tests:  I Ordered, and personally interpreted labs.  The pertinent results include: Ethanol 368, lipase 46; CBC and CMP appear to be at baseline   Imaging Studies ordered:  Patient  resting comfortably, no abdominal tenderness on exam.  No indication for emergent abdominal imaging.  No sign of surgical/acute abdomen   Social Determinants of Health:  Patient is homeless   Test / Admission - Considered:  Patient drinking liquids at bedside without difficulty.  He is requesting a refill of his pancrelipase  which will be sent to his pharmacy.  Presentation not consistent with acute on chronic pancreatitis, cholecystitis, appendicitis.  Soft, nontender abdomen.  Patient showing no signs of withdrawal at this time.  He has ambulated without difficulty.  Plan to discharge at this time.  No indication for admission.  Return precautions provided      Final diagnoses:  Body aches  Alcoholic intoxication without complication [facility])    ED Discharge Orders          Ordered    Pancrelipase , Lip-Prot-Amyl, 24000-76000 units CPEP  3 times daily before meals        05/07/24 0205               Logan Ubaldo NOVAK, PA-C 05/07/24 0206    Jerral Meth, MD 05/07/24 [phone]

## 2024-05-08 ENCOUNTER — Other Ambulatory Visit (HOSPITAL_COMMUNITY): Payer: Self-pay

## 2024-05-08 ENCOUNTER — Emergency Department (HOSPITAL_COMMUNITY): Payer: MEDICAID

## 2024-05-08 ENCOUNTER — Encounter (HOSPITAL_COMMUNITY): Payer: Self-pay | Admitting: Radiology

## 2024-05-08 ENCOUNTER — Other Ambulatory Visit: Payer: Self-pay

## 2024-05-08 ENCOUNTER — Inpatient Hospital Stay (HOSPITAL_COMMUNITY)
Admission: EM | Admit: 2024-05-08 | Discharge: 2024-05-09 | DRG: 439 | Discharge status: Against Medical Advice | Payer: MEDICAID | Attending: Internal Medicine | Admitting: Internal Medicine

## 2024-05-08 DIAGNOSIS — D696 Thrombocytopenia, unspecified: Secondary | ICD-10-CM | POA: Diagnosis present

## 2024-05-08 DIAGNOSIS — Z5900 Homelessness unspecified: Secondary | ICD-10-CM

## 2024-05-08 DIAGNOSIS — F10120 Alcohol abuse with intoxication, uncomplicated: Secondary | ICD-10-CM | POA: Diagnosis present

## 2024-05-08 DIAGNOSIS — F10920 Alcohol use, unspecified with intoxication, uncomplicated: Principal | ICD-10-CM

## 2024-05-08 DIAGNOSIS — D638 Anemia in other chronic diseases classified elsewhere: Secondary | ICD-10-CM | POA: Diagnosis present

## 2024-05-08 DIAGNOSIS — K760 Fatty (change of) liver, not elsewhere classified: Secondary | ICD-10-CM | POA: Diagnosis present

## 2024-05-08 DIAGNOSIS — F3181 Bipolar II disorder: Secondary | ICD-10-CM | POA: Diagnosis present

## 2024-05-08 DIAGNOSIS — R64 Cachexia: Secondary | ICD-10-CM | POA: Diagnosis present

## 2024-05-08 DIAGNOSIS — Z8249 Family history of ischemic heart disease and other diseases of the circulatory system: Secondary | ICD-10-CM | POA: Diagnosis not present

## 2024-05-08 DIAGNOSIS — R1013 Epigastric pain: Secondary | ICD-10-CM | POA: Diagnosis present

## 2024-05-08 DIAGNOSIS — B20 Human immunodeficiency virus [HIV] disease: Secondary | ICD-10-CM | POA: Diagnosis present

## 2024-05-08 DIAGNOSIS — F319 Bipolar disorder, unspecified: Secondary | ICD-10-CM | POA: Diagnosis present

## 2024-05-08 DIAGNOSIS — K8500 Idiopathic acute pancreatitis without necrosis or infection: Secondary | ICD-10-CM | POA: Diagnosis not present

## 2024-05-08 DIAGNOSIS — Z800 Family history of malignant neoplasm of digestive organs: Secondary | ICD-10-CM | POA: Diagnosis not present

## 2024-05-08 DIAGNOSIS — K859 Acute pancreatitis without necrosis or infection, unspecified: Secondary | ICD-10-CM | POA: Diagnosis present

## 2024-05-08 DIAGNOSIS — Z681 Body mass index (BMI) 19 or less, adult: Secondary | ICD-10-CM

## 2024-05-08 DIAGNOSIS — Z91199 Patient's noncompliance with other medical treatment and regimen due to unspecified reason: Secondary | ICD-10-CM | POA: Diagnosis not present

## 2024-05-08 DIAGNOSIS — D61818 Other pancytopenia: Secondary | ICD-10-CM | POA: Diagnosis present

## 2024-05-08 DIAGNOSIS — R6510 Systemic inflammatory response syndrome (SIRS) of non-infectious origin without acute organ dysfunction: Secondary | ICD-10-CM | POA: Diagnosis present

## 2024-05-08 DIAGNOSIS — F17210 Nicotine dependence, cigarettes, uncomplicated: Secondary | ICD-10-CM | POA: Diagnosis present

## 2024-05-08 DIAGNOSIS — I728 Aneurysm of other specified arteries: Secondary | ICD-10-CM | POA: Diagnosis present

## 2024-05-08 DIAGNOSIS — Y908 Blood alcohol level of 240 mg/100 ml or more: Secondary | ICD-10-CM | POA: Diagnosis present

## 2024-05-08 DIAGNOSIS — K860 Alcohol-induced chronic pancreatitis: Secondary | ICD-10-CM | POA: Diagnosis present

## 2024-05-08 DIAGNOSIS — K8591 Acute pancreatitis with uninfected necrosis, unspecified: Secondary | ICD-10-CM | POA: Diagnosis present

## 2024-05-08 DIAGNOSIS — Z811 Family history of alcohol abuse and dependence: Secondary | ICD-10-CM

## 2024-05-08 DIAGNOSIS — F1910 Other psychoactive substance abuse, uncomplicated: Secondary | ICD-10-CM | POA: Diagnosis present

## 2024-05-08 DIAGNOSIS — Z6372 Alcoholism and drug addiction in family: Secondary | ICD-10-CM

## 2024-05-08 DIAGNOSIS — Z888 Allergy status to other drugs, medicaments and biological substances status: Secondary | ICD-10-CM

## 2024-05-08 DIAGNOSIS — Z79899 Other long term (current) drug therapy: Secondary | ICD-10-CM | POA: Diagnosis not present

## 2024-05-08 DIAGNOSIS — F419 Anxiety disorder, unspecified: Secondary | ICD-10-CM | POA: Diagnosis present

## 2024-05-08 DIAGNOSIS — Z5329 Procedure and treatment not carried out because of patient's decision for other reasons: Secondary | ICD-10-CM | POA: Diagnosis present

## 2024-05-08 DIAGNOSIS — R54 Age-related physical debility: Secondary | ICD-10-CM | POA: Diagnosis present

## 2024-05-08 DIAGNOSIS — K852 Alcohol induced acute pancreatitis without necrosis or infection: Principal | ICD-10-CM | POA: Diagnosis present

## 2024-05-08 DIAGNOSIS — F10229 Alcohol dependence with intoxication, unspecified: Secondary | ICD-10-CM | POA: Diagnosis present

## 2024-05-08 DIAGNOSIS — Z21 Asymptomatic human immunodeficiency virus [HIV] infection status: Secondary | ICD-10-CM | POA: Diagnosis present

## 2024-05-08 LAB — COMPREHENSIVE METABOLIC PANEL WITH GFR
ALT: 34 U/L (ref 0–44)
AST: 40 U/L (ref 15–41)
Albumin: 3.9 g/dL (ref 3.5–5.0)
Alkaline Phosphatase: 57 U/L (ref 38–126)
Anion gap: 12 (ref 5–15)
BUN: 10 mg/dL (ref 6–20)
CO2: 22 mmol/L (ref 22–32)
Calcium: 8.4 mg/dL — ABNORMAL LOW (ref 8.9–10.3)
Chloride: 105 mmol/L (ref 98–111)
Creatinine, Ser: 0.32 mg/dL — ABNORMAL LOW (ref 0.61–1.24)
GFR, Estimated: >60 mL/min (ref >60)
Glucose, Bld: 158 mg/dL — ABNORMAL HIGH (ref 70–99)
Potassium: 3.8 mmol/L (ref 3.5–5.1)
Sodium: 139 mmol/L (ref 135–145)
Total Bilirubin: 0.5 mg/dL (ref 0.0–1.2)
Total Protein: 6.9 g/dL (ref 6.5–8.1)

## 2024-05-08 LAB — CBC WITH DIFFERENTIAL/PLATELET
Abs Immature Granulocytes: 0.01 10*3/uL (ref 0.00–0.07)
Basophils Absolute: 0 10*3/uL (ref 0.0–0.1)
Basophils Relative: 1 %
Eosinophils Absolute: 0.1 10*3/uL (ref 0.0–0.5)
Eosinophils Relative: 4 %
HCT: 32.7 % — ABNORMAL LOW (ref 39.0–52.0)
Hemoglobin: 10.5 g/dL — ABNORMAL LOW (ref 13.0–17.0)
Immature Granulocytes: 0 %
Lymphocytes Relative: 39 %
Lymphs Abs: 1.3 10*3/uL (ref 0.7–4.0)
MCH: 29.2 pg (ref 26.0–34.0)
MCHC: 32.1 g/dL (ref 30.0–36.0)
MCV: 91.1 fL (ref 80.0–100.0)
Monocytes Absolute: 0.1 10*3/uL (ref 0.1–1.0)
Monocytes Relative: 4 %
Neutro Abs: 1.7 10*3/uL (ref 1.7–7.7)
Neutrophils Relative %: 52 %
Platelets: 143 10*3/uL — ABNORMAL LOW (ref 150–400)
RBC: 3.59 MIL/uL — ABNORMAL LOW (ref 4.22–5.81)
RDW: 18.6 % — ABNORMAL HIGH (ref 11.5–15.5)
WBC: 3.2 10*3/uL — ABNORMAL LOW (ref 4.0–10.5)
nRBC: 0 % (ref 0.0–0.2)

## 2024-05-08 LAB — ETHANOL: Alcohol, Ethyl (B): 411 mg/dL (ref <15)

## 2024-05-08 LAB — LIPASE, BLOOD: Lipase: 39 U/L (ref 11–51)

## 2024-05-08 LAB — MAGNESIUM: Magnesium: 2.3 mg/dL (ref 1.7–2.4)

## 2024-05-08 MED ORDER — LORAZEPAM 1 MG PO TABS: 0.0000 mg | ORAL_TABLET | Freq: Two times a day (BID) | ORAL | Status: DC

## 2024-05-08 MED ORDER — ACETAMINOPHEN 325 MG PO TABS: 650.0000 mg | ORAL_TABLET | Freq: Four times a day (QID) | ORAL | Status: DC | PRN

## 2024-05-08 MED ORDER — IOHEXOL 300 MG/ML  SOLN
100.0000 mL | Freq: Once | INTRAMUSCULAR | Status: AC | PRN
  Administered 2024-05-08: 100 mL via INTRAVENOUS

## 2024-05-08 MED ORDER — SODIUM CHLORIDE 0.9 % IV BOLUS
1000.0000 mL | Freq: Once | INTRAVENOUS | Status: AC
  Administered 2024-05-08: 1000 mL via INTRAVENOUS

## 2024-05-08 MED ORDER — LORAZEPAM 2 MG/ML IJ SOLN
0.0000 mg | Freq: Four times a day (QID) | INTRAMUSCULAR | Status: DC
  Administered 2024-05-08: 3 mg via INTRAVENOUS
  Administered 2024-05-09 (×2): 2 mg via INTRAVENOUS
  Filled 2024-05-08 (×2): qty 1
  Filled 2024-05-08: qty 2

## 2024-05-08 MED ORDER — IOHEXOL 350 MG/ML SOLN
75.0000 mL | Freq: Once | INTRAVENOUS | Status: AC | PRN
  Administered 2024-05-08: 75 mL via INTRAVENOUS

## 2024-05-08 MED ORDER — THIAMINE HCL 100 MG/ML IJ SOLN
100.0000 mg | Freq: Every day | INTRAMUSCULAR | Status: DC
  Filled 2024-05-08 (×2): qty 2

## 2024-05-08 MED ORDER — LORAZEPAM 2 MG/ML IJ SOLN: 0.0000 mg | Freq: Two times a day (BID) | INTRAMUSCULAR | Status: DC

## 2024-05-08 MED ORDER — ADULT MULTIVITAMIN W/MINERALS CH
1.0000 | ORAL_TABLET | Freq: Every day | ORAL | Status: DC
  Administered 2024-05-09: 1 via ORAL
  Filled 2024-05-08: qty 1

## 2024-05-08 MED ORDER — LACTATED RINGERS IV SOLN: INTRAVENOUS | Status: DC

## 2024-05-08 MED ORDER — FENTANYL CITRATE PF 50 MCG/ML IJ SOSY
50.0000 ug | PREFILLED_SYRINGE | Freq: Once | INTRAMUSCULAR | Status: AC
  Administered 2024-05-08: 50 ug via INTRAVENOUS
  Filled 2024-05-08: qty 1

## 2024-05-08 MED ORDER — MORPHINE SULFATE (PF) 4 MG/ML IV SOLN: 4.0000 mg | Freq: Once | INTRAVENOUS | Status: DC

## 2024-05-08 MED ORDER — FOLIC ACID 1 MG PO TABS
1.0000 mg | ORAL_TABLET | Freq: Every day | ORAL | Status: DC
  Administered 2024-05-09: 1 mg via ORAL
  Filled 2024-05-08: qty 1

## 2024-05-08 MED ORDER — ACETAMINOPHEN 650 MG RE SUPP: 650.0000 mg | Freq: Four times a day (QID) | RECTAL | Status: DC | PRN

## 2024-05-08 MED ORDER — NALOXONE HCL 0.4 MG/ML IJ SOLN: 0.4000 mg | INTRAMUSCULAR | Status: DC | PRN

## 2024-05-08 MED ORDER — LORAZEPAM 1 MG PO TABS: 0.0000 mg | ORAL_TABLET | Freq: Four times a day (QID) | ORAL | Status: DC

## 2024-05-08 MED ORDER — MELATONIN 3 MG PO TABS: 3.0000 mg | ORAL_TABLET | Freq: Every evening | ORAL | Status: DC | PRN

## 2024-05-08 MED ORDER — THIAMINE MONONITRATE 100 MG PO TABS
100.0000 mg | ORAL_TABLET | Freq: Every day | ORAL | Status: DC
  Administered 2024-05-08 – 2024-05-09 (×2): 100 mg via ORAL
  Filled 2024-05-08 (×2): qty 1

## 2024-05-08 MED ORDER — HYDROMORPHONE HCL 1 MG/ML IJ SOLN
0.5000 mg | INTRAMUSCULAR | Status: DC | PRN
  Administered 2024-05-08 – 2024-05-09 (×4): 0.5 mg via INTRAVENOUS
  Filled 2024-05-08 (×4): qty 0.5

## 2024-05-08 MED ORDER — ONDANSETRON HCL 4 MG/2ML IJ SOLN
4.0000 mg | Freq: Four times a day (QID) | INTRAMUSCULAR | Status: DC | PRN
  Filled 2024-05-08: qty 2

## 2024-05-08 NOTE — ED Provider Notes (Signed)
 [place] EMERGENCY DEPARTMENT AT [facility] Provider Note   CSN: [id number] Arrival date & time: 05/08/24  9097     Patient presents with: Abdominal Pain   Gary [name] is a 38 y.o. male with a history of alcohol  dependence, HIV, polysubstance abuse, and alcohol  pancreatitis who presents the ED today for abdominal pain.  Patient reports he has been having upper abdominal pain, which he associates with his chronic pancreatitis.  States that he drank wine this morning to prevent seizures.  Patient denies associated vomiting, dysuria, changes to bowel habits.  Denies any drug use.  He appears quite intoxicated on initial evaluation.    Prior to Admission medications   Medication Sig Start Date End Date Taking? Authorizing Provider  bictegravir-emtricitabine -tenofovir  AF (BIKTARVY ) 50-200-25 MG TABS tablet Take 1 tablet by mouth daily. 02/24/24   Von Bellis, MD  feeding supplement (ENSURE PLUS HIGH PROTEIN) LIQD Take 237 mLs by mouth 2 (two) times daily between meals. 04/25/24   Bennett, Christal H, NP  folic acid  (FOLVITE ) 1 MG tablet Take 1 tablet (1 mg total) by mouth daily. 03/06/24   Dorinda Drue DASEN, MD  gabapentin  (NEURONTIN ) 100 MG capsule Take 2 capsules (200 mg total) by mouth 3 (three) times daily. 04/25/24   Bennett, Christal H, NP  Multiple Vitamin (MULTIVITAMIN WITH MINERALS) TABS tablet Take 1 tablet by mouth daily. 04/26/24   Bennett, Christal H, NP  nicotine  (NICODERM CQ  - DOSED IN MG/24 HOURS) 14 mg/24hr patch Place 1 patch (14 mg total) onto the skin daily. 04/26/24   Bennett, Christal H, NP  Pancrelipase , Lip-Prot-Amyl, 24000-76000 units CPEP Take 1 capsule (24,000 Units total) by mouth 3 (three) times daily before meals. 05/07/24   Logan Ubaldo NOVAK, PA-C  pantoprazole  (PROTONIX ) 40 MG tablet Take 1 tablet (40 mg total) by mouth 2 (two) times daily. Patient taking differently: Take 40 mg by mouth 2 (two) times daily as needed (Acid reflux). 03/05/24   Dorinda Drue DASEN, MD  QUEtiapine  (SEROQUEL ) 50 MG tablet Take 1 tablet (50 mg total) by mouth at bedtime. 04/25/24   Bennett, Christal H, NP  risperiDONE  (RISPERDAL  M-TABS) 2 MG disintegrating tablet Take 1 tablet (2 mg total) by mouth 2 (two) times daily. 04/25/24   Bennett, Christal H, NP  thiamine  (VITAMIN B-1) 100 MG tablet Take 1 tablet (100 mg total) by mouth daily. 03/06/24   Dorinda Drue DASEN, MD  famotidine  (PEPCID ) 20 MG tablet Take 1 tablet (20 mg total) by mouth 2 (two) times daily. Patient not taking: Reported on 06/01/2019 05/14/19 06/02/19  [name], MD    Allergies: Tegretol  [carbamazepine ] and Caffeine    Review of Systems  Gastrointestinal:  Positive for abdominal pain.  All other systems reviewed and are negative.   Updated Vital Signs BP 107/62   Pulse 73   Temp 97.8 F (36.6 C) (Oral)   Resp 16   SpO2 99%   Physical Exam Vitals and nursing note reviewed.  Constitutional:      Comments: Patient is intoxicated.  HENT:     Head: Normocephalic and atraumatic.     Mouth/Throat:     Mouth: Mucous membranes are moist.   Eyes:     Conjunctiva/sclera: Conjunctivae normal.     Pupils: Pupils are equal, round, and reactive to light.    Cardiovascular:     Rate and Rhythm: Normal rate and regular rhythm.     Pulses: Normal pulses.     Heart sounds: Normal heart sounds.  Pulmonary:     Effort: Pulmonary effort is normal.     Breath sounds: Normal breath sounds.  Abdominal:     Palpations: Abdomen is soft.     Tenderness: There is abdominal tenderness.     Comments: TTP of epigastrium   Skin:    General: Skin is warm and dry.     Findings: No rash.   Neurological:     General: No focal deficit present.   Psychiatric:        Mood and Affect: Mood normal.        Behavior: Behavior normal.    (all labs ordered are listed, but only abnormal results are displayed) Labs Reviewed  COMPREHENSIVE METABOLIC PANEL WITH GFR - Abnormal; Notable for the following components:       Result Value   Glucose, Bld 158 (*)    Creatinine, Ser 0.32 (*)    Calcium  8.4 (*)    All other components within normal limits  CBC WITH DIFFERENTIAL/PLATELET - Abnormal; Notable for the following components:   WBC 3.2 (*)    RBC 3.59 (*)    Hemoglobin 10.5 (*)    HCT 32.7 (*)    RDW 18.6 (*)    Platelets 143 (*)    All other components within normal limits  ETHANOL - Abnormal; Notable for the following components:   Alcohol , Ethyl (B) 411 (*)    All other components within normal limits  LIPASE, BLOOD    EKG: None  Radiology: CT ABDOMEN PELVIS W CONTRAST Result Date: 05/08/2024 CLINICAL DATA:  Abdominal pain.  Pancreatitis. EXAM: CT ABDOMEN AND PELVIS WITH CONTRAST TECHNIQUE: Multidetector CT imaging of the abdomen and pelvis was performed using the standard protocol following bolus administration of intravenous contrast. RADIATION DOSE REDUCTION: This exam was performed according to the departmental dose-optimization program which includes automated exposure control, adjustment of the mA and/or kV according to patient size and/or use of iterative reconstruction technique. CONTRAST:  OMNIPAQUE  IOHEXOL  300 MG/ML  SOLN COMPARISON:  CT abdomen pelvis dated 03/01/2024. FINDINGS: Evaluation of this exam is limited due to respiratory motion. Lower chest: The visualized lung bases are clear. No intra-abdominal free air or free fluid. Hepatobiliary: Fatty liver. No biliary dilatation. The gallbladder is unremarkable. Pancreas: Ill-defined area of hypodensity in the head of the pancreas in keeping with provided history of pancreatitis. Evaluation is limited due to respiratory motion. The previously seen bilobed hemorrhagic pseudocysts are not seen on today's exam. There is a 1.8 x 2.0 cm ill-defined rounded structure posterior to the head of the pancreas and at the porta splenic confluence (24/2). This structure demonstrates partial enhancement and may represent a pseudoaneurysm.  Evaluation however is limited due to respiratory motion and poor visualization. Spleen: Normal in size without focal abnormality. Adrenals/Urinary Tract: The adrenal glands unremarkable. The kidneys, visualized ureters, and urinary bladder appear unremarkable Stomach/Bowel: There is no bowel obstruction or active inflammation. The appendix is normal. Vascular/Lymphatic: The abdominal aorta and IVC unremarkable. No portal venous gas. There is no adenopathy Reproductive: The prostate and seminal vesicles are grossly remarkable. Other: None Musculoskeletal: Osteopenia with degenerative changes. No acute osseous pathology. IMPRESSION: 1. Ill-defined area of hypodensity in the head of the pancreas in keeping with provided history of pancreatitis. The previously seen bilobed hemorrhagic pseudocysts are not seen on today's exam. 2. A 1.8 x 2.0 cm ill-defined rounded structure posterior to the head of the pancreas and at the porta splenic confluence may represent a pseudoaneurysm. Further evaluation with CTA  is recommended. 3. Fatty liver. 4. No bowel obstruction. Normal appendix. Electronically Signed   By: Vanetta Chou M.D.   On: 05/08/2024 13:45     Procedures   Medications Ordered in the ED  morphine  (PF) 4 MG/ML injection 4 mg (has no administration in time range)  iohexol  (OMNIPAQUE ) 300 MG/ML solution 100 mL (100 mLs Intravenous Contrast Given 05/08/24 1313)                                    Medical Decision Making Amount and/or Complexity of Data Reviewed Labs: ordered. Radiology: ordered.  Risk Prescription drug management.   This patient presents to the ED for concern of abdominal pain, this involves an extensive number of treatment options, and is a complaint that carries with it a high risk of complications and morbidity.   Differential diagnosis includes: gastritis, gastroenteritis, IBS, IBD, pancreatitis flare, cholecystitis, cholangitis, acute alcohol  intoxication, malingering,  etc.   Comorbidities  See HPI above   Additional History  Additional history obtained from prior ED records   Lab Tests  I ordered and personally interpreted labs.  The pertinent results include:   CMP and lipase are reassuring CBC within normal limits for patient Ethanol level of 411   Imaging Studies  I ordered imaging studies including CT abdomen/pelvis, CTA abdomen/pelvis I independently visualized and interpreted imaging which showed:  Hypodensity at the prior history of pancreatitis.  Pseudocyst previously noted are not seen today. 1.8 x 2.0 cm ill-defined rounded structure posterior to the head of the pancreas and portal splenic confluence, may represent pseudoaneurysm.  Further evaluation with CTA is recommended.  Fatty liver.  No bowel obstruction.  Normal appendix. CTA pending at shift change. I agree with the radiologist interpretation   Problem List / ED Course / Critical Interventions / Medication Management  Patient presents to the ED today for abdominal pain and alcohol  intoxication. Reports drinking a bottle of wine this morning to help with his pancreatitis and to prevent withdrawal seizures. Patient appears intoxicated on initial evaluation. Guarding present with light touch to the abdomen.  I ordered medications including: Morphine  for pain  I have reviewed the patients home medicines and have made adjustments as needed   Social Determinants of Health  Substance abuse   Test / Admission - Considered  Patient care signed out to oncoming provider at shift change.  Disposition pending results and  assessment after metabolizing alcohol .    Final diagnoses:  Alcoholic intoxication without complication [facility])    ED Discharge Orders     None          Waddell Sluder, PA-C 05/08/24 1535    Lenor Hollering, MD 05/15/24 [phone]

## 2024-05-08 NOTE — ED Provider Notes (Signed)
  Physical Exam  BP 107/62   Pulse 73   Temp 97.8 F (36.6 C) (Oral)   Resp 16   SpO2 99%   Physical Exam  Procedures  Procedures  ED Course / MDM   Clinical Course as of 05/09/24 1234  Mon May 08, 2024  1737 Patient reevaluated. He states that he is in pain and feels like his pancreatitis is flaring, Which is why he come to the ED.  Very TTP in the LUQ [AH]  1822 Case discussed with Dr. Burnette. 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  He states that given the patient's chronic alcoholism and current social situation he has no current recommendations other than to stop drinking this does present the potential for life-threatening bleed at some point if he does not stop drinking.  [AH]    Clinical Course User Index [AH] Arloa Chroman, PA-C   Medical Decision Making Amount and/or Complexity of Data Reviewed Labs: ordered. Radiology: ordered.  Risk OTC drugs. Prescription drug management. Decision regarding hospitalization.  3:11 PM Patient handoff from PA [facility], Well-known ED patient here with ETOH intoxication > 400 Waiting CTA abd/pelvis for ? Pseudoaneurysm at the Porta-splenic confluence Unlikely this is the cause of his abdominal pain. Await results   Patient with acute on chronic pancretitis,  Withdrawal, abdominal pain- and thrombosed vessel of the abdomen. Would benefit from fluid resuscitaition, pain control and withdrawal treatment    Arloa Chroman, PA-C 05/09/24 1236    Ula Prentice SAUNDERS, MD 05/09/24 2302

## 2024-05-08 NOTE — H&P (Signed)
 History and Physical      Gary [name] FMW:[id number] DOB: [birth year] DOA: 05/08/2024; DOS: 05/08/2024  PCP: Pcp, No *** Patient coming from: home ***  I have personally briefly reviewed patient's old medical records in [facility]  Chief Complaint: ***  HPI: Gary [name] is a 38 y.o. male with medical history significant for *** who is admitted to [facility] on 05/08/2024 with *** after presenting from home*** to [facility] ED complaining of ***.   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 Burnette, who conveyed that there is no need for anticoagulation, surveillance imaging, or additional intervention aside from encouraging the patient to stop consuming alcohol . [facility] is available, prn, if additional consultation is needed.  While in the ED, the following were administered: ***  Subsequently, the patient was admitted  ***  ***red   Review of Systems: As per HPI otherwise 10 point review of systems negative.   Past Medical History:  Diagnosis Date   Alcohol  abuse    Alcohol -induced pancreatitis 04/16/2022   Anxiety    Bipolar 2 disorder (HCC)    HIV (human immunodeficiency virus infection) (HCC)    Pancreatitis    PTSD (post-traumatic stress disorder)    Schizophrenia (HCC)    Seizures (HCC)    Subdural hematoma (HCC)     Past Surgical History:  Procedure Laterality Date   BIOPSY  04/19/2022   Procedure: BIOPSY;  Surgeon: Wilhelmenia Aloha Raddle., MD;  Location: [facility] ENDOSCOPY;  Service: Gastroenterology;;    ENTEROSCOPY N/A 04/19/2022   Procedure: ENTEROSCOPY;  Surgeon: Wilhelmenia Aloha Raddle., MD;  Location: [facility] ENDOSCOPY;  Service: Gastroenterology;  Laterality: N/A;   INCISION AND DRAINAGE PERIRECTAL ABSCESS N/A 09/24/2016   Procedure: IRRIGATION AND DEBRIDEMENT PERIRECTAL ABSCESS;  Surgeon: Carlin Pastel, MD;  Location: [facility] ORS;  Service: General;  Laterality: N/A;   none      Social History:  reports that he has been smoking cigarettes. He started smoking about 21 years ago. He has a 10.7 pack-year smoking history. He has never used smokeless tobacco. He reports current alcohol  use of about 105.0 standard drinks of alcohol  per week. He reports that he does not currently use drugs after having used the following drugs: Methamphetamines and Cocaine.   Allergies  Allergen Reactions   Tegretol  [Carbamazepine ] Other (See Comments)    Vertigo  Paralysis   Caffeine Palpitations    Family History  Problem Relation Age of Onset   Hypertension Mother    Alcohol  abuse Mother    Alcoholism Mother    Alcohol  abuse Father    Colon cancer Other    Other Other    Cancer Other     Family history reviewed and not pertinent ***   Prior to Admission medications   Medication Sig Start Date End Date Taking? Authorizing Provider  bictegravir-emtricitabine -tenofovir  AF (BIKTARVY ) 50-200-25 MG TABS tablet Take 1 tablet by mouth daily. 02/24/24   Von Bellis, MD  feeding supplement (ENSURE PLUS HIGH PROTEIN) LIQD Take 237 mLs by mouth 2 (two) times daily between meals.  04/25/24   Bennett, Christal H, NP  folic acid  (FOLVITE ) 1 MG tablet Take 1 tablet (1 mg total) by mouth daily. 03/06/24   Dorinda Drue DASEN, MD  gabapentin  (NEURONTIN ) 100 MG capsule Take 2 capsules (200 mg total) by mouth 3 (three) times daily. 04/25/24   Bennett, Christal H, NP  Multiple Vitamin (MULTIVITAMIN WITH MINERALS) TABS tablet Take 1 tablet by mouth daily. 04/26/24   Bennett, Christal H, NP  nicotine  (NICODERM CQ  - DOSED IN MG/24  HOURS) 14 mg/24hr patch Place 1 patch (14 mg total) onto the skin daily. 04/26/24   Bennett, Christal H, NP  Pancrelipase , Lip-Prot-Amyl, 24000-76000 units CPEP Take 1 capsule (24,000 Units total) by mouth 3 (three) times daily before meals. 05/07/24   Logan Ubaldo NOVAK, PA-C  pantoprazole  (PROTONIX ) 40 MG tablet Take 1 tablet (40 mg total) by mouth 2 (two) times daily. Patient taking differently: Take 40 mg by mouth 2 (two) times daily as needed (Acid reflux). 03/05/24   Dorinda Drue DASEN, MD  QUEtiapine  (SEROQUEL ) 50 MG tablet Take 1 tablet (50 mg total) by mouth at bedtime. 04/25/24   Bennett, Christal H, NP  risperiDONE  (RISPERDAL  M-TABS) 2 MG disintegrating tablet Take 1 tablet (2 mg total) by mouth 2 (two) times daily. 04/25/24   Bennett, Christal H, NP  thiamine  (VITAMIN B-1) 100 MG tablet Take 1 tablet (100 mg total) by mouth daily. 03/06/24   Dorinda Drue DASEN, MD  famotidine  (PEPCID ) 20 MG tablet Take 1 tablet (20 mg total) by mouth 2 (two) times daily. Patient not taking: Reported on 06/01/2019 05/14/19 06/02/19  [name], MD     Objective    Physical Exam: Vitals:   05/08/24 1256 05/08/24 1709 05/08/24 1831 05/08/24 1939  BP: 107/62 (!) 90/59 102/71 102/71  Pulse: 73 88 66 66  Resp: 16 16 18    Temp: 97.8 F (36.6 C) 98.1 F (36.7 C) 98.6 F (37 C)   TempSrc: Oral     SpO2: 99% 100% 99%     General: appears to be stated age; alert, oriented Skin: warm, dry, no rash Head:  AT/[place] Mouth:  Oral mucosa membranes appear moist, normal dentition Neck: supple; trachea midline Heart:  RRR; did not appreciate any M/R/G Lungs: CTAB, did not appreciate any wheezes, rales, or rhonchi Abdomen: + BS; soft, ND, NT Vascular: 2+ pedal pulses b/l; 2+ radial pulses b/l Extremities: no peripheral edema, no muscle wasting Neuro: strength and sensation intact in upper and lower extremities b/l    *** Neuro: 5/5 strength of the proximal and distal flexors and extensors of the upper and lower  extremities bilaterally; sensation intact in upper and lower extremities b/l; cranial nerves II through XII grossly intact; no pronator drift; no evidence suggestive of slurred speech, dysarthria, or facial droop; Normal muscle tone. No tremors. *** Neuro: In the setting of the patient's current mental status and associated inability to follow instructions, unable to perform full neurologic exam at this time.  As such, assessment of strength, sensation, and cranial nerves is limited at this time. Patient noted to spontaneously move all 4 extremities. No tremors.  ***    Labs on Admission: I have personally reviewed following labs and imaging studies  CBC: Recent Labs  Lab 05/02/24 1150 05/06/24 2114 05/08/24 1032  WBC 3.8* 3.4* 3.2*  NEUTROABS 1.7  --  1.7  HGB 9.8* 10.2* 10.5*  HCT 30.3* 31.9* 32.7*  MCV 93.5 91.4 91.1  PLT 349 236 143*   Basic Metabolic Panel:  Recent Labs  Lab 05/02/24 1150 05/06/24 2114 05/08/24 1032  NA 142 140 139  K 3.4* 4.0 3.8  CL 106 107 105  CO2 24 22 22   GLUCOSE 90 114* 158*  BUN 13 9 10   CREATININE 0.55* 0.57* 0.32*  CALCIUM  7.9* 8.5* 8.4*   GFR: Estimated Creatinine Clearance: 111 mL/min (A) (by C-G formula based on SCr of 0.32 mg/dL (L)). Liver Function Tests: Recent Labs  Lab 05/02/24 1150 05/06/24 2114 05/08/24 1032  AST 25 47* 40  ALT 19 33 34  ALKPHOS 55 50 57  BILITOT 0.5 0.3 0.5  PROT 6.8 6.7 6.9  ALBUMIN 3.6 3.7 3.9   Recent Labs  Lab 05/06/24 2114 05/08/24 1032  LIPASE 46 39   No results for input(s): AMMONIA in the last 168 hours. Coagulation Profile: No results for input(s): INR, PROTIME in the last 168 hours. Cardiac Enzymes: Recent Labs  Lab 05/02/24 1150  CKTOTAL 135   BNP (last 3 results) No results for input(s): PROBNP in the last 8760 hours. HbA1C: No results for input(s): HGBA1C in the last 72 hours. CBG: No results for input(s): GLUCAP in the last 168 hours. Lipid Profile: No results  for input(s): CHOL, HDL, LDLCALC, TRIG, CHOLHDL, LDLDIRECT in the last 72 hours. Thyroid  Function Tests: No results for input(s): TSH, T4TOTAL, FREET4, T3FREE, THYROIDAB in the last 72 hours. Anemia Panel: No results for input(s): VITAMINB12, FOLATE, FERRITIN, TIBC, IRON, RETICCTPCT in the last 72 hours. Urine analysis:    Component Value Date/Time   COLORURINE COLORLESS (A) 03/01/2024 1602   APPEARANCEUR CLEAR (A) 03/01/2024 1602   APPEARANCEUR Clear 11/15/2014 1755   LABSPEC 1.004 (L) 03/01/2024 1602   LABSPEC 1.002 11/15/2014 1755   PHURINE 6.0 03/01/2024 1602   GLUCOSEU NEGATIVE 03/01/2024 1602   GLUCOSEU Negative 11/15/2014 1755   HGBUR NEGATIVE 03/01/2024 1602   BILIRUBINUR NEGATIVE 03/01/2024 1602   BILIRUBINUR Negative 11/15/2014 1755   KETONESUR NEGATIVE 03/01/2024 1602   PROTEINUR NEGATIVE 03/01/2024 1602   NITRITE NEGATIVE 03/01/2024 1602   LEUKOCYTESUR NEGATIVE 03/01/2024 1602   LEUKOCYTESUR Negative 11/15/2014 1755    Radiological Exams on Admission: CT Angio Abd/Pel W and/or Wo Contrast Result Date: 05/08/2024 CLINICAL DATA:  Pancreatitis, concern for pseudoaneurysm EXAM: CTA ABDOMEN AND PELVIS WITHOUT AND WITH CONTRAST TECHNIQUE: Multidetector CT imaging of the abdomen and pelvis was performed using the standard protocol during bolus administration of intravenous contrast. Multiplanar reconstructed images and MIPs were obtained and reviewed to evaluate the vascular anatomy. RADIATION DOSE REDUCTION: This exam was performed according to the departmental dose-optimization program which includes automated exposure control, adjustment of the mA and/or kV according to patient size and/or use of iterative reconstruction technique. CONTRAST:  75mL OMNIPAQUE  IOHEXOL  350 MG/ML SOLN COMPARISON:  Same-day CT abdomen pelvis, 05/08/2024 FINDINGS: VASCULAR Angiographic examination is limited by significantly late (portal venous) phase of contrast  submitted for review as well as contamination from previously administered contrast and finally streak artifact from patient arm positioning. Within this limitation, there is an enhancing ovoid lesion centered in the posterior pancreatic head, underlying the portal confluence and measuring 1.6 x 1.4 cm (series 18, image 21). This appears to reflect a partially thrombosed pseudoaneurysm, vessel of origin not confidently identified given the technical limitations of the exam but given location most likely arising from posterior pancreaticoduodenal branches. Normal contour and caliber of the abdominal aorta. No evidence of aneurysm, dissection, or other acute aortic pathology. Standard branching pattern of the abdominal aorta with solitary bilateral renal arteries.  Focal effacement of the most central portion of the portal vein at its confluence (series 18, image 20). Early cavernous transformation. Small varices throughout the left upper quadrant (series 18, image 14). Review of the MIP images confirms the above findings. NON-VASCULAR Lower Chest: No acute findings.  Coronary artery calcifications. Hepatobiliary: No solid liver abnormality is seen. Hepatic steatosis. No gallstones, gallbladder wall thickening, or biliary dilatation. Pancreas: Expansile appearance of the pancreatic head with adjacent fat stranding, in keeping with acute on chronic pancreatitis. No pancreatic ductal dilatation. Spleen: Normal in size without significant abnormality. Adrenals/Urinary Tract: Adrenal glands are unremarkable. Kidneys are normal, without renal calculi, solid lesion, or hydronephrosis. Bladder is unremarkable. Stomach/Bowel: Stomach is within normal limits. Appendix appears normal. No evidence of bowel wall thickening, distention, or inflammatory changes. Lymphatic: No enlarged abdominal or pelvic lymph nodes. Reproductive: No mass or other significant abnormality. Other: No abdominal wall hernia or abnormality. No ascites.  Musculoskeletal: No acute osseous findings. IMPRESSION: 1. Angiographic examination technically limited as detailed above. Within these limitations, there is an enhancing ovoid lesion centered in the posterior pancreatic head, underlying the portal confluence and measuring 1.6 x 1.4 cm. Tthis appears to reflect a partially thrombosed pseudoaneurysm, vessel of origin not confidently identified given the technical limitations of the exam but given location most likely arising from posterior pancreaticoduodenal branches. 2. Focal effacement of the most central portion of the portal vein at its confluence. Early cavernous transformation. Small varices throughout the left upper quadrant. 3. Expansile appearance of the pancreatic head with adjacent fat stranding, in keeping with acute on chronic pancreatitis. 4. Hepatic steatosis. 5. Coronary artery disease advanced for patient age. Electronically Signed   By: Marolyn JONETTA Jaksch M.D.   On: 05/08/2024 16:23   CT ABDOMEN PELVIS W CONTRAST Result Date: 05/08/2024 CLINICAL DATA:  Abdominal pain.  Pancreatitis. EXAM: CT ABDOMEN AND PELVIS WITH CONTRAST TECHNIQUE: Multidetector CT imaging of the abdomen and pelvis was performed using the standard protocol following bolus administration of intravenous contrast. RADIATION DOSE REDUCTION: This exam was performed according to the departmental dose-optimization program which includes automated exposure control, adjustment of the mA and/or kV according to patient size and/or use of iterative reconstruction technique. CONTRAST:  OMNIPAQUE  IOHEXOL  300 MG/ML  SOLN COMPARISON:  CT abdomen pelvis dated 03/01/2024. FINDINGS: Evaluation of this exam is limited due to respiratory motion. Lower chest: The visualized lung bases are clear. No intra-abdominal free air or free fluid. Hepatobiliary: Fatty liver. No biliary dilatation. The gallbladder is unremarkable. Pancreas: Ill-defined area of hypodensity in the head of the pancreas in  keeping with provided history of pancreatitis. Evaluation is limited due to respiratory motion. The previously seen bilobed hemorrhagic pseudocysts are not seen on today's exam. There is a 1.8 x 2.0 cm ill-defined rounded structure posterior to the head of the pancreas and at the porta splenic confluence (24/2). This structure demonstrates partial enhancement and may represent a pseudoaneurysm. Evaluation however is limited due to respiratory motion and poor visualization. Spleen: Normal in size without focal abnormality. Adrenals/Urinary Tract: The adrenal glands unremarkable. The kidneys, visualized ureters, and urinary bladder appear unremarkable Stomach/Bowel: There is no bowel obstruction or active inflammation. The appendix is normal. Vascular/Lymphatic: The abdominal aorta and IVC unremarkable. No portal venous gas. There is no adenopathy Reproductive: The prostate and seminal vesicles are grossly remarkable. Other: None Musculoskeletal: Osteopenia with degenerative changes. No acute osseous pathology. IMPRESSION: 1. Ill-defined area of hypodensity in the head of the pancreas in keeping with provided history of  pancreatitis. The previously seen bilobed hemorrhagic pseudocysts are not seen on today's exam. 2. A 1.8 x 2.0 cm ill-defined rounded structure posterior to the head of the pancreas and at the porta splenic confluence may represent a pseudoaneurysm. Further evaluation with CTA is recommended. 3. Fatty liver. 4. No bowel obstruction. Normal appendix. Electronically Signed   By: Vanetta Chou M.D.   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 Burnette, who conveyed that there is no need for anticoagulation, surveillance imaging, or additional intervention aside from encouraging the patient to stop consuming alcohol . [facility] is available, prn, if additional consultation is needed.;  Admission status: ***    I SPENT GREATER THAN 75 *** MINUTES IN CLINICAL CARE TIME/MEDICAL DECISION-MAKING IN COMPLETING THIS ADMISSION.     Eva NOVAK Efe Fazzino DO [facility] From 7PM - 7AM   05/08/2024, 8:27 PM   ***

## 2024-05-08 NOTE — ED Notes (Signed)
 Pt sleeping most of the time since he has been here.  Pt will wake up and speak mostly incoherent and then go back to sleep.  He has ask for food and something to drink but we continue to let him know he can not have that at this time.

## 2024-05-08 NOTE — ED Notes (Signed)
Called to give report with no answer.

## 2024-05-08 NOTE — ED Triage Notes (Signed)
 Pt brought by pd. Pt states his pancreatitic is acting up. Drank wine this morning, 'just enough not to have seizures'. No drugs, no urinary sx.

## 2024-05-09 ENCOUNTER — Encounter (HOSPITAL_COMMUNITY): Payer: Self-pay | Admitting: Internal Medicine

## 2024-05-09 DIAGNOSIS — R6510 Systemic inflammatory response syndrome (SIRS) of non-infectious origin without acute organ dysfunction: Secondary | ICD-10-CM | POA: Diagnosis present

## 2024-05-09 DIAGNOSIS — F3181 Bipolar II disorder: Secondary | ICD-10-CM | POA: Diagnosis present

## 2024-05-09 DIAGNOSIS — F319 Bipolar disorder, unspecified: Secondary | ICD-10-CM | POA: Diagnosis present

## 2024-05-09 DIAGNOSIS — R1013 Epigastric pain: Secondary | ICD-10-CM | POA: Diagnosis present

## 2024-05-09 DIAGNOSIS — K8500 Idiopathic acute pancreatitis without necrosis or infection: Secondary | ICD-10-CM

## 2024-05-09 DIAGNOSIS — I728 Aneurysm of other specified arteries: Secondary | ICD-10-CM

## 2024-05-09 LAB — MAGNESIUM: Magnesium: 1.6 mg/dL — ABNORMAL LOW (ref 1.7–2.4)

## 2024-05-09 LAB — CBC WITH DIFFERENTIAL/PLATELET
Abs Immature Granulocytes: 0.01 10*3/uL (ref 0.00–0.07)
Basophils Absolute: 0 10*3/uL (ref 0.0–0.1)
Basophils Relative: 0 %
Eosinophils Absolute: 0.1 10*3/uL (ref 0.0–0.5)
Eosinophils Relative: 4 %
HCT: 30.4 % — ABNORMAL LOW (ref 39.0–52.0)
Hemoglobin: 9.7 g/dL — ABNORMAL LOW (ref 13.0–17.0)
Immature Granulocytes: 0 %
Lymphocytes Relative: 41 %
Lymphs Abs: 1 10*3/uL (ref 0.7–4.0)
MCH: 29.2 pg (ref 26.0–34.0)
MCHC: 31.9 g/dL (ref 30.0–36.0)
MCV: 91.6 fL (ref 80.0–100.0)
Monocytes Absolute: 0.1 10*3/uL (ref 0.1–1.0)
Monocytes Relative: 6 %
Neutro Abs: 1.2 10*3/uL — ABNORMAL LOW (ref 1.7–7.7)
Neutrophils Relative %: 49 %
Platelets: 105 10*3/uL — ABNORMAL LOW (ref 150–400)
RBC: 3.32 MIL/uL — ABNORMAL LOW (ref 4.22–5.81)
RDW: 18.4 % — ABNORMAL HIGH (ref 11.5–15.5)
WBC: 2.5 10*3/uL — ABNORMAL LOW (ref 4.0–10.5)
nRBC: 0 % (ref 0.0–0.2)

## 2024-05-09 LAB — COMPREHENSIVE METABOLIC PANEL WITH GFR
ALT: 31 U/L (ref 0–44)
AST: 36 U/L (ref 15–41)
Albumin: 3.5 g/dL (ref 3.5–5.0)
Alkaline Phosphatase: 54 U/L (ref 38–126)
Anion gap: 11 (ref 5–15)
BUN: 6 mg/dL (ref 6–20)
CO2: 23 mmol/L (ref 22–32)
Calcium: 8 mg/dL — ABNORMAL LOW (ref 8.9–10.3)
Chloride: 98 mmol/L (ref 98–111)
Creatinine, Ser: 0.42 mg/dL — ABNORMAL LOW (ref 0.61–1.24)
GFR, Estimated: >60 mL/min (ref >60)
Glucose, Bld: 80 mg/dL (ref 70–99)
Potassium: 3.3 mmol/L — ABNORMAL LOW (ref 3.5–5.1)
Sodium: 132 mmol/L — ABNORMAL LOW (ref 135–145)
Total Bilirubin: 0.9 mg/dL (ref 0.0–1.2)
Total Protein: 6.3 g/dL — ABNORMAL LOW (ref 6.5–8.1)

## 2024-05-09 LAB — TYPE AND SCREEN
ABO/RH(D): A POS
Antibody Screen: NEGATIVE

## 2024-05-09 LAB — PHOSPHORUS: Phosphorus: 3.6 mg/dL (ref 2.5–4.6)

## 2024-05-09 MED ORDER — HYDROMORPHONE HCL 1 MG/ML IJ SOLN
0.5000 mg | Freq: Four times a day (QID) | INTRAMUSCULAR | Status: DC | PRN
  Administered 2024-05-09: 0.5 mg via INTRAVENOUS
  Filled 2024-05-09: qty 0.5

## 2024-05-09 MED ORDER — CHLORDIAZEPOXIDE HCL 25 MG PO CAPS: 25.0000 mg | ORAL_CAPSULE | Freq: Two times a day (BID) | ORAL | Status: DC

## 2024-05-09 MED ORDER — CHLORDIAZEPOXIDE HCL 25 MG PO CAPS: 25.0000 mg | ORAL_CAPSULE | Freq: Every day | ORAL | Status: DC

## 2024-05-09 MED ORDER — HYDROMORPHONE HCL 1 MG/ML IJ SOLN: 0.5000 mg | Freq: Once | INTRAMUSCULAR | Status: DC

## 2024-05-09 MED ORDER — RISPERIDONE 1 MG PO TBDP
2.0000 mg | ORAL_TABLET | Freq: Two times a day (BID) | ORAL | Status: DC
  Administered 2024-05-09: 2 mg via ORAL
  Filled 2024-05-09 (×2): qty 2

## 2024-05-09 MED ORDER — BICTEGRAVIR-EMTRICITAB-TENOFOV 50-200-25 MG PO TABS
1.0000 | ORAL_TABLET | Freq: Every day | ORAL | Status: DC
  Administered 2024-05-09: 1 via ORAL
  Filled 2024-05-09: qty 1

## 2024-05-09 MED ORDER — LORAZEPAM 2 MG/ML IJ SOLN
1.0000 mg | INTRAMUSCULAR | Status: DC | PRN
  Administered 2024-05-09 (×3): 2 mg via INTRAVENOUS
  Filled 2024-05-09 (×3): qty 1

## 2024-05-09 MED ORDER — QUETIAPINE FUMARATE 50 MG PO TABS: 50.0000 mg | ORAL_TABLET | Freq: Every day | ORAL | Status: DC

## 2024-05-09 MED ORDER — LORAZEPAM 1 MG PO TABS
1.0000 mg | ORAL_TABLET | ORAL | Status: DC | PRN
  Administered 2024-05-09: 2 mg via ORAL
  Filled 2024-05-09: qty 2

## 2024-05-09 MED ORDER — CHLORDIAZEPOXIDE HCL 25 MG PO CAPS
25.0000 mg | ORAL_CAPSULE | Freq: Three times a day (TID) | ORAL | Status: DC
  Administered 2024-05-09: 25 mg via ORAL
  Filled 2024-05-09: qty 1

## 2024-05-09 MED ORDER — GABAPENTIN 100 MG PO CAPS
200.0000 mg | ORAL_CAPSULE | Freq: Three times a day (TID) | ORAL | Status: DC
  Administered 2024-05-09: 200 mg via ORAL
  Filled 2024-05-09: qty 2

## 2024-05-09 NOTE — Hospital Course (Addendum)
 Brief Narrative:   38 y.o. male with medical history significant for chronic alcohol  use, chronic pancreatitis, HIV, bipolar, chronic leukopenia, anemia of chronic disease associated baseline hemoglobin 9-11, who is admitted to [facility] on 05/08/2024 with acute on chronic pancreatitis after presenting from home to [facility] ED complaining of abdominal pain. CT abdomen/pelvis with contrast showed findings associated with the head of the pancreas consistent with pancreatitis, while showing interval resolution of previously noted hemorrhagic pseudocyst. No radiographic evidence of pancreatic necrosis. Fatty liver noted. There is also a 1.8 x 2.0 cm ill-defined structure posterior to the head of the pancreas at the porta splenic confluence, suggestive of pseudoaneurysm.  EDP discussed case with [facility], Dr. Burnette who recommends no further intervention at this point.   ~33 ER visits just in the last 6 months and at least 7 ER visits in June 2025.  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      Subjective:  Still having abdominal pain Slightly tremulous when I walked in the room  Examination:  General exam: Appears calm and comfortable, cachectic frail, slightly tremulous Respiratory system: Clear to auscultation. Respiratory effort normal. Cardiovascular system: S1 & S2 heard, RRR. No JVD, murmurs, rubs, gallops or clicks. No pedal edema. Gastrointestinal system: Abdomen is tender to deep palpation.  Positive bowel sounds Central nervous system: Alert and oriented. No focal neurological deficits. Extremities: Symmetric 5 x 5 power. Skin: No rashes, lesions or ulcers Psychiatry: Judgement and insight appear normal. Mood & affect appropriate.

## 2024-05-09 NOTE — Progress Notes (Signed)
 PROGRESS NOTE    Gary [name]  FMW:[id number] DOB: 1986/09/10 DOA: 05/08/2024 PCP: Pcp, No    Brief Narrative:   38 y.o. male with medical history significant for chronic alcohol  use, chronic pancreatitis, HIV, bipolar, chronic leukopenia, anemia of chronic disease associated baseline hemoglobin 9-11, who is admitted to [facility] on 05/08/2024 with acute on chronic pancreatitis after presenting from home to [facility] ED complaining of abdominal pain. CT abdomen/pelvis with contrast showed findings associated with the head of the pancreas consistent with pancreatitis, while showing interval resolution of previously noted hemorrhagic pseudocyst. No radiographic evidence of pancreatic necrosis. Fatty liver noted. There is also a 1.8 x 2.0 cm ill-defined structure posterior to the head of the pancreas at the porta splenic confluence, suggestive of pseudoaneurysm.  EDP discussed case with [facility], Dr. Burnette who recommends no further intervention at this point.   ~33 ER visits just in the last 6 months and at least 7 ER visits in June 2025.  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     Subjective:  Still having abdominal pain Slightly tremulous when I walked in the room  Examination:  General exam: Appears calm and comfortable, cachectic frail, slightly tremulous Respiratory system: Clear to auscultation. Respiratory effort normal. Cardiovascular system: S1 & S2 heard, RRR. No JVD, murmurs, rubs, gallops or clicks. No pedal edema. Gastrointestinal system: Abdomen is tender to deep palpation.  Positive bowel sounds Central nervous system: Alert and oriented. No focal neurological deficits. Extremities: Symmetric 5 x 5 power. Skin: No rashes, lesions or ulcers Psychiatry: Judgement and insight appear normal. Mood & affect appropriate.                Diet Orders (From admission, onward)     Start     Ordered   05/08/24 2024  Diet clear liquid Room service appropriate? Yes; Fluid consistency: Thin  Diet effective now       Comments: Advance, as tolerated, to full regular diet  Question Answer Comment  Room service appropriate? Yes   Fluid consistency: Thin      05/08/24 2023            Objective: Vitals:   05/09/24 0238 05/09/24 0427 05/09/24 0609 05/09/24 1027  BP: 118/74  111/80 109/83  Pulse: 79  63 (!) 54  Resp: 15  16 18   Temp: 97.8 F (36.6 C)  98.2 F (36.8 C) 98.4 F (36.9 C)  TempSrc:   Oral Oral  SpO2: 100%  97% 91%  Weight:  64.9 kg    Height:        Intake/Output Summary (Last 24 hours) at 05/09/2024  1208 Last data filed at 05/09/2024 0827 Gross per 24 hour  Intake 1240 ml  Output 1400 ml  Net -160 ml   Filed Weights   05/09/24 0427  Weight: 64.9 kg    Scheduled Meds:  bictegravir-emtricitabine -tenofovir  AF  1 tablet Oral Daily   chlordiazePOXIDE   25 mg Oral TID   Followed by   NOREEN ON 05/10/2024] chlordiazePOXIDE   25 mg Oral BID   Followed by   NOREEN ON 05/11/2024] chlordiazePOXIDE   25 mg Oral Q1500   folic acid   1  mg Oral Daily   gabapentin   200 mg Oral TID    HYDROmorphone  (DILAUDID ) injection  0.5 mg Intravenous Once   multivitamin with minerals  1 tablet Oral Daily   QUEtiapine   50 mg Oral QHS   risperiDONE   2 mg Oral BID   thiamine   100 mg Oral Daily   Or   thiamine   100 mg Intravenous Daily   Continuous Infusions:  lactated ringers  125 mL/hr at 05/09/24 0640    Nutritional status     Body mass index is 19.39 kg/m.  Data Reviewed:   CBC: Recent Labs  Lab 05/06/24 2114 05/08/24 1032 05/09/24 0438  WBC 3.4* 3.2* 2.5*  NEUTROABS  --  1.7 1.2*  HGB 10.2* 10.5* 9.7*  HCT 31.9* 32.7* 30.4*  MCV 91.4 91.1 91.6  PLT 236 143* 105*   Basic Metabolic Panel: Recent Labs  Lab 05/06/24 2114 05/08/24 1032 05/08/24 2035 05/09/24 0438  NA 140 139  --  132*  K 4.0 3.8  --  3.3*  CL 107 105  --  98  CO2 22 22  --  23  GLUCOSE 114* 158*  --  80  BUN 9 10  --  6  CREATININE 0.57* 0.32*  --  0.42*  CALCIUM  8.5* 8.4*  --  8.0*  MG  --   --  2.3 1.6*  PHOS  --   --   --  3.6   GFR: Estimated Creatinine Clearance: 116.1 mL/min (A) (by C-G formula based on SCr of 0.42 mg/dL (L)). Liver Function Tests: Recent Labs  Lab 05/06/24 2114 05/08/24 1032 05/09/24 0438  AST 47* 40 36  ALT 33 34 31  ALKPHOS 50 57 54  BILITOT 0.3 0.5 0.9  PROT 6.7 6.9 6.3*  ALBUMIN 3.7 3.9 3.5   Recent Labs  Lab 05/06/24 2114 05/08/24 1032  LIPASE 46 39   No results for input(s): AMMONIA in the last 168 hours. Coagulation Profile: No results for input(s): INR, PROTIME in the last 168 hours. Cardiac Enzymes: No results for input(s): CKTOTAL, CKMB, CKMBINDEX, TROPONINI in the last 168 hours. BNP (last 3 results) No results for input(s): PROBNP in the last 8760 hours. HbA1C: No results for input(s): HGBA1C in the last 72 hours. CBG: No results for input(s): GLUCAP in the last 168 hours. Lipid Profile: No results for input(s): CHOL, HDL, LDLCALC, TRIG, CHOLHDL,  LDLDIRECT in the last 72 hours. Thyroid  Function Tests: No results for input(s): TSH, T4TOTAL, FREET4, T3FREE, THYROIDAB in the last 72 hours. Anemia Panel: No results for input(s): VITAMINB12, FOLATE, FERRITIN, TIBC, IRON, RETICCTPCT in the last 72 hours. Sepsis Labs: No results for input(s): PROCALCITON, LATICACIDVEN in the last 168 hours.  No results found for this or any previous visit (from the past 240 hours).       Radiology Studies: CT Angio Abd/Pel W and/or Wo Contrast Result Date: 05/08/2024 CLINICAL DATA:  Pancreatitis, concern for pseudoaneurysm EXAM: CTA ABDOMEN AND PELVIS  WITHOUT AND WITH CONTRAST TECHNIQUE: Multidetector CT imaging of the abdomen and pelvis was performed using the standard protocol during bolus administration of intravenous contrast. Multiplanar reconstructed images and MIPs were obtained and reviewed to evaluate the vascular anatomy. RADIATION DOSE REDUCTION: This exam was performed according to the departmental dose-optimization program which includes automated exposure control, adjustment of the mA and/or kV according to patient size and/or use of iterative reconstruction technique. CONTRAST:  75mL OMNIPAQUE  IOHEXOL  350 MG/ML SOLN COMPARISON:  Same-day CT abdomen pelvis, 05/08/2024 FINDINGS: VASCULAR Angiographic examination is limited by significantly late (portal venous) phase of contrast submitted for review as well as contamination from previously administered contrast and finally streak artifact from patient arm positioning. Within this limitation, there is an enhancing ovoid lesion centered in the posterior pancreatic head, underlying the portal confluence and measuring 1.6 x 1.4 cm (series 18, image 21). This appears to reflect a partially thrombosed pseudoaneurysm, vessel of origin not confidently identified given the technical limitations of the exam but given location most likely arising from posterior pancreaticoduodenal  branches. Normal contour and caliber of the abdominal aorta. No evidence of aneurysm, dissection, or other acute aortic pathology. Standard branching pattern of the abdominal aorta with solitary bilateral renal arteries. Focal effacement of the most central portion of the portal vein at its confluence (series 18, image 20). Early cavernous transformation. Small varices throughout the left upper quadrant (series 18, image 14). Review of the MIP images confirms the above findings. NON-VASCULAR Lower Chest: No acute findings.  Coronary artery calcifications. Hepatobiliary: No solid liver abnormality is seen. Hepatic steatosis. No gallstones, gallbladder wall thickening, or biliary dilatation. Pancreas: Expansile appearance of the pancreatic head with adjacent fat stranding, in keeping with acute on chronic pancreatitis. No pancreatic ductal dilatation. Spleen: Normal in size without significant abnormality. Adrenals/Urinary Tract: Adrenal glands are unremarkable. Kidneys are normal, without renal calculi, solid lesion, or hydronephrosis. Bladder is unremarkable. Stomach/Bowel: Stomach is within normal limits. Appendix appears normal. No evidence of bowel wall thickening, distention, or inflammatory changes. Lymphatic: No enlarged abdominal or pelvic lymph nodes. Reproductive: No mass or other significant abnormality. Other: No abdominal wall hernia or abnormality. No ascites. Musculoskeletal: No acute osseous findings. IMPRESSION: 1. Angiographic examination technically limited as detailed above. Within these limitations, there is an enhancing ovoid lesion centered in the posterior pancreatic head, underlying the portal confluence and measuring 1.6 x 1.4 cm. Tthis appears to reflect a partially thrombosed pseudoaneurysm, vessel of origin not confidently identified given the technical limitations of the exam but given location most likely arising from posterior pancreaticoduodenal branches. 2. Focal effacement of the  most central portion of the portal vein at its confluence. Early cavernous transformation. Small varices throughout the left upper quadrant. 3. Expansile appearance of the pancreatic head with adjacent fat stranding, in keeping with acute on chronic pancreatitis. 4. Hepatic steatosis. 5. Coronary artery disease advanced for patient age. Electronically Signed   By: Marolyn JONETTA Jaksch M.D.   On: 05/08/2024 16:23   CT ABDOMEN PELVIS W CONTRAST Result Date: 05/08/2024 CLINICAL DATA:  Abdominal pain.  Pancreatitis. EXAM: CT ABDOMEN AND PELVIS WITH CONTRAST TECHNIQUE: Multidetector CT imaging of the abdomen and pelvis was performed using the standard protocol following bolus administration of intravenous contrast. RADIATION DOSE REDUCTION: This exam was performed according to the departmental dose-optimization program which includes automated exposure control, adjustment of the mA and/or kV according to patient size and/or use of iterative reconstruction technique. CONTRAST:  OMNIPAQUE  IOHEXOL  300 MG/ML  SOLN COMPARISON:  CT abdomen pelvis dated 03/01/2024. FINDINGS: Evaluation of this exam is limited due to respiratory motion. Lower chest: The visualized lung bases are clear. No intra-abdominal free air or free fluid. Hepatobiliary: Fatty liver. No biliary dilatation. The gallbladder is unremarkable. Pancreas: Ill-defined area of hypodensity in the head of the pancreas in keeping with provided history of pancreatitis. Evaluation is limited due to respiratory motion. The previously seen bilobed hemorrhagic pseudocysts are not seen on today's exam. There is a 1.8 x 2.0 cm ill-defined rounded structure posterior to the head of the pancreas and at the porta splenic confluence (24/2). This structure demonstrates partial enhancement and may represent a pseudoaneurysm. Evaluation however is limited due to respiratory motion and poor visualization. Spleen: Normal in size without focal abnormality. Adrenals/Urinary Tract: The  adrenal glands unremarkable. The kidneys, visualized ureters, and urinary bladder appear unremarkable Stomach/Bowel: There is no bowel obstruction or active inflammation. The appendix is normal. Vascular/Lymphatic: The abdominal aorta and IVC unremarkable. No portal venous gas. There is no adenopathy Reproductive: The prostate and seminal vesicles are grossly remarkable. Other: None Musculoskeletal: Osteopenia with degenerative changes. No acute osseous pathology. IMPRESSION: 1. Ill-defined area of hypodensity in the head of the pancreas in keeping with provided history of pancreatitis. The previously seen bilobed hemorrhagic pseudocysts are not seen on today's exam. 2. A 1.8 x 2.0 cm ill-defined rounded structure posterior to the head of the pancreas and at the porta splenic confluence may represent a pseudoaneurysm. Further evaluation with CTA is recommended. 3. Fatty liver. 4. No bowel obstruction. Normal appendix. Electronically Signed   By: Vanetta Chou M.D.   On: 05/08/2024 13:45           LOS: 1 day   Time spent= 35 mins    Burgess JAYSON Dare, MD [facility]  If 7PM-7AM, please contact night-coverage  05/09/2024, 12:08 PM

## 2024-05-09 NOTE — TOC Initial Note (Signed)
 Transition of Care [facility]) - Initial/Assessment Note    Patient Details  Name: Gary [name] MRN: [id number] Date of Birth: [birth year]  [facility]) CM/SW Contact:    Tawni CHRISTELLA Eva, LCSW Phone Number: 05/09/2024, 10:25 AM  Clinical Narrative:                  Substance use resources are attached to pt's AVS.   Expected Discharge Plan: Homeless Shelter Barriers to Discharge: Continued Medical Work up   Patient Goals and CMS Choice            Expected Discharge Plan and Services       Living arrangements for the past 2 months: Homeless                                      Prior Living Arrangements/Services Living arrangements for the past 2 months: Homeless Lives with:: Self                   Activities of Daily Living   ADL Screening (condition at time of admission) Independently performs ADLs?: Yes (appropriate for developmental age) Is the patient deaf or have difficulty hearing?: No Does the patient have difficulty seeing, even when wearing glasses/contacts?: No Does the patient have difficulty concentrating, remembering, or making decisions?: No  Permission Sought/Granted                  Emotional Assessment       Orientation: : Oriented to Self, Oriented to Place, Oriented to  Time, Oriented to Situation Alcohol  / Substance Use: Alcohol  Use    Admission diagnosis:  Acute pancreatitis [K85.90] Alcoholic intoxication without complication (HCC) [F10.920] Pseudoaneurysm of visceral artery (HCC) [I72.8] Alcohol -induced acute pancreatitis, unspecified complication status [K85.20] Patient Active Problem List   Diagnosis Date Noted   Epigastric pain 05/09/2024   SIRS (systemic inflammatory response syndrome) (HCC) 05/09/2024   Bipolar 2 disorder (HCC) 05/09/2024   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   Brief psychotic disorder (HCC) 04/20/2024   Hypotension 03/31/2024   High risk for readmission  03/01/2024   Alcohol  withdrawal (HCC) 02/25/2024   Severe protein-calorie malnutrition (HCC) 02/25/2024   Alcohol  induced acute pancreatitis 02/14/2024   Pancreatitis 02/13/2024   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/2024   Ileus (HCC) 12/25/2023   PTSD (post-traumatic stress disorder) 12/25/2023   Anxiety state 12/20/2023   Acute pancreatitis 12/17/2023   Tobacco use disorder 12/17/2023   Acute alcoholic pancreatitis 12/10/2023   Acute on chronic anemia 12/05/2023   Seizure (HCC) 11/19/2023   Gastritis and duodenitis 11/19/2023   Chronic pancreatitis (HCC) 10/16/2023   Acute on chronic pancreatitis (HCC) 10/16/2023   Alcoholic fatty liver 09/25/2023   Seizure disorder (HCC) 12/17/2022   Pancytopenia (HCC) 12/14/2022   Alcohol -induced pancreatitis 12/14/2022   Delirium tremens (HCC) 10/19/2022   Alcohol  dependence with withdrawal (HCC) 10/16/2022   Constipation 07/30/2021   Malingering 05/15/2020   Alcohol -induced mood disorder (HCC) 05/13/2019   Alcohol  use disorder, severe, dependence (HCC) 04/16/2019   Severe episode of recurrent major depressive disorder, without psychotic features (HCC) 04/16/2019   HIV (human immunodeficiency virus infection) (HCC) 06/16/2018   Polysubstance abuse (HCC) 06/16/2018   Nicotine  dependence, cigarettes, uncomplicated 06/16/2018   PCP:  Pcp, No Pharmacy:   [facility] - [facility] [address] [place] [address] Phone: [phone] Fax: [phone]  [facility]) #  [id number] [place], [place] - [address] AT [address] [place] [address] Phone: [phone] Fax: [phone]  [id number] - [place], [place] - [address] AT [facility] OF [address] [place] [address] Phone: [phone] Fax: [phone]  [facility] [address] [place] [address] Phone: [phone]  Fax: [phone]  [facility] #[id number] [place], [place] - [address] AT [facility] OF [address] [place] [address] Phone: [phone] Fax: [phone]     Social Drivers of Health (SDOH) Social History: SDOH Screenings   Food Insecurity: Patient Declined (04/18/2024)  Recent Concern: Food Insecurity - Food Insecurity Present (02/19/2024)  Housing: Patient Declined (04/18/2024)  Recent Concern: Housing - High Risk (02/19/2024)  Transportation Needs: Patient Declined (04/18/2024)  Recent Concern: Transportation Needs - Unmet Transportation Needs (02/19/2024)  Utilities: Patient Declined (04/18/2024)  Alcohol  Screen: High Risk (04/17/2024)  Depression (PHQ2-9): Medium Risk (01/28/2024)  Financial Resource Strain: Patient Declined (10/11/2023)  Social Connections: Patient Declined (04/18/2024)  Recent Concern: Social Connections - Socially Isolated (02/03/2024)  Stress: No Stress Concern Present (06/30/2022)   Received from [facility]  Recent Concern: Stress - Stress Concern Present (06/25/2022)   Received from [facility]  Tobacco Use: High Risk (05/09/2024)   SDOH Interventions:     Readmission Risk Interventions    02/14/2024    3:10 PM 12/15/2023   11:23 AM 11/29/2023   10:27 AM  Readmission Risk Prevention Plan  Transportation Screening Complete Complete Complete  Medication Review (RN Care Manager) Complete Complete Complete  PCP or Specialist appointment within 3-5 days of discharge Patient refused Complete   HRI or Home Care Consult Complete Complete Complete  SW Recovery Care/Counseling Consult Complete Complete Complete  Palliative Care Screening Not Applicable Not Applicable Not Applicable  Skilled Nursing Facility Not Applicable Not Applicable Not Applicable

## 2024-05-09 NOTE — Progress Notes (Signed)
 Patient A/Ox4 & unfortunately demanded to leave AMA again. MD Caleen & charge RN Etta successfully notified. Patient signed AMA form & verbalized understanding of the risks/concerns of not completing medically advised care. IV's removed by this RN & Etta naomi PEAK.

## 2024-05-11 ENCOUNTER — Emergency Department
Admission: EM | Admit: 2024-05-11 | Discharge: 2024-05-11 | Disposition: A | Discharge status: Home / Self Care | Payer: MEDICAID | Attending: Emergency Medicine | Admitting: Emergency Medicine

## 2024-05-11 ENCOUNTER — Other Ambulatory Visit: Payer: Self-pay

## 2024-05-11 DIAGNOSIS — F1090 Alcohol use, unspecified, uncomplicated: Secondary | ICD-10-CM | POA: Insufficient documentation

## 2024-05-11 DIAGNOSIS — Y908 Blood alcohol level of 240 mg/100 ml or more: Secondary | ICD-10-CM | POA: Diagnosis not present

## 2024-05-11 DIAGNOSIS — F10129 Alcohol abuse with intoxication, unspecified: Secondary | ICD-10-CM | POA: Diagnosis present

## 2024-05-11 DIAGNOSIS — Z21 Asymptomatic human immunodeficiency virus [HIV] infection status: Secondary | ICD-10-CM | POA: Diagnosis not present

## 2024-05-11 DIAGNOSIS — K860 Alcohol-induced chronic pancreatitis: Secondary | ICD-10-CM | POA: Insufficient documentation

## 2024-05-11 DIAGNOSIS — Z765 Malingerer [conscious simulation]: Secondary | ICD-10-CM

## 2024-05-11 LAB — CBC WITH DIFFERENTIAL/PLATELET
Abs Immature Granulocytes: 0.01 10*3/uL (ref 0.00–0.07)
Basophils Absolute: 0 10*3/uL (ref 0.0–0.1)
Basophils Relative: 0 %
Eosinophils Absolute: 0.1 10*3/uL (ref 0.0–0.5)
Eosinophils Relative: 1 %
HCT: 34 % — ABNORMAL LOW (ref 39.0–52.0)
Hemoglobin: 11.2 g/dL — ABNORMAL LOW (ref 13.0–17.0)
Immature Granulocytes: 0 %
Lymphocytes Relative: 17 %
Lymphs Abs: 0.8 10*3/uL (ref 0.7–4.0)
MCH: 29.6 pg (ref 26.0–34.0)
MCHC: 32.9 g/dL (ref 30.0–36.0)
MCV: 89.7 fL (ref 80.0–100.0)
Monocytes Absolute: 0.2 10*3/uL (ref 0.1–1.0)
Monocytes Relative: 5 %
Neutro Abs: 3.4 10*3/uL (ref 1.7–7.7)
Neutrophils Relative %: 77 %
Platelets: 96 10*3/uL — ABNORMAL LOW (ref 150–400)
RBC: 3.79 MIL/uL — ABNORMAL LOW (ref 4.22–5.81)
RDW: 18.8 % — ABNORMAL HIGH (ref 11.5–15.5)
WBC: 4.5 10*3/uL (ref 4.0–10.5)
nRBC: 0 % (ref 0.0–0.2)

## 2024-05-11 LAB — COMPREHENSIVE METABOLIC PANEL WITH GFR
ALT: 40 U/L (ref 0–44)
AST: 64 U/L — ABNORMAL HIGH (ref 15–41)
Albumin: 4.4 g/dL (ref 3.5–5.0)
Alkaline Phosphatase: 65 U/L (ref 38–126)
Anion gap: 12 (ref 5–15)
BUN: 7 mg/dL (ref 6–20)
CO2: 27 mmol/L (ref 22–32)
Calcium: 9.2 mg/dL (ref 8.9–10.3)
Chloride: 99 mmol/L (ref 98–111)
Creatinine, Ser: 0.64 mg/dL (ref 0.61–1.24)
GFR, Estimated: >60 mL/min (ref >60)
Glucose, Bld: 90 mg/dL (ref 70–99)
Potassium: 3.8 mmol/L (ref 3.5–5.1)
Sodium: 138 mmol/L (ref 135–145)
Total Bilirubin: 0.9 mg/dL (ref 0.0–1.2)
Total Protein: 7.8 g/dL (ref 6.5–8.1)

## 2024-05-11 LAB — ETHANOL: Alcohol, Ethyl (B): 267 mg/dL — ABNORMAL HIGH (ref <15)

## 2024-05-11 LAB — LIPASE, BLOOD: Lipase: 36 U/L (ref 11–51)

## 2024-05-11 MED ORDER — KETOROLAC TROMETHAMINE 30 MG/ML IJ SOLN: 15.0000 mg | Freq: Once | INTRAMUSCULAR | Status: DC

## 2024-05-11 NOTE — ED Provider Notes (Addendum)
 [facility] Provider Note    Event Date/Time   First MD Initiated Contact with Patient 05/11/24 [phone]     (approximate)   History   Alcohol  Intoxication   HPI Gary [name] is a 38 y.o. male with 54 ED visits in the last 6 months and 15 admissions as a result of his chronic alcohol  abuse, polysubstance abuse, and chronic pancreatitis.  He is also HIV positive.  He presents by [facility] reportedly at his request.  The police department called EMS when he was found sitting on the sidewalk in downtown [place].  He reportedly told [facility] that he wanted to come to [facility] rather than [facility] or [facility], so they brought him here.  He reports he has a brother in [place].  He told the nurses at [facility] as he was being brought in that he was just seeking shelter tonight.  The patient tells me that he suffers from chronic abdominal pain and that his symptoms are the same as they have always been.  However he states that he needs pain medicine to make his pancreatitis better.  He admits to recent alcohol  use.     Physical Exam   Triage Vital Signs: ED Triage Vitals  Encounter Vitals Group     BP 05/11/24 0104 102/74     Girls Systolic BP Percentile --      Girls Diastolic BP Percentile --      Boys Systolic BP Percentile --      Boys Diastolic BP Percentile --      Pulse Rate 05/11/24 0104 91     Resp 05/11/24 0104 18     Temp 05/11/24 0104 98 F (36.7 C)     Temp Source 05/11/24 0104 Oral     SpO2 05/11/24 0104 100 %     Weight --      Height --      Head Circumference --      Peak Flow --      Pain Score 05/11/24 0103 9     Pain Loc --      Pain Education --      Exclude from Growth Chart --     Most recent vital signs: Vitals:   05/11/24 0104  BP: 102/74  Pulse: 91  Resp: 18  Temp: 98 F (36.7 C)  SpO2: 100%    General: Awake, alert.  Disheveled, malodorous, and cachectic.  Chronic ill  appearance but nontoxic. CV:  Good peripheral perfusion.  Regular rate and rhythm. Resp:  Normal effort. Speaking easily and comfortably, no accessory muscle usage nor intercostal retractions.   Abd:  generalized tenderness to palpation throughout the abdomen.  Nonperitoneal. Other:  Speaks slowly but is verbally confrontational.  See ED course for details.   ED Results / Procedures / Treatments   Labs (all labs ordered are listed, but only abnormal results are displayed) Labs Reviewed  LIPASE, BLOOD  CBC WITH DIFFERENTIAL/PLATELET  COMPREHENSIVE METABOLIC PANEL WITH GFR  ETHANOL     PROCEDURES:  Critical Care performed: No  Procedures    IMPRESSION / MDM / ASSESSMENT AND PLAN / ED COURSE  I reviewed the triage vital signs and the nursing notes.                              Differential diagnosis includes, but is not limited to, chronic pancreatitis, polysubstance abuse, electrolyte or  metabolic abnormality, acute pancreatitis, drug-seeking behavior, malingering.  Patient's presentation is most consistent with exacerbation of chronic illness.  Labs/studies ordered: Ethanol level, CBC with differential, CMP, lipase  Interventions/Medications given:  Medications - No data to display  (Note:  hospital course my include additional interventions and/or labs/studies not listed above.)   I reviewed the patient's medical record including numerous admissions mostly to [facility] in [place] but also some prior admissions at [facility].  I also read the note by Dr. Caleen from a hospitalization about 4 days ago at [facility] where the patient had similar complaints.  At that time his lipase was normal and he left AMA after becoming angry that he was not being given enough opioid pain medication.  I had an extensive conversation with the patient.  Initially he said that he called EMS and requested to come here because he has had some run-ins with people in [place] and  has had to do things to survive in [place] that made him not want to stay there.  After I explained to him that this is not a shelter, he said that he needs treatment for his pancreatitis and that he needs pain medicine.  After an extended conversation where I explained that I would check his labs and I would be happy to treat him with any nonnarcotic pain medication I have available to me to make him more comfortable, he became increasingly verbally hostile, cursing at me and refusing anything that was nonnarcotic.  After I thought we had agreed to check his labs and reassess, he started asking for food trays and chocolate milk.  I explained that if he has pancreatitis he cannot eat or drink anything and he became increasingly angry and hostile and then started calling me a Nazi.  It is clear to me he has no acute or emergent condition tonight and his behavior violates the patient code of conduct policy of [facility].  I explained to him that he may follow-up at any other facility including where he receives most of his care at [facility] but that his behavior would not be tolerated here and he is been discharged and escorted off the property by security.  He is ambulatory without any apparent difficulty.  Labs were already in process at the time he was discharged and escorted off. Clinical Course as of 05/11/24 0204  Thu May 11, 2024  0204 Stable CMP, normal lipase [CF]    Clinical Course User Index [CF] Gordan Huxley, MD     FINAL CLINICAL IMPRESSION(S) / ED DIAGNOSES   Final diagnoses:  Alcohol -induced chronic pancreatitis (HCC)  Drug-seeking behavior     Rx / DC Orders   ED Discharge Orders     None        Note:  This document was prepared using Dragon voice recognition software and may include unintentional dictation errors.   Gordan Huxley, MD 05/11/24 9796    Gordan Huxley, MD 05/11/24 [phone]

## 2024-05-11 NOTE — ED Notes (Addendum)
 Pt cussing at staff and EDP. Pt biting off his hospital arm band and demanding staff call his brother. Pt not cooperative with medical plan of care. Pt to EDP sts, You Nazi, you are Nazi. EDP explains that behavior will not be tolerated and pt is more than welcome to leave if he wishes. Pt continues behavior and discharge paperwork provided, IV removed and security escorts pt out of Emergency Department.   Writer attempted to call pts brother listed in chart, per pts request and call was unsuccessful with recording stating not available and no voicemail set up at this time. Contact called was Leor Whyte @ [phone]

## 2024-05-11 NOTE — ED Notes (Signed)
 Provider met with Pt directly in front of me.  Provider listened to Pt's complaints and discussed his concerns.  Provider discussed Pt's recent visit to [facility] 4 days ago and lipase being normal.  Provider offered patient to run the blood work that was already sent down to lab to check on any changes with lipase. Patient stated over and over about needing medications for pain. Provider stated he would give him pain meds but will not give narcotics.   Provider ordered toradol  and patient refused it stating that is like vitamins and then said I mean Ibuprofen .  Patient continued to cuss about the provider for not giving him the pain meds that he needed for his pancreatitis.  Provider again offered to run the lab work for him and patient stated  that man won't do anything for me, he will just tell me no.  I explained to him that we had his blood to check his levels and he said, no, they won't give me anything.  Can I get a milk I leave? I don't want your chicken sandwich.  Did not provide patient milk because of his complaints of pancreatitis.

## 2024-05-11 NOTE — ED Triage Notes (Signed)
 BIB EMS after PD called after the patient was found sitting on his buttock on the sidewalk slumped over. ETOH on board. Patient C/O back, leg, and abdominal pain which is chronic for him per EMS. Denies any falls or injuries. Patient states he was just seeking shelter tonight.

## 2024-05-16 ENCOUNTER — Emergency Department
Admission: EM | Admit: 2024-05-16 | Discharge: 2024-05-16 | Disposition: A | Discharge status: Home / Self Care | Payer: MEDICAID | Source: Home / Self Care | Attending: Emergency Medicine | Admitting: Emergency Medicine

## 2024-05-16 DIAGNOSIS — Z21 Asymptomatic human immunodeficiency virus [HIV] infection status: Secondary | ICD-10-CM | POA: Insufficient documentation

## 2024-05-16 DIAGNOSIS — Y908 Blood alcohol level of 240 mg/100 ml or more: Secondary | ICD-10-CM | POA: Insufficient documentation

## 2024-05-16 DIAGNOSIS — F10920 Alcohol use, unspecified with intoxication, uncomplicated: Secondary | ICD-10-CM

## 2024-05-16 DIAGNOSIS — F10129 Alcohol abuse with intoxication, unspecified: Secondary | ICD-10-CM | POA: Insufficient documentation

## 2024-05-16 LAB — ETHANOL: Alcohol, Ethyl (B): 409 mg/dL (ref <15)

## 2024-05-16 LAB — COMPREHENSIVE METABOLIC PANEL WITH GFR
ALT: 27 U/L (ref 0–44)
AST: 28 U/L (ref 15–41)
Albumin: 3.6 g/dL (ref 3.5–5.0)
Alkaline Phosphatase: 44 U/L (ref 38–126)
Anion gap: 9 (ref 5–15)
BUN: 7 mg/dL (ref 6–20)
CO2: 27 mmol/L (ref 22–32)
Calcium: 8.3 mg/dL — ABNORMAL LOW (ref 8.9–10.3)
Chloride: 108 mmol/L (ref 98–111)
Creatinine, Ser: 0.56 mg/dL — ABNORMAL LOW (ref 0.61–1.24)
GFR, Estimated: >60 mL/min (ref >60)
Glucose, Bld: 95 mg/dL (ref 70–99)
Potassium: 3.9 mmol/L (ref 3.5–5.1)
Sodium: 144 mmol/L (ref 135–145)
Total Bilirubin: 0.6 mg/dL (ref 0.0–1.2)
Total Protein: 6.6 g/dL (ref 6.5–8.1)

## 2024-05-16 LAB — CBC
HCT: 31.8 % — ABNORMAL LOW (ref 39.0–52.0)
Hemoglobin: 10.1 g/dL — ABNORMAL LOW (ref 13.0–17.0)
MCH: 29.2 pg (ref 26.0–34.0)
MCHC: 31.8 g/dL (ref 30.0–36.0)
MCV: 91.9 fL (ref 80.0–100.0)
Platelets: 191 K/uL (ref 150–400)
RBC: 3.46 MIL/uL — ABNORMAL LOW (ref 4.22–5.81)
RDW: 19.6 % — ABNORMAL HIGH (ref 11.5–15.5)
WBC: 3.6 K/uL — ABNORMAL LOW (ref 4.0–10.5)
nRBC: 0 % (ref 0.0–0.2)

## 2024-05-16 NOTE — ED Triage Notes (Signed)
 Pt presents to the ED via PD. Pt states that he lives outside. Pt difficult with answering questions in triage. This RN asked patient if he had any pain to which pt responded does it look like I feel well. I'm feeling like I want to go jump off a cliff and end it all. Pt also states I want to hide behind a rock. I am small and weak. Pt reports alcoholism. Pt states I won't be here long. Pt reports seeing things and hearing people in triage. Pt states I only listen to women. I don't listen to men.   Hx of alcohol  abuse, bipolar, anxiety, HIV, schizophrenia, and seizures.   Suicide not completed during triage due to patient getting upset over questions. Pt did vocalize suicidal ideation.

## 2024-05-16 NOTE — ED Notes (Signed)
 Assumed care of patient, pt resting in stretcher with eyes shut, respirations even and unlabored, no signs of distress noted

## 2024-05-16 NOTE — ED Notes (Signed)
 Pt dressed out by OBIE Bouchard, EDT Bouchard and EDT Christina Pt belongings include: Black jacket Black ball cap Black tshirt  Pink shorts with a blue belt Brown boots White socks  Blue underwear

## 2024-05-16 NOTE — ED Provider Notes (Signed)
 [facility] Provider Note    Event Date/Time   First MD Initiated Contact with Patient 05/16/24 1503     (approximate)   History   Chief Complaint Psychiatric Evaluation   HPI  Gary [name] is a 38 y.o. male with past medical history of HIV, polysubstance abuse, and alcohol  abuse who presents to the ED for psychiatric evaluation.  Patient states that he is here due to issues with alcohol , admits that he drinks on a daily basis and had 3 bottles of wine today with his last drink being 1 hour prior to arrival.  He states that he would like help with quitting drinking, denies any medical complaints at this time.  He initially reported suicidal ideation in triage, but now denies this to me.  He states he is not having any thoughts of harming himself or others, denies substance abuse outside of alcohol .      Physical Exam   Triage Vital Signs: ED Triage Vitals  Encounter Vitals Group     BP 05/16/24 1211 100/61     Girls Systolic BP Percentile --      Girls Diastolic BP Percentile --      Boys Systolic BP Percentile --      Boys Diastolic BP Percentile --      Pulse Rate 05/16/24 1211 70     Resp 05/16/24 1211 15     Temp 05/16/24 1211 98.2 F (36.8 C)     Temp Source 05/16/24 1211 Oral     SpO2 05/16/24 1211 95 %     Weight 05/16/24 1220 141 lb 1.5 oz (64 kg)     Height 05/16/24 1220 6' (1.829 m)     Head Circumference --      Peak Flow --      Pain Score 05/16/24 1219 10     Pain Loc --      Pain Education --      Exclude from Growth Chart --     Most recent vital signs: Vitals:   05/16/24 1314 05/16/24 1825  BP: 97/64 104/80  Pulse: 81 72  Resp: 14 17  Temp:  98.4 F (36.9 C)  SpO2: 94% 98%    Constitutional: Alert and oriented. Eyes: Conjunctivae are normal. Head: Atraumatic. Nose: No congestion/rhinnorhea. Mouth/Throat: Mucous membranes are moist.  Cardiovascular: Normal rate, regular rhythm. Grossly normal heart sounds.  2+  radial pulses bilaterally. Respiratory: Normal respiratory effort.  No retractions. Lungs CTAB. Gastrointestinal: Soft and nontender. No distention. Musculoskeletal: No lower extremity tenderness nor edema.  Neurologic:  Normal speech and language. No gross focal neurologic deficits are appreciated.    ED Results / Procedures / Treatments   Labs (all labs ordered are listed, but only abnormal results are displayed) Labs Reviewed  COMPREHENSIVE METABOLIC PANEL WITH GFR - Abnormal; Notable for the following components:      Result Value   Creatinine, Ser 0.56 (*)    Calcium  8.3 (*)    All other components within normal limits  ETHANOL - Abnormal; Notable for the following components:   Alcohol , Ethyl (B) 409 (*)    All other components within normal limits  CBC - Abnormal; Notable for the following components:   WBC 3.6 (*)    RBC 3.46 (*)    Hemoglobin 10.1 (*)    HCT 31.8 (*)    RDW 19.6 (*)    All other components within normal limits  URINE DRUG SCREEN, QUALITATIVE ([facility] ONLY)  EKG  ED ECG REPORT I, Carlin Palin, the attending physician, personally viewed and interpreted this ECG.   Date: 05/16/2024  EKG Time: 12:23  Rate: 87  Rhythm: normal sinus rhythm  Axis: Normal  Intervals:none  ST&T Change: None  PROCEDURES:  Critical Care performed: No  Procedures   MEDICATIONS ORDERED IN ED: Medications - No data to display   IMPRESSION / MDM / ASSESSMENT AND PLAN / ED COURSE  I reviewed the triage vital signs and the nursing notes.                              38 y.o. male with past medical history of HIV, polysubstance use, alcohol  abuse who presents to the ED requesting assistance with chronic alcohol  abuse.  Patient's presentation is most consistent with acute presentation with potential threat to life or bodily function.  Differential diagnosis includes, but is not limited to, alcohol  intoxication, alcohol  withdrawal, dehydration, electrolyte  abnormality, AKI, suicidal ideation.  Patient nontoxic-appearing and in no acute distress, vital signs are unremarkable.  Patient initially reported suicidal ideation, but now denies any thoughts of harming himself or others.  He was initially intoxicated on arrival to the ED with alcohol  level of 409, suicidal ideation seems to have resolved as he has metabolized this.  Labs are reassuring with no significant anemia, leukocytosis, electrolyte abnormality, or AKI.  LFTs are unremarkable and we will observe patient until he is clinically sober.  No indication for IVC and patient was offered psychiatric evaluation, but he declines.  Patient appears clinically sober on reassessment, tolerating oral intake without difficulty and ambulatory with steady gait.  He continues to deny suicidal ideation, will provide with outpatient resources for alcohol  detox.  He was counseled to return to the ED for new or worsening symptoms, patient agrees with plan.      FINAL CLINICAL IMPRESSION(S) / ED DIAGNOSES   Final diagnoses:  Alcoholic intoxication without complication (HCC)     Rx / DC Orders   ED Discharge Orders     None        Note:  This document was prepared using Dragon voice recognition software and may include unintentional dictation errors.   Palin Carlin, MD 05/16/24 [phone]

## 2024-05-16 NOTE — ED Notes (Signed)
 Personal belongings returned to patient, patient changing back out to street clothes.

## 2024-05-16 NOTE — ED Notes (Signed)
 Patient discharged home in good condition, d/c papers provided and reviewed. Ambulatory by self to discharge.

## 2024-05-16 NOTE — ED Notes (Signed)
 PT  VOL

## 2024-05-18 ENCOUNTER — Other Ambulatory Visit: Payer: Self-pay

## 2024-05-18 ENCOUNTER — Inpatient Hospital Stay
Admission: EM | Admit: 2024-05-18 | Discharge: 2024-05-21 | DRG: 438 | Disposition: A | Discharge status: Home / Self Care | Payer: MEDICAID | Attending: Internal Medicine | Admitting: Internal Medicine

## 2024-05-18 DIAGNOSIS — F1910 Other psychoactive substance abuse, uncomplicated: Secondary | ICD-10-CM | POA: Diagnosis present

## 2024-05-18 DIAGNOSIS — Z21 Asymptomatic human immunodeficiency virus [HIV] infection status: Secondary | ICD-10-CM | POA: Diagnosis present

## 2024-05-18 DIAGNOSIS — Z5900 Homelessness unspecified: Secondary | ICD-10-CM

## 2024-05-18 DIAGNOSIS — Z8249 Family history of ischemic heart disease and other diseases of the circulatory system: Secondary | ICD-10-CM

## 2024-05-18 DIAGNOSIS — T675XXA Heat exhaustion, unspecified, initial encounter: Secondary | ICD-10-CM | POA: Diagnosis present

## 2024-05-18 DIAGNOSIS — E871 Hypo-osmolality and hyponatremia: Secondary | ICD-10-CM | POA: Diagnosis present

## 2024-05-18 DIAGNOSIS — K852 Alcohol induced acute pancreatitis without necrosis or infection: Principal | ICD-10-CM | POA: Diagnosis present

## 2024-05-18 DIAGNOSIS — K859 Acute pancreatitis without necrosis or infection, unspecified: Secondary | ICD-10-CM | POA: Diagnosis present

## 2024-05-18 DIAGNOSIS — E876 Hypokalemia: Secondary | ICD-10-CM | POA: Diagnosis present

## 2024-05-18 DIAGNOSIS — F10220 Alcohol dependence with intoxication, uncomplicated: Secondary | ICD-10-CM | POA: Diagnosis present

## 2024-05-18 DIAGNOSIS — Z681 Body mass index (BMI) 19 or less, adult: Secondary | ICD-10-CM

## 2024-05-18 DIAGNOSIS — F10921 Alcohol use, unspecified with intoxication delirium: Secondary | ICD-10-CM | POA: Diagnosis present

## 2024-05-18 DIAGNOSIS — E860 Dehydration: Secondary | ICD-10-CM | POA: Diagnosis present

## 2024-05-18 DIAGNOSIS — K219 Gastro-esophageal reflux disease without esophagitis: Secondary | ICD-10-CM | POA: Diagnosis present

## 2024-05-18 DIAGNOSIS — F3181 Bipolar II disorder: Secondary | ICD-10-CM | POA: Diagnosis present

## 2024-05-18 DIAGNOSIS — T679XXA Effect of heat and light, unspecified, initial encounter: Secondary | ICD-10-CM

## 2024-05-18 DIAGNOSIS — E43 Unspecified severe protein-calorie malnutrition: Secondary | ICD-10-CM | POA: Diagnosis present

## 2024-05-18 DIAGNOSIS — F10230 Alcohol dependence with withdrawal, uncomplicated: Secondary | ICD-10-CM | POA: Diagnosis present

## 2024-05-18 DIAGNOSIS — K860 Alcohol-induced chronic pancreatitis: Secondary | ICD-10-CM | POA: Diagnosis present

## 2024-05-18 DIAGNOSIS — Y908 Blood alcohol level of 240 mg/100 ml or more: Secondary | ICD-10-CM | POA: Diagnosis present

## 2024-05-18 DIAGNOSIS — R1013 Epigastric pain: Secondary | ICD-10-CM

## 2024-05-18 DIAGNOSIS — R45851 Suicidal ideations: Secondary | ICD-10-CM | POA: Diagnosis present

## 2024-05-18 DIAGNOSIS — K760 Fatty (change of) liver, not elsewhere classified: Secondary | ICD-10-CM | POA: Diagnosis present

## 2024-05-18 DIAGNOSIS — Z6372 Alcoholism and drug addiction in family: Secondary | ICD-10-CM

## 2024-05-18 DIAGNOSIS — F17210 Nicotine dependence, cigarettes, uncomplicated: Secondary | ICD-10-CM | POA: Diagnosis present

## 2024-05-18 DIAGNOSIS — F4310 Post-traumatic stress disorder, unspecified: Secondary | ICD-10-CM | POA: Diagnosis present

## 2024-05-18 DIAGNOSIS — X30XXXA Exposure to excessive natural heat, initial encounter: Secondary | ICD-10-CM

## 2024-05-18 DIAGNOSIS — Z888 Allergy status to other drugs, medicaments and biological substances status: Secondary | ICD-10-CM

## 2024-05-18 DIAGNOSIS — F10920 Alcohol use, unspecified with intoxication, uncomplicated: Principal | ICD-10-CM | POA: Diagnosis present

## 2024-05-18 DIAGNOSIS — Z79899 Other long term (current) drug therapy: Secondary | ICD-10-CM

## 2024-05-18 DIAGNOSIS — I959 Hypotension, unspecified: Secondary | ICD-10-CM

## 2024-05-18 DIAGNOSIS — Z811 Family history of alcohol abuse and dependence: Secondary | ICD-10-CM

## 2024-05-18 DIAGNOSIS — B20 Human immunodeficiency virus [HIV] disease: Secondary | ICD-10-CM | POA: Diagnosis present

## 2024-05-18 DIAGNOSIS — F419 Anxiety disorder, unspecified: Secondary | ICD-10-CM | POA: Diagnosis present

## 2024-05-18 DIAGNOSIS — Z1152 Encounter for screening for COVID-19: Secondary | ICD-10-CM

## 2024-05-18 DIAGNOSIS — K8591 Acute pancreatitis with uninfected necrosis, unspecified: Secondary | ICD-10-CM | POA: Diagnosis present

## 2024-05-18 LAB — COMPREHENSIVE METABOLIC PANEL WITH GFR
ALT: 23 U/L (ref 0–44)
AST: 38 U/L (ref 15–41)
Albumin: 3.8 g/dL (ref 3.5–5.0)
Alkaline Phosphatase: 47 U/L (ref 38–126)
Anion gap: 13 (ref 5–15)
BUN: 6 mg/dL (ref 6–20)
CO2: 23 mmol/L (ref 22–32)
Calcium: 8.3 mg/dL — ABNORMAL LOW (ref 8.9–10.3)
Chloride: 106 mmol/L (ref 98–111)
Creatinine, Ser: 0.56 mg/dL — ABNORMAL LOW (ref 0.61–1.24)
GFR, Estimated: >60 mL/min (ref >60)
Glucose, Bld: 122 mg/dL — ABNORMAL HIGH (ref 70–99)
Potassium: 3.3 mmol/L — ABNORMAL LOW (ref 3.5–5.1)
Sodium: 142 mmol/L (ref 135–145)
Total Bilirubin: 0.4 mg/dL (ref 0.0–1.2)
Total Protein: 7.3 g/dL (ref 6.5–8.1)

## 2024-05-18 LAB — CBC
HCT: 33 % — ABNORMAL LOW (ref 39.0–52.0)
Hemoglobin: 10.7 g/dL — ABNORMAL LOW (ref 13.0–17.0)
MCH: 29.6 pg (ref 26.0–34.0)
MCHC: 32.4 g/dL (ref 30.0–36.0)
MCV: 91.2 fL (ref 80.0–100.0)
Platelets: 273 K/uL (ref 150–400)
RBC: 3.62 MIL/uL — ABNORMAL LOW (ref 4.22–5.81)
RDW: 19 % — ABNORMAL HIGH (ref 11.5–15.5)
WBC: 4 K/uL (ref 4.0–10.5)
nRBC: 0 % (ref 0.0–0.2)

## 2024-05-18 LAB — ETHANOL: Alcohol, Ethyl (B): 496 mg/dL (ref <15)

## 2024-05-18 LAB — LIPASE, BLOOD: Lipase: 35 U/L (ref 11–51)

## 2024-05-18 MED ORDER — ONDANSETRON 4 MG PO TBDP
4.0000 mg | ORAL_TABLET | Freq: Once | ORAL | Status: AC | PRN
  Administered 2024-05-18: 4 mg via ORAL
  Filled 2024-05-18: qty 1

## 2024-05-18 NOTE — ED Triage Notes (Signed)
 Patient reports he is here for drinking alcohol , pancreatitis and heat exhaustion. Drinks daily. Patient was drinking wine before coming in. Reports abdominal pain with some nausea. Patient reports he lives outside and has been in the heat all day. State his boyfriend will not take him in.

## 2024-05-18 NOTE — ED Triage Notes (Signed)
 First Nurse Note:  Pt via [facility] from [facility]. Pt c/o alcohol  intoxicated. States that he knows he is going to want to kill himself once the withdrawals hit. Pt is alert but seems intoxicated.  109/74 BP 91 HR  94% on RA

## 2024-05-19 ENCOUNTER — Emergency Department: Payer: MEDICAID

## 2024-05-19 DIAGNOSIS — Z79899 Other long term (current) drug therapy: Secondary | ICD-10-CM | POA: Diagnosis not present

## 2024-05-19 DIAGNOSIS — K852 Alcohol induced acute pancreatitis without necrosis or infection: Secondary | ICD-10-CM | POA: Diagnosis present

## 2024-05-19 DIAGNOSIS — T675XXA Heat exhaustion, unspecified, initial encounter: Secondary | ICD-10-CM | POA: Diagnosis present

## 2024-05-19 DIAGNOSIS — K869 Disease of pancreas, unspecified: Secondary | ICD-10-CM | POA: Diagnosis not present

## 2024-05-19 DIAGNOSIS — K860 Alcohol-induced chronic pancreatitis: Secondary | ICD-10-CM | POA: Diagnosis present

## 2024-05-19 DIAGNOSIS — E860 Dehydration: Secondary | ICD-10-CM | POA: Diagnosis present

## 2024-05-19 DIAGNOSIS — F10939 Alcohol use, unspecified with withdrawal, unspecified: Secondary | ICD-10-CM | POA: Diagnosis not present

## 2024-05-19 DIAGNOSIS — F17210 Nicotine dependence, cigarettes, uncomplicated: Secondary | ICD-10-CM | POA: Diagnosis present

## 2024-05-19 DIAGNOSIS — Z8249 Family history of ischemic heart disease and other diseases of the circulatory system: Secondary | ICD-10-CM | POA: Diagnosis not present

## 2024-05-19 DIAGNOSIS — F10930 Alcohol use, unspecified with withdrawal, uncomplicated: Secondary | ICD-10-CM | POA: Diagnosis not present

## 2024-05-19 DIAGNOSIS — F1910 Other psychoactive substance abuse, uncomplicated: Secondary | ICD-10-CM | POA: Diagnosis present

## 2024-05-19 DIAGNOSIS — Z5900 Homelessness unspecified: Secondary | ICD-10-CM | POA: Diagnosis not present

## 2024-05-19 DIAGNOSIS — Z811 Family history of alcohol abuse and dependence: Secondary | ICD-10-CM | POA: Diagnosis not present

## 2024-05-19 DIAGNOSIS — R45851 Suicidal ideations: Secondary | ICD-10-CM | POA: Diagnosis present

## 2024-05-19 DIAGNOSIS — Z21 Asymptomatic human immunodeficiency virus [HIV] infection status: Secondary | ICD-10-CM | POA: Diagnosis present

## 2024-05-19 DIAGNOSIS — F419 Anxiety disorder, unspecified: Secondary | ICD-10-CM | POA: Diagnosis present

## 2024-05-19 DIAGNOSIS — F17200 Nicotine dependence, unspecified, uncomplicated: Secondary | ICD-10-CM | POA: Diagnosis not present

## 2024-05-19 DIAGNOSIS — E43 Unspecified severe protein-calorie malnutrition: Secondary | ICD-10-CM | POA: Diagnosis present

## 2024-05-19 DIAGNOSIS — T679XXA Effect of heat and light, unspecified, initial encounter: Secondary | ICD-10-CM

## 2024-05-19 DIAGNOSIS — Z1152 Encounter for screening for COVID-19: Secondary | ICD-10-CM | POA: Diagnosis not present

## 2024-05-19 DIAGNOSIS — K760 Fatty (change of) liver, not elsewhere classified: Secondary | ICD-10-CM | POA: Diagnosis present

## 2024-05-19 DIAGNOSIS — X30XXXA Exposure to excessive natural heat, initial encounter: Secondary | ICD-10-CM | POA: Diagnosis not present

## 2024-05-19 DIAGNOSIS — E876 Hypokalemia: Secondary | ICD-10-CM | POA: Diagnosis present

## 2024-05-19 DIAGNOSIS — Z681 Body mass index (BMI) 19 or less, adult: Secondary | ICD-10-CM | POA: Diagnosis not present

## 2024-05-19 DIAGNOSIS — Y908 Blood alcohol level of 240 mg/100 ml or more: Secondary | ICD-10-CM | POA: Diagnosis present

## 2024-05-19 DIAGNOSIS — F418 Other specified anxiety disorders: Secondary | ICD-10-CM | POA: Diagnosis not present

## 2024-05-19 DIAGNOSIS — K861 Other chronic pancreatitis: Secondary | ICD-10-CM | POA: Diagnosis not present

## 2024-05-19 DIAGNOSIS — K859 Acute pancreatitis without necrosis or infection, unspecified: Secondary | ICD-10-CM | POA: Diagnosis not present

## 2024-05-19 DIAGNOSIS — R112 Nausea with vomiting, unspecified: Secondary | ICD-10-CM | POA: Diagnosis not present

## 2024-05-19 DIAGNOSIS — F10920 Alcohol use, unspecified with intoxication, uncomplicated: Secondary | ICD-10-CM | POA: Diagnosis not present

## 2024-05-19 DIAGNOSIS — R1013 Epigastric pain: Secondary | ICD-10-CM | POA: Diagnosis not present

## 2024-05-19 DIAGNOSIS — F10220 Alcohol dependence with intoxication, uncomplicated: Secondary | ICD-10-CM | POA: Diagnosis present

## 2024-05-19 DIAGNOSIS — F10230 Alcohol dependence with withdrawal, uncomplicated: Secondary | ICD-10-CM | POA: Diagnosis present

## 2024-05-19 DIAGNOSIS — F3181 Bipolar II disorder: Secondary | ICD-10-CM | POA: Diagnosis present

## 2024-05-19 DIAGNOSIS — E871 Hypo-osmolality and hyponatremia: Secondary | ICD-10-CM | POA: Diagnosis present

## 2024-05-19 DIAGNOSIS — K219 Gastro-esophageal reflux disease without esophagitis: Secondary | ICD-10-CM | POA: Diagnosis not present

## 2024-05-19 LAB — BASIC METABOLIC PANEL WITH GFR
Anion gap: 8 (ref 5–15)
BUN: 6 mg/dL (ref 6–20)
CO2: 25 mmol/L (ref 22–32)
Calcium: 7.3 mg/dL — ABNORMAL LOW (ref 8.9–10.3)
Chloride: 107 mmol/L (ref 98–111)
Creatinine, Ser: 0.44 mg/dL — ABNORMAL LOW (ref 0.61–1.24)
GFR, Estimated: >60 mL/min (ref >60)
Glucose, Bld: 87 mg/dL (ref 70–99)
Potassium: 3.9 mmol/L (ref 3.5–5.1)
Sodium: 140 mmol/L (ref 135–145)

## 2024-05-19 LAB — LACTIC ACID, PLASMA: Lactic Acid, Venous: 1 mmol/L (ref 0.5–1.9)

## 2024-05-19 LAB — URINE DRUG SCREEN, QUALITATIVE (ARMC ONLY)
Amphetamines, Ur Screen: NOT DETECTED
Barbiturates, Ur Screen: NOT DETECTED
Benzodiazepine, Ur Scrn: NOT DETECTED
Cannabinoid 50 Ng, Ur ~~LOC~~: NOT DETECTED
Cocaine Metabolite,Ur ~~LOC~~: NOT DETECTED
MDMA (Ecstasy)Ur Screen: NOT DETECTED
Methadone Scn, Ur: NOT DETECTED
Opiate, Ur Screen: NOT DETECTED
Phencyclidine (PCP) Ur S: NOT DETECTED
Tricyclic, Ur Screen: NOT DETECTED

## 2024-05-19 LAB — MAGNESIUM: Magnesium: 1.6 mg/dL — ABNORMAL LOW (ref 1.7–2.4)

## 2024-05-19 MED ORDER — IOHEXOL 300 MG/ML  SOLN
100.0000 mL | Freq: Once | INTRAMUSCULAR | Status: AC | PRN
Start: 2024-05-19 — End: 2024-05-19
  Administered 2024-05-19: 100 mL via INTRAVENOUS

## 2024-05-19 MED ORDER — MORPHINE SULFATE (PF) 4 MG/ML IV SOLN
4.0000 mg | Freq: Once | INTRAVENOUS | Status: AC
  Administered 2024-05-19: 4 mg via INTRAVENOUS
  Filled 2024-05-19: qty 1

## 2024-05-19 MED ORDER — ENOXAPARIN SODIUM 40 MG/0.4ML IJ SOSY
40.0000 mg | PREFILLED_SYRINGE | INTRAMUSCULAR | Status: DC
  Filled 2024-05-19 (×2): qty 0.4

## 2024-05-19 MED ORDER — SODIUM CHLORIDE 0.9 % IV BOLUS
1000.0000 mL | Freq: Once | INTRAVENOUS | Status: AC
  Administered 2024-05-19: 1000 mL via INTRAVENOUS

## 2024-05-19 MED ORDER — THIAMINE MONONITRATE 100 MG PO TABS
100.0000 mg | ORAL_TABLET | Freq: Every day | ORAL | Status: DC
  Administered 2024-05-19 – 2024-05-21 (×3): 100 mg via ORAL
  Filled 2024-05-19 (×3): qty 1

## 2024-05-19 MED ORDER — CHLORDIAZEPOXIDE HCL 25 MG PO CAPS
25.0000 mg | ORAL_CAPSULE | Freq: Four times a day (QID) | ORAL | Status: DC
  Administered 2024-05-19 – 2024-05-21 (×9): 25 mg via ORAL
  Filled 2024-05-19 (×9): qty 1

## 2024-05-19 MED ORDER — ACETAMINOPHEN 325 MG PO TABS
650.0000 mg | ORAL_TABLET | Freq: Four times a day (QID) | ORAL | Status: DC | PRN
Start: 2024-05-19 — End: 2024-05-21

## 2024-05-19 MED ORDER — HYDROMORPHONE HCL 1 MG/ML IJ SOLN
1.0000 mg | INTRAMUSCULAR | Status: DC | PRN
  Administered 2024-05-19 (×2): 1 mg via INTRAVENOUS
  Filled 2024-05-19 (×2): qty 1

## 2024-05-19 MED ORDER — PANTOPRAZOLE SODIUM 40 MG IV SOLR
40.0000 mg | Freq: Once | INTRAVENOUS | Status: AC
  Administered 2024-05-19: 40 mg via INTRAVENOUS
  Filled 2024-05-19: qty 10

## 2024-05-19 MED ORDER — ADULT MULTIVITAMIN W/MINERALS CH
1.0000 | ORAL_TABLET | Freq: Every day | ORAL | Status: DC
  Administered 2024-05-19 – 2024-05-21 (×3): 1 via ORAL
  Filled 2024-05-19 (×3): qty 1

## 2024-05-19 MED ORDER — ACETAMINOPHEN 650 MG RE SUPP: 650.0000 mg | Freq: Four times a day (QID) | RECTAL | Status: DC | PRN

## 2024-05-19 MED ORDER — BICTEGRAVIR-EMTRICITAB-TENOFOV 50-200-25 MG PO TABS
1.0000 | ORAL_TABLET | Freq: Every day | ORAL | Status: DC
  Administered 2024-05-19 – 2024-05-21 (×3): 1 via ORAL
  Filled 2024-05-19 (×3): qty 1

## 2024-05-19 MED ORDER — FENTANYL CITRATE PF 50 MCG/ML IJ SOSY
50.0000 ug | PREFILLED_SYRINGE | Freq: Once | INTRAMUSCULAR | Status: AC
  Administered 2024-05-19: 50 ug via INTRAVENOUS
  Filled 2024-05-19: qty 1

## 2024-05-19 MED ORDER — LORAZEPAM 2 MG/ML IJ SOLN
1.0000 mg | INTRAMUSCULAR | Status: DC | PRN
  Administered 2024-05-19 – 2024-05-20 (×2): 2 mg via INTRAVENOUS
  Filled 2024-05-19 (×4): qty 1

## 2024-05-19 MED ORDER — MAGNESIUM SULFATE 4 GM/100ML IV SOLN
4.0000 g | Freq: Once | INTRAVENOUS | Status: AC
  Administered 2024-05-19: 4 g via INTRAVENOUS
  Filled 2024-05-19: qty 100

## 2024-05-19 MED ORDER — KETOROLAC TROMETHAMINE 15 MG/ML IJ SOLN
15.0000 mg | Freq: Four times a day (QID) | INTRAMUSCULAR | Status: DC | PRN
  Administered 2024-05-19 – 2024-05-21 (×3): 15 mg via INTRAVENOUS
  Filled 2024-05-19 (×3): qty 1

## 2024-05-19 MED ORDER — FOLIC ACID 1 MG PO TABS: 1.0000 mg | ORAL_TABLET | Freq: Every day | ORAL | Status: DC

## 2024-05-19 MED ORDER — LORAZEPAM 1 MG PO TABS
1.0000 mg | ORAL_TABLET | ORAL | Status: DC | PRN
  Administered 2024-05-20 – 2024-05-21 (×6): 2 mg via ORAL
  Filled 2024-05-19 (×6): qty 2

## 2024-05-19 MED ORDER — ONDANSETRON HCL 4 MG/2ML IJ SOLN
4.0000 mg | Freq: Four times a day (QID) | INTRAMUSCULAR | Status: DC | PRN
  Administered 2024-05-19: 4 mg via INTRAVENOUS
  Filled 2024-05-19: qty 2

## 2024-05-19 MED ORDER — SODIUM CHLORIDE 0.9 % IV SOLN
1.0000 g | Freq: Once | INTRAVENOUS | Status: AC
  Administered 2024-05-19: 1 g via INTRAVENOUS
  Filled 2024-05-19: qty 10

## 2024-05-19 MED ORDER — METRONIDAZOLE 500 MG/100ML IV SOLN
500.0000 mg | Freq: Once | INTRAVENOUS | Status: AC
  Administered 2024-05-19: 500 mg via INTRAVENOUS
  Filled 2024-05-19: qty 100

## 2024-05-19 MED ORDER — KCL IN DEXTROSE-NACL 20-5-0.9 MEQ/L-%-% IV SOLN
INTRAVENOUS | Status: AC
  Filled 2024-05-19 (×3): qty 1000

## 2024-05-19 MED ORDER — ONDANSETRON HCL 4 MG PO TABS: 4.0000 mg | ORAL_TABLET | Freq: Four times a day (QID) | ORAL | Status: DC | PRN

## 2024-05-19 MED ORDER — HYDROMORPHONE HCL 1 MG/ML IJ SOLN
1.0000 mg | INTRAMUSCULAR | Status: DC | PRN
  Administered 2024-05-19 – 2024-05-21 (×10): 1 mg via INTRAVENOUS
  Filled 2024-05-19 (×11): qty 1

## 2024-05-19 MED ORDER — THIAMINE HCL 100 MG/ML IJ SOLN: 100.0000 mg | Freq: Every day | INTRAMUSCULAR | Status: DC

## 2024-05-19 MED ORDER — ENSURE PLUS HIGH PROTEIN PO LIQD: 237.0000 mL | Freq: Two times a day (BID) | ORAL | Status: DC

## 2024-05-19 MED ORDER — SODIUM CHLORIDE 0.9 % IV SOLN: Freq: Once | INTRAVENOUS | Status: AC

## 2024-05-19 MED ORDER — SODIUM CHLORIDE 0.9% FLUSH
3.0000 mL | Freq: Two times a day (BID) | INTRAVENOUS | Status: DC
  Administered 2024-05-19 – 2024-05-21 (×5): 3 mL via INTRAVENOUS

## 2024-05-19 NOTE — ED Notes (Signed)
 After pain med given- pt sleeping, sats dropped to 91%- placed on 2L [place]
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
## 2024-05-19 NOTE — ED Notes (Signed)
 Admitting MD at bedside.

## 2024-05-19 NOTE — ED Provider Notes (Signed)
 [facility] Provider Note    Event Date/Time   First MD Initiated Contact with Patient 05/18/24 2326     (approximate)   History   Alcohol  Intoxication   HPI  Gary [name] is a 38 y.o. male   Past medical history of alcohol  induced pancreatitis, bipolar, HIV, PTSD and schizophrenia, seizures, who presents to the Emergency Department with alcohol  intoxication and reported heat exhaustion, states that he drinks daily and was drinking wine just before coming in reporting abdominal pain upper with some nausea similar to his pancreatitis bouts in the past.  He states that he lives outside was in the heat all day and that his boyfriend will not take him in and has nowhere to go.  When I meet with him he is sleepy and difficult to arouse but then wakes and moans and states that he has abdominal discomfort he nods his head when asked if he was drinking .  More interview was limited by his intoxicated state, labs from triage show alcohol  level of 496.   External Medical Documents Reviewed: Previous emergency department visits for alcohol  intoxication noted to have low blood pressures typically on those evaluations approximately 100 over 70s baseline      Physical Exam   Triage Vital Signs: ED Triage Vitals [05/18/24 1919]  Encounter Vitals Group     BP 96/69     Girls Systolic BP Percentile      Girls Diastolic BP Percentile      Boys Systolic BP Percentile      Boys Diastolic BP Percentile      Pulse Rate 85     Resp 15     Temp 98.1 F (36.7 C)     Temp Source Oral     SpO2 97 %     Weight      Height      Head Circumference      Peak Flow      Pain Score 8     Pain Loc      Pain Education      Exclude from Growth Chart     Most recent vital signs: Vitals:   05/19/24 0330 05/19/24 0400  BP: 107/73 107/73  Pulse: 75 75  Resp: 15   Temp:    SpO2: 96%     General: Awake, no distress. CV:  Good peripheral perfusion.  Resp:  Normal  effort.  Abd:  No distention.  Epigastric tenderness without rigidity or guarding. Other:   Blood pressure is low 90s over 60s and 80s over 50s on recheck.  Somnolent appears intoxicated wakes briefly to answer questions with 1-2 word answers and falls back asleep.  No hypoxemia.  Maintaining respirations  ED Results / Procedures / Treatments   Labs (all labs ordered are listed, but only abnormal results are displayed) Labs Reviewed  COMPREHENSIVE METABOLIC PANEL WITH GFR - Abnormal; Notable for the following components:      Result Value   Potassium 3.3 (*)    Glucose, Bld 122 (*)    Creatinine, Ser 0.56 (*)    Calcium  8.3 (*)    All other components within normal limits  ETHANOL - Abnormal; Notable for the following components:   Alcohol , Ethyl (B) 496 (*)    All other components within normal limits  CBC - Abnormal; Notable for the following components:   RBC 3.62 (*)    Hemoglobin 10.7 (*)    HCT 33.0 (*)    RDW  19.0 (*)    All other components within normal limits  LIPASE, BLOOD  URINE DRUG SCREEN, QUALITATIVE ([facility] ONLY)  LACTIC ACID, PLASMA  LACTIC ACID, PLASMA     I ordered and reviewed the above labs they are notable for alcohol  as elevated at 496, no leukocytosis.  RADIOLOGY I independently reviewed and interpreted CT abdomen pelvis and see inflammatory changes around the pancreas concerning for acute pancreatitis I also reviewed radiologist's formal read.   PROCEDURES:  Critical Care performed: Yes, see critical care procedure note(s)  .Critical Care  Performed by: Cyrena Mylar, MD Authorized by: Cyrena Mylar, MD   Critical care provider statement:    Critical care time (minutes):  30   Critical care was time spent personally by me on the following activities:  Development of treatment plan with patient or surrogate, discussions with consultants, evaluation of patient's response to treatment, examination of patient, ordering and review of laboratory studies,  ordering and review of radiographic studies, ordering and performing treatments and interventions, pulse oximetry, re-evaluation of patient's condition and review of old charts    MEDICATIONS ORDERED IN ED: Medications  metroNIDAZOLE  (FLAGYL ) IVPB 500 mg (has no administration in time range)  morphine  (PF) 4 MG/ML injection 4 mg (has no administration in time range)  LORazepam  (ATIVAN ) tablet 1-4 mg (has no administration in time range)    Or  LORazepam  (ATIVAN ) injection 1-4 mg (has no administration in time range)  thiamine  (VITAMIN B1) tablet 100 mg (has no administration in time range)    Or  thiamine  (VITAMIN B1) injection 100 mg (has no administration in time range)  folic acid  (FOLVITE ) tablet 1 mg (has no administration in time range)  multivitamin with minerals tablet 1 tablet (has no administration in time range)  ondansetron  (ZOFRAN -ODT) disintegrating tablet 4 mg (4 mg Oral Given 05/18/24 1923)  sodium chloride  0.9 % bolus 1,000 mL (0 mLs Intravenous Stopped 05/19/24 0123)  pantoprazole  (PROTONIX ) injection 40 mg (40 mg Intravenous Given 05/19/24 0049)  iohexol  (OMNIPAQUE ) 300 MG/ML solution 100 mL (100 mLs Intravenous Contrast Given 05/19/24 0248)  sodium chloride  0.9 % bolus 1,000 mL (0 mLs Intravenous Stopped 05/19/24 0217)  fentaNYL  (SUBLIMAZE ) injection 50 mcg (50 mcg Intravenous Given 05/19/24 0220)  0.9 %  sodium chloride  infusion ( Intravenous New Bag/Given 05/19/24 0335)  cefTRIAXone  (ROCEPHIN ) 1 g in sodium chloride  0.9 % 100 mL IVPB (0 g Intravenous Stopped 05/19/24 0403)    IMPRESSION / MDM / ASSESSMENT AND PLAN / ED COURSE  I reviewed the triage vital signs and the nursing notes.                                Patient's presentation is most consistent with acute presentation with potential threat to life or bodily function.  Differential diagnosis includes, but is not limited to, alcohol  intoxication, heat exposure, dehydration, pancreatitis, intra-abdominal  infection  The patient is on the cardiac monitor to evaluate for evidence of arrhythmia and/or significant heart rate changes.  MDM:     This a patient with alcohol  intoxication heat exposure and appears dehydrated with lower than typical blood pressures though he typically runs in 100 systolic today in the 80s to 90s.  I think likely due to his heat exhaustion and dehydration rather than infection.  He does have chronic pancreatitis and has pain in the same area as prior and is tender but does not have a peritoneal exam.  I  will get a CT abdomen and pelvis to see if there are any complications of his chronic pancreatitis that account for his low blood pressure and pain.  At this point I have lower suspicion of infection given his lack of fever or leukocytosis so I will defer antibiotics.    I will give fluids and recheck often his blood pressure.   Hold for sobriety reassessment as well.    -- Blood pressure much improved with fluids. CT scan of the abdomen pelvis shows early necrosis with no walled off collections for intervention at this time.  Patient continues to be resting comfortably after pain medications given.  He does not have fever or leukocytosis so I doubt active infection at this time but given the finding of early necrosis and his HIV, I will give antibiotics ceftriaxone  and Flagyl  for intra-abdominal coverage.  Check lactic.  Placed on CIWA.  Admit.      FINAL CLINICAL IMPRESSION(S) / ED DIAGNOSES   Final diagnoses:  Alcoholic intoxication without complication (HCC)  Heat exposure, initial encounter  Dehydration  Hypotension, unspecified hypotension type  Epigastric pain  Pancreatitis, necrotizing     Rx / DC Orders   ED Discharge Orders     None        Note:  This document was prepared using Dragon voice recognition software and may include unintentional dictation errors.    Cyrena Mylar, MD 05/19/24 [phone]

## 2024-05-20 DIAGNOSIS — E860 Dehydration: Secondary | ICD-10-CM

## 2024-05-20 DIAGNOSIS — B20 Human immunodeficiency virus [HIV] disease: Secondary | ICD-10-CM

## 2024-05-20 DIAGNOSIS — K8591 Acute pancreatitis with uninfected necrosis, unspecified: Secondary | ICD-10-CM

## 2024-05-20 DIAGNOSIS — K861 Other chronic pancreatitis: Secondary | ICD-10-CM

## 2024-05-20 DIAGNOSIS — K859 Acute pancreatitis without necrosis or infection, unspecified: Secondary | ICD-10-CM

## 2024-05-20 DIAGNOSIS — E876 Hypokalemia: Secondary | ICD-10-CM

## 2024-05-20 DIAGNOSIS — Z5900 Homelessness unspecified: Secondary | ICD-10-CM

## 2024-05-20 DIAGNOSIS — F10920 Alcohol use, unspecified with intoxication, uncomplicated: Secondary | ICD-10-CM

## 2024-05-20 LAB — COMPREHENSIVE METABOLIC PANEL WITH GFR
ALT: 23 U/L (ref 0–44)
AST: 32 U/L (ref 15–41)
Albumin: 3.7 g/dL (ref 3.5–5.0)
Alkaline Phosphatase: 58 U/L (ref 38–126)
Anion gap: 7 (ref 5–15)
BUN: 6 mg/dL (ref 6–20)
CO2: 28 mmol/L (ref 22–32)
Calcium: 8.8 mg/dL — ABNORMAL LOW (ref 8.9–10.3)
Chloride: 99 mmol/L (ref 98–111)
Creatinine, Ser: 0.45 mg/dL — ABNORMAL LOW (ref 0.61–1.24)
GFR, Estimated: >60 mL/min (ref >60)
Glucose, Bld: 109 mg/dL — ABNORMAL HIGH (ref 70–99)
Potassium: 3.8 mmol/L (ref 3.5–5.1)
Sodium: 134 mmol/L — ABNORMAL LOW (ref 135–145)
Total Bilirubin: 1 mg/dL (ref 0.0–1.2)
Total Protein: 6.7 g/dL (ref 6.5–8.1)

## 2024-05-20 LAB — CBC
HCT: 31.7 % — ABNORMAL LOW (ref 39.0–52.0)
Hemoglobin: 10.3 g/dL — ABNORMAL LOW (ref 13.0–17.0)
MCH: 29.2 pg (ref 26.0–34.0)
MCHC: 32.5 g/dL (ref 30.0–36.0)
MCV: 89.8 fL (ref 80.0–100.0)
Platelets: 138 K/uL — ABNORMAL LOW (ref 150–400)
RBC: 3.53 MIL/uL — ABNORMAL LOW (ref 4.22–5.81)
RDW: 17.6 % — ABNORMAL HIGH (ref 11.5–15.5)
WBC: 3.2 K/uL — ABNORMAL LOW (ref 4.0–10.5)
nRBC: 0 % (ref 0.0–0.2)

## 2024-05-20 LAB — MAGNESIUM: Magnesium: 1.9 mg/dL (ref 1.7–2.4)

## 2024-05-20 MED ORDER — NICOTINE 21 MG/24HR TD PT24
21.0000 mg | MEDICATED_PATCH | Freq: Every day | TRANSDERMAL | Status: DC
  Administered 2024-05-20 – 2024-05-21 (×2): 21 mg via TRANSDERMAL
  Filled 2024-05-20 (×2): qty 1

## 2024-05-20 MED ORDER — FOLIC ACID 1 MG PO TABS
1.0000 mg | ORAL_TABLET | Freq: Every day | ORAL | Status: DC
  Administered 2024-05-20 – 2024-05-21 (×2): 1 mg via ORAL
  Filled 2024-05-20 (×2): qty 1

## 2024-05-20 NOTE — Assessment & Plan Note (Signed)
Resolved. -Continue to monitor and replete as needed 
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
## 2024-05-20 NOTE — Assessment & Plan Note (Signed)
 Significantly elevated alcohol  level on admission, at 496.  History of withdrawal. Counseling was provided CIWA score of 7 today -Continue with CIWA protocol CT demonstrates mild diffuse fatty infiltration of the liver without overt signs of cirrhosis otherwise

## 2024-05-20 NOTE — Hospital Course (Addendum)
 Gary [name] is a 38 y.o. male with medical history significant of HIV on antiretrovirals followed by the [facility] ID clinic, alcoholism and depression.  Presented via EMS to the ED after reporting acute intoxication of alcohol , and abdominal pain.   Patient is homeless and admits drinking 5-6 bottles of wine and a case of beer daily.  Has history of significant withdrawal in the past.  On presentation patient with softer blood pressure and was given 2 L of normal saline bolus.  Alcohol  level was 496,  Lipase was normal but CT of abdomen and pelvis did revealed signs of heterogenous enlargement of the pancreatic head and uncinate process with stranding in the surrounding peri pancreatic fat consistent with acute pancreatitis.  UDS negative.   Prior imaging an MRI of abdomen which was done in April reviewed. Patient has this concerning opacity in pancreatic head which seems progressing and also concern of some necrosis. Liver enzymes otherwise normal.  CA 19-9 was ordered.  7/12: Vital stable, labs with mild hyponatremia and platelets of 138, all cell lines decreased so likely some dilutional effect magnesium  improved to 1.9.  CIWA score of 7 CA 19-9 pending.  Starting on clear liquid diet, he tolerated that well so advancing to full liquid now.  7/13: Remained hemodynamically stable, CIWA score of 4 this morning.  He wants to go home and does not want to spend another night.  Case was discussed with his brother Chyrl as he was going to his place after discharge.  We requested someone from family to keep an eye on him as he is being discharged with a Librium  taper.  Patient was instructed not to combine alcohol  and Librium  and if he decided to restart his alcohol  he should not be taking any Librium .  Counseling was again provided for alcohol  cessation and the damage to his pancreas.  Patient promised that he will not restart drinking.  Patient also need a pancreatic designated MRI in 3 to 4 weeks  which need to be done by PCP for further evaluation of a concerning lesion.  CA 19-9 results are still pending and PCP can follow-up.  Patient will continue with his home medications and need to have a close follow-up with his providers for further assistance.

## 2024-05-20 NOTE — Assessment & Plan Note (Signed)
 Likely secondary to ongoing heavy alcohol  use. Pain improved, no more nausea or vomiting so he was started on diet-we will advance as tolerated.  Patient has prior imaging and recent concern of small progression of a heterogeneous area in pancreatic head, likely due to persistent pancreatitis but need to rule out malignancy as he is high risk.  - CA 19-9 was ordered -Patient will need a pancreatic dedicated MRI in 3 to 4 weeks. - Continue with supportive care

## 2024-05-20 NOTE — TOC Initial Note (Signed)
 Transition of Care [facility]) - Initial/Assessment Note    Patient Details  Name: Gary [name] MRN: [id number] Date of Birth: February 20, 1986  [facility]) CM/SW Contact:    [place] L Aleighna Wojtas, LCSW Phone Number: 05/20/2024, 5:10 PM  Clinical Narrative:                  CSW met with patient at bedside. Brief assessment completed. Patient has Medicaid with [employer]. Patient was provided community resources for housing, food, substance abuse treatment etc. Resources also uploaded to the AVS.   Patient advised that he does not have an income. Patient stated that he can bounce around between friends and family. He stated that after discharge he can spend a few days with his brother. CWS attempted to contact the brother but the call went straight to voicemail. CSW also attempted to contact the patient mother to no avail; the number is disconnected.   TOC should be consulted if there are any TOC needs. Patient advised that he should start calling shelters and if interested the inpatient substance abuse resources.         Patient Goals and CMS Choice            Expected Discharge Plan and Services                                              Prior Living Arrangements/Services                       Activities of Daily Living   ADL Screening (condition at time of admission) Independently performs ADLs?: Yes (appropriate for developmental age) Is the patient deaf or have difficulty hearing?: No Does the patient have difficulty seeing, even when wearing glasses/contacts?: No Does the patient have difficulty concentrating, remembering, or making decisions?: No  Permission Sought/Granted                  Emotional Assessment              Admission diagnosis:  Dehydration [E86.0] Epigastric pain [R10.13] Pancreatitis, necrotizing [K85.91] Alcoholic intoxication without complication (HCC) [F10.920] Heat exposure, initial encounter  [T67.9XXA] Hypotension, unspecified hypotension type [I95.9] Acute on chronic pancreatitis (HCC) [K85.90, K86.1] Patient Active Problem List   Diagnosis Date Noted   Dehydration 05/19/2024   Heat exposure 05/19/2024   Epigastric pain 05/09/2024   SIRS (systemic inflammatory response syndrome) (HCC) 05/09/2024   Bipolar 2 disorder (HCC) 05/09/2024   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   Brief psychotic disorder (HCC) 04/20/2024   Hypotension 03/31/2024   High risk for readmission 03/01/2024   Alcohol  withdrawal (HCC) 02/25/2024   Severe protein-calorie malnutrition (HCC) 02/25/2024   Alcohol  induced acute pancreatitis 02/14/2024   Pancreatitis 02/13/2024   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/2024   Ileus (HCC) 12/25/2023   PTSD (post-traumatic stress disorder) 12/25/2023   Anxiety state 12/20/2023   Pancreatitis, necrotizing 12/17/2023   Tobacco use disorder 12/17/2023   Acute alcoholic pancreatitis 12/10/2023   Acute on chronic anemia 12/05/2023   Seizure (HCC) 11/19/2023   Gastritis and duodenitis 11/19/2023   Chronic pancreatitis (HCC) 10/16/2023   Acute on chronic pancreatitis (HCC) 10/16/2023   Alcoholic fatty liver 09/25/2023   Hypomagnesemia 12/24/2022   Seizure disorder (HCC) 12/17/2022   Pancytopenia (HCC) 12/14/2022   Alcohol -induced pancreatitis 12/14/2022   Delirium tremens (HCC) 10/19/2022  Alcohol  dependence with withdrawal (HCC) 10/16/2022   Homelessness 05/28/2022   Hypokalemia 01/22/2022   Constipation 07/30/2021   Malingering 05/15/2020   Alcoholic intoxication without complication (HCC) 07/02/2019   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 [place] [address] Phone: [phone] Fax: [phone]  [facility]) [phone] - [place], [place] - [address] AT [facility] [address] [place] [address] Phone: [phone] Fax: [phone]  [id number] [place], [place] - [address] AT [facility] OF [address] [place] [address] Phone: [phone] Fax: [phone]  [facility] - [facility] [address] [place] [address] Phone: [phone] Fax: [phone]  [id number] [place], [place] - [address] AT [facility] OF [address] [place] [address] Phone: [phone] Fax: [phone]     Social Drivers of Health (SDOH) Social History: SDOH Screenings   Food Insecurity: Patient Declined (04/18/2024)  Recent Concern: Food Insecurity - Food Insecurity Present (02/19/2024)  Housing: Patient Declined (04/18/2024)  Recent Concern: Housing - High Risk (02/19/2024)  Transportation Needs: Patient Declined (04/18/2024)  Recent Concern: Transportation Needs - Unmet Transportation Needs (02/19/2024)  Utilities: Patient Declined (04/18/2024)  Alcohol  Screen: High Risk (04/17/2024)  Depression (PHQ2-9): Medium Risk (01/28/2024)  Financial Resource Strain: Patient Declined (10/11/2023)  Social Connections: Patient Declined (04/18/2024)  Recent Concern: Social Connections - Socially Isolated (02/03/2024)  Stress: No Stress Concern Present (06/30/2022)   Received from [facility]  Recent Concern: Stress - Stress Concern Present (06/25/2022)   Received from [facility]  Tobacco Use: High Risk (05/18/2024)   SDOH Interventions:     Readmission Risk Interventions    02/14/2024    3:10 PM 12/15/2023   11:23 AM 11/29/2023   10:27 AM   Readmission Risk Prevention Plan  Transportation Screening Complete Complete Complete  Medication Review (RN Care Manager) Complete Complete Complete  PCP or Specialist appointment within 3-5 days of discharge Patient refused Complete   HRI or Home Care Consult Complete Complete Complete  SW Recovery Care/Counseling Consult Complete Complete Complete  Palliative Care Screening Not Applicable Not Applicable Not Applicable  Skilled Nursing Facility Not Applicable Not Applicable Not Applicable

## 2024-05-20 NOTE — Assessment & Plan Note (Signed)
 TOC consult. Per patient he can talk with some family member to go back with them.

## 2024-05-20 NOTE — Assessment & Plan Note (Signed)
 Improved. Continue to monitor.

## 2024-05-21 ENCOUNTER — Inpatient Hospital Stay (HOSPITAL_COMMUNITY)
Admission: EM | Admit: 2024-05-21 | Discharge: 2024-05-26 | Disposition: A | Discharge status: Home / Self Care | Payer: MEDICAID | Source: Home / Self Care | Attending: Internal Medicine | Admitting: Internal Medicine

## 2024-05-21 ENCOUNTER — Other Ambulatory Visit: Payer: Self-pay

## 2024-05-21 ENCOUNTER — Inpatient Hospital Stay: Payer: MEDICAID

## 2024-05-21 DIAGNOSIS — K852 Alcohol induced acute pancreatitis without necrosis or infection: Secondary | ICD-10-CM | POA: Diagnosis present

## 2024-05-21 DIAGNOSIS — Z79899 Other long term (current) drug therapy: Secondary | ICD-10-CM

## 2024-05-21 DIAGNOSIS — K859 Acute pancreatitis without necrosis or infection, unspecified: Secondary | ICD-10-CM | POA: Diagnosis present

## 2024-05-21 DIAGNOSIS — F17210 Nicotine dependence, cigarettes, uncomplicated: Secondary | ICD-10-CM | POA: Diagnosis present

## 2024-05-21 DIAGNOSIS — R112 Nausea with vomiting, unspecified: Secondary | ICD-10-CM | POA: Diagnosis present

## 2024-05-21 DIAGNOSIS — F17200 Nicotine dependence, unspecified, uncomplicated: Secondary | ICD-10-CM | POA: Diagnosis present

## 2024-05-21 DIAGNOSIS — K760 Fatty (change of) liver, not elsewhere classified: Secondary | ICD-10-CM | POA: Diagnosis present

## 2024-05-21 DIAGNOSIS — Z6372 Alcoholism and drug addiction in family: Secondary | ICD-10-CM

## 2024-05-21 DIAGNOSIS — Z5900 Homelessness unspecified: Secondary | ICD-10-CM

## 2024-05-21 DIAGNOSIS — Z8249 Family history of ischemic heart disease and other diseases of the circulatory system: Secondary | ICD-10-CM

## 2024-05-21 DIAGNOSIS — F1910 Other psychoactive substance abuse, uncomplicated: Secondary | ICD-10-CM | POA: Diagnosis present

## 2024-05-21 DIAGNOSIS — F419 Anxiety disorder, unspecified: Secondary | ICD-10-CM | POA: Diagnosis present

## 2024-05-21 DIAGNOSIS — Y905 Blood alcohol level of 100-119 mg/100 ml: Secondary | ICD-10-CM | POA: Diagnosis present

## 2024-05-21 DIAGNOSIS — E871 Hypo-osmolality and hyponatremia: Secondary | ICD-10-CM | POA: Diagnosis present

## 2024-05-21 DIAGNOSIS — K219 Gastro-esophageal reflux disease without esophagitis: Secondary | ICD-10-CM | POA: Diagnosis present

## 2024-05-21 DIAGNOSIS — F418 Other specified anxiety disorders: Secondary | ICD-10-CM | POA: Diagnosis present

## 2024-05-21 DIAGNOSIS — F10939 Alcohol use, unspecified with withdrawal, unspecified: Principal | ICD-10-CM | POA: Diagnosis present

## 2024-05-21 DIAGNOSIS — Z681 Body mass index (BMI) 19 or less, adult: Secondary | ICD-10-CM

## 2024-05-21 DIAGNOSIS — Z21 Asymptomatic human immunodeficiency virus [HIV] infection status: Secondary | ICD-10-CM | POA: Diagnosis present

## 2024-05-21 DIAGNOSIS — F3181 Bipolar II disorder: Secondary | ICD-10-CM | POA: Diagnosis present

## 2024-05-21 DIAGNOSIS — F10920 Alcohol use, unspecified with intoxication, uncomplicated: Secondary | ICD-10-CM

## 2024-05-21 DIAGNOSIS — E43 Unspecified severe protein-calorie malnutrition: Secondary | ICD-10-CM | POA: Diagnosis present

## 2024-05-21 DIAGNOSIS — F4310 Post-traumatic stress disorder, unspecified: Secondary | ICD-10-CM | POA: Diagnosis present

## 2024-05-21 DIAGNOSIS — E876 Hypokalemia: Secondary | ICD-10-CM

## 2024-05-21 DIAGNOSIS — Z811 Family history of alcohol abuse and dependence: Secondary | ICD-10-CM

## 2024-05-21 DIAGNOSIS — B20 Human immunodeficiency virus [HIV] disease: Secondary | ICD-10-CM | POA: Diagnosis present

## 2024-05-21 DIAGNOSIS — K860 Alcohol-induced chronic pancreatitis: Secondary | ICD-10-CM | POA: Diagnosis present

## 2024-05-21 DIAGNOSIS — Z888 Allergy status to other drugs, medicaments and biological substances status: Secondary | ICD-10-CM

## 2024-05-21 DIAGNOSIS — K861 Other chronic pancreatitis: Secondary | ICD-10-CM

## 2024-05-21 DIAGNOSIS — E860 Dehydration: Secondary | ICD-10-CM

## 2024-05-21 DIAGNOSIS — F10230 Alcohol dependence with withdrawal, uncomplicated: Secondary | ICD-10-CM | POA: Diagnosis present

## 2024-05-21 DIAGNOSIS — K869 Disease of pancreas, unspecified: Secondary | ICD-10-CM | POA: Diagnosis present

## 2024-05-21 DIAGNOSIS — R1013 Epigastric pain: Secondary | ICD-10-CM

## 2024-05-21 DIAGNOSIS — I959 Hypotension, unspecified: Secondary | ICD-10-CM

## 2024-05-21 DIAGNOSIS — F10930 Alcohol use, unspecified with withdrawal, uncomplicated: Secondary | ICD-10-CM

## 2024-05-21 DIAGNOSIS — F319 Bipolar disorder, unspecified: Secondary | ICD-10-CM | POA: Diagnosis present

## 2024-05-21 DIAGNOSIS — F1010 Alcohol abuse, uncomplicated: Secondary | ICD-10-CM | POA: Diagnosis present

## 2024-05-21 LAB — COMPREHENSIVE METABOLIC PANEL WITH GFR
ALT: 24 U/L (ref 0–44)
AST: 43 U/L — ABNORMAL HIGH (ref 15–41)
Albumin: 4.5 g/dL (ref 3.5–5.0)
Alkaline Phosphatase: 65 U/L (ref 38–126)
Anion gap: 14 (ref 5–15)
BUN: 13 mg/dL (ref 6–20)
CO2: 23 mmol/L (ref 22–32)
Calcium: 10.6 mg/dL — ABNORMAL HIGH (ref 8.9–10.3)
Chloride: 95 mmol/L — ABNORMAL LOW (ref 98–111)
Creatinine, Ser: 1.24 mg/dL (ref 0.61–1.24)
GFR, Estimated: >60 mL/min
Glucose, Bld: 104 mg/dL — ABNORMAL HIGH (ref 70–99)
Potassium: 4.2 mmol/L (ref 3.5–5.1)
Sodium: 132 mmol/L — ABNORMAL LOW (ref 135–145)
Total Bilirubin: 0.5 mg/dL (ref 0.0–1.2)
Total Protein: 8.4 g/dL — ABNORMAL HIGH (ref 6.5–8.1)

## 2024-05-21 LAB — CBC
HCT: 33.2 % — ABNORMAL LOW (ref 39.0–52.0)
HCT: 35.5 % — ABNORMAL LOW (ref 39.0–52.0)
Hemoglobin: 10.8 g/dL — ABNORMAL LOW (ref 13.0–17.0)
Hemoglobin: 11.6 g/dL — ABNORMAL LOW (ref 13.0–17.0)
MCH: 29.3 pg (ref 26.0–34.0)
MCH: 29 pg (ref 26.0–34.0)
MCHC: 32.5 g/dL (ref 30.0–36.0)
MCHC: 32.7 g/dL (ref 30.0–36.0)
MCV: 90.2 fL (ref 80.0–100.0)
MCV: 88.8 fL (ref 80.0–100.0)
Platelets: 143 K/uL — ABNORMAL LOW (ref 150–400)
Platelets: 189 10*3/uL (ref 150–400)
RBC: 3.68 MIL/uL — ABNORMAL LOW (ref 4.22–5.81)
RBC: 4 MIL/uL — ABNORMAL LOW (ref 4.22–5.81)
RDW: 17.6 % — ABNORMAL HIGH (ref 11.5–15.5)
RDW: 17.6 % — ABNORMAL HIGH (ref 11.5–15.5)
WBC: 2.4 K/uL — ABNORMAL LOW (ref 4.0–10.5)
WBC: 6.7 10*3/uL (ref 4.0–10.5)
nRBC: 0 % (ref 0.0–0.2)
nRBC: 0.4 % — ABNORMAL HIGH (ref 0.0–0.2)

## 2024-05-21 LAB — BASIC METABOLIC PANEL WITH GFR
Anion gap: 10 (ref 5–15)
BUN: 6 mg/dL (ref 6–20)
CO2: 24 mmol/L (ref 22–32)
Calcium: 9.1 mg/dL (ref 8.9–10.3)
Chloride: 98 mmol/L (ref 98–111)
Creatinine, Ser: 0.51 mg/dL — ABNORMAL LOW (ref 0.61–1.24)
GFR, Estimated: >60 mL/min (ref >60)
Glucose, Bld: 84 mg/dL (ref 70–99)
Potassium: 4 mmol/L (ref 3.5–5.1)
Sodium: 132 mmol/L — ABNORMAL LOW (ref 135–145)

## 2024-05-21 LAB — LIPASE, BLOOD: Lipase: 184 U/L — ABNORMAL HIGH (ref 11–51)

## 2024-05-21 LAB — ETHANOL: Alcohol, Ethyl (B): 106 mg/dL — ABNORMAL HIGH

## 2024-05-21 MED ORDER — SODIUM CHLORIDE 0.9 % IV BOLUS
1000.0000 mL | Freq: Once | INTRAVENOUS | Status: AC
  Administered 2024-05-22: 1000 mL via INTRAVENOUS

## 2024-05-21 MED ORDER — ACETAMINOPHEN 325 MG PO TABS: 650.0000 mg | ORAL_TABLET | Freq: Four times a day (QID) | ORAL | Status: DC | PRN

## 2024-05-21 MED ORDER — PHENOBARBITAL SODIUM 65 MG/ML IJ SOLN
65.0000 mg | Freq: Three times a day (TID) | INTRAMUSCULAR | Status: DC
  Administered 2024-05-24: 65 mg via INTRAVENOUS
  Filled 2024-05-21: qty 1

## 2024-05-21 MED ORDER — PANCRELIPASE (LIP-PROT-AMYL) 24000-76000 UNITS PO CPEP: 24000.0000 [IU] | ORAL_CAPSULE | Freq: Three times a day (TID) | ORAL | 0 refills | Status: DC

## 2024-05-21 MED ORDER — THIAMINE HCL 100 MG/ML IJ SOLN
100.0000 mg | Freq: Every day | INTRAMUSCULAR | Status: DC
  Administered 2024-05-25 – 2024-05-26 (×2): 100 mg via INTRAVENOUS
  Filled 2024-05-21 (×2): qty 2

## 2024-05-21 MED ORDER — ONDANSETRON HCL 4 MG/2ML IJ SOLN
4.0000 mg | Freq: Once | INTRAMUSCULAR | Status: DC | PRN
Start: 2024-05-21 — End: 2024-05-21

## 2024-05-21 MED ORDER — ONDANSETRON HCL 4 MG/2ML IJ SOLN
4.0000 mg | Freq: Three times a day (TID) | INTRAMUSCULAR | Status: DC | PRN
  Administered 2024-05-24: 4 mg via INTRAVENOUS
  Filled 2024-05-21: qty 2

## 2024-05-21 MED ORDER — PHENOBARBITAL SODIUM 65 MG/ML IJ SOLN: 32.5000 mg | Freq: Three times a day (TID) | INTRAMUSCULAR | Status: DC

## 2024-05-21 MED ORDER — THIAMINE MONONITRATE 100 MG PO TABS
100.0000 mg | ORAL_TABLET | Freq: Every day | ORAL | Status: DC
  Administered 2024-05-22 – 2024-05-24 (×3): 100 mg via ORAL
  Filled 2024-05-21 (×3): qty 1

## 2024-05-21 MED ORDER — PHENOBARBITAL SODIUM 130 MG/ML IJ SOLN
97.5000 mg | Freq: Three times a day (TID) | INTRAMUSCULAR | Status: AC
  Administered 2024-05-22 – 2024-05-24 (×6): 97.5 mg via INTRAVENOUS
  Filled 2024-05-21 (×6): qty 1

## 2024-05-21 MED ORDER — PHENOBARBITAL SODIUM 130 MG/ML IJ SOLN
130.0000 mg | Freq: Once | INTRAMUSCULAR | Status: AC
  Administered 2024-05-22: 130 mg via INTRAVENOUS
  Filled 2024-05-21: qty 1

## 2024-05-21 MED ORDER — ADULT MULTIVITAMIN W/MINERALS CH
1.0000 | ORAL_TABLET | Freq: Every day | ORAL | Status: DC
  Administered 2024-05-22 – 2024-05-25 (×5): 1 via ORAL
  Filled 2024-05-21 (×5): qty 1

## 2024-05-21 MED ORDER — IOHEXOL 300 MG/ML  SOLN
100.0000 mL | Freq: Once | INTRAMUSCULAR | Status: AC | PRN
  Administered 2024-05-22: 100 mL via INTRAVENOUS

## 2024-05-21 MED ORDER — LORAZEPAM 1 MG PO TABS
1.0000 mg | ORAL_TABLET | ORAL | Status: AC | PRN
  Administered 2024-05-22: 1 mg via ORAL
  Administered 2024-05-22: 2 mg via ORAL
  Administered 2024-05-23 – 2024-05-24 (×4): 1 mg via ORAL
  Filled 2024-05-21 (×2): qty 1
  Filled 2024-05-21: qty 2
  Filled 2024-05-21 (×2): qty 1
  Filled 2024-05-21: qty 2
  Filled 2024-05-21: qty 1

## 2024-05-21 MED ORDER — SODIUM CHLORIDE 0.9 % IV SOLN: INTRAVENOUS | Status: AC

## 2024-05-21 MED ORDER — NICOTINE 21 MG/24HR TD PT24
21.0000 mg | MEDICATED_PATCH | Freq: Every day | TRANSDERMAL | Status: DC
  Administered 2024-05-22 – 2024-05-24 (×2): 21 mg via TRANSDERMAL
  Filled 2024-05-21 (×5): qty 1

## 2024-05-21 MED ORDER — PANTOPRAZOLE SODIUM 40 MG PO TBEC
40.0000 mg | DELAYED_RELEASE_TABLET | Freq: Every day | ORAL | Status: DC
  Administered 2024-05-22 – 2024-05-25 (×4): 40 mg via ORAL
  Filled 2024-05-21 (×4): qty 1

## 2024-05-21 MED ORDER — HYDROMORPHONE HCL 1 MG/ML IJ SOLN
1.0000 mg | Freq: Once | INTRAMUSCULAR | Status: AC
  Administered 2024-05-21: 1 mg via INTRAVENOUS
  Filled 2024-05-21: qty 1

## 2024-05-21 MED ORDER — CHLORDIAZEPOXIDE HCL 25 MG PO CAPS: 25.0000 mg | ORAL_CAPSULE | Freq: Three times a day (TID) | ORAL | 0 refills | Status: DC

## 2024-05-21 MED ORDER — OXYCODONE HCL 5 MG PO TABS
5.0000 mg | ORAL_TABLET | Freq: Four times a day (QID) | ORAL | Status: DC | PRN
  Administered 2024-05-23 – 2024-05-26 (×5): 5 mg via ORAL
  Filled 2024-05-21 (×6): qty 1

## 2024-05-21 MED ORDER — LORAZEPAM 2 MG/ML IJ SOLN: 1.0000 mg | INTRAMUSCULAR | Status: AC | PRN

## 2024-05-21 MED ORDER — PANCRELIPASE (LIP-PROT-AMYL) 36000-114000 UNITS PO CPEP
36000.0000 [IU] | ORAL_CAPSULE | Freq: Three times a day (TID) | ORAL | Status: DC
  Filled 2024-05-21 (×2): qty 1

## 2024-05-21 MED ORDER — FOLIC ACID 1 MG PO TABS
1.0000 mg | ORAL_TABLET | Freq: Every day | ORAL | Status: DC
  Administered 2024-05-22 – 2024-05-26 (×5): 1 mg via ORAL
  Filled 2024-05-21 (×5): qty 1

## 2024-05-21 MED ORDER — HYDROMORPHONE HCL 1 MG/ML IJ SOLN
1.0000 mg | INTRAMUSCULAR | Status: DC | PRN
  Administered 2024-05-22 – 2024-05-23 (×7): 1 mg via INTRAVENOUS
  Filled 2024-05-21 (×7): qty 1

## 2024-05-21 NOTE — Plan of Care (Signed)
  Problem: Education: Goal: Knowledge of General Education information will improve Description: Including pain rating scale, medication(s)/side effects and non-pharmacologic comfort measures Outcome: Progressing   Problem: Clinical Measurements: Goal: Ability to maintain clinical measurements within normal limits will improve Outcome: Progressing Goal: Diagnostic test results will improve Outcome: Progressing   Problem: Nutrition: Goal: Adequate nutrition will be maintained Outcome: Progressing   Problem: Coping: Goal: Level of anxiety will decrease Outcome: Progressing   Problem: Pain Managment: Goal: General experience of comfort will improve and/or be controlled Outcome: Progressing   Problem: Safety: Goal: Ability to remain free from injury will improve Outcome: Progressing

## 2024-05-21 NOTE — ED Triage Notes (Signed)
 Pt to ed from home via POV for alcohol  withdrawal. Pt was seen 7/10 for same and was DC today. Pt is back for same thing. Pt states My pain is unbearable. I am back bc they didn't treat my pain right upstairs. Pt has chronic abd pain. Pt drinks 12 pack of beer a day and has only had ONE beer today. Pt is caox4, actively vomiting in triage and is very shaky,

## 2024-05-21 NOTE — ED Notes (Signed)
 Pt states he is having abdominal pain with N/V and is having alcohol  withdrawals. Pt states he tried to drink today but was unable to drink his usual amount due to vomiting shortly after.   Pt states he drinks about a 12 pack a day and is now drinking liquor as opposed to wine that he was drinking before.

## 2024-05-21 NOTE — Discharge Summary (Signed)
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 Act, 21 U.S.C.section 360bbb-3(b)(1), unless the authorization is terminated  or revoked sooner.       Influenza A by PCR NEGATIVE NEGATIVE Final   Influenza B by PCR NEGATIVE NEGATIVE Final    Comment: (NOTE) The Xpert Xpress SARS-CoV-2/FLU/RSV plus assay is intended as an aid in the diagnosis of influenza from Nasopharyngeal swab specimens and should not be used as a sole basis for treatment. Nasal washings and aspirates are unacceptable for Xpert Xpress SARS-CoV-2/FLU/RSV testing.  Fact Sheet for Patients: BloggerCourse.com  Fact Sheet for Healthcare Providers: SeriousBroker.it  This test is not yet approved or cleared by the [place]  FDA and has been authorized for detection and/or diagnosis of SARS-CoV-2 by FDA under an Emergency Use Authorization (EUA). This EUA will remain in effect (meaning this test can be used) for the duration of the COVID-19 declaration under Section 564(b)(1) of the Act, 21 U.S.C. section 360bbb-3(b)(1),  unless the authorization is terminated or revoked.     Resp Syncytial Virus by PCR NEGATIVE NEGATIVE Final    Comment: (NOTE) Fact Sheet for Patients: BloggerCourse.com  Fact Sheet for Healthcare Providers: SeriousBroker.it  This test is not yet approved or cleared by the [place]  FDA and has been authorized for detection and/or diagnosis of SARS-CoV-2 by FDA under an Emergency Use Authorization (EUA). This EUA will remain in effect (meaning this test can be used) for the duration of the COVID-19 declaration under Section 564(b)(1) of the Act, 21 U.S.C. section 360bbb-3(b)(1), unless the authorization is terminated or revoked.  Performed at [facility], [address] [place], [place] [address]   C Difficile Quick Screen w PCR reflex     Status: None   Collection Time: 03/01/24  8:12 AM   Specimen: STOOL  Result Value Ref Range Status   C Diff antigen NEGATIVE NEGATIVE Final   C Diff toxin NEGATIVE NEGATIVE Final   C Diff interpretation No C. difficile detected.  Final    Comment: Performed at [facility], [address] [place], [place] [address]  Gastrointestinal Panel by PCR , Stool     Status: None   Collection Time: 03/01/24  8:12 AM   Specimen: STOOL  Result Value Ref Range Status   Campylobacter species NOT DETECTED NOT DETECTED Final   Plesimonas shigelloides NOT DETECTED NOT DETECTED Final   Salmonella species NOT DETECTED NOT DETECTED Final   Yersinia enterocolitica NOT DETECTED NOT DETECTED Final   Vibrio species NOT DETECTED NOT DETECTED Final   Vibrio cholerae  NOT DETECTED NOT DETECTED Final   Enteroaggregative E coli (EAEC) NOT DETECTED NOT DETECTED Final   Enteropathogenic E coli (EPEC) NOT DETECTED NOT DETECTED Final   Enterotoxigenic E coli (ETEC) NOT DETECTED NOT DETECTED Final   Shiga like toxin producing E coli (STEC) NOT DETECTED NOT DETECTED Final   Shigella/Enteroinvasive  E coli (EIEC) NOT DETECTED NOT DETECTED Final   Cryptosporidium NOT DETECTED NOT DETECTED Final   Cyclospora cayetanensis NOT DETECTED NOT DETECTED Final   Entamoeba histolytica NOT DETECTED NOT DETECTED Final   Giardia lamblia NOT DETECTED NOT DETECTED Final   Adenovirus F40/41 NOT DETECTED NOT DETECTED Final   Astrovirus NOT DETECTED NOT DETECTED Final   Norovirus GI/GII NOT DETECTED NOT DETECTED Final   Rotavirus A NOT DETECTED NOT DETECTED Final   Sapovirus (I, II, IV, and V) NOT DETECTED NOT DETECTED Final    Comment: Performed at [facility], [address] [place], [place] [address]  MRSA Next Gen by PCR, Nasal     Status: None   Collection Time: 03/02/24  5:32 AM   Specimen: Nasal Mucosa; Nasal Swab  Result Value Ref Range Status   MRSA by PCR Next Gen NOT DETECTED NOT DETECTED Final    Comment: (NOTE) The GeneXpert MRSA Assay (FDA approved for NASAL specimens only), is one component of a comprehensive MRSA colonization surveillance program. It is not intended to diagnose MRSA infection nor to guide or monitor treatment for MRSA infections. Test performance is not FDA approved in patients less than 20 years old. Performed at [facility], [address] [place], [place] [address]     Labs: CBC: Recent Labs  Lab 05/16/24 1218 05/18/24 1922 05/20/24 0351 05/21/24 0902  WBC 3.6* 4.0 3.2* 2.4*  HGB 10.1* 10.7* 10.3* 10.8*  HCT 31.8* 33.0* 31.7* 33.2*  MCV 91.9 91.2 89.8 90.2  PLT 191 273 138* 143*   Basic Metabolic Panel: Recent Labs  Lab 05/16/24 1218 05/18/24 1922 05/19/24 0659 05/20/24 0351 05/21/24 0902  NA 144 142 140 134* 132*  K 3.9 3.3* 3.9 3.8 4.0  CL 108 106 107 99 98  CO2 27 23 25 28 24   GLUCOSE 95 122* 87 109* 84  BUN 7 6 6 6 6   CREATININE 0.56* 0.56* 0.44* 0.45* 0.51*  CALCIUM  8.3* 8.3* 7.3* 8.8* 9.1  MG  --   --  1.6* 1.9  --    Liver Function Tests: Recent Labs  Lab 05/16/24 1218 05/18/24 1922 05/20/24 0351   AST 28 38 32  ALT 27 23 23   ALKPHOS 44 47 58  BILITOT 0.6 0.4 1.0  PROT 6.6 7.3 6.7  ALBUMIN 3.6 3.8 3.7   CBG: No results for input(s): GLUCAP in the last 168 hours.  Discharge time spent: greater than 30 minutes.  This record has been created using Conservation officer, historic buildings. Errors have been sought and corrected,but may not always be located. Such creation errors do not reflect on the standard of care.   Signed: Amaryllis Dare, MD [facility] 05/21/2024

## 2024-05-21 NOTE — ED Notes (Signed)
 RN unable to get IV. Pt had to have a midline this last visit.

## 2024-05-21 NOTE — ED Provider Notes (Signed)
 [name] Provider Note   Event Date/Time   First MD Initiated Contact with Patient 05/21/24 2134     (approximate) History  Alcohol  Intoxication  HPI Gary [name] is a 38 y.o. 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  Patient was recently discharged from the hospital yesterday and started drinking beer when he got home.  Patient states that soon afterwards he developed the above symptoms.  Patient denies any exacerbating or relieving factors. ROS: Patient currently denies any vision changes, tinnitus, difficulty speaking, facial droop, sore throat, chest pain, shortness of breath, abdominal pain, nausea/vomiting/diarrhea, dysuria, or weakness/numbness/paresthesias in any extremity   Physical Exam  Triage Vital Signs: ED Triage Vitals  Encounter Vitals Group     BP 05/21/24 2115 115/84     Girls Systolic BP Percentile --      Girls Diastolic BP Percentile --      Boys Systolic BP Percentile --      Boys Diastolic BP Percentile --      Pulse Rate 05/21/24 2115 (!) 120     Resp 05/21/24 2115 16     Temp 05/21/24 2115 98.6 F (37 C)     Temp Source 05/21/24 2115 Oral     SpO2 05/21/24 2115 98 %     Weight --      Height 05/21/24 2112 6' (1.829 m)     Head Circumference --      Peak Flow --      Pain Score 05/21/24 2112 10     Pain Loc --      Pain Education --      Exclude from Growth Chart --    Most recent vital signs: Vitals:   05/22/24 1527 05/22/24 2012  BP: 114/83 109/86  Pulse: 69 62  Resp: 16 19  Temp: 98.2 F (36.8 C) 97.7 F (36.5 C)  SpO2: 97% 100%   General: Awake, oriented x4. CV:  Good peripheral perfusion. Resp:  Normal effort. Abd:  No distention. Other:  Middle-aged disheveled well-developed, well-nourished Caucasian male resting comfortably in no  acute distress.  Mildly tremulous ED Results / Procedures / Treatments  Labs (all labs ordered are listed, but only abnormal results are displayed) Labs Reviewed  LIPASE, BLOOD - Abnormal; Notable for the following components:      Result Value   Lipase 184 (*)    All other components within normal limits  COMPREHENSIVE METABOLIC PANEL WITH GFR - Abnormal; Notable for the following components:   Sodium 132 (*)    Chloride 95 (*)    Glucose, Bld 104 (*)    Calcium  10.6 (*)    Total Protein 8.4 (*)    AST 43 (*)    All other components within normal limits  CBC - Abnormal; Notable for the following components:   RBC 4.00 (*)    Hemoglobin 11.6 (*)    HCT 35.5 (*)    RDW 17.6 (*)    nRBC 0.4 (*)    All other components within normal limits  URINALYSIS, ROUTINE W REFLEX MICROSCOPIC - Abnormal; Notable for the following components:   Color, Urine YELLOW (*)    APPearance CLEAR (*)    Specific Gravity, Urine >1.046 (*)    Ketones, ur 5 (*)    All other components within normal limits  ETHANOL - Abnormal; Notable  for the following components:   Alcohol , Ethyl (B) 106 (*)    All other components within normal limits  MAGNESIUM  - Abnormal; Notable for the following components:   Magnesium  2.5 (*)    All other components within normal limits  URINE DRUG SCREEN, QUALITATIVE ([facility] ONLY) - Abnormal; Notable for the following components:   Opiate, Ur Screen POSITIVE (*)    Barbiturates, Ur Screen POSITIVE (*)    Benzodiazepine, Ur Scrn POSITIVE (*)    All other components within normal limits  BASIC METABOLIC PANEL WITH GFR - Abnormal; Notable for the following components:   Sodium 134 (*)    All other components within normal limits  CBC - Abnormal; Notable for the following components:   WBC 3.8 (*)    RBC 3.32 (*)    Hemoglobin 9.7 (*)    HCT 29.9 (*)    RDW 18.0 (*)    Platelets 137 (*)    All other components within normal limits  PHOSPHORUS  TROPONIN I (HIGH SENSITIVITY)   TROPONIN I (HIGH SENSITIVITY)   PROCEDURES: Critical Care performed: No Procedures MEDICATIONS ORDERED IN ED: Medications  LORazepam  (ATIVAN ) tablet 1-4 mg (1 mg Oral Given 05/22/24 2036)    Or  LORazepam  (ATIVAN ) injection 1-4 mg ( Intravenous See Alternative 05/22/24 2036)  thiamine  (VITAMIN B1) tablet 100 mg (100 mg Oral Given 05/22/24 0954)    Or  thiamine  (VITAMIN B1) injection 100 mg ( Intravenous See Alternative 05/22/24 0954)  folic acid  (FOLVITE ) tablet 1 mg (1 mg Oral Given 05/22/24 0954)  multivitamin with minerals tablet 1 tablet (1 tablet Oral Given 05/22/24 0954)  0.9 %  sodium chloride  infusion ( Intravenous New Bag/Given 05/22/24 1819)  ondansetron  (ZOFRAN ) injection 4 mg (has no administration in time range)  acetaminophen  (TYLENOL ) tablet 650 mg (has no administration in time range)  nicotine  (NICODERM CQ  - dosed in mg/24 hours) patch 21 mg (21 mg Transdermal Not Given 05/22/24 1101)  oxyCODONE  (Oxy IR/ROXICODONE ) immediate release tablet 5 mg (has no administration in time range)  HYDROmorphone  (DILAUDID ) injection 1 mg (1 mg Intravenous Given 05/22/24 2037)  PHENObarbital  (LUMINAL) injection 97.5 mg (97.5 mg Intravenous Given 05/22/24 1542)    Followed by  PHENObarbital  (LUMINAL) injection 65 mg (has no administration in time range)    Followed by  PHENObarbital  (LUMINAL) injection 32.5 mg (has no administration in time range)  pantoprazole  (PROTONIX ) EC tablet 40 mg (40 mg Oral Given 05/22/24 0020)  bictegravir-emtricitabine -tenofovir  AF (BIKTARVY ) 50-200-25 MG per tablet 1 tablet (1 tablet Oral Given 05/22/24 0954)  lipase/protease/amylase (CREON ) capsule 24,000 Units (24,000 Units Oral Patient Refused/Not Given 05/22/24 1820)  HYDROmorphone  (DILAUDID ) injection 1 mg (1 mg Intravenous Given 05/21/24 2359)  sodium chloride  0.9 % bolus 1,000 mL (0 mLs Intravenous Stopped 05/22/24 0117)  PHENObarbital  (LUMINAL) injection 130 mg (130 mg Intravenous Given 05/22/24 0015)  iohexol   (OMNIPAQUE ) 300 MG/ML solution 100 mL (100 mLs Intravenous Contrast Given 05/22/24 0003)  iohexol  (OMNIPAQUE ) 350 MG/ML injection 100 mL (80 mLs Intravenous Contrast Given 05/22/24 1331)   IMPRESSION / MDM / ASSESSMENT AND PLAN / ED COURSE  I reviewed the triage vital signs and the nursing notes.                             The patient is on the cardiac monitor to evaluate for evidence of arrhythmia and/or significant heart rate changes. Patient's presentation is most consistent with acute presentation with potential threat  to life or bodily function. Given history and exam I have a low suspicion for AAA, SBO, appendicitis, mesenteric ischemia, nephrolithiasis, pyelonephritis, or diverticulitis. I have a moderate concern for pancreatitis, gastritis vs non-bleeding peptic ulcer, or hepatobiliary disease and thus will obtain labs and imaging.  ED Workup: CBC, BMP, LFTs, Lipase, LDH  No signs of severe pancreatitis on exam such as ecchymosis of periumbilical region or flanks, hypoxia, or tachypneia.  Disposition: Admit for bowel rest, continued analgesia, monitoring.   FINAL CLINICAL IMPRESSION(S) / ED DIAGNOSES   Final diagnoses:  Alcohol -induced acute pancreatitis, unspecified complication status  Nausea and vomiting, unspecified vomiting type  Alcohol  abuse  Alcohol  withdrawal syndrome without complication (HCC)   Rx / DC Orders   ED Discharge Orders     None      Note:  This document was prepared using Dragon voice recognition software and may include unintentional dictation errors.   Zurich Carreno K, MD 05/22/24 [phone]

## 2024-05-21 NOTE — H&P (Signed)
 History and Physical    Gary [name] FMW:[id number] DOB: 1986/11/03 DOA: 05/21/2024  Referring MD/NP/PA:   PCP: Pcp, No   Patient coming from:  The patient is homeless   Chief Complaint: Nausea, vomiting, abdominal pain, shaking  HPI: Gary [name] is a 38 y.o. male with medical history significant of homeless, polysubstance abuse, tobacco abuse, alcohol  abuse, alcoholic pancreatitis, DT, alcoholic withdrawal seizures, GERD, depression, anxiety, bipolar, PTSD, subdural hematoma, HIV, portal vein thrombosis, gastritis, duodenitis, who presents with nausea, vomiting, abdominal pain, shaking.  Patient has history of polysubstance abuse and alcohol  abuse. He has had frequent admission due to alcohol  withdrawal and alcoholic pancreatitis.  He was admitted again on 7/10 due to alcoholic pancreatitis, just discharged today.  Patient continues to drink alcohol , last drinkiong was this morning.  He developed worsening abdominal pain, which is diffused abdominal pain, constant, sharp, moderate to severe, radiating to the lower chest, associated with multiple episodes of nonbilious nonbloody vomiting, aggravated by eating food.  No diarrhea.  He states that he cannot keep anything down.  No cough, SOB, fever or chills.  No symptoms of UTI. Patient is tremulous and shaking during the interview.  Data reviewed independently and ED Course: pt was found to have lipase 184, alcohol  level 106, GFR> 60, WBC 6.7, calcium  10.6, temperature normal, blood pressure 116/88, heart rate 120, RR 19, oxygen saturation 98% on room air.  Patient is admitted to PCU as inpatient.  CT of abdomen/pelvis: Pending    EKG:  Not done in ED, will get one.    Review of Systems:   General: no fevers, chills, no body weight gain, has poor appetite, has fatigue HEENT: no blurry vision, hearing changes or sore throat Respiratory: no dyspnea, coughing, wheezing CV: no chest pain, no palpitations GI: has nausea, vomiting,  abdominal pain, no diarrhea, constipation GU: no dysuria, burning on urination, increased urinary frequency, hematuria  Ext: no leg edema Neuro: no unilateral weakness, numbness, or tingling, no vision change or hearing loss. Has tremulous and shaking. Skin: no rash, no skin tear. MSK: No muscle spasm, no deformity, no limitation of range of movement in spin Heme: No easy bruising.  Travel history: No recent long distant travel.   Allergy:  Allergies  Allergen Reactions   Risperidone  And Paliperidone Other (See Comments)    Patient states causes opposite effect (he will not take again)   Tegretol  [Carbamazepine ] Other (See Comments)    Vertigo  Paralysis   Caffeine Palpitations    Past Medical History:  Diagnosis Date   Alcohol  abuse    Alcohol -induced pancreatitis 04/16/2022   Anxiety    Bipolar 2 disorder (HCC)    HIV (human immunodeficiency virus infection) (HCC)    Pancreatitis    PTSD (post-traumatic stress disorder)    Schizophrenia (HCC)    Seizures (HCC)    Subdural hematoma (HCC)     Past Surgical History:  Procedure Laterality Date   BIOPSY  04/19/2022   Procedure: BIOPSY;  Surgeon: Wilhelmenia Aloha Raddle., MD;  Location: [facility] END[facility];  Service: Gastroenterology;;   ENTEROSCOPY N/A 04/19/2022   Procedure: ENTEROSCOPY;  Surgeon: Wilhelmenia Aloha Raddle., MD;  Location: [facility] ENDOSCOPY;  Service: Gastroenterology;  Laterality: N/A;   INCISION AND DRAINAGE PERIRECTAL ABSCESS N/A 09/24/2016   Procedure: IRRIGATION AND DEBRIDEMENT PERIRECTAL ABSCESS;  Surgeon: Carlin Pastel, MD;  Location: [facility] ORS;  Service: General;  Laterality: N/A;   none      Social History:  reports that he has been smoking cigarettes.  He started smoking about 21 years ago. He has a 10.8 pack-year smoking history. He has never used smokeless tobacco. He reports current alcohol  use of about 105.0 standard drinks of alcohol  per week. He reports that he does not currently use drugs after having used  the following drugs: Methamphetamines and Cocaine.  Family History:  Family History  Problem Relation Age of Onset   Hypertension Mother    Alcohol  abuse Mother    Alcoholism Mother    Alcohol  abuse Father    Colon cancer Other    Other Other    Cancer Other      Prior to Admission medications   Medication Sig Start Date End Date Taking? Authorizing Provider  bictegravir-emtricitabine -tenofovir  AF (BIKTARVY ) 50-200-25 MG TABS tablet Take 1 tablet by mouth daily. 02/24/24   Von Bellis, MD  chlordiazePOXIDE  (LIBRIUM ) 25 MG capsule Take 1 capsule (25 mg total) by mouth 3 (three) times daily. Today and then take twice daily for next 2 days, followed by once a day until you finish all the tablets. 05/21/24   Caleen Qualia, MD  feeding supplement (ENSURE PLUS HIGH PROTEIN) LIQD Take 237 mLs by mouth 2 (two) times daily between meals. 04/25/24   Bennett, Christal H, NP  folic acid  (FOLVITE ) 1 MG tablet Take 1 tablet (1 mg total) by mouth daily. 03/06/24   Dorinda Drue DASEN, MD  gabapentin  (NEURONTIN ) 100 MG capsule Take 2 capsules (200 mg total) by mouth 3 (three) times daily. 04/25/24   Bennett, Christal H, NP  Multiple Vitamin (MULTIVITAMIN WITH MINERALS) TABS tablet Take 1 tablet by mouth daily. Patient not taking: Reported on 05/19/2024 04/26/24   Blair Robin H, NP  nicotine  (NICODERM CQ  - DOSED IN MG/24 HOURS) 14 mg/24hr patch Place 1 patch (14 mg total) onto the skin daily. Patient not taking: Reported on 05/09/2024 04/26/24   Blair Robin H, NP  Pancrelipase , Lip-Prot-Amyl, 24000-76000 units CPEP Take 1 capsule (24,000 Units total) by mouth 3 (three) times daily before meals. 05/21/24   Amin, Sumayya, MD  pantoprazole  (PROTONIX ) 40 MG tablet Take 1 tablet (40 mg total) by mouth 2 (two) times daily. Patient taking differently: Take 40 mg by mouth 2 (two) times daily as needed (Acid reflux). 03/05/24   Dorinda Drue DASEN, MD  QUEtiapine  (SEROQUEL ) 50 MG tablet Take 1 tablet (50 mg total) by  mouth at bedtime. Patient not taking: Reported on 05/19/2024 04/25/24   Blair Robin H, NP  thiamine  (VITAMIN B-1) 100 MG tablet Take 1 tablet (100 mg total) by mouth daily. 03/06/24   Dorinda Drue DASEN, MD  famotidine  (PEPCID ) 20 MG tablet Take 1 tablet (20 mg total) by mouth 2 (two) times daily. Patient not taking: Reported on 06/01/2019 05/14/19 06/02/19  [name], MD    Physical Exam: Vitals:   05/21/24 2112 05/21/24 2115 05/21/24 2200 05/21/24 2230  BP:  115/84 107/82 116/88  Pulse:  (!) 120 98 (!) 103  Resp:  16 20 19   Temp:  98.6 F (37 C)    TempSrc:  Oral    SpO2:  98% 99% 100%  Height: 6' (1.829 m)      General: Patient is tremulous.  Dry mucous membrane HEENT:       Eyes: PERRL, EOMI, no jaundice       ENT: No discharge from the ears and nose, no pharynx injection, no tonsillar enlargement.        Neck: No JVD, no bruit, no mass felt. Heme: No neck lymph node  enlargement. Cardiac: S1/S2, RRR, No murmurs, No gallops or rubs. Respiratory: No rales, wheezing, rhonchi or rubs. GI: Soft, nondistended, has tenderness mainly in the upper abdomen, no rebound pain, no organomegaly, BS present. GU: No hematuria Ext: No pitting leg edema bilaterally. 1+DP/PT pulse bilaterally. Musculoskeletal: No joint deformities, No joint redness or warmth, no limitation of ROM in spin. Skin: No rashes.  Neuro: Alert, oriented X3, cranial nerves II-XII grossly intact, moves all extremities normally.  Psych: Patient is not psychotic, no suicidal or hemocidal ideation.  Labs on Admission: I have personally reviewed following labs and imaging studies  CBC: Recent Labs  Lab 05/16/24 1218 05/18/24 1922 05/20/24 0351 05/21/24 0902 05/21/24 2114  WBC 3.6* 4.0 3.2* 2.4* 6.7  HGB 10.1* 10.7* 10.3* 10.8* 11.6*  HCT 31.8* 33.0* 31.7* 33.2* 35.5*  MCV 91.9 91.2 89.8 90.2 88.8  PLT 191 273 138* 143* 189   Basic Metabolic Panel: Recent Labs  Lab 05/18/24 1922 05/19/24 0659  05/20/24 0351 05/21/24 0902 05/21/24 2114  NA 142 140 134* 132* 132*  K 3.3* 3.9 3.8 4.0 4.2  CL 106 107 99 98 95*  CO2 23 25 28 24 23   GLUCOSE 122* 87 109* 84 104*  BUN 6 6 6 6 13   CREATININE 0.56* 0.44* 0.45* 0.51* 1.24  CALCIUM  8.3* 7.3* 8.8* 9.1 10.6*  MG  --  1.6* 1.9  --  2.5*  PHOS  --   --   --   --  4.6   GFR: Estimated Creatinine Clearance: 73.8 mL/min (by C-G formula based on SCr of 1.24 mg/dL). Liver Function Tests: Recent Labs  Lab 05/16/24 1218 05/18/24 1922 05/20/24 0351 05/21/24 2114  AST 28 38 32 43*  ALT 27 23 23 24   ALKPHOS 44 47 58 65  BILITOT 0.6 0.4 1.0 0.5  PROT 6.6 7.3 6.7 8.4*  ALBUMIN 3.6 3.8 3.7 4.5   Recent Labs  Lab 05/18/24 1922 05/21/24 2114  LIPASE 35 184*   No results for input(s): AMMONIA in the last 168 hours. Coagulation Profile: No results for input(s): INR, PROTIME in the last 168 hours. Cardiac Enzymes: No results for input(s): CKTOTAL, CKMB, CKMBINDEX, TROPONINI in the last 168 hours. BNP (last 3 results) No results for input(s): PROBNP in the last 8760 hours. HbA1C: No results for input(s): HGBA1C in the last 72 hours. CBG: No results for input(s): GLUCAP in the last 168 hours. Lipid Profile: No results for input(s): CHOL, HDL, LDLCALC, TRIG, CHOLHDL, LDLDIRECT in the last 72 hours. Thyroid  Function Tests: No results for input(s): TSH, T4TOTAL, FREET4, T3FREE, THYROIDAB in the last 72 hours. Anemia Panel: No results for input(s): VITAMINB12, FOLATE, FERRITIN, TIBC, IRON, RETICCTPCT in the last 72 hours. Urine analysis:    Component Value Date/Time   COLORURINE COLORLESS (A) 03/01/2024 1602   APPEARANCEUR CLEAR (A) 03/01/2024 1602   APPEARANCEUR Clear 11/15/2014 1755   LABSPEC 1.004 (L) 03/01/2024 1602   LABSPEC 1.002 11/15/2014 1755   PHURINE 6.0 03/01/2024 1602   GLUCOSEU NEGATIVE 03/01/2024 1602   GLUCOSEU Negative 11/15/2014 1755   HGBUR NEGATIVE  03/01/2024 1602   BILIRUBINUR NEGATIVE 03/01/2024 1602   BILIRUBINUR Negative 11/15/2014 1755   KETONESUR NEGATIVE 03/01/2024 1602   PROTEINUR NEGATIVE 03/01/2024 1602   NITRITE NEGATIVE 03/01/2024 1602   LEUKOCYTESUR NEGATIVE 03/01/2024 1602   LEUKOCYTESUR Negative 11/15/2014 1755   Sepsis Labs: @LABRCNTIP (procalcitonin:4,lacticidven:4) )No results found for this or any previous visit (from the past 240 hours).   Radiological Exams on Admission:   Assessment/Plan  Principal Problem:   Alcohol  withdrawal (HCC) Active Problems:   Acute on chronic pancreatitis (HCC)   HIV (human immunodeficiency virus infection) (HCC)   Homelessness   Hypercalcemia   GERD (gastroesophageal reflux disease)   Polysubstance abuse (HCC)   Tobacco use disorder   Bipolar 2 disorder (HCC)   Depression with anxiety   Assessment and Plan:  Alcohol  withdrawal (HCC): Alcohol  level 106, last drinking was this morning, already developed alcohol  withdrawal symptoms.  Patient is tremulous and shaking during interview, very tachycardic with heart rate up to 120.  He had history of delirium tremor and alcohol  withdrawal seizure in the past. He is at high risk.   -Admitted to PCU as inpatient -Start phenobarbital  tapering with pharmacist consult -Thiamine  100 mg daily -Folic acid  1 mg daily -Seizure precaution -prn ativan   -Fall precaution -IV fluid: 1 L normal saline, then 125 cc/h -Consult TOC  Acute on chronic pancreatitis [facility]): Lipase 184, with diffuse abdominal pain. - N.p.o. - Pain control: As needed Dilaudid  and oxycodone  - As needed Zofran  for nausea vomiting - IV fluid as above - Continue corral - Follow-up CT scan of abdomen/pelvis which is ordered by EDP  HIV (human immunodeficiency virus infection) (HCC): Viral load less than 20 on 01/28/2024, CD4 was 598 on 12/08/23. Pt states that he lost his Biktarvy  - Resume Biktarvy   Homelessness -consult TOC  Hypercalcemia: Mild, calcium  10.6,  likely due to dehydration -IV fluid as above - Check magnesium  level - Check phosphorus level  GERD (gastroesophageal reflux disease) -Protonix   Polysubstance abuse and tobacco use disorder -Data counseling about importance of quitting substance use - Nicotine  patch - Check UDS  Bipolar 2 disorder (HCC) and depression with anxiety: -pt is not taking Seroquel  -on prn Ativan   Pancreatic lesion: Per previous discharge summary, patient has prior imaging and recent concern of small progression of a heterogeneous area in pancreatic head, likely due to persistent pancreatitis but need to rule out malignancy as he is high risk. Patient will need a pancreatic dedicated MRI in 3 to 4 weeks. - CA 19-9 was ordered in previous admission, with result pending      DVT ppx: SCD  Code Status: Full code   Family Communication:     not done, no family member is at bed side.    Disposition Plan: To be determined, may need shelter due to homeless,  Consults called:  none  Admission status and Level of care: Progressive as inpt        Dispo: The patient is from: Homeless              Anticipated d/c is to: LTAC              Anticipated d/c date is: To be determined              Patient currently is not medically stable to d/c.    Severity of Illness:  The appropriate patient status for this patient is INPATIENT. Inpatient status is judged to be reasonable and necessary in order to provide the required intensity of service to ensure the patient's safety. The patient's presenting symptoms, physical exam findings, and initial radiographic and laboratory data in the context of their chronic comorbidities is felt to place them at high risk for further clinical deterioration. Furthermore, it is not anticipated that the patient will be medically stable for discharge from the hospital within 2 midnights of admission.   * I certify that at the point of admission it  is my clinical judgment that the  patient will require inpatient hospital care spanning beyond 2 midnights from the point of admission due to high intensity of service, high risk for further deterioration and high frequency of surveillance required.*       Date of Service 05/22/2024    Caleb Exon [facility]   If 7PM-7AM, please contact night-coverage [email or web address] 05/22/2024, 12:01 AM

## 2024-05-22 ENCOUNTER — Inpatient Hospital Stay: Payer: MEDICAID

## 2024-05-22 DIAGNOSIS — F3181 Bipolar II disorder: Secondary | ICD-10-CM

## 2024-05-22 DIAGNOSIS — K859 Acute pancreatitis without necrosis or infection, unspecified: Secondary | ICD-10-CM

## 2024-05-22 DIAGNOSIS — Z21 Asymptomatic human immunodeficiency virus [HIV] infection status: Secondary | ICD-10-CM | POA: Diagnosis not present

## 2024-05-22 DIAGNOSIS — F1910 Other psychoactive substance abuse, uncomplicated: Secondary | ICD-10-CM

## 2024-05-22 DIAGNOSIS — F10939 Alcohol use, unspecified with withdrawal, unspecified: Secondary | ICD-10-CM | POA: Diagnosis not present

## 2024-05-22 DIAGNOSIS — K869 Disease of pancreas, unspecified: Secondary | ICD-10-CM

## 2024-05-22 DIAGNOSIS — Z5900 Homelessness unspecified: Secondary | ICD-10-CM

## 2024-05-22 DIAGNOSIS — K219 Gastro-esophageal reflux disease without esophagitis: Secondary | ICD-10-CM

## 2024-05-22 DIAGNOSIS — F17200 Nicotine dependence, unspecified, uncomplicated: Secondary | ICD-10-CM

## 2024-05-22 DIAGNOSIS — K861 Other chronic pancreatitis: Secondary | ICD-10-CM

## 2024-05-22 DIAGNOSIS — F418 Other specified anxiety disorders: Secondary | ICD-10-CM

## 2024-05-22 LAB — CBC
HCT: 29.9 % — ABNORMAL LOW (ref 39.0–52.0)
Hemoglobin: 9.7 g/dL — ABNORMAL LOW (ref 13.0–17.0)
MCH: 29.2 pg (ref 26.0–34.0)
MCHC: 32.4 g/dL (ref 30.0–36.0)
MCV: 90.1 fL (ref 80.0–100.0)
Platelets: 137 K/uL — ABNORMAL LOW (ref 150–400)
RBC: 3.32 MIL/uL — ABNORMAL LOW (ref 4.22–5.81)
RDW: 18 % — ABNORMAL HIGH (ref 11.5–15.5)
WBC: 3.8 K/uL — ABNORMAL LOW (ref 4.0–10.5)
nRBC: 0 % (ref 0.0–0.2)

## 2024-05-22 LAB — BASIC METABOLIC PANEL WITH GFR
Anion gap: 8 (ref 5–15)
BUN: 12 mg/dL (ref 6–20)
CO2: 25 mmol/L (ref 22–32)
Calcium: 9 mg/dL (ref 8.9–10.3)
Chloride: 101 mmol/L (ref 98–111)
Creatinine, Ser: 0.61 mg/dL (ref 0.61–1.24)
GFR, Estimated: >60 mL/min (ref >60)
Glucose, Bld: 85 mg/dL (ref 70–99)
Potassium: 4 mmol/L (ref 3.5–5.1)
Sodium: 134 mmol/L — ABNORMAL LOW (ref 135–145)

## 2024-05-22 LAB — URINE DRUG SCREEN, QUALITATIVE (ARMC ONLY)
Amphetamines, Ur Screen: NOT DETECTED
Barbiturates, Ur Screen: POSITIVE — AB
Benzodiazepine, Ur Scrn: POSITIVE — AB
Cannabinoid 50 Ng, Ur ~~LOC~~: NOT DETECTED
Cocaine Metabolite,Ur ~~LOC~~: NOT DETECTED
MDMA (Ecstasy)Ur Screen: NOT DETECTED
Methadone Scn, Ur: NOT DETECTED
Opiate, Ur Screen: POSITIVE — AB
Phencyclidine (PCP) Ur S: NOT DETECTED
Tricyclic, Ur Screen: NOT DETECTED

## 2024-05-22 LAB — URINALYSIS, ROUTINE W REFLEX MICROSCOPIC
Bilirubin Urine: NEGATIVE
Glucose, UA: NEGATIVE mg/dL
Hgb urine dipstick: NEGATIVE
Ketones, ur: 5 mg/dL — AB
Leukocytes,Ua: NEGATIVE
Nitrite: NEGATIVE
Protein, ur: NEGATIVE mg/dL
Specific Gravity, Urine: >1.046 — ABNORMAL HIGH (ref 1.005–1.030)
pH: 6 (ref 5.0–8.0)

## 2024-05-22 LAB — MAGNESIUM: Magnesium: 2.5 mg/dL — ABNORMAL HIGH (ref 1.7–2.4)

## 2024-05-22 LAB — CA 19-9 (SERIAL): CA 19-9: 3 U/mL (ref 0–35)

## 2024-05-22 LAB — PHOSPHORUS: Phosphorus: 4.6 mg/dL (ref 2.5–4.6)

## 2024-05-22 LAB — TROPONIN I (HIGH SENSITIVITY)
Troponin I (High Sensitivity): 7 ng/L (ref <18)
Troponin I (High Sensitivity): 5 ng/L (ref <18)

## 2024-05-22 MED ORDER — BICTEGRAVIR-EMTRICITAB-TENOFOV 50-200-25 MG PO TABS
1.0000 | ORAL_TABLET | Freq: Every day | ORAL | Status: DC
  Administered 2024-05-22 – 2024-05-26 (×5): 1 via ORAL
  Filled 2024-05-22 (×5): qty 1

## 2024-05-22 MED ORDER — IOHEXOL 350 MG/ML SOLN
100.0000 mL | Freq: Once | INTRAVENOUS | Status: AC | PRN
  Administered 2024-05-22: 80 mL via INTRAVENOUS

## 2024-05-22 MED ORDER — PANCRELIPASE (LIP-PROT-AMYL) 12000-38000 UNITS PO CPEP
24000.0000 [IU] | ORAL_CAPSULE | Freq: Three times a day (TID) | ORAL | Status: DC
  Administered 2024-05-22 – 2024-05-25 (×9): 24000 [IU] via ORAL
  Filled 2024-05-22 (×10): qty 2

## 2024-05-22 NOTE — Assessment & Plan Note (Signed)
 UDS was ordered on admission-still pending. Patient denies any other illicit drug except alcohol  and smoking

## 2024-05-22 NOTE — Assessment & Plan Note (Signed)
 Continue with PPI

## 2024-05-22 NOTE — Progress Notes (Signed)
 Patient admitted to floor in NAD. VSS at this time. Pt has slight tremor to his hands when reaching. Pt states I need that medicine. Pt asked to elaborate and stated I need my ativan . It's the only thing that works. I don't think the phenobarb works and its not as strong. Pt educated on utilization of both medications and pt repeated his statement. Pt also educated on NPO status and states he is hungry. Pt provided education on pancreatitis, gut rest and alcohol  cessation. Pt stated that he could not afford the meds he was discharged with and that I can get free alcohol  and drugs any time I want in [place]. This RN educated the patient that the alcohol  is causing the pancreatitis. Pt stated No, it's what's causing my withdrawal symptoms. Attempted to educate patient again, Pt refused to listen to this RN. Pt stated I'm only gonna be here for a little while anyway just get me my pain meds. Asked patient what his pain was and he stated  It's always a 10. Pt appears in NAD, resting in bed, changing the channels on the TV. Provided pt with PRN pain meds and sched phenobarb. Educated patient to call if he needed something else. Pt verbalizes understanding.

## 2024-05-22 NOTE — Assessment & Plan Note (Signed)
 Nicotine patch as needed

## 2024-05-22 NOTE — Progress Notes (Signed)
 Progress Note   Patient: Gary [name] FMW:[id number] DOB: June 11, 1986 DOA: 05/21/2024     1 DOS: the patient was seen and examined on 05/22/2024   Brief hospital course: Taken from H&P.  Gary [name] is a 38 y.o. male with medical history significant of homeless, polysubstance abuse, tobacco abuse, alcohol  abuse, alcoholic pancreatitis, DT, alcoholic withdrawal seizures, GERD, depression, anxiety, bipolar, PTSD, subdural hematoma, HIV, portal vein thrombosis, gastritis, duodenitis, who presents with nausea, vomiting, abdominal pain, shaking.   Patient has history of polysubstance abuse and alcohol  abuse. He has had frequent admission due to alcohol  withdrawal and alcoholic pancreatitis.  He was admitted again on 7/10 due to alcoholic pancreatitis, he was discharged earlier in the morning at his own request when he was threatening to leave AMA, he promised at that time that he will not drink and was supposed to go to his brother's home.  Case was discussed with brother to supervise as he was given 10 pills of Librium  to help with his withdrawal.  His CIWA score was 4.  Patient went home and started drinking, developed another episode of abdominal pain with nausea and vomiting which prompted another admission.  Patient comes to ED very frequently and multiple admissions for similar reasons.  On presentation found to have lipase of 184, alcohol  levels 106, vital stable.  Repeat CT abdomen during current admission with following 1. Interval enlargement of the acute peripancreatic inflammatory fluid collection adjacent to the head of the pancreas and coursing along the portal triad now measuring at least 3.0 x 5.3 cm. There is marked, progressive narrowing of the proximal portal vein, with the vessel now measuring 1 mm in diameter at its narrowest segment. There is also severe narrowing of the intrahepatic inferior vena cava, progressive since prior examination but of unclear  etiology. Interval development of multiple periduodenal and peribiliary portal venous collaterals. 2. Moderate hepatic steatosis. 3. Interval development of trace ascites.  Patient was started on phenobarbital  taper for alcohol  withdrawal and again admitted for supportive care.  7/14: Vital stable, CTA abdomen was ordered to rule out any thrombosis with concerning features on CT abdomen.  Troponin was checked overnight for concern of chest pain and it remained negative.  Patient becoming very agitated when tried to consult and confront that he did not did anything what he promised yesterday before discharge.  He was just keep asking for more pain medications and going back and forth between yesterday's conversation stating that we are just not controlling his pain.  Patient will remain high risk for morbidity, mortality and readmissions based on his behavior and continuation of alcohol  abuse.    Assessment and Plan: * Acute on chronic pancreatitis [facility]) Multiple hospitalization most recent was 2 days ago and he was discharged yesterday morning at his request otherwise he was threatening to leave AMA, apparently went out and started drinking and came back with worsening abdominal pain.  This time lipase was elevated to 184 and repeat CT abdomen and pelvis with increased inflammation and decreased vessel caliber. - CT abdomen and pelvis was ordered to rule out thrombosis -Patient will remain high risk due to his behavior -Continue with supportive care  Alcohol  withdrawal (HCC) Ongoing issue even when he said that he wants to quit.  Had a lengthy discussion with brother and family is also struggling with this behavior.  Counseling was again provided but he was becoming very agitated. - Continue CIWA protocol -Continue with phenobarbital  taper  HIV (human immunodeficiency virus infection) (  HCC) Viral load less than 20 on 01/28/2024, CD4 was 598 on 12/08/23. Pt states that he lost his  Biktarvy  - Resume Biktarvy   Hypercalcemia Resolved with hydration. - Continue to monitor  Homelessness Patient was discharged to his brother's home after talking with brother but apparently he never went there and went to his company where he started drinking. - TOC was consulted  GERD (gastroesophageal reflux disease) - Continue with PPI  Polysubstance abuse (HCC) UDS was ordered on admission-still pending. Patient denies any other illicit drug except alcohol  and smoking  Pancreatic lesion Per previous discharge summary, patient has prior imaging and recent concern of small progression of a heterogeneous area in pancreatic head, likely due to persistent pancreatitis but need to rule out malignancy as he is high risk. Patient will need a pancreatic dedicated MRI in 3 to 4 weeks. - CA 19-9 was ordered in previous admission, with result pending  Tobacco use disorder - Nicotine  patch as needed  Bipolar 2 disorder (HCC) Patient has stopped taking Seroquel  -As needed Ativan    Subjective: Patient was lying comfortably but started complaining about uncontrolled pain when I started talking.  Had a lengthy discussion regarding my conversation with him yesterday before discharge and he did not did anything which he promised to do.  Per patient he did not had money to get his Librium  but apparently had money for the alcohol ??  On confrontation and counseling he was becoming very agitated and keeps saying that you are not controlling my pain.  Physical Exam: Vitals:   05/22/24 0800 05/22/24 1000 05/22/24 1153 05/22/24 1206  BP:  108/84 109/78   Pulse:  71 86   Resp:  12    Temp: 98.2 F (36.8 C)   98 F (36.7 C)  TempSrc: Oral     SpO2:  100%    Height:       General.  Severely malnourished gentleman, in no acute distress. Pulmonary.  Lungs clear bilaterally, normal respiratory effort. CV.  Regular rate and rhythm, no JVD, rub or murmur. Abdomen.  Soft, nontender, nondistended,  BS positive. CNS.  Alert and oriented .  No focal neurologic deficit. Extremities.  No edema, pulses intact and symmetrical. Psychiatry.  Judgment and insight appears normal.   Data Reviewed: Prior data reviewed  Family Communication:   Disposition: Status is: Inpatient Remains inpatient appropriate because: Severity of illness  Planned Discharge Destination: Home  Time spent: 50 minutes  This record has been created using Conservation officer, historic buildings. Errors have been sought and corrected,but may not always be located. Such creation errors do not reflect on the standard of care.   Author: Amaryllis Dare, MD 05/22/2024 1:16 PM  For on call review [email or web address].

## 2024-05-22 NOTE — Hospital Course (Addendum)
 Taken from H&P.  Gary [name] is a 38 y.o. male with medical history significant of homeless, polysubstance abuse, tobacco abuse, alcohol  abuse, alcoholic pancreatitis, DT, alcoholic withdrawal seizures, GERD, depression, anxiety, bipolar, PTSD, subdural hematoma, HIV, portal vein thrombosis, gastritis, duodenitis, who presents with nausea, vomiting, abdominal pain, shaking.   Patient has history of polysubstance abuse and alcohol  abuse. He has had frequent admission due to alcohol  withdrawal and alcoholic pancreatitis.  He was admitted again on 7/10 due to alcoholic pancreatitis, he was discharged earlier in the morning at his own request when he was threatening to leave AMA, he promised at that time that he will not drink and was supposed to go to his brother's home.  Case was discussed with brother to supervise as he was given 10 pills of Librium  to help with his withdrawal.  His CIWA score was 4.  Patient went home and started drinking, developed another episode of abdominal pain with nausea and vomiting which prompted another admission.  Patient comes to ED very frequently and multiple admissions for similar reasons.  On presentation found to have lipase of 184, alcohol  levels 106, vital stable.  Repeat CT abdomen during current admission with following 1. Interval enlargement of the acute peripancreatic inflammatory fluid collection adjacent to the head of the pancreas and coursing along the portal triad now measuring at least 3.0 x 5.3 cm. There is marked, progressive narrowing of the proximal portal vein, with the vessel now measuring 1 mm in diameter at its narrowest segment. There is also severe narrowing of the intrahepatic inferior vena cava, progressive since prior examination but of unclear etiology. Interval development of multiple periduodenal and peribiliary portal venous collaterals. 2. Moderate hepatic steatosis. 3. Interval development of trace ascites.  Patient was  started on phenobarbital  taper for alcohol  withdrawal and again admitted for supportive care.  7/14: Vital stable, CTA abdomen was ordered to rule out any thrombosis with concerning features on CT abdomen.  Troponin was checked overnight for concern of chest pain and it remained negative.  Patient becoming very agitated when tried to consult and confront that he did not did anything what he promised yesterday before discharge.  He was just keep asking for more pain medications and going back and forth between yesterday's conversation stating that we are just not controlling his pain.  Patient will remain high risk for morbidity, mortality and readmissions based on his behavior and continuation of alcohol  abuse.  7/15: Hemodynamically stable, appears comfortable but keep asking for more and more Dilaudid -Dilaudid  duration was increased to 6-hour as needed.  Starting on clear liquid diet. He was keep asking for hourly Ativan  and Dilaudid  only.  CIWA score remains 0. CT abdomen was negative for any vascular abnormality.  7/16: Hemodynamically stable, keep asking for more Dilaudid .  Toradol  was ordered.  CIWA score of 4.  Diet advanced to soft.  7/17: Vital stable, CIWA score of 3, continue to complain about significant pain which increased with food-Creon  and Toradol  dose was increased.  7/18: Hemodynamically stable, CIWA score of 0.  Abdominal pain with significant improvement with increased dose of Creon .  Patient wants to go home.  Patient was provided with prescriptions for Creon  and 6 doses of phenobarbital  taper.  Patient was counseled extensively again for alcohol  cessation.  Per patient he is going to his brother's home.  A taxi voucher was also provided.  Patient will continue on current medications and need to have a close follow-up with his providers.  Patient will  remain high risk for readmission based on prior history and high risk behavior.

## 2024-05-22 NOTE — Assessment & Plan Note (Signed)
 Patient was discharged to his brother's home after talking with brother but apparently he never went there and went to his company where he started drinking. - TOC was consulted

## 2024-05-22 NOTE — Assessment & Plan Note (Signed)
 Multiple hospitalization most recent was 2 days ago and he was discharged yesterday morning at his request otherwise he was threatening to leave AMA, apparently went out and started drinking and came back with worsening abdominal pain.  This time lipase was elevated to 184 and repeat CT abdomen and pelvis with increased inflammation and decreased vessel caliber. - CT abdomen and pelvis was ordered to rule out thrombosis -Patient will remain high risk due to his behavior -Continue with supportive care

## 2024-05-22 NOTE — Assessment & Plan Note (Signed)
 Resolved with hydration. -Continue to monitor

## 2024-05-22 NOTE — Discharge Instructions (Signed)
 Intensive Outpatient Programs   [facility] [address] [place],  [place], [place] [phone]  [facility] (Inpatient and outpatient)[phone] (Suboxone and Methadone) [address] [phone]  [facility]: [place]  [facility] Programs - Intensive Outpatient [address] [place], [place] [address], [place]  [phone]  [facility] (Outpatient, Inpatient, [facility] (Groups and Residental) (insurance only) [phone] [place], [place] [phone]   [facility] (for caregivers and family) [address] [place] [address], [place], [place] [phone]  Residential Treatment Programs  [facility](2 years) Residential: 66 days)[facility] ([facility] [address] [place], [place], [place] [phone] or [phone]  [facility] [address] [place], [place], [place] [phone]  [facility] (RTS) [address] [place], [place], [place] [phone] Admissions: 8am-3pm M-F  [facility] Program: Residential Program [phone] Days)             ADATC: [facility]  [place], [place], [place]  [phone] or [phone] in Hours over the weekend or by referral)  [facility] [address] [place], [place] [address] [phone] (Do virtual or phone assessment, offer transportation within 25 miles, have in patient and Outpatient options)   Mobil Crisis: [facility]:[phone] (for crisis  response 24 hours a day)     Intensive Outpatient Programs   [facility]  [facility] [address]  [address] [place],  [place]  [place], [place] [phone]  [facility]  [facility] (Inpatient and outpatient)    [phone] (Suboxone and Methadone) [address] [phone]  [facility] - Intensive Outpatient [address]  [address] [place], [place] [address] [place], [place]  [phone]  [facility] (Outpatient, Inpatient, [facility] (Groups and Residental) (insurance only) [phone] [place], [place]   [phone]   [facility] (for caregivers and family) [address]  [address] [place], [place]  [place], [place] [phone]  Residential Treatment Programs  [facility](2 years) Residential: 90 days)  [facility] ([facility]  [address] [place], [place]  [place], [place] [phone] or [phone]  [facility] [address] [place], [place]  [place], [place] [phone]  [facility] Residential Treatment Facility [facility] (RTS) [address] [place], [place] [address] [place], [place] [phone] Admissions: 8am-3pm M-F  [facility] Program: Residential Program [phone] Days)           ADATC: [facility]  [place], [place]  [place], [place]  [phone] or [phone] (Walk in Hours over the weekend or by referral)  [facility] [address] [place], [place] [address] [phone] (Do virtual or phone assessment, offer transportation within 25 miles, have in patient and Outpatient  options)   Mobil Crisis: [facility]:[phone] (for crisis response 24 hours a day)

## 2024-05-22 NOTE — Assessment & Plan Note (Signed)
 Viral load less than 20 on 01/28/2024, CD4 was 598 on 12/08/23. Pt states that he lost his Biktarvy  - Resume Biktarvy 

## 2024-05-22 NOTE — TOC Progression Note (Signed)
 Transition of Care [facility]) - Progression Note    Patient Details  Name: Gary [name] MRN: [id number] Date of Birth: [birth year]  [facility] CM/SW Contact  Marinda Cooks, RN Phone Number: 05/22/2024, 10:11 AM  Clinical Narrative:    This CM consulted regarding pt's substance abuse , Substance abuse resources placed on pt's AVS to review . TOC will cont to follow dc planning / care coordination and update as applicable.        Expected Discharge Plan and Services                                               Social Determinants of Health (SDOH) Interventions SDOH Screenings   Food Insecurity: Patient Declined (04/18/2024)  Recent Concern: Food Insecurity - Food Insecurity Present (02/19/2024)  Housing: Patient Declined (04/18/2024)  Recent Concern: Housing - High Risk (02/19/2024)  Transportation Needs: Patient Declined (04/18/2024)  Recent Concern: Transportation Needs - Unmet Transportation Needs (02/19/2024)  Utilities: Patient Declined (04/18/2024)  Alcohol  Screen: High Risk (04/17/2024)  Depression (PHQ2-9): Medium Risk (01/28/2024)  Financial Resource Strain: Patient Declined (10/11/2023)  Social Connections: Patient Declined (04/18/2024)  Recent Concern: Social Connections - Socially Isolated (02/03/2024)  Stress: No Stress Concern Present (06/30/2022)   Received from [facility]  Recent Concern: Stress - Stress Concern Present (06/25/2022)   Received from [facility]  Tobacco Use: High Risk (05/21/2024)    Readmission Risk Interventions    02/14/2024    3:10 PM 12/15/2023   11:23 AM 11/29/2023   10:27 AM  Readmission Risk Prevention Plan  Transportation Screening Complete Complete Complete  Medication Review Oceanographer) Complete Complete Complete  PCP or Specialist appointment within 3-5 days of discharge Patient refused Complete   HRI or Home Care Consult Complete Complete Complete  SW Recovery Care/Counseling Consult Complete Complete  Complete  Palliative Care Screening Not Applicable Not Applicable Not Applicable  Skilled Nursing Facility Not Applicable Not Applicable Not Applicable

## 2024-05-22 NOTE — Assessment & Plan Note (Signed)
 Per previous discharge summary, patient has prior imaging and recent concern of small progression of a heterogeneous area in pancreatic head, likely due to persistent pancreatitis but need to rule out malignancy as he is high risk. Patient will need a pancreatic dedicated MRI in 3 to 4 weeks. - CA 19-9 was ordered in previous admission, with result pending

## 2024-05-22 NOTE — Assessment & Plan Note (Signed)
 Ongoing issue even when he said that he wants to quit.  Had a lengthy discussion with brother and family is also struggling with this behavior.  Counseling was again provided but he was becoming very agitated. - Continue CIWA protocol -Continue with phenobarbital  taper

## 2024-05-22 NOTE — Assessment & Plan Note (Signed)
 Patient has stopped taking Seroquel  -As needed Ativan 

## 2024-05-22 NOTE — Progress Notes (Signed)
 Completed EKG. EKG was abnormal and reported NSTEMI.Notified Dr. Lawence, he reviewed EKG and placed orders for troponin. Patient denies any chest pain.

## 2024-05-23 DIAGNOSIS — Z21 Asymptomatic human immunodeficiency virus [HIV] infection status: Secondary | ICD-10-CM | POA: Diagnosis not present

## 2024-05-23 DIAGNOSIS — F10939 Alcohol use, unspecified with withdrawal, unspecified: Secondary | ICD-10-CM | POA: Diagnosis not present

## 2024-05-23 DIAGNOSIS — K859 Acute pancreatitis without necrosis or infection, unspecified: Secondary | ICD-10-CM | POA: Diagnosis not present

## 2024-05-23 LAB — GLUCOSE, CAPILLARY
Glucose-Capillary: 50 mg/dL — ABNORMAL LOW (ref 70–99)
Glucose-Capillary: 135 mg/dL — ABNORMAL HIGH (ref 70–99)

## 2024-05-23 MED ORDER — HYDROMORPHONE HCL 1 MG/ML IJ SOLN
1.0000 mg | Freq: Four times a day (QID) | INTRAMUSCULAR | Status: DC | PRN
  Administered 2024-05-23 – 2024-05-26 (×11): 1 mg via INTRAVENOUS
  Filled 2024-05-23 (×11): qty 1

## 2024-05-23 MED ORDER — KETOROLAC TROMETHAMINE 15 MG/ML IJ SOLN
15.0000 mg | Freq: Four times a day (QID) | INTRAMUSCULAR | Status: DC | PRN
  Administered 2024-05-23 – 2024-05-24 (×3): 15 mg via INTRAVENOUS
  Filled 2024-05-23 (×3): qty 1

## 2024-05-23 MED ORDER — HYDROMORPHONE HCL 1 MG/ML IJ SOLN
1.0000 mg | INTRAMUSCULAR | Status: DC | PRN
  Administered 2024-05-23: 1 mg via INTRAVENOUS
  Filled 2024-05-23: qty 1

## 2024-05-23 NOTE — Plan of Care (Signed)
 This patient remains on AR-2A as of time of writing. The patient is AA+Ox4, endorses on-going abdominal pain (see PRN pain meds on flow sheet). CIWA assessment q 6 hours. MIVF now transitioned to saline lock. Patient remains NPO w/ sips with meds and ice chips.    Problem: Education: Goal: Knowledge of General Education information will improve Description: Including pain rating scale, medication(s)/side effects and non-pharmacologic comfort measures Outcome: Not Progressing   Problem: Health Behavior/Discharge Planning: Goal: Ability to manage health-related needs will improve Outcome: Not Progressing   Problem: Clinical Measurements: Goal: Ability to maintain clinical measurements within normal limits will improve Outcome: Not Progressing Goal: Will remain free from infection Outcome: Not Progressing Goal: Diagnostic test results will improve Outcome: Not Progressing Goal: Respiratory complications will improve Outcome: Not Progressing Goal: Cardiovascular complication will be avoided Outcome: Not Progressing   Problem: Activity: Goal: Risk for activity intolerance will decrease Outcome: Not Progressing   Problem: Nutrition: Goal: Adequate nutrition will be maintained Outcome: Not Progressing   Problem: Coping: Goal: Level of anxiety will decrease Outcome: Not Progressing   Problem: Elimination: Goal: Will not experience complications related to bowel motility Outcome: Not Progressing Goal: Will not experience complications related to urinary retention Outcome: Not Progressing   Problem: Pain Managment: Goal: General experience of comfort will improve and/or be controlled Outcome: Not Progressing   Problem: Safety: Goal: Ability to remain free from injury will improve Outcome: Not Progressing   Problem: Skin Integrity: Goal: Risk for impaired skin integrity will decrease Outcome: Not Progressing

## 2024-05-23 NOTE — Progress Notes (Signed)
 Progress Note   Patient: Gary [name] FMW:[id number] DOB: April 30, 1986 DOA: 05/21/2024     2 DOS: the patient was seen and examined on 05/23/2024   Brief hospital course: Taken from H&P.  Gary [name] is a 38 y.o. male with medical history significant of homeless, polysubstance abuse, tobacco abuse, alcohol  abuse, alcoholic pancreatitis, DT, alcoholic withdrawal seizures, GERD, depression, anxiety, bipolar, PTSD, subdural hematoma, HIV, portal vein thrombosis, gastritis, duodenitis, who presents with nausea, vomiting, abdominal pain, shaking.   Patient has history of polysubstance abuse and alcohol  abuse. He has had frequent admission due to alcohol  withdrawal and alcoholic pancreatitis.  He was admitted again on 7/10 due to alcoholic pancreatitis, he was discharged earlier in the morning at his own request when he was threatening to leave AMA, he promised at that time that he will not drink and was supposed to go to his brother's home.  Case was discussed with brother to supervise as he was given 10 pills of Librium  to help with his withdrawal.  His CIWA score was 4.  Patient went home and started drinking, developed another episode of abdominal pain with nausea and vomiting which prompted another admission.  Patient comes to ED very frequently and multiple admissions for similar reasons.  On presentation found to have lipase of 184, alcohol  levels 106, vital stable.  Repeat CT abdomen during current admission with following 1. Interval enlargement of the acute peripancreatic inflammatory fluid collection adjacent to the head of the pancreas and coursing along the portal triad now measuring at least 3.0 x 5.3 cm. There is marked, progressive narrowing of the proximal portal vein, with the vessel now measuring 1 mm in diameter at its narrowest segment. There is also severe narrowing of the intrahepatic inferior vena cava, progressive since prior examination but of unclear  etiology. Interval development of multiple periduodenal and peribiliary portal venous collaterals. 2. Moderate hepatic steatosis. 3. Interval development of trace ascites.  Patient was started on phenobarbital  taper for alcohol  withdrawal and again admitted for supportive care.  7/14: Vital stable, CTA abdomen was ordered to rule out any thrombosis with concerning features on CT abdomen.  Troponin was checked overnight for concern of chest pain and it remained negative.  Patient becoming very agitated when tried to consult and confront that he did not did anything what he promised yesterday before discharge.  He was just keep asking for more pain medications and going back and forth between yesterday's conversation stating that we are just not controlling his pain.  Patient will remain high risk for morbidity, mortality and readmissions based on his behavior and continuation of alcohol  abuse.  7/15: Hemodynamically stable, appears comfortable but keep asking for more and more Dilaudid -Dilaudid  duration was increased to 6-hour as needed.  Starting on clear liquid diet. He was keep asking for hourly Ativan  and Dilaudid  only.  CIWA score remains 0. CT abdomen was negative for any vascular abnormality   Assessment and Plan: * Acute on chronic pancreatitis [facility]) Multiple hospitalization most recent was 2 days ago and he was discharged yesterday morning at his request otherwise he was threatening to leave AMA, apparently went out and started drinking and came back with worsening abdominal pain.  This time lipase was elevated to 184 and repeat CT abdomen and pelvis with increased inflammation and decreased vessel caliber. - CT abdomen was negative for any vascular abnormality -Patient will remain high risk due to his behavior -Continue with supportive care  Alcohol  withdrawal (HCC) Ongoing issue even when  he said that he wants to quit.  Had a lengthy discussion with brother and family is also  struggling with this behavior.  Counseling was again provided but he was becoming very agitated. - Continue CIWA protocol-we will try avoiding whole lot of Ativan  as CIWA score remained low and he was keep asking for more -Continue with phenobarbital  taper  HIV (human immunodeficiency virus infection) (HCC) Viral load less than 20 on 01/28/2024, CD4 was 598 on 12/08/23. Pt states that he lost his Biktarvy  - Resume Biktarvy   Hypercalcemia Resolved with hydration. - Continue to monitor  Homelessness Patient was discharged to his brother's home after talking with brother but apparently he never went there and went to his company where he started drinking. - TOC was consulted  GERD (gastroesophageal reflux disease) - Continue with PPI  Polysubstance abuse (HCC) UDS was ordered on admission-still pending. Patient denies any other illicit drug except alcohol  and smoking  Pancreatic lesion Per previous discharge summary, patient has prior imaging and recent concern of small progression of a heterogeneous area in pancreatic head, likely due to persistent pancreatitis but need to rule out malignancy as he is high risk. Patient will need a pancreatic dedicated MRI in 3 to 4 weeks. - CA 19-9 was ordered in previous admission, with result pending  Tobacco use disorder - Nicotine  patch as needed  Bipolar 2 disorder (HCC) Patient has stopped taking Seroquel  -As needed Ativan    Subjective: Patient was lying comfortably when I entered the room.  Keeps saying that he has 10 out of 10 pain.  When distracted there was no tenderness.  Physical Exam: Vitals:   05/23/24 0348 05/23/24 0500 05/23/24 0751 05/23/24 1105  BP: 107/71 104/78 110/75 102/75  Pulse: (!) 58 (!) 54 68 65  Resp: 19  17 19   Temp: (!) 97.5 F (36.4 C)  97.8 F (36.6 C) 98.7 F (37.1 C)  TempSrc: Oral     SpO2: 98%  100% 100%  Weight:  63.8 kg    Height:       General.  Malnourished gentleman, in no acute  distress. Pulmonary.  Lungs clear bilaterally, normal respiratory effort. CV.  Regular rate and rhythm, no JVD, rub or murmur. Abdomen.  Soft, nontender, nondistended, BS positive. CNS.  Alert and oriented .  No focal neurologic deficit. Extremities.  No edema, no cyanosis, pulses intact and symmetrical. Psychiatry.  Judgment and insight appears normal.   Data Reviewed: Prior data reviewed  Family Communication: Discussed with patient  Disposition: Status is: Inpatient Remains inpatient appropriate because: Severity of illness  Planned Discharge Destination: Home  Time spent: 45 minutes  This record has been created using Conservation officer, historic buildings. Errors have been sought and corrected,but may not always be located. Such creation errors do not reflect on the standard of care.   Author: Amaryllis Dare, MD 05/23/2024 12:47 PM  For on call review [email or web address].

## 2024-05-23 NOTE — Assessment & Plan Note (Signed)
 Ongoing issue even when he said that he wants to quit.  Had a lengthy discussion with brother and family is also struggling with this behavior.  Counseling was again provided but he was becoming very agitated. - Continue CIWA protocol-we will try avoiding whole lot of Ativan  as CIWA score remained low and he was keep asking for more -Continue with phenobarbital  taper

## 2024-05-23 NOTE — Assessment & Plan Note (Signed)
 Multiple hospitalization most recent was 2 days ago and he was discharged yesterday morning at his request otherwise he was threatening to leave AMA, apparently went out and started drinking and came back with worsening abdominal pain.  This time lipase was elevated to 184 and repeat CT abdomen and pelvis with increased inflammation and decreased vessel caliber. - CTA abdomen was negative for any vascular abnormality -Patient will remain high risk due to his behavior -Continue with supportive care - Creon  dose was increased

## 2024-05-23 NOTE — Plan of Care (Addendum)
 The patient remains on AR-2A as of time of writing. The patient is AA+Ox4. CIWA assessment q 6 hours. The patient is not requiring supplemental O2 or telemetry. The patient expresses concerns regarding on-going pain control; PRN meds given per orders. Patient's concern regarding pain control is escalated to Dr. Lawence via secure chat at 2115 hours; see new orders at 2137 hours.     Problem: Education: Goal: Knowledge of General Education information will improve Description: Including pain rating scale, medication(s)/side effects and non-pharmacologic comfort measures Outcome: Progressing   Problem: Health Behavior/Discharge Planning: Goal: Ability to manage health-related needs will improve Outcome: Progressing   Problem: Clinical Measurements: Goal: Ability to maintain clinical measurements within normal limits will improve Outcome: Progressing Goal: Will remain free from infection Outcome: Progressing Goal: Diagnostic test results will improve Outcome: Progressing Goal: Respiratory complications will improve Outcome: Progressing Goal: Cardiovascular complication will be avoided Outcome: Progressing   Problem: Activity: Goal: Risk for activity intolerance will decrease Outcome: Progressing   Problem: Nutrition: Goal: Adequate nutrition will be maintained Outcome: Progressing   Problem: Coping: Goal: Level of anxiety will decrease Outcome: Progressing   Problem: Elimination: Goal: Will not experience complications related to bowel motility Outcome: Progressing Goal: Will not experience complications related to urinary retention Outcome: Progressing   Problem: Pain Managment: Goal: General experience of comfort will improve and/or be controlled Outcome: Progressing   Problem: Safety: Goal: Ability to remain free from injury will improve Outcome: Progressing   Problem: Skin Integrity: Goal: Risk for impaired skin integrity will decrease Outcome: Progressing

## 2024-05-23 NOTE — Plan of Care (Signed)
  Problem: Clinical Measurements: Goal: Will remain free from infection Outcome: Progressing   Problem: Nutrition: Goal: Adequate nutrition will be maintained Outcome: Progressing   Problem: Elimination: Goal: Will not experience complications related to urinary retention Outcome: Progressing   Problem: Skin Integrity: Goal: Risk for impaired skin integrity will decrease Outcome: Progressing

## 2024-05-24 DIAGNOSIS — F10939 Alcohol use, unspecified with withdrawal, unspecified: Secondary | ICD-10-CM | POA: Diagnosis not present

## 2024-05-24 DIAGNOSIS — K859 Acute pancreatitis without necrosis or infection, unspecified: Secondary | ICD-10-CM | POA: Diagnosis not present

## 2024-05-24 DIAGNOSIS — Z21 Asymptomatic human immunodeficiency virus [HIV] infection status: Secondary | ICD-10-CM | POA: Diagnosis not present

## 2024-05-24 DIAGNOSIS — K219 Gastro-esophageal reflux disease without esophagitis: Secondary | ICD-10-CM | POA: Diagnosis not present

## 2024-05-24 LAB — GLUCOSE, CAPILLARY: Glucose-Capillary: 97 mg/dL (ref 70–99)

## 2024-05-24 MED ORDER — LACTATED RINGERS IV SOLN: INTRAVENOUS | Status: AC

## 2024-05-24 MED ORDER — PHENOBARBITAL 32.4 MG PO TABS
64.8000 mg | ORAL_TABLET | Freq: Three times a day (TID) | ORAL | Status: AC
  Administered 2024-05-24 – 2024-05-25 (×5): 64.8 mg via ORAL
  Filled 2024-05-24 (×5): qty 2

## 2024-05-24 MED ORDER — PHENOBARBITAL 32.4 MG PO TABS
32.4000 mg | ORAL_TABLET | Freq: Three times a day (TID) | ORAL | Status: DC
  Administered 2024-05-26: 32.4 mg via ORAL
  Filled 2024-05-24: qty 1

## 2024-05-24 NOTE — Plan of Care (Signed)
  Problem: Clinical Measurements: Goal: Ability to maintain clinical measurements within normal limits will improve Outcome: Progressing   Problem: Activity: Goal: Risk for activity intolerance will decrease Outcome: Progressing   Problem: Pain Managment: Goal: General experience of comfort will improve and/or be controlled Outcome: Progressing   Problem: Safety: Goal: Ability to remain free from injury will improve Outcome: Progressing

## 2024-05-24 NOTE — Progress Notes (Signed)
 Progress Note   Patient: Gary [name] FMW:[id number] DOB: 1986/02/16 DOA: 05/21/2024     3 DOS: the patient was seen and examined on 05/24/2024   Brief hospital course: Taken from H&P.  Gary [name] is a 38 y.o. male with medical history significant of homeless, polysubstance abuse, tobacco abuse, alcohol  abuse, alcoholic pancreatitis, DT, alcoholic withdrawal seizures, GERD, depression, anxiety, bipolar, PTSD, subdural hematoma, HIV, portal vein thrombosis, gastritis, duodenitis, who presents with nausea, vomiting, abdominal pain, shaking.   Patient has history of polysubstance abuse and alcohol  abuse. He has had frequent admission due to alcohol  withdrawal and alcoholic pancreatitis.  He was admitted again on 7/10 due to alcoholic pancreatitis, he was discharged earlier in the morning at his own request when he was threatening to leave AMA, he promised at that time that he will not drink and was supposed to go to his brother's home.  Case was discussed with brother to supervise as he was given 10 pills of Librium  to help with his withdrawal.  His CIWA score was 4.  Patient went home and started drinking, developed another episode of abdominal pain with nausea and vomiting which prompted another admission.  Patient comes to ED very frequently and multiple admissions for similar reasons.  On presentation found to have lipase of 184, alcohol  levels 106, vital stable.  Repeat CT abdomen during current admission with following 1. Interval enlargement of the acute peripancreatic inflammatory fluid collection adjacent to the head of the pancreas and coursing along the portal triad now measuring at least 3.0 x 5.3 cm. There is marked, progressive narrowing of the proximal portal vein, with the vessel now measuring 1 mm in diameter at its narrowest segment. There is also severe narrowing of the intrahepatic inferior vena cava, progressive since prior examination but of unclear  etiology. Interval development of multiple periduodenal and peribiliary portal venous collaterals. 2. Moderate hepatic steatosis. 3. Interval development of trace ascites.  Patient was started on phenobarbital  taper for alcohol  withdrawal and again admitted for supportive care.  7/14: Vital stable, CTA abdomen was ordered to rule out any thrombosis with concerning features on CT abdomen.  Troponin was checked overnight for concern of chest pain and it remained negative.  Patient becoming very agitated when tried to consult and confront that he did not did anything what he promised yesterday before discharge.  He was just keep asking for more pain medications and going back and forth between yesterday's conversation stating that we are just not controlling his pain.  Patient will remain high risk for morbidity, mortality and readmissions based on his behavior and continuation of alcohol  abuse.  7/15: Hemodynamically stable, appears comfortable but keep asking for more and more Dilaudid -Dilaudid  duration was increased to 6-hour as needed.  Starting on clear liquid diet. He was keep asking for hourly Ativan  and Dilaudid  only.  CIWA score remains 0. CT abdomen was negative for any vascular abnormality.  7/16: Hemodynamically stable, keep asking for more Dilaudid .  Toradol  was ordered.  CIWA score of 4.  Diet advanced to soft   Assessment and Plan: * Acute on chronic pancreatitis [facility]) Multiple hospitalization most recent was 2 days ago and he was discharged yesterday morning at his request otherwise he was threatening to leave AMA, apparently went out and started drinking and came back with worsening abdominal pain.  This time lipase was elevated to 184 and repeat CT abdomen and pelvis with increased inflammation and decreased vessel caliber. - CTA abdomen was negative for any  vascular abnormality -Patient will remain high risk due to his behavior -Continue with supportive care  Alcohol   withdrawal (HCC) Ongoing issue even when he said that he wants to quit.  Had a lengthy discussion with brother and family is also struggling with this behavior.  Counseling was again provided but he was becoming very agitated. - Continue CIWA protocol-we will try avoiding whole lot of Ativan  as CIWA score remained low and he was keep asking for more -Continue with phenobarbital  taper  HIV (human immunodeficiency virus infection) (HCC) Viral load less than 20 on 01/28/2024, CD4 was 598 on 12/08/23. Pt states that he lost his Biktarvy  - Resume Biktarvy   Hypercalcemia Resolved with hydration. - Continue to monitor  Homelessness Patient was discharged to his brother's home after talking with brother but apparently he never went there and went to his company where he started drinking. - TOC was consulted  GERD (gastroesophageal reflux disease) - Continue with PPI  Polysubstance abuse (HCC) UDS was ordered on admission-still pending. Patient denies any other illicit drug except alcohol  and smoking  Pancreatic lesion Per previous discharge summary, patient has prior imaging and recent concern of small progression of a heterogeneous area in pancreatic head, likely due to persistent pancreatitis but need to rule out malignancy as he is high risk. Patient will need a pancreatic dedicated MRI in 3 to 4 weeks. - CA 19-9 was ordered in previous admission, with result pending  Tobacco use disorder - Nicotine  patch as needed  Bipolar 2 disorder (HCC) Patient has stopped taking Seroquel  -As needed Ativan    Subjective: Patient was sitting comfortably when seen today, still complaining of 10 out of 10 pain and asking for more Dilaudid .  Per patient his appetite is still poor.  Physical Exam: Vitals:   05/24/24 0840 05/24/24 0847 05/24/24 1109 05/24/24 1149  BP: 95/79 95/79 (!) 96/59 (!) 96/59  Pulse: 63 63 63 63  Resp: 18  18   Temp: 97.6 F (36.4 C)  97.8 F (36.6 C)   TempSrc: Oral      SpO2: 100%  100%   Weight:      Height:       General.  Malnourished gentleman, in no acute distress. Pulmonary.  Lungs clear bilaterally, normal respiratory effort. CV.  Regular rate and rhythm, no JVD, rub or murmur. Abdomen.  Soft, nontender, nondistended, BS positive. CNS.  Alert and oriented .  No focal neurologic deficit. Extremities.  No edema, no cyanosis, pulses intact and symmetrical.  Data Reviewed: Prior data reviewed  Family Communication: Discussed with patient  Disposition: Status is: Inpatient Remains inpatient appropriate because: Severity of illness  Planned Discharge Destination: Home  Time spent: 44 minutes  This record has been created using Conservation officer, historic buildings. Errors have been sought and corrected,but may not always be located. Such creation errors do not reflect on the standard of care.   Author: Amaryllis Dare, MD 05/24/2024 2:51 PM  For on call review [email or web address].

## 2024-05-24 NOTE — Plan of Care (Signed)
   Problem: Education: Goal: Knowledge of General Education information will improve Description: Including pain rating scale, medication(s)/side effects and non-pharmacologic comfort measures Outcome: Progressing   Problem: Clinical Measurements: Goal: Respiratory complications will improve Outcome: Progressing

## 2024-05-25 DIAGNOSIS — K219 Gastro-esophageal reflux disease without esophagitis: Secondary | ICD-10-CM | POA: Diagnosis not present

## 2024-05-25 DIAGNOSIS — Z21 Asymptomatic human immunodeficiency virus [HIV] infection status: Secondary | ICD-10-CM | POA: Diagnosis not present

## 2024-05-25 DIAGNOSIS — F10939 Alcohol use, unspecified with withdrawal, unspecified: Secondary | ICD-10-CM | POA: Diagnosis not present

## 2024-05-25 DIAGNOSIS — K859 Acute pancreatitis without necrosis or infection, unspecified: Secondary | ICD-10-CM | POA: Diagnosis not present

## 2024-05-25 LAB — GLUCOSE, CAPILLARY
Glucose-Capillary: 96 mg/dL (ref 70–99)
Glucose-Capillary: 81 mg/dL (ref 70–99)
Glucose-Capillary: 136 mg/dL — ABNORMAL HIGH (ref 70–99)
Glucose-Capillary: 150 mg/dL — ABNORMAL HIGH (ref 70–99)

## 2024-05-25 MED ORDER — KETOROLAC TROMETHAMINE 15 MG/ML IJ SOLN
30.0000 mg | Freq: Four times a day (QID) | INTRAMUSCULAR | Status: DC | PRN
  Administered 2024-05-25: 30 mg via INTRAVENOUS
  Filled 2024-05-25: qty 2

## 2024-05-25 MED ORDER — PANCRELIPASE (LIP-PROT-AMYL) 12000-38000 UNITS PO CPEP
36000.0000 [IU] | ORAL_CAPSULE | Freq: Three times a day (TID) | ORAL | Status: DC
  Administered 2024-05-25 – 2024-05-26 (×3): 36000 [IU] via ORAL
  Filled 2024-05-25 (×3): qty 3

## 2024-05-25 MED ORDER — LACTATED RINGERS IV BOLUS
1000.0000 mL | Freq: Once | INTRAVENOUS | Status: AC
  Administered 2024-05-25: 1000 mL via INTRAVENOUS

## 2024-05-25 NOTE — Progress Notes (Signed)
 Progress Note   Patient: Gary [name] FMW:[id number] DOB: August 24, 1986 DOA: 05/21/2024     4 DOS: the patient was seen and examined on 05/25/2024   Brief hospital course: Taken from H&P.  TREVEN HOLTMAN is a 38 y.o. male with medical history significant of homeless, polysubstance abuse, tobacco abuse, alcohol  abuse, alcoholic pancreatitis, DT, alcoholic withdrawal seizures, GERD, depression, anxiety, bipolar, PTSD, subdural hematoma, HIV, portal vein thrombosis, gastritis, duodenitis, who presents with nausea, vomiting, abdominal pain, shaking.   Patient has history of polysubstance abuse and alcohol  abuse. He has had frequent admission due to alcohol  withdrawal and alcoholic pancreatitis.  He was admitted again on 7/10 due to alcoholic pancreatitis, he was discharged earlier in the morning at his own request when he was threatening to leave AMA, he promised at that time that he will not drink and was supposed to go to his brother's home.  Case was discussed with brother to supervise as he was given 10 pills of Librium  to help with his withdrawal.  His CIWA score was 4.  Patient went home and started drinking, developed another episode of abdominal pain with nausea and vomiting which prompted another admission.  Patient comes to ED very frequently and multiple admissions for similar reasons.  On presentation found to have lipase of 184, alcohol  levels 106, vital stable.  Repeat CT abdomen during current admission with following 1. Interval enlargement of the acute peripancreatic inflammatory fluid collection adjacent to the head of the pancreas and coursing along the portal triad now measuring at least 3.0 x 5.3 cm. There is marked, progressive narrowing of the proximal portal vein, with the vessel now measuring 1 mm in diameter at its narrowest segment. There is also severe narrowing of the intrahepatic inferior vena cava, progressive since prior examination but of unclear  etiology. Interval development of multiple periduodenal and peribiliary portal venous collaterals. 2. Moderate hepatic steatosis. 3. Interval development of trace ascites.  Patient was started on phenobarbital  taper for alcohol  withdrawal and again admitted for supportive care.  7/14: Vital stable, CTA abdomen was ordered to rule out any thrombosis with concerning features on CT abdomen.  Troponin was checked overnight for concern of chest pain and it remained negative.  Patient becoming very agitated when tried to consult and confront that he did not did anything what he promised yesterday before discharge.  He was just keep asking for more pain medications and going back and forth between yesterday's conversation stating that we are just not controlling his pain.  Patient will remain high risk for morbidity, mortality and readmissions based on his behavior and continuation of alcohol  abuse.  7/15: Hemodynamically stable, appears comfortable but keep asking for more and more Dilaudid -Dilaudid  duration was increased to 6-hour as needed.  Starting on clear liquid diet. He was keep asking for hourly Ativan  and Dilaudid  only.  CIWA score remains 0. CT abdomen was negative for any vascular abnormality.  7/16: Hemodynamically stable, keep asking for more Dilaudid .  Toradol  was ordered.  CIWA score of 4.  Diet advanced to soft.  7/17: Vital stable, CIWA score of 3, continue to complain about significant pain which increased with food-Creon  and Toradol  dose was increased.   Assessment and Plan: * Acute on chronic pancreatitis [facility]) Multiple hospitalization most recent was 2 days ago and he was discharged yesterday morning at his request otherwise he was threatening to leave AMA, apparently went out and started drinking and came back with worsening abdominal pain.  This time lipase was  elevated to 184 and repeat CT abdomen and pelvis with increased inflammation and decreased vessel caliber. - CTA  abdomen was negative for any vascular abnormality -Patient will remain high risk due to his behavior -Continue with supportive care - Creon  dose was increased  Alcohol  withdrawal (HCC) Ongoing issue even when he said that he wants to quit.  Had a lengthy discussion with brother and family is also struggling with this behavior.  Counseling was again provided but he was becoming very agitated. - Continue CIWA protocol-we will try avoiding whole lot of Ativan  as CIWA score remained low and he was keep asking for more -Continue with phenobarbital  taper  HIV (human immunodeficiency virus infection) (HCC) Viral load less than 20 on 01/28/2024, CD4 was 598 on 12/08/23. Pt states that he lost his Biktarvy  - Resume Biktarvy   Hypercalcemia Resolved with hydration. - Continue to monitor  Homelessness Patient was discharged to his brother's home after talking with brother but apparently he never went there and went to his company where he started drinking. - TOC was consulted  GERD (gastroesophageal reflux disease) - Continue with PPI  Polysubstance abuse (HCC) UDS was ordered on admission-still pending. Patient denies any other illicit drug except alcohol  and smoking  Pancreatic lesion Per previous discharge summary, patient has prior imaging and recent concern of small progression of a heterogeneous area in pancreatic head, likely due to persistent pancreatitis but need to rule out malignancy as he is high risk. Patient will need a pancreatic dedicated MRI in 3 to 4 weeks. - CA 19-9 was ordered in previous admission, with result pending  Tobacco use disorder - Nicotine  patch as needed  Bipolar 2 disorder (HCC) Patient has stopped taking Seroquel  -As needed Ativan    Subjective: Patient was sitting in bed when seen today.  Complaining of significant abdominal pain which increased with any food.  Appetite improving.  Physical Exam: Vitals:   05/25/24 0514 05/25/24 0800 05/25/24 1209  05/25/24 1532  BP: 99/77 95/70 94/72  97/66  Pulse: 68 68 77 62  Resp:  16 18 18   Temp:  97.8 F (36.6 C) 98.1 F (36.7 C) 98.2 F (36.8 C)  TempSrc:  Oral  Oral  SpO2:  97% 98% 100%  Weight:      Height:       General.  Malnourished gentleman, in no acute distress. Pulmonary.  Lungs clear bilaterally, normal respiratory effort. CV.  Regular rate and rhythm, no JVD, rub or murmur. Abdomen.  Soft, nontender, nondistended, BS positive. CNS.  Alert and oriented .  No focal neurologic deficit. Extremities.  No edema,  pulses intact and symmetrical. Psychiatry.  Judgment and insight appears normal.   Data Reviewed: Prior data reviewed  Family Communication: Discussed with patient  Disposition: Status is: Inpatient Remains inpatient appropriate because: Severity of illness  Planned Discharge Destination: Home  Time spent: 45 minutes  This record has been created using Conservation officer, historic buildings. Errors have been sought and corrected,but may not always be located. Such creation errors do not reflect on the standard of care.   Author: Amaryllis Dare, MD 05/25/2024 5:11 PM  For on call review [email or web address].

## 2024-05-26 ENCOUNTER — Other Ambulatory Visit: Payer: Self-pay

## 2024-05-26 DIAGNOSIS — F10930 Alcohol use, unspecified with withdrawal, uncomplicated: Secondary | ICD-10-CM | POA: Diagnosis not present

## 2024-05-26 DIAGNOSIS — K852 Alcohol induced acute pancreatitis without necrosis or infection: Secondary | ICD-10-CM

## 2024-05-26 DIAGNOSIS — R112 Nausea with vomiting, unspecified: Secondary | ICD-10-CM

## 2024-05-26 DIAGNOSIS — K859 Acute pancreatitis without necrosis or infection, unspecified: Secondary | ICD-10-CM | POA: Diagnosis not present

## 2024-05-26 LAB — COMPREHENSIVE METABOLIC PANEL WITH GFR
ALT: 16 U/L (ref 0–44)
AST: 19 U/L (ref 15–41)
Albumin: 3.3 g/dL — ABNORMAL LOW (ref 3.5–5.0)
Alkaline Phosphatase: 54 U/L (ref 38–126)
Anion gap: 7 (ref 5–15)
BUN: 11 mg/dL (ref 6–20)
CO2: 27 mmol/L (ref 22–32)
Calcium: 9.2 mg/dL (ref 8.9–10.3)
Chloride: 101 mmol/L (ref 98–111)
Creatinine, Ser: 0.38 mg/dL — ABNORMAL LOW (ref 0.61–1.24)
GFR, Estimated: >60 mL/min (ref >60)
Glucose, Bld: 109 mg/dL — ABNORMAL HIGH (ref 70–99)
Potassium: 3.7 mmol/L (ref 3.5–5.1)
Sodium: 135 mmol/L (ref 135–145)
Total Bilirubin: 0.3 mg/dL (ref 0.0–1.2)
Total Protein: 6.6 g/dL (ref 6.5–8.1)

## 2024-05-26 LAB — GLUCOSE, CAPILLARY: Glucose-Capillary: 108 mg/dL — ABNORMAL HIGH (ref 70–99)

## 2024-05-26 LAB — LIPASE, BLOOD: Lipase: 46 U/L (ref 11–51)

## 2024-05-26 MED ORDER — PANCRELIPASE (LIP-PROT-AMYL) 36000-114000 UNITS PO CPEP
36000.0000 [IU] | ORAL_CAPSULE | Freq: Three times a day (TID) | ORAL | 2 refills | Status: DC
  Filled 2024-05-26: qty 100, 34d supply, fill #0

## 2024-05-26 MED ORDER — ADULT MULTIVITAMIN W/MINERALS CH: 1.0000 | ORAL_TABLET | Freq: Every day | ORAL | 2 refills | Status: DC

## 2024-05-26 MED ORDER — ENSURE PLUS HIGH PROTEIN PO LIQD: 237.0000 mL | Freq: Two times a day (BID) | ORAL | 2 refills | Status: DC

## 2024-05-26 MED ORDER — FOLIC ACID 1 MG PO TABS
1.0000 mg | ORAL_TABLET | Freq: Every day | ORAL | 1 refills | Status: DC
  Filled 2024-05-26: qty 90, 90d supply, fill #0

## 2024-05-26 MED ORDER — PHENOBARBITAL 32.4 MG PO TABS
32.4000 mg | ORAL_TABLET | Freq: Three times a day (TID) | ORAL | 0 refills | Status: DC
  Filled 2024-05-26: qty 6, 2d supply, fill #0

## 2024-05-26 MED ORDER — VITAMIN B-1 100 MG PO TABS
100.0000 mg | ORAL_TABLET | Freq: Every day | ORAL | 2 refills | Status: DC
  Filled 2024-05-26: qty 90, 90d supply, fill #0

## 2024-05-26 NOTE — Discharge Summary (Signed)
 Physician Discharge Summary   Patient: Gary [name] MRN: [id number] DOB: [birth year]  Admit date:     05/21/2024  Discharge date: 05/26/24  Discharge Physician: Amaryllis Dare   PCP: Pcp, No   Recommendations at discharge:  Please obtain CBC and CMP on follow-up Follow-up with primary care provider Follow-up with infectious disease Please obtain a pancreatic designated MRI in 1 to 2 months  Discharge Diagnoses: Principal Problem:   Acute on chronic pancreatitis (HCC) Active Problems:   Alcohol  withdrawal (HCC)   HIV (human immunodeficiency virus infection) (HCC)   Hypercalcemia   Homelessness   GERD (gastroesophageal reflux disease)   Polysubstance abuse (HCC)   Pancreatic lesion   Tobacco use disorder   Bipolar 2 disorder (HCC)   Depression with anxiety   Alcohol  abuse   Nausea and vomiting   Hospital Course: Taken from [facility].  Gary [name] is a 38 y.o. male with medical history significant of homeless, polysubstance abuse, tobacco abuse, alcohol  abuse, alcoholic pancreatitis, DT, alcoholic withdrawal seizures, GERD, depression, anxiety, bipolar, PTSD, subdural hematoma, HIV, portal vein thrombosis, gastritis, duodenitis, who presents with nausea, vomiting, abdominal pain, shaking.   Patient has history of polysubstance abuse and alcohol  abuse. He has had frequent admission due to alcohol  withdrawal and alcoholic pancreatitis.  He was admitted again on 7/10 due to alcoholic pancreatitis, he was discharged earlier in the morning at his own request when he was threatening to leave AMA, he promised at that time that he will not drink and was supposed to go to his brother's home.  Case was discussed with brother to supervise as he was given 10 pills of Librium  to help with his withdrawal.  His CIWA score was 4.  Patient went home and started drinking, developed another episode of abdominal pain with nausea and vomiting which prompted another admission.  Patient comes to ED very  frequently and multiple admissions for similar reasons.  On presentation found to have lipase of 184, alcohol  levels 106, vital stable.  Repeat CT abdomen during current admission with following 1. Interval enlargement of the acute peripancreatic inflammatory fluid collection adjacent to the head of the pancreas and coursing along the portal triad now measuring at least 3.0 x 5.3 cm. There is marked, progressive narrowing of the proximal portal vein, with the vessel now measuring 1 mm in diameter at its narrowest segment. There is also severe narrowing of the intrahepatic inferior vena cava, progressive since prior examination but of unclear etiology. Interval development of multiple periduodenal and peribiliary portal venous collaterals. 2. Moderate hepatic steatosis. 3. Interval development of trace ascites.  Patient was started on phenobarbital  taper for alcohol  withdrawal and again admitted for supportive care.  7/14: Vital stable, CTA abdomen was ordered to rule out any thrombosis with concerning features on CT abdomen.  Troponin was checked overnight for concern of chest pain and it remained negative.  Patient becoming very agitated when tried to consult and confront that he did not did anything what he promised yesterday before discharge.  He was just keep asking for more pain medications and going back and forth between yesterday's conversation stating that we are just not controlling his pain.  Patient will remain high risk for morbidity, mortality and readmissions based on his behavior and continuation of alcohol  abuse.  7/15: Hemodynamically stable, appears comfortable but keep asking for more and more Dilaudid -Dilaudid  duration was increased to 6-hour as needed.  Starting on clear liquid diet. He was keep asking for hourly Ativan  and Dilaudid   only.  CIWA score remains 0. CT abdomen was negative for any vascular abnormality.  7/16: Hemodynamically stable, keep asking for more  Dilaudid .  Toradol  was ordered.  CIWA score of 4.  Diet advanced to soft.  7/17: Vital stable, CIWA score of 3, continue to complain about significant pain which increased with food-Creon  and Toradol  dose was increased.  7/18: Hemodynamically stable, CIWA score of 0.  Abdominal pain with significant improvement with increased dose of Creon .  Patient wants to go home.  Patient was provided with prescriptions for Creon  and 6 doses of phenobarbital  taper.  Patient was counseled extensively again for alcohol  cessation.  Per patient he is going to his brother's home.  A taxi voucher was also provided.  Patient will continue on current medications and need to have a close follow-up with his providers.  Patient will remain high risk for readmission based on prior history and high risk behavior.  Assessment and Plan: * Acute on chronic pancreatitis [facility]) Multiple hospitalization most recent was 2 days ago and he was discharged yesterday morning at his request otherwise he was threatening to leave AMA, apparently went out and started drinking and came back with worsening abdominal pain.  This time lipase was elevated to 184 and repeat CT abdomen and pelvis with increased inflammation and decreased vessel caliber. - CTA abdomen was negative for any vascular abnormality -Patient will remain high risk due to his behavior -Continue with supportive care - Creon  dose was increased  Alcohol  withdrawal (HCC) Ongoing issue even when he said that he wants to quit.  Had a lengthy discussion with brother and family is also struggling with this behavior.  Counseling was again provided but he was becoming very agitated. - Continue CIWA protocol-we will try avoiding whole lot of Ativan  as CIWA score remained low and he was keep asking for more -Continue with phenobarbital  taper  HIV (human immunodeficiency virus infection) (HCC) Viral load less than 20 on 01/28/2024, CD4 was 598 on 12/08/23. Pt states that he lost  his Biktarvy  - Resume Biktarvy   Hypercalcemia Resolved with hydration. - Continue to monitor  Homelessness Patient was discharged to his brother's home after talking with brother but apparently he never went there and went to his company where he started drinking. - TOC was consulted - Per patient he is going to his brother's home  GERD (gastroesophageal reflux disease) - Continue with PPI  Polysubstance abuse (HCC) UDS was ordered on admission-still pending. Patient denies any other illicit drug except alcohol  and smoking  Pancreatic lesion Per previous discharge summary, patient has prior imaging and recent concern of small progression of a heterogeneous area in pancreatic head, likely due to persistent pancreatitis but need to rule out malignancy as he is high risk. Patient will need a pancreatic dedicated MRI in 3 to 4 weeks. - CA 19-9 was normal  Tobacco use disorder - Nicotine  patch as needed  Bipolar 2 disorder (HCC) Patient has stopped taking Seroquel  -As needed Ativan   Consultants: None Procedures performed: None Disposition: Home Diet recommendation:  Discharge Diet Orders (From admission, onward)     Start     Ordered   05/26/24 0000  Diet - low sodium heart healthy        05/26/24 1117           Regular diet DISCHARGE MEDICATION: Allergies as of 05/26/2024       Reactions   Risperidone  And Paliperidone Other (See Comments)   Patient states causes opposite effect (he will  not take again)   Tegretol  [carbamazepine ] Other (See Comments)   Vertigo  Paralysis   Caffeine Palpitations        Medication List     STOP taking these medications    chlordiazePOXIDE  25 MG capsule Commonly known as: LIBRIUM        TAKE these medications    Biktarvy  50-200-25 MG Tabs tablet Generic drug: bictegravir-emtricitabine -tenofovir  AF Take 1 tablet by mouth daily.   feeding supplement Liqd Take 237 mLs by mouth 2 (two) times daily between meals.    folic acid  1 MG tablet Commonly known as: FOLVITE  Take 1 tablet (1 mg total) by mouth daily. Start taking on: May 27, 2024   gabapentin  100 MG capsule Commonly known as: NEURONTIN  Take 2 capsules (200 mg total) by mouth 3 (three) times daily.   lipase/protease/amylase 63999 UNITS Cpep capsule Commonly known as: CREON  Take 1 capsule (36,000 Units total) by mouth 3 (three) times daily before meals. What changed:  medication strength how much to take   multivitamin with minerals Tabs tablet Take 1 tablet by mouth daily.   nicotine  14 mg/24hr patch Commonly known as: NICODERM CQ  - dosed in mg/24 hours Place 1 patch (14 mg total) onto the skin daily.   pantoprazole  40 MG tablet Commonly known as: PROTONIX  Take 1 tablet (40 mg total) by mouth 2 (two) times daily. What changed:  when to take this reasons to take this   PHENobarbital  32.4 MG tablet Commonly known as: LUMINAL Take 1 tablet (32.4 mg total) by mouth 3 (three) times daily for 6 doses.   QUEtiapine  50 MG tablet Commonly known as: SEROQUEL  Take 1 tablet (50 mg total) by mouth at bedtime.   thiamine  100 MG tablet Commonly known as: VITAMIN B1 Take 1 tablet (100 mg total) by mouth daily.        Discharge Exam: Filed Weights   05/23/24 0500 05/24/24 0347  Weight: 63.8 kg 61.4 kg   General.  Severely malnourished gentleman, in no acute distress. Pulmonary.  Lungs clear bilaterally, normal respiratory effort. CV.  Regular rate and rhythm, no JVD, rub or murmur. Abdomen.  Soft, nontender, nondistended, BS positive. CNS.  Alert and oriented .  No focal neurologic deficit. Extremities.  No edema,  pulses intact and symmetrical. Psychiatry.  Judgment and insight appears normal.   Condition at discharge: stable  The results of significant diagnostics from this hospitalization (including imaging, microbiology, ancillary and laboratory) are listed below for reference.   Imaging Studies: CT Angio Abd/Pel w/  and/or w/o Result Date: 05/22/2024 CLINICAL DATA:  Aneurysm, renal or visceral Rule out any thrombosis with recurrent pancreatitis and concern of portal hypertension. EXAM: CTA ABDOMEN AND PELVIS WITHOUT AND WITH CONTRAST TECHNIQUE: Multidetector CT imaging of the abdomen and pelvis was performed using the standard protocol during bolus administration of intravenous contrast. Multiplanar reconstructed images and MIPs were obtained and reviewed to evaluate the vascular anatomy. RADIATION DOSE REDUCTION: This exam was performed according to the departmental dose-optimization program which includes automated exposure control, adjustment of the mA and/or kV according to patient size and/or use of iterative reconstruction technique. CONTRAST:  80mL OMNIPAQUE  IOHEXOL  350 MG/ML SOLN COMPARISON:  CTA abdomen and pelvis from 05/08/2024. FINDINGS: VASCULAR Aorta: Normal caliber aorta without aneurysm, dissection, vasculitis or significant stenosis. Celiac: Patent without evidence of aneurysm, dissection, vasculitis or significant stenosis. SMA: Patent without evidence of aneurysm, dissection, vasculitis or significant stenosis. Renals: Both renal arteries are patent without evidence of aneurysm, dissection, vasculitis, fibromuscular dysplasia or  significant stenosis. IMA: Patent without evidence of aneurysm, dissection, vasculitis or significant stenosis. Inflow: Patent without evidence of aneurysm, dissection, vasculitis or significant stenosis. Proximal Outflow: Bilateral common femoral and visualized portions of the superficial and profunda femoral arteries are patent without evidence of aneurysm, dissection, vasculitis or significant stenosis. Veins: No obvious venous abnormality within the limitations of this arterial phase study. An ovoid enhancing lesion seen on the prior CT angiography from 05/08/2024 is not seen on this arterial phase images. And therefore an arterial aneurysm is excluded. However, this examination  is very limited for evaluation of venous varix. Please refer to prior CT scan abdomen and pelvis from 05/22/2024 and 05/19/2024, which were performed during the portal venous phase. Review of the MIP images confirms the above findings. NON-VASCULAR Lower chest: There are patchy atelectatic changes in the visualized lung bases. No overt consolidation. No pleural effusion. The heart is normal in size. No pericardial effusion. Hepatobiliary: The liver is normal in size. Non-cirrhotic configuration. No suspicious mass. No intrahepatic or extrahepatic bile duct dilation. There dependent hyperattenuating areas in the gallbladder, likely vicarious excretion of previously administered intravenous contrast versus sludge. No imaging evidence of acute cholecystitis. Normal gallbladder wall thickness. No pericholecystic inflammatory changes. Pancreas: Redemonstration of fat stranding surrounding the pancreatic head/uncinate process, compatible with history of acute pancreatitis. No associated peripancreatic walled-off abscess or collection seen. No suspicious pancreatic lesion. Main pancreatic duct is not dilated. Spleen: Within normal limits. No focal lesion. Adrenals/Urinary Tract: Adrenal glands are unremarkable. No suspicious renal mass. No hydronephrosis. No renal or ureteric calculi. Unremarkable urinary bladder. Stomach/Bowel: No disproportionate dilation of the small or large bowel loops. No evidence of abnormal bowel wall thickening or inflammatory changes. The appendix is unremarkable. Vascular/Lymphatic: No ascites or pneumoperitoneum. No abdominal or pelvic lymphadenopathy, by size criteria. Reproductive: Normal size prostate. Symmetric seminal vesicles. Other: There is a tiny fat containing umbilical hernia. The soft tissues and abdominal wall are otherwise unremarkable. Musculoskeletal: No suspicious osseous lesions. IMPRESSION: VASCULAR No dissection, aneurysm, vasculitis or other acute vascular abnormality.  *Previously seen ovoid enhancing lesion is not seen on this arterial phase images. Therefore, an arterial aneurysm is excluded. However, this examination is very limited for evaluation of venous varix. Please refer to prior CT scan abdomen and pelvis from 05/22/2024 and 05/19/2024, which were performed during the portal venous phase. NON-VASCULAR 1. Redemonstration of the fat stranding surrounding the pancreatic head/uncinate process, compatible with history of acute pancreatitis. No associated peripancreatic walled-off abscess or collection seen. 2. Multiple other nonacute observations, as described above. Electronically Signed   By: Ree Molt M.D.   On: 05/22/2024 14:03   CT ABDOMEN PELVIS W CONTRAST Result Date: 05/22/2024 CLINICAL DATA:  Acute severe pancreatitis, alcohol  withdrawal EXAM: CT ABDOMEN AND PELVIS WITH CONTRAST TECHNIQUE: Multidetector CT imaging of the abdomen and pelvis was performed using the standard protocol following bolus administration of intravenous contrast. RADIATION DOSE REDUCTION: This exam was performed according to the departmental dose-optimization program which includes automated exposure control, adjustment of the mA and/or kV according to patient size and/or use of iterative reconstruction technique. CONTRAST:  OMNIPAQUE  IOHEXOL  300 MG/ML  SOLN COMPARISON:  05/19/2024 FINDINGS: Lower chest: No acute abnormality. Hepatobiliary: Moderate hepatic steatosis. No enhancing intrahepatic mass. No intra or extrahepatic biliary ductal dilation. Gallbladder unremarkable. Interval development of multiple periduodenal and peribiliary portal venous collaterals. Pancreas: There is interval enlargement of the acute peripancreatic inflammatory fluid collection seen adjacent to the head of the pancreas and coursing along the portal  triad now measuring at least 3.0 x 5.3 cm (30/2). There is marked, progressive narrowing of the proximal portal vein best seen on coronal image 37/5, with  the vessel now measuring 1 mm in diameter at its narrowest segment. There is normal enhancement of the pancreatic parenchyma; no evidence of pancreatic parenchymal necrosis. Mild peripancreatic inflammatory changes are again identified surrounding the head of the pancreas. Spleen: Normal in size without focal abnormality. Adrenals/Urinary Tract: Adrenal glands are unremarkable. Kidneys are normal, without renal calculi, focal lesion, or hydronephrosis. Bladder is unremarkable. Stomach/Bowel: There is hyperemia involving the duodenum with mild periduodenal inflammatory stranding particularly involving the second third portion the duodenum related to the adjacent inflammatory process involving the head of the pancreas. No evidence of obstruction or perforation. Stomach, small bowel, and large bowel are otherwise unremarkable. Appendix normal. Interval development of trace ascites. Vascular/Lymphatic: See above. 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 The abdominal vasculature is otherwise unremarkable. Reproductive: Moderate prostatic hypertrophy. Other: No abdominal wall hernia Musculoskeletal: No acute or significant osseous findings. IMPRESSION: 1. Interval enlargement of the acute peripancreatic inflammatory fluid collection adjacent to the head of the pancreas and coursing along the portal triad now measuring at least 3.0 x 5.3 cm. There is marked, progressive narrowing of the proximal portal vein, with the vessel now measuring 1 mm in diameter at its narrowest segment. There is also severe narrowing of the intrahepatic inferior vena cava, progressive since prior examination but of unclear etiology. Interval development of multiple periduodenal and peribiliary portal venous collaterals. 2. Moderate hepatic steatosis. 3. Interval development of trace ascites. Electronically Signed   By: Dorethia Molt M.D.   On: 05/22/2024 00:25    CT ABDOMEN PELVIS W CONTRAST Result Date: 05/19/2024 CLINICAL DATA:  Acute severe pancreatitis.  Alcohol  intoxication. EXAM: CT ABDOMEN AND PELVIS WITH CONTRAST TECHNIQUE: Multidetector CT imaging of the abdomen and pelvis was performed using the standard protocol following bolus administration of intravenous contrast. RADIATION DOSE REDUCTION: This exam was performed according to the departmental dose-optimization program which includes automated exposure control, adjustment of the mA and/or kV according to patient size and/or use of iterative reconstruction technique. CONTRAST:  100mL OMNIPAQUE  IOHEXOL  300 MG/ML  SOLN COMPARISON:  05/08/2024 FINDINGS: Lower chest: Mild dependent changes in the lung bases. Hepatobiliary: Mild diffuse fatty infiltration of the liver. No gallstones, gallbladder wall thickening, or biliary dilatation. Pancreas: Heterogeneous enlargement of the pancreatic head and uncinate process with stranding in the surrounding peripancreatic fat consistent with history of acute pancreatitis. No pancreatic ductal dilatation. Inflammatory changes are increased since the prior study suggesting progression. Heterogeneous low-attenuation changes in the pancreatic head consistent with focal pancreatic necrosis. No walled-off collections are identified. 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 Spleen: Normal in size without focal abnormality. Adrenals/Urinary Tract: Adrenal glands are unremarkable. Kidneys are normal, without renal calculi, focal lesion, or hydronephrosis. Bladder is unremarkable. Stomach/Bowel: Stomach, small bowel, and colon are not abnormally distended. No wall thickening or inflammatory stranding. Scattered stool throughout the colon. Appendix is normal. Vascular/Lymphatic: No significant vascular findings are present. No enlarged abdominal or pelvic lymph nodes. Reproductive: Prostate gland is enlarged. Other: No  abdominal wall hernia or abnormality. No abdominopelvic ascites. Musculoskeletal: No acute or significant osseous findings. IMPRESSION: 1. Progressing changes of acute pancreatitis in the head and uncinate process of the pancreas. Increased pancreatic swelling with peripheral edema around the pancreatic head  and developing areas of low-attenuation suggesting developing focal necrosis. No walled-off collections. 2. 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 3. Fatty infiltration of the liver. 4. Enlarged prostate gland. Electronically Signed   By: Elsie Gravely M.D.   On: 05/19/2024 03:11   CT Angio Abd/Pel W and/or Wo Contrast Result Date: 05/08/2024 CLINICAL DATA:  Pancreatitis, concern for pseudoaneurysm EXAM: CTA ABDOMEN AND PELVIS WITHOUT AND WITH CONTRAST TECHNIQUE: Multidetector CT imaging of the abdomen and pelvis was performed using the standard protocol during bolus administration of intravenous contrast. Multiplanar reconstructed images and MIPs were obtained and reviewed to evaluate the vascular anatomy. RADIATION DOSE REDUCTION: This exam was performed according to the departmental dose-optimization program which includes automated exposure control, adjustment of the mA and/or kV according to patient size and/or use of iterative reconstruction technique. CONTRAST:  75mL OMNIPAQUE  IOHEXOL  350 MG/ML SOLN COMPARISON:  Same-day CT abdomen pelvis, 05/08/2024 FINDINGS: VASCULAR Angiographic examination is limited by significantly late (portal venous) phase of contrast submitted for review as well as contamination from previously administered contrast and finally streak artifact from patient arm positioning. Within this limitation, there is an enhancing ovoid lesion centered in the posterior pancreatic head, underlying the portal confluence and measuring 1.6 x 1.4 cm (series 18, image 21). 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 Normal contour and caliber of the abdominal aorta. No evidence of aneurysm, dissection, or other acute aortic pathology. Standard branching pattern of the abdominal aorta with solitary bilateral renal arteries. Focal effacement of the most central portion of the portal vein at its confluence (series 18, image 20). Early cavernous transformation. Small varices throughout the left upper quadrant (series 18, image 14). Review of the MIP images confirms the above findings. NON-VASCULAR Lower Chest: No acute findings.  Coronary artery calcifications. Hepatobiliary: No solid liver abnormality is seen. Hepatic steatosis. No gallstones, gallbladder wall thickening, or biliary dilatation. Pancreas: Expansile appearance of the pancreatic head with adjacent fat stranding, in keeping with acute on chronic pancreatitis. No pancreatic ductal dilatation. Spleen: Normal in size without significant abnormality. Adrenals/Urinary Tract: Adrenal glands are unremarkable. Kidneys are normal, without renal calculi, solid lesion, or hydronephrosis. Bladder is unremarkable. Stomach/Bowel: Stomach is within normal limits. Appendix appears normal. No evidence of bowel wall thickening, distention, or inflammatory changes. Lymphatic: No enlarged abdominal or pelvic lymph nodes. Reproductive: No mass or other significant abnormality. Other: No abdominal wall hernia or abnormality. No ascites. Musculoskeletal: No acute osseous findings. IMPRESSION: 1. Angiographic examination technically limited as detailed above. Within these limitations, there is an enhancing ovoid lesion centered in the posterior pancreatic head, underlying the portal confluence and measuring 1.6 x 1.4 cm. 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 2. Focal effacement of the most central portion of the portal vein at its confluence. Early cavernous transformation. Small varices throughout the left upper quadrant. 3. Expansile appearance of the pancreatic head with adjacent fat stranding, in keeping with acute on chronic pancreatitis. 4. Hepatic steatosis. 5. Coronary artery disease advanced for patient age. Electronically Signed   By: Marolyn JONETTA Jaksch M.D.   On: 05/08/2024 16:23   CT ABDOMEN PELVIS W CONTRAST Result Date: 05/08/2024 CLINICAL DATA:  Abdominal pain.  Pancreatitis. EXAM: CT ABDOMEN AND PELVIS WITH CONTRAST TECHNIQUE: Multidetector CT imaging of the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

## 2024-05-26 NOTE — TOC Initial Note (Signed)
 Transition of Care [facility]) - Initial/Assessment Note    Patient Details  Name: Gary [name] MRN: [id number] Date of Birth: Jan 03, 1986  Transition of Care [facility] CM/SW Contact:    Corean ONEIDA Haddock, RN Phone Number: 05/26/2024, 11:10 AM  Clinical Narrative:                    Met with patient at bedside yesterday He states that he will be staying at his brothers in [place] at discharge.  TOC inquired how will be getting to his brother at discharge.  Patient states I'm walking out on my own Patient states that he doesn't have a PCP, he is aware that he will need to follow up with his insurance to arrange a provider in network.  Patient declines all substance abuse resources.  Patient declines any other TOC needs for discharge      Patient Goals and CMS Choice            Expected Discharge Plan and Services                                              Prior Living Arrangements/Services                       Activities of Daily Living   ADL Screening (condition at time of admission) Independently performs ADLs?: Yes (appropriate for developmental age) Is the patient deaf or have difficulty hearing?: No Does the patient have difficulty seeing, even when wearing glasses/contacts?: No Does the patient have difficulty concentrating, remembering, or making decisions?: No  Permission Sought/Granted                  Emotional Assessment              Admission diagnosis:  Alcohol  withdrawal (HCC) [F10.939] Patient Active Problem List   Diagnosis Date Noted   Depression with anxiety 05/21/2024   GERD (gastroesophageal reflux disease) 05/21/2024   Hypercalcemia 05/21/2024   Dehydration 05/19/2024   Heat exposure 05/19/2024   Epigastric pain 05/09/2024   SIRS (systemic inflammatory response syndrome) (HCC) 05/09/2024   Bipolar 2 disorder (HCC) 05/09/2024   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   Brief psychotic disorder (HCC)  04/20/2024   Hypotension 03/31/2024   High risk for readmission 03/01/2024   Alcohol  withdrawal (HCC) 02/25/2024   Severe protein-calorie malnutrition (HCC) 02/25/2024   Alcohol  induced acute pancreatitis 02/14/2024   Pancreatic lesion 02/13/2024   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/2024   Ileus (HCC) 12/25/2023   PTSD (post-traumatic stress disorder) 12/25/2023   Anxiety state 12/20/2023   Pancreatitis, necrotizing 12/17/2023   Tobacco use disorder 12/17/2023   Acute alcoholic pancreatitis 12/10/2023   Acute on chronic anemia 12/05/2023   Seizure (HCC) 11/19/2023   Gastritis and duodenitis 11/19/2023   Chronic pancreatitis (HCC) 10/16/2023   Acute on chronic pancreatitis (HCC) 10/16/2023   Alcoholic fatty liver 09/25/2023   Hypomagnesemia 12/24/2022   Seizure disorder (HCC) 12/17/2022   Pancytopenia (HCC) 12/14/2022   Alcohol -induced pancreatitis 12/14/2022   Delirium tremens (HCC) 10/19/2022   Alcohol  dependence with withdrawal (HCC) 10/16/2022   Homelessness 05/28/2022   Hypokalemia 01/22/2022   Constipation 07/30/2021   Malingering 05/15/2020   Alcoholic intoxication without complication (HCC) 07/02/2019   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 [place] [address] Phone: [phone] Fax: [phone]  [facility]) [phone] - [place], [place] - [address] AT [facility] [address] [place] [address] Phone: [phone] Fax: [phone]  [id number] [place], [place] - [address] AT [facility] OF [address] [place] [address] Phone: [phone] Fax: [phone]  [facility] [address] [place] [address] Phone: [phone] Fax: [phone]  [facility] #[id number] [place], [place] - [address] AT [facility] OF [address] [place] [address] Phone: [phone] Fax: [phone]  [facility] #[id number] [facility] [place] - [address] AT [facility] OF [address] [facility] [place] [address] Phone: [phone] Fax: [phone]     Social Drivers of Health (SDOH) Social History: SDOH Screenings   Food Insecurity: Patient Declined (04/18/2024)  Recent Concern: Food Insecurity - Food Insecurity Present (02/19/2024)  Housing: Patient Declined (04/18/2024)  Recent Concern: Housing - High Risk (02/19/2024)  Transportation Needs: Patient Declined (04/18/2024)  Recent Concern: Transportation Needs - Unmet Transportation Needs (02/19/2024)  Utilities: Patient Declined (04/18/2024)  Alcohol  Screen: High Risk (04/17/2024)  Depression (PHQ2-9): Medium Risk (01/28/2024)  Financial Resource Strain: Patient Declined (10/11/2023)  Social Connections: Patient Declined (04/18/2024)  Recent Concern: Social Connections - Socially Isolated (02/03/2024)  Stress: No Stress Concern Present (06/30/2022)   Received from [facility]  Recent Concern: Stress - Stress Concern Present (06/25/2022)   Received from [facility]  Tobacco Use: High Risk (05/21/2024)   SDOH Interventions:     Readmission Risk Interventions    02/14/2024    3:10 PM 12/15/2023   11:23 AM 11/29/2023   10:27 AM  Readmission Risk Prevention Plan  Transportation Screening Complete Complete Complete  Medication Review (RN Care Manager) Complete Complete Complete  PCP or Specialist appointment within 3-5 days of discharge Patient refused Complete   HRI or Home Care Consult Complete  Complete Complete  SW Recovery Care/Counseling Consult Complete Complete Complete  Palliative Care Screening Not Applicable Not Applicable Not Applicable  Skilled Nursing Facility Not Applicable Not Applicable Not Applicable

## 2024-06-11 ENCOUNTER — Encounter (HOSPITAL_BASED_OUTPATIENT_CLINIC_OR_DEPARTMENT_OTHER): Payer: Self-pay | Admitting: Emergency Medicine

## 2024-06-11 ENCOUNTER — Other Ambulatory Visit: Payer: Self-pay

## 2024-06-11 ENCOUNTER — Emergency Department (HOSPITAL_BASED_OUTPATIENT_CLINIC_OR_DEPARTMENT_OTHER)
Admission: EM | Admit: 2024-06-11 | Discharge: 2024-06-12 | Disposition: A | Discharge status: Home / Self Care | Payer: MEDICAID | Attending: Emergency Medicine | Admitting: Emergency Medicine

## 2024-06-11 DIAGNOSIS — F10920 Alcohol use, unspecified with intoxication, uncomplicated: Secondary | ICD-10-CM | POA: Insufficient documentation

## 2024-06-11 DIAGNOSIS — D649 Anemia, unspecified: Secondary | ICD-10-CM | POA: Diagnosis not present

## 2024-06-11 DIAGNOSIS — R748 Abnormal levels of other serum enzymes: Secondary | ICD-10-CM | POA: Diagnosis not present

## 2024-06-11 DIAGNOSIS — R739 Hyperglycemia, unspecified: Secondary | ICD-10-CM | POA: Insufficient documentation

## 2024-06-11 DIAGNOSIS — R569 Unspecified convulsions: Secondary | ICD-10-CM | POA: Diagnosis present

## 2024-06-11 DIAGNOSIS — Y908 Blood alcohol level of 240 mg/100 ml or more: Secondary | ICD-10-CM | POA: Insufficient documentation

## 2024-06-11 DIAGNOSIS — B20 Human immunodeficiency virus [HIV] disease: Secondary | ICD-10-CM | POA: Insufficient documentation

## 2024-06-11 LAB — CBC
HCT: 33.7 % — ABNORMAL LOW (ref 39.0–52.0)
Hemoglobin: 10.9 g/dL — ABNORMAL LOW (ref 13.0–17.0)
MCH: 28.4 pg (ref 26.0–34.0)
MCHC: 32.3 g/dL (ref 30.0–36.0)
MCV: 87.8 fL (ref 80.0–100.0)
Platelets: 272 K/uL (ref 150–400)
RBC: 3.84 MIL/uL — ABNORMAL LOW (ref 4.22–5.81)
RDW: 17.5 % — ABNORMAL HIGH (ref 11.5–15.5)
WBC: 4.6 K/uL (ref 4.0–10.5)
nRBC: 0 % (ref 0.0–0.2)

## 2024-06-11 LAB — MAGNESIUM: Magnesium: 2 mg/dL (ref 1.7–2.4)

## 2024-06-11 LAB — COMPREHENSIVE METABOLIC PANEL WITH GFR
ALT: 9 U/L (ref 0–44)
AST: 21 U/L (ref 15–41)
Albumin: 4 g/dL (ref 3.5–5.0)
Alkaline Phosphatase: 60 U/L (ref 38–126)
Anion gap: 15 (ref 5–15)
BUN: 8 mg/dL (ref 6–20)
CO2: 25 mmol/L (ref 22–32)
Calcium: 8.8 mg/dL — ABNORMAL LOW (ref 8.9–10.3)
Chloride: 106 mmol/L (ref 98–111)
Creatinine, Ser: 0.69 mg/dL (ref 0.61–1.24)
GFR, Estimated: >60 mL/min (ref >60)
Glucose, Bld: 108 mg/dL — ABNORMAL HIGH (ref 70–99)
Potassium: 3.7 mmol/L (ref 3.5–5.1)
Sodium: 145 mmol/L (ref 135–145)
Total Bilirubin: <0.2 mg/dL (ref 0.0–1.2)
Total Protein: 6.8 g/dL (ref 6.5–8.1)

## 2024-06-11 LAB — CBG MONITORING, ED: Glucose-Capillary: 118 mg/dL — ABNORMAL HIGH (ref 70–99)

## 2024-06-11 LAB — LIPASE, BLOOD: Lipase: 167 U/L — ABNORMAL HIGH (ref 11–51)

## 2024-06-11 MED ORDER — LEVETIRACETAM (KEPPRA) 500 MG/5 ML ADULT IV PUSH
1000.0000 mg | Freq: Once | INTRAVENOUS | Status: AC
  Administered 2024-06-11: 1000 mg via INTRAVENOUS
  Filled 2024-06-11: qty 10

## 2024-06-11 NOTE — ED Provider Notes (Signed)
 Gary [name]   CSN: [id number] Arrival date & time: 06/11/24  2100     Patient presents with: Seizures   Gary [name] is a 38 y.o. male.  {Add pertinent medical, surgical, social history, OB history to YEP:67052} The history is provided by the patient.  Seizures  He has history of bipolar disorder, alcohol  abuse, HIV disease, seizures, subdural hematoma and is brought in by EMS following a witnessed seizure.  EMS reports that his friends saw a tonic-clonic seizure.  He is reported to have been prescribed levetiracetam  but is not taking it because of inability to afford it.    Prior to Admission medications   Medication Sig Start Date End Date Taking? Authorizing Provider  bictegravir-emtricitabine -tenofovir  AF (BIKTARVY ) 50-200-25 MG TABS tablet Take 1 tablet by mouth daily. 02/24/24   Von Bellis, MD  feeding supplement (ENSURE PLUS HIGH PROTEIN) LIQD Take 237 mLs by mouth 2 (two) times daily between meals. 05/26/24   Caleen Qualia, MD  folic acid  (FOLVITE ) 1 MG tablet Take 1 tablet (1 mg total) by mouth daily. 05/27/24   Amin, Sumayya, MD  gabapentin  (NEURONTIN ) 100 MG capsule Take 2 capsules (200 mg total) by mouth 3 (three) times daily. 04/25/24   Blair Robin H, NP  lipase/protease/amylase (CREON ) 36000 UNITS CPEP capsule Take 1 capsule (36,000 Units total) by mouth 3 (three) times daily before meals. 05/26/24   Amin, Sumayya, MD  Multiple Vitamin (MULTIVITAMIN WITH MINERALS) TABS tablet Take 1 tablet by mouth daily. 05/26/24   Amin, Sumayya, MD  nicotine  (NICODERM CQ  - DOSED IN MG/24 HOURS) 14 mg/24hr patch Place 1 patch (14 mg total) onto the skin daily. 04/26/24   Bennett, Christal H, NP  pantoprazole  (PROTONIX ) 40 MG tablet Take 1 tablet (40 mg total) by mouth 2 (two) times daily. Patient taking differently: Take 40 mg by mouth 2 (two) times daily as needed (Acid reflux). 03/05/24   Dorinda Drue DASEN, MD  PHENobarbital   (LUMINAL) 32.4 MG tablet Take 1 tablet (32.4 mg total) by mouth 3 (three) times daily for 6 doses. 05/26/24 05/28/24  Amin, Sumayya, MD  QUEtiapine  (SEROQUEL ) 50 MG tablet Take 1 tablet (50 mg total) by mouth at bedtime. Patient not taking: Reported on 05/19/2024 04/25/24   Blair Robin H, NP  thiamine  (VITAMIN B-1) 100 MG tablet Take 1 tablet (100 mg total) by mouth daily. 05/26/24   Amin, Sumayya, MD  famotidine  (PEPCID ) 20 MG tablet Take 1 tablet (20 mg total) by mouth 2 (two) times daily. Patient not taking: Reported on 06/01/2019 05/14/19 06/02/19  [name], MD    Allergies: Risperidone  and paliperidone, Tegretol  [carbamazepine ], and Caffeine    Review of Systems  Neurological:  Positive for seizures.  All other systems reviewed and are negative.   Updated Vital Signs BP 97/64   Pulse 65   Temp 97.7 F (36.5 C) (Oral)   Resp 14   SpO2 96%   Physical Exam Vitals and nursing [name].   38 year old male, resting comfortably and in no acute distress. Vital signs are normal. Oxygen saturation is 96%, which is normal. Head is normocephalic and atraumatic. PERRLA. Neck is nontender and suppl. Lungs are clear without rales, wheezes, or rhonchi. Chest is nontender. Heart has regular rate and rhythm without murmur. Abdomen is soft, flat, nontender. Extremities have no cyanosis or edema. Skin is warm and dry without rash. Neurologic: Somnolent, responds to painful stimuli, moves all extremities equally.  (all labs  ordered are listed, but only abnormal results are displayed) Labs Reviewed  LIPASE, BLOOD - Abnormal; Notable for the following components:      Result Value   Lipase 167 (*)    All other components within normal limits  COMPREHENSIVE METABOLIC PANEL WITH GFR - Abnormal; Notable for the following components:   Glucose, Bld 108 (*)    Calcium  8.8 (*)    All other components within normal limits  CBC - Abnormal; Notable for the following components:   RBC  3.84 (*)    Hemoglobin 10.9 (*)    HCT 33.7 (*)    RDW 17.5 (*)    All other components within normal limits  CBG MONITORING, ED - Abnormal; Notable for the following components:   Glucose-Capillary 118 (*)    All other components within normal limits  URINALYSIS, ROUTINE W REFLEX MICROSCOPIC    EKG: EKG Interpretation Date/Time:  Sunday June 11 2024 21:15:36 EDT Ventricular Rate:  71 PR Interval:  159 QRS Duration:  91 QT Interval:  427 QTC Calculation: 464 R Axis:   81  Text Interpretation: Sinus rhythm no acute ischemic appearance. Confirmed by Armenta Canning [phone]) on 06/11/2024 10:46:57 PM  Radiology: No results found.  {Document cardiac monitor, telemetry assessment procedure when appropriate:32947} Procedures  Cardiac monitor shows normal sinus rhythm, per my interpretation. Medications Ordered in the ED - No data to display    {Click here for ABCD2, HEART and other calculators REFRESH [name] before signing:1}                              Medical Decision Making Amount and/or Complexity of Data Reviewed Labs: ordered.   Seizure and patient with known seizure disorder and noncompliant with anticonvulsants.  Also, history of alcohol  abuse, consider alcohol  withdrawal seizure.  I have reviewed his laboratory tests, and my interpretation is elevated random glucose level actually to be followed as an outpatient, elevated lipase concerning for pancreatitis-will need to assess for abdominal pain when she becomes more alert, stable anemia.  I have reviewed his past records, and he had admission 05/18/2024-05/21/2024 for alcoholic pancreatitis, ED visit on 04/30/2024 for seizure secondary to medication noncompliance.  {Document critical care time when appropriate  Document review of labs and clinical decision tools ie CHADS2VASC2, etc  Document your independent review of radiology images and any outside records  Document your discussion with family members, caretakers and with  consultants  Document social determinants of health affecting pt's care  Document your decision making why or why not admission, treatments were needed:32947:::1}   Final diagnoses:  None    ED Discharge Orders     None

## 2024-06-11 NOTE — ED Triage Notes (Signed)
 Seizure Witness by friend around 8:30 Tonic clonic Hx seizures, unable to afford keppra   NSS  VS 96/62 75 97%RA  Cbg 125  IV- 20g L FA

## 2024-06-12 ENCOUNTER — Other Ambulatory Visit (HOSPITAL_COMMUNITY): Payer: Self-pay

## 2024-06-12 LAB — URINALYSIS, ROUTINE W REFLEX MICROSCOPIC
Bilirubin Urine: NEGATIVE
Glucose, UA: NEGATIVE mg/dL
Hgb urine dipstick: NEGATIVE
Ketones, ur: NEGATIVE mg/dL
Leukocytes,Ua: NEGATIVE
Nitrite: NEGATIVE
Protein, ur: NEGATIVE mg/dL
Specific Gravity, Urine: 1.009 (ref 1.005–1.030)
pH: 5.5 (ref 5.0–8.0)

## 2024-06-12 LAB — PHENOBARBITAL LEVEL: Phenobarbital: <5 ug/mL — ABNORMAL LOW (ref 15.0–40.0)

## 2024-06-12 LAB — ETHANOL: Alcohol, Ethyl (B): 378 mg/dL (ref <15)

## 2024-06-12 MED ORDER — LEVETIRACETAM 500 MG PO TABS
500.0000 mg | ORAL_TABLET | Freq: Two times a day (BID) | ORAL | 3 refills | Status: DC
  Filled 2024-06-12: qty 60, 30d supply, fill #0

## 2024-06-12 NOTE — ED Notes (Signed)
 Dr. Raford aware of 27 Etoh level.

## 2024-06-12 NOTE — Discharge Instructions (Addendum)
 It is very important that you take your medication to prevent seizures.  If you do not take the medication, you will have additional seizures.

## 2024-06-16 ENCOUNTER — Other Ambulatory Visit: Payer: Self-pay

## 2024-06-16 ENCOUNTER — Encounter (HOSPITAL_COMMUNITY): Payer: Self-pay

## 2024-06-16 ENCOUNTER — Ambulatory Visit (HOSPITAL_COMMUNITY): Payer: Self-pay

## 2024-06-16 ENCOUNTER — Emergency Department (HOSPITAL_COMMUNITY): Payer: MEDICAID

## 2024-06-16 ENCOUNTER — Emergency Department (HOSPITAL_COMMUNITY)
Admission: EM | Admit: 2024-06-16 | Discharge: 2024-06-17 | Disposition: A | Discharge status: Home / Self Care | Payer: MEDICAID

## 2024-06-16 DIAGNOSIS — K860 Alcohol-induced chronic pancreatitis: Secondary | ICD-10-CM | POA: Diagnosis not present

## 2024-06-16 DIAGNOSIS — Y906 Blood alcohol level of 120-199 mg/100 ml: Secondary | ICD-10-CM | POA: Insufficient documentation

## 2024-06-16 DIAGNOSIS — R1013 Epigastric pain: Secondary | ICD-10-CM | POA: Diagnosis present

## 2024-06-16 DIAGNOSIS — D649 Anemia, unspecified: Secondary | ICD-10-CM

## 2024-06-16 DIAGNOSIS — Z21 Asymptomatic human immunodeficiency virus [HIV] infection status: Secondary | ICD-10-CM | POA: Diagnosis not present

## 2024-06-16 LAB — CBC
HCT: 35.3 % — ABNORMAL LOW (ref 39.0–52.0)
Hemoglobin: 11.6 g/dL — ABNORMAL LOW (ref 13.0–17.0)
MCH: 28.6 pg (ref 26.0–34.0)
MCHC: 32.9 g/dL (ref 30.0–36.0)
MCV: 87.2 fL (ref 80.0–100.0)
Platelets: 209 K/uL (ref 150–400)
RBC: 4.05 MIL/uL — ABNORMAL LOW (ref 4.22–5.81)
RDW: 17.6 % — ABNORMAL HIGH (ref 11.5–15.5)
WBC: 5.2 K/uL (ref 4.0–10.5)
nRBC: 0 % (ref 0.0–0.2)

## 2024-06-16 LAB — COMPREHENSIVE METABOLIC PANEL WITH GFR
ALT: 13 U/L (ref 0–44)
AST: 24 U/L (ref 15–41)
Albumin: 4.3 g/dL (ref 3.5–5.0)
Alkaline Phosphatase: 57 U/L (ref 38–126)
Anion gap: 12 (ref 5–15)
BUN: 8 mg/dL (ref 6–20)
CO2: 24 mmol/L (ref 22–32)
Calcium: 8.8 mg/dL — ABNORMAL LOW (ref 8.9–10.3)
Chloride: 103 mmol/L (ref 98–111)
Creatinine, Ser: 0.6 mg/dL — ABNORMAL LOW (ref 0.61–1.24)
GFR, Estimated: >60 mL/min (ref >60)
Glucose, Bld: 83 mg/dL (ref 70–99)
Potassium: 3.7 mmol/L (ref 3.5–5.1)
Sodium: 139 mmol/L (ref 135–145)
Total Bilirubin: 0.4 mg/dL (ref 0.0–1.2)
Total Protein: 7.9 g/dL (ref 6.5–8.1)

## 2024-06-16 LAB — LIPASE, BLOOD: Lipase: 51 U/L (ref 11–51)

## 2024-06-16 MED ORDER — LORAZEPAM 2 MG/ML IJ SOLN
0.0000 mg | Freq: Four times a day (QID) | INTRAMUSCULAR | Status: DC
  Administered 2024-06-16: 4 mg via INTRAVENOUS
  Administered 2024-06-17: 3 mg via INTRAVENOUS
  Filled 2024-06-16 (×2): qty 2

## 2024-06-16 MED ORDER — IOHEXOL 300 MG/ML  SOLN
100.0000 mL | Freq: Once | INTRAMUSCULAR | Status: AC | PRN
  Administered 2024-06-16: 100 mL via INTRAVENOUS

## 2024-06-16 MED ORDER — THIAMINE MONONITRATE 100 MG PO TABS: 100.0000 mg | ORAL_TABLET | Freq: Every day | ORAL | Status: DC

## 2024-06-16 MED ORDER — THIAMINE HCL 100 MG/ML IJ SOLN: 100.0000 mg | Freq: Every day | INTRAMUSCULAR | Status: DC

## 2024-06-16 MED ORDER — ONDANSETRON HCL 4 MG PO TABS: 4.0000 mg | ORAL_TABLET | Freq: Three times a day (TID) | ORAL | Status: DC | PRN

## 2024-06-16 MED ORDER — LORAZEPAM 1 MG PO TABS: 0.0000 mg | ORAL_TABLET | Freq: Two times a day (BID) | ORAL | Status: DC

## 2024-06-16 MED ORDER — HYDROMORPHONE HCL 1 MG/ML IJ SOLN
1.0000 mg | Freq: Once | INTRAMUSCULAR | Status: AC
  Administered 2024-06-16: 1 mg via INTRAVENOUS
  Filled 2024-06-16: qty 1

## 2024-06-16 MED ORDER — LORAZEPAM 1 MG PO TABS: 0.0000 mg | ORAL_TABLET | Freq: Four times a day (QID) | ORAL | Status: DC

## 2024-06-16 MED ORDER — LORAZEPAM 2 MG/ML IJ SOLN: 0.0000 mg | Freq: Two times a day (BID) | INTRAMUSCULAR | Status: DC

## 2024-06-16 MED ORDER — LEVETIRACETAM (KEPPRA) 500 MG/5 ML ADULT IV PUSH
500.0000 mg | Freq: Once | INTRAVENOUS | Status: AC
  Administered 2024-06-16: 500 mg via INTRAVENOUS
  Filled 2024-06-16: qty 5

## 2024-06-16 NOTE — ED Provider Notes (Signed)
 [place] EMERGENCY DEPARTMENT AT [facility] Provider Note   CSN: [id number] Arrival date & time: 06/16/24  2017     Patient presents with: Abdominal Pain   Gary [name] is a 38 y.o. male.   Is a 38 year old male presenting emergency department for epigastric abdominal pain x 2 days.  Symptoms started after drinking alcohol , tried to drink today but was unable to due to pain and vomiting.  Reports history of chronic pancreatitis with similar pain his last flare.  Reports he does somewhat feel like he is withdrawing from alcohol  as well   Abdominal Pain      Prior to Admission medications   Medication Sig Start Date End Date Taking? Authorizing Provider  bictegravir-emtricitabine -tenofovir  AF (BIKTARVY ) 50-200-25 MG TABS tablet Take 1 tablet by mouth daily. 02/24/24   Von Bellis, MD  feeding supplement (ENSURE PLUS HIGH PROTEIN) LIQD Take 237 mLs by mouth 2 (two) times daily between meals. 05/26/24   Amin, Sumayya, MD  folic acid  (FOLVITE ) 1 MG tablet Take 1 tablet (1 mg total) by mouth daily. 05/27/24   Amin, Sumayya, MD  gabapentin  (NEURONTIN ) 100 MG capsule Take 2 capsules (200 mg total) by mouth 3 (three) times daily. 04/25/24   Bennett, Christal H, NP  levETIRAcetam  (KEPPRA ) 500 MG tablet Take 1 tablet (500 mg total) by mouth 2 (two) times daily. 06/12/24   Raford Lenis, MD  lipase/protease/amylase (CREON ) 36000 UNITS CPEP capsule Take 1 capsule (36,000 Units total) by mouth 3 (three) times daily before meals. 05/26/24   Amin, Sumayya, MD  Multiple Vitamin (MULTIVITAMIN WITH MINERALS) TABS tablet Take 1 tablet by mouth daily. 05/26/24   Amin, Sumayya, MD  nicotine  (NICODERM CQ  - DOSED IN MG/24 HOURS) 14 mg/24hr patch Place 1 patch (14 mg total) onto the skin daily. 04/26/24   Bennett, Christal H, NP  pantoprazole  (PROTONIX ) 40 MG tablet Take 1 tablet (40 mg total) by mouth 2 (two) times daily. Patient taking differently: Take 40 mg by mouth 2 (two) times daily as needed  (Acid reflux). 03/05/24   Dorinda Drue DASEN, MD  PHENobarbital  (LUMINAL) 32.4 MG tablet Take 1 tablet (32.4 mg total) by mouth 3 (three) times daily for 6 doses. 05/26/24 05/28/24  Amin, Sumayya, MD  QUEtiapine  (SEROQUEL ) 50 MG tablet Take 1 tablet (50 mg total) by mouth at bedtime. Patient not taking: Reported on 05/19/2024 04/25/24   Blair Robin H, NP  thiamine  (VITAMIN B-1) 100 MG tablet Take 1 tablet (100 mg total) by mouth daily. 05/26/24   Amin, Sumayya, MD  famotidine  (PEPCID ) 20 MG tablet Take 1 tablet (20 mg total) by mouth 2 (two) times daily. Patient not taking: Reported on 06/01/2019 05/14/19 06/02/19  [name], MD    Allergies: Risperidone  and paliperidone, Tegretol  [carbamazepine ], and Caffeine    Review of Systems  Gastrointestinal:  Positive for abdominal pain.    Updated Vital Signs BP 122/74   Pulse (!) 108   Temp 98 F (36.7 C) (Oral)   Resp 14   SpO2 95%   Physical Exam Vitals and nursing note reviewed.  Constitutional:      General: He is not in acute distress.    Appearance: He is not toxic-appearing.  HENT:     Head: Normocephalic and atraumatic.  Cardiovascular:     Rate and Rhythm: Normal rate and regular rhythm.  Pulmonary:     Effort: Pulmonary effort is normal.     Breath sounds: Normal breath sounds.  Abdominal:  General: Abdomen is flat.     Palpations: Abdomen is soft.     Tenderness: There is abdominal tenderness in the epigastric area.  Skin:    General: Skin is warm and dry.     Capillary Refill: Capillary refill takes less than 2 seconds.  Neurological:     General: No focal deficit present.     Mental Status: He is alert.  Psychiatric:        Mood and Affect: Mood normal.        Behavior: Behavior normal.     (all labs ordered are listed, but only abnormal results are displayed) Labs Reviewed  COMPREHENSIVE METABOLIC PANEL WITH GFR - Abnormal; Notable for the following components:      Result Value   Creatinine, Ser 0.60  (*)    Calcium  8.8 (*)    All other components within normal limits  CBC - Abnormal; Notable for the following components:   RBC 4.05 (*)    Hemoglobin 11.6 (*)    HCT 35.3 (*)    RDW 17.6 (*)    All other components within normal limits  LIPASE, BLOOD  URINALYSIS, ROUTINE W REFLEX MICROSCOPIC  ETHANOL    EKG: None  Radiology: No results found.   Procedures   Medications Ordered in the ED  LORazepam  (ATIVAN ) injection 0-4 mg (4 mg Intravenous Given 06/16/24 2240)    Or  LORazepam  (ATIVAN ) tablet 0-4 mg ( Oral See Alternative 06/16/24 2240)  LORazepam  (ATIVAN ) injection 0-4 mg (has no administration in time range)    Or  LORazepam  (ATIVAN ) tablet 0-4 mg (has no administration in time range)  thiamine  (VITAMIN B1) tablet 100 mg (has no administration in time range)    Or  thiamine  (VITAMIN B1) injection 100 mg (has no administration in time range)  ondansetron  (ZOFRAN ) tablet 4 mg (has no administration in time range)  levETIRAcetam  (KEPPRA ) undiluted injection 500 mg (has no administration in time range)  HYDROmorphone  (DILAUDID ) injection 1 mg (1 mg Intravenous Given 06/16/24 2234)  iohexol  (OMNIPAQUE ) 300 MG/ML solution 100 mL (100 mLs Intravenous Contrast Given 06/16/24 2304)    Clinical Course as of 06/16/24 2325  Fri Jun 16, 2024  2152 Admitted 7/13 per my chart review: Assessment and Plan: * Acute on chronic pancreatitis [facility]) Multiple hospitalization most recent was 2 days ago and he was discharged yesterday morning at his request otherwise he was threatening to leave AMA, apparently went out and started drinking and came back with worsening abdominal pain.  This time lipase was elevated to 184 and repeat CT abdomen and pelvis with increased inflammation and decreased vessel caliber. - CTA abdomen was negative for any vascular abnormality -Patient will remain high risk due to his behavior -Continue with supportive care - Creon  dose was increased  [TY]    Clinical  Course User Index [TY] Neysa Caron PARAS, DO                                 Medical Decision Making 38 year old male presenting emergency department for epigastric abdominal pain.  Does have a complicated past medical history that includes chronic pancreatitis polysubstance abuse, alcohol  abuse, HIV, schizophrenia, bipolar who presented to the emergency department.  Admitted last month for acute on chronic pancreatitis per chart review.  Has had several CT scans with possible fluid collections around pancreas.  Does note that he has not had much in the way of alcohol   today secondary to his pain and nausea vomiting.  Reports some minor withdrawal symptoms.  CIWA ordered.  Screening labs ordered as well largely reassuring no metabolic derangements.  No transaminitis.  Lipase of 51, but has had admissions with normal lipase before.  No leukocytosis.  Stable anemia.  Was given IV Dilaudid  for the pain.  Also with his reports of unable to tolerate p.o. will give him dose of Keppra  for seizure prophylaxis  Care signed out to overnight team dispo pending CT scan and reevaluation.  Amount and/or Complexity of Data Reviewed External Data Reviewed:     Details: See ED course Labs: ordered. Decision-making details documented in ED Course.    Details: Details as above Radiology: ordered.  Risk OTC drugs. Prescription drug management.      Final diagnoses:  None    ED Discharge Orders     None          Neysa Caron PARAS, DO 06/16/24 2325

## 2024-06-16 NOTE — ED Triage Notes (Signed)
 Pt. Arrives via ems for abdominal pain x2 days. Reports nausea and vomiting. Hx of pancreatitis. Admits to having 40oz of beer.

## 2024-06-16 NOTE — ED Provider Notes (Signed)
 Care assumed from Dr. Neysa, patient with abdominal pain, history of alcoholic pancreatitis, getting CT of abdomen, currently on CIWA protocol.  CT scan shows signs of pancreatitis and probable early development of pseudocyst.  I independently viewed the images, and agree with the radiologist's interpretation.  However, lipase is normal and he has recently had an elevated lipase so I do not think that the pancreatitis is that significant.  Patient is observed to be sleeping comfortably, but then when he wakes up he complains of pain and requests more pain medication.  I have reevaluated his abdomen and it is completely benign.  I have informed the patient that he needs to abstain from alcohol  in order to prevent further damage to his pancreas.  I am discharging him with prescriptions for chlordiazepoxide , oxycodone  and also I am giving him refills for his levetiracetam  and phenobarbital .  I am referring him to gastroenterology for further outpatient observation and management regarding his pseudocyst.  Results for orders placed or performed during the hospital encounter of 06/16/24  Lipase, blood   Collection Time: 06/16/24 10:02 PM  Result Value Ref Range   Lipase 51 11 - 51 U/L  Comprehensive metabolic panel   Collection Time: 06/16/24 10:02 PM  Result Value Ref Range   Sodium 139 135 - 145 mmol/L   Potassium 3.7 3.5 - 5.1 mmol/L   Chloride 103 98 - 111 mmol/L   CO2 24 22 - 32 mmol/L   Glucose, Bld 83 70 - 99 mg/dL   BUN 8 6 - 20 mg/dL   Creatinine, Ser 9.39 (L) 0.61 - 1.24 mg/dL   Calcium  8.8 (L) 8.9 - 10.3 mg/dL   Total Protein 7.9 6.5 - 8.1 g/dL   Albumin 4.3 3.5 - 5.0 g/dL   AST 24 15 - 41 U/L   ALT 13 0 - 44 U/L   Alkaline Phosphatase 57 38 - 126 U/L   Total Bilirubin 0.4 0.0 - 1.2 mg/dL   GFR, Estimated >39 >39 mL/min   Anion gap 12 5 - 15  CBC   Collection Time: 06/16/24 10:02 PM  Result Value Ref Range   WBC 5.2 4.0 - 10.5 K/uL   RBC 4.05 (L) 4.22 - 5.81 MIL/uL   Hemoglobin  11.6 (L) 13.0 - 17.0 g/dL   HCT 64.6 (L) 60.9 - 47.9 %   MCV 87.2 80.0 - 100.0 fL   MCH 28.6 26.0 - 34.0 pg   MCHC 32.9 30.0 - 36.0 g/dL   RDW 82.3 (H) 88.4 - 84.4 %   Platelets 209 150 - 400 K/uL   nRBC 0.0 0.0 - 0.2 %  Ethanol   Collection Time: 06/17/24  2:18 AM  Result Value Ref Range   Alcohol , Ethyl (B) 192 (H) <15 mg/dL  Urinalysis, Routine w reflex microscopic -Urine, Clean Catch   Collection Time: 06/17/24  4:27 AM  Result Value Ref Range   Color, Urine YELLOW YELLOW   APPearance CLEAR CLEAR   Specific Gravity, Urine 1.040 (H) 1.005 - 1.030   pH 6.0 5.0 - 8.0   Glucose, UA NEGATIVE NEGATIVE mg/dL   Hgb urine dipstick NEGATIVE NEGATIVE   Bilirubin Urine NEGATIVE NEGATIVE   Ketones, ur NEGATIVE NEGATIVE mg/dL   Protein, ur NEGATIVE NEGATIVE mg/dL   Nitrite NEGATIVE NEGATIVE   Leukocytes,Ua NEGATIVE NEGATIVE   CT ABDOMEN PELVIS W CONTRAST Result Date: 06/16/2024 CLINICAL DATA:  Pancreatitis, acute, severe.  Abdominal pain EXAM: CT ABDOMEN AND PELVIS WITH CONTRAST TECHNIQUE: Multidetector CT imaging of the abdomen  and pelvis was performed using the standard protocol following bolus administration of intravenous contrast. RADIATION DOSE REDUCTION: This exam was performed according to the departmental dose-optimization program which includes automated exposure control, adjustment of the mA and/or kV according to patient size and/or use of iterative reconstruction technique. CONTRAST:  OMNIPAQUE  IOHEXOL  300 MG/ML  SOLN COMPARISON:  CT abdomen pelvis 05/22/2024, CT abdomen pelvis 05/08/2024, MR abdomen 03/01/2024 FINDINGS: Lower chest: No acute abnormality. Hepatobiliary: The hepatic parenchyma is diffusely hypodense compared to the splenic parenchyma consistent with fatty infiltration. No focal liver abnormality. No gallstones, gallbladder wall thickening, or pericholecystic fluid. No biliary dilatation. Pancreas: Poorly visualized pancreatic head and neck hypodensity measuring up  to 2.5 cm (8:72) with slightly irregular edematous proximal pancreatic parenchyma. No main pancreatic ductal dilatation. Spleen: Normal in size without focal abnormality. Adrenals/Urinary Tract: No adrenal nodule bilaterally. Bilateral kidneys enhance symmetrically. No hydronephrosis. No hydroureter. Mild smooth anterior urinary bladder wall thickening (10:85) likely due to under distension. Otherwise unremarkable urinary bladder. Stomach/Bowel: Stomach is within normal limits. No evidence of bowel wall thickening or dilatation. Colonic diverticulosis. Appendix appears normal. Vascular/Lymphatic: No abdominal aorta or iliac aneurysm. No abdominal, pelvic, or inguinal lymphadenopathy. Reproductive: Prostate is unremarkable. Other: No intraperitoneal free fluid. No intraperitoneal free gas. No organized fluid collection. Musculoskeletal: No abdominal wall hernia or abnormality. No suspicious lytic or blastic osseous lesions. No acute displaced fracture. IMPRESSION: 1. Poorly visualized pancreatic head and neck hypodensity measuring up to 2.5 cm with slightly irregular edematous proximal pancreatic parenchyma. Findings suggestive of acute pancreatitis with developing pseudocyst not excluded. 2. Mild anterior urinary bladder wall thickening likely due to under distension anteriorly. Consider correlation with urinalysis. Recommend attention on follow-up. 3. Colonic diverticulosis with no acute diverticulitis. 4. Prostatomegaly. Electronically Signed   By: Morgane  Naveau M.D.   On: 06/16/2024 23:32   CT Angio Abd/Pel w/ and/or w/o Result Date: 05/22/2024 CLINICAL DATA:  Aneurysm, renal or visceral Rule out any thrombosis with recurrent pancreatitis and concern of portal hypertension. EXAM: CTA ABDOMEN AND PELVIS WITHOUT AND WITH CONTRAST TECHNIQUE: Multidetector CT imaging of the abdomen and pelvis was performed using the standard protocol during bolus administration of intravenous contrast. Multiplanar reconstructed  images and MIPs were obtained and reviewed to evaluate the vascular anatomy. RADIATION DOSE REDUCTION: This exam was performed according to the departmental dose-optimization program which includes automated exposure control, adjustment of the mA and/or kV according to patient size and/or use of iterative reconstruction technique. CONTRAST:  80mL OMNIPAQUE  IOHEXOL  350 MG/ML SOLN COMPARISON:  CTA abdomen and pelvis from 05/08/2024. FINDINGS: VASCULAR Aorta: Normal caliber aorta without aneurysm, dissection, vasculitis or significant stenosis. Celiac: Patent without evidence of aneurysm, dissection, vasculitis or significant stenosis. SMA: Patent without evidence of aneurysm, dissection, vasculitis or significant stenosis. Renals: Both renal arteries are patent without evidence of aneurysm, dissection, vasculitis, fibromuscular dysplasia or significant stenosis. IMA: Patent without evidence of aneurysm, dissection, vasculitis or significant stenosis. Inflow: Patent without evidence of aneurysm, dissection, vasculitis or significant stenosis. Proximal Outflow: Bilateral common femoral and visualized portions of the superficial and profunda femoral arteries are patent without evidence of aneurysm, dissection, vasculitis or significant stenosis. Veins: No obvious venous abnormality within the limitations of this arterial phase study. An ovoid enhancing lesion seen on the prior CT angiography from 05/08/2024 is not seen on this arterial phase images. And therefore an arterial aneurysm is excluded. However, this examination is very limited for evaluation of venous varix. Please refer to prior CT scan abdomen and pelvis from  05/22/2024 and 05/19/2024, which were performed during the portal venous phase. Review of the MIP images confirms the above findings. NON-VASCULAR Lower chest: There are patchy atelectatic changes in the visualized lung bases. No overt consolidation. No pleural effusion. The heart is normal in size. No  pericardial effusion. Hepatobiliary: The liver is normal in size. Non-cirrhotic configuration. No suspicious mass. No intrahepatic or extrahepatic bile duct dilation. There dependent hyperattenuating areas in the gallbladder, likely vicarious excretion of previously administered intravenous contrast versus sludge. No imaging evidence of acute cholecystitis. Normal gallbladder wall thickness. No pericholecystic inflammatory changes. Pancreas: Redemonstration of fat stranding surrounding the pancreatic head/uncinate process, compatible with history of acute pancreatitis. No associated peripancreatic walled-off abscess or collection seen. No suspicious pancreatic lesion. Main pancreatic duct is not dilated. Spleen: Within normal limits. No focal lesion. Adrenals/Urinary Tract: Adrenal glands are unremarkable. No suspicious renal mass. No hydronephrosis. No renal or ureteric calculi. Unremarkable urinary bladder. Stomach/Bowel: No disproportionate dilation of the small or large bowel loops. No evidence of abnormal bowel wall thickening or inflammatory changes. The appendix is unremarkable. Vascular/Lymphatic: No ascites or pneumoperitoneum. No abdominal or pelvic lymphadenopathy, by size criteria. Reproductive: Normal size prostate. Symmetric seminal vesicles. Other: There is a tiny fat containing umbilical hernia. The soft tissues and abdominal wall are otherwise unremarkable. Musculoskeletal: No suspicious osseous lesions. IMPRESSION: VASCULAR No dissection, aneurysm, vasculitis or other acute vascular abnormality. *Previously seen ovoid enhancing lesion is not seen on this arterial phase images. Therefore, an arterial aneurysm is excluded. However, this examination is very limited for evaluation of venous varix. Please refer to prior CT scan abdomen and pelvis from 05/22/2024 and 05/19/2024, which were performed during the portal venous phase. NON-VASCULAR 1. Redemonstration of the fat stranding surrounding the  pancreatic head/uncinate process, compatible with history of acute pancreatitis. No associated peripancreatic walled-off abscess or collection seen. 2. Multiple other nonacute observations, as described above. Electronically Signed   By: Ree Molt M.D.   On: 05/22/2024 14:03   CT ABDOMEN PELVIS W CONTRAST Result Date: 05/22/2024 CLINICAL DATA:  Acute severe pancreatitis, alcohol  withdrawal EXAM: CT ABDOMEN AND PELVIS WITH CONTRAST TECHNIQUE: Multidetector CT imaging of the abdomen and pelvis was performed using the standard protocol following bolus administration of intravenous contrast. RADIATION DOSE REDUCTION: This exam was performed according to the departmental dose-optimization program which includes automated exposure control, adjustment of the mA and/or kV according to patient size and/or use of iterative reconstruction technique. CONTRAST:  OMNIPAQUE  IOHEXOL  300 MG/ML  SOLN COMPARISON:  05/19/2024 FINDINGS: Lower chest: No acute abnormality. Hepatobiliary: Moderate hepatic steatosis. No enhancing intrahepatic mass. No intra or extrahepatic biliary ductal dilation. Gallbladder unremarkable. Interval development of multiple periduodenal and peribiliary portal venous collaterals. Pancreas: There is interval enlargement of the acute peripancreatic inflammatory fluid collection seen adjacent to the head of the pancreas and coursing along the portal triad now measuring at least 3.0 x 5.3 cm (30/2). There is marked, progressive narrowing of the proximal portal vein best seen on coronal image 37/5, with the vessel now measuring 1 mm in diameter at its narrowest segment. There is normal enhancement of the pancreatic parenchyma; no evidence of pancreatic parenchymal necrosis. Mild peripancreatic inflammatory changes are again identified surrounding the head of the pancreas. Spleen: Normal in size without focal abnormality. Adrenals/Urinary Tract: Adrenal glands are unremarkable. Kidneys are normal,  without renal calculi, focal lesion, or hydronephrosis. Bladder is unremarkable. Stomach/Bowel: There is hyperemia involving the duodenum with mild periduodenal inflammatory stranding particularly involving the second third portion  the duodenum related to the adjacent inflammatory process involving the head of the pancreas. No evidence of obstruction or perforation. Stomach, small bowel, and large bowel are otherwise unremarkable. Appendix normal. Interval development of trace ascites. Vascular/Lymphatic: See above. 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 The abdominal vasculature is otherwise unremarkable. Reproductive: Moderate prostatic hypertrophy. Other: No abdominal wall hernia Musculoskeletal: No acute or significant osseous findings. IMPRESSION: 1. Interval enlargement of the acute peripancreatic inflammatory fluid collection adjacent to the head of the pancreas and coursing along the portal triad now measuring at least 3.0 x 5.3 cm. There is marked, progressive narrowing of the proximal portal vein, with the vessel now measuring 1 mm in diameter at its narrowest segment. There is also severe narrowing of the intrahepatic inferior vena cava, progressive since prior examination but of unclear etiology. Interval development of multiple periduodenal and peribiliary portal venous collaterals. 2. Moderate hepatic steatosis. 3. Interval development of trace ascites. Electronically Signed   By: Dorethia Molt M.D.   On: 05/22/2024 00:25   CT ABDOMEN PELVIS W CONTRAST Result Date: 05/19/2024 CLINICAL DATA:  Acute severe pancreatitis.  Alcohol  intoxication. EXAM: CT ABDOMEN AND PELVIS WITH CONTRAST TECHNIQUE: Multidetector CT imaging of the abdomen and pelvis was performed using the standard protocol following bolus administration of intravenous contrast. RADIATION DOSE REDUCTION: This exam was performed according to the  departmental dose-optimization program which includes automated exposure control, adjustment of the mA and/or kV according to patient size and/or use of iterative reconstruction technique. CONTRAST:  OMNIPAQUE  IOHEXOL  300 MG/ML  SOLN COMPARISON:  05/08/2024 FINDINGS: Lower chest: Mild dependent changes in the lung bases. Hepatobiliary: Mild diffuse fatty infiltration of the liver. No gallstones, gallbladder wall thickening, or biliary dilatation. Pancreas: Heterogeneous enlargement of the pancreatic head and uncinate process with stranding in the surrounding peripancreatic fat consistent with history of acute pancreatitis. No pancreatic ductal dilatation. Inflammatory changes are increased since the prior study suggesting progression. Heterogeneous low-attenuation changes in the pancreatic head consistent with focal pancreatic necrosis. No walled-off collections are identified. 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 Spleen: Normal in size without focal abnormality. Adrenals/Urinary Tract: Adrenal glands are unremarkable. Kidneys are normal, without renal calculi, focal lesion, or hydronephrosis. Bladder is unremarkable. Stomach/Bowel: Stomach, small bowel, and colon are not abnormally distended. No wall thickening or inflammatory stranding. Scattered stool throughout the colon. Appendix is normal. Vascular/Lymphatic: No significant vascular findings are present. No enlarged abdominal or pelvic lymph nodes. Reproductive: Prostate gland is enlarged. Other: No abdominal wall hernia or abnormality. No abdominopelvic ascites. Musculoskeletal: No acute or significant osseous findings. IMPRESSION: 1. Progressing changes of acute pancreatitis in the head and uncinate process of the pancreas. Increased pancreatic swelling with peripheral edema around the pancreatic head and developing areas of low-attenuation suggesting developing focal necrosis. No walled-off  collections. 2. 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 3. Fatty infiltration of the liver. 4. Enlarged prostate gland. Electronically Signed   By: Elsie Gravely M.D.   On: 05/19/2024 03:11      Raford Lenis, MD 06/17/24 [phone]

## 2024-06-17 ENCOUNTER — Other Ambulatory Visit (HOSPITAL_COMMUNITY): Payer: Self-pay

## 2024-06-17 LAB — URINALYSIS, ROUTINE W REFLEX MICROSCOPIC
Bilirubin Urine: NEGATIVE
Glucose, UA: NEGATIVE mg/dL
Hgb urine dipstick: NEGATIVE
Ketones, ur: NEGATIVE mg/dL
Leukocytes,Ua: NEGATIVE
Nitrite: NEGATIVE
Protein, ur: NEGATIVE mg/dL
Specific Gravity, Urine: 1.04 — ABNORMAL HIGH (ref 1.005–1.030)
pH: 6 (ref 5.0–8.0)

## 2024-06-17 LAB — ETHANOL: Alcohol, Ethyl (B): 192 mg/dL — ABNORMAL HIGH (ref <15)

## 2024-06-17 MED ORDER — OXYCODONE-ACETAMINOPHEN 5-325 MG PO TABS
1.0000 | ORAL_TABLET | Freq: Once | ORAL | Status: AC
  Administered 2024-06-17: 1 via ORAL
  Filled 2024-06-17: qty 1

## 2024-06-17 MED ORDER — PHENOBARBITAL 32.4 MG PO TABS
32.4000 mg | ORAL_TABLET | Freq: Three times a day (TID) | ORAL | 0 refills | Status: DC
  Filled 2024-06-17: qty 6, 2d supply, fill #0

## 2024-06-17 MED ORDER — LEVETIRACETAM 500 MG PO TABS
500.0000 mg | ORAL_TABLET | Freq: Two times a day (BID) | ORAL | 3 refills | Status: DC
Start: 2024-06-17 — End: 2024-06-28

## 2024-06-17 MED ORDER — OXYCODONE HCL 5 MG PO TABS
5.0000 mg | ORAL_TABLET | ORAL | 0 refills | Status: DC | PRN
  Filled 2024-06-17: qty 15, 3d supply, fill #0

## 2024-06-17 MED ORDER — CHLORDIAZEPOXIDE HCL 25 MG PO CAPS
ORAL_CAPSULE | ORAL | 0 refills | Status: AC
  Filled 2024-06-17: qty 10, 3d supply, fill #0

## 2024-06-17 NOTE — Discharge Instructions (Addendum)
 Your pancreas shows signs of developing something called a pseudocyst.  Please follow-up with the gastroenterologist to monitor this.  If you continue to drink, your pancreas will continue to get worse, and the pseudocyst will also get worse.

## 2024-06-20 ENCOUNTER — Other Ambulatory Visit (HOSPITAL_COMMUNITY): Payer: Self-pay

## 2024-06-22 ENCOUNTER — Other Ambulatory Visit (HOSPITAL_COMMUNITY): Payer: Self-pay

## 2024-06-22 ENCOUNTER — Inpatient Hospital Stay (HOSPITAL_BASED_OUTPATIENT_CLINIC_OR_DEPARTMENT_OTHER)
Admission: EM | Admit: 2024-06-22 | Discharge: 2024-06-28 | DRG: 896 | Disposition: A | Discharge status: Home / Self Care | Payer: MEDICAID | Attending: Internal Medicine | Admitting: Internal Medicine

## 2024-06-22 ENCOUNTER — Other Ambulatory Visit: Payer: Self-pay

## 2024-06-22 DIAGNOSIS — F3181 Bipolar II disorder: Secondary | ICD-10-CM | POA: Diagnosis present

## 2024-06-22 DIAGNOSIS — F10229 Alcohol dependence with intoxication, unspecified: Secondary | ICD-10-CM | POA: Diagnosis present

## 2024-06-22 DIAGNOSIS — D649 Anemia, unspecified: Secondary | ICD-10-CM | POA: Diagnosis present

## 2024-06-22 DIAGNOSIS — D6959 Other secondary thrombocytopenia: Secondary | ICD-10-CM | POA: Diagnosis present

## 2024-06-22 DIAGNOSIS — E872 Acidosis, unspecified: Secondary | ICD-10-CM | POA: Diagnosis present

## 2024-06-22 DIAGNOSIS — E8729 Other acidosis: Secondary | ICD-10-CM | POA: Diagnosis present

## 2024-06-22 DIAGNOSIS — Z716 Tobacco abuse counseling: Secondary | ICD-10-CM

## 2024-06-22 DIAGNOSIS — F17210 Nicotine dependence, cigarettes, uncomplicated: Secondary | ICD-10-CM | POA: Diagnosis present

## 2024-06-22 DIAGNOSIS — K852 Alcohol induced acute pancreatitis without necrosis or infection: Secondary | ICD-10-CM | POA: Diagnosis present

## 2024-06-22 DIAGNOSIS — B20 Human immunodeficiency virus [HIV] disease: Secondary | ICD-10-CM | POA: Diagnosis present

## 2024-06-22 DIAGNOSIS — Z21 Asymptomatic human immunodeficiency virus [HIV] infection status: Secondary | ICD-10-CM | POA: Diagnosis present

## 2024-06-22 DIAGNOSIS — Z811 Family history of alcohol abuse and dependence: Secondary | ICD-10-CM

## 2024-06-22 DIAGNOSIS — Z79899 Other long term (current) drug therapy: Secondary | ICD-10-CM

## 2024-06-22 DIAGNOSIS — D72819 Decreased white blood cell count, unspecified: Secondary | ICD-10-CM | POA: Diagnosis present

## 2024-06-22 DIAGNOSIS — K863 Pseudocyst of pancreas: Secondary | ICD-10-CM | POA: Diagnosis present

## 2024-06-22 DIAGNOSIS — F17200 Nicotine dependence, unspecified, uncomplicated: Secondary | ICD-10-CM | POA: Diagnosis present

## 2024-06-22 DIAGNOSIS — Z8249 Family history of ischemic heart disease and other diseases of the circulatory system: Secondary | ICD-10-CM

## 2024-06-22 DIAGNOSIS — F10239 Alcohol dependence with withdrawal, unspecified: Principal | ICD-10-CM | POA: Diagnosis present

## 2024-06-22 DIAGNOSIS — F418 Other specified anxiety disorders: Secondary | ICD-10-CM | POA: Diagnosis present

## 2024-06-22 DIAGNOSIS — K861 Other chronic pancreatitis: Secondary | ICD-10-CM | POA: Diagnosis present

## 2024-06-22 DIAGNOSIS — Z888 Allergy status to other drugs, medicaments and biological substances status: Secondary | ICD-10-CM

## 2024-06-22 DIAGNOSIS — K859 Acute pancreatitis without necrosis or infection, unspecified: Secondary | ICD-10-CM | POA: Diagnosis present

## 2024-06-22 DIAGNOSIS — K219 Gastro-esophageal reflux disease without esophagitis: Secondary | ICD-10-CM | POA: Diagnosis present

## 2024-06-22 DIAGNOSIS — K869 Disease of pancreas, unspecified: Secondary | ICD-10-CM | POA: Diagnosis present

## 2024-06-22 DIAGNOSIS — K860 Alcohol-induced chronic pancreatitis: Secondary | ICD-10-CM

## 2024-06-22 DIAGNOSIS — G40909 Epilepsy, unspecified, not intractable, without status epilepticus: Secondary | ICD-10-CM | POA: Diagnosis present

## 2024-06-22 DIAGNOSIS — F319 Bipolar disorder, unspecified: Secondary | ICD-10-CM | POA: Diagnosis present

## 2024-06-22 DIAGNOSIS — D61818 Other pancytopenia: Secondary | ICD-10-CM | POA: Diagnosis present

## 2024-06-22 DIAGNOSIS — F10930 Alcohol use, unspecified with withdrawal, uncomplicated: Principal | ICD-10-CM

## 2024-06-22 DIAGNOSIS — Y908 Blood alcohol level of 240 mg/100 ml or more: Secondary | ICD-10-CM | POA: Diagnosis present

## 2024-06-22 DIAGNOSIS — Z800 Family history of malignant neoplasm of digestive organs: Secondary | ICD-10-CM

## 2024-06-22 DIAGNOSIS — F419 Anxiety disorder, unspecified: Secondary | ICD-10-CM | POA: Diagnosis present

## 2024-06-22 MED ORDER — PHENOBARBITAL SODIUM 65 MG/ML IJ SOLN: 32.5000 mg | Freq: Three times a day (TID) | INTRAMUSCULAR | Status: DC

## 2024-06-22 MED ORDER — MIDAZOLAM HCL 2 MG/2ML IJ SOLN
1.0000 mg | INTRAMUSCULAR | Status: DC | PRN
  Administered 2024-06-23: 2 mg via INTRAVENOUS
  Administered 2024-06-23 (×2): 1 mg via INTRAVENOUS
  Administered 2024-06-23 – 2024-06-26 (×8): 2 mg via INTRAVENOUS
  Administered 2024-06-27 (×2): 1 mg via INTRAVENOUS
  Filled 2024-06-22 (×14): qty 2

## 2024-06-22 MED ORDER — ONDANSETRON HCL 4 MG/2ML IJ SOLN
4.0000 mg | Freq: Once | INTRAMUSCULAR | Status: AC
  Administered 2024-06-23: 4 mg via INTRAVENOUS
  Filled 2024-06-22: qty 2

## 2024-06-22 MED ORDER — PHENOBARBITAL SODIUM 65 MG/ML IJ SOLN
65.0000 mg | Freq: Once | INTRAMUSCULAR | Status: AC
  Administered 2024-06-23: 65 mg via INTRAVENOUS
  Filled 2024-06-22: qty 1

## 2024-06-22 MED ORDER — PHENOBARBITAL SODIUM 130 MG/ML IJ SOLN: 97.5000 mg | Freq: Three times a day (TID) | INTRAMUSCULAR | Status: DC

## 2024-06-22 MED ORDER — LACTATED RINGERS IV BOLUS
1000.0000 mL | Freq: Once | INTRAVENOUS | Status: AC
  Administered 2024-06-23: 1000 mL via INTRAVENOUS

## 2024-06-22 MED ORDER — PHENOBARBITAL SODIUM 65 MG/ML IJ SOLN: 65.0000 mg | Freq: Three times a day (TID) | INTRAMUSCULAR | Status: DC

## 2024-06-22 MED ORDER — PHENOBARBITAL SODIUM 130 MG/ML IJ SOLN: 130.0000 mg | Freq: Once | INTRAMUSCULAR | Status: DC

## 2024-06-22 MED ORDER — HYDROMORPHONE HCL 1 MG/ML IJ SOLN
1.0000 mg | Freq: Once | INTRAMUSCULAR | Status: AC
  Administered 2024-06-23: 1 mg via INTRAVENOUS
  Filled 2024-06-22: qty 1

## 2024-06-22 NOTE — ED Triage Notes (Signed)
 Pt BIB [facility] reporting n/v due to pancreatitis, hx alcohol  abuse, last drink was this morning. Tremors in triage.

## 2024-06-22 NOTE — ED Provider Notes (Signed)
 [place] EMERGENCY DEPARTMENT AT [facility] Provider Note   CSN: [id number] Arrival date & time: 06/22/24  2313     Patient presents with: Abdominal Pain   Gary [name] is a 38 y.o. male.   Patient with a history of alcohol  abuse, HIV, pancreatitis, seizure disorder, schizophrenia here with abdominal pain with nausea and vomiting.  Symptoms similar to previous episodes of pancreatitis.  States his last alcoholic drink was either yesterday or this morning.  Drinks beer and wine but denies any liquor use.  States he vomited too many times to count today that were nonbloody.  No diarrhea.  No fever.  No chest pain or shortness of breath.  No fever.  No pain with urination blood in the urine.  Pain similar to previous episodes of pancreatitis.  Was seen 6 days ago for same and left AMA.  He feels tremulous and shaky.  The history is provided by the patient.  Abdominal Pain Associated symptoms: nausea and vomiting   Associated symptoms: no chest pain, no cough, no dysuria, no fever, no hematuria and no shortness of breath        Prior to Admission medications   Medication Sig Start Date End Date Taking? Authorizing Provider  bictegravir-emtricitabine -tenofovir  AF (BIKTARVY ) 50-200-25 MG TABS tablet Take 1 tablet by mouth daily. 02/24/24   Von Bellis, MD  feeding supplement (ENSURE PLUS HIGH PROTEIN) LIQD Take 237 mLs by mouth 2 (two) times daily between meals. 05/26/24   Amin, Sumayya, MD  folic acid  (FOLVITE ) 1 MG tablet Take 1 tablet (1 mg total) by mouth daily. 05/27/24   Amin, Sumayya, MD  gabapentin  (NEURONTIN ) 100 MG capsule Take 2 capsules (200 mg total) by mouth 3 (three) times daily. 04/25/24   Bennett, Christal H, NP  levETIRAcetam  (KEPPRA ) 500 MG tablet Take 1 tablet (500 mg total) by mouth 2 (two) times daily. 06/17/24   Raford Lenis, MD  lipase/protease/amylase (CREON ) 36000 UNITS CPEP capsule Take 1 capsule (36,000 Units total) by mouth 3 (three) times daily  before meals. 05/26/24   Amin, Sumayya, MD  Multiple Vitamin (MULTIVITAMIN WITH MINERALS) TABS tablet Take 1 tablet by mouth daily. 05/26/24   Amin, Sumayya, MD  nicotine  (NICODERM CQ  - DOSED IN MG/24 HOURS) 14 mg/24hr patch Place 1 patch (14 mg total) onto the skin daily. 04/26/24   Blair Robin H, NP  oxyCODONE  (ROXICODONE ) 5 MG immediate release tablet Take 1 tablet (5 mg total) by mouth every 4 (four) hours as needed for severe pain (pain score 7-10). 06/17/24   Raford Lenis, MD  pantoprazole  (PROTONIX ) 40 MG tablet Take 1 tablet (40 mg total) by mouth 2 (two) times daily. Patient taking differently: Take 40 mg by mouth 2 (two) times daily as needed (Acid reflux). 03/05/24   Dorinda Drue DASEN, MD  PHENobarbital  (LUMINAL) 32.4 MG tablet Take 1 tablet (32.4 mg total) by mouth 3 (three) times daily for 6 doses. 06/17/24 06/19/24  Raford Lenis, MD  QUEtiapine  (SEROQUEL ) 50 MG tablet Take 1 tablet (50 mg total) by mouth at bedtime. Patient not taking: Reported on 05/19/2024 04/25/24   Blair Robin H, NP  thiamine  (VITAMIN B-1) 100 MG tablet Take 1 tablet (100 mg total) by mouth daily. 05/26/24   Amin, Sumayya, MD  famotidine  (PEPCID ) 20 MG tablet Take 1 tablet (20 mg total) by mouth 2 (two) times daily. Patient not taking: Reported on 06/01/2019 05/14/19 06/02/19  [name], MD    Allergies: Risperidone  and paliperidone, Tegretol  [carbamazepine ], and Caffeine  Review of Systems  Constitutional:  Positive for activity change and appetite change. Negative for fever.  HENT:  Negative for congestion and rhinorrhea.   Respiratory:  Negative for cough, chest tightness and shortness of breath.   Cardiovascular:  Negative for chest pain.  Gastrointestinal:  Positive for abdominal pain, nausea and vomiting.  Genitourinary:  Negative for dysuria and hematuria.  Musculoskeletal:  Negative for arthralgias, back pain and myalgias.  Skin:  Negative for rash.  Neurological:  Negative for dizziness,  weakness and headaches.   all other systems are negative except as noted in the HPI and PMH.    Updated Vital Signs BP 102/79 (BP Location: Left Arm)   Pulse 97   Temp 98.6 F (37 C)   Resp 20   Ht 6' (1.829 m)   SpO2 99%   BMI 18.36 kg/m   Physical Exam Vitals and nursing note reviewed.  Constitutional:      General: He is not in acute distress.    Appearance: He is well-developed.     Comments: Chronic ill-appearing, mildly tremulous  HENT:     Head: Normocephalic and atraumatic.     Mouth/Throat:     Pharynx: No oropharyngeal exudate.  Eyes:     Conjunctiva/sclera: Conjunctivae normal.     Pupils: Pupils are equal, round, and reactive to light.  Neck:     Comments: No meningismus. Cardiovascular:     Rate and Rhythm: Normal rate and regular rhythm.     Heart sounds: Normal heart sounds. No murmur heard. Pulmonary:     Effort: Pulmonary effort is normal. No respiratory distress.     Breath sounds: Normal breath sounds.  Abdominal:     Palpations: Abdomen is soft.     Tenderness: There is abdominal tenderness. There is guarding. There is no rebound.     Comments: Diffuse abdominal tenderness, guarding in epigastrium and periumbilical  Musculoskeletal:        General: No tenderness. Normal range of motion.     Cervical back: Normal range of motion and neck supple.  Skin:    General: Skin is warm.  Neurological:     Mental Status: He is alert and oriented to person, place, and time.     Cranial Nerves: No cranial nerve deficit.     Motor: No abnormal muscle tone.     Coordination: Coordination normal.     Comments:  5/5 strength throughout. CN 2-12 intact.Equal grip strength.   Psychiatric:        Behavior: Behavior normal.     (all labs ordered are listed, but only abnormal results are displayed) Labs Reviewed  CBC WITH DIFFERENTIAL/PLATELET - Abnormal; Notable for the following components:      Result Value   WBC 3.3 (*)    RBC 3.64 (*)    Hemoglobin  10.7 (*)    HCT 31.2 (*)    RDW 18.8 (*)    Platelets 84 (*)    Neutro Abs 1.0 (*)    All other components within normal limits  COMPREHENSIVE METABOLIC PANEL WITH GFR - Abnormal; Notable for the following components:   CO2 20 (*)    Creatinine, Ser 0.57 (*)    Anion gap 16 (*)    All other components within normal limits  LIPASE, BLOOD - Abnormal; Notable for the following components:   Lipase 254 (*)    All other components within normal limits  ETHANOL - Abnormal; Notable for the following components:   Alcohol , Ethyl (B)  285 (*)    All other components within normal limits  URINALYSIS, ROUTINE W REFLEX MICROSCOPIC  DRUG SCREEN 10 W/CONF, SERUM    EKG: None  Radiology: No results found.   Procedures   Medications Ordered in the ED  lactated ringers  bolus 1,000 mL (has no administration in time range)  ondansetron  (ZOFRAN ) injection 4 mg (has no administration in time range)  midazolam  (VERSED ) injection 1-2 mg (has no administration in time range)  HYDROmorphone  (DILAUDID ) injection 1 mg (has no administration in time range)                                    Medical Decision Making Amount and/or Complexity of Data Reviewed Labs: ordered. Decision-making details documented in ED Course. Radiology: ordered and independent interpretation performed. Decision-making details documented in ED Course. ECG/medicine tests: ordered and independent interpretation performed. Decision-making details documented in ED Course.  Risk Prescription drug management. Decision regarding hospitalization.   Chronic abdominal pain with history of alcohol  abuse and pancreatitis.  Here with abdominal pain with nausea and vomiting.  Stable vitals.  Soft but tender without peritoneal signs.  Will hydrate, treat symptoms, CIWA protocol, phenobarbital , benzodiazepines as needed.  Will check labs.  Did have CT scan 6 days ago that showed a large pseudocyst  Initial CIWA score is 21.   Patient given IV fluids, phenobarbital  and Versed .  Labs show stable anemia.  Alcohol  intoxication noted.  Lipase up to 85.  LFTs are at baseline.  Worsening thrombocytopenia likely secondary to alcohol  use.  Stable anemia and leukopenia  CIWA score improving to 3 after phenobarbital  and Versed .  Continues to complain of abdominal pain but no vomiting or diarrhea.  Difficult IV access with patient adamant that the IV must be in his upper arm. Multiple members of nursing staff placed several IVs which patient states were uncomfortable and demanded to be removed.  Plan admission for ongoing alcohol  withdrawal and acute on chronic pancreatitis.  Continue IV fluids, pain control, CIWA protocol. Vitals remained stable throughout ED course.  No vomiting    Final diagnoses:  None    ED Discharge Orders     None          Teryn Gust, Garnette, MD 06/23/24 0345

## 2024-06-23 DIAGNOSIS — Z888 Allergy status to other drugs, medicaments and biological substances status: Secondary | ICD-10-CM | POA: Diagnosis not present

## 2024-06-23 DIAGNOSIS — Z21 Asymptomatic human immunodeficiency virus [HIV] infection status: Secondary | ICD-10-CM | POA: Diagnosis present

## 2024-06-23 DIAGNOSIS — F419 Anxiety disorder, unspecified: Secondary | ICD-10-CM | POA: Diagnosis present

## 2024-06-23 DIAGNOSIS — Z8249 Family history of ischemic heart disease and other diseases of the circulatory system: Secondary | ICD-10-CM | POA: Diagnosis not present

## 2024-06-23 DIAGNOSIS — E872 Acidosis, unspecified: Secondary | ICD-10-CM | POA: Diagnosis present

## 2024-06-23 DIAGNOSIS — K852 Alcohol induced acute pancreatitis without necrosis or infection: Secondary | ICD-10-CM | POA: Diagnosis present

## 2024-06-23 DIAGNOSIS — D649 Anemia, unspecified: Secondary | ICD-10-CM | POA: Diagnosis present

## 2024-06-23 DIAGNOSIS — F10230 Alcohol dependence with withdrawal, uncomplicated: Secondary | ICD-10-CM | POA: Diagnosis not present

## 2024-06-23 DIAGNOSIS — F17200 Nicotine dependence, unspecified, uncomplicated: Secondary | ICD-10-CM

## 2024-06-23 DIAGNOSIS — K861 Other chronic pancreatitis: Secondary | ICD-10-CM | POA: Diagnosis present

## 2024-06-23 DIAGNOSIS — E8729 Other acidosis: Secondary | ICD-10-CM

## 2024-06-23 DIAGNOSIS — K859 Acute pancreatitis without necrosis or infection, unspecified: Secondary | ICD-10-CM | POA: Diagnosis present

## 2024-06-23 DIAGNOSIS — D61818 Other pancytopenia: Secondary | ICD-10-CM

## 2024-06-23 DIAGNOSIS — G40909 Epilepsy, unspecified, not intractable, without status epilepticus: Secondary | ICD-10-CM | POA: Diagnosis present

## 2024-06-23 DIAGNOSIS — Z800 Family history of malignant neoplasm of digestive organs: Secondary | ICD-10-CM | POA: Diagnosis not present

## 2024-06-23 DIAGNOSIS — F3181 Bipolar II disorder: Secondary | ICD-10-CM | POA: Diagnosis present

## 2024-06-23 DIAGNOSIS — K219 Gastro-esophageal reflux disease without esophagitis: Secondary | ICD-10-CM | POA: Diagnosis present

## 2024-06-23 DIAGNOSIS — F10229 Alcohol dependence with intoxication, unspecified: Secondary | ICD-10-CM | POA: Diagnosis present

## 2024-06-23 DIAGNOSIS — F17210 Nicotine dependence, cigarettes, uncomplicated: Secondary | ICD-10-CM | POA: Diagnosis present

## 2024-06-23 DIAGNOSIS — K869 Disease of pancreas, unspecified: Secondary | ICD-10-CM

## 2024-06-23 DIAGNOSIS — Z79899 Other long term (current) drug therapy: Secondary | ICD-10-CM | POA: Diagnosis not present

## 2024-06-23 DIAGNOSIS — Z716 Tobacco abuse counseling: Secondary | ICD-10-CM | POA: Diagnosis not present

## 2024-06-23 DIAGNOSIS — Z811 Family history of alcohol abuse and dependence: Secondary | ICD-10-CM | POA: Diagnosis not present

## 2024-06-23 DIAGNOSIS — F418 Other specified anxiety disorders: Secondary | ICD-10-CM

## 2024-06-23 DIAGNOSIS — D72819 Decreased white blood cell count, unspecified: Secondary | ICD-10-CM | POA: Diagnosis present

## 2024-06-23 DIAGNOSIS — D6959 Other secondary thrombocytopenia: Secondary | ICD-10-CM | POA: Diagnosis present

## 2024-06-23 DIAGNOSIS — Y908 Blood alcohol level of 240 mg/100 ml or more: Secondary | ICD-10-CM | POA: Diagnosis present

## 2024-06-23 DIAGNOSIS — K863 Pseudocyst of pancreas: Secondary | ICD-10-CM | POA: Diagnosis present

## 2024-06-23 DIAGNOSIS — F10930 Alcohol use, unspecified with withdrawal, uncomplicated: Secondary | ICD-10-CM | POA: Diagnosis not present

## 2024-06-23 DIAGNOSIS — F10239 Alcohol dependence with withdrawal, unspecified: Secondary | ICD-10-CM | POA: Diagnosis present

## 2024-06-23 LAB — CBC WITH DIFFERENTIAL/PLATELET
Abs Immature Granulocytes: 0.01 K/uL (ref 0.00–0.07)
Basophils Absolute: 0 K/uL (ref 0.0–0.1)
Basophils Relative: 1 %
Eosinophils Absolute: 0.1 K/uL (ref 0.0–0.5)
Eosinophils Relative: 4 %
HCT: 31.2 % — ABNORMAL LOW (ref 39.0–52.0)
Hemoglobin: 10.7 g/dL — ABNORMAL LOW (ref 13.0–17.0)
Immature Granulocytes: 0 %
Lymphocytes Relative: 59 %
Lymphs Abs: 1.9 K/uL (ref 0.7–4.0)
MCH: 29.4 pg (ref 26.0–34.0)
MCHC: 34.3 g/dL (ref 30.0–36.0)
MCV: 85.7 fL (ref 80.0–100.0)
Monocytes Absolute: 0.2 K/uL (ref 0.1–1.0)
Monocytes Relative: 6 %
Neutro Abs: 1 K/uL — ABNORMAL LOW (ref 1.7–7.7)
Neutrophils Relative %: 30 %
Platelets: 84 K/uL — ABNORMAL LOW (ref 150–400)
RBC: 3.64 MIL/uL — ABNORMAL LOW (ref 4.22–5.81)
RDW: 18.8 % — ABNORMAL HIGH (ref 11.5–15.5)
WBC: 3.3 K/uL — ABNORMAL LOW (ref 4.0–10.5)
nRBC: 0 % (ref 0.0–0.2)

## 2024-06-23 LAB — COMPREHENSIVE METABOLIC PANEL WITH GFR
ALT: 18 U/L (ref 0–44)
AST: 41 U/L (ref 15–41)
Albumin: 4.3 g/dL (ref 3.5–5.0)
Alkaline Phosphatase: 63 U/L (ref 38–126)
Anion gap: 16 — ABNORMAL HIGH (ref 5–15)
BUN: 6 mg/dL (ref 6–20)
CO2: 20 mmol/L — ABNORMAL LOW (ref 22–32)
Calcium: 8.9 mg/dL (ref 8.9–10.3)
Chloride: 103 mmol/L (ref 98–111)
Creatinine, Ser: 0.57 mg/dL — ABNORMAL LOW (ref 0.61–1.24)
GFR, Estimated: >60 mL/min (ref >60)
Glucose, Bld: 90 mg/dL (ref 70–99)
Potassium: 3.7 mmol/L (ref 3.5–5.1)
Sodium: 139 mmol/L (ref 135–145)
Total Bilirubin: 0.5 mg/dL (ref 0.0–1.2)
Total Protein: 7 g/dL (ref 6.5–8.1)

## 2024-06-23 LAB — ETHANOL: Alcohol, Ethyl (B): 285 mg/dL — ABNORMAL HIGH (ref <15)

## 2024-06-23 LAB — LIPASE, BLOOD: Lipase: 254 U/L — ABNORMAL HIGH (ref 11–51)

## 2024-06-23 LAB — PHOSPHORUS: Phosphorus: 3.6 mg/dL (ref 2.5–4.6)

## 2024-06-23 LAB — MAGNESIUM: Magnesium: 1.6 mg/dL — ABNORMAL LOW (ref 1.7–2.4)

## 2024-06-23 MED ORDER — BICTEGRAVIR-EMTRICITAB-TENOFOV 50-200-25 MG PO TABS: 1.0000 | ORAL_TABLET | Freq: Every day | ORAL | Status: DC

## 2024-06-23 MED ORDER — THIAMINE MONONITRATE 100 MG PO TABS
100.0000 mg | ORAL_TABLET | Freq: Every day | ORAL | Status: DC
  Administered 2024-06-23 – 2024-06-28 (×6): 100 mg via ORAL
  Filled 2024-06-23 (×6): qty 1

## 2024-06-23 MED ORDER — LACTATED RINGERS IV SOLN: INTRAVENOUS | Status: AC

## 2024-06-23 MED ORDER — ONDANSETRON HCL 4 MG/2ML IJ SOLN
4.0000 mg | Freq: Four times a day (QID) | INTRAMUSCULAR | Status: DC | PRN
  Administered 2024-06-23 – 2024-06-24 (×2): 4 mg via INTRAVENOUS
  Filled 2024-06-23 (×2): qty 2

## 2024-06-23 MED ORDER — THIAMINE HCL 100 MG/ML IJ SOLN
100.0000 mg | Freq: Every day | INTRAMUSCULAR | Status: DC
  Filled 2024-06-23: qty 2

## 2024-06-23 MED ORDER — MAGNESIUM SULFATE 2 GM/50ML IV SOLN
2.0000 g | Freq: Once | INTRAVENOUS | Status: AC
  Administered 2024-06-23: 2 g via INTRAVENOUS
  Filled 2024-06-23: qty 50

## 2024-06-23 MED ORDER — ONDANSETRON HCL 4 MG PO TABS: 4.0000 mg | ORAL_TABLET | Freq: Four times a day (QID) | ORAL | Status: DC | PRN

## 2024-06-23 MED ORDER — PANTOPRAZOLE SODIUM 40 MG PO TBEC
40.0000 mg | DELAYED_RELEASE_TABLET | Freq: Every day | ORAL | Status: DC
  Administered 2024-06-23 – 2024-06-28 (×6): 40 mg via ORAL
  Filled 2024-06-23 (×6): qty 1

## 2024-06-23 MED ORDER — DOLUTEGRAVIR SODIUM 50 MG PO TABS
50.0000 mg | ORAL_TABLET | Freq: Two times a day (BID) | ORAL | Status: DC
  Administered 2024-06-23 – 2024-06-28 (×11): 50 mg via ORAL
  Filled 2024-06-23 (×13): qty 1

## 2024-06-23 MED ORDER — ALBUTEROL SULFATE (2.5 MG/3ML) 0.083% IN NEBU: 2.5000 mg | INHALATION_SOLUTION | Freq: Four times a day (QID) | RESPIRATORY_TRACT | Status: DC | PRN

## 2024-06-23 MED ORDER — LACTATED RINGERS IV SOLN: INTRAVENOUS | Status: DC

## 2024-06-23 MED ORDER — PHENOBARBITAL SODIUM 65 MG/ML IJ SOLN
32.5000 mg | Freq: Three times a day (TID) | INTRAMUSCULAR | Status: DC
  Administered 2024-06-27 – 2024-06-28 (×3): 32.5 mg via INTRAVENOUS
  Filled 2024-06-23 (×4): qty 1

## 2024-06-23 MED ORDER — EMTRICITABINE-TENOFOVIR DF 200-300 MG PO TABS
1.0000 | ORAL_TABLET | Freq: Every day | ORAL | Status: DC
  Administered 2024-06-23 – 2024-06-28 (×6): 1 via ORAL
  Filled 2024-06-23 (×7): qty 1

## 2024-06-23 MED ORDER — ADULT MULTIVITAMIN W/MINERALS CH
1.0000 | ORAL_TABLET | Freq: Every day | ORAL | Status: DC
  Administered 2024-06-23 – 2024-06-28 (×6): 1 via ORAL
  Filled 2024-06-23 (×6): qty 1

## 2024-06-23 MED ORDER — SODIUM CHLORIDE 0.9% FLUSH
3.0000 mL | Freq: Two times a day (BID) | INTRAVENOUS | Status: DC
  Administered 2024-06-24 – 2024-06-28 (×7): 3 mL via INTRAVENOUS

## 2024-06-23 MED ORDER — PANCRELIPASE (LIP-PROT-AMYL) 36000-114000 UNITS PO CPEP
36000.0000 [IU] | ORAL_CAPSULE | Freq: Three times a day (TID) | ORAL | Status: DC
  Administered 2024-06-23 – 2024-06-28 (×15): 36000 [IU] via ORAL
  Filled 2024-06-23 (×17): qty 1

## 2024-06-23 MED ORDER — PHENOBARBITAL SODIUM 130 MG/ML IJ SOLN
97.5000 mg | Freq: Three times a day (TID) | INTRAMUSCULAR | Status: AC
  Administered 2024-06-23 – 2024-06-25 (×6): 97.5 mg via INTRAVENOUS
  Filled 2024-06-23 (×6): qty 1

## 2024-06-23 MED ORDER — HYDROMORPHONE HCL 1 MG/ML IJ SOLN
1.0000 mg | Freq: Once | INTRAMUSCULAR | Status: AC
  Administered 2024-06-23: 1 mg via INTRAVENOUS
  Filled 2024-06-23: qty 1

## 2024-06-23 MED ORDER — FOLIC ACID 1 MG PO TABS
1.0000 mg | ORAL_TABLET | Freq: Every day | ORAL | Status: DC
  Administered 2024-06-23 – 2024-06-28 (×6): 1 mg via ORAL
  Filled 2024-06-23 (×6): qty 1

## 2024-06-23 MED ORDER — OXYCODONE HCL 5 MG PO TABS
5.0000 mg | ORAL_TABLET | ORAL | Status: DC | PRN
  Administered 2024-06-23 – 2024-06-25 (×5): 5 mg via ORAL
  Filled 2024-06-23 (×9): qty 1

## 2024-06-23 MED ORDER — PHENOBARBITAL SODIUM 65 MG/ML IJ SOLN
65.0000 mg | Freq: Three times a day (TID) | INTRAMUSCULAR | Status: AC
  Administered 2024-06-25 – 2024-06-27 (×6): 65 mg via INTRAVENOUS
  Filled 2024-06-23 (×6): qty 1

## 2024-06-23 MED ORDER — HYDROMORPHONE HCL 1 MG/ML IJ SOLN
0.5000 mg | INTRAMUSCULAR | Status: DC | PRN
  Administered 2024-06-23 – 2024-06-28 (×18): 1 mg via INTRAVENOUS
  Filled 2024-06-23 (×18): qty 1

## 2024-06-23 NOTE — Plan of Care (Signed)

## 2024-06-23 NOTE — ED Notes (Signed)
 ED Provider at bedside.

## 2024-06-23 NOTE — ED Notes (Signed)
 Dr. Carita attempted IV u/s times 2 without success. This RN stuck patient to right upper arm anterior arm. Blood return noted. No swelling when flushed. Pt states IV feels ok.

## 2024-06-23 NOTE — Progress Notes (Signed)
 Pt had no orders for medication and we needed clarification on neuro checks, sent MD a message. See MAR.

## 2024-06-23 NOTE — H&P (Addendum)
 History and Physical    Patient: Gary [name] FMW:[id number] DOB: 1986/10/01 DOA: 06/22/2024 DOS: the patient was seen and examined on 06/23/2024 PCP: Patient, No Pcp Per  Patient coming from: Home  Chief Complaint:  Chief Complaint  Patient presents with   Abdominal Pain   HPI: DONIE LEMELIN is a 38 y.o. male with medical history significant of HIV, subdural hematoma, alcoholic pancreatitis, alcohol  abuse with history of DTs and withdrawal seizures, portal vein thrombosis, anxiety, depression, bipolar disorder, tobacco abuse, and GERD presents with presents with abdominal pain and nausea.  He experiences sharp abdominal pain located above the umbilicus, initially starting on the left side and then spreading throughout the abdomen. The pain is described as very sharp and intense. Associated symptoms include nausea and vomiting. The vomit is clear, with no current hematemesis, although he mentions having experienced blood in his vomit in the past.  He has a history of significant alcohol  use, currently reduced to less than a twelve pack of beer per day, having previously consumed two bottles of vodka daily. He is attempting to stop drinking altogether. He has been having increased tremor.  His current medications include Biktarvy  and pancreatolipase, which he takes before meals.  In the emergency department patient was noted to have stable vital signs.  Labs significant for lipase 254, alcohol  level 285, WBC 3.3, hemoglobin 10.7, platelets 84, CO2 20, anion gap 16, AST 41, ALT 18.  Patient had been given Dilaudid  IV for pain, 1 L bolus of lactated Ringer 's, Zofran , and given phenobarbital  IV with taper dosing by pharmacy.  Review of Systems: As mentioned in the history of present illness. All other systems reviewed and are negative. Past Medical History:  Diagnosis Date   Alcohol  abuse    Alcohol -induced pancreatitis 04/16/2022   Anxiety    Bipolar 2 disorder (HCC)    HIV (human  immunodeficiency virus infection) (HCC)    Pancreatitis    PTSD (post-traumatic stress disorder)    Schizophrenia (HCC)    Seizures (HCC)    Subdural hematoma (HCC)    Past Surgical History:  Procedure Laterality Date   BIOPSY  04/19/2022   Procedure: BIOPSY;  Surgeon: Wilhelmenia Aloha Raddle., MD;  Location: [facility] ENDOSCOPY;  Service: Gastroenterology;;   ENTEROSCOPY N/A 04/19/2022   Procedure: ENTEROSCOPY;  Surgeon: Wilhelmenia Aloha Raddle., MD;  Location: [facility] ENDOSCOPY;  Service: Gastroenterology;  Laterality: N/A;   INCISION AND DRAINAGE PERIRECTAL ABSCESS N/A 09/24/2016   Procedure: IRRIGATION AND DEBRIDEMENT PERIRECTAL ABSCESS;  Surgeon: Carlin Pastel, MD;  Location: [facility] ORS;  Service: General;  Laterality: N/A;   none     Social History:  reports that he has been smoking cigarettes. He started smoking about 21 years ago. He has a 10.8 pack-year smoking history. He has never used smokeless tobacco. He reports current alcohol  use of about 105.0 standard drinks of alcohol  per week. He reports that he does not currently use drugs after having used the following drugs: Methamphetamines and Cocaine.  Allergies  Allergen Reactions   Risperidone  And Paliperidone Other (See Comments)    Patient states causes opposite effect (he will not take again)   Tegretol  [Carbamazepine ] Other (See Comments)    Vertigo  Paralysis   Caffeine Palpitations    Family History  Problem Relation Age of Onset   Hypertension Mother    Alcohol  abuse Mother    Alcoholism Mother    Alcohol  abuse Father    Colon cancer Other    Other Other  Cancer Other     Prior to Admission medications   Medication Sig Start Date End Date Taking? Authorizing Provider  bictegravir-emtricitabine -tenofovir  AF (BIKTARVY ) 50-200-25 MG TABS tablet Take 1 tablet by mouth daily. 02/24/24   Von Bellis, MD  feeding supplement (ENSURE PLUS HIGH PROTEIN) LIQD Take 237 mLs by mouth 2 (two) times daily between meals. 05/26/24    Caleen Qualia, MD  folic acid  (FOLVITE ) 1 MG tablet Take 1 tablet (1 mg total) by mouth daily. 05/27/24   Amin, Sumayya, MD  gabapentin  (NEURONTIN ) 100 MG capsule Take 2 capsules (200 mg total) by mouth 3 (three) times daily. 04/25/24   Bennett, Christal H, NP  levETIRAcetam  (KEPPRA ) 500 MG tablet Take 1 tablet (500 mg total) by mouth 2 (two) times daily. 06/17/24   Raford Lenis, MD  lipase/protease/amylase (CREON ) 36000 UNITS CPEP capsule Take 1 capsule (36,000 Units total) by mouth 3 (three) times daily before meals. 05/26/24   Amin, Sumayya, MD  Multiple Vitamin (MULTIVITAMIN WITH MINERALS) TABS tablet Take 1 tablet by mouth daily. 05/26/24   Amin, Sumayya, MD  nicotine  (NICODERM CQ  - DOSED IN MG/24 HOURS) 14 mg/24hr patch Place 1 patch (14 mg total) onto the skin daily. 04/26/24   Blair, Christal H, NP  oxyCODONE  (ROXICODONE ) 5 MG immediate release tablet Take 1 tablet (5 mg total) by mouth every 4 (four) hours as needed for severe pain (pain score 7-10). 06/17/24   Raford Lenis, MD  pantoprazole  (PROTONIX ) 40 MG tablet Take 1 tablet (40 mg total) by mouth 2 (two) times daily. Patient taking differently: Take 40 mg by mouth 2 (two) times daily as needed (Acid reflux). 03/05/24   Dorinda Drue DASEN, MD  PHENobarbital  (LUMINAL) 32.4 MG tablet Take 1 tablet (32.4 mg total) by mouth 3 (three) times daily for 6 doses. 06/17/24 06/19/24  Raford Lenis, MD  QUEtiapine  (SEROQUEL ) 50 MG tablet Take 1 tablet (50 mg total) by mouth at bedtime. Patient not taking: Reported on 05/19/2024 04/25/24   Blair Robin H, NP  thiamine  (VITAMIN B-1) 100 MG tablet Take 1 tablet (100 mg total) by mouth daily. 05/26/24   Amin, Sumayya, MD  famotidine  (PEPCID ) 20 MG tablet Take 1 tablet (20 mg total) by mouth 2 (two) times daily. Patient not taking: Reported on 06/01/2019 05/14/19 06/02/19  [name], MD    Physical Exam: Vitals:   06/23/24 0254 06/23/24 0300 06/23/24 0335 06/23/24 0423  BP: 102/88 112/88 113/74 121/84   Pulse: 81 75 68 83  Resp:   14 17  Temp:   98 F (36.7 C) 98.3 F (36.8 C)  TempSrc:   Oral Oral  SpO2: 100% 100% 97% 92%  Weight:    68.4 kg  Height:    6' (1.829 m)   Constitutional: Middle-aged, who appears to be in distress Eyes: PERRL, lids and conjunctivae normal ENMT: Mucous membranes are moist. Posterior pharynx clear of any exudate or lesions.Normal dentition.  Neck: normal, supple, no masses, no thyromegaly Respiratory: clear to auscultation bilaterally, no wheezing, no crackles. Normal respiratory effort. No accessory muscle use.  Cardiovascular: Tachycardic.  No extremity edema. 2+ pedal pulses. No carotid bruits.  Abdomen: Epigastric tenderness appreciated.  Bowel sounds positive.  Musculoskeletal: no clubbing / cyanosis. No joint deformity upper and lower extremities. Good ROM, no contractures. Normal muscle tone.  Skin: no rashes, lesions, ulcers. No induration Neurologic: CN 2-12 grossly intact.   Strength 5/5 in all 4.  Tremor present. Psychiatric: Normal judgment and insight. Alert and oriented x 3.  Normal mood.   Data Reviewed:  Normal sinus rhythm at 70 bpm with QTc stable.  Reviewed labs, imaging, and pertinent records as documented.  Assessment and Plan:  Recurrent pancreatitis  Acute on chronic.  Patient presents with complaints of abdominal pain.  Lipase elevated at 254.  Review of records note CT scan of the abdomen pelvis with contrast done on 8/8 noted poorly visualized pancreatic head and neck hypodensity measuring up to 2.5 cm with slightly irregular edematous proximal pancreatic parenchyma suggestive of acute pancreatitis with developing pseudocyst not excluded.  Patient having given IV fluids and pain medication.  Thought likely provoked by patient's continued alcohol  use. - Admit to a telemetry bed - Clear liquid diet and advance as tolerated - Continue Creon  - Lactated Ringer 's at 150 mL/h - Oxycodone /Dilaudid  IV as needed for moderate to severe  pain respectively  Alcohol  dependence with withdrawals On admission alcohol  level 285. History of delirium tremor and alcohol  withdrawal seizure in the past.  Patient reports drinking 12 pack of beer which he feels is cutting back from prior amounts that he drinks. - CIWA protocols initiated - Seizure precautions - Phenobarbital  with tapering per pharmacy - Versed  as needed for seizure activity - Transitions of care consulted  Metabolic acidosis with elevated anion gap Acute.  CO2 noted to be 20 with anion gap of 16.  Thought secondary to alcohol . - Continue to monitor for correction  Pancytopenia Acute.  Initial labs noted WBC 3.3, hemoglobin 10.7, and platelet count 84.  All 3 cell lines appear acutely down from baseline.  No reports of bleeding. - Continue to monitor  HIV Viral load less than 20 on 01/28/2024, CD4 was 598 on 12/08/23.  - Biktarvy  substituted by pharmacy due to patient being on phenobarbital    Bipolar 2 disorder Depression and anxiety Currently not current on treatment.  Pancreatic lesion Per review of records patient has prior imaging back in July with concern of small progression of a heterogeneous area in pancreatic head, likely due to persistent pancreatitis, but was noted of concern to rule out possibility of malignancy.  CA 19-9 was obtained and noted to be low at 3. - May consider discussing with GI if still needs dedicated pancreatic MRI once patient's acute symptoms are under better control with CA 19-9 being low  Tobacco use disorder - Continue counseling about need of cessation of substance abuse - Nicotine  patch - Check UDS  GERD  - Protonix   Housing insecurity - TOC   DVT prophylaxis: SCDs Advance Care Planning:   Code Status: Full Code   Consults: None  Family Communication:    Severity of Illness: The appropriate patient status for this patient is INPATIENT. Inpatient status is judged to be reasonable and necessary in order to provide  the required intensity of service to ensure the patient's safety. The patient's presenting symptoms, physical exam findings, and initial radiographic and laboratory data in the context of their chronic comorbidities is felt to place them at high risk for further clinical deterioration. Furthermore, it is not anticipated that the patient will be medically stable for discharge from the hospital within 2 midnights of admission.   * I certify that at the point of admission it is my clinical judgment that the patient will require inpatient hospital care spanning beyond 2 midnights from the point of admission due to high intensity of service, high risk for further deterioration and high frequency of surveillance required.*  Author: Maximino DELENA Sharps, MD 06/23/2024 7:21 AM  For  on call review [email or web address].

## 2024-06-23 NOTE — ED Notes (Signed)
 Attempted to stick patient for IV 3 times. Unsuccessful. Patient adamant that IV needs to be in upper arm. MD made aware.

## 2024-06-23 NOTE — Plan of Care (Signed)

## 2024-06-24 ENCOUNTER — Other Ambulatory Visit (HOSPITAL_COMMUNITY): Payer: Self-pay

## 2024-06-24 DIAGNOSIS — D61818 Other pancytopenia: Secondary | ICD-10-CM | POA: Diagnosis not present

## 2024-06-24 DIAGNOSIS — E8729 Other acidosis: Secondary | ICD-10-CM | POA: Diagnosis not present

## 2024-06-24 DIAGNOSIS — F10230 Alcohol dependence with withdrawal, uncomplicated: Secondary | ICD-10-CM | POA: Diagnosis not present

## 2024-06-24 DIAGNOSIS — K859 Acute pancreatitis without necrosis or infection, unspecified: Secondary | ICD-10-CM | POA: Diagnosis not present

## 2024-06-24 LAB — MAGNESIUM: Magnesium: 1.7 mg/dL (ref 1.7–2.4)

## 2024-06-24 LAB — COMPREHENSIVE METABOLIC PANEL WITH GFR
ALT: 17 U/L (ref 0–44)
AST: 27 U/L (ref 15–41)
Albumin: 3.5 g/dL (ref 3.5–5.0)
Alkaline Phosphatase: 51 U/L (ref 38–126)
Anion gap: 9 (ref 5–15)
BUN: <5 mg/dL — ABNORMAL LOW (ref 6–20)
CO2: 23 mmol/L (ref 22–32)
Calcium: 8.8 mg/dL — ABNORMAL LOW (ref 8.9–10.3)
Chloride: 99 mmol/L (ref 98–111)
Creatinine, Ser: 0.6 mg/dL — ABNORMAL LOW (ref 0.61–1.24)
GFR, Estimated: >60 mL/min (ref >60)
Glucose, Bld: 72 mg/dL (ref 70–99)
Potassium: 3.7 mmol/L (ref 3.5–5.1)
Sodium: 131 mmol/L — ABNORMAL LOW (ref 135–145)
Total Bilirubin: 1.3 mg/dL — ABNORMAL HIGH (ref 0.0–1.2)
Total Protein: 6.1 g/dL — ABNORMAL LOW (ref 6.5–8.1)

## 2024-06-24 LAB — CBC
HCT: 31.9 % — ABNORMAL LOW (ref 39.0–52.0)
Hemoglobin: 10.4 g/dL — ABNORMAL LOW (ref 13.0–17.0)
MCH: 28.5 pg (ref 26.0–34.0)
MCHC: 32.6 g/dL (ref 30.0–36.0)
MCV: 87.4 fL (ref 80.0–100.0)
Platelets: 55 K/uL — ABNORMAL LOW (ref 150–400)
RBC: 3.65 MIL/uL — ABNORMAL LOW (ref 4.22–5.81)
RDW: 18.6 % — ABNORMAL HIGH (ref 11.5–15.5)
WBC: 2.4 K/uL — ABNORMAL LOW (ref 4.0–10.5)
nRBC: 0 % (ref 0.0–0.2)

## 2024-06-24 NOTE — Progress Notes (Signed)
 [facility]  PROGRESS NOTE  Gary [name] FMW:[id number] DOB: 19-Oct-1986 DOA: 06/22/2024 PCP: Patient, No Pcp Per   Brief HPI:     38 y.o. male with medical history significant of HIV, subdural hematoma, alcoholic pancreatitis, alcohol  abuse with history of DTs and withdrawal seizures, portal vein thrombosis, anxiety, depression, bipolar disorder, tobacco abuse, and GERD presents with presents with abdominal pain and nausea.     Assessment/Plan:   Recurrent pancreatitis  Acute on chronic.  Patient presents with complaints of abdominal pain.  Lipase elevated at 254.  Review of records note CT scan of the abdomen pelvis with contrast done on 8/8 noted poorly visualized pancreatic head and neck hypodensity measuring up to 2.5 cm with slightly irregular edematous proximal pancreatic parenchyma suggestive of acute pancreatitis with developing pseudocyst not excluded.   - Continue IV fluids, pain control with IV Dilaudid  -Started on clear liquid diet   Alcohol  dependence with withdrawal On admission alcohol  level 285. History of delirium tremens and alcohol  withdrawal seizure in the past.  Patient reports drinking 12 pack of beer which he feels is cutting back from prior amounts that he drinks. - CIWA protocol initiated - Seizure precautions - Phenobarbital  with tapering per pharmacy - Versed  as needed for seizure activity - Transitions of care consulted   Metabolic acidosis with elevated anion gap -Resolved with IV fluids   Pancytopenia Acute.  Initial labs noted WBC 3.3, hemoglobin 10.7, and platelet count 84.  All 3 cell lines appear acutely down from baseline.  No reports of bleeding. - Likely in setting of alcohol  use disorder, HIV -Continue to monitor   HIV Viral load less than 20 on 01/28/2024, CD4 was 598 on 12/08/23.  - Biktarvy  substituted by pharmacy due to patient being on phenobarbital    Bipolar 2 disorder Depression and anxiety Currently not current on  treatment.   Pancreatic lesion Per review of records patient has prior imaging back in July with concern of small progression of a heterogeneous area in pancreatic head, likely due to persistent pancreatitis, but was noted of concern to rule out possibility of malignancy.  CA 19-9 was obtained and noted to be low at 3. - May consider discussing with GI if still needs dedicated pancreatic MRI once patient's acute symptoms are under better control with CA 19-9 being low   Tobacco use disorder - Continue counseling about need of cessation of substance abuse - Nicotine  patch - Check UDS   GERD  - Protonix    Housing insecurity - TOC      Medications     [START ON 06/30/2024] bictegravir-emtricitabine -tenofovir  AF  1 tablet Oral Daily   dolutegravir   50 mg Oral BID   And   emtricitabine -tenofovir   1 tablet Oral Daily   folic acid   1 mg Oral Daily   lipase/protease/amylase  36,000 Units Oral TID AC   multivitamin with minerals  1 tablet Oral Daily   pantoprazole   40 mg Oral Daily   PHENObarbital   97.5 mg Intravenous Q8H   Followed by   [START ON 06/25/2024] PHENObarbital   65 mg Intravenous Q8H   Followed by   [START ON 06/27/2024] PHENObarbital   32.5 mg Intravenous Q8H   sodium chloride  flush  3 mL Intravenous Q12H   thiamine   100 mg Oral Daily   Or   thiamine   100 mg Intravenous Daily     Data Reviewed:   CBG:  No results for input(s): GLUCAP in the last 168 hours.  SpO2: 97 %  Vitals:   06/23/24 1835 06/23/24 2010 06/24/24 0504 06/24/24 0753  BP: 114/79 119/75 108/78 106/71  Pulse: (!) 58 64 75 62  Resp: 17   17  Temp: 98.4 F (36.9 C) 98.5 F (36.9 C) 98.6 F (37 C) 98.3 F (36.8 C)  TempSrc:      SpO2: 97% 98% 99% 97%  Weight:      Height:          Data Reviewed:  Basic Metabolic Panel: Recent Labs  Lab 06/23/24 0022 06/23/24 1026  NA 139  --   K 3.7  --   CL 103  --   CO2 20*  --   GLUCOSE 90  --   BUN 6  --   CREATININE 0.57*  --    CALCIUM  8.9  --   MG  --  1.6*  PHOS  --  3.6    CBC: Recent Labs  Lab 06/23/24 0022  WBC 3.3*  NEUTROABS 1.0*  HGB 10.7*  HCT 31.2*  MCV 85.7  PLT 84*    LFT Recent Labs  Lab 06/23/24 0022  AST 41  ALT 18  ALKPHOS 63  BILITOT 0.5  PROT 7.0  ALBUMIN 4.3     Antibiotics: Anti-infectives (From admission, onward)    Start     Dose/Rate Route Frequency Ordered Stop   06/30/24 1000  bictegravir-emtricitabine -tenofovir  AF (BIKTARVY ) 50-200-25 MG per tablet 1 tablet        1 tablet Oral Daily 06/23/24 0747     06/23/24 1400  dolutegravir  (TIVICAY ) tablet 50 mg       Placed in And Linked Group   50 mg Oral 2 times daily 06/23/24 1312 06/29/24 2359   06/23/24 1400  emtricitabine -tenofovir  (TRUVADA ) 200-300 MG per tablet 1 tablet       Placed in And Linked Group   1 tablet Oral Daily 06/23/24 1312 06/29/24 2359        DVT prophylaxis: SCDs  Code Status: Full code  Family Communication: No family at bedside   CONSULTS    Subjective   Feels better today.  Abdominal pain has improved   Objective    Physical Examination:  General-appears in no acute distress Heart-S1-S2, regular, no murmur auscultated Lungs-clear to auscultation bilaterally, no wheezing or crackles auscultated Abdomen-soft, mild tenderness to palpation in epigastric region Extremities-no edema in the lower extremities Neuro-alert, oriented x3, no focal deficit noted  Status is: Inpatient:             Gary [name] Brod   [facility] If 7PM-7AM, please contact night-coverage at [email or web address], Office  [phone]   06/24/2024, 8:11 AM  LOS: 1 day

## 2024-06-24 NOTE — Plan of Care (Signed)

## 2024-06-25 DIAGNOSIS — Z21 Asymptomatic human immunodeficiency virus [HIV] infection status: Secondary | ICD-10-CM | POA: Diagnosis not present

## 2024-06-25 DIAGNOSIS — D61818 Other pancytopenia: Secondary | ICD-10-CM | POA: Diagnosis not present

## 2024-06-25 DIAGNOSIS — K859 Acute pancreatitis without necrosis or infection, unspecified: Secondary | ICD-10-CM | POA: Diagnosis not present

## 2024-06-25 DIAGNOSIS — E8729 Other acidosis: Secondary | ICD-10-CM | POA: Diagnosis not present

## 2024-06-25 NOTE — Plan of Care (Signed)

## 2024-06-25 NOTE — Progress Notes (Signed)
 [facility]  PROGRESS NOTE  Gary [name] FMW:[id number] DOB: [birth year] DOA: 06/22/2024 PCP: Patient, No Pcp Per   Brief HPI:     38 y.o. male with medical history significant of HIV, subdural hematoma, alcoholic pancreatitis, alcohol  abuse with history of DTs and withdrawal seizures, portal vein thrombosis, anxiety, depression, bipolar disorder, tobacco abuse, and GERD presents with presents with abdominal pain and nausea.     Assessment/Plan:   Recurrent pancreatitis  Acute on chronic.   - Presented with abdominal pain, lipase 254 -  Review of records note CT scan of the abdomen pelvis with contrast done on 8/8 noted poorly visualized pancreatic head and neck hypodensity measuring up to 2.5 cm with slightly irregular edematous proximal pancreatic parenchyma suggestive of acute pancreatitis with developing pseudocyst not excluded.   - Continue IV fluids, pain control with IV Dilaudid  -Started on clear liquid diet; tolerating clear liquid diet - Will start full liquid diet -Will discuss with GI regarding further imaging needed for developing pseudocyst   Alcohol  dependence with withdrawal On admission alcohol  level 285. History of delirium tremens and alcohol  withdrawal seizure in the past.  Patient reports drinking 12 pack of beer which he feels is cutting back from prior amounts that he drinks. - CIWA protocol initiated - Seizure precautions - Phenobarbital  with tapering per pharmacy - Versed  as needed for seizure activity - Transitions of care consulted   Metabolic acidosis with elevated anion gap -Resolved with IV fluids   Pancytopenia Acute.  Initial labs noted WBC 3.3, hemoglobin 10.7, and platelet count 84.  All 3 cell lines appear acutely down from baseline.  No reports of bleeding. - Likely in setting of alcohol  use disorder, HIV -Continue to monitor   HIV Viral load less than 20 on 01/28/2024, CD4 was 598 on 12/08/23.  - Biktarvy  substituted by pharmacy due to  patient being on phenobarbital    Bipolar 2 disorder Depression and anxiety Currently not current on treatment.   Pancreatic lesion Per review of records patient has prior imaging back in July with concern of small progression of a heterogeneous area in pancreatic head, likely due to persistent pancreatitis, but was noted of concern to rule out possibility of malignancy.  CA 19-9 was obtained and noted to be low at 3. - May consider discussing with GI if still needs dedicated pancreatic MRI once patient's acute symptoms are under better control with CA 19-9 being low   Tobacco use disorder - Continue counseling about need of cessation of substance abuse - Nicotine  patch - Check UDS   GERD  - Protonix    Housing insecurity - TOC      Medications     [START ON 06/30/2024] bictegravir-emtricitabine -tenofovir  AF  1 tablet Oral Daily   dolutegravir   50 mg Oral BID   And   emtricitabine -tenofovir   1 tablet Oral Daily   folic acid   1 mg Oral Daily   lipase/protease/amylase  36,000 Units Oral TID AC   multivitamin with minerals  1 tablet Oral Daily   pantoprazole   40 mg Oral Daily   PHENObarbital   65 mg Intravenous Q8H   Followed by   [START ON 06/27/2024] PHENObarbital   32.5 mg Intravenous Q8H   sodium chloride  flush  3 mL Intravenous Q12H   thiamine   100 mg Oral Daily   Or   thiamine   100 mg Intravenous Daily     Data Reviewed:   CBG:  No results for input(s): GLUCAP in the last 168 hours.  SpO2: 98 %  Vitals:   06/24/24 0753 06/24/24 1642 06/24/24 2034 06/25/24 0432  BP: 106/71 106/71 110/74 105/68  Pulse: 62 (!) 56 (!) 58 (!) 53  Resp: 17 17 18 18   Temp: 98.3 F (36.8 C) 98.5 F (36.9 C) 98.4 F (36.9 C) 98.3 F (36.8 C)  TempSrc:   Oral Oral  SpO2: 97% 100% 97% 98%  Weight:      Height:          Data Reviewed:  Basic Metabolic Panel: Recent Labs  Lab 06/23/24 0022 06/23/24 1026 06/24/24 0713  NA 139  --  131*  K 3.7  --  3.7  CL 103  --   99  CO2 20*  --  23  GLUCOSE 90  --  72  BUN 6  --  <5*  CREATININE 0.57*  --  0.60*  CALCIUM  8.9  --  8.8*  MG  --  1.6* 1.7  PHOS  --  3.6  --     CBC: Recent Labs  Lab 06/23/24 0022 06/24/24 0713  WBC 3.3* 2.4*  NEUTROABS 1.0*  --   HGB 10.7* 10.4*  HCT 31.2* 31.9*  MCV 85.7 87.4  PLT 84* 55*    LFT Recent Labs  Lab 06/23/24 0022 06/24/24 0713  AST 41 27  ALT 18 17  ALKPHOS 63 51  BILITOT 0.5 1.3*  PROT 7.0 6.1*  ALBUMIN 4.3 3.5     Antibiotics: Anti-infectives (From admission, onward)    Start     Dose/Rate Route Frequency Ordered Stop   06/30/24 1000  bictegravir-emtricitabine -tenofovir  AF (BIKTARVY ) 50-200-25 MG per tablet 1 tablet        1 tablet Oral Daily 06/23/24 0747     06/23/24 1400  dolutegravir  (TIVICAY ) tablet 50 mg       Placed in And Linked Group   50 mg Oral 2 times daily 06/23/24 1312 06/29/24 2359   06/23/24 1400  emtricitabine -tenofovir  (TRUVADA ) 200-300 MG per tablet 1 tablet       Placed in And Linked Group   1 tablet Oral Daily 06/23/24 1312 06/29/24 2359        DVT prophylaxis: SCDs  Code Status: Full code  Family Communication: No family at bedside   CONSULTS    Subjective   Feels better this morning.  Tremors are improved.  Abdominal pain is much better.  Tolerating clear liquid diet.  Wants diet to be advanced.   Objective    Physical Examination:  Appears in no acute distress S1-S2, regular, no murmur auscultated Abdomen soft mild tenderness in epigastric region, no organomegaly Extremities-no edema in the lower extremities  Status is: Inpatient:             Gary [name]   [facility] If 7PM-7AM, please contact night-coverage at [email or web address], Office  [phone]   06/25/2024, 8:04 AM  LOS: 2 days

## 2024-06-25 NOTE — Plan of Care (Addendum)

## 2024-06-26 ENCOUNTER — Other Ambulatory Visit (HOSPITAL_COMMUNITY): Payer: Self-pay

## 2024-06-26 DIAGNOSIS — K859 Acute pancreatitis without necrosis or infection, unspecified: Secondary | ICD-10-CM | POA: Diagnosis not present

## 2024-06-26 DIAGNOSIS — D61818 Other pancytopenia: Secondary | ICD-10-CM | POA: Diagnosis not present

## 2024-06-26 DIAGNOSIS — E8729 Other acidosis: Secondary | ICD-10-CM | POA: Diagnosis not present

## 2024-06-26 DIAGNOSIS — F10930 Alcohol use, unspecified with withdrawal, uncomplicated: Secondary | ICD-10-CM

## 2024-06-26 LAB — CBC WITH DIFFERENTIAL/PLATELET
Abs Immature Granulocytes: 0 K/uL (ref 0.00–0.07)
Basophils Absolute: 0 K/uL (ref 0.0–0.1)
Basophils Relative: 0 %
Eosinophils Absolute: 0.1 K/uL (ref 0.0–0.5)
Eosinophils Relative: 5 %
HCT: 33.4 % — ABNORMAL LOW (ref 39.0–52.0)
Hemoglobin: 10.8 g/dL — ABNORMAL LOW (ref 13.0–17.0)
Immature Granulocytes: 0 %
Lymphocytes Relative: 31 %
Lymphs Abs: 0.8 K/uL (ref 0.7–4.0)
MCH: 28.8 pg (ref 26.0–34.0)
MCHC: 32.3 g/dL (ref 30.0–36.0)
MCV: 89.1 fL (ref 80.0–100.0)
Monocytes Absolute: 0.3 K/uL (ref 0.1–1.0)
Monocytes Relative: 11 %
Neutro Abs: 1.4 K/uL — ABNORMAL LOW (ref 1.7–7.7)
Neutrophils Relative %: 53 %
Platelets: 64 K/uL — ABNORMAL LOW (ref 150–400)
RBC: 3.75 MIL/uL — ABNORMAL LOW (ref 4.22–5.81)
RDW: 19.2 % — ABNORMAL HIGH (ref 11.5–15.5)
WBC: 2.7 K/uL — ABNORMAL LOW (ref 4.0–10.5)
nRBC: 0 % (ref 0.0–0.2)

## 2024-06-26 LAB — COMPREHENSIVE METABOLIC PANEL WITH GFR
ALT: 17 U/L (ref 0–44)
AST: 22 U/L (ref 15–41)
Albumin: 3.4 g/dL — ABNORMAL LOW (ref 3.5–5.0)
Alkaline Phosphatase: 45 U/L (ref 38–126)
Anion gap: 10 (ref 5–15)
BUN: <5 mg/dL — ABNORMAL LOW (ref 6–20)
CO2: 24 mmol/L (ref 22–32)
Calcium: 9.3 mg/dL (ref 8.9–10.3)
Chloride: 103 mmol/L (ref 98–111)
Creatinine, Ser: 0.68 mg/dL (ref 0.61–1.24)
GFR, Estimated: >60 mL/min (ref >60)
Glucose, Bld: 107 mg/dL — ABNORMAL HIGH (ref 70–99)
Potassium: 4.2 mmol/L (ref 3.5–5.1)
Sodium: 137 mmol/L (ref 135–145)
Total Bilirubin: 0.5 mg/dL (ref 0.0–1.2)
Total Protein: 6.1 g/dL — ABNORMAL LOW (ref 6.5–8.1)

## 2024-06-26 MED ORDER — SENNOSIDES-DOCUSATE SODIUM 8.6-50 MG PO TABS
1.0000 | ORAL_TABLET | Freq: Two times a day (BID) | ORAL | Status: DC
  Administered 2024-06-26 – 2024-06-28 (×3): 1 via ORAL
  Filled 2024-06-26 (×4): qty 1

## 2024-06-26 NOTE — Plan of Care (Signed)

## 2024-06-26 NOTE — Progress Notes (Signed)
 Patient educated to wear SCDs but still refuses.

## 2024-06-26 NOTE — Progress Notes (Signed)
 [facility]  PROGRESS NOTE  Gary [name] FMW:[id number] DOB: [birth year] DOA: 06/22/2024 PCP: Patient, No Pcp Per   Brief HPI:     38 y.o. male with medical history significant of HIV, subdural hematoma, alcoholic pancreatitis, alcohol  abuse with history of DTs and withdrawal seizures, portal vein thrombosis, anxiety, depression, bipolar disorder, tobacco abuse, and GERD presents with presents with abdominal pain and nausea.     Assessment/Plan:   Recurrent pancreatitis  Acute on chronic.   - Presented with abdominal pain, lipase 254 -  Review of records note CT scan of the abdomen pelvis with contrast done on 8/8 noted poorly visualized pancreatic head and neck hypodensity measuring up to 2.5 cm with slightly irregular edematous proximal pancreatic parenchyma suggestive of acute pancreatitis with developing pseudocyst not excluded.   - Continue IV fluids, pain control with IV Dilaudid  -Started on clear liquid diet; tolerating clear liquid diet - Will start full liquid diet    Alcohol  dependence with withdrawal On admission alcohol  level 285. History of delirium tremens and alcohol  withdrawal seizure in the past.  Patient reports drinking 12 pack of beer which he feels is cutting back from prior amounts that he drinks. - CIWA protocol initiated - Seizure precautions - Phenobarbital  with tapering per pharmacy - Versed  as needed for seizure activity - Transitions of care consulted   Metabolic acidosis with elevated anion gap -Resolved with IV fluids   Pancytopenia Acute.  Initial labs noted WBC 3.3, hemoglobin 10.7, and platelet count 84.  All 3 cell lines appear acutely down from baseline.  No reports of bleeding. - Likely in setting of alcohol  use disorder, HIV -Continue to monitor   HIV Viral load less than 20 on 01/28/2024, CD4 was 598 on 12/08/23.  - Biktarvy  substituted by pharmacy due to patient being on phenobarbital    Bipolar 2 disorder Depression and  anxiety Currently not current on treatment.   Pancreatic lesion Per review of records patient has prior imaging back in July with concern of small progression of a heterogeneous area in pancreatic head, likely due to persistent pancreatitis, but was noted of concern to rule out possibility of malignancy.  CA 19-9 was obtained and noted to be low at 3. -Discussed with Dr. Charlanne, [facility]; No need for MRI/MRCP. If doesnot get better by Friday, get pancreatic protocol CT. If better, can go home and rpt CT pancreas in 4-6 weeks to see which way we are heading. No indication for drainage.    Tobacco use disorder - Continue counseling about need of cessation of substance abuse - Nicotine  patch - Check UDS   GERD  - Protonix    Housing insecurity - TOC      Medications     [START ON 06/30/2024] bictegravir-emtricitabine -tenofovir  AF  1 tablet Oral Daily   dolutegravir   50 mg Oral BID   And   emtricitabine -tenofovir   1 tablet Oral Daily   folic acid   1 mg Oral Daily   lipase/protease/amylase  36,000 Units Oral TID AC   multivitamin with minerals  1 tablet Oral Daily   pantoprazole   40 mg Oral Daily   PHENObarbital   65 mg Intravenous Q8H   Followed by   [START ON 06/27/2024] PHENObarbital   32.5 mg Intravenous Q8H   sodium chloride  flush  3 mL Intravenous Q12H   thiamine   100 mg Oral Daily     Data Reviewed:   CBG:  No results for input(s): GLUCAP in the last 168 hours.  SpO2: 95 %  Vitals:   06/25/24 2353 06/26/24 0315 06/26/24 0600 06/26/24 0822  BP: 103/71 107/68 102/68 110/76  Pulse: 66 74 62 70  Resp:  18  17  Temp:  98.2 F (36.8 C)  98.6 F (37 C)  TempSrc:  Oral    SpO2:  99%  95%  Weight:      Height:          Data Reviewed:  Basic Metabolic Panel: Recent Labs  Lab 06/23/24 0022 06/23/24 1026 06/24/24 0713  NA 139  --  131*  K 3.7  --  3.7  CL 103  --  99  CO2 20*  --  23  GLUCOSE 90  --  72  BUN 6  --  <5*  CREATININE 0.57*  --  0.60*   CALCIUM  8.9  --  8.8*  MG  --  1.6* 1.7  PHOS  --  3.6  --     CBC: Recent Labs  Lab 06/23/24 0022 06/24/24 0713  WBC 3.3* 2.4*  NEUTROABS 1.0*  --   HGB 10.7* 10.4*  HCT 31.2* 31.9*  MCV 85.7 87.4  PLT 84* 55*    LFT Recent Labs  Lab 06/23/24 0022 06/24/24 0713  AST 41 27  ALT 18 17  ALKPHOS 63 51  BILITOT 0.5 1.3*  PROT 7.0 6.1*  ALBUMIN 4.3 3.5     Antibiotics: Anti-infectives (From admission, onward)    Start     Dose/Rate Route Frequency Ordered Stop   06/30/24 1000  bictegravir-emtricitabine -tenofovir  AF (BIKTARVY ) 50-200-25 MG per tablet 1 tablet        1 tablet Oral Daily 06/23/24 0747     06/23/24 1400  dolutegravir  (TIVICAY ) tablet 50 mg       Placed in And Linked Group   50 mg Oral 2 times daily 06/23/24 1312 06/29/24 2359   06/23/24 1400  emtricitabine -tenofovir  (TRUVADA ) 200-300 MG per tablet 1 tablet       Placed in And Linked Group   1 tablet Oral Daily 06/23/24 1312 06/29/24 2359        DVT prophylaxis: SCDs  Code Status: Full code  Family Communication: No family at bedside   CONSULTS    Subjective    Pain is well-controlled.  Tolerating full liquid diet  Objective    Physical Examination:  Appears in no acute distress S1-S2, regular Lungs clear to auscultation bilaterally Abdomen soft, nontender to palpation Neuro-alert, oriented x 3, intact insight and judgment  Status is: Inpatient:             [name]   [facility] If 7PM-7AM, please contact night-coverage at [email or web address], Office  [phone]   06/26/2024, 8:23 AM  LOS: 3 days

## 2024-06-26 NOTE — TOC Initial Note (Signed)
 Transition of Care [facility]) - Initial/Assessment Note    Patient Details  Name: Gary [name] MRN: [id number] Date of Birth: 06-Feb-1986  Transition of [facility]) CM/SW Contact:    Jeoffrey LITTIE Maranda ISRAEL Phone Number: 06/26/2024, 3:55 PM  Clinical Narrative:                 Pt admitted from home due to pancreatitis. Pt has history of alcohol  abuse. CSW completed SDOH consult with pt. Pt stated he lives alone, takes the bus for transportation and does not have a PCP but would like to get one. Pt also stated he drinks alcohol  but denied resources when CSW offered them. CSW messaged CMA to arrange PCP. No other current TOC needs, please consult as needs arise.         Patient Goals and CMS Choice            Expected Discharge Plan and Services                                              Prior Living Arrangements/Services                       Activities of Daily Living      Permission Sought/Granted                  Emotional Assessment              Admission diagnosis:  Acute pancreatitis [K85.90] Alcohol  withdrawal syndrome without complication (HCC) [F10.930] Acute on chronic pancreatitis (HCC) [K85.90, K86.1] Patient Active Problem List   Diagnosis Date Noted   Acute pancreatitis 06/23/2024   Depression with anxiety 05/21/2024   GERD (gastroesophageal reflux disease) 05/21/2024   Hypercalcemia 05/21/2024   Dehydration 05/19/2024   Heat exposure 05/19/2024   Epigastric pain 05/09/2024   SIRS (systemic inflammatory response syndrome) (HCC) 05/09/2024   Bipolar 2 disorder (HCC) 05/09/2024   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   Brief psychotic disorder (HCC) 04/20/2024   Hypotension 03/31/2024   High risk for readmission 03/01/2024   Alcohol  withdrawal (HCC) 02/25/2024   Severe protein-calorie malnutrition (HCC) 02/25/2024   Alcohol  induced acute pancreatitis 02/14/2024   Pancreatic lesion 02/13/2024   Portal vein  thrombosis 02/03/2024   Ileus (HCC) 12/25/2023   PTSD (post-traumatic stress disorder) 12/25/2023   Anxiety state 12/20/2023   Pancreatitis, necrotizing 12/17/2023   Tobacco use disorder 12/17/2023   Acute alcoholic pancreatitis 12/10/2023   Acute on chronic anemia 12/05/2023   Seizure (HCC) 11/19/2023   Gastritis and duodenitis 11/19/2023   Nausea and vomiting 11/01/2023   Chronic pancreatitis (HCC) 10/16/2023   Acute on chronic pancreatitis (HCC) 10/16/2023   Alcoholic fatty liver 09/25/2023   Hypomagnesemia 12/24/2022   Seizure disorder (HCC) 12/17/2022   Pancytopenia (HCC) 12/14/2022   Alcohol -induced pancreatitis 12/14/2022   Delirium tremens (HCC) 10/19/2022   Alcohol  dependence with withdrawal (HCC) 10/16/2022   Homelessness 05/28/2022   Metabolic acidosis, increased anion gap 04/16/2022   Hypokalemia 01/22/2022   Constipation 07/30/2021   Alcohol  abuse    Malingering 05/15/2020   Alcoholic intoxication without complication (HCC) 07/02/2019   Alcohol -induced mood disorder (HCC) 05/13/2019   Alcohol  use disorder, severe, dependence (HCC) 04/16/2019   Severe episode of recurrent major depressive disorder, without psychotic features (HCC) 04/16/2019   HIV (human immunodeficiency virus infection) (HCC) 06/16/2018  Polysubstance abuse (HCC) 06/16/2018   Nicotine  dependence, cigarettes, uncomplicated 06/16/2018   PCP:  Patient, No Pcp Per Pharmacy:   [facility] - [facility] [address] [place] [address] Phone: [phone] Fax: [phone]  [facility] [address] [place] [address] Phone: [phone] Fax: [phone]     Social Drivers of Health (SDOH) Social History: SDOH Screenings   Food Insecurity: Patient Declined (06/24/2024)  Housing: Patient Declined (06/23/2024)  Transportation Needs: Patient Declined (06/24/2024)  Utilities: Patient Declined (06/24/2024)  Alcohol   Screen: High Risk (04/17/2024)  Depression (PHQ2-9): Medium Risk (01/28/2024)  Financial Resource Strain: Patient Declined (10/11/2023)  Social Connections: Patient Declined (04/18/2024)  Recent Concern: Social Connections - Socially Isolated (02/03/2024)  Stress: No Stress Concern Present (06/30/2022)   Received from [facility]  Recent Concern: Stress - Stress Concern Present (06/25/2022)   Received from [facility]  Tobacco Use: High Risk (06/16/2024)   SDOH Interventions:     Readmission Risk Interventions    05/26/2024   11:12 AM 02/14/2024    3:10 PM 12/15/2023   11:23 AM  Readmission Risk Prevention Plan  Transportation Screening Complete Complete Complete  Medication Review Oceanographer) Complete Complete Complete  PCP or Specialist appointment within 3-5 days of discharge Patient refused Patient refused Complete  HRI or Home Care Consult -- Complete Complete  SW Recovery Care/Counseling Consult  Complete Complete  Palliative Care Screening Not Applicable Not Applicable Not Applicable  Skilled Nursing Facility Not Applicable Not Applicable Not Applicable

## 2024-06-27 DIAGNOSIS — D61818 Other pancytopenia: Secondary | ICD-10-CM | POA: Diagnosis not present

## 2024-06-27 DIAGNOSIS — F10930 Alcohol use, unspecified with withdrawal, uncomplicated: Secondary | ICD-10-CM | POA: Diagnosis not present

## 2024-06-27 DIAGNOSIS — E8729 Other acidosis: Secondary | ICD-10-CM | POA: Diagnosis not present

## 2024-06-27 DIAGNOSIS — K859 Acute pancreatitis without necrosis or infection, unspecified: Secondary | ICD-10-CM | POA: Diagnosis not present

## 2024-06-27 NOTE — Progress Notes (Signed)
 [facility]  PROGRESS NOTE  Gary [name] FMW:[id number] DOB: Aug 30, 1986 DOA: 06/22/2024 PCP: Patient, No Pcp Per   Brief HPI:     38 y.o. male with medical history significant of HIV, subdural hematoma, alcoholic pancreatitis, alcohol  abuse with history of DTs and withdrawal seizures, portal vein thrombosis, anxiety, depression, bipolar disorder, tobacco abuse, and GERD presents with presents with abdominal pain and nausea.     Assessment/Plan:   Recurrent pancreatitis  Acute on chronic.   - Presented with abdominal pain, lipase 254 -  Review of records note CT scan of the abdomen pelvis with contrast done on 8/8 noted poorly visualized pancreatic head and neck hypodensity measuring up to 2.5 cm with slightly irregular edematous proximal pancreatic parenchyma suggestive of acute pancreatitis with developing pseudocyst not excluded.   - Continue IV fluids, pain control with IV Dilaudid  -Started on clear liquid diet; tolerating clear liquid diet - Will start full liquid diet    Alcohol  dependence with withdrawal On admission alcohol  level 285. History of delirium tremens and alcohol  withdrawal seizure in the past.  Patient reports drinking 12 pack of beer which he feels is cutting back from prior amounts that he drinks. - CIWA protocol initiated - Seizure precautions - Phenobarbital  with tapering per pharmacy - Versed  as needed for seizure activity - Transitions of care consulted  Pancreatic lesion Per review of records patient has prior imaging back in July with concern of small progression of a heterogeneous area in pancreatic head, likely due to persistent pancreatitis, but was noted of concern to rule out possibility of malignancy.  CA 19-9 was obtained and noted to be low at 3. -Discussed with Dr. Charlanne, [facility]; No need for MRI/MRCP. If doesnot get better by Friday, get pancreatic protocol CT. If better, can go home and rpt CT pancreas in 4-6 weeks to see which way we are  heading. No indication for drainage.    Metabolic acidosis with elevated anion gap -Resolved with IV fluids   Pancytopenia Acute.  Initial labs noted WBC 3.3, hemoglobin 10.7, and platelet count 84.  All 3 cell lines appear acutely down from baseline.  No reports of bleeding. - Likely in setting of alcohol  use disorder, HIV -Continue to monitor   HIV Viral load less than 20 on 01/28/2024, CD4 was 598 on 12/08/23.  - Biktarvy  substituted by pharmacy due to patient being on phenobarbital    Bipolar 2 disorder Depression and anxiety Currently not current on treatment.   Tobacco use disorder - Continue counseling about need of cessation of substance abuse - Nicotine  patch - Check UDS   GERD  - Protonix    Housing insecurity - TOC      Medications     [START ON 06/30/2024] bictegravir-emtricitabine -tenofovir  AF  1 tablet Oral Daily   dolutegravir   50 mg Oral BID   And   emtricitabine -tenofovir   1 tablet Oral Daily   folic acid   1 mg Oral Daily   lipase/protease/amylase  36,000 Units Oral TID AC   multivitamin with minerals  1 tablet Oral Daily   pantoprazole   40 mg Oral Daily   PHENObarbital   32.5 mg Intravenous Q8H   senna-docusate  1 tablet Oral BID   sodium chloride  flush  3 mL Intravenous Q12H   thiamine   100 mg Oral Daily     Data Reviewed:   CBG:  No results for input(s): GLUCAP in the last 168 hours.  SpO2: 100 %    Vitals:   06/26/24 1200 06/26/24 1651  06/26/24 2105 06/27/24 0318  BP: 110/76 (!) 145/65 (!) 90/57 91/61  Pulse: 73 90 (!) 58 (!) 55  Resp:  18 16 18   Temp:  98.1 F (36.7 C) 98.2 F (36.8 C) 98 F (36.7 C)  TempSrc:  Oral Oral Oral  SpO2:  100% 98% 100%  Weight:      Height:          Data Reviewed:  Basic Metabolic Panel: Recent Labs  Lab 06/23/24 0022 06/23/24 1026 06/24/24 0713 06/26/24 1626  NA 139  --  131* 137  K 3.7  --  3.7 4.2  CL 103  --  99 103  CO2 20*  --  23 24  GLUCOSE 90  --  72 107*  BUN 6  --  <5*  <5*  CREATININE 0.57*  --  0.60* 0.68  CALCIUM  8.9  --  8.8* 9.3  MG  --  1.6* 1.7  --   PHOS  --  3.6  --   --     CBC: Recent Labs  Lab 06/23/24 0022 06/24/24 0713 06/26/24 1626  WBC 3.3* 2.4* 2.7*  NEUTROABS 1.0*  --  1.4*  HGB 10.7* 10.4* 10.8*  HCT 31.2* 31.9* 33.4*  MCV 85.7 87.4 89.1  PLT 84* 55* 64*    LFT Recent Labs  Lab 06/23/24 0022 06/24/24 0713 06/26/24 1626  AST 41 27 22  ALT 18 17 17   ALKPHOS 63 51 45  BILITOT 0.5 1.3* 0.5  PROT 7.0 6.1* 6.1*  ALBUMIN 4.3 3.5 3.4*     Antibiotics: Anti-infectives (From admission, onward)    Start     Dose/Rate Route Frequency Ordered Stop   06/30/24 1000  bictegravir-emtricitabine -tenofovir  AF (BIKTARVY ) 50-200-25 MG per tablet 1 tablet        1 tablet Oral Daily 06/23/24 0747     06/23/24 1400  dolutegravir  (TIVICAY ) tablet 50 mg       Placed in And Linked Group   50 mg Oral 2 times daily 06/23/24 1312 06/29/24 2359   06/23/24 1400  emtricitabine -tenofovir  (TRUVADA ) 200-300 MG per tablet 1 tablet       Placed in And Linked Group   1 tablet Oral Daily 06/23/24 1312 06/29/24 2359        DVT prophylaxis: SCDs  Code Status: Full code  Family Communication: No family at bedside   CONSULTS    Subjective   Complains of intermittent epigastric pain   Objective    Physical Examination:  Appears in no acute distress S1-S2, regular, murmur auscultated Abdomen soft, nontender, no organomegaly Extremities no edema  Status is: Inpatient:             Gary [name]   [facility] If 7PM-7AM, please contact night-coverage at [email or web address], Office  [phone]   06/27/2024, 8:12 AM  LOS: 4 days

## 2024-06-27 NOTE — Plan of Care (Signed)

## 2024-06-27 NOTE — Plan of Care (Signed)
  Problem: Clinical Measurements: Goal: Diagnostic test results will improve Outcome: Progressing   Problem: Activity: Goal: Risk for activity intolerance will decrease Outcome: Progressing   Problem: Nutrition: Goal: Adequate nutrition will be maintained Outcome: Progressing   Problem: Coping: Goal: Level of anxiety will decrease Outcome: Progressing   Problem: Health Behavior/Discharge Planning: Goal: Ability to manage health-related needs will improve Outcome: Not Progressing   Problem: Pain Managment: Goal: General experience of comfort will improve and/or be controlled Outcome: Not Progressing

## 2024-06-28 ENCOUNTER — Other Ambulatory Visit (HOSPITAL_COMMUNITY): Payer: Self-pay

## 2024-06-28 DIAGNOSIS — K861 Other chronic pancreatitis: Secondary | ICD-10-CM | POA: Diagnosis not present

## 2024-06-28 DIAGNOSIS — K859 Acute pancreatitis without necrosis or infection, unspecified: Secondary | ICD-10-CM | POA: Diagnosis not present

## 2024-06-28 MED ORDER — THIAMINE HCL 100 MG PO TABS
100.0000 mg | ORAL_TABLET | Freq: Every day | ORAL | 0 refills | Status: DC
  Filled 2024-06-28: qty 90, 90d supply, fill #0

## 2024-06-28 MED ORDER — PHENOBARBITAL 32.4 MG PO TABS: 32.4000 mg | ORAL_TABLET | Freq: Once | ORAL | Status: DC

## 2024-06-28 MED ORDER — OXYCODONE HCL 5 MG PO TABS: 10.0000 mg | ORAL_TABLET | ORAL | Status: DC | PRN

## 2024-06-28 MED ORDER — OXYCODONE-ACETAMINOPHEN 10-325 MG PO TABS
1.0000 | ORAL_TABLET | Freq: Three times a day (TID) | ORAL | 0 refills | Status: DC | PRN
  Filled 2024-06-28: qty 15, 5d supply, fill #0

## 2024-06-28 MED ORDER — OXYCODONE HCL 5 MG PO TABS: 5.0000 mg | ORAL_TABLET | ORAL | Status: DC | PRN

## 2024-06-28 MED ORDER — GABAPENTIN 100 MG PO CAPS
100.0000 mg | ORAL_CAPSULE | Freq: Every day | ORAL | 0 refills | Status: DC
  Filled 2024-06-28: qty 15, 15d supply, fill #0

## 2024-06-28 MED ORDER — LEVETIRACETAM 500 MG PO TABS
500.0000 mg | ORAL_TABLET | Freq: Two times a day (BID) | ORAL | 0 refills | Status: DC
  Filled 2024-06-28: qty 60, 30d supply, fill #0

## 2024-06-28 MED ORDER — PANTOPRAZOLE SODIUM 40 MG PO TBEC
40.0000 mg | DELAYED_RELEASE_TABLET | Freq: Every day | ORAL | 0 refills | Status: DC
  Filled 2024-06-28: qty 30, 30d supply, fill #0

## 2024-06-28 NOTE — Plan of Care (Signed)

## 2024-06-28 NOTE — Care Plan (Signed)
 AVS printed out and instructed to pt. Verbalized understanding. Pt has bus pass and able to pick up his meds. DC lounge staff called. PIV removed.

## 2024-06-28 NOTE — Plan of Care (Signed)
   Problem: Education: Goal: Knowledge of General Education information will improve Description Including pain rating scale, medication(s)/side effects and non-pharmacologic comfort measures Outcome: Progressing

## 2024-06-30 NOTE — Discharge Summary (Signed)
 Physician Discharge Summary   Patient: Gary [name] MRN: [id number] DOB: 08/04/1986  Admit date:     06/22/2024  Discharge date: 06/28/2024  Discharge Physician: Yetta Blanch  PCP: Patient, No Pcp Per  Recommendations at discharge:  Follow up with pcp in 1 week   Follow-up Information     Gary Rosaline SHAUNNA, NP Follow up.   Specialty: Internal Medicine Why: TIME : 1:30 PM    PLEASE ARRIVE AT 1:00 PM DATE : OCT 90,7974   PLEASE BRING ALL MEDICATION , ID and INS CARD Contact information: [id number] [place] [address] [phone]                Discharge Diagnoses: Active Problems:   Acute on chronic pancreatitis (HCC)   Alcohol  dependence with withdrawal (HCC)   Metabolic acidosis, increased anion gap   Pancytopenia (HCC)   HIV (human immunodeficiency virus infection) (HCC)   Bipolar 2 disorder (HCC)   Depression with anxiety   Pancreatic lesion   Tobacco use disorder   GERD (gastroesophageal reflux disease)   Hospital Course: 38 y.o. male with medical history significant of HIV, subdural hematoma, alcoholic pancreatitis, alcohol  abuse with history of DTs and withdrawal seizures, portal vein thrombosis, anxiety, depression, bipolar disorder, tobacco abuse, and GERD presents with presents with abdominal pain and nausea.   Recurrent pancreatitis  Acute on chronic.   Presented with abdominal pain, lipase 254  Review of records note CT scan of the abdomen pelvis with contrast done on 8/8 noted poorly visualized pancreatic head and neck hypodensity measuring up to 2.5 cm with slightly irregular edematous proximal pancreatic parenchyma suggestive of acute pancreatitis with developing pseudocyst not excluded.   Treated with IV fluids, pain control with IV Dilaudid  Tolerating oral diet.  Asked to stop alcohol  intake. Pt has no plans.     Alcohol  dependence with withdrawal On admission alcohol  level 285. History of delirium tremens and alcohol  withdrawal  seizure in the past.  Patient reports drinking 12 pack of beer which he feels is cutting back from prior amounts that he drinks. CIWA protocol initiated Seizure precautions Phenobarbital  with tapering per pharmacy Resources given and will give short course of gabapentin .   Pancreatic lesion Per review of records patient has prior imaging back in July with concern of small progression of a heterogeneous area in pancreatic head, likely due to persistent pancreatitis, but was noted of concern to rule out possibility of malignancy.  CA 19-9 was obtained and noted to be low at 3. -Discussed with Dr. Charlanne, [facility]; No need for MRI/MRCP. can go home and rpt CT pancreas in 4-6 weeks to see which way we are heading. No indication for drainage.  Referral placed to Gastroenterology    Metabolic acidosis with elevated anion gap -Resolved with IV fluids   Pancytopenia Acute.  Initial labs noted WBC 3.3, hemoglobin 10.7, and platelet count 84.  All 3 cell lines appear acutely down from baseline.  No reports of bleeding. - Likely in setting of alcohol  use disorder, HIV   HIV Viral load less than 20 on 01/28/2024, CD4 was 598 on 12/08/23.  - Biktarvy  substituted by pharmacy due to patient being on phenobarbital  Non complaint   Bipolar 2 disorder Depression and anxiety Currently not current on treatment.   Tobacco use disorder - Continue counseling about need of cessation of substance abuse - Nicotine  patch   GERD  - Protonix    Pain control - [place]  Controlled Substance Reporting System database was reviewed.  and patient was instructed, not to drive, operate heavy machinery, perform activities at heights, swimming or participation in water  activities or provide baby-sitting services while on Pain, Sleep and Anxiety Medications; until their outpatient Physician has advised to do so again. Also recommended to not to take more than prescribed Pain, Sleep and Anxiety Medications.  Consultants:   nonoe  Procedures performed:  none  DISCHARGE MEDICATION: Allergies as of 06/28/2024       Reactions   Risperidone  And Paliperidone Other (See Comments)   Patient states causes opposite effect (he will not take again)   Tegretol  [carbamazepine ] Other (See Comments)   Vertigo  Paralysis   Caffeine Palpitations        Medication List     STOP taking these medications    feeding supplement Liqd   QUEtiapine  50 MG tablet Commonly known as: SEROQUEL    thiamine  100 MG tablet Commonly known as: VITAMIN B1 Replaced by: thiamine  100 MG tablet       TAKE these medications    Biktarvy  50-200-25 MG Tabs tablet Generic drug: bictegravir-emtricitabine -tenofovir  AF Take 1 tablet by mouth daily.   Creon  36000-114000 units Cpep capsule Generic drug: lipase/protease/amylase Take 1 capsule (36,000 Units total) by mouth 3 (three) times daily before meals.   folic acid  1 MG tablet Commonly known as: FOLVITE  Take 1 tablet (1 mg total) by mouth daily.   gabapentin  100 MG capsule Commonly known as: NEURONTIN  Take 1 capsule (100 mg total) by mouth at bedtime. What changed:  how much to take when to take this   levETIRAcetam  500 MG tablet Commonly known as: KEPPRA  Take 1 tablet (500 mg total) by mouth 2 (two) times daily.   nicotine  14 mg/24hr patch Commonly known as: NICODERM CQ  - dosed in mg/24 hours Place 1 patch (14 mg total) onto the skin daily.   oxyCODONE -acetaminophen  10-325 MG tablet Commonly known as: Percocet Take 1 tablet by mouth every 8 (eight) hours as needed for pain.   pantoprazole  40 MG tablet Commonly known as: PROTONIX  Take 1 tablet (40 mg total) by mouth daily.   thiamine  100 MG tablet Commonly known as: VITAMIN B1 Take 1 tablet (100 mg total) by mouth daily. Replaces: thiamine  100 MG tablet       Disposition: Home Diet recommendation: Cardiac diet  Discharge Exam: Vitals:   06/27/24 1719 06/27/24 1939 06/28/24 0251 06/28/24 0757  BP:  91/68 (!) 88/53 93/69 (!) 93/54  Pulse: 65 66 71 66  Resp:  18 20   Temp:  98.4 F (36.9 C) 98.4 F (36.9 C) 98 F (36.7 C)  TempSrc:  Oral Oral Oral  SpO2:  100% 99% 100%  Weight:      Height:       Bowel sounds present  Non tender  No edema   Filed Weights   06/23/24 0423  Weight: 68.4 kg   Condition at discharge: stable  The results of significant diagnostics from this hospitalization (including imaging, microbiology, ancillary and laboratory) are listed below for reference.   Imaging Studies: CT ABDOMEN PELVIS W CONTRAST Result Date: 06/16/2024 CLINICAL DATA:  Pancreatitis, acute, severe.  Abdominal pain EXAM: CT ABDOMEN AND PELVIS WITH CONTRAST TECHNIQUE: Multidetector CT imaging of the abdomen and pelvis was performed using the standard protocol following bolus administration of intravenous contrast. RADIATION DOSE REDUCTION: This exam was performed according to the departmental dose-optimization program which includes automated exposure control, adjustment of the mA and/or kV according to patient size and/or use of iterative reconstruction technique.  CONTRAST:  OMNIPAQUE  IOHEXOL  300 MG/ML  SOLN COMPARISON:  CT abdomen pelvis 05/22/2024, CT abdomen pelvis 05/08/2024, MR abdomen 03/01/2024 FINDINGS: Lower chest: No acute abnormality. Hepatobiliary: The hepatic parenchyma is diffusely hypodense compared to the splenic parenchyma consistent with fatty infiltration. No focal liver abnormality. No gallstones, gallbladder wall thickening, or pericholecystic fluid. No biliary dilatation. Pancreas: Poorly visualized pancreatic head and neck hypodensity measuring up to 2.5 cm (8:72) with slightly irregular edematous proximal pancreatic parenchyma. No main pancreatic ductal dilatation. Spleen: Normal in size without focal abnormality. Adrenals/Urinary Tract: No adrenal nodule bilaterally. Bilateral kidneys enhance symmetrically. No hydronephrosis. No hydroureter. Mild smooth anterior  urinary bladder wall thickening (10:85) likely due to under distension. Otherwise unremarkable urinary bladder. Stomach/Bowel: Stomach is within normal limits. No evidence of bowel wall thickening or dilatation. Colonic diverticulosis. Appendix appears normal. Vascular/Lymphatic: No abdominal aorta or iliac aneurysm. No abdominal, pelvic, or inguinal lymphadenopathy. Reproductive: Prostate is unremarkable. Other: No intraperitoneal free fluid. No intraperitoneal free gas. No organized fluid collection. Musculoskeletal: No abdominal wall hernia or abnormality. No suspicious lytic or blastic osseous lesions. No acute displaced fracture. IMPRESSION: 1. Poorly visualized pancreatic head and neck hypodensity measuring up to 2.5 cm with slightly irregular edematous proximal pancreatic parenchyma. Findings suggestive of acute pancreatitis with developing pseudocyst not excluded. 2. Mild anterior urinary bladder wall thickening likely due to under distension anteriorly. Consider correlation with urinalysis. Recommend attention on follow-up. 3. Colonic diverticulosis with no acute diverticulitis. 4. Prostatomegaly. Electronically Signed   By: Morgane  Naveau M.D.   On: 06/16/2024 23:32    Microbiology: Results for orders placed or performed during the hospital encounter of 03/01/24  Blood Culture (routine x 2)     Status: None   Collection Time: 03/01/24  1:11 AM   Specimen: BLOOD  Result Value Ref Range Status   Specimen Description BLOOD LA  Final   Special Requests   Final    BOTTLES DRAWN AEROBIC AND ANAEROBIC Blood Culture results may not be optimal due to an inadequate volume of blood received in culture bottles   Culture   Final    NO GROWTH 5 DAYS Performed at [facility], [address] [place], [place] [address]    Report Status 03/06/2024 FINAL  Final  Blood Culture (routine x 2)     Status: None   Collection Time: 03/01/24  2:08 AM   Specimen: BLOOD  Result Value Ref Range Status    Specimen Description BLOOD LA  Final   Special Requests   Final    BOTTLES DRAWN AEROBIC AND ANAEROBIC Blood Culture results may not be optimal due to an inadequate volume of blood received in culture bottles   Culture   Final    NO GROWTH 5 DAYS Performed at [facility], [address] [place], [place] [address]    Report Status 03/06/2024 FINAL  Final  Resp panel by RT-PCR (RSV, Flu A&B, Covid) Anterior Nasal Swab     Status: None   Collection Time: 03/01/24  2:19 AM   Specimen: Anterior Nasal Swab  Result Value Ref Range Status   SARS Coronavirus 2 by RT PCR NEGATIVE NEGATIVE Final    Comment: (NOTE) SARS-CoV-2 target nucleic acids are NOT DETECTED.  The SARS-CoV-2 RNA is generally detectable in upper respiratory specimens during the acute phase of infection. The lowest concentration of SARS-CoV-2 viral copies this assay can detect is 138 copies/mL. A negative result does not preclude SARS-Cov-2 infection and should not be used as the  sole basis for treatment or other patient management decisions. A negative result may occur with  improper specimen collection/handling, submission of specimen other than nasopharyngeal swab, presence of viral mutation(s) within the areas targeted by this assay, and inadequate number of viral copies(<138 copies/mL). A negative result must be combined with clinical observations, patient history, and epidemiological information. The expected result is Negative.  Fact Sheet for Patients:  BloggerCourse.com  Fact Sheet for Healthcare Providers:  SeriousBroker.it  This test is no t yet approved or cleared by the [place]  FDA and  has been authorized for detection and/or diagnosis of SARS-CoV-2 by FDA under an Emergency Use Authorization (EUA). This EUA will remain  in effect (meaning this test can be used) for the duration of the COVID-19 declaration under Section 564(b)(1) of the  Act, 21 U.S.C.section 360bbb-3(b)(1), unless the authorization is terminated  or revoked sooner.       Influenza A by PCR NEGATIVE NEGATIVE Final   Influenza B by PCR NEGATIVE NEGATIVE Final    Comment: (NOTE) The Xpert Xpress SARS-CoV-2/FLU/RSV plus assay is intended as an aid in the diagnosis of influenza from Nasopharyngeal swab specimens and should not be used as a sole basis for treatment. Nasal washings and aspirates are unacceptable for Xpert Xpress SARS-CoV-2/FLU/RSV testing.  Fact Sheet for Patients: BloggerCourse.com  Fact Sheet for Healthcare Providers: SeriousBroker.it  This test is not yet approved or cleared by the [place]  FDA and has been authorized for detection and/or diagnosis of SARS-CoV-2 by FDA under an Emergency Use Authorization (EUA). This EUA will remain in effect (meaning this test can be used) for the duration of the COVID-19 declaration under Section 564(b)(1) of the Act, 21 U.S.C. section 360bbb-3(b)(1), unless the authorization is terminated or revoked.     Resp Syncytial Virus by PCR NEGATIVE NEGATIVE Final    Comment: (NOTE) Fact Sheet for Patients: BloggerCourse.com  Fact Sheet for Healthcare Providers: SeriousBroker.it  This test is not yet approved or cleared by the [place]  FDA and has been authorized for detection and/or diagnosis of SARS-CoV-2 by FDA under an Emergency Use Authorization (EUA). This EUA will remain in effect (meaning this test can be used) for the duration of the COVID-19 declaration under Section 564(b)(1) of the Act, 21 U.S.C. section 360bbb-3(b)(1), unless the authorization is terminated or revoked.  Performed at [facility], [address] [place], [place] [address]   C Difficile Quick Screen w PCR reflex     Status: None   Collection Time: 03/01/24  8:12 AM   Specimen: STOOL  Result Value  Ref Range Status   C Diff antigen NEGATIVE NEGATIVE Final   C Diff toxin NEGATIVE NEGATIVE Final   C Diff interpretation No C. difficile detected.  Final    Comment: Performed at [facility], [address] [place], [place] [address]  Gastrointestinal Panel by PCR , Stool     Status: None   Collection Time: 03/01/24  8:12 AM   Specimen: STOOL  Result Value Ref Range Status   Campylobacter species NOT DETECTED NOT DETECTED Final   Plesimonas shigelloides NOT DETECTED NOT DETECTED Final   Salmonella species NOT DETECTED NOT DETECTED Final   Yersinia enterocolitica NOT DETECTED NOT DETECTED Final   Vibrio species NOT DETECTED NOT DETECTED Final   Vibrio cholerae NOT DETECTED NOT DETECTED Final   Enteroaggregative E coli (EAEC) NOT DETECTED NOT DETECTED Final   Enteropathogenic E coli (EPEC) NOT DETECTED NOT DETECTED Final   Enterotoxigenic E coli (  ETEC) NOT DETECTED NOT DETECTED Final   Shiga like toxin producing E coli (STEC) NOT DETECTED NOT DETECTED Final   Shigella/Enteroinvasive E coli (EIEC) NOT DETECTED NOT DETECTED Final   Cryptosporidium NOT DETECTED NOT DETECTED Final   Cyclospora cayetanensis NOT DETECTED NOT DETECTED Final   Entamoeba histolytica NOT DETECTED NOT DETECTED Final   Giardia lamblia NOT DETECTED NOT DETECTED Final   Adenovirus F40/41 NOT DETECTED NOT DETECTED Final   Astrovirus NOT DETECTED NOT DETECTED Final   Norovirus GI/GII NOT DETECTED NOT DETECTED Final   Rotavirus A NOT DETECTED NOT DETECTED Final   Sapovirus (I, II, IV, and V) NOT DETECTED NOT DETECTED Final    Comment: Performed at [facility], [address] [place], [place] [address]  MRSA Next Gen by PCR, Nasal     Status: None   Collection Time: 03/02/24  5:32 AM   Specimen: Nasal Mucosa; Nasal Swab  Result Value Ref Range Status   MRSA by PCR Next Gen NOT DETECTED NOT DETECTED Final    Comment: (NOTE) The GeneXpert MRSA Assay (FDA approved for NASAL specimens only), is  one component of a comprehensive MRSA colonization surveillance program. It is not intended to diagnose MRSA infection nor to guide or monitor treatment for MRSA infections. Test performance is not FDA approved in patients less than 6 years old. Performed at [facility], [address] [place], [place] [address]    Labs: CBC: Recent Labs  Lab 06/24/24 0713 06/26/24 1626  WBC 2.4* 2.7*  NEUTROABS  --  1.4*  HGB 10.4* 10.8*  HCT 31.9* 33.4*  MCV 87.4 89.1  PLT 55* 64*   Basic Metabolic Panel: Recent Labs  Lab 06/23/24 1026 06/24/24 0713 06/26/24 1626  NA  --  131* 137  K  --  3.7 4.2  CL  --  99 103  CO2  --  23 24  GLUCOSE  --  72 107*  BUN  --  <5* <5*  CREATININE  --  0.60* 0.68  CALCIUM   --  8.8* 9.3  MG 1.6* 1.7  --   PHOS 3.6  --   --    Liver Function Tests: Recent Labs  Lab 06/24/24 0713 06/26/24 1626  AST 27 22  ALT 17 17  ALKPHOS 51 45  BILITOT 1.3* 0.5  PROT 6.1* 6.1*  ALBUMIN 3.5 3.4*   CBG: No results for input(s): GLUCAP in the last 168 hours.  Discharge time spent: greater than 30 minutes.  Author: Yetta Blanch, MD  [facility] 06/28/2024

## 2024-07-06 ENCOUNTER — Emergency Department (HOSPITAL_COMMUNITY)
Admission: EM | Admit: 2024-07-06 | Discharge: 2024-07-07 | Disposition: A | Discharge status: Home / Self Care | Payer: MEDICAID | Attending: Emergency Medicine | Admitting: Emergency Medicine

## 2024-07-06 ENCOUNTER — Other Ambulatory Visit: Payer: Self-pay

## 2024-07-06 DIAGNOSIS — Z21 Asymptomatic human immunodeficiency virus [HIV] infection status: Secondary | ICD-10-CM | POA: Insufficient documentation

## 2024-07-06 DIAGNOSIS — K860 Alcohol-induced chronic pancreatitis: Secondary | ICD-10-CM | POA: Insufficient documentation

## 2024-07-06 DIAGNOSIS — K2920 Alcoholic gastritis without bleeding: Secondary | ICD-10-CM | POA: Insufficient documentation

## 2024-07-06 DIAGNOSIS — F1010 Alcohol abuse, uncomplicated: Secondary | ICD-10-CM

## 2024-07-06 DIAGNOSIS — Y908 Blood alcohol level of 240 mg/100 ml or more: Secondary | ICD-10-CM | POA: Insufficient documentation

## 2024-07-06 DIAGNOSIS — R1010 Upper abdominal pain, unspecified: Secondary | ICD-10-CM | POA: Diagnosis present

## 2024-07-06 LAB — COMPREHENSIVE METABOLIC PANEL WITH GFR
ALT: 25 U/L (ref 0–44)
AST: 39 U/L (ref 15–41)
Albumin: 3.9 g/dL (ref 3.5–5.0)
Alkaline Phosphatase: 44 U/L (ref 38–126)
Anion gap: 15 (ref 5–15)
BUN: <5 mg/dL — ABNORMAL LOW (ref 6–20)
CO2: 22 mmol/L (ref 22–32)
Calcium: 8.3 mg/dL — ABNORMAL LOW (ref 8.9–10.3)
Chloride: 103 mmol/L (ref 98–111)
Creatinine, Ser: 0.63 mg/dL (ref 0.61–1.24)
GFR, Estimated: >60 mL/min (ref >60)
Glucose, Bld: 101 mg/dL — ABNORMAL HIGH (ref 70–99)
Potassium: 3.8 mmol/L (ref 3.5–5.1)
Sodium: 140 mmol/L (ref 135–145)
Total Bilirubin: 0.5 mg/dL (ref 0.0–1.2)
Total Protein: 7.2 g/dL (ref 6.5–8.1)

## 2024-07-06 LAB — ETHANOL: Alcohol, Ethyl (B): 402 mg/dL (ref <15)

## 2024-07-06 LAB — CBC
HCT: 34.8 % — ABNORMAL LOW (ref 39.0–52.0)
Hemoglobin: 11.5 g/dL — ABNORMAL LOW (ref 13.0–17.0)
MCH: 28.8 pg (ref 26.0–34.0)
MCHC: 33 g/dL (ref 30.0–36.0)
MCV: 87 fL (ref 80.0–100.0)
Platelets: 341 K/uL (ref 150–400)
RBC: 4 MIL/uL — ABNORMAL LOW (ref 4.22–5.81)
RDW: 19.9 % — ABNORMAL HIGH (ref 11.5–15.5)
WBC: 4.4 K/uL (ref 4.0–10.5)
nRBC: 0 % (ref 0.0–0.2)

## 2024-07-06 LAB — LIPASE, BLOOD: Lipase: 40 U/L (ref 11–51)

## 2024-07-06 NOTE — ED Provider Triage Note (Signed)
 Emergency Medicine Provider Triage Evaluation Note  Gary [name] , a 38 y.o. male  was evaluated in triage.  Pt complains of epigastric abdominal pain which is been ongoing for 2 days.  Patient has a history of alcohol  induced pancreatitis.  He states he is transition from vodka to under a 12 pack of beer daily.  He has been trying to discontinue his alcohol  usage.  His last drink was approximately 3 to 4 hours ago.  Patient Dors is nausea without vomiting.  He denies chest pain, shortness of breath..  Review of Systems  Positive:  Negative:   Physical Exam  BP 107/81   Pulse 91   Temp 98.8 F (37.1 C)   Resp 18   Ht 6' (1.829 m)   Wt 68.4 kg   SpO2 99%   BMI 20.45 kg/m  Gen:   Awake, no distress   Resp:  Normal effort  MSK:   Moves extremities without difficulty  Other:  Epigastric tenderness  Medical Decision Making  Medically screening exam initiated at 10:31 PM.  Appropriate orders placed.  ZYIER DYKEMA was informed that the remainder of the evaluation will be completed by another provider, this initial triage assessment does not replace that evaluation, and the importance of remaining in the ED until their evaluation is complete.     Logan Ubaldo NOVAK, [place] 07/06/24 2232

## 2024-07-06 NOTE — ED Triage Notes (Signed)
 Abd pain that radiates all over. Hx of pancreatitis. 10/10. +nausea. No vomiting. Has been going on for 2 days.

## 2024-07-06 NOTE — ED Notes (Signed)
 Pt reports unable to provide a urine sample right now.

## 2024-07-07 ENCOUNTER — Other Ambulatory Visit (HOSPITAL_COMMUNITY): Payer: Self-pay

## 2024-07-07 MED ORDER — SUCRALFATE 1 G PO TABS
1.0000 g | ORAL_TABLET | Freq: Three times a day (TID) | ORAL | 0 refills | Status: DC
  Filled 2024-07-07: qty 90, 23d supply, fill #0

## 2024-07-07 MED ORDER — ALUM & MAG HYDROXIDE-SIMETH 200-200-20 MG/5ML PO SUSP
30.0000 mL | Freq: Once | ORAL | Status: AC
  Administered 2024-07-07: 30 mL via ORAL
  Filled 2024-07-07: qty 30

## 2024-07-07 MED ORDER — SUCRALFATE 1 G PO TABS
1.0000 g | ORAL_TABLET | Freq: Once | ORAL | Status: AC
  Administered 2024-07-07: 1 g via ORAL
  Filled 2024-07-07: qty 1

## 2024-07-07 NOTE — ED Provider Notes (Signed)
 [place] EMERGENCY DEPARTMENT AT [facility] Provider Note   CSN: [id number] Arrival date & time: 07/06/24  2156     Patient presents with: Abdominal Pain   Gary [name] is a 38 y.o. male.   HPI     This is a 38 year old male well-known to our emergency department who presents with abdominal pain and concerns for alcohol  withdrawal.  Patient can barely provide me history.  States that he is here for alcohol  withdrawal.  However his alcohol  level is 402.  He states that he continues to have upper abdominal pain.  No nausea or vomiting.  No change in bowel movements.  Prior to Admission medications   Medication Sig Start Date End Date Taking? Authorizing Provider  sucralfate  (CARAFATE ) 1 g tablet Take 1 tablet (1 g total) by mouth 4 (four) times daily -  with meals and at bedtime. 07/07/24  Yes Kida Digiulio, Charmaine FALCON, MD  bictegravir-emtricitabine -tenofovir  AF (BIKTARVY ) 50-200-25 MG TABS tablet Take 1 tablet by mouth daily. 02/24/24   Von Bellis, MD  folic acid  (FOLVITE ) 1 MG tablet Take 1 tablet (1 mg total) by mouth daily. 05/27/24   Amin, Sumayya, MD  gabapentin  (NEURONTIN ) 100 MG capsule Take 1 capsule (100 mg total) by mouth at bedtime. 06/28/24   Tobie Yetta HERO, MD  levETIRAcetam  (KEPPRA ) 500 MG tablet Take 1 tablet (500 mg total) by mouth 2 (two) times daily. 06/28/24   Tobie Yetta HERO, MD  lipase/protease/amylase (CREON ) 36000 UNITS CPEP capsule Take 1 capsule (36,000 Units total) by mouth 3 (three) times daily before meals. 05/26/24   Amin, Sumayya, MD  nicotine  (NICODERM CQ  - DOSED IN MG/24 HOURS) 14 mg/24hr patch Place 1 patch (14 mg total) onto the skin daily. 04/26/24   Blair Robin H, NP  oxyCODONE -acetaminophen  (PERCOCET) 10-325 MG tablet Take 1 tablet by mouth every 8 (eight) hours as needed for pain. 06/28/24 06/28/25  Tobie Yetta HERO, MD  pantoprazole  (PROTONIX ) 40 MG tablet Take 1 tablet (40 mg total) by mouth daily. 06/29/24   Tobie Yetta HERO, MD  thiamine   (VITAMIN B1) 100 MG tablet Take 1 tablet (100 mg total) by mouth daily. 06/28/24   Tobie Yetta HERO, MD  famotidine  (PEPCID ) 20 MG tablet Take 1 tablet (20 mg total) by mouth 2 (two) times daily. Patient not taking: Reported on 06/01/2019 05/14/19 06/02/19  [name], MD    Allergies: Risperidone  and paliperidone, Tegretol  [carbamazepine ], and Caffeine    Review of Systems  Constitutional:  Negative for fever.  Gastrointestinal:  Positive for abdominal pain.  All other systems reviewed and are negative.   Updated Vital Signs BP 103/68 (BP Location: Right Arm)   Pulse 91   Temp 98.1 F (36.7 C)   Resp 18   Ht 1.829 m (6')   Wt 68.4 kg   SpO2 99%   BMI 20.45 kg/m   Physical Exam Vitals and nursing note reviewed.  Constitutional:      Comments: Chronically ill-appearing, disheveled  HENT:     Head: Normocephalic and atraumatic.  Eyes:     Pupils: Pupils are equal, round, and reactive to light.  Cardiovascular:     Rate and Rhythm: Normal rate and regular rhythm.     Heart sounds: Normal heart sounds. No murmur heard. Pulmonary:     Effort: Pulmonary effort is normal. No respiratory distress.     Breath sounds: Normal breath sounds. No wheezing.  Abdominal:     General: Bowel sounds are normal.  Palpations: Abdomen is soft.     Tenderness: There is abdominal tenderness in the epigastric area. There is no guarding or rebound.  Musculoskeletal:     Cervical back: Neck supple.  Lymphadenopathy:     Cervical: No cervical adenopathy.  Skin:    General: Skin is warm and dry.  Neurological:     Comments: appears intoxicated     (all labs ordered are listed, but only abnormal results are displayed) Labs Reviewed  COMPREHENSIVE METABOLIC PANEL WITH GFR - Abnormal; Notable for the following components:      Result Value   Glucose, Bld 101 (*)    BUN <5 (*)    Calcium  8.3 (*)    All other components within normal limits  CBC - Abnormal; Notable for the following  components:   RBC 4.00 (*)    Hemoglobin 11.5 (*)    HCT 34.8 (*)    RDW 19.9 (*)    All other components within normal limits  ETHANOL - Abnormal; Notable for the following components:   Alcohol , Ethyl (B) 402 (*)    All other components within normal limits  LIPASE, BLOOD  URINALYSIS, ROUTINE W REFLEX MICROSCOPIC    EKG: None  Radiology: No results found.   Procedures   Medications Ordered in the ED  alum & mag hydroxide-simeth (MAALOX/MYLANTA) 200-200-20 MG/5ML suspension 30 mL (30 mLs Oral Given 07/07/24 0253)  sucralfate  (CARAFATE ) tablet 1 g (1 g Oral Given 07/07/24 0253)                                    Medical Decision Making Amount and/or Complexity of Data Reviewed Labs: ordered.  Risk OTC drugs. Prescription drug management.   This patient presents to the ED for concern of abdominal pain, alcohol  withdrawal, this involves an extensive number of treatment options, and is a complaint that carries with it a high risk of complications and morbidity.  I considered the following differential and admission for this acute, potentially life threatening condition.  The differential diagnosis includes chronic gastritis, pancreatitis peptic ulcer, intoxication  MDM:    This is a 38 year old male who presents with concern for alcohol  withdrawal and abdominal pain.  His blood alcohol  level is 42 which would argue against acute withdrawal.  Likely acute intoxication given he appears intoxicated on exam.  He has some epigastric tenderness to palpation.  Lipase is normal.  Labs are at his baseline.  He does have a history of chronic pancreatitis but also likely has some gastritis from his ongoing alcohol  use.  Given Carafate  and GI cocktail.  He was allowed to sleep for several hours given that he appeared intoxicated.  Ultimately was discharged after metabolizing.  Patient was advised that if he does not find a way to quit drinking he will continue to have complications  including gastritis and pancreatitis.  (Labs, imaging, consults)  Labs: I Ordered, and personally interpreted labs.  The pertinent results include: CBC, CMP, lipase  Imaging Studies ordered: I ordered imaging studies including none I independently visualized and interpreted imaging. I agree with the radiologist interpretation  Additional history obtained from chart review.  External records from outside source obtained and reviewed including prior evaluations  Cardiac Monitoring: The patient was not maintained on a cardiac monitor.  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    Dispense:  90 tablet    Refill:  0    I have reviewed the patients home medicines and have made adjustments as needed  Problem List / ED Course: Problem List Items Addressed This Visit       Digestive   Chronic pancreatitis (HCC)   Relevant Medications   sucralfate  (CARAFATE ) 1 g tablet     Other   Alcohol  abuse - Primary   Other Visit  Diagnoses       Acute alcoholic gastritis without hemorrhage                    Final diagnoses:  Alcohol  abuse  Acute alcoholic gastritis without hemorrhage  Alcohol -induced chronic pancreatitis [facility])    ED Discharge Orders          Ordered    sucralfate  (CARAFATE ) 1 g tablet  3 times daily with meals & bedtime        07/07/24 0450               Bari Charmaine FALCON, MD 07/07/24 [phone]

## 2024-07-07 NOTE — Discharge Instructions (Addendum)
 You were seen today for ongoing abdominal pain.  This is likely related to your ongoing alcohol  use.  Take your medications as prescribed.

## 2024-07-12 ENCOUNTER — Other Ambulatory Visit: Payer: Self-pay

## 2024-07-12 ENCOUNTER — Encounter (HOSPITAL_COMMUNITY): Payer: Self-pay

## 2024-07-12 ENCOUNTER — Emergency Department (HOSPITAL_COMMUNITY)
Admission: EM | Admit: 2024-07-12 | Discharge: 2024-07-13 | Disposition: A | Discharge status: Home / Self Care | Payer: MEDICAID | Attending: Emergency Medicine | Admitting: Emergency Medicine

## 2024-07-12 DIAGNOSIS — F1020 Alcohol dependence, uncomplicated: Secondary | ICD-10-CM | POA: Insufficient documentation

## 2024-07-12 DIAGNOSIS — F1090 Alcohol use, unspecified, uncomplicated: Secondary | ICD-10-CM | POA: Diagnosis not present

## 2024-07-12 DIAGNOSIS — K2920 Alcoholic gastritis without bleeding: Secondary | ICD-10-CM | POA: Diagnosis not present

## 2024-07-12 DIAGNOSIS — Y909 Presence of alcohol in blood, level not specified: Secondary | ICD-10-CM | POA: Diagnosis not present

## 2024-07-12 DIAGNOSIS — K860 Alcohol-induced chronic pancreatitis: Secondary | ICD-10-CM | POA: Insufficient documentation

## 2024-07-12 DIAGNOSIS — R1013 Epigastric pain: Secondary | ICD-10-CM | POA: Diagnosis present

## 2024-07-12 LAB — CBC
HCT: 33.5 % — ABNORMAL LOW (ref 39.0–52.0)
Hemoglobin: 11.1 g/dL — ABNORMAL LOW (ref 13.0–17.0)
MCH: 28.9 pg (ref 26.0–34.0)
MCHC: 33.1 g/dL (ref 30.0–36.0)
MCV: 87.2 fL (ref 80.0–100.0)
Platelets: 142 K/uL — ABNORMAL LOW (ref 150–400)
RBC: 3.84 MIL/uL — ABNORMAL LOW (ref 4.22–5.81)
RDW: 20.7 % — ABNORMAL HIGH (ref 11.5–15.5)
WBC: 3.7 K/uL — ABNORMAL LOW (ref 4.0–10.5)
nRBC: 0 % (ref 0.0–0.2)

## 2024-07-12 NOTE — ED Triage Notes (Addendum)
 Pt was picked up at [facility] c/o abdominal pain and chest pain. Hx of pancreatitis. Pt everyday drinker and last drink was before dark. Pt thinks he is going through withdrawal.

## 2024-07-13 ENCOUNTER — Encounter (HOSPITAL_COMMUNITY): Payer: Self-pay

## 2024-07-13 ENCOUNTER — Emergency Department (HOSPITAL_COMMUNITY)
Admission: EM | Admit: 2024-07-13 | Discharge: 2024-07-13 | Disposition: A | Discharge status: Home / Self Care | Payer: MEDICAID | Source: Home / Self Care | Attending: Emergency Medicine | Admitting: Emergency Medicine

## 2024-07-13 ENCOUNTER — Other Ambulatory Visit: Payer: Self-pay

## 2024-07-13 DIAGNOSIS — Y909 Presence of alcohol in blood, level not specified: Secondary | ICD-10-CM | POA: Insufficient documentation

## 2024-07-13 DIAGNOSIS — F1090 Alcohol use, unspecified, uncomplicated: Secondary | ICD-10-CM | POA: Insufficient documentation

## 2024-07-13 LAB — COMPREHENSIVE METABOLIC PANEL WITH GFR
ALT: 21 U/L (ref 0–44)
AST: 34 U/L (ref 15–41)
Albumin: 3.5 g/dL (ref 3.5–5.0)
Alkaline Phosphatase: 47 U/L (ref 38–126)
Anion gap: 12 (ref 5–15)
BUN: 9 mg/dL (ref 6–20)
CO2: 24 mmol/L (ref 22–32)
Calcium: 8.3 mg/dL — ABNORMAL LOW (ref 8.9–10.3)
Chloride: 100 mmol/L (ref 98–111)
Creatinine, Ser: 0.56 mg/dL — ABNORMAL LOW (ref 0.61–1.24)
GFR, Estimated: >60 mL/min (ref >60)
Glucose, Bld: 116 mg/dL — ABNORMAL HIGH (ref 70–99)
Potassium: 3.2 mmol/L — ABNORMAL LOW (ref 3.5–5.1)
Sodium: 136 mmol/L (ref 135–145)
Total Bilirubin: 0.7 mg/dL (ref 0.0–1.2)
Total Protein: 6.3 g/dL — ABNORMAL LOW (ref 6.5–8.1)

## 2024-07-13 LAB — LIPASE, BLOOD: Lipase: 51 U/L (ref 11–51)

## 2024-07-13 LAB — TROPONIN I (HIGH SENSITIVITY): Troponin I (High Sensitivity): 7 ng/L (ref <18)

## 2024-07-13 MED ORDER — SUCRALFATE 1 G PO TABS: 1.0000 g | ORAL_TABLET | Freq: Three times a day (TID) | ORAL | 0 refills | Status: DC

## 2024-07-13 MED ORDER — SUCRALFATE 1 GM/10ML PO SUSP
1.0000 g | Freq: Once | ORAL | Status: AC
  Administered 2024-07-13: 1 g via ORAL
  Filled 2024-07-13: qty 10

## 2024-07-13 MED ORDER — CHLORDIAZEPOXIDE HCL 25 MG PO CAPS
25.0000 mg | ORAL_CAPSULE | Freq: Once | ORAL | Status: AC
  Administered 2024-07-13: 25 mg via ORAL
  Filled 2024-07-13: qty 1

## 2024-07-13 MED ORDER — ALUM & MAG HYDROXIDE-SIMETH 200-200-20 MG/5ML PO SUSP
30.0000 mL | Freq: Once | ORAL | Status: AC
  Administered 2024-07-13: 30 mL via ORAL
  Filled 2024-07-13: qty 30

## 2024-07-13 MED ORDER — SUCRALFATE 1 GM/10ML PO SUSP
1.0000 g | Freq: Once | ORAL | Status: AC
  Administered 2024-07-13: 1 g via ORAL
  Filled 2024-07-13 (×2): qty 10

## 2024-07-13 MED ORDER — CHLORDIAZEPOXIDE HCL 25 MG PO CAPS: ORAL_CAPSULE | ORAL | 0 refills | Status: DC

## 2024-07-13 MED ORDER — PANTOPRAZOLE SODIUM 20 MG PO TBEC: 20.0000 mg | DELAYED_RELEASE_TABLET | Freq: Every day | ORAL | 0 refills | Status: DC

## 2024-07-13 NOTE — ED Notes (Signed)
 Patient Alert and oriented to baseline. Stable and ambulatory to baseline. Patient verbalized understanding of the discharge instructions.  Patient belongings were taken by the patient.

## 2024-07-13 NOTE — ED Provider Notes (Signed)
 [place] EMERGENCY DEPARTMENT AT [facility]  Provider Note  CSN: [id number] Arrival date & time: 07/12/24 2236  History Chief Complaint  Patient presents with   Abdominal Pain   Alcohol  Problem    Gary [name] is a 38 y.o. male with history of alcohol  use disorder and chronic pancreatitis/gastritis brought to the ED via EMS for epigastric pain, similar to numerous prior ED visits in the region including at [facility] on 8/28. He is unable to give many additional details other than his abdomen hurts but not as bad as previously. He continues to drink heavily.    Home Medications Prior to Admission medications   Medication Sig Start Date End Date Taking? Authorizing Provider  bictegravir-emtricitabine -tenofovir  AF (BIKTARVY ) 50-200-25 MG TABS tablet Take 1 tablet by mouth daily. 02/24/24   Von Bellis, MD  folic acid  (FOLVITE ) 1 MG tablet Take 1 tablet (1 mg total) by mouth daily. 05/27/24   Amin, Sumayya, MD  gabapentin  (NEURONTIN ) 100 MG capsule Take 1 capsule (100 mg total) by mouth at bedtime. 06/28/24   Tobie Yetta HERO, MD  levETIRAcetam  (KEPPRA ) 500 MG tablet Take 1 tablet (500 mg total) by mouth 2 (two) times daily. 06/28/24   Tobie Yetta HERO, MD  lipase/protease/amylase (CREON ) 36000 UNITS CPEP capsule Take 1 capsule (36,000 Units total) by mouth 3 (three) times daily before meals. 05/26/24   Amin, Sumayya, MD  nicotine  (NICODERM CQ  - DOSED IN MG/24 HOURS) 14 mg/24hr patch Place 1 patch (14 mg total) onto the skin daily. 04/26/24   Bennett, Christal H, NP  oxyCODONE -acetaminophen  (PERCOCET) 10-325 MG tablet Take 1 tablet by mouth every 8 (eight) hours as needed for pain. 06/28/24 06/28/25  Tobie Yetta HERO, MD  pantoprazole  (PROTONIX ) 40 MG tablet Take 1 tablet (40 mg total) by mouth daily. 06/29/24   Tobie Yetta HERO, MD  sucralfate  (CARAFATE ) 1 g tablet Take 1 tablet (1 g total) by mouth 4 (four) times daily -  with meals and at bedtime. 07/07/24   Horton, Charmaine FALCON, MD  thiamine   (VITAMIN B1) 100 MG tablet Take 1 tablet (100 mg total) by mouth daily. 06/28/24   Tobie Yetta HERO, MD  famotidine  (PEPCID ) 20 MG tablet Take 1 tablet (20 mg total) by mouth 2 (two) times daily. Patient not taking: Reported on 06/01/2019 05/14/19 06/02/19  [name], MD     Allergies    Risperidone  and paliperidone, Tegretol  [carbamazepine ], and Caffeine   Review of Systems   Review of Systems Please see HPI for pertinent positives and negatives  Physical Exam BP 110/76   Pulse 83   Temp 98.2 F (36.8 C) (Oral)   Resp 19   SpO2 94%   Physical Exam Vitals and nursing note reviewed.  Constitutional:      Appearance: Normal appearance.  HENT:     Head: Normocephalic and atraumatic.     Nose: Nose normal.     Mouth/Throat:     Mouth: Mucous membranes are moist.  Eyes:     Extraocular Movements: Extraocular movements intact.     Conjunctiva/sclera: Conjunctivae normal.  Cardiovascular:     Rate and Rhythm: Normal rate.  Pulmonary:     Effort: Pulmonary effort is normal.     Breath sounds: Normal breath sounds.  Abdominal:     General: Abdomen is flat.     Palpations: Abdomen is soft.     Tenderness: There is abdominal tenderness in the epigastric area. There is no guarding. Negative signs include Murphy's  sign and McBurney's sign.  Musculoskeletal:        General: No swelling. Normal range of motion.     Cervical back: Neck supple.  Skin:    General: Skin is warm and dry.  Neurological:     General: No focal deficit present.     Mental Status: He is alert.  Psychiatric:        Mood and Affect: Mood normal.     ED Results / Procedures / Treatments   EKG EKG Interpretation Date/Time:  Wednesday July 12 2024 22:44:48 EDT Ventricular Rate:  88 PR Interval:  157 QRS Duration:  107 QT Interval:  383 QTC Calculation: 464 R Axis:   68  Text Interpretation: Sinus rhythm Atrial premature complex Probable anteroseptal infarct, old No significant change since  last tracing Confirmed by Roselyn Dunnings [id number]) on 07/13/2024 12:31:54 AM  Procedures Procedures  Medications Ordered in the ED Medications  alum & mag hydroxide-simeth (MAALOX/MYLANTA) 200-200-20 MG/5ML suspension 30 mL (has no administration in time range)  sucralfate  (CARAFATE ) 1 GM/10ML suspension 1 g (has no administration in time range)    Initial Impression and Plan  Patient here with exacerbation of chronic abdominal pain and ongoing EtOH use. Labs done in triage show CBC with pancytopenia similar to previous. CMP and lipase are unremarkable. Trop is neg. Discussed with patient no concern for acute EtOH withdrawal or pancreatitis. No indication for admission or advanced imaging. Will give GI cocktail and carafate . Recommend he seek treatment for his alcohol  use disorder. RTED for any other concerns.   ED Course       MDM Rules/Calculators/A&P Medical Decision Making Problems Addressed: Alcohol  use disorder: chronic illness or injury Alcohol -induced chronic pancreatitis (HCC): chronic illness or injury Chronic alcoholic gastritis without hemorrhage: chronic illness or injury  Amount and/or Complexity of Data Reviewed Labs: ordered. Decision-making details documented in ED Course.  Risk OTC drugs. Prescription drug management.     Final Clinical Impression(s) / ED Diagnoses Final diagnoses:  Alcohol  use disorder  Alcohol -induced chronic pancreatitis (HCC)  Chronic alcoholic gastritis without hemorrhage    Rx / DC Orders ED Discharge Orders     None        Roselyn Dunnings NOVAK, MD 07/13/24 [phone]

## 2024-07-13 NOTE — ED Provider Notes (Signed)
 [place] EMERGENCY DEPARTMENT AT [facility] Provider Note   CSN: [id number] Arrival date & time: 07/13/24  1037     Patient presents with: Delirium Tremens (DTS)   Gary [name] is a 38 y.o. male.   HPI Patient with alcohol  use disorder presents for the second time in 12 hours with concern for epigastric pain, concern for possible withdrawal.  Patient notes that after being seen earlier, with concern for possible seizures he drank 3 small boxes of wine.  He now presents with persistent pain in the epigastric area. We discussed his ongoing alcohol  use, options for therapy, and the pain.     Prior to Admission medications   Medication Sig Start Date End Date Taking? Authorizing Provider  chlordiazePOXIDE  (LIBRIUM ) 25 MG capsule 50mg  PO TID x 1D, then 25-50mg  PO BID X 1D, then 25-50mg  PO QD X 1D 07/13/24  Yes Garrick Charleston, MD  levETIRAcetam  (KEPPRA ) 500 MG tablet Take 1 tablet (500 mg total) by mouth 2 (two) times daily. 06/28/24  Yes Tobie Yetta HERO, MD  pantoprazole  (PROTONIX ) 20 MG tablet Take 1 tablet (20 mg total) by mouth daily for 14 days. 07/13/24 07/27/24 Yes Garrick Charleston, MD  sucralfate  (CARAFATE ) 1 g tablet Take 1 tablet (1 g total) by mouth 4 (four) times daily -  with meals and at bedtime. 07/13/24  Yes Garrick Charleston, MD  bictegravir-emtricitabine -tenofovir  AF (BIKTARVY ) 50-200-25 MG TABS tablet Take 1 tablet by mouth daily. 02/24/24   Von Bellis, MD  folic acid  (FOLVITE ) 1 MG tablet Take 1 tablet (1 mg total) by mouth daily. 05/27/24   Amin, Sumayya, MD  gabapentin  (NEURONTIN ) 100 MG capsule Take 1 capsule (100 mg total) by mouth at bedtime. 06/28/24   Tobie Yetta HERO, MD  lipase/protease/amylase (CREON ) 36000 UNITS CPEP capsule Take 1 capsule (36,000 Units total) by mouth 3 (three) times daily before meals. 05/26/24   Amin, Sumayya, MD  nicotine  (NICODERM CQ  - DOSED IN MG/24 HOURS) 14 mg/24hr patch Place 1 patch (14 mg total) onto the skin daily. 04/26/24    Blair, Christal H, NP  oxyCODONE -acetaminophen  (PERCOCET) 10-325 MG tablet Take 1 tablet by mouth every 8 (eight) hours as needed for pain. 06/28/24 06/28/25  Tobie Yetta HERO, MD  pantoprazole  (PROTONIX ) 40 MG tablet Take 1 tablet (40 mg total) by mouth daily. 06/29/24   Tobie Yetta HERO, MD  sucralfate  (CARAFATE ) 1 g tablet Take 1 tablet (1 g total) by mouth 4 (four) times daily -  with meals and at bedtime. 07/07/24   Horton, Charmaine FALCON, MD  thiamine  (VITAMIN B1) 100 MG tablet Take 1 tablet (100 mg total) by mouth daily. 06/28/24   Tobie Yetta HERO, MD  famotidine  (PEPCID ) 20 MG tablet Take 1 tablet (20 mg total) by mouth 2 (two) times daily. Patient not taking: Reported on 06/01/2019 05/14/19 06/02/19  [name], MD    Allergies: Risperidone  and paliperidone, Tegretol  [carbamazepine ], and Caffeine    Review of Systems  Updated Vital Signs BP 119/77 (BP Location: Right Arm)   Pulse 97   Temp 99.1 F (37.3 C) (Oral)   Resp 18   Ht 1.829 m (6')   Wt 68.4 kg   SpO2 97%   BMI 20.45 kg/m   Physical Exam Vitals and nursing note reviewed.  Constitutional:      General: He is not in acute distress.    Appearance: He is well-developed.  HENT:     Head: Normocephalic and atraumatic.  Eyes:  Conjunctiva/sclera: Conjunctivae normal.  Cardiovascular:     Rate and Rhythm: Normal rate and regular rhythm.  Pulmonary:     Effort: Pulmonary effort is normal. No respiratory distress.     Breath sounds: No stridor.  Abdominal:     General: There is no distension.  Skin:    General: Skin is warm and dry.  Neurological:     Mental Status: He is alert and oriented to person, place, and time.     (all labs ordered are listed, but only abnormal results are displayed) Labs Reviewed - No data to display  EKG: None  Radiology: No results found.   Procedures   Medications Ordered in the ED  sucralfate  (CARAFATE ) 1 GM/10ML suspension 1 g (has no administration in time range)   chlordiazePOXIDE  (LIBRIUM ) capsule 25 mg (has no administration in time range)                                    Medical Decision Making Patient with alcohol  use disorder, now presents with ongoing epigastric pain in the context of recent ED eval, 28 prior anemia eval's over the past 6 months, and having had alcohol  earlier today. Labs from within the past 12 hours reviewed, consistent with multiple prior studies, pancytopenia, normal lipase, and with reassuring vital signs, no seizures, patient has no evidence of withdrawal currently. We discussed options for treatment, patient will start Librium .  With stable vital signs, patient can continue Librium  as an outpatient, was provided referrals for outpatient therapy.  Amount and/or Complexity of Data Reviewed External Data Reviewed: notes.    Details: As above Labs:  Decision-making details documented in ED Course.    Details: As above  Risk Prescription drug management. Decision regarding hospitalization. Diagnosis or treatment significantly limited by social determinants of health.    Final diagnoses:  Alcohol  use disorder    ED Discharge Orders          Ordered    chlordiazePOXIDE  (LIBRIUM ) 25 MG capsule        07/13/24 1138    sucralfate  (CARAFATE ) 1 g tablet  3 times daily with meals & bedtime       Note to Pharmacy: Take for one week   07/13/24 1138    pantoprazole  (PROTONIX ) 20 MG tablet  Daily        07/13/24 1138               Garrick Charleston, MD 07/13/24 1139

## 2024-07-13 NOTE — ED Triage Notes (Signed)
 Pt arrived via P[facility] c/o recurrent ETOH abuse, withdrawals, tremors, and reports hallucinations. Pt reports having a few alcoholic drinks after being discharged from [facility] last night.

## 2024-07-13 NOTE — Discharge Instructions (Addendum)
 Your labs from earlier today are reassuring, vital signs are reassuring.  Your pain is likely due to gastritis from your ongoing alcohol  use.  Please take your medications as prescribed and use the provided resources to engage in alcohol  cessation therapy.

## 2024-07-13 NOTE — ED Triage Notes (Signed)
 Pt reports concern for developing seizures and endorses LUQ abdominal pain from pancreatitis.

## 2024-07-14 ENCOUNTER — Emergency Department (HOSPITAL_COMMUNITY)
Admission: EM | Admit: 2024-07-14 | Discharge: 2024-07-14 | Disposition: A | Discharge status: Home / Self Care | Payer: MEDICAID | Attending: Emergency Medicine | Admitting: Emergency Medicine

## 2024-07-14 DIAGNOSIS — Z5900 Homelessness unspecified: Secondary | ICD-10-CM | POA: Insufficient documentation

## 2024-07-14 DIAGNOSIS — F10129 Alcohol abuse with intoxication, unspecified: Secondary | ICD-10-CM | POA: Insufficient documentation

## 2024-07-14 DIAGNOSIS — F10920 Alcohol use, unspecified with intoxication, uncomplicated: Secondary | ICD-10-CM

## 2024-07-14 DIAGNOSIS — Z21 Asymptomatic human immunodeficiency virus [HIV] infection status: Secondary | ICD-10-CM | POA: Insufficient documentation

## 2024-07-14 DIAGNOSIS — Y909 Presence of alcohol in blood, level not specified: Secondary | ICD-10-CM | POA: Insufficient documentation

## 2024-07-14 MED ORDER — THIAMINE MONONITRATE 100 MG PO TABS
200.0000 mg | ORAL_TABLET | Freq: Every day | ORAL | Status: DC
  Administered 2024-07-14: 200 mg via ORAL
  Filled 2024-07-14: qty 2

## 2024-07-14 NOTE — ED Notes (Addendum)
 Pt with eyes closed and chest rising and falling at this time.

## 2024-07-14 NOTE — ED Notes (Signed)
Pt provided food and drink.

## 2024-07-14 NOTE — ED Triage Notes (Signed)
 Chief Complaint  Patient presents with   Alcohol  Intoxication    [facility] from the side of the road heavily intoxicated. Pt is alert and oriented. No complaints or distress.

## 2024-07-14 NOTE — ED Provider Notes (Signed)
 [place] EMERGENCY DEPARTMENT AT [facility] Provider Note   CSN: [id number] Arrival date & time: 07/14/24  9243     Patient presents with: Alcohol  Intoxication ([facility] from the side of the road heavily intoxicated. Pt is alert and oriented. No complaints or distress.)   Gary [name] is a 38 y.o. male.   HPI     38 year old male comes in with chief complaint of alcohol  intoxication. Patient has history of alcohol  use disorder, polysubstance use disorder, HIV.  He is also currently unhoused.  Patient has had complications such as pancreatitis and gastritis because of his alcohol  use.  He comes into the ER, after bystanders saw him passed out on the street.  Per EMS, when they arrived patient was combative.  Patient has no complaints or concerns at this time, he just wants to be left alone.  Prior to Admission medications   Medication Sig Start Date End Date Taking? Authorizing Provider  bictegravir-emtricitabine -tenofovir  AF (BIKTARVY ) 50-200-25 MG TABS tablet Take 1 tablet by mouth daily. 02/24/24   Von Bellis, MD  chlordiazePOXIDE  (LIBRIUM ) 25 MG capsule 50mg  PO TID x 1D, then 25-50mg  PO BID X 1D, then 25-50mg  PO QD X 1D 07/13/24   Garrick Charleston, MD  folic acid  (FOLVITE ) 1 MG tablet Take 1 tablet (1 mg total) by mouth daily. 05/27/24   Amin, Sumayya, MD  gabapentin  (NEURONTIN ) 100 MG capsule Take 1 capsule (100 mg total) by mouth at bedtime. 06/28/24   Tobie Yetta HERO, MD  levETIRAcetam  (KEPPRA ) 500 MG tablet Take 1 tablet (500 mg total) by mouth 2 (two) times daily. 06/28/24   Tobie Yetta HERO, MD  lipase/protease/amylase (CREON ) 36000 UNITS CPEP capsule Take 1 capsule (36,000 Units total) by mouth 3 (three) times daily before meals. 05/26/24   Amin, Sumayya, MD  nicotine  (NICODERM CQ  - DOSED IN MG/24 HOURS) 14 mg/24hr patch Place 1 patch (14 mg total) onto the skin daily. 04/26/24   Bennett, Christal H, NP  oxyCODONE -acetaminophen  (PERCOCET) 10-325 MG tablet Take 1  tablet by mouth every 8 (eight) hours as needed for pain. 06/28/24 06/28/25  Tobie Yetta HERO, MD  pantoprazole  (PROTONIX ) 20 MG tablet Take 1 tablet (20 mg total) by mouth daily for 14 days. 07/13/24 07/27/24  Garrick Charleston, MD  pantoprazole  (PROTONIX ) 40 MG tablet Take 1 tablet (40 mg total) by mouth daily. 06/29/24   Tobie Yetta HERO, MD  sucralfate  (CARAFATE ) 1 g tablet Take 1 tablet (1 g total) by mouth 4 (four) times daily -  with meals and at bedtime. 07/07/24   Horton, Charmaine FALCON, MD  sucralfate  (CARAFATE ) 1 g tablet Take 1 tablet (1 g total) by mouth 4 (four) times daily -  with meals and at bedtime. 07/13/24   Garrick Charleston, MD  thiamine  (VITAMIN B1) 100 MG tablet Take 1 tablet (100 mg total) by mouth daily. 06/28/24   Tobie Yetta HERO, MD  famotidine  (PEPCID ) 20 MG tablet Take 1 tablet (20 mg total) by mouth 2 (two) times daily. Patient not taking: Reported on 06/01/2019 05/14/19 06/02/19  [name], MD    Allergies: Risperidone  and paliperidone, Tegretol  [carbamazepine ], and Caffeine    Review of Systems  All other systems reviewed and are negative.   Updated Vital Signs BP 104/76 (BP Location: Right Arm)   Pulse 80   Temp (!) 97.4 F (36.3 C) (Oral)   Resp 16   SpO2 98%   Physical Exam Vitals and nursing note reviewed.  Constitutional:  Appearance: He is well-developed.  HENT:     Head: Atraumatic.  Eyes:     Extraocular Movements: Extraocular movements intact.     Pupils: Pupils are equal, round, and reactive to light.  Cardiovascular:     Rate and Rhythm: Normal rate.  Pulmonary:     Effort: Pulmonary effort is normal.  Abdominal:     Tenderness: There is no abdominal tenderness.  Musculoskeletal:     Cervical back: Neck supple.  Skin:    General: Skin is warm.  Neurological:     Mental Status: He is alert and oriented to person, place, and time.     (all labs ordered are listed, but only abnormal results are displayed) Labs Reviewed - No data to  display  EKG: None  Radiology: No results found.   Procedures   Medications Ordered in the ED  thiamine  (VITAMIN B1) tablet 200 mg (200 mg Oral Given 07/14/24 1202)    Clinical Course as of 07/14/24 1331  Fri Jul 14, 2024  1126 Patient still asleep.  I advised nursing staff to give patient meal at noon time.  On reassessment, he still sleeping.  He is arousable to sternal rub, answers questions but falls asleep. [AN]  1331 Pt eating food. He is answering questions appropriately, nursing staff to d/c his once he ambulates. [AN]    Clinical Course User Index [AN] Charlyn Sora, MD                                 Medical Decision Making Risk OTC drugs.   38 year old male comes in with chief complaint of alcohol  intoxication.  He has history of alcohol  use disorder, complications such as pancreatitis, gastritis and is currently unhoused.  It appears that bystanders called EMS when he was found laying on the street.  Patient just wants to be left alone.  He was combative with EMS.  Currently calm.  He has no complaints pertaining to trauma and I do not see any deformities.  Patient has been seen in the ER multiple times.  I have reviewed previous records including labs from yesterday.  No indication for repeat labs at this time.  From trauma perspective, there is no signs of any deformity and patient is AO x 3, denies any headache.  I feel comfortable not ordering any brain scan at this time and just to monitor the patient closely.  We will start oral hydration. Social determinant of health: Unhoused, has no PCP.    Final diagnoses:  Alcoholic intoxication without complication [facility])    ED Discharge Orders     None          Charlyn Sora, MD 07/14/24 1331

## 2024-07-15 ENCOUNTER — Encounter (HOSPITAL_COMMUNITY): Payer: Self-pay

## 2024-07-15 ENCOUNTER — Emergency Department (HOSPITAL_COMMUNITY)
Admission: EM | Admit: 2024-07-15 | Discharge: 2024-07-16 | Disposition: A | Discharge status: Home / Self Care | Payer: MEDICAID | Attending: Emergency Medicine | Admitting: Emergency Medicine

## 2024-07-15 DIAGNOSIS — F10120 Alcohol abuse with intoxication, uncomplicated: Secondary | ICD-10-CM | POA: Diagnosis present

## 2024-07-15 DIAGNOSIS — F10129 Alcohol abuse with intoxication, unspecified: Secondary | ICD-10-CM | POA: Diagnosis present

## 2024-07-15 DIAGNOSIS — F10920 Alcohol use, unspecified with intoxication, uncomplicated: Secondary | ICD-10-CM

## 2024-07-15 DIAGNOSIS — Y908 Blood alcohol level of 240 mg/100 ml or more: Secondary | ICD-10-CM | POA: Insufficient documentation

## 2024-07-15 DIAGNOSIS — Z21 Asymptomatic human immunodeficiency virus [HIV] infection status: Secondary | ICD-10-CM | POA: Diagnosis not present

## 2024-07-15 DIAGNOSIS — Z720 Tobacco use: Secondary | ICD-10-CM | POA: Diagnosis not present

## 2024-07-15 DIAGNOSIS — F10121 Alcohol abuse with intoxication delirium: Secondary | ICD-10-CM | POA: Diagnosis not present

## 2024-07-15 DIAGNOSIS — R7309 Other abnormal glucose: Secondary | ICD-10-CM | POA: Diagnosis not present

## 2024-07-15 LAB — COMPREHENSIVE METABOLIC PANEL WITH GFR
ALT: 44 U/L (ref 0–44)
AST: 62 U/L — ABNORMAL HIGH (ref 15–41)
Albumin: 4.3 g/dL (ref 3.5–5.0)
Alkaline Phosphatase: 59 U/L (ref 38–126)
Anion gap: 13 (ref 5–15)
BUN: 8 mg/dL (ref 6–20)
CO2: 23 mmol/L (ref 22–32)
Calcium: 8.5 mg/dL — ABNORMAL LOW (ref 8.9–10.3)
Chloride: 103 mmol/L (ref 98–111)
Creatinine, Ser: 0.68 mg/dL (ref 0.61–1.24)
GFR, Estimated: >60 mL/min (ref >60)
Glucose, Bld: 131 mg/dL — ABNORMAL HIGH (ref 70–99)
Potassium: 4 mmol/L (ref 3.5–5.1)
Sodium: 140 mmol/L (ref 135–145)
Total Bilirubin: 0.3 mg/dL (ref 0.0–1.2)
Total Protein: 6.9 g/dL (ref 6.5–8.1)

## 2024-07-15 LAB — CBC
HCT: 34.7 % — ABNORMAL LOW (ref 39.0–52.0)
Hemoglobin: 11.4 g/dL — ABNORMAL LOW (ref 13.0–17.0)
MCH: 28.9 pg (ref 26.0–34.0)
MCHC: 32.9 g/dL (ref 30.0–36.0)
MCV: 87.8 fL (ref 80.0–100.0)
Platelets: 113 K/uL — ABNORMAL LOW (ref 150–400)
RBC: 3.95 MIL/uL — ABNORMAL LOW (ref 4.22–5.81)
RDW: 21.5 % — ABNORMAL HIGH (ref 11.5–15.5)
WBC: 4.6 K/uL (ref 4.0–10.5)
nRBC: 0 % (ref 0.0–0.2)

## 2024-07-15 LAB — ETHANOL: Alcohol, Ethyl (B): 449 mg/dL (ref <15)

## 2024-07-15 MED ORDER — HALOPERIDOL LACTATE 5 MG/ML IJ SOLN
2.0000 mg | Freq: Once | INTRAMUSCULAR | Status: AC
  Administered 2024-07-16: 2 mg via INTRAMUSCULAR
  Filled 2024-07-15: qty 1

## 2024-07-15 NOTE — ED Triage Notes (Addendum)
 Pt states that he is going through ETOH withdraws, last drink before he came. Denies SI, reports HI against anyone who gets in his way

## 2024-07-16 ENCOUNTER — Emergency Department (HOSPITAL_COMMUNITY)
Admission: EM | Admit: 2024-07-16 | Discharge: 2024-07-17 | Disposition: A | Discharge status: Home / Self Care | Payer: MEDICAID | Attending: Emergency Medicine | Admitting: Emergency Medicine

## 2024-07-16 ENCOUNTER — Encounter (HOSPITAL_COMMUNITY): Payer: Self-pay

## 2024-07-16 ENCOUNTER — Emergency Department (HOSPITAL_COMMUNITY)
Admission: EM | Admit: 2024-07-16 | Discharge: 2024-07-16 | Disposition: A | Discharge status: Home / Self Care | Payer: MEDICAID | Source: Home / Self Care | Attending: Emergency Medicine | Admitting: Emergency Medicine

## 2024-07-16 ENCOUNTER — Other Ambulatory Visit (HOSPITAL_COMMUNITY): Payer: Self-pay

## 2024-07-16 ENCOUNTER — Other Ambulatory Visit: Payer: Self-pay

## 2024-07-16 DIAGNOSIS — Z21 Asymptomatic human immunodeficiency virus [HIV] infection status: Secondary | ICD-10-CM | POA: Insufficient documentation

## 2024-07-16 DIAGNOSIS — Z720 Tobacco use: Secondary | ICD-10-CM | POA: Insufficient documentation

## 2024-07-16 DIAGNOSIS — F10921 Alcohol use, unspecified with intoxication delirium: Secondary | ICD-10-CM

## 2024-07-16 DIAGNOSIS — R7309 Other abnormal glucose: Secondary | ICD-10-CM | POA: Insufficient documentation

## 2024-07-16 DIAGNOSIS — F10920 Alcohol use, unspecified with intoxication, uncomplicated: Secondary | ICD-10-CM

## 2024-07-16 DIAGNOSIS — F10120 Alcohol abuse with intoxication, uncomplicated: Secondary | ICD-10-CM | POA: Insufficient documentation

## 2024-07-16 DIAGNOSIS — F10121 Alcohol abuse with intoxication delirium: Secondary | ICD-10-CM | POA: Insufficient documentation

## 2024-07-16 DIAGNOSIS — Y909 Presence of alcohol in blood, level not specified: Secondary | ICD-10-CM | POA: Insufficient documentation

## 2024-07-16 LAB — CBG MONITORING, ED: Glucose-Capillary: 178 mg/dL — ABNORMAL HIGH (ref 70–99)

## 2024-07-16 MED ORDER — ZIPRASIDONE MESYLATE 20 MG IM SOLR
10.0000 mg | Freq: Once | INTRAMUSCULAR | Status: AC
  Administered 2024-07-16: 10 mg via INTRAMUSCULAR
  Filled 2024-07-16: qty 20

## 2024-07-16 NOTE — Discharge Instructions (Addendum)
 Return to the ER for any new or worsening symptoms.

## 2024-07-16 NOTE — ED Triage Notes (Signed)
 Patient brought in by [facility] from [employer]. Patient is homeless. Dispatched for ETOH/hyperglycemia. No hx of diabetes. No falls.   CBG 226. 152/100, 99% O2, 20 Resp, 99HR.

## 2024-07-16 NOTE — ED Provider Notes (Signed)
 Care transferred to me.  He was allowed to metabolize after the Geodon .  He is easily awakened and has been eating.  He feels well at this time.  Does not appear to be clinically intoxicated or agitated.  Appears stable for discharge with return precautions.   Gary Hamilton, MD 07/16/24 [phone]

## 2024-07-16 NOTE — ED Provider Notes (Signed)
 [name] EMERGENCY DEPARTMENT AT [facility] Provider Note   CSN: [id number] Arrival date & time: 07/16/24  1150     Patient presents with: Alcohol  Intoxication   Gary [name] is a 38 y.o. male.    he has been having the symptoms for about 2 weeks.   Alcohol  Intoxication    StrokeBrought in by police.  Reportedly has been at [employer] threatening people.  Multiple visits to the ER for alcohol  intoxication.  Has reportedly been drinking.  Patient really will not provide much history.  Last seen yesterday in [place] an alcohol  level of 450 at that time.  Patient has handcuffs on but he requested police put them on.  Somewhat agitated at this time.     Prior to Admission medications   Medication Sig Start Date End Date Taking? Authorizing Provider  bictegravir-emtricitabine -tenofovir  AF (BIKTARVY ) 50-200-25 MG TABS tablet Take 1 tablet by mouth daily. 02/24/24   Von Bellis, MD  chlordiazePOXIDE  (LIBRIUM ) 25 MG capsule 50mg  PO TID x 1D, then 25-50mg  PO BID X 1D, then 25-50mg  PO QD X 1D 07/13/24   Garrick Charleston, MD  folic acid  (FOLVITE ) 1 MG tablet Take 1 tablet (1 mg total) by mouth daily. 05/27/24   Amin, Sumayya, MD  gabapentin  (NEURONTIN ) 100 MG capsule Take 1 capsule (100 mg total) by mouth at bedtime. 06/28/24   Tobie Yetta HERO, MD  levETIRAcetam  (KEPPRA ) 500 MG tablet Take 1 tablet (500 mg total) by mouth 2 (two) times daily. 06/28/24   Tobie Yetta HERO, MD  lipase/protease/amylase (CREON ) 36000 UNITS CPEP capsule Take 1 capsule (36,000 Units total) by mouth 3 (three) times daily before meals. 05/26/24   Amin, Sumayya, MD  nicotine  (NICODERM CQ  - DOSED IN MG/24 HOURS) 14 mg/24hr patch Place 1 patch (14 mg total) onto the skin daily. 04/26/24   Bennett, Christal H, NP  oxyCODONE -acetaminophen  (PERCOCET) 10-325 MG tablet Take 1 tablet by mouth every 8 (eight) hours as needed for pain. 06/28/24 06/28/25  Tobie Yetta HERO, MD  pantoprazole  (PROTONIX ) 20 MG tablet Take 1  tablet (20 mg total) by mouth daily for 14 days. 07/13/24 07/27/24  Garrick Charleston, MD  pantoprazole  (PROTONIX ) 40 MG tablet Take 1 tablet (40 mg total) by mouth daily. 06/29/24   Tobie Yetta HERO, MD  sucralfate  (CARAFATE ) 1 g tablet Take 1 tablet (1 g total) by mouth 4 (four) times daily -  with meals and at bedtime. 07/07/24   Horton, Charmaine FALCON, MD  sucralfate  (CARAFATE ) 1 g tablet Take 1 tablet (1 g total) by mouth 4 (four) times daily -  with meals and at bedtime. 07/13/24   Garrick Charleston, MD  thiamine  (VITAMIN B1) 100 MG tablet Take 1 tablet (100 mg total) by mouth daily. 06/28/24   Tobie Yetta HERO, MD  famotidine  (PEPCID ) 20 MG tablet Take 1 tablet (20 mg total) by mouth 2 (two) times daily. Patient not taking: Reported on 06/01/2019 05/14/19 06/02/19  [name], MD    Allergies: Risperidone  and paliperidone, Tegretol  [carbamazepine ], and Caffeine    Review of Systems  Updated Vital Signs BP 122/82 (BP Location: Right Arm)   Pulse 100   Temp 97.8 F (36.6 C) (Oral)   Resp 18   SpO2 95%   Physical Exam Vitals and nursing note reviewed.  Cardiovascular:     Rate and Rhythm: Regular rhythm.  Neurological:     Mental Status: He is alert.     Comments: Appears intoxicated.  Standing up with handcuffs  on.  Unsteady.     (all labs ordered are listed, but only abnormal results are displayed) Labs Reviewed - No data to display  EKG: None  Radiology: No results found.   Procedures   Medications Ordered in the ED  ziprasidone  (GEODON ) injection 10 mg (10 mg Intramuscular Given 07/16/24 1219)                                    Medical Decision Making Risk Prescription drug management.   Patient came in with mental status change.  Agitation.  Appears somewhat intoxicated but is belligerent.  For safety was given 10 mg of Geodon  IM.  Blood work has been reassuring besides the alcohol  level recently.  Will continue to monitor.   .  Have rechecked and patient  sleeping comfortably.  Care will be turned over to oncoming provider.     Final diagnoses:  Alcohol  intoxication with delirium [facility])    ED Discharge Orders     None          [name], MD 07/16/24 1441

## 2024-07-16 NOTE — ED Triage Notes (Signed)
 PTAR advises pt coming from [employer] threatening people. He asked [employer] to place him in handcuffs. PTAR advised he was threatening homicide with them. PTAR was unable to get vitals with them.

## 2024-07-16 NOTE — ED Triage Notes (Signed)
 Pt reports having a drink after being discharged at 7pm earlier tonight to ward off tremors but feels like he might be starting to withdraw at this time. Pt A&Ox4. NAD noted.

## 2024-07-16 NOTE — ED Provider Notes (Signed)
 [place] EMERGENCY DEPARTMENT AT [facility] Provider Note   CSN: [id number] Arrival date & time: 07/15/24  2224     Patient presents with: Alcohol  Problem   Gary [name] is a 38 y.o. male.   The history is provided by the patient.  Alcohol  Problem This is a chronic problem. The current episode started more than 1 week ago. The problem occurs constantly. The problem has not changed since onset.Pertinent negatives include no chest pain, no headaches and no shortness of breath. Nothing aggravates the symptoms. Nothing relieves the symptoms. He has tried nothing for the symptoms. The treatment provided no relief.  Patient with alcohol  use disorder who drank 2 bottles of wine this evening.  No associated symptoms.        Prior to Admission medications   Medication Sig Start Date End Date Taking? Authorizing Provider  bictegravir-emtricitabine -tenofovir  AF (BIKTARVY ) 50-200-25 MG TABS tablet Take 1 tablet by mouth daily. 02/24/24   Von Bellis, MD  chlordiazePOXIDE  (LIBRIUM ) 25 MG capsule 50mg  PO TID x 1D, then 25-50mg  PO BID X 1D, then 25-50mg  PO QD X 1D 07/13/24   Garrick Charleston, MD  folic acid  (FOLVITE ) 1 MG tablet Take 1 tablet (1 mg total) by mouth daily. 05/27/24   Amin, Sumayya, MD  gabapentin  (NEURONTIN ) 100 MG capsule Take 1 capsule (100 mg total) by mouth at bedtime. 06/28/24   Tobie Yetta HERO, MD  levETIRAcetam  (KEPPRA ) 500 MG tablet Take 1 tablet (500 mg total) by mouth 2 (two) times daily. 06/28/24   Tobie Yetta HERO, MD  lipase/protease/amylase (CREON ) 36000 UNITS CPEP capsule Take 1 capsule (36,000 Units total) by mouth 3 (three) times daily before meals. 05/26/24   Amin, Sumayya, MD  nicotine  (NICODERM CQ  - DOSED IN MG/24 HOURS) 14 mg/24hr patch Place 1 patch (14 mg total) onto the skin daily. 04/26/24   Blair Robin H, NP  oxyCODONE -acetaminophen  (PERCOCET) 10-325 MG tablet Take 1 tablet by mouth every 8 (eight) hours as needed for pain. 06/28/24 06/28/25   Tobie Yetta HERO, MD  pantoprazole  (PROTONIX ) 20 MG tablet Take 1 tablet (20 mg total) by mouth daily for 14 days. 07/13/24 07/27/24  Garrick Charleston, MD  pantoprazole  (PROTONIX ) 40 MG tablet Take 1 tablet (40 mg total) by mouth daily. 06/29/24   Tobie Yetta HERO, MD  sucralfate  (CARAFATE ) 1 g tablet Take 1 tablet (1 g total) by mouth 4 (four) times daily -  with meals and at bedtime. 07/07/24   Horton, Charmaine FALCON, MD  sucralfate  (CARAFATE ) 1 g tablet Take 1 tablet (1 g total) by mouth 4 (four) times daily -  with meals and at bedtime. 07/13/24   Garrick Charleston, MD  thiamine  (VITAMIN B1) 100 MG tablet Take 1 tablet (100 mg total) by mouth daily. 06/28/24   Tobie Yetta HERO, MD  famotidine  (PEPCID ) 20 MG tablet Take 1 tablet (20 mg total) by mouth 2 (two) times daily. Patient not taking: Reported on 06/01/2019 05/14/19 06/02/19  [name], MD    Allergies: Risperidone  and paliperidone, Tegretol  [carbamazepine ], and Caffeine    Review of Systems  Respiratory:  Negative for shortness of breath.   Cardiovascular:  Negative for chest pain.  Gastrointestinal:  Negative for nausea and vomiting.  Neurological:  Negative for headaches.  All other systems reviewed and are negative.   Updated Vital Signs BP (!) 131/95 (BP Location: Left Arm)   Pulse (!) 118   Temp 98.4 F (36.9 C) (Oral)   Resp 18   Ht  6' (1.829 m)   Wt 68 kg   SpO2 97%   BMI 20.34 kg/m   Physical Exam Vitals and nursing note reviewed.  Constitutional:      General: He is not in acute distress.    Appearance: He is well-developed. He is not diaphoretic.  HENT:     Head: Normocephalic and atraumatic.     Nose: Nose normal.  Eyes:     Conjunctiva/sclera: Conjunctivae normal.     Pupils: Pupils are equal, round, and reactive to light.  Cardiovascular:     Rate and Rhythm: Normal rate and regular rhythm.     Pulses: Normal pulses.     Heart sounds: Normal heart sounds.  Pulmonary:     Effort: Pulmonary effort is normal.      Breath sounds: Normal breath sounds. No wheezing or rales.  Abdominal:     General: Bowel sounds are normal.     Palpations: Abdomen is soft.     Tenderness: There is no abdominal tenderness. There is no guarding or rebound.  Musculoskeletal:        General: Normal range of motion.     Cervical back: Normal range of motion and neck supple.  Skin:    General: Skin is warm and dry.     Capillary Refill: Capillary refill takes less than 2 seconds.  Neurological:     General: No focal deficit present.     Mental Status: He is alert and oriented to person, place, and time.     Deep Tendon Reflexes: Reflexes normal.  Psychiatric:        Thought Content: Thought content does not include homicidal or suicidal ideation. Thought content does not include homicidal or suicidal plan.     (all labs ordered are listed, but only abnormal results are displayed) Results for orders placed or performed during the hospital encounter of 07/15/24  Comprehensive metabolic panel   Collection Time: 07/15/24 10:41 PM  Result Value Ref Range   Sodium 140 135 - 145 mmol/L   Potassium 4.0 3.5 - 5.1 mmol/L   Chloride 103 98 - 111 mmol/L   CO2 23 22 - 32 mmol/L   Glucose, Bld 131 (H) 70 - 99 mg/dL   BUN 8 6 - 20 mg/dL   Creatinine, Ser 9.31 0.61 - 1.24 mg/dL   Calcium  8.5 (L) 8.9 - 10.3 mg/dL   Total Protein 6.9 6.5 - 8.1 g/dL   Albumin 4.3 3.5 - 5.0 g/dL   AST 62 (H) 15 - 41 U/L   ALT 44 0 - 44 U/L   Alkaline Phosphatase 59 38 - 126 U/L   Total Bilirubin 0.3 0.0 - 1.2 mg/dL   GFR, Estimated >39 >39 mL/min   Anion gap 13 5 - 15  Ethanol   Collection Time: 07/15/24 10:41 PM  Result Value Ref Range   Alcohol , Ethyl (B) 449 (HH) <15 mg/dL  cbc   Collection Time: 07/15/24 10:41 PM  Result Value Ref Range   WBC 4.6 4.0 - 10.5 K/uL   RBC 3.95 (L) 4.22 - 5.81 MIL/uL   Hemoglobin 11.4 (L) 13.0 - 17.0 g/dL   HCT 65.2 (L) 60.9 - 47.9 %   MCV 87.8 80.0 - 100.0 fL   MCH 28.9 26.0 - 34.0 pg   MCHC 32.9  30.0 - 36.0 g/dL   RDW 78.4 (H) 88.4 - 84.4 %   Platelets 113 (L) 150 - 400 K/uL   nRBC 0.0 0.0 - 0.2 %   CT  ABDOMEN PELVIS W CONTRAST Result Date: 06/16/2024 CLINICAL DATA:  Pancreatitis, acute, severe.  Abdominal pain EXAM: CT ABDOMEN AND PELVIS WITH CONTRAST TECHNIQUE: Multidetector CT imaging of the abdomen and pelvis was performed using the standard protocol following bolus administration of intravenous contrast. RADIATION DOSE REDUCTION: This exam was performed according to the departmental dose-optimization program which includes automated exposure control, adjustment of the mA and/or kV according to patient size and/or use of iterative reconstruction technique. CONTRAST:  OMNIPAQUE  IOHEXOL  300 MG/ML  SOLN COMPARISON:  CT abdomen pelvis 05/22/2024, CT abdomen pelvis 05/08/2024, MR abdomen 03/01/2024 FINDINGS: Lower chest: No acute abnormality. Hepatobiliary: The hepatic parenchyma is diffusely hypodense compared to the splenic parenchyma consistent with fatty infiltration. No focal liver abnormality. No gallstones, gallbladder wall thickening, or pericholecystic fluid. No biliary dilatation. Pancreas: Poorly visualized pancreatic head and neck hypodensity measuring up to 2.5 cm (8:72) with slightly irregular edematous proximal pancreatic parenchyma. No main pancreatic ductal dilatation. Spleen: Normal in size without focal abnormality. Adrenals/Urinary Tract: No adrenal nodule bilaterally. Bilateral kidneys enhance symmetrically. No hydronephrosis. No hydroureter. Mild smooth anterior urinary bladder wall thickening (10:85) likely due to under distension. Otherwise unremarkable urinary bladder. Stomach/Bowel: Stomach is within normal limits. No evidence of bowel wall thickening or dilatation. Colonic diverticulosis. Appendix appears normal. Vascular/Lymphatic: No abdominal aorta or iliac aneurysm. No abdominal, pelvic, or inguinal lymphadenopathy. Reproductive: Prostate is unremarkable. Other: No  intraperitoneal free fluid. No intraperitoneal free gas. No organized fluid collection. Musculoskeletal: No abdominal wall hernia or abnormality. No suspicious lytic or blastic osseous lesions. No acute displaced fracture. IMPRESSION: 1. Poorly visualized pancreatic head and neck hypodensity measuring up to 2.5 cm with slightly irregular edematous proximal pancreatic parenchyma. Findings suggestive of acute pancreatitis with developing pseudocyst not excluded. 2. Mild anterior urinary bladder wall thickening likely due to under distension anteriorly. Consider correlation with urinalysis. Recommend attention on follow-up. 3. Colonic diverticulosis with no acute diverticulitis. 4. Prostatomegaly. Electronically Signed   By: Morgane  Naveau M.D.   On: 06/16/2024 23:32     EKG: None  Radiology: No results found.   Procedures   Medications Ordered in the ED  haloperidol  lactate (HALDOL ) injection 2 mg (2 mg Intramuscular Given 07/16/24 0020)                                    Medical Decision Making Patient presents with alcohol  intoxication   Amount and/or Complexity of Data Reviewed External Data Reviewed: notes.    Details: Previous ED visits reviewed  Labs: ordered.    Details: Normal sodium 140, normal potassium 4, normal creatinine 0.68.  Elevated alcohol  449.  Normal white count 4.6, normal hemoglobin   Risk Prescription drug management. Risk Details: Patient is sleeping comfortably in the room.      Final diagnoses:  Alcoholic intoxication without complication (HCC)   No signs of systemic illness or infection. The patient is nontoxic-appearing on exam and vital signs are within normal limits.  I have reviewed the triage vital signs and the nursing notes. Pertinent labs & imaging results that were available during my care of the patient were reviewed by me and considered in my medical decision making (see chart for details). After history, exam, and medical workup I feel the  patient has been appropriately medically screened and is safe for discharge home. Pertinent diagnoses were discussed with the patient. Patient was given return precautions.  ED Discharge Orders  None          Alaura Schippers, MD 07/16/24 [phone]

## 2024-07-17 NOTE — ED Notes (Signed)
 Pt awake and alert, A&Ox4, ambulating independently to and from bathroom at this time.

## 2024-07-17 NOTE — ED Provider Notes (Signed)
  EMERGENCY DEPARTMENT AT [facility] Provider Note  CSN: [id number] Arrival date & time: 07/16/24 2241  Chief Complaint(s) Alcohol  Intoxication  HPI Gary [name] is a 38 y.o. male with a past medical history listed below who was brought in by EMS after they were called out to local coffee shop.  Patient was reportedly intoxicated on scene.  Admits to drinking alcohol  after being discharged earlier this evening.  No other physical complaints.  HPI  Past Medical History Past Medical History:  Diagnosis Date   Alcohol  abuse    Alcohol -induced pancreatitis 04/16/2022   Anxiety    Bipolar 2 disorder (HCC)    HIV (human immunodeficiency virus infection) (HCC)    Pancreatitis    PTSD (post-traumatic stress disorder)    Schizophrenia (HCC)    Seizures (HCC)    Subdural hematoma (HCC)    Patient Active Problem List   Diagnosis Date Noted   Acute pancreatitis 06/23/2024   Depression with anxiety 05/21/2024   GERD (gastroesophageal reflux disease) 05/21/2024   Hypercalcemia 05/21/2024   Dehydration 05/19/2024   Heat exposure 05/19/2024   Epigastric pain 05/09/2024   SIRS (systemic inflammatory response syndrome) (HCC) 05/09/2024   Bipolar 2 disorder (HCC) 05/09/2024   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   Brief psychotic disorder (HCC) 04/20/2024   Hypotension 03/31/2024   High risk for readmission 03/01/2024   Alcohol  withdrawal (HCC) 02/25/2024   Severe protein-calorie malnutrition (HCC) 02/25/2024   Alcohol  induced acute pancreatitis 02/14/2024   Pancreatic lesion 02/13/2024   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/2024   Ileus (HCC) 12/25/2023   PTSD (post-traumatic stress disorder) 12/25/2023   Anxiety state 12/20/2023   Pancreatitis, necrotizing 12/17/2023   Tobacco use disorder 12/17/2023   Acute alcoholic pancreatitis 12/10/2023   Acute on chronic anemia 12/05/2023   Seizure (HCC) 11/19/2023   Gastritis and duodenitis 11/19/2023    Nausea and vomiting 11/01/2023   Chronic pancreatitis (HCC) 10/16/2023   Acute on chronic pancreatitis (HCC) 10/16/2023   Alcoholic fatty liver 09/25/2023   Hypomagnesemia 12/24/2022   Seizure disorder (HCC) 12/17/2022   Pancytopenia (HCC) 12/14/2022   Alcohol -induced pancreatitis 12/14/2022   Delirium tremens (HCC) 10/19/2022   Alcohol  dependence with withdrawal (HCC) 10/16/2022   Homelessness 05/28/2022   Metabolic acidosis, increased anion gap 04/16/2022   Hypokalemia 01/22/2022   Constipation 07/30/2021   Alcohol  abuse    Malingering 05/15/2020   Alcoholic intoxication without complication (HCC) 07/02/2019   Alcohol -induced mood disorder (HCC) 05/13/2019   Alcohol  use disorder, severe, dependence (HCC) 04/16/2019   Severe episode of recurrent major depressive disorder, without psychotic features (HCC) 04/16/2019   HIV (human immunodeficiency virus infection) (HCC) 06/16/2018   Polysubstance abuse (HCC) 06/16/2018   Nicotine  dependence, cigarettes, uncomplicated 06/16/2018   Home Medication(s) Prior to Admission medications   Medication Sig Start Date End Date Taking? Authorizing Provider  bictegravir-emtricitabine -tenofovir  AF (BIKTARVY ) 50-200-25 MG TABS tablet Take 1 tablet by mouth daily. 02/24/24   Von Bellis, MD  chlordiazePOXIDE  (LIBRIUM ) 25 MG capsule 50mg  PO TID x 1D, then 25-50mg  PO BID X 1D, then 25-50mg  PO QD X 1D 07/13/24   Garrick Charleston, MD  folic acid  (FOLVITE ) 1 MG tablet Take 1 tablet (1 mg total) by mouth daily. 05/27/24   Amin, Sumayya, MD  gabapentin  (NEURONTIN ) 100 MG capsule Take 1 capsule (100 mg total) by mouth at bedtime. 06/28/24   Tobie Yetta HERO, MD  levETIRAcetam  (KEPPRA ) 500 MG tablet Take 1 tablet (500 mg total) by mouth 2 (two) times  daily. 06/28/24   Tobie Yetta HERO, MD  lipase/protease/amylase (CREON ) 36000 UNITS CPEP capsule Take 1 capsule (36,000 Units total) by mouth 3 (three) times daily before meals. 05/26/24   Amin, Sumayya, MD  nicotine   (NICODERM CQ  - DOSED IN MG/24 HOURS) 14 mg/24hr patch Place 1 patch (14 mg total) onto the skin daily. 04/26/24   Bennett, Christal H, NP  oxyCODONE -acetaminophen  (PERCOCET) 10-325 MG tablet Take 1 tablet by mouth every 8 (eight) hours as needed for pain. 06/28/24 06/28/25  Tobie Yetta HERO, MD  pantoprazole  (PROTONIX ) 20 MG tablet Take 1 tablet (20 mg total) by mouth daily for 14 days. 07/13/24 07/27/24  Garrick Charleston, MD  pantoprazole  (PROTONIX ) 40 MG tablet Take 1 tablet (40 mg total) by mouth daily. 06/29/24   Tobie Yetta HERO, MD  sucralfate  (CARAFATE ) 1 g tablet Take 1 tablet (1 g total) by mouth 4 (four) times daily -  with meals and at bedtime. 07/07/24   Horton, Charmaine FALCON, MD  sucralfate  (CARAFATE ) 1 g tablet Take 1 tablet (1 g total) by mouth 4 (four) times daily -  with meals and at bedtime. 07/13/24   Garrick Charleston, MD  thiamine  (VITAMIN B1) 100 MG tablet Take 1 tablet (100 mg total) by mouth daily. 06/28/24   Tobie Yetta HERO, MD  famotidine  (PEPCID ) 20 MG tablet Take 1 tablet (20 mg total) by mouth 2 (two) times daily. Patient not taking: Reported on 06/01/2019 05/14/19 06/02/19  [name], MD                                                                                                                                    Allergies Risperidone  and paliperidone, Tegretol  [carbamazepine ], and Caffeine  Review of Systems Review of Systems As noted in HPI  Physical Exam Vital Signs  I have reviewed the triage vital signs BP 118/76   Pulse 95   Temp 98.5 F (36.9 C)   Resp 16   Ht 6' (1.829 m)   Wt 68 kg   SpO2 98%   BMI 20.34 kg/m   Physical Exam Vitals reviewed.  Constitutional:      General: He is not in acute distress.    Appearance: He is well-developed. He is not diaphoretic.     Comments: Clinically intoxicated  HENT:     Head: Normocephalic and atraumatic.     Right Ear: External ear normal.     Left Ear: External ear normal.     Nose: Nose normal.      Mouth/Throat:     Mouth: Mucous membranes are moist.  Eyes:     General: No scleral icterus.    Conjunctiva/sclera: Conjunctivae normal.  Neck:     Trachea: Phonation normal.  Cardiovascular:     Rate and Rhythm: Normal rate and regular rhythm.  Pulmonary:     Effort: Pulmonary effort is normal. No respiratory distress.     Breath sounds:  No stridor.  Abdominal:     General: There is no distension.  Musculoskeletal:        General: Normal range of motion.     Cervical back: Normal range of motion.  Neurological:     Mental Status: He is alert and oriented to person, place, and time.  Psychiatric:        Behavior: Behavior normal.     ED Results and Treatments Labs (all labs ordered are listed, but only abnormal results are displayed) Labs Reviewed  CBG MONITORING, ED - Abnormal; Notable for the following components:      Result Value   Glucose-Capillary 178 (*)    All other components within normal limits                                                                                                                         EKG  EKG Interpretation Date/Time:    Ventricular Rate:    PR Interval:    QRS Duration:    QT Interval:    QTC Calculation:   R Axis:      Text Interpretation:         Radiology No results found.  Medications Ordered in ED Medications - No data to display Procedures Procedures  (including critical care time) Medical Decision Making / ED Course   Medical Decision Making   Patient is  male returns for similar presentations for alcohol  intoxication.  No signs of trauma noted on exam requiring imaging at this time.  Patient will need to metabolize to freedom Clinical Course as of 07/17/24 9364  [facility] Jul 17, 2024  9364 Clinically sober [PC]    Clinical Course User Index [PC] Reyden Smith, Raynell Moder, MD    Final Clinical Impression(s) / ED Diagnoses Final diagnoses:  Alcoholic intoxication without complication [facility])    This  chart was dictated using voice recognition software.  Despite best efforts to proofread,  errors can occur which can change the documentation meaning.    Trine Raynell Moder, MD 07/17/24 [phone]

## 2024-07-21 ENCOUNTER — Emergency Department (HOSPITAL_COMMUNITY)
Admission: EM | Admit: 2024-07-21 | Discharge: 2024-07-22 | Discharge status: Against Medical Advice | Payer: MEDICAID | Attending: Emergency Medicine | Admitting: Emergency Medicine

## 2024-07-21 ENCOUNTER — Other Ambulatory Visit: Payer: Self-pay

## 2024-07-21 ENCOUNTER — Encounter (HOSPITAL_COMMUNITY): Payer: Self-pay

## 2024-07-21 DIAGNOSIS — Z5321 Procedure and treatment not carried out due to patient leaving prior to being seen by health care provider: Secondary | ICD-10-CM | POA: Insufficient documentation

## 2024-07-21 DIAGNOSIS — Y909 Presence of alcohol in blood, level not specified: Secondary | ICD-10-CM | POA: Insufficient documentation

## 2024-07-21 DIAGNOSIS — F10129 Alcohol abuse with intoxication, unspecified: Secondary | ICD-10-CM | POA: Diagnosis present

## 2024-07-21 LAB — I-STAT CHEM 8, ED
BUN: 9 mg/dL (ref 6–20)
Calcium, Ion: 0.95 mmol/L — ABNORMAL LOW (ref 1.15–1.40)
Chloride: 105 mmol/L (ref 98–111)
Creatinine, Ser: 1 mg/dL (ref 0.61–1.24)
Glucose, Bld: 100 mg/dL — ABNORMAL HIGH (ref 70–99)
HCT: 40 % (ref 39.0–52.0)
Hemoglobin: 13.6 g/dL (ref 13.0–17.0)
Potassium: 3.9 mmol/L (ref 3.5–5.1)
Sodium: 142 mmol/L (ref 135–145)
TCO2: 25 mmol/L (ref 22–32)

## 2024-07-21 NOTE — ED Provider Triage Note (Signed)
 Emergency Medicine Provider Triage Evaluation Note  Gary [name] , a 38 y.o. male  was evaluated in triage.  Pt complains of intoxication.  Review of Systems  Positive: No SI or HI Negative: Denies any other drug use  Physical Exam  BP 116/86 (BP Location: Right Arm)   Pulse 94   Temp 98 F (36.7 C)   Resp 16   Ht 1.829 m (6')   Wt 68 kg   SpO2 98%   BMI 20.34 kg/m  Gen:   Lethargic but arousable Resp:  Normal effort  MSK:   Moves extremities without difficulty   Medical Decision Making  Medically screening exam initiated at 3:34 PM.  Appropriate orders placed.  Gary [name] was informed that the remainder of the evaluation will be completed by another provider, this initial triage assessment does not replace that evaluation, and the importance of remaining in the ED until their evaluation is complete.     Gary Faden, MD 07/21/24 1535

## 2024-07-21 NOTE — ED Triage Notes (Signed)
 Pt fell asleep at [place] after drinking. Pt drinks everyday. Last drink was an hour ago. Denies drug usage.

## 2024-07-22 NOTE — ED Notes (Signed)
 Pt name called for updated vitals, no response

## 2024-07-23 ENCOUNTER — Emergency Department (HOSPITAL_COMMUNITY)
Admission: EM | Admit: 2024-07-23 | Discharge: 2024-07-23 | Disposition: A | Discharge status: Home / Self Care | Payer: MEDICAID | Attending: Emergency Medicine | Admitting: Emergency Medicine

## 2024-07-23 DIAGNOSIS — Z21 Asymptomatic human immunodeficiency virus [HIV] infection status: Secondary | ICD-10-CM | POA: Diagnosis not present

## 2024-07-23 DIAGNOSIS — F10229 Alcohol dependence with intoxication, unspecified: Secondary | ICD-10-CM | POA: Diagnosis present

## 2024-07-23 DIAGNOSIS — F10929 Alcohol use, unspecified with intoxication, unspecified: Secondary | ICD-10-CM

## 2024-07-23 MED ORDER — THIAMINE HCL 100 MG/ML IJ SOLN
100.0000 mg | Freq: Once | INTRAMUSCULAR | Status: AC
  Administered 2024-07-23: 100 mg via INTRAVENOUS
  Filled 2024-07-23: qty 2

## 2024-07-23 MED ORDER — SODIUM CHLORIDE 0.9 % IV BOLUS
1000.0000 mL | Freq: Once | INTRAVENOUS | Status: AC
  Administered 2024-07-23: 1000 mL via INTRAVENOUS

## 2024-07-23 NOTE — Discharge Instructions (Signed)

## 2024-07-23 NOTE — ED Provider Notes (Signed)
 Emergency Department Provider Note   I have reviewed the triage vital signs and the nursing notes.   HISTORY  Chief Complaint Alcohol  Intoxication   HPI Gary [name] is a 38 y.o. male with past history reviewed below including chronic alcohol  abuse and multiple ED presentations in the past for similar presents to the emergency department after being found on the sidewalk out in front of [facility].  A bystander called for assistance.  Patient states he has had around 4 bottles of wine today.  He is unsure how he got on the ground.  He does not recall any fall or being assaulted.  He does not have any specific complaints at this time. Level 5 caveat: EtOH intoxication.    Past Medical History:  Diagnosis Date   Alcohol  abuse    Alcohol -induced pancreatitis 04/16/2022   Anxiety    Bipolar 2 disorder (HCC)    HIV (human immunodeficiency virus infection) (HCC)    Pancreatitis    PTSD (post-traumatic stress disorder)    Schizophrenia (HCC)    Seizures (HCC)    Subdural hematoma (HCC)     Review of Systems  Level 5 caveat: Intoxication (EtOH).   ____________________________________________   PHYSICAL EXAM:  VITAL SIGNS: ED Triage Vitals  Encounter Vitals Group     BP 07/23/24 0057 110/81     Pulse Rate 07/23/24 0057 92     Resp 07/23/24 0057 16     Temp 07/23/24 0057 98.5 F (36.9 C)     Temp src --      SpO2 07/23/24 0057 100 %   Constitutional: Opens eyes when I enter the room. Slurred speech and smells of EtOH.  Eyes: Conjunctivae are normal. PERRL 3 mm bilaterally.  Head: Atraumatic. No outward signs of trauma.  Nose: No congestion/rhinnorhea. Mouth/Throat: Mucous membranes are moist.   Neck: No stridor.   Cardiovascular: Normal rate, regular rhythm. Good peripheral circulation. Grossly normal heart sounds.   Respiratory: Normal respiratory effort.  No retractions. Lungs CTAB. Gastrointestinal: Soft and nontender. No distention.  Musculoskeletal: No  gross deformities of extremities. Neurologic:  Normal speech and language. No gross focal neurologic deficits are appreciated.  Skin:  Skin is warm, dry and intact. No rash noted.  ____________________________________________   PROCEDURES  Procedure(s) performed:   Procedures  None  ____________________________________________   INITIAL IMPRESSION / ASSESSMENT AND PLAN / ED COURSE  Pertinent labs & imaging results that were available during my care of the patient were reviewed by me and considered in my medical decision making (see chart for details).   This patient is Presenting for Evaluation of EtOH intoxication, which does require a range of treatment options, and is a complaint that involves a high risk of morbidity and mortality.  The Differential Diagnoses includes but is not exclusive to alcohol , illicit or prescription medications, intracranial pathology such as stroke, intracerebral hemorrhage, fever or infectious causes including sepsis, hypoxemia, uremia, trauma, endocrine related disorders such as diabetes, hypoglycemia, thyroid -related diseases, etc.   Critical Interventions-    Medications  sodium chloride  0.9 % bolus 1,000 mL (0 mLs Intravenous Stopped 07/23/24 0317)  thiamine  (VITAMIN B1) injection 100 mg (100 mg Intravenous Given 07/23/24 0156)    Reassessment after intervention: patient metabolizing.    I decided to review pertinent External Data, and in summary patient with frequent ED visits for similar.   Clinical Laboratory Tests: Considered lab work but patient with normal vitals and presentation is consistent with past presentations for alcohol  intoxication. Defer  labs for now.   Radiologic Tests: Considered CT imaging of the head but no outward sign of trauma.  Patient is clinically intoxicated consistent with prior presentations.  Defer neuroimaging for now.  Cardiac Monitor Tracing which shows NSR.    Social Determinants of Health Risk Positive  EtOH abuse history.   Medical Decision Making: Summary:  The patient presents emergency department with apparent alcohol  intoxication.  Patient has been drinking heavily tonight.  Stigmata of seizure on exam.  Plan for patient to metabolize here in the ED.  He is on monitor.  Plan to defer labs and neuroimaging as above.  Reevaluation with update and discussion with patient. More awake in the AM. Stable for discharge.    Patient's presentation is most consistent with acute presentation with potential threat to life or bodily function.   Disposition: discharge  ____________________________________________  FINAL CLINICAL IMPRESSION(S) / ED DIAGNOSES  Final diagnoses:  Alcoholic intoxication with complication [facility])    Note:  This document was prepared using Dragon voice recognition software and may include unintentional dictation errors.  Fonda Law, MD, [facility] Emergency Medicine    Cambrie Sonnenfeld, Fonda MATSU, MD 07/23/24 [phone]

## 2024-07-23 NOTE — ED Triage Notes (Signed)
 Pt arrives via [facility] from sleeping on the sidewalk in front of [facility]. Bystander called and pt reported that he has hx of seizures, and possibly had a seizure today but unsure. Pt reportedly drank 4 bottles of wine today just before coming in. NAD noted during triage.

## 2024-07-24 ENCOUNTER — Encounter (HOSPITAL_COMMUNITY): Payer: Self-pay

## 2024-07-24 ENCOUNTER — Other Ambulatory Visit: Payer: Self-pay

## 2024-07-24 ENCOUNTER — Emergency Department (HOSPITAL_COMMUNITY)
Admission: EM | Admit: 2024-07-24 | Discharge: 2024-07-25 | Disposition: A | Discharge status: Home / Self Care | Payer: MEDICAID | Source: Home / Self Care | Attending: Emergency Medicine | Admitting: Emergency Medicine

## 2024-07-24 ENCOUNTER — Emergency Department (HOSPITAL_COMMUNITY)
Admission: EM | Admit: 2024-07-24 | Discharge: 2024-07-24 | Disposition: A | Discharge status: Home / Self Care | Payer: MEDICAID | Attending: Emergency Medicine | Admitting: Emergency Medicine

## 2024-07-24 DIAGNOSIS — Y908 Blood alcohol level of 240 mg/100 ml or more: Secondary | ICD-10-CM | POA: Insufficient documentation

## 2024-07-24 DIAGNOSIS — Z720 Tobacco use: Secondary | ICD-10-CM | POA: Insufficient documentation

## 2024-07-24 DIAGNOSIS — F10920 Alcohol use, unspecified with intoxication, uncomplicated: Secondary | ICD-10-CM

## 2024-07-24 DIAGNOSIS — Z21 Asymptomatic human immunodeficiency virus [HIV] infection status: Secondary | ICD-10-CM | POA: Insufficient documentation

## 2024-07-24 DIAGNOSIS — F10120 Alcohol abuse with intoxication, uncomplicated: Secondary | ICD-10-CM | POA: Insufficient documentation

## 2024-07-24 LAB — CBC WITH DIFFERENTIAL/PLATELET
Abs Immature Granulocytes: 0 K/uL (ref 0.00–0.07)
Abs Immature Granulocytes: 0.01 K/uL (ref 0.00–0.07)
Basophils Absolute: 0 K/uL (ref 0.0–0.1)
Basophils Absolute: 0 K/uL (ref 0.0–0.1)
Basophils Relative: 1 %
Basophils Relative: 1 %
Eosinophils Absolute: 0.1 K/uL (ref 0.0–0.5)
Eosinophils Absolute: 0.1 K/uL (ref 0.0–0.5)
Eosinophils Relative: 3 %
Eosinophils Relative: 2 %
HCT: 29.5 % — ABNORMAL LOW (ref 39.0–52.0)
HCT: 33.9 % — ABNORMAL LOW (ref 39.0–52.0)
Hemoglobin: 9.7 g/dL — ABNORMAL LOW (ref 13.0–17.0)
Hemoglobin: 11 g/dL — ABNORMAL LOW (ref 13.0–17.0)
Immature Granulocytes: 0 %
Immature Granulocytes: 0 %
Lymphocytes Relative: 49 %
Lymphocytes Relative: 41 %
Lymphs Abs: 1.4 K/uL (ref 0.7–4.0)
Lymphs Abs: 1.4 K/uL (ref 0.7–4.0)
MCH: 29.2 pg (ref 26.0–34.0)
MCH: 28.4 pg (ref 26.0–34.0)
MCHC: 32.9 g/dL (ref 30.0–36.0)
MCHC: 32.4 g/dL (ref 30.0–36.0)
MCV: 88.9 fL (ref 80.0–100.0)
MCV: 87.4 fL (ref 80.0–100.0)
Monocytes Absolute: 0.3 K/uL (ref 0.1–1.0)
Monocytes Absolute: 0.3 K/uL (ref 0.1–1.0)
Monocytes Relative: 11 %
Monocytes Relative: 8 %
Neutro Abs: 1 K/uL — ABNORMAL LOW (ref 1.7–7.7)
Neutro Abs: 1.7 K/uL (ref 1.7–7.7)
Neutrophils Relative %: 36 %
Neutrophils Relative %: 48 %
Platelets: 52 K/uL — ABNORMAL LOW (ref 150–400)
Platelets: 57 K/uL — ABNORMAL LOW (ref 150–400)
RBC: 3.32 MIL/uL — ABNORMAL LOW (ref 4.22–5.81)
RBC: 3.88 MIL/uL — ABNORMAL LOW (ref 4.22–5.81)
RDW: 22.8 % — ABNORMAL HIGH (ref 11.5–15.5)
RDW: 22.8 % — ABNORMAL HIGH (ref 11.5–15.5)
Smear Review: NORMAL
WBC: 2.8 K/uL — ABNORMAL LOW (ref 4.0–10.5)
WBC: 3.5 K/uL — ABNORMAL LOW (ref 4.0–10.5)
nRBC: 0 % (ref 0.0–0.2)
nRBC: 0 % (ref 0.0–0.2)

## 2024-07-24 LAB — COMPREHENSIVE METABOLIC PANEL WITH GFR
ALT: 45 U/L — ABNORMAL HIGH (ref 0–44)
ALT: 47 U/L — ABNORMAL HIGH (ref 0–44)
AST: 87 U/L — ABNORMAL HIGH (ref 15–41)
AST: 85 U/L — ABNORMAL HIGH (ref 15–41)
Albumin: 3.9 g/dL (ref 3.5–5.0)
Albumin: 4.2 g/dL (ref 3.5–5.0)
Alkaline Phosphatase: 51 U/L (ref 38–126)
Alkaline Phosphatase: 56 U/L (ref 38–126)
Anion gap: 14 (ref 5–15)
Anion gap: 15 (ref 5–15)
BUN: 9 mg/dL (ref 6–20)
BUN: <5 mg/dL — ABNORMAL LOW (ref 6–20)
CO2: 23 mmol/L (ref 22–32)
CO2: 23 mmol/L (ref 22–32)
Calcium: 8.6 mg/dL — ABNORMAL LOW (ref 8.9–10.3)
Calcium: 8.7 mg/dL — ABNORMAL LOW (ref 8.9–10.3)
Chloride: 104 mmol/L (ref 98–111)
Chloride: 101 mmol/L (ref 98–111)
Creatinine, Ser: 0.58 mg/dL — ABNORMAL LOW (ref 0.61–1.24)
Creatinine, Ser: 0.44 mg/dL — ABNORMAL LOW (ref 0.61–1.24)
GFR, Estimated: >60 mL/min (ref >60)
GFR, Estimated: >60 mL/min (ref >60)
Glucose, Bld: 122 mg/dL — ABNORMAL HIGH (ref 70–99)
Glucose, Bld: 95 mg/dL (ref 70–99)
Potassium: 3.6 mmol/L (ref 3.5–5.1)
Potassium: 3.9 mmol/L (ref 3.5–5.1)
Sodium: 141 mmol/L (ref 135–145)
Sodium: 138 mmol/L (ref 135–145)
Total Bilirubin: 0.3 mg/dL (ref 0.0–1.2)
Total Bilirubin: 0.4 mg/dL (ref 0.0–1.2)
Total Protein: 6.2 g/dL — ABNORMAL LOW (ref 6.5–8.1)
Total Protein: 6.8 g/dL (ref 6.5–8.1)

## 2024-07-24 LAB — ETHANOL
Alcohol, Ethyl (B): 407 mg/dL (ref <15)
Alcohol, Ethyl (B): 397 mg/dL (ref <15)

## 2024-07-24 LAB — LIPASE, BLOOD
Lipase: 53 U/L — ABNORMAL HIGH (ref 11–51)
Lipase: 48 U/L (ref 11–51)

## 2024-07-24 MED ORDER — THIAMINE MONONITRATE 100 MG PO TABS
100.0000 mg | ORAL_TABLET | Freq: Every day | ORAL | Status: DC
  Administered 2024-07-24: 100 mg via ORAL
  Filled 2024-07-24: qty 1

## 2024-07-24 MED ORDER — LORAZEPAM 2 MG/ML IJ SOLN: 1.0000 mg | INTRAMUSCULAR | Status: DC | PRN

## 2024-07-24 MED ORDER — SODIUM CHLORIDE 0.9 % IV BOLUS
1000.0000 mL | Freq: Once | INTRAVENOUS | Status: AC
  Administered 2024-07-24: 1000 mL via INTRAVENOUS

## 2024-07-24 MED ORDER — LORAZEPAM 1 MG PO TABS: 1.0000 mg | ORAL_TABLET | ORAL | Status: DC | PRN

## 2024-07-24 MED ORDER — LORAZEPAM 2 MG/ML IJ SOLN: 0.0000 mg | Freq: Two times a day (BID) | INTRAMUSCULAR | Status: DC

## 2024-07-24 MED ORDER — HALOPERIDOL LACTATE 5 MG/ML IJ SOLN
2.0000 mg | Freq: Once | INTRAMUSCULAR | Status: AC
Start: 2024-07-24 — End: 2024-07-24
  Administered 2024-07-24: 2 mg via INTRAVENOUS
  Filled 2024-07-24: qty 1

## 2024-07-24 MED ORDER — ADULT MULTIVITAMIN W/MINERALS CH
1.0000 | ORAL_TABLET | Freq: Every day | ORAL | Status: DC
  Administered 2024-07-24: 1 via ORAL
  Filled 2024-07-24: qty 1

## 2024-07-24 MED ORDER — THIAMINE HCL 100 MG/ML IJ SOLN: 100.0000 mg | Freq: Every day | INTRAMUSCULAR | Status: DC

## 2024-07-24 MED ORDER — FOLIC ACID 1 MG PO TABS
1.0000 mg | ORAL_TABLET | Freq: Every day | ORAL | Status: DC
  Administered 2024-07-24: 1 mg via ORAL
  Filled 2024-07-24: qty 1

## 2024-07-24 MED ORDER — LORAZEPAM 2 MG/ML IJ SOLN
0.0000 mg | Freq: Four times a day (QID) | INTRAMUSCULAR | Status: DC
  Administered 2024-07-24: 2 mg via INTRAVENOUS
  Administered 2024-07-24: 4 mg via INTRAVENOUS
  Filled 2024-07-24: qty 2
  Filled 2024-07-24: qty 1

## 2024-07-24 NOTE — ED Notes (Signed)
 ED Provider at bedside.

## 2024-07-24 NOTE — ED Triage Notes (Signed)
 Pt states he is wanting detox from alcohol , states he had a seizure PTA. Drank 2 bottles of wine 2 hours PTA to prevent further withdrawals.

## 2024-07-24 NOTE — ED Notes (Signed)
Patient is sleeping comfortably.

## 2024-07-24 NOTE — ED Provider Notes (Signed)
 [place] EMERGENCY DEPARTMENT AT [facility] Provider Note   CSN: [id number] Arrival date & time: 07/24/24  9975     Patient presents with: Alcohol  Problem   Gary [name] is a 38 y.o. male.   Patient well-known to the ED.  History of HIV, alcohol  abuse, bipolar disorder, PTSD, pancreatitis here with concern for alcohol  withdrawal.  He was seen yesterday for alcohol  intoxication.  He reports he felt he was having some withdrawals today and had 2 bottles of wine instead of his usual 5 bottles of wine.  Feels somewhat shaky and tremulous.  He is angry and agitated on arrival.  Has chronic abdominal pain which is unchanged.  Several episodes of vomiting today.  No black or bloody stools.  No fever.  No chest pain or shortness of breath.  Denies any other drug use.  The history is provided by the patient and the EMS personnel.  Alcohol  Problem Associated symptoms include abdominal pain. Pertinent negatives include no headaches and no shortness of breath.       Prior to Admission medications   Medication Sig Start Date End Date Taking? Authorizing Provider  bictegravir-emtricitabine -tenofovir  AF (BIKTARVY ) 50-200-25 MG TABS tablet Take 1 tablet by mouth daily. 02/24/24   Von Bellis, MD  chlordiazePOXIDE  (LIBRIUM ) 25 MG capsule 50mg  PO TID x 1D, then 25-50mg  PO BID X 1D, then 25-50mg  PO QD X 1D 07/13/24   Garrick Charleston, MD  folic acid  (FOLVITE ) 1 MG tablet Take 1 tablet (1 mg total) by mouth daily. 05/27/24   Amin, Sumayya, MD  gabapentin  (NEURONTIN ) 100 MG capsule Take 1 capsule (100 mg total) by mouth at bedtime. 06/28/24   Tobie Yetta HERO, MD  levETIRAcetam  (KEPPRA ) 500 MG tablet Take 1 tablet (500 mg total) by mouth 2 (two) times daily. 06/28/24   Tobie Yetta HERO, MD  lipase/protease/amylase (CREON ) 36000 UNITS CPEP capsule Take 1 capsule (36,000 Units total) by mouth 3 (three) times daily before meals. 05/26/24   Amin, Sumayya, MD  nicotine  (NICODERM CQ  - DOSED IN MG/24  HOURS) 14 mg/24hr patch Place 1 patch (14 mg total) onto the skin daily. 04/26/24   Bennett, Christal H, NP  oxyCODONE -acetaminophen  (PERCOCET) 10-325 MG tablet Take 1 tablet by mouth every 8 (eight) hours as needed for pain. 06/28/24 06/28/25  Tobie Yetta HERO, MD  pantoprazole  (PROTONIX ) 20 MG tablet Take 1 tablet (20 mg total) by mouth daily for 14 days. 07/13/24 07/27/24  Garrick Charleston, MD  pantoprazole  (PROTONIX ) 40 MG tablet Take 1 tablet (40 mg total) by mouth daily. 06/29/24   Tobie Yetta HERO, MD  sucralfate  (CARAFATE ) 1 g tablet Take 1 tablet (1 g total) by mouth 4 (four) times daily -  with meals and at bedtime. 07/07/24   Horton, Charmaine FALCON, MD  sucralfate  (CARAFATE ) 1 g tablet Take 1 tablet (1 g total) by mouth 4 (four) times daily -  with meals and at bedtime. 07/13/24   Garrick Charleston, MD  thiamine  (VITAMIN B1) 100 MG tablet Take 1 tablet (100 mg total) by mouth daily. 06/28/24   Tobie Yetta HERO, MD  famotidine  (PEPCID ) 20 MG tablet Take 1 tablet (20 mg total) by mouth 2 (two) times daily. Patient not taking: Reported on 06/01/2019 05/14/19 06/02/19  [name], MD    Allergies: Risperidone  and paliperidone, Tegretol  [carbamazepine ], and Caffeine    Review of Systems  Constitutional:  Negative for activity change, appetite change and fever.  HENT:  Negative for congestion.   Respiratory:  Negative for cough and shortness of breath.   Gastrointestinal:  Positive for abdominal pain, nausea and vomiting.  Genitourinary:  Negative for dysuria and hematuria.  Neurological:  Negative for dizziness, weakness and headaches.  Psychiatric/Behavioral:  Positive for decreased concentration. Negative for suicidal ideas. The patient is nervous/anxious.     all other systems are negative except as noted in the HPI and PMH.   Updated Vital Signs BP 137/70   Pulse (!) 101   Temp 98.4 F (36.9 C) (Oral)   Resp 18   Ht 6' (1.829 m)   Wt 68 kg   SpO2 96%   BMI 20.33 kg/m   Physical  Exam Vitals and nursing note reviewed.  Constitutional:      General: He is not in acute distress.    Appearance: He is well-developed. He is ill-appearing.     Comments:  chronic ill-appearing  HENT:     Head: Normocephalic and atraumatic.     Mouth/Throat:     Pharynx: No oropharyngeal exudate.  Eyes:     Conjunctiva/sclera: Conjunctivae normal.     Pupils: Pupils are equal, round, and reactive to light.  Neck:     Comments: No meningismus. Cardiovascular:     Rate and Rhythm: Regular rhythm. Tachycardia present.     Heart sounds: Normal heart sounds. No murmur heard. Pulmonary:     Effort: Pulmonary effort is normal. No respiratory distress.     Breath sounds: Normal breath sounds.  Abdominal:     Palpations: Abdomen is soft.     Tenderness: There is abdominal tenderness. There is no guarding or rebound.     Comments: Epigastric tenderness, no guarding or rebound  Musculoskeletal:        General: No tenderness. Normal range of motion.     Cervical back: Normal range of motion and neck supple.  Skin:    General: Skin is warm.  Neurological:     Mental Status: He is alert and oriented to person, place, and time.     Cranial Nerves: No cranial nerve deficit.     Motor: No abnormal muscle tone.     Coordination: Coordination normal.     Comments:  5/5 strength throughout. CN 2-12 intact.Equal grip strength Mildly tremulous  Psychiatric:        Behavior: Behavior normal.     (all labs ordered are listed, but only abnormal results are displayed) Labs Reviewed  CBC WITH DIFFERENTIAL/PLATELET - Abnormal; Notable for the following components:      Result Value   WBC 2.8 (*)    RBC 3.32 (*)    Hemoglobin 9.7 (*)    HCT 29.5 (*)    RDW 22.8 (*)    Platelets 52 (*)    Neutro Abs 1.0 (*)    All other components within normal limits  COMPREHENSIVE METABOLIC PANEL WITH GFR - Abnormal; Notable for the following components:   Glucose, Bld 122 (*)    Creatinine, Ser 0.58 (*)     Calcium  8.6 (*)    Total Protein 6.2 (*)    AST 87 (*)    ALT 45 (*)    All other components within normal limits  ETHANOL - Abnormal; Notable for the following components:   Alcohol , Ethyl (B) 407 (*)    All other components within normal limits  LIPASE, BLOOD - Abnormal; Notable for the following components:   Lipase 53 (*)    All other components within normal limits    EKG: EKG  Interpretation Date/Time:  Monday July 24 2024 00:44:21 EDT Ventricular Rate:  101 PR Interval:  140 QRS Duration:  93 QT Interval:  339 QTC Calculation: 440 R Axis:   76  Text Interpretation: Sinus tachycardia RSR' in V1 or V2, probably normal variant No significant change was found Confirmed by Carita Senior [id number]) on 07/24/2024 2:26:15 AM  Radiology: No results found.While he was making it 30 by just being asked all   Procedures   Medications Ordered in the ED  LORazepam  (ATIVAN ) tablet 1-4 mg (has no administration in time range)    Or  LORazepam  (ATIVAN ) injection 1-4 mg (has no administration in time range)  thiamine  (VITAMIN B1) tablet 100 mg (has no administration in time range)    Or  thiamine  (VITAMIN B1) injection 100 mg (has no administration in time range)  folic acid  (FOLVITE ) tablet 1 mg (has no administration in time range)  multivitamin with minerals tablet 1 tablet (has no administration in time range)  LORazepam  (ATIVAN ) injection 0-4 mg (has no administration in time range)    Followed by  LORazepam  (ATIVAN ) injection 0-4 mg (has no administration in time range)                                    Medical Decision Making Amount and/or Complexity of Data Reviewed Labs: ordered. Decision-making details documented in ED Course. Radiology: ordered and independent interpretation performed. Decision-making details documented in ED Course. ECG/medicine tests: ordered and independent interpretation performed. Decision-making details documented in ED  Course.  Risk OTC drugs. Prescription drug management.   Patient here with concern for alcohol  withdrawal.  Had 2 bottles of wine today instead of his normal 5.  He is mildly tachycardic and tremulous.  Initial CIWA score elevated at 30 but patient was agitated and likely embellishing this.  He was upset with nursing staff and threatening providers.  He was informed this behavior would not be tolerated.  He was given Ativan  and Haldol . IV fluids given.  Remains tachycardic in the 120s though resting comfortably.  CIWA has improved to 1. Labs with alcohol  intoxication of 407.  LFTs appear to be at baseline  Labs with worsening thrombocytopenia to 53.  Hemoglobin slightly decreased at 9.7.  He denies any black or bloody stools.  Patient will need to metabolize his alcohol .  Heart rate improving to 100.  Blood pressure remained stable 130s over 80s.  Abdomen soft without peritoneal signs.  Lipase minimally elevated.  Labs show slightly worsening of his pancytopenia.  He denies any recent bleeding.   Care Transferred at shift change.      Final diagnoses:  None    ED Discharge Orders     None          Neldon Shepard, Senior, MD 07/24/24 [phone]

## 2024-07-24 NOTE — ED Notes (Signed)
 Patient was changed into gown     patient has urinated all over himself and in the bed   linens were changed as well   patient is still very groggy and as to have help in getting out of bed in order to get the bed changed

## 2024-07-24 NOTE — ED Notes (Addendum)
 Pt stated:  I feel like having an anxiety attack and I'm angry

## 2024-07-24 NOTE — ED Notes (Signed)
 CIWA was off from schedule due to conflict in documentation from when the last one was done and when last dose of ativan  was give

## 2024-07-24 NOTE — ED Triage Notes (Addendum)
 Pt arrives via ems for alcohol  intoxication. States that he had 2 bottles of wine to keep his seizures away. C/o chronic abdominal pain from pancreatitis.

## 2024-07-24 NOTE — ED Provider Notes (Signed)
 Received patient in turnover from Dr. Carita.  Please see their note for further details of Hx, PE.  Briefly patient is a 38 y.o. male with a Alcohol  Problem .  Plan to metabolize and reassess.  Patient observed for 12 hours and 45 minutes in the ER.  He is now awake and alert and ambulating.  Will discharge home.  PCP follow-up.   Emil Share, DO 07/24/24 1311

## 2024-07-24 NOTE — ED Notes (Signed)
 In triage pt is stating he does not want to get angry, but he is about to get angry. Pt balling fists up

## 2024-07-25 NOTE — ED Provider Notes (Signed)
 [place] EMERGENCY DEPARTMENT AT [facility] Provider Note  CSN: [id number] Arrival date & time: 07/24/24 2052  Chief Complaint(s) Alcohol  Intoxication  HPI Gary [name] is a 38 y.o. male     Alcohol  Intoxication This is a chronic problem. The problem occurs daily. The problem has not changed since onset.Pertinent negatives include no chest pain, no abdominal pain, no headaches and no shortness of breath. Nothing aggravates the symptoms. Nothing relieves the symptoms.    Past Medical History Past Medical History:  Diagnosis Date   Alcohol  abuse    Alcohol -induced pancreatitis 04/16/2022   Anxiety    Bipolar 2 disorder (HCC)    HIV (human immunodeficiency virus infection) (HCC)    Pancreatitis    PTSD (post-traumatic stress disorder)    Schizophrenia (HCC)    Seizures (HCC)    Subdural hematoma (HCC)    Patient Active Problem List   Diagnosis Date Noted   Acute pancreatitis 06/23/2024   Depression with anxiety 05/21/2024   GERD (gastroesophageal reflux disease) 05/21/2024   Hypercalcemia 05/21/2024   Dehydration 05/19/2024   Heat exposure 05/19/2024   Epigastric pain 05/09/2024   SIRS (systemic inflammatory response syndrome) (HCC) 05/09/2024   Bipolar 2 disorder (HCC) 05/09/2024   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   Brief psychotic disorder (HCC) 04/20/2024   Hypotension 03/31/2024   High risk for readmission 03/01/2024   Alcohol  withdrawal (HCC) 02/25/2024   Severe protein-calorie malnutrition (HCC) 02/25/2024   Alcohol  induced acute pancreatitis 02/14/2024   Pancreatic lesion 02/13/2024   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/2024   Ileus (HCC) 12/25/2023   PTSD (post-traumatic stress disorder) 12/25/2023   Anxiety state 12/20/2023   Pancreatitis, necrotizing 12/17/2023   Tobacco use disorder 12/17/2023   Acute alcoholic pancreatitis 12/10/2023   Acute on chronic anemia 12/05/2023   Seizure (HCC) 11/19/2023   Gastritis and duodenitis  11/19/2023   Nausea and vomiting 11/01/2023   Chronic pancreatitis (HCC) 10/16/2023   Acute on chronic pancreatitis (HCC) 10/16/2023   Alcoholic fatty liver 09/25/2023   Hypomagnesemia 12/24/2022   Seizure disorder (HCC) 12/17/2022   Pancytopenia (HCC) 12/14/2022   Alcohol -induced pancreatitis 12/14/2022   Delirium tremens (HCC) 10/19/2022   Alcohol  dependence with withdrawal (HCC) 10/16/2022   Homelessness 05/28/2022   Metabolic acidosis, increased anion gap 04/16/2022   Hypokalemia 01/22/2022   Constipation 07/30/2021   Alcohol  abuse    Malingering 05/15/2020   Alcoholic intoxication without complication (HCC) 07/02/2019   Alcohol -induced mood disorder (HCC) 05/13/2019   Alcohol  use disorder, severe, dependence (HCC) 04/16/2019   Severe episode of recurrent major depressive disorder, without psychotic features (HCC) 04/16/2019   HIV (human immunodeficiency virus infection) (HCC) 06/16/2018   Polysubstance abuse (HCC) 06/16/2018   Nicotine  dependence, cigarettes, uncomplicated 06/16/2018   Home Medication(s) Prior to Admission medications   Medication Sig Start Date End Date Taking? Authorizing Provider  bictegravir-emtricitabine -tenofovir  AF (BIKTARVY ) 50-200-25 MG TABS tablet Take 1 tablet by mouth daily. 02/24/24   Von Bellis, MD  chlordiazePOXIDE  (LIBRIUM ) 25 MG capsule 50mg  PO TID x 1D, then 25-50mg  PO BID X 1D, then 25-50mg  PO QD X 1D 07/13/24   Garrick Charleston, MD  folic acid  (FOLVITE ) 1 MG tablet Take 1 tablet (1 mg total) by mouth daily. 05/27/24   Amin, Sumayya, MD  gabapentin  (NEURONTIN ) 100 MG capsule Take 1 capsule (100 mg total) by mouth at bedtime. 06/28/24   Tobie Yetta HERO, MD  levETIRAcetam  (KEPPRA ) 500 MG tablet Take 1 tablet (500 mg total) by mouth 2 (two) times  daily. 06/28/24   Tobie Yetta HERO, MD  lipase/protease/amylase (CREON ) 36000 UNITS CPEP capsule Take 1 capsule (36,000 Units total) by mouth 3 (three) times daily before meals. 05/26/24   Amin, Sumayya, MD   nicotine  (NICODERM CQ  - DOSED IN MG/24 HOURS) 14 mg/24hr patch Place 1 patch (14 mg total) onto the skin daily. 04/26/24   Bennett, Christal H, NP  oxyCODONE -acetaminophen  (PERCOCET) 10-325 MG tablet Take 1 tablet by mouth every 8 (eight) hours as needed for pain. 06/28/24 06/28/25  Tobie Yetta HERO, MD  pantoprazole  (PROTONIX ) 20 MG tablet Take 1 tablet (20 mg total) by mouth daily for 14 days. 07/13/24 07/27/24  Garrick Charleston, MD  pantoprazole  (PROTONIX ) 40 MG tablet Take 1 tablet (40 mg total) by mouth daily. 06/29/24   Tobie Yetta HERO, MD  sucralfate  (CARAFATE ) 1 g tablet Take 1 tablet (1 g total) by mouth 4 (four) times daily -  with meals and at bedtime. 07/07/24   Horton, Charmaine FALCON, MD  sucralfate  (CARAFATE ) 1 g tablet Take 1 tablet (1 g total) by mouth 4 (four) times daily -  with meals and at bedtime. 07/13/24   Garrick Charleston, MD  thiamine  (VITAMIN B1) 100 MG tablet Take 1 tablet (100 mg total) by mouth daily. 06/28/24   Tobie Yetta HERO, MD  famotidine  (PEPCID ) 20 MG tablet Take 1 tablet (20 mg total) by mouth 2 (two) times daily. Patient not taking: Reported on 06/01/2019 05/14/19 06/02/19  [name], MD                                                                                                                                    Allergies Risperidone  and paliperidone, Tegretol  [carbamazepine ], and Caffeine  Review of Systems Review of Systems  Respiratory:  Negative for shortness of breath.   Cardiovascular:  Negative for chest pain.  Gastrointestinal:  Negative for abdominal pain.  Neurological:  Negative for headaches.   As noted in HPI  Physical Exam Vital Signs  I have reviewed the triage vital signs BP 109/69   Pulse 71   Temp 98.5 F (36.9 C) (Oral)   Resp 15   SpO2 93%   Physical Exam Vitals reviewed.  Constitutional:      General: He is not in acute distress.    Appearance: He is well-developed. He is not diaphoretic.  HENT:     Head: Normocephalic and  atraumatic.     Right Ear: External ear normal.     Left Ear: External ear normal.     Nose: Nose normal.     Mouth/Throat:     Mouth: Mucous membranes are moist.  Eyes:     General: No scleral icterus.    Conjunctiva/sclera: Conjunctivae normal.  Neck:     Trachea: Phonation normal.  Cardiovascular:     Rate and Rhythm: Normal rate and regular rhythm.  Pulmonary:     Effort: Pulmonary effort is normal.  No respiratory distress.     Breath sounds: No stridor.  Abdominal:     General: There is no distension.  Musculoskeletal:        General: Normal range of motion.     Cervical back: Normal range of motion.  Neurological:     Mental Status: He is alert and oriented to person, place, and time.  Psychiatric:        Behavior: Behavior normal.     ED Results and Treatments Labs (all labs ordered are listed, but only abnormal results are displayed) Labs Reviewed  COMPREHENSIVE METABOLIC PANEL WITH GFR - Abnormal; Notable for the following components:      Result Value   BUN <5 (*)    Creatinine, Ser 0.44 (*)    Calcium  8.7 (*)    AST 85 (*)    ALT 47 (*)    All other components within normal limits  ETHANOL - Abnormal; Notable for the following components:   Alcohol , Ethyl (B) 397 (*)    All other components within normal limits  CBC WITH DIFFERENTIAL/PLATELET - Abnormal; Notable for the following components:   WBC 3.5 (*)    RBC 3.88 (*)    Hemoglobin 11.0 (*)    HCT 33.9 (*)    RDW 22.8 (*)    Platelets 57 (*)    All other components within normal limits  LIPASE, BLOOD                                                                                                                         EKG  EKG Interpretation Date/Time:    Ventricular Rate:    PR Interval:    QRS Duration:    QT Interval:    QTC Calculation:   R Axis:      Text Interpretation:         Radiology No results found.  Medications Ordered in ED Medications - No data to  display Procedures Procedures  (including critical care time) Medical Decision Making / ED Course   Medical Decision Making   Patient presents for alcohol  intoxication confirm with the level of 397.  Other labs are at baseline and grossly reassuring.  Patient allowed to metabolize to freedom.  6:45 AM Appears clinically sober.      Final Clinical Impression(s) / ED Diagnoses Final diagnoses:  Alcoholic intoxication without complication (HCC)   The patient appears reasonably screened and/or stabilized for discharge and I doubt any other medical condition or other [facility] requiring further screening, evaluation, or treatment in the ED at this time. I have discussed the findings, Dx and Tx plan with the patient/family who expressed understanding and agree(s) with the plan. Discharge instructions discussed at length. The patient/family was given strict return precautions who verbalized understanding of the instructions. No further questions at time of discharge.  Disposition: Discharge  Condition: Good  ED Discharge Orders     None       Follow Up: [facility]  [facility] Address: [address] [place], [place] [address] Hours: Open 24 hours Monday through Sunday Phone: [phone] Go to  as needed    This chart was dictated using voice recognition software.  Despite best efforts to proofread,  errors can occur which can change the documentation meaning.    Trine Raynell Moder, MD 07/25/24 [phone]

## 2024-07-31 ENCOUNTER — Inpatient Hospital Stay (HOSPITAL_COMMUNITY)
Admission: EM | Admit: 2024-07-31 | Discharge: 2024-08-03 | DRG: 897 | Disposition: A | Discharge status: Home / Self Care | Payer: MEDICAID | Attending: Infectious Diseases | Admitting: Infectious Diseases

## 2024-07-31 ENCOUNTER — Emergency Department (HOSPITAL_COMMUNITY): Payer: MEDICAID

## 2024-07-31 DIAGNOSIS — Z560 Unemployment, unspecified: Secondary | ICD-10-CM

## 2024-07-31 DIAGNOSIS — Y908 Blood alcohol level of 240 mg/100 ml or more: Secondary | ICD-10-CM | POA: Diagnosis present

## 2024-07-31 DIAGNOSIS — Z79899 Other long term (current) drug therapy: Secondary | ICD-10-CM

## 2024-07-31 DIAGNOSIS — B20 Human immunodeficiency virus [HIV] disease: Secondary | ICD-10-CM | POA: Diagnosis present

## 2024-07-31 DIAGNOSIS — F10921 Alcohol use, unspecified with intoxication delirium: Secondary | ICD-10-CM | POA: Diagnosis present

## 2024-07-31 DIAGNOSIS — R443 Hallucinations, unspecified: Secondary | ICD-10-CM

## 2024-07-31 DIAGNOSIS — K861 Other chronic pancreatitis: Secondary | ICD-10-CM | POA: Diagnosis present

## 2024-07-31 DIAGNOSIS — E876 Hypokalemia: Secondary | ICD-10-CM | POA: Diagnosis present

## 2024-07-31 DIAGNOSIS — E46 Unspecified protein-calorie malnutrition: Secondary | ICD-10-CM | POA: Diagnosis present

## 2024-07-31 DIAGNOSIS — D709 Neutropenia, unspecified: Secondary | ICD-10-CM | POA: Diagnosis present

## 2024-07-31 DIAGNOSIS — Z21 Asymptomatic human immunodeficiency virus [HIV] infection status: Secondary | ICD-10-CM | POA: Diagnosis present

## 2024-07-31 DIAGNOSIS — F411 Generalized anxiety disorder: Secondary | ICD-10-CM | POA: Diagnosis present

## 2024-07-31 DIAGNOSIS — F10239 Alcohol dependence with withdrawal, unspecified: Principal | ICD-10-CM | POA: Diagnosis present

## 2024-07-31 DIAGNOSIS — Z800 Family history of malignant neoplasm of digestive organs: Secondary | ICD-10-CM

## 2024-07-31 DIAGNOSIS — F332 Major depressive disorder, recurrent severe without psychotic features: Secondary | ICD-10-CM | POA: Diagnosis present

## 2024-07-31 DIAGNOSIS — K860 Alcohol-induced chronic pancreatitis: Secondary | ICD-10-CM | POA: Diagnosis present

## 2024-07-31 DIAGNOSIS — F1020 Alcohol dependence, uncomplicated: Secondary | ICD-10-CM | POA: Diagnosis present

## 2024-07-31 DIAGNOSIS — G40909 Epilepsy, unspecified, not intractable, without status epilepticus: Secondary | ICD-10-CM | POA: Diagnosis present

## 2024-07-31 DIAGNOSIS — K7030 Alcoholic cirrhosis of liver without ascites: Secondary | ICD-10-CM | POA: Diagnosis present

## 2024-07-31 DIAGNOSIS — F209 Schizophrenia, unspecified: Secondary | ICD-10-CM | POA: Diagnosis present

## 2024-07-31 DIAGNOSIS — E8809 Other disorders of plasma-protein metabolism, not elsewhere classified: Secondary | ICD-10-CM | POA: Diagnosis present

## 2024-07-31 DIAGNOSIS — Z811 Family history of alcohol abuse and dependence: Secondary | ICD-10-CM

## 2024-07-31 DIAGNOSIS — E871 Hypo-osmolality and hyponatremia: Secondary | ICD-10-CM | POA: Diagnosis present

## 2024-07-31 DIAGNOSIS — F10221 Alcohol dependence with intoxication delirium: Secondary | ICD-10-CM | POA: Diagnosis present

## 2024-07-31 DIAGNOSIS — R45850 Homicidal ideations: Principal | ICD-10-CM

## 2024-07-31 DIAGNOSIS — R64 Cachexia: Secondary | ICD-10-CM | POA: Diagnosis present

## 2024-07-31 DIAGNOSIS — Z9152 Personal history of nonsuicidal self-harm: Secondary | ICD-10-CM

## 2024-07-31 DIAGNOSIS — K219 Gastro-esophageal reflux disease without esophagitis: Secondary | ICD-10-CM | POA: Diagnosis present

## 2024-07-31 DIAGNOSIS — F10939 Alcohol use, unspecified with withdrawal, unspecified: Secondary | ICD-10-CM | POA: Diagnosis present

## 2024-07-31 DIAGNOSIS — Z8249 Family history of ischemic heart disease and other diseases of the circulatory system: Secondary | ICD-10-CM

## 2024-07-31 DIAGNOSIS — Z5902 Unsheltered homelessness: Secondary | ICD-10-CM

## 2024-07-31 DIAGNOSIS — F17210 Nicotine dependence, cigarettes, uncomplicated: Secondary | ICD-10-CM | POA: Diagnosis present

## 2024-07-31 DIAGNOSIS — D61818 Other pancytopenia: Secondary | ICD-10-CM | POA: Diagnosis present

## 2024-07-31 LAB — CBC WITH DIFFERENTIAL/PLATELET
Abs Immature Granulocytes: 0 K/uL (ref 0.00–0.07)
Basophils Absolute: 0 K/uL (ref 0.0–0.1)
Basophils Relative: 2 %
Eosinophils Absolute: 0.1 K/uL (ref 0.0–0.5)
Eosinophils Relative: 3 %
HCT: 34.1 % — ABNORMAL LOW (ref 39.0–52.0)
Hemoglobin: 11.2 g/dL — ABNORMAL LOW (ref 13.0–17.0)
Immature Granulocytes: 0 %
Lymphocytes Relative: 54 %
Lymphs Abs: 1 K/uL (ref 0.7–4.0)
MCH: 29.6 pg (ref 26.0–34.0)
MCHC: 32.8 g/dL (ref 30.0–36.0)
MCV: 90 fL (ref 80.0–100.0)
Monocytes Absolute: 0.2 K/uL (ref 0.1–1.0)
Monocytes Relative: 11 %
Neutro Abs: 0.5 K/uL — ABNORMAL LOW (ref 1.7–7.7)
Neutrophils Relative %: 30 %
Platelets: 82 K/uL — ABNORMAL LOW (ref 150–400)
RBC: 3.79 MIL/uL — ABNORMAL LOW (ref 4.22–5.81)
RDW: 22.6 % — ABNORMAL HIGH (ref 11.5–15.5)
Smear Review: NORMAL
WBC: 1.8 K/uL — ABNORMAL LOW (ref 4.0–10.5)
nRBC: 0 % (ref 0.0–0.2)

## 2024-07-31 LAB — COMPREHENSIVE METABOLIC PANEL WITH GFR
ALT: 33 U/L (ref 0–44)
AST: 51 U/L — ABNORMAL HIGH (ref 15–41)
Albumin: 3.5 g/dL (ref 3.5–5.0)
Alkaline Phosphatase: 54 U/L (ref 38–126)
Anion gap: 13 (ref 5–15)
BUN: <5 mg/dL — ABNORMAL LOW (ref 6–20)
CO2: 26 mmol/L (ref 22–32)
Calcium: 7.9 mg/dL — ABNORMAL LOW (ref 8.9–10.3)
Chloride: 101 mmol/L (ref 98–111)
Creatinine, Ser: 0.42 mg/dL — ABNORMAL LOW (ref 0.61–1.24)
GFR, Estimated: >60 mL/min (ref >60)
Glucose, Bld: 94 mg/dL (ref 70–99)
Potassium: 3.4 mmol/L — ABNORMAL LOW (ref 3.5–5.1)
Sodium: 140 mmol/L (ref 135–145)
Total Bilirubin: 0.4 mg/dL (ref 0.0–1.2)
Total Protein: 6.5 g/dL (ref 6.5–8.1)

## 2024-07-31 LAB — ETHANOL: Alcohol, Ethyl (B): 378 mg/dL (ref <15)

## 2024-07-31 LAB — LIPASE, BLOOD: Lipase: 35 U/L (ref 11–51)

## 2024-07-31 LAB — TROPONIN T, HIGH SENSITIVITY: Troponin T High Sensitivity: <15 ng/L (ref 0–19)

## 2024-07-31 LAB — BRAIN NATRIURETIC PEPTIDE: B Natriuretic Peptide: 16.9 pg/mL (ref 0.0–100.0)

## 2024-07-31 LAB — TROPONIN I (HIGH SENSITIVITY): Troponin I (High Sensitivity): 6 ng/L (ref <18)

## 2024-07-31 MED ORDER — LORAZEPAM 2 MG/ML IJ SOLN
1.0000 mg | INTRAMUSCULAR | Status: DC | PRN
  Administered 2024-08-01 (×2): 2 mg via INTRAVENOUS
  Filled 2024-07-31 (×2): qty 1

## 2024-07-31 MED ORDER — ALUM & MAG HYDROXIDE-SIMETH 200-200-20 MG/5ML PO SUSP
30.0000 mL | Freq: Once | ORAL | Status: AC
  Administered 2024-07-31: 30 mL via ORAL
  Filled 2024-07-31: qty 30

## 2024-07-31 MED ORDER — THIAMINE HCL 100 MG/ML IJ SOLN: 100.0000 mg | Freq: Every day | INTRAMUSCULAR | Status: DC

## 2024-07-31 MED ORDER — ADULT MULTIVITAMIN W/MINERALS CH
1.0000 | ORAL_TABLET | Freq: Every day | ORAL | Status: DC
  Administered 2024-07-31 – 2024-08-03 (×4): 1 via ORAL
  Filled 2024-07-31 (×5): qty 1

## 2024-07-31 MED ORDER — LACTATED RINGERS IV BOLUS
1000.0000 mL | Freq: Once | INTRAVENOUS | Status: AC
  Administered 2024-07-31: 1000 mL via INTRAVENOUS

## 2024-07-31 MED ORDER — FAMOTIDINE IN NACL 20-0.9 MG/50ML-% IV SOLN
20.0000 mg | Freq: Once | INTRAVENOUS | Status: AC
  Administered 2024-07-31: 20 mg via INTRAVENOUS
  Filled 2024-07-31: qty 50

## 2024-07-31 MED ORDER — MAGNESIUM SULFATE 2 GM/50ML IV SOLN
2.0000 g | Freq: Once | INTRAVENOUS | Status: AC
  Administered 2024-07-31: 2 g via INTRAVENOUS
  Filled 2024-07-31: qty 50

## 2024-07-31 MED ORDER — FENTANYL CITRATE PF 50 MCG/ML IJ SOSY
25.0000 ug | PREFILLED_SYRINGE | Freq: Once | INTRAMUSCULAR | Status: AC
Start: 2024-07-31 — End: 2024-07-31
  Administered 2024-07-31: 25 ug via INTRAVENOUS
  Filled 2024-07-31 (×2): qty 1

## 2024-07-31 MED ORDER — THIAMINE MONONITRATE 100 MG PO TABS
100.0000 mg | ORAL_TABLET | Freq: Every day | ORAL | Status: DC
  Administered 2024-07-31 – 2024-08-01 (×2): 100 mg via ORAL
  Filled 2024-07-31 (×2): qty 1

## 2024-07-31 MED ORDER — FOLIC ACID 1 MG PO TABS
1.0000 mg | ORAL_TABLET | Freq: Every day | ORAL | Status: DC
  Administered 2024-07-31 – 2024-08-01 (×2): 1 mg via ORAL
  Filled 2024-07-31 (×2): qty 1

## 2024-07-31 MED ORDER — PANTOPRAZOLE SODIUM 40 MG IV SOLR
40.0000 mg | Freq: Once | INTRAVENOUS | Status: AC
  Administered 2024-07-31: 40 mg via INTRAVENOUS
  Filled 2024-07-31: qty 10

## 2024-07-31 MED ORDER — LORAZEPAM 1 MG PO TABS
1.0000 mg | ORAL_TABLET | ORAL | Status: DC | PRN
  Administered 2024-07-31: 3 mg via ORAL
  Administered 2024-08-01: 2 mg via ORAL
  Filled 2024-07-31: qty 2
  Filled 2024-07-31: qty 3
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
## 2024-07-31 NOTE — ED Provider Triage Note (Cosign Needed Addendum)
 Emergency Medicine Provider Triage Evaluation Note  ANTOINNE [name] , a 38 y.o. male  was evaluated in triage.  Pt complains of alcoholism, states that he wants triage.  Reports severe anxiety.  He was states he has been vomiting all morning and tried to drink it off.  Last drink was 1 hour ago.  Patient with frequent ED visits.  Review of Systems  Positive: Vomiting Negative: Tremors, altered mental status  Physical Exam  There were no vitals taken for this visit. Gen:   Awake, no distress   Resp:  Normal effort  MSK:   Moves extremities without difficulty  Other:  Argumentative  Medical Decision Making  Medically screening exam initiated at 12:19 PM.  Appropriate orders placed.  Gary [name] was informed that the remainder of the evaluation will be completed by another provider, this initial triage assessment does not replace that evaluation, and the importance of remaining in the ED until their evaluation is complete.  Currently patient appears stable, no signs of active alcohol  withdrawal or altered mental status, appears consistent with most recent ED visits.  Labs from 1 week ago without unexpected abnormality.   [name], PA-C 07/31/24 1221

## 2024-07-31 NOTE — BH Assessment (Addendum)
 This patient has been referred to [facility] telecare for his teleassessment.  A coordinator with [facility] will reach out with the time and provider to see patient.

## 2024-07-31 NOTE — ED Triage Notes (Signed)
 [facility] [place] for [place]  intoxication with SI/HI, hx of schizophrenia, pancreatitis. Patient reports he drank 3 bottles of wine this morning, is scared he will hurt everyone when the alcohol  wears off and himself and he doesn't want to, also hx of seizures. Patient reports significant abdominal pain in his pancreas.  BP 120/60 HR 112 RR 20 100% RA CBG 135

## 2024-07-31 NOTE — ED Notes (Signed)
 Per nursing team, pt demanded US  IV but would not allow RN to start it where she saw vessels..  he declined IV when she could not place it where he wanted it.

## 2024-07-31 NOTE — Progress Notes (Signed)
 Patient is pending labs and possible IVC. Medical work-up in progress. Disposition is pending.

## 2024-08-01 DIAGNOSIS — Y908 Blood alcohol level of 240 mg/100 ml or more: Secondary | ICD-10-CM | POA: Diagnosis present

## 2024-08-01 DIAGNOSIS — F411 Generalized anxiety disorder: Secondary | ICD-10-CM

## 2024-08-01 DIAGNOSIS — G40909 Epilepsy, unspecified, not intractable, without status epilepticus: Secondary | ICD-10-CM

## 2024-08-01 DIAGNOSIS — E871 Hypo-osmolality and hyponatremia: Secondary | ICD-10-CM | POA: Diagnosis present

## 2024-08-01 DIAGNOSIS — E876 Hypokalemia: Secondary | ICD-10-CM | POA: Diagnosis present

## 2024-08-01 DIAGNOSIS — F10921 Alcohol use, unspecified with intoxication delirium: Secondary | ICD-10-CM

## 2024-08-01 DIAGNOSIS — R64 Cachexia: Secondary | ICD-10-CM | POA: Diagnosis present

## 2024-08-01 DIAGNOSIS — F17210 Nicotine dependence, cigarettes, uncomplicated: Secondary | ICD-10-CM | POA: Diagnosis present

## 2024-08-01 DIAGNOSIS — E8809 Other disorders of plasma-protein metabolism, not elsewhere classified: Secondary | ICD-10-CM | POA: Diagnosis present

## 2024-08-01 DIAGNOSIS — Z9152 Personal history of nonsuicidal self-harm: Secondary | ICD-10-CM | POA: Diagnosis not present

## 2024-08-01 DIAGNOSIS — F1020 Alcohol dependence, uncomplicated: Secondary | ICD-10-CM | POA: Diagnosis not present

## 2024-08-01 DIAGNOSIS — K7030 Alcoholic cirrhosis of liver without ascites: Secondary | ICD-10-CM | POA: Diagnosis present

## 2024-08-01 DIAGNOSIS — F10221 Alcohol dependence with intoxication delirium: Secondary | ICD-10-CM | POA: Diagnosis present

## 2024-08-01 DIAGNOSIS — F209 Schizophrenia, unspecified: Secondary | ICD-10-CM | POA: Diagnosis present

## 2024-08-01 DIAGNOSIS — Z79899 Other long term (current) drug therapy: Secondary | ICD-10-CM | POA: Diagnosis not present

## 2024-08-01 DIAGNOSIS — B20 Human immunodeficiency virus [HIV] disease: Secondary | ICD-10-CM | POA: Diagnosis present

## 2024-08-01 DIAGNOSIS — Z800 Family history of malignant neoplasm of digestive organs: Secondary | ICD-10-CM | POA: Diagnosis not present

## 2024-08-01 DIAGNOSIS — D709 Neutropenia, unspecified: Secondary | ICD-10-CM | POA: Diagnosis present

## 2024-08-01 DIAGNOSIS — K219 Gastro-esophageal reflux disease without esophagitis: Secondary | ICD-10-CM | POA: Diagnosis present

## 2024-08-01 DIAGNOSIS — Z8249 Family history of ischemic heart disease and other diseases of the circulatory system: Secondary | ICD-10-CM | POA: Diagnosis not present

## 2024-08-01 DIAGNOSIS — Z5902 Unsheltered homelessness: Secondary | ICD-10-CM | POA: Diagnosis not present

## 2024-08-01 DIAGNOSIS — Z560 Unemployment, unspecified: Secondary | ICD-10-CM | POA: Diagnosis not present

## 2024-08-01 DIAGNOSIS — E46 Unspecified protein-calorie malnutrition: Secondary | ICD-10-CM | POA: Diagnosis present

## 2024-08-01 DIAGNOSIS — K860 Alcohol-induced chronic pancreatitis: Secondary | ICD-10-CM | POA: Diagnosis present

## 2024-08-01 DIAGNOSIS — F10131 Alcohol abuse with withdrawal delirium: Secondary | ICD-10-CM | POA: Diagnosis not present

## 2024-08-01 DIAGNOSIS — Z811 Family history of alcohol abuse and dependence: Secondary | ICD-10-CM | POA: Diagnosis not present

## 2024-08-01 DIAGNOSIS — F10239 Alcohol dependence with withdrawal, unspecified: Secondary | ICD-10-CM | POA: Diagnosis present

## 2024-08-01 DIAGNOSIS — F322 Major depressive disorder, single episode, severe without psychotic features: Secondary | ICD-10-CM

## 2024-08-01 MED ORDER — PHENOBARBITAL SODIUM 130 MG/ML IJ SOLN
230.0000 mg | INTRAMUSCULAR | Status: AC
  Administered 2024-08-01: 230 mg via INTRAVENOUS
  Filled 2024-08-01: qty 1.77

## 2024-08-01 MED ORDER — FOLIC ACID 1 MG PO TABS
1.0000 mg | ORAL_TABLET | Freq: Every day | ORAL | Status: DC
  Administered 2024-08-02 – 2024-08-03 (×2): 1 mg via ORAL
  Filled 2024-08-01 (×2): qty 1

## 2024-08-01 MED ORDER — LORAZEPAM 1 MG PO TABS
1.0000 mg | ORAL_TABLET | ORAL | Status: DC | PRN
  Administered 2024-08-02: 4 mg via ORAL
  Administered 2024-08-02 (×3): 1 mg via ORAL
  Filled 2024-08-01 (×3): qty 1
  Filled 2024-08-01: qty 3
  Filled 2024-08-01: qty 4

## 2024-08-01 MED ORDER — LORAZEPAM 1 MG PO TABS
1.0000 mg | ORAL_TABLET | Freq: Four times a day (QID) | ORAL | Status: DC | PRN
  Administered 2024-08-01: 1 mg via ORAL
  Filled 2024-08-01: qty 1

## 2024-08-01 MED ORDER — LEVETIRACETAM 500 MG PO TABS
500.0000 mg | ORAL_TABLET | Freq: Two times a day (BID) | ORAL | Status: DC
  Administered 2024-08-01 – 2024-08-03 (×5): 500 mg via ORAL
  Filled 2024-08-01 (×5): qty 1

## 2024-08-01 MED ORDER — PHENOBARBITAL SODIUM 130 MG/ML IJ SOLN
130.0000 mg | INTRAMUSCULAR | Status: AC
  Administered 2024-08-01: 130 mg via INTRAVENOUS
  Filled 2024-08-01: qty 1

## 2024-08-01 MED ORDER — PHENOBARBITAL 32.4 MG PO TABS
64.8000 mg | ORAL_TABLET | Freq: Three times a day (TID) | ORAL | Status: DC
  Administered 2024-08-03: 64.8 mg via ORAL
  Filled 2024-08-01: qty 2

## 2024-08-01 MED ORDER — LIDOCAINE 5 % EX PTCH
1.0000 | MEDICATED_PATCH | CUTANEOUS | Status: DC
  Administered 2024-08-01 – 2024-08-02 (×2): 1 via TRANSDERMAL
  Filled 2024-08-01 (×2): qty 1

## 2024-08-01 MED ORDER — BICTEGRAVIR-EMTRICITAB-TENOFOV 50-200-25 MG PO TABS
1.0000 | ORAL_TABLET | Freq: Every day | ORAL | Status: DC
  Administered 2024-08-01 – 2024-08-02 (×2): 1 via ORAL
  Filled 2024-08-01 (×4): qty 1

## 2024-08-01 MED ORDER — LORAZEPAM 2 MG/ML IJ SOLN: 1.0000 mg | INTRAMUSCULAR | Status: DC | PRN

## 2024-08-01 MED ORDER — GABAPENTIN 100 MG PO CAPS
100.0000 mg | ORAL_CAPSULE | Freq: Three times a day (TID) | ORAL | Status: DC
Start: 2024-08-01 — End: 2024-08-03
  Administered 2024-08-01 – 2024-08-03 (×6): 100 mg via ORAL
  Filled 2024-08-01 (×6): qty 1

## 2024-08-01 MED ORDER — ONDANSETRON HCL 4 MG PO TABS
4.0000 mg | ORAL_TABLET | Freq: Three times a day (TID) | ORAL | Status: DC | PRN
  Administered 2024-08-01 – 2024-08-03 (×2): 4 mg via ORAL
  Filled 2024-08-01 (×2): qty 1

## 2024-08-01 MED ORDER — ACETAMINOPHEN 325 MG PO TABS
650.0000 mg | ORAL_TABLET | Freq: Four times a day (QID) | ORAL | Status: DC | PRN
  Administered 2024-08-01 – 2024-08-02 (×2): 650 mg via ORAL
  Filled 2024-08-01 (×2): qty 2

## 2024-08-01 MED ORDER — HYDROXYZINE HCL 50 MG PO TABS
100.0000 mg | ORAL_TABLET | Freq: Once | ORAL | Status: AC
  Administered 2024-08-01: 100 mg via ORAL
  Filled 2024-08-01: qty 2

## 2024-08-01 MED ORDER — LORAZEPAM 2 MG/ML IJ SOLN
1.0000 mg | INTRAMUSCULAR | Status: DC | PRN
  Administered 2024-08-03: 2 mg via INTRAVENOUS
  Filled 2024-08-01 (×2): qty 1

## 2024-08-01 MED ORDER — THIAMINE HCL 100 MG/ML IJ SOLN
500.0000 mg | Freq: Three times a day (TID) | INTRAVENOUS | Status: DC
  Administered 2024-08-01 (×2): 500 mg via INTRAVENOUS
  Filled 2024-08-01 (×4): qty 5
  Filled 2024-08-01: qty 500
  Filled 2024-08-01 (×2): qty 5

## 2024-08-01 MED ORDER — PANTOPRAZOLE SODIUM 40 MG PO TBEC
40.0000 mg | DELAYED_RELEASE_TABLET | Freq: Every day | ORAL | Status: DC
  Administered 2024-08-01 – 2024-08-03 (×3): 40 mg via ORAL
  Filled 2024-08-01 (×3): qty 1

## 2024-08-01 MED ORDER — PHENOBARBITAL 32.4 MG PO TABS
97.2000 mg | ORAL_TABLET | Freq: Three times a day (TID) | ORAL | Status: AC
  Administered 2024-08-01 – 2024-08-02 (×5): 97.2 mg via ORAL
  Filled 2024-08-01 (×5): qty 3

## 2024-08-01 MED ORDER — PHENOBARBITAL 32.4 MG PO TABS
32.4000 mg | ORAL_TABLET | Freq: Three times a day (TID) | ORAL | Status: DC
Start: 2024-08-05 — End: 2024-08-07

## 2024-08-01 MED ORDER — LORAZEPAM 1 MG PO TABS: 1.0000 mg | ORAL_TABLET | ORAL | Status: DC | PRN

## 2024-08-01 MED ORDER — ENOXAPARIN SODIUM 40 MG/0.4ML IJ SOSY: 40.0000 mg | PREFILLED_SYRINGE | INTRAMUSCULAR | Status: DC

## 2024-08-01 MED ORDER — HYDROXYZINE HCL 50 MG PO TABS
100.0000 mg | ORAL_TABLET | Freq: Four times a day (QID) | ORAL | Status: DC | PRN
Start: 2024-08-01 — End: 2024-08-01

## 2024-08-01 MED ORDER — OXYCODONE HCL 5 MG PO TABS
5.0000 mg | ORAL_TABLET | Freq: Once | ORAL | Status: AC
  Administered 2024-08-01: 5 mg via ORAL
  Filled 2024-08-01: qty 1

## 2024-08-01 MED ORDER — DICLOFENAC SODIUM 1 % EX GEL
4.0000 g | Freq: Four times a day (QID) | CUTANEOUS | Status: DC
  Administered 2024-08-01 – 2024-08-03 (×5): 4 g via TOPICAL
  Filled 2024-08-01: qty 100

## 2024-08-01 MED ORDER — PANCRELIPASE (LIP-PROT-AMYL) 36000-114000 UNITS PO CPEP
36000.0000 [IU] | ORAL_CAPSULE | Freq: Three times a day (TID) | ORAL | Status: DC
  Administered 2024-08-01 – 2024-08-03 (×5): 36000 [IU] via ORAL
  Filled 2024-08-01 (×8): qty 1

## 2024-08-01 NOTE — ED Provider Notes (Signed)
 Dr. Drury of psychiatry has seen patient.  He does not feel patient needs inpatient psychiatric hospitalization.  He is not currently suicidal.  He advises giving hydroxyzine  now to see if patient can rest and have patient be reevaluated by psychiatry in the morning.  Continue CIWA protocol to avoid alcohol  withdrawal.  Most recent CIWA score is 12.  He is not tachycardic and resting comfortably.   Carita Senior, MD 08/01/24 [phone]

## 2024-08-01 NOTE — ED Provider Notes (Signed)
 Emergency Medicine Observation Re-evaluation Note  Gary [name] is a 38 y.o. male, seen on rounds today.  Pt initially presented to the ED for complaints of Alcohol  Problem, Suicidal, and Homicidal Currently, the patient is complaining of anxiety and tremors.  Says he is going to stop drinking.  Physical Exam  BP 114/75 (BP Location: Right Arm)   Pulse 71   Temp 98.3 F (36.8 C) (Oral)   Resp 16   SpO2 98%  Physical Exam General: Somewhat anxious appearing.   Neuro: Generalized tremors of his hands.  Tongue fasciculations noted Lungs: Normal work of breathing Psych: Anxious  ED Course / MDM  EKG:   I have reviewed the labs performed to date as well as medications administered while in observation.  Recent changes in the last 24 hours include cleared by psychiatry.  They have signed off since patient is sober and is no longer suicidal or homicidal.  Plan  Current plan is for treatment of alcohol  withdrawals.  Says he is going to stop drinking at this point in time.  Unfortunately it appears the patient has gone into withdrawals and does have a history of severe withdrawals so we will go ahead and treat him with phenobarbital  and likely admit   Yolande Lamar BROCKS, MD 08/01/24 [phone]

## 2024-08-01 NOTE — ED Notes (Addendum)
 IVC paperwork will go with patient upstairs. All belongings will remain in locker #2 in the ED. Home medications have been stored in pharmacy.

## 2024-08-01 NOTE — H&P (Signed)
 Date: 08/01/2024         Patient Name:  Gary [name] MRN: [id number]  DOB: [birth year] Age / Sex: 38 y.o., male   PCP: Patient, No Pcp Per         Medical Service: Internal Medicine Teaching Service         Attending Physician: Dr. Eben Reyes BROCKS, MD    First Contact: Dr. Isobel Pager: [phone]  Second Contact: Dr. Marylu Pager: [phone]                 Chief Concern: I want to stop drinking  History of Present Illness: 38 year old came to the ED by EMS yesterday for alcohol  intoxication with thoughts of hurting self and others. He was observed overnight, psychiatry saw in consult after acute intoxication resolved and he was no longer suicidal or homicidal. He wants help to stop drinking alcohol . He is feeling somewhat better now than when he first arrived to the ED. He reports impaired balance and weakness, generalized aches and pains.  History notable for HIV disease on Biktarvy , and chronic pancreatitis. Other history abstracted from chart include severe alcohol  withdrawal, pancytopenia since ~2020.  He is staying at a friend's house in [place], but doesn't plan to return after discharge. Rather, he says he'll sleep outside for a while. He drinks heavily every day. He usually makes two trips to the liquor store each day and buys a 5th of vodka each time. Recently switched from vodka to lots of wine. Smokes cigarettes, doesn't use other drugs.  ED course per above, also notable for laboratory findings of alcohol  level ~350, pancytopenia with severe neutropenia. Maxillofacial, head, c-spine CT were unremarkable, ordered for some minor trauma on admission.  ROS per above, also negative for fever.  Allergies: Allergies  Allergen Reactions   Risperidone  And Paliperidone Other (See Comments)    Patient states causes opposite effect (he will not take again), and gave him psychotic thoughts   Tegretol  [Carbamazepine ] Other (See Comments)    Vertigo  Paralysis   Caffeine  Palpitations    Past Medical History: Patient Active Problem List   Diagnosis Date Noted   Acute pancreatitis 06/23/2024   Depression with anxiety 05/21/2024   GERD (gastroesophageal reflux disease) 05/21/2024   Hypercalcemia 05/21/2024   Dehydration 05/19/2024   Heat exposure 05/19/2024   Epigastric pain 05/09/2024   SIRS (systemic inflammatory response syndrome) (HCC) 05/09/2024   Bipolar 2 disorder (HCC) 05/09/2024   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   Brief psychotic disorder (HCC) 04/20/2024   Hypotension 03/31/2024   High risk for readmission 03/01/2024   Alcohol  withdrawal (HCC) 02/25/2024   Severe protein-calorie malnutrition 02/25/2024   Alcohol  induced acute pancreatitis 02/14/2024   Pancreatic lesion 02/13/2024   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/2024   Ileus (HCC) 12/25/2023   PTSD (post-traumatic stress disorder) 12/25/2023   Generalized anxiety disorder 12/20/2023   Pancreatitis, necrotizing 12/17/2023   Tobacco use disorder 12/17/2023   Acute alcoholic pancreatitis 12/10/2023   Acute on chronic anemia 12/05/2023   Seizure (HCC) 11/19/2023   Gastritis and duodenitis 11/19/2023   Nausea and vomiting 11/01/2023   Chronic pancreatitis (HCC) 10/16/2023   Acute on chronic pancreatitis (HCC) 10/16/2023   Alcoholic fatty liver 09/25/2023   Hypomagnesemia 12/24/2022   Seizure disorder (HCC) 12/17/2022   Pancytopenia (HCC) 12/14/2022   Alcohol -induced pancreatitis 12/14/2022   Delirium tremens (HCC) 10/19/2022   Alcohol  dependence with withdrawal (HCC) 10/16/2022   Homelessness 05/28/2022   Metabolic acidosis,  increased anion gap 04/16/2022   Hypokalemia 01/22/2022   Constipation 07/30/2021   Severe neutropenia 07/26/2021   Alcohol  abuse    Malingering 05/15/2020   Alcohol  intoxication with delirium 07/02/2019   Alcohol -induced mood disorder (HCC) 05/13/2019   Alcohol  use disorder, severe, dependence (HCC) 04/16/2019   Severe episode of recurrent major  depressive disorder, without psychotic features (HCC) 04/16/2019   HIV disease (HCC) 06/16/2018   Polysubstance abuse (HCC) 06/16/2018   Nicotine  dependence, cigarettes, uncomplicated 06/16/2018   Past Medical History:  Diagnosis Date   Alcohol  abuse    Alcohol -induced pancreatitis 04/16/2022   Anxiety    Bipolar 2 disorder (HCC)    HIV (human immunodeficiency virus infection) (HCC)    Pancreatitis    PTSD (post-traumatic stress disorder)    Schizophrenia (HCC)    Seizures (HCC)    Subdural hematoma (HCC)     Medications: No current facility-administered medications on file prior to encounter.   Current Outpatient Medications on File Prior to Encounter  Medication Sig Dispense Refill   bictegravir-emtricitabine -tenofovir  AF (BIKTARVY ) 50-200-25 MG TABS tablet Take 1 tablet by mouth daily. 30 tablet 5   folic acid  (FOLVITE ) 1 MG tablet Take 1 tablet (1 mg total) by mouth daily. 90 tablet 1   gabapentin  (NEURONTIN ) 100 MG capsule Take 1 capsule (100 mg total) by mouth at bedtime. (Patient taking differently: Take 100 mg by mouth 3 (three) times daily.) 15 capsule 0   levETIRAcetam  (KEPPRA ) 500 MG tablet Take 1 tablet (500 mg total) by mouth 2 (two) times daily. 60 tablet 0   lipase/protease/amylase (CREON ) 36000 UNITS CPEP capsule Take 1 capsule (36,000 Units total) by mouth 3 (three) times daily before meals. 270 capsule 2   pantoprazole  (PROTONIX ) 40 MG tablet Take 1 tablet (40 mg total) by mouth daily. 30 tablet 0   thiamine  (VITAMIN B1) 100 MG tablet Take 1 tablet (100 mg total) by mouth daily. 90 tablet 0   [DISCONTINUED] famotidine  (PEPCID ) 20 MG tablet Take 1 tablet (20 mg total) by mouth 2 (two) times daily. (Patient not taking: Reported on 06/01/2019) 30 tablet 0     Surgical History: Past Surgical History:  Procedure Laterality Date   BIOPSY  04/19/2022   Procedure: BIOPSY;  Surgeon: Wilhelmenia Aloha Raddle., MD;  Location: [facility];  Service: Gastroenterology;;    ENTEROSCOPY N/A 04/19/2022   Procedure: ENTEROSCOPY;  Surgeon: Wilhelmenia Aloha Raddle., MD;  Location: [facility] ENDOSCOPY;  Service: Gastroenterology;  Laterality: N/A;   INCISION AND DRAINAGE PERIRECTAL ABSCESS N/A 09/24/2016   Procedure: IRRIGATION AND DEBRIDEMENT PERIRECTAL ABSCESS;  Surgeon: Carlin Pastel, MD;  Location: [facility] ORS;  Service: General;  Laterality: N/A;   none      Family History:  Family History  Problem Relation Age of Onset   Hypertension Mother    Alcohol  abuse Mother    Alcoholism Mother    Alcohol  abuse Father    Colon cancer Other    Other Other    Cancer Other     Social History:  Social History   Socioeconomic History   Marital status: Single    Spouse name: Not on file   Number of children: Not on file   Years of education: Not on file   Highest education level: Not on file  Occupational History   Occupation: unemployed  Tobacco Use   Smoking status: Every Day    Current packs/day: 0.50    Average packs/day: 0.5 packs/day for 21.7 years (10.9 ttl pk-yrs)    Types: Cigarettes  Start date: 2004    Passive exposure: Current   Smokeless tobacco: Never   Tobacco comments:    unable to smoke while incarcerated 6+ months 02/13/20  Vaping Use   Vaping status: Never Used  Substance and Sexual Activity   Alcohol  use: Yes    Alcohol /week: 105.0 standard drinks of alcohol     Types: 105 Cans of beer per week    Comment: drinks every day, all day, whatever I can get my hands on. Half a gallon of vodka 12/16   Drug use: Not Currently    Types: Methamphetamines, Cocaine    Comment: last used 10/22/2023   Sexual activity: Yes    Partners: Male, Male    Comment: declined condoms 01/2024  Other Topics Concern   Not on file  Social History Narrative   Currently living on the streets   Independent at baseline   Occasionally goes to the [facility]   Social Drivers of Health   Financial Resource Strain: Patient Declined (10/11/2023)   Overall Financial  Resource Strain (CARDIA)    Difficulty of Paying Living Expenses: Patient declined  Food Insecurity: Patient Declined (06/24/2024)   Hunger Vital Sign    Worried About Running Out of Food in the Last Year: Patient declined    Ran Out of Food in the Last Year: Patient declined  Transportation Needs: Patient Declined (06/24/2024)   PRAPARE - Administrator, Civil Service (Medical): Patient declined    Lack of Transportation (Non-Medical): Patient declined  Physical Activity: Not on file  Stress: No Stress Concern Present (06/30/2022)   Received from [facility] of Occupational Health - Occupational Stress Questionnaire    Feeling of Stress : Not at all  Recent Concern: Stress - Stress Concern Present (06/25/2022)   Received from [facility] of Occupational Health - Occupational Stress Questionnaire    Feeling of Stress : Rather much  Social Connections: Patient Declined (04/18/2024)   Social Connection and Isolation Panel    Frequency of Communication with Friends and Family: Patient declined    Frequency of Social Gatherings with Friends and Family: Patient declined    Attends Religious Services: Patient declined    Database administrator or Organizations: Patient declined    Attends Banker Meetings: Patient declined    Marital Status: Patient declined  Recent Concern: Social Connections - Socially Isolated (02/03/2024)   Social Connection and Isolation Panel    Frequency of Communication with Friends and Family: Never    Frequency of Social Gatherings with Friends and Family: Never    Attends Religious Services: Never    Database administrator or Organizations: No    Attends Banker Meetings: Never    Marital Status: Never married  Intimate Partner Violence: Patient Declined (06/24/2024)   Humiliation, Afraid, Rape, and Kick questionnaire    Fear of Current or Ex-Partner: Patient declined    Emotionally  Abused: Patient declined    Physically Abused: Patient declined    Sexually Abused: Patient declined    Physical Exam: Blood pressure 119/77, pulse 70, temperature 98.3 F (36.8 C), temperature source Oral, resp. rate 16, SpO2 98%.  Disheveled, in paper scrubs Heart rate and rhythm normal, no murmurs, strong radial pulse Breathing comfortably, no lung crackles Skin warm and dry Alert and oriented, speech is fluent, unstable gait, some difficulty maintaining balance, moving all extremities normally, no facial asymmetry No SI/HI, monotonous, flat affect, no audio/visual hallucinations  EKG:  Normal sinus rhythm, rate 80  Labs: Per above  Images and other studies: Per above  Assessment & Plan:  COLDEN SAMARAS is a 38 y.o. with history of HIV and severe alcohol  use disorder presents with SI during acute intoxication and admitted for monitored alcohol  withdrawal.  Principal Problem:   Alcohol  withdrawal (HCC) Clinically stable now. CIWA max of 13 overnight. Phenobarbital  taper started in ED, will continue this. He also takes gabapentin  outside of hospital for maintenance of abstinence from alcohol . History of severe withdrawal with seizure and DT, he's at high risk for this again. For now admit to med-surg for monitoring. -CIWA monitoring -phenobarbital  taper -ativan  1 mg po q6 h prn for anxiety and sleep -resume outpatient gabapentin  100 mg tid  Active Problems:   HIV disease (HCC) Chronic, follows with [facility], on Biktarvy  with reasonable adherence in the past. Resume Biktarvy , check CD4 count and viral load.    Alcohol  use disorder, severe, dependence (HCC) Chronic and motivated to be abstinent. At least two 5th of liquor or equivalent daily. Ethanol level was 378 on presentation to ED. He has history of severe withdrawal. He looks malnourished. CIWA per above. Start high dose thiamine  for Wernicke prophylaxis in this malnourished patient with gait instability. Start folate  supplementation.    Severe episode of recurrent major depressive disorder, without psychotic features (HCC)   Generalized anxiety disorder Likely substance induced, presented with SI/HI initially but this has resolved. Psychiatry is following.       Pancytopenia (HCC)   Severe neutropenia History of neutropenia, but now with ANC 500. Afebrile. Probably sequelae of alcohol  use, malnutrition, chronic HIV disease. Monitor clinically, trend CBC w/ diff.    Seizure disorder (HCC) Chronic, probably in setting of chronic alcohol  use and multiple severe episodes of withdrawal. Resume outpatient Keppra . -Keppra  500 mg bid    Chronic pancreatitis (HCC) Due to chronic alcohol  use disorder. Resume outpatient pancreatic enzyme supplementation.  Level of care: med-surg Diet: regular VTE: enoxaparin  (LOVENOX ) injection 40 mg Start: 08/01/24 1230 Code: full Surrogate: brother, Chyrl Montenegro  Signed: Ozell Kung MD 08/01/2024, 2:27 PM Pager: [phone]

## 2024-08-01 NOTE — Hospital Course (Addendum)
 Gary [name] is a 38 y.o. with history of HIV and severe alcohol  use disorder presents with SI during acute intoxication and admitted for monitored alcohol  withdrawal.     Plan: #Alcohol  withdrawal (HCC) Clinically stable now. CIWA max of 18 while inpatient, eventually lowered to 5 by discharge. Patient requiring multiple rounds of Ativan  to improve symptoms. Phenobarbital  taper started in ED, will continue this. He also takes gabapentin  outside of hospital for maintenance of abstinence from alcohol . History of severe withdrawal with seizure and DT, he's at high risk for this again.  Given IVC status, psychiatry was consulted and rescinded his IVC hold. Patient expressed initial interest in abstinence from alcohol , including staying with his brother in [place] (who does not drink), attending Alcoholic Anonymous, and starting an agent such as Acamprosate or Naltrexone  to reduce alcohol  craving. However, he had multiple reactionary decisions to revoke his decision about quitting when his pain surrounding his chronic pancreatitis became increasingly problematic. After extensive discussions about how alcohol  worsens his pancreatitis pain, how Naltrexone  can quiet the alcohol  cravings he has, and how phenobarbital  taper is assisting with his withdrawal symptoms currently, the patient agreed to utilize these resources available to him to remain abstinent from alcohol . Patient was set up with PCP in [place], close to his brother's house, to help with Naltrexone  refills, and better accountability.    #AIDS #HIV Disease (HCC) AIDs diagnosis new. Chronically with HIV, follows with [facility] at Dr. Constance Vu's office, on Biktarvy  with reasonable adherence in the past.  Due to phenobarb taper, changing ART to TIVICAY  50mg  BID and Truvuda 200-300mg  daily to accommodate.  CD4 count low at 182, with HIV 1 RNA quant at 152,000.  Placed on Bactrim  800-160 MG, once every Monday Wednesday Friday for prophylaxis for PJP. Will  discharge on Truvada  200-300mg  daily and Tivicay  50mg  twice daily until phenobarb taper is complete. Emphasized importance to patient to follow up with ID given new diagnosis. Patient voiced understanding.      #Alcohol  use disorder, severe, dependence (HCC) Chronic. Motivated to be abstinent. At least two 5th of liquor or equivalent daily. Ethanol level was 378 on presentation to ED. He has history of severe withdrawal. Malnourished. CIWA peaked to 18 last night. Patient was started high dose thiamine  for Wernicke prophylaxis, however patient removed his IV line. Switched IV thiamine  to PO thiamine  500mg  and Folate 1mg .   #Hyponatremia Sodium found to be 132, down from 140 on admission. Potentially due to potomania vs SIADH. Sodium continuing to decrease to 130. Urine Osmol 649, Urine Na 57, Serum Osmol 278, suggesting possible pseudohyponatremia vs SIADH. Closely monitored while inpatient. Patient AxOx4, no signs of lethargy or obtundation. PO intake is less than usual given patient's abdominal pain, but able to keep down PO fluids and food. Needs to be followed up outpatient.    #Hypokalemia, resolved 3.4 yesterday. Gave 40mEq tablet, now 4.0.     #Severe episode of recurrent major depressive disorder, without psychotic features (HCC)  #Generalized anxiety disorder Likely substance induced, presented with SI/HI initially but this has resolved. Psychiatry followed and rescinded IVC status.      #Pancytopenia (HCC)  #Severe neutropenia History of neutropenia, but now with ANC 1200. Afebrile. Probably sequelae of alcohol  use, malnutrition, chronic HIV disease. Monitored CBC w/ diff.    #Seizure disorder (HCC) Chronic, probably in setting of chronic alcohol  use and multiple severe episodes of withdrawal. Continued Keppra  500 mg bid.    #Chronic pancreatitis (HCC) Due to chronic alcohol  use  disorder. On outpatient pancreatic enzyme supplementation. Continued Creon  TID.

## 2024-08-01 NOTE — Consult Note (Signed)
 [facility] Consult Note  Patient Name: Gary [name] MRN: [id number] DOB: July 19, 1986 DATE OF Consult: 08/01/2024  PRIMARY PSYCHIATRIC DIAGNOSES   1.  Generalized Anxiety Disorder 2.  Alcohol  Dependence, Severe   RECOMMENDATIONS  Recommendations: Medication recommendations: See below Non-Medication/therapeutic recommendations: Continue with close medical observation via CIWA protocol.  Continue with matter-of-fact emotional support in the ED, pending re-evaluation Is inpatient psychiatric hospitalization recommended for this patient? No (Explain why): Now that patient is sober, he is not experiencing any suicidal ideation, nor any homicidal ideation.  He no longer meets criteria for inpatient psychiatric admission Is another care setting recommended for this patient? (examples may include Crisis Stabilization Unit, Residential/Recovery Treatment, ALF/SNF, Memory Care Unit)  Yes (Explain why): If patient were to agree, given his severe alcohol  dependence, he would benefit again from  a longer-term period of care in a rehab program, especially given his continuing medical sequelae from the EtOH usage From a psychiatric perspective, is this patient appropriate for discharge to an outpatient setting/resource or other less restrictive environment for continued care?  Yes (Explain why): If patient refuses rehab referral, then pending re-evaluation by Gary [name], he would appropriate for referral to outpatient substance use and psychiatric (perhaps dual diagnosis) treatment. Follow-Up Telepsychiatry C/L services: We will sign off for now. Please re-consult our service if needed for any concerning changes in the patient's condition, discharge planning, or questions. Communication: Treatment team members (and family members if applicable) who were involved in treatment/care discussions and planning, and with whom we spoke or engaged with via secure text/chat, include the following: Secure message for Dr.  Carita, ED attending, and staff, outlining current recommendations  Have given patient hydroxyzine  100 mg now.  Patient states that has not previously taken medication for anxiety.  See how patient responds (e.g., gets some sleep, etc), and then during day shift, recommend that [name] re-evaluate to see if it would be helpful to send him home with a supply at even 100 mg q6h PRN.  Went over major side effects, esp grogginess and dry mouth.  Usually at this dosage (the high end of recommended) there are not CNS problems, e.g., seizures.  Most recent vital signs were OK, but recommend that while in ED, do a CIWA protocol to make sure that he does not have precipitous onset of withdrawal sx's, given his history of seizures and DT's.    Given patient's history, his prognosis, especially for follow-through, is not good.  But worthwhile to see if he gets any relief at all from the hydroxyzine , as well as make sure that he does not deteriorate further from a medical standpoint.  Thank you for involving us  in the care of this patient. If you have any additional questions or concerns, please call [phone] and ask for the provider on-call.   TELEPSYCHIATRY ATTESTATION & CONSENT   As the provider for this telehealth consult, I attest that I verified the patient's identity using two separate identifiers, introduced myself to the patient, provided my credentials, disclosed my location, and performed this encounter via a HIPAA-compliant, real-time, face-to-face, two-way, interactive audio and video platform and with the full consent and agreement of the patient (or guardian as applicable.)   Patient physical location: [name]. Telehealth provider physical location: home office in state of [place] .  Video start time: 0330h EDT  Video end time: 0345h EDT   Total time spent in this encounter was 30 minutes, including record review, clinical interview, behavior observations, discussion of  impressions and  recommendations (including medications and hospitalization), and consultation/communication with relevant parties   IDENTIFYING DATA  Gary [name] is a 38 y.o. year-old male for whom a psychiatric consultation has been ordered by the primary provider. The patient was identified using two separate identifiers.  CHIEF COMPLAINT/REASON FOR CONSULT   I've got to get off this alcohol , but my anxiety is so bad.   HISTORY OF PRESENT ILLNESS (HPI)  The patient presents with a very long history of anxiety, with self-treatment with alcohol , leading to very serious alcohol  dependence, with multiple medical issues as a result.  Patient frequently comes to the ED for a variety of medical complaints related to his longstanding EtOH, now drinking 5-6 bottles of wine a day (which he admits, he usually steals).  Does have history of multiple ED and hospital stays for EtOH-related problems, with a history of both withdrawals seizures and DT's.  He now comes in again, intoxicated at admission (BAL xxx), and making suicidal statements upon coming in.  Does have history of overdose in the past with some psychiatric admissions for depression/anxiety.  Now that he is sober, however, he is not suicidal or homicidal.  No command hallucinations or signs current of acute delirium.  Patient does, however, have history of significant withdrawal sx's, and thus his periods of sobriety are short-lived.  Did go to a rehab earlier this year, and did have a monitored admission in Jun 2025, but he, as usual, did not seek follow-up after these encounters.  Patient has [place] the woods in a tent.  He gets food by panhandling and EtOH via theft.  Does see a brother in [place] with some regularity.    PAST PSYCHIATRIC HISTORY   Otherwise as per HPI above.  PAST MEDICAL HISTORY  Past Medical History:  Diagnosis Date   Alcohol  abuse    Alcohol -induced pancreatitis 04/16/2022   Anxiety    Bipolar 2 disorder  (HCC)    HIV (human immunodeficiency virus infection) (HCC)    Pancreatitis    PTSD (post-traumatic stress disorder)    Schizophrenia (HCC)    Seizures (HCC)    Subdural hematoma (HCC)      HOME MEDICATIONS  Facility Ordered Medications  Medication   LORazepam  (ATIVAN ) tablet 1-4 mg   Or   LORazepam  (ATIVAN ) injection 1-4 mg   [COMPLETED] lactated ringers  bolus 1,000 mL   thiamine  (VITAMIN B1) tablet 100 mg   Or   thiamine  (VITAMIN B1) injection 100 mg   folic acid  (FOLVITE ) tablet 1 mg   multivitamin with minerals tablet 1 tablet   [COMPLETED] magnesium  sulfate IVPB 2 g 50 mL   [COMPLETED] famotidine  (PEPCID ) IVPB 20 mg premix   [COMPLETED] pantoprazole  (PROTONIX ) injection 40 mg   [COMPLETED] alum & mag hydroxide-simeth (MAALOX/MYLANTA) 200-200-20 MG/5ML suspension 30 mL   [COMPLETED] fentaNYL  (SUBLIMAZE ) injection 25 mcg   hydrOXYzine  (ATARAX ) tablet 100 mg   Followed by   hydrOXYzine  (ATARAX ) tablet 100 mg   PTA Medications  Medication Sig   bictegravir-emtricitabine -tenofovir  AF (BIKTARVY ) 50-200-25 MG TABS tablet Take 1 tablet by mouth daily.   lipase/protease/amylase (CREON ) 36000 UNITS CPEP capsule Take 1 capsule (36,000 Units total) by mouth 3 (three) times daily before meals.   folic acid  (FOLVITE ) 1 MG tablet Take 1 tablet (1 mg total) by mouth daily.   levETIRAcetam  (KEPPRA ) 500 MG tablet Take 1 tablet (500 mg total) by mouth 2 (two) times daily.   thiamine  (VITAMIN B1) 100 MG tablet Take 1 tablet (100 mg  total) by mouth daily.   nicotine  (NICODERM CQ  - DOSED IN MG/24 HOURS) 14 mg/24hr patch Place 1 patch (14 mg total) onto the skin daily. (Patient not taking: Reported on 08/01/2024)   gabapentin  (NEURONTIN ) 100 MG capsule Take 1 capsule (100 mg total) by mouth at bedtime.   pantoprazole  (PROTONIX ) 40 MG tablet Take 1 tablet (40 mg total) by mouth daily.   oxyCODONE -acetaminophen  (PERCOCET) 10-325 MG tablet Take 1 tablet by mouth every 8 (eight) hours as needed for  pain. (Patient not taking: Reported on 08/01/2024)   sucralfate  (CARAFATE ) 1 g tablet Take 1 tablet (1 g total) by mouth 4 (four) times daily -  with meals and at bedtime.     ALLERGIES  Allergies  Allergen Reactions   Risperidone  And Paliperidone Other (See Comments)    Patient states causes opposite effect (he will not take again), and gave him psychotic thoughts   Tegretol  [Carbamazepine ] Other (See Comments)    Vertigo  Paralysis   Caffeine Palpitations    SOCIAL & SUBSTANCE USE HISTORY  Social History   Socioeconomic History   Marital status: Single    Spouse name: Not on file   Number of children: Not on file   Years of education: Not on file   Highest education level: Not on file  Occupational History   Occupation: unemployed  Tobacco Use   Smoking status: Every Day    Current packs/day: 0.50    Average packs/day: 0.5 packs/day for 21.7 years (10.9 ttl pk-yrs)    Types: Cigarettes    Start date: 2004    Passive exposure: Current   Smokeless tobacco: Never   Tobacco comments:    unable to smoke while incarcerated 6+ months 02/13/20  Vaping Use   Vaping status: Never Used  Substance and Sexual Activity   Alcohol  use: Yes    Alcohol /week: 105.0 standard drinks of alcohol     Types: 105 Cans of beer per week    Comment: drinks every day, all day, whatever I can get my hands on. Half a gallon of vodka 12/16   Drug use: Not Currently    Types: Methamphetamines, Cocaine    Comment: last used 10/22/2023   Sexual activity: Yes    Partners: Male, Male    Comment: declined condoms 01/2024  Other Topics Concern   Not on file  Social History Narrative   Currently living on the streets   Independent at baseline   Occasionally goes to the [facility]   Social Drivers of Health   Financial Resource Strain: Patient Declined (10/11/2023)   Overall Financial Resource Strain (CARDIA)    Difficulty of Paying Living Expenses: Patient declined  Food Insecurity: Patient Declined  (06/24/2024)   Hunger Vital Sign    Worried About Running Out of Food in the Last Year: Patient declined    Ran Out of Food in the Last Year: Patient declined  Transportation Needs: Patient Declined (06/24/2024)   PRAPARE - Administrator, Civil Service (Medical): Patient declined    Lack of Transportation (Non-Medical): Patient declined  Physical Activity: Not on file  Stress: No Stress Concern Present (06/30/2022)   Received from [facility] of Occupational Health - Occupational Stress Questionnaire    Feeling of Stress : Not at all  Recent Concern: Stress - Stress Concern Present (06/25/2022)   Received from [facility] of Occupational Health - Occupational Stress Questionnaire    Feeling of Stress : Rather much  Social Connections: Patient Declined (04/18/2024)   Social Connection and Isolation Panel    Frequency of Communication with Friends and Family: Patient declined    Frequency of Social Gatherings with Friends and Family: Patient declined    Attends Religious Services: Patient declined    Database administrator or Organizations: Patient declined    Attends Banker Meetings: Patient declined    Marital Status: Patient declined  Recent Concern: Social Connections - Socially Isolated (02/03/2024)   Social Connection and Isolation Panel    Frequency of Communication with Friends and Family: Never    Frequency of Social Gatherings with Friends and Family: Never    Attends Religious Services: Never    Database administrator or Organizations: No    Attends Engineer, structural: Never    Marital Status: Never married   Social History   Tobacco Use  Smoking Status Every Day   Current packs/day: 0.50   Average packs/day: 0.5 packs/day for 21.7 years (10.9 ttl pk-yrs)   Types: Cigarettes   Start date: 2004   Passive exposure: Current  Smokeless Tobacco Never  Tobacco Comments   unable to smoke while  incarcerated 6+ months 02/13/20   Social History   Substance and Sexual Activity  Alcohol  Use Yes   Alcohol /week: 105.0 standard drinks of alcohol    Types: 105 Cans of beer per week   Comment: drinks every day, all day, whatever I can get my hands on. Half a gallon of vodka 12/16   Social History   Substance and Sexual Activity  Drug Use Not Currently   Types: Methamphetamines, Cocaine   Comment: last used 10/22/2023    .  FAMILY HISTORY  Family History  Problem Relation Age of Onset   Hypertension Mother    Alcohol  abuse Mother    Alcoholism Mother    Alcohol  abuse Father    Colon cancer Other    Other Other    Cancer Other    Family Psychiatric History (if known):  Did not report  MENTAL STATUS EXAM (MSE)  Mental Status Exam: General Appearance: Disheveled  Orientation:  Full (Time, Place, and Person)  Memory:  Immediate;   Fair Recent;   Fair Remote;   Fair  Concentration:  Concentration: Fair and Attention Span: Fair  Recall:  Fair  Attention  Fair  Eye Contact:  Minimal  Speech:  Slow  Language:  Good  Volume:  Decreased  Mood: I just get so anxious  Affect:  Flat  Thought Process:  Descriptions of Associations: Circumstantial  Thought Content:  Focused on daily matters, without psychotic sx's  Suicidal Thoughts:  No  Homicidal Thoughts:  No  Judgement:  Poor  Insight:  Shallow  Psychomotor Activity:  Psychomotor Retardation  Akathisia:  No  Fund of Knowledge:  Fair    Assets:  Manufacturing systems engineer Social Support  Cognition:  WNL  ADL's:  Intact  AIMS (if indicated):       VITALS  Blood pressure 116/75, pulse 74, temperature 98 F (36.7 C), temperature source Oral, resp. rate 16, SpO2 100%.  LABS  Admission on 07/31/2024  Component Date Value Ref Range Status   WBC 07/31/2024 1.8 (L)  4.0 - 10.5 K/uL Final   REPEATED TO VERIFY   RBC 07/31/2024 3.79 (L)  4.22 - 5.81 MIL/uL Final   Hemoglobin 07/31/2024 11.2 (L)  13.0 - 17.0 g/dL Final    HCT 90/77/7974 34.1 (L)  39.0 - 52.0 % Final   MCV  07/31/2024 90.0  80.0 - 100.0 fL Final   MCH 07/31/2024 29.6  26.0 - 34.0 pg Final   MCHC 07/31/2024 32.8  30.0 - 36.0 g/dL Final   RDW 90/77/7974 22.6 (H)  11.5 - 15.5 % Final   Platelets 07/31/2024 82 (L)  150 - 400 K/uL Final   Comment: SPECIMEN CHECKED FOR CLOTS REPEATED TO VERIFY Immature Platelet Fraction may be clinically indicated, consider ordering this additional test [id number]    nRBC 07/31/2024 0.0  0.0 - 0.2 % Final   Neutrophils Relative % 07/31/2024 30  % Final   Neutro Abs 07/31/2024 0.5 (L)  1.7 - 7.7 K/uL Final   Lymphocytes Relative 07/31/2024 54  % Final   Lymphs Abs 07/31/2024 1.0  0.7 - 4.0 K/uL Final   Monocytes Relative 07/31/2024 11  % Final   Monocytes Absolute 07/31/2024 0.2  0.1 - 1.0 K/uL Final   Eosinophils Relative 07/31/2024 3  % Final   Eosinophils Absolute 07/31/2024 0.1  0.0 - 0.5 K/uL Final   Basophils Relative 07/31/2024 2  % Final   Basophils Absolute 07/31/2024 0.0  0.0 - 0.1 K/uL Final   WBC Morphology 07/31/2024 MORPHOLOGY UNREMARKABLE   Final   RBC Morphology 07/31/2024 MORPHOLOGY UNREMARKABLE   Final   Smear Review 07/31/2024 Normal platelet morphology   Final   Immature Granulocytes 07/31/2024 0  % Final   Abs Immature Granulocytes 07/31/2024 0.00  0.00 - 0.07 K/uL Final   Performed at [facility], [address], [place], [place] [address]   Sodium 07/31/2024 140  135 - 145 mmol/L Final   Potassium 07/31/2024 3.4 (L)  3.5 - 5.1 mmol/L Final   Chloride 07/31/2024 101  98 - 111 mmol/L Final   CO2 07/31/2024 26  22 - 32 mmol/L Final   Glucose, Bld 07/31/2024 94  70 - 99 mg/dL Final   Glucose reference range applies only to samples taken after fasting for at least 8 hours.   BUN 07/31/2024 <5 (L)  6 - 20 mg/dL Final   Creatinine, Ser 07/31/2024 0.42 (L)  0.61 - 1.24 mg/dL Final   Calcium  07/31/2024 7.9 (L)  8.9 - 10.3 mg/dL Final   Total Protein 90/77/7974 6.5  6.5 - 8.1 g/dL Final    Albumin 90/77/7974 3.5  3.5 - 5.0 g/dL Final   AST 90/77/7974 51 (H)  15 - 41 U/L Final   ALT 07/31/2024 33  0 - 44 U/L Final   Alkaline Phosphatase 07/31/2024 54  38 - 126 U/L Final   Total Bilirubin 07/31/2024 0.4  0.0 - 1.2 mg/dL Final   GFR, Estimated 07/31/2024 >60  >60 mL/min Final   Comment: (NOTE) Calculated using the CKD-EPI Creatinine Equation (2021)    Anion gap 07/31/2024 13  5 - 15 Final   Performed at [facility], [address] [place], [place] [address]   Alcohol , Ethyl (B) 07/31/2024 378 (HH)  <15 mg/dL Final   Comment: CRITICAL RESULT CALLED TO, READ BACK BY AND VERIFIED WITH S SIMS,RN 1931 07/31/2024 WBOND (NOTE) For medical purposes only. Performed at [facility], [address] [place], [place] [address]    Lipase 07/31/2024 35  11 - 51 U/L Final   Performed at [facility], [address] [place], [place] [address]   B Natriuretic Peptide 07/31/2024 16.9  0.0 - 100.0 pg/mL Final   Performed at [facility], [address], [place], [place] [address]   Troponin  I (High Sensitivity) 07/31/2024 6  <18 ng/L Final   Comment: (NOTE) Elevated high sensitivity troponin I (hsTnI) values and significant  changes across serial measurements may suggest ACS but many other  chronic and acute conditions are known to elevate hsTnI results.  Refer to the Links section for chest pain algorithms and additional  guidance. Performed at [facility], [address], [place], [place] [address]    Troponin T High Sensitivity 07/31/2024 <15  0 - 19 ng/L Final   Comment: (NOTE) Biotin concentrations > 1000 ng/mL falsely decrease TnT results.  Serial cardiac troponin measurements are suggested.  Refer to the Links section for chest pain algorithms and additional  guidance. Performed at [facility], [address], [place], [place] [address]     PSYCHIATRIC REVIEW OF SYSTEMS (ROS)  ROS: Notable for the  following relevant positive findings: Review of Systems  Constitutional: Negative.   HENT: Negative.    Eyes: Negative.   Respiratory: Negative.    Cardiovascular: Negative.   Gastrointestinal:  Positive for abdominal pain and nausea.  Genitourinary: Negative.   Musculoskeletal: Negative.   Skin: Negative.   Neurological:  Positive for tremors and headaches.  Psychiatric/Behavioral:  Positive for substance abuse. The patient is nervous/anxious.     Additional findings:      Musculoskeletal: No abnormal movements observed      Gait & Station: Laying/Sitting      Pain Screening: Present - mild to moderate (Chronic Abdominal pain)      Nutrition & Dental Concerns: Reviewed  RISK FORMULATION/ASSESSMENT  Is the patient experiencing any suicidal or homicidal ideations: No        Now that patient is sober, having no thoughts of harm to self or others.  Protective factors considered for safety management:   Patient has chronic lack of resources in the community, in spite of attempts by medical and social services staff to hook him into such services.  He does, however, return to ED for evaluation if he has acute exacerbation of worst medical sx's.  Risk factors/concerns considered for safety management:  Prior attempt Depression Substance abuse/dependence Physical illness/chronic pain Access to lethal means Impulsivity Isolation Unwillingness to seek help Male gender Unmarried  Is there a safety management plan with the patient and treatment team to minimize risk factors and promote protective factors: No           Explain:   Multiple parties have attempted to get patient into a consistent safety plan, but he does not follow through.  He does, however, return to ED for treatment if sx's do worsen.  Is crisis care placement or psychiatric hospitalization recommended: No     Based on my current evaluation and risk assessment, patient is determined at this time to be at:  Low  risk  *RISK ASSESSMENT Risk assessment is a dynamic process; it is possible that this patient's condition, and risk level, may change. This should be re-evaluated and managed over time as appropriate. Please re-consult psychiatric consult services if additional assistance is needed in terms of risk assessment and management. If your team decides to discharge this patient, please advise the patient how to best access emergency psychiatric services, or to call 911, if their condition worsens or they feel unsafe in any way.   Adriana JINNY Pontes, MD Telepsychiatry Consult Services

## 2024-08-01 NOTE — ED Notes (Signed)
 Awaiting

## 2024-08-01 NOTE — ED Notes (Addendum)
 Pt complains of nausea and pain described as cramping in lower abdomen. IP team aware
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
## 2024-08-01 NOTE — ED Notes (Signed)
 Awaiting phenobarbital  from main pharmacy

## 2024-08-01 NOTE — ED Notes (Signed)
 Awaiting thamine from main pharmacy.

## 2024-08-01 NOTE — Progress Notes (Signed)
 2030: Paged by nurse that patient was experiencing 10/10 abdominal pain. Visited patient at bedside and when writer entered the room, patient was resting comfortably in bed in no acute distress with 1:1 sitter at bedside. Patient states that his abdominal pain has been ongoing for years and has worsened in the past year. When asked if his abdominal pain has changed in quality, severity, or location since admission, he denied this. He points to his umbilical area as the region of pain. He endorses nausea but denies any episodes of vomiting. Last BM was this morning which was light brown in color and mostly solid in appearance. On physical exam, abdomen is soft, flat, tender only to umbilical-LLQ region, without tenderness to palpation in RLQ, RUQ, LUQ; bowel sounds appropriate to auscultation in all quadrants. When asked what he normally takes for this pain at home, he states that he either drinks a lot of alcohol  until the pain goes away, or takes handfuls of Dilaudid  10mg . When advised that we would begin with Tylenol  for the pain, patient was not pleased with this. However he agreed to give it a chance and would inform nurse if it did not work. EKG from today showed a Qtc of 454 ms, so we will add on Zofran  as needed for nausea. Of note, he is IVC'd and paperwork is scanned into chart. Personally went to ED to confirm with intake staff who confirmed that he is IVC'd.  Plan:  - Tylenol  650 mg q6 hrs prn pn  - Zofran  4 mg q8 hrs prn nausea     Doyal Miyamoto, MD [facility] Internal Medicine  PGY-1

## 2024-08-01 NOTE — ED Notes (Signed)
 Pt headed upstairs in wheelchair with transport and sitter

## 2024-08-01 NOTE — ED Provider Notes (Signed)
 Pt reassessed and tremor has improved. Ordered for total of 390 mg of IV phenobarb. Pharmacy contacted about PO phenobarb. Will admit to the hospital.    Yolande Lamar BROCKS, MD 08/01/24 1009

## 2024-08-01 NOTE — ED Notes (Signed)
 Ativan  held per Yolande, MD, aware of CIWA score of 13. Will adjust order to phenobarbital , awaiting orders

## 2024-08-01 NOTE — ED Notes (Addendum)
 Per Stuart MD, notify immediately if patient spikes fever

## 2024-08-01 NOTE — ED Notes (Signed)
 Pts medications sent to Pharmacy serial number [id number], 1 bag

## 2024-08-02 ENCOUNTER — Encounter (HOSPITAL_COMMUNITY): Payer: Self-pay | Admitting: Infectious Diseases

## 2024-08-02 ENCOUNTER — Other Ambulatory Visit: Payer: Self-pay

## 2024-08-02 DIAGNOSIS — B20 Human immunodeficiency virus [HIV] disease: Secondary | ICD-10-CM | POA: Diagnosis not present

## 2024-08-02 DIAGNOSIS — E876 Hypokalemia: Secondary | ICD-10-CM

## 2024-08-02 DIAGNOSIS — F10939 Alcohol use, unspecified with withdrawal, unspecified: Secondary | ICD-10-CM

## 2024-08-02 DIAGNOSIS — F10131 Alcohol abuse with withdrawal delirium: Secondary | ICD-10-CM

## 2024-08-02 DIAGNOSIS — E871 Hypo-osmolality and hyponatremia: Secondary | ICD-10-CM

## 2024-08-02 DIAGNOSIS — D709 Neutropenia, unspecified: Secondary | ICD-10-CM

## 2024-08-02 DIAGNOSIS — F332 Major depressive disorder, recurrent severe without psychotic features: Secondary | ICD-10-CM

## 2024-08-02 LAB — CBC WITH DIFFERENTIAL/PLATELET
Basophils Absolute: 0 K/uL (ref 0.0–0.1)
Basophils Relative: 2 %
Eosinophils Absolute: 0 K/uL (ref 0.0–0.5)
Eosinophils Relative: 2 %
HCT: 35.8 % — ABNORMAL LOW (ref 39.0–52.0)
Hemoglobin: 12.3 g/dL — ABNORMAL LOW (ref 13.0–17.0)
Lymphocytes Relative: 38 %
Lymphs Abs: 0.9 K/uL (ref 0.7–4.0)
MCH: 29.8 pg (ref 26.0–34.0)
MCHC: 34.4 g/dL (ref 30.0–36.0)
MCV: 86.7 fL (ref 80.0–100.0)
Monocytes Absolute: 0.1 K/uL (ref 0.1–1.0)
Monocytes Relative: 5 %
Neutro Abs: 1.2 K/uL — ABNORMAL LOW (ref 1.7–7.7)
Neutrophils Relative %: 53 %
Platelets: 79 K/uL — ABNORMAL LOW (ref 150–400)
RBC: 4.13 MIL/uL — ABNORMAL LOW (ref 4.22–5.81)
RDW: 21.1 % — ABNORMAL HIGH (ref 11.5–15.5)
WBC: 2.3 K/uL — ABNORMAL LOW (ref 4.0–10.5)
nRBC: 0 % (ref 0.0–0.2)

## 2024-08-02 LAB — SODIUM, URINE, RANDOM: Sodium, Ur: 57 mmol/L

## 2024-08-02 LAB — BASIC METABOLIC PANEL WITH GFR
Anion gap: 11 (ref 5–15)
BUN: 5 mg/dL — ABNORMAL LOW (ref 6–20)
CO2: 23 mmol/L (ref 22–32)
Calcium: 9.1 mg/dL (ref 8.9–10.3)
Chloride: 98 mmol/L (ref 98–111)
Creatinine, Ser: 0.55 mg/dL — ABNORMAL LOW (ref 0.61–1.24)
GFR, Estimated: >60 mL/min (ref >60)
Glucose, Bld: 97 mg/dL (ref 70–99)
Potassium: 3.4 mmol/L — ABNORMAL LOW (ref 3.5–5.1)
Sodium: 132 mmol/L — ABNORMAL LOW (ref 135–145)

## 2024-08-02 LAB — OSMOLALITY: Osmolality: 278 mosm/kg (ref 275–295)

## 2024-08-02 LAB — PHOSPHORUS: Phosphorus: 4.6 mg/dL (ref 2.5–4.6)

## 2024-08-02 LAB — PATHOLOGIST SMEAR REVIEW: Path Review: NEGATIVE

## 2024-08-02 LAB — HIV-1 RNA QUANT-NO REFLEX-BLD
HIV 1 RNA Quant: 152000 {copies}/mL
LOG10 HIV-1 RNA: 5.182 {Log_copies}/mL

## 2024-08-02 LAB — MAGNESIUM: Magnesium: 1.9 mg/dL (ref 1.7–2.4)

## 2024-08-02 LAB — T-HELPER CELLS (CD4) COUNT (NOT AT ARMC)
CD4 % Helper T Cell: 33 % (ref 33–65)
CD4 T Cell Abs: 182 /uL — ABNORMAL LOW (ref 400–1790)

## 2024-08-02 LAB — OSMOLALITY, URINE: Osmolality, Ur: 649 mosm/kg (ref 300–900)

## 2024-08-02 MED ORDER — DOLUTEGRAVIR SODIUM 50 MG PO TABS
50.0000 mg | ORAL_TABLET | Freq: Two times a day (BID) | ORAL | Status: DC
  Administered 2024-08-03: 50 mg via ORAL
  Filled 2024-08-02 (×2): qty 1

## 2024-08-02 MED ORDER — EMTRICITABINE-TENOFOVIR DF 200-300 MG PO TABS
1.0000 | ORAL_TABLET | Freq: Every day | ORAL | Status: DC
  Administered 2024-08-03: 1 via ORAL
  Filled 2024-08-02: qty 1

## 2024-08-02 MED ORDER — SULFAMETHOXAZOLE-TRIMETHOPRIM 800-160 MG PO TABS
1.0000 | ORAL_TABLET | ORAL | Status: DC
  Administered 2024-08-02: 1 via ORAL
  Filled 2024-08-02 (×2): qty 1

## 2024-08-02 MED ORDER — EMTRICITABINE-TENOFOVIR DF 200-300 MG PO TABS
1.0000 | ORAL_TABLET | Freq: Every day | ORAL | Status: DC
  Filled 2024-08-02: qty 1

## 2024-08-02 MED ORDER — OXYCODONE HCL 5 MG PO TABS
5.0000 mg | ORAL_TABLET | Freq: Once | ORAL | Status: AC
  Administered 2024-08-02: 5 mg via ORAL
  Filled 2024-08-02: qty 1

## 2024-08-02 MED ORDER — POTASSIUM CHLORIDE CRYS ER 20 MEQ PO TBCR
40.0000 meq | EXTENDED_RELEASE_TABLET | Freq: Two times a day (BID) | ORAL | Status: AC
  Administered 2024-08-02 (×2): 40 meq via ORAL
  Filled 2024-08-02 (×2): qty 2

## 2024-08-02 MED ORDER — POLYETHYLENE GLYCOL 3350 17 G PO PACK
17.0000 g | PACK | Freq: Every day | ORAL | Status: DC
  Filled 2024-08-02: qty 1

## 2024-08-02 MED ORDER — DOLUTEGRAVIR SODIUM 50 MG PO TABS
50.0000 mg | ORAL_TABLET | Freq: Two times a day (BID) | ORAL | Status: DC
  Filled 2024-08-02 (×2): qty 1

## 2024-08-02 MED ORDER — THIAMINE MONONITRATE 100 MG PO TABS
500.0000 mg | ORAL_TABLET | Freq: Three times a day (TID) | ORAL | Status: DC
  Administered 2024-08-02 – 2024-08-03 (×4): 500 mg via ORAL
  Filled 2024-08-02 (×4): qty 5

## 2024-08-02 NOTE — Progress Notes (Signed)
 Patient has pulled out his IV access. States If they aren't going to give me anything in there to help me then I don't need it

## 2024-08-02 NOTE — Progress Notes (Signed)
 At [phone] patient was very agitated and stating that he was leaving. Explained that he cannot leave and that he just came in last night. States that he knows he has been here at least five days. Cannot remember the date or what hospital he is in at this time. Gave 3mg  of ativan  per CIWA guidelines. Tried to get patient to allow me to put in a new IV line since he had pulled his out earlier. He stated that he would only allow that if I was going to give him dilaudid . Provider is aware.

## 2024-08-02 NOTE — Progress Notes (Signed)
 Patient is in bed, continues awake and states that he is still in severe pain in his abdomen and head. He is not quite as agitated as he was earlier, however he will not allow orthostatic b/p's. Did allow the sitter to get a blood pressure while he was lying in bed. 109/78 CIWA now at 8

## 2024-08-02 NOTE — Progress Notes (Signed)
   08/02/24 1300  CIWA-Ar  BP 105/71  Pulse Rate 75  Nausea and Vomiting 0  Tactile Disturbances 0  Tremor 0  Auditory Disturbances 0  Paroxysmal Sweats 0  Visual Disturbances 0  Anxiety 2  Headache, Fullness in Head 1  Agitation 2  Orientation and Clouding of Sensorium 1  CIWA-Ar Total 6

## 2024-08-02 NOTE — Plan of Care (Signed)
  Problem: Education: Goal: Knowledge of General Education information will improve Description: Including pain rating scale, medication(s)/side effects and non-pharmacologic comfort measures Outcome: Not Progressing   Problem: Health Behavior/Discharge Planning: Goal: Ability to manage health-related needs will improve Outcome: Not Progressing   Problem: Clinical Measurements: Goal: Ability to maintain clinical measurements within normal limits will improve Outcome: Not Progressing Goal: Will remain free from infection Outcome: Not Progressing   Problem: Nutrition: Goal: Adequate nutrition will be maintained Outcome: Not Progressing   Problem: Coping: Goal: Level of anxiety will decrease Outcome: Not Progressing   Problem: Pain Managment: Goal: General experience of comfort will improve and/or be controlled Outcome: Not Progressing   Problem: Safety: Goal: Ability to remain free from injury will improve Outcome: Not Progressing

## 2024-08-02 NOTE — Progress Notes (Signed)
 2020: Messaged by the nurse that patient is again experiencing 10/10 severe abdominal pain. Went to examine patient at bedside. On entry to the room, patient is resting comfortably in the bed in no acute distress, with 1:1 sitter at bedside. He states that the pain is unchanged from last night. Pain is still confined to his peri-umbilical area and is the same pain he has experienced for the past year. He has been drinking water  well today. He thinks that he has not had a BM today. Is still experiencing some nausea. Physical exam abdomen soft, non-distended, tender to palpation periumbilical region, otherwise nontender in all other quadrants, bowel sounds appropriate to auscultation in all quadrants.  Plan:  - Miralax  prn  - Oxycodone  5 mg once - Tylenol  650 mg q6 hrs prn pn   Doyal Miyamoto, MD [facility] Internal Medicine  PGY-1

## 2024-08-02 NOTE — Progress Notes (Signed)
Urine sample colleted and sent to lab

## 2024-08-02 NOTE — Progress Notes (Signed)
Pt refused morning CBG.

## 2024-08-02 NOTE — Progress Notes (Signed)
 HD#1 SUBJECTIVE:  Patient Summary: Gary [name] is a 38 y.o. with a past medical history of HIV, severe alcohol  use disorder, MDD, and chronic pancreatitis presents with SI during acute intoxication and admitted for monitored alcohol  withdrawal. Patient is currently under IVC.   Overnight Events: Patient was experiencing 10/10 abdominal pain. When night team evaluated, the patient was resting comfortably in bed in no acute distress, and had no pain on physical exam. He was given Tylenol  650mg  for pain and Zofran  4mg  for nausea. Please see night note from Dr. Nguyen for further details.   Interim History: Patient evaluated at bedside, where he was gently woken up.  Patient expressed frustration about inpatient status and his pancreatitis not being assessed.  When asked if he knew what he was hospitalized for, he had to be reminded that it was for alcohol  detox and withdrawal management.  OBJECTIVE:  Vital Signs: Vitals:   08/02/24 0603 08/02/24 0720 08/02/24 0833 08/02/24 1300  BP: 109/78 107/78 107/78 105/71  Pulse: 84 100 100 75  Resp: 18 18    Temp: 98.3 F (36.8 C) 98.3 F (36.8 C)    TempSrc: Oral Oral    SpO2: 99% 100%     Supplemental O2: Room Air SpO2: 100 %  There were no vitals filed for this visit.  No intake or output data in the 24 hours ending 08/02/24 1503 Net IO Since Admission: 1,048.28 mL [08/02/24 1503]  Physical Exam: Physical Exam Constitutional:      Appearance: He is ill-appearing.     Comments: Cachetic appearing  HENT:     Mouth/Throat:     Mouth: Mucous membranes are dry.  Cardiovascular:     Rate and Rhythm: Tachycardia present.     Pulses: Normal pulses.  Pulmonary:     Effort: Pulmonary effort is normal. No respiratory distress.     Breath sounds: Normal breath sounds.  Abdominal:     General: Abdomen is flat. Bowel sounds are normal.     Palpations: Abdomen is soft.  Musculoskeletal:        General: Normal range of motion.      Cervical back: Normal range of motion.     Comments: Tremoring  Skin:    General: Skin is warm and dry.  Neurological:     General: No focal deficit present.     Mental Status: He is oriented to person, place, and time.  Psychiatric:     Comments: Agitated. No SI/HI     Patient Lines/Drains/Airways Status     Active Line/Drains/Airways     Name Placement date Placement time Site Days   Peripheral IV 07/31/24 20 G 2.5 Left;Upper Arm 07/31/24  1946  Arm  2            Pertinent labs and imaging:     Latest Ref Rng & Units 08/02/2024    3:30 AM 07/31/2024    5:58 PM 07/24/2024    9:09 PM  CBC  WBC 4.0 - 10.5 K/uL 2.3  1.8  3.5   Hemoglobin 13.0 - 17.0 g/dL 87.6  88.7  88.9   Hematocrit 39.0 - 52.0 % 35.8  34.1  33.9   Platelets 150 - 400 K/uL 79  82  57        Latest Ref Rng & Units 08/02/2024    3:30 AM 07/31/2024    5:58 PM 07/24/2024    9:09 PM  CMP  Glucose 70 - 99 mg/dL 97  94  95   BUN 6 - 20 mg/dL 5  <5  <5   Creatinine 0.61 - 1.24 mg/dL 9.44  9.57  9.55   Sodium 135 - 145 mmol/L 132  140  138   Potassium 3.5 - 5.1 mmol/L 3.4  3.4  3.9   Chloride 98 - 111 mmol/L 98  101  101   CO2 22 - 32 mmol/L 23  26  23    Calcium  8.9 - 10.3 mg/dL 9.1  7.9  8.7   Total Protein 6.5 - 8.1 g/dL  6.5  6.8   Total Bilirubin 0.0 - 1.2 mg/dL  0.4  0.4   Alkaline Phos 38 - 126 U/L  54  56   AST 15 - 41 U/L  51  85   ALT 0 - 44 U/L  33  47     No results found.  ASSESSMENT/PLAN:  Assessment: Principal Problem:   Alcohol  withdrawal (HCC) Active Problems:   HIV disease (HCC)   Alcohol  use disorder, severe, dependence (HCC)   Severe episode of recurrent major depressive disorder, without psychotic features (HCC)   Alcohol  intoxication with delirium   Severe neutropenia   Pancytopenia (HCC)   Seizure disorder (HCC)   Chronic pancreatitis (HCC)   Generalized anxiety disorder   Hyponatremia  Gary [name] is a 38 y.o. with history of HIV and severe alcohol  use disorder  presents with SI during acute intoxication and admitted for monitored alcohol  withdrawal.   Plan: #Alcohol  withdrawal (HCC) Clinically stable now. CIWA max of 18 overnight, requiring multiple rounds of Ativan . CIWA improving, currently 6. Phenobarbital  taper started in ED, will continue this. He also takes gabapentin  outside of hospital for maintenance of abstinence from alcohol . History of severe withdrawal with seizure and DT, he's at high risk for this again.  Given IVC status, will discuss with psychiatry regarding the need for continued IVC. - CIWA monitoring - Phenobarbital  taper - Ativan  1 mg po q6 h prn for anxiety and sleep - Continue home gabapentin  100 mg tid - Discussion with psychiatry  #AIDS #HIV Disease (HCC) AIDs diagnosis new. Chronically with HIV, follows with [facility], on Biktarvy  with reasonable adherence in the past.  Due to phenobarb taper, changing ART to TIVICAY  50mg  BID to accommodate.  CD4 count low at 182, with HIV 1 RNA quant at 152,000.  Placed on Bactrim  prophylaxis for PJP - Continue Tivicay  - Bactrim  800-160 MG, once every Monday Wednesday Friday    #Alcohol  use disorder, severe, dependence (HCC) Chronic. Motivated to be abstinent. At least two 5th of liquor or equivalent daily. Ethanol level was 378 on presentation to ED. He has history of severe withdrawal. Malnourished. CIWA peaked to 18 last night. Patient was started high dose thiamine  for Wernicke prophylaxis, however patient removed his IV line. Switched IV thiamine  to PO thiamine  500mg . - Continue thiamine  500mg  - Continue folate 1mg     #Hyponatremia Sodium this morning 132, down from 140 yesterday. Potentially due to potomania vs SIADH. Will order hyponatremia workup to appropriately treat. - Urine Osmol - Urine Na - Serum Osmol  #Hypokalemia Found to be 3.4 this morning. Will replenish. - Potassium chloride  40mEq tablet   #Severe episode of recurrent major depressive disorder, without psychotic  features (HCC)  #Generalized anxiety disorder Likely substance induced, presented with SI/HI initially but this has resolved. Psychiatry is following.      #Pancytopenia (HCC)  #Severe neutropenia History of neutropenia, but now with ANC 1200. Afebrile. Probably sequelae of alcohol  use, malnutrition,  chronic HIV disease. -Monitor CBC w/ diff.    #Seizure disorder (HCC) Chronic, probably in setting of chronic alcohol  use and multiple severe episodes of withdrawal. Will continue outpatient Keppra . -Keppra  500 mg bid    #Chronic pancreatitis (HCC) Due to chronic alcohol  use disorder. On outpatient pancreatic enzyme supplementation. - Continue Creon  TID  Best Practice: Diet: Regular diet VTE: enoxaparin  (LOVENOX ) injection 40 mg Start: 08/01/24 1230 Code: Full  Disposition planning: Therapy Recs: Home Family Contact: brother, [place] DISPO: Anticipated discharge in 2-3 days to Rehab pending clinical improvement.  Signature:  Janayia Burggraf, DO Internal Medicine Resident, PGY-1 Please contact the on call pager at [phone] for any urgent or emergent needs. 3:03 PM 08/02/2024

## 2024-08-02 NOTE — Progress Notes (Signed)
   08/02/24 0720  Vitals  Temp 98.3 F (36.8 C)  Temp Source Oral  BP 107/78  MAP (mmHg) 85  Pulse Rate 100  Pulse Rate Source Dinamap  Resp 18  Level of Consciousness  Level of Consciousness Alert  MEWS COLOR  MEWS Score Color Green  Oxygen Therapy  SpO2 100 %  O2 Device Room Air  MEWS Score  MEWS Temp 0  MEWS Systolic 0  MEWS Pulse 0  MEWS RR 0  MEWS LOC 0  MEWS Score 0

## 2024-08-03 ENCOUNTER — Telehealth (HOSPITAL_COMMUNITY): Payer: Self-pay | Admitting: Pharmacy Technician

## 2024-08-03 ENCOUNTER — Other Ambulatory Visit (HOSPITAL_COMMUNITY): Payer: Self-pay

## 2024-08-03 DIAGNOSIS — E871 Hypo-osmolality and hyponatremia: Secondary | ICD-10-CM | POA: Diagnosis not present

## 2024-08-03 DIAGNOSIS — B20 Human immunodeficiency virus [HIV] disease: Secondary | ICD-10-CM | POA: Diagnosis not present

## 2024-08-03 DIAGNOSIS — D709 Neutropenia, unspecified: Secondary | ICD-10-CM | POA: Diagnosis not present

## 2024-08-03 DIAGNOSIS — E876 Hypokalemia: Secondary | ICD-10-CM | POA: Diagnosis not present

## 2024-08-03 LAB — CBC WITH DIFFERENTIAL/PLATELET
Abs Immature Granulocytes: 0.01 K/uL (ref 0.00–0.07)
Basophils Absolute: 0 K/uL (ref 0.0–0.1)
Basophils Relative: 1 %
Eosinophils Absolute: 0.1 K/uL (ref 0.0–0.5)
Eosinophils Relative: 3 %
HCT: 37.9 % — ABNORMAL LOW (ref 39.0–52.0)
Hemoglobin: 12.7 g/dL — ABNORMAL LOW (ref 13.0–17.0)
Immature Granulocytes: 0 %
Lymphocytes Relative: 38 %
Lymphs Abs: 0.9 K/uL (ref 0.7–4.0)
MCH: 29.7 pg (ref 26.0–34.0)
MCHC: 33.5 g/dL (ref 30.0–36.0)
MCV: 88.6 fL (ref 80.0–100.0)
Monocytes Absolute: 0.3 K/uL (ref 0.1–1.0)
Monocytes Relative: 14 %
Neutro Abs: 1 K/uL — ABNORMAL LOW (ref 1.7–7.7)
Neutrophils Relative %: 44 %
Platelets: 81 K/uL — ABNORMAL LOW (ref 150–400)
RBC: 4.28 MIL/uL (ref 4.22–5.81)
RDW: 20.9 % — ABNORMAL HIGH (ref 11.5–15.5)
WBC: 2.3 K/uL — ABNORMAL LOW (ref 4.0–10.5)
nRBC: 0 % (ref 0.0–0.2)

## 2024-08-03 LAB — COMPREHENSIVE METABOLIC PANEL WITH GFR
ALT: 24 U/L (ref 0–44)
AST: 27 U/L (ref 15–41)
Albumin: 3.4 g/dL — ABNORMAL LOW (ref 3.5–5.0)
Alkaline Phosphatase: 60 U/L (ref 38–126)
Anion gap: 10 (ref 5–15)
BUN: 7 mg/dL (ref 6–20)
CO2: 22 mmol/L (ref 22–32)
Calcium: 9.4 mg/dL (ref 8.9–10.3)
Chloride: 98 mmol/L (ref 98–111)
Creatinine, Ser: 0.69 mg/dL (ref 0.61–1.24)
GFR, Estimated: >60 mL/min (ref >60)
Glucose, Bld: 96 mg/dL (ref 70–99)
Potassium: 4 mmol/L (ref 3.5–5.1)
Sodium: 130 mmol/L — ABNORMAL LOW (ref 135–145)
Total Bilirubin: 0.8 mg/dL (ref 0.0–1.2)
Total Protein: 6.7 g/dL (ref 6.5–8.1)

## 2024-08-03 LAB — GLUCOSE, CAPILLARY: Glucose-Capillary: 78 mg/dL (ref 70–99)

## 2024-08-03 MED ORDER — SULFAMETHOXAZOLE-TRIMETHOPRIM 800-160 MG PO TABS
1.0000 | ORAL_TABLET | ORAL | 0 refills | Status: DC
  Filled 2024-08-03: qty 12, 28d supply, fill #0

## 2024-08-03 MED ORDER — OXYCODONE HCL 5 MG PO TABS: 5.0000 mg | ORAL_TABLET | Freq: Once | ORAL | Status: DC

## 2024-08-03 MED ORDER — HYDROMORPHONE HCL 2 MG PO TABS
2.0000 mg | ORAL_TABLET | Freq: Once | ORAL | Status: AC
  Administered 2024-08-03: 2 mg via ORAL
  Filled 2024-08-03: qty 1

## 2024-08-03 MED ORDER — NALTREXONE HCL 50 MG PO TABS
50.0000 mg | ORAL_TABLET | Freq: Every day | ORAL | 0 refills | Status: DC
  Filled 2024-08-03: qty 30, 30d supply, fill #0

## 2024-08-03 MED ORDER — ONDANSETRON HCL 4 MG PO TABS
4.0000 mg | ORAL_TABLET | Freq: Three times a day (TID) | ORAL | 0 refills | Status: DC | PRN
  Filled 2024-08-03: qty 20, 7d supply, fill #0

## 2024-08-03 MED ORDER — PHENOBARBITAL 32.4 MG PO TABS
ORAL_TABLET | ORAL | 0 refills | Status: DC
  Filled 2024-08-03: qty 18, 4d supply, fill #0

## 2024-08-03 MED ORDER — EMTRICITABINE-TENOFOVIR DF 200-300 MG PO TABS
1.0000 | ORAL_TABLET | Freq: Every day | ORAL | 0 refills | Status: DC
  Filled 2024-08-03: qty 30, 30d supply, fill #0

## 2024-08-03 MED ORDER — DOLUTEGRAVIR SODIUM 50 MG PO TABS
50.0000 mg | ORAL_TABLET | Freq: Two times a day (BID) | ORAL | 0 refills | Status: DC
  Filled 2024-08-03: qty 60, 30d supply, fill #0

## 2024-08-03 NOTE — Progress Notes (Signed)
 Pt refused oxycodone . States he will only take dilaudid .

## 2024-08-03 NOTE — Discharge Summary (Signed)
 Name: Gary [name] MRN: [id number] DOB: [birth year] 38 y.o. PCP: Patient, No Pcp Per  Date of Admission: 07/31/2024 12:02 PM Date of Discharge: 08/03/2024  Attending Physician: Dr. Reyes Fenton  Discharge Diagnosis: Principal Problem:   Alcohol  withdrawal (HCC) Active Problems:   HIV disease (HCC)   Alcohol  use disorder, severe, dependence (HCC)   Severe episode of recurrent major depressive disorder, without psychotic features (HCC)   Alcohol  intoxication with delirium   Severe neutropenia   Pancytopenia (HCC)   Seizure disorder (HCC)   Chronic pancreatitis (HCC)   Generalized anxiety disorder   Hyponatremia  AIDS Hyponatremia Hypokalemia, resolved Hypoalbuminemia   Discharge Medications: Allergies as of 08/03/2024       Reactions   Risperidone  And Paliperidone Other (See Comments)   Patient states causes opposite effect (he will not take again), and gave him psychotic thoughts   Tegretol  [carbamazepine ] Other (See Comments)   Vertigo  Paralysis   Caffeine Palpitations        Medication List     PAUSE taking these medications    Biktarvy  50-200-25 MG Tabs tablet Wait to take this until your doctor or other care provider tells you to start again. Generic drug: bictegravir-emtricitabine -tenofovir  AF Take 1 tablet by mouth daily.       TAKE these medications    Creon  36000-114000 units Cpep capsule Generic drug: lipase/protease/amylase Take 1 capsule (36,000 Units total) by mouth 3 (three) times daily before meals.   dolutegravir  50 MG tablet Commonly known as: TIVICAY  Take 1 tablet (50 mg total) by mouth 2 (two) times daily.   emtricitabine -tenofovir  200-300 MG tablet Commonly known as: TRUVADA  Take 1 tablet by mouth daily. Start taking on: August 04, 2024   folic acid  1 MG tablet Commonly known as: FOLVITE  Take 1 tablet (1 mg total) by mouth daily.   gabapentin  100 MG capsule Commonly known as: NEURONTIN  Take 1 capsule (100 mg total)  by mouth at bedtime. What changed: when to take this   levETIRAcetam  500 MG tablet Commonly known as: KEPPRA  Take 1 tablet (500 mg total) by mouth 2 (two) times daily.   naltrexone  50 MG tablet Commonly known as: DEPADE Take 1 tablet (50 mg total) by mouth daily.   ondansetron  4 MG tablet Commonly known as: ZOFRAN  Take 1 tablet (4 mg total) by mouth every 8 (eight) hours as needed for nausea or vomiting.   pantoprazole  40 MG tablet Commonly known as: PROTONIX  Take 1 tablet (40 mg total) by mouth daily.   PHENobarbital  32.4 MG tablet Commonly known as: LUMINAL Take 2 tablets (64.8 mg total) by mouth every 8 (eight) hours for 2 days, THEN 1 tablet (32.4 mg total) every 8 (eight) hours for 2 days. Start taking on: August 03, 2024   sulfamethoxazole -trimethoprim  800-160 MG tablet Commonly known as: BACTRIM  DS Take 1 tablet by mouth every Monday, Wednesday, and Friday. Start taking on: August 04, 2024   thiamine  100 MG tablet Commonly known as: VITAMIN B1 Take 1 tablet (100 mg total) by mouth daily.        Disposition and follow-up:   Gary [name] was discharged from [facility] in Stable condition.  At the hospital follow up visit please address:  1.  Follow-up:  a. Patient's adherence to Luminal taper and Naltrexone  for alcohol  abstinence and reduction.    b. Patient is to follow up with infectious disease on his new AIDS diagnosis with a CD4 count of 182. He has an appointment on  10/8 with his current ID doctor to restart on Biktarvy  after his Phenobarb taper.    c. CMP to follow his hyponatremia. Showed no signs of ODS while inpatient, but Na+ continued to decrease despite oral hydration and food intake. Consider repeating osmolalities and urine sodium.  2.  Labs / imaging needed at time of follow-up: CBC, CMP, CD4 count, potential uOsmol, serum Osmol, and uNa.   Follow-up Appointments:  Follow-up Information     Kotturi, Vinay K, MD  Follow up.   Specialty: Family Medicine Why: TIME : 1:20 PM   PLEAE ARRIVE AT 1:00 PM DATE:OCTOBER [id number]  TUESDAY  PLEASE BRING ALL CURRENT MEDICATION, ID and INS CARD Contact information: [address] [place] [address] [phone]                 Hospital Course by problem list: Gary [name] is a 38 y.o. with history of HIV and severe alcohol  use disorder presents with SI during acute intoxication and admitted for monitored alcohol  withdrawal.     Plan: #Alcohol  withdrawal (HCC) Clinically stable now. CIWA max of 18 while inpatient, eventually lowered to 5 by discharge. Patient requiring multiple rounds of Ativan  to improve symptoms. Phenobarbital  taper started in ED, will continue this. He also takes gabapentin  outside of hospital for maintenance of abstinence from alcohol . History of severe withdrawal with seizure and DT, he's at high risk for this again.  Given IVC status, psychiatry was consulted and rescinded his IVC hold. Patient expressed initial interest in abstinence from alcohol , including staying with his brother in [place] (who does not drink), attending Alcoholic Anonymous, and starting an agent such as Acamprosate or Naltrexone  to reduce alcohol  craving. However, he had multiple reactionary decisions to revoke his decision about quitting when his pain surrounding his chronic pancreatitis became increasingly problematic. After extensive discussions about how alcohol  worsens his pancreatitis pain, how Naltrexone  can quiet the alcohol  cravings he has, and how phenobarbital  taper is assisting with his withdrawal symptoms currently, the patient agreed to utilize these resources available to him to remain abstinent from alcohol . Patient was set up with PCP in [place], close to his brother's house, to help with Naltrexone  refills, and better accountability.    #AIDS #HIV Disease (HCC) AIDs diagnosis new. Chronically with HIV, follows with [facility] at Dr. Constance  Vu's office, on Biktarvy  with reasonable adherence in the past.  Due to phenobarb taper, changing ART to TIVICAY  50mg  BID and Truvuda 200-300mg  daily to accommodate.  CD4 count low at 182, with HIV 1 RNA quant at 152,000.  Placed on Bactrim  800-160 MG, once every Monday Wednesday Friday for prophylaxis for PJP. Will discharge on Truvada  200-300mg  daily and Tivicay  50mg  twice daily until phenobarb taper is complete. Emphasized importance to patient to follow up with ID given new diagnosis. Patient voiced understanding.      #Alcohol  use disorder, severe, dependence (HCC) Chronic. Motivated to be abstinent. At least two 5th of liquor or equivalent daily. Ethanol level was 378 on presentation to ED. He has history of severe withdrawal. Malnourished. CIWA peaked to 18 last night. Patient was started high dose thiamine  for Wernicke prophylaxis, however patient removed his IV line. Switched IV thiamine  to PO thiamine  500mg  and Folate 1mg .   #Hyponatremia Sodium found to be 132, down from 140 on admission. Potentially due to potomania vs SIADH. Sodium continuing to decrease to 130. Urine Osmol 649, Urine Na 57, Serum Osmol 278, suggesting possible pseudohyponatremia vs SIADH. Closely monitored while inpatient.  Patient AxOx4, no signs of lethargy or obtundation. PO intake is less than usual given patient's abdominal pain, but able to keep down PO fluids and food. Needs to be followed up outpatient.    #Hypokalemia, resolved 3.4 yesterday. Gave 40mEq tablet, now 4.0.     #Severe episode of recurrent major depressive disorder, without psychotic features (HCC)  #Generalized anxiety disorder Likely substance induced, presented with SI/HI initially but this has resolved. Psychiatry followed and rescinded IVC status.      #Pancytopenia (HCC)  #Severe neutropenia History of neutropenia, but now with ANC 1200. Afebrile. Probably sequelae of alcohol  use, malnutrition, chronic HIV disease. Monitored CBC w/ diff.     #Seizure disorder (HCC) Chronic, probably in setting of chronic alcohol  use and multiple severe episodes of withdrawal. Continued Keppra  500 mg bid.    #Chronic pancreatitis (HCC) Due to chronic alcohol  use disorder. On outpatient pancreatic enzyme supplementation. Continued Creon  TID.   Discharge Subjective: Patient is lying in bed in no acute distress. He mentions painful peri-umbilical discomfort that is chronic, secondary to his chronic pancreatitis. He would like to leave the hospital once he is able to get his pain medicine. He will be returning to his brother's house in [place]. He is ready to be discharged today.  Discharge Exam:   BP 96/78 (BP Location: Left Arm)   Pulse 73   Temp 97.9 F (36.6 C) (Oral)   Resp 16   SpO2 100%  Constitutional: thin, ill-appearing male laying in bed, in no acute distress HENT: normocephalic atraumatic, mucous membranes moist Eyes: conjunctiva non-erythematous Neck: supple Cardiovascular: regular rate and rhythm, no m/r/g Pulmonary/Chest: normal work of breathing on room air, lungs clear to auscultation bilaterally Abdominal: soft, non-tender, non-distended, tender to palpation on umbilical region Neurological: alert & oriented x 3, 5/5 strength in bilateral upper and lower extremities Skin: warm and dry Psych: agitated mood and affect, circumferential thought process back to pain surrounding his pancreatitis   Pertinent Labs, Studies, and Procedures:     Latest Ref Rng & Units 08/03/2024    3:53 AM 08/02/2024    3:30 AM 07/31/2024    5:58 PM  CBC  WBC 4.0 - 10.5 K/uL 2.3  2.3  1.8   Hemoglobin 13.0 - 17.0 g/dL 87.2  87.6  88.7   Hematocrit 39.0 - 52.0 % 37.9  35.8  34.1   Platelets 150 - 400 K/uL 81  79  82        Latest Ref Rng & Units 08/03/2024    3:53 AM 08/02/2024    3:30 AM 07/31/2024    5:58 PM  CMP  Glucose 70 - 99 mg/dL 96  97  94   BUN 6 - 20 mg/dL 7  5  <5   Creatinine 9.38 - 1.24 mg/dL 9.30  9.44  9.57   Sodium 135 - 145  mmol/L 130  132  140   Potassium 3.5 - 5.1 mmol/L 4.0  3.4  3.4   Chloride 98 - 111 mmol/L 98  98  101   CO2 22 - 32 mmol/L 22  23  26    Calcium  8.9 - 10.3 mg/dL 9.4  9.1  7.9   Total Protein 6.5 - 8.1 g/dL 6.7   6.5   Total Bilirubin 0.0 - 1.2 mg/dL 0.8   0.4   Alkaline Phos 38 - 126 U/L 60   54   AST 15 - 41 U/L 27   51   ALT 0 - 44 U/L 24  33     CT Maxillofacial Wo Contrast Result Date: 07/31/2024 CLINICAL DATA:  Facial trauma, blunt EXAM: CT MAXILLOFACIAL WITHOUT CONTRAST TECHNIQUE: Multidetector CT imaging of the maxillofacial structures was performed. Multiplanar CT image reconstructions were also generated. RADIATION DOSE REDUCTION: This exam was performed according to the departmental dose-optimization program which includes automated exposure control, adjustment of the mA and/or kV according to patient size and/or use of iterative reconstruction technique. COMPARISON:  None Available. FINDINGS: Osseous: No fracture or mandibular dislocation. No destructive process. Orbits: No fracture or orbital emphysema.  Globes are intact. Sinuses: Clear Soft tissues: Negative Limited intracranial: See head CT report IMPRESSION: No facial or orbital fracture. Electronically Signed   By: Franky Crease M.D.   On: 07/31/2024 18:13   CT Cervical Spine Wo Contrast Result Date: 07/31/2024 CLINICAL DATA:  Neck trauma, intoxicated or obtunded EXAM: CT CERVICAL SPINE WITHOUT CONTRAST TECHNIQUE: Multidetector CT imaging of the cervical spine was performed without intravenous contrast. Multiplanar CT image reconstructions were also generated. RADIATION DOSE REDUCTION: This exam was performed according to the departmental dose-optimization program which includes automated exposure control, adjustment of the mA and/or kV according to patient size and/or use of iterative reconstruction technique. COMPARISON:  None Available. FINDINGS: Alignment: No subluxation. Skull base and vertebrae: No acute fracture. No primary  bone lesion or focal pathologic process. Soft tissues and spinal canal: No prevertebral fluid or swelling. No visible canal hematoma. Disc levels: Early degenerative facet disease bilaterally. Disc spaces maintained. Upper chest: No acute findings Other: None IMPRESSION: No acute bony abnormality. Electronically Signed   By: Franky Crease M.D.   On: 07/31/2024 18:12   CT Head Wo Contrast Result Date: 07/31/2024 CLINICAL DATA:  Head trauma, abnormal mental status (Age 40-64y) EXAM: CT HEAD WITHOUT CONTRAST TECHNIQUE: Contiguous axial images were obtained from the base of the skull through the vertex without intravenous contrast. RADIATION DOSE REDUCTION: This exam was performed according to the departmental dose-optimization program which includes automated exposure control, adjustment of the mA and/or kV according to patient size and/or use of iterative reconstruction technique. COMPARISON:  05/02/2024 FINDINGS: Brain: No acute intracranial abnormality. Specifically, no hemorrhage, hydrocephalus, mass lesion, acute infarction, or significant intracranial injury. Vascular: No hyperdense vessel or unexpected calcification. Skull: No acute calvarial abnormality. Sinuses/Orbits: No acute findings Other: None IMPRESSION: No acute intracranial abnormality. Electronically Signed   By: Franky Crease M.D.   On: 07/31/2024 18:08     Discharge Instructions:   Discharge Instructions      Gary [name], Gary [name] were recently admitted to [facility] for alcohol  intoxication and your withdrawals. You were placed on a medication taper to help reduce your cravings and control your withdrawal symptoms from becoming harmful to your body.    Continue taking your home medications with the following changes:  Start taking: Naltrexone  50mg , once daily. This is the medication that will quiet the alcohol  cravings you have. It may make you nauseous, but it is not guaranteed. To prevent this, take the medication in the  morning with food.  Bactrim  double strength 800-160 mg tablet.  Take 1 pill every Monday Wednesday and Friday.  This is an antibiotic that will help prevent infections while your immune system is low.  Please continue to take this medicine, and please take this medicine with food.  It is incredibly important that you follow-up with Dr. Constance in the infectious disease clinic to make sure that your immune system improves. Zofran  4 mg tablet.  This  is a medication for nausea.  You can take it every 8 hours as needed for nausea or vomiting. Luminal 32.4mg  tablet.  Take 2 tablets every 8 hours both today (9/25) and tomorrow (9/26).  Then on 9/27 and 9/28, take 1 tablet every 8 hours until you finish the bottle.  This was the medicine taper you were on to help with the alcohol  detox.  Be careful not to stop this medicine abruptly, as it can increase anxiety, cause tremors, or worse-induce seizures or cause death. Truvada  200-300 mg tablet, once daily. This is a medicine for HIV while you take the Luminal taper. Tivicay  50 mg tablet, 2 times daily. This is a medicine for HIV while you take the Luminal taper. Pause taking: Biktarvy  50-200-25mg  tablets. While you are on the Luminal tablets, your Biktarvy  will not be as effective to help treat your HIV. We will pause this medicine for now, until you finish that Luminal taper. We cannot emphasize enough the importance of meeting with your infectious disease doctor to know when to restart the Biktarvy . Continue taking: Creon , three times daily for your pancreatitis Folic acid  1 mg tablet daily Gabapentin  100 mg capsule at bedtime Keppra  500 mg tablet, twice daily Protonix  40 mg tablet daily Vitamin B1 100 mg tablet daily   You should seek further medical care if you or a friend/family member notice any confusion or mental changes, you become increasingly shaky, or you begin to start to have fevers  >101 degrees.  We recommend that you see your primary care  doctor in about a week to make sure that you continue to improve. Please call and establish with a new primary care doctor as soon as you are able. We also recommend you follow up with infectious disease about your Biktarvy  and making sure your immune system improves. We have set up an appointment for you to be seen by Dr. Overton on 08/16/2024 at 9:15AM.  We are so glad that you are feeling better.  Sincerely, Keerat Denicola, DO     Signed: Casidee Jann, DO Internal Medicine Resident, PGY-1 08/03/2024, 1:42 PM   Please contact the on call pager after 5 pm and on weekends at [phone].

## 2024-08-03 NOTE — Plan of Care (Signed)
  Problem: Activity: Goal: Risk for activity intolerance will decrease Outcome: Progressing   Problem: Education: Goal: Knowledge of General Education information will improve Description: Including pain rating scale, medication(s)/side effects and non-pharmacologic comfort measures Outcome: Not Progressing   Problem: Health Behavior/Discharge Planning: Goal: Ability to manage health-related needs will improve Outcome: Not Progressing   Problem: Nutrition: Goal: Adequate nutrition will be maintained Outcome: Not Progressing   Problem: Coping: Goal: Level of anxiety will decrease Outcome: Not Progressing

## 2024-08-03 NOTE — Progress Notes (Signed)
 CSW met with patient per MD request, to provide information on AA meetings that patient reportedly asked for. Patient was rude to CSW, said he did not want to a Child psychotherapist, that he wanted the real doctor to do something about his severe pain, specifically requested dilaudid . CSW left resources on bedside table, and sent a message to MD with patient's request. Other counseling resources placed on patient's AVS.  CSW uploaded Change of Commitment form to eFile system, patient no longer under IVC. CSW notified MD and RN.  Almarie Goodie, [place] Clinical Social Worker [phone]

## 2024-08-03 NOTE — Progress Notes (Signed)
 Discharge Nurse Summary: DC order noted per MD. DC RN at bedside with patient. Patient agreeable with discharge plan, agreeable to transport to dc lounge for tax voucher for transport to brother Jeff's home in [place]. Patient transported to wheelchair needing touch assistance and supervision with transfer, flat affect and withdrawn presentation with discussion of care.  AVS printed/reviewed. Patient needing further reinforcement for purpose and benefit of treatment plan. Further education provided to the patient specific to medication treatment and need for compliance with close monitoring with providers outpatient, remains withdrawn from care.   PIV not present on assessment. Telemonitor not present on assessment. No DME needs. Home meds picked up from the main pharmacy, delivered to patient. Initially, unsure what meds were given to him until he was able to recall and accept home med delivery. TOC meds pending pickup. CP/Edu resolved. Patient dressed and ready to go. All belongings accounted for except clothing. Transport wheeled patient downstairs, picking up belongings in ED and TOC meds on the way to [facility].   On arrival to [facility] with TOC/home meds and belongings, lounge team states patient was resistant to taking taxi voucher requesting bus pass. This nurse informed dc lounge team, transfer ability is limited without assistance or supervision, informed patient of concern for safety without safe transportation to stated destination. Patient informed lounge team, he wants to go somewhere else before going to his brother's home, [facility] CM/SW and provider team informed of patient's request with concerns for safety. Upon provider team decision approving patient request after discussing risks and patient's right to make decisions, patient eloped from the dc lounge, unknown location of the patient after discharge.  Interdisciplinary team and providers informed, no further need for intervention given  patient's discharge status.

## 2024-08-03 NOTE — Progress Notes (Signed)
   08/03/24 1036  TOC Brief Assessment  Insurance and Status Reviewed  Patient has primary care physician No  Home environment has been reviewed homeless going to stay with brother in [place] at discharge  Prior level of function: independent  Prior/Current Home Services No current home services  Social Drivers of Health Review  (placed on AVS)  Transition of care needs  (placed on AVS)   Patient plans to stay with his brother in [place] at discharge . Patient does not have a PCP , would like one in or near [place]. Sent message to CMA. Appointment information will be placed on AVS

## 2024-08-03 NOTE — Discharge Instructions (Addendum)
 Gary [name],  You were recently admitted to [facility] for alcohol  intoxication and your withdrawals. You were placed on a medication taper to help reduce your cravings and control your withdrawal symptoms from becoming harmful to your body.    Continue taking your home medications with the following changes:  Start taking: Naltrexone  50mg , once daily. This is the medication that will quiet the alcohol  cravings you have. It may make you nauseous, but it is not guaranteed. To prevent this, take the medication in the morning with food.  Bactrim  double strength 800-160 mg tablet.  Take 1 pill every Monday Wednesday and Friday.  This is an antibiotic that will help prevent infections while your immune system is low.  Please continue to take this medicine, and please take this medicine with food.  It is incredibly important that you follow-up with Dr. Constance in the infectious disease clinic to make sure that your immune system improves. Zofran  4 mg tablet.  This is a medication for nausea.  You can take it every 8 hours as needed for nausea or vomiting. Luminal 32.4mg  tablet.  Take 2 tablets every 8 hours both today (9/25) and tomorrow (9/26).  Then on 9/27 and 9/28, take 1 tablet every 8 hours until you finish the bottle.  This was the medicine taper you were on to help with the alcohol  detox.  Be careful not to stop this medicine abruptly, as it can increase anxiety, cause tremors, or worse-induce seizures or cause death. Truvada  200-300 mg tablet, once daily. This is a medicine for HIV while you take the Luminal taper. Tivicay  50 mg tablet, 2 times daily. This is a medicine for HIV while you take the Luminal taper. Pause taking: Biktarvy  50-200-25mg  tablets. While you are on the Luminal tablets, your Biktarvy  will not be as effective to help treat your HIV. We will pause this medicine for now, until you finish that Luminal taper. We cannot emphasize enough the importance of meeting with your  infectious disease doctor to know when to restart the Biktarvy . Continue taking: Creon , three times daily for your pancreatitis Folic acid  1 mg tablet daily Gabapentin  100 mg capsule at bedtime Keppra  500 mg tablet, twice daily Protonix  40 mg tablet daily Vitamin B1 100 mg tablet daily   You should seek further medical care if you or a friend/family member notice any confusion or mental changes, you become increasingly shaky, or you begin to start to have fevers  >101 degrees.  We recommend that you see your primary care doctor in about a week to make sure that you continue to improve. Please call and establish with a new primary care doctor as soon as you are able. We also recommend you follow up with infectious disease about your Biktarvy  and making sure your immune system improves. We have set up an appointment for you to be seen by Dr. Overton on 08/16/2024 at 9:15AM.  We are so glad that you are feeling better.  Sincerely, Aldyn Toon, DO

## 2024-08-03 NOTE — Consult Note (Signed)
 [facility] Psychiatry Face-to-Face Psychiatric Evaluation   Service Date: August 03, 2024 LOS:  LOS: 2 days    Assessment   Gary [name] is a 38 y.o. male admitted medically on 07/31/2024 12:02 PM for SI during acute intoxication and admitted for monitored alcohol  withdrawal.  Psychiatric history is significant for alcohol  use disorder, GAD, MDD and has a past medical history of chronic pancreatitis and seizure disorder. Psychiatry was consulted for confirmation of IVC status by IMTS.  Patient does not meet IVC criteria and seems to have perceived abdominal pain from what he perceives as pancreatitis that causes him irritation and frustration. No psychiatric concerns at this time. He denies SI, HI, AVH.   We recommend rescinding IVC as patient does not meet IVC criteria at this time.   Diagnoses:  Active Hospital problems: Principal Problem:   Alcohol  withdrawal (HCC) Active Problems:   HIV disease (HCC)   Alcohol  use disorder, severe, dependence (HCC)   Severe episode of recurrent major depressive disorder, without psychotic features (HCC)   Alcohol  intoxication with delirium   Severe neutropenia   Pancytopenia (HCC)   Seizure disorder (HCC)   Chronic pancreatitis (HCC)   Generalized anxiety disorder   Hyponatremia     Plan  ## Safety and Observation Level:  - Based on my clinical evaluation, I estimate the patient to be at no risk of self harm in the current setting  - Will rescind IVC as patient denies SI, HI, or AVH and has agitation 2/2 to perceived abdominal pain which he attributes to pancreatitis. Lipase is WNL.   ## Medical Decision Making Capacity:  Not assessed on this encounter  ## Further Work-up:  Per primary  ## Disposition:  TBD  ## Behavioral / Environmental:  --Routine obs  Thank you for this consult request. Recommendations have been communicated to the primary team.  We will sign off at this time.    NEW history  Relevant Aspects  of Hospital Course:  Admitted on 07/31/2024 for alcohol  detox.  Patient Report:  On interview today, patient says his mood has been nice and that the reason he is here is because of his pancreatitis. He says that he is still having severe abdominal pain from his alcoholic pancreatitis. On further questioning about his mood, he says he is not depressed and does not have SI or HI. He endorses anxiety, but associates it with jitteriness he experiences 2/2 to withdrawal from alcohol . When asked about if he got into a fight prior to coming to the hospital or was endorsing HI at that time, he denies this and says what brought him to the hospital was pancreatitis, pancreatitis, and pancreatitis and that he has not yet received his dilaudid  for this. Patient was told that a message would be sent to the primary team to relay this information.   When asked if he has ever had any psychiatric diagnoses in the past or ever taken medications for mental health, he says never. He says he was started on a medication in the hospital (risperidone  pre chart review of allergies/contraindications), he says this had an opposite effect and made him want to kill everything and everyone. He denies feeling like this right now. When asked about his specific past psychiatric diagnoses listed in his chart such as PTSD, he says none of that is real and that he made all of it up. He is circumferential in bringing the topic of conversation back to his abdominal pain and need for pain  relief.   ROS:  As above  Collateral information:  none  Psychiatric History:  Information collected from patient, EMR  Family psych history: unknown   Social History:  As above   Family History:  The patient's family history includes Alcohol  abuse in his father and mother; Alcoholism in his mother; Cancer in an other family member; Colon cancer in an other family member; Hypertension in his mother; Other in an other family  member.  Medical History: Past Medical History:  Diagnosis Date   Alcohol  abuse    Alcohol -induced pancreatitis 04/16/2022   Anxiety    Bipolar 2 disorder (HCC)    HIV (human immunodeficiency virus infection) (HCC)    Pancreatitis    PTSD (post-traumatic stress disorder)    Schizophrenia (HCC)    Seizures (HCC)    Subdural hematoma (HCC)     Surgical History: Past Surgical History:  Procedure Laterality Date   BIOPSY  04/19/2022   Procedure: BIOPSY;  Surgeon: Wilhelmenia Aloha Raddle., MD;  Location: [facility] ENDOSCOPY;  Service: Gastroenterology;;   ENTEROSCOPY N/A 04/19/2022   Procedure: ENTEROSCOPY;  Surgeon: Wilhelmenia Aloha Raddle., MD;  Location: [facility] ENDOSCOPY;  Service: Gastroenterology;  Laterality: N/A;   INCISION AND DRAINAGE PERIRECTAL ABSCESS N/A 09/24/2016   Procedure: IRRIGATION AND DEBRIDEMENT PERIRECTAL ABSCESS;  Surgeon: Carlin Pastel, MD;  Location: [facility] ORS;  Service: General;  Laterality: N/A;   none      Medications:   Current Facility-Administered Medications:    acetaminophen  (TYLENOL ) tablet 650 mg, 650 mg, Oral, Q6H PRN, Bender, Emily, DO, 650 mg at 08/02/24 2006   diclofenac  Sodium (VOLTAREN ) 1 % topical gel 4 g, 4 g, Topical, QID, Bender, Emily, DO, 4 g at 08/02/24 1717   dolutegravir  (TIVICAY ) tablet 50 mg, 50 mg, Oral, BID, Eben Reyes BROCKS, MD, 50 mg at 08/03/24 9056   emtricitabine -tenofovir  (TRUVADA ) 200-300 MG per tablet 1 tablet, 1 tablet, Oral, Daily, Eben Reyes BROCKS, MD, 1 tablet at 08/03/24 9056   enoxaparin  (LOVENOX ) injection 40 mg, 40 mg, Subcutaneous, Q24H, McLendon, Michael, MD   folic acid  (FOLVITE ) tablet 1 mg, 1 mg, Oral, Daily, McLendon, Michael, MD, 1 mg at 08/03/24 9056   gabapentin  (NEURONTIN ) capsule 100 mg, 100 mg, Oral, TID, McLendon, Michael, MD, 100 mg at 08/03/24 9056   levETIRAcetam  (KEPPRA ) tablet 500 mg, 500 mg, Oral, BID, McLendon, Michael, MD, 500 mg at 08/03/24 0943   lidocaine  (LIDODERM ) 5 % 1 patch, 1 patch,  Transdermal, Q24H, Bender, Emily, DO, 1 patch at 08/02/24 2006   lipase/protease/amylase (CREON ) capsule 36,000 Units, 36,000 Units, Oral, TID AC, McLendon, Michael, MD, 36,000 Units at 08/03/24 9056   LORazepam  (ATIVAN ) tablet 1-4 mg, 1-4 mg, Oral, Q1H PRN, 1 mg at 08/02/24 2251 **OR** LORazepam  (ATIVAN ) injection 1-4 mg, 1-4 mg, Intravenous, Q1H PRN, Marylu Gee, DO, 2 mg at 08/03/24 0044   multivitamin with minerals tablet 1 tablet, 1 tablet, Oral, Daily, Paytes, Austin A, RPH, 1 tablet at 08/02/24 0827   ondansetron  (ZOFRAN ) tablet 4 mg, 4 mg, Oral, Q8H PRN, Nguyen, Diana, MD, 4 mg at 08/03/24 9056   oxyCODONE  (Oxy IR/ROXICODONE ) immediate release tablet 5 mg, 5 mg, Oral, Once, Marylu Gee, DO   pantoprazole  (PROTONIX ) EC tablet 40 mg, 40 mg, Oral, Daily, McLendon, Michael, MD, 40 mg at 08/03/24 0943   PHENobarbital  (LUMINAL) tablet 97.2 mg, 97.2 mg, Oral, Q8H, 97.2 mg at 08/02/24 1717 **FOLLOWED BY** PHENobarbital  (LUMINAL) tablet 64.8 mg, 64.8 mg, Oral, Q8H, 64.8 mg at 08/03/24 0607 **FOLLOWED BY** [START ON 08/05/2024]  PHENobarbital  (LUMINAL) tablet 32.4 mg, 32.4 mg, Oral, Q8H, McLendon, Michael, MD   polyethylene glycol (MIRALAX  / GLYCOLAX ) packet 17 g, 17 g, Oral, Daily, Bender, Emily, DO   sulfamethoxazole -trimethoprim  (BACTRIM  DS) 800-160 MG per tablet 1 tablet, 1 tablet, Oral, Q M,W,F, Amilibia, Jaden, DO, 1 tablet at 08/02/24 1554   thiamine  (VITAMIN B1) tablet 500 mg, 500 mg, Oral, TID, Amilibia, Jaden, DO, 500 mg at 08/03/24 0943  Allergies: Allergies  Allergen Reactions   Risperidone  And Paliperidone Other (See Comments)    Patient states causes opposite effect (he will not take again), and gave him psychotic thoughts   Tegretol  [Carbamazepine ] Other (See Comments)    Vertigo  Paralysis   Caffeine Palpitations       Objective  Vital signs:  Temp:  [97.9 F (36.6 C)-98.7 F (37.1 C)] 97.9 F (36.6 C) (09/25 0735) Pulse Rate:  [73-89] 73 (09/25 0735) Resp:  [16-20] 16  (09/25 0602) BP: (96-110)/(71-78) 96/78 (09/25 0735) SpO2:  [99 %-100 %] 100 % (09/25 0735)  Psychiatric Specialty Exam: Physical Exam Constitutional:      Appearance: the patient is cachectic appearing Pulmonary:     Effort: Pulmonary effort is normal.  Neurological:     General: No focal deficit present.     Mental Status: the patient is alert and oriented to person, place, and time.   Review of Systems  Respiratory:  Negative for shortness of breath.   Cardiovascular:  Negative for chest pain.  Gastrointestinal:  Negative for abdominal pain, constipation, diarrhea, nausea and vomiting.  Neurological:  Negative for headaches.      BP 96/78 (BP Location: Left Arm)   Pulse 73   Temp 97.9 F (36.6 C) (Oral)   Resp 16   SpO2 100%   General Appearance: Disheveled  Eye Contact:  Good  Speech:  Clear and Coherent  Volume:  Normal  Mood:  irritable  Affect:  Congruent  Thought Process:  goal directed  Orientation:  Full (Time, Place, and Person)  Thought Content: Logical   Suicidal Thoughts:  No  Homicidal Thoughts:  No  Memory:  Immediate;   fair  Judgement:  fair  Insight:  poor  Psychomotor Activity:  Normal  Concentration:  Concentration: Good  Recall:  Good  Fund of Knowledge: Good  Language: Good  Akathisia:  No  Handed:  unknown  AIMS (if indicated): not done  Assets:  Communication Skills  ADL's:  Intact  Cognition: WNL  Sleep:  poor   Brad Prey, MS3

## 2024-08-03 NOTE — Telephone Encounter (Signed)
 Patient Product/process development scientist completed.    The patient is insured through [employer] [place].     Ran test claim for acamprosate 333 mg and the current 30 day co-pay is $4.00.   This test claim was processed through [facility]- copay amounts may vary at other pharmacies due to pharmacy/plan contracts, or as the patient moves through the different stages of their insurance plan.     Reyes Sharps, CPHT Pharmacy Technician III Certified Patient Advocate [facility] Patient Advocate Team Direct Number: [phone]  Fax: [phone]

## 2024-08-08 ENCOUNTER — Other Ambulatory Visit (HOSPITAL_COMMUNITY): Payer: Self-pay

## 2024-08-08 ENCOUNTER — Other Ambulatory Visit: Payer: Self-pay

## 2024-08-08 ENCOUNTER — Encounter (HOSPITAL_COMMUNITY): Payer: Self-pay

## 2024-08-08 ENCOUNTER — Emergency Department (HOSPITAL_COMMUNITY)
Admission: EM | Admit: 2024-08-08 | Discharge: 2024-08-08 | Disposition: A | Discharge status: Home / Self Care | Payer: MEDICAID | Attending: Emergency Medicine | Admitting: Emergency Medicine

## 2024-08-08 DIAGNOSIS — Y908 Blood alcohol level of 240 mg/100 ml or more: Secondary | ICD-10-CM | POA: Diagnosis not present

## 2024-08-08 DIAGNOSIS — F10920 Alcohol use, unspecified with intoxication, uncomplicated: Secondary | ICD-10-CM

## 2024-08-08 DIAGNOSIS — F10939 Alcohol use, unspecified with withdrawal, unspecified: Secondary | ICD-10-CM | POA: Insufficient documentation

## 2024-08-08 DIAGNOSIS — R109 Unspecified abdominal pain: Secondary | ICD-10-CM | POA: Diagnosis present

## 2024-08-08 LAB — CBC
HCT: 35.4 % — ABNORMAL LOW (ref 39.0–52.0)
Hemoglobin: 11.7 g/dL — ABNORMAL LOW (ref 13.0–17.0)
MCH: 29.7 pg (ref 26.0–34.0)
MCHC: 33.1 g/dL (ref 30.0–36.0)
MCV: 89.8 fL (ref 80.0–100.0)
Platelets: 192 K/uL (ref 150–400)
RBC: 3.94 MIL/uL — ABNORMAL LOW (ref 4.22–5.81)
RDW: 21.2 % — ABNORMAL HIGH (ref 11.5–15.5)
WBC: 3.7 K/uL — ABNORMAL LOW (ref 4.0–10.5)
nRBC: 0 % (ref 0.0–0.2)

## 2024-08-08 LAB — COMPREHENSIVE METABOLIC PANEL WITH GFR
ALT: 27 U/L (ref 0–44)
AST: 39 U/L (ref 15–41)
Albumin: 3.7 g/dL (ref 3.5–5.0)
Alkaline Phosphatase: 56 U/L (ref 38–126)
Anion gap: 14 (ref 5–15)
BUN: 7 mg/dL (ref 6–20)
CO2: 25 mmol/L (ref 22–32)
Calcium: 8.5 mg/dL — ABNORMAL LOW (ref 8.9–10.3)
Chloride: 98 mmol/L (ref 98–111)
Creatinine, Ser: 0.65 mg/dL (ref 0.61–1.24)
GFR, Estimated: >60 mL/min (ref >60)
Glucose, Bld: 93 mg/dL (ref 70–99)
Potassium: 3.6 mmol/L (ref 3.5–5.1)
Sodium: 137 mmol/L (ref 135–145)
Total Bilirubin: 0.2 mg/dL (ref 0.0–1.2)
Total Protein: 6.8 g/dL (ref 6.5–8.1)

## 2024-08-08 LAB — LIPASE, BLOOD: Lipase: 49 U/L (ref 11–51)

## 2024-08-08 LAB — URINALYSIS, ROUTINE W REFLEX MICROSCOPIC
Bilirubin Urine: NEGATIVE
Glucose, UA: NEGATIVE mg/dL
Hgb urine dipstick: NEGATIVE
Ketones, ur: NEGATIVE mg/dL
Leukocytes,Ua: NEGATIVE
Nitrite: NEGATIVE
Protein, ur: NEGATIVE mg/dL
Specific Gravity, Urine: 1.014 (ref 1.005–1.030)
pH: 5 (ref 5.0–8.0)

## 2024-08-08 LAB — TROPONIN I (HIGH SENSITIVITY): Troponin I (High Sensitivity): 8 ng/L (ref <18)

## 2024-08-08 LAB — ETHANOL: Alcohol, Ethyl (B): 303 mg/dL (ref <15)

## 2024-08-08 MED ORDER — SUCRALFATE 1 G PO TABS
1.0000 g | ORAL_TABLET | Freq: Once | ORAL | Status: AC
  Administered 2024-08-08: 1 g via ORAL
  Filled 2024-08-08: qty 1

## 2024-08-08 MED ORDER — PHENOBARBITAL 20 MG/5ML PO ELIX
60.0000 mg | ORAL_SOLUTION | Freq: Once | ORAL | Status: AC
  Administered 2024-08-08: 60 mg via ORAL
  Filled 2024-08-08: qty 15

## 2024-08-08 MED ORDER — PHENOBARBITAL 32.4 MG PO TABS
ORAL_TABLET | ORAL | 0 refills | Status: DC
  Filled 2024-08-08: qty 18, 4d supply, fill #0

## 2024-08-08 NOTE — ED Notes (Signed)
 Pt tolerated drinking soda well. Pt states he will not eat any food.

## 2024-08-08 NOTE — Discharge Instructions (Addendum)
 Return for any problem.   They is no indication of acute pancreatitis on you workup today.

## 2024-08-08 NOTE — ED Triage Notes (Signed)
 Pt BIB [place] [employer] (pt was being arrested) d/t ABD pain/CP - hx of Pancreatitis.  He also states he is withdrawing - unsure when he last drank.

## 2024-08-08 NOTE — ED Provider Notes (Signed)
 Patient without indication for admission.  Workup does not suggest acute pancreatitis.  Patient is appropriate for discharge into law enforcement custody.     Laurice Maude BROCKS, MD 08/08/24 [phone]

## 2024-08-08 NOTE — ED Notes (Signed)
Pt given sprite for PO challenge

## 2024-08-08 NOTE — ED Provider Notes (Signed)
 [place] EMERGENCY DEPARTMENT AT [facility] Provider Note   CSN: [id number] Arrival date & time: 08/08/24  0413     Patient presents with: Abdominal Pain   Gary [name] is a 38 y.o. male.   The history is provided by the patient and the police.  Abdominal Pain Gary [name] is a 38 y.o. male who presents to the Emergency Department complaining of medical clearance. He presents the emergency department in police custody after stealing wine from [facility]. He states that he has abdominal pain and alcohol  withdrawal. He states that he was able to drink one of the two bottles of wine that he stole before getting caught. He was discharged from the hospital on September 25 following admission for alcohol  withdrawal. He states that he did not take any the medications that he was discharged on and immediately started drinking again. He complains of chronic abdominal pain. He states that he lied about wanting to kill himself to get admitted to the hospital last time. He states that his goal today is to get out of handcuffs. No fevers, vomiting, diarrhea.     Prior to Admission medications   Medication Sig Start Date End Date Taking? Authorizing Provider  bictegravir-emtricitabine -tenofovir  AF (BIKTARVY ) 50-200-25 MG TABS tablet Take 1 tablet by mouth daily. 02/24/24   Von Bellis, MD  dolutegravir  (TIVICAY ) 50 MG tablet Take 1 tablet (50 mg total) by mouth 2 (two) times daily. 08/03/24   Marylu Gee, DO  emtricitabine -tenofovir  (TRUVADA ) 200-300 MG tablet Take 1 tablet by mouth daily. 08/04/24   Marylu Gee, DO  folic acid  (FOLVITE ) 1 MG tablet Take 1 tablet (1 mg total) by mouth daily. 05/27/24   Amin, Sumayya, MD  gabapentin  (NEURONTIN ) 100 MG capsule Take 1 capsule (100 mg total) by mouth at bedtime. Patient taking differently: Take 100 mg by mouth 3 (three) times daily. 06/28/24   Tobie Yetta HERO, MD  levETIRAcetam  (KEPPRA ) 500 MG tablet Take 1 tablet (500 mg total) by mouth  2 (two) times daily. 06/28/24   Tobie Yetta HERO, MD  lipase/protease/amylase (CREON ) 36000 UNITS CPEP capsule Take 1 capsule (36,000 Units total) by mouth 3 (three) times daily before meals. 05/26/24   Caleen Qualia, MD  naltrexone  (DEPADE) 50 MG tablet Take 1 tablet (50 mg total) by mouth daily. 08/03/24   Marylu Gee, DO  ondansetron  (ZOFRAN ) 4 MG tablet Take 1 tablet (4 mg total) by mouth every 8 (eight) hours as needed for nausea or vomiting. 08/03/24   Marylu Gee, DO  pantoprazole  (PROTONIX ) 40 MG tablet Take 1 tablet (40 mg total) by mouth daily. 06/29/24   Tobie Yetta HERO, MD  PHENobarbital  (LUMINAL) 32.4 MG tablet Take 2 tablets (64.8 mg total) by mouth every 8 (eight) hours for 2 days, THEN 1 tablet (32.4 mg total) every 8 (eight) hours for 2 days. 08/03/24 08/07/24  Eben Reyes BROCKS, MD  sulfamethoxazole -trimethoprim  (BACTRIM  DS) 800-160 MG tablet Take 1 tablet by mouth every Monday, Wednesday, and Friday. 08/04/24   Marylu Gee, DO  thiamine  (VITAMIN B1) 100 MG tablet Take 1 tablet (100 mg total) by mouth daily. 06/28/24   Tobie Yetta HERO, MD  famotidine  (PEPCID ) 20 MG tablet Take 1 tablet (20 mg total) by mouth 2 (two) times daily. Patient not taking: Reported on 06/01/2019 05/14/19 06/02/19  [name], MD    Allergies: Risperidone  and paliperidone, Tegretol  [carbamazepine ], and Caffeine    Review of Systems  Gastrointestinal:  Positive for abdominal pain.  All other systems  reviewed and are negative.   Updated Vital Signs BP 105/75   Pulse 87   Temp 97.9 F (36.6 C) (Oral)   Resp 17   Ht 6' (1.829 m)   Wt 65.8 kg   SpO2 97%   BMI 19.67 kg/m   Physical Exam Vitals and nursing note reviewed.  Constitutional:      Appearance: He is well-developed.  HENT:     Head: Normocephalic and atraumatic.  Cardiovascular:     Rate and Rhythm: Normal rate and regular rhythm.  Pulmonary:     Effort: Pulmonary effort is normal. No respiratory distress.  Abdominal:      Palpations: Abdomen is soft.     Tenderness: There is no guarding or rebound.     Comments: Mild generalized abdominal tenderness  Musculoskeletal:        General: No tenderness.  Skin:    General: Skin is warm and dry.  Neurological:     Mental Status: He is alert and oriented to person, place, and time.  Psychiatric:        Behavior: Behavior normal.     (all labs ordered are listed, but only abnormal results are displayed) Labs Reviewed  LIPASE, BLOOD  COMPREHENSIVE METABOLIC PANEL WITH GFR  CBC  URINALYSIS, ROUTINE W REFLEX MICROSCOPIC  TROPONIN I (HIGH SENSITIVITY)    EKG: None  Radiology: No results found.   Procedures   Medications Ordered in the ED - No data to display                                  Medical Decision Making Amount and/or Complexity of Data Reviewed Labs: ordered.  Risk Prescription drug management.   Patient with history of alcohol  use disorder, chronic pancreatitis and AIDS here for evaluation of abdominal pain in the setting of getting arrested for larceny. He has mild generalized tenderness on examination. He appears very comfortable on evaluation. No overt evidence of alcohol  withdrawal at this time. Patient is concerning for withdrawal, will provide dose of phenobarbital . CBC, CMP are unremarkable. Patient care transferred pending EtOH, lipase.     Final diagnoses:  None    ED Discharge Orders     None          Griselda Norris, MD 08/08/24 [phone]

## 2024-08-15 ENCOUNTER — Other Ambulatory Visit: Payer: Self-pay

## 2024-08-15 ENCOUNTER — Emergency Department (HOSPITAL_COMMUNITY): Payer: MEDICAID

## 2024-08-15 ENCOUNTER — Encounter (HOSPITAL_COMMUNITY): Payer: Self-pay | Admitting: Emergency Medicine

## 2024-08-15 ENCOUNTER — Ambulatory Visit: Payer: MEDICAID | Admitting: Family Medicine

## 2024-08-15 ENCOUNTER — Emergency Department (HOSPITAL_COMMUNITY)
Admission: EM | Admit: 2024-08-15 | Discharge: 2024-08-16 | Disposition: A | Discharge status: Home / Self Care | Payer: MEDICAID | Attending: Emergency Medicine | Admitting: Emergency Medicine

## 2024-08-15 DIAGNOSIS — S7002XA Contusion of left hip, initial encounter: Secondary | ICD-10-CM | POA: Diagnosis not present

## 2024-08-15 DIAGNOSIS — S0081XA Abrasion of other part of head, initial encounter: Secondary | ICD-10-CM

## 2024-08-15 DIAGNOSIS — S79912A Unspecified injury of left hip, initial encounter: Secondary | ICD-10-CM | POA: Diagnosis present

## 2024-08-15 DIAGNOSIS — F10129 Alcohol abuse with intoxication, unspecified: Secondary | ICD-10-CM | POA: Insufficient documentation

## 2024-08-15 DIAGNOSIS — S00211A Abrasion of right eyelid and periocular area, initial encounter: Secondary | ICD-10-CM | POA: Diagnosis not present

## 2024-08-15 DIAGNOSIS — W19XXXA Unspecified fall, initial encounter: Secondary | ICD-10-CM

## 2024-08-15 DIAGNOSIS — Y908 Blood alcohol level of 240 mg/100 ml or more: Secondary | ICD-10-CM | POA: Insufficient documentation

## 2024-08-15 DIAGNOSIS — W1830XA Fall on same level, unspecified, initial encounter: Secondary | ICD-10-CM | POA: Diagnosis not present

## 2024-08-15 DIAGNOSIS — F10920 Alcohol use, unspecified with intoxication, uncomplicated: Secondary | ICD-10-CM

## 2024-08-15 LAB — COMPREHENSIVE METABOLIC PANEL WITH GFR
ALT: 23 U/L (ref 0–44)
AST: 37 U/L (ref 15–41)
Albumin: 4.4 g/dL (ref 3.5–5.0)
Alkaline Phosphatase: 58 U/L (ref 38–126)
Anion gap: 16 — ABNORMAL HIGH (ref 5–15)
BUN: 13 mg/dL (ref 6–20)
CO2: 24 mmol/L (ref 22–32)
Calcium: 9.3 mg/dL (ref 8.9–10.3)
Chloride: 105 mmol/L (ref 98–111)
Creatinine, Ser: 0.61 mg/dL (ref 0.61–1.24)
GFR, Estimated: >60 mL/min (ref >60)
Glucose, Bld: 100 mg/dL — ABNORMAL HIGH (ref 70–99)
Potassium: 3.5 mmol/L (ref 3.5–5.1)
Sodium: 145 mmol/L (ref 135–145)
Total Bilirubin: 0.2 mg/dL (ref 0.0–1.2)
Total Protein: 7.1 g/dL (ref 6.5–8.1)

## 2024-08-15 LAB — CBC
HCT: 36 % — ABNORMAL LOW (ref 39.0–52.0)
Hemoglobin: 11.4 g/dL — ABNORMAL LOW (ref 13.0–17.0)
MCH: 29.6 pg (ref 26.0–34.0)
MCHC: 31.7 g/dL (ref 30.0–36.0)
MCV: 93.5 fL (ref 80.0–100.0)
Platelets: 265 K/uL (ref 150–400)
RBC: 3.85 MIL/uL — ABNORMAL LOW (ref 4.22–5.81)
RDW: 20.6 % — ABNORMAL HIGH (ref 11.5–15.5)
WBC: 4.6 K/uL (ref 4.0–10.5)
nRBC: 0 % (ref 0.0–0.2)

## 2024-08-15 LAB — ETHANOL: Alcohol, Ethyl (B): >450 mg/dL (ref <15)

## 2024-08-15 LAB — CBG MONITORING, ED
Glucose-Capillary: 108 mg/dL — ABNORMAL HIGH (ref 70–99)
Glucose-Capillary: 111 mg/dL — ABNORMAL HIGH (ref 70–99)

## 2024-08-15 MED ORDER — THIAMINE HCL 100 MG/ML IJ SOLN
100.0000 mg | Freq: Once | INTRAMUSCULAR | Status: AC
Start: 2024-08-15 — End: 2024-08-15
  Administered 2024-08-15: 100 mg via INTRAVENOUS
  Filled 2024-08-15: qty 2

## 2024-08-15 NOTE — ED Triage Notes (Addendum)
 Patient BIB EMS c/o Fall. Per report patient fell and struck his head, patient sustained laceration above the right eyebrow; bleeding has been controlled. Alcohol  is on board. Patient unable to tell if he LOC. Patient denies taking blood thinner. C-Collar applied in triage  BP 104/68 HR 86 RR 20 O2sat 98% on RA CBG 110

## 2024-08-16 ENCOUNTER — Encounter: Payer: MEDICAID | Admitting: Internal Medicine

## 2024-08-16 NOTE — ED Provider Notes (Signed)
 [place] EMERGENCY DEPARTMENT AT [facility] Provider Note   CSN: [id number] Arrival date & time: 08/15/24  2222     Patient presents with: Gary [name] is a 38 y.o. male.  {Add pertinent medical, surgical, social history, OB history to [id number]:67052} The history is provided by the patient, the EMS personnel and medical records.  Fall  Gary [name] is a 38 y.o. male who presents to the Emergency Department complaining of fall. He presents the emergency department by EMS for evaluation of injuries following a fall with head injury. Per report he has alcohol  on board. Patient is unable to provide any history at time of ED arrival.     Prior to Admission medications   Medication Sig Start Date End Date Taking? Authorizing Provider  bictegravir-emtricitabine -tenofovir  AF (BIKTARVY ) 50-200-25 MG TABS tablet Take 1 tablet by mouth daily. 02/24/24   Von Bellis, MD  dolutegravir  (TIVICAY ) 50 MG tablet Take 1 tablet (50 mg total) by mouth 2 (two) times daily. 08/03/24   Marylu Gee, DO  emtricitabine -tenofovir  (TRUVADA ) 200-300 MG tablet Take 1 tablet by mouth daily. 08/04/24   Marylu Gee, DO  folic acid  (FOLVITE ) 1 MG tablet Take 1 tablet (1 mg total) by mouth daily. 05/27/24   Amin, Sumayya, MD  gabapentin  (NEURONTIN ) 100 MG capsule Take 1 capsule (100 mg total) by mouth at bedtime. Patient taking differently: Take 100 mg by mouth 3 (three) times daily. 06/28/24   Tobie Yetta HERO, MD  levETIRAcetam  (KEPPRA ) 500 MG tablet Take 1 tablet (500 mg total) by mouth 2 (two) times daily. 06/28/24   Tobie Yetta HERO, MD  lipase/protease/amylase (CREON ) 36000 UNITS CPEP capsule Take 1 capsule (36,000 Units total) by mouth 3 (three) times daily before meals. 05/26/24   Caleen Qualia, MD  naltrexone  (DEPADE) 50 MG tablet Take 1 tablet (50 mg total) by mouth daily. 08/03/24   Marylu Gee, DO  ondansetron  (ZOFRAN ) 4 MG tablet Take 1 tablet (4 mg total) by mouth every 8 (eight) hours  as needed for nausea or vomiting. 08/03/24   Marylu Gee, DO  pantoprazole  (PROTONIX ) 40 MG tablet Take 1 tablet (40 mg total) by mouth daily. 06/29/24   Tobie Yetta HERO, MD  PHENobarbital  (LUMINAL) 32.4 MG tablet Take 2 tablets (64.8 mg total) by mouth every 8 (eight) hours for 2 days, THEN 1 tablet (32.4 mg total) every 8 (eight) hours for 2 days. 08/08/24 08/12/24  Laurice Maude BROCKS, MD  sulfamethoxazole -trimethoprim  (BACTRIM  DS) 800-160 MG tablet Take 1 tablet by mouth every Monday, Wednesday, and Friday. 08/04/24   Marylu Gee, DO  thiamine  (VITAMIN B1) 100 MG tablet Take 1 tablet (100 mg total) by mouth daily. 06/28/24   Tobie Yetta HERO, MD  famotidine  (PEPCID ) 20 MG tablet Take 1 tablet (20 mg total) by mouth 2 (two) times daily. Patient not taking: Reported on 06/01/2019 05/14/19 06/02/19  [name], MD    Allergies: Risperidone  and paliperidone, Tegretol  [carbamazepine ], and Caffeine    Review of Systems  Unable to perform ROS: Mental status change    Updated Vital Signs BP (!) 100/56 (BP Location: Left Arm)   Pulse 100   Temp 97.6 F (36.4 C) (Oral)   Resp 18   SpO2 100%   Physical Exam Vitals and nursing note reviewed.  Constitutional:      Appearance: He is well-developed.     Comments: Somnolent difficult to awaken.   HENT:     Head: Normocephalic.  Comments: Abrasion over right eyebrow. Pupils equal round and reactive, EOMI. Cardiovascular:     Rate and Rhythm: Normal rate and regular rhythm.     Heart sounds: No murmur heard. Pulmonary:     Effort: Pulmonary effort is normal. No respiratory distress.     Breath sounds: Normal breath sounds.  Abdominal:     Palpations: Abdomen is soft.     Tenderness: There is no abdominal tenderness. There is no guarding or rebound.  Musculoskeletal:        General: No tenderness.     Comments: Ecchymosis over the left hip without any focal tenderness  Skin:    General: Skin is warm and dry.  Neurological:     Mental  Status: He is oriented to person, place, and time.  Psychiatric:        Behavior: Behavior normal.     (all labs ordered are listed, but only abnormal results are displayed) Labs Reviewed  COMPREHENSIVE METABOLIC PANEL WITH GFR - Abnormal; Notable for the following components:      Result Value   Glucose, Bld 100 (*)    Anion gap 16 (*)    All other components within normal limits  CBC - Abnormal; Notable for the following components:   RBC 3.85 (*)    Hemoglobin 11.4 (*)    HCT 36.0 (*)    RDW 20.6 (*)    All other components within normal limits  ETHANOL - Abnormal; Notable for the following components:   Alcohol , Ethyl (B) >450 (*)    All other components within normal limits  CBG MONITORING, ED - Abnormal; Notable for the following components:   Glucose-Capillary 111 (*)    All other components within normal limits  CBG MONITORING, ED - Abnormal; Notable for the following components:   Glucose-Capillary 108 (*)    All other components within normal limits  URINALYSIS, ROUTINE W REFLEX MICROSCOPIC    EKG: None  Radiology: [facility] Chest Port 1 View Result Date: 08/15/2024 CLINICAL DATA:  Fall.  Alcohol  use. EXAM: PORTABLE CHEST 1 VIEW COMPARISON:  05/02/2024 FINDINGS: Patient rotation limits the examination. Shallow inspiration with elevation of the left hemidiaphragm. Heart size is obscured. There is likely linear atelectasis in the left base. No suggestion of focal consolidation. No pleural effusion or pneumothorax. IMPRESSION: Shallow inspiration with probable linear atelectasis in the left base. Electronically Signed   By: Elsie Gravely M.D.   On: 08/15/2024 23:59   CT HEAD WO CONTRAST Result Date: 08/15/2024 CLINICAL DATA:  Headache, post traumatic; Neck trauma, intoxicated or obtunded (Age >= 16y) EXAM: CT HEAD WITHOUT CONTRAST CT CERVICAL SPINE WITHOUT CONTRAST TECHNIQUE: Multidetector CT imaging of the head and cervical spine was performed following the standard  protocol without intravenous contrast. Multiplanar CT image reconstructions of the cervical spine were also generated. RADIATION DOSE REDUCTION: This exam was performed according to the departmental dose-optimization program which includes automated exposure control, adjustment of the mA and/or kV according to patient size and/or use of iterative reconstruction technique. COMPARISON:  CT head and C-spine 04/30/2024. FINDINGS: CT HEAD FINDINGS Brain: No evidence of large-territorial acute infarction. No parenchymal hemorrhage. No mass lesion. No extra-axial collection. No mass effect or midline shift. No hydrocephalus. Basilar cisterns are patent. Vascular: No hyperdense vessel. Skull: No acute fracture or focal lesion. Sinuses/Orbits: Paranasal sinuses and mastoid air cells are clear. The orbits are unremarkable. Other: None. CT CERVICAL SPINE FINDINGS Alignment: Mild retrolisthesis of C4 on C5. Skull base and vertebrae: No acute  fracture. No aggressive appearing focal osseous lesion or focal pathologic process. Soft tissues and spinal canal: No prevertebral fluid or swelling. No visible canal hematoma. Upper chest: Unremarkable. Other: None. IMPRESSION: 1. No acute intracranial abnormality. 2. No acute displaced fracture or traumatic listhesis of the cervical spine. Electronically Signed   By: Morgane  Naveau M.D.   On: 08/15/2024 23:33   CT Cervical Spine Wo Contrast Result Date: 08/15/2024 CLINICAL DATA:  Headache, post traumatic; Neck trauma, intoxicated or obtunded (Age >= 16y) EXAM: CT HEAD WITHOUT CONTRAST CT CERVICAL SPINE WITHOUT CONTRAST TECHNIQUE: Multidetector CT imaging of the head and cervical spine was performed following the standard protocol without intravenous contrast. Multiplanar CT image reconstructions of the cervical spine were also generated. RADIATION DOSE REDUCTION: This exam was performed according to the departmental dose-optimization program which includes automated exposure control,  adjustment of the mA and/or kV according to patient size and/or use of iterative reconstruction technique. COMPARISON:  CT head and C-spine 04/30/2024. FINDINGS: CT HEAD FINDINGS Brain: No evidence of large-territorial acute infarction. No parenchymal hemorrhage. No mass lesion. No extra-axial collection. No mass effect or midline shift. No hydrocephalus. Basilar cisterns are patent. Vascular: No hyperdense vessel. Skull: No acute fracture or focal lesion. Sinuses/Orbits: Paranasal sinuses and mastoid air cells are clear. The orbits are unremarkable. Other: None. CT CERVICAL SPINE FINDINGS Alignment: Mild retrolisthesis of C4 on C5. Skull base and vertebrae: No acute fracture. No aggressive appearing focal osseous lesion or focal pathologic process. Soft tissues and spinal canal: No prevertebral fluid or swelling. No visible canal hematoma. Upper chest: Unremarkable. Other: None. IMPRESSION: 1. No acute intracranial abnormality. 2. No acute displaced fracture or traumatic listhesis of the cervical spine. Electronically Signed   By: Morgane  Naveau M.D.   On: 08/15/2024 23:33    {Document cardiac monitor, telemetry assessment procedure when appropriate:32947} Procedures   Medications Ordered in the ED  thiamine  (VITAMIN B1) injection 100 mg (100 mg Intravenous Given by Other 08/15/24 2357)      {Click here for ABCD2, HEART and other calculators REFRESH Note before signing:1}                              Medical Decision Making Amount and/or Complexity of Data Reviewed Labs: ordered. Radiology: ordered.  Risk Prescription drug management.   Patient with history of alcohol  use disorder here for evaluation following a fall. He does have an abrasion to his right eyebrow, this will not need repaired. CT head and C-spine are negative for acute injury. Alcohol  is significantly elevated and patient remains very somnolent, intoxicated during his ED stay. He does have ecchymosis on his left hip, does not  appear to be tender in this area. He had multiple repeat abdominal examinations without any recurrent tenderness. {Document critical care time when appropriate  Document review of labs and clinical decision tools ie CHADS2VASC2, etc  Document your independent review of radiology images and any outside records  Document your discussion with family members, caretakers and with consultants  Document social determinants of health affecting pt's care  Document your decision making why or why not admission, treatments were needed:32947:::1}   Final diagnoses:  Alcoholic intoxication without complication  Abrasion of face, initial encounter  Fall, initial encounter    ED Discharge Orders     None

## 2024-08-16 NOTE — ED Notes (Signed)
Pt given ginger ale to drink. 

## 2024-08-17 ENCOUNTER — Ambulatory Visit (INDEPENDENT_AMBULATORY_CARE_PROVIDER_SITE_OTHER): Payer: MEDICAID | Admitting: Primary Care

## 2024-08-23 ENCOUNTER — Ambulatory Visit: Payer: MEDICAID | Admitting: Internal Medicine

## 2024-08-29 ENCOUNTER — Other Ambulatory Visit: Payer: Self-pay

## 2024-08-29 ENCOUNTER — Emergency Department (HOSPITAL_COMMUNITY)
Admission: EM | Admit: 2024-08-29 | Discharge: 2024-08-29 | Disposition: A | Discharge status: Home / Self Care | Payer: MEDICAID

## 2024-08-29 ENCOUNTER — Emergency Department (HOSPITAL_COMMUNITY)
Admission: EM | Admit: 2024-08-29 | Discharge: 2024-08-30 | Disposition: A | Discharge status: Home / Self Care | Payer: MEDICAID | Source: Home / Self Care | Attending: Emergency Medicine | Admitting: Emergency Medicine

## 2024-08-29 ENCOUNTER — Emergency Department (HOSPITAL_COMMUNITY): Payer: MEDICAID

## 2024-08-29 ENCOUNTER — Encounter: Payer: Self-pay | Admitting: Internal Medicine

## 2024-08-29 DIAGNOSIS — Z5900 Homelessness unspecified: Secondary | ICD-10-CM | POA: Insufficient documentation

## 2024-08-29 DIAGNOSIS — F1010 Alcohol abuse, uncomplicated: Secondary | ICD-10-CM | POA: Diagnosis not present

## 2024-08-29 DIAGNOSIS — M25552 Pain in left hip: Secondary | ICD-10-CM | POA: Insufficient documentation

## 2024-08-29 DIAGNOSIS — M25559 Pain in unspecified hip: Secondary | ICD-10-CM | POA: Diagnosis present

## 2024-08-29 DIAGNOSIS — Z21 Asymptomatic human immunodeficiency virus [HIV] infection status: Secondary | ICD-10-CM | POA: Insufficient documentation

## 2024-08-29 DIAGNOSIS — W19XXXA Unspecified fall, initial encounter: Secondary | ICD-10-CM | POA: Diagnosis not present

## 2024-08-29 DIAGNOSIS — Y909 Presence of alcohol in blood, level not specified: Secondary | ICD-10-CM | POA: Insufficient documentation

## 2024-08-29 DIAGNOSIS — F10120 Alcohol abuse with intoxication, uncomplicated: Secondary | ICD-10-CM | POA: Insufficient documentation

## 2024-08-29 DIAGNOSIS — F10920 Alcohol use, unspecified with intoxication, uncomplicated: Secondary | ICD-10-CM

## 2024-08-29 LAB — COMPREHENSIVE METABOLIC PANEL WITH GFR
ALT: 30 U/L (ref 0–44)
AST: 52 U/L — ABNORMAL HIGH (ref 15–41)
Albumin: 4.2 g/dL (ref 3.5–5.0)
Alkaline Phosphatase: 72 U/L (ref 38–126)
Anion gap: 13 (ref 5–15)
BUN: 7 mg/dL (ref 6–20)
CO2: 25 mmol/L (ref 22–32)
Calcium: 8.4 mg/dL — ABNORMAL LOW (ref 8.9–10.3)
Chloride: 104 mmol/L (ref 98–111)
Creatinine, Ser: 0.48 mg/dL — ABNORMAL LOW (ref 0.61–1.24)
GFR, Estimated: >60 mL/min (ref >60)
Glucose, Bld: 101 mg/dL — ABNORMAL HIGH (ref 70–99)
Potassium: 3.9 mmol/L (ref 3.5–5.1)
Sodium: 142 mmol/L (ref 135–145)
Total Bilirubin: 0.3 mg/dL (ref 0.0–1.2)
Total Protein: 7 g/dL (ref 6.5–8.1)

## 2024-08-29 LAB — CBC WITH DIFFERENTIAL/PLATELET
Abs Immature Granulocytes: 0.01 K/uL (ref 0.00–0.07)
Basophils Absolute: 0.1 K/uL (ref 0.0–0.1)
Basophils Relative: 2 %
Eosinophils Absolute: 0.1 K/uL (ref 0.0–0.5)
Eosinophils Relative: 2 %
HCT: 37.3 % — ABNORMAL LOW (ref 39.0–52.0)
Hemoglobin: 11.8 g/dL — ABNORMAL LOW (ref 13.0–17.0)
Immature Granulocytes: 0 %
Lymphocytes Relative: 44 %
Lymphs Abs: 1.3 K/uL (ref 0.7–4.0)
MCH: 29.1 pg (ref 26.0–34.0)
MCHC: 31.6 g/dL (ref 30.0–36.0)
MCV: 92.1 fL (ref 80.0–100.0)
Monocytes Absolute: 0.2 K/uL (ref 0.1–1.0)
Monocytes Relative: 8 %
Neutro Abs: 1.3 K/uL — ABNORMAL LOW (ref 1.7–7.7)
Neutrophils Relative %: 44 %
Platelets: 95 K/uL — ABNORMAL LOW (ref 150–400)
RBC: 4.05 MIL/uL — ABNORMAL LOW (ref 4.22–5.81)
RDW: 18.8 % — ABNORMAL HIGH (ref 11.5–15.5)
Smear Review: NORMAL
WBC: 2.9 K/uL — ABNORMAL LOW (ref 4.0–10.5)
nRBC: 0 % (ref 0.0–0.2)

## 2024-08-29 LAB — MAGNESIUM: Magnesium: 2.1 mg/dL (ref 1.7–2.4)

## 2024-08-29 LAB — ETHANOL: Alcohol, Ethyl (B): 441 mg/dL (ref <15)

## 2024-08-29 MED ORDER — KETOROLAC TROMETHAMINE 60 MG/2ML IM SOLN
30.0000 mg | Freq: Once | INTRAMUSCULAR | Status: AC
  Administered 2024-08-29: 30 mg via INTRAMUSCULAR
  Filled 2024-08-29: qty 2

## 2024-08-29 NOTE — ED Notes (Signed)
 PT ambulate without any assisted

## 2024-08-29 NOTE — ED Notes (Signed)
 Attempted to wake pt and offered food- pt still sleeping. RR even and unlabored

## 2024-08-29 NOTE — ED Triage Notes (Signed)
 Patient to ED by EMS from [facility] with homicidal thoughts and left leg pain.

## 2024-08-29 NOTE — ED Triage Notes (Signed)
 Pt denies SI/HI. EDP at bedside.

## 2024-08-29 NOTE — ED Notes (Signed)
 Pt continues to deny SI/HI. Reports 10/10 right leg pain. Mobility intact. Pain meds given per MD order

## 2024-08-29 NOTE — Discharge Instructions (Signed)
 You can use Tylenol  and Motrin  as needed for hip pain.

## 2024-08-29 NOTE — ED Provider Notes (Signed)
 [place] EMERGENCY DEPARTMENT AT [facility] Provider Note   CSN: [id number] Arrival date & time: 08/29/24  1250     Patient presents with: Hip Pain and Alcohol  Intoxication   Gary [name] is a 38 y.o. male.   38 year old male brought in from [facility] by EMS for hip pain after a fall 2 days ago.  Patient is well-known to the emergency department.  Has a history of alcohol  abuse.  On exam he is sleeping, not very forthcoming with history.   Hip Pain  Alcohol  Intoxication       Prior to Admission medications   Medication Sig Start Date End Date Taking? Authorizing Provider  bictegravir-emtricitabine -tenofovir  AF (BIKTARVY ) 50-200-25 MG TABS tablet Take 1 tablet by mouth daily. 02/24/24   Von Bellis, MD  dolutegravir  (TIVICAY ) 50 MG tablet Take 1 tablet (50 mg total) by mouth 2 (two) times daily. 08/03/24   Marylu Gee, DO  emtricitabine -tenofovir  (TRUVADA ) 200-300 MG tablet Take 1 tablet by mouth daily. 08/04/24   Marylu Gee, DO  folic acid  (FOLVITE ) 1 MG tablet Take 1 tablet (1 mg total) by mouth daily. 05/27/24   Amin, Sumayya, MD  gabapentin  (NEURONTIN ) 100 MG capsule Take 1 capsule (100 mg total) by mouth at bedtime. Patient taking differently: Take 100 mg by mouth 3 (three) times daily. 06/28/24   Tobie Yetta HERO, MD  levETIRAcetam  (KEPPRA ) 500 MG tablet Take 1 tablet (500 mg total) by mouth 2 (two) times daily. 06/28/24   Tobie Yetta HERO, MD  lipase/protease/amylase (CREON ) 36000 UNITS CPEP capsule Take 1 capsule (36,000 Units total) by mouth 3 (three) times daily before meals. 05/26/24   Caleen Qualia, MD  naltrexone  (DEPADE) 50 MG tablet Take 1 tablet (50 mg total) by mouth daily. 08/03/24   Marylu Gee, DO  ondansetron  (ZOFRAN ) 4 MG tablet Take 1 tablet (4 mg total) by mouth every 8 (eight) hours as needed for nausea or vomiting. 08/03/24   Marylu Gee, DO  pantoprazole  (PROTONIX ) 40 MG tablet Take 1 tablet (40 mg total) by mouth daily. 06/29/24    Tobie Yetta HERO, MD  PHENobarbital  (LUMINAL) 32.4 MG tablet Take 2 tablets (64.8 mg total) by mouth every 8 (eight) hours for 2 days, THEN 1 tablet (32.4 mg total) every 8 (eight) hours for 2 days. 08/08/24 08/12/24  Laurice Maude BROCKS, MD  sulfamethoxazole -trimethoprim  (BACTRIM  DS) 800-160 MG tablet Take 1 tablet by mouth every Monday, Wednesday, and Friday. 08/04/24   Marylu Gee, DO  thiamine  (VITAMIN B1) 100 MG tablet Take 1 tablet (100 mg total) by mouth daily. 06/28/24   Tobie Yetta HERO, MD  famotidine  (PEPCID ) 20 MG tablet Take 1 tablet (20 mg total) by mouth 2 (two) times daily. Patient not taking: Reported on 06/01/2019 05/14/19 06/02/19  [name], MD    Allergies: Risperidone  and paliperidone, Tegretol  [carbamazepine ], and Caffeine    Review of Systems  Reason unable to perform ROS: sleeping.    Updated Vital Signs BP 104/71 (BP Location: Right Arm)   Pulse 89   Temp 98.5 F (36.9 C) (Oral)   Resp 16   SpO2 95%   Physical Exam Vitals and nursing note reviewed.  Constitutional:      General: He is not in acute distress.    Appearance: He is well-developed.     Comments: Disheveled, sleeping  HENT:     Head: Normocephalic and atraumatic.  Eyes:     Conjunctiva/sclera: Conjunctivae normal.  Cardiovascular:     Rate and Rhythm:  Normal rate and regular rhythm.     Heart sounds: No murmur heard. Pulmonary:     Effort: Pulmonary effort is normal. No respiratory distress.     Breath sounds: Normal breath sounds.  Abdominal:     Palpations: Abdomen is soft.     Tenderness: There is no abdominal tenderness.  Musculoskeletal:        General: No swelling.     Cervical back: Neck supple.  Skin:    General: Skin is warm and dry.     Capillary Refill: Capillary refill takes less than 2 seconds.  Neurological:     General: No focal deficit present.     Mental Status: He is alert.     (all labs ordered are listed, but only abnormal results are displayed) Labs Reviewed   CBC WITH DIFFERENTIAL/PLATELET - Abnormal; Notable for the following components:      Result Value   WBC 2.9 (*)    RBC 4.05 (*)    Hemoglobin 11.8 (*)    HCT 37.3 (*)    RDW 18.8 (*)    Platelets 95 (*)    Neutro Abs 1.3 (*)    All other components within normal limits  COMPREHENSIVE METABOLIC PANEL WITH GFR - Abnormal; Notable for the following components:   Glucose, Bld 101 (*)    Creatinine, Ser 0.48 (*)    Calcium  8.4 (*)    AST 52 (*)    All other components within normal limits  ETHANOL - Abnormal; Notable for the following components:   Alcohol , Ethyl (B) 441 (*)    All other components within normal limits  MAGNESIUM   URINE DRUG SCREEN  CBG MONITORING, ED    EKG: EKG Interpretation Date/Time:  Tuesday August 29 2024 13:53:19 EDT Ventricular Rate:  93 PR Interval:  139 QRS Duration:  103 QT Interval:  366 QTC Calculation: 456 R Axis:   82  Text Interpretation: Sinus rhythm Compared with prior EKG from 08/08/2024 Confirmed by Gennaro Bouchard ([id number]) on 08/29/2024 4:24:40 PM  Radiology: [facility] Hip Unilat W or Wo Pelvis 2-3 Views Left Result Date: 08/29/2024 EXAM: 2 or 3 VIEW(S) XRAY OF THE LEFT HIP 08/29/2024 03:12:51 PM COMPARISON: None available. CLINICAL HISTORY: Left sided hip pain post fall. FINDINGS: No acute fracture or dislocation. Femoral heads are seated within the acetabula. Sacroiliac joints and pubic symphysis are anatomically aligned. No significant degenerative changes. IMPRESSION: 1. No acute osseous abnormality. Electronically signed by: Harrietta Sherry MD 08/29/2024 03:38 PM EDT RP Workstation: [id number]     Procedures   Medications Ordered in the ED - No data to display                                  Medical Decision Making Social determinants of health: Patient with history of alcohol  abuse and homelessness  Patient is well-known to this emergency department is here quite frequently for ongoing symptoms and has a history of alcohol   abuse.  Hip x-ray negative and lab workup unremarkable.  Patient's alcohol  level was elevated, however he is clinically sober, able to eat and drink and ambulate without difficulty seems to be back to his baseline.  He will be discharged.  He is agreeable to plan.  All results were discussed with him.  Problems Addressed: Alcohol  abuse: chronic illness or injury Fall, initial encounter: acute illness or injury Hip pain, unspecified laterality: acute illness or injury Homelessness: chronic illness or injury  Amount and/or Complexity of Data Reviewed External Data Reviewed: notes.    Details: Prior ED records reviewed and patient here multiple times in the last couple weeks  Labs: ordered. Decision-making details documented in ED Course.    Details: Ordered and unremarkable except for elevated alcohol  level Radiology: ordered and independent interpretation performed. Decision-making details documented in ED Course.    Details: Ordered and inter by me independently radiology Hip x-ray: Shows no acute fracture or bony abnormality ECG/medicine tests: ordered and independent interpretation performed. Decision-making details documented in ED Course.    Details: Ordered and inter by me in the absence of cardiology shows sinus rhythm, no STEMI or acute change when compared to prior  Risk OTC drugs. Prescription drug management. Diagnosis or treatment significantly limited by social determinants of health.     Final diagnoses:  Hip pain, unspecified laterality  Fall, initial encounter  Alcohol  abuse  Homelessness    ED Discharge Orders     None          Gennaro Duwaine CROME, DO 08/29/24 2232

## 2024-08-29 NOTE — ED Provider Triage Note (Signed)
 Emergency Medicine Provider Triage Evaluation Note  Gary [name] , a 38 y.o. male  was evaluated in triage.  Pt complains of alcohol  withdrawals and L hip after mechanical fall saying some ass hole tripped me. Was caught stealing at [employer]. Reports hand tremors with last drink was this AM, 102 oz of beer.   Endorses nausea, headache, chest pain, shortness of breath  Patient is aggressive in questioning, swearing.   Review of Systems  Positive: N/a Negative: N/a  Physical Exam  BP 106/80 (BP Location: Left Arm)   Pulse 81   Temp 97.6 F (36.4 C) (Oral)   Resp 16   SpO2 100%  Gen:   Awake, no distress   Resp:  Normal effort  MSK:   Moves extremities without difficulty  Other:    Medical Decision Making  Medically screening exam initiated at 1:32 PM.  Appropriate orders placed.  Gary [name] was informed that the remainder of the evaluation will be completed by another provider, this initial triage assessment does not replace that evaluation, and the importance of remaining in the ED until their evaluation is complete.     Gary [name], [place] 08/29/24 [phone]

## 2024-08-29 NOTE — ED Triage Notes (Signed)
 BIBA after drinking approx. 102 oz of beer- was caught stealing at [employer]. Pt has c/o left hip pain from a fall 2 days ago. 124/80 bp 89 hr 16 rr 98% r/a 104 cbg

## 2024-08-30 ENCOUNTER — Other Ambulatory Visit: Payer: Self-pay

## 2024-08-30 NOTE — ED Provider Notes (Signed)
 [place] EMERGENCY DEPARTMENT AT [facility] Provider Note   CSN: [id number] Arrival date & time: 08/29/24  2252     Patient presents with: Leg Pain   Gary [name] is a 38 y.o. male.   The history is provided by the patient.  Leg Pain Location:  Hip Time since incident: days. Injury: no   Hip location:  L hip Pain details:    Quality:  Aching   Radiates to:  Does not radiate   Severity:  Moderate   Onset quality:  Gradual   Timing:  Constant   Progression:  Unchanged Foreign body present:  No foreign bodies Relieved by:  Nothing Worsened by:  Nothing Ineffective treatments:  None tried Associated symptoms: no back pain   Risk factors: no concern for non-accidental trauma   Patient with h/o ETOH abuse is here for left hip pain and intoxication.  He denies SI and Hi.  I just need to sleep'.  Seen earlier for the same. No change.      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 Authorizing Provider  bictegravir-emtricitabine -tenofovir  AF (BIKTARVY ) 50-200-25 MG TABS tablet Take 1 tablet by mouth daily. 02/24/24   Von Bellis, MD  dolutegravir  (TIVICAY ) 50 MG tablet Take 1 tablet (50 mg total) by mouth 2 (two) times daily. 08/03/24   Marylu Gee, DO  emtricitabine -tenofovir  (TRUVADA ) 200-300 MG tablet Take 1 tablet by mouth daily. 08/04/24   Marylu Gee, DO  folic acid  (FOLVITE ) 1 MG tablet Take 1 tablet (1 mg total) by mouth daily. 05/27/24   Amin, Sumayya, MD  gabapentin  (NEURONTIN ) 100 MG capsule Take 1 capsule (100 mg total) by mouth at bedtime. Patient taking differently: Take 100 mg by mouth 3  (three) times daily. 06/28/24   Tobie Yetta HERO, MD  levETIRAcetam  (KEPPRA ) 500 MG tablet Take 1 tablet (500 mg total) by mouth 2 (two) times daily. 06/28/24   Tobie Yetta HERO, MD  lipase/protease/amylase (CREON ) 36000 UNITS CPEP capsule Take 1 capsule (36,000 Units total) by mouth 3 (three) times daily before meals. 05/26/24   Caleen Qualia, MD  naltrexone  (DEPADE) 50 MG tablet Take 1 tablet (50 mg total) by mouth daily. 08/03/24   Marylu Gee, DO  ondansetron  (ZOFRAN ) 4 MG tablet Take 1 tablet (4 mg total) by mouth every 8 (eight) hours as needed for nausea or vomiting. 08/03/24   Marylu Gee, DO  pantoprazole  (PROTONIX ) 40 MG tablet Take 1 tablet (40 mg total) by mouth daily. 06/29/24   Tobie Yetta HERO, MD  PHENobarbital  (LUMINAL) 32.4 MG tablet Take 2 tablets (64.8 mg total) by mouth every 8 (eight) hours for 2 days, THEN 1 tablet (32.4 mg total) every 8 (eight) hours for 2 days. 08/08/24 08/12/24  Laurice Maude BROCKS, MD  sulfamethoxazole -trimethoprim  (BACTRIM  DS) 800-160 MG tablet Take 1 tablet by mouth every Monday, Wednesday, and Friday. 08/04/24   Marylu Gee, DO  thiamine  (VITAMIN B1) 100 MG tablet Take 1 tablet (100 mg total) by mouth daily. 06/28/24   Tobie Yetta HERO, MD  famotidine  (PEPCID ) 20 MG tablet Take  1 tablet (20 mg total) by mouth 2 (two) times daily. Patient not taking: Reported on 06/01/2019 05/14/19 06/02/19  [name], MD    Allergies: Risperidone  and paliperidone, Tegretol  [carbamazepine ], and Caffeine    Review of Systems  Musculoskeletal:  Positive for arthralgias. Negative for back pain.  Psychiatric/Behavioral:  Negative for decreased concentration, dysphoric mood, self-injury, sleep disturbance and suicidal ideas. The patient is not nervous/anxious.   All other systems reviewed and are negative.   Updated Vital Signs BP 116/73   Pulse (!) 101   Temp 98.4 F (36.9 C) (Oral)   Resp 15   Ht 6' (1.829 m)   Wt 66 kg   SpO2 94%   BMI 19.73 kg/m   Physical  Exam Vitals and nursing note reviewed.  Constitutional:      General: He is not in acute distress.    Appearance: He is well-developed. He is not diaphoretic.  HENT:     Head: Normocephalic and atraumatic.  Eyes:     Conjunctiva/sclera: Conjunctivae normal.     Pupils: Pupils are equal, round, and reactive to light.  Cardiovascular:     Rate and Rhythm: Normal rate and regular rhythm.  Pulmonary:     Effort: Pulmonary effort is normal.     Breath sounds: Normal breath sounds. No wheezing or rales.  Abdominal:     General: Bowel sounds are normal.     Palpations: Abdomen is soft.     Tenderness: There is no abdominal tenderness. There is no guarding or rebound.  Musculoskeletal:        General: No tenderness. Normal range of motion.     Cervical back: Normal range of motion and neck supple.     Right lower leg: No edema.     Left lower leg: No edema.  Skin:    General: Skin is warm and dry.     Capillary Refill: Capillary refill takes less than 2 seconds.  Neurological:     General: No focal deficit present.     Mental Status: He is alert and oriented to person, place, and time. Mental status is at baseline.  Psychiatric:        Mood and Affect: Mood is not depressed. Affect is not labile or flat.        Thought Content: Thought content does not include homicidal or suicidal ideation. Thought content does not include homicidal or suicidal plan.     (all labs ordered are listed, but only abnormal results are displayed) Labs Reviewed  URINE DRUG SCREEN    EKG: None  Radiology: DG Hip Unilat W or Wo Pelvis 2-3 Views Left Result Date: 08/29/2024 EXAM: 2 or 3 VIEW(S) XRAY OF THE LEFT HIP 08/29/2024 03:12:51 PM COMPARISON: None available. CLINICAL HISTORY: Left sided hip pain post fall. FINDINGS: No acute fracture or dislocation. Femoral heads are seated within the acetabula. Sacroiliac joints and pubic symphysis are anatomically aligned. No significant degenerative changes.  IMPRESSION: 1. No acute osseous abnormality. Electronically signed by: Shahmeer Lateef MD 08/29/2024 03:38 PM EDT RP Workstation: [id number]     Procedures   Medications Ordered in the ED  ketorolac  (TORADOL ) injection 30 mg (30 mg Intramuscular Given 08/29/24 2338)                                    Medical Decision Making Patient here intoxicated and with ongoing hip pain seen previous shift for same  Amount and/or Complexity of Data Reviewed Independent Historian: EMS    Details: See above  External Data Reviewed: labs, radiology and notes.    Details: Previous ED visit/ labs  and Xray reviewed. Intoxicated  Labs: ordered.  Risk Prescription drug management. Risk Details: Patient is sleeping soundly but easily arousable to verbal stimuli.  Denies SI and HI.  I do not believe this patient needs further labs nor does he need a TS consult.  He was given medication and continues to rest without complaints.  Stable for discharge with close follow up.      Final diagnoses:  Hip pain, unspecified laterality  Alcoholic intoxication without complication    The patient is nontoxic-appearing on exam and vital signs are within normal limits.  I have reviewed the triage vital signs and the nursing notes. Pertinent labs & imaging results that were available during my care of the patient were reviewed by me and considered in my medical decision making (see chart for details). After history, exam, and medical workup I feel the patient has been appropriately medically screened and is safe for discharge home. Pertinent diagnoses were discussed with the patient. Patient was given return precautions.  ED Discharge Orders     None          Garrell Flagg, MD 08/30/24 [phone]

## 2024-08-30 NOTE — Progress Notes (Signed)
 B[facility]tar[facility]  has not been filled since July. Multiple missed appointments. Disenrolled, Farmer aware.

## 2024-08-31 ENCOUNTER — Emergency Department (HOSPITAL_COMMUNITY): Payer: MEDICAID

## 2024-08-31 ENCOUNTER — Emergency Department (HOSPITAL_COMMUNITY)
Admission: EM | Admit: 2024-08-31 | Discharge: 2024-09-01 | Disposition: A | Discharge status: Home / Self Care | Payer: MEDICAID

## 2024-08-31 DIAGNOSIS — F1010 Alcohol abuse, uncomplicated: Secondary | ICD-10-CM | POA: Insufficient documentation

## 2024-08-31 DIAGNOSIS — Z21 Asymptomatic human immunodeficiency virus [HIV] infection status: Secondary | ICD-10-CM | POA: Diagnosis not present

## 2024-08-31 DIAGNOSIS — M25552 Pain in left hip: Secondary | ICD-10-CM | POA: Insufficient documentation

## 2024-08-31 LAB — CBG MONITORING, ED: Glucose-Capillary: 167 mg/dL — ABNORMAL HIGH (ref 70–99)

## 2024-08-31 NOTE — Discharge Instructions (Addendum)
 You were evaluated in the Emergency Department and after careful evaluation, we did not find any emergent condition requiring admission or further testing in the hospital.  Your exam/testing today is overall reassuring.  We encourage you to continue your sobriety efforts.  Recommend stopping alcohol  and taking the Librium  taper as prescribed.  You can slightly adjust the dosing based on the severity of your withdrawal symptoms.  Recommend follow-up with behavioral health for further resources.  Very important that you do not mix alcohol  with the Librium , this would be extremely dangerous to your health.  Please return to the Emergency Department if you experience any worsening of your condition.   Thank you for allowing us  to be a part of your care.

## 2024-08-31 NOTE — ED Provider Notes (Signed)
 [place] EMERGENCY DEPARTMENT AT [facility] Provider Note   CSN: [id number] Arrival date & time: 08/31/24  2044     Patient presents with: Alcohol  Intoxication and Hip Pain   Gary [name] is a 38 y.o. male.   This is a 38 year old male presenting emergency department complaining of pain to his left hip.  Reports that he fell a week ago onto his left knee, fell again this evening onto his left hip.  Reports alcohol  use.  He did hit his head.  No chest pain or shortness of breath, no abdominal pain.   Alcohol  Intoxication  Hip Pain       Prior to Admission medications   Medication Sig Start Date End Date Taking? Authorizing Provider  bictegravir-emtricitabine -tenofovir  AF (BIKTARVY ) 50-200-25 MG TABS tablet Take 1 tablet by mouth daily. 02/24/24   Von Bellis, MD  dolutegravir  (TIVICAY ) 50 MG tablet Take 1 tablet (50 mg total) by mouth 2 (two) times daily. 08/03/24   Marylu Gee, DO  emtricitabine -tenofovir  (TRUVADA ) 200-300 MG tablet Take 1 tablet by mouth daily. 08/04/24   Marylu Gee, DO  folic acid  (FOLVITE ) 1 MG tablet Take 1 tablet (1 mg total) by mouth daily. 05/27/24   Amin, Sumayya, MD  gabapentin  (NEURONTIN ) 100 MG capsule Take 1 capsule (100 mg total) by mouth at bedtime. Patient taking differently: Take 100 mg by mouth 3 (three) times daily. 06/28/24   Tobie Yetta HERO, MD  levETIRAcetam  (KEPPRA ) 500 MG tablet Take 1 tablet (500 mg total) by mouth 2 (two) times daily. 06/28/24   Tobie Yetta HERO, MD  lipase/protease/amylase (CREON ) 36000 UNITS CPEP capsule Take 1 capsule (36,000 Units total) by mouth 3 (three) times daily before meals. 05/26/24   Caleen Qualia, MD  naltrexone  (DEPADE) 50 MG tablet Take 1 tablet (50 mg total) by mouth daily. 08/03/24   Marylu Gee, DO  ondansetron  (ZOFRAN ) 4 MG tablet Take 1 tablet (4 mg total) by mouth every 8 (eight) hours as needed for nausea or vomiting. 08/03/24   Marylu Gee, DO  pantoprazole  (PROTONIX ) 40 MG  tablet Take 1 tablet (40 mg total) by mouth daily. 06/29/24   Tobie Yetta HERO, MD  PHENobarbital  (LUMINAL) 32.4 MG tablet Take 2 tablets (64.8 mg total) by mouth every 8 (eight) hours for 2 days, THEN 1 tablet (32.4 mg total) every 8 (eight) hours for 2 days. 08/08/24 08/12/24  Laurice Maude BROCKS, MD  sulfamethoxazole -trimethoprim  (BACTRIM  DS) 800-160 MG tablet Take 1 tablet by mouth every Monday, Wednesday, and Friday. 08/04/24   Marylu Gee, DO  thiamine  (VITAMIN B1) 100 MG tablet Take 1 tablet (100 mg total) by mouth daily. 06/28/24   Tobie Yetta HERO, MD  famotidine  (PEPCID ) 20 MG tablet Take 1 tablet (20 mg total) by mouth 2 (two) times daily. Patient not taking: Reported on 06/01/2019 05/14/19 06/02/19  [name], MD    Allergies: Risperidone  and paliperidone, Tegretol  [carbamazepine ], and Caffeine    Review of Systems  Updated Vital Signs BP 125/69   Pulse (!) 107   Temp 98.7 F (37.1 C) (Oral)   Resp 15   SpO2 97%   Physical Exam Vitals and nursing note reviewed.  Constitutional:      General: He is not in acute distress.    Appearance: He is not toxic-appearing.  HENT:     Head: Normocephalic.     Nose: Nose normal.     Mouth/Throat:     Mouth: Mucous membranes are moist.  Eyes:  Conjunctiva/sclera: Conjunctivae normal.  Cardiovascular:     Rate and Rhythm: Normal rate and regular rhythm.  Pulmonary:     Effort: Pulmonary effort is normal.     Breath sounds: Normal breath sounds.  Abdominal:     General: Abdomen is flat. There is no distension.     Tenderness: There is no abdominal tenderness. There is no guarding or rebound.  Musculoskeletal:     Comments: Tenderness to the left hip on exam, no other bony tenderness to the lower extremities.  Equal pulses.  Normal sensation.  Skin:    General: Skin is warm.     Capillary Refill: Capillary refill takes less than 2 seconds.  Neurological:     Mental Status: He is alert.     Comments: No gross motor deficits.   However exam limited to patient's intoxication.  Psychiatric:        Mood and Affect: Mood normal.        Behavior: Behavior normal.     (all labs ordered are listed, but only abnormal results are displayed) Labs Reviewed  CBG MONITORING, ED - Abnormal; Notable for the following components:      Result Value   Glucose-Capillary 167 (*)    All other components within normal limits    EKG: None  Radiology: CT PELVIS WO CONTRAST Result Date: 08/31/2024 CLINICAL DATA:  Right hip pain EXAM: CT PELVIS WITHOUT CONTRAST TECHNIQUE: Multidetector CT imaging of the pelvis was performed following the standard protocol without intravenous contrast. RADIATION DOSE REDUCTION: This exam was performed according to the departmental dose-optimization program which includes automated exposure control, adjustment of the mA and/or kV according to patient size and/or use of iterative reconstruction technique. COMPARISON:  08/29/2024 FINDINGS: Urinary Tract:  Bladder is well distended. Bowel: No obstructive or inflammatory changes of colon are seen. Small bowel and appendix are within normal limits. Diverticular changes seen without diverticulitis. Vascular/Lymphatic: No pathologically enlarged lymph nodes. No significant vascular abnormality seen. Reproductive:  Prostate is within normal limits. Other:  No free fluid is noted. Musculoskeletal: No acute abnormality noted. Electronically Signed   By: Oneil Devonshire M.D.   On: 08/31/2024 23:41   CT Head Wo Contrast Result Date: 08/31/2024 CLINICAL DATA:  Recent injury with headaches, initial encounter EXAM: CT HEAD WITHOUT CONTRAST TECHNIQUE: Contiguous axial images were obtained from the base of the skull through the vertex without intravenous contrast. RADIATION DOSE REDUCTION: This exam was performed according to the departmental dose-optimization program which includes automated exposure control, adjustment of the mA and/or kV according to patient size and/or use  of iterative reconstruction technique. COMPARISON:  08/15/2024 FINDINGS: Brain: No evidence of acute infarction, hemorrhage, hydrocephalus, extra-axial collection or mass lesion/mass effect. Mild atrophic changes are noted stable from the prior exam. Vascular: No hyperdense vessel or unexpected calcification. Skull: Normal. Negative for fracture or focal lesion. Sinuses/Orbits: No acute finding. Other: None. IMPRESSION: Chronic atrophic changes without acute abnormality. Electronically Signed   By: Oneil Devonshire M.D.   On: 08/31/2024 23:33     Procedures   Medications Ordered in the ED - No data to display  Clinical Course as of 08/31/24 2358  Thu Aug 31, 2024  2337 CT Head Wo Contrast IMPRESSION: Chronic atrophic changes without acute abnormality.   Electronically Signed   By: Oneil Devonshire M.D.   On: 08/31/2024 23:33   [TY]  2345 CT PELVIS WO CONTRAST INDINGS: Urinary Tract:  Bladder is well distended.  Bowel: No obstructive or inflammatory changes of  colon are seen. Small bowel and appendix are within normal limits. Diverticular changes seen without diverticulitis.  Vascular/Lymphatic: No pathologically enlarged lymph nodes. No significant vascular abnormality seen.  Reproductive:  Prostate is within normal limits.  Other:  No free fluid is noted.  Musculoskeletal: No acute abnormality noted.     [TY]  2357 Imaging negative.  Plan to observe for clinical sobriety. Care signed out to overnight team.  [TY]    Clinical Course User Index [TY] Neysa Caron PARAS, DO                                 Medical Decision Making This a 38 year old male with HIV, alcohol  abuse, history of subdural hematoma, seizures, bipolar/schizophrenia frequency emergency department.  Seen couple days ago for similar hip pain negative x-ray.  Reports that he fell again today and struck his head.  He is clinically intoxicated, but no gross neurodeficits.  Given his intoxication will get CT head.   Will also get CT pelvis to evaluate for occult fracture.  Will also get CBG.    Amount and/or Complexity of Data Reviewed External Data Reviewed:     Details: Negative x-ray of pelvis few days ago Labs:     Details: Had negative labs 2 days ago.  He readily admits to alcohol  abuse today. Radiology: ordered. Decision-making details documented in ED Course.  Risk Decision regarding hospitalization. Diagnosis or treatment significantly limited by social determinants of health. Risk Details: Alcohol  abuse       Final diagnoses:  ETOH abuse  Left hip pain    ED Discharge Orders     None          Neysa Caron PARAS, DO 08/31/24 2358

## 2024-08-31 NOTE — ED Triage Notes (Signed)
 BIB EMS from [facility].  ETOH use tonight. No other substances per pt.  Right hip pain

## 2024-09-01 ENCOUNTER — Other Ambulatory Visit: Payer: Self-pay

## 2024-09-01 ENCOUNTER — Other Ambulatory Visit (HOSPITAL_COMMUNITY): Payer: Self-pay

## 2024-09-01 ENCOUNTER — Emergency Department (HOSPITAL_COMMUNITY)
Admission: EM | Admit: 2024-09-01 | Discharge: 2024-09-02 | Disposition: A | Discharge status: Home / Self Care | Payer: MEDICAID | Attending: Emergency Medicine | Admitting: Emergency Medicine

## 2024-09-01 ENCOUNTER — Encounter (HOSPITAL_COMMUNITY): Payer: Self-pay | Admitting: Emergency Medicine

## 2024-09-01 DIAGNOSIS — M25551 Pain in right hip: Secondary | ICD-10-CM | POA: Insufficient documentation

## 2024-09-01 DIAGNOSIS — Z765 Malingerer [conscious simulation]: Secondary | ICD-10-CM | POA: Insufficient documentation

## 2024-09-01 DIAGNOSIS — W1839XA Other fall on same level, initial encounter: Secondary | ICD-10-CM | POA: Insufficient documentation

## 2024-09-01 MED ORDER — CHLORDIAZEPOXIDE HCL 25 MG PO CAPS
50.0000 mg | ORAL_CAPSULE | Freq: Once | ORAL | Status: AC
  Administered 2024-09-01: 50 mg via ORAL
  Filled 2024-09-01: qty 2

## 2024-09-01 MED ORDER — CHLORDIAZEPOXIDE HCL 25 MG PO CAPS
ORAL_CAPSULE | ORAL | 0 refills | Status: DC
  Filled 2024-09-01: qty 52, 7d supply, fill #0

## 2024-09-01 NOTE — ED Provider Notes (Signed)
  Provider Note MRN:  [id number]  Arrival date & time: 09/01/24    ED Course and Medical Decision Making  Assumed care of patient at sign-out or upon transfer.  Alcohol  intoxication, question of trauma.  Reassuring CTs, awaiting further metabolization and reassessment.  3:30 AM update: Patient doing much better, medical workup is reassuring.  He explains that he very much would like to quit drinking.  He has tried Librium  in the past but it did not seem to work.  He tells me that he drinks over 2 fifths of liquor every day and so I suspect that the typical Librium  taper set is Default within the system is an adequate.  It looks like that taper has a starting dose of 50 mg, I suspect he needs the higher dosing requirement, would be recommended initial dose going as high as 100 mg per the literature.  Providing with prescription, return precautions discussed.  Procedures  Final Clinical Impressions(s) / ED Diagnoses     ICD-10-CM   1. ETOH abuse  F10.10     2. Left hip pain  M25.552       ED Discharge Orders          Ordered    chlordiazePOXIDE  (LIBRIUM ) 25 MG capsule        09/01/24 0331              Discharge Instructions      You were evaluated in the Emergency Department and after careful evaluation, we did not find any emergent condition requiring admission or further testing in the hospital.  Your exam/testing today is overall reassuring.  We encourage you to continue your sobriety efforts.  Recommend stopping alcohol  and taking the Librium  taper as prescribed.  You can slightly adjust the dosing based on the severity of your withdrawal symptoms.  Recommend follow-up with behavioral health for further resources.  Very important that you do not mix alcohol  with the Librium , this would be extremely dangerous to your health.  Please return to the Emergency Department if you experience any worsening of your condition.   Thank you for allowing us  to be a part of your  care.    Ozell HERO. Theadore, MD [facility] Emergency Medicine [facility] [email or web address]    Theadore Ozell HERO, MD 09/01/24 [phone]

## 2024-09-01 NOTE — ED Provider Notes (Signed)
 [place] [facility] Provider Note   CSN: [id number] Arrival date & time: 09/01/24  2023     Patient presents with: Fall and Hip Pain   Gary [name] is a 38 y.o. male who is well known to our department with frequent visits who presents again this evening with acute on chronic hip pain. He was discharged from [facility] ED less than 24 hours ago after evaluation for the same complaint. Reassuring  Cts; patient also discharged with high dose libirum taper which he has not yet picked up. Patient denies any new complaint, resistant to conversing with this provider. Wanting to be left alone to sleep. Answering all questions appropriately and oriented.    HPI     Prior to Admission medications   Medication Sig Start Date End Date Taking? Authorizing Provider  bictegravir-emtricitabine -tenofovir  AF (BIKTARVY ) 50-200-25 MG TABS tablet Take 1 tablet by mouth daily. 02/24/24   Von Bellis, MD  chlordiazePOXIDE  (LIBRIUM ) 25 MG capsule Day 1 take four tablets every 4-6 hours.  Day 2 take three tablets every 4-6 hours.  Day 3 take two tablets every 4-6 hours.  Day 4 take two tablets every 6-8 hours.  Day 5 take two tablets every 12 hours.  Day 6 take one tablets every 12 hours.  Day 7 stop. 09/01/24   Theadore Ozell HERO, MD  dolutegravir  (TIVICAY ) 50 MG tablet Take 1 tablet (50 mg total) by mouth 2 (two) times daily. 08/03/24   Marylu Gee, DO  emtricitabine -tenofovir  (TRUVADA ) 200-300 MG tablet Take 1 tablet by mouth daily. 08/04/24   Marylu Gee, DO  folic acid  (FOLVITE ) 1 MG tablet Take 1 tablet (1 mg total) by mouth daily. 05/27/24   Amin, Sumayya, MD  gabapentin  (NEURONTIN ) 100 MG capsule Take 1 capsule (100 mg total) by mouth at bedtime. Patient taking differently: Take 100 mg by mouth 3 (three) times daily. 06/28/24   Tobie Yetta HERO, MD  levETIRAcetam  (KEPPRA ) 500 MG tablet Take 1 tablet (500 mg total) by mouth 2 (two) times daily. 06/28/24   Tobie Yetta HERO, MD   lipase/protease/amylase (CREON ) 36000 UNITS CPEP capsule Take 1 capsule (36,000 Units total) by mouth 3 (three) times daily before meals. 05/26/24   Caleen Qualia, MD  naltrexone  (DEPADE) 50 MG tablet Take 1 tablet (50 mg total) by mouth daily. 08/03/24   Marylu Gee, DO  ondansetron  (ZOFRAN ) 4 MG tablet Take 1 tablet (4 mg total) by mouth every 8 (eight) hours as needed for nausea or vomiting. 08/03/24   Marylu Gee, DO  pantoprazole  (PROTONIX ) 40 MG tablet Take 1 tablet (40 mg total) by mouth daily. 06/29/24   Tobie Yetta HERO, MD  PHENobarbital  (LUMINAL) 32.4 MG tablet Take 2 tablets (64.8 mg total) by mouth every 8 (eight) hours for 2 days, THEN 1 tablet (32.4 mg total) every 8 (eight) hours for 2 days. 08/08/24 08/12/24  Laurice Maude BROCKS, MD  sulfamethoxazole -trimethoprim  (BACTRIM  DS) 800-160 MG tablet Take 1 tablet by mouth every Monday, Wednesday, and Friday. 08/04/24   Marylu Gee, DO  thiamine  (VITAMIN B1) 100 MG tablet Take 1 tablet (100 mg total) by mouth daily. 06/28/24   Tobie Yetta HERO, MD  famotidine  (PEPCID ) 20 MG tablet Take 1 tablet (20 mg total) by mouth 2 (two) times daily. Patient not taking: Reported on 06/01/2019 05/14/19 06/02/19  [name], MD    Allergies: Risperidone  and paliperidone, Tegretol  [carbamazepine ], and Caffeine    Review of Systems  Musculoskeletal:  Hip pain    Updated Vital Signs BP 101/84   Pulse 81   Temp 97.9 F (36.6 C) (Oral)   Resp 20   Ht 6' (1.829 m)   Wt 66 kg   SpO2 100%   BMI 19.73 kg/m   Physical Exam Vitals and nursing note reviewed.  Constitutional:      Appearance: He is not toxic-appearing.     Comments: Chronically ill-appearing.   HENT:     Head: Normocephalic and atraumatic.  Eyes:     General: No scleral icterus.       Right eye: No discharge.        Left eye: No discharge.     Conjunctiva/sclera: Conjunctivae normal.  Pulmonary:     Effort: Pulmonary effort is normal.  Genitourinary:    Comments:  Patient not cooperative for MSK exam. Ambulatory in the ED.  Skin:    General: Skin is warm and dry.  Neurological:     General: No focal deficit present.     Mental Status: He is alert.  Psychiatric:        Mood and Affect: Mood normal.     (all labs ordered are listed, but only abnormal results are displayed) Labs Reviewed - No data to display  EKG: None  Radiology: CT PELVIS WO CONTRAST Result Date: 08/31/2024 CLINICAL DATA:  Right hip pain EXAM: CT PELVIS WITHOUT CONTRAST TECHNIQUE: Multidetector CT imaging of the pelvis was performed following the standard protocol without intravenous contrast. RADIATION DOSE REDUCTION: This exam was performed according to the departmental dose-optimization program which includes automated exposure control, adjustment of the mA and/or kV according to patient size and/or use of iterative reconstruction technique. COMPARISON:  08/29/2024 FINDINGS: Urinary Tract:  Bladder is well distended. Bowel: No obstructive or inflammatory changes of colon are seen. Small bowel and appendix are within normal limits. Diverticular changes seen without diverticulitis. Vascular/Lymphatic: No pathologically enlarged lymph nodes. No significant vascular abnormality seen. Reproductive:  Prostate is within normal limits. Other:  No free fluid is noted. Musculoskeletal: No acute abnormality noted. Electronically Signed   By: Oneil Devonshire M.D.   On: 08/31/2024 23:41   CT Head Wo Contrast Result Date: 08/31/2024 CLINICAL DATA:  Recent injury with headaches, initial encounter EXAM: CT HEAD WITHOUT CONTRAST TECHNIQUE: Contiguous axial images were obtained from the base of the skull through the vertex without intravenous contrast. RADIATION DOSE REDUCTION: This exam was performed according to the departmental dose-optimization program which includes automated exposure control, adjustment of the mA and/or kV according to patient size and/or use of iterative reconstruction technique.  COMPARISON:  08/15/2024 FINDINGS: Brain: No evidence of acute infarction, hemorrhage, hydrocephalus, extra-axial collection or mass lesion/mass effect. Mild atrophic changes are noted stable from the prior exam. Vascular: No hyperdense vessel or unexpected calcification. Skull: Normal. Negative for fracture or focal lesion. Sinuses/Orbits: No acute finding. Other: None. IMPRESSION: Chronic atrophic changes without acute abnormality. Electronically Signed   By: Oneil Devonshire M.D.   On: 08/31/2024 23:33     Procedures   Medications Ordered in the ED - No data to display                                  Medical Decision Making 38 y/o male with HIV, ETOH abuse, and hip pain.   VS reassuring on intake, cardiopulmonary exam unremarkable. Patient alert and oriented, uncooperative with any further exam. As patient had reassuring  workup less than 24 hours ago without new complaint this evening, clinical concern for emergent underlying etiology that would warrant further ED workup or inpatient management is exceedingly low.   Patient received prescription for libirum earlier today, which he may pick up. Will provide him with a list of outpatient substance use resources as well. Patient poorly cooperative with this provider throughout his stay, however in no acute distress and with reassuring VS.    Koren  voiced understanding of his medical evaluation and treatment plan. Each of their questions answered to their expressed satisfaction.  Return precautions were given.  Patient is well-appearing, stable, and was discharged in good condition.  This chart was dictated using voice recognition software, Dragon. Despite the best efforts of this provider to proofread and correct errors, errors may still occur which can change documentation meaning.     Final diagnoses:  Malingering    ED Discharge Orders     None          Bobette Pleasant JONELLE DEVONNA 09/02/24 0014    Tegeler, Lonni PARAS,  MD 09/06/24 [phone]

## 2024-09-01 NOTE — ED Notes (Signed)
 Pt starting to shake, requesting medication. Provider aware.

## 2024-09-01 NOTE — ED Triage Notes (Signed)
 [facility] after pt states that he had a fall and has left hip pain and lac noted to rt pinky. EMS reports no shortening or rotation to left leg. Pt able to bear weight on scene. Pt states that he would like to go to rehab and states that he has a hx of HIV. Pt is paranoid and mildly aggressive verbally.   BP 132/palp HR 74 Resp 16 Spo2 98%  CBG 115

## 2024-09-02 ENCOUNTER — Other Ambulatory Visit (HOSPITAL_COMMUNITY): Payer: Self-pay

## 2024-09-02 ENCOUNTER — Other Ambulatory Visit: Payer: Self-pay

## 2024-09-02 ENCOUNTER — Emergency Department (HOSPITAL_COMMUNITY): Admission: EM | Admit: 2024-09-02 | Discharge: 2024-09-03 | Disposition: A | Payer: MEDICAID

## 2024-09-02 ENCOUNTER — Encounter (HOSPITAL_COMMUNITY): Payer: Self-pay | Admitting: Emergency Medicine

## 2024-09-02 DIAGNOSIS — Z21 Asymptomatic human immunodeficiency virus [HIV] infection status: Secondary | ICD-10-CM | POA: Insufficient documentation

## 2024-09-02 DIAGNOSIS — M25552 Pain in left hip: Secondary | ICD-10-CM | POA: Insufficient documentation

## 2024-09-02 DIAGNOSIS — Z5900 Homelessness unspecified: Secondary | ICD-10-CM | POA: Diagnosis not present

## 2024-09-02 DIAGNOSIS — F10920 Alcohol use, unspecified with intoxication, uncomplicated: Secondary | ICD-10-CM | POA: Diagnosis present

## 2024-09-02 NOTE — ED Notes (Signed)
 While attempting to obtain vitals pt told this NT that he would prefer an RN to complete his care because something is really wrong with me, not to hurt your feelings.. This NT assured pt that his request will be granted. Triage RN notified.

## 2024-09-02 NOTE — ED Triage Notes (Signed)
 Patient coming to ED for evaluation of L hip pain and alcohol  problems. Reports I've been falling all day. Requesting to speak to charge nurse because my head is not right.  When asked if patient had any new symptoms from previous visits, patient stated Are you talking against me now?  Patient informed that question was to insure that symptoms were not acute.

## 2024-09-03 ENCOUNTER — Emergency Department (HOSPITAL_COMMUNITY)
Admission: EM | Admit: 2024-09-03 | Discharge: 2024-09-04 | Disposition: A | Payer: MEDICAID | Attending: Emergency Medicine | Admitting: Emergency Medicine

## 2024-09-03 ENCOUNTER — Other Ambulatory Visit (HOSPITAL_COMMUNITY): Payer: Self-pay

## 2024-09-03 ENCOUNTER — Encounter (HOSPITAL_COMMUNITY): Payer: Self-pay

## 2024-09-03 DIAGNOSIS — F10920 Alcohol use, unspecified with intoxication, uncomplicated: Secondary | ICD-10-CM | POA: Insufficient documentation

## 2024-09-03 DIAGNOSIS — Z21 Asymptomatic human immunodeficiency virus [HIV] infection status: Secondary | ICD-10-CM | POA: Insufficient documentation

## 2024-09-03 DIAGNOSIS — Z5900 Homelessness unspecified: Secondary | ICD-10-CM | POA: Insufficient documentation

## 2024-09-03 MED ORDER — DICLOFENAC SODIUM 1 % EX GEL
4.0000 g | Freq: Four times a day (QID) | CUTANEOUS | 0 refills | Status: DC
  Filled 2024-09-03: qty 100, 30d supply, fill #0

## 2024-09-03 MED ORDER — LIDOCAINE 5 % EX PTCH
1.0000 | MEDICATED_PATCH | CUTANEOUS | 0 refills | Status: DC
  Filled 2024-09-03: qty 30, 30d supply, fill #0

## 2024-09-03 MED ORDER — LIDOCAINE 5 % EX PTCH
1.0000 | MEDICATED_PATCH | CUTANEOUS | Status: DC
  Administered 2024-09-03: 1 via TRANSDERMAL
  Filled 2024-09-03: qty 1

## 2024-09-03 MED ORDER — KETOROLAC TROMETHAMINE 15 MG/ML IJ SOLN
15.0000 mg | Freq: Once | INTRAMUSCULAR | Status: DC
  Filled 2024-09-03: qty 1

## 2024-09-03 NOTE — ED Provider Notes (Signed)
 [place] EMER[facility] DEPARTMENT AT [facility] Provider Note   CSN: [id number] Arrival date & time: 09/03/24  2222     Patient presents with: Alcohol  Intoxication   Gary [name] is a 38 y.o. male.  {Add pertinent medical, surgical, social history, OB history to HPI:32947} HPI     Prior to Admission medications   Medication Sig Start Date End Date Taking? Authorizing Provider  bictegravir-emtricitabine -tenofovir  AF (BIKTARVY ) 50-200-25 MG TABS tablet Take 1 tablet by mouth daily. 02/24/24   Von Bellis, MD  chlordiazePOXIDE  (LIBRIUM ) 25 MG capsule Day 1 take four tablets every 4-6 hours.  Day 2 take three tablets every 4-6 hours.  Day 3 take two tablets every 4-6 hours.  Day 4 take two tablets every 6-8 hours.  Day 5 take two tablets every 12 hours.  Day 6 take one tablets every 12 hours.  Day 7 stop. 09/01/24   Theadore Ozell HERO, MD  diclofenac  Sodium (VOLTAREN ) 1 % GEL Apply 4 g topically 4 (four) times daily. 09/03/24   Ula Prentice SAUNDERS, MD  dolutegravir  (TIVICAY ) 50 MG tablet Take 1 tablet (50 mg total) by mouth 2 (two) times daily. 08/03/24   Marylu Gee, DO  emtricitabine -tenofovir  (TRUVADA ) 200-300 MG tablet Take 1 tablet by mouth daily. 08/04/24   Marylu Gee, DO  folic acid  (FOLVITE ) 1 MG tablet Take 1 tablet (1 mg total) by mouth daily. 05/27/24   Amin, Sumayya, MD  gabapentin  (NEURONTIN ) 100 MG capsule Take 1 capsule (100 mg total) by mouth at bedtime. Patient taking differently: Take 100 mg by mouth 3 (three) times daily. 06/28/24   Tobie Yetta HERO, MD  levETIRAcetam  (KEPPRA ) 500 MG tablet Take 1 tablet (500 mg total) by mouth 2 (two) times daily. 06/28/24   Tobie Yetta HERO, MD  lidocaine  (LIDODERM ) 5 % Place 1 patch onto the skin daily. Remove & Discard patch within 12 hours or as directed by MD 09/03/24   Ula Prentice SAUNDERS, MD  lipase/protease/amylase (CREON ) 36000 UNITS CPEP capsule Take 1 capsule (36,000 Units total) by mouth 3 (three) times daily before meals.  05/26/24   Caleen Qualia, MD  naltrexone  (DEPADE) 50 MG tablet Take 1 tablet (50 mg total) by mouth daily. 08/03/24   Marylu Gee, DO  ondansetron  (ZOFRAN ) 4 MG tablet Take 1 tablet (4 mg total) by mouth every 8 (eight) hours as needed for nausea or vomiting. 08/03/24   Marylu Gee, DO  pantoprazole  (PROTONIX ) 40 MG tablet Take 1 tablet (40 mg total) by mouth daily. 06/29/24   Tobie Yetta HERO, MD  PHENobarbital  (LUMINAL) 32.4 MG tablet Take 2 tablets (64.8 mg total) by mouth every 8 (eight) hours for 2 days, THEN 1 tablet (32.4 mg total) every 8 (eight) hours for 2 days. 08/08/24 08/12/24  Laurice Maude BROCKS, MD  sulfamethoxazole -trimethoprim  (BACTRIM  DS) 800-160 MG tablet Take 1 tablet by mouth every Monday, Wednesday, and Friday. 08/04/24   Marylu Gee, DO  thiamine  (VITAMIN B1) 100 MG tablet Take 1 tablet (100 mg total) by mouth daily. 06/28/24   Tobie Yetta HERO, MD  famotidine  (PEPCID ) 20 MG tablet Take 1 tablet (20 mg total) by mouth 2 (two) times daily. Patient not taking: Reported on 06/01/2019 05/14/19 06/02/19  [name], MD    Allergies: Risperidone  and paliperidone, Tegretol  [carbamazepine ], and Caffeine    Review of Systems  Updated Vital Signs BP 110/71 (BP Location: Left Arm)   Pulse 90   Temp 98.6 F (37 C) (Oral)   Resp 16  SpO2 95%   Physical Exam  (all labs ordered are listed, but only abnormal results are displayed) Labs Reviewed - No data to display  EKG: None  Radiology: No results found.  {Document cardiac monitor, telemetry assessment procedure when appropriate:32947} Procedures   Medications Ordered in the ED - No data to display    {Click here for ABCD2, HEART and other calculators REFRESH Note before signing:1}                              Medical Decision Making  ***  {Document critical care time when appropriate  Document review of labs and clinical decision tools ie CHADS2VASC2, etc  Document your independent review of radiology images and  any outside records  Document your discussion with family members, caretakers and with consultants  Document social determinants of health affecting pt's care  Document your decision making why or why not admission, treatments were needed:32947:::1}   Final diagnoses:  None    ED Discharge Orders     None

## 2024-09-03 NOTE — ED Triage Notes (Signed)
 BIBA from [facility], bystander called EMS because he looked like he was stumbling about to pass out.  140/100 Hr 80 98% RA

## 2024-09-03 NOTE — Discharge Instructions (Signed)
 Please try the lidocaine  patches and Voltaren  gel for your hip.  Follow-up with your doctor and return to the ER for worsening symptoms.

## 2024-09-03 NOTE — ED Notes (Signed)
 Discharge instructions reviewed with patient. Patient given bus pass. Wheeled out with his walker to lobby by NT.

## 2024-09-03 NOTE — ED Provider Notes (Signed)
  EMERGENCY DEPARTMENT AT [facility] Provider Note   CSN: [id number] Arrival date & time: 09/02/24  2103     Patient presents with: Hip Pain   Gary [name] is a 38 y.o. male.   38 year old male with past medical history of alcohol  abuse and HIV on Shiela therapy presenting to the emergency department today with left hip pain.  Patient states this been going on since he fell last week.  States that it is worse with ambulation.  Has not taken any medications for this.  He is been seen in the emergency department a few times regarding this and alcohol  withdrawal.  He came back to the emergency department today for further evaluation for the hip pain.  He denies any new injuries.  Denies any fevers.   Hip Pain       Prior to Admission medications   Medication Sig Start Date End Date Taking? Authorizing Provider  diclofenac  Sodium (VOLTAREN ) 1 % GEL Apply 4 g topically 4 (four) times daily. 09/03/24  Yes Ula Prentice SAUNDERS, MD  lidocaine  (LIDODERM ) 5 % Place 1 patch onto the skin daily. Remove & Discard patch within 12 hours or as directed by MD 09/03/24  Yes Ula Prentice SAUNDERS, MD  bictegravir-emtricitabine -tenofovir  AF (BIKTARVY ) 50-200-25 MG TABS tablet Take 1 tablet by mouth daily. 02/24/24   Von Bellis, MD  chlordiazePOXIDE  (LIBRIUM ) 25 MG capsule Day 1 take four tablets every 4-6 hours.  Day 2 take three tablets every 4-6 hours.  Day 3 take two tablets every 4-6 hours.  Day 4 take two tablets every 6-8 hours.  Day 5 take two tablets every 12 hours.  Day 6 take one tablets every 12 hours.  Day 7 stop. 09/01/24   Theadore Ozell HERO, MD  dolutegravir  (TIVICAY ) 50 MG tablet Take 1 tablet (50 mg total) by mouth 2 (two) times daily. 08/03/24   Marylu Gee, DO  emtricitabine -tenofovir  (TRUVADA ) 200-300 MG tablet Take 1 tablet by mouth daily. 08/04/24   Marylu Gee, DO  folic acid  (FOLVITE ) 1 MG tablet Take 1 tablet (1 mg total) by mouth daily. 05/27/24   Amin, Sumayya, MD   gabapentin  (NEURONTIN ) 100 MG capsule Take 1 capsule (100 mg total) by mouth at bedtime. Patient taking differently: Take 100 mg by mouth 3 (three) times daily. 06/28/24   Tobie Yetta HERO, MD  levETIRAcetam  (KEPPRA ) 500 MG tablet Take 1 tablet (500 mg total) by mouth 2 (two) times daily. 06/28/24   Tobie Yetta HERO, MD  lipase/protease/amylase (CREON ) 36000 UNITS CPEP capsule Take 1 capsule (36,000 Units total) by mouth 3 (three) times daily before meals. 05/26/24   Caleen Qualia, MD  naltrexone  (DEPADE) 50 MG tablet Take 1 tablet (50 mg total) by mouth daily. 08/03/24   Marylu Gee, DO  ondansetron  (ZOFRAN ) 4 MG tablet Take 1 tablet (4 mg total) by mouth every 8 (eight) hours as needed for nausea or vomiting. 08/03/24   Marylu Gee, DO  pantoprazole  (PROTONIX ) 40 MG tablet Take 1 tablet (40 mg total) by mouth daily. 06/29/24   Tobie Yetta HERO, MD  PHENobarbital  (LUMINAL) 32.4 MG tablet Take 2 tablets (64.8 mg total) by mouth every 8 (eight) hours for 2 days, THEN 1 tablet (32.4 mg total) every 8 (eight) hours for 2 days. 08/08/24 08/12/24  Laurice Maude BROCKS, MD  sulfamethoxazole -trimethoprim  (BACTRIM  DS) 800-160 MG tablet Take 1 tablet by mouth every Monday, Wednesday, and Friday. 08/04/24   Marylu Gee, DO  thiamine  (VITAMIN B1) 100 MG  tablet Take 1 tablet (100 mg total) by mouth daily. 06/28/24   Tobie Yetta HERO, MD  famotidine  (PEPCID ) 20 MG tablet Take 1 tablet (20 mg total) by mouth 2 (two) times daily. Patient not taking: Reported on 06/01/2019 05/14/19 06/02/19  [name], MD    Allergies: Risperidone  and paliperidone, Tegretol  [carbamazepine ], and Caffeine    Review of Systems  Musculoskeletal:  Positive for arthralgias.  All other systems reviewed and are negative.   Updated Vital Signs BP 103/64 (BP Location: Right Arm)   Pulse 78   Temp 98.1 F (36.7 C)   Resp 15   SpO2 95%   Physical Exam Vitals and nursing note reviewed.   Gen: NAD Eyes: PERRL, EOMI HEENT: no  oropharyngeal swelling Neck: trachea midline Resp: clear to auscultation bilaterally Card: RRR, no murmurs, rubs, or gallops Abd: nontender, nondistended Extremities: no calf tenderness, no edema, the patient is tender over the left proximal femur with no obvious deformities or overlying erythema, the patient has normal active and passive range of motion Vascular: 2+ radial pulses bilaterally, 2+ DP pulses bilaterally Skin: no rashes Psyc: acting appropriately   (all labs ordered are listed, but only abnormal results are displayed) Labs Reviewed - No data to display  EKG: None  Radiology: No results found.   Procedures   Medications Ordered in the ED  ketorolac  (TORADOL ) 15 MG/ML injection 15 mg (has no administration in time range)  lidocaine  (LIDODERM ) 5 % 1 patch (has no administration in time range)                                    Medical Decision Making 38 year old male with past medical history of alcohol  abuse presenting to emergency department today with hip pain after a fall last week.  The patient has had workup including a CT scan which I have reviewed which is unremarkable.  He has not been take anything for this so suspect this is likely soft tissue injury.  Given mechanism and reassuring exam suspicion for septic arthritis is low at this time.  I will give patient IM Toradol  for this.  His vital signs are stable.  He does not appear to be actively withdrawing from alcohol  and does not appear to be a danger to himself or others.  Think he is stable for discharge.  Risk Prescription drug management.        Final diagnoses:  Left hip pain    ED Discharge Orders          Ordered    diclofenac  Sodium (VOLTAREN ) 1 % GEL  4 times daily        09/03/24 0842    lidocaine  (LIDODERM ) 5 %  Every 24 hours        09/03/24 0842               Ula Prentice SAUNDERS, MD 09/03/24 [phone]

## 2024-09-04 ENCOUNTER — Other Ambulatory Visit (HOSPITAL_COMMUNITY): Payer: Self-pay

## 2024-09-04 MED ORDER — IBUPROFEN 800 MG PO TABS
800.0000 mg | ORAL_TABLET | Freq: Once | ORAL | Status: AC
  Administered 2024-09-04: 800 mg via ORAL
  Filled 2024-09-04: qty 1

## 2024-09-04 MED ORDER — ONDANSETRON 4 MG PO TBDP
4.0000 mg | ORAL_TABLET | Freq: Once | ORAL | Status: AC
  Administered 2024-09-04: 4 mg via ORAL
  Filled 2024-09-04: qty 1

## 2024-09-05 ENCOUNTER — Encounter: Payer: Self-pay | Admitting: Emergency Medicine

## 2024-09-05 ENCOUNTER — Other Ambulatory Visit: Payer: Self-pay

## 2024-09-05 ENCOUNTER — Emergency Department
Admission: EM | Admit: 2024-09-05 | Discharge: 2024-09-05 | Disposition: A | Discharge status: Home / Self Care | Payer: MEDICAID | Attending: Emergency Medicine | Admitting: Emergency Medicine

## 2024-09-05 ENCOUNTER — Emergency Department: Payer: MEDICAID

## 2024-09-05 DIAGNOSIS — W1830XA Fall on same level, unspecified, initial encounter: Secondary | ICD-10-CM | POA: Insufficient documentation

## 2024-09-05 DIAGNOSIS — F10920 Alcohol use, unspecified with intoxication, uncomplicated: Secondary | ICD-10-CM

## 2024-09-05 DIAGNOSIS — Z21 Asymptomatic human immunodeficiency virus [HIV] infection status: Secondary | ICD-10-CM | POA: Insufficient documentation

## 2024-09-05 DIAGNOSIS — F10120 Alcohol abuse with intoxication, uncomplicated: Secondary | ICD-10-CM | POA: Insufficient documentation

## 2024-09-05 DIAGNOSIS — Y908 Blood alcohol level of 240 mg/100 ml or more: Secondary | ICD-10-CM | POA: Insufficient documentation

## 2024-09-05 DIAGNOSIS — M25552 Pain in left hip: Secondary | ICD-10-CM | POA: Insufficient documentation

## 2024-09-05 DIAGNOSIS — G8929 Other chronic pain: Secondary | ICD-10-CM | POA: Insufficient documentation

## 2024-09-05 LAB — COMPREHENSIVE METABOLIC PANEL WITH GFR
ALT: 39 U/L (ref 0–44)
AST: 75 U/L — ABNORMAL HIGH (ref 15–41)
Albumin: 4.3 g/dL (ref 3.5–5.0)
Alkaline Phosphatase: 97 U/L (ref 38–126)
Anion gap: 23 — ABNORMAL HIGH (ref 5–15)
BUN: 13 mg/dL (ref 6–20)
CO2: 18 mmol/L — ABNORMAL LOW (ref 22–32)
Calcium: 8.7 mg/dL — ABNORMAL LOW (ref 8.9–10.3)
Chloride: 100 mmol/L (ref 98–111)
Creatinine, Ser: 0.49 mg/dL — ABNORMAL LOW (ref 0.61–1.24)
GFR, Estimated: >60 mL/min (ref >60)
Glucose, Bld: 74 mg/dL (ref 70–99)
Potassium: 4.3 mmol/L (ref 3.5–5.1)
Sodium: 141 mmol/L (ref 135–145)
Total Bilirubin: 1.2 mg/dL (ref 0.0–1.2)
Total Protein: 8.3 g/dL — ABNORMAL HIGH (ref 6.5–8.1)

## 2024-09-05 LAB — CBC WITH DIFFERENTIAL/PLATELET
Abs Immature Granulocytes: 0 K/uL (ref 0.00–0.07)
Basophils Absolute: 0 K/uL (ref 0.0–0.1)
Basophils Relative: 1 %
Eosinophils Absolute: 0 K/uL (ref 0.0–0.5)
Eosinophils Relative: 0 %
HCT: 40 % (ref 39.0–52.0)
Hemoglobin: 13.3 g/dL (ref 13.0–17.0)
Immature Granulocytes: 0 %
Lymphocytes Relative: 13 %
Lymphs Abs: 0.4 K/uL — ABNORMAL LOW (ref 0.7–4.0)
MCH: 30.7 pg (ref 26.0–34.0)
MCHC: 33.3 g/dL (ref 30.0–36.0)
MCV: 92.4 fL (ref 80.0–100.0)
Monocytes Absolute: 0.1 K/uL (ref 0.1–1.0)
Monocytes Relative: 3 %
Neutro Abs: 2.6 K/uL (ref 1.7–7.7)
Neutrophils Relative %: 83 %
Platelets: 88 K/uL — ABNORMAL LOW (ref 150–400)
RBC: 4.33 MIL/uL (ref 4.22–5.81)
RDW: 19 % — ABNORMAL HIGH (ref 11.5–15.5)
WBC: 3.2 K/uL — ABNORMAL LOW (ref 4.0–10.5)
nRBC: 0 % (ref 0.0–0.2)

## 2024-09-05 LAB — MAGNESIUM: Magnesium: 2 mg/dL (ref 1.7–2.4)

## 2024-09-05 LAB — ETHANOL: Alcohol, Ethyl (B): 326 mg/dL (ref <15)

## 2024-09-05 MED ORDER — CHLORDIAZEPOXIDE HCL 25 MG PO CAPS: 50.0000 mg | ORAL_CAPSULE | Freq: Once | ORAL | Status: DC

## 2024-09-05 NOTE — ED Provider Notes (Signed)
 [facility] Provider Note    Event Date/Time   First MD Initiated Contact with Patient 09/05/24 2030     (approximate)   History   Chief Complaint Alcohol  Problem   HPI  Gary [name] is a 38 y.o. male with past medical history of HIV, alcohol  abuse, bipolar disorder, seizures, and polysubstance abuse who presents to the ED complaining of alcohol  problem.  Patient reports that his last drink was sometime early this morning and he typically drinks at least 1/5 of liquor daily.  He is now concerned that he is developing alcohol  withdrawal symptoms, describes feeling shaky and tremulous with nausea.  He also states that he had a fall today where he hit his left hip, denies hitting his head or losing consciousness.  He denies any drug use.     Physical Exam   Triage Vital Signs: ED Triage Vitals  Encounter Vitals Group     BP 09/05/24 2018 114/82     Girls Systolic BP Percentile --      Girls Diastolic BP Percentile --      Boys Systolic BP Percentile --      Boys Diastolic BP Percentile --      Pulse Rate 09/05/24 2018 (!) 105     Resp 09/05/24 2018 20     Temp 09/05/24 2018 97.7 F (36.5 C)     Temp Source 09/05/24 2018 Oral     SpO2 09/05/24 2013 98 %     Weight 09/05/24 2015 145 lb 8.1 oz (66 kg)     Height 09/05/24 2015 6' (1.829 m)     Head Circumference --      Peak Flow --      Pain Score 09/05/24 2015 6     Pain Loc --      Pain Education --      Exclude from Growth Chart --     Most recent vital signs: Vitals:   09/05/24 2147 09/05/24 2315  BP: 116/76 106/70  Pulse: (!) 102 (!) 105  Resp: 18 20  Temp:    SpO2: 100% 99%    Constitutional: Alert and oriented. Eyes: Conjunctivae are normal. Head: Atraumatic. Nose: No congestion/rhinnorhea. Mouth/Throat: Mucous membranes are moist.  Neck: No midline cervical spine tenderness to palpation. Cardiovascular: Normal rate, regular rhythm. Grossly normal heart sounds.  2+ radial  pulses bilaterally. Respiratory: Normal respiratory effort.  No retractions. Lungs CTAB.  No chest wall tenderness to palpation. Gastrointestinal: Soft and nontender. No distention. Musculoskeletal: Diffuse tenderness to palpation of left hip with no obvious deformity. Neurologic:  Normal speech and language. No gross focal neurologic deficits are appreciated.    ED Results / Procedures / Treatments   Labs (all labs ordered are listed, but only abnormal results are displayed) Labs Reviewed  COMPREHENSIVE METABOLIC PANEL WITH GFR - Abnormal; Notable for the following components:      Result Value   CO2 18 (*)    Creatinine, Ser 0.49 (*)    Calcium  8.7 (*)    Total Protein 8.3 (*)    AST 75 (*)    Anion gap 23 (*)    All other components within normal limits  ETHANOL - Abnormal; Notable for the following components:   Alcohol , Ethyl (B) 326 (*)    All other components within normal limits  CBC WITH DIFFERENTIAL/PLATELET - Abnormal; Notable for the following components:   WBC 3.2 (*)    RDW 19.0 (*)    Platelets 88 (*)  Lymphs Abs 0.4 (*)    All other components within normal limits  MAGNESIUM   CBC WITH DIFFERENTIAL/PLATELET     RADIOLOGY Left hip x-ray reviewed and interpreted by me with no fracture or dislocation.  PROCEDURES:  Critical Care performed: No  Procedures   MEDICATIONS ORDERED IN ED: Medications - No data to display   IMPRESSION / MDM / ASSESSMENT AND PLAN / ED COURSE  I reviewed the triage vital signs and the nursing notes.                              38 y.o. male with past medical history of HIV, seizures, alcohol  abuse, and polysubstance abuse who presents to the ED complaining of alcohol  withdrawal symptoms as well as left hip pain following a fall.  Patient's presentation is most consistent with acute presentation with potential threat to life or bodily function.  Differential diagnosis includes, but is not limited to, fracture,  dislocation, contusion, alcohol  withdrawal, DTs, anemia, electrolyte abnormality, AKI, malingering.  Patient nontoxic-appearing and in no acute distress, vital signs remarkable for mild tachycardia but otherwise reassuring.  While he reports symptoms of alcohol  withdrawal, ethanol level found to be significantly elevated at 326.From a distance, patient does not appear to have any tremulousness and is resting comfortably, begins shaking as I approached for assessment.  Additional labs are also reassuring with mild transaminitis similar to previous and mild anion gap acidosis likely due to alcoholic ketoacidosis.  No acute electrolyte abnormality noted and no significant anemia or leukocytosis noted.  He does have thrombocytopenia similar to previous, no bleeding noted at this time.  X-ray of left hip is unremarkable, no evidence of other traumatic injury.  Patient observed here in the ED and appears clinically sober on reassessment, appropriate for discharge.  He was prescribed Librium  taper for alcohol  withdrawal 4 days ago, does not seem to be interested in quitting drinking at this time.      FINAL CLINICAL IMPRESSION(S) / ED DIAGNOSES   Final diagnoses:  Alcoholic intoxication without complication  Chronic left hip pain     Rx / DC Orders   ED Discharge Orders     None        Note:  This document was prepared using Dragon voice recognition software and may include unintentional dictation errors.   Willo Dunnings, MD 09/05/24 2348

## 2024-09-05 NOTE — ED Triage Notes (Signed)
 Pt arrived via [facility] from side of road in [place] where pt called out for Alcohol  withdrawal. Pt last consumed this AM approx 6-7am. Pt c/o HA, shaking and nausea.

## 2024-09-06 ENCOUNTER — Other Ambulatory Visit: Payer: Self-pay

## 2024-09-06 ENCOUNTER — Encounter: Payer: Self-pay | Admitting: Emergency Medicine

## 2024-09-06 ENCOUNTER — Emergency Department
Admission: EM | Admit: 2024-09-06 | Discharge: 2024-09-06 | Disposition: A | Discharge status: Home / Self Care | Payer: MEDICAID | Attending: Emergency Medicine | Admitting: Emergency Medicine

## 2024-09-06 ENCOUNTER — Other Ambulatory Visit (HOSPITAL_COMMUNITY): Payer: Self-pay

## 2024-09-06 ENCOUNTER — Inpatient Hospital Stay
Admission: EM | Admit: 2024-09-06 | Discharge: 2024-09-11 | DRG: 897 | Disposition: A | Discharge status: Home / Self Care | Payer: MEDICAID | Attending: Family Medicine | Admitting: Family Medicine

## 2024-09-06 DIAGNOSIS — Z91148 Patient's other noncompliance with medication regimen for other reason: Secondary | ICD-10-CM

## 2024-09-06 DIAGNOSIS — F1910 Other psychoactive substance abuse, uncomplicated: Secondary | ICD-10-CM | POA: Diagnosis present

## 2024-09-06 DIAGNOSIS — D696 Thrombocytopenia, unspecified: Secondary | ICD-10-CM | POA: Diagnosis present

## 2024-09-06 DIAGNOSIS — M25552 Pain in left hip: Secondary | ICD-10-CM | POA: Diagnosis present

## 2024-09-06 DIAGNOSIS — F3181 Bipolar II disorder: Secondary | ICD-10-CM | POA: Diagnosis present

## 2024-09-06 DIAGNOSIS — W19XXXA Unspecified fall, initial encounter: Secondary | ICD-10-CM | POA: Diagnosis present

## 2024-09-06 DIAGNOSIS — F418 Other specified anxiety disorders: Secondary | ICD-10-CM | POA: Diagnosis present

## 2024-09-06 DIAGNOSIS — Z59819 Housing instability, housed unspecified: Secondary | ICD-10-CM

## 2024-09-06 DIAGNOSIS — F10939 Alcohol use, unspecified with withdrawal, unspecified: Secondary | ICD-10-CM | POA: Diagnosis present

## 2024-09-06 DIAGNOSIS — R1013 Epigastric pain: Secondary | ICD-10-CM | POA: Insufficient documentation

## 2024-09-06 DIAGNOSIS — Z79899 Other long term (current) drug therapy: Secondary | ICD-10-CM | POA: Diagnosis not present

## 2024-09-06 DIAGNOSIS — K861 Other chronic pancreatitis: Secondary | ICD-10-CM | POA: Diagnosis present

## 2024-09-06 DIAGNOSIS — F10931 Alcohol use, unspecified with withdrawal delirium: Secondary | ICD-10-CM | POA: Diagnosis not present

## 2024-09-06 DIAGNOSIS — F10239 Alcohol dependence with withdrawal, unspecified: Principal | ICD-10-CM | POA: Diagnosis present

## 2024-09-06 DIAGNOSIS — F10220 Alcohol dependence with intoxication, uncomplicated: Secondary | ICD-10-CM | POA: Diagnosis present

## 2024-09-06 DIAGNOSIS — F17210 Nicotine dependence, cigarettes, uncomplicated: Secondary | ICD-10-CM | POA: Diagnosis present

## 2024-09-06 DIAGNOSIS — Z888 Allergy status to other drugs, medicaments and biological substances status: Secondary | ICD-10-CM

## 2024-09-06 DIAGNOSIS — F1010 Alcohol abuse, uncomplicated: Secondary | ICD-10-CM

## 2024-09-06 DIAGNOSIS — F1090 Alcohol use, unspecified, uncomplicated: Secondary | ICD-10-CM | POA: Insufficient documentation

## 2024-09-06 DIAGNOSIS — R636 Underweight: Secondary | ICD-10-CM | POA: Diagnosis present

## 2024-09-06 DIAGNOSIS — Z5900 Homelessness unspecified: Secondary | ICD-10-CM

## 2024-09-06 DIAGNOSIS — Z6372 Alcoholism and drug addiction in family: Secondary | ICD-10-CM

## 2024-09-06 DIAGNOSIS — Z681 Body mass index (BMI) 19 or less, adult: Secondary | ICD-10-CM | POA: Diagnosis not present

## 2024-09-06 DIAGNOSIS — Z21 Asymptomatic human immunodeficiency virus [HIV] infection status: Secondary | ICD-10-CM

## 2024-09-06 DIAGNOSIS — R54 Age-related physical debility: Secondary | ICD-10-CM | POA: Diagnosis present

## 2024-09-06 DIAGNOSIS — F1110 Opioid abuse, uncomplicated: Secondary | ICD-10-CM | POA: Diagnosis present

## 2024-09-06 DIAGNOSIS — R569 Unspecified convulsions: Secondary | ICD-10-CM | POA: Diagnosis not present

## 2024-09-06 DIAGNOSIS — F1020 Alcohol dependence, uncomplicated: Secondary | ICD-10-CM | POA: Diagnosis present

## 2024-09-06 DIAGNOSIS — B20 Human immunodeficiency virus [HIV] disease: Secondary | ICD-10-CM | POA: Diagnosis present

## 2024-09-06 DIAGNOSIS — Z800 Family history of malignant neoplasm of digestive organs: Secondary | ICD-10-CM | POA: Diagnosis not present

## 2024-09-06 DIAGNOSIS — Z8249 Family history of ischemic heart disease and other diseases of the circulatory system: Secondary | ICD-10-CM

## 2024-09-06 DIAGNOSIS — Y909 Presence of alcohol in blood, level not specified: Secondary | ICD-10-CM | POA: Insufficient documentation

## 2024-09-06 DIAGNOSIS — F419 Anxiety disorder, unspecified: Secondary | ICD-10-CM | POA: Diagnosis present

## 2024-09-06 DIAGNOSIS — Y908 Blood alcohol level of 240 mg/100 ml or more: Secondary | ICD-10-CM | POA: Diagnosis present

## 2024-09-06 DIAGNOSIS — K219 Gastro-esophageal reflux disease without esophagitis: Secondary | ICD-10-CM | POA: Diagnosis present

## 2024-09-06 DIAGNOSIS — G8929 Other chronic pain: Secondary | ICD-10-CM | POA: Diagnosis present

## 2024-09-06 DIAGNOSIS — R000 Tachycardia, unspecified: Secondary | ICD-10-CM | POA: Diagnosis present

## 2024-09-06 DIAGNOSIS — Z811 Family history of alcohol abuse and dependence: Secondary | ICD-10-CM | POA: Diagnosis not present

## 2024-09-06 DIAGNOSIS — F319 Bipolar disorder, unspecified: Secondary | ICD-10-CM | POA: Diagnosis not present

## 2024-09-06 DIAGNOSIS — G40509 Epileptic seizures related to external causes, not intractable, without status epilepticus: Secondary | ICD-10-CM | POA: Diagnosis present

## 2024-09-06 DIAGNOSIS — F10932 Alcohol use, unspecified with withdrawal with perceptual disturbance: Secondary | ICD-10-CM | POA: Diagnosis not present

## 2024-09-06 DIAGNOSIS — Z7689 Persons encountering health services in other specified circumstances: Secondary | ICD-10-CM

## 2024-09-06 LAB — COMPREHENSIVE METABOLIC PANEL WITH GFR
ALT: 33 U/L (ref 0–44)
AST: 69 U/L — ABNORMAL HIGH (ref 15–41)
Albumin: 4 g/dL (ref 3.5–5.0)
Alkaline Phosphatase: 76 U/L (ref 38–126)
Anion gap: 21 — ABNORMAL HIGH (ref 5–15)
BUN: 11 mg/dL (ref 6–20)
CO2: 20 mmol/L — ABNORMAL LOW (ref 22–32)
Calcium: 8.6 mg/dL — ABNORMAL LOW (ref 8.9–10.3)
Chloride: 96 mmol/L — ABNORMAL LOW (ref 98–111)
Creatinine, Ser: 0.46 mg/dL — ABNORMAL LOW (ref 0.61–1.24)
GFR, Estimated: >60 mL/min (ref >60)
Glucose, Bld: 68 mg/dL — ABNORMAL LOW (ref 70–99)
Potassium: 4.2 mmol/L (ref 3.5–5.1)
Sodium: 137 mmol/L (ref 135–145)
Total Bilirubin: 0.9 mg/dL (ref 0.0–1.2)
Total Protein: 7.9 g/dL (ref 6.5–8.1)

## 2024-09-06 LAB — CBC WITH DIFFERENTIAL/PLATELET
Abs Immature Granulocytes: 0.01 K/uL (ref 0.00–0.07)
Basophils Absolute: 0.1 K/uL (ref 0.0–0.1)
Basophils Relative: 2 %
Eosinophils Absolute: 0.1 K/uL (ref 0.0–0.5)
Eosinophils Relative: 2 %
HCT: 36 % — ABNORMAL LOW (ref 39.0–52.0)
Hemoglobin: 12.2 g/dL — ABNORMAL LOW (ref 13.0–17.0)
Immature Granulocytes: 0 %
Lymphocytes Relative: 24 %
Lymphs Abs: 0.8 K/uL (ref 0.7–4.0)
MCH: 30.9 pg (ref 26.0–34.0)
MCHC: 33.9 g/dL (ref 30.0–36.0)
MCV: 91.1 fL (ref 80.0–100.0)
Monocytes Absolute: 0.4 K/uL (ref 0.1–1.0)
Monocytes Relative: 13 %
Neutro Abs: 1.9 K/uL (ref 1.7–7.7)
Neutrophils Relative %: 59 %
Platelets: 90 K/uL — ABNORMAL LOW (ref 150–400)
RBC: 3.95 MIL/uL — ABNORMAL LOW (ref 4.22–5.81)
RDW: 19.1 % — ABNORMAL HIGH (ref 11.5–15.5)
WBC: 3.1 K/uL — ABNORMAL LOW (ref 4.0–10.5)
nRBC: 0 % (ref 0.0–0.2)

## 2024-09-06 LAB — URINE DRUG SCREEN, QUALITATIVE (ARMC ONLY)
Amphetamines, Ur Screen: NOT DETECTED
Barbiturates, Ur Screen: POSITIVE — AB
Benzodiazepine, Ur Scrn: POSITIVE — AB
Cannabinoid 50 Ng, Ur ~~LOC~~: NOT DETECTED
Cocaine Metabolite,Ur ~~LOC~~: NOT DETECTED
MDMA (Ecstasy)Ur Screen: NOT DETECTED
Methadone Scn, Ur: NOT DETECTED
Opiate, Ur Screen: NOT DETECTED
Phencyclidine (PCP) Ur S: NOT DETECTED
Tricyclic, Ur Screen: NOT DETECTED

## 2024-09-06 LAB — PROTIME-INR
INR: 1 (ref 0.8–1.2)
Prothrombin Time: 13.4 s (ref 11.4–15.2)

## 2024-09-06 LAB — ETHANOL: Alcohol, Ethyl (B): 66 mg/dL — ABNORMAL HIGH (ref <15)

## 2024-09-06 LAB — CBG MONITORING, ED: Glucose-Capillary: 154 mg/dL — ABNORMAL HIGH (ref 70–99)

## 2024-09-06 MED ORDER — BICTEGRAVIR-EMTRICITAB-TENOFOV 50-200-25 MG PO TABS
1.0000 | ORAL_TABLET | Freq: Every day | ORAL | Status: DC
  Administered 2024-09-06 – 2024-09-11 (×6): 1 via ORAL
  Filled 2024-09-06 (×7): qty 1

## 2024-09-06 MED ORDER — ACETAMINOPHEN 500 MG PO TABS: 500.0000 mg | ORAL_TABLET | Freq: Three times a day (TID) | ORAL | Status: DC | PRN

## 2024-09-06 MED ORDER — LORAZEPAM 2 MG/ML IJ SOLN
1.0000 mg | INTRAMUSCULAR | Status: DC | PRN
  Administered 2024-09-06 – 2024-09-11 (×8): 1 mg via INTRAVENOUS
  Filled 2024-09-06 (×9): qty 1

## 2024-09-06 MED ORDER — EMTRICITABINE-TENOFOVIR AF 200-25 MG PO TABS: 1.0000 | ORAL_TABLET | Freq: Every day | ORAL | Status: DC

## 2024-09-06 MED ORDER — CHLORDIAZEPOXIDE HCL 25 MG PO CAPS
25.0000 mg | ORAL_CAPSULE | Freq: Once | ORAL | Status: AC
  Administered 2024-09-06: 25 mg via ORAL
  Filled 2024-09-06: qty 1

## 2024-09-06 MED ORDER — SODIUM CHLORIDE 0.9 % IV SOLN: INTRAVENOUS | Status: AC

## 2024-09-06 MED ORDER — ONDANSETRON HCL 4 MG PO TABS
4.0000 mg | ORAL_TABLET | Freq: Three times a day (TID) | ORAL | Status: DC | PRN
  Administered 2024-09-06 – 2024-09-10 (×3): 4 mg via ORAL
  Filled 2024-09-06 (×3): qty 1

## 2024-09-06 MED ORDER — ONDANSETRON 4 MG PO TBDP
4.0000 mg | ORAL_TABLET | Freq: Once | ORAL | Status: AC
  Administered 2024-09-06: 4 mg via ORAL
  Filled 2024-09-06: qty 1

## 2024-09-06 MED ORDER — PANCRELIPASE (LIP-PROT-AMYL) 12000-38000 UNITS PO CPEP
36000.0000 [IU] | ORAL_CAPSULE | Freq: Three times a day (TID) | ORAL | Status: DC
  Administered 2024-09-06 – 2024-09-11 (×14): 36000 [IU] via ORAL
  Filled 2024-09-06: qty 3
  Filled 2024-09-06: qty 1
  Filled 2024-09-06 (×2): qty 3
  Filled 2024-09-06: qty 1
  Filled 2024-09-06 (×12): qty 3

## 2024-09-06 MED ORDER — GABAPENTIN 100 MG PO CAPS
100.0000 mg | ORAL_CAPSULE | Freq: Three times a day (TID) | ORAL | Status: DC
  Administered 2024-09-06 – 2024-09-07 (×4): 100 mg via ORAL
  Filled 2024-09-06 (×4): qty 1

## 2024-09-06 MED ORDER — DEXTROSE 50 % IV SOLN: 1.0000 | INTRAVENOUS | Status: DC | PRN

## 2024-09-06 MED ORDER — ACETAMINOPHEN 500 MG PO TABS
1000.0000 mg | ORAL_TABLET | Freq: Once | ORAL | Status: AC
  Administered 2024-09-06: 1000 mg via ORAL
  Filled 2024-09-06: qty 2

## 2024-09-06 MED ORDER — LORAZEPAM 2 MG PO TABS: 0.0000 mg | ORAL_TABLET | Freq: Two times a day (BID) | ORAL | Status: DC

## 2024-09-06 MED ORDER — DOLUTEGRAVIR SODIUM 50 MG PO TABS: 50.0000 mg | ORAL_TABLET | Freq: Two times a day (BID) | ORAL | Status: DC

## 2024-09-06 MED ORDER — THIAMINE HCL 100 MG PO TABS
100.0000 mg | ORAL_TABLET | Freq: Every day | ORAL | Status: DC
Start: 2024-09-07 — End: 2024-09-06

## 2024-09-06 MED ORDER — THIAMINE MONONITRATE 100 MG PO TABS
100.0000 mg | ORAL_TABLET | Freq: Every day | ORAL | Status: DC
  Administered 2024-09-06 – 2024-09-11 (×6): 100 mg via ORAL
  Filled 2024-09-06 (×6): qty 1

## 2024-09-06 MED ORDER — THIAMINE HCL 100 MG PO TABS
100.0000 mg | ORAL_TABLET | Freq: Once | ORAL | Status: AC
  Administered 2024-09-06: 100 mg via ORAL
  Filled 2024-09-06 (×2): qty 1

## 2024-09-06 MED ORDER — THIAMINE HCL 100 MG/ML IJ SOLN
100.0000 mg | Freq: Every day | INTRAMUSCULAR | Status: DC
  Filled 2024-09-06: qty 2

## 2024-09-06 MED ORDER — LORAZEPAM 2 MG PO TABS: 0.0000 mg | ORAL_TABLET | Freq: Four times a day (QID) | ORAL | Status: AC

## 2024-09-06 MED ORDER — DIAZEPAM 5 MG/ML IJ SOLN
10.0000 mg | Freq: Once | INTRAMUSCULAR | Status: AC
  Administered 2024-09-06: 10 mg via INTRAVENOUS
  Filled 2024-09-06: qty 2

## 2024-09-06 MED ORDER — MORPHINE SULFATE (PF) 2 MG/ML IV SOLN
2.0000 mg | INTRAVENOUS | Status: DC | PRN
  Administered 2024-09-06 – 2024-09-09 (×8): 2 mg via INTRAVENOUS
  Filled 2024-09-06 (×8): qty 1

## 2024-09-06 MED ORDER — LORAZEPAM 2 MG/ML IJ SOLN: 0.0000 mg | Freq: Two times a day (BID) | INTRAMUSCULAR | Status: DC

## 2024-09-06 MED ORDER — SODIUM CHLORIDE 0.9 % IV BOLUS
1000.0000 mL | Freq: Once | INTRAVENOUS | Status: AC
  Administered 2024-09-06: 1000 mL via INTRAVENOUS

## 2024-09-06 MED ORDER — PANTOPRAZOLE SODIUM 40 MG PO TBEC
40.0000 mg | DELAYED_RELEASE_TABLET | Freq: Every day | ORAL | Status: DC
  Administered 2024-09-06 – 2024-09-11 (×6): 40 mg via ORAL
  Filled 2024-09-06 (×6): qty 1

## 2024-09-06 MED ORDER — NALTREXONE HCL 50 MG PO TABS
50.0000 mg | ORAL_TABLET | Freq: Every day | ORAL | Status: DC
  Administered 2024-09-06 – 2024-09-11 (×6): 50 mg via ORAL
  Filled 2024-09-06 (×6): qty 1

## 2024-09-06 MED ORDER — LEVETIRACETAM 500 MG PO TABS
500.0000 mg | ORAL_TABLET | Freq: Two times a day (BID) | ORAL | Status: DC
  Administered 2024-09-06 – 2024-09-11 (×10): 500 mg via ORAL
  Filled 2024-09-06 (×10): qty 1

## 2024-09-06 MED ORDER — SULFAMETHOXAZOLE-TRIMETHOPRIM 800-160 MG PO TABS
1.0000 | ORAL_TABLET | ORAL | Status: DC
  Administered 2024-09-06 – 2024-09-11 (×3): 1 via ORAL
  Filled 2024-09-06 (×3): qty 1

## 2024-09-06 MED ORDER — SODIUM CHLORIDE 0.9 % BOLUS PEDS
1000.0000 mL | Freq: Once | INTRAVENOUS | 0 refills | Status: DC
  Filled 2024-09-06: qty 1000, 1d supply, fill #0

## 2024-09-06 MED ORDER — MIDAZOLAM HCL 5 MG/5ML IJ SOLN
4.0000 mg | Freq: Once | INTRAMUSCULAR | Status: AC
  Administered 2024-09-06: 4 mg via INTRAVENOUS
  Filled 2024-09-06: qty 5

## 2024-09-06 MED ORDER — LORAZEPAM 2 MG/ML IJ SOLN
0.0000 mg | Freq: Four times a day (QID) | INTRAMUSCULAR | Status: AC
  Administered 2024-09-06 – 2024-09-08 (×8): 2 mg via INTRAVENOUS
  Filled 2024-09-06 (×8): qty 1

## 2024-09-06 MED ORDER — FOLIC ACID 1 MG PO TABS
1.0000 mg | ORAL_TABLET | Freq: Every day | ORAL | Status: DC
  Administered 2024-09-06 – 2024-09-11 (×6): 1 mg via ORAL
  Filled 2024-09-06 (×6): qty 1

## 2024-09-06 MED ORDER — KETOROLAC TROMETHAMINE 15 MG/ML IJ SOLN
15.0000 mg | Freq: Four times a day (QID) | INTRAMUSCULAR | Status: AC | PRN
  Administered 2024-09-06 – 2024-09-11 (×2): 15 mg via INTRAVENOUS
  Filled 2024-09-06 (×2): qty 1

## 2024-09-06 NOTE — ED Notes (Signed)
 While patient is resting and no one is observing patient no tremors are noted. When patient is interacted with by staff, tremors are present.

## 2024-09-06 NOTE — Assessment & Plan Note (Addendum)
 Patient currently noncompliant with home Biktarvy  dosing Continue prophylactic Bactrim  dosing Restart Biktary CD4 count x 1 Monitor

## 2024-09-06 NOTE — ED Provider Notes (Signed)
 [facility] Provider Note    Event Date/Time   First MD Initiated Contact with Patient 09/06/24 [phone]     (approximate)   History   Tremors   HPI  Gary [name] is a 38 y.o. male with history of schizophrenia, HIV, alcohol  use disorder who presents to the emergency department for the third time in less than 12 hours with concerns for tremors.  He states he thinks he is in alcohol  withdrawal.  He went to [facility] on our suggestion and they thought he was too bad off and sent him back to the ED by EMS.  He denies any new complaints.  Patient states I think I am going to have a seizure soon.  Patient has been seen in the emergency department 38 times in the past 6 months.   History provided by patient.    Past Medical History:  Diagnosis Date   Alcohol  abuse    Alcohol -induced pancreatitis 04/16/2022   Anxiety    Bipolar 2 disorder (HCC)    HIV (human immunodeficiency virus infection) (HCC)    Pancreatitis    PTSD (post-traumatic stress disorder)    Schizophrenia (HCC)    Seizures (HCC)    Subdural hematoma (HCC)     Past Surgical History:  Procedure Laterality Date   BIOPSY  04/19/2022   Procedure: BIOPSY;  Surgeon: Wilhelmenia Aloha Raddle., MD;  Location: [facility] ENDOSCOPY;  Service: Gastroenterology;;   ENTEROSCOPY N/A 04/19/2022   Procedure: ENTEROSCOPY;  Surgeon: Wilhelmenia Aloha Raddle., MD;  Location: [facility] ENDOSCOPY;  Service: Gastroenterology;  Laterality: N/A;   INCISION AND DRAINAGE PERIRECTAL ABSCESS N/A 09/24/2016   Procedure: IRRIGATION AND DEBRIDEMENT PERIRECTAL ABSCESS;  Surgeon: Carlin Pastel, MD;  Location: [facility] ORS;  Service: General;  Laterality: N/A;   none      MEDICATIONS:  Prior to Admission medications   Medication Sig Start Date End Date Taking? Authorizing Provider  bictegravir-emtricitabine -tenofovir  AF (BIKTARVY ) 50-200-25 MG TABS tablet Take 1 tablet by mouth daily. 02/24/24   Von Bellis, MD  chlordiazePOXIDE  (LIBRIUM )  25 MG capsule Day 1 take four tablets every 4-6 hours.  Day 2 take three tablets every 4-6 hours.  Day 3 take two tablets every 4-6 hours.  Day 4 take two tablets every 6-8 hours.  Day 5 take two tablets every 12 hours.  Day 6 take one tablets every 12 hours.  Day 7 stop. 09/01/24   Theadore Ozell HERO, MD  diclofenac  Sodium (VOLTAREN ) 1 % GEL Apply 4 g topically 4 (four) times daily. 09/03/24   Ula Prentice SAUNDERS, MD  dolutegravir  (TIVICAY ) 50 MG tablet Take 1 tablet (50 mg total) by mouth 2 (two) times daily. 08/03/24   Marylu Gee, DO  emtricitabine -tenofovir  (TRUVADA ) 200-300 MG tablet Take 1 tablet by mouth daily. 08/04/24   Marylu Gee, DO  folic acid  (FOLVITE ) 1 MG tablet Take 1 tablet (1 mg total) by mouth daily. 05/27/24   Amin, Sumayya, MD  gabapentin  (NEURONTIN ) 100 MG capsule Take 1 capsule (100 mg total) by mouth at bedtime. Patient taking differently: Take 100 mg by mouth 3 (three) times daily. 06/28/24   Tobie Yetta HERO, MD  levETIRAcetam  (KEPPRA ) 500 MG tablet Take 1 tablet (500 mg total) by mouth 2 (two) times daily. 06/28/24   Tobie Yetta HERO, MD  lidocaine  (LIDODERM ) 5 % Place 1 patch onto the skin daily. Remove & Discard patch within 12 hours or as directed by MD 09/03/24   Ula Prentice SAUNDERS, MD  lipase/protease/amylase (CREON ) [phone]  UNITS CPEP capsule Take 1 capsule (36,000 Units total) by mouth 3 (three) times daily before meals. 05/26/24   Caleen Qualia, MD  naltrexone  (DEPADE) 50 MG tablet Take 1 tablet (50 mg total) by mouth daily. 08/03/24   Marylu Gee, DO  ondansetron  (ZOFRAN ) 4 MG tablet Take 1 tablet (4 mg total) by mouth every 8 (eight) hours as needed for nausea or vomiting. 08/03/24   Marylu Gee, DO  pantoprazole  (PROTONIX ) 40 MG tablet Take 1 tablet (40 mg total) by mouth daily. 06/29/24   Tobie Yetta HERO, MD  PHENobarbital  (LUMINAL) 32.4 MG tablet Take 2 tablets (64.8 mg total) by mouth every 8 (eight) hours for 2 days, THEN 1 tablet (32.4 mg total) every 8 (eight) hours for 2  days. 08/08/24 08/12/24  Laurice Maude BROCKS, MD  sulfamethoxazole -trimethoprim  (BACTRIM  DS) 800-160 MG tablet Take 1 tablet by mouth every Monday, Wednesday, and Friday. 08/04/24   Marylu Gee, DO  thiamine  (VITAMIN B1) 100 MG tablet Take 1 tablet (100 mg total) by mouth daily. 06/28/24   Tobie Yetta HERO, MD  famotidine  (PEPCID ) 20 MG tablet Take 1 tablet (20 mg total) by mouth 2 (two) times daily. Patient not taking: Reported on 06/01/2019 05/14/19 06/02/19  [name], MD    Physical Exam   Triage Vital Signs: ED Triage Vitals  Encounter Vitals Group     BP 09/06/24 0604 117/73     Girls Systolic BP Percentile --      Girls Diastolic BP Percentile --      Boys Systolic BP Percentile --      Boys Diastolic BP Percentile --      Pulse Rate 09/06/24 0604 (!) 115     Resp 09/06/24 0604 20     Temp 09/06/24 0604 98 F (36.7 C)     Temp Source 09/06/24 0604 Oral     SpO2 09/06/24 0604 100 %     Weight 09/06/24 0603 145 lb 8.1 oz (66 kg)     Height 09/06/24 0603 6' (1.829 m)     Head Circumference --      Peak Flow --      Pain Score 09/06/24 0603 0     Pain Loc --      Pain Education --      Exclude from Growth Chart --     Most recent vital signs: Vitals:   09/06/24 0604 09/06/24 0608  BP: 117/73 117/73  Pulse: (!) 115 (!) 115  Resp: 20   Temp: 98 F (36.7 C)   SpO2: 100%     CONSTITUTIONAL: Alert, responds appropriately to questions.  Chronically ill-appearing, appears much older than stated age HEAD: Normocephalic, atraumatic EYES: Conjunctivae clear, pupils appear equal, sclera nonicteric ENT: normal nose; moist mucous membranes NECK: Supple, normal ROM CARD: Regular and tachycardic; S1 and S2 appreciated RESP: Normal chest excursion without splinting or tachypnea; breath sounds clear and equal bilaterally; no wheezes, no rhonchi, no rales, no hypoxia or respiratory distress, speaking full sentences ABD/GI: Non-distended; soft, non-tender, no rebound, no guarding, no  peritoneal signs BACK: The back appears normal EXT: Normal ROM in all joints; no deformity noted, no edema SKIN: Normal color for age and race; warm; no rash on exposed skin NEURO: Moves all extremities equally, normal speech, intention tremors of bilateral upper extremities that stop when no one is looking at him, no asterixis, no facial asymmetry PSYCH: The patient's mood and manner are appropriate.   ED Results / Procedures / Treatments  LABS: (all labs ordered are listed, but only abnormal results are displayed) Labs Reviewed  CBC WITH DIFFERENTIAL/PLATELET - Abnormal; Notable for the following components:      Result Value   WBC 3.1 (*)    RBC 3.95 (*)    Hemoglobin 12.2 (*)    HCT 36.0 (*)    RDW 19.1 (*)    Platelets 90 (*)    All other components within normal limits  COMPREHENSIVE METABOLIC PANEL WITH GFR - Abnormal; Notable for the following components:   Chloride 96 (*)    CO2 20 (*)    Glucose, Bld 68 (*)    Creatinine, Ser 0.46 (*)    Calcium  8.6 (*)    AST 69 (*)    Anion gap 21 (*)    All other components within normal limits  ETHANOL - Abnormal; Notable for the following components:   Alcohol , Ethyl (B) 66 (*)    All other components within normal limits  URINE DRUG SCREEN, QUALITATIVE ([facility] ONLY)  CBG MONITORING, ED     EKG:  EKG Interpretation Date/Time:  Wednesday September 06 2024 06:38:13 EDT Ventricular Rate:  113 PR Interval:  136 QRS Duration:  86 QT Interval:  352 QTC Calculation: 482 R Axis:   62  Text Interpretation: Sinus tachycardia Cannot rule out Anterior infarct , age undetermined Abnormal ECG When compared with ECG of 29-Aug-2024 13:53, PREVIOUS ECG IS PRESENT Confirmed by Neomi Neptune [id number]) on 09/06/2024 6:39:40 AM         RADIOLOGY: My personal review and interpretation of imaging: X-ray from yesterday of the left hip shows no acute abnormality.  I have personally reviewed all radiology reports.   DG Hip Unilat W or Wo  Pelvis 2-3 Views Left Result Date: 09/05/2024 EXAM: 2 or 3 VIEW(S) XRAY OF THE LEFT HIP 09/05/2024 10:22:00 PM COMPARISON: X-ray pelvis 08/29/2024. CLINICAL HISTORY: Fall. The patient states left hip pain due to falling today, yesterday, and the day before. Patient states difficulty straightening leg out, feels less pain when leg is in frogleg position. FINDINGS: BONES AND JOINTS: No acute fracture or focal osseous lesion. The hip joint is maintained. No significant degenerative changes. SOFT TISSUES: The soft tissues are unremarkable. IMPRESSION: 1. No acute fracture or dislocation of the left hip or visualized pelvis. Electronically signed by: Morgane Naveau MD 09/05/2024 10:36 PM EDT RP Workstation: [id number]     PROCEDURES:  Critical Care performed: No     Procedures    IMPRESSION / MDM / ASSESSMENT AND PLAN / ED COURSE  I reviewed the triage vital signs and the nursing notes.    Patient here for alcohol  use disorder.  Frequently uses the emergency department and has documented history of malingering.  The patient is on the cardiac monitor to evaluate for evidence of arrhythmia and/or significant heart rate changes.   DIFFERENTIAL DIAGNOSIS (includes but not limited to):   Alcohol  intoxication, alcohol  withdrawal syndrome, malingering, homelessness, decompensated schizophrenia, anxiety, drug-seeking behavior   Patient's presentation is most consistent with acute presentation with potential threat to life or bodily function.   PLAN: Patient is tachycardic but normotensive.  He is not diaphoretic and denies any new pain including headache or chest pain.  He is tremulous only when someone is standing at the bedside talking to him.  I suspect that there is some component of malingering.  I do not feel that he needs IV or oral benzodiazepines currently and I am worried that he is drug-seeking.  Will repeat  lab work to see where his ethanol level is now and consult psychiatry, TTS  to see if there is anything they can offer this patient given he has been in our ED and the [facility] emergency department now 9 times in one week.  He has very high risk to return and inappropriately use the emergency department.     MEDICATIONS GIVEN IN ED: Medications - No data to display   ED COURSE: 7:23 AM  Hemoglobin of 12.2.  Blood glucose of 68.  Will give him something to eat and drink.  Alcohol  level of 66 but on evaluation patient is lying in bed with his eyes open and his legs crossed and hands across his abdomen without any signs of tremor.  Will continue to monitor for true signs of alcohol  withdrawal prior to starting him on a CIWA protocol.   CONSULTS: TTS and psychiatry consulted for further evaluation and disposition.   OUTSIDE RECORDS REVIEWED: Reviewed last admission note in September 2025.       FINAL CLINICAL IMPRESSION(S) / ED DIAGNOSES   Final diagnoses:  Alcohol  use disorder  Frequent attender of accident and emergency department     Rx / DC Orders   ED Discharge Orders     None        Note:  This document was prepared using Dragon voice recognition software and may include unintentional dictation errors.   Adonia Porada, Josette SAILOR, DO 09/06/24 [phone]

## 2024-09-06 NOTE — Assessment & Plan Note (Signed)
 PPI

## 2024-09-06 NOTE — ED Notes (Signed)
 Pt in 25h with witnessed seizure activity, Dr Viviann to bedside, pt moved to rm 1, suctioned. IV access established and placed on monitor.

## 2024-09-06 NOTE — Assessment & Plan Note (Signed)
 Noted withdrawal seizure in the ER today in setting of acute alcoholic intoxication Recurrent issue Improved/resolved status post IV Ativan  Will continue home Keppra  dosing Check Keppra  level to titrate Seizure precautions Monitor

## 2024-09-06 NOTE — ED Notes (Addendum)
 NURSE LOGAN INFORMED OF BED ASSIGNED

## 2024-09-06 NOTE — Plan of Care (Signed)
  Problem: Clinical Measurements: Goal: Will remain free from infection Outcome: Progressing Goal: Diagnostic test results will improve Outcome: Progressing Goal: Respiratory complications will improve Outcome: Progressing   Problem: Activity: Goal: Risk for activity intolerance will decrease Outcome: Progressing   Problem: Nutrition: Goal: Adequate nutrition will be maintained Outcome: Progressing   Problem: Elimination: Goal: Will not experience complications related to bowel motility Outcome: Progressing Goal: Will not experience complications related to urinary retention Outcome: Progressing   Problem: Safety: Goal: Ability to remain free from injury will improve Outcome: Progressing   Problem: Skin Integrity: Goal: Risk for impaired skin integrity will decrease Outcome: Progressing

## 2024-09-06 NOTE — ED Provider Notes (Signed)
 Procedures     ----------------------------------------- 8:08 AM on 09/06/2024 -----------------------------------------  Patient had tonic-clonic seizure with gaze deviation to the left, tongue biting.  Lasted about 2 minutes, then spontaneously resolved, with postictal phase.  He is tachycardic at 128, diaphoretic.  Will start saline bolus.  Gave IV Versed .  Will start CIWA protocol and plan to admit.  ----------------------------------------- 9:06 AM on 09/06/2024 ----------------------------------------- Case d/w hospitalist.    Viviann Pastor, MD 09/06/24 [phone]

## 2024-09-06 NOTE — Assessment & Plan Note (Signed)
 Platelet count in the 80s Chronic issue-likely multifactorial with contributions of alcohol  abuse as well as HIV Monitor

## 2024-09-06 NOTE — Discharge Instructions (Addendum)
 A prescription for Librium  for alcohol  withdrawal was sent to the [name] by the ED physician on 08/31/2024.  I recommend that you pick up this prescription to help with alcohol  withdrawal symptoms and that you follow-up with [facility] to help with detox, alcohol  use disorder.

## 2024-09-06 NOTE — H&P (Addendum)
 History and Physical    Patient: Gary [name] FMW:[id number] DOB: August 25, 1986 DOA: 09/06/2024 DOS: the patient was seen and examined on 09/06/2024 PCP: Pcp, No  Patient coming from: Home  Chief Complaint:  Chief Complaint  Patient presents with   Tremors   HPI: Gary [name] is a 38 y.o. male with medical history significant of HIV, alcohol  abuse, seizure disorder, chronic pancreatitis, depression and bipolar disorder presenting with alcohol  abuse, withdrawal seizure, thrombocytopenia.  Patient noted to have had multiple ER visits for alcohol  intoxication.  Most recent admission September 2025 for issues including alcohol  withdrawal.  Patient reports drinking roughly 2 bottles of liquor daily.  Is intermittently homeless.  Last drink was roughly 2 days ago.  Patient reports generalized malaise, nausea and vomiting.  Positive mild tremor.  Noted IVC and behavioral assessment during last admission.  No active SI/HI.  No belly pain or diarrhea.  Baseline HIV.  Has not been taking home Biktarvy  for the past week.  Patient states he left it at a friend's house.  Patient states that he is ready to quit drinking.  Noted baseline seizures.  Patient reports compliance with daily Keppra . Presented to the ER afebrile, heart rate into the 100s, BP stable.  Satting well on room air.  White count 3.2, hemoglobin 13.3, platelets 88, creatinine 0.49.  AST 75.  Alcohol  level 326.  Urine drug screen pending.  Patient noted had a witnessed GTC seizure in the ER around 8 AM this morning.  Given IV Versed  and Valium  in the ER. Review of Systems: As mentioned in the history of present illness. All other systems reviewed and are negative. Past Medical History:  Diagnosis Date   Alcohol  abuse    Alcohol -induced pancreatitis 04/16/2022   Anxiety    Bipolar 2 disorder (HCC)    HIV (human immunodeficiency virus infection) (HCC)    Pancreatitis    PTSD (post-traumatic stress disorder)    Schizophrenia (HCC)     Seizures (HCC)    Subdural hematoma (HCC)    Past Surgical History:  Procedure Laterality Date   BIOPSY  04/19/2022   Procedure: BIOPSY;  Surgeon: Wilhelmenia Aloha Raddle., MD;  Location: [facility] ENDOSCOPY;  Service: Gastroenterology;;   ENTEROSCOPY N/A 04/19/2022   Procedure: ENTEROSCOPY;  Surgeon: Wilhelmenia Aloha Raddle., MD;  Location: [facility] ENDOSCOPY;  Service: Gastroenterology;  Laterality: N/A;   INCISION AND DRAINAGE PERIRECTAL ABSCESS N/A 09/24/2016   Procedure: IRRIGATION AND DEBRIDEMENT PERIRECTAL ABSCESS;  Surgeon: Carlin Pastel, MD;  Location: [facility] ORS;  Service: General;  Laterality: N/A;   none     Social History:  reports that he has been smoking cigarettes. He started smoking about 21 years ago. He has a 10.9 pack-year smoking history. He has been exposed to tobacco smoke. He has never used smokeless tobacco. He reports current alcohol  use of about 105.0 standard drinks of alcohol  per week. He reports that he does not currently use drugs after having used the following drugs: Methamphetamines and Cocaine.  Allergies  Allergen Reactions   Risperidone  And Paliperidone Other (See Comments)    Patient states causes opposite effect (he will not take again), and gave him psychotic thoughts   Tegretol  [Carbamazepine ] Other (See Comments)    Vertigo  Paralysis   Caffeine Palpitations    Family History  Problem Relation Age of Onset   Hypertension Mother    Alcohol  abuse Mother    Alcoholism Mother    Alcohol  abuse Father    Colon cancer Other  Other Other    Cancer Other     Prior to Admission medications   Medication Sig Start Date End Date Taking? Authorizing Provider  bictegravir-emtricitabine -tenofovir  AF (BIKTARVY ) 50-200-25 MG TABS tablet Take 1 tablet by mouth daily. 02/24/24  Yes Von Bellis, MD  gabapentin  (NEURONTIN ) 100 MG capsule Take 1 capsule (100 mg total) by mouth at bedtime. Patient taking differently: Take 100 mg by mouth 3 (three) times daily. 06/28/24  Yes  Tobie Yetta HERO, MD  sodium chloride  0.9 % Inject 1,000 mLs into the vein once for 1 dose. 09/06/24 09/06/24 Yes Viviann Pastor, MD  chlordiazePOXIDE  (LIBRIUM ) 25 MG capsule Day 1 take four tablets every 4-6 hours.  Day 2 take three tablets every 4-6 hours.  Day 3 take two tablets every 4-6 hours.  Day 4 take two tablets every 6-8 hours.  Day 5 take two tablets every 12 hours.  Day 6 take one tablets every 12 hours.  Day 7 stop. Patient not taking: Reported on 09/06/2024 09/01/24   Theadore Ozell HERO, MD  diclofenac  Sodium (VOLTAREN ) 1 % GEL Apply 4 g topically 4 (four) times daily. Patient not taking: Reported on 09/06/2024 09/03/24   Ula Prentice SAUNDERS, MD  dolutegravir  (TIVICAY ) 50 MG tablet Take 1 tablet (50 mg total) by mouth 2 (two) times daily. Patient not taking: Reported on 09/06/2024 08/03/24   Marylu Gee, DO  emtricitabine -tenofovir  (TRUVADA ) 200-300 MG tablet Take 1 tablet by mouth daily. Patient not taking: Reported on 09/06/2024 08/04/24   Marylu Gee, DO  folic acid  (FOLVITE ) 1 MG tablet Take 1 tablet (1 mg total) by mouth daily. Patient not taking: Reported on 09/06/2024 05/27/24   Amin, Sumayya, MD  levETIRAcetam  (KEPPRA ) 500 MG tablet Take 1 tablet (500 mg total) by mouth 2 (two) times daily. Patient not taking: Reported on 09/06/2024 06/28/24   Tobie Yetta HERO, MD  lidocaine  (LIDODERM ) 5 % Place 1 patch onto the skin daily. Remove & Discard patch within 12 hours or as directed by MD Patient not taking: Reported on 09/06/2024 09/03/24   Ula Prentice SAUNDERS, MD  lipase/protease/amylase (CREON ) 36000 UNITS CPEP capsule Take 1 capsule (36,000 Units total) by mouth 3 (three) times daily before meals. Patient not taking: Reported on 09/06/2024 05/26/24   Amin, Sumayya, MD  naltrexone  (DEPADE) 50 MG tablet Take 1 tablet (50 mg total) by mouth daily. Patient not taking: Reported on 09/06/2024 08/03/24   Marylu Gee, DO  ondansetron  (ZOFRAN ) 4 MG tablet Take 1 tablet (4 mg total) by mouth every 8  (eight) hours as needed for nausea or vomiting. Patient not taking: Reported on 09/06/2024 08/03/24   Marylu Gee, DO  pantoprazole  (PROTONIX ) 40 MG tablet Take 1 tablet (40 mg total) by mouth daily. Patient not taking: Reported on 09/06/2024 06/29/24   Tobie Yetta HERO, MD  PHENobarbital  (LUMINAL) 32.4 MG tablet Take 2 tablets (64.8 mg total) by mouth every 8 (eight) hours for 2 days, THEN 1 tablet (32.4 mg total) every 8 (eight) hours for 2 days. 08/08/24 08/12/24  Laurice Maude BROCKS, MD  sulfamethoxazole -trimethoprim  (BACTRIM  DS) 800-160 MG tablet Take 1 tablet by mouth every Monday, Wednesday, and Friday. Patient not taking: Reported on 09/06/2024 08/04/24   Marylu Gee, DO  thiamine  (VITAMIN B1) 100 MG tablet Take 1 tablet (100 mg total) by mouth daily. Patient not taking: Reported on 09/06/2024 06/28/24   Tobie Yetta HERO, MD  famotidine  (PEPCID ) 20 MG tablet Take 1 tablet (20 mg total) by mouth 2 (two) times daily. Patient  not taking: Reported on 06/01/2019 05/14/19 06/02/19  [name], MD    Physical Exam: Vitals:   09/06/24 0950 09/06/24 0951 09/06/24 0952 09/06/24 1000  BP:    119/77  Pulse: (!) 118 (!) 115 (!) 114 (!) 114  Resp:    18  Temp:      TempSrc:      SpO2: 100% 100% 100% 100%  Weight:      Height:       Physical Exam Constitutional:      Comments: Underweight  Disshevled appearing    HENT:     Head: Normocephalic and atraumatic.     Nose: Nose normal.     Mouth/Throat:     Mouth: Mucous membranes are moist.  Eyes:     Pupils: Pupils are equal, round, and reactive to light.  Cardiovascular:     Rate and Rhythm: Normal rate and regular rhythm.  Pulmonary:     Effort: Pulmonary effort is normal.  Abdominal:     General: Bowel sounds are normal.  Musculoskeletal:        General: Normal range of motion.  Skin:    General: Skin is dry.  Neurological:     General: No focal deficit present.  Psychiatric:        Mood and Affect: Mood normal.     Data  Reviewed:  There are no new results to review at this time.  Lab Results  Component Value Date   WBC 3.1 (L) 09/06/2024   HGB 12.2 (L) 09/06/2024   HCT 36.0 (L) 09/06/2024   MCV 91.1 09/06/2024   PLT 90 (L) 09/06/2024   Last metabolic panel Lab Results  Component Value Date   GLUCOSE 68 (L) 09/06/2024   NA 137 09/06/2024   K 4.2 09/06/2024   CL 96 (L) 09/06/2024   CO2 20 (L) 09/06/2024   BUN 11 09/06/2024   CREATININE 0.46 (L) 09/06/2024   GFRNONAA >60 09/06/2024   CALCIUM  8.6 (L) 09/06/2024   PHOS 4.6 08/02/2024   PROT 7.9 09/06/2024   ALBUMIN 4.0 09/06/2024   BILITOT 0.9 09/06/2024   ALKPHOS 76 09/06/2024   AST 69 (H) 09/06/2024   ALT 33 09/06/2024   ANIONGAP 21 (H) 09/06/2024    Assessment and Plan: Seizure (HCC) Noted withdrawal seizure in the ER today in setting of acute alcoholic intoxication Recurrent issue Improved/resolved status post IV Ativan  Will continue home Keppra  dosing Check Keppra  level to titrate Seizure precautions Monitor   Alcohol  use disorder, severe, dependence (HCC) Patient reports roughly 2 bottles of liquor intake daily Multiple ER visits for similar with alcohol  level ranging in the 300s to the 400s Alcohol  level in the 300s today will secondary alcohol  withdrawal seizure CIWA protocol as appropriate There is some concern for psychogenic component-behavioral health assessment also pending Monitor  HIV disease (HCC) Patient currently noncompliant with home Biktarvy  dosing Continue prophylactic Bactrim  dosing Restart Biktary CD4 count x 1 Monitor  Depression with anxiety Due to baseline depression in setting of alcohol  abuse No reported active HI/SI though there is a predominant concern for psychogenic component to other comorbidities Behavioral health assessment pending Follow-up recommendations  Polysubstance abuse (HCC) UDS pending  GERD (gastroesophageal reflux disease) PPI  Thrombocytopenia Platelet count in the  80s Chronic issue-likely multifactorial with contributions of alcohol  abuse as well as HIV Monitor      Advance Care Planning:   Code Status: Full Code   Consults: None   Family Communication: No family at the bedside  Severity of Illness: The appropriate patient status for this patient is OBSERVATION. Observation status is judged to be reasonable and necessary in order to provide the required intensity of service to ensure the patient's safety. The patient's presenting symptoms, physical exam findings, and initial radiographic and laboratory data in the context of their medical condition is felt to place them at decreased risk for further clinical deterioration. Furthermore, it is anticipated that the patient will be medically stable for discharge from the hospital within 2 midnights of admission.   Author: Elspeth JINNY Masters, MD 09/06/2024 10:26 AM  For on call review [email or web address].

## 2024-09-06 NOTE — Assessment & Plan Note (Signed)
 Due to baseline depression in setting of alcohol  abuse No reported active HI/SI though there is a predominant concern for psychogenic component to other comorbidities Behavioral health assessment pending Follow-up recommendations

## 2024-09-06 NOTE — Assessment & Plan Note (Signed)
UDS pending  

## 2024-09-06 NOTE — ED Triage Notes (Addendum)
 Pt returned back to ED from [facility] by [facility] for tremors. Per EMS, [facility] reported that they could not assist him due to his tremors. When handed objects during transfer of stretcher to wheelchair, pt stops shaking and able to hold objects without tremors.

## 2024-09-06 NOTE — ED Triage Notes (Signed)
 Pt was d/c at 1115pm tonight for alcohol  intoxication and has been sitting outside the lobby doors; re-enter lobby requesting help with his shakes
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
## 2024-09-06 NOTE — ED Provider Notes (Signed)
 [facility] Provider Note    Event Date/Time   First MD Initiated Contact with Patient 09/06/24 [phone]     (approximate)   History   Alcohol  Problem   HPI  Gary [name] is a 38 y.o. male with history of HIV, schizophrenia, alcohol  use disorder who presents to the emergency department complaining of upper abdominal discomfort similar to his chronic pancreatitis, nausea without vomiting, chronic left hip pain without new injury, concerns for alcohol  withdrawal.  He states he feels shaky and tremulous.  Was seen here earlier this evening and had an alcohol  level greater than 300.  Was discharged and told the ED doctor at that time that he was just going to check back in.  He was told to leave the property by nursing staff and then presents now several hours later stating again that he is in withdrawal.  Patient states he is having tremors but when observed when he is not aware that anyone is watching him his hands are still and he is able to drink out of a cup without any difficulty.   History provided by patient.    Past Medical History:  Diagnosis Date   Alcohol  abuse    Alcohol -induced pancreatitis 04/16/2022   Anxiety    Bipolar 2 disorder (HCC)    HIV (human immunodeficiency virus infection) (HCC)    Pancreatitis    PTSD (post-traumatic stress disorder)    Schizophrenia (HCC)    Seizures (HCC)    Subdural hematoma (HCC)     Past Surgical History:  Procedure Laterality Date   BIOPSY  04/19/2022   Procedure: BIOPSY;  Surgeon: Wilhelmenia Aloha Raddle., MD;  Location: [facility] ENDOSCOPY;  Service: Gastroenterology;;   ENTEROSCOPY N/A 04/19/2022   Procedure: ENTEROSCOPY;  Surgeon: Wilhelmenia Aloha Raddle., MD;  Location: [facility] ENDOSCOPY;  Service: Gastroenterology;  Laterality: N/A;   INCISION AND DRAINAGE PERIRECTAL ABSCESS N/A 09/24/2016   Procedure: IRRIGATION AND DEBRIDEMENT PERIRECTAL ABSCESS;  Surgeon: Carlin Pastel, MD;  Location: [facility] ORS;  Service:  General;  Laterality: N/A;   none      MEDICATIONS:  Prior to Admission medications   Medication Sig Start Date End Date Taking? Authorizing Provider  bictegravir-emtricitabine -tenofovir  AF (BIKTARVY ) 50-200-25 MG TABS tablet Take 1 tablet by mouth daily. 02/24/24   Von Bellis, MD  chlordiazePOXIDE  (LIBRIUM ) 25 MG capsule Day 1 take four tablets every 4-6 hours.  Day 2 take three tablets every 4-6 hours.  Day 3 take two tablets every 4-6 hours.  Day 4 take two tablets every 6-8 hours.  Day 5 take two tablets every 12 hours.  Day 6 take one tablets every 12 hours.  Day 7 stop. 09/01/24   Theadore Ozell HERO, MD  diclofenac  Sodium (VOLTAREN ) 1 % GEL Apply 4 g topically 4 (four) times daily. 09/03/24   Ula Prentice SAUNDERS, MD  dolutegravir  (TIVICAY ) 50 MG tablet Take 1 tablet (50 mg total) by mouth 2 (two) times daily. 08/03/24   Marylu Gee, DO  emtricitabine -tenofovir  (TRUVADA ) 200-300 MG tablet Take 1 tablet by mouth daily. 08/04/24   Marylu Gee, DO  folic acid  (FOLVITE ) 1 MG tablet Take 1 tablet (1 mg total) by mouth daily. 05/27/24   Amin, Sumayya, MD  gabapentin  (NEURONTIN ) 100 MG capsule Take 1 capsule (100 mg total) by mouth at bedtime. Patient taking differently: Take 100 mg by mouth 3 (three) times daily. 06/28/24   Tobie Yetta HERO, MD  levETIRAcetam  (KEPPRA ) 500 MG tablet Take 1 tablet (500 mg total) by  mouth 2 (two) times daily. 06/28/24   Tobie Yetta HERO, MD  lidocaine  (LIDODERM ) 5 % Place 1 patch onto the skin daily. Remove & Discard patch within 12 hours or as directed by MD 09/03/24   Ula Prentice SAUNDERS, MD  lipase/protease/amylase (CREON ) 36000 UNITS CPEP capsule Take 1 capsule (36,000 Units total) by mouth 3 (three) times daily before meals. 05/26/24   Caleen Qualia, MD  naltrexone  (DEPADE) 50 MG tablet Take 1 tablet (50 mg total) by mouth daily. 08/03/24   Marylu Gee, DO  ondansetron  (ZOFRAN ) 4 MG tablet Take 1 tablet (4 mg total) by mouth every 8 (eight) hours as needed for nausea or  vomiting. 08/03/24   Marylu Gee, DO  pantoprazole  (PROTONIX ) 40 MG tablet Take 1 tablet (40 mg total) by mouth daily. 06/29/24   Tobie Yetta HERO, MD  PHENobarbital  (LUMINAL) 32.4 MG tablet Take 2 tablets (64.8 mg total) by mouth every 8 (eight) hours for 2 days, THEN 1 tablet (32.4 mg total) every 8 (eight) hours for 2 days. 08/08/24 08/12/24  Laurice Maude BROCKS, MD  sulfamethoxazole -trimethoprim  (BACTRIM  DS) 800-160 MG tablet Take 1 tablet by mouth every Monday, Wednesday, and Friday. 08/04/24   Marylu Gee, DO  thiamine  (VITAMIN B1) 100 MG tablet Take 1 tablet (100 mg total) by mouth daily. 06/28/24   Tobie Yetta HERO, MD  famotidine  (PEPCID ) 20 MG tablet Take 1 tablet (20 mg total) by mouth 2 (two) times daily. Patient not taking: Reported on 06/01/2019 05/14/19 06/02/19  [name], MD    Physical Exam   Triage Vital Signs: ED Triage Vitals [09/06/24 0318]  Encounter Vitals Group     BP 129/87     Girls Systolic BP Percentile      Girls Diastolic BP Percentile      Boys Systolic BP Percentile      Boys Diastolic BP Percentile      Pulse Rate (!) 107     Resp 18     Temp 97.9 F (36.6 C)     Temp Source Oral     SpO2 100 %     Weight 145 lb 8.1 oz (66 kg)     Height 6' (1.829 m)     Head Circumference      Peak Flow      Pain Score 10     Pain Loc      Pain Education      Exclude from Growth Chart     Most recent vital signs: Vitals:   09/06/24 0318  BP: 129/87  Pulse: (!) 107  Resp: 18  Temp: 97.9 F (36.6 C)  SpO2: 100%    CONSTITUTIONAL: Alert, responds appropriately to questions.  Chronically ill-appearing, appears older than stated age, smells of alcohol  HEAD: Normocephalic, atraumatic EYES: Conjunctivae clear, pupils appear equal, sclera nonicteric ENT: normal nose; moist mucous membranes NECK: Supple, normal ROM CARD: Regular and minimally tachycardic; S1 and S2 appreciated RESP: Normal chest excursion without splinting or tachypnea; breath sounds clear  and equal bilaterally; no wheezes, no rhonchi, no rales, no hypoxia or respiratory distress, speaking full sentences ABD/GI: Non-distended; soft, mild tenderness in epigastric region, no guarding or rebound BACK: The back appears normal EXT: Normal ROM in all joints; no deformity noted, no edema SKIN: Normal color for age and race; warm; no rash on exposed skin NEURO: Moves all extremities equally, normal speech, no asterixis, appears to be intentional tremulousness upper extremities and I am talking to him but when he is observed  drinking out of a cup when no one is staying at his bedside he does not shake at all.   PSYCH: The patient's mood and manner are appropriate.   ED Results / Procedures / Treatments   LABS: (all labs ordered are listed, but only abnormal results are displayed) Labs Reviewed - No data to display   EKG:   RADIOLOGY: My personal review and interpretation of imaging: X-ray of the left hip obtained earlier today shows no acute abnormality.  I have personally reviewed all radiology reports.   DG Hip Unilat W or Wo Pelvis 2-3 Views Left Result Date: 09/05/2024 EXAM: 2 or 3 VIEW(S) XRAY OF THE LEFT HIP 09/05/2024 10:22:00 PM COMPARISON: X-ray pelvis 08/29/2024. CLINICAL HISTORY: Fall. The patient states left hip pain due to falling today, yesterday, and the day before. Patient states difficulty straightening leg out, feels less pain when leg is in frogleg position. FINDINGS: BONES AND JOINTS: No acute fracture or focal osseous lesion. The hip joint is maintained. No significant degenerative changes. SOFT TISSUES: The soft tissues are unremarkable. IMPRESSION: 1. No acute fracture or dislocation of the left hip or visualized pelvis. Electronically signed by: Morgane Naveau MD 09/05/2024 10:36 PM EDT RP Workstation: [id number]     PROCEDURES:  Critical Care performed: No     Procedures    IMPRESSION / MDM / ASSESSMENT AND PLAN / ED COURSE  I reviewed the  triage vital signs and the nursing notes.    Patient here with alcohol  use disorder, malingering stating that he has an alcohol  withdrawal.    DIFFERENTIAL DIAGNOSIS (includes but not limited to):   Malingering, alcohol  use disorder, homelessness, I do not feel that patient is in acute withdrawal, DTs   Patient's presentation is most consistent with acute complicated illness / injury requiring diagnostic workup.   PLAN: Lab work reviewed from earlier this evening where patient had a alcohol  level of greater than 300 about 5 hours ago.  He still smells of alcohol  here.  He has intentional tremors of his upper extremities when I am standing at the bedside but when I am observing him from afar he has no tremors and he is able to drink from a cup without any shaking.  Minimal tenderness in the epigastric region has history of chronic pancreatitis.  Reports nausea without vomiting.  No indication for repeat labs, imaging today.  Will give Tylenol , Zofran .  Will give him one-time dose of Librium  for alcohol  withdrawal as he expresses interest in going to [facility] this morning for detox.  He is asking if behavioral health can help him here.  Discussed with patient that behavioral health does not set up rehab for patients with concomitant psychiatric disorder and that he would need to set this up on his own given my suspicion that he actually wants rehabilitation is quite low.  I suspect that he is here because it is cold outside and he is homeless.  Will provide him with resources.  A prescription for Librium  was called into his local pharmacy by ED physician at [facility] 7 days ago.  Have encouraged him to pick this up.   MEDICATIONS GIVEN IN ED: Medications  ondansetron  (ZOFRAN -ODT) disintegrating tablet 4 mg (has no administration in time range)  acetaminophen  (TYLENOL ) tablet 1,000 mg (has no administration in time range)  thiamine  (VITAMIN B1) tablet 100 mg (has no administration in time range)   chlordiazePOXIDE  (LIBRIUM ) capsule 25 mg (has no administration in time range)     ED  COURSE:  At this time, I do not feel there is any life-threatening condition present. I reviewed all nursing notes, vitals, pertinent previous records.  All lab and urine results, EKGs, imaging ordered have been independently reviewed and interpreted by myself.  I reviewed all available radiology reports from any imaging ordered this visit.  Based on my assessment, I feel the patient is safe to be discharged home without further emergent workup and can continue workup as an outpatient as needed. Discussed all findings, treatment plan as well as usual and customary return precautions.  They verbalize understanding and are comfortable with this plan.  Outpatient follow-up has been provided as needed.  All questions have been answered. none   CONSULTS:  none   OUTSIDE RECORDS REVIEWED: Reviewed recent admissions, psychiatric notes.       FINAL CLINICAL IMPRESSION(S) / ED DIAGNOSES   Final diagnoses:  Alcohol  use disorder     Rx / DC Orders   ED Discharge Orders     None        Note:  This document was prepared using Dragon voice recognition software and may include unintentional dictation errors.   Myers Tutterow, Josette SAILOR, DO 09/06/24 [phone]

## 2024-09-06 NOTE — Assessment & Plan Note (Signed)
 Patient reports roughly 2 bottles of liquor intake daily Multiple ER visits for similar with alcohol  level ranging in the 300s to the 400s Alcohol  level in the 300s today will secondary alcohol  withdrawal seizure CIWA protocol as appropriate There is some concern for psychogenic component-behavioral health assessment also pending Monitor

## 2024-09-07 ENCOUNTER — Other Ambulatory Visit (HOSPITAL_COMMUNITY): Payer: Self-pay

## 2024-09-07 DIAGNOSIS — K219 Gastro-esophageal reflux disease without esophagitis: Secondary | ICD-10-CM

## 2024-09-07 DIAGNOSIS — D696 Thrombocytopenia, unspecified: Secondary | ICD-10-CM | POA: Diagnosis not present

## 2024-09-07 DIAGNOSIS — F10931 Alcohol use, unspecified with withdrawal delirium: Secondary | ICD-10-CM

## 2024-09-07 DIAGNOSIS — B20 Human immunodeficiency virus [HIV] disease: Secondary | ICD-10-CM

## 2024-09-07 DIAGNOSIS — F10239 Alcohol dependence with withdrawal, unspecified: Secondary | ICD-10-CM | POA: Diagnosis not present

## 2024-09-07 DIAGNOSIS — F1910 Other psychoactive substance abuse, uncomplicated: Secondary | ICD-10-CM

## 2024-09-07 DIAGNOSIS — R569 Unspecified convulsions: Secondary | ICD-10-CM

## 2024-09-07 LAB — CBC
HCT: 33.4 % — ABNORMAL LOW (ref 39.0–52.0)
Hemoglobin: 10.8 g/dL — ABNORMAL LOW (ref 13.0–17.0)
MCH: 30.2 pg (ref 26.0–34.0)
MCHC: 32.3 g/dL (ref 30.0–36.0)
MCV: 93.3 fL (ref 80.0–100.0)
Platelets: 63 K/uL — ABNORMAL LOW (ref 150–400)
RBC: 3.58 MIL/uL — ABNORMAL LOW (ref 4.22–5.81)
RDW: 18.6 % — ABNORMAL HIGH (ref 11.5–15.5)
WBC: 3.4 K/uL — ABNORMAL LOW (ref 4.0–10.5)
nRBC: 0 % (ref 0.0–0.2)

## 2024-09-07 LAB — COMPREHENSIVE METABOLIC PANEL WITH GFR
ALT: 27 U/L (ref 0–44)
AST: 54 U/L — ABNORMAL HIGH (ref 15–41)
Albumin: 3.4 g/dL — ABNORMAL LOW (ref 3.5–5.0)
Alkaline Phosphatase: 72 U/L (ref 38–126)
Anion gap: 12 (ref 5–15)
BUN: 6 mg/dL (ref 6–20)
CO2: 23 mmol/L (ref 22–32)
Calcium: 8.8 mg/dL — ABNORMAL LOW (ref 8.9–10.3)
Chloride: 99 mmol/L (ref 98–111)
Creatinine, Ser: 0.45 mg/dL — ABNORMAL LOW (ref 0.61–1.24)
GFR, Estimated: >60 mL/min (ref >60)
Glucose, Bld: 84 mg/dL (ref 70–99)
Potassium: 3.4 mmol/L — ABNORMAL LOW (ref 3.5–5.1)
Sodium: 134 mmol/L — ABNORMAL LOW (ref 135–145)
Total Bilirubin: 1.3 mg/dL — ABNORMAL HIGH (ref 0.0–1.2)
Total Protein: 6.8 g/dL (ref 6.5–8.1)

## 2024-09-07 LAB — T-HELPER CELLS CD4/CD8 %
% CD 4 Pos. Lymph.: 41.2 % (ref 30.8–58.5)
Absolute CD 4 Helper: 165 /uL — ABNORMAL LOW (ref 359–1519)
Basophils Absolute: 0 x10E3/uL (ref 0.0–0.2)
Basos: 1 %
CD3+CD4+ Cells/CD3+CD8+ Cells Bld: 0.9 — ABNORMAL LOW (ref 0.92–3.72)
CD3+CD8+ Cells # Bld: 184 /uL (ref 109–897)
CD3+CD8+ Cells NFr Bld: 45.9 % — ABNORMAL HIGH (ref 12.0–35.5)
EOS (ABSOLUTE): 0 x10E3/uL (ref 0.0–0.4)
Eos: 1 %
Hematocrit: 31.5 % — ABNORMAL LOW (ref 37.5–51.0)
Hemoglobin: 10.4 g/dL — ABNORMAL LOW (ref 13.0–17.7)
Immature Grans (Abs): 0 x10E3/uL (ref 0.0–0.1)
Immature Granulocytes: 0 %
Lymphocytes Absolute: 0.4 x10E3/uL — ABNORMAL LOW (ref 0.7–3.1)
Lymphs: 15 %
MCH: 30.6 pg (ref 26.6–33.0)
MCHC: 33 g/dL (ref 31.5–35.7)
MCV: 93 fL (ref 79–97)
Monocytes Absolute: 0.2 x10E3/uL (ref 0.1–0.9)
Monocytes: 8 %
Neutrophils Absolute: 2.1 x10E3/uL (ref 1.4–7.0)
Neutrophils: 75 %
Platelets: 71 x10E3/uL — ABNORMAL LOW (ref 150–450)
RBC: 3.4 x10E6/uL — ABNORMAL LOW (ref 4.14–5.80)
RDW: 17.9 % — ABNORMAL HIGH (ref 11.6–15.4)
WBC: 2.8 x10E3/uL — ABNORMAL LOW (ref 3.4–10.8)

## 2024-09-07 MED ORDER — HEPARIN SODIUM (PORCINE) 5000 UNIT/ML IJ SOLN
5000.0000 [IU] | Freq: Two times a day (BID) | INTRAMUSCULAR | Status: DC
  Administered 2024-09-11: 5000 [IU] via SUBCUTANEOUS
  Filled 2024-09-07 (×4): qty 1

## 2024-09-07 MED ORDER — GABAPENTIN 100 MG PO CAPS
200.0000 mg | ORAL_CAPSULE | Freq: Three times a day (TID) | ORAL | Status: DC
  Administered 2024-09-07 – 2024-09-11 (×10): 200 mg via ORAL
  Filled 2024-09-07 (×10): qty 2

## 2024-09-07 MED ORDER — LIDOCAINE 5 % EX PTCH
1.0000 | MEDICATED_PATCH | CUTANEOUS | Status: DC
  Filled 2024-09-07 (×5): qty 1

## 2024-09-07 MED ORDER — SODIUM CHLORIDE 0.9% FLUSH
10.0000 mL | Freq: Two times a day (BID) | INTRAVENOUS | Status: DC
  Administered 2024-09-08: 10 mL
  Administered 2024-09-09: 30 mL
  Administered 2024-09-09 – 2024-09-10 (×3): 10 mL
  Administered 2024-09-10: 20 mL

## 2024-09-07 MED ORDER — SODIUM CHLORIDE 0.9% FLUSH: 10.0000 mL | INTRAVENOUS | Status: DC | PRN

## 2024-09-07 MED ORDER — TRAMADOL HCL 50 MG PO TABS
50.0000 mg | ORAL_TABLET | Freq: Four times a day (QID) | ORAL | Status: DC | PRN
  Administered 2024-09-08: 50 mg via ORAL
  Filled 2024-09-07: qty 1

## 2024-09-07 MED ORDER — SODIUM CHLORIDE 0.9 % IV BOLUS
250.0000 mL | Freq: Once | INTRAVENOUS | Status: AC
  Administered 2024-09-08: 250 mL via INTRAVENOUS

## 2024-09-07 NOTE — Plan of Care (Signed)
  Problem: Education: Goal: Knowledge of General Education information will improve Description: Including pain rating scale, medication(s)/side effects and non-pharmacologic comfort measures Outcome: Progressing   Problem: Clinical Measurements: Goal: Ability to maintain clinical measurements within normal limits will improve Outcome: Progressing Goal: Will remain free from infection Outcome: Progressing   Problem: Nutrition: Goal: Adequate nutrition will be maintained Outcome: Progressing   Problem: Safety: Goal: Ability to remain free from injury will improve Outcome: Progressing   Problem: Skin Integrity: Goal: Risk for impaired skin integrity will decrease Outcome: Progressing

## 2024-09-07 NOTE — Consult Note (Signed)
 [facility] Psychiatric Consult Initial  Patient Name: .Gary [name]  MRN: [id number]  DOB: [birth year]  Consult Order details:  Orders (From admission, onward)     Start     Ordered   09/06/24 1653  IP CONSULT TO PSYCHIATRY       Ordering Provider: Eldonna Elspeth PARAS, MD  Provider:  (Not yet assigned)  Question:  Reason for consult:  Answer:  Medication management   09/06/24 1652   09/06/24 1653  IP CONSULT TO PSYCHIATRY       Ordering Provider: Eldonna Elspeth PARAS, MD  Provider:  (Not yet assigned)  Question:  Reason for consult:  Answer:  Medication management   09/06/24 1652             Mode of Visit: In person    Psychiatry Consult Evaluation  Service Date: September 07, 2024 LOS:  LOS: 1 day  Chief Complaint alcohol  withdrawal  Primary Psychiatric Diagnoses  Alcohol  use disorder with withdrawal   Assessment  Gary [name] is a 38 y.o. male admitted: Medically Per hospitalist admission note: Gary [name] is a 38 y.o. male with medical history significant of HIV, alcohol  abuse, seizure disorder, chronic pancreatitis, depression and bipolar disorder presenting with alcohol  abuse, withdrawal seizure, thrombocytopenia.  Patient noted to have had multiple ER visits for alcohol  intoxication.  Most recent admission September 2025 for issues including alcohol  withdrawal.  Patient reports drinking roughly 2 bottles of liquor daily.  Is intermittently homeless.  Last drink was roughly 2 days ago.  Patient reports generalized malaise, nausea and vomiting.  Positive mild tremor.  Noted IVC and behavioral assessment during last admission.  No active SI/HI.  No belly pain or diarrhea.  Baseline HIV.  Has not been taking home Biktarvy  for the past week.  Patient states he left it at a friend's house.  Patient states that he is ready to quit drinking.  Noted baseline seizures.  Patient reports compliance with daily Keppra .Presented to the ER afebrile, heart rate into the 100s, BP stable.   Satting well on room air.  White count 3.2, hemoglobin 13.3, platelets 88, creatinine 0.49.  AST 75.  Alcohol  level 326.  Urine drug screen pending.  Patient noted had a witnessed GTC seizure in the ER around 8 AM this morning.  Given IV Versed  and Valium  in the ER. Psychiatry is consulted for medication management.   On assessment today patient has some residual alcohol  withdrawal symptoms but is oriented x 4.  He reports being able to start eating middle portions.  He denies SI/HI/plan.  He reports hearing a voice outside his head but is unable to describe.  He does have history of alcohol  withdrawal seizures and DTs in the past.  He is declining outpatient mental health and substance use resources.  He reports if he can drink he will continue to drink.  He is requesting only medical detox and discharge.  No indication of inpatient psychiatric admission at this time  Diagnoses:  Active Hospital problems: Principal Problem:   Alcohol  withdrawal seizure (HCC) Active Problems:   HIV disease (HCC)   Polysubstance abuse (HCC)   Alcohol  use disorder, severe, dependence (HCC)   Thrombocytopenia   Seizure (HCC)   Depression with anxiety   GERD (gastroesophageal reflux disease)    Plan   ## Psychiatric Medication Recommendations:  Increase gabapentin  to 200mg  TID  ## Medical Decision Making Capacity: Not specifically addressed in this encounter  ## Further Work-up:   -- most recent  EKG on 09/07/24 had QtC of 482 -- Pertinent labwork reviewed earlier this admission includes: CBC, CMP   ## Disposition:-- There are no psychiatric contraindications to discharge at this time  ## Behavioral / Environmental: -Utilize compassion and acknowledge the patient's experiences while setting clear and realistic expectations for care.    ## Safety and Observation Level:  - Based on my clinical evaluation, I estimate the patient to be at LOW risk of self harm in the current setting. - At this time, we  recommend  routine. This decision is based on my review of the chart including patient's history and current presentation, interview of the patient, mental status examination, and consideration of suicide risk including evaluating suicidal ideation, plan, intent, suicidal or self-harm behaviors, risk factors, and protective factors. This judgment is based on our ability to directly address suicide risk, implement suicide prevention strategies, and develop a safety plan while the patient is in the clinical setting. Please contact our team if there is a concern that risk level has changed.  CSSR Risk Category:C-SSRS RISK CATEGORY: No Risk  Suicide Risk Assessment: Patient has following modifiable risk factors for suicide: substance use which we are addressing by education and offering outpatient substance use resources Patient has following non-modifiable or demographic risk factors for suicide: male gender Patient has the following protective factors against suicide: Supportive family  Thank you for this consult request. Recommendations have been communicated to the primary team.  We will sign off at this time.   Karenna Romanoff, MD       History of Present Illness  Relevant Aspects of [facility]   Patient Report:  On interview patient is noted to be resting in bed.  He is slightly tremulous.  He is oriented to self, location, situation, only events leading up to ED visit, date and current president.  He reports improvement in his appetite.  He reports that he has been drinking 2 bottles of 1/5 every day for almost 10 years and last 2 days he has been having gastric upset unable to drink anymore, stomach ache, not eating for 2 days that prompted the ED visit.  He reports he voluntarily brought himself to the ED visit.  He reports going through withdrawal because he could not drink.  He reports he could be drinking if he can.  He is requesting only for medical detox and discharge.  He expressed  no interest in outpatient substance use programs or referrals.  He denies any current symptoms of depression, denies hopelessness or worthlessness.  Reports poor energy and feeling tired in the context of ossification and not eating for 2 days.  He denies SI/HI/plan and denies visual hallucinations.  He denies nightmares or flashbacks.  He denies any history of abuse or any traumatic experience.  He denies current or recent episodes of mania/hypomania.  Psych ROS:  Depression: denies Anxiety:  intermittent Mania (lifetime and current): denies Psychosis: (lifetime and current): denies      Psychiatric and Social History  Psychiatric History:  Information collected from patient/chart  Prev Dx/Sx: Alcohol  use disorder Current Psych Provider: None reported Home Meds (current): Reported Previous Med Trials: Multiple antipsychotics gave him side effects Therapy: Denies  Prior Psych Hospitalization: Many years ago Prior Self Harm: Denies Prior Violence: Denies  Family Psych History: History of alcohol  use in family members Family Hx suicide: Denies  Social History:   Educational Hx: High school Occupational Hx: Unemployed Legal Hx: Denies Living Situation: Lives with wife and children Spiritual  Hx: Unknown Access to weapons/lethal means: Denies  Substance History Alcohol : On daily basis Type of alcohol  hard liquor Last Drink on the day of ED visit Number of drinks per day 2 bottles of 1/5 History of alcohol  withdrawal seizures yes History of DT's yes Tobacco: Few cigarettes in a day Illicit drugs: Denies Prescription drug abuse: Denies Rehab hx: Denies  Exam Findings  Physical Exam: reviewed and agree with medical exam conducted by the medical provider Vital Signs:  Temp:  [98.1 F (36.7 C)-100.2 F (37.9 C)] 98.4 F (36.9 C) (10/30 1129) Pulse Rate:  [92-110] 98 (10/30 1129) Resp:  [16-20] 18 (10/30 1129) BP: (102-129)/(68-81) 102/68 (10/30 1129) SpO2:  [96  %-98 %] 97 % (10/30 1129) Blood pressure 102/68, pulse 98, temperature 98.4 F (36.9 C), temperature source Oral, resp. rate 18, height 6' (1.829 m), weight 66 kg, SpO2 97%. Body mass index is 19.73 kg/m.    Mental Status Exam: General Appearance: Casual  Orientation:  Full (Time, Place, and Person)  Memory:  Immediate;   Fair Recent;   Fair Remote;   Fair  Concentration:  Concentration: Fair and Attention Span: Fair  Recall:  Fair  Attention  Fair  Eye Contact:  Fair  Speech:  Clear and Coherent  Language:  Fair  Volume:  Normal  Mood: fine  Affect:  Appropriate  Thought Process:  Coherent  Thought Content:  Logical  Suicidal Thoughts:  No  Homicidal Thoughts:  No  Judgement:  Intact  Insight:  Shallow  Psychomotor Activity:  Normal  Akathisia:  No  Fund of Knowledge:  Fair      Assets:  Communication Skills Desire for Improvement  Cognition:  WNL  ADL's:  Intact  AIMS (if indicated):        Other History   These have been pulled in through the EMR, reviewed, and updated if appropriate.  Family History:  The patient's family history includes Alcohol  abuse in his father and mother; Alcoholism in his mother; Cancer in an other family member; Colon cancer in an other family member; Hypertension in his mother; Other in an other family member.  Medical History: Past Medical History:  Diagnosis Date   Alcohol  abuse    Alcohol -induced pancreatitis 04/16/2022   Anxiety    Bipolar 2 disorder (HCC)    HIV (human immunodeficiency virus infection) (HCC)    Pancreatitis    PTSD (post-traumatic stress disorder)    Schizophrenia (HCC)    Seizures (HCC)    Subdural hematoma (HCC)     Surgical History: Past Surgical History:  Procedure Laterality Date   BIOPSY  04/19/2022   Procedure: BIOPSY;  Surgeon: Wilhelmenia Aloha Raddle., MD;  Location: [facility] ENDOSCOPY;  Service: Gastroenterology;;   ENTEROSCOPY N/A 04/19/2022   Procedure: ENTEROSCOPY;  Surgeon: Wilhelmenia Aloha Raddle., MD;  Location: [facility] ENDOSCOPY;  Service: Gastroenterology;  Laterality: N/A;   INCISION AND DRAINAGE PERIRECTAL ABSCESS N/A 09/24/2016   Procedure: IRRIGATION AND DEBRIDEMENT PERIRECTAL ABSCESS;  Surgeon: Carlin Pastel, MD;  Location: [facility] ORS;  Service: General;  Laterality: N/A;   none       Medications:   Current Facility-Administered Medications:    acetaminophen  (TYLENOL ) tablet 500 mg, 500 mg, Oral, Q8H PRN, Eldonna Elspeth PARAS, MD   bictegravir-emtricitabine -tenofovir  AF (BIKTARVY ) 50-200-25 MG per tablet 1 tablet, 1 tablet, Oral, Daily, Viviann Pastor, MD, 1 tablet at 09/07/24 0818   dextrose  50 % solution 50 mL, 1 ampule, Intravenous, PRN, Ward, Kristen N, DO   folic acid  (FOLVITE ) tablet  1 mg, 1 mg, Oral, Daily, Ward, Kristen N, DO, 1 mg at 09/07/24 0818   gabapentin  (NEURONTIN ) capsule 200 mg, 200 mg, Oral, TID, Amalee Olsen, MD   ketorolac  (TORADOL ) 15 MG/ML injection 15 mg, 15 mg, Intravenous, Q6H PRN, Eldonna Elspeth PARAS, MD, 15 mg at 09/06/24 1048   levETIRAcetam  (KEPPRA ) tablet 500 mg, 500 mg, Oral, BID, Ward, Kristen N, DO, 500 mg at 09/07/24 0818   lidocaine  (LIDODERM ) 5 % 1 patch, 1 patch, Transdermal, Q24H, Dezii, Alexandra, DO   lipase/protease/amylase (CREON ) capsule 36,000 Units, 36,000 Units, Oral, TID AC, Ward, Josette SAILOR, DO, 36,000 Units at 09/07/24 1228   LORazepam  (ATIVAN ) injection 0-4 mg, 0-4 mg, Intravenous, Q6H, 2 mg at 09/07/24 0818 **OR** LORazepam  (ATIVAN ) tablet 0-4 mg, 0-4 mg, Oral, Q6H, Viviann Pastor, MD   NOREEN ON 09/08/2024] LORazepam  (ATIVAN ) injection 0-4 mg, 0-4 mg, Intravenous, Q12H **OR** [START ON 09/08/2024] LORazepam  (ATIVAN ) tablet 0-4 mg, 0-4 mg, Oral, Q12H, Viviann Pastor, MD   LORazepam  (ATIVAN ) injection 1 mg, 1 mg, Intravenous, Q2H PRN, Eldonna Elspeth PARAS, MD, 1 mg at 09/06/24 2345   morphine  (PF) 2 MG/ML injection 2 mg, 2 mg, Intravenous, Q4H PRN, Mansy, Jan A, MD, 2 mg at 09/07/24 9377   naltrexone  (DEPADE) tablet 50 mg, 50 mg,  Oral, Daily, Ward, Kristen N, DO, 50 mg at 09/07/24 0818   ondansetron  (ZOFRAN ) tablet 4 mg, 4 mg, Oral, Q8H PRN, Ward, Kristen N, DO, 4 mg at 09/07/24 0818   pantoprazole  (PROTONIX ) EC tablet 40 mg, 40 mg, Oral, Daily, Ward, Kristen N, DO, 40 mg at 09/07/24 0818   sulfamethoxazole -trimethoprim  (BACTRIM  DS) 800-160 MG per tablet 1 tablet, 1 tablet, Oral, Q M,W,F, Ward, Kristen N, DO, 1 tablet at 09/06/24 1133   thiamine  (VITAMIN B1) tablet 100 mg, 100 mg, Oral, Daily, 100 mg at 09/07/24 0818 **OR** thiamine  (VITAMIN B1) injection 100 mg, 100 mg, Intravenous, Daily, Viviann Pastor, MD  Allergies: Allergies  Allergen Reactions   Risperidone  And Paliperidone Other (See Comments)    Patient states causes opposite effect (he will not take again), and gave him psychotic thoughts   Tegretol  [email or web address] ] Other (See Comments)    Vertigo  Paralysis   Caffeine Palpitations    Allyn Foil, MD

## 2024-09-07 NOTE — Plan of Care (Signed)
  Problem: Health Behavior/Discharge Planning: Goal: Ability to manage health-related needs will improve Outcome: Progressing   Problem: Nutrition: Goal: Adequate nutrition will be maintained Outcome: Progressing   Problem: Coping: Goal: Level of anxiety will decrease Outcome: Progressing   Problem: Pain Managment: Goal: General experience of comfort will improve and/or be controlled Outcome: Progressing   Problem: Safety: Goal: Ability to remain free from injury will improve Outcome: Progressing   Problem: Skin Integrity: Goal: Risk for impaired skin integrity will decrease Outcome: Progressing

## 2024-09-07 NOTE — Progress Notes (Signed)
 Patient stating that he is in pain. I informed him that being as his BP dropped from 110/75 to 102/68 that I didn't feel comfortable giving him morphine  I offered tylenol , toradol  and a lidocaine  patch for his back and he refused them all. MD made aware.

## 2024-09-07 NOTE — Discharge Instructions (Addendum)
 Your nurse navigator, Tiffanie, can be reached at [phone]  Jori Baptist, HIV/AIDS Care Manager [phone]  Raven Janet BSN, RN [facility] Nurse Manager - [place] [phone]   Free and [facility]  [facility] [address] [place], [place] [address] [phone]   [facility] [address] Suite [place], [place] [address] [phone]   [facility] [address] Suite [place], [place] [address] Phone #: [phone]   [facility] [address] [place], [place] [address] [phone]  [facility] Bl[facility] [address] [place], [place] [address] [phone]   [facility] [address] [place], [place] [address] Main [phone]   [facility] [address] [place], [place] [address] [phone]  .SABRARent/Utility/Housing  Agency Name: [facility] Agency Address: [address] [place], [place] [address] Phone: [phone] Email: [email or web address] Website: [email or web address] Service(s) Offered: Housing services, self-sufficiency, congregate meal program, weatherization program, field seismologist program, emergency food assistance,  housing counseling, home ownership program, wheels -towork program.  Agency Name: [facility] Address: [address] [place], [place] [address] Phone: [phone] (8a-4p) [phone] (8p- 10p) Email: [email or web address] Website: [email or web address] Service(s) Offered: A program for homeless and/or needy men that includes one-on-one counseling, life skills training and job rehabilitation.  Agency Name: [employer] [facility] [place] Address: [address] [place], [place] [address] Phone: [phone] Website: www.[email or web address].org Service(s) Offered:  Assistance to needy in emergency with utility bills, heating fuel, and prescriptions. Shelter for homeless 7pm-7am. March 04, 2017 15  Agency Name: [facility] [place] (Developmentally Disabled) Address: [address], [place], [place] [address] Phone: [phone] Contact Person: Lemond Cart Email: [email or web address] Website: [email or web address] Service(s) Offered: Helps individuals with developmental disabilities move from housing that is more restrictive to homes where they  can achieve greater independence and have more  opportunities.  Agency Name: [facility] Address: [address], [place], [place] [address] Phone: [phone] Email: [email or web address] Website: www.[email or web address].org Service(s) Offered: Provides affordable housing for low-income families, elderly, and disabled individuals. Offer a wide range of  programs and services, from financial planning to afterschool and summer programs.  Agency Name: Department of Social Services Address: [address] [place], [place] [address] Phone: [phone] Service(s) Offered: Child support services; child welfare services; food stamps; Medicaid; work first family assistance; and aid with fuel,  rent, food and medicine.  Agency Name: [facility] [place] [employer]. Address: [facility] [address] [place], [place]  [address] Phone: [phone] Website: www.familyabuseservices.org Service(s) Offered: 24 hour Crisis Line: [phone]; 24 hour Emergency Shelter; Transitional Housing; Support Groups; Scientist, Physiological; [employer]; Hispanic Outreach: [phone];  Visitation Center: [phone].  Agency Name: [facility] [place]. Address: [address] [place], [place] [address] Phone: [phone] Service(s) Offered: CAP Services; Home and [employer]; Individual or Group Supports; Respite Care Non-Institutional Nursing;  Residential  Supports; Respite Care and Personal Care Services; Transportation; Family and Friends Night; Recreational Activities; Three Nutritious Meals/Snacks; Consultation with Registered Dietician; Twenty-four hour Registered Nurse Access; Daily and [employer]; Camp Green Leaves; Fajardo for the [employer] (During Summer Months) Bingo Night (Every  Wednesday Night); Special Populations Dance Night  (Every Tuesday Night); Professional Hair Care Services.  Agency Name: [facility] Address: [address], [place], [place] [address] Phone: [phone] Contact Person: Meade High Website: [email or web address]  Service(s) Offered: Residential treatment facility for women; food and  clothing, educational & employment development and  transportation to work; corporate investment banker;  parenting and family reunification; emotional and spiritual  support; transitional housing for program graduates.  Agency Name: [facility] Address: [address] [place], [place] [address] Phone: [phone] Email: [email or web address] Website: [email or web address] Service(s) Offered: Public housing units for elderly, disabled, and low income people; housing choice vouchers for income eligible  applicants; shelter plus care vouchers; and Psychologist, Clinical.  Agency Name: [facility] [facility] [facility] [employer] Address: [address], [place], [place] [address] Phone: [phone] Email: [email or web address] Website: www.habitatalamance.org Service(s) Offered: Build houses for families in need of decent housing. Each adult in the family must invest 200 hours of labor on  someone else's house, work with volunteers to build their own house, attend classes on budgeting, home maintenance, yard care, and attend homeowner association meetings.  Agency Name: [facility] Address: [address] [place], [place]  [address] Phone: [phone] Website: www.rsli.org Service(s) Offered: Intermediate care facilities for intellectually delayed, Supervised Living in group homes for adults with developmental disabilities, Supervised Living for people who have dual diagnoses (MRMI), Independent Living, Supported Living, respite and a variety of CAP services, pre-vocational services, day supports, and [employer].  Agency Name: [place] [employer]closure Prevention Fund Phone: [phone] Website: www.NCForeclosurePrevention.gov Service(s) Offered: Zero-interest, deferred loans to homeowners struggling to pay their mortgage. Call for more information.   Food Resources  Agency Name: [facility] Agency Address: [address] [place], [place] [address] Phone: [phone] Website: www.alamanceservices.org Service(s) Offered: Housing services, self-sufficiency, congregate meal program, weatherization program, event organiser program, emergency food assistance,  housing counseling, home ownership program, wheels - to work program.  Dole Food free for 60 and older at various locations from usaa, Monday-Friday:  [employer], [address] [place], [phone] -[facility], [address], [place] [phone]  -[facility], [address], [place] [phone]  [address] [place], [phone]  Agency Name: [facility] Address: [phone] [address], [place], [place] [address] Phone: [phone] Website: www.alamancemow.org Service(s) Offered: Home delivered hot, frozen, and emergency  meals. Grocery assistance program which matches  volunteers one-on-one with seniors unable to grocery shop  for themselves. Must be 60 years and older; less than 20  hours of in-home aide service, limited or no driving ability;  live alone or with someone with a disability; live in  [place].  Agency Name: Ecologist [facility]) Address: [address] [place], [place] [address] Phone: [phone] Service(s) Offered: Food is served to shut-ins, homeless, elderly, and low income people in the community every Saturday (11:30 am-12:30 pm) and Sunday (12:30 pm-1:30pm). Volunteers also offer help and encouragement in seeking employment,  and spiritual guidance.  Agency Name: Department of Social Services Address: [address] [place], [place] [address] Phone: [phone] Service(s) Offered: Child support services; child welfare services; food stamps; Medicaid; work first family assistance; and aid with fuel,  rent, food and medicine.  Agency Name: Dietitian Address: [address], [place], [place] Phone: [phone] Website: www.[email or web address] Services Offered: Monday 10:00am-12:00, 8:00pm-9:00pm, and Friday 10:00am-12:00.  Agency Name: [employer] [facility] [place] Address: [address] [place], [place] [address] Phone: [phone] Website: www.alliedchurches.org Service(s) Offered: Serves weekday meals, open from 11:30 am- 1:00 pm., and 6:30-7:30pm, Monday-Wednesday-Friday distributes food 3:30-6pm, Monday-Wednesday-Friday.  Agency Name: [facility] Address: [address], [place], [place] Phone: [phone] Website: [email or web address].[email or web address].[email or web address] Services Offered: Distributes food the 4th Saturday of the month, starting at 8:00 am  Agency Name: [facility] Address: [phone] [address] [place], [place] [address] Phone: [phone] Website: [email or web address] Service(s) Offered: Bread of life, weekly food pantry. Open Wednesdays from 10:00am-noon.  Agency Name: [facility] [employer] Address: [address] [place], [place], [place] Phone: [phone] Services Offered: Distributes food 9am-1pm, Monday-Thursday. Call for details.  Agency Name:  [facility] Address: [address] [place], [place] [address] Phone: [phone] Website: firstbaptistburlington.[email or web address] Service(s) Offered: Games Developer. Call for assistance.  Agency Name: [facility] Address: [address] [place], [place] [address] Phone: [phone] Service Offered: Emergency Food Pantry. Call for appointment.  Agency Name: [facility] [place] [facility] Address: [address] [place], [place] [address] Phone: [phone] Website: [email or web address] Services Offered: Games Developer. Call for details  Agency Name: [facility] Address: [address] [place], [place] Phone: [phone] Website: [email or web address] Service(s) Offered: Emergency Food Pantry. Call for details.  Agency Name: [facility] Address: [address] [place], [place] [address] Phone: [phone] or [phone] Website: [email or web address].[email or web address] Service(s) Offered: Distribute food 9am-11:30 am, Tuesday-Friday, and 1-3:30pm, Monday-Friday. Food pantry Monday-Friday 1pm-3pm, fresh items, Mon.-Wed.-Fri.  Agency Name: [facility] Address: [address] [place], [place] [address] Phone: [phone] Website: www.safealamance.org Services Offered: Distribute food Tues and Sats from 9:00am-noon. Closed 1st Saturday of each month. Call for details  Agency Name: [employer] [employer] Address: Fayrene Ava [facility] [address], [place] [address] Phone: [phone]  Services Offered: Delivers meals every Thursday     Transportation Resources for [employer] Area  Agency Name: [facility] Agency Address: [address] [place], [place] [address] Phone: [phone] Email: [email or web address] Website: www.[email or web address].org Service(s) Offered: Housing services, self-sufficiency, congregate meal program, weatherization program, field seismologist  program, emergency food assistance,  housing counseling, home ownership program, wheels-towork program.  Agency Name: [facility] [employer] [phone]) Address: [address] [place], [place] [address] Phone: [phone] Website: [email or web address].acta-[email or web address].[email or web address] Service(s) Offered: Transportation for bluelinx, subscription and demand response; Dial-a-Ride for citizens 57 years of age or older.  Agency Name: Department of Social Services Address: [address] [place], [place] [address] Phone: [phone] Service(s) Offered: Child support services; child welfare services; food stamps; Medicaid; work first family assistance; and aid with fuel,  rent, food and medicine, transportation assistance.  Agency Name: Disabled [employer] (DAV) Transportation  Network Phone: [phone] Service(s) Offered: Transports veterans to the [facility] medical center. Call  forty-eight hours in advance and leave the name, telephone  number, date, and time of appointment. Veteran will be  contacted by the driver the day before the appointment to  arrange a pick up point    [employer] ACTA currently provides door to door services. ACTA connects with PART daily for services to [facility]. ACTA also performs contract services to [employer] operates 27 vehicles, all but 3 mini-vans are equipped with lifts for special needs as well as the general public. ACTA drivers are each CDL certified and trained in First Aid and CPR. ACTA was established in 2002 by [employer]. An independent Industrial/product Designer. ACTA operates via cytogeneticist with required local 10% match funding from [place]. ACTA provides over 80,000 passenger trips each year, including [facility] Adult  Day Services and [employer] sites.  Call at least by 11 AM one business day prior to needing  transportation  [employer].                      [place], [place] [address]     Office Hours: Monday-Friday  8 AM - 5 PM   [facility] Primary Care Provider List  [facility] [address] [place], [place] [address] [phone]  [facility] [address], [place], [place] [address] [phone]  [facility] [address] [place], [place], [place] [address] [phone]  [facility] at [facility] [address], [place], [place] [address] [phone]  [facility] Primary Care at [facility] [address] [place], [place], [place] [address] [phone]  [facility] [address] [place], [place], [place] [address] [phone]  [facility] [address], [place], [place] [address] [phone]  [facility] [address], [place], [place] [address] [phone]  [facility] [address], [place], [place] [address] [phone]  [facility] [address], [place], [place] [address] [phone]  [facility] [address] [place], [address], [place], [place] [address] [phone]  [facility] and Adult Medicine [address], [place], [place] [address] [phone]  [facility] at [facility] [address] [place], [place], [place] [address] [phone]  [facility] [address], [place], [place] [address] [phone]  [facility] [address], [place], [place] [address] [phone]   Some PCP options in [place] area- not a comprehensive list  [facility]- [phone] [place]-  [phone] [facility]- [phone] [facility]- [phone] [facility]- [phone] Nichole [phone]  or [facility] Physician Referral Line [phone]   Intensive Outpatient Programs   [facility] [address] [place],  [place], [place] [phone]  [facility] (Inpatient and outpatient)[phone] (Suboxone and Methadone) [address] [phone]  ADS: Ulyess  & [facility] Programs - Intensive Outpatient [address] [place], [place] [address], [place]  [phone]  [facility] (Outpatient, Inpatient, Chemical Caring Services (Groups and Residental) (insurance only) [phone] [place], [place] [phone]   [facility] (for caregivers and family) [address] [place] [address], [place], [place] [phone]  Residential Treatment Programs  [facility] Work Farm(2 years) Residential: 71 days)[facility] ([facility] [address] [place], [place], [place] [phone] or [phone]  [facility] Treatment [facility] [address] [place], [place], [place] [phone]  [facility]idential Treatment Facility[facility]idential Treatment Services (RTS) [address] [place], [place], [place] [phone] Admissions: 8am-3pm M-F  BATS Program: Residential Program [phone] Days)             [facility]: [place]  [facility]  [place], [place], [place]  [phone] or [phone] in Hours over the weekend or by referral)  [facility] [address] [place], [place]  [address] [phone] (Do virtual or phone assessment, offer transportation within 25 miles, have in patient and  Outpatient options)   Mobil Crisis: Therapeutic Alternatives:[phone] (for crisis response 24 hours a day)

## 2024-09-07 NOTE — Progress Notes (Signed)

## 2024-09-07 NOTE — Progress Notes (Signed)
 PROGRESS NOTE    Gary [name]  FMW:[id number] DOB: 04-25-1986 DOA: 09/06/2024 PCP: Pcp, No  Chief Complaint  Patient presents with   [facility] Course:  Gary [name] is a 38 year old male with HIV, alcohol  abuse, opioid use disorder, seizure disorder, chronic pancreatitis, depression, bipolar disorder, well-known to our service due to frequent admissions.  He presents on this admission with alcohol  abuse, withdrawal seizure, and thrombocytopenia.  Presently patient drinks 2 Fifths of vodka each day.  He has been nonadherent to his medications outpatient.  On arrival to the ED heart rate in the 100s, blood pressure stable.  WBC 3.2, alcohol  level 326.  Patient had witnessed GTC seizure in the ER requiring IV Versed  and Valium .  He was then admitted for withdrawal.  Subjective: This morning patient is endorsing back pain.  He is requesting increased opioids.  We have discussed that he will not be receiving increasing amounts of morphine    Objective: Vitals:   09/07/24 0517 09/07/24 0622 09/07/24 0733 09/07/24 1129  BP: 113/74 (P) 113/77 110/75 102/68  Pulse: 100 (P) 92 (!) 103 98  Resp: 20  16 18   Temp: 99 F (37.2 C)  98.1 F (36.7 C) 98.4 F (36.9 C)  TempSrc: Oral  Oral Oral  SpO2: 97%  96% 97%  Weight:      Height:        Intake/Output Summary (Last 24 hours) at 09/07/2024 1432 Last data filed at 09/07/2024 1300 Gross per 24 hour  Intake 787.97 ml  Output 2900 ml  Net -2112.03 ml   Filed Weights   09/06/24 0603  Weight: 66 kg    Examination: General exam: Appears calm and comfortable, chronically ill-appearing, frail Respiratory system: No work of breathing, symmetric chest wall expansion Cardiovascular system: S1 & S2 heard, RRR.  Gastrointestinal system: Abdomen is nondistended, soft and nontender.  Neuro: Rousey, requires prompting.  Speech is weak Extremities: Symmetric, expected ROM  Assessment & Plan:  Principal Problem:   Alcohol   withdrawal seizure (HCC) Active Problems:   Alcohol  use disorder, severe, dependence (HCC)   Seizure (HCC)   HIV disease (HCC)   Depression with anxiety   Polysubstance abuse (HCC)   GERD (gastroesophageal reflux disease)   Thrombocytopenia   Seizure - Withdrawal seizure in the ER on day of arrival in setting of acute alcoholic intoxication.  Patient drinks high-volume EtOH, suspect he was already withdrawing - This is a recurrent issue - Status post Ativan  and Valium  - Continue home dose Keppra  dosing - Keppra  level was ordered though patient has been nonadherent to medications - Continue with seizure precautions  Alcohol  use disorder, severe, dependence Alcohol  withdrawal - Patient currently drinking 2 bottles (fifths) of vodka daily - EtOH on arrival in the 300s - Continue with CIWA protocol - Continue with scheduled Ativan   Depression with anxiety Bipolar disorder - Psych was consulted, does have history of IVC - Patient did meet with psych MD during this admission and denies SI/HI.  Denies need for psychiatric resources for alcohol  use disorder  HIV - Noncompliant with home Biktarvy  - Continue prophylactic Bactrim  - Restart Biktarvy  - CD4 count ordered  Polysubstance abuse Opioid use disorder - Frequent admissions for with opioid medication seeking behaviors - I have discussed with patient that we will not be increasing his opioids and will not be discharging on benzos  GERD - Continue PPI  Thrombocytopenia, chronic - Complicated by HIV and EtOH abuse - Platelets stable at this time.  No active bleeding. - Continue to monitor  Chronic pancreatitis - Continue home dose Creon .  Patient is endorsing vague nausea, but also requesting to order more food.  No pain appreciated on exam.   DVT prophylaxis: Heparin    Code Status: Full Code Disposition: Undergoing alcohol  detox.  Will discharge when off of Ativan   Consultants:    Procedures:     Antimicrobials:  Anti-infectives (From admission, onward)    Start     Dose/Rate Route Frequency Ordered Stop   09/06/24 1000  dolutegravir  (TIVICAY ) tablet 50 mg  Status:  Discontinued        50 mg Oral 2 times daily 09/06/24 0726 09/06/24 0816   09/06/24 1000  emtricitabine -tenofovir  AF (DESCOVY) 200-25 MG per tablet 1 tablet  Status:  Discontinued        1 tablet Oral Daily 09/06/24 0726 09/06/24 0816   09/06/24 1000  sulfamethoxazole -trimethoprim  (BACTRIM  DS) 800-160 MG per tablet 1 tablet        1 tablet Oral Every M-W-F 09/06/24 0726     09/06/24 1000  bictegravir-emtricitabine -tenofovir  AF (BIKTARVY ) 50-200-25 MG per tablet 1 tablet        1 tablet Oral Daily 09/06/24 0817         Data Reviewed: I have personally reviewed following labs and imaging studies CBC: Recent Labs  Lab 09/05/24 2139 09/06/24 0635 09/07/24 0335  WBC 3.2* 3.1* 3.4*  NEUTROABS 2.6 1.9  --   HGB 13.3 12.2* 10.8*  HCT 40.0 36.0* 33.4*  MCV 92.4 91.1 93.3  PLT 88* 90* 63*   Basic Metabolic Panel: Recent Labs  Lab 09/05/24 2038 09/06/24 0635 09/07/24 0335  NA 141 137 134*  K 4.3 4.2 3.4*  CL 100 96* 99  CO2 18* 20* 23  GLUCOSE 74 68* 84  BUN 13 11 6   CREATININE 0.49* 0.46* 0.45*  CALCIUM  8.7* 8.6* 8.8*  MG 2.0  --   --    GFR: Estimated Creatinine Clearance: 118 mL/min (A) (by C-G formula based on SCr of 0.45 mg/dL (L)). Liver Function Tests: Recent Labs  Lab 09/05/24 2038 09/06/24 0635 09/07/24 0335  AST 75* 69* 54*  ALT 39 33 27  ALKPHOS 97 76 72  BILITOT 1.2 0.9 1.3*  PROT 8.3* 7.9 6.8  ALBUMIN 4.3 4.0 3.4*   CBG: Recent Labs  Lab 08/31/24 2323 09/06/24 0806  GLUCAP 167* 154*    No results found for this or any previous visit (from the past 240 hours).   Radiology Studies: DG Hip Unilat W or Wo Pelvis 2-3 Views Left Result Date: 09/05/2024 EXAM: 2 or 3 VIEW(S) XRAY OF THE LEFT HIP 09/05/2024 10:22:00 PM COMPARISON: X-ray pelvis 08/29/2024. CLINICAL HISTORY:  Fall. The patient states left hip pain due to falling today, yesterday, and the day before. Patient states difficulty straightening leg out, feels less pain when leg is in frogleg position. FINDINGS: BONES AND JOINTS: No acute fracture or focal osseous lesion. The hip joint is maintained. No significant degenerative changes. SOFT TISSUES: The soft tissues are unremarkable. IMPRESSION: 1. No acute fracture or dislocation of the left hip or visualized pelvis. Electronically signed by: Morgane Naveau MD 09/05/2024 10:36 PM EDT RP Workstation: [id number]    Scheduled Meds:  bictegravir-emtricitabine -tenofovir  AF  1 tablet Oral Daily   folic acid   1 mg Oral Daily   gabapentin   200 mg Oral TID   levETIRAcetam   500 mg Oral BID   lidocaine   1 patch Transdermal Q24H   lipase/protease/amylase  36,000 Units  Oral TID AC   LORazepam   0-4 mg Intravenous Q6H   Or   LORazepam   0-4 mg Oral Q6H   [START ON 09/08/2024] LORazepam   0-4 mg Intravenous Q12H   Or   [START ON 09/08/2024] LORazepam   0-4 mg Oral Q12H   naltrexone   50 mg Oral Daily   pantoprazole   40 mg Oral Daily   sulfamethoxazole -trimethoprim   1 tablet Oral Q M,W,F   thiamine   100 mg Oral Daily   Or   thiamine   100 mg Intravenous Daily   Continuous Infusions:   LOS: 1 day  MDM: Patient is high risk for one or more organ failure.  They necessitate ongoing hospitalization for continued IV therapies and subsequent lab monitoring. Total time spent interpreting labs and vitals, reviewing the medical record, coordinating care amongst consultants and care team members, directly assessing and discussing care with the patient and/or family: 55 min  Tarrell Debes, DO [facility]  To contact the attending physician between 7A-7P please use Epic Chat. To contact the covering physician during after hours 7P-7A, please review Amion.  09/07/2024, 2:32 PM   *This document has been created with the assistance of dictation software. Please excuse  typographical errors. *

## 2024-09-07 NOTE — Progress Notes (Addendum)
 Patient has been homeless for many years. He rotates between the streets and family. The patient withdrawal seizure in the ER in setting of acute alcoholic intoxication. On CIWA precautions. TOC placed substance abuse, list of free and charitable clinics, and housing resources on the patients AVS.  TOC sent referral to finance to screen for Medicaid and SSI.  No other TOC needs identified. Please outreach if TOC needs arise.   10:50 AM: TOC received notification  This patient has active [place] Medicaid coverage. See Below: Insurance Name: LEMOND Subscriber Name: HAYTHAM, MAHER Member ID: [id number] Subscriber DOB: 1986/06/25 Subscriber Gender: MALE Effective Date(s): 09/07/2024-09/07/2024 Cov Status: Eligible Type of Coverage: [facility] PCP: [facility] MCO: [employer] Plan Description: [facility] [employer] Reference Number: [id number]

## 2024-09-08 DIAGNOSIS — D696 Thrombocytopenia, unspecified: Secondary | ICD-10-CM | POA: Diagnosis not present

## 2024-09-08 DIAGNOSIS — B20 Human immunodeficiency virus [HIV] disease: Secondary | ICD-10-CM | POA: Insufficient documentation

## 2024-09-08 DIAGNOSIS — R569 Unspecified convulsions: Secondary | ICD-10-CM | POA: Diagnosis not present

## 2024-09-08 DIAGNOSIS — F10931 Alcohol use, unspecified with withdrawal delirium: Secondary | ICD-10-CM | POA: Diagnosis not present

## 2024-09-08 LAB — LEVETIRACETAM LEVEL: Levetiracetam Lvl: <2 ug/mL — ABNORMAL LOW (ref 10.0–40.0)

## 2024-09-08 MED ORDER — SODIUM CHLORIDE 0.9 % IV BOLUS
250.0000 mL | Freq: Once | INTRAVENOUS | Status: AC
  Administered 2024-09-08: 250 mL via INTRAVENOUS

## 2024-09-08 MED ORDER — LORAZEPAM 2 MG/ML IJ SOLN
2.0000 mg | Freq: Four times a day (QID) | INTRAMUSCULAR | Status: DC
  Administered 2024-09-08 – 2024-09-09 (×7): 2 mg via INTRAVENOUS
  Filled 2024-09-08 (×6): qty 1

## 2024-09-08 NOTE — Evaluation (Signed)
 Physical Therapy Evaluation Patient Details Name: Gary [name] MRN: [id number] DOB: 04-07-1986 Today's Date: 09/08/2024  History of Present Illness  38 y/o male presented to ED on 09/06/24 for ETOH withdrawal. Witnessed seizure activity in ED. Admitted for alcohol  withdrawal seizure. PMH includes:  seizure disorder, HIV, polysubstance abuse, schizophrenis, PTSD, SDH, alcohol  induced pancreatitis, bipolar disorder  Clinical Impression  Patient admitted with the above. PTA, patient is homeless and reports he uses RW for mobility as he has frequent falls. Agitated and irritated with presence of PT/OT. Completed bed mobility modI with increased time. Stood from EOB with RW and supervision. Ambulated 34' with RW and supervision with noted tremors. No LOB noted. Patient stating I never want to see your face again this admission and any other admission. PT to sign off. Patient is notably unsteady, however he does not want PT while admitted. PT will sign off.         If plan is discharge home, recommend the following: Assist for transportation   Can travel by private vehicle        Equipment Recommendations None recommended by PT  Recommendations for Other Services       Functional Status Assessment Patient has not had a recent decline in their functional status     Precautions / Restrictions Precautions Precautions: Fall Recall of Precautions/Restrictions: Intact Restrictions Weight Bearing Restrictions Per Provider Order: No      Mobility  Bed Mobility Overal bed mobility: Modified Independent             General bed mobility comments: increased time to complete    Transfers Overall transfer level: Needs assistance Equipment used: Rolling Foy Mungia (2 wheels) Transfers: Sit to/from Stand Sit to Stand: Supervision                Ambulation/Gait Ambulation/Gait assistance: Supervision Gait Distance (Feet): 40 Feet Assistive device: Rolling Nosson Wender (2  wheels) Gait Pattern/deviations: Step-through pattern, Decreased stride length Gait velocity: decreased     General Gait Details: very slow gait speed. tremulous throughout. No LOB noted  Stairs            Wheelchair Mobility     Tilt Bed    Modified Rankin (Stroke Patients Only)       Balance Overall balance assessment: History of Falls, Needs assistance Sitting-balance support: No upper extremity supported, Feet supported Sitting balance-Leahy Scale: Normal     Standing balance support: Bilateral upper extremity supported, Reliant on assistive device for balance Standing balance-Leahy Scale: Fair                               Pertinent Vitals/Pain Pain Assessment Pain Assessment: Faces Faces Pain Scale: Hurts a little bit Pain Location: R knee Pain Descriptors / Indicators: Discomfort Pain Intervention(s): Monitored during session    Home Living Family/patient expects to be discharged to:: Shelter/Homeless                        Prior Function Prior Level of Function : Independent/Modified Independent;History of Falls (last six months)             Mobility Comments: reports using RW due to R knee pain and falls       Extremity/Trunk Assessment   Upper Extremity Assessment Upper Extremity Assessment: Defer to OT evaluation    Lower Extremity Assessment Lower Extremity Assessment: Overall WFL for tasks assessed    Cervical /  Trunk Assessment Cervical / Trunk Assessment: Normal  Communication   Communication Communication: No apparent difficulties    Cognition Arousal: Alert Behavior During Therapy: Agitated   PT - Cognitive impairments: No apparent impairments                         Following commands: Intact       Cueing Cueing Techniques: Verbal cues     General Comments      Exercises     Assessment/Plan    PT Assessment Patient does not need any further PT services (per patient demand)   PT Problem List         PT Treatment Interventions      PT Goals (Current goals can be found in the Care Plan section)  Acute Rehab PT Goals Patient Stated Goal: to never see your face again PT Goal Formulation: All assessment and education complete, DC therapy    Frequency       Co-evaluation               AM-PAC PT 6 Clicks Mobility  Outcome Measure Help needed turning from your back to your side while in a flat bed without using bedrails?: None Help needed moving from lying on your back to sitting on the side of a flat bed without using bedrails?: None Help needed moving to and from a bed to a chair (including a wheelchair)?: A Little Help needed standing up from a chair using your arms (e.g., wheelchair or bedside chair)?: A Little Help needed to walk in hospital room?: A Little Help needed climbing 3-5 steps with a railing? : A Little 6 Click Score: 20    End of Session   Activity Tolerance: Treatment limited secondary to agitation;Patient tolerated treatment well Patient left: in bed;with call bell/phone within reach;with bed alarm set Nurse Communication: Mobility status PT Visit Diagnosis: Muscle weakness (generalized) (M62.81);Unsteadiness on feet (R26.81);History of falling (Z91.81);Difficulty in walking, not elsewhere classified (R26.2)    Time: 1100-1114 PT Time Calculation (min) (ACUTE ONLY): 14 min   Charges:   PT Evaluation $PT Eval Low Complexity: 1 Low   PT General Charges $$ ACUTE PT VISIT: 1 Visit         Maryanne Finder, PT, DPT Physical Therapist - [facility]  [facility]  Chayce Rullo A [place] 09/08/2024, 12:35 PM

## 2024-09-08 NOTE — Plan of Care (Signed)
  Problem: Education: Goal: Knowledge of General Education information will improve Description: Including pain rating scale, medication(s)/side effects and non-pharmacologic comfort measures Outcome: Progressing   Problem: Health Behavior/Discharge Planning: Goal: Ability to manage health-related needs will improve Outcome: Progressing   Problem: Clinical Measurements: Goal: Ability to maintain clinical measurements within normal limits will improve Outcome: Progressing Goal: Will remain free from infection Outcome: Progressing Goal: Diagnostic test results will improve Outcome: Progressing Goal: Respiratory complications will improve Outcome: Progressing Goal: Cardiovascular complication will be avoided Outcome: Progressing   Problem: Nutrition: Goal: Adequate nutrition will be maintained Outcome: Progressing   Problem: Coping: Goal: Level of anxiety will decrease Outcome: Progressing   Problem: Elimination: Goal: Will not experience complications related to bowel motility Outcome: Progressing Goal: Will not experience complications related to urinary retention Outcome: Progressing   Problem: Activity: Goal: Risk for activity intolerance will decrease Outcome: Progressing

## 2024-09-08 NOTE — Evaluation (Signed)
 Occupational Therapy Evaluation Patient Details Name: Gary [name] MRN: [id number] DOB: February 15, 1986 Today's Date: 09/08/2024   History of Present Illness   38 y/o male presented to ED on 09/06/24 for ETOH withdrawal. Witnessed seizure activity in ED. Admitted for alcohol  withdrawal seizure. PMH includes:  seizure disorder, HIV, polysubstance abuse, schizophrenis, PTSD, SDH, alcohol  induced pancreatitis, bipolar disorder     Clinical Impressions Pt seen for co-evaluation with PT. Pt reports being homeless but reports use of RW for mobility and is Ind in self care tasks at baseline. Pt also endorses multiple falls. Pt performing bed mobility without assistance. Pt performs bed mobility without assistance and stands with additional hospital gown donned to cover buttocks. Pt ambulates in hallway with use of RW with supervision and no LOB 40'. Pt returning back to room in same manner as above. Pt is becoming increasingly agitated by therapist presence and lack of additional pain medication being provided for him to mobilize. Pt stating,  I never want to see your face again this admission and any other admission. Pt appears to be close to baseline and does not want to work with therapy services. Therapy to sign off at this time.       Functional Status Assessment   Patient has not had a recent decline in their functional status     Equipment Recommendations   None recommended by OT      Precautions/Restrictions   Precautions Precautions: Fall Recall of Precautions/Restrictions: Intact Restrictions Weight Bearing Restrictions Per Provider Order: No     Mobility Bed Mobility Overal bed mobility: Modified Independent                  Transfers Overall transfer level: Needs assistance Equipment used: Rolling walker (2 wheels) Transfers: Sit to/from Stand Sit to Stand: Supervision                  Balance Overall balance assessment: History of Falls, Needs  assistance Sitting-balance support: No upper extremity supported, Feet supported Sitting balance-Leahy Scale: Normal     Standing balance support: Bilateral upper extremity supported, Reliant on assistive device for balance Standing balance-Leahy Scale: Fair                             ADL either performed or assessed with clinical judgement   ADL                                         General ADL Comments: Pt independently uses urinal and simulated toilet transfer with RW and supervision     Vision Patient Visual Report: No change from baseline              Pertinent Vitals/Pain Pain Assessment Pain Assessment: Faces Faces Pain Scale: Hurts a little bit Pain Location: R knee Pain Descriptors / Indicators: Discomfort Pain Intervention(s): Monitored during session     Extremity/Trunk Assessment Upper Extremity Assessment Upper Extremity Assessment: Overall WFL for tasks assessed   Lower Extremity Assessment Lower Extremity Assessment: Overall WFL for tasks assessed   Cervical / Trunk Assessment Cervical / Trunk Assessment: Normal   Communication Communication Communication: No apparent difficulties   Cognition Arousal: Alert Behavior During Therapy: Agitated, Flat affect Cognition: No apparent impairments  Following commands: Intact       Cueing  General Comments   Cueing Techniques: Verbal cues              Home Living Family/patient expects to be discharged to:: Shelter/Homeless                                        Prior Functioning/Environment Prior Level of Function : Independent/Modified Independent;History of Falls (last six months)             Mobility Comments: reports using RW due to R knee pain and falls ADLs Comments: Pt reports being Ind with ADLs and lives on the street            OT Goals(Current goals can be found in the care  plan section)   Acute Rehab OT Goals Patient Stated Goal: to walk to mile up the street and get a beer OT Goal Formulation: With patient Time For Goal Achievement: 09/08/24 Potential to Achieve Goals: Fair   AM-PAC OT 6 Clicks Daily Activity     Outcome Measure Help from another person eating meals?: None Help from another person taking care of personal grooming?: None Help from another person toileting, which includes using toliet, bedpan, or urinal?: None Help from another person bathing (including washing, rinsing, drying)?: None Help from another person to put on and taking off regular upper body clothing?: None Help from another person to put on and taking off regular lower body clothing?: None 6 Click Score: 24   End of Session Equipment Utilized During Treatment: Rolling walker (2 wheels) Nurse Communication: Mobility status  Activity Tolerance: Patient tolerated treatment well Patient left: in bed;with call bell/phone within reach;with bed alarm set                   Time: 8897-8884 OT Time Calculation (min): 13 min Charges:  OT General Charges $OT Visit: 1 Visit OT Evaluation $OT Eval Low Complexity: 1 Low Izetta Claude, MS, OTR/L , CBIS ascom [phone]  09/08/24, 2:47 PM

## 2024-09-08 NOTE — Progress Notes (Signed)
 Patient refused his heparin  injection do to not wanting any shots. Dr Lawence notified by secure chat.

## 2024-09-08 NOTE — TOC Progression Note (Signed)
 Transition of Care [facility]) - Progression Note    Patient Details  Name: Gary [name] MRN: [id number] Date of Birth: 09/29/1986  [facility] CM/SW Contact  Dalia GORMAN Fuse, RN Phone Number: 09/08/2024, 10:13 AM  Clinical Narrative:      Patient remains inpt on CIWA and seizure precautions. No therapy recs available at this time.                    Expected Discharge Plan and Services                                               Social Drivers of Health (SDOH) Interventions SDOH Screenings   Food Insecurity: Food Insecurity Present (09/06/2024)  Housing: High Risk (09/06/2024)  Transportation Needs: Unmet Transportation Needs (09/06/2024)  Utilities: Not At Risk (09/06/2024)  Alcohol  Screen: High Risk (04/17/2024)  Depression (PHQ2-9): Medium Risk (01/28/2024)  Financial Resource Strain: Patient Declined (10/11/2023)  Social Connections: Patient Declined (04/18/2024)  Recent Concern: Social Connections - Socially Isolated (02/03/2024)  Stress: No Stress Concern Present (06/30/2022)   Received from [facility]  Recent Concern: Stress - Stress Concern Present (06/25/2022)   Received from [facility]  Tobacco Use: High Risk (09/06/2024)    Readmission Risk Interventions    05/26/2024   11:12 AM 02/14/2024    3:10 PM 12/15/2023   11:23 AM  Readmission Risk Prevention Plan  Transportation Screening Complete Complete Complete  Medication Review Oceanographer) Complete Complete Complete  PCP or Specialist appointment within 3-5 days of discharge Patient refused Patient refused Complete  HRI or Home Care Consult -- Complete Complete  SW Recovery Care/Counseling Consult  Complete Complete  Palliative Care Screening Not Applicable Not Applicable Not Applicable  Skilled Nursing Facility Not Applicable Not Applicable Not Applicable

## 2024-09-08 NOTE — Progress Notes (Signed)
 PROGRESS NOTE    Gary [name]  FMW:[id number] DOB: 1986-11-06 DOA: 09/06/2024 PCP: Pcp, No  Chief Complaint  Patient presents with   [facility] Course:  Gary [name] is a 38 year old male with HIV, alcohol  abuse, opioid use disorder, seizure disorder, chronic pancreatitis, depression, bipolar disorder, well-known to our service due to frequent admissions.  He presents on this admission with alcohol  abuse, withdrawal seizure, and thrombocytopenia.  Presently patient drinks 2 Fifths of vodka each day.  He has been nonadherent to his medications outpatient.  On arrival to the ED heart rate in the 100s, blood pressure stable.  WBC 3.2, alcohol  level 326.  Patient had witnessed GTC seizure in the ER requiring IV Versed  and Valium .  He was then admitted for withdrawal.  Subjective: Some low blood pressures overnight requiring IV boluses.  Blood pressure still soft but stable this morning. Bedside RN reporting patient has been verbally aggressive. At the time my evaluation patient is calm.  He is requesting additional Ativan  and morphine .  We discussed care plan.  I discussed patient's AIDS diagnosis.  I had patient repeat back to me all information that I discussed with him.  He was able to do so.  Objective: Vitals:   09/07/24 1949 09/07/24 2244 09/08/24 0124 09/08/24 0405  BP: 102/70 101/69 103/71 94/67  Pulse: 93 (!) 105 84 81  Resp: 16 16 16 18   Temp: 98.9 F (37.2 C)  97.9 F (36.6 C) 98 F (36.7 C)  TempSrc: Oral   Oral  SpO2: 99% 99% 97% 97%  Weight:      Height:        Intake/Output Summary (Last 24 hours) at 09/08/2024 0733 Last data filed at 09/07/2024 1949 Gross per 24 hour  Intake 360 ml  Output 1375 ml  Net -1015 ml   Filed Weights   09/06/24 0603  Weight: 66 kg    Examination: General exam: Appears calm and comfortable, chronically ill-appearing, frail Respiratory system: No work of breathing, symmetric chest wall expansion Cardiovascular  system: S1 & S2 heard, RRR.  Gastrointestinal system: Abdomen is nondistended, soft and nontender.  Neuro: Alert, oriented Extremities: Symmetric, expected ROM  Assessment & Plan:  Principal Problem:   Alcohol  withdrawal seizure (HCC) Active Problems:   Alcohol  use disorder, severe, dependence (HCC)   Seizure (HCC)   HIV disease (HCC)   Depression with anxiety   Polysubstance abuse (HCC)   GERD (gastroesophageal reflux disease)   Thrombocytopenia   Seizure - Withdrawal seizure in the ER on day of arrival in setting of acute alcoholic intoxication.  Patient drinks high-volume EtOH, suspect he was already withdrawing - This is a recurrent issue - Status post Ativan  and Valium  - Continue home dose Keppra  dosing - Keppra  level was ordered though patient has been nonadherent to medications - Continue with seizure precautions  Alcohol  use disorder, severe, dependence Alcohol  withdrawal - Patient currently drinking 2 bottles (fifths) of vodka daily - EtOH on arrival in the 300s - Continue with CIWA protocol - Continue with scheduled Ativan , anticipate this will be prolonged taper - Additional Ativan  as needed - Consider phenobarb if needed - Patient has been counseled extensively on the importance of abstinence from alcohol .  He has no desire to quit at this time - Continue folic acid  and thiamine  supplementation.  Will need multivitamin at DC - Is on naltrexone  at home though he is not taking it.  Has been continued here.  Depression with anxiety Bipolar disorder -  Psych was consulted, does have history of IVC - Patient did meet with psych MD during this admission and denies SI/HI.  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  CD4 count has been gradually declining, 598 in January 2025.  182 in September.CD4 165 now. - Patient has been nonadherent to his medications at home - Will continue with  prophylactic Bactrim  at this time - Does not appear to have active infection - Restart Biktarvy  - Will ensure he has infectious disease follow-up at discharge.  HIV/AIDS liaison working with the patient to ensure follow-up - Extensively discussed AIDS diagnosis with patient today.  Polysubstance abuse Opioid use disorder - Frequent admissions for with opioid medication seeking behaviors - I have discussed with patient that we will not be increasing his opioids and will not be discharging on benzos - Continue multimodal pain regimen with lidocaine  patch, gabapentin , Toradol , Tylenol .  Currently on morphine  every 4 hours, will begin de-escalation tomorrow - Has met with psychiatry and TOC and is refusing outpatient resources.  GERD - Continue PPI  Thrombocytopenia, chronic - Complicated by HIV and EtOH abuse - Platelets stable at this time.  No active bleeding. - Continue to monitor  Chronic pancreatitis - Continue home dose Creon .  Patient is endorsing vague nausea, but also requesting to order more food.  No pain appreciated on exam. - Scheduled gabapentin  - Lidocaine  patch for back pain  Unstable housing Frequent readmissions -Nurse navigator for underserved populations has been consulted and is working with patient  Aggressive behaviors - Patient has been advised that verbal and physical aggression will not be tolerated.  We have extensively discussed he will not be receiving increased doses of morphine  as there is no indication for this. - Patient was also reminded that he is not currently under IVC and if he is dissatisfied with his care plan he may leave the hospital AMA  DVT prophylaxis: Heparin    Code Status: Full Code Disposition: Undergoing alcohol  detox.  Will discharge when off of Ativan   Consultants:    Procedures:    Antimicrobials:  Anti-infectives (From admission, onward)    Start     Dose/Rate Route Frequency Ordered Stop   09/06/24 1000  dolutegravir   (TIVICAY ) tablet 50 mg  Status:  Discontinued        50 mg Oral 2 times daily 09/06/24 0726 09/06/24 0816   09/06/24 1000  emtricitabine -tenofovir  AF (DESCOVY) 200-25 MG per tablet 1 tablet  Status:  Discontinued        1 tablet Oral Daily 09/06/24 0726 09/06/24 0816   09/06/24 1000  sulfamethoxazole -trimethoprim  (BACTRIM  DS) 800-160 MG per tablet 1 tablet        1 tablet Oral Every M-W-F 09/06/24 0726     09/06/24 1000  bictegravir-emtricitabine -tenofovir  AF (BIKTARVY ) 50-200-25 MG per tablet 1 tablet        1 tablet Oral Daily 09/06/24 0817         Data Reviewed: I have personally reviewed following labs and imaging studies CBC: Recent Labs  Lab 09/05/24 2139 09/06/24 0635 09/06/24 1127 09/07/24 0335  WBC 3.2* 3.1* 2.8* 3.4*  NEUTROABS 2.6 1.9 2.1  --   HGB 13.3 12.2* 10.4* 10.8*  HCT 40.0 36.0* 31.5* 33.4*  MCV 92.4 91.1 93 93.3  PLT 88* 90* 71* 63*   Basic Metabolic Panel: Recent Labs  Lab 09/05/24 2038 09/06/24 0635 09/07/24 0335  NA 141 137 134*  K  4.3 4.2 3.4*  CL 100 96* 99  CO2 18* 20* 23  GLUCOSE 74 68* 84  BUN 13 11 6   CREATININE 0.49* 0.46* 0.45*  CALCIUM  8.7* 8.6* 8.8*  MG 2.0  --   --    GFR: Estimated Creatinine Clearance: 118 mL/min (A) (by C-G formula based on SCr of 0.45 mg/dL (L)). Liver Function Tests: Recent Labs  Lab 09/05/24 2038 09/06/24 0635 09/07/24 0335  AST 75* 69* 54*  ALT 39 33 27  ALKPHOS 97 76 72  BILITOT 1.2 0.9 1.3*  PROT 8.3* 7.9 6.8  ALBUMIN 4.3 4.0 3.4*   CBG: Recent Labs  Lab 09/06/24 0806  GLUCAP 154*    No results found for this or any previous visit (from the past 240 hours).   Radiology Studies: No results found.   Scheduled Meds:  bictegravir-emtricitabine -tenofovir  AF  1 tablet Oral Daily   folic acid   1 mg Oral Daily   gabapentin   200 mg Oral TID   heparin  injection (subcutaneous)  5,000 Units Subcutaneous Q12H   levETIRAcetam   500 mg Oral BID   lidocaine   1 patch Transdermal Q24H    lipase/protease/amylase  36,000 Units Oral TID AC   LORazepam   0-4 mg Intravenous Q12H   Or   LORazepam   0-4 mg Oral Q12H   naltrexone   50 mg Oral Daily   pantoprazole   40 mg Oral Daily   sodium chloride  flush  10-40 mL Intracatheter Q12H   sulfamethoxazole -trimethoprim   1 tablet Oral Q M,W,F   thiamine   100 mg Oral Daily   Or   thiamine   100 mg Intravenous Daily   Continuous Infusions:   LOS: 2 days  MDM: Patient is high risk for one or more organ failure.  They necessitate ongoing hospitalization for continued IV therapies and subsequent lab monitoring. Total time spent interpreting labs and vitals, reviewing the medical record, coordinating care amongst consultants and care team members, directly assessing and discussing care with the patient and/or family: 55 min  Loriene Taunton, DO [facility]  To contact the attending physician between 7A-7P please use Epic Chat. To contact the covering physician during after hours 7P-7A, please review Amion.  09/08/2024, 7:33 AM   *This document has been created with the assistance of dictation software. Please excuse typographical errors. *

## 2024-09-08 NOTE — Progress Notes (Signed)
 Patient with new HIV/AIDS diagnosis. Referral sent to Mliss Bull, HIV case manager. Referral also sent to congregational/community nursing.   Patient provided with updated court dates.   Order for PT/OT evals obtained.

## 2024-09-09 DIAGNOSIS — D696 Thrombocytopenia, unspecified: Secondary | ICD-10-CM | POA: Diagnosis not present

## 2024-09-09 DIAGNOSIS — F10931 Alcohol use, unspecified with withdrawal delirium: Secondary | ICD-10-CM | POA: Diagnosis not present

## 2024-09-09 DIAGNOSIS — R569 Unspecified convulsions: Secondary | ICD-10-CM | POA: Diagnosis not present

## 2024-09-09 MED ORDER — MORPHINE SULFATE (PF) 2 MG/ML IV SOLN
1.0000 mg | INTRAVENOUS | Status: DC | PRN
  Administered 2024-09-09: 1 mg via INTRAVENOUS
  Filled 2024-09-09: qty 1

## 2024-09-09 MED ORDER — MORPHINE SULFATE (PF) 2 MG/ML IV SOLN
2.0000 mg | INTRAVENOUS | Status: DC | PRN
  Administered 2024-09-09 – 2024-09-10 (×3): 2 mg via INTRAVENOUS
  Filled 2024-09-09 (×4): qty 1

## 2024-09-09 NOTE — Progress Notes (Signed)
 PROGRESS NOTE    Gary [name]  FMW:[id number] DOB: January 18, 1986 DOA: 09/06/2024 PCP: Pcp, No  Chief Complaint  Patient presents with   [facility] Course:  Gary [name] is a 38 year old male with HIV, alcohol  abuse, opioid use disorder, seizure disorder, chronic pancreatitis, depression, bipolar disorder, well-known to our service due to frequent admissions.  He presents on this admission with alcohol  abuse, withdrawal seizure, and thrombocytopenia.  Presently patient drinks 2 Fifths of vodka each day.  He has been nonadherent to his medications outpatient.  On arrival to the ED heart rate in the 100s, blood pressure stable.  WBC 3.2, alcohol  level 326.  Patient had witnessed GTC seizure in the ER requiring IV Versed  and Valium .  He was then admitted for withdrawal.  Subjective: This morning patient is requesting increased pain meds.  He reports his pancreatitis pain is beginning to flare.  He has been eating.  I discussed with patient that if his pancreatitis is flaring he should be NPO.  Patient is upset at the suggestion and would like to continue eating.  Discussed with patient that if he continues to have good appetite then likely pancreatitis is resolving and he no longer needs IV opioid pain medication.  Patient reports he does need higher pain medication.  Ultimately we decided to proceed with n.p.o. and continue on previous dose of morphine  2 mg every 4 hours.  We will not increase this.  Patient is aware  Objective: Vitals:   09/09/24 0507 09/09/24 0803 09/09/24 1026 09/09/24 1236  BP: 94/62 93/68 93/68  101/66  Pulse: 78 78 78 87  Resp: 18 17  18   Temp: 97.7 F (36.5 C) 97.8 F (36.6 C)  98.3 F (36.8 C)  TempSrc:  Oral    SpO2: 95% 99%  97%  Weight:      Height:        Intake/Output Summary (Last 24 hours) at 09/09/2024 1456 Last data filed at 09/09/2024 1300 Gross per 24 hour  Intake --  Output 1050 ml  Net -1050 ml   Filed Weights   09/06/24 0603   Weight: 66 kg    Examination: General exam: Appears calm and comfortable, chronically ill-appearing, frail Respiratory system: No work of breathing, symmetric chest wall expansion Cardiovascular system: S1 & S2 heard, RRR.  Gastrointestinal system: Abdomen is nondistended, soft, minimally tender to palpation in epigastrium. Neuro: Alert, oriented Extremities: Symmetric, expected ROM  Assessment & Plan:  Principal Problem:   Alcohol  withdrawal seizure (HCC) Active Problems:   Alcohol  use disorder, severe, dependence (HCC)   Seizure (HCC)   HIV disease (HCC)   Depression with anxiety   Polysubstance abuse (HCC)   GERD (gastroesophageal reflux disease)   Thrombocytopenia   AIDS (HCC)   Seizure - Withdrawal seizure in the ER on day of arrival in setting of acute alcoholic intoxication.  Patient drinks high-volume EtOH, suspect he was already withdrawing - This is a recurrent issue - Status post Ativan  and Valium  - Continue home dose Keppra  dosing - Keppra  level was ordered though patient has been nonadherent to medications - Continue with seizure precautions  Alcohol  use disorder, severe, dependence Alcohol  withdrawal - Patient currently drinking 2 bottles (fifths) of vodka daily - EtOH on arrival in the 300s - Continue with CIWA protocol - Currently on scheduled Ativan  2 mg every 6 hours.  Some tremor on evaluation today with elevated heart rate.  Will not attempt to taper at this time. - Consider phenobarb if  needed - Patient has been counseled extensively on the importance of abstinence from alcohol .  He has no desire to quit at this time - Continue folic acid  and thiamine  supplementation.  Will need multivitamin at DC - Is on naltrexone  at home though he is not taking it.  Has been continued here.  Depression with anxiety Bipolar disorder - Psych was consulted, does have history of IVC - Patient did meet with psych MD during this admission and denies SI/HI.  Denies  need for psychiatric resources for alcohol  use disorder  HIV AIDS -AIDS appears to be new diagnosis for this patient.  CD4 count has been gradually declining, 598 in January 2025.  182 in September.CD4 165 now. - Patient has been nonadherent to his medications at home - Will continue with prophylactic Bactrim  at this time - Does not appear to have active infection - Restart Biktarvy  - Will ensure he has infectious disease follow-up at discharge.  HIV/AIDS liaison working with the patient to ensure follow-up - Have extensively discussed the AIDS diagnosis with the patient.  He performed appropriate teach back  Polysubstance abuse Opioid use disorder - Frequent admissions for with opioid medication seeking behaviors - Attempted tapering morphine  to 1 mg every 4 hours.  Patient reports his pain is now severe and he needs higher doses.  He is attributing all pain today to pancreatitis.  Given pancreatitis pain I have made the patient n.p.o. - Continue with current morphine  2 mg every 4 hours - Continue multimodal pain regimen with lidocaine  patch, gabapentin , Toradol , Tylenol .  We will not provide additional opioid therapy.  Have discussed this with bedside RN as well. - Has met with psychiatry and TOC and is refusing outpatient resources.  GERD - Continue PPI  Thrombocytopenia, chronic - Complicated by HIV and EtOH abuse - Platelets stable at this time.  No active bleeding. - Continue to monitor  Chronic pancreatitis - Continue home dose Creon . - N.p.o. for acute flare now.  See pain management strategies as above  Unstable housing Frequent readmissions -Nurse navigator for underserved populations has been consulted and is working with patient  Aggressive behaviors - Patient has been advised that verbal and physical aggression will not be tolerated.  We have extensively discussed he will not be receiving increased doses of morphine  as there is no indication for this. - Patient was  also reminded that he is not currently under IVC and if he is dissatisfied with his care plan he may leave the hospital AMA  DVT prophylaxis: Heparin    Code Status: Full Code Disposition: Undergoing alcohol  detox.  Will discharge when off of Ativan   Consultants:    Procedures:    Antimicrobials:  Anti-infectives (From admission, onward)    Start     Dose/Rate Route Frequency Ordered Stop   09/06/24 1000  dolutegravir  (TIVICAY ) tablet 50 mg  Status:  Discontinued        50 mg Oral 2 times daily 09/06/24 0726 09/06/24 0816   09/06/24 1000  emtricitabine -tenofovir  AF (DESCOVY) 200-25 MG per tablet 1 tablet  Status:  Discontinued        1 tablet Oral Daily 09/06/24 0726 09/06/24 0816   09/06/24 1000  sulfamethoxazole -trimethoprim  (BACTRIM  DS) 800-160 MG per tablet 1 tablet        1 tablet Oral Every M-W-F 09/06/24 0726     09/06/24 1000  bictegravir-emtricitabine -tenofovir  AF (BIKTARVY ) 50-200-25 MG per tablet 1 tablet        1 tablet Oral Daily 09/06/24 0817  Data Reviewed: I have personally reviewed following labs and imaging studies CBC: Recent Labs  Lab 09/05/24 2139 09/06/24 0635 09/06/24 1127 09/07/24 0335  WBC 3.2* 3.1* 2.8* 3.4*  NEUTROABS 2.6 1.9 2.1  --   HGB 13.3 12.2* 10.4* 10.8*  HCT 40.0 36.0* 31.5* 33.4*  MCV 92.4 91.1 93 93.3  PLT 88* 90* 71* 63*   Basic Metabolic Panel: Recent Labs  Lab 09/05/24 2038 09/06/24 0635 09/07/24 0335  NA 141 137 134*  K 4.3 4.2 3.4*  CL 100 96* 99  CO2 18* 20* 23  GLUCOSE 74 68* 84  BUN 13 11 6   CREATININE 0.49* 0.46* 0.45*  CALCIUM  8.7* 8.6* 8.8*  MG 2.0  --   --    GFR: Estimated Creatinine Clearance: 118 mL/min (A) (by C-G formula based on SCr of 0.45 mg/dL (L)). Liver Function Tests: Recent Labs  Lab 09/05/24 2038 09/06/24 0635 09/07/24 0335  AST 75* 69* 54*  ALT 39 33 27  ALKPHOS 97 76 72  BILITOT 1.2 0.9 1.3*  PROT 8.3* 7.9 6.8  ALBUMIN 4.3 4.0 3.4*   CBG: Recent Labs  Lab 09/06/24 0806   GLUCAP 154*    No results found for this or any previous visit (from the past 240 hours).   Radiology Studies: No results found.   Scheduled Meds:  bictegravir-emtricitabine -tenofovir  AF  1 tablet Oral Daily   folic acid   1 mg Oral Daily   gabapentin   200 mg Oral TID   heparin  injection (subcutaneous)  5,000 Units Subcutaneous Q12H   levETIRAcetam   500 mg Oral BID   lidocaine   1 patch Transdermal Q24H   lipase/protease/amylase  36,000 Units Oral TID AC   LORazepam   2 mg Intravenous Q6H   naltrexone   50 mg Oral Daily   pantoprazole   40 mg Oral Daily   sodium chloride  flush  10-40 mL Intracatheter Q12H   sulfamethoxazole -trimethoprim   1 tablet Oral Q M,W,F   thiamine   100 mg Oral Daily   Or   thiamine   100 mg Intravenous Daily   Continuous Infusions:   LOS: 3 days  MDM: Patient is high risk for one or more organ failure.  They necessitate ongoing hospitalization for continued IV therapies and subsequent lab monitoring. Total time spent interpreting labs and vitals, reviewing the medical record, coordinating care amongst consultants and care team members, directly assessing and discussing care with the patient and/or family: 55 min  Murriel Eidem, DO [facility]  To contact the attending physician between 7A-7P please use Epic Chat. To contact the covering physician during after hours 7P-7A, please review Amion.  09/09/2024, 2:56 PM   *This document has been created with the assistance of dictation software. Please excuse typographical errors. *

## 2024-09-09 NOTE — Plan of Care (Signed)
  Problem: Clinical Measurements: Goal: Will remain free from infection Outcome: Progressing   Problem: Activity: Goal: Risk for activity intolerance will decrease Outcome: Progressing   Problem: Coping: Goal: Level of anxiety will decrease Outcome: Progressing   Problem: Pain Managment: Goal: General experience of comfort will improve and/or be controlled Outcome: Progressing

## 2024-09-09 NOTE — Plan of Care (Signed)
  Problem: Education: Goal: Knowledge of General Education information will improve Description: Including pain rating scale, medication(s)/side effects and non-pharmacologic comfort measures Outcome: Not Progressing   Problem: Health Behavior/Discharge Planning: Goal: Ability to manage health-related needs will improve Outcome: Not Progressing   Problem: Clinical Measurements: Goal: Will remain free from infection Outcome: Progressing Goal: Respiratory complications will improve Outcome: Progressing   Problem: Activity: Goal: Risk for activity intolerance will decrease Outcome: Not Progressing

## 2024-09-10 DIAGNOSIS — R569 Unspecified convulsions: Secondary | ICD-10-CM | POA: Diagnosis not present

## 2024-09-10 DIAGNOSIS — D696 Thrombocytopenia, unspecified: Secondary | ICD-10-CM | POA: Diagnosis not present

## 2024-09-10 DIAGNOSIS — F10931 Alcohol use, unspecified with withdrawal delirium: Secondary | ICD-10-CM | POA: Diagnosis not present

## 2024-09-10 MED ORDER — LORAZEPAM 1 MG PO TABS
1.0000 mg | ORAL_TABLET | Freq: Four times a day (QID) | ORAL | Status: DC
  Administered 2024-09-10 – 2024-09-11 (×4): 1 mg via ORAL
  Filled 2024-09-10 (×4): qty 1

## 2024-09-10 MED ORDER — MORPHINE SULFATE (PF) 2 MG/ML IV SOLN
1.0000 mg | INTRAVENOUS | Status: DC | PRN
  Administered 2024-09-10 – 2024-09-11 (×2): 1 mg via INTRAVENOUS
  Filled 2024-09-10 (×2): qty 1

## 2024-09-10 MED ORDER — LORAZEPAM 2 MG PO TABS: 2.0000 mg | ORAL_TABLET | Freq: Four times a day (QID) | ORAL | Status: DC

## 2024-09-10 MED ORDER — MORPHINE SULFATE (PF) 2 MG/ML IV SOLN: 1.0000 mg | INTRAVENOUS | Status: DC | PRN

## 2024-09-10 NOTE — Plan of Care (Signed)
  Problem: Health Behavior/Discharge Planning: Goal: Ability to manage health-related needs will improve Outcome: Progressing   Problem: Clinical Measurements: Goal: Will remain free from infection Outcome: Progressing Goal: Cardiovascular complication will be avoided Outcome: Progressing   Problem: Activity: Goal: Risk for activity intolerance will decrease Outcome: Progressing   Problem: Nutrition: Goal: Adequate nutrition will be maintained Outcome: Progressing   Problem: Coping: Goal: Level of anxiety will decrease Outcome: Progressing   Problem: Pain Managment: Goal: General experience of comfort will improve and/or be controlled Outcome: Progressing

## 2024-09-10 NOTE — Progress Notes (Signed)
 Per Dr Marquette Sites, dc tele monitoring

## 2024-09-10 NOTE — Progress Notes (Signed)
 PROGRESS NOTE    Gary [name]  FMW:[id number] DOB: 10/06/1986 DOA: 09/06/2024 PCP: Pcp, No  Chief Complaint  Patient presents with   [facility] Course:  Gary [name] is a 38 year old male with HIV, alcohol  abuse, opioid use disorder, seizure disorder, chronic pancreatitis, depression, bipolar disorder, well-known to our service due to frequent admissions.  He presents on this admission with alcohol  abuse, withdrawal seizure, and thrombocytopenia.  Presently patient drinks 2 Fifths of vodka each day.  He has been nonadherent to his medications outpatient.  On arrival to the ED heart rate in the 100s, blood pressure stable.  WBC 3.2, alcohol  level 326.  Patient had witnessed GTC seizure in the ER requiring IV Versed  and Valium .  He was then admitted for withdrawal.  Subjective: Patient reports his withdrawal is starting to improve.  He is now endorsing some hip pain.  He is endorsing hunger.  We discussed initiating a low-fat diet today with plans to decrease morphine .  He is amendable to this.  Objective: Vitals:   09/10/24 0003 09/10/24 0408 09/10/24 0423 09/10/24 0955  BP: 103/62 101/68 101/68 101/68  Pulse: 74 79 79 79  Resp: 18 18    Temp: 98.2 F (36.8 C) (!) 97.5 F (36.4 C)    TempSrc:      SpO2: 98% 99%    Weight:      Height:        Intake/Output Summary (Last 24 hours) at 09/10/2024 1440 Last data filed at 09/09/2024 1914 Gross per 24 hour  Intake --  Output 300 ml  Net -300 ml   Filed Weights   09/06/24 0603  Weight: 66 kg    Examination: General exam: Appears calm and comfortable, chronically ill-appearing, frail Respiratory system: No work of breathing, symmetric chest wall expansion Cardiovascular system: S1 & S2 heard, RRR.  Gastrointestinal system: Abdomen is nondistended, soft, minimally tender to palpation in epigastrium. Neuro: Alert, oriented Extremities: Symmetric, expected ROM  Assessment & Plan:  Principal Problem:   Alcohol   withdrawal seizure (HCC) Active Problems:   Alcohol  use disorder, severe, dependence (HCC)   Seizure (HCC)   HIV disease (HCC)   Depression with anxiety   Polysubstance abuse (HCC)   GERD (gastroesophageal reflux disease)   Thrombocytopenia   AIDS (HCC)   Seizure - Withdrawal seizure in the ER on day of arrival in setting of acute alcoholic intoxication.  Patient drinks high-volume EtOH, suspect he was already withdrawing - This is a recurrent issue - Status post Ativan  and Valium  - Continue home dose Keppra  dosing - Keppra  level was ordered though patient has been nonadherent to medications - Continue seizure precautions  Alcohol  use disorder, severe, dependence Alcohol  withdrawal - Patient currently drinking 2 bottles (fifths) of vodka daily - EtOH on arrival in the 300s - Continue CIWA protocol - Begin tapering of Ativan  today.  Currently 1 mg every 6 hours, anticipate we can space to every 8 hours. - Patient has been counseled extensively on the importance of abstinence from alcohol .  He endorses no desire to quit at this time. - Continue with vitamin supplementation - Currently on naltrexone , he is meant to be on it at home but has not been taking.  Depression with anxiety Bipolar disorder - Psych was consulted, does have history of IVC - Patient did meet with psych MD during this admission and denies SI/HI.  Denies need for psychiatric resources for alcohol  use disorder  HIV AIDS -AIDS appears to be new  diagnosis for this patient.  CD4 count has been gradually declining, 598 in January 2025.  182 in September. CD4 165 now. - Patient has been nonadherent to his medications at home - Will continue with prophylactic Bactrim  at this time - Does not appear to have active infection - Restart Biktarvy  - Will ensure he has infectious disease follow-up at discharge.  HIV/AIDS liaison working with the patient to ensure follow-up - Have extensively discussed the AIDS diagnosis  with the patient.  He performed appropriate teach back  Polysubstance abuse Opioid use disorder - Frequent admissions for with opioid medication seeking behaviors - Continue tapering morphine  today.  Plan to discontinue entirely tomorrow. - Continue multimodal pain regimen with lidocaine  patch, gabapentin , Toradol , Tylenol .  We will not provide additional opioid therapy.  Have discussed this with bedside RN as well. - Has met with psychiatry and TOC and is refusing outpatient resources.  GERD - Continue PPI  Thrombocytopenia, chronic - Complicated by HIV and EtOH abuse - Platelets stable at this time.  No active bleeding. - Continue to monitor  Chronic pancreatitis - Continue home dose Creon . - Patient endorsing hunger today.  Have advised low-fat diet  Unstable housing Frequent readmissions -Nurse navigator for underserved populations has been consulted and is working with patient  Aggressive behaviors - Patient has been advised that verbal and physical aggression will not be tolerated.  We have extensively discussed he will not be receiving increased doses of morphine  as there is no indication for this. - Patient was also reminded that he is not currently under IVC and if he is dissatisfied with his care plan he may leave the hospital AMA  DVT prophylaxis: Heparin    Code Status: Full Code Disposition: Undergoing alcohol  detox.  Will discharge when off of Ativan   Consultants:    Procedures:    Antimicrobials:  Anti-infectives (From admission, onward)    Start     Dose/Rate Route Frequency Ordered Stop   09/06/24 1000  dolutegravir  (TIVICAY ) tablet 50 mg  Status:  Discontinued        50 mg Oral 2 times daily 09/06/24 0726 09/06/24 0816   09/06/24 1000  emtricitabine -tenofovir  AF (DESCOVY) 200-25 MG per tablet 1 tablet  Status:  Discontinued        1 tablet Oral Daily 09/06/24 0726 09/06/24 0816   09/06/24 1000  sulfamethoxazole -trimethoprim  (BACTRIM  DS) 800-160 MG per  tablet 1 tablet        1 tablet Oral Every M-W-F 09/06/24 0726     09/06/24 1000  bictegravir-emtricitabine -tenofovir  AF (BIKTARVY ) 50-200-25 MG per tablet 1 tablet        1 tablet Oral Daily 09/06/24 0817         Data Reviewed: I have personally reviewed following labs and imaging studies CBC: Recent Labs  Lab 09/05/24 2139 09/06/24 0635 09/06/24 1127 09/07/24 0335  WBC 3.2* 3.1* 2.8* 3.4*  NEUTROABS 2.6 1.9 2.1  --   HGB 13.3 12.2* 10.4* 10.8*  HCT 40.0 36.0* 31.5* 33.4*  MCV 92.4 91.1 93 93.3  PLT 88* 90* 71* 63*   Basic Metabolic Panel: Recent Labs  Lab 09/05/24 2038 09/06/24 0635 09/07/24 0335  NA 141 137 134*  K 4.3 4.2 3.4*  CL 100 96* 99  CO2 18* 20* 23  GLUCOSE 74 68* 84  BUN 13 11 6   CREATININE 0.49* 0.46* 0.45*  CALCIUM  8.7* 8.6* 8.8*  MG 2.0  --   --    GFR: Estimated Creatinine Clearance: 118 mL/min (A) (by  C-G formula based on SCr of 0.45 mg/dL (L)). Liver Function Tests: Recent Labs  Lab 09/05/24 2038 09/06/24 0635 09/07/24 0335  AST 75* 69* 54*  ALT 39 33 27  ALKPHOS 97 76 72  BILITOT 1.2 0.9 1.3*  PROT 8.3* 7.9 6.8  ALBUMIN 4.3 4.0 3.4*   CBG: Recent Labs  Lab 09/06/24 0806  GLUCAP 154*    No results found for this or any previous visit (from the past 240 hours).   Radiology Studies: No results found.   Scheduled Meds:  bictegravir-emtricitabine -tenofovir  AF  1 tablet Oral Daily   folic acid   1 mg Oral Daily   gabapentin   200 mg Oral TID   heparin  injection (subcutaneous)  5,000 Units Subcutaneous Q12H   levETIRAcetam   500 mg Oral BID   lidocaine   1 patch Transdermal Q24H   lipase/protease/amylase  36,000 Units Oral TID AC   LORazepam   1 mg Oral Q6H   naltrexone   50 mg Oral Daily   pantoprazole   40 mg Oral Daily   sodium chloride  flush  10-40 mL Intracatheter Q12H   sulfamethoxazole -trimethoprim   1 tablet Oral Q M,W,F   thiamine   100 mg Oral Daily   Or   thiamine   100 mg Intravenous Daily   Continuous Infusions:    LOS: 4 days  MDM: Patient is high risk for one or more organ failure.  They necessitate ongoing hospitalization for continued IV therapies and subsequent lab monitoring. Total time spent interpreting labs and vitals, reviewing the medical record, coordinating care amongst consultants and care team members, directly assessing and discussing care with the patient and/or family: 55 min  Gautham Hewins, DO [facility]  To contact the attending physician between 7A-7P please use Epic Chat. To contact the covering physician during after hours 7P-7A, please review Amion.  09/10/2024, 2:40 PM   *This document has been created with the assistance of dictation software. Please excuse typographical errors. *

## 2024-09-11 ENCOUNTER — Other Ambulatory Visit (HOSPITAL_COMMUNITY): Payer: Self-pay

## 2024-09-11 ENCOUNTER — Other Ambulatory Visit: Payer: Self-pay

## 2024-09-11 DIAGNOSIS — B20 Human immunodeficiency virus [HIV] disease: Secondary | ICD-10-CM | POA: Diagnosis not present

## 2024-09-11 DIAGNOSIS — F418 Other specified anxiety disorders: Secondary | ICD-10-CM

## 2024-09-11 DIAGNOSIS — F10932 Alcohol use, unspecified with withdrawal with perceptual disturbance: Secondary | ICD-10-CM

## 2024-09-11 DIAGNOSIS — K219 Gastro-esophageal reflux disease without esophagitis: Secondary | ICD-10-CM | POA: Diagnosis not present

## 2024-09-11 DIAGNOSIS — F1910 Other psychoactive substance abuse, uncomplicated: Secondary | ICD-10-CM

## 2024-09-11 MED ORDER — SULFAMETHOXAZOLE-TRIMETHOPRIM 800-160 MG PO TABS
1.0000 | ORAL_TABLET | ORAL | 0 refills | Status: DC
  Filled 2024-09-11: qty 12, 28d supply, fill #0

## 2024-09-11 NOTE — Plan of Care (Signed)
  Problem: Clinical Measurements: Goal: Will remain free from infection Outcome: Progressing Goal: Respiratory complications will improve Outcome: Progressing   Problem: Activity: Goal: Risk for activity intolerance will decrease Outcome: Progressing   Problem: Safety: Goal: Ability to remain free from injury will improve Outcome: Progressing   

## 2024-09-11 NOTE — Discharge Summary (Signed)
 DISCHARGE SUMMARY    Gary [name] FMW:[id number] DOB: [birth year] DOA: 09/06/2024  PCP: Pcp, No  Admit date: 09/06/2024 Discharge date: 09/11/2024   Recommendations for Outpatient Follow-up:  1.  Continue close outpatient follow-up with HIV/AIDS ID team and primary care physician  Hospital Course: Gary [name] is a 38 year old male with HIV, alcohol  abuse, opioid use disorder, seizure disorder, chronic pancreatitis, depression, bipolar disorder, well-known to our service due to frequent admissions.  He presents on this admission with alcohol  abuse, withdrawal seizure, and thrombocytopenia.  Presently patient drinks 2 Fifths of vodka each day.  He has been nonadherent to his medications outpatient.  On arrival to the ED heart rate in the 100s, blood pressure stable.  WBC 3.2, alcohol  level 326.  Patient had witnessed GTC seizure in the ER requiring IV Versed  and Valium .  He was then admitted for withdrawal.  Stay was briefly complicated by pain for which patient required IV morphine  which was gradually tapered.  By 11/3 patient appears to be over his alcohol  withdrawal and pain is well-managed with p.o. options.  He is in agreement with discharge home.  TOC was consulted to ensure adequate transportation and meds to beds were provided at no cost.    Seizure - Withdrawal seizure in the ER on day of arrival in setting of acute alcoholic intoxication.  Patient drinks high-volume EtOH, suspect he was already withdrawing - This is a recurrent issue - Status post Ativan  and Valium  - Continue home dose Keppra  dosing - Keppra  level was ordered though patient has been nonadherent to medications - Reiterated the importance of medication adherence outpatient   Alcohol  use disorder, severe, dependence Alcohol  withdrawal - Patient currently drinking 2 bottles (fifths) of vodka daily - EtOH on arrival in the 300s - Required IV Ativan , has been gradually tapered.  No evidence of alcohol  withdrawal  today. - Patient is prescribed naltrexone  but is not taking it.  I offered to prescribe it again at discharge but he refuses   Depression with anxiety Bipolar disorder - Psych was consulted, does have history of IVC - Patient did meet with psych MD during this admission and denies SI/HI.  Denies need for psychiatric resources for alcohol  use disorder   HIV AIDS -AIDS appears to be new diagnosis for this patient.  CD4 count has been gradually declining, 598 in January 2025.  182 in September. CD4 165 now. - Patient has been nonadherent to his medications at home - Does not appear to have active infection -- HIV/AIDS liaison ensured scheduled follow-up - Have extensively discussed the AIDS diagnosis with the patient.  He performed appropriate teach back - Reviewed patient's home medications with him.  He reports he has adequate amount of Biktarvy  but does need new prescription for Bactrim .  This was provided via meds to beds prior to DC.   Polysubstance abuse Opioid use disorder - Frequent admissions for with opioid medication seeking behaviors - Tapered off of opioids now  GERD - Continue PPI   Thrombocytopenia, chronic - Complicated by HIV and EtOH abuse - Platelets stable at this time.  No active bleeding. - Continue to monitor   Chronic pancreatitis - Continue home dose Creon .  Unstable housing Frequent readmissions -Nurse navigator for underserved populations has been consulted and is working with patient   Aggressive behaviors, resolved   Discharge Instructions  Discharge Instructions     Call MD for:  difficulty breathing, headache or visual disturbances   Complete by: As directed  Call MD for:  persistant dizziness or light-headedness   Complete by: As directed    Call MD for:  persistant nausea and vomiting   Complete by: As directed    Call MD for:  severe uncontrolled pain   Complete by: As directed    Call MD for:  temperature >100.4   Complete by: As  directed    Diet general   Complete by: As directed    Discharge instructions   Complete by: As directed    Please continue taking all medications as prescribed. Please follow up with the Infectious Disease team for HIV/AIDS treatment.   Do not drink alcohol .   Increase activity slowly   Complete by: As directed       Allergies as of 09/11/2024       Reactions   Risperidone  And Paliperidone Other (See Comments)   Patient states causes opposite effect (he will not take again), and gave him psychotic thoughts   Tegretol  [carbamazepine ] Other (See Comments)   Vertigo  Paralysis   Caffeine Palpitations        Medication List     STOP taking these medications    chlordiazePOXIDE  25 MG capsule Commonly known as: LIBRIUM    diclofenac  Sodium 1 % Gel Commonly known as: Voltaren    folic acid  1 MG tablet Commonly known as: FOLVITE    lidocaine  5 % Commonly known as: Lidoderm    naltrexone  50 MG tablet Commonly known as: DEPADE   ondansetron  4 MG tablet Commonly known as: ZOFRAN    pantoprazole  40 MG tablet Commonly known as: PROTONIX    PHENobarbital  32.4 MG tablet Commonly known as: LUMINAL   thiamine  100 MG tablet Commonly known as: VITAMIN B1   Tivicay  50 MG tablet Generic drug: dolutegravir    Truvada  200-300 MG tablet Generic drug: emtricitabine -tenofovir        TAKE these medications    Biktarvy  50-200-25 MG Tabs tablet Generic drug: bictegravir-emtricitabine -tenofovir  AF Take 1 tablet by mouth daily.   Creon  36000-114000 units Cpep capsule Generic drug: lipase/protease/amylase Take 1 capsule (36,000 Units total) by mouth 3 (three) times daily before meals.   gabapentin  100 MG capsule Commonly known as: NEURONTIN  Take 1 capsule (100 mg total) by mouth at bedtime. What changed: when to take this   levETIRAcetam  500 MG tablet Commonly known as: KEPPRA  Take 1 tablet (500 mg total) by mouth 2 (two) times daily.   sulfamethoxazole -trimethoprim   800-160 MG tablet Commonly known as: BACTRIM  DS Take 1 tablet by mouth every Monday, Wednesday, and Friday.        Follow-up Information     Gary Constance DASEN, MD. Go on 09/14/2024.   Specialty: Infectious Diseases Why: Please go to your appointment on November 6th at 0930.Please arrive 15 minutes early for check-in. Thank you Contact information: [address] [place] [address] [phone]                Allergies  Allergen Reactions   Risperidone  And Paliperidone Other (See Comments)    Patient states causes opposite effect (he will not take again), and gave him psychotic thoughts   Tegretol  [Carbamazepine ] Other (See Comments)    Vertigo  Paralysis   Caffeine Palpitations    Consultations:    Procedures/Studies: DG Hip Unilat W or Wo Pelvis 2-3 Views Left Result Date: 09/05/2024 EXAM: 2 or 3 VIEW(S) XRAY OF THE LEFT HIP 09/05/2024 10:22:00 PM COMPARISON: X-ray pelvis 08/29/2024. CLINICAL HISTORY: Fall. The patient states left hip pain due to falling today, yesterday,  and the day before. Patient states difficulty straightening leg out, feels less pain when leg is in frogleg position. FINDINGS: BONES AND JOINTS: No acute fracture or focal osseous lesion. The hip joint is maintained. No significant degenerative changes. SOFT TISSUES: The soft tissues are unremarkable. IMPRESSION: 1. No acute fracture or dislocation of the left hip or visualized pelvis. Electronically signed by: Morgane Naveau MD 09/05/2024 10:36 PM EDT RP Workstation: [id number]   CT PELVIS WO CONTRAST Result Date: 08/31/2024 CLINICAL DATA:  Right hip pain EXAM: CT PELVIS WITHOUT CONTRAST TECHNIQUE: Multidetector CT imaging of the pelvis was performed following the standard protocol without intravenous contrast. RADIATION DOSE REDUCTION: This exam was performed according to the departmental dose-optimization program which includes automated exposure control, adjustment of the mA and/or kV  according to patient size and/or use of iterative reconstruction technique. COMPARISON:  08/29/2024 FINDINGS: Urinary Tract:  Bladder is well distended. Bowel: No obstructive or inflammatory changes of colon are seen. Small bowel and appendix are within normal limits. Diverticular changes seen without diverticulitis. Vascular/Lymphatic: No pathologically enlarged lymph nodes. No significant vascular abnormality seen. Reproductive:  Prostate is within normal limits. Other:  No free fluid is noted. Musculoskeletal: No acute abnormality noted. Electronically Signed   By: Oneil Devonshire M.D.   On: 08/31/2024 23:41   CT Head Wo Contrast Result Date: 08/31/2024 CLINICAL DATA:  Recent injury with headaches, initial encounter EXAM: CT HEAD WITHOUT CONTRAST TECHNIQUE: Contiguous axial images were obtained from the base of the skull through the vertex without intravenous contrast. RADIATION DOSE REDUCTION: This exam was performed according to the departmental dose-optimization program which includes automated exposure control, adjustment of the mA and/or kV according to patient size and/or use of iterative reconstruction technique. COMPARISON:  08/15/2024 FINDINGS: Brain: No evidence of acute infarction, hemorrhage, hydrocephalus, extra-axial collection or mass lesion/mass effect. Mild atrophic changes are noted stable from the prior exam. Vascular: No hyperdense vessel or unexpected calcification. Skull: Normal. Negative for fracture or focal lesion. Sinuses/Orbits: No acute finding. Other: None. IMPRESSION: Chronic atrophic changes without acute abnormality. Electronically Signed   By: Oneil Devonshire M.D.   On: 08/31/2024 23:33   DG Hip Unilat W or Wo Pelvis 2-3 Views Left Result Date: 08/29/2024 EXAM: 2 or 3 VIEW(S) XRAY OF THE LEFT HIP 08/29/2024 03:12:51 PM COMPARISON: None available. CLINICAL HISTORY: Left sided hip pain post fall. FINDINGS: No acute fracture or dislocation. Femoral heads are seated within the  acetabula. Sacroiliac joints and pubic symphysis are anatomically aligned. No significant degenerative changes. IMPRESSION: 1. No acute osseous abnormality. Electronically signed by: Harrietta Sherry MD 08/29/2024 03:38 PM EDT RP Workstation: [id number]   DG Chest Port 1 View Result Date: 08/15/2024 CLINICAL DATA:  Fall.  Alcohol  use. EXAM: PORTABLE CHEST 1 VIEW COMPARISON:  05/02/2024 FINDINGS: Patient rotation limits the examination. Shallow inspiration with elevation of the left hemidiaphragm. Heart size is obscured. There is likely linear atelectasis in the left base. No suggestion of focal consolidation. No pleural effusion or pneumothorax. IMPRESSION: Shallow inspiration with probable linear atelectasis in the left base. Electronically Signed   By: Elsie Gravely M.D.   On: 08/15/2024 23:59   CT HEAD WO CONTRAST Result Date: 08/15/2024 CLINICAL DATA:  Headache, post traumatic; Neck trauma, intoxicated or obtunded (Age >= 16y) EXAM: CT HEAD WITHOUT CONTRAST CT CERVICAL SPINE WITHOUT CONTRAST TECHNIQUE: Multidetector CT imaging of the head and cervical spine was performed following the standard protocol without intravenous contrast. Multiplanar CT image reconstructions of the cervical spine were also  generated. RADIATION DOSE REDUCTION: This exam was performed according to the departmental dose-optimization program which includes automated exposure control, adjustment of the mA and/or kV according to patient size and/or use of iterative reconstruction technique. COMPARISON:  CT head and C-spine 04/30/2024. FINDINGS: CT HEAD FINDINGS Brain: No evidence of large-territorial acute infarction. No parenchymal hemorrhage. No mass lesion. No extra-axial collection. No mass effect or midline shift. No hydrocephalus. Basilar cisterns are patent. Vascular: No hyperdense vessel. Skull: No acute fracture or focal lesion. Sinuses/Orbits: Paranasal sinuses and mastoid air cells are clear. The orbits are unremarkable.  Other: None. CT CERVICAL SPINE FINDINGS Alignment: Mild retrolisthesis of C4 on C5. Skull base and vertebrae: No acute fracture. No aggressive appearing focal osseous lesion or focal pathologic process. Soft tissues and spinal canal: No prevertebral fluid or swelling. No visible canal hematoma. Upper chest: Unremarkable. Other: None. IMPRESSION: 1. No acute intracranial abnormality. 2. No acute displaced fracture or traumatic listhesis of the cervical spine. Electronically Signed   By: Morgane  Naveau M.D.   On: 08/15/2024 23:33   CT Cervical Spine Wo Contrast Result Date: 08/15/2024 CLINICAL DATA:  Headache, post traumatic; Neck trauma, intoxicated or obtunded (Age >= 16y) EXAM: CT HEAD WITHOUT CONTRAST CT CERVICAL SPINE WITHOUT CONTRAST TECHNIQUE: Multidetector CT imaging of the head and cervical spine was performed following the standard protocol without intravenous contrast. Multiplanar CT image reconstructions of the cervical spine were also generated. RADIATION DOSE REDUCTION: This exam was performed according to the departmental dose-optimization program which includes automated exposure control, adjustment of the mA and/or kV according to patient size and/or use of iterative reconstruction technique. COMPARISON:  CT head and C-spine 04/30/2024. FINDINGS: CT HEAD FINDINGS Brain: No evidence of large-territorial acute infarction. No parenchymal hemorrhage. No mass lesion. No extra-axial collection. No mass effect or midline shift. No hydrocephalus. Basilar cisterns are patent. Vascular: No hyperdense vessel. Skull: No acute fracture or focal lesion. Sinuses/Orbits: Paranasal sinuses and mastoid air cells are clear. The orbits are unremarkable. Other: None. CT CERVICAL SPINE FINDINGS Alignment: Mild retrolisthesis of C4 on C5. Skull base and vertebrae: No acute fracture. No aggressive appearing focal osseous lesion or focal pathologic process. Soft tissues and spinal canal: No prevertebral fluid or swelling.  No visible canal hematoma. Upper chest: Unremarkable. Other: None. IMPRESSION: 1. No acute intracranial abnormality. 2. No acute displaced fracture or traumatic listhesis of the cervical spine. Electronically Signed   By: Morgane  Naveau M.D.   On: 08/15/2024 23:33      Discharge Exam: Vitals:   09/11/24 0500 09/11/24 0613  BP: 102/68 94/65  Pulse: 69 68  Resp:    Temp:    SpO2:     Vitals:   09/11/24 0128 09/11/24 0410 09/11/24 0500 09/11/24 0613  BP: 101/67 100/65 102/68 94/65  Pulse:  77 69 68  Resp:  16    Temp:  97.8 F (36.6 C)    TempSrc:      SpO2:  98%    Weight:      Height:        General exam: Appears calm and comfortable, chronically ill-appearing, frail Respiratory system: No work of breathing, symmetric chest wall expansion Cardiovascular system: S1 & S2 heard, RRR.  Gastrointestinal system: Abdomen is nondistended, soft, minimally tender to palpation in epigastrium. Neuro: Alert, oriented Extremities: Symmetric, expected ROM   The results of significant diagnostics from this hospitalization (including imaging, microbiology, ancillary and laboratory) are listed below for reference.     Microbiology: No results found for  this or any previous visit (from the past 240 hours).   Labs: BNP (last 3 results) Recent Labs    12/08/23 0452 07/31/24 1715  BNP 78.3 16.9   Basic Metabolic Panel: Recent Labs  Lab 09/05/24 2038 09/06/24 0635 09/07/24 0335  NA 141 137 134*  K 4.3 4.2 3.4*  CL 100 96* 99  CO2 18* 20* 23  GLUCOSE 74 68* 84  BUN 13 11 6   CREATININE 0.49* 0.46* 0.45*  CALCIUM  8.7* 8.6* 8.8*  MG 2.0  --   --    Liver Function Tests: Recent Labs  Lab 09/05/24 2038 09/06/24 0635 09/07/24 0335  AST 75* 69* 54*  ALT 39 33 27  ALKPHOS 97 76 72  BILITOT 1.2 0.9 1.3*  PROT 8.3* 7.9 6.8  ALBUMIN 4.3 4.0 3.4*   No results for input(s): LIPASE, AMYLASE in the last 168 hours. No results for input(s): AMMONIA in the last 168  hours. CBC: Recent Labs  Lab 09/05/24 2139 09/06/24 0635 09/06/24 1127 09/07/24 0335  WBC 3.2* 3.1* 2.8* 3.4*  NEUTROABS 2.6 1.9 2.1  --   HGB 13.3 12.2* 10.4* 10.8*  HCT 40.0 36.0* 31.5* 33.4*  MCV 92.4 91.1 93 93.3  PLT 88* 90* 71* 63*   Cardiac Enzymes: No results for input(s): CKTOTAL, CKMB, CKMBINDEX, TROPONINI in the last 168 hours. BNP: Invalid input(s): POCBNP CBG: Recent Labs  Lab 09/06/24 0806  GLUCAP 154*   D-Dimer No results for input(s): DDIMER in the last 72 hours. Hgb A1c No results for input(s): HGBA1C in the last 72 hours. Lipid Profile No results for input(s): CHOL, HDL, LDLCALC, TRIG, CHOLHDL, LDLDIRECT in the last 72 hours. Thyroid  function studies No results for input(s): TSH, T4TOTAL, T3FREE, THYROIDAB in the last 72 hours.  Invalid input(s): FREET3 Anemia work up No results for input(s): VITAMINB12, FOLATE, FERRITIN, TIBC, IRON, RETICCTPCT in the last 72 hours. Urinalysis    Component Value Date/Time   COLORURINE YELLOW 08/08/2024 0456   APPEARANCEUR CLEAR 08/08/2024 0456   APPEARANCEUR Clear 11/15/2014 1755   LABSPEC 1.014 08/08/2024 0456   LABSPEC 1.002 11/15/2014 1755   PHURINE 5.0 08/08/2024 0456   GLUCOSEU NEGATIVE 08/08/2024 0456   GLUCOSEU Negative 11/15/2014 1755   HGBUR NEGATIVE 08/08/2024 0456   BILIRUBINUR NEGATIVE 08/08/2024 0456   BILIRUBINUR Negative 11/15/2014 1755   KETONESUR NEGATIVE 08/08/2024 0456   PROTEINUR NEGATIVE 08/08/2024 0456   NITRITE NEGATIVE 08/08/2024 0456   LEUKOCYTESUR NEGATIVE 08/08/2024 0456   LEUKOCYTESUR Negative 11/15/2014 1755   Sepsis Labs Recent Labs  Lab 09/05/24 2139 09/06/24 0635 09/06/24 1127 09/07/24 0335  WBC 3.2* 3.1* 2.8* 3.4*   Microbiology No results found for this or any previous visit (from the past 240 hours).   Time coordinating discharge: 32 min     SIGNED: Arwyn Besaw, DO [facility] 09/11/2024, 11:58  AM Pager   If 7PM-7AM, please contact night-coverage

## 2024-09-12 ENCOUNTER — Encounter (HOSPITAL_COMMUNITY): Payer: Self-pay

## 2024-09-12 ENCOUNTER — Emergency Department (HOSPITAL_COMMUNITY)
Admission: EM | Admit: 2024-09-12 | Discharge: 2024-09-13 | Disposition: A | Discharge status: Home / Self Care | Payer: MEDICAID | Attending: Emergency Medicine | Admitting: Emergency Medicine

## 2024-09-12 ENCOUNTER — Other Ambulatory Visit: Payer: Self-pay

## 2024-09-12 DIAGNOSIS — F10129 Alcohol abuse with intoxication, unspecified: Secondary | ICD-10-CM | POA: Insufficient documentation

## 2024-09-12 DIAGNOSIS — R52 Pain, unspecified: Secondary | ICD-10-CM | POA: Diagnosis not present

## 2024-09-12 DIAGNOSIS — Z79899 Other long term (current) drug therapy: Secondary | ICD-10-CM | POA: Diagnosis not present

## 2024-09-12 DIAGNOSIS — F1010 Alcohol abuse, uncomplicated: Secondary | ICD-10-CM

## 2024-09-12 NOTE — ED Triage Notes (Signed)
 Pt BIBA from [facility] parking lot, c/o generalized pain. Complains of withdrawals from alcohol , has had alcohol  today. VSS  CBG 108

## 2024-09-12 NOTE — ED Provider Notes (Signed)
 [place] EMERGENCY DEPARTMENT AT [facility] Provider Note   CSN: [id number] Arrival date & time: 09/12/24  2235     Patient presents with: Withdrawal   Gary [name] is a 38 y.o. male.  {Add pertinent medical, surgical, social history, OB history to YEP:67052} The history is provided by the patient and the EMS personnel.  Gary [name] is a 38 y.o. male who presents to the Emergency Department complaining of alcohol  intoxication.  He presents to the emergency department by EMS for evaluation of drinking too much alcohol  and feeling unsafe sleeping outside because it is too cold outside.  He denies any recent injuries.  Denies any SI.  No fevers, chest pain, abdominal pain, vomiting and diarrhea.  He denies any concern for alcohol  withdrawal at this time.     Prior to Admission medications   Medication Sig Start Date End Date Taking? Authorizing Provider  bictegravir-emtricitabine -tenofovir  AF (BIKTARVY ) 50-200-25 MG TABS tablet Take 1 tablet by mouth daily. 02/24/24   Von Bellis, MD  gabapentin  (NEURONTIN ) 100 MG capsule Take 1 capsule (100 mg total) by mouth at bedtime. Patient taking differently: Take 100 mg by mouth 3 (three) times daily. 06/28/24   Tobie Yetta HERO, MD  levETIRAcetam  (KEPPRA ) 500 MG tablet Take 1 tablet (500 mg total) by mouth 2 (two) times daily. Patient not taking: Reported on 09/06/2024 06/28/24   Tobie Yetta HERO, MD  lipase/protease/amylase (CREON ) 36000 UNITS CPEP capsule Take 1 capsule (36,000 Units total) by mouth 3 (three) times daily before meals. Patient not taking: Reported on 09/06/2024 05/26/24   Caleen Qualia, MD  sulfamethoxazole -trimethoprim  (BACTRIM  DS) 800-160 MG tablet Take 1 tablet by mouth every Monday, Wednesday, and Friday. 09/11/24 12/10/24  Dezii, Alexandra, DO  famotidine  (PEPCID ) 20 MG tablet Take 1 tablet (20 mg total) by mouth 2 (two) times daily. Patient not taking: Reported on 06/01/2019 05/14/19 06/02/19  [name],  MD    Allergies: Risperidone  and paliperidone, Tegretol  [carbamazepine ], and Caffeine    Review of Systems  All other systems reviewed and are negative.   Updated Vital Signs BP 99/61 (BP Location: Left Arm)   Pulse 98   Temp 97.7 F (36.5 C) (Oral)   Resp 17   Ht 6' (1.829 m)   Wt 66 kg   SpO2 97%   BMI 19.73 kg/m   Physical Exam Vitals and nursing note reviewed.  Constitutional:      Appearance: He is well-developed.     Comments: Appears intoxicated.  HENT:     Head: Normocephalic and atraumatic.  Cardiovascular:     Rate and Rhythm: Normal rate and regular rhythm.  Pulmonary:     Effort: Pulmonary effort is normal. No respiratory distress.  Abdominal:     Palpations: Abdomen is soft.     Tenderness: There is no abdominal tenderness. There is no guarding or rebound.  Musculoskeletal:        General: No tenderness.  Skin:    General: Skin is warm and dry.  Neurological:     Mental Status: He is alert and oriented to person, place, and time.     Comments: Moves all extremities symmetrically  Psychiatric:        Behavior: Behavior normal.     (all labs ordered are listed, but only abnormal results are displayed) Labs Reviewed - No data to display  EKG: None  Radiology: No results found.  {Document cardiac monitor, telemetry assessment procedure when appropriate:32947} Procedures   Medications Ordered in  the ED - No data to display    {Click here for ABCD2, HEART and other calculators REFRESH Note before signing:1}                              Medical Decision Making  ***  {Document critical care time when appropriate  Document review of labs and clinical decision tools ie [id number], etc  Document your independent review of radiology images and any outside records  Document your discussion with family members, caretakers and with consultants  Document social determinants of health affecting pt's care  Document your decision making why or why  not admission, treatments were needed:32947:::1}   Final diagnoses:  None    ED Discharge Orders     None

## 2024-09-13 NOTE — ED Notes (Signed)
 Patient refused to get VS taken

## 2024-09-13 NOTE — ED Notes (Signed)
Patient ambulated to the bathroom with walker

## 2024-09-14 ENCOUNTER — Ambulatory Visit: Payer: MEDICAID | Admitting: Internal Medicine

## 2024-09-21 ENCOUNTER — Other Ambulatory Visit: Payer: Self-pay

## 2024-09-21 ENCOUNTER — Emergency Department (HOSPITAL_COMMUNITY): Payer: MEDICAID

## 2024-09-21 ENCOUNTER — Emergency Department (HOSPITAL_COMMUNITY)
Admission: EM | Admit: 2024-09-21 | Discharge: 2024-09-21 | Disposition: A | Discharge status: Home / Self Care | Payer: MEDICAID | Attending: Emergency Medicine | Admitting: Emergency Medicine

## 2024-09-21 DIAGNOSIS — Z21 Asymptomatic human immunodeficiency virus [HIV] infection status: Secondary | ICD-10-CM | POA: Diagnosis not present

## 2024-09-21 DIAGNOSIS — W19XXXA Unspecified fall, initial encounter: Secondary | ICD-10-CM | POA: Diagnosis not present

## 2024-09-21 DIAGNOSIS — F1010 Alcohol abuse, uncomplicated: Secondary | ICD-10-CM | POA: Diagnosis not present

## 2024-09-21 DIAGNOSIS — M25552 Pain in left hip: Secondary | ICD-10-CM | POA: Insufficient documentation

## 2024-09-21 DIAGNOSIS — Y909 Presence of alcohol in blood, level not specified: Secondary | ICD-10-CM | POA: Diagnosis not present

## 2024-09-21 MED ORDER — CHLORDIAZEPOXIDE HCL 25 MG PO CAPS
ORAL_CAPSULE | ORAL | 0 refills | Status: DC
  Filled 2024-09-21: qty 10, 3d supply, fill #0

## 2024-09-21 MED ORDER — BICTEGRAVIR-EMTRICITAB-TENOFOV 50-200-25 MG PO TABS
1.0000 | ORAL_TABLET | Freq: Every day | ORAL | 0 refills | Status: DC
  Filled 2024-09-21: qty 30, 30d supply, fill #0

## 2024-09-21 MED ORDER — GABAPENTIN 100 MG PO CAPS
100.0000 mg | ORAL_CAPSULE | Freq: Every day | ORAL | 0 refills | Status: DC
  Filled 2024-09-21: qty 15, 15d supply, fill #0

## 2024-09-21 MED ORDER — CHLORDIAZEPOXIDE HCL 25 MG PO CAPS
25.0000 mg | ORAL_CAPSULE | Freq: Once | ORAL | Status: AC
  Administered 2024-09-21: 25 mg via ORAL
  Filled 2024-09-21: qty 1

## 2024-09-21 MED ORDER — LEVETIRACETAM 500 MG PO TABS
500.0000 mg | ORAL_TABLET | Freq: Two times a day (BID) | ORAL | 0 refills | Status: DC
  Filled 2024-09-21: qty 60, 30d supply, fill #0

## 2024-09-21 NOTE — Discharge Instructions (Addendum)
 Take your medication as prescribed, and obtain the refills provided today as well.  You have been provided resources for outpatient alcohol  abuse therapy, please take advantage of these resources.  Return here for concerning changes in your condition.

## 2024-09-21 NOTE — ED Triage Notes (Signed)
 Patient BIB EMS c/o alcohol  intoxication. Patient stated he drink half of bottle of vodka and wants detox tonight.

## 2024-09-21 NOTE — ED Provider Notes (Signed)
 [place] EMERGENCY DEPARTMENT AT [facility] Provider Note   CSN: [id number] Arrival date & time: 09/21/24  2036     Patient presents with: Alcohol  Intoxication   Gary [name] is a 38 y.o. male.   HPI Patient with multiple medical problems including HIV, substance abuse, alcohol  dependency presents with left hip pain, as well as request for assistance with alcohol  cessation. Patient fell a few days ago, since that time his had pain in his left hip, but has been upright, ambulatory. With soreness there, as well as as needed for assistance with substance abuse counseling he presents for evaluation. He states that he is now sincere about wanting to get help with alcohol  abuse.    Prior to Admission medications   Medication Sig Start Date End Date Taking? Authorizing Provider  chlordiazePOXIDE  (LIBRIUM ) 25 MG capsule 50mg  PO TID x 1D, then 25-50mg  PO BID X 1D, then 25-50mg  PO QD X 1D 09/21/24  Yes Garrick Charleston, MD  bictegravir-emtricitabine -tenofovir  AF (BIKTARVY ) 50-200-25 MG TABS tablet Take 1 tablet by mouth daily. 09/21/24   Garrick Charleston, MD  gabapentin  (NEURONTIN ) 100 MG capsule Take 1 capsule (100 mg total) by mouth at bedtime. 09/21/24   Garrick Charleston, MD  levETIRAcetam  (KEPPRA ) 500 MG tablet Take 1 tablet (500 mg total) by mouth 2 (two) times daily. 09/21/24   Garrick Charleston, MD  lipase/protease/amylase (CREON ) 36000 UNITS CPEP capsule Take 1 capsule (36,000 Units total) by mouth 3 (three) times daily before meals. Patient not taking: Reported on 09/06/2024 05/26/24   Caleen Qualia, MD  sulfamethoxazole -trimethoprim  (BACTRIM  DS) 800-160 MG tablet Take 1 tablet by mouth every Monday, Wednesday, and Friday. 09/11/24 12/10/24  Dezii, Alexandra, DO  famotidine  (PEPCID ) 20 MG tablet Take 1 tablet (20 mg total) by mouth 2 (two) times daily. Patient not taking: Reported on 06/01/2019 05/14/19 06/02/19  [name], MD    Allergies: Risperidone  and  paliperidone, Tegretol  [carbamazepine ], and Caffeine    Review of Systems  Updated Vital Signs BP 107/82   Pulse 100   Temp 98.1 F (36.7 C) (Oral)   Resp 16   SpO2 98%   Physical Exam Vitals and nursing note reviewed.  Constitutional:      General: He is not in acute distress.    Appearance: He is well-developed. He is ill-appearing. He is not toxic-appearing or diaphoretic.  HENT:     Head: Normocephalic and atraumatic.  Eyes:     Conjunctiva/sclera: Conjunctivae normal.  Cardiovascular:     Rate and Rhythm: Normal rate and regular rhythm.  Pulmonary:     Effort: Pulmonary effort is normal. No respiratory distress.     Breath sounds: No stridor.  Abdominal:     General: There is no distension.  Skin:    General: Skin is warm and dry.  Neurological:     Mental Status: He is alert and oriented to person, place, and time.     (all labs ordered are listed, but only abnormal results are displayed) Labs Reviewed - No data to display  EKG: None  Radiology: DG Hip Unilat W or Wo Pelvis 2-3 Views Left Result Date: 09/21/2024 EXAM: 3 VIEW(S) XRAY OF THE UNILATERAL HIP 09/21/2024 09:43:38 PM COMPARISON: 09/05/2024 CLINICAL HISTORY: fall FINDINGS: BONES AND JOINTS: No acute fracture or focal osseous lesion. The hip joint is maintained. No significant degenerative changes. SOFT TISSUES: The soft tissues are unremarkable. IMPRESSION: 1. No acute fracture or dislocation. Electronically signed by: Pinkie Pebbles MD 09/21/2024 09:50 PM EST RP  Workstation: [id number]     Procedures   Medications Ordered in the ED  chlordiazePOXIDE  (LIBRIUM ) capsule 25 mg (has no administration in time range)                                    Medical Decision Making Adult male with polysubstance abuse, HIV, now presents with request for assistance with alcohol  cessation as well as pain in his left hip following a fall.  No evidence for decompensated state, seizure activity currently,  patient's efforts for alcohol  abuse cessation will be facilitated with Librium , outpatient resources. X-ray ordered for hip pain, unremarkable, patient discharged in stable condition.  Amount and/or Complexity of Data Reviewed External Data Reviewed: notes.    Details: 57 ED visits him 6 months Radiology: ordered and independent interpretation performed. Decision-making details documented in ED Course.  Risk Prescription drug management. Decision regarding hospitalization. Diagnosis or treatment significantly limited by social determinants of health.   10:06 PM X-ray unremarkable, no hip fracture   No evidence for decompensated withdrawal, seizures, distress.  As above, patient I discussed mechanisms to minimize likelihood of complications from withdrawal, he will start Librium , has received resources for outpatient follow-up for his alcohol  abuse. Final diagnoses:  Alcohol  abuse  Fall, initial encounter    ED Discharge Orders          Ordered    chlordiazePOXIDE  (LIBRIUM ) 25 MG capsule        09/21/24 2206    bictegravir-emtricitabine -tenofovir  AF (BIKTARVY ) 50-200-25 MG TABS tablet  Daily       Note to Pharmacy: Please send to [facility] clinic; patient will come pick up from here   09/21/24 2206    gabapentin  (NEURONTIN ) 100 MG capsule  Daily at bedtime        09/21/24 2206    levETIRAcetam  (KEPPRA ) 500 MG tablet  2 times daily        09/21/24 2206               Garrick Charleston, MD 09/21/24 2206

## 2024-09-22 ENCOUNTER — Encounter (HOSPITAL_COMMUNITY): Payer: Self-pay | Admitting: *Deleted

## 2024-09-22 ENCOUNTER — Other Ambulatory Visit: Payer: Self-pay

## 2024-09-22 ENCOUNTER — Emergency Department (HOSPITAL_COMMUNITY)
Admission: EM | Admit: 2024-09-22 | Discharge: 2024-09-22 | Disposition: A | Discharge status: Home / Self Care | Payer: Self-pay | Attending: Emergency Medicine | Admitting: Emergency Medicine

## 2024-09-22 ENCOUNTER — Other Ambulatory Visit (HOSPITAL_COMMUNITY): Payer: Self-pay

## 2024-09-22 DIAGNOSIS — F10239 Alcohol dependence with withdrawal, unspecified: Secondary | ICD-10-CM | POA: Insufficient documentation

## 2024-09-22 DIAGNOSIS — Z765 Malingerer [conscious simulation]: Secondary | ICD-10-CM | POA: Insufficient documentation

## 2024-09-22 DIAGNOSIS — Z21 Asymptomatic human immunodeficiency virus [HIV] infection status: Secondary | ICD-10-CM | POA: Insufficient documentation

## 2024-09-22 DIAGNOSIS — Y909 Presence of alcohol in blood, level not specified: Secondary | ICD-10-CM | POA: Insufficient documentation

## 2024-09-22 MED ORDER — LORAZEPAM 1 MG PO TABS
1.0000 mg | ORAL_TABLET | Freq: Once | ORAL | Status: AC
  Administered 2024-09-22: 1 mg via ORAL
  Filled 2024-09-22: qty 1
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
## 2024-09-22 NOTE — ED Triage Notes (Signed)
 Pt discharged earlier tonight and was asleep in the lobby. Wanted to check back in for detox. Pt currently asleep, no withdrawal symptoms noted at this time

## 2024-09-22 NOTE — Progress Notes (Signed)
 Specialty Pharmacy Initial Fill Coordination Note  Gary [name] is a 38 y.o. male contacted today regarding initial fill of specialty medication(s) Bictegravir-Emtricitab-Tenofov (BIKTARVY )   Patient requested Courier to Provider Office   Delivery date: 09/26/24   Verified address: [facility] [address] [place] [address]   Medication will be filled on: 09/25/24   Patient is aware of $0 copayment.

## 2024-09-22 NOTE — Progress Notes (Signed)
 Thank you :)

## 2024-09-22 NOTE — Progress Notes (Signed)
 Good morning specialty pharmacy team - we will not be filling his Biktarvy  as he had not picked up medicine since April. Although he had a refill sent to our clinic in June, he never picked it up. Given he may have been spacing out medication to cause potential resistance, he will need to see a provider and have labs assessed before refilling mediation.  Kimberley/Aileen - can you schedule a follow up appointment with Dr. Overton?   Dr. Overton - if you have any additional advice or thoughts, let me know.  Gary [name], PharmD, CPP, BCIDP, AAHIVP Clinical Pharmacist Practitioner Infectious Diseases Clinical Pharmacist [facility]

## 2024-09-22 NOTE — Discharge Instructions (Addendum)
 Evaluation today was reassuring.  Please pick up your Librium  that was prescribed to you.  Please also follow-up with your PCP.

## 2024-09-22 NOTE — ED Provider Notes (Signed)
  EMERGENCY DEPARTMENT AT [facility] Provider Note   CSN: [id number] Arrival date & time: 09/22/24  9664     Patient presents with: No chief complaint on file.  HPI Gary [name] is a 38 y.o. male with HIV and alcohol  dependency presenting for concern for alcohol  withdrawal.  He was seen for this complaint late last night.  Was discharged in prescribed Librium .  He states he is concerned he might start to withdrawal.  He states his last drink was yesterday.  He denies hallucinations SI or HI at this time.  Denies excessive vomiting.  He states he has not had a chance yet to pick up his Librium .  Shortly after discharge return to the lobby and fell asleep and checked back and for withdrawal.   HPI     Prior to Admission medications   Medication Sig Start Date End Date Taking? Authorizing Provider  bictegravir-emtricitabine -tenofovir  AF (BIKTARVY ) 50-200-25 MG TABS tablet Take 1 tablet by mouth daily. 09/21/24   Garrick Charleston, MD  chlordiazePOXIDE  (LIBRIUM ) 25 MG capsule 50mg  PO TID x 1D, then 25-50mg  PO BID X 1D, then 25-50mg  PO QD X 1D 09/21/24   Garrick Charleston, MD  gabapentin  (NEURONTIN ) 100 MG capsule Take 1 capsule (100 mg total) by mouth at bedtime. 09/21/24   Garrick Charleston, MD  levETIRAcetam  (KEPPRA ) 500 MG tablet Take 1 tablet (500 mg total) by mouth 2 (two) times daily. 09/21/24   Garrick Charleston, MD  lipase/protease/amylase (CREON ) 36000 UNITS CPEP capsule Take 1 capsule (36,000 Units total) by mouth 3 (three) times daily before meals. Patient not taking: Reported on 09/06/2024 05/26/24   Amin, Sumayya, MD  sulfamethoxazole -trimethoprim  (BACTRIM  DS) 800-160 MG tablet Take 1 tablet by mouth every Monday, Wednesday, and Friday. 09/11/24 12/10/24  Dezii, Alexandra, DO  famotidine  (PEPCID ) 20 MG tablet Take 1 tablet (20 mg total) by mouth 2 (two) times daily. Patient not taking: Reported on 06/01/2019 05/14/19 06/02/19  [name], MD    Allergies:  Risperidone  and paliperidone, Tegretol  [carbamazepine ], and Caffeine    Review of Systems See HPI  Updated Vital Signs BP 108/67 (BP Location: Left Arm)   Pulse 97   Temp 98.3 F (36.8 C) (Oral)   Resp 19   SpO2 98%   Physical Exam Vitals and nursing note reviewed.  HENT:     Head: Normocephalic and atraumatic.     Mouth/Throat:     Mouth: Mucous membranes are moist.  Eyes:     General:        Right eye: No discharge.        Left eye: No discharge.     Conjunctiva/sclera: Conjunctivae normal.  Cardiovascular:     Rate and Rhythm: Normal rate and regular rhythm.     Pulses: Normal pulses.     Heart sounds: Normal heart sounds.  Pulmonary:     Effort: Pulmonary effort is normal.     Breath sounds: Normal breath sounds.  Abdominal:     General: Abdomen is flat.     Palpations: Abdomen is soft.  Skin:    General: Skin is warm and dry.  Neurological:     General: No focal deficit present.     Comments: GCS 15. Speech is goal oriented. No deficits appreciated to CN III-XII; symmetric eyebrow raise, no facial drooping, tongue midline. Patient has equal grip strength bilaterally with 5/5 strength against resistance in all major muscle groups bilaterally. Sensation to light touch intact. Patient moves extremities without ataxia. Normal  finger-nose-finger. Patient ambulatory with steady gait. Not tremulous.   Psychiatric:        Mood and Affect: Mood normal.     (all labs ordered are listed, but only abnormal results are displayed) Labs Reviewed - No data to display  EKG: None  Radiology: DG Hip Unilat W or Wo Pelvis 2-3 Views Left Result Date: 09/21/2024 EXAM: 3 VIEW(S) XRAY OF THE UNILATERAL HIP 09/21/2024 09:43:38 PM COMPARISON: 09/05/2024 CLINICAL HISTORY: fall FINDINGS: BONES AND JOINTS: No acute fracture or focal osseous lesion. The hip joint is maintained. No significant degenerative changes. SOFT TISSUES: The soft tissues are unremarkable. IMPRESSION: 1. No acute  fracture or dislocation. Electronically signed by: Pinkie Pebbles MD 09/21/2024 09:50 PM EST RP Workstation: [id number]     Procedures   Medications Ordered in the ED  LORazepam  (ATIVAN ) tablet 1 mg (has no administration in time range)    Clinical Course as of 09/22/24 0658  Fri Sep 22, 2024  0645 SpO2: 98 % [JR]    Clinical Course User Index [JR] [name], PA-C                                 Medical Decision Making  38 year old well-appearing male presenting for alcohol  withdrawal.  Exam was unremarkable and does not appear to be actively withdrawing.  His vitals are also normal.  Gave him 1 mg of p.o. Ativan .  Advised to pick up his prescription of Librium  and offered resources for alcohol  detox here in the area.  Suspect he is likely here for secondary gain.  Gave him a soda and sandwich.  Discussed return precautions.  Also advised to follow-up with PCP.  Discharged.     Final diagnoses:  Malingering    ED Discharge Orders     None          [name], PA-C 09/22/24 9340    Theadore Ozell HERO, MD 09/22/24 [phone]

## 2024-09-24 ENCOUNTER — Emergency Department (HOSPITAL_COMMUNITY)
Admission: EM | Admit: 2024-09-24 | Discharge: 2024-09-25 | Disposition: A | Discharge status: Home / Self Care | Payer: MEDICAID | Attending: Emergency Medicine | Admitting: Emergency Medicine

## 2024-09-24 ENCOUNTER — Other Ambulatory Visit: Payer: Self-pay

## 2024-09-24 DIAGNOSIS — R1013 Epigastric pain: Secondary | ICD-10-CM | POA: Diagnosis present

## 2024-09-24 DIAGNOSIS — G8929 Other chronic pain: Secondary | ICD-10-CM | POA: Insufficient documentation

## 2024-09-24 DIAGNOSIS — M25559 Pain in unspecified hip: Secondary | ICD-10-CM | POA: Insufficient documentation

## 2024-09-24 DIAGNOSIS — R251 Tremor, unspecified: Secondary | ICD-10-CM | POA: Diagnosis not present

## 2024-09-24 DIAGNOSIS — Z21 Asymptomatic human immunodeficiency virus [HIV] infection status: Secondary | ICD-10-CM | POA: Diagnosis not present

## 2024-09-24 DIAGNOSIS — F1010 Alcohol abuse, uncomplicated: Secondary | ICD-10-CM | POA: Diagnosis not present

## 2024-09-24 LAB — MAGNESIUM: Magnesium: 1.9 mg/dL (ref 1.7–2.4)

## 2024-09-24 LAB — LIPASE, BLOOD: Lipase: 43 U/L (ref 11–51)

## 2024-09-24 LAB — COMPREHENSIVE METABOLIC PANEL WITH GFR
ALT: 42 U/L (ref 0–44)
AST: 59 U/L — ABNORMAL HIGH (ref 15–41)
Albumin: 3.6 g/dL (ref 3.5–5.0)
Alkaline Phosphatase: 59 U/L (ref 38–126)
Anion gap: 15 (ref 5–15)
BUN: 15 mg/dL (ref 6–20)
CO2: 23 mmol/L (ref 22–32)
Calcium: 8.4 mg/dL — ABNORMAL LOW (ref 8.9–10.3)
Chloride: 105 mmol/L (ref 98–111)
Creatinine, Ser: 0.89 mg/dL (ref 0.61–1.24)
GFR, Estimated: >60 mL/min (ref >60)
Glucose, Bld: 122 mg/dL — ABNORMAL HIGH (ref 70–99)
Potassium: 3.7 mmol/L (ref 3.5–5.1)
Sodium: 143 mmol/L (ref 135–145)
Total Bilirubin: 0.5 mg/dL (ref 0.0–1.2)
Total Protein: 6.7 g/dL (ref 6.5–8.1)

## 2024-09-24 LAB — CBC
HCT: 37.6 % — ABNORMAL LOW (ref 39.0–52.0)
Hemoglobin: 12.3 g/dL — ABNORMAL LOW (ref 13.0–17.0)
MCH: 30.8 pg (ref 26.0–34.0)
MCHC: 32.7 g/dL (ref 30.0–36.0)
MCV: 94.2 fL (ref 80.0–100.0)
Platelets: 210 K/uL (ref 150–400)
RBC: 3.99 MIL/uL — ABNORMAL LOW (ref 4.22–5.81)
RDW: 18.5 % — ABNORMAL HIGH (ref 11.5–15.5)
WBC: 4.7 K/uL (ref 4.0–10.5)
nRBC: 0 % (ref 0.0–0.2)

## 2024-09-24 LAB — CBG MONITORING, ED: Glucose-Capillary: 128 mg/dL — ABNORMAL HIGH (ref 70–99)

## 2024-09-24 LAB — PHOSPHORUS: Phosphorus: 4.4 mg/dL (ref 2.5–4.6)

## 2024-09-24 MED ORDER — LORAZEPAM 1 MG PO TABS
1.0000 mg | ORAL_TABLET | ORAL | Status: DC | PRN
  Administered 2024-09-24: 1 mg via ORAL
  Filled 2024-09-24: qty 1

## 2024-09-24 MED ORDER — THIAMINE MONONITRATE 100 MG PO TABS
100.0000 mg | ORAL_TABLET | Freq: Every day | ORAL | Status: DC
  Administered 2024-09-24: 100 mg via ORAL
  Filled 2024-09-24: qty 1

## 2024-09-24 MED ORDER — THIAMINE HCL 100 MG/ML IJ SOLN: 100.0000 mg | Freq: Every day | INTRAMUSCULAR | Status: DC

## 2024-09-24 MED ORDER — ADULT MULTIVITAMIN W/MINERALS CH
1.0000 | ORAL_TABLET | Freq: Every day | ORAL | Status: DC
  Administered 2024-09-24: 1 via ORAL
  Filled 2024-09-24: qty 1

## 2024-09-24 MED ORDER — FOLIC ACID 1 MG PO TABS
1.0000 mg | ORAL_TABLET | Freq: Every day | ORAL | Status: DC
  Administered 2024-09-24: 1 mg via ORAL
  Filled 2024-09-24: qty 1

## 2024-09-24 MED ORDER — ONDANSETRON 4 MG PO TBDP
4.0000 mg | ORAL_TABLET | Freq: Once | ORAL | Status: AC
  Administered 2024-09-24: 4 mg via ORAL
  Filled 2024-09-24: qty 1

## 2024-09-24 MED ORDER — OXYCODONE-ACETAMINOPHEN 5-325 MG PO TABS
1.0000 | ORAL_TABLET | Freq: Once | ORAL | Status: AC
  Administered 2024-09-24: 1 via ORAL
  Filled 2024-09-24: qty 1

## 2024-09-24 MED ORDER — LORAZEPAM 2 MG/ML IJ SOLN: 1.0000 mg | INTRAMUSCULAR | Status: DC | PRN

## 2024-09-24 NOTE — ED Provider Triage Note (Signed)
 Emergency Medicine Provider Triage Evaluation Note  Gary [name] , a 38 y.o. male  was evaluated in triage.  Pt complains of multiple complaints.  The patient is worried he is having an episode of pancreatitis.  He drinks alcohol  every day and has multiple presentations to the emergency department for alcohol  withdrawals and/for alcohol  intoxication.  He states that his last drink was today but he feels like he is going into withdrawal.  He also endorses severe epigastric pain.  Additionally, he complains of hip pain and has been evaluated multiple times with CT imaging and x-ray imaging of the hip with no evidence of acute injury.  He denies any new fall or trauma.  He also endorses SI in the setting of his presentation, endorses passive SI, denies any HI or AVH.  Review of Systems  Positive: SI, abdominal pain, nausea, anxiety Negative: Chest pain, shortness of breath  Physical Exam  BP 107/71 (BP Location: Right Arm)   Pulse (!) 112   Temp 98 F (36.7 C)   Resp 14   SpO2 97%  Gen:   Awake, no distress, mildly agitated Resp:  Normal effort MSK:   Moves extremities without difficulty  Abd:  Epigastric TTP, guarding  Medical Decision Making  Medically screening exam initiated at 7:33 PM.  Appropriate orders placed.  Gary [name] was informed that the remainder of the evaluation will be completed by another provider, this initial triage assessment does not replace that evaluation, and the importance of remaining in the ED until their evaluation is complete.  Plan for workup for alcohol  withdrawal, workup for gastritis versus pancreatitis.  Patient here voluntarily for evaluation, do not see immediate indication for IVC in the setting of passive SI.   Jerrol Agent, MD 09/24/24 [phone]

## 2024-09-24 NOTE — ED Triage Notes (Signed)
 Patient here from outside of [employer] for eval of n/v and ETOH withdrawals. Stating he's having an anxiety attack in triage.

## 2024-09-24 NOTE — ED Provider Notes (Signed)
 [place] EMERGENCY DEPARTMENT AT [facility] Provider Note   CSN: [id number] Arrival date & time: 09/24/24  1858     Patient presents with: Emesis   Gary [name] is a 38 y.o. male.   HPI     This is a 38 year old male who presents with abdominal pain and concerns for alcohol  withdrawal.  Patient is well-known to our emergency department.  He has 42 visits in the last 6 months.  Reports ongoing epigastric pain that is at his baseline.  Reports that he last drank alcohol  this afternoon but feels that he is in withdrawals.  He reports that he feels shaky and nauseated.  Is also complaining of chronic hip pain.  Has been seen and evaluated multiple times and has had x-rays that have been negative.  Currently he does not have a place to live.  He was seen and evaluated 2 days ago.  He reports that he did not get his Librium  filled at that time.  Prior to Admission medications   Medication Sig Start Date End Date Taking? Authorizing Provider  bictegravir-emtricitabine -tenofovir  AF (BIKTARVY ) 50-200-25 MG TABS tablet Take 1 tablet by mouth daily. 09/21/24   Garrick Charleston, MD  chlordiazePOXIDE  (LIBRIUM ) 25 MG capsule Take 2 capsules by mouth three times daily for 1 day, 1-2 caps twice daily for 1 day, then 1-2 caps daily for 1 day. 09/21/24   Garrick Charleston, MD  gabapentin  (NEURONTIN ) 100 MG capsule Take 1 capsule (100 mg total) by mouth at bedtime. 09/21/24   Garrick Charleston, MD  levETIRAcetam  (KEPPRA ) 500 MG tablet Take 1 tablet (500 mg total) by mouth 2 (two) times daily. 09/21/24   Garrick Charleston, MD  lipase/protease/amylase (CREON ) 36000 UNITS CPEP capsule Take 1 capsule (36,000 Units total) by mouth 3 (three) times daily before meals. Patient not taking: Reported on 09/06/2024 05/26/24   Amin, Sumayya, MD  sulfamethoxazole -trimethoprim  (BACTRIM  DS) 800-160 MG tablet Take 1 tablet by mouth every Monday, Wednesday, and Friday. 09/11/24 12/10/24  Dezii, Alexandra, DO   famotidine  (PEPCID ) 20 MG tablet Take 1 tablet (20 mg total) by mouth 2 (two) times daily. Patient not taking: Reported on 06/01/2019 05/14/19 06/02/19  [name], MD    Allergies: Risperidone  and paliperidone, Tegretol  [carbamazepine ], and Caffeine    Review of Systems  Constitutional:  Negative for fever.  Respiratory:  Negative for shortness of breath.   Cardiovascular:  Negative for chest pain.  Gastrointestinal:  Positive for abdominal pain and nausea.  All other systems reviewed and are negative.   Updated Vital Signs BP 107/71 (BP Location: Right Arm)   Pulse (!) 112   Temp 98 F (36.7 C)   Resp 14   SpO2 97%   Physical Exam Vitals and nursing note reviewed.  Constitutional:      Appearance: He is well-developed.     Comments: Chronically ill-appearing, appears older than stated age  HENT:     Head: Normocephalic and atraumatic.     Mouth/Throat:     Mouth: Mucous membranes are dry.  Eyes:     Pupils: Pupils are equal, round, and reactive to light.  Cardiovascular:     Rate and Rhythm: Normal rate and regular rhythm.  Pulmonary:     Effort: Pulmonary effort is normal. No respiratory distress.  Abdominal:     Palpations: Abdomen is soft.     Tenderness: There is abdominal tenderness.  Musculoskeletal:     Cervical back: Neck supple.  Lymphadenopathy:     Cervical: No  cervical adenopathy.  Skin:    General: Skin is warm and dry.  Neurological:     Mental Status: He is alert and oriented to person, place, and time.     Comments: Slight tremor noted     (all labs ordered are listed, but only abnormal results are displayed) Labs Reviewed  COMPREHENSIVE METABOLIC PANEL WITH GFR - Abnormal; Notable for the following components:      Result Value   Glucose, Bld 122 (*)    Calcium  8.4 (*)    AST 59 (*)    All other components within normal limits  CBC - Abnormal; Notable for the following components:   RBC 3.99 (*)    Hemoglobin 12.3 (*)    HCT 37.6  (*)    RDW 18.5 (*)    All other components within normal limits  CBG MONITORING, ED - Abnormal; Notable for the following components:   Glucose-Capillary 128 (*)    All other components within normal limits  MAGNESIUM   PHOSPHORUS  LIPASE, BLOOD  ETHANOL    EKG: EKG Interpretation Date/Time:  Sunday September 24 2024 19:59:31 EST Ventricular Rate:  102 PR Interval:  134 QRS Duration:  92 QT Interval:  342 QTC Calculation: 445 R Axis:   70  Text Interpretation: Sinus tachycardia Possible Anteroseptal infarct , age undetermined Abnormal ECG When compared with ECG of 06-Sep-2024 08:06, PREVIOUS ECG IS PRESENT Confirmed by Bari Pfeiffer ([id number]) on 09/24/2024 11:25:22 PM  Radiology: No results found.   Procedures   Medications Ordered in the ED  LORazepam  (ATIVAN ) tablet 1-4 mg (1 mg Oral Given 09/24/24 2205)    Or  LORazepam  (ATIVAN ) injection 1-4 mg ( Intravenous See Alternative 09/24/24 2205)  thiamine  (VITAMIN B1) tablet 100 mg (100 mg Oral Given 09/24/24 2204)    Or  thiamine  (VITAMIN B1) injection 100 mg ( Intravenous See Alternative 09/24/24 2204)  folic acid  (FOLVITE ) tablet 1 mg (1 mg Oral Given 09/24/24 2204)  multivitamin with minerals tablet 1 tablet (1 tablet Oral Given 09/24/24 2205)  ondansetron  (ZOFRAN -ODT) disintegrating tablet 4 mg (4 mg Oral Given 09/24/24 2205)  oxyCODONE -acetaminophen  (PERCOCET/ROXICET) 5-325 MG per tablet 1 tablet (1 tablet Oral Given 09/24/24 2205)                                    Medical Decision Making Amount and/or Complexity of Data Reviewed Labs: ordered.   This patient presents to the ED for concern of alcohol  withdrawal, abdominal pain, this involves an extensive number of treatment options, and is a complaint that carries with it a high risk of complications and morbidity.  I considered the following differential and admission for this acute, potentially life threatening condition.  The differential diagnosis includes  alcohol  intoxication, withdrawal, gastritis, gastroenteritis, chronic pancreatitis  MDM:    This is a 38 year old male who presents with concern for alcohol  withdrawal.  Endorses having drank alcohol  earlier this afternoon.  He has a slight tremor but otherwise appears at his baseline.  He is well-known to myself.  Vital signs notable for mild tachycardia.  Appears based on the triage provider note that he had endorsed some passive SI; however, he denies this to me.  He appears at his baseline from a physical exam standpoint.  Suspect that he is more likely intoxicated than he is withdrawing.  CIWA score 1.  Labs reviewed and largely reassuring at the patient's baseline.  Lipase is  43.  Again I had a discussion with the patient that his ongoing chronic issues are related to his alcohol  abuse and misuse.  He has previously and multiple times been provided with resources and Librium  but is also limited in capacity to use these as he is homeless.  Patient was again provided resources.  At this time he does not appear to have an acute emergent process.  He has had innumerable CT scans and I do not feel like he needs emergent imaging.  (Labs, imaging, consults)  Labs: I Ordered, and personally interpreted labs.  The pertinent results include: CBC, CMP, lipase, magnesium , phosphate  Imaging Studies ordered: I ordered imaging studies including none I independently visualized and interpreted imaging. I agree with the radiologist interpretation  Additional history obtained from chart review.  External records from outside source obtained and reviewed including prior evaluations  Cardiac Monitoring: The patient was not maintained on a cardiac monitor.  If on the cardiac monitor, I personally viewed and interpreted the cardiac monitored which showed an underlying rhythm of: N/A  Reevaluation: After the interventions noted above, I reevaluated the patient and found that they have :stayed the same  Social  Determinants of Health:  homeless  Disposition: Discharge  Co morbidities that complicate the patient evaluation  Past Medical History:  Diagnosis Date   Alcohol  abuse    Alcohol -induced pancreatitis 04/16/2022   Anxiety    Bipolar 2 disorder (HCC)    HIV (human immunodeficiency virus infection) (HCC)    Pancreatitis    PTSD (post-traumatic stress disorder)    Schizophrenia (HCC)    Seizures (HCC)    Subdural hematoma (HCC)      Medicines Meds ordered this encounter  Medications   OR Linked Order Group    LORazepam  (ATIVAN ) tablet 1-4 mg     CIWA-AR < 5 =:   0 mg     CIWA-AR 5 -10 =:   1 mg     CIWA-AR 11 -15 =:   2 mg     CIWA-AR 16 -20 =:   3 mg     CIWA-AR 16 -20 =:   Recheck CIWA-AR in 1 hour; if > 20 notify MD     CIWA-AR > 20 =:   4 mg     CIWA-AR > 20 =:   Call Rapid Response    LORazepam  (ATIVAN ) injection 1-4 mg     CIWA-AR < 5 =:   0 mg     CIWA-AR 5 -10 =:   1 mg     CIWA-AR 11 -15 =:   2 mg     CIWA-AR 16 -20 =:   3 mg     CIWA-AR 16 -20 =:   Recheck CIWA-AR in 1 hour; if > 20 notify MD     CIWA-AR > 20 =:   4 mg     CIWA-AR > 20 =:   Call Rapid Response   OR Linked Order Group    thiamine  (VITAMIN B1) tablet 100 mg    thiamine  (VITAMIN B1) injection 100 mg   folic acid  (FOLVITE ) tablet 1 mg   multivitamin with minerals tablet 1 tablet   ondansetron  (ZOFRAN -ODT) disintegrating tablet 4 mg   oxyCODONE -acetaminophen  (PERCOCET/ROXICET) 5-325 MG per tablet 1 tablet    Refill:  0    I have reviewed the patients home medicines and have made adjustments as needed  Problem List / ED Course: Problem List Items Addressed This Visit   None Visit Diagnoses  Chronic alcohol  abuse    -  Primary     Chronic abdominal pain       Relevant Medications   oxyCODONE -acetaminophen  (PERCOCET/ROXICET) 5-325 MG per tablet 1 tablet (Completed)                Final diagnoses:  Chronic alcohol  abuse  Chronic abdominal pain    ED Discharge Orders      None          Bari Charmaine FALCON, MD 09/24/24 2351

## 2024-09-24 NOTE — Discharge Instructions (Signed)
 You were seen today with concerns for alcohol  withdrawal.  You used most recently this past afternoon.  You do not have any significant objective signs of alcohol  withdrawal.  You continue to struggle with alcohol  abuse and misuse.  This is causing your ongoing abdominal discomfort as well as other symptoms.  Please see resources for both detox and shelters.

## 2024-09-24 NOTE — ED Notes (Signed)
 Pt placed on stretcher for safety

## 2024-09-25 ENCOUNTER — Emergency Department (HOSPITAL_COMMUNITY)
Admission: EM | Admit: 2024-09-25 | Discharge: 2024-09-25 | Discharge status: Against Medical Advice | Payer: MEDICAID | Attending: Emergency Medicine | Admitting: Emergency Medicine

## 2024-09-25 DIAGNOSIS — Z5321 Procedure and treatment not carried out due to patient leaving prior to being seen by health care provider: Secondary | ICD-10-CM | POA: Insufficient documentation

## 2024-09-25 DIAGNOSIS — Z5900 Homelessness unspecified: Secondary | ICD-10-CM | POA: Diagnosis not present

## 2024-09-25 DIAGNOSIS — F10129 Alcohol abuse with intoxication, unspecified: Secondary | ICD-10-CM | POA: Diagnosis present

## 2024-09-25 NOTE — ED Notes (Signed)
 Pt called for vitals x3 no response.

## 2024-09-25 NOTE — ED Notes (Signed)
 Pt called for bed assignment x3 no response

## 2024-09-25 NOTE — ED Notes (Signed)
 Pt seen ambulating w/walker out of emergency department and down the street

## 2024-09-25 NOTE — ED Triage Notes (Signed)
 Pt homeless and surrounded by alcohol  bottles on scene and clearly intoxicated. [facility] said instead of sending him to jail send him here to sober up. No medical complaints.

## 2024-09-28 ENCOUNTER — Emergency Department (HOSPITAL_COMMUNITY)
Admission: EM | Admit: 2024-09-28 | Discharge: 2024-09-29 | Disposition: A | Discharge status: Home / Self Care | Payer: MEDICAID | Attending: Emergency Medicine | Admitting: Emergency Medicine

## 2024-09-28 ENCOUNTER — Emergency Department (HOSPITAL_COMMUNITY): Payer: MEDICAID

## 2024-09-28 DIAGNOSIS — Z21 Asymptomatic human immunodeficiency virus [HIV] infection status: Secondary | ICD-10-CM | POA: Insufficient documentation

## 2024-09-28 DIAGNOSIS — R7401 Elevation of levels of liver transaminase levels: Secondary | ICD-10-CM | POA: Insufficient documentation

## 2024-09-28 DIAGNOSIS — W19XXXA Unspecified fall, initial encounter: Secondary | ICD-10-CM | POA: Diagnosis not present

## 2024-09-28 DIAGNOSIS — R739 Hyperglycemia, unspecified: Secondary | ICD-10-CM

## 2024-09-28 DIAGNOSIS — M25561 Pain in right knee: Secondary | ICD-10-CM | POA: Diagnosis not present

## 2024-09-28 DIAGNOSIS — M25552 Pain in left hip: Secondary | ICD-10-CM | POA: Insufficient documentation

## 2024-09-28 DIAGNOSIS — D696 Thrombocytopenia, unspecified: Secondary | ICD-10-CM | POA: Insufficient documentation

## 2024-09-28 DIAGNOSIS — D6489 Other specified anemias: Secondary | ICD-10-CM | POA: Diagnosis not present

## 2024-09-28 DIAGNOSIS — R7309 Other abnormal glucose: Secondary | ICD-10-CM | POA: Insufficient documentation

## 2024-09-28 DIAGNOSIS — D649 Anemia, unspecified: Secondary | ICD-10-CM

## 2024-09-28 LAB — CBC
HCT: 38.5 % — ABNORMAL LOW (ref 39.0–52.0)
Hemoglobin: 12.9 g/dL — ABNORMAL LOW (ref 13.0–17.0)
MCH: 30.9 pg (ref 26.0–34.0)
MCHC: 33.5 g/dL (ref 30.0–36.0)
MCV: 92.3 fL (ref 80.0–100.0)
Platelets: 145 K/uL — ABNORMAL LOW (ref 150–400)
RBC: 4.17 MIL/uL — ABNORMAL LOW (ref 4.22–5.81)
RDW: 18.5 % — ABNORMAL HIGH (ref 11.5–15.5)
WBC: 4.1 K/uL (ref 4.0–10.5)
nRBC: 0 % (ref 0.0–0.2)

## 2024-09-28 NOTE — ED Notes (Signed)
 Per EDP Lemly MD, Pt ok to go to lobby

## 2024-09-28 NOTE — ED Notes (Addendum)
 Attempted to stick pt for labs, attempt unsucessful. RN notified. Upon being unsuccessful with labs, pt began to start becoming irritable. Pt would not remove blanket over their head while vitals being taken and labs drawn.

## 2024-09-28 NOTE — ED Triage Notes (Signed)
 [place] ED [place] [facility] [place]. Pt not cooperative with EMS, combative and verbally threatening. Pt requesting ETOH detox, last drink today, appears to be impaired.

## 2024-09-28 NOTE — ED Provider Triage Note (Signed)
 Emergency Medicine Provider Triage Evaluation Note  AMEDEO DETWEILER , a 38 y.o. male  was evaluated in triage.  Pt complains of diffuse pain.  Patient states he fell at some point.  Next he did hit his head.  No loss conscious.  Versus alcohol  intoxication.  Bilateral chronic leg pain.  No chest pain or shortness of breath.  No nausea vomit diarrhea..  Review of Systems  Positive: Leg pain Negative: Chest pain shortness of breath  Physical Exam  BP 118/83 (BP Location: Left Arm)   Pulse 97   Temp 97.8 F (36.6 C)   Resp 14   SpO2 97%  Gen:   Awake, no distress   Resp:  Normal effort  MSK:   Moves extremities without difficulty  Other:    Medical Decision Making  Medically screening exam initiated at 11:05 PM.  Appropriate orders placed.  NAITHAN DELAGE was informed that the remainder of the evaluation will be completed by another provider, this initial triage assessment does not replace that evaluation, and the importance of remaining in the ED until their evaluation is complete.  Will obtain CT head.  Basic laboratory workup.   Simon Lavonia SAILOR, MD 09/28/24 [phone]

## 2024-09-29 ENCOUNTER — Encounter (HOSPITAL_COMMUNITY): Payer: Self-pay | Admitting: Emergency Medicine

## 2024-09-29 ENCOUNTER — Encounter (HOSPITAL_COMMUNITY): Payer: Self-pay

## 2024-09-29 ENCOUNTER — Emergency Department (HOSPITAL_COMMUNITY): Payer: MEDICAID

## 2024-09-29 ENCOUNTER — Emergency Department (HOSPITAL_COMMUNITY)
Admission: EM | Admit: 2024-09-29 | Discharge: 2024-09-30 | Disposition: A | Discharge status: Home / Self Care | Payer: MEDICAID | Attending: Emergency Medicine | Admitting: Emergency Medicine

## 2024-09-29 ENCOUNTER — Other Ambulatory Visit: Payer: Self-pay

## 2024-09-29 DIAGNOSIS — Z21 Asymptomatic human immunodeficiency virus [HIV] infection status: Secondary | ICD-10-CM | POA: Insufficient documentation

## 2024-09-29 DIAGNOSIS — G8929 Other chronic pain: Secondary | ICD-10-CM

## 2024-09-29 DIAGNOSIS — F10120 Alcohol abuse with intoxication, uncomplicated: Secondary | ICD-10-CM | POA: Insufficient documentation

## 2024-09-29 DIAGNOSIS — Y908 Blood alcohol level of 240 mg/100 ml or more: Secondary | ICD-10-CM | POA: Insufficient documentation

## 2024-09-29 DIAGNOSIS — F10920 Alcohol use, unspecified with intoxication, uncomplicated: Secondary | ICD-10-CM

## 2024-09-29 LAB — CBC WITH DIFFERENTIAL/PLATELET
Abs Immature Granulocytes: 0 K/uL (ref 0.00–0.07)
Basophils Absolute: 0 K/uL (ref 0.0–0.1)
Basophils Relative: 1 %
Eosinophils Absolute: 0.1 K/uL (ref 0.0–0.5)
Eosinophils Relative: 2 %
HCT: 35.2 % — ABNORMAL LOW (ref 39.0–52.0)
Hemoglobin: 11.1 g/dL — ABNORMAL LOW (ref 13.0–17.0)
Immature Granulocytes: 0 %
Lymphocytes Relative: 55 %
Lymphs Abs: 2 K/uL (ref 0.7–4.0)
MCH: 30.5 pg (ref 26.0–34.0)
MCHC: 31.5 g/dL (ref 30.0–36.0)
MCV: 96.7 fL (ref 80.0–100.0)
Monocytes Absolute: 0.2 K/uL (ref 0.1–1.0)
Monocytes Relative: 6 %
Neutro Abs: 1.3 K/uL — ABNORMAL LOW (ref 1.7–7.7)
Neutrophils Relative %: 36 %
Platelets: 111 K/uL — ABNORMAL LOW (ref 150–400)
RBC: 3.64 MIL/uL — ABNORMAL LOW (ref 4.22–5.81)
RDW: 18.7 % — ABNORMAL HIGH (ref 11.5–15.5)
WBC: 3.7 K/uL — ABNORMAL LOW (ref 4.0–10.5)
nRBC: 0 % (ref 0.0–0.2)

## 2024-09-29 LAB — COMPREHENSIVE METABOLIC PANEL WITH GFR
ALT: 37 U/L (ref 0–44)
AST: 53 U/L — ABNORMAL HIGH (ref 15–41)
Albumin: 3.9 g/dL (ref 3.5–5.0)
Alkaline Phosphatase: 61 U/L (ref 38–126)
Anion gap: 16 — ABNORMAL HIGH (ref 5–15)
BUN: 14 mg/dL (ref 6–20)
CO2: 22 mmol/L (ref 22–32)
Calcium: 9 mg/dL (ref 8.9–10.3)
Chloride: 104 mmol/L (ref 98–111)
Creatinine, Ser: 0.87 mg/dL (ref 0.61–1.24)
GFR, Estimated: >60 mL/min (ref >60)
Glucose, Bld: 105 mg/dL — ABNORMAL HIGH (ref 70–99)
Potassium: 3.6 mmol/L (ref 3.5–5.1)
Sodium: 142 mmol/L (ref 135–145)
Total Bilirubin: <0.2 mg/dL (ref 0.0–1.2)
Total Protein: 7.3 g/dL (ref 6.5–8.1)

## 2024-09-29 LAB — BASIC METABOLIC PANEL WITH GFR
Anion gap: 14 (ref 5–15)
BUN: 8 mg/dL (ref 6–20)
CO2: 20 mmol/L — ABNORMAL LOW (ref 22–32)
Calcium: 8.4 mg/dL — ABNORMAL LOW (ref 8.9–10.3)
Chloride: 102 mmol/L (ref 98–111)
Creatinine, Ser: 0.64 mg/dL (ref 0.61–1.24)
GFR, Estimated: >60 mL/min (ref >60)
Glucose, Bld: 172 mg/dL — ABNORMAL HIGH (ref 70–99)
Potassium: 4.2 mmol/L (ref 3.5–5.1)
Sodium: 136 mmol/L (ref 135–145)

## 2024-09-29 LAB — ETHANOL: Alcohol, Ethyl (B): >450 mg/dL (ref <15)

## 2024-09-29 LAB — CK: Total CK: 242 U/L (ref 49–397)

## 2024-09-29 MED ORDER — LACTATED RINGERS IV BOLUS
1000.0000 mL | Freq: Once | INTRAVENOUS | Status: AC
  Administered 2024-09-30: 1000 mL via INTRAVENOUS

## 2024-09-29 MED ORDER — LORAZEPAM 1 MG PO TABS
2.0000 mg | ORAL_TABLET | Freq: Once | ORAL | Status: AC
  Administered 2024-09-29: 2 mg via ORAL
  Filled 2024-09-29: qty 2

## 2024-09-29 MED ORDER — THIAMINE MONONITRATE 100 MG PO TABS
100.0000 mg | ORAL_TABLET | Freq: Once | ORAL | Status: AC
  Administered 2024-09-29: 100 mg via ORAL
  Filled 2024-09-29: qty 1

## 2024-09-29 NOTE — ED Provider Notes (Signed)
 [place] EMERGENCY DEPARTMENT AT [facility] Provider Note   CSN: [id number] Arrival date & time: 09/29/24  2031     Patient presents with: Alcohol  Intoxication   Gary [name] is a 38 y.o. male.    Alcohol  Intoxication     Patient has a history of HIV alcoholism, medical noncompliance, seizures, schizophrenia.  Patient has been to the ED 44 times in the last 6 months.  Patient presents ED with complaints of alcohol  intoxication.  Patient states he was drinking again today.  He mentioned at triage wanting detox but he did not indicate this to me.  He told me he was here because he was intoxicated.  Patient states he has been having some trouble with hip pain since a fall.  However he was evaluated for this yesterday.  No changes since then  Prior to Admission medications   Medication Sig Start Date End Date Taking? Authorizing Provider  bictegravir-emtricitabine -tenofovir  AF (BIKTARVY ) 50-200-25 MG TABS tablet Take 1 tablet by mouth daily. 09/21/24   Garrick Charleston, MD  chlordiazePOXIDE  (LIBRIUM ) 25 MG capsule Take 2 capsules by mouth three times daily for 1 day, 1-2 caps twice daily for 1 day, then 1-2 caps daily for 1 day. 09/21/24   Garrick Charleston, MD  gabapentin  (NEURONTIN ) 100 MG capsule Take 1 capsule (100 mg total) by mouth at bedtime. 09/21/24   Garrick Charleston, MD  levETIRAcetam  (KEPPRA ) 500 MG tablet Take 1 tablet (500 mg total) by mouth 2 (two) times daily. 09/21/24   Garrick Charleston, MD  lipase/protease/amylase (CREON ) 36000 UNITS CPEP capsule Take 1 capsule (36,000 Units total) by mouth 3 (three) times daily before meals. Patient not taking: Reported on 09/06/2024 05/26/24   Amin, Sumayya, MD  sulfamethoxazole -trimethoprim  (BACTRIM  DS) 800-160 MG tablet Take 1 tablet by mouth every Monday, Wednesday, and Friday. 09/11/24 12/10/24  Dezii, Alexandra, DO  famotidine  (PEPCID ) 20 MG tablet Take 1 tablet (20 mg total) by mouth 2 (two) times daily. Patient not  taking: Reported on 06/01/2019 05/14/19 06/02/19  [name], MD    Allergies: Risperidone  and paliperidone, Tegretol  [carbamazepine ], and Caffeine    Review of Systems  Updated Vital Signs BP (!) 109/50   Pulse (!) 107   Temp 98.5 F (36.9 C) (Oral)   Resp 16   Ht 1.829 m (6')   Wt 66 kg   SpO2 96%   BMI 19.73 kg/m   Physical Exam Vitals and nursing note reviewed.  Constitutional:      General: He is not in acute distress.    Appearance: He is well-developed.     Comments: Disheveled  HENT:     Head: Normocephalic and atraumatic.     Right Ear: External ear normal.     Left Ear: External ear normal.  Eyes:     General: No scleral icterus.       Right eye: No discharge.        Left eye: No discharge.     Conjunctiva/sclera: Conjunctivae normal.  Neck:     Trachea: No tracheal deviation.  Cardiovascular:     Rate and Rhythm: Normal rate.  Pulmonary:     Effort: Pulmonary effort is normal. No respiratory distress.     Breath sounds: No stridor.  Abdominal:     General: There is no distension.  Musculoskeletal:        General: No swelling or deformity.     Cervical back: Neck supple.  Skin:    General: Skin is warm and  dry.     Findings: No rash.  Neurological:     General: No focal deficit present.     Mental Status: He is alert.     Cranial Nerves: No dysarthria or facial asymmetry.     Motor: No seizure activity.     Comments: Somnolent     (all labs ordered are listed, but only abnormal results are displayed) Labs Reviewed  BASIC METABOLIC PANEL WITH GFR - Abnormal; Notable for the following components:      Result Value   CO2 20 (*)    Glucose, Bld 172 (*)    Calcium  8.4 (*)    All other components within normal limits  CBC WITH DIFFERENTIAL/PLATELET - Abnormal; Notable for the following components:   WBC 3.7 (*)    RBC 3.64 (*)    Hemoglobin 11.1 (*)    HCT 35.2 (*)    RDW 18.7 (*)    Platelets 111 (*)    Neutro Abs 1.3 (*)    All other  components within normal limits  ETHANOL - Abnormal; Notable for the following components:   Alcohol , Ethyl (B) >450 (*)    All other components within normal limits    EKG: EKG Interpretation Date/Time:  Friday September 29 2024 20:55:16 EST Ventricular Rate:  115 PR Interval:  180 QRS Duration:  90 QT Interval:  300 QTC Calculation: 415 R Axis:   52  Text Interpretation: Sinus tachycardia ST & T wave abnormality, consider lateral ischemia Abnormal ECG When compared with ECG of 24-Sep-2024 19:59, No significant change since last tracing Confirmed by Randol Simmonds [phone]) on 09/29/2024 9:01:40 PM  Radiology: ARCOLA Knee Complete 4 Views Right Result Date: 09/29/2024 EXAM: 4 VIEW(S) XRAY OF THE RIGHT KNEE 09/29/2024 05:24:00 AM COMPARISON: None available. CLINICAL HISTORY: fall FINDINGS: BONES AND JOINTS: No acute fracture. No focal osseous lesion. No joint dislocation. No significant joint effusion. No significant degenerative changes. SOFT TISSUES: The soft tissues are unremarkable. IMPRESSION: 1. No acute fracture or dislocation. Electronically signed by: Waddell Calk MD 09/29/2024 05:30 AM EST RP Workstation: [id number]   DG Hip Unilat W or Wo Pelvis 2-3 Views Left Result Date: 09/29/2024 EXAM: 2 OR MORE VIEW(S) XRAY OF THE UNILATERAL HIP 09/29/2024 05:24:00 AM COMPARISON: 09/21/2024 CLINICAL HISTORY: fall FINDINGS: BONES AND JOINTS: No acute fracture or focal osseous lesion. The hip joint is maintained. No significant degenerative changes. SOFT TISSUES: The soft tissues are unremarkable. IMPRESSION: 1. No acute fracture or dislocation. Electronically signed by: Waddell Calk MD 09/29/2024 05:28 AM EST RP Workstation: [id number]   CT Head Wo Contrast Result Date: 09/28/2024 CLINICAL DATA:  Altered mental status. EXAM: CT HEAD WITHOUT CONTRAST TECHNIQUE: Contiguous axial images were obtained from the base of the skull through the vertex without intravenous contrast. RADIATION DOSE REDUCTION:  This exam was performed according to the departmental dose-optimization program which includes automated exposure control, adjustment of the mA and/or kV according to patient size and/or use of iterative reconstruction technique. COMPARISON:  August 31, 2024 FINDINGS: Brain: There is mild, age advanced cerebral atrophy with widening of the extra-axial spaces and ventricular dilatation. There are areas of decreased attenuation within the white matter tracts of the supratentorial brain, consistent with microvascular disease changes. Vascular: No hyperdense vessel or unexpected calcification. Skull: Normal. Negative for fracture or focal lesion. Sinuses/Orbits: No acute finding. Other: None. IMPRESSION: 1. No acute intracranial abnormality. 2. Generalized cerebral atrophy with widening of the extra-axial spaces and ventricular dilatation. Electronically Signed   By: Suzen  Houston M.D.   On: 09/28/2024 23:25     Procedures   Medications Ordered in the ED  lactated ringers  bolus 1,000 mL (has no administration in time range)  thiamine  (VITAMIN B1) tablet 100 mg (100 mg Oral Given 09/29/24 2215)                                    Medical Decision Making Risk OTC drugs.   Patient presents with severe alcohol  intoxication.  Unfortunately this is a recurrent issue for the patient.  His laboratory test do not show any significant metabolic abnormalities other than an extremely elevated alcohol  level.  Will give him a dose of thiamine  although he has received thiamine  and multivitamins recently.  Will give him a liter of lactated Ringer 's.  Will monitor until the patient is clinically stable for discharge.  He will be given outpatient resources  Care turned over to oncoming team at shift change.    Final diagnoses:  Alcoholic intoxication without complication    ED Discharge Orders     None          Randol Simmonds, MD 09/29/24 2235

## 2024-09-29 NOTE — ED Provider Notes (Signed)
 [place] EMERGENCY DEPARTMENT AT [facility] Provider Note   CSN: [id number] Arrival date & time: 09/28/24  2227     Patient presents with: Alcohol  Problem   Gary [name] is a 38 y.o. male.   The history is provided by the patient.  Alcohol  Problem   He has a history of bipolar disorder, alcohol  abuse, pancreatitis, HIV disease and comes in because of falls.  He states he has fallen several times.  He hit his head on 1 occasion, is also complaining of pain in his left hip and states he skinned his right knee.  He admits to ongoing alcohol  abuse, it feels like he is starting to go through withdrawal.    Prior to Admission medications   Medication Sig Start Date End Date Taking? Authorizing Provider  bictegravir-emtricitabine -tenofovir  AF (BIKTARVY ) 50-200-25 MG TABS tablet Take 1 tablet by mouth daily. 09/21/24   Garrick Charleston, MD  chlordiazePOXIDE  (LIBRIUM ) 25 MG capsule Take 2 capsules by mouth three times daily for 1 day, 1-2 caps twice daily for 1 day, then 1-2 caps daily for 1 day. 09/21/24   Garrick Charleston, MD  gabapentin  (NEURONTIN ) 100 MG capsule Take 1 capsule (100 mg total) by mouth at bedtime. 09/21/24   Garrick Charleston, MD  levETIRAcetam  (KEPPRA ) 500 MG tablet Take 1 tablet (500 mg total) by mouth 2 (two) times daily. 09/21/24   Garrick Charleston, MD  lipase/protease/amylase (CREON ) 36000 UNITS CPEP capsule Take 1 capsule (36,000 Units total) by mouth 3 (three) times daily before meals. Patient not taking: Reported on 09/06/2024 05/26/24   Caleen Qualia, MD  sulfamethoxazole -trimethoprim  (BACTRIM  DS) 800-160 MG tablet Take 1 tablet by mouth every Monday, Wednesday, and Friday. 09/11/24 12/10/24  Dezii, Alexandra, DO  famotidine  (PEPCID ) 20 MG tablet Take 1 tablet (20 mg total) by mouth 2 (two) times daily. Patient not taking: Reported on 06/01/2019 05/14/19 06/02/19  [name], MD    Allergies: Risperidone  and paliperidone, Tegretol  [carbamazepine ],  and Caffeine    Review of Systems  All other systems reviewed and are negative.   Updated Vital Signs BP 118/83 (BP Location: Left Arm)   Pulse 97   Temp 97.8 F (36.6 C)   Resp 14   SpO2 97%   Physical Exam Vitals and nursing note reviewed.   38 year old male, resting comfortably and in no acute distress. Vital signs are normal. Oxygen saturation is 97%, which is normal. Head is normocephalic and atraumatic. PERRLA, EOMI.  Neck is nontender. Lungs are clear without rales, wheezes, or rhonchi. Chest is nontender. Heart has regular rate and rhythm without murmur. Abdomen is soft, flat, nontender. Extremities: There is no swelling or deformity.  There is pain on passive range of motion of the left hip, but full passive range of motion is present.  There is mild pain on passive range of motion of the right knee but full passive range of motion is present.  There is no instability on valgus or varus stress.  Full range of motion is present all other joints without pain. Skin is warm and dry without rash. Neurologic: Awake and alert, moves all extremities equally.  (all labs ordered are listed, but only abnormal results are displayed) Labs Reviewed  COMPREHENSIVE METABOLIC PANEL WITH GFR - Abnormal; Notable for the following components:      Result Value   Glucose, Bld 105 (*)    AST 53 (*)    Anion gap 16 (*)    All other components within normal  limits  CBC - Abnormal; Notable for the following components:   RBC 4.17 (*)    Hemoglobin 12.9 (*)    HCT 38.5 (*)    RDW 18.5 (*)    Platelets 145 (*)    All other components within normal limits  CK  RAPID URINE DRUG SCREEN, HOSP PERFORMED     Radiology: CT Head Wo Contrast Result Date: 09/28/2024 CLINICAL DATA:  Altered mental status. EXAM: CT HEAD WITHOUT CONTRAST TECHNIQUE: Contiguous axial images were obtained from the base of the skull through the vertex without intravenous contrast. RADIATION DOSE REDUCTION: This exam  was performed according to the departmental dose-optimization program which includes automated exposure control, adjustment of the mA and/or kV according to patient size and/or use of iterative reconstruction technique. COMPARISON:  August 31, 2024 FINDINGS: Brain: There is mild, age advanced cerebral atrophy with widening of the extra-axial spaces and ventricular dilatation. There are areas of decreased attenuation within the white matter tracts of the supratentorial brain, consistent with microvascular disease changes. Vascular: No hyperdense vessel or unexpected calcification. Skull: Normal. Negative for fracture or focal lesion. Sinuses/Orbits: No acute finding. Other: None. IMPRESSION: 1. No acute intracranial abnormality. 2. Generalized cerebral atrophy with widening of the extra-axial spaces and ventricular dilatation. Electronically Signed   By: Suzen Dials M.D.   On: 09/28/2024 23:25     Procedures   Medications Ordered in the ED  LORazepam  (ATIVAN ) tablet 2 mg (2 mg Oral Given 09/29/24 0527)                                    Medical Decision Making Amount and/or Complexity of Data Reviewed Labs: ordered. Radiology: ordered.  Risk Prescription drug management.   Fall with head injury, injury to left hip and right knee.  I reviewed his laboratory tests, and my interpretation is borderline elevated random glucose, borderline elevated AST not felt to be clinically significant, mild anemia and thrombocytopenia unchanged from baseline, normal CK.  CT of head shows no acute intracranial injury.  Have independently viewed the images, and agree with the radiologist's interpretation.  I have ordered x-rays of left hip and right knee.  I have ordered a dose of oral lorazepam .  I have reviewed his past records, and he has multiple ED visits which are mostly related to alcohol  abuse (most recent visit on 09/24/2024), but also has ED visits for left hip pain-most recently on 09/02/2024.  He  has had 43 ED visits in the last 6 months.  X-rays show no evidence of fracture.  He is safe for discharge.     Final diagnoses:  Fall, initial encounter  Pain in left hip  Right knee pain, unspecified chronicity  Normochromic normocytic anemia  Thrombocytopenia  Elevated AST (SGOT)  Elevated random blood glucose level    ED Discharge Orders     None          Raford Lenis, MD 09/29/24 [phone]

## 2024-09-29 NOTE — ED Notes (Signed)
 Pt in xray

## 2024-09-29 NOTE — Discharge Instructions (Signed)
 Apply ice to sore areas.  Ice should be applied for 30 minutes at a time, 4 times a day.  You may take acetaminophen  and/or ibuprofen  as needed for pain.  If you combine acetaminophen  and ibuprofen , you will get better pain relief than you get from taking either medication by itself.

## 2024-09-29 NOTE — ED Triage Notes (Signed)
 Pt arrived via [facility] c/o ETOH intoxication and would like detox. Pt states that his last drink was this afternoon after being dc from ED.

## 2024-09-30 MED ORDER — ONDANSETRON 4 MG PO TBDP
4.0000 mg | ORAL_TABLET | Freq: Once | ORAL | Status: AC
  Administered 2024-09-30: 4 mg via ORAL
  Filled 2024-09-30: qty 1

## 2024-09-30 MED ORDER — OXYCODONE-ACETAMINOPHEN 5-325 MG PO TABS
1.0000 | ORAL_TABLET | Freq: Once | ORAL | Status: AC
  Administered 2024-09-30: 1 via ORAL
  Filled 2024-09-30: qty 1

## 2024-09-30 NOTE — ED Provider Notes (Signed)
 Blood pressure (!) 109/50, pulse (!) 107, temperature 98.5 F (36.9 C), temperature source Oral, resp. rate 16, height 6' (1.829 m), weight 66 kg, SpO2 96%.  Assuming care from Dr. Randol.  In short, Gary [name] is a 38 y.o. male with a chief complaint of Alcohol  Intoxication .  Refer to the original H&P for additional details.  The current plan of care is to f/u after sobering.  06:03 AM  Patient now awake and alert.  He is complaining of chronic pain in his back, knee, hand.  No new injuries.  He appears clinically sober.  I attached resources sheet to his paperwork.  He is not exhibiting signs of severe withdrawal at this time.  Stable for discharge.   Darra Fonda MATSU, MD 09/30/24 [phone]

## 2024-10-02 ENCOUNTER — Other Ambulatory Visit (HOSPITAL_COMMUNITY): Payer: Self-pay

## 2024-10-05 ENCOUNTER — Emergency Department (HOSPITAL_COMMUNITY)
Admission: EM | Admit: 2024-10-05 | Discharge: 2024-10-06 | Disposition: A | Discharge status: Home / Self Care | Payer: Self-pay | Attending: Emergency Medicine | Admitting: Emergency Medicine

## 2024-10-05 ENCOUNTER — Encounter (HOSPITAL_COMMUNITY): Payer: Self-pay

## 2024-10-05 DIAGNOSIS — R1110 Vomiting, unspecified: Secondary | ICD-10-CM | POA: Insufficient documentation

## 2024-10-05 DIAGNOSIS — Z21 Asymptomatic human immunodeficiency virus [HIV] infection status: Secondary | ICD-10-CM | POA: Insufficient documentation

## 2024-10-05 DIAGNOSIS — D696 Thrombocytopenia, unspecified: Secondary | ICD-10-CM | POA: Insufficient documentation

## 2024-10-05 DIAGNOSIS — Y907 Blood alcohol level of 200-239 mg/100 ml: Secondary | ICD-10-CM | POA: Insufficient documentation

## 2024-10-05 DIAGNOSIS — F10220 Alcohol dependence with intoxication, uncomplicated: Secondary | ICD-10-CM

## 2024-10-05 DIAGNOSIS — R7989 Other specified abnormal findings of blood chemistry: Secondary | ICD-10-CM | POA: Insufficient documentation

## 2024-10-05 DIAGNOSIS — G8929 Other chronic pain: Secondary | ICD-10-CM | POA: Insufficient documentation

## 2024-10-05 DIAGNOSIS — F10139 Alcohol abuse with withdrawal, unspecified: Secondary | ICD-10-CM | POA: Insufficient documentation

## 2024-10-05 DIAGNOSIS — R109 Unspecified abdominal pain: Secondary | ICD-10-CM | POA: Insufficient documentation

## 2024-10-05 LAB — CBC WITH DIFFERENTIAL/PLATELET
Abs Immature Granulocytes: 0.01 K/uL (ref 0.00–0.07)
Basophils Absolute: 0 K/uL (ref 0.0–0.1)
Basophils Relative: 1 %
Eosinophils Absolute: 0.1 K/uL (ref 0.0–0.5)
Eosinophils Relative: 2 %
HCT: 32.3 % — ABNORMAL LOW (ref 39.0–52.0)
Hemoglobin: 11.1 g/dL — ABNORMAL LOW (ref 13.0–17.0)
Immature Granulocytes: 0 %
Lymphocytes Relative: 34 %
Lymphs Abs: 1.4 K/uL (ref 0.7–4.0)
MCH: 31.5 pg (ref 26.0–34.0)
MCHC: 34.4 g/dL (ref 30.0–36.0)
MCV: 91.8 fL (ref 80.0–100.0)
Monocytes Absolute: 0.4 K/uL (ref 0.1–1.0)
Monocytes Relative: 10 %
Neutro Abs: 2.2 K/uL (ref 1.7–7.7)
Neutrophils Relative %: 53 %
Platelets: 69 K/uL — ABNORMAL LOW (ref 150–400)
RBC: 3.52 MIL/uL — ABNORMAL LOW (ref 4.22–5.81)
RDW: 18.2 % — ABNORMAL HIGH (ref 11.5–15.5)
WBC: 4.2 K/uL (ref 4.0–10.5)
nRBC: 0 % (ref 0.0–0.2)

## 2024-10-05 LAB — COMPREHENSIVE METABOLIC PANEL WITH GFR
ALT: 46 U/L — ABNORMAL HIGH (ref 0–44)
AST: 73 U/L — ABNORMAL HIGH (ref 15–41)
Albumin: 4.2 g/dL (ref 3.5–5.0)
Alkaline Phosphatase: 67 U/L (ref 38–126)
Anion gap: 15 (ref 5–15)
BUN: 10 mg/dL (ref 6–20)
CO2: 25 mmol/L (ref 22–32)
Calcium: 9.3 mg/dL (ref 8.9–10.3)
Chloride: 93 mmol/L — ABNORMAL LOW (ref 98–111)
Creatinine, Ser: 0.74 mg/dL (ref 0.61–1.24)
GFR, Estimated: >60 mL/min (ref >60)
Glucose, Bld: 101 mg/dL — ABNORMAL HIGH (ref 70–99)
Potassium: 3.9 mmol/L (ref 3.5–5.1)
Sodium: 133 mmol/L — ABNORMAL LOW (ref 135–145)
Total Bilirubin: 0.5 mg/dL (ref 0.0–1.2)
Total Protein: 7 g/dL (ref 6.5–8.1)

## 2024-10-05 LAB — ETHANOL: Alcohol, Ethyl (B): 202 mg/dL — ABNORMAL HIGH (ref <15)

## 2024-10-05 LAB — LIPASE, BLOOD: Lipase: 97 U/L — ABNORMAL HIGH (ref 11–51)

## 2024-10-05 MED ORDER — FOLIC ACID 1 MG PO TABS: 1.0000 mg | ORAL_TABLET | Freq: Every day | ORAL | Status: DC

## 2024-10-05 MED ORDER — LORAZEPAM 2 MG/ML IJ SOLN: 1.0000 mg | INTRAMUSCULAR | Status: DC | PRN

## 2024-10-05 MED ORDER — LORAZEPAM 2 MG/ML IJ SOLN
1.0000 mg | Freq: Once | INTRAMUSCULAR | Status: AC
  Administered 2024-10-05: 1 mg via INTRAVENOUS
  Filled 2024-10-05: qty 1

## 2024-10-05 MED ORDER — THIAMINE HCL 100 MG/ML IJ SOLN: 100.0000 mg | Freq: Every day | INTRAMUSCULAR | Status: DC

## 2024-10-05 MED ORDER — CHLORDIAZEPOXIDE HCL 25 MG PO CAPS
100.0000 mg | ORAL_CAPSULE | Freq: Once | ORAL | Status: AC
  Administered 2024-10-05: 100 mg via ORAL
  Filled 2024-10-05: qty 4

## 2024-10-05 MED ORDER — ADULT MULTIVITAMIN W/MINERALS CH: 1.0000 | ORAL_TABLET | Freq: Every day | ORAL | Status: DC

## 2024-10-05 MED ORDER — FOLIC ACID 1 MG PO TABS
1.0000 mg | ORAL_TABLET | Freq: Every day | ORAL | Status: DC
  Administered 2024-10-05: 1 mg via ORAL
  Filled 2024-10-05: qty 1

## 2024-10-05 MED ORDER — THIAMINE HCL 100 MG/ML IJ SOLN
100.0000 mg | Freq: Every day | INTRAMUSCULAR | Status: DC
  Administered 2024-10-05: 100 mg via INTRAVENOUS
  Filled 2024-10-05: qty 2

## 2024-10-05 MED ORDER — THIAMINE MONONITRATE 100 MG PO TABS: 100.0000 mg | ORAL_TABLET | Freq: Every day | ORAL | Status: DC

## 2024-10-05 MED ORDER — LORAZEPAM 2 MG/ML IJ SOLN: 0.0000 mg | Freq: Two times a day (BID) | INTRAMUSCULAR | Status: DC

## 2024-10-05 MED ORDER — LACTATED RINGERS IV SOLN: INTRAVENOUS | Status: DC

## 2024-10-05 MED ORDER — LORAZEPAM 1 MG PO TABS: 1.0000 mg | ORAL_TABLET | ORAL | Status: DC | PRN

## 2024-10-05 MED ORDER — LACTATED RINGERS IV BOLUS
1000.0000 mL | Freq: Once | INTRAVENOUS | Status: AC
  Administered 2024-10-05: 1000 mL via INTRAVENOUS

## 2024-10-05 MED ORDER — ADULT MULTIVITAMIN W/MINERALS CH
1.0000 | ORAL_TABLET | Freq: Every day | ORAL | Status: DC
  Administered 2024-10-05: 1 via ORAL
  Filled 2024-10-05: qty 1

## 2024-10-05 MED ORDER — LORAZEPAM 2 MG/ML IJ SOLN
0.0000 mg | Freq: Four times a day (QID) | INTRAMUSCULAR | Status: DC
  Administered 2024-10-05: 4 mg via INTRAVENOUS
  Filled 2024-10-05: qty 2

## 2024-10-05 NOTE — ED Provider Notes (Signed)
 Received patient in turnover from Dr. Dasie.  Please see their note for further details of Hx, PE.  Briefly patient is a 38 y.o. male with a Generalized Body Aches .  Patient to reassess.  Patient resting comfortably on my exam.  Patient mildly tachycardic, not hypotensive.  I think unlikely to be in withdrawal, with an alcohol  level greater than 200.  Will discharge when clinically able.  Tachycardia resolved. No hypertension.  Doubt withdrawal.  Clinically sober.  D/c home.  Given info for outpatient rehab.     Emil Share, DO 10/06/24 0130

## 2024-10-05 NOTE — ED Provider Notes (Signed)
 [place] EMERGENCY DEPARTMENT AT [facility] Provider Note   CSN: [id number] Arrival date & time: 10/05/24  2106     Patient presents with: Generalized Body Aches   Gary [name] is a 38 y.o. male.   38 year old male well-known to me with history of alcohol  abuse, HIV presents requesting detox from alcohol .  Patient is concerned that he may going to withdrawal.  He endorses body aches but denies any URI symptoms.  Patient states that he normally drinks 2/5 of alcohol  a day.  Notes that he had 7 beers today.  Had emesis x 1.  Also endorses chronic abdominal pain.  States he may have pancreatitis.  His emesis earlier today was nonbloody or bilious.  Is not had any black stools.  No fever or chills.  Called EMS, patient's blood sugar was 118.  And transported here       Prior to Admission medications   Medication Sig Start Date End Date Taking? Authorizing Provider  bictegravir-emtricitabine -tenofovir  AF (BIKTARVY ) 50-200-25 MG TABS tablet Take 1 tablet by mouth daily. 09/21/24   Garrick Charleston, MD  chlordiazePOXIDE  (LIBRIUM ) 25 MG capsule Take 2 capsules by mouth three times daily for 1 day, 1-2 caps twice daily for 1 day, then 1-2 caps daily for 1 day. 09/21/24   Garrick Charleston, MD  gabapentin  (NEURONTIN ) 100 MG capsule Take 1 capsule (100 mg total) by mouth at bedtime. 09/21/24   Garrick Charleston, MD  levETIRAcetam  (KEPPRA ) 500 MG tablet Take 1 tablet (500 mg total) by mouth 2 (two) times daily. 09/21/24   Garrick Charleston, MD  lipase/protease/amylase (CREON ) 36000 UNITS CPEP capsule Take 1 capsule (36,000 Units total) by mouth 3 (three) times daily before meals. Patient not taking: Reported on 09/06/2024 05/26/24   Caleen Qualia, MD  sulfamethoxazole -trimethoprim  (BACTRIM  DS) 800-160 MG tablet Take 1 tablet by mouth every Monday, Wednesday, and Friday. 09/11/24 12/10/24  Dezii, Alexandra, DO  famotidine  (PEPCID ) 20 MG tablet Take 1 tablet (20 mg total) by mouth 2 (two)  times daily. Patient not taking: Reported on 06/01/2019 05/14/19 06/02/19  [name], MD    Allergies: Risperidone  and paliperidone, Tegretol  [carbamazepine ], and Caffeine    Review of Systems  All other systems reviewed and are negative.   Updated Vital Signs There were no vitals taken for this visit.  Physical Exam Vitals and nursing note reviewed.  Constitutional:      General: He is not in acute distress.    Appearance: Normal appearance. He is well-developed. He is not toxic-appearing.  HENT:     Head: Normocephalic and atraumatic.  Eyes:     General: Lids are normal.     Conjunctiva/sclera: Conjunctivae normal.     Pupils: Pupils are equal, round, and reactive to light.  Neck:     Thyroid : No thyroid  mass.     Trachea: No tracheal deviation.  Cardiovascular:     Rate and Rhythm: Normal rate and regular rhythm.     Heart sounds: Normal heart sounds. No murmur heard.    No gallop.  Pulmonary:     Effort: Pulmonary effort is normal. No respiratory distress.     Breath sounds: Normal breath sounds. No stridor. No decreased breath sounds, wheezing, rhonchi or rales.  Abdominal:     General: There is no distension.     Palpations: Abdomen is soft.     Tenderness: There is no abdominal tenderness. There is no rebound.  Musculoskeletal:        General: No tenderness.  Normal range of motion.     Cervical back: Normal range of motion and neck supple.  Skin:    General: Skin is warm and dry.     Findings: No abrasion or rash.  Neurological:     Mental Status: He is alert and oriented to person, place, and time. Mental status is at baseline.     GCS: GCS eye subscore is 4. GCS verbal subscore is 5. GCS motor subscore is 6.     Cranial Nerves: No cranial nerve deficit.     Sensory: No sensory deficit.     Motor: Motor function is intact. No tremor.  Psychiatric:        Attention and Perception: Attention normal.        Mood and Affect: Affect is blunt.        Speech:  Speech normal.        Behavior: Behavior is withdrawn.     (all labs ordered are listed, but only abnormal results are displayed) Labs Reviewed  CBC WITH DIFFERENTIAL/PLATELET  COMPREHENSIVE METABOLIC PANEL WITH GFR  LIPASE, BLOOD  ETHANOL    EKG: None  Radiology: No results found.   Procedures   Medications Ordered in the ED  lactated ringers  bolus 1,000 mL (has no administration in time range)  lactated ringers  infusion (has no administration in time range)  LORazepam  (ATIVAN ) injection 1 mg (has no administration in time range)                                    Medical Decision Making Amount and/or Complexity of Data Reviewed Labs: ordered.  Risk OTC drugs. Prescription drug management.   Patient given fluids here.  Also given Ativan  for CIWA 23.  Alcohol  was 202.  Labs show chronic thrombocytopenia as well as some elevated LFTs likely from his alcohol  use.  Plan will be to keep on CIWA and reassess.  Signed out to Dr. Emil     Final diagnoses:  None    ED Discharge Orders     None          [name], MD 10/05/24 2306

## 2024-10-05 NOTE — ED Triage Notes (Addendum)
 [facility] (pt is homeless) d/t body aches - he thinks he may be withdrawing form alcohol .  Pt drank 7 beers today - last just before dark.  BP 154/92 HR 108 Cbg 118 98% RA O2

## 2024-10-06 ENCOUNTER — Other Ambulatory Visit: Payer: Self-pay

## 2024-10-06 ENCOUNTER — Emergency Department (HOSPITAL_COMMUNITY)
Admission: EM | Admit: 2024-10-06 | Discharge: 2024-10-07 | Disposition: A | Discharge status: Home / Self Care | Payer: Self-pay | Attending: Emergency Medicine | Admitting: Emergency Medicine

## 2024-10-06 DIAGNOSIS — F10920 Alcohol use, unspecified with intoxication, uncomplicated: Secondary | ICD-10-CM

## 2024-10-06 DIAGNOSIS — Y909 Presence of alcohol in blood, level not specified: Secondary | ICD-10-CM | POA: Insufficient documentation

## 2024-10-06 DIAGNOSIS — F10120 Alcohol abuse with intoxication, uncomplicated: Secondary | ICD-10-CM | POA: Insufficient documentation

## 2024-10-06 DIAGNOSIS — Z21 Asymptomatic human immunodeficiency virus [HIV] infection status: Secondary | ICD-10-CM | POA: Insufficient documentation

## 2024-10-06 NOTE — ED Provider Notes (Signed)
 [place] EMERGENCY DEPARTMENT AT [facility] Provider Note   CSN: [id number] Arrival date & time: 10/06/24  7668     Patient presents with: Alcohol  Problem   Gary [name] is a 38 y.o. male.  {Add pertinent medical, surgical, social history, OB history to YEP:67052} The history is provided by the patient.  Alcohol  Problem This is a chronic problem. The current episode started more than 1 week ago. The problem occurs constantly. The problem has not changed since onset.Pertinent negatives include no chest pain, no abdominal pain, no headaches and no shortness of breath. Nothing aggravates the symptoms. Nothing relieves the symptoms. He has tried nothing for the symptoms. The treatment provided no relief.  Patient with alcohol  abuse BIB EMS [place] gas station following drinking 3 bottles of wine with a bottle of strawberry margarita wine on his person.     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 Authorizing Provider  bictegravir-emtricitabine -tenofovir  AF (BIKTARVY ) 50-200-25 MG TABS tablet Take 1 tablet by mouth daily. 09/21/24   Garrick Charleston, MD  chlordiazePOXIDE  (LIBRIUM ) 25 MG capsule Take 2 capsules by mouth three times daily for 1 day, 1-2 caps twice daily for 1 day, then 1-2 caps daily for 1 day. 09/21/24   Garrick Charleston, MD  gabapentin  (NEURONTIN ) 100 MG capsule Take 1 capsule (100 mg total) by mouth at bedtime. 09/21/24   Garrick Charleston, MD  levETIRAcetam  (KEPPRA ) 500 MG tablet Take 1 tablet (500 mg total) by mouth 2 (two) times daily. 09/21/24   Garrick Charleston, MD  lipase/protease/amylase  (CREON ) 36000 UNITS CPEP capsule Take 1 capsule (36,000 Units total) by mouth 3 (three) times daily before meals. Patient not taking: Reported on 09/06/2024 05/26/24   Amin, Sumayya, MD  sulfamethoxazole -trimethoprim  (BACTRIM  DS) 800-160 MG tablet Take 1 tablet by mouth every Monday, Wednesday, and Friday. 09/11/24 12/10/24  Dezii, Alexandra, DO  famotidine  (PEPCID ) 20 MG tablet Take 1 tablet (20 mg total) by mouth 2 (two) times daily. Patient not taking: Reported on 06/01/2019 05/14/19 06/02/19  [name], MD    Allergies: Risperidone  and paliperidone, Tegretol  [carbamazepine ], and Caffeine    Review of Systems  Constitutional:  Negative for fever.  HENT:  Negative for facial swelling.   Respiratory:  Negative for shortness of breath.   Cardiovascular:  Negative for chest pain.  Gastrointestinal:  Negative for abdominal pain.  Neurological:  Negative for headaches.  All other systems reviewed and are negative.   Updated Vital Signs BP 107/72   Pulse 100   Temp 98.5 F (36.9 C)   Resp 19   SpO2 98%   Physical Exam Vitals and nursing note reviewed.  Constitutional:      General: He is not in acute distress.    Appearance: He is well-developed. He is not diaphoretic.  HENT:     Head: Normocephalic and atraumatic.     Nose: Nose normal.  Eyes:     Conjunctiva/sclera: Conjunctivae normal.     Pupils: Pupils are equal, round, and reactive to light.  Cardiovascular:     Rate and Rhythm:  Normal rate and regular rhythm.     Pulses: Normal pulses.     Heart sounds: Normal heart sounds.  Pulmonary:     Effort: Pulmonary effort is normal.     Breath sounds: Normal breath sounds. No wheezing or rales.  Abdominal:     General: Bowel sounds are normal.     Palpations: Abdomen is soft.     Tenderness: There is no abdominal tenderness. There is no guarding or rebound.  Musculoskeletal:        General: Normal range of motion.     Cervical back: Normal range of motion and neck  supple.  Skin:    General: Skin is warm and dry.     Capillary Refill: Capillary refill takes less than 2 seconds.  Neurological:     General: No focal deficit present.     Mental Status: He is alert and oriented to person, place, and time.     Deep Tendon Reflexes: Reflexes normal.     (all labs ordered are listed, but only abnormal results are displayed) Labs Reviewed - No data to display  EKG: None  Radiology: No results found.  {Document cardiac monitor, telemetry assessment procedure when appropriate:32947} Procedures   Medications Ordered in the ED - No data to display    {Click here for ABCD2, HEART and other calculators REFRESH Note before signing:1}                              Medical Decision Making   Final diagnoses:  None  No signs of systemic illness or infection. The patient is nontoxic-appearing on exam and vital signs are within normal limits.  I have reviewed the triage vital signs and the nursing notes. Pertinent labs & imaging results that were available during my care of the patient were reviewed by me and considered in my medical decision making (see chart for details). After history, exam, and medical workup I feel the patient has been appropriately medically screened and is safe for discharge home. Pertinent diagnoses were discussed with the patient. Patient was given return precautions.   ED Discharge Orders     None

## 2024-10-06 NOTE — ED Notes (Signed)
 CIWA completed based on Pt sleeping and difficult to arouse due to Benzodiazepine administration

## 2024-10-06 NOTE — ED Notes (Signed)
 ED Provider at bedside.

## 2024-10-06 NOTE — ED Triage Notes (Signed)
 Pt bib [facility] from [facility] station, pt states he is concerned he may go through withdrawals because he drank 2 bottles of wine today. Pt arrives with a bottle of alcohol .  Pt is homeless and was seen for same yesterday.   BP 128/88 HR 112 96% RA

## 2024-10-06 NOTE — ED Notes (Addendum)
 Pt reports he was planning to drink bottle of alcohol  he brought from home when staff left room, RN explained he cannot keep bottle and will need to be sent to security.

## 2024-10-07 MED ORDER — ONDANSETRON 8 MG PO TBDP
8.0000 mg | ORAL_TABLET | Freq: Once | ORAL | Status: AC
  Administered 2024-10-07: 8 mg via ORAL
  Filled 2024-10-07: qty 1

## 2024-10-09 ENCOUNTER — Other Ambulatory Visit: Payer: Self-pay

## 2024-10-09 ENCOUNTER — Emergency Department (HOSPITAL_COMMUNITY)
Admission: EM | Admit: 2024-10-09 | Discharge: 2024-10-10 | Disposition: A | Discharge status: Other Acute Inpatient Hospital | Payer: MEDICAID | Attending: Emergency Medicine | Admitting: Emergency Medicine

## 2024-10-09 DIAGNOSIS — R7401 Elevation of levels of liver transaminase levels: Secondary | ICD-10-CM | POA: Insufficient documentation

## 2024-10-09 DIAGNOSIS — F1020 Alcohol dependence, uncomplicated: Secondary | ICD-10-CM | POA: Diagnosis present

## 2024-10-09 DIAGNOSIS — R1013 Epigastric pain: Secondary | ICD-10-CM | POA: Insufficient documentation

## 2024-10-09 DIAGNOSIS — Y908 Blood alcohol level of 240 mg/100 ml or more: Secondary | ICD-10-CM | POA: Insufficient documentation

## 2024-10-09 DIAGNOSIS — R45851 Suicidal ideations: Secondary | ICD-10-CM

## 2024-10-09 DIAGNOSIS — F332 Major depressive disorder, recurrent severe without psychotic features: Secondary | ICD-10-CM | POA: Insufficient documentation

## 2024-10-09 DIAGNOSIS — F10920 Alcohol use, unspecified with intoxication, uncomplicated: Secondary | ICD-10-CM

## 2024-10-09 DIAGNOSIS — R1033 Periumbilical pain: Secondary | ICD-10-CM | POA: Insufficient documentation

## 2024-10-09 DIAGNOSIS — Z21 Asymptomatic human immunodeficiency virus [HIV] infection status: Secondary | ICD-10-CM | POA: Insufficient documentation

## 2024-10-09 DIAGNOSIS — F17210 Nicotine dependence, cigarettes, uncomplicated: Secondary | ICD-10-CM | POA: Insufficient documentation

## 2024-10-09 LAB — COMPREHENSIVE METABOLIC PANEL WITH GFR
ALT: 64 U/L — ABNORMAL HIGH (ref 0–44)
AST: 80 U/L — ABNORMAL HIGH (ref 15–41)
Albumin: 4.5 g/dL (ref 3.5–5.0)
Alkaline Phosphatase: 76 U/L (ref 38–126)
Anion gap: 14 (ref 5–15)
BUN: 9 mg/dL (ref 6–20)
CO2: 29 mmol/L (ref 22–32)
Calcium: 9.5 mg/dL (ref 8.9–10.3)
Chloride: 95 mmol/L — ABNORMAL LOW (ref 98–111)
Creatinine, Ser: 0.6 mg/dL — ABNORMAL LOW (ref 0.61–1.24)
GFR, Estimated: >60 mL/min (ref >60)
Glucose, Bld: 133 mg/dL — ABNORMAL HIGH (ref 70–99)
Potassium: 3.5 mmol/L (ref 3.5–5.1)
Sodium: 138 mmol/L (ref 135–145)
Total Bilirubin: 0.3 mg/dL (ref 0.0–1.2)
Total Protein: 7.6 g/dL (ref 6.5–8.1)

## 2024-10-09 LAB — URINE DRUG SCREEN
Amphetamines: NEGATIVE
Barbiturates: NEGATIVE
Benzodiazepines: POSITIVE — AB
Cocaine: NEGATIVE
Fentanyl: NEGATIVE
Methadone Scn, Ur: NEGATIVE
Opiates: NEGATIVE
Tetrahydrocannabinol: NEGATIVE

## 2024-10-09 LAB — CBC
HCT: 37 % — ABNORMAL LOW (ref 39.0–52.0)
Hemoglobin: 12.4 g/dL — ABNORMAL LOW (ref 13.0–17.0)
MCH: 31.4 pg (ref 26.0–34.0)
MCHC: 33.5 g/dL (ref 30.0–36.0)
MCV: 93.7 fL (ref 80.0–100.0)
Platelets: 108 K/uL — ABNORMAL LOW (ref 150–400)
RBC: 3.95 MIL/uL — ABNORMAL LOW (ref 4.22–5.81)
RDW: 19.2 % — ABNORMAL HIGH (ref 11.5–15.5)
WBC: 4 K/uL (ref 4.0–10.5)
nRBC: 0 % (ref 0.0–0.2)

## 2024-10-09 LAB — ETHANOL: Alcohol, Ethyl (B): 335 mg/dL (ref <15)

## 2024-10-09 LAB — LIPASE, BLOOD: Lipase: 45 U/L (ref 11–51)

## 2024-10-09 MED ORDER — LORAZEPAM 2 MG/ML IJ SOLN: 1.0000 mg | INTRAMUSCULAR | Status: DC | PRN

## 2024-10-09 MED ORDER — FOLIC ACID 1 MG PO TABS
1.0000 mg | ORAL_TABLET | Freq: Every day | ORAL | Status: DC
  Administered 2024-10-09 – 2024-10-10 (×2): 1 mg via ORAL
  Filled 2024-10-09 (×2): qty 1

## 2024-10-09 MED ORDER — ADULT MULTIVITAMIN W/MINERALS CH
1.0000 | ORAL_TABLET | Freq: Every day | ORAL | Status: DC
  Administered 2024-10-09: 1 via ORAL
  Filled 2024-10-09: qty 1

## 2024-10-09 MED ORDER — LORAZEPAM 1 MG PO TABS
1.0000 mg | ORAL_TABLET | ORAL | Status: DC | PRN
  Administered 2024-10-10: 0.5 mg via ORAL
  Administered 2024-10-10 (×3): 1 mg via ORAL
  Filled 2024-10-09 (×4): qty 1

## 2024-10-09 MED ORDER — THIAMINE HCL 100 MG/ML IJ SOLN
100.0000 mg | Freq: Every day | INTRAMUSCULAR | Status: DC
  Filled 2024-10-09: qty 2

## 2024-10-09 MED ORDER — THIAMINE MONONITRATE 100 MG PO TABS
100.0000 mg | ORAL_TABLET | Freq: Every day | ORAL | Status: DC
  Administered 2024-10-09 – 2024-10-10 (×2): 100 mg via ORAL
  Filled 2024-10-09 (×2): qty 1

## 2024-10-09 NOTE — ED Notes (Addendum)
 Lab called with critical, Ethanol of 335. PA notified.

## 2024-10-09 NOTE — ED Provider Notes (Signed)
 [place] EMERGENCY DEPARTMENT AT [facility] Provider Note   CSN: [id number] Arrival date & time: 10/09/24  1659     Patient presents with: detox   Gary [name] is a 38 y.o. male.  Patient with past history significant for alcohol  use disorder, polysubstance abuse, HIV, necrotizing pancreatitis presents to the emergency department with concerns of alcohol  detox.  He is requesting alcohol  detox advised alcohol  intake this morning.  States that he is also currently suicidal due to his chronic alcohol  use.  Patient is brought in by police.  He does also endorse some periumbilical and epigastric discomfort has developed over the last several days.  States he has had some vomiting but denies any diarrhea.  HPI     Prior to Admission medications   Medication Sig Start Date End Date Taking? Authorizing Provider  bictegravir-emtricitabine -tenofovir  AF (BIKTARVY ) 50-200-25 MG TABS tablet Take 1 tablet by mouth daily. 09/21/24   Garrick Charleston, MD  chlordiazePOXIDE  (LIBRIUM ) 25 MG capsule Take 2 capsules by mouth three times daily for 1 day, 1-2 caps twice daily for 1 day, then 1-2 caps daily for 1 day. 09/21/24   Garrick Charleston, MD  gabapentin  (NEURONTIN ) 100 MG capsule Take 1 capsule (100 mg total) by mouth at bedtime. 09/21/24   Garrick Charleston, MD  levETIRAcetam  (KEPPRA ) 500 MG tablet Take 1 tablet (500 mg total) by mouth 2 (two) times daily. 09/21/24   Garrick Charleston, MD  lipase/protease/amylase (CREON ) 36000 UNITS CPEP capsule Take 1 capsule (36,000 Units total) by mouth 3 (three) times daily before meals. Patient not taking: Reported on 09/06/2024 05/26/24   Caleen Qualia, MD  sulfamethoxazole -trimethoprim  (BACTRIM  DS) 800-160 MG tablet Take 1 tablet by mouth every Monday, Wednesday, and Friday. 09/11/24 12/10/24  Dezii, Alexandra, DO  famotidine  (PEPCID ) 20 MG tablet Take 1 tablet (20 mg total) by mouth 2 (two) times daily. Patient not taking: Reported on 06/01/2019 05/14/19  06/02/19  [name], MD    Allergies: Risperidone  and paliperidone, Tegretol  [carbamazepine ], and Caffeine    Review of Systems  Gastrointestinal:  Positive for abdominal pain.  Neurological:  Positive for tremors.  All other systems reviewed and are negative.   Updated Vital Signs BP (!) 120/96 (BP Location: Left Arm)   Pulse 95   Temp 98.3 F (36.8 C)   Resp 16   SpO2 99%   Physical Exam Vitals and nursing note reviewed.  Constitutional:      General: He is not in acute distress.    Appearance: He is well-developed.  HENT:     Head: Normocephalic and atraumatic.  Eyes:     Conjunctiva/sclera: Conjunctivae normal.  Cardiovascular:     Rate and Rhythm: Normal rate and regular rhythm.     Heart sounds: No murmur heard. Pulmonary:     Effort: Pulmonary effort is normal. No respiratory distress.     Breath sounds: Normal breath sounds.  Abdominal:     Palpations: Abdomen is soft.     Tenderness: There is abdominal tenderness in the epigastric area. There is guarding.  Musculoskeletal:        General: No swelling.     Cervical back: Neck supple.  Skin:    General: Skin is warm and dry.     Capillary Refill: Capillary refill takes less than 2 seconds.     Coloration: Skin is not jaundiced.     Findings: No bruising.  Neurological:     Mental Status: He is alert.  Psychiatric:  Mood and Affect: Mood normal.     (all labs ordered are listed, but only abnormal results are displayed) Labs Reviewed  COMPREHENSIVE METABOLIC PANEL WITH GFR - Abnormal; Notable for the following components:      Result Value   Chloride 95 (*)    Glucose, Bld 133 (*)    Creatinine, Ser 0.60 (*)    AST 80 (*)    ALT 64 (*)    All other components within normal limits  ETHANOL - Abnormal; Notable for the following components:   Alcohol , Ethyl (B) 335 (*)    All other components within normal limits  CBC - Abnormal; Notable for the following components:   RBC 3.95 (*)     Hemoglobin 12.4 (*)    HCT 37.0 (*)    RDW 19.2 (*)    Platelets 108 (*)    All other components within normal limits  URINE DRUG SCREEN - Abnormal; Notable for the following components:   Benzodiazepines POSITIVE (*)    All other components within normal limits  LIPASE, BLOOD    EKG: None  Radiology: No results found.   Procedures   Medications Ordered in the ED  LORazepam  (ATIVAN ) tablet 1-4 mg (has no administration in time range)    Or  LORazepam  (ATIVAN ) injection 1-4 mg (has no administration in time range)  thiamine  (VITAMIN B1) tablet 100 mg (100 mg Oral Given 10/09/24 1814)    Or  thiamine  (VITAMIN B1) injection 100 mg ( Intravenous See Alternative 10/09/24 1814)  folic acid  (FOLVITE ) tablet 1 mg (1 mg Oral Given 10/09/24 1814)  multivitamin with minerals tablet 1 tablet (1 tablet Oral Given 10/09/24 1817)                                    Medical Decision Making Amount and/or Complexity of Data Reviewed Labs: ordered.  Risk OTC drugs. Prescription drug management.   This patient presents to the ED for concern of alcohol  use.  Differential diagnosis includes alcohol  intoxication, withdrawal, dehydration    Additional history obtained:  Additional history obtained from chart review   Lab Tests:  I Ordered, and personally interpreted labs.  The pertinent results include: CBC is unremarkable at baseline, CMP without evidence of AKI but some transaminitis is noted, ethanol elevated at 335, UDS positive for benzodiazepines, lipase unremarkable at 45   Problem List / ED Course:  Patient well-known to the emergency department presents to the emergency department with concerns of alcohol  use.  States that he is been drinking heavily for extended period of time and cannot recall when his latest binge started.  States last alcohol  use was this morning.  He was brought in by police department today and states that he is requesting detox.  Patient did endorse  suicidal ideation but appears that this is a common occurrence for him. On exam, patient is nontremulous and nondiaphoretic.  Otherwise well-appearing.  He is acting or acting appropriately. Workup shows ethanol level elevated at 335 suspect likely acute alcohol  intoxication.  Labs otherwise unremarkable.  Will require some time to metabolize. Has not had any indication of withdrawal while here in the ED and has been resting comfortably.  10:13 PM Care of [facility] transferred to [facility] and Dr. Nettie at the end of my shift as the patient will require reassessment once labs/imaging have resulted. Patient presentation, ED course, and plan of care discussed with  review of all pertinent labs and imaging. Please see his/her note for further details regarding further ED course and disposition. Plan at time of handoff is MTF. Discharge with outpatient resources for detox. This may be altered or completely changed at the discretion of the oncoming team pending results of further workup.    Social Determinants of Health:  None  Final diagnoses:  Alcoholic intoxication without complication    ED Discharge Orders     None          Cecily Legrand DELENA DEVONNA 10/09/24 2213    Randol Simmonds, MD 10/10/24 [phone]

## 2024-10-09 NOTE — Progress Notes (Signed)
 Received consult for substance use resources. CSW has attached resources to the patient's AVS. ICM sign-off.

## 2024-10-09 NOTE — ED Triage Notes (Signed)
 Patient arrives requesting detox, states he is suicidal.  PD brought him in.  Patient presents alert/oriented.

## 2024-10-09 NOTE — ED Notes (Signed)
 Patient given sandwich and drink.  Patient is cooperative at the present time.

## 2024-10-09 NOTE — ED Notes (Signed)
 Will input vitals when the patient is dressed out and back to bed.

## 2024-10-09 NOTE — ED Notes (Signed)
 Patient has been wanded out by security prior to sending patient into bathroom to be changed to scrubs.  PD is at bedside with patient.

## 2024-10-09 NOTE — ED Notes (Signed)
 Pt is ready for TTS whenever, We will have to move him to a room for about 10-15 minutes for TTS.

## 2024-10-09 NOTE — BH Assessment (Signed)
 Patient was deferred to [facility] for a telepsych assessment. The assigned care coordinator will provide updates regarding the scheduling of the assessment. [employer] coordinator can be reached at [phone] for further information on the timing of the telepsych evaluation.

## 2024-10-10 ENCOUNTER — Ambulatory Visit (HOSPITAL_COMMUNITY)
Admission: EM | Admit: 2024-10-10 | Discharge: 2024-10-11 | Disposition: A | Source: Intra-hospital | Attending: Psychiatry | Admitting: Psychiatry

## 2024-10-10 DIAGNOSIS — F1020 Alcohol dependence, uncomplicated: Secondary | ICD-10-CM

## 2024-10-10 DIAGNOSIS — F332 Major depressive disorder, recurrent severe without psychotic features: Secondary | ICD-10-CM

## 2024-10-10 DIAGNOSIS — F1994 Other psychoactive substance use, unspecified with psychoactive substance-induced mood disorder: Secondary | ICD-10-CM

## 2024-10-10 DIAGNOSIS — F1010 Alcohol abuse, uncomplicated: Secondary | ICD-10-CM

## 2024-10-10 DIAGNOSIS — Z5900 Homelessness unspecified: Secondary | ICD-10-CM

## 2024-10-10 DIAGNOSIS — F4310 Post-traumatic stress disorder, unspecified: Secondary | ICD-10-CM

## 2024-10-10 MED ORDER — MAGNESIUM SULFATE 2 GM/50ML IV SOLN
2.0000 g | Freq: Once | INTRAVENOUS | Status: AC
  Administered 2024-10-10: 2 g via INTRAVENOUS
  Filled 2024-10-10: qty 50

## 2024-10-10 MED ORDER — HYDROXYZINE HCL 25 MG PO TABS: 25.0000 mg | ORAL_TABLET | Freq: Four times a day (QID) | ORAL | Status: DC | PRN

## 2024-10-10 MED ORDER — CHLORDIAZEPOXIDE HCL 25 MG PO CAPS
25.0000 mg | ORAL_CAPSULE | Freq: Four times a day (QID) | ORAL | Status: DC | PRN
  Administered 2024-10-10: 25 mg via ORAL
  Filled 2024-10-10: qty 1

## 2024-10-10 MED ORDER — MAGNESIUM HYDROXIDE 400 MG/5ML PO SUSP: 30.0000 mL | Freq: Every day | ORAL | Status: DC | PRN

## 2024-10-10 MED ORDER — HALOPERIDOL 5 MG PO TABS: 5.0000 mg | ORAL_TABLET | Freq: Three times a day (TID) | ORAL | Status: DC | PRN

## 2024-10-10 MED ORDER — LORAZEPAM 2 MG/ML IJ SOLN: 2.0000 mg | Freq: Three times a day (TID) | INTRAMUSCULAR | Status: DC | PRN

## 2024-10-10 MED ORDER — ALUM & MAG HYDROXIDE-SIMETH 200-200-20 MG/5ML PO SUSP: 30.0000 mL | ORAL | Status: DC | PRN

## 2024-10-10 MED ORDER — BICTEGRAVIR-EMTRICITAB-TENOFOV 50-200-25 MG PO TABS
1.0000 | ORAL_TABLET | Freq: Every day | ORAL | Status: DC
  Administered 2024-10-10: 1 via ORAL
  Filled 2024-10-10: qty 1

## 2024-10-10 MED ORDER — HALOPERIDOL LACTATE 5 MG/ML IJ SOLN: 10.0000 mg | Freq: Three times a day (TID) | INTRAMUSCULAR | Status: DC | PRN

## 2024-10-10 MED ORDER — SULFAMETHOXAZOLE-TRIMETHOPRIM 800-160 MG PO TABS: 1.0000 | ORAL_TABLET | ORAL | Status: DC

## 2024-10-10 MED ORDER — HALOPERIDOL LACTATE 5 MG/ML IJ SOLN: 5.0000 mg | Freq: Three times a day (TID) | INTRAMUSCULAR | Status: DC | PRN

## 2024-10-10 MED ORDER — PANCRELIPASE (LIP-PROT-AMYL) 36000-114000 UNITS PO CPEP
36000.0000 [IU] | ORAL_CAPSULE | Freq: Three times a day (TID) | ORAL | Status: DC
  Administered 2024-10-10 (×2): 36000 [IU] via ORAL
  Filled 2024-10-10 (×2): qty 1

## 2024-10-10 MED ORDER — HALOPERIDOL LACTATE 5 MG/ML IJ SOLN
2.0000 mg | Freq: Once | INTRAMUSCULAR | Status: AC
  Administered 2024-10-10: 2 mg via INTRAMUSCULAR
  Filled 2024-10-10: qty 1

## 2024-10-10 MED ORDER — DIPHENHYDRAMINE HCL 50 MG/ML IJ SOLN: 50.0000 mg | Freq: Three times a day (TID) | INTRAMUSCULAR | Status: DC | PRN

## 2024-10-10 MED ORDER — THIAMINE HCL 100 MG/ML IJ SOLN
100.0000 mg | Freq: Once | INTRAMUSCULAR | Status: AC
  Administered 2024-10-10: 100 mg via INTRAMUSCULAR
  Filled 2024-10-10: qty 2

## 2024-10-10 MED ORDER — ONDANSETRON 4 MG PO TBDP
4.0000 mg | ORAL_TABLET | Freq: Once | ORAL | Status: AC
  Administered 2024-10-10: 4 mg via ORAL
  Filled 2024-10-10: qty 1

## 2024-10-10 MED ORDER — LOPERAMIDE HCL 2 MG PO CAPS: 2.0000 mg | ORAL_CAPSULE | ORAL | Status: DC | PRN

## 2024-10-10 MED ORDER — LEVETIRACETAM 500 MG PO TABS
500.0000 mg | ORAL_TABLET | Freq: Two times a day (BID) | ORAL | Status: DC
  Administered 2024-10-10: 500 mg via ORAL
  Filled 2024-10-10: qty 1

## 2024-10-10 MED ORDER — LORAZEPAM 1 MG PO TABS
1.0000 mg | ORAL_TABLET | Freq: Once | ORAL | Status: AC
  Administered 2024-10-10: 1 mg via ORAL
  Filled 2024-10-10: qty 1

## 2024-10-10 MED ORDER — ONDANSETRON 4 MG PO TBDP: 4.0000 mg | ORAL_TABLET | Freq: Four times a day (QID) | ORAL | Status: DC | PRN

## 2024-10-10 MED ORDER — ACETAMINOPHEN 325 MG PO TABS: 650.0000 mg | ORAL_TABLET | Freq: Four times a day (QID) | ORAL | Status: DC | PRN

## 2024-10-10 MED ORDER — ADULT MULTIVITAMIN W/MINERALS CH
1.0000 | ORAL_TABLET | Freq: Every day | ORAL | Status: DC
  Administered 2024-10-10 – 2024-10-11 (×2): 1 via ORAL
  Filled 2024-10-10 (×2): qty 1

## 2024-10-10 MED ORDER — DIPHENHYDRAMINE HCL 50 MG PO CAPS: 50.0000 mg | ORAL_CAPSULE | Freq: Three times a day (TID) | ORAL | Status: DC | PRN

## 2024-10-10 MED ORDER — LACTATED RINGERS IV BOLUS
1000.0000 mL | Freq: Once | INTRAVENOUS | Status: AC
  Administered 2024-10-10: 1000 mL via INTRAVENOUS

## 2024-10-10 NOTE — ED Notes (Signed)
 Patient has been alert and cooperative.  Medication compliant.

## 2024-10-10 NOTE — ED Notes (Signed)
 [facility] spoke with pt regarding his disposition or being recommended for inpatient and if he would be willing to go to  [facility] for detox and follow up residential treatment. Pt reports a history of two stints of alcohol  treatment, 1 within the last year (14 days) and the other a few years ago. Pt agrees that he would benefit from residential treatment after detox at [facility]. Pt reports no other substance use. Pt reports currently having withdrawal symptoms.   Chesley Holt, [facility]  10/10/24

## 2024-10-10 NOTE — ED Notes (Signed)
 IV medications delayed due to needing IV team.

## 2024-10-10 NOTE — ED Provider Notes (Signed)
 [facility] Continuous Assessment Admission H&P  Date: 10/11/24 Patient Name: Gary [name] MRN: [id number] Chief Complaint: alcohol  abuse  Diagnoses:  Final diagnoses:  Alcohol  abuse  Homelessness unspecified    HPI: Gary [name], 38 y/o male with a history of homelessness, alcohol  abuse, malingering behavior, presented to [facility], as a transfer from Gary [name], ED.SABRA  According to patient he is trying to get detox from alcohol  per the patient he used 2 fifth of vodka on a daily basis.  Patient is also currently homeless.  Not seeing a psychiatrist or therapist.  Review of patient records show multiple ED visits for alcohol  abuse.   Consult note: Recommendations: Medication recommendations: Patient has not been on any psychotropic medications (scheduled) recently, and I do not recommend that any scheduled meds be begun in the ED.  Patient is on a CIWA protocol, and for now I would recommend treating any anxiety with the protocol. Non-Medication/therapeutic recommendations: Patient is having suicidal ideation with plan, so recommend that he continue under close observation, as per ED protocol, until he can be safely admitted either to Psychiatry or to Medicine, with inhouse psychiatric consultation Is inpatient psychiatric hospitalization recommended for this patient? Yes (Explain why): As above, patient is acutely suicidal because of major depression, and he meets criteria for admission.  He is willing to come into the hospital voluntarily. Is another care setting recommended for this patient? (examples may include Crisis Stabilization Unit, Residential/Recovery Treatment, ALF/SNF, Memory Care Unit)  No (Explain why): As above From a psychiatric perspective, is this patient appropriate for discharge to an outpatient setting/resource or other less restrictive environment for continued care?  No (Explain why): As above Follow-Up Telepsychiatry C/L services: We will sign off for now. Please  re-consult our service if needed for any concerning changes in the patient's condition, discharge planning, or questions. Communication: Treatment team members (and family members if applicable) who were involved in treatment/care discussions and planning, and with whom we spoke or engaged with via secure text/chat, include the following: Secure message sent to Gary [name], ED provider, and staff, outlining recommendations    Face-to-face evaluation of patient, patient is alert and oriented x 4, speech is clear, maintaining minimal eye contact.  Patient does not appear to be in any acute distress at this present moment.  Patient denies SI, HI, AVH or paranoia.  Reports consuming 2/5 of vodka alcohol  on a daily basis to him he has been drinking for the past 10 years.  Patient is also currently homeless.  Patient denies illicit drug use denied access to guns.  Given patient prior history and current presentation recommend, observation unit  Total Time spent with patient: 30 minutes  Musculoskeletal  Strength & Muscle Tone: within normal limits Gait & Station: normal Patient leans: N/A  Psychiatric Specialty Exam  Presentation General Appearance:  Appropriate for Environment  Eye Contact: Fair  Speech: Clear and Coherent  Speech Volume: Normal  Handedness: Right   Mood and Affect  Mood: Anxious  Affect: Congruent   Thought Process  Thought Processes: Coherent  Descriptions of Associations:Intact  Orientation:Full (Time, Place and Person)  Thought Content:WDL  Diagnosis of Schizophrenia or Schizoaffective disorder in past: No data recorded Duration of Psychotic Symptoms: No data recorded Hallucinations:Hallucinations: None  Ideas of Reference:None  Suicidal Thoughts:Suicidal Thoughts: No  Homicidal Thoughts:Homicidal Thoughts: No   Sensorium  Memory: Immediate Fair  Judgment: Poor  Insight: Poor   Executive Functions   Concentration: Fair  Attention Span: Fair  Recall: Dotti Abe of Knowledge: Fair  Language: Fair   Psychomotor Activity  Psychomotor Activity: Psychomotor Activity: Normal   Assets  Assets: Desire for Improvement   Sleep  Sleep: Sleep: Fair Number of Hours of Sleep: 8   Nutritional Assessment (For OBS and FBC admissions only) Has the patient had a weight loss or gain of 10 pounds or more in the last 3 months?: No Has the patient had a decrease in food intake/or appetite?: No Does the patient have dental problems?: No Does the patient have eating habits or behaviors that may be indicators of an eating disorder including binging or inducing vomiting?: No Has the patient recently lost weight without trying?: 0 Has the patient been eating poorly because of a decreased appetite?: 0 Malnutrition Screening Tool Score: 0    Physical Exam HENT:     Head: Normocephalic.     Nose: Nose normal.  Eyes:     Pupils: Pupils are equal, round, and reactive to light.  Cardiovascular:     Rate and Rhythm: Normal rate.  Pulmonary:     Effort: Pulmonary effort is normal.  Musculoskeletal:        General: Normal range of motion.     Cervical back: Normal range of motion.  Neurological:     General: No focal deficit present.     Mental Status: He is alert.  Psychiatric:        Mood and Affect: Mood normal.        Behavior: Behavior normal.        Thought Content: Thought content normal.        Judgment: Judgment normal.    Review of Systems  Constitutional: Negative.   HENT: Negative.    Eyes: Negative.   Respiratory: Negative.    Cardiovascular: Negative.   Gastrointestinal: Negative.   Genitourinary: Negative.   Musculoskeletal: Negative.   Skin: Negative.   Neurological: Negative.   Psychiatric/Behavioral:  Positive for substance abuse. The patient is nervous/anxious.     Blood pressure 110/74, pulse 98, temperature 99 F (37.2 C), temperature source  Oral, resp. rate 16, SpO2 98%. There is no height or weight on file to calculate BMI.  Past Psychiatric History: Alcohol  abuse, homelessness, malingering,  Is the patient at risk to self? No  Has the patient been a risk to self in the past 6 months? No .    Has the patient been a risk to self within the distant past? No   Is the patient a risk to others? No   Has the patient been a risk to others in the past 6 months? No   Has the patient been a risk to others within the distant past? No   Past Medical History: See chart  Family History: Unknown  Social History: Alcohol   Last Labs:  Admission on 10/09/2024, Discharged on 10/10/2024  Component Date Value Ref Range Status   Sodium 10/09/2024 138  135 - 145 mmol/L Final   Potassium 10/09/2024 3.5  3.5 - 5.1 mmol/L Final   Chloride 10/09/2024 95 (L)  98 - 111 mmol/L Final   CO2 10/09/2024 29  22 - 32 mmol/L Final   Glucose, Bld 10/09/2024 133 (H)  70 - 99 mg/dL Final   Glucose reference range applies only to samples taken after fasting for at least 8 hours.   BUN 10/09/2024 9  6 - 20 mg/dL Final   Creatinine, Ser 10/09/2024 0.60 (L)  0.61 - 1.24 mg/dL Final   Calcium   10/09/2024 9.5  8.9 - 10.3 mg/dL Final   Total Protein 87/98/7974 7.6  6.5 - 8.1 g/dL Final   Albumin 87/98/7974 4.5  3.5 - 5.0 g/dL Final   AST 87/98/7974 80 (H)  15 - 41 U/L Final   ALT 10/09/2024 64 (H)  0 - 44 U/L Final   Alkaline Phosphatase 10/09/2024 76  38 - 126 U/L Final   Total Bilirubin 10/09/2024 0.3  0.0 - 1.2 mg/dL Final   GFR, Estimated 10/09/2024 >60  >60 mL/min Final   Comment: (NOTE) Calculated using the CKD-EPI Creatinine Equation (2021)    Anion gap 10/09/2024 14  5 - 15 Final   Performed at [facility], [address], [place], [place] [address]   Alcohol , Ethyl (B) 10/09/2024 335 (HH)  <15 mg/dL Final   Comment: Critical Value, Read Back and verified with RONAL FRIES, RN 10/09/24 1915 J.COLE (NOTE) For medical purposes  only. Performed at [facility], [address], [place], [place] [address]    WBC 10/09/2024 4.0  4.0 - 10.5 K/uL Final   RBC 10/09/2024 3.95 (L)  4.22 - 5.81 MIL/uL Final   Hemoglobin 10/09/2024 12.4 (L)  13.0 - 17.0 g/dL Final   HCT 87/98/7974 37.0 (L)  39.0 - 52.0 % Final   MCV 10/09/2024 93.7  80.0 - 100.0 fL Final   MCH 10/09/2024 31.4  26.0 - 34.0 pg Final   MCHC 10/09/2024 33.5  30.0 - 36.0 g/dL Final   RDW 87/98/7974 19.2 (H)  11.5 - 15.5 % Final   Platelets 10/09/2024 108 (L)  150 - 400 K/uL Final   nRBC 10/09/2024 0.0  0.0 - 0.2 % Final   Performed at [facility], [address] [place], [place] [address]   Opiates 10/09/2024 NEGATIVE  NEGATIVE Final   Cocaine 10/09/2024 NEGATIVE  NEGATIVE Final   Benzodiazepines 10/09/2024 POSITIVE (A)  NEGATIVE Final   Amphetamines 10/09/2024 NEGATIVE  NEGATIVE Final   Tetrahydrocannabinol 10/09/2024 NEGATIVE  NEGATIVE Final   Barbiturates 10/09/2024 NEGATIVE  NEGATIVE Final   Methadone Scn, Ur 10/09/2024 NEGATIVE  NEGATIVE Final   Fentanyl  10/09/2024 NEGATIVE  NEGATIVE Final   Comment: (NOTE) Drug screen is for Medical Purposes only. Positive results are preliminary only. If confirmation is needed, notify lab within 5 days.  Drug Class                 Cutoff (ng/mL) Amphetamine and metabolites 1000 Barbiturate and metabolites 200 Benzodiazepine              200 Opiates and metabolites     300 Cocaine and metabolites     300 THC                         50 Fentanyl                     5 Methadone                   300  Trazodone  is metabolized in vivo to several metabolites,  including pharmacologically active m-CPP, which is excreted in the  urine.  Immunoassay screens for amphetamines and MDMA have potential  cross-reactivity with these compounds and may provide false positive  result.  Performed at [facility], [address], [place], [place] [address]    Lipase  10/09/2024 45  11 - 51 U/L Final   Performed at [facility], [address], [place], [place] [address]  Admission on 10/05/2024, Discharged on 10/06/2024  Component Date Value Ref Range Status   WBC 10/05/2024 4.2  4.0 - 10.5 K/uL Final   RBC 10/05/2024 3.52 (L)  4.22 - 5.81 MIL/uL Final   Hemoglobin 10/05/2024 11.1 (L)  13.0 - 17.0 g/dL Final   HCT 88/72/7974 32.3 (L)  39.0 - 52.0 % Final   MCV 10/05/2024 91.8  80.0 - 100.0 fL Final   MCH 10/05/2024 31.5  26.0 - 34.0 pg Final   MCHC 10/05/2024 34.4  30.0 - 36.0 g/dL Final   RDW 88/72/7974 18.2 (H)  11.5 - 15.5 % Final   Platelets 10/05/2024 69 (L)  150 - 400 K/uL Final   Comment: REPEATED TO VERIFY SPECIMEN CHECKED FOR CLOTS PLATELET COUNT CONFIRMED BY SMEAR Immature Platelet Fraction may be clinically indicated, consider ordering this additional test [id number]    nRBC 10/05/2024 0.0  0.0 - 0.2 % Final   Neutrophils Relative % 10/05/2024 53  % Final   Neutro Abs 10/05/2024 2.2  1.7 - 7.7 K/uL Final   Lymphocytes Relative 10/05/2024 34  % Final   Lymphs Abs 10/05/2024 1.4  0.7 - 4.0 K/uL Final   Monocytes Relative 10/05/2024 10  % Final   Monocytes Absolute 10/05/2024 0.4  0.1 - 1.0 K/uL Final   Eosinophils Relative 10/05/2024 2  % Final   Eosinophils Absolute 10/05/2024 0.1  0.0 - 0.5 K/uL Final   Basophils Relative 10/05/2024 1  % Final   Basophils Absolute 10/05/2024 0.0  0.0 - 0.1 K/uL Final   Immature Granulocytes 10/05/2024 0  % Final   Abs Immature Granulocytes 10/05/2024 0.01  0.00 - 0.07 K/uL Final   Performed at [facility], [address], [place], [place] [address]   Sodium 10/05/2024 133 (L)  135 - 145 mmol/L Final   Potassium 10/05/2024 3.9  3.5 - 5.1 mmol/L Final   Chloride 10/05/2024 93 (L)  98 - 111 mmol/L Final   CO2 10/05/2024 25  22 - 32 mmol/L Final   Glucose, Bld 10/05/2024 101 (H)  70 - 99 mg/dL Final   Glucose reference range applies only to samples taken after  fasting for at least 8 hours.   BUN 10/05/2024 10  6 - 20 mg/dL Final   Creatinine, Ser 10/05/2024 0.74  0.61 - 1.24 mg/dL Final   Calcium  10/05/2024 9.3  8.9 - 10.3 mg/dL Final   Total Protein 88/72/7974 7.0  6.5 - 8.1 g/dL Final   Albumin 88/72/7974 4.2  3.5 - 5.0 g/dL Final   AST 88/72/7974 73 (H)  15 - 41 U/L Final   ALT 10/05/2024 46 (H)  0 - 44 U/L Final   Alkaline Phosphatase 10/05/2024 67  38 - 126 U/L Final   Total Bilirubin 10/05/2024 0.5  0.0 - 1.2 mg/dL Final   GFR, Estimated 10/05/2024 >60  >60 mL/min Final   Comment: (NOTE) Calculated using the CKD-EPI Creatinine Equation (2021)    Anion gap 10/05/2024 15  5 - 15 Final   Performed at [facility], [address], [place], [place] [address]   Lipase 10/05/2024 97 (H)  11 - 51 U/L Final   Performed at [facility], [address], [place], [place] [address]   Alcohol , Ethyl (B) 10/05/2024 202 (H)  <15 mg/dL Final   Comment: (NOTE) For medical purposes only. Performed at [facility], [address], [place], [place] [address]  Admission on 09/29/2024, Discharged on 09/30/2024  Component Date Value Ref Range Status   Sodium 09/29/2024 136  135 - 145 mmol/L Final   Potassium 09/29/2024 4.2  3.5 - 5.1 mmol/L Final   Chloride 09/29/2024 102  98 - 111 mmol/L Final   CO2 09/29/2024 20 (L)  22 - 32 mmol/L Final   Glucose, Bld 09/29/2024 172 (H)  70 - 99 mg/dL Final   Glucose reference range applies only to samples taken after fasting for at least 8 hours.   BUN 09/29/2024 8  6 - 20 mg/dL Final   Creatinine, Ser 09/29/2024 0.64  0.61 - 1.24 mg/dL Final   Calcium  09/29/2024 8.4 (L)  8.9 - 10.3 mg/dL Final   GFR, Estimated 09/29/2024 >60  >60 mL/min Final   Comment: (NOTE) Calculated using the CKD-EPI Creatinine Equation (2021)    Anion gap 09/29/2024 14  5 - 15 Final   Performed at [facility], [address], [place], [place] [address]   WBC 09/29/2024 3.7  (L)  4.0 - 10.5 K/uL Final   RBC 09/29/2024 3.64 (L)  4.22 - 5.81 MIL/uL Final   Hemoglobin 09/29/2024 11.1 (L)  13.0 - 17.0 g/dL Final   HCT 88/78/7974 35.2 (L)  39.0 - 52.0 % Final   MCV 09/29/2024 96.7  80.0 - 100.0 fL Final   MCH 09/29/2024 30.5  26.0 - 34.0 pg Final   MCHC 09/29/2024 31.5  30.0 - 36.0 g/dL Final   RDW 88/78/7974 18.7 (H)  11.5 - 15.5 % Final   Platelets 09/29/2024 111 (L)  150 - 400 K/uL Final   nRBC 09/29/2024 0.0  0.0 - 0.2 % Final   Neutrophils Relative % 09/29/2024 36  % Final   Neutro Abs 09/29/2024 1.3 (L)  1.7 - 7.7 K/uL Final   Lymphocytes Relative 09/29/2024 55  % Final   Lymphs Abs 09/29/2024 2.0  0.7 - 4.0 K/uL Final   Monocytes Relative 09/29/2024 6  % Final   Monocytes Absolute 09/29/2024 0.2  0.1 - 1.0 K/uL Final   Eosinophils Relative 09/29/2024 2  % Final   Eosinophils Absolute 09/29/2024 0.1  0.0 - 0.5 K/uL Final   Basophils Relative 09/29/2024 1  % Final   Basophils Absolute 09/29/2024 0.0  0.0 - 0.1 K/uL Final   Immature Granulocytes 09/29/2024 0  % Final   Abs Immature Granulocytes 09/29/2024 0.00  0.00 - 0.07 K/uL Final   Performed at [facility], [address] [place], [place] [address]   Alcohol , Ethyl (B) 09/29/2024 >450 (HH)  <15 mg/dL Final   Comment: REPEATED TO VERIFY CRITICAL RESULT CALLED TO, READ BACK BY AND VERIFIED WITH K.[facility] RN [id number] 09/29/2024 BY G.GANADEN (NOTE) For medical purposes only. Performed at [facility], [address] [place], [place] [address]   Admission on 09/28/2024, Discharged on 09/29/2024  Component Date Value Ref Range Status   Sodium 09/28/2024 142  135 - 145 mmol/L Final   Potassium 09/28/2024 3.6  3.5 - 5.1 mmol/L Final   Chloride 09/28/2024 104  98 - 111 mmol/L Final   CO2 09/28/2024 22  22 - 32 mmol/L Final   Glucose, Bld 09/28/2024 105 (H)  70 - 99 mg/dL Final   Glucose reference range applies only to samples taken after fasting for at least 8 hours.   BUN 09/28/2024 14  6 -  20 mg/dL Final   Creatinine, Ser 09/28/2024 0.87  0.61 - 1.24 mg/dL Final   Calcium   09/28/2024 9.0  8.9 - 10.3 mg/dL Final   Total Protein 88/79/7974 7.3  6.5 - 8.1 g/dL Final   Albumin 88/79/7974 3.9  3.5 - 5.0 g/dL Final   AST 88/79/7974 53 (H)  15 - 41 U/L Final   ALT 09/28/2024 37  0 - 44 U/L Final   Alkaline Phosphatase 09/28/2024 61  38 - 126 U/L Final   Total Bilirubin 09/28/2024 <0.2  0.0 - 1.2 mg/dL Final   GFR, Estimated 09/28/2024 >60  >60 mL/min Final   Comment: (NOTE) Calculated using the CKD-EPI Creatinine Equation (2021)    Anion gap 09/28/2024 16 (H)  5 - 15 Final   Comment: ELECTROLYTES REPEATED TO VERIFY Performed at [facility], [address], [place], [place] [address]    WBC 09/28/2024 4.1  4.0 - 10.5 K/uL Final   RBC 09/28/2024 4.17 (L)  4.22 - 5.81 MIL/uL Final   Hemoglobin 09/28/2024 12.9 (L)  13.0 - 17.0 g/dL Final   HCT 88/79/7974 38.5 (L)  39.0 - 52.0 % Final   MCV 09/28/2024 92.3  80.0 - 100.0 fL Final   MCH 09/28/2024 30.9  26.0 - 34.0 pg Final   MCHC 09/28/2024 33.5  30.0 - 36.0 g/dL Final   RDW 88/79/7974 18.5 (H)  11.5 - 15.5 % Final   Platelets 09/28/2024 145 (L)  150 - 400 K/uL Final   nRBC 09/28/2024 0.0  0.0 - 0.2 % Final   Performed at [facility], [address] [place], [place] [address]   Total CK 09/28/2024 242  49 - 397 U/L Final   Performed at [facility], [address], [place], [place] [address]  Admission on 09/24/2024, Discharged on 09/25/2024  Component Date Value Ref Range Status   Sodium 09/24/2024 143  135 - 145 mmol/L Final   Potassium 09/24/2024 3.7  3.5 - 5.1 mmol/L Final   Chloride 09/24/2024 105  98 - 111 mmol/L Final   CO2 09/24/2024 23  22 - 32 mmol/L Final   Glucose, Bld 09/24/2024 122 (H)  70 - 99 mg/dL Final   Glucose reference range applies only to samples taken after fasting for at least 8 hours.   BUN 09/24/2024 15  6 - 20 mg/dL Final   Creatinine, Ser 09/24/2024 0.89  0.61 - 1.24 mg/dL  Final   Calcium  09/24/2024 8.4 (L)  8.9 - 10.3 mg/dL Final   Total Protein 88/83/7974 6.7  6.5 - 8.1 g/dL Final   Albumin 88/83/7974 3.6  3.5 - 5.0 g/dL Final   AST 88/83/7974 59 (H)  15 - 41 U/L Final   ALT 09/24/2024 42  0 - 44 U/L Final   Alkaline Phosphatase 09/24/2024 59  38 - 126 U/L Final   Total Bilirubin 09/24/2024 0.5  0.0 - 1.2 mg/dL Final   GFR, Estimated 09/24/2024 >60  >60 mL/min Final   Comment: (NOTE) Calculated using the CKD-EPI Creatinine Equation (2021)    Anion gap 09/24/2024 15  5 - 15 Final   Performed at [facility], [address], [place], [place] [address]   Magnesium  09/24/2024 1.9  1.7 - 2.4 mg/dL Final   Performed at [facility], [address] [place], [place] [address]   Phosphorus 09/24/2024 4.4  2.5 - 4.6 mg/dL Final   Performed at [facility], [address], [place], [place] [address]   WBC 09/24/2024 4.7  4.0 - 10.5 K/uL Final   RBC 09/24/2024 3.99 (  L)  4.22 - 5.81 MIL/uL Final   Hemoglobin 09/24/2024 12.3 (L)  13.0 - 17.0 g/dL Final   HCT 88/83/7974 37.6 (L)  39.0 - 52.0 % Final   MCV 09/24/2024 94.2  80.0 - 100.0 fL Final   MCH 09/24/2024 30.8  26.0 - 34.0 pg Final   MCHC 09/24/2024 32.7  30.0 - 36.0 g/dL Final   RDW 88/83/7974 18.5 (H)  11.5 - 15.5 % Final   Platelets 09/24/2024 210  150 - 400 K/uL Final   nRBC 09/24/2024 0.0  0.0 - 0.2 % Final   Performed at [facility], [address], [place], [place] [address]   Lipase 09/24/2024 43  11 - 51 U/L Final   Performed at [facility], [address], [place], [place] [address]   Glucose-Capillary 09/24/2024 128 (H)  70 - 99 mg/dL Final   Glucose reference range applies only to samples taken after fasting for at least 8 hours.  Admission on 09/06/2024, Discharged on 09/11/2024  Component Date Value Ref Range Status   WBC 09/06/2024 3.1 (L)  4.0 - 10.5 K/uL Final   RBC 09/06/2024 3.95 (L)  4.22 - 5.81 MIL/uL Final   Hemoglobin 09/06/2024 12.2 (L)  13.0 - 17.0  g/dL Final   HCT 89/70/7974 36.0 (L)  39.0 - 52.0 % Final   MCV 09/06/2024 91.1  80.0 - 100.0 fL Final   MCH 09/06/2024 30.9  26.0 - 34.0 pg Final   MCHC 09/06/2024 33.9  30.0 - 36.0 g/dL Final   RDW 89/70/7974 19.1 (H)  11.5 - 15.5 % Final   Platelets 09/06/2024 90 (L)  150 - 400 K/uL Final   Comment: CONSISTENT WITH PREVIOUS RESULT Immature Platelet Fraction may be clinically indicated, consider ordering this additional test [id number]    nRBC 09/06/2024 0.0  0.0 - 0.2 % Final   Neutrophils Relative % 09/06/2024 59  % Final   Neutro Abs 09/06/2024 1.9  1.7 - 7.7 K/uL Final   Lymphocytes Relative 09/06/2024 24  % Final   Lymphs Abs 09/06/2024 0.8  0.7 - 4.0 K/uL Final   Monocytes Relative 09/06/2024 13  % Final   Monocytes Absolute 09/06/2024 0.4  0.1 - 1.0 K/uL Final   Eosinophils Relative 09/06/2024 2  % Final   Eosinophils Absolute 09/06/2024 0.1  0.0 - 0.5 K/uL Final   Basophils Relative 09/06/2024 2  % Final   Basophils Absolute 09/06/2024 0.1  0.0 - 0.1 K/uL Final   Immature Granulocytes 09/06/2024 0  % Final   Abs Immature Granulocytes 09/06/2024 0.01  0.00 - 0.07 K/uL Final   Performed at [facility], [address] [place], [place] [address]   Sodium 09/06/2024 137  135 - 145 mmol/L Final   ELECTROLYTES REPEATED TO VERIFY   Potassium 09/06/2024 4.2  3.5 - 5.1 mmol/L Final   Chloride 09/06/2024 96 (L)  98 - 111 mmol/L Final   CO2 09/06/2024 20 (L)  22 - 32 mmol/L Final   Glucose, Bld 09/06/2024 68 (L)  70 - 99 mg/dL Final   Glucose reference range applies only to samples taken after fasting for at least 8 hours.   BUN 09/06/2024 11  6 - 20 mg/dL Final   Creatinine, Ser 09/06/2024 0.46 (L)  0.61 - 1.24 mg/dL Final   Calcium  09/06/2024 8.6 (L)  8.9 - 10.3 mg/dL Final   Total Protein 89/70/7974 7.9  6.5 - 8.1 g/dL Final   Albumin 89/70/7974 4.0  3.5 -  5.0 g/dL Final   AST 89/70/7974 69 (H)  15 - 41 U/L Final   ALT 09/06/2024 33  0 - 44 U/L Final   Alkaline  Phosphatase 09/06/2024 76  38 - 126 U/L Final   Total Bilirubin 09/06/2024 0.9  0.0 - 1.2 mg/dL Final   GFR, Estimated 09/06/2024 >60  >60 mL/min Final   Comment: (NOTE) Calculated using the CKD-EPI Creatinine Equation (2021)    Anion gap 09/06/2024 21 (H)  5 - 15 Final   Performed at [facility], [address] [place], [place] [address]   Alcohol , Ethyl (B) 09/06/2024 66 (H)  <15 mg/dL Final   Comment: (NOTE) For medical purposes only. Performed at [facility], [address] [place], [place] [address]    Tricyclic, Ur Screen 09/06/2024 NONE DETECTED  NONE DETECTED Final   Amphetamines, Ur Screen 09/06/2024 NONE DETECTED  NONE DETECTED Final   MDMA (Ecstasy)Ur Screen 09/06/2024 NONE DETECTED  NONE DETECTED Final   Cocaine Metabolite,Ur [place] 09/06/2024 NONE DETECTED  NONE DETECTED Final   Opiate, Ur Screen 09/06/2024 NONE DETECTED  NONE DETECTED Final   Phencyclidine (PCP) Ur S 09/06/2024 NONE DETECTED  NONE DETECTED Final   Cannabinoid 50 Ng, Ur [place] 09/06/2024 NONE DETECTED  NONE DETECTED Final   Barbiturates, Ur Screen 09/06/2024 POSITIVE (A)  NONE DETECTED Final   Benzodiazepine, Ur Scrn 09/06/2024 POSITIVE (A)  NONE DETECTED Final   Methadone Scn, Ur 09/06/2024 NONE DETECTED  NONE DETECTED Final   Comment: (NOTE) Tricyclics + metabolites, urine    Cutoff 1000 ng/mL Amphetamines + metabolites, urine  Cutoff 1000 ng/mL MDMA (Ecstasy), urine              Cutoff 500 ng/mL Cocaine Metabolite, urine          Cutoff 300 ng/mL Opiate + metabolites, urine        Cutoff 300 ng/mL Phencyclidine (PCP), urine         Cutoff 25 ng/mL Cannabinoid, urine                 Cutoff 50 ng/mL Barbiturates + metabolites, urine  Cutoff 200 ng/mL Benzodiazepine, urine              Cutoff 200 ng/mL Methadone, urine                   Cutoff 300 ng/mL  The urine drug screen provides only a preliminary, unconfirmed analytical test result and should not be used for  non-medical purposes. Clinical consideration and professional judgment should be applied to any positive drug screen result due to possible interfering substances. A more specific alternate chemical method must be used in order to obtain a confirmed analytical result. Gas chromatography / mass spectrometry (GC/MS) is the preferred confirm                          atory method. Performed at [facility], [address] [place], [place] [address]    Glucose-Capillary 09/06/2024 154 (H)  70 - 99 mg/dL Final   Glucose reference range applies only to samples taken after fasting for at least 8 hours.   Levetiracetam  Lvl 09/06/2024 <2.0 (L)  10.0 - 40.0 ug/mL Final   Comment: (NOTE) Performed At: [facility] [address] [place], [place] [id number] Jennette Shorter MD Ey:[id number]    Absolute CD 4 Helper 09/06/2024 165 (L)  359 - 1,519 /uL Final   % CD 4 Pos.  Lymph. 09/06/2024 41.2  30.8 - 58.5 % Final   CD3+CD8+ Cells # Bld 09/06/2024 184  109 - 897 /uL Final   CD3+CD8+ Cells NFr Bld 09/06/2024 45.9 (H)  12.0 - 35.5 % Final   CD3+CD4+ Cells/CD3+CD8+ Cells Bld 09/06/2024 0.90 (L)  0.92 - 3.72 Final   WBC 09/06/2024 2.8 (L)  3.4 - 10.8 x10E3/uL Final   RBC 09/06/2024 3.40 (L)  4.14 - 5.80 x10E6/uL Final   Hemoglobin 09/06/2024 10.4 (L)  13.0 - 17.7 g/dL Final   Hematocrit 89/70/7974 31.5 (L)  37.5 - 51.0 % Final   MCV 09/06/2024 93  79 - 97 fL Final   MCH 09/06/2024 30.6  26.6 - 33.0 pg Final   MCHC 09/06/2024 33.0  31.5 - 35.7 g/dL Final   RDW 89/70/7974 17.9 (H)  11.6 - 15.4 % Final   Platelets 09/06/2024 71 (L)  150 - 450 x10E3/uL Final   Comment: (NOTE) Actual platelet count may be somewhat higher than reported due to aggregation of platelets in this sample.    Neutrophils 09/06/2024 75  Not Estab. % Final   Lymphs 09/06/2024 15  Not Estab. % Final   Monocytes 09/06/2024 8  Not Estab. % Final   Eos 09/06/2024 1  Not Estab. % Final   Basos 09/06/2024 1  Not  Estab. % Final   Neutrophils Absolute 09/06/2024 2.1  1.4 - 7.0 x10E3/uL Final   Lymphocytes Absolute 09/06/2024 0.4 (L)  0.7 - 3.1 x10E3/uL Final   Monocytes Absolute 09/06/2024 0.2  0.1 - 0.9 x10E3/uL Final   EOS (ABSOLUTE) 09/06/2024 0.0  0.0 - 0.4 x10E3/uL Final   Basophils Absolute 09/06/2024 0.0  0.0 - 0.2 x10E3/uL Final   Immature Granulocytes 09/06/2024 0  Not Estab. % Final   Immature Grans (Abs) 09/06/2024 0.0  0.0 - 0.1 x10E3/uL Final   Hematology Comments: 09/06/2024 Note:   Corrected   Comment: (NOTE) CBC met reflex criteria for review of peripheral smear by medical laboratory professional. Automated results were confirmed by smear review. Performed At: [facility] [address] [place], [place] [id number] Jennette Shorter MD Ey:[id number]    Prothrombin Time 09/06/2024 13.4  11.4 - 15.2 seconds Final   INR 09/06/2024 1.0  0.8 - 1.2 Final   Comment: (NOTE) INR goal varies based on device and disease states. Performed at [facility], [address] [place], [place] [address]    WBC 09/07/2024 3.4 (L)  4.0 - 10.5 K/uL Final   RBC 09/07/2024 3.58 (L)  4.22 - 5.81 MIL/uL Final   Hemoglobin 09/07/2024 10.8 (L)  13.0 - 17.0 g/dL Final   HCT 89/69/7974 33.4 (L)  39.0 - 52.0 % Final   MCV 09/07/2024 93.3  80.0 - 100.0 fL Final   MCH 09/07/2024 30.2  26.0 - 34.0 pg Final   MCHC 09/07/2024 32.3  30.0 - 36.0 g/dL Final   RDW 89/69/7974 18.6 (H)  11.5 - 15.5 % Final   Platelets 09/07/2024 63 (L)  150 - 400 K/uL Final   Comment: PLATELET COUNT CONFIRMED BY SMEAR REPEATED TO VERIFY Immature Platelet Fraction may be clinically indicated, consider ordering this additional test [id number]    nRBC 09/07/2024 0.0  0.0 - 0.2 % Final   Performed at [facility], [address] [place], [place] [address]   Sodium 09/07/2024 134 (L)  135 - 145 mmol/L Final   Potassium 09/07/2024 3.4 (L)  3.5 - 5.1 mmol/L Final   Chloride 09/07/2024 99  98 - 111  mmol/L  Final   CO2 09/07/2024 23  22 - 32 mmol/L Final   Glucose, Bld 09/07/2024 84  70 - 99 mg/dL Final   Glucose reference range applies only to samples taken after fasting for at least 8 hours.   BUN 09/07/2024 6  6 - 20 mg/dL Final   Creatinine, Ser 09/07/2024 0.45 (L)  0.61 - 1.24 mg/dL Final   Calcium  09/07/2024 8.8 (L)  8.9 - 10.3 mg/dL Final   Total Protein 89/69/7974 6.8  6.5 - 8.1 g/dL Final   Albumin 89/69/7974 3.4 (L)  3.5 - 5.0 g/dL Final   AST 89/69/7974 54 (H)  15 - 41 U/L Final   ALT 09/07/2024 27  0 - 44 U/L Final   Alkaline Phosphatase 09/07/2024 72  38 - 126 U/L Final   Total Bilirubin 09/07/2024 1.3 (H)  0.0 - 1.2 mg/dL Final   GFR, Estimated 09/07/2024 >60  >60 mL/min Final   Comment: (NOTE) Calculated using the CKD-EPI Creatinine Equation (2021)    Anion gap 09/07/2024 12  5 - 15 Final   Performed at [facility], [address] [place], [place] [address]  Admission on 09/05/2024, Discharged on 09/05/2024  Component Date Value Ref Range Status   Sodium 09/05/2024 141  135 - 145 mmol/L Final   ELECTROLYTES REPEATED TO VERIFY JG   Potassium 09/05/2024 4.3  3.5 - 5.1 mmol/L Final   HEMOLYSIS AT THIS LEVEL MAY AFFECT RESULT   Chloride 09/05/2024 100  98 - 111 mmol/L Final   CO2 09/05/2024 18 (L)  22 - 32 mmol/L Final   Glucose, Bld 09/05/2024 74  70 - 99 mg/dL Final   Glucose reference range applies only to samples taken after fasting for at least 8 hours.   BUN 09/05/2024 13  6 - 20 mg/dL Final   Creatinine, Ser 09/05/2024 0.49 (L)  0.61 - 1.24 mg/dL Final   Calcium  09/05/2024 8.7 (L)  8.9 - 10.3 mg/dL Final   Total Protein 89/71/7974 8.3 (H)  6.5 - 8.1 g/dL Final   Albumin 89/71/7974 4.3  3.5 - 5.0 g/dL Final   AST 89/71/7974 75 (H)  15 - 41 U/L Final   HEMOLYSIS AT THIS LEVEL MAY AFFECT RESULT   ALT 09/05/2024 39  0 - 44 U/L Final   HEMOLYSIS AT THIS LEVEL MAY AFFECT RESULT   Alkaline Phosphatase 09/05/2024 97  38 - 126 U/L Final   Total Bilirubin  09/05/2024 1.2  0.0 - 1.2 mg/dL Final   HEMOLYSIS AT THIS LEVEL MAY AFFECT RESULT   GFR, Estimated 09/05/2024 >60  >60 mL/min Final   Comment: (NOTE) Calculated using the CKD-EPI Creatinine Equation (2021)    Anion gap 09/05/2024 23 (H)  5 - 15 Final   Performed at [facility], [address] [place], [place] [address]   Alcohol , Ethyl (B) 09/05/2024 326 (HH)  <15 mg/dL Final   Comment: CRITICAL RESULT CALLED TO, READ BACK BY AND VERIFIED WITH  STACEY HOPPER AT 2135 09/05/24 JG (NOTE) For medical purposes only. Performed at [facility], [address] [place], [place] [address]    Magnesium  09/05/2024 2.0  1.7 - 2.4 mg/dL Final   Performed at [facility], [address] [place], [place] [address]   WBC 09/05/2024 3.2 (L)  4.0 - 10.5 K/uL Final   RBC 09/05/2024 4.33  4.22 - 5.81 MIL/uL Final   Hemoglobin 09/05/2024 13.3  13.0 - 17.0 g/dL Final   HCT 89/71/7974 40.0  39.0 - 52.0 % Final   MCV 09/05/2024 92.4  80.0 - 100.0 fL Final   MCH 09/05/2024 30.7  26.0 - 34.0 pg Final   MCHC 09/05/2024 33.3  30.0 - 36.0 g/dL Final   RDW 89/71/7974 19.0 (H)  11.5 - 15.5 % Final   Platelets 09/05/2024 88 (L)  150 - 400 K/uL Final   Comment: Immature Platelet Fraction may be clinically indicated, consider ordering this additional test [id number]    nRBC 09/05/2024 0.0  0.0 - 0.2 % Final   Neutrophils Relative % 09/05/2024 83  % Final   Neutro Abs 09/05/2024 2.6  1.7 - 7.7 K/uL Final   Lymphocytes Relative 09/05/2024 13  % Final   Lymphs Abs 09/05/2024 0.4 (L)  0.7 - 4.0 K/uL Final   Monocytes Relative 09/05/2024 3  % Final   Monocytes Absolute 09/05/2024 0.1  0.1 - 1.0 K/uL Final   Eosinophils Relative 09/05/2024 0  % Final   Eosinophils Absolute 09/05/2024 0.0  0.0 - 0.5 K/uL Final   Basophils Relative 09/05/2024 1  % Final   Basophils Absolute 09/05/2024 0.0  0.0 - 0.1 K/uL Final   WBC Morphology 09/05/2024 MORPHOLOGY UNREMARKABLE   Final   RBC  Morphology 09/05/2024 MORPHOLOGY UNREMARKABLE   Final   Smear Review 09/05/2024 See Note   Final   PLATELETS APPEAR DECREASED   Immature Granulocytes 09/05/2024 0  % Final   Abs Immature Granulocytes 09/05/2024 0.00  0.00 - 0.07 K/uL Final   Performed at [facility], [address] [place], [place] [address]  Admission on 08/31/2024, Discharged on 09/01/2024  Component Date Value Ref Range Status   Glucose-Capillary 08/31/2024 167 (H)  70 - 99 mg/dL Final   Glucose reference range applies only to samples taken after fasting for at least 8 hours.  Admission on 08/29/2024, Discharged on 08/29/2024  Component Date Value Ref Range Status   WBC 08/29/2024 2.9 (L)  4.0 - 10.5 K/uL Final   RBC 08/29/2024 4.05 (L)  4.22 - 5.81 MIL/uL Final   Hemoglobin 08/29/2024 11.8 (L)  13.0 - 17.0 g/dL Final   HCT 89/78/7974 37.3 (L)  39.0 - 52.0 % Final   MCV 08/29/2024 92.1  80.0 - 100.0 fL Final   MCH 08/29/2024 29.1  26.0 - 34.0 pg Final   MCHC 08/29/2024 31.6  30.0 - 36.0 g/dL Final   RDW 89/78/7974 18.8 (H)  11.5 - 15.5 % Final   Platelets 08/29/2024 95 (L)  150 - 400 K/uL Final   Comment: REPEATED TO VERIFY PLATELET COUNT CONFIRMED BY SMEAR Immature Platelet Fraction may be clinically indicated, consider ordering this additional test [id number]    nRBC 08/29/2024 0.0  0.0 - 0.2 % Final   Neutrophils Relative % 08/29/2024 44  % Final   Neutro Abs 08/29/2024 1.3 (L)  1.7 - 7.7 K/uL Final   Lymphocytes Relative 08/29/2024 44  % Final   Lymphs Abs 08/29/2024 1.3  0.7 - 4.0 K/uL Final   Monocytes Relative 08/29/2024 8  % Final   Monocytes Absolute 08/29/2024 0.2  0.1 - 1.0 K/uL Final   Eosinophils Relative 08/29/2024 2  % Final   Eosinophils Absolute 08/29/2024 0.1  0.0 - 0.5 K/uL Final   Basophils Relative 08/29/2024 2  % Final   Basophils Absolute 08/29/2024 0.1  0.0 - 0.1 K/uL Final   WBC Morphology 08/29/2024 MORPHOLOGY UNREMARKABLE   Final   RBC Morphology 08/29/2024 MORPHOLOGY  UNREMARKABLE   Final   Smear Review 08/29/2024  Normal platelet morphology   Final   Immature Granulocytes 08/29/2024 0  % Final   Abs Immature Granulocytes 08/29/2024 0.01  0.00 - 0.07 K/uL Final   Performed at [facility], [address] [place], [place] [address]   Sodium 08/29/2024 142  135 - 145 mmol/L Final   Potassium 08/29/2024 3.9  3.5 - 5.1 mmol/L Final   Chloride 08/29/2024 104  98 - 111 mmol/L Final   CO2 08/29/2024 25  22 - 32 mmol/L Final   Glucose, Bld 08/29/2024 101 (H)  70 - 99 mg/dL Final   Glucose reference range applies only to samples taken after fasting for at least 8 hours.   BUN 08/29/2024 7  6 - 20 mg/dL Final   Creatinine, Ser 08/29/2024 0.48 (L)  0.61 - 1.24 mg/dL Final   Calcium  08/29/2024 8.4 (L)  8.9 - 10.3 mg/dL Final   Total Protein 89/78/7974 7.0  6.5 - 8.1 g/dL Final   Albumin 89/78/7974 4.2  3.5 - 5.0 g/dL Final   AST 89/78/7974 52 (H)  15 - 41 U/L Final   ALT 08/29/2024 30  0 - 44 U/L Final   Alkaline Phosphatase 08/29/2024 72  38 - 126 U/L Final   Total Bilirubin 08/29/2024 0.3  0.0 - 1.2 mg/dL Final   GFR, Estimated 08/29/2024 >60  >60 mL/min Final   Comment: (NOTE) Calculated using the CKD-EPI Creatinine Equation (2021)    Anion gap 08/29/2024 13  5 - 15 Final   Performed at [facility], [address], [place], [place] [address]   Alcohol , Ethyl (B) 08/29/2024 441 (HH)  <15 mg/dL Final   Comment: Critical Value, Read Back and verified with Lajuana LANES RN at 1447 08/29/24 [place], T (NOTE) For medical purposes only. Performed at [facility], [address] [place], [place] [address]    Magnesium  08/29/2024 2.1  1.7 - 2.4 mg/dL Final   Performed at [facility], [address], [place], [place] [address]  Admission on 08/15/2024, Discharged on 08/16/2024  Component Date Value Ref Range Status   Sodium 08/15/2024 145  135 - 145 mmol/L Final   Potassium 08/15/2024  3.5  3.5 - 5.1 mmol/L Final   Chloride 08/15/2024 105  98 - 111 mmol/L Final   CO2 08/15/2024 24  22 - 32 mmol/L Final   Glucose, Bld 08/15/2024 100 (H)  70 - 99 mg/dL Final   Glucose reference range applies only to samples taken after fasting for at least 8 hours.   BUN 08/15/2024 13  6 - 20 mg/dL Final   Creatinine, Ser 08/15/2024 0.61  0.61 - 1.24 mg/dL Final   Calcium  08/15/2024 9.3  8.9 - 10.3 mg/dL Final   Total Protein 89/92/7974 7.1  6.5 - 8.1 g/dL Final   Albumin 89/92/7974 4.4  3.5 - 5.0 g/dL Final   AST 89/92/7974 37  15 - 41 U/L Final   ALT 08/15/2024 23  0 - 44 U/L Final   Alkaline Phosphatase 08/15/2024 58  38 - 126 U/L Final   Total Bilirubin 08/15/2024 0.2  0.0 - 1.2 mg/dL Final   GFR, Estimated 08/15/2024 >60  >60 mL/min Final   Comment: (NOTE) Calculated using the CKD-EPI Creatinine Equation (2021)    Anion gap 08/15/2024 16 (H)  5 - 15 Final   Performed at [facility], [address], [place], [place] [address]   WBC 08/15/2024 4.6  4.0 - 10.5 K/uL Final  RBC 08/15/2024 3.85 (L)  4.22 - 5.81 MIL/uL Final   Hemoglobin 08/15/2024 11.4 (L)  13.0 - 17.0 g/dL Final   HCT 89/92/7974 36.0 (L)  39.0 - 52.0 % Final   MCV 08/15/2024 93.5  80.0 - 100.0 fL Final   MCH 08/15/2024 29.6  26.0 - 34.0 pg Final   MCHC 08/15/2024 31.7  30.0 - 36.0 g/dL Final   RDW 89/92/7974 20.6 (H)  11.5 - 15.5 % Final   Platelets 08/15/2024 265  150 - 400 K/uL Final   nRBC 08/15/2024 0.0  0.0 - 0.2 % Final   Performed at [facility], [address], [place], [place] [address]   Glucose-Capillary 08/15/2024 111 (H)  70 - 99 mg/dL Final   Glucose reference range applies only to samples taken after fasting for at least 8 hours.   Alcohol , Ethyl (B) 08/15/2024 >450 (HH)  <15 mg/dL Final   Comment: Critical Value, Read Back and verified with LANG PARAS RN @ 2330 ON 08/15/2024 BY MTA (NOTE) For medical purposes only. Performed at [facility], [address] [place], [place] [address]    Glucose-Capillary 08/15/2024 108 (H)  70 - 99 mg/dL Final   Glucose reference range applies only to samples taken after fasting for at least 8 hours.  There may be more visits with results that are not included.    Allergies: Risperidone  and paliperidone, Tegretol  [carbamazepine ], and Caffeine  Medications:  Facility Ordered Medications  Medication   [COMPLETED] haloperidol  lactate (HALDOL ) injection 2 mg   [COMPLETED] ondansetron  (ZOFRAN -ODT) disintegrating tablet 4 mg   [COMPLETED] lactated ringers  bolus 1,000 mL   [COMPLETED] LORazepam  (ATIVAN ) tablet 1 mg   [COMPLETED] magnesium  sulfate IVPB 2 g 50 mL   acetaminophen  (TYLENOL ) tablet 650 mg   alum & mag hydroxide-simeth (MAALOX/MYLANTA) 200-200-20 MG/5ML suspension 30 mL   magnesium  hydroxide (MILK OF MAGNESIA) suspension 30 mL   [COMPLETED] thiamine  (VITAMIN B1) injection 100 mg   multivitamin with minerals tablet 1 tablet   chlordiazePOXIDE  (LIBRIUM ) capsule 25 mg   hydrOXYzine  (ATARAX ) tablet 25 mg   loperamide  (IMODIUM ) capsule 2-4 mg   ondansetron  (ZOFRAN -ODT) disintegrating tablet 4 mg   haloperidol  (HALDOL ) tablet 5 mg   And   diphenhydrAMINE  (BENADRYL ) capsule 50 mg   haloperidol  lactate (HALDOL ) injection 5 mg   And   diphenhydrAMINE  (BENADRYL ) injection 50 mg   And   LORazepam  (ATIVAN ) injection 2 mg   haloperidol  lactate (HALDOL ) injection 10 mg   And   diphenhydrAMINE  (BENADRYL ) injection 50 mg   And   LORazepam  (ATIVAN ) injection 2 mg   PTA Medications  Medication Sig   lipase/protease/amylase (CREON ) 36000 UNITS CPEP capsule Take 1 capsule (36,000 Units total) by mouth 3 (three) times daily before meals.   sulfamethoxazole -trimethoprim  (BACTRIM  DS) 800-160 MG tablet Take 1 tablet by mouth every Monday, Wednesday, and Friday.   chlordiazePOXIDE  (LIBRIUM ) 25 MG capsule Take 2 capsules by mouth three times daily for 1 day, 1-2 caps twice daily for 1  day, then 1-2 caps daily for 1 day. (Patient not taking: Reported on 10/10/2024)   bictegravir-emtricitabine -tenofovir  AF (BIKTARVY ) 50-200-25 MG TABS tablet Take 1 tablet by mouth daily.   gabapentin  (NEURONTIN ) 100 MG capsule Take 1 capsule (100 mg total) by mouth at bedtime.   levETIRAcetam  (KEPPRA ) 500 MG tablet Take 1 tablet (500 mg total) by mouth 2 (two) times daily.    Screenings    Flowsheet Row Most Recent Value  CIWA-Ar Total  10    Medical Decision Making  Observation unit    Recommendations  Based on my evaluation the patient does not appear to have an emergency medical condition.  Gaither Pouch, NP 10/11/24  3:37 AM

## 2024-10-10 NOTE — ED Notes (Signed)
 PA notified that pt felt as if he was going to have a seizure. PA stated 1mg  of Ativan  would be okay.

## 2024-10-10 NOTE — Consult Note (Signed)
 [facility] Consult Note  Patient Name: Gary [name] MRN: [id number] DOB: 1986-02-14 DATE OF Consult: 10/10/2024  PRIMARY PSYCHIATRIC DIAGNOSES   1.  Major Depression, Recurrent, Severe without Psychotic Features 2.  Alcohol  Use Disorder, Severe with Dependence   RECOMMENDATIONS  Recommendations: Medication recommendations: Patient has not been on any psychotropic medications (scheduled) recently, and I do not recommend that any scheduled meds be begun in the ED.  Patient is on a CIWA protocol, and for now I would recommend treating any anxiety with the protocol. Non-Medication/therapeutic recommendations: Patient is having suicidal ideation with plan, so recommend that he continue under close observation, as per ED protocol, until he can be safely admitted either to Psychiatry or to Medicine, with inhouse psychiatric consultation Is inpatient psychiatric hospitalization recommended for this patient? Yes (Explain why): As above, patient is acutely suicidal because of major depression, and he meets criteria for admission.  He is willing to come into the hospital voluntarily. Is another care setting recommended for this patient? (examples may include Crisis Stabilization Unit, Residential/Recovery Treatment, ALF/SNF, Memory Care Unit)  No (Explain why): As above From a psychiatric perspective, is this patient appropriate for discharge to an outpatient setting/resource or other less restrictive environment for continued care?  No (Explain why): As above Follow-Up Telepsychiatry C/L services: We will sign off for now. Please re-consult our service if needed for any concerning changes in the patient's condition, discharge planning, or questions. Communication: Treatment team members (and family members if applicable) who were involved in treatment/care discussions and planning, and with whom we spoke or engaged with via secure text/chat, include the following: Secure message sent to Mr.  [name], ED provider, and staff, outlining recommendations  Thank you for involving us  in the care of this patient. If you have any additional questions or concerns, please call [phone] and ask for the provider on-call.   TELEPSYCHIATRY ATTESTATION & CONSENT   As the provider for this telehealth consult, I attest that I verified the patient's identity using two separate identifiers, introduced myself to the patient, provided my credentials, disclosed my location, and performed this encounter via a HIPAA-compliant, real-time, face-to-face, two-way, interactive audio and video platform and with the full consent and agreement of the patient (or guardian as applicable.)  Patient physical location:  ED, [facility]. Telehealth provider physical location: home office in state of [place].  Video start time: 0620h EST  Video end time: 0635h EST   Total time spent in this encounter was 30 minutes, including record review, clinical interview, behavior observations, discussion of impressions and recommendations (including medications and hospitalization), and consultation/communication with relevant parties   IDENTIFYING DATA  Gary [name] is a 38 y.o. year-old male for whom a psychiatric consultation has been ordered by the primary provider. The patient was identified using two separate identifiers.  CHIEF COMPLAINT/REASON FOR CONSULT   I can't live like this anymore.  I can't get over the alcohol , and I just want to die.   HISTORY OF PRESENT ILLNESS (HPI)   The patient presents with very long history of mood dysregulation and severe EtOH dependence, with multiple visits to ED for treatment of alcohol  withdrawal, along with multiple admissions for rehab.  Has had two admissions for behavioral health in the past, the last one being around 2022.  Patient presents tonight with acute suicidal ideation, along with continuing issues with EtOH withdrawal.  Patient states that the  longest he has managed to remain sober over the past years is  about a week.  Clemens and injured his hip recently, and using walker.  Continues to live on the street, staying with friends here and there.  He does stay in touch with family, but they do not provide him any physical aid.  Patient appears quite depressed, and he states that especially now with the hip pain and need to use a walker, in combination with his chronic issues, he is having significant desire to kill himself and just get this all over with. No homicidal ideation.  No psychotic sx's:  can have them with severe ETOH withdrawal, but he is having none now.  He is on a CIWA protocol.  Denies drug usage.  BAL on admission 335.  UDS positive for benzos (given in ED setting)   PAST PSYCHIATRIC HISTORY  As above Otherwise as per HPI above.  PAST MEDICAL HISTORY  Past Medical History:  Diagnosis Date   Alcohol  abuse    Alcohol -induced pancreatitis 04/16/2022   Anxiety    Bipolar 2 disorder (HCC)    HIV (human immunodeficiency virus infection) (HCC)    Pancreatitis    PTSD (post-traumatic stress disorder)    Schizophrenia (HCC)    Seizures (HCC)    Subdural hematoma (HCC)      HOME MEDICATIONS  Facility Ordered Medications  Medication   LORazepam  (ATIVAN ) tablet 1-4 mg   Or   LORazepam  (ATIVAN ) injection 1-4 mg   thiamine  (VITAMIN B1) tablet 100 mg   Or   thiamine  (VITAMIN B1) injection 100 mg   folic acid  (FOLVITE ) tablet 1 mg   multivitamin with minerals tablet 1 tablet   [COMPLETED] haloperidol  lactate (HALDOL ) injection 2 mg   [COMPLETED] ondansetron  (ZOFRAN -ODT) disintegrating tablet 4 mg   PTA Medications  Medication Sig   lipase/protease/amylase (CREON ) 36000 UNITS CPEP capsule Take 1 capsule (36,000 Units total) by mouth 3 (three) times daily before meals. (Patient not taking: Reported on 09/06/2024)   sulfamethoxazole -trimethoprim  (BACTRIM  DS) 800-160 MG tablet Take 1 tablet by mouth every Monday, Wednesday,  and Friday.   chlordiazePOXIDE  (LIBRIUM ) 25 MG capsule Take 2 capsules by mouth three times daily for 1 day, 1-2 caps twice daily for 1 day, then 1-2 caps daily for 1 day.   bictegravir-emtricitabine -tenofovir  AF (BIKTARVY ) 50-200-25 MG TABS tablet Take 1 tablet by mouth daily.   gabapentin  (NEURONTIN ) 100 MG capsule Take 1 capsule (100 mg total) by mouth at bedtime.   levETIRAcetam  (KEPPRA ) 500 MG tablet Take 1 tablet (500 mg total) by mouth 2 (two) times daily.   As above.  On no psychotropic medication  ALLERGIES  Allergies  Allergen Reactions   Risperidone  And Paliperidone Other (See Comments)    Patient states causes opposite effect (he will not take again), and gave him psychotic thoughts   Tegretol  [Carbamazepine ] Other (See Comments)    Vertigo  Paralysis   Caffeine Palpitations    SOCIAL & SUBSTANCE USE HISTORY  Social History   Socioeconomic History   Marital status: Single    Spouse name: Not on file   Number of children: Not on file   Years of education: Not on file   Highest education level: Not on file  Occupational History   Occupation: unemployed  Tobacco Use   Smoking status: Every Day    Current packs/day: 0.50    Average packs/day: 0.5 packs/day for 21.9 years (11.0 ttl pk-yrs)    Types: Cigarettes    Start date: 2004    Passive exposure: Current   Smokeless tobacco: Never  Tobacco comments:    unable to smoke while incarcerated 6+ months 02/13/20  Vaping Use   Vaping status: Never Used  Substance and Sexual Activity   Alcohol  use: Yes    Alcohol /week: 105.0 standard drinks of alcohol     Types: 105 Cans of beer per week    Comment: drinks every day, all day, whatever I can get my hands on. Half a gallon of vodka 12/16   Drug use: Not Currently    Types: Methamphetamines, Cocaine    Comment: last used 10/22/2023   Sexual activity: Yes    Partners: Male, Male    Comment: declined condoms 01/2024  Other Topics Concern   Not on file  Social  History Narrative   Currently living on the streets   Independent at baseline   [facility]asionally [facility] the [facility]   Social Drivers of Health   Financial Resource Strain: Patient Declined (10/11/2023)   Overall Financial Resource Strain (CARDIA)    Difficulty of Paying Living Expenses: Patient declined  Food Insecurity: Food Insecurity Present (09/06/2024)   Hunger Vital Sign    Worried About Running Out of Food in the Last Year: Often true    Ran Out of Food in the Last Year: Often true  Transportation Needs: Unmet Transportation Needs (09/06/2024)   PRAPARE - Administrator, Civil Service (Medical): Yes    Lack of Transportation (Non-Medical): Yes  Physical Activity: Not on file  Stress: No Stress Concern Present (06/30/2022)   Received from [facility] - Occupational Stress Questionnaire    Feeling of Stress : Not at all  Recent Concern: Stress - Stress Concern Present (06/25/2022)   Received from [facility] - Occupational Stress Questionnaire    Feeling of Stress : Rather much  Social Connections: Patient Declined (04/18/2024)   Social Connection and Isolation Panel    Frequency of Communication with Friends and Family: Patient declined    Frequency of Social Gatherings with Friends and Family: Patient declined    Attends Religious Services: Patient declined    Database Administrator or Organizations: Patient declined    Attends Banker Meetings: Patient declined    Marital Status: Patient declined  Recent Concern: Social Connections - Socially Isolated (02/03/2024)   Social Connection and Isolation Panel    Frequency of Communication with Friends and Family: Never    Frequency of Social Gatherings with Friends and Family: Never    Attends Religious Services: Never    Database Administrator or Organizations: No    Attends Engineer, Structural: Never     Marital Status: Never married   Social History   Tobacco Use  Smoking Status Every Day   Current packs/day: 0.50   Average packs/day: 0.5 packs/day for 21.9 years (11.0 ttl pk-yrs)   Types: Cigarettes   Start date: 2004   Passive exposure: Current  Smokeless Tobacco Never  Tobacco Comments   unable to smoke while incarcerated 6+ months 02/13/20   Social History   Substance and Sexual Activity  Alcohol  Use Yes   Alcohol /week: 105.0 standard drinks of alcohol    Types: 105 Cans of beer per week   Comment: drinks every day, all day, whatever I can get my hands on. Half a gallon of vodka 12/16   Social History   Substance and Sexual Activity  Drug Use Not Currently   Types: Methamphetamines, Cocaine   Comment:  last used 10/22/2023      FAMILY HISTORY  Family History  Problem Relation Age of Onset   Hypertension Mother    Alcohol  abuse Mother    Alcoholism Mother    Alcohol  abuse Father    Colon cancer Other    Other Other    Cancer Other    Family Psychiatric History (if known):  An older brother with a mood disorder. Strong family history of substance abuse   MENTAL STATUS EXAM (MSE)  Mental Status Exam: General Appearance: Disheveled  Orientation:  Full (Time, Place, and Person)  Memory:  Immediate;   Fair Recent;   Fair Remote;   Fair  Concentration:  Concentration: Poor and Attention Span: Poor  Recall:  Fair  Attention  Poor  Eye Contact:  Minimal  Speech:  Slow  Language:  Good  Volume:  Decreased  Mood: I can't take this anymore  Affect:  Depressed and Flat  Thought Process:  Descriptions of Associations: Circumstantial  Thought Content:  Rumination and Tangential  Suicidal Thoughts:  Yes.  with intent/plan  Homicidal Thoughts:  No  Judgement:  Poor  Insight:  Shallow  Psychomotor Activity:  Decreased  Akathisia:  Negative  Fund of Knowledge:  Fair    Assets:  Communication Skills Desire for Improvement  Cognition:  WNL  ADL's:  Intact   AIMS (if indicated):       VITALS  Blood pressure 121/80, pulse 80, temperature 98.6 F (37 C), temperature source Oral, resp. rate 18, SpO2 95%.  LABS  Admission on 10/09/2024  Component Date Value Ref Range Status   Sodium 10/09/2024 138  135 - 145 mmol/L Final   Potassium 10/09/2024 3.5  3.5 - 5.1 mmol/L Final   Chloride 10/09/2024 95 (L)  98 - 111 mmol/L Final   CO2 10/09/2024 29  22 - 32 mmol/L Final   Glucose, Bld 10/09/2024 133 (H)  70 - 99 mg/dL Final   Glucose reference range applies only to samples taken after fasting for at least 8 hours.   BUN 10/09/2024 9  6 - 20 mg/dL Final   Creatinine, Ser 10/09/2024 0.60 (L)  0.61 - 1.24 mg/dL Final   Calcium  10/09/2024 9.5  8.9 - 10.3 mg/dL Final   Total Protein 87/98/7974 7.6  6.5 - 8.1 g/dL Final   Albumin 87/98/7974 4.5  3.5 - 5.0 g/dL Final   AST 87/98/7974 80 (H)  15 - 41 U/L Final   ALT 10/09/2024 64 (H)  0 - 44 U/L Final   Alkaline Phosphatase 10/09/2024 76  38 - 126 U/L Final   Total Bilirubin 10/09/2024 0.3  0.0 - 1.2 mg/dL Final   GFR, Estimated 10/09/2024 >60  >60 mL/min Final   Comment: (NOTE) Calculated using the CKD-EPI Creatinine Equation (2021)    Anion gap 10/09/2024 14  5 - 15 Final   Performed at [facility], [address] [place], [place] [address]   Alcohol , Ethyl (B) 10/09/2024 335 (HH)  <15 mg/dL Final   Comment: Critical Value, Read Back and verified with RONAL FRIES, RN 10/09/24 1915 J.COLE (NOTE) For medical purposes only. Performed at [facility], [address] [place], [place] [address]    WBC 10/09/2024 4.0  4.0 - 10.5 K/uL Final   RBC 10/09/2024 3.95 (L)  4.22 - 5.81 MIL/uL Final   Hemoglobin 10/09/2024 12.4 (L)  13.0 - 17.0 g/dL Final   HCT 87/98/7974 37.0 (L)  39.0 - 52.0 % Final   MCV  10/09/2024 93.7  80.0 - 100.0 fL Final   MCH 10/09/2024 31.4  26.0 - 34.0 pg Final   MCHC 10/09/2024 33.5  30.0 - 36.0 g/dL Final   RDW 87/98/7974 19.2 (H)  11.5 -  15.5 % Final   Platelets 10/09/2024 108 (L)  150 - 400 K/uL Final   nRBC 10/09/2024 0.0  0.0 - 0.2 % Final   Performed at [facility], [address] [place], [place] [address]   Opiates 10/09/2024 NEGATIVE  NEGATIVE Final   Cocaine 10/09/2024 NEGATIVE  NEGATIVE Final   Benzodiazepines 10/09/2024 POSITIVE (A)  NEGATIVE Final   Amphetamines 10/09/2024 NEGATIVE  NEGATIVE Final   Tetrahydrocannabinol 10/09/2024 NEGATIVE  NEGATIVE Final   Barbiturates 10/09/2024 NEGATIVE  NEGATIVE Final   Methadone Scn, Ur 10/09/2024 NEGATIVE  NEGATIVE Final   Fentanyl  10/09/2024 NEGATIVE  NEGATIVE Final   Comment: (NOTE) Drug screen is for Medical Purposes only. Positive results are preliminary only. If confirmation is needed, notify lab within 5 days.  Drug Class                 Cutoff (ng/mL) Amphetamine and metabolites 1000 Barbiturate and metabolites 200 Benzodiazepine              200 Opiates and metabolites     300 Cocaine and metabolites     300 THC                         50 Fentanyl                     5 Methadone                   300  Trazodone  is metabolized in vivo to several metabolites,  including pharmacologically active m-CPP, which is excreted in the  urine.  Immunoassay screens for amphetamines and MDMA have potential  cross-reactivity with these compounds and may provide false positive  result.  Performed at [facility], [address] [place], [place] [address]    Lipase 10/09/2024 45  11 - 51 U/L Final   Performed at [facility], [address], [place], [place] [address]    PSYCHIATRIC REVIEW OF SYSTEMS (ROS)  ROS: Notable for the following relevant positive findings: Review of Systems  Neurological:  Positive for tremors.  Psychiatric/Behavioral:  Positive for depression, substance abuse and suicidal ideas. The patient is nervous/anxious and has insomnia.     Additional findings:      Musculoskeletal:  Impaired      Gait & Station: Wheelchair/Walker      Pain Screening: Present - mild to moderate      Nutrition & Dental Concerns:  Reviewed  RISK FORMULATION/ASSESSMENT  Is the patient experiencing any suicidal or homicidal ideations: Yes       Explain if yes: Patient with active suicidal ideation with plan to secure a gun to shoot himself.  Protective factors considered for safety management:   Patient does have contact with family, but his access to reliable support in community is quite limited  Risk factors/concerns considered for safety management:  Depression Substance abuse/dependence Physical illness/chronic pain Access to lethal means Hopelessness Impulsivity Isolation Male gender Unmarried  Is there a safety management plan with the patient and treatment team to minimize risk factors and promote protective factors: No           Explain: As above, patient without reliable community support  Is crisis care placement or psychiatric hospitalization recommended: Yes     Based on my current evaluation and risk assessment, patient is determined at this time to be at:  High risk  *RISK ASSESSMENT Risk assessment is a dynamic process; it is possible that this patient's condition, and risk level, may change. This should be re-evaluated and managed over time as appropriate. Please re-consult psychiatric consult services if additional assistance is needed in terms of risk assessment and management. If your team decides to discharge this patient, please advise the patient how to best access emergency psychiatric services, or to call 911, if their condition worsens or they feel unsafe in any way.   Adriana JINNY Pontes, MD Telepsychiatry Consult Services

## 2024-10-10 NOTE — ED Provider Notes (Addendum)
  [place] EMERGENCY DEPARTMENT AT [facility] Emergency Medicine Observation Re-evaluation Note  Gary [name] is a 38 y.o. male, seen on rounds today.  Pt initially presented on 10/09/24 at 1659 to the ED for complaints of  Chief Complaint  Patient presents with   detox  Patient presented to the emergency department requesting assistance with detox from alcohol , as well as reporting suicidal ideation PMHx: Alcohol  use disorder, HIV, polysubstance use disorder, pancytopenia, bipolar 2 Currently, the patient is resting comfortably in bed.  Physical Exam  BP 121/80 (BP Location: Left Arm)   Pulse 80   Temp 98.6 F (37 C) (Oral)   Resp 18   SpO2 95%  Physical Exam General: NAD Lungs: Normal effort Psych: Currently calm  ED Course / MDM  EKG:EKG Interpretation Date/Time:    Ventricular Rate:    PR Interval:    QRS Duration:    QT Interval:    QTC Calculation:   R Axis:      Text Interpretation:    I have reviewed the labs performed to date as well as medications administered while in observation.  Recent changes in the last 24 hours include patient presented to the emergency department, presentation not consistent with acute withdrawal, psychiatry was consulted for patient's suicidal ideation and recommended inpatient psychiatric hospitalization. Home medications: Reordered this a.m. Diet: Ordered by prior team  Plan  Current plan is for awaiting psychiatric placement.  Patient was accepted at [facility], outside psychiatrist did request that patient be administered additional fluids and IV magnesium .  These were administered.  Patient awaiting bed to become available at OSH at the end of my shift.   Rogelia Jerilynn RAMAN, MD 10/10/24 9187    Rogelia Jerilynn RAMAN, MD 10/10/24 1630

## 2024-10-10 NOTE — ED Notes (Signed)
 Pt stated that if we did not give him something for nausea, he would not be able to eat breakfast. He also stated we gave him such a low dose of medication that it did nothing. I did let the PA know what the pt was reporting.

## 2024-10-10 NOTE — ED Notes (Signed)
 Patient off unit to [facility] per provider. Patient alert, cooperative and no s/s of distress at time of discharge. Discharge information and belongings given to [facility] for facility. Patient can ambulate (has walker for assist). Patient on unit transported by w/c, escorted by RN and NT. Patient transported by [employer].

## 2024-10-10 NOTE — Progress Notes (Signed)
   10/10/24 1811  [facility] Triage Screening (Walk-ins at [facility] only)  What Is the Reason for Your Visit/Call Today? Gary [name] 31y male arrived to [facility] originally preadmitted to [facility] but is being placed in OBS unit. PT states he is struggling w/ alcoholism; has been for 10 years, heavily drinking vodka everyday. PT stated that he had thoughts of harming himself a couple of days ago, no plan. PT denies HI, AH and substance use.  How Long Has This Been Causing You Problems? > than 6 months  Have You Recently Had Any Thoughts About Hurting Yourself? Yes  How long ago did you have thoughts about hurting yourself? a couple of days ago  Are You Planning to Commit Suicide/Harm Yourself At This time? No  Have you Recently Had Thoughts About Hurting Someone Sherral? No  Are You Planning To Harm Someone At This Time? No  Physical Abuse Denies  Verbal Abuse Denies  Sexual Abuse Denies  Exploitation of patient/patient's resources Denies  Self-Neglect Yes, present (Comment)  Are you currently experiencing any auditory, visual or other hallucinations? Yes  Please explain the hallucinations you are currently experiencing: shadow people in corner of eye, sees people (shadowy figures) walking by  Have You Used Any Alcohol  or Drugs in the Past 24 Hours? No  Do you have any current medical co-morbidities that require immediate attention? No  Clinician description of patient physical appearance/behavior: cooperative, shaky, anxious, irritable, in burgundy paper scrubs  What Do You Feel Would Help You the Most Today? Alcohol  or Drug Use Treatment;Medication(s)  Determination of Need Urgent (48 hours)  Options For Referral Facility-Based Crisis;BH Urgent Care;Intensive Outpatient Therapy  Determination of Need filed? Yes

## 2024-10-10 NOTE — ED Provider Notes (Signed)
  Physical Exam  BP 105/63 (BP Location: Left Arm)   Pulse 86   Temp 98.3 F (36.8 C) (Oral)   Resp 18   SpO2 95%   Physical Exam  Procedures  Procedures  ED Course / MDM    Medical Decision Making Amount and/or Complexity of Data Reviewed Labs: ordered.  Risk OTC drugs. Prescription drug management.   Patient care assumed at signout.  Patient well-known to this department with 46 visits over the past 6 months.  Patient presents this evening requesting alcohol  detox.  He also reportedly stated he was suicidal due to chronic alcohol  use.  Initially plan was to potentially discharge patient after seeing patient ambulate and tolerate oral intake.  Patient was able to ambulate but stated that if discharged I am going to go jump off a bridge.  I went discussed with the patient the patient states he is suicidal due to his chronic alcohol  use and homelessness.  He states he is not willing to live anymore and wants to die.  I discussed with my attending physician at the time. We performed a chart review and saw no recent occurrences of suicidality or recent behavioral health evaluations.   Patient is here voluntarily. CIWA protocol was initiated prior to my assumption of care. Patient is medically clear for TTS evaluation.        Gary [name] 10/10/24 9444    Randol Simmonds, MD 10/10/24 [phone]

## 2024-10-10 NOTE — Progress Notes (Signed)
 Pt has been accepted to [facility] on 10/09/2024 Bed assignment: 161  Pt meets inpatient criteria per: Adriana Pontes MD   Attending Physician will be: Cole MD   Report can be called to: N/A   Pt can arrive after  [facility] WILL UPDATE   Care Team Notified: [facility] Cherylynn Ernst RN, Beth Jenel RN, Chesley Holt [facility]   [place] Zamarian Scarano LCSW-A   10/10/2024 11:13 AM

## 2024-10-11 MED ORDER — LEVETIRACETAM ER 500 MG PO TB24
500.0000 mg | ORAL_TABLET | Freq: Two times a day (BID) | ORAL | Status: DC
  Administered 2024-10-11: 500 mg via ORAL
  Filled 2024-10-11: qty 1

## 2024-10-11 MED ORDER — LORAZEPAM 1 MG PO TABS
1.0000 mg | ORAL_TABLET | Freq: Once | ORAL | Status: AC
  Administered 2024-10-11: 1 mg via ORAL
  Filled 2024-10-11: qty 1

## 2024-10-11 MED ORDER — ADULT MULTIVITAMIN W/MINERALS CH: 1.0000 | ORAL_TABLET | Freq: Every day | ORAL | Status: AC

## 2024-10-11 NOTE — ED Notes (Signed)
 Patient Is discharging at this time. Printed AVS reviewed with patient along with resources. Patient denies SI, HI, and A/V/H. Valuables/belongings returned to patient. Patient provided with bus passes. No s/s of current distress.

## 2024-10-11 NOTE — Discharge Instructions (Addendum)
 Plan Of Care/Follow-up recommendations:  Please seek appropriate care for alcohol  use disorder, epilepsy, and risk for alcohol  withdrawal seizures.  Activity: Daily physical activity; reduce sedentary behaviors   Diet: Portion-limited diet rich in produce, whole (minimally processed) grains, nuts/seeds, eggs, beans/legumes, seafood, lowfat dairy (if tolerated), fermented food, lean meat. Copious water  intake. Limit (ultra)processed foods and sugar-sweetened beverages.    Other: -Follow-up with your outpatient psychiatric provider -instructions on appointment date, time, and address (location) are provided to you in discharge paperwork. Please establish with outpatient psychiatry including the [facility] 2nd floor open access clinic in this building.    -Take your neuropsychiatric medications as prescribed at discharge - instructions are provided to you in the discharge paperwork. Keppra  500mg  BID.   -Follow-up with outpatient primary care doctor to optimize health maintenance/preventative care and other specialists -for management of chronic medical disease. Infectious Disease and Neurology.  -Recommend total abstinence from alcohol , tobacco, and other illicit drug use at discharge. Although you plan to self-taper with alcohol , medical detoxification is more effective and safer. Please return to the [facility] or nearest ED if you change your mind.   -If your psychiatric symptoms recur, worsen, or if you have side effects to your psychiatric medications, call your outpatient psychiatric provider, 911, [phone] or go to the nearest emergency department.   -If suicidal thoughts occur, immediately call your outpatient psychiatric provider, 911, [phone] or go to the nearest emergency department.

## 2024-10-11 NOTE — ED Provider Notes (Signed)
 FBC/OBS ASAP Discharge Summary  Date and Time: 10/11/2024 11:41 AM  Name: Gary [name]  MRN:  [id number]   Discharge Diagnoses:  Final diagnoses:  Alcohol  abuse  Homelessness unspecified  Alcohol  use disorder, severe, dependence (HCC)  Posttraumatic stress disorder  Substance induced mood disorder (HCC)   Subjective: Patient is extremely disappointed with quality of care he is receiving. He believes approach with CIWA triggered Librium  was flawed from the beginning but he also thinks lorazepam  taper will be inadequate given her wants much higher doses given more frequently. His preference is for IV in fact. After MD explained the concept of medical alcohol  detoxification designed to protect from w/d seizures, he noted that did not work and have you tried that yourself. Patient notes general anorexia occurs during abstinence so regardless of pharmacological method he would not eat for the duration. In contrast, when he drinks, he eats quite well. He only wants detox and does not want residential treatment given concerns about how those programs operate (involuntary detention, focused on money). Although he took his lorazepam  and levetiracetam  this morning, he requests immediate discharge to the community. He does not have a place to live but will make do using his own resources.   Stay Summary: Patient initially presented to [facility] 12/1 requesting alcohol  detox then incidental suicidal ideation in context of BAL >300. Condition notable for anorexia secondary to alcohol  abstinence but no other prominent w/d sxs. Lorazepam  given PRN through 12/2 afternoon when patient was expected to transfer to [facility] to complete detox and refer to residential substance treatment. Patient errantly sent to [facility] and subsequent admission to [facility]. During 12/3 morning rounds patient noted observations above and requested discharge. He could not be dissuaded.  Total Time spent with patient: I personally spent  20 minutes on the unit in direct patient care. The direct patient care time included face-to-face time with the patient, reviewing the patient's chart, communicating with other professionals, and coordinating care. Greater than 50% of this time was spent in counseling or coordinating care with the patient regarding goals of hospitalization, psycho-education, and discharge planning needs.  On my assessment the patient denied SI, HI, AVH, paranoia, ideas of reference, or first rank symptoms on day of discharge. Patient endorses alcohol  cravings but is also explicit that the only way he can guarantee not to have a seizure is to resume drinking. Patient denied medication side-effects of Keppra  which he takes for his epilepsy. Patient was not deemed to be a danger to self or others on day of discharge, although provider did not agree with patient's plan of action.    Past Psychiatric History: alcohol  use disorder, PTSD, BPAD2 Past Medical History: epilepsy, EtOH w/d seizures, HIV, EtOH pancreatitis, subdural hematoma Social History: homeless, unemployed  Current Medications:  Current Facility-Administered Medications  Medication Dose Route Frequency Provider Last Rate Last Admin   acetaminophen  (TYLENOL ) tablet 650 mg  650 mg Oral Q6H PRN Trudy Carwin, NP       alum & mag hydroxide-simeth (MAALOX/MYLANTA) 200-200-20 MG/5ML suspension 30 mL  30 mL Oral Q4H PRN Trudy Carwin, NP       chlordiazePOXIDE  (LIBRIUM ) capsule 25 mg  25 mg Oral Q6H PRN Trudy Carwin, NP   25 mg at 10/10/24 2203   haloperidol  (HALDOL ) tablet 5 mg  5 mg Oral TID PRN Trudy Carwin, NP       And   diphenhydrAMINE  (BENADRYL ) capsule 50 mg  50 mg Oral TID PRN Trudy Carwin, NP  haloperidol  lactate (HALDOL ) injection 5 mg  5 mg Intramuscular TID PRN Trudy Carwin, NP       And   diphenhydrAMINE  (BENADRYL ) injection 50 mg  50 mg Intramuscular TID PRN Trudy Carwin, NP       And   LORazepam  (ATIVAN ) injection 2 mg  2 mg  Intramuscular TID PRN Trudy Carwin, NP       haloperidol  lactate (HALDOL ) injection 10 mg  10 mg Intramuscular TID PRN Trudy Carwin, NP       And   diphenhydrAMINE  (BENADRYL ) injection 50 mg  50 mg Intramuscular TID PRN Trudy Carwin, NP       And   LORazepam  (ATIVAN ) injection 2 mg  2 mg Intramuscular TID PRN Trudy Carwin, NP       hydrOXYzine  (ATARAX ) tablet 25 mg  25 mg Oral Q6H PRN Trudy Carwin, NP       levETIRAcetam  (KEPPRA  XR) 24 hr tablet 500 mg  500 mg Oral BID Zailynn Brandel C, MD   500 mg at 10/11/24 9088   loperamide  (IMODIUM ) capsule 2-4 mg  2-4 mg Oral PRN Trudy Carwin, NP       magnesium  hydroxide (MILK OF MAGNESIA) suspension 30 mL  30 mL Oral Daily PRN Trudy Carwin, NP       multivitamin with minerals tablet 1 tablet  1 tablet Oral Daily Trudy Carwin, NP   1 tablet at 10/11/24 9088   ondansetron  (ZOFRAN -ODT) disintegrating tablet 4 mg  4 mg Oral Q6H PRN Trudy Carwin, NP       Current Outpatient Medications  Medication Sig Dispense Refill   bictegravir-emtricitabine -tenofovir  AF (BIKTARVY ) 50-200-25 MG TABS tablet Take 1 tablet by mouth daily. 30 tablet 0   levETIRAcetam  (KEPPRA ) 500 MG tablet Take 1 tablet (500 mg total) by mouth 2 (two) times daily. 60 tablet 0   lipase/protease/amylase (CREON ) 36000 UNITS CPEP capsule Take 1 capsule (36,000 Units total) by mouth 3 (three) times daily before meals. 270 capsule 2   [START ON 10/12/2024] Multiple Vitamin (MULTIVITAMIN WITH MINERALS) TABS tablet Take 1 tablet by mouth daily.     sulfamethoxazole -trimethoprim  (BACTRIM  DS) 800-160 MG tablet Take 1 tablet by mouth every Monday, Wednesday, and Friday. 36 tablet 0    PTA Medications:  Facility Ordered Medications  Medication   [COMPLETED] haloperidol  lactate (HALDOL ) injection 2 mg   [COMPLETED] ondansetron  (ZOFRAN -ODT) disintegrating tablet 4 mg   [COMPLETED] lactated ringers  bolus 1,000 mL   [COMPLETED] LORazepam  (ATIVAN ) tablet 1 mg   [COMPLETED] magnesium  sulfate  IVPB 2 g 50 mL   acetaminophen  (TYLENOL ) tablet 650 mg   alum & mag hydroxide-simeth (MAALOX/MYLANTA) 200-200-20 MG/5ML suspension 30 mL   magnesium  hydroxide (MILK OF MAGNESIA) suspension 30 mL   [COMPLETED] thiamine  (VITAMIN B1) injection 100 mg   multivitamin with minerals tablet 1 tablet   chlordiazePOXIDE  (LIBRIUM ) capsule 25 mg   hydrOXYzine  (ATARAX ) tablet 25 mg   loperamide  (IMODIUM ) capsule 2-4 mg   ondansetron  (ZOFRAN -ODT) disintegrating tablet 4 mg   haloperidol  (HALDOL ) tablet 5 mg   And   diphenhydrAMINE  (BENADRYL ) capsule 50 mg   haloperidol  lactate (HALDOL ) injection 5 mg   And   diphenhydrAMINE  (BENADRYL ) injection 50 mg   And   LORazepam  (ATIVAN ) injection 2 mg   haloperidol  lactate (HALDOL ) injection 10 mg   And   diphenhydrAMINE  (BENADRYL ) injection 50 mg   And   LORazepam  (ATIVAN ) injection 2 mg   levETIRAcetam  (KEPPRA  XR) 24 hr tablet 500 mg   [COMPLETED] LORazepam  (ATIVAN )  tablet 1 mg   PTA Medications  Medication Sig   lipase/protease/amylase (CREON ) 36000 UNITS CPEP capsule Take 1 capsule (36,000 Units total) by mouth 3 (three) times daily before meals.   sulfamethoxazole -trimethoprim  (BACTRIM  DS) 800-160 MG tablet Take 1 tablet by mouth every Monday, Wednesday, and Friday.   bictegravir-emtricitabine -tenofovir  AF (BIKTARVY ) 50-200-25 MG TABS tablet Take 1 tablet by mouth daily.   levETIRAcetam  (KEPPRA ) 500 MG tablet Take 1 tablet (500 mg total) by mouth 2 (two) times daily.   [START ON 10/12/2024] Multiple Vitamin (MULTIVITAMIN WITH MINERALS) TABS tablet Take 1 tablet by mouth daily.       01/28/2024   10:37 AM 02/12/2021    5:32 AM  Depression screen PHQ 2/9  Decreased Interest 0 3  Down, Depressed, Hopeless 0 3  PHQ - 2 Score 0 6  Altered sleeping 3 3  Tired, decreased energy 3 3  Change in appetite 0 2  Feeling bad or failure about yourself  0 3  Trouble concentrating 0 2  Moving slowly or fidgety/restless 0 2  Suicidal thoughts 0 2  PHQ-9  Score 6  23   Difficult doing work/chores Not difficult at all Very difficult     Data saved with a previous flowsheet row definition    Flowsheet Row ED from 10/10/2024 in [facility] ED from 10/05/2024 in [facility] Emergency Department at [facility] ED from 09/29/2024 in [facility] Emergency Department at [facility]  C-SSRS RISK CATEGORY No Risk Low Risk No Risk    Musculoskeletal  Strength & Muscle Tone: within normal limits Gait & Station: unsteady Patient leans: N/A  Psychiatric Specialty Exam  Presentation  General Appearance:  Disheveled  Eye Contact: Good  Speech: Clear and Coherent  Speech Volume: Decreased  Handedness: Right   Mood and Affect  Mood: Anxious; Dysphoric; Hopeless  Affect: Congruent   Thought Process  Thought Processes: Coherent  Descriptions of Associations:Intact  Orientation:Full (Time, Place and Person)  Thought Content:WDL  Diagnosis of Schizophrenia or Schizoaffective disorder in past: No data recorded Duration of Psychotic Symptoms: No data recorded  Hallucinations:Hallucinations: None  Ideas of Reference:None  Suicidal Thoughts:Suicidal Thoughts: No  Homicidal Thoughts:Homicidal Thoughts: No   Sensorium  Memory: Immediate Fair; Recent Poor  Judgment: Impaired  Insight: Shallow   Executive Functions  Concentration: Fair  Attention Span: Fair  Recall: Poor  Fund of Knowledge: Fair  Language: Fair   Psychomotor Activity  Psychomotor Activity: Psychomotor Activity: Tremor   Assets  Assets: Desire for Improvement   Sleep  Sleep: Sleep: Fair  No Safety Checks orders active in given range  Nutritional Assessment (For OBS and FBC admissions only) Has the patient had a weight loss or gain of 10 pounds or more in the last 3 months?: No Has the patient had a decrease in food intake/or appetite?: No Does the patient have dental problems?:  No Does the patient have eating habits or behaviors that may be indicators of an eating disorder including binging or inducing vomiting?: No Has the patient recently lost weight without trying?: 0 Has the patient been eating poorly because of a decreased appetite?: 0 Malnutrition Screening Tool Score: 0    Physical Exam  Physical Exam ROS Blood pressure 109/82, pulse 61, temperature 98.4 F (36.9 C), temperature source Oral, resp. rate 18, SpO2 96%. There is no height or weight on file to calculate BMI.  Demographic Factors:  Male, Caucasian, Low socioeconomic status, and Unemployed  Loss Factors: Decline  in physical health  Historical Factors: Impulsivity  Risk Reduction Factors:   NA  Continued Clinical Symptoms:  Depression:   Comorbid alcohol  abuse/dependence Hopelessness Alcohol /Substance Abuse/Dependencies Epilepsy Previous Psychiatric Diagnoses and Treatments Medical Diagnoses and Treatments/Surgeries  Cognitive Features That Contribute To Risk:  Closed-mindedness and Polarized thinking    Suicide Risk:  Mild:  Suicidal ideation of limited frequency, intensity, duration, and specificity.  There are no identifiable plans, no associated intent, mild dysphoria and related symptoms, good self-control (both objective and subjective assessment), few other risk factors, and identifiable protective factors, including available and accessible social support.  Plan Of Care/Follow-up recommendations:  Please seek appropriate care for alcohol  use disorder, epilepsy, and risk for alcohol  withdrawal seizures.  Activity: Daily physical activity; reduce sedentary behaviors   Diet: Portion-limited diet rich in produce, whole (minimally processed) grains, nuts/seeds, eggs, beans/legumes, seafood, lowfat dairy (if tolerated), fermented food, lean meat. Copious water  intake. Limit (ultra)processed foods and sugar-sweetened beverages.    Other: -Follow-up with your outpatient  psychiatric provider -instructions on appointment date, time, and address (location) are provided to you in discharge paperwork. Please establish with outpatient psychiatry including the [facility] 2nd floor open access clinic in this building.    -Take your neuropsychiatric medications as prescribed at discharge - instructions are provided to you in the discharge paperwork. Keppra  500mg  BID.   -Follow-up with outpatient primary care doctor to optimize health maintenance/preventative care and other specialists -for management of chronic medical disease. Infectious Disease and Neurology.  -Recommend total abstinence from alcohol , tobacco, and other illicit drug use at discharge. Although you plan to self-taper with alcohol , medical detoxification is more effective and safer. Please return to the [facility] or nearest ED if you change your mind.   -If your psychiatric symptoms recur, worsen, or if you have side effects to your psychiatric medications, call your outpatient psychiatric provider, 911, [phone] or go to the nearest emergency department.   -If suicidal thoughts occur, immediately call your outpatient psychiatric provider, 911, 988 or go to the nearest emergency department.  Disposition: community  KANDI JAYSON HAHN, MD 10/11/2024, 11:41 AM

## 2024-10-11 NOTE — ED Notes (Signed)
 Pt sleeping at present, no distress noted.  Monitoring for safety.

## 2024-10-11 NOTE — ED Notes (Signed)
 Pt A&O x 4, with walker assist, presents for Meth and alcohol  abuse.  Pt anxious and irritable.  Comfort measures given.  Monitoring for safety.

## 2024-10-11 NOTE — ED Notes (Signed)
 Patient awake and alert. Patient presents irritable demanding his medication and states I am going to have a seizure from withdrawal. Patient also refuses any librium  stating that one 25mg  is not enough. Patient states he cannot eat and refused breakfast this morning. Provider notified. Patient remains safe in unit with no major s/s of current distress.

## 2024-10-11 NOTE — ED Notes (Signed)
Pt awake & resting at present, no distress noted.  Monitoring for safety. 

## 2024-10-12 ENCOUNTER — Emergency Department (HOSPITAL_COMMUNITY): Payer: Self-pay

## 2024-10-12 ENCOUNTER — Emergency Department (HOSPITAL_COMMUNITY)
Admission: EM | Admit: 2024-10-12 | Discharge: 2024-10-13 | Disposition: A | Payer: Self-pay | Attending: Emergency Medicine | Admitting: Emergency Medicine

## 2024-10-12 ENCOUNTER — Encounter (HOSPITAL_COMMUNITY): Payer: Self-pay

## 2024-10-12 ENCOUNTER — Other Ambulatory Visit: Payer: Self-pay

## 2024-10-12 DIAGNOSIS — Y908 Blood alcohol level of 240 mg/100 ml or more: Secondary | ICD-10-CM | POA: Insufficient documentation

## 2024-10-12 DIAGNOSIS — F10920 Alcohol use, unspecified with intoxication, uncomplicated: Secondary | ICD-10-CM

## 2024-10-12 DIAGNOSIS — F10120 Alcohol abuse with intoxication, uncomplicated: Secondary | ICD-10-CM | POA: Insufficient documentation

## 2024-10-12 DIAGNOSIS — R0602 Shortness of breath: Secondary | ICD-10-CM | POA: Insufficient documentation

## 2024-10-12 LAB — COMPREHENSIVE METABOLIC PANEL WITH GFR
ALT: 47 U/L — ABNORMAL HIGH (ref 0–44)
AST: 58 U/L — ABNORMAL HIGH (ref 15–41)
Albumin: 3.1 g/dL — ABNORMAL LOW (ref 3.5–5.0)
Alkaline Phosphatase: 48 U/L (ref 38–126)
Anion gap: 15 (ref 5–15)
BUN: 12 mg/dL (ref 6–20)
CO2: 22 mmol/L (ref 22–32)
Calcium: 8.4 mg/dL — ABNORMAL LOW (ref 8.9–10.3)
Chloride: 108 mmol/L (ref 98–111)
Creatinine, Ser: 0.75 mg/dL (ref 0.61–1.24)
GFR, Estimated: >60 mL/min (ref >60)
Glucose, Bld: 121 mg/dL — ABNORMAL HIGH (ref 70–99)
Potassium: 3.9 mmol/L (ref 3.5–5.1)
Sodium: 145 mmol/L (ref 135–145)
Total Bilirubin: 0.3 mg/dL (ref 0.0–1.2)
Total Protein: 6.1 g/dL — ABNORMAL LOW (ref 6.5–8.1)

## 2024-10-12 LAB — CBC WITH DIFFERENTIAL/PLATELET
Abs Immature Granulocytes: 0.02 K/uL (ref 0.00–0.07)
Basophils Absolute: 0 K/uL (ref 0.0–0.1)
Basophils Relative: 1 %
Eosinophils Absolute: 0.1 K/uL (ref 0.0–0.5)
Eosinophils Relative: 2 %
HCT: 30.4 % — ABNORMAL LOW (ref 39.0–52.0)
Hemoglobin: 9.6 g/dL — ABNORMAL LOW (ref 13.0–17.0)
Immature Granulocytes: 1 %
Lymphocytes Relative: 48 %
Lymphs Abs: 1.4 K/uL (ref 0.7–4.0)
MCH: 31.4 pg (ref 26.0–34.0)
MCHC: 31.6 g/dL (ref 30.0–36.0)
MCV: 99.3 fL (ref 80.0–100.0)
Monocytes Absolute: 0.4 K/uL (ref 0.1–1.0)
Monocytes Relative: 13 %
Neutro Abs: 1 K/uL — ABNORMAL LOW (ref 1.7–7.7)
Neutrophils Relative %: 35 %
Platelets: 177 K/uL (ref 150–400)
RBC: 3.06 MIL/uL — ABNORMAL LOW (ref 4.22–5.81)
RDW: 19.8 % — ABNORMAL HIGH (ref 11.5–15.5)
WBC: 2.9 K/uL — ABNORMAL LOW (ref 4.0–10.5)
nRBC: 0.7 % — ABNORMAL HIGH (ref 0.0–0.2)

## 2024-10-12 LAB — ETHANOL: Alcohol, Ethyl (B): 377 mg/dL (ref <15)

## 2024-10-12 LAB — TROPONIN I (HIGH SENSITIVITY): Troponin I (High Sensitivity): 5 ng/L (ref <18)

## 2024-10-12 MED ORDER — CHLORDIAZEPOXIDE HCL 25 MG PO CAPS
100.0000 mg | ORAL_CAPSULE | Freq: Once | ORAL | Status: AC
  Administered 2024-10-12: 100 mg via ORAL
  Filled 2024-10-12: qty 4

## 2024-10-12 NOTE — Discharge Instructions (Signed)
 Your tests are reassuring.  Please follow-up with your doctor in the office.

## 2024-10-12 NOTE — ED Provider Notes (Signed)
 [name] EMERGENCY DEPARTMENT AT [facility] Provider Note   CSN: [id number] Arrival date & time: 10/12/24  2050     Patient presents with: Chest Pain and Alcohol  Problem   Gary [name] is a 38 y.o. male.   38 yo M with a chief complaint of difficulty breathing.  The patient said this started abruptly just before he came in.  He feels like he is having trouble getting air in.  Denies chest pain or pressure.  He also would like help quitting drinking alcohol .  He has tried this before but does not think that he is going to be successful without staying in the hospital.  He denies cough congestion or fever.   Chest Pain Alcohol  Problem Associated symptoms include chest pain.       Prior to Admission medications   Medication Sig Start Date End Date Taking? Authorizing Provider  bictegravir-emtricitabine -tenofovir  AF (BIKTARVY ) 50-200-25 MG TABS tablet Take 1 tablet by mouth daily. 09/21/24   Garrick Charleston, MD  levETIRAcetam  (KEPPRA ) 500 MG tablet Take 1 tablet (500 mg total) by mouth 2 (two) times daily. 09/21/24   Garrick Charleston, MD  lipase/protease/amylase (CREON ) 36000 UNITS CPEP capsule Take 1 capsule (36,000 Units total) by mouth 3 (three) times daily before meals. 05/26/24   Amin, Sumayya, MD  Multiple Vitamin (MULTIVITAMIN WITH MINERALS) TABS tablet Take 1 tablet by mouth daily. 10/12/24   Cole Kandi BROCKS, MD  sulfamethoxazole -trimethoprim  (BACTRIM  DS) 800-160 MG tablet Take 1 tablet by mouth every Monday, Wednesday, and Friday. 09/11/24 12/10/24  Dezii, Alexandra, DO  famotidine  (PEPCID ) 20 MG tablet Take 1 tablet (20 mg total) by mouth 2 (two) times daily. Patient not taking: Reported on 06/01/2019 05/14/19 06/02/19  [name], MD    Allergies: Risperidone  and paliperidone, Tegretol  [carbamazepine ], and Caffeine    Review of Systems  Cardiovascular:  Positive for chest pain.    Updated Vital Signs BP 106/64   Pulse (!) 109   Temp 98.4 F (36.9  C) (Oral)   Resp (!) 21   Ht 6' (1.829 m)   Wt 67.1 kg   SpO2 94%   BMI 20.07 kg/m   Physical Exam Vitals and nursing note reviewed.  Constitutional:      Appearance: He is well-developed.  HENT:     Head: Normocephalic and atraumatic.  Eyes:     Pupils: Pupils are equal, round, and reactive to light.  Neck:     Vascular: No JVD.  Cardiovascular:     Rate and Rhythm: Normal rate and regular rhythm.     Heart sounds: No murmur heard.    No friction rub. No gallop.  Pulmonary:     Effort: No respiratory distress.     Breath sounds: No wheezing.  Abdominal:     General: There is no distension.     Tenderness: There is no abdominal tenderness. There is no guarding or rebound.  Musculoskeletal:        General: Normal range of motion.     Cervical back: Normal range of motion and neck supple.  Skin:    Coloration: Skin is not pale.     Findings: No rash.  Neurological:     Mental Status: He is alert and oriented to person, place, and time.  Psychiatric:        Behavior: Behavior normal.     (all labs ordered are listed, but only abnormal results are displayed) Labs Reviewed  CBC WITH DIFFERENTIAL/PLATELET - Abnormal; Notable for the  following components:      Result Value   WBC 2.9 (*)    RBC 3.06 (*)    Hemoglobin 9.6 (*)    HCT 30.4 (*)    RDW 19.8 (*)    nRBC 0.7 (*)    Neutro Abs 1.0 (*)    All other components within normal limits  ETHANOL - Abnormal; Notable for the following components:   Alcohol , Ethyl (B) 377 (*)    All other components within normal limits  COMPREHENSIVE METABOLIC PANEL WITH GFR - Abnormal; Notable for the following components:   Glucose, Bld 121 (*)    Calcium  8.4 (*)    Total Protein 6.1 (*)    Albumin 3.1 (*)    AST 58 (*)    ALT 47 (*)    All other components within normal limits  TROPONIN I (HIGH SENSITIVITY)    EKG: None  Radiology: DG Chest Port 1 View Result Date: 10/12/2024 CLINICAL DATA:  Shortness of breath EXAM:  PORTABLE CHEST 1 VIEW COMPARISON:  Chest x-ray 08/15/2024 FINDINGS: The heart size and mediastinal contours are within normal limits. Both lungs are clear. There is mild elevation of the left hemidiaphragm. The visualized skeletal structures are unremarkable. IMPRESSION: No active disease. Electronically Signed   By: Greig Pique M.D.   On: 10/12/2024 21:34     Procedures   Medications Ordered in the ED  chlordiazePOXIDE  (LIBRIUM ) capsule 100 mg (100 mg Oral Given 10/12/24 2246)                                    Medical Decision Making Amount and/or Complexity of Data Reviewed Labs: ordered. Radiology: ordered.  Risk Prescription drug management.   38 yo M well-known to this emergency department with 48 visits in the past 6 months comes in with a chief complaints of difficulty breathing and wanting assistance from alcoholism.  Will obtain a chest x-ray round of blood work.  Chest x-ray independently interpreted by me without focal infiltrate or pneumothorax.  No significant anemia intoxicated.  No significant electrolyte abnormalities.  Will discharge home.  PCP follow-up.  11:52 PM:  I have discussed the diagnosis/risks/treatment options with the patient.  Evaluation and diagnostic testing in the emergency department does not suggest an emergent condition requiring admission or immediate intervention beyond what has been performed at this time.  They will follow up with PCP. We also discussed returning to the ED immediately if new or worsening sx occur. We discussed the sx which are most concerning (e.g., sudden worsening pain, fever, inability to tolerate by mouth) that necessitate immediate return. Medications administered to the patient during their visit and any new prescriptions provided to the patient are listed below.  Medications given during this visit Medications  chlordiazePOXIDE  (LIBRIUM ) capsule 100 mg (100 mg Oral Given 10/12/24 2246)     The patient appears  reasonably screen and/or stabilized for discharge and I doubt any other medical condition or other [facility] requiring further screening, evaluation, or treatment in the ED at this time prior to discharge.       Final diagnoses:  Alcoholic intoxication without complication  Shortness of breath    ED Discharge Orders     None          Emil Share, DO 10/12/24 2352

## 2024-10-12 NOTE — ED Triage Notes (Signed)
 Pt coming in from [employer] [address]. Pt had some confusion. Pt had syncopal episode earlier today but doesn't know if he hit his head. Pt spo2 was 90 on ra. PT reporting chest pain. Pt his hiv positive. Pt complaining  of headache. Pt reporting chest tightness. Etoh use endorsed by pt. Pt says that he is scared to death.

## 2024-10-12 NOTE — ED Triage Notes (Signed)
 Pt reports last drink at around 7pm today

## 2024-10-12 NOTE — ED Notes (Signed)
 Pt requested sandwhich and drink. This NT messaged provider and provider gave permission for food and drink. Sandwhich and apple juice given to the pt.

## 2024-10-13 NOTE — Progress Notes (Signed)
 Inactivating as clinic cannot reach patient for scheduling.  Alan Geralds, PharmD, CPP, BCIDP, AAHIVP Clinical Pharmacist Practitioner Infectious Diseases Clinical Pharmacist [facility]

## 2024-10-13 NOTE — ED Notes (Signed)
 Pt verbalized understanding of discharge instructions. Pt ambulatory at time of discharge.

## 2024-10-21 ENCOUNTER — Other Ambulatory Visit: Payer: Self-pay

## 2024-10-21 ENCOUNTER — Emergency Department (HOSPITAL_COMMUNITY)
Admission: EM | Admit: 2024-10-21 | Discharge: 2024-10-21 | Disposition: A | Discharge status: Home / Self Care | Payer: Self-pay | Attending: Emergency Medicine | Admitting: Emergency Medicine

## 2024-10-21 DIAGNOSIS — Z5900 Homelessness unspecified: Secondary | ICD-10-CM | POA: Insufficient documentation

## 2024-10-21 DIAGNOSIS — T730XXA Starvation, initial encounter: Secondary | ICD-10-CM | POA: Insufficient documentation

## 2024-10-21 NOTE — ED Provider Notes (Signed)
°  [place] EMERGENCY DEPARTMENT AT [facility] Provider Note   CSN: [id number] Arrival date & time: 10/21/24  8177     Patient presents with: Homeless   Gary [name] is a 38 y.o. male.   HPI Patient presents with hunger, wanting to get out of the cold. Patient is awake and alert, acknowledges homelessness, prior alcohol  abuse, no description of new pain, other complaints, no report of seizure, fall, trauma.    Prior to Admission medications  Medication Sig Start Date End Date Taking? Authorizing Provider  bictegravir-emtricitabine -tenofovir  AF (BIKTARVY ) 50-200-25 MG TABS tablet Take 1 tablet by mouth daily. 09/21/24   Garrick Charleston, MD  levETIRAcetam  (KEPPRA ) 500 MG tablet Take 1 tablet (500 mg total) by mouth 2 (two) times daily. 09/21/24   Garrick Charleston, MD  lipase/protease/amylase (CREON ) 36000 UNITS CPEP capsule Take 1 capsule (36,000 Units total) by mouth 3 (three) times daily before meals. 05/26/24   Amin, Sumayya, MD  Multiple Vitamin (MULTIVITAMIN WITH MINERALS) TABS tablet Take 1 tablet by mouth daily. 10/12/24   Cole Kandi BROCKS, MD  sulfamethoxazole -trimethoprim  (BACTRIM  DS) 800-160 MG tablet Take 1 tablet by mouth every Monday, Wednesday, and Friday. 09/11/24 12/10/24  Dezii, Alexandra, DO  famotidine  (PEPCID ) 20 MG tablet Take 1 tablet (20 mg total) by mouth 2 (two) times daily. Patient not taking: Reported on 06/01/2019 05/14/19 06/02/19  [name], MD    Allergies: Risperidone  and paliperidone, Tegretol  [carbamazepine ], and Caffeine    Review of Systems  Updated Vital Signs BP 104/77   Pulse 85   Temp 97.9 F (36.6 C) (Oral)   Resp 16   SpO2 100%   Physical Exam Vitals and nursing note reviewed.  Constitutional:      General: He is not in acute distress.    Appearance: He is well-developed.  HENT:     Head: Normocephalic and atraumatic.  Eyes:     Conjunctiva/sclera: Conjunctivae normal.  Cardiovascular:     Rate and Rhythm:  Normal rate and regular rhythm.  Pulmonary:     Effort: Pulmonary effort is normal.  Abdominal:     General: There is no distension.  Musculoskeletal:        General: No deformity.  Skin:    General: Skin is warm and dry.  Neurological:     Mental Status: He is alert and oriented to person, place, and time.     (all labs ordered are listed, but only abnormal results are displayed) Labs Reviewed - No data to display  EKG: None  Radiology: No results found.   Procedures   Medications Ordered in the ED - No data to display                                  Medical Decision Making Adult male well-known to the staff at this facility presents for the 40 of them in 6 months.  Patient currently has concerns for homelessness, being cold, and hunger. EKG reviewed, no overt acute changes, on monitors patient has no hypothermia, hemodynamic instability, no evidence for decompensated state, patient provided food, warm blankets, discharged to follow-up with shelter.    Final diagnoses:  Homeless    ED Discharge Orders     None          Garrick Charleston, MD 10/21/24 2041

## 2024-10-21 NOTE — Discharge Instructions (Addendum)
 Use your previously obtained resources and follow-up at one of the local homeless shelters and with our community care clinic.

## 2024-10-21 NOTE — ED Triage Notes (Signed)
 Pt c/o alcohol  detox, body aches, 'heart pain,' and being cold. No withdrawal symptoms noted, EKG completed.

## 2024-10-22 ENCOUNTER — Emergency Department (HOSPITAL_COMMUNITY)
Admission: EM | Admit: 2024-10-22 | Discharge: 2024-10-23 | Disposition: A | Payer: Self-pay | Attending: Emergency Medicine | Admitting: Emergency Medicine

## 2024-10-22 ENCOUNTER — Other Ambulatory Visit: Payer: Self-pay

## 2024-10-22 ENCOUNTER — Encounter (HOSPITAL_COMMUNITY): Payer: Self-pay | Admitting: Emergency Medicine

## 2024-10-22 DIAGNOSIS — F10920 Alcohol use, unspecified with intoxication, uncomplicated: Secondary | ICD-10-CM

## 2024-10-22 DIAGNOSIS — R000 Tachycardia, unspecified: Secondary | ICD-10-CM | POA: Insufficient documentation

## 2024-10-22 DIAGNOSIS — F10120 Alcohol abuse with intoxication, uncomplicated: Secondary | ICD-10-CM | POA: Insufficient documentation

## 2024-10-22 NOTE — ED Provider Notes (Signed)
°  [place] EMERGENCY DEPARTMENT AT [facility] Provider Assume Care Note I assumed care of Gary [name] on 10/22/2024 at 11 PM from Dr. Dasie.   Briefly, Gary [name] is a 38 y.o. male who: PMHx: Alcohol  use disorder, HIV, polysubstance use disorder, pancytopenia, bipolar 2  P/w intoxication     Plan at the time of handoff: Tablets to clinical sobriety, reevaluate, likely discharge thereafter   Please refer to the original providers note for additional information regarding the care of Gary [name].  Reassessment: I personally reassessed the patient: ***Patient without any acute complaints or additional questions. ***Patient complained of ***.   Vital Signs:  ED Triage Vitals  Encounter Vitals Group     BP 10/22/24 2133 123/67     Girls Systolic BP Percentile --      Girls Diastolic BP Percentile --      Boys Systolic BP Percentile --      Boys Diastolic BP Percentile --      Pulse Rate 10/22/24 2133 (!) 116     Resp 10/22/24 2146 17     Temp 10/22/24 2146 97.8 F (36.6 C)     Temp src --      SpO2 10/22/24 2133 97 %     Weight --      Height --      Head Circumference --      Peak Flow --      Pain Score --      Pain Loc --      Pain Education --      Exclude from Growth Chart --      Hemodynamics:  The patient is ***hemodynamically stable. Mental Status:  The patient is ***alert  Additional MDM: ***  Disposition: {ODIPDEN:66611}   FREDRIK CANDIE Later, MD Emergency Medicine

## 2024-10-22 NOTE — ED Notes (Signed)
 Pt ambulatory, carrying his walker over his head.

## 2024-10-22 NOTE — ED Triage Notes (Signed)
 BIB EMS from [place].  Pt appeared intoxicated and bystander called EMS

## 2024-10-22 NOTE — ED Provider Notes (Signed)
 [place] EMERGENCY DEPARTMENT AT [facility] Provider Note   CSN: [id number] Arrival date & time: 10/22/24  2119     Patient presents with: Alcohol  Intoxication   Gary [name] is a 38 y.o. male.   38 year old male well-known to me with history of homelessness and alcohol  abuse presents from [facility] after being there all day and being told to leave when he close.  Per EMS who I spoke with patient admits to drinking alcohol  this evening.  Patient states that he did drink copious amounts of wine.  Denies any SI or HI at this time.  Request help for alcohol  detox       Prior to Admission medications  Medication Sig Start Date End Date Taking? Authorizing Provider  bictegravir-emtricitabine -tenofovir  AF (BIKTARVY ) 50-200-25 MG TABS tablet Take 1 tablet by mouth daily. 09/21/24   Garrick Charleston, MD  levETIRAcetam  (KEPPRA ) 500 MG tablet Take 1 tablet (500 mg total) by mouth 2 (two) times daily. 09/21/24   Garrick Charleston, MD  lipase/protease/amylase (CREON ) 36000 UNITS CPEP capsule Take 1 capsule (36,000 Units total) by mouth 3 (three) times daily before meals. 05/26/24   Amin, Sumayya, MD  Multiple Vitamin (MULTIVITAMIN WITH MINERALS) TABS tablet Take 1 tablet by mouth daily. 10/12/24   Cole Kandi BROCKS, MD  sulfamethoxazole -trimethoprim  (BACTRIM  DS) 800-160 MG tablet Take 1 tablet by mouth every Monday, Wednesday, and Friday. 09/11/24 12/10/24  Dezii, Alexandra, DO  famotidine  (PEPCID ) 20 MG tablet Take 1 tablet (20 mg total) by mouth 2 (two) times daily. Patient not taking: Reported on 06/01/2019 05/14/19 06/02/19  [name], MD    Allergies: Risperidone  and paliperidone, Tegretol  [carbamazepine ], and Caffeine    Review of Systems  All other systems reviewed and are negative.   Updated Vital Signs BP 123/67   Pulse (!) 116   SpO2 97%   Physical Exam Vitals and nursing note reviewed.  Constitutional:      General: He is not in acute distress.     Appearance: Normal appearance. He is well-developed. He is not toxic-appearing.  HENT:     Head: Normocephalic and atraumatic.  Eyes:     General: Lids are normal.     Conjunctiva/sclera: Conjunctivae normal.     Pupils: Pupils are equal, round, and reactive to light.  Neck:     Thyroid : No thyroid  mass.     Trachea: No tracheal deviation.  Cardiovascular:     Rate and Rhythm: Regular rhythm. Tachycardia present.     Heart sounds: Normal heart sounds. No murmur heard.    No gallop.  Pulmonary:     Effort: Pulmonary effort is normal. No respiratory distress.     Breath sounds: Normal breath sounds. No stridor. No decreased breath sounds, wheezing, rhonchi or rales.  Abdominal:     General: There is no distension.     Palpations: Abdomen is soft.     Tenderness: There is no abdominal tenderness. There is no rebound.  Musculoskeletal:        General: No tenderness. Normal range of motion.     Cervical back: Normal range of motion and neck supple.  Skin:    General: Skin is warm and dry.     Findings: No abrasion or rash.  Neurological:     Mental Status: He is alert and oriented to person, place, and time. Mental status is at baseline.     GCS: GCS eye subscore is 4. GCS verbal subscore is 5. GCS motor subscore is 6.  Cranial Nerves: No cranial nerve deficit.     Sensory: No sensory deficit.     Motor: Motor function is intact.  Psychiatric:        Attention and Perception: Attention normal.        Speech: Speech normal.        Behavior: Behavior normal.     (all labs ordered are listed, but only abnormal results are displayed) Labs Reviewed - No data to display  EKG: None  Radiology: No results found.   Procedures   Medications Ordered in the ED - No data to display                                  Medical Decision Making  Patient here requesting alcohol  detox.  He appears to be clinically intoxicated at this time.  Has no evidence of withdrawal.  Patient  has been in multiple rehab facilities at this time.  He has no acute psychiatric emergency at this time.  Will allow patient to sober up and give patient outpatient resources.  Will sign out to next provider     Final diagnoses:  None    ED Discharge Orders     None          [name], MD 10/22/24 2148

## 2024-10-23 ENCOUNTER — Emergency Department (HOSPITAL_COMMUNITY)
Admission: EM | Admit: 2024-10-23 | Discharge: 2024-10-23 | Disposition: A | Discharge status: Home / Self Care | Payer: MEDICAID | Attending: Emergency Medicine | Admitting: Emergency Medicine

## 2024-10-23 DIAGNOSIS — Z21 Asymptomatic human immunodeficiency virus [HIV] infection status: Secondary | ICD-10-CM | POA: Diagnosis not present

## 2024-10-23 DIAGNOSIS — F1010 Alcohol abuse, uncomplicated: Secondary | ICD-10-CM | POA: Diagnosis present

## 2024-10-23 DIAGNOSIS — Y909 Presence of alcohol in blood, level not specified: Secondary | ICD-10-CM | POA: Diagnosis not present

## 2024-10-23 MED ORDER — CHLORDIAZEPOXIDE HCL 25 MG PO CAPS
50.0000 mg | ORAL_CAPSULE | Freq: Once | ORAL | Status: AC
  Administered 2024-10-23: 19:00:00 50 mg via ORAL
  Filled 2024-10-23: qty 2

## 2024-10-23 NOTE — Discharge Instructions (Signed)
 Please follow-up with your primary care provider.  You will also likely need to follow-up with detox to help with your alcohol  abuse.  Return to the ER for any new or worsening symptoms.

## 2024-10-23 NOTE — ED Triage Notes (Signed)
 Patient from [place] stating he has had only two bottles of wine today which is not enough to keep him from withdrawing. Last drink within two hours of arrival.

## 2024-10-23 NOTE — ED Provider Notes (Signed)
 [place] EMERGENCY DEPARTMENT AT [facility] Provider Note   CSN: [id number] Arrival date & time: 10/23/24  8191     Patient presents with: Alcohol  Problem   Gary [name] is a 38 y.o. male.   HPI 38 year old male with a history of chronic pancreatitis, HIV, chronic alcohol  abuse, schizophrenia, and other comorbidities presents feeling like he is going going to withdrawal.  He states he has only drank 2 bottles of wine today and that was earlier in the morning which is much too low for him.  He states that he is worried he is going to start withdrawing and feels like he is having some shaking now.  No vomiting.  No new abdominal pain.  Prior to Admission medications  Medication Sig Start Date End Date Taking? Authorizing Provider  bictegravir-emtricitabine -tenofovir  AF (BIKTARVY ) 50-200-25 MG TABS tablet Take 1 tablet by mouth daily. 09/21/24   Garrick Charleston, MD  levETIRAcetam  (KEPPRA ) 500 MG tablet Take 1 tablet (500 mg total) by mouth 2 (two) times daily. 09/21/24   Garrick Charleston, MD  lipase/protease/amylase (CREON ) 36000 UNITS CPEP capsule Take 1 capsule (36,000 Units total) by mouth 3 (three) times daily before meals. 05/26/24   Amin, Sumayya, MD  Multiple Vitamin (MULTIVITAMIN WITH MINERALS) TABS tablet Take 1 tablet by mouth daily. 10/12/24   Cole Kandi BROCKS, MD  sulfamethoxazole -trimethoprim  (BACTRIM  DS) 800-160 MG tablet Take 1 tablet by mouth every Monday, Wednesday, and Friday. 09/11/24 12/10/24  Dezii, Alexandra, DO  famotidine  (PEPCID ) 20 MG tablet Take 1 tablet (20 mg total) by mouth 2 (two) times daily. Patient not taking: Reported on 06/01/2019 05/14/19 06/02/19  [name], MD    Allergies: Risperidone  and paliperidone, Tegretol  [carbamazepine ], and Caffeine    Review of Systems  Gastrointestinal:  Negative for vomiting.  Neurological:  Positive for tremors.    Updated Vital Signs BP (!) 134/100 (BP Location: Left Arm)   Pulse 94   Temp 98.1  F (36.7 C) (Oral)   Resp (!) 22   SpO2 96%   Physical Exam Vitals and nursing note reviewed.  Constitutional:      General: He is not in acute distress.    Appearance: He is well-developed. He is not ill-appearing or diaphoretic.  HENT:     Head: Normocephalic and atraumatic.  Cardiovascular:     Rate and Rhythm: Normal rate and regular rhythm.     Heart sounds: Normal heart sounds.  Pulmonary:     Effort: Pulmonary effort is normal.  Abdominal:     General: There is no distension.  Skin:    General: Skin is warm and dry.  Neurological:     Mental Status: He is alert.     Comments: No significant tremor noted.     (all labs ordered are listed, but only abnormal results are displayed) Labs Reviewed - No data to display  EKG: None  Radiology: No results found.   Procedures   Medications Ordered in the ED  chlordiazePOXIDE  (LIBRIUM ) capsule 50 mg (has no administration in time range)                                    Medical Decision Making Risk Prescription drug management.   Patient presents with concern for alcohol  withdrawal.  On my exam he does not have any significant tremors and his vital signs are reassuring and not consistent with alcohol  withdrawal.  I do not  think he needs further workup or treatment in the emergency department besides a dose of Librium  to help prevent withdrawal. Otherwise, stable for discharge.     Final diagnoses:  Alcohol  abuse    ED Discharge Orders     None          Freddi Hamilton, MD 10/23/24 [phone]

## 2024-10-23 NOTE — Discharge Instructions (Signed)
 Gary [name]  Thank you for allowing us  to take care of you today.  You came to the Emergency Department today because you came to the emergency department after drinking.  We watched you in the emergency department until you were sober, you are able to walk with your walker, and tolerate fluids by mouth, therefore you are safe to follow-up outpatient.   To-Do: 1. Please follow-up with your primary doctor within 1 - 2 weeks / as soon as possible.   Please return to the Emergency Department or call 911 if you experience have worsening of your symptoms, or do not get better, chest pain, shortness of breath, severe or significantly worsening pain, high fever, severe confusion, pass out or have any reason to think that you need emergency medical care.   We hope you feel better soon.   Department of Emergency Medicine [facility] [place]

## 2024-10-29 ENCOUNTER — Inpatient Hospital Stay (HOSPITAL_COMMUNITY)
Admission: EM | Admit: 2024-10-29 | Discharge: 2024-11-08 | DRG: 896 | Disposition: A | Discharge status: Home / Self Care | Payer: MEDICAID | Attending: Family Medicine | Admitting: Family Medicine

## 2024-10-29 ENCOUNTER — Other Ambulatory Visit: Payer: Self-pay

## 2024-10-29 ENCOUNTER — Emergency Department (HOSPITAL_COMMUNITY): Payer: MEDICAID

## 2024-10-29 DIAGNOSIS — F4310 Post-traumatic stress disorder, unspecified: Secondary | ICD-10-CM | POA: Diagnosis present

## 2024-10-29 DIAGNOSIS — M8580 Other specified disorders of bone density and structure, unspecified site: Secondary | ICD-10-CM | POA: Diagnosis present

## 2024-10-29 DIAGNOSIS — G8929 Other chronic pain: Secondary | ICD-10-CM | POA: Diagnosis present

## 2024-10-29 DIAGNOSIS — J690 Pneumonitis due to inhalation of food and vomit: Secondary | ICD-10-CM | POA: Diagnosis present

## 2024-10-29 DIAGNOSIS — R7881 Bacteremia: Secondary | ICD-10-CM | POA: Insufficient documentation

## 2024-10-29 DIAGNOSIS — E876 Hypokalemia: Secondary | ICD-10-CM | POA: Diagnosis present

## 2024-10-29 DIAGNOSIS — G40909 Epilepsy, unspecified, not intractable, without status epilepticus: Secondary | ICD-10-CM | POA: Diagnosis present

## 2024-10-29 DIAGNOSIS — R6510 Systemic inflammatory response syndrome (SIRS) of non-infectious origin without acute organ dysfunction: Secondary | ICD-10-CM | POA: Diagnosis present

## 2024-10-29 DIAGNOSIS — Z21 Asymptomatic human immunodeficiency virus [HIV] infection status: Secondary | ICD-10-CM | POA: Diagnosis present

## 2024-10-29 DIAGNOSIS — F319 Bipolar disorder, unspecified: Secondary | ICD-10-CM | POA: Diagnosis present

## 2024-10-29 DIAGNOSIS — Z811 Family history of alcohol abuse and dependence: Secondary | ICD-10-CM

## 2024-10-29 DIAGNOSIS — Z5900 Homelessness unspecified: Secondary | ICD-10-CM

## 2024-10-29 DIAGNOSIS — F10939 Alcohol use, unspecified with withdrawal, unspecified: Secondary | ICD-10-CM | POA: Diagnosis present

## 2024-10-29 DIAGNOSIS — F17200 Nicotine dependence, unspecified, uncomplicated: Secondary | ICD-10-CM | POA: Diagnosis present

## 2024-10-29 DIAGNOSIS — Z87898 Personal history of other specified conditions: Secondary | ICD-10-CM

## 2024-10-29 DIAGNOSIS — B9689 Other specified bacterial agents as the cause of diseases classified elsewhere: Secondary | ICD-10-CM | POA: Diagnosis present

## 2024-10-29 DIAGNOSIS — Z8249 Family history of ischemic heart disease and other diseases of the circulatory system: Secondary | ICD-10-CM

## 2024-10-29 DIAGNOSIS — K861 Other chronic pancreatitis: Secondary | ICD-10-CM | POA: Diagnosis present

## 2024-10-29 DIAGNOSIS — F10229 Alcohol dependence with intoxication, unspecified: Secondary | ICD-10-CM | POA: Diagnosis present

## 2024-10-29 DIAGNOSIS — F3181 Bipolar II disorder: Secondary | ICD-10-CM | POA: Diagnosis present

## 2024-10-29 DIAGNOSIS — F17210 Nicotine dependence, cigarettes, uncomplicated: Secondary | ICD-10-CM | POA: Diagnosis present

## 2024-10-29 DIAGNOSIS — K852 Alcohol induced acute pancreatitis without necrosis or infection: Principal | ICD-10-CM | POA: Diagnosis present

## 2024-10-29 DIAGNOSIS — Z800 Family history of malignant neoplasm of digestive organs: Secondary | ICD-10-CM

## 2024-10-29 DIAGNOSIS — D696 Thrombocytopenia, unspecified: Secondary | ICD-10-CM | POA: Diagnosis present

## 2024-10-29 DIAGNOSIS — Z6372 Alcoholism and drug addiction in family: Secondary | ICD-10-CM

## 2024-10-29 DIAGNOSIS — F209 Schizophrenia, unspecified: Secondary | ICD-10-CM | POA: Diagnosis present

## 2024-10-29 DIAGNOSIS — Z888 Allergy status to other drugs, medicaments and biological substances status: Secondary | ICD-10-CM

## 2024-10-29 DIAGNOSIS — Y901 Blood alcohol level of 20-39 mg/100 ml: Secondary | ICD-10-CM | POA: Diagnosis present

## 2024-10-29 DIAGNOSIS — Z1152 Encounter for screening for COVID-19: Secondary | ICD-10-CM

## 2024-10-29 DIAGNOSIS — F10239 Alcohol dependence with withdrawal, unspecified: Principal | ICD-10-CM | POA: Diagnosis present

## 2024-10-29 DIAGNOSIS — Z79899 Other long term (current) drug therapy: Secondary | ICD-10-CM

## 2024-10-29 DIAGNOSIS — Z91148 Patient's other noncompliance with medication regimen for other reason: Secondary | ICD-10-CM

## 2024-10-29 DIAGNOSIS — B20 Human immunodeficiency virus [HIV] disease: Secondary | ICD-10-CM | POA: Diagnosis present

## 2024-10-29 DIAGNOSIS — E8729 Other acidosis: Secondary | ICD-10-CM | POA: Diagnosis present

## 2024-10-29 DIAGNOSIS — E872 Acidosis, unspecified: Secondary | ICD-10-CM | POA: Diagnosis present

## 2024-10-29 DIAGNOSIS — F411 Generalized anxiety disorder: Secondary | ICD-10-CM | POA: Diagnosis present

## 2024-10-29 LAB — COMPREHENSIVE METABOLIC PANEL WITH GFR
ALT: 44 U/L (ref 0–44)
AST: 48 U/L — ABNORMAL HIGH (ref 15–41)
Albumin: 4.4 g/dL (ref 3.5–5.0)
Alkaline Phosphatase: 138 U/L — ABNORMAL HIGH (ref 38–126)
Anion gap: 23 — ABNORMAL HIGH (ref 5–15)
BUN: 8 mg/dL (ref 6–20)
CO2: 20 mmol/L — ABNORMAL LOW (ref 22–32)
Calcium: 9.3 mg/dL (ref 8.9–10.3)
Chloride: 98 mmol/L (ref 98–111)
Creatinine, Ser: 0.61 mg/dL (ref 0.61–1.24)
GFR, Estimated: >60 mL/min
Glucose, Bld: 115 mg/dL — ABNORMAL HIGH (ref 70–99)
Potassium: 4 mmol/L (ref 3.5–5.1)
Sodium: 141 mmol/L (ref 135–145)
Total Bilirubin: 0.4 mg/dL (ref 0.0–1.2)
Total Protein: 8.1 g/dL (ref 6.5–8.1)

## 2024-10-29 LAB — RESP PANEL BY RT-PCR (RSV, FLU A&B, COVID)  RVPGX2
Influenza A by PCR: NEGATIVE
Influenza B by PCR: NEGATIVE
Resp Syncytial Virus by PCR: NEGATIVE
SARS Coronavirus 2 by RT PCR: NEGATIVE

## 2024-10-29 LAB — CBC
HCT: 42 % (ref 39.0–52.0)
Hemoglobin: 13.8 g/dL (ref 13.0–17.0)
MCH: 30.8 pg (ref 26.0–34.0)
MCHC: 32.9 g/dL (ref 30.0–36.0)
MCV: 93.8 fL (ref 80.0–100.0)
Platelets: 136 K/uL — ABNORMAL LOW (ref 150–400)
RBC: 4.48 MIL/uL (ref 4.22–5.81)
RDW: 17.2 % — ABNORMAL HIGH (ref 11.5–15.5)
WBC: 12.2 K/uL — ABNORMAL HIGH (ref 4.0–10.5)
nRBC: 0 % (ref 0.0–0.2)

## 2024-10-29 LAB — LIPASE, BLOOD: Lipase: 282 U/L — ABNORMAL HIGH (ref 11–51)

## 2024-10-29 MED ORDER — SODIUM CHLORIDE 0.9 % IV BOLUS
1000.0000 mL | Freq: Once | INTRAVENOUS | Status: AC
  Administered 2024-10-30: 1000 mL via INTRAVENOUS

## 2024-10-29 MED ORDER — HYDROMORPHONE HCL 1 MG/ML IJ SOLN
0.5000 mg | INTRAMUSCULAR | Status: DC | PRN
  Administered 2024-10-30 (×2): 0.5 mg via INTRAVENOUS
  Filled 2024-10-29 (×2): qty 1

## 2024-10-29 MED ORDER — PROCHLORPERAZINE EDISYLATE 10 MG/2ML IJ SOLN
10.0000 mg | Freq: Once | INTRAMUSCULAR | Status: AC
  Administered 2024-10-30: 10 mg via INTRAVENOUS
  Filled 2024-10-29: qty 2

## 2024-10-29 MED ORDER — IOHEXOL 300 MG/ML  SOLN
100.0000 mL | Freq: Once | INTRAMUSCULAR | Status: AC | PRN
  Administered 2024-10-29: 100 mL via INTRAVENOUS

## 2024-10-29 NOTE — ED Triage Notes (Signed)
 Patient BIB EMS from home c/o abdominal pain and cough x 1 day.  Etoh on board. Patient denies N/V/D. Hx pancreatitis.

## 2024-10-29 NOTE — ED Provider Notes (Signed)
 " [place] [facility] DEPARTMENT AT [facility] Provider Note  CSN: [id number] Arrival date & time: 10/29/24 2024  Chief Complaint(s) Abdominal Pain and Cough  History provided by patient. HPI & MDM Gary [name] is a 38 y.o. male here for:   Abdominal Pain Associated symptoms: cough   Cough   Clinical Course as of 10/30/24 0144  Mon Oct 30, 2024  0128 Patient has a past medical history of alcohol  use disorder and recurrent pancreatitis is here for several days of epigastric abdominal pain consistent with his previous pancreatitis pain.  Patient endorses nausea and emesis.  Additionally patient's endorsing coughing.  No reported fevers.  No diarrhea.  Patient has been unable to tolerate p.o. over the past several hours.  States that he last drank alcohol  in mid afternoon.  [PC]  0130 Differential diagnosis considered.  Workup below.  CBC with leukocytosis.  No anemia. CMP without significant electrolyte derangements.  Mild hyperglycemia without DKA.  No renal insufficiency.  Mild transaminitis without bili obstruction. Lipase of 282 consistent with likely acute pancreatitis which was confirmed by CT scan.  Patient also noted to have multifocal pneumonia in the right base.  Possible aspiration.  Patient provided with IV fluids, antiemetics and pain medicine.  Will initiate antibiotics and admit for further management.  [PC]    Clinical Course User Index [PC] Perfecto Purdy, Raynell Moder, MD   Medical Decision Making Amount and/or Complexity of Data Reviewed Labs: ordered. Decision-making details documented in ED Course. Radiology: ordered and independent interpretation performed. Decision-making details documented in ED Course.  Risk Prescription drug management. Parenteral controlled substances. Decision regarding hospitalization. Diagnosis or treatment significantly limited by social determinants of health. Risk Details: Homelessness, alcohol  use disorder     Final Clinical Impression(s) / ED Diagnoses Final diagnoses:  Alcohol -induced acute pancreatitis without infection or necrosis  Aspiration pneumonia of right lower lobe, unspecified aspiration pneumonia type [facility])     Past Medical History Past Medical History:  Diagnosis Date   Alcohol  abuse    Alcohol -induced pancreatitis 04/16/2022   Anxiety    Bipolar 2 disorder (HCC)    HIV (human immunodeficiency virus infection) (HCC)    Pancreatitis    PTSD (post-traumatic stress disorder)    Schizophrenia (HCC)    Seizures (HCC)    Subdural hematoma (HCC)    Patient Active Problem List   Diagnosis Date Noted   AIDS (HCC) 09/08/2024   Alcohol  withdrawal seizure (HCC) 09/06/2024   Hyponatremia 08/02/2024   Acute pancreatitis 06/23/2024   Depression with anxiety 05/21/2024   GERD (gastroesophageal reflux disease) 05/21/2024   Hypercalcemia 05/21/2024   Dehydration 05/19/2024   Heat exposure 05/19/2024   Epigastric pain 05/09/2024   SIRS (systemic inflammatory response syndrome) (HCC) 05/09/2024   Bipolar 2 disorder (HCC) 05/09/2024   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   Brief psychotic disorder (HCC) 04/20/2024   Hypotension 03/31/2024   High risk for readmission 03/01/2024   Alcohol  withdrawal (HCC) 02/25/2024   Severe protein-calorie malnutrition 02/25/2024   Alcohol  induced acute pancreatitis 02/14/2024   Pancreatic lesion 02/13/2024   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/2024   Ileus (HCC) 12/25/2023   PTSD (post-traumatic stress disorder) 12/25/2023   Generalized anxiety disorder 12/20/2023   Pancreatitis, necrotizing 12/17/2023   Tobacco use disorder 12/17/2023   Acute alcoholic pancreatitis 12/10/2023   Acute on chronic anemia 12/05/2023   Seizure (HCC) 11/19/2023   Gastritis and duodenitis 11/19/2023   Nausea and vomiting 11/01/2023   Chronic pancreatitis (HCC) 10/16/2023   Acute  on chronic pancreatitis (HCC) 10/16/2023   Alcoholic fatty liver 09/25/2023    Hypomagnesemia 12/24/2022   Seizure disorder (HCC) 12/17/2022   Pancytopenia (HCC) 12/14/2022   Alcohol -induced pancreatitis 12/14/2022   Delirium tremens (HCC) 10/19/2022   Alcohol  dependence with withdrawal (HCC) 10/16/2022   Homelessness 05/28/2022   Metabolic acidosis, increased anion gap 04/16/2022   Hypokalemia 01/22/2022   Constipation 07/30/2021   Severe neutropenia 07/26/2021   Alcohol  abuse    Malingering 05/15/2020   Thrombocytopenia 07/25/2019   Alcohol  intoxication with delirium 07/02/2019   Alcohol -induced mood disorder (HCC) 05/13/2019   Alcohol  use disorder, severe, dependence (HCC) 04/16/2019   Severe episode of recurrent major depressive disorder, without psychotic features (HCC) 04/16/2019   HIV disease (HCC) 06/16/2018   Polysubstance abuse (HCC) 06/16/2018   Nicotine  dependence, cigarettes, uncomplicated 06/16/2018   Home Medication(s) Prior to Admission medications  Medication Sig Start Date End Date Taking? Authorizing Provider  bictegravir-emtricitabine -tenofovir  AF (BIKTARVY ) 50-200-25 MG TABS tablet Take 1 tablet by mouth daily. 09/21/24   Garrick Charleston, MD  levETIRAcetam  (KEPPRA ) 500 MG tablet Take 1 tablet (500 mg total) by mouth 2 (two) times daily. 09/21/24   Garrick Charleston, MD  lipase/protease/amylase (CREON ) 36000 UNITS CPEP capsule Take 1 capsule (36,000 Units total) by mouth 3 (three) times daily before meals. 05/26/24   Amin, Sumayya, MD  Multiple Vitamin (MULTIVITAMIN WITH MINERALS) TABS tablet Take 1 tablet by mouth daily. 10/12/24   Cole Kandi BROCKS, MD  sulfamethoxazole -trimethoprim  (BACTRIM  DS) 800-160 MG tablet Take 1 tablet by mouth every Monday, Wednesday, and Friday. 09/11/24 12/10/24  Dezii, Alexandra, DO  famotidine  (PEPCID ) 20 MG tablet Take 1 tablet (20 mg total) by mouth 2 (two) times daily. Patient not taking: Reported on 06/01/2019 05/14/19 06/02/19  [name], MD                                                                                                                                     Allergies Risperidone  and paliperidone, Tegretol  [carbamazepine ], and Caffeine  Review of Systems Review of Systems  Respiratory:  Positive for cough.   Gastrointestinal:  Positive for abdominal pain.   As noted in HPI  Physical Exam Vital Signs  I have reviewed the triage vital signs BP 118/63   Pulse (!) 114   Temp 99.2 F (37.3 C) (Oral)   Resp 20   SpO2 90%   Physical Exam Vitals reviewed.  Constitutional:      General: He is not in acute distress.    Appearance: He is well-developed. He is not diaphoretic.  HENT:     Head: Normocephalic and atraumatic.     Right Ear: External ear normal.     Left Ear: External ear normal.     Nose: Nose normal.     Mouth/Throat:     Mouth: Mucous membranes are moist.  Eyes:     General: No scleral icterus.  Right eye: No discharge.        Left eye: No discharge.     Conjunctiva/sclera: Conjunctivae normal.     Pupils: Pupils are equal, round, and reactive to light.  Neck:     Trachea: Phonation normal.  Cardiovascular:     Rate and Rhythm: Normal rate and regular rhythm.     Heart sounds: No murmur heard.    No friction rub. No gallop.  Pulmonary:     Effort: Pulmonary effort is normal. No respiratory distress.     Breath sounds: No stridor. Examination of the right-lower field reveals rales. Rales present.  Abdominal:     General: There is no distension.     Palpations: Abdomen is soft.     Tenderness: There is abdominal tenderness in the epigastric area and periumbilical area. There is guarding. There is no rebound.  Musculoskeletal:        General: No tenderness. Normal range of motion.     Cervical back: Normal range of motion and neck supple.  Skin:    General: Skin is warm and dry.     Findings: No erythema or rash.  Neurological:     Mental Status: He is alert and oriented to person, place, and time.  Psychiatric:        Behavior: Behavior  normal.     ED Results and Treatments Labs (all labs ordered are listed, but only abnormal results are displayed) Labs Reviewed  LIPASE, BLOOD - Abnormal; Notable for the following components:      Result Value   Lipase 282 (*)    All other components within normal limits  COMPREHENSIVE METABOLIC PANEL WITH GFR - Abnormal; Notable for the following components:   CO2 20 (*)    Glucose, Bld 115 (*)    AST 48 (*)    Alkaline Phosphatase 138 (*)    Anion gap 23 (*)    All other components within normal limits  CBC - Abnormal; Notable for the following components:   WBC 12.2 (*)    RDW 17.2 (*)    Platelets 136 (*)    All other components within normal limits  RESP PANEL BY RT-PCR (RSV, FLU A&B, COVID)  RVPGX2  CULTURE, BLOOD (ROUTINE X 2)  CULTURE, BLOOD (ROUTINE X 2)  URINALYSIS, ROUTINE W REFLEX MICROSCOPIC  ETHANOL  I-STAT CG4 LACTIC ACID, ED                                                                                                                         EKG  EKG Interpretation Date/Time:  Sunday October 29 2024 20:36:46 EST Ventricular Rate:  126 PR Interval:  142 QRS Duration:  92 QT Interval:  319 QTC Calculation: 462 R Axis:   82  Text Interpretation: Sinus tachycardia Consider left ventricular hypertrophy Anterolateral infarct, age indeterminate Confirmed by Trine Likes [phone]) on 10/29/2024 11:46:58 PM       Radiology CT CHEST ABDOMEN PELVIS W CONTRAST Result Date:  10/30/2024 CLINICAL DATA:  Abdominal pain for 1 day, history of pancreatitis EXAM: CT CHEST, ABDOMEN, AND PELVIS WITH CONTRAST TECHNIQUE: Multidetector CT imaging of the chest, abdomen and pelvis was performed following the standard protocol during bolus administration of intravenous contrast. RADIATION DOSE REDUCTION: This exam was performed according to the departmental dose-optimization program which includes automated exposure control, adjustment of the mA and/or kV according to patient  size and/or use of iterative reconstruction technique. CONTRAST:  OMNIPAQUE  IOHEXOL  300 MG/ML  SOLN COMPARISON:  06/16/2024 FINDINGS: CT CHEST FINDINGS Cardiovascular: Mild atherosclerotic calcifications of the aorta are noted. No aneurysmal dilatation is seen. Heart is not significantly enlarged in size. Minimal coronary calcifications are noted. Pulmonary artery as visualized is within normal limits although not timed for embolus evaluation. Mediastinum/Nodes: Thoracic inlet is within normal limits. No hilar or mediastinal adenopathy is noted. The esophagus is unremarkable. Lungs/Pleura: The lungs are well aerated bilaterally. Focal consolidation is noted in right lower lobe centrally. Patchy infiltrate is also noted in the inferior aspect of the right middle lobe. These changes are consistent with multifocal infiltrate. Musculoskeletal: Degenerative changes of the thoracic spine are noted. CT ABDOMEN PELVIS FINDINGS Hepatobiliary: Fatty infiltration of the liver is noted. The gallbladder is unremarkable. Pancreas: Pancreas is well visualized without evidence of pancreatic necrosis the tail appears atrophic. Some peripancreatic inflammatory changes and fluid are noted surrounding the head and uncinate process consistent with focal pancreatitis. Spleen: Normal in size without focal abnormality. Adrenals/Urinary Tract: Adrenal glands are within normal limits. Kidneys demonstrate a normal enhancement pattern. Small 1 cm cyst is noted in the lower pole of the right kidney. This appears simple in nature and no further follow-up is recommended. No obstructive changes are seen. The bladder is over distended. Stomach/Bowel: Stomach is within normal limits. Appendix appears normal. No evidence of bowel wall thickening, distention, or inflammatory changes. Small bowel and stomach are unremarkable. Vascular/Lymphatic: No significant vascular findings are present. No enlarged abdominal or pelvic lymph nodes.  Reproductive: Prostate is unremarkable. Other: No abdominal wall hernia or abnormality. No abdominopelvic ascites. Musculoskeletal: No acute or significant osseous findings. IMPRESSION: CT of the chest: Patchy multifocal infiltrate in the right lung. CT of the abdomen and pelvis: Changes consistent with focal pancreatitis in the head and uncinate process. Electronically Signed   By: Oneil Devonshire M.D.   On: 10/30/2024 00:23    Medications Ordered in ED Medications  HYDROmorphone  (DILAUDID ) injection 0.5 mg (0.5 mg Intravenous Given 10/30/24 0025)  sodium chloride  0.9 % bolus 1,000 mL (1,000 mLs Intravenous New Bag/Given 10/30/24 0016)  prochlorperazine  (COMPAZINE ) injection 10 mg (10 mg Intravenous Given 10/30/24 0016)  iohexol  (OMNIPAQUE ) 300 MG/ML solution 100 mL (100 mLs Intravenous Contrast Given 10/29/24 2356)   Procedures Procedures  (including critical care time)   This chart was dictated using voice recognition software.  Despite best efforts to proofread,  errors can occur which can change the documentation meaning.   Trine Raynell Moder, MD 10/30/24 [phone]  "

## 2024-10-30 ENCOUNTER — Encounter (HOSPITAL_COMMUNITY): Payer: Self-pay | Admitting: Internal Medicine

## 2024-10-30 DIAGNOSIS — E8721 Acute metabolic acidosis: Secondary | ICD-10-CM

## 2024-10-30 DIAGNOSIS — F411 Generalized anxiety disorder: Secondary | ICD-10-CM

## 2024-10-30 DIAGNOSIS — F10139 Alcohol abuse with withdrawal, unspecified: Secondary | ICD-10-CM | POA: Diagnosis not present

## 2024-10-30 DIAGNOSIS — G40909 Epilepsy, unspecified, not intractable, without status epilepticus: Secondary | ICD-10-CM | POA: Diagnosis present

## 2024-10-30 DIAGNOSIS — F10939 Alcohol use, unspecified with withdrawal, unspecified: Secondary | ICD-10-CM | POA: Diagnosis not present

## 2024-10-30 DIAGNOSIS — Z87898 Personal history of other specified conditions: Secondary | ICD-10-CM

## 2024-10-30 DIAGNOSIS — Z91199 Patient's noncompliance with other medical treatment and regimen due to unspecified reason: Secondary | ICD-10-CM | POA: Diagnosis not present

## 2024-10-30 DIAGNOSIS — Y901 Blood alcohol level of 20-39 mg/100 ml: Secondary | ICD-10-CM | POA: Diagnosis present

## 2024-10-30 DIAGNOSIS — J690 Pneumonitis due to inhalation of food and vomit: Secondary | ICD-10-CM | POA: Diagnosis present

## 2024-10-30 DIAGNOSIS — Z8249 Family history of ischemic heart disease and other diseases of the circulatory system: Secondary | ICD-10-CM | POA: Diagnosis not present

## 2024-10-30 DIAGNOSIS — E8729 Other acidosis: Secondary | ICD-10-CM

## 2024-10-30 DIAGNOSIS — E872 Acidosis, unspecified: Secondary | ICD-10-CM | POA: Diagnosis present

## 2024-10-30 DIAGNOSIS — Z720 Tobacco use: Secondary | ICD-10-CM

## 2024-10-30 DIAGNOSIS — F209 Schizophrenia, unspecified: Secondary | ICD-10-CM | POA: Diagnosis present

## 2024-10-30 DIAGNOSIS — R569 Unspecified convulsions: Secondary | ICD-10-CM | POA: Diagnosis not present

## 2024-10-30 DIAGNOSIS — F10239 Alcohol dependence with withdrawal, unspecified: Secondary | ICD-10-CM | POA: Diagnosis present

## 2024-10-30 DIAGNOSIS — Z5900 Homelessness unspecified: Secondary | ICD-10-CM | POA: Diagnosis not present

## 2024-10-30 DIAGNOSIS — D696 Thrombocytopenia, unspecified: Secondary | ICD-10-CM | POA: Diagnosis present

## 2024-10-30 DIAGNOSIS — R7881 Bacteremia: Secondary | ICD-10-CM | POA: Diagnosis not present

## 2024-10-30 DIAGNOSIS — K861 Other chronic pancreatitis: Secondary | ICD-10-CM | POA: Diagnosis present

## 2024-10-30 DIAGNOSIS — F10930 Alcohol use, unspecified with withdrawal, uncomplicated: Secondary | ICD-10-CM

## 2024-10-30 DIAGNOSIS — R6510 Systemic inflammatory response syndrome (SIRS) of non-infectious origin without acute organ dysfunction: Secondary | ICD-10-CM | POA: Diagnosis not present

## 2024-10-30 DIAGNOSIS — F17210 Nicotine dependence, cigarettes, uncomplicated: Secondary | ICD-10-CM | POA: Diagnosis present

## 2024-10-30 DIAGNOSIS — F319 Bipolar disorder, unspecified: Secondary | ICD-10-CM | POA: Diagnosis not present

## 2024-10-30 DIAGNOSIS — K852 Alcohol induced acute pancreatitis without necrosis or infection: Secondary | ICD-10-CM | POA: Diagnosis present

## 2024-10-30 DIAGNOSIS — E876 Hypokalemia: Secondary | ICD-10-CM | POA: Diagnosis present

## 2024-10-30 DIAGNOSIS — F3181 Bipolar II disorder: Secondary | ICD-10-CM | POA: Diagnosis present

## 2024-10-30 DIAGNOSIS — Z800 Family history of malignant neoplasm of digestive organs: Secondary | ICD-10-CM | POA: Diagnosis not present

## 2024-10-30 DIAGNOSIS — B9689 Other specified bacterial agents as the cause of diseases classified elsewhere: Secondary | ICD-10-CM | POA: Diagnosis present

## 2024-10-30 DIAGNOSIS — F10229 Alcohol dependence with intoxication, unspecified: Secondary | ICD-10-CM | POA: Diagnosis present

## 2024-10-30 DIAGNOSIS — Z1152 Encounter for screening for COVID-19: Secondary | ICD-10-CM | POA: Diagnosis not present

## 2024-10-30 DIAGNOSIS — G8929 Other chronic pain: Secondary | ICD-10-CM | POA: Diagnosis present

## 2024-10-30 DIAGNOSIS — Z79899 Other long term (current) drug therapy: Secondary | ICD-10-CM | POA: Diagnosis not present

## 2024-10-30 DIAGNOSIS — B20 Human immunodeficiency virus [HIV] disease: Secondary | ICD-10-CM | POA: Diagnosis present

## 2024-10-30 DIAGNOSIS — F4310 Post-traumatic stress disorder, unspecified: Secondary | ICD-10-CM | POA: Diagnosis present

## 2024-10-30 DIAGNOSIS — Z6372 Alcoholism and drug addiction in family: Secondary | ICD-10-CM | POA: Diagnosis not present

## 2024-10-30 LAB — URINALYSIS, ROUTINE W REFLEX MICROSCOPIC
Bacteria, UA: NONE SEEN
Bilirubin Urine: NEGATIVE
Glucose, UA: NEGATIVE mg/dL
Ketones, ur: 80 mg/dL — AB
Leukocytes,Ua: NEGATIVE
Nitrite: NEGATIVE
Protein, ur: >=300 mg/dL — AB
Specific Gravity, Urine: 1.037 — ABNORMAL HIGH (ref 1.005–1.030)
pH: 6 (ref 5.0–8.0)

## 2024-10-30 LAB — CBC
HCT: 31.6 % — ABNORMAL LOW (ref 39.0–52.0)
Hemoglobin: 10.4 g/dL — ABNORMAL LOW (ref 13.0–17.0)
MCH: 30.5 pg (ref 26.0–34.0)
MCHC: 32.9 g/dL (ref 30.0–36.0)
MCV: 92.7 fL (ref 80.0–100.0)
Platelets: 84 K/uL — ABNORMAL LOW (ref 150–400)
RBC: 3.41 MIL/uL — ABNORMAL LOW (ref 4.22–5.81)
RDW: 17.2 % — ABNORMAL HIGH (ref 11.5–15.5)
WBC: 8.1 K/uL (ref 4.0–10.5)
nRBC: 0 % (ref 0.0–0.2)

## 2024-10-30 LAB — COMPREHENSIVE METABOLIC PANEL WITH GFR
ALT: 29 U/L (ref 0–44)
AST: 34 U/L (ref 15–41)
Albumin: 3.5 g/dL (ref 3.5–5.0)
Alkaline Phosphatase: 109 U/L (ref 38–126)
Anion gap: 20 — ABNORMAL HIGH (ref 5–15)
BUN: <5 mg/dL — ABNORMAL LOW (ref 6–20)
CO2: 19 mmol/L — ABNORMAL LOW (ref 22–32)
Calcium: 8.6 mg/dL — ABNORMAL LOW (ref 8.9–10.3)
Chloride: 98 mmol/L (ref 98–111)
Creatinine, Ser: 0.59 mg/dL — ABNORMAL LOW (ref 0.61–1.24)
GFR, Estimated: >60 mL/min
Glucose, Bld: 92 mg/dL (ref 70–99)
Potassium: 3.8 mmol/L (ref 3.5–5.1)
Sodium: 137 mmol/L (ref 135–145)
Total Bilirubin: 0.5 mg/dL (ref 0.0–1.2)
Total Protein: 6.5 g/dL (ref 6.5–8.1)

## 2024-10-30 LAB — T-HELPER CELLS (CD4) COUNT (NOT AT ARMC)
CD4 % Helper T Cell: 36 % (ref 33–65)
CD4 T Cell Abs: 161 /uL — ABNORMAL LOW (ref 400–1790)

## 2024-10-30 LAB — ETHANOL: Alcohol, Ethyl (B): 23 mg/dL — ABNORMAL HIGH

## 2024-10-30 LAB — I-STAT CG4 LACTIC ACID, ED: Lactic Acid, Venous: 1.9 mmol/L (ref 0.5–1.9)

## 2024-10-30 LAB — MRSA NEXT GEN BY PCR, NASAL: MRSA by PCR Next Gen: NOT DETECTED

## 2024-10-30 MED ORDER — THIAMINE HCL 100 MG/ML IJ SOLN
100.0000 mg | Freq: Every day | INTRAMUSCULAR | Status: DC
  Administered 2024-10-30 – 2024-11-05 (×7): 100 mg via INTRAVENOUS
  Filled 2024-10-30 (×7): qty 2

## 2024-10-30 MED ORDER — PHENOBARBITAL 32.4 MG PO TABS
32.4000 mg | ORAL_TABLET | Freq: Three times a day (TID) | ORAL | Status: AC
  Administered 2024-11-03 – 2024-11-05 (×6): 32.4 mg via ORAL
  Filled 2024-10-30 (×6): qty 1

## 2024-10-30 MED ORDER — SODIUM CHLORIDE 0.9% FLUSH
3.0000 mL | Freq: Two times a day (BID) | INTRAVENOUS | Status: DC
  Administered 2024-10-30 – 2024-11-08 (×14): 3 mL via INTRAVENOUS

## 2024-10-30 MED ORDER — ONDANSETRON HCL 4 MG/2ML IJ SOLN
4.0000 mg | Freq: Four times a day (QID) | INTRAMUSCULAR | Status: DC | PRN
  Administered 2024-10-30 – 2024-11-06 (×8): 4 mg via INTRAVENOUS
  Filled 2024-10-30 (×8): qty 2

## 2024-10-30 MED ORDER — FOLIC ACID 1 MG PO TABS
1.0000 mg | ORAL_TABLET | Freq: Every day | ORAL | Status: DC
  Administered 2024-10-30 – 2024-11-08 (×10): 1 mg via ORAL
  Filled 2024-10-30 (×8): qty 1

## 2024-10-30 MED ORDER — ACETAMINOPHEN 325 MG PO TABS
650.0000 mg | ORAL_TABLET | Freq: Four times a day (QID) | ORAL | Status: DC | PRN
  Administered 2024-10-30 – 2024-11-08 (×6): 650 mg via ORAL
  Filled 2024-10-30 (×5): qty 2

## 2024-10-30 MED ORDER — MORPHINE SULFATE (PF) 2 MG/ML IV SOLN
1.0000 mg | INTRAVENOUS | Status: DC | PRN
  Administered 2024-10-30 – 2024-11-01 (×7): 1 mg via INTRAVENOUS
  Filled 2024-10-30 (×7): qty 1

## 2024-10-30 MED ORDER — LORAZEPAM 2 MG/ML IJ SOLN
2.0000 mg | Freq: Four times a day (QID) | INTRAMUSCULAR | Status: DC | PRN
  Administered 2024-10-30 – 2024-11-01 (×2): 2 mg via INTRAVENOUS
  Filled 2024-10-30 (×4): qty 1

## 2024-10-30 MED ORDER — LACTATED RINGERS IV BOLUS
1000.0000 mL | Freq: Once | INTRAVENOUS | Status: AC
  Administered 2024-10-30: 1000 mL via INTRAVENOUS

## 2024-10-30 MED ORDER — SODIUM CHLORIDE 0.9 % IV SOLN: 250.0000 mL | INTRAVENOUS | Status: AC | PRN

## 2024-10-30 MED ORDER — ADULT MULTIVITAMIN W/MINERALS CH
1.0000 | ORAL_TABLET | Freq: Every day | ORAL | Status: DC
  Administered 2024-10-30 – 2024-11-08 (×10): 1 via ORAL
  Filled 2024-10-30 (×8): qty 1

## 2024-10-30 MED ORDER — ACETAMINOPHEN 650 MG RE SUPP: 650.0000 mg | Freq: Four times a day (QID) | RECTAL | Status: DC | PRN

## 2024-10-30 MED ORDER — SODIUM CHLORIDE 0.9 % IV SOLN
3.0000 g | Freq: Four times a day (QID) | INTRAVENOUS | Status: DC
  Administered 2024-10-30 – 2024-11-04 (×21): 3 g via INTRAVENOUS
  Filled 2024-10-30 (×24): qty 8

## 2024-10-30 MED ORDER — LACTATED RINGERS IV SOLN: INTRAVENOUS | Status: DC

## 2024-10-30 MED ORDER — SODIUM CHLORIDE 0.9% FLUSH
3.0000 mL | Freq: Two times a day (BID) | INTRAVENOUS | Status: DC
  Administered 2024-10-30 – 2024-11-08 (×16): 3 mL via INTRAVENOUS

## 2024-10-30 MED ORDER — HYDROMORPHONE HCL 1 MG/ML IJ SOLN
0.5000 mg | INTRAMUSCULAR | Status: DC | PRN
  Administered 2024-10-30 – 2024-11-05 (×33): 1 mg via INTRAVENOUS
  Filled 2024-10-30 (×35): qty 1

## 2024-10-30 MED ORDER — SODIUM CHLORIDE 0.9% FLUSH: 3.0000 mL | INTRAVENOUS | Status: DC | PRN

## 2024-10-30 MED ORDER — ORAL CARE MOUTH RINSE: 15.0000 mL | OROMUCOSAL | Status: DC | PRN

## 2024-10-30 MED ORDER — LORAZEPAM 1 MG PO TABS
1.0000 mg | ORAL_TABLET | ORAL | Status: DC | PRN
  Administered 2024-10-30: 3 mg via ORAL
  Filled 2024-10-30: qty 4

## 2024-10-30 MED ORDER — ONDANSETRON HCL 4 MG PO TABS: 4.0000 mg | ORAL_TABLET | Freq: Four times a day (QID) | ORAL | Status: DC | PRN

## 2024-10-30 MED ORDER — PIPERACILLIN-TAZOBACTAM 3.375 G IVPB 30 MIN
3.3750 g | Freq: Once | INTRAVENOUS | Status: AC
  Administered 2024-10-30: 3.375 g via INTRAVENOUS
  Filled 2024-10-30: qty 50

## 2024-10-30 MED ORDER — LEVETIRACETAM 500 MG PO TABS: 500.0000 mg | ORAL_TABLET | Freq: Two times a day (BID) | ORAL | Status: DC

## 2024-10-30 MED ORDER — PHENOBARBITAL 32.4 MG PO TABS
64.8000 mg | ORAL_TABLET | Freq: Three times a day (TID) | ORAL | Status: AC
  Administered 2024-11-01 – 2024-11-03 (×6): 64.8 mg via ORAL
  Filled 2024-10-30 (×6): qty 2

## 2024-10-30 MED ORDER — CHLORHEXIDINE GLUCONATE CLOTH 2 % EX PADS
6.0000 | MEDICATED_PAD | Freq: Every day | CUTANEOUS | Status: DC
  Administered 2024-10-30 – 2024-11-01 (×3): 6 via TOPICAL

## 2024-10-30 MED ORDER — ENOXAPARIN SODIUM 40 MG/0.4ML IJ SOSY
40.0000 mg | PREFILLED_SYRINGE | INTRAMUSCULAR | Status: DC
  Filled 2024-10-30 (×6): qty 0.4

## 2024-10-30 MED ORDER — NICOTINE 14 MG/24HR TD PT24
14.0000 mg | MEDICATED_PATCH | Freq: Every day | TRANSDERMAL | Status: DC | PRN
  Administered 2024-11-02 – 2024-11-03 (×2): 14 mg via TRANSDERMAL
  Filled 2024-10-30 (×2): qty 1

## 2024-10-30 MED ORDER — LORAZEPAM 2 MG/ML IJ SOLN
1.0000 mg | INTRAMUSCULAR | Status: DC | PRN
  Administered 2024-10-30 – 2024-11-07 (×12): 1 mg via INTRAVENOUS
  Filled 2024-10-30 (×9): qty 1

## 2024-10-30 MED ORDER — PHENOBARBITAL 32.4 MG PO TABS
97.2000 mg | ORAL_TABLET | Freq: Three times a day (TID) | ORAL | Status: AC
  Administered 2024-10-30 – 2024-11-01 (×6): 97.2 mg via ORAL
  Filled 2024-10-30 (×6): qty 3

## 2024-10-30 MED ORDER — LEVETIRACETAM (KEPPRA) 500 MG/5 ML ADULT IV PUSH
500.0000 mg | Freq: Two times a day (BID) | INTRAVENOUS | Status: DC
  Administered 2024-10-30 – 2024-11-05 (×13): 500 mg via INTRAVENOUS
  Filled 2024-10-30 (×14): qty 5

## 2024-10-30 MED ORDER — LORAZEPAM 2 MG/ML IJ SOLN
1.0000 mg | INTRAMUSCULAR | Status: DC | PRN
  Administered 2024-10-30 (×2): 4 mg via INTRAVENOUS
  Filled 2024-10-30 (×2): qty 2

## 2024-10-30 NOTE — Assessment & Plan Note (Addendum)
 Asymptomatic Not currently taking Biktarvy , Bactrim  Outpatient follow-up with infectious disease

## 2024-10-30 NOTE — Assessment & Plan Note (Signed)
 Likely associated with bone marrow suppression in the setting of alcohol  use Values have been fairly highly variable on prior checks Will follow for now

## 2024-10-30 NOTE — Assessment & Plan Note (Addendum)
 Acute on chronic pancreatitis in the setting of continued alcohol  abuse CT abdomen pelvis showing acute pancreatitis of uncinate process and pancreatic head History of chronic alcohol  use disorder and last drink of alcohol  on day of presentation Given IVF, Dilaudid  Starting clear liquid diet.  Advance diet as patient tolerates. Continue Zofran  as needed 43 ER visits and 7 admissions in the last 6 months Needs complete abstinence/cessation of alcohol  given his recurrent pancreatitis events, as this is the etiology to his multiple hospitalizations and ED visits Not currently taking Creon 

## 2024-10-30 NOTE — Progress Notes (Signed)
" °   10/30/24 0527  Assess: MEWS Score  Temp (!) 102.4 F (39.1 C)  BP 118/76  MAP (mmHg) 90  Pulse Rate (!) 127  Resp 18  SpO2 92 %  O2 Device Room Air  Assess: MEWS Score  MEWS Temp 2  MEWS Systolic 0  MEWS Pulse 2  MEWS RR 0  MEWS LOC 0  MEWS Score 4  MEWS Score Color Red  Assess: if the MEWS score is Yellow or Red  Were vital signs accurate and taken at a resting state? Yes  Does the patient meet 2 or more of the SIRS criteria? Yes  Does the patient have a confirmed or suspected source of infection? Yes  MEWS guidelines implemented  Yes, red  Treat  MEWS Interventions Considered administering scheduled or prn medications/treatments as ordered  Take Vital Signs  Increase Vital Sign Frequency  Red: Q1hr x2, continue Q4hrs until patient remains green for 12hrs  Escalate  MEWS: Escalate Red: Discuss with charge nurse and notify provider. Consider notifying RRT. If remains red for 2 hours consider need for higher level of care  Notify: Charge Nurse/RN  Name of Charge Nurse/RN Notified Mickel Naegeli, RN  Provider Notification  Provider Name/Title A. Andrez, NP  Date Provider Notified 10/30/24  Time Provider Notified [phone]  Method of Notification  (secure chat)  Notification Reason Other (Comment) (RED MEWS, CIWA 21)  Provider response No new orders  Notify: Rapid Response  Name of Rapid Response RN Notified Ward Hurst, RN  Date Rapid Response Notified 10/30/24  Time Rapid Response Notified 0545  Assess: SIRS CRITERIA  SIRS Temperature  1  SIRS Respirations  0  SIRS Pulse 1  SIRS WBC 0  SIRS Score Sum  2    "

## 2024-10-30 NOTE — Assessment & Plan Note (Addendum)
 Patient meets SIRS criteria with fever to 102.7, tachycardia, tachypnea Chest x-ray showing evidence for aspiration pneumonia In the ED patient received IV Zosyn  -> Unasyn  Lactic acid within normal range Blood cultures NTD Some of the SIRS criteria may be explained by withdrawal

## 2024-10-30 NOTE — Assessment & Plan Note (Addendum)
 Previously on Seroquel , not currently taking medication Psychiatry consulted, recommends increasing gabapentin  dose He does not currently meet IVC crtieria

## 2024-10-30 NOTE — Progress Notes (Addendum)
 " Progress Note   Patient: Gary [name] FMW:[id number] DOB: 12-22-1985 DOA: 10/29/2024     0 DOS: the patient was seen and examined on 10/30/2024   Brief hospital course: 38yo with h/o alcohol  abuse, chronic pancreatitis with frequent hospitalizations for acute on chronic pancreatitis, HIV/AIDS, medication noncompliance, seizure disorder, bipolar schizophrenia, PTSD and homelessness who has had frequent recent ER visits who presented on 12/21 with abdominal pain.  Continues with active drinking.  CT with focal pancreatitis of the head and uncinate process as well as patchy infiltrate in the R lung.  He was given Zosyn , IV, and Dilaudid .      Assessment & Plan Acute alcoholic pancreatitis Acute on chronic pancreatitis in the setting of continued alcohol  abuse CT abdomen pelvis showing acute pancreatitis of uncinate process and pancreatic head History of chronic alcohol  use disorder and last drink of alcohol  on day of presentation Given IVF, Dilaudid  Starting clear liquid diet.  Advance diet as patient tolerates. Continue Zofran  as needed 43 ER visits and 7 admissions in the last 6 months Needs complete abstinence/cessation of alcohol  given his recurrent pancreatitis events, as this is the etiology to his multiple hospitalizations and ED visits Not currently taking Creon  Aspiration pneumonia (HCC) SIRS (systemic inflammatory response syndrome) (HCC) Patient meets SIRS criteria with fever to 102.7, tachycardia, tachypnea Chest x-ray showing evidence for aspiration pneumonia In the ED patient received IV Zosyn  -> Unasyn  Lactic acid within normal range Blood cultures NTD Some of the SIRS criteria may be explained by withdrawal Alcohol  withdrawal (HCC) Patient with continued alcohol  abuse, last drink day of admission, shortly after last dc Monitor on CIWA protocol  Counsel on complete alcohol  cessation/abstinence He has had severe withdrawal on prior hospitalizations Transfer to  SDU Start phenobarbital  taper per pharmacy Psychiatry consulted PCCM consulted; if he transitions to IV Phenobarb, they will assume care High anion gap metabolic acidosis Mild AGMA in the setting of nausea/vomiting Treated with IVF, continuous infusion for now at 125 cc/hr HIV (human immunodeficiency virus infection) (HCC) Asymptomatic Not currently taking Biktarvy , Bactrim  Outpatient follow-up with infectious disease Bipolar disorder (HCC) PTSD (post-traumatic stress disorder) GAD (generalized anxiety disorder) Previously on Seroquel , not currently taking medication Psychiatry consulted, recommends increasing gabapentin  dose He does not currently meet IVC crtieria Thrombocytopenia Likely associated with bone marrow suppression in the setting of alcohol  use Values have been fairly highly variable on prior checks Will follow for now History of seizure Associated with ETOH withdrawal, last admitted for this issue from 10/29-11/3 Has not been taking Keppra  at home Will start IV Keppra  500 mg twice daily Continue Ativan  2 mg every 6 as needed for seizure Once nausea and vomiting resolves can transition to oral Keppra   Seizure precautions ordered Tobacco use disorder Cessation encouraged cessation Nicotine  patch ordered      Consultants: Psychiatry PCCM TOC team  Procedures: None  Antibiotics: Unasyn  12/22- Zosyn  x 1  30 Day Unplanned Readmission Risk Score    Flowsheet Row ED to Hosp-Admission (Current) from 10/29/2024 in [facility]LO[facility]  30 Day Unplanned Readmission Risk Score (%) 99.59 Filed at 10/30/2024 0801    This score is the patient's risk of an unplanned readmission within 30 days of being discharged (0 -100%). The score is based on dignosis, age, lab data, medications, orders, and past utilization.   Low:  0-14.9   Medium: 15-21.9   High: 22-29.9   Extreme: 30 and above  Subjective: Oriented but clearly in  active withdrawal with tachycardia, agitation despite Ativan  and pain medication.   Objective: Vitals:   10/30/24 1346 10/30/24 1349  BP: (!) 143/80   Pulse: (!) 123 (!) 122  Resp: (!) 23 19  Temp: 100.2 F (37.9 C)   SpO2: (!) 88% 91%    Intake/Output Summary (Last 24 hours) at 10/30/2024 1432 Last data filed at 10/30/2024 0930 Gross per 24 hour  Intake --  Output 400 ml  Net -400 ml   Filed Weights   10/30/24 1346  Weight: 68.2 kg    Exam:  General:  Appears somewhat agitated Eyes:  normal lids, iris ENT:  grossly normal hearing, lips & tongue, mmm Cardiovascular:  RR with tachycardia. No LE edema.  Respiratory:   CTA bilaterally with no wheezes/rales/rhonchi.  Normal respiratory effort. Abdomen:  soft, mildly diffusely tender, ND Skin:  no rash or induration seen on limited exam Musculoskeletal:  grossly normal tone BUE/BLE, good ROM, no bony abnormality Psychiatric:  mildly agitated mood and affect, speech sparse but appropriate, AOx3 Neurologic:  CN 2-12 grossly intact, moves all extremities in coordinated fashion  Data Reviewed: I have reviewed the patient's lab results since admission.  Pertinent labs for today include:   CO2 19 Anion gap 20, improved Lactate 1.9 ETOH 23 Blood cultures pending    Family Communication: None present     Code Status: Full Code  Disposition: Status is: Inpatient Remains inpatient appropriate because: ongoing management  Total critical care time: 35 minutes Critical care time was exclusive of separately billable procedures and treating other patients. Critical care was necessary to treat or prevent imminent or life-threatening deterioration. Critical care was time spent personally by me on the following activities: development of treatment plan with patient and/or surrogate as well as nursing, discussions with consultants, evaluation of patient's response to treatment, examination of patient, obtaining history from  patient or surrogate, ordering and performing treatments and interventions, ordering and review of laboratory studies, ordering and review of radiographic studies, pulse oximetry and re-evaluation of patient's condition.   Unresulted Labs (From admission, onward)     Start     Ordered   10/30/24 1407  MRSA Next Gen by PCR, Nasal  Once,   R        10/30/24 1407   10/30/24 0414  HIV-1/HIV-2 Qual RNA  Once,   R        10/30/24 0413   10/30/24 0402  Expectorated Sputum Assessment w Gram Stain, Rflx to Resp Cult  Once,   R       Question Answer Comment  Patient immune status Immunocompromised   Release to patient Immediate      10/30/24 0401             Author: Delon Herald, MD 10/30/2024 2:32 PM  For on call review [email or web address].            "

## 2024-10-30 NOTE — Assessment & Plan Note (Addendum)
 Mild AGMA in the setting of nausea/vomiting Treated with IVF, continuous infusion for now at 125 cc/hr

## 2024-10-30 NOTE — Plan of Care (Signed)
 ?  Problem: Elimination: ?Goal: Will not experience complications related to urinary retention ?Outcome: Progressing ?  ?

## 2024-10-30 NOTE — Consult Note (Addendum)
 "  NAME:  Gary [name], MRN:  [id number], DOB:  19-Dec-1985, LOS: 0 ADMISSION DATE:  10/29/2024, CONSULTATION DATE:  10/30/2024 REFERRING MD:  Dr. Barbarann, CHIEF COMPLAINT:  pancreatitis, alcohol  withdrawal   History of Present Illness:  Gary [name] is a 38 year old male with history of alcohol  abuse, recurrent alcoholic pancreatitis, anxiety, bipolar disorder, osteopenia, seizure, HIV, pancreatic insufficiency, and GERD who was admitted 12/21 by [facility] for acute pancreatitis. He was being managed on the floor and had worsening symptoms of alcohol  withdrawal, therefore PCCM was consulted for consideration of ICU admission.   Pertinent  Medical History  Per above  Significant Hospital Events: Including procedures, antibiotic start and stop dates in addition to other pertinent events   10/30/2024 Admitted for acute pancreatitis   Interim History / Subjective:  Patient tells me his last drink was in the early morning hours yesterday. He drinks 2 fifths of vodka daily. He smokes cigarettes but tells me amount varies day by day. Continues to endorse mid-abdominal pain. Nausea/vomiting is improving.  Objective    Blood pressure 122/75, pulse (!) 119, temperature 100.3 F (37.9 C), temperature source Oral, resp. rate 20, SpO2 97%.        Intake/Output Summary (Last 24 hours) at 10/30/2024 1217 Last data filed at 10/30/2024 0930 Gross per 24 hour  Intake --  Output 400 ml  Net -400 ml   There were no vitals filed for this visit.  Examination: General: acute on chronically ill appearing male, somnolent, resting in bed, tremulous HENT: Gary [name], sclera anicteric, dry mucous membranes  Lungs: breathing comfortably on room air Cardiovascular: tachycardic  Abdomen: soft, tender epigastric area, no rebound or guarding  Extremities: warm, dry, no edema  Neuro: alert and oriented x 4, somnolent, moves all extremities spontaneously  GU: deferred  Resolved problem list   Assessment and Plan    Alcohol  withdrawal; last drink prior to arrival 12/21; ethyl alcohol  level 23 on admission Acute alcoholic pancreatis  - Currently protecting airway with adequate gas exchange on room air but at high risk of decompensation as withdrawal is likely to progress over next 24-48 hours and already somnolent now on ativan  and oral phenobarbital . Recommend transfer to SDU for closer monitoring - Continue PO phenobarbital  protocol, if unable to take PO and needs transition to IV will need ICU monitoring - Continue IV Ativan  PRN for agitation and seizure only to avoid respiratory depression  - PCCM will be available as needed to escalate patient to ICU status should he worsen or require IV dex or IV phenobarb   Concern for aspiration pneumonia High anion gap metabolic acidosis  History of HIV  Chronic thrombocytopenia History of seizure History of bipolar disorder, GAD and PTSD  - Per primary   Labs   CBC: Recent Labs  Lab 10/29/24 2043 10/30/24 0801  WBC 12.2* 8.1  HGB 13.8 10.4*  HCT 42.0 31.6*  MCV 93.8 92.7  PLT 136* 84*    Basic Metabolic Panel: Recent Labs  Lab 10/29/24 2043 10/30/24 0614  NA 141 137  K 4.0 3.8  CL 98 98  CO2 20* 19*  GLUCOSE 115* 92  BUN 8 <5*  CREATININE 0.61 0.59*  CALCIUM  9.3 8.6*   GFR: CrCl cannot be calculated (Unknown ideal weight.). Recent Labs  Lab 10/29/24 2043 10/30/24 0212 10/30/24 0801  WBC 12.2*  --  8.1  LATICACIDVEN  --  1.9  --     Liver Function Tests: Recent Labs  Lab 10/29/24 2043 10/30/24  9385  AST 48* 34  ALT 44 29  ALKPHOS 138* 109  BILITOT 0.4 0.5  PROT 8.1 6.5  ALBUMIN 4.4 3.5   Recent Labs  Lab 10/29/24 2043  LIPASE 282*   No results for input(s): AMMONIA in the last 168 hours.  ABG    Component Value Date/Time   HCO3 27.7 11/01/2023 0343   TCO2 25 07/21/2024 1604   ACIDBASEDEF 2.3 (H) 05/26/2023 0459   O2SAT 78.6 11/01/2023 0343     Coagulation Profile: No results for input(s): INR,  PROTIME in the last 168 hours.  Cardiac Enzymes: No results for input(s): CKTOTAL, CKMB, CKMBINDEX, TROPONINI in the last 168 hours.  HbA1C: Hgb A1c MFr Bld  Date/Time Value Ref Range Status  04/18/2024 06:47 PM 4.8 4.8 - 5.6 % Final    Comment:    (NOTE)         Prediabetes: 5.7 - 6.4         Diabetes: >6.4         Glycemic control for adults with diabetes: <7.0   05/25/2023 12:43 AM 5.0 4.8 - 5.6 % Final    Comment:    (NOTE) Pre diabetes:          5.7%-6.4%  Diabetes:              >6.4%  Glycemic control for   <7.0% adults with diabetes     CBG: No results for input(s): GLUCAP in the last 168 hours.  Review of Systems:   Per above  Past Medical History:  He,  has a past medical history of Alcohol  abuse, Alcohol -induced pancreatitis (04/16/2022), Anxiety, Bipolar 2 disorder (HCC), HIV (human immunodeficiency virus infection) (HCC), Pancreatitis, PTSD (post-traumatic stress disorder), Schizophrenia (HCC), Seizures (HCC), and Subdural hematoma (HCC).   Surgical History:   Past Surgical History:  Procedure Laterality Date   BIOPSY  04/19/2022   Procedure: BIOPSY;  Surgeon: Gary [name]., MD;  Location: [facility] ENDOSCOPY;  Service: Gastroenterology;;   ENTEROSCOPY N/A 04/19/2022   Procedure: ENTEROSCOPY;  Surgeon: Gary [name]., MD;  Location: [facility] ENDOSCOPY;  Service: Gastroenterology;  Laterality: N/A;   INCISION AND DRAINAGE PERIRECTAL ABSCESS N/A 09/24/2016   Procedure: IRRIGATION AND DEBRIDEMENT PERIRECTAL ABSCESS;  Surgeon: Gary Pastel, MD;  Location: [facility] ORS;  Service: General;  Laterality: N/A;   none       Social History:   reports that he has been smoking cigarettes. He started smoking about 21 years ago. He has a 11 pack-year smoking history. He has been exposed to tobacco smoke. He has never used smokeless tobacco. He reports current alcohol  use of about 105.0 standard drinks of alcohol  per week. He reports that he does not  currently use drugs after having used the following drugs: Methamphetamines and Cocaine.   Family History:  His family history includes Alcohol  abuse in his father and mother; Alcoholism in his mother; Cancer in an other family member; Colon cancer in an other family member; Hypertension in his mother; Other in an other family member.   Allergies Allergies[1]   Home Medications  Prior to Admission medications  Medication Sig Start Date End Date Taking? Authorizing Provider  bictegravir-emtricitabine -tenofovir  AF (BIKTARVY ) 50-200-25 MG TABS tablet Take 1 tablet by mouth daily. Patient not taking: Reported on 10/30/2024 09/21/24   Garrick Charleston, MD  levETIRAcetam  (KEPPRA ) 500 MG tablet Take 1 tablet (500 mg total) by mouth 2 (two) times daily. Patient not taking: Reported on 10/30/2024 09/21/24   Garrick Charleston, MD  lipase/protease/amylase (  CREON ) 36000 UNITS CPEP capsule Take 1 capsule (36,000 Units total) by mouth 3 (three) times daily before meals. Patient not taking: Reported on 10/30/2024 05/26/24   Amin, Sumayya, MD  Multiple Vitamin (MULTIVITAMIN WITH MINERALS) TABS tablet Take 1 tablet by mouth daily. Patient not taking: Reported on 10/30/2024 10/12/24   Cole Kandi BROCKS, MD  sulfamethoxazole -trimethoprim  (BACTRIM  DS) 800-160 MG tablet Take 1 tablet by mouth every Monday, Wednesday, and Friday. Patient not taking: Reported on 10/30/2024 09/11/24 12/10/24  Dezii, Alexandra, DO  famotidine  (PEPCID ) 20 MG tablet Take 1 tablet (20 mg total) by mouth 2 (two) times daily. Patient not taking: Reported on 06/01/2019 05/14/19 06/02/19  [name], MD     Critical care time: n/a    Rexene LOISE Tanda DEVONNA [facility] 10/30/2024 1:10 PM  Please see [email or web address] for pager details.  From 7A-7P if no response, please call [phone] After hours, please call Ema [phone]         [1]  Allergies Allergen Reactions   Risperidone  And Paliperidone Other  (See Comments)    Patient states causes opposite effect (he will not take again), and gave him psychotic thoughts   Tegretol  [Carbamazepine ] Other (See Comments)    Vertigo  Paralysis   Caffeine Palpitations   "

## 2024-10-30 NOTE — Hospital Course (Addendum)
 38 year old man PMH alcohol  use disorder, recurrent alcoholic pancreatitis, HIV, PTSD, bipolar, medication noncompliance, heavy alcohol  use, presenting with abdominal pain, restlessness and visible hand tremor bilaterally.  Admitted for acute alcoholic pancreatitis, aspiration pneumonia.  Required treatment for acute alcohol  withdrawal.  Was seen by psychiatry and PCCM.  Condition gradually improved.  Treated for aspiration pneumonia with clinical resolution.  Seen by infectious disease.  Plan for outpatient follow-up  Consultants Psychiatry PCCM ID  Procedures/Events None

## 2024-10-30 NOTE — Assessment & Plan Note (Addendum)
 Associated with ETOH withdrawal, last admitted for this issue from 10/29-11/3 Has not been taking Keppra  at home Will start IV Keppra  500 mg twice daily Continue Ativan  2 mg every 6 as needed for seizure Once nausea and vomiting resolves can transition to oral Keppra   Seizure precautions ordered

## 2024-10-30 NOTE — Progress Notes (Signed)
 ED Pharmacy Antibiotic Sign Off An antibiotic consult was received from an ED provider for Zosyn  per pharmacy dosing for aspiration pneumonia. A chart review was completed to assess appropriateness.   The following one time order(s) were placed:  Zosyn  3.375g IV  Further antibiotic and/or antibiotic pharmacy consults should be ordered by the admitting provider if indicated.   Thank you for allowing pharmacy to be a part of this patient's care.   Arvin Gauss, PharmD Clinical Pharmacist 10/30/2024 1:47 AM

## 2024-10-30 NOTE — Assessment & Plan Note (Addendum)
 Patient with continued alcohol  abuse, last drink day of admission, shortly after last dc Monitor on CIWA protocol  Counsel on complete alcohol  cessation/abstinence He has had severe withdrawal on prior hospitalizations Transfer to SDU Start phenobarbital  taper per pharmacy Psychiatry consulted PCCM consulted; if he transitions to IV Phenobarb, they will assume care

## 2024-10-30 NOTE — Consult Note (Signed)
 [facility] Psychiatric Consult Initial  Patient Name: .Gary [name]  MRN: [id number]  DOB: [birth year]  Consult Order details:  Orders (From admission, onward)    Mode of Visit: In person    Psychiatry Consult Evaluation  Service Date: October 30, 2024 LOS:  LOS: 0 days  Chief ComplaintPTSD, GAD, and bipolar. Non compliant.   Primary Psychiatric Diagnoses  Alcohol  use disorder with withdrawal   Assessment  Gary [name] is a 38 y.o. male admitted: Medically Per hospitalist admission note: Gary [name] is a 38 y.o. male with medical history significant of HIV, alcohol  abuse, seizure disorder, chronic pancreatitis, depression and bipolar disorder presenting with alcohol  abuse, withdrawal seizure, thrombocytopenia.  Patient noted to have had multiple ER visits for alcohol  intoxication.  Most recent admission September 2025 for issues including alcohol  withdrawal.  Patient reports drinking roughly 1/2 gallon of liquor daily.  Last drink was yesterday, BAL 23.  Patient reports generalized malaise, nausea and vomiting.  Positive mild tremor.  No active SI/HI.  Some belly pain, n/v or diarrhea. Has been non complaint with his Biktarvy .  Patient states that he is ready to quit drinking.  Noted baseline seizures.     The patient presents with residual alcohol  withdrawal symptoms but is oriented 4. He reports poor oral intake due to pain, denies suicidal or homicidal ideation or plan, and notes hearing a voice outside his head without being able to describe it. He has a history of alcohol  withdrawal seizures and delirium tremens (DTs). He expresses interest in detoxification and rehabilitation but requests only medical detox, stating, give me some alcohol , while acknowledging that medical admission will be required for detox and that he will need a lot of Ativan . His judgment is impaired, as he attributes a pancreatitis flare to causes other than alcohol  and reports he will continue  drinking if able.   On examination, he shows no acute psychiatric symptoms, though he recently received medications for psychosis, agitation, and confusion. He endorses depressive symptoms including anhedonia, sadness, hopelessness, worthlessness, poor appetite, and anxiety, as well as excessive worrying and nervousness, but denies irritability, hallucinations, or weight changes, and remains linear and coherent. He is amenable to outpatient substance use disorder treatment. The patient reports daily alcohol  consumption of approximately  gallon of vodka for over a year, with increased intake over the past two months due to life stressors; his most recent use was yesterday. He denies recreational drug use and currently denies acute medical complaints. He has a history of DTs, alcohol  hallucinosis, and seizures.   Overall, his depressive symptoms and alcohol  use disorder are significantly impairing his daily functioning. While he is not currently a danger to himself, he meets criteria for inpatient dual-diagnosis or inpatient rehabilitation for psychiatric and substance use treatment, but does not meet criteria for involuntary commitment (IVC).  Diagnoses:  Active Hospital problems: Principal Problem:   Acute alcoholic pancreatitis Active Problems:   HIV (human immunodeficiency virus infection) (HCC)   Thrombocytopenia   High anion gap metabolic acidosis   Aspiration pneumonia (HCC)   Tobacco use disorder   GAD (generalized anxiety disorder)   PTSD (post-traumatic stress disorder)   Alcohol  withdrawal (HCC)   SIRS (systemic inflammatory response syndrome) (HCC)   Bipolar disorder (HCC)   History of seizure    Plan   ## Psychiatric Medication Recommendations:  Increase gabapentin  to 200mg  TID -Continue CIWA -COnsider CCM consult start phenobarb and may require precedex  ( will defer to primary team). Please note patient recently  admitted for detox in 08/2024 and also wanted to stop drinking  at that time.   ## Medical Decision Making Capacity: Not specifically addressed in this encounter  ## Further Work-up:   -- most recent EKG on 09/07/24 had QtC of 482 -- Pertinent labwork reviewed earlier this admission includes: CBC, CMP  ## Disposition:-- There are no psychiatric contraindications to discharge at this time  ## Behavioral / Environmental: -Utilize compassion and acknowledge the patient's experiences while setting clear and realistic expectations for care.    ## Safety and Observation Level:  - Based on my clinical evaluation, I estimate the patient to be at LOW risk of self harm in the current setting. - At this time, we recommend  routine. This decision is based on my review of the chart including patient's history and current presentation, interview of the patient, mental status examination, and consideration of suicide risk including evaluating suicidal ideation, plan, intent, suicidal or self-harm behaviors, risk factors, and protective factors. This judgment is based on our ability to directly address suicide risk, implement suicide prevention strategies, and develop a safety plan while the patient is in the clinical setting. Please contact our team if there is a concern that risk level has changed.  CSSR Risk Category:C-SSRS RISK CATEGORY: No Risk  Suicide Risk Assessment: Patient has following modifiable risk factors for suicide: substance use which we are addressing by education and offering outpatient substance use resources Patient has following non-modifiable or demographic risk factors for suicide: male gender Patient has the following protective factors against suicide: Supportive family  Thank you for this consult request. Recommendations have been communicated to the primary team.  We will sign off at this time.   Majel GORMAN Ramp, FNP       History of Present Illness  Relevant Aspects of [facility]   Patient Report:  On interview patient  is noted to be resting in bed.  He is slightly tremulous.  He is oriented to self, location, situation, only events leading up to ED visit, date and current president.  He reports improvement in his appetite.  He reports that he has been drinking 1/2 gallon  every day for almost 1 year and last 2 days he has been having gastric upset unable to drink anymore, stomach ache, not eating for 2 days that prompted the ED visit.  He reports he voluntarily brought himself to the ED visit.  He reports going through withdrawal because he could not drink.  He reports he could be drinking if he can.  He is requesting only for medical detox and discharge.    He denies any current symptoms of depression, denies hopelessness or worthlessness.  Reports poor energy and feeling tired in the context of ossification and not eating for 2 days.  He denies SI/HI/plan and denies visual hallucinations.  He denies nightmares or flashbacks.  He denies any history of abuse or any traumatic experience.  He denies current or recent episodes of mania/hypomania.  Psych ROS:  Depression: yes Anxiety:  intermittent Mania (lifetime and current): denies Psychosis: (lifetime and current): denies   Psychiatric and Social History  Psychiatric History:  Information collected from patient/chart  Prev Dx/Sx: Alcohol  use disorder Current Psych Provider: None reported Home Meds (current): Reported Previous Med Trials: Multiple antipsychotics gave him side effects Therapy: Denies  Prior Psych Hospitalization: 08/2024 for alcohol  use disodrder Prior Self Harm: Denies Prior Violence: Denies  Family Psych History: History of alcohol  use in family members Family Hx suicide:  Denies  Social History:   Educational Hx: High school Occupational Hx: Unemployed Legal Hx: Denies Living Situation: Lives with wife and children Spiritual Hx: Unknown Access to weapons/lethal means: Denies  Substance History Alcohol : On daily basis Type of  alcohol  hard liquor Last Drink on the day of ED visit Number of drinks per day 2 bottles of 1/5 History of alcohol  withdrawal seizures yes History of DT's yes Tobacco: Few cigarettes in a day Illicit drugs: Denies Prescription drug abuse: Denies Rehab hx: Denies  Exam Findings  Physical Exam: reviewed and agree with medical exam conducted by the medical provider Vital Signs:  Temp:  [97.6 F (36.4 C)-102.7 F (39.3 C)] 100.3 F (37.9 C) (12/22 1202) Pulse Rate:  [94-127] 119 (12/22 1202) Resp:  [16-26] 20 (12/22 1202) BP: (102-137)/(60-100) 122/75 (12/22 1202) SpO2:  [90 %-100 %] 97 % (12/22 1202) Blood pressure 122/75, pulse (!) 119, temperature 100.3 F (37.9 C), temperature source Oral, resp. rate 20, SpO2 97%. There is no height or weight on file to calculate BMI.    Mental Status Exam: General Appearance: Casual  Orientation:  Full (Time, Place, and Person)  Memory:  Immediate;   Fair Recent;   Fair Remote;   Fair  Concentration:  Concentration: Fair and Attention Span: Fair  Recall:  Fair  Attention  Fair  Eye Contact:  Fair  Speech:  Clear and Coherent  Language:  Fair  Volume:  Normal  Mood: fine  Affect:  Appropriate  Thought Process:  Coherent  Thought Content:  Logical  Suicidal Thoughts:  No  Homicidal Thoughts:  No  Judgement:  Intact  Insight:  Shallow  Psychomotor Activity:  Normal  Akathisia:  No  Fund of Knowledge:  Fair      Assets:  Communication Skills Desire for Improvement  Cognition:  WNL  ADL's:  Intact  AIMS (if indicated):        Other History   These have been pulled in through the EMR, reviewed, and updated if appropriate.  Family History:  The patient's family history includes Alcohol  abuse in his father and mother; Alcoholism in his mother; Cancer in an other family member; Colon cancer in an other family member; Hypertension in his mother; Other in an other family member.  Medical History: Past Medical History:   Diagnosis Date   Alcohol  abuse    Alcohol -induced pancreatitis 04/16/2022   Anxiety    Bipolar 2 disorder (HCC)    HIV (human immunodeficiency virus infection) (HCC)    Pancreatitis    PTSD (post-traumatic stress disorder)    Schizophrenia (HCC)    Seizures (HCC)    Subdural hematoma (HCC)     Surgical History: Past Surgical History:  Procedure Laterality Date   BIOPSY  04/19/2022   Procedure: BIOPSY;  Surgeon: Wilhelmenia Aloha Raddle., MD;  Location: [facility] ENDOSCOPY;  Service: Gastroenterology;;   ENTEROSCOPY N/A 04/19/2022   Procedure: ENTEROSCOPY;  Surgeon: Wilhelmenia Aloha Raddle., MD;  Location: [facility] ENDOSCOPY;  Service: Gastroenterology;  Laterality: N/A;   INCISION AND DRAINAGE PERIRECTAL ABSCESS N/A 09/24/2016   Procedure: IRRIGATION AND DEBRIDEMENT PERIRECTAL ABSCESS;  Surgeon: Carlin Pastel, MD;  Location: [facility] ORS;  Service: General;  Laterality: N/A;   none       Medications:   Current Facility-Administered Medications:    0.9 %  sodium chloride  infusion, 250 mL, Intravenous, PRN, Sundil, Subrina, MD   acetaminophen  (TYLENOL ) tablet 650 mg, 650 mg, Oral, Q6H PRN, 650 mg at 10/30/24 0552 **OR** acetaminophen  (TYLENOL ) suppository 650 mg,  650 mg, Rectal, Q6H PRN, Sundil, Subrina, MD   Ampicillin -Sulbactam (UNASYN ) 3 g in sodium chloride  0.9 % 100 mL IVPB, 3 g, Intravenous, Q6H, Poindexter, Leann T, RPH, Last Rate: 200 mL/hr at 10/30/24 0930, 3 g at 10/30/24 0930   enoxaparin  (LOVENOX ) injection 40 mg, 40 mg, Subcutaneous, Q24H, Sundil, Subrina, MD   folic acid  (FOLVITE ) tablet 1 mg, 1 mg, Oral, Daily, Sundil, Subrina, MD, 1 mg at 10/30/24 0908   HYDROmorphone  (DILAUDID ) injection 0.5-1 mg, 0.5-1 mg, Intravenous, Q4H PRN, Sundil, Subrina, MD, 1 mg at 10/30/24 9075   lactated ringers  infusion, , Intravenous, Continuous, Sundil, Subrina, MD, Last Rate: 125 mL/hr at 10/30/24 9372, New Bag at 10/30/24 9372   levETIRAcetam  (KEPPRA ) undiluted injection 500 mg, 500 mg, Intravenous,  Q12H, Sundil, Subrina, MD, 500 mg at 10/30/24 0456   LORazepam  (ATIVAN ) tablet 1-4 mg, 1-4 mg, Oral, Q1H PRN, 3 mg at 10/30/24 9076 **OR** LORazepam  (ATIVAN ) injection 1-4 mg, 1-4 mg, Intravenous, Q1H PRN, Sundil, Subrina, MD, 4 mg at 10/30/24 9345   LORazepam  (ATIVAN ) injection 2 mg, 2 mg, Intravenous, Q6H PRN, Sundil, Subrina, MD, 2 mg at 10/30/24 0430   morphine  (PF) 2 MG/ML injection 1 mg, 1 mg, Intravenous, Q2H PRN, Sundil, Subrina, MD   multivitamin with minerals tablet 1 tablet, 1 tablet, Oral, Daily, Sundil, Subrina, MD, 1 tablet at 10/30/24 0909   ondansetron  (ZOFRAN ) tablet 4 mg, 4 mg, Oral, Q6H PRN **OR** ondansetron  (ZOFRAN ) injection 4 mg, 4 mg, Intravenous, Q6H PRN, Sundil, Subrina, MD, 4 mg at 10/30/24 9076   PHENobarbital  (LUMINAL) tablet 97.2 mg, 97.2 mg, Oral, Q8H **FOLLOWED BY** [START ON 11/01/2024] PHENobarbital  (LUMINAL) tablet 64.8 mg, 64.8 mg, Oral, Q8H **FOLLOWED BY** [START ON 11/03/2024] PHENobarbital  (LUMINAL) tablet 32.4 mg, 32.4 mg, Oral, Q8H, Barbarann Nest, MD   sodium chloride  flush (NS) 0.9 % injection 3 mL, 3 mL, Intravenous, Q12H, Sundil, Subrina, MD, 3 mL at 10/30/24 0909   sodium chloride  flush (NS) 0.9 % injection 3 mL, 3 mL, Intravenous, Q12H, Sundil, Subrina, MD, 3 mL at 10/30/24 0909   sodium chloride  flush (NS) 0.9 % injection 3 mL, 3 mL, Intravenous, PRN, Sundil, Subrina, MD   thiamine  (VITAMIN B1) injection 100 mg, 100 mg, Intravenous, Daily, Sundil, Subrina, MD, 100 mg at 10/30/24 0457  Allergies: Allergies  Allergen Reactions   Risperidone  And Paliperidone Other (See Comments)    Patient states causes opposite effect (he will not take again), and gave him psychotic thoughts   Tegretol  [Carbamazepine ] Other (See Comments)    Vertigo  Paralysis   Caffeine Palpitations    Majel GORMAN Ramp, FNP

## 2024-10-30 NOTE — Assessment & Plan Note (Addendum)
 Cessation encouraged cessation Nicotine  patch ordered

## 2024-10-30 NOTE — H&P (Signed)
 " History and Physical    Gary [name] FMW:[id number] DOB: 1986/05/17 DOA: 10/29/2024  PCP: Pcp, No   Patient coming from: Home   Chief Complaint:  Chief Complaint  Patient presents with   Abdominal Pain   Cough   ED TRIAGE note:  Patient BIB EMS from home c/o abdominal pain and cough x 1 day.  Etoh on board. Patient denies N/V/D. Hx pancreatitis.     HPI:  Gary [name] is a 38 y.o. male with medical history significant of alcohol  abuse, recurrent alcoholic pancreatitis, anxiety, bipolar disorder, PTSD, osteopenia, seizure, HIV, pancreatic insufficiency and GERD presented to emergency department complaining of abdominal pain with associated nausea, vomiting and cough. History of heavy drinking alcohol  last drinking of vodka today afternoon. Patient has recurrent ED visit for alcohol  withdrawal. During my evaluation patient patient is complaining of midepigastric abdominal pain which is radiating towards the chest with associated nausea.  Patient is restless and visible tremor in bilateral hands.  Patient is alert oriented x 4.  Denies any shortness of breath, palpitatio  ED Course:  At presentation to ED patient initially found tachycardic and borderline hypertensive.  Heart rate improved 127 to 94.  Patient is afebrile.  Lab work, CBC showing leukocytes 12.2, low platelet count 136 which is around baseline, normal H&H. Pending blood alcohol  level. CMP showed low bicarb 20, elevated AST 48, elevated ALP 136 and anion gap 23.  Normal lactic acid level.  Blood cultures are in process. Elevated lipase 282.  CT abdomen pelvis showing patchy infiltrate in the right lung.  Also changes consistent with focal pancreatitis of the head and uncinate process.  In the ED patient has been given Zosyn  for concern for aspiration pneumonia, 1 L of NS bolus and IV Dilaudid .   Hospitalist consulted for further eval for management of alcohol  withdrawal, aspiration pneumonia and acute  pancreatitis.  Significant labs in the ED: Lab Orders         Resp panel by RT-PCR (RSV, Flu A&B, Covid) Anterior Nasal Swab         Blood culture (routine x 2)         Expectorated Sputum Assessment w Gram Stain, Rflx to Resp Cult         Lipase, blood         Comprehensive metabolic panel         CBC         Urinalysis, Routine w reflex microscopic -Urine, Clean Catch         Ethanol         Comprehensive metabolic panel         CBC         T-helper cells (CD4) count (not at [facility])         HIV-1/HIV-2 Qual RNA       Review of Systems:  Review of Systems  Constitutional:  Negative for chills, fever and weight loss.  Respiratory:  Negative for cough and shortness of breath.   Cardiovascular:  Negative for chest pain and palpitations.  Gastrointestinal:  Positive for abdominal pain, nausea and vomiting. Negative for blood in stool, constipation, diarrhea and melena.  Neurological:  Positive for tremors. Negative for dizziness, seizures, loss of consciousness and headaches.  Psychiatric/Behavioral:  Negative for depression, hallucinations, memory loss, substance abuse and suicidal ideas. The patient has insomnia. The patient is not nervous/anxious.     Past Medical History:  Diagnosis Date   Alcohol  abuse    Alcohol -induced  pancreatitis 04/16/2022   Anxiety    Bipolar 2 disorder (HCC)    HIV (human immunodeficiency virus infection) (HCC)    Pancreatitis    PTSD (post-traumatic stress disorder)    Schizophrenia (HCC)    Seizures (HCC)    Subdural hematoma (HCC)     Past Surgical History:  Procedure Laterality Date   BIOPSY  04/19/2022   Procedure: BIOPSY;  Surgeon: Wilhelmenia Aloha Raddle., MD;  Location: [facility] ENDOSCOPY;  Service: Gastroenterology;;   ENTEROSCOPY N/A 04/19/2022   Procedure: ENTEROSCOPY;  Surgeon: Wilhelmenia Aloha Raddle., MD;  Location: [facility] ENDOSCOPY;  Service: Gastroenterology;  Laterality: N/A;   INCISION AND DRAINAGE PERIRECTAL ABSCESS N/A 09/24/2016    Procedure: IRRIGATION AND DEBRIDEMENT PERIRECTAL ABSCESS;  Surgeon: Carlin Pastel, MD;  Location: [facility] ORS;  Service: General;  Laterality: N/A;   none       reports that he has been smoking cigarettes. He started smoking about 21 years ago. He has a 11 pack-year smoking history. He has been exposed to tobacco smoke. He has never used smokeless tobacco. He reports current alcohol  use of about 105.0 standard drinks of alcohol  per week. He reports that he does not currently use drugs after having used the following drugs: Methamphetamines and Cocaine.  Allergies[1]  Family History  Problem Relation Age of Onset   Hypertension Mother    Alcohol  abuse Mother    Alcoholism Mother    Alcohol  abuse Father    Colon cancer Other    Other Other    Cancer Other     Prior to Admission medications  Medication Sig Start Date End Date Taking? Authorizing Provider  bictegravir-emtricitabine -tenofovir  AF (BIKTARVY ) 50-200-25 MG TABS tablet Take 1 tablet by mouth daily. Patient not taking: Reported on 10/30/2024 09/21/24   Garrick Charleston, MD  levETIRAcetam  (KEPPRA ) 500 MG tablet Take 1 tablet (500 mg total) by mouth 2 (two) times daily. Patient not taking: Reported on 10/30/2024 09/21/24   Garrick Charleston, MD  lipase/protease/amylase (CREON ) 36000 UNITS CPEP capsule Take 1 capsule (36,000 Units total) by mouth 3 (three) times daily before meals. Patient not taking: Reported on 10/30/2024 05/26/24   Amin, Sumayya, MD  Multiple Vitamin (MULTIVITAMIN WITH MINERALS) TABS tablet Take 1 tablet by mouth daily. Patient not taking: Reported on 10/30/2024 10/12/24   Cole Kandi BROCKS, MD  sulfamethoxazole -trimethoprim  (BACTRIM  DS) 800-160 MG tablet Take 1 tablet by mouth every Monday, Wednesday, and Friday. Patient not taking: Reported on 10/30/2024 09/11/24 12/10/24  Dezii, Alexandra, DO  famotidine  (PEPCID ) 20 MG tablet Take 1 tablet (20 mg total) by mouth 2 (two) times daily. Patient not taking: Reported on  06/01/2019 05/14/19 06/02/19  [name], MD     Physical Exam: Vitals:   10/30/24 0016 10/30/24 0115 10/30/24 0245 10/30/24 0316  BP: 127/60 118/63 119/72 122/65  Pulse: (!) 122 (!) 114 (!) 126 94  Resp:  20 16 18   Temp:    100.2 F (37.9 C)  TempSrc:    Oral  SpO2:  90% 93% 93%    Physical Exam Vitals and nursing note reviewed.  Constitutional:      Appearance: He is ill-appearing.  Cardiovascular:     Rate and Rhythm: Regular rhythm. Tachycardia present.  Abdominal:     General: Bowel sounds are normal.     Palpations: Abdomen is soft.     Tenderness: There is abdominal tenderness in the epigastric area. There is no guarding or rebound.  Skin:    Capillary Refill: Capillary refill takes less than 2  seconds.  Neurological:     Mental Status: He is alert and oriented to person, place, and time.  Psychiatric:        Mood and Affect: Mood is anxious. Mood is not depressed.        Behavior: Behavior normal.      Labs on Admission: I have personally reviewed following labs and imaging studies  CBC: Recent Labs  Lab 10/29/24 2043  WBC 12.2*  HGB 13.8  HCT 42.0  MCV 93.8  PLT 136*   Basic Metabolic Panel: Recent Labs  Lab 10/29/24 2043  NA 141  K 4.0  CL 98  CO2 20*  GLUCOSE 115*  BUN 8  CREATININE 0.61  CALCIUM  9.3   GFR: CrCl cannot be calculated (Unknown ideal weight.). Liver Function Tests: Recent Labs  Lab 10/29/24 2043  AST 48*  ALT 44  ALKPHOS 138*  BILITOT 0.4  PROT 8.1  ALBUMIN 4.4   Recent Labs  Lab 10/29/24 2043  LIPASE 282*   No results for input(s): AMMONIA in the last 168 hours. Coagulation Profile: No results for input(s): INR, PROTIME in the last 168 hours. Cardiac Enzymes: No results for input(s): CKTOTAL, CKMB, CKMBINDEX, TROPONINI, TROPONINIHS in the last 168 hours. BNP (last 3 results) Recent Labs    12/08/23 0452 07/31/24 1715  BNP 78.3 16.9   HbA1C: No results for input(s): HGBA1C in the  last 72 hours. CBG: No results for input(s): GLUCAP in the last 168 hours. Lipid Profile: No results for input(s): CHOL, HDL, LDLCALC, TRIG, CHOLHDL, LDLDIRECT in the last 72 hours. Thyroid  Function Tests: No results for input(s): TSH, T4TOTAL, FREET4, T3FREE, THYROIDAB in the last 72 hours. Anemia Panel: No results for input(s): VITAMINB12, FOLATE, FERRITIN, TIBC, IRON, RETICCTPCT in the last 72 hours. Urine analysis:    Component Value Date/Time   COLORURINE YELLOW 10/30/2024 0149   APPEARANCEUR CLEAR 10/30/2024 0149   APPEARANCEUR Clear 11/15/2014 1755   LABSPEC 1.037 (H) 10/30/2024 0149   LABSPEC 1.002 11/15/2014 1755   PHURINE 6.0 10/30/2024 0149   GLUCOSEU NEGATIVE 10/30/2024 0149   GLUCOSEU Negative 11/15/2014 1755   HGBUR SMALL (A) 10/30/2024 0149   BILIRUBINUR NEGATIVE 10/30/2024 0149   BILIRUBINUR Negative 11/15/2014 1755   KETONESUR 80 (A) 10/30/2024 0149   PROTEINUR >=300 (A) 10/30/2024 0149   NITRITE NEGATIVE 10/30/2024 0149   LEUKOCYTESUR NEGATIVE 10/30/2024 0149   LEUKOCYTESUR Negative 11/15/2014 1755    Radiological Exams on Admission: I have personally reviewed images CT CHEST ABDOMEN PELVIS W CONTRAST Result Date: 10/30/2024 CLINICAL DATA:  Abdominal pain for 1 day, history of pancreatitis EXAM: CT CHEST, ABDOMEN, AND PELVIS WITH CONTRAST TECHNIQUE: Multidetector CT imaging of the chest, abdomen and pelvis was performed following the standard protocol during bolus administration of intravenous contrast. RADIATION DOSE REDUCTION: This exam was performed according to the departmental dose-optimization program which includes automated exposure control, adjustment of the mA and/or kV according to patient size and/or use of iterative reconstruction technique. CONTRAST:  OMNIPAQUE  IOHEXOL  300 MG/ML  SOLN COMPARISON:  06/16/2024 FINDINGS: CT CHEST FINDINGS Cardiovascular: Mild atherosclerotic calcifications of the aorta are noted.  No aneurysmal dilatation is seen. Heart is not significantly enlarged in size. Minimal coronary calcifications are noted. Pulmonary artery as visualized is within normal limits although not timed for embolus evaluation. Mediastinum/Nodes: Thoracic inlet is within normal limits. No hilar or mediastinal adenopathy is noted. The esophagus is unremarkable. Lungs/Pleura: The lungs are well aerated bilaterally. Focal consolidation is noted in right lower lobe centrally.  Patchy infiltrate is also noted in the inferior aspect of the right middle lobe. These changes are consistent with multifocal infiltrate. Musculoskeletal: Degenerative changes of the thoracic spine are noted. CT ABDOMEN PELVIS FINDINGS Hepatobiliary: Fatty infiltration of the liver is noted. The gallbladder is unremarkable. Pancreas: Pancreas is well visualized without evidence of pancreatic necrosis the tail appears atrophic. Some peripancreatic inflammatory changes and fluid are noted surrounding the head and uncinate process consistent with focal pancreatitis. Spleen: Normal in size without focal abnormality. Adrenals/Urinary Tract: Adrenal glands are within normal limits. Kidneys demonstrate a normal enhancement pattern. Small 1 cm cyst is noted in the lower pole of the right kidney. This appears simple in nature and no further follow-up is recommended. No obstructive changes are seen. The bladder is over distended. Stomach/Bowel: Stomach is within normal limits. Appendix appears normal. No evidence of bowel wall thickening, distention, or inflammatory changes. Small bowel and stomach are unremarkable. Vascular/Lymphatic: No significant vascular findings are present. No enlarged abdominal or pelvic lymph nodes. Reproductive: Prostate is unremarkable. Other: No abdominal wall hernia or abnormality. No abdominopelvic ascites. Musculoskeletal: No acute or significant osseous findings. IMPRESSION: CT of the chest: Patchy multifocal infiltrate in the right  lung. CT of the abdomen and pelvis: Changes consistent with focal pancreatitis in the head and uncinate process. Electronically Signed   By: Oneil Devonshire M.D.   On: 10/30/2024 00:23     EKG: My personal interpretation of EKG shows: Sinus tachycardia heart rate 126.  Assessment/Plan: Principal Problem:   Acute alcoholic pancreatitis Active Problems:   Aspiration pneumonia (HCC)   Alcohol  withdrawal (HCC)   High anion gap metabolic acidosis   HIV (human immunodeficiency virus infection) (HCC)   Bipolar disorder (HCC)   Thrombocytopenia   GAD (generalized anxiety disorder)   PTSD (post-traumatic stress disorder)   SIRS (systemic inflammatory response syndrome) (HCC)   History of seizure    Assessment and Plan: Acute alcoholic pancreatitis -Patient presented to emergency department complaining of abdominal pain associate nausea vomiting and cough.  At presentation to ED patient found tachycardic otherwise hemodynamically stable.  Afebrile.  CBC showing leukocytosis and chronic, cytopenia.  CMP showing evidence of chronic transaminitis and INR chimeric acidosis.  Lipase. - CT abdomen pelvis showing acute pancreatitis of uncinate process and pancreatic head. - History of chronic alcohol  use disorder and last drink of alcohol  that afternoon. - In the ED patient received 1 L of NS bolus.  Giving 1 L of LR bolus followed by continue maintenance fluid LR 125 cc/h.   -Continue Dilaudid  0.5 to 1 mg every 4 hour as needed for moderate to severe pain control and IV morphine  1 mg every 2 hours as needed for breakthrough pain. -Starting clear liquid diet.  Advance diet as patient tolerates. - Continue Zofran  as needed.  Aspiration pneumonia SIRS -Patient meets SIRS criteria.  Chest x-ray showing evidence for aspiration pneumonia. - In the ED patient received IV Zosyn . - Lactic acid within normal range.  Checking sputum and blood cultures. - Continue IV Unasyn  with pharmacy consult.  Need to  follow-up with culture results.  Alcohol  withdrawal -Continue CIWA protocol with IV Ativan  as needed.  Continue thiamine , folic acid  and multivitamin.  High anion gap elevated acidosis -Low bicarb 20 and elevated anion gap 23. HAGMA in the setting of nausea vomiting.  Cannot resuscitate IV fluid and monitor bicarb level.  History of HIV -Patient reported noncompliance with Biktarvy .  Checking CD4 cell count and HIV viral load. On discharge need to  refer to ID to reestablish care.   Chronic thrombocytopenia -Low platelet count 136 which is around baseline.  Continue to monitor.  History of seizure -Continue IV Keppra  500 mg twice daily.  Continue Ativan  2 mg every 6 as needed for seizure.  Once nausea and vomiting resolves can transition to oral Keppra  500 mg twice daily.   History of bipolar disorder, generalized anxiety disorder and PTSD -Currently not taking any medications at home.  -Consulting psychiatry for inpatient evaluation.   DVT prophylaxis:  Lovenox  Code Status:  Full Code Diet: Heart healthy diet. Family Communication:   Family was present at bedside, at the time of interview. Opportunity was given to ask question and all questions were answered satisfactorily.  Disposition Plan: Need to follow-up with blood culture sputum culture result.  Advance diet as patient tolerates in terms of improvement of abdominal pain and nausea vomiting. Consults: Psychiatry and case management Admission status:   Inpatient, Telemetry bed  Severity of Illness: The appropriate patient status for this patient is INPATIENT. Inpatient status is judged to be reasonable and necessary in order to provide the required intensity of service to ensure the patient's safety. The patient's presenting symptoms, physical exam findings, and initial radiographic and laboratory data in the context of their chronic comorbidities is felt to place them at high risk for further clinical deterioration.  Furthermore, it is not anticipated that the patient will be medically stable for discharge from the hospital within 2 midnights of admission.   * I certify that at the point of admission it is my clinical judgment that the patient will require inpatient hospital care spanning beyond 2 midnights from the point of admission due to high intensity of service, high risk for further deterioration and high frequency of surveillance required.Gary [name]    Mattalyn Anderegg, MD [facility]  How to contact the [facility] Attending or Consulting provider 7A - 7P or covering provider during after hours 7P -7A, for this patient.  Check the care team in [facility] and look for a) attending/consulting [facility] provider listed and b) the [facility] team listed Log into [email or web address] and use [facility]'s universal password to access. If you do not have the password, please contact the hospital operator. Locate the [facility] provider you are looking for under [facility] and page to a number that you can be directly reached. If you still have difficulty reaching the provider, please page the [facility] (Director on Call) for the Hospitalists listed on amion for assistance.  10/30/2024, 4:37 AM           [1]  Allergies Allergen Reactions   Risperidone  And Paliperidone Other (See Comments)    Patient states causes opposite effect (he will not take again), and gave him psychotic thoughts   Tegretol  [Carbamazepine ] Other (See Comments)    Vertigo  Paralysis   Caffeine Palpitations   "

## 2024-10-31 DIAGNOSIS — R7881 Bacteremia: Secondary | ICD-10-CM | POA: Diagnosis not present

## 2024-10-31 DIAGNOSIS — J690 Pneumonitis due to inhalation of food and vomit: Secondary | ICD-10-CM | POA: Diagnosis not present

## 2024-10-31 DIAGNOSIS — K852 Alcohol induced acute pancreatitis without necrosis or infection: Secondary | ICD-10-CM | POA: Diagnosis not present

## 2024-10-31 LAB — BLOOD CULTURE ID PANEL (REFLEXED) - BCID2

## 2024-10-31 LAB — HIV-1/HIV-2 QUAL RNA
HIV-1 RNA, Qualitative: REACTIVE — AB
HIV-2 RNA, Qualitative: NONREACTIVE

## 2024-10-31 MED ORDER — VANCOMYCIN HCL 1250 MG/250ML IV SOLN
1250.0000 mg | Freq: Two times a day (BID) | INTRAVENOUS | Status: DC
  Administered 2024-10-31 – 2024-11-04 (×9): 1250 mg via INTRAVENOUS
  Filled 2024-10-31 (×10): qty 250

## 2024-10-31 NOTE — Plan of Care (Signed)
  Problem: Clinical Measurements: Goal: Respiratory complications will improve Outcome: Progressing Goal: Cardiovascular complication will be avoided Outcome: Progressing   Problem: Nutrition: Goal: Adequate nutrition will be maintained Outcome: Progressing   Problem: Elimination: Goal: Will not experience complications related to urinary retention Outcome: Progressing   Problem: Pain Managment: Goal: General experience of comfort will improve and/or be controlled Outcome: Progressing

## 2024-10-31 NOTE — Progress Notes (Signed)
 PHARMACY - PHYSICIAN COMMUNICATION CRITICAL VALUE ALERT - BLOOD CULTURE IDENTIFICATION (BCID)  Gary [name] is an 38 y.o. male who presented to [facility] on 10/29/2024 with a chief complaint of  Chief Complaint  Patient presents with   Abdominal Pain   Cough     Assessment (updated results at 0738):  2/4 ( both anaerobic) GPC, nothing identified on BCID, 2/4 bottles growing GPR   Name of physician (or Provider) Contacted: Evonnie Lenis, MD   Current antibiotics: Unasyn  and Vancomycin   Changes to prescribed antibiotics recommended:  - None, continue current therapy    Results for orders placed or performed during the hospital encounter of 10/29/24  Blood Culture ID Panel (Reflexed) (Collected: 10/30/2024  1:49 AM)  Result Value Ref Range   Enterococcus faecalis NOT DETECTED NOT DETECTED   Enterococcus Faecium NOT DETECTED NOT DETECTED   Listeria monocytogenes NOT DETECTED NOT DETECTED   Staphylococcus species NOT DETECTED NOT DETECTED   Staphylococcus aureus (BCID) NOT DETECTED NOT DETECTED   Staphylococcus epidermidis NOT DETECTED NOT DETECTED   Staphylococcus lugdunensis NOT DETECTED NOT DETECTED   Streptococcus species NOT DETECTED NOT DETECTED   Streptococcus agalactiae NOT DETECTED NOT DETECTED   Streptococcus pneumoniae NOT DETECTED NOT DETECTED   Streptococcus pyogenes NOT DETECTED NOT DETECTED   A.calcoaceticus-baumannii NOT DETECTED NOT DETECTED   Bacteroides fragilis NOT DETECTED NOT DETECTED   Enterobacterales NOT DETECTED NOT DETECTED   Enterobacter cloacae complex NOT DETECTED NOT DETECTED   Escherichia coli NOT DETECTED NOT DETECTED   Klebsiella aerogenes NOT DETECTED NOT DETECTED   Klebsiella oxytoca NOT DETECTED NOT DETECTED   Klebsiella pneumoniae NOT DETECTED NOT DETECTED   Proteus species NOT DETECTED NOT DETECTED   Salmonella species NOT DETECTED NOT DETECTED   Serratia marcescens NOT DETECTED NOT DETECTED   Haemophilus influenzae NOT DETECTED NOT  DETECTED   Neisseria meningitidis NOT DETECTED NOT DETECTED   Pseudomonas aeruginosa NOT DETECTED NOT DETECTED   Stenotrophomonas maltophilia NOT DETECTED NOT DETECTED   Candida albicans NOT DETECTED NOT DETECTED   Candida auris NOT DETECTED NOT DETECTED   Candida glabrata NOT DETECTED NOT DETECTED   Candida krusei NOT DETECTED NOT DETECTED   Candida parapsilosis NOT DETECTED NOT DETECTED   Candida tropicalis NOT DETECTED NOT DETECTED   Cryptococcus neoformans/gattii NOT DETECTED NOT DETECTED     Dolphus Roller, PharmD, BCPS 10/31/2024 7:58 AM

## 2024-10-31 NOTE — Progress Notes (Addendum)
 "          PROGRESS NOTE  Gary [name] FMW:[id number] DOB: [birth year] DOA: 10/29/2024 PCP: Pcp, No  Brief History:  38yo with h/o alcohol  abuse, chronic pancreatitis with frequent hospitalizations for acute on chronic pancreatitis, HIV/AIDS, medication noncompliance, seizure disorder, bipolar schizophrenia, PTSD and homelessness who has had frequent recent ER visits who presented on 12/21 with abdominal pain.  Continues with active drinking.  CT with focal pancreatitis of the head and uncinate process as well as patchy infiltrate in the R lung.  He was given Zosyn , IV, and Dilaudid .     Assessment/Plan:  Acute alcoholic pancreatitis Acute on chronic pancreatitis in the setting of continued alcohol  abuse CT abdomen pelvis showing acute pancreatitis of uncinate process and pancreatic head -lipase 282 History of chronic alcohol  use disorder and last drink of alcohol  on day of presentation--Etoh level 23 on 12/22 Given IVF, Dilaudid  Starting clear liquid diet.  Advance diet as patient tolerates. Continue Zofran  as needed 43 ER visits and 7 admissions in the last 6 months Needs complete abstinence/cessation of alcohol  given his recurrent pancreatitis events, as this is the etiology to his multiple hospitalizations and ED visits Not currently taking Creon   Aspiration pneumonia (HCC) SIRS (systemic inflammatory response syndrome) (HCC) Patient meets SIRS criteria with fever to 102.7, tachycardia, tachypnea Chest x-ray showing evidence for aspiration pneumonia In the ED patient received IV Zosyn  -> Unasyn  Lactic acid within normal range  Alcohol  withdrawal (HCC) Patient with continued alcohol  abuse, last drink day of admission, shortly after last dc Monitor on CIWA protocol  Counsel on complete alcohol  cessation/abstinence He has had severe withdrawal on prior hospitalizations Transfer to SDU Start phenobarbital  taper per pharmacy Psychiatry consulted PCCM consulted; if he transitions  to IV Phenobarb, they will assume care  Gram positive bacteremia (HCC) 12/22 blood culture = staph species on BCID; also has GPR -continue vanc pending culture data -follow blood culture  High anion gap metabolic acidosis Mild AGMA in the setting of nausea/vomiting Treated with IVF, continuous infusion for now at 125 cc/hr  HIV (human immunodeficiency virus infection) (HCC) Asymptomatic Not currently taking Biktarvy , Bactrim  Outpatient follow-up with infectious disease -CD 4 161/36%  Bipolar disorder (HCC) PTSD (post-traumatic stress disorder) GAD (generalized anxiety disorder) Previously on Seroquel , not currently taking medication Psychiatry consulted, recommends increasing gabapentin  dose He does not currently meet IVC crtieria  Thrombocytopenia Likely associated with bone marrow suppression in the setting of alcohol  use Values have been fairly highly variable on prior checks Will follow for now  History of seizure Associated with ETOH withdrawal, last admitted for this issue from 10/29-11/3 Has not been taking Keppra  at home Will start IV Keppra  500 mg twice daily Continue Ativan  2 mg every 6 as needed for seizure Once nausea and vomiting resolves can transition to oral Keppra   Seizure precautions ordered  Tobacco use disorder Cessation encouraged cessation Nicotine  patch ordered         Consultants: Psychiatry PCCM TOC team   Procedures: None   Antibiotics: Unasyn  12/22- Zosyn  x 1         Family Communication:  no Family at bedside   Code Status:  FULL   DVT Prophylaxis:    Lovenox    Procedures: As Listed in Progress Note Above  Antibiotics: Vanc 12.22>> Unasyn  12/22>>     Subjective: Pt awakens to voice.  Denies cp, sob.  Complains pain all over.  ROS limited due to confusion and somnolence  Objective: Vitals:   10/31/24 0318  10/31/24 0400 10/31/24 0500 10/31/24 0600  BP:  126/78 111/78 118/83  Pulse:  97 (!) 101 98   Resp:  (!) 22 16 14   Temp: 99.6 F (37.6 C)     TempSrc: Oral     SpO2:  91% 93% 93%  Weight:      Height:        Intake/Output Summary (Last 24 hours) at 10/31/2024 0811 Last data filed at 10/31/2024 9350 Gross per 24 hour  Intake 2574.22 ml  Output 1775 ml  Net 799.22 ml   Weight change:  Exam:  General:  Pt is alert to voice, follows commands appropriately, not in acute distress HEENT: No icterus, No thrush, No neck mass, [place]/AT Cardiovascular: RRR, S1/S2, no rubs, no gallops Respiratory: bibasilar rales Abdomen: Soft/+BS, non tender, non distended, no guarding Extremities: No edema, No lymphangitis, No petechiae, No rashes, no synovitis   Data Reviewed: I have personally reviewed following labs and imaging studies Basic Metabolic Panel: Recent Labs  Lab 10/29/24 2043 10/30/24 0614  NA 141 137  K 4.0 3.8  CL 98 98  CO2 20* 19*  GLUCOSE 115* 92  BUN 8 <5*  CREATININE 0.61 0.59*  CALCIUM  9.3 8.6*   Liver Function Tests: Recent Labs  Lab 10/29/24 2043 10/30/24 0614  AST 48* 34  ALT 44 29  ALKPHOS 138* 109  BILITOT 0.4 0.5  PROT 8.1 6.5  ALBUMIN 4.4 3.5   Recent Labs  Lab 10/29/24 2043  LIPASE 282*   No results for input(s): AMMONIA in the last 168 hours. Coagulation Profile: No results for input(s): INR, PROTIME in the last 168 hours. CBC: Recent Labs  Lab 10/29/24 2043 10/30/24 0801  WBC 12.2* 8.1  HGB 13.8 10.4*  HCT 42.0 31.6*  MCV 93.8 92.7  PLT 136* 84*   Cardiac Enzymes: No results for input(s): CKTOTAL, CKMB, CKMBINDEX, TROPONINI in the last 168 hours. BNP: Invalid input(s): POCBNP CBG: No results for input(s): GLUCAP in the last 168 hours. HbA1C: No results for input(s): HGBA1C in the last 72 hours. Urine analysis:    Component Value Date/Time   COLORURINE YELLOW 10/30/2024 0149   APPEARANCEUR CLEAR 10/30/2024 0149   APPEARANCEUR Clear 11/15/2014 1755   LABSPEC 1.037 (H) 10/30/2024 0149   LABSPEC  1.002 11/15/2014 1755   PHURINE 6.0 10/30/2024 0149   GLUCOSEU NEGATIVE 10/30/2024 0149   GLUCOSEU Negative 11/15/2014 1755   HGBUR SMALL (A) 10/30/2024 0149   BILIRUBINUR NEGATIVE 10/30/2024 0149   BILIRUBINUR Negative 11/15/2014 1755   KETONESUR 80 (A) 10/30/2024 0149   PROTEINUR >=300 (A) 10/30/2024 0149   NITRITE NEGATIVE 10/30/2024 0149   LEUKOCYTESUR NEGATIVE 10/30/2024 0149   LEUKOCYTESUR Negative 11/15/2014 1755   Sepsis Labs: @LABRCNTIP (procalcitonin:4,lacticidven:4) ) Recent Results (from the past 240 hours)  Resp panel by RT-PCR (RSV, Flu A&B, Covid) Anterior Nasal Swab     Status: None   Collection Time: 10/29/24  8:43 PM   Specimen: Anterior Nasal Swab  Result Value Ref Range Status   SARS Coronavirus 2 by RT PCR NEGATIVE NEGATIVE Final    Comment: (NOTE) SARS-CoV-2 target nucleic acids are NOT DETECTED.  The SARS-CoV-2 RNA is generally detectable in upper respiratory specimens during the acute phase of infection. The lowest concentration of SARS-CoV-2 viral copies this assay can detect is 138 copies/mL. A negative result does not preclude SARS-Cov-2 infection and should not be used as the sole basis for treatment or other patient management decisions. A negative result may occur with  improper specimen  collection/handling, submission of specimen other than nasopharyngeal swab, presence of viral mutation(s) within the areas targeted by this assay, and inadequate number of viral copies(<138 copies/mL). A negative result must be combined with clinical observations, patient history, and epidemiological information. The expected result is Negative.  Fact Sheet for Patients:  bloggercourse.com  Fact Sheet for Healthcare Providers:  seriousbroker.it  This test is no t yet approved or cleared by the [place]  FDA and  has been authorized for detection and/or diagnosis of SARS-CoV-2 by FDA under an Emergency Use  Authorization (EUA). This EUA will remain  in effect (meaning this test can be used) for the duration of the COVID-19 declaration under Section 564(b)(1) of the Act, 21 U.S.C.section 360bbb-3(b)(1), unless the authorization is terminated  or revoked sooner.       Influenza A by PCR NEGATIVE NEGATIVE Final   Influenza B by PCR NEGATIVE NEGATIVE Final    Comment: (NOTE) The Xpert Xpress SARS-CoV-2/FLU/RSV plus assay is intended as an aid in the diagnosis of influenza from Nasopharyngeal swab specimens and should not be used as a sole basis for treatment. Nasal washings and aspirates are unacceptable for Xpert Xpress SARS-CoV-2/FLU/RSV testing.  Fact Sheet for Patients: bloggercourse.com  Fact Sheet for Healthcare Providers: seriousbroker.it  This test is not yet approved or cleared by the [place]  FDA and has been authorized for detection and/or diagnosis of SARS-CoV-2 by FDA under an Emergency Use Authorization (EUA). This EUA will remain in effect (meaning this test can be used) for the duration of the COVID-19 declaration under Section 564(b)(1) of the Act, 21 U.S.C. section 360bbb-3(b)(1), unless the authorization is terminated or revoked.     Resp Syncytial Virus by PCR NEGATIVE NEGATIVE Final    Comment: (NOTE) Fact Sheet for Patients: bloggercourse.com  Fact Sheet for Healthcare Providers: seriousbroker.it  This test is not yet approved or cleared by the [place]  FDA and has been authorized for detection and/or diagnosis of SARS-CoV-2 by FDA under an Emergency Use Authorization (EUA). This EUA will remain in effect (meaning this test can be used) for the duration of the COVID-19 declaration under Section 564(b)(1) of the Act, 21 U.S.C. section 360bbb-3(b)(1), unless the authorization is terminated or revoked.  Performed at [facility],  [address] [place], [place] [address]   Blood culture (routine x 2)     Status: None (Preliminary result)   Collection Time: 10/30/24  1:49 AM   Specimen: BLOOD  Result Value Ref Range Status   Specimen Description   Final    BLOOD RIGHT ANTECUBITAL Performed at [facility], [address], [place], [place] [address]    Special Requests   Final    BOTTLES DRAWN AEROBIC AND ANAEROBIC Blood Culture results may not be optimal due to an inadequate volume of blood received in culture bottles Performed at [facility], [address], [place], [place] [address]    Culture  Setup Time   Final    GRAM POSITIVE COCCI ANAEROBIC BOTTLE ONLY CRITICAL RESULT CALLED TO, READ BACK BY AND VERIFIED WITH:   MITCHELL LILLISTON PHARMD 10/31/2024 @ 0429 BY DD GRAM POSITIVE RODS AEROBIC BOTTLE ONLY RBV CHRISTINE SHADE PHARM D 10/31/2024 @0738  BY DD Performed at [facility], [address] [place], [place] [address]    Culture GRAM POSITIVE COCCI GRAM POSITIVE RODS   Final   Report Status PENDING  Incomplete  Blood culture (routine x 2)     Status: None (  Preliminary result)   Collection Time: 10/30/24  1:49 AM   Specimen: BLOOD LEFT HAND  Result Value Ref Range Status   Specimen Description   Final    BLOOD LEFT HAND Performed at [facility], [address], [place], [place] [address]    Special Requests   Final    BOTTLES DRAWN AEROBIC AND ANAEROBIC Blood Culture results may not be optimal due to an inadequate volume of blood received in culture bottles Performed at [facility], [address], [place], [place] [address]    Culture  Setup Time   Final    GRAM POSITIVE COCCI ANAEROBIC BOTTLE ONLY CRITICAL VALUE NOTED.  VALUE IS CONSISTENT WITH PREVIOUSLY REPORTED AND CALLED VALUE. GRAM POSITIVE RODS AEROBIC BOTTLE ONLY CRITICAL RESULT CALLED TO, READ BACK BY AND VERIFIED WITH: PHARMD [email or web address].G AT 0630 ON 10/31/2024 BY  T.SAAD. Performed at [facility], [address] [place], [place] [address]    Culture GRAM POSITIVE COCCI GRAM POSITIVE RODS   Final   Report Status PENDING  Incomplete  Blood Culture ID Panel (Reflexed)     Status: None   Collection Time: 10/30/24  1:49 AM  Result Value Ref Range Status   Enterococcus faecalis NOT DETECTED NOT DETECTED Final   Enterococcus Faecium NOT DETECTED NOT DETECTED Final   Listeria monocytogenes NOT DETECTED NOT DETECTED Final   Staphylococcus species NOT DETECTED NOT DETECTED Final   Staphylococcus aureus (BCID) NOT DETECTED NOT DETECTED Final   Staphylococcus epidermidis NOT DETECTED NOT DETECTED Final   Staphylococcus lugdunensis NOT DETECTED NOT DETECTED Final   Streptococcus species NOT DETECTED NOT DETECTED Final   Streptococcus agalactiae NOT DETECTED NOT DETECTED Final   Streptococcus pneumoniae NOT DETECTED NOT DETECTED Final   Streptococcus pyogenes NOT DETECTED NOT DETECTED Final   A.calcoaceticus-baumannii NOT DETECTED NOT DETECTED Final   Bacteroides fragilis NOT DETECTED NOT DETECTED Final   Enterobacterales NOT DETECTED NOT DETECTED Final   Enterobacter cloacae complex NOT DETECTED NOT DETECTED Final   Escherichia coli NOT DETECTED NOT DETECTED Final   Klebsiella aerogenes NOT DETECTED NOT DETECTED Final   Klebsiella oxytoca NOT DETECTED NOT DETECTED Final   Klebsiella pneumoniae NOT DETECTED NOT DETECTED Final   Proteus species NOT DETECTED NOT DETECTED Final   Salmonella species NOT DETECTED NOT DETECTED Final   Serratia marcescens NOT DETECTED NOT DETECTED Final   Haemophilus influenzae NOT DETECTED NOT DETECTED Final   Neisseria meningitidis NOT DETECTED NOT DETECTED Final   Pseudomonas aeruginosa NOT DETECTED NOT DETECTED Final   Stenotrophomonas maltophilia NOT DETECTED NOT DETECTED Final   Candida albicans NOT DETECTED NOT DETECTED Final   Candida auris NOT DETECTED NOT DETECTED Final   Candida glabrata NOT DETECTED NOT  DETECTED Final   Candida krusei NOT DETECTED NOT DETECTED Final   Candida parapsilosis NOT DETECTED NOT DETECTED Final   Candida tropicalis NOT DETECTED NOT DETECTED Final   Cryptococcus neoformans/gattii NOT DETECTED NOT DETECTED Final    Comment: Performed at [facility], [address], [place], [place] [address]  MRSA Next Gen by PCR, Nasal     Status: None   Collection Time: 10/30/24  2:07 PM   Specimen: Nasal Mucosa; Nasal Swab  Result Value Ref Range Status   MRSA by PCR Next Gen NOT DETECTED NOT DETECTED Final    Comment: (NOTE) The GeneXpert MRSA Assay (FDA approved for NASAL specimens only), is one component of a comprehensive MRSA colonization surveillance program. It is not  intended to diagnose MRSA infection nor to guide or monitor treatment for MRSA infections. Test performance is not FDA approved in patients less than 65 years old. Performed at [facility], [address] [place], [place] [address]      Scheduled Meds:  Chlorhexidine  Gluconate Cloth  6 each Topical Daily   enoxaparin  (LOVENOX ) injection  40 mg Subcutaneous Q24H   folic acid   1 mg Oral Daily   levETIRAcetam   500 mg Intravenous Q12H   multivitamin with minerals  1 tablet Oral Daily   phenobarbital   97.2 mg Oral Q8H   Followed by   NOREEN ON 11/01/2024] phenobarbital   64.8 mg Oral Q8H   Followed by   NOREEN ON 11/03/2024] phenobarbital   32.4 mg Oral Q8H   sodium chloride  flush  3 mL Intravenous Q12H   sodium chloride  flush  3 mL Intravenous Q12H   thiamine  (VITAMIN B1) injection  100 mg Intravenous Daily   Continuous Infusions:  ampicillin -sulbactam (UNASYN ) IV Stopped (10/31/24 0827)   lactated ringers  125 mL/hr at 10/31/24 0649   vancomycin  Stopped (10/31/24 9347)    Procedures/Studies: CT CHEST ABDOMEN PELVIS W CONTRAST Result Date: 10/30/2024 CLINICAL DATA:  Abdominal pain for 1 day, history of pancreatitis EXAM: CT CHEST, ABDOMEN, AND PELVIS WITH CONTRAST  TECHNIQUE: Multidetector CT imaging of the chest, abdomen and pelvis was performed following the standard protocol during bolus administration of intravenous contrast. RADIATION DOSE REDUCTION: This exam was performed according to the departmental dose-optimization program which includes automated exposure control, adjustment of the mA and/or kV according to patient size and/or use of iterative reconstruction technique. CONTRAST:  OMNIPAQUE  IOHEXOL  300 MG/ML  SOLN COMPARISON:  06/16/2024 FINDINGS: CT CHEST FINDINGS Cardiovascular: Mild atherosclerotic calcifications of the aorta are noted. No aneurysmal dilatation is seen. Heart is not significantly enlarged in size. Minimal coronary calcifications are noted. Pulmonary artery as visualized is within normal limits although not timed for embolus evaluation. Mediastinum/Nodes: Thoracic inlet is within normal limits. No hilar or mediastinal adenopathy is noted. The esophagus is unremarkable. Lungs/Pleura: The lungs are well aerated bilaterally. Focal consolidation is noted in right lower lobe centrally. Patchy infiltrate is also noted in the inferior aspect of the right middle lobe. These changes are consistent with multifocal infiltrate. Musculoskeletal: Degenerative changes of the thoracic spine are noted. CT ABDOMEN PELVIS FINDINGS Hepatobiliary: Fatty infiltration of the liver is noted. The gallbladder is unremarkable. Pancreas: Pancreas is well visualized without evidence of pancreatic necrosis the tail appears atrophic. Some peripancreatic inflammatory changes and fluid are noted surrounding the head and uncinate process consistent with focal pancreatitis. Spleen: Normal in size without focal abnormality. Adrenals/Urinary Tract: Adrenal glands are within normal limits. Kidneys demonstrate a normal enhancement pattern. Small 1 cm cyst is noted in the lower pole of the right kidney. This appears simple in nature and no further follow-up is recommended. No  obstructive changes are seen. The bladder is over distended. Stomach/Bowel: Stomach is within normal limits. Appendix appears normal. No evidence of bowel wall thickening, distention, or inflammatory changes. Small bowel and stomach are unremarkable. Vascular/Lymphatic: No significant vascular findings are present. No enlarged abdominal or pelvic lymph nodes. Reproductive: Prostate is unremarkable. Other: No abdominal wall hernia or abnormality. No abdominopelvic ascites. Musculoskeletal: No acute or significant osseous findings. IMPRESSION: CT of the chest: Patchy multifocal infiltrate in the right lung. CT of the abdomen and pelvis: Changes consistent with focal pancreatitis in the head and uncinate process. Electronically Signed   By: Oneil Evelyne HERO.D.  On: 10/30/2024 00:23   DG Chest Port 1 View Result Date: 10/12/2024 CLINICAL DATA:  Shortness of breath EXAM: PORTABLE CHEST 1 VIEW COMPARISON:  Chest x-ray 08/15/2024 FINDINGS: The heart size and mediastinal contours are within normal limits. Both lungs are clear. There is mild elevation of the left hemidiaphragm. The visualized skeletal structures are unremarkable. IMPRESSION: No active disease. Electronically Signed   By: Greig Pique M.D.   On: 10/12/2024 21:34    Alm Schneider, DO  [facility]  If 7PM-7AM, please contact night-coverage [email or web address] Password [email or web address] 10/31/2024, 8:11 AM   LOS: 1 day   "

## 2024-10-31 NOTE — Progress Notes (Addendum)
 PHARMACY - PHYSICIAN COMMUNICATION CRITICAL VALUE ALERT - BLOOD CULTURE IDENTIFICATION (BCID)  Gary [name] is an 38 y.o. male who presented to [facility] on 10/29/2024 with a chief complaint of  Chief Complaint  Patient presents with   Abdominal Pain   Cough     Assessment:  2/4 ( both anaerobic) GPC, nothing identified on BCID, 1/4 bottles growing GPR   Name of physician (or Provider) Contacted: Gary Kipper, NP   Current antibiotics: Unasyn   Changes to prescribed antibiotics recommended:  - Add vancomycin   - Vancomycin  1250 mg IV q12h ( AUC 486, Scr to 0.8, wt 68.2 kg,)   Results for orders placed or performed during the hospital encounter of 10/29/24  Blood Culture ID Panel (Reflexed) (Collected: 10/30/2024  1:49 AM)  Result Value Ref Range   Enterococcus faecalis NOT DETECTED NOT DETECTED   Enterococcus Faecium NOT DETECTED NOT DETECTED   Listeria monocytogenes NOT DETECTED NOT DETECTED   Staphylococcus species NOT DETECTED NOT DETECTED   Staphylococcus aureus (BCID) NOT DETECTED NOT DETECTED   Staphylococcus epidermidis NOT DETECTED NOT DETECTED   Staphylococcus lugdunensis NOT DETECTED NOT DETECTED   Streptococcus species NOT DETECTED NOT DETECTED   Streptococcus agalactiae NOT DETECTED NOT DETECTED   Streptococcus pneumoniae NOT DETECTED NOT DETECTED   Streptococcus pyogenes NOT DETECTED NOT DETECTED   A.calcoaceticus-baumannii NOT DETECTED NOT DETECTED   Bacteroides fragilis NOT DETECTED NOT DETECTED   Enterobacterales NOT DETECTED NOT DETECTED   Enterobacter cloacae complex NOT DETECTED NOT DETECTED   Escherichia coli NOT DETECTED NOT DETECTED   Klebsiella aerogenes NOT DETECTED NOT DETECTED   Klebsiella oxytoca NOT DETECTED NOT DETECTED   Klebsiella pneumoniae NOT DETECTED NOT DETECTED   Proteus species NOT DETECTED NOT DETECTED   Salmonella species NOT DETECTED NOT DETECTED   Serratia marcescens NOT DETECTED NOT DETECTED   Haemophilus influenzae NOT  DETECTED NOT DETECTED   Neisseria meningitidis NOT DETECTED NOT DETECTED   Pseudomonas aeruginosa NOT DETECTED NOT DETECTED   Stenotrophomonas maltophilia NOT DETECTED NOT DETECTED   Candida albicans NOT DETECTED NOT DETECTED   Candida auris NOT DETECTED NOT DETECTED   Candida glabrata NOT DETECTED NOT DETECTED   Candida krusei NOT DETECTED NOT DETECTED   Candida parapsilosis NOT DETECTED NOT DETECTED   Candida tropicalis NOT DETECTED NOT DETECTED   Cryptococcus neoformans/gattii NOT DETECTED NOT DETECTED     Dolphus Roller, PharmD, BCPS 10/31/2024 4:55 AM

## 2024-11-01 DIAGNOSIS — K852 Alcohol induced acute pancreatitis without necrosis or infection: Secondary | ICD-10-CM | POA: Diagnosis not present

## 2024-11-01 LAB — CBC WITH DIFFERENTIAL/PLATELET
Abs Immature Granulocytes: 0.02 K/uL (ref 0.00–0.07)
Basophils Absolute: 0 K/uL (ref 0.0–0.1)
Basophils Relative: 0 %
Eosinophils Absolute: 0.1 K/uL (ref 0.0–0.5)
Eosinophils Relative: 2 %
HCT: 33.6 % — ABNORMAL LOW (ref 39.0–52.0)
Hemoglobin: 11.1 g/dL — ABNORMAL LOW (ref 13.0–17.0)
Immature Granulocytes: 0 %
Lymphocytes Relative: 15 %
Lymphs Abs: 0.9 K/uL (ref 0.7–4.0)
MCH: 30.5 pg (ref 26.0–34.0)
MCHC: 33 g/dL (ref 30.0–36.0)
MCV: 92.3 fL (ref 80.0–100.0)
Monocytes Absolute: 0.4 K/uL (ref 0.1–1.0)
Monocytes Relative: 7 %
Neutro Abs: 4.6 K/uL (ref 1.7–7.7)
Neutrophils Relative %: 76 %
Platelets: 85 K/uL — ABNORMAL LOW (ref 150–400)
RBC: 3.64 MIL/uL — ABNORMAL LOW (ref 4.22–5.81)
RDW: 16 % — ABNORMAL HIGH (ref 11.5–15.5)
WBC: 6.1 K/uL (ref 4.0–10.5)
nRBC: 0 % (ref 0.0–0.2)

## 2024-11-01 LAB — COMPREHENSIVE METABOLIC PANEL WITH GFR
ALT: 30 U/L (ref 0–44)
AST: 38 U/L (ref 15–41)
Albumin: 3.4 g/dL — ABNORMAL LOW (ref 3.5–5.0)
Alkaline Phosphatase: 106 U/L (ref 38–126)
Anion gap: 15 (ref 5–15)
BUN: <5 mg/dL — ABNORMAL LOW (ref 6–20)
CO2: 28 mmol/L (ref 22–32)
Calcium: 9.4 mg/dL (ref 8.9–10.3)
Chloride: 93 mmol/L — ABNORMAL LOW (ref 98–111)
Creatinine, Ser: 0.42 mg/dL — ABNORMAL LOW (ref 0.61–1.24)
GFR, Estimated: >60 mL/min
Glucose, Bld: 74 mg/dL (ref 70–99)
Potassium: 3.3 mmol/L — ABNORMAL LOW (ref 3.5–5.1)
Sodium: 136 mmol/L (ref 135–145)
Total Bilirubin: 0.4 mg/dL (ref 0.0–1.2)
Total Protein: 6.7 g/dL (ref 6.5–8.1)

## 2024-11-01 LAB — MAGNESIUM: Magnesium: 1.5 mg/dL — ABNORMAL LOW (ref 1.7–2.4)

## 2024-11-01 MED ORDER — POTASSIUM CHLORIDE CRYS ER 20 MEQ PO TBCR
40.0000 meq | EXTENDED_RELEASE_TABLET | Freq: Two times a day (BID) | ORAL | Status: AC
  Administered 2024-11-01 (×2): 40 meq via ORAL
  Filled 2024-11-01 (×2): qty 2

## 2024-11-01 MED ORDER — OXYCODONE HCL 5 MG PO TABS
5.0000 mg | ORAL_TABLET | Freq: Four times a day (QID) | ORAL | Status: DC | PRN
  Administered 2024-11-01 – 2024-11-07 (×13): 10 mg via ORAL
  Administered 2024-11-07 (×2): 5 mg via ORAL
  Administered 2024-11-08 (×2): 10 mg via ORAL
  Filled 2024-11-01 (×13): qty 2

## 2024-11-01 MED ORDER — POLYETHYLENE GLYCOL 3350 17 G PO PACK
17.0000 g | PACK | Freq: Two times a day (BID) | ORAL | Status: DC
  Administered 2024-11-01 – 2024-11-07 (×9): 17 g via ORAL
  Filled 2024-11-01 (×8): qty 1

## 2024-11-01 MED ORDER — SENNOSIDES-DOCUSATE SODIUM 8.6-50 MG PO TABS
1.0000 | ORAL_TABLET | Freq: Two times a day (BID) | ORAL | Status: DC
  Administered 2024-11-01 – 2024-11-07 (×11): 1 via ORAL
  Filled 2024-11-01 (×11): qty 1

## 2024-11-01 MED ORDER — PANCRELIPASE (LIP-PROT-AMYL) 36000-114000 UNITS PO CPEP
36000.0000 [IU] | ORAL_CAPSULE | Freq: Three times a day (TID) | ORAL | Status: DC
  Administered 2024-11-01 – 2024-11-08 (×22): 36000 [IU] via ORAL
  Filled 2024-11-01 (×18): qty 1

## 2024-11-01 MED ORDER — MAGNESIUM SULFATE 4 GM/100ML IV SOLN
4.0000 g | Freq: Once | INTRAVENOUS | Status: AC
  Administered 2024-11-01: 4 g via INTRAVENOUS
  Filled 2024-11-01: qty 100

## 2024-11-01 NOTE — Progress Notes (Addendum)
 "          PROGRESS NOTE  Gary [name] FMW:[id number] DOB: Dec 01, 1985 DOA: 10/29/2024 PCP: Pcp, No  Brief History:  38yo with h/o alcohol  abuse, chronic pancreatitis with frequent hospitalizations for acute on chronic pancreatitis, HIV/AIDS, medication noncompliance, seizure disorder, bipolar schizophrenia, PTSD and homelessness who has had frequent recent ER visits who presented on 12/21 with abdominal pain. Continues with active drinking. CT with focal pancreatitis of the head and uncinate process as well as patchy infiltrate in the R lung. He was given Zosyn , IV, and Dilaudid .    Today, continues to complain of abdominal pain, still with intermittent fever spike, last was on 12/23 about 100.8.   Assessment/Plan:  Acute alcoholic pancreatitis Acute on chronic pancreatitis in the setting of continued alcohol  abuse CT abdomen pelvis showing acute pancreatitis of uncinate process and pancreatic head lipase 282 Starting clear liquid diet.  Advance diet as patient tolerates. Continue Zofran  as needed Restart Creon , pain management 43 ER visits and 7 admissions in the last 6 months Needs complete abstinence/cessation of alcohol  given his recurrent pancreatitis events, as this is the etiology to his multiple hospitalizations and ED visits  Aspiration pneumonia SIRS Patient meets SIRS criteria with fever to 102.7, tachycardia, tachypnea Chest x-ray showing evidence for aspiration pneumonia In the ED patient received IV Zosyn  -> Unasyn   Alcohol  withdrawal Patient with continued alcohol  abuse, last drink day of admission, shortly after last dc Monitor on CIWA protocol  Counsel on complete alcohol  cessation/abstinence He has had severe withdrawal on prior hospitalizations Transfer to SDU Continue phenobarbital  taper per pharmacy Psychiatry consulted PCCM consulted; if he transitions to IV Phenobarb, they will assume care  Gram positive bacteremia 12/22 blood culture = staph species  on BCID; also has GPR Continue vanc, Unasyn  Follow blood culture  Hypokalemia/hypomagnesemia Replace as needed  HIV Not currently taking Biktarvy , Bactrim  Outpatient follow-up with infectious disease CD 4 161/36%  Bipolar disorder (HCC) PTSD (post-traumatic stress disorder) GAD (generalized anxiety disorder) Previously on Seroquel , not currently taking medication Psychiatry consulted, recommends increasing gabapentin  dose He does not currently meet IVC crtieria  Thrombocytopenia Likely associated with bone marrow suppression in the setting of alcohol  use  History of seizure Associated with ETOH withdrawal, last admitted for this issue from 10/29-11/3 Has not been taking Keppra  at home Continue IV Keppra  500 mg twice daily Continue Ativan  2 mg every 6 as needed for seizure Seizure precautions ordered  Tobacco use disorder Cessation encouraged cessation Nicotine  patch ordered         Consultants: Psychiatry PCCM TOC team   Procedures: None   Antibiotics: Unasyn  12/22- Vancomycin          Family Communication:  no Family at bedside   Code Status:  FULL   DVT Prophylaxis:   Vance Lovenox    Procedures: As Listed in Progress Note Above  Antibiotics: Vanc 12.22>> Unasyn  12/22>>       Objective: Vitals:   11/01/24 1602 11/01/24 1700 11/01/24 1800 11/01/24 1900  BP: 119/79 130/88 (!) 140/95 115/72  Pulse: 85 89 91 84  Resp: 16 (!) 23 17 14   Temp: 99.8 F (37.7 C)     TempSrc: Oral     SpO2: 93% 93% 94% 91%  Weight:      Height:        Intake/Output Summary (Last 24 hours) at 11/01/2024 2005 Last data filed at 11/01/2024 1900 Gross per 24 hour  Intake 3478.62 ml  Output 1965 ml  Net 1513.62 ml  Weight change:  Exam: General: NAD  Cardiovascular: S1, S2 present Respiratory: CTAB Abdomen: Soft, tender, nondistended, bowel sounds present Musculoskeletal: No bilateral pedal edema noted Skin: Normal Psychiatry: Unable to  asses    Data Reviewed: I have personally reviewed following labs and imaging studies Basic Metabolic Panel: Recent Labs  Lab 10/29/24 2043 10/30/24 0614 11/01/24 0318  NA 141 137 136  K 4.0 3.8 3.3*  CL 98 98 93*  CO2 20* 19* 28  GLUCOSE 115* 92 74  BUN 8 <5* <5*  CREATININE 0.61 0.59* 0.42*  CALCIUM  9.3 8.6* 9.4  MG  --   --  1.5*   Liver Function Tests: Recent Labs  Lab 10/29/24 2043 10/30/24 0614 11/01/24 0318  AST 48* 34 38  ALT 44 29 30  ALKPHOS 138* 109 106  BILITOT 0.4 0.5 0.4  PROT 8.1 6.5 6.7  ALBUMIN 4.4 3.5 3.4*   Recent Labs  Lab 10/29/24 2043  LIPASE 282*   No results for input(s): AMMONIA in the last 168 hours. Coagulation Profile: No results for input(s): INR, PROTIME in the last 168 hours. CBC: Recent Labs  Lab 10/29/24 2043 10/30/24 0801 11/01/24 0318  WBC 12.2* 8.1 6.1  NEUTROABS  --   --  4.6  HGB 13.8 10.4* 11.1*  HCT 42.0 31.6* 33.6*  MCV 93.8 92.7 92.3  PLT 136* 84* 85*   Cardiac Enzymes: No results for input(s): CKTOTAL, CKMB, CKMBINDEX, TROPONINI in the last 168 hours. BNP: Invalid input(s): POCBNP CBG: No results for input(s): GLUCAP in the last 168 hours. HbA1C: No results for input(s): HGBA1C in the last 72 hours. Urine analysis:    Component Value Date/Time   COLORURINE YELLOW 10/30/2024 0149   APPEARANCEUR CLEAR 10/30/2024 0149   APPEARANCEUR Clear 11/15/2014 1755   LABSPEC 1.037 (H) 10/30/2024 0149   LABSPEC 1.002 11/15/2014 1755   PHURINE 6.0 10/30/2024 0149   GLUCOSEU NEGATIVE 10/30/2024 0149   GLUCOSEU Negative 11/15/2014 1755   HGBUR SMALL (A) 10/30/2024 0149   BILIRUBINUR NEGATIVE 10/30/2024 0149   BILIRUBINUR Negative 11/15/2014 1755   KETONESUR 80 (A) 10/30/2024 0149   PROTEINUR >=300 (A) 10/30/2024 0149   NITRITE NEGATIVE 10/30/2024 0149   LEUKOCYTESUR NEGATIVE 10/30/2024 0149   LEUKOCYTESUR Negative 11/15/2014 1755   Sepsis  Labs: @LABRCNTIP (procalcitonin:4,lacticidven:4) ) Recent Results (from the past 240 hours)  Resp panel by RT-PCR (RSV, Flu A&B, Covid) Anterior Nasal Swab     Status: None   Collection Time: 10/29/24  8:43 PM   Specimen: Anterior Nasal Swab  Result Value Ref Range Status   SARS Coronavirus 2 by RT PCR NEGATIVE NEGATIVE Final    Comment: (NOTE) SARS-CoV-2 target nucleic acids are NOT DETECTED.  The SARS-CoV-2 RNA is generally detectable in upper respiratory specimens during the acute phase of infection. The lowest concentration of SARS-CoV-2 viral copies this assay can detect is 138 copies/mL. A negative result does not preclude SARS-Cov-2 infection and should not be used as the sole basis for treatment or other patient management decisions. A negative result may occur with  improper specimen collection/handling, submission of specimen other than nasopharyngeal swab, presence of viral mutation(s) within the areas targeted by this assay, and inadequate number of viral copies(<138 copies/mL). A negative result must be combined with clinical observations, patient history, and epidemiological information. The expected result is Negative.  Fact Sheet for Patients:  bloggercourse.com  Fact Sheet for Healthcare Providers:  seriousbroker.it  This test is no t yet approved or cleared by the [place]  FDA  and  has been authorized for detection and/or diagnosis of SARS-CoV-2 by FDA under an Emergency Use Authorization (EUA). This EUA will remain  in effect (meaning this test can be used) for the duration of the COVID-19 declaration under Section 564(b)(1) of the Act, 21 U.S.C.section 360bbb-3(b)(1), unless the authorization is terminated  or revoked sooner.       Influenza A by PCR NEGATIVE NEGATIVE Final   Influenza B by PCR NEGATIVE NEGATIVE Final    Comment: (NOTE) The Xpert Xpress SARS-CoV-2/FLU/RSV plus assay is intended as an  aid in the diagnosis of influenza from Nasopharyngeal swab specimens and should not be used as a sole basis for treatment. Nasal washings and aspirates are unacceptable for Xpert Xpress SARS-CoV-2/FLU/RSV testing.  Fact Sheet for Patients: bloggercourse.com  Fact Sheet for Healthcare Providers: seriousbroker.it  This test is not yet approved or cleared by the [place]  FDA and has been authorized for detection and/or diagnosis of SARS-CoV-2 by FDA under an Emergency Use Authorization (EUA). This EUA will remain in effect (meaning this test can be used) for the duration of the COVID-19 declaration under Section 564(b)(1) of the Act, 21 U.S.C. section 360bbb-3(b)(1), unless the authorization is terminated or revoked.     Resp Syncytial Virus by PCR NEGATIVE NEGATIVE Final    Comment: (NOTE) Fact Sheet for Patients: bloggercourse.com  Fact Sheet for Healthcare Providers: seriousbroker.it  This test is not yet approved or cleared by the [place]  FDA and has been authorized for detection and/or diagnosis of SARS-CoV-2 by FDA under an Emergency Use Authorization (EUA). This EUA will remain in effect (meaning this test can be used) for the duration of the COVID-19 declaration under Section 564(b)(1) of the Act, 21 U.S.C. section 360bbb-3(b)(1), unless the authorization is terminated or revoked.  Performed at [facility], [address] [place], [place] [address]   Blood culture (routine x 2)     Status: None (Preliminary result)   Collection Time: 10/30/24  1:49 AM   Specimen: BLOOD  Result Value Ref Range Status   Specimen Description   Final    BLOOD RIGHT ANTECUBITAL Performed at [facility], [address], [place], [place] [address]    Special Requests   Final    BOTTLES DRAWN AEROBIC AND ANAEROBIC Blood Culture results may not be  optimal due to an inadequate volume of blood received in culture bottles Performed at [facility], [address], [place], [place] [address]    Culture  Setup Time   Final    GRAM POSITIVE COCCI ANAEROBIC BOTTLE ONLY CRITICAL RESULT CALLED TO, READ BACK BY AND VERIFIED WITH:   MITCHELL LILLISTON PHARMD 10/31/2024 @ 0429 BY DD GRAM POSITIVE RODS AEROBIC BOTTLE ONLY RBV CHRISTINE SHADE PHARM D 10/31/2024 @0738  BY DD Performed at [facility], [address], [place], [place] [address]    Culture GRAM POSITIVE COCCI GRAM POSITIVE RODS   Final   Report Status PENDING  Incomplete  Blood culture (routine x 2)     Status: None (Preliminary result)   Collection Time: 10/30/24  1:49 AM   Specimen: BLOOD LEFT HAND  Result Value Ref Range Status   Specimen Description   Final    BLOOD LEFT HAND Performed at [facility], [address] [place], [place] [address]    Special Requests   Final    BOTTLES DRAWN AEROBIC AND ANAEROBIC Blood Culture results may not be optimal due to an inadequate  volume of blood received in culture bottles Performed at [facility], [address] [place], [place] [address]    Culture  Setup Time   Final    GRAM POSITIVE COCCI ANAEROBIC BOTTLE ONLY CRITICAL VALUE NOTED.  VALUE IS CONSISTENT WITH PREVIOUSLY REPORTED AND CALLED VALUE. GRAM POSITIVE RODS AEROBIC BOTTLE ONLY CRITICAL RESULT CALLED TO, READ BACK BY AND VERIFIED WITH: PHARMD [email or web address] AT 0630 ON 10/31/2024 BY T.SAAD. Performed at [facility], [address] [place], [place] [address]    Culture GRAM POSITIVE COCCI GRAM POSITIVE RODS   Final   Report Status PENDING  Incomplete  Blood Culture ID Panel (Reflexed)     Status: None   Collection Time: 10/30/24  1:49 AM  Result Value Ref Range Status   Enterococcus faecalis NOT DETECTED NOT DETECTED Final   Enterococcus Faecium NOT DETECTED NOT DETECTED Final   Listeria monocytogenes NOT  DETECTED NOT DETECTED Final   Staphylococcus species NOT DETECTED NOT DETECTED Final   Staphylococcus aureus (BCID) NOT DETECTED NOT DETECTED Final   Staphylococcus epidermidis NOT DETECTED NOT DETECTED Final   Staphylococcus lugdunensis NOT DETECTED NOT DETECTED Final   Streptococcus species NOT DETECTED NOT DETECTED Final   Streptococcus agalactiae NOT DETECTED NOT DETECTED Final   Streptococcus pneumoniae NOT DETECTED NOT DETECTED Final   Streptococcus pyogenes NOT DETECTED NOT DETECTED Final   A.calcoaceticus-baumannii NOT DETECTED NOT DETECTED Final   Bacteroides fragilis NOT DETECTED NOT DETECTED Final   Enterobacterales NOT DETECTED NOT DETECTED Final   Enterobacter cloacae complex NOT DETECTED NOT DETECTED Final   Escherichia coli NOT DETECTED NOT DETECTED Final   Klebsiella aerogenes NOT DETECTED NOT DETECTED Final   Klebsiella oxytoca NOT DETECTED NOT DETECTED Final   Klebsiella pneumoniae NOT DETECTED NOT DETECTED Final   Proteus species NOT DETECTED NOT DETECTED Final   Salmonella species NOT DETECTED NOT DETECTED Final   Serratia marcescens NOT DETECTED NOT DETECTED Final   Haemophilus influenzae NOT DETECTED NOT DETECTED Final   Neisseria meningitidis NOT DETECTED NOT DETECTED Final   Pseudomonas aeruginosa NOT DETECTED NOT DETECTED Final   Stenotrophomonas maltophilia NOT DETECTED NOT DETECTED Final   Candida albicans NOT DETECTED NOT DETECTED Final   Candida auris NOT DETECTED NOT DETECTED Final   Candida glabrata NOT DETECTED NOT DETECTED Final   Candida krusei NOT DETECTED NOT DETECTED Final   Candida parapsilosis NOT DETECTED NOT DETECTED Final   Candida tropicalis NOT DETECTED NOT DETECTED Final   Cryptococcus neoformans/gattii NOT DETECTED NOT DETECTED Final    Comment: Performed at [facility], [address] [place], [place] [address]  MRSA Next Gen by PCR, Nasal     Status: None   Collection Time: 10/30/24  2:07 PM   Specimen: Nasal Mucosa; Nasal Swab   Result Value Ref Range Status   MRSA by PCR Next Gen NOT DETECTED NOT DETECTED Final    Comment: (NOTE) The GeneXpert MRSA Assay (FDA approved for NASAL specimens only), is one component of a comprehensive MRSA colonization surveillance program. It is not intended to diagnose MRSA infection nor to guide or monitor treatment for MRSA infections. Test performance is not FDA approved in patients less than 36 years old. Performed at [facility], [address], [place], [place] [address]      Scheduled Meds:  Chlorhexidine  Gluconate Cloth  6 each Topical Daily   enoxaparin  (LOVENOX ) injection  40 mg Subcutaneous Q24H   folic acid   1 mg Oral Daily  levETIRAcetam   500 mg Intravenous Q12H   lipase/protease/amylase  36,000 Units Oral TID AC   multivitamin with minerals  1 tablet Oral Daily   phenobarbital   64.8 mg Oral Q8H   Followed by   NOREEN ON 11/03/2024] phenobarbital   32.4 mg Oral Q8H   polyethylene glycol  17 g Oral BID   potassium chloride   40 mEq Oral BID   senna-docusate  1 tablet Oral BID   sodium chloride  flush  3 mL Intravenous Q12H   sodium chloride  flush  3 mL Intravenous Q12H   thiamine  (VITAMIN B1) injection  100 mg Intravenous Daily   Continuous Infusions:  ampicillin -sulbactam (UNASYN ) IV 3 g (11/01/24 1956)   lactated ringers  125 mL/hr at 11/01/24 1500   vancomycin  Stopped (11/01/24 1917)    Procedures/Studies: CT CHEST ABDOMEN PELVIS W CONTRAST Result Date: 10/30/2024 CLINICAL DATA:  Abdominal pain for 1 day, history of pancreatitis EXAM: CT CHEST, ABDOMEN, AND PELVIS WITH CONTRAST TECHNIQUE: Multidetector CT imaging of the chest, abdomen and pelvis was performed following the standard protocol during bolus administration of intravenous contrast. RADIATION DOSE REDUCTION: This exam was performed according to the departmental dose-optimization program which includes automated exposure control, adjustment of the mA and/or kV according to  patient size and/or use of iterative reconstruction technique. CONTRAST:  OMNIPAQUE  IOHEXOL  300 MG/ML  SOLN COMPARISON:  06/16/2024 FINDINGS: CT CHEST FINDINGS Cardiovascular: Mild atherosclerotic calcifications of the aorta are noted. No aneurysmal dilatation is seen. Heart is not significantly enlarged in size. Minimal coronary calcifications are noted. Pulmonary artery as visualized is within normal limits although not timed for embolus evaluation. Mediastinum/Nodes: Thoracic inlet is within normal limits. No hilar or mediastinal adenopathy is noted. The esophagus is unremarkable. Lungs/Pleura: The lungs are well aerated bilaterally. Focal consolidation is noted in right lower lobe centrally. Patchy infiltrate is also noted in the inferior aspect of the right middle lobe. These changes are consistent with multifocal infiltrate. Musculoskeletal: Degenerative changes of the thoracic spine are noted. CT ABDOMEN PELVIS FINDINGS Hepatobiliary: Fatty infiltration of the liver is noted. The gallbladder is unremarkable. Pancreas: Pancreas is well visualized without evidence of pancreatic necrosis the tail appears atrophic. Some peripancreatic inflammatory changes and fluid are noted surrounding the head and uncinate process consistent with focal pancreatitis. Spleen: Normal in size without focal abnormality. Adrenals/Urinary Tract: Adrenal glands are within normal limits. Kidneys demonstrate a normal enhancement pattern. Small 1 cm cyst is noted in the lower pole of the right kidney. This appears simple in nature and no further follow-up is recommended. No obstructive changes are seen. The bladder is over distended. Stomach/Bowel: Stomach is within normal limits. Appendix appears normal. No evidence of bowel wall thickening, distention, or inflammatory changes. Small bowel and stomach are unremarkable. Vascular/Lymphatic: No significant vascular findings are present. No enlarged abdominal or pelvic lymph nodes.  Reproductive: Prostate is unremarkable. Other: No abdominal wall hernia or abnormality. No abdominopelvic ascites. Musculoskeletal: No acute or significant osseous findings. IMPRESSION: CT of the chest: Patchy multifocal infiltrate in the right lung. CT of the abdomen and pelvis: Changes consistent with focal pancreatitis in the head and uncinate process. Electronically Signed   By: Oneil Devonshire M.D.   On: 10/30/2024 00:23   DG Chest Port 1 View Result Date: 10/12/2024 CLINICAL DATA:  Shortness of breath EXAM: PORTABLE CHEST 1 VIEW COMPARISON:  Chest x-ray 08/15/2024 FINDINGS: The heart size and mediastinal contours are within normal limits. Both lungs are clear. There is mild elevation of the left hemidiaphragm. The visualized  skeletal structures are unremarkable. IMPRESSION: No active disease. Electronically Signed   By: Greig Pique M.D.   On: 10/12/2024 21:34    Lebron JINNY Cage, MD  [facility]  If 7PM-7AM, please contact night-coverage [email or web address] 11/01/2024, 8:05 PM   LOS: 2 days   "

## 2024-11-02 DIAGNOSIS — K852 Alcohol induced acute pancreatitis without necrosis or infection: Secondary | ICD-10-CM | POA: Diagnosis not present

## 2024-11-02 LAB — CBC
HCT: 31.4 % — ABNORMAL LOW (ref 39.0–52.0)
Hemoglobin: 10.4 g/dL — ABNORMAL LOW (ref 13.0–17.0)
MCH: 30.2 pg (ref 26.0–34.0)
MCHC: 33.1 g/dL (ref 30.0–36.0)
MCV: 91.3 fL (ref 80.0–100.0)
Platelets: 90 K/uL — ABNORMAL LOW (ref 150–400)
RBC: 3.44 MIL/uL — ABNORMAL LOW (ref 4.22–5.81)
RDW: 15.9 % — ABNORMAL HIGH (ref 11.5–15.5)
WBC: 4.4 K/uL (ref 4.0–10.5)
nRBC: 0 % (ref 0.0–0.2)

## 2024-11-02 LAB — BASIC METABOLIC PANEL WITH GFR
Anion gap: 12 (ref 5–15)
BUN: <5 mg/dL — ABNORMAL LOW (ref 6–20)
CO2: 27 mmol/L (ref 22–32)
Calcium: 8.7 mg/dL — ABNORMAL LOW (ref 8.9–10.3)
Chloride: 94 mmol/L — ABNORMAL LOW (ref 98–111)
Creatinine, Ser: 0.31 mg/dL — ABNORMAL LOW (ref 0.61–1.24)
GFR, Estimated: >60 mL/min
Glucose, Bld: 96 mg/dL (ref 70–99)
Potassium: 3.6 mmol/L (ref 3.5–5.1)
Sodium: 133 mmol/L — ABNORMAL LOW (ref 135–145)

## 2024-11-02 LAB — MAGNESIUM: Magnesium: 1.8 mg/dL (ref 1.7–2.4)

## 2024-11-02 NOTE — Plan of Care (Signed)
   Problem: Activity: Goal: Risk for activity intolerance will decrease Outcome: Progressing   Problem: Nutrition: Goal: Adequate nutrition will be maintained Outcome: Progressing   Problem: Safety: Goal: Ability to remain free from injury will improve Outcome: Progressing   Problem: Skin Integrity: Goal: Risk for impaired skin integrity will decrease Outcome: Progressing

## 2024-11-02 NOTE — Progress Notes (Signed)
 "          PROGRESS NOTE  Gary [name] FMW:[id number] DOB: 1986/06/24 DOA: 10/29/2024 PCP: Pcp, No  Brief History:  38yo with h/o alcohol  abuse, chronic pancreatitis with frequent hospitalizations for acute on chronic pancreatitis, HIV/AIDS, medication noncompliance, seizure disorder, bipolar schizophrenia, PTSD and homelessness who has had frequent recent ER visits who presented on 12/21 with abdominal pain. Continues with active drinking. CT with focal pancreatitis of the head and uncinate process as well as patchy infiltrate in the R lung. He was given Zosyn , IV, and Dilaudid .    Today, continues to ask for pain meds, c/o persistent abd pain, but appears to be drowsy.   Assessment/Plan:  Acute alcoholic pancreatitis Acute on chronic pancreatitis in the setting of continued alcohol  abuse CT abdomen pelvis showing acute pancreatitis of uncinate process and pancreatic head lipase 282 Starting clear liquid diet.  Advance diet as patient tolerates. Continue Zofran  as needed Restart Creon , pain management 43 ER visits and 7 admissions in the last 6 months Needs complete abstinence/cessation of alcohol  given his recurrent pancreatitis events, as this is the etiology to his multiple hospitalizations and ED visits  Aspiration pneumonia SIRS Patient meets SIRS criteria with fever to 102.7, tachycardia, tachypnea Chest x-ray showing evidence for aspiration pneumonia In the ED patient received IV Zosyn  -> Unasyn   Alcohol  withdrawal Patient with continued alcohol  abuse, last drink day of admission, shortly after last dc Monitor on CIWA protocol  Counsel on complete alcohol  cessation/abstinence He has had severe withdrawal on prior hospitalizations Transfer to SDU Continue phenobarbital  taper per pharmacy Psychiatry consulted PCCM consulted; if he transitions to IV Phenobarb, they will assume care  Gram positive bacteremia 12/22 blood culture = staph species on BCID; also has  GPR Continue vanc, Unasyn  Follow blood culture  Hypokalemia/hypomagnesemia Replace as needed  HIV Not currently taking Biktarvy , Bactrim  Outpatient follow-up with infectious disease CD 4 161/36%  Bipolar disorder (HCC) PTSD (post-traumatic stress disorder) GAD (generalized anxiety disorder) Previously on Seroquel , not currently taking medication Psychiatry consulted, recommends increasing gabapentin  dose He does not currently meet IVC crtieria  Thrombocytopenia Likely associated with bone marrow suppression in the setting of alcohol  use  History of seizure Associated with ETOH withdrawal, last admitted for this issue from 10/29-11/3 Has not been taking Keppra  at home Continue IV Keppra  500 mg twice daily Continue Ativan  2 mg every 6 as needed for seizure Seizure precautions ordered  Tobacco use disorder Cessation encouraged cessation Nicotine  patch ordered         Consultants: Psychiatry PCCM TOC team   Procedures: None   Antibiotics: Unasyn  12/22- Vancomycin          Family Communication:  no Family at bedside   Code Status:  FULL   DVT Prophylaxis:   [place] Lovenox    Procedures: As Listed in Progress Note Above  Antibiotics: Vanc 12.22>> Unasyn  12/22>>       Objective: Vitals:   11/02/24 0900 11/02/24 1000 11/02/24 1155 11/02/24 1319  BP: (!) 138/95 (!) 122/92  (!) 128/94  Pulse: (!) 102 97  80  Resp: (!) 22 13  16   Temp:   98.7 F (37.1 C) 98.8 F (37.1 C)  TempSrc:   Oral Oral  SpO2: 93% 93%  96%  Weight:      Height:        Intake/Output Summary (Last 24 hours) at 11/02/2024 1642 Last data filed at 11/02/2024 0900 Gross per 24 hour  Intake 2711.12 ml  Output 1375 ml  Net 1336.12 ml   Weight change:  Exam: General: NAD  Cardiovascular: S1, S2 present Respiratory: CTAB Abdomen: Soft, tender, nondistended, bowel sounds present Musculoskeletal: No bilateral pedal edema noted Skin: Normal Psychiatry: Unable to  asses    Data Reviewed: I have personally reviewed following labs and imaging studies Basic Metabolic Panel: Recent Labs  Lab 10/29/24 2043 10/30/24 0614 11/01/24 0318 11/02/24 0313  NA 141 137 136 133*  K 4.0 3.8 3.3* 3.6  CL 98 98 93* 94*  CO2 20* 19* 28 27  GLUCOSE 115* 92 74 96  BUN 8 <5* <5* <5*  CREATININE 0.61 0.59* 0.42* 0.31*  CALCIUM  9.3 8.6* 9.4 8.7*  MG  --   --  1.5* 1.8   Liver Function Tests: Recent Labs  Lab 10/29/24 2043 10/30/24 0614 11/01/24 0318  AST 48* 34 38  ALT 44 29 30  ALKPHOS 138* 109 106  BILITOT 0.4 0.5 0.4  PROT 8.1 6.5 6.7  ALBUMIN 4.4 3.5 3.4*   Recent Labs  Lab 10/29/24 2043  LIPASE 282*   No results for input(s): AMMONIA in the last 168 hours. Coagulation Profile: No results for input(s): INR, PROTIME in the last 168 hours. CBC: Recent Labs  Lab 10/29/24 2043 10/30/24 0801 11/01/24 0318 11/02/24 0313  WBC 12.2* 8.1 6.1 4.4  NEUTROABS  --   --  4.6  --   HGB 13.8 10.4* 11.1* 10.4*  HCT 42.0 31.6* 33.6* 31.4*  MCV 93.8 92.7 92.3 91.3  PLT 136* 84* 85* 90*   Cardiac Enzymes: No results for input(s): CKTOTAL, CKMB, CKMBINDEX, TROPONINI in the last 168 hours. BNP: Invalid input(s): POCBNP CBG: No results for input(s): GLUCAP in the last 168 hours. HbA1C: No results for input(s): HGBA1C in the last 72 hours. Urine analysis:    Component Value Date/Time   COLORURINE YELLOW 10/30/2024 0149   APPEARANCEUR CLEAR 10/30/2024 0149   APPEARANCEUR Clear 11/15/2014 1755   LABSPEC 1.037 (H) 10/30/2024 0149   LABSPEC 1.002 11/15/2014 1755   PHURINE 6.0 10/30/2024 0149   GLUCOSEU NEGATIVE 10/30/2024 0149   GLUCOSEU Negative 11/15/2014 1755   HGBUR SMALL (A) 10/30/2024 0149   BILIRUBINUR NEGATIVE 10/30/2024 0149   BILIRUBINUR Negative 11/15/2014 1755   KETONESUR 80 (A) 10/30/2024 0149   PROTEINUR >=300 (A) 10/30/2024 0149   NITRITE NEGATIVE 10/30/2024 0149   LEUKOCYTESUR NEGATIVE 10/30/2024 0149    LEUKOCYTESUR Negative 11/15/2014 1755   Sepsis Labs: @LABRCNTIP (procalcitonin:4,lacticidven:4) ) Recent Results (from the past 240 hours)  Resp panel by RT-PCR (RSV, Flu A&B, Covid) Anterior Nasal Swab     Status: None   Collection Time: 10/29/24  8:43 PM   Specimen: Anterior Nasal Swab  Result Value Ref Range Status   SARS Coronavirus 2 by RT PCR NEGATIVE NEGATIVE Final    Comment: (NOTE) SARS-CoV-2 target nucleic acids are NOT DETECTED.  The SARS-CoV-2 RNA is generally detectable in upper respiratory specimens during the acute phase of infection. The lowest concentration of SARS-CoV-2 viral copies this assay can detect is 138 copies/mL. A negative result does not preclude SARS-Cov-2 infection and should not be used as the sole basis for treatment or other patient management decisions. A negative result may occur with  improper specimen collection/handling, submission of specimen other than nasopharyngeal swab, presence of viral mutation(s) within the areas targeted by this assay, and inadequate number of viral copies(<138 copies/mL). A negative result must be combined with clinical observations, patient history, and epidemiological information. The expected result is Negative.  Fact Sheet for  Patients:  bloggercourse.com  Fact Sheet for Healthcare Providers:  seriousbroker.it  This test is no t yet approved or cleared by the [place]  FDA and  has been authorized for detection and/or diagnosis of SARS-CoV-2 by FDA under an Emergency Use Authorization (EUA). This EUA will remain  in effect (meaning this test can be used) for the duration of the COVID-19 declaration under Section 564(b)(1) of the Act, 21 U.S.C.section 360bbb-3(b)(1), unless the authorization is terminated  or revoked sooner.       Influenza A by PCR NEGATIVE NEGATIVE Final   Influenza B by PCR NEGATIVE NEGATIVE Final    Comment: (NOTE) The Xpert Xpress  SARS-CoV-2/FLU/RSV plus assay is intended as an aid in the diagnosis of influenza from Nasopharyngeal swab specimens and should not be used as a sole basis for treatment. Nasal washings and aspirates are unacceptable for Xpert Xpress SARS-CoV-2/FLU/RSV testing.  Fact Sheet for Patients: bloggercourse.com  Fact Sheet for Healthcare Providers: seriousbroker.it  This test is not yet approved or cleared by the [place]  FDA and has been authorized for detection and/or diagnosis of SARS-CoV-2 by FDA under an Emergency Use Authorization (EUA). This EUA will remain in effect (meaning this test can be used) for the duration of the COVID-19 declaration under Section 564(b)(1) of the Act, 21 U.S.C. section 360bbb-3(b)(1), unless the authorization is terminated or revoked.     Resp Syncytial Virus by PCR NEGATIVE NEGATIVE Final    Comment: (NOTE) Fact Sheet for Patients: bloggercourse.com  Fact Sheet for Healthcare Providers: seriousbroker.it  This test is not yet approved or cleared by the [place]  FDA and has been authorized for detection and/or diagnosis of SARS-CoV-2 by FDA under an Emergency Use Authorization (EUA). This EUA will remain in effect (meaning this test can be used) for the duration of the COVID-19 declaration under Section 564(b)(1) of the Act, 21 U.S.C. section 360bbb-3(b)(1), unless the authorization is terminated or revoked.  Performed at [facility], [address], [place], [place] [address]   Blood culture (routine x 2)     Status: None (Preliminary result)   Collection Time: 10/30/24  1:49 AM   Specimen: BLOOD  Result Value Ref Range Status   Specimen Description   Final    BLOOD RIGHT ANTECUBITAL Performed at [facility], [address] [place], [place] [address]    Special Requests   Final    BOTTLES DRAWN AEROBIC AND  ANAEROBIC Blood Culture results may not be optimal due to an inadequate volume of blood received in culture bottles Performed at [facility], [address], [place], [place] [address]    Culture  Setup Time   Final    GRAM POSITIVE COCCI ANAEROBIC BOTTLE ONLY CRITICAL RESULT CALLED TO, READ BACK BY AND VERIFIED WITH:   MITCHELL LILLISTON PHARMD 10/31/2024 @ 0429 BY DD GRAM POSITIVE RODS AEROBIC BOTTLE ONLY RBV CHRISTINE SHADE PHARM D 10/31/2024 @0738  BY DD    Culture   Final    GRAM POSITIVE COCCI GRAM POSITIVE RODS IDENTIFICATION TO FOLLOW Performed at [facility], [address] [place], [place] [address]    Report Status PENDING  Incomplete  Blood culture (routine x 2)     Status: None (Preliminary result)   Collection Time: 10/30/24  1:49 AM   Specimen: BLOOD LEFT HAND  Result Value Ref Range Status   Specimen Description   Final    BLOOD LEFT HAND Performed at [facility], [address] [place], [place] [address]    Special Requests   Final    BOTTLES DRAWN AEROBIC AND ANAEROBIC Blood Culture results may not be optimal due to an inadequate volume of blood received in culture bottles Performed at [facility], [address] [place], [place] [address]    Culture  Setup Time   Final    GRAM POSITIVE COCCI ANAEROBIC BOTTLE ONLY CRITICAL VALUE NOTED.  VALUE IS CONSISTENT WITH PREVIOUSLY REPORTED AND CALLED VALUE. GRAM POSITIVE RODS AEROBIC BOTTLE ONLY CRITICAL RESULT CALLED TO, READ BACK BY AND VERIFIED WITH: PHARMD [email or web address] AT 0630 ON 10/31/2024 BY T.SAAD. Performed at [facility], [address], [place], [place] [address]    Culture GRAM POSITIVE COCCI GRAM POSITIVE RODS   Final   Report Status PENDING  Incomplete  Blood Culture ID Panel (Reflexed)     Status: None   Collection Time: 10/30/24  1:49 AM  Result Value Ref Range Status   Enterococcus faecalis NOT DETECTED NOT DETECTED Final   Enterococcus  Faecium NOT DETECTED NOT DETECTED Final   Listeria monocytogenes NOT DETECTED NOT DETECTED Final   Staphylococcus species NOT DETECTED NOT DETECTED Final   Staphylococcus aureus (BCID) NOT DETECTED NOT DETECTED Final   Staphylococcus epidermidis NOT DETECTED NOT DETECTED Final   Staphylococcus lugdunensis NOT DETECTED NOT DETECTED Final   Streptococcus species NOT DETECTED NOT DETECTED Final   Streptococcus agalactiae NOT DETECTED NOT DETECTED Final   Streptococcus pneumoniae NOT DETECTED NOT DETECTED Final   Streptococcus pyogenes NOT DETECTED NOT DETECTED Final   A.calcoaceticus-baumannii NOT DETECTED NOT DETECTED Final   Bacteroides fragilis NOT DETECTED NOT DETECTED Final   Enterobacterales NOT DETECTED NOT DETECTED Final   Enterobacter cloacae complex NOT DETECTED NOT DETECTED Final   Escherichia coli NOT DETECTED NOT DETECTED Final   Klebsiella aerogenes NOT DETECTED NOT DETECTED Final   Klebsiella oxytoca NOT DETECTED NOT DETECTED Final   Klebsiella pneumoniae NOT DETECTED NOT DETECTED Final   Proteus species NOT DETECTED NOT DETECTED Final   Salmonella species NOT DETECTED NOT DETECTED Final   Serratia marcescens NOT DETECTED NOT DETECTED Final   Haemophilus influenzae NOT DETECTED NOT DETECTED Final   Neisseria meningitidis NOT DETECTED NOT DETECTED Final   Pseudomonas aeruginosa NOT DETECTED NOT DETECTED Final   Stenotrophomonas maltophilia NOT DETECTED NOT DETECTED Final   Candida albicans NOT DETECTED NOT DETECTED Final   Candida auris NOT DETECTED NOT DETECTED Final   Candida glabrata NOT DETECTED NOT DETECTED Final   Candida krusei NOT DETECTED NOT DETECTED Final   Candida parapsilosis NOT DETECTED NOT DETECTED Final   Candida tropicalis NOT DETECTED NOT DETECTED Final   Cryptococcus neoformans/gattii NOT DETECTED NOT DETECTED Final    Comment: Performed at [facility], [address], [place], [place] [address]  MRSA Next Gen by PCR, Nasal     Status: None    Collection Time: 10/30/24  2:07 PM   Specimen: Nasal Mucosa; Nasal Swab  Result Value Ref Range Status   MRSA by PCR Next Gen NOT DETECTED NOT DETECTED Final    Comment: (NOTE) The GeneXpert MRSA Assay (FDA approved for NASAL specimens only), is one component of a comprehensive MRSA colonization surveillance program. It is not intended to diagnose MRSA infection nor to guide or monitor treatment for MRSA infections. Test performance is not FDA approved in patients less than 52 years old. Performed at [facility], [address] [place], [place] [address]  Scheduled Meds:  Chlorhexidine  Gluconate Cloth  6 each Topical Daily   enoxaparin  (LOVENOX ) injection  40 mg Subcutaneous Q24H   folic acid   1 mg Oral Daily   levETIRAcetam   500 mg Intravenous Q12H   lipase/protease/amylase  36,000 Units Oral TID AC   multivitamin with minerals  1 tablet Oral Daily   phenobarbital   64.8 mg Oral Q8H   Followed by   NOREEN ON 11/03/2024] phenobarbital   32.4 mg Oral Q8H   polyethylene glycol  17 g Oral BID   senna-docusate  1 tablet Oral BID   sodium chloride  flush  3 mL Intravenous Q12H   sodium chloride  flush  3 mL Intravenous Q12H   thiamine  (VITAMIN B1) injection  100 mg Intravenous Daily   Continuous Infusions:  ampicillin -sulbactam (UNASYN ) IV 3 g (11/02/24 1345)   lactated ringers  100 mL/hr at 11/02/24 1344   vancomycin  Stopped (11/02/24 0645)    Procedures/Studies: CT CHEST ABDOMEN PELVIS W CONTRAST Result Date: 10/30/2024 CLINICAL DATA:  Abdominal pain for 1 day, history of pancreatitis EXAM: CT CHEST, ABDOMEN, AND PELVIS WITH CONTRAST TECHNIQUE: Multidetector CT imaging of the chest, abdomen and pelvis was performed following the standard protocol during bolus administration of intravenous contrast. RADIATION DOSE REDUCTION: This exam was performed according to the departmental dose-optimization program which includes automated exposure control, adjustment of  the mA and/or kV according to patient size and/or use of iterative reconstruction technique. CONTRAST:  OMNIPAQUE  IOHEXOL  300 MG/ML  SOLN COMPARISON:  06/16/2024 FINDINGS: CT CHEST FINDINGS Cardiovascular: Mild atherosclerotic calcifications of the aorta are noted. No aneurysmal dilatation is seen. Heart is not significantly enlarged in size. Minimal coronary calcifications are noted. Pulmonary artery as visualized is within normal limits although not timed for embolus evaluation. Mediastinum/Nodes: Thoracic inlet is within normal limits. No hilar or mediastinal adenopathy is noted. The esophagus is unremarkable. Lungs/Pleura: The lungs are well aerated bilaterally. Focal consolidation is noted in right lower lobe centrally. Patchy infiltrate is also noted in the inferior aspect of the right middle lobe. These changes are consistent with multifocal infiltrate. Musculoskeletal: Degenerative changes of the thoracic spine are noted. CT ABDOMEN PELVIS FINDINGS Hepatobiliary: Fatty infiltration of the liver is noted. The gallbladder is unremarkable. Pancreas: Pancreas is well visualized without evidence of pancreatic necrosis the tail appears atrophic. Some peripancreatic inflammatory changes and fluid are noted surrounding the head and uncinate process consistent with focal pancreatitis. Spleen: Normal in size without focal abnormality. Adrenals/Urinary Tract: Adrenal glands are within normal limits. Kidneys demonstrate a normal enhancement pattern. Small 1 cm cyst is noted in the lower pole of the right kidney. This appears simple in nature and no further follow-up is recommended. No obstructive changes are seen. The bladder is over distended. Stomach/Bowel: Stomach is within normal limits. Appendix appears normal. No evidence of bowel wall thickening, distention, or inflammatory changes. Small bowel and stomach are unremarkable. Vascular/Lymphatic: No significant vascular findings are present. No enlarged  abdominal or pelvic lymph nodes. Reproductive: Prostate is unremarkable. Other: No abdominal wall hernia or abnormality. No abdominopelvic ascites. Musculoskeletal: No acute or significant osseous findings. IMPRESSION: CT of the chest: Patchy multifocal infiltrate in the right lung. CT of the abdomen and pelvis: Changes consistent with focal pancreatitis in the head and uncinate process. Electronically Signed   By: Oneil Devonshire M.D.   On: 10/30/2024 00:23   DG Chest Port 1 View Result Date: 10/12/2024 CLINICAL DATA:  Shortness of breath EXAM: PORTABLE CHEST 1 VIEW COMPARISON:  Chest x-ray 08/15/2024 FINDINGS: The  heart size and mediastinal contours are within normal limits. Both lungs are clear. There is mild elevation of the left hemidiaphragm. The visualized skeletal structures are unremarkable. IMPRESSION: No active disease. Electronically Signed   By: Greig Pique M.D.   On: 10/12/2024 21:34    Lebron JINNY Cage, MD  [facility]  If 7PM-7AM, please contact night-coverage [email or web address] 11/02/2024, 4:42 PM   LOS: 3 days   "

## 2024-11-03 DIAGNOSIS — K852 Alcohol induced acute pancreatitis without necrosis or infection: Secondary | ICD-10-CM | POA: Diagnosis not present

## 2024-11-03 LAB — CULTURE, BLOOD (ROUTINE X 2)

## 2024-11-03 MED ORDER — DM-GUAIFENESIN ER 30-600 MG PO TB12
1.0000 | ORAL_TABLET | Freq: Two times a day (BID) | ORAL | Status: AC
  Administered 2024-11-03 – 2024-11-08 (×10): 1 via ORAL
  Filled 2024-11-03 (×7): qty 1

## 2024-11-03 NOTE — Progress Notes (Signed)
 "          PROGRESS NOTE  Gary [name] FMW:[id number] DOB: 1986/04/07 DOA: 10/29/2024 PCP: Pcp, No  Brief History:  38yo with h/o alcohol  abuse, chronic pancreatitis with frequent hospitalizations for acute on chronic pancreatitis, HIV/AIDS, medication noncompliance, seizure disorder, bipolar schizophrenia, PTSD and homelessness who has had frequent recent ER visits who presented on 12/21 with abdominal pain. Continues with active drinking. CT with focal pancreatitis of the head and uncinate process as well as patchy infiltrate in the R lung. He was given Zosyn , IV, and Dilaudid .    Today, patient continues to ask for pain meds reporting persistent chronic abdominal pain   Assessment/Plan:  Acute alcoholic pancreatitis Acute on chronic pancreatitis in the setting of continued alcohol  abuse CT abdomen pelvis showing acute pancreatitis of uncinate process and pancreatic head lipase 282 Continue Zofran  as needed Restart Creon , pain management 43 ER visits and 7 admissions in the last 6 months Needs complete abstinence/cessation of alcohol  given his recurrent pancreatitis events, as this is the etiology to his multiple hospitalizations and ED visits  Aspiration pneumonia SIRS Patient meets SIRS criteria with fever to 102.7, tachycardia, tachypnea Chest x-ray showing evidence for aspiration pneumonia In the ED patient received IV Zosyn  -> Unasyn   Alcohol  withdrawal Patient with continued alcohol  abuse, last drink day of admission, shortly after last dc Monitor on CIWA protocol  Counsel on complete alcohol  cessation/abstinence He has had severe withdrawal on prior hospitalizations Transfer to SDU Continue phenobarbital  taper per pharmacy Psychiatry consulted PCCM consulted; if he transitions to IV Phenobarb, they will assume care  Gram positive bacteremia 12/22 blood culture = staph species on BCID; also has GPR, growing alloiococcus otitis, cornyebacterium pseudodiptheriae.  Corynebacterium possibly contaminant  ID notified/consulted Continue vanc, Unasyn  Follow blood culture  Hypokalemia/hypomagnesemia Replace as needed  HIV Not currently taking Biktarvy , Bactrim  Outpatient follow-up with infectious disease CD 4 161/36%  Bipolar disorder (HCC) PTSD (post-traumatic stress disorder) GAD (generalized anxiety disorder) Previously on Seroquel , not currently taking medication Psychiatry consulted, recommends increasing gabapentin  dose He does not currently meet IVC crtieria  Thrombocytopenia Likely associated with bone marrow suppression in the setting of alcohol  use  History of seizure Associated with ETOH withdrawal, last admitted for this issue from 10/29-11/3 Has not been taking Keppra  at home Continue IV Keppra  500 mg twice daily Continue Ativan  2 mg every 6 as needed for seizure Seizure precautions ordered  Tobacco use disorder Cessation encouraged cessation Nicotine  patch ordered         Consultants: Psychiatry PCCM   Procedures: None   Antibiotics: Unasyn  12/22- Vancomycin          Family Communication:  no Family at bedside   Code Status:  FULL   DVT Prophylaxis:   [facility] Lovenox    Procedures: As Listed in Progress Note Above  Antibiotics: Vanc 12.22>> Unasyn  12/22>>       Objective: Vitals:   11/03/24 0052 11/03/24 0506 11/03/24 0626 11/03/24 1313  BP: (!) 129/90 119/86 113/82 118/87  Pulse: 96 90 85 84  Resp: 17 16 16 16   Temp: 99.3 F (37.4 C) 99.4 F (37.4 C) 98.9 F (37.2 C) 98.4 F (36.9 C)  TempSrc: Oral Oral Oral Oral  SpO2: 94% 91% 93% 93%  Weight:      Height:        Intake/Output Summary (Last 24 hours) at 11/03/2024 1802 Last data filed at 11/03/2024 1324 Gross per 24 hour  Intake 650 ml  Output 1650 ml  Net -1000  ml   Weight change:  Exam: General: NAD  Cardiovascular: S1, S2 present Respiratory: CTAB Abdomen: Soft, tender, nondistended, bowel sounds  present Musculoskeletal: No bilateral pedal edema noted Skin: Normal Psychiatry: Unable to asses    Data Reviewed: I have personally reviewed following labs and imaging studies Basic Metabolic Panel: Recent Labs  Lab 10/29/24 2043 10/30/24 0614 11/01/24 0318 11/02/24 0313  NA 141 137 136 133*  K 4.0 3.8 3.3* 3.6  CL 98 98 93* 94*  CO2 20* 19* 28 27  GLUCOSE 115* 92 74 96  BUN 8 <5* <5* <5*  CREATININE 0.61 0.59* 0.42* 0.31*  CALCIUM  9.3 8.6* 9.4 8.7*  MG  --   --  1.5* 1.8   Liver Function Tests: Recent Labs  Lab 10/29/24 2043 10/30/24 0614 11/01/24 0318  AST 48* 34 38  ALT 44 29 30  ALKPHOS 138* 109 106  BILITOT 0.4 0.5 0.4  PROT 8.1 6.5 6.7  ALBUMIN 4.4 3.5 3.4*   Recent Labs  Lab 10/29/24 2043  LIPASE 282*   No results for input(s): AMMONIA in the last 168 hours. Coagulation Profile: No results for input(s): INR, PROTIME in the last 168 hours. CBC: Recent Labs  Lab 10/29/24 2043 10/30/24 0801 11/01/24 0318 11/02/24 0313  WBC 12.2* 8.1 6.1 4.4  NEUTROABS  --   --  4.6  --   HGB 13.8 10.4* 11.1* 10.4*  HCT 42.0 31.6* 33.6* 31.4*  MCV 93.8 92.7 92.3 91.3  PLT 136* 84* 85* 90*   Cardiac Enzymes: No results for input(s): CKTOTAL, CKMB, CKMBINDEX, TROPONINI in the last 168 hours. BNP: Invalid input(s): POCBNP CBG: No results for input(s): GLUCAP in the last 168 hours. HbA1C: No results for input(s): HGBA1C in the last 72 hours. Urine analysis:    Component Value Date/Time   COLORURINE YELLOW 10/30/2024 0149   APPEARANCEUR CLEAR 10/30/2024 0149   APPEARANCEUR Clear 11/15/2014 1755   LABSPEC 1.037 (H) 10/30/2024 0149   LABSPEC 1.002 11/15/2014 1755   PHURINE 6.0 10/30/2024 0149   GLUCOSEU NEGATIVE 10/30/2024 0149   GLUCOSEU Negative 11/15/2014 1755   HGBUR SMALL (A) 10/30/2024 0149   BILIRUBINUR NEGATIVE 10/30/2024 0149   BILIRUBINUR Negative 11/15/2014 1755   KETONESUR 80 (A) 10/30/2024 0149   PROTEINUR >=300 (A)  10/30/2024 0149   NITRITE NEGATIVE 10/30/2024 0149   LEUKOCYTESUR NEGATIVE 10/30/2024 0149   LEUKOCYTESUR Negative 11/15/2014 1755   Sepsis Labs: @LABRCNTIP (procalcitonin:4,lacticidven:4) ) Recent Results (from the past 240 hours)  Resp panel by RT-PCR (RSV, Flu A&B, Covid) Anterior Nasal Swab     Status: None   Collection Time: 10/29/24  8:43 PM   Specimen: Anterior Nasal Swab  Result Value Ref Range Status   SARS Coronavirus 2 by RT PCR NEGATIVE NEGATIVE Final    Comment: (NOTE) SARS-CoV-2 target nucleic acids are NOT DETECTED.  The SARS-CoV-2 RNA is generally detectable in upper respiratory specimens during the acute phase of infection. The lowest concentration of SARS-CoV-2 viral copies this assay can detect is 138 copies/mL. A negative result does not preclude SARS-Cov-2 infection and should not be used as the sole basis for treatment or other patient management decisions. A negative result may occur with  improper specimen collection/handling, submission of specimen other than nasopharyngeal swab, presence of viral mutation(s) within the areas targeted by this assay, and inadequate number of viral copies(<138 copies/mL). A negative result must be combined with clinical observations, patient history, and epidemiological information. The expected result is Negative.  Fact Sheet for Patients:  bloggercourse.com  Fact Sheet for Healthcare Providers:  seriousbroker.it  This test is no t yet approved or cleared by the [place]  FDA and  has been authorized for detection and/or diagnosis of SARS-CoV-2 by FDA under an Emergency Use Authorization (EUA). This EUA will remain  in effect (meaning this test can be used) for the duration of the COVID-19 declaration under Section 564(b)(1) of the Act, 21 U.S.C.section 360bbb-3(b)(1), unless the authorization is terminated  or revoked sooner.       Influenza A by PCR NEGATIVE  NEGATIVE Final   Influenza B by PCR NEGATIVE NEGATIVE Final    Comment: (NOTE) The Xpert Xpress SARS-CoV-2/FLU/RSV plus assay is intended as an aid in the diagnosis of influenza from Nasopharyngeal swab specimens and should not be used as a sole basis for treatment. Nasal washings and aspirates are unacceptable for Xpert Xpress SARS-CoV-2/FLU/RSV testing.  Fact Sheet for Patients: bloggercourse.com  Fact Sheet for Healthcare Providers: seriousbroker.it  This test is not yet approved or cleared by the [place]  FDA and has been authorized for detection and/or diagnosis of SARS-CoV-2 by FDA under an Emergency Use Authorization (EUA). This EUA will remain in effect (meaning this test can be used) for the duration of the COVID-19 declaration under Section 564(b)(1) of the Act, 21 U.S.C. section 360bbb-3(b)(1), unless the authorization is terminated or revoked.     Resp Syncytial Virus by PCR NEGATIVE NEGATIVE Final    Comment: (NOTE) Fact Sheet for Patients: bloggercourse.com  Fact Sheet for Healthcare Providers: seriousbroker.it  This test is not yet approved or cleared by the [place]  FDA and has been authorized for detection and/or diagnosis of SARS-CoV-2 by FDA under an Emergency Use Authorization (EUA). This EUA will remain in effect (meaning this test can be used) for the duration of the COVID-19 declaration under Section 564(b)(1) of the Act, 21 U.S.C. section 360bbb-3(b)(1), unless the authorization is terminated or revoked.  Performed at [facility], [address], [place], [place] [address]   Blood culture (routine x 2)     Status: Abnormal   Collection Time: 10/30/24  1:49 AM   Specimen: BLOOD  Result Value Ref Range Status   Specimen Description   Final    BLOOD RIGHT ANTECUBITAL Performed at [facility], [address]  [place], [place] [address]    Special Requests   Final    BOTTLES DRAWN AEROBIC AND ANAEROBIC Blood Culture results may not be optimal due to an inadequate volume of blood received in culture bottles Performed at [facility], [address], [place], [place] [address]    Culture  Setup Time   Final    GRAM POSITIVE COCCI ANAEROBIC BOTTLE ONLY CRITICAL RESULT CALLED TO, READ BACK BY AND VERIFIED WITH:   MITCHELL LILLISTON PHARMD 10/31/2024 @ 0429 BY DD GRAM POSITIVE RODS AEROBIC BOTTLE ONLY RBV CHRISTINE SHADE PHARM D 10/31/2024 @0738  BY DD    Culture (A)  Final    ALLOIOCOCCUS OTITIS CORYNEBACTERIUM PSEUDODIPHTHERIAE Standardized susceptibility testing for this organism is not available. Performed at [facility], [address] [place], [place] [address]    Report Status 11/03/2024 FINAL  Final  Blood culture (routine x 2)     Status: Abnormal (Preliminary result)   Collection Time: 10/30/24  1:49 AM   Specimen: BLOOD LEFT HAND  Result Value Ref Range Status   Specimen Description   Final    BLOOD LEFT HAND Performed at [facility],  [address], [place], [place] [address]    Special Requests   Final    BOTTLES DRAWN AEROBIC AND ANAEROBIC Blood Culture results may not be optimal due to an inadequate volume of blood received in culture bottles Performed at [facility], [address], [place], [place] [address]    Culture  Setup Time   Final    GRAM POSITIVE COCCI ANAEROBIC BOTTLE ONLY CRITICAL VALUE NOTED.  VALUE IS CONSISTENT WITH PREVIOUSLY REPORTED AND CALLED VALUE. GRAM POSITIVE RODS AEROBIC BOTTLE ONLY CRITICAL RESULT CALLED TO, READ BACK BY AND VERIFIED WITH: PHARMD NICK.G AT 0630 ON 10/31/2024 BY T.SAAD.    Culture (A)  Final    ALLOIOCOCCUS OTITIS Standardized susceptibility testing for this organism is not available. GRAM POSITIVE RODS REPEATING FOR IDENTIFICATION Performed at [facility], [address], [place], [place] [address]    Report Status PENDING  Incomplete  Blood Culture ID Panel (Reflexed)     Status: None   Collection Time: 10/30/24  1:49 AM  Result Value Ref Range Status   Enterococcus faecalis NOT DETECTED NOT DETECTED Final   Enterococcus Faecium NOT DETECTED NOT DETECTED Final   Listeria monocytogenes NOT DETECTED NOT DETECTED Final   Staphylococcus species NOT DETECTED NOT DETECTED Final   Staphylococcus aureus (BCID) NOT DETECTED NOT DETECTED Final   Staphylococcus epidermidis NOT DETECTED NOT DETECTED Final   Staphylococcus lugdunensis NOT DETECTED NOT DETECTED Final   Streptococcus species NOT DETECTED NOT DETECTED Final   Streptococcus agalactiae NOT DETECTED NOT DETECTED Final   Streptococcus pneumoniae NOT DETECTED NOT DETECTED Final   Streptococcus pyogenes NOT DETECTED NOT DETECTED Final   A.calcoaceticus-baumannii NOT DETECTED NOT DETECTED Final   Bacteroides fragilis NOT DETECTED NOT DETECTED Final   Enterobacterales NOT DETECTED NOT DETECTED Final   Enterobacter cloacae complex NOT DETECTED NOT DETECTED Final   Escherichia coli NOT DETECTED NOT DETECTED Final   Klebsiella aerogenes NOT DETECTED NOT DETECTED Final   Klebsiella oxytoca NOT DETECTED NOT DETECTED Final   Klebsiella pneumoniae NOT DETECTED NOT DETECTED Final   Proteus species NOT DETECTED NOT DETECTED Final   Salmonella species NOT DETECTED NOT DETECTED Final   Serratia marcescens NOT DETECTED NOT DETECTED Final   Haemophilus influenzae NOT DETECTED NOT DETECTED Final   Neisseria meningitidis NOT DETECTED NOT DETECTED Final   Pseudomonas aeruginosa NOT DETECTED NOT DETECTED Final   Stenotrophomonas maltophilia NOT DETECTED NOT DETECTED Final   Candida albicans NOT DETECTED NOT DETECTED Final   Candida auris NOT DETECTED NOT DETECTED Final   Candida glabrata NOT DETECTED NOT DETECTED Final   Candida krusei NOT DETECTED NOT DETECTED Final   Candida parapsilosis NOT DETECTED NOT DETECTED  Final   Candida tropicalis NOT DETECTED NOT DETECTED Final   Cryptococcus neoformans/gattii NOT DETECTED NOT DETECTED Final    Comment: Performed at [facility], [address], [place], [place] [address]  MRSA Next Gen by PCR, Nasal     Status: None   Collection Time: 10/30/24  2:07 PM   Specimen: Nasal Mucosa; Nasal Swab  Result Value Ref Range Status   MRSA by PCR Next Gen NOT DETECTED NOT DETECTED Final    Comment: (NOTE) The GeneXpert MRSA Assay (FDA approved for NASAL specimens only), is one component of a comprehensive MRSA colonization surveillance program. It is not intended to diagnose MRSA infection nor to guide or monitor treatment for MRSA infections. Test performance is not FDA approved in patients less than 2 years  old. Performed at [facility], [address], [place], [place] [address]      Scheduled Meds:  dextromethorphan -guaiFENesin   1 tablet Oral BID   enoxaparin  (LOVENOX ) injection  40 mg Subcutaneous Q24H   folic acid   1 mg Oral Daily   levETIRAcetam   500 mg Intravenous Q12H   lipase/protease/amylase  36,000 Units Oral TID AC   multivitamin with minerals  1 tablet Oral Daily   phenobarbital   32.4 mg Oral Q8H   polyethylene glycol  17 g Oral BID   senna-docusate  1 tablet Oral BID   sodium chloride  flush  3 mL Intravenous Q12H   sodium chloride  flush  3 mL Intravenous Q12H   thiamine  (VITAMIN B1) injection  100 mg Intravenous Daily   Continuous Infusions:  ampicillin -sulbactam (UNASYN ) IV 3 g (11/03/24 1324)   vancomycin  1,250 mg (11/03/24 1735)    Procedures/Studies: CT CHEST ABDOMEN PELVIS W CONTRAST Result Date: 10/30/2024 CLINICAL DATA:  Abdominal pain for 1 day, history of pancreatitis EXAM: CT CHEST, ABDOMEN, AND PELVIS WITH CONTRAST TECHNIQUE: Multidetector CT imaging of the chest, abdomen and pelvis was performed following the standard protocol during bolus administration of intravenous contrast. RADIATION DOSE  REDUCTION: This exam was performed according to the departmental dose-optimization program which includes automated exposure control, adjustment of the mA and/or kV according to patient size and/or use of iterative reconstruction technique. CONTRAST:  OMNIPAQUE  IOHEXOL  300 MG/ML  SOLN COMPARISON:  06/16/2024 FINDINGS: CT CHEST FINDINGS Cardiovascular: Mild atherosclerotic calcifications of the aorta are noted. No aneurysmal dilatation is seen. Heart is not significantly enlarged in size. Minimal coronary calcifications are noted. Pulmonary artery as visualized is within normal limits although not timed for embolus evaluation. Mediastinum/Nodes: Thoracic inlet is within normal limits. No hilar or mediastinal adenopathy is noted. The esophagus is unremarkable. Lungs/Pleura: The lungs are well aerated bilaterally. Focal consolidation is noted in right lower lobe centrally. Patchy infiltrate is also noted in the inferior aspect of the right middle lobe. These changes are consistent with multifocal infiltrate. Musculoskeletal: Degenerative changes of the thoracic spine are noted. CT ABDOMEN PELVIS FINDINGS Hepatobiliary: Fatty infiltration of the liver is noted. The gallbladder is unremarkable. Pancreas: Pancreas is well visualized without evidence of pancreatic necrosis the tail appears atrophic. Some peripancreatic inflammatory changes and fluid are noted surrounding the head and uncinate process consistent with focal pancreatitis. Spleen: Normal in size without focal abnormality. Adrenals/Urinary Tract: Adrenal glands are within normal limits. Kidneys demonstrate a normal enhancement pattern. Small 1 cm cyst is noted in the lower pole of the right kidney. This appears simple in nature and no further follow-up is recommended. No obstructive changes are seen. The bladder is over distended. Stomach/Bowel: Stomach is within normal limits. Appendix appears normal. No evidence of bowel wall thickening, distention, or  inflammatory changes. Small bowel and stomach are unremarkable. Vascular/Lymphatic: No significant vascular findings are present. No enlarged abdominal or pelvic lymph nodes. Reproductive: Prostate is unremarkable. Other: No abdominal wall hernia or abnormality. No abdominopelvic ascites. Musculoskeletal: No acute or significant osseous findings. IMPRESSION: CT of the chest: Patchy multifocal infiltrate in the right lung. CT of the abdomen and pelvis: Changes consistent with focal pancreatitis in the head and uncinate process. Electronically Signed   By: Oneil Devonshire M.D.   On: 10/30/2024 00:23   DG Chest Port 1 View Result Date: 10/12/2024 CLINICAL DATA:  Shortness of breath EXAM: PORTABLE CHEST 1 VIEW COMPARISON:  Chest x-ray 08/15/2024 FINDINGS: The heart size and mediastinal contours are  within normal limits. Both lungs are clear. There is mild elevation of the left hemidiaphragm. The visualized skeletal structures are unremarkable. IMPRESSION: No active disease. Electronically Signed   By: Greig Pique M.D.   On: 10/12/2024 21:34    Lebron JINNY Cage, MD  [facility]  If 7PM-7AM, please contact night-coverage [email or web address] 11/03/2024, 6:02 PM   LOS: 4 days   "

## 2024-11-03 NOTE — Progress Notes (Signed)
 Pharmacy Antibiotic Note  Gary [name] is a 38 y.o. male admitted on 10/29/2024 with aspiration pneumonia and Unasyn  started.  Pharmacy then consulted for vancomycin  dosing for 4/4 bottles from blood cultures growing GPC and GPR awaiting final results still  Day 5 Unasy, Day 4 Vancomycin  WBC WNL stable Afebrile Blood culture growth still in progress - GPC and GPR  Plan: Continue current vancomycin  dosing pending cx results - goal AUC 400-550 Unasyn  per Md  Height: 6' (182.9 cm) Weight: 68.2 kg (150 lb 5.7 oz) IBW/kg (Calculated) : 77.6  Temp (24hrs), Avg:99.1 F (37.3 C), Min:98.7 F (37.1 C), Max:99.5 F (37.5 C)  Recent Labs  Lab 10/29/24 2043 10/30/24 0212 10/30/24 0614 10/30/24 0801 11/01/24 0318 11/02/24 0313  WBC 12.2*  --   --  8.1 6.1 4.4  CREATININE 0.61  --  0.59*  --  0.42* 0.31*  LATICACIDVEN  --  1.9  --   --   --   --     Estimated Creatinine Clearance: 120.8 mL/min (A) (by C-G formula based on SCr of 0.31 mg/dL (L)).    Allergies[1]   Thank you for allowing pharmacy to be a part of this patients care.  [name] 11/03/2024 10:22 AM     [1]  Allergies Allergen Reactions   Risperidone  And Paliperidone Other (See Comments)    Patient states causes opposite effect (he will not take again), and gave him psychotic thoughts   Tegretol  [Carbamazepine ] Other (See Comments)    Vertigo  Paralysis   Caffeine Palpitations

## 2024-11-03 NOTE — Plan of Care (Signed)
 SABRA

## 2024-11-04 DIAGNOSIS — Z91199 Patient's noncompliance with other medical treatment and regimen due to unspecified reason: Secondary | ICD-10-CM | POA: Diagnosis not present

## 2024-11-04 DIAGNOSIS — Z5900 Homelessness unspecified: Secondary | ICD-10-CM

## 2024-11-04 DIAGNOSIS — F10139 Alcohol abuse with withdrawal, unspecified: Secondary | ICD-10-CM

## 2024-11-04 DIAGNOSIS — R569 Unspecified convulsions: Secondary | ICD-10-CM | POA: Diagnosis not present

## 2024-11-04 DIAGNOSIS — J690 Pneumonitis due to inhalation of food and vomit: Secondary | ICD-10-CM | POA: Diagnosis not present

## 2024-11-04 DIAGNOSIS — B20 Human immunodeficiency virus [HIV] disease: Secondary | ICD-10-CM

## 2024-11-04 DIAGNOSIS — K852 Alcohol induced acute pancreatitis without necrosis or infection: Secondary | ICD-10-CM | POA: Diagnosis not present

## 2024-11-04 LAB — CBC
HCT: 31.6 % — ABNORMAL LOW (ref 39.0–52.0)
Hemoglobin: 10.6 g/dL — ABNORMAL LOW (ref 13.0–17.0)
MCH: 30.8 pg (ref 26.0–34.0)
MCHC: 33.5 g/dL (ref 30.0–36.0)
MCV: 91.9 fL (ref 80.0–100.0)
Platelets: 160 K/uL (ref 150–400)
RBC: 3.44 MIL/uL — ABNORMAL LOW (ref 4.22–5.81)
RDW: 16.5 % — ABNORMAL HIGH (ref 11.5–15.5)
WBC: 3.5 K/uL — ABNORMAL LOW (ref 4.0–10.5)
nRBC: 0 % (ref 0.0–0.2)

## 2024-11-04 LAB — BASIC METABOLIC PANEL WITH GFR
Anion gap: 12 (ref 5–15)
BUN: <5 mg/dL — ABNORMAL LOW (ref 6–20)
CO2: 26 mmol/L (ref 22–32)
Calcium: 9.5 mg/dL (ref 8.9–10.3)
Chloride: 98 mmol/L (ref 98–111)
Creatinine, Ser: 0.47 mg/dL — ABNORMAL LOW (ref 0.61–1.24)
GFR, Estimated: >60 mL/min
Glucose, Bld: 109 mg/dL — ABNORMAL HIGH (ref 70–99)
Potassium: 3.9 mmol/L (ref 3.5–5.1)
Sodium: 136 mmol/L (ref 135–145)

## 2024-11-04 LAB — MRSA NEXT GEN BY PCR, NASAL: MRSA by PCR Next Gen: NOT DETECTED

## 2024-11-04 MED ORDER — AMOXICILLIN-POT CLAVULANATE 875-125 MG PO TABS
1.0000 | ORAL_TABLET | Freq: Two times a day (BID) | ORAL | Status: AC
  Administered 2024-11-04 – 2024-11-05 (×4): 1 via ORAL
  Filled 2024-11-04 (×4): qty 1

## 2024-11-04 NOTE — Progress Notes (Signed)
 PHARMACY - PHYSICIAN COMMUNICATION CRITICAL VALUE ALERT - BLOOD CULTURE IDENTIFICATION (BCID)  Gary [name] is an 38 y.o. male who presented to [facility] on 10/29/2024 with a chief complaint of abdominal pain.  Assessment:  2/4 Bcx bottles growing alloiococcus otitis, with one of these bottles growing corynebacterium pseudodiphteriae and the other one also growing GPRs but could not be identified.  Name of physician (or Provider) Contacted: Drs. Vu and Ezenduka  Current antibiotics: vancomycin  and Unasyn   Changes to prescribed antibiotics recommended:  Likely contaminants, per ID provider. Will stop vancomycin  and Unasyn  today and start Augmentin  to complete 7 days total (end date tomorrow, 12/28).  Results for orders placed or performed during the hospital encounter of 10/29/24  Blood Culture ID Panel (Reflexed) (Collected: 10/30/2024  1:49 AM)  Result Value Ref Range   Enterococcus faecalis NOT DETECTED NOT DETECTED   Enterococcus Faecium NOT DETECTED NOT DETECTED   Listeria monocytogenes NOT DETECTED NOT DETECTED   Staphylococcus species NOT DETECTED NOT DETECTED   Staphylococcus aureus (BCID) NOT DETECTED NOT DETECTED   Staphylococcus epidermidis NOT DETECTED NOT DETECTED   Staphylococcus lugdunensis NOT DETECTED NOT DETECTED   Streptococcus species NOT DETECTED NOT DETECTED   Streptococcus agalactiae NOT DETECTED NOT DETECTED   Streptococcus pneumoniae NOT DETECTED NOT DETECTED   Streptococcus pyogenes NOT DETECTED NOT DETECTED   A.calcoaceticus-baumannii NOT DETECTED NOT DETECTED   Bacteroides fragilis NOT DETECTED NOT DETECTED   Enterobacterales NOT DETECTED NOT DETECTED   Enterobacter cloacae complex NOT DETECTED NOT DETECTED   Escherichia coli NOT DETECTED NOT DETECTED   Klebsiella aerogenes NOT DETECTED NOT DETECTED   Klebsiella oxytoca NOT DETECTED NOT DETECTED   Klebsiella pneumoniae NOT DETECTED NOT DETECTED   Proteus species NOT DETECTED NOT DETECTED    Salmonella species NOT DETECTED NOT DETECTED   Serratia marcescens NOT DETECTED NOT DETECTED   Haemophilus influenzae NOT DETECTED NOT DETECTED   Neisseria meningitidis NOT DETECTED NOT DETECTED   Pseudomonas aeruginosa NOT DETECTED NOT DETECTED   Stenotrophomonas maltophilia NOT DETECTED NOT DETECTED   Candida albicans NOT DETECTED NOT DETECTED   Candida auris NOT DETECTED NOT DETECTED   Candida glabrata NOT DETECTED NOT DETECTED   Candida krusei NOT DETECTED NOT DETECTED   Candida parapsilosis NOT DETECTED NOT DETECTED   Candida tropicalis NOT DETECTED NOT DETECTED   Cryptococcus neoformans/gattii NOT DETECTED NOT DETECTED    Lacinda Moats, PharmD Clinical Pharmacist  12/27/202512:13 PM

## 2024-11-04 NOTE — Consult Note (Signed)
 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 This time admitted 12/21 for initial presentation of fever, elevated blood pressure and another bout pancreatitis also with imaging suggestion of right lung opacity  Presentation with sepsis either pna/pancreatitis  Placed on empiric vanc/amp-sulb with improvement / resolved sepsis  Initial bcx multiple species including Alloiococcus otitidis and corynebacterium species. These are usually contaminant, vs related to aspiration. Their pathogenic implication on blood cx is unclear unless persistent bacteremia. It is low yield to repeat bcx now but will still do, eitherway not clear if amp-sulb will cover either but clinical picture more so of pna    For his hiv Lab Results  Component Value Date   CD4TCELL 36 10/30/2024   CD4TABS 161 (L) 10/30/2024   Earlier this year was 460. Acute drop likely acute illness but not taking ART certainly can have continued slow drop  Noncompliance an issue with potential resistance so won't restart art  Appointment is 11/27/24 @ 3pm  Will plan to repeat cd4 then to see if any bump and if really needs pjp prophy    Plan: Change all abx to amox-clav No art Clinic visit above arranged and if patient makes it there will start Maintain standard isolation precaution Alcohol  withdrawal management per primary team Out of abundant precaution repeat bcx ordered, but likely  low yield. If positive for same organism will consider source w/u Will follow cx Otherwise on if cx negative by 12/29 ok to discharge from id standpoint Discussed with primary team      ------------------------------------------------ Principal Problem:   Acute alcoholic pancreatitis Active Problems:   HIV (human immunodeficiency virus infection) (HCC)   Thrombocytopenia   High anion gap metabolic acidosis   Aspiration pneumonia (HCC)   Tobacco use disorder   GAD (generalized anxiety disorder)   PTSD (post-traumatic stress disorder)   Alcohol  withdrawal (HCC)   SIRS (systemic inflammatory response syndrome) (HCC)   Bipolar disorder (HCC)   History of seizure   Gram-positive bacteremia    HPI: Gary [name] is a 38 y.o. male Patient 38 yo male with above problem several ed visits/admission for alcohol  abuse related issues. This time admitted 12/21 for initial presentation of fever, elevated blood pressure and another bout pancreatitis also with imaging suggestion of right lung opacity  He presented with epigastric pain and nausea and visualized tremor and appeared restless initially on exam H&P intake.   He didn't have other complaint  Patient admitted with ct imaging pulm infiltrate and pancreatitis. Lipase elevated. Etoh level actually low.  bsAbx started for fever on presentation Bcx returned with coryne and alloiococcus species  Sepsis had resolved  Id consulted on hd # 6. He has no complaint at this time He remains homeless  Multiple previous admission id consulted and has tried to get him into clinic but he continue to no show  He said overall feeling better    Family  History  Problem Relation Age of Onset   Hypertension Mother    Alcohol  abuse Mother    Alcoholism Mother    Alcohol  abuse Father    Colon cancer Other    Other Other    Cancer Other     Social History[1]  Allergies[2]  Review of Systems: ROS All Other ROS was negative, except  mentioned above   Past Medical History:  Diagnosis Date   Alcohol  abuse    Alcohol -induced pancreatitis 04/16/2022   Anxiety    Bipolar 2 disorder (HCC)    HIV (human immunodeficiency virus infection) (HCC)    Pancreatitis    PTSD (post-traumatic stress disorder)    Schizophrenia (HCC)    Seizures (HCC)    Subdural hematoma (HCC)        Scheduled Meds:  dextromethorphan -guaiFENesin   1 tablet Oral BID   enoxaparin  (LOVENOX ) injection  40 mg Subcutaneous Q24H   folic acid   1 mg Oral Daily   levETIRAcetam   500 mg Intravenous Q12H   lipase/protease/amylase  36,000 Units Oral TID AC   multivitamin with minerals  1 tablet Oral Daily   phenobarbital   32.4 mg Oral Q8H   polyethylene glycol  17 g Oral BID   senna-docusate  1 tablet Oral BID   sodium chloride  flush  3 mL Intravenous Q12H   sodium chloride  flush  3 mL Intravenous Q12H   thiamine  (VITAMIN B1) injection  100 mg Intravenous Daily   Continuous Infusions:  ampicillin -sulbactam (UNASYN ) IV 3 g (11/04/24 0832)   PRN Meds:.acetaminophen  **OR** acetaminophen , HYDROmorphone  (DILAUDID ) injection, LORazepam , LORazepam , nicotine , ondansetron  **OR** ondansetron  (ZOFRAN ) IV, mouth rinse, oxyCODONE , sodium chloride  flush   OBJECTIVE: Blood pressure 109/80, pulse 74, temperature 98.2 F (36.8 C), temperature source Oral, resp. rate 18, height 6' (1.829 m), weight 68.2 kg, SpO2 94%.  Physical Exam  General/constitutional: no distress, pleasant HEENT: Normocephalic, PER, Conj Clear, EOMI, Oropharynx clear Neck supple CV: rrr no mrg Lungs: clear to auscultation, normal respiratory effort Abd: Soft, Nontender Ext: no edema Skin: No Rash Neuro: nonfocal MSK: no peripheral joint swelling/tenderness/warmth; back spines nontender    Lab Results Lab Results  Component Value Date   WBC 3.5 (L) 11/04/2024   HGB 10.6 (L) 11/04/2024   HCT 31.6 (L) 11/04/2024   MCV 91.9 11/04/2024   PLT 160 11/04/2024    Lab Results   Component Value Date   CREATININE 0.47 (L) 11/04/2024   BUN <5 (L) 11/04/2024   NA 136 11/04/2024   K 3.9 11/04/2024   CL 98 11/04/2024   CO2 26 11/04/2024    Lab Results  Component Value Date   ALT 30 11/01/2024   AST 38 11/01/2024   ALKPHOS 106 11/01/2024   BILITOT 0.4 11/01/2024      Microbiology: Recent Results (from the past 240 hours)  Resp panel by RT-PCR (RSV, Flu A&B, Covid) Anterior Nasal Swab     Status: None   Collection Time: 10/29/24  8:43 PM   Specimen: Anterior Nasal Swab  Result Value Ref Range Status   SARS Coronavirus 2 by RT PCR NEGATIVE NEGATIVE Final    Comment: (NOTE) SARS-CoV-2 target nucleic acids are NOT DETECTED.  The SARS-CoV-2 RNA is generally detectable in upper respiratory specimens during the acute phase of infection. The lowest concentration of SARS-CoV-2 viral copies this assay can detect is 138 copies/mL. A negative result does not preclude SARS-Cov-2 infection and should not be used as the sole basis for treatment or other patient management decisions. A negative  result may occur with  improper specimen collection/handling, submission of specimen other than nasopharyngeal swab, presence of viral mutation(s) within the areas targeted by this assay, and inadequate number of viral copies(<138 copies/mL). A negative result must be combined with clinical observations, patient history, and epidemiological information. The expected result is Negative.  Fact Sheet for Patients:  bloggercourse.com  Fact Sheet for Healthcare Providers:  seriousbroker.it  This test is no t yet approved or cleared by the [place]  FDA and  has been authorized for detection and/or diagnosis of SARS-CoV-2 by FDA under an Emergency Use Authorization (EUA). This EUA will remain  in effect (meaning this test can be used) for the duration of the COVID-19 declaration under Section 564(b)(1) of the Act,  21 U.S.C.section 360bbb-3(b)(1), unless the authorization is terminated  or revoked sooner.       Influenza A by PCR NEGATIVE NEGATIVE Final   Influenza B by PCR NEGATIVE NEGATIVE Final    Comment: (NOTE) The Xpert Xpress SARS-CoV-2/FLU/RSV plus assay is intended as an aid in the diagnosis of influenza from Nasopharyngeal swab specimens and should not be used as a sole basis for treatment. Nasal washings and aspirates are unacceptable for Xpert Xpress SARS-CoV-2/FLU/RSV testing.  Fact Sheet for Patients: bloggercourse.com  Fact Sheet for Healthcare Providers: seriousbroker.it  This test is not yet approved or cleared by the [place]  FDA and has been authorized for detection and/or diagnosis of SARS-CoV-2 by FDA under an Emergency Use Authorization (EUA). This EUA will remain in effect (meaning this test can be used) for the duration of the COVID-19 declaration under Section 564(b)(1) of the Act, 21 U.S.C. section 360bbb-3(b)(1), unless the authorization is terminated or revoked.     Resp Syncytial Virus by PCR NEGATIVE NEGATIVE Final    Comment: (NOTE) Fact Sheet for Patients: bloggercourse.com  Fact Sheet for Healthcare Providers: seriousbroker.it  This test is not yet approved or cleared by the [place]  FDA and has been authorized for detection and/or diagnosis of SARS-CoV-2 by FDA under an Emergency Use Authorization (EUA). This EUA will remain in effect (meaning this test can be used) for the duration of the COVID-19 declaration under Section 564(b)(1) of the Act, 21 U.S.C. section 360bbb-3(b)(1), unless the authorization is terminated or revoked.  Performed at [facility], [address], [place], [place] [address]   Blood culture (routine x 2)     Status: Abnormal   Collection Time: 10/30/24  1:49 AM   Specimen: BLOOD  Result Value  Ref Range Status   Specimen Description   Final    BLOOD RIGHT ANTECUBITAL Performed at [facility], [address] [place], [place] [address]    Special Requests   Final    BOTTLES DRAWN AEROBIC AND ANAEROBIC Blood Culture results may not be optimal due to an inadequate volume of blood received in culture bottles Performed at [facility], [address] [place], [place] [address]    Culture  Setup Time   Final    GRAM POSITIVE COCCI ANAEROBIC BOTTLE ONLY CRITICAL RESULT CALLED TO, READ BACK BY AND VERIFIED WITH:   MITCHELL LILLISTON PHARMD 10/31/2024 @ 0429 BY DD GRAM POSITIVE RODS AEROBIC BOTTLE ONLY [facility] CHRISTINE SHADE PHARM D 10/31/2024 @0738  BY DD    Culture (A)  Final    ALLOIOCOCCUS OTITIS CORYNEBACTERIUM PSEUDODIPHTHERIAE Standardized susceptibility testing for this organism is not available. Performed at [facility], [address] [place], [place] [address]    Report  Status 11/03/2024 FINAL  Final  Blood culture (routine x 2)     Status: Abnormal (Preliminary result)   Collection Time: 10/30/24  1:49 AM   Specimen: BLOOD LEFT HAND  Result Value Ref Range Status   Specimen Description   Final    BLOOD LEFT HAND Performed at [facility], [address], [place], [place] [address]    Special Requests   Final    BOTTLES DRAWN AEROBIC AND ANAEROBIC Blood Culture results may not be optimal due to an inadequate volume of blood received in culture bottles Performed at [facility], [address], [place], [place] [address]    Culture  Setup Time   Final    GRAM POSITIVE COCCI ANAEROBIC BOTTLE ONLY CRITICAL VALUE NOTED.  VALUE IS CONSISTENT WITH PREVIOUSLY REPORTED AND CALLED VALUE. GRAM POSITIVE RODS AEROBIC BOTTLE ONLY CRITICAL RESULT CALLED TO, READ BACK BY AND VERIFIED WITH: PHARMD NICK.G AT 0630 ON 10/31/2024 BY T.SAAD.    Culture (A)  Final    ALLOIOCOCCUS OTITIS Standardized susceptibility testing  for this organism is not available. GRAM POSITIVE RODS CALL MICROBIOLOGY LAB IF SENSITIVITIES ARE REQUIRED. Performed at [facility], [address], [place], [place] [address]    Report Status PENDING  Incomplete  Blood Culture ID Panel (Reflexed)     Status: None   Collection Time: 10/30/24  1:49 AM  Result Value Ref Range Status   Enterococcus faecalis NOT DETECTED NOT DETECTED Final   Enterococcus Faecium NOT DETECTED NOT DETECTED Final   Listeria monocytogenes NOT DETECTED NOT DETECTED Final   Staphylococcus species NOT DETECTED NOT DETECTED Final   Staphylococcus aureus (BCID) NOT DETECTED NOT DETECTED Final   Staphylococcus epidermidis NOT DETECTED NOT DETECTED Final   Staphylococcus lugdunensis NOT DETECTED NOT DETECTED Final   Streptococcus species NOT DETECTED NOT DETECTED Final   Streptococcus agalactiae NOT DETECTED NOT DETECTED Final   Streptococcus pneumoniae NOT DETECTED NOT DETECTED Final   Streptococcus pyogenes NOT DETECTED NOT DETECTED Final   A.calcoaceticus-baumannii NOT DETECTED NOT DETECTED Final   Bacteroides fragilis NOT DETECTED NOT DETECTED Final   Enterobacterales NOT DETECTED NOT DETECTED Final   Enterobacter cloacae complex NOT DETECTED NOT DETECTED Final   Escherichia coli NOT DETECTED NOT DETECTED Final   Klebsiella aerogenes NOT DETECTED NOT DETECTED Final   Klebsiella oxytoca NOT DETECTED NOT DETECTED Final   Klebsiella pneumoniae NOT DETECTED NOT DETECTED Final   Proteus species NOT DETECTED NOT DETECTED Final   Salmonella species NOT DETECTED NOT DETECTED Final   Serratia marcescens NOT DETECTED NOT DETECTED Final   Haemophilus influenzae NOT DETECTED NOT DETECTED Final   Neisseria meningitidis NOT DETECTED NOT DETECTED Final   Pseudomonas aeruginosa NOT DETECTED NOT DETECTED Final   Stenotrophomonas maltophilia NOT DETECTED NOT DETECTED Final   Candida albicans NOT DETECTED NOT DETECTED Final   Candida auris NOT DETECTED NOT DETECTED  Final   Candida glabrata NOT DETECTED NOT DETECTED Final   Candida krusei NOT DETECTED NOT DETECTED Final   Candida parapsilosis NOT DETECTED NOT DETECTED Final   Candida tropicalis NOT DETECTED NOT DETECTED Final   Cryptococcus neoformans/gattii NOT DETECTED NOT DETECTED Final    Comment: Performed at [facility], [address] [place], [place] [address]  MRSA Next Gen by PCR, Nasal     Status: None   Collection Time: 10/30/24  2:07 PM   Specimen: Nasal Mucosa; Nasal Swab  Result Value Ref Range Status   MRSA by  PCR Next Gen NOT DETECTED NOT DETECTED Final    Comment: (NOTE) The GeneXpert MRSA Assay (FDA approved for NASAL specimens only), is one component of a comprehensive MRSA colonization surveillance program. It is not intended to diagnose MRSA infection nor to guide or monitor treatment for MRSA infections. Test performance is not FDA approved in patients less than 67 years old. Performed at [facility], [address] [place], [place] [address]      Serology:    Imaging: If present, new imagings (plain films, ct scans, and mri) have been personally visualized and interpreted; radiology reports have been reviewed. Decision making incorporated into the Impression / Recommendations.  12/22 ct chest abd pelv CT of the chest: Patchy multifocal infiltrate in the right lung.   CT of the abdomen and pelvis: Changes consistent with focal pancreatitis in the head and uncinate process.      Constance ONEIDA Passer, MD [facility] [phone] pager    11/04/2024, 12:03 PM     [1]  Social History Tobacco Use   Smoking status: Every Day    Current packs/day: 0.50    Average packs/day: 0.5 packs/day for 22.0 years (11.0 ttl pk-yrs)    Types: Cigarettes    Start date: 2004    Passive exposure: Current   Smokeless tobacco: Never   Tobacco comments:    unable to smoke while incarcerated 6+ months  02/13/20  Vaping Use   Vaping status: Never Used  Substance Use Topics   Alcohol  use: Yes    Alcohol /week: 105.0 standard drinks of alcohol     Types: 105 Cans of beer per week    Comment: drinks every day, all day, whatever I can get my hands on. Half a gallon of vodka 12/16   Drug use: Not Currently    Types: Methamphetamines, Cocaine    Comment: last used 10/22/2023  [2]  Allergies Allergen Reactions   Risperidone  And Paliperidone Other (See Comments)    Patient states causes opposite effect (he will not take again), and gave him psychotic thoughts   Tegretol  [Carbamazepine ] Other (See Comments)    Vertigo  Paralysis   Caffeine Palpitations   "

## 2024-11-04 NOTE — Progress Notes (Signed)
 "          PROGRESS NOTE  Gary [name] FMW:[id number] DOB: Feb 20, 1986 DOA: 10/29/2024 PCP: Pcp, No  Brief History:  38yo with h/o alcohol  abuse, chronic pancreatitis with frequent hospitalizations for acute on chronic pancreatitis, HIV/AIDS, medication noncompliance, seizure disorder, bipolar schizophrenia, PTSD and homelessness who has had frequent recent ER visits who presented on 12/21 with abdominal pain. Continues with active drinking. CT with focal pancreatitis of the head and uncinate process as well as patchy infiltrate in the R lung. He was given Zosyn , IV, and Dilaudid .     Today, patient denies any new complaints.   Assessment/Plan:  Acute alcoholic pancreatitis Acute on chronic pancreatitis in the setting of continued alcohol  abuse CT abdomen pelvis showing acute pancreatitis of uncinate process and pancreatic head lipase 282 Continue Zofran  as needed Restart Creon , pain management 43 ER visits and 7 admissions in the last 6 months Needs complete abstinence/cessation of alcohol  given his recurrent pancreatitis events, as this is the etiology to his multiple hospitalizations and ED visits  Aspiration pneumonia SIRS Patient meets SIRS criteria with fever to 102.7, tachycardia, tachypnea Chest x-ray showing evidence for aspiration pneumonia In the ED patient received IV Zosyn  -> Unasyn   Alcohol  withdrawal Patient with continued alcohol  abuse, last drink day of admission, shortly after last dc Monitor on CIWA protocol  Counsel on complete alcohol  cessation/abstinence He has had severe withdrawal on prior hospitalizations Transfer to SDU Continue phenobarbital  taper per pharmacy Psychiatry consulted PCCM consulted; if he transitions to IV Phenobarb, they will assume care  ??Gram positive bacteremia 12/22 blood culture = staph species on BCID; also has GPR, growing alloiococcus otitis, cornyebacterium pseudodiptheriae. Corynebacterium all possibly contaminant vs  related to aspiration pneumonia ID consulted recommend repeat blood culture if persistent, we will evaluate for source control, if negative will confirm contaminant D/C vanc, Unasyn ---> switch to p.o. Augmentin  to complete 7 days of antibiotics Follow blood culture  Hypokalemia/hypomagnesemia Replace as needed  HIV Not currently taking Biktarvy , Bactrim  ID on board, no ART due to noncompliance Outpatient follow-up with infectious disease CD 4 161/36%  Bipolar disorder (HCC) PTSD (post-traumatic stress disorder) GAD (generalized anxiety disorder) Previously on Seroquel , not currently taking medication Psychiatry consulted, recommends increasing gabapentin  dose He does not currently meet IVC crtieria  Thrombocytopenia Likely associated with bone marrow suppression in the setting of alcohol  use  History of seizure Associated with ETOH withdrawal, last admitted for this issue from 10/29-11/3 Has not been taking Keppra  at home Continue IV Keppra  500 mg twice daily Continue Ativan  2 mg every 6 as needed for seizure Seizure precautions ordered  Tobacco use disorder Cessation encouraged cessation Nicotine  patch ordered         Consultants: Psychiatry PCCM   Procedures: None   Antibiotics: Augmentin          Family Communication:  no Family at bedside   Code Status:  FULL   DVT Prophylaxis:   [place] Lovenox    Procedures: As Listed in Progress Note Above  Antibiotics: Vanc 12.22>> 12/27 Unasyn  12/22>> 12/27 Augmentin  12/27       Objective: Vitals:   11/03/24 1313 11/03/24 2026 11/04/24 0544 11/04/24 1311  BP: 118/87 104/84 109/80 98/72  Pulse: 84 85 74 82  Resp: 16 20 18 16   Temp: 98.4 F (36.9 C) 98.3 F (36.8 C) 98.2 F (36.8 C) 98.5 F (36.9 C)  TempSrc: Oral Oral Oral Oral  SpO2: 93% 95% 94% 95%  Weight:      Height:  Intake/Output Summary (Last 24 hours) at 11/04/2024 2008 Last data filed at 11/04/2024 2000 Gross per 24 hour   Intake 1198.82 ml  Output 1300 ml  Net -101.18 ml   Weight change:  Exam: General: NAD  Cardiovascular: S1, S2 present Respiratory: CTAB Abdomen: Soft, tender, nondistended, bowel sounds present Musculoskeletal: No bilateral pedal edema noted Skin: Normal Psychiatry: Unable to asses    Data Reviewed: I have personally reviewed following labs and imaging studies Basic Metabolic Panel: Recent Labs  Lab 10/29/24 2043 10/30/24 0614 11/01/24 0318 11/02/24 0313 11/04/24 0542  NA 141 137 136 133* 136  K 4.0 3.8 3.3* 3.6 3.9  CL 98 98 93* 94* 98  CO2 20* 19* 28 27 26   GLUCOSE 115* 92 74 96 109*  BUN 8 <5* <5* <5* <5*  CREATININE 0.61 0.59* 0.42* 0.31* 0.47*  CALCIUM  9.3 8.6* 9.4 8.7* 9.5  MG  --   --  1.5* 1.8  --    Liver Function Tests: Recent Labs  Lab 10/29/24 2043 10/30/24 0614 11/01/24 0318  AST 48* 34 38  ALT 44 29 30  ALKPHOS 138* 109 106  BILITOT 0.4 0.5 0.4  PROT 8.1 6.5 6.7  ALBUMIN 4.4 3.5 3.4*   Recent Labs  Lab 10/29/24 2043  LIPASE 282*   No results for input(s): AMMONIA in the last 168 hours. Coagulation Profile: No results for input(s): INR, PROTIME in the last 168 hours. CBC: Recent Labs  Lab 10/29/24 2043 10/30/24 0801 11/01/24 0318 11/02/24 0313 11/04/24 0542  WBC 12.2* 8.1 6.1 4.4 3.5*  NEUTROABS  --   --  4.6  --   --   HGB 13.8 10.4* 11.1* 10.4* 10.6*  HCT 42.0 31.6* 33.6* 31.4* 31.6*  MCV 93.8 92.7 92.3 91.3 91.9  PLT 136* 84* 85* 90* 160   Cardiac Enzymes: No results for input(s): CKTOTAL, CKMB, CKMBINDEX, TROPONINI in the last 168 hours. BNP: Invalid input(s): POCBNP CBG: No results for input(s): GLUCAP in the last 168 hours. HbA1C: No results for input(s): HGBA1C in the last 72 hours. Urine analysis:    Component Value Date/Time   COLORURINE YELLOW 10/30/2024 0149   APPEARANCEUR CLEAR 10/30/2024 0149   APPEARANCEUR Clear 11/15/2014 1755   LABSPEC 1.037 (H) 10/30/2024 0149   LABSPEC 1.002  11/15/2014 1755   PHURINE 6.0 10/30/2024 0149   GLUCOSEU NEGATIVE 10/30/2024 0149   GLUCOSEU Negative 11/15/2014 1755   HGBUR SMALL (A) 10/30/2024 0149   BILIRUBINUR NEGATIVE 10/30/2024 0149   BILIRUBINUR Negative 11/15/2014 1755   KETONESUR 80 (A) 10/30/2024 0149   PROTEINUR >=300 (A) 10/30/2024 0149   NITRITE NEGATIVE 10/30/2024 0149   LEUKOCYTESUR NEGATIVE 10/30/2024 0149   LEUKOCYTESUR Negative 11/15/2014 1755   Sepsis Labs: @LABRCNTIP (procalcitonin:4,lacticidven:4) ) Recent Results (from the past 240 hours)  Resp panel by RT-PCR (RSV, Flu A&B, Covid) Anterior Nasal Swab     Status: None   Collection Time: 10/29/24  8:43 PM   Specimen: Anterior Nasal Swab  Result Value Ref Range Status   SARS Coronavirus 2 by RT PCR NEGATIVE NEGATIVE Final    Comment: (NOTE) SARS-CoV-2 target nucleic acids are NOT DETECTED.  The SARS-CoV-2 RNA is generally detectable in upper respiratory specimens during the acute phase of infection. The lowest concentration of SARS-CoV-2 viral copies this assay can detect is 138 copies/mL. A negative result does not preclude SARS-Cov-2 infection and should not be used as the sole basis for treatment or other patient management decisions. A negative result may occur with  improper  specimen collection/handling, submission of specimen other than nasopharyngeal swab, presence of viral mutation(s) within the areas targeted by this assay, and inadequate number of viral copies(<138 copies/mL). A negative result must be combined with clinical observations, patient history, and epidemiological information. The expected result is Negative.  Fact Sheet for Patients:  bloggercourse.com  Fact Sheet for Healthcare Providers:  seriousbroker.it  This test is no t yet approved or cleared by the [place]  FDA and  has been authorized for detection and/or diagnosis of SARS-CoV-2 by FDA under an Emergency Use  Authorization (EUA). This EUA will remain  in effect (meaning this test can be used) for the duration of the COVID-19 declaration under Section 564(b)(1) of the Act, 21 U.S.C.section 360bbb-3(b)(1), unless the authorization is terminated  or revoked sooner.       Influenza A by PCR NEGATIVE NEGATIVE Final   Influenza B by PCR NEGATIVE NEGATIVE Final    Comment: (NOTE) The Xpert Xpress SARS-CoV-2/FLU/RSV plus assay is intended as an aid in the diagnosis of influenza from Nasopharyngeal swab specimens and should not be used as a sole basis for treatment. Nasal washings and aspirates are unacceptable for Xpert Xpress SARS-CoV-2/FLU/RSV testing.  Fact Sheet for Patients: bloggercourse.com  Fact Sheet for Healthcare Providers: seriousbroker.it  This test is not yet approved or cleared by the [place]  FDA and has been authorized for detection and/or diagnosis of SARS-CoV-2 by FDA under an Emergency Use Authorization (EUA). This EUA will remain in effect (meaning this test can be used) for the duration of the COVID-19 declaration under Section 564(b)(1) of the Act, 21 U.S.C. section 360bbb-3(b)(1), unless the authorization is terminated or revoked.     Resp Syncytial Virus by PCR NEGATIVE NEGATIVE Final    Comment: (NOTE) Fact Sheet for Patients: bloggercourse.com  Fact Sheet for Healthcare Providers: seriousbroker.it  This test is not yet approved or cleared by the [place]  FDA and has been authorized for detection and/or diagnosis of SARS-CoV-2 by FDA under an Emergency Use Authorization (EUA). This EUA will remain in effect (meaning this test can be used) for the duration of the COVID-19 declaration under Section 564(b)(1) of the Act, 21 U.S.C. section 360bbb-3(b)(1), unless the authorization is terminated or revoked.  Performed at [facility],  [address], [place], [place] [address]   Blood culture (routine x 2)     Status: Abnormal   Collection Time: 10/30/24  1:49 AM   Specimen: BLOOD  Result Value Ref Range Status   Specimen Description   Final    BLOOD RIGHT ANTECUBITAL Performed at [facility], [address] [place], [place] [address]    Special Requests   Final    BOTTLES DRAWN AEROBIC AND ANAEROBIC Blood Culture results may not be optimal due to an inadequate volume of blood received in culture bottles Performed at [facility], [address], [place], [place] [address]    Culture  Setup Time   Final    GRAM POSITIVE COCCI ANAEROBIC BOTTLE ONLY CRITICAL RESULT CALLED TO, READ BACK BY AND VERIFIED WITH:   MITCHELL LILLISTON PHARMD 10/31/2024 @ 0429 BY DD GRAM POSITIVE RODS AEROBIC BOTTLE ONLY RBV CHRISTINE SHADE PHARM D 10/31/2024 @0738  BY DD    Culture (A)  Final    ALLOIOCOCCUS OTITIS CORYNEBACTERIUM PSEUDODIPHTHERIAE Standardized susceptibility testing for this organism is not available. Performed at [facility], [address], [place], [place] [address]    Report Status 11/03/2024 FINAL  Final  Blood culture (routine x 2)     Status: Abnormal (Preliminary result)   Collection Time: 10/30/24  1:49 AM   Specimen: BLOOD LEFT HAND  Result Value Ref Range Status   Specimen Description   Final    BLOOD LEFT HAND Performed at [facility], [address], [place], [place] [address]    Special Requests   Final    BOTTLES DRAWN AEROBIC AND ANAEROBIC Blood Culture results may not be optimal due to an inadequate volume of blood received in culture bottles Performed at [facility], [address], [place], [place] [address]    Culture  Setup Time   Final    GRAM POSITIVE COCCI ANAEROBIC BOTTLE ONLY CRITICAL VALUE NOTED.  VALUE IS CONSISTENT WITH PREVIOUSLY REPORTED AND CALLED VALUE. GRAM POSITIVE RODS AEROBIC BOTTLE ONLY CRITICAL RESULT CALLED  TO, READ BACK BY AND VERIFIED WITH: PHARMD NICK.G AT 0630 ON 10/31/2024 BY T.SAAD.    Culture (A)  Final    ALLOIOCOCCUS OTITIS Standardized susceptibility testing for this organism is not available. GRAM POSITIVE RODS CALL MICROBIOLOGY LAB IF SENSITIVITIES ARE REQUIRED. Performed at [facility], [address] [place], [place] [address]    Report Status PENDING  Incomplete  Blood Culture ID Panel (Reflexed)     Status: None   Collection Time: 10/30/24  1:49 AM  Result Value Ref Range Status   Enterococcus faecalis NOT DETECTED NOT DETECTED Final   Enterococcus Faecium NOT DETECTED NOT DETECTED Final   Listeria monocytogenes NOT DETECTED NOT DETECTED Final   Staphylococcus species NOT DETECTED NOT DETECTED Final   Staphylococcus aureus (BCID) NOT DETECTED NOT DETECTED Final   Staphylococcus epidermidis NOT DETECTED NOT DETECTED Final   Staphylococcus lugdunensis NOT DETECTED NOT DETECTED Final   Streptococcus species NOT DETECTED NOT DETECTED Final   Streptococcus agalactiae NOT DETECTED NOT DETECTED Final   Streptococcus pneumoniae NOT DETECTED NOT DETECTED Final   Streptococcus pyogenes NOT DETECTED NOT DETECTED Final   A.calcoaceticus-baumannii NOT DETECTED NOT DETECTED Final   Bacteroides fragilis NOT DETECTED NOT DETECTED Final   Enterobacterales NOT DETECTED NOT DETECTED Final   Enterobacter cloacae complex NOT DETECTED NOT DETECTED Final   Escherichia coli NOT DETECTED NOT DETECTED Final   Klebsiella aerogenes NOT DETECTED NOT DETECTED Final   Klebsiella oxytoca NOT DETECTED NOT DETECTED Final   Klebsiella pneumoniae NOT DETECTED NOT DETECTED Final   Proteus species NOT DETECTED NOT DETECTED Final   Salmonella species NOT DETECTED NOT DETECTED Final   Serratia marcescens NOT DETECTED NOT DETECTED Final   Haemophilus influenzae NOT DETECTED NOT DETECTED Final   Neisseria meningitidis NOT DETECTED NOT DETECTED Final   Pseudomonas aeruginosa NOT DETECTED NOT DETECTED  Final   Stenotrophomonas maltophilia NOT DETECTED NOT DETECTED Final   Candida albicans NOT DETECTED NOT DETECTED Final   Candida auris NOT DETECTED NOT DETECTED Final   Candida glabrata NOT DETECTED NOT DETECTED Final   Candida krusei NOT DETECTED NOT DETECTED Final   Candida parapsilosis NOT DETECTED NOT DETECTED Final   Candida tropicalis NOT DETECTED NOT DETECTED Final   Cryptococcus neoformans/gattii NOT DETECTED NOT DETECTED Final    Comment: Performed at [facility], [address], [place], [place] [address]  MRSA Next Gen by PCR, Nasal     Status: None   Collection Time: 10/30/24  2:07 PM   Specimen: Nasal Mucosa; Nasal Swab  Result Value Ref Range Status   MRSA by PCR Next Gen NOT DETECTED NOT  DETECTED Final    Comment: (NOTE) The GeneXpert MRSA Assay (FDA approved for NASAL specimens only), is one component of a comprehensive MRSA colonization surveillance program. It is not intended to diagnose MRSA infection nor to guide or monitor treatment for MRSA infections. Test performance is not FDA approved in patients less than 22 years old. Performed at [facility], [address], [place], [place] [address]   MRSA Next Gen by PCR, Nasal     Status: None   Collection Time: 11/04/24 12:41 PM   Specimen: Nasal Mucosa; Nasal Swab  Result Value Ref Range Status   MRSA by PCR Next Gen NOT DETECTED NOT DETECTED Final    Comment: (NOTE) The GeneXpert MRSA Assay (FDA approved for NASAL specimens only), is one component of a comprehensive MRSA colonization surveillance program. It is not intended to diagnose MRSA infection nor to guide or monitor treatment for MRSA infections. Test performance is not FDA approved in patients less than 56 years old. Performed at [facility], [address] [place], [place] [address]      Scheduled Meds:  amoxicillin -clavulanate  1 tablet Oral Q12H   dextromethorphan -guaiFENesin   1 tablet Oral BID    enoxaparin  (LOVENOX ) injection  40 mg Subcutaneous Q24H   folic acid   1 mg Oral Daily   levETIRAcetam   500 mg Intravenous Q12H   lipase/protease/amylase  36,000 Units Oral TID AC   multivitamin with minerals  1 tablet Oral Daily   phenobarbital   32.4 mg Oral Q8H   polyethylene glycol  17 g Oral BID   senna-docusate  1 tablet Oral BID   sodium chloride  flush  3 mL Intravenous Q12H   sodium chloride  flush  3 mL Intravenous Q12H   thiamine  (VITAMIN B1) injection  100 mg Intravenous Daily   Continuous Infusions:    Procedures/Studies: CT CHEST ABDOMEN PELVIS W CONTRAST Result Date: 10/30/2024 CLINICAL DATA:  Abdominal pain for 1 day, history of pancreatitis EXAM: CT CHEST, ABDOMEN, AND PELVIS WITH CONTRAST TECHNIQUE: Multidetector CT imaging of the chest, abdomen and pelvis was performed following the standard protocol during bolus administration of intravenous contrast. RADIATION DOSE REDUCTION: This exam was performed according to the departmental dose-optimization program which includes automated exposure control, adjustment of the mA and/or kV according to patient size and/or use of iterative reconstruction technique. CONTRAST:  OMNIPAQUE  IOHEXOL  300 MG/ML  SOLN COMPARISON:  06/16/2024 FINDINGS: CT CHEST FINDINGS Cardiovascular: Mild atherosclerotic calcifications of the aorta are noted. No aneurysmal dilatation is seen. Heart is not significantly enlarged in size. Minimal coronary calcifications are noted. Pulmonary artery as visualized is within normal limits although not timed for embolus evaluation. Mediastinum/Nodes: Thoracic inlet is within normal limits. No hilar or mediastinal adenopathy is noted. The esophagus is unremarkable. Lungs/Pleura: The lungs are well aerated bilaterally. Focal consolidation is noted in right lower lobe centrally. Patchy infiltrate is also noted in the inferior aspect of the right middle lobe. These changes are consistent with multifocal infiltrate.  Musculoskeletal: Degenerative changes of the thoracic spine are noted. CT ABDOMEN PELVIS FINDINGS Hepatobiliary: Fatty infiltration of the liver is noted. The gallbladder is unremarkable. Pancreas: Pancreas is well visualized without evidence of pancreatic necrosis the tail appears atrophic. Some peripancreatic inflammatory changes and fluid are noted surrounding the head and uncinate process consistent with focal pancreatitis. Spleen: Normal in size without focal abnormality. Adrenals/Urinary Tract: Adrenal glands are within normal limits. Kidneys demonstrate a normal enhancement pattern. Small 1 cm cyst is noted in the lower  pole of the right kidney. This appears simple in nature and no further follow-up is recommended. No obstructive changes are seen. The bladder is over distended. Stomach/Bowel: Stomach is within normal limits. Appendix appears normal. No evidence of bowel wall thickening, distention, or inflammatory changes. Small bowel and stomach are unremarkable. Vascular/Lymphatic: No significant vascular findings are present. No enlarged abdominal or pelvic lymph nodes. Reproductive: Prostate is unremarkable. Other: No abdominal wall hernia or abnormality. No abdominopelvic ascites. Musculoskeletal: No acute or significant osseous findings. IMPRESSION: CT of the chest: Patchy multifocal infiltrate in the right lung. CT of the abdomen and pelvis: Changes consistent with focal pancreatitis in the head and uncinate process. Electronically Signed   By: Oneil Devonshire M.D.   On: 10/30/2024 00:23   DG Chest Port 1 View Result Date: 10/12/2024 CLINICAL DATA:  Shortness of breath EXAM: PORTABLE CHEST 1 VIEW COMPARISON:  Chest x-ray 08/15/2024 FINDINGS: The heart size and mediastinal contours are within normal limits. Both lungs are clear. There is mild elevation of the left hemidiaphragm. The visualized skeletal structures are unremarkable. IMPRESSION: No active disease. Electronically Signed   By: Greig Pique  M.D.   On: 10/12/2024 21:34    Lebron JINNY Cage, MD  [facility]  If 7PM-7AM, please contact night-coverage [email or web address] 11/04/2024, 8:08 PM   LOS: 5 days   "

## 2024-11-05 DIAGNOSIS — K852 Alcohol induced acute pancreatitis without necrosis or infection: Secondary | ICD-10-CM | POA: Diagnosis not present

## 2024-11-05 LAB — CULTURE, BLOOD (ROUTINE X 2)

## 2024-11-05 LAB — HIV-1 RNA QUANT-NO REFLEX-BLD
HIV 1 RNA Quant: 13900 {copies}/mL
LOG10 HIV-1 RNA: 4.143 {Log_copies}/mL

## 2024-11-05 MED ORDER — THIAMINE MONONITRATE 100 MG PO TABS
100.0000 mg | ORAL_TABLET | Freq: Every day | ORAL | Status: DC
  Administered 2024-11-06 – 2024-11-08 (×3): 100 mg via ORAL
  Filled 2024-11-05: qty 1

## 2024-11-05 MED ORDER — LEVETIRACETAM 500 MG PO TABS
500.0000 mg | ORAL_TABLET | Freq: Two times a day (BID) | ORAL | Status: DC
  Administered 2024-11-05 – 2024-11-08 (×6): 500 mg via ORAL
  Filled 2024-11-05 (×3): qty 1

## 2024-11-05 MED ORDER — HYDROMORPHONE HCL 1 MG/ML IJ SOLN
0.5000 mg | Freq: Four times a day (QID) | INTRAMUSCULAR | Status: DC | PRN
  Administered 2024-11-05 – 2024-11-07 (×7): 0.5 mg via INTRAVENOUS
  Filled 2024-11-05 (×4): qty 0.5

## 2024-11-05 NOTE — TOC Progression Note (Signed)
 Transition of Care [facility]) - Progression Note    Patient Details  Name: Gary [name] MRN: [id number] Date of Birth: 1986-03-30  Transition of Care [facility]) CM/SW Contact  Sonda Manuella Quill, RN Phone Number: 11/05/2024, 5:32 PM  Clinical Narrative:    IP CM acknowledges consult for SA; pt also homeless, and no PCP listed; spoke w/ pt in room; pt requested IP CM follow up w/ him tomorrow; unable to complete assessment.                     Expected Discharge Plan and Services                                               Social Drivers of Health (SDOH) Interventions SDOH Screenings   Food Insecurity: Food Insecurity Present (10/31/2024)  Housing: High Risk (10/31/2024)  Transportation Needs: Unmet Transportation Needs (10/31/2024)  Utilities: Not At Risk (10/31/2024)  Alcohol  Screen: High Risk (04/17/2024)  Depression (PHQ2-9): Medium Risk (01/28/2024)  Financial Resource Strain: Patient Declined (10/11/2023)  Social Connections: Patient Declined (04/18/2024)  Recent Concern: Social Connections - Socially Isolated (02/03/2024)  Stress: No Stress Concern Present (06/30/2022)   Received from [facility]  Recent Concern: Stress - Stress Concern Present (06/25/2022)   Received from [facility]  Tobacco Use: High Risk (10/30/2024)    Readmission Risk Interventions    05/26/2024   11:12 AM 02/14/2024    3:10 PM 12/15/2023   11:23 AM  Readmission Risk Prevention Plan  Transportation Screening Complete Complete Complete  Medication Review Oceanographer) Complete Complete Complete  PCP or Specialist appointment within 3-5 days of discharge Patient refused Patient refused Complete  HRI or Home Care Consult -- Complete Complete  SW Recovery Care/Counseling Consult  Complete Complete  Palliative Care Screening Not Applicable Not Applicable Not Applicable  Skilled Nursing Facility Not Applicable Not Applicable Not Applicable

## 2024-11-05 NOTE — Progress Notes (Signed)
 "          PROGRESS NOTE  Gary [name] FMW:[id number] DOB: 31-Oct-1986 DOA: 10/29/2024 PCP: Pcp, No  Brief History:  38yo with h/o alcohol  abuse, chronic pancreatitis with frequent hospitalizations for acute on chronic pancreatitis, HIV/AIDS, medication noncompliance, seizure disorder, bipolar schizophrenia, PTSD and homelessness who has had frequent recent ER visits who presented on 12/21 with abdominal pain. Continues with active drinking. CT with focal pancreatitis of the head and uncinate process as well as patchy infiltrate in the R lung. He was given Zosyn , IV, and Dilaudid .     Today, patient denied any new complaints, reports being anxious.    Assessment/Plan:  Acute alcoholic pancreatitis Acute on chronic pancreatitis in the setting of continued alcohol  abuse CT abdomen pelvis showing acute pancreatitis of uncinate process and pancreatic head lipase 282 Continue Zofran  as needed Restart Creon , pain management 43 ER visits and 7 admissions in the last 6 months Needs complete abstinence/cessation of alcohol  given his recurrent pancreatitis events, as this is the etiology to his multiple hospitalizations and ED visits  Aspiration pneumonia SIRS Patient meets SIRS criteria with fever to 102.7, tachycardia, tachypnea Chest x-ray showing evidence for aspiration pneumonia In the ED patient received IV Zosyn  -> Unasyn --> p.o. Augmentin  to complete 7 days of antibiotics  Alcohol  withdrawal Patient with continued alcohol  abuse, last drink day of admission, shortly after last dc  Counsel on complete alcohol  cessation/abstinence He has had severe withdrawal on prior hospitalizations Completed phenobarbital  taper Psychiatry consulted, appreciated recs  ??Gram positive bacteremia 12/22 blood culture = staph species on BCID; also has GPR, growing alloiococcus otitis, cornyebacterium pseudodiptheriae. Corynebacterium all possibly contaminant vs related to aspiration pneumonia ID  consulted recommend repeat blood culture if persistent, will evaluate for source control, if negative will confirm contaminant D/C vanc, Unasyn ---> switch to p.o. Augmentin  to complete 7 days of antibiotics Follow blood culture  Hypokalemia/hypomagnesemia Replace as needed  HIV Not currently taking Biktarvy , Bactrim  ID on board, no ART inpatient due to noncompliance Outpatient follow-up with infectious disease CD 4 161/36%  Bipolar disorder (HCC) PTSD (post-traumatic stress disorder) GAD (generalized anxiety disorder) Previously on Seroquel , not currently taking medication Psychiatry consulted He does not currently meet IVC crtieria  Thrombocytopenia Likely associated with bone marrow suppression in the setting of alcohol  use  History of seizure Associated with ETOH withdrawal, last admitted for this issue from 10/29-11/3 Has not been taking Keppra  at home Continue IV Keppra  500 mg twice daily Continue Ativan  as needed for seizure Seizure precautions ordered  Tobacco use disorder Cessation encouraged cessation Nicotine  patch ordered         Consultants: Psychiatry PCCM ID   Procedures: None   Antibiotics: Augmentin          Family Communication:  no Family at bedside   Code Status:  FULL   DVT Prophylaxis:   [place] Lovenox    Procedures: As Listed in Progress Note Above  Antibiotics: Vanc 12.22>> 12/27 Unasyn  12/22>> 12/27 Augmentin  12/27       Objective: Vitals:   11/04/24 1311 11/04/24 2048 11/05/24 0550 11/05/24 1346  BP: 98/72 103/73 96/63 98/67   Pulse: 82 82 70 79  Resp: 16 18 18    Temp: 98.5 F (36.9 C) 98.7 F (37.1 C) 98.2 F (36.8 C) 98.4 F (36.9 C)  TempSrc: Oral  Oral Oral  SpO2: 95% 95% 94% 95%  Weight:      Height:        Intake/Output Summary (Last 24 hours) at 11/05/2024 1653  Last data filed at 11/05/2024 1347 Gross per 24 hour  Intake 960 ml  Output 1265 ml  Net -305 ml   Weight change:  Exam: General: NAD   Cardiovascular: S1, S2 present Respiratory: CTAB Abdomen: Soft, tender, nondistended, bowel sounds present Musculoskeletal: No bilateral pedal edema noted Skin: Normal Psychiatry: Fair mood    Data Reviewed: I have personally reviewed following labs and imaging studies Basic Metabolic Panel: Recent Labs  Lab 10/29/24 2043 10/30/24 0614 11/01/24 0318 11/02/24 0313 11/04/24 0542  NA 141 137 136 133* 136  K 4.0 3.8 3.3* 3.6 3.9  CL 98 98 93* 94* 98  CO2 20* 19* 28 27 26   GLUCOSE 115* 92 74 96 109*  BUN 8 <5* <5* <5* <5*  CREATININE 0.61 0.59* 0.42* 0.31* 0.47*  CALCIUM  9.3 8.6* 9.4 8.7* 9.5  MG  --   --  1.5* 1.8  --    Liver Function Tests: Recent Labs  Lab 10/29/24 2043 10/30/24 0614 11/01/24 0318  AST 48* 34 38  ALT 44 29 30  ALKPHOS 138* 109 106  BILITOT 0.4 0.5 0.4  PROT 8.1 6.5 6.7  ALBUMIN 4.4 3.5 3.4*   Recent Labs  Lab 10/29/24 2043  LIPASE 282*   No results for input(s): AMMONIA in the last 168 hours. Coagulation Profile: No results for input(s): INR, PROTIME in the last 168 hours. CBC: Recent Labs  Lab 10/29/24 2043 10/30/24 0801 11/01/24 0318 11/02/24 0313 11/04/24 0542  WBC 12.2* 8.1 6.1 4.4 3.5*  NEUTROABS  --   --  4.6  --   --   HGB 13.8 10.4* 11.1* 10.4* 10.6*  HCT 42.0 31.6* 33.6* 31.4* 31.6*  MCV 93.8 92.7 92.3 91.3 91.9  PLT 136* 84* 85* 90* 160   Cardiac Enzymes: No results for input(s): CKTOTAL, CKMB, CKMBINDEX, TROPONINI in the last 168 hours. BNP: Invalid input(s): POCBNP CBG: No results for input(s): GLUCAP in the last 168 hours. HbA1C: No results for input(s): HGBA1C in the last 72 hours. Urine analysis:    Component Value Date/Time   COLORURINE YELLOW 10/30/2024 0149   APPEARANCEUR CLEAR 10/30/2024 0149   APPEARANCEUR Clear 11/15/2014 1755   LABSPEC 1.037 (H) 10/30/2024 0149   LABSPEC 1.002 11/15/2014 1755   PHURINE 6.0 10/30/2024 0149   GLUCOSEU NEGATIVE 10/30/2024 0149   GLUCOSEU  Negative 11/15/2014 1755   HGBUR SMALL (A) 10/30/2024 0149   BILIRUBINUR NEGATIVE 10/30/2024 0149   BILIRUBINUR Negative 11/15/2014 1755   KETONESUR 80 (A) 10/30/2024 0149   PROTEINUR >=300 (A) 10/30/2024 0149   NITRITE NEGATIVE 10/30/2024 0149   LEUKOCYTESUR NEGATIVE 10/30/2024 0149   LEUKOCYTESUR Negative 11/15/2014 1755   Sepsis Labs: @LABRCNTIP (procalcitonin:4,lacticidven:4) ) Recent Results (from the past 240 hours)  Resp panel by RT-PCR (RSV, Flu A&B, Covid) Anterior Nasal Swab     Status: None   Collection Time: 10/29/24  8:43 PM   Specimen: Anterior Nasal Swab  Result Value Ref Range Status   SARS Coronavirus 2 by RT PCR NEGATIVE NEGATIVE Final    Comment: (NOTE) SARS-CoV-2 target nucleic acids are NOT DETECTED.  The SARS-CoV-2 RNA is generally detectable in upper respiratory specimens during the acute phase of infection. The lowest concentration of SARS-CoV-2 viral copies this assay can detect is 138 copies/mL. A negative result does not preclude SARS-Cov-2 infection and should not be used as the sole basis for treatment or other patient management decisions. A negative result may occur with  improper specimen collection/handling, submission of specimen other than nasopharyngeal swab,  presence of viral mutation(s) within the areas targeted by this assay, and inadequate number of viral copies(<138 copies/mL). A negative result must be combined with clinical observations, patient history, and epidemiological information. The expected result is Negative.  Fact Sheet for Patients:  bloggercourse.com  Fact Sheet for Healthcare Providers:  seriousbroker.it  This test is no t yet approved or cleared by the [place]  FDA and  has been authorized for detection and/or diagnosis of SARS-CoV-2 by FDA under an Emergency Use Authorization (EUA). This EUA will remain  in effect (meaning this test can be used) for the duration  of the COVID-19 declaration under Section 564(b)(1) of the Act, 21 U.S.C.section 360bbb-3(b)(1), unless the authorization is terminated  or revoked sooner.       Influenza A by PCR NEGATIVE NEGATIVE Final   Influenza B by PCR NEGATIVE NEGATIVE Final    Comment: (NOTE) The Xpert Xpress SARS-CoV-2/FLU/RSV plus assay is intended as an aid in the diagnosis of influenza from Nasopharyngeal swab specimens and should not be used as a sole basis for treatment. Nasal washings and aspirates are unacceptable for Xpert Xpress SARS-CoV-2/FLU/RSV testing.  Fact Sheet for Patients: bloggercourse.com  Fact Sheet for Healthcare Providers: seriousbroker.it  This test is not yet approved or cleared by the [place]  FDA and has been authorized for detection and/or diagnosis of SARS-CoV-2 by FDA under an Emergency Use Authorization (EUA). This EUA will remain in effect (meaning this test can be used) for the duration of the COVID-19 declaration under Section 564(b)(1) of the Act, 21 U.S.C. section 360bbb-3(b)(1), unless the authorization is terminated or revoked.     Resp Syncytial Virus by PCR NEGATIVE NEGATIVE Final    Comment: (NOTE) Fact Sheet for Patients: bloggercourse.com  Fact Sheet for Healthcare Providers: seriousbroker.it  This test is not yet approved or cleared by the [place]  FDA and has been authorized for detection and/or diagnosis of SARS-CoV-2 by FDA under an Emergency Use Authorization (EUA). This EUA will remain in effect (meaning this test can be used) for the duration of the COVID-19 declaration under Section 564(b)(1) of the Act, 21 U.S.C. section 360bbb-3(b)(1), unless the authorization is terminated or revoked.  Performed at [facility], [address] [place], [place] [address]   Blood culture (routine x 2)     Status: Abnormal    Collection Time: 10/30/24  1:49 AM   Specimen: BLOOD  Result Value Ref Range Status   Specimen Description   Final    BLOOD RIGHT ANTECUBITAL Performed at [facility], [address], [place], [place] [address]    Special Requests   Final    BOTTLES DRAWN AEROBIC AND ANAEROBIC Blood Culture results may not be optimal due to an inadequate volume of blood received in culture bottles Performed at [facility], [address], [place], [place] [address]    Culture  Setup Time   Final    GRAM POSITIVE COCCI ANAEROBIC BOTTLE ONLY CRITICAL RESULT CALLED TO, READ BACK BY AND VERIFIED WITH:   MITCHELL LILLISTON PHARMD 10/31/2024 @ 0429 BY DD GRAM POSITIVE RODS AEROBIC BOTTLE ONLY RBV CHRISTINE SHADE PHARM D 10/31/2024 @0738  BY DD    Culture (A)  Final    ALLOIOCOCCUS OTITIS CORYNEBACTERIUM PSEUDODIPHTHERIAE Standardized susceptibility testing for this organism is not available. Performed at [facility], [address] [place], [place] [address]    Report Status 11/03/2024 FINAL  Final  Blood culture (routine x 2)  Status: Abnormal   Collection Time: 10/30/24  1:49 AM   Specimen: BLOOD LEFT HAND  Result Value Ref Range Status   Specimen Description   Final    BLOOD LEFT HAND Performed at [facility], [address], [place], [place] [address]    Special Requests   Final    BOTTLES DRAWN AEROBIC AND ANAEROBIC Blood Culture results may not be optimal due to an inadequate volume of blood received in culture bottles Performed at [facility], [address], [place], [place] [address]    Culture  Setup Time   Final    GRAM POSITIVE COCCI ANAEROBIC BOTTLE ONLY CRITICAL VALUE NOTED.  VALUE IS CONSISTENT WITH PREVIOUSLY REPORTED AND CALLED VALUE. GRAM POSITIVE RODS AEROBIC BOTTLE ONLY CRITICAL RESULT CALLED TO, READ BACK BY AND VERIFIED WITH: PHARMD NICK.G AT 0630 ON 10/31/2024 BY T.SAAD.    Culture (A)  Final     ALLOIOCOCCUS OTITIS Standardized susceptibility testing for this organism is not available. GRAM POSITIVE RODS CALL MICROBIOLOGY LAB IF SENSITIVITIES ARE REQUIRED. Performed at [facility], [address] [place], [place] [address]    Report Status 11/05/2024 FINAL  Final  Blood Culture ID Panel (Reflexed)     Status: None   Collection Time: 10/30/24  1:49 AM  Result Value Ref Range Status   Enterococcus faecalis NOT DETECTED NOT DETECTED Final   Enterococcus Faecium NOT DETECTED NOT DETECTED Final   Listeria monocytogenes NOT DETECTED NOT DETECTED Final   Staphylococcus species NOT DETECTED NOT DETECTED Final   Staphylococcus aureus (BCID) NOT DETECTED NOT DETECTED Final   Staphylococcus epidermidis NOT DETECTED NOT DETECTED Final   Staphylococcus lugdunensis NOT DETECTED NOT DETECTED Final   Streptococcus species NOT DETECTED NOT DETECTED Final   Streptococcus agalactiae NOT DETECTED NOT DETECTED Final   Streptococcus pneumoniae NOT DETECTED NOT DETECTED Final   Streptococcus pyogenes NOT DETECTED NOT DETECTED Final   A.calcoaceticus-baumannii NOT DETECTED NOT DETECTED Final   Bacteroides fragilis NOT DETECTED NOT DETECTED Final   Enterobacterales NOT DETECTED NOT DETECTED Final   Enterobacter cloacae complex NOT DETECTED NOT DETECTED Final   Escherichia coli NOT DETECTED NOT DETECTED Final   Klebsiella aerogenes NOT DETECTED NOT DETECTED Final   Klebsiella oxytoca NOT DETECTED NOT DETECTED Final   Klebsiella pneumoniae NOT DETECTED NOT DETECTED Final   Proteus species NOT DETECTED NOT DETECTED Final   Salmonella species NOT DETECTED NOT DETECTED Final   Serratia marcescens NOT DETECTED NOT DETECTED Final   Haemophilus influenzae NOT DETECTED NOT DETECTED Final   Neisseria meningitidis NOT DETECTED NOT DETECTED Final   Pseudomonas aeruginosa NOT DETECTED NOT DETECTED Final   Stenotrophomonas maltophilia NOT DETECTED NOT DETECTED Final   Candida albicans NOT DETECTED NOT  DETECTED Final   Candida auris NOT DETECTED NOT DETECTED Final   Candida glabrata NOT DETECTED NOT DETECTED Final   Candida krusei NOT DETECTED NOT DETECTED Final   Candida parapsilosis NOT DETECTED NOT DETECTED Final   Candida tropicalis NOT DETECTED NOT DETECTED Final   Cryptococcus neoformans/gattii NOT DETECTED NOT DETECTED Final    Comment: Performed at [facility], [address] [place], [place] [address]  MRSA Next Gen by PCR, Nasal     Status: None   Collection Time: 10/30/24  2:07 PM   Specimen: Nasal Mucosa; Nasal Swab  Result Value Ref Range Status   MRSA by PCR Next Gen NOT DETECTED NOT DETECTED Final    Comment: (NOTE) The GeneXpert MRSA  Assay (FDA approved for NASAL specimens only), is one component of a comprehensive MRSA colonization surveillance program. It is not intended to diagnose MRSA infection nor to guide or monitor treatment for MRSA infections. Test performance is not FDA approved in patients less than 20 years old. Performed at [facility], [address] [place], [place] [address]   MRSA Next Gen by PCR, Nasal     Status: None   Collection Time: 11/04/24 12:41 PM   Specimen: Nasal Mucosa; Nasal Swab  Result Value Ref Range Status   MRSA by PCR Next Gen NOT DETECTED NOT DETECTED Final    Comment: (NOTE) The GeneXpert MRSA Assay (FDA approved for NASAL specimens only), is one component of a comprehensive MRSA colonization surveillance program. It is not intended to diagnose MRSA infection nor to guide or monitor treatment for MRSA infections. Test performance is not FDA approved in patients less than 27 years old. Performed at [facility], [address] [place], [place] [address]   Culture, blood (Routine X 2) w Reflex to ID Panel     Status: None (Preliminary result)   Collection Time: 11/04/24  2:20 PM   Specimen: BLOOD  Result Value Ref Range Status   Specimen Description   Final    BLOOD BLOOD  LEFT HAND Performed at [facility], [address], [place], [place] [address]    Special Requests   Final    BOTTLES DRAWN AEROBIC AND ANAEROBIC Blood Culture results may not be optimal due to an inadequate volume of blood received in culture bottles Performed at [facility], [address] [place], [place] [address]    Culture   Final    NO GROWTH < 24 HOURS Performed at [facility], [address], [place], [place] [address]    Report Status PENDING  Incomplete  Culture, blood (Routine X 2) w Reflex to ID Panel     Status: None (Preliminary result)   Collection Time: 11/04/24  2:29 PM   Specimen: BLOOD  Result Value Ref Range Status   Specimen Description   Final    BLOOD BLOOD RIGHT HAND Performed at [facility], [address], [place], [place] [address]    Special Requests   Final    BOTTLES DRAWN AEROBIC AND ANAEROBIC Blood Culture adequate volume Performed at [facility], [address], [place], [place] [address]    Culture   Final    NO GROWTH < 24 HOURS Performed at [facility], [address] [place], [place] [address]    Report Status PENDING  Incomplete     Scheduled Meds:  amoxicillin -clavulanate  1 tablet Oral Q12H   dextromethorphan -guaiFENesin   1 tablet Oral BID   enoxaparin  (LOVENOX ) injection  40 mg Subcutaneous Q24H   folic acid   1 mg Oral Daily   levETIRAcetam   500 mg Oral BID   lipase/protease/amylase  36,000 Units Oral TID AC   multivitamin with minerals  1 tablet Oral Daily   polyethylene glycol  17 g Oral BID   senna-docusate  1 tablet Oral BID   sodium chloride  flush  3 mL Intravenous Q12H   sodium chloride  flush  3 mL Intravenous Q12H   [START ON 11/06/2024] thiamine   100 mg Oral Daily   Continuous Infusions:    Procedures/Studies: CT CHEST ABDOMEN PELVIS W CONTRAST Result Date: 10/30/2024 CLINICAL DATA:  Abdominal pain for 1 day, history of  pancreatitis  EXAM: CT CHEST, ABDOMEN, AND PELVIS WITH CONTRAST TECHNIQUE: Multidetector CT imaging of the chest, abdomen and pelvis was performed following the standard protocol during bolus administration of intravenous contrast. RADIATION DOSE REDUCTION: This exam was performed according to the departmental dose-optimization program which includes automated exposure control, adjustment of the mA and/or kV according to patient size and/or use of iterative reconstruction technique. CONTRAST:  OMNIPAQUE  IOHEXOL  300 MG/ML  SOLN COMPARISON:  06/16/2024 FINDINGS: CT CHEST FINDINGS Cardiovascular: Mild atherosclerotic calcifications of the aorta are noted. No aneurysmal dilatation is seen. Heart is not significantly enlarged in size. Minimal coronary calcifications are noted. Pulmonary artery as visualized is within normal limits although not timed for embolus evaluation. Mediastinum/Nodes: Thoracic inlet is within normal limits. No hilar or mediastinal adenopathy is noted. The esophagus is unremarkable. Lungs/Pleura: The lungs are well aerated bilaterally. Focal consolidation is noted in right lower lobe centrally. Patchy infiltrate is also noted in the inferior aspect of the right middle lobe. These changes are consistent with multifocal infiltrate. Musculoskeletal: Degenerative changes of the thoracic spine are noted. CT ABDOMEN PELVIS FINDINGS Hepatobiliary: Fatty infiltration of the liver is noted. The gallbladder is unremarkable. Pancreas: Pancreas is well visualized without evidence of pancreatic necrosis the tail appears atrophic. Some peripancreatic inflammatory changes and fluid are noted surrounding the head and uncinate process consistent with focal pancreatitis. Spleen: Normal in size without focal abnormality. Adrenals/Urinary Tract: Adrenal glands are within normal limits. Kidneys demonstrate a normal enhancement pattern. Small 1 cm cyst is noted in the lower pole of the right kidney. This appears  simple in nature and no further follow-up is recommended. No obstructive changes are seen. The bladder is over distended. Stomach/Bowel: Stomach is within normal limits. Appendix appears normal. No evidence of bowel wall thickening, distention, or inflammatory changes. Small bowel and stomach are unremarkable. Vascular/Lymphatic: No significant vascular findings are present. No enlarged abdominal or pelvic lymph nodes. Reproductive: Prostate is unremarkable. Other: No abdominal wall hernia or abnormality. No abdominopelvic ascites. Musculoskeletal: No acute or significant osseous findings. IMPRESSION: CT of the chest: Patchy multifocal infiltrate in the right lung. CT of the abdomen and pelvis: Changes consistent with focal pancreatitis in the head and uncinate process. Electronically Signed   By: Oneil Devonshire M.D.   On: 10/30/2024 00:23   DG Chest Port 1 View Result Date: 10/12/2024 CLINICAL DATA:  Shortness of breath EXAM: PORTABLE CHEST 1 VIEW COMPARISON:  Chest x-ray 08/15/2024 FINDINGS: The heart size and mediastinal contours are within normal limits. Both lungs are clear. There is mild elevation of the left hemidiaphragm. The visualized skeletal structures are unremarkable. IMPRESSION: No active disease. Electronically Signed   By: Greig Pique M.D.   On: 10/12/2024 21:34    Lebron JINNY Cage, MD  [facility]  If 7PM-7AM, please contact night-coverage [email or web address] 11/05/2024, 4:53 PM   LOS: 6 days   "

## 2024-11-06 DIAGNOSIS — K852 Alcohol induced acute pancreatitis without necrosis or infection: Secondary | ICD-10-CM | POA: Diagnosis not present

## 2024-11-06 MED ORDER — ALUM & MAG HYDROXIDE-SIMETH 200-200-20 MG/5ML PO SUSP
15.0000 mL | ORAL | Status: DC | PRN
  Administered 2024-11-06: 15 mL via ORAL
  Filled 2024-11-06: qty 30

## 2024-11-06 NOTE — Progress Notes (Signed)
 "          PROGRESS NOTE  Gary [name] FMW:[id number] DOB: 1986-08-12 DOA: 10/29/2024 PCP: Pcp, No  Brief History:  38yo with h/o alcohol  abuse, chronic pancreatitis with frequent hospitalizations for acute on chronic pancreatitis, HIV/AIDS, medication noncompliance, seizure disorder, bipolar schizophrenia, PTSD and homelessness who has had frequent recent ER visits who presented on 12/21 with abdominal pain. Continues with active drinking. CT with focal pancreatitis of the head and uncinate process as well as patchy infiltrate in the R lung. He was given Zosyn , IV, and Dilaudid .     Today, continues to ask for pain med    Assessment/Plan:  Acute alcoholic pancreatitis Acute on chronic pancreatitis in the setting of continued alcohol  abuse CT abdomen pelvis showing acute pancreatitis of uncinate process and pancreatic head lipase 282 Continue Zofran  as needed Restart Creon , pain management 43 ER visits and 7 admissions in the last 6 months Needs complete abstinence/cessation of alcohol  given his recurrent pancreatitis events, as this is the etiology to his multiple hospitalizations and ED visits  Aspiration pneumonia SIRS Patient meets SIRS criteria with fever to 102.7, tachycardia, tachypnea Chest x-ray showing evidence for aspiration pneumonia In the ED patient received IV Zosyn  -> Unasyn --> completed p.o. Augmentin  X 7 days of antibiotics  Alcohol  withdrawal Patient with continued alcohol  abuse, last drink day of admission, shortly after last dc  Counsel on complete alcohol  cessation/abstinence He has had severe withdrawal on prior hospitalizations Completed phenobarbital  taper Psychiatry consulted, appreciated recs  ??Gram positive bacteremia 12/22 blood culture = staph species on BCID; also has GPR, growing alloiococcus otitis, cornyebacterium pseudodiptheriae. Corynebacterium all possibly contaminant vs related to aspiration pneumonia Repeat BC X 2 NGTD ID consulted  recommend repeat blood culture if persistent, will evaluate for source control, if negative will confirm contaminant D/C vanc, Unasyn ---> switched to po Augmentin  and complete 7 days of antibiotics  Hypokalemia/hypomagnesemia Replace as needed  HIV Not currently taking Biktarvy , Bactrim  ID on board, no ART inpatient due to noncompliance Outpatient follow-up with infectious disease CD 4 161/36%  Bipolar disorder (HCC) PTSD (post-traumatic stress disorder) GAD (generalized anxiety disorder) Previously on Seroquel , not currently taking medication Psychiatry consulted He does not currently meet IVC crtieria  Thrombocytopenia Likely associated with bone marrow suppression in the setting of alcohol  use  History of seizure Associated with ETOH withdrawal, last admitted for this issue from 10/29-11/3 Has not been taking Keppra  at home Continue IV Keppra  500 mg twice daily Continue Ativan  as needed for seizure Seizure precautions ordered  Tobacco use disorder Cessation encouraged cessation Nicotine  patch ordered         Consultants: Psychiatry PCCM ID   Procedures: None   Antibiotics: Augmentin          Family Communication:  no Family at bedside   Code Status:  FULL   DVT Prophylaxis:   [place] Lovenox    Procedures: As Listed in Progress Note Above  Antibiotics: Vanc 12.22>> 12/27 Unasyn  12/22>> 12/27 Augmentin  12/27       Objective: Vitals:   11/05/24 1346 11/05/24 2033 11/06/24 0426 11/06/24 1330  BP: 98/67 98/71 99/67  98/72  Pulse: 79 69 68 71  Resp:  16 18 18   Temp: 98.4 F (36.9 C) 98.2 F (36.8 C)  98.4 F (36.9 C)  TempSrc: Oral Oral  Oral  SpO2: 95% 95% 94% 95%  Weight:      Height:        Intake/Output Summary (Last 24 hours) at 11/06/2024 1836 Last data filed  at 11/06/2024 1715 Gross per 24 hour  Intake 366 ml  Output 1750 ml  Net -1384 ml   Weight change:  Exam: General: NAD  Cardiovascular: S1, S2 present Respiratory:  CTAB Abdomen: Soft, tender, nondistended, bowel sounds present Musculoskeletal: No bilateral pedal edema noted Skin: Normal Psychiatry: Fair mood    Data Reviewed: I have personally reviewed following labs and imaging studies Basic Metabolic Panel: Recent Labs  Lab 11/01/24 0318 11/02/24 0313 11/04/24 0542  NA 136 133* 136  K 3.3* 3.6 3.9  CL 93* 94* 98  CO2 28 27 26   GLUCOSE 74 96 109*  BUN <5* <5* <5*  CREATININE 0.42* 0.31* 0.47*  CALCIUM  9.4 8.7* 9.5  MG 1.5* 1.8  --    Liver Function Tests: Recent Labs  Lab 11/01/24 0318  AST 38  ALT 30  ALKPHOS 106  BILITOT 0.4  PROT 6.7  ALBUMIN 3.4*   No results for input(s): LIPASE, AMYLASE in the last 168 hours.  No results for input(s): AMMONIA in the last 168 hours. Coagulation Profile: No results for input(s): INR, PROTIME in the last 168 hours. CBC: Recent Labs  Lab 11/01/24 0318 11/02/24 0313 11/04/24 0542  WBC 6.1 4.4 3.5*  NEUTROABS 4.6  --   --   HGB 11.1* 10.4* 10.6*  HCT 33.6* 31.4* 31.6*  MCV 92.3 91.3 91.9  PLT 85* 90* 160   Cardiac Enzymes: No results for input(s): CKTOTAL, CKMB, CKMBINDEX, TROPONINI in the last 168 hours. BNP: Invalid input(s): POCBNP CBG: No results for input(s): GLUCAP in the last 168 hours. HbA1C: No results for input(s): HGBA1C in the last 72 hours. Urine analysis:    Component Value Date/Time   COLORURINE YELLOW 10/30/2024 0149   APPEARANCEUR CLEAR 10/30/2024 0149   APPEARANCEUR Clear 11/15/2014 1755   LABSPEC 1.037 (H) 10/30/2024 0149   LABSPEC 1.002 11/15/2014 1755   PHURINE 6.0 10/30/2024 0149   GLUCOSEU NEGATIVE 10/30/2024 0149   GLUCOSEU Negative 11/15/2014 1755   HGBUR SMALL (A) 10/30/2024 0149   BILIRUBINUR NEGATIVE 10/30/2024 0149   BILIRUBINUR Negative 11/15/2014 1755   KETONESUR 80 (A) 10/30/2024 0149   PROTEINUR >=300 (A) 10/30/2024 0149   NITRITE NEGATIVE 10/30/2024 0149   LEUKOCYTESUR NEGATIVE 10/30/2024 0149    LEUKOCYTESUR Negative 11/15/2014 1755   Sepsis Labs: @LABRCNTIP (procalcitonin:4,lacticidven:4) ) Recent Results (from the past 240 hours)  Resp panel by RT-PCR (RSV, Flu A&B, Covid) Anterior Nasal Swab     Status: None   Collection Time: 10/29/24  8:43 PM   Specimen: Anterior Nasal Swab  Result Value Ref Range Status   SARS Coronavirus 2 by RT PCR NEGATIVE NEGATIVE Final    Comment: (NOTE) SARS-CoV-2 target nucleic acids are NOT DETECTED.  The SARS-CoV-2 RNA is generally detectable in upper respiratory specimens during the acute phase of infection. The lowest concentration of SARS-CoV-2 viral copies this assay can detect is 138 copies/mL. A negative result does not preclude SARS-Cov-2 infection and should not be used as the sole basis for treatment or other patient management decisions. A negative result may occur with  improper specimen collection/handling, submission of specimen other than nasopharyngeal swab, presence of viral mutation(s) within the areas targeted by this assay, and inadequate number of viral copies(<138 copies/mL). A negative result must be combined with clinical observations, patient history, and epidemiological information. The expected result is Negative.  Fact Sheet for Patients:  bloggercourse.com  Fact Sheet for Healthcare Providers:  seriousbroker.it  This test is no t yet approved or cleared by the  [place]  FDA and  has been authorized for detection and/or diagnosis of SARS-CoV-2 by FDA under an Emergency Use Authorization (EUA). This EUA will remain  in effect (meaning this test can be used) for the duration of the COVID-19 declaration under Section 564(b)(1) of the Act, 21 U.S.C.section 360bbb-3(b)(1), unless the authorization is terminated  or revoked sooner.       Influenza A by PCR NEGATIVE NEGATIVE Final   Influenza B by PCR NEGATIVE NEGATIVE Final    Comment: (NOTE) The Xpert Xpress  SARS-CoV-2/FLU/RSV plus assay is intended as an aid in the diagnosis of influenza from Nasopharyngeal swab specimens and should not be used as a sole basis for treatment. Nasal washings and aspirates are unacceptable for Xpert Xpress SARS-CoV-2/FLU/RSV testing.  Fact Sheet for Patients: bloggercourse.com  Fact Sheet for Healthcare Providers: seriousbroker.it  This test is not yet approved or cleared by the [place]  FDA and has been authorized for detection and/or diagnosis of SARS-CoV-2 by FDA under an Emergency Use Authorization (EUA). This EUA will remain in effect (meaning this test can be used) for the duration of the COVID-19 declaration under Section 564(b)(1) of the Act, 21 U.S.C. section 360bbb-3(b)(1), unless the authorization is terminated or revoked.     Resp Syncytial Virus by PCR NEGATIVE NEGATIVE Final    Comment: (NOTE) Fact Sheet for Patients: bloggercourse.com  Fact Sheet for Healthcare Providers: seriousbroker.it  This test is not yet approved or cleared by the [place]  FDA and has been authorized for detection and/or diagnosis of SARS-CoV-2 by FDA under an Emergency Use Authorization (EUA). This EUA will remain in effect (meaning this test can be used) for the duration of the COVID-19 declaration under Section 564(b)(1) of the Act, 21 U.S.C. section 360bbb-3(b)(1), unless the authorization is terminated or revoked.  Performed at [facility], [address], [place], [place] [address]   Blood culture (routine x 2)     Status: Abnormal   Collection Time: 10/30/24  1:49 AM   Specimen: BLOOD  Result Value Ref Range Status   Specimen Description   Final    BLOOD RIGHT ANTECUBITAL Performed at [facility], [address] [place], [place] [address]    Special Requests   Final    BOTTLES DRAWN AEROBIC AND ANAEROBIC Blood  Culture results may not be optimal due to an inadequate volume of blood received in culture bottles Performed at [facility], [address], [place], [place] [address]    Culture  Setup Time   Final    GRAM POSITIVE COCCI ANAEROBIC BOTTLE ONLY CRITICAL RESULT CALLED TO, READ BACK BY AND VERIFIED WITH:   MITCHELL LILLISTON PHARMD 10/31/2024 @ 0429 BY DD GRAM POSITIVE RODS AEROBIC BOTTLE ONLY RBV CHRISTINE SHADE PHARM D 10/31/2024 @0738  BY DD    Culture (A)  Final    ALLOIOCOCCUS OTITIS CORYNEBACTERIUM PSEUDODIPHTHERIAE Standardized susceptibility testing for this organism is not available. Performed at [facility], [address], [place], [place] [address]    Report Status 11/03/2024 FINAL  Final  Blood culture (routine x 2)     Status: Abnormal   Collection Time: 10/30/24  1:49 AM   Specimen: BLOOD LEFT HAND  Result Value Ref Range Status   Specimen Description   Final    BLOOD LEFT HAND Performed at [facility], [address], [place], [place] [address]    Special Requests   Final    BOTTLES DRAWN AEROBIC AND ANAEROBIC  Blood Culture results may not be optimal due to an inadequate volume of blood received in culture bottles Performed at [facility], [address] [place], [place] [address]    Culture  Setup Time   Final    GRAM POSITIVE COCCI ANAEROBIC BOTTLE ONLY CRITICAL VALUE NOTED.  VALUE IS CONSISTENT WITH PREVIOUSLY REPORTED AND CALLED VALUE. GRAM POSITIVE RODS AEROBIC BOTTLE ONLY CRITICAL RESULT CALLED TO, READ BACK BY AND VERIFIED WITH: PHARMD NICK.G AT 0630 ON 10/31/2024 BY T.SAAD.    Culture (A)  Final    ALLOIOCOCCUS OTITIS Standardized susceptibility testing for this organism is not available. GRAM POSITIVE RODS CALL MICROBIOLOGY LAB IF SENSITIVITIES ARE REQUIRED. Performed at [facility], [address], [place], [place] [address]    Report Status 11/05/2024 FINAL  Final  Blood Culture ID  Panel (Reflexed)     Status: None   Collection Time: 10/30/24  1:49 AM  Result Value Ref Range Status   Enterococcus faecalis NOT DETECTED NOT DETECTED Final   Enterococcus Faecium NOT DETECTED NOT DETECTED Final   Listeria monocytogenes NOT DETECTED NOT DETECTED Final   Staphylococcus species NOT DETECTED NOT DETECTED Final   Staphylococcus aureus (BCID) NOT DETECTED NOT DETECTED Final   Staphylococcus epidermidis NOT DETECTED NOT DETECTED Final   Staphylococcus lugdunensis NOT DETECTED NOT DETECTED Final   Streptococcus species NOT DETECTED NOT DETECTED Final   Streptococcus agalactiae NOT DETECTED NOT DETECTED Final   Streptococcus pneumoniae NOT DETECTED NOT DETECTED Final   Streptococcus pyogenes NOT DETECTED NOT DETECTED Final   A.calcoaceticus-baumannii NOT DETECTED NOT DETECTED Final   Bacteroides fragilis NOT DETECTED NOT DETECTED Final   Enterobacterales NOT DETECTED NOT DETECTED Final   Enterobacter cloacae complex NOT DETECTED NOT DETECTED Final   Escherichia coli NOT DETECTED NOT DETECTED Final   Klebsiella aerogenes NOT DETECTED NOT DETECTED Final   Klebsiella oxytoca NOT DETECTED NOT DETECTED Final   Klebsiella pneumoniae NOT DETECTED NOT DETECTED Final   Proteus species NOT DETECTED NOT DETECTED Final   Salmonella species NOT DETECTED NOT DETECTED Final   Serratia marcescens NOT DETECTED NOT DETECTED Final   Haemophilus influenzae NOT DETECTED NOT DETECTED Final   Neisseria meningitidis NOT DETECTED NOT DETECTED Final   Pseudomonas aeruginosa NOT DETECTED NOT DETECTED Final   Stenotrophomonas maltophilia NOT DETECTED NOT DETECTED Final   Candida albicans NOT DETECTED NOT DETECTED Final   Candida auris NOT DETECTED NOT DETECTED Final   Candida glabrata NOT DETECTED NOT DETECTED Final   Candida krusei NOT DETECTED NOT DETECTED Final   Candida parapsilosis NOT DETECTED NOT DETECTED Final   Candida tropicalis NOT DETECTED NOT DETECTED Final   Cryptococcus  neoformans/gattii NOT DETECTED NOT DETECTED Final    Comment: Performed at [facility], [address], [place], [place] [address]  MRSA Next Gen by PCR, Nasal     Status: None   Collection Time: 10/30/24  2:07 PM   Specimen: Nasal Mucosa; Nasal Swab  Result Value Ref Range Status   MRSA by PCR Next Gen NOT DETECTED NOT DETECTED Final    Comment: (NOTE) The GeneXpert MRSA Assay (FDA approved for NASAL specimens only), is one component of a comprehensive MRSA colonization surveillance program. It is not intended to diagnose MRSA infection nor to guide or monitor treatment for MRSA infections. Test performance is not FDA approved in patients less than 53 years old. Performed at [facility], [address] [place], [place] [address]   MRSA Next  Gen by PCR, Nasal     Status: None   Collection Time: 11/04/24 12:41 PM   Specimen: Nasal Mucosa; Nasal Swab  Result Value Ref Range Status   MRSA by PCR Next Gen NOT DETECTED NOT DETECTED Final    Comment: (NOTE) The GeneXpert MRSA Assay (FDA approved for NASAL specimens only), is one component of a comprehensive MRSA colonization surveillance program. It is not intended to diagnose MRSA infection nor to guide or monitor treatment for MRSA infections. Test performance is not FDA approved in patients less than 67 years old. Performed at [facility], [address] [place], [place] [address]   Culture, blood (Routine X 2) w Reflex to ID Panel     Status: None (Preliminary result)   Collection Time: 11/04/24  2:20 PM   Specimen: BLOOD  Result Value Ref Range Status   Specimen Description   Final    BLOOD BLOOD LEFT HAND Performed at [facility], [address], [place], [place] [address]    Special Requests   Final    BOTTLES DRAWN AEROBIC AND ANAEROBIC Blood Culture results may not be optimal due to an inadequate volume of blood received in culture bottles Performed at  [facility], [address] [place], [place] [address]    Culture   Final    NO GROWTH 2 DAYS Performed at [facility], [address], [place], [place] [address]    Report Status PENDING  Incomplete  Culture, blood (Routine X 2) w Reflex to ID Panel     Status: None (Preliminary result)   Collection Time: 11/04/24  2:29 PM   Specimen: BLOOD  Result Value Ref Range Status   Specimen Description   Final    BLOOD BLOOD RIGHT HAND Performed at [facility], [address], [place], [place] [address]    Special Requests   Final    BOTTLES DRAWN AEROBIC AND ANAEROBIC Blood Culture adequate volume Performed at [facility], [address] [place], [place] [address]    Culture   Final    NO GROWTH 2 DAYS Performed at [facility], [address], [place], [place] [address]    Report Status PENDING  Incomplete     Scheduled Meds:  dextromethorphan -guaiFENesin   1 tablet Oral BID   enoxaparin  (LOVENOX ) injection  40 mg Subcutaneous Q24H   folic acid   1 mg Oral Daily   levETIRAcetam   500 mg Oral BID   lipase/protease/amylase  36,000 Units Oral TID AC   multivitamin with minerals  1 tablet Oral Daily   polyethylene glycol  17 g Oral BID   senna-docusate  1 tablet Oral BID   sodium chloride  flush  3 mL Intravenous Q12H   sodium chloride  flush  3 mL Intravenous Q12H   thiamine   100 mg Oral Daily   Continuous Infusions:    Procedures/Studies: CT CHEST ABDOMEN PELVIS W CONTRAST Result Date: 10/30/2024 CLINICAL DATA:  Abdominal pain for 1 day, history of pancreatitis EXAM: CT CHEST, ABDOMEN, AND PELVIS WITH CONTRAST TECHNIQUE: Multidetector CT imaging of the chest, abdomen and pelvis was performed following the standard protocol during bolus administration of intravenous contrast. RADIATION DOSE REDUCTION: This exam was performed according to the departmental dose-optimization program which includes automated  exposure control, adjustment of the mA and/or kV according to patient size and/or use of iterative reconstruction technique. CONTRAST:  OMNIPAQUE  IOHEXOL  300 MG/ML  SOLN COMPARISON:  06/16/2024 FINDINGS:  CT CHEST FINDINGS Cardiovascular: Mild atherosclerotic calcifications of the aorta are noted. No aneurysmal dilatation is seen. Heart is not significantly enlarged in size. Minimal coronary calcifications are noted. Pulmonary artery as visualized is within normal limits although not timed for embolus evaluation. Mediastinum/Nodes: Thoracic inlet is within normal limits. No hilar or mediastinal adenopathy is noted. The esophagus is unremarkable. Lungs/Pleura: The lungs are well aerated bilaterally. Focal consolidation is noted in right lower lobe centrally. Patchy infiltrate is also noted in the inferior aspect of the right middle lobe. These changes are consistent with multifocal infiltrate. Musculoskeletal: Degenerative changes of the thoracic spine are noted. CT ABDOMEN PELVIS FINDINGS Hepatobiliary: Fatty infiltration of the liver is noted. The gallbladder is unremarkable. Pancreas: Pancreas is well visualized without evidence of pancreatic necrosis the tail appears atrophic. Some peripancreatic inflammatory changes and fluid are noted surrounding the head and uncinate process consistent with focal pancreatitis. Spleen: Normal in size without focal abnormality. Adrenals/Urinary Tract: Adrenal glands are within normal limits. Kidneys demonstrate a normal enhancement pattern. Small 1 cm cyst is noted in the lower pole of the right kidney. This appears simple in nature and no further follow-up is recommended. No obstructive changes are seen. The bladder is over distended. Stomach/Bowel: Stomach is within normal limits. Appendix appears normal. No evidence of bowel wall thickening, distention, or inflammatory changes. Small bowel and stomach are unremarkable. Vascular/Lymphatic: No significant vascular findings  are present. No enlarged abdominal or pelvic lymph nodes. Reproductive: Prostate is unremarkable. Other: No abdominal wall hernia or abnormality. No abdominopelvic ascites. Musculoskeletal: No acute or significant osseous findings. IMPRESSION: CT of the chest: Patchy multifocal infiltrate in the right lung. CT of the abdomen and pelvis: Changes consistent with focal pancreatitis in the head and uncinate process. Electronically Signed   By: Oneil Devonshire M.D.   On: 10/30/2024 00:23   DG Chest Port 1 View Result Date: 10/12/2024 CLINICAL DATA:  Shortness of breath EXAM: PORTABLE CHEST 1 VIEW COMPARISON:  Chest x-ray 08/15/2024 FINDINGS: The heart size and mediastinal contours are within normal limits. Both lungs are clear. There is mild elevation of the left hemidiaphragm. The visualized skeletal structures are unremarkable. IMPRESSION: No active disease. Electronically Signed   By: Greig Pique M.D.   On: 10/12/2024 21:34    Lebron JINNY Cage, MD  [facility]  If 7PM-7AM, please contact night-coverage [email or web address] 11/06/2024, 6:36 PM   LOS: 7 days   "

## 2024-11-07 DIAGNOSIS — K852 Alcohol induced acute pancreatitis without necrosis or infection: Secondary | ICD-10-CM | POA: Diagnosis not present

## 2024-11-07 NOTE — Progress Notes (Signed)
 "          PROGRESS NOTE  Gary [name] FMW:[id number] DOB: 1986-07-28 DOA: 10/29/2024 PCP: Pcp, No  Brief History:  38yo with h/o alcohol  abuse, chronic pancreatitis with frequent hospitalizations for acute on chronic pancreatitis, HIV/AIDS, medication noncompliance, seizure disorder, bipolar schizophrenia, PTSD and homelessness who has had frequent recent ER visits who presented on 12/21 with abdominal pain. Continues with active drinking. CT with focal pancreatitis of the head and uncinate process as well as patchy infiltrate in the R lung. He was given Zosyn , IV, and Dilaudid .     Today, sleeping, didn't wake him. Overall continues to ask for pain meds    Assessment/Plan:  Acute alcoholic pancreatitis Acute on chronic pancreatitis in the setting of continued alcohol  abuse CT abdomen pelvis showing acute pancreatitis of uncinate process and pancreatic head lipase 282 Continue Zofran  as needed Restart Creon , pain management 43 ER visits and 7 admissions in the last 6 months Needs complete abstinence/cessation of alcohol  given his recurrent pancreatitis events, as this is the etiology to his multiple hospitalizations and ED visits  Aspiration pneumonia SIRS Patient meets SIRS criteria with fever to 102.7, tachycardia, tachypnea Chest x-ray showing evidence for aspiration pneumonia In the ED patient received IV Zosyn  -> Unasyn --> completed p.o. Augmentin  X 7 days of antibiotics  Alcohol  withdrawal Patient with continued alcohol  abuse, last drink day of admission, shortly after last dc  Counsel on complete alcohol  cessation/abstinence He has had severe withdrawal on prior hospitalizations Completed phenobarbital  taper Psychiatry consulted, appreciated recs  ??Gram positive bacteremia Likely contaminant 12/22 blood culture = staph species on BCID; also has GPR, growing alloiococcus otitis, cornyebacterium pseudodiptheriae. Corynebacterium all possibly contaminant vs related to  aspiration pneumonia Repeat BC X 2 NGTD ID consulted recommend repeat blood culture if persistent, will evaluate for source control, if negative will confirm contaminant D/C vanc, Unasyn ---> switched to po Augmentin  and complete 7 days of antibiotics  Hypokalemia/hypomagnesemia Replace as needed  HIV Not currently taking Biktarvy , Bactrim  ID on board, no ART inpatient due to noncompliance Outpatient follow-up with infectious disease CD 4 161/36%  Bipolar disorder (HCC) PTSD (post-traumatic stress disorder) GAD (generalized anxiety disorder) Previously on Seroquel , not currently taking medication Psychiatry consulted He does not currently meet IVC crtieria  Thrombocytopenia Likely associated with bone marrow suppression in the setting of alcohol  use  History of seizure Associated with ETOH withdrawal, last admitted for this issue from 10/29-11/3 Continue Keppra  500 mg twice daily Continue Ativan  as needed for seizure Seizure precautions ordered  Tobacco use disorder Cessation encouraged cessation Nicotine  patch ordered         Consultants: Psychiatry PCCM ID   Procedures: None   Antibiotics: Completed Augmentin          Family Communication:  no Family at bedside   Code Status:  FULL   DVT Prophylaxis:   [place] Lovenox    Procedures: As Listed in Progress Note Above  Antibiotics: Vanc 12.22>> 12/27 Unasyn  12/22>> 12/27 Augmentin  12/27       Objective: Vitals:   11/06/24 0426 11/06/24 1330 11/06/24 2021 11/07/24 0347  BP: 99/67 98/72 96/70  106/72  Pulse: 68 71 75 77  Resp: 18 18 18 18   Temp:  98.4 F (36.9 C) 98.2 F (36.8 C) 98.7 F (37.1 C)  TempSrc:  Oral Oral Oral  SpO2: 94% 95% 97% 98%  Weight:      Height:        Intake/Output Summary (Last 24 hours) at 11/07/2024 1145 Last data filed  at 11/07/2024 0540 Gross per 24 hour  Intake 243 ml  Output 1300 ml  Net -1057 ml   Weight change:  Exam: General: NAD  Cardiovascular:  S1, S2 present Respiratory: CTAB Abdomen: Soft, tender, nondistended, bowel sounds present Musculoskeletal: No bilateral pedal edema noted Skin: Normal Psychiatry: Fair mood    Data Reviewed: I have personally reviewed following labs and imaging studies Basic Metabolic Panel: Recent Labs  Lab 11/01/24 0318 11/02/24 0313 11/04/24 0542  NA 136 133* 136  K 3.3* 3.6 3.9  CL 93* 94* 98  CO2 28 27 26   GLUCOSE 74 96 109*  BUN <5* <5* <5*  CREATININE 0.42* 0.31* 0.47*  CALCIUM  9.4 8.7* 9.5  MG 1.5* 1.8  --    Liver Function Tests: Recent Labs  Lab 11/01/24 0318  AST 38  ALT 30  ALKPHOS 106  BILITOT 0.4  PROT 6.7  ALBUMIN 3.4*   No results for input(s): LIPASE, AMYLASE in the last 168 hours.  No results for input(s): AMMONIA in the last 168 hours. Coagulation Profile: No results for input(s): INR, PROTIME in the last 168 hours. CBC: Recent Labs  Lab 11/01/24 0318 11/02/24 0313 11/04/24 0542  WBC 6.1 4.4 3.5*  NEUTROABS 4.6  --   --   HGB 11.1* 10.4* 10.6*  HCT 33.6* 31.4* 31.6*  MCV 92.3 91.3 91.9  PLT 85* 90* 160   Cardiac Enzymes: No results for input(s): CKTOTAL, CKMB, CKMBINDEX, TROPONINI in the last 168 hours. BNP: Invalid input(s): POCBNP CBG: No results for input(s): GLUCAP in the last 168 hours. HbA1C: No results for input(s): HGBA1C in the last 72 hours. Urine analysis:    Component Value Date/Time   COLORURINE YELLOW 10/30/2024 0149   APPEARANCEUR CLEAR 10/30/2024 0149   APPEARANCEUR Clear 11/15/2014 1755   LABSPEC 1.037 (H) 10/30/2024 0149   LABSPEC 1.002 11/15/2014 1755   PHURINE 6.0 10/30/2024 0149   GLUCOSEU NEGATIVE 10/30/2024 0149   GLUCOSEU Negative 11/15/2014 1755   HGBUR SMALL (A) 10/30/2024 0149   BILIRUBINUR NEGATIVE 10/30/2024 0149   BILIRUBINUR Negative 11/15/2014 1755   KETONESUR 80 (A) 10/30/2024 0149   PROTEINUR >=300 (A) 10/30/2024 0149   NITRITE NEGATIVE 10/30/2024 0149   LEUKOCYTESUR NEGATIVE  10/30/2024 0149   LEUKOCYTESUR Negative 11/15/2014 1755   Sepsis Labs: @LABRCNTIP (procalcitonin:4,lacticidven:4) ) Recent Results (from the past 240 hours)  Resp panel by RT-PCR (RSV, Flu A&B, Covid) Anterior Nasal Swab     Status: None   Collection Time: 10/29/24  8:43 PM   Specimen: Anterior Nasal Swab  Result Value Ref Range Status   SARS Coronavirus 2 by RT PCR NEGATIVE NEGATIVE Final    Comment: (NOTE) SARS-CoV-2 target nucleic acids are NOT DETECTED.  The SARS-CoV-2 RNA is generally detectable in upper respiratory specimens during the acute phase of infection. The lowest concentration of SARS-CoV-2 viral copies this assay can detect is 138 copies/mL. A negative result does not preclude SARS-Cov-2 infection and should not be used as the sole basis for treatment or other patient management decisions. A negative result may occur with  improper specimen collection/handling, submission of specimen other than nasopharyngeal swab, presence of viral mutation(s) within the areas targeted by this assay, and inadequate number of viral copies(<138 copies/mL). A negative result must be combined with clinical observations, patient history, and epidemiological information. The expected result is Negative.  Fact Sheet for Patients:  bloggercourse.com  Fact Sheet for Healthcare Providers:  seriousbroker.it  This test is no t yet approved or cleared by the  [place]  FDA and  has been authorized for detection and/or diagnosis of SARS-CoV-2 by FDA under an Emergency Use Authorization (EUA). This EUA will remain  in effect (meaning this test can be used) for the duration of the COVID-19 declaration under Section 564(b)(1) of the Act, 21 U.S.C.section 360bbb-3(b)(1), unless the authorization is terminated  or revoked sooner.       Influenza A by PCR NEGATIVE NEGATIVE Final   Influenza B by PCR NEGATIVE NEGATIVE Final    Comment:  (NOTE) The Xpert Xpress SARS-CoV-2/FLU/RSV plus assay is intended as an aid in the diagnosis of influenza from Nasopharyngeal swab specimens and should not be used as a sole basis for treatment. Nasal washings and aspirates are unacceptable for Xpert Xpress SARS-CoV-2/FLU/RSV testing.  Fact Sheet for Patients: bloggercourse.com  Fact Sheet for Healthcare Providers: seriousbroker.it  This test is not yet approved or cleared by the [place]  FDA and has been authorized for detection and/or diagnosis of SARS-CoV-2 by FDA under an Emergency Use Authorization (EUA). This EUA will remain in effect (meaning this test can be used) for the duration of the COVID-19 declaration under Section 564(b)(1) of the Act, 21 U.S.C. section 360bbb-3(b)(1), unless the authorization is terminated or revoked.     Resp Syncytial Virus by PCR NEGATIVE NEGATIVE Final    Comment: (NOTE) Fact Sheet for Patients: bloggercourse.com  Fact Sheet for Healthcare Providers: seriousbroker.it  This test is not yet approved or cleared by the [place]  FDA and has been authorized for detection and/or diagnosis of SARS-CoV-2 by FDA under an Emergency Use Authorization (EUA). This EUA will remain in effect (meaning this test can be used) for the duration of the COVID-19 declaration under Section 564(b)(1) of the Act, 21 U.S.C. section 360bbb-3(b)(1), unless the authorization is terminated or revoked.  Performed at [facility], [address], [place], [place] [address]   Blood culture (routine x 2)     Status: Abnormal   Collection Time: 10/30/24  1:49 AM   Specimen: BLOOD  Result Value Ref Range Status   Specimen Description   Final    BLOOD RIGHT ANTECUBITAL Performed at [facility], [address] [place], [place] [address]    Special Requests   Final    BOTTLES DRAWN  AEROBIC AND ANAEROBIC Blood Culture results may not be optimal due to an inadequate volume of blood received in culture bottles Performed at [facility], [address], [place], [place] [address]    Culture  Setup Time   Final    GRAM POSITIVE COCCI ANAEROBIC BOTTLE ONLY CRITICAL RESULT CALLED TO, READ BACK BY AND VERIFIED WITH:   MITCHELL LILLISTON PHARMD 10/31/2024 @ 0429 BY DD GRAM POSITIVE RODS AEROBIC BOTTLE ONLY RBV CHRISTINE SHADE PHARM D 10/31/2024 @0738  BY DD    Culture (A)  Final    ALLOIOCOCCUS OTITIS CORYNEBACTERIUM PSEUDODIPHTHERIAE Standardized susceptibility testing for this organism is not available. Performed at [facility], [address], [place], [place] [address]    Report Status 11/03/2024 FINAL  Final  Blood culture (routine x 2)     Status: Abnormal   Collection Time: 10/30/24  1:49 AM   Specimen: BLOOD LEFT HAND  Result Value Ref Range Status   Specimen Description   Final    BLOOD LEFT HAND Performed at [facility], [address] [place], [place] [address]    Special Requests   Final    BOTTLES DRAWN AEROBIC AND ANAEROBIC  Blood Culture results may not be optimal due to an inadequate volume of blood received in culture bottles Performed at [facility], [address], [place], [place] [address]    Culture  Setup Time   Final    GRAM POSITIVE COCCI ANAEROBIC BOTTLE ONLY CRITICAL VALUE NOTED.  VALUE IS CONSISTENT WITH PREVIOUSLY REPORTED AND CALLED VALUE. GRAM POSITIVE RODS AEROBIC BOTTLE ONLY CRITICAL RESULT CALLED TO, READ BACK BY AND VERIFIED WITH: PHARMD NICK.G AT 0630 ON 10/31/2024 BY T.SAAD.    Culture (A)  Final    ALLOIOCOCCUS OTITIS Standardized susceptibility testing for this organism is not available. GRAM POSITIVE RODS CALL MICROBIOLOGY LAB IF SENSITIVITIES ARE REQUIRED. Performed at [facility], [address] [place], [place] [address]    Report Status 11/05/2024 FINAL   Final  Blood Culture ID Panel (Reflexed)     Status: None   Collection Time: 10/30/24  1:49 AM  Result Value Ref Range Status   Enterococcus faecalis NOT DETECTED NOT DETECTED Final   Enterococcus Faecium NOT DETECTED NOT DETECTED Final   Listeria monocytogenes NOT DETECTED NOT DETECTED Final   Staphylococcus species NOT DETECTED NOT DETECTED Final   Staphylococcus aureus (BCID) NOT DETECTED NOT DETECTED Final   Staphylococcus epidermidis NOT DETECTED NOT DETECTED Final   Staphylococcus lugdunensis NOT DETECTED NOT DETECTED Final   Streptococcus species NOT DETECTED NOT DETECTED Final   Streptococcus agalactiae NOT DETECTED NOT DETECTED Final   Streptococcus pneumoniae NOT DETECTED NOT DETECTED Final   Streptococcus pyogenes NOT DETECTED NOT DETECTED Final   A.calcoaceticus-baumannii NOT DETECTED NOT DETECTED Final   Bacteroides fragilis NOT DETECTED NOT DETECTED Final   Enterobacterales NOT DETECTED NOT DETECTED Final   Enterobacter cloacae complex NOT DETECTED NOT DETECTED Final   Escherichia coli NOT DETECTED NOT DETECTED Final   Klebsiella aerogenes NOT DETECTED NOT DETECTED Final   Klebsiella oxytoca NOT DETECTED NOT DETECTED Final   Klebsiella pneumoniae NOT DETECTED NOT DETECTED Final   Proteus species NOT DETECTED NOT DETECTED Final   Salmonella species NOT DETECTED NOT DETECTED Final   Serratia marcescens NOT DETECTED NOT DETECTED Final   Haemophilus influenzae NOT DETECTED NOT DETECTED Final   Neisseria meningitidis NOT DETECTED NOT DETECTED Final   Pseudomonas aeruginosa NOT DETECTED NOT DETECTED Final   Stenotrophomonas maltophilia NOT DETECTED NOT DETECTED Final   Candida albicans NOT DETECTED NOT DETECTED Final   Candida auris NOT DETECTED NOT DETECTED Final   Candida glabrata NOT DETECTED NOT DETECTED Final   Candida krusei NOT DETECTED NOT DETECTED Final   Candida parapsilosis NOT DETECTED NOT DETECTED Final   Candida tropicalis NOT DETECTED NOT DETECTED Final    Cryptococcus neoformans/gattii NOT DETECTED NOT DETECTED Final    Comment: Performed at [facility], [address], [place], [place] [address]  MRSA Next Gen by PCR, Nasal     Status: None   Collection Time: 10/30/24  2:07 PM   Specimen: Nasal Mucosa; Nasal Swab  Result Value Ref Range Status   MRSA by PCR Next Gen NOT DETECTED NOT DETECTED Final    Comment: (NOTE) The GeneXpert MRSA Assay (FDA approved for NASAL specimens only), is one component of a comprehensive MRSA colonization surveillance program. It is not intended to diagnose MRSA infection nor to guide or monitor treatment for MRSA infections. Test performance is not FDA approved in patients less than 68 years old. Performed at [facility], [address], [place], [place] [address]   MRSA Next  Gen by PCR, Nasal     Status: None   Collection Time: 11/04/24 12:41 PM   Specimen: Nasal Mucosa; Nasal Swab  Result Value Ref Range Status   MRSA by PCR Next Gen NOT DETECTED NOT DETECTED Final    Comment: (NOTE) The GeneXpert MRSA Assay (FDA approved for NASAL specimens only), is one component of a comprehensive MRSA colonization surveillance program. It is not intended to diagnose MRSA infection nor to guide or monitor treatment for MRSA infections. Test performance is not FDA approved in patients less than 64 years old. Performed at [facility], [address] [place], [place] [address]   Culture, blood (Routine X 2) w Reflex to ID Panel     Status: None (Preliminary result)   Collection Time: 11/04/24  2:20 PM   Specimen: BLOOD  Result Value Ref Range Status   Specimen Description   Final    BLOOD BLOOD LEFT HAND Performed at [facility], [address], [place], [place] [address]    Special Requests   Final    BOTTLES DRAWN AEROBIC AND ANAEROBIC Blood Culture results may not be optimal due to an inadequate volume of blood received in culture  bottles Performed at [facility], [address] [place], [place] [address]    Culture   Final    NO GROWTH 3 DAYS Performed at [facility], [address], [place], [place] [address]    Report Status PENDING  Incomplete  Culture, blood (Routine X 2) w Reflex to ID Panel     Status: None (Preliminary result)   Collection Time: 11/04/24  2:29 PM   Specimen: BLOOD  Result Value Ref Range Status   Specimen Description   Final    BLOOD BLOOD RIGHT HAND Performed at [facility], [address], [place], [place] [address]    Special Requests   Final    BOTTLES DRAWN AEROBIC AND ANAEROBIC Blood Culture adequate volume Performed at [facility], [address], [place], [place] [address]    Culture   Final    NO GROWTH 3 DAYS Performed at [facility], [address] [place], [place] [address]    Report Status PENDING  Incomplete     Scheduled Meds:  dextromethorphan -guaiFENesin   1 tablet Oral BID   enoxaparin  (LOVENOX ) injection  40 mg Subcutaneous Q24H   folic acid   1 mg Oral Daily   levETIRAcetam   500 mg Oral BID   lipase/protease/amylase  36,000 Units Oral TID AC   multivitamin with minerals  1 tablet Oral Daily   polyethylene glycol  17 g Oral BID   senna-docusate  1 tablet Oral BID   sodium chloride  flush  3 mL Intravenous Q12H   sodium chloride  flush  3 mL Intravenous Q12H   thiamine   100 mg Oral Daily   Continuous Infusions:    Procedures/Studies: CT CHEST ABDOMEN PELVIS W CONTRAST Result Date: 10/30/2024 CLINICAL DATA:  Abdominal pain for 1 day, history of pancreatitis EXAM: CT CHEST, ABDOMEN, AND PELVIS WITH CONTRAST TECHNIQUE: Multidetector CT imaging of the chest, abdomen and pelvis was performed following the standard protocol during bolus administration of intravenous contrast. RADIATION DOSE REDUCTION: This exam was performed according to the departmental dose-optimization program which  includes automated exposure control, adjustment of the mA and/or kV according to patient size and/or use of iterative reconstruction technique. CONTRAST:  OMNIPAQUE  IOHEXOL  300 MG/ML  SOLN COMPARISON:  06/16/2024 FINDINGS:  CT CHEST FINDINGS Cardiovascular: Mild atherosclerotic calcifications of the aorta are noted. No aneurysmal dilatation is seen. Heart is not significantly enlarged in size. Minimal coronary calcifications are noted. Pulmonary artery as visualized is within normal limits although not timed for embolus evaluation. Mediastinum/Nodes: Thoracic inlet is within normal limits. No hilar or mediastinal adenopathy is noted. The esophagus is unremarkable. Lungs/Pleura: The lungs are well aerated bilaterally. Focal consolidation is noted in right lower lobe centrally. Patchy infiltrate is also noted in the inferior aspect of the right middle lobe. These changes are consistent with multifocal infiltrate. Musculoskeletal: Degenerative changes of the thoracic spine are noted. CT ABDOMEN PELVIS FINDINGS Hepatobiliary: Fatty infiltration of the liver is noted. The gallbladder is unremarkable. Pancreas: Pancreas is well visualized without evidence of pancreatic necrosis the tail appears atrophic. Some peripancreatic inflammatory changes and fluid are noted surrounding the head and uncinate process consistent with focal pancreatitis. Spleen: Normal in size without focal abnormality. Adrenals/Urinary Tract: Adrenal glands are within normal limits. Kidneys demonstrate a normal enhancement pattern. Small 1 cm cyst is noted in the lower pole of the right kidney. This appears simple in nature and no further follow-up is recommended. No obstructive changes are seen. The bladder is over distended. Stomach/Bowel: Stomach is within normal limits. Appendix appears normal. No evidence of bowel wall thickening, distention, or inflammatory changes. Small bowel and stomach are unremarkable. Vascular/Lymphatic: No  significant vascular findings are present. No enlarged abdominal or pelvic lymph nodes. Reproductive: Prostate is unremarkable. Other: No abdominal wall hernia or abnormality. No abdominopelvic ascites. Musculoskeletal: No acute or significant osseous findings. IMPRESSION: CT of the chest: Patchy multifocal infiltrate in the right lung. CT of the abdomen and pelvis: Changes consistent with focal pancreatitis in the head and uncinate process. Electronically Signed   By: Oneil Devonshire M.D.   On: 10/30/2024 00:23   DG Chest Port 1 View Result Date: 10/12/2024 CLINICAL DATA:  Shortness of breath EXAM: PORTABLE CHEST 1 VIEW COMPARISON:  Chest x-ray 08/15/2024 FINDINGS: The heart size and mediastinal contours are within normal limits. Both lungs are clear. There is mild elevation of the left hemidiaphragm. The visualized skeletal structures are unremarkable. IMPRESSION: No active disease. Electronically Signed   By: Greig Pique M.D.   On: 10/12/2024 21:34    Lebron JINNY Cage, MD  [facility]  If 7PM-7AM, please contact night-coverage [email or web address] 11/07/2024, 11:45 AM   LOS: 8 days   "

## 2024-11-08 ENCOUNTER — Other Ambulatory Visit (HOSPITAL_COMMUNITY): Payer: Self-pay

## 2024-11-08 LAB — BASIC METABOLIC PANEL WITH GFR
Anion gap: 13 (ref 5–15)
BUN: 10 mg/dL (ref 6–20)
CO2: 24 mmol/L (ref 22–32)
Calcium: 9.8 mg/dL (ref 8.9–10.3)
Chloride: 96 mmol/L — ABNORMAL LOW (ref 98–111)
Creatinine, Ser: 0.6 mg/dL — ABNORMAL LOW (ref 0.61–1.24)
GFR, Estimated: >60 mL/min
Glucose, Bld: 103 mg/dL — ABNORMAL HIGH (ref 70–99)
Potassium: 4.6 mmol/L (ref 3.5–5.1)
Sodium: 133 mmol/L — ABNORMAL LOW (ref 135–145)

## 2024-11-08 LAB — CBC
HCT: 35.2 % — ABNORMAL LOW (ref 39.0–52.0)
Hemoglobin: 11.5 g/dL — ABNORMAL LOW (ref 13.0–17.0)
MCH: 30.3 pg (ref 26.0–34.0)
MCHC: 32.7 g/dL (ref 30.0–36.0)
MCV: 92.9 fL (ref 80.0–100.0)
Platelets: 611 K/uL — ABNORMAL HIGH (ref 150–400)
RBC: 3.79 MIL/uL — ABNORMAL LOW (ref 4.22–5.81)
RDW: 16.9 % — ABNORMAL HIGH (ref 11.5–15.5)
WBC: 4.6 K/uL (ref 4.0–10.5)
nRBC: 0 % (ref 0.0–0.2)

## 2024-11-08 MED ORDER — VITAMIN B-1 100 MG PO TABS: 100.0000 mg | ORAL_TABLET | Freq: Every day | ORAL | Status: AC

## 2024-11-08 MED ORDER — PANCRELIPASE (LIP-PROT-AMYL) 36000-114000 UNITS PO CPEP
36000.0000 [IU] | ORAL_CAPSULE | Freq: Three times a day (TID) | ORAL | 0 refills | Status: AC
  Filled 2024-11-08: qty 100, 34d supply, fill #0

## 2024-11-08 MED ORDER — FOLIC ACID 1 MG PO TABS: 1.0000 mg | ORAL_TABLET | Freq: Every day | ORAL | Status: AC

## 2024-11-08 MED ORDER — LEVETIRACETAM 500 MG PO TABS
500.0000 mg | ORAL_TABLET | Freq: Two times a day (BID) | ORAL | 0 refills | Status: AC
  Filled 2024-11-08: qty 180, 90d supply, fill #0

## 2024-11-08 NOTE — Discharge Summary (Addendum)
 " Physician Discharge Summary   Patient: Gary [name] MRN: [id number] DOB: 01/05/1986  Admit date:     10/29/2024  Discharge date: 11/08/2024  Discharge Physician: Toribio Door   PCP: Pcp, No   Recommendations at discharge:  Follow-up with infectious disease for consideration of treatment of HIV Continue to encourage abstinence from alcohol   Discharge Diagnoses: Principal Problem:   Acute alcoholic pancreatitis Active Problems:   Aspiration pneumonia (HCC)   Alcohol  withdrawal (HCC)   High anion gap metabolic acidosis   HIV disease (HCC)   Bipolar disorder (HCC)   Tobacco use disorder   Thrombocytopenia   GAD (generalized anxiety disorder)   PTSD (post-traumatic stress disorder)   SIRS (systemic inflammatory response syndrome) (HCC)   History of seizure   Bacteremia   Hospital Course: 38 year old man PMH alcohol  use disorder, recurrent alcoholic pancreatitis, HIV, PTSD, bipolar, medication noncompliance, heavy alcohol  use, presenting with abdominal pain, restlessness and visible hand tremor bilaterally.  Admitted for acute alcoholic pancreatitis, aspiration pneumonia.  Required treatment for acute alcohol  withdrawal.  Was seen by psychiatry and PCCM.  Condition gradually improved.  Treated for aspiration pneumonia with clinical resolution.  Seen by infectious disease.  Plan for outpatient follow-up  Consultants Psychiatry PCCM ID  Procedures/Events None   Acute alcoholic pancreatitis Alcohol  use disorder CT abdomen and pelvis showed acute pancreatitis, lipase 282 on admission Tolerating diet, continue Creon  Refrain from further alcohol   Aspiration pneumonia Sepsis on admission Fever 102.7, tachycardia and tachypnea, chest x-ray showed pneumonia Treated with antibiotics, has completed treatment  Acute alcohol  withdrawal High anion gap metabolic acidosis Completed phenobarb taper Seen by psychiatry  Positive blood culture gram-positive, contaminant Repeat  blood culture x 2 no growth thus far, seen by ID, okay for discharge  HIV Noncompliant with treatment, patient not currently taking Biktarvy  or Bactrim  No HAART secondary to noncompliance Outpatient follow-up with ID Appointment is 11/27/24 @ 3pm   Bipolar disorder PTSD GAD Not currently on any medication Seen by psychiatry  Thrombocytopenia Likely bone marrow suppression secondary to alcohol  use Resolved   History of seizure associated with alcohol  withdrawal Continue Keppra   Homelessness  High utilization 43 ED visits, 7 admissions last 6 months  Sepsis has been ruled out   Disposition: Home Diet recommendation:  Regular diet DISCHARGE MEDICATION: Allergies as of 11/08/2024       Reactions   Risperidone  And Paliperidone Other (See Comments)   Patient states causes opposite effect (he will not take again), and gave him psychotic thoughts   Tegretol  [carbamazepine ] Other (See Comments)   Vertigo  Paralysis   Caffeine Palpitations        Medication List     STOP taking these medications    bictegravir-emtricitabine -tenofovir  AF 50-200-25 MG Tabs tablet Commonly known as: BIKTARVY    sulfamethoxazole -trimethoprim  800-160 MG tablet Commonly known as: BACTRIM  DS       TAKE these medications    folic acid  1 MG tablet Commonly known as: FOLVITE  Take 1 tablet (1 mg total) by mouth daily. Start taking on: November 09, 2024   levETIRAcetam  500 MG tablet Commonly known as: KEPPRA  Take 1 tablet (500 mg total) by mouth 2 (two) times daily.   lipase/protease/amylase 63999 UNITS Cpep capsule Commonly known as: CREON  Take 1 capsule (36,000 Units total) by mouth 3 (three) times daily before meals.   multivitamin with minerals Tabs tablet Take 1 tablet by mouth daily.   thiamine  100 MG tablet Commonly known as: VITAMIN B1 Take 1 tablet (100 mg total) by  mouth daily. Start taking on: November 09, 2024        Follow-up Information     Vu, Constance DASEN, MD  Follow up on 11/27/2024.   Specialty: Infectious Diseases Why: 3 pm Contact information: [address] [place] [address] [phone]                Discharge Exam: Fredricka Weights   10/30/24 1346  Weight: 68.2 kg   Physical Exam Vitals reviewed.  Constitutional:      General: He is not in acute distress.    Appearance: He is not ill-appearing or toxic-appearing.  Cardiovascular:     Rate and Rhythm: Normal rate and regular rhythm.     Heart sounds: No murmur heard. Pulmonary:     Effort: Pulmonary effort is normal. No respiratory distress.     Breath sounds: No wheezing, rhonchi or rales.  Abdominal:     General: There is no distension.     Palpations: Abdomen is soft.  Neurological:     Mental Status: He is alert.  Psychiatric:        Mood and Affect: Mood normal.        Behavior: Behavior normal.      Condition at discharge: good  The results of significant diagnostics from this hospitalization (including imaging, microbiology, ancillary and laboratory) are listed below for reference.   Imaging Studies: CT CHEST ABDOMEN PELVIS W CONTRAST Result Date: 10/30/2024 CLINICAL DATA:  Abdominal pain for 1 day, history of pancreatitis EXAM: CT CHEST, ABDOMEN, AND PELVIS WITH CONTRAST TECHNIQUE: Multidetector CT imaging of the chest, abdomen and pelvis was performed following the standard protocol during bolus administration of intravenous contrast. RADIATION DOSE REDUCTION: This exam was performed according to the departmental dose-optimization program which includes automated exposure control, adjustment of the mA and/or kV according to patient size and/or use of iterative reconstruction technique. CONTRAST:  OMNIPAQUE  IOHEXOL  300 MG/ML  SOLN COMPARISON:  06/16/2024 FINDINGS: CT CHEST FINDINGS Cardiovascular: Mild atherosclerotic calcifications of the aorta are noted. No aneurysmal dilatation is seen. Heart is not significantly enlarged in size.  Minimal coronary calcifications are noted. Pulmonary artery as visualized is within normal limits although not timed for embolus evaluation. Mediastinum/Nodes: Thoracic inlet is within normal limits. No hilar or mediastinal adenopathy is noted. The esophagus is unremarkable. Lungs/Pleura: The lungs are well aerated bilaterally. Focal consolidation is noted in right lower lobe centrally. Patchy infiltrate is also noted in the inferior aspect of the right middle lobe. These changes are consistent with multifocal infiltrate. Musculoskeletal: Degenerative changes of the thoracic spine are noted. CT ABDOMEN PELVIS FINDINGS Hepatobiliary: Fatty infiltration of the liver is noted. The gallbladder is unremarkable. Pancreas: Pancreas is well visualized without evidence of pancreatic necrosis the tail appears atrophic. Some peripancreatic inflammatory changes and fluid are noted surrounding the head and uncinate process consistent with focal pancreatitis. Spleen: Normal in size without focal abnormality. Adrenals/Urinary Tract: Adrenal glands are within normal limits. Kidneys demonstrate a normal enhancement pattern. Small 1 cm cyst is noted in the lower pole of the right kidney. This appears simple in nature and no further follow-up is recommended. No obstructive changes are seen. The bladder is over distended. Stomach/Bowel: Stomach is within normal limits. Appendix appears normal. No evidence of bowel wall thickening, distention, or inflammatory changes. Small bowel and stomach are unremarkable. Vascular/Lymphatic: No significant vascular findings are present. No enlarged abdominal or pelvic lymph nodes. Reproductive: Prostate is unremarkable. Other: No abdominal wall hernia or abnormality.  No abdominopelvic ascites. Musculoskeletal: No acute or significant osseous findings. IMPRESSION: CT of the chest: Patchy multifocal infiltrate in the right lung. CT of the abdomen and pelvis: Changes consistent with focal pancreatitis  in the head and uncinate process. Electronically Signed   By: Oneil Devonshire M.D.   On: 10/30/2024 00:23   DG Chest Port 1 View Result Date: 10/12/2024 CLINICAL DATA:  Shortness of breath EXAM: PORTABLE CHEST 1 VIEW COMPARISON:  Chest x-ray 08/15/2024 FINDINGS: The heart size and mediastinal contours are within normal limits. Both lungs are clear. There is mild elevation of the left hemidiaphragm. The visualized skeletal structures are unremarkable. IMPRESSION: No active disease. Electronically Signed   By: Greig Pique M.D.   On: 10/12/2024 21:34    Microbiology: Results for orders placed or performed during the hospital encounter of 10/29/24  Resp panel by RT-PCR (RSV, Flu A&B, Covid) Anterior Nasal Swab     Status: None   Collection Time: 10/29/24  8:43 PM   Specimen: Anterior Nasal Swab  Result Value Ref Range Status   SARS Coronavirus 2 by RT PCR NEGATIVE NEGATIVE Final    Comment: (NOTE) SARS-CoV-2 target nucleic acids are NOT DETECTED.  The SARS-CoV-2 RNA is generally detectable in upper respiratory specimens during the acute phase of infection. The lowest concentration of SARS-CoV-2 viral copies this assay can detect is 138 copies/mL. A negative result does not preclude SARS-Cov-2 infection and should not be used as the sole basis for treatment or other patient management decisions. A negative result may occur with  improper specimen collection/handling, submission of specimen other than nasopharyngeal swab, presence of viral mutation(s) within the areas targeted by this assay, and inadequate number of viral copies(<138 copies/mL). A negative result must be combined with clinical observations, patient history, and epidemiological information. The expected result is Negative.  Fact Sheet for Patients:  bloggercourse.com  Fact Sheet for Healthcare Providers:  seriousbroker.it  This test is no t yet approved or cleared by the  [place]  FDA and  has been authorized for detection and/or diagnosis of SARS-CoV-2 by FDA under an Emergency Use Authorization (EUA). This EUA will remain  in effect (meaning this test can be used) for the duration of the COVID-19 declaration under Section 564(b)(1) of the Act, 21 U.S.C.section 360bbb-3(b)(1), unless the authorization is terminated  or revoked sooner.       Influenza A by PCR NEGATIVE NEGATIVE Final   Influenza B by PCR NEGATIVE NEGATIVE Final    Comment: (NOTE) The Xpert Xpress SARS-CoV-2/FLU/RSV plus assay is intended as an aid in the diagnosis of influenza from Nasopharyngeal swab specimens and should not be used as a sole basis for treatment. Nasal washings and aspirates are unacceptable for Xpert Xpress SARS-CoV-2/FLU/RSV testing.  Fact Sheet for Patients: bloggercourse.com  Fact Sheet for Healthcare Providers: seriousbroker.it  This test is not yet approved or cleared by the [place]  FDA and has been authorized for detection and/or diagnosis of SARS-CoV-2 by FDA under an Emergency Use Authorization (EUA). This EUA will remain in effect (meaning this test can be used) for the duration of the COVID-19 declaration under Section 564(b)(1) of the Act, 21 U.S.C. section 360bbb-3(b)(1), unless the authorization is terminated or revoked.     Resp Syncytial Virus by PCR NEGATIVE NEGATIVE Final    Comment: (NOTE) Fact Sheet for Patients: bloggercourse.com  Fact Sheet for Healthcare Providers: seriousbroker.it  This test is not yet approved or cleared by the [place]  FDA and has been authorized for detection  and/or diagnosis of SARS-CoV-2 by FDA under an Emergency Use Authorization (EUA). This EUA will remain in effect (meaning this test can be used) for the duration of the COVID-19 declaration under Section 564(b)(1) of the Act, 21 U.S.C. section  360bbb-3(b)(1), unless the authorization is terminated or revoked.  Performed at [facility], [address], [place], [place] [address]   Blood culture (routine x 2)     Status: Abnormal   Collection Time: 10/30/24  1:49 AM   Specimen: BLOOD  Result Value Ref Range Status   Specimen Description   Final    BLOOD RIGHT ANTECUBITAL Performed at [facility], [address], [place], [place] [address]    Special Requests   Final    BOTTLES DRAWN AEROBIC AND ANAEROBIC Blood Culture results may not be optimal due to an inadequate volume of blood received in culture bottles Performed at [facility], [address], [place], [place] [address]    Culture  Setup Time   Final    GRAM POSITIVE COCCI ANAEROBIC BOTTLE ONLY CRITICAL RESULT CALLED TO, READ BACK BY AND VERIFIED WITH:   MITCHELL LILLISTON PHARMD 10/31/2024 @ 0429 BY DD GRAM POSITIVE RODS AEROBIC BOTTLE ONLY RBV CHRISTINE SHADE PHARM D 10/31/2024 @0738  BY DD    Culture (A)  Final    ALLOIOCOCCUS OTITIS CORYNEBACTERIUM PSEUDODIPHTHERIAE Standardized susceptibility testing for this organism is not available. Performed at [facility], [address] [place], [place] [address]    Report Status 11/03/2024 FINAL  Final  Blood culture (routine x 2)     Status: Abnormal   Collection Time: 10/30/24  1:49 AM   Specimen: BLOOD LEFT HAND  Result Value Ref Range Status   Specimen Description   Final    BLOOD LEFT HAND Performed at [facility], [address], [place], [place] [address]    Special Requests   Final    BOTTLES DRAWN AEROBIC AND ANAEROBIC Blood Culture results may not be optimal due to an inadequate volume of blood received in culture bottles Performed at [facility], [address] [place], [place] [address]    Culture  Setup Time   Final    GRAM POSITIVE COCCI ANAEROBIC BOTTLE ONLY CRITICAL VALUE NOTED.  VALUE IS CONSISTENT WITH  PREVIOUSLY REPORTED AND CALLED VALUE. GRAM POSITIVE RODS AEROBIC BOTTLE ONLY CRITICAL RESULT CALLED TO, READ BACK BY AND VERIFIED WITH: PHARMD NICK.G AT 0630 ON 10/31/2024 BY T.SAAD.    Culture (A)  Final    ALLOIOCOCCUS OTITIS Standardized susceptibility testing for this organism is not available. GRAM POSITIVE RODS CALL MICROBIOLOGY LAB IF SENSITIVITIES ARE REQUIRED. Performed at [facility], [address] [place], [place] [address]    Report Status 11/05/2024 FINAL  Final  Blood Culture ID Panel (Reflexed)     Status: None   Collection Time: 10/30/24  1:49 AM  Result Value Ref Range Status   Enterococcus faecalis NOT DETECTED NOT DETECTED Final   Enterococcus Faecium NOT DETECTED NOT DETECTED Final   Listeria monocytogenes NOT DETECTED NOT DETECTED Final   Staphylococcus species NOT DETECTED NOT DETECTED Final   Staphylococcus aureus (BCID) NOT DETECTED NOT DETECTED Final   Staphylococcus epidermidis NOT DETECTED NOT DETECTED Final   Staphylococcus lugdunensis NOT DETECTED NOT DETECTED Final   Streptococcus species NOT DETECTED NOT DETECTED Final   Streptococcus agalactiae NOT DETECTED NOT DETECTED Final   Streptococcus pneumoniae NOT DETECTED NOT DETECTED Final  Streptococcus pyogenes NOT DETECTED NOT DETECTED Final   A.calcoaceticus-baumannii NOT DETECTED NOT DETECTED Final   Bacteroides fragilis NOT DETECTED NOT DETECTED Final   Enterobacterales NOT DETECTED NOT DETECTED Final   Enterobacter cloacae complex NOT DETECTED NOT DETECTED Final   Escherichia coli NOT DETECTED NOT DETECTED Final   Klebsiella aerogenes NOT DETECTED NOT DETECTED Final   Klebsiella oxytoca NOT DETECTED NOT DETECTED Final   Klebsiella pneumoniae NOT DETECTED NOT DETECTED Final   Proteus species NOT DETECTED NOT DETECTED Final   Salmonella species NOT DETECTED NOT DETECTED Final   Serratia marcescens NOT DETECTED NOT DETECTED Final   Haemophilus influenzae NOT DETECTED NOT DETECTED Final    Neisseria meningitidis NOT DETECTED NOT DETECTED Final   Pseudomonas aeruginosa NOT DETECTED NOT DETECTED Final   Stenotrophomonas maltophilia NOT DETECTED NOT DETECTED Final   Candida albicans NOT DETECTED NOT DETECTED Final   Candida auris NOT DETECTED NOT DETECTED Final   Candida glabrata NOT DETECTED NOT DETECTED Final   Candida krusei NOT DETECTED NOT DETECTED Final   Candida parapsilosis NOT DETECTED NOT DETECTED Final   Candida tropicalis NOT DETECTED NOT DETECTED Final   Cryptococcus neoformans/gattii NOT DETECTED NOT DETECTED Final    Comment: Performed at [facility], [address], [place], [place] [address]  MRSA Next Gen by PCR, Nasal     Status: None   Collection Time: 10/30/24  2:07 PM   Specimen: Nasal Mucosa; Nasal Swab  Result Value Ref Range Status   MRSA by PCR Next Gen NOT DETECTED NOT DETECTED Final    Comment: (NOTE) The GeneXpert MRSA Assay (FDA approved for NASAL specimens only), is one component of a comprehensive MRSA colonization surveillance program. It is not intended to diagnose MRSA infection nor to guide or monitor treatment for MRSA infections. Test performance is not FDA approved in patients less than 34 years old. Performed at [facility], [address], [place], [place] [address]   MRSA Next Gen by PCR, Nasal     Status: None   Collection Time: 11/04/24 12:41 PM   Specimen: Nasal Mucosa; Nasal Swab  Result Value Ref Range Status   MRSA by PCR Next Gen NOT DETECTED NOT DETECTED Final    Comment: (NOTE) The GeneXpert MRSA Assay (FDA approved for NASAL specimens only), is one component of a comprehensive MRSA colonization surveillance program. It is not intended to diagnose MRSA infection nor to guide or monitor treatment for MRSA infections. Test performance is not FDA approved in patients less than 84 years old. Performed at [facility], [address] [place], [place] [address]   Culture,  blood (Routine X 2) w Reflex to ID Panel     Status: None (Preliminary result)   Collection Time: 11/04/24  2:20 PM   Specimen: BLOOD  Result Value Ref Range Status   Specimen Description   Final    BLOOD BLOOD LEFT HAND Performed at [facility], [address], [place], [place] [address]    Special Requests   Final    BOTTLES DRAWN AEROBIC AND ANAEROBIC Blood Culture results may not be optimal due to an inadequate volume of blood received in culture bottles Performed at [facility], [address], [place], [place] [address]    Culture   Final    NO GROWTH 4 DAYS Performed at [facility], [address] [place], [place] [address]    Report Status PENDING  Incomplete  Culture,  blood (Routine X 2) w Reflex to ID Panel     Status: None (Preliminary result)   Collection Time: 11/04/24  2:29 PM   Specimen: BLOOD  Result Value Ref Range Status   Specimen Description   Final    BLOOD BLOOD RIGHT HAND Performed at [facility], [address] [place], [place] [address]    Special Requests   Final    BOTTLES DRAWN AEROBIC AND ANAEROBIC Blood Culture adequate volume Performed at [facility], [address], [place], [place] [address]    Culture   Final    NO GROWTH 4 DAYS Performed at [facility], [address] [place], [place] [address]    Report Status PENDING  Incomplete    Labs: CBC: Recent Labs  Lab 11/02/24 0313 11/04/24 0542 11/08/24 0601  WBC 4.4 3.5* 4.6  HGB 10.4* 10.6* 11.5*  HCT 31.4* 31.6* 35.2*  MCV 91.3 91.9 92.9  PLT 90* 160 611*   Basic Metabolic Panel: Recent Labs  Lab 11/02/24 0313 11/04/24 0542 11/08/24 0601  NA 133* 136 133*  K 3.6 3.9 4.6  CL 94* 98 96*  CO2 27 26 24   GLUCOSE 96 109* 103*  BUN <5* <5* 10  CREATININE 0.31* 0.47* 0.60*  CALCIUM  8.7* 9.5 9.8  MG 1.8  --   --    Liver Function Tests: No results for input(s): AST, ALT,  ALKPHOS, BILITOT, PROT, ALBUMIN in the last 168 hours. CBG: No results for input(s): GLUCAP in the last 168 hours.  Discharge time spent: greater than 30 minutes.  Signed: Toribio Door, MD [facility] 11/08/2024 "

## 2024-11-08 NOTE — Progress Notes (Signed)
 Discharge meds in a secure bag delivered to patient by this RN

## 2024-11-08 NOTE — TOC Transition Note (Signed)
 Transition of Care [facility]) - Discharge Note   Patient Details  Name: Gary [name] MRN: [id number] Date of Birth: 09-29-1986  [facility]) CM/SW Contact:  Bascom Service, RN Phone Number: 11/08/2024, 1:55 PM   Clinical Narrative: d/c Homeless shelter resources on AVS, bus pass if needed.Has insurance,has PCP,pharmacy.      Final next level of care: Homeless Shelter Barriers to Discharge: No Barriers Identified   Patient Goals and CMS Choice Patient states their goals for this hospitalization and ongoing recovery are:: Shelter CMS Medicare.gov Compare Post Acute Care list provided to:: Patient Choice offered to / list presented to : Patient [place] ownership interest in [facility].provided to:: Patient    Discharge Placement                       Discharge Plan and Services Additional resources added to the After Visit Summary for     Discharge Planning Services: CM Consult Post Acute Care Choice: Resumption of Svcs/PTA Provider                               Social Drivers of Health (SDOH) Interventions SDOH Screenings   Food Insecurity: Food Insecurity Present (10/31/2024)  Housing: High Risk (10/31/2024)  Transportation Needs: Unmet Transportation Needs (10/31/2024)  Utilities: Not At Risk (10/31/2024)  Alcohol  Screen: High Risk (04/17/2024)  Depression (PHQ2-9): Medium Risk (01/28/2024)  Financial Resource Strain: Patient Declined (10/11/2023)  Social Connections: Patient Declined (04/18/2024)  Recent Concern: Social Connections - Socially Isolated (02/03/2024)  Stress: No Stress Concern Present (06/30/2022)   Received from [facility]  Recent Concern: Stress - Stress Concern Present (06/25/2022)   Received from [facility]  Tobacco Use: High Risk (10/30/2024)     Readmission Risk Interventions    05/26/2024   11:12 AM 02/14/2024    3:10 PM 12/15/2023   11:23 AM  Readmission Risk Prevention Plan  Transportation Screening  Complete Complete Complete  Medication Review Oceanographer) Complete Complete Complete  PCP or Specialist appointment within 3-5 days of discharge Patient refused Patient refused Complete  HRI or Home Care Consult -- Complete Complete  SW Recovery Care/Counseling Consult  Complete Complete  Palliative Care Screening Not Applicable Not Applicable Not Applicable  Skilled Nursing Facility Not Applicable Not Applicable Not Applicable

## 2024-11-08 NOTE — Plan of Care (Signed)
   Problem: Coping: Goal: Level of anxiety will decrease Outcome: Progressing   Problem: Pain Managment: Goal: General experience of comfort will improve and/or be controlled Outcome: Progressing   Problem: Safety: Goal: Ability to remain free from injury will improve Outcome: Progressing

## 2024-11-09 LAB — CULTURE, BLOOD (ROUTINE X 2)
Culture: NO GROWTH
Culture: NO GROWTH
Special Requests: ADEQUATE

## 2024-11-12 ENCOUNTER — Emergency Department (HOSPITAL_COMMUNITY): Admission: EM | Admit: 2024-11-12 | Payer: MEDICAID | Source: Home / Self Care

## 2024-11-12 ENCOUNTER — Encounter (HOSPITAL_COMMUNITY): Payer: Self-pay | Admitting: *Deleted

## 2024-11-12 ENCOUNTER — Other Ambulatory Visit: Payer: Self-pay

## 2024-11-12 DIAGNOSIS — Y906 Blood alcohol level of 120-199 mg/100 ml: Secondary | ICD-10-CM | POA: Diagnosis not present

## 2024-11-12 DIAGNOSIS — F10120 Alcohol abuse with intoxication, uncomplicated: Secondary | ICD-10-CM | POA: Insufficient documentation

## 2024-11-12 DIAGNOSIS — R109 Unspecified abdominal pain: Secondary | ICD-10-CM | POA: Diagnosis not present

## 2024-11-12 DIAGNOSIS — R1110 Vomiting, unspecified: Secondary | ICD-10-CM | POA: Diagnosis not present

## 2024-11-12 DIAGNOSIS — Z5321 Procedure and treatment not carried out due to patient leaving prior to being seen by health care provider: Secondary | ICD-10-CM | POA: Diagnosis not present

## 2024-11-12 LAB — COMPREHENSIVE METABOLIC PANEL WITH GFR
ALT: 28 U/L (ref 0–44)
AST: 34 U/L (ref 15–41)
Albumin: 3.9 g/dL (ref 3.5–5.0)
Alkaline Phosphatase: 76 U/L (ref 38–126)
Anion gap: 15 (ref 5–15)
BUN: 8 mg/dL (ref 6–20)
CO2: 22 mmol/L (ref 22–32)
Calcium: 8.5 mg/dL — ABNORMAL LOW (ref 8.9–10.3)
Chloride: 101 mmol/L (ref 98–111)
Creatinine, Ser: 0.66 mg/dL (ref 0.61–1.24)
GFR, Estimated: >60 mL/min
Glucose, Bld: 104 mg/dL — ABNORMAL HIGH (ref 70–99)
Potassium: 3.5 mmol/L (ref 3.5–5.1)
Sodium: 139 mmol/L (ref 135–145)
Total Bilirubin: <0.2 mg/dL (ref 0.0–1.2)
Total Protein: 6.9 g/dL (ref 6.5–8.1)

## 2024-11-12 LAB — LIPASE, BLOOD: Lipase: 64 U/L — ABNORMAL HIGH (ref 11–51)

## 2024-11-12 LAB — ETHANOL: Alcohol, Ethyl (B): 186 mg/dL — ABNORMAL HIGH

## 2024-11-12 NOTE — ED Triage Notes (Signed)
 Pt arrived with EMS for alcohol  intoxication. Recent pneumonia. 22 g left wrist, given 4mg  zofran ; ems vs 108/73,

## 2024-11-12 NOTE — ED Provider Triage Note (Signed)
 Emergency Medicine Provider Triage Evaluation Note  Gary [name] , a 39 y.o. male  was evaluated in triage.  Pt complains of alcohol  intoxication.  Well known to the ED for same.  Review of Systems  Positive: Alcohol  intoxication Negative: vomiting  Physical Exam  BP 103/72   Pulse (!) 101   Temp 98.2 F (36.8 C)   Resp (!) 22   SpO2 98%  Gen:   Awake, no distress   Resp:  Normal effort  MSK:   Moves extremities without difficulty  Other:    Medical Decision Making  Medically screening exam initiated at 11:13 PM.  Appropriate orders placed.  Gary [name] was informed that the remainder of the evaluation will be completed by another provider, this initial triage assessment does not replace that evaluation, and the importance of remaining in the ED until their evaluation is complete.  EtOH, hx of same.  VSS on arrival.  Labs sent.   Jarold Olam HERO, PA-C 11/12/24 2314

## 2024-11-13 LAB — CBC
HCT: 32.4 % — ABNORMAL LOW (ref 39.0–52.0)
Hemoglobin: 10.4 g/dL — ABNORMAL LOW (ref 13.0–17.0)
MCH: 30.4 pg (ref 26.0–34.0)
MCHC: 32.1 g/dL (ref 30.0–36.0)
MCV: 94.7 fL (ref 80.0–100.0)
Platelets: 737 K/uL — ABNORMAL HIGH (ref 150–400)
RBC: 3.42 MIL/uL — ABNORMAL LOW (ref 4.22–5.81)
RDW: 16.6 % — ABNORMAL HIGH (ref 11.5–15.5)
WBC: 7.1 K/uL (ref 4.0–10.5)
nRBC: 0 % (ref 0.0–0.2)

## 2024-11-13 NOTE — ED Notes (Signed)
Patient left due to long wait.

## 2024-11-14 ENCOUNTER — Encounter (HOSPITAL_COMMUNITY): Payer: Self-pay

## 2024-11-14 ENCOUNTER — Other Ambulatory Visit: Payer: Self-pay

## 2024-11-14 ENCOUNTER — Emergency Department (HOSPITAL_COMMUNITY)
Admission: EM | Admit: 2024-11-14 | Discharge: 2024-11-14 | Disposition: A | Discharge status: Home / Self Care | Payer: MEDICAID | Attending: Emergency Medicine | Admitting: Emergency Medicine

## 2024-11-14 DIAGNOSIS — F10920 Alcohol use, unspecified with intoxication, uncomplicated: Secondary | ICD-10-CM | POA: Diagnosis not present

## 2024-11-14 DIAGNOSIS — Y908 Blood alcohol level of 240 mg/100 ml or more: Secondary | ICD-10-CM | POA: Diagnosis not present

## 2024-11-14 DIAGNOSIS — K2920 Alcoholic gastritis without bleeding: Secondary | ICD-10-CM | POA: Insufficient documentation

## 2024-11-14 DIAGNOSIS — R1013 Epigastric pain: Secondary | ICD-10-CM

## 2024-11-14 DIAGNOSIS — R109 Unspecified abdominal pain: Secondary | ICD-10-CM | POA: Diagnosis present

## 2024-11-14 LAB — CBC WITH DIFFERENTIAL/PLATELET
Abs Immature Granulocytes: 0.01 K/uL (ref 0.00–0.07)
Basophils Absolute: 0.1 K/uL (ref 0.0–0.1)
Basophils Relative: 2 %
Eosinophils Absolute: 0.1 K/uL (ref 0.0–0.5)
Eosinophils Relative: 2 %
HCT: 34.6 % — ABNORMAL LOW (ref 39.0–52.0)
Hemoglobin: 11.6 g/dL — ABNORMAL LOW (ref 13.0–17.0)
Immature Granulocytes: 0 %
Lymphocytes Relative: 49 %
Lymphs Abs: 2.2 K/uL (ref 0.7–4.0)
MCH: 31 pg (ref 26.0–34.0)
MCHC: 33.5 g/dL (ref 30.0–36.0)
MCV: 92.5 fL (ref 80.0–100.0)
Monocytes Absolute: 0.3 K/uL (ref 0.1–1.0)
Monocytes Relative: 7 %
Neutro Abs: 1.8 K/uL (ref 1.7–7.7)
Neutrophils Relative %: 40 %
Platelets: 676 K/uL — ABNORMAL HIGH (ref 150–400)
RBC: 3.74 MIL/uL — ABNORMAL LOW (ref 4.22–5.81)
RDW: 16.3 % — ABNORMAL HIGH (ref 11.5–15.5)
WBC: 4.6 K/uL (ref 4.0–10.5)
nRBC: 0 % (ref 0.0–0.2)

## 2024-11-14 LAB — COMPREHENSIVE METABOLIC PANEL WITH GFR
ALT: 29 U/L (ref 0–44)
AST: 35 U/L (ref 15–41)
Albumin: 4.2 g/dL (ref 3.5–5.0)
Alkaline Phosphatase: 88 U/L (ref 38–126)
Anion gap: 13 (ref 5–15)
BUN: <5 mg/dL — ABNORMAL LOW (ref 6–20)
CO2: 25 mmol/L (ref 22–32)
Calcium: 8.8 mg/dL — ABNORMAL LOW (ref 8.9–10.3)
Chloride: 101 mmol/L (ref 98–111)
Creatinine, Ser: 0.62 mg/dL (ref 0.61–1.24)
GFR, Estimated: >60 mL/min
Glucose, Bld: 94 mg/dL (ref 70–99)
Potassium: 3.8 mmol/L (ref 3.5–5.1)
Sodium: 139 mmol/L (ref 135–145)
Total Bilirubin: <0.2 mg/dL (ref 0.0–1.2)
Total Protein: 7.6 g/dL (ref 6.5–8.1)

## 2024-11-14 LAB — ETHANOL: Alcohol, Ethyl (B): 369 mg/dL

## 2024-11-14 LAB — LIPASE, BLOOD: Lipase: 57 U/L — ABNORMAL HIGH (ref 11–51)

## 2024-11-14 MED ORDER — ALUM & MAG HYDROXIDE-SIMETH 200-200-20 MG/5ML PO SUSP
30.0000 mL | Freq: Once | ORAL | Status: AC
  Administered 2024-11-14: 30 mL via ORAL
  Filled 2024-11-14: qty 30

## 2024-11-14 MED ORDER — PANTOPRAZOLE SODIUM 40 MG IV SOLR
40.0000 mg | Freq: Once | INTRAVENOUS | Status: AC
  Administered 2024-11-14: 40 mg via INTRAVENOUS
  Filled 2024-11-14: qty 10

## 2024-11-14 MED ORDER — ONDANSETRON 4 MG PO TBDP
4.0000 mg | ORAL_TABLET | Freq: Once | ORAL | Status: AC
  Administered 2024-11-14: 4 mg via ORAL
  Filled 2024-11-14: qty 1

## 2024-11-14 MED ORDER — SODIUM CHLORIDE 0.9 % IV BOLUS
1000.0000 mL | Freq: Once | INTRAVENOUS | Status: AC
  Administered 2024-11-14: 1000 mL via INTRAVENOUS

## 2024-11-14 NOTE — ED Triage Notes (Signed)
 [facility] streets with recurrent abdominal pain after drinking 2 bottles of wine.

## 2024-11-14 NOTE — Discharge Instructions (Signed)
 Return to the ED as needed if symptoms worsen.

## 2024-11-14 NOTE — ED Provider Notes (Signed)
 " [place] EMERGENCY DEPARTMENT AT [facility] Provider Note   CSN: [id number] Arrival date & time: 11/14/24  0028     Patient presents with: Abdominal Pain   Gary [name] is a 39 y.o. male.   Patient to ED by EMS c/o abdominal pain. Has been drinking wine, reporting 2 bottles. History of pancreatitis. No fever. No hematemesis. On arrival he is sleeping, strong odor of alcohol . VSS.   The history is provided by the patient and the EMS personnel. No language interpreter was used.  Abdominal Pain      Prior to Admission medications  Medication Sig Start Date End Date Taking? Authorizing Provider  folic acid  (FOLVITE ) 1 MG tablet Take 1 tablet (1 mg total) by mouth daily. 11/09/24   Jadine Toribio SHAUNNA, MD  levETIRAcetam  (KEPPRA ) 500 MG tablet Take 1 tablet (500 mg total) by mouth 2 (two) times daily. 11/08/24   Jadine Toribio SHAUNNA, MD  lipase/protease/amylase (CREON ) 36000 UNITS CPEP capsule Take 1 capsule (36,000 Units total) by mouth 3 (three) times daily before meals. 11/08/24   Jadine Toribio SHAUNNA, MD  Multiple Vitamin (MULTIVITAMIN WITH MINERALS) TABS tablet Take 1 tablet by mouth daily. Patient not taking: Reported on 10/30/2024 10/12/24   [place] Kandi BROCKS, MD  thiamine  (VITAMIN B-1) 100 MG tablet Take 1 tablet (100 mg total) by mouth daily. 11/09/24   Jadine Toribio SHAUNNA, MD  famotidine  (PEPCID ) 20 MG tablet Take 1 tablet (20 mg total) by mouth 2 (two) times daily. Patient not taking: Reported on 06/01/2019 05/14/19 06/02/19  [name], MD    Allergies: Risperidone  and paliperidone, Tegretol  [carbamazepine ], and Caffeine    Review of Systems  Gastrointestinal:  Positive for abdominal pain.    Updated Vital Signs BP 115/62 (BP Location: Left Arm)   Pulse (!) 109   Temp 97.8 F (36.6 C) (Oral)   Resp 19   SpO2 95%   Physical Exam Vitals and nursing note reviewed.  Constitutional:      Appearance: He is well-developed.     Comments: Acutely intoxicated.    HENT:     Head: Normocephalic.  Cardiovascular:     Rate and Rhythm: Normal rate and regular rhythm.  Pulmonary:     Effort: Pulmonary effort is normal.     Breath sounds: Normal breath sounds. No wheezing, rhonchi or rales.  Abdominal:     Palpations: Abdomen is soft.     Tenderness: There is no guarding or rebound.  Musculoskeletal:        General: Normal range of motion.     Cervical back: Normal range of motion and neck supple.  Skin:    General: Skin is warm and dry.  Neurological:     Mental Status: He is alert.     (all labs ordered are listed, but only abnormal results are displayed) Labs Reviewed  CBC WITH DIFFERENTIAL/PLATELET - Abnormal; Notable for the following components:      Result Value   RBC 3.74 (*)    Hemoglobin 11.6 (*)    HCT 34.6 (*)    RDW 16.3 (*)    Platelets 676 (*)    All other components within normal limits  COMPREHENSIVE METABOLIC PANEL WITH GFR - Abnormal; Notable for the following components:   BUN <5 (*)    Calcium  8.8 (*)    All other components within normal limits  LIPASE, BLOOD - Abnormal; Notable for the following components:   Lipase 57 (*)    All other  components within normal limits  ETHANOL - Abnormal; Notable for the following components:   Alcohol , Ethyl (B) 369 (*)    All other components within normal limits    EKG: None  Radiology: No results found.   Procedures   Medications Ordered in the ED  sodium chloride  0.9 % bolus 1,000 mL (1,000 mLs Intravenous New Bag/Given 11/14/24 0123)  pantoprazole  (PROTONIX ) injection 40 mg (40 mg Intravenous Given 11/14/24 0207)  alum & mag hydroxide-simeth (MAALOX/MYLANTA) 200-200-20 MG/5ML suspension 30 mL (30 mLs Oral Given 11/14/24 0207)    Clinical Course as of 11/14/24 0501  Tue Nov 14, 2024  0227 Patient to ED w/complaint to EMS of abdominal pain. Sleeping on arrival. ETOH 369. Will observe.  [SU]  0457 On re-evaluation, IV fluids have been provided. VSS. He is awake, I  feel better. No evidence on labs of recurrent pancreatitis. No fever. No leukocytosis, normal electrolytes, normal renal function. He is oriented. While awake his complaint is I'm just dog tired with all the walking I do and its cold.  Offered food but patient declined. IV Protonix , GI cocktail provided. He is felt appropriate for discharge home.  [SU]    Clinical Course User Index [SU] [name], PA-C                                 Medical Decision Making Amount and/or Complexity of Data Reviewed Labs: ordered.  Risk OTC drugs. Prescription drug management.        Final diagnoses:  Epigastric pain  Acute alcoholic gastritis without hemorrhage  Alcoholic intoxication without complication    ED Discharge Orders     None          [name], PA-C 11/14/24 0501    Theadore Ozell HERO, MD 11/14/24 [phone]  "

## 2024-11-15 ENCOUNTER — Emergency Department (HOSPITAL_COMMUNITY)
Admission: EM | Admit: 2024-11-15 | Discharge: 2024-11-15 | Discharge status: Against Medical Advice | Payer: MEDICAID | Attending: Emergency Medicine | Admitting: Emergency Medicine

## 2024-11-15 ENCOUNTER — Other Ambulatory Visit: Payer: Self-pay

## 2024-11-15 ENCOUNTER — Encounter (HOSPITAL_COMMUNITY): Payer: Self-pay | Admitting: *Deleted

## 2024-11-15 DIAGNOSIS — Z5321 Procedure and treatment not carried out due to patient leaving prior to being seen by health care provider: Secondary | ICD-10-CM | POA: Insufficient documentation

## 2024-11-15 DIAGNOSIS — R109 Unspecified abdominal pain: Secondary | ICD-10-CM | POA: Insufficient documentation

## 2024-11-15 DIAGNOSIS — F10130 Alcohol abuse with withdrawal, uncomplicated: Secondary | ICD-10-CM | POA: Diagnosis not present

## 2024-11-15 NOTE — ED Notes (Signed)
 Seen by Margit PA

## 2024-11-15 NOTE — ED Provider Triage Note (Signed)
 Emergency Medicine Provider Triage Evaluation Note  Gary [name] , a 39 y.o. male  was evaluated in triage.  Pt complains of abd pain, intoxication.  Review of Systems  Positive:  Negative:   Physical Exam  BP 105/77   Pulse 85   Temp 97.8 F (36.6 C) (Oral)   Resp 16   SpO2 100%  Gen:   Awake, no distress   Resp:  Normal effort  MSK:   Moves extremities without difficulty  Other:    Medical Decision Making  Medically screening exam initiated at 12:34 AM.  Appropriate orders placed.  PAL SHELL was informed that the remainder of the evaluation will be completed by another provider, this initial triage assessment does not replace that evaluation, and the importance of remaining in the ED until their evaluation is complete.  Patient returns to ED after visit last night reporting ongoing abdominal pain, continuous drinking.   In NAD, VSS. Stable to wait.    Gary Balls, PA-C 11/15/24 0036

## 2024-11-15 NOTE — ED Triage Notes (Signed)
 Arrives with EMS for ongoing abd pain with alcohol  withdrawals.

## 2024-11-15 NOTE — ED Notes (Signed)
 Pt left the ED. OTF

## 2024-11-17 ENCOUNTER — Emergency Department (HOSPITAL_COMMUNITY): Admission: EM | Admit: 2024-11-17 | Discharge: 2024-11-18 | Disposition: A | Payer: MEDICAID

## 2024-11-17 ENCOUNTER — Other Ambulatory Visit: Payer: Self-pay

## 2024-11-17 ENCOUNTER — Encounter (HOSPITAL_COMMUNITY): Payer: Self-pay

## 2024-11-17 DIAGNOSIS — J111 Influenza due to unidentified influenza virus with other respiratory manifestations: Secondary | ICD-10-CM

## 2024-11-17 DIAGNOSIS — Z5900 Homelessness unspecified: Secondary | ICD-10-CM | POA: Diagnosis not present

## 2024-11-17 DIAGNOSIS — J101 Influenza due to other identified influenza virus with other respiratory manifestations: Secondary | ICD-10-CM | POA: Diagnosis not present

## 2024-11-17 DIAGNOSIS — D649 Anemia, unspecified: Secondary | ICD-10-CM | POA: Insufficient documentation

## 2024-11-17 DIAGNOSIS — Y909 Presence of alcohol in blood, level not specified: Secondary | ICD-10-CM | POA: Diagnosis not present

## 2024-11-17 DIAGNOSIS — Z21 Asymptomatic human immunodeficiency virus [HIV] infection status: Secondary | ICD-10-CM | POA: Diagnosis not present

## 2024-11-17 DIAGNOSIS — M791 Myalgia, unspecified site: Secondary | ICD-10-CM | POA: Diagnosis present

## 2024-11-17 DIAGNOSIS — F419 Anxiety disorder, unspecified: Secondary | ICD-10-CM | POA: Diagnosis not present

## 2024-11-17 DIAGNOSIS — F1910 Other psychoactive substance abuse, uncomplicated: Secondary | ICD-10-CM | POA: Insufficient documentation

## 2024-11-17 DIAGNOSIS — F1010 Alcohol abuse, uncomplicated: Secondary | ICD-10-CM | POA: Diagnosis present

## 2024-11-17 DIAGNOSIS — R7401 Elevation of levels of liver transaminase levels: Secondary | ICD-10-CM | POA: Diagnosis not present

## 2024-11-17 NOTE — ED Triage Notes (Signed)
 Arrives [facility] for concern of inability to refrain from alcohol  and he is feeling weaker than normal.   Legs are feeling weak from walking.

## 2024-11-18 ENCOUNTER — Other Ambulatory Visit: Payer: Self-pay

## 2024-11-18 ENCOUNTER — Emergency Department (HOSPITAL_COMMUNITY)
Admission: EM | Admit: 2024-11-18 | Discharge: 2024-11-19 | Disposition: A | Payer: MEDICAID | Attending: Emergency Medicine | Admitting: Emergency Medicine

## 2024-11-18 ENCOUNTER — Encounter (HOSPITAL_COMMUNITY): Payer: Self-pay | Admitting: Emergency Medicine

## 2024-11-18 DIAGNOSIS — F419 Anxiety disorder, unspecified: Secondary | ICD-10-CM | POA: Insufficient documentation

## 2024-11-18 DIAGNOSIS — Z5900 Homelessness unspecified: Secondary | ICD-10-CM | POA: Insufficient documentation

## 2024-11-18 DIAGNOSIS — Y909 Presence of alcohol in blood, level not specified: Secondary | ICD-10-CM | POA: Insufficient documentation

## 2024-11-18 DIAGNOSIS — Z21 Asymptomatic human immunodeficiency virus [HIV] infection status: Secondary | ICD-10-CM | POA: Insufficient documentation

## 2024-11-18 DIAGNOSIS — F1010 Alcohol abuse, uncomplicated: Secondary | ICD-10-CM | POA: Insufficient documentation

## 2024-11-18 LAB — COMPREHENSIVE METABOLIC PANEL WITH GFR
ALT: 48 U/L — ABNORMAL HIGH (ref 0–44)
AST: 77 U/L — ABNORMAL HIGH (ref 15–41)
Albumin: 3.8 g/dL (ref 3.5–5.0)
Alkaline Phosphatase: 114 U/L (ref 38–126)
Anion gap: 12 (ref 5–15)
BUN: 5 mg/dL — ABNORMAL LOW (ref 6–20)
CO2: 28 mmol/L (ref 22–32)
Calcium: 8.1 mg/dL — ABNORMAL LOW (ref 8.9–10.3)
Chloride: 103 mmol/L (ref 98–111)
Creatinine, Ser: 0.56 mg/dL — ABNORMAL LOW (ref 0.61–1.24)
GFR, Estimated: >60 mL/min
Glucose, Bld: 94 mg/dL (ref 70–99)
Potassium: 3.6 mmol/L (ref 3.5–5.1)
Sodium: 142 mmol/L (ref 135–145)
Total Bilirubin: <0.2 mg/dL (ref 0.0–1.2)
Total Protein: 6.8 g/dL (ref 6.5–8.1)

## 2024-11-18 LAB — CBC WITH DIFFERENTIAL/PLATELET
Basophils Absolute: 0 K/uL (ref 0.0–0.1)
Basophils Relative: 0 %
Eosinophils Absolute: 0 K/uL (ref 0.0–0.5)
Eosinophils Relative: 0 %
HCT: 33.2 % — ABNORMAL LOW (ref 39.0–52.0)
Hemoglobin: 11 g/dL — ABNORMAL LOW (ref 13.0–17.0)
Lymphocytes Relative: 43 %
Lymphs Abs: 1.1 K/uL (ref 0.7–4.0)
MCH: 30.3 pg (ref 26.0–34.0)
MCHC: 33.1 g/dL (ref 30.0–36.0)
MCV: 91.5 fL (ref 80.0–100.0)
Monocytes Absolute: 0.1 K/uL (ref 0.1–1.0)
Monocytes Relative: 4 %
Neutro Abs: 1.3 K/uL — ABNORMAL LOW (ref 1.7–7.7)
Neutrophils Relative %: 53 %
Platelets: 243 K/uL (ref 150–400)
RBC: 3.63 MIL/uL — ABNORMAL LOW (ref 4.22–5.81)
RDW: 16 % — ABNORMAL HIGH (ref 11.5–15.5)
Smear Review: NORMAL
WBC: 2.5 K/uL — ABNORMAL LOW (ref 4.0–10.5)
nRBC: 0 % (ref 0.0–0.2)

## 2024-11-18 LAB — RESP PANEL BY RT-PCR (RSV, FLU A&B, COVID)  RVPGX2
Influenza A by PCR: POSITIVE — AB
Influenza B by PCR: NEGATIVE
Resp Syncytial Virus by PCR: NEGATIVE
SARS Coronavirus 2 by RT PCR: NEGATIVE

## 2024-11-18 LAB — CK: Total CK: 139 U/L (ref 49–397)

## 2024-11-18 LAB — LIPASE, BLOOD: Lipase: 51 U/L (ref 11–51)

## 2024-11-18 MED ORDER — FOLIC ACID 1 MG PO TABS: 1.0000 mg | ORAL_TABLET | Freq: Every day | ORAL | Status: DC

## 2024-11-18 MED ORDER — CHLORDIAZEPOXIDE HCL 25 MG PO CAPS
25.0000 mg | ORAL_CAPSULE | Freq: Once | ORAL | Status: AC
  Administered 2024-11-18: 25 mg via ORAL
  Filled 2024-11-18: qty 1

## 2024-11-18 MED ORDER — THIAMINE MONONITRATE 100 MG PO TABS: 100.0000 mg | ORAL_TABLET | Freq: Every day | ORAL | Status: DC

## 2024-11-18 MED ORDER — LORAZEPAM 1 MG PO TABS: 1.0000 mg | ORAL_TABLET | ORAL | Status: DC | PRN

## 2024-11-18 MED ORDER — KETOROLAC TROMETHAMINE 15 MG/ML IJ SOLN: 15.0000 mg | Freq: Once | INTRAMUSCULAR | Status: DC

## 2024-11-18 MED ORDER — LEVETIRACETAM 500 MG PO TABS
500.0000 mg | ORAL_TABLET | Freq: Two times a day (BID) | ORAL | Status: DC
  Administered 2024-11-18: 500 mg via ORAL
  Filled 2024-11-18: qty 1

## 2024-11-18 MED ORDER — SODIUM CHLORIDE 0.9 % IV BOLUS
1000.0000 mL | Freq: Once | INTRAVENOUS | Status: AC
  Administered 2024-11-18: 1000 mL via INTRAVENOUS

## 2024-11-18 MED ORDER — LORAZEPAM 2 MG/ML IJ SOLN: 1.0000 mg | INTRAMUSCULAR | Status: DC | PRN

## 2024-11-18 MED ORDER — THIAMINE HCL 100 MG/ML IJ SOLN: 100.0000 mg | Freq: Every day | INTRAMUSCULAR | Status: DC

## 2024-11-18 MED ORDER — ADULT MULTIVITAMIN W/MINERALS CH: 1.0000 | ORAL_TABLET | Freq: Every day | ORAL | Status: DC

## 2024-11-18 NOTE — ED Provider Notes (Signed)
" [place] EMERGENCY DEPARTMENT AT [facility] Provider Note   CSN: [id number] Arrival date & time: 11/17/24  2239     Patient presents with: Alcohol  Intoxication   Gary [name] is a 39 y.o. male.   This is a 39 year old presenting with multiple complaints.  He seems to be primarily concerned with generalized malaise, body aches simply worse in his legs.  Also complaining of pancreatitis type pain although not significantly worse than his typical pain.  He does feel he wants some help with alcohol  abuse.  He had last had a pint of vodka yesterday while he was still daylight.  He thinks he may be starting to withdrawal currently.   Alcohol  Intoxication       Prior to Admission medications  Medication Sig Start Date End Date Taking? Authorizing Provider  folic acid  (FOLVITE ) 1 MG tablet Take 1 tablet (1 mg total) by mouth daily. 11/09/24   Jadine Toribio SHAUNNA, MD  levETIRAcetam  (KEPPRA ) 500 MG tablet Take 1 tablet (500 mg total) by mouth 2 (two) times daily. 11/08/24   Jadine Toribio SHAUNNA, MD  lipase/protease/amylase (CREON ) 36000 UNITS CPEP capsule Take 1 capsule (36,000 Units total) by mouth 3 (three) times daily before meals. 11/08/24   Jadine Toribio SHAUNNA, MD  Multiple Vitamin (MULTIVITAMIN WITH MINERALS) TABS tablet Take 1 tablet by mouth daily. Patient not taking: Reported on 10/30/2024 10/12/24   [place] Kandi BROCKS, MD  thiamine  (VITAMIN B-1) 100 MG tablet Take 1 tablet (100 mg total) by mouth daily. 11/09/24   Jadine Toribio SHAUNNA, MD  famotidine  (PEPCID ) 20 MG tablet Take 1 tablet (20 mg total) by mouth 2 (two) times daily. Patient not taking: Reported on 06/01/2019 05/14/19 06/02/19  [name], MD    Allergies: Risperidone  and paliperidone, Tegretol  [carbamazepine ], and Caffeine    Review of Systems  Updated Vital Signs BP 108/79 (BP Location: Left Arm)   Pulse 98   Temp 98.4 F (36.9 C) (Oral)   Resp 17   SpO2 95%   Physical Exam Vitals and nursing note  reviewed.  Constitutional:      General: He is not in acute distress.    Appearance: He is not toxic-appearing.  HENT:     Head: Normocephalic and atraumatic.     Nose: Nose normal.     Mouth/Throat:     Mouth: Mucous membranes are moist.  Eyes:     Conjunctiva/sclera: Conjunctivae normal.     Pupils: Pupils are equal, round, and reactive to light.  Cardiovascular:     Rate and Rhythm: Normal rate and regular rhythm.     Pulses: Normal pulses.  Pulmonary:     Effort: Pulmonary effort is normal.     Breath sounds: Normal breath sounds.  Abdominal:     General: Abdomen is flat. There is no distension.     Tenderness: There is abdominal tenderness. There is no guarding or rebound.  Musculoskeletal:        General: No tenderness or deformity.     Right lower leg: No edema.     Left lower leg: No edema.  Skin:    General: Skin is warm and dry.     Capillary Refill: Capillary refill takes less than 2 seconds.  Neurological:     General: No focal deficit present.     Mental Status: He is alert and oriented to person, place, and time.  Psychiatric:        Mood and Affect: Mood normal.  Behavior: Behavior normal.     (all labs ordered are listed, but only abnormal results are displayed) Labs Reviewed  RESP PANEL BY RT-PCR (RSV, FLU A&B, COVID)  RVPGX2 - Abnormal; Notable for the following components:      Result Value   Influenza A by PCR POSITIVE (*)    All other components within normal limits  CBC WITH DIFFERENTIAL/PLATELET - Abnormal; Notable for the following components:   WBC 2.5 (*)    RBC 3.63 (*)    Hemoglobin 11.0 (*)    HCT 33.2 (*)    RDW 16.0 (*)    Neutro Abs 1.3 (*)    All other components within normal limits  COMPREHENSIVE METABOLIC PANEL WITH GFR - Abnormal; Notable for the following components:   BUN 5 (*)    Creatinine, Ser 0.56 (*)    Calcium  8.1 (*)    AST 77 (*)    ALT 48 (*)    All other components within normal limits  LIPASE, BLOOD  CK     EKG: None  Radiology: No results found.   Procedures   Medications Ordered in the ED  levETIRAcetam  (KEPPRA ) tablet 500 mg (500 mg Oral Given 11/18/24 1029)  chlordiazePOXIDE  (LIBRIUM ) capsule 25 mg (25 mg Oral Given 11/18/24 0831)  sodium chloride  0.9 % bolus 1,000 mL (0 mLs Intravenous Stopped 11/18/24 1000)    Clinical Course as of 11/18/24 1452  Sat Nov 18, 2024  0937 CBC with Differential(!) No significant change from prior.  Stable anemia [TY]  0937 Comprehensive metabolic panel(!) No metabolic derangements.  Elevated AST ALT which I suspect is secondary to his alcohol  use.  No elevated bilirubin. [TY]  [id number] Lipase: 51 Pancreatitis unlikely [TY]  0938 CK Total: 139 Does not appear to be in rhabdo [TY]  0953 Influenza A By PCR(!): POSITIVE [TY]  0957 Patient clinically sober, not in DTs.  Feels better after Librium  in terms of withdrawal. He is flu positive which would explain his generalized malaise and weakness.  Encourage patient to follow-up [facility].  Discussed supportive care for the flu.  Discharged in stable condition. [TY]    Clinical Course User Index [TY] Neysa Caron PARAS, DO                                 Medical Decision Making This is a 39 year old male history of HIV, seizures, bipolar, schizophrenia, alcohol  abuse, presented to the emergency department with multiple complaints.  He is afebrile nontachycardic, slightly soft blood pressure.  Nontoxic-appearing, does not appear to be intoxicated, but does not appear to be in overt Dts.  CIWA protocol ordered.  Will give Librium .  He also notes that he is not taking his Keppra  in several days.  Will give dose.  His generalized weakness/pain may be secondary to soft blood pressures versus flu versus malingering.  IV fluids ordered, will screen for flu/COVID.  Reports that he has been walking a lot.  Will get CK to evaluate for rhabdo.  Denies SI, HI.  See ED course for further MDM and disposition.  Amount and/or  Complexity of Data Reviewed Labs: ordered. Decision-making details documented in ED Course.  Risk OTC drugs. Prescription drug management.        Final diagnoses:  Influenza  Polysubstance abuse [facility])    ED Discharge Orders     None          Neysa Caron PARAS, DO 11/18/24  1452 ° °"

## 2024-11-18 NOTE — ED Triage Notes (Signed)
 Pt arrives w/ [facility] w/ c/o ETOH. Picked up from parking lot. States he has been drinking wine all day. Also reports pt has the flu currently.  Pt reports he doesn't want to stop drinking.

## 2024-11-18 NOTE — ED Provider Notes (Signed)
 " [place] EMERGENCY DEPARTMENT AT [facility] Provider Note   CSN: [id number] Arrival date & time: 11/18/24  2034     Patient presents with: Alcohol  Intoxication   Gary [name] is a 39 y.o. male.  {Add pertinent medical, surgical, social history, OB history to YEP:67052}  Alcohol  Intoxication     39 year old male presenting to the emergency department with a chief complaint of alcohol  intoxication.  The patient states that he is homeless.  He has been homeless for the past 11 years.  He has presented to the emergency department chronically for concerns related to alcohol .  He has been prescribed Librium  in the past.  He was seen this morning in the emergency department and diagnosed with the flu and subsequently discharged.  He states that he drank 2 bottles of wine today.  He normally drinks at least 2/5 of alcohol  daily.  He is tearful and is requesting assistance for management of his alcohol  addiction.  He has a desire to quit drinking but has had significant problems in the past doing this.  Significant social barriers to health include his homelessness and known alcohol  addiction.  He feels like he is starting to go into alcohol  withdrawal.  Prior to Admission medications  Medication Sig Start Date End Date Taking? Authorizing Provider  folic acid  (FOLVITE ) 1 MG tablet Take 1 tablet (1 mg total) by mouth daily. 11/09/24   Jadine Toribio SHAUNNA, MD  levETIRAcetam  (KEPPRA ) 500 MG tablet Take 1 tablet (500 mg total) by mouth 2 (two) times daily. 11/08/24   Jadine Toribio SHAUNNA, MD  lipase/protease/amylase (CREON ) 36000 UNITS CPEP capsule Take 1 capsule (36,000 Units total) by mouth 3 (three) times daily before meals. 11/08/24   Jadine Toribio SHAUNNA, MD  Multiple Vitamin (MULTIVITAMIN WITH MINERALS) TABS tablet Take 1 tablet by mouth daily. Patient not taking: Reported on 10/30/2024 10/12/24   [place] Kandi BROCKS, MD  thiamine  (VITAMIN B-1) 100 MG tablet Take 1 tablet (100 mg total)  by mouth daily. 11/09/24   Jadine Toribio SHAUNNA, MD  famotidine  (PEPCID ) 20 MG tablet Take 1 tablet (20 mg total) by mouth 2 (two) times daily. Patient not taking: Reported on 06/01/2019 05/14/19 06/02/19  [name], MD    Allergies: Risperidone  and paliperidone, Tegretol  [carbamazepine ], and Caffeine    Review of Systems  All other systems reviewed and are negative.   Updated Vital Signs There were no vitals taken for this visit.  Physical Exam Vitals and nursing note reviewed.  Constitutional:      General: He is not in acute distress. HENT:     Head: Normocephalic and atraumatic.  Eyes:     Conjunctiva/sclera: Conjunctivae normal.     Pupils: Pupils are equal, round, and reactive to light.  Cardiovascular:     Rate and Rhythm: Normal rate and regular rhythm.  Pulmonary:     Effort: Pulmonary effort is normal. No respiratory distress.  Abdominal:     General: There is no distension.     Tenderness: There is no abdominal tenderness. There is no guarding.  Musculoskeletal:        General: No deformity or signs of injury.     Cervical back: Neck supple.  Skin:    Findings: No lesion or rash.  Neurological:     General: No focal deficit present.     Mental Status: He is alert. Mental status is at baseline.  Psychiatric:        Attention and Perception: Attention and perception  normal.        Mood and Affect: Mood is anxious. Affect is tearful.        Speech: Speech normal.        Thought Content: Thought content does not include suicidal ideation. Thought content does not include suicidal plan.     (all labs ordered are listed, but only abnormal results are displayed) Labs Reviewed - No data to display  EKG: None  Radiology: No results found.  {Document cardiac monitor, telemetry assessment procedure when appropriate:32947} Procedures   Medications Ordered in the ED - No data to display    {Click here for ABCD2, HEART and other calculators REFRESH Note  before signing:1}                              Medical Decision Making  ***  {Document critical care time when appropriate  Document review of labs and clinical decision tools ie CHADS2VASC2, etc  Document your independent review of radiology images and any outside records  Document your discussion with family members, caretakers and with consultants  Document social determinants of health affecting pt's care  Document your decision making why or why not admission, treatments were needed:32947:::1}   Final diagnoses:  None    ED Discharge Orders     None        "

## 2024-11-18 NOTE — Discharge Instructions (Signed)
 You have the flu.  Try to maintain adequate hydration.  You may take over-the-counter medication such as Tylenol  ibuprofen  for fevers and generalized bodyaches.  Please go to the behavioral health urgent care with further help with your substance abuse issues.  Return for any new or worsening symptoms that are concerning to you.

## 2024-11-18 NOTE — ED Notes (Signed)
 Removed a large bottle of margarita mix from pt, placed in triage pt belongings section. Pt currently denies withdrawal symptoms.

## 2024-11-19 ENCOUNTER — Emergency Department (HOSPITAL_COMMUNITY)
Admission: EM | Admit: 2024-11-19 | Discharge: 2024-11-20 | Disposition: A | Payer: MEDICAID | Attending: Emergency Medicine | Admitting: Emergency Medicine

## 2024-11-19 ENCOUNTER — Encounter (HOSPITAL_COMMUNITY): Payer: Self-pay

## 2024-11-19 ENCOUNTER — Other Ambulatory Visit: Payer: Self-pay

## 2024-11-19 DIAGNOSIS — F10120 Alcohol abuse with intoxication, uncomplicated: Secondary | ICD-10-CM | POA: Diagnosis present

## 2024-11-19 DIAGNOSIS — Y908 Blood alcohol level of 240 mg/100 ml or more: Secondary | ICD-10-CM | POA: Diagnosis not present

## 2024-11-19 DIAGNOSIS — F1310 Sedative, hypnotic or anxiolytic abuse, uncomplicated: Secondary | ICD-10-CM | POA: Insufficient documentation

## 2024-11-19 DIAGNOSIS — Z5900 Homelessness unspecified: Secondary | ICD-10-CM | POA: Insufficient documentation

## 2024-11-19 DIAGNOSIS — R7401 Elevation of levels of liver transaminase levels: Secondary | ICD-10-CM | POA: Insufficient documentation

## 2024-11-19 DIAGNOSIS — K852 Alcohol induced acute pancreatitis without necrosis or infection: Secondary | ICD-10-CM | POA: Insufficient documentation

## 2024-11-19 DIAGNOSIS — F10920 Alcohol use, unspecified with intoxication, uncomplicated: Secondary | ICD-10-CM

## 2024-11-19 LAB — COMPREHENSIVE METABOLIC PANEL WITH GFR
ALT: 54 U/L — ABNORMAL HIGH (ref 0–44)
ALT: 61 U/L — ABNORMAL HIGH (ref 0–44)
AST: 100 U/L — ABNORMAL HIGH (ref 15–41)
AST: 90 U/L — ABNORMAL HIGH (ref 15–41)
Albumin: 3.9 g/dL (ref 3.5–5.0)
Albumin: 4.1 g/dL (ref 3.5–5.0)
Alkaline Phosphatase: 122 U/L (ref 38–126)
Alkaline Phosphatase: 142 U/L — ABNORMAL HIGH (ref 38–126)
Anion gap: 13 (ref 5–15)
Anion gap: 14 (ref 5–15)
BUN: <5 mg/dL — ABNORMAL LOW (ref 6–20)
BUN: <5 mg/dL — ABNORMAL LOW (ref 6–20)
CO2: 23 mmol/L (ref 22–32)
CO2: 24 mmol/L (ref 22–32)
Calcium: 8.1 mg/dL — ABNORMAL LOW (ref 8.9–10.3)
Calcium: 8.2 mg/dL — ABNORMAL LOW (ref 8.9–10.3)
Chloride: 102 mmol/L (ref 98–111)
Chloride: 102 mmol/L (ref 98–111)
Creatinine, Ser: 0.48 mg/dL — ABNORMAL LOW (ref 0.61–1.24)
Creatinine, Ser: 0.6 mg/dL — ABNORMAL LOW (ref 0.61–1.24)
GFR, Estimated: >60 mL/min
GFR, Estimated: >60 mL/min
Glucose, Bld: 109 mg/dL — ABNORMAL HIGH (ref 70–99)
Glucose, Bld: 98 mg/dL (ref 70–99)
Potassium: 3.5 mmol/L (ref 3.5–5.1)
Potassium: 3.9 mmol/L (ref 3.5–5.1)
Sodium: 139 mmol/L (ref 135–145)
Sodium: 140 mmol/L (ref 135–145)
Total Bilirubin: 0.2 mg/dL (ref 0.0–1.2)
Total Bilirubin: <0.2 mg/dL (ref 0.0–1.2)
Total Protein: 7.1 g/dL (ref 6.5–8.1)
Total Protein: 7.7 g/dL (ref 6.5–8.1)

## 2024-11-19 LAB — CBC WITH DIFFERENTIAL/PLATELET
Abs Immature Granulocytes: 0 K/uL (ref 0.00–0.07)
Basophils Absolute: 0 K/uL (ref 0.0–0.1)
Basophils Relative: 1 %
Eosinophils Absolute: 0 K/uL (ref 0.0–0.5)
Eosinophils Relative: 1 %
HCT: 38.1 % — ABNORMAL LOW (ref 39.0–52.0)
Hemoglobin: 12.2 g/dL — ABNORMAL LOW (ref 13.0–17.0)
Immature Granulocytes: 0 %
Lymphocytes Relative: 74 %
Lymphs Abs: 2.2 K/uL (ref 0.7–4.0)
MCH: 29.5 pg (ref 26.0–34.0)
MCHC: 32 g/dL (ref 30.0–36.0)
MCV: 92 fL (ref 80.0–100.0)
Monocytes Absolute: 0.1 K/uL (ref 0.1–1.0)
Monocytes Relative: 4 %
Neutro Abs: 0.6 K/uL — ABNORMAL LOW (ref 1.7–7.7)
Neutrophils Relative %: 20 %
Platelets: 198 K/uL (ref 150–400)
RBC: 4.14 MIL/uL — ABNORMAL LOW (ref 4.22–5.81)
RDW: 15.9 % — ABNORMAL HIGH (ref 11.5–15.5)
WBC: 2.9 K/uL — ABNORMAL LOW (ref 4.0–10.5)
nRBC: 0 % (ref 0.0–0.2)

## 2024-11-19 LAB — CBC
HCT: 35.4 % — ABNORMAL LOW (ref 39.0–52.0)
Hemoglobin: 11.5 g/dL — ABNORMAL LOW (ref 13.0–17.0)
MCH: 29.9 pg (ref 26.0–34.0)
MCHC: 32.5 g/dL (ref 30.0–36.0)
MCV: 92.2 fL (ref 80.0–100.0)
Platelets: 250 K/uL (ref 150–400)
RBC: 3.84 MIL/uL — ABNORMAL LOW (ref 4.22–5.81)
RDW: 16.3 % — ABNORMAL HIGH (ref 11.5–15.5)
WBC: 2.3 K/uL — ABNORMAL LOW (ref 4.0–10.5)
nRBC: 0 % (ref 0.0–0.2)

## 2024-11-19 LAB — LIPASE, BLOOD: Lipase: 56 U/L — ABNORMAL HIGH (ref 11–51)

## 2024-11-19 LAB — CBG MONITORING, ED: Glucose-Capillary: 90 mg/dL (ref 70–99)

## 2024-11-19 LAB — ETHANOL: Alcohol, Ethyl (B): 412 mg/dL

## 2024-11-19 LAB — MAGNESIUM: Magnesium: 2 mg/dL (ref 1.7–2.4)

## 2024-11-19 NOTE — ED Provider Notes (Incomplete)
 " [place] EMERGENCY DEPARTMENT AT [facility] Provider Note   CSN: [id number] Arrival date & time: 11/19/24  2026     Patient presents with: Alcohol  Intoxication   Gary [name] is a 39 y.o. male with history of alcohol -induced pancreatitis, AIDS, polysubstance abuse, call intoxication, MDD, neutropenia, homelessness, malingering, seizure disorders.  Patient presents to ED complaining of intoxication.  Patient arrives via EMS.  Apparently patient told triage nurse that he was having auditory hallucinations and SI.  When asked the patient directly if he is having SI, he reports no.  I go on to explain the triage nurse that documented that he did have SI but the patient reports to me he was just saying that.  He reports that he does have HI, states that he wishes to hurt anyone that does not wish to help me.  He denies any drug use.  He does report excessive alcohol  use, states that he has been drinking wine today but he cannot tell me how much.  He reports abdominal pain and nausea.  He has abdominal tenderness on exam.  History of pancreatitis on chart.  He denies any fevers at home.  Unable to give much history as he is heavily intoxicated.   Alcohol  Intoxication       Prior to Admission medications  Medication Sig Start Date End Date Taking? Authorizing Provider  folic acid  (FOLVITE ) 1 MG tablet Take 1 tablet (1 mg total) by mouth daily. 11/09/24   Jadine Toribio SHAUNNA, MD  levETIRAcetam  (KEPPRA ) 500 MG tablet Take 1 tablet (500 mg total) by mouth 2 (two) times daily. 11/08/24   Jadine Toribio SHAUNNA, MD  lipase/protease/amylase (CREON ) 36000 UNITS CPEP capsule Take 1 capsule (36,000 Units total) by mouth 3 (three) times daily before meals. 11/08/24   Jadine Toribio SHAUNNA, MD  Multiple Vitamin (MULTIVITAMIN WITH MINERALS) TABS tablet Take 1 tablet by mouth daily. Patient not taking: Reported on 10/30/2024 10/12/24   [place] Kandi BROCKS, MD  thiamine  (VITAMIN B-1) 100 MG tablet  Take 1 tablet (100 mg total) by mouth daily. 11/09/24   Jadine Toribio SHAUNNA, MD  famotidine  (PEPCID ) 20 MG tablet Take 1 tablet (20 mg total) by mouth 2 (two) times daily. Patient not taking: Reported on 06/01/2019 05/14/19 06/02/19  [name], MD    Allergies: Risperidone  and paliperidone, Tegretol  [carbamazepine ], and Caffeine    Review of Systems  Updated Vital Signs BP 109/80   Pulse 66   Temp 97.8 F (36.6 C) (Oral)   Resp 16   SpO2 98%   Physical Exam  (all labs ordered are listed, but only abnormal results are displayed) Labs Reviewed  COMPREHENSIVE METABOLIC PANEL WITH GFR - Abnormal; Notable for the following components:      Result Value   BUN <5 (*)    Creatinine, Ser 0.48 (*)    Calcium  8.2 (*)    AST 100 (*)    ALT 61 (*)    Alkaline Phosphatase 142 (*)    All other components within normal limits  ETHANOL - Abnormal; Notable for the following components:   Alcohol , Ethyl (B) 412 (*)    All other components within normal limits  CBC WITH DIFFERENTIAL/PLATELET - Abnormal; Notable for the following components:   WBC 2.9 (*)    RBC 4.14 (*)    Hemoglobin 12.2 (*)    HCT 38.1 (*)    RDW 15.9 (*)    Neutro Abs 0.6 (*)    All other components within  normal limits  URINE DRUG SCREEN  CBG MONITORING, ED    EKG: None  Radiology: No results found.  {Document cardiac monitor, telemetry assessment procedure when appropriate:32947} Procedures   Medications Ordered in the ED - No data to display    {Click here for ABCD2, HEART and other calculators REFRESH Note before signing:1}                              Medical Decision Making  ***  {Document critical care time when appropriate  Document review of labs and clinical decision tools ie CHADS2VASC2, etc  Document your independent review of radiology images and any outside records  Document your discussion with family members, caretakers and with consultants  Document social determinants of health  affecting pt's care  Document your decision making why or why not admission, treatments were needed:32947:::1}   Final diagnoses:  None    ED Discharge Orders     None        "

## 2024-11-19 NOTE — ED Provider Notes (Signed)
 I assumed care of this patient from previous provider.  Please see their note for further details of history, exam, and MDM.   Briefly patient is a 39 y.o. male who presented for alc intox. Plan for MTF  Labs reassuring. 5:54 AM Clinically sober.  The patient appears reasonably screened and/or stabilized for discharge and I doubt any other medical condition or other [facility] requiring further screening, evaluation, or treatment in the ED at this time. I have discussed the findings, Dx and Tx plan with the patient/family who expressed understanding and agree(s) with the plan. Discharge instructions discussed at length. The patient/family was given strict return precautions who verbalized understanding of the instructions. No further questions at time of discharge.  Disposition: Discharge  Condition: Good  ED Discharge Orders     None          Keirsten Matuska, Raynell Moder, MD 11/19/24 [phone]

## 2024-11-19 NOTE — ED Notes (Signed)
 EDP notified of critical result eth 412

## 2024-11-19 NOTE — ED Notes (Signed)
Save red tube in main lab 

## 2024-11-19 NOTE — ED Notes (Signed)
 While attempting to ambulate pt, pt states I can't , I just don't feel good

## 2024-11-19 NOTE — ED Provider Notes (Signed)
 "  EMERGENCY DEPARTMENT AT [facility] Provider Note   CSN: [id number] Arrival date & time: 11/19/24  2026     Patient presents with: Alcohol  Intoxication   Gary [name] is a 39 y.o. male with history of alcohol -induced pancreatitis, AIDS, polysubstance abuse, call intoxication, MDD, neutropenia, homelessness, malingering, seizure disorders.  Patient presents to ED complaining of intoxication.  Patient arrives via EMS.  Apparently patient told triage nurse that he was having auditory hallucinations and SI.  When I asked the patient directly if he is having SI, he reports no.  I go on to explain the triage nurse documented that he did have SI but the patient reports to me he was just saying that.  He denies any HI.  He denies any drug use.  He does report excessive alcohol  use, states that he has been drinking wine today but he cannot tell me how much.  He reports abdominal pain and nausea.  He has abdominal tenderness on exam.  History of pancreatitis on chart.  He denies any fevers at home.  Unable to give much history as he is heavily intoxicated.   Alcohol  Intoxication Associated symptoms include abdominal pain.       Prior to Admission medications  Medication Sig Start Date End Date Taking? Authorizing Provider  amoxicillin -clavulanate (AUGMENTIN ) 875-125 MG tablet Take 1 tablet by mouth every 12 (twelve) hours. 11/20/24  Yes Ruthell Lonni FALCON, PA-C  folic acid  (FOLVITE ) 1 MG tablet Take 1 tablet (1 mg total) by mouth daily. 11/09/24   Jadine Toribio SHAUNNA, MD  levETIRAcetam  (KEPPRA ) 500 MG tablet Take 1 tablet (500 mg total) by mouth 2 (two) times daily. 11/08/24   Jadine Toribio SHAUNNA, MD  lipase/protease/amylase (CREON ) 36000 UNITS CPEP capsule Take 1 capsule (36,000 Units total) by mouth 3 (three) times daily before meals. 11/08/24   Jadine Toribio SHAUNNA, MD  Multiple Vitamin (MULTIVITAMIN WITH MINERALS) TABS tablet Take 1 tablet by mouth daily. Patient not  taking: Reported on 10/30/2024 10/12/24   Cole Kandi BROCKS, MD  thiamine  (VITAMIN B-1) 100 MG tablet Take 1 tablet (100 mg total) by mouth daily. 11/09/24   Jadine Toribio SHAUNNA, MD  famotidine  (PEPCID ) 20 MG tablet Take 1 tablet (20 mg total) by mouth 2 (two) times daily. Patient not taking: Reported on 06/01/2019 05/14/19 06/02/19  [name], MD    Allergies: Risperidone  and paliperidone, Tegretol  [carbamazepine ], and Caffeine    Review of Systems  Gastrointestinal:  Positive for abdominal pain.  All other systems reviewed and are negative.   Updated Vital Signs BP 111/76 (BP Location: Right Arm)   Pulse 80   Temp 97.8 F (36.6 C) (Oral)   Resp 20   SpO2 95%   Physical Exam Vitals and nursing note reviewed.  Constitutional:      General: He is not in acute distress.    Appearance: He is well-developed.  HENT:     Head: Normocephalic and atraumatic.  Eyes:     Conjunctiva/sclera: Conjunctivae normal.  Cardiovascular:     Rate and Rhythm: Normal rate and regular rhythm.     Heart sounds: No murmur heard. Pulmonary:     Effort: Pulmonary effort is normal. No respiratory distress.     Breath sounds: Normal breath sounds.  Abdominal:     Palpations: Abdomen is soft.     Tenderness: There is abdominal tenderness.     Comments: Epigastric TTP  Musculoskeletal:        General: No swelling.  Cervical back: Neck supple.  Skin:    General: Skin is warm and dry.     Capillary Refill: Capillary refill takes less than 2 seconds.  Neurological:     Mental Status: He is alert.  Psychiatric:        Mood and Affect: Mood normal.     (all labs ordered are listed, but only abnormal results are displayed) Labs Reviewed  COMPREHENSIVE METABOLIC PANEL WITH GFR - Abnormal; Notable for the following components:      Result Value   BUN <5 (*)    Creatinine, Ser 0.48 (*)    Calcium  8.2 (*)    AST 100 (*)    ALT 61 (*)    Alkaline Phosphatase 142 (*)    All other components  within normal limits  ETHANOL - Abnormal; Notable for the following components:   Alcohol , Ethyl (B) 412 (*)    All other components within normal limits  URINE DRUG SCREEN - Abnormal; Notable for the following components:   Benzodiazepines POSITIVE (*)    Barbiturates POSITIVE (*)    All other components within normal limits  CBC WITH DIFFERENTIAL/PLATELET - Abnormal; Notable for the following components:   WBC 2.9 (*)    RBC 4.14 (*)    Hemoglobin 12.2 (*)    HCT 38.1 (*)    RDW 15.9 (*)    Neutro Abs 0.6 (*)    All other components within normal limits  LIPASE, BLOOD - Abnormal; Notable for the following components:   Lipase 56 (*)    All other components within normal limits  CBG MONITORING, ED    EKG: None  Radiology: CT ABDOMEN PELVIS W CONTRAST Result Date: 11/20/2024 EXAM: CT ABDOMEN AND PELVIS WITH CONTRAST 11/20/2024 01:25:45 AM TECHNIQUE: CT of the abdomen and pelvis was performed with the administration of 100 mL of iohexol  (OMNIPAQUE ) 300 MG/ML solution. Multiplanar reformatted images are provided for review. Automated exposure control, iterative reconstruction, and/or weight-based adjustment of the mA/kV was utilized to reduce the radiation dose to as low as reasonably achievable. COMPARISON: 10/30/2024 CLINICAL HISTORY: Pancreatitis, acute, severe. FINDINGS: LOWER CHEST: Mild subpleural patchy opacity in the right middle and lower lobes, similar to prior, favoring multifocal pneumonia over infectious/inflammatory scarring. LIVER: The liver is unremarkable. GALLBLADDER AND BILE DUCTS: Gallbladder is unremarkable. No biliary ductal dilatation. SPLEEN: No acute abnormality. PANCREAS: Peripancreatic inflammatory changes along the pancreatic head/uncinate process and pancreaticoduodenal groove (image 31), suggesting acute pancreatitis, without complication. ADRENAL GLANDS: No acute abnormality. KIDNEYS, URETERS AND BLADDER: No stones in the kidneys or ureters. No hydronephrosis.  No perinephric or periureteral stranding. Moderately distended bladder. GI AND BOWEL: Stomach demonstrates no acute abnormality. There is no bowel obstruction. Normal appendix. PERITONEUM AND RETROPERITONEUM: No ascites. No free air. VASCULATURE: Aorta is normal in caliber. LYMPH NODES: No lymphadenopathy. REPRODUCTIVE ORGANS: Prostate is unremarkable. BONES AND SOFT TISSUES: Mild degenerative changes of the lower thoracic spine. No acute osseous abnormality. No focal soft tissue abnormality. IMPRESSION: 1. Acute pancreatitis, without complication. 2. Suspected multifocal right lung pneumonia and/or infectious/inflammatory scarring. Electronically signed by: Pinkie Pebbles MD MD 11/20/2024 01:34 AM EST RP Workstation: [id number]    Procedures   Medications Ordered in the ED  iohexol  (OMNIPAQUE ) 300 MG/ML solution 100 mL (100 mLs Intravenous Contrast Given 11/20/24 0123)  ketorolac  (TORADOL ) 15 MG/ML injection 15 mg (15 mg Intravenous Given 11/20/24 0158)  amoxicillin -clavulanate (AUGMENTIN ) 875-125 MG per tablet 1 tablet (1 tablet Oral Given 11/20/24 0158)  sodium chloride  0.9 % bolus 1,000  mL (1,000 mLs Intravenous New Bag/Given 11/20/24 0535)    Clinical Course as of 11/20/24 0557  Mon Nov 20, 2024  0153 Toradol , augmentin  for pneumonia, dc after MTF [CG]    Clinical Course User Index [CG] Ruthell Lonni FALCON, PA-C   Medical Decision Making Amount and/or Complexity of Data Reviewed Radiology: ordered.  Risk Prescription drug management.   39 year old male presents for evaluation of alcohol  intoxication.  On exam, HD stable.  Afebrile and nontachycardic.  Lung sounds clear bilaterally, no hypoxia.  Abdomen with tenderness in epigastric region, no overlying skin change.  Neuroexam at baseline.  Labs collected.  CBC without leukocytosis, no anemia.  Metabolic panel with AST 100, ALT 61, alk phos 142 in the setting of chronic alcohol  use.  Baseline creatinine.  No electrolyte derangement.   Anion gap 13.  UDS positive for benzodiazepines, barbiturates.  Ethanol 412.  Lipase 56.  CT scan obtained which shows acute pancreatitis.  Patient given liter of fluid, Toradol .  Patient resting comfortably.  CT also shows possible suspected multifocal right lung pneumonia and/or infectious and inflammatory scarring.  Given Augmentin  for this.  At this time patient is stable to discharge home.  Had no nausea or vomiting here.  Will send with Augmentin  for pneumonia, Zofran .  Sent to pharmacy on file.  Patient given resources for substance abuse counseling and treatment.  Stable to discharge.    Final diagnoses:  Alcoholic intoxication without complication  Alcohol -induced acute pancreatitis, unspecified complication status    ED Discharge Orders          Ordered    amoxicillin -clavulanate (AUGMENTIN ) 875-125 MG tablet  Every 12 hours        11/20/24 0528               Lynnley Doddridge F, PA-C 11/20/24 0558    Palumbo, April, MD 11/20/24 [phone]  "

## 2024-11-19 NOTE — ED Triage Notes (Signed)
 Pt. Arrives for ETOH, auditory hallucinations and SI. Pt. States that he wants to go to [facility]. Also states that he feels a seizure coming on.

## 2024-11-20 ENCOUNTER — Other Ambulatory Visit (HOSPITAL_COMMUNITY): Payer: Self-pay

## 2024-11-20 ENCOUNTER — Emergency Department (HOSPITAL_COMMUNITY): Payer: MEDICAID

## 2024-11-20 LAB — URINE DRUG SCREEN
Amphetamines: NEGATIVE
Barbiturates: POSITIVE — AB
Benzodiazepines: POSITIVE — AB
Cocaine: NEGATIVE
Fentanyl: NEGATIVE
Methadone Scn, Ur: NEGATIVE
Opiates: NEGATIVE
Tetrahydrocannabinol: NEGATIVE

## 2024-11-20 MED ORDER — SODIUM CHLORIDE 0.9 % IV BOLUS
1000.0000 mL | Freq: Once | INTRAVENOUS | Status: AC
  Administered 2024-11-20: 1000 mL via INTRAVENOUS

## 2024-11-20 MED ORDER — AMOXICILLIN-POT CLAVULANATE 875-125 MG PO TABS
1.0000 | ORAL_TABLET | Freq: Two times a day (BID) | ORAL | 0 refills | Status: AC
  Filled 2024-11-20: qty 14, 7d supply, fill #0

## 2024-11-20 MED ORDER — KETOROLAC TROMETHAMINE 15 MG/ML IJ SOLN
15.0000 mg | Freq: Once | INTRAMUSCULAR | Status: AC
  Administered 2024-11-20: 15 mg via INTRAVENOUS
  Filled 2024-11-20: qty 1

## 2024-11-20 MED ORDER — AMOXICILLIN-POT CLAVULANATE 875-125 MG PO TABS
1.0000 | ORAL_TABLET | Freq: Once | ORAL | Status: AC
  Administered 2024-11-20: 1 via ORAL
  Filled 2024-11-20: qty 1

## 2024-11-20 MED ORDER — IOHEXOL 300 MG/ML  SOLN
100.0000 mL | Freq: Once | INTRAMUSCULAR | Status: AC | PRN
  Administered 2024-11-20: 100 mL via INTRAVENOUS

## 2024-11-20 NOTE — Discharge Instructions (Addendum)
 As discussed, your CT scan shows you possibly have pneumonia. Please begin taking augmentin  2x/day for 7 days. CT scan also shows that you have acute pancreatitis. Please review the attached resources for substance use treatment. Please follow up as desired. Please begin taking zofran  every 6 hours as needed for nausea, sent to pharmacy on file. Please return to ED with new symptoms.

## 2024-11-23 ENCOUNTER — Telehealth: Payer: Self-pay

## 2024-11-23 NOTE — Telephone Encounter (Signed)
 [facility] called stating pt currently admitted due to intoxication. VL on the 13th was VL [id number] and CD4 [id number].  HIV coordinator will discuss with in patient providers about restarting medication. Pt is currently staying at homeless shelter in [facility].  Suggested they ask pt if he would like to continue with care in [facility] with [facility] or if he is able to come to [place] can continue care with [facility]. Coordinator will discuss this with patient tomorrow. Will keep 1/19 appt for now.  Lorenda CHRISTELLA Code, RMA

## 2024-11-27 ENCOUNTER — Ambulatory Visit: Payer: MEDICAID | Admitting: Internal Medicine

## 2024-12-03 ENCOUNTER — Other Ambulatory Visit (HOSPITAL_COMMUNITY): Payer: Self-pay
